# Patient Record
Sex: Female | Born: 1945 | Race: Black or African American | Hispanic: No | Smoking: Former smoker
Health system: Southern US, Community
[De-identification: ages and names within clinical notes are randomized; demographics above are authoritative.]

## PROBLEM LIST (undated history)

## (undated) DIAGNOSIS — G4730 Sleep apnea, unspecified: Secondary | ICD-10-CM

## (undated) DIAGNOSIS — Z8616 Personal history of COVID-19: Secondary | ICD-10-CM

## (undated) DIAGNOSIS — J9610 Chronic respiratory failure, unspecified whether with hypoxia or hypercapnia: Secondary | ICD-10-CM

## (undated) DIAGNOSIS — I330 Acute and subacute infective endocarditis: Secondary | ICD-10-CM

## (undated) DIAGNOSIS — I639 Cerebral infarction, unspecified: Secondary | ICD-10-CM

## (undated) DIAGNOSIS — I2510 Atherosclerotic heart disease of native coronary artery without angina pectoris: Secondary | ICD-10-CM

## (undated) DIAGNOSIS — I5042 Chronic combined systolic (congestive) and diastolic (congestive) heart failure: Secondary | ICD-10-CM

## (undated) DIAGNOSIS — E785 Hyperlipidemia, unspecified: Secondary | ICD-10-CM

## (undated) DIAGNOSIS — I1310 Hypertensive heart and chronic kidney disease without heart failure, with stage 1 through stage 4 chronic kidney disease, or unspecified chronic kidney disease: Secondary | ICD-10-CM

## (undated) DIAGNOSIS — Z8679 Personal history of other diseases of the circulatory system: Secondary | ICD-10-CM

## (undated) DIAGNOSIS — E119 Type 2 diabetes mellitus without complications: Secondary | ICD-10-CM

## (undated) DIAGNOSIS — A419 Sepsis, unspecified organism: Secondary | ICD-10-CM

## (undated) DIAGNOSIS — R32 Unspecified urinary incontinence: Secondary | ICD-10-CM

## (undated) DIAGNOSIS — G8929 Other chronic pain: Secondary | ICD-10-CM

## (undated) DIAGNOSIS — J849 Interstitial pulmonary disease, unspecified: Secondary | ICD-10-CM

## (undated) DIAGNOSIS — R079 Chest pain, unspecified: Secondary | ICD-10-CM

## (undated) DIAGNOSIS — I428 Other cardiomyopathies: Secondary | ICD-10-CM

## (undated) DIAGNOSIS — E669 Obesity, unspecified: Secondary | ICD-10-CM

## (undated) DIAGNOSIS — B955 Unspecified streptococcus as the cause of diseases classified elsewhere: Secondary | ICD-10-CM

## (undated) DIAGNOSIS — I890 Lymphedema, not elsewhere classified: Secondary | ICD-10-CM

## (undated) DIAGNOSIS — M1990 Unspecified osteoarthritis, unspecified site: Secondary | ICD-10-CM

## (undated) DIAGNOSIS — J449 Chronic obstructive pulmonary disease, unspecified: Secondary | ICD-10-CM

## (undated) DIAGNOSIS — I2721 Secondary pulmonary arterial hypertension: Secondary | ICD-10-CM

## (undated) DIAGNOSIS — R6520 Severe sepsis without septic shock: Secondary | ICD-10-CM

## (undated) DIAGNOSIS — I10 Essential (primary) hypertension: Secondary | ICD-10-CM

## (undated) DIAGNOSIS — K2970 Gastritis, unspecified, without bleeding: Secondary | ICD-10-CM

## (undated) DIAGNOSIS — I4819 Other persistent atrial fibrillation: Secondary | ICD-10-CM

## (undated) DIAGNOSIS — E041 Nontoxic single thyroid nodule: Secondary | ICD-10-CM

## (undated) DIAGNOSIS — R001 Bradycardia, unspecified: Secondary | ICD-10-CM

## (undated) DIAGNOSIS — G9341 Metabolic encephalopathy: Secondary | ICD-10-CM

## (undated) DIAGNOSIS — M549 Dorsalgia, unspecified: Secondary | ICD-10-CM

## (undated) DIAGNOSIS — R7881 Bacteremia: Secondary | ICD-10-CM

## (undated) DIAGNOSIS — D171 Benign lipomatous neoplasm of skin and subcutaneous tissue of trunk: Secondary | ICD-10-CM

## (undated) HISTORY — DX: Unspecified streptococcus as the cause of diseases classified elsewhere: B95.5

## (undated) HISTORY — DX: Other cardiomyopathies: I42.8

## (undated) HISTORY — DX: Chronic combined systolic (congestive) and diastolic (congestive) heart failure: I50.42

## (undated) HISTORY — DX: Nontoxic single thyroid nodule: E04.1

## (undated) HISTORY — PX: INSERT / REPLACE / REMOVE PACEMAKER: SUR710

## (undated) HISTORY — DX: Type 2 diabetes mellitus without complications: E11.9

## (undated) HISTORY — DX: Benign lipomatous neoplasm of skin and subcutaneous tissue of trunk: D17.1

## (undated) HISTORY — DX: Personal history of other diseases of the circulatory system: Z86.79

## (undated) HISTORY — DX: Gastritis, unspecified, without bleeding: K29.70

## (undated) HISTORY — DX: Other chronic pain: G89.29

## (undated) HISTORY — DX: Hypertensive heart and chronic kidney disease without heart failure, with stage 1 through stage 4 chronic kidney disease, or unspecified chronic kidney disease: I13.10

## (undated) HISTORY — DX: Interstitial pulmonary disease, unspecified: J84.9

## (undated) HISTORY — PX: TRIGGER FINGER RELEASE: SHX641

## (undated) HISTORY — DX: Chest pain, unspecified: R07.9

## (undated) HISTORY — DX: Dorsalgia, unspecified: M54.9

## (undated) HISTORY — DX: Hyperlipidemia, unspecified: E78.5

## (undated) HISTORY — DX: Personal history of COVID-19: Z86.16

## (undated) HISTORY — DX: Secondary pulmonary arterial hypertension: I27.21

## (undated) HISTORY — DX: Unspecified urinary incontinence: R32

## (undated) HISTORY — DX: Lymphedema, not elsewhere classified: I89.0

## (undated) HISTORY — DX: Chronic obstructive pulmonary disease, unspecified: J44.9

## (undated) HISTORY — PX: VAGINAL HYSTERECTOMY: SHX2639

## (undated) HISTORY — DX: Cerebral infarction, unspecified: I63.9

## (undated) HISTORY — DX: Obesity, unspecified: E66.9

## (undated) HISTORY — DX: Sleep apnea, unspecified: G47.30

## (undated) HISTORY — DX: Acute and subacute infective endocarditis: I33.0

## (undated) HISTORY — DX: Bacteremia: R78.81

## (undated) HISTORY — DX: Metabolic encephalopathy: R65.20

## (undated) HISTORY — DX: Unspecified osteoarthritis, unspecified site: M19.90

## (undated) HISTORY — DX: Atherosclerotic heart disease of native coronary artery without angina pectoris: I25.10

## (undated) HISTORY — PX: CHOLECYSTECTOMY: SHX55

## (undated) HISTORY — DX: Sepsis, unspecified organism: G93.41

## (undated) HISTORY — DX: Essential (primary) hypertension: I10

## (undated) HISTORY — DX: Sepsis, unspecified organism: A41.9

---

## 1967-06-24 HISTORY — PX: ABDOMINAL HYSTERECTOMY: SHX81

## 1998-12-29 LAB — HM COLONOSCOPY

## 2000-06-23 HISTORY — PX: CYST REMOVAL TRUNK: SHX6283

## 2002-06-23 LAB — HM MAMMOGRAPHY

## 2006-01-28 ENCOUNTER — Ambulatory Visit: Payer: Self-pay | Admitting: Family Medicine

## 2006-08-09 ENCOUNTER — Emergency Department: Payer: Self-pay | Admitting: Emergency Medicine

## 2006-08-09 ENCOUNTER — Other Ambulatory Visit: Payer: Self-pay

## 2008-07-21 ENCOUNTER — Ambulatory Visit: Payer: Self-pay | Admitting: Surgery

## 2011-06-24 HISTORY — PX: CARDIAC CATHETERIZATION: SHX172

## 2011-06-24 LAB — HM COLONOSCOPY

## 2012-01-15 ENCOUNTER — Encounter: Payer: Self-pay | Admitting: Cardiovascular Disease

## 2012-01-22 ENCOUNTER — Encounter: Payer: Self-pay | Admitting: Cardiovascular Disease

## 2012-01-22 ENCOUNTER — Ambulatory Visit (INDEPENDENT_AMBULATORY_CARE_PROVIDER_SITE_OTHER): Payer: Medicare Other | Admitting: Cardiovascular Disease

## 2012-01-22 VITALS — BP 152/78 | HR 68 | Ht 61.0 in | Wt 219.5 lb

## 2012-01-22 DIAGNOSIS — R609 Edema, unspecified: Secondary | ICD-10-CM | POA: Insufficient documentation

## 2012-01-22 DIAGNOSIS — I209 Angina pectoris, unspecified: Secondary | ICD-10-CM

## 2012-01-22 DIAGNOSIS — R079 Chest pain, unspecified: Secondary | ICD-10-CM

## 2012-01-22 DIAGNOSIS — I509 Heart failure, unspecified: Secondary | ICD-10-CM

## 2012-01-22 DIAGNOSIS — R0602 Shortness of breath: Secondary | ICD-10-CM

## 2012-01-22 DIAGNOSIS — E785 Hyperlipidemia, unspecified: Secondary | ICD-10-CM | POA: Insufficient documentation

## 2012-01-22 DIAGNOSIS — I10 Essential (primary) hypertension: Secondary | ICD-10-CM | POA: Insufficient documentation

## 2012-01-22 DIAGNOSIS — I5022 Chronic systolic (congestive) heart failure: Secondary | ICD-10-CM | POA: Insufficient documentation

## 2012-01-22 DIAGNOSIS — I208 Other forms of angina pectoris: Secondary | ICD-10-CM | POA: Insufficient documentation

## 2012-01-22 DIAGNOSIS — I2089 Other forms of angina pectoris: Secondary | ICD-10-CM | POA: Insufficient documentation

## 2012-01-22 MED ORDER — CARVEDILOL 6.25 MG PO TABS: 6.2500 mg | ORAL_TABLET | Freq: Two times a day (BID) | ORAL | Status: DC

## 2012-01-22 NOTE — Assessment & Plan Note (Signed)
The patient has unilateral nonpitting edema in the left leg with multiple negative ultrasound evaluation for DVT. I suspect that this is likely due to lymphedema.

## 2012-01-22 NOTE — Assessment & Plan Note (Addendum)
The patient reports previous history of congestive heart failure. She does have symptoms suggestive of fluid overload. I will obtain an echocardiogram before the cardiac catheterization to see if a right heart catheterization is needed at the same time. She appears to be fluid overloaded but will hold off on aggressive diuresis for now until after cardiac catheterization.

## 2012-01-22 NOTE — Assessment & Plan Note (Signed)
The patient reports progressive symptoms of chest tightness and dyspnea which is currently happening with minimal activities. This is suggestive of class III angina which has been progressive. She is not able to undergo stress testing Due to severe arthritis and limited mobility. She is not able to have a pharmacologic nuclear stress test which according to her almost "killed her" last time she had it.  I think the best option is proceeding with cardiac catheterization and possible coronary intervention. Risks, benefits and alternatives were discussed with the patient.

## 2012-01-22 NOTE — Progress Notes (Signed)
HPI  This is a 66 year old female who was referred by Dr. Lutricia Feil for preoperative cardiovascular evaluation for a routine screening colonoscopy. The patient reports previous history of congestive heart failure diagnosed in 2005. At that time she was evaluated and treated at [facility]. She did not continue her followup there due to loss of medical insurance. She was told at that time that her heart function was weak. She had cardiac catheterization few years before that and was told that there was no significant "blockages". She has multiple chronic medical conditions that include hypertension, type 2 diabetes, sleep apnea and obesity. She reports chronic left lower extremity swelling which has been evaluated in the past with venous duplex ultrasound on multiple occasions and was negative for DVT. She reports symptoms of severe dyspnea and chest tightness with minimal activities even walking 20 yards on flat level. She cannot go up 5 steps without having to stop and rest. She's been having these symptoms for the last 2 years but has definitely progressed recently. She also reports orthopnea with associated chest tightness when she is lying down. She's been having headache and dizziness and her blood pressure has been elevated.  Allergies  Allergen Reactions  . Penicillins      Current Outpatient Prescriptions on File Prior to Visit  Medication Sig Dispense Refill  . Aclidinium Bromide (TUDORZA PRESSAIR) 400 MCG/ACT AEPB Inhale into the lungs as needed.      [name] albuterol (PROVENTIL HFA;VENTOLIN HFA) 108 (90 BASE) MCG/ACT inhaler Inhale 2 puffs into the lungs as needed.      [name] aspirin 81 MG tablet Take 81 mg by mouth daily.      . enalapril (VASOTEC) 20 MG tablet Take 20 mg by mouth daily.      . meloxicam (MOBIC) 7.5 MG tablet Take 7.5 mg by mouth as needed.       . metFORMIN (GLUMETZA) 500 MG (MOD) 24 hr tablet Takes 2 tablets twice daily.      . roflumilast (DALIRESP) 500  MCG TABS tablet Take 500 mcg by mouth as needed.       . simvastatin (ZOCOR) 20 MG tablet Take 20 mg by mouth daily.      . carvedilol (COREG) 6.25 MG tablet Take 1 tablet (6.25 mg total) by mouth 2 (two) times daily.  60 tablet  6     Past Medical History  Diagnosis Date  . COPD (chronic obstructive pulmonary disease)   . Thyroid disorder   . Diabetes mellitus     type II  . Thyroid nodule   . CHF (congestive heart failure)   . Sleep apnea   . Obesity   . Hypertension   . Hyperlipidemia      Past Surgical History  Procedure Date  . Cholecystectomy   . Vaginal hysterectomy   . Cardiac catheterization 2002-3    @ [facility];[place]     History reviewed. No pertinent family history.   History   Social History  . Marital Status: Divorced    Spouse Name: N/A    Number of Children: N/A  . Years of Education: N/A   Occupational History  . Not on file.   Social History Main Topics  . Smoking status: Never Smoker   . Smokeless tobacco: Not on file  . Alcohol Use: No  . Drug Use: No  . Sexually Active:    Other Topics Concern  . Not on file   Social History Narrative  . No  narrative on file     ROS Constitutional: Negative for fever, chills, diaphoresis. HENT: Negative for hearing loss, nosebleeds, congestion, sore throat, facial swelling, drooling, trouble swallowing, neck pain, voice change, sinus pressure and tinnitus.  Eyes: Negative for photophobia, pain, discharge and visual disturbance.  Respiratory: Negative for apnea, cough, wheezing.  Cardiovascular: Negative for palpitations . Gastrointestinal: Negative for nausea, vomiting, abdominal pain, diarrhea, constipation, blood in stool and abdominal distention.  Genitourinary: Negative for dysuria, urgency, frequency, hematuria and decreased urine volume.  Skin: Negative for color change, pallor, rash and wound.  Neurological: Negative for dizziness, tremors, seizures, syncope, speech difficulty, weakness,  light-headedness, numbness and headaches.  Psychiatric/Behavioral: Negative for suicidal ideas, hallucinations, behavioral problems and agitation. The patient is not nervous/anxious.     PHYSICAL EXAM   BP 152/78  Pulse 68  Ht 5\' 1"  (1.549 m)  Wt 219 lb 8 oz (99.565 kg)  BMI 41.47 kg/m2 Constitutional: She is oriented to person, place, and time. She appears well-developed and well-nourished. No distress.  HENT: No nasal discharge.  Head: Normocephalic and atraumatic.  Eyes: Pupils are equal and round. Right eye exhibits no discharge. Left eye exhibits no discharge.  Neck: Normal range of motion. Neck supple. No JVD present. No thyromegaly present.  Cardiovascular: Normal rate, regular rhythm, normal heart sounds. Exam reveals no gallop and no friction rub. No murmur heard.  Pulmonary/Chest: Effort normal and breath sounds normal. No stridor. No respiratory distress. She has no wheezes. She has no rales. She exhibits no tenderness.  Abdominal: Soft. Bowel sounds are normal. She exhibits no distension. There is no tenderness. There is no rebound and no guarding.  Musculoskeletal: Normal range of motion. She exhibits significant edema in the left leg which is mostly nonpitting. No significant edema on the right side.  Neurological: She is alert and oriented to person, place, and time. Coordination normal.  Skin: Skin is warm and dry. No rash noted. She is not diaphoretic. No erythema. No pallor.  Psychiatric: She has a normal mood and affect. Her behavior is normal. Judgment and thought content normal.     EKG: Sinus  Rhythm  - occasional ectopic ventricular beat    -Nonspecific ST depression   +   Nonspecific T-abnormality  -Nondiagnostic.     ASSESSMENT AND PLAN

## 2012-01-22 NOTE — Patient Instructions (Addendum)
Start Coreg 6.25 mg twice daily for high blood pressure.   Your physician has requested that you have an echocardiogram. Echocardiography is a painless test that uses sound waves to create images of your heart. It provides your doctor with information about the size and shape of your heart and how well your heart's chambers and valves are working. This procedure takes approximately one hour. There are no restrictions for this procedure.  Your physician has requested that you have a cardiac catheterization. Cardiac catheterization is used to diagnose and/or treat various heart conditions. Doctors may recommend this procedure for a number of different reasons. The most common reason is to evaluate chest pain. Chest pain can be a symptom of coronary artery disease (CAD), and cardiac catheterization can show whether plaque is narrowing or blocking your heart's arteries. This procedure is also used to evaluate the valves, as well as measure the blood flow and oxygen levels in different parts of your heart. For further information please visit [email or web address]. Please follow instruction sheet, as given.  Schedule with Dr. Derrel Nip or Dr. Gilford Rile as new PCP.

## 2012-01-22 NOTE — Assessment & Plan Note (Addendum)
Her blood pressure is elevated. I will add carvedilol 6.25 mg twice daily. The patient requested referral to a new primary care physician and she was given information upon her request.

## 2012-01-23 ENCOUNTER — Other Ambulatory Visit: Payer: Self-pay

## 2012-01-23 ENCOUNTER — Other Ambulatory Visit: Payer: Self-pay | Admitting: Cardiovascular Disease

## 2012-01-23 DIAGNOSIS — R0602 Shortness of breath: Secondary | ICD-10-CM

## 2012-01-23 DIAGNOSIS — R079 Chest pain, unspecified: Secondary | ICD-10-CM

## 2012-01-23 LAB — BASIC METABOLIC PANEL
BUN/Creatinine Ratio: 19 (ref 11–26)
BUN: 11 mg/dL (ref 8–27)
CO2: 25 mmol/L (ref 19–28)
Calcium: 9 mg/dL (ref 8.6–10.2)
Chloride: 100 mmol/L (ref 97–108)
Creatinine, Ser: 0.59 mg/dL (ref 0.57–1.00)
GFR calc Af Amer: 109 mL/min/{1.73_m2} (ref >59)
GFR calc non Af Amer: 94 mL/min/{1.73_m2} (ref >59)
Glucose: 177 mg/dL — ABNORMAL HIGH (ref 65–99)
Potassium: 3 mmol/L — ABNORMAL LOW (ref 3.5–5.2)
Sodium: 139 mmol/L (ref 134–144)

## 2012-01-23 LAB — CBC WITH DIFFERENTIAL/PLATELET
Basophils Absolute: 0 10*3/uL (ref 0.0–0.2)
Basos: 1 % (ref 0–3)
Eos: 1 % (ref 0–7)
Eosinophils Absolute: 0.1 10*3/uL (ref 0.0–0.4)
HCT: 37.6 % (ref 34.0–46.6)
Hemoglobin: 12.1 g/dL (ref 11.1–15.9)
Immature Grans (Abs): 0 10*3/uL (ref 0.0–0.1)
Immature Granulocytes: 0 % (ref 0–2)
Lymphocytes Absolute: 1.7 10*3/uL (ref 0.7–4.5)
Lymphs: 27 % (ref 14–46)
MCH: 27.1 pg (ref 26.6–33.0)
MCHC: 32.2 g/dL (ref 31.5–35.7)
MCV: 84 fL (ref 79–97)
Monocytes Absolute: 0.3 10*3/uL (ref 0.1–1.0)
Monocytes: 5 % (ref 4–13)
Neutrophils Absolute: 4 10*3/uL (ref 1.8–7.8)
Neutrophils Relative %: 66 % (ref 40–74)
RBC: 4.47 x10E6/uL (ref 3.77–5.28)
RDW: 13.9 % (ref 12.3–15.4)
WBC: 6.1 10*3/uL (ref 4.0–10.5)

## 2012-01-23 LAB — PROTIME-INR
INR: 1 (ref 0.8–1.2)
Prothrombin Time: 10.4 s (ref 9.1–12.0)

## 2012-01-23 MED ORDER — POTASSIUM CHLORIDE CRYS ER 20 MEQ PO TBCR: 20.0000 meq | EXTENDED_RELEASE_TABLET | Freq: Every day | ORAL | Status: DC

## 2012-01-26 ENCOUNTER — Other Ambulatory Visit: Payer: Self-pay

## 2012-01-26 ENCOUNTER — Other Ambulatory Visit (INDEPENDENT_AMBULATORY_CARE_PROVIDER_SITE_OTHER): Payer: Medicare Other

## 2012-01-26 DIAGNOSIS — R072 Precordial pain: Secondary | ICD-10-CM

## 2012-01-26 DIAGNOSIS — R0989 Other specified symptoms and signs involving the circulatory and respiratory systems: Secondary | ICD-10-CM

## 2012-01-26 DIAGNOSIS — R0609 Other forms of dyspnea: Secondary | ICD-10-CM

## 2012-01-26 DIAGNOSIS — R0602 Shortness of breath: Secondary | ICD-10-CM

## 2012-01-27 ENCOUNTER — Ambulatory Visit: Payer: Self-pay | Admitting: Cardiovascular Disease

## 2012-01-27 DIAGNOSIS — R0602 Shortness of breath: Secondary | ICD-10-CM

## 2012-01-28 ENCOUNTER — Encounter: Payer: Self-pay | Admitting: Cardiovascular Disease

## 2012-02-05 ENCOUNTER — Ambulatory Visit (INDEPENDENT_AMBULATORY_CARE_PROVIDER_SITE_OTHER): Payer: Medicare Other | Admitting: Cardiovascular Disease

## 2012-02-05 ENCOUNTER — Encounter: Payer: Self-pay | Admitting: Cardiovascular Disease

## 2012-02-05 VITALS — BP 170/92 | HR 62 | Ht 61.0 in | Wt 221.5 lb

## 2012-02-05 DIAGNOSIS — I208 Other forms of angina pectoris: Secondary | ICD-10-CM

## 2012-02-05 DIAGNOSIS — I209 Angina pectoris, unspecified: Secondary | ICD-10-CM

## 2012-02-05 DIAGNOSIS — I10 Essential (primary) hypertension: Secondary | ICD-10-CM

## 2012-02-05 DIAGNOSIS — R609 Edema, unspecified: Secondary | ICD-10-CM

## 2012-02-05 DIAGNOSIS — I509 Heart failure, unspecified: Secondary | ICD-10-CM

## 2012-02-05 MED ORDER — ENALAPRIL MALEATE 20 MG PO TABS: 20.0000 mg | ORAL_TABLET | Freq: Two times a day (BID) | ORAL | Status: DC

## 2012-02-05 MED ORDER — SPIRONOLACTONE 25 MG PO TABS: 25.0000 mg | ORAL_TABLET | Freq: Every day | ORAL | Status: DC

## 2012-02-05 NOTE — Assessment & Plan Note (Signed)
Cardiac catheterization showed no significant obstructive coronary artery disease. I suspect that her symptoms are related to severe diastolic dysfunction with elevated filling pressures. Once her blood pressure is controlled, she can undergo colonoscopy at an overall low risk.

## 2012-02-05 NOTE — Assessment & Plan Note (Signed)
The patient has unilateral nonpitting edema in the left leg with multiple negative ultrasound evaluation for DVT. I suspect that this is likely due to lymphedema.

## 2012-02-05 NOTE — Assessment & Plan Note (Signed)
This is due to diastolic dysfunction with moderate to severe pulmonary hypertension likely related to prolonged history of systemic hypertension. I recommend adding spironolactone 25 mg once daily. If her blood pressure remains elevated, a thiazide diuretic can be considered.

## 2012-02-05 NOTE — Assessment & Plan Note (Addendum)
Her blood pressure is still very elevated. I recommend increasing enalapril to twice daily and adding spironolactone as outlined above. She will need an office visit in one week to recheck her blood pressure and will also need basic metabolic profile.

## 2012-02-05 NOTE — Progress Notes (Signed)
HPI  This is a 66 year old female who was here today for a followup visit. She was referred by Dr. Lutricia Feil for preoperative cardiovascular evaluation for a routine screening colonoscopy.  She has chronic left lower extremity swelling which has been evaluated in the past with venous duplex ultrasound on multiple occasions and was negative for DVT. She reported symptoms of severe dyspnea and chest tightness with minimal activities even walking 20 yards on flat level. She cannot go up 5 steps without having to stop and rest. She's been having these symptoms for the last 2 years but has definitely progressed recently. She also reports orthopnea with associated chest tightness when she is lying down. She had an echocardiogram done which showed normal LV systolic function with mild diastolic dysfunction and evidence of elevated filling pressures. There was also moderate to severe pulmonary hypertension with peak systolic pressure of 56 mm Hg. she underwent cardiac catheterization which showed no evidence of obstructive coronary artery disease. She was noted to be severely hypertensive with significant elevation in left ventricular end-diastolic pressure. She was noted to be hypokalemic with a potassium of 3 on her precath labs.  Allergies  Allergen Reactions  . Penicillins      Current Outpatient Prescriptions on File Prior to Visit  Medication Sig Dispense Refill  . Aclidinium Bromide (TUDORZA PRESSAIR) 400 MCG/ACT AEPB Inhale into the lungs as needed.      [name] albuterol (PROVENTIL HFA;VENTOLIN HFA) 108 (90 BASE) MCG/ACT inhaler Inhale 2 puffs into the lungs as needed.      [name] aspirin 81 MG tablet Take 81 mg by mouth daily.      . carvedilol (COREG) 6.25 MG tablet Take 1 tablet (6.25 mg total) by mouth 2 (two) times daily.  60 tablet  6  . meloxicam (MOBIC) 7.5 MG tablet Take 7.5 mg by mouth as needed.       . metFORMIN (GLUMETZA) 500 MG (MOD) 24 hr tablet Takes 2 tablets twice daily.      . NON  FORMULARY Oxygen 2 liters.      . NON FORMULARY CPAP      . roflumilast (DALIRESP) 500 MCG TABS tablet Take 500 mcg by mouth as needed.       . simvastatin (ZOCOR) 20 MG tablet Take 20 mg by mouth daily.      [name] DISCONTD: enalapril (VASOTEC) 20 MG tablet Take 20 mg by mouth daily.      [name] spironolactone (ALDACTONE) 25 MG tablet Take 1 tablet (25 mg total) by mouth daily.  30 tablet  6     Past Medical History  Diagnosis Date  . COPD (chronic obstructive pulmonary disease)   . Thyroid disorder   . Diabetes mellitus     type II  . Thyroid nodule   . CHF (congestive heart failure)   . Sleep apnea   . Obesity   . Hypertension   . Hyperlipidemia      Past Surgical History  Procedure Date  . Cholecystectomy   . Vaginal hysterectomy   . Cardiac catheterization 2013    @ [place]: No obstructive CAD: Only 20% ostial left circumflex.     History reviewed. No pertinent family history.   History   Social History  . Marital Status: Divorced    Spouse Name: N/A    Number of Children: N/A  . Years of Education: N/A   Occupational History  . Not on file.   Social History Main Topics  . Smoking status:  Never Smoker   . Smokeless tobacco: Not on file  . Alcohol Use: No  . Drug Use: No  . Sexually Active:    Other Topics Concern  . Not on file   Social History Narrative  . No narrative on file     PHYSICAL EXAM   BP 170/92  Pulse 62  Ht 5\' 1"  (1.549 m)  Wt 221 lb 8 oz (100.472 kg)  BMI 41.85 kg/m2  SpO2 98%  Constitutional: He is oriented to person, place, and time. He appears well-developed and well-nourished. No distress.  HENT: No nasal discharge.  Head: Normocephalic and atraumatic.  Eyes: Pupils are equal and round. Right eye exhibits no discharge. Left eye exhibits no discharge.  Neck: Normal range of motion. Neck supple. No JVD present. No thyromegaly present.  Cardiovascular: Normal rate, regular rhythm, normal heart sounds and. Exam reveals no gallop and  no friction rub. There is 2/6 systolic ejection murmur at the aortic area. Pulmonary/Chest: Effort normal and breath sounds normal. No stridor. No respiratory distress. He has no wheezes. He has no rales. He exhibits no tenderness.  Abdominal: Soft. Bowel sounds are normal. He exhibits no distension. There is no tenderness. There is no rebound and no guarding.  Musculoskeletal: Normal range of motion. He exhibits chronic +3 nonpitting edema on the left side.   Neurological: He is alert and oriented to person, place, and time. Coordination normal.  Skin: Skin is warm and dry. No rash noted. He is not diaphoretic. No erythema. No pallor.  Psychiatric: He has a normal mood and affect. His behavior is normal. Judgment and thought content normal.     EKG: Sinus  Rhythm  -  Nonspecific T-abnormality.    ASSESSMENT AND PLAN

## 2012-02-05 NOTE — Patient Instructions (Addendum)
Change Enalapril to 20 mg twice daily.  Start Spironolactone 25 mg once daily.  Follow up in 1 week.

## 2012-02-13 ENCOUNTER — Encounter: Payer: Self-pay | Admitting: Cardiovascular Disease

## 2012-02-13 ENCOUNTER — Ambulatory Visit (INDEPENDENT_AMBULATORY_CARE_PROVIDER_SITE_OTHER): Payer: Medicare Other | Admitting: Cardiovascular Disease

## 2012-02-13 VITALS — BP 160/90 | HR 76 | Ht 61.0 in | Wt 221.5 lb

## 2012-02-13 DIAGNOSIS — I10 Essential (primary) hypertension: Secondary | ICD-10-CM

## 2012-02-13 DIAGNOSIS — R0789 Other chest pain: Secondary | ICD-10-CM

## 2012-02-13 DIAGNOSIS — I509 Heart failure, unspecified: Secondary | ICD-10-CM

## 2012-02-13 MED ORDER — FUROSEMIDE 20 MG PO TABS: 20.0000 mg | ORAL_TABLET | Freq: Two times a day (BID) | ORAL | Status: DC

## 2012-02-13 NOTE — Patient Instructions (Addendum)
Start Furosemide (Lasix)  20 mg twice daily . This is a fluid medication. Take in early afternoon around 3 pm.  Continue other medications.  Labs to be done in 1 week.  Follow up in 1 month.

## 2012-02-13 NOTE — Progress Notes (Signed)
HPI  This is a 66 year old female who was here today for a followup visit. She was referred by Dr. Lutricia Feil for preoperative cardiovascular evaluation for a routine screening colonoscopy. She has chronic left lower extremity swelling which has been evaluated in the past with venous duplex ultrasound on multiple occasions and was negative for DVT. She reported symptoms of severe dyspnea and chest tightness with minimal activities even walking 20 yards on flat level. She cannot go up 5 steps without having to stop and rest. She's been having these symptoms for the last 2 years but has definitely progressed recently. She also reports orthopnea with associated chest tightness when she is lying down. She had an echocardiogram done which showed normal LV systolic function with mild diastolic dysfunction and evidence of elevated filling pressures. There was also moderate to severe pulmonary hypertension with peak systolic pressure of 56 mm Hg. she underwent cardiac catheterization which showed no evidence of obstructive coronary artery disease. She was noted to be severely hypertensive with significant elevation in left ventricular end-diastolic pressure. She was noted to be hypokalemic with a potassium of 3 on her precath labs. During her last visit, spironolactone 25 mg once daily was added. She still feels short of breath with dizziness. Also with substernal chest tightness with activities.   Allergies  Allergen Reactions  . Penicillins      Current Outpatient Prescriptions on File Prior to Visit  Medication Sig Dispense Refill  . Aclidinium Bromide (TUDORZA PRESSAIR) 400 MCG/ACT AEPB Inhale into the lungs as needed.      [name] albuterol (PROVENTIL HFA;VENTOLIN HFA) 108 (90 BASE) MCG/ACT inhaler Inhale 2 puffs into the lungs as needed.      [name] aspirin 81 MG tablet Take 81 mg by mouth daily.      . carvedilol (COREG) 6.25 MG tablet Take 1 tablet (6.25 mg total) by mouth 2 (two) times daily.  60 tablet  6    . enalapril (VASOTEC) 20 MG tablet Take 1 tablet (20 mg total) by mouth 2 (two) times daily.  60 tablet  6  . meloxicam (MOBIC) 7.5 MG tablet Take 7.5 mg by mouth as needed.       . metFORMIN (GLUMETZA) 500 MG (MOD) 24 hr tablet Takes 2 tablets twice daily.      . NON FORMULARY Oxygen 2 liters.      . NON FORMULARY CPAP      . roflumilast (DALIRESP) 500 MCG TABS tablet Take 500 mcg by mouth as needed.       . simvastatin (ZOCOR) 20 MG tablet Take 20 mg by mouth daily.      [name] spironolactone (ALDACTONE) 25 MG tablet Take 1 tablet (25 mg total) by mouth daily.  30 tablet  6     Past Medical History  Diagnosis Date  . COPD (chronic obstructive pulmonary disease)   . Thyroid disorder   . Diabetes mellitus     type II  . Thyroid nodule   . CHF (congestive heart failure)   . Sleep apnea   . Obesity   . Hypertension   . Hyperlipidemia      Past Surgical History  Procedure Date  . Cholecystectomy   . Vaginal hysterectomy   . Cardiac catheterization 2013    @ [place]: No obstructive CAD: Only 20% ostial left circumflex.     History reviewed. No pertinent family history.   History   Social History  . Marital Status: Divorced    Spouse Name:  N/A    Number of Children: N/A  . Years of Education: N/A   Occupational History  . Not on file.   Social History Main Topics  . Smoking status: Never Smoker   . Smokeless tobacco: Not on file  . Alcohol Use: No  . Drug Use: No  . Sexually Active:    Other Topics Concern  . Not on file   Social History Narrative  . No narrative on file     PHYSICAL EXAM   BP 160/90  Pulse 76  Ht 5\' 1"  (1.549 m)  Wt 221 lb 8 oz (100.472 kg)  BMI 41.85 kg/m2 Constitutional: She is oriented to person, place, and time. She appears well-developed and well-nourished. No distress.  HENT: No nasal discharge.  Head: Normocephalic and atraumatic.  Eyes: Pupils are equal and round. Right eye exhibits no discharge. Left eye exhibits no discharge.   Neck: Normal range of motion. Neck supple. No JVD present. No thyromegaly present.  Cardiovascular: Normal rate, regular rhythm, normal heart sounds. Exam reveals no gallop and no friction rub. No murmur heard.  Pulmonary/Chest: Effort normal and breath sounds normal. No stridor. No respiratory distress. She has no wheezes. She has no rales. She exhibits no tenderness.  Abdominal: Soft. Bowel sounds are normal. She exhibits no distension. There is no tenderness. There is no rebound and no guarding.  Musculoskeletal: Normal range of motion. She exhibits chronic left lower extremity edema and no tenderness.  Neurological: She is alert and oriented to person, place, and time. Coordination normal.  Skin: Skin is warm and dry. No rash noted. She is not diaphoretic. No erythema. No pallor.  Psychiatric: She has a normal mood and affect. Her behavior is normal. Judgment and thought content normal.     EKG: Normal sinus rhythm with nonspecific T wave changes. Heart rate is 64 beats per minute.   ASSESSMENT AND PLAN

## 2012-02-13 NOTE — Assessment & Plan Note (Signed)
This is due to diastolic dysfunction with moderate to severe pulmonary hypertension likely related to prolonged history of systemic hypertension. Continue spironolactone 25 mg once daily. I will add furosemide 20 mg twice daily. Check basic metabolic profile in one week.

## 2012-02-13 NOTE — Assessment & Plan Note (Signed)
Blood pressure is slightly better than last visit. Due to her complaints of dizziness, I will try to drink the blood pressure down very gradually. Hopefully, furosemide twice daily will help.

## 2012-02-19 ENCOUNTER — Other Ambulatory Visit (INDEPENDENT_AMBULATORY_CARE_PROVIDER_SITE_OTHER): Payer: Medicare Other

## 2012-02-19 DIAGNOSIS — I509 Heart failure, unspecified: Secondary | ICD-10-CM

## 2012-02-20 LAB — BASIC METABOLIC PANEL
BUN/Creatinine Ratio: 20 (ref 11–26)
BUN: 15 mg/dL (ref 8–27)
CO2: 24 mmol/L (ref 19–28)
Calcium: 8.9 mg/dL (ref 8.6–10.2)
Chloride: 100 mmol/L (ref 97–108)
Creatinine, Ser: 0.76 mg/dL (ref 0.57–1.00)
GFR calc Af Amer: 93 mL/min/{1.73_m2} (ref >59)
GFR calc non Af Amer: 81 mL/min/{1.73_m2} (ref >59)
Glucose: 116 mg/dL — ABNORMAL HIGH (ref 65–99)
Potassium: 3.8 mmol/L (ref 3.5–5.2)
Sodium: 140 mmol/L (ref 134–144)

## 2012-03-15 ENCOUNTER — Ambulatory Visit (INDEPENDENT_AMBULATORY_CARE_PROVIDER_SITE_OTHER): Payer: Medicare Other | Admitting: Cardiovascular Disease

## 2012-03-15 ENCOUNTER — Encounter: Payer: Self-pay | Admitting: Cardiovascular Disease

## 2012-03-15 VITALS — BP 170/90 | HR 64 | Ht 61.0 in | Wt 221.2 lb

## 2012-03-15 DIAGNOSIS — I209 Angina pectoris, unspecified: Secondary | ICD-10-CM

## 2012-03-15 DIAGNOSIS — I208 Other forms of angina pectoris: Secondary | ICD-10-CM

## 2012-03-15 DIAGNOSIS — I10 Essential (primary) hypertension: Secondary | ICD-10-CM

## 2012-03-15 DIAGNOSIS — I509 Heart failure, unspecified: Secondary | ICD-10-CM

## 2012-03-15 DIAGNOSIS — R0789 Other chest pain: Secondary | ICD-10-CM

## 2012-03-15 MED ORDER — ISOSORBIDE MONONITRATE ER 60 MG PO TB24: 60.0000 mg | ORAL_TABLET | Freq: Every day | ORAL | Status: DC

## 2012-03-15 MED ORDER — AMLODIPINE BESYLATE 5 MG PO TABS: 5.0000 mg | ORAL_TABLET | Freq: Every day | ORAL | Status: DC

## 2012-03-15 NOTE — Patient Instructions (Addendum)
Labs today.  Make sure you stick to low Sodium diet.   Start Amlodipine 5 mg once daily.  Start Imdur 60 mg once daily.  Follow up in 1 month.

## 2012-03-15 NOTE — Assessment & Plan Note (Signed)
Her blood pressure is still significantly high. I will add today Imdur 60 mg once daily and amlodipine 5 mg once daily. The patient might be a candidate for renal denervation in the future. I also discussed with her the importance of low sodium diet.

## 2012-03-15 NOTE — Progress Notes (Signed)
HPI  This is a 66 year old female who was here today for a followup visit regarding chronic diastolic heart failure with pulmonary hypertension. She has chronic left lower extremity swelling which has been evaluated in the past with venous duplex ultrasound on multiple occasions and was negative for DVT. She continues to have symptoms of severe dyspnea and chest tightness with minimal activities even walking 20 yards on flat level. She cannot go up 5 steps without having to stop and rest. She's been having these symptoms for the last 2 years but has definitely progressed recently. She also reports orthopnea with associated chest tightness when she is lying down. She had an echocardiogram done which showed normal LV systolic function with mild diastolic dysfunction and evidence of elevated filling pressures. There was also moderate to severe pulmonary hypertension with peak systolic pressure of 56 mm Hg. she underwent cardiac catheterization which showed no evidence of obstructive coronary artery disease. She was noted to be severely hypertensive with significant elevation in left ventricular end-diastolic pressure. She was noted to be hypokalemic with a potassium of 3 on her precath labs. Spironolactone 25 mg once daily was added. Lasix 20 mg twice daily was added during last visit. The patient reports frequent urination but there has been no significant change in her weight. Lower extremity edema though seems to be better. She has been having significant issues with exertional dyspnea, chest pain and fatigue. She feels that she is gasping for air. She is supposed to be on home oxygen but does not have a portable oxygen tank.     Allergies  Allergen Reactions  . Penicillins      Current Outpatient Prescriptions on File Prior to Visit  Medication Sig Dispense Refill  . Aclidinium Bromide (TUDORZA PRESSAIR) 400 MCG/ACT AEPB Inhale into the lungs as needed.      [name] albuterol (PROVENTIL HFA;VENTOLIN  HFA) 108 (90 BASE) MCG/ACT inhaler Inhale 2 puffs into the lungs as needed.      [name] aspirin 81 MG tablet Take 81 mg by mouth daily.      . carvedilol (COREG) 6.25 MG tablet Take 1 tablet (6.25 mg total) by mouth 2 (two) times daily.  60 tablet  6  . enalapril (VASOTEC) 20 MG tablet Take 1 tablet (20 mg total) by mouth 2 (two) times daily.  60 tablet  6  . furosemide (LASIX) 20 MG tablet Take 1 tablet (20 mg total) by mouth 2 (two) times daily.  60 tablet  6  . meloxicam (MOBIC) 7.5 MG tablet Take 7.5 mg by mouth as needed.       . metFORMIN (GLUMETZA) 500 MG (MOD) 24 hr tablet Takes 2 tablets twice daily.      . NON FORMULARY CPAP      . roflumilast (DALIRESP) 500 MCG TABS tablet Take 500 mcg by mouth as needed.       . simvastatin (ZOCOR) 20 MG tablet Take 20 mg by mouth daily.      [name] spironolactone (ALDACTONE) 25 MG tablet Take 1 tablet (25 mg total) by mouth daily.  30 tablet  6  . amLODipine (NORVASC) 5 MG tablet Take 1 tablet (5 mg total) by mouth daily.  30 tablet  6  . isosorbide mononitrate (IMDUR) 60 MG 24 hr tablet Take 1 tablet (60 mg total) by mouth daily.  30 tablet  6  . NON FORMULARY Oxygen 2 liters.         Past Medical History  Diagnosis Date  .  COPD (chronic obstructive pulmonary disease)   . Thyroid disorder   . Diabetes mellitus     type II  . Thyroid nodule   . CHF (congestive heart failure)   . Sleep apnea   . Obesity   . Hypertension   . Hyperlipidemia      Past Surgical History  Procedure Date  . Cholecystectomy   . Vaginal hysterectomy   . Cardiac catheterization 2013    @ [place]: No obstructive CAD: Only 20% ostial left circumflex.     History reviewed. No pertinent family history.   History   Social History  . Marital Status: Divorced    Spouse Name: N/A    Number of Children: N/A  . Years of Education: N/A   Occupational History  . Not on file.   Social History Main Topics  . Smoking status: Never Smoker   . Smokeless tobacco: Not on  file  . Alcohol Use: No  . Drug Use: No  . Sexually Active:    Other Topics Concern  . Not on file   Social History Narrative  . No narrative on file       PHYSICAL EXAM   BP 170/90  Pulse 64  Ht 5\' 1"  (1.549 m)  Wt 221 lb 4 oz (100.358 kg)  BMI 41.80 kg/m2 Constitutional: She is oriented to person, place, and time. She appears well-developed and well-nourished. No distress.  HENT: No nasal discharge.  Head: Normocephalic and atraumatic.  Eyes: Pupils are equal and round. Right eye exhibits no discharge. Left eye exhibits no discharge.  Neck: Normal range of motion. Neck supple. No JVD present. No thyromegaly present.  Cardiovascular: Normal rate, regular rhythm, normal heart sounds. Exam reveals no gallop and no friction rub. No murmur heard.  Pulmonary/Chest: Effort normal and breath sounds normal. No stridor. No respiratory distress. She has no wheezes. She has no rales. She exhibits no tenderness.  Abdominal: Soft. Bowel sounds are normal. She exhibits no distension. There is no tenderness. There is no rebound and no guarding.  Musculoskeletal: Normal range of motion. She exhibits chronic left lower extremity edema and no tenderness.  Neurological: She is alert and oriented to person, place, and time. Coordination normal.  Skin: Skin is warm and dry. No rash noted. She is not diaphoretic. No erythema. No pallor.  Psychiatric: She has a normal mood and affect. Her behavior is normal. Judgment and thought content normal.     EKG: Sinus  Rhythm  - frequent PAC s  # PACs = 2. Low voltage in precordial leads.   -  Nonspecific T-abnormality.   ABNORMAL    ASSESSMENT AND PLAN

## 2012-03-15 NOTE — Assessment & Plan Note (Signed)
Cardiac catheterization showed no significant coronary artery disease. Her symptoms seem to be due to chronic diastolic heart failure with pulmonary hypertension. I will add Imdur 60 mg once daily for symptomatic relief and blood pressure control.

## 2012-03-15 NOTE — Assessment & Plan Note (Signed)
The patient has severe diastolic heart failure with pulmonary hypertension and refractory fluid overload. She continues to be significantly symptomatic. Her oxygen saturation at rest was 97% which quickly dropped to 89% with walking less than 50 feet in our office. I believe Debra Saunders would benefit from continuous oxygen treatment. I will try to arrange for that. Continue current dose of Lasix and spironolactone. She is having some PACs and her EKG and thus I will check basic metabolic profile and magnesium level given that she is on 2 diuretics.

## 2012-03-16 ENCOUNTER — Telehealth: Payer: Self-pay

## 2012-03-16 LAB — BASIC METABOLIC PANEL
BUN/Creatinine Ratio: 28 — ABNORMAL HIGH (ref 11–26)
BUN: 18 mg/dL (ref 8–27)
CO2: 26 mmol/L (ref 19–28)
Calcium: 9.3 mg/dL (ref 8.6–10.2)
Chloride: 98 mmol/L (ref 97–108)
Creatinine, Ser: 0.65 mg/dL (ref 0.57–1.00)
GFR calc Af Amer: 105 mL/min/{1.73_m2} (ref >59)
GFR calc non Af Amer: 92 mL/min/{1.73_m2} (ref >59)
Glucose: 154 mg/dL — ABNORMAL HIGH (ref 65–99)
Potassium: 3.6 mmol/L (ref 3.5–5.2)
Sodium: 141 mmol/L (ref 134–144)

## 2012-03-16 LAB — MAGNESIUM: Magnesium: 1.4 mg/dL — ABNORMAL LOW (ref 1.6–2.6)

## 2012-03-16 NOTE — Telephone Encounter (Signed)
Faxed office note for documentation re: portable O2 to [facility] [phone]

## 2012-03-17 ENCOUNTER — Other Ambulatory Visit: Payer: Self-pay

## 2012-03-17 MED ORDER — MAGNESIUM OXIDE 400 MG PO TABS: 400.0000 mg | ORAL_TABLET | Freq: Two times a day (BID) | ORAL | Status: DC

## 2012-03-30 ENCOUNTER — Encounter: Payer: Self-pay | Admitting: Internal Medicine

## 2012-03-30 ENCOUNTER — Ambulatory Visit (INDEPENDENT_AMBULATORY_CARE_PROVIDER_SITE_OTHER): Payer: Medicare Other | Admitting: Internal Medicine

## 2012-03-30 VITALS — BP 122/80 | HR 56 | Temp 98.5°F | Ht 61.0 in | Wt 219.8 lb

## 2012-03-30 DIAGNOSIS — R600 Localized edema: Secondary | ICD-10-CM

## 2012-03-30 DIAGNOSIS — E039 Hypothyroidism, unspecified: Secondary | ICD-10-CM | POA: Insufficient documentation

## 2012-03-30 DIAGNOSIS — R82998 Other abnormal findings in urine: Secondary | ICD-10-CM

## 2012-03-30 DIAGNOSIS — I2789 Other specified pulmonary heart diseases: Secondary | ICD-10-CM

## 2012-03-30 DIAGNOSIS — Z1239 Encounter for other screening for malignant neoplasm of breast: Secondary | ICD-10-CM

## 2012-03-30 DIAGNOSIS — E785 Hyperlipidemia, unspecified: Secondary | ICD-10-CM

## 2012-03-30 DIAGNOSIS — I509 Heart failure, unspecified: Secondary | ICD-10-CM

## 2012-03-30 DIAGNOSIS — I10 Essential (primary) hypertension: Secondary | ICD-10-CM

## 2012-03-30 DIAGNOSIS — R61 Generalized hyperhidrosis: Secondary | ICD-10-CM

## 2012-03-30 DIAGNOSIS — R609 Edema, unspecified: Secondary | ICD-10-CM

## 2012-03-30 DIAGNOSIS — R820 Chyluria: Secondary | ICD-10-CM

## 2012-03-30 DIAGNOSIS — D649 Anemia, unspecified: Secondary | ICD-10-CM

## 2012-03-30 DIAGNOSIS — E119 Type 2 diabetes mellitus without complications: Secondary | ICD-10-CM | POA: Insufficient documentation

## 2012-03-30 DIAGNOSIS — I272 Pulmonary hypertension, unspecified: Secondary | ICD-10-CM | POA: Insufficient documentation

## 2012-03-30 LAB — POCT URINALYSIS DIPSTICK
Bilirubin, UA: NEGATIVE
Blood, UA: NEGATIVE
Glucose, UA: NEGATIVE
Leukocytes, UA: NEGATIVE
Nitrite, UA: NEGATIVE
Spec Grav, UA: 1.025
Urobilinogen, UA: 0.2
pH, UA: 5.5

## 2012-03-30 LAB — COMPREHENSIVE METABOLIC PANEL
ALT: 22 U/L (ref 0–35)
AST: 29 U/L (ref 0–37)
Albumin: 3.4 g/dL — ABNORMAL LOW (ref 3.5–5.2)
Alkaline Phosphatase: 78 U/L (ref 39–117)
BUN: 18 mg/dL (ref 6–23)
CO2: 26 mEq/L (ref 19–32)
Calcium: 8.7 mg/dL (ref 8.4–10.5)
Chloride: 101 mEq/L (ref 96–112)
Creatinine, Ser: 0.9 mg/dL (ref 0.4–1.2)
GFR: 83.09 mL/min (ref >60.00)
Glucose, Bld: 123 mg/dL — ABNORMAL HIGH (ref 70–99)
Potassium: 3.6 mEq/L (ref 3.5–5.1)
Sodium: 135 mEq/L (ref 135–145)
Total Bilirubin: 0.7 mg/dL (ref 0.3–1.2)
Total Protein: 7.7 g/dL (ref 6.0–8.3)

## 2012-03-30 LAB — CBC WITH DIFFERENTIAL/PLATELET
Basophils Absolute: 0 10*3/uL (ref 0.0–0.1)
Basophils Relative: 0.6 % (ref 0.0–3.0)
Eosinophils Absolute: 0.2 10*3/uL (ref 0.0–0.7)
Eosinophils Relative: 2.5 % (ref 0.0–5.0)
HCT: 33.6 % — ABNORMAL LOW (ref 36.0–46.0)
Hemoglobin: 10.5 g/dL — ABNORMAL LOW (ref 12.0–15.0)
Lymphocytes Relative: 26 % (ref 12.0–46.0)
Lymphs Abs: 1.8 10*3/uL (ref 0.7–4.0)
MCHC: 31.3 g/dL (ref 30.0–36.0)
MCV: 86.4 fl (ref 78.0–100.0)
Monocytes Absolute: 0.4 10*3/uL (ref 0.1–1.0)
Monocytes Relative: 5.7 % (ref 3.0–12.0)
Neutro Abs: 4.5 10*3/uL (ref 1.4–7.7)
Neutrophils Relative %: 65.2 % (ref 43.0–77.0)
Platelets: 245 10*3/uL (ref 150.0–400.0)
RBC: 3.88 Mil/uL (ref 3.87–5.11)
RDW: 14.9 % — ABNORMAL HIGH (ref 11.5–14.6)
WBC: 6.9 10*3/uL (ref 4.5–10.5)

## 2012-03-30 LAB — LIPID PANEL
Cholesterol: 150 mg/dL (ref 0–200)
HDL: 48.6 mg/dL (ref >39.00)
LDL Cholesterol: 80 mg/dL (ref 0–99)
Total CHOL/HDL Ratio: 3
Triglycerides: 108 mg/dL (ref 0.0–149.0)
VLDL: 21.6 mg/dL (ref 0.0–40.0)

## 2012-03-30 LAB — TSH: TSH: 0.83 u[IU]/mL (ref 0.35–5.50)

## 2012-03-30 LAB — MAGNESIUM: Magnesium: 1.7 mg/dL (ref 1.5–2.5)

## 2012-03-30 LAB — HEMOGLOBIN A1C: Hgb A1c MFr Bld: 5.7 % (ref 4.6–6.5)

## 2012-03-30 NOTE — Assessment & Plan Note (Signed)
Will check lipids and LFTs with labs today. Followup one month.

## 2012-03-30 NOTE — Progress Notes (Signed)
Subjective:    Patient ID: Debra Saunders, female    DOB: 1945-08-31, 66 y.o.   MRN: [id number]  HPI 66 year old female with history of pulmonary hypertension, diastolic heart failure, hypertension, hyperlipidemia, COPD presents to establish care. She has several concerns today. First, she reports long history of dyspnea both at rest and on exertion. She reports that her previous PCP was treating her for COPD with various inhalers including Advair and Spiriva. She reports no improvement with these interventions. She is a former smoker and was exposed to secondhand smoke. She does not have history of repeated episodes of bronchitis. She does note a history of sleep apnea for which she uses CPAP. She is also using supplemental oxygen by nasal cannula during the day.  She is also concerned today about persistent left lower leg swelling. This is been ongoing for years. She reports that in the past she was evaluated for DVT in her left leg and this was negative. The swelling is persistent however is much worse in the afternoon. It never completely goes down. She has noticed some thickening of the skin over her left anterior lower leg. She denies pain in her leg.   She is also concerned today about intermittent episodes of diaphoresis. These, without any obvious trigger. They are not associated with chest pain or dyspnea typically. They are associated with fatigue. She notes that she was followed for possible thyroid dysfunction and a thyroid nodule in the past but has not recently had lab work performed.  In regards to diabetes, she reports her blood sugars have been well controlled with use of metformin. She did not bring a record of her blood sugars today. She does not know her last A1c.  Outpatient Encounter Prescriptions as of 03/30/2012  Medication Sig Dispense Refill  . Aclidinium Bromide (TUDORZA PRESSAIR) 400 MCG/ACT AEPB Inhale into the lungs as needed.      [name] albuterol (PROVENTIL HFA;VENTOLIN HFA)  108 (90 BASE) MCG/ACT inhaler Inhale 2 puffs into the lungs as needed.      [name] amLODipine (NORVASC) 5 MG tablet Take 1 tablet (5 mg total) by mouth daily.  30 tablet  6  . aspirin 81 MG tablet Take 81 mg by mouth daily.      . carvedilol (COREG) 6.25 MG tablet Take 1 tablet (6.25 mg total) by mouth 2 (two) times daily.  60 tablet  6  . enalapril (VASOTEC) 20 MG tablet Take 1 tablet (20 mg total) by mouth 2 (two) times daily.  60 tablet  6  . furosemide (LASIX) 20 MG tablet Take 1 tablet (20 mg total) by mouth 2 (two) times daily.  60 tablet  6  . isosorbide mononitrate (IMDUR) 60 MG 24 hr tablet Take 1 tablet (60 mg total) by mouth daily.  30 tablet  6  . magnesium oxide (MAG-OX 400) 400 MG tablet Take 1 tablet (400 mg total) by mouth 2 (two) times daily.  60 tablet  3  . meloxicam (MOBIC) 7.5 MG tablet Take 7.5 mg by mouth as needed.       . metFORMIN (GLUMETZA) 500 MG (MOD) 24 hr tablet Takes 2 tablets twice daily.      . NON FORMULARY Oxygen 2 liters.      . NON FORMULARY CPAP      . simvastatin (ZOCOR) 20 MG tablet Take 20 mg by mouth daily.      [name] spironolactone (ALDACTONE) 25 MG tablet Take 1 tablet (25 mg total) by mouth daily.  30 tablet  6  . DISCONTD: roflumilast (DALIRESP) 500 MCG TABS tablet Take 500 mcg by mouth as needed.         Review of Systems  Constitutional: Negative for fever, chills, appetite change, fatigue and unexpected weight change.  HENT: Negative for ear pain, congestion, sore throat, trouble swallowing, neck pain, voice change and sinus pressure.   Eyes: Negative for visual disturbance.  Respiratory: Negative for cough, shortness of breath, wheezing and stridor.   Cardiovascular: Positive for leg swelling. Negative for chest pain and palpitations.  Gastrointestinal: Negative for nausea, vomiting, abdominal pain, diarrhea, constipation, blood in stool, abdominal distention and anal bleeding.  Genitourinary: Negative for dysuria and flank pain.  Musculoskeletal:  Negative for myalgias, arthralgias and gait problem.  Skin: Negative for color change and rash.  Neurological: Negative for dizziness and headaches.  Hematological: Negative for adenopathy. Does not bruise/bleed easily.  Psychiatric/Behavioral: Negative for suicidal ideas, disturbed wake/sleep cycle and dysphoric mood. The patient is not nervous/anxious.        Objective:   Physical Exam  Constitutional: She is oriented to person, place, and time. She appears well-developed and well-nourished. No distress.  HENT:  Head: Normocephalic and atraumatic.  Right Ear: External ear normal.  Left Ear: External ear normal.  Nose: Nose normal.  Mouth/Throat: Oropharynx is clear and moist. No oropharyngeal exudate.  Eyes: Conjunctivae normal are normal. Pupils are equal, round, and reactive to light. Right eye exhibits no discharge. Left eye exhibits no discharge. No scleral icterus.  Neck: Normal range of motion. Neck supple. No tracheal deviation present. No thyromegaly present.  Cardiovascular: Normal rate, regular rhythm, normal heart sounds and intact distal pulses.  Exam reveals no gallop and no friction rub.   No murmur heard. Pulmonary/Chest: Effort normal and breath sounds normal. No accessory muscle usage. Not tachypneic. No respiratory distress. She has no decreased breath sounds. She has no wheezes. She has no rhonchi. She has no rales. She exhibits no tenderness.  Musculoskeletal: Normal range of motion. She exhibits edema (marked non-pitting edema of left lower extremity to knee). She exhibits no tenderness.  Lymphadenopathy:    She has no cervical adenopathy.  Neurological: She is alert and oriented to person, place, and time. No cranial nerve deficit. She exhibits normal muscle tone. Coordination normal.  Skin: Skin is warm and dry. No rash noted. She is not diaphoretic. No erythema. No pallor.  Psychiatric: She has a normal mood and affect. Her behavior is normal. Judgment and thought  content normal.          Assessment & Plan:  Over 33min spent in face-to-face contact with pt and her daughter discussing medical history and plan for further management.

## 2012-03-30 NOTE — Assessment & Plan Note (Signed)
Pulmonary hypertension based on previous echo. Suspect that this is major contributor to her symptoms of dyspnea on exertion, more so than COPD. Question if she might benefit from medication such as sildenafil to help with symptoms. Will set up pulmonary evaluation.

## 2012-03-30 NOTE — Assessment & Plan Note (Signed)
Blood pressure well-controlled on current medications. Will check renal function with labs today. Followup one month.

## 2012-03-30 NOTE — Assessment & Plan Note (Signed)
Patient reports blood sugars are well controlled with metformin alone. Will check A1c with labs today. Followup one month.

## 2012-03-30 NOTE — Assessment & Plan Note (Signed)
Patient is over due for screening mammogram. Will set up today.

## 2012-03-30 NOTE — Assessment & Plan Note (Signed)
Patient with some intermittent episodes of diaphoresis. No chest pain or dyspnea during these episodes. Will check electrolytes, thyroid function, blood counts with labs. Will also get records on previous evaluation of thyroid nodule.

## 2012-03-30 NOTE — Addendum Note (Signed)
Addended by: Crissie Reese on: 03/30/2012 03:06 PM   Modules accepted: Orders

## 2012-03-30 NOTE — Assessment & Plan Note (Signed)
Patient has history of thyroid nodule which was "monitored." Will get records on this. Check TSH with labs today.

## 2012-03-30 NOTE — Assessment & Plan Note (Signed)
Edema of the left lower extremity which appears to be secondary to lymphedema. Will set up vascular evaluation with ultrasound to look for chronic venous insufficiency. Will review previous records on evaluation for DVT. Question if she might benefit from lymphedema compression therapy. Followup one month.

## 2012-03-30 NOTE — Assessment & Plan Note (Signed)
Secondary to diastolic heart failure with pulmonary hypertension. Appears euvolemic today. Will check electrolytes and renal function with labs today. Followup one month.

## 2012-04-01 LAB — URINE CULTURE: Colony Count: 2000

## 2012-04-19 ENCOUNTER — Encounter: Payer: Self-pay | Admitting: Cardiovascular Disease

## 2012-04-19 ENCOUNTER — Ambulatory Visit (INDEPENDENT_AMBULATORY_CARE_PROVIDER_SITE_OTHER): Payer: Medicare Other | Admitting: Cardiovascular Disease

## 2012-04-19 VITALS — BP 170/78 | HR 74 | Ht 61.0 in | Wt 225.2 lb

## 2012-04-19 DIAGNOSIS — I509 Heart failure, unspecified: Secondary | ICD-10-CM

## 2012-04-19 DIAGNOSIS — I209 Angina pectoris, unspecified: Secondary | ICD-10-CM

## 2012-04-19 DIAGNOSIS — I208 Other forms of angina pectoris: Secondary | ICD-10-CM

## 2012-04-19 DIAGNOSIS — I10 Essential (primary) hypertension: Secondary | ICD-10-CM

## 2012-04-19 DIAGNOSIS — R0602 Shortness of breath: Secondary | ICD-10-CM

## 2012-04-19 NOTE — Assessment & Plan Note (Signed)
Due to severe diastolic heart failure with pulmonary hypertension and refractory fluid overload.  Currently, she reports significant improvement in her symptoms. She appears to be euvolemic. Continue current dose of Lasix and spironolactone. Recent labs were unremarkable.

## 2012-04-19 NOTE — Assessment & Plan Note (Signed)
Her blood pressure is elevated today but she did not take her medications yet. I suspect that her blood pressure control is better than before with the addition of amlodipine and Imdur.

## 2012-04-19 NOTE — Patient Instructions (Addendum)
Continue same medications.  Follow up in 3 months.  

## 2012-04-19 NOTE — Assessment & Plan Note (Signed)
No evidence of obstructive coronary artery disease. I suspect that this was likely due to severely elevated filling pressures. No more chest pain at this time.

## 2012-04-19 NOTE — Progress Notes (Signed)
HPI  This is a 66 year old female who was here today for a followup visit regarding chronic diastolic heart failure with pulmonary hypertension. She has chronic left lower extremity swelling which has been evaluated in the past with venous duplex ultrasound on multiple occasions and was negative for DVT. Her echocardiogram  showed normal LV systolic function with mild diastolic dysfunction and evidence of elevated filling pressures. There was also moderate to severe pulmonary hypertension with peak systolic pressure of 56 mm Hg. she underwent cardiac catheterization which showed no evidence of obstructive coronary artery disease. She was noted to be severely hypertensive with significant elevation in left ventricular end-diastolic pressure. She was noted to be hypokalemic with a potassium of 3 on her precath labs. Spironolactone 25 mg once daily was added. Lasix 20 mg twice daily was also added.  During her visit last month, her blood pressure was significantly elevated. I added amlodipine 5 mg daily and Imdur 60 mg once daily. She was also noted to be hypoxic after walking less than 50 feet. Her O2 sat went down to 89-90%. Thus, I ordered home oxygen.  Overall, the patient reports significant improvement in her symptoms. She is able to do more activities with less dyspnea. She denies any chest pain at this time.     Allergies  Allergen Reactions  . Penicillins      Current Outpatient Prescriptions on File Prior to Visit  Medication Sig Dispense Refill  . Aclidinium Bromide (TUDORZA PRESSAIR) 400 MCG/ACT AEPB Inhale into the lungs as needed.      [name] albuterol (PROVENTIL HFA;VENTOLIN HFA) 108 (90 BASE) MCG/ACT inhaler Inhale 2 puffs into the lungs as needed.      [name] amLODipine (NORVASC) 5 MG tablet Take 1 tablet (5 mg total) by mouth daily.  30 tablet  6  . aspirin 81 MG tablet Take 81 mg by mouth daily.      . carvedilol (COREG) 6.25 MG tablet Take 1 tablet (6.25 mg total) by mouth 2 (two)  times daily.  60 tablet  6  . enalapril (VASOTEC) 20 MG tablet Take 1 tablet (20 mg total) by mouth 2 (two) times daily.  60 tablet  6  . furosemide (LASIX) 20 MG tablet Take 1 tablet (20 mg total) by mouth 2 (two) times daily.  60 tablet  6  . isosorbide mononitrate (IMDUR) 60 MG 24 hr tablet Take 1 tablet (60 mg total) by mouth daily.  30 tablet  6  . magnesium oxide (MAG-OX 400) 400 MG tablet Take 1 tablet (400 mg total) by mouth 2 (two) times daily.  60 tablet  3  . meloxicam (MOBIC) 7.5 MG tablet Take 7.5 mg by mouth as needed.       . metFORMIN (GLUMETZA) 500 MG (MOD) 24 hr tablet Takes 2 tablets twice daily.      . NON FORMULARY Oxygen 2 liters.      . NON FORMULARY CPAP      . simvastatin (ZOCOR) 20 MG tablet Take 20 mg by mouth daily.      [name] spironolactone (ALDACTONE) 25 MG tablet Take 1 tablet (25 mg total) by mouth daily.  30 tablet  6     Past Medical History  Diagnosis Date  . Thyroid disorder   . Diabetes mellitus     type II  . Thyroid nodule   . Sleep apnea   . Obesity   . Hypertension   . Hyperlipidemia   . Urinary incontinence   .  CHF (congestive heart failure) 2005    Dx at [facility]  . Chronic back pain   . COPD (chronic obstructive pulmonary disease)     2L [place]  . Sleep apnea      Past Surgical History  Procedure Date  . Cholecystectomy   . Vaginal hysterectomy   . Cardiac catheterization 2013    @ [place]: No obstructive CAD: Only 20% ostial left circumflex.  . Abdominal hysterectomy 1969     Family History  Problem Relation Age of Onset  . Heart disease Mother   . Hypertension Mother   . COPD Mother   . Thyroid disease Mother   . Hypertension Father   . Hypertension Sister   . Thyroid disease Sister   . Hypertension Sister   . Thyroid disease Sister      History   Social History  . Marital Status: Divorced    Spouse Name: N/A    Number of Children: N/A  . Years of Education: N/A   Occupational History  . Not on file.   Social History  Main Topics  . Smoking status: Former Research scientist (life sciences)  . Smokeless tobacco: Not on file  . Alcohol Use: No  . Drug Use: No  . Sexually Active:    Other Topics Concern  . Not on file   Social History Narrative   Lives in [place] with daughter. Has 4 children. No pets.Work - Restaurant manager, fast food, retiredDiet - regular       PHYSICAL EXAM   BP 170/78  Pulse 74  Ht 5\' 1"  (1.549 m)  Wt 225 lb 4 oz (102.173 kg)  BMI 42.56 kg/m2  SpO2 96% Constitutional: She is oriented to person, place, and time. She appears well-developed and well-nourished. No distress.  HENT: No nasal discharge.  Head: Normocephalic and atraumatic.  Eyes: Pupils are equal and round. Right eye exhibits no discharge. Left eye exhibits no discharge.  Neck: Normal range of motion. Neck supple. No JVD present. No thyromegaly present.  Cardiovascular: Normal rate, regular rhythm, normal heart sounds. Exam reveals no gallop and no friction rub. No murmur heard.  Pulmonary/Chest: Effort normal and breath sounds normal. No stridor. No respiratory distress. She has no wheezes. She has no rales. She exhibits no tenderness.  Abdominal: Soft. Bowel sounds are normal. She exhibits no distension. There is no tenderness. There is no rebound and no guarding.  Musculoskeletal: Normal range of motion. She exhibits chronic left lower extremity edema and no tenderness.  Neurological: She is alert and oriented to person, place, and time. Coordination normal.  Skin: Skin is warm and dry. No rash noted. She is not diaphoretic. No erythema. No pallor.  Psychiatric: She has a normal mood and affect. Her behavior is normal. Judgment and thought content normal.     EKG: Normal sinus rhythm with nonspecific T wave changes.  ASSESSMENT AND PLAN

## 2012-04-22 ENCOUNTER — Ambulatory Visit (INDEPENDENT_AMBULATORY_CARE_PROVIDER_SITE_OTHER): Payer: Medicare Other | Admitting: Pulmonary Disease

## 2012-04-22 ENCOUNTER — Encounter: Payer: Self-pay | Admitting: Pulmonary Disease

## 2012-04-22 VITALS — BP 154/94 | HR 60 | Temp 98.6°F | Ht 61.0 in | Wt 222.0 lb

## 2012-04-22 DIAGNOSIS — I509 Heart failure, unspecified: Secondary | ICD-10-CM

## 2012-04-22 DIAGNOSIS — R0989 Other specified symptoms and signs involving the circulatory and respiratory systems: Secondary | ICD-10-CM

## 2012-04-22 DIAGNOSIS — R0602 Shortness of breath: Secondary | ICD-10-CM

## 2012-04-22 DIAGNOSIS — I2789 Other specified pulmonary heart diseases: Secondary | ICD-10-CM

## 2012-04-22 DIAGNOSIS — G4733 Obstructive sleep apnea (adult) (pediatric): Secondary | ICD-10-CM | POA: Insufficient documentation

## 2012-04-22 DIAGNOSIS — J9610 Chronic respiratory failure, unspecified whether with hypoxia or hypercapnia: Secondary | ICD-10-CM

## 2012-04-22 DIAGNOSIS — I272 Pulmonary hypertension, unspecified: Secondary | ICD-10-CM

## 2012-04-22 DIAGNOSIS — R0609 Other forms of dyspnea: Secondary | ICD-10-CM

## 2012-04-22 DIAGNOSIS — R0600 Dyspnea, unspecified: Secondary | ICD-10-CM

## 2012-04-22 NOTE — Progress Notes (Signed)
Subjective:    Patient ID: Debra Saunders, female    DOB: [birth year], 66 y.o.   MRN: [id number]  HPI  Debra Saunders is a very pleasant 65 year old female who comes to our clinic today for evaluation of pulmonary hypertension. She states that she was diagnosed with this in 2005 when she experienced increasing shortness of breath. During that time she had left leg swelling greater than the right and a lower charity Doppler ultrasound by report showed evidence of a prior lower extremity DVT but no current DVT. She was referred to Dr. Thom Chimes at [facility] and he said she had pulmonary hypertension because of CHF and obstructive sleep apnea. During that time she was started on CPAP therapy for obstructive sleep apnea which she continues to use.  She has not seen Dr. Gaynell Face in a number of years because she lost her insurance. She has continued to use 2 L of oxygen continuously since 2005 and CPAP. In the last several years she has gained at least 50 pounds and her exercise tolerance and shortness of breath has worsened. She now gets short of breath with just taking "a few steps" and cannot carry in groceries in her house. She often gets lightheaded with exertion. She was given a presumptive diagnosis of COPD based on the fact that she was dyspneic and on oxygen. She was given inhalers for this but it did not help. Of note her smoking history is very minimal and she says she only smoked about 10 cigarettes during the course of 3 months when she was a young adult.  Earlier this year she saw Dr. Fletcher Anon and underwent a left heart catheterization which showed normal coronary arteries and very high filling pressures of the left ventricle. There is also high systemic pressures. Since that time her antihypertensive therapies have been adjusted.  She is coming to our clinic for further evaluation and management of pulmonary hypertension.   In terms of her obstructive sleep apnea symptoms she  says that she does not feel well rested in the morning and she often falls asleep easily in the afternoon.    Past Medical History  Diagnosis Date  . Thyroid disorder   . Diabetes mellitus     type II  . Thyroid nodule   . Sleep apnea   . Obesity   . Hypertension   . Hyperlipidemia   . Urinary incontinence   . CHF (congestive heart failure) 2005    Dx at [facility]  . Chronic back pain   . COPD (chronic obstructive pulmonary disease)     2L [place]  . Sleep apnea      Family History  Problem Relation Age of Onset  . Heart disease Mother   . Hypertension Mother   . COPD Mother     was a smoker  . Thyroid disease Mother   . Hypertension Father   . Hypertension Sister   . Thyroid disease Sister   . Hypertension Sister   . Thyroid disease Sister   . Breast cancer Maternal Aunt      History   Social History  . Marital Status: Divorced    Spouse Name: N/A    Number of Children: N/A  . Years of Education: N/A   Occupational History  . Retired- factory work     exposed to [employer]  . Smoking status: Former Smoker -- 0.3 packs/day for 2 years    Types: Cigarettes  Quit date: 06/23/1990  . Smokeless tobacco: Not on file  . Alcohol Use: No  . Drug Use: No  . Sexually Active: Not on file   Other Topics Concern  . Not on file   Social History Narrative   Lives in [place] with daughter. Has 4 children. No pets.Work - Restaurant manager, fast food, retiredDiet - regular     Allergies  Allergen Reactions  . Penicillins      Outpatient Prescriptions Prior to Visit  Medication Sig Dispense Refill  . Aclidinium Bromide (TUDORZA PRESSAIR) 400 MCG/ACT AEPB Inhale into the lungs as needed.      [name] albuterol (PROVENTIL HFA;VENTOLIN HFA) 108 (90 BASE) MCG/ACT inhaler Inhale 2 puffs into the lungs as needed.      [name] amLODipine (NORVASC) 5 MG tablet Take 1 tablet (5 mg total) by mouth daily.  30 tablet  6  . aspirin 81 MG tablet Take 81 mg by mouth daily.        . carvedilol (COREG) 6.25 MG tablet Take 1 tablet (6.25 mg total) by mouth 2 (two) times daily.  60 tablet  6  . enalapril (VASOTEC) 20 MG tablet Take 1 tablet (20 mg total) by mouth 2 (two) times daily.  60 tablet  6  . furosemide (LASIX) 20 MG tablet Take 1 tablet (20 mg total) by mouth 2 (two) times daily.  60 tablet  6  . isosorbide mononitrate (IMDUR) 60 MG 24 hr tablet Take 1 tablet (60 mg total) by mouth daily.  30 tablet  6  . magnesium oxide (MAG-OX 400) 400 MG tablet Take 1 tablet (400 mg total) by mouth 2 (two) times daily.  60 tablet  3  . meloxicam (MOBIC) 7.5 MG tablet Take 7.5 mg by mouth as needed.       . metFORMIN (GLUMETZA) 500 MG (MOD) 24 hr tablet Takes 2 tablets twice daily.      . NON FORMULARY Oxygen 2 liters.      . NON FORMULARY CPAP      . simvastatin (ZOCOR) 20 MG tablet Take 20 mg by mouth daily.      [name] spironolactone (ALDACTONE) 25 MG tablet Take 1 tablet (25 mg total) by mouth daily.  30 tablet  6      Review of Systems  Constitutional: Negative for fever, chills, diaphoresis, appetite change and fatigue.  HENT: Negative for hearing loss, nosebleeds, congestion, sore throat, rhinorrhea, trouble swallowing, neck stiffness, postnasal drip and sinus pressure.   Eyes: Negative for discharge, redness and visual disturbance.  Respiratory: Positive for chest tightness and shortness of breath. Negative for cough, choking and wheezing.   Cardiovascular: Positive for leg swelling. Negative for chest pain.  Gastrointestinal: Negative for nausea, abdominal pain, diarrhea, constipation and blood in stool.  Genitourinary: Negative for dysuria, frequency and hematuria.  Musculoskeletal: Negative for myalgias, joint swelling and arthralgias.  Skin: Negative for color change, pallor and rash.  Neurological: Negative for dizziness, seizures, facial asymmetry, speech difficulty, light-headedness, numbness and headaches.  Hematological: Negative for adenopathy. Does not  bruise/bleed easily.       Objective:   Physical Exam  Filed Vitals:   04/22/12 1510  BP: 154/94  Pulse: 60  Temp: 98.6 F (37 C)  TempSrc: Oral  Height: 5\' 1"  (1.549 m)  Weight: 222 lb (100.699 kg)  SpO2: 98%  2L [place]  Walked 950 feet during a 6MW and O2 saturation stayed > 92% on 2 L throughout  Gen: morbidly obese, no acute distress HEENT:  NCAT, PERRL, EOMi, OP clear, neck supple without masses PULM: Few insp crackles bases bilatearlly CV: RRR, distant heart sounds, no JVD AB: BS+, soft, trace tender, no hsm Ext: warm, no edema, no clubbing, no cyanosis Derm: no rash or skin breakdown Neuro: A&Ox4, CN II-XII intact, strength 5/5 in all 4 extremities      Assessment & Plan:   CHF (congestive heart failure) Debra Saunders's congestive heart failure (diastolic likely secondary to hypertension) is contributing to her pulmonary hypertension. At this point she appears euvolemic on physical exam but she still has elevated hypertension.  Plan: -Educated to focus on low salt diet and monitor fluid intake carefully -Continue afterload reduction and current medical therapy as written by Dr. Fletcher Anon -She does not have COPD so we can push the Coreg as high as necessary  Pulmonary hypertension Debra Saunders pulmonary hypertension is secondary to obstructive sleep apnea and CHF. Because it is secondary hypertension and not primary pulmonary arterial hypertension our care must focus on the comorbid illnesses that are contributing to her disease.  In that, she needs to have a new CPAP titration study as she has gained significant weight and is been a year since her last study. I'm not convinced that the pressure she is receiving now is adequate. She also needs to continue with the medical management of her CHF as directed by Dr. Fletcher Anon.  We will also refer her to pulmonary rehabilitation to help with deconditioning which is likely contributing to her shortness of breath.  OSA (obstructive  sleep apnea) It has been 8 years since she's had her last sleep study. Since that time she has gained 50 pounds and she is having classic symptoms of obstructive sleep apnea. It is time for her to have a repeat CPAP titration study so we will refer her for this.   Updated Medication List Outpatient Encounter Prescriptions as of 04/22/2012  Medication Sig Dispense Refill  . Aclidinium Bromide (TUDORZA PRESSAIR) 400 MCG/ACT AEPB Inhale into the lungs as needed.      [name] albuterol (PROVENTIL HFA;VENTOLIN HFA) 108 (90 BASE) MCG/ACT inhaler Inhale 2 puffs into the lungs as needed.      [name] amLODipine (NORVASC) 5 MG tablet Take 1 tablet (5 mg total) by mouth daily.  30 tablet  6  . aspirin 81 MG tablet Take 81 mg by mouth daily.      . carvedilol (COREG) 6.25 MG tablet Take 1 tablet (6.25 mg total) by mouth 2 (two) times daily.  60 tablet  6  . enalapril (VASOTEC) 20 MG tablet Take 1 tablet (20 mg total) by mouth 2 (two) times daily.  60 tablet  6  . furosemide (LASIX) 20 MG tablet Take 1 tablet (20 mg total) by mouth 2 (two) times daily.  60 tablet  6  . isosorbide mononitrate (IMDUR) 60 MG 24 hr tablet Take 1 tablet (60 mg total) by mouth daily.  30 tablet  6  . magnesium oxide (MAG-OX 400) 400 MG tablet Take 1 tablet (400 mg total) by mouth 2 (two) times daily.  60 tablet  3  . meloxicam (MOBIC) 7.5 MG tablet Take 7.5 mg by mouth as needed.       . metFORMIN (GLUMETZA) 500 MG (MOD) 24 hr tablet Takes 2 tablets twice daily.      . NON FORMULARY Oxygen 2 liters.      . NON FORMULARY CPAP      . simvastatin (ZOCOR) 20 MG tablet Take 20 mg by mouth  daily.      . spironolactone (ALDACTONE) 25 MG tablet Take 1 tablet (25 mg total) by mouth daily.  30 tablet  6

## 2012-04-22 NOTE — Assessment & Plan Note (Addendum)
Mrs. Debra Saunders pulmonary hypertension is secondary to obstructive sleep apnea and CHF. Because it is secondary hypertension and not primary pulmonary arterial hypertension our care must focus on the comorbid illnesses that are contributing to her disease.  In that, she needs to have a new CPAP titration study as she has gained significant weight and is been 8 years since her last study. I'm not convinced that the pressure she is receiving now is adequate. She also needs to continue with the medical management of her CHF as directed by Dr. Fletcher Anon.  We will also refer her to pulmonary rehabilitation to help with deconditioning which is likely contributing to her shortness of breath.

## 2012-04-22 NOTE — Assessment & Plan Note (Signed)
It has been 8 years since she's had her last sleep study. Since that time she has gained 50 pounds and she is having classic symptoms of obstructive sleep apnea. It is time for her to have a repeat CPAP titration study so we will refer her for this.

## 2012-04-22 NOTE — Assessment & Plan Note (Addendum)
Debra Saunders congestive heart failure (diastolic likely secondary to hypertension) is contributing to her pulmonary hypertension. At this point she appears euvolemic on physical exam but she still has elevated hypertension.  Plan: -Educated to focus on low salt diet and monitor fluid intake carefully -Continue afterload reduction and current medical therapy as written by Dr. Fletcher Anon -She does not have COPD so we can push the Coreg as high as necessary

## 2012-04-22 NOTE — Patient Instructions (Addendum)
We will obtain records from Dr. Karlene Einstein office at [facility] We will send you for a CPAP titration study in [facility] working out at [employer] at [employer] taking your medications as you are doing Watch your fluid and salt intake carefully.  Try to limit your sodium intake to no more than 2gm a day and your fluid intake to no more than 2 Liters daily. Continue using your medications as you are doing We will see you back in 2 months or sooner if needed

## 2012-04-26 ENCOUNTER — Other Ambulatory Visit: Payer: Medicare Other

## 2012-04-26 DIAGNOSIS — J9610 Chronic respiratory failure, unspecified whether with hypoxia or hypercapnia: Secondary | ICD-10-CM | POA: Insufficient documentation

## 2012-04-26 DIAGNOSIS — J9611 Chronic respiratory failure with hypoxia: Secondary | ICD-10-CM | POA: Insufficient documentation

## 2012-04-26 DIAGNOSIS — J9621 Acute and chronic respiratory failure with hypoxia: Secondary | ICD-10-CM | POA: Insufficient documentation

## 2012-04-26 LAB — FECAL OCCULT BLOOD, IMMUNOCHEMICAL: Fecal Occult Bld: NEGATIVE

## 2012-04-26 NOTE — Assessment & Plan Note (Signed)
She has a minimal smoking history, a history of normal spirometry, and has had no clinical improvement with various inhalers over the years.  Based on this I think there is little evidence of COPD and all of her dyspnea is due to obesity, CHF, pulm hypertension, and deconditioning.  Plan: -check spirometry at [facility] as part of enrollment into Pulm Rehab -as above

## 2012-04-28 ENCOUNTER — Ambulatory Visit (INDEPENDENT_AMBULATORY_CARE_PROVIDER_SITE_OTHER): Payer: Medicare Other | Admitting: Internal Medicine

## 2012-04-28 ENCOUNTER — Encounter: Payer: Self-pay | Admitting: Internal Medicine

## 2012-04-28 VITALS — BP 130/70 | HR 65 | Temp 98.3°F | Ht 61.0 in | Wt 224.0 lb

## 2012-04-28 DIAGNOSIS — Z23 Encounter for immunization: Secondary | ICD-10-CM

## 2012-04-28 DIAGNOSIS — E119 Type 2 diabetes mellitus without complications: Secondary | ICD-10-CM

## 2012-04-28 DIAGNOSIS — H6693 Otitis media, unspecified, bilateral: Secondary | ICD-10-CM

## 2012-04-28 DIAGNOSIS — H6690 Otitis media, unspecified, unspecified ear: Secondary | ICD-10-CM

## 2012-04-28 DIAGNOSIS — D649 Anemia, unspecified: Secondary | ICD-10-CM

## 2012-04-28 DIAGNOSIS — R600 Localized edema: Secondary | ICD-10-CM

## 2012-04-28 DIAGNOSIS — I10 Essential (primary) hypertension: Secondary | ICD-10-CM

## 2012-04-28 DIAGNOSIS — R609 Edema, unspecified: Secondary | ICD-10-CM

## 2012-04-28 MED ORDER — METFORMIN HCL ER (MOD) 500 MG PO TB24: 500.0000 mg | ORAL_TABLET | Freq: Two times a day (BID) | ORAL | Status: DC

## 2012-04-28 MED ORDER — AZITHROMYCIN 250 MG PO TABS: ORAL_TABLET | ORAL | Status: DC

## 2012-04-28 NOTE — Patient Instructions (Signed)
Debra Saunders - catalogue for compression stockings

## 2012-04-28 NOTE — Assessment & Plan Note (Signed)
Symptoms likely related to viral upper respiratory infection, however given patient's comorbidities and we are going into weekend, and physical exam findings of bilateral middle ear effusions with some erythema, will write prescription for azithromycin. If symptoms persist or worsen over the next 48 hours patient will start medication. She will use Tylenol as needed for pain or fever. She will call if symptoms are not improving.

## 2012-04-28 NOTE — Assessment & Plan Note (Signed)
Mild anemia noted on labs 03/2012.  Fecal occult blood negative. Colonoscopy pending once cardiology clearance complete. Will plan to repeat CBC with labs at next visit.

## 2012-04-28 NOTE — Progress Notes (Signed)
Subjective:    Patient ID: Debra Saunders, female    DOB: May 09, 1946, 66 y.o.   MRN: [id number]  HPI 66 year old female with history of pulmonary hypertension, diabetes, chronic left lower extremity edema presents for followup. In the interim since her last visit, she was seen by pulmonologist to recommended sleep study. This is scheduled for later this month. She continues to have dyspnea with minimal exertion. She also continues to have episodes of diaphoresis with minimal exertion. Cardiology evaluation is in process.  In regards to her diabetes, she reports that blood sugars have been running lower, typically near 100 or less fasting. She also notes some diarrhea with use of metformin. Recent A1c was 5.6%.  In regards to left lower extremity edema, she was seen by vascular surgeon and recommended that she start wearing compression hose. She has not yet been fitted for these.  She reports the last couple of days she has developed clear nasal congestion, occasional chills, and occasional ear pain. She denies change in shortness of breath, chest pain, fever. She has not taken any medication for this.  Outpatient Encounter Prescriptions as of 04/28/2012  Medication Sig Dispense Refill  . Aclidinium Bromide (TUDORZA PRESSAIR) 400 MCG/ACT AEPB Inhale into the lungs as needed.      [name] albuterol (PROVENTIL HFA;VENTOLIN HFA) 108 (90 BASE) MCG/ACT inhaler Inhale 2 puffs into the lungs as needed.      [name] amLODipine (NORVASC) 5 MG tablet Take 1 tablet (5 mg total) by mouth daily.  30 tablet  6  . aspirin 81 MG tablet Take 81 mg by mouth daily.      [name] azithromycin (ZITHROMAX Z-PAK) 250 MG tablet Take 2 pills day 1, then 1 pill daily days 2-5  6 each  0  . carvedilol (COREG) 6.25 MG tablet Take 1 tablet (6.25 mg total) by mouth 2 (two) times daily.  60 tablet  6  . enalapril (VASOTEC) 20 MG tablet Take 1 tablet (20 mg total) by mouth 2 (two) times daily.  60 tablet  6  . furosemide (LASIX) 20 MG tablet Take  1 tablet (20 mg total) by mouth 2 (two) times daily.  60 tablet  6  . isosorbide mononitrate (IMDUR) 60 MG 24 hr tablet Take 1 tablet (60 mg total) by mouth daily.  30 tablet  6  . magnesium oxide (MAG-OX 400) 400 MG tablet Take 1 tablet (400 mg total) by mouth 2 (two) times daily.  60 tablet  3  . meloxicam (MOBIC) 7.5 MG tablet Take 7.5 mg by mouth as needed.       . metFORMIN (GLUMETZA) 500 MG (MOD) 24 hr tablet Take 1 tablet (500 mg total) by mouth 2 (two) times daily with a meal.  60 tablet  3  . NON FORMULARY Oxygen 2 liters.      . NON FORMULARY CPAP      . simvastatin (ZOCOR) 20 MG tablet Take 20 mg by mouth daily.      [name] spironolactone (ALDACTONE) 25 MG tablet Take 1 tablet (25 mg total) by mouth daily.  30 tablet  6  . [DISCONTINUED] metFORMIN (GLUMETZA) 500 MG (MOD) 24 hr tablet Takes 2 tablets twice daily.      . [DISCONTINUED] metFORMIN (GLUMETZA) 500 MG (MOD) 24 hr tablet Take 1 tablet (500 mg total) by mouth 2 (two) times daily with a meal. Takes 2 tablets twice daily.  60 tablet  3    Review of Systems  Constitutional: Positive for chills, diaphoresis  and fatigue. Negative for fever, appetite change and unexpected weight change.  HENT: Positive for ear pain, congestion and rhinorrhea. Negative for sore throat, trouble swallowing, neck pain, voice change and sinus pressure.   Eyes: Negative for visual disturbance.  Respiratory: Positive for shortness of breath and wheezing. Negative for cough and stridor.   Cardiovascular: Positive for leg swelling. Negative for chest pain and palpitations.  Gastrointestinal: Negative for nausea, vomiting, abdominal pain, diarrhea, constipation, blood in stool, abdominal distention and anal bleeding.  Genitourinary: Negative for dysuria and flank pain.  Musculoskeletal: Negative for myalgias, arthralgias and gait problem.  Skin: Negative for color change and rash.  Neurological: Negative for dizziness and headaches.  Hematological: Negative for  adenopathy. Does not bruise/bleed easily.  Psychiatric/Behavioral: Negative for suicidal ideas, sleep disturbance and dysphoric mood. The patient is not nervous/anxious.        Objective:   Physical Exam  Constitutional: She is oriented to person, place, and time. She appears well-developed and well-nourished. No distress.  HENT:  Head: Normocephalic and atraumatic.  Right Ear: External ear normal. Tympanic membrane is bulging. A middle ear effusion is present.  Left Ear: External ear normal. Tympanic membrane is bulging. A middle ear effusion is present.  Nose: Nose normal.  Mouth/Throat: Oropharynx is clear and moist. No oropharyngeal exudate.  Eyes: Conjunctivae normal are normal. Pupils are equal, round, and reactive to light. Right eye exhibits no discharge. Left eye exhibits no discharge. No scleral icterus.  Neck: Normal range of motion. Neck supple. No tracheal deviation present. No thyromegaly present.  Cardiovascular: Normal rate, regular rhythm, normal heart sounds and intact distal pulses.  Exam reveals no gallop and no friction rub.   No murmur heard. Pulmonary/Chest: Effort normal and breath sounds normal. No accessory muscle usage. Not tachypneic. No respiratory distress. She has no decreased breath sounds. She has no wheezes. She has no rhonchi. She has no rales. She exhibits no tenderness.  Musculoskeletal: Normal range of motion. She exhibits edema (left lower extremity non-pitting). She exhibits no tenderness.  Lymphadenopathy:    She has no cervical adenopathy.  Neurological: She is alert and oriented to person, place, and time. No cranial nerve deficit. She exhibits normal muscle tone. Coordination normal.  Skin: Skin is warm and dry. No rash noted. She is not diaphoretic. No erythema. No pallor.  Psychiatric: She has a normal mood and affect. Her behavior is normal. Judgment and thought content normal.          Assessment & Plan:

## 2012-04-28 NOTE — Assessment & Plan Note (Signed)
Blood pressure well-controlled today. We'll continue current medications. Followup one month.

## 2012-04-28 NOTE — Assessment & Plan Note (Signed)
Blood sugars have been running low and patient reports diarrhea with metformin. Last A1c was 5.6%. Will taper her metformin to 500 mg twice daily. If symptoms are persisting, will plan to stop medication. Followup in one month.

## 2012-04-28 NOTE — Assessment & Plan Note (Signed)
Patient was evaluated by vascular surgery and it was recommended that she use compression stockings. She has not yet purchased these. We discussed several options for buying compression stockings and the importance of wearing these.

## 2012-04-29 ENCOUNTER — Encounter: Payer: Self-pay | Admitting: Internal Medicine

## 2012-05-12 ENCOUNTER — Ambulatory Visit (HOSPITAL_BASED_OUTPATIENT_CLINIC_OR_DEPARTMENT_OTHER): Payer: Medicare Other | Attending: Pulmonary Disease | Admitting: Radiology

## 2012-05-12 VITALS — Ht 61.0 in | Wt 222.0 lb

## 2012-05-12 DIAGNOSIS — G4733 Obstructive sleep apnea (adult) (pediatric): Secondary | ICD-10-CM

## 2012-05-12 DIAGNOSIS — R0600 Dyspnea, unspecified: Secondary | ICD-10-CM

## 2012-05-12 DIAGNOSIS — R0989 Other specified symptoms and signs involving the circulatory and respiratory systems: Secondary | ICD-10-CM | POA: Insufficient documentation

## 2012-05-12 DIAGNOSIS — R0609 Other forms of dyspnea: Secondary | ICD-10-CM | POA: Insufficient documentation

## 2012-05-18 ENCOUNTER — Encounter: Payer: Self-pay | Admitting: Pulmonary Disease

## 2012-05-18 DIAGNOSIS — G4730 Sleep apnea, unspecified: Secondary | ICD-10-CM

## 2012-05-18 DIAGNOSIS — G4710 Hypersomnia, unspecified: Secondary | ICD-10-CM

## 2012-05-18 LAB — PULMONARY FUNCTION TEST

## 2012-05-18 NOTE — Procedures (Cosign Needed)
Debra Saunders, Debra Saunders NO.:  [id number]  MEDICAL RECORD NO.:  [id number]          PATIENT TYPE:  OUT  LOCATION:  SLEEP CENTER                 FACILITY:  [facility]  PHYSICIAN:  Kathee Delton, MD,FCCPDATE OF BIRTH:  [birth year] 03, 1946  DATE OF STUDY:  05/12/2012                           NOCTURNAL POLYSOMNOGRAM  REFERRING PHYSICIAN:  DOUGLAS Roselie Awkward, MD  LOCATION:  Sleep Lab.  REFERRING PHYSICIAN:  Norlene Campbell, MD  INDICATION FOR STUDY:  Hypersomnia with sleep apnea.  EPWORTH SLEEPINESS SCORE:  4.  SLEEP ARCHITECTURE:  The patient had a total sleep time of 271 minutes with no slow-wave sleep and only 32 minutes of REM.  Sleep onset latency was mildly prolonged at 35 minutes and REM onset was prolonged at 207 minutes.  Sleep efficiency was poor at 57%.  RESPIRATORY DATA:  The patient underwent a CPAP titration study given her history of obstructive sleep apnea and need for pressure reoptimization.  She was fitted with a ResMed small Quattro FX full face mask and CPAP titration was initiated at 5 cm of water.  Her pressure was increased in order to treat both obstructive events and snoring, and she was found to have an optimal CPAP pressure of 13 cm of water. However, she had very little REM noted on this final pressure.  OXYGEN DATA:  O2 desaturation noted as low as 86% with the patient's obstructive events.  This resolved with optimal CPAP.  CARDIAC DATA:  The patient was noted to have sinus bradycardia at times as well as frequent PVCs.  She also had a few 3-5 beat runs of ventricular tachycardia.  These did not necessarily occur with her obstructive events.  MOVEMENT/PARASOMNIA:  The patient had no significant leg jerks or other abnormal behaviors noted.  IMPRESSION/RECOMMENDATION: 1. Good control of previously documented obstructive sleep apnea with     a CPAP pressure of 13 cm of water, delivered by a small ResMed     Quattro FX full-face  mask.  This appeared to be an appropriate     pressure for her, however, she did not have significant quantity of     REM on the final pressure.  She should also be encouraged to work     aggressively on weight loss. 2. Sinus bradycardia noted during the study. 3. Frequent premature ventricular contractions noted as well as a few     3-5 beat runs of ventricular tachycardia that were self-limiting.     These episodes were not correlated with apneic events.     Kathee Delton, MD,FCCP [place], [place] Board of Sleep Medicine    [facility]/MEDQ  D:  05/18/2012 08:24:30  T:  05/18/2012 10:04:25  Job:  [id number]

## 2012-05-23 ENCOUNTER — Encounter: Payer: Self-pay | Admitting: Pulmonary Disease

## 2012-05-24 ENCOUNTER — Telehealth: Payer: Self-pay | Admitting: *Deleted

## 2012-05-24 DIAGNOSIS — G4733 Obstructive sleep apnea (adult) (pediatric): Secondary | ICD-10-CM

## 2012-05-24 NOTE — Telephone Encounter (Signed)
Called and spoke with the pt's daughter. She states that she is pretty sure that the pt's CPAP is set at 10 I have sent order to [facility] to have [facility] change setting to 13

## 2012-05-24 NOTE — Telephone Encounter (Signed)
Message copied by Rosana Berger on Mon May 24, 2012 10:42 AM ------      Message from: Simonne Maffucci B      Created: Mon May 24, 2012  8:26 AM       L,            Can you make sure her prescription for CPAP at home is 13cm H20?            Thanks,      B

## 2012-05-28 ENCOUNTER — Ambulatory Visit: Payer: Medicare Other | Admitting: Internal Medicine

## 2012-06-01 ENCOUNTER — Encounter: Payer: Self-pay | Admitting: Pulmonary Disease

## 2012-06-08 ENCOUNTER — Encounter: Payer: Self-pay | Admitting: Internal Medicine

## 2012-06-08 ENCOUNTER — Ambulatory Visit (INDEPENDENT_AMBULATORY_CARE_PROVIDER_SITE_OTHER): Payer: Medicare Other | Admitting: Internal Medicine

## 2012-06-08 VITALS — BP 138/82 | HR 67 | Temp 98.2°F | Resp 16 | Wt 222.8 lb

## 2012-06-08 DIAGNOSIS — R609 Edema, unspecified: Secondary | ICD-10-CM

## 2012-06-08 DIAGNOSIS — I10 Essential (primary) hypertension: Secondary | ICD-10-CM

## 2012-06-08 DIAGNOSIS — R600 Localized edema: Secondary | ICD-10-CM

## 2012-06-08 DIAGNOSIS — M940 Chondrocostal junction syndrome [Tietze]: Secondary | ICD-10-CM

## 2012-06-08 DIAGNOSIS — D649 Anemia, unspecified: Secondary | ICD-10-CM

## 2012-06-08 DIAGNOSIS — E119 Type 2 diabetes mellitus without complications: Secondary | ICD-10-CM

## 2012-06-08 LAB — CBC WITH DIFFERENTIAL/PLATELET
Basophils Absolute: 0 10*3/uL (ref 0.0–0.1)
Basophils Relative: 0.3 % (ref 0.0–3.0)
Eosinophils Absolute: 0.2 10*3/uL (ref 0.0–0.7)
Eosinophils Relative: 3.4 % (ref 0.0–5.0)
HCT: 36.6 % (ref 36.0–46.0)
Hemoglobin: 11.6 g/dL — ABNORMAL LOW (ref 12.0–15.0)
Lymphocytes Relative: 29.8 % (ref 12.0–46.0)
Lymphs Abs: 1.6 10*3/uL (ref 0.7–4.0)
MCHC: 31.8 g/dL (ref 30.0–36.0)
MCV: 83.6 fl (ref 78.0–100.0)
Monocytes Absolute: 0.3 10*3/uL (ref 0.1–1.0)
Monocytes Relative: 6.3 % (ref 3.0–12.0)
Neutro Abs: 3.3 10*3/uL (ref 1.4–7.7)
Neutrophils Relative %: 60.2 % (ref 43.0–77.0)
Platelets: 236 10*3/uL (ref 150.0–400.0)
RBC: 4.38 Mil/uL (ref 3.87–5.11)
RDW: 14.2 % (ref 11.5–14.6)
WBC: 5.5 10*3/uL (ref 4.5–10.5)

## 2012-06-08 LAB — COMPREHENSIVE METABOLIC PANEL
ALT: 25 U/L (ref 0–35)
AST: 27 U/L (ref 0–37)
Albumin: 3.5 g/dL (ref 3.5–5.2)
Alkaline Phosphatase: 91 U/L (ref 39–117)
BUN: 9 mg/dL (ref 6–23)
CO2: 28 mEq/L (ref 19–32)
Calcium: 8.9 mg/dL (ref 8.4–10.5)
Chloride: 100 mEq/L (ref 96–112)
Creatinine, Ser: 0.6 mg/dL (ref 0.4–1.2)
GFR: 130 mL/min (ref >60.00)
Glucose, Bld: 144 mg/dL — ABNORMAL HIGH (ref 70–99)
Potassium: 3.2 mEq/L — ABNORMAL LOW (ref 3.5–5.1)
Sodium: 137 mEq/L (ref 135–145)
Total Bilirubin: 0.8 mg/dL (ref 0.3–1.2)
Total Protein: 7.7 g/dL (ref 6.0–8.3)

## 2012-06-08 LAB — FERRITIN: Ferritin: 120.1 ng/mL (ref 10.0–291.0)

## 2012-06-08 LAB — VITAMIN B12: Vitamin B-12: 166 pg/mL — ABNORMAL LOW (ref 211–911)

## 2012-06-08 NOTE — Assessment & Plan Note (Signed)
Blood sugars well-controlled even with lower dose of metformin. Less diarrhea with current dose of metformin. We'll plan to continue. Will plan to check A1c with labs in 1 month. Followup one month.

## 2012-06-08 NOTE — Patient Instructions (Signed)
Costochondritis Costochondritis (Tietze syndrome), or costochondral separation, is a swelling and irritation (inflammation) of the tissue (cartilage) that connects your ribs with your breastbone (sternum). It may occur on its own (spontaneously), through damage caused by an accident (trauma), or simply from coughing or minor exercise. It may take up to 6 weeks to get better and longer if you are unable to be conservative in your activities. HOME CARE INSTRUCTIONS   Avoid exhausting physical activity. Try not to strain your ribs during normal activity. This would include any activities using chest, belly (abdominal), and side muscles, especially if heavy weights are used.  Use ice for 15 to 20 minutes per hour while awake for the first 2 days. Place the ice in a plastic bag, and place a towel between the bag of ice and your skin.  Only take over-the-counter or prescription medicines for pain, discomfort, or fever as directed by your caregiver. SEEK IMMEDIATE MEDICAL CARE IF:   Your pain increases or you are very uncomfortable.  You have a fever.  You develop difficulty with your breathing.  You cough up blood.  You develop worse chest pains, shortness of breath, sweating, or vomiting.  You develop new, unexplained problems (symptoms). MAKE SURE YOU:   Understand these instructions.  Will watch your condition.  Will get help right away if you are not doing well or get worse. Document Released: 03/19/2005 Document Revised: 09/01/2011 Document Reviewed: 01/26/2008 [facility] Patient Information 2013 [place].

## 2012-06-08 NOTE — Assessment & Plan Note (Signed)
Right-sided chest wall tenderness most consistent with costochondritis. Likely related to increase physical activity with exercise through pulmonary rehabilitation. Encouraged patient to resume use meloxicam 7.5-15 mg daily over the next few days. If no improvement, she will call.

## 2012-06-08 NOTE — Progress Notes (Signed)
Subjective:    Patient ID: Debra Saunders, female    DOB: [birth year], 66 y.o.   MRN: [id number]  HPI 66 year old female with history of hypertension, congestive heart failure, pulmonary hypertension, diabetes, lymphedema of the left leg presents for followup. At her last visit, she was having frequent episodes of diarrhea associated with use of metformin. She was instructed to reduce her dose of metformin to once daily. She reports significant improvement in diarrhea with this change. However, she notes her sugars have been slightly higher, typically 120-140 fasting. She only rarely has a sugar of 200, typically after eating a high sugar meal. She denies polyuria or polyphasia. She reports she is generally feeling well. She is particularly getting in a pulmonary rehabilitation program performing regular exercise. Her daughter notes her energy level has been improved.  Her only concern today is several day history of tenderness to palpation over her right anterior chest wall. Notably, she has been using the machine and pulmonary rehabilitation in which she repeatedly extensor arms. She has not been taking her meloxicam. This pain is present and worsened with movement. It is not changed with exertion. There is no associated diaphoresis, nausea, or palpitations.  Outpatient Encounter Prescriptions as of 06/08/2012  Medication Sig Dispense Refill  . Aclidinium Bromide (TUDORZA PRESSAIR) 400 MCG/ACT AEPB Inhale into the lungs as needed.      [name] albuterol (PROVENTIL HFA;VENTOLIN HFA) 108 (90 BASE) MCG/ACT inhaler Inhale 2 puffs into the lungs as needed.      [name] amLODipine (NORVASC) 5 MG tablet Take 1 tablet (5 mg total) by mouth daily.  30 tablet  6  . aspirin 81 MG tablet Take 81 mg by mouth daily.      [name] azithromycin (ZITHROMAX Z-PAK) 250 MG tablet Take 2 pills day 1, then 1 pill daily days 2-5  6 each  0  . carvedilol (COREG) 6.25 MG tablet Take 1 tablet (6.25 mg total) by mouth 2 (two) times daily.  60  tablet  6  . enalapril (VASOTEC) 20 MG tablet Take 1 tablet (20 mg total) by mouth 2 (two) times daily.  60 tablet  6  . furosemide (LASIX) 20 MG tablet Take 1 tablet (20 mg total) by mouth 2 (two) times daily.  60 tablet  6  . isosorbide mononitrate (IMDUR) 60 MG 24 hr tablet Take 1 tablet (60 mg total) by mouth daily.  30 tablet  6  . magnesium oxide (MAG-OX 400) 400 MG tablet Take 1 tablet (400 mg total) by mouth 2 (two) times daily.  60 tablet  3  . meloxicam (MOBIC) 7.5 MG tablet Take 7.5 mg by mouth as needed.       . metFORMIN (GLUMETZA) 500 MG (MOD) 24 hr tablet Take 1 tablet (500 mg total) by mouth 2 (two) times daily with a meal.  60 tablet  3  . NON FORMULARY Oxygen 2 liters.      . NON FORMULARY CPAP      . simvastatin (ZOCOR) 20 MG tablet Take 20 mg by mouth daily.      [name] spironolactone (ALDACTONE) 25 MG tablet Take 1 tablet (25 mg total) by mouth daily.  30 tablet  6    Review of Systems  Constitutional: Negative for fever, chills, appetite change, fatigue and unexpected weight change.  HENT: Negative for ear pain, congestion, sore throat, trouble swallowing, neck pain, voice change and sinus pressure.   Eyes: Negative for visual disturbance.  Respiratory: Negative for cough, shortness of breath,  wheezing and stridor.   Cardiovascular: Positive for chest pain and leg swelling. Negative for palpitations.  Gastrointestinal: Negative for nausea, vomiting, abdominal pain, diarrhea, constipation, blood in stool, abdominal distention and anal bleeding.  Genitourinary: Negative for dysuria and flank pain.  Musculoskeletal: Negative for myalgias, arthralgias and gait problem.  Skin: Negative for color change and rash.  Neurological: Negative for dizziness and headaches.  Hematological: Negative for adenopathy. Does not bruise/bleed easily.  Psychiatric/Behavioral: Negative for suicidal ideas, sleep disturbance and dysphoric mood. The patient is not nervous/anxious.          Objective:   Physical Exam  Constitutional: She is oriented to person, place, and time. She appears well-developed and well-nourished. No distress.  HENT:  Head: Normocephalic and atraumatic.  Right Ear: External ear normal.  Left Ear: External ear normal.  Nose: Nose normal.  Mouth/Throat: Oropharynx is clear and moist. No oropharyngeal exudate.  Eyes: Conjunctivae normal are normal. Pupils are equal, round, and reactive to light. Right eye exhibits no discharge. Left eye exhibits no discharge. No scleral icterus.  Neck: Normal range of motion. Neck supple. No tracheal deviation present. No thyromegaly present.  Cardiovascular: Normal rate, regular rhythm, normal heart sounds and intact distal pulses.  Exam reveals no gallop and no friction rub.   No murmur heard. Pulmonary/Chest: Effort normal and breath sounds normal. No respiratory distress. She has no wheezes. She has no rales. She exhibits tenderness and bony tenderness.    Musculoskeletal: Normal range of motion. She exhibits edema (eft lower extremity to knee, much improved compared to previous). She exhibits no tenderness.  Lymphadenopathy:    She has no cervical adenopathy.  Neurological: She is alert and oriented to person, place, and time. No cranial nerve deficit. She exhibits normal muscle tone. Coordination normal.  Skin: Skin is warm and dry. No rash noted. She is not diaphoretic. No erythema. No pallor.  Psychiatric: She has a normal mood and affect. Her behavior is normal. Judgment and thought content normal.          Assessment & Plan:

## 2012-06-08 NOTE — Assessment & Plan Note (Signed)
Improvement in lymphedema with use of compression pump. Will continue. Followup one month.

## 2012-06-08 NOTE — Assessment & Plan Note (Signed)
Blood counts show improvement today. We'll continue to monitor. Plan to repeat CBC in one to 3 months.

## 2012-06-11 ENCOUNTER — Ambulatory Visit (INDEPENDENT_AMBULATORY_CARE_PROVIDER_SITE_OTHER): Payer: Medicare Other | Admitting: Internal Medicine

## 2012-06-11 DIAGNOSIS — D649 Anemia, unspecified: Secondary | ICD-10-CM

## 2012-06-11 MED ORDER — CYANOCOBALAMIN 1000 MCG/ML IJ SOLN
1000.0000 ug | Freq: Once | INTRAMUSCULAR | Status: AC
  Administered 2012-06-11: 1000 ug via INTRAMUSCULAR

## 2012-06-11 NOTE — Progress Notes (Signed)
  Subjective:    Patient ID: Debra Saunders, female    DOB: 1946-05-19, 66 y.o.   MRN: [id number]  HPI Nurse visit only    Review of Systems     Objective:   Physical Exam        Assessment & Plan:

## 2012-06-15 LAB — HM MAMMOGRAPHY: HM Mammogram: NORMAL

## 2012-06-17 ENCOUNTER — Ambulatory Visit (INDEPENDENT_AMBULATORY_CARE_PROVIDER_SITE_OTHER): Payer: Medicare Other | Admitting: Internal Medicine

## 2012-06-17 DIAGNOSIS — D649 Anemia, unspecified: Secondary | ICD-10-CM

## 2012-06-17 MED ORDER — CYANOCOBALAMIN 1000 MCG/ML IJ SOLN
1000.0000 ug | Freq: Once | INTRAMUSCULAR | Status: AC
  Administered 2012-06-17: 1000 ug via INTRAMUSCULAR

## 2012-06-17 NOTE — Progress Notes (Signed)
  Subjective:    Patient ID: Debra Saunders, female    DOB: January 26, 1946, 66 y.o.   MRN: [id number]  HPI Nurse only    Review of Systems     Objective:   Physical Exam        Assessment & Plan:

## 2012-06-23 ENCOUNTER — Encounter: Payer: Self-pay | Admitting: Pulmonary Disease

## 2012-06-23 DIAGNOSIS — I639 Cerebral infarction, unspecified: Secondary | ICD-10-CM

## 2012-06-23 HISTORY — DX: Cerebral infarction, unspecified: I63.9

## 2012-06-23 HISTORY — PX: OTHER SURGICAL HISTORY: SHX169

## 2012-06-24 ENCOUNTER — Ambulatory Visit (INDEPENDENT_AMBULATORY_CARE_PROVIDER_SITE_OTHER): Payer: Medicare Other | Admitting: Internal Medicine

## 2012-06-24 DIAGNOSIS — D649 Anemia, unspecified: Secondary | ICD-10-CM

## 2012-06-24 MED ORDER — CYANOCOBALAMIN 1000 MCG/ML IJ SOLN
1000.0000 ug | Freq: Once | INTRAMUSCULAR | Status: AC
  Administered 2012-06-24: 1000 ug via INTRAMUSCULAR

## 2012-07-08 ENCOUNTER — Other Ambulatory Visit (INDEPENDENT_AMBULATORY_CARE_PROVIDER_SITE_OTHER): Payer: Medicare Other

## 2012-07-08 ENCOUNTER — Telehealth: Payer: Self-pay | Admitting: *Deleted

## 2012-07-08 DIAGNOSIS — Z139 Encounter for screening, unspecified: Secondary | ICD-10-CM

## 2012-07-08 LAB — MICROALBUMIN / CREATININE URINE RATIO
Creatinine,U: 80.2 mg/dL
Microalb Creat Ratio: 12.7 mg/g (ref 0.0–30.0)
Microalb, Ur: 10.2 mg/dL — ABNORMAL HIGH (ref 0.0–1.9)

## 2012-07-08 NOTE — Telephone Encounter (Signed)
This pt is coming for labs today, is it just for  Urine micro. ?

## 2012-07-08 NOTE — Telephone Encounter (Signed)
Will need to talk with pt. If having UTI symptoms, needs to be seen.

## 2012-07-08 NOTE — Telephone Encounter (Signed)
Patient came in today for a urine microalbumin to mail to [place] [facility].

## 2012-07-09 ENCOUNTER — Ambulatory Visit (INDEPENDENT_AMBULATORY_CARE_PROVIDER_SITE_OTHER): Payer: Medicare Other | Admitting: Adult Health

## 2012-07-09 ENCOUNTER — Encounter: Payer: Self-pay | Admitting: Adult Health

## 2012-07-09 ENCOUNTER — Telehealth: Payer: Self-pay | Admitting: Internal Medicine

## 2012-07-09 VITALS — BP 170/90 | HR 58 | Temp 98.3°F | Resp 18 | Ht 64.0 in | Wt 221.0 lb

## 2012-07-09 DIAGNOSIS — M940 Chondrocostal junction syndrome [Tietze]: Secondary | ICD-10-CM

## 2012-07-09 DIAGNOSIS — I10 Essential (primary) hypertension: Secondary | ICD-10-CM

## 2012-07-09 NOTE — Progress Notes (Signed)
Subjective:    Patient ID: Debra Saunders, female    DOB: 1945-08-27, 67 y.o.   MRN: [id number]  HPI  Pt is a pleasant 67 y/o female with a hx of HTN, HLD, DM, pulmonary htn, chronic diastolic HF, hx of angina who presents to clinic with c/o elevated B/P in pulmonary rehab today. When she arrive to rehab her B/P was 196/110 on the left arm and 190/100 on the right arm. During exercise, her B/P was 152/70. Last night patient reports having B/P 219/92 and the night before last her B/P 238/110. Pt reports that she has not taken amlodipine or imdur today. She is only taking lasix once daily. It is ordered bid.  Pt also c/o midsternal chest pain that occurs with movement. She was recently seen in clinic and diagnosed with costochondritis. Patient has been attending pulmonary rehab and using the machines which incorporates movement of her arms. Her pain is not associated with exertion.There is no associated diaphoresis, nausea, or palpitations.  Patient is normally short of breath with exertion and has oxygen via nasal cannula.  Patient reports that she is s/p complete cardiac workup including catheterization without evidence of blockages.     Current Outpatient Prescriptions on File Prior to Visit  Medication Sig Dispense Refill  . amLODipine (NORVASC) 5 MG tablet Take 1 tablet (5 mg total) by mouth daily.  30 tablet  6  . aspirin 81 MG tablet Take 81 mg by mouth daily.      . carvedilol (COREG) 6.25 MG tablet Take 1 tablet (6.25 mg total) by mouth 2 (two) times daily.  60 tablet  6  . enalapril (VASOTEC) 20 MG tablet Take 1 tablet (20 mg total) by mouth 2 (two) times daily.  60 tablet  6  . furosemide (LASIX) 20 MG tablet Take 1 tablet (20 mg total) by mouth 2 (two) times daily.  60 tablet  6  . isosorbide mononitrate (IMDUR) 60 MG 24 hr tablet Take 1 tablet (60 mg total) by mouth daily.  30 tablet  6  . magnesium oxide (MAG-OX 400) 400 MG tablet Take 1 tablet (400 mg total) by mouth 2 (two) times  daily.  60 tablet  3  . meloxicam (MOBIC) 7.5 MG tablet Take 7.5 mg by mouth as needed.       . metFORMIN (GLUMETZA) 500 MG (MOD) 24 hr tablet Take 1 tablet (500 mg total) by mouth 2 (two) times daily with a meal.  60 tablet  3  . NON FORMULARY Oxygen 2 liters.      . NON FORMULARY CPAP      . simvastatin (ZOCOR) 20 MG tablet Take 20 mg by mouth daily.      [name] spironolactone (ALDACTONE) 25 MG tablet Take 1 tablet (25 mg total) by mouth daily.  30 tablet  6  . Aclidinium Bromide (TUDORZA PRESSAIR) 400 MCG/ACT AEPB Inhale into the lungs as needed.      [name] albuterol (PROVENTIL HFA;VENTOLIN HFA) 108 (90 BASE) MCG/ACT inhaler Inhale 2 puffs into the lungs as needed.      [name] azithromycin (ZITHROMAX Z-PAK) 250 MG tablet Take 2 pills day 1, then 1 pill daily days 2-5  6 each  0     Review of Systems  Constitutional: Positive for fatigue. Negative for fever, chills and appetite change.  HENT: Negative.   Eyes: Negative.   Respiratory: Positive for shortness of breath. Negative for chest tightness.   Cardiovascular: Positive for chest pain and leg swelling.  Gastrointestinal: Negative.   Genitourinary: Negative.   Musculoskeletal: Negative.   Skin: Negative.   Neurological: Negative.   Psychiatric/Behavioral: Negative.     BP 170/90  Pulse 58  Temp 98.3 F (36.8 C)  Resp 18  Ht 5\' 4"  (1.626 m)  Wt 221 lb (100.245 kg)  BMI 37.93 kg/m2  SpO2 96%     Objective:   Physical Exam  Constitutional: She is oriented to person, place, and time. She appears well-developed and well-nourished. No distress.  HENT:  Head: Normocephalic and atraumatic.  Cardiovascular: Normal rate, regular rhythm, normal heart sounds and intact distal pulses.  Exam reveals no gallop.   No murmur heard. Pulmonary/Chest: Effort normal and breath sounds normal. No respiratory distress. She has no wheezes. She has no rales.  Abdominal: Soft. Bowel sounds are normal.  Musculoskeletal: She exhibits edema.  Neurological:  She is alert and oriented to person, place, and time.  Skin: Skin is warm and dry.  Psychiatric: She has a normal mood and affect. Her behavior is normal. Judgment and thought content normal.       Assessment & Plan:

## 2012-07-09 NOTE — Patient Instructions (Addendum)
  Please check your B/P over the weekend and keep a record of it. Take your b/p 1 hour after you have taken your medication.  Do this in the morning, midday and in the evening. Please call us with the readings on Monday.  Take Lasix twice daily. Take the first one when you first wake up and then take the other one no later than 1pm.

## 2012-07-09 NOTE — Telephone Encounter (Signed)
Received note that pt BP was elevated in pulmonary rehab today (however improved after exercise). Can we ask her to set up appointment either today or early next week to recheck BP? If today, will need to be with [name].

## 2012-07-11 ENCOUNTER — Encounter: Payer: Self-pay | Admitting: Adult Health

## 2012-07-11 NOTE — Assessment & Plan Note (Addendum)
Rechecked b/p manually 170/90. She has not taken her amlodipine. She is only taking one lasix pill daily. Discussed keeping b/p log over the weekend as follows: she will take her morning B/P meds and check her B/P 1 hour later after sitting for 5 minutes. She will do the same after taking her midday blood pressure meds and repeat in the evening. Also, patient will begin to take the second lasix pill between 12 pm and 1 pm. Patient will call with readings on Monday. Note that greater than 30 minutes were spent in face to face communication with patient.

## 2012-07-11 NOTE — Assessment & Plan Note (Signed)
Apply ice/heat to the area for 20 minutes at a time, 3 - 4 times daily. Continue the meloxicam. Do not lift greater than 5 lbs until area heals.

## 2012-07-12 ENCOUNTER — Telehealth: Payer: Self-pay | Admitting: *Deleted

## 2012-07-12 ENCOUNTER — Encounter: Payer: Self-pay | Admitting: Adult Health

## 2012-07-12 NOTE — Telephone Encounter (Signed)
Left message on voice mail  to call back

## 2012-07-12 NOTE — Telephone Encounter (Signed)
Patient will be sending BP results per My Chart to [name]

## 2012-07-12 NOTE — Telephone Encounter (Signed)
Patient saw Debra Saunders on 07/09/12.

## 2012-07-13 ENCOUNTER — Encounter: Payer: Self-pay | Admitting: Pulmonary Disease

## 2012-07-13 ENCOUNTER — Ambulatory Visit (INDEPENDENT_AMBULATORY_CARE_PROVIDER_SITE_OTHER): Payer: Medicare Other | Admitting: Pulmonary Disease

## 2012-07-13 VITALS — BP 140/86 | HR 58 | Temp 98.2°F | Wt 217.0 lb

## 2012-07-13 DIAGNOSIS — R0602 Shortness of breath: Secondary | ICD-10-CM

## 2012-07-13 DIAGNOSIS — G4733 Obstructive sleep apnea (adult) (pediatric): Secondary | ICD-10-CM

## 2012-07-13 DIAGNOSIS — I2789 Other specified pulmonary heart diseases: Secondary | ICD-10-CM

## 2012-07-13 DIAGNOSIS — I509 Heart failure, unspecified: Secondary | ICD-10-CM

## 2012-07-13 DIAGNOSIS — I272 Pulmonary hypertension, unspecified: Secondary | ICD-10-CM

## 2012-07-13 NOTE — Patient Instructions (Signed)
Keep using the CPAP at 13cm H20 as you are doing Use the albuterol 2 puffs ten minutes before you exercise and as needed for shortness of breath If you find that you are using the albuterol more, start using the QVar one puff twice a day no matter how you feel Keep going to pulmonary rehab We will see you back in 4-6 months or sooner if needed

## 2012-07-13 NOTE — Assessment & Plan Note (Signed)
There was a descrepancy between her 6MW distance at pulm rehab and in our office, but subjectively she seems to be doing much better.  She is to have a repeat 6MW tomorrow so hopefully we'll see improvement in her distance walked. Otherwise there is no indication for change in therapy today.

## 2012-07-13 NOTE — Progress Notes (Signed)
Subjective:    Patient ID: Debra Saunders, female    DOB: 1946/06/07, 67 y.o.   MRN: [id number]  Synopsis: Debra Saunders is a very pleasant female with obstructive sleep apnea, obesity, diastolic dysfunction, and pulmonary hypertension who is referred to the [facility] pulmonary clinic in November 2013 for evaluation of pulmonary hypertension. In 2005 she established care with Dr. Thom Chimes at [facility] for evaluation of the same. Dr. Gaynell Face felt that her pulmonary hypertension was do to her congestive heart failure, obesity, and obstructive sleep apnea. Unfortunately she lost her insurance around 2008 and did not have regular pulmonary visits. During the interval from 2008 2013 her shortness of breath increased and she gained about 50 pounds. She had a left heart catheterization in 2013 which showed very high filling pressures of the left ventricle as well as high systemic pressures but normal coronary arteries. In November 2013 her simple spirometry was completely normal.  HPI  07/13/2012 ROV>> Debra Saunders says she thinks she is doing maybe a little better since her last visit. She has increased energy compared to before, particularly on the days when she has gone to pulmonary rehabilitation. She says that this is really making a big difference. She is using her CPAP machine on 13 cm of water after her CPAP titration study. She states that she still has intermittent shortness of breath when going out into cold weather. Also if she goes from the cold into the warm weather this will sometimes make it feel like she can't get enough air in. She also feels that when she is exposed to cigarette smoke it will make her more short of breath. In the past, she has used multiple inhalers and never received any benefit from them.  Past Medical History  Diagnosis Date  . Thyroid disorder   . Diabetes mellitus     type II  . Thyroid nodule   . Sleep apnea   . Obesity   .  Hypertension   . Hyperlipidemia   . Urinary incontinence   . CHF (congestive heart failure) 2005    Dx at [facility]  . Chronic back pain   . COPD (chronic obstructive pulmonary disease)     2L [place]  . Sleep apnea      Review of Systems  Constitutional: Negative for fever, chills, activity change and fatigue.  HENT: Negative for congestion, rhinorrhea and postnasal drip.   Respiratory: Positive for chest tightness and shortness of breath. Negative for cough and wheezing.   Cardiovascular: Positive for leg swelling. Negative for chest pain and palpitations.       Objective:   Physical Exam Filed Vitals:   07/13/12 1343  BP: 140/86  Pulse: 58  Temp: 98.2 F (36.8 C)  TempSrc: Oral  Weight: 217 lb (98.431 kg)  SpO2: 97%  2L [place]  Gen: chronically ill appearing but in no acute distress HEENT: NCAT, PERRL, EOMi,  PULM: CTA B CV: RRR, no mgr, no JVD AB: BS+, soft, nontender, no hsm Ext: warm, trace edema in legs, no clubbing, no cyanosis      Assessment & Plan:   CHF (congestive heart failure) There is no clear volume overload on exam today.  She appears to be doing well with her Na and fluid monitoring.  Plan: -continue strict fluid and Na restriction  -otherwise per Dr. Tyrell Antonio recommendations  OSA (obstructive sleep apnea) She is to continue using CPAP at 13cm of H20 at home as she is doing.  This  seems to be working well for her.  Pulmonary hypertension There was a descrepancy between her 6MW distance at pulm rehab and in our office, but subjectively she seems to be doing much better.  She is to have a repeat 6MW tomorrow so hopefully we'll see improvement in her distance walked. Otherwise there is no indication for change in therapy today.  Shortness of breath Due to her diastolic dysfunction, obesity, and deconditioning.  She does not have COPD.  However based on her intermittent shortness of breath I question asthma.  She has not had clear benefit from inhalers in the  past.  She does not want to undergo more testing right now as she has been through a lot and feels that she is doing better.    I asked her to use albuterol as needed for dyspnea and ten minutes before exercise. If she finds that she is having to use this more then I want her to start using QVar one puff twice a day as empiric treatment for asthma.   Updated Medication List Outpatient Encounter Prescriptions as of 07/13/2012  Medication Sig Dispense Refill  . Aclidinium Bromide (TUDORZA PRESSAIR) 400 MCG/ACT AEPB Inhale into the lungs as needed.      [name] albuterol (PROVENTIL HFA;VENTOLIN HFA) 108 (90 BASE) MCG/ACT inhaler Inhale 2 puffs into the lungs as needed.      [name] amLODipine (NORVASC) 5 MG tablet Take 1 tablet (5 mg total) by mouth daily.  30 tablet  6  . carvedilol (COREG) 6.25 MG tablet Take 1 tablet (6.25 mg total) by mouth 2 (two) times daily.  60 tablet  6  . enalapril (VASOTEC) 20 MG tablet Take 1 tablet (20 mg total) by mouth 2 (two) times daily.  60 tablet  6  . furosemide (LASIX) 20 MG tablet Take 1 tablet (20 mg total) by mouth 2 (two) times daily.  60 tablet  6  . isosorbide mononitrate (IMDUR) 60 MG 24 hr tablet Take 1 tablet (60 mg total) by mouth daily.  30 tablet  6  . magnesium oxide (MAG-OX 400) 400 MG tablet Take 1 tablet (400 mg total) by mouth 2 (two) times daily.  60 tablet  3  . meloxicam (MOBIC) 7.5 MG tablet Take 7.5 mg by mouth as needed.       . metFORMIN (GLUMETZA) 500 MG (MOD) 24 hr tablet Take 1 tablet (500 mg total) by mouth 2 (two) times daily with a meal.  60 tablet  3  . NON FORMULARY Oxygen 2 liters.      . NON FORMULARY CPAP      . simvastatin (ZOCOR) 20 MG tablet Take 20 mg by mouth daily.      [name] spironolactone (ALDACTONE) 25 MG tablet Take 1 tablet (25 mg total) by mouth daily.  30 tablet  6  . aspirin 81 MG tablet Take 81 mg by mouth daily.      . [DISCONTINUED] azithromycin (ZITHROMAX Z-PAK) 250 MG tablet Take 2 pills day 1, then 1 pill daily days 2-5   6 each  0

## 2012-07-13 NOTE — Telephone Encounter (Signed)
Called patient to clarify that BP's were sent to office via my chart. Patient stated yes they were sent and she had an appoint with Dr. Lake Bells  and she would see [place] at the office

## 2012-07-13 NOTE — Telephone Encounter (Signed)
She has not sent me any information. Please call and get the B/P readings she took over the weekend. Thanks, Toinette Lackie

## 2012-07-13 NOTE — Assessment & Plan Note (Signed)
Due to her diastolic dysfunction, obesity, and deconditioning.  She does not have COPD.  However based on her intermittent shortness of breath I question asthma.  She has not had clear benefit from inhalers in the past.  She does not want to undergo more testing right now as she has been through a lot and feels that she is doing better.    I asked her to use albuterol as needed for dyspnea and ten minutes before exercise. If she finds that she is having to use this more then I want her to start using QVar one puff twice a day as empiric treatment for asthma.

## 2012-07-13 NOTE — Assessment & Plan Note (Signed)
There is no clear volume overload on exam today.  She appears to be doing well with her Na and fluid monitoring.  Plan: -continue strict fluid and Na restriction  -otherwise per Dr. Tyrell Antonio recommendations

## 2012-07-13 NOTE — Progress Notes (Deleted)
Subjective:    Patient ID: Debra Saunders, female    DOB: [birth year], 67 y.o.   MRN: [id number]  HPI   Debra Saunders is a very pleasant 67 year old female who comes to our clinic today for evaluation of pulmonary hypertension. She states that she was diagnosed with this in 2005 when she experienced increasing shortness of breath. During that time she had left leg swelling greater than the right and a lower charity Doppler ultrasound by report showed evidence of a prior lower extremity DVT but no current DVT. She was referred to Dr. Thom Chimes at [facility] and he said she had pulmonary hypertension because of CHF and obstructive sleep apnea. During that time she was started on CPAP therapy for obstructive sleep apnea which she continues to use.  She has not seen Dr. Gaynell Face in a number of years because she lost her insurance. She has continued to use 2 L of oxygen continuously since 2005 and CPAP. In the last several years she has gained at least 50 pounds and her exercise tolerance and shortness of breath has worsened. She now gets short of breath with just taking "a few steps" and cannot carry in groceries in her house. She often gets lightheaded with exertion. She was given a presumptive diagnosis of COPD based on the fact that she was dyspneic and on oxygen. She was given inhalers for this but it did not help. Of note her smoking history is very minimal and she says she only smoked about 10 cigarettes during the course of 3 months when she was a young adult.  Earlier this year she saw Dr. Fletcher Anon and underwent a left heart catheterization which showed normal coronary arteries and very high filling pressures of the left ventricle. There is also high systemic pressures. Since that time her antihypertensive therapies have been adjusted.  She is coming to our clinic for further evaluation and management of pulmonary hypertension.   In terms of her obstructive sleep apnea symptoms  she says that she does not feel well rested in the morning and she often falls asleep easily in the afternoon.    Past Medical History  Diagnosis Date  . Thyroid disorder   . Diabetes mellitus     type II  . Thyroid nodule   . Sleep apnea   . Obesity   . Hypertension   . Hyperlipidemia   . Urinary incontinence   . CHF (congestive heart failure) 2005    Dx at [facility]  . Chronic back pain   . COPD (chronic obstructive pulmonary disease)     2L [place]  . Sleep apnea      Family History  Problem Relation Age of Onset  . Heart disease Mother   . Hypertension Mother   . COPD Mother     was a smoker  . Thyroid disease Mother   . Hypertension Father   . Hypertension Sister   . Thyroid disease Sister   . Hypertension Sister   . Thyroid disease Sister   . Breast cancer Maternal Aunt      History   Social History  . Marital Status: Divorced    Spouse Name: N/A    Number of Children: N/A  . Years of Education: N/A   Occupational History  . Retired- factory work     exposed to [place]  . Smoking status: Former Smoker -- 0.3 packs/day for 2 years    Types: Cigarettes  Quit date: 06/23/1990  . Smokeless tobacco: Not on file  . Alcohol Use: No  . Drug Use: No  . Sexually Active: Not on file   Other Topics Concern  . Not on file   Social History Narrative   Lives in [place] with daughter. Has 4 children. No pets.Work - Restaurant manager, fast food, retiredDiet - regular     Allergies  Allergen Reactions  . Penicillins      Outpatient Prescriptions Prior to Visit  Medication Sig Dispense Refill  . Aclidinium Bromide (TUDORZA PRESSAIR) 400 MCG/ACT AEPB Inhale into the lungs as needed.      [name] albuterol (PROVENTIL HFA;VENTOLIN HFA) 108 (90 BASE) MCG/ACT inhaler Inhale 2 puffs into the lungs as needed.      [name] amLODipine (NORVASC) 5 MG tablet Take 1 tablet (5 mg total) by mouth daily.  30 tablet  6  . carvedilol (COREG) 6.25 MG tablet Take 1 tablet  (6.25 mg total) by mouth 2 (two) times daily.  60 tablet  6  . enalapril (VASOTEC) 20 MG tablet Take 1 tablet (20 mg total) by mouth 2 (two) times daily.  60 tablet  6  . furosemide (LASIX) 20 MG tablet Take 1 tablet (20 mg total) by mouth 2 (two) times daily.  60 tablet  6  . isosorbide mononitrate (IMDUR) 60 MG 24 hr tablet Take 1 tablet (60 mg total) by mouth daily.  30 tablet  6  . magnesium oxide (MAG-OX 400) 400 MG tablet Take 1 tablet (400 mg total) by mouth 2 (two) times daily.  60 tablet  3  . meloxicam (MOBIC) 7.5 MG tablet Take 7.5 mg by mouth as needed.       . metFORMIN (GLUMETZA) 500 MG (MOD) 24 hr tablet Take 1 tablet (500 mg total) by mouth 2 (two) times daily with a meal.  60 tablet  3  . NON FORMULARY Oxygen 2 liters.      . NON FORMULARY CPAP      . simvastatin (ZOCOR) 20 MG tablet Take 20 mg by mouth daily.      [name] spironolactone (ALDACTONE) 25 MG tablet Take 1 tablet (25 mg total) by mouth daily.  30 tablet  6  . aspirin 81 MG tablet Take 81 mg by mouth daily.      . [DISCONTINUED] azithromycin (ZITHROMAX Z-PAK) 250 MG tablet Take 2 pills day 1, then 1 pill daily days 2-5  6 each  0  Last reviewed on 07/13/2012  1:42 PM by Rosana Berger, CMA    Review of Systems  Constitutional: Negative for fever, chills, diaphoresis, appetite change and fatigue.  HENT: Negative for hearing loss, nosebleeds, congestion, sore throat, rhinorrhea, trouble swallowing, neck stiffness, postnasal drip and sinus pressure.   Eyes: Negative for discharge, redness and visual disturbance.  Respiratory: Positive for chest tightness and shortness of breath. Negative for cough, choking and wheezing.   Cardiovascular: Positive for leg swelling. Negative for chest pain.  Gastrointestinal: Negative for nausea, abdominal pain, diarrhea, constipation and blood in stool.  Genitourinary: Negative for dysuria, frequency and hematuria.  Musculoskeletal: Negative for myalgias, joint swelling and arthralgias.    Skin: Negative for color change, pallor and rash.  Neurological: Negative for dizziness, seizures, facial asymmetry, speech difficulty, light-headedness, numbness and headaches.  Hematological: Negative for adenopathy. Does not bruise/bleed easily.       Objective:   Physical Exam   Filed Vitals:   07/13/12 1343  BP: 140/86  Pulse: 58  Temp: 98.2 F (  36.8 C)  TempSrc: Oral  Weight: 217 lb (98.431 kg)  SpO2: 97%  2L [place]  Walked 950 feet during a 6MW and O2 saturation stayed > 92% on 2 L throughout  Gen: morbidly obese, no acute distress HEENT: NCAT, PERRL, EOMi, OP clear, neck supple without masses PULM: Few insp crackles bases bilatearlly CV: RRR, distant heart sounds, no JVD AB: BS+, soft, trace tender, no hsm Ext: warm, no edema, no clubbing, no cyanosis Derm: no rash or skin breakdown Neuro: A&Ox4, CN II-XII intact, strength 5/5 in all 4 extremities      Assessment & Plan:   No problem-specific assessment & plan notes found for this encounter.   Updated Medication List Outpatient Encounter Prescriptions as of 07/13/2012  Medication Sig Dispense Refill  . Aclidinium Bromide (TUDORZA PRESSAIR) 400 MCG/ACT AEPB Inhale into the lungs as needed.      [name] albuterol (PROVENTIL HFA;VENTOLIN HFA) 108 (90 BASE) MCG/ACT inhaler Inhale 2 puffs into the lungs as needed.      [name] amLODipine (NORVASC) 5 MG tablet Take 1 tablet (5 mg total) by mouth daily.  30 tablet  6  . carvedilol (COREG) 6.25 MG tablet Take 1 tablet (6.25 mg total) by mouth 2 (two) times daily.  60 tablet  6  . enalapril (VASOTEC) 20 MG tablet Take 1 tablet (20 mg total) by mouth 2 (two) times daily.  60 tablet  6  . furosemide (LASIX) 20 MG tablet Take 1 tablet (20 mg total) by mouth 2 (two) times daily.  60 tablet  6  . isosorbide mononitrate (IMDUR) 60 MG 24 hr tablet Take 1 tablet (60 mg total) by mouth daily.  30 tablet  6  . magnesium oxide (MAG-OX 400) 400 MG tablet Take 1 tablet (400 mg total) by mouth 2  (two) times daily.  60 tablet  3  . meloxicam (MOBIC) 7.5 MG tablet Take 7.5 mg by mouth as needed.       . metFORMIN (GLUMETZA) 500 MG (MOD) 24 hr tablet Take 1 tablet (500 mg total) by mouth 2 (two) times daily with a meal.  60 tablet  3  . NON FORMULARY Oxygen 2 liters.      . NON FORMULARY CPAP      . simvastatin (ZOCOR) 20 MG tablet Take 20 mg by mouth daily.      [name] spironolactone (ALDACTONE) 25 MG tablet Take 1 tablet (25 mg total) by mouth daily.  30 tablet  6  . aspirin 81 MG tablet Take 81 mg by mouth daily.      . [DISCONTINUED] azithromycin (ZITHROMAX Z-PAK) 250 MG tablet Take 2 pills day 1, then 1 pill daily days 2-5  6 each  0

## 2012-07-13 NOTE — Assessment & Plan Note (Signed)
She is to continue using CPAP at 13cm of H20 at home as she is doing.  This seems to be working well for her.

## 2012-07-16 LAB — HM DIABETES EYE EXAM

## 2012-07-22 ENCOUNTER — Ambulatory Visit (INDEPENDENT_AMBULATORY_CARE_PROVIDER_SITE_OTHER): Payer: Medicare Other | Admitting: Cardiovascular Disease

## 2012-07-22 ENCOUNTER — Encounter: Payer: Self-pay | Admitting: Cardiovascular Disease

## 2012-07-22 VITALS — BP 170/90 | HR 54 | Ht 62.0 in | Wt 221.5 lb

## 2012-07-22 DIAGNOSIS — I10 Essential (primary) hypertension: Secondary | ICD-10-CM

## 2012-07-22 DIAGNOSIS — I509 Heart failure, unspecified: Secondary | ICD-10-CM

## 2012-07-22 MED ORDER — CHLORTHALIDONE 25 MG PO TABS: 25.0000 mg | ORAL_TABLET | Freq: Every day | ORAL | Status: DC

## 2012-07-22 NOTE — Patient Instructions (Addendum)
Stop Furosemide (Lasix) Stop Imdur .  Start Chlorthalidone 25 mg once daily.  Follow up in 2 weeks.

## 2012-07-22 NOTE — Progress Notes (Signed)
HPI  This is a 67 -year-old female who was here today for a followup visit regarding chronic diastolic heart failure with pulmonary hypertension. She has chronic left lower extremity swelling which has been evaluated in the past with venous duplex ultrasound on multiple occasions and was negative for DVT. Her echocardiogram  showed normal LV systolic function with mild diastolic dysfunction and evidence of elevated filling pressures. There was also moderate to severe pulmonary hypertension with peak systolic pressure of 56 mm Hg. she underwent cardiac catheterization in August of 2013 which showed no evidence of obstructive coronary artery disease. She was noted to be severely hypertensive with significant elevation in left ventricular end-diastolic pressure. She was noted to be hypokalemic with a potassium of 3 on her precath labs. Spironolactone 25 mg once daily was added in addition to Lasix 20 mg twice daily .  She has been on home oxygen due to documented hypoxia. She follows up with Dr. [place] Bells as well. She was noted recently to have significantly elevated blood pressure. She is not tolerating Imdur due to headache.  She was taking Lasix once daily but that was recently changed to twice daily. Initially blood pressure improved but now it is going up again. She did not feel well when her blood pressure is high.     Allergies  Allergen Reactions  . Penicillins      Current Outpatient Prescriptions on File Prior to Visit  Medication Sig Dispense Refill  . Aclidinium Bromide (TUDORZA PRESSAIR) 400 MCG/ACT AEPB Inhale into the lungs as needed.      [name] albuterol (PROVENTIL HFA;VENTOLIN HFA) 108 (90 BASE) MCG/ACT inhaler Inhale 2 puffs into the lungs as needed.      [name] amLODipine (NORVASC) 5 MG tablet Take 1 tablet (5 mg total) by mouth daily.  30 tablet  6  . aspirin 81 MG tablet Take 81 mg by mouth daily.      . beclomethasone (QVAR) 80 MCG/ACT inhaler Inhale 1 puff into the lungs as needed.       . carvedilol (COREG) 6.25 MG tablet Take 1 tablet (6.25 mg total) by mouth 2 (two) times daily.  60 tablet  6  . enalapril (VASOTEC) 20 MG tablet Take 1 tablet (20 mg total) by mouth 2 (two) times daily.  60 tablet  6  . magnesium oxide (MAG-OX 400) 400 MG tablet Take 1 tablet (400 mg total) by mouth 2 (two) times daily.  60 tablet  3  . meloxicam (MOBIC) 7.5 MG tablet Take 7.5 mg by mouth as needed.       . metFORMIN (GLUMETZA) 500 MG (MOD) 24 hr tablet Take 1 tablet (500 mg total) by mouth 2 (two) times daily with a meal.  60 tablet  3  . NON FORMULARY Oxygen 2 liters.      . NON FORMULARY CPAP      . simvastatin (ZOCOR) 20 MG tablet Take 20 mg by mouth daily.      [name] spironolactone (ALDACTONE) 25 MG tablet Take 1 tablet (25 mg total) by mouth daily.  30 tablet  6  . chlorthalidone (HYGROTON) 25 MG tablet Take 1 tablet (25 mg total) by mouth daily.  30 tablet  6     Past Medical History  Diagnosis Date  . Thyroid disorder   . Diabetes mellitus     type II  . Thyroid nodule   . Sleep apnea   . Obesity   . Hypertension   . Hyperlipidemia   .  Urinary incontinence   . CHF (congestive heart failure) 2005    Dx at [facility]  . Chronic back pain   . COPD (chronic obstructive pulmonary disease)     2L [place]  . Sleep apnea      Past Surgical History  Procedure Date  . Cholecystectomy   . Vaginal hysterectomy   . Cardiac catheterization 2013    @ [facility]: No obstructive CAD: Only 20% ostial left circumflex.  . Abdominal hysterectomy 1969     Family History  Problem Relation Age of Onset  . Heart disease Mother   . Hypertension Mother   . COPD Mother     was a smoker  . Thyroid disease Mother   . Hypertension Father   . Hypertension Sister   . Thyroid disease Sister   . Hypertension Sister   . Thyroid disease Sister   . Breast cancer Maternal Aunt      History   Social History  . Marital Status: Divorced    Spouse Name: N/A    Number of Children: N/A  . Years of  Education: N/A   Occupational History  . 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 Smoking status: Former Smoker -- 0.3 packs/day for 2 years    Types: Cigarettes    Quit date: 06/23/1990  . Smokeless tobacco: Not on file  . Alcohol Use: No  . Drug Use: No  . Sexually Active: Not on file   Other Topics Concern  . Not on file   Social History Narrative   Lives in [place] with daughter. Has 4 children. No pets.Work - Restaurant manager, fast food, retiredDiet - regular       PHYSICAL EXAM   BP 170/90  Pulse 54  Ht 5\' 2"  (1.575 m)  Wt 221 lb 8 oz (100.472 kg)  BMI 40.51 kg/m2 Constitutional: She is oriented to person, place, and time. She appears well-developed and well-nourished. No distress.  HENT: No nasal discharge.  Head: Normocephalic and atraumatic.  Eyes: Pupils are equal and round. Right eye exhibits no discharge. Left eye exhibits no discharge.  Neck: Normal range of motion. Neck supple. No JVD present. No thyromegaly present.  Cardiovascular: Normal rate, regular rhythm, normal heart sounds. Exam reveals no gallop and no friction rub. No murmur heard.  Pulmonary/Chest: Effort normal and breath sounds normal. No stridor. No respiratory distress. She has no wheezes. She has no rales. She exhibits no tenderness.  Abdominal: Soft. Bowel sounds are normal. She exhibits no distension. There is no tenderness. There is no rebound and no guarding.  Musculoskeletal: Normal range of motion. She exhibits chronic left lower extremity edema and no tenderness.  Neurological: She is alert and oriented to person, place, and time. Coordination normal.  Skin: Skin is warm and dry. No rash noted. She is not diaphoretic. No erythema. No pallor.  Psychiatric: She has a normal mood and affect. Her behavior is normal. Judgment and thought content normal.     EKG: Sinus  Bradycardia  -with Pause  Low voltage in precordial leads.   -  Nonspecific  T-abnormality.   ABNORMAL    ASSESSMENT AND PLAN

## 2012-07-22 NOTE — Assessment & Plan Note (Signed)
Since due to chronic diastolic heart failure. She appears to be euvolemic at this time but we will need to control her blood pressure.

## 2012-07-22 NOTE — Assessment & Plan Note (Addendum)
She has refractory hypertension. I am not able to increase the dose of carvedilol due to baseline bradycardia. I will try to avoid increasing amlodipine given her chronic lower extremity edema. I will switch her from for Lasix to a thiazide diuretic to see if we can control her blood pressure better. I started her on chlorthalidone 25 mg once daily. She might not tolerate being off loop diuretic but will reevaluate her in few weeks. If her blood pressure remains elevated in spite of this, I will likely add hydralazine. Imdur was stopped today given her significant headache every time she takes it.

## 2012-07-24 ENCOUNTER — Encounter: Payer: Self-pay | Admitting: Pulmonary Disease

## 2012-07-27 ENCOUNTER — Encounter: Payer: Self-pay | Admitting: Internal Medicine

## 2012-07-27 ENCOUNTER — Ambulatory Visit (INDEPENDENT_AMBULATORY_CARE_PROVIDER_SITE_OTHER): Payer: Medicare Other | Admitting: Internal Medicine

## 2012-07-27 VITALS — BP 130/78 | HR 52 | Temp 98.1°F | Ht 62.0 in | Wt 218.0 lb

## 2012-07-27 DIAGNOSIS — D649 Anemia, unspecified: Secondary | ICD-10-CM

## 2012-07-27 DIAGNOSIS — I10 Essential (primary) hypertension: Secondary | ICD-10-CM

## 2012-07-27 DIAGNOSIS — E119 Type 2 diabetes mellitus without complications: Secondary | ICD-10-CM

## 2012-07-27 DIAGNOSIS — E042 Nontoxic multinodular goiter: Secondary | ICD-10-CM

## 2012-07-27 DIAGNOSIS — Z23 Encounter for immunization: Secondary | ICD-10-CM

## 2012-07-27 LAB — COMPREHENSIVE METABOLIC PANEL WITH GFR
ALT: 30 U/L (ref 0–35)
AST: 30 U/L (ref 0–37)
Albumin: 3.6 g/dL (ref 3.5–5.2)
Alkaline Phosphatase: 93 U/L (ref 39–117)
BUN: 19 mg/dL (ref 6–23)
CO2: 28 meq/L (ref 19–32)
Calcium: 9.4 mg/dL (ref 8.4–10.5)
Chloride: 100 meq/L (ref 96–112)
Creatinine, Ser: 0.8 mg/dL (ref 0.4–1.2)
GFR: 94.16 mL/min
Glucose, Bld: 116 mg/dL — ABNORMAL HIGH (ref 70–99)
Potassium: 4.1 meq/L (ref 3.5–5.1)
Sodium: 136 meq/L (ref 135–145)
Total Bilirubin: 0.4 mg/dL (ref 0.3–1.2)
Total Protein: 8.1 g/dL (ref 6.0–8.3)

## 2012-07-27 LAB — HEMOGLOBIN A1C: Hgb A1c MFr Bld: 6.4 % (ref 4.6–6.5)

## 2012-07-27 MED ORDER — CYANOCOBALAMIN 1000 MCG/ML IJ SOLN
1000.0000 ug | Freq: Once | INTRAMUSCULAR | Status: AC
  Administered 2012-07-27: 1000 ug via INTRAMUSCULAR

## 2012-07-27 NOTE — Assessment & Plan Note (Signed)
BP Readings from Last 3 Encounters:  07/27/12 130/78  07/22/12 170/90  07/13/12 140/86   Blood pressure very well-controlled with change to chlorthalidone. Will continue. Followup 3 months.

## 2012-07-27 NOTE — Assessment & Plan Note (Signed)
Lab Results  Component Value Date   HGBA1C 6.4 07/27/2012   Blood sugars well-controlled on metformin alone. Will continue. Followup in 3 months.

## 2012-07-27 NOTE — Progress Notes (Signed)
Subjective:    Patient ID: Debra Saunders, female    DOB: Oct 10, 1945, 67 y.o.   MRN: [id number]  HPI 67 year old female with history of diabetes mellitus, pulmonary hypertension, congestive heart failure, hypertension, chronic venous insufficiency presents for followup. She reports she is generally feeling well. She notes that blood sugars have been fairly well-controlled with only a couple of readings above 200. She reports compliance with medications. She notes that her cardiologist recently changed blood pressure medicine to chlorthalidone. She reports no change in lower extremity edema with change of this medication. She denies any new concerns today.  Outpatient Prescriptions Prior to Visit  Medication Sig Dispense Refill  . Aclidinium Bromide (TUDORZA PRESSAIR) 400 MCG/ACT AEPB Inhale into the lungs as needed.      [name] albuterol (PROVENTIL HFA;VENTOLIN HFA) 108 (90 BASE) MCG/ACT inhaler Inhale 2 puffs into the lungs as needed.      [name] amLODipine (NORVASC) 5 MG tablet Take 1 tablet (5 mg total) by mouth daily.  30 tablet  6  . aspirin 81 MG tablet Take 81 mg by mouth daily.      . beclomethasone (QVAR) 80 MCG/ACT inhaler Inhale 1 puff into the lungs as needed.      . carvedilol (COREG) 6.25 MG tablet Take 1 tablet (6.25 mg total) by mouth 2 (two) times daily.  60 tablet  6  . chlorthalidone (HYGROTON) 25 MG tablet Take 1 tablet (25 mg total) by mouth daily.  30 tablet  6  . enalapril (VASOTEC) 20 MG tablet Take 1 tablet (20 mg total) by mouth 2 (two) times daily.  60 tablet  6  . magnesium oxide (MAG-OX 400) 400 MG tablet Take 1 tablet (400 mg total) by mouth 2 (two) times daily.  60 tablet  3  . meloxicam (MOBIC) 7.5 MG tablet Take 7.5 mg by mouth as needed.       . metFORMIN (GLUMETZA) 500 MG (MOD) 24 hr tablet Take 1 tablet (500 mg total) by mouth 2 (two) times daily with a meal.  60 tablet  3  . NON FORMULARY Oxygen 2 liters.      . NON FORMULARY CPAP      . simvastatin (ZOCOR) 20 MG  tablet Take 20 mg by mouth daily.      [name] spironolactone (ALDACTONE) 25 MG tablet Take 1 tablet (25 mg total) by mouth daily.  30 tablet  6   Last reviewed on 07/27/2012 10:58 AM by Olivia Mackie, RN  BP 130/78  Pulse 52  Temp 98.1 F (36.7 C) (Oral)  Ht 5\' 2"  (1.575 m)  Wt 218 lb (98.884 kg)  BMI 39.87 kg/m2  SpO2 97%  Review of Systems  Constitutional: Negative for fever, chills, appetite change, fatigue and unexpected weight change.  HENT: Negative for ear pain, congestion, sore throat, trouble swallowing, neck pain, voice change and sinus pressure.   Eyes: Negative for visual disturbance.  Respiratory: Negative for cough, shortness of breath, wheezing and stridor.   Cardiovascular: Negative for chest pain, palpitations and leg swelling.  Gastrointestinal: Negative for nausea, vomiting, abdominal pain, diarrhea, constipation, blood in stool, abdominal distention and anal bleeding.  Genitourinary: Negative for dysuria and flank pain.  Musculoskeletal: Negative for myalgias, arthralgias and gait problem.  Skin: Negative for color change and rash.  Neurological: Negative for dizziness and headaches.  Hematological: Negative for adenopathy. Does not bruise/bleed easily.  Psychiatric/Behavioral: Negative for suicidal ideas, sleep disturbance and dysphoric mood. The patient is not nervous/anxious.  Objective:   Physical Exam  Constitutional: She is oriented to person, place, and time. She appears well-developed and well-nourished. No distress.  HENT:  Head: Normocephalic and atraumatic.  Right Ear: External ear normal.  Left Ear: External ear normal.  Nose: Nose normal.  Mouth/Throat: Oropharynx is clear and moist. No oropharyngeal exudate.  Eyes: Conjunctivae normal are normal. Pupils are equal, round, and reactive to light. Right eye exhibits no discharge. Left eye exhibits no discharge. No scleral icterus.  Neck: Normal range of motion. Neck supple. No tracheal  deviation present. No thyromegaly present.  Cardiovascular: Normal rate, regular rhythm, normal heart sounds and intact distal pulses.  Exam reveals no gallop and no friction rub.   No murmur heard. Pulmonary/Chest: Effort normal. No accessory muscle usage. Not tachypneic. No respiratory distress. She has decreased breath sounds. She has no wheezes. She has no rhonchi. She has no rales. She exhibits no tenderness.  Musculoskeletal: Normal range of motion. She exhibits edema (non-pitting left LE to knee). She exhibits no tenderness.  Lymphadenopathy:    She has no cervical adenopathy.  Neurological: She is alert and oriented to person, place, and time. No cranial nerve deficit. She exhibits normal muscle tone. Coordination normal.  Skin: Skin is warm and dry. No rash noted. She is not diaphoretic. No erythema. No pallor.  Psychiatric: She has a normal mood and affect. Her behavior is normal. Judgment and thought content normal.          Assessment & Plan:

## 2012-07-27 NOTE — Assessment & Plan Note (Signed)
Pernicious anemia. B12 shot given today. We'll plan to repeat blood counts in 3 months.

## 2012-07-29 ENCOUNTER — Ambulatory Visit: Payer: Self-pay | Admitting: Internal Medicine

## 2012-07-29 ENCOUNTER — Telehealth: Payer: Self-pay | Admitting: Internal Medicine

## 2012-07-29 DIAGNOSIS — E042 Nontoxic multinodular goiter: Secondary | ICD-10-CM

## 2012-07-29 NOTE — Telephone Encounter (Signed)
Ultrasound of the neck showed multiple bilateral thyroid nodules. 2 of the nodules appeared to be solid, the largest measuring 2cm in diameter. I would recommend setting up ENT evaluation for possible biopsy.

## 2012-07-30 ENCOUNTER — Observation Stay: Payer: Self-pay | Admitting: Internal Medicine

## 2012-07-30 ENCOUNTER — Telehealth: Payer: Self-pay | Admitting: Internal Medicine

## 2012-07-30 DIAGNOSIS — I498 Other specified cardiac arrhythmias: Secondary | ICD-10-CM

## 2012-07-30 LAB — CBC WITH DIFFERENTIAL/PLATELET
Basophil #: 0.1 10*3/uL (ref 0.0–0.1)
Basophil %: 1.1 %
Eosinophil #: 0.1 10*3/uL (ref 0.0–0.7)
Eosinophil %: 2 %
HCT: 36.3 % (ref 35.0–47.0)
HGB: 11.7 g/dL — ABNORMAL LOW (ref 12.0–16.0)
Lymphocyte #: 2 10*3/uL (ref 1.0–3.6)
Lymphocyte %: 28.7 %
MCH: 27 pg (ref 26.0–34.0)
MCHC: 32.3 g/dL (ref 32.0–36.0)
MCV: 84 fL (ref 80–100)
Monocyte #: 0.4 x10 3/mm (ref 0.2–0.9)
Monocyte %: 6 %
Neutrophil #: 4.2 10*3/uL (ref 1.4–6.5)
Neutrophil %: 62.2 %
Platelet: 262 10*3/uL (ref 150–440)
RBC: 4.35 10*6/uL (ref 3.80–5.20)
RDW: 14.4 % (ref 11.5–14.5)
WBC: 6.8 10*3/uL (ref 3.6–11.0)

## 2012-07-30 LAB — URINALYSIS, COMPLETE
Bacteria: NONE SEEN
Bilirubin,UR: NEGATIVE
Blood: NEGATIVE
Glucose,UR: NEGATIVE mg/dL (ref 0–75)
Ketone: NEGATIVE
Leukocyte Esterase: NEGATIVE
Nitrite: NEGATIVE
Ph: 5 (ref 4.5–8.0)
Protein: NEGATIVE
RBC,UR: 1 /HPF (ref 0–5)
Specific Gravity: 1.017 (ref 1.003–1.030)
Squamous Epithelial: <1
WBC UR: <1 /HPF (ref 0–5)

## 2012-07-30 LAB — COMPREHENSIVE METABOLIC PANEL
Albumin: 3.4 g/dL (ref 3.4–5.0)
Alkaline Phosphatase: 119 U/L (ref 50–136)
Anion Gap: 8 (ref 7–16)
BUN: 22 mg/dL — ABNORMAL HIGH (ref 7–18)
Bilirubin,Total: 0.3 mg/dL (ref 0.2–1.0)
Calcium, Total: 8.9 mg/dL (ref 8.5–10.1)
Chloride: 100 mmol/L (ref 98–107)
Co2: 27 mmol/L (ref 21–32)
Creatinine: 0.78 mg/dL (ref 0.60–1.30)
EGFR (African American): >60
EGFR (Non-African Amer.): >60
Glucose: 138 mg/dL — ABNORMAL HIGH (ref 65–99)
Osmolality: 276 (ref 275–301)
Potassium: 3.9 mmol/L (ref 3.5–5.1)
SGOT(AST): 31 U/L (ref 15–37)
SGPT (ALT): 29 U/L (ref 12–78)
Sodium: 135 mmol/L — ABNORMAL LOW (ref 136–145)
Total Protein: 8.2 g/dL (ref 6.4–8.2)

## 2012-07-30 LAB — LIPID PANEL
Cholesterol: 152 mg/dL (ref 0–200)
HDL Cholesterol: 51 mg/dL (ref 40–60)
Ldl Cholesterol, Calc: 70 mg/dL (ref 0–100)
Triglycerides: 153 mg/dL (ref 0–200)
VLDL Cholesterol, Calc: 31 mg/dL (ref 5–40)

## 2012-07-30 LAB — CK TOTAL AND CKMB (NOT AT ARMC)
CK, Total: 99 U/L (ref 21–215)
CK-MB: 0.6 ng/mL (ref 0.5–3.6)

## 2012-07-30 LAB — TROPONIN I
Troponin-I: <0.02 ng/mL
Troponin-I: <0.02 ng/mL

## 2012-07-30 LAB — HEMOGLOBIN A1C: Hemoglobin A1C: 5.9 % (ref 4.2–6.3)

## 2012-07-30 NOTE — Telephone Encounter (Signed)
.  left message to have patient return my call.  

## 2012-07-30 NOTE — Telephone Encounter (Signed)
Received notice from Pulmonary Rehab that pt was feeling poorly with dizziness. Heart rate was 40s to 50s. EKG was performed showing sinus bradycardia.  Can you please call pt and make sure she was evaluated further in ED? No indication of this on notes we received.

## 2012-07-30 NOTE — Telephone Encounter (Signed)
LMTCB

## 2012-07-31 LAB — CBC WITH DIFFERENTIAL/PLATELET
Basophil #: 0.1 10*3/uL (ref 0.0–0.1)
Basophil %: 1.3 %
Eosinophil #: 0.2 10*3/uL (ref 0.0–0.7)
Eosinophil %: 2.6 %
HCT: 36.1 % (ref 35.0–47.0)
HGB: 11.4 g/dL — ABNORMAL LOW (ref 12.0–16.0)
Lymphocyte #: 2 10*3/uL (ref 1.0–3.6)
Lymphocyte %: 30.9 %
MCH: 26.5 pg (ref 26.0–34.0)
MCHC: 31.6 g/dL — ABNORMAL LOW (ref 32.0–36.0)
MCV: 84 fL (ref 80–100)
Monocyte #: 0.5 x10 3/mm (ref 0.2–0.9)
Monocyte %: 7.1 %
Neutrophil #: 3.8 10*3/uL (ref 1.4–6.5)
Neutrophil %: 58.1 %
Platelet: 261 10*3/uL (ref 150–440)
RBC: 4.31 10*6/uL (ref 3.80–5.20)
RDW: 14.2 % (ref 11.5–14.5)
WBC: 6.5 10*3/uL (ref 3.6–11.0)

## 2012-07-31 LAB — BASIC METABOLIC PANEL
Anion Gap: 7 (ref 7–16)
BUN: 20 mg/dL — ABNORMAL HIGH (ref 7–18)
Calcium, Total: 9 mg/dL (ref 8.5–10.1)
Chloride: 98 mmol/L (ref 98–107)
Co2: 30 mmol/L (ref 21–32)
Creatinine: 0.81 mg/dL (ref 0.60–1.30)
EGFR (African American): >60
EGFR (Non-African Amer.): >60
Glucose: 123 mg/dL — ABNORMAL HIGH (ref 65–99)
Osmolality: 274 (ref 275–301)
Potassium: 3.3 mmol/L — ABNORMAL LOW (ref 3.5–5.1)
Sodium: 135 mmol/L — ABNORMAL LOW (ref 136–145)

## 2012-07-31 LAB — TROPONIN I: Troponin-I: <0.02 ng/mL

## 2012-07-31 LAB — CK TOTAL AND CKMB (NOT AT ARMC)
CK, Total: 96 U/L (ref 21–215)
CK-MB: <0.5 ng/mL — ABNORMAL LOW (ref 0.5–3.6)

## 2012-08-02 NOTE — Telephone Encounter (Signed)
Patient informed of U/S results and agreed with seeing an ENT. How would she go about doing this, if it is a referral she stated just go ahead set up and call her with the appointment information.

## 2012-08-02 NOTE — Telephone Encounter (Signed)
Patient returned call, she has been in the hospital at [facility]. She stayed only 1 night and they said her HR did drop while she was sleeping but once she got up and began moving around, it did increase and they discharged her. They took her off the Carvedilol.

## 2012-08-06 ENCOUNTER — Ambulatory Visit: Payer: Medicare Other | Admitting: Cardiovascular Disease

## 2012-08-10 ENCOUNTER — Ambulatory Visit (INDEPENDENT_AMBULATORY_CARE_PROVIDER_SITE_OTHER): Payer: Medicare Other | Admitting: Cardiovascular Disease

## 2012-08-10 ENCOUNTER — Encounter: Payer: Self-pay | Admitting: Internal Medicine

## 2012-08-10 ENCOUNTER — Encounter: Payer: Self-pay | Admitting: Cardiovascular Disease

## 2012-08-10 VITALS — BP 150/60 | HR 72 | Ht 62.0 in | Wt 218.5 lb

## 2012-08-10 DIAGNOSIS — R0602 Shortness of breath: Secondary | ICD-10-CM

## 2012-08-10 DIAGNOSIS — R42 Dizziness and giddiness: Secondary | ICD-10-CM

## 2012-08-10 DIAGNOSIS — I498 Other specified cardiac arrhythmias: Secondary | ICD-10-CM

## 2012-08-10 DIAGNOSIS — I509 Heart failure, unspecified: Secondary | ICD-10-CM

## 2012-08-10 DIAGNOSIS — I10 Essential (primary) hypertension: Secondary | ICD-10-CM

## 2012-08-10 DIAGNOSIS — R001 Bradycardia, unspecified: Secondary | ICD-10-CM | POA: Insufficient documentation

## 2012-08-10 NOTE — Assessment & Plan Note (Signed)
Due to diastolic heart failure. She appears to be euvolemic and current regimen of spironolactone and chlorthalidone. We will avoid beta blockers or calcium channel blockers due to bradycardia.

## 2012-08-10 NOTE — Patient Instructions (Addendum)
Continue same medications.  Follow up in 6 months.  

## 2012-08-10 NOTE — Assessment & Plan Note (Signed)
This resolved after stopping carvedilol recently. Continue to monitor. No indication for a pacemaker at this time.

## 2012-08-10 NOTE — Progress Notes (Signed)
HPI  This is a 67 -year-old female who was here today for a followup visit regarding chronic diastolic heart failure with pulmonary hypertension and recent hospitalization for dizziness and bradycardia. She has chronic left lower extremity swelling which has been evaluated in the past with venous duplex ultrasound on multiple occasions and was negative for DVT. Her echocardiogram  showed normal LV systolic function with mild diastolic dysfunction and evidence of elevated filling pressures. There was also moderate to severe pulmonary hypertension with peak systolic pressure of 56 mm Hg. she underwent cardiac catheterization in August of 2013 which showed no evidence of obstructive coronary artery disease. She was noted to be severely hypertensive with significant elevation in left ventricular end-diastolic pressure.  She has been on home oxygen due to documented hypoxia. She follows up with Dr. Lake Bells as well.  She is attending pulmonary rehabilitation. I saw her recently for elevated blood pressure. I switched her from Lasix to chlorthalidone. She had significant improvement in blood pressure since then. About 10 days ago while at pulmonary rehabilitation she was feeling nauseous and sick in her stomach. She feels dizzy and lightheaded and was found to have a heart rate of 40 beats per minute. She was hospitalized at [facility] overnight. Carvedilol was stopped. The bradycardia resolved. She is feeling better. She still has occasional dizziness but not as bad as before.   Allergies  Allergen Reactions  . Penicillins      Current Outpatient Prescriptions on File Prior to Visit  Medication Sig Dispense Refill  . Aclidinium Bromide (TUDORZA PRESSAIR) 400 MCG/ACT AEPB Inhale into the lungs as needed.      [name] albuterol (PROVENTIL HFA;VENTOLIN HFA) 108 (90 BASE) MCG/ACT inhaler Inhale 2 puffs into the lungs as needed.      [name] amLODipine (NORVASC) 5 MG tablet Take 1 tablet (5 mg total) by mouth daily.  30  tablet  6  . aspirin 81 MG tablet Take 81 mg by mouth daily.      . beclomethasone (QVAR) 80 MCG/ACT inhaler Inhale 1 puff into the lungs as needed.      . chlorthalidone (HYGROTON) 25 MG tablet Take 1 tablet (25 mg total) by mouth daily.  30 tablet  6  . enalapril (VASOTEC) 20 MG tablet Take 1 tablet (20 mg total) by mouth 2 (two) times daily.  60 tablet  6  . magnesium oxide (MAG-OX 400) 400 MG tablet Take 1 tablet (400 mg total) by mouth 2 (two) times daily.  60 tablet  3  . meloxicam (MOBIC) 7.5 MG tablet Take 7.5 mg by mouth as needed.       . metFORMIN (GLUMETZA) 500 MG (MOD) 24 hr tablet Take 1 tablet (500 mg total) by mouth 2 (two) times daily with a meal.  60 tablet  3  . NON FORMULARY Oxygen 2 liters.      . NON FORMULARY CPAP      . simvastatin (ZOCOR) 20 MG tablet Take 20 mg by mouth daily.      [name] spironolactone (ALDACTONE) 25 MG tablet Take 1 tablet (25 mg total) by mouth daily.  30 tablet  6   No current facility-administered medications on file prior to visit.     Past Medical History  Diagnosis Date  . Thyroid disorder   . Diabetes mellitus     type II  . Thyroid nodule   . Sleep apnea   . Obesity   . Hypertension   . Hyperlipidemia   .  Urinary incontinence   . CHF (congestive heart failure) 2005    Dx at [facility]  . Chronic back pain   . COPD (chronic obstructive pulmonary disease)     2L [place]  . Sleep apnea      Past Surgical History  Procedure Laterality Date  . Cholecystectomy    . Vaginal hysterectomy    . Cardiac catheterization  2013    @ [place]: No obstructive CAD: Only 20% ostial left circumflex.  . Abdominal hysterectomy  1969     Family History  Problem Relation Age of Onset  . Heart disease Mother   . Hypertension Mother   . COPD Mother     was a smoker  . Thyroid disease Mother   . Hypertension Father   . Hypertension Sister   . Thyroid disease Sister   . Hypertension Sister   . Thyroid disease Sister   . Breast cancer Maternal Aunt       History   Social History  . Marital Status: Divorced    Spouse Name: N/A    Number of Children: N/A  . Years of Education: N/A   Occupational History  . Retired- factory work     exposed to [place]  . Smoking status: Former Smoker -- 0.30 packs/day for 2 years    Types: Cigarettes    Quit date: 06/23/1990  . Smokeless tobacco: Not on file  . Alcohol Use: No  . Drug Use: No  . Sexually Active: Not on file   Other Topics Concern  . Not on file   Social History Narrative   Lives in [place] with daughter. Has 4 children. No pets.      Work - Restaurant manager, fast food, retired      Diet - regular       PHYSICAL EXAM   BP 150/60  Pulse 72  Ht 5\' 2"  (1.575 m)  Wt 218 lb 8 oz (99.111 kg)  BMI 39.95 kg/m2 Constitutional: She is oriented to person, place, and time. She appears well-developed and well-nourished. No distress.  HENT: No nasal discharge.  Head: Normocephalic and atraumatic.  Eyes: Pupils are equal and round. Right eye exhibits no discharge. Left eye exhibits no discharge.  Neck: Normal range of motion. Neck supple. No JVD present. No thyromegaly present.  Cardiovascular: Normal rate, regular rhythm, normal heart sounds. Exam reveals no gallop and no friction rub. No murmur heard.  Pulmonary/Chest: Effort normal and breath sounds normal. No stridor. No respiratory distress. She has no wheezes. She has no rales. She exhibits no tenderness.  Abdominal: Soft. Bowel sounds are normal. She exhibits no distension. There is no tenderness. There is no rebound and no guarding.  Musculoskeletal: Normal range of motion. She exhibits chronic left lower extremity edema and no tenderness.  Neurological: She is alert and oriented to person, place, and time. Coordination normal.  Skin: Skin is warm and dry. No rash noted. She is not diaphoretic. No erythema. No pallor.  Psychiatric: She has a normal mood and affect. Her behavior is normal.  Judgment and thought content normal.     EKG: Sinus  Rhythm  -  Nonspecific T-abnormality.   ABNORMAL    ASSESSMENT AND PLAN

## 2012-08-10 NOTE — Assessment & Plan Note (Signed)
Blood pressure is now reasonably controlled on current medications.

## 2012-08-21 ENCOUNTER — Encounter: Payer: Self-pay | Admitting: Pulmonary Disease

## 2012-09-17 NOTE — Telephone Encounter (Signed)
Can you make sure this pt has follow up visit scheduled after recent hospitalization?

## 2012-09-20 NOTE — Telephone Encounter (Signed)
Called and spoke with patient, appointment has been scheduled.

## 2012-09-21 ENCOUNTER — Encounter: Payer: Self-pay | Admitting: Pulmonary Disease

## 2012-09-27 ENCOUNTER — Encounter: Payer: Self-pay | Admitting: Internal Medicine

## 2012-09-27 ENCOUNTER — Ambulatory Visit (INDEPENDENT_AMBULATORY_CARE_PROVIDER_SITE_OTHER): Payer: Medicare Other | Admitting: Internal Medicine

## 2012-09-27 VITALS — BP 158/98 | HR 68 | Temp 98.2°F | Wt 220.0 lb

## 2012-09-27 DIAGNOSIS — J0100 Acute maxillary sinusitis, unspecified: Secondary | ICD-10-CM

## 2012-09-27 DIAGNOSIS — E039 Hypothyroidism, unspecified: Secondary | ICD-10-CM

## 2012-09-27 DIAGNOSIS — R5381 Other malaise: Secondary | ICD-10-CM

## 2012-09-27 DIAGNOSIS — R5383 Other fatigue: Secondary | ICD-10-CM | POA: Insufficient documentation

## 2012-09-27 DIAGNOSIS — I10 Essential (primary) hypertension: Secondary | ICD-10-CM

## 2012-09-27 DIAGNOSIS — R229 Localized swelling, mass and lump, unspecified: Secondary | ICD-10-CM | POA: Insufficient documentation

## 2012-09-27 DIAGNOSIS — E119 Type 2 diabetes mellitus without complications: Secondary | ICD-10-CM

## 2012-09-27 LAB — POCT URINALYSIS DIPSTICK
Bilirubin, UA: NEGATIVE
Glucose, UA: NEGATIVE
Ketones, UA: NEGATIVE
Leukocytes, UA: NEGATIVE
Nitrite, UA: NEGATIVE
Protein, UA: NEGATIVE
Spec Grav, UA: 1.02
Urobilinogen, UA: 0.2
pH, UA: 7

## 2012-09-27 MED ORDER — MAGNESIUM OXIDE 400 MG PO TABS: 400.0000 mg | ORAL_TABLET | Freq: Two times a day (BID) | ORAL | Status: DC

## 2012-09-27 MED ORDER — AMLODIPINE BESYLATE 5 MG PO TABS: 5.0000 mg | ORAL_TABLET | Freq: Every day | ORAL | Status: DC

## 2012-09-27 MED ORDER — LEVOFLOXACIN 500 MG PO TABS: 500.0000 mg | ORAL_TABLET | Freq: Every day | ORAL | Status: DC

## 2012-09-27 NOTE — Assessment & Plan Note (Signed)
Symptoms and exam are consistent with acute maxillary sinusitis. Will treat with Levaquin. Patient will call if symptoms are not improving over the next 48-72 hours.

## 2012-09-27 NOTE — Assessment & Plan Note (Signed)
Patient with fluctuant area right mid upper back at site of previous surgical resection of sebaceous cyst. No induration or other evidence of infection at this time. However, it appears that fluid has reaccumulated. Will set up followup with her surgeon for drainage.

## 2012-09-27 NOTE — Assessment & Plan Note (Signed)
Fatigue noted. Will recheck TSH with labs.

## 2012-09-27 NOTE — Assessment & Plan Note (Signed)
BP Readings from Last 3 Encounters:  09/27/12 158/98  08/10/12 150/60  07/27/12 130/78   BP generally well controlled on current medications. HR improved off Carvedilol. Will continue current meds.

## 2012-09-27 NOTE — Assessment & Plan Note (Signed)
Lab Results  Component Value Date   HGBA1C 6.4 07/27/2012   BG generally well controlled. Will continue metformin. Repeat A1c in 10/2012.

## 2012-09-27 NOTE — Progress Notes (Signed)
Subjective:    Patient ID: Debra Saunders, female    DOB: [birth year], 67 y.o.   MRN: [id number]  HPI 67 year old female with history of pulmonary hypertension, congestive heart failure, diabetes, hypertension presents for followup. Over the last few weeks, she has noted increased sinus congestion, purulent nasal drainage, bilateral ear pain, maxillary sinus pain, fever and chills and fatigue. She denies any change in cough or shortness of breath. She continues on supplemental oxygen. She has not been taking any medication for this.  In regards to diabetes, she reports blood sugars are well controlled, typically near 100 fasting. She is compliant with metformin.   She is also concerned today about cyst on her right upper back. She reports that in the past, surgeon resected a cystic area at the same spot. Over the last few months, she has noted reaccumulation of fluid in this area. The area is not painful.  Outpatient Encounter Prescriptions as of 09/27/2012  Medication Sig Dispense Refill  . albuterol (PROVENTIL HFA;VENTOLIN HFA) 108 (90 BASE) MCG/ACT inhaler Inhale 2 puffs into the lungs as needed.      [name] amLODipine (NORVASC) 5 MG tablet Take 1 tablet (5 mg total) by mouth daily.  30 tablet  6  . aspirin 81 MG tablet Take 81 mg by mouth daily.      . chlorthalidone (HYGROTON) 25 MG tablet Take 1 tablet (25 mg total) by mouth daily.  30 tablet  6  . enalapril (VASOTEC) 20 MG tablet Take 1 tablet (20 mg total) by mouth 2 (two) times daily.  60 tablet  6  . magnesium oxide (MAG-OX 400) 400 MG tablet Take 1 tablet (400 mg total) by mouth 2 (two) times daily.  60 tablet  3  . meloxicam (MOBIC) 7.5 MG tablet Take 7.5 mg by mouth as needed.       . metFORMIN (GLUMETZA) 500 MG (MOD) 24 hr tablet Take 1 tablet (500 mg total) by mouth 2 (two) times daily with a meal.  60 tablet  3  . NON FORMULARY Oxygen 2 liters.      . NON FORMULARY CPAP      . spironolactone (ALDACTONE) 25 MG tablet Take 1 tablet (25 mg  total) by mouth daily.  30 tablet  6  . Aclidinium Bromide (TUDORZA PRESSAIR) 400 MCG/ACT AEPB Inhale into the lungs as needed.      . beclomethasone (QVAR) 80 MCG/ACT inhaler Inhale 1 puff into the lungs as needed.      [name] levofloxacin (LEVAQUIN) 500 MG tablet Take 1 tablet (500 mg total) by mouth daily.  7 tablet  0  . simvastatin (ZOCOR) 20 MG tablet Take 20 mg by mouth daily.       No facility-administered encounter medications on file as of 09/27/2012.   BP 158/98  Pulse 68  Temp(Src) 98.2 F (36.8 C) (Oral)  Wt 220 lb (99.791 kg)  BMI 40.23 kg/m2  SpO2 98%  Review of Systems  Constitutional: Positive for fever, chills and fatigue. Negative for appetite change and unexpected weight change.  HENT: Positive for ear pain, congestion, postnasal drip and sinus pressure. Negative for sore throat, trouble swallowing, neck pain and voice change.   Eyes: Negative for visual disturbance.  Respiratory: Positive for shortness of breath. Negative for cough, wheezing and stridor.   Cardiovascular: Negative for chest pain, palpitations and leg swelling.  Gastrointestinal: Negative for nausea, vomiting, abdominal pain, diarrhea, constipation, blood in stool, abdominal distention and anal bleeding.  Genitourinary: Negative for dysuria  and flank pain.  Musculoskeletal: Negative for myalgias, arthralgias and gait problem.  Skin: Negative for color change and rash.  Neurological: Negative for dizziness and headaches.  Hematological: Negative for adenopathy. Does not bruise/bleed easily.  Psychiatric/Behavioral: Negative for suicidal ideas, sleep disturbance and dysphoric mood. The patient is not nervous/anxious.        Objective:   Physical Exam  Constitutional: She is oriented to person, place, and time. She appears well-developed and well-nourished. No distress.  HENT:  Head: Normocephalic and atraumatic.  Right Ear: External ear normal. Tympanic membrane is bulging. A middle ear effusion is  present.  Left Ear: External ear normal. Tympanic membrane is bulging. A middle ear effusion is present.  Nose: Mucosal edema present. Right sinus exhibits maxillary sinus tenderness. Left sinus exhibits maxillary sinus tenderness.  Mouth/Throat: Oropharynx is clear and moist. No oropharyngeal exudate.  Eyes: Conjunctivae are normal. Pupils are equal, round, and reactive to light. Right eye exhibits no discharge. Left eye exhibits no discharge. No scleral icterus.  Neck: Normal range of motion. Neck supple. No tracheal deviation present. No thyromegaly present.  Cardiovascular: Normal rate, regular rhythm, normal heart sounds and intact distal pulses.  Exam reveals no gallop and no friction rub.   No murmur heard. Pulmonary/Chest: Effort normal and breath sounds normal. No accessory muscle usage. Not tachypneic. No respiratory distress. She has no decreased breath sounds. She has no wheezes. She has no rales. She exhibits no tenderness.  Musculoskeletal: Normal range of motion. She exhibits no edema and no tenderness.  Lymphadenopathy:    She has no cervical adenopathy.  Neurological: She is alert and oriented to person, place, and time. No cranial nerve deficit. She exhibits normal muscle tone. Coordination normal.  Skin: Skin is warm and dry. No rash noted. She is not diaphoretic. No erythema. No pallor.     Psychiatric: She has a normal mood and affect. Her behavior is normal. Judgment and thought content normal.          Assessment & Plan:

## 2012-09-27 NOTE — Assessment & Plan Note (Signed)
Symptoms of fatigue likely multifactorial with ongoing CHF, pulmonary hypertension, and current sinusitis. Will treat sinusitis as above. Will check CMP, CBC, TSH with labs. Followup in 3-4 weeks.

## 2012-09-28 LAB — CBC WITH DIFFERENTIAL/PLATELET
Basophils Absolute: 0 10*3/uL (ref 0.0–0.1)
Basophils Relative: 0.3 % (ref 0.0–3.0)
Eosinophils Absolute: 0.2 10*3/uL (ref 0.0–0.7)
Eosinophils Relative: 2.3 % (ref 0.0–5.0)
HCT: 37.2 % (ref 36.0–46.0)
Hemoglobin: 11.9 g/dL — ABNORMAL LOW (ref 12.0–15.0)
Lymphocytes Relative: 22.5 % (ref 12.0–46.0)
Lymphs Abs: 2.1 10*3/uL (ref 0.7–4.0)
MCHC: 32.1 g/dL (ref 30.0–36.0)
MCV: 82.9 fl (ref 78.0–100.0)
Monocytes Absolute: 0.3 10*3/uL (ref 0.1–1.0)
Monocytes Relative: 3.7 % (ref 3.0–12.0)
Neutro Abs: 6.7 10*3/uL (ref 1.4–7.7)
Neutrophils Relative %: 71.2 % (ref 43.0–77.0)
Platelets: 315 10*3/uL (ref 150.0–400.0)
RBC: 4.48 Mil/uL (ref 3.87–5.11)
RDW: 14.1 % (ref 11.5–14.6)
WBC: 9.4 10*3/uL (ref 4.5–10.5)

## 2012-09-28 LAB — COMPREHENSIVE METABOLIC PANEL
ALT: 38 U/L — ABNORMAL HIGH (ref 0–35)
AST: 41 U/L — ABNORMAL HIGH (ref 0–37)
Albumin: 3.6 g/dL (ref 3.5–5.2)
Alkaline Phosphatase: 104 U/L (ref 39–117)
BUN: 12 mg/dL (ref 6–23)
CO2: 29 mEq/L (ref 19–32)
Calcium: 9.1 mg/dL (ref 8.4–10.5)
Chloride: 95 mEq/L — ABNORMAL LOW (ref 96–112)
Creatinine, Ser: 0.6 mg/dL (ref 0.4–1.2)
GFR: 120.41 mL/min (ref >60.00)
Glucose, Bld: 109 mg/dL — ABNORMAL HIGH (ref 70–99)
Potassium: 3 mEq/L — ABNORMAL LOW (ref 3.5–5.1)
Sodium: 135 mEq/L (ref 135–145)
Total Bilirubin: 0.5 mg/dL (ref 0.3–1.2)
Total Protein: 8.2 g/dL (ref 6.0–8.3)

## 2012-09-28 LAB — TSH: TSH: 0.64 u[IU]/mL (ref 0.35–5.50)

## 2012-09-28 LAB — URINE CULTURE
Colony Count: NO GROWTH
Organism ID, Bacteria: NO GROWTH

## 2012-10-01 ENCOUNTER — Other Ambulatory Visit (INDEPENDENT_AMBULATORY_CARE_PROVIDER_SITE_OTHER): Payer: Medicare Other

## 2012-10-01 ENCOUNTER — Other Ambulatory Visit: Payer: Self-pay | Admitting: *Deleted

## 2012-10-01 DIAGNOSIS — N390 Urinary tract infection, site not specified: Secondary | ICD-10-CM

## 2012-10-01 LAB — POCT URINALYSIS DIPSTICK
Bilirubin, UA: NEGATIVE
Blood, UA: NEGATIVE
Glucose, UA: NEGATIVE
Ketones, UA: NEGATIVE
Leukocytes, UA: NEGATIVE
Nitrite, UA: NEGATIVE
Protein, UA: NEGATIVE
Spec Grav, UA: 1.02
Urobilinogen, UA: 0.2
pH, UA: 7.5

## 2012-10-04 ENCOUNTER — Emergency Department: Payer: Self-pay | Admitting: Emergency Medicine

## 2012-10-04 LAB — CBC
HCT: 35.9 % (ref 35.0–47.0)
HGB: 11.3 g/dL — ABNORMAL LOW (ref 12.0–16.0)
MCH: 26.5 pg (ref 26.0–34.0)
MCHC: 31.3 g/dL — ABNORMAL LOW (ref 32.0–36.0)
MCV: 85 fL (ref 80–100)
Platelet: 276 10*3/uL (ref 150–440)
RBC: 4.25 10*6/uL (ref 3.80–5.20)
RDW: 14.4 % (ref 11.5–14.5)
WBC: 8 10*3/uL (ref 3.6–11.0)

## 2012-10-04 LAB — COMPREHENSIVE METABOLIC PANEL
Albumin: 3.3 g/dL — ABNORMAL LOW (ref 3.4–5.0)
Alkaline Phosphatase: 118 U/L (ref 50–136)
Anion Gap: 5 — ABNORMAL LOW (ref 7–16)
BUN: 19 mg/dL — ABNORMAL HIGH (ref 7–18)
Bilirubin,Total: 0.3 mg/dL (ref 0.2–1.0)
Calcium, Total: 8.8 mg/dL (ref 8.5–10.1)
Chloride: 101 mmol/L (ref 98–107)
Co2: 31 mmol/L (ref 21–32)
Creatinine: 0.7 mg/dL (ref 0.60–1.30)
EGFR (African American): >60
EGFR (Non-African Amer.): >60
Glucose: 103 mg/dL — ABNORMAL HIGH (ref 65–99)
Osmolality: 276 (ref 275–301)
Potassium: 3.4 mmol/L — ABNORMAL LOW (ref 3.5–5.1)
SGOT(AST): 37 U/L (ref 15–37)
SGPT (ALT): 38 U/L (ref 12–78)
Sodium: 137 mmol/L (ref 136–145)
Total Protein: 8.1 g/dL (ref 6.4–8.2)

## 2012-10-06 ENCOUNTER — Encounter: Payer: Self-pay | Admitting: Adult Health

## 2012-10-06 ENCOUNTER — Ambulatory Visit (INDEPENDENT_AMBULATORY_CARE_PROVIDER_SITE_OTHER)
Admission: RE | Admit: 2012-10-06 | Discharge: 2012-10-06 | Disposition: A | Discharge status: Home / Self Care | Payer: Medicare Other | Source: Ambulatory Visit | Attending: Adult Health | Admitting: Adult Health

## 2012-10-06 ENCOUNTER — Ambulatory Visit (INDEPENDENT_AMBULATORY_CARE_PROVIDER_SITE_OTHER): Payer: Medicare Other | Admitting: Adult Health

## 2012-10-06 VITALS — BP 132/68 | HR 58 | Temp 98.1°F | Resp 14 | Wt 222.5 lb

## 2012-10-06 DIAGNOSIS — M25562 Pain in left knee: Secondary | ICD-10-CM | POA: Insufficient documentation

## 2012-10-06 DIAGNOSIS — M25569 Pain in unspecified knee: Secondary | ICD-10-CM

## 2012-10-06 DIAGNOSIS — R609 Edema, unspecified: Secondary | ICD-10-CM

## 2012-10-06 DIAGNOSIS — R600 Localized edema: Secondary | ICD-10-CM

## 2012-10-06 MED ORDER — HYDROCODONE-ACETAMINOPHEN 7.5-325 MG PO TABS
1.0000 | ORAL_TABLET | Freq: Four times a day (QID) | ORAL | Status: DC | PRN
Start: 2012-10-06 — End: 2012-10-26

## 2012-10-06 NOTE — Assessment & Plan Note (Signed)
Increased edema of the left lower extremity possibly secondary to not using her pumps for lymphedema. She is going to call the physician who manages her lymphedema to inquire whether she needs to increase the amount of time for pumping. She reports that currently it was just for one hour daily. Doppler negative for DVT done in the emergency room at [facility]

## 2012-10-06 NOTE — Addendum Note (Signed)
Addended by: Wynonia Lawman E on: 10/06/2012 10:47 AM   Modules accepted: Orders

## 2012-10-06 NOTE — Assessment & Plan Note (Signed)
Suspect this may be related to osteoarthritis. X-ray of the left knee ordered. Norco one tablet every 6 hours as needed for pain. Patient is currently on Mobic when necessary. She will take this for the next few days to see if that helps.

## 2012-10-06 NOTE — Patient Instructions (Addendum)
  Please go to the [facility] office for your xray of the left knee.  Once the results are available I will contact you.  Call your doctor that manages your lymphedema pump and ask if you need to increase the amount of time you use the pump.  I have prescribed Norco for pain. You can take 1 tablet as needed for pain.  Take your Mobic to see if this helps with your knee pain.  If your symptoms are improved, you may return to pulmonary rehab on Monday.  Elevate leg while sitting or lying. Try to have your leg elevated on pillow.  You may apply heat and/or ice (whichever makes it feel better) to your knee for 15 - 20 minutes at a time. You can do this 3-4 times a day as needed.

## 2012-10-06 NOTE — Progress Notes (Signed)
Subjective:    Patient ID: Debra Saunders, female    DOB: 11-25-1945, 67 y.o.   MRN: [id number]  HPI  Patient is a pleasant 67 year old female with a history of diabetes, hypertension, hyperlipidemia, hypothyroidism, pulmonary hypertension, lymphedema who presents to clinic with left lower extremity edema. Her symptoms began on Friday and she reports having left calf discomfort. Symptoms persisted throughout weekend. She went to pulmonary rehabilitation on Monday and reported the discomfort in her left calf. They sent her to the emergency room at [facility] where she was evaluated for a DVT. Patient was negative for a DVT. She reports her edema is worse. She normally uses condoms for her lymphedema, however, she has not been using this. She is having knee pain. She denies chest pain, shortness of breath or any other symptoms. She is anxious to return back to her pulmonary rehabilitation.  Current Outpatient Prescriptions on File Prior to Visit  Medication Sig Dispense Refill  . albuterol (PROVENTIL HFA;VENTOLIN HFA) 108 (90 BASE) MCG/ACT inhaler Inhale 2 puffs into the lungs as needed.      [name] amLODipine (NORVASC) 5 MG tablet Take 1 tablet (5 mg total) by mouth daily.  30 tablet  6  . aspirin 81 MG tablet Take 81 mg by mouth daily.      . beclomethasone (QVAR) 80 MCG/ACT inhaler Inhale 1 puff into the lungs as needed.      . chlorthalidone (HYGROTON) 25 MG tablet Take 1 tablet (25 mg total) by mouth daily.  30 tablet  6  . enalapril (VASOTEC) 20 MG tablet Take 1 tablet (20 mg total) by mouth 2 (two) times daily.  60 tablet  6  . magnesium oxide (MAG-OX 400) 400 MG tablet Take 1 tablet (400 mg total) by mouth 2 (two) times daily.  60 tablet  3  . meloxicam (MOBIC) 7.5 MG tablet Take 7.5 mg by mouth as needed.       . metFORMIN (GLUMETZA) 500 MG (MOD) 24 hr tablet Take 1 tablet (500 mg total) by mouth 2 (two) times daily with a meal.  60 tablet  3  . NON FORMULARY Oxygen 2 liters.      . NON FORMULARY CPAP       . simvastatin (ZOCOR) 20 MG tablet Take 20 mg by mouth daily.       No current facility-administered medications on file prior to visit.     Review of Systems  Respiratory: Negative.  Negative for chest tightness and shortness of breath.   Cardiovascular: Positive for leg swelling. Negative for chest pain.  Skin: Negative for pallor, rash and wound.  Neurological: Negative for numbness.     BP 132/68  Pulse 58  Temp(Src) 98.1 F (36.7 C) (Oral)  Resp 14  Wt 222 lb 8 oz (100.925 kg)  BMI 40.69 kg/m2  SpO2 97%     Objective:   Physical Exam  Constitutional: She is oriented to person, place, and time.  Overweight, well groomed female in no apparent distress  Cardiovascular: Normal rate, regular rhythm and intact distal pulses.   Pulmonary/Chest: Effort normal.  Musculoskeletal: She exhibits edema and tenderness.  Significant left leg edema as compared to the right. Edema is greater above-the-knee. She has knee tenderness   Neurological: She is alert and oriented to person, place, and time. Coordination normal.  Skin: Skin is warm and dry. No rash noted. No erythema. No pallor.  Psychiatric: She has a normal mood and affect. Her behavior is normal. Judgment and thought  content normal.          Assessment & Plan:

## 2012-10-11 ENCOUNTER — Other Ambulatory Visit: Payer: Medicare Other

## 2012-10-14 ENCOUNTER — Ambulatory Visit (INDEPENDENT_AMBULATORY_CARE_PROVIDER_SITE_OTHER): Payer: Medicare Other | Admitting: General Surgery

## 2012-10-14 ENCOUNTER — Encounter: Payer: Self-pay | Admitting: General Surgery

## 2012-10-14 VITALS — BP 200/90 | HR 82 | Resp 18 | Ht 62.0 in | Wt 223.0 lb

## 2012-10-14 DIAGNOSIS — D216 Benign neoplasm of connective and other soft tissue of trunk, unspecified: Secondary | ICD-10-CM

## 2012-10-14 DIAGNOSIS — D367 Benign neoplasm of other specified sites: Secondary | ICD-10-CM

## 2012-10-14 NOTE — Patient Instructions (Addendum)
Remove mass at hospital outpatient surgery, may have a drain after surgery. Discussed fully with pt. She is on Oxygen now for both COPD and CHF. Dr Fletcher Anon from cardiology to  to clear for surgery

## 2012-10-14 NOTE — Progress Notes (Signed)
Patient ID: Debra Saunders, female   DOB: [birth year], 67 y.o.   MRN: [id number]  Chief Complaint  Patient presents with  . Lipoma    on her back    HPI Debra Saunders is a 67 y.o. female here today for a lipoma on her back. States she had the area removed 2002 ? And it seems to have come back.  It has gotten larger over past 3 months.  HPI  Past Medical History  Diagnosis Date  . Thyroid disorder   . Diabetes mellitus     type II  . Thyroid nodule   . Sleep apnea   . Obesity   . Hypertension   . Hyperlipidemia   . Urinary incontinence   . CHF (congestive heart failure) 2005    Dx at [facility]  . Chronic back pain   . COPD (chronic obstructive pulmonary disease)     2L [place]  . Sleep apnea     Past Surgical History  Procedure Laterality Date  . Cholecystectomy    . Vaginal hysterectomy    . Cardiac catheterization  2013    @ [place]: No obstructive CAD: Only 20% ostial left circumflex.  . Abdominal hysterectomy  1969  . Cyst removal trunk  2002    BACK    Family History  Problem Relation Age of Onset  . Heart disease Mother   . Hypertension Mother   . COPD Mother     was a smoker  . Thyroid disease Mother   . Hypertension Father   . Hypertension Sister   . Thyroid disease Sister   . Hypertension Sister   . Thyroid disease Sister   . Breast cancer Maternal Aunt     Social History History  Substance Use Topics  . Smoking status: Former Smoker -- 0.30 packs/day for 2 years    Types: Cigarettes    Quit date: 06/23/1990  . Smokeless tobacco: Not on file  . Alcohol Use: No    Allergies  Allergen Reactions  . Penicillins     Current Outpatient Prescriptions  Medication Sig Dispense Refill  . albuterol (PROVENTIL HFA;VENTOLIN HFA) 108 (90 BASE) MCG/ACT inhaler Inhale 2 puffs into the lungs as needed.      [name] amLODipine (NORVASC) 5 MG tablet Take 1 tablet (5 mg total) by mouth daily.  30 tablet  6  . aspirin 81 MG tablet Take 81 mg by mouth daily.      .  chlorthalidone (HYGROTON) 25 MG tablet Take 1 tablet (25 mg total) by mouth daily.  30 tablet  6  . enalapril (VASOTEC) 20 MG tablet Take 1 tablet (20 mg total) by mouth 2 (two) times daily.  60 tablet  6  . HYDROcodone-acetaminophen (NORCO) 7.5-325 MG per tablet Take 1 tablet by mouth every 6 (six) hours as needed for pain.  30 tablet  0  . levofloxacin (LEVAQUIN) 500 MG tablet Take 500 mg by mouth daily.      . magnesium oxide (MAG-OX 400) 400 MG tablet Take 1 tablet (400 mg total) by mouth 2 (two) times daily.  60 tablet  3  . meloxicam (MOBIC) 7.5 MG tablet Take 7.5 mg by mouth as needed.       . metFORMIN (GLUMETZA) 500 MG (MOD) 24 hr tablet Take 1 tablet (500 mg total) by mouth 2 (two) times daily with a meal.  60 tablet  3  . NON FORMULARY Oxygen 2 liters.      . NON FORMULARY CPAP      .  simvastatin (ZOCOR) 20 MG tablet Take 20 mg by mouth daily.       No current facility-administered medications for this visit.    Review of Systems Review of Systems  Constitutional: Negative.   Respiratory: Positive for cough.   Cardiovascular: Negative.     Blood pressure 200/90, pulse 82, resp. rate 18, height 5\' 2"  (1.575 m), weight 223 lb (101.152 kg).  Physical Exam Physical Exam  Constitutional: She is oriented to person, place, and time. She appears well-developed and well-nourished.  Cardiovascular: Normal rate and regular rhythm.   Pulmonary/Chest: Effort normal and breath sounds normal.  Lymphadenopathy:    She has no cervical adenopathy.    She has no axillary adenopathy.  Neurological: She is alert and oriented to person, place, and time.  Skin: Skin is warm and dry.  Posterior right back inframedial from scapula  10 x 10 cm soft mass   At medial end of this mass there is an old vertical incision.  Data Reviewed nil  Assessment    Large soft tissue mass on the back, either a recurrent lipoma or a new mass.     Plan    Given large size and rapid increase in size  excision recommended.        SANKAR,SEEPLAPUTHUR G 10/15/2012, 6:25 AM

## 2012-10-15 ENCOUNTER — Encounter: Payer: Self-pay | Admitting: General Surgery

## 2012-10-15 ENCOUNTER — Other Ambulatory Visit: Payer: Self-pay | Admitting: *Deleted

## 2012-10-15 ENCOUNTER — Telehealth: Payer: Self-pay | Admitting: *Deleted

## 2012-10-15 DIAGNOSIS — D216 Benign neoplasm of connective and other soft tissue of trunk, unspecified: Secondary | ICD-10-CM

## 2012-10-15 DIAGNOSIS — D367 Benign neoplasm of other specified sites: Secondary | ICD-10-CM | POA: Insufficient documentation

## 2012-10-15 NOTE — Telephone Encounter (Signed)
Patient has been scheduled for an appointment with Dr. Fletcher Anon on 10-19-12 at 3:45 pm. She is aware of date, time, and instructions.

## 2012-10-15 NOTE — Progress Notes (Signed)
Patient to see Dr. Fletcher Anon for cardiac clearance. This patient needs to have excision of soft tissue mass on back done at [facility].

## 2012-10-19 ENCOUNTER — Ambulatory Visit (INDEPENDENT_AMBULATORY_CARE_PROVIDER_SITE_OTHER): Payer: Medicare Other | Admitting: Cardiovascular Disease

## 2012-10-19 ENCOUNTER — Encounter: Payer: Self-pay | Admitting: Pulmonary Disease

## 2012-10-19 ENCOUNTER — Encounter: Payer: Self-pay | Admitting: Cardiovascular Disease

## 2012-10-19 VITALS — BP 142/90 | HR 63 | Ht 62.0 in | Wt 224.2 lb

## 2012-10-19 DIAGNOSIS — I10 Essential (primary) hypertension: Secondary | ICD-10-CM

## 2012-10-19 DIAGNOSIS — R079 Chest pain, unspecified: Secondary | ICD-10-CM

## 2012-10-19 DIAGNOSIS — I509 Heart failure, unspecified: Secondary | ICD-10-CM

## 2012-10-19 DIAGNOSIS — Z01810 Encounter for preprocedural cardiovascular examination: Secondary | ICD-10-CM

## 2012-10-19 NOTE — Assessment & Plan Note (Signed)
The patient has known history of chronic diastolic heart failure with normal ejection fraction and no significant valvular abnormalities. Cardiac catheterization last year showed no evidence of obstructive coronary artery disease. She does have an overall low functional capacity related to her pulmonary hypertension and lung disease. She also had previous bradycardia while on carvedilol. Heart rate currently seems to be in the normal range. Due to all of that, she is considered at that overall low to moderate risk for cardiovascular complications. Her overall risk might be a bit higher due to obesity, sleep apnea and lung disease. No further cardiac workup is needed at this time.

## 2012-10-19 NOTE — Assessment & Plan Note (Signed)
Due to diastolic heart failure. She appears to be euvolemic.

## 2012-10-19 NOTE — Patient Instructions (Addendum)
Continue same medications.  Follow up in 6 months.  

## 2012-10-19 NOTE — Progress Notes (Signed)
HPI  This is a 67 -year-old female who is here today for preoperative cardiovascular evaluation. She was found recently to have a mass in her back which is likely a lipoma and needs surgical excision to be done by Dr. Jamal Collin. She has known history of chronic diastolic heart failure with pulmonary hypertension and bradycardia while on carvedilol. She has chronic left lower extremity swelling which has been evaluated in the past with venous duplex ultrasound on multiple occasions and was negative for DVT. Her echocardiogram in 2013 showed normal LV systolic function with mild diastolic dysfunction and evidence of elevated filling pressures. There was also moderate to severe pulmonary hypertension with peak systolic pressure of 56 mm Hg. she underwent cardiac catheterization in August of 2013 which showed no evidence of obstructive coronary artery disease. She was noted to be severely hypertensive with significant elevation in left ventricular end-diastolic pressure.  She has been on home oxygen due to documented hypoxia. She follows up with Dr. Lake Bells as well.  She finished pulmonary rehabilitation. She has been doing reasonably well with no chest pain or worsening dyspnea. She complains of occasional dizziness but there has been no syncope or presyncope.  Allergies  Allergen Reactions  . Penicillins      Current Outpatient Prescriptions on File Prior to Visit  Medication Sig Dispense Refill  . albuterol (PROVENTIL HFA;VENTOLIN HFA) 108 (90 BASE) MCG/ACT inhaler Inhale 2 puffs into the lungs as needed.      [name] amLODipine (NORVASC) 5 MG tablet Take 1 tablet (5 mg total) by mouth daily.  30 tablet  6  . aspirin 81 MG tablet Take 81 mg by mouth daily.      . chlorthalidone (HYGROTON) 25 MG tablet Take 1 tablet (25 mg total) by mouth daily.  30 tablet  6  . enalapril (VASOTEC) 20 MG tablet Take 1 tablet (20 mg total) by mouth 2 (two) times daily.  60 tablet  6  . HYDROcodone-acetaminophen (NORCO)  7.5-325 MG per tablet Take 1 tablet by mouth every 6 (six) hours as needed for pain.  30 tablet  0  . magnesium oxide (MAG-OX 400) 400 MG tablet Take 1 tablet (400 mg total) by mouth 2 (two) times daily.  60 tablet  3  . meloxicam (MOBIC) 7.5 MG tablet Take 7.5 mg by mouth as needed.       . metFORMIN (GLUMETZA) 500 MG (MOD) 24 hr tablet Take 1 tablet (500 mg total) by mouth 2 (two) times daily with a meal.  60 tablet  3  . NON FORMULARY Oxygen 2 liters.      . NON FORMULARY CPAP      . simvastatin (ZOCOR) 20 MG tablet Take 20 mg by mouth daily.       No current facility-administered medications on file prior to visit.     Past Medical History  Diagnosis Date  . Thyroid disorder   . Diabetes mellitus     type II  . Thyroid nodule   . Sleep apnea   . Obesity   . Hypertension   . Hyperlipidemia   . Urinary incontinence   . CHF (congestive heart failure) 2005    Dx at [facility]  . Chronic back pain   . COPD (chronic obstructive pulmonary disease)     2L [place]  . Sleep apnea   . Lipoma of back   . Arthritis      Past Surgical History  Procedure Laterality Date  . Cholecystectomy    .  Vaginal hysterectomy    . Cardiac catheterization  2013    @ [place]: No obstructive CAD: Only 20% ostial left circumflex.  . Abdominal hysterectomy  1969  . Cyst removal trunk  2002    BACK     Family History  Problem Relation Age of Onset  . Heart disease Mother   . Hypertension Mother   . COPD Mother     was a smoker  . Thyroid disease Mother   . Hypertension Father   . Hypertension Sister   . Thyroid disease Sister   . Hypertension Sister   . Thyroid disease Sister   . Breast cancer Maternal Aunt      History   Social History  . Marital Status: Single    Spouse Name: N/A    Number of Children: N/A  . Years of Education: N/A   Occupational History  . Retired- factory work     exposed to [place]  . Smoking status: Former Smoker -- 0.30  packs/day for 2 years    Types: Cigarettes    Quit date: 06/23/1990  . Smokeless tobacco: Not on file  . Alcohol Use: No  . Drug Use: No  . Sexually Active: Not on file   Other Topics Concern  . Not on file   Social History Narrative   Lives in [place] with daughter. Has 4 children. No pets.      Work - Restaurant manager, fast food, retired      Diet - regular       PHYSICAL EXAM   BP 142/90  Pulse 63  Ht 5\' 2"  (1.575 m)  Wt 224 lb 4 oz (101.719 kg)  BMI 41.01 kg/m2 Constitutional: She is oriented to person, place, and time. She appears well-developed and well-nourished. No distress.  HENT: No nasal discharge.  Head: Normocephalic and atraumatic.  Eyes: Pupils are equal and round. Right eye exhibits no discharge. Left eye exhibits no discharge.  Neck: Normal range of motion. Neck supple. No JVD present. No thyromegaly present.  Cardiovascular: Normal rate, regular rhythm, normal heart sounds. Exam reveals no gallop and no friction rub. No murmur heard.  Pulmonary/Chest: Effort normal and breath sounds normal. No stridor. No respiratory distress. She has no wheezes. She has no rales. She exhibits no tenderness.  Abdominal: Soft. Bowel sounds are normal. She exhibits no distension. There is no tenderness. There is no rebound and no guarding.  Musculoskeletal: Normal range of motion. She exhibits chronic left lower extremity edema and no tenderness.  Neurological: She is alert and oriented to person, place, and time. Coordination normal.  Skin: Skin is warm and dry. No rash noted. She is not diaphoretic. No erythema. No pallor.  Psychiatric: She has a normal mood and affect. Her behavior is normal. Judgment and thought content normal.     EKG: Normal sinus rhythm with sinus arrhythmia, left ventricular hypertrophy and nonspecific T wave changes.   ASSESSMENT AND PLAN

## 2012-10-19 NOTE — Assessment & Plan Note (Signed)
Blood pressure is reasonably controlled today. She is currently on chlorthalidone, amlodipine and enalapril. She used to be on spironolactone in the past. It's not clear why this medication was stopped or whether she just ran out of it. If her blood pressure starts going up again, I recommend resuming this medication.

## 2012-10-20 ENCOUNTER — Telehealth: Payer: Self-pay | Admitting: *Deleted

## 2012-10-20 NOTE — Telephone Encounter (Signed)
Patient called the office back to arrange for surgery. This has been scheduled at [facility] for 11-04-12.

## 2012-10-20 NOTE — Telephone Encounter (Signed)
Patient has been left a message to call the office. We need to get her surgery scheduled.

## 2012-10-22 ENCOUNTER — Other Ambulatory Visit: Payer: Self-pay | Admitting: *Deleted

## 2012-10-22 DIAGNOSIS — E119 Type 2 diabetes mellitus without complications: Secondary | ICD-10-CM

## 2012-10-22 MED ORDER — METFORMIN HCL ER (MOD) 500 MG PO TB24: 500.0000 mg | ORAL_TABLET | Freq: Two times a day (BID) | ORAL | Status: DC

## 2012-10-22 MED ORDER — CHLORTHALIDONE 25 MG PO TABS: 25.0000 mg | ORAL_TABLET | Freq: Every day | ORAL | Status: DC

## 2012-10-26 ENCOUNTER — Ambulatory Visit (INDEPENDENT_AMBULATORY_CARE_PROVIDER_SITE_OTHER): Payer: Medicare Other | Admitting: Internal Medicine

## 2012-10-26 ENCOUNTER — Encounter: Payer: Self-pay | Admitting: Internal Medicine

## 2012-10-26 VITALS — BP 160/100 | HR 53 | Temp 98.1°F | Wt 223.0 lb

## 2012-10-26 DIAGNOSIS — E119 Type 2 diabetes mellitus without complications: Secondary | ICD-10-CM

## 2012-10-26 DIAGNOSIS — M25569 Pain in unspecified knee: Secondary | ICD-10-CM

## 2012-10-26 DIAGNOSIS — M25562 Pain in left knee: Secondary | ICD-10-CM

## 2012-10-26 DIAGNOSIS — I10 Essential (primary) hypertension: Secondary | ICD-10-CM

## 2012-10-26 LAB — HEMOGLOBIN A1C: Hgb A1c MFr Bld: 6.8 % — ABNORMAL HIGH (ref 4.6–6.5)

## 2012-10-26 LAB — COMPREHENSIVE METABOLIC PANEL
ALT: 38 U/L — ABNORMAL HIGH (ref 0–35)
AST: 35 U/L (ref 0–37)
Albumin: 3.6 g/dL (ref 3.5–5.2)
Alkaline Phosphatase: 101 U/L (ref 39–117)
BUN: 10 mg/dL (ref 6–23)
CO2: 27 mEq/L (ref 19–32)
Calcium: 8.7 mg/dL (ref 8.4–10.5)
Chloride: 100 mEq/L (ref 96–112)
Creatinine, Ser: 0.6 mg/dL (ref 0.4–1.2)
GFR: 124.95 mL/min (ref >60.00)
Glucose, Bld: 147 mg/dL — ABNORMAL HIGH (ref 70–99)
Potassium: 3.3 mEq/L — ABNORMAL LOW (ref 3.5–5.1)
Sodium: 135 mEq/L (ref 135–145)
Total Bilirubin: 0.4 mg/dL (ref 0.3–1.2)
Total Protein: 7.7 g/dL (ref 6.0–8.3)

## 2012-10-26 NOTE — Assessment & Plan Note (Signed)
Per, pt some variability in blood sugars at home. Will check A1c with labs today. Continue current medications.

## 2012-10-26 NOTE — Progress Notes (Signed)
Subjective:    Patient ID: Debra Saunders, female    DOB: 1946/04/20, 67 y.o.   MRN: [id number]  HPI 67YO female with h/o hypertension, diabetes, osteoarthritis presents for follow up.  DM - BG variable, one as high as 300, but typically near 150 fasting. Compliant with medications.  HTN - BP typically 140/60s at home. Denies chest pain, palpitations, headache. Compliant with medications.  OA - severe left knee pain, aching, which limits ambulation. Recent xray showed OA. Pt was started on Meloxicam and Hydrocodone with no improvement. Unable to continue hydrocodone because of sedation. Takes tramadol with minimal improvement.  Outpatient Encounter Prescriptions as of 10/26/2012  Medication Sig Dispense Refill  . amLODipine (NORVASC) 5 MG tablet Take 1 tablet (5 mg total) by mouth daily.  30 tablet  6  . aspirin 81 MG tablet Take 81 mg by mouth daily.      . chlorthalidone (HYGROTON) 25 MG tablet Take 1 tablet (25 mg total) by mouth daily.  30 tablet  3  . enalapril (VASOTEC) 20 MG tablet Take 1 tablet (20 mg total) by mouth 2 (two) times daily.  60 tablet  6  . magnesium oxide (MAG-OX 400) 400 MG tablet Take 1 tablet (400 mg total) by mouth 2 (two) times daily.  60 tablet  3  . meloxicam (MOBIC) 7.5 MG tablet Take 7.5 mg by mouth as needed.       . metFORMIN (GLUMETZA) 500 MG (MOD) 24 hr tablet Take 1 tablet (500 mg total) by mouth 2 (two) times daily with a meal.  60 tablet  3  . NON FORMULARY Oxygen 2 liters.      . NON FORMULARY CPAP      . albuterol (PROVENTIL HFA;VENTOLIN HFA) 108 (90 BASE) MCG/ACT inhaler Inhale 2 puffs into the lungs as needed.       No facility-administered encounter medications on file as of 10/26/2012.   BP 160/100  Pulse 53  Temp(Src) 98.1 F (36.7 C) (Oral)  Wt 223 lb (101.152 kg)  BMI 40.78 kg/m2  SpO2 97%  Review of Systems  Constitutional: Negative for fever, chills, appetite change, fatigue and unexpected weight change.  HENT: Negative for ear pain,  congestion, sore throat, trouble swallowing, neck pain, voice change and sinus pressure.   Eyes: Negative for visual disturbance.  Respiratory: Positive for shortness of breath (chronic). Negative for cough, wheezing and stridor.   Cardiovascular: Negative for chest pain, palpitations and leg swelling.  Gastrointestinal: Negative for nausea, vomiting, abdominal pain, diarrhea, constipation, blood in stool, abdominal distention and anal bleeding.  Genitourinary: Negative for dysuria and flank pain.  Musculoskeletal: Positive for myalgias, joint swelling and arthralgias. Negative for gait problem.  Skin: Negative for color change and rash.  Neurological: Negative for dizziness and headaches.  Hematological: Negative for adenopathy. Does not bruise/bleed easily.  Psychiatric/Behavioral: Negative for suicidal ideas, sleep disturbance and dysphoric mood. The patient is not nervous/anxious.        Objective:   Physical Exam  Constitutional: She is oriented to person, place, and time. She appears well-developed and well-nourished. No distress.  HENT:  Head: Normocephalic and atraumatic.  Right Ear: External ear normal.  Left Ear: External ear normal.  Nose: Nose normal.  Mouth/Throat: Oropharynx is clear and moist. No oropharyngeal exudate.  Eyes: Conjunctivae are normal. Pupils are equal, round, and reactive to light. Right eye exhibits no discharge. Left eye exhibits no discharge. No scleral icterus.  Neck: Normal range of motion. Neck supple. No tracheal deviation  present. No thyromegaly present.  Cardiovascular: Normal rate, regular rhythm, normal heart sounds and intact distal pulses.  Exam reveals no gallop and no friction rub.   No murmur heard. Pulmonary/Chest: Effort normal and breath sounds normal. No accessory muscle usage. Not tachypneic. No respiratory distress. She has no decreased breath sounds. She has no wheezes. She has no rhonchi. She has no rales. She exhibits no tenderness.   Musculoskeletal: She exhibits no edema and no tenderness.       Left knee: She exhibits decreased range of motion and swelling. No tenderness found.  Lymphadenopathy:    She has no cervical adenopathy.  Neurological: She is alert and oriented to person, place, and time. No cranial nerve deficit. She exhibits normal muscle tone. Coordination normal.  Skin: Skin is warm and dry. No rash noted. She is not diaphoretic. No erythema. No pallor.  Psychiatric: She has a normal mood and affect. Her behavior is normal. Judgment and thought content normal.          Assessment & Plan:

## 2012-10-26 NOTE — Assessment & Plan Note (Signed)
Persistent left knee pain which limits ambulation. Xray consistent with osteoarthritis. Question if she might benefit from Synvisc. Will set up orthopedics evaluation.

## 2012-10-26 NOTE — Assessment & Plan Note (Addendum)
BP Readings from Last 3 Encounters:  10/26/12 160/100  10/19/12 142/90  10/14/12 200/90   BP elevated today, however better controlled at home per pt report. Suspect that elevated BP in part due to severe knee pain. Will continue current medications for now. Monitor BP at home and then RTC 1 month for recheck. She will call if BP consistently >150/90.

## 2012-10-27 ENCOUNTER — Other Ambulatory Visit: Payer: Self-pay | Admitting: General Surgery

## 2012-10-27 DIAGNOSIS — R222 Localized swelling, mass and lump, trunk: Secondary | ICD-10-CM

## 2012-10-28 ENCOUNTER — Ambulatory Visit: Payer: Self-pay | Admitting: General Surgery

## 2012-11-03 ENCOUNTER — Telehealth: Payer: Self-pay | Admitting: Internal Medicine

## 2012-11-03 MED ORDER — POTASSIUM CHLORIDE CRYS ER 10 MEQ PO TBCR: 10.0000 meq | EXTENDED_RELEASE_TABLET | Freq: Two times a day (BID) | ORAL | Status: DC

## 2012-11-03 NOTE — Telephone Encounter (Signed)
Faxed lab results from 5/6 yesterday and they are requesting Potassium for patient since it was low. She is scheduled for surgery 5/15

## 2012-11-03 NOTE — Telephone Encounter (Signed)
Sherry form [facility] called to report that the patient's potassium is low and she is needing to have potassium before her surgery on tomorrow it is a 3.3 the anesthesiologist wants it at a 3.4. He wants her started on potassium today the patient is aware.

## 2012-11-03 NOTE — Telephone Encounter (Signed)
We can call in KCl 19meq daily x 2 days, then repeat BMP here on Monday.

## 2012-11-03 NOTE — Telephone Encounter (Signed)
Patient informed and verbalized understanding. Rx sent to pharmacy and lab appointment scheduled.

## 2012-11-04 ENCOUNTER — Other Ambulatory Visit: Payer: Self-pay | Admitting: Internal Medicine

## 2012-11-04 ENCOUNTER — Ambulatory Visit: Payer: Self-pay | Admitting: General Surgery

## 2012-11-04 DIAGNOSIS — D216 Benign neoplasm of connective and other soft tissue of trunk, unspecified: Secondary | ICD-10-CM

## 2012-11-05 LAB — PATHOLOGY REPORT

## 2012-11-08 ENCOUNTER — Other Ambulatory Visit: Payer: Medicare Other

## 2012-11-08 ENCOUNTER — Telehealth: Payer: Self-pay | Admitting: *Deleted

## 2012-11-08 NOTE — Telephone Encounter (Signed)
Yes, BMP for hypokalemia

## 2012-11-08 NOTE — Telephone Encounter (Signed)
Pt is coming in for labs tomorrow 05.20.2014 for labs, is it just for a BMP? If so what dx code?  Thank you

## 2012-11-09 ENCOUNTER — Ambulatory Visit (INDEPENDENT_AMBULATORY_CARE_PROVIDER_SITE_OTHER): Payer: Medicare Other | Admitting: Pulmonary Disease

## 2012-11-09 ENCOUNTER — Other Ambulatory Visit (INDEPENDENT_AMBULATORY_CARE_PROVIDER_SITE_OTHER): Payer: Medicare Other

## 2012-11-09 ENCOUNTER — Encounter: Payer: Self-pay | Admitting: Pulmonary Disease

## 2012-11-09 VITALS — BP 140/80 | HR 60 | Temp 98.7°F | Ht 62.0 in | Wt 220.0 lb

## 2012-11-09 DIAGNOSIS — G4733 Obstructive sleep apnea (adult) (pediatric): Secondary | ICD-10-CM

## 2012-11-09 DIAGNOSIS — E876 Hypokalemia: Secondary | ICD-10-CM

## 2012-11-09 DIAGNOSIS — I272 Pulmonary hypertension, unspecified: Secondary | ICD-10-CM

## 2012-11-09 DIAGNOSIS — I509 Heart failure, unspecified: Secondary | ICD-10-CM

## 2012-11-09 DIAGNOSIS — I2789 Other specified pulmonary heart diseases: Secondary | ICD-10-CM

## 2012-11-09 NOTE — Assessment & Plan Note (Signed)
She is doing well in terms of managing her edema with her current regimen managed by Dr. Fletcher Anon.  She is to continue follow up with him and his medical regimen for her.

## 2012-11-09 NOTE — Patient Instructions (Signed)
You are doing great.  Keep exercising, using your CPAP, and keeping an eye on your fluid intake  We will see you back in 07/2013 with a 6 minute walk or sooner if needed

## 2012-11-09 NOTE — Progress Notes (Signed)
Subjective:    Patient ID: Debra Saunders, female    DOB: 05/29/1946, 67 y.o.   MRN: [id number]  Synopsis: Debra Saunders is a very pleasant female with obstructive sleep apnea, obesity, diastolic dysfunction, and pulmonary hypertension who is referred to the [facility] pulmonary clinic in November 2013 for evaluation of pulmonary hypertension. In 2005 she established care with Dr. Thom Chimes at [facility] for evaluation of the same. Dr. Gaynell Face felt that her pulmonary hypertension was do to her congestive heart failure, obesity, and obstructive sleep apnea. Unfortunately she lost her insurance around 2008 and did not have regular pulmonary visits. During the interval from 2008 2013 her shortness of breath increased and she gained about 50 pounds. She had a left heart catheterization in 2013 which showed very high filling pressures of the left ventricle as well as high systemic pressures but normal coronary arteries. In November 2013 her simple spirometry was completely normal.  HPI   07/13/2012 ROV>> [name] says she thinks she is doing maybe a little better since her last visit. She has increased energy compared to before, particularly on the days when she has gone to pulmonary rehabilitation. She says that this is really making a big difference. She is using her CPAP machine on 13 cm of water after her CPAP titration study. She states that she still has intermittent shortness of breath when going out into cold weather. Also if she goes from the cold into the warm weather this will sometimes make it feel like she can't get enough air in. She also feels that when she is exposed to cigarette smoke it will make her more short of breath. In the past, she has used multiple inhalers and never received any benefit from them.  11/09/2012 ROV >> Kenley has been doing very well since the last visit and states that her breathing is doing quite well. She is very pleased with herself  after completing pulmonary rehabilitation. She feels that her shortness of breath has improved and her activity level has improved as well. She states that she has good days and bad days but in general her shortness of breath is reasonable. She still has intermittent chest tightness and states that sometimes she'll get relief with the use of a short acting bronchodilator. She tried using the Qvar but stated that it didn't really help much. She has been following her sodium intake carefully and with the use of leg pumps at home she has seen quite a bit of difference in her leg swelling. She has been using her CPAP machine every night. She continues to use and benefit from oxygen.   Past Medical History  Diagnosis Date  . Thyroid disorder   . Diabetes mellitus     type II  . Thyroid nodule   . Sleep apnea   . Obesity   . Hypertension   . Hyperlipidemia   . Urinary incontinence   . CHF (congestive heart failure) 2005    Dx at [facility]  . Chronic back pain   . COPD (chronic obstructive pulmonary disease)     2L [place]  . Sleep apnea   . Lipoma of back   . Arthritis      Review of Systems  Constitutional: Negative for fever, chills, activity change and fatigue.  HENT: Negative for congestion, rhinorrhea and postnasal drip.   Respiratory: Negative for cough, chest tightness, shortness of breath and wheezing.   Cardiovascular: Positive for leg swelling. Negative for chest pain and  palpitations.       Objective:   Physical Exam  Filed Vitals:   11/09/12 1006  BP: 140/80  Pulse: 60  Temp: 98.7 F (37.1 C)  TempSrc: Oral  Height: 5\' 2"  (1.575 m)  Weight: 220 lb (99.791 kg)  SpO2: 96%  2L [place]  Gen: chronically ill appearing but in no acute distress HEENT: NCAT, PERRL, EOMi,  PULM: CTA B CV: RRR, no mgr, no JVD AB: BS+, soft, nontender, no hsm Ext: warm, trace edema in legs, no clubbing, no cyanosis  2004 [facility]: right heart catheterization from 10/25/02 revealed a PA pressure of 62/25  (mean 36) and a mean PA pressure decreased remarkably to 20 during this catheterization. A PCWP was normal at 6 and a cardiac index of 2.42 L/m/m2 with an initial increase in vasodilator therapy to 2.71 L/m/m2 2004 6MW 313 meters (1,027 feet) On oxygen in 2004 04/22/2012 6 minute walk>> total distance walked 950 feet, heart rate ranged between 56 and 88, O2 saturation ranged between 91 and 98% on 2 L nasal cannula 11-26 Spirometry >> no obstruction, FEV1 1.42 L (70%) 11-26 6MW >> 795 Ft, O2 saturation 90% lowest on 2L 07/2012 6MW >> 1115 feet, O2 saturation 97% 2LNC     Assessment & Plan:   CHF (congestive heart failure) She is doing well in terms of managing her edema with her current regimen managed by Dr. Fletcher Anon.  She is to continue follow up with him and his medical regimen for her.  Pulmonary hypertension I was able to review her [facility] records in their entirety today using Epic.  Amazingly, her 6 minute walk in February 2014 was BETTER than her 6 minute walk in 2004.  Considering the fact that her [place] is secondary to OSA and CHF and the fact that she has been this stable for TEN YEARS, there is no indication for a pulmonary vasodilator.    Plan: -continue exercise -continue CPAP -continue oxygen -continue to follow up with Dr. Fletcher Anon -f/u with me in Feb 2015 with visit and 6MW  OSA (obstructive sleep apnea) Continue CPAP at 13cm H20    Updated Medication List Outpatient Encounter Prescriptions as of 11/09/2012  Medication Sig Dispense Refill  . albuterol (PROVENTIL HFA;VENTOLIN HFA) 108 (90 BASE) MCG/ACT inhaler Inhale 2 puffs into the lungs as needed.      [name] amLODipine (NORVASC) 5 MG tablet Take 1 tablet (5 mg total) by mouth daily.  30 tablet  6  . aspirin 81 MG tablet Take 81 mg by mouth daily.      . chlorthalidone (HYGROTON) 25 MG tablet Take 1 tablet (25 mg total) by mouth daily.  30 tablet  3  . enalapril (VASOTEC) 20 MG tablet Take 1 tablet (20 mg total) by mouth 2 (two)  times daily.  60 tablet  6  . magnesium oxide (MAG-OX 400) 400 MG tablet Take 1 tablet (400 mg total) by mouth 2 (two) times daily.  60 tablet  3  . meloxicam (MOBIC) 7.5 MG tablet Take 7.5 mg by mouth as needed.       . metFORMIN (GLUMETZA) 500 MG (MOD) 24 hr tablet Take 1 tablet (500 mg total) by mouth 2 (two) times daily with a meal.  60 tablet  3  . NON FORMULARY Oxygen 2 liters.      . NON FORMULARY CPAP      . [DISCONTINUED] potassium chloride (K-DUR,KLOR-CON) 10 MEQ tablet Take 1 tablet (10 mEq total) by mouth 2 (two) times  daily.  5 tablet  0   No facility-administered encounter medications on file as of 11/09/2012.

## 2012-11-09 NOTE — Assessment & Plan Note (Signed)
I was able to review her [facility] records in their entirety today using Epic.  Amazingly, her 6 minute walk in February 2014 was BETTER than her 6 minute walk in 2004.  Considering the fact that her [place] is secondary to OSA and CHF and the fact that she has been this stable for TEN YEARS, there is no indication for a pulmonary vasodilator.    Plan: -continue exercise -continue CPAP -continue oxygen -continue to follow up with Dr. Fletcher Anon -f/u with me in Feb 2015 with visit and 6MW

## 2012-11-09 NOTE — Assessment & Plan Note (Signed)
Continue CPAP at 13cm H20

## 2012-11-10 ENCOUNTER — Encounter: Payer: Self-pay | Admitting: General Surgery

## 2012-11-10 LAB — BASIC METABOLIC PANEL
BUN: 19 mg/dL (ref 6–23)
CO2: 30 mEq/L (ref 19–32)
Calcium: 9.2 mg/dL (ref 8.4–10.5)
Chloride: 96 mEq/L (ref 96–112)
Creatinine, Ser: 0.8 mg/dL (ref 0.4–1.2)
GFR: 86.36 mL/min (ref >60.00)
Glucose, Bld: 123 mg/dL — ABNORMAL HIGH (ref 70–99)
Potassium: 3.4 mEq/L — ABNORMAL LOW (ref 3.5–5.1)
Sodium: 136 mEq/L (ref 135–145)

## 2012-11-14 ENCOUNTER — Other Ambulatory Visit: Payer: Self-pay | Admitting: Cardiovascular Disease

## 2012-11-16 ENCOUNTER — Other Ambulatory Visit: Payer: Medicare Other

## 2012-11-17 ENCOUNTER — Encounter: Payer: Self-pay | Admitting: General Surgery

## 2012-11-17 ENCOUNTER — Ambulatory Visit (INDEPENDENT_AMBULATORY_CARE_PROVIDER_SITE_OTHER): Payer: Medicare Other | Admitting: General Surgery

## 2012-11-17 VITALS — BP 180/110 | HR 86 | Resp 16 | Ht 62.0 in | Wt 221.0 lb

## 2012-11-17 DIAGNOSIS — R229 Localized swelling, mass and lump, unspecified: Secondary | ICD-10-CM

## 2012-11-17 DIAGNOSIS — R222 Localized swelling, mass and lump, trunk: Secondary | ICD-10-CM

## 2012-11-17 NOTE — Patient Instructions (Addendum)
Patient to return in two weeks  

## 2012-11-17 NOTE — Progress Notes (Signed)
Patient ID: Debra Saunders, female   DOB: February 12, 1946, 67 y.o.   MRN: [id number]  Chief Complaint  Patient presents with  . Other    lipoma removal     HPI Debra Saunders is a 67 y.o. female here today for her follow up from an removal lipoma in back done 10/14/12.  HPI  Past Medical History  Diagnosis Date  . Thyroid disorder   . Diabetes mellitus     type II  . Thyroid nodule   . Sleep apnea   . Obesity   . Hypertension   . Hyperlipidemia   . Urinary incontinence   . CHF (congestive heart failure) 2005    Dx at [facility]  . Chronic back pain   . COPD (chronic obstructive pulmonary disease)     2L [place]  . Sleep apnea   . Lipoma of back   . Arthritis     Past Surgical History  Procedure Laterality Date  . Cholecystectomy    . Vaginal hysterectomy    . Cardiac catheterization  2013    @ [place]: No obstructive CAD: Only 20% ostial left circumflex.  . Abdominal hysterectomy  1969  . Cyst removal trunk  2002    BACK  . Lipoma removal  2014    back    Family History  Problem Relation Age of Onset  . Heart disease Mother   . Hypertension Mother   . COPD Mother     was a smoker  . Thyroid disease Mother   . Hypertension Father   . Hypertension Sister   . Thyroid disease Sister   . Hypertension Sister   . Thyroid disease Sister   . Breast cancer Maternal Aunt     Social History History  Substance Use Topics  . Smoking status: Former Smoker -- 0.30 packs/day for 2 years    Types: Cigarettes    Quit date: 06/23/1990  . Smokeless tobacco: Not on file  . Alcohol Use: No    Allergies  Allergen Reactions  . Penicillins     Current Outpatient Prescriptions  Medication Sig Dispense Refill  . albuterol (PROVENTIL HFA;VENTOLIN HFA) 108 (90 BASE) MCG/ACT inhaler Inhale 2 puffs into the lungs as needed.      [name] amLODipine (NORVASC) 5 MG tablet Take 1 tablet (5 mg total) by mouth daily.  30 tablet  6  . aspirin 81 MG tablet Take 81 mg by mouth daily.      . chlorthalidone  (HYGROTON) 25 MG tablet Take 1 tablet (25 mg total) by mouth daily.  30 tablet  3  . magnesium oxide (MAG-OX 400) 400 MG tablet Take 1 tablet (400 mg total) by mouth 2 (two) times daily.  60 tablet  3  . meloxicam (MOBIC) 7.5 MG tablet Take 7.5 mg by mouth as needed.       . metFORMIN (GLUMETZA) 500 MG (MOD) 24 hr tablet Take 1 tablet (500 mg total) by mouth 2 (two) times daily with a meal.  60 tablet  3  . NON FORMULARY Oxygen 2 liters.      . NON FORMULARY CPAP      . enalapril (VASOTEC) 20 MG tablet TAKE ONE TABLET BY MOUTH TWICE DAILY  60 tablet  0   No current facility-administered medications for this visit.    Review of Systems Review of Systems  Constitutional: Negative.   Respiratory: Negative.   Cardiovascular: Negative.     Blood pressure 180/110, pulse 86, resp. rate 16, height 5'  2" (1.575 m), weight 221 lb (100.245 kg).  Physical Exam Physical Exam Back incision looks clean mild swelling noted likely a seroma Data Reviewed Path reviewed lipoma  Assessment   doing well    Plan  Return in Two weeks-recheck the seroma.        SANKAR,SEEPLAPUTHUR G 11/18/2012, 9:24 AM

## 2012-11-18 ENCOUNTER — Encounter: Payer: Self-pay | Admitting: General Surgery

## 2012-12-01 ENCOUNTER — Ambulatory Visit (INDEPENDENT_AMBULATORY_CARE_PROVIDER_SITE_OTHER): Payer: Medicare Other | Admitting: General Surgery

## 2012-12-01 ENCOUNTER — Encounter: Payer: Self-pay | Admitting: General Surgery

## 2012-12-01 VITALS — BP 136/68 | HR 72 | Temp 98.7°F | Resp 14 | Ht 62.0 in | Wt 223.0 lb

## 2012-12-01 DIAGNOSIS — R222 Localized swelling, mass and lump, trunk: Secondary | ICD-10-CM

## 2012-12-01 DIAGNOSIS — D367 Benign neoplasm of other specified sites: Secondary | ICD-10-CM

## 2012-12-01 DIAGNOSIS — R229 Localized swelling, mass and lump, unspecified: Secondary | ICD-10-CM

## 2012-12-01 DIAGNOSIS — D216 Benign neoplasm of connective and other soft tissue of trunk, unspecified: Secondary | ICD-10-CM

## 2012-12-01 NOTE — Progress Notes (Signed)
Patient ID: Debra Saunders, female   DOB: Jul 12, 1945, 67 y.o.   MRN: [id number]  Chief Complaint  Patient presents with  . Follow-up    2 week follow up lipoma removal    HPI Debra Saunders is a 67 y.o. female who presents for a 2 week follow up of lipoma on back. The patient complains of soreness, tenderness, and swelling. She states over the weekend she had a fever with chills.   HPI  Past Medical History  Diagnosis Date  . Thyroid disorder   . Diabetes mellitus     type II  . Thyroid nodule   . Sleep apnea   . Obesity   . Hypertension   . Hyperlipidemia   . Urinary incontinence   . CHF (congestive heart failure) 2005    Dx at [facility]  . Chronic back pain   . COPD (chronic obstructive pulmonary disease)     2L [place]  . Sleep apnea   . Lipoma of back   . Arthritis     Past Surgical History  Procedure Laterality Date  . Cholecystectomy    . Vaginal hysterectomy    . Cardiac catheterization  2013    @ [place]: No obstructive CAD: Only 20% ostial left circumflex.  . Abdominal hysterectomy  1969  . Cyst removal trunk  2002    BACK  . Lipoma removal  2014    back    Family History  Problem Relation Age of Onset  . Heart disease Mother   . Hypertension Mother   . COPD Mother     was a smoker  . Thyroid disease Mother   . Hypertension Father   . Hypertension Sister   . Thyroid disease Sister   . Hypertension Sister   . Thyroid disease Sister   . Breast cancer Maternal Aunt     Social History History  Substance Use Topics  . Smoking status: Former Smoker -- 0.30 packs/day for 2 years    Types: Cigarettes    Quit date: 06/23/1990  . Smokeless tobacco: Not on file  . Alcohol Use: No    Allergies  Allergen Reactions  . Penicillins Hives and Swelling    Current Outpatient Prescriptions  Medication Sig Dispense Refill  . albuterol (PROVENTIL HFA;VENTOLIN HFA) 108 (90 BASE) MCG/ACT inhaler Inhale 2 puffs into the lungs as needed.      [name] amLODipine (NORVASC) 5 MG  tablet Take 1 tablet (5 mg total) by mouth daily.  30 tablet  6  . aspirin 81 MG tablet Take 81 mg by mouth daily.      . chlorthalidone (HYGROTON) 25 MG tablet Take 1 tablet (25 mg total) by mouth daily.  30 tablet  3  . enalapril (VASOTEC) 20 MG tablet TAKE ONE TABLET BY MOUTH TWICE DAILY  60 tablet  0  . magnesium oxide (MAG-OX 400) 400 MG tablet Take 1 tablet (400 mg total) by mouth 2 (two) times daily.  60 tablet  3  . meloxicam (MOBIC) 7.5 MG tablet Take 7.5 mg by mouth as needed.       . metFORMIN (GLUMETZA) 500 MG (MOD) 24 hr tablet Take 1 tablet (500 mg total) by mouth 2 (two) times daily with a meal.  60 tablet  3  . NON FORMULARY Oxygen 2 liters.      . NON FORMULARY CPAP       No current facility-administered medications for this visit.    Review of Systems Review of Systems  Constitutional:  Positive for fever and chills.  Respiratory: Negative.   Cardiovascular: Negative.     Blood pressure 136/68, pulse 72, temperature 98.7 F (37.1 C), temperature source Oral, resp. rate 14, height 5\' 2"  (1.575 m), weight 223 lb (101.152 kg).  Physical Exam Physical Exam Moderate size seroma noted on her back at site oof Lipoma excision.. No sign of infection.   Data Reviewed  None  Assessment    Seroma drainage 60 ml old blood.   18 gauge needle used after instillation of 1 ml of 1 % xylocaine. Swelling subsided fully after aspiration  Plan    Follow up in 10 days.       Shritha Bresee G 12/01/2012, 6:22 PM

## 2012-12-01 NOTE — Patient Instructions (Addendum)
Patient to return in 10 days for follow up appointment.

## 2012-12-07 ENCOUNTER — Ambulatory Visit: Payer: Medicare Other | Admitting: Internal Medicine

## 2012-12-13 ENCOUNTER — Encounter: Payer: Self-pay | Admitting: General Surgery

## 2012-12-13 ENCOUNTER — Ambulatory Visit (INDEPENDENT_AMBULATORY_CARE_PROVIDER_SITE_OTHER): Payer: Medicare Other | Admitting: General Surgery

## 2012-12-13 VITALS — BP 140/82 | HR 80 | Temp 97.9°F | Resp 18 | Ht 62.0 in | Wt 223.0 lb

## 2012-12-13 DIAGNOSIS — D367 Benign neoplasm of other specified sites: Secondary | ICD-10-CM

## 2012-12-13 DIAGNOSIS — D216 Benign neoplasm of connective and other soft tissue of trunk, unspecified: Secondary | ICD-10-CM

## 2012-12-13 NOTE — Progress Notes (Signed)
Patient ID: Debra Saunders, female   DOB: 1945/11/15, 67 y.o.   MRN: [id number]  Chief Complaint  Patient presents with  . Follow-up    12 day follow up seroma    HPI Debra Saunders is a 67 y.o. female who presents for a 12 day follow up visit of a seroma on her back. Still draining and fever last week.  HPI  Past Medical History  Diagnosis Date  . Thyroid disorder   . Diabetes mellitus     type II  . Thyroid nodule   . Sleep apnea   . Obesity   . Hypertension   . Hyperlipidemia   . Urinary incontinence   . CHF (congestive heart failure) 2005    Dx at [facility]  . Chronic back pain   . COPD (chronic obstructive pulmonary disease)     2L [place]  . Sleep apnea   . Lipoma of back   . Arthritis     Past Surgical History  Procedure Laterality Date  . Cholecystectomy    . Vaginal hysterectomy    . Cardiac catheterization  2013    @ [place]: No obstructive CAD: Only 20% ostial left circumflex.  . Abdominal hysterectomy  1969  . Cyst removal trunk  2002    BACK  . Lipoma removal  2014    back    Family History  Problem Relation Age of Onset  . Heart disease Mother   . Hypertension Mother   . COPD Mother     was a smoker  . Thyroid disease Mother   . Hypertension Father   . Hypertension Sister   . Thyroid disease Sister   . Hypertension Sister   . Thyroid disease Sister   . Breast cancer Maternal Aunt     Social History History  Substance Use Topics  . Smoking status: Former Smoker -- 0.30 packs/day for 2 years    Types: Cigarettes    Quit date: 06/23/1990  . Smokeless tobacco: Not on file  . Alcohol Use: No    Allergies  Allergen Reactions  . Penicillins Hives and Swelling    Current Outpatient Prescriptions  Medication Sig Dispense Refill  . albuterol (PROVENTIL HFA;VENTOLIN HFA) 108 (90 BASE) MCG/ACT inhaler Inhale 2 puffs into the lungs as needed.      [name] amLODipine (NORVASC) 5 MG tablet Take 1 tablet (5 mg total) by mouth daily.  30 tablet  6  . aspirin 81  MG tablet Take 81 mg by mouth daily.      . chlorthalidone (HYGROTON) 25 MG tablet Take 1 tablet (25 mg total) by mouth daily.  30 tablet  3  . enalapril (VASOTEC) 20 MG tablet TAKE ONE TABLET BY MOUTH TWICE DAILY  60 tablet  0  . magnesium oxide (MAG-OX 400) 400 MG tablet Take 1 tablet (400 mg total) by mouth 2 (two) times daily.  60 tablet  3  . meloxicam (MOBIC) 7.5 MG tablet Take 7.5 mg by mouth as needed.       . metFORMIN (GLUMETZA) 500 MG (MOD) 24 hr tablet Take 1 tablet (500 mg total) by mouth 2 (two) times daily with a meal.  60 tablet  3  . NON FORMULARY Oxygen 2 liters.      . NON FORMULARY CPAP       No current facility-administered medications for this visit.    Review of Systems Review of Systems  Constitutional: Positive for fever.  Respiratory: Negative.   Cardiovascular: Negative.  Blood pressure 140/82, pulse 80, temperature 97.9 F (36.6 C), temperature source Oral, resp. rate 18, height 5\' 2"  (1.575 m), weight 223 lb (101.152 kg).  Physical Exam Physical Exam Previous swelling has gone down considerably. 1 cm open wound in middle of incision with some slightly murky serous fluid. C/S obtained.   Data Reviewed None  Assessment    Improved seroma in lipoma incision site.      Plan    1 month.        Amory Zbikowski G 12/13/2012, 1:11 PM

## 2012-12-13 NOTE — Patient Instructions (Signed)
If culture grows any organisms we will call in antibiotics. Patient to return in 1 month.

## 2012-12-18 LAB — ANAEROBIC AND AEROBIC CULTURE

## 2013-01-04 ENCOUNTER — Encounter: Payer: Self-pay | Admitting: Internal Medicine

## 2013-01-05 ENCOUNTER — Other Ambulatory Visit: Payer: Self-pay | Admitting: *Deleted

## 2013-01-05 MED ORDER — ENALAPRIL MALEATE 20 MG PO TABS: ORAL_TABLET | ORAL | Status: DC

## 2013-01-11 ENCOUNTER — Inpatient Hospital Stay: Payer: Self-pay | Admitting: Internal Medicine

## 2013-01-11 LAB — CBC
HCT: 36.2 % (ref 35.0–47.0)
HGB: 11.5 g/dL — ABNORMAL LOW (ref 12.0–16.0)
MCH: 26.1 pg (ref 26.0–34.0)
MCHC: 31.6 g/dL — ABNORMAL LOW (ref 32.0–36.0)
MCV: 83 fL (ref 80–100)
Platelet: 240 10*3/uL (ref 150–440)
RBC: 4.39 10*6/uL (ref 3.80–5.20)
RDW: 14.3 % (ref 11.5–14.5)
WBC: 7.9 10*3/uL (ref 3.6–11.0)

## 2013-01-11 LAB — COMPREHENSIVE METABOLIC PANEL
Albumin: 3.3 g/dL — ABNORMAL LOW (ref 3.4–5.0)
Alkaline Phosphatase: 126 U/L (ref 50–136)
Anion Gap: 5 — ABNORMAL LOW (ref 7–16)
BUN: 8 mg/dL (ref 7–18)
Bilirubin,Total: 0.3 mg/dL (ref 0.2–1.0)
Calcium, Total: 9 mg/dL (ref 8.5–10.1)
Chloride: 101 mmol/L (ref 98–107)
Co2: 30 mmol/L (ref 21–32)
Creatinine: 0.69 mg/dL (ref 0.60–1.30)
EGFR (African American): >60
EGFR (Non-African Amer.): >60
Glucose: 142 mg/dL — ABNORMAL HIGH (ref 65–99)
Osmolality: 273 (ref 275–301)
Potassium: 3.2 mmol/L — ABNORMAL LOW (ref 3.5–5.1)
SGOT(AST): 45 U/L — ABNORMAL HIGH (ref 15–37)
SGPT (ALT): 42 U/L (ref 12–78)
Sodium: 136 mmol/L (ref 136–145)
Total Protein: 8.3 g/dL — ABNORMAL HIGH (ref 6.4–8.2)

## 2013-01-11 LAB — CK TOTAL AND CKMB (NOT AT ARMC)
CK, Total: 99 U/L (ref 21–215)
CK-MB: 0.7 ng/mL (ref 0.5–3.6)

## 2013-01-11 LAB — TROPONIN I
Troponin-I: <0.02 ng/mL
Troponin-I: <0.02 ng/mL

## 2013-01-12 ENCOUNTER — Ambulatory Visit: Payer: Medicare Other | Admitting: General Surgery

## 2013-01-12 DIAGNOSIS — I369 Nonrheumatic tricuspid valve disorder, unspecified: Secondary | ICD-10-CM

## 2013-01-12 LAB — URINALYSIS, COMPLETE
Bacteria: NONE SEEN
Bilirubin,UR: NEGATIVE
Blood: NEGATIVE
Glucose,UR: 50 mg/dL (ref 0–75)
Leukocyte Esterase: NEGATIVE
Nitrite: NEGATIVE
Ph: 5 (ref 4.5–8.0)
Protein: NEGATIVE
RBC,UR: 1 /HPF (ref 0–5)
Specific Gravity: 1.047 (ref 1.003–1.030)
Squamous Epithelial: <1
WBC UR: 1 /HPF (ref 0–5)

## 2013-01-12 LAB — BASIC METABOLIC PANEL
Anion Gap: 6 — ABNORMAL LOW (ref 7–16)
BUN: 13 mg/dL (ref 7–18)
Calcium, Total: 8.4 mg/dL — ABNORMAL LOW (ref 8.5–10.1)
Chloride: 99 mmol/L (ref 98–107)
Co2: 29 mmol/L (ref 21–32)
Creatinine: 0.94 mg/dL (ref 0.60–1.30)
EGFR (African American): >60
EGFR (Non-African Amer.): >60
Glucose: 212 mg/dL — ABNORMAL HIGH (ref 65–99)
Osmolality: 275 (ref 275–301)
Potassium: 3.3 mmol/L — ABNORMAL LOW (ref 3.5–5.1)
Sodium: 134 mmol/L — ABNORMAL LOW (ref 136–145)

## 2013-01-12 LAB — HEMOGLOBIN A1C: Hemoglobin A1C: 7.2 % — ABNORMAL HIGH (ref 4.2–6.3)

## 2013-01-12 LAB — LIPID PANEL
Cholesterol: 140 mg/dL (ref 0–200)
HDL Cholesterol: 54 mg/dL (ref 40–60)
Ldl Cholesterol, Calc: 61 mg/dL (ref 0–100)
Triglycerides: 123 mg/dL (ref 0–200)
VLDL Cholesterol, Calc: 25 mg/dL (ref 5–40)

## 2013-01-12 LAB — TSH: Thyroid Stimulating Horm: 1.11 u[IU]/mL

## 2013-01-12 LAB — TROPONIN I: Troponin-I: <0.02 ng/mL

## 2013-01-13 DIAGNOSIS — R079 Chest pain, unspecified: Secondary | ICD-10-CM

## 2013-01-13 LAB — BASIC METABOLIC PANEL
Anion Gap: 3 — ABNORMAL LOW (ref 7–16)
BUN: 20 mg/dL — ABNORMAL HIGH (ref 7–18)
Calcium, Total: 9 mg/dL (ref 8.5–10.1)
Chloride: 94 mmol/L — ABNORMAL LOW (ref 98–107)
Co2: 31 mmol/L (ref 21–32)
Creatinine: 1.01 mg/dL (ref 0.60–1.30)
EGFR (African American): >60
EGFR (Non-African Amer.): 57 — ABNORMAL LOW
Glucose: 273 mg/dL — ABNORMAL HIGH (ref 65–99)
Osmolality: 269 (ref 275–301)
Potassium: 4.3 mmol/L (ref 3.5–5.1)
Sodium: 128 mmol/L — ABNORMAL LOW (ref 136–145)

## 2013-01-13 LAB — CK TOTAL AND CKMB (NOT AT ARMC)
CK, Total: 89 U/L (ref 21–215)
CK, Total: 87 U/L (ref 21–215)
CK-MB: <0.5 ng/mL — ABNORMAL LOW (ref 0.5–3.6)
CK-MB: <0.5 ng/mL — ABNORMAL LOW (ref 0.5–3.6)

## 2013-01-13 LAB — SEDIMENTATION RATE: Erythrocyte Sed Rate: 66 mm/hr — ABNORMAL HIGH (ref 0–30)

## 2013-01-14 LAB — BASIC METABOLIC PANEL
Anion Gap: 8 (ref 7–16)
BUN: 18 mg/dL (ref 7–18)
Calcium, Total: 9.1 mg/dL (ref 8.5–10.1)
Chloride: 95 mmol/L — ABNORMAL LOW (ref 98–107)
Co2: 30 mmol/L (ref 21–32)
Creatinine: 0.84 mg/dL (ref 0.60–1.30)
EGFR (African American): >60
EGFR (Non-African Amer.): >60
Glucose: 291 mg/dL — ABNORMAL HIGH (ref 65–99)
Osmolality: 279 (ref 275–301)
Potassium: 4.1 mmol/L (ref 3.5–5.1)
Sodium: 133 mmol/L — ABNORMAL LOW (ref 136–145)

## 2013-01-15 LAB — BASIC METABOLIC PANEL
Anion Gap: 5 — ABNORMAL LOW (ref 7–16)
BUN: 21 mg/dL — ABNORMAL HIGH (ref 7–18)
Calcium, Total: 9.2 mg/dL (ref 8.5–10.1)
Chloride: 91 mmol/L — ABNORMAL LOW (ref 98–107)
Co2: 33 mmol/L — ABNORMAL HIGH (ref 21–32)
Creatinine: 0.88 mg/dL (ref 0.60–1.30)
EGFR (African American): >60
EGFR (Non-African Amer.): >60
Glucose: 345 mg/dL — ABNORMAL HIGH (ref 65–99)
Osmolality: 276 (ref 275–301)
Potassium: 4 mmol/L (ref 3.5–5.1)
Sodium: 129 mmol/L — ABNORMAL LOW (ref 136–145)

## 2013-01-15 LAB — PLATELET COUNT: Platelet: 205 10*3/uL (ref 150–440)

## 2013-01-16 ENCOUNTER — Inpatient Hospital Stay (HOSPITAL_COMMUNITY)
Admission: AD | Admit: 2013-01-16 | Discharge: 2013-01-27 | DRG: 040 | Disposition: A | Discharge status: Home / Self Care | Payer: Medicare Other | Source: Other Acute Inpatient Hospital | Attending: Internal Medicine | Admitting: Internal Medicine

## 2013-01-16 ENCOUNTER — Inpatient Hospital Stay (HOSPITAL_COMMUNITY): Payer: Medicare Other

## 2013-01-16 DIAGNOSIS — Z7982 Long term (current) use of aspirin: Secondary | ICD-10-CM

## 2013-01-16 DIAGNOSIS — IMO0001 Reserved for inherently not codable concepts without codable children: Secondary | ICD-10-CM | POA: Diagnosis present

## 2013-01-16 DIAGNOSIS — R0789 Other chest pain: Secondary | ICD-10-CM

## 2013-01-16 DIAGNOSIS — Z9981 Dependence on supplemental oxygen: Secondary | ICD-10-CM

## 2013-01-16 DIAGNOSIS — R079 Chest pain, unspecified: Secondary | ICD-10-CM

## 2013-01-16 DIAGNOSIS — D367 Benign neoplasm of other specified sites: Secondary | ICD-10-CM

## 2013-01-16 DIAGNOSIS — R229 Localized swelling, mass and lump, unspecified: Secondary | ICD-10-CM

## 2013-01-16 DIAGNOSIS — I509 Heart failure, unspecified: Secondary | ICD-10-CM | POA: Diagnosis present

## 2013-01-16 DIAGNOSIS — E785 Hyperlipidemia, unspecified: Secondary | ICD-10-CM

## 2013-01-16 DIAGNOSIS — I5022 Chronic systolic (congestive) heart failure: Secondary | ICD-10-CM | POA: Diagnosis present

## 2013-01-16 DIAGNOSIS — I2089 Other forms of angina pectoris: Secondary | ICD-10-CM

## 2013-01-16 DIAGNOSIS — Z1239 Encounter for other screening for malignant neoplasm of breast: Secondary | ICD-10-CM

## 2013-01-16 DIAGNOSIS — R32 Unspecified urinary incontinence: Secondary | ICD-10-CM | POA: Diagnosis present

## 2013-01-16 DIAGNOSIS — E039 Hypothyroidism, unspecified: Secondary | ICD-10-CM

## 2013-01-16 DIAGNOSIS — I609 Nontraumatic subarachnoid hemorrhage, unspecified: Principal | ICD-10-CM

## 2013-01-16 DIAGNOSIS — J449 Chronic obstructive pulmonary disease, unspecified: Secondary | ICD-10-CM | POA: Diagnosis present

## 2013-01-16 DIAGNOSIS — I272 Pulmonary hypertension, unspecified: Secondary | ICD-10-CM

## 2013-01-16 DIAGNOSIS — M129 Arthropathy, unspecified: Secondary | ICD-10-CM | POA: Diagnosis present

## 2013-01-16 DIAGNOSIS — Z87891 Personal history of nicotine dependence: Secondary | ICD-10-CM

## 2013-01-16 DIAGNOSIS — G8929 Other chronic pain: Secondary | ICD-10-CM | POA: Diagnosis present

## 2013-01-16 DIAGNOSIS — Z79899 Other long term (current) drug therapy: Secondary | ICD-10-CM

## 2013-01-16 DIAGNOSIS — E041 Nontoxic single thyroid nodule: Secondary | ICD-10-CM | POA: Diagnosis present

## 2013-01-16 DIAGNOSIS — R222 Localized swelling, mass and lump, trunk: Secondary | ICD-10-CM

## 2013-01-16 DIAGNOSIS — I10 Essential (primary) hypertension: Secondary | ICD-10-CM

## 2013-01-16 DIAGNOSIS — R7881 Bacteremia: Secondary | ICD-10-CM | POA: Diagnosis present

## 2013-01-16 DIAGNOSIS — R5383 Other fatigue: Secondary | ICD-10-CM

## 2013-01-16 DIAGNOSIS — I469 Cardiac arrest, cause unspecified: Secondary | ICD-10-CM

## 2013-01-16 DIAGNOSIS — Z01810 Encounter for preprocedural cardiovascular examination: Secondary | ICD-10-CM

## 2013-01-16 DIAGNOSIS — D216 Benign neoplasm of connective and other soft tissue of trunk, unspecified: Secondary | ICD-10-CM

## 2013-01-16 DIAGNOSIS — E119 Type 2 diabetes mellitus without complications: Secondary | ICD-10-CM

## 2013-01-16 DIAGNOSIS — I208 Other forms of angina pectoris: Secondary | ICD-10-CM

## 2013-01-16 DIAGNOSIS — T45515A Adverse effect of anticoagulants, initial encounter: Secondary | ICD-10-CM | POA: Diagnosis present

## 2013-01-16 DIAGNOSIS — R600 Localized edema: Secondary | ICD-10-CM

## 2013-01-16 DIAGNOSIS — J9600 Acute respiratory failure, unspecified whether with hypoxia or hypercapnia: Secondary | ICD-10-CM

## 2013-01-16 DIAGNOSIS — M549 Dorsalgia, unspecified: Secondary | ICD-10-CM | POA: Diagnosis present

## 2013-01-16 DIAGNOSIS — M25562 Pain in left knee: Secondary | ICD-10-CM

## 2013-01-16 DIAGNOSIS — I2699 Other pulmonary embolism without acute cor pulmonale: Secondary | ICD-10-CM

## 2013-01-16 DIAGNOSIS — I5032 Chronic diastolic (congestive) heart failure: Secondary | ICD-10-CM

## 2013-01-16 DIAGNOSIS — R0602 Shortness of breath: Secondary | ICD-10-CM

## 2013-01-16 DIAGNOSIS — A491 Streptococcal infection, unspecified site: Secondary | ICD-10-CM | POA: Diagnosis present

## 2013-01-16 DIAGNOSIS — D649 Anemia, unspecified: Secondary | ICD-10-CM

## 2013-01-16 DIAGNOSIS — K209 Esophagitis, unspecified without bleeding: Secondary | ICD-10-CM | POA: Diagnosis present

## 2013-01-16 DIAGNOSIS — E669 Obesity, unspecified: Secondary | ICD-10-CM | POA: Diagnosis present

## 2013-01-16 DIAGNOSIS — J4489 Other specified chronic obstructive pulmonary disease: Secondary | ICD-10-CM | POA: Diagnosis present

## 2013-01-16 DIAGNOSIS — R569 Unspecified convulsions: Secondary | ICD-10-CM

## 2013-01-16 DIAGNOSIS — G4733 Obstructive sleep apnea (adult) (pediatric): Secondary | ICD-10-CM

## 2013-01-16 DIAGNOSIS — R001 Bradycardia, unspecified: Secondary | ICD-10-CM

## 2013-01-16 DIAGNOSIS — M940 Chondrocostal junction syndrome [Tietze]: Secondary | ICD-10-CM | POA: Diagnosis present

## 2013-01-16 LAB — MAGNESIUM: Magnesium: 2.1 mg/dL

## 2013-01-16 LAB — COMPREHENSIVE METABOLIC PANEL
Albumin: 2.6 g/dL — ABNORMAL LOW (ref 3.4–5.0)
Alkaline Phosphatase: 149 U/L — ABNORMAL HIGH (ref 50–136)
Anion Gap: 9 (ref 7–16)
BUN: 45 mg/dL — ABNORMAL HIGH (ref 7–18)
Bilirubin,Total: 0.7 mg/dL (ref 0.2–1.0)
Calcium, Total: 9 mg/dL (ref 8.5–10.1)
Chloride: 87 mmol/L — ABNORMAL LOW (ref 98–107)
Co2: 30 mmol/L (ref 21–32)
Creatinine: 1.54 mg/dL — ABNORMAL HIGH (ref 0.60–1.30)
EGFR (African American): 39 — ABNORMAL LOW
EGFR (Non-African Amer.): 34 — ABNORMAL LOW
Glucose: 300 mg/dL — ABNORMAL HIGH (ref 65–99)
Osmolality: 276 (ref 275–301)
Potassium: 3.7 mmol/L (ref 3.5–5.1)
SGOT(AST): 71 U/L — ABNORMAL HIGH (ref 15–37)
SGPT (ALT): 107 U/L — ABNORMAL HIGH (ref 12–78)
Sodium: 126 mmol/L — ABNORMAL LOW (ref 136–145)
Total Protein: 8 g/dL (ref 6.4–8.2)

## 2013-01-16 LAB — BASIC METABOLIC PANEL
Anion Gap: 6 — ABNORMAL LOW (ref 7–16)
BUN: 30 mg/dL — ABNORMAL HIGH (ref 7–18)
Calcium, Total: 9.1 mg/dL (ref 8.5–10.1)
Chloride: 88 mmol/L — ABNORMAL LOW (ref 98–107)
Co2: 34 mmol/L — ABNORMAL HIGH (ref 21–32)
Creatinine: 1.2 mg/dL (ref 0.60–1.30)
EGFR (African American): 53 — ABNORMAL LOW
EGFR (Non-African Amer.): 46 — ABNORMAL LOW
Glucose: 250 mg/dL — ABNORMAL HIGH (ref 65–99)
Osmolality: 272 (ref 275–301)
Potassium: 3.1 mmol/L — ABNORMAL LOW (ref 3.5–5.1)
Sodium: 128 mmol/L — ABNORMAL LOW (ref 136–145)

## 2013-01-16 LAB — CBC WITH DIFFERENTIAL/PLATELET
Basophil #: 0.1 10*3/uL (ref 0.0–0.1)
Basophil %: 0.3 %
Eosinophil #: 0 10*3/uL (ref 0.0–0.7)
Eosinophil %: 0.1 %
HCT: 35.7 % (ref 35.0–47.0)
HGB: 11.3 g/dL — ABNORMAL LOW (ref 12.0–16.0)
Lymphocyte #: 1.6 10*3/uL (ref 1.0–3.6)
Lymphocyte %: 7.5 %
MCH: 25.5 pg — ABNORMAL LOW (ref 26.0–34.0)
MCHC: 31.6 g/dL — ABNORMAL LOW (ref 32.0–36.0)
MCV: 81 fL (ref 80–100)
Monocyte #: 2 x10 3/mm — ABNORMAL HIGH (ref 0.2–0.9)
Monocyte %: 9 %
Neutrophil #: 18 10*3/uL — ABNORMAL HIGH (ref 1.4–6.5)
Neutrophil %: 83.1 %
Platelet: 212 10*3/uL (ref 150–440)
RBC: 4.43 10*6/uL (ref 3.80–5.20)
RDW: 14.5 % (ref 11.5–14.5)
WBC: 21.7 10*3/uL — ABNORMAL HIGH (ref 3.6–11.0)

## 2013-01-16 LAB — PROTIME-INR
INR: 2
Prothrombin Time: 22.1 secs — ABNORMAL HIGH (ref 11.5–14.7)

## 2013-01-16 LAB — TROPONIN I: Troponin-I: <0.02 ng/mL

## 2013-01-16 LAB — CK-MB: CK-MB: <0.5 ng/mL — ABNORMAL LOW (ref 0.5–3.6)

## 2013-01-16 LAB — LIPASE, BLOOD: Lipase: 262 U/L (ref 73–393)

## 2013-01-16 LAB — CK: CK, Total: 48 U/L (ref 21–215)

## 2013-01-16 LAB — APTT: Activated PTT: 30.1 secs (ref 23.6–35.9)

## 2013-01-16 MED ORDER — SODIUM CHLORIDE 0.9 % IV SOLN
250.0000 mL | INTRAVENOUS | Status: DC | PRN
  Administered 2013-01-17: 01:00:00 via INTRAVENOUS

## 2013-01-16 MED ORDER — FAMOTIDINE IN NACL 20-0.9 MG/50ML-% IV SOLN
20.0000 mg | Freq: Two times a day (BID) | INTRAVENOUS | Status: DC
  Administered 2013-01-17 – 2013-01-18 (×4): 20 mg via INTRAVENOUS
  Filled 2013-01-16 (×5): qty 50

## 2013-01-16 MED ORDER — SODIUM CHLORIDE 0.9 % IV SOLN
25.0000 ug/h | INTRAVENOUS | Status: DC
  Filled 2013-01-16: qty 50

## 2013-01-16 MED ORDER — SODIUM CHLORIDE 0.9 % IV SOLN
INTRAVENOUS | Status: DC
  Administered 2013-01-17: 0.9 [IU]/h via INTRAVENOUS
  Filled 2013-01-16: qty 1

## 2013-01-16 MED ORDER — FENTANYL BOLUS VIA INFUSION
25.0000 ug | Freq: Four times a day (QID) | INTRAVENOUS | Status: DC | PRN
  Filled 2013-01-16: qty 100

## 2013-01-16 MED ORDER — MIDAZOLAM BOLUS VIA INFUSION
1.0000 mg | INTRAVENOUS | Status: DC | PRN
  Filled 2013-01-16: qty 2

## 2013-01-16 MED ORDER — SODIUM CHLORIDE 0.9 % IV SOLN
1.0000 mg/h | INTRAVENOUS | Status: DC
  Filled 2013-01-16: qty 10

## 2013-01-16 NOTE — Progress Notes (Signed)
eLink Physician-Brief Progress Note Patient Name: Leonard President DOB: 1945/11/07 MRN: [id number]  Date of Service  01/16/2013   HPI/Events of Note   Pt admitted from [place], admitted there w CP + DVT, suspected PE. Intubated for acute resp failure. treated with xarelto. Transferred to [facility] with [place].   eICU Interventions  - initial admission orders placed  - Dr Elmon Kirschner to evaluate, review outside Head CT data to decide whether NSGY needs to be involved this pm, timing of repeat Head CT   Intervention Category Major Interventions: Respiratory failure - evaluation and management  Zoye Chandra S. 01/16/2013, 11:36 PM

## 2013-01-17 ENCOUNTER — Inpatient Hospital Stay (HOSPITAL_COMMUNITY): Payer: Medicare Other

## 2013-01-17 ENCOUNTER — Encounter (HOSPITAL_COMMUNITY): Payer: Self-pay | Admitting: Radiology

## 2013-01-17 DIAGNOSIS — M79609 Pain in unspecified limb: Secondary | ICD-10-CM

## 2013-01-17 DIAGNOSIS — G4733 Obstructive sleep apnea (adult) (pediatric): Secondary | ICD-10-CM

## 2013-01-17 DIAGNOSIS — E119 Type 2 diabetes mellitus without complications: Secondary | ICD-10-CM

## 2013-01-17 DIAGNOSIS — J9600 Acute respiratory failure, unspecified whether with hypoxia or hypercapnia: Secondary | ICD-10-CM

## 2013-01-17 DIAGNOSIS — R0602 Shortness of breath: Secondary | ICD-10-CM

## 2013-01-17 DIAGNOSIS — I2789 Other specified pulmonary heart diseases: Secondary | ICD-10-CM

## 2013-01-17 LAB — BLOOD GAS, ARTERIAL
Acid-Base Excess: 10 mmol/L — ABNORMAL HIGH (ref 0.0–2.0)
Acid-Base Excess: 7.9 mmol/L — ABNORMAL HIGH (ref 0.0–2.0)
Bicarbonate: 32.7 mEq/L — ABNORMAL HIGH (ref 20.0–24.0)
Bicarbonate: 34.4 mEq/L — ABNORMAL HIGH (ref 20.0–24.0)
Drawn by: 347641
FIO2: 0.4 %
FIO2: 0.4 %
MECHVT: 380 mL
MECHVT: 500 mL
O2 Saturation: 99 %
O2 Saturation: 99.1 %
PEEP: 5 cmH2O
PEEP: 5 cmH2O
Patient temperature: 98.6
Patient temperature: 98.6
RATE: 16 resp/min
RATE: 16 resp/min
TCO2: 34.3 mmol/L (ref 0–100)
TCO2: 35.9 mmol/L (ref 0–100)
pCO2 arterial: 49.4 mmHg — ABNORMAL HIGH (ref 35.0–45.0)
pCO2 arterial: 52.8 mmHg — ABNORMAL HIGH (ref 35.0–45.0)
pH, Arterial: 7.409 (ref 7.350–7.450)
pH, Arterial: 7.457 — ABNORMAL HIGH (ref 7.350–7.450)
pO2, Arterial: 126 mmHg — ABNORMAL HIGH (ref 80.0–100.0)
pO2, Arterial: 98.2 mmHg (ref 80.0–100.0)

## 2013-01-17 LAB — PROTIME-INR
INR: 1.34 (ref 0.00–1.49)
Prothrombin Time: 16.3 seconds — ABNORMAL HIGH (ref 11.6–15.2)

## 2013-01-17 LAB — APTT: aPTT: 28 seconds (ref 24–37)

## 2013-01-17 LAB — GLUCOSE, CAPILLARY
Glucose-Capillary: 136 mg/dL — ABNORMAL HIGH (ref 70–99)
Glucose-Capillary: 141 mg/dL — ABNORMAL HIGH (ref 70–99)
Glucose-Capillary: 150 mg/dL — ABNORMAL HIGH (ref 70–99)
Glucose-Capillary: 161 mg/dL — ABNORMAL HIGH (ref 70–99)
Glucose-Capillary: 172 mg/dL — ABNORMAL HIGH (ref 70–99)
Glucose-Capillary: 173 mg/dL — ABNORMAL HIGH (ref 70–99)
Glucose-Capillary: 176 mg/dL — ABNORMAL HIGH (ref 70–99)
Glucose-Capillary: 184 mg/dL — ABNORMAL HIGH (ref 70–99)
Glucose-Capillary: 184 mg/dL — ABNORMAL HIGH (ref 70–99)
Glucose-Capillary: 191 mg/dL — ABNORMAL HIGH (ref 70–99)
Glucose-Capillary: 204 mg/dL — ABNORMAL HIGH (ref 70–99)
Glucose-Capillary: 213 mg/dL — ABNORMAL HIGH (ref 70–99)

## 2013-01-17 LAB — COMPREHENSIVE METABOLIC PANEL
ALT: 74 U/L — ABNORMAL HIGH (ref 0–35)
AST: 49 U/L — ABNORMAL HIGH (ref 0–37)
Albumin: 2.7 g/dL — ABNORMAL LOW (ref 3.5–5.2)
Alkaline Phosphatase: 105 U/L (ref 39–117)
BUN: 35 mg/dL — ABNORMAL HIGH (ref 6–23)
CO2: 34 mEq/L — ABNORMAL HIGH (ref 19–32)
Calcium: 8.9 mg/dL (ref 8.4–10.5)
Chloride: 91 mEq/L — ABNORMAL LOW (ref 96–112)
Creatinine, Ser: 0.8 mg/dL (ref 0.50–1.10)
GFR calc Af Amer: 85 mL/min — ABNORMAL LOW (ref >90)
GFR calc non Af Amer: 74 mL/min — ABNORMAL LOW (ref >90)
Glucose, Bld: 141 mg/dL — ABNORMAL HIGH (ref 70–99)
Potassium: 3.7 mEq/L (ref 3.5–5.1)
Sodium: 131 mEq/L — ABNORMAL LOW (ref 135–145)
Total Bilirubin: 0.5 mg/dL (ref 0.3–1.2)
Total Protein: 7.1 g/dL (ref 6.0–8.3)

## 2013-01-17 LAB — CBC
HCT: 30 % — ABNORMAL LOW (ref 36.0–46.0)
Hemoglobin: 10.1 g/dL — ABNORMAL LOW (ref 12.0–15.0)
MCH: 26.7 pg (ref 26.0–34.0)
MCHC: 33.7 g/dL (ref 30.0–36.0)
MCV: 79.4 fL (ref 78.0–100.0)
Platelets: 184 10*3/uL (ref 150–400)
RBC: 3.78 MIL/uL — ABNORMAL LOW (ref 3.87–5.11)
RDW: 14.1 % (ref 11.5–15.5)
WBC: 17.5 10*3/uL — ABNORMAL HIGH (ref 4.0–10.5)

## 2013-01-17 LAB — TYPE AND SCREEN
ABO/RH(D): B POS
Antibody Screen: NEGATIVE

## 2013-01-17 LAB — ABO/RH: ABO/RH(D): B POS

## 2013-01-17 LAB — PHOSPHORUS: Phosphorus: 3.3 mg/dL (ref 2.3–4.6)

## 2013-01-17 LAB — MAGNESIUM: Magnesium: 2.6 mg/dL — ABNORMAL HIGH (ref 1.5–2.5)

## 2013-01-17 LAB — MRSA PCR SCREENING: MRSA by PCR: NEGATIVE

## 2013-01-17 MED ORDER — VANCOMYCIN HCL IN DEXTROSE 1-5 GM/200ML-% IV SOLN
1000.0000 mg | Freq: Two times a day (BID) | INTRAVENOUS | Status: DC
  Administered 2013-01-17 – 2013-01-19 (×4): 1000 mg via INTRAVENOUS
  Filled 2013-01-17 (×5): qty 200

## 2013-01-17 MED ORDER — MIDAZOLAM HCL 2 MG/2ML IJ SOLN
INTRAMUSCULAR | Status: AC
  Filled 2013-01-17: qty 6

## 2013-01-17 MED ORDER — CHLORHEXIDINE GLUCONATE 0.12 % MT SOLN
15.0000 mL | Freq: Two times a day (BID) | OROMUCOSAL | Status: DC
  Administered 2013-01-17 – 2013-01-27 (×19): 15 mL via OROMUCOSAL
  Filled 2013-01-17 (×26): qty 15

## 2013-01-17 MED ORDER — FENTANYL CITRATE 0.05 MG/ML IJ SOLN
INTRAMUSCULAR | Status: AC | PRN
  Administered 2013-01-17 (×2): 50 ug via INTRAVENOUS

## 2013-01-17 MED ORDER — FENTANYL CITRATE 0.05 MG/ML IJ SOLN
INTRAMUSCULAR | Status: AC
  Filled 2013-01-17: qty 4

## 2013-01-17 MED ORDER — POTASSIUM CHLORIDE CRYS ER 20 MEQ PO TBCR
40.0000 meq | EXTENDED_RELEASE_TABLET | Freq: Three times a day (TID) | ORAL | Status: AC
  Administered 2013-01-17: 40 meq via ORAL
  Filled 2013-01-17: qty 2

## 2013-01-17 MED ORDER — INSULIN ASPART 100 UNIT/ML ~~LOC~~ SOLN
2.0000 [IU] | SUBCUTANEOUS | Status: DC
  Administered 2013-01-17 (×2): 6 [IU] via SUBCUTANEOUS
  Administered 2013-01-17 – 2013-01-18 (×2): 4 [IU] via SUBCUTANEOUS
  Administered 2013-01-18: 2 [IU] via SUBCUTANEOUS
  Administered 2013-01-18: 4 [IU] via SUBCUTANEOUS
  Administered 2013-01-18: 2 [IU] via SUBCUTANEOUS

## 2013-01-17 MED ORDER — KETOROLAC TROMETHAMINE 30 MG/ML IJ SOLN
INTRAMUSCULAR | Status: AC
  Administered 2013-01-17: 15 mg
  Filled 2013-01-17: qty 1

## 2013-01-17 MED ORDER — MIDAZOLAM HCL 2 MG/2ML IJ SOLN
1.0000 mg | INTRAMUSCULAR | Status: DC | PRN
  Administered 2013-01-17: 1 mg via INTRAVENOUS
  Filled 2013-01-17: qty 2

## 2013-01-17 MED ORDER — IOHEXOL 300 MG/ML  SOLN
100.0000 mL | Freq: Once | INTRAMUSCULAR | Status: AC | PRN
  Administered 2013-01-17: 1 mL via INTRAVENOUS

## 2013-01-17 MED ORDER — DEXTROSE 10 % IV SOLN: INTRAVENOUS | Status: DC | PRN

## 2013-01-17 MED ORDER — FUROSEMIDE 10 MG/ML IJ SOLN: 20.0000 mg | Freq: Four times a day (QID) | INTRAMUSCULAR | Status: DC

## 2013-01-17 MED ORDER — ACETAMINOPHEN 325 MG PO TABS
650.0000 mg | ORAL_TABLET | ORAL | Status: DC | PRN
  Administered 2013-01-19 – 2013-01-23 (×5): 650 mg via ORAL
  Filled 2013-01-17 (×5): qty 2

## 2013-01-17 MED ORDER — FENTANYL CITRATE 0.05 MG/ML IJ SOLN
25.0000 ug | INTRAMUSCULAR | Status: DC | PRN
  Administered 2013-01-17 – 2013-01-19 (×9): 25 ug via INTRAVENOUS
  Filled 2013-01-17 (×10): qty 2

## 2013-01-17 MED ORDER — CARVEDILOL 3.125 MG PO TABS
3.1250 mg | ORAL_TABLET | Freq: Two times a day (BID) | ORAL | Status: DC
  Administered 2013-01-17 – 2013-01-27 (×19): 3.125 mg via ORAL
  Filled 2013-01-17 (×24): qty 1

## 2013-01-17 MED ORDER — INSULIN GLARGINE 100 UNIT/ML ~~LOC~~ SOLN
10.0000 [IU] | SUBCUTANEOUS | Status: DC
  Administered 2013-01-17: 10 [IU] via SUBCUTANEOUS
  Filled 2013-01-17: qty 0.1

## 2013-01-17 MED ORDER — KETOROLAC TROMETHAMINE 15 MG/ML IJ SOLN
15.0000 mg | Freq: Four times a day (QID) | INTRAMUSCULAR | Status: DC | PRN
  Administered 2013-01-17 – 2013-01-19 (×7): 15 mg via INTRAVENOUS
  Filled 2013-01-17 (×7): qty 1

## 2013-01-17 MED ORDER — MIDAZOLAM HCL 2 MG/2ML IJ SOLN
INTRAMUSCULAR | Status: AC | PRN
  Administered 2013-01-17: 2 mg via INTRAVENOUS
  Administered 2013-01-17: 1 mg via INTRAVENOUS

## 2013-01-17 MED ORDER — VANCOMYCIN HCL 10 G IV SOLR
1500.0000 mg | Freq: Once | INTRAVENOUS | Status: AC
  Administered 2013-01-17: 1500 mg via INTRAVENOUS
  Filled 2013-01-17: qty 1500

## 2013-01-17 MED ORDER — NAPROXEN 250 MG PO TABS
250.0000 mg | ORAL_TABLET | Freq: Two times a day (BID) | ORAL | Status: DC
  Administered 2013-01-17 – 2013-01-19 (×4): 250 mg via ORAL
  Filled 2013-01-17 (×8): qty 1

## 2013-01-17 MED ORDER — FENTANYL CITRATE 0.05 MG/ML IJ SOLN
25.0000 ug | INTRAMUSCULAR | Status: DC | PRN
  Administered 2013-01-17 (×2): 50 ug via INTRAVENOUS
  Filled 2013-01-17 (×2): qty 2

## 2013-01-17 MED ORDER — BIOTENE DRY MOUTH MT LIQD
15.0000 mL | Freq: Four times a day (QID) | OROMUCOSAL | Status: DC
  Administered 2013-01-17 – 2013-01-27 (×21): 15 mL via OROMUCOSAL

## 2013-01-17 NOTE — H&P (Signed)
PULMONARY  / CRITICAL CARE MEDICINE  Name: Debra Saunders MRN: [id number] DOB: 24-Dec-1945    ADMISSION DATE:  01/16/2013  PRIMARY SERVICE: PCCM  CHIEF COMPLAINT:  SAH. Transferred from [facility].  BRIEF PATIENT DESCRIPTION:  67 years old female with PMH relevant for DM, HTN, diastolic CHF, PAH, COPD, OSA, obesity. Transferred intubated from [place] with DVT, questionable PE and Small frontal SAH after starting Xarelto.   SIGNIFICANT EVENTS / STUDIES:  - Small right SAH on head CT from [place].  LINES / TUBES: - Peripheral IV's  CULTURES: No cultures.   ANTIBIOTICS: No antibiotics  HISTORY OF PRESENT ILLNESS:   67 years old female with PMH relevant for DM, HTN, diastolic CHF, PAH, COPD, OSA, obesity. Admitted to [facility] about 5 days ago with severe CP that was described more as musculoskeletal. LE dopplers were positive for DVT and a CTA showed a questionable small PE. She was started on Xarelto. Apparently today the patient decompensated acutely with altered mental status, bradycardia with questionable PEA arrest (there is no discharge summary from [facility] and most of the records provided are useless) The patient was intubated and a CT scan of the head showed a small frontal SAH. The patient was transferred to [facility] for neurosurgical consultation. At the time of my examination the patient is intubated, awake, alert, oriented and following commands. No obvious focal neurological signs. The patient continues to complain of CP and and has pain from the ET tube. The patient is hemodynamically stable.  PAST MEDICAL HISTORY :  Past Medical History  Diagnosis Date  . Thyroid disorder   . Diabetes mellitus     type II  . Thyroid nodule   . Sleep apnea   . Obesity   . Hypertension   . Hyperlipidemia   . Urinary incontinence   . CHF (congestive heart failure) 2005    Dx at [facility]  . Chronic back pain   . COPD (chronic obstructive pulmonary disease)     2L [place]  . Sleep apnea    . Lipoma of back   . Arthritis    Past Surgical History  Procedure Laterality Date  . Cholecystectomy    . Vaginal hysterectomy    . Cardiac catheterization  2013    @ [place]: No obstructive CAD: Only 20% ostial left circumflex.  . Abdominal hysterectomy  1969  . Cyst removal trunk  2002    BACK  . Lipoma removal  2014    back   Prior to Admission medications   Medication Sig Start Date End Date Taking? Authorizing Provider  albuterol (PROVENTIL HFA;VENTOLIN HFA) 108 (90 BASE) MCG/ACT inhaler Inhale 2 puffs into the lungs as needed.    Historical Provider, MD  amLODipine (NORVASC) 5 MG tablet Take 1 tablet (5 mg total) by mouth daily. 09/27/12   [name], MD  aspirin 81 MG tablet Take 81 mg by mouth daily.    Historical Provider, MD  chlorthalidone (HYGROTON) 25 MG tablet Take 1 tablet (25 mg total) by mouth daily. 10/22/12   [name], MD  enalapril (VASOTEC) 20 MG tablet TAKE ONE TABLET BY MOUTH TWICE DAILY 01/05/13   [name], MD  magnesium oxide (MAG-OX 400) 400 MG tablet Take 1 tablet (400 mg total) by mouth 2 (two) times daily. 09/27/12   [name], MD  meloxicam (MOBIC) 7.5 MG tablet Take 7.5 mg by mouth as needed.     Historical Provider, MD  metFORMIN (GLUMETZA) 500 MG (MOD) 24  hr tablet Take 1 tablet (500 mg total) by mouth 2 (two) times daily with a meal. 10/22/12   [name], MD  NON FORMULARY Oxygen 2 liters.    Historical Provider, MD  NON FORMULARY CPAP    Historical Provider, MD   Allergies  Allergen Reactions  . Penicillins Hives and Swelling    FAMILY HISTORY:  Family History  Problem Relation Age of Onset  . Heart disease Mother   . Hypertension Mother   . COPD Mother     was a smoker  . Thyroid disease Mother   . Hypertension Father   . Hypertension Sister   . Thyroid disease Sister   . Hypertension Sister   . Thyroid disease Sister   . Breast cancer Maternal Aunt    SOCIAL HISTORY:  reports  that she quit smoking about 22 years ago. Her smoking use included Cigarettes. She has a .6 pack-year smoking history. She does not have any smokeless tobacco history on file. She reports that she does not drink alcohol or use illicit drugs.  REVIEW OF SYSTEMS:  Unable to provide  SUBJECTIVE:   VITAL SIGNS: Temp:  [98.9 F (37.2 C)] 98.9 F (37.2 C) (07/27 2351) FiO2 (%):  [39.8 %-40 %] 39.8 % (07/28 0039) Weight:  [222 lb 14.2 oz (101.1 kg)] 222 lb 14.2 oz (101.1 kg) (07/27 2300) HEMODYNAMICS:   VENTILATOR SETTINGS: Vent Mode:  [-] PRVC FiO2 (%):  [39.8 %-40 %] 39.8 % Set Rate:  [14 bmp] 14 bmp Vt Set:  [380 mL-500 mL] 500 mL PEEP:  [5 cmH20-5.2 cmH20] 5.2 cmH20 Plateau Pressure:  [30 cmH20] 30 cmH20 INTAKE / OUTPUT: Intake/Output   None     PHYSICAL EXAMINATION: General: Intubated, awake, no apparent distress. Eyes: Anicteric sclerae. ENT: ETT in place. Trachea at midline.  Lymph: No cervical, supraclavicular, or axillary lymphadenopathy. Heart: Normal S1, S2. No murmurs, rubs, or gallops appreciated. No bruits, equal pulses. Lungs:  Good air movement bilaterally, without wheezes or crackles.  Abdomen: Abdomen soft, non-tender and not distended, normoactive bowel sounds. No hepatosplenomegaly or masses. Musculoskeletal: No clubbing or synovitis. LLE edema. Skin: No rashes or lesions Neuro: No focal neurologic deficits. Awake, oriented, following commands.  LABS:  Recent Labs Lab 01/17/13  PHART 7.409  PCO2ART 52.8*  PO2ART 126.0*    Recent Labs Lab 01/17/13 0038  GLUCAP 150*    CXR:  Will order.  ASSESSMENT / PLAN:  PULMONARY A: 1) Intubated, unclear indication (respiratory failure vs airway protection) 2) Questionable small PE 3) DVT 4) COPD 5) Moderate PAH per echo. P:   - Mechanical ventilation   - PRVC, Vt: 8cc/kg, PEEP: 5, RR: 16, FiO2: 100% and adjust to keep O2 sat > 94% - VAP order set - Scheduled and PRN albuterol - Daily awakening and  SBT - No anticoagulation given [facility] - May need IVC filter. - Will submit imaging studies for revision by radiology  CARDIOVASCULAR A:  1) DVT 2) Questionable PE 3) HTN P:  - No anticoagulation - Will hold oral antihypertensives for now.  RENAL A:   1) No history of CKD P:   - Will get CMP, Ca, phos and mg  GASTROINTESTINAL A:   1) No issues P:   - GI prophylaxis with Pepcid  HEMATOLOGIC A:   1) Unprovoked DVT and possibly PE P:  - Will get CBC - Anticoagulation contraindicated in the setting of SAH - Will get LE dopplers  INFECTIOUS A:   1) No evidence  of acute infection P:   - No antibiotics  ENDOCRINE A:   1) Uncontrolled DM P:   - Insulin drip  NEUROLOGIC A:   1) Small frontal SAH. PAtient awake, alert, oriented and non focal.  P:   - Will repeat CT of the head at 5:00 am or earlier if focal neurological signs - Intermittent sedation with versed and fentanyl. - Frequent neuro exams.   I have personally obtained a history, examined the patient, evaluated laboratory and imaging results, formulated the assessment and plan and placed orders. CRITICAL CARE:  Critical Care Time devoted to patient care services described in this note is 60 minutes.   Waynetta Pean, MD Pulmonary and [place] Pager: [phone]  01/17/2013, 1:01 AM

## 2013-01-17 NOTE — Procedures (Signed)
Extubation Procedure Note  Patient Details:   Name: Galina Orea DOB: Nov 13, 1945 MRN: [id number]   Pt self-extubated during change of shift.  Pt able to vocalize.  No SOB.  Pt placed on 4LNC and is tolerating well with Spo2 94%.  Will continue to monitor.      Evaluation  O2 sats: stable throughout Complications: No apparent complications Patient did tolerate procedure well. Bilateral Breath Sounds: Rhonchi Suctioning: Airway Yes  Maloni Musleh Apple 01/17/2013, 7:27 AM

## 2013-01-17 NOTE — Procedures (Signed)
Interventional Radiology Procedure Note  Procedure: Successful placement of potentially retrievable IVC filter in an infrarenal location.  Right IJ access.  Complications: None Recommendations: - Bedrest x 2 hrs with HOB elevated. - Bandage in place x 48 hrs, then remove.   Signed,  Criselda Peaches, MD Vascular & Interventional Radiologist [facility]iology

## 2013-01-17 NOTE — Progress Notes (Signed)
eLink Physician-Brief Progress Note Patient Name: Joslyn Colcord DOB: 1945/07/06 MRN: [id number]  Date of Service  01/17/2013   HPI/Events of Note   Notified by [name] RN that Doctor Phillips has called w cx data > anaerobic blood cx with [facility]  eICU Interventions  Will start vanco pending further data   Intervention Category Intermediate Interventions: Infection - evaluation and management  Teyanna Thielman S. 01/17/2013, 5:59 AM

## 2013-01-17 NOTE — Progress Notes (Signed)
ANTIBIOTIC CONSULT NOTE - INITIAL  Pharmacy Consult for Vancomycin Indication: bacteremia  Allergies  Allergen Reactions  . Penicillins Hives and Swelling    Patient Measurements: Height: 5\' 1"  (154.9 cm) Weight: 225 lb 8.5 oz (102.3 kg) IBW/kg (Calculated) : 47.8 Adjusted Body Weight: 70 kg  Vital Signs: Temp: 99.5 F (37.5 C) (07/28 0402) Temp src: Oral (07/28 0402) BP: 138/70 mmHg (07/28 0600) Pulse Rate: 70 (07/28 0600) Intake/Output from previous day: 07/27 0701 - 07/28 0700 In: 93.6 [I.V.:43.6; IV Piggyback:50] Out: 1000 [Urine:900; Emesis/NG output:100] Intake/Output from this shift: Total I/O In: 93.6 [I.V.:43.6; IV Piggyback:50] Out: 1000 [Urine:900; Emesis/NG output:100]  Labs:  Recent Labs  01/17/13 0130  WBC 17.5*  HGB 10.1*  PLT 184  CREATININE 0.80   Estimated Creatinine Clearance: 72.9 ml/min (by C-G formula based on Cr of 0.8). No results found for this basename: VANCOTROUGH, Corlis Leak, VANCORANDOM, Cylinder, GENTPEAK, GENTRANDOM, TOBRATROUGH, TOBRAPEAK, TOBRARND, AMIKACINPEAK, AMIKACINTROU, AMIKACIN,  in the last 72 hours   Microbiology: Recent Results (from the past 720 hour(s))  MRSA PCR SCREENING     Status: None   Collection Time    01/16/13 10:54 PM      Result Value Range Status   MRSA by PCR NEGATIVE  NEGATIVE Final   Comment:            [name] (FDA     approved for NASAL specimens     only), is one component of a     comprehensive MRSA colonization     surveillance program. It is not     intended to diagnose MRSA     infection nor to guide or     monitor treatment for     MRSA infections.    Medical History: Past Medical History  Diagnosis Date  . Thyroid disorder   . Diabetes mellitus     type II  . Thyroid nodule   . Sleep apnea   . Obesity   . Hypertension   . Hyperlipidemia   . Urinary incontinence   . CHF (congestive heart failure) 2005    Dx at [facility]  . Chronic back pain   . COPD (chronic  obstructive pulmonary disease)     2L [place]  . Sleep apnea   . Lipoma of back   . Arthritis     Medications:  Prescriptions prior to admission  Medication Sig Dispense Refill  . albuterol (PROVENTIL HFA;VENTOLIN HFA) 108 (90 BASE) MCG/ACT inhaler Inhale 2 puffs into the lungs as needed.      [name] amLODipine (NORVASC) 5 MG tablet Take 1 tablet (5 mg total) by mouth daily.  30 tablet  6  . aspirin 81 MG tablet Take 81 mg by mouth daily.      . chlorthalidone (HYGROTON) 25 MG tablet Take 1 tablet (25 mg total) by mouth daily.  30 tablet  3  . enalapril (VASOTEC) 20 MG tablet TAKE ONE TABLET BY MOUTH TWICE DAILY  60 tablet  0  . magnesium oxide (MAG-OX 400) 400 MG tablet Take 1 tablet (400 mg total) by mouth 2 (two) times daily.  60 tablet  3  . meloxicam (MOBIC) 7.5 MG tablet Take 7.5 mg by mouth as needed.       . metFORMIN (GLUMETZA) 500 MG (MOD) 24 hr tablet Take 1 tablet (500 mg total) by mouth 2 (two) times daily with a meal.  60 tablet  3  . NON FORMULARY Oxygen 2 liters.      [name]  NON FORMULARY CPAP       Assessment: 67 yo female with a blood culture from [facility] growing [facility] for empiric vancomycin  Goal of Therapy:  Vancomycin trough level 15-20 mcg/ml  Plan:  Vancomycin 1500 mg IV now, then 1 g IV q12h  [name] 01/17/2013,6:05 AM

## 2013-01-17 NOTE — Progress Notes (Signed)
eLink Physician-Brief Progress Note Patient Name: Debra Saunders DOB: 1945/10/11 MRN: [id number]  Date of Service  01/17/2013   HPI/Events of Note   Pain not relieved by toradol Costochondritis vs PE  eICU Interventions  Add tylenol Fentanyl for breakthrough   Intervention Category Minor Interventions: Routine modifications to care plan (e.g. PRN medications for pain, fever)  Antwian Santaana V. 01/17/2013, 3:55 PM

## 2013-01-17 NOTE — ED Notes (Signed)
Pt denies any new or increased pain.  Continues to complain of no cardiac related chest pain that she is unable to rate at this time

## 2013-01-17 NOTE — H&P (Addendum)
PULMONARY  / CRITICAL CARE MEDICINE  Name: Debra Saunders MRN: [id number] DOB: 06/26/1945    ADMISSION DATE:  01/16/2013  PRIMARY SERVICE: PCCM  CHIEF COMPLAINT:  SAH. Transferred from [facility].  BRIEF PATIENT DESCRIPTION:  67 years old female with PMH relevant for DM, HTN, diastolic CHF, PAH, COPD, OSA, obesity. Transferred intubated from [place] with DVT, questionable PE and Small frontal SAH after starting Xarelto.   SIGNIFICANT EVENTS / STUDIES:  - Small right SAH on head CT from [place].  LINES / TUBES: - Peripheral IV's  CULTURES: No cultures.   ANTIBIOTICS: No antibiotics  HISTORY OF PRESENT ILLNESS:   67 years old female with PMH relevant for DM, HTN, diastolic CHF, PAH, COPD, OSA, obesity. Admitted to [place] about 5 days ago with severe CP that was described more as musculoskeletal. LE dopplers were positive for DVT and a CTA showed a questionable small PE. She was started on Xarelto. Apparently today the patient decompensated acutely with altered mental status, bradycardia with questionable PEA arrest (there is no discharge summary from [place] and most of the records provided are useless) The patient was intubated and a CT scan of the head showed a small frontal SAH. The patient was transferred to [facility] for neurosurgical consultation. At the time of my examination the patient is intubated, awake, alert, oriented and following commands. No obvious focal neurological signs. The patient continues to complain of CP and and has pain from the ET tube. The patient is hemodynamically stable.  SUBJECTIVE: Intubated but arousable and follows command.  VITAL SIGNS: Temp:  [98.7 F (37.1 C)-99.8 F (37.7 C)] 99.8 F (37.7 C) (07/28 0734) Pulse Rate:  [62-90] 90 (07/28 0900) Resp:  [13-24] 24 (07/28 0900) BP: (131-174)/(68-90) 174/89 mmHg (07/28 0900) SpO2:  [94 %-99 %] 95 % (07/28 0900) FiO2 (%):  [39.8 %-40 %] 40 % (07/28 0700) Weight:  [101.1 kg (222 lb 14.2  oz)-102.3 kg (225 lb 8.5 oz)] 102.3 kg (225 lb 8.5 oz) (07/28 0402) HEMODYNAMICS:    VENTILATOR SETTINGS: Vent Mode:  [-] PRVC FiO2 (%):  [39.8 %-40 %] 40 % Set Rate:  [14 bmp] 14 bmp Vt Set:  [380 mL-500 mL] 500 mL PEEP:  [5 cmH20-5.2 cmH20] 5 cmH20 Plateau Pressure:  [22 cmH20-30 cmH20] 22 cmH20  INTAKE / OUTPUT: Intake/Output     07/27 0701 - 07/28 0700 07/28 0701 - 07/29 0700   I.V. (mL/kg) 66 (0.6) 22 (0.2)   IV Piggyback 550    Total Intake(mL/kg) 616 (6) 22 (0.2)   Urine (mL/kg/hr) 1150 1125 (3.5)   Emesis/NG output 100    Total Output 1250 1125   Net -634 -1103         PHYSICAL EXAMINATION: General: Extubated, awake, no apparent distress. Eyes: Anicteric sclerae. ENT: ETT in place. Trachea at midline.  Lymph: No cervical, supraclavicular, or axillary lymphadenopathy. Heart: Normal S1, S2. No murmurs, rubs, or gallops appreciated. No bruits, equal pulses.  Chest pain to palpation. Lungs:  Good air movement bilaterally, without wheezes or crackles.  Abdomen: Abdomen soft, non-tender and not distended, normoactive bowel sounds. No hepatosplenomegaly or masses. Musculoskeletal: No clubbing or synovitis. LLE edema. Skin: No rashes or lesions Neuro: No focal neurologic deficits. Awake, oriented, following commands.  LABS:  Recent Labs Lab 01/17/13 01/17/13 0130 01/17/13 0416  HGB  --  10.1*  --   WBC  --  17.5*  --   PLT  --  184  --   NA  --  131*  --  K  --  3.7  --   CL  --  91*  --   CO2  --  34*  --   GLUCOSE  --  141*  --   BUN  --  35*  --   CREATININE  --  0.80  --   CALCIUM  --  8.9  --   MG  --  2.6*  --   PHOS  --  3.3  --   AST  --  49*  --   ALT  --  74*  --   ALKPHOS  --  105  --   BILITOT  --  0.5  --   PROT  --  7.1  --   ALBUMIN  --  2.7*  --   APTT  --  28  --   INR  --  1.34  --   PHART 7.409  --  7.457*  PCO2ART 52.8*  --  49.4*  PO2ART 126.0*  --  98.2   Recent Labs Lab 01/17/13 0400 01/17/13 0506 01/17/13 0557  01/17/13 0659 01/17/13 0733  GLUCAP 136* 176* 184* 184* 172*   CXR:  Will order.  ASSESSMENT / PLAN:  PULMONARY A: VDRF post "code" with very shaky details, pulmonary edema, COPD and moderate PAH per echo. P:   - Extubated and doing well. - Titrate O2 for sat of 88-92%. - VAP order set, will add aztreonam until cultures result. - Scheduled and PRN albuterol. - Daily awakening and SBT. - No anticoagulation given SAH. - May need IVC filter if CT shows PE (DVT is negative preliminary read). - Will submit imaging studies for revision by radiology  CARDIOVASCULAR A:  1) DVT 2) Questionable PE 3) HTN P:  - No anticoagulation - Will begin coreg for BP and HR control. - Lasix.  RENAL A:   1) No history of CKD P:   - Will get CMP, Ca, phos and mg. - Lasix as ordered. - K replacement as ordered.  GASTROINTESTINAL A:   1) No issues P:   - GI prophylaxis with Pepcid. - Heart healthy diet.  HEMATOLOGIC A:   1) Unprovoked DVT and possibly PE P:  - Will get CBC. - Anticoagulation contraindicated in the setting of SAH. - Will get LE dopplers and radiology to review CTA, if all negative then no further interventions, if positive will need a filter.  INFECTIOUS A:   1) No evidence of acute infection but being treated for a VAP, not been intubated long enough to even consider VAP.  However, positive blood culture in [place] with GPC in anaerobic bottle. P:   - Will continue vanc. - F/U fever curve and WBC, if increases will consider adding aztreonam, continue current treatment for now. - Repeat BC.  ENDOCRINE A:   1) Uncontrolled DM P:   - ISS. - Will need diabetic educator prior to discharge.  NEUROLOGIC A:   1) Small frontal SAH. PAtient awake, alert, oriented and non focal.  P:   - Repeat CT in AM. - D/C all sedation. - Toradol and NSAID for BP control. - NS consult called. - Naproxen for costochondritis scheduled with protonix and after  meals.  Family updated in detail bedside.  I have personally obtained a history, examined the patient, evaluated laboratory and imaging results, formulated the assessment and plan and placed orders.  CRITICAL CARE: Critical Care Time devoted to patient care services described in this note is 35 minutes.   Archana Eckman G.  Nelda Marseille, M.D. [facility] Pulmonary/Critical Care Medicine. Pager: [phone]. After hours pager: [phone].

## 2013-01-17 NOTE — H&P (Signed)
Debra Saunders is an 67 y.o. female.   Chief Complaint: pt came to [facility] from [place] Intubated CHF exacerbation; COPD Found to have + DVT/PE and placed on Xeralto Developed SAH and transferred to [facility] Pt extubated self last night Off Xeralto now Scheduled for retrievable inferior vena cava filter placement HPI: DM; COPD; CHF; obese; sleep apnea; HTN; HLD BUN: 35 today  Past Medical History  Diagnosis Date  . Thyroid disorder   . Diabetes mellitus     type II  . Thyroid nodule   . Sleep apnea   . Obesity   . Hypertension   . Hyperlipidemia   . Urinary incontinence   . CHF (congestive heart failure) 2005    Dx at [facility]  . Chronic back pain   . COPD (chronic obstructive pulmonary disease)     2L [place]  . Sleep apnea   . Lipoma of back   . Arthritis     Past Surgical History  Procedure Laterality Date  . Cholecystectomy    . Vaginal hysterectomy    . Cardiac catheterization  2013    @ [place]: No obstructive CAD: Only 20% ostial left circumflex.  . Abdominal hysterectomy  1969  . Cyst removal trunk  2002    BACK  . Lipoma removal  2014    back    Family History  Problem Relation Age of Onset  . Heart disease Mother   . Hypertension Mother   . COPD Mother     was a smoker  . Thyroid disease Mother   . Hypertension Father   . Hypertension Sister   . Thyroid disease Sister   . Hypertension Sister   . Thyroid disease Sister   . Breast cancer Maternal Aunt    Social History:  reports that she quit smoking about 22 years ago. Her smoking use included Cigarettes. She has a .6 pack-year smoking history. She does not have any smokeless tobacco history on file. She reports that she does not drink alcohol or use illicit drugs.  Allergies:  Allergies  Allergen Reactions  . Penicillins Hives and Swelling    Medications Prior to Admission  Medication Sig Dispense Refill  . amLODipine (NORVASC) 5 MG tablet Take 1 tablet (5 mg total) by mouth daily.  30 tablet  6  .  aspirin 81 MG tablet Take 81 mg by mouth daily.      . chlorthalidone (HYGROTON) 25 MG tablet Take 1 tablet (25 mg total) by mouth daily.  30 tablet  3  . enalapril (VASOTEC) 20 MG tablet TAKE ONE TABLET BY MOUTH TWICE DAILY  60 tablet  0  . magnesium oxide (MAG-OX 400) 400 MG tablet Take 1 tablet (400 mg total) by mouth 2 (two) times daily.  60 tablet  3  . meloxicam (MOBIC) 7.5 MG tablet Take 7.5 mg by mouth as needed.       . metFORMIN (GLUMETZA) 500 MG (MOD) 24 hr tablet Take 1 tablet (500 mg total) by mouth 2 (two) times daily with a meal.  60 tablet  3  . NON FORMULARY Oxygen 2 liters.      . NON FORMULARY CPAP      . albuterol (PROVENTIL HFA;VENTOLIN HFA) 108 (90 BASE) MCG/ACT inhaler Inhale 2 puffs into the lungs as needed.        Results for orders placed during the hospital encounter of 01/16/13 (from the past 48 hour(s))  MRSA PCR SCREENING     Status: None   Collection  Time    01/16/13 10:54 PM      Result Value Range   MRSA by PCR NEGATIVE  NEGATIVE   Comment:            [name] (FDA     approved for NASAL specimens     only), is one component of a     comprehensive MRSA colonization     surveillance program. It is not     intended to diagnose MRSA     infection nor to guide or     monitor treatment for     MRSA infections.  GLUCOSE, CAPILLARY     Status: Abnormal   Collection Time    01/16/13 11:05 PM      Result Value Range   Glucose-Capillary 173 (*) 70 - 99 mg/dL   Comment 1 Notify RN     Comment 2 Documented in Chart    BLOOD GAS, ARTERIAL     Status: Abnormal   Collection Time    01/17/13 12:00 AM      Result Value Range   FIO2 0.40     Delivery systems VENTILATOR     Mode PRESSURE REGULATED VOLUME CONTROL     VT 380     Rate 16     Peep/cpap 5.0     pH, Arterial 7.409  7.350 - 7.450   pCO2 arterial 52.8 (*) 35.0 - 45.0 mmHg   pO2, Arterial 126.0 (*) 80.0 - 100.0 mmHg   Bicarbonate 32.7 (*) 20.0 - 24.0 mEq/L   TCO2 34.3  0 - 100  mmol/L   Acid-Base Excess 7.9 (*) 0.0 - 2.0 mmol/L   O2 Saturation 99.1     Patient temperature 98.6     Collection site LEFT RADIAL     Drawn by [id number]     Sample type ARTERIAL DRAW     Allens test (pass/fail) PASS  PASS  GLUCOSE, CAPILLARY     Status: Abnormal   Collection Time    01/17/13 12:38 AM      Result Value Range   Glucose-Capillary 150 (*) 70 - 99 mg/dL   Comment 1 Notify RN    COMPREHENSIVE METABOLIC PANEL     Status: Abnormal   Collection Time    01/17/13  1:30 AM      Result Value Range   Sodium 131 (*) 135 - 145 mEq/L   Potassium 3.7  3.5 - 5.1 mEq/L   Chloride 91 (*) 96 - 112 mEq/L   CO2 34 (*) 19 - 32 mEq/L   Glucose, Bld 141 (*) 70 - 99 mg/dL   BUN 35 (*) 6 - 23 mg/dL   Creatinine, Ser 0.80  0.50 - 1.10 mg/dL   Calcium 8.9  8.4 - 10.5 mg/dL   Total Protein 7.1  6.0 - 8.3 g/dL   Albumin 2.7 (*) 3.5 - 5.2 g/dL   AST 49 (*) 0 - 37 U/L   ALT 74 (*) 0 - 35 U/L   Alkaline Phosphatase 105  39 - 117 U/L   Total Bilirubin 0.5  0.3 - 1.2 mg/dL   GFR calc non Af Amer 74 (*) >90 mL/min   GFR calc Af Amer 85 (*) >90 mL/min   Comment:            The eGFR has been calculated     using the CKD EPI equation.     This calculation has not been     validated in all clinical  situations.     eGFR's persistently     <90 mL/min signify     possible Chronic Kidney Disease.  MAGNESIUM     Status: Abnormal   Collection Time    01/17/13  1:30 AM      Result Value Range   Magnesium 2.6 (*) 1.5 - 2.5 mg/dL  PHOSPHORUS     Status: None   Collection Time    01/17/13  1:30 AM      Result Value Range   Phosphorus 3.3  2.3 - 4.6 mg/dL  CBC     Status: Abnormal   Collection Time    01/17/13  1:30 AM      Result Value Range   WBC 17.5 (*) 4.0 - 10.5 K/uL   RBC 3.78 (*) 3.87 - 5.11 MIL/uL   Hemoglobin 10.1 (*) 12.0 - 15.0 g/dL   HCT 30.0 (*) 36.0 - 46.0 %   MCV 79.4  78.0 - 100.0 fL   MCH 26.7  26.0 - 34.0 pg   MCHC 33.7  30.0 - 36.0 g/dL   RDW 14.1  11.5 - 15.5 %    Platelets 184  150 - 400 K/uL  PROTIME-INR     Status: Abnormal   Collection Time    01/17/13  1:30 AM      Result Value Range   Prothrombin Time 16.3 (*) 11.6 - 15.2 seconds   INR 1.34  0.00 - 1.49  APTT     Status: None   Collection Time    01/17/13  1:30 AM      Result Value Range   aPTT 28  24 - 37 seconds  TYPE AND SCREEN     Status: None   Collection Time    01/17/13  1:30 AM      Result Value Range   ABO/RH(D) B POS     Antibody Screen NEG     Sample Expiration 01/20/2013    ABO/RH     Status: None   Collection Time    01/17/13  1:30 AM      Result Value Range   ABO/RH(D) B POS    GLUCOSE, CAPILLARY     Status: Abnormal   Collection Time    01/17/13  1:47 AM      Result Value Range   Glucose-Capillary 141 (*) 70 - 99 mg/dL  GLUCOSE, CAPILLARY     Status: Abnormal   Collection Time    01/17/13  2:55 AM      Result Value Range   Glucose-Capillary 161 (*) 70 - 99 mg/dL  GLUCOSE, CAPILLARY     Status: Abnormal   Collection Time    01/17/13  4:00 AM      Result Value Range   Glucose-Capillary 136 (*) 70 - 99 mg/dL  BLOOD GAS, ARTERIAL     Status: Abnormal   Collection Time    01/17/13  4:16 AM      Result Value Range   FIO2 0.40     Delivery systems VENTILATOR     Mode PRESSURE REGULATED VOLUME CONTROL     VT 500     Rate 16     Peep/cpap 5.0     pH, Arterial 7.457 (*) 7.350 - 7.450   pCO2 arterial 49.4 (*) 35.0 - 45.0 mmHg   pO2, Arterial 98.2  80.0 - 100.0 mmHg   Bicarbonate 34.4 (*) 20.0 - 24.0 mEq/L   TCO2 35.9  0 - 100 mmol/L   Acid-Base Excess 10.0 (*)  0.0 - 2.0 mmol/L   O2 Saturation 99.0     Patient temperature 98.6     Collection site LEFT RADIAL     Drawn by COLLECTED BY RT     Sample type ARTERIAL DRAW     Allens test (pass/fail) PASS  PASS  GLUCOSE, CAPILLARY     Status: Abnormal   Collection Time    01/17/13  5:06 AM      Result Value Range   Glucose-Capillary 176 (*) 70 - 99 mg/dL  GLUCOSE, CAPILLARY     Status: Abnormal   Collection  Time    01/17/13  5:57 AM      Result Value Range   Glucose-Capillary 184 (*) 70 - 99 mg/dL  GLUCOSE, CAPILLARY     Status: Abnormal   Collection Time    01/17/13  6:59 AM      Result Value Range   Glucose-Capillary 184 (*) 70 - 99 mg/dL  GLUCOSE, CAPILLARY     Status: Abnormal   Collection Time    01/17/13  7:33 AM      Result Value Range   Glucose-Capillary 172 (*) 70 - 99 mg/dL   Comment 1 Documented in Chart     Comment 2 Notify RN     Ct Head Wo Contrast  01/17/2013   *RADIOLOGY REPORT*  Clinical Data: Subarachnoid hemorrhage.  Follow-up head CT.  CT HEAD WITHOUT CONTRAST  Technique:  Contiguous axial images were obtained from the base of the skull through the vertex without contrast.  Comparison: 01/16/2013, 1651 hours, [facility]  Findings: Small area of subarachnoid hemorrhage is present in the right frontal lobe.  May be another tiny focus over the left frontal lobe.  There is no hydrocephalus.  There is no skull fracture.  Mastoid air cells and paranasal sinuses appear within normal limits.  No mass lesion, midline shift or infarct.  IMPRESSION: Small focus of right frontal subarachnoid hemorrhage with possible tiny focus of left frontal hemorrhage.  No hydrocephalus. Compared to 01/16/2013 at 1651 hours, the amount of subarachnoid hemorrhage is smaller.   Original Report Authenticated By: Dereck Ligas, M.D.   Dg Chest Port 1 View  01/17/2013   *RADIOLOGY REPORT*  Clinical Data: Endotracheal tube evaluation.  PORTABLE CHEST - 1 VIEW  Comparison: Chest x-ray from yesterday.  Findings: Endotracheal tube ends two centimeters above the carina. Gastric suction tube ends in the stomach.  Low volume lungs with persistent right lung perihilar opacities. No definite effusion or pneumothorax.  Unchanged appearance of the cardiomediastinal silhouette, distorted by low lung volumes and leftward rotation.  IMPRESSION:  1.  Improved positioning of endotracheal tube, now 2 cm above the  carina. 2.  Persistent right lung opacity, potentially pneumonia.   Original Report Authenticated By: Jorje Guild   Dg Chest Port 1 View  01/17/2013   *RADIOLOGY REPORT*  Clinical Data: Respiratory failure.  PORTABLE CHEST - 1 VIEW  Comparison: None  Findings: Endotracheal tube is present with the tip at the carina. A nasogastric tube extends into the stomach.  The lungs show low volumes with elevation of the right hemidiaphragm.  There is atelectasis versus infiltrate of the right lung. No pleural effusions are seen.  The heart size is within normal limits.  IMPRESSION: The endotracheal tube tip projects at the carina.  Lungs show low volumes with atelectasis versus infiltrate in the right lung.   Original Report Authenticated By: Aletta Edouard, M.D.    Review of Systems  Constitutional: Negative  for fever.  Respiratory: Positive for sputum production and shortness of breath.   Cardiovascular: Positive for chest pain.  Gastrointestinal: Negative for nausea and vomiting.  Neurological: Positive for weakness. Negative for headaches.    Blood pressure 162/75, pulse 76, temperature 99.8 F (37.7 C), temperature source Oral, resp. rate 21, height 5\' 1"  (1.549 m), weight 225 lb 8.5 oz (102.3 kg), SpO2 93.00%. Physical Exam  Cardiovascular: Normal rate and regular rhythm.   Murmur heard. Respiratory: Effort normal. She has wheezes.  GI: Soft. Bowel sounds are normal.  Musculoskeletal: Normal range of motion.  Moves all 4s  Neurological:  Confused; non verbal dtrs in room  Skin: Skin is warm.     Assessment/Plan COPD; CHF Extubated self last night Existing DVT/PE SAH while on Xeralto Now for IVC filter pts dtrs aware of procedure benefits and risks and agreeable to proceed Consent signed by dtr and in chart  Corran Lalone A 01/17/2013, 11:59 AM

## 2013-01-17 NOTE — Progress Notes (Signed)
Patient self extubated 0718 with safety mitts in place.  Patient 94% on 4L [place], no stridor noted, oriented, complaining of chest pain.  Dr. Joya Gaskins notified, no new orders received, will continue to monitor.

## 2013-01-17 NOTE — Progress Notes (Signed)
Bilateral lower extremity venous duplex:  No evidence of DVT, superficial thrombosis, or Baker's Cyst.   

## 2013-01-17 NOTE — Progress Notes (Signed)
I was notified by the [facility] at [facility]) that the pt's blood culture was positive for gram positive cocci in the anaerobic bottle.  MD (CCM) notified.  No new orders given at this time.  Will continue to monitor.

## 2013-01-17 NOTE — H&P (Signed)
Agree with PA note.  Recent increase in WBC discussed with Dr, Nelda Marseille.  May be stress demargination.  Ok to proceed with filter placement for PE prophylaxis.   Signed,  Criselda Peaches, MD [facility] Radiology

## 2013-01-17 NOTE — Progress Notes (Signed)
eLink Physician-Brief Progress Note Patient Name: Debra Saunders DOB: Dec 10, 1945 MRN: [id number]  Date of Service  01/17/2013   HPI/Events of Note   Has been on insulin gtt since transfer from [place], now meets criteria for transition off  eICU Interventions  Start ICU protocol phase 3   Intervention Category Intermediate Interventions: Hyperglycemia - evaluation and treatment  Orry Sigl S. 01/17/2013, 6:10 AM

## 2013-01-17 NOTE — Progress Notes (Signed)
Called by radiology, CTA unequivocally positive for PE, will ask for IVC filter placement.  Rush Farmer, M.D. [facility] Pulmonary/Critical Care Medicine. Pager: [phone]. After hours pager: [phone].

## 2013-01-17 NOTE — ED Notes (Signed)
Pt complains of non cardiac related chest pain ongoing.  She is unable to rate at this time.

## 2013-01-18 ENCOUNTER — Inpatient Hospital Stay (HOSPITAL_COMMUNITY): Payer: Medicare Other

## 2013-01-18 ENCOUNTER — Encounter (HOSPITAL_COMMUNITY): Payer: Self-pay | Admitting: Radiology

## 2013-01-18 DIAGNOSIS — D649 Anemia, unspecified: Secondary | ICD-10-CM

## 2013-01-18 DIAGNOSIS — R5381 Other malaise: Secondary | ICD-10-CM

## 2013-01-18 LAB — CBC
HCT: 32.9 % — ABNORMAL LOW (ref 36.0–46.0)
Hemoglobin: 10.8 g/dL — ABNORMAL LOW (ref 12.0–15.0)
MCH: 26.4 pg (ref 26.0–34.0)
MCHC: 32.8 g/dL (ref 30.0–36.0)
MCV: 80.4 fL (ref 78.0–100.0)
Platelets: 209 10*3/uL (ref 150–400)
RBC: 4.09 MIL/uL (ref 3.87–5.11)
RDW: 14.5 % (ref 11.5–15.5)
WBC: 16 10*3/uL — ABNORMAL HIGH (ref 4.0–10.5)

## 2013-01-18 LAB — GLUCOSE, CAPILLARY
Glucose-Capillary: 141 mg/dL — ABNORMAL HIGH (ref 70–99)
Glucose-Capillary: 143 mg/dL — ABNORMAL HIGH (ref 70–99)
Glucose-Capillary: 162 mg/dL — ABNORMAL HIGH (ref 70–99)
Glucose-Capillary: 199 mg/dL — ABNORMAL HIGH (ref 70–99)
Glucose-Capillary: 239 mg/dL — ABNORMAL HIGH (ref 70–99)
Glucose-Capillary: 251 mg/dL — ABNORMAL HIGH (ref 70–99)

## 2013-01-18 LAB — BASIC METABOLIC PANEL
BUN: 30 mg/dL — ABNORMAL HIGH (ref 6–23)
CO2: 33 mEq/L — ABNORMAL HIGH (ref 19–32)
Calcium: 9.2 mg/dL (ref 8.4–10.5)
Chloride: 92 mEq/L — ABNORMAL LOW (ref 96–112)
Creatinine, Ser: 0.84 mg/dL (ref 0.50–1.10)
GFR calc Af Amer: 80 mL/min — ABNORMAL LOW (ref >90)
GFR calc non Af Amer: 69 mL/min — ABNORMAL LOW (ref >90)
Glucose, Bld: 139 mg/dL — ABNORMAL HIGH (ref 70–99)
Potassium: 3.4 mEq/L — ABNORMAL LOW (ref 3.5–5.1)
Sodium: 134 mEq/L — ABNORMAL LOW (ref 135–145)

## 2013-01-18 LAB — MAGNESIUM: Magnesium: 2.4 mg/dL (ref 1.5–2.5)

## 2013-01-18 LAB — PHOSPHORUS: Phosphorus: 3.9 mg/dL (ref 2.3–4.6)

## 2013-01-18 MED ORDER — FUROSEMIDE 10 MG/ML IJ SOLN
20.0000 mg | Freq: Four times a day (QID) | INTRAMUSCULAR | Status: AC
  Administered 2013-01-18 (×2): 20 mg via INTRAVENOUS
  Filled 2013-01-18: qty 2

## 2013-01-18 MED ORDER — FAMOTIDINE 20 MG PO TABS
20.0000 mg | ORAL_TABLET | Freq: Two times a day (BID) | ORAL | Status: DC
  Administered 2013-01-18 – 2013-01-27 (×18): 20 mg via ORAL
  Filled 2013-01-18 (×24): qty 1

## 2013-01-18 MED ORDER — FUROSEMIDE 10 MG/ML IJ SOLN
INTRAMUSCULAR | Status: AC
  Administered 2013-01-18: 10 mg
  Filled 2013-01-18: qty 4

## 2013-01-18 MED ORDER — INSULIN ASPART 100 UNIT/ML ~~LOC~~ SOLN
0.0000 [IU] | SUBCUTANEOUS | Status: DC
  Administered 2013-01-18: 7 [IU] via SUBCUTANEOUS
  Administered 2013-01-18: 11 [IU] via SUBCUTANEOUS
  Administered 2013-01-19 (×2): 4 [IU] via SUBCUTANEOUS
  Administered 2013-01-19: 3 [IU] via SUBCUTANEOUS
  Administered 2013-01-19: 7 [IU] via SUBCUTANEOUS

## 2013-01-18 MED ORDER — FUROSEMIDE 10 MG/ML IJ SOLN
INTRAMUSCULAR | Status: AC
  Administered 2013-01-18: 20 mg via INTRAVENOUS
  Filled 2013-01-18: qty 4

## 2013-01-18 MED ORDER — FENTANYL CITRATE 0.05 MG/ML IJ SOLN
50.0000 ug | Freq: Once | INTRAMUSCULAR | Status: AC
  Administered 2013-01-18: 50 ug via INTRAVENOUS

## 2013-01-18 MED ORDER — POTASSIUM CHLORIDE CRYS ER 20 MEQ PO TBCR
40.0000 meq | EXTENDED_RELEASE_TABLET | Freq: Three times a day (TID) | ORAL | Status: AC
  Administered 2013-01-18 (×2): 40 meq via ORAL
  Filled 2013-01-18 (×2): qty 2

## 2013-01-18 NOTE — Progress Notes (Signed)
eLink Physician-Brief Progress Note Patient Name: Debra Saunders DOB: February 16, 1946 MRN: [id number]  Date of Service  01/18/2013   HPI/Events of Note   Severe pain below both breasts  While sitting in commode. Fentanyl 34mc g 30 min ago did not help Pain score is 12/10  eICU Interventions  Fentanyl 20mcg IV x 1 and reassess   Intervention Category Intermediate Interventions: Pain - evaluation and management  Nester Bachus 01/18/2013, 5:39 PM

## 2013-01-18 NOTE — Progress Notes (Signed)
PT working with pt.; pt c/o of severe sharp pain below both breast, pt just rec'd pain med 4min prior new order rec'd. Returned pt to bed. Pt is subsiding., will continue to monitor. Suezanne Cheshire

## 2013-01-18 NOTE — Evaluation (Signed)
Physical Therapy Evaluation Patient Details Name: Debra Saunders MRN: [id number] DOB: 1945-08-21 Today's Date: 01/18/2013 Time: 1720-1750 PT Time Calculation (min): 30 min  PT Assessment / Plan / Recommendation  History of Present Illness  67 years old female with PMH relevant for DM, HTN, diastolic CHF, PAH, COPD, OSA, obesity. Transferred intubated from [place] with DVT, questionable PE and Small frontal SAH after starting Xarelto.    Clinical Impression  Pt admitted with a small SAH, DVT, filter placed.  Pt has also had CP (sternal to bil costal at the breast line).   Pt currently with functional limitations due to the deficits listed below (see PT Problem List).  Pt will benefit from skilled PT to increase their independence and safety with mobility to allow discharge to the venue listed below.      PT Assessment  Patient needs continued PT services    Follow Up Recommendations  SNF;Other (comment) (unless she becomes Indep. or has 24 hour asssit)    Does the patient have the potential to tolerate intense rehabilitation      Barriers to Discharge Decreased caregiver support      Equipment Recommendations  None recommended by PT    Recommendations for Other Services     Frequency Min 3X/week    Precautions / Restrictions Precautions Precautions: Fall Restrictions Weight Bearing Restrictions: No   Pertinent Vitals/Pain ">10/10 chest pain on both sides at my ribs"      Mobility  Bed Mobility Bed Mobility: Supine to Sit;Sitting - Scoot to [employer] of Bed;Sit to Supine Supine to Sit: 4: Min assist Sitting - Scoot to Edge of Bed: 4: Min guard Sit to Supine: 4: Min assist Details for Bed Mobility Assistance: minor assist due to chest pain.  use of rail Transfers Transfers: Sit to Stand;Stand to [name] Transfers Sit to Stand: 4: Min assist;With upper extremity assist;From bed Stand to Sit: 4: Min assist;With upper extremity assist;To chair/3-in-1 Stand Pivot  Transfers: 4: Min assist Details for Transfer Assistance: heavy use of arms Ambulation/Gait Ambulation/Gait Assistance: Not tested (comment);Other (comment) (due to chest pain) Stairs: No Wheelchair Mobility Wheelchair Mobility: No    Exercises     PT Diagnosis: Acute pain;Generalized weakness  PT Problem List: Decreased strength;Decreased mobility;Decreased activity tolerance;Pain;Decreased knowledge of use of DME PT Treatment Interventions: DME instruction;Gait training;Functional mobility training;Stair training;Therapeutic activities;Patient/family education     PT Goals(Current goals can be found in the care plan section) Acute Rehab PT Goals Patient Stated Goal: Get rid of this pain. PT Goal Formulation: With patient Time For Goal Achievement: 02/01/13 Potential to Achieve Goals: Good  Visit Information  Last PT Received On: 01/18/13 Assistance Needed: +1 History of Present Illness: 67 years old female with PMH relevant for DM, HTN, diastolic CHF, PAH, COPD, OSA, obesity. Transferred intubated from [place] with DVT, questionable PE and Small frontal SAH after starting Xarelto.         Prior [place] expects to be discharged to:: Private residence Living Arrangements: Children Available Help at Discharge: Available PRN/intermittently Type of Home: House Home Access: Stairs to enter Technical brewer of Steps: 5 Entrance Stairs-Rails:  (broken rails) Home Layout: One level Home Equipment: Environmental consultant - 2 wheels Prior Function Level of Independence: Independent Communication Communication: No difficulties    Cognition  Cognition Arousal/Alertness: Awake/alert Behavior During Therapy: WFL for tasks assessed/performed Overall Cognitive Status: Within Functional Limits for tasks assessed    Extremity/Trunk Assessment Upper Extremity Assessment Upper Extremity Assessment: Overall WFL for tasks  assessed Lower Extremity  Assessment Lower Extremity Assessment: Overall WFL for tasks assessed;Generalized weakness Cervical / Trunk Assessment Cervical / Trunk Assessment: Normal   Balance Balance Balance Assessed: Yes Static Sitting Balance Static Sitting - Balance Support: Feet supported;No upper extremity supported Static Sitting - Level of Assistance: 7: Independent  End of Session PT - End of Session Activity Tolerance: Patient limited by pain Patient left: in bed;with call bell/phone within reach;with nursing/sitter in room Nurse Communication: Mobility status  GP     Stacyann Mcconaughy, Tessie Fass 01/18/2013, 6:11 PM 01/18/2013  Donnella Sham, [place] [phone]  (pager)

## 2013-01-18 NOTE — Progress Notes (Signed)
PULMONARY  / CRITICAL CARE MEDICINE  Name: Carlisle Timothy MRN: [id number] DOB: January 19, 1946    ADMISSION DATE:  01/16/2013  PRIMARY SERVICE: PCCM  CHIEF COMPLAINT:  SAH. Transferred from [facility].  BRIEF PATIENT DESCRIPTION:  67 years old female with PMH relevant for DM, HTN, diastolic CHF, PAH, COPD, OSA, obesity. Transferred intubated from [place] with DVT, questionable PE and Small frontal SAH after starting Xarelto.   SIGNIFICANT EVENTS / STUDIES:  - Small right SAH on head CT from [place].  LINES / TUBES: - Peripheral IV's  CULTURES: No cultures.   ANTIBIOTICS: No antibiotics  HISTORY OF PRESENT ILLNESS:   67 years old female with PMH relevant for DM, HTN, diastolic CHF, PAH, COPD, OSA, obesity. Admitted to [place] about 5 days ago with severe CP that was described more as musculoskeletal. LE dopplers were positive for DVT and a CTA showed a questionable small PE. She was started on Xarelto. Apparently today the patient decompensated acutely with altered mental status, bradycardia with questionable PEA arrest (there is no discharge summary from [place] and most of the records provided are useless) The patient was intubated and a CT scan of the head showed a small frontal SAH. The patient was transferred to [facility] for neurosurgical consultation. At the time of my examination the patient is intubated, awake, alert, oriented and following commands. No obvious focal neurological signs. The patient continues to complain of CP and and has pain from the ET tube. The patient is hemodynamically stable.  SUBJECTIVE: Extubated, NAD.  VITAL SIGNS: Temp:  [98 F (36.7 C)-100.6 F (38.1 C)] 98.3 F (36.8 C) (07/29 0804) Pulse Rate:  [50-102] 86 (07/29 1000) Resp:  [13-24] 19 (07/29 1000) BP: (96-176)/(32-100) 113/68 mmHg (07/29 1000) SpO2:  [94 %-99 %] 97 % (07/29 1000) Weight:  [97 kg (213 lb 13.5 oz)] 97 kg (213 lb 13.5 oz) (07/29 0400) HEMODYNAMICS:    VENTILATOR SETTINGS:    INTAKE / OUTPUT: Intake/Output     07/28 0701 - 07/29 0700 07/29 0701 - 07/30 0700   P.O.  120   I.V. (mL/kg) 22 (0.2)    IV Piggyback 500 50   Total Intake(mL/kg) 522 (5.4) 170 (1.8)   Urine (mL/kg/hr) 3220 (1.4) 135 (0.4)   Emesis/NG output     Total Output 3220 135   Net -2698 +35         PHYSICAL EXAMINATION: General: Extubated, awake, no apparent distress. Eyes: Anicteric sclerae. ENT: Trachea at midline.  Lymph: No cervical, supraclavicular, or axillary lymphadenopathy. Heart: Normal S1, S2. No murmurs, rubs, or gallops appreciated. No bruits, equal pulses.  Chest pain to palpation. Lungs:  Good air movement bilaterally, without wheezes or crackles.  Abdomen: Abdomen soft, non-tender and not distended, normoactive bowel sounds. No hepatosplenomegaly or masses. Musculoskeletal: No clubbing or synovitis. LLE edema. Skin: No rashes or lesions Neuro: No focal neurologic deficits. Awake, oriented, following commands.  LABS:  Recent Labs Lab 01/17/13 01/17/13 0130 01/17/13 0416 01/18/13 0345  HGB  --  10.1*  --  10.8*  WBC  --  17.5*  --  16.0*  PLT  --  184  --  209  NA  --  131*  --  134*  K  --  3.7  --  3.4*  CL  --  91*  --  92*  CO2  --  34*  --  33*  GLUCOSE  --  141*  --  139*  BUN  --  35*  --  30*  CREATININE  --  0.80  --  0.84  CALCIUM  --  8.9  --  9.2  MG  --  2.6*  --  2.4  PHOS  --  3.3  --  3.9  AST  --  49*  --   --   ALT  --  74*  --   --   ALKPHOS  --  105  --   --   BILITOT  --  0.5  --   --   PROT  --  7.1  --   --   ALBUMIN  --  2.7*  --   --   APTT  --  28  --   --   INR  --  1.34  --   --   PHART 7.409  --  7.457*  --   PCO2ART 52.8*  --  49.4*  --   PO2ART 126.0*  --  98.2  --    Recent Labs Lab 01/17/13 1542 01/17/13 2000 01/18/13 0009 01/18/13 0401 01/18/13 0803  GLUCAP 191* 213* 141* 143* 162*   CXR: noted  ASSESSMENT / PLAN:  PULMONARY A: VDRF post "code" with very shaky details, pulmonary edema, COPD and moderate  PAH per echo. P:   - Extubated and doing well, titrate O2. - Titrate O2 for sat of 88-92%. - Scheduled and PRN albuterol. - No anticoagulation given SAH. - IVC filter in place. - IS and flutter valve.  CARDIOVASCULAR A:  1) DVT 2) Questionable PE 3) HTN P:  - No anticoagulation - Will begin coreg for BP and HR control. - Lasix as ordered.  RENAL A:   1) No history of CKD P:   - AM labs ordered. - Lasix as ordered. - K replacement as ordered.  GASTROINTESTINAL A:   1) No issues P:   - GI prophylaxis with Pepcid. - Heart healthy diet.  HEMATOLOGIC A:   1) Unprovoked DVT and possibly PE P:  - Will get CBC. - Anticoagulation contraindicated in the setting of SAH. - Lower ext doppler negative for DVT, CT reviewed positive for PE, filter in place.  INFECTIOUS A:   1) No evidence of acute infection but being treated for a VAP, not been intubated long enough to even consider VAP.  However, positive blood culture in [place] with GPC in anaerobic bottle. P:   - Will continue vanc until cultures are negative from [facility], if negative would d/c. - If further signs of infection present will likely need aztreonam added. - Repeat BC.  ENDOCRINE A:   1) Uncontrolled DM P:   - ISS. - Will need diabetic educator prior to discharge.  NEUROLOGIC A:   1) Small frontal SAH. PAtient awake, alert, oriented and non focal.   Head CT from AM noted and improved. P:   - Repeat CT in AM. - D/C all sedation. - Toradol and NSAID for BP control. - NS consult called. - Naproxen for costochondritis scheduled with protonix and after meals. - Spoke with Dr. Trenton Gammon yesterday about patient, he will likely see today.  Family updated in detail bedside.  Patient ready for transfer to SDU, needs rehab and more time with naproxen for costochondritis, would not keep on indefinitely.  Will transfer to SDU and to [facility], PCCM will sign off, please call back if needed.  I have personally obtained a  history, examined the patient, evaluated laboratory and imaging results, formulated the assessment and plan and placed orders.  Rush Farmer,  M.D. [facility] Pulmonary/Critical Care Medicine. Pager: [phone]. After hours pager: [phone].

## 2013-01-18 NOTE — Progress Notes (Signed)
Utilization review completed.  

## 2013-01-18 NOTE — Progress Notes (Signed)
[facility] ADULT ICU REPLACEMENT PROTOCOL FOR AM LAB REPLACEMENT ONLY  The patient does not apply for the [facility] Adult ICU Electrolyte Replacment Protocol based on the criteria listed below:   1. Is urine output >/= 0.5 ml/kg/hr for the last 6 hours? no Patient's UOP is .26 ml/kg/hr 2. Abnormal electrolyte(s): K-3.4   George Ina 01/18/2013 5:17 AM

## 2013-01-19 ENCOUNTER — Inpatient Hospital Stay (HOSPITAL_COMMUNITY): Payer: Medicare Other

## 2013-01-19 DIAGNOSIS — I609 Nontraumatic subarachnoid hemorrhage, unspecified: Secondary | ICD-10-CM

## 2013-01-19 DIAGNOSIS — R569 Unspecified convulsions: Secondary | ICD-10-CM

## 2013-01-19 DIAGNOSIS — I10 Essential (primary) hypertension: Secondary | ICD-10-CM

## 2013-01-19 DIAGNOSIS — M940 Chondrocostal junction syndrome [Tietze]: Secondary | ICD-10-CM | POA: Diagnosis present

## 2013-01-19 DIAGNOSIS — R079 Chest pain, unspecified: Secondary | ICD-10-CM | POA: Diagnosis present

## 2013-01-19 DIAGNOSIS — I2699 Other pulmonary embolism without acute cor pulmonale: Secondary | ICD-10-CM | POA: Diagnosis present

## 2013-01-19 DIAGNOSIS — R0789 Other chest pain: Secondary | ICD-10-CM | POA: Diagnosis present

## 2013-01-19 HISTORY — DX: Nontraumatic subarachnoid hemorrhage, unspecified: I60.9

## 2013-01-19 LAB — GLUCOSE, CAPILLARY
Glucose-Capillary: 100 mg/dL — ABNORMAL HIGH (ref 70–99)
Glucose-Capillary: 92 mg/dL (ref 70–99)
Glucose-Capillary: 150 mg/dL — ABNORMAL HIGH (ref 70–99)
Glucose-Capillary: 163 mg/dL — ABNORMAL HIGH (ref 70–99)
Glucose-Capillary: 196 mg/dL — ABNORMAL HIGH (ref 70–99)
Glucose-Capillary: 249 mg/dL — ABNORMAL HIGH (ref 70–99)
Glucose-Capillary: 243 mg/dL — ABNORMAL HIGH (ref 70–99)
Glucose-Capillary: 226 mg/dL — ABNORMAL HIGH (ref 70–99)
Glucose-Capillary: 268 mg/dL — ABNORMAL HIGH (ref 70–99)
Glucose-Capillary: 219 mg/dL — ABNORMAL HIGH (ref 70–99)

## 2013-01-19 LAB — BASIC METABOLIC PANEL
BUN: 28 mg/dL — ABNORMAL HIGH (ref 6–23)
CO2: 31 mEq/L (ref 19–32)
Calcium: 9.1 mg/dL (ref 8.4–10.5)
Chloride: 92 mEq/L — ABNORMAL LOW (ref 96–112)
Creatinine, Ser: 0.77 mg/dL (ref 0.50–1.10)
GFR calc Af Amer: >90 mL/min (ref >90)
GFR calc non Af Amer: 84 mL/min — ABNORMAL LOW (ref >90)
Glucose, Bld: 155 mg/dL — ABNORMAL HIGH (ref 70–99)
Potassium: 3.9 mEq/L (ref 3.5–5.1)
Sodium: 134 mEq/L — ABNORMAL LOW (ref 135–145)

## 2013-01-19 LAB — CBC
HCT: 32.5 % — ABNORMAL LOW (ref 36.0–46.0)
Hemoglobin: 10.5 g/dL — ABNORMAL LOW (ref 12.0–15.0)
MCH: 26.3 pg (ref 26.0–34.0)
MCHC: 32.3 g/dL (ref 30.0–36.0)
MCV: 81.3 fL (ref 78.0–100.0)
Platelets: 234 10*3/uL (ref 150–400)
RBC: 4 MIL/uL (ref 3.87–5.11)
RDW: 14.6 % (ref 11.5–15.5)
WBC: 16.4 10*3/uL — ABNORMAL HIGH (ref 4.0–10.5)

## 2013-01-19 LAB — MAGNESIUM: Magnesium: 2 mg/dL (ref 1.5–2.5)

## 2013-01-19 LAB — PHOSPHORUS: Phosphorus: 4.4 mg/dL (ref 2.3–4.6)

## 2013-01-19 LAB — CULTURE, BLOOD (SINGLE)

## 2013-01-19 MED ORDER — INSULIN ASPART 100 UNIT/ML ~~LOC~~ SOLN
0.0000 [IU] | Freq: Three times a day (TID) | SUBCUTANEOUS | Status: DC
  Administered 2013-01-20: 11 [IU] via SUBCUTANEOUS
  Administered 2013-01-20 (×2): 4 [IU] via SUBCUTANEOUS
  Administered 2013-01-21: 3 [IU] via SUBCUTANEOUS
  Administered 2013-01-21: 11 [IU] via SUBCUTANEOUS
  Administered 2013-01-21: 4 [IU] via SUBCUTANEOUS
  Administered 2013-01-22 (×2): 7 [IU] via SUBCUTANEOUS
  Administered 2013-01-22 – 2013-01-23 (×2): 4 [IU] via SUBCUTANEOUS
  Administered 2013-01-23: 7 [IU] via SUBCUTANEOUS
  Administered 2013-01-23: 3 [IU] via SUBCUTANEOUS
  Administered 2013-01-24: 4 [IU] via SUBCUTANEOUS
  Administered 2013-01-24: 09:00:00 via SUBCUTANEOUS
  Administered 2013-01-25: 4 [IU] via SUBCUTANEOUS
  Administered 2013-01-25: 3 [IU] via SUBCUTANEOUS
  Administered 2013-01-25: 4 [IU] via SUBCUTANEOUS
  Administered 2013-01-26 (×2): 3 [IU] via SUBCUTANEOUS
  Administered 2013-01-27: 7 [IU] via SUBCUTANEOUS
  Administered 2013-01-27: 3 [IU] via SUBCUTANEOUS

## 2013-01-19 MED ORDER — TRAMADOL HCL 50 MG PO TABS
100.0000 mg | ORAL_TABLET | Freq: Four times a day (QID) | ORAL | Status: DC | PRN
  Administered 2013-01-19: 100 mg via ORAL
  Filled 2013-01-19: qty 2

## 2013-01-19 MED ORDER — GI COCKTAIL ~~LOC~~
30.0000 mL | Freq: Once | ORAL | Status: AC
  Administered 2013-01-19: 30 mL via ORAL
  Filled 2013-01-19: qty 30

## 2013-01-19 MED ORDER — TROLAMINE SALICYLATE 10 % EX CREA: TOPICAL_CREAM | Freq: Three times a day (TID) | CUTANEOUS | Status: DC

## 2013-01-19 MED ORDER — GI COCKTAIL ~~LOC~~
30.0000 mL | Freq: Three times a day (TID) | ORAL | Status: DC | PRN
  Administered 2013-01-19 – 2013-01-26 (×12): 30 mL via ORAL
  Filled 2013-01-19 (×14): qty 30

## 2013-01-19 MED ORDER — FAMOTIDINE 20 MG PO TABS: 20.0000 mg | ORAL_TABLET | Freq: Two times a day (BID) | ORAL | Status: DC

## 2013-01-19 MED ORDER — MUSCLE RUB 10-15 % EX CREA
TOPICAL_CREAM | Freq: Three times a day (TID) | CUTANEOUS | Status: DC
  Administered 2013-01-19: 1 via TOPICAL
  Administered 2013-01-19 – 2013-01-22 (×4): via TOPICAL
  Administered 2013-01-22: 1 via TOPICAL
  Administered 2013-01-22: 09:00:00 via TOPICAL
  Administered 2013-01-22: 1 via TOPICAL
  Administered 2013-01-23 – 2013-01-24 (×4): via TOPICAL
  Administered 2013-01-24 – 2013-01-25 (×2): 1 via TOPICAL
  Administered 2013-01-25: 17:00:00 via TOPICAL
  Administered 2013-01-26: 1 via TOPICAL
  Administered 2013-01-27: 16:00:00 via TOPICAL
  Filled 2013-01-19 (×3): qty 85

## 2013-01-19 MED ORDER — PANTOPRAZOLE SODIUM 40 MG PO TBEC
40.0000 mg | DELAYED_RELEASE_TABLET | Freq: Every day | ORAL | Status: DC
  Administered 2013-01-19 – 2013-01-27 (×9): 40 mg via ORAL
  Filled 2013-01-19 (×10): qty 1

## 2013-01-19 MED ORDER — ALBUTEROL SULFATE (5 MG/ML) 0.5% IN NEBU: 2.5000 mg | INHALATION_SOLUTION | Freq: Four times a day (QID) | RESPIRATORY_TRACT | Status: DC | PRN

## 2013-01-19 NOTE — Progress Notes (Signed)
Inpatient Diabetes Program Recommendations  AACE/ADA: New Consensus Statement on Inpatient Glycemic Control (2013)  Target Ranges:  Prepandial:   less than 140 mg/dL      Peak postprandial:   less than 180 mg/dL (1-2 hours)      Critically ill patients:  140 - 180 mg/dL     Results for CAYSIE, KOLIS (MRN [id number]) as of 01/19/2013 13:08  Ref. Range 01/18/2013 08:03 01/18/2013 12:40 01/18/2013 16:20 01/18/2013 19:28  Glucose-Capillary Latest Range: 70-99 mg/dL 162 (H) 199 (H) 251 (H) 239 (H)    Recommend the following insulin adjustments:  1. Decrease Novolog SSI to Moderate scale tid ac + HS 2. Add scheduled Novolog meal coverage- Novolog 4 units tid with meals   Will follow. Wyn Quaker RN, MSN, CDE Diabetes Coordinator Inpatient Diabetes Program [phone]

## 2013-01-19 NOTE — Consult Note (Signed)
NEURO HOSPITALIST CONSULT NOTE    Reason for Consult: seizure  HPI:                                                                                                                                          Debra Saunders is an 67 y.o. female who was brought to [facility] due to chest pain. Her chest pain at the time was thought to be secondary to costochondritis, However follow up CT chest was performed stated it could not  exclude PE and patient was placed on Xarelto.  On 7/27 patient suffered PEA arrest.  After 30 seconds of chest compressions she began to wake up but was noted to be confused with slurred speech and was not able to protect her airway and therefore was intubated. intitial CT head  On 7/28 showed small focus of right frontal subarachnoid hemorrhage with possible tiny focus of left frontal hemorrhage. Follow up CT head on 7/29 showed evolution of right frontal SAH.  Alen Blew was D/c'd and patient has had a IVC filter placed due to CT of chest (performed at [facility]) read as positive for PE. Today patient was heard to call out --when nurse entered room patient was noted to have bilateral jerking with eyes deviated upward and to the RIGHT. This lasted for only seconds with no post ictal confusion.  Patient states she was trying to move her LEFT arm but had no control over the arm.  Although she states she did not lose consciousness, she cannot give me details of events after yelling out.  Currently she is complaining of mild HA and some musculoskeletal chest discomfort with arm movements. Tramadol has been discontinued after only receiving one dose at 1425.   --WBC 16.4 with preliminary BC negative for growth.    Past Medical History  Diagnosis Date  . Thyroid disorder   . Diabetes mellitus     type II  . Thyroid nodule   . Sleep apnea   . Obesity   . Hypertension   . Hyperlipidemia   . Urinary incontinence   . CHF (congestive heart failure)  2005    Dx at [facility]  . Chronic back pain   . COPD (chronic obstructive pulmonary disease)     2L [place]  . Sleep apnea   . Lipoma of back   . Arthritis     Past Surgical History  Procedure Laterality Date  . Cholecystectomy    . Vaginal hysterectomy    . Cardiac catheterization  2013    @ [place]: No obstructive CAD: Only 20% ostial left circumflex.  . Abdominal hysterectomy  1969  . Cyst removal trunk  2002    BACK  . Lipoma removal  2014    back    Family  History  Problem Relation Age of Onset  . Heart disease Mother   . Hypertension Mother   . COPD Mother     was a smoker  . Thyroid disease Mother   . Hypertension Father   . Hypertension Sister   . Thyroid disease Sister   . Hypertension Sister   . Thyroid disease Sister   . Breast cancer Maternal Aunt      Social History:  reports that she quit smoking about 22 years ago. Her smoking use included Cigarettes. She has a .6 pack-year smoking history. She does not have any smokeless tobacco history on file. She reports that she does not drink alcohol or use illicit drugs.  Allergies  Allergen Reactions  . Penicillins Hives and Swelling    MEDICATIONS:                                                                                                                     Scheduled: . antiseptic oral rinse  15 mL Mouth Rinse QID  . carvedilol  3.125 mg Oral BID WC  . chlorhexidine  15 mL Mouth Rinse BID  . famotidine  20 mg Oral BID  . insulin aspart  0-20 Units Subcutaneous Q4H  . MUSCLE RUB   Topical TID  . pantoprazole  40 mg Oral Daily     ROS:                                                                                                                                       History obtained from the patient  General ROS: negative for - chills, fatigue, fever, night sweats, weight gain or weight loss Psychological ROS: negative for - behavioral disorder, hallucinations, memory difficulties, mood swings or suicidal  ideation Ophthalmic ROS: negative for - blurry vision, double vision, eye pain or loss of vision ENT ROS: negative for - epistaxis, nasal discharge, oral lesions, sore throat, tinnitus or vertigo Allergy and Immunology ROS: negative for - hives or itchy/watery eyes Hematological and Lymphatic ROS: negative for - bleeding problems, bruising or swollen lymph nodes Endocrine ROS: negative for - galactorrhea, hair pattern changes, polydipsia/polyuria or temperature intolerance Respiratory ROS: negative for - cough, hemoptysis, shortness of breath or wheezing Cardiovascular ROS: negative for - chest pain, dyspnea on exertion, edema or irregular heartbeat Gastrointestinal ROS: negative for - abdominal pain, diarrhea, hematemesis, nausea/vomiting or stool incontinence Genito-Urinary ROS: negative for -  dysuria, hematuria, incontinence or urinary frequency/urgency Musculoskeletal ROS: negative for - joint swelling or muscular weakness Neurological ROS: as noted in HPI Dermatological ROS: negative for rash and skin lesion changes   Blood pressure 144/70, pulse 71, temperature 99 F (37.2 C), temperature source Oral, resp. rate 13, height 5\' 1"  (1.549 m), weight 97.07 kg (214 lb), SpO2 98.00%.   Neurologic Examination:                                                                                                      Mental Status: Lethargic, oriented, thought content appropriate.  Speech fluent without evidence of aphasia.  Able to follow 3 step commands without difficulty. Cranial Nerves: II: Discs flat bilaterally; Visual fields grossly normal, pupils equal, round, reactive to light and accommodation III,IV, VI: ptosis not present, extra-ocular motions intact bilaterally V,VII: decrease in right NLF, facial light touch sensation normal bilaterally VIII: hearing normal bilaterally IX,X: gag reflex present XI: bilateral shoulder shrug XII: midline tongue extension Motor: Right : Upper  extremity   5/5    Left:     Upper extremity   5/5  Lower extremity   4/5     Lower extremity   5/5 --strength limited by pain in chest with movement Tone and bulk:normal tone throughout; no atrophy noted Sensory: Pinprick and light touch intact throughout, bilaterally Deep Tendon Reflexes:  Right: Upper Extremity   Left: Upper extremity   biceps (C-5 to C-6) 2/4   biceps (C-5 to C-6) 2/4 tricep (C7) 2/4    triceps (C7) 2/4 Brachioradialis (C6) 2/4  Brachioradialis (C6) 2/4  Lower Extremity Lower Extremity  quadriceps (L-2 to L-4) 2/4   quadriceps (L-2 to L-4) 2/4 Achilles (S1) 2/4   Achilles (S1) 2/4  Plantars: Right: downgoing   Left: downgoing Cerebellar: normal finger-to-nose,  normal heel-to-shin test CV: pulses palpable throughout    Lab Results  Component Value Date/Time   CHOL 150 03/30/2012 10:23 AM    Results for orders placed during the hospital encounter of 01/16/13 (from the past 48 hour(s))  GLUCOSE, CAPILLARY     Status: Abnormal   Collection Time    01/17/13  8:00 PM      Result Value Range   Glucose-Capillary 213 (*) 70 - 99 mg/dL   Comment 1 Notify RN     Comment 2 Documented in Chart    GLUCOSE, CAPILLARY     Status: Abnormal   Collection Time    01/18/13 12:09 AM      Result Value Range   Glucose-Capillary 141 (*) 70 - 99 mg/dL   Comment 1 Notify RN    CBC     Status: Abnormal   Collection Time    01/18/13  3:45 AM      Result Value Range   WBC 16.0 (*) 4.0 - 10.5 K/uL   RBC 4.09  3.87 - 5.11 MIL/uL   Hemoglobin 10.8 (*) 12.0 - 15.0 g/dL   HCT 32.9 (*) 36.0 - 46.0 %   MCV 80.4  78.0 - 100.0 fL   MCH 26.4  26.0 - 34.0 pg   MCHC 32.8  30.0 - 36.0 g/dL   RDW 14.5  11.5 - 15.5 %   Platelets 209  150 - 400 K/uL  BASIC METABOLIC PANEL     Status: Abnormal   Collection Time    01/18/13  3:45 AM      Result Value Range   Sodium 134 (*) 135 - 145 mEq/L   Potassium 3.4 (*) 3.5 - 5.1 mEq/L   Chloride 92 (*) 96 - 112 mEq/L   CO2 33 (*) 19 - 32  mEq/L   Glucose, Bld 139 (*) 70 - 99 mg/dL   BUN 30 (*) 6 - 23 mg/dL   Creatinine, Ser 0.84  0.50 - 1.10 mg/dL   Calcium 9.2  8.4 - 10.5 mg/dL   GFR calc non Af Amer 69 (*) >90 mL/min   GFR calc Af Amer 80 (*) >90 mL/min   Comment:            The eGFR has been calculated     using the CKD EPI equation.     This calculation has not been     validated in all clinical     situations.     eGFR's persistently     <90 mL/min signify     possible Chronic Kidney Disease.  MAGNESIUM     Status: None   Collection Time    01/18/13  3:45 AM      Result Value Range   Magnesium 2.4  1.5 - 2.5 mg/dL  PHOSPHORUS     Status: None   Collection Time    01/18/13  3:45 AM      Result Value Range   Phosphorus 3.9  2.3 - 4.6 mg/dL  GLUCOSE, CAPILLARY     Status: Abnormal   Collection Time    01/18/13  4:01 AM      Result Value Range   Glucose-Capillary 143 (*) 70 - 99 mg/dL  GLUCOSE, CAPILLARY     Status: Abnormal   Collection Time    01/18/13  8:03 AM      Result Value Range   Glucose-Capillary 162 (*) 70 - 99 mg/dL  GLUCOSE, CAPILLARY     Status: Abnormal   Collection Time    01/18/13 12:40 PM      Result Value Range   Glucose-Capillary 199 (*) 70 - 99 mg/dL   Comment 1 Documented in Chart     Comment 2 Notify RN    GLUCOSE, CAPILLARY     Status: Abnormal   Collection Time    01/18/13  4:20 PM      Result Value Range   Glucose-Capillary 251 (*) 70 - 99 mg/dL   Comment 1 Documented in Chart     Comment 2 Notify RN    GLUCOSE, CAPILLARY     Status: Abnormal   Collection Time    01/18/13  7:28 PM      Result Value Range   Glucose-Capillary 239 (*) 70 - 99 mg/dL   Comment 1 Notify RN     Comment 2 Documented in Chart    GLUCOSE, CAPILLARY     Status: Abnormal   Collection Time    01/18/13 10:31 PM      Result Value Range   Glucose-Capillary 100 (*) 70 - 99 mg/dL   Comment 1 Notify RN     Comment 2 Documented in Chart    GLUCOSE, CAPILLARY     Status: None   Collection Time  01/18/13 11:45 PM      Result Value Range   Glucose-Capillary 92  70 - 99 mg/dL   Comment 1 Notify RN     Comment 2 Documented in Chart    GLUCOSE, CAPILLARY     Status: Abnormal   Collection Time    01/19/13  3:45 AM      Result Value Range   Glucose-Capillary 150 (*) 70 - 99 mg/dL   Comment 1 Notify RN     Comment 2 Documented in Chart    CBC     Status: Abnormal   Collection Time    01/19/13  5:16 AM      Result Value Range   WBC 16.4 (*) 4.0 - 10.5 K/uL   RBC 4.00  3.87 - 5.11 MIL/uL   Hemoglobin 10.5 (*) 12.0 - 15.0 g/dL   HCT 32.5 (*) 36.0 - 46.0 %   MCV 81.3  78.0 - 100.0 fL   MCH 26.3  26.0 - 34.0 pg   MCHC 32.3  30.0 - 36.0 g/dL   RDW 14.6  11.5 - 15.5 %   Platelets 234  150 - 400 K/uL  BASIC METABOLIC PANEL     Status: Abnormal   Collection Time    01/19/13  5:16 AM      Result Value Range   Sodium 134 (*) 135 - 145 mEq/L   Potassium 3.9  3.5 - 5.1 mEq/L   Chloride 92 (*) 96 - 112 mEq/L   CO2 31  19 - 32 mEq/L   Glucose, Bld 155 (*) 70 - 99 mg/dL   BUN 28 (*) 6 - 23 mg/dL   Creatinine, Ser 0.77  0.50 - 1.10 mg/dL   Calcium 9.1  8.4 - 10.5 mg/dL   GFR calc non Af Amer 84 (*) >90 mL/min   GFR calc Af Amer >90  >90 mL/min   Comment:            The eGFR has been calculated     using the CKD EPI equation.     This calculation has not been     validated in all clinical     situations.     eGFR's persistently     <90 mL/min signify     possible Chronic Kidney Disease.  MAGNESIUM     Status: None   Collection Time    01/19/13  5:16 AM      Result Value Range   Magnesium 2.0  1.5 - 2.5 mg/dL  PHOSPHORUS     Status: None   Collection Time    01/19/13  5:16 AM      Result Value Range   Phosphorus 4.4  2.3 - 4.6 mg/dL  GLUCOSE, CAPILLARY     Status: Abnormal   Collection Time    01/19/13  7:57 AM      Result Value Range   Glucose-Capillary 163 (*) 70 - 99 mg/dL   Comment 1 Documented in Chart     Comment 2 Notify RN    GLUCOSE, CAPILLARY     Status:  Abnormal   Collection Time    01/19/13 11:24 AM      Result Value Range   Glucose-Capillary 249 (*) 70 - 99 mg/dL  GLUCOSE, CAPILLARY     Status: Abnormal   Collection Time    01/19/13  1:28 PM      Result Value Range   Glucose-Capillary 196 (*) 70 - 99 mg/dL    Ct Head Wo  Contrast  01/18/2013   *RADIOLOGY REPORT*  Clinical Data: Follow up subarachnoid hemorrhage.  Altered mental status.  CT HEAD WITHOUT CONTRAST  Technique:  Contiguous axial images were obtained from the base of the skull through the vertex without contrast.  Comparison: 01/17/2013.  Findings: The right frontal subarachnoid hemorrhage has become even less obvious than on yesterday's examination.  Small amount of subarachnoid hemorrhage has settled posteriorly over the posterior frontal lobe near the parietal lobe (image number 21 series 2. There is no hydrocephalus.  No mass effect.  No midline shift. Calvarium intact.  The tiny focus of high attenuation in the left frontal lobe is no longer seen, likely due to partial volume averaging.  No infarct.  Paranasal sinuses are within normal limits.  IMPRESSION: Expected evolution of right frontal subarachnoid hemorrhage, now scarcely visible.  No complicating features such as hydrocephalus.   Original Report Authenticated By: Dereck Ligas, M.D.   Dg Chest Port 1 View  01/18/2013   *RADIOLOGY REPORT*  Clinical Data: Respiratory failure.  PORTABLE CHEST - 1 VIEW  Comparison: 01/17/2013.  Findings: Interval tracheal and esophageal extubation.  Mild cardiomegaly.  Lobulated mediastinal contours, likely vascular when correlated with chest CT 01/16/2013.  There is atherosclerotic calcification and calcified mediastinal lymph nodes.  Low volume lungs with diffuse interstitial prominence.  There are still patchy opacities in the perihilar regions.  No significant effusion.  No pneumothorax.  IMPRESSION:  1. Pulmonary venous congestion  2. Persistent perihilar opacities which could represent  atelectasis or pneumonia.   Original Report Authenticated By: Francena Hanly PA-C [facility] [phone]  01/19/2013, 4:36 PM   Patient seen and examined.  Clinical course and management discussed.  Necessary edits performed.  I agree with the above.  Assessment and plan of care developed and discussed below.    Assessment/Plan: 67 year old female with a recent [place] felt secondary to anticoagulation who was noted today by nursing to have a seizure.  Patient had recently been given Tramadol as well.  Seizure short-lived.  Patient now back to baseline.  Although SAH can predispose to seizure, Tramadol may cause seizure as well.    Recommendations: 1.  EEG 2.  Repeat head CT.  If SAH worsening will start anticonvulsants, otherwise anticonvulsants not indicated at this time and may more likely be related to Tramadol use.   3.  Seizure precautions 4.  May use Ativan prn if necessary  Alexis Goodell, MD [facility] [phone]  01/19/2013  5:46 PM

## 2013-01-19 NOTE — Progress Notes (Addendum)
Minimal post traumatic SAH without parenchymal involvement or mass effect.  No evidence of stroke or other complicating features.followup head CT scan demonstrates no progression of his minimal parietal subarachnoid blood.  I have recommended  That anticoagulation be held at least 2 weeks.  At that point the patient should have a assessment with regarding her fall risk versus relative benefit of further  Anti-coagulation.

## 2013-01-19 NOTE — Progress Notes (Addendum)
[facility] Progress Note [place] TEAM 1 - Stepdown/ICU TEAM   Debra Saunders [id number] DOB: 1946-05-22 DOA: 01/16/2013 PCP: [name], MD  Brief narrative: This is a 67 year old female with a past medical history of diabetes mellitus, hypertension, chronic congestive heart failure, moderate to severe pulmonary hypertension, obstructive sleep apnea, COPD on 2 L of oxygen at home.  The patient was admitted to [facility] on 01/11/13 with a complaint of chest pain. It was thought to be secondary to chest wall pain/costochondritis. Cardiology consult was requested and they agreed that it was noncardiac in origin. She had a cath in August 2013 which was negative.    A CT of the chest was performedduring the workup for her chest pain and results are as follows "cannot exclude tiny pulmonary embolus in a small segmental branch right lower lobe" and "persistent mild interstitial changes suggestive of pneumonitis or CHF these have improved from prior study in 7/22." She was started on Xarelto for the PE. Duplex of the lower extremities performed here was negative for DVTs.   She had a PEA arrest on 7/27 at 4:30 PM after 30 seconds of chest compressions she began to wake up but was noted to be confused with slurred speech and was not able to protect her airway and therefore was intubated. Further workup revealed a small subarachnoid hemorrhage and she was therefore transferred to [facility] to be further evaluated by neurosurgery. She has not yet been evaluated by neurosurgery despite a consult request.  Patient was managed in the ICU initially and eventually was able to be extubated. Dr.Yacoub also spoke with radiology and the CT of the chestfrom [facility] was reviewed- it was read to be positive for PE - Her Xarelto has been discontinued due to subarachnoid hemorrhage- she had an IVC filter placed on 7/28- as mentioned above lower extremity  duplex was or DVT   Assessment/Plan: Principal Problem:   Subarachnoid hemorrhage -Have spoken with neurosurgery again today and they will come and consult -Continue to hold Xarelto-retrievable IVC filter is in place -patient's mental status is back to baseline  Active Problems:   Pulmonary embolus-subsegmental tiny PE -holding off on Xarelto -IVC filter in place-no DVT noted -doubt the PE has any contribution to her chest pain  Chest pain-Cardiac workup has been negative. Although PE was found on the CT it is too small to be causing her any pain.   (a) Musculoskeletal chest pain- was on Naprosyn and Toradol for this, however, began having pain with swallowing which is likely due to esophagitis therefore I have discontinued Naprosyn and Toradol.Will treat with Aspercreme, heating pad and Ultram.  (b) probable esophagitis-this pain resolved immediately after a GI cocktail with lidocaine was administered today. Therefore we'll treat this with Protonix daily, Pepcid twice a day and GI cocktail with lidocaine 3 times a day as needed. He should probably continue this treatment for at least a month.     Hypertension -continue current medications -on Coreg    Chronic diastolic heart failure/  Pulmonary hypertension -was given Lasix x 3 doses on 7-29 -echo performed recently at [facility]-need to followup  Severe COPD on 2 L of oxygen at home -Not having an exacerbation at this time -when necessary albuterol nebulizers    Diabetes mellitus type 2, controlled -Takes metformin at home-Currently on sliding scale insulin with meals    OSA (obstructive sleep apnea) -CPAP at bedtime  Code Status: full code Family Communication: discussed with daughter  Disposition Plan: transfer out of the step down unit  Consultants: none  DVT prophylaxis: SCDs  HPI/Subjective: Patient has been complaining of chest pain on and off. It initially improved after a GI cocktail with lidocaine  but then recurred after she started eating and with moving around.This is the same pain that she was admitted for at [facility] last week. She has no other complaints   Objective: Blood pressure 136/72, pulse 71, temperature 98.4 F (36.9 C), temperature source Oral, resp. rate 13, height 5\' 1"  (1.549 m), weight 97.07 kg (214 lb), SpO2 98.00%.  Intake/Output Summary (Last 24 hours) at 01/19/13 1354 Last data filed at 01/19/13 0752  Gross per 24 hour  Intake    320 ml  Output    750 ml  Net   -430 ml     Exam: General: No acute respiratory distress Lungs: Clear to auscultation bilaterally without wheezes or crackles Cardiovascular: Regular rate and rhythm without murmur gallop or rub normal S1 and S2 Abdomen: Nontender, nondistended, soft, bowel sounds positive, no rebound, no ascites, no appreciable mass Extremities: No significant cyanosis, clubbing, or edema bilateral lower extremities  Data Reviewed: Basic Metabolic Panel:  Recent Labs Lab 01/17/13 0130 01/18/13 0345 01/19/13 0516  NA 131* 134* 134*  K 3.7 3.4* 3.9  CL 91* 92* 92*  CO2 34* 33* 31  GLUCOSE 141* 139* 155*  BUN 35* 30* 28*  CREATININE 0.80 0.84 0.77  CALCIUM 8.9 9.2 9.1  MG 2.6* 2.4 2.0  PHOS 3.3 3.9 4.4   Liver Function Tests:  Recent Labs Lab 01/17/13 0130  AST 49*  ALT 74*  ALKPHOS 105  BILITOT 0.5  PROT 7.1  ALBUMIN 2.7*   No results found for this basename: LIPASE, AMYLASE,  in the last 168 hours No results found for this basename: AMMONIA,  in the last 168 hours CBC:  Recent Labs Lab 01/17/13 0130 01/18/13 0345 01/19/13 0516  WBC 17.5* 16.0* 16.4*  HGB 10.1* 10.8* 10.5*  HCT 30.0* 32.9* 32.5*  MCV 79.4 80.4 81.3  PLT 184 209 234   Cardiac Enzymes: No results found for this basename: CKTOTAL, CKMB, CKMBINDEX, TROPONINI,  in the last 168 hours BNP (last 3 results) No results found for this basename: PROBNP,  in the last 8760 hours CBG:  Recent Labs Lab  01/18/13 2345 01/19/13 0345 01/19/13 0757 01/19/13 1124 01/19/13 1328  GLUCAP 92 150* 163* 249* 196*    Recent Results (from the past 240 hour(s))  MRSA PCR SCREENING     Status: None   Collection Time    01/16/13 10:54 PM      Result Value Range Status   MRSA by PCR NEGATIVE  NEGATIVE Final   Comment:            [name] (FDA     approved for NASAL specimens     only), is one component of a     comprehensive MRSA colonization     surveillance program. It is not     intended to diagnose MRSA     infection nor to guide or     monitor treatment for     MRSA infections.  CULTURE, BLOOD (ROUTINE X 2)     Status: None   Collection Time    01/17/13 11:00 AM      Result Value Range Status   Specimen Description BLOOD RIGHT HAND   Final   Special Requests     Final   Value:  BOTTLES DRAWN AEROBIC AND ANAEROBIC AERO 10CC ANA 3CC   Culture  Setup Time 01/17/2013 17:38   Final   Culture     Final   Value:        BLOOD CULTURE RECEIVED NO GROWTH TO DATE CULTURE WILL BE HELD FOR 5 DAYS BEFORE ISSUING A FINAL NEGATIVE REPORT   Report Status PENDING   Incomplete  CULTURE, BLOOD (ROUTINE X 2)     Status: None   Collection Time    01/17/13 11:10 AM      Result Value Range Status   Specimen Description BLOOD RIGHT HAND   Final   Special Requests BOTTLES DRAWN AEROBIC ONLY 10CC   Final   Culture  Setup Time 01/17/2013 17:38   Final   Culture     Final   Value:        BLOOD CULTURE RECEIVED NO GROWTH TO DATE CULTURE WILL BE HELD FOR 5 DAYS BEFORE ISSUING A FINAL NEGATIVE REPORT   Report Status PENDING   Incomplete     Studies:  Recent x-ray studies have been reviewed in detail by the Attending Physician  Scheduled Meds:  Scheduled Meds: . antiseptic oral rinse  15 mL Mouth Rinse QID  . carvedilol  3.125 mg Oral BID WC  . chlorhexidine  15 mL Mouth Rinse BID  . famotidine  20 mg Oral BID  . insulin aspart  0-20 Units Subcutaneous Q4H  . MUSCLE RUB   Topical TID   . pantoprazole  40 mg Oral Daily  . vancomycin  1,000 mg Intravenous Q12H   Continuous Infusions:   Time spent on care of this patient:35 minutes   Debbe Odea, MD  [facility] Office  [phone] Pager - Text Page per Shea Evans as per below:  On-Call/Text Page:      [email or web address]      password [email or web address]  If 7PM-7AM, please contact night-coverage [email or web address] Password [email or web address] 01/19/2013, 1:54 PM   LOS: 3 days

## 2013-01-19 NOTE — Progress Notes (Signed)
Patient was heard calling out "help" the a yell. RN walked in the room to find patient unresponsive and tremor ing though out body. Patient was starring strait forward they looked up and to the right. This episode last less than a 60 seconds. Patient stop tremoring and was able to talk. Patient was AAOX4 but lethargic. Denies pain. Denies headache. Patient has remarked weakness in the RUL and R pupil is suglish to respond.   Rapid responds  is notified, Dr Wynelle Cleveland is notified. Patient has call bell in reach family at bed side.

## 2013-01-19 NOTE — Progress Notes (Signed)
PT Cancellation Note  Patient Details Name: Debra Saunders MRN: [id number] DOB: September 27, 1945   Cancelled Treatment:    Reason Eval/Treat Not Completed: Medical issues which prohibited therapy. Pt deferred PT at this time due to 10/10 chest pain. PT to return as able.   Kingsley Callander 01/19/2013, 2:15 PM  Kittie Plater, PT, DPT Pager #: [phone] Office #: [phone]

## 2013-01-19 NOTE — Progress Notes (Addendum)
Called by RN for seizure like activity about 1.5 hrs after being given Tramadol.  Pt examined- restless, states she feels hot but is otherwise oriented to time and place.  Neuro exam is non-focal.  D/c Tramadol which in the setting of a SAH is likely what caused the seizure.   Will order repeat head CT scan now. Dr Tyron Russell will come and consult.  cancel transfer to floor   Debbe Odea, MD [phone]

## 2013-01-19 NOTE — Progress Notes (Signed)
[facility] Progress Note [place] TEAM 1 - Stepdown/ICU TEAM   Debra Saunders [id number] DOB: 02/22/1946 DOA: 01/16/2013 PCP: [name], MD  Brief narrative: This is a 67 year old female with a past medical history of diabetes mellitus, hypertension, chronic congestive heart failure, moderate to severe pulmonary hypertension, obstructive sleep apnea, COPD on 2 L of oxygen at home.  The patient was admitted to [facility] on 01/11/13 with a complaint of chest pain. It was thought to be secondary to chest wall pain/costochondritis. Cardiology consult was requested and they agreed that it was noncardiac in origin. She had a cath in August 2013 which was negative.    A CT of the chest was performedduring the workup for her chest pain and results are as follows "cannot exclude tiny pulmonary embolus in a small segmental branch right lower lobe" and "persistent mild interstitial changes suggestive of pneumonitis or CHF these have improved from prior study in 7/22." She was started on Xarelto for the PE.    She had a PEA arrest on 7/27 at 4:30 PM after 30 seconds of chest compressions she began to wake up but was noted to be confused with slurred speech and was not able to protect her airway and therefore was intubated. Further workup revealed a small subarachnoid hemorrhage and she was therefore transferred to [facility] to be further evaluated by neurosurgery. She has not yet been evaluated by neurosurgery despite a consult request.  Patient was managed in the ICU initially and eventually was able to be extubated. Dr.Yacoub also spoke with radiology and the CT of the chestfrom [facility] was reviewed- it was read to be positive for PE - Her Xarelto has been discontinued due to subarachnoid hemorrhage- she had an IVC filter placed on 7/28- lower extremity duplex was negative for DVT   Assessment/Plan: Principal Problem:   Subarachnoid  hemorrhage -Have spoken with neurosurgery again today and they will come and consult -Continue to hold Xarelto-retrievable IVC filter is in place -patient's mental status is back to baseline  Active Problems:   Pulmonary embolus-subsegmental tiny PE -holding off on Xarelto -IVC filter in place-no DVT noted -doubt the PE has any contribution to her chest pain  Chest pain-Cardiac workup has been negative. Although PE was found on the CT it is too small to be causing her any pain.   (a) Musculoskeletal chest pain- was on Naprosyn and Toradol for this, however, began having pain with swallowing which is likely due to esophagitis therefore I have discontinued Naprosyn and Toradol.Will treat with Aspercreme, heating pad and Ultram.  (b) probable esophagitis-this pain resolved immediately after a GI cocktail with lidocaine was administered today. Therefore we'll treat this with Protonix daily, Pepcid twice a day and GI cocktail with lidocaine 3 times a day as needed. He should probably continue this treatment for at least a month.     Hypertension -continue current medications -on Coreg    Chronic diastolic heart failure/  Pulmonary hypertension -was given Lasix x 3 doses on 7-29 -echo performed recently at [facility]-need to followup  Severe COPD on 2 L of oxygen at home -Not having an exacerbation at this time -when necessary albuterol nebulizers    Diabetes mellitus type 2, controlled -Takes metformin at home-Currently on sliding scale insulin with meals    OSA (obstructive sleep apnea) -CPAP at bedtime  Code Status: full code Family Communication: discussed with daughter Disposition Plan: transfer out of the step down unit  Consultants: none  DVT prophylaxis: SCDs  HPI/Subjective: Patient has been complaining of chest pain on and off. It initially improved after a GI cocktail with lidocaine but then recurred after she started eating and with moving around.This is the  same pain that she was admitted for at [facility] last week. She has no other complaints   Objective: Blood pressure 136/72, pulse 71, temperature 98.4 F (36.9 C), temperature source Oral, resp. rate 13, height 5\' 1"  (1.549 m), weight 97.07 kg (214 lb), SpO2 98.00%.  Intake/Output Summary (Last 24 hours) at 01/19/13 1414 Last data filed at 01/19/13 0752  Gross per 24 hour  Intake    320 ml  Output    750 ml  Net   -430 ml     Exam: General: No acute respiratory distress Lungs: Clear to auscultation bilaterally without wheezes or crackles Cardiovascular: Regular rate and rhythm without murmur gallop or rub normal S1 and S2 Abdomen: Nontender, nondistended, soft, bowel sounds positive, no rebound, no ascites, no appreciable mass Extremities: No significant cyanosis, clubbing, or edema bilateral lower extremities  Data Reviewed: Basic Metabolic Panel:  Recent Labs Lab 01/17/13 0130 01/18/13 0345 01/19/13 0516  NA 131* 134* 134*  K 3.7 3.4* 3.9  CL 91* 92* 92*  CO2 34* 33* 31  GLUCOSE 141* 139* 155*  BUN 35* 30* 28*  CREATININE 0.80 0.84 0.77  CALCIUM 8.9 9.2 9.1  MG 2.6* 2.4 2.0  PHOS 3.3 3.9 4.4   Liver Function Tests:  Recent Labs Lab 01/17/13 0130  AST 49*  ALT 74*  ALKPHOS 105  BILITOT 0.5  PROT 7.1  ALBUMIN 2.7*   No results found for this basename: LIPASE, AMYLASE,  in the last 168 hours No results found for this basename: AMMONIA,  in the last 168 hours CBC:  Recent Labs Lab 01/17/13 0130 01/18/13 0345 01/19/13 0516  WBC 17.5* 16.0* 16.4*  HGB 10.1* 10.8* 10.5*  HCT 30.0* 32.9* 32.5*  MCV 79.4 80.4 81.3  PLT 184 209 234   Cardiac Enzymes: No results found for this basename: CKTOTAL, CKMB, CKMBINDEX, TROPONINI,  in the last 168 hours BNP (last 3 results) No results found for this basename: PROBNP,  in the last 8760 hours CBG:  Recent Labs Lab 01/18/13 2345 01/19/13 0345 01/19/13 0757 01/19/13 1124 01/19/13 1328  GLUCAP 92  150* 163* 249* 196*    Recent Results (from the past 240 hour(s))  MRSA PCR SCREENING     Status: None   Collection Time    01/16/13 10:54 PM      Result Value Range Status   MRSA by PCR NEGATIVE  NEGATIVE Final   Comment:            [name] (FDA     approved for NASAL specimens     only), is one component of a     comprehensive MRSA colonization     surveillance program. It is not     intended to diagnose MRSA     infection nor to guide or     monitor treatment for     MRSA infections.  CULTURE, BLOOD (ROUTINE X 2)     Status: None   Collection Time    01/17/13 11:00 AM      Result Value Range Status   Specimen Description BLOOD RIGHT HAND   Final   Special Requests     Final   Value: BOTTLES DRAWN AEROBIC AND ANAEROBIC AERO 10CC ANA 3CC   Culture  Setup Time 01/17/2013 17:38   Final   Culture     Final   Value:        BLOOD CULTURE RECEIVED NO GROWTH TO DATE CULTURE WILL BE HELD FOR 5 DAYS BEFORE ISSUING A FINAL NEGATIVE REPORT   Report Status PENDING   Incomplete  CULTURE, BLOOD (ROUTINE X 2)     Status: None   Collection Time    01/17/13 11:10 AM      Result Value Range Status   Specimen Description BLOOD RIGHT HAND   Final   Special Requests BOTTLES DRAWN AEROBIC ONLY 10CC   Final   Culture  Setup Time 01/17/2013 17:38   Final   Culture     Final   Value:        BLOOD CULTURE RECEIVED NO GROWTH TO DATE CULTURE WILL BE HELD FOR 5 DAYS BEFORE ISSUING A FINAL NEGATIVE REPORT   Report Status PENDING   Incomplete     Studies:  Recent x-ray studies have been reviewed in detail by the Attending Physician  Scheduled Meds:  Scheduled Meds: . antiseptic oral rinse  15 mL Mouth Rinse QID  . carvedilol  3.125 mg Oral BID WC  . chlorhexidine  15 mL Mouth Rinse BID  . famotidine  20 mg Oral BID  . insulin aspart  0-20 Units Subcutaneous Q4H  . MUSCLE RUB   Topical TID  . pantoprazole  40 mg Oral Daily   Continuous Infusions:   Time spent on care of  this patient:35 minutes   Debbe Odea, MD  [facility] Office  [phone] Pager - Text Page per Shea Evans as per below:  On-Call/Text Page:      [email or web address]      password [email or web address]  If 7PM-7AM, please contact night-coverage [email or web address] Password [email or web address] 01/19/2013, 2:14 PM   LOS: 3 days

## 2013-01-20 DIAGNOSIS — I5032 Chronic diastolic (congestive) heart failure: Secondary | ICD-10-CM

## 2013-01-20 LAB — GLUCOSE, CAPILLARY
Glucose-Capillary: 171 mg/dL — ABNORMAL HIGH (ref 70–99)
Glucose-Capillary: 293 mg/dL — ABNORMAL HIGH (ref 70–99)
Glucose-Capillary: 182 mg/dL — ABNORMAL HIGH (ref 70–99)
Glucose-Capillary: 242 mg/dL — ABNORMAL HIGH (ref 70–99)

## 2013-01-20 LAB — TROPONIN I
Troponin I: <0.3 ng/mL (ref <0.30)
Troponin I: <0.3 ng/mL (ref <0.30)

## 2013-01-20 MED ORDER — HYDROCODONE-ACETAMINOPHEN 5-325 MG PO TABS: 1.0000 | ORAL_TABLET | ORAL | Status: DC | PRN

## 2013-01-20 MED ORDER — ENSURE COMPLETE PO LIQD
237.0000 mL | Freq: Three times a day (TID) | ORAL | Status: DC
  Administered 2013-01-20 – 2013-01-27 (×17): 237 mL via ORAL

## 2013-01-20 MED ORDER — ENSURE PUDDING PO PUDG: 1.0000 | Freq: Three times a day (TID) | ORAL | Status: DC

## 2013-01-20 MED ORDER — HYDROCODONE-ACETAMINOPHEN 5-325 MG PO TABS
1.0000 | ORAL_TABLET | ORAL | Status: DC | PRN
  Administered 2013-01-20 (×2): 1 via ORAL
  Administered 2013-01-20 – 2013-01-27 (×24): 2 via ORAL
  Filled 2013-01-20 (×4): qty 2
  Filled 2013-01-20: qty 1
  Filled 2013-01-20 (×10): qty 2
  Filled 2013-01-20: qty 1
  Filled 2013-01-20 (×6): qty 2
  Filled 2013-01-20: qty 1
  Filled 2013-01-20 (×5): qty 2

## 2013-01-20 NOTE — Progress Notes (Signed)
Subjective: Patient awake and alert.  No further seizure activity noted.  Repeat head CT reviewed and shows improvement in [facility].  No further Ultram administered.    Objective: Current vital signs: BP 109/58  Pulse 59  Temp(Src) 98.6 F (37 C) (Oral)  Resp 20  Ht 5\' 1"  (1.549 m)  Wt 98.4 kg (216 lb 14.9 oz)  BMI 41.01 kg/m2  SpO2 98% Vital signs in last 24 hours: Temp:  [98.4 F (36.9 C)-99 F (37.2 C)] 98.6 F (37 C) (07/31 0751) Pulse Rate:  [59-95] 59 (07/31 0400) Resp:  [16-24] 20 (07/31 0400) BP: (109-144)/(56-77) 109/58 mmHg (07/31 0400) SpO2:  [95 %-99 %] 98 % (07/31 0400) Weight:  [98.4 kg (216 lb 14.9 oz)] 98.4 kg (216 lb 14.9 oz) (07/31 0500)  Intake/Output from previous day: 07/30 0701 - 07/31 0700 In: 350 [P.O.:350] Out: 925 [Urine:925] Intake/Output this shift:   Nutritional status: Carb Control  Neurologic Exam: Mental Status:  Awake, oriented, thought content appropriate. Speech fluent without evidence of aphasia. Able to follow 3 step commands without difficulty.  Cranial Nerves:  II: Discs flat bilaterally; Visual fields grossly normal, pupils equal, round, reactive to light and accommodation  III,IV, VI: ptosis not present, extra-ocular motions intact bilaterally  V,VII: smile symmetric, facial light touch sensation normal bilaterally  VIII: hearing normal bilaterally  IX,X: gag reflex present  XI: bilateral shoulder shrug  XII: midline tongue extension  Motor:  Lifts both upper extremities easily above her head  Sensory: Pinprick and light touch intact throughout, bilaterally  Deep Tendon Reflexes:  2+ throughout Plantars: Downgoing bilaterally   Lab Results: Basic Metabolic Panel:  Recent Labs Lab 01/17/13 0130 01/18/13 0345 01/19/13 0516  NA 131* 134* 134*  K 3.7 3.4* 3.9  CL 91* 92* 92*  CO2 34* 33* 31  GLUCOSE 141* 139* 155*  BUN 35* 30* 28*  CREATININE 0.80 0.84 0.77  CALCIUM 8.9 9.2 9.1  MG 2.6* 2.4 2.0  PHOS 3.3 3.9 4.4     Liver Function Tests:  Recent Labs Lab 01/17/13 0130  AST 49*  ALT 74*  ALKPHOS 105  BILITOT 0.5  PROT 7.1  ALBUMIN 2.7*   No results found for this basename: LIPASE, AMYLASE,  in the last 168 hours No results found for this basename: AMMONIA,  in the last 168 hours  CBC:  Recent Labs Lab 01/17/13 0130 01/18/13 0345 01/19/13 0516  WBC 17.5* 16.0* 16.4*  HGB 10.1* 10.8* 10.5*  HCT 30.0* 32.9* 32.5*  MCV 79.4 80.4 81.3  PLT 184 209 234    Cardiac Enzymes:  Recent Labs Lab 01/20/13 0308 01/20/13 0830  TROPONINI <0.30 <0.30    Lipid Panel: No results found for this basename: CHOL, TRIG, HDL, CHOLHDL, VLDL, LDLCALC,  in the last 168 hours  CBG:  Recent Labs Lab 01/19/13 1624 01/19/13 1718 01/19/13 1924 01/19/13 2132 01/20/13 0805  GLUCAP 243* 226* 268* 219* 171*    Microbiology: Results for orders placed during the hospital encounter of 01/16/13  MRSA PCR SCREENING     Status: None   Collection Time    01/16/13 10:54 PM      Result Value Range Status   MRSA by PCR NEGATIVE  NEGATIVE Final   Comment:            [name] (FDA     approved for NASAL specimens     only), is one component of a     comprehensive MRSA colonization  surveillance program. It is not     intended to diagnose MRSA     infection nor to guide or     monitor treatment for     MRSA infections.  CULTURE, BLOOD (ROUTINE X 2)     Status: None   Collection Time    01/17/13 11:00 AM      Result Value Range Status   Specimen Description BLOOD RIGHT HAND   Final   Special Requests     Final   Value: BOTTLES DRAWN AEROBIC AND ANAEROBIC AERO 10CC ANA 3CC   Culture  Setup Time 01/17/2013 17:38   Final   Culture     Final   Value:        BLOOD CULTURE RECEIVED NO GROWTH TO DATE CULTURE WILL BE HELD FOR 5 DAYS BEFORE ISSUING A FINAL NEGATIVE REPORT   Report Status PENDING   Incomplete  CULTURE, BLOOD (ROUTINE X 2)     Status: None   Collection Time     01/17/13 11:10 AM      Result Value Range Status   Specimen Description BLOOD RIGHT HAND   Final   Special Requests BOTTLES DRAWN AEROBIC ONLY 10CC   Final   Culture  Setup Time 01/17/2013 17:38   Final   Culture     Final   Value:        BLOOD CULTURE RECEIVED NO GROWTH TO DATE CULTURE WILL BE HELD FOR 5 DAYS BEFORE ISSUING A FINAL NEGATIVE REPORT   Report Status PENDING   Incomplete    Coagulation Studies: No results found for this basename: LABPROT, INR,  in the last 72 hours  Imaging: Ct Head Wo Contrast  01/19/2013   *RADIOLOGY REPORT*  Clinical Data: Subarachnoid hemorrhage and seizure.  CT HEAD WITHOUT CONTRAST  Technique:  Contiguous axial images were obtained from the base of the skull through the vertex without contrast.  Comparison: 01/18/2013  Findings: There is barely any residual blood visualized in the high right vertex.  There is no evidence of intraventricular blood, hydrocephalus, mass effect, abnormal edema, infarction or subdural blood.  The skull is unremarkable.  IMPRESSION: Minimal residual visualized subarachnoid blood at the high right posterior vertex.  No interval new findings.   Original Report Authenticated By: Aletta Edouard, M.D.    Medications:  I have reviewed the patient's current medications. Scheduled: . antiseptic oral rinse  15 mL Mouth Rinse QID  . carvedilol  3.125 mg Oral BID WC  . chlorhexidine  15 mL Mouth Rinse BID  . famotidine  20 mg Oral BID  . insulin aspart  0-20 Units Subcutaneous TID WC  . MUSCLE RUB   Topical TID  . pantoprazole  40 mg Oral Daily    Assessment/Plan: Patient with no further seizure-like events-questionably related to medications.  Head CT improved.  EEG pending.    Recommended: 1. Anticonvulsant therapy not indicated at this time. 2. Will follow up results of EEG.   LOS: 4 days   Alexis Goodell, MD [facility] [phone] 01/20/2013  9:38 AM

## 2013-01-20 NOTE — Progress Notes (Signed)
At 0130 pt complaining of 6/10 chest pressure and holding mid-chest. Pt given PO GI cocktail as ordered. Still complaining of "dull chest pressure" at this time and given po tylenol. NP Baltazar Najjar made aware and new orders received. EKG done and VVS. Will continue to monitor.

## 2013-01-20 NOTE — Progress Notes (Signed)
Physical Therapy Treatment Patient Details Name: Debra Saunders MRN: [id number] DOB: 06/30/1945 Today's Date: 01/20/2013 Time: [id number] PT Time Calculation (min): 26 min  PT Assessment / Plan / Recommendation  History of Present Illness 67 years old female with PMH relevant for DM, HTN, diastolic CHF, PAH, COPD, OSA, obesity. Transferred intubated from [place] with DVT, questionable PE and Small frontal SAH after starting Xarelto.     PT Comments   Pt admitted with SAH, DVT and PE. Had PEA arrest and VDRF. Pt currently with functional limitations due to continued pain secondary to CPR.   Making slow progress but did participate better today.  Pt will benefit from skilled PT to increase their independence and safety with mobility to allow discharge to the venue listed below.   Follow Up Recommendations  SNF                 Equipment Recommendations  None recommended by PT        Frequency Min 3X/week   Progress towards PT Goals Progress towards PT goals: Progressing toward goals  Plan Current plan remains appropriate    Precautions / Restrictions Precautions Precautions: Fall Restrictions Weight Bearing Restrictions: No   Pertinent Vitals/Pain VSS, Severe pain in sternum area.  Pt asked PT to rub muscle rub and PT did so.  Also notified nursing for pain med.    Mobility  Bed Mobility Bed Mobility: Supine to Sit;Sitting - Scoot to Edge of Bed;Sit to Supine Supine to Sit: 3: Mod assist Sitting - Scoot to Edge of Bed: 3: Mod assist Sit to Supine: Not Tested (comment) Details for Bed Mobility Assistance: incr  assist due to chest pain. Pt having a lot of difficulty due to pain from chest compressions.  Needed  use of railand PT assist. Transfers Transfers: Sit to Stand;Stand to Sit;Stand Pivot Transfers Sit to Stand: 4: Min assist;With upper extremity assist;From bed Stand to Sit: 4: Min assist;With upper extremity assist;To chair/3-in-1 Stand Pivot Transfers: 4: Min  assist;3: Mod assist Details for Transfer Assistance: heavy use of arms even though pt c/o pain in arms.  Pt leaning really far forward with flexed trunk almost to 90 degrees with transfer to 3N1 and recliner.  Pt unsafe and not following safety cues secondary to focuson pain.  Needed total assist to clean her bottom.   Ambulation/Gait Ambulation/Gait Assistance: Not tested (comment) Stairs: No Wheelchair Mobility Wheelchair Mobility: No    PT Goals (current goals can now be found in the care plan section) Acute Rehab PT Goals Patient Stated Goal: Get rid of this pain.  Visit Information  Last PT Received On: 01/20/13 Assistance Needed: +2 (for mobility with walker) History of Present Illness: 67 years old female with PMH relevant for DM, HTN, diastolic CHF, PAH, COPD, OSA, obesity. Transferred intubated from [place] with DVT, questionable PE and Small frontal SAH after starting Xarelto.      Subjective Data  Subjective: "I need to get to the potty." Patient Stated Goal: Get rid of this pain.   Cognition  Cognition Arousal/Alertness: Awake/alert Behavior During Therapy: WFL for tasks assessed/performed Overall Cognitive Status: Within Functional Limits for tasks assessed    Balance  Balance Balance Assessed: Yes Static Sitting Balance Static Sitting - Balance Support: Feet supported;No upper extremity supported Static Sitting - Level of Assistance: 7: Independent Static Sitting - Comment/# of Minutes: 2 Static Standing Balance Static Standing - Balance Support: Bilateral upper extremity supported;During functional activity Static Standing - Level of Assistance: 3: Mod assist;4:  Min assist Static Standing - Comment/# of Minutes: 2  End of Session PT - End of Session Equipment Utilized During Treatment: Gait belt;Oxygen Activity Tolerance: Patient limited by pain Patient left: in chair;with call bell/phone within reach Nurse Communication: Mobility status         INGOLD,Naylene Foell 01/20/2013, 1:22 PM [facility] Acute Rehabilitation [phone] (pager)

## 2013-01-20 NOTE — Progress Notes (Signed)
Pt found in chair calling out for help. Patient having full body tremors and R arm rigidly. Patient able to verbalize what was happening during episode and yelling out in pain. Patient aware that RN was in room. Episode lasted 90sec. Post episode patient right arm is weaker than left and R pupil has a sluggish responds.  Dr. Arvella Nigh made aware of the episode.

## 2013-01-20 NOTE — Progress Notes (Signed)
[facility] Progress Note [place] TEAM 1 - Stepdown/ICU TEAM   Anaylah Lupercio [id number] DOB: [birth year] DOA: 01/16/2013 PCP: [name], MD  Brief narrative: This is a 67 year old female with a past medical history of diabetes mellitus, hypertension, chronic congestive heart failure, moderate to severe pulmonary hypertension, obstructive sleep apnea, COPD on 2 L of oxygen at home.  The patient was admitted to [facility] on 01/11/13 with a complaint of chest pain. It was thought to be secondary to chest wall pain/costochondritis. Cardiology consult was requested and they agreed that it was noncardiac in origin. She had a cath in August 2013 which was negative.    A CT of the chest was performedduring the workup for her chest pain and results are as follows "cannot exclude tiny pulmonary embolus in a small segmental branch right lower lobe" and "persistent mild interstitial changes suggestive of pneumonitis or CHF these have improved from prior study in 7/22." She was started on Xarelto for the PE.    She had a PEA arrest on 7/27 at 4:30 PM after 30 seconds of chest compressions she began to wake up but was noted to be confused with slurred speech and was not able to protect her airway and therefore was intubated. Further workup revealed a small subarachnoid hemorrhage and she was therefore transferred to [facility] to be further evaluated by neurosurgery. She has not yet been evaluated by neurosurgery despite a consult request.  Patient was managed in the ICU initially and eventually was able to be extubated. Dr.Yacoub also spoke with radiology and the CT of the chestfrom [place] [facility] was reviewed- it was read to be positive for PE - Her Xarelto has been discontinued due to subarachnoid hemorrhage- she had an IVC filter placed on 7/28- lower extremity duplex was negative for DVT   Assessment/Plan: Principal Problem:   Subarachnoid  hemorrhage -Have spoken with neurosurgery- Dr Annette Stable recommends resuming anticoagulation in 2 wks.  -Continue to hold Xarelto-retrievable IVC filter is in place -patient's mental status is back to baseline  Active Problems:   Pulmonary embolus-subsegmental tiny PE -holding off on Xarelto -IVC filter in place-no DVT noted -doubt the PE has any contribution to her chest pain  Chest pain-Cardiac workup has been negative. Although PE was found on the CT it is too small to be causing her any pain.   (a) Musculoskeletal chest pain- was on Naprosyn and Toradol for this, however, began having pain with swallowing which is likely due to esophagitis therefore I have discontinued Naprosyn and Toradol.Will treat chest wall pain with Aspercreme, heating pad and Vicodin- tried ultram but pt had what looked to be a seizure- as Tramadol decreases seizure threshold, it has been discontinued. Pt should not resume it when discharged.   (b) probable esophagitis-this pain resolved immediately after a GI cocktail with lidocaine was administered . Therefore we'll treat this with Protonix daily, Pepcid twice a day and GI cocktail with lidocaine 3 times a day as needed. She should probably continue this treatment for at least a month.  Seizure - apparently had rolling back of eyes and shaking of body on 7/30- see neuro note - similar episode today but pt was alert and speaking through it and therefore, likely and pseudoseizure    Hypertension -continue current medications -on Coreg    Chronic diastolic heart failure/  Pulmonary hypertension -was given Lasix x 3 doses on 7-29 -echo performed recently at [place] [facility]-need to followup  Positive blood cultures at  [facility] [facility] -apparently pos for strep - Vanc was initially given but d/c'd since cultures obtained here have been negative   Severe COPD on 2 L of oxygen at home -Not having an exacerbation at this time -when necessary albuterol nebulizers     Diabetes mellitus type 2, controlled -Takes metformin at home-Currently on sliding scale insulin with meals    OSA (obstructive sleep apnea) -CPAP at bedtime  Code Status: full code Family Communication: none today-  Disposition Plan: transfer out of the step down unit  Consultants: none  DVT prophylaxis: SCDs  HPI/Subjective: Patient has been complaining of chest pain on and off. It initially improved after a GI cocktail with lidocaine but then recurred after she started eating and with moving around.This is the same pain that she was admitted for at [facility] last week. She has no other complaints   Objective: Blood pressure 102/66, pulse 78, temperature 99 F (37.2 C), temperature source Oral, resp. rate 16, height 5\' 1"  (1.549 m), weight 98.4 kg (216 lb 14.9 oz), SpO2 96.00%.  Intake/Output Summary (Last 24 hours) at 01/20/13 1707 Last data filed at 01/20/13 1130  Gross per 24 hour  Intake    237 ml  Output    925 ml  Net   -688 ml     Exam: General: No acute respiratory distress Lungs: Clear to auscultation bilaterally without wheezes or crackles Cardiovascular: Regular rate and rhythm without murmur gallop or rub normal S1 and S2 Abdomen: Nontender, nondistended, soft, bowel sounds positive, no rebound, no ascites, no appreciable mass Extremities: No significant cyanosis, clubbing, or edema bilateral lower extremities  Data Reviewed: Basic Metabolic Panel:  Recent Labs Lab 01/17/13 0130 01/18/13 0345 01/19/13 0516  NA 131* 134* 134*  K 3.7 3.4* 3.9  CL 91* 92* 92*  CO2 34* 33* 31  GLUCOSE 141* 139* 155*  BUN 35* 30* 28*  CREATININE 0.80 0.84 0.77  CALCIUM 8.9 9.2 9.1  MG 2.6* 2.4 2.0  PHOS 3.3 3.9 4.4   Liver Function Tests:  Recent Labs Lab 01/17/13 0130  AST 49*  ALT 74*  ALKPHOS 105  BILITOT 0.5  PROT 7.1  ALBUMIN 2.7*   No results found for this basename: LIPASE, AMYLASE,  in the last 168 hours No results found for this  basename: AMMONIA,  in the last 168 hours CBC:  Recent Labs Lab 01/17/13 0130 01/18/13 0345 01/19/13 0516  WBC 17.5* 16.0* 16.4*  HGB 10.1* 10.8* 10.5*  HCT 30.0* 32.9* 32.5*  MCV 79.4 80.4 81.3  PLT 184 209 234   Cardiac Enzymes:  Recent Labs Lab 01/20/13 0308 01/20/13 0830  TROPONINI <0.30 <0.30   BNP (last 3 results) No results found for this basename: PROBNP,  in the last 8760 hours CBG:  Recent Labs Lab 01/19/13 1924 01/19/13 2132 01/20/13 0805 01/20/13 1224 01/20/13 1603  GLUCAP 268* 219* 171* 293* 182*    Recent Results (from the past 240 hour(s))  MRSA PCR SCREENING     Status: None   Collection Time    01/16/13 10:54 PM      Result Value Range Status   MRSA by PCR NEGATIVE  NEGATIVE Final   Comment:            [name] (FDA     approved for NASAL specimens     only), is one component of a     comprehensive MRSA colonization     surveillance program. It is not  intended to diagnose MRSA     infection nor to guide or     monitor treatment for     MRSA infections.  CULTURE, BLOOD (ROUTINE X 2)     Status: None   Collection Time    01/17/13 11:00 AM      Result Value Range Status   Specimen Description BLOOD RIGHT HAND   Final   Special Requests     Final   Value: BOTTLES DRAWN AEROBIC AND ANAEROBIC AERO 10CC ANA 3CC   Culture  Setup Time 01/17/2013 17:38   Final   Culture     Final   Value:        BLOOD CULTURE RECEIVED NO GROWTH TO DATE CULTURE WILL BE HELD FOR 5 DAYS BEFORE ISSUING A FINAL NEGATIVE REPORT   Report Status PENDING   Incomplete  CULTURE, BLOOD (ROUTINE X 2)     Status: None   Collection Time    01/17/13 11:10 AM      Result Value Range Status   Specimen Description BLOOD RIGHT HAND   Final   Special Requests BOTTLES DRAWN AEROBIC ONLY 10CC   Final   Culture  Setup Time 01/17/2013 17:38   Final   Culture     Final   Value:        BLOOD CULTURE RECEIVED NO GROWTH TO DATE CULTURE WILL BE HELD FOR 5 DAYS  BEFORE ISSUING A FINAL NEGATIVE REPORT   Report Status PENDING   Incomplete     Studies:  Recent x-ray studies have been reviewed in detail by the Attending Physician  Scheduled Meds:  Scheduled Meds: . antiseptic oral rinse  15 mL Mouth Rinse QID  . carvedilol  3.125 mg Oral BID WC  . chlorhexidine  15 mL Mouth Rinse BID  . famotidine  20 mg Oral BID  . feeding supplement  237 mL Oral TID WC  . insulin aspart  0-20 Units Subcutaneous TID WC  . MUSCLE RUB   Topical TID  . pantoprazole  40 mg Oral Daily   Continuous Infusions:   Time spent on care of this patient:35 minutes   Debbe Odea, MD  [facility] Office  [phone] Pager - Text Page per Shea Evans as per below:  On-Call/Text Page:      [email or web address]      password [email or web address]  If 7PM-7AM, please contact night-coverage [email or web address] Password [email or web address] 01/20/2013, 5:07 PM   LOS: 4 days

## 2013-01-20 NOTE — Progress Notes (Signed)
Infection prevention contacted RN and gave critical Blood Culture results. Results were fax to unit and place in paper chart. Dr. Wynelle Cleveland made aware.

## 2013-01-21 ENCOUNTER — Ambulatory Visit: Payer: Self-pay | Admitting: Internal Medicine

## 2013-01-21 DIAGNOSIS — R079 Chest pain, unspecified: Secondary | ICD-10-CM

## 2013-01-21 DIAGNOSIS — M25569 Pain in unspecified knee: Secondary | ICD-10-CM

## 2013-01-21 LAB — CBC
HCT: 32 % — ABNORMAL LOW (ref 36.0–46.0)
Hemoglobin: 10.6 g/dL — ABNORMAL LOW (ref 12.0–15.0)
MCH: 26.8 pg (ref 26.0–34.0)
MCHC: 33.1 g/dL (ref 30.0–36.0)
MCV: 80.8 fL (ref 78.0–100.0)
Platelets: 309 10*3/uL (ref 150–400)
RBC: 3.96 MIL/uL (ref 3.87–5.11)
RDW: 14.6 % (ref 11.5–15.5)
WBC: 12.9 10*3/uL — ABNORMAL HIGH (ref 4.0–10.5)

## 2013-01-21 LAB — BASIC METABOLIC PANEL
BUN: 24 mg/dL — ABNORMAL HIGH (ref 6–23)
CO2: 30 mEq/L (ref 19–32)
Calcium: 9.4 mg/dL (ref 8.4–10.5)
Chloride: 93 mEq/L — ABNORMAL LOW (ref 96–112)
Creatinine, Ser: 0.73 mg/dL (ref 0.50–1.10)
GFR calc Af Amer: >90 mL/min (ref >90)
GFR calc non Af Amer: 85 mL/min — ABNORMAL LOW (ref >90)
Glucose, Bld: 246 mg/dL — ABNORMAL HIGH (ref 70–99)
Potassium: 4.4 mEq/L (ref 3.5–5.1)
Sodium: 133 mEq/L — ABNORMAL LOW (ref 135–145)

## 2013-01-21 LAB — GLUCOSE, CAPILLARY
Glucose-Capillary: 185 mg/dL — ABNORMAL HIGH (ref 70–99)
Glucose-Capillary: 266 mg/dL — ABNORMAL HIGH (ref 70–99)
Glucose-Capillary: 148 mg/dL — ABNORMAL HIGH (ref 70–99)
Glucose-Capillary: 157 mg/dL — ABNORMAL HIGH (ref 70–99)

## 2013-01-21 MED ORDER — GI COCKTAIL ~~LOC~~
30.0000 mL | Freq: Three times a day (TID) | ORAL | Status: DC | PRN
Start: 2013-01-21 — End: 2013-01-21

## 2013-01-21 MED ORDER — IBUPROFEN 600 MG PO TABS
600.0000 mg | ORAL_TABLET | Freq: Three times a day (TID) | ORAL | Status: AC
Start: 2013-01-21 — End: 2013-01-23
  Administered 2013-01-21 – 2013-01-23 (×6): 600 mg via ORAL
  Filled 2013-01-21 (×7): qty 1

## 2013-01-21 MED ORDER — SODIUM CHLORIDE 0.9 % IV SOLN
1000.0000 mg | INTRAVENOUS | Status: AC
  Administered 2013-01-21: 1000 mg via INTRAVENOUS
  Filled 2013-01-21: qty 20

## 2013-01-21 MED ORDER — PHENYTOIN SODIUM 50 MG/ML IJ SOLN
100.0000 mg | Freq: Three times a day (TID) | INTRAMUSCULAR | Status: DC
  Administered 2013-01-21 – 2013-01-22 (×3): 100 mg via INTRAVENOUS
  Filled 2013-01-21 (×7): qty 2

## 2013-01-21 NOTE — Progress Notes (Signed)
Patient just experienced acute episode of rhythmic jerking motions to right arm, patient intermittently groaning at time, Neurology in at bedside and observed episode.  Seizure activity lasted 2 min.  Patient able to converse after seizure activity.

## 2013-01-21 NOTE — Evaluation (Signed)
Occupational Therapy Evaluation Patient Details Name: Katisha Duddy MRN: [id number] DOB: 1946/04/15 Today's Date: 01/21/2013 Time: [id number] OT Time Calculation (min): 26 min  OT Assessment / Plan / Recommendation History of present illness 67 years old female with PMH relevant for DM, HTN, diastolic CHF, PAH, COPD, OSA, obesity. Transferred intubated from [place] with DVT, questionable PE and Small frontal SAH after starting Xarelto.     Clinical Impression   Pt with seizure like episode during session noted with LUE shaking uncontrollably for approximately 40 seconds.  Pt yelling out in pain during the episode originating from her chest.  Therapist attempted to stabilize the UE but was not successful and it continued with spasm.  Post event pt able to talk, answer questions appropriately but reports some fatigue.  Limited LUE AROM initially after episode as well but improved over a period of 1-2 mins.  Overall limited eval secondary to episode.  Pt sat EOB for 2-3 mins post event with close supervision but needed max assist to achieve sitting from supine position.  Feel pt will benefit from OT to help increased endurance and performance of basic selfcare tasks however pt will need SNF for follow-up therapy as pt will not have 24 hour supervision at discharge.    OT Assessment  Patient needs continued OT Services    Follow Up Recommendations  SNF    Barriers to Discharge Decreased caregiver support    Equipment Recommendations  3 in 1 bedside comode       Frequency  Min 2X/week    Precautions / Restrictions Precautions Precautions: Fall Precaution Comments: Pt with uncontrollable movements in the right arm at times.   Restrictions Weight Bearing Restrictions: No   Pertinent Vitals/Pain Chest pain 8/10 nursing made aware.     ADL  Eating/Feeding: Simulated;Set up Where Assessed - Eating/Feeding: Edge of bed Grooming: Simulated;Set up Where Assessed - Grooming: Unsupported  standing Upper Body Bathing: Simulated;Set up Where Assessed - Upper Body Bathing: Unsupported sitting Transfers/Ambulation Related to ADLs: Did not attempt transfer OOB this session. ADL Comments: Limited eval secondary to pt having a seizure like episode.  During AROM and strength testing of the RUE the UE began to shake violently and uncontrollably for 40 seconds whhile pt yelled out in pain.  Therapist unable to stabilize UE and get it to stop shaking.  Event lasted for 40 seconds and pts nurse cam in to witness part of the episode.  After this pt reporting fatigue and therapist limited session to pt sitting on EOB for 2-3 mins.       OT Diagnosis: Generalized weakness;Acute pain  OT Problem List: Decreased strength;Decreased activity tolerance;Impaired balance (sitting and/or standing);Decreased knowledge of use of DME or AE;Increased edema;Cardiopulmonary status limiting activity;Pain OT Treatment Interventions: Self-care/ADL training;Therapeutic exercise;Neuromuscular education;Energy conservation;DME and/or AE instruction;Therapeutic activities;Balance training   OT Goals(Current goals can be found in the care plan section) Acute Rehab OT Goals Patient Stated Goal: Get rid of this pain. OT Goal Formulation: With patient/family Time For Goal Achievement: 02/04/13 Potential to Achieve Goals: Good ADL Goals Pt Will Perform Grooming: with min assist;standing (3 tasks) Pt Will Perform Lower Body Bathing: with min assist;with adaptive equipment;sit to/from stand Pt Will Perform Lower Body Dressing: with min assist;with adaptive equipment;sit to/from stand Pt Will Transfer to Toilet: with min assist;bedside commode;ambulating Pt Will Perform Toileting - Clothing Manipulation and hygiene: with min assist;sit to/from stand Additional ADL Goal #1: Pt will transfer supine to sit EOB with supervision in preparation for selfcare tasks.  Visit Information  Last OT Received On:  01/21/13 Assistance Needed: +2 History of Present Illness: 67 years old female with PMH relevant for DM, HTN, diastolic CHF, PAH, COPD, OSA, obesity. Transferred intubated from [place] with DVT, questionable PE and Small frontal SAH after starting Xarelto.         Prior [place] expects to be discharged to:: Private residence Living Arrangements: Children Available Help at Discharge: Available PRN/intermittently Type of Home: House Home Access: Stairs to enter Technical brewer of Steps: 5 Entrance Stairs-Rails:  (broken rails) Home Layout: One level Home Equipment: Environmental consultant - 2 wheels Prior Function Level of Independence: Independent Communication Communication: No difficulties         Vision/Perception Vision - History Baseline Vision: No visual deficits Patient Visual Report: No change from baseline Vision - Assessment Eye Alignment: Within Functional Limits Vision Assessment: Vision not tested Perception Perception: Within Functional Limits Praxis Praxis: Intact   Cognition  Cognition Arousal/Alertness: Awake/alert Behavior During Therapy: WFL for tasks assessed/performed Overall Cognitive Status: Within Functional Limits for tasks assessed    Extremity/Trunk Assessment Upper Extremity Assessment Upper Extremity Assessment: RUE deficits/detail RUE Deficits / Details: Pt with general weakness in the RUE with strength 3+/5.  Noted increased edema in the left elbow secodnary to IV infiltration.  During ROM/ strength testing note pt's RUE shaking uncontrollably for 40 seconds and pt yelling out in pain.  Pt unable to perform AROM initially but improved within a minute or so.   Lower Extremity Assessment Lower Extremity Assessment: Defer to PT evaluation Cervical / Trunk Assessment Cervical / Trunk Assessment: Normal     Mobility Bed Mobility Bed Mobility: Supine to Sit;Sit to Supine Supine to Sit: 2: Max assist;HOB  elevated Sit to Supine: 2: Max assist Details for Bed Mobility Assistance: Pt unable to transition UB iinto sitting position from supine secondary to chest pain.        Balance Static Sitting Balance Static Sitting - Balance Support: Right upper extremity supported;Left upper extremity supported Static Sitting - Level of Assistance: 5: Stand by assistance   End of Session OT - End of Session Activity Tolerance: Patient limited by fatigue;Patient limited by pain Patient left: in bed;with call bell/phone within reach;with family/visitor present Nurse Communication: Other (comment) (questionable seizure activity)     Norwin Aleman OTR/L Pager number [phone] 01/21/2013, 3:14 PM

## 2013-01-21 NOTE — Progress Notes (Addendum)
NEURO HOSPITALIST PROGRESS NOTE   SUBJECTIVE:                                                                                                                        Per chart yesterday "Patient having full body tremors and R arm rigidly. Patient able to verbalize what was happening during episode and yelling out in pain. Patient aware that RN was in room. Episode lasted 90sec." Today patient is awake and oriented, main complaint is chest discomfort when she moves her arms.  She gives poor effort with exam and is focusing on her chest musculoskeletal pain.    OBJECTIVE:                                                                                                                           Vital signs in last 24 hours: Temp:  [98.8 F (37.1 C)-99 F (37.2 C)] 98.8 F (37.1 C) (07/31 2123) Pulse Rate:  [76-83] 78 (07/31 2123) Resp:  [16-24] 22 (07/31 2123) BP: (102-141)/(66-83) 141/83 mmHg (07/31 2123) SpO2:  [91 %-96 %] 91 % (07/31 2123)  Intake/Output from previous day: 07/31 0701 - 08/01 0700 In: 587 [P.O.:587] Out: 525 [Urine:525] Intake/Output this shift: Total I/O In: 50 [P.O.:50] Out: -  Nutritional status: Carb Control  Past Medical History  Diagnosis Date  . Thyroid disorder   . Diabetes mellitus     type II  . Thyroid nodule   . Sleep apnea   . Obesity   . Hypertension   . Hyperlipidemia   . Urinary incontinence   . CHF (congestive heart failure) 2005    Dx at [facility]  . Chronic back pain   . COPD (chronic obstructive pulmonary disease)     2L   . Sleep apnea   . Lipoma of back   . Arthritis     Neurologic ROS negative with exception of above. Musculoskeletal ROS thoracic discomfort with arm movements.   Neurologic Exam:  Mental Status: Alert, oriented, thought content appropriate.  Speech fluent without evidence of aphasia.  Able to follow 3 step commands without difficulty. Cranial Nerves: II: Discs flat  bilaterally; Visual fields grossly normal, pupils equal, round, reactive to light and accommodation III,IV, VI: ptosis not  present, extra-ocular motions intact bilaterally V,VII: smile symmetric, facial light touch sensation normal bilaterally VIII: hearing normal bilaterally IX,X: gag reflex present XI: bilateral shoulder shrug XII: midline tongue extension Motor: Right : Upper extremity   5/5    Left:     Upper extremity   5/5  Lower extremity   5/5     Lower extremity   5/5 --shows decreased effort when resistance testing done due to pain. Tone and bulk:normal tone throughout; no atrophy noted Sensory: Pinprick and light touch intact throughout, bilaterally Deep Tendon Reflexes:  Right: Upper Extremity   Left: Upper extremity   biceps (C-5 to C-6) 2/4   biceps (C-5 to C-6) 2/4 tricep (C7) 2/4    triceps (C7) 2/4 Brachioradialis (C6) 2/4  Brachioradialis (C6) 2/4  Lower Extremity Lower Extremity  quadriceps (L-2 to L-4) 2/4   quadriceps (L-2 to L-4) 2/4 Achilles (S1) 2/4   Achilles (S1) 2/4  Plantars: Right: downgoing   Left: downgoing    Lab Results: Lab Results  Component Value Date/Time   CHOL 150 03/30/2012 10:23 AM   Lipid Panel No results found for this basename: CHOL, TRIG, HDL, CHOLHDL, VLDL, LDLCALC,  in the last 72 hours  Studies/Results: Ct Head Wo Contrast  01/19/2013   *RADIOLOGY REPORT*  Clinical Data: Subarachnoid hemorrhage and seizure.  CT HEAD WITHOUT CONTRAST  Technique:  Contiguous axial images were obtained from the base of the skull through the vertex without contrast.  Comparison: 01/18/2013  Findings: There is barely any residual blood visualized in the high right vertex.  There is no evidence of intraventricular blood, hydrocephalus, mass effect, abnormal edema, infarction or subdural blood.  The skull is unremarkable.  IMPRESSION: Minimal residual visualized subarachnoid blood at the high right posterior vertex.  No interval new findings.   Original  Report Authenticated By: Aletta Edouard, M.D.    MEDICATIONS                                                                                                                        Scheduled: . antiseptic oral rinse  15 mL Mouth Rinse QID  . carvedilol  3.125 mg Oral BID WC  . chlorhexidine  15 mL Mouth Rinse BID  . famotidine  20 mg Oral BID  . feeding supplement  237 mL Oral TID WC  . insulin aspart  0-20 Units Subcutaneous TID WC  . MUSCLE RUB   Topical TID  . pantoprazole  40 mg Oral Daily    ASSESSMENT/PLAN:  Patient had episode last night of full body tremor with right arm rigidity.  This was accompanied by no loss of consciousness and ability to converse with nurse during event. With [facility] on the right and right arm rigidity corillation does not match clinically. EEG is pending at this time.    Recommend: 1) No AED at this time 2) Awaiting EEG for further recommendations.     Assessment and plan discussed with with attending physician and they are in agreement.    Etta Quill PA-C [facility] [phone]  01/21/2013, 8:45 AM   Addendum: At 10:29 patient yelled out help and was noted to have rhythmic flexion of her right arm, breathing heavily, eyes deviated to the left.  Patient would not look over at my voice but did answer one question. This lasted for about 30 sec. and then stopped on its own. patient did not have incontinence and was able to tell me she just hasd a seizure after episode. We will load with Dilantin 1 gram IV and start maintanance dose 100 mg IV TID.  We will follow dilantin level in AM.

## 2013-01-21 NOTE — Progress Notes (Signed)
Writer heard patient yelling and came to room. Patient having Occupational Therapy at the time. Therapist stated while he was lifting patient's rt arm, her arm started shaking which lasted 20 sec. Not witnessed by Probation officer. Patient is responsive at this time and continuing therapy. Will continue to monitor.

## 2013-01-21 NOTE — Clinical Social Work Psychosocial (Signed)
Clinical Social Work Department BRIEF PSYCHOSOCIAL ASSESSMENT 01/21/2013  Patient:  Debra Saunders, Debra Saunders     Account Number:  [id number]     Admit date:  01/16/2013  Clinical Social Worker:  Donna Christen  Date/Time:  01/21/2013 03:12 PM  Referred by:  Physician  Date Referred:  01/21/2013 Referred for  SNF Placement   Other Referral:   none.   Interview type:  Patient Other interview type:   none.    PSYCHOSOCIAL DATA Living Status:  WITH ADULT CHILDREN Admitted from facility:   Level of care:   Primary support name:   Primary support relationship to patient:  CHILD, ADULT Degree of support available:   Adequate degree of support from pt's sister and pt's daughter.    CURRENT CONCERNS Current Concerns  None Noted   Other Concerns:   none.    SOCIAL WORK ASSESSMENT / PLAN CSW met with pt and pt's family at bedside. Pt's daughter and pt's sister were present in the room. Pt requested that pt's family stay in the room for the discussion of discharge planning. Pt stated that they were unaware of SNF recommendation, but were understanding and agreeable to going to SNF upon discharge. Pt noted that prior to admission into [facility], she was living at home with her daughter. Pt stated that her daughter works 8-hour shifts everyday and would not be home to care for pt upon discharge. Pt stated that she would like to see what options she had with SNF placement. CSW faxed information for pre-authorization for pt's insurance to [facility]. CSW to continue to follow and assist with discharge needs.   Assessment/plan status:  Psychosocial Support/Ongoing Assessment of Needs Other assessment/ plan:   none.   Information/referral to community resources:   none.    PATIENTS/FAMILYS RESPONSE TO PLAN OF CARE: Pt and pt's family understanding and agreeable to SNF placement upon discharge.       Pati Gallo, MSW, [place] Social Work [phone]

## 2013-01-21 NOTE — Progress Notes (Signed)
[facility] Progress Note [place] TEAM 1 - Stepdown/ICU TEAM   Yaslin Getz [id number] DOB: [birth year] DOA: 01/16/2013 PCP: [name], MD  Brief narrative: This is a 67 year old female with a past medical history of diabetes mellitus, hypertension, chronic congestive heart failure, moderate to severe pulmonary hypertension, obstructive sleep apnea, COPD on 2 L of oxygen at home.  The patient was admitted to [facility] on 01/11/13 with a complaint of chest pain. It was thought to be secondary to chest wall pain/costochondritis. Cardiology consult was requested and they agreed that it was noncardiac in origin. She had a cath in August 2013 which was negative.    A CT of the chest was performedduring the workup for her chest pain and results are as follows "cannot exclude tiny pulmonary embolus in a small segmental branch right lower lobe" and "persistent mild interstitial changes suggestive of pneumonitis or CHF these have improved from prior study in 7/22." She was started on Xarelto for the PE.    She had a PEA arrest on 7/27 at 4:30 PM after 30 seconds of chest compressions she began to wake up but was noted to be confused with slurred speech and was not able to protect her airway and therefore was intubated. Further workup revealed a small subarachnoid hemorrhage and she was therefore transferred to [facility] to be further evaluated by neurosurgery. She has not yet been evaluated by neurosurgery despite a consult request.  Patient was managed in the ICU initially and eventually was able to be extubated. Dr.Yacoub also spoke with radiology and the CT of the chestfrom [place] [facility] was reviewed- it was read to be positive for PE - Her Xarelto has been discontinued due to subarachnoid hemorrhage- she had an IVC filter placed on 7/28- lower extremity duplex was negative for DVT   Assessment/Plan: Principal Problem:   Subarachnoid  hemorrhage -Dr Wynelle Cleveland spoke with neurosurgery- Dr Annette Stable recommends resuming anticoagulation in 2 wks.  -Continue to hold Xarelto-retrievable IVC filter is in place -patient's mental status is back to baseline  Active Problems:   Pulmonary embolus-subsegmental tiny PE -holding off on Xarelto -IVC filter in place-no DVT noted -doubt the PE has any contribution to her chest pain  Chest pain-Cardiac workup has been negative. Although PE was found on the CT it is too small to be causing her any pain.   (a) Musculoskeletal chest pain- was on Naprosyn and Toradol for this, however, began having pain with swallowing which is likely due to esophagitis therefore I have discontinued Naprosyn and Toradol.Will treat chest wall pain with Aspercreme, heating pad and Vicodin- tried ultram but pt had what looked to be a seizure- as Tramadol decreases seizure threshold, it has been discontinued. Pt should not resume it when discharged.   (b) probable esophagitis-this pain resolved immediately after a GI cocktail with lidocaine was administered . Therefore will treat this with Protonix daily, Pepcid twice a day and GI cocktail with lidocaine 3 times a day as needed. She should probably continue this treatment for at least a month.  Seizure - apparently had rolling back of eyes and shaking of body on 7/30 as well as today 01/21/2013.- see neuro note - similar episode today but pt was alert and speaking through it and therefore, likely and pseudoseizure. EEG pending. Neurology following.    Hypertension -continue current medications -on Coreg    Chronic diastolic heart failure/  Pulmonary hypertension -was given Lasix x 3 doses on 7-29 -echo performed recently  at [place] [facility]-need to followup  Positive blood cultures at [place] [facility] -apparently pos for strep - Vanc was initially given but d/c'd since cultures obtained here have been negative   Severe COPD on 2 L of oxygen at home -Not having an  exacerbation at this time -when necessary albuterol nebulizers    Diabetes mellitus type 2, controlled -Takes metformin at home-Currently on sliding scale insulin with meals    OSA (obstructive sleep apnea) -CPAP at bedtime  Code Status: full code Family Communication: none today-  Disposition Plan: SNF when medically stable.  Consultants: none  DVT prophylaxis: SCDs  HPI/Subjective: Patient has been complaining of chest pain on and off. It initially improved after a GI cocktail with lidocaine but then recurred after she started eating and with moving around.This is the same pain that she was admitted for at [facility] last week.  Patient also with seizure in the room per nursing however patient was alert during the episode of yelling out in pain.   Objective: Blood pressure 141/84, pulse 67, temperature 99 F (37.2 C), temperature source Oral, resp. rate 18, height 5\' 1"  (1.549 m), weight 98.4 kg (216 lb 14.9 oz), SpO2 96.00%.  Intake/Output Summary (Last 24 hours) at 01/21/13 1623 Last data filed at 01/21/13 1101  Gross per 24 hour  Intake    670 ml  Output      0 ml  Net    670 ml     Exam: General: No acute respiratory distress Lungs: Clear to auscultation bilaterally without wheezes or crackles Cardiovascular: Regular rate and rhythm without murmur gallop or rub normal S1 and S2 Abdomen: Nontender, nondistended, soft, bowel sounds positive, no rebound, no ascites, no appreciable mass Extremities: No significant cyanosis, clubbing, or edema bilateral lower extremities  Data Reviewed: Basic Metabolic Panel:  Recent Labs Lab 01/17/13 0130 01/18/13 0345 01/19/13 0516 01/21/13 0800  NA 131* 134* 134* 133*  K 3.7 3.4* 3.9 4.4  CL 91* 92* 92* 93*  CO2 34* 33* 31 30  GLUCOSE 141* 139* 155* 246*  BUN 35* 30* 28* 24*  CREATININE 0.80 0.84 0.77 0.73  CALCIUM 8.9 9.2 9.1 9.4  MG 2.6* 2.4 2.0  --   PHOS 3.3 3.9 4.4  --    Liver Function Tests:  Recent  Labs Lab 01/17/13 0130  AST 49*  ALT 74*  ALKPHOS 105  BILITOT 0.5  PROT 7.1  ALBUMIN 2.7*   No results found for this basename: LIPASE, AMYLASE,  in the last 168 hours No results found for this basename: AMMONIA,  in the last 168 hours CBC:  Recent Labs Lab 01/17/13 0130 01/18/13 0345 01/19/13 0516 01/21/13 0800  WBC 17.5* 16.0* 16.4* 12.9*  HGB 10.1* 10.8* 10.5* 10.6*  HCT 30.0* 32.9* 32.5* 32.0*  MCV 79.4 80.4 81.3 80.8  PLT 184 209 234 309   Cardiac Enzymes:  Recent Labs Lab 01/20/13 0308 01/20/13 0830  TROPONINI <0.30 <0.30   BNP (last 3 results) No results found for this basename: PROBNP,  in the last 8760 hours CBG:  Recent Labs Lab 01/20/13 1224 01/20/13 1603 01/20/13 2154 01/21/13 0638 01/21/13 1115  GLUCAP 293* 182* 242* 185* 266*    Recent Results (from the past 240 hour(s))  MRSA PCR SCREENING     Status: None   Collection Time    01/16/13 10:54 PM      Result Value Range Status   MRSA by PCR NEGATIVE  NEGATIVE Final   Comment:  [name] (FDA     approved for NASAL specimens     only), is one component of a     comprehensive MRSA colonization     surveillance program. It is not     intended to diagnose MRSA     infection nor to guide or     monitor treatment for     MRSA infections.  CULTURE, BLOOD (ROUTINE X 2)     Status: None   Collection Time    01/17/13 11:00 AM      Result Value Range Status   Specimen Description BLOOD RIGHT HAND   Final   Special Requests     Final   Value: BOTTLES DRAWN AEROBIC AND ANAEROBIC AERO 10CC ANA 3CC   Culture  Setup Time 01/17/2013 17:38   Final   Culture     Final   Value:        BLOOD CULTURE RECEIVED NO GROWTH TO DATE CULTURE WILL BE HELD FOR 5 DAYS BEFORE ISSUING A FINAL NEGATIVE REPORT   Report Status PENDING   Incomplete  CULTURE, BLOOD (ROUTINE X 2)     Status: None   Collection Time    01/17/13 11:10 AM      Result Value Range Status   Specimen Description  BLOOD RIGHT HAND   Final   Special Requests BOTTLES DRAWN AEROBIC ONLY 10CC   Final   Culture  Setup Time 01/17/2013 17:38   Final   Culture     Final   Value:        BLOOD CULTURE RECEIVED NO GROWTH TO DATE CULTURE WILL BE HELD FOR 5 DAYS BEFORE ISSUING A FINAL NEGATIVE REPORT   Report Status PENDING   Incomplete     Studies:  Recent x-ray studies have been reviewed in detail by the Attending Physician  Scheduled Meds:  Scheduled Meds: . antiseptic oral rinse  15 mL Mouth Rinse QID  . carvedilol  3.125 mg Oral BID WC  . chlorhexidine  15 mL Mouth Rinse BID  . famotidine  20 mg Oral BID  . feeding supplement  237 mL Oral TID WC  . insulin aspart  0-20 Units Subcutaneous TID WC  . MUSCLE RUB   Topical TID  . pantoprazole  40 mg Oral Daily  . phenytoin (DILANTIN) IV  100 mg Intravenous Q8H   Continuous Infusions:   Time spent on care of this patient:35 minutes   THOMPSON,DANIEL, MD [place]  [phone] Pager - Text Page per Shea Evans as per below:  On-Call/Text Page:      [email or web address]      password [email or web address]  If 7PM-7AM, please contact night-coverage [email or web address] Password [email or web address] 01/21/2013, 4:23 PM   LOS: 5 days

## 2013-01-21 NOTE — Progress Notes (Signed)
IV infiltrated, hot pack placed over PIV access.  Patient reports minimal pain at IV site.  IV checked prior to infusion, blood return noted and flushed without difficulity

## 2013-01-21 NOTE — Progress Notes (Signed)
Inpatient Diabetes Program Recommendations  AACE/ADA: New Consensus Statement on Inpatient Glycemic Control (2013)  Target Ranges:  Prepandial:   less than 140 mg/dL      Peak postprandial:   less than 180 mg/dL (1-2 hours)      Critically ill patients:  140 - 180 mg/dL   Reason for Visit: Hyperglycemia  Results for Debra Saunders, Debra Saunders (MRN [id number]) as of 01/21/2013 13:25  Ref. Range 01/20/2013 08:05 01/20/2013 12:24 01/20/2013 16:03 01/20/2013 21:54 01/21/2013 06:38 01/21/2013 11:15  Glucose-Capillary Latest Range: 70-99 mg/dL 171 (H) 293 (H) 182 (H) 242 (H) 185 (H) 266 (H)    Inpatient Diabetes Program Recommendations Insulin - Basal: Add Lantus 10 units QHS Correction (SSI): Add HS correction Insulin - Meal Coverage: Will need meal coverage insulin when po intake increases - Novolog 4 units tidwc  Note: Will follow.  Thank you. Lorenda Peck, RD, LDN, CDE Inpatient Diabetes Coordinator [phone]

## 2013-01-21 NOTE — Clinical Social Work Placement (Addendum)
Clinical Social Work Department CLINICAL SOCIAL WORK PLACEMENT NOTE 01/24/2013  Patient:  Debra Saunders, Debra Saunders  Account Number:  [id number] Admit date:  01/16/2013  Clinical Social Worker:  [name] Sarna [place], LCSWA  Date/time:  01/21/2013 03:59 PM  Clinical Social Work is seeking post-discharge placement for this patient at the following level of care:   SKILLED NURSING   (*CSW will update this form in Epic as items are completed)   01/21/2013  Patient/family provided with [facility] Department of Clinical Social Works list of facilities offering this level of care within the geographic area requested by the patient (or if unable, by the patients family).  01/21/2013  Patient/family informed of their freedom to choose among providers that offer the needed level of care, that participate in Medicare, Medicaid or managed care program needed by the patient, have an available bed and are willing to accept the patient.  01/21/2013  Patient/family informed of [facility] ownership interest in [facility], as well as of the fact that they are under no obligation to receive care at this facility.  PASARR submitted to EDS on 01/21/2013 PASARR number received from EDS on 01/21/2013  FL2 transmitted to all facilities in geographic area requested by pt/family on  01/21/2013 FL2 transmitted to all facilities within larger geographic area on   Patient informed that his/her managed care company has contracts with or will negotiate with  certain facilities, including the following:     Patient/family informed of bed offers received:  01/24/2013 Patient chooses bed at  Physician recommends and patient chooses bed at    Patient to be transferred to  on   Patient to be transferred to facility by   The following physician request were entered in Epic:   Additional Comments:    Pati Gallo, MSW, [place] Work [phone]

## 2013-01-22 ENCOUNTER — Inpatient Hospital Stay (HOSPITAL_COMMUNITY): Payer: Medicare Other

## 2013-01-22 LAB — BASIC METABOLIC PANEL
BUN: 17 mg/dL (ref 6–23)
CO2: 30 mEq/L (ref 19–32)
Calcium: 9.2 mg/dL (ref 8.4–10.5)
Chloride: 94 mEq/L — ABNORMAL LOW (ref 96–112)
Creatinine, Ser: 0.77 mg/dL (ref 0.50–1.10)
GFR calc Af Amer: >90 mL/min (ref >90)
GFR calc non Af Amer: 84 mL/min — ABNORMAL LOW (ref >90)
Glucose, Bld: 174 mg/dL — ABNORMAL HIGH (ref 70–99)
Potassium: 3.6 mEq/L (ref 3.5–5.1)
Sodium: 134 mEq/L — ABNORMAL LOW (ref 135–145)

## 2013-01-22 LAB — GLUCOSE, CAPILLARY
Glucose-Capillary: 166 mg/dL — ABNORMAL HIGH (ref 70–99)
Glucose-Capillary: 212 mg/dL — ABNORMAL HIGH (ref 70–99)
Glucose-Capillary: 248 mg/dL — ABNORMAL HIGH (ref 70–99)
Glucose-Capillary: 238 mg/dL — ABNORMAL HIGH (ref 70–99)

## 2013-01-22 LAB — ALBUMIN: Albumin: 2.3 g/dL — ABNORMAL LOW (ref 3.5–5.2)

## 2013-01-22 LAB — PHENYTOIN LEVEL, TOTAL: Phenytoin Lvl: 3.7 ug/mL — ABNORMAL LOW (ref 10.0–20.0)

## 2013-01-22 MED ORDER — AMLODIPINE BESYLATE 5 MG PO TABS
5.0000 mg | ORAL_TABLET | Freq: Every day | ORAL | Status: DC
  Administered 2013-01-22 – 2013-01-27 (×6): 5 mg via ORAL
  Filled 2013-01-22 (×7): qty 1

## 2013-01-22 MED ORDER — SODIUM CHLORIDE 0.9 % IV SOLN
500.0000 mg | Freq: Once | INTRAVENOUS | Status: AC
  Administered 2013-01-22: 500 mg via INTRAVENOUS
  Filled 2013-01-22: qty 10

## 2013-01-22 MED ORDER — SODIUM CHLORIDE 0.9 % IV SOLN
150.0000 mg | Freq: Three times a day (TID) | INTRAVENOUS | Status: DC
  Administered 2013-01-22 – 2013-01-23 (×2): 150 mg via INTRAVENOUS
  Filled 2013-01-22 (×6): qty 3

## 2013-01-22 MED ORDER — ENALAPRIL MALEATE 20 MG PO TABS
20.0000 mg | ORAL_TABLET | Freq: Two times a day (BID) | ORAL | Status: DC
  Administered 2013-01-22 – 2013-01-27 (×10): 20 mg via ORAL
  Filled 2013-01-22 (×11): qty 1

## 2013-01-22 NOTE — Progress Notes (Signed)
Placed patient on CPAP at 6cm as per home settings. Oxygen set at 2lpm with SP02=95%

## 2013-01-22 NOTE — Procedures (Signed)
ELECTROENCEPHALOGRAM REPORT   Patient: Debra Saunders       Room #: [id number]  Age: 67 y.o.        Sex: female Referring Physician: Grandville Silos Report Date:  01/22/2013        Interpreting Physician: Alexis Goodell D  History: [name] is an 67 y.o. female with episodes of RUE clonic activity and recent SAH  Medications:  Scheduled: . amLODipine  5 mg Oral Daily  . antiseptic oral rinse  15 mL Mouth Rinse QID  . carvedilol  3.125 mg Oral BID WC  . chlorhexidine  15 mL Mouth Rinse BID  . enalapril  20 mg Oral BID  . famotidine  20 mg Oral BID  . feeding supplement  237 mL Oral TID WC  . ibuprofen  600 mg Oral TID  . insulin aspart  0-20 Units Subcutaneous TID WC  . MUSCLE RUB   Topical TID  . pantoprazole  40 mg Oral Daily  . phenytoin (DILANTIN) IV  150 mg Intravenous Q8H    Conditions of Recording:  This is a 16 channel EEG carried out with the patient in the awake, drowsy and asleep states.  Description:  The waking background activity consists of a low voltage, symmetrical, fairly well organized, 9-10 Hz alpha activity, seen from the parieto-occipital and posterior temporal regions.  Low voltage fast activity, poorly organized, is seen anteriorly and is at times superimposed on more posterior regions.  A mixture of theta and alpha rhythms are seen from the central and temporal regions. During drowse there are periods of underlying moderate to high voltage polymorphic delta activity that slowed the background.  The patient goes in to a light sleep with symmetrical sleep spindles, vertex central sharp transients and irregular slow activity.   Hyperventilation and intermittent photic stimulation were not performed.  IMPRESSION: This is a normal EEG   Alexis Goodell, MD [facility] [phone] 01/22/2013, 6:54 PM

## 2013-01-22 NOTE — Progress Notes (Signed)
EEG Completed; Results Pending  

## 2013-01-22 NOTE — Progress Notes (Signed)
[facility] Progress Note [place] TEAM 1 - Stepdown/ICU TEAM   Debra Saunders [id number] DOB: Aug 30, 1945 DOA: 01/16/2013 PCP: Debra Mast, MD  Brief narrative: This is a 67 year old female with a past medical history of diabetes mellitus, hypertension, chronic congestive heart failure, moderate to severe pulmonary hypertension, obstructive sleep apnea, COPD on 2 L of oxygen at home.  The patient was admitted to [facility] on 01/11/13 with a complaint of chest pain. It was thought to be secondary to chest wall pain/costochondritis. Cardiology consult was requested and they agreed that it was noncardiac in origin. She had a cath in August 2013 which was negative.    A CT of the chest was performedduring the workup for her chest pain and results are as follows "cannot exclude tiny pulmonary embolus in a small segmental branch right lower lobe" and "persistent mild interstitial changes suggestive of pneumonitis or CHF these have improved from prior study in 7/22." She was started on Xarelto for the PE.    She had a PEA arrest on 7/27 at 4:30 PM after 30 seconds of chest compressions she began to wake up but was noted to be confused with slurred speech and was not able to protect her airway and therefore was intubated. Further workup revealed a small subarachnoid hemorrhage and she was therefore transferred to [facility] to be further evaluated by neurosurgery. She has not yet been evaluated by neurosurgery despite a consult request.  Patient was managed in the ICU initially and eventually was able to be extubated. Dr.Yacoub also spoke with radiology and the CT of the chestfrom [place] [facility] was reviewed- it was read to be positive for PE - Her Xarelto has been discontinued due to subarachnoid hemorrhage- she had an IVC filter placed on 7/28- lower extremity duplex was negative for DVT   Assessment/Plan: Principal Problem:   Subarachnoid  hemorrhage -Dr Debra Saunders spoke with neurosurgery- Dr Debra Saunders recommends resuming anticoagulation in 2 wks.  -Continue to hold Xarelto-retrievable IVC filter is in place -patient's mental status is back to baseline  Active Problems:   Pulmonary embolus-subsegmental tiny PE -holding off on Xarelto -IVC filter in place-no DVT noted -doubt the PE has any contribution to her chest pain  Chest pain-Cardiac workup has been negative. Although PE was found on the CT it is too small to be causing her any pain.   (a) Musculoskeletal chest pain- was on Naprosyn and Toradol for this, however, began having pain with swallowing which is likely due to esophagitis therefore I have discontinued Naprosyn and Toradol.Will treat chest wall pain with Aspercreme, heating pad and Vicodin- tried ultram but pt had what looked to be a seizure- as Tramadol decreases seizure threshold, it has been discontinued. Pt should not resume it when discharged. Continue scheduled ibuprofen.  (b) probable esophagitis-this pain resolved immediately after a GI cocktail with lidocaine was administered . Therefore will treat this with Protonix daily, Pepcid twice a day and GI cocktail with lidocaine 3 times a day as needed. She should probably continue this treatment for at least a month.  Seizure - apparently had rolling back of eyes and shaking of body on 7/30 as well as today 01/21/2013.- see neuro note - similar episode yesterday, but pt was alert and speaking through it. Patient noted per neurology to have a seizure. Patient started on dilantin per neuro.. EEG pending. Neurology following.    Hypertension -continue current medications -on Coreg. Will resume home dose norvasc and enalapril.  Chronic diastolic heart failure/  Pulmonary hypertension -was given Lasix x 3 doses on 7-29 -echo performed recently at [place] [facility]-need to followup  Positive blood cultures at [place] [facility] -apparently pos for strep - Vanc was  initially given but d/c'd since cultures obtained here have been negative   Severe COPD on 2 L of oxygen at home -Not having an exacerbation at this time -when necessary albuterol nebulizers    Diabetes mellitus type 2, controlled -Takes metformin at home-Currently on sliding scale insulin with meals    OSA (obstructive sleep apnea) -CPAP at bedtime  Code Status: full code Family Communication: none today-  Disposition Plan: SNF when medically Saunders.  Consultants: none  DVT prophylaxis: SCDs  HPI/Subjective: Patient states chest pain on and off and slowly improving. It initially improved after a GI cocktail with lidocaine but then recurred after she started eating and with moving around.This is the same pain that she was admitted for at [facility] last week.  Patient also with seizure in the room per nursing yesterday, however patient was alert during the episode of yelling out in pain. Patient with  No observed seizures today.   Objective: Blood pressure 165/72, pulse 68, temperature 98.9 F (37.2 C), temperature source Oral, resp. rate 20, height 5\' 1"  (1.549 m), weight 98.4 kg (216 lb 14.9 oz), SpO2 95.00%. No intake or output data in the 24 hours ending 01/22/13 1503   Exam: General: No acute respiratory distress Lungs: Clear to auscultation bilaterally without wheezes or crackles Cardiovascular: Regular rate and rhythm without murmur gallop or rub normal S1 and S2 Abdomen: Nontender, nondistended, soft, bowel sounds positive, no rebound, no ascites, no appreciable mass Extremities: No significant cyanosis, clubbing, or edema bilateral lower extremities  Data Reviewed: Basic Metabolic Panel:  Recent Labs Lab 01/17/13 0130 01/18/13 0345 01/19/13 0516 01/21/13 0800 01/22/13 0525  NA 131* 134* 134* 133* 134*  K 3.7 3.4* 3.9 4.4 3.6  CL 91* 92* 92* 93* 94*  CO2 34* 33* 31 30 30   GLUCOSE 141* 139* 155* 246* 174*  BUN 35* 30* 28* 24* 17  CREATININE 0.80  0.84 0.77 0.73 0.77  CALCIUM 8.9 9.2 9.1 9.4 9.2  MG 2.6* 2.4 2.0  --   --   PHOS 3.3 3.9 4.4  --   --    Liver Function Tests:  Recent Labs Lab 01/17/13 0130 01/22/13 0525  AST 49*  --   ALT 74*  --   ALKPHOS 105  --   BILITOT 0.5  --   PROT 7.1  --   ALBUMIN 2.7* 2.3*   No results found for this basename: LIPASE, AMYLASE,  in the last 168 hours No results found for this basename: AMMONIA,  in the last 168 hours CBC:  Recent Labs Lab 01/17/13 0130 01/18/13 0345 01/19/13 0516 01/21/13 0800  WBC 17.5* 16.0* 16.4* 12.9*  HGB 10.1* 10.8* 10.5* 10.6*  HCT 30.0* 32.9* 32.5* 32.0*  MCV 79.4 80.4 81.3 80.8  PLT 184 209 234 309   Cardiac Enzymes:  Recent Labs Lab 01/20/13 0308 01/20/13 0830  TROPONINI <0.30 <0.30   BNP (last 3 results) No results found for this basename: PROBNP,  in the last 8760 hours CBG:  Recent Labs Lab 01/21/13 1115 01/21/13 1647 01/21/13 2128 01/22/13 0647 01/22/13 1139  GLUCAP 266* 148* 157* 166* 212*    Recent Results (from the past 240 hour(s))  MRSA PCR SCREENING     Status: None   Collection Time  01/16/13 10:54 PM      Result Value Range Status   MRSA by PCR NEGATIVE  NEGATIVE Final   Comment:            [name] (FDA     approved for NASAL specimens     only), is one component of a     comprehensive MRSA colonization     surveillance program. It is not     intended to diagnose MRSA     infection nor to guide or     monitor treatment for     MRSA infections.  CULTURE, BLOOD (ROUTINE X 2)     Status: None   Collection Time    01/17/13 11:00 AM      Result Value Range Status   Specimen Description BLOOD RIGHT HAND   Final   Special Requests     Final   Value: BOTTLES DRAWN AEROBIC AND ANAEROBIC AERO 10CC ANA 3CC   Culture  Setup Time 01/17/2013 17:38   Final   Culture     Final   Value:        BLOOD CULTURE RECEIVED NO GROWTH TO DATE CULTURE WILL BE HELD FOR 5 DAYS BEFORE ISSUING A FINAL NEGATIVE  REPORT   Report Status PENDING   Incomplete  CULTURE, BLOOD (ROUTINE X 2)     Status: None   Collection Time    01/17/13 11:10 AM      Result Value Range Status   Specimen Description BLOOD RIGHT HAND   Final   Special Requests BOTTLES DRAWN AEROBIC ONLY 10CC   Final   Culture  Setup Time 01/17/2013 17:38   Final   Culture     Final   Value:        BLOOD CULTURE RECEIVED NO GROWTH TO DATE CULTURE WILL BE HELD FOR 5 DAYS BEFORE ISSUING A FINAL NEGATIVE REPORT   Report Status PENDING   Incomplete     Studies:  Recent x-ray studies have been reviewed in detail by the Attending Physician  Scheduled Meds:  Scheduled Meds: . amLODipine  5 mg Oral Daily  . antiseptic oral rinse  15 mL Mouth Rinse QID  . carvedilol  3.125 mg Oral BID WC  . chlorhexidine  15 mL Mouth Rinse BID  . enalapril  20 mg Oral BID  . famotidine  20 mg Oral BID  . feeding supplement  237 mL Oral TID WC  . ibuprofen  600 mg Oral TID  . insulin aspart  0-20 Units Subcutaneous TID WC  . MUSCLE RUB   Topical TID  . pantoprazole  40 mg Oral Daily  . phenytoin (DILANTIN) IV  150 mg Intravenous Q8H   Continuous Infusions:   Time spent on care of this patient: 55 minutes   Micro, MD [place]  [phone] Pager - Text Page per Shea Evans as per below:  On-Call/Text Page:      [email or web address]      password [email or web address]  If 7PM-7AM, please contact night-coverage [email or web address] Password [email or web address] 01/22/2013, 3:03 PM   LOS: 6 days

## 2013-01-22 NOTE — Progress Notes (Signed)
Subjective: Patient had another episode on yesterday.  Dilantin was loaded.  Later in the afternoon while working with therapy she had one more which was less severe.  Patient is tolerating the Dilantin well.  No side effects noted.  Level of 3.7.    Objective: Current vital signs: BP 141/83  Pulse 62  Temp(Src) 98.6 F (37 C) (Oral)  Resp 18  Ht 5\' 1"  (1.549 m)  Wt 98.4 kg (216 lb 14.9 oz)  BMI 41.01 kg/m2  SpO2 100% Vital signs in last 24 hours: Temp:  [98.5 F (36.9 C)-99.1 F (37.3 C)] 98.6 F (37 C) (08/02 0539) Pulse Rate:  [62-78] 62 (08/02 0539) Resp:  [18-20] 18 (08/02 0539) BP: (119-156)/(65-84) 141/83 mmHg (08/02 0539) SpO2:  [92 %-100 %] 100 % (08/02 0539)  Intake/Output from previous day: 08/01 0701 - 08/02 0700 In: 320 [P.O.:50; IV Piggyback:270] Out: -  Intake/Output this shift:   Nutritional status: Carb Control  Neurologic Exam: Mental Status:  Alert, oriented, thought content appropriate. Speech fluent without evidence of aphasia. Able to follow 3 step commands without difficulty.  Cranial Nerves:  II: Discs flat bilaterally; Visual fields grossly normal, pupils equal, round, reactive to light and accommodation  III,IV, VI: ptosis not present, extra-ocular motions intact bilaterally  V,VII: smile symmetric, facial light touch sensation normal bilaterally  VIII: hearing normal bilaterally  IX,X: gag reflex present  XI: bilateral shoulder shrug  XII: midline tongue extension  Motor:  Right : Upper extremity 5/5         Left: Upper extremity 5/5   Lower extremity 5/5       Lower extremity 5/5  Sensory: Pinprick and light touch intact throughout, bilaterally  Deep Tendon Reflexes:  2+ throughout Plantars:  Right: downgoing     Left: downgoing   Lab Results: Basic Metabolic Panel:  Recent Labs Lab 01/17/13 0130 01/18/13 0345 01/19/13 0516 01/21/13 0800 01/22/13 0525  NA 131* 134* 134* 133* 134*  K 3.7 3.4* 3.9 4.4 3.6  CL 91* 92* 92* 93*  94*  CO2 34* 33* 31 30 30   GLUCOSE 141* 139* 155* 246* 174*  BUN 35* 30* 28* 24* 17  CREATININE 0.80 0.84 0.77 0.73 0.77  CALCIUM 8.9 9.2 9.1 9.4 9.2  MG 2.6* 2.4 2.0  --   --   PHOS 3.3 3.9 4.4  --   --     Liver Function Tests:  Recent Labs Lab 01/17/13 0130 01/22/13 0525  AST 49*  --   ALT 74*  --   ALKPHOS 105  --   BILITOT 0.5  --   PROT 7.1  --   ALBUMIN 2.7* 2.3*   No results found for this basename: LIPASE, AMYLASE,  in the last 168 hours No results found for this basename: AMMONIA,  in the last 168 hours  CBC:  Recent Labs Lab 01/17/13 0130 01/18/13 0345 01/19/13 0516 01/21/13 0800  WBC 17.5* 16.0* 16.4* 12.9*  HGB 10.1* 10.8* 10.5* 10.6*  HCT 30.0* 32.9* 32.5* 32.0*  MCV 79.4 80.4 81.3 80.8  PLT 184 209 234 309    Cardiac Enzymes:  Recent Labs Lab 01/20/13 0308 01/20/13 0830  TROPONINI <0.30 <0.30    Lipid Panel: No results found for this basename: CHOL, TRIG, HDL, CHOLHDL, VLDL, LDLCALC,  in the last 168 hours  CBG:  Recent Labs Lab 01/21/13 1115 01/21/13 1647 01/21/13 2128 01/22/13 0647 01/22/13 1139  GLUCAP 266* 148* 157* 166* 212*    Microbiology: Results for orders placed  during the hospital encounter of 01/16/13  MRSA PCR SCREENING     Status: None   Collection Time    01/16/13 10:54 PM      Result Value Range Status   MRSA by PCR NEGATIVE  NEGATIVE Final   Comment:            [name] (FDA     approved for NASAL specimens     only), is one component of a     comprehensive MRSA colonization     surveillance program. It is not     intended to diagnose MRSA     infection nor to guide or     monitor treatment for     MRSA infections.  CULTURE, BLOOD (ROUTINE X 2)     Status: None   Collection Time    01/17/13 11:00 AM      Result Value Range Status   Specimen Description BLOOD RIGHT HAND   Final   Special Requests     Final   Value: BOTTLES DRAWN AEROBIC AND ANAEROBIC AERO 10CC ANA 3CC   Culture   Setup Time 01/17/2013 17:38   Final   Culture     Final   Value:        BLOOD CULTURE RECEIVED NO GROWTH TO DATE CULTURE WILL BE HELD FOR 5 DAYS BEFORE ISSUING A FINAL NEGATIVE REPORT   Report Status PENDING   Incomplete  CULTURE, BLOOD (ROUTINE X 2)     Status: None   Collection Time    01/17/13 11:10 AM      Result Value Range Status   Specimen Description BLOOD RIGHT HAND   Final   Special Requests BOTTLES DRAWN AEROBIC ONLY 10CC   Final   Culture  Setup Time 01/17/2013 17:38   Final   Culture     Final   Value:        BLOOD CULTURE RECEIVED NO GROWTH TO DATE CULTURE WILL BE HELD FOR 5 DAYS BEFORE ISSUING A FINAL NEGATIVE REPORT   Report Status PENDING   Incomplete    Coagulation Studies: No results found for this basename: LABPROT, INR,  in the last 72 hours  Imaging: No results found.  Medications:  I have reviewed the patient's current medications. Scheduled: . antiseptic oral rinse  15 mL Mouth Rinse QID  . carvedilol  3.125 mg Oral BID WC  . chlorhexidine  15 mL Mouth Rinse BID  . famotidine  20 mg Oral BID  . feeding supplement  237 mL Oral TID WC  . ibuprofen  600 mg Oral TID  . insulin aspart  0-20 Units Subcutaneous TID WC  . MUSCLE RUB   Topical TID  . pantoprazole  40 mg Oral Daily  . phenytoin (DILANTIN) IV  150 mg Intravenous Q8H  . phenytoin (DILANTIN) IV  500 mg Intravenous Once    Assessment/Plan: 67 year old female s/p SAH now with episodes felt to be seizure.  Loaded with Dilantin and on maintenance.  Level subtherapeutic.  Clinically patient improved.  Imaging shows improvement in hemorrhage.    Recommendations: 1.  Dilantin 500mg  IV now 2.  Increased maintenance Dilantin to 150mg  q8 hours 3.  Dilantin level in AM 4.  Continue seizure precautions   LOS: 6 days   Alexis Goodell, MD [facility] [phone] 01/22/2013  12:38 PM

## 2013-01-23 LAB — CULTURE, BLOOD (ROUTINE X 2)
Culture: NO GROWTH
Culture: NO GROWTH

## 2013-01-23 LAB — GLUCOSE, CAPILLARY
Glucose-Capillary: 177 mg/dL — ABNORMAL HIGH (ref 70–99)
Glucose-Capillary: 237 mg/dL — ABNORMAL HIGH (ref 70–99)
Glucose-Capillary: 142 mg/dL — ABNORMAL HIGH (ref 70–99)

## 2013-01-23 LAB — BASIC METABOLIC PANEL
BUN: 14 mg/dL (ref 6–23)
CO2: 28 mEq/L (ref 19–32)
Calcium: 9.3 mg/dL (ref 8.4–10.5)
Chloride: 95 mEq/L — ABNORMAL LOW (ref 96–112)
Creatinine, Ser: 0.74 mg/dL (ref 0.50–1.10)
GFR calc Af Amer: >90 mL/min (ref >90)
GFR calc non Af Amer: 85 mL/min — ABNORMAL LOW (ref >90)
Glucose, Bld: 192 mg/dL — ABNORMAL HIGH (ref 70–99)
Potassium: 3.4 mEq/L — ABNORMAL LOW (ref 3.5–5.1)
Sodium: 133 mEq/L — ABNORMAL LOW (ref 135–145)

## 2013-01-23 LAB — PHENYTOIN LEVEL, TOTAL: Phenytoin Lvl: 7.8 ug/mL — ABNORMAL LOW (ref 10.0–20.0)

## 2013-01-23 MED ORDER — PHENYTOIN SODIUM EXTENDED 100 MG PO CAPS
200.0000 mg | ORAL_CAPSULE | Freq: Two times a day (BID) | ORAL | Status: DC
  Administered 2013-01-23 – 2013-01-26 (×7): 200 mg via ORAL
  Filled 2013-01-23 (×10): qty 2

## 2013-01-23 MED ORDER — IBUPROFEN 600 MG PO TABS
600.0000 mg | ORAL_TABLET | Freq: Three times a day (TID) | ORAL | Status: DC
  Administered 2013-01-23 – 2013-01-24 (×3): 600 mg via ORAL
  Filled 2013-01-23 (×6): qty 1

## 2013-01-23 MED ORDER — IBUPROFEN 800 MG PO TABS: 800.0000 mg | ORAL_TABLET | Freq: Three times a day (TID) | ORAL | Status: DC

## 2013-01-23 MED ORDER — POTASSIUM CHLORIDE CRYS ER 20 MEQ PO TBCR
40.0000 meq | EXTENDED_RELEASE_TABLET | Freq: Once | ORAL | Status: AC
  Administered 2013-01-23: 40 meq via ORAL
  Filled 2013-01-23: qty 2

## 2013-01-23 NOTE — Progress Notes (Signed)
[facility] Progress Note [place] TEAM 1 - Stepdown/ICU TEAM   Debra Saunders [id number] DOB: 08/26/1945 DOA: 01/16/2013 PCP: [name], MD  Brief narrative: This is a 67 year old female with a past medical history of diabetes mellitus, hypertension, chronic congestive heart failure, moderate to severe pulmonary hypertension, obstructive sleep apnea, COPD on 2 L of oxygen at home.  The patient was admitted to [facility] on 01/11/13 with a complaint of chest pain. It was thought to be secondary to chest wall pain/costochondritis. Cardiology consult was requested and they agreed that it was noncardiac in origin. She had a cath in August 2013 which was negative.    A CT of the chest was performedduring the workup for her chest pain and results are as follows "cannot exclude tiny pulmonary embolus in a small segmental branch right lower lobe" and "persistent mild interstitial changes suggestive of pneumonitis or CHF these have improved from prior study in 7/22." She was started on Xarelto for the PE.    She had a PEA arrest on 7/27 at 4:30 PM after 30 seconds of chest compressions she began to wake up but was noted to be confused with slurred speech and was not able to protect her airway and therefore was intubated. Further workup revealed a small subarachnoid hemorrhage and she was therefore transferred to [facility] to be further evaluated by neurosurgery. She has not yet been evaluated by neurosurgery despite a consult request.  Patient was managed in the ICU initially and eventually was able to be extubated. Dr.Yacoub also spoke with radiology and the CT of the chestfrom [facility] was reviewed- it was read to be positive for PE - Her Xarelto has been discontinued due to subarachnoid hemorrhage- she had an IVC filter placed on 7/28- lower extremity duplex was negative for DVT   Assessment/Plan: Principal Problem:   Subarachnoid  hemorrhage -Dr Wynelle Cleveland spoke with neurosurgery- Dr Annette Stable recommends resuming anticoagulation in 2 wks.  -Continue to hold Xarelto-retrievable IVC filter is in place -patient's mental status is back to baseline  Active Problems:   Pulmonary embolus-subsegmental tiny PE -holding off on Xarelto -IVC filter in place-no DVT noted -doubt the PE has any contribution to her chest pain  Chest pain-Cardiac workup has been negative. Although PE was found on the CT it is too small to be causing her any pain.   (a) Musculoskeletal chest pain- was on Naprosyn and Toradol for this, however, began having pain with swallowing which is likely due to esophagitis therefore I have discontinued Naprosyn and Toradol.Will treat chest wall pain with Aspercreme, heating pad and Vicodin- tried ultram but pt had what looked to be a seizure- as Tramadol decreases seizure threshold, it has been discontinued. Pt should not resume it when discharged. Continue scheduled ibuprofen.  (b) probable esophagitis-this pain resolved immediately after a GI cocktail with lidocaine was administered . Therefore will treat this with Protonix daily, Pepcid twice a day and GI cocktail with lidocaine 3 times a day as needed. She should probably continue this treatment for at least a month.  Seizure - apparently had rolling back of eyes and shaking of body on 7/30 and 01/21/2013.- see neuro note Patient noted per neurology to have a seizure. Patient started on dilantin per neuro and dose adjusted today.. EEG negative. Neurology following.    Hypertension -continue current medications -on Coreg, norvasc and enalapril.    Chronic diastolic heart failure/  Pulmonary hypertension -was given Lasix x 3 doses on 7-29 -  echo performed recently at [facility]-need to followup  Positive blood cultures at [facility] -apparently pos for strep - Vanc was initially given but d/c'd since cultures obtained here have been negative   Severe  COPD on 2 L of oxygen at home -Not having an exacerbation at this time -when necessary albuterol nebulizers    Diabetes mellitus type 2, controlled -Takes metformin at home-Currently on sliding scale insulin with meals    OSA (obstructive sleep apnea) -CPAP at bedtime  Code Status: full code Family Communication: none today-  Disposition Plan: SNF when medically stable.  Consultants: none  DVT prophylaxis: SCDs  HPI/Subjective: Patient states chest pain on and off and slowly improving however now in the lower part of the chest. It initially improved after a GI cocktail with lidocaine but then recurred after she started eating and with moving around.This is the same pain that she was admitted for at [facility] last week.  Patient also with seizure in the room per nursing 2 days ago, however patient was alert during the episode of yelling out in pain. Patient with  No observed seizures today.   Objective: Blood pressure 128/73, pulse 63, temperature 98.3 F (36.8 C), temperature source Oral, resp. rate 20, height 5\' 1"  (1.549 m), weight 98.4 kg (216 lb 14.9 oz), SpO2 99.00%.  Intake/Output Summary (Last 24 hours) at 01/23/13 1425 Last data filed at 01/22/13 1815  Gross per 24 hour  Intake    120 ml  Output      0 ml  Net    120 ml     Exam: General: No acute respiratory distress Lungs: Clear to auscultation bilaterally without wheezes or crackles Cardiovascular: Regular rate and rhythm without murmur gallop or rub normal S1 and S2.chest wall tender to palpation. Abdomen: Nontender, nondistended, soft, bowel sounds positive, no rebound, no ascites, no appreciable mass Extremities: No significant cyanosis, clubbing, or edema bilateral lower extremities  Data Reviewed: Basic Metabolic Panel:  Recent Labs Lab 01/17/13 0130 01/18/13 0345 01/19/13 0516 01/21/13 0800 01/22/13 0525 01/23/13 0531  NA 131* 134* 134* 133* 134* 133*  K 3.7 3.4* 3.9 4.4 3.6 3.4*  CL  91* 92* 92* 93* 94* 95*  CO2 34* 33* 31 30 30 28   GLUCOSE 141* 139* 155* 246* 174* 192*  BUN 35* 30* 28* 24* 17 14  CREATININE 0.80 0.84 0.77 0.73 0.77 0.74  CALCIUM 8.9 9.2 9.1 9.4 9.2 9.3  MG 2.6* 2.4 2.0  --   --   --   PHOS 3.3 3.9 4.4  --   --   --    Liver Function Tests:  Recent Labs Lab 01/17/13 0130 01/22/13 0525  AST 49*  --   ALT 74*  --   ALKPHOS 105  --   BILITOT 0.5  --   PROT 7.1  --   ALBUMIN 2.7* 2.3*   No results found for this basename: LIPASE, AMYLASE,  in the last 168 hours No results found for this basename: AMMONIA,  in the last 168 hours CBC:  Recent Labs Lab 01/17/13 0130 01/18/13 0345 01/19/13 0516 01/21/13 0800  WBC 17.5* 16.0* 16.4* 12.9*  HGB 10.1* 10.8* 10.5* 10.6*  HCT 30.0* 32.9* 32.5* 32.0*  MCV 79.4 80.4 81.3 80.8  PLT 184 209 234 309   Cardiac Enzymes:  Recent Labs Lab 01/20/13 0308 01/20/13 0830  TROPONINI <0.30 <0.30   BNP (last 3 results) No results found for this basename: PROBNP,  in the last 8760 hours  CBG:  Recent Labs Lab 01/22/13 1139 01/22/13 1649 01/22/13 2142 01/23/13 0640 01/23/13 1134  GLUCAP 212* 248* 238* 177* 237*    Recent Results (from the past 240 hour(s))  MRSA PCR SCREENING     Status: None   Collection Time    01/16/13 10:54 PM      Result Value Range Status   MRSA by PCR NEGATIVE  NEGATIVE Final   Comment:            [name] (FDA     approved for NASAL specimens     only), is one component of a     comprehensive MRSA colonization     surveillance program. It is not     intended to diagnose MRSA     infection nor to guide or     monitor treatment for     MRSA infections.  CULTURE, BLOOD (ROUTINE X 2)     Status: None   Collection Time    01/17/13 11:00 AM      Result Value Range Status   Specimen Description BLOOD RIGHT HAND   Final   Special Requests     Final   Value: BOTTLES DRAWN AEROBIC AND ANAEROBIC AERO 10CC ANA 3CC   Culture  Setup Time 01/17/2013 17:38    Final   Culture NO GROWTH 5 DAYS   Final   Report Status 01/23/2013 FINAL   Final  CULTURE, BLOOD (ROUTINE X 2)     Status: None   Collection Time    01/17/13 11:10 AM      Result Value Range Status   Specimen Description BLOOD RIGHT HAND   Final   Special Requests BOTTLES DRAWN AEROBIC ONLY 10CC   Final   Culture  Setup Time 01/17/2013 17:38   Final   Culture NO GROWTH 5 DAYS   Final   Report Status 01/23/2013 FINAL   Final     Studies:  Recent x-ray studies have been reviewed in detail by the Attending Physician  Scheduled Meds:  Scheduled Meds: . amLODipine  5 mg Oral Daily  . antiseptic oral rinse  15 mL Mouth Rinse QID  . carvedilol  3.125 mg Oral BID WC  . chlorhexidine  15 mL Mouth Rinse BID  . enalapril  20 mg Oral BID  . famotidine  20 mg Oral BID  . feeding supplement  237 mL Oral TID WC  . ibuprofen  600 mg Oral TID  . insulin aspart  0-20 Units Subcutaneous TID WC  . MUSCLE RUB   Topical TID  . pantoprazole  40 mg Oral Daily  . phenytoin  200 mg Oral BID   Continuous Infusions:   Time spent on care of this patient: 53 minutes   Neah Bay, MD [place]  [phone] Pager - Text Page per Shea Evans as per below:  On-Call/Text Page:      [email or web address]      password [email or web address]  If 7PM-7AM, please contact night-coverage [email or web address] Password [email or web address] 01/23/2013, 2:25 PM   LOS: 7 days

## 2013-01-23 NOTE — Progress Notes (Signed)
Subjective: Patient awake and alert.  Reports that she has had no further seizures.  Tolerating Dilantin without noted side effects.  Reports that she continues to have chest pain.    Objective: Current vital signs: BP 152/78  Pulse 65  Temp(Src) 98.9 F (37.2 C) (Oral)  Resp 18  Ht 5\' 1"  (1.549 m)  Wt 98.4 kg (216 lb 14.9 oz)  BMI 41.01 kg/m2  SpO2 98% Vital signs in last 24 hours: Temp:  [97.7 F (36.5 C)-98.9 F (37.2 C)] 98.9 F (37.2 C) (08/03 0500) Pulse Rate:  [63-90] 65 (08/03 0500) Resp:  [18-20] 18 (08/03 0500) BP: (137-165)/(69-78) 152/78 mmHg (08/03 0500) SpO2:  [94 %-98 %] 98 % (08/03 0500)  Intake/Output from previous day: 08/02 0701 - 08/03 0700 In: 120 [P.O.:120] Out: -  Intake/Output this shift:   Nutritional status: Carb Control  Neurologic Exam: Mental Status:  Alert, oriented, thought content appropriate. Speech fluent without evidence of aphasia. Able to follow 3 step commands without difficulty.  Cranial Nerves:  II: Discs flat bilaterally; Visual fields grossly normal, pupils equal, round, reactive to light and accommodation  III,IV, VI: ptosis not present, extra-ocular motions intact bilaterally  V,VII: smile symmetric, facial light touch sensation normal bilaterally  VIII: hearing normal bilaterally  IX,X: gag reflex present  XI: bilateral shoulder shrug  XII: midline tongue extension  Motor:  Right : Upper extremity 5/5          Left: Upper extremity 5/5   Lower extremity 5/5       Lower extremity 5/5  Sensory: Pinprick and light touch intact throughout, bilaterally  Deep Tendon Reflexes:  2+ throughout  Plantars:  Right: downgoing    Left: downgoing    Lab Results: Basic Metabolic Panel:  Recent Labs Lab 01/17/13 0130 01/18/13 0345 01/19/13 0516 01/21/13 0800 01/22/13 0525 01/23/13 0531  NA 131* 134* 134* 133* 134* 133*  K 3.7 3.4* 3.9 4.4 3.6 3.4*  CL 91* 92* 92* 93* 94* 95*  CO2 34* 33* 31 30 30 28   GLUCOSE 141* 139*  155* 246* 174* 192*  BUN 35* 30* 28* 24* 17 14  CREATININE 0.80 0.84 0.77 0.73 0.77 0.74  CALCIUM 8.9 9.2 9.1 9.4 9.2 9.3  MG 2.6* 2.4 2.0  --   --   --   PHOS 3.3 3.9 4.4  --   --   --     Liver Function Tests:  Recent Labs Lab 01/17/13 0130 01/22/13 0525  AST 49*  --   ALT 74*  --   ALKPHOS 105  --   BILITOT 0.5  --   PROT 7.1  --   ALBUMIN 2.7* 2.3*   No results found for this basename: LIPASE, AMYLASE,  in the last 168 hours No results found for this basename: AMMONIA,  in the last 168 hours  CBC:  Recent Labs Lab 01/17/13 0130 01/18/13 0345 01/19/13 0516 01/21/13 0800  WBC 17.5* 16.0* 16.4* 12.9*  HGB 10.1* 10.8* 10.5* 10.6*  HCT 30.0* 32.9* 32.5* 32.0*  MCV 79.4 80.4 81.3 80.8  PLT 184 209 234 309    Cardiac Enzymes:  Recent Labs Lab 01/20/13 0308 01/20/13 0830  TROPONINI <0.30 <0.30    Lipid Panel: No results found for this basename: CHOL, TRIG, HDL, CHOLHDL, VLDL, LDLCALC,  in the last 168 hours  CBG:  Recent Labs Lab 01/22/13 0647 01/22/13 1139 01/22/13 1649 01/22/13 2142 01/23/13 0640  GLUCAP 166* 212* 248* 238* 177*    Microbiology: Results for  orders placed during the hospital encounter of 01/16/13  MRSA PCR SCREENING     Status: None   Collection Time    01/16/13 10:54 PM      Result Value Range Status   MRSA by PCR NEGATIVE  NEGATIVE Final   Comment:            [name] (FDA     approved for NASAL specimens     only), is one component of a     comprehensive MRSA colonization     surveillance program. It is not     intended to diagnose MRSA     infection nor to guide or     monitor treatment for     MRSA infections.  CULTURE, BLOOD (ROUTINE X 2)     Status: None   Collection Time    01/17/13 11:00 AM      Result Value Range Status   Specimen Description BLOOD RIGHT HAND   Final   Special Requests     Final   Value: BOTTLES DRAWN AEROBIC AND ANAEROBIC AERO 10CC ANA 3CC   Culture  Setup Time 01/17/2013  17:38   Final   Culture     Final   Value:        BLOOD CULTURE RECEIVED NO GROWTH TO DATE CULTURE WILL BE HELD FOR 5 DAYS BEFORE ISSUING A FINAL NEGATIVE REPORT   Report Status PENDING   Incomplete  CULTURE, BLOOD (ROUTINE X 2)     Status: None   Collection Time    01/17/13 11:10 AM      Result Value Range Status   Specimen Description BLOOD RIGHT HAND   Final   Special Requests BOTTLES DRAWN AEROBIC ONLY 10CC   Final   Culture  Setup Time 01/17/2013 17:38   Final   Culture     Final   Value:        BLOOD CULTURE RECEIVED NO GROWTH TO DATE CULTURE WILL BE HELD FOR 5 DAYS BEFORE ISSUING A FINAL NEGATIVE REPORT   Report Status PENDING   Incomplete    Coagulation Studies: No results found for this basename: LABPROT, INR,  in the last 72 hours  Imaging: No results found.  Medications:  I have reviewed the patient's current medications. Scheduled: . amLODipine  5 mg Oral Daily  . antiseptic oral rinse  15 mL Mouth Rinse QID  . carvedilol  3.125 mg Oral BID WC  . chlorhexidine  15 mL Mouth Rinse BID  . enalapril  20 mg Oral BID  . famotidine  20 mg Oral BID  . feeding supplement  237 mL Oral TID WC  . ibuprofen  600 mg Oral TID  . insulin aspart  0-20 Units Subcutaneous TID WC  . MUSCLE RUB   Topical TID  . pantoprazole  40 mg Oral Daily  . phenytoin (DILANTIN) IV  150 mg Intravenous Q8H  . potassium chloride  40 mEq Oral Once    Assessment/Plan: Patient without further seizures.  Tolerating Dilantin.  Level yesterday subtherapeutic.  Today's level is pending.    Recommendations: 1.  Continue Dilantin at current dose.  If level therapeutic would change Dilantin to po at 200mg  BID  2.  Continue seizure precautions   LOS: 7 days   Alexis Goodell, MD [facility] [phone] 01/23/2013  8:35 AM

## 2013-01-23 NOTE — Progress Notes (Signed)
Placed patient on CPAP for the night via auto-mode with minimum pressure set at 4cm and maximum pressure set at 20cm.Oxygen set at 2lpm with SP02=97%

## 2013-01-24 ENCOUNTER — Ambulatory Visit: Payer: Medicare Other | Admitting: Internal Medicine

## 2013-01-24 DIAGNOSIS — R0789 Other chest pain: Secondary | ICD-10-CM

## 2013-01-24 LAB — GLUCOSE, CAPILLARY
Glucose-Capillary: 188 mg/dL — ABNORMAL HIGH (ref 70–99)
Glucose-Capillary: 152 mg/dL — ABNORMAL HIGH (ref 70–99)
Glucose-Capillary: 172 mg/dL — ABNORMAL HIGH (ref 70–99)
Glucose-Capillary: 102 mg/dL — ABNORMAL HIGH (ref 70–99)
Glucose-Capillary: 163 mg/dL — ABNORMAL HIGH (ref 70–99)

## 2013-01-24 LAB — BASIC METABOLIC PANEL
BUN: 10 mg/dL (ref 6–23)
CO2: 27 mEq/L (ref 19–32)
Calcium: 9 mg/dL (ref 8.4–10.5)
Chloride: 97 mEq/L (ref 96–112)
Creatinine, Ser: 0.68 mg/dL (ref 0.50–1.10)
GFR calc Af Amer: >90 mL/min (ref >90)
GFR calc non Af Amer: 87 mL/min — ABNORMAL LOW (ref >90)
Glucose, Bld: 147 mg/dL — ABNORMAL HIGH (ref 70–99)
Potassium: 3.8 mEq/L (ref 3.5–5.1)
Sodium: 135 mEq/L (ref 135–145)

## 2013-01-24 MED ORDER — SUCRALFATE 1 GM/10ML PO SUSP
1.0000 g | Freq: Three times a day (TID) | ORAL | Status: DC
  Administered 2013-01-24 – 2013-01-27 (×12): 1 g via ORAL
  Filled 2013-01-24 (×15): qty 10

## 2013-01-24 MED ORDER — IBUPROFEN 800 MG PO TABS
800.0000 mg | ORAL_TABLET | Freq: Three times a day (TID) | ORAL | Status: AC
  Administered 2013-01-24 – 2013-01-26 (×8): 800 mg via ORAL
  Filled 2013-01-24 (×9): qty 1

## 2013-01-24 NOTE — Progress Notes (Signed)
[facility] Progress Note [place] TEAM 1 - Stepdown/ICU TEAM   Laquina Shortridge [id number] DOB: [birth year] DOA: 01/16/2013 PCP: [name], MD  Brief narrative: This is a 67 year old female with a past medical history of diabetes mellitus, hypertension, chronic congestive heart failure, moderate to severe pulmonary hypertension, obstructive sleep apnea, COPD on 2 L of oxygen at home.  The patient was admitted to [facility] on 01/11/13 with a complaint of chest pain. It was thought to be secondary to chest wall pain/costochondritis. Cardiology consult was requested and they agreed that it was noncardiac in origin. She had a cath in August 2013 which was negative.    A CT of the chest was performedduring the workup for her chest pain and results are as follows "cannot exclude tiny pulmonary embolus in a small segmental branch right lower lobe" and "persistent mild interstitial changes suggestive of pneumonitis or CHF these have improved from prior study in 7/22." She was started on Xarelto for the PE.    She had a PEA arrest on 7/27 at 4:30 PM after 30 seconds of chest compressions she began to wake up but was noted to be confused with slurred speech and was not able to protect her airway and therefore was intubated. Further workup revealed a small subarachnoid hemorrhage and she was therefore transferred to [facility] to be further evaluated by neurosurgery. She has not yet been evaluated by neurosurgery despite a consult request.  Patient was managed in the ICU initially and eventually was able to be extubated. Dr.Yacoub also spoke with radiology and the CT of the chestfrom [place] [facility] was reviewed- it was read to be positive for PE - Her Xarelto has been discontinued due to subarachnoid hemorrhage- she had an IVC filter placed on 7/28- lower extremity duplex was negative for DVT   Assessment/Plan: Principal Problem:   Subarachnoid  hemorrhage -Dr Wynelle Cleveland spoke with neurosurgery- Dr Annette Stable recommends resuming anticoagulation in 2 wks.  -Continue to hold Xarelto-retrievable IVC filter is in place -patient's mental status is back to baseline  Active Problems:   Pulmonary embolus-subsegmental tiny PE -holding off on Xarelto -IVC filter in place-no DVT noted -doubt the PE has any contribution to her chest pain  Chest pain-Cardiac workup has been negative. Although PE was found on the CT it is too small to be causing her any pain.   (a) Musculoskeletal chest pain- was on Naprosyn and Toradol for this, however, began having pain with swallowing which is likely due to esophagitis therefore I have discontinued Naprosyn and Toradol.Will treat chest wall pain with Aspercreme, heating pad and Vicodin- tried ultram but pt had what looked to be a seizure- as Tramadol decreases seizure threshold, it has been discontinued. Pt should not resume it when discharged. Continue scheduled ibuprofen.  (b) probable esophagitis-this pain resolved immediately after a GI cocktail with lidocaine was administered . Therefore will treat this with Protonix daily, Pepcid twice a day and GI cocktail with lidocaine 3 times a day as needed. She should probably continue this treatment for at least a month. Will add carafate to regimen.  Seizure - apparently had rolling back of eyes and shaking of body on 7/30 and 01/21/2013.- see neuro note Patient noted per neurology to have a seizure. Patient started on dilantin per neuro and dose adjusted. EEG negative. Neurology following.    Hypertension -continue current medications -on Coreg, norvasc and enalapril.    Chronic diastolic heart failure/  Pulmonary hypertension -was given Lasix x  3 doses on 7-29 -echo performed recently at [place] [facility]-need to followup  Positive blood cultures at [place] [facility] -apparently pos for strep - Vanc was initially given but d/c'd since cultures obtained here have  been negative   Severe COPD on 2 L of oxygen at home -Not having an exacerbation at this time -when necessary albuterol nebulizers    Diabetes mellitus type 2, controlled -Takes metformin at home-Currently on sliding scale insulin with meals    OSA (obstructive sleep apnea) -CPAP at bedtime  Code Status: full code Family Communication: none today-  Disposition Plan: SNF when medically stable.  Consultants: none  DVT prophylaxis: SCDs  HPI/Subjective: Patient states chest pain on and off and slowly improving however now in the lower part of the chest. It initially improved after a GI cocktail with lidocaine but then recurred after she started eating and with moving around.This is the same pain that she was admitted for at [facility] last week.  Patient also with seizure in the room per nursing 3 days ago, however patient was alert during the episode of yelling out in pain. Patient with  No observed seizures today. Patient still c/o CP.   Objective: Blood pressure 169/82, pulse 64, temperature 98.5 F (36.9 C), temperature source Oral, resp. rate 20, height 5\' 1"  (1.549 m), weight 98.4 kg (216 lb 14.9 oz), SpO2 99.00%.  Intake/Output Summary (Last 24 hours) at 01/24/13 1557 Last data filed at 01/24/13 1336  Gross per 24 hour  Intake    480 ml  Output      0 ml  Net    480 ml     Exam: General: No acute respiratory distress Lungs: Clear to auscultation bilaterally without wheezes or crackles Cardiovascular: Regular rate and rhythm without murmur gallop or rub normal S1 and S2.chest wall tender to palpation. Abdomen: Nontender, nondistended, soft, bowel sounds positive, no rebound, no ascites, no appreciable mass Extremities: No significant cyanosis, clubbing, or edema bilateral lower extremities  Data Reviewed: Basic Metabolic Panel:  Recent Labs Lab 01/18/13 0345 01/19/13 0516 01/21/13 0800 01/22/13 0525 01/23/13 0531 01/24/13 0545  NA 134* 134* 133*  134* 133* 135  K 3.4* 3.9 4.4 3.6 3.4* 3.8  CL 92* 92* 93* 94* 95* 97  CO2 33* 31 30 30 28 27   GLUCOSE 139* 155* 246* 174* 192* 147*  BUN 30* 28* 24* 17 14 10   CREATININE 0.84 0.77 0.73 0.77 0.74 0.68  CALCIUM 9.2 9.1 9.4 9.2 9.3 9.0  MG 2.4 2.0  --   --   --   --   PHOS 3.9 4.4  --   --   --   --    Liver Function Tests:  Recent Labs Lab 01/22/13 0525  ALBUMIN 2.3*   No results found for this basename: LIPASE, AMYLASE,  in the last 168 hours No results found for this basename: AMMONIA,  in the last 168 hours CBC:  Recent Labs Lab 01/18/13 0345 01/19/13 0516 01/21/13 0800  WBC 16.0* 16.4* 12.9*  HGB 10.8* 10.5* 10.6*  HCT 32.9* 32.5* 32.0*  MCV 80.4 81.3 80.8  PLT 209 234 309   Cardiac Enzymes:  Recent Labs Lab 01/20/13 0308 01/20/13 0830  TROPONINI <0.30 <0.30   BNP (last 3 results) No results found for this basename: PROBNP,  in the last 8760 hours CBG:  Recent Labs Lab 01/23/13 1134 01/23/13 1631 01/23/13 2139 01/24/13 0652 01/24/13 1102  GLUCAP 237* 142* 188* 152* 172*    Recent Results (from  the past 240 hour(s))  MRSA PCR SCREENING     Status: None   Collection Time    01/16/13 10:54 PM      Result Value Range Status   MRSA by PCR NEGATIVE  NEGATIVE Final   Comment:            [name] (FDA     approved for NASAL specimens     only), is one component of a     comprehensive MRSA colonization     surveillance program. It is not     intended to diagnose MRSA     infection nor to guide or     monitor treatment for     MRSA infections.  CULTURE, BLOOD (ROUTINE X 2)     Status: None   Collection Time    01/17/13 11:00 AM      Result Value Range Status   Specimen Description BLOOD RIGHT HAND   Final   Special Requests     Final   Value: BOTTLES DRAWN AEROBIC AND ANAEROBIC AERO 10CC ANA 3CC   Culture  Setup Time 01/17/2013 17:38   Final   Culture NO GROWTH 5 DAYS   Final   Report Status 01/23/2013 FINAL   Final  CULTURE,  BLOOD (ROUTINE X 2)     Status: None   Collection Time    01/17/13 11:10 AM      Result Value Range Status   Specimen Description BLOOD RIGHT HAND   Final   Special Requests BOTTLES DRAWN AEROBIC ONLY 10CC   Final   Culture  Setup Time 01/17/2013 17:38   Final   Culture NO GROWTH 5 DAYS   Final   Report Status 01/23/2013 FINAL   Final     Studies:  Recent x-ray studies have been reviewed in detail by the Attending Physician  Scheduled Meds:  Scheduled Meds: . amLODipine  5 mg Oral Daily  . antiseptic oral rinse  15 mL Mouth Rinse QID  . carvedilol  3.125 mg Oral BID WC  . chlorhexidine  15 mL Mouth Rinse BID  . enalapril  20 mg Oral BID  . famotidine  20 mg Oral BID  . feeding supplement  237 mL Oral TID WC  . ibuprofen  800 mg Oral TID  . insulin aspart  0-20 Units Subcutaneous TID WC  . MUSCLE RUB   Topical TID  . pantoprazole  40 mg Oral Daily  . phenytoin  200 mg Oral BID  . sucralfate  1 g Oral TID WC & HS   Continuous Infusions:   Time spent on care of this patient: 53 minutes   Ines Rebel, [place]  [phone] Pager - Text Page per Shea Evans as per below:  On-Call/Text Page:      [email or web address]      password [email or web address]  If 7PM-7AM, please contact night-coverage [email or web address] Password [email or web address] 01/24/2013, 3:57 PM   LOS: 8 days

## 2013-01-24 NOTE — Progress Notes (Signed)
Subjective: Patietn has had no further seizures.  Tolerating Dilantin well.  Level yesterday of 7.8, correcting to 14.       Objective: Current vital signs: BP 127/66  Pulse 58  Temp(Src) 98.4 F (36.9 C) (Oral)  Resp 20  Ht 5\' 1"  (1.549 m)  Wt 98.4 kg (216 lb 14.9 oz)  BMI 41.01 kg/m2  SpO2 96% Vital signs in last 24 hours: Temp:  [97.5 F (36.4 C)-98.7 F (37.1 C)] 98.4 F (36.9 C) (08/04 0600) Pulse Rate:  [58-78] 58 (08/04 0600) Resp:  [18-20] 20 (08/04 0600) BP: (127-168)/(54-72) 127/66 mmHg (08/04 0600) SpO2:  [96 %-99 %] 96 % (08/04 0600)  Intake/Output from previous day:   Intake/Output this shift:   Nutritional status: Carb Control  Neurologic Exam: Mental Status:  Alert, oriented, thought content appropriate. Speech fluent without evidence of aphasia. Able to follow 3 step commands without difficulty.  Cranial Nerves:  II: Discs flat bilaterally; Visual fields grossly normal, pupils equal, round, reactive to light and accommodation  III,IV, VI: ptosis not present, extra-ocular motions intact bilaterally  V,VII: smile symmetric, facial light touch sensation normal bilaterally  VIII: hearing normal bilaterally  IX,X: gag reflex present  XI: bilateral shoulder shrug  XII: midline tongue extension  Motor:  Right : Upper extremity 5/5         Left: Upper extremity 5/5   Lower extremity 5/5      Lower extremity 5/5  Sensory: Pinprick and light touch intact throughout, bilaterally  Deep Tendon Reflexes:  2+ throughout  Plantars:  Right: downgoing    Left: downgoing    Lab Results: Results for orders placed during the hospital encounter of 01/16/13 (from the past 48 hour(s))  GLUCOSE, CAPILLARY     Status: Abnormal   Collection Time    01/22/13 11:39 AM      Result Value Range   Glucose-Capillary 212 (*) 70 - 99 mg/dL   Comment 1 Documented in Chart     Comment 2 Notify RN    GLUCOSE, CAPILLARY     Status: Abnormal   Collection Time    01/22/13  4:49 PM       Result Value Range   Glucose-Capillary 248 (*) 70 - 99 mg/dL  GLUCOSE, CAPILLARY     Status: Abnormal   Collection Time    01/22/13  9:42 PM      Result Value Range   Glucose-Capillary 238 (*) 70 - 99 mg/dL   Comment 1 Notify RN    BASIC METABOLIC PANEL     Status: Abnormal   Collection Time    01/23/13  5:31 AM      Result Value Range   Sodium 133 (*) 135 - 145 mEq/L   Potassium 3.4 (*) 3.5 - 5.1 mEq/L   Chloride 95 (*) 96 - 112 mEq/L   CO2 28  19 - 32 mEq/L   Glucose, Bld 192 (*) 70 - 99 mg/dL   BUN 14  6 - 23 mg/dL   Creatinine, Ser 0.74  0.50 - 1.10 mg/dL   Calcium 9.3  8.4 - 10.5 mg/dL   GFR calc non Af Amer 85 (*) >90 mL/min   GFR calc Af Amer >90  >90 mL/min   Comment:            The eGFR has been calculated     using the CKD EPI equation.     This calculation has not been     validated in all clinical  situations.     eGFR's persistently     <90 mL/min signify     possible Chronic Kidney Disease.  GLUCOSE, CAPILLARY     Status: Abnormal   Collection Time    01/23/13  6:40 AM      Result Value Range   Glucose-Capillary 177 (*) 70 - 99 mg/dL   Comment 1 Notify RN    PHENYTOIN LEVEL, TOTAL     Status: Abnormal   Collection Time    01/23/13  7:30 AM      Result Value Range   Phenytoin Lvl 7.8 (*) 10.0 - 20.0 ug/mL  GLUCOSE, CAPILLARY     Status: Abnormal   Collection Time    01/23/13 11:34 AM      Result Value Range   Glucose-Capillary 237 (*) 70 - 99 mg/dL   Comment 1 Documented in Chart     Comment 2 Notify RN    GLUCOSE, CAPILLARY     Status: Abnormal   Collection Time    01/23/13  4:31 PM      Result Value Range   Glucose-Capillary 142 (*) 70 - 99 mg/dL   Comment 1 Notify RN     Comment 2 Documented in Chart    GLUCOSE, CAPILLARY     Status: Abnormal   Collection Time    01/23/13  9:39 PM      Result Value Range   Glucose-Capillary 188 (*) 70 - 99 mg/dL  BASIC METABOLIC PANEL     Status: Abnormal   Collection Time    01/24/13  5:45 AM       Result Value Range   Sodium 135  135 - 145 mEq/L   Potassium 3.8  3.5 - 5.1 mEq/L   Chloride 97  96 - 112 mEq/L   CO2 27  19 - 32 mEq/L   Glucose, Bld 147 (*) 70 - 99 mg/dL   BUN 10  6 - 23 mg/dL   Creatinine, Ser 0.68  0.50 - 1.10 mg/dL   Calcium 9.0  8.4 - 10.5 mg/dL   GFR calc non Af Amer 87 (*) >90 mL/min   GFR calc Af Amer >90  >90 mL/min   Comment:            The eGFR has been calculated     using the CKD EPI equation.     This calculation has not been     validated in all clinical     situations.     eGFR's persistently     <90 mL/min signify     possible Chronic Kidney Disease.  GLUCOSE, CAPILLARY     Status: Abnormal   Collection Time    01/24/13  6:52 AM      Result Value Range   Glucose-Capillary 152 (*) 70 - 99 mg/dL    Recent Results (from the past 240 hour(s))  MRSA PCR SCREENING     Status: None   Collection Time    01/16/13 10:54 PM      Result Value Range Status   MRSA by PCR NEGATIVE  NEGATIVE Final   Comment:            [name] (FDA     approved for NASAL specimens     only), is one component of a     comprehensive MRSA colonization     surveillance program. It is not     intended to diagnose MRSA     infection nor to guide  or     monitor treatment for     MRSA infections.  CULTURE, BLOOD (ROUTINE X 2)     Status: None   Collection Time    01/17/13 11:00 AM      Result Value Range Status   Specimen Description BLOOD RIGHT HAND   Final   Special Requests     Final   Value: BOTTLES DRAWN AEROBIC AND ANAEROBIC AERO 10CC ANA 3CC   Culture  Setup Time 01/17/2013 17:38   Final   Culture NO GROWTH 5 DAYS   Final   Report Status 01/23/2013 FINAL   Final  CULTURE, BLOOD (ROUTINE X 2)     Status: None   Collection Time    01/17/13 11:10 AM      Result Value Range Status   Specimen Description BLOOD RIGHT HAND   Final   Special Requests BOTTLES DRAWN AEROBIC ONLY 10CC   Final   Culture  Setup Time 01/17/2013 17:38   Final    Culture NO GROWTH 5 DAYS   Final   Report Status 01/23/2013 FINAL   Final    Lipid Panel No results found for this basename: CHOL, TRIG, HDL, CHOLHDL, VLDL, LDLCALC,  in the last 72 hours  Studies/Results: No results found.  Medications:  I have reviewed the patient's current medications. Scheduled: . amLODipine  5 mg Oral Daily  . antiseptic oral rinse  15 mL Mouth Rinse QID  . carvedilol  3.125 mg Oral BID WC  . chlorhexidine  15 mL Mouth Rinse BID  . enalapril  20 mg Oral BID  . famotidine  20 mg Oral BID  . feeding supplement  237 mL Oral TID WC  . ibuprofen  600 mg Oral TID  . insulin aspart  0-20 Units Subcutaneous TID WC  . MUSCLE RUB   Topical TID  . pantoprazole  40 mg Oral Daily  . phenytoin  200 mg Oral BID    Assessment/Plan: Patient without further events.  Dilantin level therapeutic.  Started on Dilantin at 200mg  BID  Recommendations: 1.  Continue Dilantin at current dose 2.  Continue seizure precautions 3.  Will continue to follow with you.    LOS: 8 days   Alexis Goodell, MD [facility] [phone] 01/24/2013  10:01 AM

## 2013-01-24 NOTE — Progress Notes (Signed)
PT Cancellation Note  Patient Details Name: Debra Saunders MRN: [id number] DOB: 03/13/1946   Cancelled Treatment:    Reason Eval/Treat Not Completed: Patient declined due to chest pain hurting too much to get back up. Will try back tomorrow. 01/24/2013  Donnella Sham, [place] [phone]  (pager)   [name], Tessie Fass 01/24/2013, 4:53 PM

## 2013-01-25 ENCOUNTER — Inpatient Hospital Stay (HOSPITAL_COMMUNITY): Payer: Medicare Other

## 2013-01-25 LAB — GLUCOSE, CAPILLARY
Glucose-Capillary: 125 mg/dL — ABNORMAL HIGH (ref 70–99)
Glucose-Capillary: 155 mg/dL — ABNORMAL HIGH (ref 70–99)
Glucose-Capillary: 171 mg/dL — ABNORMAL HIGH (ref 70–99)
Glucose-Capillary: 151 mg/dL — ABNORMAL HIGH (ref 70–99)

## 2013-01-25 LAB — BASIC METABOLIC PANEL
BUN: 12 mg/dL (ref 6–23)
CO2: 26 mEq/L (ref 19–32)
Calcium: 9 mg/dL (ref 8.4–10.5)
Chloride: 96 mEq/L (ref 96–112)
Creatinine, Ser: 0.65 mg/dL (ref 0.50–1.10)
GFR calc Af Amer: >90 mL/min (ref >90)
GFR calc non Af Amer: 89 mL/min — ABNORMAL LOW (ref >90)
Glucose, Bld: 144 mg/dL — ABNORMAL HIGH (ref 70–99)
Potassium: 3.6 mEq/L (ref 3.5–5.1)
Sodium: 133 mEq/L — ABNORMAL LOW (ref 135–145)

## 2013-01-25 LAB — PHENYTOIN LEVEL, TOTAL: Phenytoin Lvl: 5.1 ug/mL — ABNORMAL LOW (ref 10.0–20.0)

## 2013-01-25 NOTE — Progress Notes (Signed)
Subjective: Patient awake and alert.  Her biggest issue is that of chest pain.  Has had no further seizures.  Dilantin level 5.1, corrected to 9.1.    Objective: Current vital signs: BP 145/104  Pulse 63  Temp(Src) 98.9 F (37.2 C)  Resp 18  Ht 5\' 1"  (1.549 m)  Wt 98.4 kg (216 lb 14.9 oz)  BMI 41.01 kg/m2  SpO2 99% Vital signs in last 24 hours: Temp:  [98 F (36.7 C)-98.9 F (37.2 C)] 98.9 F (37.2 C) (08/05 1020) Pulse Rate:  [58-67] 63 (08/05 1020) Resp:  [18-20] 18 (08/05 1020) BP: (131-169)/(52-104) 145/104 mmHg (08/05 1113) SpO2:  [95 %-99 %] 99 % (08/05 1020)  Intake/Output from previous day: 08/04 0701 - 08/05 0700 In: 480 [P.O.:480] Out: -  Intake/Output this shift: Total I/O In: 60 [P.O.:60] Out: -  Nutritional status: Carb Control  Neurologic Exam: Mental Status:  Alert, oriented, thought content appropriate. Speech fluent without evidence of aphasia. Able to follow 3 step commands without difficulty.  Cranial Nerves:  II: Discs flat bilaterally; Visual fields grossly normal, pupils equal, round, reactive to light and accommodation  III,IV, VI: ptosis not present, extra-ocular motions intact bilaterally  V,VII: smile symmetric, facial light touch sensation normal bilaterally  VIII: hearing normal bilaterally  IX,X: gag reflex present  XI: bilateral shoulder shrug  XII: midline tongue extension  Motor:  Right : Upper extremity 5/5          Left: Upper extremity 5/5   Lower extremity 5/5       Lower extremity 5/5  Sensory: Pinprick and light touch intact throughout, bilaterally  Deep Tendon Reflexes:  2+ throughout  Plantars:  Right: downgoing      Left: downgoing    Lab Results: Basic Metabolic Panel:  Recent Labs Lab 01/19/13 0516 01/21/13 0800 01/22/13 0525 01/23/13 0531 01/24/13 0545 01/25/13 0500  NA 134* 133* 134* 133* 135 133*  K 3.9 4.4 3.6 3.4* 3.8 3.6  CL 92* 93* 94* 95* 97 96  CO2 31 30 30 28 27 26   GLUCOSE 155* 246* 174* 192*  147* 144*  BUN 28* 24* 17 14 10 12   CREATININE 0.77 0.73 0.77 0.74 0.68 0.65  CALCIUM 9.1 9.4 9.2 9.3 9.0 9.0  MG 2.0  --   --   --   --   --   PHOS 4.4  --   --   --   --   --     Liver Function Tests:  Recent Labs Lab 01/22/13 0525  ALBUMIN 2.3*   No results found for this basename: LIPASE, AMYLASE,  in the last 168 hours No results found for this basename: AMMONIA,  in the last 168 hours  CBC:  Recent Labs Lab 01/19/13 0516 01/21/13 0800  WBC 16.4* 12.9*  HGB 10.5* 10.6*  HCT 32.5* 32.0*  MCV 81.3 80.8  PLT 234 309    Cardiac Enzymes:  Recent Labs Lab 01/20/13 0308 01/20/13 0830  TROPONINI <0.30 <0.30    Lipid Panel: No results found for this basename: CHOL, TRIG, HDL, CHOLHDL, VLDL, LDLCALC,  in the last 168 hours  CBG:  Recent Labs Lab 01/24/13 1102 01/24/13 1651 01/24/13 2143 01/25/13 0647 01/25/13 1132  GLUCAP 172* 102* 163* 125* 155*    Microbiology: Results for orders placed during the hospital encounter of 01/16/13  MRSA PCR SCREENING     Status: None   Collection Time    01/16/13 10:54 PM      Result Value  Range Status   MRSA by PCR NEGATIVE  NEGATIVE Final   Comment:            [name] (FDA     approved for NASAL specimens     only), is one component of a     comprehensive MRSA colonization     surveillance program. It is not     intended to diagnose MRSA     infection nor to guide or     monitor treatment for     MRSA infections.  CULTURE, BLOOD (ROUTINE X 2)     Status: None   Collection Time    01/17/13 11:00 AM      Result Value Range Status   Specimen Description BLOOD RIGHT HAND   Final   Special Requests     Final   Value: BOTTLES DRAWN AEROBIC AND ANAEROBIC AERO 10CC ANA 3CC   Culture  Setup Time 01/17/2013 17:38   Final   Culture NO GROWTH 5 DAYS   Final   Report Status 01/23/2013 FINAL   Final  CULTURE, BLOOD (ROUTINE X 2)     Status: None   Collection Time    01/17/13 11:10 AM      Result  Value Range Status   Specimen Description BLOOD RIGHT HAND   Final   Special Requests BOTTLES DRAWN AEROBIC ONLY 10CC   Final   Culture  Setup Time 01/17/2013 17:38   Final   Culture NO GROWTH 5 DAYS   Final   Report Status 01/23/2013 FINAL   Final    Coagulation Studies: No results found for this basename: LABPROT, INR,  in the last 72 hours  Imaging: No results found.  Medications:  I have reviewed the patient's current medications. Scheduled: . amLODipine  5 mg Oral Daily  . antiseptic oral rinse  15 mL Mouth Rinse QID  . carvedilol  3.125 mg Oral BID WC  . chlorhexidine  15 mL Mouth Rinse BID  . enalapril  20 mg Oral BID  . famotidine  20 mg Oral BID  . feeding supplement  237 mL Oral TID WC  . ibuprofen  800 mg Oral TID  . insulin aspart  0-20 Units Subcutaneous TID WC  . MUSCLE RUB   Topical TID  . pantoprazole  40 mg Oral Daily  . phenytoin  200 mg Oral BID  . sucralfate  1 g Oral TID WC & HS    Assessment/Plan: Patient with no further seizures on Dilantin.    Recommendations:  1. Continue Dilantin at current dose  2. Continue seizure precautions  3. Will continue to follow with you.     LOS: 9 days   Alexis Goodell, MD [facility] [phone] 01/25/2013  12:42 PM

## 2013-01-25 NOTE — Progress Notes (Signed)
[facility] Progress Note [place] TEAM 4   Debra Saunders [id number] DOB: [birth year] DOA: 01/16/2013 PCP: [name], MD  Brief narrative: This is a 67 year old female with a past medical history of diabetes mellitus, hypertension, chronic congestive heart failure, moderate to severe pulmonary hypertension, obstructive sleep apnea, COPD on 2 L of oxygen at home.  The patient was admitted to [facility] on 01/11/13 with a complaint of chest pain. It was thought to be secondary to chest wall pain/costochondritis. Cardiology consult was requested and they agreed that it was noncardiac in origin. She had a cath in August 2013 which was negative.    A CT of the chest was performedduring the workup for her chest pain and results are as follows "cannot exclude tiny pulmonary embolus in a small segmental branch right lower lobe" and "persistent mild interstitial changes suggestive of pneumonitis or CHF these have improved from prior study in 7/22." She was started on Xarelto for the PE.    She had a PEA arrest on 7/27 at 4:30 PM after 30 seconds of chest compressions she began to wake up but was noted to be confused with slurred speech and was not able to protect her airway and therefore was intubated. Further workup revealed a small subarachnoid hemorrhage and she was therefore transferred to [facility] to be further evaluated by neurosurgery. She has not yet been evaluated by neurosurgery despite a consult request.  Patient was managed in the ICU initially and eventually was able to be extubated. Dr.Yacoub also spoke with radiology and the CT of the chestfrom [facility] was reviewed- it was read to be positive for PE - Her Xarelto has been discontinued due to subarachnoid hemorrhage- she had an IVC filter placed on 7/28- lower extremity duplex was negative for DVT   Assessment/Plan: Principal Problem:   Subarachnoid hemorrhage -Dr Wynelle Cleveland  spoke with neurosurgery- Dr Annette Stable recommends resuming anticoagulation in 2 wks.  -Continue to hold Xarelto-retrievable IVC filter is in place -patient's mental status is back to baseline  Active Problems:   Pulmonary embolus-subsegmental tiny PE -holding off on Xarelto -IVC filter in place-no DVT noted -doubt the PE has any contribution to her chest pain  Chest pain-Cardiac workup has been negative. Although PE was found on the CT it is too small to be causing her any pain.   (a) Musculoskeletal chest pain- was on Naprosyn and Toradol for this, however, began having pain with swallowing which is likely due to esophagitis therefore I have discontinued Naprosyn and Toradol.Will treat chest wall pain with Aspercreme, heating pad and Vicodin- tried ultram but pt had what looked to be a seizure- as Tramadol decreases seizure threshold, it has been discontinued. Pt should not resume it when discharged. Continue scheduled ibuprofen.  (b) probable esophagitis-this pain resolved immediately after a GI cocktail with lidocaine was administered . Therefore will treat this with Protonix daily, Pepcid twice a day and GI cocktail with lidocaine 3 times a day as needed. She should probably continue this treatment for at least a month. Will add carafate to regimen.  Patient still c/o lower chest pain. Will get rib films to r/o fracture. Incentive spirometry.   Seizure - apparently had rolling back of eyes and shaking of body on 7/30 and 01/21/2013.- see neuro note Patient noted per neurology to have a seizure. Patient started on dilantin per neuro and dose adjusted. EEG negative. Neurology following.    Hypertension -continue current medications -on Coreg, norvasc and enalapril.  Chronic diastolic heart failure/  Pulmonary hypertension -was given Lasix x 3 doses on 7-29 -echo performed recently at [facility]-need to followup  Positive blood cultures at [facility] -apparently pos for strep  - Vanc was initially given but d/c'd since cultures obtained here have been negative   Severe COPD on 2 L of oxygen at home -Not having an exacerbation at this time -when necessary albuterol nebulizers    Diabetes mellitus type 2, controlled -Takes metformin at home-Currently on sliding scale insulin with meals    OSA (obstructive sleep apnea) -CPAP at bedtime  Code Status: full code Family Communication: none today-  Disposition Plan: SNF when medically stable.  Consultants: none  DVT prophylaxis: SCDs  HPI/Subjective: Patient states chest pain on and off and slowly improving however now in the lower part of the chest. It initially improved after a GI cocktail with lidocaine but then recurred after she started eating and with moving around.This is the same pain that she was admitted for at [facility] last week.  Patient also with seizure in the room per nursing 3 days ago, however patient was alert during the episode of yelling out in pain. Patient with  No observed seizures today. Patient still c/o CP more in lower chest.   Objective: Blood pressure 125/66, pulse 65, temperature 98.5 F (36.9 C), temperature source Oral, resp. rate 20, height 5\' 1"  (1.549 m), weight 98.4 kg (216 lb 14.9 oz), SpO2 98.00%.  Intake/Output Summary (Last 24 hours) at 01/25/13 1508 Last data filed at 01/25/13 0830  Gross per 24 hour  Intake     60 ml  Output      0 ml  Net     60 ml     Exam: General: No acute respiratory distress Lungs: Clear to auscultation bilaterally without wheezes or crackles Cardiovascular: Regular rate and rhythm without murmur gallop or rub normal S1 and S2.chest wall tender to palpation. Abdomen: Nontender, nondistended, soft, bowel sounds positive, no rebound, no ascites, no appreciable mass Extremities: No significant cyanosis, clubbing, or edema bilateral lower extremities  Data Reviewed: Basic Metabolic Panel:  Recent Labs Lab 01/19/13 0516  01/21/13 0800 01/22/13 0525 01/23/13 0531 01/24/13 0545 01/25/13 0500  NA 134* 133* 134* 133* 135 133*  K 3.9 4.4 3.6 3.4* 3.8 3.6  CL 92* 93* 94* 95* 97 96  CO2 31 30 30 28 27 26   GLUCOSE 155* 246* 174* 192* 147* 144*  BUN 28* 24* 17 14 10 12   CREATININE 0.77 0.73 0.77 0.74 0.68 0.65  CALCIUM 9.1 9.4 9.2 9.3 9.0 9.0  MG 2.0  --   --   --   --   --   PHOS 4.4  --   --   --   --   --    Liver Function Tests:  Recent Labs Lab 01/22/13 0525  ALBUMIN 2.3*   No results found for this basename: LIPASE, AMYLASE,  in the last 168 hours No results found for this basename: AMMONIA,  in the last 168 hours CBC:  Recent Labs Lab 01/19/13 0516 01/21/13 0800  WBC 16.4* 12.9*  HGB 10.5* 10.6*  HCT 32.5* 32.0*  MCV 81.3 80.8  PLT 234 309   Cardiac Enzymes:  Recent Labs Lab 01/20/13 0308 01/20/13 0830  TROPONINI <0.30 <0.30   BNP (last 3 results) No results found for this basename: PROBNP,  in the last 8760 hours CBG:  Recent Labs Lab 01/24/13 1102 01/24/13 1651 01/24/13 2143 01/25/13 0647 01/25/13  [place]*    Recent Results (from the past 240 hour(s))  MRSA PCR SCREENING     Status: None   Collection Time    01/16/13 10:54 PM      Result Value Range Status   MRSA by PCR NEGATIVE  NEGATIVE Final   Comment:            [name] (FDA     approved for NASAL specimens     only), is one component of a     comprehensive MRSA colonization     surveillance program. It is not     intended to diagnose MRSA     infection nor to guide or     monitor treatment for     MRSA infections.  CULTURE, BLOOD (ROUTINE X 2)     Status: None   Collection Time    01/17/13 11:00 AM      Result Value Range Status   Specimen Description BLOOD RIGHT HAND   Final   Special Requests     Final   Value: BOTTLES DRAWN AEROBIC AND ANAEROBIC AERO 10CC ANA 3CC   Culture  Setup Time 01/17/2013 17:38   Final   Culture NO GROWTH 5 DAYS   Final    Report Status 01/23/2013 FINAL   Final  CULTURE, BLOOD (ROUTINE X 2)     Status: None   Collection Time    01/17/13 11:10 AM      Result Value Range Status   Specimen Description BLOOD RIGHT HAND   Final   Special Requests BOTTLES DRAWN AEROBIC ONLY 10CC   Final   Culture  Setup Time 01/17/2013 17:38   Final   Culture NO GROWTH 5 DAYS   Final   Report Status 01/23/2013 FINAL   Final     Studies:  Recent x-ray studies have been reviewed in detail by the Attending Physician  Scheduled Meds:  Scheduled Meds: . amLODipine  5 mg Oral Daily  . antiseptic oral rinse  15 mL Mouth Rinse QID  . carvedilol  3.125 mg Oral BID WC  . chlorhexidine  15 mL Mouth Rinse BID  . enalapril  20 mg Oral BID  . famotidine  20 mg Oral BID  . feeding supplement  237 mL Oral TID WC  . ibuprofen  800 mg Oral TID  . insulin aspart  0-20 Units Subcutaneous TID WC  . MUSCLE RUB   Topical TID  . pantoprazole  40 mg Oral Daily  . phenytoin  200 mg Oral BID  . sucralfate  1 g Oral TID WC & HS   Continuous Infusions:   Time spent on care of this patient: 60 minutes   Ciana Simmon, [place]  [phone] Pager - Text Page per Shea Evans as per below:  On-Call/Text Page:      [email or web address]      password [email or web address]  If 7PM-7AM, please contact night-coverage [email or web address] Password [email or web address] 01/25/2013, 3:08 PM   LOS: 9 days

## 2013-01-25 NOTE — Progress Notes (Signed)
Occupational Therapy Treatment Patient Details Name: Debra Saunders MRN: [id number] DOB: [birth year] Today's Date: 01/25/2013 Time: [id number] OT Time Calculation (min): 24 min  OT Assessment / Plan / Recommendation  History of present illness 67 years old female with PMH relevant for DM, HTN, diastolic CHF, PAH, COPD, OSA, obesity. Transferred intubated from [place] with DVT, questionable PE and Small frontal SAH after starting Xarelto.     OT comments  Pt limited to just sitting EOB due to 10/10 chest pain.  Pt instructed in relaxation techniques with no improvement.  RN notified.  Pt complaint of dizziness - BP 145/105.   Will need SNF level rehab  Follow Up Recommendations  SNF    Barriers to Discharge       Equipment Recommendations  3 in 1 bedside comode    Recommendations for Other Services    Frequency Min 2X/week   Progress towards OT Goals Progress towards OT goals: Not progressing toward goals - comment (due to pain )  Plan Discharge plan remains appropriate    Precautions / Restrictions Precautions Precautions: Fall Restrictions Weight Bearing Restrictions: No   Pertinent Vitals/Pain     ADL  Upper Body Bathing: Maximal assistance Where Assessed - Upper Body Bathing: Unsupported sitting Lower Body Bathing: +1 Total assistance Where Assessed - Lower Body Bathing: Supine, head of bed up Upper Body Dressing: Maximal assistance Where Assessed - Upper Body Dressing: Unsupported sitting Lower Body Dressing: +1 Total assistance Where Assessed - Lower Body Dressing: Supine, head of bed up;Rolling right and/or left;Lean right and/or left Transfers/Ambulation Related to ADLs: Pt unable to attempt transfer due to chest pain ADL Comments: Pt moved to EOB sitting.  Pt with complaint of chest pain 10/10. Unable to console.  Instructed pt on relaxation and deep breathing with no improvement.  pt unable to perform BADL activity due to pain     OT Diagnosis:    OT Problem List:    OT Treatment Interventions:     OT Goals(current goals can now be found in the care plan section) ADL Goals Pt Will Perform Grooming: with min assist;standing Pt Will Perform Lower Body Bathing: with min assist;with adaptive equipment;sit to/from stand Pt Will Perform Lower Body Dressing: with min assist;with adaptive equipment;sit to/from stand Pt Will Transfer to Toilet: with min assist;bedside commode;ambulating Pt Will Perform Toileting - Clothing Manipulation and hygiene: with min assist;sit to/from stand Additional ADL Goal #1: Pt will transfer supine to sit EOB with supervision in preparation for selfcare tasks.    Visit Information  Last OT Received On: 01/25/13 Assistance Needed: +2 PT/OT Co-Evaluation/Treatment: Yes History of Present Illness: 67 years old female with PMH relevant for DM, HTN, diastolic CHF, PAH, COPD, OSA, obesity. Transferred intubated from [place] with DVT, questionable PE and Small frontal SAH after starting Xarelto.      Subjective Data      Prior Functioning       Cognition  Cognition Arousal/Alertness: Awake/alert Behavior During Therapy: Anxious Overall Cognitive Status: Within Functional Limits for tasks assessed    Mobility  Bed Mobility Bed Mobility: Supine to Sit;Sitting - Scoot to Edge of Bed;Sit to Supine Supine to Sit: 3: Mod assist;With rails Sitting - Scoot to Edge of Bed: 4: Min assist Sit to Supine: 2: Max assist Details for Bed Mobility Assistance: More assist needed after pain set in at [facility] Transfers Transfers: Not assessed Details for Transfer Assistance: unable due to chest pain     Exercises      Balance Balance  Balance Assessed: Yes Static Sitting Balance Static Sitting - Balance Support: Feet supported;No upper extremity supported;Right upper extremity supported;Left upper extremity supported Static Sitting - Level of Assistance: 5: Stand by assistance Static Sitting - Comment/# of Minutes: Pt sat EOB 12 mins with  pain 10/10.  Unable to progress further due to pain    End of Session OT - End of Session Activity Tolerance: Patient limited by pain Patient left: in bed;with call bell/phone within reach;with family/visitor present;with bed alarm set Nurse Communication: Patient requests pain meds  [place], Ellard Artis M 01/25/2013, 12:31 PM

## 2013-01-25 NOTE — Progress Notes (Signed)
Patient refused CPAP tonight.  Stated that it makes her uncomfortable.  RT told her that if she changed her mind to call and let us know.  RT will continue to monitor.

## 2013-01-25 NOTE — Progress Notes (Signed)
Physical Therapy Treatment Patient Details Name: Debra Saunders MRN: [id number] DOB: 02/02/1946 Today's Date: 01/25/2013 Time: [id number] PT Time Calculation (min): 23 min  PT Assessment / Plan / Recommendation  History of Present Illness 67 years old female with PMH relevant for DM, HTN, diastolic CHF, PAH, COPD, OSA, obesity. Transferred intubated from [place] with DVT, questionable PE and Small frontal SAH after starting Xarelto.     PT Comments   Treatment limited by her chest and flank pain (costochondritis) which became progressively worse at EOB to the point pt could go no further.  BP at EOB with report of dizziness  145/104.  Pt writhing in pain while sitting at EOB.   Follow Up Recommendations  SNF     Does the patient have the potential to tolerate intense rehabilitation     Barriers to Discharge        Equipment Recommendations  None recommended by PT    Recommendations for Other Services    Frequency Min 3X/week   Progress towards PT Goals Progress towards PT goals: Not progressing toward goals - comment (due to pain)  Plan Current plan remains appropriate    Precautions / Restrictions Precautions Precautions: Fall Restrictions Weight Bearing Restrictions: No   Pertinent Vitals/Pain "way over 10/10 pain in chest"    Mobility  Bed Mobility Bed Mobility: Supine to Sit;Sitting - Scoot to Edge of Bed;Sit to Supine Supine to Sit: 3: Mod assist;With rails Sitting - Scoot to Edge of Bed: 4: Min assist Sit to Supine: 2: Max assist Details for Bed Mobility Assistance: More assist needed after pain set in at [facility] Transfers Transfers: Not assessed Details for Transfer Assistance: pt hurting too much to continue Ambulation/Gait Ambulation/Gait Assistance: Not tested (comment) Stairs: No    Exercises     PT Diagnosis:    PT Problem List:   PT Treatment Interventions:     PT Goals (current goals can now be found in the care plan section) Acute Rehab PT Goals PT  Goal Formulation: With patient Time For Goal Achievement: 02/01/13 Potential to Achieve Goals: Good  Visit Information  Last PT Received On: 01/25/13 Assistance Needed: +2 History of Present Illness: 67 years old female with PMH relevant for DM, HTN, diastolic CHF, PAH, COPD, OSA, obesity. Transferred intubated from [place] with DVT, questionable PE and Small frontal SAH after starting Xarelto.      Subjective Data  Subjective: I really could use the bathroom   Cognition  Cognition Arousal/Alertness: Awake/alert Behavior During Therapy: WFL for tasks assessed/performed Overall Cognitive Status: Within Functional Limits for tasks assessed    Balance  Static Sitting Balance Static Sitting - Balance Support: Feet supported;No upper extremity supported;Right upper extremity supported;Left upper extremity supported Static Sitting - Level of Assistance: 5: Stand by assistance Static Sitting - Comment/# of Minutes: sat EOB for 12+ min prepping to go to bathroom, taking BP and trying to wait out progressing chest pain..  End of Session PT - End of Session Activity Tolerance: Patient limited by pain Patient left: in chair;with call bell/phone within reach Nurse Communication: Mobility status   GP     Lovelle Lema, Tessie Fass 01/25/2013, 12:20 PM

## 2013-01-25 NOTE — Progress Notes (Signed)
Patient refused CPAP. RT made patient aware that if she changed her mind to let RT know and we would help her put CPAP on.

## 2013-01-26 LAB — CBC
HCT: 29.2 % — ABNORMAL LOW (ref 36.0–46.0)
Hemoglobin: 9.5 g/dL — ABNORMAL LOW (ref 12.0–15.0)
MCH: 26.8 pg (ref 26.0–34.0)
MCHC: 32.5 g/dL (ref 30.0–36.0)
MCV: 82.3 fL (ref 78.0–100.0)
Platelets: 370 10*3/uL (ref 150–400)
RBC: 3.55 MIL/uL — ABNORMAL LOW (ref 3.87–5.11)
RDW: 15.2 % (ref 11.5–15.5)
WBC: 11 10*3/uL — ABNORMAL HIGH (ref 4.0–10.5)

## 2013-01-26 LAB — BASIC METABOLIC PANEL
BUN: 14 mg/dL (ref 6–23)
CO2: 28 mEq/L (ref 19–32)
Calcium: 9.2 mg/dL (ref 8.4–10.5)
Chloride: 100 mEq/L (ref 96–112)
Creatinine, Ser: 0.78 mg/dL (ref 0.50–1.10)
GFR calc Af Amer: >90 mL/min (ref >90)
GFR calc non Af Amer: 83 mL/min — ABNORMAL LOW (ref >90)
Glucose, Bld: 143 mg/dL — ABNORMAL HIGH (ref 70–99)
Potassium: 3.8 mEq/L (ref 3.5–5.1)
Sodium: 136 mEq/L (ref 135–145)

## 2013-01-26 LAB — GLUCOSE, CAPILLARY
Glucose-Capillary: 134 mg/dL — ABNORMAL HIGH (ref 70–99)
Glucose-Capillary: 139 mg/dL — ABNORMAL HIGH (ref 70–99)
Glucose-Capillary: 113 mg/dL — ABNORMAL HIGH (ref 70–99)

## 2013-01-26 MED ORDER — POLYETHYLENE GLYCOL 3350 17 G PO PACK
17.0000 g | PACK | Freq: Every day | ORAL | Status: DC | PRN
  Filled 2013-01-26: qty 1

## 2013-01-26 MED ORDER — DOCUSATE SODIUM 100 MG PO CAPS
100.0000 mg | ORAL_CAPSULE | Freq: Two times a day (BID) | ORAL | Status: DC
  Administered 2013-01-26 – 2013-01-27 (×3): 100 mg via ORAL
  Filled 2013-01-26 (×3): qty 1

## 2013-01-26 NOTE — Progress Notes (Signed)
[facility] PROGRESS NOTE  Debra Saunders [id number] DOB: [birth year] DOA: 01/16/2013 PCP: [name], MD  Assessment/Plan: Subarachnoid hemorrhage  -Dr Wynelle Cleveland spoke with neurosurgery- Dr Annette Stable recommends resuming anticoagulation in 2 wks.  -Continue to hold Xarelto-retrievable IVC filter is in place  -patient's mental status is back to baseline   Pulmonary embolus-subsegmental tiny PE  -holding off on Xarelto--plan to restart 01/31/13 -IVC filter in place-no DVT noted  -doubt the PE has any contribution to her chest pain  Chest pain-Cardiac workup has been negative. Although PE was found on the CT it is too small to be causing her any pain.  (a) Musculoskeletal chest pain- was on Naprosyn and Toradol for this, however, began having pain with swallowing which is likely due to esophagitis therefore I have discontinued Naprosyn and Toradol.Will treat chest wall pain with Aspercreme, heating pad and Vicodin- tried ultram but pt had what looked to be a seizure- as Tramadol decreases seizure threshold, it has been discontinued. Pt should not resume it when discharged. Continue scheduled ibuprofen.  (b) probable esophagitis-this pain resolved immediately after a GI cocktail with lidocaine was administered . Therefore will treat this with Protonix daily, Pepcid twice a day and GI cocktail with lidocaine 3 times a day as needed. She should probably continue this treatment for at least a month. Will add carafate to regimen.  Patient still c/o lower chest pain. Incentive spirometry.  -Rib series negative for fracture Pulmonary infiltrates -Doubt pneumonia -Patient has no fever, no tachycardia, no shortness of breath -Infiltrates were present on the day of admission -Recheck CBC Seizure  - apparently had rolling back of eyes and shaking of body on 7/30 and 01/21/2013.- see neuro note  Patient noted per neurology to have a seizure. Patient started on dilantin per neuro and dose  adjusted.  -EEG negative. Neurology following.  Hypertension  -continue current medications -on Coreg, norvasc and enalapril.  Chronic diastolic heart failure/ Pulmonary hypertension  -was given Lasix x 3 doses on 7-29  -echo performed recently at [facility] Positive blood cultures at [facility]  -apparently pos for strep - Vanc was initially given but d/c'd since cultures obtained here have been negative  Severe COPD on 2 L of oxygen at home  -Not having an exacerbation at this time  -when necessary albuterol nebulizers  Diabetes mellitus type 2, controlled  -Takes metformin at home-Currently on sliding scale insulin with meals  OSA (obstructive sleep apnea)  -CPAP at bedtime  Code Status: full code  Family Communication: sister at bedside  Disposition Plan: SNF 01/27/13           Procedures/Studies: Dg Ribs Bilateral W/chest  01/25/2013   *RADIOLOGY REPORT*  Clinical Data: Bilateral anterior rib pain.  Recent cardiopulmonary resuscitation 1 week ago.  BILATERAL RIBS AND CHEST - 4+ VIEW  Comparison: Chest x-ray dated 01/18/2013  Findings: There is a faint infiltrate in the right upper lobe well as a smaller area of infiltrate in the left upper lobe.  Heart size is normal.  Main pulmonary arteries are prominent.  Chronic elevation of the right hemidiaphragm.  There are no visible rib fractures.  Degenerative changes present throughout the thoracic spine.  Numerous calcified granulomas in the mediastinum.  IMPRESSION:  1.  No acute rib fractures. 2.  Faint bilateral upper lobe pulmonary infiltrates. Possibility of aspiration pneumonitis should be considered. There were faint left upper lobe infiltrates on the prior chest CT dated 01/16/2013. 3.  Prominent main pulmonary arteries.   Original Report  Authenticated By: Lorriane Shire, M.D.   Ct Head Wo Contrast  01/19/2013   *RADIOLOGY REPORT*  Clinical Data: Subarachnoid hemorrhage and seizure.  CT HEAD WITHOUT CONTRAST   Technique:  Contiguous axial images were obtained from the base of the skull through the vertex without contrast.  Comparison: 01/18/2013  Findings: There is barely any residual blood visualized in the high right vertex.  There is no evidence of intraventricular blood, hydrocephalus, mass effect, abnormal edema, infarction or subdural blood.  The skull is unremarkable.  IMPRESSION: Minimal residual visualized subarachnoid blood at the high right posterior vertex.  No interval new findings.   Original Report Authenticated By: Aletta Edouard, M.D.   Ct Head Wo Contrast  01/18/2013   *RADIOLOGY REPORT*  Clinical Data: Follow up subarachnoid hemorrhage.  Altered mental status.  CT HEAD WITHOUT CONTRAST  Technique:  Contiguous axial images were obtained from the base of the skull through the vertex without contrast.  Comparison: 01/17/2013.  Findings: The right frontal subarachnoid hemorrhage has become even less obvious than on yesterday's examination.  Small amount of subarachnoid hemorrhage has settled posteriorly over the posterior frontal lobe near the parietal lobe (image number 21 series 2. There is no hydrocephalus.  No mass effect.  No midline shift. Calvarium intact.  The tiny focus of high attenuation in the left frontal lobe is no longer seen, likely due to partial volume averaging.  No infarct.  Paranasal sinuses are within normal limits.  IMPRESSION: Expected evolution of right frontal subarachnoid hemorrhage, now scarcely visible.  No complicating features such as hydrocephalus.   Original Report Authenticated By: Dereck Ligas, M.D.   Ct Head Wo Contrast  01/17/2013   *RADIOLOGY REPORT*  Clinical Data: Subarachnoid hemorrhage.  Follow-up head CT.  CT HEAD WITHOUT CONTRAST  Technique:  Contiguous axial images were obtained from the base of the skull through the vertex without contrast.  Comparison: 01/16/2013, 1651 hours, [facility]  Findings: Small area of subarachnoid hemorrhage is  present in the right frontal lobe.  May be another tiny focus over the left frontal lobe.  There is no hydrocephalus.  There is no skull fracture.  Mastoid air cells and paranasal sinuses appear within normal limits.  No mass lesion, midline shift or infarct.  IMPRESSION: Small focus of right frontal subarachnoid hemorrhage with possible tiny focus of left frontal hemorrhage.  No hydrocephalus. Compared to 01/16/2013 at 1651 hours, the amount of subarachnoid hemorrhage is smaller.   Original Report Authenticated By: Dereck Ligas, M.D.   Ir Ivc Filter Plmt / S&i /img Guid/mod Sed  01/17/2013   *RADIOLOGY REPORT*  IR IVC FILTER PLACEMENT/ S+I/ IMAGE GUIDE MODERATE SEDATION  Date: 02/17/2022 to  Clinical History: 67 year old female with a DVT and pulmonary embolus.  The patient developed a small acute frontal subarachnoid hemorrhage while anticoagulated on Xarelto.  The patient now requires caval interruption for PE prophylaxis given the contraindication to continued anticoagulation.  Procedures Performed: 1. Ultrasound-guided puncture the right internal jugular vein 2.  Inferior venacavagram 3.  Placement of a potentially retrievable IVC filter in an infrarenal location 4.  Follow-up cava gram  Interventional Radiologist:  Criselda Peaches, MD  Sedation: Moderate (conscious) sedation was used.  Two mg Versed, 100 mcg Fentanyl were administered intravenously.  The patient's vital signs were monitored continuously by radiology nursing throughout the procedure.  Sedation Time: 10 minutes  Fluoroscopy time: 54 seconds  Contrast volume: 55 ml Omnipaque-300  PROCEDURE/FINDINGS:   Informed consent was obtained from the patient  following explanation of the procedure, risks, benefits and alternatives. The patient understands, agrees and consents for the procedure. All questions were addressed. A time out was performed.  Maximal barrier sterile technique utilized including caps, mask, sterile gowns, sterile gloves,  large sterile drape, hand hygiene, and betadine skin prep.  The right neck was interrogated with ultrasound.  The right internal jugular vein was found be widely patent.  Local anesthesia was attained by infiltration with 1% lidocaine.  A small dermatotomy was made with a #11 blade.  Under direct fluoroscopic guidance, the vessel was punctured with a 21-gauge micropuncture needle.  Following transition to have 5-French vascular sheath, a 0.035 inches Britta Mccreedy guide wire was advanced into the right common iliac vein.  The tract from the dermatotomy into the internal jugular vein was dilated to 10-French.  The Bard injectable 10-French safe sheath was then advanced over the wire and positioned in the right common iliac vein.  An inferior vena cava gram was performed.  There is no evidence of caval thrombus.  The inflow of the right and left renal veins was successfully identified.  There is no evidence of duplicated inferior vena cava or other vascular abnormality of significance.  The Bard Oxford IVC filter was then positioned in an infrarenal location and deployed without complication.  A follow-up inferior venacavagram confirmed infrarenal location of the filter.  The sheath was removed and hemostasis attained by manual pressure. The patient tolerated the procedure well and was returned to her room in stable condition.  IMPRESSION:  Successful placement of an infrarenal potentially retrievable Bard Denali IVC filter.  Given the patient's age and contraindication to anticoagulation (intracranial hemorrhage), the filter will likely be considered a permanent implant.  Signed,  Criselda Peaches, MD Vascular & Interventional Radiologist [facility] Radiology   Original Report Authenticated By: Jacqulynn Cadet, M.D.   Dg Chest Port 1 View  01/18/2013   *RADIOLOGY REPORT*  Clinical Data: Respiratory failure.  PORTABLE CHEST - 1 VIEW  Comparison: 01/17/2013.  Findings: Interval tracheal and esophageal extubation.  Mild  cardiomegaly.  Lobulated mediastinal contours, likely vascular when correlated with chest CT 01/16/2013.  There is atherosclerotic calcification and calcified mediastinal lymph nodes.  Low volume lungs with diffuse interstitial prominence.  There are still patchy opacities in the perihilar regions.  No significant effusion.  No pneumothorax.  IMPRESSION:  1. Pulmonary venous congestion  2. Persistent perihilar opacities which could represent atelectasis or pneumonia.   Original Report Authenticated By: Jorje Guild   Dg Chest Port 1 View  01/17/2013   *RADIOLOGY REPORT*  Clinical Data: Endotracheal tube evaluation.  PORTABLE CHEST - 1 VIEW  Comparison: Chest x-ray from yesterday.  Findings: Endotracheal tube ends two centimeters above the carina. Gastric suction tube ends in the stomach.  Low volume lungs with persistent right lung perihilar opacities. No definite effusion or pneumothorax.  Unchanged appearance of the cardiomediastinal silhouette, distorted by low lung volumes and leftward rotation.  IMPRESSION:  1.  Improved positioning of endotracheal tube, now 2 cm above the carina. 2.  Persistent right lung opacity, potentially pneumonia.   Original Report Authenticated By: Jorje Guild   Dg Chest Port 1 View  01/17/2013   *RADIOLOGY REPORT*  Clinical Data: Respiratory failure.  PORTABLE CHEST - 1 VIEW  Comparison: None  Findings: Endotracheal tube is present with the tip at the carina. A nasogastric tube extends into the stomach.  The lungs show low volumes with elevation of the right hemidiaphragm.  There is atelectasis versus  infiltrate of the right lung. No pleural effusions are seen.  The heart size is within normal limits.  IMPRESSION: The endotracheal tube tip projects at the carina.  Lungs show low volumes with atelectasis versus infiltrate in the right lung.   Original Report Authenticated By: Aletta Edouard, M.D.         Subjective: Patient continues to complain of substernal chest  pain reproducible on palpation and movement. Denies any worsening shortness breath. States her breathing is as good as usual. Denies fevers, chills, nausea, vomiting, diarrhea, abdominal pain. No dysphagia or odynophagia.  Objective: Filed Vitals:   01/25/13 1837 01/25/13 2237 01/26/13 0544 01/26/13 1031  BP: 150/77 124/66 153/60 130/61  Pulse: 65 58 57 69  Temp: 99 F (37.2 C) 98.1 F (36.7 C) 98.1 F (36.7 C) 98.2 F (36.8 C)  TempSrc: Oral Oral Oral Oral  Resp: 18 18 19 18   Height:      Weight:      SpO2: 95% 98% 100% 98%   No intake or output data in the 24 hours ending 01/26/13 1200 Weight change:  Exam:   General:  Pt is alert, follows commands appropriately, not in acute distress  HEENT: No icterus, No thrush, No neck mass, [place]/AT  Cardiovascular: RRR, S1/S2, no rubs, no gallops  Respiratory: CTA bilaterally, no wheezing, no crackles, no rhonchi  Abdomen: Soft/+BS, non tender, non distended, no guarding  Extremities: No edema, No lymphangitis, No petechiae, No rashes, no synovitis  Data Reviewed: Basic Metabolic Panel:  Recent Labs Lab 01/21/13 0800 01/22/13 0525 01/23/13 0531 01/24/13 0545 01/25/13 0500  NA 133* 134* 133* 135 133*  K 4.4 3.6 3.4* 3.8 3.6  CL 93* 94* 95* 97 96  CO2 30 30 28 27 26   GLUCOSE 246* 174* 192* 147* 144*  BUN 24* 17 14 10 12   CREATININE 0.73 0.77 0.74 0.68 0.65  CALCIUM 9.4 9.2 9.3 9.0 9.0   Liver Function Tests:  Recent Labs Lab 01/22/13 0525  ALBUMIN 2.3*   No results found for this basename: LIPASE, AMYLASE,  in the last 168 hours No results found for this basename: AMMONIA,  in the last 168 hours CBC:  Recent Labs Lab 01/21/13 0800  WBC 12.9*  HGB 10.6*  HCT 32.0*  MCV 80.8  PLT 309   Cardiac Enzymes:  Recent Labs Lab 01/20/13 0308 01/20/13 0830  TROPONINI <0.30 <0.30   BNP: No components found with this basename: POCBNP,  CBG:  Recent Labs Lab 01/25/13 1132 01/25/13 1658 01/25/13 2248  01/26/13 0734 01/26/13 1139  GLUCAP 155* 171* 151* 134* 139*    Recent Results (from the past 240 hour(s))  MRSA PCR SCREENING     Status: None   Collection Time    01/16/13 10:54 PM      Result Value Range Status   MRSA by PCR NEGATIVE  NEGATIVE Final   Comment:            [name] (FDA     approved for NASAL specimens     only), is one component of a     comprehensive MRSA colonization     surveillance program. It is not     intended to diagnose MRSA     infection nor to guide or     monitor treatment for     MRSA infections.  CULTURE, BLOOD (ROUTINE X 2)     Status: None   Collection Time    01/17/13 11:00 AM  Result Value Range Status   Specimen Description BLOOD RIGHT HAND   Final   Special Requests     Final   Value: BOTTLES DRAWN AEROBIC AND ANAEROBIC AERO 10CC ANA 3CC   Culture  Setup Time 01/17/2013 17:38   Final   Culture NO GROWTH 5 DAYS   Final   Report Status 01/23/2013 FINAL   Final  CULTURE, BLOOD (ROUTINE X 2)     Status: None   Collection Time    01/17/13 11:10 AM      Result Value Range Status   Specimen Description BLOOD RIGHT HAND   Final   Special Requests BOTTLES DRAWN AEROBIC ONLY 10CC   Final   Culture  Setup Time 01/17/2013 17:38   Final   Culture NO GROWTH 5 DAYS   Final   Report Status 01/23/2013 FINAL   Final     Scheduled Meds: . amLODipine  5 mg Oral Daily  . antiseptic oral rinse  15 mL Mouth Rinse QID  . carvedilol  3.125 mg Oral BID WC  . chlorhexidine  15 mL Mouth Rinse BID  . docusate sodium  100 mg Oral BID  . enalapril  20 mg Oral BID  . famotidine  20 mg Oral BID  . feeding supplement  237 mL Oral TID WC  . ibuprofen  800 mg Oral TID  . insulin aspart  0-20 Units Subcutaneous TID WC  . MUSCLE RUB   Topical TID  . pantoprazole  40 mg Oral Daily  . phenytoin  200 mg Oral BID  . sucralfate  1 g Oral TID WC & HS   Continuous Infusions:    Naliah Eddington, DO  [facility] Pager [phone]  If  7PM-7AM, please contact night-coverage [email or web address] Password [email or web address] 01/26/2013, 12:00 PM   LOS: 10 days

## 2013-01-26 NOTE — Progress Notes (Signed)
Pt placed on Auto-CPAP with 2 LPM [place] in place.  Pt tolerating well at this time, RT to monitor and assess as needed.

## 2013-01-26 NOTE — Progress Notes (Signed)
Subjective: No further seizures  Exam: Filed Vitals:   01/26/13 1351  BP: 125/61  Pulse: 65  Temp: 98.6 F (37 C)  Resp: 16   Gen: In bed, NAD MS: Awake, alert, oriented and appropriate [id number], EOMI< VFF Motor: 5/5 throughout Sensory:intact to LT.   Impression: 67 yo F with multiple seizures in teh setting of recent SAH. Would continue dilantin at this time, though maybe could consider coming off of it in the future.   Recommendations: 1) Continue dilantin  2) Repeat level tomorrow 3) anticoagulation per neurosurgery, IM.   Roland Rack, MD [facility] [phone]  If 7pm- 7am, please page neurology on call at [phone].

## 2013-01-27 LAB — CBC
HCT: 28.9 % — ABNORMAL LOW (ref 36.0–46.0)
Hemoglobin: 9.1 g/dL — ABNORMAL LOW (ref 12.0–15.0)
MCH: 26.1 pg (ref 26.0–34.0)
MCHC: 31.5 g/dL (ref 30.0–36.0)
MCV: 82.8 fL (ref 78.0–100.0)
Platelets: 326 10*3/uL (ref 150–400)
RBC: 3.49 MIL/uL — ABNORMAL LOW (ref 3.87–5.11)
RDW: 15.4 % (ref 11.5–15.5)
WBC: 9.3 10*3/uL (ref 4.0–10.5)

## 2013-01-27 LAB — PHENYTOIN LEVEL, TOTAL: Phenytoin Lvl: 4.5 ug/mL — ABNORMAL LOW (ref 10.0–20.0)

## 2013-01-27 LAB — GLUCOSE, CAPILLARY
Glucose-Capillary: 137 mg/dL — ABNORMAL HIGH (ref 70–99)
Glucose-Capillary: 201 mg/dL — ABNORMAL HIGH (ref 70–99)

## 2013-01-27 MED ORDER — CARVEDILOL 3.125 MG PO TABS: 3.1250 mg | ORAL_TABLET | Freq: Two times a day (BID) | ORAL | Status: DC

## 2013-01-27 MED ORDER — POLYETHYLENE GLYCOL 3350 17 G PO PACK: 17.0000 g | PACK | Freq: Every day | ORAL | Status: DC | PRN

## 2013-01-27 MED ORDER — RIVAROXABAN 20 MG PO TABS: 20.0000 mg | ORAL_TABLET | Freq: Every day | ORAL | Status: DC

## 2013-01-27 MED ORDER — RIVAROXABAN 15 MG PO TABS: 15.0000 mg | ORAL_TABLET | Freq: Two times a day (BID) | ORAL | Status: DC

## 2013-01-27 MED ORDER — FAMOTIDINE 20 MG PO TABS: 20.0000 mg | ORAL_TABLET | Freq: Two times a day (BID) | ORAL | Status: DC

## 2013-01-27 MED ORDER — ENSURE COMPLETE PO LIQD: 237.0000 mL | Freq: Three times a day (TID) | ORAL | Status: DC

## 2013-01-27 MED ORDER — LEVETIRACETAM 500 MG PO TABS
500.0000 mg | ORAL_TABLET | Freq: Two times a day (BID) | ORAL | Status: DC
  Administered 2013-01-27: 500 mg via ORAL
  Filled 2013-01-27 (×2): qty 1

## 2013-01-27 MED ORDER — OXYCODONE-ACETAMINOPHEN 5-325 MG PO TABS: 1.0000 | ORAL_TABLET | ORAL | Status: DC | PRN

## 2013-01-27 MED ORDER — LEVETIRACETAM 500 MG PO TABS: 500.0000 mg | ORAL_TABLET | Freq: Two times a day (BID) | ORAL | Status: DC

## 2013-01-27 MED ORDER — MORPHINE SULFATE 2 MG/ML IJ SOLN
2.0000 mg | INTRAMUSCULAR | Status: DC | PRN
  Administered 2013-01-27: 2 mg via INTRAVENOUS
  Filled 2013-01-27: qty 1

## 2013-01-27 MED ORDER — PANTOPRAZOLE SODIUM 40 MG PO TBEC: 40.0000 mg | DELAYED_RELEASE_TABLET | Freq: Every day | ORAL | Status: DC

## 2013-01-27 MED ORDER — ACETAMINOPHEN 325 MG PO TABS: 650.0000 mg | ORAL_TABLET | ORAL | Status: DC | PRN

## 2013-01-27 MED ORDER — DSS 100 MG PO CAPS: 100.0000 mg | ORAL_CAPSULE | Freq: Two times a day (BID) | ORAL | Status: DC

## 2013-01-27 NOTE — Progress Notes (Signed)
Physical Therapy Treatment Patient Details Name: Debra Saunders MRN: [id number] DOB: [birth year] Today's Date: 01/27/2013 Time: [id number] PT Time Calculation (min): 23 min  PT Assessment / Plan / Recommendation  History of Present Illness 67 years old female with PMH relevant for DM, HTN, diastolic CHF, PAH, COPD, OSA, obesity. Transferred intubated from [place] with DVT, questionable PE and Small frontal SAH after starting Xarelto.     PT Comments   Unable to encourage pt to attempt standing or transfers OOB to chair etc.  Pain appears to be intense (per family, pt can not take pain), but it does not appear to be worse in any one position, but pt can not be encouraged to try more than sitting EOB.  Follow Up Recommendations  SNF     Does the patient have the potential to tolerate intense rehabilitation     Barriers to Discharge        Equipment Recommendations  None recommended by PT    Recommendations for Other Services    Frequency Min 3X/week   Progress towards PT Goals Progress towards PT goals: Not progressing toward goals - comment (Pt will not push herself to try.)  Plan Current plan remains appropriate    Precautions / Restrictions Precautions Precautions: Fall Restrictions Weight Bearing Restrictions: No   Pertinent Vitals/Pain Pain is high--pt crying out constantly even with morphine for breakthrough pain    Mobility  Bed Mobility Bed Mobility: Supine to Sit;Sitting - Scoot to Edge of Bed;Sit to Supine Supine to Sit: 3: Mod assist;With rails Sitting - Scoot to Edge of Bed: 2: Max assist Sit to Supine: 1: +2 Total assist Sit to Supine: Patient Percentage: 20% Details for Bed Mobility Assistance: More assist needed after pain set in at [facility] Transfers Transfers: Not assessed Details for Transfer Assistance: pt hurting too much to continue, could not convince her to try. Ambulation/Gait Ambulation/Gait Assistance: Not tested (comment) Stairs: No    Exercises      PT Diagnosis:    PT Problem List:   PT Treatment Interventions:     PT Goals (current goals can now be found in the care plan section) Acute Rehab PT Goals PT Goal Formulation: With patient Time For Goal Achievement: 02/01/13 Potential to Achieve Goals: Good  Visit Information  Last PT Received On: 01/27/13 Assistance Needed: +2 History of Present Illness: 67 years old female with PMH relevant for DM, HTN, diastolic CHF, PAH, COPD, OSA, obesity. Transferred intubated from [place] with DVT, questionable PE and Small frontal SAH after starting Xarelto.      Subjective Data  Subjective: I'm only trying b/c I'd like to go to the bathroom.Marland KitchenMarland KitchenI don't care about getting  better/walking, just put me away....   Cognition  Cognition Arousal/Alertness: Awake/alert Behavior During Therapy: WFL for tasks assessed/performed Overall Cognitive Status: Within Functional Limits for tasks assessed    Balance  Static Sitting Balance Static Sitting - Balance Support: Feet supported;Right upper extremity supported;Left upper extremity supported Static Sitting - Level of Assistance: 5: Stand by assistance;Other (comment) (in fact she won't let anyone touch her) Static Sitting - Comment/# of Minutes: 8 min  trying to encourage pt to attempt standing/oob  End of Session PT - End of Session Activity Tolerance: Patient limited by pain Patient left: with call bell/phone within reach;in bed Nurse Communication: Mobility status   GP     Lindey Renzulli, Tessie Fass 01/27/2013, 2:39 PM 01/27/2013  Donnella Sham, PT [phone]  (pager)

## 2013-01-27 NOTE — Discharge Summary (Signed)
Physician Discharge Summary  [facility] [id number] DOB: [birth year] DOA: 01/16/2013  PCP: Debra Mast, MD  Admit date: 01/16/2013 Discharge date: 01/27/2013  Recommendations for Outpatient Follow-up:  1. Pt will need to follow up with PCP in 2 weeks post discharge 2. Please obtain BMP to evaluate electrolytes and kidney function 3. Please also check CBC to evaluate Hg and Hct levels   Discharge Diagnoses:  Principal Problem:   Subarachnoid hemorrhage Active Problems:   Hypertension   Chronic diastolic heart failure   Diabetes mellitus type 2, controlled   Pulmonary hypertension   OSA (obstructive sleep apnea)   Acute respiratory failure   Pulmonary embolus   Musculoskeletal chest pain   Chest pain   Convulsions/seizures   Other convulsions Subarachnoid hemorrhage  -Dr Wynelle Cleveland spoke with neurosurgery- Dr Annette Stable recommends resuming anticoagulation in 2 wks after reassessment of the patient's fall risk -Continue to hold Xarelto-retrievable IVC filter is in place  -patient's mental status is back to baseline  Pulmonary embolus-subsegmental tiny PE  -holding off on Xarelto--plan to restart 01/31/13  -IVC filter in place-no DVT notedon lower extremity duplex July 28  -doubt the PE has any contribution to her chest pain  -01/14/2013 CT angiogram of the chest at [facility] "cannot rule out subsegmental emboli in RLL" -The risks, benefits, and alternatives of restarting reversible and were discussed with the patient and her daughter at the bedside. -I have told them the input from neurosurgery -The patient will be restarted on rivaroxaban 15 mg twice a day x21 days, then rivaroxaban 20 mg once daily starting on day #22 -If the patient is agreeable to take rivaroxaban, the patient's rivaroxaban should be restarted on 01/31/2013 Chest pain-Cardiac workup has been negative. Although PE was found on the CT it is too small to be causing her any pain.  (a) Musculoskeletal  chest pain- was on Naprosyn and Toradol for this, however, began having pain with swallowing which is likely due to esophagitis therefore I have discontinued Naprosyn and Toradol.Will treat chest wall pain with Aspercreme, heating pad and Vicodin- tried ultram but pt had what looked to be a seizure- as Tramadol decreases seizure threshold, it has been discontinued. Pt should not resume it when discharged. Continue scheduled ibuprofen.  (b) probable esophagitis-this pain resolved immediately after a GI cocktail with lidocaine was administered . Therefore will treat this with Protonix daily, Pepcid twice a day and GI cocktail with lidocaine 3 times a day as needed. She should probably continue this treatment for at least a month.   Patient still c/o lower chest pain. Incentive spirometry.  -Rib series negative for fracture  -patient denied any dysphagia or odynophagia Pulmonary infiltrates  -Doubt pneumonia--WBC have decreased without any antibiotics -Patient has no fever, no tachycardia, no shortness of breath  -Infiltrates were present on the day of admission   Seizure  - apparently had rolling back of eyes and shaking of body on 7/30 and 01/21/2013.- see neuro note  Patient noted per neurology to have a seizure. Patient started on dilantin per neuro and dose adjusted.  -EEG negative. Neurology following. -neurology recommended discontinuation of Dilantin and starting the patient on Keppra 500 mg twice a day  Hypertension  -continue current medications -on Coreg, norvasc and enalapril.  Chronic diastolic heart failure/ Pulmonary hypertension  -was given Lasix x 3 doses on 7-29  -echo performed recently at [facility]  Positive blood cultures at [facility]  -apparently pos for strep - Vanc was initially given but d/c'd since  cultures obtained here have been negative  Severe COPD on 2 L of oxygen at home  -Not having an exacerbation at this time  -when necessary albuterol  nebulizers  Diabetes mellitus type 2, controlled  -Takes metformin at home-Currently on sliding scale insulin with meals  -hemoglobin A1c 6.8 on 10/26/2012 -Patient will resume metformin as an outpatient OSA (obstructive sleep apnea)  -CPAP at bedtime  Code Status: full code  Family Communication: sister at bedside  Disposition Plan: SNF 01/27/13   Discharge Condition: stable  Disposition: home  Diet:heart healthy Wt Readings from Last 3 Encounters:  01/20/13 98.4 kg (216 lb 14.9 oz)  12/13/12 101.152 kg (223 lb)  12/01/12 101.152 kg (223 lb)   HPI: 67 years old female with PMH relevant for DM, HTN, diastolic CHF, PAH, COPD, OSA, obesity. Admitted to [facility] about 5 days ago with severe CP that was described more as musculoskeletal. LE dopplers were positive for DVT and a CTA showed a questionable small PE. She was started on Xarelto. Apparently on the day of admission, the patient decompensated acutely with altered mental status, bradycardia with questionable PEA arrest. ACLS was carried out and the patient received 30seconds of chest compressions, before she aroused. The patient was intubated and a CT scan of the head showed a small frontal SAH. The patient was transferred to [facility] for neurosurgical consultation. At the time of CCM examination the patient is intubated, awake, alert, oriented and following commands. No obvious focal neurological signs. The patient continues to complain of CP and and has pain from the ET tube. The patient is hemodynamically stable.the patient was managed in the ICU and subsequently extubated. She has remained off rivaroxaban for the entire [facility] stay.she was transferred to the step down unit on 01/19/2013.she was then transferred to the medical telemetry neurology floor on 01/21/2013.     Consultants: Critical care medicine Neurosurgery-Dr. Annette Stable IR Neurology Discharge Exam: Filed Vitals:   01/27/13 1029  BP: 127/63  Pulse: 64  Temp: 98.3 F (36.8  C)  Resp: 20   Filed Vitals:   01/26/13 1351 01/26/13 2146 01/27/13 0548 01/27/13 1029  BP: 125/61 153/77 150/57 127/63  Pulse: 65 67 55 64  Temp: 98.6 F (37 C) 98.5 F (36.9 C) 97.8 F (36.6 C) 98.3 F (36.8 C)  TempSrc: Oral Oral Oral Oral  Resp: 16 19 18 20   Height:      Weight:      SpO2: 99% 96% 99% 100%   General: A&O x 3, NAD, pleasant, cooperative Cardiovascular: RRR, no rub, no gallop, no S3 Respiratory: diminished breath sounds bilaterally but clear to auscultation. No wheezes. Abdomen:soft, nontender, nondistended, positive bowel sounds Extremities: No edema, No lymphangitis, no petechiae  Discharge Instructions      Discharge Orders   Future Orders Complete By Expires     Diet - low sodium heart healthy  As directed     Increase activity slowly  As directed         Medication List    STOP taking these medications       aspirin 81 MG tablet     chlorthalidone 25 MG tablet  Commonly known as:  HYGROTON     meloxicam 7.5 MG tablet  Commonly known as:  MOBIC      TAKE these medications       acetaminophen 325 MG tablet  Commonly known as:  TYLENOL  Take 2 tablets (650 mg total) by mouth every 4 (four) hours as needed.  albuterol 108 (90 BASE) MCG/ACT inhaler  Commonly known as:  PROVENTIL HFA;VENTOLIN HFA  Inhale 2 puffs into the lungs as needed.     amLODipine 5 MG tablet  Commonly known as:  NORVASC  Take 1 tablet (5 mg total) by mouth daily.     carvedilol 3.125 MG tablet  Commonly known as:  COREG  Take 1 tablet (3.125 mg total) by mouth 2 (two) times daily with a meal.     DSS 100 MG Caps  Take 100 mg by mouth 2 (two) times daily.     enalapril 20 MG tablet  Commonly known as:  VASOTEC  TAKE ONE TABLET BY MOUTH TWICE DAILY     famotidine 20 MG tablet  Commonly known as:  PEPCID  Take 1 tablet (20 mg total) by mouth 2 (two) times daily.     feeding supplement Liqd  Take 237 mLs by mouth 3 (three) times daily with meals.      levETIRAcetam 500 MG tablet  Commonly known as:  KEPPRA  Take 1 tablet (500 mg total) by mouth 2 (two) times daily.     magnesium oxide 400 MG tablet  Commonly known as:  MAG-OX 400  Take 1 tablet (400 mg total) by mouth 2 (two) times daily.     metFORMIN 500 MG (MOD) 24 hr tablet  Commonly known as:  GLUMETZA  Take 1 tablet (500 mg total) by mouth 2 (two) times daily with a meal.     NON FORMULARY  Oxygen 2 liters.     NON FORMULARY  CPAP     oxyCODONE-acetaminophen 5-325 MG per tablet  Commonly known as:  PERCOCET/ROXICET  Take 1-2 tablets by mouth every 4 (four) hours as needed.     pantoprazole 40 MG tablet  Commonly known as:  PROTONIX  Take 1 tablet (40 mg total) by mouth daily.     polyethylene glycol packet  Commonly known as:  MIRALAX / GLYCOLAX  Take 17 g by mouth daily as needed.     Rivaroxaban 15 MG Tabs tablet  Commonly known as:  XARELTO  Take 1 tablet (15 mg total) by mouth 2 (two) times daily with a meal. Start 01/31/13 if patient is agreeable     Rivaroxaban 20 MG Tabs tablet  Commonly known as:  XARELTO  Take 1 tablet (20 mg total) by mouth daily. Start 02/21/13 if patient is agreeable         The results of significant diagnostics from this hospitalization (including imaging, microbiology, ancillary and laboratory) are listed below for reference.    Significant Diagnostic Studies: Dg Ribs Bilateral W/chest  01/25/2013   *RADIOLOGY REPORT*  Clinical Data: Bilateral anterior rib pain.  Recent cardiopulmonary resuscitation 1 week ago.  BILATERAL RIBS AND CHEST - 4+ VIEW  Comparison: Chest x-ray dated 01/18/2013  Findings: There is a faint infiltrate in the right upper lobe well as a smaller area of infiltrate in the left upper lobe.  Heart size is normal.  Main pulmonary arteries are prominent.  Chronic elevation of the right hemidiaphragm.  There are no visible rib fractures.  Degenerative changes present throughout the thoracic spine.  Numerous  calcified granulomas in the mediastinum.  IMPRESSION:  1.  No acute rib fractures. 2.  Faint bilateral upper lobe pulmonary infiltrates. Possibility of aspiration pneumonitis should be considered. There were faint left upper lobe infiltrates on the prior chest CT dated 01/16/2013. 3.  Prominent main pulmonary arteries.   Original Report Authenticated By:  Lorriane Shire, M.D.   Ct Head Wo Contrast  01/19/2013   *RADIOLOGY REPORT*  Clinical Data: Subarachnoid hemorrhage and seizure.  CT HEAD WITHOUT CONTRAST  Technique:  Contiguous axial images were obtained from the base of the skull through the vertex without contrast.  Comparison: 01/18/2013  Findings: There is barely any residual blood visualized in the high right vertex.  There is no evidence of intraventricular blood, hydrocephalus, mass effect, abnormal edema, infarction or subdural blood.  The skull is unremarkable.  IMPRESSION: Minimal residual visualized subarachnoid blood at the high right posterior vertex.  No interval new findings.   Original Report Authenticated By: Aletta Edouard, M.D.   Ct Head Wo Contrast  01/18/2013   *RADIOLOGY REPORT*  Clinical Data: Follow up subarachnoid hemorrhage.  Altered mental status.  CT HEAD WITHOUT CONTRAST  Technique:  Contiguous axial images were obtained from the base of the skull through the vertex without contrast.  Comparison: 01/17/2013.  Findings: The right frontal subarachnoid hemorrhage has become even less obvious than on yesterday's examination.  Small amount of subarachnoid hemorrhage has settled posteriorly over the posterior frontal lobe near the parietal lobe (image number 21 series 2. There is no hydrocephalus.  No mass effect.  No midline shift. Calvarium intact.  The tiny focus of high attenuation in the left frontal lobe is no longer seen, likely due to partial volume averaging.  No infarct.  Paranasal sinuses are within normal limits.  IMPRESSION: Expected evolution of right frontal subarachnoid  hemorrhage, now scarcely visible.  No complicating features such as hydrocephalus.   Original Report Authenticated By: Dereck Ligas, M.D.   Ct Head Wo Contrast  01/17/2013   *RADIOLOGY REPORT*  Clinical Data: Subarachnoid hemorrhage.  Follow-up head CT.  CT HEAD WITHOUT CONTRAST  Technique:  Contiguous axial images were obtained from the base of the skull through the vertex without contrast.  Comparison: 01/16/2013, 1651 hours, [facility]  Findings: Small area of subarachnoid hemorrhage is present in the right frontal lobe.  May be another tiny focus over the left frontal lobe.  There is no hydrocephalus.  There is no skull fracture.  Mastoid air cells and paranasal sinuses appear within normal limits.  No mass lesion, midline shift or infarct.  IMPRESSION: Small focus of right frontal subarachnoid hemorrhage with possible tiny focus of left frontal hemorrhage.  No hydrocephalus. Compared to 01/16/2013 at 1651 hours, the amount of subarachnoid hemorrhage is smaller.   Original Report Authenticated By: Dereck Ligas, M.D.   Ir Ivc Filter Plmt / S&i /img Guid/mod Sed  01/17/2013   *RADIOLOGY REPORT*  IR IVC FILTER PLACEMENT/ S+I/ IMAGE GUIDE MODERATE SEDATION  Date: 02/17/2022 to  Clinical History: 67 year old female with a DVT and pulmonary embolus.  The patient developed a small acute frontal subarachnoid hemorrhage while anticoagulated on Xarelto.  The patient now requires caval interruption for PE prophylaxis given the contraindication to continued anticoagulation.  Procedures Performed: 1. Ultrasound-guided puncture the right internal jugular vein 2.  Inferior venacavagram 3.  Placement of a potentially retrievable IVC filter in an infrarenal location 4.  Follow-up cava gram  Interventional Radiologist:  Criselda Peaches, MD  Sedation: Moderate (conscious) sedation was used.  Two mg Versed, 100 mcg Fentanyl were administered intravenously.  The patient's vital signs were monitored  continuously by radiology nursing throughout the procedure.  Sedation Time: 10 minutes  Fluoroscopy time: 54 seconds  Contrast volume: 55 ml Omnipaque-300  PROCEDURE/FINDINGS:   Informed consent was obtained from the patient following explanation  of the procedure, risks, benefits and alternatives. The patient understands, agrees and consents for the procedure. All questions were addressed. A time out was performed.  Maximal barrier sterile technique utilized including caps, mask, sterile gowns, sterile gloves, large sterile drape, hand hygiene, and betadine skin prep.  The right neck was interrogated with ultrasound.  The right internal jugular vein was found be widely patent.  Local anesthesia was attained by infiltration with 1% lidocaine.  A small dermatotomy was made with a #11 blade.  Under direct fluoroscopic guidance, the vessel was punctured with a 21-gauge micropuncture needle.  Following transition to have 5-French vascular sheath, a 0.035 inches Britta Mccreedy guide wire was advanced into the right common iliac vein.  The tract from the dermatotomy into the internal jugular vein was dilated to 10-French.  The Bard injectable 10-French safe sheath was then advanced over the wire and positioned in the right common iliac vein.  An inferior vena cava gram was performed.  There is no evidence of caval thrombus.  The inflow of the right and left renal veins was successfully identified.  There is no evidence of duplicated inferior vena cava or other vascular abnormality of significance.  The Bard [place] IVC filter was then positioned in an infrarenal location and deployed without complication.  A follow-up inferior venacavagram confirmed infrarenal location of the filter.  The sheath was removed and hemostasis attained by manual pressure. The patient tolerated the procedure well and was returned to her room in stable condition.  IMPRESSION:  Successful placement of an infrarenal potentially retrievable Bard [place] IVC  filter.  Given the patient's age and contraindication to anticoagulation (intracranial hemorrhage), the filter will likely be considered a permanent implant.  Signed,  Criselda Peaches, MD Vascular & Interventional Radiologist [facility]   Original Report Authenticated By: Jacqulynn Cadet, M.D.   Dg Chest Port 1 View  01/18/2013   *RADIOLOGY REPORT*  Clinical Data: Respiratory failure.  PORTABLE CHEST - 1 VIEW  Comparison: 01/17/2013.  Findings: Interval tracheal and esophageal extubation.  Mild cardiomegaly.  Lobulated mediastinal contours, likely vascular when correlated with chest CT 01/16/2013.  There is atherosclerotic calcification and calcified mediastinal lymph nodes.  Low volume lungs with diffuse interstitial prominence.  There are still patchy opacities in the perihilar regions.  No significant effusion.  No pneumothorax.  IMPRESSION:  1. Pulmonary venous congestion  2. Persistent perihilar opacities which could represent atelectasis or pneumonia.   Original Report Authenticated By: Jorje Guild   Dg Chest Port 1 View  01/17/2013   *RADIOLOGY REPORT*  Clinical Data: Endotracheal tube evaluation.  PORTABLE CHEST - 1 VIEW  Comparison: Chest x-ray from yesterday.  Findings: Endotracheal tube ends two centimeters above the carina. Gastric suction tube ends in the stomach.  Low volume lungs with persistent right lung perihilar opacities. No definite effusion or pneumothorax.  Unchanged appearance of the cardiomediastinal silhouette, distorted by low lung volumes and leftward rotation.  IMPRESSION:  1.  Improved positioning of endotracheal tube, now 2 cm above the carina. 2.  Persistent right lung opacity, potentially pneumonia.   Original Report Authenticated By: Jorje Guild   Dg Chest Port 1 View  01/17/2013   *RADIOLOGY REPORT*  Clinical Data: Respiratory failure.  PORTABLE CHEST - 1 VIEW  Comparison: None  Findings: Endotracheal tube is present with the tip at the carina. A  nasogastric tube extends into the stomach.  The lungs show low volumes with elevation of the right hemidiaphragm.  There is atelectasis versus infiltrate of  the right lung. No pleural effusions are seen.  The heart size is within normal limits.  IMPRESSION: The endotracheal tube tip projects at the carina.  Lungs show low volumes with atelectasis versus infiltrate in the right lung.   Original Report Authenticated By: Aletta Edouard, M.D.     Microbiology: No results found for this or any previous visit (from the past 240 hour(s)).   Labs: Basic Metabolic Panel:  Recent Labs Lab 01/22/13 0525 01/23/13 0531 01/24/13 0545 01/25/13 0500 01/26/13 1215  NA 134* 133* 135 133* 136  K 3.6 3.4* 3.8 3.6 3.8  CL 94* 95* 97 96 100  CO2 30 28 27 26 28   GLUCOSE 174* 192* 147* 144* 143*  BUN 17 14 10 12 14   CREATININE 0.77 0.74 0.68 0.65 0.78  CALCIUM 9.2 9.3 9.0 9.0 9.2   Liver Function Tests:  Recent Labs Lab 01/22/13 0525  ALBUMIN 2.3*   No results found for this basename: LIPASE, AMYLASE,  in the last 168 hours No results found for this basename: AMMONIA,  in the last 168 hours CBC:  Recent Labs Lab 01/21/13 0800 01/26/13 1215 01/27/13 0500  WBC 12.9* 11.0* 9.3  HGB 10.6* 9.5* 9.1*  HCT 32.0* 29.2* 28.9*  MCV 80.8 82.3 82.8  PLT 309 370 326   Cardiac Enzymes: No results found for this basename: CKTOTAL, CKMB, CKMBINDEX, TROPONINI,  in the last 168 hours BNP: No components found with this basename: POCBNP,  CBG:  Recent Labs Lab 01/26/13 0734 01/26/13 1139 01/26/13 1610 01/27/13 0712 01/27/13 1225  GLUCAP 134* 139* 113* 137* 201*    Time coordinating discharge:  Greater than 30 minutes  Signed:  Sanaia Jasso, DO [facility] Pager: [phone] 01/27/2013, 3:10 PM

## 2013-01-27 NOTE — Progress Notes (Signed)
Subjective: No further seizures  Exam: Filed Vitals:   01/27/13 0548  BP: 150/57  Pulse: 55  Temp: 97.8 F (36.6 C)  Resp: 18   Gen: In bed, NAD MS: Awake, alert, oriented and appropriate PA:[id number], EOMI< VFF Motor: 5/5 throughout Sensory:intact to LT.   Impression: 67 yo F with multiple seizures in the setting of recent SAH. Would continue AED at this time. With persistently low dilantin levels despitie high doses, I would favor using an agent with more predictable pharmacokinetics.   Recommendations: 1) D/c dilantin 2) Start keppra 500mg  BID 3) anticoagulation per neurosurgery, IM. 4) No further recommendations at this time, she should arrange follow up with a neurologist following discharge and continue keppra until that time.  5) Patient is unable to drive, operate heavy machinery, perform activities at heights or participate in water activities until release by outpatient physician. This was discussed with the patient who expressed understanding.     Roland Rack, MD [facility] [phone]  If 7pm- 7am, please page neurology on call at [phone].

## 2013-01-28 ENCOUNTER — Telehealth: Payer: Self-pay | Admitting: *Deleted

## 2013-01-28 NOTE — Telephone Encounter (Signed)
Left message for patient to call office and schedule an appointment with Dr. Gilford Rile within the next 2 weeks. Need to be a 30 minute visit for a hospital follow up.

## 2013-01-31 NOTE — Telephone Encounter (Signed)
LMTCB

## 2013-02-01 NOTE — Telephone Encounter (Signed)
Spoke with patient daughter [name], she stated patient is currently in [place]. As soon as she comes home she will call to schedule an appointment just as she had planned to do.

## 2013-02-02 ENCOUNTER — Inpatient Hospital Stay: Payer: Self-pay | Admitting: Internal Medicine

## 2013-02-02 LAB — CBC
HCT: 25.6 % — ABNORMAL LOW (ref 35.0–47.0)
HGB: 8.3 g/dL — ABNORMAL LOW (ref 12.0–16.0)
MCH: 26 pg (ref 26.0–34.0)
MCHC: 32.4 g/dL (ref 32.0–36.0)
MCV: 80 fL (ref 80–100)
Platelet: 292 10*3/uL (ref 150–440)
RBC: 3.19 10*6/uL — ABNORMAL LOW (ref 3.80–5.20)
RDW: 15.6 % — ABNORMAL HIGH (ref 11.5–14.5)
WBC: 14.6 10*3/uL — ABNORMAL HIGH (ref 3.6–11.0)

## 2013-02-02 LAB — URINALYSIS, COMPLETE
Glucose,UR: NEGATIVE mg/dL (ref 0–75)
Ketone: NEGATIVE
Leukocyte Esterase: NEGATIVE
Nitrite: NEGATIVE
Ph: 6 (ref 4.5–8.0)
Protein: 25
RBC,UR: 704 /HPF (ref 0–5)
Specific Gravity: 1.03 (ref 1.003–1.030)
Squamous Epithelial: NONE SEEN
WBC UR: 16 /HPF (ref 0–5)

## 2013-02-02 LAB — COMPREHENSIVE METABOLIC PANEL
Albumin: 2.3 g/dL — ABNORMAL LOW (ref 3.4–5.0)
Alkaline Phosphatase: 299 U/L — ABNORMAL HIGH (ref 50–136)
Anion Gap: 13 (ref 7–16)
BUN: 91 mg/dL — ABNORMAL HIGH (ref 7–18)
Bilirubin,Total: 1.4 mg/dL — ABNORMAL HIGH (ref 0.2–1.0)
Calcium, Total: 9.2 mg/dL (ref 8.5–10.1)
Chloride: 89 mmol/L — ABNORMAL LOW (ref 98–107)
Co2: 23 mmol/L (ref 21–32)
Creatinine: 7.42 mg/dL — ABNORMAL HIGH (ref 0.60–1.30)
EGFR (African American): 6 — ABNORMAL LOW
EGFR (Non-African Amer.): 5 — ABNORMAL LOW
Glucose: 161 mg/dL — ABNORMAL HIGH (ref 65–99)
Osmolality: 283 (ref 275–301)
Potassium: 5.4 mmol/L — ABNORMAL HIGH (ref 3.5–5.1)
SGOT(AST): 41 U/L — ABNORMAL HIGH (ref 15–37)
SGPT (ALT): 41 U/L (ref 12–78)
Sodium: 125 mmol/L — ABNORMAL LOW (ref 136–145)
Total Protein: 9.3 g/dL — ABNORMAL HIGH (ref 6.4–8.2)

## 2013-02-02 LAB — TROPONIN I: Troponin-I: 0.34 ng/mL — ABNORMAL HIGH

## 2013-02-03 DIAGNOSIS — R609 Edema, unspecified: Secondary | ICD-10-CM

## 2013-02-03 DIAGNOSIS — R4182 Altered mental status, unspecified: Secondary | ICD-10-CM

## 2013-02-03 DIAGNOSIS — I5032 Chronic diastolic (congestive) heart failure: Secondary | ICD-10-CM

## 2013-02-03 DIAGNOSIS — R7989 Other specified abnormal findings of blood chemistry: Secondary | ICD-10-CM

## 2013-02-03 DIAGNOSIS — I369 Nonrheumatic tricuspid valve disorder, unspecified: Secondary | ICD-10-CM

## 2013-02-03 LAB — BASIC METABOLIC PANEL
Anion Gap: 12 (ref 7–16)
Anion Gap: 11 (ref 7–16)
BUN: 98 mg/dL — ABNORMAL HIGH (ref 7–18)
BUN: 101 mg/dL — ABNORMAL HIGH (ref 7–18)
Calcium, Total: 8.6 mg/dL (ref 8.5–10.1)
Calcium, Total: 8.9 mg/dL (ref 8.5–10.1)
Chloride: 91 mmol/L — ABNORMAL LOW (ref 98–107)
Chloride: 92 mmol/L — ABNORMAL LOW (ref 98–107)
Co2: 23 mmol/L (ref 21–32)
Co2: 22 mmol/L (ref 21–32)
Creatinine: 7.48 mg/dL — ABNORMAL HIGH (ref 0.60–1.30)
Creatinine: 7.45 mg/dL — ABNORMAL HIGH (ref 0.60–1.30)
EGFR (African American): 6 — ABNORMAL LOW
EGFR (African American): 6 — ABNORMAL LOW
EGFR (Non-African Amer.): 5 — ABNORMAL LOW
EGFR (Non-African Amer.): 5 — ABNORMAL LOW
Glucose: 207 mg/dL — ABNORMAL HIGH (ref 65–99)
Glucose: 152 mg/dL — ABNORMAL HIGH (ref 65–99)
Osmolality: 290 (ref 275–301)
Osmolality: 286 (ref 275–301)
Potassium: 5.2 mmol/L — ABNORMAL HIGH (ref 3.5–5.1)
Potassium: 5.5 mmol/L — ABNORMAL HIGH (ref 3.5–5.1)
Sodium: 126 mmol/L — ABNORMAL LOW (ref 136–145)
Sodium: 125 mmol/L — ABNORMAL LOW (ref 136–145)

## 2013-02-03 LAB — CBC WITH DIFFERENTIAL/PLATELET
Basophil #: 0.1 10*3/uL (ref 0.0–0.1)
Basophil #: 0.1 10*3/uL (ref 0.0–0.1)
Basophil %: 0.5 %
Basophil %: 0.4 %
Eosinophil #: 0.2 10*3/uL (ref 0.0–0.7)
Eosinophil #: 0.2 10*3/uL (ref 0.0–0.7)
Eosinophil %: 1.2 %
Eosinophil %: 1.2 %
HCT: 19.8 % — ABNORMAL LOW (ref 35.0–47.0)
HCT: 24.1 % — ABNORMAL LOW (ref 35.0–47.0)
HGB: 6.5 g/dL — ABNORMAL LOW (ref 12.0–16.0)
HGB: 8 g/dL — ABNORMAL LOW (ref 12.0–16.0)
Lymphocyte #: 1 10*3/uL (ref 1.0–3.6)
Lymphocyte #: 0.9 10*3/uL — ABNORMAL LOW (ref 1.0–3.6)
Lymphocyte %: 8.1 %
Lymphocyte %: 6.4 %
MCH: 26.3 pg (ref 26.0–34.0)
MCH: 26.7 pg (ref 26.0–34.0)
MCHC: 33 g/dL (ref 32.0–36.0)
MCHC: 33 g/dL (ref 32.0–36.0)
MCV: 80 fL (ref 80–100)
MCV: 81 fL (ref 80–100)
Monocyte #: 1 x10 3/mm — ABNORMAL HIGH (ref 0.2–0.9)
Monocyte #: 1.5 x10 3/mm — ABNORMAL HIGH (ref 0.2–0.9)
Monocyte %: 7.5 %
Monocyte %: 10.2 %
Neutrophil #: 10.6 10*3/uL — ABNORMAL HIGH (ref 1.4–6.5)
Neutrophil #: 11.8 10*3/uL — ABNORMAL HIGH (ref 1.4–6.5)
Neutrophil %: 82.7 %
Neutrophil %: 81.8 %
Platelet: 266 10*3/uL (ref 150–440)
Platelet: 265 10*3/uL (ref 150–440)
RBC: 2.49 10*6/uL — ABNORMAL LOW (ref 3.80–5.20)
RBC: 2.97 10*6/uL — ABNORMAL LOW (ref 3.80–5.20)
RDW: 15.6 % — ABNORMAL HIGH (ref 11.5–14.5)
RDW: 15.8 % — ABNORMAL HIGH (ref 11.5–14.5)
WBC: 12.8 10*3/uL — ABNORMAL HIGH (ref 3.6–11.0)
WBC: 14.4 10*3/uL — ABNORMAL HIGH (ref 3.6–11.0)

## 2013-02-03 LAB — VANCOMYCIN, RANDOM: Vancomycin, Random: 23 ug/mL

## 2013-02-03 LAB — LIPID PANEL
Cholesterol: 137 mg/dL (ref 0–200)
HDL Cholesterol: 25 mg/dL — ABNORMAL LOW (ref 40–60)
Ldl Cholesterol, Calc: 73 mg/dL (ref 0–100)
Triglycerides: 197 mg/dL (ref 0–200)
VLDL Cholesterol, Calc: 39 mg/dL (ref 5–40)

## 2013-02-03 LAB — CK TOTAL AND CKMB (NOT AT ARMC)
CK, Total: 34 U/L (ref 21–215)
CK, Total: 48 U/L (ref 21–215)
CK, Total: 56 U/L (ref 21–215)
CK-MB: <0.5 ng/mL — ABNORMAL LOW (ref 0.5–3.6)
CK-MB: <0.5 ng/mL — ABNORMAL LOW (ref 0.5–3.6)
CK-MB: <0.5 ng/mL — ABNORMAL LOW (ref 0.5–3.6)

## 2013-02-03 LAB — TROPONIN I
Troponin-I: 0.44 ng/mL — ABNORMAL HIGH
Troponin-I: 0.31 ng/mL — ABNORMAL HIGH
Troponin-I: 0.24 ng/mL — ABNORMAL HIGH

## 2013-02-03 LAB — HEMOGLOBIN A1C: Hemoglobin A1C: 6.2 % (ref 4.2–6.3)

## 2013-02-03 LAB — TSH: Thyroid Stimulating Horm: 0.447 u[IU]/mL — ABNORMAL LOW

## 2013-02-03 LAB — MAGNESIUM: Magnesium: 2.9 mg/dL — ABNORMAL HIGH

## 2013-02-04 LAB — IRON AND TIBC
Iron Bind.Cap.(Total): 240 ug/dL — ABNORMAL LOW (ref 250–450)
Iron Saturation: 10 %
Iron: 25 ug/dL — ABNORMAL LOW (ref 50–170)
Unbound Iron-Bind.Cap.: 215 ug/dL

## 2013-02-04 LAB — BASIC METABOLIC PANEL
Anion Gap: 12 (ref 7–16)
Anion Gap: 9 (ref 7–16)
Anion Gap: 12 (ref 7–16)
Anion Gap: 10 (ref 7–16)
Anion Gap: 9 (ref 7–16)
BUN: 102 mg/dL — ABNORMAL HIGH (ref 7–18)
BUN: 97 mg/dL — ABNORMAL HIGH (ref 7–18)
BUN: 96 mg/dL — ABNORMAL HIGH (ref 7–18)
BUN: 88 mg/dL — ABNORMAL HIGH (ref 7–18)
BUN: 86 mg/dL — ABNORMAL HIGH (ref 7–18)
Calcium, Total: 9 mg/dL (ref 8.5–10.1)
Calcium, Total: 9.1 mg/dL (ref 8.5–10.1)
Calcium, Total: 9.1 mg/dL (ref 8.5–10.1)
Calcium, Total: 8.9 mg/dL (ref 8.5–10.1)
Calcium, Total: 9 mg/dL (ref 8.5–10.1)
Chloride: 92 mmol/L — ABNORMAL LOW (ref 98–107)
Chloride: 93 mmol/L — ABNORMAL LOW (ref 98–107)
Chloride: 95 mmol/L — ABNORMAL LOW (ref 98–107)
Chloride: 95 mmol/L — ABNORMAL LOW (ref 98–107)
Chloride: 95 mmol/L — ABNORMAL LOW (ref 98–107)
Co2: 20 mmol/L — ABNORMAL LOW (ref 21–32)
Co2: 23 mmol/L (ref 21–32)
Co2: 20 mmol/L — ABNORMAL LOW (ref 21–32)
Co2: 22 mmol/L (ref 21–32)
Co2: 23 mmol/L (ref 21–32)
Creatinine: 7.82 mg/dL — ABNORMAL HIGH (ref 0.60–1.30)
Creatinine: 6.42 mg/dL — ABNORMAL HIGH (ref 0.60–1.30)
Creatinine: 6.12 mg/dL — ABNORMAL HIGH (ref 0.60–1.30)
Creatinine: 5.88 mg/dL — ABNORMAL HIGH (ref 0.60–1.30)
Creatinine: 5.65 mg/dL — ABNORMAL HIGH (ref 0.60–1.30)
EGFR (African American): 6 — ABNORMAL LOW
EGFR (African American): 7 — ABNORMAL LOW
EGFR (African American): 7 — ABNORMAL LOW
EGFR (African American): 8 — ABNORMAL LOW
EGFR (African American): 8 — ABNORMAL LOW
EGFR (Non-African Amer.): 5 — ABNORMAL LOW
EGFR (Non-African Amer.): 6 — ABNORMAL LOW
EGFR (Non-African Amer.): 6 — ABNORMAL LOW
EGFR (Non-African Amer.): 7 — ABNORMAL LOW
EGFR (Non-African Amer.): 7 — ABNORMAL LOW
Glucose: 144 mg/dL — ABNORMAL HIGH (ref 65–99)
Glucose: 149 mg/dL — ABNORMAL HIGH (ref 65–99)
Glucose: 144 mg/dL — ABNORMAL HIGH (ref 65–99)
Glucose: 172 mg/dL — ABNORMAL HIGH (ref 65–99)
Glucose: 202 mg/dL — ABNORMAL HIGH (ref 65–99)
Osmolality: 284 (ref 275–301)
Osmolality: 284 (ref 275–301)
Osmolality: 288 (ref 275–301)
Osmolality: 286 (ref 275–301)
Osmolality: 287 (ref 275–301)
Potassium: 5.9 mmol/L — ABNORMAL HIGH (ref 3.5–5.1)
Potassium: 5.5 mmol/L — ABNORMAL HIGH (ref 3.5–5.1)
Potassium: 6.1 mmol/L — ABNORMAL HIGH (ref 3.5–5.1)
Potassium: 5.5 mmol/L — ABNORMAL HIGH (ref 3.5–5.1)
Potassium: 5.6 mmol/L — ABNORMAL HIGH (ref 3.5–5.1)
Sodium: 124 mmol/L — ABNORMAL LOW (ref 136–145)
Sodium: 125 mmol/L — ABNORMAL LOW (ref 136–145)
Sodium: 127 mmol/L — ABNORMAL LOW (ref 136–145)
Sodium: 127 mmol/L — ABNORMAL LOW (ref 136–145)
Sodium: 127 mmol/L — ABNORMAL LOW (ref 136–145)

## 2013-02-04 LAB — CBC WITH DIFFERENTIAL/PLATELET
Basophil #: 0 10*3/uL (ref 0.0–0.1)
Basophil %: 0.3 %
Eosinophil #: 0.3 10*3/uL (ref 0.0–0.7)
Eosinophil %: 2.1 %
HCT: 24 % — ABNORMAL LOW (ref 35.0–47.0)
HGB: 7.9 g/dL — ABNORMAL LOW (ref 12.0–16.0)
Lymphocyte #: 0.9 10*3/uL — ABNORMAL LOW (ref 1.0–3.6)
Lymphocyte %: 7.6 %
MCH: 26.7 pg (ref 26.0–34.0)
MCHC: 33.2 g/dL (ref 32.0–36.0)
MCV: 80 fL (ref 80–100)
Monocyte #: 1 x10 3/mm — ABNORMAL HIGH (ref 0.2–0.9)
Monocyte %: 8.4 %
Neutrophil #: 10.1 10*3/uL — ABNORMAL HIGH (ref 1.4–6.5)
Neutrophil %: 81.6 %
Platelet: 256 10*3/uL (ref 150–440)
RBC: 2.98 10*6/uL — ABNORMAL LOW (ref 3.80–5.20)
RDW: 16.1 % — ABNORMAL HIGH (ref 11.5–14.5)
WBC: 12.3 10*3/uL — ABNORMAL HIGH (ref 3.6–11.0)

## 2013-02-04 LAB — PHOSPHORUS: Phosphorus: 9 mg/dL — ABNORMAL HIGH (ref 2.5–4.9)

## 2013-02-04 LAB — FERRITIN: Ferritin (ARMC): 1286 ng/mL — ABNORMAL HIGH (ref 8–388)

## 2013-02-04 LAB — TROPONIN I: Troponin-I: 0.15 ng/mL — ABNORMAL HIGH

## 2013-02-04 LAB — TSH: Thyroid Stimulating Horm: 0.127 u[IU]/mL — ABNORMAL LOW

## 2013-02-04 LAB — VANCOMYCIN, RANDOM: Vancomycin, Random: 16 ug/mL

## 2013-02-04 LAB — MAGNESIUM: Magnesium: 3.2 mg/dL — ABNORMAL HIGH

## 2013-02-05 LAB — BASIC METABOLIC PANEL
Anion Gap: 7 (ref 7–16)
Anion Gap: 7 (ref 7–16)
Anion Gap: 4 — ABNORMAL LOW (ref 7–16)
BUN: 65 mg/dL — ABNORMAL HIGH (ref 7–18)
BUN: 62 mg/dL — ABNORMAL HIGH (ref 7–18)
BUN: 44 mg/dL — ABNORMAL HIGH (ref 7–18)
Calcium, Total: 8.7 mg/dL (ref 8.5–10.1)
Calcium, Total: 8.8 mg/dL (ref 8.5–10.1)
Calcium, Total: 8.6 mg/dL (ref 8.5–10.1)
Chloride: 96 mmol/L — ABNORMAL LOW (ref 98–107)
Chloride: 97 mmol/L — ABNORMAL LOW (ref 98–107)
Chloride: 100 mmol/L (ref 98–107)
Co2: 26 mmol/L (ref 21–32)
Co2: 26 mmol/L (ref 21–32)
Co2: 29 mmol/L (ref 21–32)
Creatinine: 4.08 mg/dL — ABNORMAL HIGH (ref 0.60–1.30)
Creatinine: 3.56 mg/dL — ABNORMAL HIGH (ref 0.60–1.30)
Creatinine: 2.48 mg/dL — ABNORMAL HIGH (ref 0.60–1.30)
EGFR (African American): 12 — ABNORMAL LOW
EGFR (African American): 14 — ABNORMAL LOW
EGFR (African American): 22 — ABNORMAL LOW
EGFR (Non-African Amer.): 10 — ABNORMAL LOW
EGFR (Non-African Amer.): 12 — ABNORMAL LOW
EGFR (Non-African Amer.): 19 — ABNORMAL LOW
Glucose: 174 mg/dL — ABNORMAL HIGH (ref 65–99)
Glucose: 188 mg/dL — ABNORMAL HIGH (ref 65–99)
Glucose: 136 mg/dL — ABNORMAL HIGH (ref 65–99)
Osmolality: 282 (ref 275–301)
Osmolality: 283 (ref 275–301)
Osmolality: 280 (ref 275–301)
Potassium: 4.6 mmol/L (ref 3.5–5.1)
Potassium: 4.7 mmol/L (ref 3.5–5.1)
Potassium: 4 mmol/L (ref 3.5–5.1)
Sodium: 129 mmol/L — ABNORMAL LOW (ref 136–145)
Sodium: 130 mmol/L — ABNORMAL LOW (ref 136–145)
Sodium: 133 mmol/L — ABNORMAL LOW (ref 136–145)

## 2013-02-05 LAB — CBC WITH DIFFERENTIAL/PLATELET
Basophil #: 0 10*3/uL (ref 0.0–0.1)
Basophil %: 0.1 %
Eosinophil #: 0.3 10*3/uL (ref 0.0–0.7)
Eosinophil %: 2.5 %
HCT: 20.6 % — ABNORMAL LOW (ref 35.0–47.0)
HGB: 6.9 g/dL — ABNORMAL LOW (ref 12.0–16.0)
Lymphocyte #: 0.9 10*3/uL — ABNORMAL LOW (ref 1.0–3.6)
Lymphocyte %: 7 %
MCH: 26.6 pg (ref 26.0–34.0)
MCHC: 33.3 g/dL (ref 32.0–36.0)
MCV: 80 fL (ref 80–100)
Monocyte #: 1.3 x10 3/mm — ABNORMAL HIGH (ref 0.2–0.9)
Monocyte %: 10.5 %
Neutrophil #: 10 10*3/uL — ABNORMAL HIGH (ref 1.4–6.5)
Neutrophil %: 79.9 %
Platelet: 198 10*3/uL (ref 150–440)
RBC: 2.58 10*6/uL — ABNORMAL LOW (ref 3.80–5.20)
RDW: 15.9 % — ABNORMAL HIGH (ref 11.5–14.5)
WBC: 12.5 10*3/uL — ABNORMAL HIGH (ref 3.6–11.0)

## 2013-02-05 LAB — HEMOGLOBIN: HGB: 7.2 g/dL — ABNORMAL LOW (ref 12.0–16.0)

## 2013-02-05 LAB — MAGNESIUM: Magnesium: 2.3 mg/dL

## 2013-02-05 LAB — CULTURE, BLOOD (SINGLE)

## 2013-02-05 LAB — PHOSPHORUS: Phosphorus: 5.2 mg/dL — ABNORMAL HIGH (ref 2.5–4.9)

## 2013-02-06 LAB — BASIC METABOLIC PANEL
Anion Gap: 4 — ABNORMAL LOW (ref 7–16)
Anion Gap: 4 — ABNORMAL LOW (ref 7–16)
Anion Gap: 4 — ABNORMAL LOW (ref 7–16)
Anion Gap: 7 (ref 7–16)
BUN: 40 mg/dL — ABNORMAL HIGH (ref 7–18)
BUN: 45 mg/dL — ABNORMAL HIGH (ref 7–18)
BUN: 46 mg/dL — ABNORMAL HIGH (ref 7–18)
BUN: 53 mg/dL — ABNORMAL HIGH (ref 7–18)
Calcium, Total: 8.3 mg/dL — ABNORMAL LOW (ref 8.5–10.1)
Calcium, Total: 8.3 mg/dL — ABNORMAL LOW (ref 8.5–10.1)
Calcium, Total: 8.4 mg/dL — ABNORMAL LOW (ref 8.5–10.1)
Calcium, Total: 8.5 mg/dL (ref 8.5–10.1)
Chloride: 100 mmol/L (ref 98–107)
Chloride: 100 mmol/L (ref 98–107)
Chloride: 102 mmol/L (ref 98–107)
Chloride: 99 mmol/L (ref 98–107)
Co2: 26 mmol/L (ref 21–32)
Co2: 28 mmol/L (ref 21–32)
Co2: 28 mmol/L (ref 21–32)
Co2: 29 mmol/L (ref 21–32)
Creatinine: 2.03 mg/dL — ABNORMAL HIGH (ref 0.60–1.30)
Creatinine: 2.28 mg/dL — ABNORMAL HIGH (ref 0.60–1.30)
Creatinine: 2.61 mg/dL — ABNORMAL HIGH (ref 0.60–1.30)
Creatinine: 2.83 mg/dL — ABNORMAL HIGH (ref 0.60–1.30)
EGFR (African American): 19 — ABNORMAL LOW
EGFR (African American): 21 — ABNORMAL LOW
EGFR (African American): 25 — ABNORMAL LOW
EGFR (African American): 28 — ABNORMAL LOW
EGFR (Non-African Amer.): 16 — ABNORMAL LOW
EGFR (Non-African Amer.): 18 — ABNORMAL LOW
EGFR (Non-African Amer.): 21 — ABNORMAL LOW
EGFR (Non-African Amer.): 24 — ABNORMAL LOW
Glucose: 135 mg/dL — ABNORMAL HIGH (ref 65–99)
Glucose: 144 mg/dL — ABNORMAL HIGH (ref 65–99)
Glucose: 147 mg/dL — ABNORMAL HIGH (ref 65–99)
Glucose: 225 mg/dL — ABNORMAL HIGH (ref 65–99)
Osmolality: 280 (ref 275–301)
Osmolality: 281 (ref 275–301)
Osmolality: 281 (ref 275–301)
Osmolality: 283 (ref 275–301)
Potassium: 3.9 mmol/L (ref 3.5–5.1)
Potassium: 4 mmol/L (ref 3.5–5.1)
Potassium: 4 mmol/L (ref 3.5–5.1)
Potassium: 4.1 mmol/L (ref 3.5–5.1)
Sodium: 131 mmol/L — ABNORMAL LOW (ref 136–145)
Sodium: 133 mmol/L — ABNORMAL LOW (ref 136–145)
Sodium: 133 mmol/L — ABNORMAL LOW (ref 136–145)
Sodium: 134 mmol/L — ABNORMAL LOW (ref 136–145)

## 2013-02-06 LAB — CBC WITH DIFFERENTIAL/PLATELET
Basophil #: 0.1 10*3/uL (ref 0.0–0.1)
Basophil #: 0.1 10*3/uL (ref 0.0–0.1)
Basophil %: 0.5 %
Basophil %: 0.3 %
Eosinophil #: 0.5 10*3/uL (ref 0.0–0.7)
Eosinophil #: 0.1 10*3/uL (ref 0.0–0.7)
Eosinophil %: 2.9 %
Eosinophil %: 0.6 %
HCT: 18.3 % — ABNORMAL LOW (ref 35.0–47.0)
HCT: 21.3 % — ABNORMAL LOW (ref 35.0–47.0)
HGB: 6.3 g/dL — ABNORMAL LOW (ref 12.0–16.0)
HGB: 7.2 g/dL — ABNORMAL LOW (ref 12.0–16.0)
Lymphocyte #: 1.5 10*3/uL (ref 1.0–3.6)
Lymphocyte #: 0.9 10*3/uL — ABNORMAL LOW (ref 1.0–3.6)
Lymphocyte %: 9.2 %
Lymphocyte %: 4.5 %
MCH: 27.6 pg (ref 26.0–34.0)
MCH: 27.4 pg (ref 26.0–34.0)
MCHC: 34.2 g/dL (ref 32.0–36.0)
MCHC: 33.9 g/dL (ref 32.0–36.0)
MCV: 81 fL (ref 80–100)
MCV: 81 fL (ref 80–100)
Monocyte #: 1.7 x10 3/mm — ABNORMAL HIGH (ref 0.2–0.9)
Monocyte #: 0.9 x10 3/mm (ref 0.2–0.9)
Monocyte %: 10.3 %
Monocyte %: 4.9 %
Neutrophil #: 12.5 10*3/uL — ABNORMAL HIGH (ref 1.4–6.5)
Neutrophil #: 17.4 10*3/uL — ABNORMAL HIGH (ref 1.4–6.5)
Neutrophil %: 77.1 %
Neutrophil %: 89.7 %
Platelet: 187 10*3/uL (ref 150–440)
Platelet: 185 10*3/uL (ref 150–440)
RBC: 2.27 10*6/uL — ABNORMAL LOW (ref 3.80–5.20)
RBC: 2.63 10*6/uL — ABNORMAL LOW (ref 3.80–5.20)
RDW: 16.2 % — ABNORMAL HIGH (ref 11.5–14.5)
RDW: 16.2 % — ABNORMAL HIGH (ref 11.5–14.5)
WBC: 16.2 10*3/uL — ABNORMAL HIGH (ref 3.6–11.0)
WBC: 19.3 10*3/uL — ABNORMAL HIGH (ref 3.6–11.0)

## 2013-02-06 LAB — VANCOMYCIN, TROUGH: Vancomycin, Trough: 18 ug/mL (ref 10–20)

## 2013-02-07 LAB — BASIC METABOLIC PANEL
Anion Gap: 2 — ABNORMAL LOW (ref 7–16)
Anion Gap: 3 — ABNORMAL LOW (ref 7–16)
BUN: 34 mg/dL — ABNORMAL HIGH (ref 7–18)
BUN: 28 mg/dL — ABNORMAL HIGH (ref 7–18)
Calcium, Total: 8.6 mg/dL (ref 8.5–10.1)
Calcium, Total: 8.6 mg/dL (ref 8.5–10.1)
Chloride: 103 mmol/L (ref 98–107)
Chloride: 105 mmol/L (ref 98–107)
Co2: 31 mmol/L (ref 21–32)
Co2: 29 mmol/L (ref 21–32)
Creatinine: 1.74 mg/dL — ABNORMAL HIGH (ref 0.60–1.30)
Creatinine: 1.6 mg/dL — ABNORMAL HIGH (ref 0.60–1.30)
EGFR (African American): 34 — ABNORMAL LOW
EGFR (African American): 38 — ABNORMAL LOW
EGFR (Non-African Amer.): 29 — ABNORMAL LOW
EGFR (Non-African Amer.): 33 — ABNORMAL LOW
Glucose: 132 mg/dL — ABNORMAL HIGH (ref 65–99)
Glucose: 137 mg/dL — ABNORMAL HIGH (ref 65–99)
Osmolality: 281 (ref 275–301)
Osmolality: 281 (ref 275–301)
Potassium: 3.8 mmol/L (ref 3.5–5.1)
Potassium: 3.6 mmol/L (ref 3.5–5.1)
Sodium: 136 mmol/L (ref 136–145)
Sodium: 137 mmol/L (ref 136–145)

## 2013-02-07 LAB — CBC WITH DIFFERENTIAL/PLATELET
Basophil #: 0 10*3/uL (ref 0.0–0.1)
Basophil %: 0.2 %
Eosinophil #: 0 10*3/uL (ref 0.0–0.7)
Eosinophil %: 0.2 %
HCT: 20.7 % — ABNORMAL LOW (ref 35.0–47.0)
HGB: 7.1 g/dL — ABNORMAL LOW (ref 12.0–16.0)
Lymphocyte #: 1 10*3/uL (ref 1.0–3.6)
Lymphocyte %: 4.9 %
MCH: 27.7 pg (ref 26.0–34.0)
MCHC: 34.1 g/dL (ref 32.0–36.0)
MCV: 81 fL (ref 80–100)
Monocyte #: 1.4 x10 3/mm — ABNORMAL HIGH (ref 0.2–0.9)
Monocyte %: 6.6 %
Neutrophil #: 18.5 10*3/uL — ABNORMAL HIGH (ref 1.4–6.5)
Neutrophil %: 88.1 %
Platelet: 181 10*3/uL (ref 150–440)
RBC: 2.54 10*6/uL — ABNORMAL LOW (ref 3.80–5.20)
RDW: 16.3 % — ABNORMAL HIGH (ref 11.5–14.5)
WBC: 21 10*3/uL — ABNORMAL HIGH (ref 3.6–11.0)

## 2013-02-08 LAB — CULTURE, BLOOD (SINGLE)

## 2013-02-08 LAB — CBC WITH DIFFERENTIAL/PLATELET
Basophil #: 0 10*3/uL (ref 0.0–0.1)
Basophil %: 0.1 %
Eosinophil #: 0.1 10*3/uL (ref 0.0–0.7)
Eosinophil %: 0.3 %
HCT: 18.6 % — ABNORMAL LOW (ref 35.0–47.0)
HGB: 6.3 g/dL — ABNORMAL LOW (ref 12.0–16.0)
Lymphocyte #: 1.7 10*3/uL (ref 1.0–3.6)
Lymphocyte %: 8.2 %
MCH: 27.6 pg (ref 26.0–34.0)
MCHC: 33.7 g/dL (ref 32.0–36.0)
MCV: 82 fL (ref 80–100)
Monocyte #: 1.6 x10 3/mm — ABNORMAL HIGH (ref 0.2–0.9)
Monocyte %: 7.5 %
Neutrophil #: 17.6 10*3/uL — ABNORMAL HIGH (ref 1.4–6.5)
Neutrophil %: 83.9 %
Platelet: 157 10*3/uL (ref 150–440)
RBC: 2.27 10*6/uL — ABNORMAL LOW (ref 3.80–5.20)
RDW: 15.8 % — ABNORMAL HIGH (ref 11.5–14.5)
WBC: 21 10*3/uL — ABNORMAL HIGH (ref 3.6–11.0)

## 2013-02-08 LAB — BASIC METABOLIC PANEL
Anion Gap: 2 — ABNORMAL LOW (ref 7–16)
Anion Gap: 5 — ABNORMAL LOW (ref 7–16)
BUN: 24 mg/dL — ABNORMAL HIGH (ref 7–18)
BUN: 28 mg/dL — ABNORMAL HIGH (ref 7–18)
Calcium, Total: 8.5 mg/dL (ref 8.5–10.1)
Calcium, Total: 8.6 mg/dL (ref 8.5–10.1)
Chloride: 103 mmol/L (ref 98–107)
Chloride: 105 mmol/L (ref 98–107)
Co2: 29 mmol/L (ref 21–32)
Co2: 31 mmol/L (ref 21–32)
Creatinine: 1.18 mg/dL (ref 0.60–1.30)
Creatinine: 1.44 mg/dL — ABNORMAL HIGH (ref 0.60–1.30)
EGFR (African American): 43 — ABNORMAL LOW
EGFR (African American): 54 — ABNORMAL LOW
EGFR (Non-African Amer.): 37 — ABNORMAL LOW
EGFR (Non-African Amer.): 47 — ABNORMAL LOW
Glucose: 142 mg/dL — ABNORMAL HIGH (ref 65–99)
Glucose: 165 mg/dL — ABNORMAL HIGH (ref 65–99)
Osmolality: 282 (ref 275–301)
Osmolality: 283 (ref 275–301)
Potassium: 3.9 mmol/L (ref 3.5–5.1)
Potassium: 4 mmol/L (ref 3.5–5.1)
Sodium: 137 mmol/L (ref 136–145)
Sodium: 138 mmol/L (ref 136–145)

## 2013-02-08 LAB — PHOSPHORUS: Phosphorus: 2.1 mg/dL — ABNORMAL LOW (ref 2.5–4.9)

## 2013-02-08 LAB — CLOSTRIDIUM DIFFICILE BY PCR

## 2013-02-09 LAB — CBC WITH DIFFERENTIAL/PLATELET
Basophil #: 0.1 10*3/uL (ref 0.0–0.1)
Basophil %: 0.4 %
Eosinophil #: 0.3 10*3/uL (ref 0.0–0.7)
Eosinophil %: 1.2 %
HCT: 21.5 % — ABNORMAL LOW (ref 35.0–47.0)
HGB: 7.2 g/dL — ABNORMAL LOW (ref 12.0–16.0)
Lymphocyte #: 2.2 10*3/uL (ref 1.0–3.6)
Lymphocyte %: 10.3 %
MCH: 27.6 pg (ref 26.0–34.0)
MCHC: 33.2 g/dL (ref 32.0–36.0)
MCV: 83 fL (ref 80–100)
Monocyte #: 1.7 x10 3/mm — ABNORMAL HIGH (ref 0.2–0.9)
Monocyte %: 8.1 %
Neutrophil #: 17.1 10*3/uL — ABNORMAL HIGH (ref 1.4–6.5)
Neutrophil %: 80 %
Platelet: 153 10*3/uL (ref 150–440)
RBC: 2.59 10*6/uL — ABNORMAL LOW (ref 3.80–5.20)
RDW: 16.3 % — ABNORMAL HIGH (ref 11.5–14.5)
WBC: 21.3 10*3/uL — ABNORMAL HIGH (ref 3.6–11.0)

## 2013-02-09 LAB — BASIC METABOLIC PANEL
Anion Gap: 4 — ABNORMAL LOW (ref 7–16)
BUN: 28 mg/dL — ABNORMAL HIGH (ref 7–18)
Calcium, Total: 8.4 mg/dL — ABNORMAL LOW (ref 8.5–10.1)
Chloride: 102 mmol/L (ref 98–107)
Co2: 30 mmol/L (ref 21–32)
Creatinine: 1.28 mg/dL (ref 0.60–1.30)
EGFR (African American): 49 — ABNORMAL LOW
EGFR (Non-African Amer.): 43 — ABNORMAL LOW
Glucose: 137 mg/dL — ABNORMAL HIGH (ref 65–99)
Osmolality: 280 (ref 275–301)
Potassium: 3.9 mmol/L (ref 3.5–5.1)
Sodium: 136 mmol/L (ref 136–145)

## 2013-02-09 LAB — CK TOTAL AND CKMB (NOT AT ARMC)
CK, Total: 13 U/L — ABNORMAL LOW (ref 21–215)
CK-MB: <0.5 ng/mL — ABNORMAL LOW (ref 0.5–3.6)

## 2013-02-09 LAB — TROPONIN I: Troponin-I: <0.02 ng/mL

## 2013-02-09 LAB — WBCS, STOOL

## 2013-02-10 LAB — BASIC METABOLIC PANEL
Anion Gap: 7 (ref 7–16)
BUN: 38 mg/dL — ABNORMAL HIGH (ref 7–18)
Calcium, Total: 8.2 mg/dL — ABNORMAL LOW (ref 8.5–10.1)
Chloride: 101 mmol/L (ref 98–107)
Co2: 28 mmol/L (ref 21–32)
Creatinine: 1.22 mg/dL (ref 0.60–1.30)
EGFR (African American): 52 — ABNORMAL LOW
EGFR (Non-African Amer.): 45 — ABNORMAL LOW
Glucose: 141 mg/dL — ABNORMAL HIGH (ref 65–99)
Osmolality: 283 (ref 275–301)
Potassium: 3.8 mmol/L (ref 3.5–5.1)
Sodium: 136 mmol/L (ref 136–145)

## 2013-02-10 LAB — CBC WITH DIFFERENTIAL/PLATELET
Basophil #: 0.1 10*3/uL (ref 0.0–0.1)
Basophil %: 0.4 %
Eosinophil #: 0.3 10*3/uL (ref 0.0–0.7)
Eosinophil %: 1.5 %
HCT: 20.9 % — ABNORMAL LOW (ref 35.0–47.0)
HGB: 7 g/dL — ABNORMAL LOW (ref 12.0–16.0)
Lymphocyte #: 2.5 10*3/uL (ref 1.0–3.6)
Lymphocyte %: 11.3 %
MCH: 28 pg (ref 26.0–34.0)
MCHC: 33.8 g/dL (ref 32.0–36.0)
MCV: 83 fL (ref 80–100)
Monocyte #: 1.7 x10 3/mm — ABNORMAL HIGH (ref 0.2–0.9)
Monocyte %: 7.7 %
Neutrophil #: 17.7 10*3/uL — ABNORMAL HIGH (ref 1.4–6.5)
Neutrophil %: 79.1 %
Platelet: 160 10*3/uL (ref 150–440)
RBC: 2.52 10*6/uL — ABNORMAL LOW (ref 3.80–5.20)
RDW: 15.9 % — ABNORMAL HIGH (ref 11.5–14.5)
WBC: 22.4 10*3/uL — ABNORMAL HIGH (ref 3.6–11.0)

## 2013-02-10 LAB — OCCULT BLOOD X 1 CARD TO LAB, STOOL: Occult Blood, Feces: NEGATIVE

## 2013-02-11 LAB — CBC WITH DIFFERENTIAL/PLATELET
Basophil #: 0.1 10*3/uL (ref 0.0–0.1)
Basophil #: 0.1 10*3/uL (ref 0.0–0.1)
Basophil %: 0.4 %
Basophil %: 0.4 %
Eosinophil #: 0.2 10*3/uL (ref 0.0–0.7)
Eosinophil #: 0.2 10*3/uL (ref 0.0–0.7)
Eosinophil %: 1.3 %
Eosinophil %: 1.1 %
HCT: 19.5 % — ABNORMAL LOW (ref 35.0–47.0)
HCT: 23.3 % — ABNORMAL LOW (ref 35.0–47.0)
HGB: 6.6 g/dL — ABNORMAL LOW (ref 12.0–16.0)
HGB: 7.9 g/dL — ABNORMAL LOW (ref 12.0–16.0)
Lymphocyte #: 2.1 10*3/uL (ref 1.0–3.6)
Lymphocyte #: 1.9 10*3/uL (ref 1.0–3.6)
Lymphocyte %: 11.8 %
Lymphocyte %: 10.5 %
MCH: 28 pg (ref 26.0–34.0)
MCH: 28.2 pg (ref 26.0–34.0)
MCHC: 33.7 g/dL (ref 32.0–36.0)
MCHC: 33.8 g/dL (ref 32.0–36.0)
MCV: 83 fL (ref 80–100)
MCV: 84 fL (ref 80–100)
Monocyte #: 1.5 x10 3/mm — ABNORMAL HIGH (ref 0.2–0.9)
Monocyte #: 1.3 x10 3/mm — ABNORMAL HIGH (ref 0.2–0.9)
Monocyte %: 8.2 %
Monocyte %: 7.3 %
Neutrophil #: 14 10*3/uL — ABNORMAL HIGH (ref 1.4–6.5)
Neutrophil #: 15 10*3/uL — ABNORMAL HIGH (ref 1.4–6.5)
Neutrophil %: 78.3 %
Neutrophil %: 80.7 %
Platelet: 175 10*3/uL (ref 150–440)
Platelet: 191 10*3/uL (ref 150–440)
RBC: 2.34 10*6/uL — ABNORMAL LOW (ref 3.80–5.20)
RBC: 2.79 10*6/uL — ABNORMAL LOW (ref 3.80–5.20)
RDW: 16.2 % — ABNORMAL HIGH (ref 11.5–14.5)
RDW: 16 % — ABNORMAL HIGH (ref 11.5–14.5)
WBC: 17.9 10*3/uL — ABNORMAL HIGH (ref 3.6–11.0)
WBC: 18.6 10*3/uL — ABNORMAL HIGH (ref 3.6–11.0)

## 2013-02-11 LAB — BASIC METABOLIC PANEL
Anion Gap: 5 — ABNORMAL LOW (ref 7–16)
BUN: 36 mg/dL — ABNORMAL HIGH (ref 7–18)
Calcium, Total: 7.9 mg/dL — ABNORMAL LOW (ref 8.5–10.1)
Chloride: 102 mmol/L (ref 98–107)
Co2: 28 mmol/L (ref 21–32)
Creatinine: 0.9 mg/dL (ref 0.60–1.30)
EGFR (African American): >60
EGFR (Non-African Amer.): >60
Glucose: 145 mg/dL — ABNORMAL HIGH (ref 65–99)
Osmolality: 281 (ref 275–301)
Potassium: 3.7 mmol/L (ref 3.5–5.1)
Sodium: 135 mmol/L — ABNORMAL LOW (ref 136–145)

## 2013-02-11 LAB — FERRITIN: Ferritin (ARMC): 655 ng/mL — ABNORMAL HIGH (ref 8–388)

## 2013-02-11 LAB — BILIRUBIN, TOTAL: Bilirubin,Total: 0.5 mg/dL (ref 0.2–1.0)

## 2013-02-12 LAB — CBC WITH DIFFERENTIAL/PLATELET
Basophil #: 0.1 10*3/uL (ref 0.0–0.1)
Basophil %: 0.5 %
Eosinophil #: 0.3 10*3/uL (ref 0.0–0.7)
Eosinophil %: 1.7 %
HCT: 24.1 % — ABNORMAL LOW (ref 35.0–47.0)
HGB: 8 g/dL — ABNORMAL LOW (ref 12.0–16.0)
Lymphocyte #: 1.9 10*3/uL (ref 1.0–3.6)
Lymphocyte %: 10.5 %
MCH: 27.7 pg (ref 26.0–34.0)
MCHC: 33.1 g/dL (ref 32.0–36.0)
MCV: 84 fL (ref 80–100)
Monocyte #: 1.3 x10 3/mm — ABNORMAL HIGH (ref 0.2–0.9)
Monocyte %: 7 %
Neutrophil #: 14.3 10*3/uL — ABNORMAL HIGH (ref 1.4–6.5)
Neutrophil %: 80.3 %
Platelet: 219 10*3/uL (ref 150–440)
RBC: 2.89 10*6/uL — ABNORMAL LOW (ref 3.80–5.20)
RDW: 15.9 % — ABNORMAL HIGH (ref 11.5–14.5)
WBC: 17.9 10*3/uL — ABNORMAL HIGH (ref 3.6–11.0)

## 2013-02-12 LAB — CREATININE, SERUM
Creatinine: 0.9 mg/dL (ref 0.60–1.30)
EGFR (African American): >60
EGFR (Non-African Amer.): >60

## 2013-02-12 LAB — CULTURE, BLOOD (SINGLE)

## 2013-02-12 LAB — POTASSIUM: Potassium: 4.2 mmol/L (ref 3.5–5.1)

## 2013-02-14 DIAGNOSIS — R079 Chest pain, unspecified: Secondary | ICD-10-CM

## 2013-02-14 LAB — CBC WITH DIFFERENTIAL/PLATELET
Basophil #: 0.1 10*3/uL (ref 0.0–0.1)
Basophil %: 1 %
Eosinophil #: 0.2 10*3/uL (ref 0.0–0.7)
Eosinophil %: 1.4 %
HCT: 21.6 % — ABNORMAL LOW (ref 35.0–47.0)
HGB: 7.2 g/dL — ABNORMAL LOW (ref 12.0–16.0)
Lymphocyte #: 1.4 10*3/uL (ref 1.0–3.6)
Lymphocyte %: 9.7 %
MCH: 27.7 pg (ref 26.0–34.0)
MCHC: 33.2 g/dL (ref 32.0–36.0)
MCV: 84 fL (ref 80–100)
Monocyte #: 1.2 x10 3/mm — ABNORMAL HIGH (ref 0.2–0.9)
Monocyte %: 8.1 %
Neutrophil #: 11.7 10*3/uL — ABNORMAL HIGH (ref 1.4–6.5)
Neutrophil %: 79.8 %
Platelet: 240 10*3/uL (ref 150–440)
RBC: 2.58 10*6/uL — ABNORMAL LOW (ref 3.80–5.20)
RDW: 15.8 % — ABNORMAL HIGH (ref 11.5–14.5)
WBC: 14.7 10*3/uL — ABNORMAL HIGH (ref 3.6–11.0)

## 2013-02-14 LAB — CK TOTAL AND CKMB (NOT AT ARMC)
CK, Total: 16 U/L — ABNORMAL LOW (ref 21–215)
CK-MB: <0.5 ng/mL — ABNORMAL LOW (ref 0.5–3.6)

## 2013-02-14 LAB — TROPONIN I: Troponin-I: <0.02 ng/mL

## 2013-02-14 LAB — SEDIMENTATION RATE: Erythrocyte Sed Rate: >140 mm/hr — ABNORMAL HIGH (ref 0–30)

## 2013-02-16 ENCOUNTER — Encounter: Payer: Self-pay | Admitting: *Deleted

## 2013-02-16 LAB — HEMOGLOBIN: HGB: 7.1 g/dL — ABNORMAL LOW (ref 12.0–16.0)

## 2013-02-18 LAB — HEMOGLOBIN: HGB: 8 g/dL — ABNORMAL LOW (ref 12.0–16.0)

## 2013-02-21 ENCOUNTER — Ambulatory Visit: Payer: Self-pay | Admitting: Internal Medicine

## 2013-03-07 ENCOUNTER — Ambulatory Visit: Payer: Medicare Other | Admitting: Internal Medicine

## 2013-04-12 ENCOUNTER — Telehealth: Payer: Self-pay | Admitting: *Deleted

## 2013-04-12 ENCOUNTER — Ambulatory Visit (INDEPENDENT_AMBULATORY_CARE_PROVIDER_SITE_OTHER): Payer: Medicare Other | Admitting: Adult Health

## 2013-04-12 ENCOUNTER — Other Ambulatory Visit: Payer: Self-pay | Admitting: Adult Health

## 2013-04-12 ENCOUNTER — Encounter: Payer: Self-pay | Admitting: Adult Health

## 2013-04-12 VITALS — BP 136/80 | HR 74 | Temp 98.2°F | Wt 209.8 lb

## 2013-04-12 DIAGNOSIS — E119 Type 2 diabetes mellitus without complications: Secondary | ICD-10-CM

## 2013-04-12 DIAGNOSIS — R5383 Other fatigue: Secondary | ICD-10-CM

## 2013-04-12 DIAGNOSIS — I10 Essential (primary) hypertension: Secondary | ICD-10-CM

## 2013-04-12 DIAGNOSIS — K5900 Constipation, unspecified: Secondary | ICD-10-CM | POA: Insufficient documentation

## 2013-04-12 DIAGNOSIS — Z09 Encounter for follow-up examination after completed treatment for conditions other than malignant neoplasm: Secondary | ICD-10-CM

## 2013-04-12 DIAGNOSIS — G4733 Obstructive sleep apnea (adult) (pediatric): Secondary | ICD-10-CM

## 2013-04-12 DIAGNOSIS — Z598 Other problems related to housing and economic circumstances: Secondary | ICD-10-CM | POA: Insufficient documentation

## 2013-04-12 DIAGNOSIS — R5381 Other malaise: Secondary | ICD-10-CM

## 2013-04-12 DIAGNOSIS — Z5989 Other problems related to housing and economic circumstances: Secondary | ICD-10-CM

## 2013-04-12 DIAGNOSIS — Z599 Problem related to housing and economic circumstances, unspecified: Secondary | ICD-10-CM | POA: Insufficient documentation

## 2013-04-12 LAB — CBC WITH DIFFERENTIAL/PLATELET
Basophils Absolute: 0 10*3/uL (ref 0.0–0.1)
Basophils Relative: 0.3 % (ref 0.0–3.0)
Eosinophils Absolute: 0.3 10*3/uL (ref 0.0–0.7)
Eosinophils Relative: 3.8 % (ref 0.0–5.0)
HCT: 34.9 % — ABNORMAL LOW (ref 36.0–46.0)
Hemoglobin: 11.1 g/dL — ABNORMAL LOW (ref 12.0–15.0)
Lymphocytes Relative: 23.4 % (ref 12.0–46.0)
Lymphs Abs: 1.9 10*3/uL (ref 0.7–4.0)
MCHC: 31.9 g/dL (ref 30.0–36.0)
MCV: 82.7 fl (ref 78.0–100.0)
Monocytes Absolute: 0.5 10*3/uL (ref 0.1–1.0)
Monocytes Relative: 6.9 % (ref 3.0–12.0)
Neutro Abs: 5.2 10*3/uL (ref 1.4–7.7)
Neutrophils Relative %: 65.6 % (ref 43.0–77.0)
Platelets: 255 10*3/uL (ref 150.0–400.0)
RBC: 4.22 Mil/uL (ref 3.87–5.11)
RDW: 15.4 % — ABNORMAL HIGH (ref 11.5–14.6)
WBC: 7.9 10*3/uL (ref 4.5–10.5)

## 2013-04-12 LAB — BASIC METABOLIC PANEL
BUN: 16 mg/dL (ref 6–23)
CO2: 31 mEq/L (ref 19–32)
Calcium: 9 mg/dL (ref 8.4–10.5)
Chloride: 91 mEq/L — ABNORMAL LOW (ref 96–112)
Creatinine, Ser: 0.9 mg/dL (ref 0.4–1.2)
GFR: 79.66 mL/min (ref >60.00)
Glucose, Bld: 159 mg/dL — ABNORMAL HIGH (ref 70–99)
Potassium: 2.5 mEq/L — CL (ref 3.5–5.1)
Sodium: 136 mEq/L (ref 135–145)

## 2013-04-12 LAB — HEPATIC FUNCTION PANEL
ALT: 14 U/L (ref 0–35)
AST: 29 U/L (ref 0–37)
Albumin: 3.5 g/dL (ref 3.5–5.2)
Alkaline Phosphatase: 80 U/L (ref 39–117)
Bilirubin, Direct: 0.1 mg/dL (ref 0.0–0.3)
Total Bilirubin: 0.7 mg/dL (ref 0.3–1.2)
Total Protein: 7.8 g/dL (ref 6.0–8.3)

## 2013-04-12 MED ORDER — POLYETHYLENE GLYCOL 3350 17 GM/SCOOP PO POWD: ORAL | Status: DC

## 2013-04-12 NOTE — Assessment & Plan Note (Signed)
Consistently using CPAP at bedtime.

## 2013-04-12 NOTE — Assessment & Plan Note (Signed)
Secondary to narcotic use. Patient is currently not taking stool softener or laxative. Stressed importance of bowel regimen to prevent constipation from narcotic use. May use MiraLax daily and adjust dose based on stool consistency. MiraLax safe to take on a daily basis as this is not habit forming. Patient in agreement. Continue to follow.

## 2013-04-12 NOTE — Assessment & Plan Note (Addendum)
Patient has had a long and complicated hospitalization at both [facility] and [facility]. She was discharged from [facility] rehabilitation center on 03/25/2013. She is slowly recovering. She continues to receive home health nursing and physical therapy through [facility]. Patient needs financial assistance with medications. Will need to review cost of medication and potential alternatives to assure patient compliance. She has not been taking her potassium secondary to out-of-pocket cost being $100 for 30 capsules. Patient is on a fixed minimal income. She will call with remaining medications that she reports are outrageously expensive. Patient is currently on narcotics which are extremely constipating. She is currently not taking any stool softener or laxatives. Recommended taking MiraLax and adjusting dose based on her stool consistency. MiraLax safe to take on a daily basis. She may be able to adjust to take either half of the dose or take MiraLax every 2-3 days. Patient reports inability to get in the shower secondary to not being able to lift her left leg. We will contact [facility] to see if they are able to provide patient with a DME that will help the patient shower such as an over-the-tub shower chair. Patient is following a low sodium diabetic diet. She is not taking any of her diabetic medication secondary to recent renal failure. Her blood sugars are currently running below 150. She reports one episode where blood sugar was in the 160s. She continues to monitor these on a daily basis. Patient is using her CPAP at bedtime. Check CBC, basic metabolic panel and hepatic panel. Note, greater than 90 minutes were spent in face-to-face communication with patient in reviewing lengthy and complex hospitalization record, medication management, education, planning and implementation of care.

## 2013-04-12 NOTE — Assessment & Plan Note (Signed)
Patient is having difficulty purchasing expensive medications. She was discharged from the hospital on the potassium capsules that cost her $100 out of pocket. As a result, patient has not purchased the medication and has not taken this in over one week. I have called [facility] and spoke with the pharmacist to discuss alternative to her potassium. Switched potassium to tablets instead of capsules lowering the cost to $22 for 60 Tablets. Notified patient that prescription was changed and available for pickup at pharmacy. Patient is on a fixed minimal income which makes it very difficult for her to purchase expensive medications. Patient could benefit from services from Education officer, [employer]. Will contact [facility] who is providing home health services for her to see if they have social worker available.

## 2013-04-12 NOTE — Progress Notes (Signed)
Subjective:    Patient ID: Debra Saunders, female    DOB: April 30, 1946, 67 y.o.   MRN: [id number]  HPI  Patient is a very pleasant 67 year old female who presents to clinic status post hospitalization. Patient was initially hospitalized at [facility] on 01/11/2013 through 01/16/2013 for severe substernal chest pain and shortness of breath. CT revealed PE and patient was started on Xarelto. On 01/16/2013 patient was noted to have lethargy and slurred speech with subsequent diagnosis of subarachnoid hemorrhage. Patient was transferred to [facility] on 01/16/2013.  Patient also had bilateral DVTs of the lower extremities unable to be anticoagulated secondary to subarachnoid bleed. She is status post inferior vena cava filter. From [facility] patient was discharged to [facility] for rehabilitation on 01/27/2013. She was at this facility for 6 days when she was noted to have altered mental status. Her daughter was present and called 911 which transferred her back to [facility] on 02/02/2013. Patient was found to be in acute renal failure with creatinine of 7.42 secondary to ATN from NSAIDs, ACEI and sepsis 2/2 healthcare-acquired PNA.  She was also hyperkalemic. Patient was admitted to ICU. Septic shock requiring dopamine for ~ 4 days; patient was started on broad-spectrum antibiotics. In the ICU she received CRRT followed by hemodialysis with renal function returning to normal. Acute on chronic anemia status post 3 units of packed red blood cells. She had an ECHO (02/03/13) with mild left ventricular hypertrophy, EF 60-65%, normal right ventricular function, valvular heart disease (trace MVR, mild to moderate TR, mild aortic valve sclerosis without stenosis, mild pulmonic valve regurgitation) mild pulmonary HTN with RVSP 47.2 mgHg, patient was discharged to [facility] rehabilitation on 02/18/2013 and was discharged home on 03/25/2013. She is receiving  home health nursing and physical therapy through [facility].  Patient presents to clinic with one of her daughters. Patient reports that she is slowly recovering. Still having problems with her left lower extremity following her stroke. She is unable to raise her left lower extremity. Patient is able to perform her ADLs with mild limitations, for instance, patient is unable to get into the shower secondary to inability to lift her left leg. She is currently bathing at the sink. Her daughter has meals prepared for patient where she is able to easily warmed them in the microwave. Patient is able to get to the bathroom. She is ambulating with a walker for stability and security. She feels safe at home. She is on continuous oxygen at 2 L via nasal cannula.  Finances are becoming an issue with patient purchasing medication. She reports that the potassium she was discharged from the hospital on cost her $100 out of pocket for 30 capsules. She has been unable to purchase the potassium and reports she has not taken them in over 1 week. She also reports that there are other medications that are also expensive but she is not able to point out which ones they are at the time. She will call me when she gets home to let me know which ones they are to see if there is a possibility of alternatives.   Review of Systems  Constitutional: Positive for appetite change and fatigue. Negative for fever and chills.       Appetite slowly improving  HENT: Negative.   Eyes: Negative.   Respiratory: Positive for shortness of breath. Negative for chest tightness and wheezing.        Shortness of  breath with exertion. Chronic oxygen use.  Cardiovascular: Negative for chest pain, palpitations and leg swelling.  Gastrointestinal: Positive for constipation. Negative for nausea, vomiting, abdominal pain, diarrhea and blood in stool.       Currently not taking any stool softeners or laxatives. She is on  narcotics.   Endocrine: Negative.   Genitourinary: Negative.   Musculoskeletal:       Weakness mainly on the left side following stroke  Skin: Negative.   Neurological: Positive for weakness. Negative for tremors, seizures, syncope, light-headedness and headaches.       LLE weakness; unable to lift left leg.  Hematological: Negative.   Psychiatric/Behavioral: Negative for suicidal ideas, hallucinations, behavioral problems, confusion, sleep disturbance and agitation. The patient is not nervous/anxious.        Objective:   Physical Exam  Constitutional: She is oriented to person, place, and time.  Pleasant 67 year old female. Well dressed and groomed. Slightly weakened-appearing  HENT:  Head: Normocephalic and atraumatic.  Right Ear: External ear normal.  Left Ear: External ear normal.  Mouth/Throat: Oropharynx is clear and moist.  Eyes: Conjunctivae are normal. Pupils are equal, round, and reactive to light.  Cardiovascular: Normal rate, regular rhythm and normal heart sounds.  Exam reveals no gallop.   No murmur heard. Pulmonary/Chest: Effort normal and breath sounds normal. No respiratory distress. She has no wheezes. She has no rales.  Oxygen via nasal cannula at 2 L in use  Abdominal: Soft. Bowel sounds are normal.  Musculoskeletal: She exhibits no tenderness.  Strong grips bilateral UE. Right is slightly stronger than the left. Weakness of LLE 3/5  Lymphadenopathy:    She has no cervical adenopathy.  Neurological: She is alert and oriented to person, place, and time.  Using a walker to ambulate. Able to get up out of a chair to a standing position easily. Steady gait observed with and without walker.  Skin: Skin is warm and dry.  Psychiatric: She has a normal mood and affect. Her behavior is normal. Judgment and thought content normal.          Assessment & Plan:

## 2013-04-12 NOTE — Assessment & Plan Note (Signed)
Patient is well controlled on current medications.

## 2013-04-12 NOTE — Assessment & Plan Note (Signed)
Slowly Improving.  Patient is status post 3 units of packed red blood cells during hospitalization for acute on chronic anemia. She is receiving physical therapy for strength training. Continue to monitor. Check hemoglobin.

## 2013-04-12 NOTE — Telephone Encounter (Signed)
Spoke with pt and notified of potassium level. Advised that [name] will be sending a Rx to [facility]. She did not have a chance to look at her medications yet for those that are costly, will call me back once she is able to determine others that are too expensive

## 2013-04-12 NOTE — Telephone Encounter (Signed)
[facility] called with a critical potassium 2.5. [name] notified.

## 2013-04-12 NOTE — Assessment & Plan Note (Signed)
Currently not on metformin secondary to recent acute renal failure. Will hold off on restarting this. Patient will continue to monitor blood glucose every morning. Blood glucose levels have been less than 150. Continue to monitor

## 2013-04-12 NOTE — Telephone Encounter (Signed)
[place] regarding cost of potassium. Advised to order tablet with cost $22 for 60 tablets. Called Ms. Boyers and advised to take 1 tablet 20 meq twice a day and return on Friday for repeat potassium. Patient able to repeat my instructions. She will call Becky in the morning with other medications that are too expensive to see if alternatives available.

## 2013-04-13 NOTE — Telephone Encounter (Signed)
Correction: generic Keppra $75.15 for 60 tabs

## 2013-04-13 NOTE — Telephone Encounter (Signed)
Keppra brand $489.78 for 60 tabs, $5.15 for generic. Fentanyl patch $230.26 for 10 patches. Pantoprazole $67.95 for 30 pills. Metaxalone $355.86 for 90. Dilaudud $420.06. Promethazine $33.33 for 120.

## 2013-04-14 ENCOUNTER — Telehealth: Payer: Self-pay | Admitting: *Deleted

## 2013-04-14 NOTE — Telephone Encounter (Signed)
Jessica @ [facility] called, stating pt's insurance will not cover any bathroom products and pt would have to pay out of pocket for it. She did not know how much it would cost. I told her to hold off on ordering it due to pt's financial concerns already and that I would let [name] know.

## 2013-04-15 ENCOUNTER — Telehealth: Payer: Self-pay | Admitting: *Deleted

## 2013-04-15 ENCOUNTER — Other Ambulatory Visit (INDEPENDENT_AMBULATORY_CARE_PROVIDER_SITE_OTHER): Payer: Medicare Other

## 2013-04-15 DIAGNOSIS — E876 Hypokalemia: Secondary | ICD-10-CM

## 2013-04-15 LAB — BASIC METABOLIC PANEL
BUN: 14 mg/dL (ref 6–23)
CO2: 34 mEq/L — ABNORMAL HIGH (ref 19–32)
Calcium: 9.3 mg/dL (ref 8.4–10.5)
Chloride: 93 mEq/L — ABNORMAL LOW (ref 96–112)
Creatinine, Ser: 0.8 mg/dL (ref 0.4–1.2)
GFR: 98.31 mL/min (ref >60.00)
Glucose, Bld: 206 mg/dL — ABNORMAL HIGH (ref 70–99)
Potassium: 3.1 mEq/L — ABNORMAL LOW (ref 3.5–5.1)
Sodium: 137 mEq/L (ref 135–145)

## 2013-04-15 NOTE — Telephone Encounter (Signed)
What labs and dx?  

## 2013-04-15 NOTE — Telephone Encounter (Signed)
Please also check with [name], as she just saw her, but from what I can see, needs BMP to recheck potassium, Dx. Hypokalemia

## 2013-04-15 NOTE — Telephone Encounter (Signed)
Bmp hypokalemia 

## 2013-04-15 NOTE — Telephone Encounter (Signed)
Read below  Any additional labs?

## 2013-04-17 DIAGNOSIS — E119 Type 2 diabetes mellitus without complications: Secondary | ICD-10-CM

## 2013-04-17 DIAGNOSIS — I509 Heart failure, unspecified: Secondary | ICD-10-CM

## 2013-04-17 DIAGNOSIS — I609 Nontraumatic subarachnoid hemorrhage, unspecified: Secondary | ICD-10-CM

## 2013-04-17 DIAGNOSIS — G9341 Metabolic encephalopathy: Secondary | ICD-10-CM

## 2013-04-20 NOTE — Telephone Encounter (Signed)
The Keppra is for seizures and this was prescribed by her neurologist. I do not want to make changes to this medication. Please have her call the neurologist office to see if they can recommend alternative. The other medications are for pain. Did they give these to her at the hospital? I do not believe we prescribed those. They are very strong. I would not recommend that she stay on those long term. The metaxalone is a muscle relaxer and, also, she may not need this long term.

## 2013-04-20 NOTE — Telephone Encounter (Signed)
She says she does not have a neurologist, the hospital started that. She only see Dr. Gilford Rile and the pulmonologist. Also states she only has 2 Fentanyl patches remaining and does she need to continue. Has not been using the pain medications, uses only Tylenol because she has very little pain.

## 2013-04-21 ENCOUNTER — Emergency Department: Payer: Self-pay | Admitting: Emergency Medicine

## 2013-04-21 LAB — COMPREHENSIVE METABOLIC PANEL
Albumin: 3.3 g/dL — ABNORMAL LOW (ref 3.4–5.0)
Alkaline Phosphatase: 111 U/L (ref 50–136)
Anion Gap: 7 (ref 7–16)
BUN: 14 mg/dL (ref 7–18)
Bilirubin,Total: 0.4 mg/dL (ref 0.2–1.0)
Calcium, Total: 9.4 mg/dL (ref 8.5–10.1)
Chloride: 99 mmol/L (ref 98–107)
Co2: 30 mmol/L (ref 21–32)
Creatinine: 0.83 mg/dL (ref 0.60–1.30)
EGFR (African American): >60
EGFR (Non-African Amer.): >60
Glucose: 161 mg/dL — ABNORMAL HIGH (ref 65–99)
Osmolality: 276 (ref 275–301)
Potassium: 3.3 mmol/L — ABNORMAL LOW (ref 3.5–5.1)
SGOT(AST): 35 U/L (ref 15–37)
SGPT (ALT): 19 U/L (ref 12–78)
Sodium: 136 mmol/L (ref 136–145)
Total Protein: 8.2 g/dL (ref 6.4–8.2)

## 2013-04-21 LAB — URINALYSIS, COMPLETE
Bacteria: NONE SEEN
Bilirubin,UR: NEGATIVE
Blood: NEGATIVE
Glucose,UR: NEGATIVE mg/dL (ref 0–75)
Ketone: NEGATIVE
Leukocyte Esterase: NEGATIVE
Nitrite: NEGATIVE
Ph: 8 (ref 4.5–8.0)
Protein: NEGATIVE
RBC,UR: <1 /HPF (ref 0–5)
Specific Gravity: 1.005 (ref 1.003–1.030)
Squamous Epithelial: <1
WBC UR: 1 /HPF (ref 0–5)

## 2013-04-21 LAB — CBC
HCT: 36.8 % (ref 35.0–47.0)
HGB: 11.9 g/dL — ABNORMAL LOW (ref 12.0–16.0)
MCH: 26.3 pg (ref 26.0–34.0)
MCHC: 32.3 g/dL (ref 32.0–36.0)
MCV: 81 fL (ref 80–100)
Platelet: 237 10*3/uL (ref 150–440)
RBC: 4.53 10*6/uL (ref 3.80–5.20)
RDW: 15 % — ABNORMAL HIGH (ref 11.5–14.5)
WBC: 6.2 10*3/uL (ref 3.6–11.0)

## 2013-04-22 ENCOUNTER — Encounter: Payer: Self-pay | Admitting: Adult Health

## 2013-04-22 ENCOUNTER — Ambulatory Visit: Payer: Medicare Other | Admitting: Adult Health

## 2013-04-22 ENCOUNTER — Encounter (HOSPITAL_COMMUNITY): Payer: Self-pay | Admitting: Emergency Medicine

## 2013-04-22 ENCOUNTER — Emergency Department (HOSPITAL_COMMUNITY)
Admission: EM | Admit: 2013-04-22 | Discharge: 2013-04-22 | Disposition: A | Discharge status: Home / Self Care | Payer: Medicare Other | Attending: Emergency Medicine | Admitting: Emergency Medicine

## 2013-04-22 ENCOUNTER — Ambulatory Visit (INDEPENDENT_AMBULATORY_CARE_PROVIDER_SITE_OTHER): Payer: Medicare Other | Admitting: Adult Health

## 2013-04-22 ENCOUNTER — Emergency Department (HOSPITAL_COMMUNITY): Payer: Medicare Other

## 2013-04-22 VITALS — BP 108/68 | HR 48 | Temp 98.1°F | Resp 12 | Wt 209.5 lb

## 2013-04-22 DIAGNOSIS — Z8673 Personal history of transient ischemic attack (TIA), and cerebral infarction without residual deficits: Secondary | ICD-10-CM | POA: Insufficient documentation

## 2013-04-22 DIAGNOSIS — H538 Other visual disturbances: Secondary | ICD-10-CM | POA: Insufficient documentation

## 2013-04-22 DIAGNOSIS — J449 Chronic obstructive pulmonary disease, unspecified: Secondary | ICD-10-CM | POA: Insufficient documentation

## 2013-04-22 DIAGNOSIS — I10 Essential (primary) hypertension: Secondary | ICD-10-CM | POA: Insufficient documentation

## 2013-04-22 DIAGNOSIS — R51 Headache: Secondary | ICD-10-CM | POA: Insufficient documentation

## 2013-04-22 DIAGNOSIS — J4489 Other specified chronic obstructive pulmonary disease: Secondary | ICD-10-CM | POA: Insufficient documentation

## 2013-04-22 DIAGNOSIS — I509 Heart failure, unspecified: Secondary | ICD-10-CM | POA: Insufficient documentation

## 2013-04-22 DIAGNOSIS — E119 Type 2 diabetes mellitus without complications: Secondary | ICD-10-CM | POA: Insufficient documentation

## 2013-04-22 DIAGNOSIS — R200 Anesthesia of skin: Secondary | ICD-10-CM

## 2013-04-22 DIAGNOSIS — G8929 Other chronic pain: Secondary | ICD-10-CM | POA: Insufficient documentation

## 2013-04-22 DIAGNOSIS — Z87891 Personal history of nicotine dependence: Secondary | ICD-10-CM | POA: Insufficient documentation

## 2013-04-22 DIAGNOSIS — M129 Arthropathy, unspecified: Secondary | ICD-10-CM | POA: Insufficient documentation

## 2013-04-22 DIAGNOSIS — E669 Obesity, unspecified: Secondary | ICD-10-CM | POA: Insufficient documentation

## 2013-04-22 DIAGNOSIS — R42 Dizziness and giddiness: Secondary | ICD-10-CM

## 2013-04-22 DIAGNOSIS — Z880 Allergy status to penicillin: Secondary | ICD-10-CM | POA: Insufficient documentation

## 2013-04-22 DIAGNOSIS — Z79899 Other long term (current) drug therapy: Secondary | ICD-10-CM | POA: Insufficient documentation

## 2013-04-22 DIAGNOSIS — R209 Unspecified disturbances of skin sensation: Secondary | ICD-10-CM

## 2013-04-22 LAB — CBC
HCT: 36 % (ref 36.0–46.0)
Hemoglobin: 11.2 g/dL — ABNORMAL LOW (ref 12.0–15.0)
MCH: 26.6 pg (ref 26.0–34.0)
MCHC: 31.1 g/dL (ref 30.0–36.0)
MCV: 85.5 fL (ref 78.0–100.0)
Platelets: 221 10*3/uL (ref 150–400)
RBC: 4.21 MIL/uL (ref 3.87–5.11)
RDW: 14.3 % (ref 11.5–15.5)
WBC: 7.2 10*3/uL (ref 4.0–10.5)

## 2013-04-22 LAB — BASIC METABOLIC PANEL
BUN: 15 mg/dL (ref 6–23)
CO2: 27 mEq/L (ref 19–32)
Calcium: 9.1 mg/dL (ref 8.4–10.5)
Chloride: 96 mEq/L (ref 96–112)
Creatinine, Ser: 0.7 mg/dL (ref 0.50–1.10)
GFR calc Af Amer: >90 mL/min (ref >90)
GFR calc non Af Amer: 86 mL/min — ABNORMAL LOW (ref >90)
Glucose, Bld: 165 mg/dL — ABNORMAL HIGH (ref 70–99)
Potassium: 4.1 mEq/L (ref 3.5–5.1)
Sodium: 135 mEq/L (ref 135–145)

## 2013-04-22 LAB — POCT I-STAT TROPONIN I: Troponin i, poc: 0.02 ng/mL (ref 0.00–0.08)

## 2013-04-22 LAB — GLUCOSE, CAPILLARY
Glucose-Capillary: 102 mg/dL — ABNORMAL HIGH (ref 70–99)
Glucose-Capillary: 153 mg/dL — ABNORMAL HIGH (ref 70–99)

## 2013-04-22 MED ORDER — MECLIZINE HCL 50 MG PO TABS: 50.0000 mg | ORAL_TABLET | Freq: Two times a day (BID) | ORAL | Status: DC | PRN

## 2013-04-22 MED ORDER — MECLIZINE HCL 25 MG PO TABS
25.0000 mg | ORAL_TABLET | Freq: Once | ORAL | Status: AC
  Administered 2013-04-22: 25 mg via ORAL
  Filled 2013-04-22: qty 1

## 2013-04-22 NOTE — Progress Notes (Signed)
Subjective:    Patient ID: Debra Saunders, female    DOB: 10/05/1945, 67 y.o.   MRN: [id number]  HPI  Patient is a pleasant 67 y/o female who presents to clinic s/p ED visit yesterday for BLE weakness and similar symptoms of prior hemorrhage. Patient reports numbness and tingling of bilateral face. She also feels a pressure on the top of her head. Reports "feeling as if these areas are not part of my body". She had a CT which showed no acute abnormalities. Pt was sent home but was upset about that. She reports that she is just not feeling right. Her facial symptoms are transient but are occuring regularly that they are concerning her. She is also reporting blurring vision.     Past Medical History  Diagnosis Date  . Thyroid disorder   . Diabetes mellitus     type II  . Thyroid nodule   . Sleep apnea   . Obesity   . Hypertension   . Hyperlipidemia   . Urinary incontinence   . CHF (congestive heart failure) 2005    Dx at [facility]  . Chronic back pain   . COPD (chronic obstructive pulmonary disease)     2L [place]  . Sleep apnea   . Lipoma of back   . Arthritis      Past Surgical History  Procedure Laterality Date  . Cholecystectomy    . Vaginal hysterectomy    . Cardiac catheterization  2013    @ [place]: No obstructive CAD: Only 20% ostial left circumflex.  . Abdominal hysterectomy  1969  . Cyst removal trunk  2002    BACK  . Lipoma removal  2014    back     Family History  Problem Relation Age of Onset  . Heart disease Mother   . Hypertension Mother   . COPD Mother     was a smoker  . Thyroid disease Mother   . Hypertension Father   . Hypertension Sister   . Thyroid disease Sister   . Hypertension Sister   . Thyroid disease Sister   . Breast cancer Maternal Aunt      History   Social History  . Marital Status: Single    Spouse Name: N/A    Number of Children: N/A  . Years of Education: N/A   Occupational History  . Retired- factory work     exposed to [place]  . Smoking status: Former Smoker -- 0.30 packs/day for 2 years    Types: Cigarettes    Quit date: 06/23/1990  . Smokeless tobacco: Not on file  . Alcohol Use: No  . Drug Use: No  . Sexual Activity: Not on file   Other Topics Concern  . Not on file   Social History Narrative   Lives in [place] with daughter. Has 4 children. No pets.      Work - Restaurant manager, fast food, retired      Diet - regular       Review of Systems  Constitutional: Positive for diaphoresis. Negative for fever and chills.  HENT: Negative.   Eyes: Positive for visual disturbance.  Respiratory: Negative.   Cardiovascular: Positive for leg swelling.  Gastrointestinal: Positive for nausea.  Genitourinary: Negative.   Neurological: Positive for weakness, light-headedness and numbness. Negative for dizziness.  Psychiatric/Behavioral: Negative for behavioral problems, confusion and agitation. The patient is nervous/anxious.   All other systems reviewed and are negative.  Objective:   Physical Exam  Constitutional: She is oriented to person, place, and time.  Pleasant 67 y/o female who appears frail. She appears that she is not feeling well  Cardiovascular: Normal rate, regular rhythm and normal heart sounds.  Exam reveals no gallop.   No murmur heard. Pulmonary/Chest: Effort normal and breath sounds normal. No respiratory distress. She has no wheezes. She has no rales.  Abdominal: Soft. Bowel sounds are normal.  Musculoskeletal: She exhibits edema.  Edema 1+ left foot  Neurological: She is alert and oriented to person, place, and time.  Psychiatric: She has a normal mood and affect. Her behavior is normal. Judgment and thought content normal.    BP 108/68  Pulse 48  Temp(Src) 98.1 F (36.7 C) (Oral)  Resp 12  Wt 209 lb 8 oz (95.029 kg)  BMI 39.61 kg/m2  SpO2 97%       Assessment & Plan:

## 2013-04-22 NOTE — ED Provider Notes (Signed)
CSN: [id number]     Arrival date & time 04/22/13  1316 History   First MD Initiated Contact with Patient 04/22/13 1335     Chief Complaint  Patient presents with  . Dizziness   (Consider location/radiation/quality/duration/timing/severity/associated sxs/prior Treatment) HPI Comments: Patient presents with a chief complaint of dizziness and headache.  She report that her symptoms have been present intermittently over the past 2-3 days.  She also reports that today she has also had blurred vision and intermittent double vision.  She reports generalized weakness yesterday, but reports that her weakness has improved at this time.  She denies any focal weakness, numbness, tingling, nausea, vomiting, chest pain, or SOB.  She was seen at [facility] yesterday and had a CT head done at that time, which she reports was negative.  She was again seen by her PCP earlier today and was sent to the ED to obtain a MRI brain.   PMH significant for DM, HTN, Hyperlipidemia, and previous CVA.  The history is provided by the patient.    Past Medical History  Diagnosis Date  . Thyroid disorder   . Diabetes mellitus     type II  . Thyroid nodule   . Sleep apnea   . Obesity   . Hypertension   . Hyperlipidemia   . Urinary incontinence   . CHF (congestive heart failure) 2005    Dx at [facility]  . Chronic back pain   . COPD (chronic obstructive pulmonary disease)     2L [place]  . Sleep apnea   . Lipoma of back   . Arthritis    Past Surgical History  Procedure Laterality Date  . Cholecystectomy    . Vaginal hysterectomy    . Cardiac catheterization  2013    @ [place]: No obstructive CAD: Only 20% ostial left circumflex.  . Abdominal hysterectomy  1969  . Cyst removal trunk  2002    BACK  . Lipoma removal  2014    back   Family History  Problem Relation Age of Onset  . Heart disease Mother   . Hypertension Mother   . COPD Mother     was a smoker  . Thyroid disease Mother   . Hypertension Father    . Hypertension Sister   . Thyroid disease Sister   . Hypertension Sister   . Thyroid disease Sister   . Breast cancer Maternal Aunt    History  Substance Use Topics  . Smoking status: Former Smoker -- 0.30 packs/day for 2 years    Types: Cigarettes    Quit date: 06/23/1990  . Smokeless tobacco: Not on file  . Alcohol Use: No   OB History   Grav Para Term Preterm Abortions TAB SAB Ect Mult Living   4         4     Obstetric Comments   Age first pregnancy 55     Review of Systems  Neurological: Positive for dizziness and headaches. Negative for syncope, facial asymmetry, weakness and numbness.  All other systems reviewed and are negative.    Allergies  Other and Penicillins  Home Medications   Current Outpatient Rx  Name  Route  Sig  Dispense  Refill  . acetaminophen (TYLENOL) 325 MG tablet   Oral   Take 325 mg by mouth every 6 (six) hours as needed for pain.         [name] amLODipine (NORVASC) 5 MG tablet   Oral   Take 1 tablet (  5 mg total) by mouth daily.   30 tablet   6   . carvedilol (COREG) 3.125 MG tablet   Oral   Take 1 tablet (3.125 mg total) by mouth 2 (two) times daily with a meal.   60 tablet   0   . chlorthalidone (HYGROTON) 25 MG tablet   Oral   Take 12.5 mg by mouth daily.         . fentaNYL (DURAGESIC - DOSED MCG/HR) 50 MCG/HR   Transdermal   Place 1 patch onto the skin every 3 (three) days.         . furosemide (LASIX) 20 MG tablet   Oral   Take 20 mg by mouth daily.         [name] HYDROmorphone (DILAUDID) 2 MG tablet   Oral   Take 2 mg by mouth every 4 (four) hours as needed for pain.         [name] levETIRAcetam (KEPPRA) 500 MG tablet   Oral   Take 1 tablet (500 mg total) by mouth 2 (two) times daily.   60 tablet   0   . metaxalone (SKELAXIN) 800 MG tablet   Oral   Take 800 mg by mouth 3 (three) times daily.         . pantoprazole (PROTONIX) 40 MG tablet   Oral   Take 40 mg by mouth daily.         . polyethylene glycol  powder (GLYCOLAX/MIRALAX) powder      Take once daily as needed for constipation   3350 g   1   . potassium chloride SA (K-DUR,KLOR-CON) 20 MEQ tablet   Oral   Take 20 mEq by mouth 2 (two) times daily.          . promethazine (PHENERGAN) 25 MG tablet   Oral   Take 25 mg by mouth every 4 (four) hours as needed for nausea.         . NON FORMULARY      Oxygen 2 liters.         . NON FORMULARY      CPAP          BP 128/58  Pulse 50  Temp(Src) 98.6 F (37 C) (Oral)  Resp 17  SpO2 100% Physical Exam  Nursing note and vitals reviewed. Constitutional: She appears well-developed and well-nourished.  HENT:  Head: Normocephalic and atraumatic.  Mouth/Throat: Oropharynx is clear and moist.  Eyes: EOM are normal. Pupils are equal, round, and reactive to light.  Neck: Normal range of motion. Neck supple.  Cardiovascular: Normal rate, regular rhythm and normal heart sounds.   Pulmonary/Chest: Effort normal and breath sounds normal.  Musculoskeletal: Normal range of motion.  Neurological: She is alert. No cranial nerve deficit or sensory deficit.  Left lower extremity muscle strength 3/5, which patient reports is residual from previous stroke. Right lower extremity muscle strength 5/5 Upper extremity muscle strength 5/5 bilaterally  Skin: Skin is warm and dry.  Psychiatric: She has a normal mood and affect.    ED Course  Procedures (including critical care time) Labs Review Labs Reviewed  CBC - Abnormal; Notable for the following:    Hemoglobin 11.2 (*)    All other components within normal limits  BASIC METABOLIC PANEL  POCT I-STAT TROPONIN I   Imaging Review No results found.  EKG Interpretation   None      4:00 PM Patient signed out to [facility], PA-C at shift change.  MRI brain pending.  MDM  No diagnosis found. Patient with a prior history of CVA presents today with a chief complaint of dizziness and headache.  Symptoms have been occuring  intermittently over the past 2-3 days.  Patient sent to the ED by her PCP to obtain a MRI brain.  Patient with a normal neurological exam aside from LLE weakness, which she reports is baseline for her due to previous CVA.  Labs unremarkable.  Plan is for the patient to be discharged home if MRI is negative.    Hyman Bible, PA-C 04/24/13 [phone]

## 2013-04-22 NOTE — ED Notes (Signed)
Patient transported to MRI 

## 2013-04-22 NOTE — ED Notes (Signed)
Vital signs stable. 

## 2013-04-22 NOTE — ED Provider Notes (Signed)
Discussed case with Hyman Bible, PA-C. Transfer of care from [facility], PA-C at change in shift.   Debra Saunders is a 67 y/o F with PMHx of thyroid disease, DM, thyroid nodule, sleep apnea, HLD, urinary incontinence, CHF, chronic back pain, lipoma of back, arthritis, CVA in July 2014 presenting to the ED with dizziness that has been ongoing for many days. Patient was recommended to come to the ED to get her MRI performed. Patient reported that she has been experiencing dizziness for the past 3 days. Reported that the dizziness is worse with motion. Reported that she had mild blurry vision today, but has improved.   Results for orders placed during the hospital encounter of 04/22/13  CBC      Result Value Range   WBC 7.2  4.0 - 10.5 K/uL   RBC 4.21  3.87 - 5.11 MIL/uL   Hemoglobin 11.2 (*) 12.0 - 15.0 g/dL   HCT 36.0  36.0 - 46.0 %   MCV 85.5  78.0 - 100.0 fL   MCH 26.6  26.0 - 34.0 pg   MCHC 31.1  30.0 - 36.0 g/dL   RDW 14.3  11.5 - 15.5 %   Platelets 221  150 - 400 K/uL  BASIC METABOLIC PANEL      Result Value Range   Sodium 135  135 - 145 mEq/L   Potassium 4.1  3.5 - 5.1 mEq/L   Chloride 96  96 - 112 mEq/L   CO2 27  19 - 32 mEq/L   Glucose, Bld 165 (*) 70 - 99 mg/dL   BUN 15  6 - 23 mg/dL   Creatinine, Ser 0.70  0.50 - 1.10 mg/dL   Calcium 9.1  8.4 - 10.5 mg/dL   GFR calc non Af Amer 86 (*) >90 mL/min   GFR calc Af Amer >90  >90 mL/min  GLUCOSE, CAPILLARY      Result Value Range   Glucose-Capillary 102 (*) 70 - 99 mg/dL  POCT I-STAT TROPONIN I      Result Value Range   Troponin i, poc 0.02  0.00 - 0.08 ng/mL   Comment 3            Mr Brain Wo Contrast  04/22/2013   CLINICAL DATA:  Not feeling well. Bilateral lower extremity weakness. Numbness and tingling of bilateral face.  EXAM: MRI HEAD WITHOUT CONTRAST  TECHNIQUE: Multiplanar, multisequence MR imaging was performed. No intravenous contrast was administered.  COMPARISON:  Most recent CT head 01/19/2013.   FINDINGS: The patient was unable to remain motionless for the exam. Small or subtle lesions could be overlooked.  No acute stroke is evident. There is no definite acute hemorrhage. Slight susceptibility artifact on gradient sequence in the right frontal and right parietal regions correspond to the areas of hemorrhage noted on prior CT.  Mild atrophy. Mild chronic microvascular ischemic change affects the periventricular and subcortical white matter. Scattered areas of bilateral supratentorial chronic lacunar infarction are noted. No midline shift. Flow voids are maintained. No osseous findings. Pituitary and cerebellar tonsils as well as upper cervical region unremarkable. No sinus, orbital, or mastoid disease. Negative scalp and extracranial soft tissues.  IMPRESSION: No acute stroke is evident. Within limits of detection on MR, no acute intracranial hemorrhage. Slight susceptibility artifact over the right convexity likely sequelae of previous subarachnoid hemorrhage with no definite acute process.   Electronically Signed   By: Rolla Flatten M.D.   On: 04/22/2013 18:11   Patient was seen and  assessed by this provider - discussed with patient findings on the MRI that there was no acute stroke or intracranial abnormalities. Patient reported that she continues to feel nauseous. Reported that the dizziness has improved, but reported that there is still some sensation underlying. Discussed case with Dr. Gloris Manchester - recommended that patient be given meclizine and patient be walked around. CBG to be checked.   CBG 13  Alert and oriented. Negative nystagmus. PERRLA bilaterally. Cranial nerves grossly intact. Strength 5+/5+ to upper and lower extremities bilaterally with equal distribution. Negative neurological deficits noted.   Medications  meclizine (ANTIVERT) tablet 25 mg (25 mg Oral Given 04/22/13 2103)   Filed Vitals:   04/22/13 2100  BP: 128/54  Pulse: 67  Temp:   Resp: 14     9:56 PM Patient  responded well to the meclizine, reported that her dizziness has decreased. Patient reported that she is feeling better. Nurse reported that patient did well when moving from the bed to the comode without difficulty. Discussed case with Dr. Gloris Manchester - MRI reviewed by Dr. Gloris Manchester - cleared patient for discharge.  Negative sign of stroke. Suspicion possibly BPV, possible vertigo. Patient able to eat and tolerate food PO. Patient stable, afebrile. Discharged patient with meclizine. Referred patient to neurology and PCP. Discussed with patient to rest and stay hydrated. Discussed with patient to continue to monitor symptoms and if symptoms are to worsen or change to report back to the ED - strict return instructions given.  Patient agreed to plan of care, understood, all questions answered.     Jamse Mead, PA-C 04/23/13 2207

## 2013-04-22 NOTE — Assessment & Plan Note (Signed)
Patient is s/p ED visit yesterday for symptoms of weakness and numbness of face and top of head; blurred vision. CT showed no acute abnormality. MRI was recommended if symptoms persisted. Pt is still having symptoms. Not feeling well. Needs further evaluation. Sent patient to ED at [facility]. Spoke with [name], Triage RN, to advise that patient was on her way.

## 2013-04-22 NOTE — ED Notes (Signed)
Pt c/o HA with some dizziness x 3 days; pt sent here for eval; pt sts seen at [facility] yesterday for same and had CT scan

## 2013-04-22 NOTE — ED Notes (Addendum)
Pt NOT ambulated because pt unable to tolerate

## 2013-04-22 NOTE — ED Notes (Signed)
Pt taken back to MRI; MRI states they did not take the second set of scans when she was originally over there

## 2013-04-24 NOTE — ED Provider Notes (Signed)
Medical screening examination/treatment/procedure(s) were performed by non-physician practitioner and as supervising physician I was immediately available for consultation/collaboration.  EKG Interpretation     Ventricular Rate:  59 PR Interval:  154 QRS Duration: 80 QT Interval:  452 QTC Calculation: 447 R Axis:   5 Text Interpretation:  Sinus bradycardia Otherwise normal ECG ED PHYSICIAN INTERPRETATION AVAILABLE IN [place] W. Muriel Hannold, MD 04/24/13 1737

## 2013-04-25 NOTE — ED Provider Notes (Signed)
Medical screening examination/treatment/procedure(s) were performed by non-physician practitioner and as supervising physician I was immediately available for consultation/collaboration.   Leota Jacobsen, MD 04/25/13 [phone]

## 2013-04-27 ENCOUNTER — Ambulatory Visit (INDEPENDENT_AMBULATORY_CARE_PROVIDER_SITE_OTHER): Payer: Medicare Other | Admitting: Internal Medicine

## 2013-04-27 ENCOUNTER — Encounter: Payer: Self-pay | Admitting: Internal Medicine

## 2013-04-27 VITALS — BP 120/70 | HR 58 | Temp 98.4°F | Wt 208.0 lb

## 2013-04-27 DIAGNOSIS — R079 Chest pain, unspecified: Secondary | ICD-10-CM

## 2013-04-27 DIAGNOSIS — R569 Unspecified convulsions: Secondary | ICD-10-CM

## 2013-04-27 DIAGNOSIS — E119 Type 2 diabetes mellitus without complications: Secondary | ICD-10-CM

## 2013-04-27 DIAGNOSIS — G8929 Other chronic pain: Secondary | ICD-10-CM

## 2013-04-27 DIAGNOSIS — I609 Nontraumatic subarachnoid hemorrhage, unspecified: Secondary | ICD-10-CM

## 2013-04-27 LAB — COMPREHENSIVE METABOLIC PANEL
ALT: 13 U/L (ref 0–35)
AST: 18 U/L (ref 0–37)
Albumin: 3.3 g/dL — ABNORMAL LOW (ref 3.5–5.2)
Alkaline Phosphatase: 79 U/L (ref 39–117)
BUN: 17 mg/dL (ref 6–23)
CO2: 32 mEq/L (ref 19–32)
Calcium: 9.3 mg/dL (ref 8.4–10.5)
Chloride: 98 mEq/L (ref 96–112)
Creatinine, Ser: 0.7 mg/dL (ref 0.4–1.2)
GFR: 108.23 mL/min (ref >60.00)
Glucose, Bld: 127 mg/dL — ABNORMAL HIGH (ref 70–99)
Potassium: 3.5 mEq/L (ref 3.5–5.1)
Sodium: 136 mEq/L (ref 135–145)
Total Bilirubin: 0.2 mg/dL — ABNORMAL LOW (ref 0.3–1.2)
Total Protein: 7.6 g/dL (ref 6.0–8.3)

## 2013-04-27 LAB — HEMOGLOBIN A1C: Hgb A1c MFr Bld: 6.8 % — ABNORMAL HIGH (ref 4.6–6.5)

## 2013-04-27 MED ORDER — PANTOPRAZOLE SODIUM 40 MG PO TBEC: 40.0000 mg | DELAYED_RELEASE_TABLET | Freq: Every day | ORAL | Status: DC

## 2013-04-27 MED ORDER — FENTANYL 25 MCG/HR TD PT72: 25.0000 ug | MEDICATED_PATCH | TRANSDERMAL | Status: DC

## 2013-04-27 NOTE — Progress Notes (Signed)
Subjective:    Patient ID: Debra Saunders, female    DOB: 03/10/1946, 67 y.o.   MRN: [id number]  HPI 67YO female with history of CVA, subarachnoid hemorrhage, seizures, PE with cardiac arrest, DVT, presents for follow up after visit last week during which she was noted to have numbness over her scalp, lightheadedness and intermittent shaking of her right arm.  (Summarized from previous notes) Debra Saunders was initially hospitalized at [facility] on 01/11/2013 through 01/16/2013 for severe substernal chest pain and shortness of breath, with cardiac arrest requiring CPR. CT revealed PE and patient was started on Xarelto. On 01/16/2013 patient was noted to have lethargy and slurred speech with subsequent diagnosis of subarachnoid hemorrhage. She was transferred to [facility] on 01/16/2013. Patient also had bilateral DVTs of the lower extremities unable to be anticoagulated secondary to subarachnoid bleed. She is status post inferior vena cava filter. From Debra Saunders patient was discharged to [facility] for rehabilitation on 01/27/2013. She was at this facility for 6 days when she was noted to have altered mental status. Her daughter was present and called 911 which transferred her back to [facility] on 02/02/2013. Patient was found to be in acute renal failure with creatinine of 7.42 secondary to ATN from NSAIDs, ACEI and sepsis 2/2 healthcare-acquired PNA. She was also hyperkalemic. Patient was admitted to ICU. Septic shock requiring dopamine for ~ 4 days; patient was started on broad-spectrum antibiotics. In the ICU she received CRRT followed by hemodialysis with renal function returning to normal. Acute on chronic anemia status post 3 units of packed red blood cells. She had an ECHO (02/03/13) with mild left ventricular hypertrophy, EF 60-65%, normal right ventricular function, valvular heart disease (trace MVR, mild to moderate TR, mild aortic valve  sclerosis without stenosis, mild pulmonic valve regurgitation) mild pulmonary HTN with RVSP 47.2 mgHg, patient was discharged to [facility] rehabilitation on 02/18/2013 and was discharged home on 03/25/2013.  She returned to clinic last Friday is across the top of her scalp and intermittent jerking movements of her right arm. She was sent back to the [facility] emergency department and underwent MRI of the brain. MRI of the brain showed no acute findings. Labs were normal. The patient was discharged home and instructed to followup with neurology. She has no local neurologist. Over the last few days, she reports continued episodes of intermittent numbness or tingling across the top of her scalp and down her cheeks bilaterally. She denies any repeated jerking movements. She denies any syncope but does occasionally feel lightheaded.  She also notes that during her initial hospitalization she was started on a fentanyl patch for chest pain that occurred after undergoing CPR. She is currently on fentanyl patch 50 mcg per hour. She would like to get off this patch of possible. She denies any current chest pain. She denies shortness of breath.  Outpatient Encounter Prescriptions as of 04/27/2013  Medication Sig  . acetaminophen (TYLENOL) 325 MG tablet Take 325 mg by mouth every 6 (six) hours as needed for pain.  [name] amLODipine (NORVASC) 5 MG tablet Take 1 tablet (5 mg total) by mouth daily.  . carvedilol (COREG) 3.125 MG tablet Take 1 tablet (3.125 mg total) by mouth 2 (two) times daily with a meal.  . chlorthalidone (HYGROTON) 25 MG tablet Take 12.5 mg by mouth daily.  . furosemide (LASIX) 20 MG tablet Take 20 mg by mouth daily.  [name] HYDROmorphone (DILAUDID) 2 MG tablet Take 2 mg  by mouth every 4 (four) hours as needed for pain.  [name] levETIRAcetam (KEPPRA) 500 MG tablet Take 1 tablet (500 mg total) by mouth 2 (two) times daily.  . metaxalone (SKELAXIN) 800 MG tablet Take 800 mg by mouth 3 (three) times daily.  .  NON FORMULARY Oxygen 2 liters.  . NON FORMULARY CPAP  . pantoprazole (PROTONIX) 40 MG tablet Take 1 tablet (40 mg total) by mouth daily.  . polyethylene glycol powder (GLYCOLAX/MIRALAX) powder Take once daily as needed for constipation  . potassium chloride SA (K-DUR,KLOR-CON) 20 MEQ tablet Take 20 mEq by mouth 2 (two) times daily.   . promethazine (PHENERGAN) 25 MG tablet Take 25 mg by mouth every 4 (four) hours as needed for nausea.  . fentaNYL (DURAGESIC - DOSED MCG/HR) 25 MCG/HR patch Place 1 patch (25 mcg total) onto the skin every 3 (three) days.  . meclizine (ANTIVERT) 50 MG tablet Take 1 tablet (50 mg total) by mouth 2 (two) times daily as needed for dizziness.   BP 120/70  Pulse 58  Temp(Src) 98.4 F (36.9 C) (Oral)  Wt 208 lb (94.348 kg)  SpO2 97%   Review of Systems  Constitutional: Positive for fatigue. Negative for fever, chills, appetite change and unexpected weight change.  HENT: Negative for congestion, ear pain, sinus pressure, sore throat, trouble swallowing and voice change.   Eyes: Negative for visual disturbance.  Respiratory: Negative for cough, shortness of breath, wheezing and stridor.   Cardiovascular: Positive for leg swelling (left >right chronic). Negative for chest pain and palpitations.  Gastrointestinal: Negative for nausea, vomiting, abdominal pain, diarrhea, constipation, blood in stool, abdominal distention and anal bleeding.  Genitourinary: Negative for dysuria and flank pain.  Musculoskeletal: Negative for arthralgias, gait problem, myalgias and neck pain.  Skin: Negative for color change and rash.  Neurological: Positive for weakness, light-headedness and numbness. Negative for dizziness, tremors, seizures, syncope, speech difficulty and headaches.  Hematological: Negative for adenopathy. Does not bruise/bleed easily.  Psychiatric/Behavioral: Negative for suicidal ideas, sleep disturbance and dysphoric mood. The patient is not nervous/anxious.         Objective:   Physical Exam  Constitutional: She is oriented to person, place, and time. She appears well-developed and well-nourished. No distress.  HENT:  Head: Normocephalic and atraumatic.  Right Ear: External ear normal.  Left Ear: External ear normal.  Nose: Nose normal.  Mouth/Throat: Oropharynx is clear and moist. No oropharyngeal exudate.  Eyes: Conjunctivae are normal. Pupils are equal, round, and reactive to light. Right eye exhibits no discharge. Left eye exhibits no discharge. No scleral icterus.  Neck: Normal range of motion. Neck supple. No tracheal deviation present. No thyromegaly present.  Cardiovascular: Normal rate, regular rhythm, normal heart sounds and intact distal pulses.  Exam reveals no gallop and no friction rub.   No murmur heard. Pulmonary/Chest: Effort normal and breath sounds normal. No accessory muscle usage. Not tachypneic. No respiratory distress. She has no decreased breath sounds. She has no wheezes. She has no rhonchi. She has no rales. She exhibits no tenderness.  Musculoskeletal: Normal range of motion. She exhibits edema (left > right). She exhibits no tenderness.  Lymphadenopathy:    She has no cervical adenopathy.  Neurological: She is alert and oriented to person, place, and time. No cranial nerve deficit. She exhibits normal muscle tone. Coordination normal.  Skin: Skin is warm and dry. No rash noted. She is not diaphoretic. No erythema. No pallor.  Psychiatric: She has a normal mood and affect. Her  behavior is normal. Judgment and thought content normal.          Assessment & Plan:

## 2013-04-27 NOTE — Assessment & Plan Note (Signed)
Patient noted to have seizure activity with recent subarachnoid hemorrhage. She is currently on Keppra. She is having episodes of numbness across her scalp and had intermittent jerking movements of her right arm last week. Will set up neurology evaluation. Question if she may need additional antiepileptic medication.

## 2013-04-27 NOTE — Assessment & Plan Note (Signed)
Symptoms of chest wall pain after CPR have improved. Will try tapering fentanyl patch down from 50 mcg per hour to 25 mcg per hour. Patient will followup in 2 weeks. She will call if symptoms are worsening with declining dose of fentanyl.

## 2013-04-27 NOTE — Assessment & Plan Note (Signed)
Will check A1c with labs today. We'll continue off metformin because of recent episode of acute renal failure.

## 2013-04-27 NOTE — Assessment & Plan Note (Signed)
Recent episode of subarachnoid hemorrhage in the setting of using Xarelto for PE/DVT. Reviewed MRI brain from 04/22/2013 which showed no new acute changes. Will continue to avoid anticoagulants. IVC filter is in place.

## 2013-04-27 NOTE — Progress Notes (Signed)
Pre-visit discussion using our clinic review tool. No additional management support is needed unless otherwise documented below in the visit note.  

## 2013-04-28 ENCOUNTER — Other Ambulatory Visit: Payer: Self-pay

## 2013-05-09 ENCOUNTER — Other Ambulatory Visit: Payer: Self-pay | Admitting: Cardiovascular Disease

## 2013-05-09 ENCOUNTER — Other Ambulatory Visit: Payer: Self-pay | Admitting: *Deleted

## 2013-05-09 MED ORDER — FUROSEMIDE 20 MG PO TABS: 20.0000 mg | ORAL_TABLET | Freq: Every day | ORAL | Status: DC

## 2013-05-09 NOTE — Telephone Encounter (Signed)
Requested Prescriptions   Signed Prescriptions Disp Refills  . furosemide (LASIX) 20 MG tablet 30 tablet 3    Sig: Take 1 tablet (20 mg total) by mouth daily.    Authorizing Provider: [name] A    Ordering User: Britt Bottom

## 2013-05-12 ENCOUNTER — Encounter: Payer: Self-pay | Admitting: Adult Health

## 2013-05-12 ENCOUNTER — Ambulatory Visit: Payer: Medicare Other | Admitting: Internal Medicine

## 2013-05-12 ENCOUNTER — Ambulatory Visit (INDEPENDENT_AMBULATORY_CARE_PROVIDER_SITE_OTHER): Payer: Medicare Other | Admitting: Adult Health

## 2013-05-12 VITALS — BP 126/84 | HR 74 | Temp 98.4°F | Wt 211.0 lb

## 2013-05-12 DIAGNOSIS — R5383 Other fatigue: Secondary | ICD-10-CM

## 2013-05-12 DIAGNOSIS — Z7409 Other reduced mobility: Secondary | ICD-10-CM

## 2013-05-12 DIAGNOSIS — Z789 Other specified health status: Secondary | ICD-10-CM

## 2013-05-12 DIAGNOSIS — R079 Chest pain, unspecified: Secondary | ICD-10-CM

## 2013-05-12 DIAGNOSIS — Z79899 Other long term (current) drug therapy: Secondary | ICD-10-CM

## 2013-05-12 DIAGNOSIS — R69 Illness, unspecified: Secondary | ICD-10-CM

## 2013-05-12 DIAGNOSIS — R5381 Other malaise: Secondary | ICD-10-CM

## 2013-05-12 MED ORDER — CARVEDILOL 3.125 MG PO TABS: 3.1250 mg | ORAL_TABLET | Freq: Two times a day (BID) | ORAL | Status: DC

## 2013-05-12 MED ORDER — CHLORTHALIDONE 25 MG PO TABS: 12.5000 mg | ORAL_TABLET | Freq: Every day | ORAL | Status: DC

## 2013-05-12 MED ORDER — POTASSIUM CHLORIDE CRYS ER 20 MEQ PO TBCR: 20.0000 meq | EXTENDED_RELEASE_TABLET | Freq: Two times a day (BID) | ORAL | Status: DC

## 2013-05-12 NOTE — Progress Notes (Signed)
  Subjective:    Patient ID: Debra Saunders, female    DOB: 20-Aug-1945, 67 y.o.   MRN: [id number]  HPI  Patient is a pleasant 67 year old female who presents to clinic for 2 week followup visit for chest pain occurring after undergoing CPR during recent hospitalization. Patient had been on fentanyl patch 50 mcg every 72 hours. Patient had been titrated down to 25 mcg every 72 hours. She reports that chest pain is improving. It is not completely resolved; however, the current dose of the patch is alleviating her symptoms. Patient still reports ongoing fatigue. She had a lengthy, complicated hospitalization recently.  She also reports needing refill on several of her medications.   Review of Systems  Constitutional: Positive for fatigue. Negative for fever.  HENT: Negative.   Respiratory: Positive for shortness of breath.        Oxygen use  Cardiovascular: Positive for leg swelling. Negative for chest pain.  Gastrointestinal: Negative.   Neurological: Positive for weakness. Negative for dizziness, tremors, syncope and light-headedness.  Psychiatric/Behavioral: Negative.   All other systems reviewed and are negative.       Objective:   Physical Exam  Constitutional: She is oriented to person, place, and time. No distress.  Cardiovascular: Normal rate, regular rhythm and normal heart sounds.  Exam reveals no gallop.   No murmur heard. Pulmonary/Chest: Effort normal and breath sounds normal. No respiratory distress. She has no wheezes. She has no rales.  Abdominal: Soft.  Musculoskeletal: She exhibits edema.  2+ edema left lower extremity. Compression stockings in use  Neurological: She is alert and oriented to person, place, and time.  Psychiatric: She has a normal mood and affect. Her behavior is normal. Judgment and thought content normal.    BP 126/84  Pulse 74  Temp(Src) 98.4 F (36.9 C) (Oral)  Wt 211 lb (95.709 kg)  SpO2 93%       Assessment & Plan:

## 2013-05-12 NOTE — Assessment & Plan Note (Signed)
Patient is currently on potassium supplements twice a day. Check potassium level.

## 2013-05-12 NOTE — Assessment & Plan Note (Signed)
Ongoing fatigue. Suspect this is multifactorial given patient's recent complicated hospitalization for extended period of time. Last CBC showed improvement in hemoglobin. I will check her iron and ferritin today

## 2013-05-12 NOTE — Assessment & Plan Note (Signed)
Patient is doing well on fentanyl 25 mcg every 72 hours. Continue to follow.

## 2013-05-13 ENCOUNTER — Telehealth: Payer: Self-pay | Admitting: *Deleted

## 2013-05-13 ENCOUNTER — Encounter: Payer: Self-pay | Admitting: Adult Health

## 2013-05-13 LAB — POTASSIUM: Potassium: 4.6 mEq/L (ref 3.5–5.1)

## 2013-05-13 LAB — IRON: Iron: 69 ug/dL (ref 42–145)

## 2013-05-13 LAB — FERRITIN: Ferritin: 195.4 ng/mL (ref 10.0–291.0)

## 2013-05-13 NOTE — Telephone Encounter (Signed)
I would recommend that we just monitor for now. I reviewed Debra Saunders's note from yesterday and she was doing well at that point, no indication of volume overload. If any shortness of breath, chest pain, then needs to be seen. She should monitor weight daily and have follow up in 1-2 weeks.

## 2013-05-13 NOTE — Telephone Encounter (Signed)
Left message to call back  

## 2013-05-13 NOTE — Telephone Encounter (Signed)
Spoke with [name], informed her of Dr. Thomes Dinning instructions

## 2013-05-13 NOTE — Telephone Encounter (Signed)
Came in yesterday with a  4 pound weight gain and when she went out to see here this morning, there was another 2 lb weight gain. She is not short of breath and no cough but her abdomen does look swollen. She has not started back on her Carvedilol yet but will pick it up today.

## 2013-05-16 ENCOUNTER — Ambulatory Visit: Payer: Medicare Other | Admitting: Internal Medicine

## 2013-05-18 ENCOUNTER — Telehealth: Payer: Self-pay | Admitting: *Deleted

## 2013-05-18 NOTE — Telephone Encounter (Signed)
Just an FYI .[name]..Santiago Glad OT with [employer] left a voicemail stating there will be a delay in getting her evaluated for OT. They are still waiting on authorization from the insurance company. The patient is aware it will be some time next week before someone will get out there.

## 2013-05-20 ENCOUNTER — Telehealth: Payer: Self-pay

## 2013-05-20 NOTE — Telephone Encounter (Signed)
Zaelee advised, repeated message back and states an understanding

## 2013-05-20 NOTE — Telephone Encounter (Signed)
She has lasix 20 mg daily. May give extra dose as needed daily for weight gain > 5 lb in one week or 3 lb overnight. If continues, needs appt with Dr. Gilford Rile

## 2013-05-20 NOTE — Telephone Encounter (Signed)
Debra Saunders with [facility] care called stating pt has gained 7 lbs since Monday or Tuesday can patient have prn order to increase lasix or should patient be seen? [phone]

## 2013-05-27 ENCOUNTER — Telehealth: Payer: Self-pay | Admitting: *Deleted

## 2013-05-27 NOTE — Telephone Encounter (Signed)
Santiago Glad, the OT from [employer] left a message stating she saw patient today. Her plan of care is to see her 2 times a week for 3 weeks beginning next week to work on her endurance and strength. If no return call then she will assume that her POC is ok .

## 2013-06-10 ENCOUNTER — Encounter: Payer: Self-pay | Admitting: Emergency Medicine

## 2013-06-10 ENCOUNTER — Ambulatory Visit (INDEPENDENT_AMBULATORY_CARE_PROVIDER_SITE_OTHER): Payer: Medicare Other | Admitting: Internal Medicine

## 2013-06-10 VITALS — BP 130/90 | HR 55 | Temp 97.8°F | Wt 214.0 lb

## 2013-06-10 DIAGNOSIS — G8929 Other chronic pain: Secondary | ICD-10-CM

## 2013-06-10 DIAGNOSIS — L609 Nail disorder, unspecified: Secondary | ICD-10-CM

## 2013-06-10 DIAGNOSIS — I10 Essential (primary) hypertension: Secondary | ICD-10-CM

## 2013-06-10 DIAGNOSIS — J0110 Acute frontal sinusitis, unspecified: Secondary | ICD-10-CM

## 2013-06-10 DIAGNOSIS — E119 Type 2 diabetes mellitus without complications: Secondary | ICD-10-CM

## 2013-06-10 DIAGNOSIS — D649 Anemia, unspecified: Secondary | ICD-10-CM

## 2013-06-10 DIAGNOSIS — L608 Other nail disorders: Secondary | ICD-10-CM

## 2013-06-10 DIAGNOSIS — Z23 Encounter for immunization: Secondary | ICD-10-CM

## 2013-06-10 LAB — LIPID PANEL
Cholesterol: 174 mg/dL (ref 0–200)
HDL: 50.6 mg/dL (ref >39.00)
LDL Cholesterol: 90 mg/dL (ref 0–99)
Total CHOL/HDL Ratio: 3
Triglycerides: 166 mg/dL — ABNORMAL HIGH (ref 0.0–149.0)
VLDL: 33.2 mg/dL (ref 0.0–40.0)

## 2013-06-10 LAB — CBC WITH DIFFERENTIAL/PLATELET
Basophils Absolute: 0 10*3/uL (ref 0.0–0.1)
Basophils Relative: 0.5 % (ref 0.0–3.0)
Eosinophils Absolute: 0.2 10*3/uL (ref 0.0–0.7)
Eosinophils Relative: 3.5 % (ref 0.0–5.0)
HCT: 34.2 % — ABNORMAL LOW (ref 36.0–46.0)
Hemoglobin: 11 g/dL — ABNORMAL LOW (ref 12.0–15.0)
Lymphocytes Relative: 30.6 % (ref 12.0–46.0)
Lymphs Abs: 1.7 10*3/uL (ref 0.7–4.0)
MCHC: 32 g/dL (ref 30.0–36.0)
MCV: 81.2 fl (ref 78.0–100.0)
Monocytes Absolute: 0.5 10*3/uL (ref 0.1–1.0)
Monocytes Relative: 9.1 % (ref 3.0–12.0)
Neutro Abs: 3.1 10*3/uL (ref 1.4–7.7)
Neutrophils Relative %: 56.3 % (ref 43.0–77.0)
Platelets: 235 10*3/uL (ref 150.0–400.0)
RBC: 4.22 Mil/uL (ref 3.87–5.11)
RDW: 14.7 % — ABNORMAL HIGH (ref 11.5–14.6)
WBC: 5.5 10*3/uL (ref 4.5–10.5)

## 2013-06-10 LAB — HEMOGLOBIN A1C: Hgb A1c MFr Bld: 6.6 % — ABNORMAL HIGH (ref 4.6–6.5)

## 2013-06-10 LAB — COMPREHENSIVE METABOLIC PANEL
ALT: 20 U/L (ref 0–35)
AST: 21 U/L (ref 0–37)
Albumin: 3.8 g/dL (ref 3.5–5.2)
Alkaline Phosphatase: 102 U/L (ref 39–117)
BUN: 15 mg/dL (ref 6–23)
CO2: 32 mEq/L (ref 19–32)
Calcium: 9.5 mg/dL (ref 8.4–10.5)
Chloride: 100 mEq/L (ref 96–112)
Creatinine, Ser: 0.7 mg/dL (ref 0.4–1.2)
GFR: 110.03 mL/min (ref >60.00)
Glucose, Bld: 117 mg/dL — ABNORMAL HIGH (ref 70–99)
Potassium: 3.5 mEq/L (ref 3.5–5.1)
Sodium: 140 mEq/L (ref 135–145)
Total Bilirubin: 0.5 mg/dL (ref 0.3–1.2)
Total Protein: 7.9 g/dL (ref 6.0–8.3)

## 2013-06-10 LAB — FERRITIN: Ferritin: 188.2 ng/mL (ref 10.0–291.0)

## 2013-06-10 MED ORDER — POTASSIUM CHLORIDE CRYS ER 20 MEQ PO TBCR: 20.0000 meq | EXTENDED_RELEASE_TABLET | Freq: Two times a day (BID) | ORAL | Status: DC

## 2013-06-10 MED ORDER — FENTANYL 12 MCG/HR TD PT72: 12.0000 ug | MEDICATED_PATCH | TRANSDERMAL | Status: DC

## 2013-06-10 MED ORDER — LEVOFLOXACIN 750 MG PO TABS: 750.0000 mg | ORAL_TABLET | Freq: Every day | ORAL | Status: AC

## 2013-06-10 NOTE — Progress Notes (Signed)
Pre-visit discussion using our clinic review tool. No additional management support is needed unless otherwise documented below in the visit note.  

## 2013-06-11 DIAGNOSIS — G8929 Other chronic pain: Secondary | ICD-10-CM | POA: Insufficient documentation

## 2013-06-11 DIAGNOSIS — L608 Other nail disorders: Secondary | ICD-10-CM | POA: Insufficient documentation

## 2013-06-11 DIAGNOSIS — J0110 Acute frontal sinusitis, unspecified: Secondary | ICD-10-CM | POA: Insufficient documentation

## 2013-06-11 NOTE — Progress Notes (Signed)
Subjective:    Patient ID: Debra Saunders, female    DOB: [birth year], 67 y.o.   MRN: [id number]  HPI 67YO female with history of CVA, subarachnoid hemorrhage, seizures, PE with cardiac arrest, DVT, presents for follow up. She is concerned today about two-week history of increased nasal congestion and headache. Headache is described as aching on the top of her head. She was recently seen by neurosurgery and followup for her subarachnoid hemorrhage and reports that things were stable. She is scheduled for an EEG next week. She has been taking Tylenol for headache with no improvement.  She also continues to have aching in her anterior chest wall at the site of previous CPR. During her hospitalization, she had been placed on a fentanyl patch for this. We have taper down the dose over the last several months. She is currently using a 12 mcg per hour patch with control of her symptoms. She denies any change in chronic shortness of breath, or any palpitations.  In regards to her history of diabetes, she reports blood sugars have been well-controlled. She did not bring record of blood sugars with her today.  She is also concerned about both of her great toenails. She has some thickening in her toenails and occasionally this creates pain on the side of the nail. She denies any redness or drainage at the site. She does have chronic swelling in her feet and ankles. No recent fever or chills.   Outpatient Encounter Prescriptions as of 06/10/2013  Medication Sig  . acetaminophen (TYLENOL) 325 MG tablet Take 325 mg by mouth every 6 (six) hours as needed for pain.  [name] amLODipine (NORVASC) 5 MG tablet Take 1 tablet (5 mg total) by mouth daily.  . carvedilol (COREG) 3.125 MG tablet Take 1 tablet (3.125 mg total) by mouth 2 (two) times daily with a meal.  . chlorthalidone (HYGROTON) 25 MG tablet Take 0.5 tablets (12.5 mg total) by mouth daily.  . fentaNYL (DURAGESIC - DOSED MCG/HR) 12 MCG/HR Place 1 patch (12.5  mcg total) onto the skin every 3 (three) days.  . furosemide (LASIX) 20 MG tablet Take 1 tablet (20 mg total) by mouth daily.  [name] HYDROmorphone (DILAUDID) 2 MG tablet Take 2 mg by mouth every 4 (four) hours as needed for pain.  [name] levETIRAcetam (KEPPRA) 500 MG tablet Take 1 tablet (500 mg total) by mouth 2 (two) times daily.  . meclizine (ANTIVERT) 50 MG tablet Take 1 tablet (50 mg total) by mouth 2 (two) times daily as needed for dizziness.  . NON FORMULARY Oxygen 2 liters.  . NON FORMULARY CPAP  . polyethylene glycol powder (GLYCOLAX/MIRALAX) powder Take once daily as needed for constipation  . potassium chloride SA (K-DUR,KLOR-CON) 20 MEQ tablet Take 1 tablet (20 mEq total) by mouth 2 (two) times daily.  . promethazine (PHENERGAN) 25 MG tablet Take 25 mg by mouth every 4 (four) hours as needed for nausea.  . Simethicone (GAS-X PO) Take by mouth.  . metaxalone (SKELAXIN) 800 MG tablet Take 800 mg by mouth 3 (three) times daily.  . pantoprazole (PROTONIX) 40 MG tablet Take 1 tablet (40 mg total) by mouth daily.   BP 130/90  Pulse 55  Temp(Src) 97.8 F (36.6 C) (Oral)  Wt 214 lb (97.07 kg)  SpO2 97%  Review of Systems  Constitutional: Positive for fatigue. Negative for fever, chills, appetite change and unexpected weight change.  HENT: Positive for congestion and sinus pressure. Negative for ear pain, sore throat, trouble swallowing  and voice change.   Eyes: Negative for visual disturbance.  Respiratory: Negative for cough, shortness of breath, wheezing and stridor.   Cardiovascular: Negative for chest pain, palpitations and leg swelling.  Gastrointestinal: Negative for nausea, vomiting, abdominal pain, diarrhea, constipation, blood in stool, abdominal distention and anal bleeding.  Genitourinary: Negative for dysuria and flank pain.  Musculoskeletal: Negative for arthralgias, gait problem, myalgias and neck pain.  Skin: Negative for color change and rash.  Neurological: Positive for  headaches. Negative for dizziness.  Hematological: Negative for adenopathy. Does not bruise/bleed easily.  Psychiatric/Behavioral: Negative for suicidal ideas, sleep disturbance and dysphoric mood. The patient is not nervous/anxious.        Objective:   Physical Exam  Constitutional: She is oriented to person, place, and time. She appears well-developed and well-nourished. No distress.  HENT:  Head: Normocephalic and atraumatic.  Right Ear: External ear normal. A middle ear effusion is present.  Left Ear: External ear normal. A middle ear effusion is present.  Nose: Mucosal edema present.  Mouth/Throat: Oropharynx is clear and moist. No oropharyngeal exudate.  Eyes: Conjunctivae are normal. Pupils are equal, round, and reactive to light. Right eye exhibits no discharge. Left eye exhibits no discharge. No scleral icterus.  Neck: Normal range of motion. Neck supple. No tracheal deviation present. No thyromegaly present.  Cardiovascular: Normal rate, regular rhythm, normal heart sounds and intact distal pulses.  Exam reveals no gallop and no friction rub.   No murmur heard. Pulmonary/Chest: Effort normal and breath sounds normal. No accessory muscle usage. Not tachypneic. No respiratory distress. She has no decreased breath sounds. She has no wheezes. She has no rhonchi. She has no rales. She exhibits no tenderness.  Musculoskeletal: Normal range of motion. She exhibits edema (non-pitting bilateral LE). She exhibits no tenderness.  Lymphadenopathy:    She has no cervical adenopathy.  Neurological: She is alert and oriented to person, place, and time. No cranial nerve deficit. She exhibits normal muscle tone. Coordination normal.  Skin: Skin is warm and dry. No rash noted. She is not diaphoretic. No erythema. No pallor.  Psychiatric: She has a normal mood and affect. Her behavior is normal. Judgment and thought content normal.          Assessment & Plan:

## 2013-06-11 NOTE — Assessment & Plan Note (Signed)
BP Readings from Last 3 Encounters:  06/10/13 130/90  05/12/13 126/84  04/27/13 120/70   BP generally well controlled on current medications. Will continue.

## 2013-06-11 NOTE — Telephone Encounter (Signed)
Can you please call and check in with pt on Monday? I want to be sure her symptoms of headache are improving with treatment of her sinus infection. Thanks

## 2013-06-11 NOTE — Assessment & Plan Note (Signed)
Recheck CBC with labs today. 

## 2013-06-11 NOTE — Assessment & Plan Note (Signed)
Symptoms and exam consistent with sinusitis. Will start Levaquin. Pt will continue Tylenol as needed for pain. Pt will call if no improvement over the next 72hr.

## 2013-06-11 NOTE — Assessment & Plan Note (Signed)
Thickened bilateral great toenail, likely from fungal infection. Pt not a good candidate for oral anti-fungal agents. Will set up podiatry referral for evaluation.

## 2013-06-11 NOTE — Assessment & Plan Note (Signed)
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 Tolerated decrease in dose of fentanyl patch well. Will continue Fentanyl 73mcg/hr. Pt will call or return to clinic if any worsening symptoms.

## 2013-06-11 NOTE — Assessment & Plan Note (Signed)
Excellent control of BG with diet alone. Will continue to monitor.

## 2013-06-17 ENCOUNTER — Other Ambulatory Visit: Payer: Self-pay | Admitting: Internal Medicine

## 2013-06-17 MED ORDER — LEVETIRACETAM 500 MG PO TABS: 500.0000 mg | ORAL_TABLET | Freq: Two times a day (BID) | ORAL | Status: DC

## 2013-06-17 NOTE — Telephone Encounter (Signed)
Prescription sent to the pharmacy.

## 2013-06-22 ENCOUNTER — Other Ambulatory Visit: Payer: Self-pay

## 2013-06-23 HISTORY — PX: PACEMAKER INSERTION: SHX728

## 2013-06-30 ENCOUNTER — Other Ambulatory Visit: Payer: Self-pay | Admitting: *Deleted

## 2013-06-30 MED ORDER — FUROSEMIDE 20 MG PO TABS: 20.0000 mg | ORAL_TABLET | Freq: Two times a day (BID) | ORAL | Status: DC

## 2013-06-30 NOTE — Telephone Encounter (Signed)
Requested Prescriptions   Signed Prescriptions Disp Refills  . furosemide (LASIX) 20 MG tablet 60 tablet 0    Sig: Take 1 tablet (20 mg total) by mouth 2 (two) times daily.    Authorizing Provider: [name] A    Ordering User: Britt Bottom

## 2013-07-07 ENCOUNTER — Ambulatory Visit: Payer: Medicare Other | Admitting: Podiatrist

## 2013-07-19 ENCOUNTER — Encounter: Payer: Self-pay | Admitting: Cardiovascular Disease

## 2013-07-19 ENCOUNTER — Ambulatory Visit (INDEPENDENT_AMBULATORY_CARE_PROVIDER_SITE_OTHER): Payer: Medicare Other | Admitting: Cardiovascular Disease

## 2013-07-19 VITALS — BP 149/74 | HR 58 | Ht 61.0 in | Wt 215.2 lb

## 2013-07-19 DIAGNOSIS — I498 Other specified cardiac arrhythmias: Secondary | ICD-10-CM

## 2013-07-19 DIAGNOSIS — R079 Chest pain, unspecified: Secondary | ICD-10-CM

## 2013-07-19 DIAGNOSIS — I5032 Chronic diastolic (congestive) heart failure: Secondary | ICD-10-CM

## 2013-07-19 DIAGNOSIS — I10 Essential (primary) hypertension: Secondary | ICD-10-CM

## 2013-07-19 DIAGNOSIS — R001 Bradycardia, unspecified: Secondary | ICD-10-CM

## 2013-07-19 NOTE — Assessment & Plan Note (Signed)
Blood pressure is reasonably controlled. Carvedilol was discontinued due to bradycardia.

## 2013-07-19 NOTE — Assessment & Plan Note (Signed)
She appears to be euvolemic on current dose of Lasix. She is also on chlorthalidone for hypertension.

## 2013-07-19 NOTE — Progress Notes (Signed)
HPI  This is a 68 -year-old female who is here today for a followup visit.  She has known history of chronic diastolic heart failure with pulmonary hypertension and bradycardia while on carvedilol. She has chronic left lower extremity swelling which has been evaluated in the past with venous duplex ultrasound on multiple occasions and was negative for DVT. She likely had lymphedema on the left side. Her echocardiogram in 2013 showed normal LV systolic function with mild diastolic dysfunction and evidence of elevated filling pressures. There was also moderate pulmonary hypertension with peak systolic pressure of 56 mm Hg. she underwent cardiac catheterization in August of 2013 which showed no evidence of obstructive coronary artery disease. She was noted to be severely hypertensive with significant elevation in left ventricular end-diastolic pressure.  She was hospitalized in July at [facility] for severe chest pain. Cardiac workup was overall negative. CT of the chest showed possible peripheral pulmonary emboli which really did not explain her symptoms. She was anticoagulated which was complicated by subarachnoid hemorrhage and cardiopulmonary arrest. She was transferred to [facility] and was treated conservatively. Anticoagulation was discontinued. Chest pain was felt to be due to esophagitis. She was discharged home but readmitted in August at [facility] with septic shock and acute renal failure requiring brief dialysis. She ultimately improved. Renal function went back to normal. She has been improving slowly. She still feels weak and walks with a walker. Heart rate has been dropping lately with one reading of 43 beats per minute during the day. She feels slightly dizzy.  Allergies  Allergen Reactions  . Other     Blood Thinners-per doctor at [facility]  . Penicillins Hives and Swelling     Current Outpatient Prescriptions on File Prior to Visit  Medication Sig Dispense Refill  . acetaminophen (TYLENOL) 325 MG  tablet Take 325 mg by mouth every 6 (six) hours as needed for pain.      [name] amLODipine (NORVASC) 5 MG tablet Take 1 tablet (5 mg total) by mouth daily.  30 tablet  6  . carvedilol (COREG) 3.125 MG tablet Take 1 tablet (3.125 mg total) by mouth 2 (two) times daily with a meal.  60 tablet  5  . chlorthalidone (HYGROTON) 25 MG tablet Take 0.5 tablets (12.5 mg total) by mouth daily.  30 tablet  5  . furosemide (LASIX) 20 MG tablet Take 1 tablet (20 mg total) by mouth 2 (two) times daily.  60 tablet  0  . HYDROmorphone (DILAUDID) 2 MG tablet Take 2 mg by mouth every 4 (four) hours as needed for pain.      [name] levETIRAcetam (KEPPRA) 500 MG tablet Take 1 tablet (500 mg total) by mouth 2 (two) times daily.  60 tablet  0  . meclizine (ANTIVERT) 50 MG tablet Take 1 tablet (50 mg total) by mouth 2 (two) times daily as needed for dizziness.  30 tablet  0  . metaxalone (SKELAXIN) 800 MG tablet Take 800 mg by mouth 3 (three) times daily.      . NON FORMULARY Oxygen 2.5 liters.      . NON FORMULARY CPAP      . polyethylene glycol powder (GLYCOLAX/MIRALAX) powder Take once daily as needed for constipation  3350 g  1  . potassium chloride SA (K-DUR,KLOR-CON) 20 MEQ tablet Take 1 tablet (20 mEq total) by mouth 2 (two) times daily.  60 tablet  2  . promethazine (PHENERGAN) 25 MG tablet Take 25 mg by mouth every 4 (four)  hours as needed for nausea.      . Simethicone (GAS-X PO) Take by mouth as needed.        No current facility-administered medications on file prior to visit.     Past Medical History  Diagnosis Date  . Thyroid disorder   . Diabetes mellitus     type II  . Thyroid nodule   . Sleep apnea   . Obesity   . Hypertension   . Hyperlipidemia   . Urinary incontinence   . CHF (congestive heart failure) 2005    Dx at [facility]  . Chronic back pain   . COPD (chronic obstructive pulmonary disease)     2L [place]  . Sleep apnea   . Lipoma of back   . Arthritis   . Streptococcal bacteremia   . Acute renal  failure   . Stroke   . Sepsis with metabolic encephalopathy   . Gastritis      Past Surgical History  Procedure Laterality Date  . Cholecystectomy    . Vaginal hysterectomy    . Cardiac catheterization  2013    @ [place]: No obstructive CAD: Only 20% ostial left circumflex.  . Abdominal hysterectomy  1969  . Cyst removal trunk  2002    BACK  . Lipoma removal  2014    back     Family History  Problem Relation Age of Onset  . Heart disease Mother   . Hypertension Mother   . COPD Mother     was a smoker  . Thyroid disease Mother   . Hypertension Father   . Hypertension Sister   . Thyroid disease Sister   . Hypertension Sister   . Thyroid disease Sister   . Breast cancer Maternal Aunt      History   Social History  . Marital Status: Single    Spouse Name: N/A    Number of Children: N/A  . Years of Education: N/A   Occupational History  . Retired- factory work     exposed to [place]  . Smoking status: Former Smoker -- 0.30 packs/day for 2 years    Types: Cigarettes    Quit date: 06/23/1990  . Smokeless tobacco: Not on file  . Alcohol Use: No  . Drug Use: No  . Sexual Activity: Not on file   Other Topics Concern  . Not on file   Social History Narrative   Lives in [place] with daughter. Has 4 children. No pets.      Work - Restaurant manager, fast food, retired      Diet - regular       PHYSICAL EXAM   BP 149/74  Pulse 58  Ht 5\' 1"  (1.549 m)  Wt 215 lb 4 oz (97.637 kg)  BMI 40.69 kg/m2 Constitutional: She is oriented to person, place, and time. She appears well-developed and well-nourished. No distress.  HENT: No nasal discharge.  Head: Normocephalic and atraumatic.  Eyes: Pupils are equal and round. Right eye exhibits no discharge. Left eye exhibits no discharge.  Neck: Normal range of motion. Neck supple. No JVD present. No thyromegaly present.  Cardiovascular: Normal rate, regular rhythm, normal heart sounds. Exam  reveals no gallop and no friction rub. No murmur heard.  Pulmonary/Chest: Effort normal and breath sounds normal. No stridor. No respiratory distress. She has no wheezes. She has no rales. She exhibits no tenderness.  Abdominal: Soft. Bowel sounds are normal. She exhibits no distension. There is  no tenderness. There is no rebound and no guarding.  Musculoskeletal: Normal range of motion. She exhibits chronic left lower extremity edema and no tenderness.  Neurological: She is alert and oriented to person, place, and time. Coordination normal.  Skin: Skin is warm and dry. No rash noted. She is not diaphoretic. No erythema. No pallor.  Psychiatric: She has a normal mood and affect. Her behavior is normal. Judgment and thought content normal.     EKG: Sinus bradycardia with sinus arrhythmia, left ventricular hypertrophy with nonspecific ST changes.   ASSESSMENT AND PLAN

## 2013-07-19 NOTE — Patient Instructions (Signed)
Your physician has recommended you make the following change in your medication:  Coreg   Continue other meds   Your physician recommends that you schedule a follow-up appointment in:  3 months

## 2013-07-19 NOTE — Assessment & Plan Note (Signed)
I stop carvedilol today. Continue observation.

## 2013-07-28 ENCOUNTER — Encounter: Payer: Self-pay | Admitting: Pulmonary Disease

## 2013-07-28 ENCOUNTER — Ambulatory Visit (INDEPENDENT_AMBULATORY_CARE_PROVIDER_SITE_OTHER): Payer: Medicare Other | Admitting: Pulmonary Disease

## 2013-07-28 VITALS — BP 140/72 | HR 71 | Ht 62.0 in | Wt 216.0 lb

## 2013-07-28 DIAGNOSIS — R609 Edema, unspecified: Secondary | ICD-10-CM

## 2013-07-28 DIAGNOSIS — I272 Pulmonary hypertension, unspecified: Secondary | ICD-10-CM

## 2013-07-28 DIAGNOSIS — R5383 Other fatigue: Secondary | ICD-10-CM

## 2013-07-28 DIAGNOSIS — G4733 Obstructive sleep apnea (adult) (pediatric): Secondary | ICD-10-CM

## 2013-07-28 DIAGNOSIS — I2789 Other specified pulmonary heart diseases: Secondary | ICD-10-CM

## 2013-07-28 DIAGNOSIS — R5381 Other malaise: Secondary | ICD-10-CM

## 2013-07-28 DIAGNOSIS — R0602 Shortness of breath: Secondary | ICD-10-CM

## 2013-07-28 DIAGNOSIS — R600 Localized edema: Secondary | ICD-10-CM

## 2013-07-28 NOTE — Assessment & Plan Note (Signed)
Debra Saunders has had a remarkable year. Since I last saw her she had a cardiac arrest in the setting of a pulmonary embolism which led to anticoagulation which led to a subarachnoid hemorrhage. She then had septic shock with acute renal failure requiring hemodialysis from which she has not recovered.  At this point, I don't think that she has decompensated heart failure because her body weight is actually down compared to before. She complains of fatigue but I do not think this is do to pulmonary hypertension as one would expect more overall edema and hypoxemia.  Plan: -Continue CPAP  -continue oxygen -Obtain records of recent echocardiogram

## 2013-07-28 NOTE — Assessment & Plan Note (Signed)
This is due to her DVT. The IVC filter is likely complicating things. Unfortunately, she cannot be anticoagulated. Today though her leg was swollen she did not have phlegmasia cerulea dolens on exam. I explained to her that she may want to talk to her vascular surgeon about the possibility of a thrombectomy in 2015.

## 2013-07-28 NOTE — Assessment & Plan Note (Signed)
At this point her exam reveals no specific findings but clearly she has been through a lot in the last year or so some fatigue and mild dyspnea is expected. It has been a while since she's had labs checked an x-ray so we need to get those to make sure there is nothing else going on.  Plan: -Obtain CBC, cemented and chest x-ray -Gradually increase activity -May want to start pulmonary rehabilitation again in 2015

## 2013-07-28 NOTE — Assessment & Plan Note (Signed)
Continue CPAP Obtain overnight oximetry test.

## 2013-07-28 NOTE — Progress Notes (Signed)
Subjective:    Patient ID: Debra Saunders, female    DOB: 1945/08/24, 68 y.o.   MRN: [id number]  Synopsis: Debra Saunders is a very pleasant female with obstructive sleep apnea, obesity, diastolic dysfunction, and pulmonary hypertension who is referred to the [facility] pulmonary clinic in November 2013 for evaluation of pulmonary hypertension. In 2005 she established care with Dr. Thom Chimes at [facility] for evaluation of the same. Dr. Gaynell Face felt that her pulmonary hypertension was do to her congestive heart failure, obesity, and obstructive sleep apnea. Unfortunately she lost her insurance around 2008 and did not have regular pulmonary visits. During the interval from 2008 2013 her shortness of breath increased and she gained about 50 pounds. She had a left heart catheterization in 2013 which showed very high filling pressures of the left ventricle as well as high systemic pressures but normal coronary arteries. In November 2013 her simple spirometry was completely normal.  HPI   07/13/2012 ROV>> Debra Saunders says she thinks she is doing maybe a little better since her last visit. She has increased energy compared to before, particularly on the days when she has gone to pulmonary rehabilitation. She says that this is really making a big difference. She is using her CPAP machine on 13 cm of water after her CPAP titration study. She states that she still has intermittent shortness of breath when going out into cold weather. Also if she goes from the cold into the warm weather this will sometimes make it feel like she can't get enough air in. She also feels that when she is exposed to cigarette smoke it will make her more short of breath. In the past, she has used multiple inhalers and never received any benefit from them.  11/09/2012 ROV >> Debra Saunders has been doing very well since the last visit and states that her breathing is doing quite well. She is very pleased with herself  after completing pulmonary rehabilitation. She feels that her shortness of breath has improved and her activity level has improved as well. She states that she has good days and bad days but in general her shortness of breath is reasonable. She still has intermittent chest tightness and states that sometimes she'll get relief with the use of a short acting bronchodilator. She tried using the Qvar but stated that it didn't really help much. She has been following her sodium intake carefully and with the use of leg pumps at home she has seen quite a bit of difference in her leg swelling. She has been using her CPAP machine every night. She continues to use and benefit from oxygen.  07/28/2013 ROV > She has had a lengthy hospital sta with SAH, PE, AKI requiring HD, shock, VDRF; dvt > ivc filter.  She has been feeling more winded and weak for the last 2-3 weeks with a lot of fatigue. Just fixing a sadnwich makes her short of breath.  PT has ended.  She has been walking regularly but no leg movements.  She has noticed more swelling in her legs during this time.  Her weight wsa way down in the hospital, now up about 13 lbs from before.  She is under weight monitoring closely and she has had her lasix increase her lasix recently.  She has been on two a day for a few months.  She has been told to back off on her fluid intake.  She drinks over a gallon of water a day.  She is trying  to watch her sodium intake but not too strictly. She is not wheezing or coughing much.  She has more shortness of breath at night when lying down.  Past Medical History  Diagnosis Date  . Thyroid disorder   . Diabetes mellitus     type II  . Thyroid nodule   . Sleep apnea   . Obesity   . Hypertension   . Hyperlipidemia   . Urinary incontinence   . CHF (congestive heart failure) 2005    Dx at [facility]  . Chronic back pain   . COPD (chronic obstructive pulmonary disease)     2L [place]  . Sleep apnea   . Lipoma of back   . Arthritis    . Streptococcal bacteremia   . Acute renal failure   . Stroke   . Sepsis with metabolic encephalopathy   . Gastritis      Review of Systems  Constitutional: Negative for fever, chills, activity change and fatigue.  HENT: Negative for congestion, postnasal drip and rhinorrhea.   Respiratory: Positive for shortness of breath. Negative for cough, chest tightness and wheezing.   Cardiovascular: Positive for leg swelling. Negative for chest pain and palpitations.       Objective:   Physical Exam  Filed Vitals:   07/28/13 1538  BP: 140/72  Pulse: 71  Height: 5\' 2"  (1.575 m)  Weight: 216 lb (97.977 kg)  SpO2: 98%  2.5L [place]  Gen: chronically ill appearing but in no acute distress HEENT: NCAT, PERRL, EOMi,  PULM: CTA B CV: RRR, no mgr, no JVD AB: BS+, soft, nontender, no hsm Ext: warm, massive edema left leg, non-tender, no redness or warmth, no clubbing, no cyanosis  2004 [facility]: right heart catheterization from 10/25/02 revealed a PA pressure of 62/25 (mean 36) and a mean PA pressure decreased remarkably to 20 during this catheterization. A PCWP was normal at 6 and a cardiac index of 2.42 L/m/m2 with an initial increase in vasodilator therapy to 2.71 L/m/m2 2004 6MW 313 meters (1,027 feet) On oxygen in 2004 04/22/2012 6 minute walk>> total distance walked 950 feet, heart rate ranged between 56 and 88, O2 saturation ranged between 91 and 98% on 2 L nasal cannula 11-26 Spirometry >> no obstruction, FEV1 1.42 L (70%) 11-26 6MW >> 795 Ft, O2 saturation 90% lowest on 2L 07/2012 6MW >> 1115 feet, O2 saturation 97% 2LNC     Assessment & Plan:   Pulmonary hypertension Debra Saunders has had a remarkable year. Since I last saw her she had a cardiac arrest in the setting of a pulmonary embolism which led to anticoagulation which led to a subarachnoid hemorrhage. She then had septic shock with acute renal failure requiring hemodialysis from which she has not recovered.  At this point, I don't  think that she has decompensated heart failure because her body weight is actually down compared to before. She complains of fatigue but I do not think this is do to pulmonary hypertension as one would expect more overall edema and hypoxemia.  Plan: -Continue CPAP  -continue oxygen -Obtain records of recent echocardiogram  Leg edema, left This is due to her DVT. The IVC filter is likely complicating things. Unfortunately, she cannot be anticoagulated. Today though her leg was swollen she did not have phlegmasia cerulea dolens on exam. I explained to her that she may want to talk to her vascular surgeon about the possibility of a thrombectomy in 2015.  Fatigue At this point her exam reveals no specific  findings but clearly she has been through a lot in the last year or so some fatigue and mild dyspnea is expected. It has been a while since she's had labs checked an x-ray so we need to get those to make sure there is nothing else going on.  Plan: -Obtain CBC, cemented and chest x-ray -Gradually increase activity -May want to start pulmonary rehabilitation again in 2015  OSA (obstructive sleep apnea) Continue CPAP Obtain overnight oximetry test.    Updated Medication List Outpatient Encounter Prescriptions as of 07/28/2013  Medication Sig  . acetaminophen (TYLENOL) 325 MG tablet Take 325 mg by mouth every 6 (six) hours as needed for pain.  [name] amLODipine (NORVASC) 5 MG tablet Take 1 tablet (5 mg total) by mouth daily.  . chlorthalidone (HYGROTON) 25 MG tablet Take 0.5 tablets (12.5 mg total) by mouth daily.  . furosemide (LASIX) 20 MG tablet Take 1 tablet (20 mg total) by mouth 2 (two) times daily.  [name] HYDROmorphone (DILAUDID) 2 MG tablet Take 2 mg by mouth every 4 (four) hours as needed for pain.  [name] levETIRAcetam (KEPPRA) 500 MG tablet Take 1 tablet (500 mg total) by mouth 2 (two) times daily.  . meclizine (ANTIVERT) 50 MG tablet Take 1 tablet (50 mg total) by mouth 2 (two) times daily as  needed for dizziness.  . metaxalone (SKELAXIN) 800 MG tablet Take 800 mg by mouth 3 (three) times daily.  . NON FORMULARY Oxygen 2.5 liters.  . NON FORMULARY CPAP  . pantoprazole (PROTONIX) 40 MG tablet Take 40 mg by mouth as needed.  . polyethylene glycol powder (GLYCOLAX/MIRALAX) powder Take once daily as needed for constipation  . potassium chloride SA (K-DUR,KLOR-CON) 20 MEQ tablet Take 1 tablet (20 mEq total) by mouth 2 (two) times daily.  . promethazine (PHENERGAN) 25 MG tablet Take 25 mg by mouth every 4 (four) hours as needed for nausea.  . Simethicone (GAS-X PO) Take by mouth as needed.

## 2013-07-28 NOTE — Patient Instructions (Signed)
We will call you with the results of your blood work We will set up an oxygen test at night when you sleep Keep using your oxygen as you are doing Schedule an appointment with [facility] for your leg We will see you back in 3 months or sooner if needed

## 2013-07-29 LAB — COMPREHENSIVE METABOLIC PANEL
ALT: 23 U/L (ref 0–35)
AST: 26 U/L (ref 0–37)
Albumin: 3.8 g/dL (ref 3.5–5.2)
Alkaline Phosphatase: 114 U/L (ref 39–117)
BUN: 19 mg/dL (ref 6–23)
CO2: 32 mEq/L (ref 19–32)
Calcium: 9.6 mg/dL (ref 8.4–10.5)
Chloride: 99 mEq/L (ref 96–112)
Creatinine, Ser: 0.7 mg/dL (ref 0.4–1.2)
GFR: 113.84 mL/min (ref >60.00)
Glucose, Bld: 120 mg/dL — ABNORMAL HIGH (ref 70–99)
Potassium: 3.5 mEq/L (ref 3.5–5.1)
Sodium: 138 mEq/L (ref 135–145)
Total Bilirubin: 0.5 mg/dL (ref 0.3–1.2)
Total Protein: 7.8 g/dL (ref 6.0–8.3)

## 2013-07-29 LAB — CBC WITH DIFFERENTIAL/PLATELET
Basophils Absolute: 0 10*3/uL (ref 0.0–0.1)
Basophils Relative: 0.5 % (ref 0.0–3.0)
Eosinophils Absolute: 0.2 10*3/uL (ref 0.0–0.7)
Eosinophils Relative: 4.3 % (ref 0.0–5.0)
HCT: 37.2 % (ref 36.0–46.0)
Hemoglobin: 11.6 g/dL — ABNORMAL LOW (ref 12.0–15.0)
Lymphocytes Relative: 29.3 % (ref 12.0–46.0)
Lymphs Abs: 1.6 10*3/uL (ref 0.7–4.0)
MCHC: 31.3 g/dL (ref 30.0–36.0)
MCV: 85.4 fl (ref 78.0–100.0)
Monocytes Absolute: 0.4 10*3/uL (ref 0.1–1.0)
Monocytes Relative: 7.8 % (ref 3.0–12.0)
Neutro Abs: 3.2 10*3/uL (ref 1.4–7.7)
Neutrophils Relative %: 58.1 % (ref 43.0–77.0)
Platelets: 241 10*3/uL (ref 150.0–400.0)
RBC: 4.35 Mil/uL (ref 3.87–5.11)
RDW: 14 % (ref 11.5–14.6)
WBC: 5.4 10*3/uL (ref 4.5–10.5)

## 2013-07-29 LAB — BRAIN NATRIURETIC PEPTIDE: Pro B Natriuretic peptide (BNP): 28 pg/mL (ref 0.0–100.0)

## 2013-08-01 ENCOUNTER — Encounter: Payer: Self-pay | Admitting: Internal Medicine

## 2013-08-03 ENCOUNTER — Ambulatory Visit (INDEPENDENT_AMBULATORY_CARE_PROVIDER_SITE_OTHER): Payer: Medicare Other | Admitting: Internal Medicine

## 2013-08-03 ENCOUNTER — Encounter: Payer: Self-pay | Admitting: Internal Medicine

## 2013-08-03 ENCOUNTER — Other Ambulatory Visit: Payer: Self-pay | Admitting: *Deleted

## 2013-08-03 VITALS — BP 160/90 | HR 60 | Temp 98.1°F | Wt 214.0 lb

## 2013-08-03 DIAGNOSIS — I890 Lymphedema, not elsewhere classified: Secondary | ICD-10-CM | POA: Insufficient documentation

## 2013-08-03 DIAGNOSIS — Z23 Encounter for immunization: Secondary | ICD-10-CM

## 2013-08-03 DIAGNOSIS — I10 Essential (primary) hypertension: Secondary | ICD-10-CM

## 2013-08-03 DIAGNOSIS — S91309A Unspecified open wound, unspecified foot, initial encounter: Secondary | ICD-10-CM

## 2013-08-03 MED ORDER — POTASSIUM CHLORIDE CRYS ER 20 MEQ PO TBCR: 20.0000 meq | EXTENDED_RELEASE_TABLET | Freq: Two times a day (BID) | ORAL | Status: DC

## 2013-08-03 MED ORDER — FUROSEMIDE 20 MG PO TABS: 20.0000 mg | ORAL_TABLET | Freq: Two times a day (BID) | ORAL | Status: DC

## 2013-08-03 MED ORDER — AMLODIPINE BESYLATE 5 MG PO TABS: 5.0000 mg | ORAL_TABLET | Freq: Every day | ORAL | Status: DC

## 2013-08-03 NOTE — Addendum Note (Signed)
Addended by: Ronaldo Miyamoto on: 08/03/2013 04:54 PM   Modules accepted: Orders

## 2013-08-03 NOTE — Assessment & Plan Note (Signed)
Chronic lymphedema left LE with acute worsening, likely exacerbated by chronic venous insufficiency in setting of IVC filter. Will set up evaluation with wound center and PT for lymphedema.

## 2013-08-03 NOTE — Telephone Encounter (Signed)
Requested Prescriptions   Signed Prescriptions Disp Refills  . furosemide (LASIX) 20 MG tablet 60 tablet 3    Sig: Take 1 tablet (20 mg total) by mouth 2 (two) times daily.    Authorizing Provider: Minna Merritts    Ordering User: Britt Bottom

## 2013-08-03 NOTE — Progress Notes (Signed)
   Subjective:    Patient ID: Debra Saunders, female    DOB: [birth year], 68 y.o.   MRN: [id number]  HPI 68YO female presents for acute visit. Noticed open wound left leg last Thursday. Noticed drainage which has been clear. No pain. No trauma to foot. No fever, chills. Chronic left lower leg swelling unchanged. No erythema noted.  Review of Systems  Constitutional: Negative for fever, chills and fatigue.  Respiratory: Negative for shortness of breath.   Cardiovascular: Positive for leg swelling. Negative for chest pain.  Skin: Positive for color change and wound.       Objective:    BP 160/90  Pulse 60  Temp(Src) 98.1 F (36.7 C) (Oral)  Wt 214 lb (97.07 kg)  SpO2 97% Physical Exam  Constitutional: She is oriented to person, place, and time. She appears well-developed and well-nourished. No distress.  HENT:  Head: Normocephalic and atraumatic.  Right Ear: External ear normal.  Left Ear: External ear normal.  Nose: Nose normal.  Mouth/Throat: Oropharynx is clear and moist. No oropharyngeal exudate.  Eyes: Conjunctivae are normal. Pupils are equal, round, and reactive to light. Right eye exhibits no discharge. Left eye exhibits no discharge. No scleral icterus.  Neck: Normal range of motion. Neck supple. No tracheal deviation present. No thyromegaly present.  Cardiovascular: Normal rate, regular rhythm, normal heart sounds and intact distal pulses.  Exam reveals no gallop and no friction rub.   No murmur heard. Pulmonary/Chest: Effort normal and breath sounds normal. No accessory muscle usage. Not tachypneic. No respiratory distress. She has no decreased breath sounds. She has no wheezes. She has no rhonchi. She has no rales. She exhibits no tenderness.  Musculoskeletal: Normal range of motion. She exhibits edema (non pitting left lower extremity to knee). She exhibits no tenderness.  Lymphadenopathy:    She has no cervical adenopathy.  Neurological: She is alert and oriented to  person, place, and time. No cranial nerve deficit. She exhibits normal muscle tone. Coordination normal.  Skin: Skin is warm and dry. No rash noted. She is not diaphoretic. No erythema. No pallor.     Psychiatric: She has a normal mood and affect. Her behavior is normal. Judgment and thought content normal.          Assessment & Plan:   Problem List Items Addressed This Visit   Hypertension   Relevant Medications      amLODIpine (NORVASC) tablet   Lymphedema     Chronic lymphedema left LE with acute worsening, likely exacerbated by chronic venous insufficiency in setting of IVC filter. Will set up evaluation with wound center and PT for lymphedema.    Relevant Orders      Ambulatory referral to Physical Therapy   Open wound of foot - Primary     Open wound of the medial left ankle with clean edges. Exacerbated by chronic uncontrolled lymphedema. Will set up physical therapy for lymphedema management. Will set up evaluation the wound healing Center. Ankle was wrapped in gauze and Coban today. Encouraged patient to call if any worsening swelling, redness, fever, chills.        Return in about 2 weeks (around 08/17/2013) for Recheck Wound.

## 2013-08-03 NOTE — Progress Notes (Signed)
Pre-visit discussion using our clinic review tool. No additional management support is needed unless otherwise documented below in the visit note.  

## 2013-08-03 NOTE — Assessment & Plan Note (Signed)
Open wound of the medial left ankle with clean edges. Exacerbated by chronic uncontrolled lymphedema. Will set up physical therapy for lymphedema management. Will set up evaluation the wound healing Center. Ankle was wrapped in gauze and Coban today. Encouraged patient to call if any worsening swelling, redness, fever, chills.

## 2013-08-04 ENCOUNTER — Telehealth: Payer: Self-pay | Admitting: Internal Medicine

## 2013-08-04 NOTE — Telephone Encounter (Signed)
Relevant patient education assigned to patient using Emmi. ° °

## 2013-08-05 ENCOUNTER — Telehealth: Payer: Self-pay | Admitting: *Deleted

## 2013-08-05 ENCOUNTER — Other Ambulatory Visit: Payer: Self-pay | Admitting: *Deleted

## 2013-08-05 MED ORDER — LEVETIRACETAM 500 MG PO TABS: 500.0000 mg | ORAL_TABLET | Freq: Two times a day (BID) | ORAL | Status: DC

## 2013-08-05 NOTE — Telephone Encounter (Signed)
Patient left a message stating the open wound on her foot is better. Returned patient call she state after it was wrapped in office the other day it seem to have dried up, it looks better and there is nothing there. She slept with it unwrapped last night and when she woke up this morning it was still not oozing. The swelling has went down a little but it is still swollen. Also you can barely see the spot anymore, so would you like for her to still go to the wound clinic if though there is nothing there?

## 2013-08-05 NOTE — Telephone Encounter (Signed)
Ok refill? 

## 2013-08-05 NOTE — Telephone Encounter (Signed)
Patient has been informed to still go to wound clinic appointment

## 2013-08-05 NOTE — Telephone Encounter (Signed)
Yes, I would still like for her to go to the wound clinic.

## 2013-08-10 NOTE — Progress Notes (Signed)
atc X1, NA, WCB

## 2013-08-12 ENCOUNTER — Telehealth: Payer: Self-pay

## 2013-08-12 NOTE — Telephone Encounter (Signed)
Message copied by Len Blalock on Fri Aug 12, 2013  4:21 PM ------      Message from: Simonne Maffucci B      Created: Mon Aug 08, 2013  7:41 PM       A,            Please let her know this was normal            Thanks      B ------

## 2013-08-12 NOTE — Telephone Encounter (Signed)
Pt aware of lab results.  Nothing further needed. 

## 2013-08-16 ENCOUNTER — Observation Stay: Payer: Self-pay | Admitting: Internal Medicine

## 2013-08-16 LAB — COMPREHENSIVE METABOLIC PANEL
Albumin: 3.5 g/dL (ref 3.4–5.0)
Alkaline Phosphatase: 129 U/L — ABNORMAL HIGH
Anion Gap: 4 — ABNORMAL LOW (ref 7–16)
BUN: 21 mg/dL — ABNORMAL HIGH (ref 7–18)
Bilirubin,Total: 0.4 mg/dL (ref 0.2–1.0)
Calcium, Total: 9.1 mg/dL (ref 8.5–10.1)
Chloride: 101 mmol/L (ref 98–107)
Co2: 33 mmol/L — ABNORMAL HIGH (ref 21–32)
Creatinine: 0.74 mg/dL (ref 0.60–1.30)
EGFR (African American): >60
EGFR (Non-African Amer.): >60
Glucose: 119 mg/dL — ABNORMAL HIGH (ref 65–99)
Osmolality: 280 (ref 275–301)
Potassium: 3.8 mmol/L (ref 3.5–5.1)
SGOT(AST): 49 U/L — ABNORMAL HIGH (ref 15–37)
SGPT (ALT): 34 U/L (ref 12–78)
Sodium: 138 mmol/L (ref 136–145)
Total Protein: 8 g/dL (ref 6.4–8.2)

## 2013-08-16 LAB — CBC
HCT: 36.3 % (ref 35.0–47.0)
HGB: 11.3 g/dL — ABNORMAL LOW (ref 12.0–16.0)
MCH: 26.1 pg (ref 26.0–34.0)
MCHC: 31.2 g/dL — ABNORMAL LOW (ref 32.0–36.0)
MCV: 84 fL (ref 80–100)
Platelet: 219 10*3/uL (ref 150–440)
RBC: 4.34 10*6/uL (ref 3.80–5.20)
RDW: 13.3 % (ref 11.5–14.5)
WBC: 5.8 10*3/uL (ref 3.6–11.0)

## 2013-08-16 LAB — TROPONIN I
Troponin-I: <0.02 ng/mL
Troponin-I: <0.02 ng/mL

## 2013-08-16 LAB — PRO B NATRIURETIC PEPTIDE: B-Type Natriuretic Peptide: 59 pg/mL (ref 0–125)

## 2013-08-16 LAB — CK-MB: CK-MB: 0.5 ng/mL (ref 0.5–3.6)

## 2013-08-17 DIAGNOSIS — R079 Chest pain, unspecified: Secondary | ICD-10-CM

## 2013-08-17 DIAGNOSIS — I10 Essential (primary) hypertension: Secondary | ICD-10-CM

## 2013-08-17 DIAGNOSIS — I2789 Other specified pulmonary heart diseases: Secondary | ICD-10-CM

## 2013-08-17 LAB — BASIC METABOLIC PANEL
Anion Gap: 6 — ABNORMAL LOW (ref 7–16)
BUN: 16 mg/dL (ref 7–18)
Calcium, Total: 8.7 mg/dL (ref 8.5–10.1)
Chloride: 100 mmol/L (ref 98–107)
Co2: 32 mmol/L (ref 21–32)
Creatinine: 0.76 mg/dL (ref 0.60–1.30)
EGFR (African American): >60
EGFR (Non-African Amer.): >60
Glucose: 131 mg/dL — ABNORMAL HIGH (ref 65–99)
Osmolality: 279 (ref 275–301)
Potassium: 3.1 mmol/L — ABNORMAL LOW (ref 3.5–5.1)
Sodium: 138 mmol/L (ref 136–145)

## 2013-08-17 LAB — CBC WITH DIFFERENTIAL/PLATELET
Basophil #: 0.1 10*3/uL (ref 0.0–0.1)
Basophil %: 1.3 %
Eosinophil #: 0.2 10*3/uL (ref 0.0–0.7)
Eosinophil %: 4.3 %
HCT: 34.8 % — ABNORMAL LOW (ref 35.0–47.0)
HGB: 11.2 g/dL — ABNORMAL LOW (ref 12.0–16.0)
Lymphocyte #: 1.6 10*3/uL (ref 1.0–3.6)
Lymphocyte %: 31.7 %
MCH: 26.9 pg (ref 26.0–34.0)
MCHC: 32.1 g/dL (ref 32.0–36.0)
MCV: 84 fL (ref 80–100)
Monocyte #: 0.5 x10 3/mm (ref 0.2–0.9)
Monocyte %: 10.1 %
Neutrophil #: 2.7 10*3/uL (ref 1.4–6.5)
Neutrophil %: 52.6 %
Platelet: 198 10*3/uL (ref 150–440)
RBC: 4.14 10*6/uL (ref 3.80–5.20)
RDW: 13.5 % (ref 11.5–14.5)
WBC: 5.1 10*3/uL (ref 3.6–11.0)

## 2013-08-17 LAB — CK-MB
CK-MB: 0.7 ng/mL (ref 0.5–3.6)
CK-MB: 0.5 ng/mL (ref 0.5–3.6)

## 2013-08-17 LAB — TSH: Thyroid Stimulating Horm: 1.22 u[IU]/mL

## 2013-08-17 LAB — TROPONIN I: Troponin-I: <0.02 ng/mL

## 2013-08-22 ENCOUNTER — Telehealth: Payer: Self-pay | Admitting: Internal Medicine

## 2013-08-22 DIAGNOSIS — R001 Bradycardia, unspecified: Secondary | ICD-10-CM

## 2013-08-22 NOTE — Telephone Encounter (Signed)
In Dr. Thomes Dinning folder for completion.

## 2013-08-22 NOTE — Telephone Encounter (Signed)
Debra Saunders @ [facility] dropped off form to be filled out

## 2013-08-23 ENCOUNTER — Encounter: Payer: Self-pay | Admitting: Internal Medicine

## 2013-08-23 ENCOUNTER — Ambulatory Visit (INDEPENDENT_AMBULATORY_CARE_PROVIDER_SITE_OTHER): Payer: Medicare Other | Admitting: Internal Medicine

## 2013-08-23 VITALS — BP 122/80 | HR 56 | Temp 97.9°F | Wt 215.0 lb

## 2013-08-23 DIAGNOSIS — R079 Chest pain, unspecified: Secondary | ICD-10-CM

## 2013-08-23 DIAGNOSIS — I498 Other specified cardiac arrhythmias: Secondary | ICD-10-CM

## 2013-08-23 DIAGNOSIS — S91309A Unspecified open wound, unspecified foot, initial encounter: Secondary | ICD-10-CM

## 2013-08-23 DIAGNOSIS — R001 Bradycardia, unspecified: Secondary | ICD-10-CM

## 2013-08-23 DIAGNOSIS — I890 Lymphedema, not elsewhere classified: Secondary | ICD-10-CM

## 2013-08-23 MED ORDER — FUROSEMIDE 20 MG PO TABS: 20.0000 mg | ORAL_TABLET | Freq: Two times a day (BID) | ORAL | Status: DC

## 2013-08-23 MED ORDER — CYCLOBENZAPRINE HCL 10 MG PO TABS: 10.0000 mg | ORAL_TABLET | Freq: Three times a day (TID) | ORAL | Status: DC | PRN

## 2013-08-23 NOTE — Progress Notes (Signed)
Subjective:    Patient ID: Debra Saunders, female    DOB: 1946/03/25, 68 y.o.   MRN: [id number]  HPI 68YO female with CHF, chronic lymphedema presents to follow up foot wound.  Leg wound has resolved with wrapping her left leg and drainage of fluid from the area has stopped. She denies any new wounds.  She has not yet been evaluated at the wound healing center.  She was recently admitted to [facility] with chest pain. She notes frequent episodes of chest pain, shortness of breath, and palpitations at home. Periodically at home her heart rate was noted to be in the 30s. When she exerts herself, her heart rate is typically around 50. She was told that her chest pain was likely secondary to inflammation related to previous CPR. She was discharged from the hospital Flexeril. She notes some improvement on this medication.   Review of Systems  Constitutional: Positive for fatigue. Negative for fever, chills, appetite change and unexpected weight change.  HENT: Negative for congestion, ear pain, sinus pressure, sore throat, trouble swallowing and voice change.   Eyes: Negative for visual disturbance.  Respiratory: Positive for shortness of breath. Negative for cough, wheezing and stridor.   Cardiovascular: Positive for chest pain, palpitations and leg swelling.  Gastrointestinal: Negative for nausea, vomiting, abdominal pain, diarrhea, constipation, blood in stool, abdominal distention and anal bleeding.  Genitourinary: Negative for dysuria and flank pain.  Musculoskeletal: Negative for arthralgias, gait problem, myalgias and neck pain.  Skin: Negative for color change and rash.  Neurological: Negative for dizziness and headaches.  Hematological: Negative for adenopathy. Does not bruise/bleed easily.  Psychiatric/Behavioral: Negative for suicidal ideas, sleep disturbance and dysphoric mood. The patient is not nervous/anxious.        Objective:    BP 122/80  Pulse 56  Temp(Src) 97.9 F (36.6 C)  (Oral)  Wt 215 lb (97.523 kg)  SpO2 96% Physical Exam  Constitutional: She is oriented to person, place, and time. She appears well-developed and well-nourished. No distress.  HENT:  Head: Normocephalic and atraumatic.  Right Ear: External ear normal.  Left Ear: External ear normal.  Nose: Nose normal.  Mouth/Throat: Oropharynx is clear and moist. No oropharyngeal exudate.  Eyes: Conjunctivae are normal. Pupils are equal, round, and reactive to light. Right eye exhibits no discharge. Left eye exhibits no discharge. No scleral icterus.  Neck: Normal range of motion. Neck supple. No tracheal deviation present. No thyromegaly present.  Cardiovascular: Normal heart sounds and intact distal pulses.  An irregular rhythm present. Bradycardia present.  Exam reveals no gallop and no friction rub.   No murmur heard. Pulmonary/Chest: Effort normal and breath sounds normal. No accessory muscle usage. Not tachypneic. No respiratory distress. She has no decreased breath sounds. She has no wheezes. She has no rhonchi. She has no rales. She exhibits no tenderness.  Musculoskeletal: Normal range of motion. She exhibits edema (left lower leg with non-pitting edema, no wounds noted). She exhibits no tenderness.  Lymphadenopathy:    She has no cervical adenopathy.  Neurological: She is alert and oriented to person, place, and time. No cranial nerve deficit. She exhibits normal muscle tone. Coordination normal.  Skin: Skin is warm and dry. No rash noted. She is not diaphoretic. No erythema. No pallor.  Psychiatric: She has a normal mood and affect. Her behavior is normal. Judgment and thought content normal.          Assessment & Plan:   Problem List Items Addressed This Visit  Bradycardia     Bradycardia noted on exam today. Pt symptomatic with fatigue, dyspnea, and chest pain. Plan for Holter monitor and cardiology follow up with Dr. Fletcher Anon. Question if she may need pacemaker.    Relevant Orders       EKG 12-Lead (Completed)   Chest pain     Recent hospitalization for chest pain. Reviewed notes today. CT chest was negative for recurrent PE. Cardiac markers negative. Suspect chest pain in part secondary to costochondritis and inflammation after previous CPR. Symptoms are improved with Flexeril, and will continue this.     Lymphedema - Primary     Chronic lymphedema LLE, made worse by IVC filter placement and venous insufficiency. Symptoms improved with wrapping leg. Will set up PT for lymphedema therapy.    Relevant Orders      Ambulatory referral to Physical Therapy   Open wound of foot       Return in about 1 week (around 08/30/2013) for Recheck.

## 2013-08-23 NOTE — Progress Notes (Signed)
Pre visit review using our clinic review tool, if applicable. No additional management support is needed unless otherwise documented below in the visit note. 

## 2013-08-24 ENCOUNTER — Telehealth: Payer: Self-pay | Admitting: *Deleted

## 2013-08-24 NOTE — Assessment & Plan Note (Signed)
Bradycardia noted on exam today. Pt symptomatic with fatigue, dyspnea, and chest pain. Plan for Holter monitor and cardiology follow up with Dr. Fletcher Anon. Question if she may need pacemaker.

## 2013-08-24 NOTE — Assessment & Plan Note (Signed)
Chronic lymphedema LLE, made worse by IVC filter placement and venous insufficiency. Symptoms improved with wrapping leg. Will set up PT for lymphedema therapy.

## 2013-08-24 NOTE — Assessment & Plan Note (Signed)
Recent hospitalization for chest pain. Reviewed notes today. CT chest was negative for recurrent PE. Cardiac markers negative. Suspect chest pain in part secondary to costochondritis and inflammation after previous CPR. Symptoms are improved with Flexeril, and will continue this.

## 2013-08-24 NOTE — Assessment & Plan Note (Signed)
Wound healed. Will continue to monitor.

## 2013-08-24 NOTE — Telephone Encounter (Signed)
Message copied by Tracie Harrier on Wed Aug 24, 2013  9:06 AM ------      Message from: [name] A      Created: Tue Aug 23, 2013  5:16 PM       I stopped Coreg recently for that reason. I will order a 48 hour Holter monitor to be done before she comes to see me.       Elmyra Ricks, please arrange for a 48 hour holter monitor to be done this week (bradycardia).                         ----- Message -----         From: Jackolyn Confer, MD         Sent: 08/23/2013   4:14 PM           To: Wellington Hampshire, MD            Rogue Jury, I am trying to get Ms. Degrave in to see you this week. She is having HR in the 30s at home. See attached EKG. Question if she might need Holter.       ------

## 2013-08-24 NOTE — Telephone Encounter (Signed)
Patient left a message on voicemail stating the coban wrap on her leg from yesterday has gotten tighter. She has an indention in the front of her leg. Returned patient call and informed her to remove the coban off her leg and keep it elevated as much as possible since it was swollen. Patient verbalized understanding and agreed with plan.

## 2013-08-24 NOTE — Telephone Encounter (Signed)
Informed patient that [facility] will be contacting her to place a 48 hr holter monitor. Patient verbalized understanding. I asked patient to have it placed as soon as possible so we could get results before her next visit.

## 2013-09-01 ENCOUNTER — Ambulatory Visit: Payer: Medicare Other | Admitting: Internal Medicine

## 2013-09-02 DIAGNOSIS — E119 Type 2 diabetes mellitus without complications: Secondary | ICD-10-CM

## 2013-09-02 DIAGNOSIS — I509 Heart failure, unspecified: Secondary | ICD-10-CM

## 2013-09-02 DIAGNOSIS — R609 Edema, unspecified: Secondary | ICD-10-CM

## 2013-09-02 DIAGNOSIS — I10 Essential (primary) hypertension: Secondary | ICD-10-CM

## 2013-09-05 ENCOUNTER — Encounter: Payer: Self-pay | Admitting: Cardiovascular Disease

## 2013-09-05 ENCOUNTER — Ambulatory Visit (INDEPENDENT_AMBULATORY_CARE_PROVIDER_SITE_OTHER): Payer: Medicare Other | Admitting: Cardiovascular Disease

## 2013-09-05 ENCOUNTER — Encounter: Payer: Self-pay | Admitting: Adult Health

## 2013-09-05 ENCOUNTER — Ambulatory Visit (INDEPENDENT_AMBULATORY_CARE_PROVIDER_SITE_OTHER): Payer: Medicare Other | Admitting: Adult Health

## 2013-09-05 VITALS — BP 118/70 | HR 59 | Resp 16 | Wt 216.0 lb

## 2013-09-05 VITALS — BP 156/84 | HR 79 | Ht 61.0 in | Wt 217.0 lb

## 2013-09-05 DIAGNOSIS — I890 Lymphedema, not elsewhere classified: Secondary | ICD-10-CM

## 2013-09-05 DIAGNOSIS — R079 Chest pain, unspecified: Secondary | ICD-10-CM

## 2013-09-05 DIAGNOSIS — I498 Other specified cardiac arrhythmias: Secondary | ICD-10-CM

## 2013-09-05 DIAGNOSIS — I10 Essential (primary) hypertension: Secondary | ICD-10-CM

## 2013-09-05 DIAGNOSIS — I5032 Chronic diastolic (congestive) heart failure: Secondary | ICD-10-CM

## 2013-09-05 DIAGNOSIS — R001 Bradycardia, unspecified: Secondary | ICD-10-CM

## 2013-09-05 NOTE — Progress Notes (Addendum)
Primary care physician: Dr. Ronette Deter  HPI  This is a 68 -year-old female who is here today for a followup visit.  She has known history of chronic diastolic heart failure with pulmonary hypertension and bradycardia while on carvedilol. She has chronic left lower extremity swelling which has been evaluated in the past with venous duplex ultrasound on multiple occasions and was negative for DVT. She did have a nonocclusive DVT on ultrasound last year with subsequent IVC filter placement. She likely has lymphedema on the left side as well. Her echocardiogram in 2013 showed normal LV systolic function with mild diastolic dysfunction and evidence of elevated filling pressures. There was also moderate pulmonary hypertension with peak systolic pressure of 56 mm Hg. she underwent cardiac catheterization in August of 2013 which showed no evidence of obstructive coronary artery disease. She was noted to be severely hypertensive with significant elevation in left ventricular end-diastolic pressure.  She was hospitalized in July at [facility] for severe chest pain. Cardiac workup was overall negative. CT of the chest showed possible peripheral pulmonary emboli which really did not explain her symptoms. She was anticoagulated which was complicated by subarachnoid hemorrhage and cardiopulmonary arrest. She was transferred to [facility] and was treated conservatively. Anticoagulation was discontinued. Chest pain was felt to be due to esophagitis. She was discharged home but readmitted in August at [facility] with septic shock and acute renal failure requiring brief dialysis. She ultimately improved. Renal function went back to normal. During last visit, she was noted to be bradycardic. I discontinued the dose of carvedilol. She reports dizziness upon standing up no syncope. She has chronic dyspnea and chest pain. She feels that her heart rate has been dropping during the day but she does not record her pulse.  Allergies  Allergen  Reactions  . Other     Blood Thinners-per doctor at [facility]  . Penicillins Hives and Swelling     Current Outpatient Prescriptions on File Prior to Visit  Medication Sig Dispense Refill  . acetaminophen (TYLENOL) 325 MG tablet Take 325 mg by mouth every 6 (six) hours as needed for pain.      [name] amLODipine (NORVASC) 5 MG tablet Take 1 tablet (5 mg total) by mouth daily.  30 tablet  6  . chlorthalidone (HYGROTON) 25 MG tablet Take 0.5 tablets (12.5 mg total) by mouth daily.  30 tablet  5  . cyclobenzaprine (FLEXERIL) 10 MG tablet Take 1 tablet (10 mg total) by mouth 3 (three) times daily as needed for muscle spasms.  60 tablet  3  . furosemide (LASIX) 20 MG tablet Take 1 tablet (20 mg total) by mouth 2 (two) times daily.  60 tablet  6  . HYDROmorphone (DILAUDID) 2 MG tablet Take 2 mg by mouth every 4 (four) hours as needed for pain.      [name] levETIRAcetam (KEPPRA) 500 MG tablet Take 1 tablet (500 mg total) by mouth 2 (two) times daily.  60 tablet  0  . meclizine (ANTIVERT) 50 MG tablet Take 1 tablet (50 mg total) by mouth 2 (two) times daily as needed for dizziness.  30 tablet  0  . naproxen (EC NAPROSYN) 500 MG EC tablet Take 500 mg by mouth 2 (two) times daily with a meal.      . NON FORMULARY Oxygen 2.5 liters.      . NON FORMULARY CPAP      . polyethylene glycol powder (GLYCOLAX/MIRALAX) powder Take once daily as needed for constipation  3350  g  1  . potassium chloride SA (K-DUR,KLOR-CON) 20 MEQ tablet Take 1 tablet (20 mEq total) by mouth 2 (two) times daily.  60 tablet  2  . promethazine (PHENERGAN) 25 MG tablet Take 25 mg by mouth every 4 (four) hours as needed for nausea.      . Simethicone (GAS-X PO) Take by mouth as needed.        No current facility-administered medications on file prior to visit.     Past Medical History  Diagnosis Date  . Thyroid disorder   . Diabetes mellitus     type II  . Thyroid nodule   . Sleep apnea   . Obesity   . Hypertension   .  Hyperlipidemia   . Urinary incontinence   . CHF (congestive heart failure) 2005    Dx at [facility]  . Chronic back pain   . COPD (chronic obstructive pulmonary disease)     2L [place]  . Sleep apnea   . Lipoma of back   . Arthritis   . Streptococcal bacteremia   . Acute renal failure   . Stroke   . Sepsis with metabolic encephalopathy   . Gastritis      Past Surgical History  Procedure Laterality Date  . Cholecystectomy    . Vaginal hysterectomy    . Cardiac catheterization  2013    @ [place]: No obstructive CAD: Only 20% ostial left circumflex.  . Abdominal hysterectomy  1969  . Cyst removal trunk  2002    BACK  . Lipoma removal  2014    back     Family History  Problem Relation Age of Onset  . Heart disease Mother   . Hypertension Mother   . COPD Mother     was a smoker  . Thyroid disease Mother   . Hypertension Father   . Hypertension Sister   . Thyroid disease Sister   . Hypertension Sister   . Thyroid disease Sister   . Breast cancer Maternal Aunt      History   Social History  . Marital Status: Single    Spouse Name: N/A    Number of Children: N/A  . Years of Education: N/A   Occupational History  . Retired- factory work     exposed to [place]  . Smoking status: Former Smoker -- 0.30 packs/day for 2 years    Types: Cigarettes    Quit date: 06/23/1990  . Smokeless tobacco: Not on file  . Alcohol Use: No  . Drug Use: No  . Sexual Activity: Not on file   Other Topics Concern  . Not on file   Social History Narrative   Lives in [place] with daughter. Has 4 children. No pets.      Work - Restaurant manager, fast food, retired      Diet - regular       PHYSICAL EXAM   BP 156/84  Pulse 79  Ht 5\' 1"  (1.549 m)  Wt 217 lb (98.431 kg)  BMI 41.02 kg/m2  SpO2 98% Constitutional: She is oriented to person, place, and time. She appears well-developed and well-nourished. No distress.  HENT: No nasal discharge.  Head:  Normocephalic and atraumatic.  Eyes: Pupils are equal and round. Right eye exhibits no discharge. Left eye exhibits no discharge.  Neck: Normal range of motion. Neck supple. No JVD present. No thyromegaly present.  Cardiovascular: Normal rate, regular rhythm, normal heart sounds. Exam reveals no gallop  and no friction rub. No murmur heard.  Pulmonary/Chest: Effort normal and breath sounds normal. No stridor. No respiratory distress. She has no wheezes. She has no rales. She exhibits no tenderness.  Abdominal: Soft. Bowel sounds are normal. She exhibits no distension. There is no tenderness. There is no rebound and no guarding.  Musculoskeletal: Normal range of motion. She exhibits chronic left lower extremity edema and no tenderness.  Neurological: She is alert and oriented to person, place, and time. Coordination normal.  Skin: Skin is warm and dry. No rash noted. She is not diaphoretic. No erythema. No pallor.  Psychiatric: She has a normal mood and affect. Her behavior is normal. Judgment and thought content normal.     [id number]  Rhythm  -  Nonspecific T-abnormality.   Low voltage -possible pulmonary disease.   ABNORMAL     ASSESSMENT AND PLAN

## 2013-09-05 NOTE — Assessment & Plan Note (Signed)
She continues to have episodes of bradycardia even after stopping carvedilol. This was noted during her recent visit with Dr. Gilford Rile. However there is no documented heart rate of less than 40. Symptoms include dizziness with no syncope. She was supposed to get a Holter monitor done but that has not been done yet. She reports the symptoms of dizziness that we should be able to see some of these events on a Holter monitor.

## 2013-09-05 NOTE — Patient Instructions (Signed)
  [facility] will contact you with an appointment.  Please schedule a follow up visit with Dr. Gilford Rile in 4 weeks. Call 1 week prior to find out if she has the results of the holter monitor.  Please call with any questions or concerns.

## 2013-09-05 NOTE — Progress Notes (Signed)
Pre visit review using our clinic review tool, if applicable. No additional management support is needed unless otherwise documented below in the visit note. 

## 2013-09-05 NOTE — Assessment & Plan Note (Signed)
She appears to be euvolemic on current medications.

## 2013-09-05 NOTE — Assessment & Plan Note (Signed)
Blood pressure is reasonably controlled. 

## 2013-09-05 NOTE — Patient Instructions (Signed)
Your physician recommends that you continue on your current medications as directed. Please refer to the Current Medication list given to you today.  Your physician recommends that you schedule a follow-up appointment in:  3 months   If you do not receive info about your 48hr monitor by the end of this week please call us at [phone]

## 2013-09-05 NOTE — Progress Notes (Signed)
Patient ID: Debra Saunders, female   DOB: 1946-02-06, 68 y.o.   MRN: [id number]    Subjective:    Patient ID: Debra Saunders, female    DOB: [birth year], 68 y.o.   MRN: [id number]  HPI  Debra Saunders is a pleasant 68 y/o female who presents to clinic for f/u left foot wound. The area remains intact. There is no wound present. She has chronic lymphedema LLE, made worse by IVC filter placement and venous insufficiency. Symptoms have improved with wrapping leg. Dr. Gilford Rile referred pt to PT for lymphedema therapy at [facility]. Pt reports that [place] notified her that they do not do this therapy.    Past Medical History  Diagnosis Date  . Thyroid disorder   . Diabetes mellitus     type II  . Thyroid nodule   . Sleep apnea   . Obesity   . Hypertension   . Hyperlipidemia   . Urinary incontinence   . CHF (congestive heart failure) 2005    Dx at [facility]  . Chronic back pain   . COPD (chronic obstructive pulmonary disease)     2L [place]  . Sleep apnea   . Lipoma of back   . Arthritis   . Streptococcal bacteremia   . Acute renal failure   . Stroke   . Sepsis with metabolic encephalopathy   . Gastritis     Current Outpatient Prescriptions on File Prior to Visit  Medication Sig Dispense Refill  . acetaminophen (TYLENOL) 325 MG tablet Take 325 mg by mouth every 6 (six) hours as needed for pain.      [name] amLODipine (NORVASC) 5 MG tablet Take 1 tablet (5 mg total) by mouth daily.  30 tablet  6  . chlorthalidone (HYGROTON) 25 MG tablet Take 0.5 tablets (12.5 mg total) by mouth daily.  30 tablet  5  . cyclobenzaprine (FLEXERIL) 10 MG tablet Take 1 tablet (10 mg total) by mouth 3 (three) times daily as needed for muscle spasms.  60 tablet  3  . furosemide (LASIX) 20 MG tablet Take 1 tablet (20 mg total) by mouth 2 (two) times daily.  60 tablet  6  . HYDROmorphone (DILAUDID) 2 MG tablet Take 2 mg by mouth every 4 (four) hours as needed for pain.      [name] levETIRAcetam (KEPPRA) 500 MG tablet Take 1 tablet  (500 mg total) by mouth 2 (two) times daily.  60 tablet  0  . meclizine (ANTIVERT) 50 MG tablet Take 1 tablet (50 mg total) by mouth 2 (two) times daily as needed for dizziness.  30 tablet  0  . naproxen (EC NAPROSYN) 500 MG EC tablet Take 500 mg by mouth 2 (two) times daily with a meal.      . NON FORMULARY Oxygen 2.5 liters.      . NON FORMULARY CPAP      . polyethylene glycol powder (GLYCOLAX/MIRALAX) powder Take once daily as needed for constipation  3350 g  1  . potassium chloride SA (K-DUR,KLOR-CON) 20 MEQ tablet Take 1 tablet (20 mEq total) by mouth 2 (two) times daily.  60 tablet  2  . promethazine (PHENERGAN) 25 MG tablet Take 25 mg by mouth every 4 (four) hours as needed for nausea.      . Simethicone (GAS-X PO) Take by mouth as needed.        No current facility-administered medications on file prior to visit.    Review of Systems  Constitutional: Negative.   Respiratory:  Positive for shortness of breath (with exertion.). Negative for cough, chest tightness and wheezing.   Cardiovascular: Positive for leg swelling (LLE edema). Negative for chest pain.  Neurological: Negative.   All other systems reviewed and are negative.       Objective:  BP 118/70  Pulse 59  Resp 16  Wt 216 lb (97.977 kg)  SpO2 98%   Physical Exam  Constitutional: She is oriented to person, place, and time. No distress.  Cardiovascular: Regular rhythm.   Bradycardia (holter monitor ordered)  Pulmonary/Chest: Effort normal and breath sounds normal. No respiratory distress. She has no wheezes. She has no rales.  Musculoskeletal: She exhibits edema.  Non pitting edema LLE.  Neurological: She is alert and oriented to person, place, and time.  Skin: Skin is warm and dry.  Psychiatric: She has a normal mood and affect. Her behavior is normal. Judgment and thought content normal.        Assessment & Plan:   1. Lymphedema Referral to PT at [facility]. They will contact pt with appointment. She has an  appointment tomorrow with Dr. Lucky Cowboy to evaluate if there are options for her to improve edema. She will follow up with Dr. Gilford Rile in ~ 4 weeks for bradycardia.  - Ambulatory referral to Physical Therapy

## 2013-09-09 ENCOUNTER — Ambulatory Visit: Payer: Medicare Other | Admitting: Internal Medicine

## 2013-09-16 ENCOUNTER — Other Ambulatory Visit: Payer: Self-pay | Admitting: Internal Medicine

## 2013-09-16 NOTE — Telephone Encounter (Signed)
Are you refilling or should it be sent to her neurologist?

## 2013-09-16 NOTE — Telephone Encounter (Signed)
Should be refilled by neurologist, in case they are changing dose.

## 2013-09-19 ENCOUNTER — Telehealth: Payer: Self-pay | Admitting: Internal Medicine

## 2013-09-19 NOTE — Telephone Encounter (Signed)
Called and spoke with patient, informed her Dr. Gilford Rile refused refill on this medication because this needs to be refilled by her neurologist. Patient verbalized understanding and will give Dr. Lannie Fields office a call in reference to this.

## 2013-09-19 NOTE — Telephone Encounter (Signed)
Pt called checking on her refill of keppra She stated she call pharmacy Thursday and again today [facility] [address] Please advise pt Pt is complete out of meds

## 2013-09-20 ENCOUNTER — Telehealth: Payer: Self-pay | Admitting: *Deleted

## 2013-09-20 NOTE — Telephone Encounter (Signed)
Per Dr. Rockey Situ patient needs to see Dr. Caryl Comes on 09/22/13 Appt made  Patient informed  Patient verbalized understanding

## 2013-09-20 NOTE — Telephone Encounter (Signed)
Called to say they were faxing stat holter monitor results showing pauses   Fax given to Dr. Rockey Situ for review

## 2013-09-21 ENCOUNTER — Other Ambulatory Visit: Payer: Self-pay

## 2013-09-21 DIAGNOSIS — R001 Bradycardia, unspecified: Secondary | ICD-10-CM

## 2013-09-22 ENCOUNTER — Encounter: Payer: Self-pay | Admitting: Internal Medicine

## 2013-09-22 ENCOUNTER — Ambulatory Visit (INDEPENDENT_AMBULATORY_CARE_PROVIDER_SITE_OTHER): Payer: Medicare Other | Admitting: Internal Medicine

## 2013-09-22 VITALS — BP 148/90 | HR 63 | Ht 61.0 in | Wt 216.5 lb

## 2013-09-22 DIAGNOSIS — I272 Pulmonary hypertension, unspecified: Secondary | ICD-10-CM

## 2013-09-22 DIAGNOSIS — R079 Chest pain, unspecified: Secondary | ICD-10-CM

## 2013-09-22 DIAGNOSIS — I2789 Other specified pulmonary heart diseases: Secondary | ICD-10-CM

## 2013-09-22 NOTE — Progress Notes (Signed)
ELECTROPHYSIOLOGY CONSULT NOTE  Patient ID: Debra Saunders, MRN: [id number], DOB/AGE: Dec 13, 1945 68 y.o. Admit date: (Not on file) Date of Consult: 09/22/2013  Primary Physician: [name], MD Primary Cardiologist MA  Chief Complaint: bradycardia   HPI Debra Saunders is a 68 y.o. female  Referred because of an abnormal Holter monitor. This was undertaken because of documented bradycardia which has been present over some years and in the past has been associated with beta blockade.  She has a history of chronic lower extremity swelling and thought to have lymphedema particularly on her left side. Noted to have moderate pulmonary hypertension with catheterization 2013 demonstrating no disease. She did have significant diastolic dysfunction and elevated LVEDP.  She has episodes of recurrent weakness. These occur after moderate standing i.e. 3-5 minutes. They're relieved by rest. There are no associated palpitations. She denies shower or bath intolerance.  She also has spells of weakness where her exercise capacity is markedly diminished this and prompted the Holter monitor for the bradycardia was noted. There is unfortunately only one symptomatic events of recorded, and that was associated with junctional rhythm.  She's had problems with recurrent chest pain. In July 2014 she was hospitalized at [facility]. A CT scan showed possible pulmonary emboli. She underwent anticoagulation which is complicated by a subarachnoid hemorrhage and a cardiac arrest. [facility] not be confirmed. She developed transient renal failure requiring dialysis. Was ultimately felt that her chest pain was related to esophagitis.      Past Medical History  Diagnosis Date  . Thyroid disorder   . Diabetes mellitus     type II  . Thyroid nodule   . Sleep apnea   . Obesity   . Hypertension   . Hyperlipidemia   . Urinary incontinence   . CHF (congestive heart failure)  2005    Dx at [facility]  . Chronic back pain   . COPD (chronic obstructive pulmonary disease)     2L [place]  . Sleep apnea   . Lipoma of back   . Arthritis   . Streptococcal bacteremia   . Acute renal failure   . Stroke   . Sepsis with metabolic encephalopathy   . Gastritis       Surgical History:  Past Surgical History  Procedure Laterality Date  . Cholecystectomy    . Vaginal hysterectomy    . Cardiac catheterization  2013    @ [place]: No obstructive CAD: Only 20% ostial left circumflex.  . Abdominal hysterectomy  1969  . Cyst removal trunk  2002    BACK  . Lipoma removal  2014    back     Home Meds: Prior to Admission medications   Medication Sig Start Date End Date Taking? Authorizing Provider  acetaminophen (TYLENOL) 325 MG tablet Take 325 mg by mouth every 6 (six) hours as needed for pain.   Yes Historical Provider, MD  amLODipine (NORVASC) 5 MG tablet Take 1 tablet (5 mg total) by mouth daily. 08/03/13  Yes Jackolyn Confer, MD  chlorthalidone (HYGROTON) 25 MG tablet Take 0.5 tablets (12.5 mg total) by mouth daily. 05/12/13  Yes [name] Rey, NP  cyclobenzaprine (FLEXERIL) 10 MG tablet Take 1 tablet (10 mg total) by mouth 3 (three) times daily as needed for muscle spasms. 08/23/13  Yes Jackolyn Confer, MD  furosemide (LASIX) 20 MG tablet Take 1 tablet (20 mg total) by mouth 2 (two) times daily. 08/23/13  Yes Jackolyn Confer,  MD  HYDROmorphone (DILAUDID) 2 MG tablet Take 2 mg by mouth every 4 (four) hours as needed for pain.   Yes Historical Provider, MD  levETIRAcetam (KEPPRA) 500 MG tablet Take 1 tablet (500 mg total) by mouth 2 (two) times daily. 08/05/13  Yes Jackolyn Confer, MD  meclizine (ANTIVERT) 50 MG tablet Take 1 tablet (50 mg total) by mouth 2 (two) times daily as needed for dizziness. 04/22/13  Yes Marissa Sciacca, PA-C  naproxen (EC NAPROSYN) 500 MG EC tablet Take 500 mg by mouth 2 (two) times daily with a meal.   Yes Historical Provider, MD  NON FORMULARY Oxygen 2.5  liters.   Yes Historical Provider, MD  NON FORMULARY CPAP   Yes Historical Provider, MD  polyethylene glycol powder (GLYCOLAX/MIRALAX) powder Take once daily as needed for constipation 04/12/13  Yes [name] Rey, NP  potassium chloride SA (K-DUR,KLOR-CON) 20 MEQ tablet Take 1 tablet (20 mEq total) by mouth 2 (two) times daily. 08/03/13  Yes Jackolyn Confer, MD  promethazine (PHENERGAN) 25 MG tablet Take 25 mg by mouth every 4 (four) hours as needed for nausea.   Yes Historical Provider, MD  Simethicone (GAS-X PO) Take by mouth as needed.    Yes Historical Provider, MD   Allergies:  Allergies  Allergen Reactions  . Other     Blood Thinners-per doctor at [facility]  . Penicillins Hives and Swelling    History   Social History  . Marital Status: Single    Spouse Name: N/A    Number of Children: N/A  . Years of Education: N/A   Occupational History  . Retired- factory work     exposed to [place]  . Smoking status: Former Smoker -- 0.30 packs/day for 2 years    Types: Cigarettes    Quit date: 06/23/1990  . Smokeless tobacco: Not on file  . Alcohol Use: No  . Drug Use: No  . Sexual Activity: Not on file   Other Topics Concern  . Not on file   Social History Narrative   Lives in [place] with daughter. Has 4 children. No pets.      Work - Restaurant manager, fast food, retired      Diet - regular     Family History  Problem Relation Age of Onset  . Heart disease Mother   . Hypertension Mother   . COPD Mother     was a smoker  . Thyroid disease Mother   . Hypertension Father   . Hypertension Sister   . Thyroid disease Sister   . Hypertension Sister   . Thyroid disease Sister   . Breast cancer Maternal Aunt      ROS:  Please see the history of present illness.     All other systems reviewed and negative.    Physical Exam   Blood pressure 148/90, pulse 63, height 5\' 1"  (1.549 m), weight 216 lb 8 oz (98.204 kg). General: Well developed, obese  age appearing 17 [place]  female wearing oxygen Head: Normocephalic, atraumatic, sclera non-icteric, no xanthomas, nares are without discharge. EENT: normal Lymph Nodes:  none Back: without scoliosis/kyphosis, no CVA tendersness Neck: Negative for carotid bruits. JVD not elevated. Lungs: Clear bilaterally to auscultation without wheezes, rales, or rhonchi. Breathing is unlabored. Heart: RRR with S1 S2. No  murmur , rubs, or gallops appreciated. Abdomen: Soft, non-tender, non-distended with normoactive bowel sounds. No hepatomegaly. No rebound/guarding. No obvious abdominal masses. Msk:  Strength and  tone appear normal for age. Extremities: No clubbing or cyanosis. L>>R edema.  Distal pedal pulses are 2+ and equal bilaterally. Skin: Warm and Dry Neuro: Alert and oriented X 3. CN III-XII intact Grossly normal sensory and motor function . Psych:  Responds to questions appropriately with a normal affect.      Labs: Cardiac Enzymes No results found for this basename: CKTOTAL, CKMB, TROPONINI,  in the last 72 hours CBC Lab Results  Component Value Date   WBC 5.4 07/28/2013   HGB 11.6* 07/28/2013   HCT 37.2 07/28/2013   MCV 85.4 07/28/2013   PLT 241.0 07/28/2013   PROTIME: No results found for this basename: LABPROT, INR,  in the last 72 hours Chemistry No results found for this basename: NA, K, CL, CO2, BUN, CREATININE, CALCIUM, LABALBU, PROT, BILITOT, ALKPHOS, ALT, AST, GLUCOSE,  in the last 168 hours Lipids Lab Results  Component Value Date   CHOL 174 06/10/2013   HDL 50.60 06/10/2013   LDLCALC 90 06/10/2013   TRIG 166.0* 06/10/2013   BNP Pro B Natriuretic peptide (BNP)  Date/Time Value Ref Range Status  07/28/2013  4:33 PM 28.0  0.0 - 100.0 pg/mL Final   Miscellaneous No results found for this basename: DDIMER    Radiology/Studies:  No results found.  EKG: NSR  Holter  Difficult to interpret   Appears to have sinus node dysfunction with some interspersed atrial  tachycardia. She has daytime and nighttime rates in the 30s.she also has some heart rates appear to be regular I almost certainly sinus with rates in the 80s   Assessment and Plan:  Sinus node dysfunction  Episodic lightheadedness  Pulmonary hypertension  Systemic hypertension  On the patient meets indications for pacing based on heart rates less than 40 and potentially attributable symptoms, she is not inclined to pursue pacing unless we have a better idea and will improve her situation. I think this is reasonable.  We've also discussed potential use of a loop recorder to try to identify dissociation of symptoms with her heart rate. It is hard for me to think that she is chronotropic incompetent and she has numerous episodes on her pacemaker where her heart rates get in to 80s.  Orthostatic hypotension almost certainly explains his periods of weakness. We have discussed the role of ProAmatine. I will need to explore its utility or concerns in patients with pulmonary hypertension  Also if we were to use abdominal binder for orthostasis,  i would have to ask whether it would likely worsen her lymphedema   Debra Saunders

## 2013-09-22 NOTE — Patient Instructions (Signed)
We will call you with a follow up plan after Dr. Caryl Comes looks at your vitals

## 2013-09-28 ENCOUNTER — Encounter: Payer: Self-pay | Admitting: *Deleted

## 2013-09-28 ENCOUNTER — Ambulatory Visit (INDEPENDENT_AMBULATORY_CARE_PROVIDER_SITE_OTHER): Payer: Medicare Other

## 2013-09-28 DIAGNOSIS — I498 Other specified cardiac arrhythmias: Secondary | ICD-10-CM

## 2013-09-28 DIAGNOSIS — R001 Bradycardia, unspecified: Secondary | ICD-10-CM

## 2013-10-06 ENCOUNTER — Ambulatory Visit (INDEPENDENT_AMBULATORY_CARE_PROVIDER_SITE_OTHER): Payer: Medicare Other | Admitting: Internal Medicine

## 2013-10-06 ENCOUNTER — Encounter: Payer: Self-pay | Admitting: Internal Medicine

## 2013-10-06 VITALS — BP 128/62 | HR 62 | Resp 16 | Wt 220.2 lb

## 2013-10-06 DIAGNOSIS — E119 Type 2 diabetes mellitus without complications: Secondary | ICD-10-CM

## 2013-10-06 DIAGNOSIS — R609 Edema, unspecified: Secondary | ICD-10-CM

## 2013-10-06 DIAGNOSIS — I5032 Chronic diastolic (congestive) heart failure: Secondary | ICD-10-CM

## 2013-10-06 DIAGNOSIS — R001 Bradycardia, unspecified: Secondary | ICD-10-CM

## 2013-10-06 DIAGNOSIS — I498 Other specified cardiac arrhythmias: Secondary | ICD-10-CM

## 2013-10-06 DIAGNOSIS — I272 Pulmonary hypertension, unspecified: Secondary | ICD-10-CM

## 2013-10-06 DIAGNOSIS — I2789 Other specified pulmonary heart diseases: Secondary | ICD-10-CM

## 2013-10-06 DIAGNOSIS — Z23 Encounter for immunization: Secondary | ICD-10-CM

## 2013-10-06 DIAGNOSIS — R079 Chest pain, unspecified: Secondary | ICD-10-CM

## 2013-10-06 DIAGNOSIS — R600 Localized edema: Secondary | ICD-10-CM

## 2013-10-06 LAB — COMPREHENSIVE METABOLIC PANEL
ALT: 31 U/L (ref 0–35)
AST: 50 U/L — ABNORMAL HIGH (ref 0–37)
Albumin: 3.4 g/dL — ABNORMAL LOW (ref 3.5–5.2)
Alkaline Phosphatase: 101 U/L (ref 39–117)
BUN: 19 mg/dL (ref 6–23)
CO2: 32 mEq/L (ref 19–32)
Calcium: 9 mg/dL (ref 8.4–10.5)
Chloride: 97 mEq/L (ref 96–112)
Creatinine, Ser: 0.8 mg/dL (ref 0.4–1.2)
GFR: 91.13 mL/min (ref >60.00)
Glucose, Bld: 194 mg/dL — ABNORMAL HIGH (ref 70–99)
Potassium: 3.2 mEq/L — ABNORMAL LOW (ref 3.5–5.1)
Sodium: 137 mEq/L (ref 135–145)
Total Bilirubin: 0.5 mg/dL (ref 0.3–1.2)
Total Protein: 7.8 g/dL (ref 6.0–8.3)

## 2013-10-06 LAB — LIPID PANEL
Cholesterol: 187 mg/dL (ref 0–200)
HDL: 45.6 mg/dL (ref >39.00)
LDL Cholesterol: 87 mg/dL (ref 0–99)
Total CHOL/HDL Ratio: 4
Triglycerides: 271 mg/dL — ABNORMAL HIGH (ref 0.0–149.0)
VLDL: 54.2 mg/dL — ABNORMAL HIGH (ref 0.0–40.0)

## 2013-10-06 LAB — TSH: TSH: 0.71 u[IU]/mL (ref 0.35–5.50)

## 2013-10-06 LAB — HEMOGLOBIN A1C: Hgb A1c MFr Bld: 6.7 % — ABNORMAL HIGH (ref 4.6–6.5)

## 2013-10-06 MED ORDER — OXYCODONE-ACETAMINOPHEN 5-325 MG PO TABS: 1.0000 | ORAL_TABLET | Freq: Three times a day (TID) | ORAL | Status: DC | PRN

## 2013-10-06 MED ORDER — NAPROXEN 500 MG PO TBEC: 500.0000 mg | DELAYED_RELEASE_TABLET | Freq: Two times a day (BID) | ORAL | Status: DC | PRN

## 2013-10-06 NOTE — Assessment & Plan Note (Addendum)
Secondary to costochrondritis after previous CPR. Will use prn Naprosyn and Percocet for severe pain. She will call if symptoms are not improving.

## 2013-10-06 NOTE — Progress Notes (Signed)
Subjective:    Patient ID: Debra Saunders, female    DOB: July 24, 1945, 68 y.o.   MRN: [id number]  HPI 68YO female with DM, CHF, chronic lymphedema, h/o DVT presents for follow up.  DM - BG well controlled. Only >200 on 3 occasions.  Lymphedema - unchanged. Was told that [facility] no longer does PT for lymphedema.  Feeling short of breath lately, much worse with exertion. Chronic for her. Wearing oxygen 24/7, 2.5L/min by [place]. Having some chest pain anteriorly, described as soreness, especially when lying down at night. Was seen by Dr. Caryl Comes, who reportedly felt was c/w costochrondritis.  Review of Systems  Constitutional: Positive for fatigue. Negative for fever, chills, appetite change and unexpected weight change.  HENT: Negative for congestion, ear pain, sinus pressure, sore throat, trouble swallowing and voice change.   Eyes: Negative for visual disturbance.  Respiratory: Positive for shortness of breath. Negative for cough, wheezing and stridor.   Cardiovascular: Positive for chest pain and leg swelling. Negative for palpitations.  Gastrointestinal: Negative for nausea, vomiting, abdominal pain, diarrhea, constipation, blood in stool, abdominal distention and anal bleeding.  Genitourinary: Negative for dysuria and flank pain.  Musculoskeletal: Negative for arthralgias, gait problem, myalgias and neck pain.  Skin: Negative for color change and rash.  Neurological: Negative for dizziness and headaches.  Hematological: Negative for adenopathy. Does not bruise/bleed easily.  Psychiatric/Behavioral: Negative for suicidal ideas, sleep disturbance and dysphoric mood. The patient is not nervous/anxious.        Objective:    BP 128/62  Pulse 62  Resp 16  Wt 220 lb 4 oz (99.905 kg)  SpO2 97% Physical Exam  Constitutional: She is oriented to person, place, and time. She appears well-developed and well-nourished. No distress.  HENT:  Head: Normocephalic and atraumatic.  Right Ear:  External ear normal.  Left Ear: External ear normal.  Nose: Nose normal.  Mouth/Throat: Oropharynx is clear and moist. No oropharyngeal exudate.  Eyes: Conjunctivae are normal. Pupils are equal, round, and reactive to light. Right eye exhibits no discharge. Left eye exhibits no discharge. No scleral icterus.  Neck: Normal range of motion. Neck supple. No tracheal deviation present. No thyromegaly present.  Cardiovascular: Normal rate, regular rhythm, normal heart sounds and intact distal pulses.  Exam reveals no gallop and no friction rub.   No murmur heard. Pulmonary/Chest: Effort normal and breath sounds normal. No accessory muscle usage. Not tachypneic. No respiratory distress. She has no decreased breath sounds. She has no wheezes. She has no rhonchi. She has no rales. She exhibits no tenderness.  Musculoskeletal: Normal range of motion. She exhibits no edema and no tenderness.       Left ankle: She exhibits swelling (non-pitting edema left lower leg to upper shin).  Lymphadenopathy:    She has no cervical adenopathy.  Neurological: She is alert and oriented to person, place, and time. No cranial nerve deficit. She exhibits normal muscle tone. Coordination normal.  Skin: Skin is warm and dry. No rash noted. She is not diaphoretic. No erythema. No pallor.  Psychiatric: She has a normal mood and affect. Her behavior is normal. Judgment and thought content normal.          Assessment & Plan:   Problem List Items Addressed This Visit   Bradycardia     Reviewed recent notes from Dr. Caryl Comes. Plan for possible repeat Holter versus Loop monitor. Will follow.    Relevant Orders      TSH   Chest pain  Secondary to costochrondritis after previous CPR. Will use prn Naprosyn and Percocet for severe pain. She will call if symptoms are not improving.    Relevant Medications      OXYCODONE-acetaminophen  5-325 mg po tabs   Chronic diastolic heart failure     Appears euvolemic on exam today.  Will continue current medications.    Diabetes mellitus type 2, controlled - Primary     Reports BG well controlled. Will check A1c with labs today.    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 Will try to set up lymphedema therapy through home health.    Relevant Orders      Ambulatory referral to Home Health   Pulmonary hypertension     Symptomatically stable. Continue supplemental oxygen. Will request copy of recent cardiac ECHO.        Return in about 3 months (around 01/05/2014) for Recheck of Diabetes.

## 2013-10-06 NOTE — Assessment & Plan Note (Signed)
Reports BG well controlled. Will check A1c with labs today.

## 2013-10-06 NOTE — Assessment & Plan Note (Signed)
Appears euvolemic on exam today. Will continue current medications.

## 2013-10-06 NOTE — Addendum Note (Signed)
Addended by: Wynonia Lawman E on: 10/06/2013 04:15 PM   Modules accepted: Orders

## 2013-10-06 NOTE — Assessment & Plan Note (Signed)
Symptomatically stable. Continue supplemental oxygen. Will request copy of recent cardiac ECHO.

## 2013-10-06 NOTE — Assessment & Plan Note (Signed)
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 Will try to set up lymphedema therapy through home health.

## 2013-10-06 NOTE — Assessment & Plan Note (Signed)
Reviewed recent notes from Dr. Caryl Comes. Plan for possible repeat Holter versus Loop monitor. Will follow.

## 2013-10-06 NOTE — Progress Notes (Signed)
Pre visit review using our clinic review tool, if applicable. No additional management support is needed unless otherwise documented below in the visit note. 

## 2013-10-06 NOTE — Patient Instructions (Signed)
Use Naprosyn as needed for chest wall pain. If symptoms persist, then may take Percocet.  We will set up home health for lymphedema treatment.  Follow up 3 months and as needed.

## 2013-10-12 ENCOUNTER — Telehealth: Payer: Self-pay | Admitting: *Deleted

## 2013-10-12 NOTE — Telephone Encounter (Signed)
Anderson Malta, nurse with [facility] left a message on voicemail stating they received a request from our office to provide therapy lymphoedema to patient lower extremities. She wanted to inform you they do not provide therapy lymphoedema to lower extremities however they can do physical therapy. If this is another option then send a new referral request.

## 2013-10-12 NOTE — Telephone Encounter (Signed)
Amber - are there any home health companies that provide lymphedema therapy?

## 2013-10-14 NOTE — Telephone Encounter (Signed)
OK. We will just have to let her know.

## 2013-10-14 NOTE — Telephone Encounter (Signed)
Debra Saunders has said they could take care of this patient. I will get the infor to her when she comes by today.

## 2013-10-14 NOTE — Telephone Encounter (Signed)
Dr. Gilford Rile,  No one is contracted with [place] [employer]. There is not another option for the patient for lymphedema therapy.

## 2013-10-17 ENCOUNTER — Ambulatory Visit: Payer: Medicare Other | Admitting: Cardiovascular Disease

## 2013-10-19 ENCOUNTER — Telehealth: Payer: Self-pay

## 2013-10-19 NOTE — Telephone Encounter (Signed)
Relevant patient education assigned to patient using Emmi. ° °

## 2013-10-26 ENCOUNTER — Ambulatory Visit (INDEPENDENT_AMBULATORY_CARE_PROVIDER_SITE_OTHER): Payer: Medicare Other | Admitting: Pulmonary Disease

## 2013-10-26 ENCOUNTER — Encounter: Payer: Self-pay | Admitting: Pulmonary Disease

## 2013-10-26 VITALS — BP 146/74 | HR 53 | Ht 61.0 in | Wt 223.0 lb

## 2013-10-26 DIAGNOSIS — I5032 Chronic diastolic (congestive) heart failure: Secondary | ICD-10-CM

## 2013-10-26 DIAGNOSIS — G4733 Obstructive sleep apnea (adult) (pediatric): Secondary | ICD-10-CM

## 2013-10-26 DIAGNOSIS — R0602 Shortness of breath: Secondary | ICD-10-CM

## 2013-10-26 DIAGNOSIS — I2789 Other specified pulmonary heart diseases: Secondary | ICD-10-CM

## 2013-10-26 DIAGNOSIS — I272 Pulmonary hypertension, unspecified: Secondary | ICD-10-CM

## 2013-10-26 DIAGNOSIS — J309 Allergic rhinitis, unspecified: Secondary | ICD-10-CM

## 2013-10-26 NOTE — Patient Instructions (Signed)
Follow up with Dr. Caryl Comes regarding your heart rhythm problem Take the albuterol as needed for shortness of breath and let me know if it helps.  If it does I may give you an inhaler like symbicort to help Take generic zyrtec and nasacort for sinus congestion We will see you back in 2 months or sooner if needed

## 2013-10-26 NOTE — Assessment & Plan Note (Signed)
Add saline rinses, Zyrtec, Nasacort

## 2013-10-26 NOTE — Assessment & Plan Note (Addendum)
Appears euvolemic on exam today despite her weight being up.  Continue current therapy as outlined by Dr. Fletcher Anon

## 2013-10-26 NOTE — Assessment & Plan Note (Signed)
Continue CPAP.  

## 2013-10-26 NOTE — Assessment & Plan Note (Signed)
Her shortness of breath is certainly multifactorial but the increase in dyspnea lately concerns me. The differential diagnosis for this is broad and certainly deconditioning, and CHF her primary considerations. However, today she does not appear grossly volume overloaded. She has carried a diagnosis of COPD but we never said actually see any air flow obstruction on pulmonary function testing. It is possible that she may have a little bit of allergic induced chest congestion. So I have asked her to start using her albuterol inhaler again to see if that helps.  Her heart rhythm is abnormal today which she says has been evaluated recently. Apparently there is plans for an event monitor test. If there is no clear cardiac pathology that he would be reasonable to perform a right heart catheterization again to see if the pulmonary hypertension has progressed upper portion to her diastolic failure.

## 2013-10-26 NOTE — Progress Notes (Signed)
Subjective:    Patient ID: Debra Saunders, female    DOB: 1946-05-19, 68 y.o.   MRN: [id number]  Synopsis: Debra Saunders is a very pleasant female with obstructive sleep apnea, obesity, diastolic dysfunction, and pulmonary hypertension who is referred to the [facility] pulmonary clinic in November 2013 for evaluation of pulmonary hypertension. In 2005 she established care with Dr. Thom Chimes at [facility] for evaluation of the same. Dr. Gaynell Face felt that her pulmonary hypertension was do to her congestive heart failure, obesity, and obstructive sleep apnea. Unfortunately she lost her insurance around 2008 and did not have regular pulmonary visits. During the interval from 2008 2013 her shortness of breath increased and she gained about 50 pounds. She had a left heart catheterization in 2013 which showed very high filling pressures of the left ventricle as well as high systemic pressures but normal coronary arteries. In November 2013 her simple spirometry was completely normal. In 2014 she had a subarachnoid bleed and cardiac arrest and amazingly survived.  She had an IVC filter placed for PE.  HPI   10/26/2013 ROV > [name] has been feeling more short of breaht and has been cougihg a little since the last visit.  She feels like she has not been getting enough air in.  No wheezing. Her daughter feels like she has some sinus congestion but she she disagrees.  They have noticed her clearing her throat a lot.  She has been having shart pains when she takes a breath in.  Sh he pain in her right chest and breast. She continues to use her CPAP every night. She continues use her oxygen regularly. She has tried this active by walking around in her house but she is becoming more short of breath with this. She does not have a cough or wheeze. She continues to be compliant with her medications.  Past Medical History  Diagnosis Date  . Thyroid disorder   . Diabetes mellitus     type  II  . Thyroid nodule   . Sleep apnea   . Obesity   . Hypertension   . Hyperlipidemia   . Urinary incontinence   . CHF (congestive heart failure) 2005    Dx at [facility]  . Chronic back pain   . COPD (chronic obstructive pulmonary disease)     2L [place]  . Sleep apnea   . Lipoma of back   . Arthritis   . Streptococcal bacteremia   . Acute renal failure   . Stroke   . Sepsis with metabolic encephalopathy   . Gastritis      Review of Systems  Constitutional: Negative for fever, chills, activity change and fatigue.  HENT: Negative for congestion, postnasal drip and rhinorrhea.   Respiratory: Positive for shortness of breath. Negative for cough, chest tightness and wheezing.   Cardiovascular: Positive for leg swelling. Negative for chest pain and palpitations.       Objective:   Physical Exam  Filed Vitals:   10/26/13 1402  BP: 146/74  Pulse: 53  Height: 5\' 1"  (1.549 m)  Weight: 223 lb (101.152 kg)  SpO2: 99%  2.5L [place]  Gen: chronically ill appearing but in no acute distress HEENT: NCAT, PERRL, EOMi,  PULM: Crackles R base, otherwise clear CV: regular with PAC, no mgr, no JVD AB: BS+, soft, nontender, no hsm Ext: warm, improved but persistent edema left leg, non-tender, no redness or warmth, no clubbing, no cyanosis  2004 [facility]: right heart catheterization from 10/25/02  revealed a PA pressure of 62/25 (mean 36) and a mean PA pressure decreased remarkably to 20 during this catheterization. A PCWP was normal at 6 and a cardiac index of 2.42 L/m/m2 with an initial increase in vasodilator therapy to 2.71 L/m/m2 2004 6MW 313 meters (1,027 feet) On oxygen in 2004 04/22/2012 6 minute walk>> total distance walked 950 feet, heart rate ranged between 56 and 88, O2 saturation ranged between 91 and 98% on 2 L nasal cannula 11-26 Spirometry >> no obstruction, FEV1 1.42 L (70%) 11-26 6MW >> 795 Ft, O2 saturation 90% lowest on 2L 07/2012 6MW >> 1115 feet, O2 saturation 97% 2LNC      Assessment & Plan:   Shortness of breath Her shortness of breath is certainly multifactorial but the increase in dyspnea lately concerns me. The differential diagnosis for this is broad and certainly deconditioning, and CHF her primary considerations. However, today she does not appear grossly volume overloaded. She has carried a diagnosis of COPD but we never said actually see any air flow obstruction on pulmonary function testing. It is possible that she may have a little bit of allergic induced chest congestion. So I have asked her to start using her albuterol inhaler again to see if that helps.  Her heart rhythm is abnormal today which she says has been evaluated recently. Apparently there is plans for an event monitor test. If there is no clear cardiac pathology that he would be reasonable to perform a right heart catheterization again to see if the pulmonary hypertension has progressed upper portion to her diastolic failure.  OSA (obstructive sleep apnea) Continue CPAP  Pulmonary hypertension As above. If the remainder of the electrophysiology workup is normal then would proceed with right heart catheterization.  Chronic diastolic heart failure Appears euvolemic on exam today despite her weight being up.  Continue current therapy as outlined by Dr. Fletcher Anon  Allergic rhinitis Add saline rinses, Zyrtec, Nasacort    Updated Medication List Outpatient Encounter Prescriptions as of 10/26/2013  Medication Sig  . acetaminophen (TYLENOL) 325 MG tablet Take 325 mg by mouth every 6 (six) hours as needed for pain.  [name] amLODipine (NORVASC) 5 MG tablet Take 1 tablet (5 mg total) by mouth daily.  . chlorthalidone (HYGROTON) 25 MG tablet Take 0.5 tablets (12.5 mg total) by mouth daily.  . cyclobenzaprine (FLEXERIL) 10 MG tablet Take 1 tablet (10 mg total) by mouth 3 (three) times daily as needed for muscle spasms.  . furosemide (LASIX) 20 MG tablet Take 1 tablet (20 mg total) by mouth 2 (two) times  daily.  [name] levETIRAcetam (KEPPRA) 500 MG tablet Take 1 tablet (500 mg total) by mouth 2 (two) times daily.  . meclizine (ANTIVERT) 50 MG tablet Take 1 tablet (50 mg total) by mouth 2 (two) times daily as needed for dizziness.  . naproxen (EC NAPROSYN) 500 MG EC tablet Take 1 tablet (500 mg total) by mouth 2 (two) times daily as needed.  . NON FORMULARY Oxygen 2.5 liters.  . NON FORMULARY CPAP  . oxyCODONE-acetaminophen (ROXICET) 5-325 MG per tablet Take 1 tablet by mouth every 8 (eight) hours as needed for severe pain.  . polyethylene glycol powder (GLYCOLAX/MIRALAX) powder Take once daily as needed for constipation  . potassium chloride SA (K-DUR,KLOR-CON) 20 MEQ tablet Take 1 tablet (20 mEq total) by mouth 2 (two) times daily.  . promethazine (PHENERGAN) 25 MG tablet Take 25 mg by mouth every 4 (four) hours as needed for nausea.  . Simethicone (GAS-X  PO) Take by mouth as needed.

## 2013-10-26 NOTE — Assessment & Plan Note (Signed)
As above. If the remainder of the electrophysiology workup is normal then would proceed with right heart catheterization.

## 2013-11-17 LAB — CBC
HCT: 39.2 % (ref 35.0–47.0)
HGB: 12.3 g/dL (ref 12.0–16.0)
MCH: 26.5 pg (ref 26.0–34.0)
MCHC: 31.4 g/dL — ABNORMAL LOW (ref 32.0–36.0)
MCV: 84 fL (ref 80–100)
Platelet: 219 10*3/uL (ref 150–440)
RBC: 4.65 10*6/uL (ref 3.80–5.20)
RDW: 14 % (ref 11.5–14.5)
WBC: 6.2 10*3/uL (ref 3.6–11.0)

## 2013-11-17 LAB — TROPONIN I: Troponin-I: <0.02 ng/mL

## 2013-11-18 ENCOUNTER — Observation Stay: Payer: Self-pay | Admitting: Internal Medicine

## 2013-11-18 DIAGNOSIS — R079 Chest pain, unspecified: Secondary | ICD-10-CM

## 2013-11-18 DIAGNOSIS — I5032 Chronic diastolic (congestive) heart failure: Secondary | ICD-10-CM

## 2013-11-18 DIAGNOSIS — I279 Pulmonary heart disease, unspecified: Secondary | ICD-10-CM

## 2013-11-18 LAB — CBC WITH DIFFERENTIAL/PLATELET
Basophil #: 0.1 10*3/uL (ref 0.0–0.1)
Basophil %: 1.2 %
Eosinophil #: 0.2 10*3/uL (ref 0.0–0.7)
Eosinophil %: 2.6 %
HCT: 36.8 % (ref 35.0–47.0)
HGB: 11.5 g/dL — ABNORMAL LOW (ref 12.0–16.0)
Lymphocyte #: 1.5 10*3/uL (ref 1.0–3.6)
Lymphocyte %: 25 %
MCH: 26.7 pg (ref 26.0–34.0)
MCHC: 31.4 g/dL — ABNORMAL LOW (ref 32.0–36.0)
MCV: 85 fL (ref 80–100)
Monocyte #: 0.5 x10 3/mm (ref 0.2–0.9)
Monocyte %: 7.6 %
Neutrophil #: 3.8 10*3/uL (ref 1.4–6.5)
Neutrophil %: 63.6 %
Platelet: 196 10*3/uL (ref 150–440)
RBC: 4.32 10*6/uL (ref 3.80–5.20)
RDW: 14 % (ref 11.5–14.5)
WBC: 6 10*3/uL (ref 3.6–11.0)

## 2013-11-18 LAB — BASIC METABOLIC PANEL
Anion Gap: 6 — ABNORMAL LOW (ref 7–16)
Anion Gap: 9 (ref 7–16)
BUN: 15 mg/dL (ref 7–18)
BUN: 16 mg/dL (ref 7–18)
Calcium, Total: 9.1 mg/dL (ref 8.5–10.1)
Calcium, Total: 9 mg/dL (ref 8.5–10.1)
Chloride: 101 mmol/L (ref 98–107)
Chloride: 97 mmol/L — ABNORMAL LOW (ref 98–107)
Co2: 29 mmol/L (ref 21–32)
Co2: 30 mmol/L (ref 21–32)
Creatinine: 0.66 mg/dL (ref 0.60–1.30)
Creatinine: 0.73 mg/dL (ref 0.60–1.30)
EGFR (African American): >60
EGFR (African American): >60
EGFR (Non-African Amer.): >60
EGFR (Non-African Amer.): >60
Glucose: 159 mg/dL — ABNORMAL HIGH (ref 65–99)
Glucose: 248 mg/dL — ABNORMAL HIGH (ref 65–99)
Osmolality: 276 (ref 275–301)
Osmolality: 281 (ref 275–301)
Potassium: 3.3 mmol/L — ABNORMAL LOW (ref 3.5–5.1)
Potassium: 3.2 mmol/L — ABNORMAL LOW (ref 3.5–5.1)
Sodium: 136 mmol/L (ref 136–145)
Sodium: 136 mmol/L (ref 136–145)

## 2013-11-18 LAB — URINALYSIS, COMPLETE
Bacteria: NONE SEEN
Bilirubin,UR: NEGATIVE
Blood: NEGATIVE
Glucose,UR: NEGATIVE mg/dL (ref 0–75)
Ketone: NEGATIVE
Leukocyte Esterase: NEGATIVE
Nitrite: NEGATIVE
Ph: 6 (ref 4.5–8.0)
Protein: NEGATIVE
RBC,UR: 2 /HPF (ref 0–5)
Specific Gravity: 1.049 (ref 1.003–1.030)
Squamous Epithelial: 2
WBC UR: 1 /HPF (ref 0–5)

## 2013-11-18 LAB — CK-MB: CK-MB: 0.7 ng/mL (ref 0.5–3.6)

## 2013-11-18 LAB — TROPONIN I
Troponin-I: <0.02 ng/mL
Troponin-I: <0.02 ng/mL
Troponin-I: <0.02 ng/mL

## 2013-11-18 LAB — CK TOTAL AND CKMB (NOT AT ARMC)
CK, Total: 94 U/L
CK, Total: 79 U/L
CK-MB: 0.9 ng/mL (ref 0.5–3.6)
CK-MB: 0.6 ng/mL (ref 0.5–3.6)

## 2013-11-18 LAB — MAGNESIUM: Magnesium: 1.7 mg/dL — ABNORMAL LOW

## 2013-11-28 ENCOUNTER — Telehealth: Payer: Self-pay | Admitting: *Deleted

## 2013-11-28 NOTE — Telephone Encounter (Signed)
Notified Home care.

## 2013-11-28 NOTE — Telephone Encounter (Signed)
Fine to resume home care

## 2013-11-28 NOTE — Telephone Encounter (Signed)
Debra Saunders from [facility] called stating that pt was admitted into the hospital last week and needs new orders to resume care, requested hospital notes for your review.

## 2013-11-29 ENCOUNTER — Telehealth: Payer: Self-pay | Admitting: *Deleted

## 2013-11-29 DIAGNOSIS — R2681 Unsteadiness on feet: Secondary | ICD-10-CM

## 2013-11-29 NOTE — Telephone Encounter (Signed)
Olivia Mackie from [facility] called stating that pts certification for home health ran out while she was in the hospital last week and they are requesting last notes and written order for home health care to be faxed to [phone]

## 2013-11-29 NOTE — Telephone Encounter (Signed)
OK. I will place order in system and we can fax over.

## 2013-12-09 ENCOUNTER — Encounter: Payer: Self-pay | Admitting: Cardiovascular Disease

## 2013-12-09 ENCOUNTER — Ambulatory Visit (INDEPENDENT_AMBULATORY_CARE_PROVIDER_SITE_OTHER): Payer: Medicare Other | Admitting: Cardiovascular Disease

## 2013-12-09 VITALS — BP 139/86 | HR 53 | Ht 61.0 in | Wt 225.5 lb

## 2013-12-09 DIAGNOSIS — I498 Other specified cardiac arrhythmias: Secondary | ICD-10-CM

## 2013-12-09 DIAGNOSIS — R0789 Other chest pain: Secondary | ICD-10-CM

## 2013-12-09 DIAGNOSIS — I5032 Chronic diastolic (congestive) heart failure: Secondary | ICD-10-CM

## 2013-12-09 DIAGNOSIS — R001 Bradycardia, unspecified: Secondary | ICD-10-CM

## 2013-12-09 DIAGNOSIS — I10 Essential (primary) hypertension: Secondary | ICD-10-CM

## 2013-12-09 NOTE — Assessment & Plan Note (Signed)
She appears to be euvolemic on current medications.

## 2013-12-09 NOTE — Progress Notes (Signed)
Primary care physician: Dr. Ronette Deter  HPI  This is a 68 -year-old female who is here today for a followup visit.  She has known history of chronic diastolic heart failure with pulmonary hypertension, bradycardia and chronic atypical chest pain thought to be due to costochondritis. She has chronic left lower extremity swelling which has been evaluated in the past with venous duplex ultrasound on multiple occasions and was negative for DVT. She did have a nonocclusive DVT on ultrasound last year with subsequent IVC filter placement. She likely has lymphedema on the left side as well. Her echocardiogram in 2013 showed normal LV systolic function with mild diastolic dysfunction and evidence of elevated filling pressures. There was also moderate pulmonary hypertension with peak systolic pressure of 56 mm Hg. she underwent cardiac catheterization in August of 2013 which showed no evidence of obstructive coronary artery disease. She was noted to be severely hypertensive with significant elevation in left ventricular end-diastolic pressure.  She was hospitalized in July at [facility] for severe chest pain. Cardiac workup was overall negative. CT of the chest showed possible peripheral pulmonary emboli which really did not explain her symptoms. She was anticoagulated which was complicated by subarachnoid hemorrhage and cardiopulmonary arrest. She was transferred to [facility] and was treated conservatively. Anticoagulation was discontinued.[name] She was discharged home but readmitted in August at [facility] with septic shock and acute renal failure requiring brief dialysis. She ultimately improved. Renal function went back to normal. Carvedilol was discontinued due to bradycardia. She was briefly hospitalized at [facility] for atypical chest pain likely musculoskeletal. She continues to complain of worsening exertional dyspnea and fatigue. She continues to have bradycardia at home. After walking yesterday, she felt very tired. She  checked her heart rate and it was 43 beats per minute.   Allergies  Allergen Reactions  . Other     Blood Thinners-per doctor at [facility]  . Penicillins Hives and Swelling     Current Outpatient Prescriptions on File Prior to Visit  Medication Sig Dispense Refill  . acetaminophen (TYLENOL) 325 MG tablet Take 325 mg by mouth every 6 (six) hours as needed for pain.      [name] amLODipine (NORVASC) 5 MG tablet Take 1 tablet (5 mg total) by mouth daily.  30 tablet  6  . chlorthalidone (HYGROTON) 25 MG tablet Take 0.5 tablets (12.5 mg total) by mouth daily.  30 tablet  5  . cyclobenzaprine (FLEXERIL) 10 MG tablet Take 1 tablet (10 mg total) by mouth 3 (three) times daily as needed for muscle spasms.  60 tablet  3  . furosemide (LASIX) 20 MG tablet Take 1 tablet (20 mg total) by mouth 2 (two) times daily.  60 tablet  6  . levETIRAcetam (KEPPRA) 500 MG tablet Take 1 tablet (500 mg total) by mouth 2 (two) times daily.  60 tablet  0  . meclizine (ANTIVERT) 50 MG tablet Take 1 tablet (50 mg total) by mouth 2 (two) times daily as needed for dizziness.  30 tablet  0  . naproxen (EC NAPROSYN) 500 MG EC tablet Take 1 tablet (500 mg total) by mouth 2 (two) times daily as needed.  60 tablet  3  . NON FORMULARY Oxygen 3 liters daily.      . NON FORMULARY CPAP      . oxyCODONE-acetaminophen (ROXICET) 5-325 MG per tablet Take 1 tablet by mouth every 8 (eight) hours as needed for severe pain.  60 tablet  0  . polyethylene glycol powder (  GLYCOLAX/MIRALAX) powder Take once daily as needed for constipation  3350 g  1  . potassium chloride SA (K-DUR,KLOR-CON) 20 MEQ tablet Take 1 tablet (20 mEq total) by mouth 2 (two) times daily.  60 tablet  2  . promethazine (PHENERGAN) 25 MG tablet Take 25 mg by mouth every 4 (four) hours as needed for nausea.      . Simethicone (GAS-X PO) Take by mouth as needed.        No current facility-administered medications on file prior to visit.     Past Medical History    Diagnosis Date  . Thyroid disorder   . Diabetes mellitus     type II  . Thyroid nodule   . Sleep apnea   . Obesity   . Hypertension   . Hyperlipidemia   . Urinary incontinence   . CHF (congestive heart failure) 2005    Dx at [facility]  . Chronic back pain   . COPD (chronic obstructive pulmonary disease)     2L   . Sleep apnea   . Lipoma of back   . Arthritis   . Streptococcal bacteremia   . Acute renal failure   . Stroke   . Sepsis with metabolic encephalopathy   . Gastritis      Past Surgical History  Procedure Laterality Date  . Cholecystectomy    . Vaginal hysterectomy    . Cardiac catheterization  2013    @ [place]: No obstructive CAD: Only 20% ostial left circumflex.  . Abdominal hysterectomy  1969  . Cyst removal trunk  2002    BACK  . Lipoma removal  2014    back     Family History  Problem Relation Age of Onset  . Heart disease Mother   . Hypertension Mother   . COPD Mother     was a smoker  . Thyroid disease Mother   . Hypertension Father   . Hypertension Sister   . Thyroid disease Sister   . Hypertension Sister   . Thyroid disease Sister   . Breast cancer Maternal Aunt      History   Social History  . Marital Status: Single    Spouse Name: N/A    Number of Children: N/A  . Years of Education: N/A   Occupational History  . Retired- factory work     exposed to [place]  . Smoking status: Former Smoker -- 0.30 packs/day for 2 years    Types: Cigarettes    Quit date: 06/23/1990  . Smokeless tobacco: Not on file  . Alcohol Use: No  . Drug Use: No  . Sexual Activity: Not on file   Other Topics Concern  . Not on file   Social History Narrative   Lives in [place] with daughter. Has 4 children. No pets.      Work - Restaurant manager, fast food, retired      Diet - regular       PHYSICAL EXAM   BP 139/86  Pulse 53  Ht 5\' 1"  (1.549 m)  Wt 225 lb 8 oz (102.286 kg)  BMI 42.63 kg/m2 Constitutional: She is  oriented to person, place, and time. She appears well-developed and well-nourished. No distress.  HENT: No nasal discharge.  Head: Normocephalic and atraumatic.  Eyes: Pupils are equal and round. Right eye exhibits no discharge. Left eye exhibits no discharge.  Neck: Normal range of motion. Neck supple. No JVD present. No thyromegaly present.  Cardiovascular:  Normal rate, regular rhythm, normal heart sounds. Exam reveals no gallop and no friction rub. No murmur heard.  Pulmonary/Chest: Effort normal and breath sounds normal. No stridor. No respiratory distress. She has no wheezes. She has no rales. She exhibits no tenderness.  Abdominal: Soft. Bowel sounds are normal. She exhibits no distension. There is no tenderness. There is no rebound and no guarding.  Musculoskeletal: Normal range of motion. She exhibits chronic left lower extremity edema and no tenderness.  Neurological: She is alert and oriented to person, place, and time. Coordination normal.  Skin: Skin is warm and dry. No rash noted. She is not diaphoretic. No erythema. No pallor.  Psychiatric: She has a normal mood and affect. Her behavior is normal. Judgment and thought content normal.     [id number]  Bradycardia  -With rate variation  cv = 31. Sinus pause of 1.6 second Low voltage in precordial leads.   -Incomplete left bundle branch block.   -  Nonspecific T-abnormality.   ABNORMAL    ASSESSMENT AND PLAN

## 2013-12-09 NOTE — Assessment & Plan Note (Signed)
She continues to have issues with bradycardia and sinus pauses. She does have a 1.6 second pause on 12-lead EKG today. She also reports a heart rate of 43 beats per minute yesterday after walking. It is highly possible that she has chronotropic incompetence contributing to symptoms of exertional dyspnea and fatigue. I will discuss with Dr. Caryl Comes and recommend proceeding with a loop recorder. The patient and daughter prefer to go through this route other than direct pacemaker placement.

## 2013-12-09 NOTE — Assessment & Plan Note (Signed)
Blood pressure is well controlled on current medications. 

## 2013-12-09 NOTE — Patient Instructions (Signed)
I will discuss with Dr. Caryl Comes about a pacemaker or loop recorder.   Continue same medications.   Your physician wants you to follow-up in: 6 months.  You will receive a reminder letter in the mail two months in advance. If you don't receive a letter, please call our office to schedule the follow-up appointment.

## 2013-12-16 ENCOUNTER — Encounter: Payer: Self-pay | Admitting: Internal Medicine

## 2013-12-16 ENCOUNTER — Ambulatory Visit (INDEPENDENT_AMBULATORY_CARE_PROVIDER_SITE_OTHER): Payer: Medicare Other | Admitting: Internal Medicine

## 2013-12-16 VITALS — BP 124/70 | HR 59 | Temp 98.1°F | Ht 62.0 in | Wt 222.5 lb

## 2013-12-16 DIAGNOSIS — R0602 Shortness of breath: Secondary | ICD-10-CM

## 2013-12-16 DIAGNOSIS — E876 Hypokalemia: Secondary | ICD-10-CM

## 2013-12-16 DIAGNOSIS — D508 Other iron deficiency anemias: Secondary | ICD-10-CM

## 2013-12-16 DIAGNOSIS — I5032 Chronic diastolic (congestive) heart failure: Secondary | ICD-10-CM

## 2013-12-16 DIAGNOSIS — I890 Lymphedema, not elsewhere classified: Secondary | ICD-10-CM

## 2013-12-16 LAB — CBC WITH DIFFERENTIAL/PLATELET
Basophils Absolute: 0 10*3/uL (ref 0.0–0.1)
Basophils Relative: 0.7 % (ref 0.0–3.0)
Eosinophils Absolute: 0.2 10*3/uL (ref 0.0–0.7)
Eosinophils Relative: 3.6 % (ref 0.0–5.0)
HCT: 36.6 % (ref 36.0–46.0)
Hemoglobin: 11.8 g/dL — ABNORMAL LOW (ref 12.0–15.0)
Lymphocytes Relative: 27.2 % (ref 12.0–46.0)
Lymphs Abs: 1.7 10*3/uL (ref 0.7–4.0)
MCHC: 32.3 g/dL (ref 30.0–36.0)
MCV: 83.7 fl (ref 78.0–100.0)
Monocytes Absolute: 0.3 10*3/uL (ref 0.1–1.0)
Monocytes Relative: 5.2 % (ref 3.0–12.0)
Neutro Abs: 4 10*3/uL (ref 1.4–7.7)
Neutrophils Relative %: 63.3 % (ref 43.0–77.0)
Platelets: 242 10*3/uL (ref 150.0–400.0)
RBC: 4.37 Mil/uL (ref 3.87–5.11)
RDW: 13.9 % (ref 11.5–15.5)
WBC: 6.3 10*3/uL (ref 4.0–10.5)

## 2013-12-16 LAB — COMPREHENSIVE METABOLIC PANEL
ALT: 39 U/L — ABNORMAL HIGH (ref 0–35)
AST: 50 U/L — ABNORMAL HIGH (ref 0–37)
Albumin: 3.8 g/dL (ref 3.5–5.2)
Alkaline Phosphatase: 101 U/L (ref 39–117)
BUN: 18 mg/dL (ref 6–23)
CO2: 31 mEq/L (ref 19–32)
Calcium: 9 mg/dL (ref 8.4–10.5)
Chloride: 95 mEq/L — ABNORMAL LOW (ref 96–112)
Creatinine, Ser: 0.6 mg/dL (ref 0.4–1.2)
GFR: 124.54 mL/min (ref >60.00)
Glucose, Bld: 222 mg/dL — ABNORMAL HIGH (ref 70–99)
Potassium: 3.3 mEq/L — ABNORMAL LOW (ref 3.5–5.1)
Sodium: 136 mEq/L (ref 135–145)
Total Bilirubin: 0.6 mg/dL (ref 0.2–1.2)
Total Protein: 7.9 g/dL (ref 6.0–8.3)

## 2013-12-16 LAB — MAGNESIUM: Magnesium: 1.7 mg/dL (ref 1.5–2.5)

## 2013-12-16 NOTE — Assessment & Plan Note (Signed)
Mild anemia noted inpatient, with Hgb 11.5-12.Will recheck Hgb with labs today.

## 2013-12-16 NOTE — Assessment & Plan Note (Signed)
Chronic lymphedema LLE, made worse by IVC filter placement and venous insufficiency. Continue PT.

## 2013-12-16 NOTE — Progress Notes (Signed)
Subjective:    Patient ID: Debra Saunders, female    DOB: Sep 01, 1945, 68 y.o.   MRN: [id number]  HPI 68YO female presents for hospital follow up. Admitted [facility] 5/29 through 11/19/2013 for right sided chest and back pain. Had evaluation including CXR, CT chest which were stable and negative for recurrent PE. Cardiac eval with cardiac markers was normal. Noted to have mild anemia with Hgb 11.5 and low Mag inpatient, but has not had follow up for this.  Continues to feel fatigued and short of breath with minimal exertion such as walking a few steps. Seen by cardiology and plan is to set up loop monitor to see if bradycardia contributing. No recent chest pain. Continues to have left lower leg swelling. Worked with PT this morning and plans in place to start wrapping leg.  DM - blood sugars have been well controlled. Did not bring record today.  Review of Systems  Constitutional: Positive for fatigue. Negative for fever, chills, appetite change and unexpected weight change.  Eyes: Negative for visual disturbance.  Respiratory: Positive for shortness of breath.   Cardiovascular: Positive for leg swelling (left leg). Negative for chest pain.  Gastrointestinal: Negative for abdominal pain.  Skin: Negative for color change and rash.  Hematological: Negative for adenopathy. Does not bruise/bleed easily.  Psychiatric/Behavioral: Negative for dysphoric mood. The patient is not nervous/anxious.        Objective:    BP 124/70  Pulse 59  Temp(Src) 98.1 F (36.7 C) (Oral)  Ht 5\' 2"  (1.575 m)  Wt 222 lb 8 oz (100.925 kg)  BMI 40.69 kg/m2  SpO2 94% Physical Exam  Constitutional: She is oriented to person, place, and time. She appears well-developed and well-nourished. No distress.  HENT:  Head: Normocephalic and atraumatic.  Right Ear: External ear normal.  Left Ear: External ear normal.  Nose: Nose normal.  Mouth/Throat: Oropharynx is clear and moist. No oropharyngeal exudate.  Eyes:  Conjunctivae are normal. Pupils are equal, round, and reactive to light. Right eye exhibits no discharge. Left eye exhibits no discharge. No scleral icterus.  Neck: Normal range of motion. Neck supple. No tracheal deviation present. No thyromegaly present.  Cardiovascular: Normal rate, regular rhythm, normal heart sounds and intact distal pulses.  Exam reveals no gallop and no friction rub.   No murmur heard. Pulmonary/Chest: Effort normal and breath sounds normal. No accessory muscle usage. Not tachypneic. No respiratory distress. She has no decreased breath sounds. She has no wheezes. She has no rhonchi. She has no rales. She exhibits no tenderness.  Musculoskeletal: Normal range of motion. She exhibits edema (non-pitting edema left lower leg to knee). She exhibits no tenderness.  Lymphadenopathy:    She has no cervical adenopathy.  Neurological: She is alert and oriented to person, place, and time. No cranial nerve deficit. She exhibits normal muscle tone. Coordination normal.  Skin: Skin is warm and dry. No rash noted. She is not diaphoretic. No erythema. No pallor.  Psychiatric: She has a normal mood and affect. Her behavior is normal. Judgment and thought content normal.          Assessment & Plan:   Problem List Items Addressed This Visit     Unprioritized   Anemia - Primary     Mild anemia noted inpatient, with Hgb 11.5-12.Will recheck Hgb with labs today.    Relevant Orders      CBC with Differential   Chronic diastolic heart failure     Euvolemic on exam today.  Reviewed recent notes from hospitalization. Continue current medications.    Lymphedema     Chronic lymphedema LLE, made worse by IVC filter placement and venous insufficiency. Continue PT.     Shortness of breath     Persistent dyspnea. Pulmonary exam normal today. Suspect bradycardia playing a role in dyspnea and fatigue. Will follow up with cardiology re loop monitor.     Other Visit Diagnoses   Hypokalemia         Relevant Orders       Comprehensive metabolic panel    Hypomagnesemia        Relevant Orders       Magnesium        Return in about 4 weeks (around 01/13/2014).

## 2013-12-16 NOTE — Assessment & Plan Note (Signed)
Persistent dyspnea. Pulmonary exam normal today. Suspect bradycardia playing a role in dyspnea and fatigue. Will follow up with cardiology re loop monitor.

## 2013-12-16 NOTE — Patient Instructions (Signed)
Labs today.  We will touch base with Dr. Fletcher Anon about the loop monitor.  Follow up in 4 weeks or sooner as needed.

## 2013-12-16 NOTE — Progress Notes (Signed)
Pre visit review using our clinic review tool, if applicable. No additional management support is needed unless otherwise documented below in the visit note. 

## 2013-12-16 NOTE — Assessment & Plan Note (Signed)
Euvolemic on exam today. Reviewed recent notes from hospitalization. Continue current medications.

## 2013-12-21 ENCOUNTER — Encounter: Payer: Self-pay | Admitting: Internal Medicine

## 2013-12-21 ENCOUNTER — Telehealth: Payer: Self-pay | Admitting: *Deleted

## 2013-12-21 NOTE — Telephone Encounter (Signed)
Scheduled patient to see Dr. Caryl Comes on 7/23 to check heart rate with ambulation

## 2013-12-21 NOTE — Telephone Encounter (Signed)
Rescheduled patient to see Dr. Caryl Comes 12/27/13 at [place]  Patient verbalized understanding

## 2013-12-27 ENCOUNTER — Encounter: Payer: Self-pay | Admitting: *Deleted

## 2013-12-27 ENCOUNTER — Ambulatory Visit (INDEPENDENT_AMBULATORY_CARE_PROVIDER_SITE_OTHER): Payer: Medicare Other | Admitting: Internal Medicine

## 2013-12-27 ENCOUNTER — Encounter: Payer: Self-pay | Admitting: Internal Medicine

## 2013-12-27 VITALS — BP 120/80 | HR 54 | Ht 61.0 in | Wt 226.2 lb

## 2013-12-27 DIAGNOSIS — I495 Sick sinus syndrome: Secondary | ICD-10-CM

## 2013-12-27 DIAGNOSIS — R079 Chest pain, unspecified: Secondary | ICD-10-CM

## 2013-12-27 NOTE — Progress Notes (Signed)
Patient Care Team: Jackolyn Confer, MD as PCP - General (Internal Medicine) Thom Chimes, MD as Referring Physician (Allergy) Juanito Doom, MD as Referring Physician (Internal Medicine)   HPI  Debra Saunders is a 68 y.o. female Seen in followup for bradycardia which may or may not be symptomatic. There has been concern about chronotropic incompetence although Holter monitor 4/15 demonstrated a heart rate in the 60s and a peak heart rate of 107. She did have daytime heart rates however in the 30s.  She has significant limiting exercise intolerance. The question today is whether he would benefit from recorder. The underlying question has been whether she would benefit from pacing.   She has known left ventricular elevated diastolic pressures about 10 years ago. There has been interval 50 pound weight gain which amounts to about 20% of her body weight. She is now using oxygen all the time and is limited to about 100 -150 feet. She has some peripheral edema Past Medical History  Diagnosis Date  . Thyroid disorder   . Diabetes mellitus     type II  . Thyroid nodule   . Sleep apnea   . Obesity   . Hypertension   . Hyperlipidemia   . Urinary incontinence   . CHF (congestive heart failure) 2005    Dx at [facility]  . Chronic back pain   . COPD (chronic obstructive pulmonary disease)     2L [place]  . Sleep apnea   . Lipoma of back   . Arthritis   . Streptococcal bacteremia   . Acute renal failure   . Stroke   . Sepsis with metabolic encephalopathy   . Gastritis     Past Surgical History  Procedure Laterality Date  . Cholecystectomy    . Vaginal hysterectomy    . Cardiac catheterization  2013    @ [place]: No obstructive CAD: Only 20% ostial left circumflex.  . Abdominal hysterectomy  1969  . Cyst removal trunk  2002    BACK  . Lipoma removal  2014    back    Current Outpatient Prescriptions  Medication Sig Dispense Refill  . acetaminophen (TYLENOL) 325 MG tablet  Take 325 mg by mouth every 6 (six) hours as needed for pain.      [name] amLODipine (NORVASC) 5 MG tablet Take 1 tablet (5 mg total) by mouth daily.  30 tablet  6  . chlorthalidone (HYGROTON) 25 MG tablet Take 0.5 tablets (12.5 mg total) by mouth daily.  30 tablet  5  . cyclobenzaprine (FLEXERIL) 10 MG tablet Take 1 tablet (10 mg total) by mouth 3 (three) times daily as needed for muscle spasms.  60 tablet  3  . furosemide (LASIX) 20 MG tablet Take 1 tablet (20 mg total) by mouth 2 (two) times daily.  60 tablet  6  . levETIRAcetam (KEPPRA) 500 MG tablet Take 1 tablet (500 mg total) by mouth 2 (two) times daily.  60 tablet  0  . meclizine (ANTIVERT) 50 MG tablet Take 1 tablet (50 mg total) by mouth 2 (two) times daily as needed for dizziness.  30 tablet  0  . naproxen (EC NAPROSYN) 500 MG EC tablet Take 1 tablet (500 mg total) by mouth 2 (two) times daily as needed.  60 tablet  3  . NON FORMULARY Oxygen 3 liters daily.      . NON FORMULARY CPAP      . oxyCODONE-acetaminophen (ROXICET) 5-325 MG per tablet  Take 1 tablet by mouth every 8 (eight) hours as needed for severe pain.  60 tablet  0  . polyethylene glycol powder (GLYCOLAX/MIRALAX) powder Take once daily as needed for constipation  3350 g  1  . potassium chloride SA (K-DUR,KLOR-CON) 20 MEQ tablet Take 1 tablet (20 mEq total) by mouth 2 (two) times daily.  60 tablet  2  . promethazine (PHENERGAN) 25 MG tablet Take 25 mg by mouth every 4 (four) hours as needed for nausea.      . Simethicone (GAS-X PO) Take by mouth as needed.        No current facility-administered medications for this visit.    Allergies  Allergen Reactions  . Other     Blood Thinners-per doctor at [facility]  . Penicillins Hives and Swelling    Review of Systems negative except from HPI and PMH  Physical Exam BP 120/80  Pulse 54  Ht 5\' 1"  (1.549 m)  Wt 226 lb 4 oz (102.626 kg)  BMI 42.77 kg/m2 Well developed and well nourished in no acute distress HENT normal E  scleral and icterus clear Neck Supple JVP unable to discern carotids brisk and full Clear to ausculation Irregular  rate and rhythm, 2/6 systolic murmurSoft with active bowel sounds No clubbing cyanosis  Edema Alert and oriented, grossly normal motor and sensory function Skin Warm and Dry  ECG demonstrates sinus rhythm at 54 Interval 17/09/35 Nonspecific ST changes  Hall walk demonstrated maximum heart rate of 81  Assessment and  Plan  Chronotropic incompetence  Sinus bradycardia  Dyspnea on exertion  Secondary pulmonary hypertension  HFpEF  Hypertension  The patient has multifactorial shortness of breath. She does have however significant bradycardia at rest with daytime rates in the 30s and poor heart rate excursion with maximal heart rate with exercise about 80 beats per minute.  Is possible and likely that some of her symptoms can be ameliorated by pacing both to improve resting heart rate as well as heart rate excursion. We will use a Chemical engineer dual sensor device.  The benefits and risks were reviewed including but not limited to death,  perforation, infection, lead dislodgement and device malfunction.  The patient understands agrees and is willing to proceed.  There is some debate as to whether right heart catheterization is desired. For right now we will defer that. Potentially we can do that by way of her subclavian venous access

## 2013-12-27 NOTE — Patient Instructions (Signed)
Your physician has recommended that you have a pacemaker inserted. A pacemaker is a small device that is placed under the skin of your chest or abdomen to help control abnormal heart rhythms. This device uses electrical pulses to prompt the heart to beat at a normal rate. Pacemakers are used to treat heart rhythms that are too slow. Wire (leads) are attached to the pacemaker that goes into the chambers of you heart. This is done in the hospital and usually requires and overnight stay. Please see the instruction sheet given to you today for more information.  Your procedure with be at [facility] on 01/09/14 at 10 am. Please arrive at 8 am.  Your physician recommends that you have pre procedure labs today:  INR  BMP CBC

## 2013-12-28 ENCOUNTER — Telehealth: Payer: Self-pay | Admitting: *Deleted

## 2013-12-28 ENCOUNTER — Other Ambulatory Visit: Payer: Self-pay | Admitting: Internal Medicine

## 2013-12-28 LAB — CBC WITH DIFFERENTIAL/PLATELET
Basophil #: 0.1 10*3/uL (ref 0.0–0.1)
Basophil %: 1.3 %
Eosinophil #: 0.2 10*3/uL (ref 0.0–0.7)
Eosinophil %: 3.6 %
HCT: 36.1 % (ref 35.0–47.0)
HGB: 11.7 g/dL — ABNORMAL LOW (ref 12.0–16.0)
Lymphocyte #: 1.8 10*3/uL (ref 1.0–3.6)
Lymphocyte %: 29.1 %
MCH: 27.8 pg (ref 26.0–34.0)
MCHC: 32.3 g/dL (ref 32.0–36.0)
MCV: 86 fL (ref 80–100)
Monocyte #: 0.4 x10 3/mm (ref 0.2–0.9)
Monocyte %: 6.4 %
Neutrophil #: 3.7 10*3/uL (ref 1.4–6.5)
Neutrophil %: 59.6 %
Platelet: 206 10*3/uL (ref 150–440)
RBC: 4.2 10*6/uL (ref 3.80–5.20)
RDW: 13.6 % (ref 11.5–14.5)
WBC: 6.3 10*3/uL (ref 3.6–11.0)

## 2013-12-28 LAB — BASIC METABOLIC PANEL
Anion Gap: 6 — ABNORMAL LOW (ref 7–16)
BUN: 12 mg/dL (ref 7–18)
Calcium, Total: 9 mg/dL (ref 8.5–10.1)
Chloride: 96 mmol/L — ABNORMAL LOW (ref 98–107)
Co2: 33 mmol/L — ABNORMAL HIGH (ref 21–32)
Creatinine: 0.72 mg/dL (ref 0.60–1.30)
EGFR (African American): >60
EGFR (Non-African Amer.): >60
Glucose: 170 mg/dL — ABNORMAL HIGH (ref 65–99)
Osmolality: 274 (ref 275–301)
Potassium: 3.2 mmol/L — ABNORMAL LOW (ref 3.5–5.1)
Sodium: 135 mmol/L — ABNORMAL LOW (ref 136–145)

## 2013-12-28 LAB — PROTIME-INR
INR: 1
Prothrombin Time: 13.1 secs (ref 11.5–14.7)

## 2013-12-28 NOTE — Telephone Encounter (Signed)
Called patient to schedule wound check follow up post PPM. Scheduled for 7/30. Patient verbalized understanding and agreeable to plan.

## 2014-01-02 ENCOUNTER — Encounter (HOSPITAL_COMMUNITY): Payer: Self-pay | Admitting: Pharmacy Technician

## 2014-01-05 ENCOUNTER — Telehealth: Payer: Self-pay | Admitting: *Deleted

## 2014-01-05 NOTE — Telephone Encounter (Signed)
Received Home Health Certification and Plan of Treatment.  Form completed and mailed back to [facility].

## 2014-01-06 ENCOUNTER — Ambulatory Visit: Payer: Medicare Other | Admitting: Internal Medicine

## 2014-01-08 DIAGNOSIS — Z9981 Dependence on supplemental oxygen: Secondary | ICD-10-CM | POA: Diagnosis not present

## 2014-01-08 DIAGNOSIS — I495 Sick sinus syndrome: Secondary | ICD-10-CM | POA: Diagnosis not present

## 2014-01-08 DIAGNOSIS — G4730 Sleep apnea, unspecified: Secondary | ICD-10-CM | POA: Diagnosis not present

## 2014-01-08 DIAGNOSIS — Z791 Long term (current) use of non-steroidal anti-inflammatories (NSAID): Secondary | ICD-10-CM | POA: Diagnosis not present

## 2014-01-08 DIAGNOSIS — I2789 Other specified pulmonary heart diseases: Secondary | ICD-10-CM | POA: Diagnosis not present

## 2014-01-08 DIAGNOSIS — Z79899 Other long term (current) drug therapy: Secondary | ICD-10-CM | POA: Diagnosis not present

## 2014-01-08 DIAGNOSIS — I10 Essential (primary) hypertension: Secondary | ICD-10-CM | POA: Diagnosis not present

## 2014-01-08 DIAGNOSIS — I509 Heart failure, unspecified: Secondary | ICD-10-CM | POA: Diagnosis not present

## 2014-01-08 DIAGNOSIS — Z6841 Body Mass Index (BMI) 40.0 and over, adult: Secondary | ICD-10-CM | POA: Diagnosis not present

## 2014-01-08 DIAGNOSIS — E119 Type 2 diabetes mellitus without complications: Secondary | ICD-10-CM | POA: Diagnosis not present

## 2014-01-08 DIAGNOSIS — E785 Hyperlipidemia, unspecified: Secondary | ICD-10-CM | POA: Diagnosis not present

## 2014-01-08 DIAGNOSIS — E669 Obesity, unspecified: Secondary | ICD-10-CM | POA: Diagnosis not present

## 2014-01-08 DIAGNOSIS — J449 Chronic obstructive pulmonary disease, unspecified: Secondary | ICD-10-CM | POA: Diagnosis not present

## 2014-01-08 DIAGNOSIS — I4589 Other specified conduction disorders: Secondary | ICD-10-CM | POA: Diagnosis present

## 2014-01-08 MED ORDER — VANCOMYCIN HCL 10 G IV SOLR
1500.0000 mg | INTRAVENOUS | Status: DC
  Filled 2014-01-08: qty 1500

## 2014-01-08 MED ORDER — SODIUM CHLORIDE 0.9 % IR SOLN
80.0000 mg | Status: DC
  Filled 2014-01-08: qty 2

## 2014-01-08 MED ORDER — CHLORHEXIDINE GLUCONATE 4 % EX LIQD
60.0000 mL | Freq: Once | CUTANEOUS | Status: DC
  Filled 2014-01-08: qty 60

## 2014-01-08 MED ORDER — MUPIROCIN 2 % EX OINT
1.0000 "application " | TOPICAL_OINTMENT | Freq: Once | CUTANEOUS | Status: AC
  Administered 2014-01-09: 1 via TOPICAL
  Filled 2014-01-08: qty 22

## 2014-01-09 ENCOUNTER — Encounter (HOSPITAL_COMMUNITY): Payer: Self-pay | Admitting: *Deleted

## 2014-01-09 ENCOUNTER — Ambulatory Visit (HOSPITAL_COMMUNITY)
Admission: RE | Admit: 2014-01-09 | Discharge: 2014-01-10 | Disposition: A | Discharge status: Home / Self Care | Payer: Medicare Other | Source: Ambulatory Visit | Attending: Internal Medicine | Admitting: Internal Medicine

## 2014-01-09 ENCOUNTER — Encounter (HOSPITAL_COMMUNITY)
Admission: RE | Disposition: A | Discharge status: Home / Self Care | Payer: Self-pay | Source: Ambulatory Visit | Attending: Internal Medicine

## 2014-01-09 DIAGNOSIS — Z791 Long term (current) use of non-steroidal anti-inflammatories (NSAID): Secondary | ICD-10-CM | POA: Insufficient documentation

## 2014-01-09 DIAGNOSIS — J4489 Other specified chronic obstructive pulmonary disease: Secondary | ICD-10-CM | POA: Insufficient documentation

## 2014-01-09 DIAGNOSIS — Z79899 Other long term (current) drug therapy: Secondary | ICD-10-CM | POA: Insufficient documentation

## 2014-01-09 DIAGNOSIS — E669 Obesity, unspecified: Secondary | ICD-10-CM | POA: Insufficient documentation

## 2014-01-09 DIAGNOSIS — I10 Essential (primary) hypertension: Secondary | ICD-10-CM | POA: Insufficient documentation

## 2014-01-09 DIAGNOSIS — J449 Chronic obstructive pulmonary disease, unspecified: Secondary | ICD-10-CM | POA: Insufficient documentation

## 2014-01-09 DIAGNOSIS — I2789 Other specified pulmonary heart diseases: Secondary | ICD-10-CM | POA: Insufficient documentation

## 2014-01-09 DIAGNOSIS — Z950 Presence of cardiac pacemaker: Secondary | ICD-10-CM

## 2014-01-09 DIAGNOSIS — E119 Type 2 diabetes mellitus without complications: Secondary | ICD-10-CM | POA: Insufficient documentation

## 2014-01-09 DIAGNOSIS — I509 Heart failure, unspecified: Secondary | ICD-10-CM | POA: Insufficient documentation

## 2014-01-09 DIAGNOSIS — I495 Sick sinus syndrome: Secondary | ICD-10-CM | POA: Insufficient documentation

## 2014-01-09 DIAGNOSIS — G4730 Sleep apnea, unspecified: Secondary | ICD-10-CM | POA: Insufficient documentation

## 2014-01-09 DIAGNOSIS — E785 Hyperlipidemia, unspecified: Secondary | ICD-10-CM | POA: Insufficient documentation

## 2014-01-09 DIAGNOSIS — Z6841 Body Mass Index (BMI) 40.0 and over, adult: Secondary | ICD-10-CM | POA: Insufficient documentation

## 2014-01-09 DIAGNOSIS — Z9981 Dependence on supplemental oxygen: Secondary | ICD-10-CM | POA: Insufficient documentation

## 2014-01-09 HISTORY — PX: PERMANENT PACEMAKER INSERTION: SHX5480

## 2014-01-09 HISTORY — DX: Bradycardia, unspecified: R00.1

## 2014-01-09 HISTORY — DX: Presence of cardiac pacemaker: Z95.0

## 2014-01-09 LAB — GLUCOSE, CAPILLARY
Glucose-Capillary: 164 mg/dL — ABNORMAL HIGH (ref 70–99)
Glucose-Capillary: 215 mg/dL — ABNORMAL HIGH (ref 70–99)
Glucose-Capillary: 252 mg/dL — ABNORMAL HIGH (ref 70–99)

## 2014-01-09 LAB — SURGICAL PCR SCREEN
MRSA, PCR: NEGATIVE
Staphylococcus aureus: POSITIVE — AB

## 2014-01-09 LAB — BASIC METABOLIC PANEL
Anion gap: 12 (ref 5–15)
BUN: 19 mg/dL (ref 6–23)
CO2: 30 mEq/L (ref 19–32)
Calcium: 9.2 mg/dL (ref 8.4–10.5)
Chloride: 99 mEq/L (ref 96–112)
Creatinine, Ser: 0.64 mg/dL (ref 0.50–1.10)
GFR calc Af Amer: >90 mL/min (ref >90)
GFR calc non Af Amer: 88 mL/min — ABNORMAL LOW (ref >90)
Glucose, Bld: 202 mg/dL — ABNORMAL HIGH (ref 70–99)
Potassium: 4.3 mEq/L (ref 3.7–5.3)
Sodium: 141 mEq/L (ref 137–147)

## 2014-01-09 LAB — CBC
HCT: 37.2 % (ref 36.0–46.0)
Hemoglobin: 11.7 g/dL — ABNORMAL LOW (ref 12.0–15.0)
MCH: 27.1 pg (ref 26.0–34.0)
MCHC: 31.5 g/dL (ref 30.0–36.0)
MCV: 86.3 fL (ref 78.0–100.0)
Platelets: 223 10*3/uL (ref 150–400)
RBC: 4.31 MIL/uL (ref 3.87–5.11)
RDW: 13.2 % (ref 11.5–15.5)
WBC: 6.2 10*3/uL (ref 4.0–10.5)

## 2014-01-09 SURGERY — PERMANENT PACEMAKER INSERTION
Anesthesia: LOCAL

## 2014-01-09 MED ORDER — LIDOCAINE HCL (PF) 1 % IJ SOLN
INTRAMUSCULAR | Status: AC
Start: 2014-01-09 — End: 2014-01-09
  Filled 2014-01-09: qty 60

## 2014-01-09 MED ORDER — SODIUM CHLORIDE 0.9 % IV SOLN
INTRAVENOUS | Status: AC
  Administered 2014-01-09: 15:00:00 via INTRAVENOUS

## 2014-01-09 MED ORDER — AMLODIPINE BESYLATE 5 MG PO TABS
5.0000 mg | ORAL_TABLET | Freq: Every day | ORAL | Status: DC
  Administered 2014-01-10: 5 mg via ORAL
  Filled 2014-01-09: qty 1

## 2014-01-09 MED ORDER — FUROSEMIDE 20 MG PO TABS
20.0000 mg | ORAL_TABLET | Freq: Two times a day (BID) | ORAL | Status: DC
  Administered 2014-01-09 – 2014-01-10 (×2): 20 mg via ORAL
  Filled 2014-01-09 (×4): qty 1

## 2014-01-09 MED ORDER — LEVETIRACETAM 500 MG PO TABS
500.0000 mg | ORAL_TABLET | Freq: Two times a day (BID) | ORAL | Status: DC
  Administered 2014-01-09 – 2014-01-10 (×2): 500 mg via ORAL
  Filled 2014-01-09 (×4): qty 1

## 2014-01-09 MED ORDER — INSULIN ASPART 100 UNIT/ML ~~LOC~~ SOLN: 0.0000 [IU] | Freq: Every day | SUBCUTANEOUS | Status: DC

## 2014-01-09 MED ORDER — BIOTENE DRY MOUTH MT LIQD: 15.0000 mL | OROMUCOSAL | Status: DC | PRN

## 2014-01-09 MED ORDER — MIDAZOLAM HCL 5 MG/5ML IJ SOLN
INTRAMUSCULAR | Status: AC
  Filled 2014-01-09: qty 5

## 2014-01-09 MED ORDER — SODIUM CHLORIDE 0.9 % IV SOLN
INTRAVENOUS | Status: DC
  Administered 2014-01-09: 09:00:00 via INTRAVENOUS

## 2014-01-09 MED ORDER — VANCOMYCIN HCL IN DEXTROSE 1-5 GM/200ML-% IV SOLN
1000.0000 mg | Freq: Two times a day (BID) | INTRAVENOUS | Status: AC
  Administered 2014-01-09: 1000 mg via INTRAVENOUS
  Filled 2014-01-09: qty 200

## 2014-01-09 MED ORDER — MUPIROCIN 2 % EX OINT
TOPICAL_OINTMENT | CUTANEOUS | Status: AC
  Filled 2014-01-09: qty 22

## 2014-01-09 MED ORDER — FENTANYL CITRATE 0.05 MG/ML IJ SOLN
INTRAMUSCULAR | Status: AC
  Filled 2014-01-09: qty 2

## 2014-01-09 MED ORDER — SODIUM CHLORIDE 0.9 % IV SOLN: INTRAVENOUS | Status: DC

## 2014-01-09 MED ORDER — OXYCODONE-ACETAMINOPHEN 5-325 MG PO TABS
0.5000 | ORAL_TABLET | Freq: Three times a day (TID) | ORAL | Status: DC | PRN
  Administered 2014-01-09 – 2014-01-10 (×2): 0.5 via ORAL
  Filled 2014-01-09 (×2): qty 1

## 2014-01-09 MED ORDER — LIDOCAINE HCL (PF) 1 % IJ SOLN
INTRAMUSCULAR | Status: AC
  Filled 2014-01-09: qty 60

## 2014-01-09 MED ORDER — POTASSIUM CHLORIDE CRYS ER 20 MEQ PO TBCR
20.0000 meq | EXTENDED_RELEASE_TABLET | Freq: Two times a day (BID) | ORAL | Status: DC
  Administered 2014-01-09 – 2014-01-10 (×2): 20 meq via ORAL
  Filled 2014-01-09 (×3): qty 1

## 2014-01-09 MED ORDER — ONDANSETRON HCL 4 MG/2ML IJ SOLN
4.0000 mg | Freq: Four times a day (QID) | INTRAMUSCULAR | Status: DC | PRN
  Administered 2014-01-10: 4 mg via INTRAVENOUS
  Filled 2014-01-09: qty 2

## 2014-01-09 MED ORDER — HEPARIN (PORCINE) IN NACL 2-0.9 UNIT/ML-% IJ SOLN
INTRAMUSCULAR | Status: AC
  Filled 2014-01-09: qty 500

## 2014-01-09 MED ORDER — INSULIN ASPART 100 UNIT/ML ~~LOC~~ SOLN
0.0000 [IU] | Freq: Three times a day (TID) | SUBCUTANEOUS | Status: DC
  Administered 2014-01-10: 2 [IU] via SUBCUTANEOUS
  Administered 2014-01-10: 3 [IU] via SUBCUTANEOUS

## 2014-01-09 MED ORDER — CHLORTHALIDONE 25 MG PO TABS
12.5000 mg | ORAL_TABLET | Freq: Every day | ORAL | Status: DC
  Administered 2014-01-10: 12.5 mg via ORAL
  Filled 2014-01-09 (×2): qty 0.5

## 2014-01-09 MED ORDER — POLYETHYLENE GLYCOL 3350 17 G PO PACK
17.0000 g | PACK | Freq: Every day | ORAL | Status: DC | PRN
  Filled 2014-01-09: qty 1

## 2014-01-09 MED ORDER — CHLORHEXIDINE GLUCONATE 4 % EX LIQD
60.0000 mL | Freq: Once | CUTANEOUS | Status: DC
  Filled 2014-01-09: qty 60

## 2014-01-09 MED ORDER — VANCOMYCIN HCL 10 G IV SOLR: 1500.0000 mg | INTRAVENOUS | Status: DC

## 2014-01-09 MED ORDER — ACETAMINOPHEN 325 MG PO TABS
325.0000 mg | ORAL_TABLET | ORAL | Status: DC | PRN
  Administered 2014-01-09 – 2014-01-10 (×2): 650 mg via ORAL
  Filled 2014-01-09 (×2): qty 2

## 2014-01-09 MED ORDER — SODIUM CHLORIDE 0.9 % IR SOLN: 80.0000 mg | Status: DC

## 2014-01-09 MED ORDER — PROMETHAZINE HCL 25 MG PO TABS: 25.0000 mg | ORAL_TABLET | ORAL | Status: DC | PRN

## 2014-01-09 MED ORDER — CYCLOBENZAPRINE HCL 10 MG PO TABS
10.0000 mg | ORAL_TABLET | Freq: Three times a day (TID) | ORAL | Status: DC | PRN
  Filled 2014-01-09: qty 1

## 2014-01-09 NOTE — Interval H&P Note (Signed)
History and Physical Interval Note:  01/09/2014 11:15 AM  Debra Saunders  has presented today for surgery, with the diagnosis of snd  The various methods of treatment have been discussed with the patient and family. After consideration of risks, benefits and other options for treatment, the patient has consented to  Procedure(s): PERMANENT PACEMAKER INSERTION (N/A) as a surgical intervention .  The patient's history has been reviewed, patient examined, no change in status, stable for surgery.  I have reviewed the patient's chart and labs.  Questions were answered to the patient's satisfaction.     Virl Axe

## 2014-01-09 NOTE — CV Procedure (Signed)
Preop DX::chronotropic incompetence  Post op DX:: same  Procedure dual pacemaker implantation  After routine prep and drape, lidocaine was infiltrated in the prepectoral subclavicular region on the left side an incision was made and carried down to later the prepectoral fascia using electrocautery and sharp dissection a pocket was formed similarly. Hemostasis was obtained.  After this, we turned our attention to gaining accessm to the extrathoracic,left subclavian vein. This was accomplished without difficulty and without the aspiration of air or puncture of the artery. 2 separate venipunctures were accomplished; guidewires were placed and retained and sequentially 7 French sheath through which were  passed an Medtronic 5076 ventricular lead serial [id number] and an Medtronic 5076 atrial lead serial number [id number] .  The ventricular lead was manipulated to the right ventricular apex with a bipolar R wave was 18.1, the pacing impedance was 1021, the threshold was 0.5 @ 0.5 msec  Current at threshold was   0.5  Ma and the current of injury was  BRISK.  The right atrial lead was manipulated to the right atrial appendage with a bipolar P-wave  4 the pacing impedance was 675he threshold 1.4.40msec   Current at threshold was 2.5a and the current of injury was BRISK  The leads were affixed to the prepectoral fascia and attached to a  [employer]  pulse generator serial number [id number]  Hemostasis was obtained. The pocket was copiously irrigated with antibiotic containing saline solution. The leads and the pulse generator were placed in the pocket and affixed to the prepectoral fascia. The wound was then closed in 2 layers in the normal fashion.  The wound was washed dried . And a dermabond adhesive was applied   Needle  Count, sponge counts and instrument counts were correct at the end of the procedure .   The patient tolerated the procedure without apparent complication.  Maryagnes Amos.D.

## 2014-01-09 NOTE — Progress Notes (Signed)
UR Completed Masai Kidd Graves-Bigelow, RN,BSN [phone]  

## 2014-01-09 NOTE — H&P (View-Only) (Signed)
Patient Care Team: Jackolyn Confer, MD as PCP - General (Internal Medicine) Thom Chimes, MD as Referring Physician (Allergy) Juanito Doom, MD as Referring Physician (Internal Medicine)   HPI  Debra Saunders is a 68 y.o. female Seen in followup for bradycardia which may or may not be symptomatic. There has been concern about chronotropic incompetence although Holter monitor 4/15 demonstrated a heart rate in the 60s and a peak heart rate of 107. She did have daytime heart rates however in the 30s.  She has significant limiting exercise intolerance. The question today is whether he would benefit from recorder. The underlying question has been whether she would benefit from pacing.   She has known left ventricular elevated diastolic pressures about 10 years ago. There has been interval 50 pound weight gain which amounts to about 20% of her body weight. She is now using oxygen all the time and is limited to about 100 -150 feet. She has some peripheral edema Past Medical History  Diagnosis Date  . Thyroid disorder   . Diabetes mellitus     type II  . Thyroid nodule   . Sleep apnea   . Obesity   . Hypertension   . Hyperlipidemia   . Urinary incontinence   . CHF (congestive heart failure) 2005    Dx at [facility]  . Chronic back pain   . COPD (chronic obstructive pulmonary disease)     2L [place]  . Sleep apnea   . Lipoma of back   . Arthritis   . Streptococcal bacteremia   . Acute renal failure   . Stroke   . Sepsis with metabolic encephalopathy   . Gastritis     Past Surgical History  Procedure Laterality Date  . Cholecystectomy    . Vaginal hysterectomy    . Cardiac catheterization  2013    @ [place]: No obstructive CAD: Only 20% ostial left circumflex.  . Abdominal hysterectomy  1969  . Cyst removal trunk  2002    BACK  . Lipoma removal  2014    back    Current Outpatient Prescriptions  Medication Sig Dispense Refill  . acetaminophen (TYLENOL) 325 MG tablet  Take 325 mg by mouth every 6 (six) hours as needed for pain.      [name] amLODipine (NORVASC) 5 MG tablet Take 1 tablet (5 mg total) by mouth daily.  30 tablet  6  . chlorthalidone (HYGROTON) 25 MG tablet Take 0.5 tablets (12.5 mg total) by mouth daily.  30 tablet  5  . cyclobenzaprine (FLEXERIL) 10 MG tablet Take 1 tablet (10 mg total) by mouth 3 (three) times daily as needed for muscle spasms.  60 tablet  3  . furosemide (LASIX) 20 MG tablet Take 1 tablet (20 mg total) by mouth 2 (two) times daily.  60 tablet  6  . levETIRAcetam (KEPPRA) 500 MG tablet Take 1 tablet (500 mg total) by mouth 2 (two) times daily.  60 tablet  0  . meclizine (ANTIVERT) 50 MG tablet Take 1 tablet (50 mg total) by mouth 2 (two) times daily as needed for dizziness.  30 tablet  0  . naproxen (EC NAPROSYN) 500 MG EC tablet Take 1 tablet (500 mg total) by mouth 2 (two) times daily as needed.  60 tablet  3  . NON FORMULARY Oxygen 3 liters daily.      . NON FORMULARY CPAP      . oxyCODONE-acetaminophen (ROXICET) 5-325 MG per tablet  Take 1 tablet by mouth every 8 (eight) hours as needed for severe pain.  60 tablet  0  . polyethylene glycol powder (GLYCOLAX/MIRALAX) powder Take once daily as needed for constipation  3350 g  1  . potassium chloride SA (K-DUR,KLOR-CON) 20 MEQ tablet Take 1 tablet (20 mEq total) by mouth 2 (two) times daily.  60 tablet  2  . promethazine (PHENERGAN) 25 MG tablet Take 25 mg by mouth every 4 (four) hours as needed for nausea.      . Simethicone (GAS-X PO) Take by mouth as needed.        No current facility-administered medications for this visit.    Allergies  Allergen Reactions  . Other     Blood Thinners-per doctor at [facility]  . Penicillins Hives and Swelling    Review of Systems negative except from HPI and PMH  Physical Exam BP 120/80  Pulse 54  Ht 5\' 1"  (1.549 m)  Wt 226 lb 4 oz (102.626 kg)  BMI 42.77 kg/m2 Well developed and well nourished in no acute distress HENT normal E  scleral and icterus clear Neck Supple JVP unable to discern carotids brisk and full Clear to ausculation Irregular  rate and rhythm, 2/6 systolic murmurSoft with active bowel sounds No clubbing cyanosis  Edema Alert and oriented, grossly normal motor and sensory function Skin Warm and Dry  ECG demonstrates sinus rhythm at 54 Interval 17/09/35 Nonspecific ST changes  Hall walk demonstrated maximum heart rate of 81  Assessment and  Plan  Chronotropic incompetence  Sinus bradycardia  Dyspnea on exertion  Secondary pulmonary hypertension  HFpEF  Hypertension  The patient has multifactorial shortness of breath. She does have however significant bradycardia at rest with daytime rates in the 30s and poor heart rate excursion with maximal heart rate with exercise about 80 beats per minute.  Is possible and likely that some of her symptoms can be ameliorated by pacing both to improve resting heart rate as well as heart rate excursion. We will use a Chemical engineer dual sensor device.  The benefits and risks were reviewed including but not limited to death,  perforation, infection, lead dislodgement and device malfunction.  The patient understands agrees and is willing to proceed.  There is some debate as to whether right heart catheterization is desired. For right now we will defer that. Potentially we can do that by way of her subclavian venous access

## 2014-01-09 NOTE — Progress Notes (Signed)
Placed pt. On cpap. Pt. Is tolerating well at this time.  

## 2014-01-10 ENCOUNTER — Ambulatory Visit (HOSPITAL_COMMUNITY): Payer: Medicare Other

## 2014-01-10 DIAGNOSIS — I10 Essential (primary) hypertension: Secondary | ICD-10-CM | POA: Diagnosis not present

## 2014-01-10 DIAGNOSIS — I495 Sick sinus syndrome: Secondary | ICD-10-CM | POA: Diagnosis not present

## 2014-01-10 DIAGNOSIS — E119 Type 2 diabetes mellitus without complications: Secondary | ICD-10-CM | POA: Diagnosis not present

## 2014-01-10 DIAGNOSIS — G4730 Sleep apnea, unspecified: Secondary | ICD-10-CM | POA: Diagnosis not present

## 2014-01-10 LAB — GLUCOSE, CAPILLARY
Glucose-Capillary: 216 mg/dL — ABNORMAL HIGH (ref 70–99)
Glucose-Capillary: 192 mg/dL — ABNORMAL HIGH (ref 70–99)

## 2014-01-10 NOTE — Progress Notes (Signed)
Inpatient Diabetes Program Recommendations  AACE/ADA: New Consensus Statement on Inpatient Glycemic Control (2013)  Target Ranges:  Prepandial:   less than 140 mg/dL      Peak postprandial:   less than 180 mg/dL (1-2 hours)      Critically ill patients:  140 - 180 mg/dL   Reason for Assessment:  Results for MISSY, VANDERMOLEN (MRN [id number]) as of 01/10/2014 11:18  Ref. Range 01/09/2014 12:44 01/09/2014 17:07 01/09/2014 20:30 01/10/2014 07:38  Glucose-Capillary Latest Range: 70-99 mg/dL 164 (H) 215 (H) 252 (H) 216 (H)   Diabetes history: Type 2 diabetes Outpatient Diabetes medications: No home meds. listed. Current orders for Inpatient glycemic control:  Novolog sensitive tid with meals.  May consider increasing Novolog correction to moderate tid with meals.  Thanks, Adah Perl, RN, BC-ADM Inpatient Diabetes Coordinator Pager [phone]

## 2014-01-10 NOTE — Progress Notes (Signed)
Reviewed discharge instructions with patient and daughter, they stated their understanding.  Patient ambulated in hallway with walker independently to nurses desk.  Patient active at home with [facility] home care for home health nurse.  Hassan Rowan CM made aware.  Sanda Linger

## 2014-01-10 NOTE — Care Management Note (Signed)
    Page 1 of 1   01/10/2014     2:42:26 PM CARE MANAGEMENT NOTE 01/10/2014  Patient:  Debra Saunders, Debra Saunders   Account Number:  [id number]  Date Initiated:  01/10/2014  Documentation initiated by:  GRAVES-BIGELOW,Landra Howze  Subjective/Objective Assessment:   Pt admitted for Dual pacemaker implantation. Pt is active with [facility] for [facility] RN services.     Action/Plan:   CM did call [facility] and no additional orders needed for resumption.   Anticipated DC Date:  01/10/2014   Anticipated DC Plan:  [place]  CM consult      [facility] Choice  Resumption Of Svcs/PTA Provider   Choice offered to / List presented to:  C-1 Patient        [place] arranged  HH-1 RN  [place]   Status of service:  Completed, signed off Medicare Important Message given?  NO (If response is "NO", the following Medicare IM given date fields will be blank) Date Medicare IM given:   Medicare IM given by:   Date Additional Medicare IM given:   Additional Medicare IM given by:    Discharge Disposition:  [place]  Per UR Regulation:  Reviewed for med. necessity/level of care/duration of stay  If discussed at [place] of Stay Meetings, dates discussed:    Comments:  01-10-14 [address] [place], [place] [phone] CM did speak to liasion at [facility]. Pt will not need resumption orders. CM will fax d/c summary and face sheet to Home care office. No further needs from CM at this time.

## 2014-01-10 NOTE — Progress Notes (Signed)
Dual chamber pacemaker post op is normal. Atrial lead:  5.15mV, 475, 0.9V@0 .55ms Ventricular lead:  18.55mv, 793, 0.4V@0 .3ms  [employer] [phone]

## 2014-01-10 NOTE — Discharge Instructions (Signed)
Supplemental Discharge Instructions for  Pacemaker/Defibrillator Patients  Activity No heavy lifting or vigorous activity with your left/right arm for 6 to 8 weeks.  Do not raise your left/right arm above your head for one week.  Gradually raise your affected arm as drawn below.                       07/24                   07/25                          07/26                           07/27            NO DRIVING for  1 week    ; you may begin driving on     [id number]     . WOUND CARE   Keep the wound area clean and dry.  Do not get this area wet for one week. No showers for one week; you may shower on      07/28        .   The tape/steri-strips on your wound will fall off; do not pull them off.  No bandage is needed on the site.  DO  NOT apply any creams, oils, or ointments to the wound area.   If you notice any drainage or discharge from the wound, any swelling or bruising at the site, or you develop a fever > 101? F after you are discharged home, call the office at once.  Special Instructions   You are still able to use cellular telephones; use the ear opposite the side where you have your pacemaker/defibrillator.  Avoid carrying your cellular phone near your device.   When traveling through airports, show security personnel your identification card to avoid being screened in the metal detectors.  Ask the security personnel to use the hand wand.   Avoid arc welding equipment, MRI testing (magnetic resonance imaging), TENS units (transcutaneous nerve stimulators).  Call the office for questions about other devices.   Avoid electrical appliances that are in poor condition or are not properly grounded.   Microwave ovens are safe to be near or to operate.  Additional information for defibrillator patients should your device go off:   If your device goes off ONCE and you feel fine afterward, notify the device clinic nurses.   If your device goes off ONCE and you do not feel well afterward,  call 911.   If your device goes off TWICE, call 911.   If your device goes off THREE times in one day, call 911.  DO NOT DRIVE YOURSELF OR A FAMILY MEMBER WITH A DEFIBRILLATOR TO THE HOSPITAL--CALL 911.    Pacemaker Implantation The heart has its own electrical system, or natural pacemaker, to regulate the heart beat. Sometimes, the natural pacemaker system of the heart fails and causes the heart to beat too slowly. If this happens, a pacemaker can be surgically placed to help the heart beat at a normal or programmed rate. A pacemaker is a small, battery powered device that is placed under the skin. The pacemaker is programmed to sense and "fire" when the heart rate falls below a certain heart rate. When the pacemaker triggers a heart beat, it is called "capture." Parts of  a pacemaker include:  Wires or leads. The leads are placed in the heart and transmit electricity to the heart. The leads are connected to the pulse generator.  Pulse generator. The pulse generator contains a computer and a memory system. The pulse generator also produces the electrical signal that triggers the heart to beat. A PACEMAKER MAY BE PLACED IF:  You have a slow heart beat (bradycardia).  You have fainting (syncope).  Shortness of breath (dyspnea) due to heart problems. LET YOUR CAREGIVER KNOW ABOUT:  Allergies to all medicine, food, latex, tape, contrast dye or iodine.  All medicines taken including:  Prescription medicines.  Over-the-counter medicines.  Steroid medicine, including injections or creams applied to the skin.  Eyedrops.  Vitamins or herbs.  Health conditions such as heart, kidney, respiratory and diabetes problems.  History of bleeding problems or blood clots.  Problems with general anesthesia.  Previous surgery.  Possibility of being pregnant. RISKS AND COMPLICATIONS The risks, benefits and other treatment options will be discussed before the procedure. Complications are rare  but can include:  Bleeding.  Unable to place the pacemaker under local sedation.  Infection. BEFORE THE PROCEDURE  You will have blood work drawn before the procedure.  If you smoke, quit.  Do not eat or drink anything for 6 hours before the surgery or as told by your caregiver.  Ask if it is okay to take medicines with a sip of water in the morning of your procedure.  If you are diabetic, follow your caregiver's recommendation on taking insulin.  On average, it takes about an hour to place a pacemaker. PROCEDURE  The surgery to place a pacemaker is considered a minimally invasive surgical procedure. It is done under a local anesthetic, which is an injection at the incision site that makes the skin numb. You are also given sedation and pain medicine that makes you drowsy and forgetful during the procedure.   An intravenous line (IV) will be started in your hand or arm so sedation and pain medicine can be given during the pacemaker procedure.  A numbing medicine will be injected into the skin where the pacemaker will be placed. A small incision is then made into the skin. The pacemaker is usually placed under the skin near the collarbone.  After the incision is made, the leads are inserted into a large vein and are guided into the heart using X-ray.  Using the same incision to place the leads, a small pocket is created under the skin to hold the pulse generator. The leads are then connected to the pulse generator.  The incision site is then closed. A dressing is placed over the pacemaker site. The dressing is removed 24 to 48 hours afterwards. AFTER THE PROCEDURE  You will be taken to a recovery area after the pacemaker implant. Your vital signs such as blood pressure, heart rate, breathing and oxygen levels will be monitored.  A chest X-ray is done after the pacemaker is implanted. This is to make sure the pacemaker and leads are in the correct place.  Most but not all people  stay overnight in the hospital and go home the next day after a pacemaker implant. Document Released: 05/30/2002 Document Revised: 12/09/2011 Document Reviewed: 10/14/2011 [facility] Patient Information 2015 [place], [place]. This information is not intended to replace advice given to you by your health care provider. Make sure you discuss any questions you have with your health care provider.

## 2014-01-10 NOTE — Discharge Summary (Signed)
CARDIOLOGY DISCHARGE SUMMARY   Patient ID: Debra Saunders MRN: [id number] DOB/AGE: Jun 18, 1946 68 y.o.  Admit date: 01/09/2014 Discharge date: 01/10/2014  PCP: [name], MD Primary Cardiologist: Dr. Caryl Comes  Primary Discharge Diagnosis: Sinoatrial node dysfunction: [employer] pulse generator dual-lead pacemaker inserted  Secondary Discharge Diagnosis:  Diabetes Hypertension  Procedures: Dual pacemaker implantation, [employer] pulse generator serial number [id number]   Hospital Course: Debra Saunders is a 68 y.o. female with no history of CAD. She is on home oxygen and has known left ventricular EDP. The Holter monitor demonstrated daytime heart rates in the 30s. She was evaluated by Dr. Caryl Comes for shortness of breath, felt to be multifactorial. He felt that some of her symptoms could be ameliorated by dual-lead pacing. She was scheduled for this and came to the hospital for the procedure on 07/20.  Procedure results are below. She had a [employer] forces technical officer pacemaker inserted without complication. She was admitted overnight.  She was continued on her home medications plus sliding scale insulin. She did well overnight and her post procedure chest x-ray as well as her to device check were within normal limits.   On 07/21, she was seen by Dr. Caryl Comes and all data were reviewed. No further inpatient workup is indicated and she is considered stable for discharge, to follow up as an outpatient.  General: Well developed, well nourished, female in no acute distress Head: Eyes PERRLA, No xanthomas.   Normocephalic and atraumatic  Lungs: Clear bilaterally to auscultation. Heart: HRRR S1 S2, without MRG.  Pulses are 2+ & equal. No JVD.  wouund wtihout hematoma Abdomen: Bowel sounds are present, abdomen soft and non-tender without masses or  hernias noted. Msk: Normal strength and tone for age. Extremities: No clubbing, cyanosis or edema.    Skin:  No rashes  or lesions noted. Neuro: Alert and oriented X 3. Psych:  Good affect, responds appropriately   Labs:   Lab Results  Component Value Date   WBC 6.2 01/09/2014   HGB 11.7* 01/09/2014   HCT 37.2 01/09/2014   MCV 86.3 01/09/2014   PLT 223 01/09/2014     Recent Labs Lab 01/09/14 1000  NA 141  K 4.3  CL 99  CO2 30  BUN 19  CREATININE 0.64  CALCIUM 9.2  GLUCOSE 202*     Radiology: Dg Chest 2 View 01/10/2014   CLINICAL DATA:  Shortness of breath and weakness. Status post pacemaker placement.  EXAM: CHEST  2 VIEW  COMPARISON:  01/25/2013.  FINDINGS: The permanent left-sided pacemaker is in good position. The right atrial and ventricular wires are well position without complicating features. The cardiac silhouette, mediastinal and hilar contours are stable. There are chronic pulmonary scarring changes most notably in the right upper lobe. No pneumothorax, pleural effusion or acute infiltrate.  IMPRESSION: Pacer wires in good position without complicating features.  Chronic lung changes.   Electronically Signed   By: Kalman Jewels M.D.   On: 01/10/2014 08:15   Dual-lead pacemaker insertion: 01/09/2049 Procedure dual pacemaker implantation  After routine prep and drape, lidocaine was infiltrated in the prepectoral subclavicular region on the left side an incision was made and carried down to later the prepectoral fascia using electrocautery and sharp dissection a pocket was formed similarly. Hemostasis was obtained.  After this, we turned our attention to gaining accessm to the extrathoracic,left subclavian vein. This was accomplished without difficulty and without the aspiration of air or puncture of the artery. 2 separate  venipunctures were accomplished; guidewires were placed and retained and sequentially 7 French sheath through which were passed an Medtronic 5076 ventricular lead serial [id number] and an Medtronic 5076 atrial lead serial number [id number] .  The ventricular lead was  manipulated to the right ventricular apex with a bipolar R wave was 18.1, the pacing impedance was 1021, the threshold was 0.5 @ 0.5 msec Current at threshold was 0.5 Ma and the current of injury was BRISK.  The right atrial lead was manipulated to the right atrial appendage with a bipolar P-wave 4 the pacing impedance was 675he threshold 1.4.32msec Current at threshold was 2.5a and the current of injury was BRISK  The leads were affixed to the prepectoral fascia and attached to a [employer]  pulse generator serial number [id number]  Hemostasis was obtained. The pocket was copiously irrigated with antibiotic containing saline solution. The leads and the pulse generator were placed in the pocket and affixed to the prepectoral fascia. The wound was then closed in 2 layers in the normal fashion. The wound was washed dried . And a dermabond adhesive was applied  Needle Count, sponge counts and instrument counts were correct at the end of the procedure .  The patient tolerated the procedure without apparent complication.  EKG: 01/10/2014 Sinus rhythm, rate 64, nonspecific ST-T wave flattening  FOLLOW UP PLANS AND APPOINTMENTS Allergies  Allergen Reactions  . Other     Blood Thinners-per doctor at [facility]  . Penicillins Hives and Swelling     Medication List         acetaminophen 325 MG tablet  Commonly known as:  TYLENOL  Take 325 mg by mouth every 6 (six) hours as needed for pain.     amLODipine 5 MG tablet  Commonly known as:  NORVASC  Take 1 tablet (5 mg total) by mouth daily.     antiseptic oral rinse Liqd  15 mLs by Mouth Rinse route as needed for dry mouth.     BIOFREEZE EX  Apply 1 each topically as needed (for pain).     chlorthalidone 25 MG tablet  Commonly known as:  HYGROTON  Take 0.5 tablets (12.5 mg total) by mouth daily.     cyclobenzaprine 10 MG tablet  Commonly known as:  FLEXERIL  Take 1 tablet (10 mg total) by mouth 3 (three) times daily as needed for  muscle spasms.     furosemide 20 MG tablet  Commonly known as:  LASIX  Take 1 tablet (20 mg total) by mouth 2 (two) times daily.     GAS-X PO  Take 1 tablet by mouth as needed (for gas).     levETIRAcetam 500 MG tablet  Commonly known as:  KEPPRA  Take 1 tablet (500 mg total) by mouth 2 (two) times daily.     meclizine 50 MG tablet  Commonly known as:  ANTIVERT  Take 1 tablet (50 mg total) by mouth 2 (two) times daily as needed for dizziness.     naproxen 500 MG EC tablet  Commonly known as:  EC NAPROSYN  Take 1 tablet (500 mg total) by mouth 2 (two) times daily as needed.     NON FORMULARY  Oxygen 3 liters daily.     NON FORMULARY  CPAP     oxyCODONE-acetaminophen 5-325 MG per tablet  Commonly known as:  PERCOCET/ROXICET  Take 0.5 tablets by mouth every 8 (eight) hours as needed for severe pain.     polyethylene glycol packet  Commonly  known as:  MIRALAX / GLYCOLAX  Take 17 g by mouth daily as needed for moderate constipation.     potassium chloride SA 20 MEQ tablet  Commonly known as:  K-DUR,KLOR-CON  Take 1 tablet (20 mEq total) by mouth 2 (two) times daily.     promethazine 25 MG tablet  Commonly known as:  PHENERGAN  Take 25 mg by mouth every 4 (four) hours as needed for nausea.         Follow-up Information   Follow up with Martha On 01/19/2014.   Contact information:   [address] [place] [address]       Follow up with Virl Axe, MD On 04/13/2014. (at 10:30 am)    Specialty:  Cardiology   Contact information:   [id number] [place] [address] [phone]       BRING ALL MEDICATIONS WITH YOU TO FOLLOW UP APPOINTMENTS  Time spent with patient to include physician time: 38 min Signed: Rosaria Ferries, PA-C 01/10/2014, 10:38 AM Co-Sign MD

## 2014-01-12 ENCOUNTER — Ambulatory Visit: Payer: Medicare Other | Admitting: Internal Medicine

## 2014-01-13 ENCOUNTER — Ambulatory Visit (INDEPENDENT_AMBULATORY_CARE_PROVIDER_SITE_OTHER): Payer: Medicare Other | Admitting: Internal Medicine

## 2014-01-13 ENCOUNTER — Encounter: Payer: Self-pay | Admitting: Internal Medicine

## 2014-01-13 VITALS — BP 150/88 | HR 75 | Temp 98.3°F | Ht 62.0 in | Wt 227.0 lb

## 2014-01-13 DIAGNOSIS — E119 Type 2 diabetes mellitus without complications: Secondary | ICD-10-CM

## 2014-01-13 DIAGNOSIS — I495 Sick sinus syndrome: Secondary | ICD-10-CM

## 2014-01-13 DIAGNOSIS — R269 Unspecified abnormalities of gait and mobility: Secondary | ICD-10-CM | POA: Insufficient documentation

## 2014-01-13 LAB — HM DIABETES FOOT EXAM: HM Diabetic Foot Exam: NORMAL

## 2014-01-13 NOTE — Progress Notes (Signed)
Subjective:    Patient ID: Debra Saunders, female    DOB: Jan 01, 1946, 68 y.o.   MRN: [id number]  HPI 68YO female presents for follow up.  Pacemaker placed on Monday. Energy level has improved. Feeling less short of breath. Less sleepy during the day. Has been active, walking around home, which is huge improvement for her.  DM - Occasional BG near 200 after eating food such as cake. Otherwise, well controlled, less than 150 generally.  No concerns today.  Review of Systems  Constitutional: Negative for fever, chills, appetite change, fatigue and unexpected weight change.  Eyes: Negative for visual disturbance.  Respiratory: Positive for shortness of breath. Negative for cough.   Cardiovascular: Positive for chest pain (mild at site of pacemaker insertion) and leg swelling (chronic). Negative for palpitations.  Gastrointestinal: Negative for abdominal pain.  Skin: Negative for color change and rash.  Hematological: Negative for adenopathy. Does not bruise/bleed easily.  Psychiatric/Behavioral: Negative for sleep disturbance and dysphoric mood. The patient is not nervous/anxious.        Objective:    BP 150/88  Pulse 75  Temp(Src) 98.3 F (36.8 C) (Oral)  Ht 5\' 2"  (1.575 m)  Wt 227 lb (102.967 kg)  BMI 41.51 kg/m2  SpO2 96% Physical Exam  Constitutional: She is oriented to person, place, and time. She appears well-developed and well-nourished. No distress.  HENT:  Head: Normocephalic and atraumatic.  Right Ear: External ear normal.  Left Ear: External ear normal.  Nose: Nose normal.  Mouth/Throat: Oropharynx is clear and moist. No oropharyngeal exudate.  Eyes: Conjunctivae are normal. Pupils are equal, round, and reactive to light. Right eye exhibits no discharge. Left eye exhibits no discharge. No scleral icterus.  Neck: Normal range of motion. Neck supple. No tracheal deviation present. No thyromegaly present.  Cardiovascular: Normal rate, regular rhythm, normal heart  sounds and intact distal pulses.  Exam reveals no gallop and no friction rub.   No murmur heard. Pulmonary/Chest: Effort normal and breath sounds normal. No accessory muscle usage. Not tachypneic. No respiratory distress. She has no decreased breath sounds. She has no wheezes. She has no rhonchi. She has no rales. She exhibits no tenderness.    Musculoskeletal: Normal range of motion. She exhibits edema (left lower leg to knee, chronic). She exhibits no tenderness.  Lymphadenopathy:    She has no cervical adenopathy.  Neurological: She is alert and oriented to person, place, and time. No cranial nerve deficit. She exhibits normal muscle tone. Coordination normal.  Skin: Skin is warm and dry. No rash noted. She is not diaphoretic. No erythema. No pallor.  Psychiatric: She has a normal mood and affect. Her behavior is normal. Judgment and thought content normal.         Assessment & Plan:   Problem List Items Addressed This Visit     Unprioritized   Abnormality of gait     Some gait instability noted. Pt uses Isair Inabinet, but would benefit from rolling Saydi Kobel with a seat. Order placed.    Diabetes mellitus type 2, controlled      Lab Results  Component Value Date   HGBA1C 6.7* 10/06/2013   BG well controlled generally. Will plan to recheck A1c at follow up visit in 3 months. Foot exam normal today. Referral placed for eye exam.    Relevant Orders      Ambulatory referral to Ophthalmology   Sinoatrial node dysfunction - Primary     Significant improvement in fatigue after recent pacemaker  placement. Follow up with Dr. Caryl Comes as scheduled. 7/30.        Return in about 3 months (around 04/15/2014) for Recheck.

## 2014-01-13 NOTE — Assessment & Plan Note (Addendum)
Lab Results  Component Value Date   HGBA1C 6.7* 10/06/2013   BG well controlled generally. Will plan to recheck A1c at follow up visit in 3 months. Foot exam normal today. Referral placed for eye exam.

## 2014-01-13 NOTE — Patient Instructions (Signed)
Labs in 3 months.  We will set up a visit with your eye doctor and call you.

## 2014-01-13 NOTE — Assessment & Plan Note (Signed)
Significant improvement in fatigue after recent pacemaker placement. Follow up with Dr. Caryl Comes as scheduled. 7/30.

## 2014-01-13 NOTE — Progress Notes (Signed)
Pre visit review using our clinic review tool, if applicable. No additional management support is needed unless otherwise documented below in the visit note. 

## 2014-01-13 NOTE — Assessment & Plan Note (Signed)
Some gait instability noted. Pt uses Debra Saunders, but would benefit from rolling [name] with a seat. Order placed.

## 2014-01-14 ENCOUNTER — Encounter (HOSPITAL_COMMUNITY): Payer: Self-pay | Admitting: *Deleted

## 2014-01-19 ENCOUNTER — Ambulatory Visit (INDEPENDENT_AMBULATORY_CARE_PROVIDER_SITE_OTHER): Payer: Medicare Other | Admitting: *Deleted

## 2014-01-19 DIAGNOSIS — I498 Other specified cardiac arrhythmias: Secondary | ICD-10-CM

## 2014-01-19 DIAGNOSIS — I495 Sick sinus syndrome: Secondary | ICD-10-CM

## 2014-01-19 DIAGNOSIS — R001 Bradycardia, unspecified: Secondary | ICD-10-CM

## 2014-01-20 LAB — MDC_IDC_ENUM_SESS_TYPE_INCLINIC
Battery Remaining Longevity: 14
Brady Statistic RA Percent Paced: 44 %
Brady Statistic RV Percent Paced: 3 %
Implantable Pulse Generator Serial Number: 390330
Lead Channel Impedance Value: 471 Ohm
Lead Channel Impedance Value: 693 Ohm
Lead Channel Pacing Threshold Amplitude: 0.6 V
Lead Channel Pacing Threshold Amplitude: 0.7 V
Lead Channel Pacing Threshold Pulse Width: 0.4 ms
Lead Channel Pacing Threshold Pulse Width: 0.4 ms
Lead Channel Sensing Intrinsic Amplitude: 21.5 mV
Lead Channel Sensing Intrinsic Amplitude: 5.3 mV

## 2014-01-20 NOTE — Progress Notes (Signed)
Wound check appointment. Steri-strips removed. Wound without redness or edema. Incision edges approximated, wound well healed. Normal device function. Thresholds, sensing, and impedances consistent with implant measurements. Device programmed at 3.5V for extra safety margin until 3 month visit. Histogram distribution appropriate for patient and level of activity. 3 mode switches (<1%)---max dur. 1 min 30 sec---AF--unable to A/C---anemic. 9 high ventricular rates noted---max dur. 26 sec---AF/RVR. Patient educated about wound care, arm mobility, lifting restrictions. ROV in 3 months with SK.

## 2014-02-14 ENCOUNTER — Encounter: Payer: Self-pay | Admitting: Internal Medicine

## 2014-02-16 ENCOUNTER — Ambulatory Visit (INDEPENDENT_AMBULATORY_CARE_PROVIDER_SITE_OTHER): Payer: Medicare Other | Admitting: Cardiovascular Disease

## 2014-02-16 ENCOUNTER — Encounter: Payer: Self-pay | Admitting: Cardiovascular Disease

## 2014-02-16 VITALS — BP 125/80 | HR 69 | Ht 61.0 in | Wt 227.2 lb

## 2014-02-16 DIAGNOSIS — R001 Bradycardia, unspecified: Secondary | ICD-10-CM

## 2014-02-16 DIAGNOSIS — R0789 Other chest pain: Secondary | ICD-10-CM

## 2014-02-16 DIAGNOSIS — I498 Other specified cardiac arrhythmias: Secondary | ICD-10-CM

## 2014-02-16 DIAGNOSIS — I5032 Chronic diastolic (congestive) heart failure: Secondary | ICD-10-CM

## 2014-02-16 DIAGNOSIS — I495 Sick sinus syndrome: Secondary | ICD-10-CM

## 2014-02-16 DIAGNOSIS — R0602 Shortness of breath: Secondary | ICD-10-CM

## 2014-02-16 NOTE — Assessment & Plan Note (Signed)
He appears to be euvolemic. However, given worsening symptoms of dizziness and dyspnea, I requested an echocardiogram.

## 2014-02-16 NOTE — Patient Instructions (Addendum)
Your physician has requested that you have an echocardiogram. Echocardiography is a painless test that uses sound waves to create images of your heart. It provides your doctor with information about the size and shape of your heart and how well your heart's chambers and valves are working. This procedure takes approximately one hour. There are no restrictions for this procedure.  Your physician recommends that you schedule a follow-up appointment in:  3 months with Dr. Fletcher Anon    Your physician recommends that you schedule a follow-up appointment in:  With device clinic at 2pm tomorrow

## 2014-02-16 NOTE — Progress Notes (Signed)
Primary care physician: Dr. Ronette Deter  HPI  This is a 68 -year-old female who is here today for a followup visit.  She has known history of chronic diastolic heart failure with pulmonary hypertension, bradycardia and chronic atypical chest pain thought to be due to costochondritis. She has chronic left lower extremity swelling which has been evaluated in the past with venous duplex ultrasound on multiple occasions and was negative for DVT. She did have a nonocclusive DVT on ultrasound last year with subsequent IVC filter placement. She likely has lymphedema on the left side as well. Her echocardiogram in 2013 showed normal LV systolic function with mild diastolic dysfunction and evidence of elevated filling pressures. There was also moderate pulmonary hypertension with peak systolic pressure of 56 mm Hg. she underwent cardiac catheterization in August of 2013 which showed no evidence of obstructive coronary artery disease. She was noted to be severely hypertensive with significant elevation in left ventricular end-diastolic pressure.  She was hospitalized in July at [facility] for severe chest pain. Cardiac workup was overall negative. CT of the chest showed possible peripheral pulmonary emboli which really did not explain her symptoms. She was anticoagulated which was complicated by subarachnoid hemorrhage and cardiopulmonary arrest. She was transferred to [facility] and was treated conservatively. Anticoagulation was discontinued.[name] She was discharged home but readmitted in August at [facility] with septic shock and acute renal failure requiring brief dialysis. She ultimately improved. Renal function went back to normal.  She continued to have symptomatic bradycardia and underwent a dual-chamber pacemaker placement eye Dr. Caryl Comes in July. She had significant improvement in fatigue, dyspnea and dizziness after that. However, over the last week she started feeling worse again with similar symptoms to what she had before  pacemaker placement.  Allergies  Allergen Reactions  . Other     Blood Thinners-per doctor at [facility]  . Penicillins Hives and Swelling     Current Outpatient Prescriptions on File Prior to Visit  Medication Sig Dispense Refill  . acetaminophen (TYLENOL) 325 MG tablet Take 325 mg by mouth every 6 (six) hours as needed for pain.      [name] amLODipine (NORVASC) 5 MG tablet Take 1 tablet (5 mg total) by mouth daily.  30 tablet  6  . antiseptic oral rinse (BIOTENE) LIQD 15 mLs by Mouth Rinse route as needed for dry mouth.      . chlorthalidone (HYGROTON) 25 MG tablet Take 0.5 tablets (12.5 mg total) by mouth daily.  30 tablet  5  . cyclobenzaprine (FLEXERIL) 10 MG tablet Take 1 tablet (10 mg total) by mouth 3 (three) times daily as needed for muscle spasms.  60 tablet  3  . furosemide (LASIX) 20 MG tablet Take 1 tablet (20 mg total) by mouth 2 (two) times daily.  60 tablet  6  . levETIRAcetam (KEPPRA) 500 MG tablet Take 1 tablet (500 mg total) by mouth 2 (two) times daily.  60 tablet  0  . meclizine (ANTIVERT) 50 MG tablet Take 1 tablet (50 mg total) by mouth 2 (two) times daily as needed for dizziness.  30 tablet  0  . Menthol, Topical Analgesic, (BIOFREEZE EX) Apply 1 each topically as needed (for pain).      . naproxen (EC NAPROSYN) 500 MG EC tablet Take 1 tablet (500 mg total) by mouth 2 (two) times daily as needed.  60 tablet  3  . NON FORMULARY Oxygen 3 liters daily.      . NON FORMULARY CPAP      .  oxyCODONE-acetaminophen (PERCOCET/ROXICET) 5-325 MG per tablet Take 0.5 tablets by mouth every 8 (eight) hours as needed for severe pain.      . polyethylene glycol (MIRALAX / GLYCOLAX) packet Take 17 g by mouth daily as needed for moderate constipation.      . potassium chloride SA (K-DUR,KLOR-CON) 20 MEQ tablet Take 1 tablet (20 mEq total) by mouth 2 (two) times daily.  60 tablet  2  . promethazine (PHENERGAN) 25 MG tablet Take 25 mg by mouth every 4 (four) hours as needed for nausea.        . Simethicone (GAS-X PO) Take 1 tablet by mouth as needed (for gas).        No current facility-administered medications on file prior to visit.     Past Medical History  Diagnosis Date  . Thyroid disorder   . Diabetes mellitus     type II  . Thyroid nodule   . Sleep apnea   . Obesity   . Hypertension   . Hyperlipidemia   . Urinary incontinence   . CHF (congestive heart failure) 2005    Dx at [facility]  . Chronic back pain   . COPD (chronic obstructive pulmonary disease)     2L [place]  . Sleep apnea   . Lipoma of back   . Arthritis   . Streptococcal bacteremia   . Acute renal failure   . Stroke   . Sepsis with metabolic encephalopathy   . Gastritis   . Symptomatic bradycardia      Past Surgical History  Procedure Laterality Date  . Cholecystectomy    . Vaginal hysterectomy    . Cardiac catheterization  2013    @ [place]: No obstructive CAD: Only 20% ostial left circumflex.  . Abdominal hysterectomy  1969  . Cyst removal trunk  2002    BACK  . Lipoma removal  2014    back  . Pacemaker insertion  2015    [facility] dual chamber pacemaker implanted by Dr Caryl Comes for symptomatic bradycardia  . Insert / replace / remove pacemaker       Family History  Problem Relation Age of Onset  . Heart disease Mother   . Hypertension Mother   . COPD Mother     was a smoker  . Thyroid disease Mother   . Hypertension Father   . Hypertension Sister   . Thyroid disease Sister   . Hypertension Sister   . Thyroid disease Sister   . Breast cancer Maternal Aunt      History   Social History  . Marital Status: Divorced    Spouse Name: N/A    Number of Children: N/A  . Years of Education: N/A   Occupational History  . Retired- factory work     exposed to [place]  . Smoking status: Former Smoker -- 0.30 packs/day for 2 years    Types: Cigarettes    Quit date: 06/23/1990  . Smokeless tobacco: Not on file  . Alcohol Use: No  . Drug  Use: No  . Sexual Activity: Not on file   Other Topics Concern  . Not on file   Social History Narrative   Lives in [place] with daughter. Has 4 children. No pets.      Work - Restaurant manager, fast food, retired      Diet - regular       PHYSICAL EXAM   BP 125/80  Pulse 69  Ht 5\' 1"  (  1.549 m)  Wt 227 lb 4 oz (103.08 kg)  BMI 42.96 kg/m2  SpO2 96% Constitutional: She is oriented to person, place, and time. She appears well-developed and well-nourished. No distress.  HENT: No nasal discharge.  Head: Normocephalic and atraumatic.  Eyes: Pupils are equal and round. Right eye exhibits no discharge. Left eye exhibits no discharge.  Neck: Normal range of motion. Neck supple. No JVD present. No thyromegaly present.  Cardiovascular: Normal rate, regular rhythm, normal heart sounds. Exam reveals no gallop and no friction rub. No murmur heard.  Pulmonary/Chest: Effort normal and breath sounds normal. No stridor. No respiratory distress. She has no wheezes. She has no rales. She exhibits no tenderness.  Abdominal: Soft. Bowel sounds are normal. She exhibits no distension. There is no tenderness. There is no rebound and no guarding.  Musculoskeletal: Normal range of motion. She exhibits chronic left lower extremity edema and no tenderness.  Neurological: She is alert and oriented to person, place, and time. Coordination normal.  Skin: Skin is warm and dry. No rash noted. She is not diaphoretic. No erythema. No pallor.  Psychiatric: She has a normal mood and affect. Her behavior is normal. Judgment and thought content normal.   [id number] atrial pacemaker  -possibly demand type  Low voltage in precordial leads.   -  Nonspecific T-abnormality.   ABNORMAL    ASSESSMENT AND PLAN

## 2014-02-16 NOTE — Assessment & Plan Note (Signed)
She is status post permanent pacemaker placement initially with significant improvement in symptoms. However, she now reports worsening symptoms of dizziness, fatigue and shortness of breath. She reports having a heart rate of 52 beats per minute last week and she felt dizzy. From that current EKG, it appears that lower rate of pacing and 70 beats per minutes. He might be having premature beats. The patient will be getting device interrogation this week.

## 2014-02-17 ENCOUNTER — Encounter: Payer: Self-pay | Admitting: Internal Medicine

## 2014-02-17 ENCOUNTER — Ambulatory Visit (INDEPENDENT_AMBULATORY_CARE_PROVIDER_SITE_OTHER): Payer: Medicare Other | Admitting: *Deleted

## 2014-02-17 DIAGNOSIS — I498 Other specified cardiac arrhythmias: Secondary | ICD-10-CM

## 2014-02-17 DIAGNOSIS — R001 Bradycardia, unspecified: Secondary | ICD-10-CM

## 2014-02-17 LAB — MDC_IDC_ENUM_SESS_TYPE_INCLINIC
Battery Remaining Longevity: 127 mo
Brady Statistic RA Percent Paced: 35 %
Brady Statistic RV Percent Paced: 2 %
Date Time Interrogation Session: 20150828040000
Implantable Pulse Generator Serial Number: 390330
Lead Channel Impedance Value: 448 Ohm
Lead Channel Impedance Value: 829 Ohm
Lead Channel Pacing Threshold Amplitude: 0.4 V
Lead Channel Pacing Threshold Amplitude: 0.6 V
Lead Channel Pacing Threshold Pulse Width: 0.4 ms
Lead Channel Pacing Threshold Pulse Width: 0.4 ms
Lead Channel Sensing Intrinsic Amplitude: 25 mV
Lead Channel Sensing Intrinsic Amplitude: 5 mV
Lead Channel Setting Pacing Amplitude: 3.5 V
Lead Channel Setting Pacing Amplitude: 3.5 V
Lead Channel Setting Pacing Pulse Width: 0.4 ms
Lead Channel Setting Sensing Sensitivity: 2.5 mV
Zone Setting Detection Interval: 375 ms

## 2014-02-17 NOTE — Progress Notes (Signed)
PPM check in office. 

## 2014-02-20 ENCOUNTER — Telehealth: Payer: Self-pay | Admitting: *Deleted

## 2014-02-20 ENCOUNTER — Encounter: Payer: Self-pay | Admitting: Internal Medicine

## 2014-02-20 NOTE — Telephone Encounter (Signed)
Yes, please give order to continue treatment.

## 2014-02-20 NOTE — Telephone Encounter (Signed)
Olivia Mackie from [facility] called requesting orders to continue to see pt.  States she called several weeks ago requesting orders with no response (no notation of message).  she further states the pt has a cardiac monitor inher home that will be removed if orders to continue to treat is not received.  Please advise

## 2014-02-21 ENCOUNTER — Encounter: Payer: Self-pay | Admitting: *Deleted

## 2014-02-21 NOTE — Telephone Encounter (Signed)
Spoke with Debra Saunders, she states she needs a written order, [place] for cardiac management, medications, and safety in home.  She further states her certification is now up due to not having orders.  The fax number is [phone].  Letter has been drafted.

## 2014-02-23 ENCOUNTER — Telehealth: Payer: Self-pay | Admitting: *Deleted

## 2014-02-23 ENCOUNTER — Other Ambulatory Visit: Payer: Self-pay | Admitting: *Deleted

## 2014-02-23 DIAGNOSIS — I5032 Chronic diastolic (congestive) heart failure: Secondary | ICD-10-CM

## 2014-02-23 NOTE — Telephone Encounter (Signed)
Home health nurse called, pt needs a rollater walker, pt told the nurse that she requested Rx for this a while back but not sure what happened, she never received one.  Nurse asks for [place] to fax DME Rx to her at [phone] and she will have her supplier take care of this for the pt.

## 2014-02-23 NOTE — Telephone Encounter (Signed)
error 

## 2014-02-23 NOTE — Telephone Encounter (Signed)
Fine to send over order.

## 2014-02-24 ENCOUNTER — Telehealth: Payer: Self-pay | Admitting: *Deleted

## 2014-02-24 MED ORDER — MECLIZINE HCL 50 MG PO TABS: 50.0000 mg | ORAL_TABLET | Freq: Two times a day (BID) | ORAL | Status: DC | PRN

## 2014-02-24 NOTE — Telephone Encounter (Signed)
Pt requesting a refill of Meclizine

## 2014-02-24 NOTE — Telephone Encounter (Signed)
Fine to refill 

## 2014-03-02 ENCOUNTER — Other Ambulatory Visit: Payer: Self-pay

## 2014-03-02 ENCOUNTER — Encounter: Payer: Self-pay | Admitting: Internal Medicine

## 2014-03-02 ENCOUNTER — Other Ambulatory Visit (INDEPENDENT_AMBULATORY_CARE_PROVIDER_SITE_OTHER): Payer: Medicare Other

## 2014-03-02 DIAGNOSIS — I059 Rheumatic mitral valve disease, unspecified: Secondary | ICD-10-CM

## 2014-03-02 DIAGNOSIS — R0602 Shortness of breath: Secondary | ICD-10-CM

## 2014-03-03 ENCOUNTER — Encounter: Payer: Self-pay | Admitting: Internal Medicine

## 2014-03-14 ENCOUNTER — Other Ambulatory Visit: Payer: Self-pay | Admitting: Internal Medicine

## 2014-04-04 ENCOUNTER — Encounter: Payer: Self-pay | Admitting: Pulmonary Disease

## 2014-04-04 ENCOUNTER — Ambulatory Visit (INDEPENDENT_AMBULATORY_CARE_PROVIDER_SITE_OTHER): Payer: Medicare Other | Admitting: Pulmonary Disease

## 2014-04-04 VITALS — BP 126/66 | HR 82 | Ht 61.0 in | Wt 231.0 lb

## 2014-04-04 DIAGNOSIS — I270 Primary pulmonary hypertension: Secondary | ICD-10-CM

## 2014-04-04 DIAGNOSIS — M7989 Other specified soft tissue disorders: Secondary | ICD-10-CM

## 2014-04-04 DIAGNOSIS — R0602 Shortness of breath: Secondary | ICD-10-CM

## 2014-04-04 DIAGNOSIS — I272 Pulmonary hypertension, unspecified: Secondary | ICD-10-CM

## 2014-04-04 DIAGNOSIS — R600 Localized edema: Secondary | ICD-10-CM

## 2014-04-04 DIAGNOSIS — I5032 Chronic diastolic (congestive) heart failure: Secondary | ICD-10-CM

## 2014-04-04 DIAGNOSIS — G4733 Obstructive sleep apnea (adult) (pediatric): Secondary | ICD-10-CM

## 2014-04-04 NOTE — Progress Notes (Signed)
Subjective:    Patient ID: Debra Saunders, female    DOB: [birth year], 68 y.o.   MRN: [id number]  Synopsis: Debra Saunders is a very pleasant female with obstructive sleep apnea, obesity, diastolic dysfunction, and pulmonary hypertension who is referred to the [facility] pulmonary clinic in November 2013 for evaluation of pulmonary hypertension. In 2005 she established care with Dr. Thom Chimes at [facility] for evaluation of the same. Dr. Gaynell Face felt that her pulmonary hypertension was do to her congestive heart failure, obesity, and obstructive sleep apnea. Unfortunately she lost her insurance around 2008 and did not have regular pulmonary visits. During the interval from 2008 2013 her shortness of breath increased and she gained about 50 pounds. She had a left heart catheterization in 2013 which showed very high filling pressures of the left ventricle as well as high systemic pressures but normal coronary arteries. In November 2013 her simple spirometry was completely normal. In 2014 she had a subarachnoid bleed and cardiac arrest and amazingly survived.  She had an IVC filter placed for PE.  HPI  Chief Complaint  Patient presents with  . Follow-up    Pt c/o SOB and fatigue, L leg swelling.     04/04/2014 ROV > [place] had an EEG today because of having a lot of dizzy spells lately.  She states that the left leg has been pretty swollen lately, but she feels it has ben down more lately.  She has pain in the leg frequently and has badd throbbing pain in the left thigh which is worse at night.   Her breathing is unchanged, still some dyspna on exertion.  This initially improved immediately after the pacer was placed, but the dyspnea returned.  She says her tolerance is a little better.  She still has some mucus drainage in the mornings.   Past Medical History  Diagnosis Date  . Thyroid disorder   . Diabetes mellitus     type II  . Thyroid nodule   . Sleep apnea    . Obesity   . Hypertension   . Hyperlipidemia   . Urinary incontinence   . CHF (congestive heart failure) 2005    Dx at [facility]  . Chronic back pain   . COPD (chronic obstructive pulmonary disease)     2L [place]  . Sleep apnea   . Lipoma of back   . Arthritis   . Streptococcal bacteremia   . Acute renal failure   . Stroke   . Sepsis with metabolic encephalopathy   . Gastritis   . Symptomatic bradycardia      Review of Systems  Constitutional: Negative for fever, chills, activity change and fatigue.  HENT: Negative for congestion, postnasal drip and rhinorrhea.   Respiratory: Positive for shortness of breath. Negative for cough, chest tightness and wheezing.   Cardiovascular: Positive for leg swelling. Negative for chest pain and palpitations.       Objective:   Physical Exam  Filed Vitals:   04/04/14 1556  BP: 126/66  Pulse: 82  Height: 5\' 1"  (1.549 m)  Weight: 231 lb (104.781 kg)  SpO2: 100%  2.5L [place]  Gen: chronically ill appearing but in no acute distress HEENT: NCAT, PERRL, EOMi,  PULM: Crackles bases bilaterally CV: regular with PAC, no mgr, no JVD AB: BS+, soft, nontender, no hsm Ext: warm, persistent edema left leg, non-tender, no redness or warmth, no clubbing, no cyanosis  2004 [facility]: right heart catheterization from 10/25/02 revealed a PA  pressure of 62/25 (mean 36) and a mean PA pressure decreased remarkably to 20 during this catheterization. A PCWP was normal at 6 and a cardiac index of 2.42 L/m/m2 with an initial increase in vasodilator therapy to 2.71 L/m/m2 2004 6MW 313 meters (1,027 feet) On oxygen in 2004 04/22/2012 6 minute walk>> total distance walked 950 feet, heart rate ranged between 56 and 88, O2 saturation ranged between 91 and 98% on 2 L nasal cannula 11-26 Spirometry >> no obstruction, FEV1 1.42 L (70%) 11-26 6MW >> 795 Ft, O2 saturation 90% lowest on 2L 07/2012 6MW >> 1115 feet, O2 saturation 97% 2LNC     Assessment & Plan:   Chronic  diastolic heart failure Her most recent echocardiogram showed only diastolic dysfunction and the RVSP as to him and was lower from before, for what that's worth. At this point she appears euvolemic. However, I'm still puzzled as to why she has the chronic hypoxemic respiratory failure, see below.  Plan, -Continue treatment algorithm as outlined by Dr. Fletcher Anon  Pulmonary hypertension I think this is most likely WHO class II and 3 disease. No clear indication for a pulmonary vasodilator. Continue to maximize CHF and OSA treatment.  OSA (obstructive sleep apnea) Continue CPAP at 13 cm of water.  Chronic respiratory failure with hypoxia I still do not fully understand the etiology of her chronic hypoxemic respiratory failure. It may be some component of chronic heart failure with possibly underlying lung disease. Her spirometry testing in the past as never showed evidence of airflow obstruction or COPD.  A recent CT scan was worrisome for bronchiectasis and possibly interstitial lung disease.  Plan: -Obtain high-resolution CT chest -Continue oxygen 3 L continuously  Leg swelling I worry that she has chronic clot in the left leg. I'm going to get a ultrasound of her left leg to see if this is the case. Because she has some much pain in the left thigh it may be worthwhile to discuss surgical removal with vascular surgery or interventional radiology.    Updated Medication List Outpatient Encounter Prescriptions as of 04/04/2014  Medication Sig  . acetaminophen (TYLENOL) 325 MG tablet Take 325 mg by mouth every 6 (six) hours as needed for pain.  [name] amLODipine (NORVASC) 5 MG tablet Take 1 tablet (5 mg total) by mouth daily.  [name] antiseptic oral rinse (BIOTENE) LIQD 15 mLs by Mouth Rinse route as needed for dry mouth.  . chlorthalidone (HYGROTON) 25 MG tablet Take 0.5 tablets (12.5 mg total) by mouth daily.  . cyclobenzaprine (FLEXERIL) 10 MG tablet Take 1 tablet (10 mg total) by mouth 3 (three)  times daily as needed for muscle spasms.  . furosemide (LASIX) 20 MG tablet Take 1 tablet (20 mg total) by mouth 2 (two) times daily.  [name] levETIRAcetam (KEPPRA) 500 MG tablet Take 1 tablet (500 mg total) by mouth 2 (two) times daily.  . meclizine (ANTIVERT) 50 MG tablet Take 1 tablet (50 mg total) by mouth 2 (two) times daily as needed for dizziness.  . Menthol, Topical Analgesic, (BIOFREEZE EX) Apply 1 each topically as needed (for pain).  . naproxen (EC NAPROSYN) 500 MG EC tablet Take 1 tablet (500 mg total) by mouth 2 (two) times daily as needed.  . NON FORMULARY Oxygen 3 liters daily.  . NON FORMULARY CPAP  . oxyCODONE-acetaminophen (PERCOCET/ROXICET) 5-325 MG per tablet Take 0.5 tablets by mouth every 8 (eight) hours as needed for severe pain.  . polyethylene glycol (MIRALAX / GLYCOLAX) packet Take 17  g by mouth daily as needed for moderate constipation.  . potassium chloride SA (K-DUR,KLOR-CON) 20 MEQ tablet TAKE ONE TABLET BY MOUTH TWICE DAILY  . promethazine (PHENERGAN) 25 MG tablet Take 25 mg by mouth every 4 (four) hours as needed for nausea.  . Simethicone (GAS-X PO) Take 1 tablet by mouth as needed (for gas).

## 2014-04-04 NOTE — Assessment & Plan Note (Signed)
Continue CPAP at 13 cm of water.

## 2014-04-04 NOTE — Assessment & Plan Note (Signed)
I worry that she has chronic clot in the left leg. I'm going to get a ultrasound of her left leg to see if this is the case. Because she has some much pain in the left thigh it may be worthwhile to discuss surgical removal with vascular surgery or interventional radiology.

## 2014-04-04 NOTE — Patient Instructions (Signed)
We will order a CT scan of your chest and a lower extremity ultrasound We will see you back in 4 weeks or sooner if needed

## 2014-04-04 NOTE — Assessment & Plan Note (Signed)
I still do not fully understand the etiology of her chronic hypoxemic respiratory failure. It may be some component of chronic heart failure with possibly underlying lung disease. Her spirometry testing in the past as never showed evidence of airflow obstruction or COPD.  A recent CT scan was worrisome for bronchiectasis and possibly interstitial lung disease.  Plan: -Obtain high-resolution CT chest -Continue oxygen 3 L continuously

## 2014-04-04 NOTE — Assessment & Plan Note (Signed)
Her most recent echocardiogram showed only diastolic dysfunction and the RVSP as to him and was lower from before, for what that's worth. At this point she appears euvolemic. However, I'm still puzzled as to why she has the chronic hypoxemic respiratory failure, see below.  Plan, -Continue treatment algorithm as outlined by Dr. Fletcher Anon

## 2014-04-04 NOTE — Assessment & Plan Note (Signed)
I think this is most likely WHO class II and 3 disease. No clear indication for a pulmonary vasodilator. Continue to maximize CHF and OSA treatment.

## 2014-04-05 ENCOUNTER — Ambulatory Visit: Payer: Self-pay | Admitting: Pulmonary Disease

## 2014-04-06 ENCOUNTER — Encounter: Payer: Self-pay | Admitting: Pulmonary Disease

## 2014-04-06 DIAGNOSIS — J849 Interstitial pulmonary disease, unspecified: Secondary | ICD-10-CM | POA: Insufficient documentation

## 2014-04-07 ENCOUNTER — Telehealth: Payer: Self-pay

## 2014-04-07 NOTE — Telephone Encounter (Signed)
A,  Please let her know that her CT showed some scarring in her lungs and we will discuss more in clinic.  Thanks  B   A,  Please let her know that she has a clot in her vein, but it is not occluding blood flow so there she would not benefit from having it removed right now.  Thanks  B  ----------------------------------------------  ATC X2, line rang several times and then went dead, no option to leave vm.  WCB.

## 2014-04-08 ENCOUNTER — Other Ambulatory Visit: Payer: Self-pay | Admitting: Internal Medicine

## 2014-04-10 ENCOUNTER — Telehealth: Payer: Self-pay | Admitting: Internal Medicine

## 2014-04-10 NOTE — Telephone Encounter (Signed)
Debra Saunders called wanting to come in before her Nov appt. She's been having vaginal swelling, soreness, puss, bumps, and bleeding. She also said it's very painful to be submerged in water and to sit. Please call the patient.  Pt ph#  [phone] Thank you.

## 2014-04-10 NOTE — Telephone Encounter (Signed)
Sent to scheduling to be scheduled

## 2014-04-11 ENCOUNTER — Ambulatory Visit (INDEPENDENT_AMBULATORY_CARE_PROVIDER_SITE_OTHER): Payer: Medicare Other | Admitting: Internal Medicine

## 2014-04-11 ENCOUNTER — Encounter: Payer: Self-pay | Admitting: Internal Medicine

## 2014-04-11 VITALS — BP 142/78 | HR 64 | Temp 97.9°F | Ht 62.0 in | Wt 230.5 lb

## 2014-04-11 VITALS — BP 128/72 | HR 73 | Ht 65.0 in | Wt 230.5 lb

## 2014-04-11 DIAGNOSIS — I471 Supraventricular tachycardia: Secondary | ICD-10-CM

## 2014-04-11 DIAGNOSIS — Z45018 Encounter for adjustment and management of other part of cardiac pacemaker: Secondary | ICD-10-CM

## 2014-04-11 DIAGNOSIS — L03314 Cellulitis of groin: Secondary | ICD-10-CM

## 2014-04-11 DIAGNOSIS — I10 Essential (primary) hypertension: Secondary | ICD-10-CM

## 2014-04-11 DIAGNOSIS — R001 Bradycardia, unspecified: Secondary | ICD-10-CM

## 2014-04-11 DIAGNOSIS — R0789 Other chest pain: Secondary | ICD-10-CM

## 2014-04-11 LAB — CBC
HCT: 37.8 % (ref 36.0–46.0)
Hemoglobin: 11.7 g/dL — ABNORMAL LOW (ref 12.0–15.0)
MCHC: 31.1 g/dL (ref 30.0–36.0)
MCV: 84.9 fl (ref 78.0–100.0)
Platelets: 219 10*3/uL (ref 150.0–400.0)
RBC: 4.45 Mil/uL (ref 3.87–5.11)
RDW: 13.5 % (ref 11.5–15.5)
WBC: 6.9 10*3/uL (ref 4.0–10.5)

## 2014-04-11 LAB — MDC_IDC_ENUM_SESS_TYPE_INCLINIC
Battery Remaining Longevity: 162 mo
Brady Statistic RA Percent Paced: 38 %
Brady Statistic RV Percent Paced: 2 %
Date Time Interrogation Session: 20151020040000
Implantable Pulse Generator Serial Number: 390330
Lead Channel Impedance Value: 506 Ohm
Lead Channel Impedance Value: 811 Ohm
Lead Channel Pacing Threshold Amplitude: 0.2 V
Lead Channel Pacing Threshold Amplitude: 0.4 V
Lead Channel Pacing Threshold Pulse Width: 0.4 ms
Lead Channel Pacing Threshold Pulse Width: 0.4 ms
Lead Channel Sensing Intrinsic Amplitude: 20.5 mV
Lead Channel Sensing Intrinsic Amplitude: 4.7 mV
Lead Channel Setting Pacing Amplitude: 2 V
Lead Channel Setting Pacing Amplitude: 2.4 V
Lead Channel Setting Pacing Pulse Width: 0.4 ms
Lead Channel Setting Sensing Sensitivity: 2.5 mV
Zone Setting Detection Interval: 375 ms

## 2014-04-11 LAB — COMPREHENSIVE METABOLIC PANEL
ALT: 34 U/L (ref 0–35)
AST: 46 U/L — ABNORMAL HIGH (ref 0–37)
Albumin: 3.3 g/dL — ABNORMAL LOW (ref 3.5–5.2)
Alkaline Phosphatase: 115 U/L (ref 39–117)
BUN: 21 mg/dL (ref 6–23)
CO2: 31 mEq/L (ref 19–32)
Calcium: 9.5 mg/dL (ref 8.4–10.5)
Chloride: 96 mEq/L (ref 96–112)
Creatinine, Ser: 0.8 mg/dL (ref 0.4–1.2)
GFR: 90.99 mL/min (ref >60.00)
Glucose, Bld: 244 mg/dL — ABNORMAL HIGH (ref 70–99)
Potassium: 3.3 mEq/L — ABNORMAL LOW (ref 3.5–5.1)
Sodium: 135 mEq/L (ref 135–145)
Total Bilirubin: 0.6 mg/dL (ref 0.2–1.2)
Total Protein: 8.1 g/dL (ref 6.0–8.3)

## 2014-04-11 MED ORDER — CHLORTHALIDONE 25 MG PO TABS: 12.5000 mg | ORAL_TABLET | Freq: Every day | ORAL | Status: DC

## 2014-04-11 MED ORDER — LEVOFLOXACIN 500 MG PO TABS: 500.0000 mg | ORAL_TABLET | Freq: Every day | ORAL | Status: DC

## 2014-04-11 MED ORDER — VERAPAMIL HCL ER 120 MG PO TBCR: 120.0000 mg | EXTENDED_RELEASE_TABLET | Freq: Every day | ORAL | Status: DC

## 2014-04-11 MED ORDER — AMLODIPINE BESYLATE 5 MG PO TABS: 5.0000 mg | ORAL_TABLET | Freq: Every day | ORAL | Status: DC

## 2014-04-11 MED ORDER — POTASSIUM CHLORIDE CRYS ER 20 MEQ PO TBCR: 20.0000 meq | EXTENDED_RELEASE_TABLET | Freq: Two times a day (BID) | ORAL | Status: DC

## 2014-04-11 NOTE — Progress Notes (Signed)
Subjective:    Patient ID: Debra Saunders, female    DOB: 10/30/1945, 68 y.o.   MRN: [id number]  HPI 68YO female presents for an acute visit.  Vaginal swelling and bumps - Noticed first on Saturday. Bumps are itchy and painful. Hurts to urinate. Cleaning area with soap is painful. Tried using topical butt paste with no improvement. Fever Friday and Saturday near 101F.   Review of Systems  Constitutional: Positive for fever and fatigue. Negative for chills, appetite change and unexpected weight change.  Eyes: Negative for visual disturbance.  Respiratory: Negative for shortness of breath.   Cardiovascular: Negative for chest pain and leg swelling.  Gastrointestinal: Negative for abdominal pain.  Skin: Positive for color change, rash and wound.  Hematological: Negative for adenopathy. Does not bruise/bleed easily.  Psychiatric/Behavioral: Negative for dysphoric mood. The patient is not nervous/anxious.        Objective:    BP 142/78  Pulse 64  Temp(Src) 97.9 F (36.6 C) (Oral)  Ht 5' 2" (1.575 m)  Wt 230 lb 8 oz (104.554 kg)  BMI 42.15 kg/m2  SpO2 97% Physical Exam  Constitutional: She is oriented to person, place, and time. She appears well-developed and well-nourished. No distress.  HENT:  Head: Normocephalic and atraumatic.  Right Ear: External ear normal.  Left Ear: External ear normal.  Nose: Nose normal.  Mouth/Throat: Oropharynx is clear and moist.  Eyes: Conjunctivae are normal. Pupils are equal, round, and reactive to light. Right eye exhibits no discharge. Left eye exhibits no discharge. No scleral icterus.  Neck: Normal range of motion. Neck supple. No tracheal deviation present. No thyromegaly present.  Pulmonary/Chest: Effort normal. No accessory muscle usage. Not tachypneic. She has no decreased breath sounds. She has no rhonchi.  Genitourinary:     Musculoskeletal: Normal range of motion. She exhibits no edema and no tenderness.  Lymphadenopathy:   She has no cervical adenopathy.  Neurological: She is alert and oriented to person, place, and time. No cranial nerve deficit. She exhibits normal muscle tone. Coordination normal.  Skin: Skin is warm and dry. Rash (groin as described.) noted. Rash is pustular. She is not diaphoretic. There is erythema. No pallor.  Psychiatric: She has a normal mood and affect. Her behavior is normal. Judgment and thought content normal.          Assessment & Plan:   Problem List Items Addressed This Visit     Unprioritized   Cellulitis of groin - Primary     Induration over groin consistent with cellulitis. No obvious pustules or fluctuance. Will start empiric Levaquin. Will also set up GYN evaluation. Question if US of area might help identify any deeper fluid collections.    Relevant Medications      levofloxacin (LEVAQUIN) tablet   Other Relevant Orders      Ambulatory referral to Gynecology      Comp Met (CMET)      CBC       Return in about 2 weeks (around 04/25/2014).   

## 2014-04-11 NOTE — Patient Instructions (Addendum)
Start Levaquin daily to help treat infection in groin.  We will set up an evaluation with gynecology.  Please call our clinic immediately if any recurrent fever or worsening pain.  Follow up here in 2 weeks.

## 2014-04-11 NOTE — Assessment & Plan Note (Signed)
Induration over groin consistent with cellulitis. No obvious pustules or fluctuance. Will start empiric Levaquin. Will also set up GYN evaluation. Question if [place] of area might help identify any deeper fluid collections.

## 2014-04-11 NOTE — Progress Notes (Signed)
Patient Care Team: Jackolyn Confer, MD as PCP - General (Internal Medicine) Thom Chimes, MD as Referring Physician (Allergy) Juanito Doom, MD as Referring Physician (Internal Medicine)   HPI  Debra Saunders is a 68 y.o. 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   She says she is much better wth les dspnea      She has known left ventricular elevated diastolic pressures about 10 years ago. There has been interval 50 pound weight gain which amounts to about 20% of her body weight. She is now using oxygen all the time and is limited to about 100 -150 feet.   She has significant asymmetric edema. She has a history of blood clot in her left leg. She history of intracranial hemorrhage while on anticoagulation and is no longer considered a candidate for same  a filter was placed for her DVT   CT scan is reviewed from [facility] June 2015 demonstrated no pulmonary clots. Past Medical History  Diagnosis Date  . Thyroid disorder   . Diabetes mellitus     type II  . Thyroid nodule   . Sleep apnea   . Obesity   . Hypertension   . Hyperlipidemia   . Urinary incontinence   . CHF (congestive heart failure) 2005    Dx at [facility]  . Chronic back pain   . COPD (chronic obstructive pulmonary disease)     2L [place]  . Sleep apnea   . Lipoma of back   . Arthritis   . Streptococcal bacteremia   . Acute renal failure   . Stroke   . Sepsis with metabolic encephalopathy   . Gastritis   . Symptomatic bradycardia     Past Surgical History  Procedure Laterality Date  . Cholecystectomy    . Vaginal hysterectomy    . Cardiac catheterization  2013    @ [facility]: No obstructive CAD: Only 20% ostial left circumflex.  . Abdominal hysterectomy  1969  . Cyst removal trunk  2002    BACK  . Lipoma removal  2014    back  . Pacemaker insertion  2015    Snyder dual chamber pacemaker implanted by Dr Caryl Comes for symptomatic  bradycardia  . Insert / replace / remove pacemaker      Current Outpatient Prescriptions  Medication Sig Dispense Refill  . acetaminophen (TYLENOL) 325 MG tablet Take 325 mg by mouth every 6 (six) hours as needed for pain.      [name] amLODipine (NORVASC) 5 MG tablet Take 1 tablet (5 mg total) by mouth daily.  30 tablet  11  . antiseptic oral rinse (BIOTENE) LIQD 15 mLs by Mouth Rinse route as needed for dry mouth.      . chlorthalidone (HYGROTON) 25 MG tablet Take 0.5 tablets (12.5 mg total) by mouth daily.  30 tablet  5  . cyclobenzaprine (FLEXERIL) 10 MG tablet Take 1 tablet (10 mg total) by mouth 3 (three) times daily as needed for muscle spasms.  60 tablet  3  . furosemide (LASIX) 20 MG tablet Take 1 tablet (20 mg total) by mouth 2 (two) times daily.  60 tablet  6  . levETIRAcetam (KEPPRA) 500 MG tablet Take 1 tablet (500 mg total) by mouth 2 (two) times daily.  60 tablet  0  . levofloxacin (LEVAQUIN) 500 MG tablet Take 1 tablet (500 mg total) by mouth daily.  7 tablet  0  . meclizine (  ANTIVERT) 50 MG tablet Take 1 tablet (50 mg total) by mouth 2 (two) times daily as needed for dizziness.  30 tablet  0  . Menthol, Topical Analgesic, (BIOFREEZE EX) Apply 1 each topically as needed (for pain).      . naproxen (EC NAPROSYN) 500 MG EC tablet Take 1 tablet (500 mg total) by mouth 2 (two) times daily as needed.  60 tablet  3  . NON FORMULARY Oxygen 3 liters daily.      . NON FORMULARY CPAP      . oxyCODONE-acetaminophen (PERCOCET/ROXICET) 5-325 MG per tablet Take 0.5 tablets by mouth every 8 (eight) hours as needed for severe pain.      . polyethylene glycol (MIRALAX / GLYCOLAX) packet Take 17 g by mouth daily as needed for moderate constipation.      . potassium chloride SA (KLOR-CON M20) 20 MEQ tablet Take 1 tablet (20 mEq total) by mouth 2 (two) times daily.  60 tablet  6  . promethazine (PHENERGAN) 25 MG tablet Take 25 mg by mouth every 4 (four) hours as needed for nausea.      . Simethicone  (GAS-X PO) Take 1 tablet by mouth as needed (for gas).        No current facility-administered medications for this visit.    Allergies  Allergen Reactions  . Other     Blood Thinners-per doctor at [facility]  . Penicillins Hives and Swelling    Review of Systems negative except from HPI and PMH  Physical Exam BP 128/72  Pulse 73  Ht 5\' 5"  (1.651 m)  Wt 230 lb 8 oz (104.554 kg)  BMI 38.36 kg/m2 Well developed and well nourished in no acute distress wearing oxygen HENT normal E scleral and icterus clear Neck Supple Device pocket well healed; without hematoma or erythema.  There is no tethering Clear to ausculation Irregular  rate and rhythm, 2/6 systolic murmur Soft with active bowel sounds No clubbing cyanosis  Edema Alert and oriented, grossly normal motor and sensory function Skin Warm and Dry  ECG demonstrates sinus rhythm at 54 Interval 17/09/35 Nonspecific ST changes     Assessment and  Plan  Chronotropic incompetence  Sinus bradycardia  Atrial tachycardia  Dyspnea on exertion  Secondary pulmonary hypertension  HFpEF  Hypertension    She has significant functional improvement. This is a little surprising given that most of her pacing is that her lower rate limit. She also has about 15-20% of her beats faster than 100 suggesting atrial tachycardia. We'll discontinue her amlodipine and put her on verapamil. Her pulmonary hypertension is felt to be secondary to the we will get into trouble with this.  Patient was reprogrammed

## 2014-04-11 NOTE — Patient Instructions (Addendum)
Your physician has recommended you make the following change in your medication:  Stop Norvasc  Start Verapamil 120 mg once daily   Your physician recommends that you schedule a follow-up appointment in:  3 months with Dr. Caryl Comes   Your next appointment will be scheduled in our new office located at :  [facility]  [address] [place], [place] [address]   Remote monitoring is used to monitor your Pacemaker of ICD from home. This monitoring reduces the number of office visits required to check your device to one time per year. It allows [place] to keep an eye on the functioning of your device to ensure it is working properly. You are scheduled for a device check from home on 07/12/14. You may send your transmission at any time that day. If you have a wireless device, the transmission will be sent automatically. After your physician reviews your transmission, you will receive a postcard with your next transmission date.

## 2014-04-11 NOTE — Progress Notes (Signed)
Pre visit review using our clinic review tool, if applicable. No additional management support is needed unless otherwise documented below in the visit note. 

## 2014-04-12 NOTE — Telephone Encounter (Signed)
Pt aware of results and recs.  Nothing further needed at this time. 

## 2014-04-13 ENCOUNTER — Encounter: Payer: Medicare Other | Admitting: Internal Medicine

## 2014-04-13 ENCOUNTER — Encounter: Payer: Self-pay | Admitting: Pulmonary Disease

## 2014-04-19 ENCOUNTER — Encounter: Payer: Self-pay | Admitting: Internal Medicine

## 2014-04-19 ENCOUNTER — Ambulatory Visit: Payer: Medicare Other | Admitting: Internal Medicine

## 2014-04-24 ENCOUNTER — Encounter: Payer: Self-pay | Admitting: Internal Medicine

## 2014-05-01 ENCOUNTER — Encounter: Payer: Self-pay | Admitting: Internal Medicine

## 2014-05-01 ENCOUNTER — Ambulatory Visit (INDEPENDENT_AMBULATORY_CARE_PROVIDER_SITE_OTHER): Payer: Medicare Other | Admitting: Internal Medicine

## 2014-05-01 ENCOUNTER — Ambulatory Visit (INDEPENDENT_AMBULATORY_CARE_PROVIDER_SITE_OTHER): Payer: Medicare Other | Admitting: *Deleted

## 2014-05-01 VITALS — BP 150/88 | HR 62 | Temp 98.2°F | Ht 62.0 in | Wt 231.5 lb

## 2014-05-01 DIAGNOSIS — E119 Type 2 diabetes mellitus without complications: Secondary | ICD-10-CM

## 2014-05-01 DIAGNOSIS — I5032 Chronic diastolic (congestive) heart failure: Secondary | ICD-10-CM

## 2014-05-01 DIAGNOSIS — R0789 Other chest pain: Secondary | ICD-10-CM

## 2014-05-01 DIAGNOSIS — I10 Essential (primary) hypertension: Secondary | ICD-10-CM

## 2014-05-01 DIAGNOSIS — L03314 Cellulitis of groin: Secondary | ICD-10-CM

## 2014-05-01 DIAGNOSIS — Z23 Encounter for immunization: Secondary | ICD-10-CM

## 2014-05-01 LAB — COMPREHENSIVE METABOLIC PANEL
ALT: 41 U/L — ABNORMAL HIGH (ref 0–35)
AST: 46 U/L — ABNORMAL HIGH (ref 0–37)
Albumin: 3.2 g/dL — ABNORMAL LOW (ref 3.5–5.2)
Alkaline Phosphatase: 108 U/L (ref 39–117)
BUN: 15 mg/dL (ref 6–23)
CO2: 26 mEq/L (ref 19–32)
Calcium: 9.1 mg/dL (ref 8.4–10.5)
Chloride: 99 mEq/L (ref 96–112)
Creatinine, Ser: 0.8 mg/dL (ref 0.4–1.2)
GFR: 96.53 mL/min (ref >60.00)
Glucose, Bld: 217 mg/dL — ABNORMAL HIGH (ref 70–99)
Potassium: 3.6 mEq/L (ref 3.5–5.1)
Sodium: 137 mEq/L (ref 135–145)
Total Bilirubin: 0.5 mg/dL (ref 0.2–1.2)
Total Protein: 8 g/dL (ref 6.0–8.3)

## 2014-05-01 LAB — MICROALBUMIN / CREATININE URINE RATIO
Creatinine,U: 177.9 mg/dL
Microalb Creat Ratio: 1 mg/g (ref 0.0–30.0)
Microalb, Ur: 1.7 mg/dL (ref 0.0–1.9)

## 2014-05-01 LAB — HEMOGLOBIN A1C: Hgb A1c MFr Bld: 8.4 % — ABNORMAL HIGH (ref 4.6–6.5)

## 2014-05-01 LAB — LIPID PANEL
Cholesterol: 174 mg/dL (ref 0–200)
HDL: 50.6 mg/dL (ref >39.00)
LDL Cholesterol: 94 mg/dL (ref 0–99)
NonHDL: 123.4
Total CHOL/HDL Ratio: 3
Triglycerides: 149 mg/dL (ref 0.0–149.0)
VLDL: 29.8 mg/dL (ref 0.0–40.0)

## 2014-05-01 LAB — BRAIN NATRIURETIC PEPTIDE: Pro B Natriuretic peptide (BNP): 44 pg/mL (ref 0.0–100.0)

## 2014-05-01 LAB — TSH: TSH: 1.22 u[IU]/mL (ref 0.35–4.50)

## 2014-05-01 MED ORDER — NYSTATIN 100000 UNIT/GM EX POWD: CUTANEOUS | Status: DC

## 2014-05-01 NOTE — Assessment & Plan Note (Signed)
Will check A1c with labs today. If elevated, will consider stating Glimepiride.

## 2014-05-01 NOTE — Patient Instructions (Signed)
Labs today.  Flu shot today.

## 2014-05-01 NOTE — Progress Notes (Signed)
Pre visit review using our clinic review tool, if applicable. No additional management support is needed unless otherwise documented below in the visit note. 

## 2014-05-01 NOTE — Assessment & Plan Note (Signed)
Appears euvolemic on exam today. Will check BNP with labs given she is more dyspneic.

## 2014-05-01 NOTE — Assessment & Plan Note (Signed)
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 Encouraged her to use prn Percocet for severe pain. Reviewed recent CT scan with her and her daughter.

## 2014-05-01 NOTE — Progress Notes (Signed)
Subjective:    Patient ID: Debra Saunders, female    DOB: 27-May-1946, 68 y.o.   MRN: [id number]  HPI 68YO female presents for follow up.  Last visit in 03/2014 was seen for cellulitis of the groin. Started on Levaquin and evaluation set up with GYN. Completed antibiotics. Was seen by Dr. Marcelline Mates and started on topical hydrocortisone and nystatin powder.  DM - BG have been elevated near 200. Has not been taking Metformin.  Has chronically felt some shortness of breath both at rest and on exertion. Seems worse over last few weeks. Daughter feels she is puffy. Weight has been stable. No pitting edema in arms or legs. Compliant with medications including furosemide. Recent CT chest 03/2014, reviewed today showed no major changes. Also has some chest pain at site of previous chest compressions and chronic costchondritis.   Review of Systems  Constitutional: Negative for fever, chills, appetite change, fatigue and unexpected weight change.  Eyes: Negative for visual disturbance.  Respiratory: Positive for shortness of breath.   Cardiovascular: Positive for chest pain. Negative for leg swelling.  Gastrointestinal: Negative for nausea, vomiting, abdominal pain, diarrhea and constipation.  Musculoskeletal: Negative for myalgias and arthralgias.  Skin: Negative for color change and rash.  Hematological: Negative for adenopathy. Does not bruise/bleed easily.  Psychiatric/Behavioral: Negative for dysphoric mood. The patient is not nervous/anxious.        Objective:    BP 150/88 mmHg  Pulse 62  Temp(Src) 98.2 F (36.8 C) (Oral)  Ht 5\' 2"  (1.575 m)  Wt 231 lb 8 oz (105.008 kg)  BMI 42.33 kg/m2  SpO2 96% Physical Exam  Constitutional: She is oriented to person, place, and time. She appears well-developed and well-nourished. No distress.  HENT:  Head: Normocephalic and atraumatic.  Right Ear: External ear normal.  Left Ear: External ear normal.  Nose: Nose normal.  Mouth/Throat:  Oropharynx is clear and moist. No oropharyngeal exudate.  Eyes: Conjunctivae are normal. Pupils are equal, round, and reactive to light. Right eye exhibits no discharge. Left eye exhibits no discharge. No scleral icterus.  Neck: Normal range of motion. Neck supple. No tracheal deviation present. No thyromegaly present.  Cardiovascular: Normal rate, regular rhythm, normal heart sounds and intact distal pulses.  Exam reveals no gallop and no friction rub.   No murmur heard. Pulmonary/Chest: Effort normal and breath sounds normal. No accessory muscle usage. No tachypnea. No respiratory distress. She has no decreased breath sounds. She has no wheezes. She has no rhonchi. She has no rales. She exhibits tenderness.    Musculoskeletal: Normal range of motion. She exhibits no edema or tenderness.  Lymphadenopathy:    She has no cervical adenopathy.  Neurological: She is alert and oriented to person, place, and time. No cranial nerve deficit. She exhibits normal muscle tone. Coordination normal.  Skin: Skin is warm and dry. No rash noted. She is not diaphoretic. No erythema. No pallor.  Psychiatric: She has a normal mood and affect. Her behavior is normal. Judgment and thought content normal.          Assessment & Plan:   Problem List Items Addressed This Visit      Unprioritized   Cellulitis of groin   Chronic diastolic heart failure    Appears euvolemic on exam today. Will check BNP with labs given she is more dyspneic.     Relevant Orders      B Nat Peptide      TSH   Diabetes mellitus type 2,  controlled - Primary    Will check A1c with labs today. If elevated, will consider stating Glimepiride.    Relevant Orders      Comprehensive metabolic panel      Hemoglobin A1c      Lipid panel      Microalbumin / creatinine urine ratio   Hypertension    BP Readings from Last 3 Encounters:  05/01/14 150/88  04/11/14 128/72  04/11/14 142/78   BP slightly elevated today, but generally has  been well controlled. Amlodipine changed to Verapamil.    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 Encouraged her to use prn Percocet for severe pain. Reviewed recent CT scan with her and her daughter.        Return in about 4 weeks (around 05/29/2014) for Recheck of Diabetes.

## 2014-05-01 NOTE — Assessment & Plan Note (Signed)
BP Readings from Last 3 Encounters:  05/01/14 150/88  04/11/14 128/72  04/11/14 142/78   BP slightly elevated today, but generally has been well controlled. Amlodipine changed to Verapamil.

## 2014-05-02 ENCOUNTER — Other Ambulatory Visit: Payer: Self-pay | Admitting: *Deleted

## 2014-05-02 MED ORDER — GLIPIZIDE 2.5 MG HALF TABLET: ORAL_TABLET | ORAL | Status: DC

## 2014-05-02 NOTE — Progress Notes (Signed)
Faxed to pharmacy

## 2014-05-08 ENCOUNTER — Other Ambulatory Visit: Payer: Self-pay | Admitting: Internal Medicine

## 2014-05-10 ENCOUNTER — Telehealth: Payer: Self-pay

## 2014-05-10 ENCOUNTER — Encounter: Payer: Self-pay | Admitting: Pulmonary Disease

## 2014-05-10 ENCOUNTER — Ambulatory Visit (INDEPENDENT_AMBULATORY_CARE_PROVIDER_SITE_OTHER): Payer: Medicare Other | Admitting: Pulmonary Disease

## 2014-05-10 VITALS — BP 128/78 | HR 71 | Ht 61.0 in | Wt 233.0 lb

## 2014-05-10 DIAGNOSIS — J849 Interstitial pulmonary disease, unspecified: Secondary | ICD-10-CM

## 2014-05-10 DIAGNOSIS — J8410 Pulmonary fibrosis, unspecified: Secondary | ICD-10-CM

## 2014-05-10 DIAGNOSIS — M7989 Other specified soft tissue disorders: Secondary | ICD-10-CM

## 2014-05-10 DIAGNOSIS — G4733 Obstructive sleep apnea (adult) (pediatric): Secondary | ICD-10-CM

## 2014-05-10 DIAGNOSIS — J9611 Chronic respiratory failure with hypoxia: Secondary | ICD-10-CM

## 2014-05-10 DIAGNOSIS — I270 Primary pulmonary hypertension: Secondary | ICD-10-CM

## 2014-05-10 DIAGNOSIS — I272 Pulmonary hypertension, unspecified: Secondary | ICD-10-CM

## 2014-05-10 NOTE — Assessment & Plan Note (Signed)
Continue oxygen as written, 3 L continuously

## 2014-05-10 NOTE — Assessment & Plan Note (Signed)
I have reviewed the images from the CT chest. 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 However the differential diagnosis of her diffuse pulmonary parenchymal lung disease is broad and includes chronic hypersensitivity pneumonitis, old infection, or an old inhalational injury. At this point I do not see a need for a biopsy to try to get to the bottom of this as #1 the disease does not appear to have progressed over the last several years and #2 she would not be a very good candidate for prednisone.  Plan: -follow with annual or q6 month PFT -serial 6 MW

## 2014-05-10 NOTE — Patient Instructions (Signed)
I will call your vascular surgeon about the leg pain and swelling I will call Dr. Caryl Comes about the verapamil We will see you back in 3 months or sooner if needed

## 2014-05-10 NOTE — Progress Notes (Signed)
Subjective:    Patient ID: Debra Saunders, female    DOB: May 07, 1946, 68 y.o.   MRN: [id number]  Synopsis: Gaylia Bringman is a very pleasant female with obstructive sleep apnea, obesity, diastolic dysfunction, and pulmonary hypertension who is referred to the [name] pulmonary clinic in November 2013 for evaluation of pulmonary hypertension. In 2005 she established care with Dr. Thom Chimes at [facility] for evaluation of the same. Dr. Gaynell Face felt that her pulmonary hypertension was do to her congestive heart failure, obesity, and obstructive sleep apnea. Unfortunately she lost her insurance around 2008 and did not have regular pulmonary visits. During the interval from 2008 2013 her shortness of breath increased and she gained about 50 pounds. She had a left heart catheterization in 2013 which showed very high filling pressures of the left ventricle as well as high systemic pressures but normal coronary arteries. In November 2013 her simple spirometry was completely normal. In 2014 she had a subarachnoid bleed and cardiac arrest and amazingly survived.  She had an IVC filter placed for PE.  HPI  Chief Complaint  Patient presents with  . Follow-up    Pt c/o worsening sob with exertion, prod cough worse in mornings with clear mucus. L leg swelling. Pt wears cpap 9 hours nightly.  Needs new mask.    05/10/2014 ROV > [place] tells me that her breathing has been about the same since the last visit. In general she is more short of breath with activity compared to he or ago. She states that she "gives out" easier now than she did before. She has not had to increase her oxygen level. No chest pain or cough. She is quite bothered by significant leg pain and swelling in the left leg. She continues to use her CPAP every night. Recently her cardiologist changed her medications to include Verapamil. She thinks that this may have caused her to be a little bit more sleepy.  Past  Medical History  Diagnosis Date  . Thyroid disorder   . Diabetes mellitus     type II  . Thyroid nodule   . Sleep apnea   . Obesity   . Hypertension   . Hyperlipidemia   . Urinary incontinence   . CHF (congestive heart failure) 2005    Dx at [facility]  . Chronic back pain   . COPD (chronic obstructive pulmonary disease)     2L [place]  . Sleep apnea   . Lipoma of back   . Arthritis   . Streptococcal bacteremia   . Acute renal failure   . Stroke   . Sepsis with metabolic encephalopathy   . Gastritis   . Symptomatic bradycardia      Review of Systems  Constitutional: Negative for fever, chills, activity change and fatigue.  HENT: Negative for congestion, postnasal drip and rhinorrhea.   Respiratory: Positive for shortness of breath. Negative for cough, chest tightness and wheezing.   Cardiovascular: Positive for leg swelling. Negative for chest pain and palpitations.       Objective:   Physical Exam  Filed Vitals:   05/10/14 1150  BP: 128/78  Pulse: 71  Height: 5\' 1"  (1.549 m)  Weight: 233 lb (105.688 kg)  SpO2: 98%  2.5L [place]  Gen: chronically ill appearing but in no acute distress HEENT: NCAT, , EOMi,  PULM: Crackles right base more than left CV: regular with PAC, no mgr, no JVD AB: BS+, soft, nontender,  Ext: warm, leg edema left leg,  pitting, no redness or swelling  2004 [facility]: right heart catheterization from 10/25/02 revealed a PA pressure of 62/25 (mean 36) and a mean PA pressure decreased remarkably to 20 during this catheterization. A PCWP was normal at 6 and a cardiac index of 2.42 L/m/m2 with an initial increase in vasodilator therapy to 2.71 L/m/m2 2004 6MW 313 meters (1,027 feet) On oxygen in 2004 04/22/2012 6 minute walk>> total distance walked 950 feet, heart rate ranged between 56 and 88, O2 saturation ranged between 91 and 98% on 2 L nasal cannula 11-26 Spirometry >> no obstruction, FEV1 1.42 L (70%) 11-26 6MW >> 795 Ft, O2 saturation 90% lowest on  2L 07/2012 6MW >> 1115 feet, O2 saturation 97% 2LNC     Assessment & Plan:   Diffuse Parenchymal Lung Disease I have reviewed the images from the CT chest. 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 However the differential diagnosis of her diffuse pulmonary parenchymal lung disease is broad and includes chronic hypersensitivity pneumonitis, old infection, or an old inhalational injury. At this point I do not see a need for a biopsy to try to get to the bottom of this as #1 the disease does not appear to have progressed over the last several years and #2 she would not be a very good candidate for prednisone.  Plan: -follow with annual or q6 month PFT -serial 6 MW  Leg swelling This is related to her DVT. I will refer her back to her vascular surgeon for evaluation for treatment of the ongoing pain. I have recommended warm classes. I wonder if she would be a thrombectomy candidate.  Chronic respiratory failure with hypoxia Continue oxygen as written, 3 L continuously  OSA (obstructive sleep apnea) Continue CPAP at 13 cm of water  Pulmonary hypertension This is secondary pulmonary hypertension. 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 Interestingly she did have a vasodilator response back in 2004. So I wonder if she may not have some benefit from slowly titrating up fever wrap ML. I'll contact her cardiologist to see how he feels about this.    Updated Medication List Outpatient Encounter Prescriptions as of 05/10/2014  Medication Sig  . acetaminophen (TYLENOL) 325 MG tablet Take 325 mg by mouth every 6 (six) hours as needed for pain.  [name] antiseptic oral rinse (BIOTENE) LIQD 15 mLs by Mouth Rinse route as needed for dry mouth.  . chlorthalidone (HYGROTON) 25 MG tablet Take 0.5 tablets (12.5 mg total) by mouth daily.  . cyclobenzaprine (FLEXERIL) 10 MG tablet Take 1  tablet (10 mg total) by mouth 3 (three) times daily as needed for muscle spasms.  . furosemide (LASIX) 20 MG tablet TAKE ONE TABLET BY MOUTH TWICE DAILY  . glipiZIDE (GLUCOTROL) 2.5 mg TABS tablet 2.5 mg once daily with breakfast  . meclizine (ANTIVERT) 50 MG tablet Take 1 tablet (50 mg total) by mouth 2 (two) times daily as needed for dizziness.  . Menthol, Topical Analgesic, (BIOFREEZE EX) Apply 1 each topically as needed (for pain).  . naproxen (EC NAPROSYN) 500 MG EC tablet Take 1 tablet (500 mg total) by mouth 2 (two) times daily as needed.  . NON FORMULARY Oxygen 3 liters daily.  . NON FORMULARY CPAP  . nystatin (MYCOSTATIN/NYSTOP) 100000 UNIT/GM POWD Apply to affected area as directed  . oxyCODONE-acetaminophen (PERCOCET/ROXICET) 5-325 MG per tablet Take 0.5 tablets by mouth every 8 (  eight) hours as needed for severe pain.  . polyethylene glycol (MIRALAX / GLYCOLAX) packet Take 17 g by mouth daily as needed for moderate constipation.  . potassium chloride SA (KLOR-CON M20) 20 MEQ tablet Take 1 tablet (20 mEq total) by mouth 2 (two) times daily.  . promethazine (PHENERGAN) 25 MG tablet Take 25 mg by mouth every 4 (four) hours as needed for nausea.  . Simethicone (GAS-X PO) Take 1 tablet by mouth as needed (for gas).   . verapamil (CALAN-SR) 120 MG CR tablet Take 1 tablet (120 mg total) by mouth daily.

## 2014-05-10 NOTE — Assessment & Plan Note (Signed)
This is related to her DVT. I will refer her back to her vascular surgeon for evaluation for treatment of the ongoing pain. I have recommended warm classes. I wonder if she would be a thrombectomy candidate.

## 2014-05-10 NOTE — Assessment & Plan Note (Signed)
This is secondary pulmonary hypertension. 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 Interestingly she did have a vasodilator response back in 2004. So I wonder if she may not have some benefit from slowly titrating up fever wrap ML. I'll contact her cardiologist to see how he feels about this.

## 2014-05-10 NOTE — Assessment & Plan Note (Signed)
Continue CPAP at 13 cm of water

## 2014-05-10 NOTE — Telephone Encounter (Signed)
Called [facility] to try and get this patient in to be seen.  Was told that there were no openings and that she would not be able to be seen until Jan. 2016 with any of the providers in the practice.  Is there another practice you'd like me to contact?

## 2014-05-10 NOTE — Telephone Encounter (Signed)
Per BQ, pt can either call  [facility] herself to see if she can get in sooner or we can refer her to [facility].  Pt states she does not have a way to get to [facility], will try calling herself.  I advised her to keep [place] updated and let us know if she cannot get in to be seen sooner.  Nothing further needed at this time.

## 2014-05-19 ENCOUNTER — Telehealth: Payer: Self-pay

## 2014-05-19 NOTE — Telephone Encounter (Signed)
-----   Message from Juanito Doom, MD sent at 05/18/2014  4:39 AM EST ----- A,  Please let her know that I have discussed her situation with Dr. Caryl Comes.  I would like her to increase the Verapamil to 240mg  daily to treat her pulmonary hypertension better.  Thanks B ----- Message -----    From: Deboraha Sprang, MD    Sent: 05/17/2014   5:01 PM      To: Juanito Doom, MD  Should bre fine ----- Message -----    From: Juanito Doom, MD    Sent: 05/10/2014   1:00 PM      To: Deboraha Sprang, MD  [place]aze Justin is a very pleasant patient of mine who has pulmonary hypertension, WHO class II and WHO class III. Interestingly she had vasodilator response when she had a right heart catheterization by Rich Reining in 2004. I see that she recently changed her Verapamil SR. If it's okay by you at like to continue to titrate this up to see if she gets any response. I explained to her that her dyspnea is very complicated and I do not expect a dramatic response at all.  Thanks, Ruby Cola

## 2014-05-19 NOTE — Telephone Encounter (Signed)
lmtcb X1 for pt  

## 2014-05-22 ENCOUNTER — Ambulatory Visit (INDEPENDENT_AMBULATORY_CARE_PROVIDER_SITE_OTHER): Payer: Medicare Other | Admitting: Cardiovascular Disease

## 2014-05-22 ENCOUNTER — Encounter: Payer: Self-pay | Admitting: Cardiovascular Disease

## 2014-05-22 VITALS — BP 158/98 | HR 65 | Ht 61.0 in | Wt 234.8 lb

## 2014-05-22 DIAGNOSIS — R0789 Other chest pain: Secondary | ICD-10-CM

## 2014-05-22 DIAGNOSIS — I5032 Chronic diastolic (congestive) heart failure: Secondary | ICD-10-CM

## 2014-05-22 DIAGNOSIS — R001 Bradycardia, unspecified: Secondary | ICD-10-CM

## 2014-05-22 DIAGNOSIS — I10 Essential (primary) hypertension: Secondary | ICD-10-CM

## 2014-05-22 NOTE — Assessment & Plan Note (Signed)
Status post dual-chamber pacemaker placement. She has been doing well. Sinus tachycardia improved after switching amlodipine to verapamil.

## 2014-05-22 NOTE — Progress Notes (Signed)
Primary care physician: Dr. Ronette Deter  HPI  This is a 68 -year-old female who is here today for a followup visit.  She has known history of chronic diastolic heart failure with pulmonary hypertension, bradycardia and chronic atypical chest pain thought to be due to costochondritis. She has chronic left lower extremity swelling which has been evaluated in the past with venous duplex ultrasound on multiple occasions and was negative for DVT. She did have a nonocclusive DVT on ultrasound last year with subsequent IVC filter placement. She likely has lymphedema on the left side as well. Her echocardiogram in 2013 showed normal LV systolic function with mild diastolic dysfunction and evidence of elevated filling pressures. There was also moderate pulmonary hypertension with peak systolic pressure of 56 mm Hg. she underwent cardiac catheterization in August of 2013 which showed no evidence of obstructive coronary artery disease. She was noted to be severely hypertensive with significant elevation in left ventricular end-diastolic pressure.  She was hospitalized in July, 2014 at [facility] for severe chest pain. Cardiac workup was overall negative. CT of the chest showed possible peripheral pulmonary emboli which really did not explain her symptoms. She was anticoagulated which was complicated by subarachnoid hemorrhage and cardiopulmonary arrest. She was transferred to [facility] and was treated conservatively. Anticoagulation was discontinued.[name] She was discharged home but readmitted in August at [facility] with septic shock and acute renal failure requiring brief dialysis. She ultimately improved. Renal function went back to normal.  She continued to have symptomatic bradycardia and underwent a dual-chamber pacemaker placement eye Dr. Caryl Comes in July. She had significant improvement in fatigue, dyspnea and dizziness after that. She had sinus tachycardia and was switched from amlodipine to verapamil. She has been doing  reasonably well. Blood pressure is elevated today because she was rushing to clinic. Recent blood pressure has actually been well-controlled.   Allergies  Allergen Reactions  . Other     Blood Thinners-per doctor at [facility]  . Penicillins Hives and Swelling     Current Outpatient Prescriptions on File Prior to Visit  Medication Sig Dispense Refill  . acetaminophen (TYLENOL) 325 MG tablet Take 325 mg by mouth every 6 (six) hours as needed for pain.    [name] antiseptic oral rinse (BIOTENE) LIQD 15 mLs by Mouth Rinse route as needed for dry mouth.    . chlorthalidone (HYGROTON) 25 MG tablet Take 0.5 tablets (12.5 mg total) by mouth daily. 30 tablet 5  . cyclobenzaprine (FLEXERIL) 10 MG tablet Take 1 tablet (10 mg total) by mouth 3 (three) times daily as needed for muscle spasms. 60 tablet 3  . furosemide (LASIX) 20 MG tablet TAKE ONE TABLET BY MOUTH TWICE DAILY 60 tablet 0  . glipiZIDE (GLUCOTROL) 2.5 mg TABS tablet 2.5 mg once daily with breakfast 30 tablet 3  . meclizine (ANTIVERT) 50 MG tablet Take 1 tablet (50 mg total) by mouth 2 (two) times daily as needed for dizziness. 30 tablet 0  . Menthol, Topical Analgesic, (BIOFREEZE EX) Apply 1 each topically as needed (for pain).    . naproxen (EC NAPROSYN) 500 MG EC tablet Take 1 tablet (500 mg total) by mouth 2 (two) times daily as needed. 60 tablet 3  . NON FORMULARY Oxygen 3 liters daily.    . NON FORMULARY CPAP    . oxyCODONE-acetaminophen (PERCOCET/ROXICET) 5-325 MG per tablet Take 0.5 tablets by mouth every 8 (eight) hours as needed for severe pain.    . polyethylene glycol (MIRALAX / GLYCOLAX) packet Take  17 g by mouth daily as needed for moderate constipation.    . potassium chloride SA (KLOR-CON M20) 20 MEQ tablet Take 1 tablet (20 mEq total) by mouth 2 (two) times daily. 60 tablet 6  . promethazine (PHENERGAN) 25 MG tablet Take 25 mg by mouth every 4 (four) hours as needed for nausea.    . Simethicone (GAS-X PO) Take 1 tablet by  mouth as needed (for gas).     . verapamil (CALAN-SR) 120 MG CR tablet Take 1 tablet (120 mg total) by mouth daily. 30 tablet 6   No current facility-administered medications on file prior to visit.     Past Medical History  Diagnosis Date  . Thyroid disorder   . Diabetes mellitus     type II  . Thyroid nodule   . Sleep apnea   . Obesity   . Hypertension   . Hyperlipidemia   . Urinary incontinence   . CHF (congestive heart failure) 2005    Dx at [facility]  . Chronic back pain   . COPD (chronic obstructive pulmonary disease)     2L [place]  . Sleep apnea   . Lipoma of back   . Arthritis   . Streptococcal bacteremia   . Acute renal failure   . Stroke   . Sepsis with metabolic encephalopathy   . Gastritis   . Symptomatic bradycardia      Past Surgical History  Procedure Laterality Date  . Cholecystectomy    . Vaginal hysterectomy    . Cardiac catheterization  2013    @ [place]: No obstructive CAD: Only 20% ostial left circumflex.  . Abdominal hysterectomy  1969  . Cyst removal trunk  2002    BACK  . Lipoma removal  2014    back  . Pacemaker insertion  2015    [place] dual chamber pacemaker implanted by Dr Caryl Comes for symptomatic bradycardia  . Insert / replace / remove pacemaker       Family History  Problem Relation Age of Onset  . Heart disease Mother   . Hypertension Mother   . COPD Mother     was a smoker  . Thyroid disease Mother   . Hypertension Father   . Hypertension Sister   . Thyroid disease Sister   . Hypertension Sister   . Thyroid disease Sister   . Breast cancer Maternal Aunt      History   Social History  . Marital Status: Divorced    Spouse Name: N/A    Number of Children: N/A  . Years of Education: N/A   Occupational History  . Retired- factory work     exposed to [place]  . Smoking status: Former Smoker -- 0.30 packs/day for 2 years    Types: Cigarettes    Quit date: 06/23/1990  . Smokeless  tobacco: Never Used  . Alcohol Use: No  . Drug Use: No  . Sexual Activity: Not on file   Other Topics Concern  . Not on file   Social History Narrative   Lives in [place] with daughter. Has 4 children. No pets.      Work - Restaurant manager, fast food, retired      Diet - regular       PHYSICAL EXAM   BP 158/98 mmHg  Pulse 65  Ht 5\' 1"  (1.549 m)  Wt 234 lb 12 oz (106.482 kg)  BMI 44.38 kg/m2 Constitutional: She is oriented to person, place,  and time. She appears well-developed and well-nourished. No distress.  HENT: No nasal discharge.  Head: Normocephalic and atraumatic.  Eyes: Pupils are equal and round. Right eye exhibits no discharge. Left eye exhibits no discharge.  Neck: Normal range of motion. Neck supple. No JVD present. No thyromegaly present.  Cardiovascular: Normal rate, regular rhythm, normal heart sounds. Exam reveals no gallop and no friction rub. No murmur heard.  Pulmonary/Chest: Effort normal and breath sounds normal. No stridor. No respiratory distress. She has no wheezes. She has no rales. She exhibits no tenderness.  Abdominal: Soft. Bowel sounds are normal. She exhibits no distension. There is no tenderness. There is no rebound and no guarding.  Musculoskeletal: Normal range of motion. She exhibits chronic left lower extremity edema and no tenderness.  Neurological: She is alert and oriented to person, place, and time. Coordination normal.  Skin: Skin is warm and dry. No rash noted. She is not diaphoretic. No erythema. No pallor.  Psychiatric: She has a normal mood and affect. Her behavior is normal. Judgment and thought content normal.   EKG: Sinus  Rhythm  - frequent PAC s  # PACs = 2. -  Nonspecific T-abnormality.   ABNORMAL     ASSESSMENT AND PLAN

## 2014-05-22 NOTE — Patient Instructions (Signed)
Continue same medications.   Your physician wants you to follow-up in: 6 months.  You will receive a reminder letter in the mail two months in advance. If you don't receive a letter, please call our office to schedule the follow-up appointment.  

## 2014-05-22 NOTE — Assessment & Plan Note (Signed)
Blood pressure is elevated today. However, recent blood pressure has been controlled.

## 2014-05-22 NOTE — Assessment & Plan Note (Signed)
She appears to be euvolemic. She is on 2 diuretics including chlorthalidone and furosemide. Recent basic metabolic profile showed normal renal function and electrolytes.

## 2014-05-30 NOTE — Telephone Encounter (Signed)
atc pt X3, fast busy signal

## 2014-06-01 ENCOUNTER — Encounter (HOSPITAL_COMMUNITY): Payer: Self-pay | Admitting: Internal Medicine

## 2014-06-01 NOTE — Telephone Encounter (Signed)
lmtcb for pt.  

## 2014-06-07 NOTE — Telephone Encounter (Signed)
lmtcb for pt.  Will send letter to patient and sign off on message per triage protocol.

## 2014-06-09 MED ORDER — VERAPAMIL HCL ER 240 MG PO TBCR: 240.0000 mg | EXTENDED_RELEASE_TABLET | Freq: Every day | ORAL | Status: DC

## 2014-06-09 NOTE — Addendum Note (Signed)
Addended by: Rosana Berger on: 06/09/2014 02:30 PM   Modules accepted: Orders

## 2014-06-09 NOTE — Telephone Encounter (Signed)
Pt called back  I informed her of the below recs per BQ  She verbalized understanding  I have sent verapamil 240 to her pharm

## 2014-06-20 ENCOUNTER — Ambulatory Visit (INDEPENDENT_AMBULATORY_CARE_PROVIDER_SITE_OTHER): Payer: Medicare Other | Admitting: Internal Medicine

## 2014-06-22 ENCOUNTER — Ambulatory Visit (INDEPENDENT_AMBULATORY_CARE_PROVIDER_SITE_OTHER): Payer: Medicare Other | Admitting: Internal Medicine

## 2014-06-22 ENCOUNTER — Encounter: Payer: Self-pay | Admitting: Internal Medicine

## 2014-06-22 ENCOUNTER — Ambulatory Visit (INDEPENDENT_AMBULATORY_CARE_PROVIDER_SITE_OTHER)
Admission: RE | Admit: 2014-06-22 | Discharge: 2014-06-22 | Disposition: A | Discharge status: Home / Self Care | Payer: Medicare Other | Source: Ambulatory Visit | Attending: Internal Medicine | Admitting: Internal Medicine

## 2014-06-22 VITALS — BP 130/82 | HR 60 | Temp 98.1°F | Ht 62.0 in | Wt 233.0 lb

## 2014-06-22 DIAGNOSIS — E119 Type 2 diabetes mellitus without complications: Secondary | ICD-10-CM

## 2014-06-22 DIAGNOSIS — J9620 Acute and chronic respiratory failure, unspecified whether with hypoxia or hypercapnia: Secondary | ICD-10-CM | POA: Insufficient documentation

## 2014-06-22 DIAGNOSIS — J9611 Chronic respiratory failure with hypoxia: Secondary | ICD-10-CM

## 2014-06-22 DIAGNOSIS — R0600 Dyspnea, unspecified: Secondary | ICD-10-CM

## 2014-06-22 LAB — COMPREHENSIVE METABOLIC PANEL
ALT: 50 U/L — ABNORMAL HIGH (ref 0–35)
AST: 58 U/L — ABNORMAL HIGH (ref 0–37)
Albumin: 3.6 g/dL (ref 3.5–5.2)
Alkaline Phosphatase: 104 U/L (ref 39–117)
BUN: 19 mg/dL (ref 6–23)
CO2: 30 mEq/L (ref 19–32)
Calcium: 9 mg/dL (ref 8.4–10.5)
Chloride: 98 mEq/L (ref 96–112)
Creatinine, Ser: 0.8 mg/dL (ref 0.4–1.2)
GFR: 90.94 mL/min (ref >60.00)
Glucose, Bld: 187 mg/dL — ABNORMAL HIGH (ref 70–99)
Potassium: 3.7 mEq/L (ref 3.5–5.1)
Sodium: 137 mEq/L (ref 135–145)
Total Bilirubin: 0.3 mg/dL (ref 0.2–1.2)
Total Protein: 7.7 g/dL (ref 6.0–8.3)

## 2014-06-22 LAB — CBC WITH DIFFERENTIAL/PLATELET
Basophils Absolute: 0 10*3/uL (ref 0.0–0.1)
Basophils Relative: 0.6 % (ref 0.0–3.0)
Eosinophils Absolute: 0.3 10*3/uL (ref 0.0–0.7)
Eosinophils Relative: 4.4 % (ref 0.0–5.0)
HCT: 37 % (ref 36.0–46.0)
Hemoglobin: 11.7 g/dL — ABNORMAL LOW (ref 12.0–15.0)
Lymphocytes Relative: 27.6 % (ref 12.0–46.0)
Lymphs Abs: 1.8 10*3/uL (ref 0.7–4.0)
MCHC: 31.5 g/dL (ref 30.0–36.0)
MCV: 85.2 fl (ref 78.0–100.0)
Monocytes Absolute: 0.5 10*3/uL (ref 0.1–1.0)
Monocytes Relative: 7.3 % (ref 3.0–12.0)
Neutro Abs: 3.9 10*3/uL (ref 1.4–7.7)
Neutrophils Relative %: 60.1 % (ref 43.0–77.0)
Platelets: 245 10*3/uL (ref 150.0–400.0)
RBC: 4.35 Mil/uL (ref 3.87–5.11)
RDW: 14.2 % (ref 11.5–15.5)
WBC: 6.5 10*3/uL (ref 4.0–10.5)

## 2014-06-22 LAB — BRAIN NATRIURETIC PEPTIDE: Pro B Natriuretic peptide (BNP): 45 pg/mL (ref 0.0–100.0)

## 2014-06-22 NOTE — Assessment & Plan Note (Signed)
Recent worsening of chronic dyspnea. Suspect viral upper respiratory infection played a role. Will get CXR today. Check BNP. Follow up here next week and prn.

## 2014-06-22 NOTE — Assessment & Plan Note (Signed)
Pt reports good control of BG on Glipizide. Will plan to check A1c in 07/2014.

## 2014-06-22 NOTE — Progress Notes (Signed)
Subjective:    Patient ID: Debra Saunders, female    DOB: Aug 13, 1945, 68 y.o.   MRN: [id number]  HPI 68YO female presents for follow up.  DM -  Last A1c in 04/2014 was elevated at 8.4%. BG running 160-190s. No low BG less than 70.  Chronic hypoxia - Feeling more short of breath over last few weeks. Occasional cough at night. No fever. Some anterior chest tightness. Had some cold symptoms for a couple of days, now resolved. Continues on 3L [place] at home.   Past medical, surgical, family and social history per today's encounter.  Review of Systems  Constitutional: Positive for fatigue. Negative for fever, chills, appetite change and unexpected weight change.  Eyes: Negative for visual disturbance.  Respiratory: Positive for cough, chest tightness and shortness of breath. Negative for wheezing.   Cardiovascular: Negative for chest pain and leg swelling.  Gastrointestinal: Negative for nausea, vomiting, abdominal pain, diarrhea and constipation.  Skin: Negative for color change and rash.  Hematological: Negative for adenopathy. Does not bruise/bleed easily.  Psychiatric/Behavioral: Negative for sleep disturbance and dysphoric mood. The patient is not nervous/anxious.        Objective:    BP 130/82 mmHg  Pulse 60  Temp(Src) 98.1 F (36.7 C) (Oral)  Ht 5\' 2"  (1.575 m)  Wt 233 lb (105.688 kg)  BMI 42.61 kg/m2  SpO2 99% Physical Exam  Constitutional: She is oriented to person, place, and time. She appears well-developed and well-nourished. No distress.  HENT:  Head: Normocephalic and atraumatic.  Right Ear: External ear normal.  Left Ear: External ear normal.  Nose: Nose normal.  Mouth/Throat: Oropharynx is clear and moist. No oropharyngeal exudate.  Eyes: Conjunctivae are normal. Pupils are equal, round, and reactive to light. Right eye exhibits no discharge. Left eye exhibits no discharge. No scleral icterus.  Neck: Normal range of motion. Neck supple. No tracheal deviation  present. No thyromegaly present.  Cardiovascular: Normal rate, regular rhythm, normal heart sounds and intact distal pulses.  Exam reveals no gallop and no friction rub.   No murmur heard. Pulmonary/Chest: Effort normal. No accessory muscle usage. No tachypnea. No respiratory distress. She has no decreased breath sounds. She has wheezes (initial left apical wheeze, cleared with respiration). She has no rhonchi. She has no rales. She exhibits no tenderness.  Musculoskeletal: Normal range of motion. She exhibits no edema or tenderness.  Lymphadenopathy:    She has no cervical adenopathy.  Neurological: She is alert and oriented to person, place, and time. No cranial nerve deficit. She exhibits normal muscle tone. Coordination normal.  Skin: Skin is warm and dry. No rash noted. She is not diaphoretic. No erythema. No pallor.  Psychiatric: She has a normal mood and affect. Her behavior is normal. Judgment and thought content normal.          Assessment & Plan:   Problem List Items Addressed This Visit      Unprioritized   Chronic respiratory failure with hypoxia    Recent worsening symptoms of dyspnea. She appears euvolemic. Exam is normal except for rare wheeze which cleared on exam. Suspect recent viral illness contributed to symptoms. Will get chest xray. Will check BNP with labs.    Relevant Orders      CBC w/Diff   Diabetes mellitus type 2, controlled - Primary    Pt reports good control of BG on Glipizide. Will plan to check A1c in 07/2014.    Relevant Orders      Comprehensive  metabolic panel   Dyspnea    Recent worsening of chronic dyspnea. Suspect viral upper respiratory infection played a role. Will get CXR today. Check BNP. Follow up here next week and prn.     Relevant Orders      DG Chest 2 View      B Nat Peptide       Return in about 1 week (around 06/29/2014).

## 2014-06-22 NOTE — Progress Notes (Signed)
Pre visit review using our clinic review tool, if applicable. No additional management support is needed unless otherwise documented below in the visit note. 

## 2014-06-22 NOTE — Patient Instructions (Signed)
We will check labs and get chest xray today.  Follow up next week

## 2014-06-22 NOTE — Assessment & Plan Note (Signed)
Recent worsening symptoms of dyspnea. She appears euvolemic. Exam is normal except for rare wheeze which cleared on exam. Suspect recent viral illness contributed to symptoms. Will get chest xray. Will check BNP with labs.

## 2014-06-25 ENCOUNTER — Other Ambulatory Visit: Payer: Self-pay | Admitting: Internal Medicine

## 2014-06-29 ENCOUNTER — Ambulatory Visit (INDEPENDENT_AMBULATORY_CARE_PROVIDER_SITE_OTHER): Payer: Commercial Managed Care - HMO | Admitting: Internal Medicine

## 2014-06-29 ENCOUNTER — Encounter: Payer: Self-pay | Admitting: Internal Medicine

## 2014-06-29 VITALS — BP 151/84 | HR 64 | Temp 98.0°F | Ht 62.0 in | Wt 234.2 lb

## 2014-06-29 DIAGNOSIS — R945 Abnormal results of liver function studies: Secondary | ICD-10-CM

## 2014-06-29 DIAGNOSIS — R7989 Other specified abnormal findings of blood chemistry: Secondary | ICD-10-CM | POA: Insufficient documentation

## 2014-06-29 DIAGNOSIS — R0600 Dyspnea, unspecified: Secondary | ICD-10-CM

## 2014-06-29 DIAGNOSIS — I5032 Chronic diastolic (congestive) heart failure: Secondary | ICD-10-CM

## 2014-06-29 DIAGNOSIS — I10 Essential (primary) hypertension: Secondary | ICD-10-CM | POA: Diagnosis not present

## 2014-06-29 LAB — COMPREHENSIVE METABOLIC PANEL
ALT: 49 U/L — ABNORMAL HIGH (ref 0–35)
AST: 60 U/L — ABNORMAL HIGH (ref 0–37)
Albumin: 3.5 g/dL (ref 3.5–5.2)
Alkaline Phosphatase: 101 U/L (ref 39–117)
BUN: 19 mg/dL (ref 6–23)
CO2: 29 mEq/L (ref 19–32)
Calcium: 8.6 mg/dL (ref 8.4–10.5)
Chloride: 94 mEq/L — ABNORMAL LOW (ref 96–112)
Creatinine, Ser: 0.7 mg/dL (ref 0.4–1.2)
GFR: 109.7 mL/min (ref >60.00)
Glucose, Bld: 250 mg/dL — ABNORMAL HIGH (ref 70–99)
Potassium: 3.2 mEq/L — ABNORMAL LOW (ref 3.5–5.1)
Sodium: 132 mEq/L — ABNORMAL LOW (ref 135–145)
Total Bilirubin: 0.5 mg/dL (ref 0.2–1.2)
Total Protein: 7.7 g/dL (ref 6.0–8.3)

## 2014-06-29 NOTE — Assessment & Plan Note (Signed)
BP Readings from Last 3 Encounters:  06/29/14 151/84  06/22/14 130/82  05/22/14 158/98   BP slightly elevated today, but generally has been well controlled. Will continue current medications and monitor for now.

## 2014-06-29 NOTE — Progress Notes (Signed)
Subjective:    Patient ID: Debra Saunders, female    DOB: 02-09-1946, 69 y.o.   MRN: [id number]  HPI 69YO female presents for follow up.  Last seen 12/31 - noted to have worsening fatigue, dyspnea. Labs were remarkable for elevated LFTs. Continues to feel short of breath. No chest pain, fever. Occasional cough at night. Notes that CPAP mask does not fit and it often slips off at night. Has follow up with Dr. Stevenson Clinch on Tuesday.    Wt Readings from Last 3 Encounters:  06/29/14 234 lb 4 oz (106.255 kg)  06/22/14 233 lb (105.688 kg)  05/22/14 234 lb 12 oz (106.482 kg)     Past medical, surgical, family and social history per today's encounter.  Review of Systems  Constitutional: Positive for fatigue. Negative for fever, chills, appetite change and unexpected weight change.  Eyes: Negative for visual disturbance.  Respiratory: Positive for cough and shortness of breath.   Cardiovascular: Negative for chest pain and leg swelling.  Gastrointestinal: Negative for abdominal pain, diarrhea and constipation.  Musculoskeletal: Negative for myalgias and arthralgias.  Skin: Negative for color change and rash.  Neurological: Positive for weakness.  Hematological: Negative for adenopathy. Does not bruise/bleed easily.  Psychiatric/Behavioral: Negative for sleep disturbance and dysphoric mood. The patient is not nervous/anxious.        Objective:    BP 151/84 mmHg  Pulse 64  Temp(Src) 98 F (36.7 C) (Oral)  Ht 5\' 2"  (1.575 m)  Wt 234 lb 4 oz (106.255 kg)  BMI 42.83 kg/m2  SpO2 98% Physical Exam  Constitutional: She is oriented to person, place, and time. She appears well-developed and well-nourished. No distress.  HENT:  Head: Normocephalic and atraumatic.  Right Ear: External ear normal.  Left Ear: External ear normal.  Nose: Nose normal.  Mouth/Throat: Oropharynx is clear and moist. No oropharyngeal exudate.  Eyes: Conjunctivae are normal. Pupils are equal, round, and  reactive to light. Right eye exhibits no discharge. Left eye exhibits no discharge. No scleral icterus.  Neck: Normal range of motion. Neck supple. No tracheal deviation present. No thyromegaly present.  Cardiovascular: Normal rate, regular rhythm, normal heart sounds and intact distal pulses.  Exam reveals no gallop and no friction rub.   No murmur heard. Pulmonary/Chest: Effort normal and breath sounds normal. No accessory muscle usage. No tachypnea. No respiratory distress. She has no decreased breath sounds. She has no wheezes. She has no rhonchi. She has no rales. She exhibits no tenderness.  Musculoskeletal: Normal range of motion. She exhibits no edema or tenderness.  Lymphadenopathy:    She has no cervical adenopathy.  Neurological: She is alert and oriented to person, place, and time. No cranial nerve deficit. She exhibits normal muscle tone. Coordination normal.  Skin: Skin is warm and dry. No rash noted. She is not diaphoretic. No erythema. No pallor.  Psychiatric: She has a normal mood and affect. Her behavior is normal. Judgment and thought content normal.          Assessment & Plan:   Problem List Items Addressed This Visit      Unprioritized   Chronic diastolic heart failure - Primary    Appears euvolemic. Recent BNP normal. Will continue current medications. If dyspnea persists, we discussed repeating ECHO.    Dyspnea    Persistent dyspnea, more than baseline. Mostly occuring at night. She notes her facemask is slipping off at night and I question if this may be contributing to cough and dyspnea. Question  if she might benefit from larger facemask for CPAP. Encouraged her to discuss this with Dr. Stevenson Clinch at her visit. Follow up in 4 weeks.    Elevated LFTs    Will check LFTs with labs today.    Relevant Orders      Comprehensive metabolic panel   Hypertension    BP Readings from Last 3 Encounters:  06/29/14 151/84  06/22/14 130/82  05/22/14 158/98   BP slightly  elevated today, but generally has been well controlled. Will continue current medications and monitor for now.        Return in about 4 weeks (around 07/27/2014) for Recheck.

## 2014-06-29 NOTE — Patient Instructions (Signed)
Please keep scheduled visit with Dr. Stevenson Clinch and Dr. Fletcher Anon.  Repeat labs today.  Follow up in 4 weeks.

## 2014-06-29 NOTE — Assessment & Plan Note (Signed)
Persistent dyspnea, more than baseline. Mostly occuring at night. She notes her facemask is slipping off at night and I question if this may be contributing to cough and dyspnea. Question if she might benefit from larger facemask for CPAP. Encouraged her to discuss this with Dr. Stevenson Clinch at her visit. Follow up in 4 weeks.

## 2014-06-29 NOTE — Progress Notes (Signed)
Pre visit review using our clinic review tool, if applicable. No additional management support is needed unless otherwise documented below in the visit note. 

## 2014-06-29 NOTE — Assessment & Plan Note (Signed)
Appears euvolemic. Recent BNP normal. Will continue current medications. If dyspnea persists, we discussed repeating ECHO.

## 2014-06-29 NOTE — Assessment & Plan Note (Signed)
Will check LFTs with labs today. 

## 2014-06-30 ENCOUNTER — Telehealth: Payer: Self-pay

## 2014-06-30 NOTE — Telephone Encounter (Signed)
Home health nurse called, would like orders to assist pt with CHF, states pt has SOB with minimal exertion. Please call and advise

## 2014-06-30 NOTE — Telephone Encounter (Signed)
Verbal order given to Levada Dy to continue home health to manage CHF from Dr. Sylvie Farrier conformed order

## 2014-07-04 ENCOUNTER — Telehealth: Payer: Self-pay

## 2014-07-04 ENCOUNTER — Ambulatory Visit (INDEPENDENT_AMBULATORY_CARE_PROVIDER_SITE_OTHER): Payer: Commercial Managed Care - HMO | Admitting: Internal Medicine

## 2014-07-04 VITALS — BP 138/80 | HR 70 | Temp 98.4°F | Ht 62.0 in | Wt 236.0 lb

## 2014-07-04 DIAGNOSIS — J8410 Pulmonary fibrosis, unspecified: Secondary | ICD-10-CM

## 2014-07-04 DIAGNOSIS — I270 Primary pulmonary hypertension: Secondary | ICD-10-CM

## 2014-07-04 DIAGNOSIS — M940 Chondrocostal junction syndrome [Tietze]: Secondary | ICD-10-CM | POA: Diagnosis not present

## 2014-07-04 DIAGNOSIS — R0602 Shortness of breath: Secondary | ICD-10-CM

## 2014-07-04 DIAGNOSIS — I272 Pulmonary hypertension, unspecified: Secondary | ICD-10-CM

## 2014-07-04 MED ORDER — IBUPROFEN 600 MG PO TABS: 600.0000 mg | ORAL_TABLET | Freq: Three times a day (TID) | ORAL | Status: DC | PRN

## 2014-07-04 NOTE — Patient Instructions (Signed)
Please follow up in 1 month with Dr. Lake Bells. We have sent Ibuprofen to your pharmacy.

## 2014-07-04 NOTE — Telephone Encounter (Signed)
Debra Saunders asks that we call and let her know when referral is made.

## 2014-07-04 NOTE — Telephone Encounter (Signed)
Calling states that due to pt insurance [facility]), pt will need another home health care referral. [facility] is not in network with pt new insurance. Debra Saunders states that she thinks [facility] is in network with pt Insurance.

## 2014-07-04 NOTE — Assessment & Plan Note (Signed)
I think this is most likely WHO class II and 3 disease. No clear indication for a pulmonary vasodilator. Continue to maximize CHF and OSA treatment.

## 2014-07-04 NOTE — Assessment & Plan Note (Signed)
I agree with Dr. Lake Bells her differential diagnosis based on previous clinical symptoms and imaging give a differential of pulmonary sarcoidosis, however the differential diagnosis of her diffuse pulmonary parenchymal lung disease is broad and includes chronic hypersensitivity pneumonitis, old infection, or an old inhalational injury. At this point no need for a biopsy to try to get to the bottom of this as #1 the disease does not appear to have progressed over the last several years and #2 she would not be a very good candidate for prednisone.  Plan: -follow with annual or q6 month PFT -serial 6 MWT

## 2014-07-04 NOTE — Assessment & Plan Note (Signed)
Symptoms consistent with costochondritis  Plan - Ibuprofen 600mg  TID x 3 days - follow up with Cardiology - I have spoken with Dr. Fletcher Anon who knows patient well and follows her closely, he stated to increase her lasix to 2 pills in the AM and 2 pills in the PM, then follow up with in within a week, also make a journal of daily weights.

## 2014-07-04 NOTE — Progress Notes (Signed)
MRN# [id number] Debra Saunders 06/25/1945   CC: Chief Complaint  Patient presents with  . Acute Visit    Pt has has increased sob. She has chest tightness and having trouble sleeping. Patient denies cough/wheezing. Pt had abnormal chest xray 2 weeks ago and sent her by Dr. Gilford Rile.      Brief History: Synopsis: Debra Saunders is a very pleasant female with obstructive sleep apnea, obesity, diastolic dysfunction, and pulmonary hypertension who is referred to the [facility] pulmonary clinic in November 2013 for evaluation of pulmonary hypertension. In 2005 she established care with Dr. Thom Chimes at [facility] for evaluation of the same. Dr. Gaynell Face felt that her pulmonary hypertension was do to her congestive heart failure, obesity, and obstructive sleep apnea. Unfortunately she lost her insurance around 2008 and did not have regular pulmonary visits. During the interval from 2008 2013 her shortness of breath increased and she gained about 50 pounds. She had a left heart catheterization in 2013 which showed very high filling pressures of the left ventricle as well as high systemic pressures but normal coronary arteries. In November 2013 her simple spirometry was completely normal. In 2014 she had a subarachnoid bleed and cardiac arrest and amazingly survived. She had an IVC filter placed for PE.  05/10/2014 ROV > Debra Saunders tells me that her breathing has been about the same since the last visit. In general she is more short of breath with activity compared to he or ago. She states that she "gives out" easier now than she did before. She has not had to increase her oxygen level. No chest pain or cough. She is quite bothered by significant leg pain and swelling in the left leg. She continues to use her CPAP every night. Recently her cardiologist changed her medications to include Verapamil. She thinks that this may have caused her to be a little bit more sleepy Plan - spoke  with Cardiology, increased Verapamil to 240mg , no need for VATS or prednisone yet as imaging and symptoms have been fairly stable.   Events since last clinic visit: Patient presents today for an acute care visit of increase sob, chest tightness and tenderness.  She has been having these symptoms for the past 3 wks, saw Dr. Gilford Rile, who ordered a CXR on 06/22/14 (see results below), and increased her lasix dose for two days, which did not provide much relief.  She was then referred to pulmonary for further evaluation.  Chest, sternum, is tender to touch, and currently on 3L O2, patient also stated that she gained about 3 lbs in the last 3 wks, with inc leg swelling, she has also had some mild night chills.  Sick contacts include grand kids with various URI over the holiday season. Denies cough, sputum production or fever.     PMHX:   Past Medical History  Diagnosis Date  . Thyroid disorder   . Diabetes mellitus     type II  . Thyroid nodule   . Sleep apnea   . Obesity   . Hypertension   . Hyperlipidemia   . Urinary incontinence   . CHF (congestive heart failure) 2005    Dx at [facility]  . Chronic back pain   . COPD (chronic obstructive pulmonary disease)     2L [place]  . Sleep apnea   . Lipoma of back   . Arthritis   . Streptococcal bacteremia   . Acute renal failure   . Stroke   . Sepsis with  metabolic encephalopathy   . Gastritis   . Symptomatic bradycardia    Surgical Hx:  Past Surgical History  Procedure Laterality Date  . Cholecystectomy    . Vaginal hysterectomy    . Cardiac catheterization  2013    @ [place]: No obstructive CAD: Only 20% ostial left circumflex.  . Abdominal hysterectomy  1969  . Cyst removal trunk  2002    BACK  . Lipoma removal  2014    back  . Pacemaker insertion  2015    [place] dual chamber pacemaker implanted by Dr Caryl Comes for symptomatic bradycardia  . Insert / replace / remove pacemaker    . Permanent pacemaker insertion N/A 01/09/2014     Procedure: PERMANENT PACEMAKER INSERTION;  Surgeon: Deboraha Sprang, MD;  Location: [facility] CATH LAB;  Service: Cardiovascular;  Laterality: N/A;   Family Hx:  Family History  Problem Relation Age of Onset  . Heart disease Mother   . Hypertension Mother   . COPD Mother     was a smoker  . Thyroid disease Mother   . Hypertension Father   . Hypertension Sister   . Thyroid disease Sister   . Hypertension Sister   . Thyroid disease Sister   . Breast cancer Maternal Aunt    Social Hx:   History  Substance Use Topics  . Smoking status: Former Smoker -- 0.30 packs/day for 2 years    Types: Cigarettes    Quit date: 06/23/1990  . Smokeless tobacco: Never Used  . Alcohol Use: No   Medication:   Current Outpatient Rx  Name  Route  Sig  Dispense  Refill  . acetaminophen (TYLENOL) 325 MG tablet   Oral   Take 325 mg by mouth every 6 (six) hours as needed for pain.         [name] antiseptic oral rinse (BIOTENE) LIQD   Mouth Rinse   15 mLs by Mouth Rinse route as needed for dry mouth.         . chlorthalidone (HYGROTON) 25 MG tablet   Oral   Take 0.5 tablets (12.5 mg total) by mouth daily.   30 tablet   5   . cyclobenzaprine (FLEXERIL) 10 MG tablet   Oral   Take 1 tablet (10 mg total) by mouth 3 (three) times daily as needed for muscle spasms.   60 tablet   3   . furosemide (LASIX) 20 MG tablet      TAKE ONE TABLET BY MOUTH TWICE DAILY   60 tablet   0   . glipiZIDE (GLUCOTROL) 2.5 mg TABS tablet      2.5 mg once daily with breakfast   30 tablet   3   . meclizine (ANTIVERT) 50 MG tablet   Oral   Take 1 tablet (50 mg total) by mouth 2 (two) times daily as needed for dizziness.   30 tablet   0   . Menthol, Topical Analgesic, (BIOFREEZE EX)   Apply externally   Apply 1 each topically as needed (for pain).         . naproxen (EC NAPROSYN) 500 MG EC tablet   Oral   Take 1 tablet (500 mg total) by mouth 2 (two) times daily as needed.   60 tablet   3   . NON  FORMULARY      Oxygen 3 liters daily.         . NON FORMULARY      CPAP         .  oxyCODONE-acetaminophen (PERCOCET/ROXICET) 5-325 MG per tablet   Oral   Take 0.5 tablets by mouth every 8 (eight) hours as needed for severe pain.         . polyethylene glycol (MIRALAX / GLYCOLAX) packet   Oral   Take 17 g by mouth daily as needed for moderate constipation.         . potassium chloride SA (KLOR-CON M20) 20 MEQ tablet   Oral   Take 1 tablet (20 mEq total) by mouth 2 (two) times daily.   60 tablet   6   . promethazine (PHENERGAN) 25 MG tablet   Oral   Take 25 mg by mouth every 4 (four) hours as needed for nausea.         . Simethicone (GAS-X PO)   Oral   Take 1 tablet by mouth as needed (for gas).          . verapamil (CALAN-SR) 240 MG CR tablet   Oral   Take 1 tablet (240 mg total) by mouth daily.   30 tablet   2      Review of Systems: Gen:  Denies  fever, sweats, chills HEENT: Denies blurred vision, double vision, ear pain, eye pain, hearing loss, nose bleeds, sore throat Cvc:  No dizziness, chest pain or heaviness Resp:   Denies cough or sputum porduction, Admits to sob Gi: Denies swallowing difficulty, stomach pain, nausea or vomiting, diarrhea, constipation, bowel incontinence Gu:  Denies bladder incontinence, burning urine Ext:   No Joint pain. Admits to mild B\L LE swelling  Skin: No skin rash, easy bruising or bleeding or hives Endoc:  No polyuria, polydipsia , polyphagia or weight change Psych: No depression, insomnia or hallucinations  Other:  All other systems negative  Allergies:  Other and Penicillins  Physical Examination:  VS: BP 138/80 mmHg  Pulse 70  Temp(Src) 98.4 F (36.9 C) (Oral)  Ht 5\' 2"  (1.575 m)  Wt 236 lb (107.049 kg)  BMI 43.15 kg/m2  SpO2 97%  General Appearance: No distress  HEENT: PERRLA, EOM intact, no ptosis, no other lesions noticed Pulmonary:Exam: good upper airway sounds, mild dec bs at the bases with fine  crackles.  Cardiovascular:@ Exam:  Normal S1,S2.  No m/r/g.     Abdomen:Exam: Benign, Soft, non-tender, No masses  Skin:   warm, no rashes, no ecchymosis  Extremities: normal, no cyanosis, clubbing, warm with normal capillary refill.  Mild non-pitting edema of b\l LE Labs results:  BMP Lab Results  Component Value Date   NA 132* 06/29/2014   K 3.2* 06/29/2014   CL 94* 06/29/2014   CO2 29 06/29/2014   GLUCOSE 250* 06/29/2014   BUN 19 06/29/2014   CREATININE 0.7 06/29/2014     CBC CBC Latest Ref Rng 06/22/2014 04/11/2014 01/09/2014  WBC 4.0 - 10.5 K/uL 6.5 6.9 6.2  Hemoglobin 12.0 - 15.0 g/dL 11.7(L) 11.7(L) 11.7(L)  Hematocrit 36.0 - 46.0 % 37.0 37.8 37.2  Platelets 150.0 - 400.0 K/uL 245.0 219.0 223     Rad results:  (The following images and results were reviewed by Dr. Stevenson Clinch). CXR 06/22/14 FINDINGS: There is chronic elevation of the right hemidiaphragm. The lungs demonstrate mildly increased interstitial markings. There is no pleural effusion. The cardiac silhouette is top-normal in size. The central pulmonary vascularity is engorged. The permanent pacemaker is in appropriate position radiographically. There is degenerative disc change at multiple thoracic levels.  IMPRESSION: There is low-grade CHF superimposed upon chronic bronchitic changes. Overall the pulmonary interstitial  edema is not as great as that seen on the previous study.    Assessment and Plan: Pulmonary hypertension I think this is most likely WHO class II and 3 disease. No clear indication for a pulmonary vasodilator. Continue to maximize CHF and OSA treatment.    Diffuse Parenchymal Lung Disease I agree with Dr. Lake Bells her differential diagnosis based on previous clinical symptoms and imaging give a differential of pulmonary sarcoidosis, however the differential diagnosis of her diffuse pulmonary parenchymal lung disease is broad and includes chronic hypersensitivity pneumonitis, old infection,  or an old inhalational injury. At this point no need for a biopsy to try to get to the bottom of this as #1 the disease does not appear to have progressed over the last several years and #2 she would not be a very good candidate for prednisone.  Plan: -follow with annual or q6 month PFT -serial 6 MWT    Costochondritis Symptoms consistent with costochondritis  Plan - Ibuprofen 600mg  TID x 3 days - follow up with Cardiology - I have spoken with Dr. Fletcher Anon who knows patient well and follows her closely, he stated to increase her lasix to 2 pills in the AM and 2 pills in the PM, then follow up with in within a week, also make a journal of daily weights.     Updated Medication List Outpatient Encounter Prescriptions as of 07/04/2014  Medication Sig  . acetaminophen (TYLENOL) 325 MG tablet Take 325 mg by mouth every 6 (six) hours as needed for pain.  [name] antiseptic oral rinse (BIOTENE) LIQD 15 mLs by Mouth Rinse route as needed for dry mouth.  . chlorthalidone (HYGROTON) 25 MG tablet Take 0.5 tablets (12.5 mg total) by mouth daily.  . cyclobenzaprine (FLEXERIL) 10 MG tablet Take 1 tablet (10 mg total) by mouth 3 (three) times daily as needed for muscle spasms.  . furosemide (LASIX) 20 MG tablet TAKE ONE TABLET BY MOUTH TWICE DAILY  . glipiZIDE (GLUCOTROL) 2.5 mg TABS tablet 2.5 mg once daily with breakfast  . meclizine (ANTIVERT) 50 MG tablet Take 1 tablet (50 mg total) by mouth 2 (two) times daily as needed for dizziness.  . Menthol, Topical Analgesic, (BIOFREEZE EX) Apply 1 each topically as needed (for pain).  . naproxen (EC NAPROSYN) 500 MG EC tablet Take 1 tablet (500 mg total) by mouth 2 (two) times daily as needed.  . NON FORMULARY Oxygen 3 liters daily.  . NON FORMULARY CPAP  . oxyCODONE-acetaminophen (PERCOCET/ROXICET) 5-325 MG per tablet Take 0.5 tablets by mouth every 8 (eight) hours as needed for severe pain.  . polyethylene glycol (MIRALAX / GLYCOLAX) packet Take 17 g by mouth  daily as needed for moderate constipation.  . potassium chloride SA (KLOR-CON M20) 20 MEQ tablet Take 1 tablet (20 mEq total) by mouth 2 (two) times daily.  . promethazine (PHENERGAN) 25 MG tablet Take 25 mg by mouth every 4 (four) hours as needed for nausea.  . Simethicone (GAS-X PO) Take 1 tablet by mouth as needed (for gas).   . verapamil (CALAN-SR) 240 MG CR tablet Take 1 tablet (240 mg total) by mouth daily.  [name] ibuprofen (ADVIL,MOTRIN) 600 MG tablet Take 1 tablet (600 mg total) by mouth every 8 (eight) hours as needed.    Orders for this visit: No orders of the defined types were placed in this encounter.    Thank  you for the visitation and for allowing  [facility] to assist in the care of  your patient. Our recommendations are noted above.  Please contact us if we can be of further service.  Vilinda Boehringer, MD [facility] Office Number: [phone]

## 2014-07-07 ENCOUNTER — Ambulatory Visit (INDEPENDENT_AMBULATORY_CARE_PROVIDER_SITE_OTHER): Payer: Commercial Managed Care - HMO | Admitting: Cardiovascular Disease

## 2014-07-07 ENCOUNTER — Encounter: Payer: Self-pay | Admitting: Cardiovascular Disease

## 2014-07-07 VITALS — BP 157/84 | HR 75 | Ht 61.0 in | Wt 234.8 lb

## 2014-07-07 DIAGNOSIS — R079 Chest pain, unspecified: Secondary | ICD-10-CM

## 2014-07-07 DIAGNOSIS — I5032 Chronic diastolic (congestive) heart failure: Secondary | ICD-10-CM

## 2014-07-07 DIAGNOSIS — M940 Chondrocostal junction syndrome [Tietze]: Secondary | ICD-10-CM | POA: Diagnosis not present

## 2014-07-07 DIAGNOSIS — I10 Essential (primary) hypertension: Secondary | ICD-10-CM | POA: Diagnosis not present

## 2014-07-07 DIAGNOSIS — R001 Bradycardia, unspecified: Secondary | ICD-10-CM | POA: Diagnosis not present

## 2014-07-07 MED ORDER — FUROSEMIDE 20 MG PO TABS
20.0000 mg | ORAL_TABLET | Freq: Two times a day (BID) | ORAL | Status: DC
Start: 2014-07-07 — End: 2014-08-01

## 2014-07-07 NOTE — Patient Instructions (Signed)
Your physician has recommended you make the following change in your medication:  Decrease Lasix to 20 mg twice daily   Your physician recommends that you schedule a follow-up appointment in:  3 months

## 2014-07-07 NOTE — Progress Notes (Signed)
Primary care physician: Dr. Ronette Deter  HPI  This is a 69 -year-old female who is here today for a followup visit.  She has known history of chronic diastolic heart failure with pulmonary hypertension, bradycardia and chronic atypical chest pain thought to be due to costochondritis. She has chronic left lower extremity swelling which has been evaluated in the past with venous duplex ultrasound on multiple occasions and was negative for DVT. She did have a nonocclusive DVT on ultrasound last year with subsequent IVC filter placement. She likely has lymphedema on the left side as well. Her echocardiogram in 2013 showed normal LV systolic function with mild diastolic dysfunction and evidence of elevated filling pressures. There was also moderate pulmonary hypertension with peak systolic pressure of 56 mm Hg. she underwent cardiac catheterization in August of 2013 which showed no evidence of obstructive coronary artery disease. She was noted to be severely hypertensive with significant elevation in left ventricular end-diastolic pressure.  She was hospitalized in July, 2014 at [facility] for severe chest pain. Cardiac workup was overall negative. CT of the chest showed possible peripheral pulmonary emboli which really did not explain her symptoms. She was anticoagulated which was complicated by subarachnoid hemorrhage and cardiopulmonary arrest. She was transferred to [facility] and was treated conservatively. Anticoagulation was discontinued.[name] She was discharged home but readmitted in August at [facility] with septic shock and acute renal failure requiring brief dialysis. She ultimately improved. Renal function went back to normal.  She continued to have symptomatic bradycardia and underwent a dual-chamber pacemaker placement by Dr. Caryl Comes in [phone]. She had significant improvement in fatigue, dyspnea and dizziness after that. She was seen recently by Dr. Leonidas Romberg and reported worsening chest pain, shortness of breath  and leg edema. She was given ibuprofen and the dose of Lasix was increased. She feels better today.   Allergies  Allergen Reactions  . Other     Blood Thinners-per doctor at [facility]  . Penicillins Hives and Swelling     Current Outpatient Prescriptions on File Prior to Visit  Medication Sig Dispense Refill  . acetaminophen (TYLENOL) 325 MG tablet Take 325 mg by mouth every 6 (six) hours as needed for pain.    [name] antiseptic oral rinse (BIOTENE) LIQD 15 mLs by Mouth Rinse route as needed for dry mouth.    . chlorthalidone (HYGROTON) 25 MG tablet Take 0.5 tablets (12.5 mg total) by mouth daily. 30 tablet 5  . cyclobenzaprine (FLEXERIL) 10 MG tablet Take 1 tablet (10 mg total) by mouth 3 (three) times daily as needed for muscle spasms. 60 tablet 3  . glipiZIDE (GLUCOTROL) 2.5 mg TABS tablet 2.5 mg once daily with breakfast 30 tablet 3  . ibuprofen (ADVIL,MOTRIN) 600 MG tablet Take 1 tablet (600 mg total) by mouth every 8 (eight) hours as needed. 9 tablet 0  . meclizine (ANTIVERT) 50 MG tablet Take 1 tablet (50 mg total) by mouth 2 (two) times daily as needed for dizziness. 30 tablet 0  . Menthol, Topical Analgesic, (BIOFREEZE EX) Apply 1 each topically as needed (for pain).    . naproxen (EC NAPROSYN) 500 MG EC tablet Take 1 tablet (500 mg total) by mouth 2 (two) times daily as needed. 60 tablet 3  . NON FORMULARY Oxygen 3 liters daily.    . NON FORMULARY CPAP    . oxyCODONE-acetaminophen (PERCOCET/ROXICET) 5-325 MG per tablet Take 0.5 tablets by mouth every 8 (eight) hours as needed for severe pain.    . polyethylene glycol (  MIRALAX / GLYCOLAX) packet Take 17 g by mouth daily as needed for moderate constipation.    . potassium chloride SA (KLOR-CON M20) 20 MEQ tablet Take 1 tablet (20 mEq total) by mouth 2 (two) times daily. 60 tablet 6  . promethazine (PHENERGAN) 25 MG tablet Take 25 mg by mouth every 4 (four) hours as needed for nausea.    . Simethicone (GAS-X PO) Take 1 tablet by mouth  as needed (for gas).     . verapamil (CALAN-SR) 240 MG CR tablet Take 1 tablet (240 mg total) by mouth daily. 30 tablet 2   No current facility-administered medications on file prior to visit.     Past Medical History  Diagnosis Date  . Thyroid disorder   . Diabetes mellitus     type II  . Thyroid nodule   . Sleep apnea   . Obesity   . Hypertension   . Hyperlipidemia   . Urinary incontinence   . CHF (congestive heart failure) 2005    Dx at [facility]  . Chronic back pain   . COPD (chronic obstructive pulmonary disease)     2L [place]  . Sleep apnea   . Lipoma of back   . Arthritis   . Streptococcal bacteremia   . Acute renal failure   . Stroke   . Sepsis with metabolic encephalopathy   . Gastritis   . Symptomatic bradycardia      Past Surgical History  Procedure Laterality Date  . Cholecystectomy    . Vaginal hysterectomy    . Cardiac catheterization  2013    @ [place]: No obstructive CAD: Only 20% ostial left circumflex.  . Abdominal hysterectomy  1969  . Cyst removal trunk  2002    BACK  . Lipoma removal  2014    back  . Pacemaker insertion  2015    [place] dual chamber pacemaker implanted by Dr Caryl Comes for symptomatic bradycardia  . Insert / replace / remove pacemaker    . Permanent pacemaker insertion N/A 01/09/2014    Procedure: PERMANENT PACEMAKER INSERTION;  Surgeon: Deboraha Sprang, MD;  Location: [facility] CATH LAB;  Service: Cardiovascular;  Laterality: N/A;     Family History  Problem Relation Age of Onset  . Heart disease Mother   . Hypertension Mother   . COPD Mother     was a smoker  . Thyroid disease Mother   . Hypertension Father   . Hypertension Sister   . Thyroid disease Sister   . Hypertension Sister   . Thyroid disease Sister   . Breast cancer Maternal Aunt      History   Social History  . Marital Status: Divorced    Spouse Name: N/A    Number of Children: N/A  . Years of Education: N/A   Occupational History  . Retired- factory work      exposed to [place]  . Smoking status: Former Smoker -- 0.30 packs/day for 2 years    Types: Cigarettes    Quit date: 06/23/1990  . Smokeless tobacco: Never Used  . Alcohol Use: No  . Drug Use: No  . Sexual Activity: Not on file   Other Topics Concern  . Not on file   Social History Narrative   Lives in [place] with daughter. Has 4 children. No pets.      Work - Restaurant manager, fast food, retired      Diet - regular  PHYSICAL EXAM   BP 157/84 mmHg  Pulse 75  Ht 5\' 1"  (1.549 m)  Wt 234 lb 12.8 oz (106.505 kg)  BMI 44.39 kg/m2 Constitutional: She is oriented to person, place, and time. She appears well-developed and well-nourished. No distress.  HENT: No nasal discharge.  Head: Normocephalic and atraumatic.  Eyes: Pupils are equal and round. Right eye exhibits no discharge. Left eye exhibits no discharge.  Neck: Normal range of motion. Neck supple. No JVD present. No thyromegaly present.  Cardiovascular: Normal rate, regular rhythm, normal heart sounds. Exam reveals no gallop and no friction rub. No murmur heard.  Pulmonary/Chest: Effort normal and breath sounds normal. No stridor. No respiratory distress. She has no wheezes. She has no rales. She exhibits no tenderness.  Abdominal: Soft. Bowel sounds are normal. She exhibits no distension. There is no tenderness. There is no rebound and no guarding.  Musculoskeletal: Normal range of motion. She exhibits chronic left lower extremity edema and no tenderness.  Neurological: She is alert and oriented to person, place, and time. Coordination normal.  Skin: Skin is warm and dry. No rash noted. She is not diaphoretic. No erythema. No pallor.  Psychiatric: She has a normal mood and affect. Her behavior is normal. Judgment and thought content normal.   EKG: Sinus  Rhythm  - occasional ectopic ventricular beat    Low voltage in precordial leads.   -Incomplete left bundle branch block.   -   Nonspecific T-abnormality.   ABNORMAL    ASSESSMENT AND PLAN

## 2014-07-10 NOTE — Assessment & Plan Note (Signed)
This has been a chronic issue for the patient seems to have improved on short course of ibuprofen.

## 2014-07-10 NOTE — Assessment & Plan Note (Signed)
Blood pressure is mildly elevated today. This might be  exacerbated by the recent use of ibuprofen which is only short time.

## 2014-07-10 NOTE — Assessment & Plan Note (Signed)
She had recent fluid overload which improved with doubling the dose of furosemide. She is close to her baseline and I decreased the dose of furosemide to the original dose.

## 2014-07-10 NOTE — Assessment & Plan Note (Signed)
Status post pacemaker placement. She follows at the device clinic.

## 2014-07-14 ENCOUNTER — Ambulatory Visit: Payer: Commercial Managed Care - HMO | Admitting: Cardiovascular Disease

## 2014-07-18 ENCOUNTER — Ambulatory Visit (INDEPENDENT_AMBULATORY_CARE_PROVIDER_SITE_OTHER): Payer: Commercial Managed Care - HMO | Admitting: Internal Medicine

## 2014-07-18 ENCOUNTER — Encounter: Payer: Self-pay | Admitting: Internal Medicine

## 2014-07-18 VITALS — BP 128/82 | HR 62 | Ht 61.0 in | Wt 234.0 lb

## 2014-07-18 DIAGNOSIS — R001 Bradycardia, unspecified: Secondary | ICD-10-CM

## 2014-07-18 LAB — MDC_IDC_ENUM_SESS_TYPE_INCLINIC
Battery Remaining Longevity: 150 mo
Brady Statistic RA Percent Paced: 43 %
Brady Statistic RV Percent Paced: 1 %
Date Time Interrogation Session: 20160126050000
Implantable Pulse Generator Serial Number: 390330
Lead Channel Impedance Value: 440 Ohm
Lead Channel Impedance Value: 772 Ohm
Lead Channel Pacing Threshold Amplitude: 0.4 V
Lead Channel Pacing Threshold Amplitude: 0.6 V
Lead Channel Pacing Threshold Pulse Width: 0.4 ms
Lead Channel Pacing Threshold Pulse Width: 0.4 ms
Lead Channel Sensing Intrinsic Amplitude: 21.2 mV
Lead Channel Sensing Intrinsic Amplitude: 4.4 mV
Lead Channel Setting Pacing Amplitude: 2 V
Lead Channel Setting Pacing Amplitude: 2.4 V
Lead Channel Setting Pacing Pulse Width: 0.4 ms
Lead Channel Setting Sensing Sensitivity: 2.5 mV
Miscellaneous Comment: 8
Zone Setting Detection Interval: 375 ms

## 2014-07-18 MED ORDER — TORSEMIDE 20 MG PO TABS
20.0000 mg | ORAL_TABLET | Freq: Two times a day (BID) | ORAL | Status: DC
Start: 2014-07-18 — End: 2015-10-19

## 2014-07-18 NOTE — Progress Notes (Signed)
Patient Care Team: Jackolyn Confer, MD as PCP - General (Internal Medicine) Thom Chimes, MD as Referring Physician (Allergy) Juanito Doom, MD as Referring Physician (Internal Medicine)   HPI  Debra Saunders is a 69 y.o. 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   She says she is much better with less dyspnea.  She is tolerating the verapamil apart from constipation.   She has known left ventricular elevated diastolic pressures about 10 years ago. There has been interval 50 pound weight gain which amounts to about 20% of her body weight. She is now using oxygen all the time and is limited to about 100 -150 feet.   She has significant asymmetric edema. She has a history of blood clot in her left leg. She history of intracranial hemorrhage while on anticoagulation and is no longer considered a candidate for same  a filter was placed for her DVT   CT scan is reviewed from [facility] June 2015 demonstrated no pulmonary clots. Past Medical History  Diagnosis Date  . Thyroid disorder   . Diabetes mellitus     type II  . Thyroid nodule   . Sleep apnea   . Obesity   . Hypertension   . Hyperlipidemia   . Urinary incontinence   . CHF (congestive heart failure) 2005    Dx at [facility]  . Chronic back pain   . COPD (chronic obstructive pulmonary disease)     2L [place]  . Sleep apnea   . Lipoma of back   . Arthritis   . Streptococcal bacteremia   . Acute renal failure   . Stroke   . Sepsis with metabolic encephalopathy   . Gastritis   . Symptomatic bradycardia     Past Surgical History  Procedure Laterality Date  . Cholecystectomy    . Vaginal hysterectomy    . Cardiac catheterization  2013    @ [place]: No obstructive CAD: Only 20% ostial left circumflex.  . Abdominal hysterectomy  1969  . Cyst removal trunk  2002    BACK  . Lipoma removal  2014    back  . Pacemaker insertion  2015    [place] dual  chamber pacemaker implanted by Dr Caryl Comes for symptomatic bradycardia  . Insert / replace / remove pacemaker    . Permanent pacemaker insertion N/A 01/09/2014    Procedure: PERMANENT PACEMAKER INSERTION;  Surgeon: Deboraha Sprang, MD;  Location: [facility] CATH LAB;  Service: Cardiovascular;  Laterality: N/A;    Current Outpatient Prescriptions  Medication Sig Dispense Refill  . acetaminophen (TYLENOL) 325 MG tablet Take 325 mg by mouth every 6 (six) hours as needed for pain.    [name] antiseptic oral rinse (BIOTENE) LIQD 15 mLs by Mouth Rinse route as needed for dry mouth.    . chlorthalidone (HYGROTON) 25 MG tablet Take 0.5 tablets (12.5 mg total) by mouth daily. 30 tablet 5  . cyclobenzaprine (FLEXERIL) 10 MG tablet Take 1 tablet (10 mg total) by mouth 3 (three) times daily as needed for muscle spasms. 60 tablet 3  . furosemide (LASIX) 20 MG tablet Take 1 tablet (20 mg total) by mouth 2 (two) times daily. 60 tablet 3  . glipiZIDE (GLUCOTROL) 2.5 mg TABS tablet 2.5 mg once daily with breakfast 30 tablet 3  . ibuprofen (ADVIL,MOTRIN) 600 MG tablet Take 1 tablet (600 mg total) by mouth every 8 (eight) hours as needed. 9 tablet 0  .  meclizine (ANTIVERT) 50 MG tablet Take 1 tablet (50 mg total) by mouth 2 (two) times daily as needed for dizziness. 30 tablet 0  . Menthol, Topical Analgesic, (BIOFREEZE EX) Apply 1 each topically as needed (for pain).    . naproxen (EC NAPROSYN) 500 MG EC tablet Take 1 tablet (500 mg total) by mouth 2 (two) times daily as needed. 60 tablet 3  . NON FORMULARY Oxygen 3 liters daily.    . NON FORMULARY CPAP    . oxyCODONE-acetaminophen (PERCOCET/ROXICET) 5-325 MG per tablet Take 0.5 tablets by mouth every 8 (eight) hours as needed for severe pain.    . polyethylene glycol (MIRALAX / GLYCOLAX) packet Take 17 g by mouth daily as needed for moderate constipation.    . potassium chloride SA (KLOR-CON M20) 20 MEQ tablet Take 1 tablet (20 mEq total) by mouth 2 (two) times daily. 60 tablet 6    . promethazine (PHENERGAN) 25 MG tablet Take 25 mg by mouth every 4 (four) hours as needed for nausea.    . Simethicone (GAS-X PO) Take 1 tablet by mouth as needed (for gas).     . verapamil (CALAN-SR) 240 MG CR tablet Take 1 tablet (240 mg total) by mouth daily. 30 tablet 2   No current facility-administered medications for this visit.    Allergies  Allergen Reactions  . Other     Blood Thinners-per doctor at [facility]  . Penicillins Hives and Swelling    Review of Systems negative except from HPI and PMH  Physical Exam BP 128/82 mmHg  Pulse 62  Ht 5\' 1"  (1.549 m)  Wt 234 lb (106.142 kg)  BMI 44.24 kg/m2 Well developed and well nourished in no acute distress wearing oxygen HENT normal E scleral and icterus clear Neck Supple Device pocket well healed; without hematoma or erythema.  There is no tethering Clear to ausculation Irregular  rate and rhythm, 2/6 systolic murmur Soft with active bowel sounds No clubbing cyanosis tr  Edema Alert and oriented, grossly normal motor and sensory function Skin Warm and Dry  ECG demonstrates sinus rhythm at 54 Interval 17/09/35 Nonspecific ST changes     Assessment and  Plan  Chronotropic incompetence  Sinus bradycardia  Atrial tachycardia  Dyspnea on exertion  Secondary pulmonary hypertension  HFpEF  Hypertension  Hx of intracranial hemorrhage  Functional status is stable. There is less rapid ventricular response with her atrial tachycardia; the verapamil has been effective in this regard. She also developed constipation as a consequence but his address this with prunes.  With her volume overload (albeit mild) with heart failure in the context of pulmonary hypertension, torsemide might be more effective diuretic and a combinations of chlorthalidone and furosemide that she is currently taking. We will make the switch. She is to see Dr. Gilford Rile in one week's time and I will defer to him long-term management of her  diuretic  Heart rate excursion is adequate.

## 2014-07-18 NOTE — Patient Instructions (Signed)
Your physician has recommended you make the following change in your medication: Stop Chlorthalidone  Start Torsemide 20 mg twice daily   Your physician wants you to follow-up in: 6 months wit Dr. Caryl Comes. You will receive a reminder letter in the mail two months in advance. If you don't receive a letter, please call our office to schedule the follow-up appointment.  Remote monitoring is used to monitor your Pacemaker of ICD from home. This monitoring reduces the number of office visits required to check your device to one time per year. It allows [place] to keep an eye on the functioning of your device to ensure it is working properly. You are scheduled for a device check from home on 10/17/14. You may send your transmission at any time that day. If you have a wireless device, the transmission will be sent automatically. After your physician reviews your transmission, you will receive a postcard with your next transmission date.

## 2014-07-21 NOTE — Telephone Encounter (Signed)
Referral sent to [facility] for evaluation w/in 2-3 days.  Pt will be contacted by their facility.

## 2014-07-21 NOTE — Telephone Encounter (Signed)
Yes, that's fine 

## 2014-07-27 ENCOUNTER — Ambulatory Visit (INDEPENDENT_AMBULATORY_CARE_PROVIDER_SITE_OTHER): Payer: Commercial Managed Care - HMO | Admitting: Pulmonary Disease

## 2014-07-27 ENCOUNTER — Encounter: Payer: Self-pay | Admitting: Pulmonary Disease

## 2014-07-27 ENCOUNTER — Encounter: Payer: Self-pay | Admitting: Internal Medicine

## 2014-07-27 VITALS — BP 128/66 | HR 80 | Ht 62.0 in | Wt 234.0 lb

## 2014-07-27 DIAGNOSIS — I270 Primary pulmonary hypertension: Secondary | ICD-10-CM | POA: Diagnosis not present

## 2014-07-27 DIAGNOSIS — Z5181 Encounter for therapeutic drug level monitoring: Secondary | ICD-10-CM | POA: Diagnosis not present

## 2014-07-27 DIAGNOSIS — I272 Pulmonary hypertension, unspecified: Secondary | ICD-10-CM

## 2014-07-27 DIAGNOSIS — J9611 Chronic respiratory failure with hypoxia: Secondary | ICD-10-CM

## 2014-07-27 DIAGNOSIS — J8410 Pulmonary fibrosis, unspecified: Secondary | ICD-10-CM

## 2014-07-27 DIAGNOSIS — G4733 Obstructive sleep apnea (adult) (pediatric): Secondary | ICD-10-CM | POA: Diagnosis not present

## 2014-07-27 MED ORDER — AMBRISENTAN 5 MG PO TABS: 5.0000 mg | ORAL_TABLET | Freq: Every day | ORAL | Status: DC

## 2014-07-27 NOTE — Assessment & Plan Note (Signed)
Continue using CPAP with oxygen at night

## 2014-07-27 NOTE — Assessment & Plan Note (Addendum)
Since her last visit Debra Saunders has experienced continued shortness of breath.   She has many reasons to be dyspneic including her diastolic heart failure, obesity, and deconditioning. She does appear euvolemic on exam today though it is a difficult exam with her obesity. She has been diuresed aggressively recently.  Though she has many reasons to have pulmonary hypertension and it's unlikely she has pure WHO class I disease I feel compelled to treat because of progressive dyspnea.  Plan:  -After lengthy discussion we have decided to start [place].  Among patients with underlying sarcoid (again, I believe this is likely with her) the endothelin receptor antagonists have the best evidence of efficacy -Educated at length on the side effect profile of the medication, started 5 mg daily -Continue diuretics as written with chlorthalidone and Lasix, she prefers to use these as opposed to the torsemide -Follow-up 6-8 weeks

## 2014-07-27 NOTE — Progress Notes (Signed)
Subjective:    Patient ID: Debra Saunders, female    DOB: 1945/12/29, 69 y.o.   MRN: [id number]  Synopsis: Debra Saunders is a very pleasant female with obstructive sleep apnea, obesity, diastolic dysfunction, and pulmonary hypertension who is referred to the [facility] pulmonary clinic in November 2013 for evaluation of pulmonary hypertension. In 2005 she established care with Dr. Thom Chimes at [facility] for evaluation of the same. Dr. Gaynell Face felt that her pulmonary hypertension was do to her congestive heart failure, obesity, and obstructive sleep apnea. Unfortunately she lost her insurance around 2008 and did not have regular pulmonary visits. During the interval from 2008 2013 her shortness of breath increased and she gained about 50 pounds. She had a left heart catheterization in 2013 which showed very high filling pressures of the left ventricle as well as high systemic pressures but normal coronary arteries. In November 2013 her simple spirometry was completely normal. In 2014 she had a subarachnoid bleed and cardiac arrest and amazingly survived.  She had an IVC filter placed for PE.  HPI Chief Complaint  Patient presents with  . Follow-up    Pt c/o sob no better since seeing VM last month.  Some chest congestion qhs.    Since the last visit [name] has seen several folks.  She has been treated with increased dose of diuretic including chlorthalidone and lasix three times a day.  However even though she had improved edema she has not had improved breathing.  She feels the dyspnea has worsened .  she is concerned about the side effect profile of the torsemide. She says that she feels dyspnea with minimal activity around the house. She has been diuresing well and her leg swelling has improved significantly. She does not have cough, mucus production or chest pain. She denies fevers or chills. Since diuresing more aggressively earlier in the month her weight has been  roughly stable in the last 2-3 weeks. She continues to use and benefit from her oxygen.  Past Medical History  Diagnosis Date  . Thyroid disorder   . Diabetes mellitus     type II  . Thyroid nodule   . Sleep apnea   . Obesity   . Hypertension   . Hyperlipidemia   . Urinary incontinence   . CHF (congestive heart failure) 2005    Dx at [facility]  . Chronic back pain   . COPD (chronic obstructive pulmonary disease)     2L [place]  . Sleep apnea   . Lipoma of back   . Arthritis   . Streptococcal bacteremia   . Acute renal failure   . Stroke   . Sepsis with metabolic encephalopathy   . Gastritis   . Symptomatic bradycardia      Review of Systems  Constitutional: Negative for fever, chills, activity change and fatigue.  HENT: Negative for congestion, postnasal drip and rhinorrhea.   Respiratory: Positive for shortness of breath. Negative for cough, chest tightness and wheezing.   Cardiovascular: Positive for leg swelling. Negative for chest pain and palpitations.       Objective:   Physical Exam Filed Vitals:   07/27/14 1435  BP: 128/66  Pulse: 80  Height: 5\' 2"  (1.575 m)  Weight: 234 lb (106.142 kg)  SpO2: 100%  3L [place]  Gen: chronically ill appearing but in no acute distress HEENT: NCAT, , EOMi,  PULM: Crackles right base more than left CV: regular with PAC, no mgr, no JVD AB: BS+,  soft, nontender,  Ext: warm, leg edema left leg, pitting, no redness or swelling  2004 [facility]: right heart catheterization from 10/25/02 revealed a PA pressure of 62/25 (mean 36) and a mean PA pressure decreased remarkably to 20 during this catheterization. A PCWP was normal at 6 and a cardiac index of 2.42 L/m/m2 with an initial increase in vasodilator therapy to 2.71 L/m/m2 2004 6MW 313 meters (1,027 feet) On oxygen in 2004 04/22/2012 6 minute walk>> total distance walked 950 feet, heart rate ranged between 56 and 88, O2 saturation ranged between 91 and 98% on 2 L nasal cannula 11-26 Spirometry >>  no obstruction, FEV1 1.42 L (70%) 11-26 6MW >> 795 Ft, O2 saturation 90% lowest on 2L 07/2012 6MW >> 1115 feet, O2 saturation 97% 2LNC 03/2014 CT chest > Upper lobe and perihilar predominant ground glass and reticular coarsening and traction bronchiectasis and architectural distortion suggesting postinflammatory fibrosis.  Sarcoid considered considering hilar lymph nodes, enlarged pulmonary arteries indicative of pulmonary arterial hypertension  The most recent cardiology clinic notes as well as pulmonary notes were reviewed today in clinic. Also took time to re-review the notes from [name] in clinic today.    Assessment & Plan:   Pulmonary hypertension Since her last visit [place] has experienced continued shortness of breath.   She has many reasons to be dyspneic including her diastolic heart failure, obesity, and deconditioning. She does appear euvolemic on exam today though it is a difficult exam with her obesity. She has been diuresed aggressively recently.  Though she has many reasons to have pulmonary hypertension and it's unlikely she has pure WHO class I disease I feel compelled to treat because of progressive dyspnea.  Plan:  -After lengthy discussion we have decided to start [place].  Among patients with underlying sarcoid (again, I believe this is likely with her) the endothelin receptor antagonists have the best evidence of efficacy -Educated at length on the side effect profile of the medication, started 5 mg daily -Continue diuretics as written with chlorthalidone and Lasix, she prefers to use these as opposed to the torsemide -Follow-up 6-8 weeks    OSA (obstructive sleep apnea) Continue using CPAP with oxygen at night   Chronic respiratory failure with hypoxia Continue 3 L of oxygen continuously   Diffuse Parenchymal Lung Disease As noted previously, she has evidence of a diffuse parenchymal lung disease. This is been documented since early the 2000s when she was  first evaluated at [facility]. Considering her demographic in the calcified mediastinal lymph nodes are think this is likely represent sarcoid. We discussed treating with prednisone today but she prefers to avoid this because she has used it in the past and it is cause severe side effects. Further, there has not been significant progression in the pulmonary parenchymal abnormalities over the years and so the likelihood of a steroid benefit would be quite low. Also, and the case of sarcoid associated pulmonary hypertension there is no clear evidence that immunosuppression effectively improves pulmonary vascular resistance.     Updated Medication List Outpatient Encounter Prescriptions as of 07/27/2014  Medication Sig  . acetaminophen (TYLENOL) 325 MG tablet Take 325 mg by mouth every 6 (six) hours as needed for pain.  [name] antiseptic oral rinse (BIOTENE) LIQD 15 mLs by Mouth Rinse route as needed for dry mouth.  . cyclobenzaprine (FLEXERIL) 10 MG tablet Take 1 tablet (10 mg total) by mouth 3 (three) times daily as needed for muscle spasms.  . furosemide (LASIX) 20 MG tablet Take 1  tablet (20 mg total) by mouth 2 (two) times daily.  [name] glipiZIDE (GLUCOTROL) 2.5 mg TABS tablet 2.5 mg once daily with breakfast  . ibuprofen (ADVIL,MOTRIN) 600 MG tablet Take 1 tablet (600 mg total) by mouth every 8 (eight) hours as needed.  . meclizine (ANTIVERT) 50 MG tablet Take 1 tablet (50 mg total) by mouth 2 (two) times daily as needed for dizziness.  . Menthol, Topical Analgesic, (BIOFREEZE EX) Apply 1 each topically as needed (for pain).  . naproxen (EC NAPROSYN) 500 MG EC tablet Take 1 tablet (500 mg total) by mouth 2 (two) times daily as needed.  . NON FORMULARY Oxygen 3 liters daily.  . NON FORMULARY CPAP  . oxyCODONE-acetaminophen (PERCOCET/ROXICET) 5-325 MG per tablet Take 0.5 tablets by mouth every 8 (eight) hours as needed for severe pain.  . polyethylene glycol (MIRALAX / GLYCOLAX) packet Take 17 g by mouth daily  as needed for moderate constipation.  . potassium chloride SA (KLOR-CON M20) 20 MEQ tablet Take 1 tablet (20 mEq total) by mouth 2 (two) times daily.  . promethazine (PHENERGAN) 25 MG tablet Take 25 mg by mouth every 4 (four) hours as needed for nausea.  . Simethicone (GAS-X PO) Take 1 tablet by mouth as needed (for gas).   . torsemide (DEMADEX) 20 MG tablet Take 1 tablet (20 mg total) by mouth 2 (two) times daily.  . verapamil (CALAN-SR) 240 MG CR tablet Take 1 tablet (240 mg total) by mouth daily.  [name] ambrisentan (LETAIRIS) 5 MG tablet Take 1 tablet (5 mg total) by mouth daily.

## 2014-07-27 NOTE — Assessment & Plan Note (Signed)
As noted previously, she has evidence of a diffuse parenchymal lung disease. This is been documented since early the 2000s when she was first evaluated at [facility]. Considering her demographic in the calcified mediastinal lymph nodes are think this is likely represent sarcoid. We discussed treating with prednisone today but she prefers to avoid this because she has used it in the past and it is cause severe side effects. Further, there has not been significant progression in the pulmonary parenchymal abnormalities over the years and so the likelihood of a steroid benefit would be quite low. Also, and the case of sarcoid associated pulmonary hypertension there is no clear evidence that immunosuppression effectively improves pulmonary vascular resistance.

## 2014-07-27 NOTE — Assessment & Plan Note (Signed)
Continue 3 L of oxygen continuously 

## 2014-07-27 NOTE — Patient Instructions (Addendum)
We will start the approval process for Letairis, when it arrives, take 5mg  daily You will need to have your liver function monitored when taking this medication Keep taking your medications as you are doing We will see you back in 8 weeks or sooner if needed

## 2014-07-28 ENCOUNTER — Encounter: Payer: Self-pay | Admitting: Internal Medicine

## 2014-07-28 ENCOUNTER — Ambulatory Visit (INDEPENDENT_AMBULATORY_CARE_PROVIDER_SITE_OTHER): Payer: Commercial Managed Care - HMO | Admitting: Internal Medicine

## 2014-07-28 VITALS — BP 118/74 | HR 66 | Temp 98.4°F | Ht 61.0 in | Wt 234.0 lb

## 2014-07-28 DIAGNOSIS — I270 Primary pulmonary hypertension: Secondary | ICD-10-CM | POA: Diagnosis not present

## 2014-07-28 DIAGNOSIS — I10 Essential (primary) hypertension: Secondary | ICD-10-CM

## 2014-07-28 DIAGNOSIS — E119 Type 2 diabetes mellitus without complications: Secondary | ICD-10-CM

## 2014-07-28 DIAGNOSIS — R109 Unspecified abdominal pain: Secondary | ICD-10-CM

## 2014-07-28 DIAGNOSIS — I272 Pulmonary hypertension, unspecified: Secondary | ICD-10-CM

## 2014-07-28 LAB — POCT URINALYSIS DIPSTICK
Bilirubin, UA: NEGATIVE
Blood, UA: NEGATIVE
Glucose, UA: NEGATIVE
Leukocytes, UA: NEGATIVE
Nitrite, UA: NEGATIVE
Protein, UA: NEGATIVE
Spec Grav, UA: 1.015
Urobilinogen, UA: 0.2
pH, UA: 5

## 2014-07-28 NOTE — Progress Notes (Signed)
Subjective:    Patient ID: Debra Saunders, female    DOB: 13-Oct-1945, 69 y.o.   MRN: [id number]  HPI 69YO female presents for follow up.  Last seen 1/7 with progressive dyspnea, fatigue.  Followed up with Dr. Lake Bells 2/04 and started on Ambristentan to treat pulmonary hypertension.  Continues to have shortness of breath and feels tired.  Having some pain in right flank over last 2 weeks.Pain described as sharp pain that comes and goes. Pain improved some, but continues to have dull pain. No urinary symptoms.  Had subjective fever and diarrhea last week. No blood in stool. This has resolved. No known sick contacts.   DM - BG have been less than 200 at home.  Wt Readings from Last 3 Encounters:  07/28/14 234 lb (106.142 kg)  07/27/14 234 lb (106.142 kg)  07/18/14 234 lb (106.142 kg)    Past medical, surgical, family and social history per today's encounter.  Review of Systems  Constitutional: Negative for fever, chills, appetite change, fatigue and unexpected weight change.  Eyes: Negative for visual disturbance.  Respiratory: Positive for shortness of breath.   Cardiovascular: Positive for chest pain (anterior chronic chest pain). Negative for leg swelling.  Gastrointestinal: Positive for diarrhea. Negative for nausea, vomiting, abdominal pain and constipation.  Genitourinary: Positive for flank pain. Negative for dysuria, urgency, frequency and hematuria.  Musculoskeletal: Positive for myalgias.  Skin: Negative for color change and rash.  Hematological: Negative for adenopathy. Does not bruise/bleed easily.  Psychiatric/Behavioral: Negative for dysphoric mood. The patient is not nervous/anxious.        Objective:    BP 118/74 mmHg  Pulse 66  Temp(Src) 98.4 F (36.9 C) (Oral)  Ht 5\' 1"  (1.549 m)  Wt 234 lb (106.142 kg)  BMI 44.24 kg/m2  SpO2 99% Physical Exam  Constitutional: She is oriented to person, place, and time. She appears well-developed and well-nourished.  No distress.  HENT:  Head: Normocephalic and atraumatic.  Right Ear: External ear normal.  Left Ear: External ear normal.  Nose: Nose normal.  Mouth/Throat: Oropharynx is clear and moist. No oropharyngeal exudate.  Eyes: Conjunctivae are normal. Pupils are equal, round, and reactive to light. Right eye exhibits no discharge. Left eye exhibits no discharge. No scleral icterus.  Neck: Normal range of motion. Neck supple. No tracheal deviation present. No thyromegaly present.  Cardiovascular: Normal rate, regular rhythm, normal heart sounds and intact distal pulses.  Exam reveals no gallop and no friction rub.   No murmur heard. Pulmonary/Chest: Effort normal and breath sounds normal. No respiratory distress. She has no wheezes. She has no rales. She exhibits no tenderness.  Abdominal: There is tenderness.    Musculoskeletal: Normal range of motion. She exhibits no edema or tenderness.  Lymphadenopathy:    She has no cervical adenopathy.  Neurological: She is alert and oriented to person, place, and time. No cranial nerve deficit. She exhibits normal muscle tone. Coordination normal.  Skin: Skin is warm and dry. No rash noted. She is not diaphoretic. No erythema. No pallor.  Psychiatric: She has a normal mood and affect. Her behavior is normal. Judgment and thought content normal.          Assessment & Plan:   Problem List Items Addressed This Visit      Unprioritized   Diabetes mellitus type 2, controlled    BG have been well controlled by report. Will continue current medications. Recheck A1c next visit.      Hypertension - Primary  BP Readings from Last 3 Encounters:  07/28/14 118/74  07/27/14 128/66  07/18/14 128/82   BP well controlled. Continue current medications      Pulmonary hypertension    Reviewed notes from Dr. [place] Bells. Starting on Ambrisentan once approved. Will follow.      Right flank pain    Superficial right flank pain. UA normal. Tenderness on exam  to very light palpation. No visible or palpable abnormalities. Question if this may be early shingles. Pt daughter will monitor carefully for skin changes. If vesicles appear, we discussed adding Valtrex. Follow up 4 weeks and prn.      Relevant Orders   POCT Urinalysis Dipstick (Completed)       Return in about 4 weeks (around 08/25/2014) for Recheck of Diabetes.

## 2014-07-28 NOTE — Assessment & Plan Note (Signed)
Superficial right flank pain. UA normal. Tenderness on exam to very light palpation. No visible or palpable abnormalities. Question if this may be early shingles. Pt daughter will monitor carefully for skin changes. If vesicles appear, we discussed adding Valtrex. Follow up 4 weeks and prn.

## 2014-07-28 NOTE — Assessment & Plan Note (Signed)
BG have been well controlled by report. Will continue current medications. Recheck A1c next visit.

## 2014-07-28 NOTE — Assessment & Plan Note (Signed)
Reviewed notes from Dr. Lake Bells. Starting on Ambrisentan once approved. Will follow.

## 2014-07-28 NOTE — Progress Notes (Signed)
Pre visit review using our clinic review tool, if applicable. No additional management support is needed unless otherwise documented below in the visit note. 

## 2014-07-28 NOTE — Patient Instructions (Signed)
Follow-up in 4 weeks

## 2014-07-28 NOTE — Assessment & Plan Note (Signed)
BP Readings from Last 3 Encounters:  07/28/14 118/74  07/27/14 128/66  07/18/14 128/82   BP well controlled. Continue current medications

## 2014-07-31 ENCOUNTER — Telehealth: Payer: Self-pay

## 2014-07-31 NOTE — Telephone Encounter (Signed)
Pt has some questions regarding her medications. Which ones to stop, states Dr. Caryl Comes told her last week to stop some, and she is confused about which ones to stop. Please call and advise.

## 2014-07-31 NOTE — Telephone Encounter (Signed)
Patient stated at her last visit she thought Dr. Caryl Comes her he was going to stop her Lasix and start Torsemide When she got her papers it said stop chlorthalidone..... Not lasix  She wants to clarify before she changes her medications

## 2014-08-01 ENCOUNTER — Encounter: Payer: Self-pay | Admitting: Internal Medicine

## 2014-08-01 NOTE — Telephone Encounter (Signed)
Informed patient of Dr. Aquilla Hacker response  Patient verbalized understanding  She will stop Chlorthalidone and Lasix and start Torsemide 20 mg once daily

## 2014-08-01 NOTE — Telephone Encounter (Signed)
She was right  She was to stop chlorthalidone and lasix and start torsemide 20 mg daily

## 2014-08-03 ENCOUNTER — Telehealth: Payer: Self-pay | Admitting: Pulmonary Disease

## 2014-08-03 NOTE — Telephone Encounter (Signed)
lmomtcb x1 

## 2014-08-04 NOTE — Telephone Encounter (Signed)
Left message for [name] to call back x2

## 2014-08-07 DIAGNOSIS — R569 Unspecified convulsions: Secondary | ICD-10-CM | POA: Diagnosis not present

## 2014-08-07 DIAGNOSIS — G4489 Other headache syndrome: Secondary | ICD-10-CM | POA: Diagnosis not present

## 2014-08-07 DIAGNOSIS — R42 Dizziness and giddiness: Secondary | ICD-10-CM | POA: Diagnosis not present

## 2014-08-07 DIAGNOSIS — I69998 Other sequelae following unspecified cerebrovascular disease: Secondary | ICD-10-CM | POA: Diagnosis not present

## 2014-08-07 NOTE — Telephone Encounter (Signed)
LMTCB for Ashley x 3.

## 2014-08-08 NOTE — Telephone Encounter (Signed)
Spoke with [name] with [place] that she received forms for [employer] and they were unable to use these forms. New forms have been faxed and [name] states that she was advised by someone in our office 08/04/14 that these were received and placed in Dr [place] Bells look at to be signed.   Will send to [facility] to check status of forms.

## 2014-08-10 NOTE — Telephone Encounter (Signed)
These have been signed and faxed back

## 2014-08-10 NOTE — Telephone Encounter (Signed)
What's the status of this? Thanks! 

## 2014-08-15 NOTE — Telephone Encounter (Signed)
Pt has been approved for [employer].  Nothing further needed at this time.

## 2014-09-01 ENCOUNTER — Encounter: Payer: Self-pay | Admitting: Internal Medicine

## 2014-09-01 ENCOUNTER — Other Ambulatory Visit: Payer: Self-pay | Admitting: *Deleted

## 2014-09-01 ENCOUNTER — Telehealth: Payer: Self-pay | Admitting: *Deleted

## 2014-09-01 ENCOUNTER — Ambulatory Visit (INDEPENDENT_AMBULATORY_CARE_PROVIDER_SITE_OTHER): Payer: Commercial Managed Care - HMO | Admitting: Internal Medicine

## 2014-09-01 VITALS — BP 120/62 | HR 61 | Resp 14 | Ht 61.0 in | Wt 236.0 lb

## 2014-09-01 DIAGNOSIS — M940 Chondrocostal junction syndrome [Tietze]: Secondary | ICD-10-CM | POA: Diagnosis not present

## 2014-09-01 DIAGNOSIS — Z1239 Encounter for other screening for malignant neoplasm of breast: Secondary | ICD-10-CM

## 2014-09-01 DIAGNOSIS — E119 Type 2 diabetes mellitus without complications: Secondary | ICD-10-CM

## 2014-09-01 DIAGNOSIS — R42 Dizziness and giddiness: Secondary | ICD-10-CM | POA: Diagnosis not present

## 2014-09-01 LAB — CBC WITH DIFFERENTIAL/PLATELET
Basophils Absolute: 0 10*3/uL (ref 0.0–0.1)
Basophils Relative: 0.4 % (ref 0.0–3.0)
Eosinophils Absolute: 0.2 10*3/uL (ref 0.0–0.7)
Eosinophils Relative: 3.1 % (ref 0.0–5.0)
HCT: 36.2 % (ref 36.0–46.0)
Hemoglobin: 11.6 g/dL — ABNORMAL LOW (ref 12.0–15.0)
Lymphocytes Relative: 24.7 % (ref 12.0–46.0)
Lymphs Abs: 1.8 10*3/uL (ref 0.7–4.0)
MCHC: 32.2 g/dL (ref 30.0–36.0)
MCV: 83.7 fl (ref 78.0–100.0)
Monocytes Absolute: 0.4 10*3/uL (ref 0.1–1.0)
Monocytes Relative: 6 % (ref 3.0–12.0)
Neutro Abs: 4.7 10*3/uL (ref 1.4–7.7)
Neutrophils Relative %: 65.8 % (ref 43.0–77.0)
Platelets: 244 10*3/uL (ref 150.0–400.0)
RBC: 4.32 Mil/uL (ref 3.87–5.11)
RDW: 13.9 % (ref 11.5–15.5)
WBC: 7.2 10*3/uL (ref 4.0–10.5)

## 2014-09-01 LAB — VITAMIN B12: Vitamin B-12: 266 pg/mL (ref 211–911)

## 2014-09-01 LAB — LIPID PANEL
Cholesterol: 166 mg/dL (ref 0–200)
HDL: 56.8 mg/dL (ref >39.00)
LDL Cholesterol: 83 mg/dL (ref 0–99)
NonHDL: 109.2
Total CHOL/HDL Ratio: 3
Triglycerides: 132 mg/dL (ref 0.0–149.0)
VLDL: 26.4 mg/dL (ref 0.0–40.0)

## 2014-09-01 LAB — COMPREHENSIVE METABOLIC PANEL
ALT: 48 U/L — ABNORMAL HIGH (ref 0–35)
AST: 69 U/L — ABNORMAL HIGH (ref 0–37)
Albumin: 3.9 g/dL (ref 3.5–5.2)
Alkaline Phosphatase: 125 U/L — ABNORMAL HIGH (ref 39–117)
BUN: 16 mg/dL (ref 6–23)
CO2: 34 mEq/L — ABNORMAL HIGH (ref 19–32)
Calcium: 9.8 mg/dL (ref 8.4–10.5)
Chloride: 97 mEq/L (ref 96–112)
Creatinine, Ser: 0.72 mg/dL (ref 0.40–1.20)
GFR: 102.64 mL/min (ref >60.00)
Glucose, Bld: 246 mg/dL — ABNORMAL HIGH (ref 70–99)
Potassium: 3.2 mEq/L — ABNORMAL LOW (ref 3.5–5.1)
Sodium: 139 mEq/L (ref 135–145)
Total Bilirubin: 0.5 mg/dL (ref 0.2–1.2)
Total Protein: 7.6 g/dL (ref 6.0–8.3)

## 2014-09-01 LAB — HEMOGLOBIN A1C: Hgb A1c MFr Bld: 8.9 % — ABNORMAL HIGH (ref 4.6–6.5)

## 2014-09-01 LAB — TSH: TSH: 1.54 u[IU]/mL (ref 0.35–4.50)

## 2014-09-01 MED ORDER — VERAPAMIL HCL ER 240 MG PO TBCR: 240.0000 mg | EXTENDED_RELEASE_TABLET | Freq: Every day | ORAL | Status: DC

## 2014-09-01 MED ORDER — GLIPIZIDE 5 MG PO TABS: 2.5000 mg | ORAL_TABLET | Freq: Every day | ORAL | Status: DC

## 2014-09-01 NOTE — Telephone Encounter (Signed)
Refill request  glipiZIDE (GLUCOTROL) 2.5 mg TABS tablet and verapamil (CALAN-SR) 240 MG CR tablet

## 2014-09-01 NOTE — Progress Notes (Signed)
Pre visit review using our clinic review tool, if applicable. No additional management support is needed unless otherwise documented below in the visit note. 

## 2014-09-01 NOTE — Assessment & Plan Note (Signed)
Recent episodes of lightheadedness. Likely multifactorial with recent elevated BG, potential episodic hypotension on medication, use of sedating medication. Will set up carotid dopplers. Will check CBC, CMP, TSH, B12 with labs.

## 2014-09-01 NOTE — Assessment & Plan Note (Signed)
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 Will continue prn  Oxycodone for severe pain.

## 2014-09-01 NOTE — Progress Notes (Signed)
Subjective:    Patient ID: Debra Saunders, female    DOB: 02/25/1946, 69 y.o.   MRN: [id number]  HPI  69YO female presents for follow up.  Having some episodes of dizziness in the mornings. BP and BG have been normal during those times. Episodes come and go without intervention. Occurs when seated or standing. No other focal symptoms during these episodes. No syncope.   DM - BG have been running closer to 200 and up. Compliant with medications. No low BG<70.  Continues to have some intermittent chest wall pain. Taking Naprosyn or Tylenol with improvement. Has not recently taken Oxycodone.   Past medical, surgical, family and social history per today's encounter.  Review of Systems  Constitutional: Positive for fatigue. Negative for fever, chills, appetite change and unexpected weight change.  Eyes: Negative for visual disturbance.  Respiratory: Positive for shortness of breath (chronic). Negative for wheezing.   Cardiovascular: Positive for chest pain (anterior chest wall pain). Negative for palpitations and leg swelling.  Gastrointestinal: Negative for nausea, vomiting, abdominal pain, diarrhea and constipation.  Musculoskeletal: Positive for myalgias and arthralgias.  Skin: Negative for color change and rash.  Neurological: Positive for dizziness and light-headedness. Negative for tremors, seizures, syncope, facial asymmetry, speech difficulty, weakness, numbness and headaches.  Hematological: Negative for adenopathy. Does not bruise/bleed easily.  Psychiatric/Behavioral: Negative for sleep disturbance and dysphoric mood. The patient is not nervous/anxious.        Objective:    BP 120/62 mmHg  Pulse 61  Resp 14  Ht 5\' 1"  (1.549 m)  Wt 236 lb (107.049 kg)  BMI 44.61 kg/m2  SpO2 96% Physical Exam  Constitutional: She is oriented to person, place, and time. She appears well-developed and well-nourished. No distress.  HENT:  Head: Normocephalic and atraumatic.  Right Ear:  External ear normal.  Left Ear: External ear normal.  Nose: Nose normal.  Mouth/Throat: Oropharynx is clear and moist. No oropharyngeal exudate.  Eyes: Conjunctivae are normal. Pupils are equal, round, and reactive to light. Right eye exhibits no discharge. Left eye exhibits no discharge. No scleral icterus.  Neck: Normal range of motion. Neck supple. No tracheal deviation present. No thyromegaly present.  Cardiovascular: Normal rate, regular rhythm, normal heart sounds and intact distal pulses.  Exam reveals no gallop and no friction rub.   No murmur heard. Pulmonary/Chest: Effort normal and breath sounds normal. No respiratory distress. She has no wheezes. She has no rales. She exhibits no tenderness.    Musculoskeletal: Normal range of motion. She exhibits no edema or tenderness.  Lymphadenopathy:    She has no cervical adenopathy.  Neurological: She is alert and oriented to person, place, and time. No cranial nerve deficit. She exhibits normal muscle tone. Coordination normal.  Skin: Skin is warm and dry. No rash noted. She is not diaphoretic. No erythema. No pallor.  Psychiatric: She has a normal mood and affect. Her behavior is normal. Judgment and thought content normal.          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 Will continue prn  Oxycodone for severe pain.      Diabetes mellitus type 2, controlled - Primary    Will check A1c with labs today. Continue Glipizide.       Relevant Orders   Comprehensive metabolic panel   Hemoglobin A1c   Lipid panel  Dizziness and giddiness    Recent episodes of lightheadedness. Likely multifactorial with recent elevated BG, potential episodic hypotension on medication, use of sedating medication. Will set up carotid dopplers. Will check CBC, CMP, TSH, B12 with labs.      Relevant Orders   US Carotid Duplex  Bilateral   CBC w/Diff   B12   TSH   Screening for breast cancer   Relevant Orders   MM Digital Screening       Return in about 2 weeks (around 09/15/2014) for Recheck.

## 2014-09-01 NOTE — Assessment & Plan Note (Signed)
Will check A1c with labs today. Continue Glipizide. 

## 2014-09-01 NOTE — Patient Instructions (Signed)
Labs today.  We will set up carotid dopplers.  Follow up in 2 weeks.

## 2014-09-04 ENCOUNTER — Other Ambulatory Visit: Payer: Self-pay

## 2014-09-04 MED ORDER — "INSULIN SYRINGE 28G X 1/2"" 1 ML MISC": Status: DC

## 2014-09-04 MED ORDER — INSULIN DETEMIR 100 UNIT/ML ~~LOC~~ SOLN: 10.0000 [IU] | Freq: Every day | SUBCUTANEOUS | Status: DC

## 2014-09-06 ENCOUNTER — Ambulatory Visit (INDEPENDENT_AMBULATORY_CARE_PROVIDER_SITE_OTHER): Payer: Commercial Managed Care - HMO | Admitting: *Deleted

## 2014-09-06 DIAGNOSIS — E119 Type 2 diabetes mellitus without complications: Secondary | ICD-10-CM

## 2014-09-06 NOTE — Progress Notes (Signed)
Pt presents with daughter for insulin administration teaching. Daughter will be doing injections. Pt had not checked sugar yet today, CBG 385 in office  Discussed with her administration sites and to rotate daily. Discussed proper administration of [place] injection. Discussed dosing of Levemir. Explained to daughter how to draw up insulin, she demonstrated without difficulty. She cleaned administration site, and demonstrated [place] injection without difficulty. Pt tolerated. Advised pt to check sugar 2-3 times daily and to notify us of any lows, discussed symptoms. Asked to record sugars and bring with her to her next appointment. Questions answered,  verbalized understanding.

## 2014-09-12 ENCOUNTER — Telehealth: Payer: Self-pay | Admitting: Internal Medicine

## 2014-09-12 ENCOUNTER — Emergency Department: Payer: Self-pay | Admitting: Student

## 2014-09-12 DIAGNOSIS — R918 Other nonspecific abnormal finding of lung field: Secondary | ICD-10-CM | POA: Diagnosis not present

## 2014-09-12 DIAGNOSIS — R51 Headache: Secondary | ICD-10-CM | POA: Diagnosis not present

## 2014-09-12 DIAGNOSIS — Z950 Presence of cardiac pacemaker: Secondary | ICD-10-CM | POA: Diagnosis not present

## 2014-09-12 DIAGNOSIS — R0781 Pleurodynia: Secondary | ICD-10-CM | POA: Diagnosis not present

## 2014-09-12 DIAGNOSIS — Z87891 Personal history of nicotine dependence: Secondary | ICD-10-CM | POA: Diagnosis not present

## 2014-09-12 DIAGNOSIS — R0789 Other chest pain: Secondary | ICD-10-CM | POA: Diagnosis not present

## 2014-09-12 DIAGNOSIS — J984 Other disorders of lung: Secondary | ICD-10-CM | POA: Diagnosis not present

## 2014-09-12 DIAGNOSIS — J479 Bronchiectasis, uncomplicated: Secondary | ICD-10-CM | POA: Diagnosis not present

## 2014-09-12 DIAGNOSIS — Z95828 Presence of other vascular implants and grafts: Secondary | ICD-10-CM | POA: Diagnosis not present

## 2014-09-12 DIAGNOSIS — I10 Essential (primary) hypertension: Secondary | ICD-10-CM | POA: Diagnosis not present

## 2014-09-12 DIAGNOSIS — R0602 Shortness of breath: Secondary | ICD-10-CM | POA: Diagnosis not present

## 2014-09-12 DIAGNOSIS — R079 Chest pain, unspecified: Secondary | ICD-10-CM | POA: Diagnosis not present

## 2014-09-12 DIAGNOSIS — Z880 Allergy status to penicillin: Secondary | ICD-10-CM | POA: Diagnosis not present

## 2014-09-12 NOTE — Telephone Encounter (Signed)
Spoke with pts daughter, she states she is getting pt dressed to go to the ER now.

## 2014-09-12 NOTE — Telephone Encounter (Signed)
[facility] Patient Name: Debra Saunders DOB: 1946/05/21 Initial Comment Caller States she is having pain in her upper right side under her breast. its a sharp pain. whatever position she is laying in it moves with her. Nurse Assessment Nurse: Mechele Dawley, RN, Amy Date/Time Eilene Ghazi Time): 09/12/2014 2:30:53 PM Confirm and document reason for call. If symptomatic, describe symptoms. ---CALLER STATES THAT SHE HAS BEEN HAVING A SHARP PAIN UNDER HER RIGHT BREAST. SHE SAW DR. Gilford Rile AND TOLD HER THAT IT MIGHT BE GAS. SHE NO LONGER THINKS ITS GAS. SHE HAS BEEN TAKING SOME GAS EX FOR 3 DAYS IN A ROW. THERE IS NO IMPROVEMENT. SHE HAS TAKEN TYLENOL FOR THE PAIN NOTHING IS HELPING AT ALL. PAIN IS THERE ALL THE TIME, BUT MOVEMENT MAKES THE PAIN WORSE. WHEN SHE TRIES TO STAND OR SIT IT IS REALLY BAD PAIN. SHARP PAIN. SHE HAS NEVER HAD A PAIN LIKE THIS BEFORE. Has the patient traveled out of the country within the last 30 days? ---Not Applicable Does the patient require triage? ---Yes Related visit to physician within the last 2 weeks? ---Yes Does the PT have any chronic conditions? (i.e. diabetes, asthma, etc.) ---Yes List chronic conditions. ---[facility] HTN, DIABETES, STROKE 2014, Guidelines Guideline Title Affirmed Question Affirmed Notes Abdominal Pain - Upper Patient sounds very sick or weak to the triager Final Disposition User Go to ED Now (or PCP triage) Mechele Dawley, [place], Amy

## 2014-09-13 ENCOUNTER — Encounter: Payer: Self-pay | Admitting: Internal Medicine

## 2014-09-13 ENCOUNTER — Telehealth: Payer: Self-pay | Admitting: Pulmonary Disease

## 2014-09-14 NOTE — Telephone Encounter (Signed)
Called and spoke to Topaz at [place] to initiate PA for [employer]. PA went for review and will take up to 72h for a decision. Reference #: [id number].  Will forward to [place] to follow up on.

## 2014-09-19 NOTE — Telephone Encounter (Signed)
Debra Saunders has been approved through 09/13/2016.  Nothing further needed.

## 2014-09-25 ENCOUNTER — Encounter: Payer: Self-pay | Admitting: Internal Medicine

## 2014-09-25 ENCOUNTER — Ambulatory Visit (INDEPENDENT_AMBULATORY_CARE_PROVIDER_SITE_OTHER): Payer: Commercial Managed Care - HMO | Admitting: Internal Medicine

## 2014-09-25 VITALS — BP 144/77 | HR 61 | Temp 98.3°F | Ht 61.0 in | Wt 237.2 lb

## 2014-09-25 DIAGNOSIS — E119 Type 2 diabetes mellitus without complications: Secondary | ICD-10-CM | POA: Diagnosis not present

## 2014-09-25 DIAGNOSIS — R1011 Right upper quadrant pain: Secondary | ICD-10-CM | POA: Insufficient documentation

## 2014-09-25 LAB — COMPREHENSIVE METABOLIC PANEL
ALT: 49 U/L — ABNORMAL HIGH (ref 0–35)
AST: 63 U/L — ABNORMAL HIGH (ref 0–37)
Albumin: 3.7 g/dL (ref 3.5–5.2)
Alkaline Phosphatase: 118 U/L — ABNORMAL HIGH (ref 39–117)
BUN: 15 mg/dL (ref 6–23)
CO2: 31 mEq/L (ref 19–32)
Calcium: 9.4 mg/dL (ref 8.4–10.5)
Chloride: 95 mEq/L — ABNORMAL LOW (ref 96–112)
Creatinine, Ser: 0.74 mg/dL (ref 0.40–1.20)
GFR: 99.43 mL/min (ref >60.00)
Glucose, Bld: 228 mg/dL — ABNORMAL HIGH (ref 70–99)
Potassium: 3.3 mEq/L — ABNORMAL LOW (ref 3.5–5.1)
Sodium: 134 mEq/L — ABNORMAL LOW (ref 135–145)
Total Bilirubin: 0.4 mg/dL (ref 0.2–1.2)
Total Protein: 7.6 g/dL (ref 6.0–8.3)

## 2014-09-25 LAB — LIPASE: Lipase: 54 U/L (ref 11.0–59.0)

## 2014-09-25 MED ORDER — ONDANSETRON HCL 8 MG PO TABS
8.0000 mg | ORAL_TABLET | Freq: Three times a day (TID) | ORAL | Status: DC | PRN
Start: 2014-09-25 — End: 2021-06-29

## 2014-09-25 MED ORDER — LIDOCAINE 5 % EX PTCH: 1.0000 | MEDICATED_PATCH | CUTANEOUS | Status: DC

## 2014-09-25 MED ORDER — INSULIN DETEMIR 100 UNIT/ML ~~LOC~~ SOLN
15.0000 [IU] | Freq: Every day | SUBCUTANEOUS | Status: DC
Start: 2014-09-25 — End: 2015-11-11

## 2014-09-25 NOTE — Assessment & Plan Note (Signed)
BG continue to be elevated despite starting Levemir. Will increase Levemir to 15units daily. Follow up in 1-2 weeks.

## 2014-09-25 NOTE — Progress Notes (Signed)
Pre visit review using our clinic review tool, if applicable. No additional management support is needed unless otherwise documented below in the visit note. 

## 2014-09-25 NOTE — Progress Notes (Signed)
Subjective:    Patient ID: Debra Saunders, female    DOB: 10-04-1945, 69 y.o.   MRN: [id number]  HPI  69YO female presents for follow up.  Right chest wall pain - Went to ED. Had CXR and CT chest which were reportedly normal. Given IV morphine for pain. Pain then recurred. Now severe with movement. Described as tearing pain. Feels nauseous and had some vomiting yesterday. Symptoms feel similar to when she had cholecystitis, however she has had a cholecystectomy.  DM - Started on Levemir, A1c 8.9%. BG continue to be elevated 200-300. No low blood sugars. Lowest 154.  Past medical, surgical, family and social history per today's encounter.  Review of Systems  Constitutional: Negative for fever, chills, appetite change, fatigue and unexpected weight change.  Eyes: Negative for visual disturbance.  Respiratory: Positive for shortness of breath (chronic). Negative for cough and chest tightness.   Cardiovascular: Positive for chest pain. Negative for leg swelling.  Gastrointestinal: Positive for nausea, vomiting and abdominal pain (right upper abdominal).  Musculoskeletal: Negative for myalgias and arthralgias.  Skin: Negative for color change and rash.  Hematological: Negative for adenopathy. Does not bruise/bleed easily.  Psychiatric/Behavioral: Negative for sleep disturbance and dysphoric mood. The patient is not nervous/anxious.        Objective:    BP 144/77 mmHg  Pulse 61  Temp(Src) 98.3 F (36.8 C) (Oral)  Ht 5\' 1"  (1.549 m)  Wt 237 lb 4 oz (107.616 kg)  BMI 44.85 kg/m2  SpO2 99% Physical Exam  Constitutional: She is oriented to person, place, and time. She appears well-developed and well-nourished. No distress.  HENT:  Head: Normocephalic and atraumatic.  Right Ear: External ear normal.  Left Ear: External ear normal.  Nose: Nose normal.  Mouth/Throat: Oropharynx is clear and moist. No oropharyngeal exudate.  Eyes: Conjunctivae are normal. Pupils are equal, round,  and reactive to light. Right eye exhibits no discharge. Left eye exhibits no discharge. No scleral icterus.  Neck: Normal range of motion. Neck supple. No tracheal deviation present. No thyromegaly present.  Cardiovascular: Normal rate, regular rhythm, normal heart sounds and intact distal pulses.  Exam reveals no gallop and no friction rub.   No murmur heard. Pulmonary/Chest: Effort normal and breath sounds normal. No respiratory distress. She has no wheezes. She has no rales. She exhibits no tenderness.  Abdominal: Soft. Bowel sounds are normal. She exhibits no distension. There is tenderness (to light palpation of skin RUQ).  Musculoskeletal: Normal range of motion. She exhibits no edema or tenderness.  Lymphadenopathy:    She has no cervical adenopathy.  Neurological: She is alert and oriented to person, place, and time. No cranial nerve deficit. She exhibits normal muscle tone. Coordination normal.  Skin: Skin is warm and dry. No rash noted. She is not diaphoretic. No erythema. No pallor.  Psychiatric: She has a normal mood and affect. Her behavior is normal. Judgment and thought content normal.          Assessment & Plan:   Problem List Items Addressed This Visit      Unprioritized   Abdominal pain, right upper quadrant - Primary    Right upper abdominal/lower chest wall pain. Pain is severe, worsened with movement. Associated with nausea. Will recheck LFTs and check lipase today. Start Ondansetron for nausea. Start Lidoderm patch to help with pain as some symptoms very superficial. Will get [place] RUQ today. If unremarkable consider abdominal CT. Will request report on recent Chest CT. Follow up in  1 week.      Relevant Medications   LIDODERM 5 % EX PTCH   Other Relevant Orders   US Abdomen Limited RUQ   Lipase   Comprehensive metabolic panel   Diabetes mellitus type 2, controlled    BG continue to be elevated despite starting Levemir. Will increase Levemir to 15units daily.  Follow up in 1-2 weeks.      Relevant Medications   insulin detemir (LEVEMIR) 100 UNIT/ML injection       Return in about 1 week (around 10/02/2014) for Recheck.

## 2014-09-25 NOTE — Assessment & Plan Note (Signed)
Right upper abdominal/lower chest wall pain. Pain is severe, worsened with movement. Associated with nausea. Will recheck LFTs and check lipase today. Start Ondansetron for nausea. Start Lidoderm patch to help with pain as some symptoms very superficial. Will get [place] RUQ today. If unremarkable consider abdominal CT. Will request report on recent Chest CT. Follow up in 1 week.

## 2014-09-25 NOTE — Patient Instructions (Addendum)
Increase Levemir to 15units daily.  We will set up an ultrasound of your liver.  Start Lidoderm patch, applied for 12 hours daily to help with pain.

## 2014-09-26 ENCOUNTER — Telehealth: Payer: Self-pay | Admitting: Internal Medicine

## 2014-09-26 ENCOUNTER — Ambulatory Visit
Admit: 2014-09-26 | Disposition: A | Discharge status: Home / Self Care | Payer: Self-pay | Attending: Internal Medicine | Admitting: Internal Medicine

## 2014-09-26 ENCOUNTER — Telehealth: Payer: Self-pay | Admitting: *Deleted

## 2014-09-26 DIAGNOSIS — R1011 Right upper quadrant pain: Secondary | ICD-10-CM | POA: Diagnosis not present

## 2014-09-26 DIAGNOSIS — R932 Abnormal findings on diagnostic imaging of liver and biliary tract: Secondary | ICD-10-CM | POA: Diagnosis not present

## 2014-09-26 DIAGNOSIS — Z9049 Acquired absence of other specified parts of digestive tract: Secondary | ICD-10-CM | POA: Diagnosis not present

## 2014-09-26 NOTE — Telephone Encounter (Signed)
Liver ultrasound was normal except for fatty infiltration. No findings to explain severe pain.

## 2014-09-26 NOTE — Telephone Encounter (Signed)
Fax from [employer], needing PA for Lidoderm patch. Started online, pending response.

## 2014-09-28 NOTE — Telephone Encounter (Signed)
Notified pt. 

## 2014-10-03 ENCOUNTER — Encounter: Payer: Self-pay | Admitting: Internal Medicine

## 2014-10-03 ENCOUNTER — Ambulatory Visit (INDEPENDENT_AMBULATORY_CARE_PROVIDER_SITE_OTHER): Payer: Commercial Managed Care - HMO | Admitting: Internal Medicine

## 2014-10-03 VITALS — BP 144/83 | HR 65 | Temp 98.0°F | Ht 61.0 in | Wt 238.2 lb

## 2014-10-03 DIAGNOSIS — R1314 Dysphagia, pharyngoesophageal phase: Secondary | ICD-10-CM | POA: Diagnosis not present

## 2014-10-03 DIAGNOSIS — M79644 Pain in right finger(s): Secondary | ICD-10-CM

## 2014-10-03 DIAGNOSIS — R1011 Right upper quadrant pain: Secondary | ICD-10-CM | POA: Diagnosis not present

## 2014-10-03 DIAGNOSIS — E119 Type 2 diabetes mellitus without complications: Secondary | ICD-10-CM | POA: Diagnosis not present

## 2014-10-03 DIAGNOSIS — M79646 Pain in unspecified finger(s): Secondary | ICD-10-CM | POA: Insufficient documentation

## 2014-10-03 MED ORDER — PANTOPRAZOLE SODIUM 40 MG PO TBEC: 40.0000 mg | DELAYED_RELEASE_TABLET | Freq: Every day | ORAL | Status: DC

## 2014-10-03 NOTE — Patient Instructions (Signed)
Try using Miralax 17gm daily to help with constipation.  Continue Oxycodone as needed for severe pain. Make sure you limit your Tylenol to 3gm per day.  We will set up GI evaluation for upper endoscopy.

## 2014-10-03 NOTE — Assessment & Plan Note (Signed)
Persistent RUQ pain. Suspect nerve entrapment. Recent RUQ [place] was normal except for fatty liver. Discussed referral to pain management for steroid injection, but will hold off for now at her request. She may also have referred pain from GI issues, as pain sometimes worsened after eating and associated with dysphagia. Will add Pantoprazole. Placed referral for GI evaluation for upper endoscopy. Continue Tylenol and prn Oxycodone for pain.

## 2014-10-03 NOTE — Progress Notes (Signed)
Pre visit review using our clinic review tool, if applicable. No additional management support is needed unless otherwise documented below in the visit note. 

## 2014-10-03 NOTE — Progress Notes (Signed)
Subjective:    Patient ID: Debra Saunders, female    DOB: March 15, 1946, 69 y.o.   MRN: [id number]  HPI  69YO female presents for follow up.  Last seen 4/4 for RUQ pain. [place] RUQ was normal except for fatty liver. Continues to have RUQ pain, unchanged. Insurance would not approve Lidoderm. Pain seems to be worse with eating.   Sometimes, feels like food is getting caught in lower esophagus. Taking occasional Oxycodone for severe right upper abdominal pain with some improvement.  DM - BG have been much lower. Has not recently used Levemir, and tapered to 10units previously. BG near 150-160. Feels symptomatic when BG less than 150. Feels shaky and nauseous during these times.     Past medical, surgical, family and social history per today's encounter.  Review of Systems  Constitutional: Negative for fever, chills, appetite change, fatigue and unexpected weight change.  Eyes: Negative for visual disturbance.  Respiratory: Positive for shortness of breath (chronic).   Cardiovascular: Negative for chest pain, palpitations and leg swelling.  Gastrointestinal: Positive for nausea and abdominal pain. Negative for diarrhea and constipation.  Skin: Negative for color change and rash.  Hematological: Negative for adenopathy. Does not bruise/bleed easily.  Psychiatric/Behavioral: Negative for dysphoric mood. The patient is not nervous/anxious.        Objective:    BP 144/83 mmHg  Pulse 65  Temp(Src) 98 F (36.7 C) (Oral)  Ht 5\' 1"  (1.549 m)  Wt 238 lb 4 oz (108.069 kg)  BMI 45.04 kg/m2  SpO2 97% Physical Exam  Constitutional: She is oriented to person, place, and time. She appears well-developed and well-nourished. No distress.  HENT:  Head: Normocephalic and atraumatic.  Right Ear: External ear normal.  Left Ear: External ear normal.  Nose: Nose normal.  Mouth/Throat: Oropharynx is clear and moist. No oropharyngeal exudate.  Eyes: Conjunctivae are normal. Pupils are equal,  round, and reactive to light. Right eye exhibits no discharge. Left eye exhibits no discharge. No scleral icterus.  Neck: Normal range of motion. Neck supple. No tracheal deviation present. No thyromegaly present.  Cardiovascular: Normal rate, regular rhythm, normal heart sounds and intact distal pulses.  Exam reveals no gallop and no friction rub.   No murmur heard. Pulmonary/Chest: Effort normal and breath sounds normal. No accessory muscle usage. No tachypnea. No respiratory distress. She has no decreased breath sounds. She has no wheezes. She has no rhonchi. She has no rales. She exhibits no tenderness.  O2 by  2.5L  Abdominal: Soft. Bowel sounds are normal. She exhibits no distension and no mass. There is tenderness (mild TTP RUQ and lower right chest wall). There is no guarding.  Musculoskeletal: Normal range of motion. She exhibits no edema or tenderness.  Lymphadenopathy:    She has no cervical adenopathy.  Neurological: She is alert and oriented to person, place, and time. No cranial nerve deficit. She exhibits normal muscle tone. Coordination normal.  Skin: Skin is warm and dry. No rash noted. She is not diaphoretic. No erythema. No pallor.  Psychiatric: She has a normal mood and affect. Her behavior is normal. Judgment and thought content normal.          Assessment & Plan:   Problem List Items Addressed This Visit      Unprioritized   Abdominal pain, right upper quadrant    Persistent RUQ pain. Suspect nerve entrapment. Recent RUQ [place] was normal except for fatty liver. Discussed referral to pain management for steroid injection, but will  hold off for now at her request. She may also have referred pain from GI issues, as pain sometimes worsened after eating and associated with dysphagia. Will add Pantoprazole. Placed referral for GI evaluation for upper endoscopy. Continue Tylenol and prn Oxycodone for pain.      Diabetes mellitus type 2, controlled - Primary    BG have been  improved, however feeling poorly with BG near 150. Encouraged her to limit snacks high in sugar, try to eat snacks with some protein, such as peanut butter or cheese. Follow up in 4 weeks. Encouraged her to bring record of blood sugars to this visit. Continue Levemir 15units daily.      Dysphagia, pharyngoesophageal phase    Will set up GI evaluation for possible upper endoscopy given intermittent dysphagia. Suspect esophageal narrowing.      Relevant Orders   Ambulatory referral to Gastroenterology   Thumb pain    Right thumb pain with flexion. Will set up evaluation with hand surgeon.      Relevant Orders   Ambulatory referral to Hand Surgery       Return in about 4 weeks (around 10/31/2014) for Recheck.

## 2014-10-03 NOTE — Assessment & Plan Note (Signed)
BG have been improved, however feeling poorly with BG near 150. Encouraged her to limit snacks high in sugar, try to eat snacks with some protein, such as peanut butter or cheese. Follow up in 4 weeks. Encouraged her to bring record of blood sugars to this visit. Continue Levemir 15units daily.

## 2014-10-03 NOTE — Assessment & Plan Note (Signed)
Will set up GI evaluation for possible upper endoscopy given intermittent dysphagia. Suspect esophageal narrowing.

## 2014-10-03 NOTE — Assessment & Plan Note (Signed)
Right thumb pain with flexion. Will set up evaluation with hand surgeon.

## 2014-10-09 ENCOUNTER — Encounter: Payer: Self-pay | Admitting: Cardiovascular Disease

## 2014-10-09 ENCOUNTER — Ambulatory Visit (INDEPENDENT_AMBULATORY_CARE_PROVIDER_SITE_OTHER): Payer: Commercial Managed Care - HMO | Admitting: Cardiovascular Disease

## 2014-10-09 VITALS — BP 135/69 | HR 71 | Ht 61.0 in | Wt 237.2 lb

## 2014-10-09 DIAGNOSIS — I10 Essential (primary) hypertension: Secondary | ICD-10-CM

## 2014-10-09 DIAGNOSIS — I5032 Chronic diastolic (congestive) heart failure: Secondary | ICD-10-CM

## 2014-10-09 NOTE — Patient Instructions (Signed)
Continue same medications.   Your physician wants you to follow-up in: 6 months.  You will receive a reminder letter in the mail two months in advance. If you don't receive a letter, please call our office to schedule the follow-up appointment.  

## 2014-10-09 NOTE — Assessment & Plan Note (Signed)
Fluid overload improved after switching to torsemide 20 mg twice daily. Recent labs showed normal kidney function. Continue current treatment.

## 2014-10-09 NOTE — Progress Notes (Signed)
Primary care physician: Dr. Ronette Deter  HPI  This is a 69 -year-old female who is here today for a followup visit.  She has known history of chronic diastolic heart failure with pulmonary hypertension, bradycardia and chronic atypical chest pain thought to be due to costochondritis. She has chronic left lower extremity swelling which has been evaluated in the past with venous duplex ultrasound on multiple occasions and was negative for DVT. She did have a nonocclusive DVT on ultrasound last year with subsequent IVC filter placement. She likely has lymphedema on the left side as well. Her echocardiogram in 2013 showed normal LV systolic function with mild diastolic dysfunction and evidence of elevated filling pressures. There was also moderate pulmonary hypertension with peak systolic pressure of 56 mm Hg. she underwent cardiac catheterization in August of 2013 which showed no evidence of obstructive coronary artery disease. She was noted to be severely hypertensive with significant elevation in left ventricular end-diastolic pressure.  She was hospitalized in July, 2014 at [facility] for severe chest pain. Cardiac workup was overall negative. CT of the chest showed possible peripheral pulmonary emboli which really did not explain her symptoms. She was anticoagulated which was complicated by subarachnoid hemorrhage and cardiopulmonary arrest. She was transferred to [facility] and was treated conservatively. Anticoagulation was discontinued.[name] She was discharged home but readmitted in August at [facility] with septic shock and acute renal failure requiring brief dialysis. She ultimately improved. Renal function went back to normal.  She continued to have symptomatic bradycardia and underwent a dual-chamber pacemaker placement by Dr. Caryl Comes in [phone]. She had significant improvement in fatigue, dyspnea and dizziness after that. Her diuretics were switched to torsemide for recent fluid overload. She reports improvement  in leg edema and dyspnea.   Allergies  Allergen Reactions  . Other     Blood Thinners-per doctor at [facility]  . Penicillins Hives and Swelling     Current Outpatient Prescriptions on File Prior to Visit  Medication Sig Dispense Refill  . acetaminophen (TYLENOL) 325 MG tablet Take 325 mg by mouth every 6 (six) hours as needed for pain.    [name] ambrisentan (LETAIRIS) 5 MG tablet Take 1 tablet (5 mg total) by mouth daily. 30 tablet 5  . antiseptic oral rinse (BIOTENE) LIQD 15 mLs by Mouth Rinse route as needed for dry mouth.    [name] glipiZIDE (GLUCOTROL) 5 MG tablet Take 0.5 tablets (2.5 mg total) by mouth daily before breakfast. 15 tablet 5  . insulin detemir (LEVEMIR) 100 UNIT/ML injection Inject 0.15 mLs (15 Units total) into the skin at bedtime. 10 mL 5  . Insulin Syringe-Needle U-100 (INSULIN SYRINGE 1CC/28G) 28G X 1/2" 1 ML MISC Use as directed with levemir insulin. Dx: E11.9 100 each 5  . meclizine (ANTIVERT) 50 MG tablet Take 1 tablet (50 mg total) by mouth 2 (two) times daily as needed for dizziness. 30 tablet 0  . Menthol, Topical Analgesic, (BIOFREEZE EX) Apply 1 each topically as needed (for pain).    . NON FORMULARY Oxygen 3 liters daily.    . NON FORMULARY CPAP    . ondansetron (ZOFRAN) 8 MG tablet Take 1 tablet (8 mg total) by mouth every 8 (eight) hours as needed for nausea or vomiting. 30 tablet 2  . oxyCODONE-acetaminophen (PERCOCET/ROXICET) 5-325 MG per tablet Take 0.5 tablets by mouth every 8 (eight) hours as needed for severe pain.    . pantoprazole (PROTONIX) 40 MG tablet Take 1 tablet (40 mg total) by mouth daily. [place]  tablet 3  . polyethylene glycol (MIRALAX / GLYCOLAX) packet Take 17 g by mouth daily as needed for moderate constipation.    . potassium chloride SA (KLOR-CON M20) 20 MEQ tablet Take 1 tablet (20 mEq total) by mouth 2 (two) times daily. 60 tablet 6  . promethazine (PHENERGAN) 25 MG tablet Take 25 mg by mouth every 4 (four) hours as needed for nausea.    .  Simethicone (GAS-X PO) Take 1 tablet by mouth as needed (for gas).     . torsemide (DEMADEX) 20 MG tablet Take 1 tablet (20 mg total) by mouth 2 (two) times daily. 60 tablet 6  . verapamil (CALAN-SR) 240 MG CR tablet Take 1 tablet (240 mg total) by mouth daily. 30 tablet 5   No current facility-administered medications on file prior to visit.     Past Medical History  Diagnosis Date  . Thyroid disorder   . Diabetes mellitus     type II  . Thyroid nodule   . Sleep apnea   . Obesity   . Hypertension   . Hyperlipidemia   . Urinary incontinence   . CHF (congestive heart failure) 2005    Dx at [facility]  . Chronic back pain   . COPD (chronic obstructive pulmonary disease)     2L [place]  . Sleep apnea   . Lipoma of back   . Arthritis   . Streptococcal bacteremia   . Acute renal failure   . Stroke   . Sepsis with metabolic encephalopathy   . Gastritis   . Symptomatic bradycardia      Past Surgical History  Procedure Laterality Date  . Cholecystectomy    . Vaginal hysterectomy    . Cardiac catheterization  2013    @ [place]: No obstructive CAD: Only 20% ostial left circumflex.  . Abdominal hysterectomy  1969  . Cyst removal trunk  2002    BACK  . Lipoma removal  2014    back  . Pacemaker insertion  2015    [place] dual chamber pacemaker implanted by Dr Caryl Comes for symptomatic bradycardia  . Insert / replace / remove pacemaker    . Permanent pacemaker insertion N/A 01/09/2014    Procedure: PERMANENT PACEMAKER INSERTION;  Surgeon: Deboraha Sprang, MD;  Location: [facility] CATH LAB;  Service: Cardiovascular;  Laterality: N/A;     Family History  Problem Relation Age of Onset  . Heart disease Mother   . Hypertension Mother   . COPD Mother     was a smoker  . Thyroid disease Mother   . Hypertension Father   . Hypertension Sister   . Thyroid disease Sister   . Hypertension Sister   . Thyroid disease Sister   . Breast cancer Maternal Aunt      History   Social History  .  Marital Status: Divorced    Spouse Name: N/A  . Number of Children: N/A  . Years of Education: N/A   Occupational History  . Retired- factory work     exposed to [place]  . Smoking status: Former Smoker -- 0.30 packs/day for 2 years    Types: Cigarettes    Quit date: 06/23/1990  . Smokeless tobacco: Never Used  . Alcohol Use: No  . Drug Use: No  . Sexual Activity: Not on file   Other Topics Concern  . Not on file   Social History Narrative   Lives in [place] with daughter. Has  4 children. No pets.      Work - Restaurant manager, fast food, retired      Diet - regular       PHYSICAL EXAM   BP 135/69 mmHg  Pulse 71  Ht 5\' 1"  (1.549 m)  Wt 237 lb 4 oz (107.616 kg)  BMI 44.85 kg/m2 Constitutional: She is oriented to person, place, and time. She appears well-developed and well-nourished. No distress.  HENT: No nasal discharge.  Head: Normocephalic and atraumatic.  Eyes: Pupils are equal and round. Right eye exhibits no discharge. Left eye exhibits no discharge.  Neck: Normal range of motion. Neck supple. No JVD present. No thyromegaly present.  Cardiovascular: Normal rate, regular rhythm, normal heart sounds. Exam reveals no gallop and no friction rub. No murmur heard.  Pulmonary/Chest: Effort normal and breath sounds normal. No stridor. No respiratory distress. She has no wheezes. She has no rales. She exhibits no tenderness.  Abdominal: Soft. Bowel sounds are normal. She exhibits no distension. There is no tenderness. There is no rebound and no guarding.  Musculoskeletal: Normal range of motion. She exhibits chronic left lower extremity edema and no tenderness.  Neurological: She is alert and oriented to person, place, and time. Coordination normal.  Skin: Skin is warm and dry. No rash noted. She is not diaphoretic. No erythema. No pallor.  Psychiatric: She has a normal mood and affect. Her behavior is normal. Judgment and thought content normal.       ASSESSMENT AND PLAN

## 2014-10-09 NOTE — Assessment & Plan Note (Signed)
Blood pressure is reasonably controlled on current medications. 

## 2014-10-11 ENCOUNTER — Encounter: Payer: Self-pay | Admitting: Pulmonary Disease

## 2014-10-11 ENCOUNTER — Ambulatory Visit (INDEPENDENT_AMBULATORY_CARE_PROVIDER_SITE_OTHER): Payer: Commercial Managed Care - HMO | Admitting: Pulmonary Disease

## 2014-10-11 VITALS — BP 144/58 | HR 89 | Wt 236.0 lb

## 2014-10-11 DIAGNOSIS — J9611 Chronic respiratory failure with hypoxia: Secondary | ICD-10-CM

## 2014-10-11 DIAGNOSIS — Z79899 Other long term (current) drug therapy: Secondary | ICD-10-CM | POA: Diagnosis not present

## 2014-10-11 DIAGNOSIS — G4733 Obstructive sleep apnea (adult) (pediatric): Secondary | ICD-10-CM

## 2014-10-11 DIAGNOSIS — J8410 Pulmonary fibrosis, unspecified: Secondary | ICD-10-CM | POA: Diagnosis not present

## 2014-10-11 DIAGNOSIS — I270 Primary pulmonary hypertension: Secondary | ICD-10-CM

## 2014-10-11 DIAGNOSIS — I272 Pulmonary hypertension, unspecified: Secondary | ICD-10-CM

## 2014-10-11 NOTE — Patient Instructions (Signed)
Continue taking the Letairis as written Continue using oxygen as you're doing We will continue liver function tests on a monthly basis We are going to order a nuclear scan to ensure that the blood clot has completely left her lungs. We will see you back in 3 months, before then we would like for you to have a walking test to check her oxygen level

## 2014-10-11 NOTE — Assessment & Plan Note (Signed)
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 This has been a stable interval.  If she has worsening dyspnea then we will order full pulmonary function testing to evaluate

## 2014-10-11 NOTE — Progress Notes (Signed)
Subjective:    Patient ID: Debra Saunders, female    DOB: Mar 11, 1946, 69 y.o.   MRN: [id number]  Synopsis: Debra Saunders is a very pleasant female with obstructive sleep apnea, obesity, diastolic dysfunction, and pulmonary hypertension who is referred to the [facility] pulmonary clinic in November 2013 for evaluation of pulmonary hypertension. In 2005 she established care with Dr. Thom Chimes at [facility] for evaluation of the same. Dr. Gaynell Face felt that her pulmonary hypertension was do to her congestive heart failure, obesity, and obstructive sleep apnea. Unfortunately she lost her insurance around 2008 and did not have regular pulmonary visits. During the interval from 2008 2013 her shortness of breath increased and she gained about 50 pounds. She had a left heart catheterization in 2013 which showed very high filling pressures of the left ventricle as well as high systemic pressures but normal coronary arteries. In November 2013 her simple spirometry was completely normal. In 2014 she had a subarachnoid bleed and cardiac arrest and amazingly survived.  She had an IVC filter placed for PE.  HPI Chief Complaint  Patient presents with  . Follow-up    SOB w/activity; cough; chest pain w/cough; sleeps 3-4 hrs nightly but CPAP cuts off and on   Debra Saunders says she feels like she is breathing better since the last visit.  She wonders of this is related to the [place].  She said that she felt better after about a week after starting the medication. She had an elevated liver functino test that was unchanged on repeat tests this month  She feels that she is much more functional in that she is able to take a shower now without difficulty and she feels she can take a deep breath. She has not noticed any side effects from the medication. Next line she continues to use her CPAP machine every night but she says that it is been cutting off lately next line She continues to use and  benefit from her oxygen at 3 L continuously.  Past Medical History  Diagnosis Date  . Thyroid disorder   . Diabetes mellitus     type II  . Thyroid nodule   . Sleep apnea   . Obesity   . Hypertension   . Hyperlipidemia   . Urinary incontinence   . CHF (congestive heart failure) 2005    Dx at [facility]  . Chronic back pain   . COPD (chronic obstructive pulmonary disease)     2L [place]  . Sleep apnea   . Lipoma of back   . Arthritis   . Streptococcal bacteremia   . Acute renal failure   . Stroke   . Sepsis with metabolic encephalopathy   . Gastritis   . Symptomatic bradycardia      Review of Systems  Constitutional: Negative for fever, chills, activity change and fatigue.  HENT: Negative for congestion, postnasal drip and rhinorrhea.   Respiratory: Positive for shortness of breath. Negative for cough, chest tightness and wheezing.   Cardiovascular: Positive for leg swelling. Negative for chest pain and palpitations.       Objective:   Physical Exam Filed Vitals:   10/11/14 1541  BP: 144/58  Pulse: 89  Weight: 236 lb (107.049 kg)  SpO2: 96%  3L [place]  Gen: Obese, chronically ill-appearing l appearing HENT: OP clear, TM's clear, neck supple PULM: Crackles in bases bilaterally, normal percussion CV: RRR, no mgr, left leg edema worse than right edema GI: BS+, soft, nontender Derm:  no cyanosis or rash Psyche: normal mood and affect   2004 [facility]: right heart catheterization from 10/25/02 revealed a PA pressure of 62/25 (mean 36) and a mean PA pressure decreased remarkably to 20 during this catheterization. A PCWP was normal at 6 and a cardiac index of 2.42 L/m/m2 with an initial increase in vasodilator therapy to 2.71 L/m/m2 2004 6MW 313 meters (1,027 feet) On oxygen in 2004 04/22/2012 6 minute walk>> total distance walked 950 feet, heart rate ranged between 56 and 88, O2 saturation ranged between 91 and 98% on 2 L nasal cannula 11-26 Spirometry >> no obstruction, FEV1 1.42 L  (70%) 11-26 6MW >> 795 Ft, O2 saturation 90% lowest on 2L 07/2012 6MW >> 1115 feet, O2 saturation 97% 2LNC 03/2014 CT chest > Upper lobe and perihilar predominant ground glass and reticular coarsening and traction bronchiectasis and architectural distortion suggesting postinflammatory fibrosis.  Sarcoid considered considering hilar lymph nodes, enlarged pulmonary arteries indicative of pulmonary arterial hypertension  [facility] labs and PCP office visits reviewed today, elevated LFT noted     Assessment & Plan:   Pulmonary hypertension I believe that Debra Saunders has pulmonary hypertension related to sarcoidosis. See multiple previous notes referring to this. She has had clinical benefit after starting with [facility] and that she is breathing much better. We need to have some objective evidence to follow so I'm going to schedule 6 minute walk testing. The Debra Saunders has been associated with a slight increase in her liver function tests but this did not change over one month's time. Further, the elevation is quite mild. Without symptoms Letairis does not need to be stopped unless the liver function tests (AST and ALT) are greater than 5 the upper limit of normal.  Plan: -Consider VQ scan this year, however she is not a candidate for thrombectomy -Continue monthly liver function tests to monitor for liver injury -Patient was advised at length to stop the medication should she develop bloating, jaundice, unexplained nausea -Continue Ambrisentan at current dose -6 min walk now and in 3 months -Follow-up 3 months   OSA (obstructive sleep apnea) She has been compliant with her CPAP therapy but the machine has not been working effectively recently.  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 This has been a stable  interval.  If she has worsening dyspnea then we will order full pulmonary function testing to evaluate   Chronic respiratory failure with hypoxia Stable interval continue 3 L of oxygen continuously.     Updated Medication List Outpatient Encounter Prescriptions as of 10/11/2014  Medication Sig  . acetaminophen (TYLENOL) 325 MG tablet Take 325 mg by mouth every 6 (six) hours as needed for pain.  [name] ambrisentan (LETAIRIS) 5 MG tablet Take 1 tablet (5 mg total) by mouth daily.  [name] antiseptic oral rinse (BIOTENE) LIQD 15 mLs by Mouth Rinse route as needed for dry mouth.  [name] glipiZIDE (GLUCOTROL) 5 MG tablet Take 0.5 tablets (2.5 mg total) by mouth daily before breakfast.  . insulin detemir (LEVEMIR) 100 UNIT/ML injection Inject 0.15 mLs (15 Units total) into the skin at bedtime.  . Insulin Syringe-Needle U-100 (INSULIN SYRINGE 1CC/28G) 28G X 1/2" 1 ML MISC Use as directed with levemir insulin. Dx: E11.9  . meclizine (ANTIVERT) 50 MG tablet Take 1 tablet (50 mg total) by mouth 2 (two) times  daily as needed for dizziness.  . Menthol, Topical Analgesic, (BIOFREEZE EX) Apply 1 each topically as needed (for pain).  . NON FORMULARY Oxygen 3 liters daily.  . NON FORMULARY CPAP  . ondansetron (ZOFRAN) 8 MG tablet Take 1 tablet (8 mg total) by mouth every 8 (eight) hours as needed for nausea or vomiting.  [name] oxyCODONE-acetaminophen (PERCOCET/ROXICET) 5-325 MG per tablet Take 0.5 tablets by mouth every 8 (eight) hours as needed for severe pain.  . pantoprazole (PROTONIX) 40 MG tablet Take 1 tablet (40 mg total) by mouth daily.  . polyethylene glycol (MIRALAX / GLYCOLAX) packet Take 17 g by mouth daily as needed for moderate constipation.  . potassium chloride SA (KLOR-CON M20) 20 MEQ tablet Take 1 tablet (20 mEq total) by mouth 2 (two) times daily.  . promethazine (PHENERGAN) 25 MG tablet Take 25 mg by mouth every 4 (four) hours as needed for nausea.  . Simethicone (GAS-X PO) Take 1 tablet by mouth as  needed (for gas).   . torsemide (DEMADEX) 20 MG tablet Take 1 tablet (20 mg total) by mouth 2 (two) times daily.  . verapamil (CALAN-SR) 240 MG CR tablet Take 1 tablet (240 mg total) by mouth daily.

## 2014-10-11 NOTE — Assessment & Plan Note (Signed)
Stable interval continue 3 L of oxygen continuously.

## 2014-10-11 NOTE — Assessment & Plan Note (Addendum)
I believe that Frica has pulmonary hypertension related to sarcoidosis. See multiple previous notes referring to this. She has had clinical benefit after starting with [facility] and that she is breathing much better. We need to have some objective evidence to follow so I'm going to schedule 6 minute walk testing. The Milda Smart has been associated with a slight increase in her liver function tests but this did not change over one month's time. Further, the elevation is quite mild. Without symptoms Letairis does not need to be stopped unless the liver function tests (AST and ALT) are greater than 5 the upper limit of normal.  Plan: -Consider VQ scan this year, however she is not a candidate for thrombectomy -Continue monthly liver function tests to monitor for liver injury -Patient was advised at length to stop the medication should she develop bloating, jaundice, unexplained nausea -Continue Ambrisentan at current dose -6 min walk now and in 3 months -Follow-up 3 months

## 2014-10-11 NOTE — Assessment & Plan Note (Signed)
She has been compliant with her CPAP therapy but the machine has not been working effectively recently.  Plan: -Continue CPAP  -we will send out a representative from her company to evaluate the machine

## 2014-10-12 NOTE — Addendum Note (Signed)
Addended by: Bailey Faiella L on: 10/12/2014 05:19 PM   Modules accepted: Orders  

## 2014-10-13 ENCOUNTER — Ambulatory Visit
Admit: 2014-10-13 | Disposition: A | Discharge status: Home / Self Care | Payer: Self-pay | Attending: Pulmonary Disease | Admitting: Pulmonary Disease

## 2014-10-13 DIAGNOSIS — R079 Chest pain, unspecified: Secondary | ICD-10-CM | POA: Diagnosis not present

## 2014-10-13 DIAGNOSIS — J8410 Pulmonary fibrosis, unspecified: Secondary | ICD-10-CM | POA: Diagnosis not present

## 2014-10-13 DIAGNOSIS — R0602 Shortness of breath: Secondary | ICD-10-CM | POA: Diagnosis not present

## 2014-10-13 DIAGNOSIS — J984 Other disorders of lung: Secondary | ICD-10-CM | POA: Diagnosis not present

## 2014-10-13 DIAGNOSIS — I272 Other secondary pulmonary hypertension: Secondary | ICD-10-CM | POA: Diagnosis not present

## 2014-10-13 DIAGNOSIS — Z950 Presence of cardiac pacemaker: Secondary | ICD-10-CM | POA: Diagnosis not present

## 2014-10-13 DIAGNOSIS — M47894 Other spondylosis, thoracic region: Secondary | ICD-10-CM | POA: Diagnosis not present

## 2014-10-13 NOTE — Op Note (Signed)
PATIENT NAME:  Debra Saunders, Debra Saunders MR#:  [id number] DATE OF BIRTH:  1946/05/20  DATE OF PROCEDURE:  02/03/2013  PREOPERATIVE DIAGNOSES:  1. Acute renal failure.  2. Volume overload.  3. History of lower extremity deep venous thromboses.  4. Obesity.  5. Hypertension.   POSTOPERATIVE DIAGNOSES:  1. Acute renal failure.  2. Volume overload.  3. History of lower extremity deep venous thromboses.  4. Obesity.  5. Hypertension.   PROCEDURES:  1. Ultrasound guidance for vascular access, right jugular vein.  2. Placement of right jugular DuoGlide dialysis catheter.   SURGEON: Algernon Huxley, M.D.   ANESTHESIA: Local.   ESTIMATED BLOOD LOSS: 25 mL.   INDICATION FOR PROCEDURE: A 69 year old [place] American female with acute renal failure. We were asked to place a dialysis catheter.   DESCRIPTION OF PROCEDURE: Initially, the patient's groins were sterilely prepped and draped, but both femoral veins were visualized and found to be full of thrombus and these were not usable for dialysis catheter placement. I then abandoned this approach and turned to the jugulars. The right jugular was widely patent but small. It was accessed under ultrasound guidance with a mild amount of difficulty with a Seldinger needle, and a wire was then placed. After skin nick and dilatation, the 20 cm long DuoGlide dialysis catheter was placed over the wire, and the wire was removed. Both lumens withdrew dark red nonpulsatile blood and flushed easily with sterile saline. It was secured with 2 nylon sutures at 20 cm.   ____________________________ Algernon Huxley, MD jsd:gb D: 02/03/2013 17:44:05 ET T: 02/03/2013 23:53:52 ET JOB#: [id number]  cc: Algernon Huxley, MD, <Dictator> Algernon Huxley MD ELECTRONICALLY SIGNED 02/07/2013 16:09

## 2014-10-13 NOTE — Consult Note (Signed)
Pt with chest wall pain, no pain or difficulty swallowing food or drink.  I will sign off.  Electronic Signatures: Manya Silvas (MD)  (Signed on 21-Aug-14 18:11)  Authored  Last Updated: 21-Aug-14 18:11 by Manya Silvas (MD)

## 2014-10-13 NOTE — Discharge Summary (Signed)
PATIENT NAME:  Debra Saunders, Debra Saunders MR#:  [id number] DATE OF BIRTH:  May 01, 1946  DATE OF ADMISSION:  07/30/2012 DATE OF DISCHARGE:  07/31/2012  DISCHARGE DIAGNOSES:   1.  Symptomatic sinus bradycardia/sinus bradyarrhythmia likely vasovagal in nature due to nausea. Coreg has been stopped and her heart rate has been stable, although still fluctuating between 45 to 60, but she is asymptomatic. She had a negative cardiac catheterization in August 2013.  2.  Chronic diastolic heart failure well compensated at this time.   SECONDARY DIAGNOSES:   1.  Diabetes mellitus.  2.  Hypertension.  3.  Diastolic heart failure.  4.  Obstructive sleep apnea.  5.  Chronic obstructive pulmonary disease on 2 liters of oxygen.   CONSULTATION:  Cardiology, Dr. Fletcher Anon.   PROCEDURES/RADIOLOGY:  Chest x-ray on February 7th showed low-grade CHF with mild interstitial edema.   Thyroid ultrasound on February 6th showed multiple bilateral thyroid nodules. Two nodules on the left are solid with the largest node measuring 2 cm in greatest dimension.   MAJOR LABORATORY PANEL:  Urinalysis on admission was negative.   HISTORY AND SHORT HOSPITAL COURSE:  The patient is a 69 year old female with the above-mentioned medical problems who was admitted for symptomatic sinus bradycardia. Her Coreg was stopped. Cardiology consultation was obtained with Dr. Fletcher Anon who recommended holding Coreg and ambulate her for any symptoms. She did not get any symptoms with ambulation and was discharged home in stable condition. On the date of discharge, her vital signs are as follows: Temperature 98.7, heart rate 66 per minute, respirations 18 per minute, blood pressure 120/77 mmHg and she was saturating 96% on room air.   PERTINENT PHYSICAL EXAMINATION ON THE DATE OF DISCHARGE:  CARDIOVASCULAR: S1, S2 normal. No murmurs, rubs or gallop.  LUNGS: Clear to auscultation bilaterally. No wheezing, rales, rhonchi or crepitation.  ABDOMEN: Soft, benign.   NEUROLOGIC: Nonfocal examination.   All other physical examination remained at baseline.   DISCHARGE MEDICATIONS:   1.  Spironolactone 25 mg p.o. daily.  2.  Amlodipine 5 mg p.o. daily.  3.  Aspirin 81 mg p.o. daily.  4.  Vasotec 20 mg p.o. b.i.d.  5.  Metformin 500 mg p.o. b.i.d.  6.  Chlorthalidone 25 mg p.o. daily.  7.  Qvar 1 puff inhaled twice a day.  8.  Ventolin 2 puffs inhaled 4 times a day as needed.  9.  Magnesium oxide 400 mg p.o. b.i.d.  10.  Oxygen 2 liters by nasal cannula continuous.   DISCHARGE DIET:  Low salt, low fat, low cholesterol diet.   DISCHARGE ACTIVITY:  As tolerated.   DISCHARGE INSTRUCTIONS AND FOLLOWUP:  The patient was instructed to follow up with her primary care physician, Dr. Ronette Deter, in 1 to 2 weeks. She will need followup with Dr. Fletcher Anon this coming Friday on February 14th as scheduled. She was instructed to stop her Coreg/carvedilol. She was also instructed to follow up with her primary care physician regarding her thyroid nodules.   TOTAL TIME DISCHARGING THIS PATIENT:  45 minutes.    ____________________________ Lucina Mellow. Manuella Ghazi, MD vss:si D: 08/03/2012 17:03:11 ET T: 08/03/2012 23:32:40 ET JOB#: [id number]  cc: Emaline Karnes S. Manuella Ghazi, MD, <Dictator> Eduard Clos. Gilford Rile, MD Mertie Clause. Fletcher Anon, [place] MD ELECTRONICALLY SIGNED 08/04/2012 16:42

## 2014-10-13 NOTE — Consult Note (Signed)
General Aspect Ms. Doster is a 69yo African American female w/ PMHx s/f chronic diastolic CHF, HTN, OSA (on CPAP), PAH (felt secondary to OSA, obesity, CHF), COPD, chronic respiratory failure (on home O2), sinus bradycardia and DM2 who was hospitalized at [facility] for mental status changes, renal failure, new anemia.Cardiology was consulted for elevated cardiac enz.  Echo 12/2012- normal LV systolic function, mild diastolic dysfunction, elevated filling pressures and moderate-PAH   Cardiac catheterization 01/2012- no evidence of obstructive coronary artery disease. She was noted to be severely hypertensive with significant elevation in left ventricular end-diastolic pressure.    Recent admission for chest pain, started on lovenox/xarelto for small PE, with subarachnoid bleed, sent [facility], [facility] after IVC filter placed.  Poor po intake over the past 1 to 2 weeks, now with mental status changes.  Admitted, received IVF 2 liters, HCT 20, creatinine 7 (new)  She has been on home oxygen due to documented hypoxia. She follows up with Dr. Lake Bells as well, most recently 10/2012. She finished pulmonary rehabilitation with improvement in functional status. CPAP re-calibrated and home O2 continued. No indication for pulmonary vasodilator.   In teyh past, she has had multiple negative work-ups for DVT given underlying chronic LE edema. Unresponsive to diuretics. She wears a ompression boot at home with improvement.   [place] admission, she was experiencing constant sharp centralized chest pain worse on inspiration and to the touch since 7/21.  No exertion component, however the patient is quite sedentary at baseline.   She continues to use chronic O2. Denies associated shortness of breath, nausea, diaphoresis, worsening DOE, PND, orthopnea, palpitations or syncope. She has chronic LLE edema for which she uses an inflatable boot. No leg pain. No recent illnesses or cardiac  procedures.   Present Illness . PAST MEDICAL HISTORY:  1.  Diabetes.  2.  Hypertension.  3.  History of CHF, felt to be likely due to diastolic CHF. 4.  Obstructive sleep apnea, on CPAP. 5.  COPD, on 2 liters of home O2.   PAST SURGICAL HISTORY:  1.  Status post hysterectomy.  2.  Status post tumor removal from her back. 3.  Status post cholecystectomy.   ALLERGIES:  PENICILLIN.  CURRENT MEDICATIONS:  She is on amlodipine 5 daily, aspirin 81, 1 tab p.o. daily, chlorthalidone 25 p.o. daily, enalapril 20, 1 tab p.o. b.i.d., magnesium oxide 400, 1 tab p.o. b.i.d., metformin 500, 1 tab p.o. b.i.d.   SOCIAL HISTORY: Lives at home with one of her daughters. Denies any smoking, has not smoked in more than 23 years. No alcohol use.   FAMILY HISTORY:  Significant for mother with CHF.   Physical Exam:  GEN well developed, well nourished, no acute distress, obese   HEENT hearing intact to voice, moist oral mucosa   NECK supple  No masses   RESP normal resp effort  clear BS  no use of accessory muscles  on nasal canula oxygen   CARD Regular rate and rhythm  Normal, S1, S2  No murmur  acute point tenderness on palpation to sternum   ABD denies tenderness  positive liver/spleen enlargement  soft  normal BS   EXTR negative cyanosis/clubbing, LLE nonpitting edema, calor, nontender   SKIN normal to palpation   NEURO follows commands, motor/sensory function intact   PSYCH alert   Review of Systems:  Subjective/Chief Complaint SOB, mild chest pain, better with morphine   General: Weakness   Skin: No Complaints  ENT: No Complaints   Eyes: No Complaints   Neck: No Complaints   Respiratory: Short of breath   Cardiovascular: Chest pain or discomfort   Gastrointestinal: No Complaints   Genitourinary: No Complaints   Vascular: No Complaints  edema   Musculoskeletal: No Complaints   Neurologic: No Complaints   Hematologic: No Complaints   Endocrine: No Complaints    Psychiatric: No Complaints   Review of Systems: All other systems were reviewed and found to be negative   Medications/Allergies Reviewed Medications/Allergies reviewed   Home Medications: Medication Instructions Status  amlodipine 5 mg oral tablet 1 tab(s) orally once a day for hypertension (9 am) Active  carvedilol 3.125 mg oral tablet 1 tab(s) orally 2 times a day for hypertension (9 am, 5 pm) Active  docusate sodium 100 mg oral capsule 1 cap(s) orally 2 times a day for constipation (9 am, 5 pm) Active  famotidine 20 mg oral tablet 1 tab(s) orally 2 times a day for GERD (9 am, 5 pm) Active  levETIRAcetam 500 mg oral tablet 1 tab(s) orally 2 times a day for seizures (9 am, 5 pm) Active  pantoprazole 40 mg oral delayed release tablet 1 tab(s) orally once a day for GERD (9 am) Active  Xarelto 15 mg oral tablet 1 tab(s) orally 2 times a day for anticoagulation (7 am, 5 pm) Active  acetaminophen 325 mg oral tablet 2 tab(s) (650 mg) orally every 6 hours, As Needed - for Fever Active  albuterol CFC free 90 mcg/inh inhalation aerosol 2 puff(s) inhaled 4 times a day, As Needed - for Shortness of Breath Active  ibuprofen 200 mg oral tablet 2 tab(s) (400 mg) orally every 6 hours, As Needed - for Pain Active  nitroglycerin 0.4 mg sublingual tablet 1 tab(s) sublingual every 5 minutes, As Needed - for Chest Pain Active  Percocet 10/325 325 mg-10 mg oral tablet 1 tab(s) orally every 6 hours, As Needed - for Pain Active  polyethylene glycol 3350 - oral powder for reconstitution 17 gram(s) (1 scoop) orally once a day, As Needed - for Constipation Active  metformin 500 mg oral tablet 1 tab(s) orally 2 times a day for diabetes mellitus (9 am, 5 pm) Active  magnesium oxide 400 mg oral tablet 1 tab(s) orally 2 times a day for supplement (9 am, 5 pm) Active  enalapril 20 mg oral tablet 1 tab(s) orally 2 times a day for hypertension (9 am, 5 pm) Active   Lab Results: Thyroid:  14-Aug-14 04:41   Thyroid  Stimulating Hormone  0.447 (0.45-4.50 (International Unit)  ----------------------- Pregnant patients have  different reference  ranges for TSH:  - - - - - - - - - -  Pregnant, first trimetser:  0.36 - 2.50 uIU/mL)  TDMs:  14-Aug-14 04:41   Vancomycin, Random 23 (Result(s) reported on 03 Feb 2013 at [facility].)  Routine Chem:  14-Aug-14 04:41   Result Comment [place] - RESULTS VERIFIED BY REPEAT TESTING.  - PREVIOUS CALL:02/02/13 AT 2012..TPL  Result(s) reported on 03 Feb 2013 at 05:34AM.  Glucose, Serum  207  BUN  98  Creatinine (comp)  7.48  Sodium, Serum  126  Potassium, Serum  5.2  Chloride, Serum  91  CO2, Serum 23  Calcium (Total), Serum 8.6  Anion Gap 12  Osmolality (calc) 290  eGFR (African American)  6  eGFR (Non-African American)  5 (eGFR values <9m/min/1.73 m2 may be an indication of chronic kidney disease (CKD). Calculated eGFR is useful in patients with  stable renal function. The eGFR calculation will not be reliable in acutely ill patients when serum creatinine is changing rapidly. It is not useful in  patients on dialysis. The eGFR calculation may not be applicable to patients at the low and high extremes of body sizes, pregnant women, and vegetarians.)  Magnesium, Serum  2.9 (1.8-2.4 THERAPEUTIC RANGE: 4-7 mg/dL TOXIC: > 10 mg/dL  -----------------------)  Hemoglobin A1c ([facility]) 6.2 (The American Diabetes Association recommends that a primary goal of therapy should be <7% and that physicians should reevaluate the treatment regimen in patients with HbA1c values consistently >8%.)  Cholesterol, Serum 137  Triglycerides, Serum 197  HDL (INHOUSE)  25  VLDL Cholesterol Calculated 39  LDL Cholesterol Calculated 73 (Result(s) reported on 03 Feb 2013 at [facility].)  Cardiac:  14-Aug-14 04:41   CK, Total 48  CPK-MB, Serum  < 0.5 (Result(s) reported on 03 Feb 2013 at 05:21AM.)  Troponin I  0.44 (0.00-0.05 0.05 ng/mL or less: NEGATIVE  Repeat testing in 3-6  hrs  if clinically indicated. >0.05 ng/mL: POTENTIAL  MYOCARDIAL INJURY. Repeat  testing in 3-6 hrs if  clinically indicated. NOTE: An increase or decrease  of 30% or more on serial  testing suggests a  clinically important change)  Routine Hem:  14-Aug-14 04:41   WBC (CBC)  12.8  RBC (CBC)  2.49  Hemoglobin (CBC)  6.5  Hematocrit (CBC)  19.8  Platelet Count (CBC) 266  MCV 80  MCH 26.3  MCHC 33.0  RDW  15.6  Neutrophil % 82.7  Lymphocyte % 8.1  Monocyte % 7.5  Eosinophil % 1.2  Basophil % 0.5  Neutrophil #  10.6  Lymphocyte # 1.0  Monocyte #  1.0  Eosinophil # 0.2  Basophil # 0.1 (Result(s) reported on 03 Feb 2013 at 04:59AM.)   EKG:  Interpretation EKG shows NSR with rate 65 bpm, LAD, no ST/T changes   Rate 82   Radiology Results: XRay:    13-Aug-14 19:44, Chest Portable Single View  Chest Portable Single View   REASON FOR EXAM:    Chest Pain  COMMENTS:       PROCEDURE: DXR - DXR PORTABLE CHEST SINGLE VIEW  - Feb 02 2013  7:44PM     RESULT: Comparison made to prior study of 01/16/2013. Endotracheal and NG   tubes been removed. Mild atelectasis versus infiltrates lung bases and   right upper lobe. Heart size normal. Pulmonary vascularity normal.   Mediastinum unremarkable. Degenerative changes thoracic spine.    IMPRESSION:  Mild atelectasis and/or infiltrates in the lower lobes and   right upper lobe. Heart size normal. Pulmonary vascularity normal.        Verified By: Osa Craver, M.D., MD  [place]:    14-Aug-14 01:10, US Kidney Bilateral  US Kidney Bilateral   PRELIMINARY REPORT    The following is a PRELIMINARY Radiology report.  A final report will follow pending radiologist verification.      REASON FOR EXAM:    aki  COMMENTS:       PROCEDURE: [place]  - US KIDNEY  - Feb 03 2013  1:10AM     RESULT:     Technique: Grayscale, and color flow Doppler was performed of the right   and left kidneys.    The right kidney measures 11.05 x 4.34 x  5.89 cm and the left 10.51 x   4.89 x 5.50 cm.    There is no evidence of hydronephrosis, solid or cystic masses, nor  calculi. The urinary bladder is decompressed.  IMPRESSION:      1. Unremarkable bilateral renal ultrasound.     2. A preliminary faxed report was relayed to the patient's floor.      Thank you for the opportunity to contribute to the care of your patient.         Dictated By: Mikki Santee, M.D., MD    Penicillin: Hives, Swelling  Vital Signs/Nurse's Notes: **Vital Signs.:   14-Aug-14 08:00  Vital Signs Type Routine  Temperature Temperature (F) 97.8  Celsius 36.5  Temperature Source oral  Pulse Pulse 62  Respirations Respirations 17  Systolic BP Systolic BP 107  Diastolic BP (mmHg) Diastolic BP (mmHg) 45  Mean BP 65  Pulse Ox % Pulse Ox % 96  Pulse Ox Activity Level  At rest  Oxygen Delivery 3L; Nasal Cannula  Pulse Ox Heart Rate 70    Impression 69yo African American female w/ PMHx s/f chronic diastolic CHF, HTN, OSA (on CPAP), PAH (felt secondary to OSA, obesity, CHF), chronic respiratory failure (on home O2), sinus bradycardia who was hospitalized at [facility] last night for chest pain.  1. mental status changes Multifactorial: dehydration/ATN, anemia, possible infection Improving with medical management    2. Elevated cardiac enz: demand ischemi in setting of hypotrension on arrival, anemia, dehydration/ATN No intervention needed previous, recent cardiac cath no significant CAD Avoid blood thinners given recent subarachoid bleed  3. LLE edema Chronic, LLE>RLE. She has had mutliple LE dopplers in the past which were negative for DVT. She wears inflatable boots with improvement as an outpatient. Not improved by diuretics. Suspect lymphedema.  -- Continue compression boot/scds  4. Chronic diastolic CHF If she devlopes increasing SOB, could start lasix gently Avoid for now given renal failure  5. HTN Low on arrival, on dopamine   Plan 6.  PAH Stable. Mod-severe by 01/2012 cath. Secondary to OSA, COPD, obesity and diastolic dysfunction. No indication for pulmonary vasodilators at this time.   Electronic Signatures: Ida Rogue (MD)  (Signed 14-Aug-14 09:47)  Authored: General Aspect/Present Illness, History and Physical Exam, Review of System, Home Medications, Labs, EKG , Radiology, Allergies, Vital Signs/Nurse's Notes, Impression/Plan   Last Updated: 14-Aug-14 09:47 by Ida Rogue (MD)

## 2014-10-13 NOTE — Consult Note (Signed)
Pt CC chest pain.  She has chest pain in sternal area but not when she swallows most of the time.  Due to her multiple complications I will recommend empiric treatment and hold on any further invasive studies at this time.  Electronic Signatures: Manya Silvas (MD)  (Signed on 20-Aug-14 18:29)  Authored  Last Updated: 20-Aug-14 18:29 by Manya Silvas (MD)

## 2014-10-13 NOTE — H&P (Signed)
PATIENT NAME:  Debra Saunders, Debra Saunders MR#:  [id number] DATE OF BIRTH:  1945-11-01  DATE OF ADMISSION:  07/30/2012  ADMITTING PHYSICIAN: Gladstone Lighter, M.D.   PRIMARY CARE PHYSICIAN: Dr. Ronette Deter.   PRIMARY CARDIOLOGIST: Dr. Fletcher Anon.   CHIEF COMPLAINT: Dizziness.   HISTORY OF PRESENT ILLNESS: The patient is a 69 year old pleasant African American female with past medical history significant for hypertension, nonischemic cardiomyopathy and congestive heart failure, obstructive sleep apnea on CPAP, COPD on 2 liters oxygen, and diabetes mellitus, was sent in from Cardiop[facility] Rehab today secondary to brady, symptomatic bradycardia. The patient states when she woke up this morning she felt extremely fatigued and also dizzy, not quite herself. She wanted to call and take the day off but she pushed herself to go to the rehab today. During exercise this morning, when they monitoring her, her heart rate dropped down to as low as 30s with sinus brady and sinus brady arrhythmia. No heart block noted on this strip was sent over here. She continued to feel dizzy so she is sent over to the ER. In the ER, her heart rate is now fluctuating anywhere between 40 to 60 range. She is slightly better, but not completely normal. So she is being admitted under observation for her symptomatic bradycardia.   PAST MEDICAL HISTORY:  1. Diabetes mellitus.  2. Hypertension.  3. Congestive heart failure, likely diastolic dysfunction because her last cardiac catheterization in 01/2012 showing EF of 55% and no obstructive coronary artery disease noted.  4. Obstructive sleep apnea on CPAP.  5. COPD on 2 liters home oxygen.  PAST SURGERIES: 1. Hysterectomy.  2. Tumor removed from her back.   3. Cholecystectomy.  4. Cardiac catheterization.   ALLERGIES TO MEDICATIONS: PENICILLIN.   HOME MEDICATIONS:  1. Amlodipine 5 mg p.o. daily.  2. Aspirin 81 mg p.o. daily.  3. Coreg 6.25 mg p.o. b.i.d.  4. Chlorthalidone 25  mg p.o. daily.  5. Magnesium oxide 400 mg p.o. b.i.d.  6. Metformin 500 mg p.o. b.i.d.  7. Qvar 80 mcg inhalation 1 puff b.i.d.  8. Spironolactone 25 mg p.o. daily.  9. Vasotec 20 mg p.o. b.i.d.  10. Ventolin inhaler 2 puffs 4 times a day as needed for shortness of breath.    SOCIAL HISTORY: Lives at home with one of her daughters. She denies any smoking currently, use to smoke for more than 22 years ago. No alcohol use.   FAMILY HISTORY: Significant for her mother with congestive heart failure.   REVIEW OF SYSTEMS: CONSTITUTIONAL: No fever. Positive for fatigue and weakness.  EYES: No blurred vision, double vision, inflammation, or glaucoma.  ENT: No tinnitus, ear pain, hearing loss, epistaxis or discharge.  RESPIRATORY: No cough, wheeze, hemoptysis, or chronic obstructive pulmonary disease.  CARDIOVASCULAR: No chest pain stopping edema. Positive for arrhythmia. No palpitations or syncope.  GASTROINTESTINAL: No nausea, vomiting, diarrhea, abdominal pain, hematemesis, or melena.  GENITOURINARY: No dysuria, hematuria, renal calculus, frequency, or incontinence.  ENDOCRINE: No polyuria, nocturia, thyroid problems, heat or cold intolerance.  HEMATOLOGY: No anemia, easy bruising or bleeding.  SKIN: No acne, rash, or lesions.  MUSCULOSKELETAL: No neck, back, shoulder pain, arthritis, or gout.  NEUROLOGIC: No numbness, weakness, positive for dizziness. No CVA, TIA or seizure. PSYCHOLOGICAL: No anxiety, insomnia, or depression.    PHYSICAL EXAMINATION:  VITAL SIGNS: Temperature 98.1 degrees Fahrenheit, pulse 61 and respirations 18, blood pressure 187/79, pulse ox 97% on room air.  GENERAL: A well built, well nourished female lying in bed, not  in any acute distress.  HEENT: Normocephalic, atraumatic. Pupils equal, round, reacting to light. Anicteric sclerae. Extraocular movements intact. Oropharynx clear without erythema, mass or exudates.  NECK: Supple. No lymphadenopathy, JVD, or carotid  bruits, multinodular goiter is present.  LUNGS: Moving air bilaterally and posterior bibasilar crackles noted and no wheeze or rhonchi, no use of accessory muscles for breathing.  CARDIOVASCULAR: S1, S2, regular rate and rhythm. No murmurs, rubs, or gallops.  ABDOMEN: Soft, nontender, nondistended. No hepatosplenomegaly. Normal bowel sounds.  EXTREMITIES: Her left lower extremity is enlarged than the right one, which according to the patient is chronic, and Doppler lower extremities done as an outpatient were negative for any DVT. She does have feeble dorsalis pedis pulses palpable in both extremities. No clubbing or cyanosis.  SKIN: No acne, rash, or lesions.  LYMPHATICS: No cervical lymphadenopathy.  NEUROLOGIC: Cranial nerves are intact. No focal motor or sensory deficits.  PSYCHOLOGICAL: The patient is awake, alert, oriented x 3.   LABORATORY, DIAGNOSTIC AND RADIOLOGICAL DATA: Urinalysis negative for any infection. Third set of troponin is negative. CBC is pending as the blood hemolyzed. BMP shows sodium 135, potassium 3.9, chloride 100, bicarb 27, BUN 22, creatinine 0.78 glucose 138, and calcium 8.9.  ALT 29, AST 31, alk phos is 119, total bili is 0.3, albumin of 3.4. Chest x-ray showing mild CHF, mild interstitial edema especially on the right side, pneumonitis or CHF. EKG showing sinus bradycardia, sinus arrhythmia, a heart rate of 58.   ASSESSMENT AND PLAN: This is a 69 year old pleasant female with a history of diabetes, hypertension, congestive heart failure, brought in for symptomatic bradycardia and also noted to be in mild congestive heart failure exacerbation.  1. Symptomatic sinus brady/brady arrhythmia: Possibly benign. Not sure if congestive heart failure exacerbation has anything to do with this. We will get an echo and Cardiology consult by Dr. Fletcher Anon. Suspend her Coreg at this time and admit to telemetry under observation. Heart rate is still fluctuating between 45 to 60, but  symptoms are improving at this time.  2. Congestive heart failure, likely acute on chronic diastolic exacerbation. Based on the catheter in 01/2012, it seems like she does have nonischemic cardiomyopathy. We will start low dose Lasix at 20 mg p.o. daily and also advised the patient to be compliant with low sodium diet. Continue other medications except Coreg due to bradycardia at this time. 3. Diabetes mellitus: Followup HB A1c. She is on sliding scale insulin and metformin while in the hospital.  4. Hypertension. Continue home medication.  5. Obstructive sleep apnea. Continue CPAP and also 2 liters home oxygen and inhalers for her chronic obstructive pulmonary disease. 6. GI and deep vein thrombosis prophylaxis with Prilosec and Lovenox.   CODE STATUS: FULL CODE.   TIME SPENT ON ADMISSION: 50 minutes.     ____________________________ Gladstone Lighter, MD [id number] D: 07/30/2012 14:55:49 ET T: 07/30/2012 15:29:40 ET JOB#: [id number]  cc: Gladstone Lighter, MD, <Dictator> Muhammad A. Fletcher Anon, MD Eduard Clos Gilford Rile, MD Gladstone Lighter MD ELECTRONICALLY SIGNED 08/10/2012 22:22

## 2014-10-13 NOTE — Consult Note (Signed)
Brief Consult Note: Consult note dictated.   Discussed with Attending MD.   Comments: 1. sudden onset of sternal , anterior chest wall pain, no response to steriods, analgesics, nsaiads,  r/o sternal/rib fracture, chondral inflammation  2. chronic pulmonary hypertension, interstitial lung disease, bullous emphysema followed by Dr Curt Jews 3. L leg chronic venous stasis 4. elevated esr,crp, may be multifactorial    Rec CT sternum vs bone scan,   consider V/Q vs chest CT/PE protocal.  Electronic Signatures: Meda Klinefelter (MD)  (Signed 25-Jul-14 13:54)  Authored: Brief Consult Note   Last Updated: 25-Jul-14 13:54 by Leeanne Mannan., Eugene Gavia (MD)

## 2014-10-13 NOTE — Consult Note (Signed)
PATIENT NAME:  Debra Saunders, Debra Saunders MR#:  [id number] DATE OF BIRTH:  Jul 16, 1945  DATE OF CONSULTATION:  02/09/2013  REFERRING PHYSICIAN:  Dr. Darvin Neighbours.  CONSULTING PROVIDERS:  Chesney Suares A. Jerelene Redden, ANP (Adult Nurse Practitioner) and Gaylyn Cheers, MD.   REASON FOR CONSULTATION: Noncardiac chest pain, which responds to GI cocktail.   HISTORY OF PRESENT ILLNESS: This patient was initially hospitalized last month with severe substernal  chest pain and shortness of breath. A chest CT revealed pulmonary embolus and she was placed on Xarelto. She developed unconsciousness and received chest compressions. She was found to have lethargy with slurred speech and was diagnosed with subarachnoid hemorrhage. The patient was transferred to [facility]. She reports she stayed at [facility] for about a week and they told her she had had mini strokes. She did recover and was transferred to assisted living.   While at assisted living, she tried to regain her strength and was eating and drinking less than normal because she reports she was not able to get up to the bathroom. The patient was found to have decreased mental status and was brought to the ER . Labs showed acute renal failure with BUN 91, creatinine 7.42, hypotension with hemoglobin 6.5. She was subsequently readmitted to the hospital last week.   She also had problems with painful swallowing. She says whenever she would eat or drink anything for the last two weeks, she has had pain with swallowing. Food is passing without dysphagia. There is just discomfort on and off. She says this discomfort is more of a burning sensation and the doctors tell her that it is not her heart. The patient has responded favorably to a GI cocktail and we are consulted for further recommendation.   The patient denies history of heartburn or indigestion at baseline. She denies history of ulcers. She has had antibiotic exposure.   The patient is tolerating a diabetic diet with solid foods  as well as liquids. It is just painful to swallow. She denies any specific abdominal pain, nausea or vomiting. She is unaware of blood or melena in the stool.  She denies prior history of EGD.   PAST MEDICAL HISTORY:  1.  Diabetes mellitus.  2.  Hypertension.  3.  Diastolic congestive heart failure.  4.  COPD, on chronic 2 liters nasal cannula.   5.  OSA.  6.  Obesity.  7.  History of DVT.  8.  History of pulmonary embolism with subarachnoid hemorrhage, admission July 2014.   PAST SURGICAL HISTORY:  1.  Hysterectomy.  2.  Cholecystectomy.  3.  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  4.  Cardiac catheterization, August 2013, negative for obstruction.   MEDICATIONS: See admission record as the patient does not know what she is taking.   ALLERGIES: PENICILLIN YIELDS HIVES AND SWELLING.   HABITS: Negative tobacco or alcohol. Remote history of tobacco use.   FAMILY HISTORY: Positive for heart disease in the family.   REVIEW OF SYSTEMS: The patient says she is feeling pretty well now except for the burning with swallowing. She reports no chest pain or shortness of breath at rest. Chronic significant left leg swelling and she uses a compression device at night. She says this has been present and stable for many years. She currently denies any chest pain except when she swallows. She has burning down the esophagus. See history of present illness. She denies diarrhea.    She has had exposure to steroids with leukocytosis. She denies fevers or chills.  She is on antibiotic therapy with negative cultures, negative C. difficile, and negative WBCs in the stool. The remaining 10 systems otherwise negative.   PHYSICAL EXAMINATION:  VITAL SIGNS: 98.9, 104, 29, 107/51.  GENERAL: Obese African American female sitting in the ICU chronically ill in appearance.   HEENT: Head is normocephalic. Conjunctivae pink. Sclerae anicteric.  Oral mucosa with tongue with white coating consistent with oral thrush.  NECK:  Obese. Central line in place, right neck.  HEART: Heart tones S1 and S2. Tachycardia noted.  LUNGS: CTA. Respirations are nonlabored.  ABDOMEN: Soft, nontender. Bowel sounds present.  RECTAL: Deferred.  EXTREMITIES: Lower extremities with gross edema in the entire left leg to the foot and greater than 2 times in size of the right leg. This is baseline.  SKIN: Without rash, and posterior skin not evaluated.  MUSCULOSKELETAL: No tenderness to palpation in the midsternal region. Gait not evaluated. The patient is not out of bed.  NEUROLOGIC: The patient is a reasonable historian. Speech is clear. Maintains good eye contact. Follows conversation easily and is oriented.  PSYCHIATRIC: Affect and mood very pleasant, cooperative.   LABORATORY DATA: Today, 02/09/2013, with glucose 137, BUN 28, creatinine 1.28. Electrolytes unremarkable. CPK and troponin negative. WBC 21.3, hemoglobin 7.2, hematocrit 21.5, platelet count 153, and comparison with admission hemoglobin 8.3, and she decreased to 6.5, and with blood transfusion, hemoglobin increased to 8. Iron saturation was 10%,. TIBC 215, ferritin 1286.   RADIOLOGY: Ultrasound of the kidneys was unremarkable.   CHEST X-RAY: Most recent 02/04/2013 showed no placement of left jugular central venous catheter with tip in the right atrium. Shallow inspiration with prominent lung markings.   IMPRESSION: The patient is with esophageal burning when she eats or drinks with likely diagnosis of oral and esophageal candidiasis. The patient has been critically ill and has received antibiotic therapy. Possibility of significant candidiasis in the esophagus is high and likely the explanation for her discomfort. She also has risk for esophagitis, gastritis, or stress ulcer.   PLAN: Maximize proton pump inhibitor therapy. Treat her candidiasis with nystatin 5 mL every six hours. Will avoid Diflucan due to her tenuous renal status at this time. Dr. Vira Agar would be in favor of  doing upper endoscopy as an outpatient, possibly eight weeks after discharge. Because of her recent subarachnoid hemorrhage, arrest, chest compressions, and renal status, he would   like to avoid IV sedation and luminal investigation at this time. Transfuse for hemoglobin as needed. Stool cards to document GI blood loss.   These services provided by Denice Paradise, ANP, under collaborative agreement with Dr. Gaylyn Cheers.   Thank you for the consultation.    ____________________________ Janalyn Harder Jerelene Redden, ANP (Adult Nurse Practitioner) kam:np D: 02/09/2013 16:59:23 ET T: 02/09/2013 19:16:19 ET JOB#: [id number]  cc: Joelene Millin A. Jerelene Redden, ANP (Adult Nurse Practitioner), <Dictator> Janalyn Harder Sherlyn Hay, MSN, ANP-BC Adult Nurse Practitioner ELECTRONICALLY SIGNED 02/10/2013 11:29

## 2014-10-13 NOTE — Op Note (Signed)
PATIENT NAME:  Debra Saunders, Debra Saunders MR#:  [id number] DATE OF BIRTH:  03/10/1946  DATE OF PROCEDURE:  11/04/2012  PREOPERATIVE DIAGNOSIS: Soft tissue mass on the back.   POSTOPERATIVE DIAGNOSIS:  Soft tissue mass on the back.  OPERATION: Excision large soft tissue mass from the back.   SURGEON:  Mckinley Jewel, MD  ANESTHESIA: General.   COMPLICATIONS: None.   ESTIMATED BLOOD LOSS:  Less than 20 mL.   DRAINS:  None.   DESCRIPTION OF PROCEDURE: The patient was put to sleep in the supine position on the operating table, and after placed in the left lateral position, held in place with a bean bag. Patient had a large, approximately 10 cm soft mass located in the lower posterior chest to the right of the midline, and had a previous excision of this performed years ago. This mass was located more or less lateral to the previous incision. This area was prepped and draped out as sterile field. A vertical incision overlying the center of this mass was made and carefully deepened through the subcutaneous tissue and in the deep subcutaneous plane, a somewhat lobulated lipomatous mass was encountered. This was then carefully freed using blunt dissection primarily and cautery for control of bleeding. It was placed directly on the surface of the deep fascia overlying the muscle, and was dissected off from this and removed. It measured approximately 11 cm in maximal size. There did not appear to be any extension of this through the fascia. After ensuring hemostasis, the area was cleansed and then closed. The deeper tissues were closed with 2-0 Vicryl interrupted stitches, and the skin closed with subcuticular 4-0 Monocryl reinforced with Steri-Strips, and dry sterile dressing with Telfa and Tegaderm was placed. The patient tolerated the procedure well. There were no immediate problems encountered. She was subsequently extubated and returned to the recovery room in stable condition.     ____________________________ S.Robinette Haines, MD sgs:mr D: 11/04/2012 17:36:33 ET T: 11/04/2012 20:44:42 ET JOB#: [id number]  cc: Synthia Innocent. Jamal Collin, MD, <Dictator> [facility] Robinette Haines MD ELECTRONICALLY SIGNED 11/06/2012 8:07

## 2014-10-13 NOTE — Discharge Summary (Signed)
PATIENT NAME:  Debra, Saunders MR#:  [id number] DATE OF BIRTH:  Jul 19, 1945  DATE OF ADMISSION:  01/11/2013 DATE OF DISCHARGE:  01/16/2013  DISPOSITION:  Transfer to [facility].  REASON FOR TRANSFER:  Subarachnoid hemorrhage with neurosurgical and neurology consultation.    HISTORY OF PRESENT ILLNESS: This is a 69 year old African American female who was admitted on 07/22 with complaints of severe chest pain, substernal in nature. She has a past medical history of diabetes, hypertension, CHF, OSA, COPD on 2 liters chronically. She had a cardiac cath done a year ago, in August 2013, which was negative for any obstruction. She presented with severe substernal chest pain going down both sides of her breasts. She was seen by cardiology, Dr. Rockey Situ, here on the 23rd who noted she had a normal echo in 2013. She was following with Dr. Fletcher Anon in April 2014 for preop eval, undergoing seroma surgery on her back. She was found to be relatively stable with low preop risk of any cardiac dysfunction. She has had multiple negative workups for DVT given underlying chronic lower extremity edema. She was unresponsive to diuretics. She was in a compression boot at home with improvement. She began experiencing constant sharp, centralized chest pain, worse with inspiration and to touch since 07/21. There are changes with position. No exertional component, however, the patient is quite sedentary at baseline. She continues to use chronic O2.  She denies any associated shortness of breath, nausea, diaphoresis, worsening DOE, PND, orthopnea, palpitations, syncope. She has chronic left lower extremity edema for which she uses an inflatable boot. No leg pain. No recent illness or cardiac procedures. On July 22nd in the ED, her EKG revealed normal sinus rhythm without any ischemic changes. Cardiac markers were normal. A chest CT revealed no evidence of aortic dissection. There was thyroid calcifications, interstitial lung disease versus  pneumonitis, chronic changes consistent with COPD. She was admitted by the medicine service on July 22nd. Throughout the course of her hospitalization, she was continuing to be further worked up by cardiology. She had echo done, the official read is currently pending. She had a repeat CAT scan done on July 25th for continued chest pain and dyspnea. At that time, the impression was subtle pulmonary embolus in the subsegmental branches in the right lower lobe cannot be excluded. The heart size is normal, large areas of patent bilateral interstitial changes are again noted. These have improved slightly from prior examination. She was placed on to Xarelto on the 25th in addition to her prophylactic DVT heparin dose.   Today, at roughly 4:30 p.m., a code was called.  The patient was found to be unconscious on the side of her bed. Care partner entered the room after the daughter called the care partner saying that her mom just all of a sudden became unconscious. The care partner entered the room, saw the patient was unconscious, called a CODE BLUE.  ACLS  protocol was started.  The  patient had no pulse upon arrival per nursing report. She had 30 seconds of compressions, at which point the monitor was hooked up and she had return of spontaneous circulation after 30 seconds. On the monitor, rhythm strip showed that she had PVCs, PACs and sinus tachycardia. No defibrillation, no antiarrhythmics were given, no epinephrine or atropine had to be given at that time either. She started to wake up but was very lethargic and slurring of speech. She was not protecting her airway and was intubated at that time. She was taken  to the Intensive Care Unit at [facility] where she was stabilized, placed on mechanical ventilation and placed on propofol, fentanyl drip.   Further workup with a head CT  showed findings concerning for subtle subarachnoid hemorrhage projecting anteriorly within the right and left frontal lobe regions. This  appears to be coursed along with gyri. These findings are concerning again for a subarachnoid hemorrhage. The finding is also concerning for an aneurysm rupture. Neurosurgical consultation and a neurovascular interventional consultation are recommended. The patient was brought back from CT to the Intensive Care Unit room. Currently, her PT is 21.0, her INR is 2.0 and her PTT is 30. Given the findings of the new result, she will be given 2 units of FFP prior to transfer. I have called and spoken with Dr. Rosine Abe over at [facility] in [place]. He has graciously accepted the patient to the Intensive Care Unit for closer neurologic monitoring and possible neurosurgical consultation.   At the time of this transfer summary, her current labs were as follows: White cell count 21.7, hemoglobin 11.3, hematocrit 35.7, platelet count is 212. INR 2.0, PT 22.1, PTT 30.1. Troponin less than 0.02, CK-MB less than 0.05, CK is at 50. She did have a CMP that showed the following: Sodium 126, potassium 3.7, chloride 87, bicarb 30, BUN 45, creatinine 1.54, glucose is at 300. Her AST is 71, ALT 107, alk phos at 149, serum albumin is at 2.6.   Radiologic studies are as follows: CT head shows findings concerning for subtle subarachnoid hemorrhage projecting anteriorly within the right and left frontal lobe regions.  These findings are best appreciated on images 14 through 19. She has a chest x-ray that is currently pending. The patient is currently intubated and sedated on propofol and fentanyl.   During the code, her systolic blood pressure was noted to be in the 200s. Her vitals at the time of discharge are as follows: Blood pressure is 163/105, temperature is 98, pulse 110, pulse ox of 94% on 35% oxygen. Vent settings are as follows: She is on assist control, tidal volumes of 500, PEEP at 5, rate at 15.  ____________________________ Vilinda Boehringer, MD vm:cs D: 01/16/2013 18:05:58 ET T: 01/16/2013 18:34:09  ET JOB#: [id number]  cc: Vilinda Boehringer, MD, <Dictator> Vilinda Boehringer MD ELECTRONICALLY SIGNED 01/16/2013 19:08

## 2014-10-13 NOTE — Consult Note (Signed)
PATIENT NAME:  Debra Saunders, Debra Saunders MR#:  [id number] DATE OF BIRTH:  09/23/1945  DATE OF CONSULTATION:  01/14/2013  REFERRING PHYSICIAN:   CONSULTING PHYSICIAN:  Lucilla Lame, MD  REASON FOR CONSULTATION: Atypical chest pain.  HISTORY OF PRESENT ILLNESS: This patient is a 69 year old woman who comes in with what appeared to be chest pain and shortness of breath. The patient reports that the pain is worse with deep breaths. She has had a history of cardiac work-up with what was reported to be a negative cath. She has a history of COPD.  The patient reports that her pain is worse when she moves and when she takes a deep breath. There is more pain when she breathes in through her nose than when she breathes in through her mouth. She denies any nausea vomiting, diarrhea or constipation. The patient was found to have an elevated ESR and an elevated CRP.   PAST MEDICAL HISTORY: Diabetes, hypertension, CHF, COPD, hysterectomy, cholecystectomy.   ALLERGIES: PENICILLIN.  MEDICATIONS:   1.  Amlodipine. 2.  Aspirin. 3.  Chlorthalidone. 4.  Enalapril. 5.  Magnesium. 6.  Metformin.   SOCIAL HISTORY: The patient denies tobacco, alcohol or drug abuse. She used to smoke over 20 years ago.   FAMILY HISTORY: Noncontributory.  PHYSICAL EXAMINATION:  GENERAL: The patient is lying in bed in some distress with breathing deeply. VITAL SIGNS: Temperature 98.4, respirations 20, pulse 77, blood pressure 167/100.  HEENT: Normocephalic, atraumatic. Extraocular motor intact. Pupils equally round and reactive to light and accommodation without JVD and without lymphadenopathy.  LUNGS: Clear to auscultation bilaterally.  HEART: Regular rate and rhythm without murmurs, rubs or gallops.  ABDOMEN: Soft, nontender and nondistended without hepatosplenomegaly. Chest wall with pain in the costochondral region, on the left side, to 1 finger palpation.  EXTREMITIES: Without cyanosis, clubbing or edema. MUSCULOSKELETAL:  Pain  reproducible on light palpation of the chest wall.  NEUROLOGICAL: Grossly intact.  SKIN: Without any rashes or lesions.   ANCILLARY SERVICES: CT scan without any findings, except for possible interstitial pneumonitis and interstitial fibrosis with COPD.  ESR 66.   ASSESSMENT AND PLAN: This patient is a 69 year old woman who has what appears to be costochondritis with musculoskeletal pain of the chest wall. The patient is denying any dysphagia, nausea, vomiting, diarrhea or constipation. The elevated CRP and ESR is unlikely inflammatory bowel disease with no symptoms such as diarrhea or bloody bowel movements. There is also no need for the patient to undergo an EGD since she denies any dysphagia and esophageal spasms would not be tender to the touch as it is in this patient. The patient does need an outpatient colonoscopy because she is due for a colonoscopy. Otherwise, I would treat the patient with NSAIDs versus a tapering dose of steroids.   Thank you very much for involving me in the care of this patient. If you have any questions, please do not hesitate to call.  ____________________________ Lucilla Lame, MD dw:sb D: 01/14/2013 11:40:19 ET T: 01/14/2013 11:54:33 ET JOB#: [id number]  cc: Lucilla Lame, MD, <Dictator> Lucilla Lame MD ELECTRONICALLY SIGNED 01/14/2013 12:11

## 2014-10-13 NOTE — Consult Note (Signed)
General Aspect This is a 69 -year-old female with multiple chronic medical conditions including chronic diastolic heart failure with pulmonary hypertension. She has chronic left lower extremity swelling which has been evaluated in the past with venous duplex ultrasound on multiple occasions and was negative for DVT. Her echocardiogram  showed normal LV systolic function with mild diastolic dysfunction and evidence of elevated filling pressures. There was also moderate to severe pulmonary hypertension with peak systolic pressure of 56 mm Hg. she underwent cardiac catheterization in August of 2013 which showed no evidence of obstructive coronary artery disease. She was noted to be severely hypertensive with significant elevation in left ventricular end-diastolic pressure. she has known history of refractory hypertension. She was seen recently by me for this. I switched her from furosemide to chlorthalidone 25 mg once daily. She subsequently saw her primary care physician Dr. Gilford Rile  with improvement in blood pressure. She presented today with dizziness and fatigue during pulmonary rehabilitation and was noted to be bradycardic with heart rate in the 40s. She was feeling naseous the whole day.  No chest pain or dyspnea.   Physical Exam:   GEN no acute distress    HEENT red conjunctivae    NECK supple     RESP normal resp effort  clear BS     CARD Regular rate and rhythm  Murmur     Murmur Systolic     Systolic Murmur Out flow     ABD denies tenderness     LYMPH negative neck    EXTR negative cyanosis/clubbing, negative edema    PSYCH alert, A+O to time, place, person   Review of Systems:   Subjective/Chief Complaint dizziness and nausea    General: Fatigue     Skin: No Complaints     ENT: No Complaints     Eyes: No Complaints     Neck: No Complaints     Respiratory: No Complaints     Cardiovascular: No Complaints     Gastrointestinal: No Complaints     Genitourinary:  No Complaints     Vascular: No Complaints     Musculoskeletal: No Complaints     Neurologic: Dizzness     Hematologic: No Complaints     Endocrine: No Complaints     Psychiatric: No Complaints    Home Medications:  Medication Instructions Status  spironolactone 25 mg oral tablet 1 tab(s) orally once a day Active  amlodipine 5 mg oral tablet 1 tab(s) orally once a day Active  carvedilol 6.25 mg oral tablet 1 tab(s) orally 2 times a day Active  aspirin 81 mg oral tablet 1 tab(s) orally once a day Active  Vasotec 20 mg oral tablet 1 tab(s) orally 2 times a day Active  metformin 500 mg oral tablet 1 tab(s) orally 2 times a day Active  chlorthalidone 25 mg oral tablet 1 tab(s) orally once a day Active  Qvar 80 mcg/inh inhalation aerosol 1 puff(s) inhaled 2 times a day Active  Ventolin HFA 90 mcg/inh inhalation aerosol 2 puff(s) inhaled 4 times a day, As Needed- for Shortness of Breath  Active  Mag-Ox 400 1 tab(s) orally 2 times a day Active  oxygen 2 l/m oxygen @ 2 liter(s) via n/c 24 hours a day. Active   Lab Results:  Hepatic:  07-Feb-14 12:14    Bilirubin, Total 0.3   Alkaline Phosphatase 119   SGPT (ALT) 29   SGOT (AST) 31   Total Protein, Serum 8.2   Albumin, Serum  3.4  Routine Chem:  07-Feb-14 12:14    Hemoglobin A1c [facility]) - (The American Diabetes Association recommends that a primary goal of therapy should be <7% and that physicians should reevaluate the treatment regimen in patients with HbA1c values consistently >8%.)   Result Comment a1c - cbc reordered  Result(s) reported on 30 Jul 2012 at 03:25PM.   Result Comment cbc - qns to validate  Result(s) reported on 30 Jul 2012 at 01:14PM.   Result Comment POTASSIUM/AST - Slight hemolysis, interpret results with  - caution.  Result(s) reported on 30 Jul 2012 at 01:15PM.   Cholesterol, Serum 152   Triglycerides, Serum 153   HDL (INHOUSE) 51   VLDL Cholesterol Calculated 31   LDL Cholesterol Calculated 70 (Result(s)  reported on 30 Jul 2012 at 03:55PM.)   Glucose, Serum  138   BUN  22   Creatinine (comp) 0.78   Sodium, Serum  135   Potassium, Serum 3.9   Chloride, Serum 100   CO2, Serum 27   Calcium (Total), Serum 8.9   Osmolality (calc) 276   eGFR (African American) >60   eGFR (Non-African American) >60 (eGFR values <54m/min/1.73 m2 may be an indication of chronic kidney disease (CKD). Calculated eGFR is useful in patients with stable renal function. The eGFR calculation will not be reliable in acutely ill patients when serum creatinine is changing rapidly. It is not useful in  patients on dialysis. The eGFR calculation may not be applicable to patients at the low and high extremes of body sizes, pregnant women, and vegetarians.)   Anion Gap 8    15:29    Hemoglobin A1c ([facility]) 5.9 (The American Diabetes Association recommends that a primary goal of therapy should be <7% and that physicians should reevaluate the treatment regimen in patients with HbA1c values consistently >8%.)  Cardiac:  07-Feb-14 12:14    Troponin I < 0.02 (0.00-0.05 0.05 ng/mL or less: NEGATIVE  Repeat testing in 3-6 hrs  if clinically indicated. >0.05 ng/mL: POTENTIAL  MYOCARDIAL INJURY. Repeat  testing in 3-6 hrs if  clinically indicated. NOTE: An increase or decrease  of 30% or more on serial  testing suggests a  clinically important change)  Routine UA:  07-Feb-14 12:14    Color (UA) Yellow   Clarity (UA) Clear   Glucose (UA) Negative   Bilirubin (UA) Negative   Ketones (UA) Negative   Specific Gravity (UA) 1.017   Blood (UA) Negative   pH (UA) 5.0   Protein (UA) Negative   Nitrite (UA) Negative   Leukocyte Esterase (UA) Negative (Result(s) reported on 30 Jul 2012 at 01:49PM.)   RBC (UA) 1 /HPF   WBC (UA) <1 /HPF   Bacteria (UA) NONE SEEN   Epithelial Cells (UA) <1 /HPF   Mucous (UA) PRESENT (Result(s) reported on 30 Jul 2012 at 01:49PM.)  Routine Hem:  07-Feb-14 12:14    WBC (CBC) -   RBC (CBC)  -   Hemoglobin (CBC) -   Hematocrit (CBC) -   Platelet Count (CBC) -   MCV -   MCH -   MCHC -   RDW -   Neutrophil % -   Lymphocyte % -   Monocyte % -   Eosinophil % -   Basophil % -   Neutrophil # -   Lymphocyte # -   Monocyte # -   Eosinophil # -   Basophil # -   Bands -   Segmented Neutrophils -   Lymphocytes -  Variant Lymphocytes -   Monocytes -   Eosinophil -   Basophil -   Metamyelocyte -   Myelocyte -   Promyelocyte -   Blast-Like -   Other Cells -   NRBC -   Diff Comment 1 -   Diff Comment 2 -   Diff Comment 3 -   Diff Comment 4 -   Diff Comment 5 -   Diff Comment 6 -   Diff Comment 7 -   Diff Comment 8 -   Diff Comment 9 -   Diff Comment 10 - (Result(s) reported on 30 Jul 2012 at 01:14PM.)    15:29    WBC (CBC) 6.8   RBC (CBC) 4.35   Hemoglobin (CBC)  11.7   Hematocrit (CBC) 36.3   Platelet Count (CBC) 262   MCV 84   MCH 27.0   MCHC 32.3   RDW 14.4   Neutrophil % 62.2   Lymphocyte % 28.7   Monocyte % 6.0   Eosinophil % 2.0   Basophil % 1.1   Neutrophil # 4.2   Lymphocyte # 2.0   Monocyte # 0.4   Eosinophil # 0.1   Basophil # 0.1 (Result(s) reported on 30 Jul 2012 at 03:38PM.)   EKG:   Interpretation sinus bradycardia with sinus arrhythmia   Radiology Results:  XRay:    07-Feb-14 12:22, Chest Portable Single View   Chest Portable Single View    REASON FOR EXAM:    chest discomfort  COMMENTS:       PROCEDURE: DXR - DXR PORTABLE CHEST SINGLE VIEW  - Jul 30 2012 12:22PM     RESULT: The right lung is hypoinflated. The interstitial markings are   increased predominantly on the right but to a lesser extent on the left.   The cardiac silhouette is enlarged. The central pulmonary vascularity is   prominent. No pleural effusion is demonstrated.    IMPRESSION:  The findings suggest low-grade CHF with mild interstitial   edema especially on the right. Certainly interstitial pneumonia cannot be   absolutely excluded. A followup PA and  lateral chest x-ray with deep   inspiration would be useful.   Dictation Site: 2        Verified By: DAVID A. Martinique, M.D., MD    Penicillin: Hives, Swelling  Vital Signs/Nurse's Notes:  **Vital Signs.:   07-Feb-14 16:03   Vital Signs Type Admission   Temperature Temperature (F) 98.1   Celsius 36.7   Temperature Source oral   Pulse Pulse 62   Respirations Respirations 18   Systolic BP Systolic BP 712   Diastolic BP (mmHg) Diastolic BP (mmHg) 85   Mean BP 114   Pulse Ox % Pulse Ox % 92   Pulse Ox Activity Level  At rest   Oxygen Delivery Room Air/ 21 %  *Intake and Output.:   07-Feb-14 16:06   Weight Type admission   Weight Method Bed   Current Weight (lbs) (lbs) 217   Current Weight (kg) (kg) 98.4   Height Type stated   Height (ft) (feet) 5   Height (in) (in) 2   Height (cm) centimeters 157.4   BSA (m2) 1.9   BMI (kg/m2) 39.7     Impression 1. Sinus bradycardia: could be vasovagal related to nausea. She has been mildly bradycardiac on current dose of Carvedilol.  Keep off Carvedilol.  2. Chronic diastolic heart failure: she seems to be euvolemic. I don't recommend Lasix especially that she is  on Aldactone and Chlorothalidone.  3. Refractory hypertension: Continue home medications except Coreg.  If she remains stable, can discharge home tomorrow.   Electronic Signatures: [name] (MD)  (Signed 07-Feb-14 17:07)  Authored: General Aspect/Present Illness, History and Physical Exam, Review of System, Home Medications, Labs, EKG , Radiology, Allergies, Vital Signs/Nurse's Notes, Impression/Plan   Last Updated: 07-Feb-14 17:07 by [name] (MD)

## 2014-10-13 NOTE — Consult Note (Signed)
Brief Consult Note: Diagnosis: Chest pain with increased CRP and ESR. Worse with deep breath and reproducable with one finger palpation of the left chest wall.   Patient was seen by consultant.   Comments: The patient appears to have Costochondritis. She denies any GI symptoms at this time and says antiinflammatories have helped her pain. I would recommend NSAID's vs. steroid tapper. Unlikely IBD with no GI symptoms. Although increased pain with breathing, Pleurisy, less likely with pain to palpation. No need for EGD at this time. Would recommend out patient follow up for colonoscopy because patient is due for it. Thnak you for the consult. Please call with any questions.  Electronic Signatures: Lucilla Lame (MD)  (Signed 25-Jul-14 10:59)  Authored: Brief Consult Note   Last Updated: 25-Jul-14 10:59 by Lucilla Lame (MD)

## 2014-10-13 NOTE — Discharge Summary (Signed)
PATIENT NAME:  Debra Saunders, Debra Saunders MR#:  [id number] DATE OF BIRTH:  1946/04/16  DATE OF ADMISSION:  02/02/2013 DATE OF DISCHARGE:  02/18/2013  Original interim discharge summary done by Dr. Darvin Neighbours.  DISPOSITION: To [facility].   CONSULTATIONS: Nephrology consult with Dr. Murlean Iba, cardiology consult with Dr. Esmond Plants, GI consult with Dr. Vira Agar, palliative care consult, Dr. Ermalinda Memos.     DISCHARGE DIAGNOSES: 1.  Septic shock due to Streptococcus agalactiae bacteremia, resolved.    2.  Acute renal failure with hyperkalemia secondary to (rhabdo> and also sepsis. The patient's renal failure has resolved. She was on continuous renal replacement therapy.  Now that the dialysis catheter is removed, she does have been good urine output.  3.  Metabolic encephalopathy secondary to sepsis and renal failure, resolved.  4.  Musculoskeletal chest pain.  Still clinically improving with steroids and fentanyl patch.  5.  History of pulmonary embolus with subarachnoid hemorrhage. The patient is not on anticoagulation because of this.  6.  Mildly elevated troponins secondary to demand ischemia.  7.  Gastritis.  8.  Anemia of chronic disease.  9.  Subarachnoid hemorrhage.  10.  History of sleep apnea.  11.  Diabetes mellitus type 2.  12. History of deep vein thrombosis and pulmonary embolism.  Off Xarelto secondary to subarachnoid hemorrhage.   DISCHARGE MEDICATIONS:   1.  Pantoprazole 40 mg p.o. b.i.d. 2.  Norvasc 5 mg p.o. daily. 3.  Coreg 3.125 mg p.o. b.i.d. 4.  Fentanyl patch 50 mcg every 3 days. That can be weaned off slowly once her chest pain resolves. 5.  Prednisone 60 mg p.o. daily. Continue for about 3 days and then go down to 50 mg daily for 2 days, then 40 mg for 2 days, 30 mg for 2 days, 20 mg for 2 days, then 10 mg for 2 days and then stop.  6.  She is on GI cocktail 40 mL every 6 hours.  7.  She is on Dilaudid 2 mg can be given every 4 hours as needed for pain in her chest.   8.   She is on Skelaxin 800 mg p.o. t.i.d. as needed for muscle pain.  9.  She is also on insulin sliding scale with coverage.    10.  Use MiraLAX as needed for constipation.  12.  She is on Glucerna 1 can p.o. b.i.d.   HOSPITAL COURSE:  Please refer to interim discharge summary done by Dr. Darvin Neighbours on 08/20. The patient is a 69 year old African American female with history of subarachnoid hemorrhage, secondary to being on Xarelto for PE and DVT. She came to hospital from nursing home because of altered mental status. The patient admitted to hospitalist service on 08/14. The patient was found to have creatinine of 7.4, admitted to ICU.  She also had septic shock due to pneumonia.   1.  Septic shock. The patient was on vancomycin, meropenem and Levaquin. Blood cultures showed Streptococcus agalactiae and she also needed dopamine for about 4 days. She finished the antibiotics after 14 days and then they were stopped. Repeat blood culture did not show any growth.  2.  Acute renal failure secondary to ATN from NSAIDS, sepsis and ACE inhibitors induced.  The ACE inhibitors were stopped on admission and she got a temporary dialysis catheter by [facility] and she received some fluids initially and then received dialysis. Now she has been off dialysis. Her creatinine is normal. The patient's urine output is good. We discontinued the Foley  yesterday. The patient is able to make urine output and Nephrology signed off on the case.   3.   Encephalopathy secondary to sepsis. Uremia resolved.   4.  Pleuritic chest pain and musculoskeletal chest pain. The patient is having severe reproducible chest pain. She received CPR on 08/[facility] at [facility].  The patient was seen by Cardiology and also GI. The patient was started on GI cocktail. Her chest pain improved with GI cocktail, but not completely gone, so palliative care has seen the patient and started her on fentanyl patch. An  ESR was also obtained. The patient's ESR is elevated up to  140, so she was started on prednisone 60 mg daily and that is actually helping with the pain.  Her pain is much better with 60 mg prednisone and fentanyl. She is able to participate in physical therapy and able to use the commode.  The patient will be on 60 mg prednisone for another 3 days and then taper it off daily and also can continue Dilaudid and also Percocet as needed and wean off those medications once the pain improves.  4.  Subarachnoid hemorrhage. Not on any anticoagulation.  5.  Acute on chronic anemia. Received blood transfusion. Hemoglobin is stable.  6.  History of sleep apnea. Continue CPAP.   7.  The patient is going to go to rehab at [facility].  8. Diabetes mellitus type 2. Continue sliding scale with coverage at this time. Metformin is on hold because she is just recovering from renal failure. Last kidney function: BUN is 36, creatinine 0.9 on 08/22. Hemoglobin on 08/27 7.1.   The patient is stable for discharge.   Time spent on discharge preparation and reviewing all interim discharge summary took more than 30 minutes.   ____________________________ Epifanio Lesches, MD sk:dp D: 02/18/2013 11:55:00 ET T: 02/18/2013 13:33:00 ET JOB#: [id number]  cc: Epifanio Lesches, MD, <Dictator> Epifanio Lesches MD ELECTRONICALLY SIGNED 03/04/2013 22:21

## 2014-10-13 NOTE — H&P (Signed)
PATIENT NAME:  Debra Saunders, Debra Saunders MR#:  [id number] DATE OF BIRTH:  09-08-1945  DATE OF ADMISSION:  01/11/2013  PRIMARY CARE PROVIDER: Dr. Ronette Deter   ED REFERRING PHYSICIAN:  Dr. Jimmye Norman  CHIEF COMPLAINT:  Severe chest pain, substernal in nature.   HISTORY OF PRESENT ILLNESS: The patient is a 69 year old, African-American female with a history of diabetes, hypertension, CHF, obstructive sleep apnea, COPD on 2 liters, who had a cardiac cath done last year in August 2013, which was negative for any obstruction. She presents with complaint of severe substernal chest pain going down both of her breasts. The patient was evaluated in the ED, and her EKG was nonrevealing. Cardiac enzymes were negative. She underwent a CT of the chest, per PE protocol, which failed to show any PE, any dissection. The patient is continuing to be very symptomatic, and were asked to admit the patient. She has received morphine, and it helped her for 10 minutes, but then her pain returned. She otherwise denies any nausea, vomiting or diarrhea. No abdominal pain. No lower extremity swelling.  PAST MEDICAL HISTORY:  1.  Diabetes.  2.  Hypertension.  3.  History of CHF, felt to be likely due to diastolic CHF. 4.  Obstructive sleep apnea, on CPAP. 5.  COPD, on 2 liters of home O2.   PAST SURGICAL HISTORY:  1.  Status post hysterectomy.  2.  Status post tumor removal from her back. 3.  Status post cholecystectomy.   ALLERGIES:  PENICILLIN.  CURRENT MEDICATIONS:  She is on amlodipine 5 daily, aspirin 81, 1 tab p.o. daily, chlorthalidone 25 p.o. daily, enalapril 20, 1 tab p.o. b.i.d., magnesium oxide 400, 1 tab p.o. b.i.d., metformin 500, 1 tab p.o. b.i.d.   SOCIAL HISTORY: Lives at home with one of her daughters. Denies any smoking, has not smoked in more than 23 years. No alcohol use.   FAMILY HISTORY:  Significant for mother with CHF.   REVIEW OF SYSTEMS:  CONSTITUTIONAL:  Denies any fevers. No fatigue. No  weakness. Complains of sharp substernal chest pain. No weight loss. No weight gain.  EYES: No blurred or double vision. No pain. No redness or inflammation. No glaucoma or cataracts.  EARS, NOSE, THROAT:  No tinnitus. No ear pain. No hearing loss. No seasonal or year-round allergies. No difficulty swallowing.  RESPIRATORY: Denies any cough, wheezing. No hemoptysis. Has COPD.   CARDIOVASCULAR: Complains of substernal chest pain. Denies any orthopnea, edema or arrhythmia. No palpitations.  GASTROINTESTINAL: No nausea, vomiting, diarrhea. No abdominal pain. No hematemesis. No melena. No ulcer. No GERD. No IBS. No jaundice.  GENITOURINARY:  Denies any dysuria, hematuria, renal calculus, frequency.  ENDOCRINE: Denies any polyuria, nocturia or thyroid problems.  HEMATOLOGIC/LYMPHATIC: Denies any major bruisability or bleeding.  SKIN:  No acne. No rash. No changes in mole, hair or skin.  MUSCULOSKELETAL:  Denies any pain in  neck, back or shoulder.  NEUROLOGIC:  No numbness. No CVA. No TIA. No seizures.  PSYCHIATRIC:  No anxiety. No insomnia. No ADD.   PHYSICAL EXAMINATION: VITAL SIGNS:  Temperature 99.3, pulse 74, respirations 24, blood pressure 153/76, O2 is 94%.  GENERAL:  The patient is an obese, African-American female in distress secondary to pain.  Her pain worsens with any movement.  HEENT: Head atraumatic, normocephalic. Pupils equally round, react to light and accommodation. There is no conjunctival pallor. No scleral icterus. Nasal exam shows no drainage. No ulceration. There are no nasal lesions. Oropharynx is clear, without any exudate.  External  ear exam shows no drainage or ulceration.  NECK:   No thyromegaly. No carotid bruits.  CARDIOVASCULAR:  Regular rate and rhythm. No murmurs, rubs, clicks or gallops. PMI is not displaced.  LUNGS:  Clear to auscultation bilaterally without any rales, rhonchi, wheezing.  ABDOMEN: Soft, nontender, nondistended. Positive bowel sounds x 4. There is no  hepatosplenomegaly.  SKIN:  No rash.  LYMPHATICS:  No lymph nodes palpable.  MUSCULOSKELETAL:  She has significant reproducible chest pain with palpation in her sternal region.  LYMPHATICS:  No lymph nodes palpable.  VASCULAR:  Good DP, PT pulses.  PSYCHIATRIC:  Not anxious or depressed.  NEUROLOGIC:  Awake, alert, oriented x 3. No focal deficits.   EVALUATIONS IN THE ED:  Glucose 142, BUN 8, creatinine 0.69, sodium 136, potassium 3.2, chloride 101, CO2 is 30, calcium 9.0. LFTs:  Total protein 8.3, albumin 3.3, bili total 0.3, alk phos 126, AST 45, ALT 42. CPK 99, CK-MB 0.7. Troponin less than 0.02. WBC 7.9, hemoglobin 11.5, platelet count was 240. EKG showed normal sinus rhythm without any  specific ST-T wave changes.   ASSESSMENT AND PLAN: The patient is a 69 year old who presents with severe substernal chest pain, reproducible in nature. Negative CT scan of the chest.   1.  Severe chest pain. It is reproducible, likely musculoskeletal in nature. She had a cath in August 2013, which was negative. At this time, I will place her on IV schedule of Toradol, p.r.n. Dilaudid for pain, serial cardiac enzymes. I will get an echocardiogram to make sure she does not have a pericarditis or any other type of inflammatory process going on. I will ask her cardiologist to evaluate.  2.  Diabetes. Will place her on sliding scale. Hold metformin due to IV contrast. 3.  Hypokalemia. Will replace her potassium.  4.  Obstructive, sleep apnea. Will use CPAP at bedtime.  5.  Chronic obstructive pulmonary disease. Continue oxygen at 2 liters as taking at home.   Note:  45 minutes spent on H and P.     ____________________________ Lafonda Mosses. Posey Pronto, MD shp:mr D: 01/11/2013 19:43:58 ET T: 01/11/2013 20:45:26 ET JOB#: [id number]  cc: Deondrick Searls H. Posey Pronto, MD, <Dictator> Alric Seton MD ELECTRONICALLY SIGNED 01/24/2013 9:00

## 2014-10-13 NOTE — Consult Note (Signed)
General Aspect Debra Saunders is a 69yo African American female w/ PMHx s/f chronic diastolic CHF, HTN, OSA (on CPAP), PAH (felt secondary to OSA, obesity, CHF), COPD, chronic respiratory failure (on home O2), sinus bradycardia and DM2 who was hospitalized at [facility] last night for chest pain.   She last followed up with Dr. Fletcher Anon in 09/2012 for pre-op eval prior to undergoing seroma surgery on her back. She was found to be relatively stable with pre-op low-moderate risk mostly attributed to poor pulmonary function.   Echo 2013- normal LV systolic function, mild diastolic dysfunction, elevated filling pressures and moderate-severe PAH (peak systolic pressure 56 mmHg).   Cardiac catheterization 01/2012- no evidence of obstructive coronary artery disease. She was noted to be severely hypertensive with significant elevation in left ventricular end-diastolic pressure.    She has been on home oxygen due to documented hypoxia. She follows up with Dr. Lake Bells as well, most recently 10/2012. She finished pulmonary rehabilitation with improvement in functional status. CPAP re-calibrated and home O2 continued. No indication for pulmonary vasodilator.   She has had multiple negative work-ups for DVT given underlying chronic LE edema. Unresponsive to diuretics. She wears a ompression boot at home with improvement.   She began experiencing constant sharp centralized chest pain worse on inspiration and to the touch since 7/21. There are changes with position. No exertion component, however the patient is quite sedentary at baseline. She continues to use chronic O2. Denies associated shortness of breath, nausea, diaphoresis, worsening DOE, PND, orthopnea, palpitations or syncope. She has chronic LLE edema for which she uses an inflatable boot. No leg pain. No recent illnesses or cardiac procedures.   Present Illness In the ED, EKG revealed NSR w/o ischemic changes. Cardiac markers WNL. CMP- K 3.2, albumin 3.3 otherwise  unremarkable. CBC unremarkable. U/a unremarkable. Chest CT revealed no evidence of aortic dissection or PE. There were thyroid calcifications, interstitial lung dz vs pneumonitis, changes c/w COPD. She was admitted by the medicine service. Two subsequent cardiac biomarkers WNL. K 3.3 this AM. TSH WNL. Echo pending.  PAST MEDICAL HISTORY:  1.  Diabetes.  2.  Hypertension.  3.  History of CHF, felt to be likely due to diastolic CHF. 4.  Obstructive sleep apnea, on CPAP. 5.  COPD, on 2 liters of home O2.   PAST SURGICAL HISTORY:  1.  Status post hysterectomy.  2.  Status post tumor removal from her back. 3.  Status post cholecystectomy.   ALLERGIES:  PENICILLIN.  CURRENT MEDICATIONS:  She is on amlodipine 5 daily, aspirin 81, 1 tab p.o. daily, chlorthalidone 25 p.o. daily, enalapril 20, 1 tab p.o. b.i.d., magnesium oxide 400, 1 tab p.o. b.i.d., metformin 500, 1 tab p.o. b.i.d.   SOCIAL HISTORY: Lives at home with one of her daughters. Denies any smoking, has not smoked in more than 23 years. No alcohol use.   FAMILY HISTORY:  Significant for mother with CHF.   Physical Exam:  GEN well developed, obese, in apparent pain   HEENT hearing intact to voice, moist oral mucosa, Oropharynx clear   NECK supple  No masses  trachea midline   RESP normal resp effort  clear BS  no use of accessory muscles   CARD Regular rate and rhythm  Normal, S1, S2  No murmur  acute point tenderness on palpation to sternum   ABD denies tenderness  positive liver/spleen enlargement  soft  normal BS   EXTR negative cyanosis/clubbing, LLE nonpitting edema, calor, nontender   SKIN  normal to palpation   NEURO follows commands, motor/sensory function intact   PSYCH alert, A+O to time, place, person   Review of Systems:  Subjective/Chief Complaint chest pain on palpation   General: Fatigue   Skin: No Complaints    ENT: No Complaints    Eyes: No Complaints    Neck: No Complaints    Respiratory:  Short of breath   Cardiovascular: Chest pain or discomfort  chest pain on palpiation and inspiration   Gastrointestinal: No Complaints    Genitourinary: No Complaints    Vascular: No Complaints  edema   Musculoskeletal: No Complaints    Neurologic: No Complaints    Hematologic: No Complaints    Endocrine: No Complaints    Psychiatric: No Complaints    Review of Systems: All other systems were reviewed and found to be negative   Medications/Allergies Reviewed Medications/Allergies reviewed           Admit Diagnosis:   CHEST PAIN: Onset Date: 12-Jan-2013, Status: Active, Description: CHEST PAIN  Lab Results:  Thyroid:  23-Jul-14 04:35   Thyroid Stimulating Hormone 1.11 (0.45-4.50 (International Unit)  ----------------------- Pregnant patients have  different reference  ranges for TSH:  - - - - - - - - - -  Pregnant, first trimetser:  0.36 - 2.50 uIU/mL)  Routine Chem:  23-Jul-14 04:35   Glucose, Serum  212  BUN 13  Creatinine (comp) 0.94  Sodium, Serum  134  Potassium, Serum  3.3  Chloride, Serum 99  CO2, Serum 29  Calcium (Total), Serum  8.4  Anion Gap  6  Osmolality (calc) 275  eGFR (African American) >60  eGFR (Non-African American) >60 (eGFR values <56m/min/1.73 m2 may be an indication of chronic kidney disease (CKD). Calculated eGFR is useful in patients with stable renal function. The eGFR calculation will not be reliable in acutely ill patients when serum creatinine is changing rapidly. It is not useful in  patients on dialysis. The eGFR calculation may not be applicable to patients at the low and high extremes of body sizes, pregnant women, and vegetarians.)  Cholesterol, Serum 140  Triglycerides, Serum 123  HDL (INHOUSE) 54  VLDL Cholesterol Calculated 25  LDL Cholesterol Calculated 61 (Result(s) reported on 12 Jan 2013 at 07:17AM.)  Cardiac:  23-Jul-14 04:35   Troponin I < 0.02 (0.00-0.05 0.05 ng/mL or less: NEGATIVE  Repeat testing in  3-6 hrs  if clinically indicated. >0.05 ng/mL: POTENTIAL  MYOCARDIAL INJURY. Repeat  testing in 3-6 hrs if  clinically indicated. NOTE: An increase or decrease  of 30% or more on serial  testing suggests a  clinically important change)  Routine UA:  23-Jul-14 00:10   Color (UA) Yellow  Clarity (UA) Hazy  Glucose (UA) 50 mg/dL  Bilirubin (UA) Negative  Ketones (UA) Trace  Specific Gravity (UA) 1.047  Blood (UA) Negative  pH (UA) 5.0  Protein (UA) Negative  Nitrite (UA) Negative  Leukocyte Esterase (UA) Negative (Result(s) reported on 12 Jan 2013 at 12:38AM.)  RBC (UA) 1 /HPF  WBC (UA) 1 /HPF  Bacteria (UA) NONE SEEN  Epithelial Cells (UA) <1 /HPF  Mucous (UA) PRESENT (Result(s) reported on 12 Jan 2013 at 12:38AM.)   EKG:  Interpretation EKG shows NSR, LAD, LVH, no ST/T changes, moderate artifact   Rate 82    Penicillin: Hives, Swelling  Vital Signs/Nurse's Notes: **Vital Signs.:   23-Jul-14 07:52  Vital Signs Type Routine  Temperature Temperature (F) 99  Celsius 37.2  Temperature Source oral  Pulse  Pulse 82  Respirations Respirations 22  Systolic BP Systolic BP 709  Diastolic BP (mmHg) Diastolic BP (mmHg) 82  Mean BP 103  Pulse Ox % Pulse Ox % 94  Pulse Ox Activity Level  At rest  Oxygen Delivery 2L    Impression 69yo African American female w/ PMHx s/f chronic diastolic CHF, HTN, OSA (on CPAP), PAH (felt secondary to OSA, obesity, CHF), chronic respiratory failure (on home O2), sinus bradycardia who was hospitalized at [facility] last night for chest pain.  1. Atypical chest pain constant sharp central chest pain worse on inspiration, palpation and position changes for 2 to 3 days. EKG reveals no evidence of ischemia or pericarditis-type changes.  Chest CT w/o evidence of PE or dissection.  There is underlying interstitial lung dz vs pneumonitis and mild CHF.   She has effectively ruled out overnight. Chest pain is reproducible and point tender on palpation.  Suspect a musculoskeleta/neurologic inflammatory etiology. She is hypokalemic which may induce muscle spasms. Otherwise costochondritis or anterior chest pain syndrome would also support her presentation.  -- Recommend supportive treatment -- Replete K -- Review 2D echo No stress testing at this time.  2. Hypokalemia Potassium 3.2 yesterday, 3.3 today. -- Replete K  3. LLE edema Chronic, LLE>RLE. She has had mutliple LE dopplers in the past which were negative for DVT. She wears inflatable boots with improvement as an outpatient. Not improved by diuretics. Suspect lymphedema.  -- Continue compression boot  4. Chronic diastolic CHF There were changes on chest CT which indicated mild CHF. Euvolemic on exam. With hypokalemia, would be hesitant to increase diuretic regimen. She will likely need potassium supplementation as she takes chlorthalidone for HTN.  -- Review 2D echo  5. HTN Fairly well-controlled considering her underlying pain. Continue CCB, diuretic and ACEi in this diabetic, African-American female.  -- As above, potassium supplementation recommended given hypokalemia and thiazide diuretic therapy.   Plan 6. PAH Stable. Mod-severe by 01/2012 cath. Secondary to OSA, COPD, obesity and diastolic dysfunction. No indication for pulmonary vasodilators at this time.  -- Continue current management   Electronic Signatures: Debra Saunders (PA-C)  (Signed 23-Jul-14 09:45)  Authored: General Aspect/Present Illness, History and Physical Exam, Review of System, Home Medications, Labs, EKG , Allergies, Vital Signs/Nurse's Notes, Impression/Plan Debra Saunders (MD)  (Signed 23-Jul-14 18:08)  Authored: History and Physical Exam, Review of System, Health Issues, EKG , Impression/Plan  Co-Signer: General Aspect/Present Illness, Home Medications, Allergies   Last Updated: 23-Jul-14 18:08 by Debra Saunders (MD)

## 2014-10-13 NOTE — Consult Note (Signed)
PATIENT NAME:  Debra Saunders, Debra Saunders MR#:  [id number] DATE OF BIRTH:  1945/07/11  DATE OF CONSULTATION:  01/14/2013  REFERRING PHYSICIAN:  Gladstone Lighter, MD.  CONSULTING PHYSICIAN:  Meda Klinefelter., MD.  REASON FOR CONSULTATION: Chest pain and elevated sedimentation rate.   HISTORY OF PRESENT ILLNESS: A 69 year old African American female. She has history of lung disease followed by Dr. Lake Bells. She has had sleep apnea, bullous emphysema, interstitial lung disease. She has been on chronic oxygen, also by her report, she has had congestive heart failure. She had a colonoscopy last year and a catheterization preop which, by her report, was normal for coronary disease.   She has not been on medicines for pulmonary hypertension. She has had chronic left leg swelling and has had to use pumps in the past. She is diabetic. She had sudden onset 5 days ago of  anterior chest pain, felt like she had the flu with fever but never had a rash. It hurt her to move. She pointed to the sternum in the left upper chest. It hurts with rotation, coughing, moving around in the bed. She remembers no injury. She walks with a walker but had not been heavily walking or had chest pain when she was walking.   She has normal white count, no temperature. Sed rate and CRP were elevated. Chest CT showed bullous emphysema and interstitial lung disease. Creatinine has been reasonable. There has been no rash. She has had no blisters or itching.   There has been no Raynaud's. She has chronic leg swelling. Has not had any significant knee pain, hand pain. No triggering. CPK is normal.   PAST MEDICAL HISTORY: Pulmonary hypertension, interstitial - bullous lung disease, sleep apnea, morbid obesity, diabetes.   PAST SURGICAL HISTORY: Hysterectomy, back surgery, status post cholecystectomy.   SOCIAL HISTORY: Used to work at a [employer]. Prior cigarettes.   FAMILY HISTORY: Negative for connective tissue  diseases.  REVIEW OF SYSTEMS: Otherwise as above.   PHYSICAL EXAMINATION:  VITAL SIGNS: Afebrile. Blood pressure 167/100, pulse 77, oxygen saturation 90.  HEENT: Sclerae clear. Clear oropharynx. Good carotid upstroke. No thyromegaly.  CHEST: Clear.  She is tender anterior chest wall along the left ribs as well as the manubrium  sternum and the sternum. Right chest is not particularly tender. Squeezing the ribs causes some pain. There is no definite rub or murmur.  ABDOMEN: No visceromegaly. Nontender abdomen. EXTREMITIES: Chronic left leg edema which is almost twice the size of the right leg with brawny changes. Hands with no synovitis. There is no sclerodactyly. No telangiectasias.  MUSCULOSKELETAL: Moves shoulder and neck well. She moves her hips reasonably. Knees without definite effusions.  NEUROLOGIC: Oriented.   IMPRESSION:  1.  Chest wall pain out of proportion to costochondritis. Rule out chondral inflammation or sternal fracture. No response to steroids.  2.  Chronic lung disease with bullous changes but also interstitial changes with sleep apnea and pulmonary hypertension on chronic oxygen.  3.  Diabetes.  4.  Cannot completely rule out pulmonary embolism with her left leg swelling and venostasis.  5.  Elevated sedimentation rate and CRP may be multifactorial.   RECOMMENDATIONS:  1.  Consider other imaging of her chest wall such as a sternal CT or a bone scan.  2.  Consider V/Q scan versus chest CT with PE protocol to rule out pulmonary embolism.   ____________________________ Meda Klinefelter., MD gwk:np D: 01/14/2013 14:00:00 ET T: 01/14/2013 15:43:57 ET JOB#: [id number]  cc:  Jennifer A. Gilford Rile, MD Meda Klinefelter., MD, <Dictator>    Ovidio Hanger MD ELECTRONICALLY SIGNED 01/17/2013 13:05

## 2014-10-13 NOTE — Consult Note (Signed)
CHIEF COMPLAINT and HISTORY:  Subjective/Chief Complaint ARF   History of Present Illness Patient critically ill, in ARF with high K and volume overload.  Needs urgent CRRT.  Can not provide much history   PAST MEDICAL/SURGICAL HISTORY:  Past Medical History:   DVT:    sleep apena:    chronic back pain:    chf:    htn:    tumor removed frome back:    gall bladder removed:    hysterectomy:   ALLERGIES:  Allergies:  Penicillin: Hives, Swelling  HOME MEDICATIONS:  Home Medications: Medication Instructions Status  enalapril 20 mg oral tablet 1 tab(s) orally 2 times a day for hypertension (9 am, 5 pm) Active  amlodipine 5 mg oral tablet 1 tab(s) orally once a day for hypertension (9 am) Active  carvedilol 3.125 mg oral tablet 1 tab(s) orally 2 times a day for hypertension (9 am, 5 pm) Active  docusate sodium 100 mg oral capsule 1 cap(s) orally 2 times a day for constipation (9 am, 5 pm) Active  famotidine 20 mg oral tablet 1 tab(s) orally 2 times a day for GERD (9 am, 5 pm) Active  levETIRAcetam 500 mg oral tablet 1 tab(s) orally 2 times a day for seizures (9 am, 5 pm) Active  pantoprazole 40 mg oral delayed release tablet 1 tab(s) orally once a day for GERD (9 am) Active  Xarelto 15 mg oral tablet 1 tab(s) orally 2 times a day for anticoagulation (7 am, 5 pm) Active  acetaminophen 325 mg oral tablet 2 tab(s) (650 mg) orally every 6 hours, As Needed - for Fever Active  albuterol CFC free 90 mcg/inh inhalation aerosol 2 puff(s) inhaled 4 times a day, As Needed - for Shortness of Breath Active  ibuprofen 200 mg oral tablet 2 tab(s) (400 mg) orally every 6 hours, As Needed - for Pain Active  nitroglycerin 0.4 mg sublingual tablet 1 tab(s) sublingual every 5 minutes, As Needed - for Chest Pain Active  Percocet 10/325 325 mg-10 mg oral tablet 1 tab(s) orally every 6 hours, As Needed - for Pain Active  polyethylene glycol 3350 - oral powder for reconstitution 17 gram(s) (1 scoop)  orally once a day, As Needed - for Constipation Active  metformin 500 mg oral tablet 1 tab(s) orally 2 times a day for diabetes mellitus (9 am, 5 pm) Active  magnesium oxide 400 mg oral tablet 1 tab(s) orally 2 times a day for supplement (9 am, 5 pm) Active   Family and Social History:  Family History Non-Contributory   Social History negative Illicit drugs   Place of Living Home   Review of Systems:  ROS Pt not able to provide ROS   Physical Exam:  GEN well developed, well nourished, critically ill appearing   HEENT pink conjunctivae, moist oral mucosa   NECK No masses  trachea midline   RESP postive use of accessory muscles  crackles   CARD regular rate  no JVD   VASCULAR ACCESS none   ABD soft  normal BS   LYMPH negative neck, negative axillae   EXTR positive edema   SKIN skin turgor good   NEURO cranial nerves intact, motor/sensory function intact   PSYCH agitated   LABS:  Laboratory Results: Thyroid:    14-Aug-14 04:41, Thyroid Stimulating Hormone  Thyroid Stimulating Hormone 0.447  0.45-4.50  (International Unit)   -----------------------  Pregnant patients have   different reference   ranges for TSH:   - - - - - - - - - -  Pregnant, first trimetser:   0.36 - 2.50 uIU/mL  LabObservation:    14-Aug-14 14:15, Echo Doppler  OBSERVATION   Reason for Test  Hepatic:    13-Aug-14 19:30, Comprehensive Metabolic Panel  Bilirubin, Total 1.4  Alkaline Phosphatase 299  SGPT (ALT) 41  SGOT (AST) 41  Total Protein, Serum 9.3  Albumin, Serum 2.3  Routine BB:    13-Aug-14 22:01, Crossmatch 1 Unit  Crossmatch Unit 1 Issued  Result(s) reported on 03 Feb 2013 at 11:17AM.    13-Aug-14 22:01, Type and Antibody Screen  ABO Group + Rh Type   B Positive  Antibody Screen NEGATIVE  Result(s) reported on 02 Feb 2013 at 11:29PM.  Routine Micro:    13-Aug-14 22:01, Blood Culture  Micro Text Report   BLOOD CULTURE    COMMENT                   IN ANAEROBE  BOTTLE ONLY    COMMENT                   ID TO FOLLOW    GRAM STAIN                GRAM POSITIVE COCCI IN CHAINS     ANTIBIOTIC  Culture Comment   IN ANAEROBE BOTTLE ONLY  Culture Comment .   ID TO FOLLOW  Gram Stain 1   GRAM POSITIVE COCCI IN CHAINS    13-Aug-14 22:31, Blood Culture  Micro Text Report   BLOOD CULTURE    COMMENT                   NO GROWTH IN 8-12 HOURS     ANTIBIOTIC  Culture Comment   NO GROWTH IN 8-12 HOURS   Result(s) reported on 03 Feb 2013 at 06:00AM.  Lab:    14-Aug-14 00:02, ABG  pH (ABG) 7.32  PCO2 41  PO2 73  FiO2 28  Base Excess -4.8  HCO3 21.1  O2 Saturation 96.1  O2 Device CANNULA  Specimen Site (ABG)   RT RADIAL  Specimen Type (ABG) ARTERIAL  Patient Temp (ABG) 37.0  Result(s) reported on 03 Feb 2013 at 12:13AM.  Cardiology:    13-Aug-14 18:55, ED ECG  Ventricular Rate 59  Atrial Rate 59  P-R Interval 128  QRS Duration 84  QT 416  QTc 411  R Axis -3  T Axis 14  ECG interpretation   Sinus bradycardia  Minimal voltage criteria for LVH, may be normal variant  Borderline ECG  When compared with ECG of 16-Jan-2013 15:48,  Vent. rate has decreased BY  30 BPM  Non-specific change in ST segment in Anterior leads  Nonspecific T wave abnormality now evident in Anterior leads  Nonspecific T wave abnormality no longer evident in Lateral leads  QT has shortened  ----------unconfirmed----------  Confirmed by OVERREAD, NOT ([id number]), editor PEARSON, BARBARA ([id number]) on 02/03/2013 8:48:36 AM  ED ECG     14-Aug-14 08:20, ECG  Ventricular Rate 65  Atrial Rate 65  P-R Interval 162  QRS Duration 92  QT 386  QTc 401  P Axis 50  T Axis 33  ECG interpretation   Normal sinus rhythm with sinus arrhythmia  Nonspecific T wave abnormality  Abnormal ECG  When compared with ECG of 02-Feb-2013 18:55,  Nonspecific T wave abnormality now evident in Lateral leads  Confirmed by [facility], SHAUKAT ([id number]) on 02/03/2013 8:34:11 AM    Overreader: Neoma Laming  ECG  Routine Chem:    13-Aug-14 19:30, Comprehensive Metabolic Panel  Glucose, Serum 161  BUN 91  Creatinine (comp) 7.42  Sodium, Serum 125  Potassium, Serum 5.4  Chloride, Serum 89  CO2, Serum 23  Calcium (Total), Serum 9.2  Osmolality (calc) 283  eGFR (African American) 6  eGFR (Non-African American) 5  eGFR values <45m/min/1.73 m2 may be an indication of chronic  kidney disease (CKD).  Calculated eGFR is useful in patients with stable renal function.  The eGFR calculation will not be reliable in acutely ill patients  when serum creatinine is changing rapidly. It is not useful in   patients on dialysis. The eGFR calculation may not be applicable  to patients at the low and high extremes of body sizes, pregnant  women, and vegetarians.  Anion Gap 13    13-Aug-14 19:30, Troponin I  Result Comment   TROPONIN - RESULTS VERIFIED BY REPEAT TESTING.   - NOTIFIED OF CRITICAL VALUE   - C/KATE ANDROCONIS AT 2012 BY CJF 8/13     - READ-BACK PROCESS PERFORMED.   Result(s) reported on 02 Feb 2013 at 08:14PM.    13-Aug-14 22:01, Blood Culture  Result Comment   ANAEROBIC BOTTLE - NOTIFIED OF CRITICAL VALUE   - DV TO LOLITA OWENS _0  02/03/13   - READ-BACK PROCESS PERFORMED.   Result(s) reported on 03 Feb 2013 at 12:42PM.    14-Aug-14 031:59 Basic Metabolic Panel (w/Total Calcium)  Glucose, Serum 207  BUN 98  Creatinine (comp) 7.48  Sodium, Serum 126  Potassium, Serum 5.2  Chloride, Serum 91  CO2, Serum 23  Calcium (Total), Serum 8.6  Anion Gap 12  Osmolality (calc) 290  eGFR (African American) 6  eGFR (Non-African American) 5  eGFR values <625mmin/1.73 m2 may be an indication of chronic  kidney disease (CKD).  Calculated eGFR is useful in patients with stable renal function.  The eGFR calculation will not be reliable in acutely ill patients  when serum creatinine is changing rapidly. It is not useful in   patients on dialysis. The eGFR calculation may not be applicable  to  patients at the low and high extremes of body sizes, pregnant  women, and vegetarians.    14-Aug-14 04:41, Hemoglobin A1c ([facility])  Hemoglobin A1c ([facility]) 6.2  The American Diabetes Association recommends that a primary goal of  therapy should be <7% and that physicians should reevaluate the  treatment regimen in patients with HbA1c values consistently >8%.    14-Aug-14 04:41, Lipid Profile ([facility])  Cholesterol, Serum 137  Triglycerides, Serum 197  HDL (INHOUSE) 25  VLDL Cholesterol Calculated 39  LDL Cholesterol Calculated 73  Result(s) reported on 03 Feb 2013 at 05[facility]   14-Aug-14 04:41, Magnesium, Serum  Magnesium, Serum 2.9  1.8-2.4  THERAPEUTIC RANGE: 4-7 mg/dL  TOXIC: > 10 mg/dL   -----------------------    14-Aug-14 04:41, Troponin I  Result Comment   TROPONON - RESULTS VERIFIED BY REPEAT TESTING.   - PREVIOUS CALL:02/02/13 AT 2012..TPL   Result(s) reported on 03 Feb 2013 at 05:34AM.    14-Aug-14 11:54, Troponin I  Result Comment   TROPONIN - RESULTS VERIFIED BY REPEAT TESTING.   - PREVIOUSLY CALLED 02-02-13.2012.CJF   Result(s) reported on 03 Feb 2013 at 01:07PM.    14-Aug-14 1445:85Basic Metabolic Panel (w/Total Calcium)  Glucose, Serum 152  BUN 101  Creatinine (comp) 7.45  Sodium, Serum 125  Potassium, Serum 5.5  Chloride, Serum 92  CO2, Serum 22  Calcium (Total), Serum 8.9  Anion Gap 11  Osmolality (calc) 286  eGFR (African American) 6  eGFR (Non-African American) 5  eGFR values <40m/min/1.73 m2 may be an indication of chronic  kidney disease (CKD).  Calculated eGFR is useful in patients with stable renal function.  The eGFR calculation will not be reliable in acutely ill patients  when serum creatinine is changing rapidly. It is not useful in   patients on dialysis. The eGFR calculation may not be applicable  to patients at the low and high extremes of body sizes, pregnant  women, and vegetarians.  Cardiac:    13-Aug-14 19:30, Cardiac Panel  CK, Total 34   CPK-MB, Serum < 0.5  Result(s) reported on 03 Feb 2013 at 02:28AM.    13-Aug-14 19:30, Troponin I  Troponin I 0.34  0.00-0.05  0.05 ng/mL or less: NEGATIVE   Repeat testing in 3-6 hrs   if clinically indicated.  >0.05 ng/mL: POTENTIAL   MYOCARDIAL INJURY. Repeat   testing in 3-6 hrs if   clinically indicated.  NOTE: An increase or decrease   of 30% or more on serial   testing suggests a   clinically important change    14-Aug-14 04:41, Cardiac Panel  CK, Total 48  CPK-MB, Serum < 0.5  Result(s) reported on 03 Feb 2013 at 0[facility]    14-Aug-14 04:41, Troponin I  Troponin I 0.44  0.00-0.05  0.05 ng/mL or less: NEGATIVE   Repeat testing in 3-6 hrs   if clinically indicated.  >0.05 ng/mL: POTENTIAL   MYOCARDIAL INJURY. Repeat   testing in 3-6 hrs if   clinically indicated.  NOTE: An increase or decrease   of 30% or more on serial   testing suggests a   clinically important change    14-Aug-14 11:54, Cardiac Panel  CK, Total 56  CPK-MB, Serum < 0.5  Result(s) reported on 03 Feb 2013 at 12:44PM.    14-Aug-14 11:54, Troponin I  Troponin I 0.31  0.00-0.05  0.05 ng/mL or less: NEGATIVE   Repeat testing in 3-6 hrs   if clinically indicated.  >0.05 ng/mL: POTENTIAL   MYOCARDIAL INJURY. Repeat   testing in 3-6 hrs if   clinically indicated.  NOTE: An increase or decrease   of 30% or more on serial   testing suggests a   clinically important change  Routine UA:    13-Aug-14 19:30, Urinalysis  Color (UA) Brown  Clarity (UA) Cloudy  Glucose (UA) Negative  Bilirubin (UA) 2+  Ketones (UA) Negative  Specific Gravity (UA) 1.030  Blood (UA) 3+  pH (UA) 6.0  Protein (UA) 25 mg/dL  Nitrite (UA) Negative  Leukocyte Esterase (UA) Negative  Result(s) reported on 02 Feb 2013 at 08:18PM.  RBC (UA) 704 /HPF  WBC (UA) 16 /HPF  Bacteria (UA) 2+  Epithelial Cells (UA)   NONE SEEN  Amorphous Crystal (UA) PRESENT  Result(s) reported on 02 Feb 2013 at 08:18PM.  Routine Hem:     13-Aug-14 19:30, Hemogram, Platelet Count  WBC (CBC) 14.6  RBC (CBC) 3.19  Hemoglobin (CBC) 8.3  Hematocrit (CBC) 25.6  Platelet Count (CBC) 292  Result(s) reported on 02 Feb 2013 at 07:45PM.  MCV 80  MCH 26.0  MCHC 32.4  RDW 15.6    14-Aug-14 04:41, CBC Profile  WBC (CBC) 12.8  RBC (CBC) 2.49  Hemoglobin (CBC) 6.5  Hematocrit (CBC) 19.8  Platelet Count (CBC) 266  MCV 80  MCH 26.3  MCHC 33.0  RDW 15.6  Neutrophil % 82.7  Lymphocyte %  8.1  Monocyte % 7.5  Eosinophil % 1.2  Basophil % 0.5  Neutrophil # 10.6  Lymphocyte # 1.0  Monocyte # 1.0  Eosinophil # 0.2  Basophil # 0.1  Result(s) reported on 03 Feb 2013 at 04:59AM.    14-Aug-14 14:50, CBC Profile  WBC (CBC) 14.4  RBC (CBC) 2.97  Hemoglobin (CBC) 8.0  Hematocrit (CBC) 24.1  Platelet Count (CBC) 265  MCV 81  MCH 26.7  MCHC 33.0  RDW 15.8  Neutrophil % 81.8  Lymphocyte % 6.4  Monocyte % 10.2  Eosinophil % 1.2  Basophil % 0.4  Neutrophil # 11.8  Lymphocyte # 0.9  Monocyte # 1.5  Eosinophil # 0.2  Basophil # 0.1  Result(s) reported on 03 Feb 2013 at 03:35PM.   RADIOLOGY:  Radiology Results: XRay:    07-Feb-14 12:22, Chest Portable Single View  Chest Portable Single View  REASON FOR EXAM:    chest discomfort  COMMENTS:       PROCEDURE: DXR - DXR PORTABLE CHEST SINGLE VIEW  - Jul 30 2012 12:22PM     RESULT: The right lung is hypoinflated. The interstitial markings are   increased predominantly on the right but to a lesser extent on the left.   The cardiac silhouette is enlarged. The central pulmonary vascularity is   prominent. No pleural effusion is demonstrated.    IMPRESSION:  The findings suggest low-grade CHF with mild interstitial   edema especially on the right. Certainly interstitial pneumonia cannot be   absolutely excluded. A followup PA and lateral chest x-ray with deep   inspiration would be useful.   Dictation Site: 2        Verified By: DAVID A. Martinique, M.D., MD    23-Jul-14  13:20, Chest PA and Lateral  Chest PA and Lateral  REASON FOR EXAM:    chest pain  COMMENTS:       PROCEDURE: DXR - DXR CHEST PA (OR AP) AND LATERAL  - Jan 12 2013  1:20PM     RESULT: Comparison: 07/30/2012    Findings:     PA and lateral chest radiographs are provided. There is bilateral diffuse   interstitial thickening likely representing interstitial edema versus   interstitial pneumonitis secondary to an infectious or inflammatory   etiology. There is no focal parenchymal opacity, pleural effusion, or   pneumothorax. The heart and mediastinum are unremarkable.  The osseous   structures are unremarkable.  IMPRESSION:     There is bilateral diffuse interstitial thickening likely representing   interstitial edema versus interstitial pneumonitis secondary to an   infectious or inflammatory etiology.    Dictation Site: 1        Verified By: Jennette Banker, M.D., MD    27-Jul-14 16:53, Chest Portable Single View  Chest Portable Single View  REASON FOR EXAM:    respiratroy failure, intubated  COMMENTS:       PROCEDURE: DXR - DXR PORTABLE CHEST SINGLE VIEW  - Jan 16 2013  4:53PM     RESULT: Frontal view the chest dated 01/16/2013    Findings: Patient's taken a shallow inspiration. An endotracheal tube is   appreciated with tip directed along the right mainstem bronchus   retraction of approximately 1.5 to 2 cm is recommended. An NG tube is   seen with tip not in view the study. Mild prominence of interstitial   markings. No focal recent consolidation identified. The cardiac   silhouette is prominent and the visualized bony skeleton  is unremarkable.    IMPRESSION:   1. Retraction of the patient's endotracheal tube is recommended as   described above.  2. Shallow inspiration with possibly a component of interstitial   infiltrate edematous versus inflammatory infectious. These findings are   mild considering technique.        Verified By: Mikki Santee, M.D., MD     13-Aug-14 19:44, Chest Portable Single View  Chest Portable Single View  REASON FOR EXAM:    Chest Pain  COMMENTS:       PROCEDURE: DXR - DXR PORTABLE CHEST SINGLE VIEW  - Feb 02 2013  7:44PM     RESULT: Comparison made to prior study of 01/16/2013. Endotracheal and NG   tubes been removed. Mild atelectasis versus infiltrates lung bases and   right upper lobe. Heart size normal. Pulmonary vascularity normal.   Mediastinum unremarkable. Degenerative changes thoracic spine.    IMPRESSION:  Mild atelectasis and/or infiltrates in the lower lobes and   right upper lobe. Heart size normal. Pulmonary vascularity normal.        Verified By: Osa Craver, M.D., MD  [place]:    06-Feb-14 14:17, US Soft Tissue Head/Neck/Thyroid  US Soft Tissue Head/Neck/Thyroid  REASON FOR EXAM:    thyroid nodules  COMMENTS:       PROCEDURE: KUS - KUS SOFT TISSUE HEAD/NECK/THYROI  - Jul 29 2012  2:17PM     RESULT: Comparison: None    Technique: Multiple gray-scale and color-flow Doppler images of the   thyroid gland were obtained.    Findings:  The right lobe of the thyroid measures 4.1 x 1.6 x 2.0 cm. The left lobe   of the thyroid measures 3.7 x 2.4 x 1.7 cm. The thyroid gland is somewhat   heterogeneous in echotexture, which is nonspecific. There are 3 cystic   nodules in the right lobe of the thyroid. The largest measures 1.3 x 1.0     x 0.9 cm. The others are subcentimeter in size. There is a small   hyperechoic focus along the deep margin of two of these nodules, which is   nonspecific.    There are 2 solid nodules in the left lobe of the thyroid. The largest is   located in the lower pole, measuring 2.0 x 1.5 x 1.5 cm. It is relatively   isoechoic to the background thyroid tissue. The other solid nodule is   located in the superior pole, measuring 1.2 x 1.1 x 0.9 cm.    IMPRESSION:   Multiple bilateral thyroid nodules. The 2 nodules in the left lobe of the   thyroid are solid, with the  largest nodule measuring 2.0 cm in greatest   dimension.    Please note, there are no sensitive or specific ultrasoundcriteria for     differentiating benign from malignant thyroid lesions.    Dictation site: 2        Verified By: Gregor Hams, M.D., MD    14-Apr-14 13:29, [place] Color Flow Doppler Low Extrem Left (Leg)  [place] Color Flow Doppler Low Extrem Left (Leg)  REASON FOR EXAM:    swelling  COMMENTS:       PROCEDURE: [place]  - US DOPPLER LOW EXTR LEFT  - Oct 04 2012  1:29PM     RESULT: A Doppler interrogation of the deep venous system of the left leg   is performed from the common femoral vein through the popliteal vein. The   study shows complete compressibility  of the deep venous systems. The   color Doppler and spectral Doppler appearance is normal.    IMPRESSION:   1. No evidence of left lower extremity deep vein thrombosis.  2. No evidence of a popliteal fossa cyst.    Dictation Site: 2    Verified By: Sundra Aland, M.D., MD    14-Aug-14 01:10, US Kidney Bilateral  US Kidney Bilateral  REASON FOR EXAM:    aki  COMMENTS:       PROCEDURE: [place]  - US KIDNEY  - Feb 03 2013  1:10AM     RESULT:     Technique: Grayscale, and color flow Doppler was performed of the right   and left kidneys.    The right kidney measures 11.05 x 4.34 x 5.89 cmand the left 10.51 x   4.89 x 5.50 cm.    There is no evidence of hydronephrosis, solid or cystic masses, nor   calculi. The urinary bladder is decompressed.  IMPRESSION:      1. Unremarkable bilateral renal ultrasound.     2. A preliminary faxed report was relayed to the patient's floor.      Thank you for the opportunity to contribute to the care of your patient.         Verified By: Mikki Santee, M.D., MD  LabUnknown:    06-Feb-14 14:17, US Soft Tissue Head/Neck/Thyroid  PACS Image    07-Feb-14 12:22, Chest Portable Single View  PACS Image    14-Apr-14 13:29, [place] Color Flow Doppler Low Extrem Left (Leg)  PACS  Image    22-Jul-14 17:56, CT Chest With Contrast  PACS Image    23-Jul-14 13:20, Chest PA and Lateral  PACS Image    25-Jul-14 17:19, CT Chest for Pulm Embolism With Contrast  PACS Image    27-Jul-14 16:47, CT Head Without Contrast  PACS Image    27-Jul-14 16:53, Chest Portable Single View  PACS Image    27-Jul-14 17:17, CT Chest Abdomen and Pelvis WO  PACS Image    13-Aug-14 19:32, CT Head Without Contrast  PACS Image    13-Aug-14 19:44, Chest Portable Single View  PACS Image    14-Aug-14 01:10, US Kidney Bilateral  PACS Image  CT:    22-Jul-14 17:56, CT Chest With Contrast  CT Chest With Contrast  REASON FOR EXAM:    R/O Disection  COMMENTS:       PROCEDURE: CT  - CT CHEST WITH CONTRAST  - Jan 11 2013  5:56PM     RESULT: History: Chest pain.    Comparison Study: Chest x-ray of 07/30/2012.    Findings: CT obtained 100 cc of Isovue-370. Evaluationin 3 dimensions on   separate workstation performed. Nodular densities are noted thyroid.   These are too small to characterize. Large airways are patent. The   mediastinum and hilar structures are normal. Thoracic aorta unremarkable.   No dissectionor aneurysm. Pulmonary arteries are normal. Cardiomegaly.   Diffuse pulmonary interstitial prominence noted. Mild CHF cannot be     excluded. Chronic interstitial lung disease or pneumonitis could also be   present. No pleural effusion. Bullous COPD.Fatty infiltration of the   liver. Cholecystectomy. Adrenals normal. No acute osseous abnormality.    IMPRESSION:  Cannot exclude mild CHF. Interstitial pneumonitis   interstitial fibrosis may also be present. COPD.    Thoracic aorta unremarkable.        Verified By: Osa Craver, M.D., MD    25-Jul-14 17:19, CT Chest for  Pulm Embolism With Contrast  CT Chest for Pulm Embolism With Contrast  REASON FOR EXAM:    dyspnea, chest pain  COMMENTS:       PROCEDURE: CT  - CT CHEST (FOR PE) W  - Jan 14 2013  5:19PM     RESULT:  History: Shortness of breath.    Comparison Study: Prior CT of 01/11/2013.     Findings colon: Standard CT obtained with 100 cc of Isovue-370. Thoracic   aorta normal. Subtle pulmonary embolus in a  subsegmental branches in the   right lower lobe cannot be excluded. Heart size is normal. Large airways   are patent. Bilateral interstitial changes are again noted. These have   improved slightly from prior exam. There is cardiomegaly. Component of   congestive failure may be present. A component of pneumonitis cannot be     excluded. Report phoned to patient's physician at time of study.    IMPRESSION:    1. Cannot exclude tiny pulmonary embolus in a small segmental  branch   right lower lobe.  2. Persistent mild interstitial changes suggesting pneumonitis or CHF.   These changes have improved slightly from prior study 01/11/2013.        Verified By: Osa Craver, M.D., MD    27-Jul-14 16:47, CT Head Without Contrast  CT Head Without Contrast  REASON FOR EXAM:    PEA Arrest, Respiratory failure, obtunded  COMMENTS:       PROCEDURE: CT  - CT HEAD WITHOUT CONTRAST  - Jan 16 2013  4:47PM     RESULT: Head CT dated 01/16/2013    Technique: Helical noncontrasted 5 mm sections were obtained from the   skull base to the vertex.    Findings: Subtle area of high attenuation projects within the right   frontal lobe and to a lesser extent the left frontal lobe. This appears   to course along the gyri. These findings are concerning for a subtle   subarachnoid hemorrhage. This finding is concern of an aneurysm rupture.   Neurosurgical consultation and/or neurovascular interventional     consultation recommended.    There is no further evidence of intra-axial nor extra-axial fluid   collections. There is no evidence of subfalcine or tonsillar herniation.   The osseous structures demonstrate no evidence of fracture or   dislocation. The visualized paranasal sinuses and mastoid air cells  are   patent.    IMPRESSION:  Findings concerning for subtle subarachnoid hemorrhage   project anteriorly within the right left frontal lobe regions. These   findings best appreciated on images 14 through 39.  2. Dr. Stevenson Clinch of the hospitalist service was informed of these findings   the via a call report.      Verified By: Mikki Santee, M.D., MD    27-Jul-14 17:17, CT Chest Abdomen and Pelvis WO  CT Chest Abdomen and Pelvis WO  REASON FOR EXAM:    PEA Arrest  COMMENTS:       PROCEDURE: CT  - CT CHEST ABDOMEN AND PELVIS WO  - Jan 16 2013  5:17PM     RESULT: CT of abdomen pelvis is performed without contrast and compared   to previous to CTs of the chest with contrast dated 7/22 and 01/14/2013.   Images are reconstructed at 3.0 mm slice thickness in the axial plane.   Lung window images show respiratory motion artifact. There is hazy   increased density in both lower lobes and portions of the  left upper lobe   the most prominent area hazy interstitial increased density in crowding   the lung markings minimal right upper lobe. There is an endotracheal tube   present with the tip in the right mainstem bronchus. This should be   withdrawn at least 2 to 3 cm nasogastric tube is present in the esophagus   extending into the stomach. There is no significant pleural fluid     collection. The aorta and heart are difficult to evaluate without benefit   of contrast and respiratory motion artifact. There is no pneumothorax.    There is evidence of fatty infiltration of the liver. Spleen is   unremarkable. The pancreas is within normal limits. The adrenal glands   are unremarkable. The kidney showed no hydronephrosis or discrete   exophytic mass or definite cyst. There is no evidence of abnormal bowel   distention. Foley catheters present in the nondistended urinary bladder.   There is no ascites or abnormal fluid collection. Scattered colonic   diverticulosis is present distally without  evidence of acute   diverticulitis. Cholecystectomy clips are present. The abdominal aorta   appears to be normal in caliber without aneurysm. There is scattered   atherosclerotic calcification. The bony structures of the spine appear   unremarkable. There is some vacuum disc phenomenon at the L4-L5 and L3-L4   levels. There is degenerative disc narrowing within the mid thoracic     region. The ribs appear to be intact. The pelvis appears unremarkable.   The main pulmonary artery and main left and main right pulmonary artery   appear to be prominent. Correlate for pulmonary hypertension.    IMPRESSION:   1. Worsening interstitial lung disease especially in the right upper lobe   with atelectasis versus developing pneumonia in the left lower lobe. Some   hazy ground glass attenuation is seen in the posterior left upper lobe   which could be atelectasis. Similar densities seen in the right lower   lobe.   2. The endotracheal tube tip is in the right mainstem bronchus. This   should be withdrawn at least 3 cm. Minimal density in the proximal left   mainstem bronchus may represent mucous. Nasogastric tube appears in good   position.  3. No acute bony abnormality.   Dictation Site: 1        Verified By: Sundra Aland, M.D., MD    13-Aug-14 19:32, CT Head Without Contrast  CT Head Without Contrast  REASON FOR EXAM:    altered mental status  COMMENTS:       PROCEDURE: CT  - CT HEAD WITHOUT CONTRAST  - Feb 02 2013  7:32PM     RESULT: History: Altered mental status.    Comparison Study: CT 01/16/2013.    Findings: No mass. No hydrocephalus. No hemorrhage. No acute bony   abnormality identified.    IMPRESSION:  No acute abnormality.      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

## 2014-10-13 NOTE — H&P (Signed)
PATIENT NAME:  Debra Saunders, Debra Saunders MR#:  [id number] DATE OF BIRTH:  1945-09-14  DATE OF ADMISSION:  02/02/2013  PRIMARY CARE PHYSICIAN: Dr. Ronette Deter.   REFERRING PHYSICIAN: Dr. Corky Downs.   CHIEF COMPLAINT: Altered mental status.   HISTORY OF PRESENT ILLNESS: The patient is a 69 year old African American female who was just admitted to hospital on July 22nd with complaint of severe chest pain which was substernal in nature. Was transferred to [facility] on July 27th for subarachnoid hemorrhage after starting the patient on Xarelto in addition to the DVT prophylaxis with Lovenox subcutaneous. The patient was evaluated at [facility] and subsequently, she was transferred to rehab center approximately 1 week ago. According to the patient's 2 daughters, the patient has been not eating enough and drinking very, very little for the past 5 days. From the time she was admitted to rehab center, she has been not doing good and for the past 2 days she is confused. The patient is sent over to the ER for altered mental status. CAT scan of the head is negative, but the patient is lethargic, arousable and answers 1 or 2 questions. The patient's creatinine went up to 7.42 from 1.54 during July. Troponin is also elevated at 0.34. White count is elevated at 14.6, and hemoglobin is 8.3, hematocrit 27.6. The patient's 12-lead EKG has revealed bradycardia but no acute T wave changes. She was given 1 liter fluid bolus, and she is receiving another liter of fluid with no urine output at all. Two daughters are at bedside. The patient has received IV vancomycin for possible aspiration pneumonia in the ER. The ER physician has talked to on call nephrologist, Dr. Candiss Norse, regarding acute kidney failure and the need for dialysis. Dr. Candiss Norse has recommended aggressive hydration with IV fluids tonight.   PAST MEDICAL HISTORY: Congestive heart failure, obstructive sleep apnea on CPAP at bedtime, COPD lives on 2 liters of oxygen, diabetes  mellitus, hypertension, recent history of subarachnoid hemorrhage in July 2014.   PAST SURGICAL HISTORY: Status post hysterectomy, status post tumor removal from her back, status post cholecystectomy.   ALLERGIES: PENICILLIN.   PSYCHOSOCIAL HISTORY: Living in rehab center for the past 5 days after discharge from [facility]. No history of smoking. No alcohol usage.   FAMILY HISTORY: Mother with congestive heart failure.   REVIEW OF SYSTEMS: Unobtainable because of the patient's altered mentation.   HOME MEDICATIONS: List from rehab: [place] 10/325 one tablet p.o. q.6 hours, pantoprazole 40 mg once daily, nitroglycerin 0.4 mg sublingually as needed for chest pain every 5 minutes x3, metformin 500 mg 2 times a day, magnesium oxide 400 mg 2 times a day, Keppra 500 mg 1 tablet p.o. b.i.d., ibuprofen 200 mg 2 tablets p.o. q.6 hours, famotidine 20 mg 2 times a day, enalapril 20 mg 2 times a day, Colace 100 mg 1 capsule 2 times a day, Coreg 3.125 mg 2 times a day, amlodipine 5 mg once daily, Tylenol 325 mg 2 tablets p.o. q.6 hours as needed.    PHYSICAL EXAMINATION:  VITAL SIGNS: Pulse is fluctuating between 50 to 52, respirations 20 to 26, blood pressure is 98/51, pulse ox 100% on 3 liters of oxygen.  GENERAL APPEARANCE: Not in any acute distress. The patient is lethargic. Answered a few questions.  HEENT: Normocephalic, atraumatic. Pupils are sluggishly reacting to light and accommodation. No scleral icterus. No sinus tenderness.  NECK: Supple. No JVD. No thyromegaly.  LUNGS: Bronchial breath sounds. Positive crackles and rales.  CARDIAC: S1  and S2 normal. Regular rate and rhythm. Positive murmur.  GASTROINTESTINAL: Soft, obese. Bowel sounds are positive in all 4 quadrants. Nontender, nondistended. No hepatosplenomegaly.  NEUROLOGIC: Very lethargic but arousable. Answered a few questions, oriented x1. Could recognize her daughter as reported by the patient's daughter at bedside. Disoriented to  time and place. Because of the patient's lethargy, I could not elicit motor function. Sensory: The patient is responding to painful stimuli and to verbal commands. Reflexes are 2+.  EXTREMITIES: Trace edema is present. No cyanosis. No clubbing.  SKIN: Warm to touch. Decreased turgor. No rashes. No lesions.  MUSCULOSKELETAL: No joint effusion, tenderness, erythema.  PSYCHIATRIC: Could not be examined because of the patient's altered mentation.   LABS AND IMAGING STUDIES: Her pH is 7.32 on FiO2 of 28%, pCO2 is 41, pO2 is 73, base excess is -4.8, bicarb is 21.1. Urinalysis 2+ bilirubin, ketones are negative, nitrite and leuk esterase are negative. She is B positive and antibody negative. WBC 14.6, hemoglobin is 8.3, hematocrit 27.6, platelets 292. LFTs: Total protein is elevated. Albumin serum 2.3. Bili total is 1.4, alkaline phosphatase 229, AST 41, ALT 41. Glucose 161, BUN 91, creatinine is 7.4, potassium 5.4, chloride 89, CO2 23. GFR is 6. Anion gap 13. Calcium 9.2. CT head: No acute findings. A 12-lead EKG: Sinus bradycardia at 59 beats per minute. No hyperacute T waves, meets criteria for LVH.    ASSESSMENT AND PLAN: A 69 year old African American female recently admitted to the hospital for chest pain and got transferred to [facility] for subarachnoid hemorrhage. Discharged from [facility] and sent over to rehab center in the past 1 week and is sent over to the Emergency Room for altered mental status.  1. Altered mental status: Probably from acute renal insufficiency and from sepsis. Acute kidney injury can be from dehydration and acute tubular necrosis. Her creatinine is 7.4 which was close to normal in July at 1.5. Will keep her in the Critical Care Unit. Will provide her aggressive hydration with intravenous fluids as recommended by nephrology. Renal ultrasound will be obtained. Nephrology consult is placed to Dr. Candiss Norse who has recommended to hydrate the patient. Avoid nephrotoxins.  2. Sepsis:  Probably from aspiration pneumonia. Pancultures were ordered, and the patient is getting broad-spectrum antibiotics with Primaxin, Levaquin and vancomycin.  3. Hyperkalemia: One dose of Kayexalate was given in the Emergency Room.  4. Recent history of subarachnoid hemorrhage during July. Will avoid all kinds of blood thinners.  5. Obstructive sleep apnea: Will use CPAP at bedtime.  6. Diabetes mellitus: The patient will be on sliding scale.  7. Chronic obstructive pulmonary disease: At this time, the patient is not in acute exacerbation.  8. Hypertension: Blood pressure is okay but on the low normal side.   She is FULL CODE. Two daughters are power of attorney.   TOTAL CRITICAL CARE TIME SPENT: 60 minutes.   The diagnosis and plan of care was discussed in detail with the patient's two daughters at bedside. They verbalized understanding of the plan.  ____________________________ Nicholes Mango, MD ag:gb D: 02/03/2013 00:41:00 ET T: 02/03/2013 01:58:45 ET JOB#: [id number]  cc: Anderson Malta A. Gilford Rile, MD Nicholes Mango MD ELECTRONICALLY SIGNED 02/25/2013 2:16

## 2014-10-13 NOTE — Op Note (Signed)
PATIENT NAME:  Debra Saunders, Debra Saunders MR#:  [id number] DATE OF BIRTH:  May 28, 1946  DATE OF PROCEDURE:  02/04/2013  PREOPERATIVE DIAGNOSES: 1.  Lack of appropriate intravenous access.  2.  Cardiac arrest.  3.  Hypotension.  4.  Acute renal insufficiency.   POSTOPERATIVE DIAGNOSIS: 1.  Lack of appropriate intravenous access. 2.  Cardiac arrest. 3.  Hypotension. 4.  Acute renal insufficiency.   PROCEDURE PERFORMED: Insertion of left IJ triple lumen catheter with ultrasound guidance.   PROCEDURE PERFORMED BY:  Katha Cabal, M.D.    DESCRIPTION OF PROCEDURE: The patient is in the Intensive Care Unit. She is critically ill. She is positioned supine. The left neck is prepped and draped in a sterile fashion. Ultrasound is placed in a sterile sleeve. Jugular vein is identified. It is echolucent and compressible, indicating patency. Image is recorded for the permanent record. Under direct ultrasound visualization, the jugular vein is accessed with the microneedle, microwire followed by micro-sheath, J-wire followed by the dilator and then the triple-lumen catheter is advanced over the wire. All three lumens aspirate and flush easily. The catheter is secured to the skin of the neck with the 2-0 silk suture and a sterile dressing is applied. The patient tolerated the procedure well and there were no immediate complications.    ____________________________ Katha Cabal, MD ggs:nts D: 02/04/2013 19:01:32 ET T: 02/05/2013 02:54:30 ET JOB#: [id number]  cc: Katha Cabal, MD, <Dictator> Katha Cabal MD ELECTRONICALLY SIGNED 03/04/2013 9:11

## 2014-10-14 NOTE — Discharge Summary (Signed)
Dates of Admission and Diagnosis:  Date of Admission 18-Nov-2013   Date of Discharge 19-Nov-2013   Admitting Diagnosis chest pain.   Final Diagnosis Chest pain- likely muscular CHF- no acute worsening Htn Hypokalemia    Chief Complaint/History of Present Illness a 69 year old female with known history of diabetes, hypertension, congestive heart failure, recent subarachnoid hemorrhage in July 2014, who presents to ED today with complaints of chest pain, shortness of breath. Reports chest pain, has been going on for the last week, reports mainly as midsternal, nonradiating, sharp quality, worsened by deep inspiration and ambulation. 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 The patient had CT chest angio which was negative for PE. The patient had negative cardiac enzymes, no EKG changes. The patient was recently admitted in February of this year with similar complaints, with negative cardiac enzymes. The patient reports she recently saw Dr. Fletcher Anon at his office, but by then, she did not have any complaints of chest pain. The patient has significant tenderness to palpation.   Allergies:  Penicillin: Hives, Swelling  Heparin: Other  Routine Chem:  28-May-15 23:01   Glucose, Serum  159  BUN 15  Creatinine (comp) 0.66  Sodium, Serum 136  Potassium, Serum  3.3  Chloride, Serum 101  CO2, Serum 29  Calcium (Total), Serum 9.1  Anion Gap  6  Osmolality (calc) 276  eGFR (African American) >60  eGFR (Non-African American) >60 (eGFR values <61m/min/1.73 m2 may be an indication of chronic kidney disease (CKD). Calculated eGFR is useful in patients with stable renal function. The eGFR calculation will not be reliable in acutely ill patients when serum creatinine is changing rapidly. It is not useful in  patients on dialysis. The eGFR calculation may not be applicable to patients at the  low and high extremes of body sizes, pregnant women, and vegetarians.)  Cardiac:  28-May-15 19:26   Troponin I - (0.00-0.05 0.05 ng/mL or less: NEGATIVE  Repeat testing in 3-6 hrs  if clinically indicated. >0.05 ng/mL: POTENTIAL  MYOCARDIAL INJURY. Repeat  testing in 3-6 hrs if  clinically indicated. NOTE: An increase or decrease  of 30% or more on serial  testing suggests a  clinically important change)    23:01   Troponin I < 0.02 (0.00-0.05 0.05 ng/mL or less: NEGATIVE  Repeat testing in 3-6 hrs  if clinically indicated. >0.05 ng/mL: POTENTIAL  MYOCARDIAL INJURY. Repeat  testing in 3-6 hrs if  clinically indicated. NOTE: An increase or decrease  of 30% or more on serial  testing suggests a  clinically important change)  29-May-15 01:15   Troponin I < 0.02 (0.00-0.05 0.05 ng/mL or less: NEGATIVE  Repeat testing in 3-6 hrs  if clinically indicated. >0.05 ng/mL: POTENTIAL  MYOCARDIAL INJURY. Repeat  testing in 3-6 hrs if  clinically indicated. NOTE: An increase or decrease  of 30% or more on serial  testing suggests a  clinically important change)    17:38   Troponin I < 0.02 (0.00-0.05 0.05 ng/mL or less: NEGATIVE  Repeat testing in 3-6 hrs  if clinically indicated. >0.05 ng/mL: POTENTIAL  MYOCARDIAL INJURY. Repeat  testing in 3-6 hrs if  clinically indicated. NOTE: An increase or decrease  of 30% or more on serial  testing suggests a  clinically important change)    20:47   Troponin I < 0.02 (0.00-0.05 0.05 ng/mL or less: NEGATIVE  Repeat testing in  3-6 hrs  if clinically indicated. >0.05 ng/mL: POTENTIAL  MYOCARDIAL INJURY. Repeat  testing in 3-6 hrs if  clinically indicated. NOTE: An increase or decrease  of 30% or more on serial  testing suggests a  clinically important change)   PERTINENT RADIOLOGY STUDIES: LabUnknown:    29-May-15 03:06, CT ANGIOGRAHPY Chest with for PE  PACS Image    Pertinent Past History:  Pertinent Past History 1.  Congestive heart failure.  2. Obstructive sleep apnea, on CPAP.  3. COPD, on 2 liters oxygen.  4. Diabetes mellitus. 5. Hypertension.  6. History of subarachnoid hemorrhage in July 2014.   Hospital Course:  Hospital Course * Chest pain- atypical- likley muscular.   Seen by cardiologist- troponin negative.    no cardiac work ups suggested.  * Congestive heart failure.no signs of volume overload.  Given IV contrast- so was on Gentle IV fluids. *. History of intracerebral bleed.  continue her with Keppra for seizure prophylaxis.  * Hypertension. Blood pressure was mildly elevated. This is most likely due to her uncontrolled musculoskeletal pain.  on amlodipine,  remained stable. * hypokalemia- replaced.   Condition on Discharge Stable   Code Status:  Code Status Full Code   DISCHARGE INSTRUCTIONS HOME MEDS:  Medication Reconciliation: Patient's Home Medications at Discharge:     Medication Instructions  polyethylene glycol 3350 oral powder for reconstitution  17 gram(s) orally once a day, As needed, constipation   amlodipine 5 mg oral tablet  1 tab(s) orally once a day   potassium chloride 20 meq oral tablet, extended release  1 tab(s) orally once a day   promethazine 25 mg oral tablet  1 tab(s) orally every 4 hours, As Needed   keppra 500 mg oral tablet  1 tab(s) orally 2 times a day   aluminum hydroxide/magnesium hydroxide/simethicone 400 mg-400 mg-40 mg/5 ml oral suspension  30 milliliter(s) orally every 6 hours, As Needed, dyspepsia , As needed, dyspepsia   cyclobenzaprine 10 mg oral tablet  1 tab(s) orally 3 times a day, As Needed muscle pain/spasm   tylenol 325 mg oral tablet  1 tab(s) orally every 6 hours   hygroton  12.5 milligram(s) orally once a day   furosemide 20 mg oral tablet  1 tab(s) orally 2 times a day   meclizine  50 milligram(s) orally 2 times a day   acetaminophen-oxycodone 325 mg-5 mg oral tablet  1 tab(s) orally every 6 hours     Physician's  Instructions:  Home Oxygen? Yes   Oxygen delivery at home: 2L  Nasal Cannula   Diet Low Sodium  Low Fat, Low Cholesterol   Activity Limitations As tolerated   Return to Work Not Applicable   Time frame for Follow Up Appointment 1-2 weeks   Other Comments folow with PMD in 2 weeks.     Gilford Rile, Jennifer(Family Physician): [employer], [address], [place], [place] [address], [place] [phone]  Electronic Signatures: Vaughan Basta (MD)  (Signed 02-Jun-15 07:45)  Authored: ADMISSION DATE AND DIAGNOSIS, CHIEF COMPLAINT/HPI, Allergies, PERTINENT LABS, PERTINENT RADIOLOGY STUDIES, PERTINENT PAST HISTORY, HOSPITAL COURSE, [place], PATIENT INSTRUCTIONS, Follow Up Physician   Last Updated: 02-Jun-15 07:45 by Vaughan Basta (MD)

## 2014-10-14 NOTE — Consult Note (Signed)
General Aspect 69 -year-old female with history of chronic diastolic heart failure with pulmonary hypertension and bradycardia while on carvedilol, chronic left lower extremity swelling secondary to lymphedema, moderate pulmonary hypertension with peak systolic pressure of 56 mm Hg in 2013 by echo, cardiac catheterization in August of 2013 which showed no evidence of obstructive coronary artery disease, HTN, chronic SOB, subarachnoid hemorrhage in July 2014, presenting with chest pain adn shortness of breath..   shortness of breath has been gradually getting worse for the last few days.  The patient states she has had chest pain on the left side and center of the chest with the shortness of breath.     Work-up in the Emergency Department, EKG, cardiac enzymes were unremarkable.  BNP is 57.  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  The patient is status post IVC filter.   Present Illness She was hospitalized in July 2014 at [facility] for severe chest pain. Cardiac workup was overall negative. CT of the chest showed possible peripheral pulmonary emboli which really did not explain her symptoms. She was anticoagulated which was complicated by subarachnoid hemorrhage and cardiopulmonary arrest. She was transferred to [facility] and was treated conservatively. Anticoagulation was discontinued. Chest pain was felt to be due to esophagitis.    readmitted in August at [facility] with septic shock and acute renal failure requiring brief dialysis. She ultimately improved. Renal function went back to normal.  SOCIAL HISTORY:  No history of smoking, drinking alcohol or using illicit drugs, lives by herself.   FAMILY HISTORY:  Mother with congestive heart failure.   Physical Exam:  GEN well developed, well nourished, obese   HEENT hearing intact to voice, moist oral  mucosa   NECK supple   RESP normal resp effort  clear BS   CARD Regular rate and rhythm  No murmur   ABD denies tenderness  soft   EXTR positive edema, of the left LE, no edema of the right   SKIN normal to palpation   NEURO motor/sensory function intact   PSYCH alert, A+O to time, place, person, good insight   Review of Systems:  Subjective/Chief Complaint SOB, chest pain on palpation   General: Weakness   Skin: No Complaints   ENT: No Complaints   Eyes: No Complaints   Neck: No Complaints   Respiratory: Short of breath   Cardiovascular: Chest pain or discomfort  tender to palpation   Gastrointestinal: No Complaints   Genitourinary: No Complaints   Vascular: No Complaints   Musculoskeletal: No Complaints   Neurologic: No Complaints   Hematologic: No Complaints   Endocrine: No Complaints   Psychiatric: No Complaints   Review of Systems: All other systems were reviewed and found to be negative   Medications/Allergies Reviewed Medications/Allergies reviewed     Subarachnoid Hemorrhage:    Diabetes:    DVT:    sleep apena:    chronic back pain:    chf:    htn:    Filter:    tumor removed frome back:    gall bladder removed:    hysterectomy:        Admit Diagnosis:   CHEST PAIN SOB: Onset Date: 17-Aug-2013, Status: Active, Description: CHEST PAIN SOB  Home Medications: Medication Instructions Status  Keppra 500 mg oral tablet 1 tab(s) orally 2 times a day Active  Dilaudid 2 mg  oral tablet 1 tab(s) orally every 4 to 6 hours, As Needed Active  polyethylene glycol 3350 oral powder for reconstitution 17 gram(s) orally once a day, As needed, constipation Active  amLODIPine 5 mg oral tablet 1 tab(s) orally once a day Active  Lasix 20 mg oral tablet 1 tab(s) orally once a day Active  potassium chloride 20 mEq oral tablet, extended release 1 tab(s) orally once a day Active  promethazine 25 mg oral tablet 1 tab(s) orally every 4 hours, As  Needed Active   Lab Results:  Thyroid:  25-Feb-15 01:52   Thyroid Stimulating Hormone 1.22 (0.45-4.50 (International Unit)  ----------------------- Pregnant patients have  different reference  ranges for TSH:  - - - - - - - - - -  Pregnant, first trimetser:  0.36 - 2.50 uIU/mL)  Routine Chem:  25-Feb-15 01:52   Glucose, Serum  131  BUN 16  Creatinine (comp) 0.76  Sodium, Serum 138  Potassium, Serum  3.1  Chloride, Serum 100  CO2, Serum 32  Calcium (Total), Serum 8.7  Anion Gap  6  Osmolality (calc) 279  eGFR (African American) >60  eGFR (Non-African American) >60 (eGFR values <22m/min/1.73 m2 may be an indication of chronic kidney disease (CKD). Calculated eGFR is useful in patients with stable renal function. The eGFR calculation will not be reliable in acutely ill patients when serum creatinine is changing rapidly. It is not useful in  patients on dialysis. The eGFR calculation may not be applicable to patients at the low and high extremes of body sizes, pregnant women, and vegetarians.)  Cardiac:  24-Feb-15 14:24   Troponin I < 0.02 (0.00-0.05 0.05 ng/mL or less: NEGATIVE  Repeat testing in 3-6 hrs  if clinically indicated. >0.05 ng/mL: POTENTIAL  MYOCARDIAL INJURY. Repeat  testing in 3-6 hrs if  clinically indicated. NOTE: An increase or decrease  of 30% or more on serial  testing suggests a  clinically important change)    21:14   Troponin I < 0.02 (0.00-0.05 0.05 ng/mL or less: NEGATIVE  Repeat testing in 3-6 hrs  if clinically indicated. >0.05 ng/mL: POTENTIAL  MYOCARDIAL INJURY. Repeat  testing in 3-6 hrs if  clinically indicated. NOTE: An increase or decrease  of 30% or more on serial  testing suggests a  clinically important change)  25-Feb-15 01:52   Troponin I < 0.02 (0.00-0.05 0.05 ng/mL or less: NEGATIVE  Repeat testing in 3-6 hrs  if clinically indicated. >0.05 ng/mL: POTENTIAL  MYOCARDIAL INJURY. Repeat  testing in 3-6 hrs if   clinically indicated. NOTE: An increase or decrease  of 30% or more on serial  testing suggests a  clinically important change)  CPK-MB, Serum 0.5 (Result(s) reported on 17 Aug 2013 at 02:50AM.)  Routine Hem:  25-Feb-15 01:52   WBC (CBC) 5.1  RBC (CBC) 4.14  Hemoglobin (CBC)  11.2  Hematocrit (CBC)  34.8  Platelet Count (CBC) 198  MCV 84  MCH 26.9  MCHC 32.1  RDW 13.5  Neutrophil % 52.6  Lymphocyte % 31.7  Monocyte % 10.1  Eosinophil % 4.3  Basophil % 1.3  Neutrophil # 2.7  Lymphocyte # 1.6  Monocyte # 0.5  Eosinophil # 0.2  Basophil # 0.1 (Result(s) reported on 17 Aug 2013 at 02:19AM.)   EKG:  Interpretation EKG shows NSR with  left ventricular hypertrophy with nonspecific ST changes.   Radiology Results: XRay:    24-Feb-15 14:15, Chest PA and Lateral  Chest PA and Lateral   REASON FOR EXAM:  Shortness of Breath  COMMENTS:   May transport without cardiac monitor    PROCEDURE: DXR - DXR CHEST PA (OR AP) AND LATERAL  - Aug 16 2013  2:15PM     CLINICAL DATA:  Dyspnea and CHF symptoms with chest pain and  bradycardia    EXAM:  CHEST  2 VIEW    COMPARISON:  DG CHEST 1V PORT dated 02/14/2013    FINDINGS:  The right hemidiaphragm remains elevated. The pulmonary interstitial  markings are increased bilaterally and this is not new. The  cardiopericardial silhouette is enlarged. The pulmonary vascularity  is engorged and there is cephalization of the vascular pattern.  There is mild tortuosity of the descending thoracic aorta. No  pleural effusion is demonstrated. There is degenerative disc change  at multiple mid and lower thoracic levels.     IMPRESSION:  The findings are consistent with congestive heart failure with  pulmonary interstitial edema. There is no significant pleural  effusion and there is no evidence of alveolar pneumonia.      Electronically Signed    By: David  Martinique    On: 08/16/2013 14:22         Verified By: DAVID A. Martinique, M.D., MD   [place]:    24-Feb-15 17:25, [place] Color Flow Doppler Low Extrem Left (Leg)  [place] Color Flow Doppler Low Extrem Left (Leg)   REASON FOR EXAM:    Swelling  COMMENTS:       PROCEDURE: [place]  - US DOPPLER LOW EXTR LEFT  - Aug 16 2013  5:25PM     CLINICAL DATA:  Left lower extremity swelling.    EXAM:  Left LOWER EXTREMITY VENOUS DOPPLER ULTRASOUND    TECHNIQUE:  Gray-scale sonography with graded compression, as well as color  Doppler and duplex ultrasound, were performed to evaluate the deep  venous system from the level of the common femoral vein through the  popliteal and proximal calf veins. Spectral Doppler was utilized to  evaluate flow at rest and with distal augmentation maneuvers.    COMPARISON:  10/04/2012    FINDINGS:  Thrombus within deep veins: The patient has some linear bands of  increased echogenicity within the lumen of the common femoral vein  and proximal superficial femoral vein. Similar linear intraluminal  material is seen in the upper aspect of the left profunda femoral  vein as well.    Compressibility of deep veins: The common femoral vein, proximal  superficial femoral vein, and the upper aspect of the profunda  femoral vein all show at least partial compressibility.  Duplex waveform respiratory phasicity: Color flow signal is  identified in the deep venous anatomy and phasicity.    Duplex waveform response to augmentation:  Not assessed.    Venous reflux:  Not applicable    Other findings: There is edema identified within the superficial  soft tissues of the leg.     IMPRESSION:  Imaging features compatible with nonocclusive thrombus within the  deep venous system of the left thigh. Given the linear, "stringy"  appearance of the thrombus, this is likely chronic in etiology. This  would fit with the patient's reported previous clinical history to  the sonographer of DVT and IVC filter placement last year.    I discussed these findings with Dr. Harlow Asa  in the emergency  room at 1818 hr on 08/16/2012.      Electronically Signed    By: Misty Stanley M.D.    On: 08/16/2013 18:21  Verified By: ERIC A. MANSELL, M.D.,    Penicillin: Hives, Swelling  Vital Signs/Nurse's Notes: **Vital Signs.:   25-Feb-15 12:00  Vital Signs Type Routine  Temperature Temperature (F) 97.4  Celsius 36.3  Temperature Source oral  Pulse Pulse 52  Respirations Respirations 18  Systolic BP Systolic BP 103  Diastolic BP (mmHg) Diastolic BP (mmHg) 60  Mean BP 80  Pulse Ox % Pulse Ox % 97  Pulse Ox Activity Level  At rest  Oxygen Delivery 3L    Impression 56 -year-old female with history of chronic diastolic heart failure with pulmonary hypertension and bradycardia while on carvedilol, chronic left lower extremity swelling secondary to lymphedema, moderate pulmonary hypertension with peak systolic pressure of 56 mm Hg in 2013 by echo, cardiac catheterization in August of 2013 which showed no evidence of obstructive coronary artery disease, HTN, chronic SOB, subarachnoid hemorrhage in July 2014, presenting with chest pain adn shortness of breath..   Well known to [facility]  1)atypical chest pain cardiac enz negative, prior cath with no significant CAD tender to palpation --treat with pain meds (vicodin), low dose NSAIDS, flexeril --Heat packs  2) Pulomonary HTN: moderate  SOB in the past few days, suggests worsening diastolic CHF Lasix yesterday with low potassium today --WOuld give additional lasix 40 IV this AM with potassium 30 meq x 2 over 4 hours -Consider D/C later today Follow up in clinic  3) HTN: continue outpt meds  4) DVT, LE on u/s nonocclusive IVC filter in place hx of bleed, would not start anticoagulation --Recommend TEDS   Electronic Signatures: Ida Rogue (MD)  (Signed 25-Feb-15 13:16)  Authored: General Aspect/Present Illness, History and Physical Exam, Review of System, Past Medical History, Health  Issues, Home Medications, Labs, EKG , Radiology, Allergies, Vital Signs/Nurse's Notes, Impression/Plan   Last Updated: 25-Feb-15 13:16 by Ida Rogue (MD)

## 2014-10-14 NOTE — Discharge Summary (Signed)
PATIENT NAME:  Debra Saunders, Debra Saunders MR#:  [id number] DATE OF BIRTH:  Jan 11, 1946  DATE OF ADMISSION:  08/16/2013 DATE OF DISCHARGE:  08/17/2013  DISCHARGE DIAGNOSES:  1. Atypical chest pain with negative cardiac enzymes, negative prior catheterization. This was likely muscular in nature with tenderness to palpation.  2. Moderate pulmonary hypertension, requiring outpatient followup with cardiology.  3. Deep venous thrombosis, not a candidate for anticoagulation due to history of bleed, has an inferior vena cava filter in place.  4. Mild acute on chronic diastolic heart failure, well compensated now.  5. Hypokalemia, repleted and resolved.   SECONDARY DIAGNOSES:  1. Congestive heart failure.  2. Obstructive sleep apnea.  3. Chronic obstructive pulmonary disease, on 2 liters oxygen.  4. Diabetes mellitus.  5. Hypertension. 6. History of subarachnoid hemorrhage.   CONSULTATION: Cardiology, Dr. Ida Rogue.   PROCEDURES AND RADIOLOGY:  1. Chest x-ray on the 24th of February showed findings consistent with CHF with pulmonary interstitial edema.  2. CT scan of the chest on the 24th of February showed no evidence of PE. Possible underlying chronic atypical infection or interstitial lung disease.  3. Left lower extremity Doppler on the 24th of February showed nonocclusive thrombus within the deep venous system of the left thigh. This is likely chronic.   HISTORY AND SHORT HOSPITAL COURSE: The patient is a 69 year old female with the above-mentioned medical problems, who was admitted for chest pain and shortness of breath. Please see Dr. Ward Givens dictated history and physical for further details. Cardiology consultation was obtained with Dr. Rockey Situ, who recommended 1 dose of IV Lasix and potassium replacement as she was showing mild shortness of breath. She was feeling much better. Her chest pain was ruled out with negative serial troponins and was thought to be muscular in nature as it was tender  on palpation. She was feeling somewhat better later on the 25th of February with help of nonsteroidal anti-inflammatories, and after discussion with cardiology, the patient was discharged home in stable condition.   PERTINENT PHYSICAL EXAMINATION ON THE DATE OF DISCHARGE:  VITAL SIGNS: As follows: Temperature 97.4, heart rate 52 per minute, respirations 18 per minute, blood pressure 120/60 mmHg. She was saturating 95% on 2 liters oxygen via nasal cannula.  CARDIOVASCULAR: S1, S2 normal. No murmurs, rubs or gallop.  LUNGS: Clear to auscultation bilaterally. No wheezing, rales, rhonchi or crepitation. Chest wall was tender to palpation.  ABDOMEN: Soft, benign.  NEUROLOGIC: Nonfocal examination.  All other physical examination remained at baseline.   DISCHARGE MEDICATIONS:  1. MiraLax once daily.  2. Amlodipine 5 mg p.o. daily.  3. Dilaudid 2 mg p.o. every 4 to 6 hours as needed.  4. Lasix 20 mg p.o. daily.  5. Potassium chloride 20 mEq p.o. daily.  6. Promethazine 25 mg p.o. every 4 hours as needed.  7. Keppra 500 mg p.o. b.i.d.  8. Mylanta 30 mL p.o. every 6 hours as needed.  9. Enteric-coated Naprosyn 500 mg p.o. b.i.d. as needed.  10. Cyclobenzaprine 10 mg p.o. 3 times a day as needed.   DISCHARGE DIET: Low sodium, low fat, low cholesterol.   DISCHARGE ACTIVITY: As tolerated.   DISCHARGE INSTRUCTIONS AND FOLLOWUP:  The patient was instructed to follow up with her primary care physician, Dr. Ronette Deter, in 2 to 4 weeks. She will need followup with cardiology, Dr. Rockey Situ, in 1 to 2 weeks.   TOTAL TIME DISCHARGING THIS PATIENT: 45 minutes.     ____________________________ Lucina Mellow. Manuella Ghazi, MD vss:lb D: 08/22/2013  11:23:30 ET T: 08/23/2013 05:48:32 ET JOB#: [id number]  cc: Nereida Schepp S. Manuella Ghazi, MD, <Dictator> Eduard Clos. Gilford Rile, MD Minna Merritts, MD Lucina Mellow [facility] MD ELECTRONICALLY SIGNED 08/24/2013 21:24

## 2014-10-14 NOTE — Consult Note (Signed)
General Aspect This is a 69 -year-old female who is well known to me. She presented with chest pain.  She has known history of chronic diastolic heart failure with pulmonary hypertension and bradycardia . She has chronic left lower extremity swelling with a nonocclusive DVT on ultrasound last year with subsequent IVC filter placement. She likely has lymphedema on the left side as well. Her echocardiogram in 2013 showed normal LV systolic function with mild diastolic dysfunction and evidence of elevated filling pressures. There was also moderate pulmonary hypertension with peak systolic pressure of 56 mm Hg.  Cardiac catheterization in August of 2013 which  no evidence of obstructive coronary artery disease.   She was hospitalized in July, 2014 at [facility] for severe chest pain. Cardiac workup was overall negative. CT of the chest showed possible peripheral pulmonary emboli which really did not explain her symptoms. She was anticoagulated which was complicated by subarachnoid hemorrhage and cardiopulmonary arrest. She was transferred to [facility] and was treated conservatively. Anticoagulation was discontinued. Chest pain was felt to be due to esophagitis. She was discharged home but readmitted in August at [facility] with septic shock and acute renal failure requiring brief dialysis. She ultimately improved. Renal function went back to normal. she presented with right sided chest pain worse with movement and deep breath. no dyspnea.   Physical Exam:  GEN well developed, well nourished, obese   HEENT hearing intact to voice, moist oral mucosa   NECK supple   RESP normal resp effort  clear BS   CARD Regular rate and rhythm  No murmur   ABD denies tenderness  soft   EXTR positive edema, of the left LE, no edema of the right   SKIN normal to palpation   NEURO motor/sensory function intact   PSYCH alert, A+O to time, place, person, good insight   Review of Systems:  Subjective/Chief Complaint chest pain  on palpation   General: Weakness   Skin: No Complaints   ENT: No Complaints   Eyes: No Complaints   Neck: No Complaints   Respiratory: Short of breath   Cardiovascular: Chest pain or discomfort  tender to palpation   Gastrointestinal: No Complaints   Genitourinary: No Complaints   Vascular: No Complaints   Musculoskeletal: No Complaints   Neurologic: No Complaints   Hematologic: No Complaints   Endocrine: No Complaints   Psychiatric: No Complaints   Review of Systems: All other systems were reviewed and found to be negative   Medications/Allergies Reviewed Medications/Allergies reviewed   Home Medications: Medication Instructions Status  cyclobenzaprine 10 mg oral tablet 1 tab(s) orally 3 times a day, As Needed muscle pain/spasm Active  aluminum hydroxide/magnesium hydroxide/simethicone 400 mg-400 mg-40 mg/5 mL oral suspension 30 milliliter(s) orally every 6 hours, As Needed, dyspepsia , As needed, dyspepsia Active  Keppra 500 mg oral tablet 1 tab(s) orally 2 times a day Active  polyethylene glycol 3350 oral powder for reconstitution 17 gram(s) orally once a day, As needed, constipation Active  amLODIPine 5 mg oral tablet 1 tab(s) orally once a day Active  potassium chloride 20 mEq oral tablet, extended release 1 tab(s) orally once a day Active  promethazine 25 mg oral tablet 1 tab(s) orally every 4 hours, As Needed Active  Tylenol 325 mg oral tablet 1 tab(s) orally every 6 hours Active  Hygroton 12.5 milligram(s) orally once a day Active  furosemide 20 mg oral tablet 1 tab(s) orally 2 times a day Active  meclizine 50 milligram(s) orally 2 times a  day Active  acetaminophen-oxyCODONE 325 mg-5 mg oral tablet 1 tab(s) orally every 6 hours Active   Lab Results: Routine Chem:  29-May-15 07:24   Glucose, Serum  248  BUN 16  Creatinine (comp) 0.73  Sodium, Serum 136  Potassium, Serum  3.2  Chloride, Serum  97  CO2, Serum 30  Calcium (Total), Serum 9.0  Anion Gap  9  Osmolality (calc) 281  eGFR (African American) >60  eGFR (Non-African American) >60 (eGFR values <12m/min/1.73 m2 may be an indication of chronic kidney disease (CKD). Calculated eGFR is useful in patients with stable renal function. The eGFR calculation will not be reliable in acutely ill patients when serum creatinine is changing rapidly. It is not useful in  patients on dialysis. The eGFR calculation may not be applicable to patients at the low and high extremes of body sizes, pregnant women, and vegetarians.)  Magnesium, Serum  1.7 (1.8-2.4 THERAPEUTIC RANGE: 4-7 mg/dL TOXIC: > 10 mg/dL  -----------------------)  Cardiac:  29-May-15 07:24   CPK-MB, Serum 0.7 (Result(s) reported on 18 Nov 2013 at 08:47AM.)  Routine Hem:  29-May-15 07:24   WBC (CBC) 6.0  RBC (CBC) 4.32  Hemoglobin (CBC)  11.5  Hematocrit (CBC) 36.8  Platelet Count (CBC) 196  MCV 85  MCH 26.7  MCHC  31.4  RDW 14.0  Neutrophil % 63.6  Lymphocyte % 25.0  Monocyte % 7.6  Eosinophil % 2.6  Basophil % 1.2  Neutrophil # 3.8  Lymphocyte # 1.5  Monocyte # 0.5  Eosinophil # 0.2  Basophil # 0.1 (Result(s) reported on 18 Nov 2013 at 08:10AM.)   EKG:  Interpretation EKG shows sinus bradycardia with  left ventricular hypertrophy with nonspecific ST changes.    Penicillin: Hives, Swelling  Vital Signs/Nurse's Notes: **Vital Signs.:   29-May-15 11:02  Vital Signs Type Routine  Temperature Temperature (F) 97.7  Celsius 36.5  Temperature Source oral  Pulse Pulse 60  Respirations Respirations 20  Systolic BP Systolic BP 1324 Diastolic BP (mmHg) Diastolic BP (mmHg) 77  Mean BP 95  Pulse Ox % Pulse Ox % 94  Pulse Ox Activity Level  At rest  Oxygen Delivery 3L    Impression 89-year-old female with history of chronic diastolic heart failure with pulmonary hypertension and bradycardia while on carvedilol, chronic left lower extremity swelling secondary to lymphedema, moderate pulmonary hypertension with  peak systolic pressure of 56 mm Hg in 2013 by echo, cardiac catheterization in August of 2013 which showed no evidence of obstructive coronary artery disease, HTN, chronic SOB, subarachnoid hemorrhage in July 2014, presenting with atypical chest pain .    1)atypical chest pain cardiac enz negative, prior cath with no significant CAD tender to palpation. Likely recurrent costochondritis.  --treat with pain meds (vicodin), low dose NSAIDS, flexeril --Heat packs.  No further work recommended. OK to discharge home tomorrow from a cardiac standpoint.   2) chronic diastolic heart failure with moderate pulomonary HTN: stable.  continue medical therapy.   3) HTN: continue outpt meds  4) Bradycardia: she is getting work up for pacemaker by Dr. KCaryl Comes   Electronic Signatures: AKathlyn Sacramento(MD)  (Signed 29-May-15 18:42)  Authored: General Aspect/Present Illness, History and Physical Exam, Review of System, Home Medications, Labs, EKG , Allergies, Vital Signs/Nurse's Notes, Impression/Plan   Last Updated: 29-May-15 18:42 by AKathlyn Sacramento(MD)

## 2014-10-14 NOTE — H&P (Signed)
PATIENT NAME:  Debra Saunders, Debra Saunders MR#:  [id number] DATE OF BIRTH:  [birth year]  DATE OF ADMISSION:  08/16/2013  PRIMARY CARE PHYSICIAN:  Dr. Ronette Saunders.   REFERRING PHYSICIAN:  Dr. Joni Saunders.   CHIEF COMPLAINT:  Chest pain, shortness of breath.   HISTORY OF PRESENT ILLNESS:  [name] is a 69 year old African American female with a past medical history of diabetes mellitus, hypertension, congestive heart failure, recent subarachnoid hemorrhage in July 2014, presented to the Emergency Department with shortness of breath, which has been gradually getting worse for the last few days.  The patient states has chest pain on the left side of the chest.  No radiation, sharp in nature.  This is associated with shortness of breath.  Also noticed to have increased pedal edema.  Work-up in the Emergency Department, EKG, cardiac enzymes were unremarkable.  BNP is 59.  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  The patient is status post IVC filter.   PAST MEDICAL HISTORY: 1.  Congestive heart failure.  2.  Obstructive sleep apnea on CPAP.  3.  COPD on 2 liters of oxygen.  4.  Diabetes mellitus.  5.  Hypertension.  6.  History of subarachnoid hemorrhage in July 2014.   PAST SURGICAL HISTORY:  1.  Hysterectomy.  2.  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  3.  Cholecystectomy.   ALLERGIES:  PENICILLIN.   HOME MEDICATIONS:   1.  Phenergan 25 mg every four hours as needed.  2.  Potassium 20 mEq once a day.  3.  MiraLAX 17 grams once a day.  4.  Lasix 20 mg once a day.  5.  Keppra 500 mg 2 times a day.  6.  Dilaudid 2 mg every 4 to 6 hours as needed.  7.  Amlodipine 5 mg once a day.   SOCIAL HISTORY:  No history of smoking, drinking alcohol or using illicit drugs, lives by herself.   FAMILY HISTORY:  Mother with congestive heart failure.   REVIEW OF  SYSTEMS:  CONSTITUTIONAL:  Denies any generalized weakness.  EYES:  No change in vision.  EARS, NOSE, THROAT:  No change in hearing.  RESPIRATORY:  Has shortness of breath.  CARDIOVASCULAR:  Has chest pain.  GASTROINTESTINAL:  No nausea, vomiting, abdominal pain.  GENITOURINARY:  No dysuria or hematuria.  ENDOCRINE:  No polyuria or polydipsia.  HEMATOLOGIC:  No easy bruising or bleeding.  SKIN:  No rash or lesions.  MUSCULOSKELETAL:  Has chronic pain.  NEUROLOGIC:  No weakness or numbness in any part of the body.   PHYSICAL EXAMINATION: GENERAL:  This is a well-built, well-nourished, age-appropriate female lying down in the bed, not in distress.  VITAL SIGNS:  Temperature 98, pulse 67, blood pressure 144/84, respiratory rate of 20, oxygen saturation 97% on room air.  HEENT:  Head normocephalic, atraumatic.  Eyes, no scleral icterus.  Conjunctivae normal.  Pupils equal and react to light.  Extraocular movements are intact.  Mucous membranes moist.  No pharyngeal erythema.  NECK:  Supple.  No lymphadenopathy.  No JVD.  No carotid bruit.  No thyromegaly.  CHEST:  Has focal tenderness on the left and the right parasternal area, tenderness.  LUNGS:  Bilaterally clear to auscultation.  HEART:  S1, S2, regular.  No murmurs are heard.  ABDOMEN:  Bowel sounds plus, soft, nontender, nondistended.  EXTREMITIES:  No pedal edema.  Pulses 2+. NEUROLOGIC:  The patient is alert, oriented to place, person and time.  Cranial nerves II through XII intact.  Motor 5 by 5 in upper and lower extremities.   LABORATORY DATA:  CT of the chest, negative for PE, central air space disease associated with bronchiectasis.   Ultrasound of the lower extremity, left, nonocclusive thrombus.  Cardiac enzymes are well within normal limits.  CBC is completely within normal limits.  BMP is completely within normal limits.   Chest x-ray, PA and lateral, consistent with congestive heart failure with pulmonary interstitial  edema.   ASSESSMENT AND PLAN:  [name] is a 69 year old female who comes to the Emergency Department with chest pain and shortness of breath.   1.  Chest pain.  This is a musculoskeletal pain, however considering the patient's risk factors, we will cycle cardiac enzymes x 3.  If negative, further work-up as needed.  2.  Congestive heart failure.  The patient has bibasilar crackles, however the BNP is 59.  We will continue with one dose of Lasix and follow up with the patient's symptoms.  Also, debility might be further causing her to have shortness of breath.  3.  Diabetes.  4.  History of intracerebral bleed.  Continue with Keppra.  5.  Hypertension, currently well-controlled.  Continue with Coreg and amlodipine.  6.  Keep the patient on deep vein thrombosis prophylaxis with Lovenox.   TIME SPENT:  45 minutes.     ____________________________ Debra Becton, MD pv:ea D: 08/17/2013 01:46:00 ET T: 08/17/2013 02:27:33 ET JOB#: [id number]  cc: Debra Becton, MD, <Dictator> Debra Clos. Gilford Rile, MD Debra Mitts Daelyn Pettaway MD ELECTRONICALLY SIGNED 08/28/2013 22:35

## 2014-10-14 NOTE — H&P (Signed)
PATIENT NAME:  Debra Saunders, Debra Saunders MR#:  [id number] DATE OF BIRTH:  Jun 26, 1945  DATE OF ADMISSION:  11/17/2013  REFERRING PHYSICIAN: Conni Slipper, MD   PRIMARY CARE PHYSICIAN: Eduard Clos. Gilford Rile, MD  PRIMARY CARDIOLOGIST: Mertie Clause. Fletcher Anon, MD   CHIEF COMPLAINT: Chest pain.   HISTORY OF PRESENT ILLNESS: This is a 69 year old female with known history of diabetes, hypertension, congestive heart failure, recent subarachnoid hemorrhage in July 2014, who presents to ED today with complaints of chest pain, shortness of breath. Reports chest pain, has been going on for the last week, reports mainly as midsternal, nonradiating, sharp quality, worsened by deep inspiration and ambulation. 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 The patient had CT chest angio which was negative for PE. The patient had negative cardiac enzymes, no EKG changes. The patient was recently admitted in February of this year with similar complaints, with negative cardiac enzymes. The patient reports she recently saw Dr. Fletcher Anon at his office, but by then, she did not have any complaints of chest pain. The patient has significant tenderness to palpation.   PAST MEDICAL HISTORY:  1. Congestive heart failure.  2. Obstructive sleep apnea, on CPAP.  3. COPD, on 2 liters oxygen.  4. Diabetes mellitus. 5. Hypertension.  6. History of subarachnoid hemorrhage in July 2014.   PAST SURGICAL HISTORY:  1. Hysterectomy.  2. 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  3. Cholecystectomy.   ALLERGIES: PENICILLIN.   HOME MEDICATIONS:  1. Phenergan as needed.  2. Potassium 20 mEq oral daily.  3. MiraLax as needed. 4. Lasix 20 mg daily.  5. Keppra 500 mg oral 2 times a day.  6. Dilaudid as needed.  7. Amlodipine 5 mg oral daily.   SOCIAL HISTORY: No history of smoking, alcohol or illicit drug use. Lives at home.   FAMILY HISTORY:  Significant for congestive heart failure in the mother.   REVIEW OF SYSTEMS:  CONSTITUTIONAL: Denies fever, chills, fatigue, weakness.  EYES: Denies blurry vision, double vision, inflammation.  ENT: Denies tinnitus, ear pain, hearing loss.  RESPIRATORY: Denies any cough, wheezing, hemoptysis. Reports shortness of breath and COPD.  CARDIOVASCULAR: Reports chest pain. Denies edema, arrhythmia, palpitations, syncope.  GASTROINTESTINAL: Denies nausea, vomiting, diarrhea, abdominal pain.  GENITOURINARY: Denies dysuria, hematuria, renal colic. ENDOCRINE: Denies polyuria, polydipsia, heat or cold intolerance.  HEMATOLOGY: Denies anemia, easy bruising, bleeding diathesis.  INTEGUMENT: Denies acne, rash or skin lesion.  MUSCULOSKELETAL: Reports history of arthritis, chronic lower back pain. Denies any gout.  NEUROLOGIC: Denies any history of CVA, seizures, memory loss, any focal deficits.   PHYSICAL EXAMINATION:  VITAL SIGNS: Temperature 98.1, pulse 55, respiratory rate 17, blood pressure 171/71, saturating 97% on oxygen.  GENERAL: Well-nourished female, looks comfortable, in no apparent distress.  HEENT: Head atraumatic, normocephalic. Pupils equally reactive to light. Pink conjunctivae. Anicteric sclerae. Moist oral mucosa.  NECK: Supple. No thyromegaly. No JVD.  CHEST: Good air entry bilaterally. No wheezing, rales, rhonchi.  CARDIOVASCULAR: S1, S2 heard. No rubs, murmur or gallops.  ABDOMEN: Soft, nontender, nondistended. Bowel sounds present.  EXTREMITIES: Left lower extremity has +2 edema, which is chronic secondary to her DVT. Right lower extremity: No edema. Pulses +2 bilaterally. No clubbing. No cyanosis. Pedal pulses felt bilaterally.  PSYCHIATRIC: Appropriate affect. Awake, alert x3. Intact judgment and insight.  NEUROLOGIC: Cranial nerves grossly intact. Motor strength 5 out of 5. No focal deficits.  MUSCULOSKELETAL: Has  tenderness to palpation of the back, around the neck area and in  the chest area as well.   PERTINENT LABORATORY DATA: Glucose 159, BUN 15, creatinine 0.66, sodium 136, potassium 3.3, chloride 101, CO2 29. Troponin less than 0.02 x2. White blood cells 6.2, hemoglobin 12.3, hematocrit 39.2, platelets 219. Urinalysis negative for leukocyte esterase and nitrite.   IMAGING: CT chest angiography, PE protocol, showing no evidence of PE, bilateral bronchovascular airspace opacity, mildly worsened from previous study, possibility of infection, with scattered blebs at the lung bases.   ASSESSMENT AND PLAN:  1. Chest pain. It appears to be musculoskeletal chest pain. So far, her cardiac enzymes are negative x2, has no significant EKG changes, but given her significant history, she will be admitted for further evaluation as this is the second time presenting with chest pain in 3 months. Will be admitted. Cycle a total of 3 sets of cardiac enzymes. Keep on p.r.n. pain medicine. Will consult cardiology service to see if there is any further workup indicated at this point. 2. Congestive heart failure. The patient does not appear to be in any volume overload. I will hold her Lasix. Given the fact she was given IV contrast, will have her on gentle hydration. Will monitor closely for any signs of volume overload.  3. History of intracerebral bleed. Will continue her with Keppra for seizure prophylaxis.  4. Hypertension. Blood pressure is mildly elevated. This is most likely due to her uncontrolled musculoskeletal pain. Would continue her on amlodipine, and if her blood pressure is uncontrolled after pain control, will start her on p.r.n. hydralazine.  5. Hypokalemia. Will replace.  6. Deep vein thrombosis prophylaxis. SCDs.   TOTAL TIME SPENT ON ADMISSION AND PATIENT CARE: 50 minutes.   CODE STATUS: Full code.     ____________________________ Albertine Patricia, MD dse:lb D: 11/18/2013 05:32:48 ET T: 11/18/2013 05:54:28 ET JOB#: [id number]  cc: Albertine Patricia, MD,  <Dictator> Jameel Quant Graciela Husbands MD ELECTRONICALLY SIGNED 11/18/2013 23:41

## 2014-10-16 ENCOUNTER — Telehealth: Payer: Self-pay

## 2014-10-16 NOTE — Telephone Encounter (Signed)
-----   Message from Juanito Doom, MD sent at 10/16/2014  1:40 PM EDT ----- A, Please let her know that her VQ scan was normal Thanks B

## 2014-10-16 NOTE — Telephone Encounter (Signed)
Pt aware of results.  Nothing further needed.  

## 2014-10-17 ENCOUNTER — Ambulatory Visit (INDEPENDENT_AMBULATORY_CARE_PROVIDER_SITE_OTHER): Payer: Commercial Managed Care - HMO | Admitting: *Deleted

## 2014-10-17 ENCOUNTER — Encounter: Payer: Self-pay | Admitting: Internal Medicine

## 2014-10-17 DIAGNOSIS — I495 Sick sinus syndrome: Secondary | ICD-10-CM

## 2014-10-17 NOTE — Progress Notes (Signed)
Remote pacemaker transmission.   

## 2014-10-18 LAB — MDC_IDC_ENUM_SESS_TYPE_REMOTE
Battery Remaining Longevity: 156 mo
Battery Remaining Percentage: 100 %
Brady Statistic RA Percent Paced: 41 %
Brady Statistic RV Percent Paced: 1 %
Date Time Interrogation Session: 20160426072200
Implantable Pulse Generator Serial Number: 390330
Lead Channel Impedance Value: 398 Ohm
Lead Channel Impedance Value: 662 Ohm
Lead Channel Pacing Threshold Amplitude: 0.6 V
Lead Channel Pacing Threshold Pulse Width: 0.4 ms
Lead Channel Setting Pacing Amplitude: 2 V
Lead Channel Setting Pacing Amplitude: 2.4 V
Lead Channel Setting Pacing Pulse Width: 0.4 ms
Lead Channel Setting Sensing Sensitivity: 2.5 mV
Zone Setting Detection Interval: 375 ms

## 2014-10-19 ENCOUNTER — Telehealth: Payer: Self-pay

## 2014-10-19 NOTE — Telephone Encounter (Signed)
The patient has an apt for a diabetic eye exam at [facility] on May 18th (Dr.Syndor) (yearly exam)   Pt callback - [phone]

## 2014-10-24 ENCOUNTER — Encounter: Payer: Self-pay | Admitting: Internal Medicine

## 2014-10-26 DIAGNOSIS — M189 Osteoarthritis of first carpometacarpal joint, unspecified: Secondary | ICD-10-CM | POA: Diagnosis not present

## 2014-11-02 ENCOUNTER — Other Ambulatory Visit: Payer: Self-pay | Admitting: Internal Medicine

## 2014-11-02 ENCOUNTER — Encounter: Payer: Self-pay | Admitting: Internal Medicine

## 2014-11-02 ENCOUNTER — Telehealth: Payer: Self-pay | Admitting: Pulmonary Disease

## 2014-11-02 NOTE — Telephone Encounter (Signed)
Per 10/11/14 referral note: General 10/11/2014 4:35 PM COBB, RHONDA J        Note   Spoke with Rodena Piety at [facility] and she states that pt is due for another cpap, however, pt has [employer] and will need to have new referral sent to [place]. However, the first step will need to have [facility] RT check machine to see if this is repairable. Pt states she only has had the one machine in 2005. [facility] to contact pt to arrange RT to check machine. Pt and daughter aware. Rhonda J Cobb    ---  ATC PT fast busy signal [facility]

## 2014-11-03 NOTE — Telephone Encounter (Signed)
Spoke with Leafy Ro at [facility]. She is aware that the pt has not been contacted. Will have RT contact her today about getting the machine checked.  Pt is aware that she should hear from [facility] some time soon. She agreed and verbalized understanding.

## 2014-11-03 NOTE — Telephone Encounter (Signed)
Mandy with [facility] at [phone]. cb

## 2014-11-03 NOTE — Telephone Encounter (Signed)
Spoke with pt.  States [facility] RT has not yet been out to check on cpap.  Pt reports cpap is not working at all now.  Advised pt would call [facility] to let them know her cpap is not working at all now and check the status of the RT checking on machine.  Lmomtcb for [place] with [facility] at [phone].

## 2014-11-03 NOTE — Telephone Encounter (Signed)
Okay to refill? Medication is not currently on her list.

## 2014-11-08 DIAGNOSIS — E119 Type 2 diabetes mellitus without complications: Secondary | ICD-10-CM | POA: Diagnosis not present

## 2014-11-08 LAB — HM DIABETES EYE EXAM

## 2014-11-09 ENCOUNTER — Encounter: Payer: Self-pay | Admitting: Internal Medicine

## 2014-11-09 ENCOUNTER — Ambulatory Visit (INDEPENDENT_AMBULATORY_CARE_PROVIDER_SITE_OTHER): Payer: Commercial Managed Care - HMO | Admitting: Internal Medicine

## 2014-11-09 VITALS — BP 144/77 | HR 60 | Temp 98.2°F | Ht 61.0 in | Wt 238.0 lb

## 2014-11-09 DIAGNOSIS — E119 Type 2 diabetes mellitus without complications: Secondary | ICD-10-CM | POA: Diagnosis not present

## 2014-11-09 DIAGNOSIS — I272 Pulmonary hypertension, unspecified: Secondary | ICD-10-CM

## 2014-11-09 DIAGNOSIS — I10 Essential (primary) hypertension: Secondary | ICD-10-CM | POA: Diagnosis not present

## 2014-11-09 DIAGNOSIS — I270 Primary pulmonary hypertension: Secondary | ICD-10-CM | POA: Diagnosis not present

## 2014-11-09 LAB — COMPREHENSIVE METABOLIC PANEL
ALT: 52 U/L — ABNORMAL HIGH (ref 0–35)
AST: 65 U/L — ABNORMAL HIGH (ref 0–37)
Albumin: 3.7 g/dL (ref 3.5–5.2)
Alkaline Phosphatase: 122 U/L — ABNORMAL HIGH (ref 39–117)
BUN: 12 mg/dL (ref 6–23)
CO2: 34 mEq/L — ABNORMAL HIGH (ref 19–32)
Calcium: 9.6 mg/dL (ref 8.4–10.5)
Chloride: 97 mEq/L (ref 96–112)
Creatinine, Ser: 0.68 mg/dL (ref 0.40–1.20)
GFR: 109.58 mL/min (ref >60.00)
Glucose, Bld: 178 mg/dL — ABNORMAL HIGH (ref 70–99)
Potassium: 3.5 mEq/L (ref 3.5–5.1)
Sodium: 138 mEq/L (ref 135–145)
Total Bilirubin: 0.5 mg/dL (ref 0.2–1.2)
Total Protein: 7.7 g/dL (ref 6.0–8.3)

## 2014-11-09 MED ORDER — INSULIN DETEMIR 100 UNIT/ML ~~LOC~~ SOLN: 20.0000 [IU] | Freq: Every day | SUBCUTANEOUS | Status: DC

## 2014-11-09 MED ORDER — POTASSIUM CHLORIDE CRYS ER 20 MEQ PO TBCR: 20.0000 meq | EXTENDED_RELEASE_TABLET | Freq: Two times a day (BID) | ORAL | Status: DC

## 2014-11-09 NOTE — Assessment & Plan Note (Signed)
Symptomatically doing well, tolerating Letaris. Will check hepatic function today.

## 2014-11-09 NOTE — Progress Notes (Signed)
Pre visit review using our clinic review tool, if applicable. No additional management support is needed unless otherwise documented below in the visit note. 

## 2014-11-09 NOTE — Assessment & Plan Note (Signed)
BP Readings from Last 3 Encounters:  11/09/14 144/77  10/11/14 144/58  10/09/14 135/69   BP well controlled. Continue current medications.

## 2014-11-09 NOTE — Progress Notes (Signed)
Subjective:    Patient ID: Debra Saunders, female    DOB: 1945-10-17, 69 y.o.   MRN: [id number]  HPI  69YO female presents for follow up.  Last seen 09/2014 for DM. A1c was elevated at 8.9%.  DM - Most BG near 200 or over. Compliant with medication.  Continues to have some right sided rib pain. Taking Tylenol and Naprosyn with some improvement. Occasionally uses Oxycodone for severe pain, but not on a regular basis.  Symptoms of dyspnea have been relatively stable. Worsened with exertion. Improved compared to last visit somewhat, esp at rest.  Past medical, surgical, family and social history per today's encounter.  Review of Systems  Constitutional: Negative for fever, chills, appetite change, fatigue and unexpected weight change.  Eyes: Negative for visual disturbance.  Respiratory: Positive for shortness of breath.   Cardiovascular: Negative for chest pain and leg swelling.  Gastrointestinal: Negative for abdominal pain, diarrhea and constipation.  Musculoskeletal: Negative for myalgias and arthralgias.  Skin: Negative for color change and rash.  Hematological: Negative for adenopathy. Does not bruise/bleed easily.  Psychiatric/Behavioral: Negative for sleep disturbance and dysphoric mood. The patient is not nervous/anxious.        Objective:    BP 144/77 mmHg  Pulse 60  Temp(Src) 98.2 F (36.8 C) (Oral)  Ht 5\' 1"  (1.549 m)  Wt 238 lb (107.956 kg)  BMI 44.99 kg/m2  SpO2 98% Physical Exam  Constitutional: She is oriented to person, place, and time. She appears well-developed and well-nourished. No distress.  HENT:  Head: Normocephalic and atraumatic.  Right Ear: External ear normal.  Left Ear: External ear normal.  Nose: Nose normal.  Mouth/Throat: Oropharynx is clear and moist. No oropharyngeal exudate.  Eyes: Conjunctivae are normal. Pupils are equal, round, and reactive to light. Right eye exhibits no discharge. Left eye exhibits no discharge. No scleral  icterus.  Neck: Normal range of motion. Neck supple. No tracheal deviation present. No thyromegaly present.  Cardiovascular: Normal rate, regular rhythm, normal heart sounds and intact distal pulses.  Exam reveals no gallop and no friction rub.   No murmur heard. Pulmonary/Chest: Effort normal and breath sounds normal. No accessory muscle usage. No respiratory distress. She has no decreased breath sounds. She has no wheezes. She has no rhonchi. She has no rales. She exhibits no tenderness.  Air movement improved compared to previous exam  Musculoskeletal: Normal range of motion. She exhibits edema (left >right LE). She exhibits no tenderness.  Lymphadenopathy:    She has no cervical adenopathy.  Neurological: She is alert and oriented to person, place, and time. No cranial nerve deficit. She exhibits normal muscle tone. Coordination normal.  Skin: Skin is warm and dry. No rash noted. She is not diaphoretic. No erythema. No pallor.  Psychiatric: She has a normal mood and affect. Her behavior is normal. Judgment and thought content normal.          Assessment & Plan:   Problem List Items Addressed This Visit      Unprioritized   Diabetes mellitus type 2, controlled - Primary    BG poorly controlled. Will increase Levemir to 20 units daily. Stop Glipizide. Follow up in 4 weeks.      Relevant Medications   insulin detemir (LEVEMIR) 100 UNIT/ML injection   Other Relevant Orders   Comprehensive metabolic panel   Hypertension    BP Readings from Last 3 Encounters:  11/09/14 144/77  10/11/14 144/58  10/09/14 135/69   BP well controlled. Continue  current medications.      Pulmonary hypertension    Symptomatically doing well, tolerating Letaris. Will check hepatic function today.          Return in about 4 weeks (around 12/07/2014) for Recheck.

## 2014-11-09 NOTE — Assessment & Plan Note (Signed)
BG poorly controlled. Will increase Levemir to 20 units daily. Stop Glipizide. Follow up in 4 weeks.

## 2014-11-09 NOTE — Patient Instructions (Signed)
Increase Levemir to 20units daily.  Check blood sugars twice daily.  Email with blood sugar readings next week.  Goal fasting blood sugar 100-150.

## 2014-11-10 ENCOUNTER — Encounter: Payer: Self-pay | Admitting: Cardiology

## 2014-11-15 ENCOUNTER — Encounter: Payer: Self-pay | Admitting: *Deleted

## 2014-11-17 ENCOUNTER — Encounter: Payer: Self-pay | Admitting: Internal Medicine

## 2014-11-21 ENCOUNTER — Telehealth: Payer: Self-pay | Admitting: Pulmonary Disease

## 2014-11-21 NOTE — Telephone Encounter (Signed)
Mel Almond returned call  [phone]

## 2014-11-21 NOTE — Telephone Encounter (Signed)
lmtcb

## 2014-11-21 NOTE — Telephone Encounter (Signed)
Called and spoke to [facility]. Debra Saunders is requesting to O2 sats faxed to [phone]. Last OV note faxed to requested number--looks as though the pt has been on O2 since 2004 and no current RA sats are in chart. Debra Saunders also requested to have O2 and CPAP rx signed and faxed back. Debra Saunders is re-faxing the scripts to triage.   Will forward to [place] to look out for.

## 2014-11-21 NOTE — Telephone Encounter (Signed)
Mel Almond returned call  [phone]

## 2014-11-21 NOTE — Telephone Encounter (Signed)
Spoke with Mel Almond at  [facility], states that pt needs new cpap machine with pressure settings.  Also needs 02 rx and clinical testing showing pt dropping below 88% on room air.  I advised Mel Almond that BQ is back in office tomorrow and I will have these forms signed and faxed then.  Will hold in my box as a reminder.

## 2014-11-22 NOTE — Telephone Encounter (Signed)
I still have not received the below rx's for cpap and 02.  Triage can you see if this has been sent to triage fax, or if I need to re-request it?

## 2014-11-22 NOTE — Telephone Encounter (Signed)
Faxed received and given to [facility].

## 2014-11-23 ENCOUNTER — Encounter: Payer: Self-pay | Admitting: Internal Medicine

## 2014-11-23 ENCOUNTER — Telehealth: Payer: Self-pay | Admitting: Pulmonary Disease

## 2014-11-23 NOTE — Telephone Encounter (Signed)
lmtcb X1 for Mel Almond with [facility].

## 2014-11-23 NOTE — Telephone Encounter (Signed)
Spoke with Debra Saunders at [facility], states that if there is not a qualifying sat on file then pt will need to be scheduled for a qualifying walk.   I've looked through chart and it looks like all walks done in our office have been on room air.    Called and spoke with pt to schedule a time for her to come in for a qualifying walk test only in [place].  Pt states her daughter is her transportation and will have to check with her daughter's work schedule before scheduling a time to come in to [place] office for this qualifying walk.    Will await call.  Pt only needs to be put on [place] PFT schedule for qualifying walk test.

## 2014-11-24 NOTE — Telephone Encounter (Signed)
Called patient and scheduled qualifying walk test on PFT schedule per Ashley's request below.  Nothing further needed.

## 2014-11-28 ENCOUNTER — Ambulatory Visit (INDEPENDENT_AMBULATORY_CARE_PROVIDER_SITE_OTHER): Payer: Commercial Managed Care - HMO | Admitting: Pulmonary Disease

## 2014-11-28 DIAGNOSIS — J9611 Chronic respiratory failure with hypoxia: Secondary | ICD-10-CM

## 2014-11-28 DIAGNOSIS — R0600 Dyspnea, unspecified: Secondary | ICD-10-CM | POA: Diagnosis not present

## 2014-11-28 NOTE — Progress Notes (Signed)
Patient here for oxygen qualification beginning 02 96 % P 99. Oxygen was removed for 5 minutes recheck oxygen 94% P 107.  We began the walk and patient finally desaturated after 30 mins. 87% Put her on 1 liter she continued to drop 78%. Put her on 2 L  She walk another 5 mins and remained 98% P 66.

## 2014-11-29 ENCOUNTER — Encounter: Payer: Self-pay | Admitting: Internal Medicine

## 2014-12-01 DIAGNOSIS — I10 Essential (primary) hypertension: Secondary | ICD-10-CM | POA: Diagnosis not present

## 2014-12-01 DIAGNOSIS — E119 Type 2 diabetes mellitus without complications: Secondary | ICD-10-CM | POA: Diagnosis not present

## 2014-12-01 DIAGNOSIS — Z950 Presence of cardiac pacemaker: Secondary | ICD-10-CM | POA: Diagnosis not present

## 2014-12-01 DIAGNOSIS — Z8673 Personal history of transient ischemic attack (TIA), and cerebral infarction without residual deficits: Secondary | ICD-10-CM | POA: Diagnosis not present

## 2014-12-01 DIAGNOSIS — M65311 Trigger thumb, right thumb: Secondary | ICD-10-CM | POA: Diagnosis not present

## 2014-12-01 DIAGNOSIS — I509 Heart failure, unspecified: Secondary | ICD-10-CM | POA: Diagnosis not present

## 2014-12-06 DIAGNOSIS — R42 Dizziness and giddiness: Secondary | ICD-10-CM | POA: Diagnosis not present

## 2014-12-06 DIAGNOSIS — R569 Unspecified convulsions: Secondary | ICD-10-CM | POA: Diagnosis not present

## 2014-12-06 DIAGNOSIS — R251 Tremor, unspecified: Secondary | ICD-10-CM | POA: Diagnosis not present

## 2014-12-06 DIAGNOSIS — R51 Headache: Secondary | ICD-10-CM | POA: Diagnosis not present

## 2014-12-14 ENCOUNTER — Encounter: Payer: Self-pay | Admitting: Internal Medicine

## 2014-12-14 ENCOUNTER — Ambulatory Visit (INDEPENDENT_AMBULATORY_CARE_PROVIDER_SITE_OTHER): Payer: Commercial Managed Care - HMO | Admitting: Internal Medicine

## 2014-12-14 VITALS — BP 142/77 | HR 66 | Temp 98.6°F | Resp 16 | Ht 61.0 in | Wt 240.9 lb

## 2014-12-14 DIAGNOSIS — J9611 Chronic respiratory failure with hypoxia: Secondary | ICD-10-CM | POA: Diagnosis not present

## 2014-12-14 DIAGNOSIS — I10 Essential (primary) hypertension: Secondary | ICD-10-CM

## 2014-12-14 DIAGNOSIS — E119 Type 2 diabetes mellitus without complications: Secondary | ICD-10-CM

## 2014-12-14 MED ORDER — "INSULIN SYRINGE 28G X 1/2"" 1 ML MISC": Status: DC

## 2014-12-14 MED ORDER — INSULIN DETEMIR 100 UNIT/ML ~~LOC~~ SOLN: 40.0000 [IU] | Freq: Every day | SUBCUTANEOUS | Status: DC

## 2014-12-14 NOTE — Assessment & Plan Note (Signed)
BP Readings from Last 3 Encounters:  12/14/14 142/77  11/09/14 144/77  10/11/14 144/58   BP generally well controlled. Renal function with labs.

## 2014-12-14 NOTE — Assessment & Plan Note (Signed)
Chronic respiratory failure. Gave her contact numbers for [employer] to ensure she has oxygen delivered to her home. Followup prn.

## 2014-12-14 NOTE — Progress Notes (Signed)
Subjective:    Patient ID: Debra Saunders, female    DOB: Jun 20, 1946, 69 y.o.   MRN: [id number]  HPI  69YO female presents for follow up.  DM - Last visit, dose of Levemir increased to 30units daily. Fasting BG near 160. Post prandial near 300.  Notes that her insurance has changed and she may be without oxygen. Her provider has been changed from [place] to [place]. She is awaiting approval from [place] for new oxygen.  She also notes she was seen by neurology and has carotid dopplers scheduled for tomorrow.    Past medical, surgical, family and social history per today's encounter.  Review of Systems  Constitutional: Negative for fever, chills, appetite change, fatigue and unexpected weight change.  Eyes: Negative for visual disturbance.  Respiratory: Negative for shortness of breath.   Cardiovascular: Positive for leg swelling. Negative for chest pain.  Gastrointestinal: Negative for nausea, vomiting, abdominal pain, diarrhea and constipation.  Musculoskeletal: Positive for gait problem. Negative for neck pain.  Skin: Negative for color change and rash.  Neurological: Positive for dizziness (with turning head to right) and weakness. Negative for light-headedness and headaches.  Hematological: Negative for adenopathy. Does not bruise/bleed easily.  Psychiatric/Behavioral: Negative for sleep disturbance and dysphoric mood. The patient is not nervous/anxious.        Objective:    BP 142/77 mmHg  Pulse 66  Temp(Src) 98.6 F (37 C)  Resp 16  Ht 5\' 1"  (1.549 m)  Wt 240 lb 14.4 oz (109.272 kg)  BMI 45.54 kg/m2  SpO2 96% Physical Exam  Constitutional: She is oriented to person, place, and time. She appears well-developed and well-nourished. No distress.  HENT:  Head: Normocephalic and atraumatic.  Right Ear: External ear normal.  Left Ear: External ear normal.  Nose: Nose normal.  Mouth/Throat: Oropharynx is clear and moist. No oropharyngeal exudate.  Eyes: Conjunctivae  are normal. Pupils are equal, round, and reactive to light. Right eye exhibits no discharge. Left eye exhibits no discharge. No scleral icterus.  Neck: Normal range of motion. Neck supple. No tracheal deviation present. No thyromegaly present.  Cardiovascular: Normal rate, regular rhythm, normal heart sounds and intact distal pulses.  Exam reveals no gallop and no friction rub.   No murmur heard. Pulmonary/Chest: Effort normal and breath sounds normal. No respiratory distress. She has no wheezes. She has no rales. She exhibits no tenderness.  Musculoskeletal: Normal range of motion. She exhibits edema (bilateral LE). She exhibits no tenderness.  Lymphadenopathy:    She has no cervical adenopathy.  Neurological: She is alert and oriented to person, place, and time. No cranial nerve deficit. She exhibits normal muscle tone. Coordination normal.  Skin: Skin is warm and dry. No rash noted. She is not diaphoretic. No erythema. No pallor.  Psychiatric: She has a normal mood and affect. Her behavior is normal. Judgment and thought content normal.          Assessment & Plan:   Problem List Items Addressed This Visit      Unprioritized   Chronic respiratory failure with hypoxia    Chronic respiratory failure. Gave her contact numbers for [employer] to ensure she has oxygen delivered to her home. Followup prn.      Diabetes mellitus type 2, controlled - Primary    BG continue to be elevated. Will increase Levemir to 40 units daily. Discussed potentially adding back Metformin and/or other medications, however she would like to hold off for now. Follow up by email every  1-2 weeks and then in clinic in 3 months.      Relevant Medications   insulin detemir (LEVEMIR) 100 UNIT/ML injection   Other Relevant Orders   Comprehensive metabolic panel   Hemoglobin A1c   Hypertension    BP Readings from Last 3 Encounters:  12/14/14 142/77  11/09/14 144/77  10/11/14 144/58   BP generally well controlled.  Renal function with labs.          Return in about 3 months (around 03/16/2015) for Recheck of Diabetes.

## 2014-12-14 NOTE — Progress Notes (Signed)
Pre visit review using our clinic review tool, if applicable. No additional management support is needed unless otherwise documented below in the visit note. 

## 2014-12-14 NOTE — Patient Instructions (Addendum)
Labs today.  Increase Levemir to 40 units daily.  Continue to email with blood sugar readings.

## 2014-12-14 NOTE — Assessment & Plan Note (Signed)
BG continue to be elevated. Will increase Levemir to 40 units daily. Discussed potentially adding back Metformin and/or other medications, however she would like to hold off for now. Follow up by email every 1-2 weeks and then in clinic in 3 months.

## 2014-12-15 ENCOUNTER — Telehealth: Payer: Self-pay | Admitting: Pulmonary Disease

## 2014-12-15 DIAGNOSIS — R42 Dizziness and giddiness: Secondary | ICD-10-CM | POA: Diagnosis not present

## 2014-12-15 DIAGNOSIS — I6522 Occlusion and stenosis of left carotid artery: Secondary | ICD-10-CM | POA: Diagnosis not present

## 2014-12-15 DIAGNOSIS — I509 Heart failure, unspecified: Secondary | ICD-10-CM | POA: Diagnosis not present

## 2014-12-15 LAB — COMPREHENSIVE METABOLIC PANEL
ALT: 60 U/L — ABNORMAL HIGH (ref 0–35)
AST: 101 U/L — ABNORMAL HIGH (ref 0–37)
Albumin: 3.6 g/dL (ref 3.5–5.2)
Alkaline Phosphatase: 124 U/L — ABNORMAL HIGH (ref 39–117)
BUN: 11 mg/dL (ref 6–23)
CO2: 29 mEq/L (ref 19–32)
Calcium: 9.7 mg/dL (ref 8.4–10.5)
Chloride: 99 mEq/L (ref 96–112)
Creatinine, Ser: 0.72 mg/dL (ref 0.40–1.20)
GFR: 102.56 mL/min (ref >60.00)
Glucose, Bld: 149 mg/dL — ABNORMAL HIGH (ref 70–99)
Potassium: 3.7 mEq/L (ref 3.5–5.1)
Sodium: 137 mEq/L (ref 135–145)
Total Bilirubin: 0.5 mg/dL (ref 0.2–1.2)
Total Protein: 8.1 g/dL (ref 6.0–8.3)

## 2014-12-15 LAB — HEMOGLOBIN A1C: Hgb A1c MFr Bld: 8 % — ABNORMAL HIGH (ref 4.6–6.5)

## 2014-12-15 NOTE — Telephone Encounter (Signed)
Spoke with rep at [employer], states that pt's current 02 qualifying sats need to be faxed to below fax #. Attempted to fax to this #, is was a phone line that was not active.    Called [employer] back, was given an alternative fax #.  This has been successfully sent.  Nothing further needed.

## 2014-12-15 NOTE — Telephone Encounter (Signed)
Message not needed, pt will call [employer]/spm

## 2014-12-20 DIAGNOSIS — G4733 Obstructive sleep apnea (adult) (pediatric): Secondary | ICD-10-CM | POA: Diagnosis not present

## 2014-12-21 ENCOUNTER — Other Ambulatory Visit: Payer: Self-pay | Admitting: Internal Medicine

## 2014-12-21 NOTE — Progress Notes (Signed)
Quick Note:  Called and spoke to pt. Appt made with BQ for 8.2.16. Pt verbalized understanding and denied any further questions or concerns at this time.   ______

## 2014-12-28 ENCOUNTER — Encounter: Payer: Self-pay | Admitting: Internal Medicine

## 2014-12-29 ENCOUNTER — Other Ambulatory Visit (INDEPENDENT_AMBULATORY_CARE_PROVIDER_SITE_OTHER): Payer: Commercial Managed Care - HMO

## 2014-12-29 DIAGNOSIS — Z79899 Other long term (current) drug therapy: Secondary | ICD-10-CM | POA: Diagnosis not present

## 2014-12-29 LAB — HEPATIC FUNCTION PANEL
ALT: 42 U/L — ABNORMAL HIGH (ref 0–35)
AST: 48 U/L — ABNORMAL HIGH (ref 0–37)
Albumin: 3.6 g/dL (ref 3.5–5.2)
Alkaline Phosphatase: 118 U/L — ABNORMAL HIGH (ref 39–117)
Bilirubin, Direct: 0.2 mg/dL (ref 0.0–0.3)
Total Bilirubin: 0.5 mg/dL (ref 0.2–1.2)
Total Protein: 7.9 g/dL (ref 6.0–8.3)

## 2015-01-03 ENCOUNTER — Encounter: Payer: Self-pay | Admitting: Internal Medicine

## 2015-01-09 ENCOUNTER — Encounter: Payer: Self-pay | Admitting: Internal Medicine

## 2015-01-14 DIAGNOSIS — I509 Heart failure, unspecified: Secondary | ICD-10-CM | POA: Diagnosis not present

## 2015-01-15 LAB — CBC
HCT: 36.8 % (ref 35.0–47.0)
HGB: 11.3 g/dL — ABNORMAL LOW (ref 12.0–16.0)
MCH: 26.1 pg (ref 26.0–34.0)
MCHC: 30.7 g/dL — ABNORMAL LOW (ref 32.0–36.0)
MCV: 85 fL (ref 80–100)
Platelet: 233 10*3/uL (ref 150–440)
RBC: 4.32 10*6/uL (ref 3.80–5.20)
RDW: 14 % (ref 11.5–14.5)
WBC: 7.4 10*3/uL (ref 3.6–11.0)

## 2015-01-16 ENCOUNTER — Encounter: Payer: Self-pay | Admitting: Internal Medicine

## 2015-01-16 ENCOUNTER — Ambulatory Visit (INDEPENDENT_AMBULATORY_CARE_PROVIDER_SITE_OTHER): Payer: Commercial Managed Care - HMO | Admitting: *Deleted

## 2015-01-16 DIAGNOSIS — Z950 Presence of cardiac pacemaker: Secondary | ICD-10-CM

## 2015-01-16 DIAGNOSIS — I495 Sick sinus syndrome: Secondary | ICD-10-CM

## 2015-01-19 DIAGNOSIS — G4733 Obstructive sleep apnea (adult) (pediatric): Secondary | ICD-10-CM | POA: Diagnosis not present

## 2015-01-23 ENCOUNTER — Ambulatory Visit (INDEPENDENT_AMBULATORY_CARE_PROVIDER_SITE_OTHER): Payer: Commercial Managed Care - HMO | Admitting: Internal Medicine

## 2015-01-23 ENCOUNTER — Ambulatory Visit: Payer: Commercial Managed Care - HMO | Admitting: Pulmonary Disease

## 2015-01-23 ENCOUNTER — Encounter: Payer: Self-pay | Admitting: Internal Medicine

## 2015-01-23 VITALS — BP 140/90 | HR 83 | Ht 61.0 in | Wt 238.0 lb

## 2015-01-23 DIAGNOSIS — I5032 Chronic diastolic (congestive) heart failure: Secondary | ICD-10-CM | POA: Diagnosis not present

## 2015-01-23 DIAGNOSIS — I609 Nontraumatic subarachnoid hemorrhage, unspecified: Secondary | ICD-10-CM

## 2015-01-23 DIAGNOSIS — R1011 Right upper quadrant pain: Secondary | ICD-10-CM

## 2015-01-23 DIAGNOSIS — J8410 Pulmonary fibrosis, unspecified: Secondary | ICD-10-CM

## 2015-01-23 DIAGNOSIS — I272 Pulmonary hypertension, unspecified: Secondary | ICD-10-CM

## 2015-01-23 DIAGNOSIS — R5381 Other malaise: Secondary | ICD-10-CM | POA: Diagnosis not present

## 2015-01-23 DIAGNOSIS — R7989 Other specified abnormal findings of blood chemistry: Secondary | ICD-10-CM

## 2015-01-23 DIAGNOSIS — I2699 Other pulmonary embolism without acute cor pulmonale: Secondary | ICD-10-CM

## 2015-01-23 DIAGNOSIS — R0600 Dyspnea, unspecified: Secondary | ICD-10-CM | POA: Diagnosis not present

## 2015-01-23 DIAGNOSIS — R945 Abnormal results of liver function studies: Secondary | ICD-10-CM

## 2015-01-23 DIAGNOSIS — G4733 Obstructive sleep apnea (adult) (pediatric): Secondary | ICD-10-CM

## 2015-01-23 DIAGNOSIS — J9611 Chronic respiratory failure with hypoxia: Secondary | ICD-10-CM | POA: Diagnosis not present

## 2015-01-23 DIAGNOSIS — I270 Primary pulmonary hypertension: Secondary | ICD-10-CM

## 2015-01-23 NOTE — Assessment & Plan Note (Signed)
--  04/2012 CPAP titration study 13cm of H20 ideal

## 2015-01-23 NOTE — Patient Instructions (Addendum)
--  CPAP titration overnight with an auto-cpap machine.  --Follow up with [place] in 3 months.

## 2015-01-23 NOTE — Assessment & Plan Note (Signed)
--  Likely multifactorial due to multiple noted other conditions.

## 2015-01-23 NOTE — Assessment & Plan Note (Signed)
-  Likely contributing to dyspnea. -The patient has completed pulmonary rehabilitation in the past with good benefit.

## 2015-01-23 NOTE — Assessment & Plan Note (Signed)
-  Status post IVC filter. 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

## 2015-01-23 NOTE — Assessment & Plan Note (Signed)
--  Currently on letairis.  --2004 [facility]: right heart catheterization from 10/25/02 revealed a PA pressure of 62/25 (mean 36) and a mean PA pressure decreased remarkably to 20 during this catheterization. A PCWP was normal at 6 and a cardiac index of 2.42 L/m/m2 with an initial increase in vasodilator therapy to 2.71 L/m/m2

## 2015-01-23 NOTE — Assessment & Plan Note (Signed)
--  2013 simple spirometry normal Oxygen 3 L continuously

## 2015-01-23 NOTE — Addendum Note (Signed)
Addended by: Oscar La R on: 01/23/2015 12:37 PM   Modules accepted: Orders

## 2015-01-23 NOTE — Assessment & Plan Note (Addendum)
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  -Apparently her pulmonary status has been stable without worsening in the underlying pulmonary disease.

## 2015-01-23 NOTE — Progress Notes (Signed)
[facility]    Assessment and Plan: Chronic diastolic heart failure -Follows with Dr. Fletcher Anon.   Pulmonary hypertension --Currently on letairis, continue hepatic tests every 3 months.  --2004 [facility]: right heart catheterization from 10/25/02 revealed a PA pressure of 62/25 (mean 36) and a mean PA pressure decreased remarkably to 20 during this catheterization. A PCWP was normal at 6 and a cardiac index of 2.42 L/m/m2 with an initial increase in vasodilator therapy to 2.71 L/m/m2 -The cause of this is multifactorial due to CHF, thromboembolic disease, primary pulmonary disease.    Pulmonary embolus -Status post IVC filter. 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  OSA (obstructive sleep apnea) --04/2012 CPAP titration study 13cm of H20 ideal --Has not had the ordered re-titration study done yet. Will set her up for an auto-titration study to see if this is appropriate, if not possible, will continue current settings.   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  -Apparently her pulmonary status has been stable without worsening in the underlying pulmonary disease.      Date: 01/23/2015  MRN# [id number] Debra Saunders Jul 26, 1945   CC:  Chief Complaint  Patient presents with  . Follow-up    SOB extreme w/activity; cough; no chest tightness/pain    HPI:    Debra Saunders is a very pleasant female with obstructive sleep apnea, obesity, diastolic dysfunction, and pulmonary hypertension who was referred to the [facility] pulmonary clinic in November 2013 for evaluation of pulmonary hypertension. In 2005 she established care with Dr. Thom Chimes at [facility] for evaluation of the same. Dr. Gaynell Face felt that her pulmonary hypertension was do to her congestive heart failure, obesity, and  obstructive sleep apnea. Unfortunately she lost her insurance around 2008 and did not have regular pulmonary visits. During the interval from 2008 2013 her shortness of breath increased and she gained about 50 pounds. She had a left heart catheterization in 2013 which showed very high filling pressures of the left ventricle as well as high systemic pressures but normal coronary arteries. In November 2013 her simple spirometry was completely normal. She also sees Dr. Fletcher Anon as her cardiologist for CHF. She was noted to have chronic interstitial lung disease on a CT scan, this was felt to be non-progressive, therefore she did not undergo biopsy.   At her last visit, she was referred for a repeat titration study, cardiac evaluation for diastolic heart failure and also referred to pulmonary rehabilitation.  Since her last visit here about 3 months ago she feels that breathing is stable, and is about the same. She gets winded with routine activity, but she is able to do them as long as she paces herself. She wears oxygen at 3L at all times.  She is not using any inhalers currently, she has tried them in the past but did not help.  She has continued on letairis, she is getting blood work done every 3 months and these have been stable.   Review of old testing:  2004 [facility]: right heart catheterization from 10/25/02 revealed a PA pressure of 62/25 (mean 36) and a mean PA pressure decreased remarkably to 20 during this catheterization. A PCWP was normal at 6 and a cardiac index of 2.42 L/m/m2 with an initial increase in vasodilator therapy to 2.71 L/m/m2 2004 6MW 313 meters (1,027 feet) On oxygen in 2004 04/22/2012 6  minute walk>> total distance walked 950 feet, heart rate ranged between 56 and 88, O2 saturation ranged between 91 and 98% on 2 L nasal cannula 11-26 Spirometry >> no obstruction, FEV1 1.42 L (70%) 11-26 6MW >> 795 Ft, O2 saturation 90% lowest on 2L 07/2012 6MW >> 1115 feet, O2 saturation 97%  2LNC 03/2014 CT chest > Upper lobe and perihilar predominant ground glass and reticular coarsening and traction bronchiectasis and architectural distortion suggesting postinflammatory fibrosis. Sarcoid considered considering hilar lymph nodes, enlarged pulmonary arteries indicative of pulmonary arterial hypertension  PMHX:   Past Medical History  Diagnosis Date  . Thyroid disorder   . Diabetes mellitus     type II  . Thyroid nodule   . Sleep apnea   . Obesity   . Hypertension   . Hyperlipidemia   . Urinary incontinence   . CHF (congestive heart failure) 2005    Dx at [facility]  . Chronic back pain   . COPD (chronic obstructive pulmonary disease)     2L [place]  . Sleep apnea   . Lipoma of back   . Arthritis   . Streptococcal bacteremia   . Acute renal failure   . Stroke   . Sepsis with metabolic encephalopathy   . Gastritis   . Symptomatic bradycardia     Medication:   Current Outpatient Rx  Name  Route  Sig  Dispense  Refill  . acetaminophen (TYLENOL) 325 MG tablet   Oral   Take 325 mg by mouth every 6 (six) hours as needed for pain.         [name] ambrisentan (LETAIRIS) 5 MG tablet   Oral   Take 1 tablet (5 mg total) by mouth daily.   30 tablet   5   . antiseptic oral rinse (BIOTENE) LIQD   Mouth Rinse   15 mLs by Mouth Rinse route as needed for dry mouth.         . insulin detemir (LEVEMIR) 100 UNIT/ML injection   Subcutaneous   Inject 0.4 mLs (40 Units total) into the skin at bedtime.   10 mL   5   . Insulin Syringe-Needle U-100 (INSULIN SYRINGE 1CC/28G) 28G X 1/2" 1 ML MISC      Use as directed with levemir insulin. Dx: E11.9   100 each   5   . meclizine (ANTIVERT) 25 MG tablet      TAKE TWO TABLETS (50 MG TOTAL) BY MOUTH TWICE DAILY AS NEEDED FOR DIZZINESS   60 tablet   0   . Menthol, Topical Analgesic, (BIOFREEZE EX)   Apply externally   Apply 1 each topically as needed (for pain).         [name] NAPROXEN DR 500 MG EC tablet      TAKE ONE TABLET BY  MOUTH TWICE DAILY AS NEEDED   60 tablet   0   . NON FORMULARY      Oxygen 3 liters daily.         . NON FORMULARY      CPAP         . ondansetron (ZOFRAN) 8 MG tablet   Oral   Take 1 tablet (8 mg total) by mouth every 8 (eight) hours as needed for nausea or vomiting.   30 tablet   2   . oxyCODONE-acetaminophen (PERCOCET/ROXICET) 5-325 MG per tablet   Oral   Take 0.5 tablets by mouth every 8 (eight) hours as needed for severe pain.         [name]  pantoprazole (PROTONIX) 40 MG tablet   Oral   Take 1 tablet (40 mg total) by mouth daily.   30 tablet   3   . polyethylene glycol (MIRALAX / GLYCOLAX) packet   Oral   Take 17 g by mouth daily as needed for moderate constipation.         . potassium chloride SA (KLOR-CON M20) 20 MEQ tablet   Oral   Take 1 tablet (20 mEq total) by mouth 2 (two) times daily.   60 tablet   6   . promethazine (PHENERGAN) 25 MG tablet   Oral   Take 25 mg by mouth every 4 (four) hours as needed for nausea.         . Simethicone (GAS-X PO)   Oral   Take 1 tablet by mouth as needed (for gas).          . torsemide (DEMADEX) 20 MG tablet   Oral   Take 1 tablet (20 mg total) by mouth 2 (two) times daily.   60 tablet   6   . verapamil (CALAN-SR) 240 MG CR tablet   Oral   Take 1 tablet (240 mg total) by mouth daily.   30 tablet   5       Allergies:  Other and Penicillins  Review of Systems: Gen:  Denies  fever, sweats, chills HEENT: Denies blurred vision, double vision,. Cvc:  No dizziness, chest pain or heaviness Resp:   Denies cough or sputum porduction. Gi: Denies swallowing difficulty. Gu:  Denies bladder incontinence, burning urine Ext:   No Joint pain, stiffness or swelling Skin: No skin rash, easy bruising or bleeding or hives Endoc:  No polyuria, polydipsia , polyphagia or weight change Psych: No depression, insomnia or hallucinations  Other:  All other systems negative  Physical Examination:   VS: BP 140/90 mmHg   Pulse 83  Ht 5\' 1"  (1.549 m)  Wt 238 lb (107.956 kg)  BMI 44.99 kg/m2  SpO2 92%  General Appearance: No distress  Neuro:without focal findings,  speech normal,  HEENT: PERRLA, EOM intact. Pulmonary: normal breath sounds, No wheezing, No rales;    CardiovascularNormal S1,S2.  No m/r/g.   Abdomen: Benign, Soft, non-tender. Renal:  No costovertebral tenderness  GU:  No performed at this time. Endoc: No evident thyromegaly, no signs of acromegaly or Cushing features Skin:   warm, no rashes, no ecchymosis  Extremities: normal, no cyanosis, clubbing, no edema, warm with normal capillary refill. Other findings:   Labs results:   Rad results:     Other:      Orders for this visit: No orders of the defined types were placed in this encounter.     Thank  you for allowing [facility] to assist in the care of your patient. Our recommendations are noted above.  Please contact us if we can be of further service.   Marda Stalker, MD.  [facility] Office Number: [phone]  Patricia Pesa, M.D.  Vilinda Boehringer, M.D.  Cheral Marker, M.D

## 2015-01-29 LAB — CUP PACEART REMOTE DEVICE CHECK
Battery Remaining Longevity: 156 mo
Battery Remaining Percentage: 100 %
Brady Statistic RA Percent Paced: 40 %
Brady Statistic RV Percent Paced: 0 %
Date Time Interrogation Session: 20160726072100
Lead Channel Impedance Value: 386 Ohm
Lead Channel Impedance Value: 636 Ohm
Lead Channel Sensing Intrinsic Amplitude: 15.8 mV
Lead Channel Sensing Intrinsic Amplitude: 2.7 mV
Lead Channel Setting Pacing Amplitude: 2 V
Lead Channel Setting Pacing Amplitude: 2.4 V
Lead Channel Setting Pacing Pulse Width: 0.4 ms
Lead Channel Setting Sensing Sensitivity: 2.5 mV
Pulse Gen Serial Number: 390330
Zone Setting Detection Interval: 375 ms

## 2015-02-03 IMAGING — CT THORAX^[ID] (ADULT)
2 of 3 series · 15 of 36 positions shown, 18 images · IV contrast (APPLIED)
Comparison: none

REASON FOR EXAM: R/O Disection
COMMENTS:

PROCEDURE:     CT  - CT CHEST WITH CONTRAST  - January 11, 2013  [DATE]
RESULT:     History: Chest pain.
Comparison Study: Chest x-ray of 07/30/2012.

[Series 4: soft tissue · axial · 0.60mm/px · z∈[-497,-233]mm · 12 of 104 slices shown, 15 images]
[im 8/104  mediastinal]
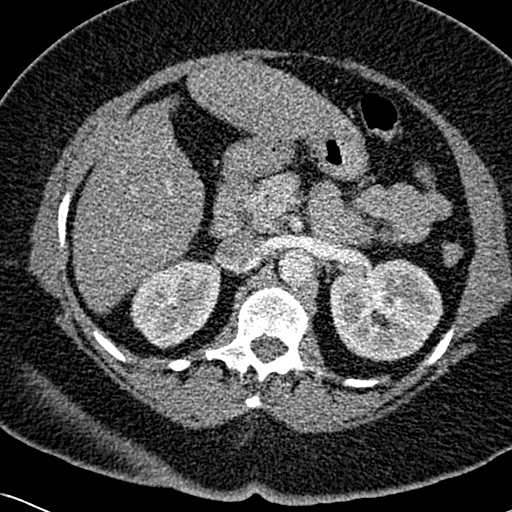
[im 8/104  lung]
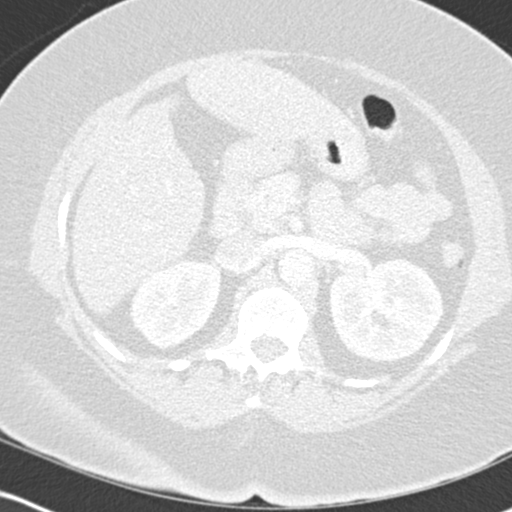
[im 16/104  lung]
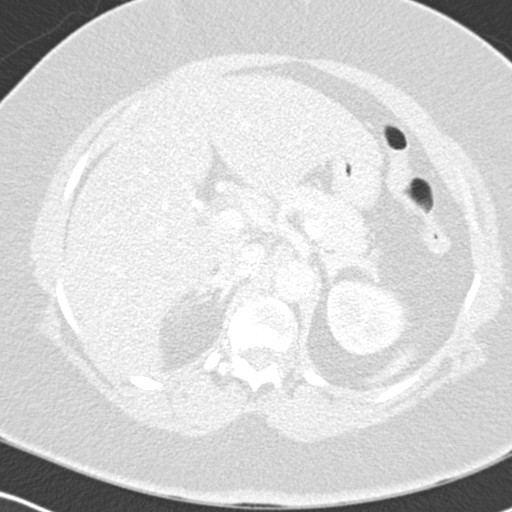
[im 23/104  lung]
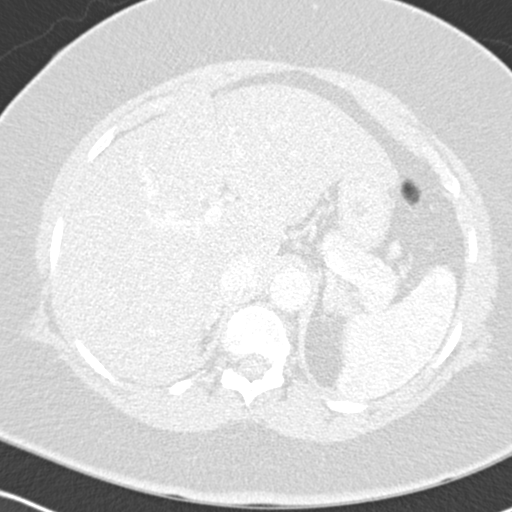
[im 31/104  lung]
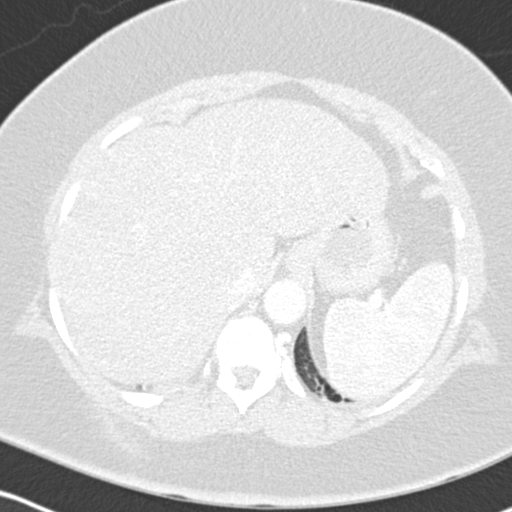
[im 39/104  mediastinal]
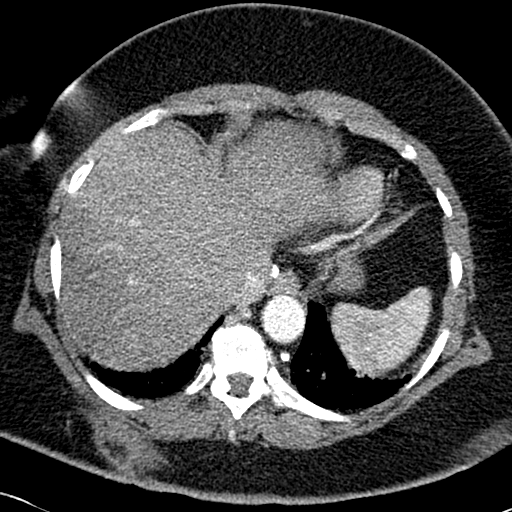
[im 39/104  lung]
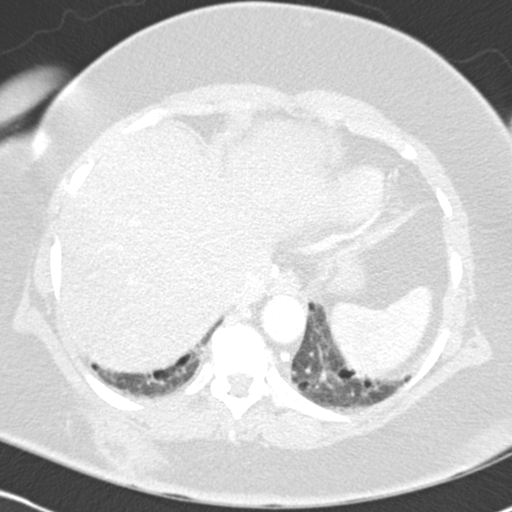
[im 46/104  lung]
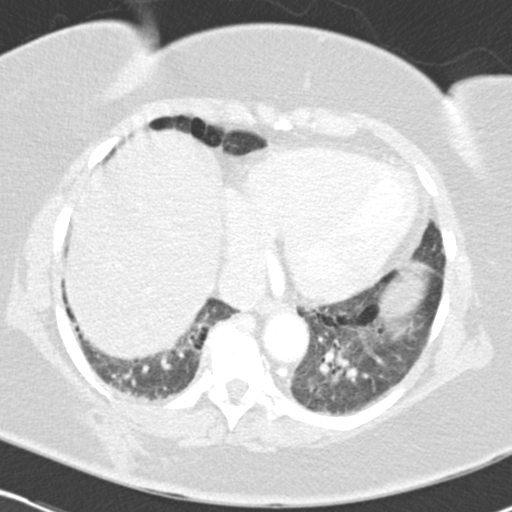
[im 58/104  lung]
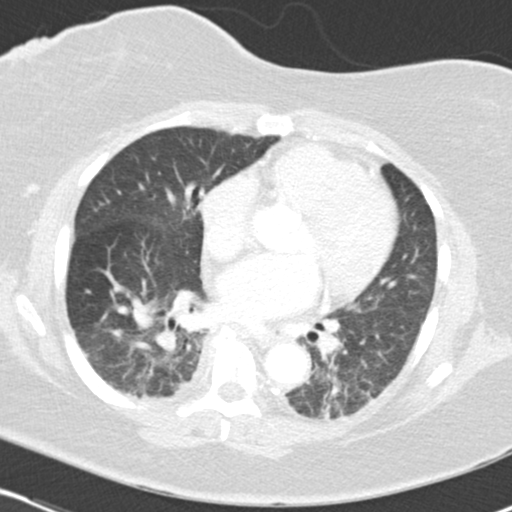
[im 65/104  lung]
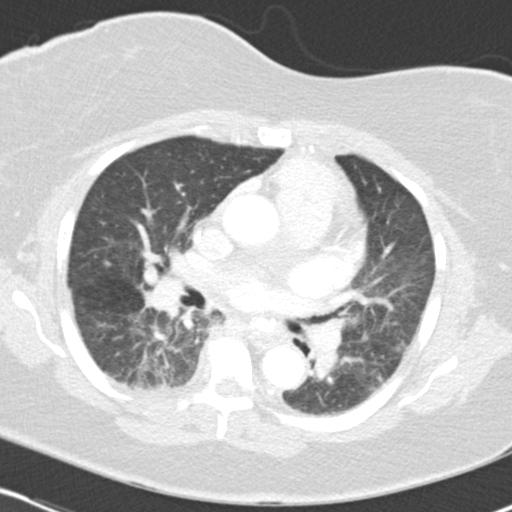
[im 73/104  mediastinal]
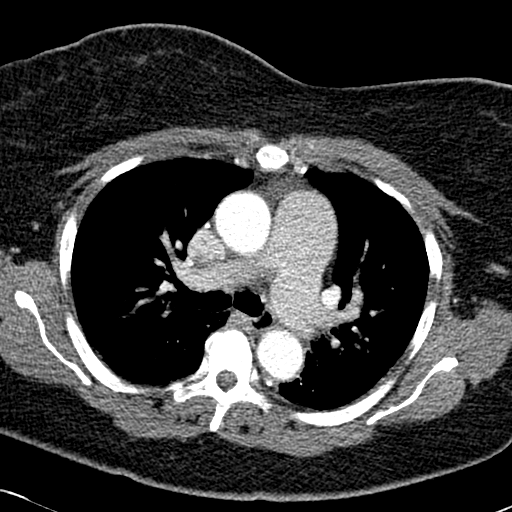
[im 73/104  lung]
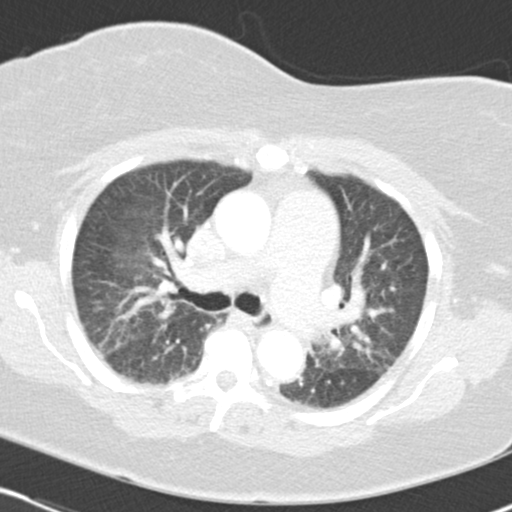
[im 81/104  lung]
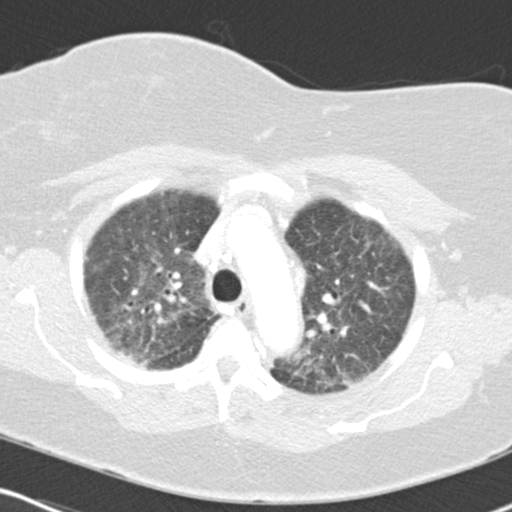
[im 88/104  lung]
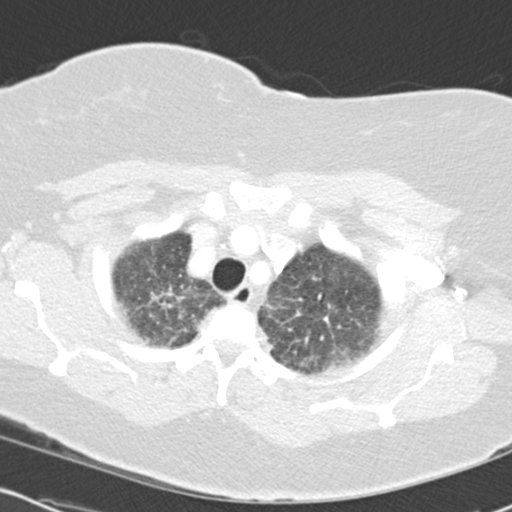
[im 96/104  lung]
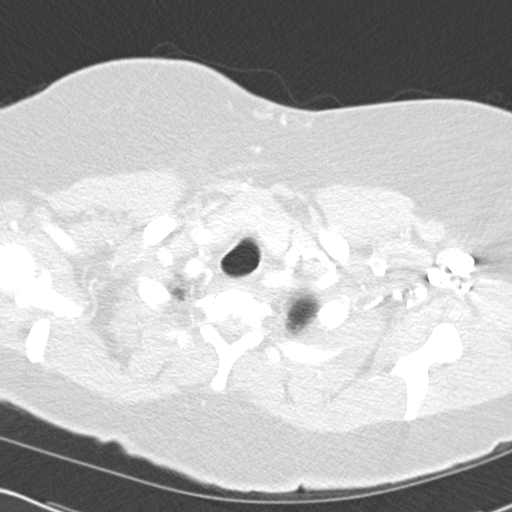

[Series 602: coronal · coronal · 0.61mm/px · 3 of 236 slices shown]
[im 48/236  lung]
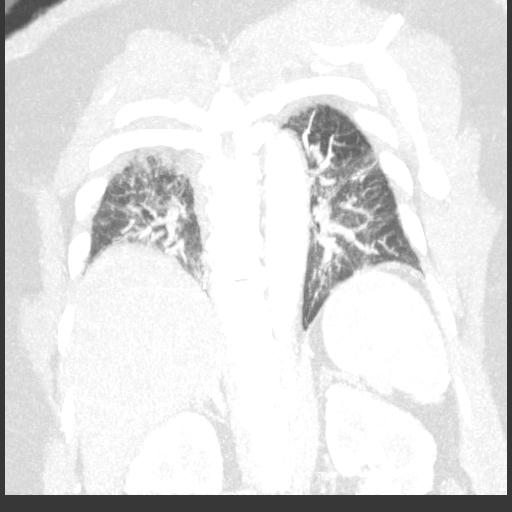
[im 95/236  lung]
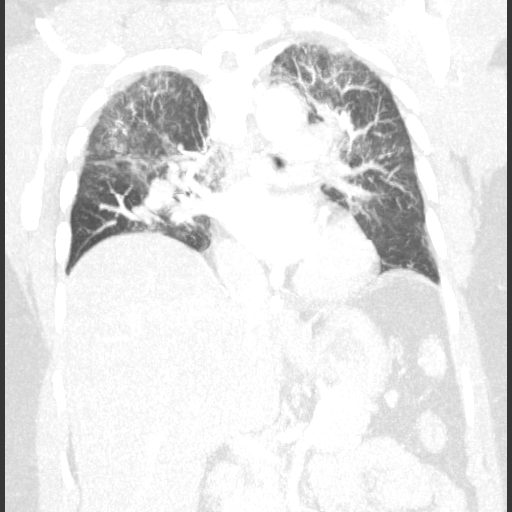
[im 142/236  lung]
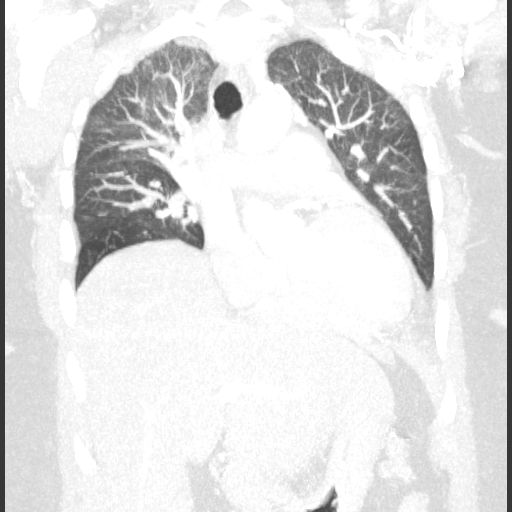

[15 of 36 positions shown; findings below may reference images not displayed]

FINDINGS: CT obtained 100 cc of [id number]. Evaluation in 3 dimensions on
separate workstation performed. Nodular densities are noted thyroid. These
are too small to characterize. Large airways are patent. The mediastinum and
hilar structures are normal. Thoracic aorta unremarkable. No dissection or
aneurysm. Pulmonary arteries are normal. Cardiomegaly. Diffuse pulmonary
interstitial prominence noted. Mild CHF cannot be excluded. Chronic
interstitial lung disease or pneumonitis could also be present. No pleural
effusion. Bullous COPD. Fatty infiltration of the liver. Cholecystectomy.
Adrenals normal. No acute osseous abnormality.
IMPRESSION: Cannot exclude mild CHF. Interstitial pneumonitis
interstitial fibrosis may also be present. COPD.

Thoracic aorta unremarkable.

## 2015-02-04 IMAGING — CR DXR CHEST PA (OR AP) AND LATERAL
1 series · 3 of 3 positions shown · non-contrast
Comparison: none

REASON FOR EXAM: chest pain
COMMENTS:

PROCEDURE:     DXR - DXR CHEST PA (OR AP) AND LATERAL  - January 12, 2013  [DATE]
RESULT:     Comparison: 07/30/2012

[Series 1: x chest ap · 0.14mm/px · 3 of 3 slices shown]
[im 1/3  full-range]
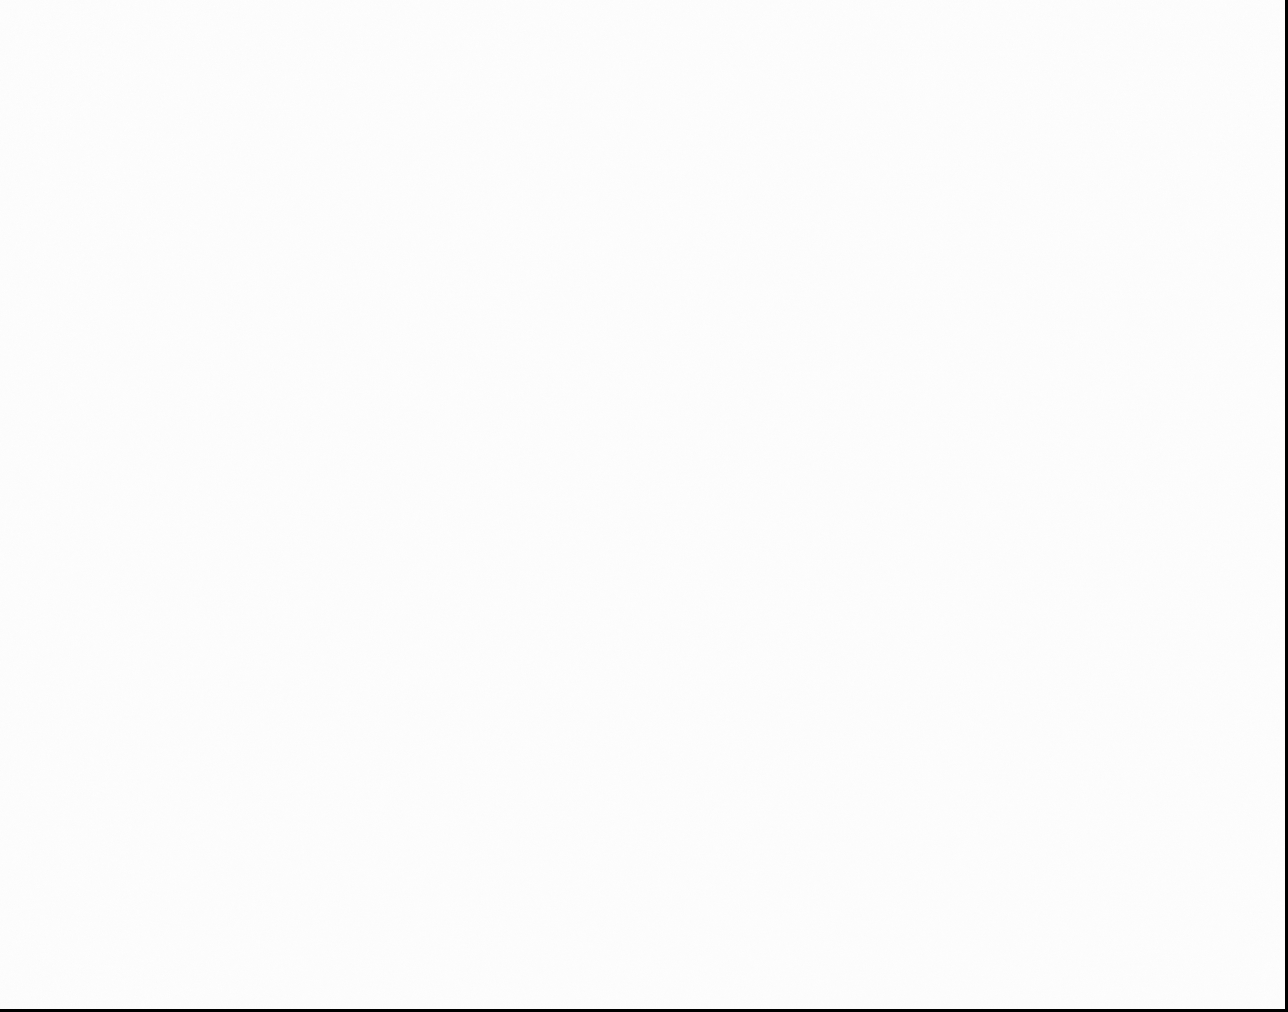
[im 2/3]
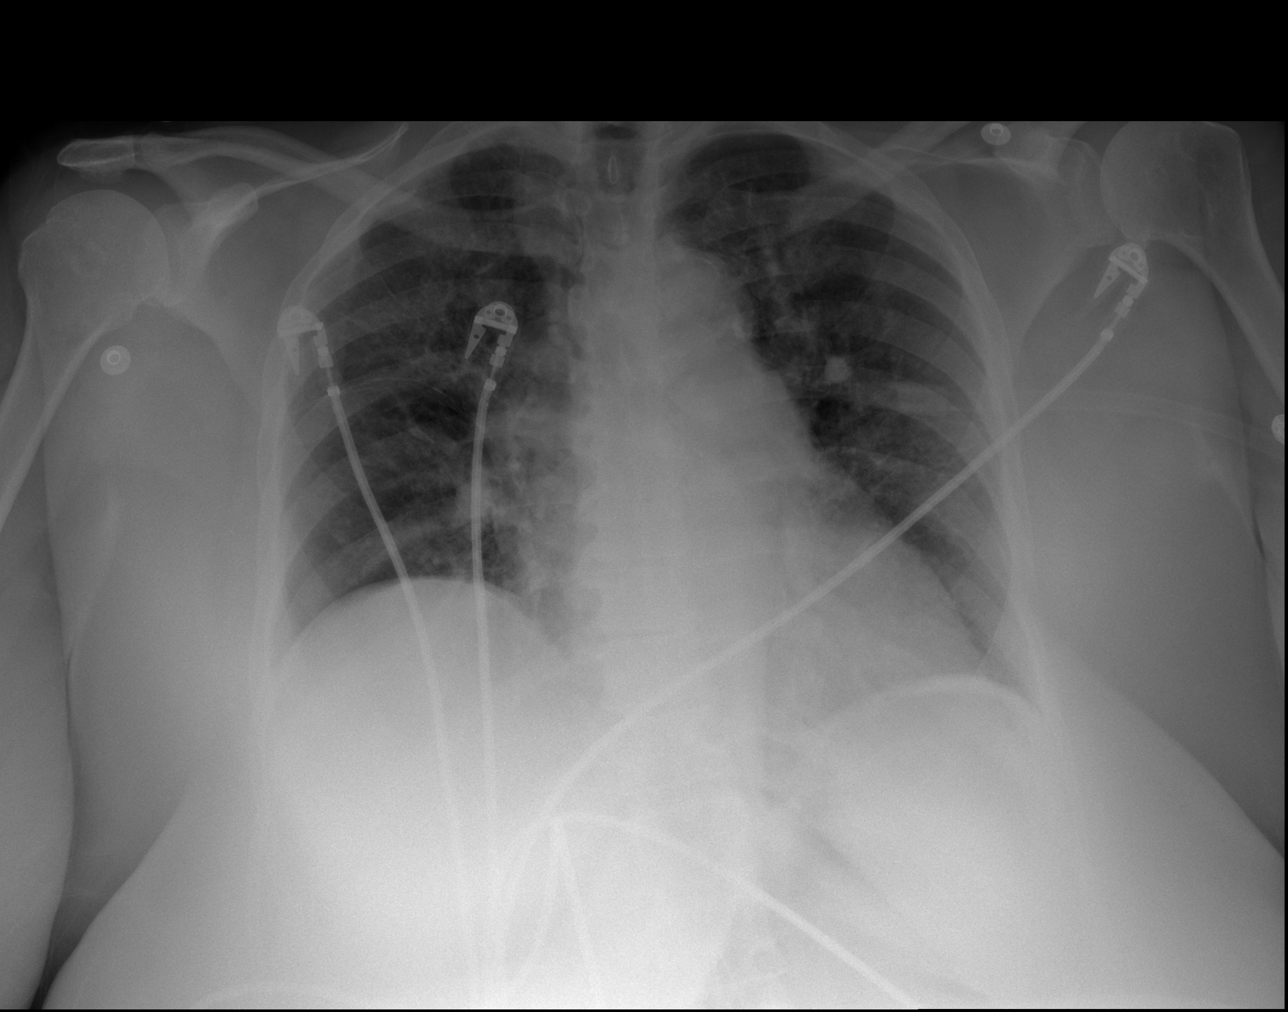
[im 3/3]
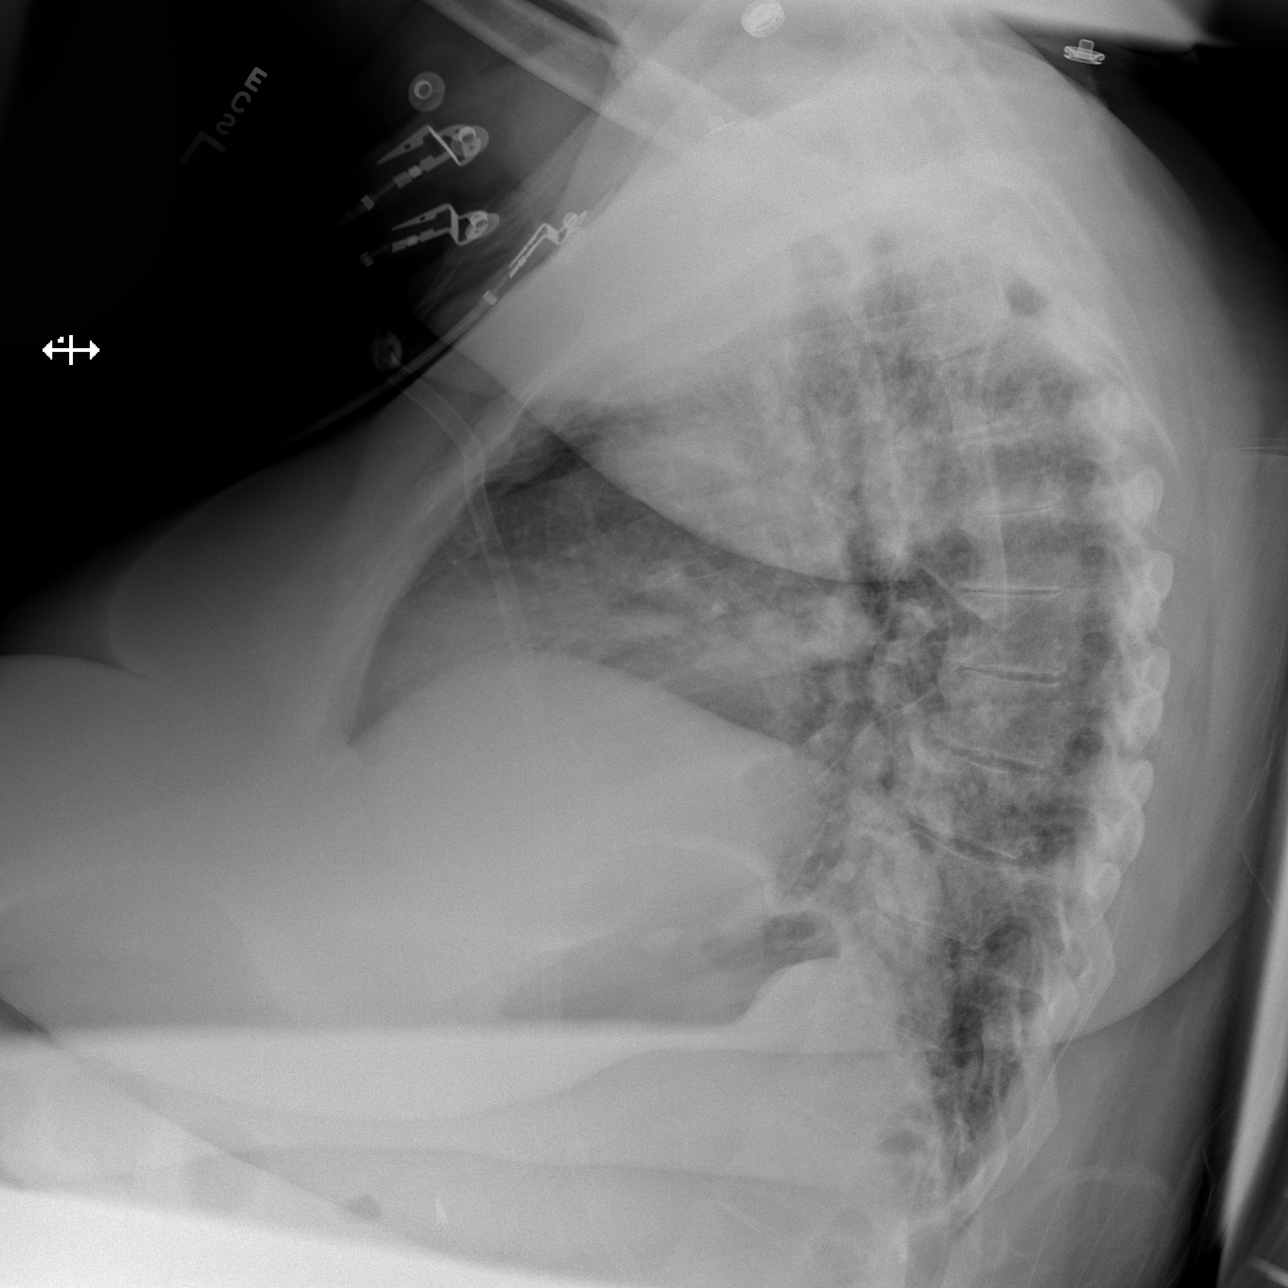

[3 of 3 positions shown; findings below may reference images not displayed]

FINDINGS: PA and lateral chest radiographs are provided. There is bilateral diffuse
interstitial thickening likely representing interstitial edema versus
interstitial pneumonitis secondary to an infectious or inflammatory
etiology. There is no focal parenchymal opacity, pleural effusion, or
pneumothorax. The heart and mediastinum are unremarkable.  The osseous
structures are unremarkable.
IMPRESSION: There is bilateral diffuse interstitial thickening likely representing
interstitial edema versus interstitial pneumonitis secondary to an
infectious or inflammatory etiology.

[REDACTED]

## 2015-02-07 ENCOUNTER — Encounter: Payer: Self-pay | Admitting: Cardiology

## 2015-02-13 ENCOUNTER — Encounter: Payer: Self-pay | Admitting: Internal Medicine

## 2015-02-14 DIAGNOSIS — I509 Heart failure, unspecified: Secondary | ICD-10-CM | POA: Diagnosis not present

## 2015-02-19 DIAGNOSIS — G4733 Obstructive sleep apnea (adult) (pediatric): Secondary | ICD-10-CM | POA: Diagnosis not present

## 2015-02-24 ENCOUNTER — Encounter: Payer: Self-pay | Admitting: Internal Medicine

## 2015-03-04 ENCOUNTER — Other Ambulatory Visit: Payer: Self-pay | Admitting: Internal Medicine

## 2015-03-05 ENCOUNTER — Other Ambulatory Visit: Payer: Self-pay

## 2015-03-17 DIAGNOSIS — I509 Heart failure, unspecified: Secondary | ICD-10-CM | POA: Diagnosis not present

## 2015-03-19 ENCOUNTER — Encounter: Payer: Self-pay | Admitting: Internal Medicine

## 2015-03-19 ENCOUNTER — Ambulatory Visit (INDEPENDENT_AMBULATORY_CARE_PROVIDER_SITE_OTHER): Payer: Commercial Managed Care - HMO | Admitting: Internal Medicine

## 2015-03-19 VITALS — BP 155/71 | HR 51 | Temp 98.5°F | Ht 61.0 in | Wt 240.0 lb

## 2015-03-19 DIAGNOSIS — I890 Lymphedema, not elsewhere classified: Secondary | ICD-10-CM | POA: Diagnosis not present

## 2015-03-19 DIAGNOSIS — E119 Type 2 diabetes mellitus without complications: Secondary | ICD-10-CM | POA: Diagnosis not present

## 2015-03-19 DIAGNOSIS — I10 Essential (primary) hypertension: Secondary | ICD-10-CM | POA: Diagnosis not present

## 2015-03-19 DIAGNOSIS — K5900 Constipation, unspecified: Secondary | ICD-10-CM

## 2015-03-19 DIAGNOSIS — Z23 Encounter for immunization: Secondary | ICD-10-CM | POA: Diagnosis not present

## 2015-03-19 DIAGNOSIS — I495 Sick sinus syndrome: Secondary | ICD-10-CM | POA: Diagnosis not present

## 2015-03-19 MED ORDER — GLIPIZIDE 5 MG PO TABS: 2.5000 mg | ORAL_TABLET | Freq: Every day | ORAL | Status: DC

## 2015-03-19 NOTE — Assessment & Plan Note (Signed)
BP Readings from Last 3 Encounters:  03/19/15 155/71  01/23/15 140/90  12/14/14 142/77   BP well controlled generally. Will continue current medications.

## 2015-03-19 NOTE — Assessment & Plan Note (Signed)
She is interested in stopping or changing Verapamil because of constipation on this medication. Encouraged her to follow up with her cardiologist to discuss options.

## 2015-03-19 NOTE — Assessment & Plan Note (Signed)
Chronic constipation. Discussed using Miralax 17gm bid. Follow up in 4 weeks and prn.

## 2015-03-19 NOTE — Progress Notes (Signed)
Pre visit review using our clinic review tool, if applicable. No additional management support is needed unless otherwise documented below in the visit note. 

## 2015-03-19 NOTE — Assessment & Plan Note (Signed)
Chronic left LE lymphedema. Discussed follow up with vascular surgery and use of compression pump. She will look into this.

## 2015-03-19 NOTE — Progress Notes (Signed)
Subjective:    Patient ID: Debra Saunders, female    DOB: July 08, 1945, 69 y.o.   MRN: [id number]  HPI  69YO female presents for follow up.  DM - BG fasting near 135 mostly. After meals, near 240s-270s. Would like to get off insulin. Frustrated by taking insulin injection. Daughter has been giving it to her in her arm. She refuses to have shot in abdomen.  She continues to feel fatigued. Frustrated by chronic swelling in left lower leg. Has not recently been using compression pump. Also frustrated by constipation. Taking Miralax 17gm daily with her coffee, however continues to have hard intermittent BMs. Would like to stop Verapamil as she thinks this medication is causing symptoms.  Wt Readings from Last 3 Encounters:  03/19/15 240 lb (108.863 kg)  01/23/15 238 lb (107.956 kg)  12/14/14 240 lb 14.4 oz (109.272 kg)   BP Readings from Last 3 Encounters:  03/19/15 155/71  01/23/15 140/90  12/14/14 142/77    Past Medical History  Diagnosis Date  . Thyroid disorder   . Diabetes mellitus     type II  . Thyroid nodule   . Sleep apnea   . Obesity   . Hypertension   . Hyperlipidemia   . Urinary incontinence   . CHF (congestive heart failure) 2005    Dx at [facility]  . Chronic back pain   . COPD (chronic obstructive pulmonary disease)     2L [place]  . Sleep apnea   . Lipoma of back   . Arthritis   . Streptococcal bacteremia   . Acute renal failure   . Stroke   . Sepsis with metabolic encephalopathy   . Gastritis   . Symptomatic bradycardia    Family History  Problem Relation Age of [place]  . Heart disease Mother   . Hypertension Mother   . COPD Mother     was a smoker  . Thyroid disease Mother   . Hypertension Father   . Hypertension Sister   . Thyroid disease Sister   . Hypertension Sister   . Thyroid disease Sister   . Breast cancer Maternal Aunt    Past Surgical History  Procedure Laterality Date  . Cholecystectomy    . Vaginal hysterectomy    . Cardiac  catheterization  2013    @ [place]: No obstructive CAD: Only 20% ostial left circumflex.  . Abdominal hysterectomy  1969  . Cyst removal trunk  2002    BACK  . Lipoma removal  2014    back  . Pacemaker insertion  2015    [place] dual chamber pacemaker implanted by Dr Caryl Comes for symptomatic bradycardia  . Insert / replace / remove pacemaker    . Permanent pacemaker insertion N/A 01/09/2014    Procedure: PERMANENT PACEMAKER INSERTION;  Surgeon: Deboraha Sprang, MD;  Location: [facility] CATH LAB;  Service: Cardiovascular;  Laterality: N/A;   Social History   Social History  . Marital Status: Divorced    Spouse Name: N/A  . Number of Children: N/A  . Years of Education: N/A   Occupational History  . Retired- factory work     exposed to [employer]  . Smoking status: Former Smoker -- 0.30 packs/day for 2 years    Types: Cigarettes    Quit date: 06/23/1990  . Smokeless tobacco: Never Used  . Alcohol Use: No  . Drug Use: No  . Sexual Activity: Not Asked   Other Topics  Concern  . None   Social History Narrative   Lives in [place] with daughter. Has 4 children. No pets.      Work - Restaurant manager, fast food, retired      Diet - regular    Review of Systems  Constitutional: Positive for fatigue. Negative for fever, chills, appetite change and unexpected weight change.  Eyes: Negative for visual disturbance.  Respiratory: Positive for shortness of breath. Negative for cough.   Cardiovascular: Positive for leg swelling. Negative for chest pain and palpitations.  Gastrointestinal: Negative for nausea, vomiting, abdominal pain, diarrhea and constipation.  Musculoskeletal: Positive for myalgias and arthralgias.  Skin: Negative for color change and rash.  Hematological: Negative for adenopathy. Does not bruise/bleed easily.  Psychiatric/Behavioral: Negative for dysphoric mood. The patient is not nervous/anxious.        Objective:    BP 155/71 mmHg  Pulse  51  Temp(Src) 98.5 F (36.9 C) (Oral)  Ht 5\' 1"  (1.549 m)  Wt 240 lb (108.863 kg)  BMI 45.37 kg/m2  SpO2 98% Physical Exam  Constitutional: She is oriented to person, place, and time. She appears well-developed and well-nourished. No distress.  HENT:  Head: Normocephalic and atraumatic.  Right Ear: External ear normal.  Left Ear: External ear normal.  Nose: Nose normal.  Mouth/Throat: Oropharynx is clear and moist. No oropharyngeal exudate.  Eyes: Conjunctivae are normal. Pupils are equal, round, and reactive to light. Right eye exhibits no discharge. Left eye exhibits no discharge. No scleral icterus.  Neck: Normal range of motion. Neck supple. No tracheal deviation present. No thyromegaly present.  Cardiovascular: Normal rate, regular rhythm, normal heart sounds and intact distal pulses.  Exam reveals no gallop and no friction rub.   No murmur heard. Pulmonary/Chest: Effort normal and breath sounds normal. No respiratory distress. She has no wheezes. She has no rales. She exhibits no tenderness.  Musculoskeletal: Normal range of motion. She exhibits edema (non-pitting edema LLE). She exhibits no tenderness.  Lymphadenopathy:    She has no cervical adenopathy.  Neurological: She is alert and oriented to person, place, and time. No cranial nerve deficit. She exhibits normal muscle tone. Coordination normal.  Skin: Skin is warm and dry. No rash noted. She is not diaphoretic. No erythema. No pallor.  Psychiatric: She has a normal mood and affect. Her behavior is normal. Judgment and thought content normal.          Assessment & Plan:   Problem List Items Addressed This Visit      Unprioritized   Constipation    Chronic constipation. Discussed using Miralax 17gm bid. Follow up in 4 weeks and prn.      Diabetes mellitus type 2, controlled - Primary    BG continue to be elevated. Recommended adding low dose of Novolog with meals, but she declines another injection. Will continue  Levemir and add low dose of Glipizide. Follow up in 4 weeks. Check A1c with labs today.      Relevant Medications   glipiZIDE (GLUCOTROL) 5 MG tablet   Other Relevant Orders   Comprehensive metabolic panel   Hemoglobin A1c   Lipid panel   Microalbumin / creatinine urine ratio   Hypertension    BP Readings from Last 3 Encounters:  03/19/15 155/71  01/23/15 140/90  12/14/14 142/77   BP well controlled generally. Will continue current medications.      Lymphedema    Chronic left LE lymphedema. Discussed follow up with vascular surgery and use of compression pump. She  will look into this.      Sinoatrial node dysfunction    She is interested in stopping or changing Verapamil because of constipation on this medication. Encouraged her to follow up with her cardiologist to discuss options.          Return in about 3 months (around 06/18/2015) for Recheck of Diabetes.

## 2015-03-19 NOTE — Assessment & Plan Note (Signed)
BG continue to be elevated. Recommended adding low dose of Novolog with meals, but she declines another injection. Will continue Levemir and add low dose of Glipizide. Follow up in 4 weeks. Check A1c with labs today.

## 2015-03-19 NOTE — Patient Instructions (Addendum)
Labs today.  Start Glipizide 2.5mg  daily with breakfast.  Follow up in 3 months or sooner as needed.

## 2015-03-20 LAB — COMPREHENSIVE METABOLIC PANEL
ALT: 48 U/L — ABNORMAL HIGH (ref 0–35)
AST: 58 U/L — ABNORMAL HIGH (ref 0–37)
Albumin: 3.7 g/dL (ref 3.5–5.2)
Alkaline Phosphatase: 116 U/L (ref 39–117)
BUN: 20 mg/dL (ref 6–23)
CO2: 28 mEq/L (ref 19–32)
Calcium: 9.7 mg/dL (ref 8.4–10.5)
Chloride: 101 mEq/L (ref 96–112)
Creatinine, Ser: 0.83 mg/dL (ref 0.40–1.20)
GFR: 86.97 mL/min (ref >60.00)
Glucose, Bld: 130 mg/dL — ABNORMAL HIGH (ref 70–99)
Potassium: 3.7 mEq/L (ref 3.5–5.1)
Sodium: 139 mEq/L (ref 135–145)
Total Bilirubin: 0.3 mg/dL (ref 0.2–1.2)
Total Protein: 7.9 g/dL (ref 6.0–8.3)

## 2015-03-20 LAB — LIPID PANEL
Cholesterol: 155 mg/dL (ref 0–200)
HDL: 51 mg/dL (ref >39.00)
LDL Cholesterol: 75 mg/dL (ref 0–99)
NonHDL: 104.18
Total CHOL/HDL Ratio: 3
Triglycerides: 146 mg/dL (ref 0.0–149.0)
VLDL: 29.2 mg/dL (ref 0.0–40.0)

## 2015-03-20 LAB — HEMOGLOBIN A1C: Hgb A1c MFr Bld: 6.7 % — ABNORMAL HIGH (ref 4.6–6.5)

## 2015-03-20 LAB — MICROALBUMIN / CREATININE URINE RATIO
Creatinine,U: 104.4 mg/dL
Microalb Creat Ratio: 0.7 mg/g (ref 0.0–30.0)
Microalb, Ur: <0.7 mg/dL (ref 0.0–1.9)

## 2015-03-22 DIAGNOSIS — G4733 Obstructive sleep apnea (adult) (pediatric): Secondary | ICD-10-CM | POA: Diagnosis not present

## 2015-03-24 DIAGNOSIS — M542 Cervicalgia: Secondary | ICD-10-CM | POA: Diagnosis not present

## 2015-03-25 ENCOUNTER — Encounter: Payer: Self-pay | Admitting: Emergency Medicine

## 2015-03-25 ENCOUNTER — Emergency Department
Admission: EM | Admit: 2015-03-25 | Discharge: 2015-03-25 | Disposition: A | Discharge status: Home / Self Care | Payer: Commercial Managed Care - HMO | Attending: Emergency Medicine | Admitting: Emergency Medicine

## 2015-03-25 ENCOUNTER — Emergency Department: Payer: Commercial Managed Care - HMO

## 2015-03-25 DIAGNOSIS — I10 Essential (primary) hypertension: Secondary | ICD-10-CM | POA: Diagnosis not present

## 2015-03-25 DIAGNOSIS — G8929 Other chronic pain: Secondary | ICD-10-CM | POA: Diagnosis not present

## 2015-03-25 DIAGNOSIS — M542 Cervicalgia: Secondary | ICD-10-CM | POA: Diagnosis not present

## 2015-03-25 DIAGNOSIS — Z794 Long term (current) use of insulin: Secondary | ICD-10-CM | POA: Insufficient documentation

## 2015-03-25 DIAGNOSIS — Z87891 Personal history of nicotine dependence: Secondary | ICD-10-CM | POA: Diagnosis not present

## 2015-03-25 DIAGNOSIS — Z79899 Other long term (current) drug therapy: Secondary | ICD-10-CM | POA: Diagnosis not present

## 2015-03-25 DIAGNOSIS — M5412 Radiculopathy, cervical region: Secondary | ICD-10-CM | POA: Diagnosis not present

## 2015-03-25 DIAGNOSIS — Z880 Allergy status to penicillin: Secondary | ICD-10-CM | POA: Insufficient documentation

## 2015-03-25 DIAGNOSIS — E119 Type 2 diabetes mellitus without complications: Secondary | ICD-10-CM | POA: Diagnosis not present

## 2015-03-25 MED ORDER — DIAZEPAM 5 MG PO TABS: ORAL_TABLET | ORAL | Status: DC

## 2015-03-25 MED ORDER — OXYCODONE-ACETAMINOPHEN 5-325 MG PO TABS
2.0000 | ORAL_TABLET | Freq: Once | ORAL | Status: AC
  Administered 2015-03-25: 2 via ORAL
  Filled 2015-03-25: qty 2

## 2015-03-25 MED ORDER — PROMETHAZINE HCL 25 MG PO TABS
25.0000 mg | ORAL_TABLET | Freq: Once | ORAL | Status: AC
  Administered 2015-03-25: 25 mg via ORAL
  Filled 2015-03-25: qty 1

## 2015-03-25 MED ORDER — DIAZEPAM 5 MG PO TABS
5.0000 mg | ORAL_TABLET | Freq: Once | ORAL | Status: AC
  Administered 2015-03-25: 5 mg via ORAL
  Filled 2015-03-25: qty 1

## 2015-03-25 NOTE — ED Notes (Signed)
MD at bedside for eval.

## 2015-03-25 NOTE — ED Provider Notes (Signed)
[facility] Emergency Department Provider Note  ____________________________________________  Time seen: Approximately 3:41 AM  I have reviewed the triage vital signs and the nursing notes.   HISTORY  Chief Complaint Neck Pain    HPI Debra Saunders is a 69 y.o. female with an extensive history that includeschronic back pain, diabetes, obesity, hypertension, CHF, COPD, and a history of CVA who presents with approximately 24 hours of gradually worsening neck pain that radiates down her right arm.  She reports that when she awoke yesterday morning she felt the pain and that he got worse over the course of the day.  She describes it as sharp and it worsens with certain movements of her head or positions of her neck.  The radiation down her right arm is intermittent and it is not accompanied with any weakness.  She states that when her daughter rubs her neck in certain ways it makes it feel better but otherwise it makes it feel worse.  She has not had any traumatic injury of which she is aware.  She feels like it is radiating sometimes to her chest and back as well, but she does have chronic chest wall and back pain after an episode several years ago where she coded and required CPR.  She denies nausea/vomiting, shortness of breath, abdominal pain, and dysuria.   Past Medical History  Diagnosis Date  . Thyroid disorder   . Diabetes mellitus ([place])     type II  . Thyroid nodule   . Sleep apnea   . Obesity   . Hypertension   . Hyperlipidemia   . Urinary incontinence   . CHF (congestive heart failure) ([place]) 2005    Dx at [facility]  . Chronic back pain   . COPD (chronic obstructive pulmonary disease) ([place])     2L [place]  . Sleep apnea   . Lipoma of back   . Arthritis   . Streptococcal bacteremia   . Acute renal failure ([place])   . Stroke ([place])   . Sepsis with metabolic encephalopathy ([place])   . Gastritis   . Symptomatic bradycardia     Patient Active Problem List    Diagnosis Date Noted  . Dysphagia, pharyngoesophageal phase 10/03/2014  . Dizziness and giddiness 09/01/2014  . Costochondritis 07/04/2014  . Elevated LFTs 06/29/2014  . Dyspnea 06/22/2014  . 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 Abnormality of gait 01/13/2014  . Sinoatrial node dysfunction ([place]) 01/09/2014  . Allergic rhinitis 10/26/2013  . Lymphedema 08/03/2013  . Constipation 04/12/2013  . Pulmonary embolus ([place]) 01/19/2013  . Subarachnoid hemorrhage ([place]) 01/19/2013  . Musculoskeletal chest pain 01/19/2013  . Convulsions/seizures ([place]) 01/19/2013  . Other convulsions 01/19/2013  . Preop cardiovascular exam 10/19/2012  . Left knee pain 10/06/2012  . Fatigue 09/27/2012  . Sinus bradycardia 08/10/2012  . Anemia 04/28/2012  . Chronic respiratory failure with hypoxia ([place]) 04/26/2012  . Diabetes mellitus type 2, controlled ([place]) 03/30/2012  . Pulmonary hypertension ([place]) 03/30/2012  . Hyperlipidemia 03/30/2012  . Screening for breast cancer 03/30/2012  . Hypothyroidism 03/30/2012  . Chronic diastolic heart failure ([place]) 01/22/2012  . Hypertension     Past Surgical History  Procedure Laterality Date  . Cholecystectomy    . Vaginal hysterectomy    . Cardiac catheterization  2013    @ [place]: No obstructive CAD: Only 20% ostial left circumflex.  . Abdominal hysterectomy  1969  . Cyst removal trunk  2002    BACK  . Lipoma removal  2014    back  . Pacemaker insertion  2015    [place] dual chamber pacemaker implanted by Dr Caryl Comes for symptomatic bradycardia  . Insert / replace / remove pacemaker    . Permanent pacemaker insertion N/A 01/09/2014    Procedure: PERMANENT PACEMAKER INSERTION;  Surgeon: Deboraha Sprang, MD;  Location: [facility] CATH LAB;  Service: Cardiovascular;  Laterality: N/A;    Current Outpatient Rx  Name  Route  Sig  Dispense  Refill  . acetaminophen (TYLENOL) 325 MG tablet   Oral   Take 325 mg by mouth every 6 (six) hours as needed for  pain.         [name] ambrisentan (LETAIRIS) 5 MG tablet   Oral   Take 1 tablet (5 mg total) by mouth daily.   30 tablet   5   . antiseptic oral rinse (BIOTENE) LIQD   Mouth Rinse   15 mLs by Mouth Rinse route as needed for dry mouth.         [name] glipiZIDE (GLUCOTROL) 5 MG tablet   Oral   Take 0.5 tablets (2.5 mg total) by mouth daily before breakfast.   60 tablet   3   . insulin detemir (LEVEMIR) 100 UNIT/ML injection   Subcutaneous   Inject 0.4 mLs (40 Units total) into the skin at bedtime.   10 mL   5   . Insulin Syringe-Needle U-100 (INSULIN SYRINGE 1CC/28G) 28G X 1/2" 1 ML MISC      Use as directed with levemir insulin. Dx: E11.9   100 each   5   . meclizine (ANTIVERT) 25 MG tablet      TAKE TWO TABLETS (50 MG TOTAL) BY MOUTH TWICE DAILY AS NEEDED FOR DIZZINESS   60 tablet   0   . Menthol, Topical Analgesic, (BIOFREEZE EX)   Apply externally   Apply 1 each topically as needed (for pain).         [name] NAPROXEN DR 500 MG EC tablet      TAKE ONE TABLET BY MOUTH TWICE DAILY AS NEEDED   60 tablet   0   . NON FORMULARY      Oxygen 3 liters daily.         . NON FORMULARY      CPAP         . ondansetron (ZOFRAN) 8 MG tablet   Oral   Take 1 tablet (8 mg total) by mouth every 8 (eight) hours as needed for nausea or vomiting.   30 tablet   2   . oxyCODONE-acetaminophen (PERCOCET/ROXICET) 5-325 MG per tablet   Oral   Take 0.5 tablets by mouth every 8 (eight) hours as needed for severe pain.         . pantoprazole (PROTONIX) 40 MG tablet   Oral   Take 1 tablet (40 mg total) by mouth daily.   30 tablet   3   . polyethylene glycol (MIRALAX / GLYCOLAX) packet   Oral   Take 17 g by mouth daily as needed for moderate constipation.         . potassium chloride SA (KLOR-CON M20) 20 MEQ tablet   Oral   Take 1 tablet (20 mEq total) by mouth 2 (two) times daily.   60 tablet   6   . promethazine (PHENERGAN) 25 MG tablet   Oral   Take 25 mg by mouth  every 4 (four) hours as needed for nausea.         [name]  Simethicone (GAS-X PO)   Oral   Take 1 tablet by mouth as needed (for gas).          . torsemide (DEMADEX) 20 MG tablet   Oral   Take 1 tablet (20 mg total) by mouth 2 (two) times daily.   60 tablet   6   . verapamil (CALAN-SR) 240 MG CR tablet      TAKE ONE TABLET BY MOUTH ONCE DAILY   30 tablet   0   . diazepam (VALIUM) 5 MG tablet      Take 1 tablet PO every 8 hours as needed for neck pain/muscle spasms   10 tablet   0     Allergies Aspirin; Other; and Penicillins  Family History  Problem Relation Age of Onset  . Heart disease Mother   . Hypertension Mother   . COPD Mother     was a smoker  . Thyroid disease Mother   . Hypertension Father   . Hypertension Sister   . Thyroid disease Sister   . Hypertension Sister   . Thyroid disease Sister   . Breast cancer Maternal Aunt     Social History Social History  Substance Use Topics  . Smoking status: Former Smoker -- 0.30 packs/day for 2 years    Types: Cigarettes    Quit date: 06/23/1990  . Smokeless tobacco: Never Used  . Alcohol Use: No    Review of Systems Constitutional: No fever/chills Eyes: No visual changes. ENT: No sore throat.  Severe pain in the back of her neck radiating down the right arm Cardiovascular: Denies chest pain. Respiratory: Denies shortness of breath. Gastrointestinal: No abdominal pain.  No nausea, no vomiting.  No diarrhea.  No constipation. Genitourinary: Negative for dysuria. Musculoskeletal: Negative for back pain. Skin: Negative for rash. Neurological: Negative for headaches, focal weakness or numbness.  10-point ROS otherwise negative.  ____________________________________________   PHYSICAL EXAM:  VITAL SIGNS: ED Triage Vitals  Enc Vitals Group     BP 03/25/15 0110 149/84 mmHg     Pulse Rate 03/25/15 0110 87     Resp 03/25/15 0110 20     Temp 03/25/15 0110 98.1 F (36.7 C)     Temp Source 03/25/15 0110  Oral     SpO2 03/25/15 0110 97 %     Weight 03/25/15 0110 240 lb (108.863 kg)     Height 03/25/15 0110 5\' 1"  (1.549 m)     Head Cir --      Peak Flow --      Pain Score 03/25/15 0112 10     Pain Loc --      Pain Edu? --      Excl. in [place]? --     Constitutional: Alert and oriented.  Moaning from time to time and writhing around in bed as she cannot comfortable. Eyes: Conjunctivae are normal. PERRL. EOMI. Head: Atraumatic. Nose: No congestion/rhinnorhea. Mouth/Throat: Mucous membranes are moist.  Oropharynx non-erythematous. Neck: No stridor.  No carotid bruits or pulsatile masses.  The slightest touch to the patient's soft tissue and skin elicits a dramatic response of pain and discomfort, but with some distraction it seems that she has mild to moderate tenderness to palpation of the soft tissue as well as of the cervical spine, more noticeable on the right side of her neck. Cardiovascular: Normal rate, regular rhythm. Grossly normal heart sounds.  Good peripheral circulation. Respiratory: Normal respiratory effort.  No retractions. Lungs CTAB. Gastrointestinal: Soft and nontender. No distention.  No abdominal bruits. No CVA tenderness. Musculoskeletal: No lower extremity tenderness nor edema.  No joint effusions. Neurologic:  Normal speech and language. No gross focal neurologic deficits are appreciated; the patient has normal grip strength and bilateral arm strength in all muscle groups.  Light touch sensation is intact bilaterally Skin:  Skin is warm, dry and intact. No rash noted.   ____________________________________________   LABS (all labs ordered are listed, but only abnormal results are displayed)  Labs Reviewed - No data to display ____________________________________________  EKG  Not indicated ____________________________________________  RADIOLOGY   Ct Cervical Spine Wo Contrast  03/25/2015   CLINICAL DATA:  Severe neck pain after waking up.  EXAM: CT CERVICAL  SPINE WITHOUT CONTRAST  TECHNIQUE: Multidetector CT imaging of the cervical spine was performed without intravenous contrast. Multiplanar CT image reconstructions were also generated.  COMPARISON:  None.  FINDINGS: No acute fracture, endplate erosion, or focal bone lesion. No prevertebral edema.  Mild C6-7 anterolisthesis is associated with advanced facet arthropathy, the best explanation.  No indication of acute herniation and no notable canal stenosis. Degenerative disc disease is diffuse with endplate and uncovertebral ridging from C3-4 to C5-6. Ridging causes foraminal narrowing that is in general mild, except on the right at C3-4 where it is moderate to advanced.  Chronic interstitial opacity in the apical lungs, likely accentuated by expiratory phase. Tracheal flattening suggesting tracheomalacia.  Goiter.  IMPRESSION: 1. No acute finding. 2. Disc and facet degeneration as described above.   Electronically Signed   By: Monte Fantasia M.D.   On: 03/25/2015 05:18    ____________________________________________   PROCEDURES  Procedure(s) performed: None  Critical Care performed: No ____________________________________________   INITIAL IMPRESSION / ASSESSMENT AND PLAN / ED COURSE  Pertinent labs & imaging results that were available during my care of the patient were reviewed by me and considered in my medical decision making (see chart for details).  The patient's history, signs/symptoms, and physical exam all strongly suggest cervical radiculopathy.  She has no signs of infection and she does not have any meningismus.  I considered carotid dissection in the differential but her presentation is much more consistent with a soft tissue/radicular pain.  I will evaluate with a CT scan of her neck given that she does have some tenderness to palpation of her cervical spine, but I believe that the potential complications of obtaining a CTA of her neck outweighed the benefits.  I discussed all of  this with the patient and her family and they understand and agree.  I will give her a Valium 5 mg by mouth in addition to the Percocet that she received previously given that she is not nave to narcotics and that the Valium may help with muscle spasms.  I considered giving her prednisone for the cervical radiculopathy but I am concerned that this will make it very difficult for her to control her blood sugars.  I will discuss this with the patient and their family when I returned to the room with the CT scan results.  ----------------------------------------- 6:02 AM on 03/25/2015 -----------------------------------------  No acute findings on CT scan.  Patient was sleeping comfortably when I entered the room.  After awakening she states that she is still having some symptoms but she feels better than before.  She continues to not have any weakness in her extremities.  I discussed with the patient and her family the probability of cervical radiculopathy.  We agreed together that we would not pursue prednisone given  the difficulty that would cause her and managing her blood sugar.  She will follow up with primary care doctor at the next available opportunity.  ____________________________________________  FINAL CLINICAL IMPRESSION(S) / ED DIAGNOSES  Final diagnoses:  Cervical radiculopathy      NEW MEDICATIONS STARTED DURING THIS VISIT:  New Prescriptions   DIAZEPAM (VALIUM) 5 MG TABLET    Take 1 tablet PO every 8 hours as needed for neck pain/muscle spasms     Hinda Kehr, MD 03/25/15 [phone]

## 2015-03-25 NOTE — ED Notes (Signed)
Pt incontinent of urine. Pt changed and clean, dry incontinence pad applied. Warm blanket provided to patient. Family remains at bedside. Will continue to monitor.

## 2015-03-25 NOTE — ED Notes (Signed)
Pt arrives via EMS from home. Pt c/o neck pain when she woke up this morning. Pt states, "I was sleeping and woke up with my neck hurting and now it hurts to the middle of my back." Denies injury. Pt AAOX4. NAD noted. RR even and nonlabored. Pt moaning in pain at this time. Pt wears O2 at 3L/min at home. Pt placed on [place] at 3L/min. Pt's O2 sats now 97%.

## 2015-03-25 NOTE — ED Notes (Signed)
MD at bedside for reeval

## 2015-03-25 NOTE — Discharge Instructions (Signed)
As we discussed, we believe that the pain you are experiencing as a result of what is called cervical radiculopathy.  Please read through the information below.  We discussed starting prednisone, but given the concern that it will make her blood sugar very difficult to manage, we decided to hold off at this time.  Please follow-up with your regular doctor at the next available opportunity to discuss additional workup and evaluation.  Return to the emergency department with new or worsening symptoms that concern you.   Cervical Radiculopathy Cervical radiculopathy happens when a nerve in the neck is pinched or bruised by a slipped (herniated) disk or by arthritic changes in the bones of the cervical spine. This can occur due to an injury or as part of the normal aging process. Pressure on the cervical nerves can cause pain or numbness that runs from your neck all the way down into your arm and fingers. CAUSES  There are many possible causes, including:  Injury.  Muscle tightness in the neck from overuse.  Swollen, painful joints (arthritis).  Breakdown or degeneration in the bones and joints of the spine (spondylosis) due to aging.  Bone spurs that may develop near the cervical nerves. SYMPTOMS  Symptoms include pain, weakness, or numbness in the affected arm and hand. Pain can be severe or irritating. Symptoms may be worse when extending or turning the neck. DIAGNOSIS  Your caregiver will ask about your symptoms and do a physical exam. He or she may test your strength and reflexes. X-rays, CT scans, and MRI scans may be needed in cases of injury or if the symptoms do not go away after a period of time. Electromyography (EMG) or nerve conduction testing may be done to study how your nerves and muscles are working. TREATMENT  Your caregiver may recommend certain exercises to help relieve your symptoms. Cervical radiculopathy can, and often does, get better with time and treatment. If your  problems continue, treatment options may include:  Wearing a soft collar for short periods of time.  Physical therapy to strengthen the neck muscles.  Medicines, such as nonsteroidal anti-inflammatory drugs (NSAIDs), oral corticosteroids, or spinal injections.  Surgery. Different types of surgery may be done depending on the cause of your problems. HOME CARE INSTRUCTIONS   Put ice on the affected area.  Put ice in a plastic bag.  Place a towel between your skin and the bag.  Leave the ice on for 15-20 minutes, 03-04 times a day or as directed by your caregiver.  If ice does not help, you can try using heat. Take a warm shower or bath, or use a hot water bottle as directed by your caregiver.  You may try a gentle neck and shoulder massage.  Use a flat pillow when you sleep.  Only take over-the-counter or prescription medicines for pain, discomfort, or fever as directed by your caregiver.  If physical therapy was prescribed, follow your caregiver's directions.  If a soft collar was prescribed, use it as directed. SEEK IMMEDIATE MEDICAL CARE IF:   Your pain gets much worse and cannot be controlled with medicines.  You have weakness or numbness in your hand, arm, face, or leg.  You have a high fever or a stiff, rigid neck.  You lose bowel or bladder control (incontinence).  You have trouble with walking, balance, or speaking. MAKE SURE YOU:   Understand these instructions.  Will watch your condition.  Will get help right away if you are not doing well  or get worse. Document Released: 03/04/2001 Document Revised: 09/01/2011 Document Reviewed: 01/21/2011 [facility] Patient Information 2015 [place], [place]. This information is not intended to replace advice given to you by your health care provider. Make sure you discuss any questions you have with your health care provider.

## 2015-03-25 NOTE — ED Notes (Signed)
Patient transported to CT via stretcher.

## 2015-03-27 ENCOUNTER — Emergency Department: Payer: Commercial Managed Care - HMO

## 2015-03-27 ENCOUNTER — Observation Stay
Admission: EM | Admit: 2015-03-27 | Discharge: 2015-03-28 | Disposition: A | Discharge status: Home / Self Care | Payer: Commercial Managed Care - HMO | Attending: Internal Medicine | Admitting: Internal Medicine

## 2015-03-27 ENCOUNTER — Encounter: Payer: Self-pay | Admitting: Intensive Care

## 2015-03-27 DIAGNOSIS — I5032 Chronic diastolic (congestive) heart failure: Secondary | ICD-10-CM | POA: Diagnosis not present

## 2015-03-27 DIAGNOSIS — R000 Tachycardia, unspecified: Secondary | ICD-10-CM | POA: Insufficient documentation

## 2015-03-27 DIAGNOSIS — M1990 Unspecified osteoarthritis, unspecified site: Secondary | ICD-10-CM | POA: Insufficient documentation

## 2015-03-27 DIAGNOSIS — R7989 Other specified abnormal findings of blood chemistry: Secondary | ICD-10-CM | POA: Diagnosis not present

## 2015-03-27 DIAGNOSIS — Z794 Long term (current) use of insulin: Secondary | ICD-10-CM | POA: Insufficient documentation

## 2015-03-27 DIAGNOSIS — R079 Chest pain, unspecified: Principal | ICD-10-CM

## 2015-03-27 DIAGNOSIS — M940 Chondrocostal junction syndrome [Tietze]: Secondary | ICD-10-CM | POA: Diagnosis not present

## 2015-03-27 DIAGNOSIS — I110 Hypertensive heart disease with heart failure: Secondary | ICD-10-CM | POA: Diagnosis not present

## 2015-03-27 DIAGNOSIS — Z79899 Other long term (current) drug therapy: Secondary | ICD-10-CM | POA: Diagnosis not present

## 2015-03-27 DIAGNOSIS — Z87891 Personal history of nicotine dependence: Secondary | ICD-10-CM | POA: Insufficient documentation

## 2015-03-27 DIAGNOSIS — G8929 Other chronic pain: Secondary | ICD-10-CM | POA: Insufficient documentation

## 2015-03-27 DIAGNOSIS — J449 Chronic obstructive pulmonary disease, unspecified: Secondary | ICD-10-CM | POA: Insufficient documentation

## 2015-03-27 DIAGNOSIS — I493 Ventricular premature depolarization: Secondary | ICD-10-CM | POA: Diagnosis not present

## 2015-03-27 DIAGNOSIS — M549 Dorsalgia, unspecified: Secondary | ICD-10-CM | POA: Diagnosis not present

## 2015-03-27 DIAGNOSIS — R52 Pain, unspecified: Secondary | ICD-10-CM | POA: Diagnosis present

## 2015-03-27 DIAGNOSIS — I272 Other secondary pulmonary hypertension: Secondary | ICD-10-CM | POA: Diagnosis not present

## 2015-03-27 DIAGNOSIS — I708 Atherosclerosis of other arteries: Secondary | ICD-10-CM | POA: Diagnosis not present

## 2015-03-27 DIAGNOSIS — Z886 Allergy status to analgesic agent status: Secondary | ICD-10-CM | POA: Insufficient documentation

## 2015-03-27 DIAGNOSIS — M7989 Other specified soft tissue disorders: Secondary | ICD-10-CM | POA: Insufficient documentation

## 2015-03-27 DIAGNOSIS — Z880 Allergy status to penicillin: Secondary | ICD-10-CM | POA: Diagnosis not present

## 2015-03-27 DIAGNOSIS — Z888 Allergy status to other drugs, medicaments and biological substances status: Secondary | ICD-10-CM | POA: Diagnosis not present

## 2015-03-27 DIAGNOSIS — Z9049 Acquired absence of other specified parts of digestive tract: Secondary | ICD-10-CM | POA: Insufficient documentation

## 2015-03-27 DIAGNOSIS — M542 Cervicalgia: Secondary | ICD-10-CM | POA: Diagnosis not present

## 2015-03-27 DIAGNOSIS — R42 Dizziness and giddiness: Secondary | ICD-10-CM | POA: Diagnosis not present

## 2015-03-27 DIAGNOSIS — G4730 Sleep apnea, unspecified: Secondary | ICD-10-CM | POA: Diagnosis not present

## 2015-03-27 DIAGNOSIS — Z79891 Long term (current) use of opiate analgesic: Secondary | ICD-10-CM | POA: Diagnosis not present

## 2015-03-27 DIAGNOSIS — Z9981 Dependence on supplemental oxygen: Secondary | ICD-10-CM | POA: Insufficient documentation

## 2015-03-27 DIAGNOSIS — R0602 Shortness of breath: Secondary | ICD-10-CM | POA: Diagnosis not present

## 2015-03-27 DIAGNOSIS — E119 Type 2 diabetes mellitus without complications: Secondary | ICD-10-CM | POA: Insufficient documentation

## 2015-03-27 DIAGNOSIS — E079 Disorder of thyroid, unspecified: Secondary | ICD-10-CM | POA: Diagnosis not present

## 2015-03-27 DIAGNOSIS — D71 Functional disorders of polymorphonuclear neutrophils: Secondary | ICD-10-CM | POA: Insufficient documentation

## 2015-03-27 DIAGNOSIS — Z791 Long term (current) use of non-steroidal anti-inflammatories (NSAID): Secondary | ICD-10-CM | POA: Insufficient documentation

## 2015-03-27 DIAGNOSIS — Z90710 Acquired absence of both cervix and uterus: Secondary | ICD-10-CM | POA: Diagnosis not present

## 2015-03-27 DIAGNOSIS — R918 Other nonspecific abnormal finding of lung field: Secondary | ICD-10-CM | POA: Insufficient documentation

## 2015-03-27 DIAGNOSIS — Z86711 Personal history of pulmonary embolism: Secondary | ICD-10-CM | POA: Insufficient documentation

## 2015-03-27 DIAGNOSIS — Z950 Presence of cardiac pacemaker: Secondary | ICD-10-CM | POA: Diagnosis not present

## 2015-03-27 DIAGNOSIS — I6522 Occlusion and stenosis of left carotid artery: Secondary | ICD-10-CM | POA: Diagnosis not present

## 2015-03-27 DIAGNOSIS — R748 Abnormal levels of other serum enzymes: Secondary | ICD-10-CM | POA: Insufficient documentation

## 2015-03-27 DIAGNOSIS — J9611 Chronic respiratory failure with hypoxia: Secondary | ICD-10-CM | POA: Diagnosis not present

## 2015-03-27 DIAGNOSIS — E049 Nontoxic goiter, unspecified: Secondary | ICD-10-CM | POA: Insufficient documentation

## 2015-03-27 DIAGNOSIS — E669 Obesity, unspecified: Secondary | ICD-10-CM | POA: Insufficient documentation

## 2015-03-27 DIAGNOSIS — E785 Hyperlipidemia, unspecified: Secondary | ICD-10-CM | POA: Insufficient documentation

## 2015-03-27 DIAGNOSIS — Z8673 Personal history of transient ischemic attack (TIA), and cerebral infarction without residual deficits: Secondary | ICD-10-CM | POA: Diagnosis not present

## 2015-03-27 DIAGNOSIS — Z6841 Body Mass Index (BMI) 40.0 and over, adult: Secondary | ICD-10-CM | POA: Diagnosis not present

## 2015-03-27 DIAGNOSIS — I214 Non-ST elevation (NSTEMI) myocardial infarction: Secondary | ICD-10-CM

## 2015-03-27 HISTORY — DX: Chronic respiratory failure, unspecified whether with hypoxia or hypercapnia: J96.10

## 2015-03-27 LAB — CBC WITH DIFFERENTIAL/PLATELET
Basophils Absolute: 0.1 10*3/uL (ref 0–0.1)
Basophils Relative: 1 %
Eosinophils Absolute: 0 10*3/uL (ref 0–0.7)
Eosinophils Relative: 0 %
HCT: 37.4 % (ref 35.0–47.0)
Hemoglobin: 11.7 g/dL — ABNORMAL LOW (ref 12.0–16.0)
Lymphocytes Relative: 5 %
Lymphs Abs: 0.8 10*3/uL — ABNORMAL LOW (ref 1.0–3.6)
MCH: 26.1 pg (ref 26.0–34.0)
MCHC: 31.3 g/dL — ABNORMAL LOW (ref 32.0–36.0)
MCV: 83.3 fL (ref 80.0–100.0)
Monocytes Absolute: 0.8 10*3/uL (ref 0.2–0.9)
Monocytes Relative: 5 %
Neutro Abs: 14 10*3/uL — ABNORMAL HIGH (ref 1.4–6.5)
Neutrophils Relative %: 89 %
Platelets: 245 10*3/uL (ref 150–440)
RBC: 4.49 MIL/uL (ref 3.80–5.20)
RDW: 13.8 % (ref 11.5–14.5)
WBC: 15.7 10*3/uL — ABNORMAL HIGH (ref 3.6–11.0)

## 2015-03-27 LAB — BASIC METABOLIC PANEL
Anion gap: 11 (ref 5–15)
BUN: 9 mg/dL (ref 6–20)
CO2: 26 mmol/L (ref 22–32)
Calcium: 9.1 mg/dL (ref 8.9–10.3)
Chloride: 94 mmol/L — ABNORMAL LOW (ref 101–111)
Creatinine, Ser: 0.68 mg/dL (ref 0.44–1.00)
GFR calc Af Amer: >60 mL/min (ref >60)
GFR calc non Af Amer: >60 mL/min (ref >60)
Glucose, Bld: 186 mg/dL — ABNORMAL HIGH (ref 65–99)
Potassium: 3.7 mmol/L (ref 3.5–5.1)
Sodium: 131 mmol/L — ABNORMAL LOW (ref 135–145)

## 2015-03-27 LAB — GLUCOSE, CAPILLARY: Glucose-Capillary: 169 mg/dL — ABNORMAL HIGH (ref 65–99)

## 2015-03-27 LAB — TROPONIN I
Troponin I: 0.09 ng/mL — ABNORMAL HIGH (ref <0.031)
Troponin I: 0.06 ng/mL — ABNORMAL HIGH (ref <0.031)

## 2015-03-27 MED ORDER — METOPROLOL TARTRATE 25 MG PO TABS
12.5000 mg | ORAL_TABLET | Freq: Two times a day (BID) | ORAL | Status: DC
  Administered 2015-03-27 – 2015-03-28 (×2): 12.5 mg via ORAL
  Filled 2015-03-27 (×3): qty 1

## 2015-03-27 MED ORDER — ONDANSETRON HCL 4 MG/2ML IJ SOLN: 4.0000 mg | Freq: Four times a day (QID) | INTRAMUSCULAR | Status: DC | PRN

## 2015-03-27 MED ORDER — ASPIRIN 81 MG PO CHEW
324.0000 mg | CHEWABLE_TABLET | Freq: Once | ORAL | Status: AC
  Administered 2015-03-27: 324 mg via ORAL
  Filled 2015-03-27: qty 4

## 2015-03-27 MED ORDER — GLIPIZIDE 2.5 MG HALF TABLET
2.5000 mg | ORAL_TABLET | Freq: Every day | ORAL | Status: DC
  Filled 2015-03-27 (×2): qty 1

## 2015-03-27 MED ORDER — SODIUM CHLORIDE 0.9 % IJ SOLN: 3.0000 mL | Freq: Two times a day (BID) | INTRAMUSCULAR | Status: DC

## 2015-03-27 MED ORDER — CYCLOBENZAPRINE HCL 10 MG PO TABS
5.0000 mg | ORAL_TABLET | Freq: Three times a day (TID) | ORAL | Status: DC
  Administered 2015-03-27 – 2015-03-28 (×2): 5 mg via ORAL
  Filled 2015-03-27 (×2): qty 1

## 2015-03-27 MED ORDER — TORSEMIDE 20 MG PO TABS
20.0000 mg | ORAL_TABLET | Freq: Two times a day (BID) | ORAL | Status: DC
  Administered 2015-03-28: 20 mg via ORAL
  Filled 2015-03-27: qty 1

## 2015-03-27 MED ORDER — MENTHOL (TOPICAL ANALGESIC) 4 % EX GEL: CUTANEOUS | Status: DC | PRN

## 2015-03-27 MED ORDER — NAPROXEN 500 MG PO TABS
500.0000 mg | ORAL_TABLET | Freq: Two times a day (BID) | ORAL | Status: DC | PRN
  Administered 2015-03-28: 500 mg via ORAL
  Filled 2015-03-27: qty 1

## 2015-03-27 MED ORDER — HYDROMORPHONE HCL 1 MG/ML IJ SOLN
1.0000 mg | Freq: Once | INTRAMUSCULAR | Status: AC
  Administered 2015-03-27: 1 mg via INTRAVENOUS
  Filled 2015-03-27: qty 1

## 2015-03-27 MED ORDER — DIAZEPAM 5 MG PO TABS
5.0000 mg | ORAL_TABLET | Freq: Three times a day (TID) | ORAL | Status: DC | PRN
  Administered 2015-03-28: 5 mg via ORAL
  Filled 2015-03-27: qty 1

## 2015-03-27 MED ORDER — ALUM & MAG HYDROXIDE-SIMETH 200-200-20 MG/5ML PO SUSP: 30.0000 mL | Freq: Four times a day (QID) | ORAL | Status: DC | PRN

## 2015-03-27 MED ORDER — POLYETHYLENE GLYCOL 3350 17 G PO PACK: 17.0000 g | PACK | Freq: Every day | ORAL | Status: DC | PRN

## 2015-03-27 MED ORDER — SIMVASTATIN 10 MG PO TABS: 10.0000 mg | ORAL_TABLET | Freq: Every day | ORAL | Status: DC

## 2015-03-27 MED ORDER — SODIUM CHLORIDE 0.9 % IJ SOLN: 3.0000 mL | INTRAMUSCULAR | Status: DC | PRN

## 2015-03-27 MED ORDER — PROMETHAZINE HCL 25 MG PO TABS: 25.0000 mg | ORAL_TABLET | ORAL | Status: DC | PRN

## 2015-03-27 MED ORDER — ENOXAPARIN SODIUM 40 MG/0.4ML ~~LOC~~ SOLN
40.0000 mg | Freq: Two times a day (BID) | SUBCUTANEOUS | Status: DC
  Administered 2015-03-27: 40 mg via SUBCUTANEOUS
  Filled 2015-03-27: qty 0.4

## 2015-03-27 MED ORDER — FAMOTIDINE 20 MG PO TABS
20.0000 mg | ORAL_TABLET | Freq: Two times a day (BID) | ORAL | Status: DC
  Administered 2015-03-27 – 2015-03-28 (×2): 20 mg via ORAL
  Filled 2015-03-27 (×2): qty 1

## 2015-03-27 MED ORDER — INSULIN DETEMIR 100 UNIT/ML ~~LOC~~ SOLN
10.0000 [IU] | Freq: Every day | SUBCUTANEOUS | Status: DC
  Filled 2015-03-27 (×2): qty 0.1

## 2015-03-27 MED ORDER — ACETAMINOPHEN 325 MG PO TABS: 650.0000 mg | ORAL_TABLET | Freq: Four times a day (QID) | ORAL | Status: DC | PRN

## 2015-03-27 MED ORDER — POTASSIUM CHLORIDE CRYS ER 20 MEQ PO TBCR
20.0000 meq | EXTENDED_RELEASE_TABLET | Freq: Two times a day (BID) | ORAL | Status: DC
  Administered 2015-03-27 – 2015-03-28 (×2): 20 meq via ORAL
  Filled 2015-03-27 (×2): qty 1

## 2015-03-27 MED ORDER — ACETAMINOPHEN 325 MG PO TABS: 325.0000 mg | ORAL_TABLET | Freq: Four times a day (QID) | ORAL | Status: DC | PRN

## 2015-03-27 MED ORDER — IOHEXOL 350 MG/ML SOLN
125.0000 mL | Freq: Once | INTRAVENOUS | Status: AC | PRN
  Administered 2015-03-27: 125 mL via INTRAVENOUS

## 2015-03-27 MED ORDER — ONDANSETRON HCL 4 MG PO TABS: 8.0000 mg | ORAL_TABLET | Freq: Three times a day (TID) | ORAL | Status: DC | PRN

## 2015-03-27 MED ORDER — SODIUM CHLORIDE 0.9 % IV BOLUS (SEPSIS)
500.0000 mL | Freq: Once | INTRAVENOUS | Status: AC
  Administered 2015-03-27: 500 mL via INTRAVENOUS

## 2015-03-27 MED ORDER — INSULIN ASPART 100 UNIT/ML ~~LOC~~ SOLN: 0.0000 [IU] | Freq: Three times a day (TID) | SUBCUTANEOUS | Status: DC

## 2015-03-27 MED ORDER — AMBRISENTAN 5 MG PO TABS
5.0000 mg | ORAL_TABLET | Freq: Every day | ORAL | Status: DC
  Filled 2015-03-27: qty 1

## 2015-03-27 MED ORDER — MORPHINE SULFATE (PF) 4 MG/ML IV SOLN
4.0000 mg | Freq: Once | INTRAVENOUS | Status: AC
  Administered 2015-03-27: 4 mg via INTRAVENOUS
  Filled 2015-03-27: qty 1

## 2015-03-27 MED ORDER — ONDANSETRON HCL 4 MG/2ML IJ SOLN
4.0000 mg | Freq: Once | INTRAMUSCULAR | Status: AC
  Administered 2015-03-27: 4 mg via INTRAVENOUS
  Filled 2015-03-27: qty 2

## 2015-03-27 MED ORDER — ACETAMINOPHEN 650 MG RE SUPP: 650.0000 mg | Freq: Four times a day (QID) | RECTAL | Status: DC | PRN

## 2015-03-27 MED ORDER — INSULIN ASPART 100 UNIT/ML ~~LOC~~ SOLN: 0.0000 [IU] | Freq: Every day | SUBCUTANEOUS | Status: DC

## 2015-03-27 MED ORDER — MORPHINE SULFATE (PF) 2 MG/ML IV SOLN
2.0000 mg | INTRAVENOUS | Status: DC | PRN
  Administered 2015-03-27 – 2015-03-28 (×2): 2 mg via INTRAVENOUS
  Filled 2015-03-27 (×2): qty 1

## 2015-03-27 MED ORDER — VERAPAMIL HCL ER 240 MG PO TBCR
240.0000 mg | EXTENDED_RELEASE_TABLET | Freq: Every day | ORAL | Status: DC
  Administered 2015-03-28: 240 mg via ORAL
  Filled 2015-03-27: qty 1

## 2015-03-27 MED ORDER — ONDANSETRON HCL 4 MG PO TABS: 4.0000 mg | ORAL_TABLET | Freq: Four times a day (QID) | ORAL | Status: DC | PRN

## 2015-03-27 MED ORDER — SODIUM CHLORIDE 0.9 % IJ SOLN
3.0000 mL | Freq: Two times a day (BID) | INTRAMUSCULAR | Status: DC
  Administered 2015-03-27: 3 mL via INTRAVENOUS

## 2015-03-27 MED ORDER — MECLIZINE HCL 25 MG PO TABS: 25.0000 mg | ORAL_TABLET | Freq: Every day | ORAL | Status: DC | PRN

## 2015-03-27 MED ORDER — OXYCODONE-ACETAMINOPHEN 5-325 MG PO TABS
1.0000 | ORAL_TABLET | Freq: Three times a day (TID) | ORAL | Status: DC | PRN
  Administered 2015-03-27 – 2015-03-28 (×2): 1 via ORAL
  Filled 2015-03-27 (×2): qty 1

## 2015-03-27 MED ORDER — SODIUM CHLORIDE 0.9 % IV SOLN: 250.0000 mL | INTRAVENOUS | Status: DC | PRN

## 2015-03-27 MED ORDER — DOCUSATE SODIUM 100 MG PO CAPS
100.0000 mg | ORAL_CAPSULE | Freq: Two times a day (BID) | ORAL | Status: DC
  Administered 2015-03-27: 100 mg via ORAL
  Filled 2015-03-27 (×2): qty 1

## 2015-03-27 NOTE — ED Provider Notes (Addendum)
[facility] Emergency Department Provider Note  ____________________________________________  Time seen: Approximately 5:21 PM  I have reviewed the triage vital signs and the nursing notes.   HISTORY  Chief Complaint Neck Pain    HPI Debra Saunders is a 69 y.o. 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 Patient reports that she has had central squeezing chest pain intermittently since yesterday, gradual onset, currently severe. It is associated with some mild shortness of breath, radiates into both shoulders and down her right and left arm. She denies having this previously. She is also complaining of severe neck pain without trauma. She was seen here for the neck pain and 03/25/2015 with a contrasted CT neck which is reassuring. She was diagnosed with cervical radiculopathy without weakness however has been taking naproxen, oxycodone, diazepam without relief. Pain is worse with movement. She denies any recent infectious complaints. No vomiting, diarrhea, fevers or chills.   Past Medical History  Diagnosis Date  . Thyroid disorder   . Diabetes mellitus ([place])     type II  . Thyroid nodule   . Sleep apnea   . Obesity   . Hypertension   . Hyperlipidemia   . Urinary incontinence   . CHF (congestive heart failure) ([place]) 2005    Dx at [facility]  . Chronic back pain   . COPD (chronic obstructive pulmonary disease) ([place])     2L [place]  . Sleep apnea   . Lipoma of back   . Arthritis   . Streptococcal bacteremia   . Acute renal failure ([place])   . Stroke ([place])   . Sepsis with metabolic encephalopathy ([place])   . Gastritis   . Symptomatic bradycardia     Patient Active Problem List   Diagnosis Date Noted  . Dysphagia, pharyngoesophageal phase 10/03/2014  . Dizziness and giddiness  09/01/2014  . Costochondritis 07/04/2014  . Elevated LFTs 06/29/2014  . Dyspnea 06/22/2014  . 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 Abnormality of gait 01/13/2014  . Sinoatrial node dysfunction ([place]) 01/09/2014  . Allergic rhinitis 10/26/2013  . Lymphedema 08/03/2013  . Constipation 04/12/2013  . Pulmonary embolus ([place]) 01/19/2013  . Subarachnoid hemorrhage ([place]) 01/19/2013  . Musculoskeletal chest pain 01/19/2013  . Convulsions/seizures ([place]) 01/19/2013  . Other convulsions 01/19/2013  . Preop cardiovascular exam 10/19/2012  . Left knee pain 10/06/2012  . Fatigue 09/27/2012  . Sinus bradycardia 08/10/2012  . Anemia 04/28/2012  . Chronic respiratory failure with hypoxia ([place]) 04/26/2012  . Diabetes mellitus type 2, controlled ([place]) 03/30/2012  . Pulmonary hypertension ([place]) 03/30/2012  . Hyperlipidemia 03/30/2012  . Screening for breast cancer 03/30/2012  . Hypothyroidism 03/30/2012  . Chronic diastolic heart failure ([place]) 01/22/2012  . Hypertension     Past Surgical History  Procedure Laterality Date  . Cholecystectomy    . Vaginal hysterectomy    . Cardiac catheterization  2013    @ [facility]: No obstructive CAD: Only 20% ostial left circumflex.  . Abdominal hysterectomy  1969  . Cyst removal trunk  2002    BACK  . Lipoma removal  2014    back  . Pacemaker insertion  2015    [place] dual chamber pacemaker implanted by Dr Caryl Comes for symptomatic bradycardia  . Insert / replace / remove pacemaker    . Permanent pacemaker insertion N/A 01/09/2014  Procedure: PERMANENT PACEMAKER INSERTION;  Surgeon: Deboraha Sprang, MD;  Location: [facility] CATH LAB;  Service: Cardiovascular;  Laterality: N/A;    Current Outpatient Rx  Name  Route  Sig  Dispense  Refill  . acetaminophen (TYLENOL) 325 MG tablet   Oral   Take 325 mg by mouth every 6 (six) hours as needed for pain.         [name] ambrisentan (LETAIRIS) 5 MG tablet   Oral   Take 1 tablet (5 mg total) by mouth  daily.   30 tablet   5   . antiseptic oral rinse (BIOTENE) LIQD   Mouth Rinse   15 mLs by Mouth Rinse route as needed for dry mouth.         . diazepam (VALIUM) 5 MG tablet      Take 1 tablet PO every 8 hours as needed for neck pain/muscle spasms   10 tablet   0   . glipiZIDE (GLUCOTROL) 5 MG tablet   Oral   Take 0.5 tablets (2.5 mg total) by mouth daily before breakfast.   60 tablet   3   . insulin detemir (LEVEMIR) 100 UNIT/ML injection   Subcutaneous   Inject 0.4 mLs (40 Units total) into the skin at bedtime.   10 mL   5   . Insulin Syringe-Needle U-100 (INSULIN SYRINGE 1CC/28G) 28G X 1/2" 1 ML MISC      Use as directed with levemir insulin. Dx: E11.9   100 each   5   . meclizine (ANTIVERT) 25 MG tablet      TAKE TWO TABLETS (50 MG TOTAL) BY MOUTH TWICE DAILY AS NEEDED FOR DIZZINESS   60 tablet   0   . Menthol, Topical Analgesic, (BIOFREEZE EX)   Apply externally   Apply 1 each topically as needed (for pain).         [name] NAPROXEN DR 500 MG EC tablet      TAKE ONE TABLET BY MOUTH TWICE DAILY AS NEEDED   60 tablet   0   . NON FORMULARY      Oxygen 3 liters daily.         . NON FORMULARY      CPAP         . ondansetron (ZOFRAN) 8 MG tablet   Oral   Take 1 tablet (8 mg total) by mouth every 8 (eight) hours as needed for nausea or vomiting.   30 tablet   2   . oxyCODONE-acetaminophen (PERCOCET/ROXICET) 5-325 MG per tablet   Oral   Take 0.5 tablets by mouth every 8 (eight) hours as needed for severe pain.         . pantoprazole (PROTONIX) 40 MG tablet   Oral   Take 1 tablet (40 mg total) by mouth daily.   30 tablet   3   . polyethylene glycol (MIRALAX / GLYCOLAX) packet   Oral   Take 17 g by mouth daily as needed for moderate constipation.         . potassium chloride SA (KLOR-CON M20) 20 MEQ tablet   Oral   Take 1 tablet (20 mEq total) by mouth 2 (two) times daily.   60 tablet   6   . promethazine (PHENERGAN) 25 MG tablet    Oral   Take 25 mg by mouth every 4 (four) hours as needed for nausea.         . Simethicone (GAS-X PO)   Oral  Take 1 tablet by mouth as needed (for gas).          . torsemide (DEMADEX) 20 MG tablet   Oral   Take 1 tablet (20 mg total) by mouth 2 (two) times daily.   60 tablet   6   . verapamil (CALAN-SR) 240 MG CR tablet      TAKE ONE TABLET BY MOUTH ONCE DAILY   30 tablet   0     Allergies Aspirin; Other; and Penicillins  Family History  Problem Relation Age of Onset  . Heart disease Mother   . Hypertension Mother   . COPD Mother     was a smoker  . Thyroid disease Mother   . Hypertension Father   . Hypertension Sister   . Thyroid disease Sister   . Hypertension Sister   . Thyroid disease Sister   . Breast cancer Maternal Aunt     Social History Social History  Substance Use Topics  . Smoking status: Former Smoker -- 0.30 packs/day for 2 years    Types: Cigarettes    Quit date: 06/23/1990  . Smokeless tobacco: Never Used  . Alcohol Use: No    Review of Systems Constitutional: No fever/chills Eyes: No visual changes. ENT: No sore throat. Cardiovascular: + chest pain. Respiratory: +shortness of breath. Gastrointestinal: No abdominal pain.  No nausea, no vomiting.  No diarrhea.  No constipation. Genitourinary: Negative for dysuria. Musculoskeletal: Negative for back pain. Skin: Negative for rash. Neurological: Negative for headaches, focal weakness or numbness.  10-point ROS otherwise negative.  ____________________________________________   PHYSICAL EXAM:  VITAL SIGNS: ED Triage Vitals  Enc Vitals Group     BP 03/27/15 1543 172/86 mmHg     Pulse Rate 03/27/15 1543 104     Resp 03/27/15 1543 26     Temp 03/27/15 1543 99.5 F (37.5 C)     Temp Source 03/27/15 1543 Oral     SpO2 03/27/15 1543 94 %     Weight 03/27/15 1543 240 lb (108.863 kg)     Height 03/27/15 1543 5\' 1"  (1.549 m)     Head Cir --      Peak Flow --      Pain Score  03/27/15 1545 10     Pain Loc --      Pain Edu? --      Excl. in [place]? --     Constitutional: Alert and oriented. in severe distress secondary to pain, writhing on the bed, moaning.  Eyes: Conjunctivae are normal. PERRL. EOMI. Head: Atraumatic. Nose: No congestion/rhinnorhea. Mouth/Throat: Mucous membranes are moist.  Oropharynx non-erythematous. Neck: No stridor.  Cardiovascular: mildly tachycardic rate, regular rhythm. Grossly normal heart sounds.  Good peripheral circulation. Respiratory: Normal respiratory effort.  No retractions. Lungs CTAB. Gastrointestinal: Soft and nontender. No distention. No abdominal bruits. No CVA tenderness. Genitourinary: deferred Musculoskeletal: No lower extremity tenderness nor edema. chronic lymphedema of the left lower extremity. No pitting edema. Tenderness to palpation throughout the anterior chest wall as well as in the trapezius muscles bilaterally. Neurologic:  Normal speech and language. No gross focal neurologic deficits are appreciated. 5 out of 5 strength in bilateral upper and lower extremities, sensation intact to light touch throughout.  Skin:  Skin is warm, dry and intact. No rash noted. Psychiatric: Mood and affect are normal. Speech and behavior are normal.  ____________________________________________   LABS (all labs ordered are listed, but only abnormal results are displayed)  Labs Reviewed  CBC WITH DIFFERENTIAL/PLATELET -  Abnormal; Notable for the following:    WBC 15.7 (*)    Hemoglobin 11.7 (*)    MCHC 31.3 (*)    Neutro Abs 14.0 (*)    Lymphs Abs 0.8 (*)    All other components within normal limits  BASIC METABOLIC PANEL - Abnormal; Notable for the following:    Sodium 131 (*)    Chloride 94 (*)    Glucose, Bld 186 (*)    All other components within normal limits  TROPONIN I - Abnormal; Notable for the following:    Troponin I 0.09 (*)    All other components within normal limits    ____________________________________________  EKG  ED ECG REPORT I, Joanne Gavel, the attending physician, personally viewed and interpreted this ECG.   Date: 03/27/2015  EKG Time: 16:56  Rate: 108  Rhythm: sinus tachycardia with frequent PVCs  Axis: normal  Intervals:none  ST&T Change: No acute ST elevation   ____________________________________________  RADIOLOGY  CXR IMPRESSION: Mild pulmonary edema.  CT angio chest IMPRESSION: No evidence of aortic dissection or intramural hematoma.  Chronic pulmonary parenchymal disease is again noted. Interstitial lung disease or old granulomatous disease are considerations.  Multiple bilateral thyroid lesions are noted. Correlation with thyroid ultrasound is recommended.   CT angio neck IMPRESSION: 1. Mild for age carotid and vertebral artery atherosclerosis with no hemodynamically significant stenosis identified. Tortuous carotid arteries, both of which have a retropharyngeal course. 2. Large body habitus and chronic cervical spine degeneration. 3. Chest CTA from today reported separately. ____________________________________________   PROCEDURES  Procedure(s) performed: None  Critical Care performed: Yes, see critical care note(s). Total critical care time spent 30 minutes.  ____________________________________________   INITIAL IMPRESSION / ASSESSMENT AND PLAN / ED COURSE  Pertinent labs & imaging results that were available during my care of the patient were reviewed by me and considered in my medical decision making (see chart for details).  Debra Saunders is a 69 y.o. 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 On exam, she is in severe distress secondary to pain. She is [place] tachycardic, tachypnea. She has tenderness to  palpation throat the chest wall anteriorly as well as the trapezius muscles. She has intact neurological examination. Plan for screening cardiac labs, EKG, chest x-ray. Will also pursue CT angios chest to rule out dissection given the severity of her pain with radiation into the neck, we'll obtain CT angiogram neck as well to r/o carotid artery dissection this was offered previously but not pursued.   ----------------------------------------- 7:01 PM on 03/27/2015 -----------------------------------------  Labs reviewed and are notable for leukocytosis with hemoglobin 15.7. Hemoglobin 11.7, mild anemia. Mild hyponatremia with sodium 131. Troponin is elevated at 0.09, suspect her chest pain may be secondary to ACS. CT angio chest and neck negative for any acute dissection. I discussed the case with Dr. Caryl Comes, on call for Dr. Fletcher Anon, who recommends rechecking troponin in several hours prior to initiation of heparin given that her troponin is only mildly elevated now and given her history of intracranial hemorrhage. Case discussed with Dr. Margaretmary Eddy, hospitalist for admission at this time. Aspirin ordered. Patient reports the pain in her chest has improved significantly however now she is complaining of severe neck pain. We'll continue to attempt to control her pain. ____________________________________________   FINAL CLINICAL IMPRESSION(S) / ED DIAGNOSES  Final diagnoses:  Pain  Chest pain, unspecified chest pain type  Neck pain  NSTEMI (non-ST elevated myocardial infarction) ([place])      Joanne Gavel, MD 03/27/15 1903  Joanne Gavel, MD 03/27/15 1904

## 2015-03-27 NOTE — Progress Notes (Signed)
ANTICOAGULATION CONSULT NOTE - Initial Consult  Pharmacy Consult for Lovenox Indication: VTE prophylaxis  Allergies  Allergen Reactions  . Penicillins Hives, Shortness Of Breath, Swelling and Other (See Comments)    Has patient had a PCN reaction causing immediate rash, facial/tongue/throat swelling, SOB or lightheadedness with hypotension: Yes Has patient had a PCN reaction causing severe rash involving mucus membranes or skin necrosis: No Has patient had a PCN reaction that required hospitalization No Has patient had a PCN reaction occurring within the last 10 years: No If all of the above answers are "NO", then may proceed with Cephalosporin use.  . Aspirin Hives and Other (See Comments)    Pt states that she is unable to take because she had bleeding in her brain.    . Other Other (See Comments)    Pt states that she is unable to take blood thinners because she had bleeding in her brain.     Patient Measurements: Height: 5\' 1"  (154.9 cm) Weight: 240 lb (108.863 kg) IBW/kg (Calculated) : 47.8 Heparin Dosing Weight:   Vital Signs: Temp: 99.1 F (37.3 C) (10/04 2210) Temp Source: Oral (10/04 1543) BP: 142/80 mmHg (10/04 2210) Pulse Rate: 100 (10/04 2210)  Labs:  Recent Labs  03/27/15 1615  HGB 11.7*  HCT 37.4  PLT 245  CREATININE 0.68  TROPONINI 0.09*    Estimated Creatinine Clearance: 73.5 mL/min (by C-G formula based on Cr of 0.68).   Medical History: Past Medical History  Diagnosis Date  . Thyroid disorder   . Diabetes mellitus ([place])     type II  . Thyroid nodule   . Sleep apnea   . Obesity   . Hypertension   . Hyperlipidemia   . Urinary incontinence   . CHF (congestive heart failure) ([place]) 2005    Dx at [facility]  . Chronic back pain   . COPD (chronic obstructive pulmonary disease) ([place])     2L [place]  . Sleep apnea   . Lipoma of back   . Arthritis   . Streptococcal bacteremia   . Acute renal failure ([place])   . Stroke ([place])   . Sepsis with metabolic  encephalopathy ([place])   . Gastritis   . Symptomatic bradycardia     Medications:  Prescriptions prior to admission  Medication Sig Dispense Refill Last Dose  . acetaminophen (TYLENOL) 325 MG tablet Take 325-650 mg by mouth every 6 (six) hours as needed for mild pain.    03/26/2015 at 1330  . ambrisentan (LETAIRIS) 5 MG tablet Take 1 tablet (5 mg total) by mouth daily. 30 tablet 5 03/26/2015 at Unknown time  . diazepam (VALIUM) 5 MG tablet Take 5 mg by mouth every 8 (eight) hours as needed for muscle spasms (and/or neck pain).   03/27/2015 at Unknown time  . glipiZIDE (GLUCOTROL) 5 MG tablet Take 0.5 tablets (2.5 mg total) by mouth daily before breakfast. 60 tablet 3 03/26/2015 at Unknown time  . insulin detemir (LEVEMIR) 100 UNIT/ML injection Inject 0.4 mLs (40 Units total) into the skin at bedtime. 10 mL 5 Past Week at Unknown time  . meclizine (ANTIVERT) 25 MG tablet Take 25 mg by mouth daily as needed for dizziness.   Past Week at Unknown time  . Menthol, Topical Analgesic, (BIOFREEZE EX) Apply 1 application topically as needed (for pain).    03/27/2015 at Unknown time  . naproxen (EC NAPROSYN) 500 MG EC tablet Take 500 mg by mouth 2 (two) times daily as needed (for pain).  Past Week at Unknown time  . ondansetron (ZOFRAN) 8 MG tablet Take 1 tablet (8 mg total) by mouth every 8 (eight) hours as needed for nausea or vomiting. 30 tablet 2 PRN at PRN  . oxyCODONE-acetaminophen (PERCOCET/ROXICET) 5-325 MG per tablet Take 1 tablet by mouth every 8 (eight) hours as needed for severe pain.    03/27/2015 at 1200  . polyethylene glycol (MIRALAX / GLYCOLAX) packet Take 17 g by mouth daily as needed for moderate constipation.   Past Week at Unknown time  . potassium chloride SA (KLOR-CON M20) 20 MEQ tablet Take 1 tablet (20 mEq total) by mouth 2 (two) times daily. 60 tablet 6 03/26/2015 at Unknown time  . promethazine (PHENERGAN) 25 MG tablet Take 25 mg by mouth every 4 (four) hours as needed for nausea or  vomiting.    PRN at PRN  . torsemide (DEMADEX) 20 MG tablet Take 1 tablet (20 mg total) by mouth 2 (two) times daily. (Patient taking differently: Take 20 mg by mouth daily. ) 60 tablet 6 Past Week at Unknown time  . verapamil (CALAN-SR) 240 MG CR tablet Take 240 mg by mouth daily.   03/26/2015 at Unknown time    Assessment: CrCl = 73.5 ml/min BMI = 45.4  Goal of Therapy:  DVT prophylaxis   Plan:  Lovenox 30 mg SQ Q24H originally ordered.  Will adjust dose to Lovenox 40 mg SQ Q12H based on BMI > 40.   Nester Bachus D 03/27/2015,10:19 PM

## 2015-03-27 NOTE — ED Notes (Signed)
Pt's daughter came out and said that pt's chest pain more intense. Dilaudid and fluids administered per order. Pt moaning less.

## 2015-03-27 NOTE — ED Notes (Signed)
Patient arrived by ems from home. Patient c/o neck pain and was just seen here X2 days ago. Patient was prescribed diazepam, naproxen, and oxycodone with no relief at home.

## 2015-03-27 NOTE — H&P (Signed)
[place] [facility] [place] NAME: Debra Saunders    MR#:  [id number]  DATE OF BIRTH:  [birth year]  DATE OF ADMISSION:  03/27/2015  PRIMARY CARE PHYSICIAN: [name], MD   REQUESTING/REFERRING PHYSICIAN: Edd Fabian  CHIEF COMPLAINT:   Chest pain HISTORY OF PRESENT ILLNESS:  Debra Saunders  is a 69 y.o. female with a known history of chronic diastolic congestive heart failure, insulin-dependent mellitus, history of pulmonary embolism status post IVC filter, COPD with chronic hypoxic respiratory failure lives on 2-3 L of oxygen via nasal cannula is presenting to the ED with a chief complaint of chest pain radiating to the neck and to the back. It is associated with some mild shortness of breath, radiates into both shoulders and down her right and left arm. She denies having this previously. She is also complaining of severe neck pain without trauma. She was seen here for the neck pain and 03/25/2015 with a contrasted CT neck which is reassuring. Patient's pain is across the anterior chest wall and gets worse with any kind of movements. Denies any fever or chills. Initial troponin is at 0.09.  PAST MEDICAL HISTORY:   Past Medical History  Diagnosis Date  . Thyroid disorder   . Diabetes mellitus ([place])     type II  . Thyroid nodule   . Sleep apnea   . Obesity   . Hypertension   . Hyperlipidemia   . Urinary incontinence   . CHF (congestive heart failure) ([place]) 2005    Dx at [facility]  . Chronic back pain   . COPD (chronic obstructive pulmonary disease) ([place])     2L [place]  . Sleep apnea   . Lipoma of back   . Arthritis   . Streptococcal bacteremia   . Acute renal failure ([place])   . Stroke ([place])   . Sepsis with metabolic encephalopathy ([place])   . Gastritis   . Symptomatic bradycardia     PAST SURGICAL HISTOIRY:   Past Surgical History  Procedure Laterality Date  . Cholecystectomy    . Vaginal hysterectomy    . Cardiac catheterization   2013    @ [place]: No obstructive CAD: Only 20% ostial left circumflex.  . Abdominal hysterectomy  1969  . Cyst removal trunk  2002    BACK  . Lipoma removal  2014    back  . Pacemaker insertion  2015    [place] dual chamber pacemaker implanted by Dr Caryl Comes for symptomatic bradycardia  . Insert / replace / remove pacemaker    . Permanent pacemaker insertion N/A 01/09/2014    Procedure: PERMANENT PACEMAKER INSERTION;  Surgeon: Deboraha Sprang, MD;  Location: [facility] CATH LAB;  Service: Cardiovascular;  Laterality: N/A;    SOCIAL HISTORY:   Social History  Substance Use Topics  . Smoking status: Former Smoker -- 0.30 packs/day for 2 years    Types: Cigarettes    Quit date: 06/23/1990  . Smokeless tobacco: Never Used  . Alcohol Use: No    FAMILY HISTORY:   Family History  Problem Relation Age of Onset  . Heart disease Mother   . Hypertension Mother   . COPD Mother     was a smoker  . Thyroid disease Mother   . Hypertension Father   . Hypertension Sister   . Thyroid disease Sister   . Hypertension Sister   . Thyroid disease Sister   . Breast cancer Maternal Aunt     DRUG  ALLERGIES:   Allergies  Allergen Reactions  . Penicillins Hives, Shortness Of Breath, Swelling and Other (See Comments)    Has patient had a PCN reaction causing immediate rash, facial/tongue/throat swelling, SOB or lightheadedness with hypotension: Yes Has patient had a PCN reaction causing severe rash involving mucus membranes or skin necrosis: No Has patient had a PCN reaction that required hospitalization No Has patient had a PCN reaction occurring within the last 10 years: No If all of the above answers are "NO", then may proceed with Cephalosporin use.  . Aspirin Hives and Other (See Comments)    Pt states that she is unable to take because she had bleeding in her brain.    . Other Other (See Comments)    Pt states that she is unable to take blood thinners because she had bleeding in her brain.      REVIEW OF SYSTEMS:  CONSTITUTIONAL: No fever, fatigue or weakness.  EYES: No blurred or double vision.  EARS, NOSE, AND THROAT: No tinnitus or ear pain.  RESPIRATORY: No cough, shortness of breath, wheezing or hemoptysis.  CARDIOVASCULAR: Anterior chest pain, denies orthopnea, edema.  GASTROINTESTINAL: No nausea, vomiting, diarrhea or abdominal pain.  GENITOURINARY: No dysuria, hematuria.  ENDOCRINE: No polyuria, nocturia,  HEMATOLOGY: No anemia, easy bruising or bleeding SKIN: No rash or lesion. MUSCULOSKELETAL: No joint pain or arthritis.   NEUROLOGIC: No tingling, numbness, weakness.  PSYCHIATRY: No anxiety or depression.   MEDICATIONS AT HOME:   Prior to Admission medications   Medication Sig Start Date End Date Taking? Authorizing Provider  acetaminophen (TYLENOL) 325 MG tablet Take 325-650 mg by mouth every 6 (six) hours as needed for mild pain.    Yes Historical Provider, MD  ambrisentan (LETAIRIS) 5 MG tablet Take 1 tablet (5 mg total) by mouth daily. 07/27/14  Yes Juanito Doom, MD  diazepam (VALIUM) 5 MG tablet Take 5 mg by mouth every 8 (eight) hours as needed for muscle spasms (and/or neck pain).   Yes Historical Provider, MD  glipiZIDE (GLUCOTROL) 5 MG tablet Take 0.5 tablets (2.5 mg total) by mouth daily before breakfast. 03/19/15  Yes Jackolyn Confer, MD  insulin detemir (LEVEMIR) 100 UNIT/ML injection Inject 0.4 mLs (40 Units total) into the skin at bedtime. 12/14/14  Yes Jackolyn Confer, MD  meclizine (ANTIVERT) 25 MG tablet Take 25 mg by mouth daily as needed for dizziness.   Yes Historical Provider, MD  Menthol, Topical Analgesic, (BIOFREEZE EX) Apply 1 application topically as needed (for pain).    Yes Historical Provider, MD  naproxen (EC NAPROSYN) 500 MG EC tablet Take 500 mg by mouth 2 (two) times daily as needed (for pain).   Yes Historical Provider, MD  ondansetron (ZOFRAN) 8 MG tablet Take 1 tablet (8 mg total) by mouth every 8 (eight) hours as needed  for nausea or vomiting. 09/25/14  Yes Jackolyn Confer, MD  oxyCODONE-acetaminophen (PERCOCET/ROXICET) 5-325 MG per tablet Take 1 tablet by mouth every 8 (eight) hours as needed for severe pain.    Yes Historical Provider, MD  polyethylene glycol (MIRALAX / GLYCOLAX) packet Take 17 g by mouth daily as needed for moderate constipation.   Yes Historical Provider, MD  potassium chloride SA (KLOR-CON M20) 20 MEQ tablet Take 1 tablet (20 mEq total) by mouth 2 (two) times daily. 11/09/14  Yes Jackolyn Confer, MD  promethazine (PHENERGAN) 25 MG tablet Take 25 mg by mouth every 4 (four) hours as needed for nausea or vomiting.  Yes Historical Provider, MD  torsemide (DEMADEX) 20 MG tablet Take 1 tablet (20 mg total) by mouth 2 (two) times daily. Patient taking differently: Take 20 mg by mouth daily.  07/18/14  Yes Deboraha Sprang, MD  verapamil (CALAN-SR) 240 MG CR tablet Take 240 mg by mouth daily.   Yes Historical Provider, MD      VITAL SIGNS:  Blood pressure 154/86, pulse 106, temperature 99.5 F (37.5 C), temperature source Oral, resp. rate 24, height 5\' 1"  (1.549 m), weight 108.863 kg (240 lb), SpO2 99 %.  PHYSICAL EXAMINATION:  GENERAL:  69 y.o.-year-old patient lying in the bed with no acute distress.  EYES: Pupils equal, round, reactive to light and accommodation. No scleral icterus. Extraocular muscles intact.  HEENT: Head atraumatic, normocephalic. Oropharynx and nasopharynx clear.  NECK:  Supple, no jugular venous distention. No thyroid enlargement, no tenderness.  LUNGS: Normal breath sounds bilaterally, no wheezing, rales,rhonchi or crepitation. No use of accessory muscles of respiration.  CARDIOVASCULAR: Reproducible anterior chest wall tenderness, S1, S2 normal. No murmurs, rubs, or gallops.  ABDOMEN: Soft, nontender, nondistended. Bowel sounds present. No organomegaly or mass.  EXTREMITIES: No pedal edema, cyanosis, or clubbing.  NEUROLOGIC: Cranial nerves II through XII are intact.  Muscle strength 5/5 in all extremities. Sensation intact. Gait not checked.  PSYCHIATRIC: The patient is alert and oriented x 3.  SKIN: No obvious rash, lesion, or ulcer.   LABORATORY PANEL:   CBC  Recent Labs Lab 03/27/15 1615  WBC 15.7*  HGB 11.7*  HCT 37.4  PLT 245   ------------------------------------------------------------------------------------------------------------------  Chemistries   Recent Labs Lab 03/27/15 1615  NA 131*  K 3.7  CL 94*  CO2 26  GLUCOSE 186*  BUN 9  CREATININE 0.68  CALCIUM 9.1   ------------------------------------------------------------------------------------------------------------------  Cardiac Enzymes  Recent Labs Lab 03/27/15 1615  TROPONINI 0.09*   ------------------------------------------------------------------------------------------------------------------  RADIOLOGY:  Ct Angio Neck W/cm &/or Wo/cm  03/27/2015   CLINICAL DATA:  69 year old female with neck pain radiating to both shoulders and down the left arm. Symptoms for 4 days. Subsequent encounter.  EXAM: CT ANGIOGRAPHY NECK  TECHNIQUE: Multidetector CT imaging of the neck was performed using the standard protocol during bolus administration of intravenous contrast. Multiplanar CT image reconstructions and MIPs were obtained to evaluate the vascular anatomy. Carotid stenosis measurements (when applicable) are obtained utilizing NASCET criteria, using the distal internal carotid diameter as the denominator.  CONTRAST:  163mL OMNIPAQUE IOHEXOL 350 MG/ML SOLN in conjunction with chest CTA reported separately.  COMPARISON:  Cervical spine CT 03/25/2015. Carotid Doppler ultrasound 12/15/2014. Brain MRI 04/22/2013.  FINDINGS: Aortic arch: Bovine arch configuration. Intermittent calcified plaque at the great vessel origins with no hemodynamically significant great vessel origin stenosis.  Right carotid system: Tortuous proximal right CCA. Retropharyngeal course of the right CCA.  Retropharyngeal right carotid bifurcation. Mild calcified plaque in the medial right ICA bulb. No associated stenosis. Negative visible right ICA siphon.  Left carotid system: Tortuous left CCA. Retropharyngeal course of the distal left CCA and left carotid bifurcation. Mostly soft plaque in the left ICA origin resulting in less than 50 % stenosis with respect to the distal vessel. Moderately tortuous cervical left ICA. Negative visible left ICA siphon.  Vertebral arteries:Tortuous proximal right subclavian artery with a kinked appearance, otherwise no proximal subclavian stenosis. Streak and quantum mottle artifact at the right vertebral artery origin which appears grossly normal. No right vertebral artery stenosis to the skullbase. No intracranial vertebral artery stenosis. Patent vertebrobasilar junction with  dominant appearing right AICA. Visible basilar artery is negative.  Mildly tortuous proximal left subclavian artery. Streak in quantum mottle artifact at the left vertebral artery origin which appears grossly normal. No vertebral artery stenosis in the neck. Mild calcified plaque in the left V4 segment with no associated stenosis. This is just proximal to the left PICA origin which remains patent. Normal left vertebrobasilar junction.  Skeleton: Large body habitus. Multilevel degenerative changes in the cervical spine. Trace anterolisthesis at C6-C7 with associated facet degeneration greater on the left. No acute osseous abnormality identified. Visualized paranasal sinuses and mastoids are clear.  Other neck: Chest findings today reported separately. Partially visible left chest cardiac pacemaker type device.  Thyromegaly, no discrete thyroid nodule identified. Larynx, pharynx, parapharyngeal spaces, retropharyngeal space (retropharyngeal course of both carotid), sublingual space, submandibular glands, and parotid glands are within normal limits. Negative visualized brain parenchyma. No cervical  lymphadenopathy.  IMPRESSION: 1. Mild for age carotid and vertebral artery atherosclerosis with no hemodynamically significant stenosis identified. Tortuous carotid arteries, both of which have a retropharyngeal course. 2. Large body habitus and chronic cervical spine degeneration. 3. Chest CTA from today reported separately.   Electronically Signed   By: Genevie Ann M.D.   On: 03/27/2015 18:05   Dg Chest Portable 1 View  03/27/2015   CLINICAL DATA:  Patient has central chest pain that began on Monday after getting a flu shot patient states. Patient also states that the pain began in her posterior skull and began to radiate forward into her chest and has worsened today.  EXAM: PORTABLE CHEST 1 VIEW  COMPARISON:  10/13/2014  FINDINGS: Left-sided transvenous pacemaker leads to the right atrium and right ventricle. Heart is mildly enlarged. Mild pulmonary edema is present. There are no focal consolidations. No definite effusions. Study quality is compromised by portable technique and patient body habitus.  IMPRESSION: Mild pulmonary edema.   Electronically Signed   By: Nolon Nations M.D.   On: 03/27/2015 17:31   Ct Angio Chest Aorta W/cm &/or Wo/cm  03/27/2015   CLINICAL DATA:  Neck pain radiating to shoulders and left arm.  EXAM: CT ANGIOGRAPHY CHEST WITH CONTRAST  TECHNIQUE: Multidetector CT imaging of the chest was performed using the standard protocol during bolus administration of intravenous contrast. Multiplanar CT image reconstructions and MIPs were obtained to evaluate the vascular anatomy.  CONTRAST:  194mL OMNIPAQUE IOHEXOL 350 MG/ML SOLN  COMPARISON:  09/12/2014  FINDINGS: There is no evidence of aortic dissection, aneurysm, or intramural hematoma. The great vessels are grossly patent allowing for artifact at the thoracic inlet.  No obvious filling defect is present in the pulmonary arterial tree to suggest acute pulmonary thromboembolism. There are prominent central pulmonary arteries suggesting  elevated pulmonary artery pressure  Calcified mediastinal nodes.  AICD device is in place.  No pneumothorax.  No pleural effusion.  Abnormal linear and ground-glass opacities throughout the lungs are not significantly changed. It is most prominent in the upper lung zones. This may be simply related to chronic or old granulomatous disease.  Innumerable thyroid lesions are seen bilaterally.  No acute bony deformity.  Postcholecystectomy.  Review of the MIP images confirms the above findings.  IMPRESSION: No evidence of aortic dissection or intramural hematoma.  Chronic pulmonary parenchymal disease is again noted. Interstitial lung disease or old granulomatous disease are considerations.  Multiple bilateral thyroid lesions are noted. Correlation with thyroid ultrasound is recommended.   Electronically Signed   By: Marybelle Killings M.D.   On: 03/27/2015  18:08    EKG:   Orders placed or performed during the hospital encounter of 03/27/15  . ED EKG  . ED EKG  . EKG 12-Lead  . EKG 12-Lead    IMPRESSION AND PLAN:   Debra Saunders  is a 69 y.o. female with a known history of chronic diastolic congestive heart failure, insulin-dependent mellitus, history of pulmonary embolism status post IVC filter, COPD with chronic hypoxic respiratory failure lives on 2-3 L of oxygen via nasal cannula is presenting to the ED with a chief complaint of chest pain radiating to the neck and to the back. It is associated with some mild shortness of breath, radiates into both shoulders and down her right and left arm. She denies having this previously   1.Atypical chest pain-probably musculoskeletal, will rule out acute MI in view of elevated troponin Admit to telemetry Cycle cardiac biomarkers Nothing by mouth after midnight Cardiac consult is placed to Dr. Fletcher Anon Patient had past medical history of intracranial bleed approximately 2 years ago so not anticoagulating the patient with therapeutic dose of heparin or Lovenox at  this time, ER physician has discussed with cardiology, they have recommended to monitor the troponin trended this time Check fasting lipid panel in a.m. Continue statin and we'll start the patient on small dose beta blocker  patient is allergic to aspirin gives her hives  2. Chronic history of diastolic congestive heart failure Currently patient is not fluid overloaded Continue torsemide and small dose beta blockers Will provide statin Monitor intake and output Monitor daily weights  3. Insulin requiring diabetes mellitus Patient takes Levemir 45 units subcutaneous daily at bedtime Currently patient is nothing by mouth we will give 10 units for basal coverage Will provide sliding scale insulin  4. Thyroid abnormality outpatient workup by primary care physician is recommended  5 chronic history of hypertension Continue her home medication verapamil Continue Demadex Patient is started on small dose beta blocker    GI prophylaxis with Pepcid DVT prophylaxis with Lovenox 30 mg subcutaneous    All the records are reviewed and case discussed with ED provider. Management plans discussed with the patient, family and they are in agreement.  Greater than 50% time was spent on face-to-face counseling and coordination of care  CODE STATUS: Full code, daughter is the healthcare power of attorney  TOTAL TIME TAKING CARE OF THIS PATIENT: 45 minutes.    Nicholes Mango M.D on 03/27/2015 at 8:24 PM  Between 7am to 6pm - Pager - [phone]  After 6pm go to [email or web address] - password [email or web address] [facility]  Office  [phone]  CC: Primary care physician; [name], MD

## 2015-03-27 NOTE — ED Notes (Signed)
Pt presents from home c/o pain to her neck that radiates to both shoulders and down left arm. She reports that she was seen here on Saturday night/Sunday morning and given diazepam, naproxen, and oxycodone/acetoaminophen with no relief. Her last dose was at noon today of the oxycodone and diazepam (half dose because it makes her sleepy). Daughter reports that she has not had an appetite since the pain began. Pt denies injury or trauma and states she has never had anything like this before. Pt is moaning during assessment.

## 2015-03-28 ENCOUNTER — Encounter: Payer: Self-pay | Admitting: Physician Assistant

## 2015-03-28 DIAGNOSIS — E785 Hyperlipidemia, unspecified: Secondary | ICD-10-CM | POA: Diagnosis not present

## 2015-03-28 DIAGNOSIS — R748 Abnormal levels of other serum enzymes: Secondary | ICD-10-CM | POA: Diagnosis not present

## 2015-03-28 DIAGNOSIS — I5032 Chronic diastolic (congestive) heart failure: Secondary | ICD-10-CM | POA: Diagnosis not present

## 2015-03-28 DIAGNOSIS — E079 Disorder of thyroid, unspecified: Secondary | ICD-10-CM | POA: Diagnosis not present

## 2015-03-28 DIAGNOSIS — I10 Essential (primary) hypertension: Secondary | ICD-10-CM | POA: Diagnosis not present

## 2015-03-28 DIAGNOSIS — R7989 Other specified abnormal findings of blood chemistry: Secondary | ICD-10-CM | POA: Diagnosis not present

## 2015-03-28 DIAGNOSIS — R0789 Other chest pain: Secondary | ICD-10-CM

## 2015-03-28 DIAGNOSIS — M542 Cervicalgia: Secondary | ICD-10-CM | POA: Diagnosis not present

## 2015-03-28 DIAGNOSIS — R079 Chest pain, unspecified: Secondary | ICD-10-CM | POA: Diagnosis not present

## 2015-03-28 DIAGNOSIS — I110 Hypertensive heart disease with heart failure: Secondary | ICD-10-CM | POA: Diagnosis not present

## 2015-03-28 DIAGNOSIS — E119 Type 2 diabetes mellitus without complications: Secondary | ICD-10-CM | POA: Diagnosis not present

## 2015-03-28 DIAGNOSIS — I248 Other forms of acute ischemic heart disease: Secondary | ICD-10-CM

## 2015-03-28 DIAGNOSIS — Z794 Long term (current) use of insulin: Secondary | ICD-10-CM | POA: Diagnosis not present

## 2015-03-28 LAB — CBC
HCT: 35.3 % (ref 35.0–47.0)
Hemoglobin: 11.1 g/dL — ABNORMAL LOW (ref 12.0–16.0)
MCH: 26.2 pg (ref 26.0–34.0)
MCHC: 31.3 g/dL — ABNORMAL LOW (ref 32.0–36.0)
MCV: 83.6 fL (ref 80.0–100.0)
Platelets: 253 10*3/uL (ref 150–440)
RBC: 4.22 MIL/uL (ref 3.80–5.20)
RDW: 13.6 % (ref 11.5–14.5)
WBC: 15.7 10*3/uL — ABNORMAL HIGH (ref 3.6–11.0)

## 2015-03-28 LAB — BASIC METABOLIC PANEL
Anion gap: 8 (ref 5–15)
BUN: 20 mg/dL (ref 6–20)
CO2: 28 mmol/L (ref 22–32)
Calcium: 9 mg/dL (ref 8.9–10.3)
Chloride: 97 mmol/L — ABNORMAL LOW (ref 101–111)
Creatinine, Ser: 0.65 mg/dL (ref 0.44–1.00)
GFR calc Af Amer: >60 mL/min (ref >60)
GFR calc non Af Amer: >60 mL/min (ref >60)
Glucose, Bld: 151 mg/dL — ABNORMAL HIGH (ref 65–99)
Potassium: 4 mmol/L (ref 3.5–5.1)
Sodium: 133 mmol/L — ABNORMAL LOW (ref 135–145)

## 2015-03-28 LAB — LIPID PANEL
Cholesterol: 160 mg/dL (ref 0–200)
HDL: 48 mg/dL (ref >40)
LDL Cholesterol: 91 mg/dL (ref 0–99)
Total CHOL/HDL Ratio: 3.3 RATIO
Triglycerides: 107 mg/dL (ref <150)
VLDL: 21 mg/dL (ref 0–40)

## 2015-03-28 LAB — TROPONIN I
Troponin I: 0.09 ng/mL — ABNORMAL HIGH (ref <0.031)
Troponin I: 0.14 ng/mL — ABNORMAL HIGH (ref <0.031)

## 2015-03-28 LAB — GLUCOSE, CAPILLARY
Glucose-Capillary: 152 mg/dL — ABNORMAL HIGH (ref 65–99)
Glucose-Capillary: 189 mg/dL — ABNORMAL HIGH (ref 65–99)

## 2015-03-28 LAB — HEMOGLOBIN A1C: Hgb A1c MFr Bld: 6.5 % — ABNORMAL HIGH (ref 4.0–6.0)

## 2015-03-28 MED ORDER — HYDROMORPHONE HCL 1 MG/ML IJ SOLN
0.5000 mg | Freq: Once | INTRAMUSCULAR | Status: AC
  Administered 2015-03-28: 0.5 mg via INTRAVENOUS
  Filled 2015-03-28: qty 1

## 2015-03-28 MED ORDER — CYCLOBENZAPRINE HCL 5 MG PO TABS: 7.5000 mg | ORAL_TABLET | Freq: Three times a day (TID) | ORAL | Status: DC

## 2015-03-28 MED ORDER — OXYCODONE-ACETAMINOPHEN 5-325 MG PO TABS: 1.0000 | ORAL_TABLET | Freq: Three times a day (TID) | ORAL | Status: DC | PRN

## 2015-03-28 MED ORDER — CYCLOBENZAPRINE HCL 5 MG PO TABS: 5.0000 mg | ORAL_TABLET | Freq: Three times a day (TID) | ORAL | Status: DC | PRN

## 2015-03-28 NOTE — Progress Notes (Signed)
Inpatient Diabetes Program Recommendations  AACE/ADA: New Consensus Statement on Inpatient Glycemic Control (2015)  Target Ranges:  Prepandial:   less than 140 mg/dL      Peak postprandial:   less than 180 mg/dL (1-2 hours)      Critically ill patients:  140 - 180 mg/dL   Review of Glycemic Control:  Results for Debra Saunders, Debra Saunders (MRN [id number]) as of 03/28/2015 10:38  Ref. Range 03/27/2015 21:14 03/28/2015 07:33  Glucose-Capillary Latest Ref Range: 65-99 mg/dL 169 (H) 152 (H)   Diabetes history: Type 2 diabetes Outpatient Diabetes medications: Levemir 40 units q HS, Glucotrol 2.5 mg daily Current orders for Inpatient glycemic control:  Novolog sensitive tid with meals and HS, Glucotrol 2.5 mg daily, Levemir 10 units daily  A1C pending.  Blood sugars are within hospital goals at this time.   Thanks, Adah Perl, RN, BC-ADM Inpatient Diabetes Coordinator Pager [phone] (8a-5p)

## 2015-03-28 NOTE — Discharge Summary (Signed)
[place] [facility] [place] NAME: Debra Saunders    MR#:  [id number]  DATE OF BIRTH:  1946-01-10  DATE OF ADMISSION:  03/27/2015 ADMITTING PHYSICIAN: Debra Mango, MD  DATE OF DISCHARGE: 03/28/2015 PRIMARY CARE PHYSICIAN: [name], MD    ADMISSION DIAGNOSIS:  Neck pain [M54.2] Pain [R52] NSTEMI (non-ST elevated myocardial infarction) ([place]) [I21.4] Chest pain, unspecified chest pain type [R07.9]  DISCHARGE DIAGNOSIS:  Active Problems:   Chest pain   SECONDARY DIAGNOSIS:   Past Medical History  Diagnosis Date  . Thyroid disorder   . Diabetes mellitus ([place])     type II  . Thyroid nodule   . Sleep apnea   . Obesity   . Hypertension   . Hyperlipidemia   . Urinary incontinence   . CHF (congestive heart failure) ([place]) 2005    Dx at [facility]  . Chronic back pain   . COPD (chronic obstructive pulmonary disease) ([place])     2L [place]  . Sleep apnea   . Lipoma of back   . Arthritis   . Streptococcal bacteremia   . Acute renal failure ([place])   . Stroke ([place])   . Sepsis with metabolic encephalopathy ([place])   . Gastritis   . Symptomatic bradycardia     HOSPITAL COURSE:  69 y.o. female with a known history of chronic diastolic congestive heart failure, insulin-dependent mellitus, history of pulmonary embolism status post IVC filter, COPD with chronic hypoxic respiratory failure lives on 2-3 L of oxygen via nasal cannula is presenting to the ED with a chief complaint of chest pain radiating to the neck and to the back. For further details refer to H and P.  1. Neck pain: Patient's pain is purely musculoskeletall in nature and not cardiac in origin. She has pain with palpation and also has muscle spasms upper shoulder area. She underwent CT AORTA on admission which was negative for dissection. She was evaluated by cardiology as well.  Her chest pain was associated with musculoskeletal pain.  2. Chronic history of diastolic congestive  heart failure Currently patient is not fluid overloaded Continue torsemide and small dose beta blockers  3. Insulin requiring diabetes mellitus Patient takes Levemir 45 units subcutaneous daily at bedtime   4. Thyroid abnormality on CT scan: outpatient workup by primary care physician is recommended  5 accelerated HTN on essential  Hypertension: improved BP Continue her home medication verapamil Continue Demadex  6. Elevated troponin: This is from accelerated HTN.  DISCHARGE CONDITIONS AND DIET:  Stable condition heart healthy diet  CONSULTS OBTAINED:  Treatment Team:  Debra Mango, MD Leonie Man, MD  DRUG ALLERGIES:   Allergies  Allergen Reactions  . Penicillins Hives, Shortness Of Breath, Swelling and Other (See Comments)    Has patient had a PCN reaction causing immediate rash, facial/tongue/throat swelling, SOB or lightheadedness with hypotension: Yes Has patient had a PCN reaction causing severe rash involving mucus membranes or skin necrosis: No Has patient had a PCN reaction that required hospitalization No Has patient had a PCN reaction occurring within the last 10 years: No If all of the above answers are "NO", then may proceed with Cephalosporin use.  . Aspirin Hives and Other (See Comments)    Pt states that she is unable to take because she had bleeding in her brain.    . Other Other (See Comments)    Pt states that she is unable to take blood thinners because she had bleeding  in her brain.     DISCHARGE MEDICATIONS:   Current Discharge Medication List    START taking these medications   Details  cyclobenzaprine (FLEXERIL) 5 MG tablet Take 1 tablet (5 mg total) by mouth 3 (three) times daily as needed for muscle spasms. Qty: 30 tablet, Refills: 0      CONTINUE these medications which have NOT CHANGED   Details  acetaminophen (TYLENOL) 325 MG tablet Take 325-650 mg by mouth every 6 (six) hours as needed for mild pain.     ambrisentan (LETAIRIS) 5  MG tablet Take 1 tablet (5 mg total) by mouth daily. Qty: 30 tablet, Refills: 5    diazepam (VALIUM) 5 MG tablet Take 5 mg by mouth every 8 (eight) hours as needed for muscle spasms (and/or neck pain).    glipiZIDE (GLUCOTROL) 5 MG tablet Take 0.5 tablets (2.5 mg total) by mouth daily before breakfast. Qty: 60 tablet, Refills: 3    insulin detemir (LEVEMIR) 100 UNIT/ML injection Inject 0.4 mLs (40 Units total) into the skin at bedtime. Qty: 10 mL, Refills: 5    meclizine (ANTIVERT) 25 MG tablet Take 25 mg by mouth daily as needed for dizziness.    Menthol, Topical Analgesic, (BIOFREEZE EX) Apply 1 application topically as needed (for pain).     naproxen (EC NAPROSYN) 500 MG EC tablet Take 500 mg by mouth 2 (two) times daily as needed (for pain).    ondansetron (ZOFRAN) 8 MG tablet Take 1 tablet (8 mg total) by mouth every 8 (eight) hours as needed for nausea or vomiting. Qty: 30 tablet, Refills: 2    oxyCODONE-acetaminophen (PERCOCET/ROXICET) 5-325 MG per tablet Take 1 tablet by mouth every 8 (eight) hours as needed for severe pain.     polyethylene glycol (MIRALAX / GLYCOLAX) packet Take 17 g by mouth daily as needed for moderate constipation.    potassium chloride SA (KLOR-CON M20) 20 MEQ tablet Take 1 tablet (20 mEq total) by mouth 2 (two) times daily. Qty: 60 tablet, Refills: 6    promethazine (PHENERGAN) 25 MG tablet Take 25 mg by mouth every 4 (four) hours as needed for nausea or vomiting.     torsemide (DEMADEX) 20 MG tablet Take 1 tablet (20 mg total) by mouth 2 (two) times daily. Qty: 60 tablet, Refills: 6    verapamil (CALAN-SR) 240 MG CR tablet Take 240 mg by mouth daily.              Today   CHIEF COMPLAINT:  Neck pain significant no chest pain or SOB wears O2 at home   VITAL SIGNS:  Blood pressure 105/49, pulse 89, temperature 98.6 F (37 C), temperature source Oral, resp. rate 18, height 5\' 1"  (1.549 m), weight 100.472 kg (221 lb 8 oz), SpO2 93  %.   REVIEW OF SYSTEMS:  Review of Systems  Constitutional: Negative for fever, chills and malaise/fatigue.  HENT: Negative for sore throat.   Eyes: Negative for blurred vision.  Respiratory: Negative for cough, hemoptysis, shortness of breath and wheezing.   Cardiovascular: Negative for chest pain, palpitations and leg swelling.  Gastrointestinal: Negative for nausea, vomiting, abdominal pain, diarrhea and blood in stool.  Genitourinary: Negative for dysuria.  Musculoskeletal: Positive for neck pain. Negative for back pain.  Neurological: Negative for dizziness, tremors and headaches.  Endo/Heme/Allergies: Does not bruise/bleed easily.     PHYSICAL EXAMINATION:  GENERAL:  69 y.o.-year-old patient lying in the bed with no acute distress.  NECK:  Supple, no jugular venous  distention. No thyroid enlargement, no tenderness.  LUNGS: Normal breath sounds bilaterally, no wheezing, rales,rhonchi  No use of accessory muscles of respiration.  CARDIOVASCULAR: S1, S2 normal. No murmurs, rubs, or gallops.  ABDOMEN: Soft, non-tender, non-distended. Bowel sounds present. No organomegaly or mass.  EXTREMITIES: No pedal edema, cyanosis, or clubbing.  PSYCHIATRIC: The patient is alert and oriented x 3.  SKIN: No obvious rash, lesion, or ulcer.  Patient with significant pain on palpation neck and chest wall also with muscle spasm upper neck area  DATA REVIEW:   CBC  Recent Labs Lab 03/28/15 0415  WBC 15.7*  HGB 11.1*  HCT 35.3  PLT 253    Chemistries   Recent Labs Lab 03/28/15 0415  NA 133*  K 4.0  CL 97*  CO2 28  GLUCOSE 151*  BUN 20  CREATININE 0.65  CALCIUM 9.0    Cardiac Enzymes  Recent Labs Lab 03/27/15 2315 03/28/15 0415 03/28/15 1014  TROPONINI 0.06* 0.09* 0.14*    Microbiology Results  @[email or web address] @  RADIOLOGY:  Ct Angio Neck W/cm &/or Wo/cm  03/27/2015   CLINICAL DATA:  69 year old female with neck pain radiating to both shoulders and down the left arm.  Symptoms for 4 days. Subsequent encounter.  EXAM: CT ANGIOGRAPHY NECK  TECHNIQUE: Multidetector CT imaging of the neck was performed using the standard protocol during bolus administration of intravenous contrast. Multiplanar CT image reconstructions and MIPs were obtained to evaluate the vascular anatomy. Carotid stenosis measurements (when applicable) are obtained utilizing NASCET criteria, using the distal internal carotid diameter as the denominator.  CONTRAST:  163mL OMNIPAQUE IOHEXOL 350 MG/ML SOLN in conjunction with chest CTA reported separately.  COMPARISON:  Cervical spine CT 03/25/2015. Carotid Doppler ultrasound 12/15/2014. Brain MRI 04/22/2013.  FINDINGS: Aortic arch: Bovine arch configuration. Intermittent calcified plaque at the great vessel origins with no hemodynamically significant great vessel origin stenosis.  Right carotid system: Tortuous proximal right CCA. Retropharyngeal course of the right CCA. Retropharyngeal right carotid bifurcation. Mild calcified plaque in the medial right ICA bulb. No associated stenosis. Negative visible right ICA siphon.  Left carotid system: Tortuous left CCA. Retropharyngeal course of the distal left CCA and left carotid bifurcation. Mostly soft plaque in the left ICA origin resulting in less than 50 % stenosis with respect to the distal vessel. Moderately tortuous cervical left ICA. Negative visible left ICA siphon.  Vertebral arteries:Tortuous proximal right subclavian artery with a kinked appearance, otherwise no proximal subclavian stenosis. Streak and quantum mottle artifact at the right vertebral artery origin which appears grossly normal. No right vertebral artery stenosis to the skullbase. No intracranial vertebral artery stenosis. Patent vertebrobasilar junction with dominant appearing right AICA. Visible basilar artery is negative.  Mildly tortuous proximal left subclavian artery. Streak in quantum mottle artifact at the left vertebral artery origin  which appears grossly normal. No vertebral artery stenosis in the neck. Mild calcified plaque in the left V4 segment with no associated stenosis. This is just proximal to the left PICA origin which remains patent. Normal left vertebrobasilar junction.  Skeleton: Large body habitus. Multilevel degenerative changes in the cervical spine. Trace anterolisthesis at C6-C7 with associated facet degeneration greater on the left. No acute osseous abnormality identified. Visualized paranasal sinuses and mastoids are clear.  Other neck: Chest findings today reported separately. Partially visible left chest cardiac pacemaker type device.  Thyromegaly, no discrete thyroid nodule identified. Larynx, pharynx, parapharyngeal spaces, retropharyngeal space (retropharyngeal course of both carotid), sublingual space, submandibular glands, and parotid glands are within  normal limits. Negative visualized brain parenchyma. No cervical lymphadenopathy.  IMPRESSION: 1. Mild for age carotid and vertebral artery atherosclerosis with no hemodynamically significant stenosis identified. Tortuous carotid arteries, both of which have a retropharyngeal course. 2. Large body habitus and chronic cervical spine degeneration. 3. Chest CTA from today reported separately.   Electronically Signed   By: Genevie Ann M.D.   On: 03/27/2015 18:05   Dg Chest Portable 1 View  03/27/2015   CLINICAL DATA:  Patient has central chest pain that began on Monday after getting a flu shot patient states. Patient also states that the pain began in her posterior skull and began to radiate forward into her chest and has worsened today.  EXAM: PORTABLE CHEST 1 VIEW  COMPARISON:  10/13/2014  FINDINGS: Left-sided transvenous pacemaker leads to the right atrium and right ventricle. Heart is mildly enlarged. Mild pulmonary edema is present. There are no focal consolidations. No definite effusions. Study quality is compromised by portable technique and patient body habitus.   IMPRESSION: Mild pulmonary edema.   Electronically Signed   By: Nolon Nations M.D.   On: 03/27/2015 17:31   Ct Angio Chest Aorta W/cm &/or Wo/cm  03/27/2015   CLINICAL DATA:  Neck pain radiating to shoulders and left arm.  EXAM: CT ANGIOGRAPHY CHEST WITH CONTRAST  TECHNIQUE: Multidetector CT imaging of the chest was performed using the standard protocol during bolus administration of intravenous contrast. Multiplanar CT image reconstructions and MIPs were obtained to evaluate the vascular anatomy.  CONTRAST:  137mL OMNIPAQUE IOHEXOL 350 MG/ML SOLN  COMPARISON:  09/12/2014  FINDINGS: There is no evidence of aortic dissection, aneurysm, or intramural hematoma. The great vessels are grossly patent allowing for artifact at the thoracic inlet.  No obvious filling defect is present in the pulmonary arterial tree to suggest acute pulmonary thromboembolism. There are prominent central pulmonary arteries suggesting elevated pulmonary artery pressure  Calcified mediastinal nodes.  AICD device is in place.  No pneumothorax.  No pleural effusion.  Abnormal linear and ground-glass opacities throughout the lungs are not significantly changed. It is most prominent in the upper lung zones. This may be simply related to chronic or old granulomatous disease.  Innumerable thyroid lesions are seen bilaterally.  No acute bony deformity.  Postcholecystectomy.  Review of the MIP images confirms the above findings.  IMPRESSION: No evidence of aortic dissection or intramural hematoma.  Chronic pulmonary parenchymal disease is again noted. Interstitial lung disease or old granulomatous disease are considerations.  Multiple bilateral thyroid lesions are noted. Correlation with thyroid ultrasound is recommended.   Electronically Signed   By: Marybelle Killings M.D.   On: 03/27/2015 18:08      Management plans discussed with the patient and she is in agreement. Stable for discharge home  Patient should follow up with PCP in1  week  CODE STATUS:     Code Status Orders        Start     Ordered   03/27/15 2209  Full code   Continuous     03/27/15 2208      TOTAL TIME TAKING CARE OF THIS PATIENT: 35 minutes.    Weslyn Holsonback M.D on 03/28/2015 at 1:03 PM  Between 7am to 6pm - Pager - [phone] After 6pm go to [email or web address] - password [email or web address] [facility]  Office  [phone]  CC: Primary care physician; [name], MD

## 2015-03-28 NOTE — Progress Notes (Addendum)
Debra Saunders 69y female was admitted from the ER d/t neck pain that was radiating to her bilateral shoulder. ON admission she has her daughter at the bedside and was c/o 9/10 neck pain. PRN pain med was able to bring the pain to 6/10. Patient was kept on 3L of supplemental oxygen and later transferred to cpap.  Skin was checked with Kristine Garbe RN Patient called because her pain was not getting better, PRN pain med was administered w/o improvement in pain.One time dose of Dilaudid was administered per order. Patient was able to rest afterwards. Will continue to monitor.

## 2015-03-28 NOTE — Progress Notes (Signed)
Patient alert and oriented.  Vss.   On [facility]  Patient complaining of pain 7/10.  Cardiac reasons r/o and believed to be muscular.  RX for percocet and flexeril.  Patient agrees with discharge and will f/u with PCP and cardiologist.  No questions at this time.  Patient to be escorted out of hospital via wheelchair and nursing staff.

## 2015-03-28 NOTE — Consult Note (Signed)
Cardiology Consultation Note  Patient ID: Debra Saunders, MRN: [id number], DOB/AGE: 25-Sep-1945 69 y.o. Admit date: 03/27/2015   Date of Consult: 03/28/2015 Primary Physician: [name], MD Primary Cardiologist: Dr. Fletcher Anon, MD  Chief Complaint: Chest pain Reason for Consult: Chest pain  HPI: 69 year old female with no evidence of obstructive CAD by cardiac cath in [id number], chronic diastolic heart failure with pulmonary hypertension, chronic respiratory failure on home oxygen 2/2 COPD, symptomatic bradycardia s/p dual-chamber PPM in 12/2013, and chronic atypical chest pain thought to be due to costochondritis who presented to [facility] on 10/4 with her typical chronic chest pain.   She has chronic left lower extremity swelling which has been evaluated in the past with venous duplex ultrasound on multiple occasions and was negative for DVT. She did have a nonocclusive DVT on ultrasound last year with subsequent IVC filter placement. She likely has lymphedema on the left side as well. Her echocardiogram in 2013 showed normal LV systolic function with mild diastolic dysfunction and evidence of elevated filling pressures. There was also moderate pulmonary hypertension with peak systolic pressure of 56 mm Hg. she underwent cardiac catheterization in August of 2013 which showed no evidence of obstructive coronary artery disease. She was noted to be severely hypertensive with significant elevation in left ventricular end-diastolic pressure.   She was hospitalized in July, 2014 at [facility] for severe chest pain. Cardiac workup was overall negative. CT of the chest showed possible peripheral pulmonary emboli which really did not explain her symptoms. She was anticoagulated which was complicated by subarachnoid hemorrhage and cardiopulmonary arrest. She was transferred to [facility] and was treated conservatively. Anticoagulation was discontinued. She was discharged home but readmitted in August at [facility] with septic  shock and acute renal failure requiring brief dialysis. She ultimately improved. Renal function went back to normal.  She continued to have symptomatic bradycardia and underwent a dual-chamber pacemaker placement by Dr. Caryl Comes in July, 2015. She had significant improvement in fatigue, dyspnea and dizziness after that. Her diuretics were switched to torsemide for recent fluid overload. She reports improvement in leg edema and dyspnea.   She has had significant congestion with the weather changing. She has not had any exertional chest pain or increased SOB. No increased LEE, orthopnea, or PND. Upon her arrival to [facility] she was found to have troponin of 0.09-->0.06-->0.14, ECG sinus tachycardia, 108 bpm, consecutive PVCs, SCr 0.65, WBC 15.7, CTA chest aorta  Without evidence of aortic dissection or intramural hematoma. She was noted to have chronic parenchymal diease. CT angio neck showed mild carotid and vertebral artery atherosclerosis with no hemodynamically significant stenosis. Her BP was found to be elevated in the 170's upon arrival and has since improved to the low [id number] systolic. She continues to have reproducible chest pain to palpation.        Past Medical History  Diagnosis Date  . Thyroid disorder   . Diabetes mellitus ([place])     type II  . Thyroid nodule   . Sleep apnea   . Obesity   . Hypertension   . Hyperlipidemia   . Urinary incontinence   . CHF (congestive heart failure) ([place]) 2005    Dx at [facility]  . Chronic back pain   . COPD (chronic obstructive pulmonary disease) ([place])     2L [place]  . Sleep apnea   . Lipoma of back   . Arthritis   . Streptococcal bacteremia   . Acute renal failure ([place])   . Stroke ([place])   .  Sepsis with metabolic encephalopathy ([place])   . Gastritis   . Symptomatic bradycardia       Most Recent Cardiac Studies: As above   Surgical History:  Past Surgical History  Procedure Laterality Date  . Cholecystectomy    . Vaginal hysterectomy    . Cardiac  catheterization  2013    @ [place]: No obstructive CAD: Only 20% ostial left circumflex.  . Abdominal hysterectomy  1969  . Cyst removal trunk  2002    BACK  . Lipoma removal  2014    back  . Pacemaker insertion  2015    [place] dual chamber pacemaker implanted by Dr Caryl Comes for symptomatic bradycardia  . Insert / replace / remove pacemaker    . Permanent pacemaker insertion N/A 01/09/2014    Procedure: PERMANENT PACEMAKER INSERTION;  Surgeon: Deboraha Sprang, MD;  Location: [facility] CATH LAB;  Service: Cardiovascular;  Laterality: N/A;     Home Meds: Prior to Admission medications   Medication Sig Start Date End Date Taking? Authorizing Provider  acetaminophen (TYLENOL) 325 MG tablet Take 325-650 mg by mouth every 6 (six) hours as needed for mild pain.    Yes Historical Provider, MD  ambrisentan (LETAIRIS) 5 MG tablet Take 1 tablet (5 mg total) by mouth daily. 07/27/14  Yes Juanito Doom, MD  diazepam (VALIUM) 5 MG tablet Take 5 mg by mouth every 8 (eight) hours as needed for muscle spasms (and/or neck pain).   Yes Historical Provider, MD  glipiZIDE (GLUCOTROL) 5 MG tablet Take 0.5 tablets (2.5 mg total) by mouth daily before breakfast. 03/19/15  Yes Jackolyn Confer, MD  insulin detemir (LEVEMIR) 100 UNIT/ML injection Inject 0.4 mLs (40 Units total) into the skin at bedtime. 12/14/14  Yes Jackolyn Confer, MD  meclizine (ANTIVERT) 25 MG tablet Take 25 mg by mouth daily as needed for dizziness.   Yes Historical Provider, MD  Menthol, Topical Analgesic, (BIOFREEZE EX) Apply 1 application topically as needed (for pain).    Yes Historical Provider, MD  naproxen (EC NAPROSYN) 500 MG EC tablet Take 500 mg by mouth 2 (two) times daily as needed (for pain).   Yes Historical Provider, MD  ondansetron (ZOFRAN) 8 MG tablet Take 1 tablet (8 mg total) by mouth every 8 (eight) hours as needed for nausea or vomiting. 09/25/14  Yes Jackolyn Confer, MD  oxyCODONE-acetaminophen (PERCOCET/ROXICET) 5-325 MG  per tablet Take 1 tablet by mouth every 8 (eight) hours as needed for severe pain.    Yes Historical Provider, MD  polyethylene glycol (MIRALAX / GLYCOLAX) packet Take 17 g by mouth daily as needed for moderate constipation.   Yes Historical Provider, MD  potassium chloride SA (KLOR-CON M20) 20 MEQ tablet Take 1 tablet (20 mEq total) by mouth 2 (two) times daily. 11/09/14  Yes Jackolyn Confer, MD  promethazine (PHENERGAN) 25 MG tablet Take 25 mg by mouth every 4 (four) hours as needed for nausea or vomiting.    Yes Historical Provider, MD  torsemide (DEMADEX) 20 MG tablet Take 1 tablet (20 mg total) by mouth 2 (two) times daily. Patient taking differently: Take 20 mg by mouth daily.  07/18/14  Yes Deboraha Sprang, MD  verapamil (CALAN-SR) 240 MG CR tablet Take 240 mg by mouth daily.   Yes Historical Provider, MD  cyclobenzaprine (FLEXERIL) 5 MG tablet Take 1 tablet (5 mg total) by mouth 3 (three) times daily as needed for muscle spasms. 03/28/15   Bettey Costa, MD  Inpatient Medications:  . ambrisentan  5 mg Oral Daily  . cyclobenzaprine  5 mg Oral TID  . docusate sodium  100 mg Oral BID  . enoxaparin (LOVENOX) injection  40 mg Subcutaneous Q12H  . famotidine  20 mg Oral BID  . glipiZIDE  2.5 mg Oral QAC breakfast  . insulin aspart  0-5 Units Subcutaneous QHS  . insulin aspart  0-9 Units Subcutaneous TID WC  . insulin detemir  10 Units Subcutaneous QHS  . metoprolol tartrate  12.5 mg Oral BID  . potassium chloride SA  20 mEq Oral BID  . simvastatin  10 mg Oral q1800  . sodium chloride  3 mL Intravenous Q12H  . sodium chloride  3 mL Intravenous Q12H  . torsemide  20 mg Oral BID  . verapamil  240 mg Oral Daily      Allergies:  Allergies  Allergen Reactions  . Penicillins Hives, Shortness Of Breath, Swelling and Other (See Comments)    Has patient had a PCN reaction causing immediate rash, facial/tongue/throat swelling, SOB or lightheadedness with hypotension: Yes Has patient had a PCN  reaction causing severe rash involving mucus membranes or skin necrosis: No Has patient had a PCN reaction that required hospitalization No Has patient had a PCN reaction occurring within the last 10 years: No If all of the above answers are "NO", then may proceed with Cephalosporin use.  . Aspirin Hives and Other (See Comments)    Pt states that she is unable to take because she had bleeding in her brain.    . Other Other (See Comments)    Pt states that she is unable to take blood thinners because she had bleeding in her brain.     Social History   Social History  . Marital Status: Divorced    Spouse Name: N/A  . Number of Children: N/A  . Years of Education: N/A   Occupational History  . Retired- factory work     exposed to [place]  . Smoking status: Former Smoker -- 0.30 packs/day for 2 years    Types: Cigarettes    Quit date: 06/23/1990  . Smokeless tobacco: Never Used  . Alcohol Use: No  . Drug Use: No  . Sexual Activity: Not on file   Other Topics Concern  . Not on file   Social History Narrative   Lives in [place] with daughter. Has 4 children. No pets.      Work - Restaurant manager, fast food, retired      Diet - regular     Family History  Problem Relation Age of Onset  . Heart disease Mother   . Hypertension Mother   . COPD Mother     was a smoker  . Thyroid disease Mother   . Hypertension Father   . Hypertension Sister   . Thyroid disease Sister   . Hypertension Sister   . Thyroid disease Sister   . Breast cancer Maternal Aunt      Review of Systems: Review of Systems  Constitutional: Positive for malaise/fatigue. Negative for fever, chills, weight loss and diaphoresis.  HENT: Positive for congestion. Negative for sore throat.   Eyes: Negative for discharge and redness.  Respiratory: Positive for shortness of breath. Negative for cough, hemoptysis, sputum production and wheezing.   Cardiovascular: Positive for chest pain  and leg swelling. Negative for palpitations, orthopnea, claudication and PND.       CP reproducible to palpation  Gastrointestinal: Negative for nausea and vomiting.  Musculoskeletal: Negative for falls.  Skin: Negative for rash.  Neurological: Positive for weakness. Negative for sensory change, speech change, focal weakness and headaches.  Endo/Heme/Allergies: Does not bruise/bleed easily.  Psychiatric/Behavioral: The patient is nervous/anxious.   All other systems reviewed and are negative.   Labs:  Recent Labs  03/27/15 1615 03/27/15 2315 03/28/15 0415 03/28/15 1014  TROPONINI 0.09* 0.06* 0.09* 0.14*   Lab Results  Component Value Date   WBC 15.7* 03/28/2015   HGB 11.1* 03/28/2015   HCT 35.3 03/28/2015   MCV 83.6 03/28/2015   PLT 253 03/28/2015    Recent Labs Lab 03/28/15 0415  NA 133*  K 4.0  CL 97*  CO2 28  BUN 20  CREATININE 0.65  CALCIUM 9.0  GLUCOSE 151*   Lab Results  Component Value Date   CHOL 160 03/28/2015   HDL 48 03/28/2015   LDLCALC 91 03/28/2015   TRIG 107 03/28/2015   No results found for: DDIMER  Radiology/Studies:  Ct Angio Neck W/cm &/or Wo/cm  03/27/2015   CLINICAL DATA:  69 year old female with neck pain radiating to both shoulders and down the left arm. Symptoms for 4 days. Subsequent encounter.  EXAM: CT ANGIOGRAPHY NECK  TECHNIQUE: Multidetector CT imaging of the neck was performed using the standard protocol during bolus administration of intravenous contrast. Multiplanar CT image reconstructions and MIPs were obtained to evaluate the vascular anatomy. Carotid stenosis measurements (when applicable) are obtained utilizing NASCET criteria, using the distal internal carotid diameter as the denominator.  CONTRAST:  155mL OMNIPAQUE IOHEXOL 350 MG/ML SOLN in conjunction with chest CTA reported separately.  COMPARISON:  Cervical spine CT 03/25/2015. Carotid Doppler ultrasound 12/15/2014. Brain MRI 04/22/2013.  FINDINGS: Aortic arch: Bovine  arch configuration. Intermittent calcified plaque at the great vessel origins with no hemodynamically significant great vessel origin stenosis.  Right carotid system: Tortuous proximal right CCA. Retropharyngeal course of the right CCA. Retropharyngeal right carotid bifurcation. Mild calcified plaque in the medial right ICA bulb. No associated stenosis. Negative visible right ICA siphon.  Left carotid system: Tortuous left CCA. Retropharyngeal course of the distal left CCA and left carotid bifurcation. Mostly soft plaque in the left ICA origin resulting in less than 50 % stenosis with respect to the distal vessel. Moderately tortuous cervical left ICA. Negative visible left ICA siphon.  Vertebral arteries:Tortuous proximal right subclavian artery with a kinked appearance, otherwise no proximal subclavian stenosis. Streak and quantum mottle artifact at the right vertebral artery origin which appears grossly normal. No right vertebral artery stenosis to the skullbase. No intracranial vertebral artery stenosis. Patent vertebrobasilar junction with dominant appearing right AICA. Visible basilar artery is negative.  Mildly tortuous proximal left subclavian artery. Streak in quantum mottle artifact at the left vertebral artery origin which appears grossly normal. No vertebral artery stenosis in the neck. Mild calcified plaque in the left V4 segment with no associated stenosis. This is just proximal to the left PICA origin which remains patent. Normal left vertebrobasilar junction.  Skeleton: Large body habitus. Multilevel degenerative changes in the cervical spine. Trace anterolisthesis at C6-C7 with associated facet degeneration greater on the left. No acute osseous abnormality identified. Visualized paranasal sinuses and mastoids are clear.  Other neck: Chest findings today reported separately. Partially visible left chest cardiac pacemaker type device.  Thyromegaly, no discrete thyroid nodule identified. Larynx,  pharynx, parapharyngeal spaces, retropharyngeal space (retropharyngeal course of both carotid), sublingual space, submandibular glands, and parotid glands are within normal limits.  Negative visualized brain parenchyma. No cervical lymphadenopathy.  IMPRESSION: 1. Mild for age carotid and vertebral artery atherosclerosis with no hemodynamically significant stenosis identified. Tortuous carotid arteries, both of which have a retropharyngeal course. 2. Large body habitus and chronic cervical spine degeneration. 3. Chest CTA from today reported separately.   Electronically Signed   By: Genevie Ann M.D.   On: 03/27/2015 18:05   Ct Cervical Spine Wo Contrast  03/25/2015   CLINICAL DATA:  Severe neck pain after waking up.  EXAM: CT CERVICAL SPINE WITHOUT CONTRAST  TECHNIQUE: Multidetector CT imaging of the cervical spine was performed without intravenous contrast. Multiplanar CT image reconstructions were also generated.  COMPARISON:  None.  FINDINGS: No acute fracture, endplate erosion, or focal bone lesion. No prevertebral edema.  Mild C6-7 anterolisthesis is associated with advanced facet arthropathy, the best explanation.  No indication of acute herniation and no notable canal stenosis. Degenerative disc disease is diffuse with endplate and uncovertebral ridging from C3-4 to C5-6. Ridging causes foraminal narrowing that is in general mild, except on the right at C3-4 where it is moderate to advanced.  Chronic interstitial opacity in the apical lungs, likely accentuated by expiratory phase. Tracheal flattening suggesting tracheomalacia.  Goiter.  IMPRESSION: 1. No acute finding. 2. Disc and facet degeneration as described above.   Electronically Signed   By: Monte Fantasia M.D.   On: 03/25/2015 05:18   Dg Chest Portable 1 View  03/27/2015   CLINICAL DATA:  Patient has central chest pain that began on Monday after getting a flu shot patient states. Patient also states that the pain began in her posterior skull and  began to radiate forward into her chest and has worsened today.  EXAM: PORTABLE CHEST 1 VIEW  COMPARISON:  10/13/2014  FINDINGS: Left-sided transvenous pacemaker leads to the right atrium and right ventricle. Heart is mildly enlarged. Mild pulmonary edema is present. There are no focal consolidations. No definite effusions. Study quality is compromised by portable technique and patient body habitus.  IMPRESSION: Mild pulmonary edema.   Electronically Signed   By: Nolon Nations M.D.   On: 03/27/2015 17:31   Ct Angio Chest Aorta W/cm &/or Wo/cm  03/27/2015   CLINICAL DATA:  Neck pain radiating to shoulders and left arm.  EXAM: CT ANGIOGRAPHY CHEST WITH CONTRAST  TECHNIQUE: Multidetector CT imaging of the chest was performed using the standard protocol during bolus administration of intravenous contrast. Multiplanar CT image reconstructions and MIPs were obtained to evaluate the vascular anatomy.  CONTRAST:  149mL OMNIPAQUE IOHEXOL 350 MG/ML SOLN  COMPARISON:  09/12/2014  FINDINGS: There is no evidence of aortic dissection, aneurysm, or intramural hematoma. The great vessels are grossly patent allowing for artifact at the thoracic inlet.  No obvious filling defect is present in the pulmonary arterial tree to suggest acute pulmonary thromboembolism. There are prominent central pulmonary arteries suggesting elevated pulmonary artery pressure  Calcified mediastinal nodes.  AICD device is in place.  No pneumothorax.  No pleural effusion.  Abnormal linear and ground-glass opacities throughout the lungs are not significantly changed. It is most prominent in the upper lung zones. This may be simply related to chronic or old granulomatous disease.  Innumerable thyroid lesions are seen bilaterally.  No acute bony deformity.  Postcholecystectomy.  Review of the MIP images confirms the above findings.  IMPRESSION: No evidence of aortic dissection or intramural hematoma.  Chronic pulmonary parenchymal disease is again noted.  Interstitial lung disease or old granulomatous disease are considerations.  Multiple bilateral thyroid lesions are noted. Correlation with thyroid ultrasound is recommended.   Electronically Signed   By: Marybelle Killings M.D.   On: 03/27/2015 18:08    EKG: sinus tachycardia, 108 bpm, consecutive PVCs  Weights: Filed Weights   03/27/15 1543 03/27/15 2210  Weight: 240 lb (108.863 kg) 221 lb 8 oz (100.472 kg)     Physical Exam: Blood pressure 105/49, pulse 89, temperature 98.6 F (37 C), temperature source Oral, resp. rate 18, height 5\' 1"  (1.549 m), weight 221 lb 8 oz (100.472 kg), SpO2 93 %. Body mass index is 41.87 kg/(m^2). General: Well developed, well nourished, in no acute distress. Head: Normocephalic, atraumatic, sclera non-icteric, no xanthomas, nares are without discharge.  Neck: Negative for carotid bruits. JVD not elevated. Lungs: Clear bilaterally to auscultation without wheezes, rales, or rhonchi. Breathing is unlabored. Heart: RRR with S1 S2. II/VI systolic murmur in aortic region. No rubs, or gallops appreciated. Chest pain is easily reproducible to palpation and worse with arm movement.  Abdomen: Obese, soft, non-tender, non-distended with normoactive bowel sounds. No hepatomegaly. No rebound/guarding. No obvious abdominal masses. Msk:  Strength and tone appear normal for age. Extremities: No clubbing or cyanosis. Chronic left lower extremity edema without TTP.  Distal pedal pulses are 2+ and equal bilaterally. Neuro: Alert and oriented X 3. No facial asymmetry. No focal deficit. Moves all extremities spontaneously. Psych:  Responds to questions appropriately with a normal affect.    Assessment and Plan:  69 year old female with no evidence of obstructive CAD by cardiac cath in [id number], chronic diastolic heart failure with pulmonary hypertension, chronic respiratory failure on home oxygen 2/2 COPD, symptomatic bradycardia s/p dual-chamber PPM in 12/2013, and chronic atypical  chest pain thought to be due to costochondritis who presented to [facility] on 10/4 with her typical chronic chest pain.  1. Atypical chest pain: -Pain is very reproducible to palpation, pain even with placing the stethoscope on the patient  -No exertional symptoms -Recent ischemic evaluation in 04/2014, showing no ischemia -Pain likely secondary to her chronic costochondritis  -Recent congestion with the weather change -Would manage medically -No plans for ischemic work up at this time  2. Elevated troponin: -No exertional symptoms -Likely in the setting of her accelerated HTN and chronic diastolic CHF -Mildly elevated and flat trending -No ischemic evaluations as above  3. Chronic diastolic CHF: -Not volume overloaded at this time -Continue home medications  4. Accelerated HTN: -Improved -Continue home verapamil    Melvern Banker, PA-C Pager: [phone] 03/28/2015, 12:44 PM  I have seen, examined and evaluated the patient this PM along with Mr. Purcell Mouton.  After reviewing all the available data and chart,  I agree with his findings, examination as well as impression recommendations.  The patient was admitted with what is clearly musculoskeletal type chest pain starting on the back of her neck and around the front of her chest. Unfortunately she does have a cardiac history and troponins were checked which were mildly elevated. She was relatively hypertensive upon initial evaluation. My suspicion is that the positive troponin levels are simply demand ischemia from hypertension related to her pain. I do not think that her pain is at all anginal in nature. It has been going on for over 10 hours now. Her pain is reproducible exam and with certain movements.  No sign of volume overload from chronic diastolic heart failure. Blood pressure is now improved on her home medications. At this time I would not recommend  any further cardiac evaluation. I think she is probably stable for  discharge from a cardiac standpoint, but would require some additional pain management prior to discharge.    Leonie Man, M.D., M.S. Interventional Cardiologist   Pager # [phone]

## 2015-03-29 ENCOUNTER — Telehealth: Payer: Self-pay

## 2015-03-29 NOTE — Telephone Encounter (Signed)
Transition Care Management Follow-up Telephone Call   Date discharged? 03/27/15   How have you been since you were released from the hospital? Pain doesn't seem to be getting better, but she is currently resting.  Hard to move and sit up, refuses to eat at times, neck pain continued.    Do you understand why you were in the hospital? Yes, neck and chest pain   Do you understand the discharge instructions? Yes   Where were you discharged to? Home  Items Reviewed:  Medications reviewed: Yes  Allergies reviewed: Yes  Dietary changes reviewed: Yes  Referrals reviewed: Yes   Functional Questionnaire:   Activities of Daily Living (ADLs):   She states they are independent in the following: self feeds at times. States they require assistance with the following: bathing, dressing, toileting, meal preparation. Daughter [name]) is there to help.   Any transportation issues/concerns?: None, daughter to drive or will call [phone].   Any patient concerns? Medication doesn't relieve pain, dehydrated (she doesn't want to eat or drink because it is painful to move her body to go to the restroom).  Daughter [name]) is diapering and changing her.   Confirmed importance and date/time of follow-up visits scheduled Yes appointment made 04/04/15.  Provider Appointment booked with Dr. Gilford Rile (PCP).  Confirmed with patient if condition begins to worsen call PCP or go to the ER.  Patient was given the office number and encouraged to call back with question or concerns.  : Yes, daughter [name]) verbalized understanding.

## 2015-03-30 ENCOUNTER — Emergency Department: Payer: Commercial Managed Care - HMO

## 2015-03-30 ENCOUNTER — Inpatient Hospital Stay (HOSPITAL_COMMUNITY)
Admission: AD | Admit: 2015-03-30 | Discharge: 2015-04-03 | DRG: 556 | Disposition: A | Discharge status: Skilled Nursing Facility | Payer: Commercial Managed Care - HMO | Source: Other Acute Inpatient Hospital | Attending: Internal Medicine | Admitting: Internal Medicine

## 2015-03-30 ENCOUNTER — Emergency Department
Admission: EM | Admit: 2015-03-30 | Discharge: 2015-03-30 | Disposition: A | Discharge status: Other Acute Inpatient Hospital | Payer: Commercial Managed Care - HMO | Attending: Emergency Medicine | Admitting: Emergency Medicine

## 2015-03-30 DIAGNOSIS — M79622 Pain in left upper arm: Principal | ICD-10-CM | POA: Diagnosis present

## 2015-03-30 DIAGNOSIS — E119 Type 2 diabetes mellitus without complications: Secondary | ICD-10-CM

## 2015-03-30 DIAGNOSIS — Z950 Presence of cardiac pacemaker: Secondary | ICD-10-CM

## 2015-03-30 DIAGNOSIS — J029 Acute pharyngitis, unspecified: Secondary | ICD-10-CM | POA: Diagnosis not present

## 2015-03-30 DIAGNOSIS — R7989 Other specified abnormal findings of blood chemistry: Secondary | ICD-10-CM | POA: Diagnosis present

## 2015-03-30 DIAGNOSIS — I10 Essential (primary) hypertension: Secondary | ICD-10-CM | POA: Insufficient documentation

## 2015-03-30 DIAGNOSIS — M4802 Spinal stenosis, cervical region: Secondary | ICD-10-CM | POA: Insufficient documentation

## 2015-03-30 DIAGNOSIS — M25511 Pain in right shoulder: Secondary | ICD-10-CM

## 2015-03-30 DIAGNOSIS — I5030 Unspecified diastolic (congestive) heart failure: Secondary | ICD-10-CM | POA: Diagnosis not present

## 2015-03-30 DIAGNOSIS — J9611 Chronic respiratory failure with hypoxia: Secondary | ICD-10-CM | POA: Diagnosis not present

## 2015-03-30 DIAGNOSIS — Z79899 Other long term (current) drug therapy: Secondary | ICD-10-CM | POA: Insufficient documentation

## 2015-03-30 DIAGNOSIS — E1169 Type 2 diabetes mellitus with other specified complication: Secondary | ICD-10-CM | POA: Diagnosis not present

## 2015-03-30 DIAGNOSIS — R000 Tachycardia, unspecified: Secondary | ICD-10-CM | POA: Diagnosis not present

## 2015-03-30 DIAGNOSIS — J9621 Acute and chronic respiratory failure with hypoxia: Secondary | ICD-10-CM | POA: Diagnosis present

## 2015-03-30 DIAGNOSIS — M5010 Cervical disc disorder with radiculopathy, unspecified cervical region: Secondary | ICD-10-CM | POA: Diagnosis not present

## 2015-03-30 DIAGNOSIS — M6281 Muscle weakness (generalized): Secondary | ICD-10-CM | POA: Diagnosis not present

## 2015-03-30 DIAGNOSIS — M79621 Pain in right upper arm: Secondary | ICD-10-CM | POA: Diagnosis not present

## 2015-03-30 DIAGNOSIS — Z8673 Personal history of transient ischemic attack (TIA), and cerebral infarction without residual deficits: Secondary | ICD-10-CM | POA: Diagnosis not present

## 2015-03-30 DIAGNOSIS — Z880 Allergy status to penicillin: Secondary | ICD-10-CM | POA: Insufficient documentation

## 2015-03-30 DIAGNOSIS — M25512 Pain in left shoulder: Secondary | ICD-10-CM

## 2015-03-30 DIAGNOSIS — E876 Hypokalemia: Secondary | ICD-10-CM | POA: Diagnosis not present

## 2015-03-30 DIAGNOSIS — I509 Heart failure, unspecified: Secondary | ICD-10-CM | POA: Diagnosis not present

## 2015-03-30 DIAGNOSIS — J441 Chronic obstructive pulmonary disease with (acute) exacerbation: Secondary | ICD-10-CM | POA: Diagnosis not present

## 2015-03-30 DIAGNOSIS — G4730 Sleep apnea, unspecified: Secondary | ICD-10-CM | POA: Diagnosis present

## 2015-03-30 DIAGNOSIS — Z794 Long term (current) use of insulin: Secondary | ICD-10-CM

## 2015-03-30 DIAGNOSIS — J449 Chronic obstructive pulmonary disease, unspecified: Secondary | ICD-10-CM | POA: Diagnosis present

## 2015-03-30 DIAGNOSIS — R29898 Other symptoms and signs involving the musculoskeletal system: Secondary | ICD-10-CM

## 2015-03-30 DIAGNOSIS — R5381 Other malaise: Secondary | ICD-10-CM | POA: Diagnosis not present

## 2015-03-30 DIAGNOSIS — R531 Weakness: Secondary | ICD-10-CM | POA: Diagnosis not present

## 2015-03-30 DIAGNOSIS — M79603 Pain in arm, unspecified: Secondary | ICD-10-CM | POA: Diagnosis present

## 2015-03-30 DIAGNOSIS — R748 Abnormal levels of other serum enzymes: Secondary | ICD-10-CM | POA: Diagnosis present

## 2015-03-30 DIAGNOSIS — Z5189 Encounter for other specified aftercare: Secondary | ICD-10-CM | POA: Diagnosis not present

## 2015-03-30 DIAGNOSIS — Z86718 Personal history of other venous thrombosis and embolism: Secondary | ICD-10-CM | POA: Diagnosis not present

## 2015-03-30 DIAGNOSIS — I5032 Chronic diastolic (congestive) heart failure: Secondary | ICD-10-CM

## 2015-03-30 DIAGNOSIS — J9610 Chronic respiratory failure, unspecified whether with hypoxia or hypercapnia: Secondary | ICD-10-CM | POA: Diagnosis present

## 2015-03-30 DIAGNOSIS — Z87891 Personal history of nicotine dependence: Secondary | ICD-10-CM | POA: Insufficient documentation

## 2015-03-30 DIAGNOSIS — M5013 Cervical disc disorder with radiculopathy, cervicothoracic region: Secondary | ICD-10-CM | POA: Diagnosis not present

## 2015-03-30 DIAGNOSIS — R778 Other specified abnormalities of plasma proteins: Secondary | ICD-10-CM | POA: Diagnosis present

## 2015-03-30 DIAGNOSIS — M79602 Pain in left arm: Secondary | ICD-10-CM | POA: Diagnosis not present

## 2015-03-30 DIAGNOSIS — R52 Pain, unspecified: Secondary | ICD-10-CM | POA: Diagnosis not present

## 2015-03-30 LAB — BASIC METABOLIC PANEL
Anion gap: 13 (ref 5–15)
Anion gap: 13 (ref 5–15)
BUN: 10 mg/dL (ref 6–20)
BUN: 12 mg/dL (ref 6–20)
CO2: 28 mmol/L (ref 22–32)
CO2: 28 mmol/L (ref 22–32)
Calcium: 9.6 mg/dL (ref 8.9–10.3)
Calcium: 9.3 mg/dL (ref 8.9–10.3)
Chloride: 89 mmol/L — ABNORMAL LOW (ref 101–111)
Chloride: 90 mmol/L — ABNORMAL LOW (ref 101–111)
Creatinine, Ser: 0.6 mg/dL (ref 0.44–1.00)
Creatinine, Ser: 0.75 mg/dL (ref 0.44–1.00)
GFR calc Af Amer: >60 mL/min (ref >60)
GFR calc Af Amer: >60 mL/min (ref >60)
GFR calc non Af Amer: >60 mL/min (ref >60)
GFR calc non Af Amer: >60 mL/min (ref >60)
Glucose, Bld: 160 mg/dL — ABNORMAL HIGH (ref 65–99)
Glucose, Bld: 197 mg/dL — ABNORMAL HIGH (ref 65–99)
Potassium: 3.8 mmol/L (ref 3.5–5.1)
Potassium: 4.1 mmol/L (ref 3.5–5.1)
Sodium: 130 mmol/L — ABNORMAL LOW (ref 135–145)
Sodium: 131 mmol/L — ABNORMAL LOW (ref 135–145)

## 2015-03-30 LAB — CBC
HCT: 39.9 % (ref 36.0–46.0)
Hemoglobin: 12.9 g/dL (ref 12.0–15.0)
MCH: 27 pg (ref 26.0–34.0)
MCHC: 32.3 g/dL (ref 30.0–36.0)
MCV: 83.5 fL (ref 78.0–100.0)
Platelets: 353 10*3/uL (ref 150–400)
RBC: 4.78 MIL/uL (ref 3.87–5.11)
RDW: 13.5 % (ref 11.5–15.5)
WBC: 15.8 10*3/uL — ABNORMAL HIGH (ref 4.0–10.5)

## 2015-03-30 LAB — CBC WITH DIFFERENTIAL/PLATELET
Basophils Absolute: 0.1 10*3/uL (ref 0–0.1)
Basophils Relative: 1 %
Eosinophils Absolute: 0.1 10*3/uL (ref 0–0.7)
Eosinophils Relative: 0 %
HCT: 39 % (ref 35.0–47.0)
Hemoglobin: 12.4 g/dL (ref 12.0–16.0)
Lymphocytes Relative: 9 %
Lymphs Abs: 1.3 10*3/uL (ref 1.0–3.6)
MCH: 26.3 pg (ref 26.0–34.0)
MCHC: 31.7 g/dL — ABNORMAL LOW (ref 32.0–36.0)
MCV: 82.9 fL (ref 80.0–100.0)
Monocytes Absolute: 1.1 10*3/uL — ABNORMAL HIGH (ref 0.2–0.9)
Monocytes Relative: 8 %
Neutro Abs: 11.9 10*3/uL — ABNORMAL HIGH (ref 1.4–6.5)
Neutrophils Relative %: 82 %
Platelets: 351 10*3/uL (ref 150–440)
RBC: 4.7 MIL/uL (ref 3.80–5.20)
RDW: 13.9 % (ref 11.5–14.5)
WBC: 14.4 10*3/uL — ABNORMAL HIGH (ref 3.6–11.0)

## 2015-03-30 LAB — TROPONIN I
Troponin I: 0.13 ng/mL — ABNORMAL HIGH (ref <0.031)
Troponin I: 0.11 ng/mL — ABNORMAL HIGH (ref <0.031)

## 2015-03-30 LAB — GLUCOSE, CAPILLARY: Glucose-Capillary: 180 mg/dL — ABNORMAL HIGH (ref 65–99)

## 2015-03-30 LAB — BRAIN NATRIURETIC PEPTIDE: B Natriuretic Peptide: 101 pg/mL — ABNORMAL HIGH (ref 0.0–100.0)

## 2015-03-30 MED ORDER — DEXAMETHASONE SODIUM PHOSPHATE 4 MG/ML IJ SOLN
4.0000 mg | Freq: Four times a day (QID) | INTRAMUSCULAR | Status: DC
  Administered 2015-03-30 – 2015-04-01 (×6): 4 mg via INTRAVENOUS
  Filled 2015-03-30 (×7): qty 1

## 2015-03-30 MED ORDER — HYDROMORPHONE HCL 1 MG/ML IJ SOLN
0.5000 mg | INTRAMUSCULAR | Status: DC | PRN
  Administered 2015-03-30 – 2015-04-02 (×8): 1 mg via INTRAVENOUS
  Filled 2015-03-30 (×8): qty 1

## 2015-03-30 MED ORDER — INSULIN DETEMIR 100 UNIT/ML ~~LOC~~ SOLN
30.0000 [IU] | Freq: Every day | SUBCUTANEOUS | Status: DC
  Administered 2015-03-30 – 2015-04-02 (×4): 30 [IU] via SUBCUTANEOUS
  Filled 2015-03-30 (×5): qty 0.3

## 2015-03-30 MED ORDER — IPRATROPIUM-ALBUTEROL 0.5-2.5 (3) MG/3ML IN SOLN: 3.0000 mL | Freq: Once | RESPIRATORY_TRACT | Status: DC

## 2015-03-30 MED ORDER — PROMETHAZINE HCL 25 MG PO TABS: 25.0000 mg | ORAL_TABLET | ORAL | Status: DC | PRN

## 2015-03-30 MED ORDER — VERAPAMIL HCL ER 240 MG PO TBCR: 240.0000 mg | EXTENDED_RELEASE_TABLET | Freq: Once | ORAL | Status: DC

## 2015-03-30 MED ORDER — FENTANYL CITRATE (PF) 100 MCG/2ML IJ SOLN
50.0000 ug | Freq: Once | INTRAMUSCULAR | Status: AC
  Administered 2015-03-30: 50 ug via INTRAVENOUS
  Filled 2015-03-30: qty 2

## 2015-03-30 MED ORDER — CYCLOBENZAPRINE HCL 10 MG PO TABS
5.0000 mg | ORAL_TABLET | Freq: Three times a day (TID) | ORAL | Status: DC | PRN
  Administered 2015-03-31 – 2015-04-03 (×5): 5 mg via ORAL
  Filled 2015-03-30 (×6): qty 1

## 2015-03-30 MED ORDER — POTASSIUM CHLORIDE CRYS ER 20 MEQ PO TBCR
20.0000 meq | EXTENDED_RELEASE_TABLET | Freq: Two times a day (BID) | ORAL | Status: DC
  Administered 2015-03-30 – 2015-04-03 (×8): 20 meq via ORAL
  Filled 2015-03-30 (×8): qty 1

## 2015-03-30 MED ORDER — TORSEMIDE 20 MG PO TABS
20.0000 mg | ORAL_TABLET | Freq: Every day | ORAL | Status: DC
  Administered 2015-03-30 – 2015-04-03 (×5): 20 mg via ORAL
  Filled 2015-03-30 (×5): qty 1

## 2015-03-30 MED ORDER — MECLIZINE HCL 25 MG PO TABS: 25.0000 mg | ORAL_TABLET | Freq: Every day | ORAL | Status: DC | PRN

## 2015-03-30 MED ORDER — OXYCODONE-ACETAMINOPHEN 5-325 MG PO TABS
1.0000 | ORAL_TABLET | Freq: Three times a day (TID) | ORAL | Status: DC | PRN
  Filled 2015-03-30: qty 1

## 2015-03-30 MED ORDER — LORAZEPAM 2 MG/ML IJ SOLN
1.0000 mg | Freq: Once | INTRAMUSCULAR | Status: AC
  Administered 2015-03-30: 1 mg via INTRAVENOUS
  Filled 2015-03-30: qty 1

## 2015-03-30 MED ORDER — DEXAMETHASONE SODIUM PHOSPHATE 10 MG/ML IJ SOLN
10.0000 mg | Freq: Once | INTRAMUSCULAR | Status: AC
  Administered 2015-03-30: 10 mg via INTRAVENOUS
  Filled 2015-03-30: qty 1

## 2015-03-30 MED ORDER — ACETAMINOPHEN 325 MG PO TABS
325.0000 mg | ORAL_TABLET | Freq: Four times a day (QID) | ORAL | Status: DC | PRN
  Administered 2015-03-31 – 2015-04-03 (×4): 650 mg via ORAL
  Filled 2015-03-30 (×4): qty 2

## 2015-03-30 MED ORDER — AMBRISENTAN 5 MG PO TABS
5.0000 mg | ORAL_TABLET | Freq: Every day | ORAL | Status: DC
  Administered 2015-04-01 – 2015-04-03 (×3): 5 mg via ORAL
  Filled 2015-03-30 (×2): qty 1

## 2015-03-30 MED ORDER — HEPARIN SODIUM (PORCINE) 5000 UNIT/ML IJ SOLN
5000.0000 [IU] | Freq: Three times a day (TID) | INTRAMUSCULAR | Status: DC
  Administered 2015-03-30 – 2015-04-01 (×4): 5000 [IU] via SUBCUTANEOUS
  Filled 2015-03-30 (×5): qty 1

## 2015-03-30 MED ORDER — INSULIN ASPART 100 UNIT/ML ~~LOC~~ SOLN
0.0000 [IU] | Freq: Three times a day (TID) | SUBCUTANEOUS | Status: DC
  Administered 2015-03-31: 8 [IU] via SUBCUTANEOUS
  Administered 2015-03-31 – 2015-04-01 (×4): 5 [IU] via SUBCUTANEOUS
  Administered 2015-04-01 – 2015-04-02 (×2): 3 [IU] via SUBCUTANEOUS
  Administered 2015-04-02: 2 [IU] via SUBCUTANEOUS
  Administered 2015-04-02: 3 [IU] via SUBCUTANEOUS
  Administered 2015-04-03 (×2): 5 [IU] via SUBCUTANEOUS
  Administered 2015-04-03: 2 [IU] via SUBCUTANEOUS

## 2015-03-30 MED ORDER — VERAPAMIL HCL ER 240 MG PO TBCR
240.0000 mg | EXTENDED_RELEASE_TABLET | Freq: Every day | ORAL | Status: DC
  Administered 2015-03-30 – 2015-04-03 (×5): 240 mg via ORAL
  Filled 2015-03-30 (×5): qty 1

## 2015-03-30 MED ORDER — ONDANSETRON HCL 4 MG PO TABS: 8.0000 mg | ORAL_TABLET | Freq: Three times a day (TID) | ORAL | Status: DC | PRN

## 2015-03-30 MED ORDER — DIAZEPAM 5 MG PO TABS: 5.0000 mg | ORAL_TABLET | Freq: Three times a day (TID) | ORAL | Status: DC | PRN

## 2015-03-30 MED ORDER — POLYETHYLENE GLYCOL 3350 17 G PO PACK: 17.0000 g | PACK | Freq: Every day | ORAL | Status: DC | PRN

## 2015-03-30 MED ORDER — NAPROXEN 375 MG PO TABS
375.0000 mg | ORAL_TABLET | Freq: Two times a day (BID) | ORAL | Status: DC | PRN
  Administered 2015-04-01: 375 mg via ORAL
  Filled 2015-03-30 (×2): qty 1

## 2015-03-30 NOTE — ED Notes (Signed)
Patient resting quietly. Still having pain to back of head and neck with movement. Family at bedside. Call bell within reach.

## 2015-03-30 NOTE — ED Notes (Signed)
Patient was seen here on 10/2 and then again on 10/4. Was admitted on 10/4 and stayed overnight. Was discharged 10/5 but states medication is too strong. States she still hurts and called MD's office and was told to come here.

## 2015-03-30 NOTE — ED Notes (Signed)
[facility] here to transport patient. Verapamil not received from pharmacy. [facility] declined to wait for glucose. Patient tolerated move to stretcher well. Family will accompany patient to [facility] via private vehicle.

## 2015-03-30 NOTE — ED Provider Notes (Addendum)
[facility] Emergency Department Provider Note  ____________________________________________   I have reviewed the triage vital signs and the nursing notes.   HISTORY  Chief Complaint Arm Pain    HPI Debra Saunders is a 69 y.o. 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 Patient has had extensive workup for what appears to be a radiculopathy involving the left upper arm. She has had pain shooting from her neck and down that arm for the last week. She had a CT CTA as well as CT of the head and CT of the chest for this. She cannot have an MRI secondary to pacemaker placement. The patient's CT scan was interpreted by radiology and I have called in to talk to him further about it, there is mild to moderate canal stenosis as well as moderate to severe foraminal stenosis in the C5-C7 areas on the right and left. Patient states that yesterday when she started having weakness in her left upper extremity which was new. Now she needs help lifting off the bed. She did not have any weakness anywhere also that she feels a little bit unsteady using her right hand for fine motor. She has no loss of sensation. She has taken a home pain medications and they make her "loopy" but they do not seem to relieve the pain.   Past Medical History  Diagnosis Date  . Thyroid disorder   . Diabetes mellitus ([place])     type II  . Thyroid nodule   . Sleep apnea   . Obesity   . Hypertension   . Hyperlipidemia   . Urinary incontinence   . CHF (congestive heart failure) ([place]) 2005    Dx at [facility]  . Chronic back pain   . COPD (chronic obstructive pulmonary disease) ([place])     2L [place]  . Sleep apnea   . Lipoma of back   . Arthritis   . Streptococcal bacteremia   . Acute renal failure ([place])   .  Stroke ([place])   . Sepsis with metabolic encephalopathy ([place])   . Gastritis   . Symptomatic bradycardia   . Chronic respiratory failure (HCC)     a. on home O2    Patient Active Problem List   Diagnosis Date Noted  . Chest pain 03/27/2015  . Dysphagia, pharyngoesophageal phase 10/03/2014  . Dizziness and giddiness 09/01/2014  . Costochondritis 07/04/2014  . Elevated LFTs 06/29/2014  . Dyspnea 06/22/2014  . 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 Abnormality of gait 01/13/2014  . Sinoatrial node dysfunction ([place]) 01/09/2014  . Allergic rhinitis 10/26/2013  . Lymphedema 08/03/2013  . Constipation 04/12/2013  . Pulmonary embolus ([place]) 01/19/2013  . Subarachnoid hemorrhage ([place]) 01/19/2013  . Musculoskeletal chest pain 01/19/2013  . Convulsions/seizures ([place]) 01/19/2013  . Other convulsions 01/19/2013  . Preop cardiovascular exam 10/19/2012  . Left knee pain 10/06/2012  . Fatigue 09/27/2012  . Sinus bradycardia 08/10/2012  . Anemia 04/28/2012  . Chronic respiratory failure with hypoxia ([place]) 04/26/2012  . Diabetes mellitus type 2, controlled ([place]) 03/30/2012  . Pulmonary hypertension ([place]) 03/30/2012  . Hyperlipidemia 03/30/2012  . Screening for breast cancer 03/30/2012  . Hypothyroidism 03/30/2012  . Chronic diastolic heart failure ([place]) 01/22/2012  . Hypertension     Past Surgical History  Procedure Laterality Date  . Cholecystectomy    .  Vaginal hysterectomy    . Cardiac catheterization  2013    @ [place]: No obstructive CAD: Only 20% ostial left circumflex.  . Abdominal hysterectomy  1969  . Cyst removal trunk  2002    BACK  . Lipoma removal  2014    back  . Pacemaker insertion  2015    Cordova dual chamber pacemaker implanted by Dr Caryl Comes for symptomatic bradycardia  . Insert / replace / remove pacemaker    . Permanent pacemaker insertion N/A 01/09/2014    Procedure: PERMANENT PACEMAKER INSERTION;  Surgeon: Deboraha Sprang, MD;  Location[facility] CATH  LAB;  Service: Cardiovascular;  Laterality: N/A;    Current Outpatient Rx  Name  Route  Sig  Dispense  Refill  . acetaminophen (TYLENOL) 325 MG tablet   Oral   Take 325-650 mg by mouth every 6 (six) hours as needed for mild pain.          [name] ambrisentan (LETAIRIS) 5 MG tablet   Oral   Take 1 tablet (5 mg total) by mouth daily.   30 tablet   5   . cyclobenzaprine (FLEXERIL) 5 MG tablet   Oral   Take 1 tablet (5 mg total) by mouth 3 (three) times daily as needed for muscle spasms.   30 tablet   0   . diazepam (VALIUM) 5 MG tablet   Oral   Take 5 mg by mouth every 8 (eight) hours as needed for muscle spasms (and/or neck pain).         [name] glipiZIDE (GLUCOTROL) 5 MG tablet   Oral   Take 0.5 tablets (2.5 mg total) by mouth daily before breakfast.   60 tablet   3   . insulin detemir (LEVEMIR) 100 UNIT/ML injection   Subcutaneous   Inject 0.4 mLs (40 Units total) into the skin at bedtime.   10 mL   5   . meclizine (ANTIVERT) 25 MG tablet   Oral   Take 25 mg by mouth daily as needed for dizziness.         . Menthol, Topical Analgesic, (BIOFREEZE EX)   Topical   Apply 1 application topically as needed (for pain).          . naproxen (EC NAPROSYN) 500 MG EC tablet   Oral   Take 500 mg by mouth 2 (two) times daily as needed (for pain).         . ondansetron (ZOFRAN) 8 MG tablet   Oral   Take 1 tablet (8 mg total) by mouth every 8 (eight) hours as needed for nausea or vomiting.   30 tablet   2   . oxyCODONE-acetaminophen (PERCOCET/ROXICET) 5-325 MG tablet   Oral   Take 1 tablet by mouth every 8 (eight) hours as needed for severe pain.   30 tablet   0   . polyethylene glycol (MIRALAX / GLYCOLAX) packet   Oral   Take 17 g by mouth daily as needed for moderate constipation.         . potassium chloride SA (KLOR-CON M20) 20 MEQ tablet   Oral   Take 1 tablet (20 mEq total) by mouth 2 (two) times daily.   60 tablet   6   . promethazine (PHENERGAN) 25 MG  tablet   Oral   Take 25 mg by mouth every 4 (four) hours as needed for nausea or vomiting.          . torsemide (DEMADEX) 20 MG tablet  Oral   Take 1 tablet (20 mg total) by mouth 2 (two) times daily. Patient taking differently: Take 20 mg by mouth daily.    60 tablet   6   . verapamil (CALAN-SR) 240 MG CR tablet   Oral   Take 240 mg by mouth daily.           Allergies Penicillins; Aspirin; and Other  Family History  Problem Relation Age of Onset  . Heart disease Mother   . Hypertension Mother   . COPD Mother     was a smoker  . Thyroid disease Mother   . Hypertension Father   . Hypertension Sister   . Thyroid disease Sister   . Hypertension Sister   . Thyroid disease Sister   . Breast cancer Maternal Aunt     Social History Social History  Substance Use Topics  . Smoking status: Former Smoker -- 0.30 packs/day for 2 years    Types: Cigarettes    Quit date: 06/23/1990  . Smokeless tobacco: Never Used  . Alcohol Use: No    Review of Systems Constitutional: No fever/chills Eyes: No visual changes. ENT: No sore throat. No stiff neck no neck pain Cardiovascular: Denies chest pain. Respiratory: Denies shortness of breath. Gastrointestinal:   no vomiting.  No diarrhea.  No constipation. Genitourinary: Negative for dysuria. Musculoskeletal: Negative lower extremity swelling Skin: Negative for rash. Neurological: Negative for headaches, see history of present illness 10-point ROS otherwise negative.  ____________________________________________   PHYSICAL EXAM:  VITAL SIGNS: ED Triage Vitals  Enc Vitals Group     BP 03/30/15 1409 165/109 mmHg     Pulse Rate 03/30/15 1409 80     Resp 03/30/15 1409 18     Temp 03/30/15 1409 99.2 F (37.3 C)     Temp Source 03/30/15 1409 Oral     SpO2 03/30/15 1409 94 %     Weight 03/30/15 1409 240 lb (108.863 kg)     Height 03/30/15 1409 5\' 1"  (1.549 m)     Head Cir --      Peak Flow --      Pain Score 03/30/15  1411 10     Pain Loc --      Pain Edu? --      Excl. in [place]? --     Constitutional: Alert and oriented. Well appearing anxious and upset Eyes: Conjunctivae are normal. PERRL. EOMI. Head: Atraumatic. Nose: No congestion/rhinnorhea. Mouth/Throat: Mucous membranes are moist.  Oropharynx non-erythematous. Neck: No stridor.   Nontender with no meningismus Cardiovascular: Tachycardia noted, regular rhythm. Grossly normal heart sounds.  Good peripheral circulation. Respiratory: Normal respiratory effort.  No retractions. Lungs CTAB. Gastrointestinal: Soft and nontender. No distention. No guarding no rebound Back:  There is no focal tenderness or step off there is no midline tenderness there are no lesions noted. there is no CVA tenderness Musculoskeletal: No lower extremity tenderness. No joint effusions, no DVT signs strong distal pulses no edema Neurologic:  Normal speech and language. The pressure he has a proximal weakness, she cannot lift it off the bed her grip strength is less as well, however radial medial and ulnar nerve distribution strength is weakened but present, she has intact sensation in her bilateral upper and lower extremities, her lower extremity on the right and left both appear to be equally strong although she had baseline is quite weak and the family states that she normally does not walk much. The patient has cranial nerves II through XII grossly  intact and speech is normal..  Skin:  Skin is warm, dry and intact. No rash noted. Psychiatric: Mood and affect are normal. Speech and behavior are normal.  ____________________________________________   LABS (all labs ordered are listed, but only abnormal results are displayed)  Labs Reviewed  CBC WITH DIFFERENTIAL/PLATELET - Abnormal; Notable for the following:    WBC 14.4 (*)    MCHC 31.7 (*)    Neutro Abs 11.9 (*)    Monocytes Absolute 1.1 (*)    All other components within normal limits  BRAIN NATRIURETIC PEPTIDE -  Abnormal; Notable for the following:    B Natriuretic Peptide 101.0 (*)    All other components within normal limits  TROPONIN I - Abnormal; Notable for the following:    Troponin I 0.13 (*)    All other components within normal limits  BASIC METABOLIC PANEL - Abnormal; Notable for the following:    Sodium 130 (*)    Chloride 89 (*)    Glucose, Bld 160 (*)    All other components within normal limits   ____________________________________________  EKG   ____________________________________________  RADIOLOGY  I have personally reviewed these radiology films ____________________________________________   PROCEDURES  Procedure(s) performed: None  Critical Care performed: None  ____________________________________________   INITIAL IMPRESSION / ASSESSMENT AND PLAN / ED COURSE  Pertinent labs & imaging results that were available during my care of the patient were reviewed by me and considered in my medical decision making (see chart for details).  Patient with now weakness in the left upper extremity after a week of radicular symptoms. This is likely a progressive stenosis. I have discussed with neurosurgery, Dr. Synetta Fail who agrees with management and further evaluation the patient on an inpatient setting at [facility] however given her multiple medical problems he does feel that of lower appropriate place for her to be admitted but be the hospitalist service and I'm calling them. I did do a CT scan of her head to make sure that this wasn't a concomitant CVA although had low suspicion and that was negative. There is no ongoing change in her neurologic symptoms and she is not a candidate for TPA as I do not believe this is a CVA. The patient is anxious and upset her heart rate is mildly elevated in the low 100s, however with pain medication and comes down. After the CT scan her pain came back and I'm giving her a repeat dose of pain medications. Patient has failed outpatient  pain medications, has a progressive now weakness in her upper extremity and I do believe needs to be admitted in a facility at that is capable of taking care of this. She likely also need a myelogram. Patient and family are very much in agreement with this plan. ____________________________________________    ----------------------------------------- 5:56 PM on 03/30/2015 -----------------------------------------  Troponin noted, no evidence of ongoing change in that is been roughly the same for the last week. Did discuss with Dr. [place] Pink at [facility] who agrees with this plan and also uses steroids to help relieve symptoms as suggested by neurosurgery. We will give her Decadron, as we will have her as an inpatient facility where they can monitor her glucoses. The patient and the family are relieved by this disposition, we will continue to monitor in the emergency room. The patient will be allowed to have by mouth as there is no likely surgical intervention tonight. The patient is admitted to the hospitalist service at [place]  IMPRESSION(S) / ED DIAGNOSES  Final diagnoses:  None     Schuyler Amor, MD 03/30/15 1741  Schuyler Amor, MD 03/30/15 1757

## 2015-03-30 NOTE — H&P (Signed)
[facility] History and Physical  Debra Saunders [id number] DOB: 1946/06/22 DOA: 03/30/2015  Referring physician: EDP PCP: [name], MD   Chief Complaint: Arm pain / weakness   HPI: Debra Saunders is a 69 y.o. female with h/o multiple medical problems.  Patient presents to the ED at [facility] for LUE and RUE pain as well as now development of LUE weakness.  Pain is described as a shooting pain in both arms L > R.  Weakness is in the R arm especially in the shoulder.  She has difficulty lifting the arm.  Pain has been ongoing for the last week.  She was admitted for a cardiac rule out last week due to this pain and slightly elevated troponins.  Work up however revealed that pain was more likely due to radiculopathy in the C5-C7 area on CT scan (cannot MRI patient due to presence of pacemaker).  Weakness in LUE onset yesterday.  She has no symptoms in either lower extremity.  Patient was given steroids in ED and [facility] and possible myelogram.  Review of Systems: Systems reviewed.  As above, otherwise negative  Past Medical History  Diagnosis Date  . Thyroid disorder   . Diabetes mellitus ([place])     type II  . Thyroid nodule   . Sleep apnea   . Obesity   . Hypertension   . Hyperlipidemia   . Urinary incontinence   . CHF (congestive heart failure) ([place]) 2005    Dx at [facility]  . Chronic back pain   . COPD (chronic obstructive pulmonary disease) ([place])     2L [place]  . Sleep apnea   . Lipoma of back   . Arthritis   . Streptococcal bacteremia   . Acute renal failure ([place])   . Stroke ([place])   . Sepsis with metabolic encephalopathy ([place])   . Gastritis   . Symptomatic bradycardia   . Chronic respiratory failure (HCC)     a. on home O2   Past Surgical History  Procedure Laterality Date  . Cholecystectomy    . Vaginal hysterectomy    . Cardiac catheterization  2013    @ [place]: No obstructive CAD: Only 20% ostial left circumflex.  .  Abdominal hysterectomy  1969  . Cyst removal trunk  2002    BACK  . Lipoma removal  2014    back  . Pacemaker insertion  2015    [place] dual chamber pacemaker implanted by Dr Caryl Comes for symptomatic bradycardia  . Insert / replace / remove pacemaker    . Permanent pacemaker insertion N/A 01/09/2014    Procedure: PERMANENT PACEMAKER INSERTION;  Surgeon: Deboraha Sprang, MD;  Location: [facility] CATH LAB;  Service: Cardiovascular;  Laterality: N/A;   Social History:  reports that she quit smoking about 24 years ago. Her smoking use included Cigarettes. She has a .6 pack-year smoking history. She has never used smokeless tobacco. She reports that she does not drink alcohol or use illicit drugs.  Allergies  Allergen Reactions  . Penicillins Hives, Shortness Of Breath, Swelling and Other (See Comments)    Has patient had a PCN reaction causing immediate rash, facial/tongue/throat swelling, SOB or lightheadedness with hypotension: Yes Has patient had a PCN reaction causing severe rash involving mucus membranes or skin necrosis: No Has patient had a PCN reaction that required hospitalization No Has patient had a PCN reaction occurring within the last 10 years: No If all of the above answers  are "NO", then may proceed with Cephalosporin use.  . Aspirin Hives and Other (See Comments)    Pt states that she is unable to take because she had bleeding in her brain.    . Other Other (See Comments)    Pt states that she is unable to take blood thinners because she had bleeding in her brain.     Family History  Problem Relation Age of Onset  . Heart disease Mother   . Hypertension Mother   . COPD Mother     was a smoker  . Thyroid disease Mother   . Hypertension Father   . Hypertension Sister   . Thyroid disease Sister   . Hypertension Sister   . Thyroid disease Sister   . Breast cancer Maternal Aunt      Prior to Admission medications   Medication Sig Start Date End Date Taking?  Authorizing Provider  acetaminophen (TYLENOL) 325 MG tablet Take 325-650 mg by mouth every 6 (six) hours as needed for mild pain.     Historical Provider, MD  ambrisentan (LETAIRIS) 5 MG tablet Take 1 tablet (5 mg total) by mouth daily. 07/27/14   Juanito Doom, MD  cyclobenzaprine (FLEXERIL) 5 MG tablet Take 1 tablet (5 mg total) by mouth 3 (three) times daily as needed for muscle spasms. 03/28/15   Bettey Costa, MD  diazepam (VALIUM) 5 MG tablet Take 5 mg by mouth every 8 (eight) hours as needed for muscle spasms (and/or neck pain).    Historical Provider, MD  glipiZIDE (GLUCOTROL) 5 MG tablet Take 0.5 tablets (2.5 mg total) by mouth daily before breakfast. 03/19/15   Jackolyn Confer, MD  insulin detemir (LEVEMIR) 100 UNIT/ML injection Inject 0.4 mLs (40 Units total) into the skin at bedtime. 12/14/14   Jackolyn Confer, MD  meclizine (ANTIVERT) 25 MG tablet Take 25 mg by mouth daily as needed for dizziness.    Historical Provider, MD  Menthol, Topical Analgesic, (BIOFREEZE EX) Apply 1 application topically as needed (for pain).     Historical Provider, MD  naproxen (EC NAPROSYN) 500 MG EC tablet Take 500 mg by mouth 2 (two) times daily as needed (for pain).    Historical Provider, MD  ondansetron (ZOFRAN) 8 MG tablet Take 1 tablet (8 mg total) by mouth every 8 (eight) hours as needed for nausea or vomiting. 09/25/14   Jackolyn Confer, MD  oxyCODONE-acetaminophen (PERCOCET/ROXICET) 5-325 MG tablet Take 1 tablet by mouth every 8 (eight) hours as needed for severe pain. 03/28/15   Bettey Costa, MD  polyethylene glycol (MIRALAX / GLYCOLAX) packet Take 17 g by mouth daily as needed for moderate constipation.    Historical Provider, MD  potassium chloride SA (KLOR-CON M20) 20 MEQ tablet Take 1 tablet (20 mEq total) by mouth 2 (two) times daily. 11/09/14   Jackolyn Confer, MD  promethazine (PHENERGAN) 25 MG tablet Take 25 mg by mouth every 4 (four) hours as needed for nausea or vomiting.     Historical  Provider, MD  torsemide (DEMADEX) 20 MG tablet Take 1 tablet (20 mg total) by mouth 2 (two) times daily. Patient taking differently: Take 20 mg by mouth daily.  07/18/14   Deboraha Sprang, MD  verapamil (CALAN-SR) 240 MG CR tablet Take 240 mg by mouth daily.    Historical Provider, MD   Physical Exam: There were no vitals filed for this visit.  There were no vitals taken for this visit.  General Appearance:  Alert, oriented, mild distress, appears stated age  Head:    Normocephalic, atraumatic  Eyes:    PERRL, EOMI, sclera non-icteric        Nose:   Nares without drainage or epistaxis. Mucosa, turbinates normal  Throat:   Moist mucous membranes. Oropharynx without erythema or exudate.  Neck:   Supple. No carotid bruits.  No thyromegaly.  No lymphadenopathy.   Back:     No CVA tenderness, no spinal tenderness  Lungs:     Clear to auscultation bilaterally, without wheezes, rhonchi or rales  Chest wall:    No tenderness to palpitation  Heart:    Regular rate and rhythm without murmurs, gallops, rubs  Abdomen:     Soft, non-tender, nondistended, normal bowel sounds, no organomegaly  Genitalia:    deferred  Rectal:    deferred  Extremities:   No clubbing, cyanosis or edema.  Pulses:   2+ and symmetric all extremities  Skin:   Skin color, texture, turgor normal, no rashes or lesions  Lymph nodes:   Cervical, supraclavicular, and axillary nodes normal  Neurologic:   LUE has weakness in proximal more so than distal muscles.  Patient cannot lift L arm off of bed but does have some grip strength although slightly weak in the L hand.  Pain in both upper extremities.  BLE are at baseline strength patient says.    Labs on Admission:  Basic Metabolic Panel:  Recent Labs Lab 03/27/15 1615 03/28/15 0415 03/30/15 1442  NA 131* 133* 130*  K 3.7 4.0 3.8  CL 94* 97* 89*  CO2 26 28 28   GLUCOSE 186* 151* 160*  BUN 9 20 10   CREATININE 0.68 0.65 0.60  CALCIUM 9.1 9.0 9.6   Liver Function  Tests: No results for input(s): AST, ALT, ALKPHOS, BILITOT, PROT, ALBUMIN in the last 168 hours. No results for input(s): LIPASE, AMYLASE in the last 168 hours. No results for input(s): AMMONIA in the last 168 hours. CBC:  Recent Labs Lab 03/27/15 1615 03/28/15 0415 03/30/15 1442  WBC 15.7* 15.7* 14.4*  NEUTROABS 14.0*  --  11.9*  HGB 11.7* 11.1* 12.4  HCT 37.4 35.3 39.0  MCV 83.3 83.6 82.9  PLT 245 253 351   Cardiac Enzymes:  Recent Labs Lab 03/27/15 1615 03/27/15 2315 03/28/15 0415 03/28/15 1014 03/30/15 1442  TROPONINI 0.09* 0.06* 0.09* 0.14* 0.13*    BNP (last 3 results)  Recent Labs  05/01/14 1036 06/22/14 0952  PROBNP 44.0 45.0   CBG:  Recent Labs Lab 03/27/15 2114 03/28/15 0733 03/28/15 1137  GLUCAP 169* 152* 189*    Radiological Exams on Admission: Dg Chest 2 View  03/30/2015   CLINICAL DATA:  Congestive heart failure. Shortness of breath. Dizziness. Chest pain extending to the left arm.  EXAM: CHEST  2 VIEW  COMPARISON:  03/27/2015 and multiple previous  FINDINGS: Dual lead pacemaker appears unchanged. Heart size is normal. The aorta is unfolded. Chronic interstitial pulmonary markings are again demonstrated consistent with chronic fibrosis. No sign of new focal infiltrate, collapse or effusion.  IMPRESSION: No change.  Dual lead pacemaker.  Pulmonary fibrosis pattern.   Electronically Signed   By: [name] M.D.   On: 03/30/2015 16:18   Ct Head Wo Contrast  03/30/2015   CLINICAL DATA:  Initial encounter for left upper extremity weakness.  EXAM: CT HEAD WITHOUT CONTRAST  TECHNIQUE: Contiguous axial images were obtained from the base of the skull through the vertex without intravenous contrast.  COMPARISON:  09/12/2014.  FINDINGS: There is no evidence for acute hemorrhage, hydrocephalus, mass lesion, or abnormal extra-axial fluid collection. No definite CT evidence for acute infarction. Patchy low attenuation in the deep hemispheric and periventricular  white matter is nonspecific, but likely reflects chronic microvascular ischemic demyelination. Small hypodensities in the periventricular white matter are compatible with lacunar infarcts bilaterally and are unchanged. The visualized paranasal sinuses and mastoid air cells are clear.  IMPRESSION: No acute intracranial abnormality.  Chronic small vessel white matter ischemic disease.   Electronically Signed   By: Misty Stanley M.D.   On: 03/30/2015 16:30    EKG: Independently reviewed.  Assessment/Plan Principal Problem:   Cervical disc disorder with radiculopathy of cervical region Active Problems:   Hypertension   Diabetes mellitus type 2, controlled (HCC)   Chronic respiratory failure with hypoxia (HCC)   Elevated troponin   1. Radiculopathy - causing weakness of L arm as well as BUE pain 1. Consult NS as below 2. Pain control 3. Decadron 4mg  q6h IV 2. HTN - continue home meds 3. DM2 - 1. Reduce home lantus form 40 units to 30 units QHS 2. Add mod dose SSI AC/HS 3. May need to elevate further while on steroids 4. Chronic respiratory failure with hypoxia - continue O2 during day and CPAP at night 5. Elevated troponin - slightly elevated troponin at 0.13, will continue to trend but this was also elevated last week during CP r/o admit  Spoke with Dr. Kathyrn Sheriff who will see patient in consultation, for now he wants me to order decadron as noted above.  Code Status: Full  Family Communication: No family in room Disposition Plan: Admit to inpatient   Time spent: 10 min  GARDNER, JARED M. [facility] Pager [phone]  If 7AM-7PM, please contact the day team taking care of the patient [email or web address] Password [email or web address] 03/30/2015, 9:53 PM

## 2015-03-31 ENCOUNTER — Inpatient Hospital Stay (HOSPITAL_COMMUNITY): Payer: Commercial Managed Care - HMO

## 2015-03-31 DIAGNOSIS — R531 Weakness: Secondary | ICD-10-CM

## 2015-03-31 LAB — GLUCOSE, CAPILLARY
Glucose-Capillary: 206 mg/dL — ABNORMAL HIGH (ref 65–99)
Glucose-Capillary: 245 mg/dL — ABNORMAL HIGH (ref 65–99)
Glucose-Capillary: 258 mg/dL — ABNORMAL HIGH (ref 65–99)
Glucose-Capillary: 241 mg/dL — ABNORMAL HIGH (ref 65–99)

## 2015-03-31 LAB — TROPONIN I: Troponin I: 0.27 ng/mL — ABNORMAL HIGH (ref <0.031)

## 2015-03-31 MED ORDER — PHENOL 1.4 % MT LIQD
1.0000 | OROMUCOSAL | Status: DC | PRN
  Administered 2015-03-31: 1 via OROMUCOSAL
  Filled 2015-03-31: qty 177

## 2015-03-31 NOTE — Consult Note (Signed)
CC:  Bilateral arm pain/weakness  HPI: Debra Saunders is a 69 y.o. female admitted after multiple previous visits to the ED over the last week for bilateral arm pain, L>R. She says the pain started a little more than a week ago. She noticed pain primarily in the shoulders on both sides, especially the lateral and anterior portions of her shoulders. Pain was worse with movement of her arms. She did not initially have any N/T or weakness. She has not had any lower extremity symptoms, nor new imbalance. Over the last few days in addition to the pain she has had weakness of the arms, L>R. She has difficulty moving her shoulders on both sides, and her elbow on the left. She has been on some pain medication, muscle relaxers, and steroids since her admission last night which have helped a little with the pain.  PMH: Past Medical History  Diagnosis Date  . Thyroid disorder   . Diabetes mellitus ([place])     type II  . Thyroid nodule   . Sleep apnea   . Obesity   . Hypertension   . Hyperlipidemia   . Urinary incontinence   . CHF (congestive heart failure) ([place]) 2005    Dx at [facility]  . Chronic back pain   . COPD (chronic obstructive pulmonary disease) ([place])     2L [place]  . Sleep apnea   . Lipoma of back   . Arthritis   . Streptococcal bacteremia   . Acute renal failure ([place])   . Stroke ([place])   . Sepsis with metabolic encephalopathy ([place])   . Gastritis   . Symptomatic bradycardia   . Chronic respiratory failure (HCC)     a. on home O2    PSH: Past Surgical History  Procedure Laterality Date  . Cholecystectomy    . Vaginal hysterectomy    . Cardiac catheterization  2013    @ [place]: No obstructive CAD: Only 20% ostial left circumflex.  . Abdominal hysterectomy  1969  . Cyst removal trunk  2002    BACK  . Lipoma removal  2014    back  . Pacemaker insertion  2015    [place] dual chamber pacemaker implanted by Dr Caryl Comes for symptomatic bradycardia  . Insert / replace / remove  pacemaker    . Permanent pacemaker insertion N/A 01/09/2014    Procedure: PERMANENT PACEMAKER INSERTION;  Surgeon: Deboraha Sprang, MD;  Location: [facility] CATH LAB;  Service: Cardiovascular;  Laterality: N/A;    SH: Social History  Substance Use Topics  . Smoking status: Former Smoker -- 0.30 packs/day for 2 years    Types: Cigarettes    Quit date: 06/23/1990  . Smokeless tobacco: Never Used  . Alcohol Use: No    MEDS: Prior to Admission medications   Medication Sig Start Date End Date Taking? Authorizing Provider  acetaminophen (TYLENOL) 325 MG tablet Take 325-650 mg by mouth every 6 (six) hours as needed for mild pain.     Historical Provider, MD  ambrisentan (LETAIRIS) 5 MG tablet Take 1 tablet (5 mg total) by mouth daily. 07/27/14   Juanito Doom, MD  cyclobenzaprine (FLEXERIL) 5 MG tablet Take 1 tablet (5 mg total) by mouth 3 (three) times daily as needed for muscle spasms. 03/28/15   Bettey Costa, MD  diazepam (VALIUM) 5 MG tablet Take 5 mg by mouth every 8 (eight) hours as needed for muscle spasms (and/or neck pain).    Historical Provider, MD  glipiZIDE (GLUCOTROL)  5 MG tablet Take 0.5 tablets (2.5 mg total) by mouth daily before breakfast. 03/19/15   Jackolyn Confer, MD  insulin detemir (LEVEMIR) 100 UNIT/ML injection Inject 0.4 mLs (40 Units total) into the skin at bedtime. 12/14/14   Jackolyn Confer, MD  meclizine (ANTIVERT) 25 MG tablet Take 25 mg by mouth daily as needed for dizziness.    Historical Provider, MD  Menthol, Topical Analgesic, (BIOFREEZE EX) Apply 1 application topically as needed (for pain).     Historical Provider, MD  naproxen (EC NAPROSYN) 500 MG EC tablet Take 500 mg by mouth 2 (two) times daily as needed (for pain).    Historical Provider, MD  ondansetron (ZOFRAN) 8 MG tablet Take 1 tablet (8 mg total) by mouth every 8 (eight) hours as needed for nausea or vomiting. 09/25/14   Jackolyn Confer, MD  oxyCODONE-acetaminophen (PERCOCET/ROXICET) 5-325 MG tablet Take 1  tablet by mouth every 8 (eight) hours as needed for severe pain. 03/28/15   Bettey Costa, MD  polyethylene glycol (MIRALAX / GLYCOLAX) packet Take 17 g by mouth daily as needed for moderate constipation.    Historical Provider, MD  potassium chloride SA (KLOR-CON M20) 20 MEQ tablet Take 1 tablet (20 mEq total) by mouth 2 (two) times daily. 11/09/14   Jackolyn Confer, MD  promethazine (PHENERGAN) 25 MG tablet Take 25 mg by mouth every 4 (four) hours as needed for nausea or vomiting.     Historical Provider, MD  torsemide (DEMADEX) 20 MG tablet Take 1 tablet (20 mg total) by mouth 2 (two) times daily. Patient taking differently: Take 20 mg by mouth daily.  07/18/14   Deboraha Sprang, MD  verapamil (CALAN-SR) 240 MG CR tablet Take 240 mg by mouth daily.    Historical Provider, MD    ALLERGY: Allergies  Allergen Reactions  . Penicillins Hives, Shortness Of Breath, Swelling and Other (See Comments)    Has patient had a PCN reaction causing immediate rash, facial/tongue/throat swelling, SOB or lightheadedness with hypotension: Yes Has patient had a PCN reaction causing severe rash involving mucus membranes or skin necrosis: No Has patient had a PCN reaction that required hospitalization No Has patient had a PCN reaction occurring within the last 10 years: No If all of the above answers are "NO", then may proceed with Cephalosporin use.  . Aspirin Hives and Other (See Comments)    Pt states that she is unable to take because she had bleeding in her brain.    . Other Other (See Comments)    Pt states that she is unable to take blood thinners because she had bleeding in her brain.     ROS: Review of Systems  Constitutional: Negative for fever and chills.  HENT: Positive for congestion.   Eyes: Negative for blurred vision.  Respiratory: Positive for shortness of breath. Negative for cough and hemoptysis.   Cardiovascular: Positive for chest pain and orthopnea.  Gastrointestinal: Negative for  heartburn, nausea and vomiting.  Genitourinary: Negative for dysuria.  Musculoskeletal: Positive for myalgias, joint pain and neck pain.  Skin: Negative for rash.  Neurological: Positive for focal weakness. Negative for dizziness, tingling and headaches.  Endo/Heme/Allergies: Does not bruise/bleed easily.    NEUROLOGIC EXAM: Awake, alert, oriented Memory and concentration grossly intact Speech fluent, appropriate CN grossly intact Motor exam: Upper Extremities Deltoid Bicep Tricep Grip  Right 2/5 4/5 5/5 4/5  Left 0/5 1/5 3/5 3/5   Lower Extremity IP Quad PF DF EHL  Right  5/5 5/5 5/5 5/5 5/5  Left 5/5 5/5 5/5 5/5 5/5   Sensation grossly intact to LT (+) Hoffman's on left Toes withdraw bilaterally Pain with passive ROM on bilateral shoulder abduction TTP of bilateral AC joints  IMGAING: CT Cspine was reviewed which does demonstrate multilevel spondylosis, although no high-grade central stenosis is seen. She does have bilateral foraminal stenosis which is relatively mild appearing at C3-4, C4-5, and C5-6.   IMPRESSION: - 69 y.o. female with fairly rapid onset of pain and weakness of bilateral shoulders. Exam does demonstrate shoulder weakness, left bicep weakness, as well as some element of left tricep and bilateral grip weakness as well. She also has pain on ROM of both shoulders with TTP which would be unusual for cervical radiculopathy. In addition, the presence of weakness in multiple myotomes and (+) Hoffman's suggests a myelopathy rather than radiculopathy however recent CT Cspine did not demonstrate high-grade spinal stenosis.  Overall, the etiology of her pain and weakness is not at all clear. While cervical radiculopathy/myelopathy are possible, I certainly think investigation of possible musculoskeletal shoulder pathology would be reasonable.  Unfortunately, the patient cannot get MRI due to the pacemaker.  PLAN: - Will order bilateral shoulder XR to start. - May  consider orthopaedic consult for shoulder pain to possibly r/o shoulder pathology as the cause prior to proceeding with CT myelogram of the cervical spine.

## 2015-03-31 NOTE — Progress Notes (Signed)
   03/31/15 0042  BiPAP/CPAP/SIPAP  BiPAP/CPAP/SIPAP Pt Type Adult  Mask Type Nasal mask  Mask Size Medium  Set Rate 0 breaths/min  Respiratory Rate 16 breaths/min  IPAP 10 cmH20  EPAP 10 cmH2O  Oxygen Percent 28 %  Flow Rate 3 lpm  BiPAP/CPAP/SIPAP CPAP  Patient Home Equipment No  Auto Titrate No  BiPAP/CPAP /SiPAP Vitals  Pulse Rate 100

## 2015-03-31 NOTE — Progress Notes (Signed)
PROGRESS NOTE  MAVEN VENCILL [id number] DOB: Feb 16, 1946 DOA: 03/30/2015 PCP: [name], MD   HPI: 69 y.o. female with h/o multiple medical problems. Patient presents to the ED at [facility] for LUE and RUE pain as well as now development of LUE weakness. Pain is described as a shooting pain in both arms L > R. Weakness is in the L arm especially in the shoulder. She was transferred from [facility] here on 10/7 for neurosurgical consult.   Subjective / 24 H Interval events - complains of neck pain - denies dyspnea/chest pressure, abdominal pain, nausea  Assessment/Plan: Principal Problem:   Cervical disc disorder with radiculopathy of cervical region Active Problems:   Hypertension   Diabetes mellitus type 2, controlled (HCC)   Chronic respiratory failure with hypoxia (HCC)   Elevated troponin   Weakness    Radiculopathy - causing weakness of L arm as well as BUE pain - Consulted NS, appreciate input - Pain control - Decadron 4mg  q6h IV  HTN  - continue home meds  DM2  - continue Lantus / SSI - May need to elevate further while on steroids  Chronic respiratory failure with hypoxia  - continue O2 during day and CPAP at night  Elevated troponin  - minimally elevated, flat, also elevated last week during CP r/o admit at [facility], cardiology evaluated patient 3 days ago, did not recommend further ischemic workup  LLE DVT - chronic, patient was on Xarelto however developed small acute frontal subarachnoid hemorrhage in 2014 and is now s/p IVC filter and on no anticoagulation   Diet: Diet Carb Modified Fluid consistency:: Thin; Room service appropriate?: Yes Fluids: none  DVT Prophylaxis: heparin  Code Status: Full Code Family Communication: no family bedside  Disposition Plan: pending neurosurgery evaluation  Consultants:  Neurosurgery   Procedures:  None    Antibiotics  Anti-infectives    None       Studies  Dg Chest 2 View  03/30/2015    CLINICAL DATA:  Congestive heart failure. Shortness of breath. Dizziness. Chest pain extending to the left arm.  EXAM: CHEST  2 VIEW  COMPARISON:  03/27/2015 and multiple previous  FINDINGS: Dual lead pacemaker appears unchanged. Heart size is normal. The aorta is unfolded. Chronic interstitial pulmonary markings are again demonstrated consistent with chronic fibrosis. No sign of new focal infiltrate, collapse or effusion.  IMPRESSION: No change.  Dual lead pacemaker.  Pulmonary fibrosis pattern.   Electronically Signed   By: [name] M.D.   On: 03/30/2015 16:18   Ct Head Wo Contrast  03/30/2015   CLINICAL DATA:  Initial encounter for left upper extremity weakness.  EXAM: CT HEAD WITHOUT CONTRAST  TECHNIQUE: Contiguous axial images were obtained from the base of the skull through the vertex without intravenous contrast.  COMPARISON:  09/12/2014.  FINDINGS: There is no evidence for acute hemorrhage, hydrocephalus, mass lesion, or abnormal extra-axial fluid collection. No definite CT evidence for acute infarction. Patchy low attenuation in the deep hemispheric and periventricular white matter is nonspecific, but likely reflects chronic microvascular ischemic demyelination. Small hypodensities in the periventricular white matter are compatible with lacunar infarcts bilaterally and are unchanged. The visualized paranasal sinuses and mastoid air cells are clear.  IMPRESSION: No acute intracranial abnormality.  Chronic small vessel white matter ischemic disease.   Electronically Signed   By: Misty Stanley M.D.   On: 03/30/2015 16:30    Objective  Filed Vitals:   03/30/15 2315 03/30/15 2340 03/31/15 [id number] 03/31/15 [id number]  BP: 159/96 167/94  152/87  Pulse:   100   Temp:  99 F (37.2 C)  98.6 F (37 C)  TempSrc:    Oral  Resp:    19  SpO2:        Intake/Output Summary (Last 24 hours) at 03/31/15 1131 Last data filed at 03/31/15 0547  Gross per 24 hour  Intake    240 ml  Output    400 ml  Net    -160 ml   There were no vitals filed for this visit.  Exam:  GENERAL: NAD  HEENT: head NCAT, no scleral icterus. Pupils round and reactive. Mucous membranes are moist. Posterior pharynx clear of any exudate or lesions.  NECK: Supple. No LAD  LUNGS: Clear to auscultation. No wheezing or crackles  HEART: Regular rate and rhythm without murmur. 2+ pulses, no JVD, peripheral edema on left leg  ABDOMEN: Soft, non-distended, non-tender. Positive bowel sounds.  EXTREMITIES: Without any cyanosis or clubbing. Good muscle tone  NEUROLOGIC: Alert and oriented x3. Cranial nerves II through XII are grossly intact. Strength 3/5 on left, 5/5 on right.    Data Reviewed: Basic Metabolic Panel:  Recent Labs Lab 03/27/15 1615 03/28/15 0415 03/30/15 1442 03/30/15 2211  NA 131* 133* 130* 131*  K 3.7 4.0 3.8 4.1  CL 94* 97* 89* 90*  CO2 26 28 28 28   GLUCOSE 186* 151* 160* 197*  BUN 9 20 10 12   CREATININE 0.68 0.65 0.60 0.75  CALCIUM 9.1 9.0 9.6 9.3   CBC:  Recent Labs Lab 03/27/15 1615 03/28/15 0415 03/30/15 1442 03/30/15 2211  WBC 15.7* 15.7* 14.4* 15.8*  NEUTROABS 14.0*  --  11.9*  --   HGB 11.7* 11.1* 12.4 12.9  HCT 37.4 35.3 39.0 39.9  MCV 83.3 83.6 82.9 83.5  PLT 245 253 351 353   Cardiac Enzymes:  Recent Labs Lab 03/28/15 0415 03/28/15 1014 03/30/15 1442 03/30/15 2211 03/31/15 0356  TROPONINI 0.09* 0.14* 0.13* 0.11* 0.27*   BNP (last 3 results)  Recent Labs  03/30/15 1442  BNP 101.0*   ProBNP (last 3 results)  Recent Labs  05/01/14 1036 06/22/14 0952  PROBNP 44.0 45.0   CBG:  Recent Labs Lab 03/27/15 2114 03/28/15 0733 03/28/15 1137 03/30/15 2147 03/31/15 0746  GLUCAP 169* 152* 189* 180* 206*   Scheduled Meds: . ambrisentan  5 mg Oral Daily  . dexamethasone  4 mg Intravenous 4 times per day  . heparin  5,000 Units Subcutaneous 3 times per day  . insulin aspart  0-15 Units Subcutaneous TID WC  . insulin detemir  30 Units Subcutaneous  QHS  . potassium chloride SA  20 mEq Oral BID  . torsemide  20 mg Oral Daily  . verapamil  240 mg Oral Daily   Continuous Infusions:   Marzetta Board, MD [facility] Pager [phone]. If 7 PM - 7 AM, please contact night-coverage at [email or web address], password [facility] 03/31/2015, 11:31 AM  LOS: 1 day

## 2015-04-01 DIAGNOSIS — M5010 Cervical disc disorder with radiculopathy, unspecified cervical region: Secondary | ICD-10-CM

## 2015-04-01 DIAGNOSIS — J029 Acute pharyngitis, unspecified: Secondary | ICD-10-CM

## 2015-04-01 DIAGNOSIS — R29898 Other symptoms and signs involving the musculoskeletal system: Secondary | ICD-10-CM

## 2015-04-01 LAB — MAGNESIUM: Magnesium: 2.3 mg/dL (ref 1.7–2.4)

## 2015-04-01 LAB — INFLUENZA PANEL BY PCR (TYPE A & B)
H1N1 flu by pcr: NOT DETECTED
Influenza A By PCR: NEGATIVE
Influenza B By PCR: NEGATIVE

## 2015-04-01 LAB — C-REACTIVE PROTEIN: CRP: 5.5 mg/dL — ABNORMAL HIGH (ref <1.0)

## 2015-04-01 LAB — GLUCOSE, CAPILLARY
Glucose-Capillary: 203 mg/dL — ABNORMAL HIGH (ref 65–99)
Glucose-Capillary: 246 mg/dL — ABNORMAL HIGH (ref 65–99)
Glucose-Capillary: 184 mg/dL — ABNORMAL HIGH (ref 65–99)
Glucose-Capillary: 185 mg/dL — ABNORMAL HIGH (ref 65–99)

## 2015-04-01 LAB — CK: Total CK: 42 U/L (ref 38–234)

## 2015-04-01 LAB — RAPID STREP SCREEN (MED CTR MEBANE ONLY): Streptococcus, Group A Screen (Direct): NEGATIVE

## 2015-04-01 LAB — SEDIMENTATION RATE: Sed Rate: 102 mm/hr — ABNORMAL HIGH (ref 0–22)

## 2015-04-01 MED ORDER — DEXAMETHASONE SODIUM PHOSPHATE 4 MG/ML IJ SOLN
4.0000 mg | Freq: Two times a day (BID) | INTRAMUSCULAR | Status: DC
  Administered 2015-04-01 – 2015-04-03 (×4): 4 mg via INTRAVENOUS
  Filled 2015-04-01 (×4): qty 1

## 2015-04-01 NOTE — Progress Notes (Signed)
PROGRESS NOTE  Debra Saunders [id number] DOB: 04-12-1946 DOA: 03/30/2015 PCP: [name], MD   HPI: 69 y.o. female with h/o multiple medical problems. Patient presents to the ED at [facility] for LUE and RUE pain as well as now development of LUE weakness. Pain is described as a shooting pain in both arms L > R. Weakness is in the L arm especially in the shoulder. She was transferred from [facility] here on 10/7 for neurosurgical consult.   Subjective / 24 H Interval events - worsening of her sore throat - feels like her left arm and left leg are even weaker today  Assessment/Plan: Principal Problem:   Cervical disc disorder with radiculopathy of cervical region Active Problems:   Hypertension   Diabetes mellitus type 2, controlled (HCC)   Chronic respiratory failure with hypoxia (HCC)   Elevated troponin   Weakness  Radiculopathy / left sided weakness - neurosurgery evaluated patient, I discussed with Dr. Kathyrn Sheriff this morning, he doesn't feel strongly as this is caused by radiculopathy, I agree. She has C spine pathology as per CT scan on 10/2 however difficult to explain her symptoms. She is quite weak in her left leg as well - I have discussed with Neurology who will see pt in consultation, appreciate input.  - continue Decadron, will taper as this does not seem to be helping  Sore throat  - with some generalized symptoms, will r/o flu and strep throat  HTN  - continue home meds, BP normal this morning  DM2  - continue Lantus / SSI - CBGs fair a bit above goal, steroids will be decreased today, continue current regimen   Chronic respiratory failure with hypoxia  - continue O2 during day and CPAP at night  Elevated troponin  - minimally elevated, flat, also elevated last week during CP r/o admit at [facility], cardiology evaluated patient 3 days ago, did not recommend further ischemic workup  LLE DVT - chronic, patient was on Xarelto however developed small  acute frontal subarachnoid hemorrhage in 2014 and is now s/p IVC filter and on no anticoagulation   Diet: Diet Carb Modified Fluid consistency:: Thin; Room service appropriate?: Yes Fluids: none  DVT Prophylaxis: heparin  Code Status: Full Code Family Communication: no family bedside  Disposition Plan: home when ready. PT consult.  Consultants:  Neurosurgery   Procedures:  None    Antibiotics  Anti-infectives    None       Studies  Dg Chest 2 View  03/30/2015   CLINICAL DATA:  Congestive heart failure. Shortness of breath. Dizziness. Chest pain extending to the left arm.  EXAM: CHEST  2 VIEW  COMPARISON:  03/27/2015 and multiple previous  FINDINGS: Dual lead pacemaker appears unchanged. Heart size is normal. The aorta is unfolded. Chronic interstitial pulmonary markings are again demonstrated consistent with chronic fibrosis. No sign of new focal infiltrate, collapse or effusion.  IMPRESSION: No change.  Dual lead pacemaker.  Pulmonary fibrosis pattern.   Electronically Signed   By: [name] M.D.   On: 03/30/2015 16:18   Dg Shoulder Right  03/31/2015   CLINICAL DATA:  Acute right shoulder pain.  EXAM: RIGHT SHOULDER - 2+ VIEW  COMPARISON:  None.  FINDINGS: No fracture, Hill-Sachs deformity, dislocation or suspicious focal osseous lesion in the right shoulder. 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 Right glenohumeral joint appears grossly normal on these limited views. No pathologic soft tissue calcifications.  IMPRESSION: 1. No fracture or dislocation in the right shoulder.  2. Mild right AC joint osteoarthritis.   Electronically Signed   By: [name] M.D.   On: 03/31/2015 15:00   Ct Head Wo Contrast  03/30/2015   CLINICAL DATA:  Initial encounter for left upper extremity weakness.  EXAM: CT HEAD WITHOUT CONTRAST  TECHNIQUE: Contiguous axial images were obtained from the base of the skull through the vertex without intravenous contrast.  COMPARISON:   09/12/2014.  FINDINGS: There is no evidence for acute hemorrhage, hydrocephalus, mass lesion, or abnormal extra-axial fluid collection. No definite CT evidence for acute infarction. Patchy low attenuation in the deep hemispheric and periventricular white matter is nonspecific, but likely reflects chronic microvascular ischemic demyelination. Small hypodensities in the periventricular white matter are compatible with lacunar infarcts bilaterally and are unchanged. The visualized paranasal sinuses and mastoid air cells are clear.  IMPRESSION: No acute intracranial abnormality.  Chronic small vessel white matter ischemic disease.   Electronically Signed   By: Misty Stanley M.D.   On: 03/30/2015 16:30   Dg Shoulder Left  03/31/2015   CLINICAL DATA:  Acute shoulder pain 9 days ago without trauma.  EXAM: LEFT SHOULDER - 2+ VIEW  COMPARISON:  None.  FINDINGS: Glenohumeral joint is normal. No degeneration or subluxation. Normal humeral acromial distance. AC joint appears normal.  IMPRESSION: Negative.   Electronically Signed   By: [name] M.D.   On: 03/31/2015 15:32    Objective  Filed Vitals:   03/31/15 1523 03/31/15 2040 04/01/15 0010 04/01/15 0450  BP: 162/79 143/79  130/86  Pulse: 84 81 84 80  Temp: 98.5 F (36.9 C) 98.3 F (36.8 C)  97.4 F (36.3 C)  TempSrc: Oral Oral  Oral  Resp: 24     Weight:    103.511 kg (228 lb 3.2 oz)  SpO2: 89% 93% 92% 98%    Intake/Output Summary (Last 24 hours) at 04/01/15 1025 Last data filed at 04/01/15 0754  Gross per 24 hour  Intake    120 ml  Output   1050 ml  Net   -930 ml   Filed Weights   04/01/15 0450  Weight: 103.511 kg (228 lb 3.2 oz)    Exam:  GENERAL: NAD  HEENT: head NCAT, no scleral icterus. Pupils round and reactive. Mucous membranes are moist. Posterior pharynx with erythema  NECK: Supple. No LAD  LUNGS: Clear to auscultation. No wheezing or crackles  HEART: Regular rate and rhythm without murmur. 2+ pulses, no JVD, peripheral  edema on left leg  ABDOMEN: Soft, non-distended, non-tender. Positive bowel sounds.  EXTREMITIES: Without any cyanosis or clubbing. Good muscle tone  NEUROLOGIC: Alert and oriented x3. Cranial nerves II through XII are grossly intact. Strength 2-3/5 on left upper and lower, 5/5 on right.    Data Reviewed: Basic Metabolic Panel:  Recent Labs Lab 03/27/15 1615 03/28/15 0415 03/30/15 1442 03/30/15 2211  NA 131* 133* 130* 131*  K 3.7 4.0 3.8 4.1  CL 94* 97* 89* 90*  CO2 26 28 28 28   GLUCOSE 186* 151* 160* 197*  BUN 9 20 10 12   CREATININE 0.68 0.65 0.60 0.75  CALCIUM 9.1 9.0 9.6 9.3   CBC:  Recent Labs Lab 03/27/15 1615 03/28/15 0415 03/30/15 1442 03/30/15 2211  WBC 15.7* 15.7* 14.4* 15.8*  NEUTROABS 14.0*  --  11.9*  --   HGB 11.7* 11.1* 12.4 12.9  HCT 37.4 35.3 39.0 39.9  MCV 83.3 83.6 82.9 83.5  PLT 245 253 351 353   Cardiac Enzymes:  Recent Labs  Lab 03/28/15 0415 03/28/15 1014 03/30/15 1442 03/30/15 2211 03/31/15 0356  TROPONINI 0.09* 0.14* 0.13* 0.11* 0.27*   BNP (last 3 results)  Recent Labs  03/30/15 1442  BNP 101.0*   ProBNP (last 3 results)  Recent Labs  05/01/14 1036 06/22/14 0952  PROBNP 44.0 45.0   CBG:  Recent Labs Lab 03/31/15 0746 03/31/15 1129 03/31/15 1644 03/31/15 2042 04/01/15 0736  GLUCAP 206* 245* 258* 241* 203*   Scheduled Meds: . ambrisentan  5 mg Oral Daily  . dexamethasone  4 mg Intravenous 4 times per day  . heparin  5,000 Units Subcutaneous 3 times per day  . insulin aspart  0-15 Units Subcutaneous TID WC  . insulin detemir  30 Units Subcutaneous QHS  . potassium chloride SA  20 mEq Oral BID  . torsemide  20 mg Oral Daily  . verapamil  240 mg Oral Daily   Continuous Infusions:   Marzetta Board, MD [facility] Pager [phone]. If 7 PM - 7 AM, please contact night-coverage at [email or web address], password [facility] 04/01/2015, 10:25 AM  LOS: 2 days

## 2015-04-01 NOTE — Progress Notes (Signed)
PT Cancellation Note  Patient Details Name: Debra Saunders MRN: [id number] DOB: April 06, 1946   Cancelled Treatment:    Reason Eval/Treat Not Completed: Other (comment) (patient wants to wait for neuro consult and OT)   Shanna Cisco 04/01/2015, 1:43 PM

## 2015-04-01 NOTE — Consult Note (Addendum)
NEURO HOSPITALIST CONSULT NOTE   Referring physician: Dr Shellia Cleverly Reason for Consult: new onset bilateral shoulder-upper ext pain-weakness and left LE weakness  HPI:                                                                                                                                          Debra Saunders is an 69 y.o. female with a past medical history significant for HTN, hyperlipidemia, DM, chronic congestive heart failure, stroke without residual deficits, OSA, COPD, chronic back pain, admitted to [facility] for evaluation of the above stated symptoms. Patient initially presented to the ED at [facility] for LUE and RUE pain as well as now development of LUE weakness and was as transferred from [facility] here on 10/7 for neurosurgical consult. [name] indicated that this past Saturday she developed rather abrupt onset of neck stiffness that rapidly progressed to a severe sharp pain in the base of the neck and subsequently started " shooting to the shoulders", especially the lateral and anterior portions of her shoulders. She stated that the pain has been constant ever since, worsens with movements or her arms or neck, and yesterday the left arm.right arm became weak to the point that she can not lift the left arm off the bed. In addition, she is noticing that her leg leg is also weak. Complains of HA but denies vertigo, double vision, or visual impairment. She is receiving pain medications, muscle relaxers, and steroids since her admission which have helped a little to control the pain. CT head was personally reviewed and showed no acute abnormality. CT spine does demonstrate multilevel spondylosis, mild bilateral foraminal stenosis at C3-4, C4-5, and C5-6, no cord involvement.  Total CK 42 X ray shoulders unremarkable.  Past Medical History  Diagnosis Date  . Thyroid disorder   . Diabetes mellitus ([place])     type II  . Thyroid nodule   . Sleep apnea   . Obesity   .  Hypertension   . Hyperlipidemia   . Urinary incontinence   . CHF (congestive heart failure) ([place]) 2005    Dx at [facility]  . Chronic back pain   . COPD (chronic obstructive pulmonary disease) ([place])     2L [place]  . Sleep apnea   . Lipoma of back   . Arthritis   . Streptococcal bacteremia   . Acute renal failure ([place])   . Stroke ([place])   . Sepsis with metabolic encephalopathy ([place])   . Gastritis   . Symptomatic bradycardia   . Chronic respiratory failure (HCC)     a. on home O2    Past Surgical History  Procedure Laterality Date  . Cholecystectomy    . Vaginal hysterectomy    . Cardiac catheterization  2013    @  [facility]: No obstructive CAD: Only 20% ostial left circumflex.  . Abdominal hysterectomy  1969  . Cyst removal trunk  2002    BACK  . Lipoma removal  2014    back  . Pacemaker insertion  2015    [place] dual chamber pacemaker implanted by Dr Caryl Comes for symptomatic bradycardia  . Insert / replace / remove pacemaker    . Permanent pacemaker insertion N/A 01/09/2014    Procedure: PERMANENT PACEMAKER INSERTION;  Surgeon: Deboraha Sprang, MD;  Location: [facility] CATH LAB;  Service: Cardiovascular;  Laterality: N/A;    Family History  Problem Relation Age of Onset  . Heart disease Mother   . Hypertension Mother   . COPD Mother     was a smoker  . Thyroid disease Mother   . Hypertension Father   . Hypertension Sister   . Thyroid disease Sister   . Hypertension Sister   . Thyroid disease Sister   . Breast cancer Maternal Aunt     Family History: no MS, brain tumor, or epilepsy   Social History:  reports that she quit smoking about 24 years ago. Her smoking use included Cigarettes. She has a .6 pack-year smoking history. She has never used smokeless tobacco. She reports that she does not drink alcohol or use illicit drugs.  Allergies  Allergen Reactions  . Penicillins Hives, Shortness Of Breath, Swelling and Other (See Comments)    Has patient had a PCN reaction causing  immediate rash, facial/tongue/throat swelling, SOB or lightheadedness with hypotension: Yes Has patient had a PCN reaction causing severe rash involving mucus membranes or skin necrosis: No Has patient had a PCN reaction that required hospitalization No Has patient had a PCN reaction occurring within the last 10 years: No If all of the above answers are "NO", then may proceed with Cephalosporin use.  . Aspirin Hives and Other (See Comments)    Pt states that she is unable to take because she had bleeding in her brain.    . Other Other (See Comments)    Pt states that she is unable to take blood thinners because she had bleeding in her brain.     MEDICATIONS:                                                                                                                     Scheduled: . ambrisentan  5 mg Oral Daily  . dexamethasone  4 mg Intravenous Q12H  . insulin aspart  0-15 Units Subcutaneous TID WC  . insulin detemir  30 Units Subcutaneous QHS  . potassium chloride SA  20 mEq Oral BID  . torsemide  20 mg Oral Daily  . verapamil  240 mg Oral Daily     ROS:  History obtained from chart review and the patient  General ROS: negative for - chills, fatigue, fever, night sweats, or weight loss Psychological ROS: negative for - behavioral disorder, hallucinations, memory difficulties, mood swings or suicidal ideation Ophthalmic ROS: negative for - blurry vision, double vision, eye pain or loss of vision ENT ROS: negative for - epistaxis, nasal discharge, oral lesions, sore throat, tinnitus or vertigo Allergy and Immunology ROS: negative for - hives or itchy/watery eyes Hematological and Lymphatic ROS: negative for - bleeding problems, bruising or swollen lymph nodes Endocrine ROS: negative for - galactorrhea, hair pattern changes, polydipsia/polyuria or  temperature intolerance Respiratory ROS: negative for - cough, hemoptysis, shortness of breath or wheezing Cardiovascular ROS: negative for - chest pain, dyspnea on exertion, edema or irregular heartbeat Gastrointestinal ROS: negative for - abdominal pain, diarrhea, hematemesis, nausea/vomiting or stool incontinence Genito-Urinary ROS: negative for - dysuria, hematuria, incontinence or urinary frequency/urgency Musculoskeletal ROS: negative for - joint swelling.  Neurological ROS: as noted in HPI Dermatological ROS: negative for rash and skin lesion changes   Physical exam:  Constitutional: well developed, pleasant female in no apparent distress. Blood pressure 130/86, pulse 80, temperature 97.4 F (36.3 C), temperature source Oral, resp. rate 24, weight 103.511 kg (228 lb 3.2 oz), SpO2 98 %. Eyes: no jaundice or exophthalmos.  Head: normocephalic. Neck: marked tenderness on palpation of the posterior and left lateral neck, range of motion limited by pain, no bruits, no JVD. Cardiac: no murmurs. Lungs: clear. Abdomen: soft, no tender, no mass. Extremities: no edema, clubbing, or cyanosis but palpation of the back of the left leg and thigh provokes excruciating pain.  Skin: no rash  Neurologic Examination:                                                                                                      General: NAD Mental Status: Alert, oriented, thought content appropriate.  Speech fluent without evidence of aphasia.  Able to follow 3 step commands without difficulty. Cranial Nerves: II: Discs flat bilaterally; Visual fields grossly normal, pupils equal, round, reactive to light and accommodation III,IV, VI: ptosis not present, extra-ocular motions intact bilaterally V,VII: smile symmetric, facial light touch sensation normal bilaterally VIII: hearing normal bilaterally IX,X: uvula rises symmetrically XI: bilateral shoulder shrug XII: midline tongue extension without atrophy or  fasciculations Motor: There is significant pain involved which precludes a reliable muscle testing, and with that caveat right deltoid 3/5, right biceps and triceps 4 minus/5. Left biceps/triceps 1/5, deltoid 0/5, grip 3/5. Right hip flexors and knee extensors 5/5. Left hip flexors 4/5, left knee extensors 3/5. Dorsiflexors 4/5 bilaterally. Tone and bulk:normal tone throughout; no atrophy noted Sensory: Pinprick and light touch intact throughout, bilaterally Deep Tendon Reflexes:  1+ at the biceps, triceps, and patella bilaterally. (+) Hoffman's on the left. Plantars: Right: downgoing   Left: downgoing Cerebellar: Severe pain precludes finger to nose and HKS Gait:  No tested due to multiple leads, severe movement-induced pain.    Lab Results  Component Value Date/Time   CHOL 160 03/28/2015 04:15 AM   CHOL 137  02/03/2013 04:41 AM    Results for orders placed or performed during the hospital encounter of 03/30/15 (from the past 48 hour(s))  Glucose, capillary     Status: Abnormal   Collection Time: 03/30/15  9:47 PM  Result Value Ref Range   Glucose-Capillary 180 (H) 65 - 99 mg/dL  Troponin I     Status: Abnormal   Collection Time: 03/30/15 10:11 PM  Result Value Ref Range   Troponin I 0.11 (H) <0.031 ng/mL    Comment:        PERSISTENTLY INCREASED TROPONIN VALUES IN THE RANGE OF 0.04-0.49 ng/mL CAN BE SEEN IN:       -UNSTABLE ANGINA       -CONGESTIVE HEART FAILURE       -MYOCARDITIS       -CHEST TRAUMA       -ARRYHTHMIAS       -LATE PRESENTING MYOCARDIAL INFARCTION       -COPD   CLINICAL FOLLOW-UP RECOMMENDED.   CBC     Status: Abnormal   Collection Time: 03/30/15 10:11 PM  Result Value Ref Range   WBC 15.8 (H) 4.0 - 10.5 K/uL   RBC 4.78 3.87 - 5.11 MIL/uL   Hemoglobin 12.9 12.0 - 15.0 g/dL   HCT 39.9 36.0 - 46.0 %   MCV 83.5 78.0 - 100.0 fL   MCH 27.0 26.0 - 34.0 pg   MCHC 32.3 30.0 - 36.0 g/dL   RDW 13.5 11.5 - 15.5 %   Platelets 353 150 - 400 K/uL  Basic  metabolic panel     Status: Abnormal   Collection Time: 03/30/15 10:11 PM  Result Value Ref Range   Sodium 131 (L) 135 - 145 mmol/L   Potassium 4.1 3.5 - 5.1 mmol/L   Chloride 90 (L) 101 - 111 mmol/L   CO2 28 22 - 32 mmol/L   Glucose, Bld 197 (H) 65 - 99 mg/dL   BUN 12 6 - 20 mg/dL   Creatinine, Ser 0.75 0.44 - 1.00 mg/dL   Calcium 9.3 8.9 - 10.3 mg/dL   GFR calc non Af Amer >60 >60 mL/min   GFR calc Af Amer >60 >60 mL/min    Comment: (NOTE) The eGFR has been calculated using the CKD EPI equation. This calculation has not been validated in all clinical situations. eGFR's persistently <60 mL/min signify possible Chronic Kidney Disease.    Anion gap 13 5 - 15  Troponin I     Status: Abnormal   Collection Time: 03/31/15  3:56 AM  Result Value Ref Range   Troponin I 0.27 (H) <0.031 ng/mL    Comment:        PERSISTENTLY INCREASED TROPONIN VALUES IN THE RANGE OF 0.04-0.49 ng/mL CAN BE SEEN IN:       -UNSTABLE ANGINA       -CONGESTIVE HEART FAILURE       -MYOCARDITIS       -CHEST TRAUMA       -ARRYHTHMIAS       -LATE PRESENTING MYOCARDIAL INFARCTION       -COPD   CLINICAL FOLLOW-UP RECOMMENDED.   Glucose, capillary     Status: Abnormal   Collection Time: 03/31/15  7:46 AM  Result Value Ref Range   Glucose-Capillary 206 (H) 65 - 99 mg/dL  Glucose, capillary     Status: Abnormal   Collection Time: 03/31/15 11:29 AM  Result Value Ref Range   Glucose-Capillary 245 (H) 65 - 99 mg/dL  Glucose, capillary  Status: Abnormal   Collection Time: 03/31/15  4:44 PM  Result Value Ref Range   Glucose-Capillary 258 (H) 65 - 99 mg/dL  Glucose, capillary     Status: Abnormal   Collection Time: 03/31/15  8:42 PM  Result Value Ref Range   Glucose-Capillary 241 (H) 65 - 99 mg/dL  Glucose, capillary     Status: Abnormal   Collection Time: 04/01/15  7:36 AM  Result Value Ref Range   Glucose-Capillary 203 (H) 65 - 99 mg/dL  Rapid strep screen (not at [facility])     Status: None   Collection  Time: 04/01/15  8:41 AM  Result Value Ref Range   Streptococcus, Group A Screen (Direct) NEGATIVE NEGATIVE    Comment: (NOTE) A Rapid Antigen test may result negative if the antigen level in the sample is below the detection level of this test. The FDA has not cleared this test as a stand-alone test therefore the rapid antigen negative result has reflexed to a Group A Strep culture.   Influenza panel by PCR (type A & B, H1N1) (Not at [facility])     Status: None   Collection Time: 04/01/15  9:22 AM  Result Value Ref Range   Influenza A By PCR NEGATIVE NEGATIVE   Influenza B By PCR NEGATIVE NEGATIVE   H1N1 flu by pcr NOT DETECTED NOT DETECTED    Comment:        The Xpert Flu assay (FDA approved for nasal aspirates or washes and nasopharyngeal swab specimens), is intended as an aid in the diagnosis of influenza and should not be used as a sole basis for treatment.   Magnesium     Status: None   Collection Time: 04/01/15 10:00 AM  Result Value Ref Range   Magnesium 2.3 1.7 - 2.4 mg/dL  CK     Status: None   Collection Time: 04/01/15 10:00 AM  Result Value Ref Range   Total CK 42 38 - 234 U/L  Glucose, capillary     Status: Abnormal   Collection Time: 04/01/15 11:21 AM  Result Value Ref Range   Glucose-Capillary 246 (H) 65 - 99 mg/dL    Dg Chest 2 View  03/30/2015   CLINICAL DATA:  Congestive heart failure. Shortness of breath. Dizziness. Chest pain extending to the left arm.  EXAM: CHEST  2 VIEW  COMPARISON:  03/27/2015 and multiple previous  FINDINGS: Dual lead pacemaker appears unchanged. Heart size is normal. The aorta is unfolded. Chronic interstitial pulmonary markings are again demonstrated consistent with chronic fibrosis. No sign of new focal infiltrate, collapse or effusion.  IMPRESSION: No change.  Dual lead pacemaker.  Pulmonary fibrosis pattern.   Electronically Signed   By: [name] M.D.   On: 03/30/2015 16:18   Dg Shoulder Right  03/31/2015   CLINICAL DATA:  Acute  right shoulder pain.  EXAM: RIGHT SHOULDER - 2+ VIEW  COMPARISON:  None.  FINDINGS: No fracture, Hill-Sachs deformity, dislocation or suspicious focal osseous lesion in the right shoulder. 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 Right glenohumeral joint appears grossly normal on these limited views. No pathologic soft tissue calcifications.  IMPRESSION: 1. No fracture or dislocation in the right shoulder. 2. Mild right AC joint osteoarthritis.   Electronically Signed   By: [name] M.D.   On: 03/31/2015 15:00   Ct Head Wo Contrast  03/30/2015   CLINICAL DATA:  Initial encounter for left upper extremity weakness.  EXAM: CT HEAD WITHOUT CONTRAST  TECHNIQUE: Contiguous  axial images were obtained from the base of the skull through the vertex without intravenous contrast.  COMPARISON:  09/12/2014.  FINDINGS: There is no evidence for acute hemorrhage, hydrocephalus, mass lesion, or abnormal extra-axial fluid collection. No definite CT evidence for acute infarction. Patchy low attenuation in the deep hemispheric and periventricular white matter is nonspecific, but likely reflects chronic microvascular ischemic demyelination. Small hypodensities in the periventricular white matter are compatible with lacunar infarcts bilaterally and are unchanged. The visualized paranasal sinuses and mastoid air cells are clear.  IMPRESSION: No acute intracranial abnormality.  Chronic small vessel white matter ischemic disease.   Electronically Signed   By: Misty Stanley M.D.   On: 03/30/2015 16:30   Dg Shoulder Left  03/31/2015   CLINICAL DATA:  Acute shoulder pain 9 days ago without trauma.  EXAM: LEFT SHOULDER - 2+ VIEW  COMPARISON:  None.  FINDINGS: Glenohumeral joint is normal. No degeneration or subluxation. Normal humeral acromial distance. AC joint appears normal.  IMPRESSION: Negative.   Electronically Signed   By: [name] M.D.   On: 03/31/2015 15:32   Assessment/Plan: 69 y/o with new onset acute  severe pain bilateral shoulders-neck-proximal upper extremities rapidly follow by weakness left > right UE as well as pain- proximal weakness left LE. The neuro-exam is limited by excruciating pain worsened by even slight arms/legs movement or touch. No frank myelopathic signs on exam except for a (+) Hoffman's on left  . CT cervical spine is not very impressive. X ray shoulders unremarkable. Patient has a prominent component of pain which is a significant obstacle to reliable muscle strength testing at this moment. She does exhibit a (+) Hoffman's on left but no other myelopathic signs. I agree that the temporal evolution of her symptoms and her exam are not consistent with a cervical radiculopathy, and the presence of a (+) Hoffman's on left in conjunction with the rapid development of weakness with the pattern describe above could be indicative of a cord process, but once again, superimposed pain is a major limitation on her exam. In any case, she can not have an MRI and although CT myelogram seems a logical next step, I also favor considering the possibility of a primary musculoskeletal/rheumatological disorder (unsure if the weakness is secondary to severe pain). Recommend checking sed rate, c-reactive protein. Will follow up.  Dorian Pod, MD 04/01/2015, 4:13 PM  [facility].

## 2015-04-01 NOTE — Progress Notes (Signed)
Pt had 8 bts NSVT on telemetry, asymptomatic.  Last Trop 0.27, K 4.1.  Kathline Magic, NP notified via text page.  Will cont to monitor.

## 2015-04-02 DIAGNOSIS — R531 Weakness: Secondary | ICD-10-CM

## 2015-04-02 LAB — GLUCOSE, CAPILLARY
Glucose-Capillary: 149 mg/dL — ABNORMAL HIGH (ref 65–99)
Glucose-Capillary: 181 mg/dL — ABNORMAL HIGH (ref 65–99)
Glucose-Capillary: 186 mg/dL — ABNORMAL HIGH (ref 65–99)
Glucose-Capillary: 217 mg/dL — ABNORMAL HIGH (ref 65–99)

## 2015-04-02 MED ORDER — GABAPENTIN 100 MG PO CAPS
100.0000 mg | ORAL_CAPSULE | Freq: Three times a day (TID) | ORAL | Status: DC
  Administered 2015-04-02 – 2015-04-03 (×5): 100 mg via ORAL
  Filled 2015-04-02 (×5): qty 1

## 2015-04-02 NOTE — Evaluation (Signed)
Physical Therapy Evaluation Patient Details Name: HALY BUNDA MRN: [id number] DOB: Nov 19, 1945 Today's Date: 04/02/2015   History of Present Illness  pt is a 69 y/o female with PMHX of DM, CHF, COPD, arthritis and Stroke with some residual weakness L, transfered from [facility] with LUE and RUE pain and new development of L UE then later L LE weakness.  Cardiac etiology ruled out.  CT shows possible radiculopathy in the C%-C7 area, but not significant. Decadron started without significant response.  Work up continues.  Clinical Impression  Pt admitted with/for B UE pain and weakness of unknown origin.  Pt currently limited functionally due to the problems listed. ( See problems list.)   Pt will benefit from PT to maximize function and safety in order to get ready for next venue listed below due to no consistent help at home.     Follow Up Recommendations SNF;Supervision/Assistance - 24 hour    Equipment Recommendations  Other (comment) (TBA after next venue)    Recommendations for Other Services       Precautions / Restrictions Precautions Precautions: Fall      Mobility  Bed Mobility Overal bed mobility: Needs Assistance Bed Mobility: Supine to Sit;Sit to Supine     Supine to sit: Mod assist Sit to supine: Max assist   General bed mobility comments: mod to EOB and to reposition.  Little use of the L UE  Transfers Overall transfer level: Needs assistance Equipment used: Rolling walker (2 wheeled) Transfers: Sit to/from [facility] Sit to Stand: Mod assist Stand pivot transfers: Mod assist       General transfer comment: Needed lift assist and steady assist with RW to [facility] and back to the bed.  Ambulation/Gait             General Gait Details: not tested  Stairs            Wheelchair Mobility    Modified Rankin (Stroke Patients Only)       Balance Overall balance assessment: Needs assistance Sitting-balance support: Feet supported;No  upper extremity supported Sitting balance-Leahy Scale: Fair     Standing balance support: During functional activity Standing balance-Leahy Scale: Poor Standing balance comment: reliant on assist                             Pertinent Vitals/Pain Pain Assessment: Faces Faces Pain Scale: Hurts whole lot Pain Location: L arm and L calf  (muscle) Pain Descriptors / Indicators: Aching;Sore Pain Intervention(s): Monitored during session;Limited activity within patient's tolerance    Home Living Family/patient expects to be discharged to:: Private residence Living Arrangements: Children Available Help at Discharge: Available PRN/intermittently (daughter works and pt is alone long periods.) Type of Home: House Home Access: Stairs to enter Entrance Stairs-Rails: Psychiatric nurse of Steps: 4 Home Layout: One level Home Equipment: Nurse, children's - 4 wheels;Bedside commode      Prior Function Level of Independence: Needs assistance   Gait / Transfers Assistance Needed: at most, pt could walk to her master bathroom toilet, now she has trouble with transfer to [facility] without assist.           Hand Dominance        Extremity/Trunk Assessment   Upper Extremity Assessment:  (gernally weak R UE, grossly 2+ to 3- L UE and painful)           Lower Extremity Assessment: RLE deficits/detail;LLE deficits/detail RLE Deficits /  Details: WFL at grossly >4/5 LLE Deficits / Details: generally weak at 3+/5 AND painful in the calf     Communication   Communication: No difficulties  Cognition Arousal/Alertness: Awake/alert;Lethargic Behavior During Therapy: WFL for tasks assessed/performed Overall Cognitive Status: Within Functional Limits for tasks assessed                      General Comments      Exercises        Assessment/Plan    PT Assessment Patient needs continued PT services  PT Diagnosis Difficulty walking;Generalized  weakness;Acute pain   PT Problem List Decreased strength;Decreased activity tolerance;Decreased balance;Decreased mobility;Decreased coordination;Decreased knowledge of use of DME;Decreased safety awareness;Cardiopulmonary status limiting activity;Pain  PT Treatment Interventions DME instruction;Gait training;Functional mobility training;Therapeutic activities;Balance training;Patient/family education;Neuromuscular re-education;Therapeutic exercise   PT Goals (Current goals can be found in the Care Plan section) Acute Rehab PT Goals PT Goal Formulation: With patient Time For Goal Achievement: 04/16/15 Potential to Achieve Goals: Fair    Frequency Min 3X/week   Barriers to discharge Decreased caregiver support      Co-evaluation               End of Session Equipment Utilized During Treatment: Oxygen Activity Tolerance: Patient limited by fatigue;Patient tolerated treatment well Patient left: in bed;with call bell/phone within reach Nurse Communication: Mobility status         Time: [id number] PT Time Calculation (min) (ACUTE ONLY): 41 min   Charges:   PT Evaluation $Initial PT Evaluation Tier I: 1 Procedure PT Treatments $Therapeutic Activity: 23-37 mins   PT G Codes:        Suyash Amory, Tessie Fass 04/02/2015, 4:30 PM 04/02/2015  Donnella Sham, [place] [phone]  (pager)

## 2015-04-02 NOTE — Care Management Note (Signed)
Case Management Note  Patient Details  Name: Debra Saunders MRN: [id number] Date of Birth: 1946/02/06  Subjective/Objective:    weakness                Action/Plan:  SNF recommended. Pt requesting [facility]. NCM notified CSW with referral.   Expected Discharge Date:  04/04/2015               Expected Discharge Plan:  [place]  In-House Referral:  Clinical Social Work  Discharge planning Services  CM Consult   Status of Service:  Completed, signed off  Medicare Important Message Given:  Yes-second notification given Date Medicare IM Given:    Medicare IM give by:    Date Additional Medicare IM Given:    Additional Medicare Important Message give by:     If discussed at [place] of Stay Meetings, dates discussed:    Additional Comments:  Erenest Rasher, RN 04/02/2015, 4:24 PM

## 2015-04-02 NOTE — Care Management Important Message (Signed)
Important Message  Patient Details  Name: Debra Saunders MRN: [id number] Date of Birth: [birth year]   Medicare Important Message Given:  Yes-second notification given    Nathen May 04/02/2015, 11:40 AM

## 2015-04-02 NOTE — Progress Notes (Signed)
PROGRESS NOTE  Debra Saunders [id number] DOB: 1945/10/13 DOA: 03/30/2015 PCP: [name], MD   HPI: 69 y.o. female with h/o multiple medical problems. Patient presents to the ED at [facility] for LUE and RUE pain as well as now development of LUE weakness. Pain is described as a shooting pain in both arms L > R. Weakness is in the L arm especially in the shoulder. She was transferred from [facility] here on 10/7 for neurosurgical consult.   Subjective / 24 H Interval events - doesn't feel good, cannot elaborate other than she hasn't woken up yet  Assessment/Plan: Principal Problem:   Cervical disc disorder with radiculopathy of cervical region Active Problems:   Hypertension   Diabetes mellitus type 2, controlled (HCC)   Chronic respiratory failure with hypoxia (HCC)   Elevated troponin   Weakness   Radiculopathy / left sided weakness - neurosurgery evaluated patient, I discussed with Dr. Kathyrn Sheriff 10/9, he doesn't feel strongly as this is caused by radiculopathy, I agree. She has C spine pathology as per CT scan on 10/2 however not severe enough as to explain her symptoms. She is quite weak in her left leg as well - Neurology following, ?if this is a primary MSK/rheum disorder. Inflammatory markers quite high, however did not respond to steroids. - continue Decadron, taper as this does not seem to be helping - will discuss with Dr. Janann Colonel today   Sore throat  - with some generalized symptoms, negative strep throat, negative influenza, likely viral non specific. 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 PT consult.  Consultants:  Neurosurgery   Neurology   Procedures:  None    Antibiotics  Anti-infectives    None      Studies  Dg Shoulder Right  03/31/2015   CLINICAL DATA:  Acute right shoulder pain.  EXAM: RIGHT SHOULDER - 2+ VIEW  COMPARISON:  None.  FINDINGS: No fracture, Hill-Sachs deformity, dislocation or suspicious focal osseous lesion in the right shoulder. 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 Right glenohumeral joint appears grossly normal on these limited views. No pathologic soft tissue calcifications.  IMPRESSION: 1. No fracture or dislocation in the right shoulder. 2. Mild right AC joint osteoarthritis.   Electronically Signed   By: [name] M.D.   On: 03/31/2015 15:00   Dg Shoulder Left  03/31/2015   CLINICAL DATA:  Acute shoulder pain 9 days ago without trauma.  EXAM: LEFT SHOULDER - 2+ VIEW  COMPARISON:  None.  FINDINGS: Glenohumeral joint is normal. No degeneration or subluxation. Normal humeral acromial distance. AC joint appears normal.  IMPRESSION: Negative.   Electronically Signed   By: [name] M.D.   On: 03/31/2015 15:32   Objective  Filed Vitals:   04/01/15 0450 04/01/15 1700 04/01/15  2026 04/02/15 0500  BP: 130/86 141/78 150/72   Pulse: 80 76 82 80  Temp: 97.4 F (36.3 C) 98.3 F (36.8 C) 98.6 F (37 C)   TempSrc: Oral Oral Oral   Resp:  22    Weight: 103.511 kg (228 lb 3.2 oz)     SpO2: 98% 93% 94% 65%    Intake/Output Summary (Last 24 hours) at 04/02/15 0841 Last data filed at 04/01/15 1132  Gross per 24 hour  Intake      0 ml  Output    100 ml  Net   -100  ml   Filed Weights   04/01/15 0450  Weight: 103.511 kg (228 lb 3.2 oz)    Exam:  GENERAL: NAD  HEENT: head NCAT, no scleral icterus. Pupils round and reactive. Mucous membranes are moist. Posterior pharynx with erythema  NECK: Supple. No LAD  LUNGS: Clear to auscultation. No wheezing or crackles  HEART: Regular rate and rhythm without murmur. 2+ pulses, no JVD, peripheral edema on left leg  ABDOMEN: Soft, non-distended, non-tender. Positive bowel sounds.  EXTREMITIES: Without any cyanosis or clubbing. Good muscle tone  NEUROLOGIC: Alert and oriented x3. Cranial nerves II through XII are grossly intact. Strength 2-3/5 on left upper and lower, 5/5 on right.    Data Reviewed: Basic Metabolic Panel:  Recent Labs Lab 03/27/15 1615 03/28/15 0415 03/30/15 1442 03/30/15 2211 04/01/15 1000  NA 131* 133* 130* 131*  --   K 3.7 4.0 3.8 4.1  --   CL 94* 97* 89* 90*  --   CO2 26 28 28 28   --   GLUCOSE 186* 151* 160* 197*  --   BUN 9 20 10 12   --   CREATININE 0.68 0.65 0.60 0.75  --   CALCIUM 9.1 9.0 9.6 9.3  --   MG  --   --   --   --  2.3   CBC:  Recent Labs Lab 03/27/15 1615 03/28/15 0415 03/30/15 1442 03/30/15 2211  WBC 15.7* 15.7* 14.4* 15.8*  NEUTROABS 14.0*  --  11.9*  --   HGB 11.7* 11.1* 12.4 12.9  HCT 37.4 35.3 39.0 39.9  MCV 83.3 83.6 82.9 83.5  PLT 245 253 351 353   Cardiac Enzymes:  Recent Labs Lab 03/28/15 0415 03/28/15 1014 03/30/15 1442 03/30/15 2211 03/31/15 0356 04/01/15 1000  CKTOTAL  --   --   --   --   --  42  TROPONINI 0.09* 0.14* 0.13* 0.11* 0.27*  --    BNP (last 3 results)  Recent Labs  03/30/15 1442  BNP 101.0*   ProBNP (last 3 results)  Recent Labs  05/01/14 1036 06/22/14 0952  PROBNP 44.0 45.0   CBG:  Recent Labs Lab 04/01/15 0736 04/01/15 1121 04/01/15 1709 04/01/15 2126 04/02/15 0729  GLUCAP 203* 246* 184* 185* 149*   Scheduled Meds: . ambrisentan  5 mg Oral Daily  . dexamethasone  4 mg Intravenous  Q12H  . insulin aspart  0-15 Units Subcutaneous TID WC  . insulin detemir  30 Units Subcutaneous QHS  . potassium chloride SA  20 mEq Oral BID  . torsemide  20 mg Oral Daily  . verapamil  240 mg Oral Daily   Continuous Infusions:   Marzetta Board, MD [facility] Pager [phone]. If 7 PM - 7 AM, please contact night-coverage at [email or web address], password [facility] 04/02/2015, 8:41 AM  LOS: 3 days

## 2015-04-02 NOTE — Progress Notes (Signed)
Subjective: Patient states no improvement.  Continues to have bilateral UE weakness and pain and LLE pain and weakness.   Objective: Current vital signs: BP 140/77 mmHg  Pulse 60  Temp(Src) 98.3 F (36.8 C) (Oral)  Resp 20  Wt 103.511 kg (228 lb 3.2 oz)  SpO2 93% Vital signs in last 24 hours: Temp:  [98.3 F (36.8 C)-98.6 F (37 C)] 98.3 F (36.8 C) (10/10 0854) Pulse Rate:  [60-82] 60 (10/10 0854) Resp:  [20-22] 20 (10/10 0854) BP: (140-150)/(72-78) 140/77 mmHg (10/10 0854) SpO2:  [65 %-94 %] 93 % (10/10 0935)  Intake/Output from previous day: 10/09 0701 - 10/10 0700 In: -  Out: 300 [Urine:300] Intake/Output this shift: Total I/O In: 120 [P.O.:120] Out: 300 [Urine:300] Nutritional status: Diet Carb Modified Fluid consistency:: Thin; Room service appropriate?: Yes  Neurologic Exam: General: NAD Mental Status: Alert, oriented, thought content appropriate.  Speech fluent without evidence of aphasia.  Able to follow 3 step commands without difficulty. Cranial Nerves: II: Discs flat bilaterally; Visual fields grossly normal, pupils equal, round, reactive to light and accommodation III,IV, VI: ptosis not present, extra-ocular motions intact bilaterally V,VII: smile symmetric, facial light touch sensation normal bilaterally VIII: hearing normal bilaterally IX,X: uvula rises symmetrically XI: bilateral shoulder shrug XII: midline tongue extension without atrophy or fasciculations  Motor: Patient complains of pain in all extremities and this hinders exam LUE-- grip shows give way strength at 5/5, tricep 4/5 with give way strength, bicep o/5 with no effort, deltoid 2/5 with pain.   RUE--4/5 throughout with give way strength RLE 4/5 throughout LLE--3/5 hip flexor, KF 3/5, KE 3/5 DF/PF 4/5 with limited effort due to pain.     Sensory: Pinprick and light touch intact throughout, bilaterally Deep Tendon Reflexes:  1+ though out UE and 2+ in bilateral KJ no  AJ  Plantars: Right: downgoing   Left: downgoing    Lab Results: Basic Metabolic Panel:  Recent Labs Lab 03/27/15 1615 03/28/15 0415 03/30/15 1442 03/30/15 2211 04/01/15 1000  NA 131* 133* 130* 131*  --   K 3.7 4.0 3.8 4.1  --   CL 94* 97* 89* 90*  --   CO2 _0 --   GLUCOSE 186* 151* 160* 197*  --   BUN _1 --   CREATININE 0.68 0.65 0.60 0.75  --   CALCIUM 9.1 9.0 9.6 9.3  --   MG  --   --   --   --  2.3    Liver Function Tests: No results for input(s): AST, ALT, ALKPHOS, BILITOT, PROT, ALBUMIN in the last 168 hours. No results for input(s): LIPASE, AMYLASE in the last 168 hours. No results for input(s): AMMONIA in the last 168 hours.  CBC:  Recent Labs Lab 03/27/15 1615 03/28/15 0415 03/30/15 1442 03/30/15 2211  WBC 15.7* 15.7* 14.4* 15.8*  NEUTROABS 14.0*  --  11.9*  --   HGB 11.7* 11.1* 12.4 12.9  HCT 37.4 35.3 39.0 39.9  MCV 83.3 83.6 82.9 83.5  PLT 245 253 351 353    Cardiac Enzymes:  Recent Labs Lab 03/28/15 0415 03/28/15 1014 03/30/15 1442 03/30/15 2211 03/31/15 0356 04/01/15 1000  CKTOTAL  --   --   --   --   --  42  TROPONINI 0.09* 0.14* 0.13* 0.11* 0.27*  --     Lipid Panel:  Recent Labs Lab 03/28/15 0415  CHOL 160  TRIG 107  HDL 48  CHOLHDL 3.3  VLDL 21  LDLCALC 91    CBG:  Recent Labs Lab 04/01/15 0736 04/01/15 1121 04/01/15 1709 04/01/15 2126 04/02/15 0729  GLUCAP 203* 246* 184* 185* 149*    Microbiology: Results for orders placed or performed during the hospital encounter of 03/30/15  Rapid strep screen (not at [facility])     Status: None   Collection Time: 04/01/15  8:41 AM  Result Value Ref Range Status   Streptococcus, Group A Screen (Direct) NEGATIVE NEGATIVE Final    Comment: (NOTE) A Rapid Antigen test may result negative if the antigen level in the sample is below the detection level of this test. The FDA has not cleared this test as a stand-alone test therefore the rapid  antigen negative result has reflexed to a Group A Strep culture.     Coagulation Studies: No results for input(s): LABPROT, INR in the last 72 hours.  Imaging: Dg Shoulder Right  03/31/2015   CLINICAL DATA:  Acute right shoulder pain.  EXAM: RIGHT SHOULDER - 2+ VIEW  COMPARISON:  None.  FINDINGS: No fracture, Hill-Sachs deformity, dislocation or suspicious focal osseous lesion in the right shoulder. 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 Right glenohumeral joint appears grossly normal on these limited views. No pathologic soft tissue calcifications.  IMPRESSION: 1. No fracture or dislocation in the right shoulder. 2. Mild right AC joint osteoarthritis.   Electronically Signed   By: [name] M.D.   On: 03/31/2015 15:00   Dg Shoulder Left  03/31/2015   CLINICAL DATA:  Acute shoulder pain 9 days ago without trauma.  EXAM: LEFT SHOULDER - 2+ VIEW  COMPARISON:  None.  FINDINGS: Glenohumeral joint is normal. No degeneration or subluxation. Normal humeral acromial distance. AC joint appears normal.  IMPRESSION: Negative.   Electronically Signed   By: [name] M.D.   On: 03/31/2015 15:32    Medications:  Scheduled: . ambrisentan  5 mg Oral Daily  . dexamethasone  4 mg Intravenous Q12H  . insulin aspart  0-15 Units Subcutaneous TID WC  . insulin detemir  30 Units Subcutaneous QHS  . potassium chloride SA  20 mEq Oral BID  . torsemide  20 mg Oral Daily  . verapamil  240 mg Oral Daily    Assessment/Plan: 69 YO female with diffuse pain throughout, greater in the left arm. Exam is limited by pain. CT brain and cervical spine show no etiology.  MRI is not able to be obtained due to pacer. CRP >5.5 and Sed Rate 102. However the patient has shown no response to Decadron.   At this time likely rheumatological but unclear exact etiology.  She would benefit from a out patient rheumatological eval and out patient EMG/NCV.   Etta Quill PA-C [facility] [phone]  04/02/2015, 9:52 AM    Patient seen and evaluated. Agree with above assessment and plan. Due to markedly elevated CPR and ESR she would benefit from further rheumatological workup and EMG/NCV.   Jim Like, DO [facility] [phone]  If 7pm- 7am, please page neurology on call as listed in [place].

## 2015-04-02 NOTE — Progress Notes (Signed)
Utilization review completed. Quana Chamberlain, RN, BSN. 

## 2015-04-03 ENCOUNTER — Telehealth: Payer: Self-pay

## 2015-04-03 DIAGNOSIS — E876 Hypokalemia: Secondary | ICD-10-CM | POA: Diagnosis not present

## 2015-04-03 DIAGNOSIS — R7989 Other specified abnormal findings of blood chemistry: Secondary | ICD-10-CM | POA: Diagnosis not present

## 2015-04-03 DIAGNOSIS — E784 Other hyperlipidemia: Secondary | ICD-10-CM | POA: Diagnosis not present

## 2015-04-03 DIAGNOSIS — M25511 Pain in right shoulder: Secondary | ICD-10-CM | POA: Diagnosis not present

## 2015-04-03 DIAGNOSIS — G4733 Obstructive sleep apnea (adult) (pediatric): Secondary | ICD-10-CM | POA: Diagnosis not present

## 2015-04-03 DIAGNOSIS — J9600 Acute respiratory failure, unspecified whether with hypoxia or hypercapnia: Secondary | ICD-10-CM | POA: Diagnosis not present

## 2015-04-03 DIAGNOSIS — J441 Chronic obstructive pulmonary disease with (acute) exacerbation: Secondary | ICD-10-CM | POA: Diagnosis not present

## 2015-04-03 DIAGNOSIS — I5030 Unspecified diastolic (congestive) heart failure: Secondary | ICD-10-CM | POA: Diagnosis not present

## 2015-04-03 DIAGNOSIS — R531 Weakness: Secondary | ICD-10-CM | POA: Diagnosis not present

## 2015-04-03 DIAGNOSIS — E119 Type 2 diabetes mellitus without complications: Secondary | ICD-10-CM | POA: Diagnosis not present

## 2015-04-03 DIAGNOSIS — I10 Essential (primary) hypertension: Secondary | ICD-10-CM | POA: Diagnosis not present

## 2015-04-03 DIAGNOSIS — E1169 Type 2 diabetes mellitus with other specified complication: Secondary | ICD-10-CM | POA: Diagnosis not present

## 2015-04-03 DIAGNOSIS — D649 Anemia, unspecified: Secondary | ICD-10-CM | POA: Diagnosis not present

## 2015-04-03 DIAGNOSIS — I495 Sick sinus syndrome: Secondary | ICD-10-CM | POA: Diagnosis not present

## 2015-04-03 DIAGNOSIS — J9611 Chronic respiratory failure with hypoxia: Secondary | ICD-10-CM | POA: Diagnosis not present

## 2015-04-03 DIAGNOSIS — I509 Heart failure, unspecified: Secondary | ICD-10-CM | POA: Diagnosis not present

## 2015-04-03 DIAGNOSIS — Z5189 Encounter for other specified aftercare: Secondary | ICD-10-CM | POA: Diagnosis not present

## 2015-04-03 DIAGNOSIS — M5010 Cervical disc disorder with radiculopathy, unspecified cervical region: Secondary | ICD-10-CM | POA: Diagnosis not present

## 2015-04-03 DIAGNOSIS — M25519 Pain in unspecified shoulder: Secondary | ICD-10-CM | POA: Diagnosis not present

## 2015-04-03 DIAGNOSIS — M6281 Muscle weakness (generalized): Secondary | ICD-10-CM | POA: Diagnosis not present

## 2015-04-03 DIAGNOSIS — E039 Hypothyroidism, unspecified: Secondary | ICD-10-CM | POA: Diagnosis not present

## 2015-04-03 DIAGNOSIS — M25512 Pain in left shoulder: Secondary | ICD-10-CM | POA: Diagnosis not present

## 2015-04-03 LAB — GLUCOSE, CAPILLARY
Glucose-Capillary: 138 mg/dL — ABNORMAL HIGH (ref 65–99)
Glucose-Capillary: 206 mg/dL — ABNORMAL HIGH (ref 65–99)
Glucose-Capillary: 240 mg/dL — ABNORMAL HIGH (ref 65–99)

## 2015-04-03 LAB — CBC
HCT: 36.1 % (ref 36.0–46.0)
Hemoglobin: 11.3 g/dL — ABNORMAL LOW (ref 12.0–15.0)
MCH: 26.3 pg (ref 26.0–34.0)
MCHC: 31.3 g/dL (ref 30.0–36.0)
MCV: 84 fL (ref 78.0–100.0)
Platelets: 407 10*3/uL — ABNORMAL HIGH (ref 150–400)
RBC: 4.3 MIL/uL (ref 3.87–5.11)
RDW: 13.8 % (ref 11.5–15.5)
WBC: 11.2 10*3/uL — ABNORMAL HIGH (ref 4.0–10.5)

## 2015-04-03 LAB — BASIC METABOLIC PANEL
Anion gap: 9 (ref 5–15)
BUN: 44 mg/dL — ABNORMAL HIGH (ref 6–20)
CO2: 31 mmol/L (ref 22–32)
Calcium: 9.2 mg/dL (ref 8.9–10.3)
Chloride: 94 mmol/L — ABNORMAL LOW (ref 101–111)
Creatinine, Ser: 0.79 mg/dL (ref 0.44–1.00)
GFR calc Af Amer: >60 mL/min (ref >60)
GFR calc non Af Amer: >60 mL/min (ref >60)
Glucose, Bld: 185 mg/dL — ABNORMAL HIGH (ref 65–99)
Potassium: 4.5 mmol/L (ref 3.5–5.1)
Sodium: 134 mmol/L — ABNORMAL LOW (ref 135–145)

## 2015-04-03 MED ORDER — OXYCODONE-ACETAMINOPHEN 5-325 MG PO TABS: 1.0000 | ORAL_TABLET | Freq: Three times a day (TID) | ORAL | Status: DC | PRN

## 2015-04-03 MED ORDER — PREDNISONE 20 MG PO TABS: 40.0000 mg | ORAL_TABLET | Freq: Every day | ORAL | Status: DC

## 2015-04-03 MED ORDER — DIAZEPAM 5 MG PO TABS: 5.0000 mg | ORAL_TABLET | Freq: Three times a day (TID) | ORAL | Status: DC | PRN

## 2015-04-03 NOTE — Telephone Encounter (Signed)
Unable to reach patient.  TCM attempt x1.  Will continue to follow up.

## 2015-04-03 NOTE — Discharge Summary (Signed)
Physician Discharge Summary  Debra Saunders KZL:[id number] DOB: July 27, 1945 DOA: 03/30/2015  PCP: [name], MD  Admit date: 03/30/2015 Discharge date: 04/03/2015  Time spent: > 30 minutes  Recommendations for Outpatient Follow-up:  1. Follow up with Dr. Gilford Rile in 1 week 2. Outpatient referral to rheumatology, will kindly ask Dr. Gilford Rile for someone in [place] area. 3. Outpatient follow up with Neurology for EMG/NCV. Ambulatory referral made. 4. Prednisone taper as below  Discharge Diagnoses:  Principal Problem:   Cervical disc disorder with radiculopathy of cervical region Active Problems:   Hypertension   Diabetes mellitus type 2, controlled ([facility])   Chronic respiratory failure with hypoxia (HCC)   Elevated troponin   Weakness  Discharge Condition: stable  Diet recommendation: heart healthy  Filed Weights   04/01/15 0450 04/03/15 0509  Weight: 103.511 kg (228 lb 3.2 oz) 102.7 kg (226 lb 6.6 oz)   History of present illness:  Debra Saunders is a 69 y.o. female with h/o multiple medical problems. Patient presents to the ED at [facility] for LUE and RUE pain as well as now development of LUE weakness. Pain is described as a shooting pain in both arms L > R. Weakness is in the R arm especially in the shoulder. She has difficulty lifting the arm. Pain has been ongoing for the last week. She was admitted for a cardiac rule out last week due to this pain and slightly elevated troponins. Work up however revealed that pain was more likely due to radiculopathy in the C5-C7 area on CT scan (cannot MRI patient due to presence of pacemaker). Weakness in LUE onset yesterday. She has no symptoms in either lower extremity. Patient was given steroids in ED and [facility] and possible myelogram.  Hospital Course:  Radiculopathy / left sided weakness - neurosurgery evaluated patient, I discussed with Dr. Kathyrn Sheriff, he doesn't feel strongly as this  is caused by radiculopathy, I agree. She has C spine pathology as per CT scan on 10/2 however she doesn't have severe disease to explain her symptoms. She is quite weak in her left leg as well, and neurology was consulted and have followed patient as well. Based on Dr. Hazle Quant impression, she is likely to have an underlying rheumatological condition and would benefit from rheumatology evaluation as well as EMG/NCV. She was started initially on decadron prior to transfer per neurosurgery, with some improvement in her symptoms. She will be discharged on a prednisone taper. Her CRP and ESR were found to be elevated as well, and CK was normal.  Sore throat - with some generalized symptoms, negative strep and negative flu, conservative management, improving.  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 and on no anticoagulation  Procedures:  None    Consultations:  Neurology  Neurosurgery  Discharge Exam: Filed Vitals:   04/02/15 1300 04/02/15 2024 04/02/15 2310 04/03/15 0509  BP: 134/68 141/80  122/72  Pulse: 71 67  58  Temp: 98.2 F (36.8 C) 98.6 F (37 C)  97.7 F (36.5 C)  TempSrc: Oral Oral  Oral  Resp:  _0 Weight:    102.7 kg (226 lb 6.6 oz)  SpO2: 97% 96%  100%    General: NAD Cardiovascular: RRR Respiratory: CTA biL  Discharge Instructions     Medication List    TAKE these medications        acetaminophen 325 MG tablet  Commonly known as:  TYLENOL  Take 325-650 mg by mouth every 6 (six) hours as needed for mild pain.     ambrisentan 5 MG tablet    Commonly known as:  LETAIRIS  Take 1 tablet (5 mg total) by mouth daily.     BIOFREEZE EX  Apply 1 application topically as needed (for pain).     cyclobenzaprine 5 MG tablet  Commonly known as:  FLEXERIL  Take 1 tablet (5 mg total) by mouth 3 (three) times daily as needed for muscle spasms.     diazepam 5 MG tablet  Commonly known as:  VALIUM  Take 1 tablet (5 mg total) by mouth every 8 (eight) hours as needed for muscle spasms (and/or neck pain).     glipiZIDE 5 MG tablet  Commonly known as:  GLUCOTROL  Take 0.5 tablets (2.5 mg total) by mouth daily before breakfast.     insulin detemir 100 UNIT/ML injection  Commonly known as:  LEVEMIR  Inject 0.4 mLs (40 Units total) into the skin at bedtime.     meclizine 25 MG tablet  Commonly known as:  ANTIVERT  Take 25 mg by mouth daily as needed for dizziness.     naproxen 500 MG EC tablet  Commonly known as:  EC NAPROSYN  Take 500 mg by mouth 2 (two) times daily as needed (for pain).     ondansetron 8 MG tablet  Commonly known as:  ZOFRAN  Take 1 tablet (8 mg total) by mouth every 8 (eight) hours as needed for nausea or vomiting.     oxyCODONE-acetaminophen 5-325 MG tablet  Commonly known as:  PERCOCET/ROXICET  Take 1 tablet by mouth every 8 (eight) hours as needed for severe pain.     polyethylene glycol packet  Commonly known as:  MIRALAX / GLYCOLAX  Take 17 g by mouth daily as needed for moderate constipation.     potassium chloride SA 20 MEQ tablet  Commonly known as:  KLOR-CON M20  Take 1 tablet (20 mEq total) by mouth 2 (two) times daily.     predniSONE 20 MG tablet  Commonly known as:  DELTASONE  Take 2 tablets (40 mg total) by mouth daily with breakfast. 40 mg daily for 3 days then 20 mg daily for 3 days then 10 mg daily for 4 days     promethazine 25 MG tablet  Commonly known as:  PHENERGAN  Take 25 mg by mouth every 4 (four) hours as needed for nausea or vomiting.     torsemide 20 MG tablet  Commonly known  as:  DEMADEX  Take 1 tablet (20 mg total) by mouth 2 (two) times daily.     verapamil 240 MG CR tablet  Commonly known as:  CALAN-SR  Take 240 mg by mouth daily.           Follow-up Information    Follow up with [name], MD. Schedule an appointment as soon as possible for a visit in 1 week.   Specialty:  Internal Medicine   Contact information:   [address] [place] [address] [phone]       The  results of significant diagnostics from this hospitalization (including imaging, microbiology, ancillary and laboratory) are listed below for reference.    Significant Diagnostic Studies: Dg Chest 2 View  03/30/2015   CLINICAL DATA:  Congestive heart failure. Shortness of breath. Dizziness. Chest pain extending to the left arm.  EXAM: CHEST  2 VIEW  COMPARISON:  03/27/2015 and multiple previous  FINDINGS: Dual lead pacemaker appears unchanged. Heart size is normal. The aorta is unfolded. Chronic interstitial pulmonary markings are again demonstrated consistent with chronic fibrosis. No sign of new focal infiltrate, collapse or effusion.  IMPRESSION: No change.  Dual lead pacemaker.  Pulmonary fibrosis pattern.   Electronically Signed   By: [name] M.D.   On: 03/30/2015 16:18   Dg Shoulder Right  03/31/2015   CLINICAL DATA:  Acute right shoulder pain.  EXAM: RIGHT SHOULDER - 2+ VIEW  COMPARISON:  None.  FINDINGS: No fracture, Hill-Sachs deformity, dislocation or suspicious focal osseous lesion in the right shoulder. 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 Right glenohumeral joint appears grossly normal on these limited views. No pathologic soft tissue calcifications.  IMPRESSION: 1. No fracture or dislocation in the right shoulder. 2. Mild right AC joint osteoarthritis.   Electronically Signed   By: [name] M.D.   On: 03/31/2015 15:00   Ct Head Wo Contrast  03/30/2015   CLINICAL DATA:  Initial encounter for left upper  extremity weakness.  EXAM: CT HEAD WITHOUT CONTRAST  TECHNIQUE: Contiguous axial images were obtained from the base of the skull through the vertex without intravenous contrast.  COMPARISON:  09/12/2014.  FINDINGS: There is no evidence for acute hemorrhage, hydrocephalus, mass lesion, or abnormal extra-axial fluid collection. No definite CT evidence for acute infarction. Patchy low attenuation in the deep hemispheric and periventricular white matter is nonspecific, but likely reflects chronic microvascular ischemic demyelination. Small hypodensities in the periventricular white matter are compatible with lacunar infarcts bilaterally and are unchanged. The visualized paranasal sinuses and mastoid air cells are clear.  IMPRESSION: No acute intracranial abnormality.  Chronic small vessel white matter ischemic disease.   Electronically Signed   By: Misty Stanley M.D.   On: 03/30/2015 16:30   Ct Angio Neck W/cm &/or Wo/cm  03/27/2015   CLINICAL DATA:  69 year old female with neck pain radiating to both shoulders and down the left arm. Symptoms for 4 days. Subsequent encounter.  EXAM: CT ANGIOGRAPHY NECK  TECHNIQUE: Multidetector CT imaging of the neck was performed using the standard protocol during bolus administration of intravenous contrast. Multiplanar CT image reconstructions and MIPs were obtained to evaluate the vascular anatomy. Carotid stenosis measurements (when applicable) are obtained utilizing NASCET criteria, using the distal internal carotid diameter as the denominator.  CONTRAST:  111m OMNIPAQUE IOHEXOL 350 MG/ML SOLN in conjunction with chest CTA reported separately.  COMPARISON:  Cervical spine CT 03/25/2015. Carotid Doppler ultrasound 12/15/2014. Brain MRI 04/22/2013.  FINDINGS: Aortic arch: Bovine arch configuration. Intermittent calcified plaque at the great vessel origins with no hemodynamically significant great vessel origin stenosis.  Right carotid system: Tortuous proximal right CCA.  Retropharyngeal course of the right CCA. Retropharyngeal right carotid bifurcation. Mild calcified plaque in the medial right ICA bulb. No associated stenosis. Negative visible right ICA siphon.  Left carotid system: Tortuous left CCA. Retropharyngeal course of the distal left CCA and left carotid bifurcation. Mostly soft plaque in the left ICA origin resulting in less than 50 % stenosis with respect to the distal vessel. Moderately tortuous cervical left ICA. Negative visible  left ICA siphon.  Vertebral arteries:Tortuous proximal right subclavian artery with a kinked appearance, otherwise no proximal subclavian stenosis. Streak and quantum mottle artifact at the right vertebral artery origin which appears grossly normal. No right vertebral artery stenosis to the skullbase. No intracranial vertebral artery stenosis. Patent vertebrobasilar junction with dominant appearing right AICA. Visible basilar artery is negative.  Mildly tortuous proximal left subclavian artery. Streak in quantum mottle artifact at the left vertebral artery origin which appears grossly normal. No vertebral artery stenosis in the neck. Mild calcified plaque in the left V4 segment with no associated stenosis. This is just proximal to the left PICA origin which remains patent. Normal left vertebrobasilar junction.  Skeleton: Large body habitus. Multilevel degenerative changes in the cervical spine. Trace anterolisthesis at C6-C7 with associated facet degeneration greater on the left. No acute osseous abnormality identified. Visualized paranasal sinuses and mastoids are clear.  Other neck: Chest findings today reported separately. Partially visible left chest cardiac pacemaker type device.  Thyromegaly, no discrete thyroid nodule identified. Larynx, pharynx, parapharyngeal spaces, retropharyngeal space (retropharyngeal course of both carotid), sublingual space, submandibular glands, and parotid glands are within normal limits. Negative visualized  brain parenchyma. No cervical lymphadenopathy.  IMPRESSION: 1. Mild for age carotid and vertebral artery atherosclerosis with no hemodynamically significant stenosis identified. Tortuous carotid arteries, both of which have a retropharyngeal course. 2. Large body habitus and chronic cervical spine degeneration. 3. Chest CTA from today reported separately.   Electronically Signed   By: Genevie Ann M.D.   On: 03/27/2015 18:05   Ct Cervical Spine Wo Contrast  03/25/2015   CLINICAL DATA:  Severe neck pain after waking up.  EXAM: CT CERVICAL SPINE WITHOUT CONTRAST  TECHNIQUE: Multidetector CT imaging of the cervical spine was performed without intravenous contrast. Multiplanar CT image reconstructions were also generated.  COMPARISON:  None.  FINDINGS: No acute fracture, endplate erosion, or focal bone lesion. No prevertebral edema.  Mild C6-7 anterolisthesis is associated with advanced facet arthropathy, the best explanation.  No indication of acute herniation and no notable canal stenosis. Degenerative disc disease is diffuse with endplate and uncovertebral ridging from C3-4 to C5-6. Ridging causes foraminal narrowing that is in general mild, except on the right at C3-4 where it is moderate to advanced.  Chronic interstitial opacity in the apical lungs, likely accentuated by expiratory phase. Tracheal flattening suggesting tracheomalacia.  Goiter.  IMPRESSION: 1. No acute finding. 2. Disc and facet degeneration as described above.   Electronically Signed   By: Monte Fantasia M.D.   On: 03/25/2015 05:18   Dg Chest Portable 1 View  03/27/2015   CLINICAL DATA:  Patient has central chest pain that began on Monday after getting a flu shot patient states. Patient also states that the pain began in her posterior skull and began to radiate forward into her chest and has worsened today.  EXAM: PORTABLE CHEST 1 VIEW  COMPARISON:  10/13/2014  FINDINGS: Left-sided transvenous pacemaker leads to the right atrium and right  ventricle. Heart is mildly enlarged. Mild pulmonary edema is present. There are no focal consolidations. No definite effusions. Study quality is compromised by portable technique and patient body habitus.  IMPRESSION: Mild pulmonary edema.   Electronically Signed   By: Nolon Nations M.D.   On: 03/27/2015 17:31   Dg Shoulder Left  03/31/2015   CLINICAL DATA:  Acute shoulder pain 9 days ago without trauma.  EXAM: LEFT SHOULDER - 2+ VIEW  COMPARISON:  None.  FINDINGS: Glenohumeral joint  is normal. No degeneration or subluxation. Normal humeral acromial distance. AC joint appears normal.  IMPRESSION: Negative.   Electronically Signed   By: [name] M.D.   On: 03/31/2015 15:32   Ct Angio Chest Aorta W/cm &/or Wo/cm  03/27/2015   CLINICAL DATA:  Neck pain radiating to shoulders and left arm.  EXAM: CT ANGIOGRAPHY CHEST WITH CONTRAST  TECHNIQUE: Multidetector CT imaging of the chest was performed using the standard protocol during bolus administration of intravenous contrast. Multiplanar CT image reconstructions and MIPs were obtained to evaluate the vascular anatomy.  CONTRAST:  135m OMNIPAQUE IOHEXOL 350 MG/ML SOLN  COMPARISON:  09/12/2014  FINDINGS: There is no evidence of aortic dissection, aneurysm, or intramural hematoma. The great vessels are grossly patent allowing for artifact at the thoracic inlet.  No obvious filling defect is present in the pulmonary arterial tree to suggest acute pulmonary thromboembolism. There are prominent central pulmonary arteries suggesting elevated pulmonary artery pressure  Calcified mediastinal nodes.  AICD device is in place.  No pneumothorax.  No pleural effusion.  Abnormal linear and ground-glass opacities throughout the lungs are not significantly changed. It is most prominent in the upper lung zones. This may be simply related to chronic or old granulomatous disease.  Innumerable thyroid lesions are seen bilaterally.  No acute bony deformity.  Postcholecystectomy.   Review of the MIP images confirms the above findings.  IMPRESSION: No evidence of aortic dissection or intramural hematoma.  Chronic pulmonary parenchymal disease is again noted. Interstitial lung disease or old granulomatous disease are considerations.  Multiple bilateral thyroid lesions are noted. Correlation with thyroid ultrasound is recommended.   Electronically Signed   By: AMarybelle KillingsM.D.   On: 03/27/2015 18:08    Microbiology: Recent Results (from the past 240 hour(s))  [facility]     Status: None   Collection Time: 04/01/15  8:41 AM  Result Value Ref Range Status   Streptococcus, Group A Screen (Direct) NEGATIVE NEGATIVE Final    Comment: (NOTE) A Rapid Antigen test may result negative if the antigen level in the sample is below the detection level of this test. The FDA has not cleared this test as a stand-alone test therefore the rapid antigen negative result has reflexed to a Group A Strep culture.      Labs: Basic Metabolic Panel:  Recent Labs Lab 03/27/15 1615 03/28/15 0415 03/30/15 1442 03/30/15 2211 04/01/15 1000 04/03/15 0408  NA 131* 133* 130* 131*  --  134*  K 3.7 4.0 3.8 4.1  --  4.5  CL 94* 97* 89* 90*  --  94*  CO2 _0 --  31  GLUCOSE 186* 151* 160* 197*  --  185*  BUN _1 --  44*  CREATININE 0.68 0.65 0.60 0.75  --  0.79  CALCIUM 9.1 9.0 9.6 9.3  --  9.2  MG  --   --   --   --  2.3  --    CBC:  Recent Labs Lab 03/27/15 1615 03/28/15 0415 03/30/15 1442 03/30/15 2211 04/03/15 0408  WBC 15.7* 15.7* 14.4* 15.8* 11.2*  NEUTROABS 14.0*  --  11.9*  --   --   HGB 11.7* 11.1* 12.4 12.9 11.3*  HCT 37.4 35.3 39.0 39.9 36.1  MCV 83.3 83.6 82.9 83.5 84.0  PLT 245 253 351 353 407*   Cardiac Enzymes:  Recent Labs Lab 03/28/15 0415 03/28/15 1014 03/30/15 1442 03/30/15 2211 03/31/15  2831 04/01/15 1000  CKTOTAL  --   --   --   --   --  42  TROPONINI 0.09* 0.14* 0.13* 0.11* 0.27*  --    BNP: BNP (last 3  results)  Recent Labs  03/30/15 1442  BNP 101.0*    ProBNP (last 3 results)  Recent Labs  05/01/14 1036 06/22/14 0952  PROBNP 44.0 45.0    CBG:  Recent Labs Lab 04/02/15 0729 04/02/15 1143 04/02/15 1614 04/02/15 2055 04/03/15 0723  GLUCAP 149* 181* 186* 217* 138*       Signed:  GHERGHE, COSTIN  [facility] 04/03/2015, 9:56 AM

## 2015-04-03 NOTE — Discharge Instructions (Signed)
Follow with [name], MD in 5-7 days  Please get a complete blood count and chemistry panel checked by your Primary MD at your next visit, and again as instructed by your Primary MD. Please get your medications reviewed and adjusted by your Primary MD.  Please request your Primary MD to go over all Hospital Tests and Procedure/Radiological results at the follow up, please get all Hospital records sent to your Prim MD by signing hospital release before you go home.  If you had Pneumonia of Lung problems at the Hospital: Please get a 2 view Chest X ray done in 6-8 weeks after hospital discharge or sooner if instructed by your Primary MD.  If you have Congestive Heart Failure: Please call your Cardiologist or Primary MD anytime you have any of the following symptoms:  1) 3 pound weight gain in 24 hours or 5 pounds in 1 week  2) shortness of breath, with or without a dry hacking cough  3) swelling in the hands, feet or stomach  4) if you have to sleep on extra pillows at night in order to breathe  Follow cardiac low salt diet and 1.5 lit/day fluid restriction.  If you have diabetes Accuchecks 4 times/day, Once in AM empty stomach and then before each meal. Log in all results and show them to your primary doctor at your next visit. If any glucose reading is under 80 or above 300 call your primary MD immediately.  If you have Seizure/Convulsions/Epilepsy: Please do not drive, operate heavy machinery, participate in activities at heights or participate in high speed sports until you have seen by Primary MD or a Neurologist and advised to do so again.  If you had Gastrointestinal Bleeding: Please ask your Primary MD to check a complete blood count within one week of discharge or at your next visit. Your endoscopic/colonoscopic biopsies that are pending at the time of discharge, will also need to followed by your Primary MD.  Get Medicines reviewed and adjusted. Please take all  your medications with you for your next visit with your Primary MD  Please request your Primary MD to go over all hospital tests and procedure/radiological results at the follow up, please ask your Primary MD to get all Hospital records sent to his/her office.  If you experience worsening of your admission symptoms, develop shortness of breath, life threatening emergency, suicidal or homicidal thoughts you must seek medical attention immediately by calling 911 or calling your MD immediately  if symptoms less severe.  You must read complete instructions/literature along with all the possible adverse reactions/side effects for all the Medicines you take and that have been prescribed to you. Take any new Medicines after you have completely understood and accpet all the possible adverse reactions/side effects.   Do not drive or operate heavy machinery when taking Pain medications.   Do not take more than prescribed Pain, Sleep and Anxiety Medications  Special Instructions: If you have smoked or chewed Tobacco  in the last 2 yrs please stop smoking, stop any regular Alcohol  and or any Recreational drug use.  Wear Seat belts while driving.  Please note You were cared for by a hospitalist during your hospital stay. If you have any questions about your discharge medications or the care you received while you were in the hospital after you are discharged, you can call the unit and asked to speak with the hospitalist on call if the hospitalist that took care of you is not available. Once  you are discharged, your primary care physician will handle any further medical issues. Please note that NO REFILLS for any discharge medications will be authorized once you are discharged, as it is imperative that you return to your primary care physician (or establish a relationship with a primary care physician if you do not have one) for your aftercare needs so that they can reassess your need for medications and monitor  your lab values.  You can reach the hospitalist office at phone [phone] or fax [phone]   If you do not have a primary care physician, you can call [phone] for a physician referral.  Activity: As tolerated with Full fall precautions use walker/cane & assistance as needed  Diet: heart healthy  Disposition Home

## 2015-04-03 NOTE — Progress Notes (Signed)
Inpatient Diabetes Program Recommendations  AACE/ADA: New Consensus Statement on Inpatient Glycemic Control (2015)  Target Ranges:  Prepandial:   less than 140 mg/dL      Peak postprandial:   less than 180 mg/dL (1-2 hours)      Critically ill patients:  140 - 180 mg/dL  Results for PEARLIE, DAS (MRN [id number]) as of 04/03/2015 08:20  Ref. Range 04/02/2015 07:29 04/02/2015 11:43 04/02/2015 16:14 04/02/2015 20:55 04/03/2015 07:23  Glucose-Capillary Latest Ref Range: 65-99 mg/dL 149 (H) 181 (H) 186 (H) 217 (H) 138 (H)   Review of Glycemic Control  Diabetes history: DM2 Outpatient Diabetes medications: Glipizide 2.5 mg QAM, Levemir 40 units QHS Current orders for Inpatient glycemic control: Levemir 30 units QHS, Novolog 0-15 units TID with meals  Inpatient Diabetes Program Recommendations: Correction (SSI): Please consider ordering Novolog bedtime correction scale.  Thanks, Barnie Alderman, RN, MSN, CCRN, CDE Diabetes Coordinator Inpatient Diabetes Program [phone] (Team Pager from [place] to [place]) [phone] ([facility] office) [phone] [facility] office) [phone] [facility] office)

## 2015-04-03 NOTE — Clinical Social Work Note (Signed)
Clinical Social Work Assessment  Patient Details  Name: Debra Saunders MRN: [id number] Date of Birth: 08/30/1945  Date of referral:  04/03/15               Reason for consult:  Facility Placement                Permission sought to share information with:  Chartered certified accountant granted to share information::  Yes, Advertising account executive::  SNF   Housing/Transportation Living arrangements for the past 2 months:  Single Family Home Source of Information:  Other (Comment Required) (Medical Record) Patient Interpreter Needed:  None Criminal Activity/Legal Involvement Pertinent to Current Situation/Hospitalization:  No - Comment as needed Significant Relationships:  Adult Children, Friend, Neighbor Lives with:  Adult Children Do you feel safe going back to the place where you live?  Yes Need for family participation in patient care:  Yes (Comment)  Care giving concerns:  Patient came from [facility] in [place]. Pt recommends that patient go to SNF for short-term rehab and patient is willing to go back to a SNF in [place].    Social Worker assessment / plan:  BSW intern met with patient at bedside to complete assessment. BSW intern explained the role of a Education officer, museum in patient's care. BSW intern explained that PT had recommended SNF for short-term rehab. BSW inter asked patient for permission to fax out patient information to SNFs in [facility] and patient agreed. Patient stated that she did not want BSW intern to send information to SNF facilities in [facility] because that was too far from her home and family. Patient was received from [facility] and [place] intern explained that the social worker would try and place her back there if a bed is available. Patient also stated that she did not wish to go to [facility] because she did not have a pleasant experience in the past. BSW intern to follow up with patient on bed  offers and facilitate discharge needs.    Employment status:  Retired Forensic scientist:  Medicare PT Recommendations:  [place] / Referral to community resources:  [employer]  Patient/Family's Response to care:  Patient was agreeable to SNF placement in [place] because she would be close to her family.  Patient/Family's Understanding of and Emotional Response to Diagnosis, Current Treatment, and Prognosis:  Patient understands that she has been recommended to SNF for short-term rehab needs. Patient appeared to understand her diagnosis and care post DC.  Emotional Assessment Appearance:  Appears stated age Attitude/Demeanor/Rapport:  Lethargic Affect (typically observed):  Flat, Quiet, Calm Orientation:  Oriented to Self, Oriented to Place, Oriented to  Time, Oriented to Situation Alcohol / Substance use:  Not Applicable Psych involvement (Current and /or in the community):  No (Comment)  Discharge Needs  Concerns to be addressed:  Discharge Planning Concerns Readmission within the last 30 days:  Yes Current discharge risk:  None Barriers to Discharge:  No Barriers Identified   [place] Intern, [phone]

## 2015-04-03 NOTE — Progress Notes (Signed)
Pts chart and consults reviewed. Pt appears to have musculoskeletal/joint pain from a likely rheumatologic process. Seems unlikely to have a radiculopathy. Agree with outpatient rheum f/u. Can f/u with me on a PRN basis.

## 2015-04-03 NOTE — Clinical Social Work Placement (Cosign Needed)
   CLINICAL SOCIAL WORK PLACEMENT  NOTE  Date:  04/03/2015  Patient Details  Name: Debra Saunders MRN: [id number] Date of Birth: [birth year]  Clinical Social Work is seeking post-discharge placement for this patient at the [place] level of care (*CSW will initial, date and re-position this form in  chart as items are completed):  Yes   Patient/family provided with [place] Work Department's list of facilities offering this level of care within the geographic area requested by the patient (or if unable, by the patient's family).  Yes   Patient/family informed of their freedom to choose among providers that offer the needed level of care, that participate in Medicare, Medicaid or managed care program needed by the patient, have an available bed and are willing to accept the patient.  Yes   Patient/family informed of [place]'s ownership interest in [facility] and [facility], as well as of the fact that they are under no obligation to receive care at these facilities.  PASRR submitted to EDS on       PASRR number received on       Existing PASRR number confirmed on 04/03/15     FL2 transmitted to all facilities in geographic area requested by pt/family on 04/03/15     FL2 transmitted to all facilities within larger geographic area on       Patient informed that his/her managed care company has contracts with or will negotiate with certain facilities, including the following:        Yes   Patient/family informed of bed offers received.  Patient chooses bed at [facility]     Physician recommends and patient chooses bed at      Patient to be transferred to [facility] on 04/03/15.  Patient to be transferred to facility by Ambulance     Patient family notified on 04/03/15 of transfer.  Name of family member notified:  [name]     PHYSICIAN Please sign FL2, Please prepare prescriptions     Additional  Comment:  Per MD, patient ready to discharge to [facility]. RN, patient/family, and facility notified of patient's discharge. RN given number for report. DC packet on chart. Ambulance transport requested. Family has completed necessary paperwork. BSW intern signing off.    _______________________________________________ Raynelle Highland BSW Intern, [id number]

## 2015-04-03 NOTE — Clinical Social Work Placement (Signed)
   CLINICAL SOCIAL WORK PLACEMENT  NOTE  Date:  04/03/2015  Patient Details  Name: AMABEL MCCAFFERY MRN: [id number] Date of Birth: 02-19-1946  Clinical Social Work is seeking post-discharge placement for this patient at the [place] level of care (*CSW will initial, date and re-position this form in  chart as items are completed):  Yes   Patient/family provided with [place] Work Department's list of facilities offering this level of care within the geographic area requested by the patient (or if unable, by the patient's family).  Yes   Patient/family informed of their freedom to choose among providers that offer the needed level of care, that participate in Medicare, Medicaid or managed care program needed by the patient, have an available bed and are willing to accept the patient.      Patient/family informed of [place]'s ownership interest in [facility] and [facility], as well as of the fact that they are under no obligation to receive care at these facilities.  PASRR submitted to EDS on       PASRR number received on       Existing PASRR number confirmed on       FL2 transmitted to all facilities in geographic area requested by pt/family on 04/03/15     FL2 transmitted to all facilities within larger geographic area on       Patient informed that his/her managed care company has contracts with or will negotiate with certain facilities, including the following:            Patient/family informed of bed offers received.  Patient chooses bed at       Physician recommends and patient chooses bed at      Patient to be transferred to   on  .  Patient to be transferred to facility by       Patient family notified on   of transfer.  Name of family member notified:        PHYSICIAN Please sign FL2, Please prepare prescriptions     Additional Comment:    _______________________________________________ Tedd Sias Intern,  [id number]

## 2015-04-03 NOTE — Clinical Social Work Placement (Signed)
   CLINICAL SOCIAL WORK PLACEMENT  NOTE  Date:  04/03/2015  Patient Details  Name: Debra Saunders MRN: [id number] Date of Birth: [birth year]  Clinical Social Work is seeking post-discharge placement for this patient at the [place] level of care (*CSW will initial, date and re-position this form in  chart as items are completed):  Yes   Patient/family provided with [place] Work Department's list of facilities offering this level of care within the geographic area requested by the patient (or if unable, by the patient's family).  Yes   Patient/family informed of their freedom to choose among providers that offer the needed level of care, that participate in Medicare, Medicaid or managed care program needed by the patient, have an available bed and are willing to accept the patient.  Yes   Patient/family informed of 's ownership interest in [facility] and [facility], as well as of the fact that they are under no obligation to receive care at these facilities.  PASRR submitted to EDS on       PASRR number received on 04/03/15     Existing PASRR number confirmed on       FL2 transmitted to all facilities in geographic area requested by pt/family on 04/03/15     FL2 transmitted to all facilities within larger geographic area on       Patient informed that his/her managed care company has contracts with or will negotiate with certain facilities, including the following:            Patient/family informed of bed offers received.  Patient chooses bed at       Physician recommends and patient chooses bed at      Patient to be transferred to   on  .  Patient to be transferred to facility by       Patient family notified on   of transfer.  Name of family member notified:        PHYSICIAN Please sign FL2, Please prepare prescriptions     Additional Comment:    _______________________________________________ Tedd Sias Intern, [id number]

## 2015-04-03 NOTE — Clinical Social Work Note (Signed)
DC Summary hard faxed to [facility]. Now waiting for authorization from [place] for patient to go to SNF.  Liz Beach MSW, [place], [place], [id number]

## 2015-04-04 ENCOUNTER — Ambulatory Visit: Payer: Commercial Managed Care - HMO | Admitting: Internal Medicine

## 2015-04-04 ENCOUNTER — Telehealth: Payer: Self-pay

## 2015-04-04 LAB — CULTURE, GROUP A STREP: Strep A Culture: NEGATIVE

## 2015-04-04 NOTE — Patient Outreach (Signed)
[facility]) Care Management  04/04/2015  Debra Saunders 1946-04-16 [id number]   Referral from [name], RN to assign SW and Charity fundraiser, assigned [employer], LCSW and Yarnell Minor, Therapist, sports.  Thanks, Ronnell Freshwater. Nettie, [place] Assistant Phone: [phone] Fax: [phone]

## 2015-04-04 NOTE — Telephone Encounter (Signed)
TCM call x2 made.  Spoke with daughter [name]) and was told patient is at [facility] in [place].  Expectations for discharge is 2 weeks.  Appointment made for 04/18/15 at 1130.  Family to call and reschedule if released sooner or later than expected.  New TCM call to be made when discharged home.

## 2015-04-04 NOTE — Consult Note (Signed)
   [facility] CM Inpatient Consult   04/04/2015  Debra Saunders 10/29/1945 [id number] Late entry for 04/03/15: Met with the patient at bedside for [facility] Care Management services. Patient evaluated for community based chronic disease management services with [facility] Care Management Program as a benefit of patient's Medicare Insurance. Spoke with patient at bedside to explain [facility] Care Management services.  Patient will receive post discharge follow up at the facility and  will be evaluated for monthly home visits for assessments and disease process education.  Left contact information and [facility] literature at bedside. Consent form signed.   Patient endorses that she lives alone but has a daughter that works in the area and checks on her and her oldest daughter lives in [place].  Patient is to discharge to a skilled nursing facility for short term rehab and to return home.  Made Inpatient Case Manager aware that [facility] Care Management following. Of note, [facility] Care Management services does not replace or interfere with any services that are arranged by inpatient case management or social work.  For additional questions or referrals please contact:   Victoria Brewer, RN BSN CCM [facility] Liaison  [phone] business mobile phone   

## 2015-04-07 DIAGNOSIS — E119 Type 2 diabetes mellitus without complications: Secondary | ICD-10-CM | POA: Diagnosis not present

## 2015-04-07 DIAGNOSIS — I10 Essential (primary) hypertension: Secondary | ICD-10-CM | POA: Diagnosis not present

## 2015-04-07 DIAGNOSIS — M25519 Pain in unspecified shoulder: Secondary | ICD-10-CM | POA: Diagnosis not present

## 2015-04-07 DIAGNOSIS — I509 Heart failure, unspecified: Secondary | ICD-10-CM | POA: Diagnosis not present

## 2015-04-07 DIAGNOSIS — E784 Other hyperlipidemia: Secondary | ICD-10-CM | POA: Diagnosis not present

## 2015-04-07 DIAGNOSIS — E039 Hypothyroidism, unspecified: Secondary | ICD-10-CM | POA: Diagnosis not present

## 2015-04-07 DIAGNOSIS — J9600 Acute respiratory failure, unspecified whether with hypoxia or hypercapnia: Secondary | ICD-10-CM | POA: Diagnosis not present

## 2015-04-12 ENCOUNTER — Ambulatory Visit: Payer: Commercial Managed Care - HMO | Admitting: Cardiovascular Disease

## 2015-04-12 ENCOUNTER — Other Ambulatory Visit: Payer: Self-pay | Admitting: *Deleted

## 2015-04-13 NOTE — Patient Outreach (Signed)
[name] [facility]) Care Management  [facility] Social Work  04/13/2015  Debra Saunders Nov 12, 1945 [id number]  Subjective:  "I want to do well here so I can go home"  Objective:   Current Medications:  Current Outpatient Prescriptions  Medication Sig Dispense Refill  . acetaminophen (TYLENOL) 325 MG tablet Take 325-650 mg by mouth every 6 (six) hours as needed for mild pain.     [name] ambrisentan (LETAIRIS) 5 MG tablet Take 1 tablet (5 mg total) by mouth daily. 30 tablet 5  . cyclobenzaprine (FLEXERIL) 5 MG tablet Take 1 tablet (5 mg total) by mouth 3 (three) times daily as needed for muscle spasms. 30 tablet 0  . diazepam (VALIUM) 5 MG tablet Take 1 tablet (5 mg total) by mouth every 8 (eight) hours as needed for muscle spasms (and/or neck pain). 30 tablet 0  . glipiZIDE (GLUCOTROL) 5 MG tablet Take 0.5 tablets (2.5 mg total) by mouth daily before breakfast. 60 tablet 3  . insulin detemir (LEVEMIR) 100 UNIT/ML injection Inject 0.4 mLs (40 Units total) into the skin at bedtime. 10 mL 5  . meclizine (ANTIVERT) 25 MG tablet Take 25 mg by mouth daily as needed for dizziness.    . Menthol, Topical Analgesic, (BIOFREEZE EX) Apply 1 application topically as needed (for pain).     . naproxen (EC NAPROSYN) 500 MG EC tablet Take 500 mg by mouth 2 (two) times daily as needed (for pain).    . ondansetron (ZOFRAN) 8 MG tablet Take 1 tablet (8 mg total) by mouth every 8 (eight) hours as needed for nausea or vomiting. 30 tablet 2  . oxyCODONE-acetaminophen (PERCOCET/ROXICET) 5-325 MG tablet Take 1 tablet by mouth every 8 (eight) hours as needed for severe pain. 30 tablet 0  . polyethylene glycol (MIRALAX / GLYCOLAX) packet Take 17 g by mouth daily as needed for moderate constipation.    . potassium chloride SA (KLOR-CON M20) 20 MEQ tablet Take 1 tablet (20 mEq total) by mouth 2 (two) times daily. 60 tablet 6  . predniSONE (DELTASONE) 20 MG tablet Take 2 tablets (40 mg total) by mouth daily with  breakfast. 40 mg daily for 3 days then 20 mg daily for 3 days then 10 mg daily for 4 days 11 tablet   . promethazine (PHENERGAN) 25 MG tablet Take 25 mg by mouth every 4 (four) hours as needed for nausea or vomiting.     . torsemide (DEMADEX) 20 MG tablet Take 1 tablet (20 mg total) by mouth 2 (two) times daily. (Patient taking differently: Take 20 mg by mouth daily. ) 60 tablet 6  . verapamil (CALAN-SR) 240 MG CR tablet Take 240 mg by mouth daily.     No current facility-administered medications for this visit.    Functional Status:  In your present state of health, do you have any difficulty performing the following activities: 04/12/2015 03/27/2015  Hearing? N N  Vision? Y N  Difficulty concentrating or making decisions? N N  Walking or climbing stairs? Y Y  Dressing or bathing? Y N  Doing errands, shopping? Debra Saunders  Preparing Food and eating ? N -  Using the Toilet? N -  In the past six months, have you accidently leaked urine? Y -  Do you have problems with loss of bowel control? N -  Managing your Medications? Y -  Managing your Finances? Y -  Housekeeping or managing your Housekeeping? Y -    Fall/Depression Screening:  PHQ 2/9 Scores 04/12/2015  06/29/2014 10/06/2013 07/09/2012 04/28/2012  PHQ - 2 Score 0 0 0 0 0    Assessment:  This Education officer, museum met with the discharge planner Debra Saunders at [employer].  Per Janett Billow, patient doing well in treatment.  Patient lives alone, however  has a supportive family that visits regularly.  No discharge date set at this time.  Met with patient in her room after her physical therapy.  Daughter Debra Saunders in the room as well and will be residing with patient post discharge.   Patient very motivated,states plan to participate fully in physical and occupational therapy in order to return home as soon as possible.  No anticipated barriers at this time.  Patient's daughter to reside with patient post discharge from the facility and will assist with  transportation to medical appointments.   Plan:  This Education officer, museum will follow up with patient and her progress in treatment in one week.     [facility] CM Care Plan Problem One        Most Recent Value   Care Plan Problem One  patient currently in skilled nursing facility   Role Documenting the Problem One  [place] for Problem One  Active   [facility] CM Short Term Goal #1 (0-30 days)  patient and social worker to work closely with discharge planner to ensure safe discharge within 30 days   [facility] CM Short Term Goal #1 Start Date  04/12/15   Interventions for Short Term Goal #1  this Education officer, museum met with discharge planner regarding patient's progress in treatment   [facility] CM Short Term Goal #2 (0-30 days)  patient will actively participate in physical and occupational therapy for the next 30 days   [facility] CM Short Term Goal #2 Start Date  04/12/15   Interventions for Short Term Goal #2  social worker encouraged continued active participation while in treatment     [place], [place] Management [phone]

## 2015-04-15 DIAGNOSIS — M25519 Pain in unspecified shoulder: Secondary | ICD-10-CM | POA: Diagnosis not present

## 2015-04-15 DIAGNOSIS — I10 Essential (primary) hypertension: Secondary | ICD-10-CM | POA: Diagnosis not present

## 2015-04-15 DIAGNOSIS — I509 Heart failure, unspecified: Secondary | ICD-10-CM | POA: Diagnosis not present

## 2015-04-15 DIAGNOSIS — E039 Hypothyroidism, unspecified: Secondary | ICD-10-CM | POA: Diagnosis not present

## 2015-04-15 DIAGNOSIS — J9600 Acute respiratory failure, unspecified whether with hypoxia or hypercapnia: Secondary | ICD-10-CM | POA: Diagnosis not present

## 2015-04-15 DIAGNOSIS — E119 Type 2 diabetes mellitus without complications: Secondary | ICD-10-CM | POA: Diagnosis not present

## 2015-04-15 DIAGNOSIS — E784 Other hyperlipidemia: Secondary | ICD-10-CM | POA: Diagnosis not present

## 2015-04-18 ENCOUNTER — Ambulatory Visit: Payer: Commercial Managed Care - HMO | Admitting: Internal Medicine

## 2015-04-23 ENCOUNTER — Ambulatory Visit (INDEPENDENT_AMBULATORY_CARE_PROVIDER_SITE_OTHER): Payer: Commercial Managed Care - HMO | Admitting: *Deleted

## 2015-04-23 ENCOUNTER — Telehealth: Payer: Self-pay | Admitting: Internal Medicine

## 2015-04-23 ENCOUNTER — Ambulatory Visit: Payer: Commercial Managed Care - HMO | Admitting: *Deleted

## 2015-04-23 ENCOUNTER — Telehealth: Payer: Self-pay

## 2015-04-23 DIAGNOSIS — I10 Essential (primary) hypertension: Secondary | ICD-10-CM | POA: Diagnosis not present

## 2015-04-23 DIAGNOSIS — D649 Anemia, unspecified: Secondary | ICD-10-CM | POA: Diagnosis not present

## 2015-04-23 DIAGNOSIS — E119 Type 2 diabetes mellitus without complications: Secondary | ICD-10-CM | POA: Diagnosis not present

## 2015-04-23 DIAGNOSIS — I495 Sick sinus syndrome: Secondary | ICD-10-CM | POA: Diagnosis not present

## 2015-04-23 DIAGNOSIS — I509 Heart failure, unspecified: Secondary | ICD-10-CM | POA: Diagnosis not present

## 2015-04-23 NOTE — Telephone Encounter (Signed)
Transition Care Management Follow-up Telephone Call   Date discharged? 04/23/15   How have you been since you were released from the hospital?  Ambulating with walker. Resting well. Blood sugar was running at 78, but a little higher now. C/O some L shoulder pain.     Do you understand why you were in the hospital? Yes and rehab.   Do you understand the discharge instructions? Yes and what exercises and goals that need to be met.   Where were you discharged to? Home   Items Reviewed:  Medications reviewed: Yes  Allergies reviewed: Yes  Dietary changes reviewed: Yes  Referrals reviewed: Yes   Functional Questionnaire:   Activities of Daily Living (ADLs):   She states they are independent in the following: Self feeds, grooming, toileting. States they require assistance with the following: Ambulating (with walker), dressing, bathing.  Home health nurse and daughters to assist.   Any transportation issues/concerns?: No, daughter Nicole Kindred") to bring.   Any patient concerns? Some anxious, nervousness.   Confirmed importance and date/time of follow-up visits scheduled Yes, appointment scheduled 04/25/15 at 4:30.  Daughter verbalized understanding of importance.  Provider Appointment booked with Dr. Gilford Rile (PCP).  Confirmed with patient if condition begins to worsen call PCP or go to the ER.  Patient was given the office number and encouraged to call back with question or concerns.  : Yes, daughter Nicole Kindred") verbalized understanding.

## 2015-04-23 NOTE — Telephone Encounter (Signed)
Thank you. I will follow up with the patient

## 2015-04-23 NOTE — Telephone Encounter (Signed)
FYI, pt is being discharged today at about 1pm. Diagnosis is UTI/COPD. No appt avail to sch. Please let me know where to sch. Thank You!

## 2015-04-24 ENCOUNTER — Other Ambulatory Visit: Payer: Self-pay | Admitting: *Deleted

## 2015-04-24 NOTE — Patient Outreach (Signed)
[name]) Care Management  04/24/2015  Debra Saunders 10/31/1945 [id number]   Patient referred to [facility]. Post discharge nutrition program through [facility]  04/24/15 at 5:00pm.    Patient to receive 10 days of frozen meals.  Meals to be delivered on Monday, April 30, 2015.  Order# [id number]   This social worker to continue to assess for social work needs.    [name] [facility] Care Management [phone]

## 2015-04-24 NOTE — Patient Outreach (Addendum)
[facility]) Care Management  04/24/2015  Debra Saunders 08-02-1945 [id number]   Phone call to Donnalee Curry, discharge planner at [facility] on 04/23/15 at 11:00am.  Per Janett Billow, patient will discharge home today 04/23/15 with home health through [facility] home health .  Per Janett Billow, patient's insurance would no longer cover patient's stay a [facility].  Patient's family felt that the discharge was pre-mature and filed an appeal, however the discharge date what upheld.  Plan:  This social worker will inform RNCM of patient's discharge.            This Education officer, museum will call patient at home.    [name] [facility] Care Management [phone]

## 2015-04-25 ENCOUNTER — Ambulatory Visit (INDEPENDENT_AMBULATORY_CARE_PROVIDER_SITE_OTHER): Payer: Commercial Managed Care - HMO | Admitting: Internal Medicine

## 2015-04-25 ENCOUNTER — Encounter: Payer: Self-pay | Admitting: Internal Medicine

## 2015-04-25 ENCOUNTER — Other Ambulatory Visit: Payer: Self-pay | Admitting: *Deleted

## 2015-04-25 VITALS — BP 116/64 | HR 66 | Temp 98.1°F | Ht 61.0 in | Wt 230.4 lb

## 2015-04-25 DIAGNOSIS — E118 Type 2 diabetes mellitus with unspecified complications: Secondary | ICD-10-CM

## 2015-04-25 DIAGNOSIS — E041 Nontoxic single thyroid nodule: Secondary | ICD-10-CM

## 2015-04-25 DIAGNOSIS — M5010 Cervical disc disorder with radiculopathy, unspecified cervical region: Secondary | ICD-10-CM

## 2015-04-25 DIAGNOSIS — E876 Hypokalemia: Secondary | ICD-10-CM | POA: Diagnosis not present

## 2015-04-25 DIAGNOSIS — E049 Nontoxic goiter, unspecified: Secondary | ICD-10-CM | POA: Insufficient documentation

## 2015-04-25 DIAGNOSIS — Z794 Long term (current) use of insulin: Secondary | ICD-10-CM

## 2015-04-25 MED ORDER — GABAPENTIN 100 MG PO CAPS: 100.0000 mg | ORAL_CAPSULE | Freq: Three times a day (TID) | ORAL | Status: DC

## 2015-04-25 NOTE — Addendum Note (Signed)
Addended by: Ronette Deter A on: 04/25/2015 04:10 PM   Modules accepted: Level of Service

## 2015-04-25 NOTE — Progress Notes (Signed)
Remote pacemaker transmission.   

## 2015-04-25 NOTE — Patient Outreach (Signed)
RNCM made first transition of care call since pt d/c from [facility] on 10/31. Transition of care flowsheet completed. Pt stating she thought she was supposed to have PT/OT from [facility] agency but had not heard from the company. Pt stated she had all medications and transportation to her appts. Pt stating her daughter manages her medications. Pt has already seen pcp for post SNF d/c follow up. Pt stated she was continuing to have difficulty with numbness to her left side and neck pain. RNCM asked pt if she would be open to a home visit from [facility] and she was agreeable as long as it was a time her daughter was there. Appointment set up for next week. RNCM made pt aware she would call [facility] and inquire about [facility] services. RNCM confirmed pt had her number in case she needed to contact her.  RNCM called [facility] and spoke with Tammy about pt's [place] orders. Tammy stated pt was to have [place] provided by [facility]. Tammy stated she would contact the facility SW and find out when the pt was supposed to be contacted. Given the late hour SW had left for the day but Tammy planned to take care of this first thing in the am.   Plan: RNCM will contact [facility] tomorrow to f/u if no call back received.  RNCM will see pt in her home next week.   Rutherford Limerick RN, BSN  [facility] Care Management [phone])

## 2015-04-25 NOTE — Assessment & Plan Note (Signed)
Symptoms are most consistent with cervical radiculopathy. Reviewed imaging from recent hospitalization. Question if CT myelogram would be helpful. Pt unable to have MRI because of cardiac pacer. Question if EMG would be helpful. Will set up neurology evaluation. Start Gabapentin 100mg  po tid. Discussed potential risks of this medication. Continue Flexeril and/or Oxycodone for severe pain. Follow up 4 weeks and prn.

## 2015-04-25 NOTE — Assessment & Plan Note (Signed)
Nodules noted on CT neck. Will plan to follow up with thyroid US at later date, when pain symptoms better controlled.

## 2015-04-25 NOTE — Progress Notes (Addendum)
Subjective:    Patient ID: Debra Saunders, female    DOB: [birth year], 69 y.o.   MRN: [id number]  HPI  69YO female presents for hospital follow up.  ADMITTED: 03/30/2015 DISCHARGED: 04/03/2015  DISCHARGED FROM REHAB: 04/23/2015  DIAGNOSIS: Cervical radiculopathy  Presented to [facility] ED with LUE pain and weakness. Question of cervical radiculopathy. Evaluated by neurosurgery and given steroids. Recommended referral to rheumatology. XRAY of shoulders normal.  Continues to have severe left shoulder pain. This radiates up into neck. Had weakness in left arm, but this has improved. Fingertips have decreased sensation bilaterally. Has weaned off Prednisone.  Took Flexeril today with slight improvement.  Patient and daughter report BG have been well controlled, in low 100s. No recent use of Novolog sliding scale. Compliant with Levemir.   Wt Readings from Last 3 Encounters:  04/25/15 230 lb 6 oz (104.497 kg)  04/03/15 226 lb 6.6 oz (102.7 kg)  03/30/15 240 lb (108.863 kg)   BP Readings from Last 3 Encounters:  04/25/15 116/64  04/03/15 112/59  03/30/15 155/106    Past Medical History  Diagnosis Date  . Thyroid disorder   . Diabetes mellitus ([place])     type II  . Thyroid nodule   . Sleep apnea   . Obesity   . Hypertension   . Hyperlipidemia   . Urinary incontinence   . CHF (congestive heart failure) ([place]) 2005    Dx at [facility]  . Chronic back pain   . COPD (chronic obstructive pulmonary disease) ([place])     2L [place]  . Sleep apnea   . Lipoma of back   . Arthritis   . Streptococcal bacteremia   . Acute renal failure ([place])   . Stroke ([place])   . Sepsis with metabolic encephalopathy ([place])   . Gastritis   . Symptomatic bradycardia   . Chronic respiratory failure (HCC)     a. on home O2   Family History  Problem Relation Age of Onset  . Heart disease Mother   . Hypertension Mother   . COPD Mother     was a smoker  . Thyroid disease Mother   . Hypertension Father   .  Hypertension Sister   . Thyroid disease Sister   . Hypertension Sister   . Thyroid disease Sister   . Breast cancer Maternal Aunt    Past Surgical History  Procedure Laterality Date  . Cholecystectomy    . Vaginal hysterectomy    . Cardiac catheterization  2013    @ [place]: No obstructive CAD: Only 20% ostial left circumflex.  . Abdominal hysterectomy  1969  . Cyst removal trunk  2002    BACK  . Lipoma removal  2014    back  . Pacemaker insertion  2015    [place] dual chamber pacemaker implanted by Dr Caryl Comes for symptomatic bradycardia  . Insert / replace / remove pacemaker    . Permanent pacemaker insertion N/A 01/09/2014    Procedure: PERMANENT PACEMAKER INSERTION;  Surgeon: Deboraha Sprang, MD;  Location: [facility] CATH LAB;  Service: Cardiovascular;  Laterality: N/A;   Social History   Social History  . Marital Status: Divorced    Spouse Name: N/A  . Number of Children: N/A  . Years of Education: N/A   Occupational History  . Retired- factory work     exposed to [place]  . Smoking status: Former Smoker -- 0.30 packs/day for 2 years  Types: Cigarettes    Quit date: 06/23/1990  . Smokeless tobacco: Never Used  . Alcohol Use: No  . Drug Use: No  . Sexual Activity: Not Asked   Other Topics Concern  . None   Social History Narrative   Lives in [place] with daughter. Has 4 children. No pets.      Work - Restaurant manager, fast food, retired      Diet - regular    Review of Systems  Constitutional: Negative for fever, chills, appetite change, fatigue and unexpected weight change.  Eyes: Negative for visual disturbance.  Respiratory: Negative for shortness of breath.   Cardiovascular: Negative for chest pain and leg swelling.  Gastrointestinal: Negative for abdominal pain.  Musculoskeletal: Positive for myalgias, back pain, arthralgias and neck pain.  Skin: Negative for color change and rash.  Neurological: Positive for weakness and  numbness. Negative for dizziness, syncope, facial asymmetry and headaches.  Hematological: Negative for adenopathy. Does not bruise/bleed easily.  Psychiatric/Behavioral: Negative for dysphoric mood. The patient is not nervous/anxious.        Objective:    BP 116/64 mmHg  Pulse 66  Temp(Src) 98.1 F (36.7 C) (Oral)  Ht 5\' 1"  (1.549 m)  Wt 230 lb 6 oz (104.497 kg)  BMI 43.55 kg/m2  SpO2 95% Physical Exam  Constitutional: She is oriented to person, place, and time. She appears well-developed and well-nourished. No distress.  HENT:  Head: Normocephalic and atraumatic.  Right Ear: External ear normal.  Left Ear: External ear normal.  Nose: Nose normal.  Mouth/Throat: Oropharynx is clear and moist. No oropharyngeal exudate.  Eyes: Conjunctivae are normal. Pupils are equal, round, and reactive to light. Right eye exhibits no discharge. Left eye exhibits no discharge. No scleral icterus.  Neck: Normal range of motion. Neck supple. No tracheal deviation present. No thyromegaly present.  Cardiovascular: Normal rate, regular rhythm, normal heart sounds and intact distal pulses.  Exam reveals no gallop and no friction rub.   No murmur heard. Pulmonary/Chest: Effort normal and breath sounds normal. No respiratory distress. She has no wheezes. She has no rales. She exhibits no tenderness.  Musculoskeletal: Normal range of motion. She exhibits no edema.       Right elbow: She exhibits normal range of motion and no swelling.       Left elbow: She exhibits normal range of motion and no swelling. Tenderness (diffuse mild tenderness) found.       Arms: Left biceps, triceps 4/5. Right biceps, triceps 5/5. Grip strength 5/5 bilaterally. Sensation intact to light touch bilaterally.  Lymphadenopathy:    She has no cervical adenopathy.  Neurological: She is alert and oriented to person, place, and time. No cranial nerve deficit. She exhibits normal muscle tone. Coordination normal.  Skin: Skin is warm  and dry. No rash noted. She is not diaphoretic. No erythema. No pallor.  Psychiatric: She has a normal mood and affect. Her behavior is normal. Judgment and thought content normal.          Assessment & Plan:   Problem List Items Addressed This Visit      Unprioritized   Cervical disc disorder with radiculopathy of cervical region - Primary    Symptoms are most consistent with cervical radiculopathy. Reviewed imaging from recent hospitalization. Question if CT myelogram would be helpful. Pt unable to have MRI because of cardiac pacer. Question if EMG would be helpful. Will set up neurology evaluation. Start Gabapentin 100mg  po tid. Discussed potential risks of this medication. Continue Flexeril  and/or Oxycodone for severe pain. Follow up 4 weeks and prn.      Relevant Medications   gabapentin (NEURONTIN) 100 MG capsule   Other Relevant Orders   Ambulatory referral to Neurology   Diabetes mellitus type 2, controlled ([place]) (Chronic)    BG well controlled per pt report. Will continue Levemir and prn Novolog based on sliding scale.      Thyroid nodule    Nodules noted on CT neck. Will plan to follow up with thyroid US at later date, when pain symptoms better controlled.       Other Visit Diagnoses    Hypokalemia        Relevant Orders    Basic Metabolic Panel (BMET)        Return in about 4 weeks (around 05/23/2015).

## 2015-04-25 NOTE — Assessment & Plan Note (Signed)
BG well controlled per pt report. Will continue Levemir and prn Novolog based on sliding scale.

## 2015-04-25 NOTE — Patient Instructions (Addendum)
Start Gabapentin 100mg  three times daily.  Continue Flexeril or Percocet as needed for severe pain only.  We will set up evaluation with Dr. Melrose Nakayama.  Labs today.

## 2015-04-25 NOTE — Progress Notes (Signed)
Pre visit review using our clinic review tool, if applicable. No additional management support is needed unless otherwise documented below in the visit note. 

## 2015-04-26 ENCOUNTER — Other Ambulatory Visit: Payer: Self-pay | Admitting: *Deleted

## 2015-04-26 DIAGNOSIS — G4733 Obstructive sleep apnea (adult) (pediatric): Secondary | ICD-10-CM | POA: Diagnosis not present

## 2015-04-26 DIAGNOSIS — M5010 Cervical disc disorder with radiculopathy, unspecified cervical region: Secondary | ICD-10-CM | POA: Diagnosis not present

## 2015-04-26 DIAGNOSIS — N186 End stage renal disease: Secondary | ICD-10-CM | POA: Diagnosis not present

## 2015-04-26 DIAGNOSIS — E1122 Type 2 diabetes mellitus with diabetic chronic kidney disease: Secondary | ICD-10-CM | POA: Diagnosis not present

## 2015-04-26 DIAGNOSIS — I132 Hypertensive heart and chronic kidney disease with heart failure and with stage 5 chronic kidney disease, or end stage renal disease: Secondary | ICD-10-CM | POA: Diagnosis not present

## 2015-04-26 DIAGNOSIS — J449 Chronic obstructive pulmonary disease, unspecified: Secondary | ICD-10-CM | POA: Diagnosis not present

## 2015-04-26 DIAGNOSIS — J9611 Chronic respiratory failure with hypoxia: Secondary | ICD-10-CM | POA: Diagnosis not present

## 2015-04-26 DIAGNOSIS — I5030 Unspecified diastolic (congestive) heart failure: Secondary | ICD-10-CM | POA: Diagnosis not present

## 2015-04-26 DIAGNOSIS — M1990 Unspecified osteoarthritis, unspecified site: Secondary | ICD-10-CM | POA: Diagnosis not present

## 2015-04-26 LAB — BASIC METABOLIC PANEL WITH GFR
BUN: 9 mg/dL (ref 6–23)
CO2: 30 meq/L (ref 19–32)
Calcium: 8.9 mg/dL (ref 8.4–10.5)
Chloride: 100 meq/L (ref 96–112)
Creatinine, Ser: 0.81 mg/dL (ref 0.40–1.20)
GFR: 89.43 mL/min
Glucose, Bld: 119 mg/dL — ABNORMAL HIGH (ref 70–99)
Potassium: 3.9 meq/L (ref 3.5–5.1)
Sodium: 138 meq/L (ref 135–145)

## 2015-04-26 NOTE — Patient Outreach (Signed)
RNCM received a call from Debra Saunders at [facility] stating she had contacted the company [employer] who would be providing the pt Debra Saunders services and they were going to call the pt today.   RNCM called pt to make her aware someone should be contacting her today and she stated someone had already called. Pt stated the Debra Saunders nurse was scheduled to come out today between 12 and 1pm.   Pt stated she was still hurting but was trying the new medications from Dr. Gilford Rile and had hopes that it was going to help.   RNCM asked pt to please let her know if [facility] nurse does not come, pt verbalized understanding.  Plan: RNCM will see pt next week as part of the [facility] program.   Veer Elamin RN, BSN  [facility] Care Management [phone])

## 2015-04-27 DIAGNOSIS — M5010 Cervical disc disorder with radiculopathy, unspecified cervical region: Secondary | ICD-10-CM | POA: Diagnosis not present

## 2015-04-27 DIAGNOSIS — I132 Hypertensive heart and chronic kidney disease with heart failure and with stage 5 chronic kidney disease, or end stage renal disease: Secondary | ICD-10-CM | POA: Diagnosis not present

## 2015-04-27 DIAGNOSIS — J9611 Chronic respiratory failure with hypoxia: Secondary | ICD-10-CM | POA: Diagnosis not present

## 2015-04-27 DIAGNOSIS — M1990 Unspecified osteoarthritis, unspecified site: Secondary | ICD-10-CM | POA: Diagnosis not present

## 2015-04-27 DIAGNOSIS — J449 Chronic obstructive pulmonary disease, unspecified: Secondary | ICD-10-CM | POA: Diagnosis not present

## 2015-04-27 DIAGNOSIS — E1122 Type 2 diabetes mellitus with diabetic chronic kidney disease: Secondary | ICD-10-CM | POA: Diagnosis not present

## 2015-04-27 DIAGNOSIS — N186 End stage renal disease: Secondary | ICD-10-CM | POA: Diagnosis not present

## 2015-04-27 DIAGNOSIS — G4733 Obstructive sleep apnea (adult) (pediatric): Secondary | ICD-10-CM | POA: Diagnosis not present

## 2015-04-27 DIAGNOSIS — I5030 Unspecified diastolic (congestive) heart failure: Secondary | ICD-10-CM | POA: Diagnosis not present

## 2015-04-27 LAB — CUP PACEART REMOTE DEVICE CHECK
Battery Remaining Longevity: 156 mo
Battery Remaining Percentage: 100 %
Brady Statistic RA Percent Paced: 36 %
Brady Statistic RV Percent Paced: 0 %
Date Time Interrogation Session: 20161031174000
Implantable Lead Implant Date: 20150720
Implantable Lead Implant Date: 20150720
Implantable Lead Location: 753859
Implantable Lead Location: 753860
Implantable Lead Model: 5076
Implantable Lead Model: 5076
Lead Channel Impedance Value: 367 Ohm
Lead Channel Impedance Value: 561 Ohm
Lead Channel Sensing Intrinsic Amplitude: 14.1 mV
Lead Channel Sensing Intrinsic Amplitude: 2.8 mV
Lead Channel Setting Pacing Amplitude: 2 V
Lead Channel Setting Pacing Amplitude: 2.4 V
Lead Channel Setting Pacing Pulse Width: 0.4 ms
Lead Channel Setting Sensing Sensitivity: 2.5 mV
Pulse Gen Serial Number: 390330

## 2015-04-30 ENCOUNTER — Encounter: Payer: Self-pay | Admitting: Cardiology

## 2015-05-01 ENCOUNTER — Telehealth: Payer: Self-pay | Admitting: *Deleted

## 2015-05-01 ENCOUNTER — Other Ambulatory Visit: Payer: Self-pay | Admitting: *Deleted

## 2015-05-01 ENCOUNTER — Encounter: Payer: Self-pay | Admitting: *Deleted

## 2015-05-01 VITALS — BP 138/78 | HR 88 | Resp 18 | Ht 61.0 in | Wt 230.0 lb

## 2015-05-01 DIAGNOSIS — Z741 Need for assistance with personal care: Secondary | ICD-10-CM

## 2015-05-01 DIAGNOSIS — M1990 Unspecified osteoarthritis, unspecified site: Secondary | ICD-10-CM | POA: Diagnosis not present

## 2015-05-01 DIAGNOSIS — M5010 Cervical disc disorder with radiculopathy, unspecified cervical region: Secondary | ICD-10-CM | POA: Diagnosis not present

## 2015-05-01 DIAGNOSIS — I5030 Unspecified diastolic (congestive) heart failure: Secondary | ICD-10-CM | POA: Diagnosis not present

## 2015-05-01 DIAGNOSIS — E1122 Type 2 diabetes mellitus with diabetic chronic kidney disease: Secondary | ICD-10-CM | POA: Diagnosis not present

## 2015-05-01 DIAGNOSIS — N186 End stage renal disease: Secondary | ICD-10-CM | POA: Diagnosis not present

## 2015-05-01 DIAGNOSIS — J449 Chronic obstructive pulmonary disease, unspecified: Secondary | ICD-10-CM | POA: Diagnosis not present

## 2015-05-01 DIAGNOSIS — G4733 Obstructive sleep apnea (adult) (pediatric): Secondary | ICD-10-CM | POA: Diagnosis not present

## 2015-05-01 DIAGNOSIS — I132 Hypertensive heart and chronic kidney disease with heart failure and with stage 5 chronic kidney disease, or end stage renal disease: Secondary | ICD-10-CM | POA: Diagnosis not present

## 2015-05-01 DIAGNOSIS — J9611 Chronic respiratory failure with hypoxia: Secondary | ICD-10-CM | POA: Diagnosis not present

## 2015-05-01 NOTE — Patient Outreach (Signed)
[facility]) Care Management   05/01/2015  Debra Saunders 13-Sep-1945 [id number]  Debra Saunders is an 69 y.o. female  Subjective: " I have been in so much pain I can't do any of my normal activities. I am supposed to see Dr. Melrose Nakayama but I have not heard from his office yet." " I know I have heart failure but I feel like I need to know more about how to look for signs and symptoms of it getting worse."   Objective: Blood pressure 138/78, pulse 88, resp. rate 18, height 1.549 m (5\' 1" ), weight 230 lb (104.327 kg), SpO2 94 %. (on 3lpm of 02) Review of Systems  Cardiovascular: Positive for leg swelling.  Musculoskeletal: Positive for joint pain and neck pain. Negative for falls.    Physical Exam  Constitutional: She is oriented to person, place, and time. She appears well-developed and well-nourished.  Neck: Muscular tenderness present. Decreased range of motion present.  Cardiovascular: Normal rate, regular rhythm and normal heart sounds.   Pulses:      Dorsalis pedis pulses are 2+ on the right side, and 2+ on the left side.       Posterior tibial pulses are 2+ on the right side, and 2+ on the left side.  Respiratory: Effort normal and breath sounds normal.  With 3 liters of 02 via n/c  GI: Soft. Bowel sounds are normal.  Musculoskeletal: She exhibits edema and tenderness.       Left ankle: She exhibits swelling.       Cervical back: She exhibits pain.       Right upper arm: She exhibits tenderness.       Left upper arm: She exhibits tenderness.       Left forearm: She exhibits tenderness.       Right lower leg: She exhibits edema.  Pt c/o pain, tenderness, numbness and decreased movement across neck extending to elbow of right arm and all the way to fingers of left arm  Neurological: She is alert and oriented to person, place, and time.  Skin: Skin is warm, dry and intact.    Current Medications:   Current Outpatient Prescriptions  Medication Sig Dispense  Refill  . acetaminophen (TYLENOL) 325 MG tablet Take 325-650 mg by mouth every 6 (six) hours as needed for mild pain.     [name] ambrisentan (LETAIRIS) 5 MG tablet Take 1 tablet (5 mg total) by mouth daily. 30 tablet 5  . cyclobenzaprine (FLEXERIL) 5 MG tablet Take 5 mg by mouth 3 (three) times daily as needed for muscle spasms.    . diazepam (VALIUM) 5 MG tablet Take 1 tablet (5 mg total) by mouth every 8 (eight) hours as needed for muscle spasms (and/or neck pain). 30 tablet 0  . gabapentin (NEURONTIN) 100 MG capsule Take 1 capsule (100 mg total) by mouth 3 (three) times daily. 90 capsule 3  . glipiZIDE (GLUCOTROL) 5 MG tablet Take 0.5 tablets (2.5 mg total) by mouth daily before breakfast. 60 tablet 3  . insulin detemir (LEVEMIR) 100 UNIT/ML injection Inject 0.4 mLs (40 Units total) into the skin at bedtime. 10 mL 5  . meclizine (ANTIVERT) 25 MG tablet Take 25 mg by mouth daily as needed for dizziness.    . Menthol, Topical Analgesic, (BIOFREEZE EX) Apply 1 application topically as needed (for pain).     . naproxen (EC NAPROSYN) 500 MG EC tablet Take 500 mg by mouth 2 (two) times daily as needed (for pain).    [name]  ondansetron (ZOFRAN) 8 MG tablet Take 1 tablet (8 mg total) by mouth every 8 (eight) hours as needed for nausea or vomiting. 30 tablet 2  . oxyCODONE-acetaminophen (PERCOCET/ROXICET) 5-325 MG tablet Take 1 tablet by mouth every 8 (eight) hours as needed for severe pain. 30 tablet 0  . polyethylene glycol (MIRALAX / GLYCOLAX) packet Take 17 g by mouth daily as needed for moderate constipation.    . potassium chloride SA (KLOR-CON M20) 20 MEQ tablet Take 1 tablet (20 mEq total) by mouth 2 (two) times daily. 60 tablet 6  . promethazine (PHENERGAN) 25 MG tablet Take 25 mg by mouth every 4 (four) hours as needed for nausea or vomiting.     . torsemide (DEMADEX) 20 MG tablet Take 1 tablet (20 mg total) by mouth 2 (two) times daily. (Patient taking differently: Take 20 mg by mouth daily. ) 60 tablet 6   . verapamil (CALAN-SR) 240 MG CR tablet Take 240 mg by mouth daily.     No current facility-administered medications for this visit.    Functional Status:   In your present state of health, do you have any difficulty performing the following activities: 05/01/2015 04/12/2015  Hearing? - N  Vision? - Y  Difficulty concentrating or making decisions? - N  Walking or climbing stairs? - Y  Dressing or bathing? - Y  Doing errands, shopping? - Y  Preparing Food and eating ? N N  Using the Toilet? Y N  In the past six months, have you accidently leaked urine? Y Y  Do you have problems with loss of bowel control? N N  Managing your Medications? Y Y  Managing your Finances? [name]  Housekeeping or managing your Housekeeping? [name]    Fall/Depression Screening:    PHQ 2/9 Scores 05/01/2015 04/12/2015 06/29/2014 10/06/2013 07/09/2012 04/28/2012  PHQ - 2 Score 0 0 0 0 0 0   Fall Risk  05/01/2015 04/12/2015 06/29/2014 07/09/2012  Falls in the past year? No No No No  Risk for fall due to : Impaired mobility;Impaired balance/gait;Medication side effect;Impaired vision Impaired mobility;Impaired balance/gait - -    Assessment:  See physical assessment as above.   Alert oriented pleasant elderly lady in obvious pain. Grimace and guarding to left arm and neck. Pt stating this pain and numbness was her biggest problem causing her to have severe limitations with ADLs. Pt requesting someone to assist her with ADLs to take the burden off her daughters who are currently working and taking turns caring for pt. Daughter [name] managing pt medications. Pill organizer given to daughter to aide in medication management.   RNCM called Md office to inquire about pt's referral to neurology related to pt's pain. Referral done but appt was not going to be until December. RNCM explained to referral coordinator pt in severe pain and requested a sooner appt. Referral coordinator requested a cancellation appt and was able to get pt in  05/02/15.   RNCM taught pt about HF zones and educated pt to weigh daily. Pt wanting to know more about HF but related to pain was unable to focus on teaching. Pt checking sugars and was open to another RNCM visit to engage in more teaching.  Pt wanting educational information on advanced directives, would like assistance in filling out paper work when is pain is more manageable.     Plan: Plan RNCM will send communication order to CSW related to pt requesting PCS. RNCM will call pt related to transition of care  program next week.      Magdiel Bartles RN, BSN  [facility] Care Management [phone])   THN CM Care Plan Problem One        Most Recent Value   Care Plan Problem One  Pt recently d/c'd from hospital and SNF facility with multiple comorbidiities pt states she has a knowlege deficit of HF   Role Documenting the Problem One  Care Management Coordinator   Care Plan for Problem One  Active   THN Long Term Goal (31-90 days)  Pt will not be readmitted to acute setting in the next 90 days   THN Long Term Goal Start Date  04/25/15   Interventions for Problem One Long Term Goal  Transition of care calls started. RNCM called Nursoing home related to [facility] had not been in touch w pt post d/c.    THN CM Short Term Goal #1 (0-30 days)  Pt will see neurology as referred for pain in the next 30 days.   THN CM Short Term Goal #1 Start Date  05/01/15   Interventions for Short Term Goal #1  RNCM called MD office related to pt had not heard from neurology office to assist in scheduling appt.   THN CM Short Term Goal #2 (0-30 days)  Pt will be able to name s/s of worsening HF according to the HF zones in the next 30 days.   THN CM Short Term Goal #2 Start Date  05/01/15   Interventions for Short Term Goal #2  RNCM reviewed s/s of HF with [facility] calendar teaching on zones. Encouraged pt to weigh daily and record.

## 2015-05-01 NOTE — Telephone Encounter (Signed)
[facility] has requested orders for physical therapy , 3 times a week for 2 week and twice a week for three weeks.

## 2015-05-02 DIAGNOSIS — E1122 Type 2 diabetes mellitus with diabetic chronic kidney disease: Secondary | ICD-10-CM | POA: Diagnosis not present

## 2015-05-02 DIAGNOSIS — I132 Hypertensive heart and chronic kidney disease with heart failure and with stage 5 chronic kidney disease, or end stage renal disease: Secondary | ICD-10-CM | POA: Diagnosis not present

## 2015-05-02 DIAGNOSIS — N186 End stage renal disease: Secondary | ICD-10-CM | POA: Diagnosis not present

## 2015-05-02 DIAGNOSIS — R569 Unspecified convulsions: Secondary | ICD-10-CM | POA: Diagnosis not present

## 2015-05-02 DIAGNOSIS — M5010 Cervical disc disorder with radiculopathy, unspecified cervical region: Secondary | ICD-10-CM | POA: Diagnosis not present

## 2015-05-02 DIAGNOSIS — I69998 Other sequelae following unspecified cerebrovascular disease: Secondary | ICD-10-CM | POA: Diagnosis not present

## 2015-05-02 DIAGNOSIS — M79602 Pain in left arm: Secondary | ICD-10-CM | POA: Diagnosis not present

## 2015-05-02 DIAGNOSIS — J449 Chronic obstructive pulmonary disease, unspecified: Secondary | ICD-10-CM | POA: Diagnosis not present

## 2015-05-02 DIAGNOSIS — R42 Dizziness and giddiness: Secondary | ICD-10-CM | POA: Diagnosis not present

## 2015-05-02 DIAGNOSIS — I5030 Unspecified diastolic (congestive) heart failure: Secondary | ICD-10-CM | POA: Diagnosis not present

## 2015-05-02 DIAGNOSIS — G4733 Obstructive sleep apnea (adult) (pediatric): Secondary | ICD-10-CM | POA: Diagnosis not present

## 2015-05-02 DIAGNOSIS — G4489 Other headache syndrome: Secondary | ICD-10-CM | POA: Diagnosis not present

## 2015-05-02 DIAGNOSIS — M1990 Unspecified osteoarthritis, unspecified site: Secondary | ICD-10-CM | POA: Diagnosis not present

## 2015-05-02 DIAGNOSIS — J9611 Chronic respiratory failure with hypoxia: Secondary | ICD-10-CM | POA: Diagnosis not present

## 2015-05-02 NOTE — Telephone Encounter (Signed)
Spoke with Aldrin at [facility] and gave verbal orders for Physical therapy

## 2015-05-02 NOTE — Telephone Encounter (Signed)
Please advise 

## 2015-05-02 NOTE — Telephone Encounter (Signed)
Fine to give verbal order for this.

## 2015-05-03 ENCOUNTER — Ambulatory Visit: Payer: Self-pay | Admitting: *Deleted

## 2015-05-03 DIAGNOSIS — M5010 Cervical disc disorder with radiculopathy, unspecified cervical region: Secondary | ICD-10-CM | POA: Diagnosis not present

## 2015-05-03 DIAGNOSIS — J9611 Chronic respiratory failure with hypoxia: Secondary | ICD-10-CM | POA: Diagnosis not present

## 2015-05-03 DIAGNOSIS — N186 End stage renal disease: Secondary | ICD-10-CM | POA: Diagnosis not present

## 2015-05-03 DIAGNOSIS — I132 Hypertensive heart and chronic kidney disease with heart failure and with stage 5 chronic kidney disease, or end stage renal disease: Secondary | ICD-10-CM | POA: Diagnosis not present

## 2015-05-03 DIAGNOSIS — I5030 Unspecified diastolic (congestive) heart failure: Secondary | ICD-10-CM | POA: Diagnosis not present

## 2015-05-03 DIAGNOSIS — E1122 Type 2 diabetes mellitus with diabetic chronic kidney disease: Secondary | ICD-10-CM | POA: Diagnosis not present

## 2015-05-03 DIAGNOSIS — J449 Chronic obstructive pulmonary disease, unspecified: Secondary | ICD-10-CM | POA: Diagnosis not present

## 2015-05-03 DIAGNOSIS — G4733 Obstructive sleep apnea (adult) (pediatric): Secondary | ICD-10-CM | POA: Diagnosis not present

## 2015-05-03 DIAGNOSIS — M1990 Unspecified osteoarthritis, unspecified site: Secondary | ICD-10-CM | POA: Diagnosis not present

## 2015-05-03 NOTE — Progress Notes (Signed)
This encounter was created in error - please disregard.

## 2015-05-03 NOTE — Patient Outreach (Signed)
[facility]) Care Management  05/03/2015  Debra Saunders 05/06/1946 [id number]   Request from [place], RN to assign SW, assigned [employer], [place].  Thanks, Ronnell Freshwater. [place], [place] Assistant Phone: [phone] Fax: [phone]

## 2015-05-03 NOTE — Patient Outreach (Signed)
[facility]) Care Management  05/03/2015  SONOMA THISSEN [birth year] [id number]   Erroneous encounter. Opened in error  Aarionna Germer RN, BSN  [facility] Care Management [phone])

## 2015-05-07 ENCOUNTER — Other Ambulatory Visit: Payer: Self-pay | Admitting: *Deleted

## 2015-05-07 DIAGNOSIS — M5010 Cervical disc disorder with radiculopathy, unspecified cervical region: Secondary | ICD-10-CM | POA: Diagnosis not present

## 2015-05-07 DIAGNOSIS — E1122 Type 2 diabetes mellitus with diabetic chronic kidney disease: Secondary | ICD-10-CM | POA: Diagnosis not present

## 2015-05-07 DIAGNOSIS — I132 Hypertensive heart and chronic kidney disease with heart failure and with stage 5 chronic kidney disease, or end stage renal disease: Secondary | ICD-10-CM | POA: Diagnosis not present

## 2015-05-07 DIAGNOSIS — I5030 Unspecified diastolic (congestive) heart failure: Secondary | ICD-10-CM | POA: Diagnosis not present

## 2015-05-07 DIAGNOSIS — J449 Chronic obstructive pulmonary disease, unspecified: Secondary | ICD-10-CM | POA: Diagnosis not present

## 2015-05-07 DIAGNOSIS — M1990 Unspecified osteoarthritis, unspecified site: Secondary | ICD-10-CM | POA: Diagnosis not present

## 2015-05-07 DIAGNOSIS — N186 End stage renal disease: Secondary | ICD-10-CM | POA: Diagnosis not present

## 2015-05-07 DIAGNOSIS — J9611 Chronic respiratory failure with hypoxia: Secondary | ICD-10-CM | POA: Diagnosis not present

## 2015-05-07 DIAGNOSIS — G4733 Obstructive sleep apnea (adult) (pediatric): Secondary | ICD-10-CM | POA: Diagnosis not present

## 2015-05-07 NOTE — Patient Outreach (Signed)
[place] [facility]) Care Management  05/07/2015  WHITLEE LEADINGHAM 01/26/1946 [id number]  Phone call to patient to schedule home visit to assist with completing her Advanced Directive.  Home visit scheduled for 05/14/15 at [place], [facility] [phone]

## 2015-05-08 ENCOUNTER — Other Ambulatory Visit: Payer: Self-pay | Admitting: *Deleted

## 2015-05-08 DIAGNOSIS — G4733 Obstructive sleep apnea (adult) (pediatric): Secondary | ICD-10-CM | POA: Diagnosis not present

## 2015-05-08 DIAGNOSIS — M5010 Cervical disc disorder with radiculopathy, unspecified cervical region: Secondary | ICD-10-CM | POA: Diagnosis not present

## 2015-05-08 DIAGNOSIS — I132 Hypertensive heart and chronic kidney disease with heart failure and with stage 5 chronic kidney disease, or end stage renal disease: Secondary | ICD-10-CM | POA: Diagnosis not present

## 2015-05-08 DIAGNOSIS — M1990 Unspecified osteoarthritis, unspecified site: Secondary | ICD-10-CM | POA: Diagnosis not present

## 2015-05-08 DIAGNOSIS — N186 End stage renal disease: Secondary | ICD-10-CM | POA: Diagnosis not present

## 2015-05-08 DIAGNOSIS — I5030 Unspecified diastolic (congestive) heart failure: Secondary | ICD-10-CM | POA: Diagnosis not present

## 2015-05-08 DIAGNOSIS — J449 Chronic obstructive pulmonary disease, unspecified: Secondary | ICD-10-CM | POA: Diagnosis not present

## 2015-05-08 DIAGNOSIS — J9611 Chronic respiratory failure with hypoxia: Secondary | ICD-10-CM | POA: Diagnosis not present

## 2015-05-08 DIAGNOSIS — E1122 Type 2 diabetes mellitus with diabetic chronic kidney disease: Secondary | ICD-10-CM | POA: Diagnosis not present

## 2015-05-08 NOTE — Patient Outreach (Signed)
RNCM made outreach call to pt as part of the transition of care program. This is outreach #3. RNCM asked pt about recent neurology visit. Pt stating she felt very discouraged because at the neurology visit the MD only increased her medication but did not tell her the root of her pain. Pt stated the neurologist told her if she started having seizures to come back and see him. Pt stated the neurologist mentioned sending her to a pain specialist or an orthopedic specialist but she has not heard from these referrals. RNCM talked with pt about her progress with PT. Pt stated she can tell she has had some slight improvement related to not having to take the flexeril with the oxycodone this week so far. Pt stated she continues to have numbness to left fingers, palm of hand and thumb. Pt stated her pain is worse when she first wakes and as she moves it lessens slightly. Pt stating she is down to 2 oxycodone per day with the increased gabapentin. Pt stated her blood sugars have been mostly wnl, ranging from 130 -160. Pt stated she has started weighing daily as discussed at previous RNCM visit and has not seen any weight gain. Pt denies increased SOB or increased swelling to legs, feet, hands or abdomen. Pt sates left foot a little less swollen than last week. Pt stated she asked her PT about acupuncture related to this was helpful in relieving her pain in the past. Pt is unsure if her [employer] benefits cover this service. RNCM discussed with pt a plan to call pt next week and visit pt in 2 weeks.   Plan: RNCM will call pt next week as part of transition of care.  RNCM will send a message to pcp related to referrals made by neurologist.  [facility] will contact [employer] about coverage for alternative therapy such as accupuncture.  Rutherford Limerick RN, BSN  [facility] Care Management [phone])

## 2015-05-09 DIAGNOSIS — M1990 Unspecified osteoarthritis, unspecified site: Secondary | ICD-10-CM | POA: Diagnosis not present

## 2015-05-09 DIAGNOSIS — G4733 Obstructive sleep apnea (adult) (pediatric): Secondary | ICD-10-CM | POA: Diagnosis not present

## 2015-05-09 DIAGNOSIS — I132 Hypertensive heart and chronic kidney disease with heart failure and with stage 5 chronic kidney disease, or end stage renal disease: Secondary | ICD-10-CM | POA: Diagnosis not present

## 2015-05-09 DIAGNOSIS — N186 End stage renal disease: Secondary | ICD-10-CM | POA: Diagnosis not present

## 2015-05-09 DIAGNOSIS — M5010 Cervical disc disorder with radiculopathy, unspecified cervical region: Secondary | ICD-10-CM | POA: Diagnosis not present

## 2015-05-09 DIAGNOSIS — J9611 Chronic respiratory failure with hypoxia: Secondary | ICD-10-CM | POA: Diagnosis not present

## 2015-05-09 DIAGNOSIS — J449 Chronic obstructive pulmonary disease, unspecified: Secondary | ICD-10-CM | POA: Diagnosis not present

## 2015-05-09 DIAGNOSIS — I5030 Unspecified diastolic (congestive) heart failure: Secondary | ICD-10-CM | POA: Diagnosis not present

## 2015-05-09 DIAGNOSIS — E1122 Type 2 diabetes mellitus with diabetic chronic kidney disease: Secondary | ICD-10-CM | POA: Diagnosis not present

## 2015-05-10 ENCOUNTER — Telehealth: Payer: Self-pay | Admitting: Internal Medicine

## 2015-05-10 NOTE — Telephone Encounter (Signed)
Per Margarita Grizzle needs OT for twice a week for 8 weeks.

## 2015-05-10 NOTE — Telephone Encounter (Signed)
Debra Saunders [id number] called from [facility] regarding needing a verbal order for OT . Thank You!

## 2015-05-10 NOTE — Telephone Encounter (Signed)
Fine to give a verbal order for this.

## 2015-05-10 NOTE — Telephone Encounter (Signed)
Pt is requiring more help with her ADLs and granddaughter need a little help. They are requesting 2 weeks.

## 2015-05-11 DIAGNOSIS — G4733 Obstructive sleep apnea (adult) (pediatric): Secondary | ICD-10-CM | POA: Diagnosis not present

## 2015-05-11 DIAGNOSIS — N186 End stage renal disease: Secondary | ICD-10-CM | POA: Diagnosis not present

## 2015-05-11 DIAGNOSIS — E1122 Type 2 diabetes mellitus with diabetic chronic kidney disease: Secondary | ICD-10-CM | POA: Diagnosis not present

## 2015-05-11 DIAGNOSIS — J449 Chronic obstructive pulmonary disease, unspecified: Secondary | ICD-10-CM | POA: Diagnosis not present

## 2015-05-11 DIAGNOSIS — M5010 Cervical disc disorder with radiculopathy, unspecified cervical region: Secondary | ICD-10-CM | POA: Diagnosis not present

## 2015-05-11 DIAGNOSIS — I132 Hypertensive heart and chronic kidney disease with heart failure and with stage 5 chronic kidney disease, or end stage renal disease: Secondary | ICD-10-CM | POA: Diagnosis not present

## 2015-05-11 DIAGNOSIS — J9611 Chronic respiratory failure with hypoxia: Secondary | ICD-10-CM | POA: Diagnosis not present

## 2015-05-11 DIAGNOSIS — I5030 Unspecified diastolic (congestive) heart failure: Secondary | ICD-10-CM | POA: Diagnosis not present

## 2015-05-11 DIAGNOSIS — M1990 Unspecified osteoarthritis, unspecified site: Secondary | ICD-10-CM | POA: Diagnosis not present

## 2015-05-14 ENCOUNTER — Other Ambulatory Visit: Payer: Self-pay | Admitting: *Deleted

## 2015-05-14 DIAGNOSIS — G4733 Obstructive sleep apnea (adult) (pediatric): Secondary | ICD-10-CM | POA: Diagnosis not present

## 2015-05-14 DIAGNOSIS — N186 End stage renal disease: Secondary | ICD-10-CM | POA: Diagnosis not present

## 2015-05-14 DIAGNOSIS — M5010 Cervical disc disorder with radiculopathy, unspecified cervical region: Secondary | ICD-10-CM | POA: Diagnosis not present

## 2015-05-14 DIAGNOSIS — E1122 Type 2 diabetes mellitus with diabetic chronic kidney disease: Secondary | ICD-10-CM | POA: Diagnosis not present

## 2015-05-14 DIAGNOSIS — I132 Hypertensive heart and chronic kidney disease with heart failure and with stage 5 chronic kidney disease, or end stage renal disease: Secondary | ICD-10-CM | POA: Diagnosis not present

## 2015-05-14 DIAGNOSIS — M1990 Unspecified osteoarthritis, unspecified site: Secondary | ICD-10-CM | POA: Diagnosis not present

## 2015-05-14 DIAGNOSIS — J449 Chronic obstructive pulmonary disease, unspecified: Secondary | ICD-10-CM | POA: Diagnosis not present

## 2015-05-14 DIAGNOSIS — J9611 Chronic respiratory failure with hypoxia: Secondary | ICD-10-CM | POA: Diagnosis not present

## 2015-05-14 DIAGNOSIS — I5030 Unspecified diastolic (congestive) heart failure: Secondary | ICD-10-CM | POA: Diagnosis not present

## 2015-05-14 NOTE — Patient Outreach (Signed)
[facility]) Care Management  [facility] Social Work  05/14/2015  LADYE MACNAUGHTON [birth year] [id number]  Subjective:    Objective:   Current Medications:  Current Outpatient Prescriptions  Medication Sig Dispense Refill  . acetaminophen (TYLENOL) 325 MG tablet Take 325-650 mg by mouth every 6 (six) hours as needed for mild pain.     [name] ambrisentan (LETAIRIS) 5 MG tablet Take 1 tablet (5 mg total) by mouth daily. 30 tablet 5  . cyclobenzaprine (FLEXERIL) 5 MG tablet Take 5 mg by mouth 3 (three) times daily as needed for muscle spasms.    . diazepam (VALIUM) 5 MG tablet Take 1 tablet (5 mg total) by mouth every 8 (eight) hours as needed for muscle spasms (and/or neck pain). 30 tablet 0  . gabapentin (NEURONTIN) 100 MG capsule Take 1 capsule (100 mg total) by mouth 3 (three) times daily. (Patient taking differently: Take 200 mg by mouth 3 (three) times daily. ) 90 capsule 3  . glipiZIDE (GLUCOTROL) 5 MG tablet Take 0.5 tablets (2.5 mg total) by mouth daily before breakfast. 60 tablet 3  . insulin detemir (LEVEMIR) 100 UNIT/ML injection Inject 0.4 mLs (40 Units total) into the skin at bedtime. 10 mL 5  . meclizine (ANTIVERT) 25 MG tablet Take 25 mg by mouth daily as needed for dizziness.    . Menthol, Topical Analgesic, (BIOFREEZE EX) Apply 1 application topically as needed (for pain).     . naproxen (EC NAPROSYN) 500 MG EC tablet Take 500 mg by mouth 2 (two) times daily as needed (for pain).    . ondansetron (ZOFRAN) 8 MG tablet Take 1 tablet (8 mg total) by mouth every 8 (eight) hours as needed for nausea or vomiting. 30 tablet 2  . oxyCODONE-acetaminophen (PERCOCET/ROXICET) 5-325 MG tablet Take 1 tablet by mouth every 8 (eight) hours as needed for severe pain. 30 tablet 0  . polyethylene glycol (MIRALAX / GLYCOLAX) packet Take 17 g by mouth daily as needed for moderate constipation.    . potassium chloride SA (KLOR-CON M20) 20 MEQ tablet Take 1 tablet (20 mEq total) by mouth 2  (two) times daily. 60 tablet 6  . promethazine (PHENERGAN) 25 MG tablet Take 25 mg by mouth every 4 (four) hours as needed for nausea or vomiting.     . torsemide (DEMADEX) 20 MG tablet Take 1 tablet (20 mg total) by mouth 2 (two) times daily. (Patient taking differently: Take 20 mg by mouth daily. ) 60 tablet 6  . verapamil (CALAN-SR) 240 MG CR tablet Take 240 mg by mouth daily.     No current facility-administered medications for this visit.    Functional Status:  In your present state of health, do you have any difficulty performing the following activities: 05/01/2015 04/12/2015  Hearing? - N  Vision? - Y  Difficulty concentrating or making decisions? - N  Walking or climbing stairs? - Y  Dressing or bathing? - Y  Doing errands, shopping? - Y  Preparing Food and eating ? N N  Using the Toilet? Y N  In the past six months, have you accidently leaked urine? Y Y  Do you have problems with loss of bowel control? N N  Managing your Medications? Y Y  Managing your Finances? [name]  Housekeeping or managing your Housekeeping? [name]    Fall/Depression Screening:  PHQ 2/9 Scores 05/01/2015 04/12/2015 06/29/2014 10/06/2013 07/09/2012 04/28/2012  PHQ - 2 Score 0 0 0 0 0 0    Assessment:  Home visit to patient's home to assist patient with her Advanced Directive and Living Will. Advanced Directive and Living Will completed with patient and daughter.  Patient agrees to get form notarized.   Referral to the [facility] also made to assist patient with getting a wheelchair ramp built as well as grab bars installed in her bathroom to avoid falls.  Patient will received referral paperwork in the mail to complete and return .  Patient currently receiving home health Physical Therapy  through [facility], however is not receiving Occupational Therapy as recommended.  Patient also requesting a bath aid.  Phone call to Mickel Baas case manager with [facility] [phone] who states that she will follow up with  Occupational Therapist regarding recommendation.  She will also request the order for a bath aid, however due to staffing may not be able to provide this service immediately.  Plan:  This Education officer, museum will follow up with patient in 2 weeks in regards to referral to [place]             Referrals to be provided to patient for private duty bath aids             Patient's Advanced Directive to be scanned in to patient's chart after documents notarized    [place] Problem One        Most Recent Value   Care Plan Problem One  patient needs wheelchair ramp and  grab bars   Role Documenting the Problem One  Clinical Social Worker   Care Plan for Problem One  Active   [facility] Long Term Goal (31-90 days)  patient will jave wheelchair ramp and grab bars installed within the next 90 days   [place] Term Goal Start Date  05/14/15   Interventions for Problem One Long Term Goal  referral made to the [place] for assistance with the installation of wheelchair ramp and grab bars   [facility] CM Short Term Goal #1 (0-30 days)  patient will verbalize completion of  referral paperwork from the [place] for wheelchair ramp and grab bars   [facility] CM Short Term Goal #1 Start Date  05/14/15   [facility] CM Short Term Goal #1 Met Date  04/23/15   Interventions for Short Term Goal #1  encouraged completiion of paperwork  as soon as possible once received   [facility] CM Short Term Goal #2 (0-30 days)  patient will actively participate in physical and occupational therapy for the next 30 days   [facility] CM Short Term Goal #2 Start Date  04/12/15   [facility] CM Short Term Goal #2 Met Date  04/23/15   Interventions for Short Term Goal #2  social worker encouraged continued active participation while in treatment     [place], [place] Management [phone]

## 2015-05-15 ENCOUNTER — Other Ambulatory Visit: Payer: Self-pay | Admitting: Internal Medicine

## 2015-05-15 ENCOUNTER — Other Ambulatory Visit: Payer: Self-pay | Admitting: *Deleted

## 2015-05-15 DIAGNOSIS — I5030 Unspecified diastolic (congestive) heart failure: Secondary | ICD-10-CM | POA: Diagnosis not present

## 2015-05-15 DIAGNOSIS — G4733 Obstructive sleep apnea (adult) (pediatric): Secondary | ICD-10-CM | POA: Diagnosis not present

## 2015-05-15 DIAGNOSIS — I132 Hypertensive heart and chronic kidney disease with heart failure and with stage 5 chronic kidney disease, or end stage renal disease: Secondary | ICD-10-CM | POA: Diagnosis not present

## 2015-05-15 DIAGNOSIS — N186 End stage renal disease: Secondary | ICD-10-CM | POA: Diagnosis not present

## 2015-05-15 DIAGNOSIS — M5010 Cervical disc disorder with radiculopathy, unspecified cervical region: Secondary | ICD-10-CM | POA: Diagnosis not present

## 2015-05-15 DIAGNOSIS — M1990 Unspecified osteoarthritis, unspecified site: Secondary | ICD-10-CM | POA: Diagnosis not present

## 2015-05-15 DIAGNOSIS — J9611 Chronic respiratory failure with hypoxia: Secondary | ICD-10-CM | POA: Diagnosis not present

## 2015-05-15 DIAGNOSIS — J449 Chronic obstructive pulmonary disease, unspecified: Secondary | ICD-10-CM | POA: Diagnosis not present

## 2015-05-15 DIAGNOSIS — E1122 Type 2 diabetes mellitus with diabetic chronic kidney disease: Secondary | ICD-10-CM | POA: Diagnosis not present

## 2015-05-15 NOTE — Patient Outreach (Addendum)
RNCM called pt as part of the transition of care program. Made pt aware of upcoming appt with orthopedic provider Hinda Lenis at [facility] 12/7 @ 8:15am. Pt continues to complain of neck pain and decreased range of motion in left arm. Pt did report there have been some improvements in the pain since she started the extra Neurontin ordered by the neurologist. Pt stated she is having to take less pain medication but because of the extra drowsiness of the Neurontin she still has decreased quality of life and would like to know the cause of her pain and decreased ability to use her left arm. Pt stated she had started weighing daily and has not had a significant weight gain. She denies increased SOB, coughing, poor appetite or inability to lie flat to rest. RNCM encouraged pt to go to orthopedic consult to see if she could figure out the cause of her symptoms. She stated she has transportation. Pt reports having all medications and taking them as ordered. RNCM made pt aware she would be seeing her next week on Tuesday. As previously scheduled.    Plan: RNCM will continue to f/u with pt as part of TOC on Tuesday Nov 29.  Rutherford Limerick RN, BSN  [facility] Care Management [phone])

## 2015-05-16 DIAGNOSIS — N186 End stage renal disease: Secondary | ICD-10-CM | POA: Diagnosis not present

## 2015-05-16 DIAGNOSIS — J9611 Chronic respiratory failure with hypoxia: Secondary | ICD-10-CM | POA: Diagnosis not present

## 2015-05-16 DIAGNOSIS — M5010 Cervical disc disorder with radiculopathy, unspecified cervical region: Secondary | ICD-10-CM | POA: Diagnosis not present

## 2015-05-16 DIAGNOSIS — J449 Chronic obstructive pulmonary disease, unspecified: Secondary | ICD-10-CM | POA: Diagnosis not present

## 2015-05-16 DIAGNOSIS — M1990 Unspecified osteoarthritis, unspecified site: Secondary | ICD-10-CM | POA: Diagnosis not present

## 2015-05-16 DIAGNOSIS — G4733 Obstructive sleep apnea (adult) (pediatric): Secondary | ICD-10-CM | POA: Diagnosis not present

## 2015-05-16 DIAGNOSIS — I5030 Unspecified diastolic (congestive) heart failure: Secondary | ICD-10-CM | POA: Diagnosis not present

## 2015-05-16 DIAGNOSIS — E1122 Type 2 diabetes mellitus with diabetic chronic kidney disease: Secondary | ICD-10-CM | POA: Diagnosis not present

## 2015-05-16 DIAGNOSIS — I132 Hypertensive heart and chronic kidney disease with heart failure and with stage 5 chronic kidney disease, or end stage renal disease: Secondary | ICD-10-CM | POA: Diagnosis not present

## 2015-05-17 DIAGNOSIS — I509 Heart failure, unspecified: Secondary | ICD-10-CM | POA: Diagnosis not present

## 2015-05-18 DIAGNOSIS — G4733 Obstructive sleep apnea (adult) (pediatric): Secondary | ICD-10-CM | POA: Diagnosis not present

## 2015-05-18 DIAGNOSIS — M5010 Cervical disc disorder with radiculopathy, unspecified cervical region: Secondary | ICD-10-CM | POA: Diagnosis not present

## 2015-05-18 DIAGNOSIS — M1990 Unspecified osteoarthritis, unspecified site: Secondary | ICD-10-CM | POA: Diagnosis not present

## 2015-05-18 DIAGNOSIS — J449 Chronic obstructive pulmonary disease, unspecified: Secondary | ICD-10-CM | POA: Diagnosis not present

## 2015-05-18 DIAGNOSIS — J9611 Chronic respiratory failure with hypoxia: Secondary | ICD-10-CM | POA: Diagnosis not present

## 2015-05-18 DIAGNOSIS — E1122 Type 2 diabetes mellitus with diabetic chronic kidney disease: Secondary | ICD-10-CM | POA: Diagnosis not present

## 2015-05-18 DIAGNOSIS — I5030 Unspecified diastolic (congestive) heart failure: Secondary | ICD-10-CM | POA: Diagnosis not present

## 2015-05-18 DIAGNOSIS — N186 End stage renal disease: Secondary | ICD-10-CM | POA: Diagnosis not present

## 2015-05-18 DIAGNOSIS — I132 Hypertensive heart and chronic kidney disease with heart failure and with stage 5 chronic kidney disease, or end stage renal disease: Secondary | ICD-10-CM | POA: Diagnosis not present

## 2015-05-19 DIAGNOSIS — J449 Chronic obstructive pulmonary disease, unspecified: Secondary | ICD-10-CM | POA: Diagnosis not present

## 2015-05-19 DIAGNOSIS — J9611 Chronic respiratory failure with hypoxia: Secondary | ICD-10-CM | POA: Diagnosis not present

## 2015-05-19 DIAGNOSIS — I132 Hypertensive heart and chronic kidney disease with heart failure and with stage 5 chronic kidney disease, or end stage renal disease: Secondary | ICD-10-CM | POA: Diagnosis not present

## 2015-05-19 DIAGNOSIS — N186 End stage renal disease: Secondary | ICD-10-CM | POA: Diagnosis not present

## 2015-05-19 DIAGNOSIS — G4733 Obstructive sleep apnea (adult) (pediatric): Secondary | ICD-10-CM | POA: Diagnosis not present

## 2015-05-19 DIAGNOSIS — M5010 Cervical disc disorder with radiculopathy, unspecified cervical region: Secondary | ICD-10-CM | POA: Diagnosis not present

## 2015-05-19 DIAGNOSIS — I5030 Unspecified diastolic (congestive) heart failure: Secondary | ICD-10-CM | POA: Diagnosis not present

## 2015-05-19 DIAGNOSIS — E1122 Type 2 diabetes mellitus with diabetic chronic kidney disease: Secondary | ICD-10-CM | POA: Diagnosis not present

## 2015-05-19 DIAGNOSIS — M1990 Unspecified osteoarthritis, unspecified site: Secondary | ICD-10-CM | POA: Diagnosis not present

## 2015-05-22 ENCOUNTER — Ambulatory Visit: Payer: Commercial Managed Care - HMO | Admitting: *Deleted

## 2015-05-22 ENCOUNTER — Telehealth: Payer: Self-pay | Admitting: Internal Medicine

## 2015-05-22 ENCOUNTER — Other Ambulatory Visit: Payer: Self-pay | Admitting: *Deleted

## 2015-05-22 DIAGNOSIS — J9611 Chronic respiratory failure with hypoxia: Secondary | ICD-10-CM | POA: Diagnosis not present

## 2015-05-22 DIAGNOSIS — E1122 Type 2 diabetes mellitus with diabetic chronic kidney disease: Secondary | ICD-10-CM | POA: Diagnosis not present

## 2015-05-22 DIAGNOSIS — M5010 Cervical disc disorder with radiculopathy, unspecified cervical region: Secondary | ICD-10-CM | POA: Diagnosis not present

## 2015-05-22 DIAGNOSIS — G4733 Obstructive sleep apnea (adult) (pediatric): Secondary | ICD-10-CM | POA: Diagnosis not present

## 2015-05-22 DIAGNOSIS — I132 Hypertensive heart and chronic kidney disease with heart failure and with stage 5 chronic kidney disease, or end stage renal disease: Secondary | ICD-10-CM | POA: Diagnosis not present

## 2015-05-22 DIAGNOSIS — M1990 Unspecified osteoarthritis, unspecified site: Secondary | ICD-10-CM | POA: Diagnosis not present

## 2015-05-22 DIAGNOSIS — J449 Chronic obstructive pulmonary disease, unspecified: Secondary | ICD-10-CM | POA: Diagnosis not present

## 2015-05-22 DIAGNOSIS — I5030 Unspecified diastolic (congestive) heart failure: Secondary | ICD-10-CM | POA: Diagnosis not present

## 2015-05-22 DIAGNOSIS — N186 End stage renal disease: Secondary | ICD-10-CM | POA: Diagnosis not present

## 2015-05-22 NOTE — Telephone Encounter (Signed)
Fine to check urinalysis and culture. By SCD, do you mean compression pumps for her legs? Fine for verbal for home health aid.

## 2015-05-22 NOTE — Patient Outreach (Signed)
RNCM contacted pt as part of the transition of care program and also to reschedule [facility] appointment. Pt stated she was slowly doing better and was getting some movement back in her Left arm. Pt stated her sugars were wnl for the holidays. Pt stated her weight had remained steady and was continuing to watch her salt intake. Pt denied increased SOB, decreased appetite, inability to rest, or increased edema. Pt stated OT had assisted her in getting in the bathroom for a shower last week and she was feeling more comfortable in doing self care. Pt stated PT was on their way this morning. Pt appointment scheduled for 12/8.   Plan: RNCM will see pt next week as continued part of transition of care program.   Merlene Morse Maxden Naji RN, BSN  [facility] Care Management [phone])

## 2015-05-22 NOTE — Telephone Encounter (Signed)
Debra Saunders [id number] called from [facility] homehealth regarding needing a verbal order for a Urinalysis and Urine Culture. Pt wants to know if she should start using her SCD? Pt wants to know if she can get a home health aid twice a week verbal order if possible? Thank You!

## 2015-05-23 ENCOUNTER — Other Ambulatory Visit
Admission: RE | Admit: 2015-05-23 | Discharge: 2015-05-23 | Disposition: A | Discharge status: Home / Self Care | Payer: Commercial Managed Care - HMO | Source: Ambulatory Visit | Attending: Internal Medicine | Admitting: Internal Medicine

## 2015-05-23 DIAGNOSIS — I5030 Unspecified diastolic (congestive) heart failure: Secondary | ICD-10-CM | POA: Diagnosis not present

## 2015-05-23 DIAGNOSIS — M1990 Unspecified osteoarthritis, unspecified site: Secondary | ICD-10-CM | POA: Diagnosis not present

## 2015-05-23 DIAGNOSIS — G4733 Obstructive sleep apnea (adult) (pediatric): Secondary | ICD-10-CM | POA: Diagnosis not present

## 2015-05-23 DIAGNOSIS — N186 End stage renal disease: Secondary | ICD-10-CM | POA: Diagnosis not present

## 2015-05-23 DIAGNOSIS — J449 Chronic obstructive pulmonary disease, unspecified: Secondary | ICD-10-CM | POA: Diagnosis not present

## 2015-05-23 DIAGNOSIS — J9611 Chronic respiratory failure with hypoxia: Secondary | ICD-10-CM | POA: Diagnosis not present

## 2015-05-23 DIAGNOSIS — R300 Dysuria: Secondary | ICD-10-CM | POA: Insufficient documentation

## 2015-05-23 DIAGNOSIS — I132 Hypertensive heart and chronic kidney disease with heart failure and with stage 5 chronic kidney disease, or end stage renal disease: Secondary | ICD-10-CM | POA: Diagnosis not present

## 2015-05-23 DIAGNOSIS — E1122 Type 2 diabetes mellitus with diabetic chronic kidney disease: Secondary | ICD-10-CM | POA: Diagnosis not present

## 2015-05-23 DIAGNOSIS — M5010 Cervical disc disorder with radiculopathy, unspecified cervical region: Secondary | ICD-10-CM | POA: Diagnosis not present

## 2015-05-23 LAB — URINALYSIS COMPLETE WITH MICROSCOPIC (ARMC ONLY)
Bacteria, UA: NONE SEEN
Bilirubin Urine: NEGATIVE
Glucose, UA: 150 mg/dL — AB
Hgb urine dipstick: NEGATIVE
Ketones, ur: NEGATIVE mg/dL
Leukocytes, UA: NEGATIVE
Nitrite: NEGATIVE
Protein, ur: NEGATIVE mg/dL
RBC / HPF: NONE SEEN RBC/hpf (ref 0–5)
Specific Gravity, Urine: 1.024 (ref 1.005–1.030)
Squamous Epithelial / LPF: NONE SEEN
pH: 5 (ref 5.0–8.0)

## 2015-05-23 NOTE — Telephone Encounter (Signed)
Yes, SCD meaning compression holes. I have given her the approval

## 2015-05-24 DIAGNOSIS — I132 Hypertensive heart and chronic kidney disease with heart failure and with stage 5 chronic kidney disease, or end stage renal disease: Secondary | ICD-10-CM | POA: Diagnosis not present

## 2015-05-24 DIAGNOSIS — M5010 Cervical disc disorder with radiculopathy, unspecified cervical region: Secondary | ICD-10-CM | POA: Diagnosis not present

## 2015-05-24 DIAGNOSIS — J9611 Chronic respiratory failure with hypoxia: Secondary | ICD-10-CM | POA: Diagnosis not present

## 2015-05-24 DIAGNOSIS — E1122 Type 2 diabetes mellitus with diabetic chronic kidney disease: Secondary | ICD-10-CM | POA: Diagnosis not present

## 2015-05-24 DIAGNOSIS — I5030 Unspecified diastolic (congestive) heart failure: Secondary | ICD-10-CM | POA: Diagnosis not present

## 2015-05-24 DIAGNOSIS — M1990 Unspecified osteoarthritis, unspecified site: Secondary | ICD-10-CM | POA: Diagnosis not present

## 2015-05-24 DIAGNOSIS — J449 Chronic obstructive pulmonary disease, unspecified: Secondary | ICD-10-CM | POA: Diagnosis not present

## 2015-05-24 DIAGNOSIS — N186 End stage renal disease: Secondary | ICD-10-CM | POA: Diagnosis not present

## 2015-05-24 DIAGNOSIS — G4733 Obstructive sleep apnea (adult) (pediatric): Secondary | ICD-10-CM | POA: Diagnosis not present

## 2015-05-25 DIAGNOSIS — I5030 Unspecified diastolic (congestive) heart failure: Secondary | ICD-10-CM | POA: Diagnosis not present

## 2015-05-25 DIAGNOSIS — I132 Hypertensive heart and chronic kidney disease with heart failure and with stage 5 chronic kidney disease, or end stage renal disease: Secondary | ICD-10-CM | POA: Diagnosis not present

## 2015-05-25 DIAGNOSIS — E1122 Type 2 diabetes mellitus with diabetic chronic kidney disease: Secondary | ICD-10-CM | POA: Diagnosis not present

## 2015-05-25 DIAGNOSIS — M1990 Unspecified osteoarthritis, unspecified site: Secondary | ICD-10-CM | POA: Diagnosis not present

## 2015-05-25 DIAGNOSIS — M5010 Cervical disc disorder with radiculopathy, unspecified cervical region: Secondary | ICD-10-CM | POA: Diagnosis not present

## 2015-05-25 DIAGNOSIS — J449 Chronic obstructive pulmonary disease, unspecified: Secondary | ICD-10-CM | POA: Diagnosis not present

## 2015-05-25 DIAGNOSIS — J9611 Chronic respiratory failure with hypoxia: Secondary | ICD-10-CM | POA: Diagnosis not present

## 2015-05-25 DIAGNOSIS — N186 End stage renal disease: Secondary | ICD-10-CM | POA: Diagnosis not present

## 2015-05-25 DIAGNOSIS — G4733 Obstructive sleep apnea (adult) (pediatric): Secondary | ICD-10-CM | POA: Diagnosis not present

## 2015-05-25 LAB — URINE CULTURE

## 2015-05-28 ENCOUNTER — Ambulatory Visit (INDEPENDENT_AMBULATORY_CARE_PROVIDER_SITE_OTHER): Payer: Commercial Managed Care - HMO | Admitting: Cardiovascular Disease

## 2015-05-28 ENCOUNTER — Encounter: Payer: Self-pay | Admitting: Cardiovascular Disease

## 2015-05-28 VITALS — BP 153/99 | HR 81 | Ht 61.0 in | Wt 230.8 lb

## 2015-05-28 DIAGNOSIS — R0602 Shortness of breath: Secondary | ICD-10-CM

## 2015-05-28 DIAGNOSIS — I5032 Chronic diastolic (congestive) heart failure: Secondary | ICD-10-CM

## 2015-05-28 DIAGNOSIS — I10 Essential (primary) hypertension: Secondary | ICD-10-CM | POA: Diagnosis not present

## 2015-05-28 DIAGNOSIS — R001 Bradycardia, unspecified: Secondary | ICD-10-CM

## 2015-05-28 DIAGNOSIS — I495 Sick sinus syndrome: Secondary | ICD-10-CM

## 2015-05-28 NOTE — Patient Instructions (Signed)
Medication Instructions: Continue same medications.   Labwork: None.   Procedures/Testing: None.   Follow-Up: 6 months with Dr. Lynzee Lindquist.   Any Additional Special Instructions Will Be Listed Below (If Applicable).   

## 2015-05-28 NOTE — Progress Notes (Signed)
Primary care physician: Dr. Ronette Deter  HPI  This is a 69 -year-old female who is here today for a followup visit.  She has known history of chronic diastolic heart failure with pulmonary hypertension, bradycardia s/p permanent pacemker and chronic atypical chest pain thought to be due to costochondritis. She has chronic left lower extremity swelling  likely due to lymphedema .  Cardiac catheterization in August of 2013 showed no evidence of obstructive coronary artery disease. She was noted to be severely hypertensive with significant elevation in left ventricular end-diastolic pressure.  She was hospitalized in July, 2014 at [facility] for severe chest pain. Cardiac workup was overall negative. CT of the chest showed possible peripheral pulmonary emboli which really did not explain her symptoms. She was anticoagulated which was complicated by subarachnoid hemorrhage and cardiopulmonary arrest. She was transferred to [facility] and was treated conservatively. Anticoagulation was discontinued.[name] She was discharged home but readmitted in August at [facility] with septic shock and acute renal failure requiring brief dialysis. She ultimately improved. Renal function went back to normal.  She underwent a dual-chamber pacemaker placement by Dr. Caryl Comes in [phone] for symptomatic bradycardia.  She has been relatively stable from a cardiac standpoint. She was hospitalized at [facility] in October for left sided pain of unclear etiology. She was discharged to rehab.   Allergies  Allergen Reactions  . Penicillins Hives, Shortness Of Breath, Swelling and Other (See Comments)    Has patient had a PCN reaction causing immediate rash, facial/tongue/throat swelling, SOB or lightheadedness with hypotension: Yes Has patient had a PCN reaction causing severe rash involving mucus membranes or skin necrosis: No Has patient had a PCN reaction that required hospitalization No Has patient had a PCN reaction occurring within the last 10  years: No If all of the above answers are "NO", then may proceed with Cephalosporin use.  . Aspirin Hives and Other (See Comments)    Pt states that she is unable to take because she had bleeding in her brain.    . Other Other (See Comments)    Pt states that she is unable to take blood thinners because she had bleeding in her brain.      Current Outpatient Prescriptions on File Prior to Visit  Medication Sig Dispense Refill  . acetaminophen (TYLENOL) 325 MG tablet Take 325-650 mg by mouth every 6 (six) hours as needed for mild pain.     [name] ambrisentan (LETAIRIS) 5 MG tablet Take 1 tablet (5 mg total) by mouth daily. 30 tablet 5  . cyclobenzaprine (FLEXERIL) 5 MG tablet Take 5 mg by mouth 3 (three) times daily as needed for muscle spasms.    . diazepam (VALIUM) 5 MG tablet Take 1 tablet (5 mg total) by mouth every 8 (eight) hours as needed for muscle spasms (and/or neck pain). 30 tablet 0  . glipiZIDE (GLUCOTROL) 5 MG tablet Take 0.5 tablets (2.5 mg total) by mouth daily before breakfast. 60 tablet 3  . insulin detemir (LEVEMIR) 100 UNIT/ML injection Inject 0.4 mLs (40 Units total) into the skin at bedtime. 10 mL 5  . meclizine (ANTIVERT) 25 MG tablet Take 25 mg by mouth daily as needed for dizziness.    . Menthol, Topical Analgesic, (BIOFREEZE EX) Apply 1 application topically as needed (for pain).     . naproxen (EC NAPROSYN) 500 MG EC tablet Take 500 mg by mouth 2 (two) times daily as needed (for pain).    . ondansetron (ZOFRAN) 8 MG tablet Take 1 tablet (  8 mg total) by mouth every 8 (eight) hours as needed for nausea or vomiting. 30 tablet 2  . oxyCODONE-acetaminophen (PERCOCET/ROXICET) 5-325 MG tablet Take 1 tablet by mouth every 8 (eight) hours as needed for severe pain. 30 tablet 0  . polyethylene glycol (MIRALAX / GLYCOLAX) packet Take 17 g by mouth daily as needed for moderate constipation.    . potassium chloride SA (KLOR-CON M20) 20 MEQ tablet Take 1 tablet (20 mEq total) by mouth 2  (two) times daily. 60 tablet 6  . promethazine (PHENERGAN) 25 MG tablet Take 25 mg by mouth every 4 (four) hours as needed for nausea or vomiting.     . torsemide (DEMADEX) 20 MG tablet Take 1 tablet (20 mg total) by mouth 2 (two) times daily. 60 tablet 6  . verapamil (CALAN-SR) 240 MG CR tablet Take 240 mg by mouth daily.     No current facility-administered medications on file prior to visit.     Past Medical History  Diagnosis Date  . Thyroid disorder   . Diabetes mellitus ([place])     type II  . Thyroid nodule   . Sleep apnea   . Obesity   . Hypertension   . Hyperlipidemia   . Urinary incontinence   . CHF (congestive heart failure) ([place]) 2005    Dx at [facility]  . Chronic back pain   . COPD (chronic obstructive pulmonary disease) ([place])     2L [place]  . Sleep apnea   . Lipoma of back   . Arthritis   . Streptococcal bacteremia   . Acute renal failure ([place])   . Stroke ([place])   . Sepsis with metabolic encephalopathy ([place])   . Gastritis   . Symptomatic bradycardia   . Chronic respiratory failure ([facility])     a. on home O2     Past Surgical History  Procedure Laterality Date  . Cholecystectomy    . Vaginal hysterectomy    . Cardiac catheterization  2013    @ [place]: No obstructive CAD: Only 20% ostial left circumflex.  . Abdominal hysterectomy  1969  . Cyst removal trunk  2002    BACK  . Lipoma removal  2014    back  . Pacemaker insertion  2015    [place] dual chamber pacemaker implanted by Dr Caryl Comes for symptomatic bradycardia  . Insert / replace / remove pacemaker    . Permanent pacemaker insertion N/A 01/09/2014    Procedure: PERMANENT PACEMAKER INSERTION;  Surgeon: Deboraha Sprang, MD;  Location: [facility] CATH LAB;  Service: Cardiovascular;  Laterality: N/A;  . Trigger finger release       Family History  Problem Relation Age of Onset  . Heart disease Mother   . Hypertension Mother   . COPD Mother     was a smoker  . Thyroid disease Mother   . Hypertension Father     . Hypertension Sister   . Thyroid disease Sister   . Hypertension Sister   . Thyroid disease Sister   . Breast cancer Maternal Aunt      Social History   Social History  . Marital Status: Divorced    Spouse Name: N/A  . Number of Children: N/A  . Years of Education: N/A   Occupational History  . Retired- factory work     exposed to [place]  . Smoking status: Former Smoker -- 0.30 packs/day for 2 years    Types: Cigarettes  Quit date: 06/23/1990  . Smokeless tobacco: Never Used  . Alcohol Use: No  . Drug Use: No  . Sexual Activity: Not on file   Other Topics Concern  . Not on file   Social History Narrative   Lives in [place] with daughter. Has 4 children. No pets.      Work - Restaurant manager, fast food, retired      Diet - regular       PHYSICAL EXAM   BP 153/99 mmHg  Pulse 81  Ht 5\' 1"  (1.549 m)  Wt 230 lb 12 oz (104.668 kg)  BMI 43.62 kg/m2 Constitutional: She is oriented to person, place, and time. She appears well-developed and well-nourished. No distress.  HENT: No nasal discharge.  Head: Normocephalic and atraumatic.  Eyes: Pupils are equal and round. Right eye exhibits no discharge. Left eye exhibits no discharge.  Neck: Normal range of motion. Neck supple. No JVD present. No thyromegaly present.  Cardiovascular: Normal rate, regular rhythm, normal heart sounds. Exam reveals no gallop and no friction rub. No murmur heard.  Pulmonary/Chest: Effort normal and breath sounds normal. No stridor. No respiratory distress. She has no wheezes. She has no rales. She exhibits no tenderness.  Abdominal: Soft. Bowel sounds are normal. She exhibits no distension. There is no tenderness. There is no rebound and no guarding.  Musculoskeletal: Normal range of motion. She exhibits chronic left lower extremity edema and no tenderness.  Neurological: She is alert and oriented to person, place, and time. Coordination normal.  Skin: Skin is warm  and dry. No rash noted. She is not diaphoretic. No erythema. No pallor.  Psychiatric: She has a normal mood and affect. Her behavior is normal. Judgment and thought content normal.   EKG: Sinus rhythm with sinus arrhythmia, LVH and nonspecific T wave changes.   ASSESSMENT AND PLAN

## 2015-05-29 ENCOUNTER — Telehealth: Payer: Self-pay | Admitting: *Deleted

## 2015-05-29 DIAGNOSIS — I132 Hypertensive heart and chronic kidney disease with heart failure and with stage 5 chronic kidney disease, or end stage renal disease: Secondary | ICD-10-CM | POA: Diagnosis not present

## 2015-05-29 DIAGNOSIS — J449 Chronic obstructive pulmonary disease, unspecified: Secondary | ICD-10-CM | POA: Diagnosis not present

## 2015-05-29 DIAGNOSIS — I5030 Unspecified diastolic (congestive) heart failure: Secondary | ICD-10-CM | POA: Diagnosis not present

## 2015-05-29 DIAGNOSIS — E1122 Type 2 diabetes mellitus with diabetic chronic kidney disease: Secondary | ICD-10-CM | POA: Diagnosis not present

## 2015-05-29 DIAGNOSIS — M5010 Cervical disc disorder with radiculopathy, unspecified cervical region: Secondary | ICD-10-CM | POA: Diagnosis not present

## 2015-05-29 DIAGNOSIS — M1990 Unspecified osteoarthritis, unspecified site: Secondary | ICD-10-CM | POA: Diagnosis not present

## 2015-05-29 DIAGNOSIS — N186 End stage renal disease: Secondary | ICD-10-CM | POA: Diagnosis not present

## 2015-05-29 DIAGNOSIS — G4733 Obstructive sleep apnea (adult) (pediatric): Secondary | ICD-10-CM | POA: Diagnosis not present

## 2015-05-29 DIAGNOSIS — J9611 Chronic respiratory failure with hypoxia: Secondary | ICD-10-CM | POA: Diagnosis not present

## 2015-05-29 NOTE — Telephone Encounter (Signed)
Santiago Glad from [facility] has requested a order for a shower chair with back. This order can be faxed to [phone] [place]. Please include height and weight.

## 2015-05-29 NOTE — Telephone Encounter (Signed)
Fine to send order. 

## 2015-05-29 NOTE — Assessment & Plan Note (Signed)
S/p PPM . Followed by our device clinic.

## 2015-05-29 NOTE — Assessment & Plan Note (Signed)
BP is elevated today but recent readings have been reasonable. Continue Verapamil for now. We can consider adding an ACE I/ARB in the future if needed.

## 2015-05-29 NOTE — Assessment & Plan Note (Signed)
She is stable from a cardiac standpoint with no evidence of volume overload on current dose of Torsemide 20 mg bid. I advised her to continue with low sodium diet and daily weights.

## 2015-05-30 ENCOUNTER — Encounter: Payer: Self-pay | Admitting: Internal Medicine

## 2015-05-30 ENCOUNTER — Ambulatory Visit (INDEPENDENT_AMBULATORY_CARE_PROVIDER_SITE_OTHER): Payer: Commercial Managed Care - HMO | Admitting: Internal Medicine

## 2015-05-30 ENCOUNTER — Other Ambulatory Visit: Payer: Self-pay | Admitting: *Deleted

## 2015-05-30 VITALS — BP 148/85 | HR 91 | Temp 98.6°F | Ht 61.0 in | Wt 232.0 lb

## 2015-05-30 DIAGNOSIS — J9611 Chronic respiratory failure with hypoxia: Secondary | ICD-10-CM

## 2015-05-30 DIAGNOSIS — I132 Hypertensive heart and chronic kidney disease with heart failure and with stage 5 chronic kidney disease, or end stage renal disease: Secondary | ICD-10-CM | POA: Diagnosis not present

## 2015-05-30 DIAGNOSIS — H532 Diplopia: Secondary | ICD-10-CM

## 2015-05-30 DIAGNOSIS — M25512 Pain in left shoulder: Secondary | ICD-10-CM

## 2015-05-30 DIAGNOSIS — I10 Essential (primary) hypertension: Secondary | ICD-10-CM | POA: Diagnosis not present

## 2015-05-30 DIAGNOSIS — J449 Chronic obstructive pulmonary disease, unspecified: Secondary | ICD-10-CM | POA: Diagnosis not present

## 2015-05-30 DIAGNOSIS — M7582 Other shoulder lesions, left shoulder: Secondary | ICD-10-CM | POA: Diagnosis not present

## 2015-05-30 DIAGNOSIS — M5010 Cervical disc disorder with radiculopathy, unspecified cervical region: Secondary | ICD-10-CM | POA: Diagnosis not present

## 2015-05-30 DIAGNOSIS — M5412 Radiculopathy, cervical region: Secondary | ICD-10-CM | POA: Diagnosis not present

## 2015-05-30 DIAGNOSIS — G4733 Obstructive sleep apnea (adult) (pediatric): Secondary | ICD-10-CM | POA: Diagnosis not present

## 2015-05-30 DIAGNOSIS — I5030 Unspecified diastolic (congestive) heart failure: Secondary | ICD-10-CM | POA: Diagnosis not present

## 2015-05-30 DIAGNOSIS — E1122 Type 2 diabetes mellitus with diabetic chronic kidney disease: Secondary | ICD-10-CM | POA: Diagnosis not present

## 2015-05-30 DIAGNOSIS — E118 Type 2 diabetes mellitus with unspecified complications: Secondary | ICD-10-CM | POA: Diagnosis not present

## 2015-05-30 DIAGNOSIS — N186 End stage renal disease: Secondary | ICD-10-CM | POA: Diagnosis not present

## 2015-05-30 DIAGNOSIS — Z794 Long term (current) use of insulin: Secondary | ICD-10-CM

## 2015-05-30 DIAGNOSIS — M1990 Unspecified osteoarthritis, unspecified site: Secondary | ICD-10-CM | POA: Diagnosis not present

## 2015-05-30 DIAGNOSIS — J9612 Chronic respiratory failure with hypercapnia: Secondary | ICD-10-CM

## 2015-05-30 MED ORDER — INSULIN DETEMIR 100 UNIT/ML ~~LOC~~ SOLN: 20.0000 [IU] | Freq: Every day | SUBCUTANEOUS | Status: DC

## 2015-05-30 MED ORDER — CYCLOBENZAPRINE HCL 5 MG PO TABS: 5.0000 mg | ORAL_TABLET | Freq: Three times a day (TID) | ORAL | Status: DC | PRN

## 2015-05-30 NOTE — Assessment & Plan Note (Signed)
BP Readings from Last 3 Encounters:  05/30/15 148/85  05/28/15 153/99  05/01/15 138/78   BP improved and well controlled at home. Continue current medicatoins.

## 2015-05-30 NOTE — Progress Notes (Signed)
Pre visit review using our clinic review tool, if applicable. No additional management support is needed unless otherwise documented below in the visit note. 

## 2015-05-30 NOTE — Assessment & Plan Note (Signed)
Longstanding diplopia, over 2 years. EOM normal. Will set up dilated eye exam at [facility]. Encouraged better control of blood sugars.

## 2015-05-30 NOTE — Patient Instructions (Signed)
Start back on Levemir 20units daily.  Monitor blood sugars. Call if blood sugars consistently over 250.  We will set up an eye exam.  Follow up in 1 month.

## 2015-05-30 NOTE — Assessment & Plan Note (Signed)
BG elevated and had steroid injection left shoulder today. Will start back Levemir at 20units daily. Email or call if BG<70 or >250. Follow up recheck A1c in 1 month.

## 2015-05-30 NOTE — Progress Notes (Addendum)
Subjective:    Patient ID: Debra Saunders, female    DOB: 10-09-1945, 69 y.o.   MRN: [id number]  HPI  69YO female presents for follow up.  Recently seen by Dr. Fletcher Anon.  No changes made to medication, however BP noted to be elevated at that visit, and considered adding ACEi/ARB.  HTN - BP when checked at home typically 120/60-70s.  Left neck/shoulder pain - Seen by Neurology. Dose of Gabapentin increased. Also left subacromial steroid injection with improvement in pain. Plans to continue PT at home. Considering evaluation of ESI.  Diplopia - Having some double vision when looking laterally. Ongoing for months, prior to stroke 2 years ago. Feels like this is getting worse. Seen by [facility] in past, but not this year.  DM - Has been off Levemir since beginning of November. BG are elevated, mostly over 200.   Wt Readings from Last 3 Encounters:  05/30/15 232 lb (105.235 kg)  05/28/15 230 lb 12 oz (104.668 kg)  05/01/15 230 lb (104.327 kg)   BP Readings from Last 3 Encounters:  05/30/15 148/85  05/28/15 153/99  05/01/15 138/78    Past Medical History  Diagnosis Date  . Thyroid disorder   . Diabetes mellitus ([place])     type II  . Thyroid nodule   . Sleep apnea   . Obesity   . Hypertension   . Hyperlipidemia   . Urinary incontinence   . CHF (congestive heart failure) ([place]) 2005    Dx at [facility]  . Chronic back pain   . COPD (chronic obstructive pulmonary disease) ([place])     2L [place]  . Sleep apnea   . Lipoma of back   . Arthritis   . Streptococcal bacteremia   . Acute renal failure ([place])   . Stroke ([place])   . Sepsis with metabolic encephalopathy ([place])   . Gastritis   . Symptomatic bradycardia   . Chronic respiratory failure (HCC)     a. on home O2   Family History  Problem Relation Age of Onset  . Heart disease Mother   . Hypertension Mother   . COPD Mother     was a smoker  . Thyroid disease Mother   . Hypertension Father   . Hypertension Sister   .  Thyroid disease Sister   . Hypertension Sister   . Thyroid disease Sister   . Breast cancer Maternal Aunt    Past Surgical History  Procedure Laterality Date  . Cholecystectomy    . Vaginal hysterectomy    . Cardiac catheterization  2013    @ [facility]: No obstructive CAD: Only 20% ostial left circumflex.  . Abdominal hysterectomy  1969  . Cyst removal trunk  2002    BACK  . Lipoma removal  2014    back  . Pacemaker insertion  2015    [place] dual chamber pacemaker implanted by Dr Caryl Comes for symptomatic bradycardia  . Insert / replace / remove pacemaker    . Permanent pacemaker insertion N/A 01/09/2014    Procedure: PERMANENT PACEMAKER INSERTION;  Surgeon: Deboraha Sprang, MD;  Location: [facility] CATH LAB;  Service: Cardiovascular;  Laterality: N/A;  . Trigger finger release     Social History   Social History  . Marital Status: Divorced    Spouse Name: N/A  . Number of Children: N/A  . Years of Education: N/A   Occupational History  . Retired- factory work     exposed to [employer]  Social History Main Topics  . Smoking status: Former Smoker -- 0.30 packs/day for 2 years    Types: Cigarettes    Quit date: 06/23/1990  . Smokeless tobacco: Never Used  . Alcohol Use: No  . Drug Use: No  . Sexual Activity: Not Asked   Other Topics Concern  . None   Social History Narrative   Lives in [place] with daughter. Has 4 children. No pets.      Work - Restaurant manager, fast food, retired      Diet - regular    Review of Systems  Constitutional: Negative for fever, chills, appetite change, fatigue and unexpected weight change.  Eyes: Negative for visual disturbance.  Respiratory: Negative for cough and shortness of breath.   Cardiovascular: Negative for chest pain, palpitations and leg swelling.  Gastrointestinal: Negative for nausea, abdominal pain, diarrhea and constipation.  Musculoskeletal: Positive for myalgias and arthralgias. Negative for gait problem.  Skin: Negative for  color change and rash.  Neurological: Negative for dizziness, weakness and light-headedness.  Hematological: Negative for adenopathy. Does not bruise/bleed easily.  Psychiatric/Behavioral: Negative for sleep disturbance and dysphoric mood. The patient is not nervous/anxious.        Objective:    BP 148/85 mmHg  Pulse 91  Temp(Src) 98.6 F (37 C) (Oral)  Ht 5\' 1"  (1.549 m)  Wt 232 lb (105.235 kg)  BMI 43.86 kg/m2  SpO2 91% Physical Exam  Constitutional: She is oriented to person, place, and time. She appears well-developed and well-nourished. No distress.  HENT:  Head: Normocephalic and atraumatic.  Right Ear: External ear normal.  Left Ear: External ear normal.  Nose: Nose normal.  Mouth/Throat: Oropharynx is clear and moist.  Eyes: Conjunctivae are normal. Pupils are equal, round, and reactive to light. Right eye exhibits no discharge. Left eye exhibits no discharge. No scleral icterus.  Neck: Normal range of motion. Neck supple. No tracheal deviation present. No thyromegaly present.  Cardiovascular: Normal rate, regular rhythm, normal heart sounds and intact distal pulses.  Exam reveals no gallop and no friction rub.   No murmur heard. Pulmonary/Chest: Effort normal and breath sounds normal. No respiratory distress. She has no wheezes. She has no rales. She exhibits no tenderness.  Musculoskeletal: Normal range of motion. She exhibits no edema or tenderness.  Lymphadenopathy:    She has no cervical adenopathy.  Neurological: She is alert and oriented to person, place, and time. No cranial nerve deficit. She exhibits normal muscle tone. Coordination normal.  Skin: Skin is warm and dry. No rash noted. She is not diaphoretic. No erythema. No pallor.  Psychiatric: She has a normal mood and affect. Her behavior is normal. Judgment and thought content normal.          Assessment & Plan:   Problem List Items Addressed This Visit      Unprioritized   Chronic respiratory  failure with hypoxia (HCC) (Chronic)    Symptomatically stable. Continue supplemental oxygen. Follow up 1 month and prn.      Diabetes mellitus type 2, controlled ([place]) - Primary (Chronic)    BG elevated and had steroid injection left shoulder today. Will start back Levemir at 20units daily. Email or call if BG<70 or >250. Follow up recheck A1c in 1 month.      Relevant Medications   insulin detemir (LEVEMIR) 100 UNIT/ML injection   Diplopia    Longstanding diplopia, over 2 years. EOM normal. Will set up dilated eye exam at [facility]. Encouraged better control of  blood sugars.      Relevant Orders   Ambulatory referral to Ophthalmology   Hypertension (Chronic)    BP Readings from Last 3 Encounters:  05/30/15 148/85  05/28/15 153/99  05/01/15 138/78   BP improved and well controlled at home. Continue current medicatoins.      Left shoulder pain    Symptoms improved with steroid injection. Follow up with ortho as scheduled.          Return in about 5 weeks (around 07/04/2015) for Recheck of Diabetes.

## 2015-05-30 NOTE — Assessment & Plan Note (Signed)
Symptomatically stable. Continue supplemental oxygen. Follow up 1 month and prn.

## 2015-05-30 NOTE — Assessment & Plan Note (Signed)
Symptoms improved with steroid injection. Follow up with ortho as scheduled.

## 2015-05-31 ENCOUNTER — Other Ambulatory Visit: Payer: Self-pay | Admitting: *Deleted

## 2015-05-31 DIAGNOSIS — J9611 Chronic respiratory failure with hypoxia: Secondary | ICD-10-CM | POA: Diagnosis not present

## 2015-05-31 DIAGNOSIS — M1990 Unspecified osteoarthritis, unspecified site: Secondary | ICD-10-CM | POA: Diagnosis not present

## 2015-05-31 DIAGNOSIS — I132 Hypertensive heart and chronic kidney disease with heart failure and with stage 5 chronic kidney disease, or end stage renal disease: Secondary | ICD-10-CM | POA: Diagnosis not present

## 2015-05-31 DIAGNOSIS — N186 End stage renal disease: Secondary | ICD-10-CM | POA: Diagnosis not present

## 2015-05-31 DIAGNOSIS — I5030 Unspecified diastolic (congestive) heart failure: Secondary | ICD-10-CM | POA: Diagnosis not present

## 2015-05-31 DIAGNOSIS — J449 Chronic obstructive pulmonary disease, unspecified: Secondary | ICD-10-CM | POA: Diagnosis not present

## 2015-05-31 DIAGNOSIS — E1122 Type 2 diabetes mellitus with diabetic chronic kidney disease: Secondary | ICD-10-CM | POA: Diagnosis not present

## 2015-05-31 DIAGNOSIS — M5010 Cervical disc disorder with radiculopathy, unspecified cervical region: Secondary | ICD-10-CM | POA: Diagnosis not present

## 2015-05-31 DIAGNOSIS — G4733 Obstructive sleep apnea (adult) (pediatric): Secondary | ICD-10-CM | POA: Diagnosis not present

## 2015-05-31 NOTE — Patient Outreach (Addendum)
Debra Saunders) Care Management   05/31/2015  Debra Saunders [birth year] [id number]  Debra Saunders is an 69 y.o. female  Subjective: "I haven't changed my tubing for about a year on my 02 and they did not give me a new mask with my new cpap machine." "I ran out of 02 when I was at the doctor the other day and I had to use theirs, the portable tanks are so heavy and with my neck being sore I can't manage it." "I have a b/p cuff but I haven't been recording it." "Since the shot in my neck my left arm has felt better and I am getting some of my movement back."  Objective:  Blood pressure 138/72, pulse 66, resp. rate 18, weight 230 lb (104.327 kg), SpO2 98 % on 3lpm 02. Review of Systems  Cardiovascular: Positive for leg swelling.  Gastrointestinal: Positive for nausea.  Neurological: Positive for weakness.    Physical Exam  Constitutional: She is oriented to person, place, and time. She appears well-developed and well-nourished.  Neck: Muscular tenderness present. Decreased range of motion present.  Cardiovascular: Normal rate, regular rhythm and normal heart sounds.   Pulses:      Radial pulses are 2+ on the right side, and 2+ on the left side.       Posterior tibial pulses are 1+ on the right side, and 1+ on the left side.  Respiratory: Effort normal and breath sounds normal.  On 3lpm of 02  GI: Soft. Bowel sounds are normal.  Musculoskeletal: She exhibits edema.       Left ankle: She exhibits swelling.       Left lower leg: She exhibits swelling.  Neurological: She is alert and oriented to person, place, and time.  Skin: Skin is warm and dry.  Psychiatric: She has a normal mood and affect. Her speech is normal and behavior is normal. Judgment and thought content normal. Cognition and memory are normal.    Current Medications:   Current Outpatient Prescriptions  Medication Sig Dispense Refill  . acetaminophen (TYLENOL) 325 MG tablet Take 325-650 mg by mouth every  6 (six) hours as needed for mild pain.     [name] ambrisentan (LETAIRIS) 5 MG tablet Take 1 tablet (5 mg total) by mouth daily. 30 tablet 5  . cyclobenzaprine (FLEXERIL) 5 MG tablet Take 1 tablet (5 mg total) by mouth 3 (three) times daily as needed for muscle spasms. 90 tablet 2  . diazepam (VALIUM) 5 MG tablet Take 1 tablet (5 mg total) by mouth every 8 (eight) hours as needed for muscle spasms (and/or neck pain). 30 tablet 0  . gabapentin (NEURONTIN) 100 MG capsule Take 200 mg by mouth 3 (three) times daily.    [name] glipiZIDE (GLUCOTROL) 5 MG tablet Take 0.5 tablets (2.5 mg total) by mouth daily before breakfast. 60 tablet 3  . insulin detemir (LEVEMIR) 100 UNIT/ML injection Inject 0.2 mLs (20 Units total) into the skin at bedtime. 10 mL 5  . meclizine (ANTIVERT) 25 MG tablet Take 25 mg by mouth daily as needed for dizziness.    . Menthol, Topical Analgesic, (BIOFREEZE EX) Apply 1 application topically as needed (for pain).     . naproxen (EC NAPROSYN) 500 MG EC tablet Take 500 mg by mouth 2 (two) times daily as needed (for pain).    . ondansetron (ZOFRAN) 8 MG tablet Take 1 tablet (8 mg total) by mouth every 8 (eight) hours as needed for nausea or  vomiting. 30 tablet 2  . oxyCODONE-acetaminophen (PERCOCET/ROXICET) 5-325 MG tablet Take 1 tablet by mouth every 8 (eight) hours as needed for severe pain. 30 tablet 0  . polyethylene glycol (MIRALAX / GLYCOLAX) packet Take 17 g by mouth daily as needed for moderate constipation.    . potassium chloride SA (KLOR-CON M20) 20 MEQ tablet Take 1 tablet (20 mEq total) by mouth 2 (two) times daily. 60 tablet 6  . promethazine (PHENERGAN) 25 MG tablet Take 25 mg by mouth every 4 (four) hours as needed for nausea or vomiting.     . torsemide (DEMADEX) 20 MG tablet Take 1 tablet (20 mg total) by mouth 2 (two) times daily. 60 tablet 6  . verapamil (CALAN-SR) 240 MG CR tablet Take 240 mg by mouth daily.     No current facility-administered medications for this visit.     Functional Status:   In your present state of health, do you have any difficulty performing the following activities: 05/15/2015 05/01/2015  Hearing? N -  Vision? Y -  Difficulty concentrating or making decisions? N -  Walking or climbing stairs? Y -  Dressing or bathing? Y -  Doing errands, shopping? Y -  Conservation officer, nature and eating ? - N  Using the Toilet? - Y  In the past six months, have you accidently leaked urine? - Y  Do you have problems with loss of bowel control? - N  Managing your Medications? - Y  Managing your Finances? - Y  Housekeeping or managing your Housekeeping? - Y    Fall/Depression Screening:    PHQ 2/9 Scores 05/01/2015 04/12/2015 06/29/2014 10/06/2013 07/09/2012 04/28/2012  PHQ - 2 Score 0 0 0 0 0 0   Fall Risk  05/31/2015 05/01/2015 04/12/2015 06/29/2014 07/09/2012  Falls in the past year? No No No No No  Risk for fall due to : - Impaired mobility;Impaired balance/gait;Medication side effect;Impaired vision Impaired mobility;Impaired balance/gait - -    Assessment: Pt noted to be moving better and able to raise her left arm with out severe pain. Pt reporting ortho doctor gave her an injection that has really improved her pain. Pt continues to need assistance with adls but reports a marked improvement from 1 week ago.   HF: Lungs clear. Pt weighing and recording to monitor fluid. Pt with swelling noted to left lower leg which is chronic for her. Right leg without edema. Pt able to verbalize s/s of HF and locate information to refer to for HF zones. Pt c/o nausea which she states is a chronic problem for her. Denies increase SOB or loss of appetite. Pt continues on 02 at 3 lpm. Encouraged to use incentive spirometer related to pt stating she was weak and tired from recent hospitalizations and resting in bed more than she used to prior to acute care admit.   Diabetes: Sugars controlled at this time. Pt using glucometer and recording sugars diligently. Bilat feet with skin  intact. Dry skin noted. Pt with +1 pedal pulses bilat.   Medications:  Pt has all medications present in her home and reports taking them appropriately.  Needs recognized: New 02 tubing, New Cpap mask, portable 02 concentrator, pcs company lists,   Plan: RNCM will see pt in one month.   RNCM will make care coordination call to pt's primary care to request pt receive a smaller portable 02 system.  RNCM will make a care coordination call to [place] and ask her to send pt information on pcs companies.  Raychelle Hudman RN, BSN  [facility] Care Management [phone])  THN CM Care Plan Problem One        Most Recent Value   Care Plan Problem One  Pt recently d/c'd from hospital and SNF facility with multiple comorbidiities pt states she has a knowlege deficit of HF   Role Documenting the Problem One  Care Management Coordinator   Care Plan for Problem One  Active   THN Long Term Goal (31-90 days)  Pt will not be readmitted to acute setting in the next 90 days   THN Long Term Goal Start Date  04/25/15   Interventions for Problem One Long Term Goal  Transition of care calls started. RNCM called Nursoing home related to [facility] Saunders had not been in touch w pt post d/c.    THN CM Short Term Goal #1 (0-30 days)  Pt will see neurology as referred for pain in the next 30 days.   THN CM Short Term Goal #1 Start Date  05/01/15   [facility] CM Short Term Goal #1 Met Date  05/31/15   Interventions for Short Term Goal #1  RNCM called MD office related to pt had not heard from neurology office to assist in scheduling appt.   THN CM Short Term Goal #2 (0-30 days)  Pt will be able to name s/s of worsening HF according to the HF zones in the next 30 days.   THN CM Short Term Goal #2 Start Date  05/01/15   [facility] CM Short Term Goal #2 Met Date  05/31/15   Interventions for Short Term Goal #2  RNCM reviewed s/s of HF with [facility] calendar teaching on zones. Encouraged pt to weigh daily and record.    THN CM Short Term Goal #3 (0-30 days)  Pt  will use her incentive spirometer 2 - 3 times per day for the next 30 days.    THN CM Short Term Goal #3 Start Date  05/31/15   Interventions for Short Tern Goal #3  RNCM educated pt on the benefits of exercising her lungs to decrease risk of pneumonia and assist in improving lung function.    THN CM Short Term Goal #4 (0-30 days)  Pt will obtain new 02 tubing, new cpap mask and lighter portable concentrator in the next 30 days.    THN CM Short Term Goal #4 Start Date  05/31/15   Interventions for Short Term Goal #4  RNCM discussed with pt the benefits of changing tubing regularly for infection control prevention.

## 2015-06-01 DIAGNOSIS — E1122 Type 2 diabetes mellitus with diabetic chronic kidney disease: Secondary | ICD-10-CM | POA: Diagnosis not present

## 2015-06-01 DIAGNOSIS — J449 Chronic obstructive pulmonary disease, unspecified: Secondary | ICD-10-CM | POA: Diagnosis not present

## 2015-06-01 DIAGNOSIS — J9611 Chronic respiratory failure with hypoxia: Secondary | ICD-10-CM | POA: Diagnosis not present

## 2015-06-01 DIAGNOSIS — I5030 Unspecified diastolic (congestive) heart failure: Secondary | ICD-10-CM | POA: Diagnosis not present

## 2015-06-01 DIAGNOSIS — I132 Hypertensive heart and chronic kidney disease with heart failure and with stage 5 chronic kidney disease, or end stage renal disease: Secondary | ICD-10-CM | POA: Diagnosis not present

## 2015-06-01 DIAGNOSIS — M1990 Unspecified osteoarthritis, unspecified site: Secondary | ICD-10-CM | POA: Diagnosis not present

## 2015-06-01 DIAGNOSIS — G4733 Obstructive sleep apnea (adult) (pediatric): Secondary | ICD-10-CM | POA: Diagnosis not present

## 2015-06-01 DIAGNOSIS — M5010 Cervical disc disorder with radiculopathy, unspecified cervical region: Secondary | ICD-10-CM | POA: Diagnosis not present

## 2015-06-01 DIAGNOSIS — N186 End stage renal disease: Secondary | ICD-10-CM | POA: Diagnosis not present

## 2015-06-04 ENCOUNTER — Other Ambulatory Visit: Payer: Self-pay | Admitting: *Deleted

## 2015-06-04 DIAGNOSIS — J449 Chronic obstructive pulmonary disease, unspecified: Secondary | ICD-10-CM | POA: Diagnosis not present

## 2015-06-04 DIAGNOSIS — J9611 Chronic respiratory failure with hypoxia: Secondary | ICD-10-CM | POA: Diagnosis not present

## 2015-06-04 DIAGNOSIS — N186 End stage renal disease: Secondary | ICD-10-CM | POA: Diagnosis not present

## 2015-06-04 DIAGNOSIS — E1122 Type 2 diabetes mellitus with diabetic chronic kidney disease: Secondary | ICD-10-CM | POA: Diagnosis not present

## 2015-06-04 DIAGNOSIS — G4733 Obstructive sleep apnea (adult) (pediatric): Secondary | ICD-10-CM | POA: Diagnosis not present

## 2015-06-04 DIAGNOSIS — M5010 Cervical disc disorder with radiculopathy, unspecified cervical region: Secondary | ICD-10-CM | POA: Diagnosis not present

## 2015-06-04 DIAGNOSIS — I132 Hypertensive heart and chronic kidney disease with heart failure and with stage 5 chronic kidney disease, or end stage renal disease: Secondary | ICD-10-CM | POA: Diagnosis not present

## 2015-06-04 DIAGNOSIS — M1990 Unspecified osteoarthritis, unspecified site: Secondary | ICD-10-CM | POA: Diagnosis not present

## 2015-06-04 DIAGNOSIS — I5030 Unspecified diastolic (congestive) heart failure: Secondary | ICD-10-CM | POA: Diagnosis not present

## 2015-06-04 NOTE — Patient Outreach (Signed)
[facility]) Care Management  06/04/2015  Debra Saunders 1945/08/22 [id number]   Phone call to patient to follow up on referral to the [facility].  Per patient, she did not  receive the paperwork from them regarding assistance with the wheelchair ramp.  She did contact this agency and they will be sending her out another set of paperwork to complete and return.   Plan:  This Education officer, museum will follow up with patient in 2 weeks regarding paperwork.    [name] [facility] Care Management [phone]

## 2015-06-06 ENCOUNTER — Other Ambulatory Visit: Payer: Self-pay | Admitting: *Deleted

## 2015-06-06 ENCOUNTER — Encounter: Payer: Self-pay | Admitting: Internal Medicine

## 2015-06-06 DIAGNOSIS — E1122 Type 2 diabetes mellitus with diabetic chronic kidney disease: Secondary | ICD-10-CM | POA: Diagnosis not present

## 2015-06-06 DIAGNOSIS — N186 End stage renal disease: Secondary | ICD-10-CM | POA: Diagnosis not present

## 2015-06-06 DIAGNOSIS — I5030 Unspecified diastolic (congestive) heart failure: Secondary | ICD-10-CM | POA: Diagnosis not present

## 2015-06-06 DIAGNOSIS — J449 Chronic obstructive pulmonary disease, unspecified: Secondary | ICD-10-CM | POA: Diagnosis not present

## 2015-06-06 DIAGNOSIS — G4733 Obstructive sleep apnea (adult) (pediatric): Secondary | ICD-10-CM | POA: Diagnosis not present

## 2015-06-06 DIAGNOSIS — M5010 Cervical disc disorder with radiculopathy, unspecified cervical region: Secondary | ICD-10-CM | POA: Diagnosis not present

## 2015-06-06 DIAGNOSIS — J9611 Chronic respiratory failure with hypoxia: Secondary | ICD-10-CM | POA: Diagnosis not present

## 2015-06-06 DIAGNOSIS — I132 Hypertensive heart and chronic kidney disease with heart failure and with stage 5 chronic kidney disease, or end stage renal disease: Secondary | ICD-10-CM | POA: Diagnosis not present

## 2015-06-06 DIAGNOSIS — M1990 Unspecified osteoarthritis, unspecified site: Secondary | ICD-10-CM | POA: Diagnosis not present

## 2015-06-06 NOTE — Patient Outreach (Signed)
RNCM called pt to make her aware that [facility] had sent a message to the MD about portable 02 concentrator. Pt had not heard anything about the concentrator yet. RNCM let pt know the message was only placed recently. The pt also stated when she spoke with the [facility] they stated she would need an order to obtain a new cpap mask. Pt stated the one she had been wearing was approx 71 years old. RNCM advised pt to contact MD trough her MyChart account and request this order related to it not being an emergency. Pt also discussed with RNCM that her evening sugars have been high and wondered if she should take her Levimir in the am to cover her intake instead of at night. RNCM made pt aware she would consult [facility] Pharmacist about the timing of her insulin. RNCM discussed with pt the importance of watching carb intake, pt stated she was referring to education sheet.   Plan: RNCM will speak with [facility] Pharmacist about insulin timing. RNCM will contact pt next week to f/u about pt's 02 supplies and insulin.  Rutherford Limerick RN, BSN  [facility] Care Management [phone])

## 2015-06-07 DIAGNOSIS — E1122 Type 2 diabetes mellitus with diabetic chronic kidney disease: Secondary | ICD-10-CM | POA: Diagnosis not present

## 2015-06-07 DIAGNOSIS — M5010 Cervical disc disorder with radiculopathy, unspecified cervical region: Secondary | ICD-10-CM | POA: Diagnosis not present

## 2015-06-07 DIAGNOSIS — I5030 Unspecified diastolic (congestive) heart failure: Secondary | ICD-10-CM | POA: Diagnosis not present

## 2015-06-07 DIAGNOSIS — M1990 Unspecified osteoarthritis, unspecified site: Secondary | ICD-10-CM | POA: Diagnosis not present

## 2015-06-07 DIAGNOSIS — J9611 Chronic respiratory failure with hypoxia: Secondary | ICD-10-CM | POA: Diagnosis not present

## 2015-06-07 DIAGNOSIS — J449 Chronic obstructive pulmonary disease, unspecified: Secondary | ICD-10-CM | POA: Diagnosis not present

## 2015-06-07 DIAGNOSIS — N186 End stage renal disease: Secondary | ICD-10-CM | POA: Diagnosis not present

## 2015-06-07 DIAGNOSIS — I132 Hypertensive heart and chronic kidney disease with heart failure and with stage 5 chronic kidney disease, or end stage renal disease: Secondary | ICD-10-CM | POA: Diagnosis not present

## 2015-06-07 DIAGNOSIS — G4733 Obstructive sleep apnea (adult) (pediatric): Secondary | ICD-10-CM | POA: Diagnosis not present

## 2015-06-08 DIAGNOSIS — E1122 Type 2 diabetes mellitus with diabetic chronic kidney disease: Secondary | ICD-10-CM | POA: Diagnosis not present

## 2015-06-08 DIAGNOSIS — M1990 Unspecified osteoarthritis, unspecified site: Secondary | ICD-10-CM | POA: Diagnosis not present

## 2015-06-08 DIAGNOSIS — I5030 Unspecified diastolic (congestive) heart failure: Secondary | ICD-10-CM | POA: Diagnosis not present

## 2015-06-08 DIAGNOSIS — I132 Hypertensive heart and chronic kidney disease with heart failure and with stage 5 chronic kidney disease, or end stage renal disease: Secondary | ICD-10-CM | POA: Diagnosis not present

## 2015-06-08 DIAGNOSIS — N186 End stage renal disease: Secondary | ICD-10-CM | POA: Diagnosis not present

## 2015-06-08 DIAGNOSIS — G4733 Obstructive sleep apnea (adult) (pediatric): Secondary | ICD-10-CM | POA: Diagnosis not present

## 2015-06-08 DIAGNOSIS — J449 Chronic obstructive pulmonary disease, unspecified: Secondary | ICD-10-CM | POA: Diagnosis not present

## 2015-06-08 DIAGNOSIS — J9611 Chronic respiratory failure with hypoxia: Secondary | ICD-10-CM | POA: Diagnosis not present

## 2015-06-08 DIAGNOSIS — M5010 Cervical disc disorder with radiculopathy, unspecified cervical region: Secondary | ICD-10-CM | POA: Diagnosis not present

## 2015-06-12 DIAGNOSIS — I5030 Unspecified diastolic (congestive) heart failure: Secondary | ICD-10-CM | POA: Diagnosis not present

## 2015-06-12 DIAGNOSIS — M1990 Unspecified osteoarthritis, unspecified site: Secondary | ICD-10-CM | POA: Diagnosis not present

## 2015-06-12 DIAGNOSIS — M5010 Cervical disc disorder with radiculopathy, unspecified cervical region: Secondary | ICD-10-CM | POA: Diagnosis not present

## 2015-06-12 DIAGNOSIS — E1122 Type 2 diabetes mellitus with diabetic chronic kidney disease: Secondary | ICD-10-CM | POA: Diagnosis not present

## 2015-06-12 DIAGNOSIS — G4733 Obstructive sleep apnea (adult) (pediatric): Secondary | ICD-10-CM | POA: Diagnosis not present

## 2015-06-12 DIAGNOSIS — J449 Chronic obstructive pulmonary disease, unspecified: Secondary | ICD-10-CM | POA: Diagnosis not present

## 2015-06-12 DIAGNOSIS — I132 Hypertensive heart and chronic kidney disease with heart failure and with stage 5 chronic kidney disease, or end stage renal disease: Secondary | ICD-10-CM | POA: Diagnosis not present

## 2015-06-12 DIAGNOSIS — J9611 Chronic respiratory failure with hypoxia: Secondary | ICD-10-CM | POA: Diagnosis not present

## 2015-06-12 DIAGNOSIS — N186 End stage renal disease: Secondary | ICD-10-CM | POA: Diagnosis not present

## 2015-06-13 DIAGNOSIS — J449 Chronic obstructive pulmonary disease, unspecified: Secondary | ICD-10-CM | POA: Diagnosis not present

## 2015-06-13 DIAGNOSIS — J9611 Chronic respiratory failure with hypoxia: Secondary | ICD-10-CM | POA: Diagnosis not present

## 2015-06-13 DIAGNOSIS — N186 End stage renal disease: Secondary | ICD-10-CM | POA: Diagnosis not present

## 2015-06-13 DIAGNOSIS — I5030 Unspecified diastolic (congestive) heart failure: Secondary | ICD-10-CM | POA: Diagnosis not present

## 2015-06-13 DIAGNOSIS — G4733 Obstructive sleep apnea (adult) (pediatric): Secondary | ICD-10-CM | POA: Diagnosis not present

## 2015-06-13 DIAGNOSIS — M5010 Cervical disc disorder with radiculopathy, unspecified cervical region: Secondary | ICD-10-CM | POA: Diagnosis not present

## 2015-06-13 DIAGNOSIS — I132 Hypertensive heart and chronic kidney disease with heart failure and with stage 5 chronic kidney disease, or end stage renal disease: Secondary | ICD-10-CM | POA: Diagnosis not present

## 2015-06-13 DIAGNOSIS — E1122 Type 2 diabetes mellitus with diabetic chronic kidney disease: Secondary | ICD-10-CM | POA: Diagnosis not present

## 2015-06-13 DIAGNOSIS — M1990 Unspecified osteoarthritis, unspecified site: Secondary | ICD-10-CM | POA: Diagnosis not present

## 2015-06-14 ENCOUNTER — Telehealth: Payer: Self-pay | Admitting: *Deleted

## 2015-06-14 DIAGNOSIS — E1122 Type 2 diabetes mellitus with diabetic chronic kidney disease: Secondary | ICD-10-CM | POA: Diagnosis not present

## 2015-06-14 DIAGNOSIS — N186 End stage renal disease: Secondary | ICD-10-CM | POA: Diagnosis not present

## 2015-06-14 DIAGNOSIS — J449 Chronic obstructive pulmonary disease, unspecified: Secondary | ICD-10-CM | POA: Diagnosis not present

## 2015-06-14 DIAGNOSIS — H5021 Vertical strabismus, right eye: Secondary | ICD-10-CM | POA: Diagnosis not present

## 2015-06-14 DIAGNOSIS — I5030 Unspecified diastolic (congestive) heart failure: Secondary | ICD-10-CM | POA: Diagnosis not present

## 2015-06-14 DIAGNOSIS — J9611 Chronic respiratory failure with hypoxia: Secondary | ICD-10-CM | POA: Diagnosis not present

## 2015-06-14 DIAGNOSIS — G4733 Obstructive sleep apnea (adult) (pediatric): Secondary | ICD-10-CM | POA: Diagnosis not present

## 2015-06-14 DIAGNOSIS — M5010 Cervical disc disorder with radiculopathy, unspecified cervical region: Secondary | ICD-10-CM | POA: Diagnosis not present

## 2015-06-14 DIAGNOSIS — M1990 Unspecified osteoarthritis, unspecified site: Secondary | ICD-10-CM | POA: Diagnosis not present

## 2015-06-14 DIAGNOSIS — I132 Hypertensive heart and chronic kidney disease with heart failure and with stage 5 chronic kidney disease, or end stage renal disease: Secondary | ICD-10-CM | POA: Diagnosis not present

## 2015-06-14 LAB — HM DIABETES EYE EXAM

## 2015-06-14 NOTE — Telephone Encounter (Signed)
Santiago Glad from [facility] did not receive the order for the shower chair with the back. Please refax order [place], along with the physician order.

## 2015-06-14 NOTE — Telephone Encounter (Signed)
Re-faxed.

## 2015-06-15 DIAGNOSIS — I132 Hypertensive heart and chronic kidney disease with heart failure and with stage 5 chronic kidney disease, or end stage renal disease: Secondary | ICD-10-CM | POA: Diagnosis not present

## 2015-06-15 DIAGNOSIS — M1990 Unspecified osteoarthritis, unspecified site: Secondary | ICD-10-CM | POA: Diagnosis not present

## 2015-06-15 DIAGNOSIS — N186 End stage renal disease: Secondary | ICD-10-CM | POA: Diagnosis not present

## 2015-06-15 DIAGNOSIS — J9611 Chronic respiratory failure with hypoxia: Secondary | ICD-10-CM | POA: Diagnosis not present

## 2015-06-15 DIAGNOSIS — E1122 Type 2 diabetes mellitus with diabetic chronic kidney disease: Secondary | ICD-10-CM | POA: Diagnosis not present

## 2015-06-15 DIAGNOSIS — G4733 Obstructive sleep apnea (adult) (pediatric): Secondary | ICD-10-CM | POA: Diagnosis not present

## 2015-06-15 DIAGNOSIS — J449 Chronic obstructive pulmonary disease, unspecified: Secondary | ICD-10-CM | POA: Diagnosis not present

## 2015-06-15 DIAGNOSIS — M5010 Cervical disc disorder with radiculopathy, unspecified cervical region: Secondary | ICD-10-CM | POA: Diagnosis not present

## 2015-06-15 DIAGNOSIS — I5030 Unspecified diastolic (congestive) heart failure: Secondary | ICD-10-CM | POA: Diagnosis not present

## 2015-06-16 DIAGNOSIS — I509 Heart failure, unspecified: Secondary | ICD-10-CM | POA: Diagnosis not present

## 2015-06-19 ENCOUNTER — Ambulatory Visit: Payer: Commercial Managed Care - HMO | Admitting: Internal Medicine

## 2015-06-19 DIAGNOSIS — I132 Hypertensive heart and chronic kidney disease with heart failure and with stage 5 chronic kidney disease, or end stage renal disease: Secondary | ICD-10-CM | POA: Diagnosis not present

## 2015-06-19 DIAGNOSIS — G4733 Obstructive sleep apnea (adult) (pediatric): Secondary | ICD-10-CM | POA: Diagnosis not present

## 2015-06-19 DIAGNOSIS — I5030 Unspecified diastolic (congestive) heart failure: Secondary | ICD-10-CM | POA: Diagnosis not present

## 2015-06-19 DIAGNOSIS — J9611 Chronic respiratory failure with hypoxia: Secondary | ICD-10-CM | POA: Diagnosis not present

## 2015-06-19 DIAGNOSIS — E1122 Type 2 diabetes mellitus with diabetic chronic kidney disease: Secondary | ICD-10-CM | POA: Diagnosis not present

## 2015-06-19 DIAGNOSIS — N186 End stage renal disease: Secondary | ICD-10-CM | POA: Diagnosis not present

## 2015-06-19 DIAGNOSIS — J449 Chronic obstructive pulmonary disease, unspecified: Secondary | ICD-10-CM | POA: Diagnosis not present

## 2015-06-19 DIAGNOSIS — M1990 Unspecified osteoarthritis, unspecified site: Secondary | ICD-10-CM | POA: Diagnosis not present

## 2015-06-19 DIAGNOSIS — M5010 Cervical disc disorder with radiculopathy, unspecified cervical region: Secondary | ICD-10-CM | POA: Diagnosis not present

## 2015-06-21 DIAGNOSIS — E1122 Type 2 diabetes mellitus with diabetic chronic kidney disease: Secondary | ICD-10-CM | POA: Diagnosis not present

## 2015-06-21 DIAGNOSIS — G4733 Obstructive sleep apnea (adult) (pediatric): Secondary | ICD-10-CM | POA: Diagnosis not present

## 2015-06-21 DIAGNOSIS — I5030 Unspecified diastolic (congestive) heart failure: Secondary | ICD-10-CM | POA: Diagnosis not present

## 2015-06-21 DIAGNOSIS — J449 Chronic obstructive pulmonary disease, unspecified: Secondary | ICD-10-CM | POA: Diagnosis not present

## 2015-06-21 DIAGNOSIS — J9611 Chronic respiratory failure with hypoxia: Secondary | ICD-10-CM | POA: Diagnosis not present

## 2015-06-21 DIAGNOSIS — M5010 Cervical disc disorder with radiculopathy, unspecified cervical region: Secondary | ICD-10-CM | POA: Diagnosis not present

## 2015-06-21 DIAGNOSIS — M1990 Unspecified osteoarthritis, unspecified site: Secondary | ICD-10-CM | POA: Diagnosis not present

## 2015-06-21 DIAGNOSIS — N186 End stage renal disease: Secondary | ICD-10-CM | POA: Diagnosis not present

## 2015-06-21 DIAGNOSIS — I132 Hypertensive heart and chronic kidney disease with heart failure and with stage 5 chronic kidney disease, or end stage renal disease: Secondary | ICD-10-CM | POA: Diagnosis not present

## 2015-06-22 ENCOUNTER — Ambulatory Visit: Payer: Commercial Managed Care - HMO | Admitting: Internal Medicine

## 2015-06-25 DIAGNOSIS — I5030 Unspecified diastolic (congestive) heart failure: Secondary | ICD-10-CM | POA: Diagnosis not present

## 2015-06-25 DIAGNOSIS — J9611 Chronic respiratory failure with hypoxia: Secondary | ICD-10-CM | POA: Diagnosis not present

## 2015-06-25 DIAGNOSIS — M5010 Cervical disc disorder with radiculopathy, unspecified cervical region: Secondary | ICD-10-CM | POA: Diagnosis not present

## 2015-06-25 DIAGNOSIS — N186 End stage renal disease: Secondary | ICD-10-CM | POA: Diagnosis not present

## 2015-06-25 DIAGNOSIS — I132 Hypertensive heart and chronic kidney disease with heart failure and with stage 5 chronic kidney disease, or end stage renal disease: Secondary | ICD-10-CM | POA: Diagnosis not present

## 2015-06-25 DIAGNOSIS — J449 Chronic obstructive pulmonary disease, unspecified: Secondary | ICD-10-CM | POA: Diagnosis not present

## 2015-06-25 DIAGNOSIS — G4733 Obstructive sleep apnea (adult) (pediatric): Secondary | ICD-10-CM | POA: Diagnosis not present

## 2015-06-25 DIAGNOSIS — M1990 Unspecified osteoarthritis, unspecified site: Secondary | ICD-10-CM | POA: Diagnosis not present

## 2015-06-25 DIAGNOSIS — E1122 Type 2 diabetes mellitus with diabetic chronic kidney disease: Secondary | ICD-10-CM | POA: Diagnosis not present

## 2015-06-28 ENCOUNTER — Telehealth: Payer: Self-pay | Admitting: *Deleted

## 2015-06-28 ENCOUNTER — Other Ambulatory Visit: Payer: Self-pay | Admitting: *Deleted

## 2015-06-28 VITALS — BP 128/62 | HR 70 | Resp 20 | Wt 223.0 lb

## 2015-06-28 DIAGNOSIS — J811 Chronic pulmonary edema: Secondary | ICD-10-CM

## 2015-06-28 DIAGNOSIS — I272 Pulmonary hypertension, unspecified: Secondary | ICD-10-CM

## 2015-06-28 NOTE — Patient Outreach (Signed)
[facility]) Care Management   06/28/2015  CASSANDA DAVOLI 1946/02/07 [id number]  Debra Saunders is an 70 y.o. female  Subjective: "I have had a cold since Christmas and even though it's getting better I am still having a cough with some mucus" "I have not heard back from [place] or [employer] about my lighter portable 02 tank." "My daughter has had surgery and I do not have transportation to my doctor's appointments." I am on a medication named Milda Smart that a foundation was paying for but they are not going to pay for it anymore and it's 2,000$ per month."  Objective: Blood pressure 128/62, pulse 70, resp. rate 20, weight 223 lb (101.152 kg), SpO2 97 %on 3lpm of 02 via n/c.  Review of Systems  Constitutional: Negative for weight loss.  HENT: Positive for congestion.   Eyes: Positive for double vision.       Ongoing issue she has seen the eye doctor for.  Respiratory: Positive for cough, sputum production and shortness of breath.        SOB with exertion pt reports only slightly worse than her baseline. Reports thick green sputum with cough.   Cardiovascular: Positive for leg swelling.       Chronic left leg swelling.   Musculoskeletal: Negative for falls.  All other systems reviewed and are negative.   Physical Exam  Constitutional: She is oriented to person, place, and time. She appears well-developed and well-nourished.  Cardiovascular: Normal rate and regular rhythm.   Pulses:      Radial pulses are 2+ on the right side, and 2+ on the left side.       Dorsalis pedis pulses are 2+ on the right side, and 1+ on the left side.  Respiratory: Effort normal. She has wheezes in the right upper field and the right middle field.  SOB with exertion.   GI: Soft. Bowel sounds are normal.  Musculoskeletal: Normal range of motion. She exhibits edema.       Left lower leg: She exhibits edema.       Legs: Neurological: She is alert and oriented to person, place, and time.    Skin: Skin is warm and dry.  Psychiatric: She has a normal mood and affect. Her behavior is normal. Judgment and thought content normal.    Current Medications:   Current Outpatient Prescriptions  Medication Sig Dispense Refill  . acetaminophen (TYLENOL) 325 MG tablet Take 325-650 mg by mouth every 6 (six) hours as needed for mild pain.     [name] ambrisentan (LETAIRIS) 5 MG tablet Take 1 tablet (5 mg total) by mouth daily. 30 tablet 5  . cyclobenzaprine (FLEXERIL) 5 MG tablet Take 1 tablet (5 mg total) by mouth 3 (three) times daily as needed for muscle spasms. 90 tablet 2  . diazepam (VALIUM) 5 MG tablet Take 1 tablet (5 mg total) by mouth every 8 (eight) hours as needed for muscle spasms (and/or neck pain). (Patient not taking: Reported on 05/31/2015) 30 tablet 0  . gabapentin (NEURONTIN) 100 MG capsule Take 200 mg by mouth 3 (three) times daily.    [name] glipiZIDE (GLUCOTROL) 5 MG tablet Take 0.5 tablets (2.5 mg total) by mouth daily before breakfast. 60 tablet 3  . insulin detemir (LEVEMIR) 100 UNIT/ML injection Inject 0.2 mLs (20 Units total) into the skin at bedtime. 10 mL 5  . meclizine (ANTIVERT) 25 MG tablet Take 25 mg by mouth daily as needed for dizziness.    . Menthol,  Topical Analgesic, (BIOFREEZE EX) Apply 1 application topically as needed (for pain).     . naproxen (EC NAPROSYN) 500 MG EC tablet Take 500 mg by mouth 2 (two) times daily as needed (for pain).    . ondansetron (ZOFRAN) 8 MG tablet Take 1 tablet (8 mg total) by mouth every 8 (eight) hours as needed for nausea or vomiting. 30 tablet 2  . oxyCODONE-acetaminophen (PERCOCET/ROXICET) 5-325 MG tablet Take 1 tablet by mouth every 8 (eight) hours as needed for severe pain. 30 tablet 0  . polyethylene glycol (MIRALAX / GLYCOLAX) packet Take 17 g by mouth daily as needed for moderate constipation.    . potassium chloride SA (KLOR-CON M20) 20 MEQ tablet Take 1 tablet (20 mEq total) by mouth 2 (two) times daily. 60 tablet 6  .  promethazine (PHENERGAN) 25 MG tablet Take 25 mg by mouth every 4 (four) hours as needed for nausea or vomiting.     . torsemide (DEMADEX) 20 MG tablet Take 1 tablet (20 mg total) by mouth 2 (two) times daily. 60 tablet 6  . verapamil (CALAN-SR) 240 MG CR tablet Take 240 mg by mouth daily.     No current facility-administered medications for this visit.    Functional Status:   In your present state of health, do you have any difficulty performing the following activities: 05/31/2015 05/15/2015  Hearing? - N  Vision? - Y  Difficulty concentrating or making decisions? - N  Walking or climbing stairs? - Y  Dressing or bathing? - Y  Doing errands, shopping? - Y  Preparing Food and eating ? Y -  Using the Toilet? Y -  In the past six months, have you accidently leaked urine? Y -  Do you have problems with loss of bowel control? N -  Managing your Medications? Y -  Managing your Finances? Y -  Housekeeping or managing your Housekeeping? Y -    Fall/Depression Screening:    PHQ 2/9 Scores 05/31/2015 05/01/2015 04/12/2015 06/29/2014 10/06/2013 07/09/2012 04/28/2012  PHQ - 2 Score 0 0 0 0 0 0 0    Assessment:  Pt's pain and ROM remarkably better since orthopedic appointment. Pt concerned that her lung medication will be too expensive and she will be unable to afford it. Attempted to assist pt with calls to charity groups that assisted her last year but RNCM unable to complete the online app for [employer] charitable organization, pt given the phone number to attempt to complete the app. Also called the organization that assisted her last year [employer] but representative stated they were out of funds. RNCM sent order to pharmacy for further assistance and encouraged pt to call her pulmonologist.   Pt stating she had not received the small 02 concentrator, nor the shower chair she needed through home health. RNCM attempted to call [employer] to see where the claim was in the process but was transferred  to several different depts and the last dept the representative stated he did not know who I could speak with about this information and he did not know who to transfer me to. RNCM had already spent a long time on the phone during the visit so she let pt know she would call the MD office to make them aware [employer] had not received the information.   Pt also stating that transportation had become an issue since her daughter's surgery. RNCM made [facility] SW aware of pt's new transportation issue. RNCM also called [facility] to  assist pt with getting assistance with ADLS. Pt took the number so that she could schedule up a follow up appt with them.   Pt's lungs with wheezes noted to right upper lobes. VS wnl. Pt with swelling to left leg per her normal. No edema noted to right leg. Pt noted to have SOB on exertion but recorded weights were steady. Pt stated she was recovering from a cold and attributed SOB to this. Pt did report coughing up discolored mucus. Continues on 3lpm of 02 at 3lpm.   Pt's sugars within normal limits, pt continues on normal medications. Pt continues to me given insulin by her daughter. Pt bilateral feet with skin intact.  RNCM called MD office and made them aware of pt's lingering cold, the need for the smaller concentrator and the bath chair.   Plan: RNCM will see pt in one month.  Pharmacy order placed. [facility] SW made aware of transportation needs   Alysha Doolan RN, BSN  [facility] Care Management [phone])

## 2015-06-28 NOTE — Telephone Encounter (Signed)
Patient has productive cough with yellow mucus since Christmas day, Nurse from [facility] reported hearing wheezing and crackles, patient reported afebrile. Patient has no transportation her only means is a daughter whom has had surgery and cannot drive . Patient has been using robitussin DM which has helped, patient stated she feels she is getting better but just taking a long time. Patient stated that [facility] nor patient has received an order for a portable 02 concentrator and a shower chair. Patient has 02 in the home prescribed at 3L per min refillable tank and hard for patient to move around. Advised patient if she feels SOB to call 911 since she has no transportation.

## 2015-06-28 NOTE — Telephone Encounter (Signed)
She has chronic lung disease. If she is feeling short of breath with cough productive of mucous, she needs to be evaluated with chest xray asap. I would recommend ER evaluation today given potential for rapidly worsening symptoms with her chronic health conditions.

## 2015-06-28 NOTE — Telephone Encounter (Signed)
Janci Minor from [facility] called to follow up with small portable concentrator tank, and the order for the shower chair. She has nor received either. Merlene Morse wanted to report that patient has had a cold since 06/17/15. Sh cas a cough and has been coughing up green mucus, with slight shortness of breath. Merlene Morse stated that patients daughter recently had a hysterectomy and could not drive at this time, if an appt was need to treat symptoms. Her daughter is her only means of transportation.

## 2015-06-28 NOTE — Patient Outreach (Signed)
[facility]) Care Management  06/28/2015  Debra Saunders 02/07/1946 [id number]   Phone call to patient to follow up on referral to the [facility] for assistance with getting a wheelchair ramp built. Per patient, she received the paperwork and has completed and sent it back.  Patient has also had her Advanced Directive notarized and will take it to her doctor's office on her next visit to be uploaded to the chart.  Patient also given the phone number to [facility] transportation [phone] as a back up plan to get to her medical appointments if her family members are unable to take her.   Plan:  This Education officer, museum will follow up with patient in approximately 1 month to follow up on referral to the [place].   [name] [facility] Care Management [phone]

## 2015-06-28 NOTE — Telephone Encounter (Signed)
Patient stated she will follow directions of MD.

## 2015-07-02 ENCOUNTER — Telehealth: Payer: Self-pay | Admitting: Internal Medicine

## 2015-07-02 ENCOUNTER — Other Ambulatory Visit: Payer: Self-pay | Admitting: Pharmacist

## 2015-07-02 DIAGNOSIS — J449 Chronic obstructive pulmonary disease, unspecified: Secondary | ICD-10-CM | POA: Diagnosis not present

## 2015-07-02 DIAGNOSIS — G4733 Obstructive sleep apnea (adult) (pediatric): Secondary | ICD-10-CM | POA: Diagnosis not present

## 2015-07-02 DIAGNOSIS — I132 Hypertensive heart and chronic kidney disease with heart failure and with stage 5 chronic kidney disease, or end stage renal disease: Secondary | ICD-10-CM | POA: Diagnosis not present

## 2015-07-02 DIAGNOSIS — E1122 Type 2 diabetes mellitus with diabetic chronic kidney disease: Secondary | ICD-10-CM | POA: Diagnosis not present

## 2015-07-02 DIAGNOSIS — I5030 Unspecified diastolic (congestive) heart failure: Secondary | ICD-10-CM | POA: Diagnosis not present

## 2015-07-02 DIAGNOSIS — M1990 Unspecified osteoarthritis, unspecified site: Secondary | ICD-10-CM | POA: Diagnosis not present

## 2015-07-02 DIAGNOSIS — M5010 Cervical disc disorder with radiculopathy, unspecified cervical region: Secondary | ICD-10-CM | POA: Diagnosis not present

## 2015-07-02 DIAGNOSIS — N186 End stage renal disease: Secondary | ICD-10-CM | POA: Diagnosis not present

## 2015-07-02 DIAGNOSIS — J9611 Chronic respiratory failure with hypoxia: Secondary | ICD-10-CM | POA: Diagnosis not present

## 2015-07-02 DIAGNOSIS — I272 Pulmonary hypertension, unspecified: Secondary | ICD-10-CM

## 2015-07-02 NOTE — Patient Outreach (Signed)
Debra Saunders is a 70 y.o. female referred to pharmacy for medication assistance related to her Milda Smart for pulmonary hypertension. Patient reports that she has been receiving this medication through funding from a foundation called [employer]. However, reports that she has been notified that they are now out of funds and patient is unable to afford the approximately $2000/month for this medication. Reports that she has been getting it from [facility]. Patient states that she has about 2 weeks of the medication remaining. Reports that she has been calling the foundation every week to see if they have funding again.  Reports that she has not notified her Pulmonologist's office as she has not seen them since August. Counsel patient to call her Pulmonologist's office to let them know.  PLAN:  1) I will also reach out to the [place] office now to discuss options.  2) Will reach out to Care Management Assistant Damita Rhodie to ask her to help patient to evaluate the manufacturer patient assistance option for this medication.  Harlow Asa, PharmD Clinical Pharmacist [place] Management [phone]

## 2015-07-02 NOTE — Telephone Encounter (Signed)
Spoke with Benjamine Mola, pharmacist through [facility] that pt is unable to afford Lateris.  Pt was getting a grant through a third party foundation [employer]), but the grant has no money left to give.  Medication costs pt $2000/month.  Pt has about 2 weeks left of medication at this time.  Benjamine Mola states she's looked at alternative meds that her insurance would cover, but there is nothing noticeably cheaper for pt.  Benjamine Mola wants [place] to explore other grant options or alternative medications that might have more grant options.  Elizabeth requests that we update both the patient and herself on how we plan to proceed.   PM please advise on how you'd like to proceed with this.  Thanks!

## 2015-07-02 NOTE — Patient Outreach (Signed)
Called to reach out to the patient's Pulmonologist office to let them know that the patient no longer is able to afford her Milda Smart that she was receiving through [employer]. Left a message with Vida Roller in the office asking for a call back to discuss options for this patient including patient assistance. If have not heard back from the office by 05/03/16, will call again at that time.  Harlow Asa, PharmD Clinical Pharmacist [place] Management [phone]

## 2015-07-04 ENCOUNTER — Other Ambulatory Visit: Payer: Self-pay | Admitting: Pharmacist

## 2015-07-04 NOTE — Patient Outreach (Signed)
Called to follow up with Debra Saunders to determine if she had heard back from Dr. Mathis Fare office regarding her no longer being able to afford her Milda Smart that she was receiving through [employer]. Ms. Mikkelsen reports that she also called the pulmonologist's office on Monday, but that she has not yet received a call back.  Patient reports that she did receive a call from a representative from [facility] who helped her to complete an application for assistance through another foundation, [employer], online.   Let Ms. Gehrig know that I will reach out to her pulmonologist's office again today.  Harlow Asa, PharmD Clinical Pharmacist [place] Management [phone]

## 2015-07-04 NOTE — Telephone Encounter (Signed)
Dr. Ashby Dawes please advise. Thanks.

## 2015-07-04 NOTE — Patient Outreach (Signed)
Called to follow up with the patient's Pulmonologist office [phone]) regarding the message that I left on 07/02/15 about the patient no longer is able to afford her Milda Smart that she was receiving through [employer]. Left a message with Hiraa in the office asking for a call back to discuss options for this patient including patient assistance. If have not heard back from the office by 05/05/16, will call again at that time.  Harlow Asa, PharmD Clinical Pharmacist [place] Management [phone]

## 2015-07-04 NOTE — Telephone Encounter (Signed)
Pharmacist calling asking about below. Just doing a follow up for she has not heard anything yet. Please reach back with patient if we attempt to call tomorrow. If not then please call her back. Number is given.

## 2015-07-05 DIAGNOSIS — I132 Hypertensive heart and chronic kidney disease with heart failure and with stage 5 chronic kidney disease, or end stage renal disease: Secondary | ICD-10-CM | POA: Diagnosis not present

## 2015-07-05 DIAGNOSIS — J9611 Chronic respiratory failure with hypoxia: Secondary | ICD-10-CM | POA: Diagnosis not present

## 2015-07-05 DIAGNOSIS — E1122 Type 2 diabetes mellitus with diabetic chronic kidney disease: Secondary | ICD-10-CM | POA: Diagnosis not present

## 2015-07-05 DIAGNOSIS — J449 Chronic obstructive pulmonary disease, unspecified: Secondary | ICD-10-CM | POA: Diagnosis not present

## 2015-07-05 DIAGNOSIS — N186 End stage renal disease: Secondary | ICD-10-CM | POA: Diagnosis not present

## 2015-07-05 DIAGNOSIS — M5010 Cervical disc disorder with radiculopathy, unspecified cervical region: Secondary | ICD-10-CM | POA: Diagnosis not present

## 2015-07-05 DIAGNOSIS — G4733 Obstructive sleep apnea (adult) (pediatric): Secondary | ICD-10-CM | POA: Diagnosis not present

## 2015-07-05 DIAGNOSIS — M1990 Unspecified osteoarthritis, unspecified site: Secondary | ICD-10-CM | POA: Diagnosis not present

## 2015-07-05 DIAGNOSIS — I5030 Unspecified diastolic (congestive) heart failure: Secondary | ICD-10-CM | POA: Diagnosis not present

## 2015-07-05 NOTE — Telephone Encounter (Signed)
lmtcb for Debra Saunders at [facility] to make aware that we are waiting on provider's recommendations for this pt and how he would like to proceed.

## 2015-07-06 ENCOUNTER — Other Ambulatory Visit: Payer: Self-pay | Admitting: Pharmacist

## 2015-07-06 NOTE — Telephone Encounter (Signed)
Will send back to [facility] to follow up on

## 2015-07-06 NOTE — Patient Outreach (Signed)
Receive a call back from [facility] in Dr. Mathis Fare office. Per Misty, Dr. Ashby Dawes is going to switch the patient from Letairis to Opsumit another endothelin receptor antagonist for the treatment of her pulmonary arterial hypertension. Per [place], their office has worked with the patient assistance program for this medication in the past and will start this patient assistance process for this patient for the medication now. Reports that she will call Ms. Gladen now to discuss this change, the patient assistance and also to schedule a follow up appointment for the patient with Dr. Ashby Dawes to get up to date lab work and pulmonary function tests.  Will call to follow up with Ms. Mikrut prior to closing this pharmacy episode.   Harlow Asa, PharmD Clinical Pharmacist [place] Management [phone]

## 2015-07-06 NOTE — Patient Outreach (Signed)
Called to follow up with the patient's Pulmonologist office [phone]) regarding the message that I left on 07/02/15 and 07/04/15 about the patient no longer is able to afford her Milda Smart that she was receiving through [employer]. Again left a message with Hiraa in the office asking for a call back to discuss options for this patient including patient assistance.   Harlow Asa, PharmD Clinical Pharmacist [place] Management [phone]

## 2015-07-06 NOTE — Telephone Encounter (Signed)
lmtcb x2 for [place].

## 2015-07-06 NOTE — Telephone Encounter (Signed)
Spoke with Debra Saunders and informed Debra Saunders of DR response. Will call pt and speak with Debra Saunders to let Debra Saunders know and make sure she is ok with this.   Spoke with pt ands she agrees to try the medication. She agrees to the appts but ask that Debra Saunders daughter call back to schedule the appts since she brings Debra Saunders. Will start the paperwork for the Opsumit per DR. Will await for the daughter's call to schedule f/u.

## 2015-07-06 NOTE — Telephone Encounter (Signed)
Spoke with [facility] Saunders [facility]) - needing to know what to do with this. Pt is going to run out of medication very soon (about 1-2 weeks) and they need to know next steps since Letaris is no longer covered. Please advise ASAP. Thanks.

## 2015-07-06 NOTE — Telephone Encounter (Signed)
Will start Opsumit, will need a 6MWT, 2 view CXR and complete PFT prior to initiation, and follow up with me ASAP (after testing).

## 2015-07-09 ENCOUNTER — Other Ambulatory Visit: Payer: Self-pay | Admitting: Pharmacist

## 2015-07-09 ENCOUNTER — Encounter: Payer: Self-pay | Admitting: Internal Medicine

## 2015-07-09 ENCOUNTER — Ambulatory Visit (INDEPENDENT_AMBULATORY_CARE_PROVIDER_SITE_OTHER): Payer: Commercial Managed Care - HMO | Admitting: Internal Medicine

## 2015-07-09 ENCOUNTER — Emergency Department: Payer: Commercial Managed Care - HMO

## 2015-07-09 ENCOUNTER — Emergency Department
Admission: EM | Admit: 2015-07-09 | Discharge: 2015-07-09 | Disposition: A | Discharge status: Home / Self Care | Payer: Commercial Managed Care - HMO | Attending: Emergency Medicine | Admitting: Emergency Medicine

## 2015-07-09 ENCOUNTER — Encounter: Payer: Self-pay | Admitting: *Deleted

## 2015-07-09 VITALS — BP 161/78 | HR 68 | Temp 98.5°F | Wt 237.1 lb

## 2015-07-09 DIAGNOSIS — Z79899 Other long term (current) drug therapy: Secondary | ICD-10-CM | POA: Insufficient documentation

## 2015-07-09 DIAGNOSIS — R0602 Shortness of breath: Secondary | ICD-10-CM | POA: Diagnosis not present

## 2015-07-09 DIAGNOSIS — Z9981 Dependence on supplemental oxygen: Secondary | ICD-10-CM | POA: Insufficient documentation

## 2015-07-09 DIAGNOSIS — E119 Type 2 diabetes mellitus without complications: Secondary | ICD-10-CM | POA: Insufficient documentation

## 2015-07-09 DIAGNOSIS — J9621 Acute and chronic respiratory failure with hypoxia: Secondary | ICD-10-CM

## 2015-07-09 DIAGNOSIS — Z87891 Personal history of nicotine dependence: Secondary | ICD-10-CM | POA: Diagnosis not present

## 2015-07-09 DIAGNOSIS — I272 Other secondary pulmonary hypertension: Secondary | ICD-10-CM

## 2015-07-09 DIAGNOSIS — J441 Chronic obstructive pulmonary disease with (acute) exacerbation: Secondary | ICD-10-CM | POA: Insufficient documentation

## 2015-07-09 DIAGNOSIS — J40 Bronchitis, not specified as acute or chronic: Secondary | ICD-10-CM | POA: Diagnosis not present

## 2015-07-09 DIAGNOSIS — Z794 Long term (current) use of insulin: Secondary | ICD-10-CM | POA: Insufficient documentation

## 2015-07-09 DIAGNOSIS — R05 Cough: Secondary | ICD-10-CM | POA: Diagnosis not present

## 2015-07-09 DIAGNOSIS — Z880 Allergy status to penicillin: Secondary | ICD-10-CM | POA: Diagnosis not present

## 2015-07-09 DIAGNOSIS — I10 Essential (primary) hypertension: Secondary | ICD-10-CM | POA: Diagnosis not present

## 2015-07-09 LAB — BASIC METABOLIC PANEL
Anion gap: 6 (ref 5–15)
BUN: 11 mg/dL (ref 6–20)
CO2: 29 mmol/L (ref 22–32)
Calcium: 8.9 mg/dL (ref 8.9–10.3)
Chloride: 103 mmol/L (ref 101–111)
Creatinine, Ser: 0.64 mg/dL (ref 0.44–1.00)
GFR calc Af Amer: >60 mL/min (ref >60)
GFR calc non Af Amer: >60 mL/min (ref >60)
Glucose, Bld: 102 mg/dL — ABNORMAL HIGH (ref 65–99)
Potassium: 3.7 mmol/L (ref 3.5–5.1)
Sodium: 138 mmol/L (ref 135–145)

## 2015-07-09 LAB — CBC
HCT: 36.8 % (ref 35.0–47.0)
Hemoglobin: 11.6 g/dL — ABNORMAL LOW (ref 12.0–16.0)
MCH: 27.1 pg (ref 26.0–34.0)
MCHC: 31.6 g/dL — ABNORMAL LOW (ref 32.0–36.0)
MCV: 86 fL (ref 80.0–100.0)
Platelets: 227 10*3/uL (ref 150–440)
RBC: 4.28 MIL/uL (ref 3.80–5.20)
RDW: 13.8 % (ref 11.5–14.5)
WBC: 8 10*3/uL (ref 3.6–11.0)

## 2015-07-09 LAB — TROPONIN I: Troponin I: <0.03 ng/mL (ref <0.031)

## 2015-07-09 MED ORDER — AZITHROMYCIN 250 MG PO TABS
500.0000 mg | ORAL_TABLET | Freq: Once | ORAL | Status: AC
  Administered 2015-07-09: 500 mg via ORAL
  Filled 2015-07-09: qty 2

## 2015-07-09 MED ORDER — AZITHROMYCIN 250 MG PO TABS: ORAL_TABLET | ORAL | Status: AC

## 2015-07-09 MED ORDER — HYDROCOD POLST-CPM POLST ER 10-8 MG/5ML PO SUER: 5.0000 mL | Freq: Two times a day (BID) | ORAL | Status: DC | PRN

## 2015-07-09 MED ORDER — HYDROCOD POLST-CPM POLST ER 10-8 MG/5ML PO SUER
5.0000 mL | Freq: Once | ORAL | Status: AC
  Administered 2015-07-09: 5 mL via ORAL
  Filled 2015-07-09: qty 5

## 2015-07-09 NOTE — Patient Outreach (Addendum)
Called to follow up with [name]. [name] reports that she spoke with Debra Saunders in Debra Saunders office about being switched from Debra Saunders to Debra Saunders and that Debra Saunders has started the Debra Saunders for her for this new medication. Reports that she has not yet called to schedule the follow up appointment with Dr. Ashby Saunders. Counseled Debra to do this today. Debra reports that she will call when we get off of the phone.  Debra reports that she has no further questions for me, but has my phone number. Will close pharmacy episode for now.   Debra Saunders, PharmD Clinical Pharmacist [place] Management [phone]

## 2015-07-09 NOTE — Patient Instructions (Signed)
To ER

## 2015-07-09 NOTE — Assessment & Plan Note (Signed)
Acute on chronic respiratory failure likely triggered 2 weeks ago with viral URI. No suspect right pneumonia. She is tachypneic and using accessory muscles of respiration. Recommended urgent evaluation with CXR, labs. She will go to ER for evaluation. Called ER with information. She declined EMS.

## 2015-07-09 NOTE — Progress Notes (Signed)
Pre visit review using our clinic review tool, if applicable. No additional management support is needed unless otherwise documented below in the visit note. 

## 2015-07-09 NOTE — Discharge Instructions (Signed)
Upper Respiratory Infection, Adult Most upper respiratory infections (URIs) are caused by a virus. A URI affects the nose, throat, and upper air passages. The most common type of URI is often called "the common cold." HOME CARE   Take medicines only as told by your doctor.  Gargle warm saltwater or take cough drops to comfort your throat as told by your doctor.  Use a warm mist humidifier or inhale steam from a shower to increase air moisture. This may make it easier to breathe.  Drink enough fluid to keep your pee (urine) clear or pale yellow.  Eat soups and other clear broths.  Have a healthy diet.  Rest as needed.  Go back to work when your fever is gone or your doctor says it is okay.  You may need to stay home longer to avoid giving your URI to others.  You can also wear a face mask and wash your hands often to prevent spread of the virus.  Use your inhaler more if you have asthma.  Do not use any tobacco products, including cigarettes, chewing tobacco, or electronic cigarettes. If you need help quitting, ask your doctor. GET HELP IF:  You are getting worse, not better.  Your symptoms are not helped by medicine.  You have chills.  You are getting more short of breath.  You have brown or red mucus.  You have yellow or brown discharge from your nose.  You have pain in your face, especially when you bend forward.  You have a fever.  You have puffy (swollen) neck glands.  You have pain while swallowing.  You have white areas in the back of your throat. GET HELP RIGHT AWAY IF:   You have very bad or constant:  Headache.  Ear pain.  Pain in your forehead, behind your eyes, and over your cheekbones (sinus pain).  Chest pain.  You have long-lasting (chronic) lung disease and any of the following:  Wheezing.  Long-lasting cough.  Coughing up blood.  A change in your usual mucus.  You have a stiff neck.  You have changes in  your:  Vision.  Hearing.  Thinking.  Mood. MAKE SURE YOU:   Understand these instructions.  Will watch your condition.  Will get help right away if you are not doing well or get worse.   This information is not intended to replace advice given to you by your health care provider. Make sure you discuss any questions you have with your health care provider.   Document Released: 11/26/2007 Document Revised: 10/24/2014 Document Reviewed: 09/14/2013 Elsevier Interactive Patient Education 2016 [employer].  

## 2015-07-09 NOTE — ED Provider Notes (Signed)
[facility] Emergency Department Provider Note  Time seen: 7:47 PM  I have reviewed the triage vital signs and the nursing notes.   HISTORY  Chief Complaint Shortness of Breath    HPI Debra Saunders is a 70 y.o. female with a past medical history of diabetes, hypertension, hyperlipidemia, CHF, COPD, CVA, who presents the emergency department with shortness of breath cough and congestion. According to the patient for the past 3 weeks she has had a cough for the past 4 days she has felt short of breath with increased cough with sputum production. States occasional central chest pain but mostly with cough. Denies any currently. Denies any nausea or diaphoresis.     Past Medical History  Diagnosis Date  . Thyroid disorder   . Diabetes mellitus ([place])     type II  . Thyroid nodule   . Sleep apnea   . Obesity   . Hypertension   . Hyperlipidemia   . Urinary incontinence   . CHF (congestive heart failure) ([place]) 2005    Dx at [facility]  . Chronic back pain   . COPD (chronic obstructive pulmonary disease) ([place])     2L [place]  . Sleep apnea   . Lipoma of back   . Arthritis   . Streptococcal bacteremia   . Acute renal failure ([place])   . Stroke ([place])   . Sepsis with metabolic encephalopathy ([place])   . Gastritis   . Symptomatic bradycardia   . Chronic respiratory failure (HCC)     a. on home O2    Patient Active Problem List   Diagnosis Date Noted  . Diplopia 05/30/2015  . Left shoulder pain 05/30/2015  . Thyroid nodule 04/25/2015  . Weakness 03/31/2015  . Cervical disc disorder with radiculopathy of cervical region 03/30/2015  . Elevated troponin 03/30/2015  . Chest pain 03/27/2015  . Costochondritis 07/04/2014  . Elevated LFTs 06/29/2014  . Acute on chronic respiratory failure ([place]) 06/22/2014  . 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 Abnormality of gait 01/13/2014  . Sinoatrial node dysfunction ([place]) 01/09/2014  . Allergic rhinitis 10/26/2013   . Lymphedema 08/03/2013  . Constipation 04/12/2013  . Pulmonary embolus ([place]) 01/19/2013  . Subarachnoid hemorrhage ([place]) 01/19/2013  . Musculoskeletal chest pain 01/19/2013  . Convulsions/seizures ([place]) 01/19/2013  . Other convulsions 01/19/2013  . Preop cardiovascular exam 10/19/2012  . Anemia 04/28/2012  . Chronic respiratory failure with hypoxia ([name]) 04/26/2012  . Diabetes mellitus type 2, controlled ([place]) 03/30/2012  . Pulmonary hypertension ([place]) 03/30/2012  . Hyperlipidemia 03/30/2012  . Screening for breast cancer 03/30/2012  . Hypothyroidism 03/30/2012  . Chronic diastolic heart failure ([place]) 01/22/2012  . Hypertension     Past Surgical History  Procedure Laterality Date  . Cholecystectomy    . Vaginal hysterectomy    . Cardiac catheterization  2013    @ [place]: No obstructive CAD: Only 20% ostial left circumflex.  . Abdominal hysterectomy  1969  . Cyst removal trunk  2002    BACK  . Lipoma removal  2014    back  . Pacemaker insertion  2015    [place] dual chamber pacemaker implanted by Dr Caryl Comes for symptomatic bradycardia  . Insert / replace / remove pacemaker    . Permanent pacemaker insertion N/A 01/09/2014    Procedure: PERMANENT PACEMAKER INSERTION;  Surgeon: Deboraha Sprang, MD;  Location: [facility] CATH LAB;  Service: Cardiovascular;  Laterality: N/A;  . Trigger finger release  Current Outpatient Rx  Name  Route  Sig  Dispense  Refill  . acetaminophen (TYLENOL) 325 MG tablet   Oral   Take 325-650 mg by mouth every 6 (six) hours as needed for mild pain.          [name] ambrisentan (LETAIRIS) 5 MG tablet   Oral   Take 1 tablet (5 mg total) by mouth daily.   30 tablet   5   . cyclobenzaprine (FLEXERIL) 5 MG tablet   Oral   Take 1 tablet (5 mg total) by mouth 3 (three) times daily as needed for muscle spasms.   90 tablet   2   . diazepam (VALIUM) 5 MG tablet   Oral   Take 1 tablet (5 mg total) by mouth every 8 (eight) hours as needed for muscle  spasms (and/or neck pain).   30 tablet   0   . gabapentin (NEURONTIN) 100 MG capsule   Oral   Take 200 mg by mouth 3 (three) times daily.         [name] glipiZIDE (GLUCOTROL) 5 MG tablet   Oral   Take 0.5 tablets (2.5 mg total) by mouth daily before breakfast.   60 tablet   3   . insulin detemir (LEVEMIR) 100 UNIT/ML injection   Subcutaneous   Inject 0.2 mLs (20 Units total) into the skin at bedtime.   10 mL   5   . meclizine (ANTIVERT) 25 MG tablet   Oral   Take 25 mg by mouth daily as needed for dizziness.         . Menthol, Topical Analgesic, (BIOFREEZE EX)   Topical   Apply 1 application topically as needed (for pain).          . naproxen (EC NAPROSYN) 500 MG EC tablet   Oral   Take 500 mg by mouth 2 (two) times daily as needed (for pain).         . ondansetron (ZOFRAN) 8 MG tablet   Oral   Take 1 tablet (8 mg total) by mouth every 8 (eight) hours as needed for nausea or vomiting.   30 tablet   2   . oxyCODONE-acetaminophen (PERCOCET/ROXICET) 5-325 MG tablet   Oral   Take 1 tablet by mouth every 8 (eight) hours as needed for severe pain.   30 tablet   0   . polyethylene glycol (MIRALAX / GLYCOLAX) packet   Oral   Take 17 g by mouth daily as needed for moderate constipation.         . potassium chloride SA (KLOR-CON M20) 20 MEQ tablet   Oral   Take 1 tablet (20 mEq total) by mouth 2 (two) times daily.   60 tablet   6   . promethazine (PHENERGAN) 25 MG tablet   Oral   Take 25 mg by mouth every 4 (four) hours as needed for nausea or vomiting.          . torsemide (DEMADEX) 20 MG tablet   Oral   Take 1 tablet (20 mg total) by mouth 2 (two) times daily.   60 tablet   6   . verapamil (CALAN-SR) 240 MG CR tablet   Oral   Take 240 mg by mouth daily.           Allergies Penicillins; Aspirin; and Other  Family History  Problem Relation Age of Onset  . Heart disease Mother   . Hypertension Mother   . COPD Mother  was a smoker  .  Thyroid disease Mother   . Hypertension Father   . Hypertension Sister   . Thyroid disease Sister   . Hypertension Sister   . Thyroid disease Sister   . Breast cancer Maternal Aunt     Social History Social History  Substance Use Topics  . Smoking status: Former Smoker -- 0.30 packs/day for 2 years    Types: Cigarettes    Quit date: 06/23/1990  . Smokeless tobacco: Never Used  . Alcohol Use: No    Review of Systems Constitutional: Negative for fever. Cardiovascular: Intermittent chest pain with cough. Respiratory: Positive for shortness of breath, with cough and sputum production for the past 4 days. Gastrointestinal: Negative for abdominal pain Neurological: Negative for headache 10-point ROS otherwise negative.  ____________________________________________   PHYSICAL EXAM:  VITAL SIGNS: ED Triage Vitals  Enc Vitals Group     BP 07/09/15 1649 172/88 mmHg     Pulse Rate 07/09/15 1649 60     Resp 07/09/15 1649 20     Temp 07/09/15 1649 98.4 F (36.9 C)     Temp Source 07/09/15 1649 Oral     SpO2 07/09/15 1649 98 %     Weight 07/09/15 1649 237 lb (107.502 kg)     Height 07/09/15 1649 5' (1.524 m)     Head Cir --      Peak Flow --      Pain Score 07/09/15 1650 0     Pain Loc --      Pain Edu? --      Excl. in [place]? --     Constitutional: Alert and oriented. Well appearing and in no distress. Eyes: Normal exam ENT   Head: Normocephalic and atraumatic.   Mouth/Throat: Mucous membranes are moist. Cardiovascular: Normal rate, regular rhythm. No murmur Respiratory: Normal respiratory effort without tachypnea nor retractions. Breath sounds are clear and equal bilaterally.  Gastrointestinal: Soft and nontender. No distention.  Musculoskeletal: Nontender with normal range of motion in all extremities. Neurologic:  Normal speech and language. No gross focal neurologic deficits  Skin:  Skin is warm, dry and intact.  Psychiatric: Mood and affect are normal. Speech  and behavior are normal.   ____________________________________________    EKG  EKG reviewed and interpreted by myself shows normal sinus rhythm at 72 bpm, narrow QRS, normal axis, normal intervals, nonspecific ST changes. No ST elevations.  ____________________________________________    RADIOLOGY  Chest x-ray shows chronic changes. No acute process.   INITIAL IMPRESSION / ASSESSMENT AND PLAN / ED COURSE  Pertinent labs & imaging results that were available during my care of the patient were reviewed by me and considered in my medical decision making (see chart for details).  Patient presents the emergency department with cough for the past 3 weeks, worse now shortness of breath and mucus production over the past 4 days. Overall the patient appears very well. Normal oxygen saturation on her normal 3 L by nasal cannula. Denies any chest pain currently. Labs are within normal limits including negative troponin. Chest x-ray shows no acute abnormality. Given the patient's increased cough with mucus production and increased shortness of breath we will cover for acute bronchitis and prescribed Zithromax as well as a medication to help with her cough at night. Patient is agreeable to this plan and will follow up with her primary care physician this week.  ____________________________________________   FINAL CLINICAL IMPRESSION(S) / ED DIAGNOSES  Bronchitis   Harvest Dark, MD 07/09/15 1958

## 2015-07-09 NOTE — Progress Notes (Signed)
Subjective:    Patient ID: Debra Saunders, female    DOB: [birth year], 70 y.o.   MRN: [id number]  HPI  70YO female with chronic respiratory failure, pulmonary hypertension, CAD presents for follow up.  About 2 weeks ago, had fever, cough. Was seen by home health nurse. Treated as viral infection and symptoms improved for about 1 week. Then,last week had increased cough and dyspnea. Had severe trouble breathing on Friday. Describes having to brace herself to breathe. Short of breath even speaking short sentences. She did not seek care for this. Took cough drops at home. Yesterday felt slightly better. However symptoms of dyspnea so bad last night, had to use CPAP to breathe. Feels short of breath today. Oxygen set at 3L. Short of breath speaking short sentences. Has not used Albuterol inhaler. Coughing up thick yellow brown mucous. No fever.   Wt Readings from Last 3 Encounters:  07/09/15 237 lb 2 oz (107.559 kg)  06/28/15 223 lb (101.152 kg)  05/31/15 230 lb (104.327 kg)   BP Readings from Last 3 Encounters:  07/09/15 161/78  06/28/15 128/62  05/31/15 138/72    Past Medical History  Diagnosis Date  . Thyroid disorder   . Diabetes mellitus ([place])     type II  . Thyroid nodule   . Sleep apnea   . Obesity   . Hypertension   . Hyperlipidemia   . Urinary incontinence   . CHF (congestive heart failure) ([place]) 2005    Dx at [facility]  . Chronic back pain   . COPD (chronic obstructive pulmonary disease) ([place])     2L [place]  . Sleep apnea   . Lipoma of back   . Arthritis   . Streptococcal bacteremia   . Acute renal failure ([place])   . Stroke ([place])   . Sepsis with metabolic encephalopathy ([place])   . Gastritis   . Symptomatic bradycardia   . Chronic respiratory failure (HCC)     a. on home O2   Family History  Problem Relation Age of Onset  . Heart disease Mother   . Hypertension Mother   . COPD Mother     was a smoker  . Thyroid disease Mother   . Hypertension Father   .  Hypertension Sister   . Thyroid disease Sister   . Hypertension Sister   . Thyroid disease Sister   . Breast cancer Maternal Aunt    Past Surgical History  Procedure Laterality Date  . Cholecystectomy    . Vaginal hysterectomy    . Cardiac catheterization  2013    @ [place]: No obstructive CAD: Only 20% ostial left circumflex.  . Abdominal hysterectomy  1969  . Cyst removal trunk  2002    BACK  . Lipoma removal  2014    back  . Pacemaker insertion  2015    [place] dual chamber pacemaker implanted by Dr Caryl Comes for symptomatic bradycardia  . Insert / replace / remove pacemaker    . Permanent pacemaker insertion N/A 01/09/2014    Procedure: PERMANENT PACEMAKER INSERTION;  Surgeon: Deboraha Sprang, MD;  Location: [facility] CATH LAB;  Service: Cardiovascular;  Laterality: N/A;  . Trigger finger release     Social History   Social History  . Marital Status: Divorced    Spouse Name: N/A  . Number of Children: N/A  . Years of Education: N/A   Occupational History  . Retired- factory work     exposed to [employer]  Social History Main Topics  . Smoking status: Former Smoker -- 0.30 packs/day for 2 years    Types: Cigarettes    Quit date: 06/23/1990  . Smokeless tobacco: Never Used  . Alcohol Use: No  . Drug Use: No  . Sexual Activity: Not Asked   Other Topics Concern  . None   Social History Narrative   Lives in [place] with daughter. Has 4 children. No pets.      Work - Restaurant manager, fast food, retired      Diet - regular    Review of Systems  Constitutional: Positive for activity change and fatigue. Negative for fever, chills and unexpected weight change.  HENT: Negative for congestion, ear discharge, ear pain, facial swelling, hearing loss, mouth sores, nosebleeds, postnasal drip, rhinorrhea, sinus pressure, sneezing, sore throat, tinnitus, trouble swallowing and voice change.   Eyes: Negative for pain, discharge, redness and visual disturbance.  Respiratory: Positive for  cough, chest tightness, shortness of breath and wheezing. Negative for stridor.   Cardiovascular: Negative for chest pain, palpitations and leg swelling.  Gastrointestinal: Negative for nausea, vomiting, diarrhea and constipation.  Musculoskeletal: Negative for myalgias, arthralgias, neck pain and neck stiffness.  Skin: Negative for color change and rash.  Neurological: Positive for weakness. Negative for dizziness, light-headedness and headaches.  Hematological: Negative for adenopathy.  Psychiatric/Behavioral: Positive for sleep disturbance. Negative for dysphoric mood. The patient is nervous/anxious.        Objective:    BP 161/78 mmHg  Pulse 68  Temp(Src) 98.5 F (36.9 C) (Oral)  Wt 237 lb 2 oz (107.559 kg)  SpO2 97% RR 30 Physical Exam  Constitutional: She is oriented to person, place, and time. She appears well-developed and well-nourished. She appears ill. No distress.  HENT:  Head: Normocephalic and atraumatic.  Right Ear: External ear normal.  Left Ear: External ear normal.  Nose: Nose normal.  Mouth/Throat: Oropharynx is clear and moist. No oropharyngeal exudate.  Eyes: Conjunctivae are normal. Pupils are equal, round, and reactive to light. Right eye exhibits no discharge. Left eye exhibits no discharge. No scleral icterus.  Neck: Normal range of motion. Neck supple. No tracheal deviation present. No thyromegaly present.  Cardiovascular: Normal rate, regular rhythm, normal heart sounds and intact distal pulses.  Exam reveals no gallop and no friction rub.   No murmur heard. Pulmonary/Chest: Accessory muscle usage present. Tachypnea noted. She is in respiratory distress. She has no decreased breath sounds. She has no wheezes. She has rhonchi (scattered right lung). She has no rales. She exhibits no tenderness.  Musculoskeletal: Normal range of motion. She exhibits no edema or tenderness.  Lymphadenopathy:    She has no cervical adenopathy.  Neurological: She is alert and  oriented to person, place, and time. No cranial nerve deficit. She exhibits normal muscle tone. Coordination normal.  Skin: Skin is warm and dry. No rash noted. She is not diaphoretic. No erythema. No pallor.  Psychiatric: She has a normal mood and affect. Her behavior is normal. Judgment and thought content normal.          Assessment & Plan:  Over 52min spent with patient and her daughter discussing plan of care and further evaluation.  Problem List Items Addressed This Visit      Unprioritized   Acute on chronic respiratory failure ([place]) - Primary    Acute on chronic respiratory failure likely triggered 2 weeks ago with viral URI. No suspect right pneumonia. She is tachypneic and using accessory muscles of respiration. Recommended urgent  evaluation with CXR, labs. She will go to ER for evaluation. Called ER with information. She declined EMS.      Pulmonary hypertension ([place])       Return if symptoms worsen or fail to improve.

## 2015-07-09 NOTE — ED Notes (Signed)
States SOB since Saturday, sent from her PCP, pt on home 02 3L, pt speaking in full clear sentances, states yellow productive cough

## 2015-07-09 NOTE — Telephone Encounter (Signed)
ATC pt's daughter on her cell number to arrange appointments. No answer, LMOAM for daughter to contact me at [phone] to arrange appointments ASAP. Rhonda J Cobb

## 2015-07-09 NOTE — Patient Outreach (Signed)
Call to follow up with Debra Saunders to make sure that she did not have any further questions.Left a HIPAA compliant message on the patient's voicemail. If have not heard from patient by next week, will give her another call at that time.    Harlow Asa, PharmD Clinical Pharmacist [facility] [phone]

## 2015-07-10 DIAGNOSIS — G4733 Obstructive sleep apnea (adult) (pediatric): Secondary | ICD-10-CM | POA: Diagnosis not present

## 2015-07-10 DIAGNOSIS — M1990 Unspecified osteoarthritis, unspecified site: Secondary | ICD-10-CM | POA: Diagnosis not present

## 2015-07-10 DIAGNOSIS — I132 Hypertensive heart and chronic kidney disease with heart failure and with stage 5 chronic kidney disease, or end stage renal disease: Secondary | ICD-10-CM | POA: Diagnosis not present

## 2015-07-10 DIAGNOSIS — E1122 Type 2 diabetes mellitus with diabetic chronic kidney disease: Secondary | ICD-10-CM | POA: Diagnosis not present

## 2015-07-10 DIAGNOSIS — J9611 Chronic respiratory failure with hypoxia: Secondary | ICD-10-CM | POA: Diagnosis not present

## 2015-07-10 DIAGNOSIS — I5030 Unspecified diastolic (congestive) heart failure: Secondary | ICD-10-CM | POA: Diagnosis not present

## 2015-07-10 DIAGNOSIS — N186 End stage renal disease: Secondary | ICD-10-CM | POA: Diagnosis not present

## 2015-07-10 DIAGNOSIS — J449 Chronic obstructive pulmonary disease, unspecified: Secondary | ICD-10-CM | POA: Diagnosis not present

## 2015-07-10 DIAGNOSIS — M5010 Cervical disc disorder with radiculopathy, unspecified cervical region: Secondary | ICD-10-CM | POA: Diagnosis not present

## 2015-07-10 NOTE — Telephone Encounter (Signed)
Called and spoke with pt's daughter this am.  Daughter stated that pt was at hospital all day yesterday. Asked if I could call back around 10:00 am.  Advised daughter that I would call her back as we need to get this appointment and tests scheduled ASAP. Daughter voiced understanding. Rhonda J Cobb

## 2015-07-10 NOTE — Telephone Encounter (Signed)
Contacted pt's daughter and SMW, PFT, CXR to be done on 07/13/15 at 3:00 pm to arrive at 2:45 at [facility]. ROV with Dr. Ashby Dawes scheduled for 07/16/15 at 3:00. Daughter is aware and SMW and PFT instructions mailed to pt's daughter along with appointment card. Daughter voiced understanding and nothing else needed at this time. Rhonda J Cobb

## 2015-07-10 NOTE — Addendum Note (Signed)
Addended by: Oscar La R on: 07/10/2015 01:01 PM   Modules accepted: Orders

## 2015-07-12 DIAGNOSIS — I132 Hypertensive heart and chronic kidney disease with heart failure and with stage 5 chronic kidney disease, or end stage renal disease: Secondary | ICD-10-CM | POA: Diagnosis not present

## 2015-07-12 DIAGNOSIS — I5030 Unspecified diastolic (congestive) heart failure: Secondary | ICD-10-CM | POA: Diagnosis not present

## 2015-07-12 DIAGNOSIS — G4733 Obstructive sleep apnea (adult) (pediatric): Secondary | ICD-10-CM | POA: Diagnosis not present

## 2015-07-12 DIAGNOSIS — J9611 Chronic respiratory failure with hypoxia: Secondary | ICD-10-CM | POA: Diagnosis not present

## 2015-07-12 DIAGNOSIS — J449 Chronic obstructive pulmonary disease, unspecified: Secondary | ICD-10-CM | POA: Diagnosis not present

## 2015-07-12 DIAGNOSIS — M1990 Unspecified osteoarthritis, unspecified site: Secondary | ICD-10-CM | POA: Diagnosis not present

## 2015-07-12 DIAGNOSIS — N186 End stage renal disease: Secondary | ICD-10-CM | POA: Diagnosis not present

## 2015-07-12 DIAGNOSIS — M5010 Cervical disc disorder with radiculopathy, unspecified cervical region: Secondary | ICD-10-CM | POA: Diagnosis not present

## 2015-07-12 DIAGNOSIS — E1122 Type 2 diabetes mellitus with diabetic chronic kidney disease: Secondary | ICD-10-CM | POA: Diagnosis not present

## 2015-07-13 ENCOUNTER — Ambulatory Visit (INDEPENDENT_AMBULATORY_CARE_PROVIDER_SITE_OTHER): Payer: Commercial Managed Care - HMO | Admitting: *Deleted

## 2015-07-13 ENCOUNTER — Ambulatory Visit (INDEPENDENT_AMBULATORY_CARE_PROVIDER_SITE_OTHER): Payer: Commercial Managed Care - HMO | Admitting: Internal Medicine

## 2015-07-13 DIAGNOSIS — I272 Other secondary pulmonary hypertension: Secondary | ICD-10-CM

## 2015-07-13 LAB — PULMONARY FUNCTION TEST
DL/VA % pred: 79 %
DL/VA: 3.48 ml/min/mmHg/L
DLCO unc % pred: 39 %
DLCO unc: 8.07 ml/min/mmHg
FEF 25-75 Post: 1.77 L/sec
FEF 25-75 Pre: 1.14 L/sec
FEF2575-%Change-Post: 55 %
FEF2575-%Pred-Post: 125 %
FEF2575-%Pred-Pre: 80 %
FEV1-%Change-Post: 13 %
FEV1-%Pred-Post: 73 %
FEV1-%Pred-Pre: 64 %
FEV1-Post: 1.12 L
FEV1-Pre: 0.98 L
FEV1FVC-%Change-Post: 7 %
FEV1FVC-%Pred-Pre: 106 %
FEV6-%Change-Post: 6 %
FEV6-%Pred-Post: 67 %
FEV6-%Pred-Pre: 63 %
FEV6-Post: 1.27 L
FEV6-Pre: 1.2 L
FEV6FVC-%Pred-Post: 105 %
FEV6FVC-%Pred-Pre: 105 %
FVC-%Change-Post: 6 %
FVC-%Pred-Post: 64 %
FVC-%Pred-Pre: 60 %
FVC-Post: 1.27 L
FVC-Pre: 1.2 L
Post FEV1/FVC ratio: 88 %
Post FEV6/FVC ratio: 100 %
Pre FEV1/FVC ratio: 82 %
Pre FEV6/FVC Ratio: 100 %

## 2015-07-13 NOTE — Progress Notes (Signed)
SMW performed today. 

## 2015-07-13 NOTE — Progress Notes (Signed)
PFT performed today with nitrogen washout. 

## 2015-07-16 ENCOUNTER — Encounter: Payer: Self-pay | Admitting: Internal Medicine

## 2015-07-16 ENCOUNTER — Ambulatory Visit (INDEPENDENT_AMBULATORY_CARE_PROVIDER_SITE_OTHER): Payer: Commercial Managed Care - HMO | Admitting: Internal Medicine

## 2015-07-16 VITALS — BP 140/74 | HR 71 | Ht 61.0 in | Wt 236.6 lb

## 2015-07-16 DIAGNOSIS — J9611 Chronic respiratory failure with hypoxia: Secondary | ICD-10-CM

## 2015-07-16 MED ORDER — ALBUTEROL SULFATE (2.5 MG/3ML) 0.083% IN NEBU: 2.5000 mg | INHALATION_SOLUTION | Freq: Four times a day (QID) | RESPIRATORY_TRACT | Status: DC | PRN

## 2015-07-16 NOTE — Progress Notes (Signed)
* [facility]    Assessment and Plan:  Pulmonary hypertension --Currently on letairis,  Her insurance is no longer covering this, and we are in the process of transitioning her to [facility].  --2004 [facility]: right heart catheterization from 10/25/02 revealed a PA pressure of 62/25 (mean 36) and a mean PA pressure decreased remarkably to 20 during this catheterization. A PCWP was normal at 6 and a cardiac index of 2.42 L/m/m2 with an initial increase in vasodilator therapy to 2.71 L/m/m2 -The cause of this is multifactorial due to CHF, thromboembolic disease, primary pulmonary disease.    Pulmonary embolus -Status post IVC filter. 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 Will not start anticoagulation at this time , she has a history of bleeding with  aspirin.  OSA (obstructive sleep apnea) --04/2012 CPAP titration study 13cm of H20 ideal -- continue current CPAP settings.  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 --Oxygen 3 L continuously  Restrictive Lung disease.  --Suspected due to sarcoidosis , as well as obesity..    Chronic diastolic heart failure -Follows with Dr. Fletcher Anon.  This is likely contributing to her chronic dyspnea.   social.  - discussed end-of-life issues, including CODE STATUS today In the presence of her daughter.She maintains that she would like to be kept alive as long as possible , though she did not think that she will want to be maintained on machines for prolonged periods of time. I did discuss with her today that should she end up on the ventilator , it may be difficult to wean her off as she has very advanced cardiopulmonary disease. Her functional status has declined significantly over the last 3 years , and I informed her that I expect her status to continue to decline a similar way over the next few months and years. I  explained that her conditions are irreversible and  her medical therapies are aimed at trying to slow the progress of her disease.  Date: 07/16/2015  MRN# [id number] Debra Saunders [birth year]   CC:  Chief Complaint  Patient presents with  . Follow-up    PFT & SMW results. pt. states breathing has wrosen. increased SOB. wheezing. chest tightness.     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 She was admitted to [facility] in September after she developed left arm weakness after taking a baby aspirin.  She was noted to have a brain bleed with aspirin in the past, she can no longer be on blood thinners.    since her last visit, it was noted that her [employer] was no longer covered, therefore she was being transitioned to Opsumit.  However she was informed over the weekend that [facility] will now cover Letairis. She has been tried on inhalers in the past which have not really help. She was on xopenex nebuluizers in the past which helped a bit. She felt it was too strong, then tried albuterol and seemed to work better.   Her breathing has been ok, she feels it has progressed since her last visit, she is just getting over bronchitis. Has been using cpap nightly.   Pt was ambulated 300 feet in clinic; initial sat on 3L was 96%, HR was 75, after walking at a slow pace, sat was 94% with HR 95.  Review of old testing:   PFT 07/13/2015 : FVC was 1.2 L 60% of predicted, FEV1 was 0.98 L, 64% of predicted, FEV to FVC was 82%. There was no significant improvement with bronchodilator. Residual volume was 44% of predicted, TLC was 49% of predicted. Diffusion capacity was 39% of predicted. Overall this test is consistent with restrictive lung disease. 2004 [facility]: right heart catheterization from 10/25/02 revealed a PA pressure of 62/25 (mean 36) and a mean PA pressure decreased remarkably  to 20 during this catheterization. A PCWP was normal at 6 and a cardiac index of 2.42 L/m/m2 with an initial increase in vasodilator therapy to 2.71 L/m/m2 2004 6MW 313 meters (1,027 feet) On oxygen in 2004 04/22/2012 6 minute walk>> total distance walked 950 feet, heart rate ranged between 56 and 88, O2 saturation ranged between 91 and 98% on 2 L nasal cannula 11-26 Spirometry >> no obstruction, FEV1 1.42 L (70%) 11-26 6MW >> 795 Ft, O2 saturation 90% lowest on 2L 07/2012 6MW >> 1115 feet, O2 saturation 97% 2LNC 03/2014 CT chest > Upper lobe and perihilar predominant ground glass and reticular coarsening and traction bronchiectasis and architectural distortion suggesting postinflammatory fibrosis. Sarcoid considered considering hilar lymph nodes, enlarged pulmonary arteries indicative of pulmonary arterial hypertension  Medication:   Current Outpatient Rx  Name  Route  Sig  Dispense  Refill  . acetaminophen (TYLENOL) 325 MG tablet   Oral   Take 325-650 mg by mouth every 6 (six) hours as needed for mild pain.          [name] ambrisentan (LETAIRIS) 5 MG tablet   Oral   Take 1 tablet (5 mg total) by mouth daily.   30 tablet   5   . chlorpheniramine-HYDROcodone (TUSSIONEX PENNKINETIC ER) 10-8 MG/5ML SUER   Oral   Take 5 mLs by mouth every 12 (twelve) hours as needed for cough.   115 mL   0   . cyclobenzaprine (FLEXERIL) 5 MG tablet   Oral   Take 1 tablet (5 mg total) by mouth 3 (three) times daily as needed for muscle spasms.   90 tablet   2   . diazepam (VALIUM) 5 MG tablet   Oral   Take 1 tablet (5 mg total) by mouth every 8 (eight) hours as needed for muscle spasms (and/or neck pain).   30 tablet   0   . gabapentin (NEURONTIN) 100 MG capsule   Oral   Take 200 mg by mouth 3 (three) times daily.         [name] glipiZIDE (GLUCOTROL) 5 MG tablet   Oral   Take 0.5 tablets (2.5 mg total) by mouth daily before breakfast.   60 tablet   3   . insulin detemir (LEVEMIR) 100 UNIT/ML  injection   Subcutaneous   Inject 0.2 mLs (20 Units total) into the skin at bedtime.   10 mL   5   . meclizine (ANTIVERT) 25 MG tablet   Oral   Take 25 mg by mouth daily as needed for dizziness.         . Menthol, Topical Analgesic, (BIOFREEZE EX)   Topical   Apply 1 application topically as needed (for pain).          . naproxen (EC NAPROSYN) 500 MG EC tablet   Oral   Take 500 mg by mouth 2 (two) times daily as needed (for pain).         . ondansetron (ZOFRAN) 8 MG tablet  Oral   Take 1 tablet (8 mg total) by mouth every 8 (eight) hours as needed for nausea or vomiting.   30 tablet   2   . oxyCODONE-acetaminophen (PERCOCET/ROXICET) 5-325 MG tablet   Oral   Take 1 tablet by mouth every 8 (eight) hours as needed for severe pain.   30 tablet   0   . polyethylene glycol (MIRALAX / GLYCOLAX) packet   Oral   Take 17 g by mouth daily as needed for moderate constipation.         . potassium chloride SA (KLOR-CON M20) 20 MEQ tablet   Oral   Take 1 tablet (20 mEq total) by mouth 2 (two) times daily.   60 tablet   6   . promethazine (PHENERGAN) 25 MG tablet   Oral   Take 25 mg by mouth every 4 (four) hours as needed for nausea or vomiting.          . torsemide (DEMADEX) 20 MG tablet   Oral   Take 1 tablet (20 mg total) by mouth 2 (two) times daily.   60 tablet   6   . verapamil (CALAN-SR) 240 MG CR tablet   Oral   Take 240 mg by mouth daily.             Allergies:  Penicillins; Aspirin; and Other  Review of Systems: Gen:  Denies  fever, sweats, chills HEENT: Denies blurred vision, double vision,. Cvc:  No dizziness, chest pain or heaviness Resp:   Denies cough or sputum porduction. Gi: Denies swallowing difficulty. Gu:  Denies bladder incontinence, burning urine Ext:   No Joint pain, stiffness or swelling Skin: No skin rash, easy bruising or bleeding or hives Endoc:  No polyuria, polydipsia , polyphagia or weight change Psych: No depression,  insomnia or hallucinations  Other:  All other systems negative  Physical Examination:   VS: BP 140/74 mmHg  Pulse 71  Ht 5\' 1"  (1.549 m)  Wt 236 lb 9.6 oz (107.321 kg)  BMI 44.73 kg/m2  SpO2 95%  General Appearance: No distress  Neuro:without focal findings,  speech normal,  HEENT: PERRLA, EOM intact. Pulmonary: normal breath sounds, No wheezing, No rales;    CardiovascularNormal S1,S2.  No m/r/g.   Abdomen: Benign, Soft, non-tender. Renal:  No costovertebral tenderness  GU:  No performed at this time. Endoc: No evident thyromegaly, no signs of acromegaly or Cushing features Skin:   warm, no rashes, no ecchymosis  Extremities: normal, no cyanosis, clubbing, no edema, warm with normal capillary refill. Other findings:     Thank  you for allowing [facility] to assist in the care of your patient. Our recommendations are noted above.  Please contact us if we can be of further service.   Marda Stalker, MD.  [facility]   Patricia Pesa, M.D.  Vilinda Boehringer, M.D.  Cheral Marker, M.D

## 2015-07-16 NOTE — Patient Instructions (Signed)
Will need a nebulizer through her home care company.  --Start nebulized albuterol twice daily.

## 2015-07-17 ENCOUNTER — Telehealth: Payer: Self-pay | Admitting: *Deleted

## 2015-07-17 DIAGNOSIS — R062 Wheezing: Secondary | ICD-10-CM | POA: Diagnosis not present

## 2015-07-17 DIAGNOSIS — N186 End stage renal disease: Secondary | ICD-10-CM | POA: Diagnosis not present

## 2015-07-17 DIAGNOSIS — M1990 Unspecified osteoarthritis, unspecified site: Secondary | ICD-10-CM | POA: Diagnosis not present

## 2015-07-17 DIAGNOSIS — M5010 Cervical disc disorder with radiculopathy, unspecified cervical region: Secondary | ICD-10-CM | POA: Diagnosis not present

## 2015-07-17 DIAGNOSIS — J449 Chronic obstructive pulmonary disease, unspecified: Secondary | ICD-10-CM | POA: Diagnosis not present

## 2015-07-17 DIAGNOSIS — I132 Hypertensive heart and chronic kidney disease with heart failure and with stage 5 chronic kidney disease, or end stage renal disease: Secondary | ICD-10-CM | POA: Diagnosis not present

## 2015-07-17 DIAGNOSIS — G4733 Obstructive sleep apnea (adult) (pediatric): Secondary | ICD-10-CM | POA: Diagnosis not present

## 2015-07-17 DIAGNOSIS — E1122 Type 2 diabetes mellitus with diabetic chronic kidney disease: Secondary | ICD-10-CM | POA: Diagnosis not present

## 2015-07-17 DIAGNOSIS — J9611 Chronic respiratory failure with hypoxia: Secondary | ICD-10-CM | POA: Diagnosis not present

## 2015-07-17 DIAGNOSIS — I509 Heart failure, unspecified: Secondary | ICD-10-CM | POA: Diagnosis not present

## 2015-07-17 DIAGNOSIS — I5030 Unspecified diastolic (congestive) heart failure: Secondary | ICD-10-CM | POA: Diagnosis not present

## 2015-07-17 NOTE — Telephone Encounter (Signed)
Doris from [facility] calling asking if patient is still on medication listed below Letairis Please call

## 2015-07-17 NOTE — Telephone Encounter (Signed)
Pharmacy calling needing to verify a medication we sent in It is Albuterol for nebulizer The doctor wrote 75 mg That's only 25 vile and insurance requires 120 vile.  We can call back and give it verbally Please call.

## 2015-07-17 NOTE — Telephone Encounter (Signed)
Spoke with Debra Saunders with and gave order to give enough mls for a months which is 185mls. Nothing further needed.

## 2015-07-17 NOTE — Telephone Encounter (Signed)
Please review and contact Doris with [facility]. Thanks!

## 2015-07-18 NOTE — Telephone Encounter (Signed)
Spoke with pt and she states she will no longer need the assistance for the Opsumit. She states she received phone call from [facility] stating she will get copay assistance with the [place].  Called Doris with [facility] and she states she will contact the pt. Nothing further needed from [place] at this time.

## 2015-07-20 DIAGNOSIS — E1122 Type 2 diabetes mellitus with diabetic chronic kidney disease: Secondary | ICD-10-CM | POA: Diagnosis not present

## 2015-07-20 DIAGNOSIS — I5030 Unspecified diastolic (congestive) heart failure: Secondary | ICD-10-CM | POA: Diagnosis not present

## 2015-07-20 DIAGNOSIS — M5010 Cervical disc disorder with radiculopathy, unspecified cervical region: Secondary | ICD-10-CM | POA: Diagnosis not present

## 2015-07-20 DIAGNOSIS — J9611 Chronic respiratory failure with hypoxia: Secondary | ICD-10-CM | POA: Diagnosis not present

## 2015-07-20 DIAGNOSIS — I132 Hypertensive heart and chronic kidney disease with heart failure and with stage 5 chronic kidney disease, or end stage renal disease: Secondary | ICD-10-CM | POA: Diagnosis not present

## 2015-07-20 DIAGNOSIS — N186 End stage renal disease: Secondary | ICD-10-CM | POA: Diagnosis not present

## 2015-07-20 DIAGNOSIS — J449 Chronic obstructive pulmonary disease, unspecified: Secondary | ICD-10-CM | POA: Diagnosis not present

## 2015-07-20 DIAGNOSIS — G4733 Obstructive sleep apnea (adult) (pediatric): Secondary | ICD-10-CM | POA: Diagnosis not present

## 2015-07-20 DIAGNOSIS — M1990 Unspecified osteoarthritis, unspecified site: Secondary | ICD-10-CM | POA: Diagnosis not present

## 2015-07-22 DIAGNOSIS — G4733 Obstructive sleep apnea (adult) (pediatric): Secondary | ICD-10-CM | POA: Diagnosis not present

## 2015-07-25 DIAGNOSIS — I132 Hypertensive heart and chronic kidney disease with heart failure and with stage 5 chronic kidney disease, or end stage renal disease: Secondary | ICD-10-CM | POA: Diagnosis not present

## 2015-07-25 DIAGNOSIS — N186 End stage renal disease: Secondary | ICD-10-CM | POA: Diagnosis not present

## 2015-07-25 DIAGNOSIS — E1122 Type 2 diabetes mellitus with diabetic chronic kidney disease: Secondary | ICD-10-CM | POA: Diagnosis not present

## 2015-07-25 DIAGNOSIS — J449 Chronic obstructive pulmonary disease, unspecified: Secondary | ICD-10-CM | POA: Diagnosis not present

## 2015-07-25 DIAGNOSIS — M5010 Cervical disc disorder with radiculopathy, unspecified cervical region: Secondary | ICD-10-CM | POA: Diagnosis not present

## 2015-07-25 DIAGNOSIS — I5030 Unspecified diastolic (congestive) heart failure: Secondary | ICD-10-CM | POA: Diagnosis not present

## 2015-07-25 DIAGNOSIS — M1990 Unspecified osteoarthritis, unspecified site: Secondary | ICD-10-CM | POA: Diagnosis not present

## 2015-07-25 DIAGNOSIS — G4733 Obstructive sleep apnea (adult) (pediatric): Secondary | ICD-10-CM | POA: Diagnosis not present

## 2015-07-25 DIAGNOSIS — J9611 Chronic respiratory failure with hypoxia: Secondary | ICD-10-CM | POA: Diagnosis not present

## 2015-07-27 ENCOUNTER — Encounter: Payer: Self-pay | Admitting: Internal Medicine

## 2015-07-27 DIAGNOSIS — M5010 Cervical disc disorder with radiculopathy, unspecified cervical region: Secondary | ICD-10-CM | POA: Diagnosis not present

## 2015-07-27 DIAGNOSIS — I132 Hypertensive heart and chronic kidney disease with heart failure and with stage 5 chronic kidney disease, or end stage renal disease: Secondary | ICD-10-CM | POA: Diagnosis not present

## 2015-07-27 DIAGNOSIS — J9611 Chronic respiratory failure with hypoxia: Secondary | ICD-10-CM | POA: Diagnosis not present

## 2015-07-27 DIAGNOSIS — I5030 Unspecified diastolic (congestive) heart failure: Secondary | ICD-10-CM | POA: Diagnosis not present

## 2015-07-27 DIAGNOSIS — N186 End stage renal disease: Secondary | ICD-10-CM | POA: Diagnosis not present

## 2015-07-27 DIAGNOSIS — G4733 Obstructive sleep apnea (adult) (pediatric): Secondary | ICD-10-CM | POA: Diagnosis not present

## 2015-07-27 DIAGNOSIS — E1122 Type 2 diabetes mellitus with diabetic chronic kidney disease: Secondary | ICD-10-CM | POA: Diagnosis not present

## 2015-07-27 DIAGNOSIS — J449 Chronic obstructive pulmonary disease, unspecified: Secondary | ICD-10-CM | POA: Diagnosis not present

## 2015-07-27 DIAGNOSIS — M1990 Unspecified osteoarthritis, unspecified site: Secondary | ICD-10-CM | POA: Diagnosis not present

## 2015-07-30 ENCOUNTER — Ambulatory Visit: Payer: Self-pay | Admitting: *Deleted

## 2015-07-31 ENCOUNTER — Encounter: Payer: Self-pay | Admitting: *Deleted

## 2015-07-31 ENCOUNTER — Other Ambulatory Visit: Payer: Self-pay | Admitting: *Deleted

## 2015-07-31 ENCOUNTER — Encounter: Payer: Self-pay | Admitting: Internal Medicine

## 2015-07-31 ENCOUNTER — Telehealth: Payer: Self-pay

## 2015-07-31 ENCOUNTER — Ambulatory Visit (INDEPENDENT_AMBULATORY_CARE_PROVIDER_SITE_OTHER): Payer: Commercial Managed Care - HMO | Admitting: Internal Medicine

## 2015-07-31 VITALS — BP 163/81 | HR 67 | Ht 61.0 in | Wt 233.8 lb

## 2015-07-31 VITALS — BP 142/78 | HR 84 | Resp 18 | Ht 61.0 in | Wt 233.0 lb

## 2015-07-31 DIAGNOSIS — I132 Hypertensive heart and chronic kidney disease with heart failure and with stage 5 chronic kidney disease, or end stage renal disease: Secondary | ICD-10-CM | POA: Diagnosis not present

## 2015-07-31 DIAGNOSIS — G4733 Obstructive sleep apnea (adult) (pediatric): Secondary | ICD-10-CM | POA: Diagnosis not present

## 2015-07-31 DIAGNOSIS — R001 Bradycardia, unspecified: Secondary | ICD-10-CM | POA: Diagnosis not present

## 2015-07-31 DIAGNOSIS — I495 Sick sinus syndrome: Secondary | ICD-10-CM | POA: Diagnosis not present

## 2015-07-31 DIAGNOSIS — I509 Heart failure, unspecified: Secondary | ICD-10-CM

## 2015-07-31 DIAGNOSIS — E1122 Type 2 diabetes mellitus with diabetic chronic kidney disease: Secondary | ICD-10-CM | POA: Diagnosis not present

## 2015-07-31 DIAGNOSIS — N186 End stage renal disease: Secondary | ICD-10-CM | POA: Diagnosis not present

## 2015-07-31 DIAGNOSIS — I5030 Unspecified diastolic (congestive) heart failure: Secondary | ICD-10-CM | POA: Diagnosis not present

## 2015-07-31 DIAGNOSIS — J449 Chronic obstructive pulmonary disease, unspecified: Secondary | ICD-10-CM | POA: Diagnosis not present

## 2015-07-31 DIAGNOSIS — Z950 Presence of cardiac pacemaker: Secondary | ICD-10-CM | POA: Diagnosis not present

## 2015-07-31 DIAGNOSIS — J9611 Chronic respiratory failure with hypoxia: Secondary | ICD-10-CM | POA: Diagnosis not present

## 2015-07-31 DIAGNOSIS — M1990 Unspecified osteoarthritis, unspecified site: Secondary | ICD-10-CM | POA: Diagnosis not present

## 2015-07-31 DIAGNOSIS — M5010 Cervical disc disorder with radiculopathy, unspecified cervical region: Secondary | ICD-10-CM | POA: Diagnosis not present

## 2015-07-31 LAB — CUP PACEART INCLINIC DEVICE CHECK
Brady Statistic RA Percent Paced: 34 %
Brady Statistic RV Percent Paced: 1 %
Date Time Interrogation Session: 20170207050000
Implantable Lead Implant Date: 20150720
Implantable Lead Implant Date: 20150720
Implantable Lead Location: 753859
Implantable Lead Location: 753860
Implantable Lead Model: 5076
Implantable Lead Model: 5076
Lead Channel Impedance Value: 441 Ohm
Lead Channel Impedance Value: 663 Ohm
Lead Channel Pacing Threshold Amplitude: 0.4 V
Lead Channel Pacing Threshold Amplitude: 0.7 V
Lead Channel Pacing Threshold Pulse Width: 0.4 ms
Lead Channel Pacing Threshold Pulse Width: 0.4 ms
Lead Channel Sensing Intrinsic Amplitude: 18.7 mV
Lead Channel Sensing Intrinsic Amplitude: 4.3 mV
Lead Channel Setting Pacing Amplitude: 2 V
Lead Channel Setting Pacing Amplitude: 2.4 V
Lead Channel Setting Pacing Pulse Width: 0.4 ms
Lead Channel Setting Sensing Sensitivity: 2.5 mV
Pulse Gen Serial Number: 390330

## 2015-07-31 MED ORDER — LOSARTAN POTASSIUM 50 MG PO TABS: 50.0000 mg | ORAL_TABLET | Freq: Every day | ORAL | Status: DC

## 2015-07-31 NOTE — Patient Outreach (Signed)
[facility]) Care Management   07/31/2015  Debra Saunders 08-Jun-1946 [id number]  Debra Saunders is an 70 y.o. female  Subjective: " I haven't gotten my lighter 02 tank yet and my back is really hurting." "I am doing better than I was with my cough and congestion."   Objective:  Blood pressure 142/78, pulse 84, resp. rate 18, height 1.549 m (5\' 1" ), weight 233 lb (105.688 kg), SpO2 95 %. Review of Systems  Eyes: Positive for double vision.  Musculoskeletal: Positive for back pain. Negative for falls.  Psychiatric/Behavioral: Negative for depression and memory loss.  All other systems reviewed and are negative.   Physical Exam  Constitutional: She is oriented to person, place, and time. She appears well-developed and well-nourished.  Cardiovascular: Normal rate and regular rhythm.   Respiratory: Effort normal and breath sounds normal.  On 02   GI: Soft. Bowel sounds are normal.  Musculoskeletal: She exhibits edema.       Left lower leg: She exhibits edema.  Neurological: She is alert and oriented to person, place, and time.  Skin: Skin is warm and dry.  Psychiatric: She has a normal mood and affect. Her behavior is normal. Judgment and thought content normal.    Current Medications:   Current Outpatient Prescriptions  Medication Sig Dispense Refill  . acetaminophen (TYLENOL) 325 MG tablet Take 325-650 mg by mouth every 6 (six) hours as needed for mild pain.     [name] albuterol (PROVENTIL) (2.5 MG/3ML) 0.083% nebulizer solution Take 3 mLs (2.5 mg total) by nebulization every 6 (six) hours as needed for wheezing or shortness of breath. 75 mL 12  . ambrisentan (LETAIRIS) 5 MG tablet Take 1 tablet (5 mg total) by mouth daily. 30 tablet 5  . cyclobenzaprine (FLEXERIL) 5 MG tablet Take 1 tablet (5 mg total) by mouth 3 (three) times daily as needed for muscle spasms. 90 tablet 2  . diazepam (VALIUM) 5 MG tablet Take 1 tablet (5 mg total) by mouth every 8 (eight) hours as  needed for muscle spasms (and/or neck pain). 30 tablet 0  . gabapentin (NEURONTIN) 100 MG capsule Take 200 mg by mouth 3 (three) times daily.    [name] glipiZIDE (GLUCOTROL) 5 MG tablet Take 0.5 tablets (2.5 mg total) by mouth daily before breakfast. 60 tablet 3  . insulin detemir (LEVEMIR) 100 UNIT/ML injection Inject 0.2 mLs (20 Units total) into the skin at bedtime. 10 mL 5  . losartan (COZAAR) 50 MG tablet Take 1 tablet (50 mg total) by mouth daily. 30 tablet 11  . meclizine (ANTIVERT) 25 MG tablet Take 25 mg by mouth daily as needed for dizziness.    . Menthol, Topical Analgesic, (BIOFREEZE EX) Apply 1 application topically as needed (for pain).     . naproxen (EC NAPROSYN) 500 MG EC tablet Take 500 mg by mouth 2 (two) times daily as needed (for pain).    . ondansetron (ZOFRAN) 8 MG tablet Take 1 tablet (8 mg total) by mouth every 8 (eight) hours as needed for nausea or vomiting. 30 tablet 2  . oxyCODONE-acetaminophen (PERCOCET/ROXICET) 5-325 MG tablet Take 1 tablet by mouth every 8 (eight) hours as needed for severe pain. 30 tablet 0  . polyethylene glycol (MIRALAX / GLYCOLAX) packet Take 17 g by mouth daily as needed for moderate constipation.    . potassium chloride SA (KLOR-CON M20) 20 MEQ tablet Take 1 tablet (20 mEq total) by mouth 2 (two) times daily. 60 tablet 6  .  promethazine (PHENERGAN) 25 MG tablet Take 25 mg by mouth every 4 (four) hours as needed for nausea or vomiting.     . torsemide (DEMADEX) 20 MG tablet Take 1 tablet (20 mg total) by mouth 2 (two) times daily. 60 tablet 6  . verapamil (CALAN-SR) 240 MG CR tablet Take 240 mg by mouth daily.     No current facility-administered medications for this visit.    Functional Status:   In your present state of health, do you have any difficulty performing the following activities: 05/31/2015 05/15/2015  Hearing? - N  Vision? - Y  Difficulty concentrating or making decisions? - N  Walking or climbing stairs? - Y  Dressing or bathing?  - Y  Doing errands, shopping? - Y  Preparing Food and eating ? Y -  Using the Toilet? Y -  In the past six months, have you accidently leaked urine? Y -  Do you have problems with loss of bowel control? N -  Managing your Medications? Y -  Managing your Finances? Y -  Housekeeping or managing your Housekeeping? Y -    Fall/Depression Screening:    PHQ 2/9 Scores 05/31/2015 05/01/2015 04/12/2015 06/29/2014 10/06/2013 07/09/2012 04/28/2012  PHQ - 2 Score 0 0 0 0 0 0 0   Fall Risk  05/31/2015 05/01/2015 04/12/2015 06/29/2014 07/09/2012  Falls in the past year? No No No No No  Risk for fall due to : - Impaired mobility;Impaired balance/gait;Medication side effect;Impaired vision Impaired mobility;Impaired balance/gait - -    Assessment: Called [employer] about pt's portable 02 concentrator, Assisted by a pt representative who was going to fax a prescription to Dr Derry Skill office to fill out and fax back. RNCM called and spoke with [place] Miners, who confirmed she would pass the information to Dr. Gilford Rile.  [facility] Cpap supply area called by RNCM to assist pt in ordering a new cpap mask, RT form [facility] to call pt for a fitting in the next 48 hours.  Pt confirmed she would be able to go to MD appt on 08/06/15 for med review related to cardiology appointment.  HF: Pt with SOB but at baseline, no wheezes noted at this time. Denies weight gain or poor appetite. Pt admitted she had been purposefully not taking diuretic related to having to go to the bathroom all the time. RNCM discussed the importance of continuing her diuretic related to her b/p and h/f. Pt verbalized understanding. RNCM encouraged pt to speak to MD about this issue.   Diabetes; Pt sugars wnl with some concern about "lows" pt considered low under 120, RNCM discussed with pt that 120 was normal, but under 70-80 could be concerning for her age. Encouraged pt to discuss this with her MD. Pt skin to bilat feet intact. Left lower leg noted to have 2+ edema which is  pt's baseline.   COPD: Assisted pt with obtaining cpap supplies. Pt continues on 02 and using nebulizer. Pt denies sputum production at this time, and SOB at baseline. Pt expressed she was able to obtain needed medication through [facility].   RNCM discussed with pt transferring her to a health coach and pt verbalized understanding. Pt understands also that [facility] is continuing to follow her case.   Plan: RNCM will transfer pt to [facility] coach for further heart failure and DM teaching.   Rutherford Limerick RN, BSN  [facility] [phone])

## 2015-07-31 NOTE — Telephone Encounter (Signed)
Form received, ready for Dr Thomes Dinning signature

## 2015-07-31 NOTE — Progress Notes (Signed)
Patient Care Team: Jackolyn Confer, MD as PCP - General (Internal Medicine) Thom Chimes, MD as Referring Physician (Allergy) Juanito Doom, MD as Referring Physician (Internal Medicine) Vern Claude, LCSW as Social Worker Gurney Maxin, RN as [facility]   HPI  Debra Saunders is a 70 y.o. 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   She says she is much better with less dyspnea.  She is tolerating the verapamil apart from constipation. .  Intercurrent records from the hospital reviewed as well as office visits.  Blood pressure recommendations were noted   Past Medical History  Diagnosis Date  . Thyroid disorder   . Diabetes mellitus ([place])     type II  . Thyroid nodule   . Sleep apnea   . Obesity   . Hypertension   . Hyperlipidemia   . Urinary incontinence   . CHF (congestive heart failure) ([place]) 2005    Dx at [facility]  . Chronic back pain   . COPD (chronic obstructive pulmonary disease) ([place])     2L [place]  . Sleep apnea   . Lipoma of back   . Arthritis   . Streptococcal bacteremia   . Acute renal failure ([place])   . Stroke ([place])   . Sepsis with metabolic encephalopathy ([place])   . Gastritis   . Symptomatic bradycardia   . Chronic respiratory failure ([facility])     a. on home O2    Past Surgical History  Procedure Laterality Date  . Cholecystectomy    . Vaginal hysterectomy    . Cardiac catheterization  2013    @ [facility]: No obstructive CAD: Only 20% ostial left circumflex.  . Abdominal hysterectomy  1969  . Cyst removal trunk  2002    BACK  . Lipoma removal  2014    back  . Pacemaker insertion  2015    [place]point dual chamber pacemaker implanted by Dr Caryl Comes for symptomatic bradycardia  . Insert / replace / remove pacemaker    . Permanent pacemaker insertion N/A 01/09/2014    Procedure: PERMANENT PACEMAKER INSERTION;  Surgeon: Deboraha Sprang, MD;  Location: [facility]  CATH LAB;  Service: Cardiovascular;  Laterality: N/A;  . Trigger finger release      Current Outpatient Prescriptions  Medication Sig Dispense Refill  . acetaminophen (TYLENOL) 325 MG tablet Take 325-650 mg by mouth every 6 (six) hours as needed for mild pain.     [name] albuterol (PROVENTIL) (2.5 MG/3ML) 0.083% nebulizer solution Take 3 mLs (2.5 mg total) by nebulization every 6 (six) hours as needed for wheezing or shortness of breath. 75 mL 12  . ambrisentan (LETAIRIS) 5 MG tablet Take 1 tablet (5 mg total) by mouth daily. 30 tablet 5  . cyclobenzaprine (FLEXERIL) 5 MG tablet Take 1 tablet (5 mg total) by mouth 3 (three) times daily as needed for muscle spasms. 90 tablet 2  . diazepam (VALIUM) 5 MG tablet Take 1 tablet (5 mg total) by mouth every 8 (eight) hours as needed for muscle spasms (and/or neck pain). 30 tablet 0  . gabapentin (NEURONTIN) 100 MG capsule Take 200 mg by mouth 3 (three) times daily.    [name] glipiZIDE (GLUCOTROL) 5 MG tablet Take 0.5 tablets (2.5 mg total) by mouth daily before breakfast. 60 tablet 3  . insulin detemir (LEVEMIR) 100 UNIT/ML injection Inject 0.2 mLs (20 Units total) into the skin at bedtime. 10  mL 5  . meclizine (ANTIVERT) 25 MG tablet Take 25 mg by mouth daily as needed for dizziness.    . Menthol, Topical Analgesic, (BIOFREEZE EX) Apply 1 application topically as needed (for pain).     . naproxen (EC NAPROSYN) 500 MG EC tablet Take 500 mg by mouth 2 (two) times daily as needed (for pain).    . ondansetron (ZOFRAN) 8 MG tablet Take 1 tablet (8 mg total) by mouth every 8 (eight) hours as needed for nausea or vomiting. 30 tablet 2  . oxyCODONE-acetaminophen (PERCOCET/ROXICET) 5-325 MG tablet Take 1 tablet by mouth every 8 (eight) hours as needed for severe pain. 30 tablet 0  . polyethylene glycol (MIRALAX / GLYCOLAX) packet Take 17 g by mouth daily as needed for moderate constipation.    . potassium chloride SA (KLOR-CON M20) 20 MEQ tablet Take 1 tablet (20 mEq  total) by mouth 2 (two) times daily. 60 tablet 6  . promethazine (PHENERGAN) 25 MG tablet Take 25 mg by mouth every 4 (four) hours as needed for nausea or vomiting.     . torsemide (DEMADEX) 20 MG tablet Take 1 tablet (20 mg total) by mouth 2 (two) times daily. 60 tablet 6  . verapamil (CALAN-SR) 240 MG CR tablet Take 240 mg by mouth daily.     No current facility-administered medications for this visit.    Allergies  Allergen Reactions  . Penicillins Hives, Shortness Of Breath, Swelling and Other (See Comments)    Has patient had a PCN reaction causing immediate rash, facial/tongue/throat swelling, SOB or lightheadedness with hypotension: Yes Has patient had a PCN reaction causing severe rash involving mucus membranes or skin necrosis: No Has patient had a PCN reaction that required hospitalization No Has patient had a PCN reaction occurring within the last 10 years: No If all of the above answers are "NO", then may proceed with Cephalosporin use.  . Aspirin Hives and Other (See Comments)    Pt states that she is unable to take because she had bleeding in her brain.    . Other Other (See Comments)    Pt states that she is unable to take blood thinners because she had bleeding in her brain.     Review of Systems negative except from HPI and PMH  Physical Exam BP 163/81 mmHg  Pulse 67  Ht 5\' 1"  (1.549 m)  Wt 233 lb 12 oz (106.028 kg)  BMI 44.19 kg/m2  SpO2 98% Well developed and well nourished in no acute distress wearing oxygen HENT normal E scleral and icterus clear Neck Supple Device pocket well healed; without hematoma or erythema.  There is no tethering Clear to ausculation Regular  rate and rhythm, 2/6 systolic murmur Soft with active bowel sounds No clubbing cyanosis tr  Edema Alert and oriented, grossly normal motor and sensory function Skin Warm and Dry  ECG demonstrated sinus rhythm without ventricular pacing   Assessment and  Plan  Chronotropic  incompetence  Sinus bradycardia  Atrial tachycardia non sustained with rates 200 msec (ie nonsustained AFib)  Dyspnea on exertion  Secondary pulmonary hypertension  HFpEF  Hypertension  Hx of intracranial hemorrhage   Blood pressure is poorly controlled. As per Dr. Lynann Beaver note from 12/16 we will start losartan 50 mg. She apparently did not tolerate lisinopril so well in the past.  Rhythms are relatively stable.  There was some PMT which was reprogrammed by change in the device from DDDR--DDIR  She has nonsustained atrial fibrillation. A recent  paper demonstrated the need for a CHADS-VASc score of greater than or equal to 6 to justify the initiation of warfarin in patients who have prior intracranial hemorrhage. Certainly that number will be difficult with NOACs but right now her atrial fibrillation episodes are very brief and this is a nonissue

## 2015-07-31 NOTE — Patient Instructions (Signed)
Medication Instructions: 1) Start Losartan 50 mg one tablet by mouth once daily  Labwork: - none  Procedures/Testing: - none  Follow-Up: - Remote monitoring is used to monitor your Pacemaker of ICD from home. This monitoring reduces the number of office visits required to check your device to one time per year. It allows [place] to keep an eye on the functioning of your device to ensure it is working properly. You are scheduled for a device check from home on 10/30/15. You may send your transmission at any time that day. If you have a wireless device, the transmission will be sent automatically. After your physician reviews your transmission, you will receive a postcard with your next transmission date.  - Your physician wants you to follow-up in: 9 months with Dr. Caryl Comes. You will receive a reminder letter in the mail two months in advance. If you don't receive a letter, please call our office to schedule the follow-up appointment.  - Your physician recommends that you schedule a follow-up appointment in: 2 weeks with Dr. Gilford Rile.  Any Additional Special Instructions Will Be Listed Below (If Applicable).     If you need a refill on your cardiac medications before your next appointment, please call your pharmacy.

## 2015-07-31 NOTE — Patient Outreach (Signed)
[facility]) Care Management  07/31/2015  ERNIE TUCCIO September 22, 1945 [id number]   Phone call to patient to follow up on referral to the [facility] [phone] regarding the wheelchair ramp.  This social worker made a conference call to this agency who confirmed receipt of patient's paperwork and they are now making efforts to identify volunteers to buildt her wheelchair ramp. No timeframe given ath this time.   Plan:  This Education officer, museum will follow up with patient in approximately 1 month regarding wheelchair ramp.   [name] [facility] Care Management [phone]

## 2015-07-31 NOTE — Telephone Encounter (Signed)
Spoke to [name] about Debra Saunders getting a smaller oxygen tank to carry around, pt is having a hard time carrying it. Debra Saunders stated that Debra Saunders would be sending  Debra Saunders over paperwork for a new order for a smaller tank. I also stated that Debra Saunders tried to email pt about an appt and Debra Saunders stated that she would have her call the office to hold the appt offered in the email.

## 2015-07-31 NOTE — Telephone Encounter (Signed)
Form completed and faxed back to [place]

## 2015-08-02 DIAGNOSIS — E1122 Type 2 diabetes mellitus with diabetic chronic kidney disease: Secondary | ICD-10-CM | POA: Diagnosis not present

## 2015-08-02 DIAGNOSIS — I132 Hypertensive heart and chronic kidney disease with heart failure and with stage 5 chronic kidney disease, or end stage renal disease: Secondary | ICD-10-CM | POA: Diagnosis not present

## 2015-08-02 DIAGNOSIS — G4733 Obstructive sleep apnea (adult) (pediatric): Secondary | ICD-10-CM | POA: Diagnosis not present

## 2015-08-02 DIAGNOSIS — M5010 Cervical disc disorder with radiculopathy, unspecified cervical region: Secondary | ICD-10-CM | POA: Diagnosis not present

## 2015-08-02 DIAGNOSIS — J449 Chronic obstructive pulmonary disease, unspecified: Secondary | ICD-10-CM | POA: Diagnosis not present

## 2015-08-02 DIAGNOSIS — M1990 Unspecified osteoarthritis, unspecified site: Secondary | ICD-10-CM | POA: Diagnosis not present

## 2015-08-02 DIAGNOSIS — J9611 Chronic respiratory failure with hypoxia: Secondary | ICD-10-CM | POA: Diagnosis not present

## 2015-08-02 DIAGNOSIS — N186 End stage renal disease: Secondary | ICD-10-CM | POA: Diagnosis not present

## 2015-08-02 DIAGNOSIS — I5030 Unspecified diastolic (congestive) heart failure: Secondary | ICD-10-CM | POA: Diagnosis not present

## 2015-08-03 DIAGNOSIS — G4733 Obstructive sleep apnea (adult) (pediatric): Secondary | ICD-10-CM | POA: Diagnosis not present

## 2015-08-06 ENCOUNTER — Ambulatory Visit (INDEPENDENT_AMBULATORY_CARE_PROVIDER_SITE_OTHER): Payer: Commercial Managed Care - HMO | Admitting: Internal Medicine

## 2015-08-06 ENCOUNTER — Encounter: Payer: Self-pay | Admitting: Internal Medicine

## 2015-08-06 VITALS — BP 153/80 | HR 74 | Temp 98.7°F | Ht 61.0 in | Wt 236.0 lb

## 2015-08-06 DIAGNOSIS — E118 Type 2 diabetes mellitus with unspecified complications: Secondary | ICD-10-CM | POA: Diagnosis not present

## 2015-08-06 DIAGNOSIS — Z794 Long term (current) use of insulin: Secondary | ICD-10-CM | POA: Diagnosis not present

## 2015-08-06 DIAGNOSIS — M545 Low back pain, unspecified: Secondary | ICD-10-CM | POA: Insufficient documentation

## 2015-08-06 DIAGNOSIS — J9611 Chronic respiratory failure with hypoxia: Secondary | ICD-10-CM

## 2015-08-06 LAB — COMPREHENSIVE METABOLIC PANEL
ALT: 29 U/L (ref 0–35)
AST: 36 U/L (ref 0–37)
Albumin: 3.7 g/dL (ref 3.5–5.2)
Alkaline Phosphatase: 126 U/L — ABNORMAL HIGH (ref 39–117)
BUN: 12 mg/dL (ref 6–23)
CO2: 32 mEq/L (ref 19–32)
Calcium: 9.3 mg/dL (ref 8.4–10.5)
Chloride: 102 mEq/L (ref 96–112)
Creatinine, Ser: 0.73 mg/dL (ref 0.40–1.20)
GFR: 100.76 mL/min (ref >60.00)
Glucose, Bld: 142 mg/dL — ABNORMAL HIGH (ref 70–99)
Potassium: 3.8 mEq/L (ref 3.5–5.1)
Sodium: 140 mEq/L (ref 135–145)
Total Bilirubin: 0.4 mg/dL (ref 0.2–1.2)
Total Protein: 7.5 g/dL (ref 6.0–8.3)

## 2015-08-06 LAB — HEMOGLOBIN A1C: Hgb A1c MFr Bld: 6.5 % (ref 4.6–6.5)

## 2015-08-06 MED ORDER — INSULIN DETEMIR 100 UNIT/ML ~~LOC~~ SOLN: 18.0000 [IU] | Freq: Every day | SUBCUTANEOUS | Status: DC

## 2015-08-06 NOTE — Assessment & Plan Note (Signed)
Having frequent low BG. Will decrease Levemir to 18units daily. She will call if any more low BG less 70. Follow up in 4 weeks and prn.

## 2015-08-06 NOTE — Assessment & Plan Note (Signed)
Symptoms relatively stable. Will continue current medications. Reviewed notes from pulmonary.

## 2015-08-06 NOTE — Progress Notes (Signed)
Pre visit review using our clinic review tool, if applicable. No additional management support is needed unless otherwise documented below in the visit note. 

## 2015-08-06 NOTE — Assessment & Plan Note (Signed)
Recent aching low back pain with lifting her oxygen tank. Muscular strain. Will increase Flexeril to 10mg  up to three times daily. Discussed risk of sedation with this. Follow up prn.

## 2015-08-06 NOTE — Patient Instructions (Addendum)
Try increasing Flexeril to 10mg  up to every three hours as needed for back pain.  Try decreasing Levemir to 18units daily.  Call if any more blood sugars below 70.   Labs today.  Email with blood sugars.

## 2015-08-06 NOTE — Progress Notes (Signed)
Subjective:    Patient ID: Debra Saunders, female    DOB: 1946/05/13, 70 y.o.   MRN: [id number]  HPI  70YO female presents for follow up.  Lower back pain - Pain started about 2 weeks ago after lifting oxygen tank. Aching pain. Using Flexeril with some improvement.  Recently started on Losartan. She has limited Torsemide to once daily because of urinary frequency.  DM - BG up and down. Lows in 70s almost daily. Highes in 230s. Compliant with insulin. Daughter gives her insulin at night.  Not sleeping well. Goes to bed at [place] fall asleep until 2am. Sedentary during the daytime. Unable to be physically active.  Wt Readings from Last 3 Encounters:  08/06/15 236 lb (107.049 kg)  07/31/15 233 lb (105.688 kg)  07/31/15 233 lb 12 oz (106.028 kg)   BP Readings from Last 3 Encounters:  08/06/15 153/80  07/31/15 142/78  07/31/15 163/81    Past Medical History  Diagnosis Date  . Thyroid disorder   . Diabetes mellitus ([place])     type II  . Thyroid nodule   . Sleep apnea   . Obesity   . Hypertension   . Hyperlipidemia   . Urinary incontinence   . CHF (congestive heart failure) ([place]) 2005    Dx at [facility]  . Chronic back pain   . COPD (chronic obstructive pulmonary disease) ([place])     2L [place]  . Sleep apnea   . Lipoma of back   . Arthritis   . Streptococcal bacteremia   . Acute renal failure ([place])   . Stroke ([place])   . Sepsis with metabolic encephalopathy ([place])   . Gastritis   . Symptomatic bradycardia   . Chronic respiratory failure (HCC)     a. on home O2   Family History  Problem Relation Age of Onset  . Heart disease Mother   . Hypertension Mother   . COPD Mother     was a smoker  . Thyroid disease Mother   . Hypertension Father   . Hypertension Sister   . Thyroid disease Sister   . Hypertension Sister   . Thyroid disease Sister   . Breast cancer Maternal Aunt    Past Surgical History  Procedure Laterality Date  . Cholecystectomy    . Vaginal  hysterectomy    . Cardiac catheterization  2013    @ [facility]: No obstructive CAD: Only 20% ostial left circumflex.  . Abdominal hysterectomy  1969  . Cyst removal trunk  2002    BACK  . Lipoma removal  2014    back  . Pacemaker insertion  2015    [place] dual chamber pacemaker implanted by Dr Caryl Comes for symptomatic bradycardia  . Insert / replace / remove pacemaker    . Permanent pacemaker insertion N/A 01/09/2014    Procedure: PERMANENT PACEMAKER INSERTION;  Surgeon: Deboraha Sprang, MD;  Location: [facility] CATH LAB;  Service: Cardiovascular;  Laterality: N/A;  . Trigger finger release     Social History   Social History  . Marital Status: Divorced    Spouse Name: N/A  . Number of Children: N/A  . Years of Education: N/A   Occupational History  . Retired- factory work     exposed to [place]  . Smoking status: Former Smoker -- 0.30 packs/day for 2 years    Types: Cigarettes    Quit date: 06/23/1990  . Smokeless tobacco: Never Used  .  Alcohol Use: No  . Drug Use: No  . Sexual Activity: Not Asked   Other Topics Concern  . None   Social History Narrative   Lives in [place] with daughter. Has 4 children. No pets.      Work - Restaurant manager, fast food, retired      Diet - regular    Review of Systems  Constitutional: Positive for fatigue. Negative for fever, chills, appetite change and unexpected weight change.  Eyes: Negative for visual disturbance.  Respiratory: Positive for shortness of breath. Negative for cough, chest tightness and wheezing.   Cardiovascular: Negative for chest pain, palpitations and leg swelling.  Gastrointestinal: Negative for nausea, vomiting, abdominal pain, diarrhea and constipation.  Musculoskeletal: Positive for myalgias, back pain and arthralgias.  Skin: Negative for color change and rash.  Hematological: Negative for adenopathy. Does not bruise/bleed easily.  Psychiatric/Behavioral: Positive for sleep disturbance.  Negative for dysphoric mood. The patient is not nervous/anxious.        Objective:    BP 153/80 mmHg  Pulse 74  Temp(Src) 98.7 F (37.1 C) (Oral)  Ht 5\' 1"  (1.549 m)  Wt 236 lb (107.049 kg)  BMI 44.61 kg/m2  SpO2 97% Physical Exam  Constitutional: She is oriented to person, place, and time. She appears well-developed and well-nourished. No distress.  HENT:  Head: Normocephalic and atraumatic.  Right Ear: External ear normal.  Left Ear: External ear normal.  Nose: Nose normal.  Mouth/Throat: Oropharynx is clear and moist. No oropharyngeal exudate.  Eyes: Conjunctivae are normal. Pupils are equal, round, and reactive to light. Right eye exhibits no discharge. Left eye exhibits no discharge. No scleral icterus.  Neck: Normal range of motion. Neck supple. No tracheal deviation present. No thyromegaly present.  Cardiovascular: Normal rate, regular rhythm, normal heart sounds and intact distal pulses.  Exam reveals no gallop and no friction rub.   No murmur heard. Pulmonary/Chest: Effort normal and breath sounds normal. No respiratory distress. She has no wheezes. She has no rales. She exhibits no tenderness.  Musculoskeletal: Normal range of motion. She exhibits no edema or tenderness.  Lymphadenopathy:    She has no cervical adenopathy.  Neurological: She is alert and oriented to person, place, and time. No cranial nerve deficit. She exhibits normal muscle tone. Coordination normal.  Skin: Skin is warm and dry. No rash noted. She is not diaphoretic. No erythema. No pallor.  Psychiatric: She has a normal mood and affect. Her behavior is normal. Judgment and thought content normal.          Assessment & Plan:   Problem List Items Addressed This Visit      Unprioritized   Chronic respiratory failure with hypoxia (HCC) (Chronic)    Symptoms relatively stable. Will continue current medications. Reviewed notes from pulmonary.      Diabetes mellitus type 2, controlled ([place])  (Chronic)    Having frequent low BG. Will decrease Levemir to 18units daily. She will call if any more low BG less 70. Follow up in 4 weeks and prn.      Relevant Medications   insulin detemir (LEVEMIR) 100 UNIT/ML injection   Other Relevant Orders   Comprehensive metabolic panel   Hemoglobin A1c   Low back pain - Primary    Recent aching low back pain with lifting her oxygen tank. Muscular strain. Will increase Flexeril to 10mg  up to three times daily. Discussed risk of sedation with this. Follow up prn.          Return  in about 4 weeks (around 09/03/2015) for Recheck.

## 2015-08-07 DIAGNOSIS — N186 End stage renal disease: Secondary | ICD-10-CM | POA: Diagnosis not present

## 2015-08-07 DIAGNOSIS — E1122 Type 2 diabetes mellitus with diabetic chronic kidney disease: Secondary | ICD-10-CM | POA: Diagnosis not present

## 2015-08-07 DIAGNOSIS — M5010 Cervical disc disorder with radiculopathy, unspecified cervical region: Secondary | ICD-10-CM | POA: Diagnosis not present

## 2015-08-07 DIAGNOSIS — J9611 Chronic respiratory failure with hypoxia: Secondary | ICD-10-CM | POA: Diagnosis not present

## 2015-08-07 DIAGNOSIS — G4733 Obstructive sleep apnea (adult) (pediatric): Secondary | ICD-10-CM | POA: Diagnosis not present

## 2015-08-07 DIAGNOSIS — I5030 Unspecified diastolic (congestive) heart failure: Secondary | ICD-10-CM | POA: Diagnosis not present

## 2015-08-07 DIAGNOSIS — I132 Hypertensive heart and chronic kidney disease with heart failure and with stage 5 chronic kidney disease, or end stage renal disease: Secondary | ICD-10-CM | POA: Diagnosis not present

## 2015-08-07 DIAGNOSIS — J449 Chronic obstructive pulmonary disease, unspecified: Secondary | ICD-10-CM | POA: Diagnosis not present

## 2015-08-07 DIAGNOSIS — M1990 Unspecified osteoarthritis, unspecified site: Secondary | ICD-10-CM | POA: Diagnosis not present

## 2015-08-09 ENCOUNTER — Encounter: Payer: Self-pay | Admitting: Internal Medicine

## 2015-08-09 ENCOUNTER — Other Ambulatory Visit: Payer: Self-pay | Admitting: *Deleted

## 2015-08-09 NOTE — Patient Outreach (Signed)
[facility]) Care Management  08/09/2015  Debra Saunders 1945-07-09 [id number]  RN Health Coach telephone call to patient.  Hipaa compliance verified. RN Health Coach  introduced self to patient. Patient was very agreeable to follow up out reach calls. Patient was referred by   Plan: [place] will do follow up call within a month for assessment. Patient agreed to follow up call Debra Saunders.  Debra Saunders, BSN, RN [facility] Care Management RN Health Coach Phone: [place] complies with applicable Federal civil rights laws and does not discriminate on the basis of race, color, national origin, age, disability, or sex. Espaol (Spanish)  [place] cumple con las leyes federales de derechos civiles aplicables y no discrimina por motivos de raza, color, nacionalidad, edad, discapacidad o sexo.    Ti?[place] Vi?t ([place])  [place] tun th? lu?t dn quy?n hi?n hnh c?a Lin bang v khng phn bi?t ?i x? d?a trn ch?ng t?c, mu da, ngu?n g?c qu?c gia, ? tu?i, khuy?t t?t, ho?c gi?i tnh.    (Arabic)    [place]                      .

## 2015-08-10 DIAGNOSIS — J9611 Chronic respiratory failure with hypoxia: Secondary | ICD-10-CM | POA: Diagnosis not present

## 2015-08-10 DIAGNOSIS — I5030 Unspecified diastolic (congestive) heart failure: Secondary | ICD-10-CM | POA: Diagnosis not present

## 2015-08-10 DIAGNOSIS — M1990 Unspecified osteoarthritis, unspecified site: Secondary | ICD-10-CM | POA: Diagnosis not present

## 2015-08-10 DIAGNOSIS — I132 Hypertensive heart and chronic kidney disease with heart failure and with stage 5 chronic kidney disease, or end stage renal disease: Secondary | ICD-10-CM | POA: Diagnosis not present

## 2015-08-10 DIAGNOSIS — M5010 Cervical disc disorder with radiculopathy, unspecified cervical region: Secondary | ICD-10-CM | POA: Diagnosis not present

## 2015-08-10 DIAGNOSIS — N186 End stage renal disease: Secondary | ICD-10-CM | POA: Diagnosis not present

## 2015-08-10 DIAGNOSIS — G4733 Obstructive sleep apnea (adult) (pediatric): Secondary | ICD-10-CM | POA: Diagnosis not present

## 2015-08-10 DIAGNOSIS — E1122 Type 2 diabetes mellitus with diabetic chronic kidney disease: Secondary | ICD-10-CM | POA: Diagnosis not present

## 2015-08-10 DIAGNOSIS — J449 Chronic obstructive pulmonary disease, unspecified: Secondary | ICD-10-CM | POA: Diagnosis not present

## 2015-08-13 DIAGNOSIS — J9611 Chronic respiratory failure with hypoxia: Secondary | ICD-10-CM | POA: Diagnosis not present

## 2015-08-13 DIAGNOSIS — M5010 Cervical disc disorder with radiculopathy, unspecified cervical region: Secondary | ICD-10-CM | POA: Diagnosis not present

## 2015-08-13 DIAGNOSIS — N186 End stage renal disease: Secondary | ICD-10-CM | POA: Diagnosis not present

## 2015-08-13 DIAGNOSIS — E1122 Type 2 diabetes mellitus with diabetic chronic kidney disease: Secondary | ICD-10-CM | POA: Diagnosis not present

## 2015-08-13 DIAGNOSIS — G4733 Obstructive sleep apnea (adult) (pediatric): Secondary | ICD-10-CM | POA: Diagnosis not present

## 2015-08-13 DIAGNOSIS — I5030 Unspecified diastolic (congestive) heart failure: Secondary | ICD-10-CM | POA: Diagnosis not present

## 2015-08-13 DIAGNOSIS — J449 Chronic obstructive pulmonary disease, unspecified: Secondary | ICD-10-CM | POA: Diagnosis not present

## 2015-08-13 DIAGNOSIS — M1990 Unspecified osteoarthritis, unspecified site: Secondary | ICD-10-CM | POA: Diagnosis not present

## 2015-08-13 DIAGNOSIS — I132 Hypertensive heart and chronic kidney disease with heart failure and with stage 5 chronic kidney disease, or end stage renal disease: Secondary | ICD-10-CM | POA: Diagnosis not present

## 2015-08-14 ENCOUNTER — Other Ambulatory Visit: Payer: Self-pay | Admitting: Internal Medicine

## 2015-08-14 ENCOUNTER — Other Ambulatory Visit: Payer: Self-pay | Admitting: *Deleted

## 2015-08-14 MED ORDER — AMBRISENTAN 5 MG PO TABS: 5.0000 mg | ORAL_TABLET | Freq: Every day | ORAL | Status: DC

## 2015-08-15 ENCOUNTER — Encounter: Payer: Self-pay | Admitting: Internal Medicine

## 2015-08-17 DIAGNOSIS — I509 Heart failure, unspecified: Secondary | ICD-10-CM | POA: Diagnosis not present

## 2015-08-21 ENCOUNTER — Other Ambulatory Visit: Payer: Self-pay | Admitting: *Deleted

## 2015-08-21 DIAGNOSIS — G4733 Obstructive sleep apnea (adult) (pediatric): Secondary | ICD-10-CM | POA: Diagnosis not present

## 2015-08-21 NOTE — Patient Outreach (Signed)
[facility]) Care Management  08/21/2015  Debra Saunders 11/11/1945 [id number]  Phone call to patient to follow up on the referral to the [place].  Per patient, they have not contacted her yet.  Phone call made to follow up with this agency.  Voice mail message left.  This Education officer, museum also received a Data processing manager from [employer]  That agency contacted as well regarding getting a wheelchair ramp.[facility] [phone]).   [name] [facility] Care Management [phone]

## 2015-08-22 ENCOUNTER — Other Ambulatory Visit: Payer: Self-pay | Admitting: *Deleted

## 2015-08-22 NOTE — Patient Outreach (Signed)
[facility]) Care Management  08/22/2015  Debra Saunders [birth year] [id number]   Phone call from the [place] stating that all of patient's paperwork has been received, however they have not identified a group to install the wheelchair ramp.  They are still actively searching for a church group who would be able to complete the work.   [name] Care Management [phone]

## 2015-08-31 ENCOUNTER — Encounter: Payer: Self-pay | Admitting: *Deleted

## 2015-08-31 ENCOUNTER — Ambulatory Visit: Payer: Self-pay | Admitting: *Deleted

## 2015-08-31 ENCOUNTER — Other Ambulatory Visit: Payer: Self-pay | Admitting: *Deleted

## 2015-08-31 NOTE — Patient Outreach (Signed)
[facility]) Care Management  [place]  08/31/2015   Debra Saunders [birth year] [id number]  Subjective RN Health Coach telephone call to patient.  Hipaa compliance verified. Per patient she is on O2 3 L continuously. Per patient her weight is 235 pounds 5 ft 1 inches . Patient does not exercise. She is sedentary mostly. Per patient she is not taking her fluid medication twice a day as per ordered. Per patient sometimes only once because she is awake a lot at night and she can't leave her bedroom because she needs to be near the bathroom. Per patient she goes out most time only just to go to the Dr. Patient does have some problems with constipation and uses prunes to help.  Per patient her children takes turns staying with her at night. Per patient she has swelling in her lower extremities and use to wear ted hose . Patient says she weighs most of the time. RN discussed the importance of daily weights and how it shows if she is retaining fluid. Explained to patient that this is very important and is important to show if the CHF is worse. Per patient she  Hasn't had any falls but she is unstable on her feet. Patient does wear a med alert. Patient has agreed to follow up outreach calls:   Objective:   Current Medications:  Current Outpatient Prescriptions  Medication Sig Dispense Refill  . acetaminophen (TYLENOL) 325 MG tablet Take 325-650 mg by mouth every 6 (six) hours as needed for mild pain.     [name] albuterol (PROVENTIL) (2.5 MG/3ML) 0.083% nebulizer solution Take 3 mLs (2.5 mg total) by nebulization every 6 (six) hours as needed for wheezing or shortness of breath. 75 mL 12  . ambrisentan (LETAIRIS) 5 MG tablet Take 1 tablet (5 mg total) by mouth daily. 30 tablet 5  . cyclobenzaprine (FLEXERIL) 5 MG tablet Take 1 tablet (5 mg total) by mouth 3 (three) times daily as needed for muscle spasms. 90 tablet 2  . gabapentin (NEURONTIN) 100 MG capsule Take 200 mg by mouth 3  (three) times daily.    [name] glipiZIDE (GLUCOTROL) 5 MG tablet Take 0.5 tablets (2.5 mg total) by mouth daily before breakfast. 60 tablet 3  . insulin detemir (LEVEMIR) 100 UNIT/ML injection Inject 0.18 mLs (18 Units total) into the skin at bedtime. 10 mL 5  . losartan (COZAAR) 50 MG tablet Take 1 tablet (50 mg total) by mouth daily. 30 tablet 11  . meclizine (ANTIVERT) 25 MG tablet Take 25 mg by mouth daily as needed for dizziness.    . Menthol, Topical Analgesic, (BIOFREEZE EX) Apply 1 application topically as needed (for pain).     . naproxen (EC NAPROSYN) 500 MG EC tablet Take 500 mg by mouth 2 (two) times daily as needed (for pain).    . ondansetron (ZOFRAN) 8 MG tablet Take 1 tablet (8 mg total) by mouth every 8 (eight) hours as needed for nausea or vomiting. 30 tablet 2  . oxyCODONE-acetaminophen (PERCOCET/ROXICET) 5-325 MG tablet Take 1 tablet by mouth every 8 (eight) hours as needed for severe pain. 30 tablet 0  . polyethylene glycol (MIRALAX / GLYCOLAX) packet Take 17 g by mouth daily as needed for moderate constipation.    . potassium chloride SA (KLOR-CON M20) 20 MEQ tablet Take 1 tablet (20 mEq total) by mouth 2 (two) times daily. 60 tablet 6  . promethazine (PHENERGAN) 25 MG tablet Take 25 mg by mouth every 4 (  four) hours as needed for nausea or vomiting.     . torsemide (DEMADEX) 20 MG tablet Take 1 tablet (20 mg total) by mouth 2 (two) times daily. 60 tablet 6  . verapamil (CALAN-SR) 240 MG CR tablet TAKE ONE TABLET BY MOUTH ONCE DAILY 30 tablet 2   No current facility-administered medications for this visit.    Functional Status:  In your present state of health, do you have any difficulty performing the following activities: 08/31/2015 05/31/2015  Hearing? N -  Vision? N -  Difficulty concentrating or making decisions? N -  Walking or climbing stairs? Y -  Dressing or bathing? Y -  Doing errands, shopping? Y -  Conservation officer, nature and eating ? Y Y  Using the Toilet? N Y  In the  past six months, have you accidently leaked urine? Y Y  Do you have problems with loss of bowel control? N N  Managing your Medications? N Y  Managing your Finances? N Y  Housekeeping or managing your Housekeeping? [name]   . Fall/Depression Screening: PHQ 2/9 Scores 08/31/2015 08/31/2015 05/31/2015 05/01/2015 04/12/2015 06/29/2014 10/06/2013  PHQ - 2 Score 0 0 0 0 0 0 0      THN CM Care Plan Problem One        Most Recent Value   Care Plan Problem One  Knowledge deficit in self management of congestive heart failure   Role Documenting the Problem One  [place] for Problem One  Active   THN Long Term Goal (31-90 days)  Patient will not have any admissions for CHF within the next 90 days   THN Long Term Goal Start Date  08/31/15   Interventions for Problem One [place] reminded patient to keep appointments with primary care physician. RN Health Coach will monitor patient monthly telephonic. RN reminded patient the importance of taking medications as per physician ordered   [facility] CM Short Term Goal #1 (0-30 days)  Patient will weigh daily and record in log within the next 30 days   THN CM Short Term Goal #1 Start Date  08/31/15   Interventions for Short Term Goal #1  RN Health Coach discussed the importance of weight gain of 3 pounds or more pounds in a day or 5 pounds in one week. Patient will notify doctor. RN Health Coach discussed the importance of weighing the same time each day.   THN CM Short Term Goal #2 (0-30 days)  Patient will report medication adherence within the next 30 days   THN CM Short Term Goal #2 Start Date  08/31/15   Interventions for Short Term Goal #2  Rn discussed with patient the importance of taking medication as per Dr ordered. RN will follow up with patient of adherence of medication   THN CM Short Term Goal #3 (0-30 days)  Patient will verbalize eating heart health food and snacks within 30 days   THN CM Short Term Goal #3 Start Date   08/31/15   Interventions for Short Tern Goal #3  RN sent patient a list of healthy snacks.  RN with send patient eduational material on a heart healthy diet      Assessment: Patient will benefit from [place] telephonic outreach for support and education for disease management of CHF.   Plan:  RN will send educational  information on Heart Healthy diet RN will send educational  information on eating healthy snacks RN  will send patient hypo and hyperglycemic symptoms and actions RN will send patient EMMI information on Home safety RN will send patient EMMI information on  Falls prevention RN will follow up outreach within a month   Johny Shock, BSN, [place] Coach Phone: [place] complies with applicable Federal civil rights laws and does not discriminate on the basis of race, color, national origin, age, disability, or sex. Espaol (Spanish)  [place] cumple con las leyes federales de derechos civiles aplicables y no discrimina por motivos de raza, color, nacionalidad, edad, discapacidad o sexo.    Ti[place] Vi[place]t ([place])  [employer] tun th? lu?t dn quy?n hi?n hnh c?a Lin bang v khng phn bi?t ?i x? d?a trn ch?ng t?c, mu da, ngu?n g?c qu?c gia, ? tu?i, khuy?t t?t, ho?c gi?i tnh.    (Arabic)    [place]                      .

## 2015-09-05 ENCOUNTER — Ambulatory Visit (INDEPENDENT_AMBULATORY_CARE_PROVIDER_SITE_OTHER): Payer: Commercial Managed Care - HMO | Admitting: Internal Medicine

## 2015-09-05 ENCOUNTER — Encounter: Payer: Self-pay | Admitting: Internal Medicine

## 2015-09-05 VITALS — BP 143/70 | HR 87 | Temp 98.5°F | Ht 61.0 in | Wt 238.5 lb

## 2015-09-05 DIAGNOSIS — J9611 Chronic respiratory failure with hypoxia: Secondary | ICD-10-CM | POA: Diagnosis not present

## 2015-09-05 DIAGNOSIS — I10 Essential (primary) hypertension: Secondary | ICD-10-CM

## 2015-09-05 DIAGNOSIS — Z794 Long term (current) use of insulin: Secondary | ICD-10-CM

## 2015-09-05 DIAGNOSIS — E118 Type 2 diabetes mellitus with unspecified complications: Secondary | ICD-10-CM

## 2015-09-05 NOTE — Patient Instructions (Addendum)
Email with blood sugar readings next week.  Follow up 4 weeks.

## 2015-09-05 NOTE — Assessment & Plan Note (Signed)
BG well controlled by report. Encouraged her to eat meals with protein and snacks with protein to help with stability of blood sugars.

## 2015-09-05 NOTE — Progress Notes (Signed)
Pre visit review using our clinic review tool, if applicable. No additional management support is needed unless otherwise documented below in the visit note. 

## 2015-09-05 NOTE — Assessment & Plan Note (Signed)
Symptomatically stable. Continue supplemental oxygen 3LNC

## 2015-09-05 NOTE — Progress Notes (Signed)
Subjective:    Patient ID: Debra Saunders, female    DOB: 1945/11/02, 70 y.o.   MRN: [id number]  HPI  70YO female presents for follow up.  Seen in 07/2015 for low BG and back pain.  Recently having some knee pain. Taking Flexeril with some improvement.  DM - Not eating well. Typically twice per day, small amounts. Daughter has been holding Levemir at times. BG this morning was 140. Last night 200. Family is worried that pt is not eating enough. Pt is not concerned about this. She is trying to lose weight.  Chronic hypoxia - Symptomatically stable. No worsening dyspnea, chest pain. Continues on 3L [place].  Wt Readings from Last 3 Encounters:  09/05/15 238 lb 8 oz (108.183 kg)  08/31/15 235 lb (106.595 kg)  08/06/15 236 lb (107.049 kg)   BP Readings from Last 3 Encounters:  09/05/15 143/70  08/06/15 153/80  07/31/15 142/78    Past Medical History  Diagnosis Date  . Thyroid disorder   . Diabetes mellitus ([place])     type II  . Thyroid nodule   . Sleep apnea   . Obesity   . Hypertension   . Hyperlipidemia   . Urinary incontinence   . CHF (congestive heart failure) ([place]) 2005    Dx at [facility]  . Chronic back pain   . COPD (chronic obstructive pulmonary disease) ([place])     2L [place]  . Sleep apnea   . Lipoma of back   . Arthritis   . Streptococcal bacteremia   . Acute renal failure ([place])   . Stroke ([place])   . Sepsis with metabolic encephalopathy ([place])   . Gastritis   . Symptomatic bradycardia   . Chronic respiratory failure (HCC)     a. on home O2   Family History  Problem Relation Age of Onset  . Heart disease Mother   . Hypertension Mother   . COPD Mother     was a smoker  . Thyroid disease Mother   . Hypertension Father   . Hypertension Sister   . Thyroid disease Sister   . Hypertension Sister   . Thyroid disease Sister   . Breast cancer Maternal Aunt    Past Surgical History  Procedure Laterality Date  . Cholecystectomy    . Vaginal hysterectomy    . Cardiac  catheterization  2013    @ [place]: No obstructive CAD: Only 20% ostial left circumflex.  . Abdominal hysterectomy  1969  . Cyst removal trunk  2002    BACK  . Lipoma removal  2014    back  . Pacemaker insertion  2015    [place] dual chamber pacemaker implanted by Dr Caryl Comes for symptomatic bradycardia  . Insert / replace / remove pacemaker    . Permanent pacemaker insertion N/A 01/09/2014    Procedure: PERMANENT PACEMAKER INSERTION;  Surgeon: Deboraha Sprang, MD;  Location: [facility] CATH LAB;  Service: Cardiovascular;  Laterality: N/A;  . Trigger finger release     Social History   Social History  . Marital Status: Divorced    Spouse Name: N/A  . Number of Children: N/A  . Years of Education: N/A   Occupational History  . Retired- factory work     exposed to [place]  . Smoking status: Former Smoker -- 0.30 packs/day for 2 years    Types: Cigarettes    Quit date: 06/23/1990  . Smokeless tobacco: Never Used  .  Alcohol Use: No  . Drug Use: No  . Sexual Activity: Not Asked   Other Topics Concern  . None   Social History Narrative   Lives in [place] with daughter. Has 4 children. No pets.      Work - Restaurant manager, fast food, retired      Diet - regular    Review of Systems  Constitutional: Negative for fever, chills, appetite change, fatigue and unexpected weight change.  Eyes: Negative for visual disturbance.  Respiratory: Negative for shortness of breath.   Cardiovascular: Negative for chest pain and leg swelling.  Gastrointestinal: Negative for nausea, vomiting, abdominal pain, diarrhea and constipation.  Musculoskeletal: Positive for myalgias, back pain and arthralgias.  Skin: Negative for color change and rash.  Hematological: Negative for adenopathy. Does not bruise/bleed easily.  Psychiatric/Behavioral: Negative for sleep disturbance and dysphoric mood. The patient is not nervous/anxious.        Objective:    BP 143/70 mmHg  Pulse  87  Temp(Src) 98.5 F (36.9 C) (Oral)  Ht 5\' 1"  (1.549 m)  Wt 238 lb 8 oz (108.183 kg)  BMI 45.09 kg/m2  SpO2 90% Physical Exam  Constitutional: She is oriented to person, place, and time. She appears well-developed and well-nourished. No distress.  HENT:  Head: Normocephalic and atraumatic.  Right Ear: External ear normal.  Left Ear: External ear normal.  Nose: Nose normal.  Mouth/Throat: Oropharynx is clear and moist. No oropharyngeal exudate.  Eyes: Conjunctivae are normal. Pupils are equal, round, and reactive to light. Right eye exhibits no discharge. Left eye exhibits no discharge. No scleral icterus.  Neck: Normal range of motion. Neck supple. No tracheal deviation present. No thyromegaly present.  Cardiovascular: Normal rate, regular rhythm, normal heart sounds and intact distal pulses.  Exam reveals no gallop and no friction rub.   No murmur heard. Pulmonary/Chest: Effort normal and breath sounds normal. No respiratory distress. She has no wheezes. She has no rales. She exhibits no tenderness.  Musculoskeletal: Normal range of motion. She exhibits edema (chronic non-pitting BLE). She exhibits no tenderness.  Lymphadenopathy:    She has no cervical adenopathy.  Neurological: She is alert and oriented to person, place, and time. No cranial nerve deficit. She exhibits normal muscle tone. Coordination normal.  Skin: Skin is warm and dry. No rash noted. She is not diaphoretic. No erythema. No pallor.  Psychiatric: She has a normal mood and affect. Her behavior is normal. Judgment and thought content normal.          Assessment & Plan:   Problem List Items Addressed This Visit      Unprioritized   Chronic respiratory failure with hypoxia (HCC) (Chronic)    Symptomatically stable. Continue supplemental oxygen 3L[place]      Diabetes mellitus type 2, controlled ([place]) - Primary (Chronic)    BG well controlled by report. Encouraged her to eat meals with protein and snacks with  protein to help with stability of blood sugars.      Hypertension (Chronic)    BP Readings from Last 3 Encounters:  09/05/15 143/70  08/06/15 153/80  07/31/15 142/78   BP well controlled. Continue current medication.          Return in about 4 weeks (around 10/03/2015) for Recheck.  Ronette Deter, MD Internal Medicine [facility]

## 2015-09-05 NOTE — Assessment & Plan Note (Signed)
BP Readings from Last 3 Encounters:  09/05/15 143/70  08/06/15 153/80  07/31/15 142/78   BP well controlled. Continue current medication.

## 2015-09-13 ENCOUNTER — Encounter: Payer: Self-pay | Admitting: Internal Medicine

## 2015-09-14 DIAGNOSIS — I509 Heart failure, unspecified: Secondary | ICD-10-CM | POA: Diagnosis not present

## 2015-09-19 ENCOUNTER — Other Ambulatory Visit: Payer: Self-pay | Admitting: *Deleted

## 2015-09-19 DIAGNOSIS — G4733 Obstructive sleep apnea (adult) (pediatric): Secondary | ICD-10-CM | POA: Diagnosis not present

## 2015-09-19 NOTE — Patient Outreach (Addendum)
[facility]) Care Management  09/19/2015  Debra Saunders 1946/06/12 [id number]  Phone call to patient to follow up on referral to the [facility] regarding her wheelchair ramp.  Per patient, she has not heard anything yet.  No calls or letters in the mail.  Per patient she has contacted her church family, however they do not have the funds available.   Plan:  This Education officer, museum will work on [employer] operator churches and community groups to assist with the wheelchair ramp being built.    Debra Saunders [facility] Care Management [phone]

## 2015-09-25 ENCOUNTER — Encounter: Payer: Self-pay | Admitting: Internal Medicine

## 2015-09-26 DIAGNOSIS — J9611 Chronic respiratory failure with hypoxia: Secondary | ICD-10-CM | POA: Diagnosis not present

## 2015-09-28 ENCOUNTER — Other Ambulatory Visit: Payer: Self-pay | Admitting: *Deleted

## 2015-09-28 ENCOUNTER — Ambulatory Visit: Payer: Self-pay | Admitting: *Deleted

## 2015-09-28 DIAGNOSIS — I5030 Unspecified diastolic (congestive) heart failure: Secondary | ICD-10-CM

## 2015-10-01 ENCOUNTER — Other Ambulatory Visit: Payer: Self-pay | Admitting: *Deleted

## 2015-10-01 ENCOUNTER — Encounter: Payer: Self-pay | Admitting: *Deleted

## 2015-10-01 NOTE — Patient Outreach (Signed)
[place] [facility]) Care Management  10/01/2015  Debra Saunders 05-30-1946 [id number]   Phone call to [place] [employer] [place] [phone] to discuss the possibility of a wheelchair ramp being built for patient.  Voicemail message left requesting a return call.    [name] [facility] Care Management [phone]

## 2015-10-01 NOTE — Patient Outreach (Signed)
[facility]) Care Management  Debra Saunders  10/01/2015   Debra Saunders 12/01/1945 [id number]  Subjective: RN Health Coach telephone call to patient.  [place] compliance verified. Per patient she is taking Demadex twice a day. She stated that she has not been taking the medication but only once a day and sometimes not at all. Per patient stated ," the water pills make her thirsty, and she is going to the bathroom all night long."  She is using a whole pack of adult briefs of 18 in one day. Per patient she can't afford this. Patient stated she was on lasix but does have some kidney issues. Per patient her weight went from 234- to 236 to 239. RN Health Coach explained to patient that she is getting into some issues with the CHF. Patient had reported she had swelling in her extremities. RN explained about medication adherence with CHF. Patient is on O2 and has recently obtained a small O2 tank to carry around. She is reporting less back pain with transporting tank.  Objective:   Encounter Medications:  Outpatient Encounter Prescriptions as of 09/28/2015  Medication Sig Note  . acetaminophen (TYLENOL) 325 MG tablet Take 325-650 mg by mouth every 6 (six) hours as needed for mild pain.    [name] albuterol (PROVENTIL) (2.5 MG/3ML) 0.083% nebulizer solution Take 3 mLs (2.5 mg total) by nebulization every 6 (six) hours as needed for wheezing or shortness of breath.   [name] ambrisentan (LETAIRIS) 5 MG tablet Take 1 tablet (5 mg total) by mouth daily.   . cyclobenzaprine (FLEXERIL) 5 MG tablet Take 1 tablet (5 mg total) by mouth 3 (three) times daily as needed for muscle spasms.   [name] gabapentin (NEURONTIN) 100 MG capsule Take 200 mg by mouth 3 (three) times daily. 07/31/2015: Wants to ask MD if she needs to stay on this   . glipiZIDE (GLUCOTROL) 5 MG tablet Take 0.5 tablets (2.5 mg total) by mouth daily before breakfast.   . insulin detemir (LEVEMIR) 100 UNIT/ML injection Inject 0.18 mLs (18 Units  total) into the skin at bedtime.   [name] losartan (COZAAR) 50 MG tablet Take 1 tablet (50 mg total) by mouth daily. 07/31/2015: New medication started by Dr. Caryl Comes, 07/31/15  . meclizine (ANTIVERT) 25 MG tablet Take 25 mg by mouth daily as needed for dizziness.   . Menthol, Topical Analgesic, (BIOFREEZE EX) Apply 1 application topically as needed (for pain).    . naproxen (EC NAPROSYN) 500 MG EC tablet Take 500 mg by mouth 2 (two) times daily as needed (for pain).   . ondansetron (ZOFRAN) 8 MG tablet Take 1 tablet (8 mg total) by mouth every 8 (eight) hours as needed for nausea or vomiting. 07/31/2015: Only takes as needed  . oxyCODONE-acetaminophen (PERCOCET/ROXICET) 5-325 MG tablet Take 1 tablet by mouth every 8 (eight) hours as needed for severe pain. 07/31/2015: Has been taking qd since hurt back a few days ago   . polyethylene glycol (MIRALAX / GLYCOLAX) packet Take 17 g by mouth daily as needed for moderate constipation. 05/31/2015: Needing it about every day  . potassium chloride SA (KLOR-CON M20) 20 MEQ tablet Take 1 tablet (20 mEq total) by mouth 2 (two) times daily.   . promethazine (PHENERGAN) 25 MG tablet Take 25 mg by mouth every 4 (four) hours as needed for nausea or vomiting.    . torsemide (DEMADEX) 20 MG tablet Take 1 tablet (20 mg total) by mouth 2 (two) times daily. 07/31/2015:  Has not taken in 2 weeks related to being sick and unable to go to the bathroom  . verapamil (CALAN-SR) 240 MG CR tablet TAKE ONE TABLET BY MOUTH ONCE DAILY    No facility-administered encounter medications on file as of 09/28/2015.    Functional Status:  In your present state of health, do you have any difficulty performing the following activities: 09/28/2015 08/31/2015  Hearing? N N  Vision? N N  Difficulty concentrating or making decisions? N N  Walking or climbing stairs? Y Y  Dressing or bathing? Y Y  Doing errands, shopping? [name]  Preparing Food and eating ? - Y  Using the Toilet? - N  In the past six months,  have you accidently leaked urine? - Y  Do you have problems with loss of bowel control? - N  Managing your Medications? - N  Managing your Finances? - N  Housekeeping or managing your Housekeeping? - Y    Fall/Depression Screening: PHQ 2/9 Scores 09/28/2015 08/31/2015 08/31/2015 05/31/2015 05/01/2015 04/12/2015 06/29/2014  PHQ - 2 Score 0 0 0 0 0 0 0    Assessment:  Patient has not been taking her diuretic medication as prescribed This patient will continue to benefit for Health Coach outreach for education and support for congestive heart failure. [facility] CM Care Plan Problem One        Most Recent Value   Care Plan Problem One  Knowledge deficit in self management of congestive heart failure   Role Documenting the Problem One  [place] for Problem One  Active   [facility]N Long Term Goal (31-90 days)  Patient will continue to not have any admissions for CHF within the next 90 days   [facility] Long Term Goal Start Date  09/28/15   Interventions for Problem One [place] reminded patient to keep appointments with primary care physician. RN Health Coach will monitor patient monthly telephonic. RN reminded patient the importance of taking medications as per physician ordered   [facility] CM Short Term Goal #1 (0-30 days)  Patient will weigh daily and record in log within the next 30 days   [facility] CM Short Term Goal #1 Start Date  09/28/15 [continued]   Interventions for Short Term Goal #1  [place] discussed the importance of weight gain of 3 pounds or more pounds in a day or 5 pounds in one week. Patient will notify doctor. RN Health Coach discussed the importance of weighing the same time each day.   [facility] CM Short Term Goal #2 (0-30 days)  Patient will report medication adherence within the next 30 days   [facility] CM Short Term Goal #2 Start Date  09/28/15 [not met/ patient has not been taking her fluid pill as ordered]   Interventions for Short Term Goal #2  Rn discussed with patient  the importance of taking medication as per Dr ordered. RN will follow up with patient of adherence of medication   THN CM Short Term Goal #3 (0-30 days)  Patient will verbalize eating heart health food and snacks within 30 days   [facility]N CM Short Term Goal #3 Start Date  09/28/15 [continue. Patient is trying to eat better]   Interventions for Short Tern Goal #3  RN sent patient a list of healthy snacks.  RN sent patient eduational material on a heart healthy diet. RN will continue to follow up with patient        Plan:  RN health coach referred patient to pharmacy RN sent patient EMMI information on  Working with your doctor RN sent patient EMMI information on Understanding water pills and thirst RN sent patient EMMI information on When to call your Doctor or 911 RN sent patient EMMI information on Keeping track of your weight RN sent education information on Congestive Heart Failure RN sent  educational information on dash diet plan RN sent  educational information on low sodium diet plan  [place] Management [phone]

## 2015-10-09 ENCOUNTER — Other Ambulatory Visit: Payer: Self-pay | Admitting: *Deleted

## 2015-10-09 NOTE — Patient Outreach (Signed)
[place] [facility]) Care Management  10/09/2015  Debra Saunders 1945/08/30 [id number]   Phone call from [place] of [employer].  She coordinates at wheelchair ramp ministry through this church and has agreed to contact patient to discuss installing a wheelchair ramp.  Per [name], she will contact patient in approximately 2 weeks to schedule time to complete measurements.  She would expect to install the wheelchair ramp in approximately 2 months due to jobs schedule previously with other clients.  Patient informed of expectation to receive a call from [employer] within 2 weeks.   [name] [facility] Care Management [phone]

## 2015-10-10 ENCOUNTER — Other Ambulatory Visit: Payer: Self-pay | Admitting: Pharmacist

## 2015-10-10 NOTE — Patient Outreach (Signed)
[place] [facility]) Care Management  [place]   10/10/2015  DEBRINA FERGURSON 08/08/1945 [id number]  Subjective:  Ms Camilleri is a 70 year old female who was referred to [place] by Joaquim Lai [place] regarding torsemide and patient report of urinary frequency.   Pharmacist contacted patient via phone and patient verified name and date of birth, pharmacist explained purpose of call.  Reviewed medication list in patient's chart with her.    Patient reports she gets medications from [facility] in [place].  She reports she is presently taking torsemide 20 mg one tablet daily and not twice daily as directed due to frequent urination, she reports she has been taking torsemide once daily for the past 3-4 months.  Patient reports that she used to take first torsemide dose around 0800-0900 in the morning and the second dose around 1800-1900 in the evening.      Patient reports she weighs herself and records weight daily and reports on 10/10/15 she weighed 237 pounds.    Objective:   Current Medications: Current Outpatient Prescriptions  Medication Sig Dispense Refill  . albuterol (PROVENTIL) (2.5 MG/3ML) 0.083% nebulizer solution Take 3 mLs (2.5 mg total) by nebulization every 6 (six) hours as needed for wheezing or shortness of breath. 75 mL 12  . ambrisentan (LETAIRIS) 5 MG tablet Take 1 tablet (5 mg total) by mouth daily. 30 tablet 5  . cyclobenzaprine (FLEXERIL) 5 MG tablet Take 1 tablet (5 mg total) by mouth 3 (three) times daily as needed for muscle spasms. 90 tablet 2  . gabapentin (NEURONTIN) 100 MG capsule Take 200 mg by mouth 3 (three) times daily.    [name] glipiZIDE (GLUCOTROL) 5 MG tablet Take 0.5 tablets (2.5 mg total) by mouth daily before breakfast. 60 tablet 3  . insulin detemir (LEVEMIR) 100 UNIT/ML injection Inject 0.18 mLs (18 Units total) into the skin at bedtime. 10 mL 5  . losartan (COZAAR) 50 MG tablet Take 1 tablet (50 mg total) by mouth daily. 30  tablet 11  . ondansetron (ZOFRAN) 8 MG tablet Take 1 tablet (8 mg total) by mouth every 8 (eight) hours as needed for nausea or vomiting. 30 tablet 2  . oxyCODONE-acetaminophen (PERCOCET/ROXICET) 5-325 MG tablet Take 1 tablet by mouth every 8 (eight) hours as needed for severe pain. 30 tablet 0  . polyethylene glycol (MIRALAX / GLYCOLAX) packet Take 17 g by mouth daily as needed for moderate constipation.    . potassium chloride SA (KLOR-CON M20) 20 MEQ tablet Take 1 tablet (20 mEq total) by mouth 2 (two) times daily. 60 tablet 6  . promethazine (PHENERGAN) 25 MG tablet Take 25 mg by mouth every 4 (four) hours as needed for nausea or vomiting. Reported on 10/10/2015    . torsemide (DEMADEX) 20 MG tablet Take 1 tablet (20 mg total) by mouth 2 (two) times daily. 60 tablet 6  . verapamil (CALAN-SR) 240 MG CR tablet TAKE ONE TABLET BY MOUTH ONCE DAILY 30 tablet 2  . acetaminophen (TYLENOL) 325 MG tablet Take 325-650 mg by mouth every 6 (six) hours as needed for mild pain. Reported on 10/10/2015    . meclizine (ANTIVERT) 25 MG tablet Take 25 mg by mouth daily as needed for dizziness. Reported on 10/10/2015    . Menthol, Topical Analgesic, (BIOFREEZE EX) Apply 1 application topically as needed (for pain).     . naproxen (EC NAPROSYN) 500 MG EC tablet Take 500 mg by mouth 2 (two) times daily as  needed (for pain). Reported on 10/10/2015     No current facility-administered medications for this visit.    Functional Status: In your present state of health, do you have any difficulty performing the following activities: 09/28/2015 08/31/2015  Hearing? N N  Vision? N N  Difficulty concentrating or making decisions? N N  Walking or climbing stairs? Y Y  Dressing or bathing? Y Y  Doing errands, shopping? [name]  Preparing Food and eating ? - Y  Using the Toilet? - N  In the past six months, have you accidently leaked urine? - Y  Do you have problems with loss of bowel control? - N  Managing your Medications? - N   Managing your Finances? - N  Housekeeping or managing your Housekeeping? - Y    Fall/Depression Screening: PHQ 2/9 Scores 09/28/2015 08/31/2015 08/31/2015 05/31/2015 05/01/2015 04/12/2015 06/29/2014  PHQ - 2 Score 0 0 0 0 0 0 0    Assessment:  Counseled patient on importance of taking her torsemide as directed as it helps to reduce excess fluid build up.  Counseled patient that taking the second dose closer to 1600 or 1700 may be better than taking close to bedtime.  Also counseled patient that she could try regular bathroom breaks such as going to the bathroom every few hours to try to reduce urgent bathroom trips leading to bladder accidents.    Counseled patient that hyperglycemia can cause frequent urination----she reports that Dr Gilford Rile is managing her insulin adjustments.    With patient permission called [facility] and was told that torsemide was last filled 06/12/15 for #60 and the prescription [facility] file at [facility] is presently expired given it is over 64 year old.    Plan:  1) Patient has an appointment with Dr Gilford Rile, PCP scheduled on 10/19/15, will send note and initial assessment to PCP to make her aware.    2) Will follow-up with patient after her appointment with her PCP.    Karrie Meres, PharmD, [place] [phone]

## 2015-10-11 ENCOUNTER — Encounter: Payer: Self-pay | Admitting: Pharmacist

## 2015-10-15 DIAGNOSIS — I509 Heart failure, unspecified: Secondary | ICD-10-CM | POA: Diagnosis not present

## 2015-10-19 ENCOUNTER — Ambulatory Visit (INDEPENDENT_AMBULATORY_CARE_PROVIDER_SITE_OTHER): Payer: Commercial Managed Care - HMO | Admitting: Internal Medicine

## 2015-10-19 ENCOUNTER — Encounter: Payer: Self-pay | Admitting: Internal Medicine

## 2015-10-19 VITALS — BP 134/68 | HR 72 | Temp 98.5°F | Ht 61.0 in | Wt 239.0 lb

## 2015-10-19 DIAGNOSIS — J9611 Chronic respiratory failure with hypoxia: Secondary | ICD-10-CM | POA: Diagnosis not present

## 2015-10-19 DIAGNOSIS — M549 Dorsalgia, unspecified: Secondary | ICD-10-CM | POA: Diagnosis not present

## 2015-10-19 DIAGNOSIS — E118 Type 2 diabetes mellitus with unspecified complications: Secondary | ICD-10-CM

## 2015-10-19 DIAGNOSIS — Z794 Long term (current) use of insulin: Secondary | ICD-10-CM

## 2015-10-19 DIAGNOSIS — G8929 Other chronic pain: Secondary | ICD-10-CM | POA: Insufficient documentation

## 2015-10-19 DIAGNOSIS — I10 Essential (primary) hypertension: Secondary | ICD-10-CM | POA: Diagnosis not present

## 2015-10-19 MED ORDER — LANCETS MISC: Status: DC

## 2015-10-19 MED ORDER — TORSEMIDE 20 MG PO TABS: 20.0000 mg | ORAL_TABLET | Freq: Two times a day (BID) | ORAL | Status: DC

## 2015-10-19 NOTE — Progress Notes (Signed)
Subjective:    Patient ID: Debra Saunders, female    DOB: 1946/02/24, 70 y.o.   MRN: [id number]  HPI  70YO female presents for follow up.  DM - BG elevated in 200-300, but unsure if test strips working cause out of date. Compliant with medication.  Lab Results  Component Value Date   HGBA1C 6.5 08/06/2015   Back pain - Having pain in lower back with minimal movement. Aching pain. Does not radiate. Most pain is on right side but occasionally both sides. No known injury.Taking Flexeril with some improvement.  Wt Readings from Last 3 Encounters:  10/19/15 239 lb (108.41 kg)  09/05/15 238 lb 8 oz (108.183 kg)  08/31/15 235 lb (106.595 kg)   BP Readings from Last 3 Encounters:  10/19/15 134/68  09/05/15 143/70  08/06/15 153/80    Past Medical History  Diagnosis Date  . Thyroid disorder   . Diabetes mellitus ([place])     type II  . Thyroid nodule   . Sleep apnea   . Obesity   . Hypertension   . Hyperlipidemia   . Urinary incontinence   . CHF (congestive heart failure) ([place]) 2005    Dx at [facility]  . Chronic back pain   . COPD (chronic obstructive pulmonary disease) ([place])     2L [place]  . Sleep apnea   . Lipoma of back   . Arthritis   . Streptococcal bacteremia   . Acute renal failure ([place])   . Stroke ([place])   . Sepsis with metabolic encephalopathy ([place])   . Gastritis   . Symptomatic bradycardia   . Chronic respiratory failure (HCC)     a. on home O2   Family History  Problem Relation Age of Onset  . Heart disease Mother   . Hypertension Mother   . COPD Mother     was a smoker  . Thyroid disease Mother   . Hypertension Father   . Hypertension Sister   . Thyroid disease Sister   . Hypertension Sister   . Thyroid disease Sister   . Breast cancer Maternal Aunt    Past Surgical History  Procedure Laterality Date  . Cholecystectomy    . Vaginal hysterectomy    . Cardiac catheterization  2013    @ [place]: No obstructive CAD: Only 20% ostial left circumflex.  .  Abdominal hysterectomy  1969  . Cyst removal trunk  2002    BACK  . Lipoma removal  2014    back  . Pacemaker insertion  2015    [place] dual chamber pacemaker implanted by Dr Caryl Comes for symptomatic bradycardia  . Insert / replace / remove pacemaker    . Permanent pacemaker insertion N/A 01/09/2014    Procedure: PERMANENT PACEMAKER INSERTION;  Surgeon: Deboraha Sprang, MD;  Location: [facility] CATH LAB;  Service: Cardiovascular;  Laterality: N/A;  . Trigger finger release     Social History   Social History  . Marital Status: Divorced    Spouse Name: N/A  . Number of Children: N/A  . Years of Education: N/A   Occupational History  . Retired- factory work     exposed to [place]  . Smoking status: Former Smoker -- 0.30 packs/day for 2 years    Types: Cigarettes    Quit date: 06/23/1990  . Smokeless tobacco: Never Used  . Alcohol Use: No  . Drug Use: No  . Sexual Activity: Not Asked  Other Topics Concern  . None   Social History Narrative   Lives in [place] with daughter. Has 4 children. No pets.      Work - Restaurant manager, fast food, retired      Diet - regular    Review of Systems  Constitutional: Positive for fatigue. Negative for fever, chills, appetite change and unexpected weight change.  Eyes: Negative for visual disturbance.  Respiratory: Positive for shortness of breath. Negative for chest tightness and wheezing.   Cardiovascular: Negative for chest pain and leg swelling.  Gastrointestinal: Negative for nausea, vomiting, abdominal pain, diarrhea and constipation.  Musculoskeletal: Positive for myalgias, back pain and arthralgias.  Skin: Negative for color change and rash.  Hematological: Negative for adenopathy. Does not bruise/bleed easily.  Psychiatric/Behavioral: Negative for suicidal ideas, sleep disturbance and dysphoric mood. The patient is not nervous/anxious.        Objective:    BP 134/68 mmHg  Pulse 72  Temp(Src)  98.5 F (36.9 C) (Oral)  Ht 5\' 1"  (1.549 m)  Wt 239 lb (108.41 kg)  BMI 45.18 kg/m2  SpO2 90% Physical Exam  Constitutional: She is oriented to person, place, and time. She appears well-developed and well-nourished. No distress.  HENT:  Head: Normocephalic and atraumatic.  Right Ear: External ear normal.  Left Ear: External ear normal.  Nose: Nose normal.  Mouth/Throat: Oropharynx is clear and moist. No oropharyngeal exudate.  Eyes: Conjunctivae are normal. Pupils are equal, round, and reactive to light. Right eye exhibits no discharge. Left eye exhibits no discharge. No scleral icterus.  Neck: Normal range of motion. Neck supple. No tracheal deviation present. No thyromegaly present.  Cardiovascular: Normal rate, regular rhythm, normal heart sounds and intact distal pulses.  Exam reveals no gallop and no friction rub.   No murmur heard. Pulmonary/Chest: Effort normal and breath sounds normal. No accessory muscle usage. No tachypnea. No respiratory distress. She has no decreased breath sounds. She has no wheezes. She has no rhonchi. She has no rales. She exhibits no tenderness.  Musculoskeletal: Normal range of motion. She exhibits no edema or tenderness.  Lymphadenopathy:    She has no cervical adenopathy.  Neurological: She is alert and oriented to person, place, and time. No cranial nerve deficit. She exhibits normal muscle tone. Coordination normal.  Skin: Skin is warm and dry. No rash noted. She is not diaphoretic. No erythema. No pallor.  Psychiatric: She has a normal mood and affect. Her behavior is normal. Judgment and thought content normal.          Assessment & Plan:   Problem List Items Addressed This Visit      Unprioritized   Chronic back pain    Chronic low back pain.  Likely DJD and muscular spasm. Minimal improvement with muscle relaxers. Will set up evaluation with Dr. Sharlet [name] at [place]. Question if she might be candidate for ESI. She is a poor candidate for  surgical intervention. She prefers to hold off on imaging until appointment with Dr. Sharlet [name].      Relevant Orders   Ambulatory referral to Orthopedic Surgery   Chronic respiratory failure with hypoxia (HCC) (Chronic)    Symptoms relatively stable. Will continue current care.      Diabetes mellitus type 2, controlled ([place]) - Primary (Chronic)    BG elevated at home however test strips outdated. Will send Rx for new test strips. Continue Levemir 20units daily and Glipizide. Follow up with A1c in 4 weeks.      Relevant Orders  Comprehensive metabolic panel   Hemoglobin A1c   Hypertension (Chronic)    BP Readings from Last 3 Encounters:  10/19/15 134/68  09/05/15 143/70  08/06/15 153/80   BP well controlled. Continue current medication.      Relevant Medications   torsemide (DEMADEX) 20 MG tablet       Return in about 4 weeks (around 11/16/2015) for Recheck.  Ronette Deter, MD Internal Medicine [facility]

## 2015-10-19 NOTE — Patient Instructions (Signed)
Labs in 4 weeks.  We will set up evaluation with Dr. Sharlet [name] for evaluation of back pain.  Follow up here in 4 weeks.

## 2015-10-19 NOTE — Assessment & Plan Note (Signed)
Symptoms relatively stable. Will continue current care.

## 2015-10-19 NOTE — Assessment & Plan Note (Signed)
BG elevated at home however test strips outdated. Will send Rx for new test strips. Continue Levemir 20units daily and Glipizide. Follow up with A1c in 4 weeks.

## 2015-10-19 NOTE — Progress Notes (Signed)
Pre visit review using our clinic review tool, if applicable. No additional management support is needed unless otherwise documented below in the visit note. 

## 2015-10-19 NOTE — Assessment & Plan Note (Signed)
BP Readings from Last 3 Encounters:  10/19/15 134/68  09/05/15 143/70  08/06/15 153/80   BP well controlled. Continue current medication.

## 2015-10-19 NOTE — Assessment & Plan Note (Signed)
Chronic low back pain.  Likely DJD and muscular spasm. Minimal improvement with muscle relaxers. Will set up evaluation with Dr. Sharlet [name] at [place]. Question if she might be candidate for ESI. She is a poor candidate for surgical intervention. She prefers to hold off on imaging until appointment with Dr. Sharlet [name].

## 2015-10-20 DIAGNOSIS — G4733 Obstructive sleep apnea (adult) (pediatric): Secondary | ICD-10-CM | POA: Diagnosis not present

## 2015-10-25 ENCOUNTER — Other Ambulatory Visit: Payer: Self-pay | Admitting: *Deleted

## 2015-10-25 ENCOUNTER — Ambulatory Visit: Payer: Self-pay | Admitting: *Deleted

## 2015-10-25 NOTE — Patient Outreach (Signed)
[facility]) Care Management  10/25/2015  Debra Saunders 1946-03-28 [id number]   RN Health Coach attempted #1 Follow up outreach call to patient.  Patient was unavailable. HIPPA compliance voicemail message was left with return callback number.    [place] Care Management [phone]

## 2015-10-26 DIAGNOSIS — J9611 Chronic respiratory failure with hypoxia: Secondary | ICD-10-CM | POA: Diagnosis not present

## 2015-10-29 ENCOUNTER — Other Ambulatory Visit: Payer: Self-pay | Admitting: *Deleted

## 2015-10-29 ENCOUNTER — Ambulatory Visit: Payer: Self-pay | Admitting: *Deleted

## 2015-10-29 ENCOUNTER — Encounter: Payer: Self-pay | Admitting: *Deleted

## 2015-10-30 ENCOUNTER — Ambulatory Visit (INDEPENDENT_AMBULATORY_CARE_PROVIDER_SITE_OTHER): Payer: Commercial Managed Care - HMO | Admitting: *Deleted

## 2015-10-30 DIAGNOSIS — I495 Sick sinus syndrome: Secondary | ICD-10-CM

## 2015-10-30 NOTE — Patient Outreach (Signed)
[place] [facility]) Care Management  Debra Saunders  10/30/2015   Debra Saunders Dec 22, 1945 [id number]  Subjective: Subjective: RN Health Coach telephone call to patient.  [place]ip[place] compliance verified. Per patient she is doing fair. Patient is having a hard time with urinary incontinence. Per patient she is going all night long and if she tries to sleep through the night she has soaked her bed. Patient stated that she goes to the bathroom and by the time she gets ready to leave the bathroom, she is unable to go far because she has to go again and usually soaks her briefs before she can get back to the bathroom. Patient has become unable to go out of her house because she is incontinent before she gets to her destination. Patient is not taking her second fluid pill. Per patient the incontinent episodes are about the same if she takes one or two. Patient has discussed this with her Dr.  Shelly Bombard Health Coach discussed with patient about the fluid pill and the actions. RN also discussed stress incontinence.   RN Health Coach suggested patient call her Dr and ask for a referral to a urologist. Patient is having some swelling in extremities but is afraid to take the additional fluid pill as her Dr prescribed. Patient is aware of wearing elastic support stocking but has not went to get them due to her incontinence. RN discussed with patient about getting measurements for support hose. Patient stated she is eating but is decreasing her fluid intake due to the excessive urination. Per patient she is now afraid she is getting dehydrated because of going to the bathroom a lot and decreasing her intake of fluids.    Objective:   Encounter Medications:  Outpatient Encounter Prescriptions as of 10/29/2015  Medication Sig Note  . acetaminophen (TYLENOL) 325 MG tablet Take 325-650 mg by mouth every 6 (six) hours as needed for mild pain. Reported on 10/10/2015   . albuterol (PROVENTIL) (2.5 MG/3ML) 0.083%  nebulizer solution Take 3 mLs (2.5 mg total) by nebulization every 6 (six) hours as needed for wheezing or shortness of breath.   [name] ambrisentan (LETAIRIS) 5 MG tablet Take 1 tablet (5 mg total) by mouth daily.   . cyclobenzaprine (FLEXERIL) 5 MG tablet Take 1 tablet (5 mg total) by mouth 3 (three) times daily as needed for muscle spasms. 10/10/2015: Patients reports usually takes twice daily.    [name] gabapentin (NEURONTIN) 100 MG capsule Take 200 mg by mouth 3 (three) times daily. 07/31/2015: Wants to ask MD if she needs to stay on this   . glipiZIDE (GLUCOTROL) 5 MG tablet Take 0.5 tablets (2.5 mg total) by mouth daily before breakfast.   . insulin detemir (LEVEMIR) 100 UNIT/ML injection Inject 0.18 mLs (18 Units total) into the skin at bedtime. (Patient taking differently: Inject 20 Units into the skin at bedtime. ) 10/10/2015: Patient reports using 20 units daily now  . Lancets MISC Use as directed to check blood sugar three times daily   . losartan (COZAAR) 50 MG tablet Take 1 tablet (50 mg total) by mouth daily. 07/31/2015: New medication started by Dr. Caryl Comes, 07/31/15  . meclizine (ANTIVERT) 25 MG tablet Take 25 mg by mouth daily as needed for dizziness. Reported on 10/10/2015   . Menthol, Topical Analgesic, (BIOFREEZE EX) Apply 1 application topically as needed (for pain).    . naproxen (EC NAPROSYN) 500 MG EC tablet Take 500 mg by mouth 2 (two) times daily as needed (for  pain). Reported on 10/10/2015 10/10/2015: Patient has not used this month  . ondansetron (ZOFRAN) 8 MG tablet Take 1 tablet (8 mg total) by mouth every 8 (eight) hours as needed for nausea or vomiting. 07/31/2015: Only takes as needed  . oxyCODONE-acetaminophen (PERCOCET/ROXICET) 5-325 MG tablet Take 1 tablet by mouth every 8 (eight) hours as needed for severe pain. 07/31/2015: Has been taking qd since hurt back a few days ago   . polyethylene glycol (MIRALAX / GLYCOLAX) packet Take 17 g by mouth daily as needed for moderate constipation.  05/31/2015: Needing it about every day  . potassium chloride SA (KLOR-CON M20) 20 MEQ tablet Take 1 tablet (20 mEq total) by mouth 2 (two) times daily.   . promethazine (PHENERGAN) 25 MG tablet Take 25 mg by mouth every 4 (four) hours as needed for nausea or vomiting. Reported on 10/10/2015   . torsemide (DEMADEX) 20 MG tablet Take 1 tablet (20 mg total) by mouth 2 (two) times daily. 10/30/2015: Patient reported taking one tablet and sometimes even skips a day  . verapamil (CALAN-SR) 240 MG CR tablet TAKE ONE TABLET BY MOUTH ONCE DAILY    No facility-administered encounter medications on file as of 10/29/2015.    Functional Status:  In your present state of health, do you have any difficulty performing the following activities: 10/29/2015 09/28/2015  Hearing? N N  Vision? N N  Difficulty concentrating or making decisions? N N  Walking or climbing stairs? Y Y  Dressing or bathing? Y Y  Doing errands, shopping? [name]  Preparing Food and eating ? Y -  Using the Toilet? Y -  In the past six months, have you accidently leaked urine? Y -  Do you have problems with loss of bowel control? N -  Managing your Medications? N -  Managing your Finances? N -  Housekeeping or managing your Housekeeping? Y -    Fall/Depression Screening: PHQ 2/9 Scores 10/29/2015 09/28/2015 08/31/2015 08/31/2015 05/31/2015 05/01/2015 04/12/2015  PHQ - 2 Score 0 0 0 0 0 0 0    THN CM Care Plan Problem One        Most Recent Value   Care Plan Problem One  Knowledge deficit in self management of congestive heart failure   Role Documenting the Problem One  [place] for Problem One  Active   THN Long Term Goal (31-90 days)  Patient will continue to not have any admissions for CHF within the next 90 days   THN Long Term Goal Start Date  09/28/15   Interventions for Problem One [place] reminded patient to keep appointments with primary care physician. RN Health Coach will monitor patient monthly  telephonic. RN reminded patient the importance of taking medications as per physician ordered   [facility] CM Short Term Goal #1 (0-30 days)  Patient will weigh daily and record in log within the next 30 days   THN CM Short Term Goal #1 Start Date  10/29/15 [continued]   Interventions for Short Term Goal #1  [place] discussed the importance of weight gain of 3 pounds or more pounds in a day or 5 pounds in one week. Patient will notify doctor. RN Health Coach discussed the importance of weighing the same time each day.   THN CM Short Term Goal #2 (0-30 days)  Patient will  report medication adherence within the next 30 days   THN CM Short Term Goal #2  Start Date  10/29/15 [not met/ patient is not taking her fluid pill as ordered. per patient she is going to the bathroom all night. Patient is unable to leave house due to incontinence]   Interventions for Short Term Goal #2  Rn discussed with patient the importance of taking medication as per Dr ordered. RN will follow up with patient of adherence of medication   THN CM Short Term Goal #3 (0-30 days)  Patient will verbalize eating heart health food and snacks within 30 days   THN CM Short Term Goal #3 Start Date  10/29/15 [continue process. Patient is doing better]   Interventions for Short Tern Goal #3  RN sent patient a list of healthy snacks.  RN sent patient eduational material on a heart healthy diet. RN will continue to follow up with patient        Assessment: Patient is becoming more home bound due to incontinence Patient is getting up later in the day due to lack of sleep Patient is not adhering to taking her medications as prescribed    Plan:  RN sent patient educational material on Peripheral edema RN sent patient educational material on Urinary Tract Infection RN sent patient educational material on Urine culture and sensitivity RN sent patient educational material on Urinary Incontinence  RN sent patient educational material on  Adult overactive bladder RN recommended patient talk with her PCP regarding a referral to a urologist  [facility] [facility] [phone]

## 2015-10-31 ENCOUNTER — Other Ambulatory Visit: Payer: Self-pay

## 2015-10-31 MED ORDER — LANCETS MISC: Status: DC

## 2015-10-31 MED ORDER — GLUCOSE BLOOD VI STRP: ORAL_STRIP | Status: DC

## 2015-10-31 NOTE — Progress Notes (Signed)
Remote pacemaker transmission.   

## 2015-11-09 ENCOUNTER — Other Ambulatory Visit: Payer: Self-pay | Admitting: *Deleted

## 2015-11-09 NOTE — Patient Outreach (Signed)
[place] [facility]) Care Management  11/09/2015  SEANNA DAISEY 1945/11/25 [id number]   Phone call to patient to follow up on wheelchair ramp being built.  Phone rang, however no message able to be left.  Plan:  This social worker will follow up with patient on Monday.   [name] [facility] Care Management [phone]

## 2015-11-11 ENCOUNTER — Other Ambulatory Visit: Payer: Self-pay | Admitting: Internal Medicine

## 2015-11-12 ENCOUNTER — Other Ambulatory Visit: Payer: Self-pay | Admitting: *Deleted

## 2015-11-12 NOTE — Patient Outreach (Signed)
[place] [facility]) Care Management  11/12/2015  Debra Saunders 03-04-1946 [id number]   Phone call to patient to follow up on the status of the wheelchair ramp.  Per patient,  [employer] came to complete the measurements for the ramp and told her that the labor was free, however the materials would be $1000.00.  Per patient, she cannot afford this.  Cindy from [employer] will call patient back to confirm cost of the materials for the ramp.   Debra Saunders [facility] Care Management [phone]

## 2015-11-14 ENCOUNTER — Other Ambulatory Visit: Payer: Self-pay | Admitting: *Deleted

## 2015-11-14 DIAGNOSIS — I509 Heart failure, unspecified: Secondary | ICD-10-CM | POA: Diagnosis not present

## 2015-11-14 NOTE — Patient Outreach (Signed)
[place] [facility]) Care Management  11/14/2015  Debra Saunders [birth year] [id number]   Phone call to patient to follow up on wheelchair ramp.  Per patient [employer] [facility] has not contacted patient yet regarding the cost for the materials.  Plan:  This Education officer, museum will follow up with patient next week.   [name] [facility] Care Management [phone]

## 2015-11-15 ENCOUNTER — Other Ambulatory Visit: Payer: Self-pay | Admitting: Pharmacist

## 2015-11-15 NOTE — Patient Outreach (Signed)
[place] [facility]) Care Management  11/15/2015  Debra Saunders 12/30/1945 [id number]  Attempted to reach patient to follow up with her.  No answer, left a HIPAA compliant voicemail.    Plan:  Will await return call from patient or attempt to reach patient with in the next week.    Karrie Meres, PharmD, [place] [phone]

## 2015-11-19 DIAGNOSIS — G4733 Obstructive sleep apnea (adult) (pediatric): Secondary | ICD-10-CM | POA: Diagnosis not present

## 2015-11-20 ENCOUNTER — Encounter: Payer: Self-pay | Admitting: *Deleted

## 2015-11-20 ENCOUNTER — Other Ambulatory Visit: Payer: Self-pay | Admitting: *Deleted

## 2015-11-20 NOTE — Patient Outreach (Signed)
[place] [facility]) Care Management  11/20/2015  Debra Saunders March 04, 1946 [id number]   Phone call received from patient stating that she received a call from [facility] and they have agreed to install her wheelchair ramp on Saturday, 11/24/15.  Patient verbalized having no further social work needs.  Case to be closed to social work at this time.   [name] [facility] Care Management [phone]

## 2015-11-21 ENCOUNTER — Ambulatory Visit (INDEPENDENT_AMBULATORY_CARE_PROVIDER_SITE_OTHER): Payer: Commercial Managed Care - HMO | Admitting: Internal Medicine

## 2015-11-21 ENCOUNTER — Encounter: Payer: Self-pay | Admitting: Internal Medicine

## 2015-11-21 VITALS — BP 124/78 | HR 81 | Ht 61.0 in | Wt 237.4 lb

## 2015-11-21 DIAGNOSIS — R0789 Other chest pain: Secondary | ICD-10-CM | POA: Diagnosis not present

## 2015-11-21 DIAGNOSIS — M549 Dorsalgia, unspecified: Secondary | ICD-10-CM | POA: Diagnosis not present

## 2015-11-21 DIAGNOSIS — E118 Type 2 diabetes mellitus with unspecified complications: Secondary | ICD-10-CM | POA: Diagnosis not present

## 2015-11-21 DIAGNOSIS — I10 Essential (primary) hypertension: Secondary | ICD-10-CM | POA: Diagnosis not present

## 2015-11-21 DIAGNOSIS — Z794 Long term (current) use of insulin: Secondary | ICD-10-CM | POA: Diagnosis not present

## 2015-11-21 DIAGNOSIS — M25562 Pain in left knee: Secondary | ICD-10-CM | POA: Diagnosis not present

## 2015-11-21 DIAGNOSIS — J9611 Chronic respiratory failure with hypoxia: Secondary | ICD-10-CM

## 2015-11-21 DIAGNOSIS — N644 Mastodynia: Secondary | ICD-10-CM | POA: Insufficient documentation

## 2015-11-21 DIAGNOSIS — G8929 Other chronic pain: Secondary | ICD-10-CM

## 2015-11-21 NOTE — Assessment & Plan Note (Addendum)
Right chest wall pain. Severe last night. Likely related to chronic chest wall pain which has been present since previous CPR several years ago. Exam today normal. EKG showed no acute changes. Will monitor for now. Continue prn Oxycodone for severe pain.

## 2015-11-21 NOTE — Assessment & Plan Note (Signed)
BP Readings from Last 3 Encounters:  11/21/15 124/78  10/19/15 134/68  09/05/15 143/70   BP well controlled. Renal function with labs today. Continue current medication.

## 2015-11-21 NOTE — Assessment & Plan Note (Signed)
Symptomatically relatively stable. Continue supplemental oxygen.

## 2015-11-21 NOTE — Patient Instructions (Addendum)
Labs today.  We will set up an evaluation with orthopedics to help address left knee pain.  We will also schedule a mammogram to help identify the cause of your breast pain.  Please seek care immediately if you have recurrent severe chest pain.

## 2015-11-21 NOTE — Assessment & Plan Note (Signed)
Left knee pain secondary to OA. Will set up ortho evaluation. Question if she might benefit from Synvisc, given DM and potential risks of repeat steroid use.

## 2015-11-21 NOTE — Assessment & Plan Note (Signed)
BG generally well controlled. A1c with labs. Continue current medication.

## 2015-11-21 NOTE — Assessment & Plan Note (Signed)
Right breast pain. No palpable abnormalities on exam. Will set up mammogram for further evaluation.

## 2015-11-21 NOTE — Assessment & Plan Note (Signed)
Chronic back pain secondary to DJD. Encouraged follow up with Dr. Sharlet [name].

## 2015-11-21 NOTE — Progress Notes (Signed)
Subjective:    Patient ID: Debra Saunders, female    DOB: 11/30/1945, 70 y.o.   MRN: [id number]  HPI  70YO female presents for follow up.  Recently seen for chronic back pain and diabetes. Has not yet scheduled evaluation with Dr. Sharlet [name]. Back pain has been slightly improved.  Left knee pain has been worse than previous. No recent injuries. Feels that knee may give out on her. Pain comes and goes. Worse with prolonged standing.   Blood sugars have been up and down. Mornings near 250. Taking Levemir at night 20 units. In afternoon BG near 160s-190s.  Chronic hypoxia - Breathing has been "good." No issues.  Last night, had severe chest pain. Reports some dull pain all week, then much worse with doing laundry last night. Located right arm and shoulder. Radiates up neck. Felt short of breath at times. Described as sore and stabbing pain. Continues to have some soreness in the area. Not taking anything for this.  Lab Results  Component Value Date   HGBA1C 6.5 08/06/2015     Wt Readings from Last 3 Encounters:  11/21/15 237 lb 6.4 oz (107.684 kg)  10/19/15 239 lb (108.41 kg)  09/05/15 238 lb 8 oz (108.183 kg)   BP Readings from Last 3 Encounters:  11/21/15 124/78  10/19/15 134/68  09/05/15 143/70    Past Medical History  Diagnosis Date  . Thyroid disorder   . Diabetes mellitus ([place])     type II  . Thyroid nodule   . Sleep apnea   . Obesity   . Hypertension   . Hyperlipidemia   . Urinary incontinence   . CHF (congestive heart failure) ([place]) 2005    Dx at [facility]  . Chronic back pain   . COPD (chronic obstructive pulmonary disease) ([place])     2L [place]  . Sleep apnea   . Lipoma of back   . Arthritis   . Streptococcal bacteremia   . Acute renal failure ([place])   . Stroke ([place])   . Sepsis with metabolic encephalopathy ([place])   . Gastritis   . Symptomatic bradycardia   . Chronic respiratory failure (HCC)     a. on home O2   Family History  Problem Relation Age of Onset   . Heart disease Mother   . Hypertension Mother   . COPD Mother     was a smoker  . Thyroid disease Mother   . Hypertension Father   . Hypertension Sister   . Thyroid disease Sister   . Hypertension Sister   . Thyroid disease Sister   . Breast cancer Maternal Aunt    Past Surgical History  Procedure Laterality Date  . Cholecystectomy    . Vaginal hysterectomy    . Cardiac catheterization  2013    @ [place]: No obstructive CAD: Only 20% ostial left circumflex.  . Abdominal hysterectomy  1969  . Cyst removal trunk  2002    BACK  . Lipoma removal  2014    back  . Pacemaker insertion  2015    [place] dual chamber pacemaker implanted by Dr Caryl Comes for symptomatic bradycardia  . Insert / replace / remove pacemaker    . Permanent pacemaker insertion N/A 01/09/2014    Procedure: PERMANENT PACEMAKER INSERTION;  Surgeon: Deboraha Sprang, MD;  Location: [facility] CATH LAB;  Service: Cardiovascular;  Laterality: N/A;  . Trigger finger release     Social History   Social History  . Marital Status: Divorced  Spouse Name: N/A  . Number of Children: N/A  . Years of Education: N/A   Occupational History  . Retired- factory work     exposed to [place]  . Smoking status: Former Smoker -- 0.30 packs/day for 2 years    Types: Cigarettes    Quit date: 06/23/1990  . Smokeless tobacco: Never Used  . Alcohol Use: No  . Drug Use: No  . Sexual Activity: Not Asked   Other Topics Concern  . None   Social History Narrative   Lives in [place] with daughter. Has 4 children. No pets.      Work - Restaurant manager, fast food, retired      Diet - regular    Review of Systems  Constitutional: Positive for fatigue. Negative for fever, chills, appetite change and unexpected weight change.  Eyes: Negative for visual disturbance.  Respiratory: Positive for shortness of breath (chronic). Negative for cough, chest tightness and wheezing.   Cardiovascular: Negative for chest  pain and leg swelling.  Gastrointestinal: Negative for nausea, vomiting, abdominal pain, diarrhea and constipation.  Musculoskeletal: Positive for myalgias, back pain, joint swelling and arthralgias.  Skin: Negative for color change and rash.  Neurological: Negative for weakness.  Hematological: Negative for adenopathy. Does not bruise/bleed easily.  Psychiatric/Behavioral: Positive for sleep disturbance. Negative for suicidal ideas and dysphoric mood. The patient is nervous/anxious.        Objective:    BP 124/78 mmHg  Pulse 81  Ht 5\' 1"  (1.549 m)  Wt 237 lb 6.4 oz (107.684 kg)  BMI 44.88 kg/m2  SpO2 94% Physical Exam  Constitutional: She is oriented to person, place, and time. She appears well-developed and well-nourished. No distress.  HENT:  Head: Normocephalic and atraumatic.  Right Ear: External ear normal.  Left Ear: External ear normal.  Nose: Nose normal.  Mouth/Throat: Oropharynx is clear and moist. No oropharyngeal exudate.  Eyes: Conjunctivae are normal. Pupils are equal, round, and reactive to light. Right eye exhibits no discharge. Left eye exhibits no discharge. No scleral icterus.  Neck: Normal range of motion. Neck supple. No tracheal deviation present. No thyromegaly present.  Cardiovascular: Normal rate, regular rhythm, normal heart sounds and intact distal pulses.  Exam reveals no gallop and no friction rub.   No murmur heard. Pulmonary/Chest: Effort normal and breath sounds normal. No accessory muscle usage. No respiratory distress. She has no decreased breath sounds. She has no wheezes. She has no rhonchi. She has no rales. She exhibits no tenderness. Right breast exhibits tenderness. Right breast exhibits no inverted nipple, no mass, no nipple discharge and no skin change. Left breast exhibits no inverted nipple, no mass, no nipple discharge and no skin change. Breasts are symmetrical.    Musculoskeletal: Normal range of motion. She exhibits no edema or  tenderness.  Lymphadenopathy:    She has no cervical adenopathy.  Neurological: She is alert and oriented to person, place, and time. No cranial nerve deficit. She exhibits normal muscle tone. Coordination normal.  Skin: Skin is warm and dry. No rash noted. She is not diaphoretic. No erythema. No pallor.  Psychiatric: She has a normal mood and affect. Her behavior is normal. Judgment and thought content normal.          Assessment & Plan:  Over 51min of which >50% spent in face-to-face contact with patient discussing plan of care  Problem List Items Addressed This Visit      Unprioritized   Breast pain  Right breast pain. No palpable abnormalities on exam. Will set up mammogram for further evaluation.      Relevant Orders   MM Digital Diagnostic Bilat   Chest wall pain    Right chest wall pain. Severe last night. Likely related to chronic chest wall pain which has been present since previous CPR several years ago. Exam today normal. EKG showed no acute changes. Will monitor for now. Continue prn Oxycodone for severe pain.      Relevant Orders   EKG 12-Lead (Completed)   Chronic back pain (Chronic)    Chronic back pain secondary to DJD. Encouraged follow up with Dr. Sharlet [name].      Chronic respiratory failure with hypoxia (HCC) (Chronic)    Symptomatically relatively stable. Continue supplemental oxygen.       Diabetes mellitus type 2, controlled ([place]) - Primary (Chronic)    BG generally well controlled. A1c with labs. Continue current medication.      Relevant Orders   Comprehensive metabolic panel   Hemoglobin A1c   Hypertension (Chronic)    BP Readings from Last 3 Encounters:  11/21/15 124/78  10/19/15 134/68  09/05/15 143/70   BP well controlled. Renal function with labs today. Continue current medication.      Left knee pain    Left knee pain secondary to OA. Will set up ortho evaluation. Question if she might benefit from Synvisc, given DM and potential  risks of repeat steroid use.      Relevant Orders   Ambulatory referral to Orthopedic Surgery       Return in about 4 weeks (around 12/19/2015) for Recheck.  Ronette Deter, MD Internal Medicine [facility]

## 2015-11-22 ENCOUNTER — Other Ambulatory Visit: Payer: Self-pay | Admitting: Pharmacist

## 2015-11-22 LAB — COMPREHENSIVE METABOLIC PANEL
ALT: 40 U/L — ABNORMAL HIGH (ref 0–35)
AST: 48 U/L — ABNORMAL HIGH (ref 0–37)
Albumin: 3.8 g/dL (ref 3.5–5.2)
Alkaline Phosphatase: 129 U/L — ABNORMAL HIGH (ref 39–117)
BUN: 12 mg/dL (ref 6–23)
CO2: 30 mEq/L (ref 19–32)
Calcium: 9.5 mg/dL (ref 8.4–10.5)
Chloride: 101 mEq/L (ref 96–112)
Creatinine, Ser: 0.73 mg/dL (ref 0.40–1.20)
GFR: 100.67 mL/min (ref >60.00)
Glucose, Bld: 155 mg/dL — ABNORMAL HIGH (ref 70–99)
Potassium: 3.8 mEq/L (ref 3.5–5.1)
Sodium: 138 mEq/L (ref 135–145)
Total Bilirubin: 0.4 mg/dL (ref 0.2–1.2)
Total Protein: 7.6 g/dL (ref 6.0–8.3)

## 2015-11-22 LAB — HEMOGLOBIN A1C: Hgb A1c MFr Bld: 7.2 % — ABNORMAL HIGH (ref 4.6–6.5)

## 2015-11-22 NOTE — Patient Outreach (Signed)
[facility]) Care Management  11/22/2015  Debra Saunders January 27, 1946 [id number]  Placed follow-up call to patient to see if she had any further medication questions.  Referral was initially regarding torsemide and patient report of going to the bathroom frequently per referral from [place], [place].    Had sent note to patient's PCP and patient reports her PCP and patient discussed her diuretics.    Discussed with patient again about taking diuretics in the morning and trying to not take the evening dose late in the afternoon, perhaps by 1700.  Patient is aware of importance of diuretics in helping to keep patient from becoming fluid overloaded.    Plan:  Will send message to [place] of pharmacy case closure.  Patient does not want a pharmacist home visit to review her medications at this time and denies other pharmacy needs.    Would be happy to assist patient if needed in the future with pharmacy needs, she has pharmacist phone number.   Karrie Meres, PharmD, [place] [phone]

## 2015-11-26 DIAGNOSIS — J9611 Chronic respiratory failure with hypoxia: Secondary | ICD-10-CM | POA: Diagnosis not present

## 2015-11-27 ENCOUNTER — Ambulatory Visit: Payer: Commercial Managed Care - HMO | Admitting: Cardiovascular Disease

## 2015-11-27 ENCOUNTER — Other Ambulatory Visit: Payer: Self-pay | Admitting: *Deleted

## 2015-11-27 NOTE — Patient Outreach (Signed)
[facility]) Care Management  11/27/2015  TODD GAMBEL 08/02/1945 [id number]  RN Health Coach  attempted  Follow up outreach call #1 to patient.  Patient was unavailable.Phone line was busy. No voicemail pick up  Plan: RN will follow up again within 14 days  [place] Management [phone]

## 2015-11-28 ENCOUNTER — Ambulatory Visit: Payer: Commercial Managed Care - HMO | Admitting: *Deleted

## 2015-11-29 ENCOUNTER — Other Ambulatory Visit: Payer: Self-pay | Admitting: Internal Medicine

## 2015-12-05 ENCOUNTER — Encounter: Payer: Self-pay | Admitting: Cardiology

## 2015-12-05 ENCOUNTER — Other Ambulatory Visit: Payer: Self-pay | Admitting: *Deleted

## 2015-12-05 NOTE — Patient Outreach (Signed)
[name]) Care Management  [place]  12/06/2015   Debra Saunders 22-Mar-1946 [id number]  [place] telephone call to patient.  [place] compliance verified. Per patient she still is having some swelling in her lower extremities but she has lost 2 pounds. Patient is not having any shortness of breath.  Per patient she is not been eating regular meals. Patient does not drink any supplemental nutrition.  Patient blood sugar was up to 168  this am fasting. Patient A1C on 11/21/2015 is 7.2. On 08/06/2015 her A1C was 6.5. Per patient she likes to eat watermelon for a meal. Patient eats breakfast but not much more the rest of the day. RN explained to patient that when she is not eating regular meals her metabolism slow down and stores the food she takes in because its not sure when she will eat again.  Patient understands that she needs to eat but stated she does not have much appetite.  Patient still not getting out much due to the incontinence. . Explained to patient that the watermelon is water and also increases her urine output .Per patient her knee is hurting, she has been having chest discomfort, and the urologist and all that has a co pay so she is going for the mammogram 1st then the urologist next month.Patient has agreed to next follow outreach     Objective:   Encounter Medications:  Outpatient Encounter Prescriptions as of 12/05/2015  Medication Sig Note  . acetaminophen (TYLENOL) 325 MG tablet Take 325-650 mg by mouth every 6 (six) hours as needed for mild pain. Reported on 10/10/2015   . albuterol (PROVENTIL) (2.5 MG/3ML) 0.083% nebulizer solution Take 3 mLs (2.5 mg total) by nebulization every 6 (six) hours as needed for wheezing or shortness of breath.   [name] ambrisentan (LETAIRIS) 5 MG tablet Take 1 tablet (5 mg total) by mouth daily.   . cyclobenzaprine (FLEXERIL) 5 MG tablet Take 1 tablet (5 mg total) by mouth 3 (three) times daily as needed for  muscle spasms. 10/10/2015: Patients reports usually takes twice daily.    [name] gabapentin (NEURONTIN) 100 MG capsule Take 200 mg by mouth 3 (three) times daily. 07/31/2015: Wants to ask MD if she needs to stay on this   . glipiZIDE (GLUCOTROL) 5 MG tablet Take 0.5 tablets (2.5 mg total) by mouth daily before breakfast.   . glucose blood test strip Use as instructed to check blood glucose three times a day. e11.8   . insulin detemir (LEVEMIR) 100 UNIT/ML injection Inject 0.18 mLs (18 Units total) into the skin at bedtime. (Patient taking differently: Inject 20 Units into the skin at bedtime. ) 10/10/2015: Patient reports using 20 units daily now  . KLOR-CON M20 20 MEQ tablet TAKE ONE TABLET BY MOUTH TWICE DAILY   . Lancets MISC Use as directed to check blood sugar three times daily e11.8   . losartan (COZAAR) 50 MG tablet Take 1 tablet (50 mg total) by mouth daily. 07/31/2015: New medication started by Dr. Caryl Comes, 07/31/15  . meclizine (ANTIVERT) 25 MG tablet Take 25 mg by mouth daily as needed for dizziness. Reported on 10/10/2015   . Menthol, Topical Analgesic, (BIOFREEZE EX) Apply 1 application topically as needed (for pain).    . naproxen (EC NAPROSYN) 500 MG EC tablet Take 500 mg by mouth 2 (two) times daily as needed (for pain). Reported on 10/10/2015 10/10/2015: Patient has not used this month  . ondansetron (ZOFRAN) 8 MG tablet Take  1 tablet (8 mg total) by mouth every 8 (eight) hours as needed for nausea or vomiting. 07/31/2015: Only takes as needed  . oxyCODONE-acetaminophen (PERCOCET/ROXICET) 5-325 MG tablet Take 1 tablet by mouth every 8 (eight) hours as needed for severe pain. 07/31/2015: Has been taking qd since hurt back a few days ago   . polyethylene glycol (MIRALAX / GLYCOLAX) packet Take 17 g by mouth daily as needed for moderate constipation. 05/31/2015: Needing it about every day  . promethazine (PHENERGAN) 25 MG tablet Take 25 mg by mouth every 4 (four) hours as needed for nausea or vomiting. Reported  on 10/10/2015   . torsemide (DEMADEX) 20 MG tablet Take 1 tablet (20 mg total) by mouth 2 (two) times daily. 10/30/2015: Patient reported taking one tablet and sometimes even skips a day  . verapamil (VERELAN PM) 240 MG 24 hr capsule TAKE ONE TABLET BY MOUTH ONCE DAILY    No facility-administered encounter medications on file as of 12/05/2015.    Functional Status:  In your present state of health, do you have any difficulty performing the following activities: 12/05/2015 10/29/2015  Hearing? N N  Vision? N N  Difficulty concentrating or making decisions? N N  Walking or climbing stairs? Y Y  Dressing or bathing? Y Y  Doing errands, shopping? [name]  Preparing Food and eating ? Y Y  Using the Toilet? Y Y  In the past six months, have you accidently leaked urine? Y Y  Do you have problems with loss of bowel control? N N  Managing your Medications? N N  Managing your Finances? N N  Housekeeping or managing your Housekeeping? [name]    Fall/Depression Screening: PHQ 2/9 Scores 12/05/2015 11/21/2015 10/29/2015 09/28/2015 08/31/2015 08/31/2015 05/31/2015  PHQ - 2 Score 0 0 0 0 0 0 0   THN CM Care Plan Problem One        Most Recent Value   Care Plan Problem One  Knowledge deficit in self management of congestive heart failure   Role Documenting the Problem One  [place] for Problem One  Active   THN Long Term Goal (31-90 days)  Patient will continue to not have any admissions for CHF within the next 90 days   THN Long Term Goal Start Date  09/28/15   Interventions for Problem One [place] reminded patient to keep appointments with primary care physician. RN Health Coach will monitor patient monthly telephonic. RN reminded patient the importance of taking medications as per physician ordered   [facility] CM Short Term Goal #1 (0-30 days)  Patient will weigh daily and record in log within the next 30 days   THN CM Short Term Goal #1 Start Date  10/29/15 [continued]    Interventions for Short Term Goal #1  [place] discussed the importance of weight gain of 3 pounds or more pounds in a day or 5 pounds in one week. Patient will notify doctor. RN Health Coach discussed the importance of weighing the same time each day.   THN CM Short Term Goal #2 (0-30 days)  Patient will  report medication adherence within the next 30 days   THN CM Short Term Goal #2 Start Date  10/29/15 [not met/ patient is not taking her fluid pill as ordered. per patient she is going to the bathroom all night. Patient is unable to leave house due to incontinence]   Interventions for Short Term Goal #  2  Rn discussed with patient the importance of taking medication as per Dr ordered. RN will follow up with patient of adherence of medication   THN CM Short Term Goal #3 (0-30 days)  Patient will verbalize eating heart health food and snacks within 30 days   THN CM Short Term Goal #3 Start Date  10/29/15 [continue process. Patient is doing better]   Interventions for Short Tern Goal #3  RN sent patient a list of healthy snacks.  RN sent patient eduational material on a heart healthy diet. RN will continue to follow up with patient       Assessment:  Appetite poor Patient has not seen urologist Patient is not eating additional healthy snacks between meals and at [place]:  RN sent additional information on Healthy Heart Diet RN sent educational information on Dash Diet with samples RN discussed with patient a healthy diet with small snacks help to increase her metabolism RN reiterated the zones of congestive Heart Failure RN sent educational material on HGB A1C and what it means and how it affects your blood sugar.  [place] Care Management [phone]

## 2015-12-06 ENCOUNTER — Encounter: Payer: Self-pay | Admitting: *Deleted

## 2015-12-06 LAB — CUP PACEART REMOTE DEVICE CHECK
Battery Remaining Longevity: 150 mo
Battery Remaining Percentage: 100 %
Brady Statistic RA Percent Paced: 27 %
Brady Statistic RV Percent Paced: 0 %
Date Time Interrogation Session: 20170509072100
Implantable Lead Implant Date: 20150720
Implantable Lead Implant Date: 20150720
Implantable Lead Location: 753859
Implantable Lead Location: 753860
Implantable Lead Model: 5076
Implantable Lead Model: 5076
Lead Channel Impedance Value: 500 Ohm
Lead Channel Impedance Value: 613 Ohm
Lead Channel Pacing Threshold Amplitude: 0.7 V
Lead Channel Pacing Threshold Pulse Width: 0.4 ms
Lead Channel Setting Pacing Amplitude: 2 V
Lead Channel Setting Pacing Amplitude: 2.4 V
Lead Channel Setting Pacing Pulse Width: 0.4 ms
Lead Channel Setting Sensing Sensitivity: 2.5 mV
Pulse Gen Serial Number: 390330

## 2015-12-15 DIAGNOSIS — I509 Heart failure, unspecified: Secondary | ICD-10-CM | POA: Diagnosis not present

## 2015-12-18 DIAGNOSIS — R922 Inconclusive mammogram: Secondary | ICD-10-CM | POA: Diagnosis not present

## 2015-12-18 DIAGNOSIS — Z950 Presence of cardiac pacemaker: Secondary | ICD-10-CM | POA: Diagnosis not present

## 2015-12-18 DIAGNOSIS — N644 Mastodynia: Secondary | ICD-10-CM | POA: Diagnosis not present

## 2015-12-19 ENCOUNTER — Other Ambulatory Visit (INDEPENDENT_AMBULATORY_CARE_PROVIDER_SITE_OTHER): Payer: Commercial Managed Care - HMO

## 2015-12-19 DIAGNOSIS — E118 Type 2 diabetes mellitus with unspecified complications: Secondary | ICD-10-CM | POA: Diagnosis not present

## 2015-12-19 DIAGNOSIS — Z794 Long term (current) use of insulin: Secondary | ICD-10-CM | POA: Diagnosis not present

## 2015-12-20 DIAGNOSIS — G4733 Obstructive sleep apnea (adult) (pediatric): Secondary | ICD-10-CM | POA: Diagnosis not present

## 2015-12-20 LAB — COMPREHENSIVE METABOLIC PANEL
ALT: 31 U/L (ref 0–35)
AST: 42 U/L — ABNORMAL HIGH (ref 0–37)
Albumin: 3.7 g/dL (ref 3.5–5.2)
Alkaline Phosphatase: 119 U/L — ABNORMAL HIGH (ref 39–117)
BUN: 14 mg/dL (ref 6–23)
CO2: 30 mEq/L (ref 19–32)
Calcium: 9.4 mg/dL (ref 8.4–10.5)
Chloride: 103 mEq/L (ref 96–112)
Creatinine, Ser: 0.83 mg/dL (ref 0.40–1.20)
GFR: 86.79 mL/min (ref >60.00)
Glucose, Bld: 163 mg/dL — ABNORMAL HIGH (ref 70–99)
Potassium: 3.9 mEq/L (ref 3.5–5.1)
Sodium: 139 mEq/L (ref 135–145)
Total Bilirubin: 0.4 mg/dL (ref 0.2–1.2)
Total Protein: 7.9 g/dL (ref 6.0–8.3)

## 2015-12-23 ENCOUNTER — Other Ambulatory Visit: Payer: Self-pay | Admitting: Internal Medicine

## 2015-12-26 DIAGNOSIS — J9611 Chronic respiratory failure with hypoxia: Secondary | ICD-10-CM | POA: Diagnosis not present

## 2015-12-28 ENCOUNTER — Ambulatory Visit (INDEPENDENT_AMBULATORY_CARE_PROVIDER_SITE_OTHER): Payer: Commercial Managed Care - HMO | Admitting: Cardiovascular Disease

## 2015-12-28 ENCOUNTER — Encounter: Payer: Self-pay | Admitting: Cardiovascular Disease

## 2015-12-28 VITALS — BP 118/70 | HR 83 | Ht 61.0 in | Wt 236.8 lb

## 2015-12-28 DIAGNOSIS — I10 Essential (primary) hypertension: Secondary | ICD-10-CM | POA: Diagnosis not present

## 2015-12-28 DIAGNOSIS — I495 Sick sinus syndrome: Secondary | ICD-10-CM | POA: Diagnosis not present

## 2015-12-28 DIAGNOSIS — I5032 Chronic diastolic (congestive) heart failure: Secondary | ICD-10-CM

## 2015-12-28 NOTE — Progress Notes (Signed)
Cardiology Office Note   Date:  12/28/2015   ID:  Debra Saunders, DOB Dec 11, 1945, MRN [id number]  PCP:  [name], MD  Cardiologist:   [name], MD   Chief Complaint  Patient presents with  . Follow-up  . Shortness of Breath  . Edema      History of Present Illness: Debra Saunders is a 70 y.o. female who presents for A follow-up visit regarding chronic diastolic heart failure with pulmonary hypertension, chronic atypical musculoskeletal chest pain and previous bradycardia status post dual-chamber pacemaker placement. She has chronic medical conditions that include diabetes mellitus, hypertension and obesity. She also has chronic left leg lymphedema. Previous cardiac catheterization in August of 2013 showed no evidence of obstructive coronary artery disease. She was noted to be severely hypertensive with significant elevation in left ventricular end-diastolic pressure.  She had prior subarachnoid hemorrhage with cardiac pulmonary arrest in July 2014 in the setting of anticoagulation for suspected pulmonary embolism.  She underwent a dual-chamber pacemaker placement by Dr. Caryl Comes in [phone] for symptomatic bradycardia.  He has been doing reasonably well and denies any chest pain at the present time. Exertional dyspnea is stable. She did have what sounds to be musculoskeletal chest pain about one month ago but that has resolved. She has been taking her medications regularly. Losartan was added by Dr. Caryl Comes with subsequent improvement in blood pressure. She was also noted to have brief episodes of atrial fibrillation on device interrogation that she is not felt to be a good anticoagulation candidate given prior history of intracranial hemorrhage with anticoagulation.    Past Medical History  Diagnosis Date  . Thyroid disorder   . Diabetes mellitus ([place])     type II  . Thyroid nodule   . Sleep apnea   . Obesity   . Hypertension   . Hyperlipidemia   . Urinary  incontinence   . CHF (congestive heart failure) ([place]) 2005    Dx at [facility]  . Chronic back pain   . COPD (chronic obstructive pulmonary disease) ([place])     2L [place]  . Sleep apnea   . Lipoma of back   . Arthritis   . Streptococcal bacteremia   . Acute renal failure ([place])   . Stroke ([place])   . Sepsis with metabolic encephalopathy ([place])   . Gastritis   . Symptomatic bradycardia   . Chronic respiratory failure (HCC)     a. on home O2    Past Surgical History  Procedure Laterality Date  . Cholecystectomy    . Vaginal hysterectomy    . Cardiac catheterization  2013    @ [place]: No obstructive CAD: Only 20% ostial left circumflex.  . Abdominal hysterectomy  1969  . Cyst removal trunk  2002    BACK  . Lipoma removal  2014    back  . Pacemaker insertion  2015    [place] dual chamber pacemaker implanted by Dr Caryl Comes for symptomatic bradycardia  . Insert / replace / remove pacemaker    . Permanent pacemaker insertion N/A 01/09/2014    Procedure: PERMANENT PACEMAKER INSERTION;  Surgeon: Deboraha Sprang, MD;  Location: [facility] CATH LAB;  Service: Cardiovascular;  Laterality: N/A;  . Trigger finger release       Current Outpatient Prescriptions  Medication Sig Dispense Refill  . acetaminophen (TYLENOL) 325 MG tablet Take 325-650 mg by mouth every 6 (six) hours as needed for mild pain. Reported on 10/10/2015    . albuterol (  PROVENTIL) (2.5 MG/3ML) 0.083% nebulizer solution Take 3 mLs (2.5 mg total) by nebulization every 6 (six) hours as needed for wheezing or shortness of breath. 75 mL 12  . ambrisentan (LETAIRIS) 5 MG tablet Take 1 tablet (5 mg total) by mouth daily. 30 tablet 5  . cyclobenzaprine (FLEXERIL) 5 MG tablet Take 1 tablet (5 mg total) by mouth 3 (three) times daily as needed for muscle spasms. 90 tablet 2  . EASY TOUCH INSULIN SYRINGE 29G X 1/2" 1 ML MISC USE AS DIRECTED WITH  LEVEMIR  INSULIN 100 each 0  . gabapentin (NEURONTIN) 100 MG capsule Take 200 mg by mouth 3 (three)  times daily.    [name] glipiZIDE (GLUCOTROL) 5 MG tablet Take 0.5 tablets (2.5 mg total) by mouth daily before breakfast. 60 tablet 3  . glucose blood test strip Use as instructed to check blood glucose three times a day. e11.8 100 each 12  . insulin detemir (LEVEMIR) 100 UNIT/ML injection Inject 0.18 mLs (18 Units total) into the skin at bedtime. (Patient taking differently: Inject 20 Units into the skin at bedtime. ) 10 mL 5  . KLOR-CON M20 20 MEQ tablet TAKE ONE TABLET BY MOUTH TWICE DAILY 60 tablet 0  . Lancets MISC Use as directed to check blood sugar three times daily e11.8 100 each 11  . losartan (COZAAR) 50 MG tablet Take 1 tablet (50 mg total) by mouth daily. 30 tablet 11  . meclizine (ANTIVERT) 25 MG tablet Take 25 mg by mouth daily as needed for dizziness. Reported on 10/10/2015    . Menthol, Topical Analgesic, (BIOFREEZE EX) Apply 1 application topically as needed (for pain).     . naproxen (EC NAPROSYN) 500 MG EC tablet Take 500 mg by mouth 2 (two) times daily as needed (for pain). Reported on 10/10/2015    . ondansetron (ZOFRAN) 8 MG tablet Take 1 tablet (8 mg total) by mouth every 8 (eight) hours as needed for nausea or vomiting. 30 tablet 2  . oxyCODONE-acetaminophen (PERCOCET/ROXICET) 5-325 MG tablet Take 1 tablet by mouth every 8 (eight) hours as needed for severe pain. 30 tablet 0  . polyethylene glycol (MIRALAX / GLYCOLAX) packet Take 17 g by mouth daily as needed for moderate constipation.    . promethazine (PHENERGAN) 25 MG tablet Take 25 mg by mouth every 4 (four) hours as needed for nausea or vomiting. Reported on 10/10/2015    . torsemide (DEMADEX) 20 MG tablet Take 1 tablet (20 mg total) by mouth 2 (two) times daily. 60 tablet 6  . verapamil (VERELAN PM) 240 MG 24 hr capsule TAKE ONE TABLET BY MOUTH ONCE DAILY 90 capsule 0   No current facility-administered medications for this visit.    Allergies:   Penicillins; Aspirin; and Other    Social History:  The patient  reports  that she quit smoking about 25 years ago. Her smoking use included Cigarettes. She has a .6 pack-year smoking history. She has never used smokeless tobacco. She reports that she does not drink alcohol or use illicit drugs.   Family History:  The patient's family history includes Breast cancer in her maternal aunt; COPD in her mother; Heart disease in her mother; Hypertension in her father, mother, sister, and sister; Thyroid disease in her mother, sister, and sister.    ROS:  Please see the history of present illness.   Otherwise, review of systems are positive for none.   All other systems are reviewed and negative.    PHYSICAL  EXAM: VS:  BP 118/70 mmHg  Pulse 83  Ht 5\' 1"  (1.549 m)  Wt 236 lb 12.8 oz (107.412 kg)  BMI 44.77 kg/m2  SpO2 97% , BMI Body mass index is 44.77 kg/(m^2). GEN: Well nourished, well developed, in no acute distress HEENT: normal Neck: no JVD, carotid bruits, or masses Cardiac: RRR; no  rubs, or gallops, one out of 6 systolic flow murmur in the aortic area. Chronic left leg edema. Respiratory:  clear to auscultation bilaterally, normal work of breathing GI: soft, nontender, nondistended, + BS MS: no deformity or atrophy Skin: warm and dry, no rash Neuro:  Strength and sensation are intact Psych: euthymic mood, full affect   EKG:  EKG is ordered today. The ekg ordered today demonstrates sinus rhythm with sinus arrhythmia, demand atrial pacemaker, PVC and nonspecific T wave changes.   Recent Labs: 03/30/2015: B Natriuretic Peptide 101.0* 04/01/2015: Magnesium 2.3 07/09/2015: Hemoglobin 11.6*; Platelets 227 12/19/2015: ALT 31; BUN 14; Creatinine, Ser 0.83; Potassium 3.9; Sodium 139    Lipid Panel    Component Value Date/Time   CHOL 160 03/28/2015 0415   CHOL 137 02/03/2013 0441   TRIG 107 03/28/2015 0415   TRIG 197 02/03/2013 0441   HDL 48 03/28/2015 0415   HDL 25* 02/03/2013 0441   CHOLHDL 3.3 03/28/2015 0415   VLDL 21 03/28/2015 0415   VLDL 39  02/03/2013 0441   LDLCALC 91 03/28/2015 0415   LDLCALC 73 02/03/2013 0441      Wt Readings from Last 3 Encounters:  12/28/15 236 lb 12.8 oz (107.412 kg)  11/21/15 237 lb 6.4 oz (107.684 kg)  10/19/15 239 lb (108.41 kg)        ASSESSMENT AND PLAN:  1.  Chronic diastolic heart failure with pulmonary hypertension: She is doing well overall from a cardiac standpoint and appears to be euvolemic on current dose of torsemide. Recent labs were reviewed and showed normal renal function.  2. Essential hypertension: Blood pressure is now under excellent control after the addition of losartan. She is also on verapamil.  3. Status post dual-chamber pacemaker placement: Followed by Dr. Caryl Comes.  4. Chronic musculoskeletal chest pain: She had one episode about one month ago but no recent symptoms. Continue conservative treatment.  Disposition:   FU with me in 9 months  Signed,  [name], MD  12/28/2015 3:53 PM    [facility]

## 2015-12-28 NOTE — Patient Instructions (Signed)
Medication Instructions: Continue same medications.   Labwork: None.   Procedures/Testing: None.   Follow-Up: 9 months with Dr. Fletcher Anon.   Any Additional Special Instructions Will Be Listed Below (If Applicable).     If you need a refill on your cardiac medications before your next appointment, please call your pharmacy.

## 2015-12-31 ENCOUNTER — Other Ambulatory Visit: Payer: Self-pay | Admitting: Internal Medicine

## 2016-01-02 ENCOUNTER — Other Ambulatory Visit: Payer: Self-pay | Admitting: Internal Medicine

## 2016-01-03 ENCOUNTER — Other Ambulatory Visit: Payer: Self-pay | Admitting: *Deleted

## 2016-01-03 NOTE — Patient Outreach (Signed)
[facility]) Care Management  01/03/2016  Debra Saunders [birth year] [id number]  RN Health Coach  attempted #1 Follow up outreach call to patient.  Patient was unavailable. HIPPA compliance voicemail message was left with return callback number.   Plan: RN will call patient again within 14 days.    [place] Care Management [phone]

## 2016-01-04 ENCOUNTER — Encounter: Payer: Self-pay | Admitting: *Deleted

## 2016-01-04 ENCOUNTER — Other Ambulatory Visit: Payer: Self-pay | Admitting: *Deleted

## 2016-01-04 NOTE — Patient Outreach (Signed)
[facility]) Care Management  Elida  01/04/2016   IMAGINE NEST 04/22/1946 [id number]  Subjective: RN Health Coach received return telephone call from  patient.  Hipaa compliance verified. Per patient her weight today is  237 pound. Patient has very little swelling. She is in the green zone. Her fasting blood sugar is 130. Per patient her highest was 170.  Patient stated she went and had her mammogram done. Per patient she talked with her physician about the urinary incontinence and the Dr told her some things she is trying first.  Patient stated she will go to the urologist. Per patient she is not having much  pain in her knee. Only when she stays on it and it starts swelling then she has a little pain. Per patient she missed her appointment with the ortho Dr. She is going to reschedule but she has to wait and see when  her daughter can take her. Patient stated she still has been eating a little cake and her family will not bring her any more watermelon because  of the urinary incontinence. Patient stated that she is eating other fruit. Patient has agreed to follow up outreach call.   Objective:   Encounter Medications:  Outpatient Encounter Prescriptions as of 01/04/2016  Medication Sig Note  . acetaminophen (TYLENOL) 325 MG tablet Take 325-650 mg by mouth every 6 (six) hours as needed for mild pain. Reported on 10/10/2015   . albuterol (PROVENTIL) (2.5 MG/3ML) 0.083% nebulizer solution Take 3 mLs (2.5 mg total) by nebulization every 6 (six) hours as needed for wheezing or shortness of breath.   [name] ambrisentan (LETAIRIS) 5 MG tablet Take 1 tablet (5 mg total) by mouth daily.   . Blood Glucose Monitoring Suppl (TRUE METRIX AIR GLUCOSE METER) w/Device KIT USE AS DIRECTED   . cyclobenzaprine (FLEXERIL) 5 MG tablet Take 1 tablet (5 mg total) by mouth 3 (three) times daily as needed for muscle spasms. 10/10/2015: Patients reports usually takes twice daily.    [name] EASY  TOUCH INSULIN SYRINGE 29G X 1/2" 1 ML MISC USE AS DIRECTED WITH  LEVEMIR  INSULIN   . gabapentin (NEURONTIN) 100 MG capsule Take 200 mg by mouth 3 (three) times daily. 07/31/2015: Wants to ask MD if she needs to stay on this   . glipiZIDE (GLUCOTROL) 5 MG tablet Take 0.5 tablets (2.5 mg total) by mouth daily before breakfast.   . glucose blood test strip Use as instructed to check blood glucose three times a day. e11.8   . insulin detemir (LEVEMIR) 100 UNIT/ML injection Inject 0.18 mLs (18 Units total) into the skin at bedtime. (Patient taking differently: Inject 20 Units into the skin at bedtime. ) 10/10/2015: Patient reports using 20 units daily now  . KLOR-CON M20 20 MEQ tablet TAKE ONE TABLET BY MOUTH TWICE DAILY   . Lancets MISC Use as directed to check blood sugar three times daily e11.8   . losartan (COZAAR) 50 MG tablet Take 1 tablet (50 mg total) by mouth daily. 07/31/2015: New medication started by Dr. Caryl Comes, 07/31/15  . meclizine (ANTIVERT) 25 MG tablet Take 25 mg by mouth daily as needed for dizziness. Reported on 10/10/2015   . Menthol, Topical Analgesic, (BIOFREEZE EX) Apply 1 application topically as needed (for pain).    . ondansetron (ZOFRAN) 8 MG tablet Take 1 tablet (8 mg total) by mouth every 8 (eight) hours as needed for nausea or vomiting. 07/31/2015: Only takes as needed  .  oxyCODONE-acetaminophen (PERCOCET/ROXICET) 5-325 MG tablet Take 1 tablet by mouth every 8 (eight) hours as needed for severe pain. 07/31/2015: Has been taking qd since hurt back a few days ago   . polyethylene glycol (MIRALAX / GLYCOLAX) packet Take 17 g by mouth daily as needed for moderate constipation. 05/31/2015: Needing it about every day  . promethazine (PHENERGAN) 25 MG tablet Take 25 mg by mouth every 4 (four) hours as needed for nausea or vomiting. Reported on 10/10/2015   . torsemide (DEMADEX) 20 MG tablet Take 1 tablet (20 mg total) by mouth 2 (two) times daily. 10/30/2015: Patient reported taking one tablet and  sometimes even skips a day  . verapamil (VERELAN PM) 240 MG 24 hr capsule TAKE ONE CAPSULE BY MOUTH ONCE DAILY   . naproxen (EC NAPROSYN) 500 MG EC tablet Take 500 mg by mouth 2 (two) times daily as needed (for pain). Reported on 01/04/2016 10/10/2015: Patient has not used this month   No facility-administered encounter medications on file as of 01/04/2016.    Functional Status:  In your present state of health, do you have any difficulty performing the following activities: 01/04/2016 12/05/2015  Hearing? - N  Vision? - N  Difficulty concentrating or making decisions? - N  Walking or climbing stairs? - Y  Dressing or bathing? - Y  Doing errands, shopping? - Y  Preparing Food and eating ? Y Y  Using the Toilet? Y Y  In the past six months, have you accidently leaked urine? Y Y  Do you have problems with loss of bowel control? N N  Managing your Medications? N N  Managing your Finances? N N  Housekeeping or managing your Housekeeping? [name]    Fall/Depression Screening: PHQ 2/9 Scores 01/04/2016 12/05/2015 11/21/2015 10/29/2015 09/28/2015 08/31/2015 08/31/2015  PHQ - 2 Score 0 0 0 0 0 0 0   THN CM Care Plan Problem One        Most Recent Value   Care Plan Problem One  Knowledge deficit in self management of congestive heart failure   Role Documenting the Problem One  [place] for Problem One  Active   THN Long Term Goal (31-90 days)  Patient will continue to not have any admissions for CHF within the next 90 days   THN Long Term Goal Start Date  01/04/16   Interventions for Problem One [place] reminded patient to keep appointments with primary care physician. RN Health Coach will monitor patient monthly telephonic. RN reminded patient the importance of taking medications as per physician ordered   [facility] CM Short Term Goal #1 (0-30 days)  Patient will weigh daily and record in log within the next 30 days   THN CM Short Term Goal #1 Start Date  01/04/16   [facility]  CM Short Term Goal #1 Met Date  01/04/16   Interventions for Short Term Goal #1  RN Health Coach discussed the importance of weight gain of 3 pounds or more pounds in a day or 5 pounds in one week. Patient will notify doctor. RN Health Coach discussed the importance of weighing the same time each day.   THN CM Short Term Goal #2 (0-30 days)  Patient will  report medication adherence within the next 30 days   THN CM Short Term Goal #2 Start Date  01/04/16   Interventions for Short Term Goal #2  Rn discussed with patient the importance of taking medication  as per Dr ordered. RN will follow up with patient of adherence of medication   THN CM Short Term Goal #3 (0-30 days)  Patient will verbalize eating heart health food and snacks within 30 days   THN CM Short Term Goal #3 Start Date  10/29/15   Interventions for Short Tern Goal #3  RN sent patient a list of healthy snacks.  RN sent patient eduational material on a heart healthy diet. RN will continue to follow up with patient       Assessment: Patient has not been sticking with her diet Patient will continue to  benefit from [place] telephonic outreach for education and support for diabetes self management.  Plan: RN discussed healthy food choices RN  Sent patient  Living better with heart failure booklet to encourage patient to take medications and eat low salt foods and get moving. RN will follow up with outreach call in August to discussion and teach back   [place] Management [phone]

## 2016-01-07 ENCOUNTER — Ambulatory Visit (INDEPENDENT_AMBULATORY_CARE_PROVIDER_SITE_OTHER): Payer: Commercial Managed Care - HMO | Admitting: Internal Medicine

## 2016-01-07 ENCOUNTER — Encounter: Payer: Self-pay | Admitting: Internal Medicine

## 2016-01-07 VITALS — BP 142/78 | HR 79 | Ht 61.0 in | Wt 239.2 lb

## 2016-01-07 DIAGNOSIS — E118 Type 2 diabetes mellitus with unspecified complications: Secondary | ICD-10-CM

## 2016-01-07 DIAGNOSIS — Z794 Long term (current) use of insulin: Secondary | ICD-10-CM

## 2016-01-07 DIAGNOSIS — I10 Essential (primary) hypertension: Secondary | ICD-10-CM

## 2016-01-07 DIAGNOSIS — J9611 Chronic respiratory failure with hypoxia: Secondary | ICD-10-CM

## 2016-01-07 MED ORDER — OXYCODONE-ACETAMINOPHEN 5-325 MG PO TABS: 1.0000 | ORAL_TABLET | Freq: Three times a day (TID) | ORAL | Status: DC | PRN

## 2016-01-07 NOTE — Assessment & Plan Note (Signed)
Symptoms relatively stable. Continue supplemental oxygen. Follow up with Pulmonary as scheduled.

## 2016-01-07 NOTE — Assessment & Plan Note (Signed)
BP Readings from Last 3 Encounters:  01/07/16 142/78  12/28/15 118/70  11/21/15 124/78   BP well controlled. Recent renal function stable. Continue current medications.

## 2016-01-07 NOTE — Progress Notes (Signed)
Subjective:    Patient ID: Debra Saunders, female    DOB: [birth year], 70 y.o.   MRN: [id number]  HPI  70YO female presents for follow up.  Feeling well generally. Chest pain has been well controlled. Has not recently used pain medication for chronic chest wall pain.  Frustrated by weight. Only eating about 2 times daily. BG dropped to low of 86 one time. Activity is limited because of chronic dyspnea.  DM - BG generally well controlled. Compliant with medication.  Lab Results  Component Value Date   HGBA1C 7.2* 11/21/2015    Wt Readings from Last 3 Encounters:  01/07/16 239 lb 3.2 oz (108.5 kg)  12/28/15 236 lb 12.8 oz (107.412 kg)  11/21/15 237 lb 6.4 oz (107.684 kg)   BP Readings from Last 3 Encounters:  01/07/16 142/78  12/28/15 118/70  11/21/15 124/78    Past Medical History  Diagnosis Date  . Thyroid disorder   . Diabetes mellitus ([place])     type II  . Thyroid nodule   . Sleep apnea   . Obesity   . Hypertension   . Hyperlipidemia   . Urinary incontinence   . CHF (congestive heart failure) ([name]) 2005    Dx at [facility]  . Chronic back pain   . COPD (chronic obstructive pulmonary disease) ([place])     2L [place]  . Sleep apnea   . Lipoma of back   . Arthritis   . Streptococcal bacteremia   . Acute renal failure ([place])   . Stroke ([place])   . Sepsis with metabolic encephalopathy ([place])   . Gastritis   . Symptomatic bradycardia   . Chronic respiratory failure (HCC)     a. on home O2   Family History  Problem Relation Age of Onset  . Heart disease Mother   . Hypertension Mother   . COPD Mother     was a smoker  . Thyroid disease Mother   . Hypertension Father   . Hypertension Sister   . Thyroid disease Sister   . Hypertension Sister   . Thyroid disease Sister   . Breast cancer Maternal Aunt    Past Surgical History  Procedure Laterality Date  . Cholecystectomy    . Vaginal hysterectomy    . Cardiac catheterization  2013    @ [place]: No obstructive CAD: Only  20% ostial left circumflex.  . Abdominal hysterectomy  1969  . Cyst removal trunk  2002    BACK  . Lipoma removal  2014    back  . Pacemaker insertion  2015    [place] dual chamber pacemaker implanted by Dr Caryl Comes for symptomatic bradycardia  . Insert / replace / remove pacemaker    . Permanent pacemaker insertion N/A 01/09/2014    Procedure: PERMANENT PACEMAKER INSERTION;  Surgeon: Deboraha Sprang, MD;  Location: [facility] CATH LAB;  Service: Cardiovascular;  Laterality: N/A;  . Trigger finger release     Social History   Social History  . Marital Status: Divorced    Spouse Name: N/A  . Number of Children: N/A  . Years of Education: N/A   Occupational History  . Retired- factory work     exposed to [place]  . Smoking status: Former Smoker -- 0.30 packs/day for 2 years    Types: Cigarettes    Quit date: 06/23/1990  . Smokeless tobacco: Never Used  . Alcohol Use: No  . Drug Use: No  .  Sexual Activity: Not Asked   Other Topics Concern  . None   Social History Narrative   Lives in [place] with daughter. Has 4 children. No pets.      Work - Restaurant manager, fast food, retired      Diet - regular    Review of Systems  Constitutional: Positive for fatigue. Negative for fever, chills, appetite change and unexpected weight change.  Eyes: Negative for visual disturbance.  Respiratory: Positive for shortness of breath (chronic, stable). Negative for cough, chest tightness and wheezing.   Cardiovascular: Positive for leg swelling (left leg lymphedema). Negative for chest pain and palpitations.  Gastrointestinal: Negative for abdominal pain, diarrhea and constipation.  Musculoskeletal: Negative for myalgias and arthralgias.  Skin: Negative for color change and rash.  Hematological: Negative for adenopathy. Does not bruise/bleed easily.  Psychiatric/Behavioral: Positive for sleep disturbance (some disrupted sleep pattern at times, has trouble falling  asleep and wakes late in day). Negative for dysphoric mood. The patient is not nervous/anxious.        Objective:    BP 142/78 mmHg  Pulse 79  Ht 5\' 1"  (1.549 m)  Wt 239 lb 3.2 oz (108.5 kg)  BMI 45.22 kg/m2  SpO2 94% 3L [name] Physical Exam  Constitutional: She is oriented to person, place, and time. She appears well-developed and well-nourished. No distress.  HENT:  Head: Normocephalic and atraumatic.  Right Ear: External ear normal.  Left Ear: External ear normal.  Nose: Nose normal.  Mouth/Throat: Oropharynx is clear and moist. No oropharyngeal exudate.  Eyes: Conjunctivae are normal. Pupils are equal, round, and reactive to light. Right eye exhibits no discharge. Left eye exhibits no discharge. No scleral icterus.  Neck: Normal range of motion. Neck supple. No tracheal deviation present. No thyromegaly present.  Cardiovascular: Normal rate, regular rhythm and intact distal pulses.  Exam reveals no gallop and no friction rub.   Murmur heard.  Systolic murmur is present  Pulmonary/Chest: Effort normal and breath sounds normal. No accessory muscle usage. No tachypnea. No respiratory distress. She has no decreased breath sounds. She has no wheezes. She has no rhonchi. She has no rales. She exhibits no tenderness.  Musculoskeletal: Normal range of motion. She exhibits no edema or tenderness.  Lymphadenopathy:    She has no cervical adenopathy.  Neurological: She is alert and oriented to person, place, and time. No cranial nerve deficit. She exhibits normal muscle tone. Coordination normal.  Skin: Skin is warm and dry. No rash noted. She is not diaphoretic. No erythema. No pallor.  Psychiatric: She has a normal mood and affect. Her behavior is normal. Judgment and thought content normal.          Assessment & Plan:   Problem List Items Addressed This Visit      High   Chronic respiratory failure with hypoxia (HCC) - Primary (Chronic)    Symptoms relatively stable. Continue  supplemental oxygen. Follow up with Pulmonary as scheduled.      Diabetes mellitus type 2, controlled ([place]) (Chronic)    BG well controlled by report. Plan to repeat A1c in early September. Continue current medication including Levemir and Glipizide.      Hypertension (Chronic)    BP Readings from Last 3 Encounters:  01/07/16 142/78  12/28/15 118/70  11/21/15 124/78   BP well controlled. Recent renal function stable. Continue current medications.          Return in about 2 months (around 03/09/2016) for New Patient.  Ronette Deter, MD Internal Medicine  [facility]

## 2016-01-07 NOTE — Assessment & Plan Note (Signed)
BG well controlled by report. Plan to repeat A1c in early September. Continue current medication including Levemir and Glipizide.

## 2016-01-07 NOTE — Progress Notes (Signed)
Pre visit review using our clinic review tool, if applicable. No additional management support is needed unless otherwise documented below in the visit note. 

## 2016-01-07 NOTE — Patient Instructions (Addendum)
Labs in early September.  Follow up in September with Dr. [name].

## 2016-01-14 ENCOUNTER — Ambulatory Visit: Payer: Self-pay | Admitting: *Deleted

## 2016-01-14 DIAGNOSIS — I509 Heart failure, unspecified: Secondary | ICD-10-CM | POA: Diagnosis not present

## 2016-01-22 NOTE — Progress Notes (Signed)
* [facility]    Assessment and Plan:  Pulmonary hypertension --Currently on letairis. --2004 [facility]: right heart catheterization from 10/25/02 revealed a PA pressure of 62/25 (mean 36) and a mean PA pressure decreased remarkably to 20 during this catheterization. A PCWP was normal at 6 and a cardiac index of 2.42 L/m/m2 with an initial increase in vasodilator therapy to 2.71 L/m/m2 -The cause of this is multifactorial due to CHF, thromboembolic disease, primary pulmonary disease.    Pulmonary embolus -Status post IVC filter. 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 Will not start anticoagulation at this time , she has a history of bleeding with  aspirin.  OSA (obstructive sleep apnea) --04/2012 CPAP titration study 13cm of H20 ideal -- continue current CPAP settings.  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 --Oxygen 3 L continuously  Restrictive Lung disease.  --Suspected due to sarcoidosis , as well as obesity..    Chronic diastolic heart failure -Follows with Dr. Fletcher Anon.  This is likely contributing to her chronic dyspnea.   social.  - discussed end-of-life issues, including CODE STATUS today In the presence of her daughter.She maintains that she would like to be kept alive as long as possible , though she did not think that she will want to be maintained on machines for prolonged periods of time. I did discuss with her today that should she end up on the ventilator , it may be difficult to wean her off as she has very advanced cardiopulmonary disease. Her functional status has declined significantly over the last 3 years , and I informed her that I expect her status to continue to decline a similar way over the next few months and years. I explained that her conditions are irreversible and  her medical therapies are aimed at trying to slow  the progress of her disease. 30 min spent in advance care planning discussions.    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  She was noted to have a brain bleed with aspirin in the past, she can no longer be on blood thinners.   since her last visit, it was noted that her Milda Smart was no longer covered, therefore she was being transitioned to O[employer]it.  However she was informed  that [facility] Rossetti will now cover Letairis. She has been tried on inhalers in the past which have not really helped. She was on xopenex nebuluizers in the past which helped a bit. She felt it was too strong, then tried albuterol and seemed to work better she uses this a few times per week and this seems to help.   Her breathing has been ok, she feels it it stable since her last visit. Has been using cpap nightly. She uses 3L continuous at home, and portable 3L pulse  Review of download data today shows 100% compliance, the patient is currently on AutoSet, 5-20. Residual AHI is 1.8, 95th percentile pressure is 11.6. Maximum pressure was 13.5.   Review of old testing:   PFT 07/13/2015 : FVC was 1.2 L 60% of predicted, FEV1 was 0.98 L, 64% of predicted, FEV to FVC was 82%. There was no significant improvement with bronchodilator. Residual volume was 44% of predicted, TLC was 49% of  predicted. Diffusion capacity was 39% of predicted. Overall this test is consistent with restrictive lung disease. 2004 [facility]: right heart catheterization from 10/25/02 revealed a PA pressure of 62/25 (mean 36) and a mean PA pressure decreased remarkably to 20 during this catheterization. A PCWP was normal at 6 and a cardiac index of 2.42 L/m/m2 with an initial increase in vasodilator therapy to 2.71 L/m/m2 2004 6MW 313 meters (1,027 feet) On oxygen in  2004 04/22/2012 6 minute walk>> total distance walked 950 feet, heart rate ranged between 56 and 88, O2 saturation ranged between 91 and 98% on 2 L nasal cannula 11-26 Spirometry >> no obstruction, FEV1 1.42 L (70%) 11-26 6MW >> 795 Ft, O2 saturation 90% lowest on 2L 07/2012 6MW >> 1115 feet, O2 saturation 97% 2LNC 03/2014 CT chest > Upper lobe and perihilar predominant ground glass and reticular coarsening and traction bronchiectasis and architectural distortion suggesting postinflammatory fibrosis. Sarcoid considered considering hilar lymph nodes, enlarged pulmonary arteries indicative of pulmonary arterial hypertension  Medication:   Reviewed.     Allergies:  Penicillins; Aspirin; and Other  Review of Systems: Gen:  Denies  fever, sweats, chills HEENT: Denies blurred vision, double vision,. Cvc:  No dizziness, chest pain or heaviness Resp:   Denies cough or sputum porduction. Gi: Denies swallowing difficulty. Gu:  Denies bladder incontinence, burning urine Ext:   No Joint pain, stiffness or swelling Skin: No skin rash, easy bruising or bleeding or hives Endoc:  No polyuria, polydipsia ,  Psych: No depression, insomnia or hallucinations  Other:  All other systems negative  Physical Examination:   VS: BP (!) 152/90 (BP Location: Left Arm, Cuff Size: Normal)   Pulse 72   Ht 5' (1.524 m)   Wt 238 lb (108 kg)   SpO2 94%   BMI 46.48 kg/m   General Appearance: No distress  Neuro:without focal findings,  speech normal,  HEENT: PERRLA, EOM intact. Pulmonary: normal breath sounds, No wheezing, CardiovascularNormal S1,S2.  No m/r/g.   Abdomen: Benign, Soft, non-tender. Renal:  No costovertebral tenderness  GU:  No performed at this time. Endoc: No evident thyromegaly, no signs of acromegaly or Cushing features Skin:   warm, no rashes, no ecchymosis  Extremities: normal, no cyanosis, clubbing, Chronically swollen LLE extrem with 3+ edema.  capillary refill. Other findings:      Thank  you for allowing [facility] to assist in the care of your patient. Our recommendations are noted above.  Please contact us if we can be of further service.   Marda Stalker, MD.  [facility]   Patricia Pesa, M.D.  Vilinda Boehringer, M.D.  Cheral Marker, M.D

## 2016-01-23 ENCOUNTER — Encounter: Payer: Self-pay | Admitting: Internal Medicine

## 2016-01-24 ENCOUNTER — Ambulatory Visit (INDEPENDENT_AMBULATORY_CARE_PROVIDER_SITE_OTHER): Payer: Commercial Managed Care - HMO | Admitting: Internal Medicine

## 2016-01-24 ENCOUNTER — Encounter: Payer: Self-pay | Admitting: Internal Medicine

## 2016-01-24 VITALS — BP 152/90 | HR 72 | Ht 60.0 in | Wt 238.0 lb

## 2016-01-24 DIAGNOSIS — J9611 Chronic respiratory failure with hypoxia: Secondary | ICD-10-CM

## 2016-01-24 NOTE — Patient Instructions (Signed)
Continue oxygen, nebulizer treatment.

## 2016-01-26 DIAGNOSIS — J9611 Chronic respiratory failure with hypoxia: Secondary | ICD-10-CM | POA: Diagnosis not present

## 2016-01-28 ENCOUNTER — Other Ambulatory Visit: Payer: Self-pay | Admitting: *Deleted

## 2016-01-28 MED ORDER — AMBRISENTAN 5 MG PO TABS: 5.0000 mg | ORAL_TABLET | Freq: Every day | ORAL | 5 refills | Status: DC

## 2016-01-29 ENCOUNTER — Ambulatory Visit (INDEPENDENT_AMBULATORY_CARE_PROVIDER_SITE_OTHER): Payer: Commercial Managed Care - HMO | Admitting: *Deleted

## 2016-01-29 DIAGNOSIS — I495 Sick sinus syndrome: Secondary | ICD-10-CM | POA: Diagnosis not present

## 2016-01-29 NOTE — Progress Notes (Signed)
Remote pacemaker transmission.   

## 2016-01-30 ENCOUNTER — Encounter: Payer: Self-pay | Admitting: Cardiology

## 2016-01-31 ENCOUNTER — Encounter: Payer: Self-pay | Admitting: *Deleted

## 2016-01-31 ENCOUNTER — Other Ambulatory Visit: Payer: Self-pay | Admitting: *Deleted

## 2016-01-31 NOTE — Patient Outreach (Signed)
[facility]) Care Management  01/31/2016   Debra Saunders 06-Oct-1945 [id number]  Subjective: RN Health Coach telephone call to patient.  Hipaa compliance verified. Per patient she is taking her diuretic as per ordered. Patient weight today is 237.4 pounds. Patient fasting blood sugar is 125. Per patient she is using her CPAP as ordered. Patient stated that she has a timer that goes off every 30 minutes to remind her to go to the bathroom. Patient states she wakes up in the morning soaking wet from urinary incontinence . Patient stated that she uses 18 incontinent briefs a day. RN discussed with patient about following through on seeing a urologist. Patient states she has not been able to exercise. RN discussed chair exercises for patient to try. Per patient she is having a hard time eating additional snacks. Patient family has prepared mini snacks to help encourage patient to eat. Patient has agreed to follow up outreach calls.   Objective:   Current Medications:  Current Outpatient Prescriptions  Medication Sig Dispense Refill  . acetaminophen (TYLENOL) 325 MG tablet Take 325-650 mg by mouth every 6 (six) hours as needed for mild pain. Reported on 10/10/2015    . albuterol (PROVENTIL) (2.5 MG/3ML) 0.083% nebulizer solution Take 3 mLs (2.5 mg total) by nebulization every 6 (six) hours as needed for wheezing or shortness of breath. 75 mL 12  . ambrisentan (LETAIRIS) 5 MG tablet Take 1 tablet (5 mg total) by mouth daily. 30 tablet 5  . Blood Glucose Monitoring Suppl (TRUE METRIX AIR GLUCOSE METER) w/Device KIT USE AS DIRECTED 1 kit 0  . cyclobenzaprine (FLEXERIL) 5 MG tablet Take 1 tablet (5 mg total) by mouth 3 (three) times daily as needed for muscle spasms. 90 tablet 2  . EASY TOUCH INSULIN SYRINGE 29G X 1/2" 1 ML MISC USE AS DIRECTED WITH  LEVEMIR  INSULIN 100 each 0  . gabapentin (NEURONTIN) 100 MG capsule Take 200 mg by mouth 3 (three) times daily.    [name] glipiZIDE  (GLUCOTROL) 5 MG tablet Take 0.5 tablets (2.5 mg total) by mouth daily before breakfast. 60 tablet 3  . glucose blood test strip Use as instructed to check blood glucose three times a day. e11.8 100 each 12  . insulin detemir (LEVEMIR) 100 UNIT/ML injection Inject 0.18 mLs (18 Units total) into the skin at bedtime. (Patient taking differently: Inject 20 Units into the skin at bedtime. ) 10 mL 5  . KLOR-CON M20 20 MEQ tablet TAKE ONE TABLET BY MOUTH TWICE DAILY 60 tablet 0  . Lancets MISC Use as directed to check blood sugar three times daily e11.8 100 each 11  . losartan (COZAAR) 50 MG tablet Take 1 tablet (50 mg total) by mouth daily. 30 tablet 11  . meclizine (ANTIVERT) 25 MG tablet Take 25 mg by mouth daily as needed for dizziness. Reported on 10/10/2015    . Menthol, Topical Analgesic, (BIOFREEZE EX) Apply 1 application topically as needed (for pain).     . ondansetron (ZOFRAN) 8 MG tablet Take 1 tablet (8 mg total) by mouth every 8 (eight) hours as needed for nausea or vomiting. 30 tablet 2  . oxyCODONE-acetaminophen (PERCOCET/ROXICET) 5-325 MG tablet Take 1 tablet by mouth every 8 (eight) hours as needed for severe pain. 60 tablet 0  . polyethylene glycol (MIRALAX / GLYCOLAX) packet Take 17 g by mouth daily as needed for moderate constipation.    . promethazine (PHENERGAN) 25 MG tablet Take 25 mg by mouth every  4 (four) hours as needed for nausea or vomiting. Reported on 10/10/2015    . torsemide (DEMADEX) 20 MG tablet Take 1 tablet (20 mg total) by mouth 2 (two) times daily. 60 tablet 6  . verapamil (VERELAN PM) 240 MG 24 hr capsule TAKE ONE CAPSULE BY MOUTH ONCE DAILY 90 capsule 0  . naproxen (EC NAPROSYN) 500 MG EC tablet Take 500 mg by mouth 2 (two) times daily as needed (for pain). Reported on 01/04/2016     No current facility-administered medications for this visit.     Functional Status:  In your present state of health, do you have any difficulty performing the following activities:  01/31/2016 01/04/2016  Hearing? N -  Vision? N -  Difficulty concentrating or making decisions? N -  Walking or climbing stairs? Y -  Dressing or bathing? Y -  Doing errands, shopping? Y -  Conservation officer, nature and eating ? Y Y  Using the Toilet? Y Y  In the past six months, have you accidently leaked urine? Y Y  Do you have problems with loss of bowel control? N N  Managing your Medications? N N  Managing your Finances? N N  Housekeeping or managing your Housekeeping? [name]  Some recent data might be hidden    Fall/Depression Screening: PHQ 2/9 Scores 01/31/2016 01/07/2016 01/04/2016 12/05/2015 11/21/2015 10/29/2015 09/28/2015  PHQ - 2 Score 0 0 0 0 0 0 0   THN CM Care Plan Problem One   Flowsheet Row Most Recent Value  Care Plan Problem One  Knowledge deficit in self management of congestive heart failure  Role Documenting the Problem One  [place] for Problem One  Active  THN Long Term Goal (31-90 days)  Patient will continue to not have any admissions for CHF within the next 90 days  THN Long Term Goal Start Date  01/31/16  Interventions for Problem One [place] reminded patient to keep appointments with primary care physician. RN Health Coach will monitor patient monthly telephonic. RN reminded patient the importance of taking medications as per physician ordered  [facility] CM Short Term Goal #2 (0-30 days)  Patient will  report medication adherence within the next 30 days  THN CM Short Term Goal #2 Start Date  01/31/16  [facility] CM Short Term Goal #2 Met Date  01/31/16  Interventions for Short Term Goal #2  Rn discussed with patient the importance of taking medication as per Dr ordered. RN will follow up with patient of adherence of medication  THN CM Short Term Goal #3 (0-30 days)  Patient will verbalize eating heart health food and snacks within 30 days  THN CM Short Term Goal #3 Start Date  01/31/16  Interventions for Short Tern Goal #3  RN sent patient a list of  healthy snacks.  RN sent patient eduational material on a heart healthy diet. RN will continue to follow up with patient   THN CM Short Term Goal #4 (0-30 days)  Patient will report trying to get into a routine exercise within the next 30 days  THN CM Short Term Goal #4 Start Date  01/31/16  Interventions for Short Term Goal #4  RN dfiscussed with patient the importance of exercising since she is wanting to loose weight.RN sent patient a list of chair exercises. RN will follow up with patient  THN CM Short Term Goal #5 (0-30 days)  Patient will verbalize making an appointment with a urologist  within the next 30 days  THN CM Short Term Goal #5 Start Date  01/31/16  Interventions for Short Term Goal #5  RN discussed with patient the potential of skin breakdown since patient is a diabetic and wakes up wet each night. and using 18 incontinent briefs a day.. RN will follow up with patient      Assessment:  Patient has urinary incontinence Patient does not have a routine exercise program Patient will continue to  benefit from [place] telephonic outreach for education and support for Congestive Heart Failure self management.   Plan:  RN send patient educational material on chair exercises Patient will contact PCP and get referral for a urologist RN will send patient information on incontinence care RN will follow up with the month of September  Johny Shock BSN RN [facility] [phone]

## 2016-02-04 ENCOUNTER — Other Ambulatory Visit: Payer: Self-pay

## 2016-02-04 MED ORDER — POTASSIUM CHLORIDE CRYS ER 20 MEQ PO TBCR: 20.0000 meq | EXTENDED_RELEASE_TABLET | Freq: Two times a day (BID) | ORAL | 0 refills | Status: DC

## 2016-02-06 LAB — CUP PACEART REMOTE DEVICE CHECK
Battery Remaining Longevity: 150 mo
Battery Remaining Percentage: 100 %
Brady Statistic RA Percent Paced: 27 %
Brady Statistic RV Percent Paced: 0 %
Date Time Interrogation Session: 20170808074500
Implantable Lead Implant Date: 20150720
Implantable Lead Implant Date: 20150720
Implantable Lead Location: 753859
Implantable Lead Location: 753860
Implantable Lead Model: 5076
Implantable Lead Model: 5076
Lead Channel Impedance Value: 522 Ohm
Lead Channel Impedance Value: 604 Ohm
Lead Channel Setting Pacing Amplitude: 2 V
Lead Channel Setting Pacing Amplitude: 2.4 V
Lead Channel Setting Pacing Pulse Width: 0.4 ms
Lead Channel Setting Sensing Sensitivity: 2.5 mV
Pulse Gen Serial Number: 390330

## 2016-02-14 DIAGNOSIS — I509 Heart failure, unspecified: Secondary | ICD-10-CM | POA: Diagnosis not present

## 2016-02-26 ENCOUNTER — Encounter: Payer: Self-pay | Admitting: Internal Medicine

## 2016-02-26 DIAGNOSIS — J9611 Chronic respiratory failure with hypoxia: Secondary | ICD-10-CM | POA: Diagnosis not present

## 2016-03-03 ENCOUNTER — Telehealth: Payer: Self-pay | Admitting: Internal Medicine

## 2016-03-03 ENCOUNTER — Encounter: Payer: Self-pay | Admitting: Internal Medicine

## 2016-03-03 ENCOUNTER — Telehealth: Payer: Self-pay | Admitting: *Deleted

## 2016-03-03 ENCOUNTER — Ambulatory Visit (INDEPENDENT_AMBULATORY_CARE_PROVIDER_SITE_OTHER): Payer: Commercial Managed Care - HMO | Admitting: Internal Medicine

## 2016-03-03 DIAGNOSIS — N390 Urinary tract infection, site not specified: Secondary | ICD-10-CM | POA: Insufficient documentation

## 2016-03-03 DIAGNOSIS — I82502 Chronic embolism and thrombosis of unspecified deep veins of left lower extremity: Secondary | ICD-10-CM

## 2016-03-03 DIAGNOSIS — I82509 Chronic embolism and thrombosis of unspecified deep veins of unspecified lower extremity: Secondary | ICD-10-CM | POA: Insufficient documentation

## 2016-03-03 LAB — POC URINALSYSI DIPSTICK (AUTOMATED)
Blood, UA: NEGATIVE
Ketones, UA: NEGATIVE
Nitrite, UA: NEGATIVE
Spec Grav, UA: 1.025
Urobilinogen, UA: 1
pH, UA: 6

## 2016-03-03 MED ORDER — LEVOFLOXACIN 250 MG PO TABS: 250.0000 mg | ORAL_TABLET | Freq: Every day | ORAL | 0 refills | Status: DC

## 2016-03-03 NOTE — Telephone Encounter (Signed)
Spoke with daughter and she states pt is not feeling well and that they will call back to schedule SMW and f/u appt with DR.

## 2016-03-03 NOTE — Progress Notes (Signed)
Subjective:    Patient ID: Debra Saunders, female    DOB: [birth year], 70 y.o.   MRN: [id number]  HPI Here with daughter due to leg swelling and pain Chronic swelling but noted redness, warmth and worsened swelling Has had "flu like" symptoms  Chronic DVT and swelling goes back about 12 years Was given blood thinners---didn't dissolve the clot but then had hemorrhagic stroke in 2014 Now has IVC filter  Teeth chattering shakes last night Chilled but felt hot  Gave 2 tylenol last night Tried cold compresses also  Current Outpatient Prescriptions on File Prior to Visit  Medication Sig Dispense Refill  . acetaminophen (TYLENOL) 325 MG tablet Take 325-650 mg by mouth every 6 (six) hours as needed for mild pain. Reported on 10/10/2015    . albuterol (PROVENTIL) (2.5 MG/3ML) 0.083% nebulizer solution Take 3 mLs (2.5 mg total) by nebulization every 6 (six) hours as needed for wheezing or shortness of breath. 75 mL 12  . Blood Glucose Monitoring Suppl (TRUE METRIX AIR GLUCOSE METER) w/Device KIT USE AS DIRECTED 1 kit 0  . cyclobenzaprine (FLEXERIL) 5 MG tablet Take 1 tablet (5 mg total) by mouth 3 (three) times daily as needed for muscle spasms. 90 tablet 2  . EASY TOUCH INSULIN SYRINGE 29G X 1/2" 1 ML MISC USE AS DIRECTED WITH  LEVEMIR  INSULIN 100 each 0  . gabapentin (NEURONTIN) 100 MG capsule Take 200 mg by mouth 3 (three) times daily.    [name] glipiZIDE (GLUCOTROL) 5 MG tablet Take 0.5 tablets (2.5 mg total) by mouth daily before breakfast. 60 tablet 3  . glucose blood test strip Use as instructed to check blood glucose three times a day. e11.8 100 each 12  . insulin detemir (LEVEMIR) 100 UNIT/ML injection Inject 0.18 mLs (18 Units total) into the skin at bedtime. (Patient taking differently: Inject 20 Units into the skin at bedtime. ) 10 mL 5  . Lancets MISC Use as directed to check blood sugar three times daily e11.8 100 each 11  . losartan (COZAAR) 50 MG tablet Take 1 tablet (50 mg total)  by mouth daily. 30 tablet 11  . meclizine (ANTIVERT) 25 MG tablet Take 25 mg by mouth daily as needed for dizziness. Reported on 10/10/2015    . Menthol, Topical Analgesic, (BIOFREEZE EX) Apply 1 application topically as needed (for pain).     . naproxen (EC NAPROSYN) 500 MG EC tablet Take 500 mg by mouth 2 (two) times daily as needed (for pain). Reported on 01/04/2016    . ondansetron (ZOFRAN) 8 MG tablet Take 1 tablet (8 mg total) by mouth every 8 (eight) hours as needed for nausea or vomiting. 30 tablet 2  . oxyCODONE-acetaminophen (PERCOCET/ROXICET) 5-325 MG tablet Take 1 tablet by mouth every 8 (eight) hours as needed for severe pain. 60 tablet 0  . polyethylene glycol (MIRALAX / GLYCOLAX) packet Take 17 g by mouth daily as needed for moderate constipation.    . potassium chloride SA (KLOR-CON M20) 20 MEQ tablet Take 1 tablet (20 mEq total) by mouth 2 (two) times daily. 60 tablet 0  . promethazine (PHENERGAN) 25 MG tablet Take 25 mg by mouth every 4 (four) hours as needed for nausea or vomiting. Reported on 10/10/2015    . torsemide (DEMADEX) 20 MG tablet Take 1 tablet (20 mg total) by mouth 2 (two) times daily. 60 tablet 6  . verapamil (VERELAN PM) 240 MG 24 hr capsule TAKE ONE CAPSULE BY MOUTH ONCE DAILY 90  capsule 0   No current facility-administered medications on file prior to visit.     Allergies  Allergen Reactions  . Penicillins Hives, Shortness Of Breath, Swelling and Other (See Comments)    Has patient had a PCN reaction causing immediate rash, facial/tongue/throat swelling, SOB or lightheadedness with hypotension: Yes Has patient had a PCN reaction causing severe rash involving mucus membranes or skin necrosis: No Has patient had a PCN reaction that required hospitalization No Has patient had a PCN reaction occurring within the last 10 years: No If all of the above answers are "NO", then may proceed with Cephalosporin use.  . Aspirin Hives and Other (See Comments)    Pt states  that she is unable to take because she had bleeding in her brain.    . Other Other (See Comments)    Pt states that she is unable to take blood thinners because she had bleeding in her brain.     Past Medical History:  Diagnosis Date  . Acute renal failure ([place])   . Arthritis   . CHF (congestive heart failure) ([place]) 2005   Dx at [facility]  . Chronic back pain   . Chronic respiratory failure (HCC)    a. on home O2  . COPD (chronic obstructive pulmonary disease) ([place])    2L [place]  . Diabetes mellitus ([place])    type II  . Gastritis   . Hyperlipidemia   . Hypertension   . Lipoma of back   . Obesity   . Sepsis with metabolic encephalopathy ([place])   . Sleep apnea   . Sleep apnea   . Streptococcal bacteremia   . Stroke ([place])   . Symptomatic bradycardia   . Thyroid disorder   . Thyroid nodule   . Urinary incontinence     Past Surgical History:  Procedure Laterality Date  . ABDOMINAL HYSTERECTOMY  1969  . CARDIAC CATHETERIZATION  2013   @ [facility]: No obstructive CAD: Only 20% ostial left circumflex.  . CHOLECYSTECTOMY    . CYST REMOVAL TRUNK  2002   BACK  . INSERT / REPLACE / REMOVE PACEMAKER    . lipoma removal  2014   back  . PACEMAKER INSERTION  [address] Scientific dual chamber pacemaker implanted by Dr Caryl Comes for symptomatic bradycardia  . PERMANENT PACEMAKER INSERTION N/A 01/09/2014   Procedure: PERMANENT PACEMAKER INSERTION;  Surgeon: Deboraha Sprang, MD;  Location: [facility] CATH LAB;  Service: Cardiovascular;  Laterality: N/A;  . TRIGGER FINGER RELEASE    . VAGINAL HYSTERECTOMY      Family History  Problem Relation Age of Onset  . Heart disease Mother   . Hypertension Mother   . COPD Mother     was a smoker  . Thyroid disease Mother   . Hypertension Father   . Hypertension Sister   . Thyroid disease Sister   . Hypertension Sister   . Thyroid disease Sister   . Breast cancer Maternal Aunt     Social History   Social History  . Marital status: Divorced    Spouse  name: N/A  . Number of children: N/A  . Years of education: N/A   Occupational History  . Retired- factory work     exposed to [place]  . Smoking status: Former Smoker    Packs/day: 0.30    Years: 2.00    Types: Cigarettes    Quit date: 06/23/1990  . Smokeless tobacco: Never Used  .  Alcohol use No  . Drug use: No  . Sexual activity: Not on file   Other Topics Concern  . Not on file   Social History Narrative   Lives in [place] with daughter. Has 4 children. No pets.      Work - Restaurant manager, fast food, retired      Diet - regular   Review of Systems No chest pain No change in breathing-- no change in chronic DOE No cold symptoms Wondered about a UTI---has frequency, some dysuria for 3 days No other skin problems Some nausea with start of the urinary symptoms Appetite is off    Objective:   Physical Exam  Constitutional: She appears well-developed. No distress.  Neck: Normal range of motion. Neck supple. No thyromegaly present.  Cardiovascular: Normal rate, regular rhythm and normal heart sounds.  Exam reveals no gallop.   No murmur heard. Pulmonary/Chest: Effort normal and breath sounds normal. No respiratory distress. She has no wheezes. She has no rales.  Abdominal: Soft. There is no tenderness.  Musculoskeletal:  2-3+ tense edema in left calf to knee and just beyond Slight redness just above knee No clear tenderness in calf  Lymphadenopathy:    She has no cervical adenopathy.  Skin:  No other skin lesions          Assessment & Plan:

## 2016-03-03 NOTE — Assessment & Plan Note (Signed)
Has leg findings but first started with dysuria 3 days ago Shaking chills last night consistent with systemic disease Concern for skin also Will treat with levaquin due to covering skin and UTI

## 2016-03-03 NOTE — Telephone Encounter (Signed)
She should keep her follow-up with me. If she does not improve with the treatment from Dr Silvio Pate she should be reevaluated sooner.

## 2016-03-03 NOTE — Telephone Encounter (Signed)
PLEASE NOTE: All timestamps contained within this report are represented as [place] Standard Time. CONFIDENTIALTY NOTICE: This fax transmission is intended only for the addressee. It contains information that is legally privileged, confidential or otherwise protected from use or disclosure. If you are not the intended recipient, you are strictly prohibited from reviewing, disclosing, copying using or disseminating any of this information or taking any action in reliance on or regarding this information. If you have received this fax in error, please notify us immediately by telephone so that we can arrange for its return to [place]. Phone: [phone], Toll-Free: [phone], Fax: [phone] Page: 1 of 1 Call Id: [id number] [place] Patient Name: Debra Saunders DOB: 1945/12/19 Initial Comment one of mothers leg is inflamed and red, looks like there is a rash on it. on/off chills, weak and body aches Nurse Assessment Nurse: Dimas Chyle, RN, Dellis Filbert Date/Time Eilene Ghazi Time): 03/03/2016 12:12:26 PM Confirm and document reason for call. If symptomatic, describe symptoms. You must click the next button to save text entered. ---One of her mother's leg is inflamed and red, looks like there is a rash on it. on/off chills, weak and body aches. Was running a fever yesterday but not now. Has the patient traveled out of the country within the last 30 days? ---No Does the patient have any new or worsening symptoms? ---Yes Will a triage be completed? ---Yes Related visit to physician within the last 2 weeks? ---No Does the PT have any chronic conditions? (i.e. diabetes, asthma, etc.) ---Yes List chronic conditions. ---HTN, diabetes type 2, COPD, sleep apnea Is this a behavioral health or substance abuse call? ---No Guidelines Guideline Title Affirmed Question Affirmed Notes Leg Pain History of prior "blood  clot" in leg or lungs (i.e., deep vein thrombosis, pulmonary embolism) Final Disposition User See Physician within 4 Hours (or PCP triage) Dimas Chyle, RN, Dellis Filbert Referrals REFERRED TO PCP OFFICE Disagree/Comply: Leta Baptist

## 2016-03-03 NOTE — Telephone Encounter (Signed)
Pt seen today by Dr. Silvio Pate. She establishes with Dr. [name] on 03/10/16.

## 2016-03-03 NOTE — Telephone Encounter (Signed)
-----   Message from Laverle Hobby, MD sent at 02/29/2016  6:19 PM EDT ----- Regarding: medication replacement.  I am not sure if we need to add a new medication, given her complex history and would like to see if she is noticing much of a difference off the medication.  If she can walk, I would like to do a 49m walk, if not can forego this.   Either way, needs follow up with me at next available and discuss if new medications need to be added.

## 2016-03-03 NOTE — Telephone Encounter (Signed)
Please let me know how you would like to handle this one.

## 2016-03-03 NOTE — Assessment & Plan Note (Signed)
Clear worsening of edema suggests the thrombus is more occlusive No Rx for this anyway other than heat and elevation--discussed this In case there could be element of cellulitis (though not clear cut), using levaquin for the urine

## 2016-03-03 NOTE — Progress Notes (Signed)
Pre visit review using our clinic review tool, if applicable. No additional management support is needed unless otherwise documented below in the visit note. 

## 2016-03-03 NOTE — Addendum Note (Signed)
Addended by: Pilar Grammes on: 03/03/2016 05:03 PM   Modules accepted: Orders

## 2016-03-05 ENCOUNTER — Ambulatory Visit: Payer: Self-pay | Admitting: *Deleted

## 2016-03-05 LAB — URINE CULTURE

## 2016-03-05 NOTE — Telephone Encounter (Signed)
Her pulmonary hypertension on her test was mild, and may not be the major cause of her pulmonary hypertension. Therefore I would like to see if she notices a difference off of the letairis. I would like to do a 81mw if she is capable of walking, otherwise we will see if her symptoms are any different off the letairis. I would like to see her 1-2 weeks after stopping letairis.

## 2016-03-09 ENCOUNTER — Other Ambulatory Visit: Payer: Self-pay | Admitting: Family Medicine

## 2016-03-10 ENCOUNTER — Ambulatory Visit (INDEPENDENT_AMBULATORY_CARE_PROVIDER_SITE_OTHER): Payer: Commercial Managed Care - HMO | Admitting: Family Medicine

## 2016-03-10 ENCOUNTER — Encounter: Payer: Self-pay | Admitting: Family Medicine

## 2016-03-10 ENCOUNTER — Ambulatory Visit: Payer: Commercial Managed Care - HMO | Admitting: *Deleted

## 2016-03-10 VITALS — BP 130/62 | HR 69 | Temp 98.3°F | Wt 233.0 lb

## 2016-03-10 DIAGNOSIS — R079 Chest pain, unspecified: Secondary | ICD-10-CM | POA: Insufficient documentation

## 2016-03-10 DIAGNOSIS — N3941 Urge incontinence: Secondary | ICD-10-CM | POA: Diagnosis not present

## 2016-03-10 DIAGNOSIS — R8290 Unspecified abnormal findings in urine: Secondary | ICD-10-CM

## 2016-03-10 DIAGNOSIS — R3915 Urgency of urination: Secondary | ICD-10-CM | POA: Diagnosis not present

## 2016-03-10 DIAGNOSIS — N390 Urinary tract infection, site not specified: Secondary | ICD-10-CM

## 2016-03-10 DIAGNOSIS — I82502 Chronic embolism and thrombosis of unspecified deep veins of left lower extremity: Secondary | ICD-10-CM

## 2016-03-10 DIAGNOSIS — R6883 Chills (without fever): Secondary | ICD-10-CM | POA: Diagnosis not present

## 2016-03-10 LAB — COMPREHENSIVE METABOLIC PANEL
ALT: 38 U/L — ABNORMAL HIGH (ref 0–35)
AST: 45 U/L — ABNORMAL HIGH (ref 0–37)
Albumin: 3.6 g/dL (ref 3.5–5.2)
Alkaline Phosphatase: 120 U/L — ABNORMAL HIGH (ref 39–117)
BUN: 16 mg/dL (ref 6–23)
CO2: 30 mEq/L (ref 19–32)
Calcium: 9.1 mg/dL (ref 8.4–10.5)
Chloride: 98 mEq/L (ref 96–112)
Creatinine, Ser: 0.91 mg/dL (ref 0.40–1.20)
GFR: 78 mL/min (ref >60.00)
Glucose, Bld: 249 mg/dL — ABNORMAL HIGH (ref 70–99)
Potassium: 3.7 mEq/L (ref 3.5–5.1)
Sodium: 136 mEq/L (ref 135–145)
Total Bilirubin: 0.5 mg/dL (ref 0.2–1.2)
Total Protein: 8 g/dL (ref 6.0–8.3)

## 2016-03-10 LAB — POCT URINALYSIS DIPSTICK
Blood, UA: NEGATIVE
Glucose, UA: 100
Ketones, UA: 15
Leukocytes, UA: NEGATIVE
Nitrite, UA: NEGATIVE
Protein, UA: 100
Spec Grav, UA: >=1.03
Urobilinogen, UA: 1
pH, UA: 5.5

## 2016-03-10 LAB — CBC
HCT: 38.1 % (ref 36.0–46.0)
Hemoglobin: 12.2 g/dL (ref 12.0–15.0)
MCHC: 32 g/dL (ref 30.0–36.0)
MCV: 82.8 fl (ref 78.0–100.0)
Platelets: 285 10*3/uL (ref 150.0–400.0)
RBC: 4.6 Mil/uL (ref 3.87–5.11)
RDW: 13.9 % (ref 11.5–15.5)
WBC: 6.9 10*3/uL (ref 4.0–10.5)

## 2016-03-10 LAB — HEMOGLOBIN A1C: Hgb A1c MFr Bld: 7.4 % — ABNORMAL HIGH (ref 4.6–6.5)

## 2016-03-10 LAB — TSH: TSH: 1.4 u[IU]/mL (ref 0.35–4.50)

## 2016-03-10 NOTE — Progress Notes (Addendum)
[name], MD Phone: [phone]  Debra Saunders is a 70 y.o. female who presents today for follow-up.  Patient seen one week ago for UTI symptoms with burning and frequency. Found to have possible UTI and treated with Levaquin. Also thought to have possible cellulitis in left lower extremity as she had swelling and erythema. She does have a chronic DVT in her left lower extremity where the swelling was worse and they reported erythema and possible blistering over the anterior portion of her left lower leg. She notes the swelling is improved back to baseline. Redness is gone. Blistering is gone. She notes no dysuria or frequency though does have chronic urgency and urge incontinence chronically. Has had some sweats and felt feverish with chills and shakes. No abdominal pain.  Did have an episode of chest discomfort on Saturday that started with right arm pain and moved into her chest. Felt as though it was a squeezing discomfort in her arm. She was sweaty with this. No difference in her breathing. Had to lay down on the sofa. Has not recurred since then. Has had a history of atypical musculoskeletal chest pain. Notes she had soreness in her chest after this.  PMH: Former smoker   ROS see history of present illness  Objective  Physical Exam Vitals:   03/10/16 1340  BP: 130/62  Pulse: 69  Temp: 98.3 F (36.8 C)    BP Readings from Last 3 Encounters:  03/10/16 130/62  03/03/16 124/74  01/24/16 (!) 152/90   Wt Readings from Last 3 Encounters:  03/10/16 233 lb (105.7 kg)  03/03/16 236 lb (107 kg)  01/31/16 237 lb 6.4 oz (107.7 kg)    Physical Exam  Constitutional: No distress.  HENT:  Head: Normocephalic and atraumatic.  Cardiovascular: Normal rate, regular rhythm and normal heart sounds.   Pulmonary/Chest: Effort normal and breath sounds normal.  Abdominal: Soft. Bowel sounds are normal. She exhibits no distension. There is no tenderness. There is no rebound and no  guarding.  Musculoskeletal:  Left leg swelling noted with no erythema or tenderness, right leg with no swelling  Neurological: She is alert. Gait normal.  Skin: She is not diaphoretic.   Addendum: patient was noted to have right chest muscular tenderness on exam  EKG: Normal sinus rhythm, rate 71, no ST or T-wave changes compared to previously  Assessment/Plan: Please see individual problem list.  Pain in the chest Patient's chest pain could potentially be related to her chronic atypical musculoskeletal chest pain. Does have some typical features that could be consistent with cardiac cause. EKG performed today overall reassuring and unchanged from previously. Asymptomatic over the last 2 days. Benign lung exam. Vital signs are stable. Unlikely VTE given stable vital signs and IVC filter. We'll forward EKG to patient's cardiologist to review. She'll continue to monitor. She's given return precautions.  UTI (urinary tract infection) Overall improved. Still some vague urinary complaints. We'll recheck a UA today and send for culture to confirm clearing of infection. She'll monitor for recurrence. We'll refer to urology at her request for chronic urinary urge incontinence. Given return precautions.  Chronic deep vein thrombosis (DVT) (HCC) Suspect patient's swelling and erythema was likely related to cellulitis given the rapid improvement with Levaquin. Suspect less likely related to thrombosis given rapid improvement with antibiotics. She will continue to monitor for recurrence.  We'll also obtain some lab work as outlined below given her report of intermittent feeling cold and having shakes.  Orders Placed This Encounter  Procedures  . Urine Culture  . Comp Met (CMET)  . CBC  . HgB A1c  . TSH  . Ambulatory referral to Urology    Referral Priority:   Routine    Referral Type:   Consultation    Referral Reason:   Specialty Services Required    Requested Specialty:   Urology    Number  of Visits Requested:   1  . POCT Urinalysis Dipstick  . EKG 12-Lead    [name], MD [place]

## 2016-03-10 NOTE — Assessment & Plan Note (Signed)
Suspect patient's swelling and erythema was likely related to cellulitis given the rapid improvement with Levaquin. Suspect less likely related to thrombosis given rapid improvement with antibiotics. She will continue to monitor for recurrence.

## 2016-03-10 NOTE — Assessment & Plan Note (Signed)
Patient's chest pain could potentially be related to her chronic atypical musculoskeletal chest pain. Does have some typical features that could be consistent with cardiac cause. EKG performed today overall reassuring and unchanged from previously. Asymptomatic over the last 2 days. Benign lung exam. Vital signs are stable. Unlikely VTE given stable vital signs and IVC filter. We'll forward EKG to patient's cardiologist to review. She'll continue to monitor. She's given return precautions.

## 2016-03-10 NOTE — Patient Instructions (Addendum)
Nice to see you. We are going to recheck your urine today. Please monitor your leg and if there is worsening swelling or redness please let us know. We will check some lab work as well today and call you with the results. If you develop chest pain, worsening breathing issues, cough productive of blood, fevers, abdominal pain, or any new or changing symptoms please seek medical attention.

## 2016-03-10 NOTE — Assessment & Plan Note (Signed)
Overall improved. Still some vague urinary complaints. We'll recheck a UA today and send for culture to confirm clearing of infection. She'll monitor for recurrence. We'll refer to urology at her request for chronic urinary urge incontinence. Given return precautions.

## 2016-03-13 LAB — URINE CULTURE

## 2016-03-14 ENCOUNTER — Telehealth: Payer: Self-pay | Admitting: Internal Medicine

## 2016-03-14 NOTE — Telephone Encounter (Signed)
Informed them pt is not taking the Letaris at this time. Nothing further needed.

## 2016-03-14 NOTE — Telephone Encounter (Signed)
Leap coordinator from [facility] calling just needing to verify if patient is still taking  Letiras Please call back.

## 2016-03-16 DIAGNOSIS — I509 Heart failure, unspecified: Secondary | ICD-10-CM | POA: Diagnosis not present

## 2016-03-18 ENCOUNTER — Other Ambulatory Visit: Payer: Self-pay | Admitting: Family Medicine

## 2016-03-18 DIAGNOSIS — R945 Abnormal results of liver function studies: Principal | ICD-10-CM

## 2016-03-18 DIAGNOSIS — R7989 Other specified abnormal findings of blood chemistry: Secondary | ICD-10-CM

## 2016-03-26 ENCOUNTER — Ambulatory Visit: Payer: Commercial Managed Care - HMO | Admitting: Urology

## 2016-03-26 ENCOUNTER — Encounter: Payer: Self-pay | Admitting: Urology

## 2016-03-26 ENCOUNTER — Telehealth: Payer: Self-pay | Admitting: Urology

## 2016-03-26 VITALS — BP 139/83 | HR 67 | Ht 60.0 in | Wt 233.1 lb

## 2016-03-26 DIAGNOSIS — N952 Postmenopausal atrophic vaginitis: Secondary | ICD-10-CM

## 2016-03-26 DIAGNOSIS — N764 Abscess of vulva: Secondary | ICD-10-CM

## 2016-03-26 DIAGNOSIS — N3946 Mixed incontinence: Secondary | ICD-10-CM | POA: Diagnosis not present

## 2016-03-26 DIAGNOSIS — R350 Frequency of micturition: Secondary | ICD-10-CM | POA: Diagnosis not present

## 2016-03-26 LAB — BLADDER SCAN AMB NON-IMAGING: Scan Result: 14

## 2016-03-26 MED ORDER — ESTROGENS, CONJUGATED 0.625 MG/GM VA CREA: 1.0000 | TOPICAL_CREAM | Freq: Every day | VAGINAL | 12 refills | Status: DC

## 2016-03-26 MED ORDER — PRASTERONE 6.5 MG VA INST: 1.0000 | VAGINAL_INSERT | Freq: Every day | VAGINAL | 12 refills | Status: DC

## 2016-03-26 MED ORDER — ESTRADIOL 0.1 MG/GM VA CREA: TOPICAL_CREAM | VAGINAL | 12 refills | Status: DC

## 2016-03-26 NOTE — Progress Notes (Signed)
03/26/2016 11:55 AM   Debra Saunders Debra Saunders 06/04/1946 [id number]  Referring provider: [name], MD [address] [place], [place] [address]  Chief Complaint  Patient presents with  . Urinary Incontinence    new patient referred by Dr. [name]    HPI: Patient is a 70 -year-old [place] American female who presents with her daughter, [name], as a referral to [place] by, Dr. [name], for urinary incontinence.  Patient states that she has had urinary incontinence for 6 months.   She started fluid pills two years ago.  She has recently reduced her dose in an effort to decrease her incontinence.    Patient has incontinence with laughter, cough, etc.  She is also experiencing urge incontinence.  She is experiencing 8 incontinent episodes during the day. She is experiencing 2 incontinent episodes during the night.  She does have sleep apnea and is sleeping with a CPAP machine.    Her incontinence volume is large, soaking through clothes.   She is wearing 15-18 pads/depends daily.    She is having associated urinary frequency, urgency, nocturia x 2 and weak urinary stream.   She is also waking up at night soaked.    She is not having associated dysuria, intermittency, hesitancy or straining to urinate.    She does/does not have a history of recurrent urinary tract infections, STI's or injury to the bladder.  She did have an E.Coli UTI on 03/03/2016.  She was having dysuria at that time.    She endorses suprapubic pain, back pain, abdominal pain or flank pain, but she denies any current dysuria, gross hematuria, back pain, abdominal pain or flank pain.   She has not had any recent fevers, chills, nausea or vomiting.   She does not have a history of nephrolithiasis, GU surgery or GU trauma.   She is not sexually active.  She is post menopausal.   She admits to constipation.    She has not had any recent imaging studies.    She is drinking less than 12 oz of water daily.    She is drinking one cup caffeinated beverages daily.  She is not drinking alcoholic beverages daily.    Her risk factors for incontinence are obesity, age, caffeine, diabetes (recent Hgb A1c 7.4%) stroke, vaginal atrophy and pelvic surgery.    She is taking opioids, ACE inhibitors and diuretics.    Her PVR is 14 mL on today's exam.     PMH: Past Medical History:  Diagnosis Date  . Acute renal failure ([place])   . Arthritis   . CHF (congestive heart failure) ([place]) 2005   Dx at [facility]  . Chronic back pain   . Chronic respiratory failure (HCC)    a. on home O2  . COPD (chronic obstructive pulmonary disease) ([place])    2L [place]  . Diabetes mellitus ([place])    type II  . Gastritis   . Hyperlipidemia   . Hypertension   . Lipoma of back   . Obesity   . Sepsis with metabolic encephalopathy ([place])   . Sleep apnea   . Sleep apnea   . Streptococcal bacteremia   . Stroke ([place])   . Symptomatic bradycardia   . Thyroid disorder   . Thyroid nodule   . Urinary incontinence     Surgical History: Past Surgical History:  Procedure Laterality Date  . ABDOMINAL HYSTERECTOMY  1969  . CARDIAC CATHETERIZATION  2013   @ [place]: No obstructive CAD: Only  20% ostial left circumflex.  . CHOLECYSTECTOMY    . CYST REMOVAL TRUNK  2002   BACK  . INSERT / REPLACE / REMOVE PACEMAKER    . lipoma removal  2014   back  . PACEMAKER INSERTION  [address] Scientific dual chamber pacemaker implanted by Dr Caryl Comes for symptomatic bradycardia  . PERMANENT PACEMAKER INSERTION N/A 01/09/2014   Procedure: PERMANENT PACEMAKER INSERTION;  Surgeon: Deboraha Sprang, MD;  Location: [facility] CATH LAB;  Service: Cardiovascular;  Laterality: N/A;  . TRIGGER FINGER RELEASE    . VAGINAL HYSTERECTOMY      Home Medications:    Medication List       Accurate as of 03/26/16 11:55 AM. Always use your most recent med list.          acetaminophen 325 MG tablet Commonly known as:  TYLENOL Take 325-650 mg by mouth every 6 (six)  hours as needed for mild pain. Reported on 10/10/2015   albuterol (2.5 MG/3ML) 0.083% nebulizer solution Commonly known as:  PROVENTIL Take 3 mLs (2.5 mg total) by nebulization every 6 (six) hours as needed for wheezing or shortness of breath.   BIOFREEZE EX Apply 1 application topically as needed (for pain).   conjugated estrogens vaginal cream Commonly known as:  PREMARIN Place 1 Applicatorful vaginally daily. Apply 0.19m (pea-sized amount)  just inside the vaginal introitus with a finger-tip every night for two weeks and then Monday, Wednesday and Friday nights.   cyclobenzaprine 5 MG tablet Commonly known as:  FLEXERIL Take 1 tablet (5 mg total) by mouth 3 (three) times daily as needed for muscle spasms.   EASY TOUCH INSULIN SYRINGE 29G X 1/2" 1 ML Misc Generic drug:  INSULIN SYRINGE 1CC/29G USE AS DIRECTED WITH  LEVEMIR  INSULIN   estradiol 0.1 MG/GM vaginal cream Commonly known as:  ESTRACE VAGINAL Apply 0.568m(pea-sized amount)  just inside the vaginal introitus with a finger-tip every night for two weeks and then Monday, Wednesday and Friday nights.   gabapentin 100 MG capsule Commonly known as:  NEURONTIN Take 200 mg by mouth 3 (three) times daily.   glipiZIDE 5 MG tablet Commonly known as:  GLUCOTROL Take 0.5 tablets (2.5 mg total) by mouth daily before breakfast.   glucose blood test strip Use as instructed to check blood glucose three times a day. e11.8   insulin detemir 100 UNIT/ML injection Commonly known as:  LEVEMIR Inject 0.18 mLs (18 Units total) into the skin at bedtime.   KLOR-CON M20 20 MEQ tablet Generic drug:  potassium chloride SA TAKE ONE TABLET BY MOUTH TWICE DAILY   Lancets Misc Use as directed to check blood sugar three times daily e11.8   losartan 50 MG tablet Commonly known as:  COZAAR Take 1 tablet (50 mg total) by mouth daily.   meclizine 25 MG tablet Commonly known as:  ANTIVERT Take 25 mg by mouth daily as needed for dizziness.  Reported on 10/10/2015   naproxen 500 MG EC tablet Commonly known as:  EC NAPROSYN Take 500 mg by mouth 2 (two) times daily as needed (for pain). Reported on 01/04/2016   ondansetron 8 MG tablet Commonly known as:  ZOFRAN Take 1 tablet (8 mg total) by mouth every 8 (eight) hours as needed for nausea or vomiting.   oxyCODONE-acetaminophen 5-325 MG tablet Commonly known as:  PERCOCET/ROXICET Take 1 tablet by mouth every 8 (eight) hours as needed for severe pain.   OXYGEN Inhale into the lungs. 3 liters  polyethylene glycol packet Commonly known as:  MIRALAX / GLYCOLAX Take 17 g by mouth daily as needed for moderate constipation.   promethazine 25 MG tablet Commonly known as:  PHENERGAN Take 25 mg by mouth every 4 (four) hours as needed for nausea or vomiting. Reported on 10/10/2015   torsemide 20 MG tablet Commonly known as:  DEMADEX Take 1 tablet (20 mg total) by mouth 2 (two) times daily.   TRUE METRIX AIR GLUCOSE METER w/Device Kit USE AS DIRECTED   verapamil 240 MG 24 hr capsule Commonly known as:  VERELAN PM TAKE ONE CAPSULE BY MOUTH ONCE DAILY       Allergies:  Allergies  Allergen Reactions  . Penicillins Hives, Shortness Of Breath, Swelling and Other (See Comments)    Has patient had a PCN reaction causing immediate rash, facial/tongue/throat swelling, SOB or lightheadedness with hypotension: Yes Has patient had a PCN reaction causing severe rash involving mucus membranes or skin necrosis: No Has patient had a PCN reaction that required hospitalization No Has patient had a PCN reaction occurring within the last 10 years: No If all of the above answers are "NO", then may proceed with Cephalosporin use.  . Aspirin Hives and Other (See Comments)    Pt states that she is unable to take because she had bleeding in her brain.    . Other Other (See Comments)    Pt states that she is unable to take blood thinners because she had bleeding in her brain.     Family  History: Family History  Problem Relation Age of Onset  . Heart disease Mother   . Hypertension Mother   . COPD Mother     was a smoker  . Thyroid disease Mother   . Hypertension Father   . Hypertension Sister   . Thyroid disease Sister   . Hypertension Sister   . Thyroid disease Sister   . Breast cancer Maternal Aunt   . Kidney disease Neg Hx   . Bladder Cancer Neg Hx     Social History:  reports that she quit smoking about 25 years ago. Her smoking use included Cigarettes. She has a 0.60 pack-year smoking history. She has never used smokeless tobacco. She reports that she does not drink alcohol or use drugs.  ROS: UROLOGY Frequent Urination?: Yes Hard to postpone urination?: Yes Burning/pain with urination?: No Get up at night to urinate?: Yes Leakage of urine?: Yes Urine stream starts and stops?: No Trouble starting stream?: No Do you have to strain to urinate?: No Blood in urine?: No Urinary tract infection?: Yes Sexually transmitted disease?: No Injury to kidneys or bladder?: No Painful intercourse?: No Weak stream?: Yes Currently pregnant?: No Vaginal bleeding?: No Last menstrual period?: n  Gastrointestinal Nausea?: Yes Vomiting?: No Indigestion/heartburn?: Yes Diarrhea?: No Constipation?: No  Constitutional Fever: No Night sweats?: Yes Weight loss?: No Fatigue?: Yes  Skin Skin rash/lesions?: No Itching?: No  Eyes Blurred vision?: Yes Double vision?: Yes  Ears/Nose/Throat Sore throat?: Yes Sinus problems?: No  Hematologic/Lymphatic Swollen glands?: No Easy bruising?: No  Cardiovascular Leg swelling?: Yes Chest pain?: No  Respiratory Cough?: Yes Shortness of breath?: Yes  Endocrine Excessive thirst?: No  Musculoskeletal Back pain?: Yes Joint pain?: No  Neurological Headaches?: No Dizziness?: Yes  Psychologic Depression?: No Anxiety?: No  Physical Exam: BP 139/83   Pulse 67   Ht 5' (1.524 m)   Wt 233 lb 1.6 oz (105.7  kg)   BMI 45.52 kg/m   Constitutional: Well  nourished. Alert and oriented, No acute distress. HEENT: [place] AT, moist mucus membranes. Trachea midline, no masses. Cardiovascular: No clubbing, cyanosis, or edema. Respiratory: Normal respiratory effort, no increased work of breathing. GI: Abdomen is soft, non tender, non distended, no abdominal masses. Liver and spleen not palpable.  No hernias appreciated.  Stool sample for occult testing is not indicated.   GU: No CVA tenderness.  No bladder fullness or masses.  Atrophic external genitalia, normal pubic hair distribution, no lesions.  Normal urethral meatus, no lesions, no prolapse, no discharge.   No urethral masses, tenderness and/or tenderness. No bladder fullness, tenderness or masses. Pale vagina mucosa, poor estrogen effect, no discharge, no lesions, good pelvic support, no cystocele or rectocele noted.  Right labia with an sebaceous abscess.  Stated she get boils all the time down there.  No erythema, not fluctuant and no crepitus.  7 cm x 5 cm with a central area of remote drainage.  Patient states it drained over night.  Cervix and uterus are surgically absent.   Anus and perineum are without rashes or lesions.    Skin: No rashes, bruises or suspicious lesions. Lymph: No cervical or inguinal adenopathy. Neurologic: Grossly intact, no focal deficits, moving all 4 extremities. Psychiatric: Normal mood and affect.  Laboratory Data: Lab Results  Component Value Date   WBC 6.9 03/10/2016   HGB 12.2 03/10/2016   HCT 38.1 03/10/2016   MCV 82.8 03/10/2016   PLT 285.0 03/10/2016    Lab Results  Component Value Date   CREATININE 0.91 03/10/2016    Lab Results  Component Value Date   HGBA1C 7.4 (H) 03/10/2016    Lab Results  Component Value Date   TSH 1.40 03/10/2016       Component Value Date/Time   CHOL 160 03/28/2015 0415   CHOL 137 02/03/2013 0441   HDL 48 03/28/2015 0415   HDL 25 (L) 02/03/2013 0441   CHOLHDL 3.3  03/28/2015 0415   VLDL 21 03/28/2015 0415   VLDL 39 02/03/2013 0441   LDLCALC 91 03/28/2015 0415   LDLCALC 73 02/03/2013 0441    Lab Results  Component Value Date   AST 45 (H) 03/10/2016   Lab Results  Component Value Date   ALT 38 (H) 03/10/2016    Pertinent Imaging: Results for SINIYAH, EVANGELIST (MRN [id number]) as of 03/26/2016 11:36  Ref. Range 03/26/2016 11:14  Scan Result Unknown 14    Assessment & Plan:    1. Mixed urinary incontinence  - discussed behavioral therapies  - discussed bladder training  - discussed bladder control strategies   - discussed pelvic floor muscle training - will consider in the future if Myrbetriq is ineffective  - fluid management - patient has been restricting her fluid due to her incontinence- no relief in incontinence  - offered medical therapy with beta-3 adrenergic receptor agonist and the potential side effects- samples of Myrbetriq 25 mg daily given  - would like to try the beta-3 adrenergic receptor agonist (Myrbetriq).  Given Myrbetriq 25 mg samples, #28.  I have reviewed with the patient of the side effects of Myrbetriq, such as: elevation in BP, urinary retention and/or HA.  She will return in 3 weeks for PVR and symptom recheck.    - RTC in 3 weeks for PVR and symptom recheck   - BLADDER SCAN AMB NON-IMAGING  2. Vaginal atrophy  - I explained to the patient that when women go through menopause and her estrogen levels are severely diminished,  the normal vaginal flora will change.  This is due to an increase of the vaginal canal's pH. Because of this, the vaginal canal may be colonized by bacteria from the rectum instead of the protective lactobacillus.  This accompanied by the loss of the mucus barrier with vaginal atrophy is a cause of urinary tract infections.  It can also contribute to incontinence issues with the weakening of the vaginal tissue.    - A new medication, prasterone ([place]) is a vaginal suppository containing DHEA also  changes the vaginal pH.  This could be an alternative for women who are hesitant in using estrogen containing products or it is contraindicated.     - I have given her a prescription for prasterone [facility]).  If she finds the medication too expensive, please call the office at [phone] for an alternative.    3. Labial abscess  - refer to gynecology  - Advised to contact our office or seek treatment in the ED if becomes febrile, experiences chills, the abscess increases in size or pain/vomiting are difficult control in order to arrange for emergent/urgent intervention  Return in about 3 weeks (around 04/16/2016) for PVR and symptom recheck.  These notes generated with voice recognition software. I apologize for typographical errors.  Zara Council, [facility] [address] [place], [place] [address] [phone]

## 2016-03-26 NOTE — Telephone Encounter (Signed)
prasterone [facility] [facility]).

## 2016-03-26 NOTE — Telephone Encounter (Signed)
Okay.  What she can purchase is DHEA 50 mg tablets.  I use the Natrol brand and she can place one tablet in her vagina nightly.  I know that these tablets will dissolve.

## 2016-03-26 NOTE — Telephone Encounter (Signed)
Pt went to get rx filled and it was $188.  She was told to call office if this was too expensive.  [phone]

## 2016-03-26 NOTE — Patient Instructions (Addendum)
Please call if the [place] Reek is too expensive.  [phone]   Mirabegron extended-release tablets What is this medicine? MIRABEGRON (MIR a BEG ron) is used to treat overactive bladder. This medicine reduces the amount of bathroom visits. It may also help to control wetting accidents. This medicine may be used for other purposes; ask your health care provider or pharmacist if you have questions. What should I tell my health care provider before I take this medicine? They need to know if you have any of these conditions: -difficulty passing urine -high blood pressure -kidney disease -liver disease -an unusual or allergic reaction to mirabegron, other medicines, foods, dyes, or preservatives -pregnant or trying to get pregnant -breast-feeding How should I use this medicine? Take this medicine by mouth with a glass of water. Follow the directions on the prescription label. Do not cut, crush or chew this medicine. You can take it with or without food. If it upsets your stomach, take it with food. Take your medicine at regular intervals. Do not take it more often than directed. Do not stop taking except on your doctor's advice. Talk to your pediatrician regarding the use of this medicine in children. Special care may be needed. Overdosage: If you think you have taken too much of this medicine contact a poison control center or emergency room at once. NOTE: This medicine is only for you. Do not share this medicine with others. What if I miss a dose? If you miss a dose, take it as soon as you can. If it is almost time for your next dose, take only that dose. Do not take double or extra doses. What may interact with this medicine? -certain medicines for bladder problems like fesoterodine, oxybutynin, solifenacin, tolterodine -desipramine -digoxin -flecainide -ketoconazole -MAOIs like Carbex, Eldepryl, Marplan, Nardil, and Parnate -metoprolol -propafenone -thioridazine -warfarin This list  may not describe all possible interactions. Give your health care provider a list of all the medicines, herbs, non-prescription drugs, or dietary supplements you use. Also tell them if you smoke, drink alcohol, or use illegal drugs. Some items may interact with your medicine. What should I watch for while using this medicine? It may take 8 weeks to notice the full benefit from this medicine. You may need to limit your intake tea, coffee, caffeinated sodas, and alcohol. These drinks may make your symptoms worse. Visit your doctor or health care professional for regular checks on your progress. Check your blood pressure as directed. Ask your doctor or health care professional what your blood pressure should be and when you should contact him or her. What side effects may I notice from receiving this medicine? Side effects that you should report to your doctor or health care professional as soon as possible: -allergic reactions like skin rash, itching or hives, swelling of the face, lips, or tongue -chest pain or palpitations -severe or sudden headache -high blood pressure -fast, irregular heartbeat -redness, blistering, peeling or loosening of the skin, including inside the mouth -signs of infection like fever or chills; cough; sore throat; pain or difficulty passing urine -trouble passing urine or change in the amount of urine Side effects that usually do not require medical attention (Report these to your doctor or health care professional if they continue or are bothersome.): -constipation -diarrhea -dizziness -dry eyes -joint pain -mild headache -nausea -runny nose This list may not describe all possible side effects. Call your doctor for medical advice about side effects. You may report side effects to FDA at 1-800-FDA-1088. Where should  I keep my medicine? Keep out of the reach of children. Store at room temperature between 15 and 30 degrees C (59 and 86 degrees F). Throw away any  unused medicine after the expiration date. NOTE: This sheet is a summary. It may not cover all possible information. If you have questions about this medicine, talk to your doctor, pharmacist, or health care provider.    2016, Elsevier/Gold Standard. (2015-02-08 10:22:20)

## 2016-03-27 DIAGNOSIS — J9611 Chronic respiratory failure with hypoxia: Secondary | ICD-10-CM | POA: Diagnosis not present

## 2016-04-01 NOTE — Telephone Encounter (Signed)
Spoke with pt in reference to [facility]. Made pt aware to get Natrol brand 50mg  and insert in vagina to dissolve. Pt voiced understanding.

## 2016-04-05 ENCOUNTER — Other Ambulatory Visit: Payer: Self-pay | Admitting: Family Medicine

## 2016-04-07 ENCOUNTER — Encounter: Payer: Self-pay | Admitting: Urology

## 2016-04-07 NOTE — Telephone Encounter (Signed)
Refill sent to pharmacy.   

## 2016-04-07 NOTE — Telephone Encounter (Signed)
Can we refill this? 

## 2016-04-15 DIAGNOSIS — I509 Heart failure, unspecified: Secondary | ICD-10-CM | POA: Diagnosis not present

## 2016-04-16 ENCOUNTER — Ambulatory Visit: Payer: Commercial Managed Care - HMO | Admitting: Urology

## 2016-04-16 ENCOUNTER — Encounter: Payer: Self-pay | Admitting: Urology

## 2016-04-16 VITALS — BP 174/81 | HR 71 | Ht 60.0 in | Wt 232.0 lb

## 2016-04-16 DIAGNOSIS — N3946 Mixed incontinence: Secondary | ICD-10-CM

## 2016-04-16 DIAGNOSIS — N764 Abscess of vulva: Secondary | ICD-10-CM | POA: Diagnosis not present

## 2016-04-16 DIAGNOSIS — N952 Postmenopausal atrophic vaginitis: Secondary | ICD-10-CM

## 2016-04-16 LAB — BLADDER SCAN AMB NON-IMAGING: Scan Result: 0

## 2016-04-16 NOTE — Progress Notes (Signed)
04/16/2016 11:30 PM   Debra Saunders March 30, 1946 [id number]  Referring provider: [name], MD [address] [place], [place] [address]  Chief Complaint  Patient presents with  . Follow-up    3 week for mixed incontinence and labial abscess    HPI: Patient is a 70 -year-old [place] American female who presents today with her daughter, [name], for a three week follow up after a trial of Myrbetriq 25 mg daily.    Background Patient was referral to [place] by, Dr. [name], for urinary incontinence.  Patient states that she has had urinary incontinence for 6 months.   She started fluid pills two years ago.  She has recently reduced her dose in an effort to decrease her incontinence.  Patient has incontinence with laughter, cough, etc.  She is also experiencing urge incontinence.  She is experiencing 8 incontinent episodes during the day. She is experiencing 2 incontinent episodes during the night.  She does have sleep apnea and is sleeping with a CPAP machine.   Her incontinence volume is large, soaking through clothes.   She is wearing 15-18 pads/depends daily.   She is having associated urinary frequency, urgency, nocturia x 2 and weak urinary stream.   She is also waking up at night soaked.   She is not having associated dysuria, intermittency, hesitancy or straining to urinate.  She does/does not have a history of recurrent urinary tract infections, STI's or injury to the bladder.  She did have an E.Coli UTI on 03/03/2016.  She was having dysuria at that time.  She endorses suprapubic pain, back pain, abdominal pain or flank pain, but she denies any current dysuria, gross hematuria, back pain, abdominal pain or flank pain.  She has not had any recent fevers, chills, nausea or vomiting.  She does not have a history of nephrolithiasis, GU surgery or GU trauma.  She is not sexually active.  She is post menopausal.  She admits to constipation.  She has not had any recent imaging  studies.   She is drinking less than 12 oz of water daily.   She is drinking one cup caffeinated beverages daily.  She is not drinking alcoholic beverages daily.   Her risk factors for incontinence are obesity, age, caffeine, diabetes (recent Hgb A1c 7.4%) stroke, vaginal atrophy and pelvic surgery.  She is taking opioids, ACE inhibitors and diuretics.  Her PVR is 14 mL on today's exam.    Today, she states she noted no difference with the Myrbetriq.  Her BP today was 174/81.  She is still experiencing frequency, urgency and incontinence.  She has not had gross hematuria, dysuria or suprapubic pain.  She has not had fevers, chills, nausea or vomiting.    She is bothered a great deal with urgency, incontinence and nocturia.    PMH: Past Medical History:  Diagnosis Date  . Acute renal failure ([facility])   . Arthritis   . CHF (congestive heart failure) ([facility]) 2005   Dx at [facility]  . Chronic back pain   . Chronic respiratory failure ([facility])    a. on home O2  . COPD (chronic obstructive pulmonary disease) ([facility])    2L [place]  . Diabetes mellitus ([facility])    type II  . Gastritis   . Hyperlipidemia   . Hypertension   . Lipoma of back   . Obesity   . Sepsis with metabolic encephalopathy ([facility])   . Sleep apnea   . Sleep apnea   .  Streptococcal bacteremia   . Stroke ([facility])   . Symptomatic bradycardia   . Thyroid disorder   . Thyroid nodule   . Urinary incontinence     Surgical History: Past Surgical History:  Procedure Laterality Date  . ABDOMINAL HYSTERECTOMY  1969  . CARDIAC CATHETERIZATION  2013   @ [facility]: No obstructive CAD: Only 20% ostial left circumflex.  . CHOLECYSTECTOMY    . CYST REMOVAL TRUNK  2002   BACK  . INSERT / REPLACE / REMOVE PACEMAKER    . lipoma removal  2014   back  . PACEMAKER INSERTION  [address] [employer] dual chamber pacemaker implanted by Dr Caryl Comes for symptomatic bradycardia  . PERMANENT PACEMAKER INSERTION N/A 01/09/2014   Procedure: PERMANENT PACEMAKER INSERTION;   Surgeon: Deboraha Sprang, MD;  Location[facility] C[facility] LAB;  Service: Cardiovascular;  Laterality: N/A;  . TRIGGER FINGER RELEASE    . VAGINAL HYSTERECTOMY      Home Medications:    Medication List       Accurate as of 04/16/16 11:59 PM. Always use your most recent med list.          acetaminophen 325 MG tablet Commonly known as:  TYLENOL Take 325-650 mg by mouth every 6 (six) hours as needed for mild pain. Reported on 10/10/2015   albuterol (2.5 MG/3ML) 0.083% nebulizer solution Commonly known as:  PROVENTIL Take 3 mLs (2.5 mg total) by nebulization every 6 (six) hours as needed for wheezing or shortness of breath.   BIOFREEZE EX Apply 1 application topically as needed (for pain).   cyclobenzaprine 5 MG tablet Commonly known as:  FLEXERIL Take 1 tablet (5 mg total) by mouth 3 (three) times daily as needed for muscle spasms.   EASY TOUCH INSULIN SYRINGE 29G X 1/2" 1 ML Misc Generic drug:  INSULIN SYRINGE 1CC/29G USE AS DIRECTED WITH  LEVEMIR  INSULIN   gabapentin 100 MG capsule Commonly known as:  NEURONTIN Take 200 mg by mouth 3 (three) times daily.   glipiZIDE 5 MG tablet Commonly known as:  GLUCOTROL Take 0.5 tablets (2.5 mg total) by mouth daily before breakfast.   glucose blood test strip Use as instructed to check blood glucose three times a day. e11.8   insulin detemir 100 UNIT/ML injection Commonly known as:  LEVEMIR Inject 0.18 mLs (18 Units total) into the skin at bedtime.   KLOR-CON M20 20 MEQ tablet Generic drug:  potassium chloride SA TAKE ONE TABLET BY MOUTH TWICE DAILY   Lancets Misc Use as directed to check blood sugar three times daily e11.8   losartan 50 MG tablet Commonly known as:  COZAAR Take 1 tablet (50 mg total) by mouth daily.   meclizine 25 MG tablet Commonly known as:  ANTIVERT Take 25 mg by mouth daily as needed for dizziness. Reported on 10/10/2015   naproxen 500 MG EC tablet Commonly known as:  EC NAPROSYN Take 500 mg by mouth 2  (two) times daily as needed (for pain). Reported on 01/04/2016   ondansetron 8 MG tablet Commonly known as:  ZOFRAN Take 1 tablet (8 mg total) by mouth every 8 (eight) hours as needed for nausea or vomiting.   oxyCODONE-acetaminophen 5-325 MG tablet Commonly known as:  PERCOCET/ROXICET Take 1 tablet by mouth every 8 (eight) hours as needed for severe pain.   OXYGEN Inhale into the lungs. 3 liters   polyethylene glycol packet Commonly known as:  MIRALAX / GLYCOLAX Take 17 g by mouth daily as needed  for moderate constipation.   Prasterone 6.5 MG Inst Commonly known as:  INTRAROSA Place 1 suppository vaginally daily.   promethazine 25 MG tablet Commonly known as:  PHENERGAN Take 25 mg by mouth every 4 (four) hours as needed for nausea or vomiting. Reported on 10/10/2015   torsemide 20 MG tablet Commonly known as:  DEMADEX Take 1 tablet (20 mg total) by mouth 2 (two) times daily.   TRUE METRIX AIR GLUCOSE METER w/Device Kit USE AS DIRECTED   verapamil 240 MG 24 hr capsule Commonly known as:  VERELAN PM TAKE ONE CAPSULE BY MOUTH ONCE DAILY       Allergies:  Allergies  Allergen Reactions  . Penicillins Hives, Shortness Of Breath, Swelling and Other (See Comments)    Has patient had a PCN reaction causing immediate rash, facial/tongue/throat swelling, SOB or lightheadedness with hypotension: Yes Has patient had a PCN reaction causing severe rash involving mucus membranes or skin necrosis: No Has patient had a PCN reaction that required hospitalization No Has patient had a PCN reaction occurring within the last 10 years: No If all of the above answers are "NO", then may proceed with Cephalosporin use.  . Aspirin Hives and Other (See Comments)    Pt states that she is unable to take because she had bleeding in her brain.    . Other Other (See Comments)    Pt states that she is unable to take blood thinners because she had bleeding in her brain.     Family History: Family  History  Problem Relation Age of Onset  . Heart disease Mother   . Hypertension Mother   . COPD Mother     was a smoker  . Thyroid disease Mother   . Hypertension Father   . Hypertension Sister   . Thyroid disease Sister   . Hypertension Sister   . Thyroid disease Sister   . Breast cancer Maternal Aunt   . Kidney disease Neg Hx   . Bladder Cancer Neg Hx     Social History:  reports that she quit smoking about 25 years ago. Her smoking use included Cigarettes. She has a 0.60 pack-year smoking history. She has never used smokeless tobacco. She reports that she does not drink alcohol or use drugs.  ROS: UROLOGY Frequent Urination?: Yes Hard to postpone urination?: Yes Burning/pain with urination?: No Get up at night to urinate?: No Leakage of urine?: Yes Urine stream starts and stops?: No Trouble starting stream?: No Do you have to strain to urinate?: No Blood in urine?: No Urinary tract infection?: No Sexually transmitted disease?: No Injury to kidneys or bladder?: No Painful intercourse?: No Weak stream?: No Currently pregnant?: No Vaginal bleeding?: No Last menstrual period?: n  Gastrointestinal Nausea?: No Vomiting?: No Indigestion/heartburn?: No Diarrhea?: No Constipation?: No  Constitutional Fever: No Night sweats?: No Weight loss?: No Fatigue?: No  Skin Skin rash/lesions?: No Itching?: No  Eyes Blurred vision?: No Double vision?: No  Ears/Nose/Throat Sore throat?: No Sinus problems?: No  Hematologic/Lymphatic Swollen glands?: No Easy bruising?: No  Cardiovascular Leg swelling?: Yes Chest pain?: No  Respiratory Cough?: No Shortness of breath?: Yes  Endocrine Excessive thirst?: No  Musculoskeletal Back pain?: No Joint pain?: No  Neurological Headaches?: No Dizziness?: No  Psychologic Depression?: No Anxiety?: No  Physical Exam: BP (!) 174/81   Pulse 71   Ht 5' (1.524 m)   Wt 232 lb (105.2 kg)   BMI 45.31 kg/m     Constitutional: Well nourished. Alert  and oriented, No acute distress. HEENT: Ball AT, moist mucus membranes. Trachea midline, no masses. Cardiovascular: No clubbing, cyanosis, or edema. Respiratory: Normal respiratory effort, no increased work of breathing. GI: Abdomen is soft, non tender, non distended, no abdominal masses. Liver and spleen not palpable.  No hernias appreciated.  Stool sample for occult testing is not indicated.   GU: No CVA tenderness.  No bladder fullness or masses.  Atrophic external genitalia, normal pubic hair distribution, no lesions.  Normal urethral meatus, no lesions, no prolapse, no discharge.   No urethral masses, tenderness and/or tenderness. No bladder fullness, tenderness or masses. Pale vagina mucosa, poor estrogen effect, no discharge, no lesions, good pelvic support, no cystocele or rectocele noted.  Right labial abscess is resolving.  No fluctuance and no crepitus.  Cervix and uterus are surgically absent.   Anus and perineum are without rashes or lesions.    Skin: No rashes, bruises or suspicious lesions. Lymph: No cervical or inguinal adenopathy. Neurologic: Grossly intact, no focal deficits, moving all 4 extremities. Psychiatric: Normal mood and affect.  Laboratory Data: Lab Results  Component Value Date   WBC 6.9 03/10/2016   HGB 12.2 03/10/2016   HCT 38.1 03/10/2016   MCV 82.8 03/10/2016   PLT 285.0 03/10/2016    Lab Results  Component Value Date   CREATININE 0.91 03/10/2016    Lab Results  Component Value Date   HGBA1C 7.4 (H) 03/10/2016    Lab Results  Component Value Date   TSH 1.40 03/10/2016       Component Value Date/Time   CHOL 160 03/28/2015 0415   CHOL 137 02/03/2013 0441   HDL 48 03/28/2015 0415   HDL 25 (L) 02/03/2013 0441   CHOLHDL 3.3 03/28/2015 0415   VLDL 21 03/28/2015 0415   VLDL 39 02/03/2013 0441   LDLCALC 91 03/28/2015 0415   LDLCALC 73 02/03/2013 0441    Lab Results  Component Value Date   AST 45 (H)  03/10/2016   Lab Results  Component Value Date   ALT 38 (H) 03/10/2016    Pertinent Imaging: Results for DENINA, RIEGER (MRN [id number]) as of 04/16/2016 11:23  Ref. Range 04/16/2016 11:16  Scan Result Unknown 0     Assessment & Plan:    1. Mixed urinary incontinence   - discussed behavioral therapies, discussed bladder training, discussed bladder control strategies, discussed pelvic floor muscle training - patient deferred  - fluid management - patient has been restricting her fluid due to her incontinence- no relief in incontinence   - offered medical therapy with beta-3 adrenergic receptor agonist and the potential side effects- samples of Myrbetriq 25 mg daily given- was not effective  - not a candidate for anticholinergic therapy due to her age  - BLADDER SCAN AMB NON-IMAGING  - discussed PTNS, PNE and/or Botox therapies- she is interested in starting PTNS therapy  2. Vaginal atrophy  - F[place] Reek was found to be cost prohibitive  - inserting DHEA 50 mg tablet into vagina nightly  3. Labial abscess  - resolved  Return for PTNS.  These notes generated with voice recognition software. I apologize for typographical errors.  Zara Council, [facility] [address] [place], [place] [address] [phone]

## 2016-04-27 DIAGNOSIS — J9611 Chronic respiratory failure with hypoxia: Secondary | ICD-10-CM | POA: Diagnosis not present

## 2016-04-28 ENCOUNTER — Other Ambulatory Visit: Payer: Self-pay | Admitting: *Deleted

## 2016-04-28 NOTE — Patient Outreach (Signed)
[facility]) Care Management  04/28/2016  Debra Saunders 1945/06/27 [id number]   RN Health Coach attempted #1  Follow up outreach call to patient.  Patient was unavailable. HIPPA compliance voicemail message was left with return callback number.   Plan: RN will call patient again within 14 days.    [facility] [phone]

## 2016-05-01 ENCOUNTER — Encounter: Payer: Commercial Managed Care - HMO | Admitting: Internal Medicine

## 2016-05-06 ENCOUNTER — Other Ambulatory Visit: Payer: Self-pay | Admitting: *Deleted

## 2016-05-07 ENCOUNTER — Encounter: Payer: Self-pay | Admitting: *Deleted

## 2016-05-07 NOTE — Patient Outreach (Signed)
[facility]) Care Management 05/06/2016  Debra Saunders [birth year] [id number]  Subjective: RN Health Coach telephone call to patient.  Hipaa compliance verified. Per patient she stil;l has some swelling in her lower extremities but no more than usual. Patient stated her weight is 233 pounds today. Per patient she has lost some weight and gained it back. Patient is not taking her diuretics as per ordered by physician. Per patient she stopped taking it because she was going to the bathroom so much. Per patient she has not been sticking to her diet. She has been eating some pie. Her fasting blood sugar was 233. Patient states her appetite is very good. Patient stated that she has been trying to do some portion control.   Objective:   Current Medications:  Current Outpatient Prescriptions  Medication Sig Dispense Refill  . acetaminophen (TYLENOL) 325 MG tablet Take 325-650 mg by mouth every 6 (six) hours as needed for mild pain. Reported on 10/10/2015    . albuterol (PROVENTIL) (2.5 MG/3ML) 0.083% nebulizer solution Take 3 mLs (2.5 mg total) by nebulization every 6 (six) hours as needed for wheezing or shortness of breath. 75 mL 12  . Blood Glucose Monitoring Suppl (TRUE METRIX AIR GLUCOSE METER) w/Device KIT USE AS DIRECTED 1 kit 0  . cyclobenzaprine (FLEXERIL) 5 MG tablet Take 1 tablet (5 mg total) by mouth 3 (three) times daily as needed for muscle spasms. 90 tablet 2  . EASY TOUCH INSULIN SYRINGE 29G X 1/2" 1 ML MISC USE AS DIRECTED WITH  LEVEMIR  INSULIN 100 each 0  . gabapentin (NEURONTIN) 100 MG capsule Take 200 mg by mouth 3 (three) times daily.    [name] glipiZIDE (GLUCOTROL) 5 MG tablet Take 0.5 tablets (2.5 mg total) by mouth daily before breakfast. 60 tablet 3  . glucose blood test strip Use as instructed to check blood glucose three times a day. e11.8 100 each 12  . insulin detemir (LEVEMIR) 100 UNIT/ML injection Inject 0.18 mLs (18 Units total) into the skin at bedtime.  (Patient taking differently: Inject 22 Units into the skin at bedtime. ) 10 mL 5  . KLOR-CON M20 20 MEQ tablet TAKE ONE TABLET BY MOUTH TWICE DAILY 60 tablet 2  . Lancets MISC Use as directed to check blood sugar three times daily e11.8 100 each 11  . losartan (COZAAR) 50 MG tablet Take 1 tablet (50 mg total) by mouth daily. 30 tablet 11  . meclizine (ANTIVERT) 25 MG tablet Take 25 mg by mouth daily as needed for dizziness. Reported on 10/10/2015    . Menthol, Topical Analgesic, (BIOFREEZE EX) Apply 1 application topically as needed (for pain).     . naproxen (EC NAPROSYN) 500 MG EC tablet Take 500 mg by mouth 2 (two) times daily as needed (for pain). Reported on 01/04/2016    . ondansetron (ZOFRAN) 8 MG tablet Take 1 tablet (8 mg total) by mouth every 8 (eight) hours as needed for nausea or vomiting. 30 tablet 2  . oxyCODONE-acetaminophen (PERCOCET/ROXICET) 5-325 MG tablet Take 1 tablet by mouth every 8 (eight) hours as needed for severe pain. 60 tablet 0  . OXYGEN Inhale into the lungs. 3 liters    . polyethylene glycol (MIRALAX / GLYCOLAX) packet Take 17 g by mouth daily as needed for moderate constipation.    . Prasterone (INTRAROSA) 6.5 MG INST Place 1 suppository vaginally daily. 30 each 12  . promethazine (PHENERGAN) 25 MG tablet Take 25 mg by mouth every 4 (  four) hours as needed for nausea or vomiting. Reported on 10/10/2015    . torsemide (DEMADEX) 20 MG tablet Take 1 tablet (20 mg total) by mouth 2 (two) times daily. 60 tablet 6  . verapamil (VERELAN PM) 240 MG 24 hr capsule TAKE ONE CAPSULE BY MOUTH ONCE DAILY 90 capsule 0   No current facility-administered medications for this visit.     Functional Status:  In your present state of health, do you have any difficulty performing the following activities: 05/06/2016 01/31/2016  Hearing? N N  Vision? N N  Difficulty concentrating or making decisions? N N  Walking or climbing stairs? Y Y  Dressing or bathing? Y Y  Doing errands,  shopping? [name]  Preparing Food and eating ? Y Y  Using the Toilet? - Y  In the past six months, have you accidently leaked urine? Y Y  Do you have problems with loss of bowel control? N N  Managing your Medications? N N  Managing your Finances? N N  Housekeeping or managing your Housekeeping? [name]  Some recent data might be hidden    Fall/Depression Screening: PHQ 2/9 Scores 05/06/2016 01/31/2016 01/07/2016 01/04/2016 12/05/2015 11/21/2015 10/29/2015  PHQ - 2 Score 0 0 0 0 0 0 0   [facility] CM Care Plan Problem One   Flowsheet Row Most Recent Value  Care Plan Problem One  Knowledge deficit in self management of congestive heart failure  Role Documenting the Problem One  [place] for Problem One  Active  [facility] Long Term Goal (31-90 days)  Patient will continue to not have any admissions for CHF within the next 90 days  [facility] Long Term Goal Met Date  05/07/16  Interventions for Problem One [place] reminded patient to keep appointments with primary care physician. RN Health Coach will monitor patient monthly telephonic. RN reminded patient the importance of taking medications as per physician ordered  [facility] CM Short Term Goal #2 (0-30 days)  Patient will  report medication adherence within the next 30 days  [facility] CM Short Term Goal #2 Start Date  05/06/16  Interventions for Short Term Goal #2  Rn discussed with patient the importance of taking medication as per Dr ordered. RN will follow up with patient of adherence of medication  [facility] CM Short Term Goal #3 (0-30 days)  Patient will verbalize eating heart health food and snacks within 30 days  [facility] CM Short Term Goal #4 (0-30 days)  Patient will report trying to get into a routine exercise within the next 30 days  [facility] CM Short Term Goal #4 Start Date  05/06/16  Interventions for Short Term Goal #4  RN discussed with patient the importance of exercising since she is wanting to loose weight.RN sent patient a list of chair  exercises. RN will follow up with patient      Assessment:  Patient has not started an exercise routine Patient is not taking her diuretics as prescribed Patient is not adhering to her diet Patient is not able to progress through the care plan   Plan:  RN will send patient educational information on portion control and plate servings RN will close case due to the consumer is not able to progress through the care plan.   [place] Management [phone]

## 2016-05-08 ENCOUNTER — Other Ambulatory Visit: Payer: Self-pay | Admitting: Student

## 2016-05-08 DIAGNOSIS — R748 Abnormal levels of other serum enzymes: Secondary | ICD-10-CM

## 2016-05-08 DIAGNOSIS — R1011 Right upper quadrant pain: Secondary | ICD-10-CM | POA: Diagnosis not present

## 2016-05-08 DIAGNOSIS — R1310 Dysphagia, unspecified: Secondary | ICD-10-CM

## 2016-05-08 DIAGNOSIS — K760 Fatty (change of) liver, not elsewhere classified: Secondary | ICD-10-CM

## 2016-05-08 DIAGNOSIS — K921 Melena: Secondary | ICD-10-CM | POA: Diagnosis not present

## 2016-05-14 ENCOUNTER — Telehealth: Payer: Self-pay | Admitting: *Deleted

## 2016-05-14 NOTE — Telephone Encounter (Signed)
LMOM for patient to call back, have authorization for her PTNS need to set up the appointments. Would like for patient to come in on Wednesdays at 1:30.

## 2016-05-16 DIAGNOSIS — I509 Heart failure, unspecified: Secondary | ICD-10-CM | POA: Diagnosis not present

## 2016-05-19 ENCOUNTER — Other Ambulatory Visit: Payer: Commercial Managed Care - HMO

## 2016-05-19 NOTE — Telephone Encounter (Signed)
Spoke with patient and she states she will not be getting the procedure. Even though it is a covered procedure patient states she can not afford to pay a 50.00 copay each week and because she has travel issues. I let patient know that if she changes her mind please give the office a call back.

## 2016-05-22 ENCOUNTER — Other Ambulatory Visit: Payer: Self-pay | Admitting: Surgical

## 2016-05-22 ENCOUNTER — Encounter: Payer: Self-pay | Admitting: Family Medicine

## 2016-05-22 MED ORDER — GLIPIZIDE 5 MG PO TABS: 2.5000 mg | ORAL_TABLET | Freq: Every day | ORAL | 3 refills | Status: DC

## 2016-05-22 MED ORDER — VERAPAMIL HCL ER 240 MG PO CP24: 240.0000 mg | ORAL_CAPSULE | Freq: Every day | ORAL | 0 refills | Status: DC

## 2016-05-23 ENCOUNTER — Ambulatory Visit
Admission: RE | Admit: 2016-05-23 | Discharge: 2016-05-23 | Disposition: A | Discharge status: Home / Self Care | Payer: Commercial Managed Care - HMO | Source: Ambulatory Visit | Attending: Student | Admitting: Student

## 2016-05-23 DIAGNOSIS — K228 Other specified diseases of esophagus: Secondary | ICD-10-CM | POA: Diagnosis not present

## 2016-05-23 DIAGNOSIS — R1310 Dysphagia, unspecified: Secondary | ICD-10-CM

## 2016-05-23 DIAGNOSIS — R748 Abnormal levels of other serum enzymes: Secondary | ICD-10-CM

## 2016-05-23 DIAGNOSIS — K760 Fatty (change of) liver, not elsewhere classified: Secondary | ICD-10-CM

## 2016-05-23 DIAGNOSIS — K449 Diaphragmatic hernia without obstruction or gangrene: Secondary | ICD-10-CM | POA: Diagnosis not present

## 2016-05-23 DIAGNOSIS — R1011 Right upper quadrant pain: Secondary | ICD-10-CM

## 2016-05-23 DIAGNOSIS — Z9049 Acquired absence of other specified parts of digestive tract: Secondary | ICD-10-CM | POA: Diagnosis not present

## 2016-05-23 DIAGNOSIS — R945 Abnormal results of liver function studies: Secondary | ICD-10-CM | POA: Diagnosis not present

## 2016-05-27 ENCOUNTER — Ambulatory Visit (INDEPENDENT_AMBULATORY_CARE_PROVIDER_SITE_OTHER): Payer: Commercial Managed Care - HMO | Admitting: Internal Medicine

## 2016-05-27 ENCOUNTER — Encounter: Payer: Self-pay | Admitting: Internal Medicine

## 2016-05-27 VITALS — BP 164/89 | HR 62 | Ht 60.0 in | Wt 233.8 lb

## 2016-05-27 DIAGNOSIS — I10 Essential (primary) hypertension: Secondary | ICD-10-CM | POA: Diagnosis not present

## 2016-05-27 DIAGNOSIS — Z950 Presence of cardiac pacemaker: Secondary | ICD-10-CM

## 2016-05-27 DIAGNOSIS — I495 Sick sinus syndrome: Secondary | ICD-10-CM | POA: Diagnosis not present

## 2016-05-27 DIAGNOSIS — K5903 Drug induced constipation: Secondary | ICD-10-CM

## 2016-05-27 DIAGNOSIS — J9611 Chronic respiratory failure with hypoxia: Secondary | ICD-10-CM | POA: Diagnosis not present

## 2016-05-27 MED ORDER — DILTIAZEM HCL ER COATED BEADS 240 MG PO CP24: 240.0000 mg | ORAL_CAPSULE | Freq: Every day | ORAL | 11 refills | Status: DC

## 2016-05-27 NOTE — Progress Notes (Signed)
Patient Care Team: [name], MD as PCP - General (Family Medicine) Thom Chimes, MD as Referring Physician (Allergy) Juanito Doom, MD as Referring Physician (Internal Medicine)   HPI  Debra Saunders is a 70 y.o. 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   She says she is much better with less dyspnea.  She is tolerating the verapamil apart from constipation.  She does not recall having been on diltiazem in the past. .  Intercurrent records from the  office visits.      Past Medical History:  Diagnosis Date  . Acute renal failure ([place])   . Arthritis   . CHF (congestive heart failure) ([place]) 2005   Dx at [facility]  . Chronic back pain   . Chronic respiratory failure (HCC)    a. on home O2  . COPD (chronic obstructive pulmonary disease) ([place])    2L [place]  . Diabetes mellitus ([place])    type II  . Gastritis   . Hyperlipidemia   . Hypertension   . Lipoma of back   . Obesity   . Sepsis with metabolic encephalopathy ([place])   . Sleep apnea   . Sleep apnea   . Streptococcal bacteremia   . Stroke ([place])   . Symptomatic bradycardia   . Thyroid disorder   . Thyroid nodule   . Urinary incontinence     Past Surgical History:  Procedure Laterality Date  . ABDOMINAL HYSTERECTOMY  1969  . CARDIAC CATHETERIZATION  2013   @ [facility]: No obstructive CAD: Only 20% ostial left circumflex.  . CHOLECYSTECTOMY    . CYST REMOVAL TRUNK  2002   BACK  . INSERT / REPLACE / REMOVE PACEMAKER    . lipoma removal  2014   back  . PACEMAKER INSERTION  [address] Scientific dual chamber pacemaker implanted by Dr Caryl Comes for symptomatic bradycardia  . PERMANENT PACEMAKER INSERTION N/A 01/09/2014   Procedure: PERMANENT PACEMAKER INSERTION;  Surgeon: Deboraha Sprang, MD;  Location: [facility] CATH LAB;  Service: Cardiovascular;  Laterality: N/A;  . TRIGGER FINGER RELEASE    . VAGINAL HYSTERECTOMY      Current  Outpatient Prescriptions  Medication Sig Dispense Refill  . acetaminophen (TYLENOL) 325 MG tablet Take 325-650 mg by mouth every 6 (six) hours as needed for mild pain. Reported on 10/10/2015    . albuterol (PROVENTIL) (2.5 MG/3ML) 0.083% nebulizer solution Take 3 mLs (2.5 mg total) by nebulization every 6 (six) hours as needed for wheezing or shortness of breath. 75 mL 12  . Blood Glucose Monitoring Suppl (TRUE METRIX AIR GLUCOSE METER) w/Device KIT USE AS DIRECTED 1 kit 0  . cyclobenzaprine (FLEXERIL) 5 MG tablet Take 1 tablet (5 mg total) by mouth 3 (three) times daily as needed for muscle spasms. 90 tablet 2  . EASY TOUCH INSULIN SYRINGE 29G X 1/2" 1 ML MISC USE AS DIRECTED WITH  LEVEMIR  INSULIN 100 each 0  . gabapentin (NEURONTIN) 100 MG capsule Take 200 mg by mouth 3 (three) times daily.    [name] glipiZIDE (GLUCOTROL) 5 MG tablet Take 0.5 tablets (2.5 mg total) by mouth daily before breakfast. 60 tablet 3  . glucose blood test strip Use as instructed to check blood glucose three times a day. e11.8 100 each 12  . insulin detemir (LEVEMIR) 100 UNIT/ML injection Inject 0.18 mLs (18 Units total) into the skin at bedtime. (Patient taking differently:  Inject 22 Units into the skin at bedtime. ) 10 mL 5  . KLOR-CON M20 20 MEQ tablet TAKE ONE TABLET BY MOUTH TWICE DAILY 60 tablet 2  . Lancets MISC Use as directed to check blood sugar three times daily e11.8 100 each 11  . losartan (COZAAR) 50 MG tablet Take 1 tablet (50 mg total) by mouth daily. 30 tablet 11  . meclizine (ANTIVERT) 25 MG tablet Take 25 mg by mouth daily as needed for dizziness. Reported on 10/10/2015    . Menthol, Topical Analgesic, (BIOFREEZE EX) Apply 1 application topically as needed (for pain).     . naproxen (EC NAPROSYN) 500 MG EC tablet Take 500 mg by mouth 2 (two) times daily as needed (for pain). Reported on 01/04/2016    . ondansetron (ZOFRAN) 8 MG tablet Take 1 tablet (8 mg total) by mouth every 8 (eight) hours as needed for  nausea or vomiting. 30 tablet 2  . oxyCODONE-acetaminophen (PERCOCET/ROXICET) 5-325 MG tablet Take 1 tablet by mouth every 8 (eight) hours as needed for severe pain. 60 tablet 0  . OXYGEN Inhale into the lungs. 3 liters    . polyethylene glycol (MIRALAX / GLYCOLAX) packet Take 17 g by mouth daily as needed for moderate constipation.    . Prasterone (INTRAROSA) 6.5 MG INST Place 1 suppository vaginally daily. 30 each 12  . promethazine (PHENERGAN) 25 MG tablet Take 25 mg by mouth every 4 (four) hours as needed for nausea or vomiting. Reported on 10/10/2015    . torsemide (DEMADEX) 20 MG tablet Take 1 tablet (20 mg total) by mouth 2 (two) times daily. 60 tablet 6  . verapamil (VERELAN PM) 240 MG 24 hr capsule Take 1 capsule (240 mg total) by mouth daily. 90 capsule 0   No current facility-administered medications for this visit.     Allergies  Allergen Reactions  . Penicillins Hives, Shortness Of Breath, Swelling and Other (See Comments)    Has patient had a PCN reaction causing immediate rash, facial/tongue/throat swelling, SOB or lightheadedness with hypotension: Yes Has patient had a PCN reaction causing severe rash involving mucus membranes or skin necrosis: No Has patient had a PCN reaction that required hospitalization No Has patient had a PCN reaction occurring within the last 10 years: No If all of the above answers are "NO", then may proceed with Cephalosporin use.  . Aspirin Hives and Other (See Comments)    Pt states that she is unable to take because she had bleeding in her brain.    . Other Other (See Comments)    Pt states that she is unable to take blood thinners because she had bleeding in her brain.     Review of Systems negative except from HPI and PMH  Physical Exam BP (!) 164/89 (BP Location: Left Arm, Patient Position: Sitting, Cuff Size: Normal)   Pulse 62   Ht 5' (1.524 m)   Wt 233 lb 12 oz (106 kg)   BMI 45.65 kg/m  Well developed and well nourished in no acute  distress wearing oxygen HENT normal E scleral and icterus clear Neck Supple Device pocket well healed; without hematoma or erythema.  There is no tethering Clear to ausculation Regular  rate and rhythm, 2/6 systolic murmur Soft with active bowel sounds No clubbing cyanosis tr  Edema Alert and oriented, grossly normal motor and sensory function Skin Warm and Dry  ECG demonstrated sinus rhythm without ventricular pacing   Assessment and  Plan  Chronotropic incompetence  Sinus bradycardia  Atrial tachycardia non sustained with rates 200 msec (ie nonsustained AFib)  Dyspnea on exertion  Secondary pulmonary hypertension  Pacemaker [employer]  The patient's device was interrogated.  The information was reviewed. No changes were made in the programming.     HFpEF  Hypertension  Hx of intracranial hemorrhage  Not on anticoagulation   Her blood pressure remains elevated. We will switch her from verapamil--diltiazem with the hopes of improving her constipation and  then uptitrate her diltiazem as tolerated.  Some intercurrent atrial arrhythmia with  degree of rapid ventricular conduction his the above.

## 2016-05-27 NOTE — Patient Instructions (Addendum)
Medication Instructions: - Your physician has recommended you make the following change in your medication:  1) Stop verapamil 2) Start diltiazem 240 mg one capsule by mouth once daily  Labwork: - none ordered  Procedures/Testing: - none ordered  Follow-Up: - Remote monitoring is used to monitor your Pacemaker of ICD from home. This monitoring reduces the number of office visits required to check your device to one time per year. It allows [place] to keep an eye on the functioning of your device to ensure it is working properly. You are scheduled for a device check from home on 08/26/16. You may send your transmission at any time that day. If you have a wireless device, the transmission will be sent automatically. After your physician reviews your transmission, you will receive a postcard with your next transmission date.  - Your physician wants you to follow-up in: 6 months with Dr. Caryl Comes. You will receive a reminder letter in the mail two months in advance. If you don't receive a letter, please call our office to schedule the follow-up appointment.   Any Additional Special Instructions Will Be Listed Below (If Applicable).     If you need a refill on your cardiac medications before your next appointment, please call your pharmacy.

## 2016-06-09 ENCOUNTER — Ambulatory Visit (INDEPENDENT_AMBULATORY_CARE_PROVIDER_SITE_OTHER): Payer: Commercial Managed Care - HMO | Admitting: Family Medicine

## 2016-06-09 ENCOUNTER — Encounter: Payer: Self-pay | Admitting: Family Medicine

## 2016-06-09 VITALS — BP 130/70 | HR 64 | Temp 98.8°F | Wt 234.0 lb

## 2016-06-09 DIAGNOSIS — R0789 Other chest pain: Secondary | ICD-10-CM | POA: Diagnosis not present

## 2016-06-09 DIAGNOSIS — E118 Type 2 diabetes mellitus with unspecified complications: Secondary | ICD-10-CM | POA: Diagnosis not present

## 2016-06-09 DIAGNOSIS — Z794 Long term (current) use of insulin: Secondary | ICD-10-CM | POA: Diagnosis not present

## 2016-06-09 DIAGNOSIS — J9611 Chronic respiratory failure with hypoxia: Secondary | ICD-10-CM

## 2016-06-09 DIAGNOSIS — I82502 Chronic embolism and thrombosis of unspecified deep veins of left lower extremity: Secondary | ICD-10-CM

## 2016-06-09 DIAGNOSIS — R0989 Other specified symptoms and signs involving the circulatory and respiratory systems: Secondary | ICD-10-CM

## 2016-06-09 DIAGNOSIS — I10 Essential (primary) hypertension: Secondary | ICD-10-CM

## 2016-06-09 DIAGNOSIS — R079 Chest pain, unspecified: Secondary | ICD-10-CM

## 2016-06-09 DIAGNOSIS — G629 Polyneuropathy, unspecified: Secondary | ICD-10-CM | POA: Insufficient documentation

## 2016-06-09 LAB — COMPREHENSIVE METABOLIC PANEL
ALT: 43 U/L — ABNORMAL HIGH (ref 0–35)
AST: 71 U/L — ABNORMAL HIGH (ref 0–37)
Albumin: 3.7 g/dL (ref 3.5–5.2)
Alkaline Phosphatase: 131 U/L — ABNORMAL HIGH (ref 39–117)
BUN: 15 mg/dL (ref 6–23)
CO2: 28 mEq/L (ref 19–32)
Calcium: 9.2 mg/dL (ref 8.4–10.5)
Chloride: 95 mEq/L — ABNORMAL LOW (ref 96–112)
Creatinine, Ser: 0.75 mg/dL (ref 0.40–1.20)
GFR: 97.43 mL/min (ref >60.00)
Glucose, Bld: 367 mg/dL — ABNORMAL HIGH (ref 70–99)
Potassium: 4.3 mEq/L (ref 3.5–5.1)
Sodium: 133 mEq/L — ABNORMAL LOW (ref 135–145)
Total Bilirubin: 0.5 mg/dL (ref 0.2–1.2)
Total Protein: 7.7 g/dL (ref 6.0–8.3)

## 2016-06-09 LAB — HEMOGLOBIN A1C: Hgb A1c MFr Bld: 8.8 % — ABNORMAL HIGH (ref 4.6–6.5)

## 2016-06-09 MED ORDER — GLUCOSE BLOOD VI STRP: ORAL_STRIP | 12 refills | Status: DC

## 2016-06-09 NOTE — Assessment & Plan Note (Signed)
Blood pressure improved with recent changes through cardiology. She'll continue current medications. CMP today.

## 2016-06-09 NOTE — Assessment & Plan Note (Signed)
Swelling appears to be stable left lower extremity. She will continue to monitor this. If worsens or has any changes should be evaluated.

## 2016-06-09 NOTE — Progress Notes (Signed)
[name], MD Phone: [phone]  Debra Saunders is a 70 y.o. female who presents today for follow-up.  DIABETES Disease Monitoring: Blood Sugar ranges-260's Polyuria/phagia/dipsia- polyuria and polydipsia      ophthalmology- due later this month. Medications: Compliance- taking Levemir 22 units, glipizide as well Hypoglycemic symptoms- no Patient does note neuropathy in her hands and feet with pins and needles. No numbness. Taking gabapentin which is of good benefit.  HYPERTENSION  Disease Monitoring  Home BP Monitoring not checking  Chest pain- yes, see below    Dyspnea- chronic stable chronic respiratory failure Medications  Compliance-  taking diltiazem and losartan  Edema- chronic stable left lower extremity swelling related to chronic DVT  Patient has a history of a left lower extremity chronic DVT. She has chronic stable swelling. Has an IVC filter in place. Cannot be on anticoagulation given history of subarachnoid hemorrhage when she was on anticoagulation. No changes to her left lower extremity since her last visit.  Chronic respiratory failure: Patient is on 3 L of oxygen. Notes her breathing is stable and unchanged. She was on lateris though has been off of this as she could not afford it. Breathing has been stable since coming off of this. Has follow-up with pulmonology later this month.  Patient noted chest pain centrally for about a week resolving 2 days ago. Would happen anytime she would move her arms or torso. She notes she was diaphoretic with it at times. No increased shortness of breath. No radiation. She has a history of chronic musculoskeletal chest pain. Has not had to increase her oxygen. No increased swelling in her leg. IVC filter in place.   PMH: Former smoker   ROS see history of present illness  Objective  Physical Exam Vitals:   06/09/16 1358  BP: 130/70  Pulse: 64  Temp: 98.8 F (37.1 C)    BP Readings from Last 3 Encounters:    06/09/16 130/70  05/27/16 (!) 164/89  04/16/16 (!) 174/81   Wt Readings from Last 3 Encounters:  06/09/16 234 lb (106.1 kg)  05/27/16 233 lb 12 oz (106 kg)  04/16/16 232 lb (105.2 kg)    Physical Exam  Constitutional: No distress.  HENT:  Head: Normocephalic and atraumatic.  Cardiovascular: Normal rate and regular rhythm.   Pulmonary/Chest: Effort normal and breath sounds normal.  Musculoskeletal:  Left leg swelling noted with no tense edema or redness or tenderness, difficult to palpate DP pulses and PT pulses on the left given swelling  Neurological: She is alert.  Skin: She is not diaphoretic.   Diabetic Foot Exam - Simple   Simple Foot Form Diabetic Foot exam was performed with the following findings:  Yes 06/09/2016  2:39 PM  Visual Inspection See comments:  Yes Sensation Testing Intact to touch and monofilament testing bilaterally:  Yes Pulse Check See comments:  Yes Comments Swelling in the left foot, no other deformities, no ulcerations, and no other skin breakdown bilaterally, difficult to palpate PT and DP pulses on the left given swelling, intact DP and PT pulses on the right, bilateral feet are warm     EKG: Normal sinus rhythm, rate 70, occasional PAC, no ST or T-wave changes, similar to previous EKG  Assessment/Plan: Please see individual problem list.  Chronic respiratory failure with hypoxia (HCC) Symptoms stable. Continue oxygen. Follow-up with pulmonology later this month.  Hypertension Blood pressure improved with recent changes through cardiology. She'll continue current medications. CMP today.  Chronic deep vein thrombosis (DVT) (HCC)  Swelling appears to be stable left lower extremity. She will continue to monitor this. If worsens or has any changes should be evaluated.  Diabetes mellitus type 2, controlled ([place]) Check A1c today. Continue current medications. Foot exam completed today. Swelling in her left foot makes it difficult to palpate PT  and DP pulses. Referral to vascular surgery will be placed for evaluation of this.  Pain in the chest Patient's most recent chest pain likely represents her chronic atypical musculoskeletal chest pain. EKG unchanged and reassuring. Benign lung exam. Vital signs are stable. Doubt VTE given stable vital signs and IVC filter. She'll continue to monitor. Discussed that if this recurs or she develops any new symptoms or changes in symptoms she needs to be evaluated immediately. She is given return precautions.  Neuropathy ([place]) Likely related to diabetes. Intact monofilament and light touch sensation. She'll continue gabapentin. Continue to monitor.   Orders Placed This Encounter  Procedures  . HgB A1c  . Comp Met (CMET)  . Ambulatory referral to Vascular Surgery    Referral Priority:   Routine    Referral Type:   Surgical    Referral Reason:   Specialty Services Required    Requested Specialty:   Vascular Surgery    Number of Visits Requested:   1  . EKG 12-Lead    Meds ordered this encounter  Medications  . glucose blood test strip    Sig: Use as instructed to check blood glucose three times a day. e11.8    Dispense:  100 each    Refill:  [place], MD [place]

## 2016-06-09 NOTE — Assessment & Plan Note (Signed)
Likely related to diabetes. Intact monofilament and light touch sensation. She'll continue gabapentin. Continue to monitor.

## 2016-06-09 NOTE — Progress Notes (Signed)
Pre visit review using our clinic review tool, if applicable. No additional management support is needed unless otherwise documented below in the visit note. 

## 2016-06-09 NOTE — Assessment & Plan Note (Addendum)
Check A1c today. Continue current medications. Foot exam completed today. Swelling in her left foot makes it difficult to palpate PT and DP pulses. Referral to vascular surgery will be placed for evaluation of this.

## 2016-06-09 NOTE — Patient Instructions (Signed)
Nice to see you. We'll check lab work today and call you with the results. Please continue on your oxygen and follow-up with pulmonology. Please continue your blood pressure medications. If you develop chest pain, shortness of breath, worsening swelling in her legs, or any new or change in symptoms please seek medical attention medially.

## 2016-06-09 NOTE — Assessment & Plan Note (Signed)
Symptoms stable. Continue oxygen. Follow-up with pulmonology later this month.

## 2016-06-09 NOTE — Assessment & Plan Note (Addendum)
Patient's most recent chest pain likely represents her chronic atypical musculoskeletal chest pain. EKG unchanged and reassuring. Benign lung exam. Vital signs are stable. Doubt VTE given stable vital signs and IVC filter. She'll continue to monitor. Discussed that if this recurs or she develops any new symptoms or changes in symptoms she needs to be evaluated immediately. She is given return precautions.

## 2016-06-15 DIAGNOSIS — I509 Heart failure, unspecified: Secondary | ICD-10-CM | POA: Diagnosis not present

## 2016-06-19 ENCOUNTER — Encounter: Payer: Self-pay | Admitting: Family Medicine

## 2016-06-20 ENCOUNTER — Other Ambulatory Visit: Payer: Self-pay | Admitting: Family Medicine

## 2016-06-24 NOTE — Telephone Encounter (Signed)
Per request I am sending this back to you

## 2016-06-25 ENCOUNTER — Other Ambulatory Visit: Payer: Self-pay | Admitting: Internal Medicine

## 2016-06-27 ENCOUNTER — Other Ambulatory Visit: Payer: Self-pay | Admitting: Family Medicine

## 2016-06-27 DIAGNOSIS — J9611 Chronic respiratory failure with hypoxia: Secondary | ICD-10-CM | POA: Diagnosis not present

## 2016-06-27 MED ORDER — METFORMIN HCL 500 MG PO TABS: 500.0000 mg | ORAL_TABLET | Freq: Every day | ORAL | 3 refills | Status: DC

## 2016-07-07 ENCOUNTER — Other Ambulatory Visit: Payer: Self-pay | Admitting: Family Medicine

## 2016-07-07 NOTE — Telephone Encounter (Signed)
Last filled 04/07/16 60 2rfs patient would like 90 days is this ok? OV scheduled 07/23/16

## 2016-07-07 NOTE — Telephone Encounter (Signed)
Please confirm that patient takes this with her torsemide. Thanks.

## 2016-07-08 NOTE — Telephone Encounter (Signed)
Patient states she is taking this with the torsemide

## 2016-07-09 NOTE — Telephone Encounter (Signed)
Sent to pharmacy 

## 2016-07-16 ENCOUNTER — Encounter: Payer: Self-pay | Admitting: Family Medicine

## 2016-07-16 DIAGNOSIS — I509 Heart failure, unspecified: Secondary | ICD-10-CM | POA: Diagnosis not present

## 2016-07-18 ENCOUNTER — Other Ambulatory Visit: Payer: Self-pay | Admitting: Family Medicine

## 2016-07-18 DIAGNOSIS — K921 Melena: Secondary | ICD-10-CM | POA: Diagnosis not present

## 2016-07-18 MED ORDER — BASAGLAR KWIKPEN 100 UNIT/ML ~~LOC~~ SOPN: 22.0000 [IU] | PEN_INJECTOR | Freq: Every day | SUBCUTANEOUS | 1 refills | Status: DC

## 2016-07-22 ENCOUNTER — Ambulatory Visit (INDEPENDENT_AMBULATORY_CARE_PROVIDER_SITE_OTHER): Payer: Medicare HMO | Admitting: Vascular Surgery

## 2016-07-22 ENCOUNTER — Encounter (INDEPENDENT_AMBULATORY_CARE_PROVIDER_SITE_OTHER): Payer: Self-pay | Admitting: Vascular Surgery

## 2016-07-22 VITALS — BP 176/88 | HR 74 | Resp 16 | Ht 61.0 in | Wt 233.0 lb

## 2016-07-22 DIAGNOSIS — E118 Type 2 diabetes mellitus with unspecified complications: Secondary | ICD-10-CM

## 2016-07-22 DIAGNOSIS — Z794 Long term (current) use of insulin: Secondary | ICD-10-CM | POA: Diagnosis not present

## 2016-07-22 DIAGNOSIS — E785 Hyperlipidemia, unspecified: Secondary | ICD-10-CM | POA: Diagnosis not present

## 2016-07-22 DIAGNOSIS — I82502 Chronic embolism and thrombosis of unspecified deep veins of left lower extremity: Secondary | ICD-10-CM

## 2016-07-22 DIAGNOSIS — I890 Lymphedema, not elsewhere classified: Secondary | ICD-10-CM

## 2016-07-22 DIAGNOSIS — M79609 Pain in unspecified limb: Secondary | ICD-10-CM | POA: Insufficient documentation

## 2016-07-22 DIAGNOSIS — M79605 Pain in left leg: Secondary | ICD-10-CM

## 2016-07-22 DIAGNOSIS — I10 Essential (primary) hypertension: Secondary | ICD-10-CM | POA: Diagnosis not present

## 2016-07-22 NOTE — Patient Instructions (Signed)
Peripheral Vascular Disease Peripheral vascular disease (PVD) is a disease of the blood vessels that are not part of your heart and brain. A simple term for PVD is poor circulation. In most cases, PVD narrows the blood vessels that carry blood from your heart to the rest of your body. This can result in a decreased supply of blood to your arms, legs, and internal organs, like your stomach or kidneys. However, it most often affects a person's lower legs and feet. There are two types of PVD.  Organic PVD. This is the more common type. It is caused by damage to the structure of blood vessels.  Functional PVD. This is caused by conditions that make blood vessels contract and tighten (spasm).  Without treatment, PVD tends to get worse over time. PVD can also lead to acute ischemic limb. This is when an arm or limb suddenly has trouble getting enough blood. This is a medical emergency. What are the causes? Each type of PVD has many different causes. The most common cause of PVD is buildup of a fatty material (plaque) inside of your arteries (atherosclerosis). Small amounts of plaque can break off from the walls of the blood vessels and become lodged in a smaller artery. This blocks blood flow and can cause acute ischemic limb. Other common causes of PVD include:  Blood clots that form inside of blood vessels.  Injuries to blood vessels.  Diseases that cause inflammation of blood vessels or cause blood vessel spasms.  Health behaviors and health history that increase your risk of developing PVD.  What increases the risk? You may have a greater risk of PVD if you:  Have a family history of PVD.  Have certain medical conditions, including: ? High cholesterol. ? Diabetes. ? High blood pressure (hypertension). ? Coronary heart disease. ? Past problems with blood clots. ? Past injury, such as burns or a broken bone. These may have damaged blood vessels in your limbs. ? Buerger disease. This is  caused by inflamed blood vessels in your hands and feet. ? Some forms of arthritis. ? Rare birth defects that affect the arteries in your legs.  Use tobacco.  Do not get enough exercise.  Are obese.  Are age 50 or older.  What are the signs or symptoms? PVD may cause many different symptoms. Your symptoms depend on what part of your body is not getting enough blood. Some common signs and symptoms include:  Cramps in your lower legs. This may be a symptom of poor leg circulation (claudication).  Pain and weakness in your legs while you are physically active that goes away when you rest (intermittent claudication).  Leg pain when at rest.  Leg numbness, tingling, or weakness.  Coldness in a leg or foot, especially when compared with the other leg.  Skin or hair changes. These can include: ? Hair loss. ? Shiny skin. ? Pale or bluish skin. ? Thick toenails.  Inability to get or maintain an erection (erectile dysfunction).  People with PVD are more prone to developing ulcers and sores on their toes, feet, or legs. These may take longer than normal to heal. How is this diagnosed? Your health care provider may diagnose PVD from your signs and symptoms. The health care provider will also do a physical exam. You may have tests to find out what is causing your PVD and determine its severity. Tests may include:  Blood pressure recordings from your arms and legs and measurements of the strength of your pulses (  pulse volume recordings).  Imaging studies using sound waves to take pictures of the blood flow through your blood vessels (Doppler ultrasound).  Injecting a dye into your blood vessels before having imaging studies using: ? X-rays (angiogram or arteriogram). ? Computer-generated X-rays (CT angiogram). ? A powerful electromagnetic field and a computer (magnetic resonance angiogram or MRA).  How is this treated? Treatment for PVD depends on the cause of your condition and the  severity of your symptoms. It also depends on your age. Underlying causes need to be treated and controlled. These include long-lasting (chronic) conditions, such as diabetes, high cholesterol, and high blood pressure. You may need to first try making lifestyle changes and taking medicines. Surgery may be needed if these do not work. Lifestyle changes may include:  Quitting smoking.  Exercising regularly.  Following a low-fat, low-cholesterol diet.  Medicines may include:  Blood thinners to prevent blood clots.  Medicines to improve blood flow.  Medicines to improve your blood cholesterol levels.  Surgical procedures may include:  A procedure that uses an inflated balloon to open a blocked artery and improve blood flow (angioplasty).  A procedure to put in a tube (stent) to keep a blocked artery open (stent implant).  Surgery to reroute blood flow around a blocked artery (peripheral bypass surgery).  Surgery to remove dead tissue from an infected wound on the affected limb.  Amputation. This is surgical removal of the affected limb. This may be necessary in cases of acute ischemic limb that are not improved through medical or surgical treatments.  Follow these instructions at home:  Take medicines only as directed by your health care provider.  Do not use any tobacco products, including cigarettes, chewing tobacco, or electronic cigarettes. If you need help quitting, ask your health care provider.  Lose weight if you are overweight, and maintain a healthy weight as directed by your health care provider.  Eat a diet that is low in fat and cholesterol. If you need help, ask your health care provider.  Exercise regularly. Ask your health care provider to suggest some good activities for you.  Use compression stockings or other mechanical devices as directed by your health care provider.  Take good care of your feet. ? Wear comfortable shoes that fit well. ? Check your feet  often for any cuts or sores. Contact a health care provider if:  You have cramps in your legs while walking.  You have leg pain when you are at rest.  You have coldness in a leg or foot.  Your skin changes.  You have erectile dysfunction.  You have cuts or sores on your feet that are not healing. Get help right away if:  Your arm or leg turns cold and blue.  Your arms or legs become red, warm, swollen, painful, or numb.  You have chest pain or trouble breathing.  You suddenly have weakness in your face, arm, or leg.  You become very confused or lose the ability to speak.  You suddenly have a very bad headache or lose your vision. This information is not intended to replace advice given to you by your health care provider. Make sure you discuss any questions you have with your health care provider. Document Released: 07/17/2004 Document Revised: 11/15/2015 Document Reviewed: 11/17/2013 Elsevier Interactive Patient Education  2017 [employer] Inc.  

## 2016-07-22 NOTE — Assessment & Plan Note (Signed)
blood pressure control important in reducing the progression of atherosclerotic disease. On appropriate oral medications.  

## 2016-07-22 NOTE — Assessment & Plan Note (Signed)
Her leg swelling is problematic and refractory. I have recommended that she get back to using her lymphedema pump regularly. I have recommended she also get back to wearing compression stockings and elevating her legs. We will see her back following noninvasive studies.

## 2016-07-22 NOTE — Assessment & Plan Note (Signed)
With resultant postphlebitic symptoms of the left leg. Compression, elevation, and increased activity. She is not able to be on anticoagulation and at this point that would not be of any benefit anyway. She has an IVC filter in place. I am okay if she uses her lymphedema pump.

## 2016-07-22 NOTE — Assessment & Plan Note (Signed)
Although she has reason to have lower extremity pain with neuropathy and postphlebitic symptoms, with poorly palpable pedal pulses I think it is important to ensure that arterial insufficiency is not present as well. I have discussed the pathophysiology and natural history of arterial insufficiency. I discussed why this could be a limb threatening situation. We will obtain ABIs in the near future at her convenience and see her back to discuss the results.

## 2016-07-22 NOTE — Assessment & Plan Note (Signed)
lipid control important in reducing the progression of atherosclerotic disease. Continue statin therapy  

## 2016-07-22 NOTE — Progress Notes (Signed)
Patient ID: Debra Saunders, female   DOB: 16-Jul-1945, 71 y.o.   MRN: [id number]  Chief Complaint  Patient presents with  . Follow-up    HPI Debra Saunders is a 71 y.o. female.  I am asked to see the patient by Dr. [name] for evaluation of vascular disease of the lower extremities.  The patient reports Worsening swelling of her left leg. She has significant postphlebitic symptoms and swelling has been prominent for years now. She had a lymphedema pump but stopped using it some time back and has not resumed. She can no longer get her compression stockings on. Her primary care physician was concerned because her pedal pulses were not palpable on the left foot. Also, her neuropathy symptoms seem to be continuing to worsen. These are worse on the left leg and the right leg. She does not have ulceration and infection. She does have dry scaling skin. The legs are very heavy and ache much of the time. Her right leg has some neuropathy symptoms but they are not that bad. She does not have any right leg swelling. She still has an IVC filter in place, but is unable to take anticoagulation.   Past Medical History:  Diagnosis Date  . Acute renal failure ([place])   . Arthritis   . CHF (congestive heart failure) ([place]) 2005   Dx at [facility]  . Chronic back pain   . Chronic respiratory failure (HCC)    a. on home O2  . COPD (chronic obstructive pulmonary disease) ([place])    2L [place]  . Diabetes mellitus ([place])    type II  . Gastritis   . Hyperlipidemia   . Hypertension   . Lipoma of back   . Obesity   . Sepsis with metabolic encephalopathy ([place])   . Sleep apnea   . Sleep apnea   . Streptococcal bacteremia   . Stroke ([place])   . Symptomatic bradycardia   . Thyroid disorder   . Thyroid nodule   . Urinary incontinence     Past Surgical History:  Procedure Laterality Date  . ABDOMINAL HYSTERECTOMY  1969  . CARDIAC CATHETERIZATION  2013   @ [facility]: No obstructive CAD: Only 20% ostial left circumflex.   . CHOLECYSTECTOMY    . CYST REMOVAL TRUNK  2002   BACK  . INSERT / REPLACE / REMOVE PACEMAKER    . lipoma removal  2014   back  . PACEMAKER INSERTION  [address] [employer] dual chamber pacemaker implanted by Dr Caryl Comes for symptomatic bradycardia  . PERMANENT PACEMAKER INSERTION N/A 01/09/2014   Procedure: PERMANENT PACEMAKER INSERTION;  Surgeon: Deboraha Sprang, MD;  Location: [facility] CATH LAB;  Service: Cardiovascular;  Laterality: N/A;  . TRIGGER FINGER RELEASE    . VAGINAL HYSTERECTOMY      Family History  Problem Relation Age of Onset  . Heart disease Mother   . Hypertension Mother   . COPD Mother     was a smoker  . Thyroid disease Mother   . Hypertension Father   . Hypertension Sister   . Thyroid disease Sister   . Hypertension Sister   . Thyroid disease Sister   . Breast cancer Maternal Aunt   . Kidney disease Neg Hx   . Bladder Cancer Neg Hx     Social History Social History  Substance Use Topics  . Smoking status: Former Smoker    Packs/day: 0.30    Years: 2.00    Types: Cigarettes  Quit date: 06/23/1990  . Smokeless tobacco: Never Used  . Alcohol use No  No IV drug use  Allergies  Allergen Reactions  . Penicillins Hives, Shortness Of Breath, Swelling and Other (See Comments)    Has patient had a PCN reaction causing immediate rash, facial/tongue/throat swelling, SOB or lightheadedness with hypotension: Yes Has patient had a PCN reaction causing severe rash involving mucus membranes or skin necrosis: No Has patient had a PCN reaction that required hospitalization No Has patient had a PCN reaction occurring within the last 10 years: No If all of the above answers are "NO", then may proceed with Cephalosporin use.  . Aspirin Hives and Other (See Comments)    Pt states that she is unable to take because she had bleeding in her brain.    . Other Other (See Comments)    Pt states that she is unable to take blood thinners because she had bleeding in her brain.       Current Outpatient Prescriptions  Medication Sig Dispense Refill  . acetaminophen (TYLENOL) 325 MG tablet Take 325-650 mg by mouth every 6 (six) hours as needed for mild pain. Reported on 10/10/2015    . Blood Glucose Monitoring Suppl (TRUE METRIX AIR GLUCOSE METER) w/Device KIT USE AS DIRECTED 1 kit 0  . cyclobenzaprine (FLEXERIL) 5 MG tablet Take 1 tablet (5 mg total) by mouth 3 (three) times daily as needed for muscle spasms. 90 tablet 2  . diltiazem (CARDIZEM CD) 240 MG 24 hr capsule Take 1 capsule (240 mg total) by mouth daily. 30 capsule 11  . EASY TOUCH INSULIN SYRINGE 29G X 1/2" 1 ML MISC USE AS DIRECTED WITH  LEVEMIR  INSULIN 100 each 0  . gabapentin (NEURONTIN) 100 MG capsule Take 200 mg by mouth 3 (three) times daily.    [name] glipiZIDE (GLUCOTROL) 5 MG tablet Take 0.5 tablets (2.5 mg total) by mouth daily before breakfast. 60 tablet 3  . glucose blood test strip Use as instructed to check blood glucose three times a day. e11.8 100 each 12  . Insulin Glargine (BASAGLAR KWIKPEN) 100 UNIT/ML SOPN Inject 0.22 mLs (22 Units total) into the skin at bedtime. 15 mL 1  . KLOR-CON M20 20 MEQ tablet TAKE ONE TABLET BY MOUTH TWICE DAILY 60 tablet 2  . Lancets MISC Use as directed to check blood sugar three times daily e11.8 100 each 11  . losartan (COZAAR) 50 MG tablet Take 1 tablet (50 mg total) by mouth daily. 30 tablet 11  . meclizine (ANTIVERT) 25 MG tablet Take 25 mg by mouth daily as needed for dizziness. Reported on 10/10/2015    . Menthol, Topical Analgesic, (BIOFREEZE EX) Apply 1 application topically as needed (for pain).     . metFORMIN (GLUCOPHAGE) 500 MG tablet Take 1 tablet (500 mg total) by mouth daily with breakfast. 90 tablet 3  . naproxen (EC NAPROSYN) 500 MG EC tablet Take 500 mg by mouth 2 (two) times daily as needed (for pain). Reported on 01/04/2016    . ondansetron (ZOFRAN) 8 MG tablet Take 1 tablet (8 mg total) by mouth every 8 (eight) hours as needed for nausea or vomiting.  30 tablet 2  . oxyCODONE-acetaminophen (PERCOCET/ROXICET) 5-325 MG tablet Take 1 tablet by mouth every 8 (eight) hours as needed for severe pain. 60 tablet 0  . OXYGEN Inhale into the lungs. 3 liters    . polyethylene glycol (MIRALAX / GLYCOLAX) packet Take 17 g by mouth daily as needed for  moderate constipation.    . Prasterone (INTRAROSA) 6.5 MG INST Place 1 suppository vaginally daily. 30 each 12  . promethazine (PHENERGAN) 25 MG tablet Take 25 mg by mouth every 4 (four) hours as needed for nausea or vomiting. Reported on 10/10/2015    . torsemide (DEMADEX) 20 MG tablet Take 1 tablet (20 mg total) by mouth 2 (two) times daily. 60 tablet 6  . albuterol (PROVENTIL) (2.5 MG/3ML) 0.083% nebulizer solution Take 3 mLs (2.5 mg total) by nebulization every 6 (six) hours as needed for wheezing or shortness of breath. (Patient not taking: Reported on 07/22/2016) 75 mL 12   No current facility-administered medications for this visit.       REVIEW OF SYSTEMS (Negative unless checked)  Constitutional: '[]' Weight loss  '[]' Fever  '[]' Chills Cardiac: '[]' Chest pain   '[]' Chest pressure   '[]' Palpitations   '[]' Shortness of breath when laying flat   '[]' Shortness of breath at rest   '[]' Shortness of breath with exertion. Vascular:  '[]' Pain in legs with walking   '[x]' Pain in legs at rest   '[]' Pain in legs when laying flat   '[]' Claudication   '[]' Pain in feet when walking  '[x]' Pain in feet at rest  '[]' Pain in feet when laying flat   '[x]' History of DVT   '[]' Phlebitis   '[x]' Swelling in legs   '[]' Varicose veins   '[]' Non-healing ulcers Pulmonary:   '[]' Uses home oxygen   '[]' Productive cough   '[]' Hemoptysis   '[]' Wheeze  '[x]' COPD   '[]' Asthma Neurologic:  '[]' Dizziness  '[]' Blackouts   '[]' Seizures   '[x]' History of stroke   '[]' History of TIA  '[]' Aphasia   '[]' Temporary blindness   '[]' Dysphagia   '[]' Weakness or numbness in arms   '[]' Weakness or numbness in legs Musculoskeletal:  '[x]' Arthritis   '[]' Joint swelling   '[]' Joint pain   '[]' Low back pain Hematologic:  '[]' Easy  bruising  '[]' Easy bleeding   '[]' Hypercoagulable state   '[]' Anemic  '[]' Hepatitis Gastrointestinal:  '[]' Blood in stool   '[]' Vomiting blood  '[]' Gastroesophageal reflux/heartburn   '[]' Abdominal pain Genitourinary:  '[x]' Chronic kidney disease   '[]' Difficult urination  '[]' Frequent urination  '[]' Burning with urination   '[]' Hematuria Skin:  '[]' Rashes   '[]' Ulcers   '[]' Wounds Psychological:  '[]' History of anxiety   '[]'  History of major depression.    Physical Exam BP (!) 176/88   Pulse 74   Resp 16   Ht '5\' 1"'  (1.549 m)   Wt 233 lb (105.7 kg)   BMI 44.02 kg/m  Gen:  WD/WN, NAD. Obese AAF in no apparent distress Head: [place]/AT, No temporalis wasting. Prominent temp pulse not noted. Ear/Nose/Throat: Hearing grossly intact, nares w/o erythema or drainage, oropharynx w/o Erythema/Exudate Eyes: Conjunctiva clear, sclera non-icteric  Neck: trachea midline.  No JVD.  Pulmonary:  Good air movement, respirations not labored on supplemental oxygen.  Cardiac: RRR, normal S1, S2. Vascular:  Vessel Right Left  Radial Palpable Palpable  Ulnar Palpable Palpable  Brachial Palpable Palpable  Carotid Palpable, without bruit Palpable, without bruit  Aorta Not palpable N/A  Femoral Palpable Palpable  Popliteal Palpable Not Palpable  PT 1+ Palpable Not Palpable  DP 2+ Palpable Trace Palpable   Gastrointestinal: soft, non-tender/non-distended. No guarding/reflex. No masses, surgical incisions, or scars. Musculoskeletal: M/S 5/5 throughout. Walks with a walker. No deformity or atrophy. No RLE edema, 3+ LLE edema. Neurologic: Sensation grossly intact in extremities.  Symmetrical.  Speech is fluent. Motor exam as listed above. Psychiatric: Judgment intact, Mood & affect appropriate for pt's clinical situation. Dermatologic: No rashes or ulcers noted.  No  cellulitis or open wounds. Lymph : No Cervical, Axillary, or Inguinal lymphadenopathy.   Radiology No results found.  Labs Recent Results (from the past 2160 hour(s))  HgB  A1c     Status: Abnormal   Collection Time: 06/09/16  2:40 PM  Result Value Ref Range   Hgb A1c MFr Bld 8.8 (H) 4.6 - 6.5 %    Comment: Glycemic Control Guidelines for People with Diabetes:Non Diabetic:  <6%Goal of Therapy: <7%Additional Action Suggested:  >8%   Comp Met (CMET)     Status: Abnormal   Collection Time: 06/09/16  2:40 PM  Result Value Ref Range   Sodium 133 (L) 135 - 145 mEq/L   Potassium 4.3 3.5 - 5.1 mEq/L   Chloride 95 (L) 96 - 112 mEq/L   CO2 28 19 - 32 mEq/L   Glucose, Bld 367 (H) 70 - 99 mg/dL   BUN 15 6 - 23 mg/dL   Creatinine, Ser 0.75 0.40 - 1.20 mg/dL   Total Bilirubin 0.5 0.2 - 1.2 mg/dL   Alkaline Phosphatase 131 (H) 39 - 117 U/L   AST 71 (H) 0 - 37 U/L   ALT 43 (H) 0 - 35 U/L   Total Protein 7.7 6.0 - 8.3 g/dL   Albumin 3.7 3.5 - 5.2 g/dL   Calcium 9.2 8.4 - 10.5 mg/dL   GFR 97.43 >60.00 mL/min    Assessment/Plan:  Hyperlipidemia lipid control important in reducing the progression of atherosclerotic disease. Continue statin therapy   Lymphedema Her leg swelling is problematic and refractory. I have recommended that she get back to using her lymphedema pump regularly. I have recommended she also get back to wearing compression stockings and elevating her legs. We will see her back following noninvasive studies.  Diabetes mellitus type 2, controlled ([place]) blood pressure control important in reducing the progression of atherosclerotic disease. On appropriate oral medications.   Chronic deep vein thrombosis (DVT) (HCC) With resultant postphlebitic symptoms of the left leg. Compression, elevation, and increased activity. She is not able to be on anticoagulation and at this point that would not be of any benefit anyway. She has an IVC filter in place. I am okay if she uses her lymphedema pump.  Hypertension blood pressure control important in reducing the progression of atherosclerotic disease. On appropriate oral medications.   Pain in limb Although  she has reason to have lower extremity pain with neuropathy and postphlebitic symptoms, with poorly palpable pedal pulses I think it is important to ensure that arterial insufficiency is not present as well. I have discussed the pathophysiology and natural history of arterial insufficiency. I discussed why this could be a limb threatening situation. We will obtain ABIs in the near future at her convenience and see her back to discuss the results.      Leotis Pain 07/22/2016, 3:58 PM   This note was created with Dragon medical transcription system.  Any errors from dictation are unintentional.

## 2016-07-23 ENCOUNTER — Encounter: Payer: Self-pay | Admitting: Family Medicine

## 2016-07-23 ENCOUNTER — Ambulatory Visit (INDEPENDENT_AMBULATORY_CARE_PROVIDER_SITE_OTHER): Payer: Commercial Managed Care - HMO | Admitting: Family Medicine

## 2016-07-23 VITALS — BP 164/96 | HR 102 | Temp 98.2°F | Resp 16 | Ht 61.0 in | Wt 233.0 lb

## 2016-07-23 DIAGNOSIS — I10 Essential (primary) hypertension: Secondary | ICD-10-CM

## 2016-07-23 DIAGNOSIS — I499 Cardiac arrhythmia, unspecified: Secondary | ICD-10-CM | POA: Diagnosis not present

## 2016-07-23 DIAGNOSIS — R0989 Other specified symptoms and signs involving the circulatory and respiratory systems: Secondary | ICD-10-CM

## 2016-07-23 DIAGNOSIS — E119 Type 2 diabetes mellitus without complications: Secondary | ICD-10-CM

## 2016-07-23 DIAGNOSIS — E118 Type 2 diabetes mellitus with unspecified complications: Secondary | ICD-10-CM

## 2016-07-23 DIAGNOSIS — Z23 Encounter for immunization: Secondary | ICD-10-CM | POA: Diagnosis not present

## 2016-07-23 DIAGNOSIS — Z794 Long term (current) use of insulin: Secondary | ICD-10-CM

## 2016-07-23 LAB — POCT GLYCOSYLATED HEMOGLOBIN (HGB A1C): Hemoglobin A1C: 8.5

## 2016-07-23 MED ORDER — GABAPENTIN 100 MG PO CAPS: 200.0000 mg | ORAL_CAPSULE | Freq: Three times a day (TID) | ORAL | 3 refills | Status: DC

## 2016-07-23 MED ORDER — CYCLOBENZAPRINE HCL 5 MG PO TABS: 5.0000 mg | ORAL_TABLET | Freq: Three times a day (TID) | ORAL | 2 refills | Status: DC | PRN

## 2016-07-23 MED ORDER — LOSARTAN POTASSIUM 100 MG PO TABS: 100.0000 mg | ORAL_TABLET | Freq: Every day | ORAL | 3 refills | Status: DC

## 2016-07-23 MED ORDER — GLIPIZIDE 5 MG PO TABS: 5.0000 mg | ORAL_TABLET | Freq: Every day | ORAL | 3 refills | Status: DC

## 2016-07-23 MED ORDER — EMPAGLIFLOZIN 10 MG PO TABS: 10.0000 mg | ORAL_TABLET | Freq: Every day | ORAL | 3 refills | Status: DC

## 2016-07-23 NOTE — Assessment & Plan Note (Signed)
Noted on exam. PACs and PVCs on EKG. We'll forward to cardiology for review. She is asymptomatic.

## 2016-07-23 NOTE — Assessment & Plan Note (Signed)
Patient reports minimal chest congestion. She has a benign exam today. She did recently discontinue her fluid pill and thus we will check a BNP to ensure that she is not volume overloaded leading to this sensation. Discussed using her albuterol nebulizer to see if this would be beneficial given the wheezing. Discussed that if she develops any new or worsening symptoms she should be evaluated.

## 2016-07-23 NOTE — Patient Instructions (Addendum)
Nice to see you. We are going to increase your losartan dose. You should start checking your blood pressure consistently. We'll increase her dose of glipizide to 5 mg daily. You need to eat with this. When you're able to afford insulin please let us know. Please monitor your breathing and if it worsens or you develop any new symptoms please seek medical attention immediately. If you develop chest pain, shortness of breath, cooperative blood, or any new or changing symptoms please seek medical attention immediately.

## 2016-07-23 NOTE — Assessment & Plan Note (Signed)
Not a goal. We will increase losartan today. She'll continue her other blood pressure medications. We'll check a BMP.

## 2016-07-23 NOTE — Assessment & Plan Note (Addendum)
Uncontrolled. She's unable to afford insulin at this time. We will go up on her glipizide. She'll continue her metformin. Initially we did discuss starting her on Jardiance though decided to go up on her glipizide. I called her pharmacy and canceled the Jardiance prescription. When she is able to afford insulin she will contact us.

## 2016-07-23 NOTE — Progress Notes (Signed)
  [name], MD Phone: [phone]  Debra Saunders is a 71 y.o. female who presents today for follow-up.  DIABETES Disease Monitoring: Blood Sugar ranges-280's-390's Polyuria/phagia/dipsia- polyuria and polydipsia      Medications: Compliance- taking metformin, glipizide, glargine 22 u though this is too expensive and is unsure if she can afford insulin at this time Hypoglycemic symptoms- no  HYPERTENSION  Disease Monitoring  Home BP Monitoring not checking Chest pain- no    Dyspnea- states progressively a little worse than previously Medications  Compliance-  Taking losartan and diltiazem.  Edema- no  Patient now she feels a little congested in her chest. Feels as though she she was sick previously interesting quite get rid of everything. Occasionally feels as though there is something popping and she breathes. A little wheezing. No chest pain. She has not had increase her oxygen. No cough. She's not been using her neb treatments.  Heart rate noted to be a little irregular on exam. She has no palpitations.  PMH: former smoker   ROS see history of present illness  Objective  Physical Exam Vitals:   07/23/16 1551 07/23/16 1626  BP: (!) 160/84 (!) 164/96  Pulse: (!) 102   Resp: 16   Temp: 98.2 F (36.8 C)     BP Readings from Last 3 Encounters:  07/23/16 (!) 164/96  07/22/16 (!) 176/88  06/09/16 130/70   Wt Readings from Last 3 Encounters:  07/23/16 233 lb (105.7 kg)  07/22/16 233 lb (105.7 kg)  06/09/16 234 lb (106.1 kg)    Physical Exam  Constitutional: No distress.  HENT:  Head: Normocephalic and atraumatic.  Mouth/Throat: Oropharynx is clear and moist. No oropharyngeal exudate.  Eyes: Conjunctivae are normal. Pupils are equal, round, and reactive to light.  Cardiovascular: Normal rate and normal heart sounds.  A regularly irregular rhythm present.  Pulmonary/Chest: Effort normal and breath sounds normal.  Musculoskeletal: She exhibits no edema.    Neurological: She is alert.  Skin: Skin is warm and dry. She is not diaphoretic.   EKG: Sinus tachycardia, rate 102, multiple PVCs and PACs, no ischemic changes  Assessment/Plan: Please see individual problem list.  Diabetes ([place]) Uncontrolled. She's unable to afford insulin at this time. We will go up on her glipizide. She'll continue her metformin. Initially we did discuss starting her on Jardiance though decided to go up on her glipizide. I called her pharmacy and canceled the Jardiance prescription. When she is able to afford insulin she will contact us.  Hypertension Not a goal. We will increase losartan today. She'll continue her other blood pressure medications. We'll check a BMP.  Irregular heart beat Noted on exam. PACs and PVCs on EKG. We'll forward to cardiology for review. She is asymptomatic.  Chest congestion Patient reports minimal chest congestion. She has a benign exam today. She did recently discontinue her fluid pill and thus we will check a BNP to ensure that she is not volume overloaded leading to this sensation. Discussed using her albuterol nebulizer to see if this would be beneficial given the wheezing. Discussed that if she develops any new or worsening symptoms she should be evaluated.   Orders Placed This Encounter  Procedures  . Flu vaccine HIGH DOSE PF  . Comp Met (CMET)  . B Nat Peptide  . POCT glycosylated hemoglobin (Hb A1C)  . EKG 12-Lead    [name], MD [place]

## 2016-07-23 NOTE — Progress Notes (Signed)
Pre-visit discussion using our clinic review tool. No additional management support is needed unless otherwise documented below in the visit note.  

## 2016-07-24 LAB — COMPREHENSIVE METABOLIC PANEL
ALT: 61 U/L — ABNORMAL HIGH (ref 0–35)
AST: 58 U/L — ABNORMAL HIGH (ref 0–37)
Albumin: 3.9 g/dL (ref 3.5–5.2)
Alkaline Phosphatase: 124 U/L — ABNORMAL HIGH (ref 39–117)
BUN: 18 mg/dL (ref 6–23)
CO2: 32 mEq/L (ref 19–32)
Calcium: 9.8 mg/dL (ref 8.4–10.5)
Chloride: 99 mEq/L (ref 96–112)
Creatinine, Ser: 0.75 mg/dL (ref 0.40–1.20)
GFR: 97.4 mL/min (ref >60.00)
Glucose, Bld: 218 mg/dL — ABNORMAL HIGH (ref 70–99)
Potassium: 4 mEq/L (ref 3.5–5.1)
Sodium: 135 mEq/L (ref 135–145)
Total Bilirubin: 0.3 mg/dL (ref 0.2–1.2)
Total Protein: 8.1 g/dL (ref 6.0–8.3)

## 2016-07-24 LAB — BRAIN NATRIURETIC PEPTIDE: Brain Natriuretic Peptide: 38 pg/mL (ref <100)

## 2016-07-28 DIAGNOSIS — J9611 Chronic respiratory failure with hypoxia: Secondary | ICD-10-CM | POA: Diagnosis not present

## 2016-07-31 ENCOUNTER — Telehealth: Payer: Self-pay | Admitting: Family Medicine

## 2016-07-31 NOTE — Telephone Encounter (Signed)
I called pt and left a vm to call the office to sch a AWV. Thank you!

## 2016-08-06 LAB — CUP PACEART INCLINIC DEVICE CHECK
Brady Statistic RA Percent Paced: 30 %
Brady Statistic RV Percent Paced: 1 % — CL
Date Time Interrogation Session: 20180214115036
Implantable Lead Implant Date: 20150720
Implantable Lead Implant Date: 20150720
Implantable Lead Location: 753859
Implantable Lead Location: 753860
Implantable Lead Model: 5076
Implantable Lead Model: 5076
Implantable Pulse Generator Implant Date: 20150720
Lead Channel Pacing Threshold Amplitude: 0.5 V
Lead Channel Pacing Threshold Amplitude: 0.9 V
Lead Channel Pacing Threshold Pulse Width: 0.4 ms
Lead Channel Pacing Threshold Pulse Width: 0.4 ms
Lead Channel Sensing Intrinsic Amplitude: 17.1 mV
Lead Channel Sensing Intrinsic Amplitude: 3.6 mV
Lead Channel Setting Pacing Amplitude: 2 V
Lead Channel Setting Pacing Amplitude: 2.4 V
Lead Channel Setting Pacing Pulse Width: 0.4 ms
Lead Channel Setting Sensing Sensitivity: 2.5 mV
Pulse Gen Serial Number: 390330

## 2016-08-13 ENCOUNTER — Ambulatory Visit (INDEPENDENT_AMBULATORY_CARE_PROVIDER_SITE_OTHER): Payer: Medicare HMO | Admitting: Vascular Surgery

## 2016-08-13 ENCOUNTER — Ambulatory Visit (INDEPENDENT_AMBULATORY_CARE_PROVIDER_SITE_OTHER): Payer: Medicare HMO

## 2016-08-13 ENCOUNTER — Telehealth: Payer: Self-pay | Admitting: Family Medicine

## 2016-08-13 DIAGNOSIS — M79605 Pain in left leg: Secondary | ICD-10-CM

## 2016-08-13 NOTE — Telephone Encounter (Signed)
I called pt and left a vm to call the office to sch AWV. Thank You!

## 2016-08-16 DIAGNOSIS — I509 Heart failure, unspecified: Secondary | ICD-10-CM | POA: Diagnosis not present

## 2016-08-18 ENCOUNTER — Encounter (INDEPENDENT_AMBULATORY_CARE_PROVIDER_SITE_OTHER): Payer: Self-pay | Admitting: Vascular Surgery

## 2016-08-19 DIAGNOSIS — K228 Other specified diseases of esophagus: Secondary | ICD-10-CM | POA: Diagnosis not present

## 2016-08-19 DIAGNOSIS — R195 Other fecal abnormalities: Secondary | ICD-10-CM | POA: Diagnosis not present

## 2016-08-19 DIAGNOSIS — K760 Fatty (change of) liver, not elsewhere classified: Secondary | ICD-10-CM | POA: Diagnosis not present

## 2016-08-19 DIAGNOSIS — R1011 Right upper quadrant pain: Secondary | ICD-10-CM | POA: Diagnosis not present

## 2016-08-25 DIAGNOSIS — J9611 Chronic respiratory failure with hypoxia: Secondary | ICD-10-CM | POA: Diagnosis not present

## 2016-08-26 ENCOUNTER — Ambulatory Visit (INDEPENDENT_AMBULATORY_CARE_PROVIDER_SITE_OTHER): Payer: Medicare HMO | Admitting: *Deleted

## 2016-08-26 DIAGNOSIS — I495 Sick sinus syndrome: Secondary | ICD-10-CM

## 2016-08-26 NOTE — Progress Notes (Signed)
Remote pacemaker transmission.   

## 2016-08-27 ENCOUNTER — Encounter: Payer: Self-pay | Admitting: Cardiology

## 2016-08-27 LAB — CUP PACEART REMOTE DEVICE CHECK
Battery Remaining Longevity: 132 mo
Battery Remaining Percentage: 100 %
Brady Statistic RA Percent Paced: 39 %
Brady Statistic RV Percent Paced: 0 %
Date Time Interrogation Session: 20180306085900
Implantable Lead Implant Date: 20150720
Implantable Lead Implant Date: 20150720
Implantable Lead Location: 753859
Implantable Lead Location: 753860
Implantable Lead Model: 5076
Implantable Lead Model: 5076
Implantable Pulse Generator Implant Date: 20150720
Lead Channel Impedance Value: 442 Ohm
Lead Channel Impedance Value: 611 Ohm
Lead Channel Pacing Threshold Amplitude: 0.9 V
Lead Channel Pacing Threshold Pulse Width: 0.4 ms
Lead Channel Setting Pacing Amplitude: 2 V
Lead Channel Setting Pacing Amplitude: 2.4 V
Lead Channel Setting Pacing Pulse Width: 0.4 ms
Lead Channel Setting Sensing Sensitivity: 2.5 mV
Pulse Gen Serial Number: 390330

## 2016-09-08 ENCOUNTER — Ambulatory Visit (INDEPENDENT_AMBULATORY_CARE_PROVIDER_SITE_OTHER): Payer: Medicare HMO | Admitting: Family Medicine

## 2016-09-08 ENCOUNTER — Encounter: Payer: Self-pay | Admitting: Family Medicine

## 2016-09-08 VITALS — BP 150/82 | HR 67 | Temp 98.6°F | Wt 235.2 lb

## 2016-09-08 DIAGNOSIS — I82502 Chronic embolism and thrombosis of unspecified deep veins of left lower extremity: Secondary | ICD-10-CM | POA: Diagnosis not present

## 2016-09-08 DIAGNOSIS — I10 Essential (primary) hypertension: Secondary | ICD-10-CM | POA: Diagnosis not present

## 2016-09-08 DIAGNOSIS — Z794 Long term (current) use of insulin: Secondary | ICD-10-CM | POA: Diagnosis not present

## 2016-09-08 DIAGNOSIS — E118 Type 2 diabetes mellitus with unspecified complications: Secondary | ICD-10-CM

## 2016-09-08 DIAGNOSIS — I5032 Chronic diastolic (congestive) heart failure: Secondary | ICD-10-CM | POA: Diagnosis not present

## 2016-09-08 MED ORDER — BASAGLAR KWIKPEN 100 UNIT/ML ~~LOC~~ SOPN: 10.0000 [IU] | PEN_INJECTOR | Freq: Every day | SUBCUTANEOUS | 1 refills | Status: DC

## 2016-09-08 NOTE — Assessment & Plan Note (Signed)
Remains uncontrolled. Patient thinks she may be able to afford her insulin at this time. Given her uncontrolled sugars I believe she would benefit from this. We will start her back on insulin glargine 10 units daily. She'll continue her metformin and her glipizide. She'll continue to monitor her sugars. I discussed hypoglycemic protocol. We'll see her back in 6 weeks for an A1c.

## 2016-09-08 NOTE — Patient Instructions (Addendum)
Nice to see you. Please start checking your blood pressure on a daily basis. Please contact us in 1-2 weeks to let us know what it has been running at home. Please continue to check your blood sugars. We will add back your insulin. You will be on 10 units daily of this. Please monitor your sugars. I'll see you back in 6 weeks. You need to use your lymphedema pumps and contact Dr. Bunnie Domino office for the compression stockings. You will also need to keep your legs elevated as much as possible and take your torsemide once daily.

## 2016-09-08 NOTE — Assessment & Plan Note (Signed)
Slightly elevated. Improved on recheck. She's not checking at all at home. We will have her check at home and then contact us in the next 1-2 weeks to let us know what it has been running. We'll follow-up in 6 weeks as well.

## 2016-09-08 NOTE — Progress Notes (Signed)
Pre visit review using our clinic review tool, if applicable. No additional management support is needed unless otherwise documented below in the visit note. 

## 2016-09-08 NOTE — Progress Notes (Signed)
Debra Rumps, MD Phone: [phone]  Debra Saunders is a 71 y.o. female who presents today for follow-up.  Diabetes: Notes her sugars are typically 230-260. She's been on metformin once daily and glipizide once daily. Does note some polydipsia. No hypoglycemia or polyuria. She does note some mild loose stools with the metformin. She was previously on insulin glargine 22 units nightly though could not afford this. She thinks she will be through with her deductible and may be able to afford this now.  Hypertension: Not checking at home. Is taking losartan. Does not take her torsemide. No chest pain. Dyspnea has been relatively stable. Did have to increase her oxygen on one single occasion to 4 L. Has been stable on 3 L of oxygen since then. No cough or congestion. No orthopnea or PND.  Patient has a chronic left lower extremity DVT. Notes the swelling has been somewhat worsened over the last week or so. She has not been using her lymphedema pumps as she is supposed to. She is also not propping her legs up or using compression stockings. Notes it does hurt if she lets her legs hanging down for too long. No erythema.  PMH: Former smoker   ROS see history of present illness  Objective  Physical Exam Vitals:   09/08/16 1400 09/08/16 1419  BP: (!) 166/90 (!) 150/82  Pulse: 67   Temp: 98.6 F (37 C)     BP Readings from Last 3 Encounters:  09/08/16 (!) 150/82  07/23/16 (!) 164/96  07/22/16 (!) 176/88   Wt Readings from Last 3 Encounters:  09/08/16 235 lb 3.2 oz (106.7 kg)  07/23/16 233 lb (105.7 kg)  07/22/16 233 lb (105.7 kg)    Physical Exam  Constitutional: No distress.  Cardiovascular: Normal rate, regular rhythm and normal heart sounds.   Pulmonary/Chest: Effort normal and breath sounds normal. No respiratory distress. She has no wheezes. She has no rales.  Musculoskeletal:  Left lower extremity swollen slightly more than previously, minimal tightness and tenderness to  the left lower extremity, feet are warm with good capillary refill, no cords palpated, patient propped her legs up halfway through the visit and within 15-20 minutes the swelling had reduced significantly in the left lower extremity, right lower extremity nonswollen, no ulcerations or ischemia noted in left lower extremity  Neurological: She is alert.  Skin: Skin is warm and dry. She is not diaphoretic.     Assessment/Plan: Please see individual problem list.  Hypertension Slightly elevated. Improved on recheck. She's not checking at all at home. We will have her check at home and then contact us in the next 1-2 weeks to let us know what it has been running. We'll follow-up in 6 weeks as well.  Chronic deep vein thrombosis (DVT) (HCC) Patient with slight increased swelling over the last week. She's not been following directions for compression, elevation, and use of her lymphedema pump. She had quite a bit improvement in her swelling with just propping her legs up in the office. 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 She does have an IVC filter in place. Discussed contacting the vascular surgeon's office to get compression stockings. Advised on elevation and using her lymphedema pump after review of vascular surgeon's note. They will continue to monitor.  Chronic diastolic heart failure (HCC) Weight has been relatively stable. No significant breathing issues or orthopnea or PND. Encouraged her to use her torsemide at least once a day if not twice a  day. She'll continue to monitor.  Diabetes ([place]) Remains uncontrolled. Patient thinks she may be able to afford her insulin at this time. Given her uncontrolled sugars I believe she would benefit from this. We will start her back on insulin glargine 10 units daily. She'll continue her metformin and her glipizide. She'll continue to monitor her sugars. I discussed hypoglycemic protocol. We'll see her back in 6  weeks for an A1c.   No orders of the defined types were placed in this encounter.   Meds ordered this encounter  Medications  . Insulin Glargine (BASAGLAR KWIKPEN) 100 UNIT/ML SOPN    Sig: Inject 0.1 mLs (10 Units total) into the skin at bedtime.    Dispense:  15 mL    Refill:  [place], MD [place]

## 2016-09-08 NOTE — Assessment & Plan Note (Signed)
Patient with slight increased swelling over the last week. She's not been following directions for compression, elevation, and use of her lymphedema pump. She had quite a bit improvement in her swelling with just propping her legs up in the office. 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 She does have an IVC filter in place. Discussed contacting the vascular surgeon's office to get compression stockings. Advised on elevation and using her lymphedema pump after review of vascular surgeon's note. They will continue to monitor.

## 2016-09-08 NOTE — Assessment & Plan Note (Signed)
Weight has been relatively stable. No significant breathing issues or orthopnea or PND. Encouraged her to use her torsemide at least once a day if not twice a day. She'll continue to monitor.

## 2016-09-09 ENCOUNTER — Telehealth: Payer: Self-pay

## 2016-09-09 NOTE — Telephone Encounter (Signed)
PA for [employer] pen completed on cover my meds

## 2016-09-11 NOTE — Telephone Encounter (Signed)
FYI insurance called to verify why Basaglar woulld be more appropriate for patient than Levemir or lantus, advised insurance that patient had tried levemir and continued to have out of control CBG's and that in testing Basaglar has been proven to be more effective in controlling out of range CBG'S with patient with Co morbidities .

## 2016-09-12 MED ORDER — INSULIN GLARGINE 100 UNIT/ML SOLOSTAR PEN: 10.0000 [IU] | PEN_INJECTOR | Freq: Every day | SUBCUTANEOUS | 3 refills | Status: DC

## 2016-09-12 NOTE — Telephone Encounter (Signed)
Lantus sent to pharmacy.

## 2016-09-12 NOTE — Addendum Note (Signed)
Addended by: [name] ERIC G on: 09/12/2016 12:55 PM   Modules accepted: Orders

## 2016-09-12 NOTE — Telephone Encounter (Signed)
PA denied, needs to have tried the preferred drugs lantus solostar, Futures trader and tresiba flextouch first. Please advise, thanks

## 2016-09-13 DIAGNOSIS — I509 Heart failure, unspecified: Secondary | ICD-10-CM | POA: Diagnosis not present

## 2016-09-17 ENCOUNTER — Encounter: Payer: Self-pay | Admitting: Family Medicine

## 2016-09-25 DIAGNOSIS — J9611 Chronic respiratory failure with hypoxia: Secondary | ICD-10-CM | POA: Diagnosis not present

## 2016-09-29 ENCOUNTER — Ambulatory Visit: Payer: Medicare HMO | Admitting: Cardiovascular Disease

## 2016-10-03 ENCOUNTER — Other Ambulatory Visit: Payer: Self-pay | Admitting: Student

## 2016-10-03 DIAGNOSIS — R195 Other fecal abnormalities: Secondary | ICD-10-CM

## 2016-10-06 ENCOUNTER — Telehealth: Payer: Self-pay | Admitting: Cardiovascular Disease

## 2016-10-06 NOTE — Telephone Encounter (Signed)
Pt needs sooner appt w/Dr. Fletcher Anon for clearance for 5/16 colonoscopy with [facility]. Left message on pt's home VM regarding 4/26 appt. Awaiting call back to confirm.

## 2016-10-06 NOTE — Telephone Encounter (Signed)
Pt states she can not make 4/26 appt as her ride service needs 2 weeks notice. Transferred to scheduling

## 2016-10-14 DIAGNOSIS — I509 Heart failure, unspecified: Secondary | ICD-10-CM | POA: Diagnosis not present

## 2016-10-16 ENCOUNTER — Ambulatory Visit: Payer: Medicare HMO | Admitting: Cardiovascular Disease

## 2016-10-21 ENCOUNTER — Encounter: Payer: Self-pay | Admitting: Family Medicine

## 2016-10-21 ENCOUNTER — Other Ambulatory Visit: Payer: Self-pay | Admitting: Family Medicine

## 2016-10-21 ENCOUNTER — Ambulatory Visit (INDEPENDENT_AMBULATORY_CARE_PROVIDER_SITE_OTHER): Payer: Medicare HMO | Admitting: Family Medicine

## 2016-10-21 VITALS — BP 166/84 | HR 76 | Temp 98.5°F | Wt 231.4 lb

## 2016-10-21 DIAGNOSIS — R079 Chest pain, unspecified: Secondary | ICD-10-CM

## 2016-10-21 DIAGNOSIS — G8929 Other chronic pain: Secondary | ICD-10-CM | POA: Diagnosis not present

## 2016-10-21 DIAGNOSIS — J9611 Chronic respiratory failure with hypoxia: Secondary | ICD-10-CM | POA: Diagnosis not present

## 2016-10-21 DIAGNOSIS — I499 Cardiac arrhythmia, unspecified: Secondary | ICD-10-CM

## 2016-10-21 DIAGNOSIS — E118 Type 2 diabetes mellitus with unspecified complications: Secondary | ICD-10-CM

## 2016-10-21 DIAGNOSIS — Z794 Long term (current) use of insulin: Secondary | ICD-10-CM

## 2016-10-21 DIAGNOSIS — M545 Low back pain, unspecified: Secondary | ICD-10-CM

## 2016-10-21 DIAGNOSIS — I10 Essential (primary) hypertension: Secondary | ICD-10-CM | POA: Diagnosis not present

## 2016-10-21 LAB — POCT GLYCOSYLATED HEMOGLOBIN (HGB A1C): Hemoglobin A1C: 8.1

## 2016-10-21 MED ORDER — HYDROCHLOROTHIAZIDE 25 MG PO TABS: 12.5000 mg | ORAL_TABLET | Freq: Every day | ORAL | 1 refills | Status: DC

## 2016-10-21 MED ORDER — GLIPIZIDE 5 MG PO TABS: 5.0000 mg | ORAL_TABLET | Freq: Two times a day (BID) | ORAL | 3 refills | Status: DC

## 2016-10-21 NOTE — Patient Instructions (Addendum)
Nice to see you. We will add chlorothiazide your blood pressure medication regimen. We will recheck lab work in about 1-2 weeks. Please see cardiology as planned. We'll get she set up with pulmonology. We're going to have you take glipizide twice daily. If your blood sugar starts to run low with this please let us know.

## 2016-10-21 NOTE — Progress Notes (Signed)
[name], MD Phone: [phone]  Debra Saunders is a 71 y.o. female who presents today for follow-up.  Hypertension: Not checking at home. Taking diltiazem, losartan, torsemide. She notes chronic right-sided central chest discomfort that lasts for seconds and goes away on its own. Is not exertional. It is not associated shortness of breath. Does not radiate. She does not get diaphoretic with it. She's been evaluated on several occasions for this and it has been felt to be atypical and not related to a cardiac cause. She notes no orthopnea or PND.  She does have chronic hypoxic respiratory failure and is on home oxygen. She has not had to increase the oxygen level. She notes chronic shortness of breath with just walking. Notes her shortness of breath isn't any worse than previously though does occur slightly more frequently. She has not followed up with pulmonology. She has follow-up with cardiology next week.  Diabetes: Typically in the 200s in the morning though drops into the mid 100s during the days. Taking metformin 3-4 tablets a day. Taking glipizide once daily. No hypoglycemia. Some polydipsia. She's due for ophthalmology.  Chronic low back pain: Occasionally radiates to her left leg. Some numbness in her bilateral posterior thighs at times. No weakness. No loss of bowel or bladder function or saddle anesthesia. Flexeril does help with this. Rarely takes Percocet.  PMH: Former smoker   ROS see history of present illness  Objective  Physical Exam Vitals:   10/21/16 1406 10/21/16 1451  BP: (!) 158/78 (!) 166/84  Pulse: 76   Temp: 98.5 F (36.9 C)     BP Readings from Last 3 Encounters:  10/21/16 (!) 166/84  09/08/16 (!) 150/82  07/23/16 (!) 164/96   Wt Readings from Last 3 Encounters:  10/21/16 231 lb 6.4 oz (105 kg)  09/08/16 235 lb 3.2 oz (106.7 kg)  07/23/16 233 lb (105.7 kg)    Physical Exam  Constitutional: No distress.  Cardiovascular: Normal rate.  A  regularly irregular rhythm present.  Pulmonary/Chest: Effort normal and breath sounds normal.  Musculoskeletal:  Left leg with mild chronic edema that appears stable from last exam, right leg with no edema, no midline spine tenderness, no midline spine step-off, mild paraspinous thoracic muscular back tenderness  Neurological: She is alert.  5/5 strength in bilateral biceps, triceps, grip, quads, hamstrings, plantar and dorsiflexion, sensation to light touch intact in bilateral UE and LE  Skin: Skin is warm and dry. She is not diaphoretic.     Assessment/Plan: Please see individual problem list.  Hypertension Not at goal. Discussed this with the patient. She has been on chlorthalidone previously and she is unsure why they took her off of this. On review of the chart there is not an obvious reason. We'll start her on HCTZ. We'll have her return in 1-2 weeks for repeat lab work. She'll continue her other current medication.  Chronic respiratory failure with hypoxia (HCC) Oxygen levels have been stable. Has not had to increase her liters. Does note possible slight increase in frequency of shortness of breath though no worsening of the shortness of breath when it occurs. Needs follow-up with pulmonology. We will get this arranged.  Diabetes ([place]) Not at goal. A1c slightly improved from previously. She will continue her metformin. We will have her take her glipizide twice daily. She'll monitor her sugars. She'll follow-up in about a month with our pharmacist. She'll see me in 3 months.  Chronic back pain Chronic issue. Unchanged. Continue as needed Flexeril.  Irregular heart beat Similar to her January exam. No palpitations. Asymptomatic. PACs and PVCs on EKG at that time. Suspect related to similar. She'll see cardiology next week. Given return precautions.  Pain in the chest Patient with chronic atypical musculoskeletal chest pain. Unchanged from prior. She'll monitor and if changes or is  persistent she will be evaluated.   Orders Placed This Encounter  Procedures  . Basic Metabolic Panel (BMET)    Standing Status:   Future    Standing Expiration Date:   10/21/2017  . POCT HgB A1C    Meds ordered this encounter  Medications  . hydrochlorothiazide (HYDRODIURIL) 25 MG tablet    Sig: Take 0.5 tablets (12.5 mg total) by mouth daily.    Dispense:  45 tablet    Refill:  1  . glipiZIDE (GLUCOTROL) 5 MG tablet    Sig: Take 1 tablet (5 mg total) by mouth 2 (two) times daily before a meal.    Dispense:  180 tablet    Refill:  [place], MD [place]

## 2016-10-21 NOTE — Assessment & Plan Note (Signed)
Similar to her January exam. No palpitations. Asymptomatic. PACs and PVCs on EKG at that time. Suspect related to similar. She'll see cardiology next week. Given return precautions.

## 2016-10-21 NOTE — Assessment & Plan Note (Signed)
Not at goal. Discussed this with the patient. She has been on chlorthalidone previously and she is unsure why they took her off of this. On review of the chart there is not an obvious reason. We'll start her on HCTZ. We'll have her return in 1-2 weeks for repeat lab work. She'll continue her other current medication.

## 2016-10-21 NOTE — Assessment & Plan Note (Signed)
Oxygen levels have been stable. Has not had to increase her liters. Does note possible slight increase in frequency of shortness of breath though no worsening of the shortness of breath when it occurs. Needs follow-up with pulmonology. We will get this arranged.

## 2016-10-21 NOTE — Assessment & Plan Note (Signed)
Not at goal. A1c slightly improved from previously. She will continue her metformin. We will have her take her glipizide twice daily. She'll monitor her sugars. She'll follow-up in about a month with our pharmacist. She'll see me in 3 months.

## 2016-10-21 NOTE — Assessment & Plan Note (Signed)
Patient with chronic atypical musculoskeletal chest pain. Unchanged from prior. She'll monitor and if changes or is persistent she will be evaluated.

## 2016-10-21 NOTE — Assessment & Plan Note (Signed)
Chronic issue. Unchanged. Continue as needed Flexeril.

## 2016-10-21 NOTE — Progress Notes (Signed)
Pre visit review using our clinic review tool, if applicable. No additional management support is needed unless otherwise documented below in the visit note. 

## 2016-10-25 DIAGNOSIS — J9611 Chronic respiratory failure with hypoxia: Secondary | ICD-10-CM | POA: Diagnosis not present

## 2016-10-28 ENCOUNTER — Telehealth: Payer: Self-pay | Admitting: Cardiovascular Disease

## 2016-10-28 NOTE — Telephone Encounter (Signed)
Pt cancelled/rescheduled two appointments with Dr. Fletcher Anon that were needed for cardiac clearance before 5/16 colonoscopy.  Left message at [facility] for Tammi Klippel, PA that pt has rescheduled appt for May 29.

## 2016-10-30 ENCOUNTER — Other Ambulatory Visit: Payer: Medicare HMO

## 2016-10-30 ENCOUNTER — Ambulatory Visit: Payer: Medicare HMO | Admitting: Cardiovascular Disease

## 2016-11-05 ENCOUNTER — Ambulatory Visit: Admit: 2016-11-05 | Payer: Medicare HMO | Admitting: Unknown Physician Specialty

## 2016-11-05 SURGERY — ESOPHAGOGASTRODUODENOSCOPY (EGD) WITH PROPOFOL
Anesthesia: General

## 2016-11-12 ENCOUNTER — Other Ambulatory Visit (INDEPENDENT_AMBULATORY_CARE_PROVIDER_SITE_OTHER): Payer: Medicare HMO

## 2016-11-12 DIAGNOSIS — I10 Essential (primary) hypertension: Secondary | ICD-10-CM

## 2016-11-12 LAB — BASIC METABOLIC PANEL
BUN: 18 mg/dL (ref 6–23)
CO2: 26 mEq/L (ref 19–32)
Calcium: 9.5 mg/dL (ref 8.4–10.5)
Chloride: 98 mEq/L (ref 96–112)
Creatinine, Ser: 0.85 mg/dL (ref 0.40–1.20)
GFR: 84.23 mL/min (ref >60.00)
Glucose, Bld: 211 mg/dL — ABNORMAL HIGH (ref 70–99)
Potassium: 3.9 mEq/L (ref 3.5–5.1)
Sodium: 135 mEq/L (ref 135–145)

## 2016-11-13 DIAGNOSIS — I509 Heart failure, unspecified: Secondary | ICD-10-CM | POA: Diagnosis not present

## 2016-11-14 ENCOUNTER — Telehealth: Payer: Self-pay

## 2016-11-14 NOTE — Telephone Encounter (Signed)
-----   Message from [name], MD sent at 11/14/2016  8:36 AM EDT ----- Please let the patient know her glucose was elevated on lab work though otherwise was acceptable. Thanks.

## 2016-11-14 NOTE — Telephone Encounter (Signed)
Left message to return call 

## 2016-11-18 ENCOUNTER — Encounter: Payer: Self-pay | Admitting: Nurse Practitioner

## 2016-11-18 ENCOUNTER — Ambulatory Visit (INDEPENDENT_AMBULATORY_CARE_PROVIDER_SITE_OTHER): Payer: Medicare HMO | Admitting: Nurse Practitioner

## 2016-11-18 VITALS — BP 148/70 | HR 80 | Ht 60.0 in | Wt 229.0 lb

## 2016-11-18 DIAGNOSIS — I951 Orthostatic hypotension: Secondary | ICD-10-CM

## 2016-11-18 DIAGNOSIS — R002 Palpitations: Secondary | ICD-10-CM

## 2016-11-18 DIAGNOSIS — I10 Essential (primary) hypertension: Secondary | ICD-10-CM | POA: Diagnosis not present

## 2016-11-18 DIAGNOSIS — I5032 Chronic diastolic (congestive) heart failure: Secondary | ICD-10-CM

## 2016-11-18 DIAGNOSIS — I493 Ventricular premature depolarization: Secondary | ICD-10-CM

## 2016-11-18 MED ORDER — LOSARTAN POTASSIUM 50 MG PO TABS: 50.0000 mg | ORAL_TABLET | Freq: Every day | ORAL | 3 refills | Status: DC

## 2016-11-18 MED ORDER — METOPROLOL TARTRATE 25 MG PO TABS: 25.0000 mg | ORAL_TABLET | Freq: Two times a day (BID) | ORAL | 3 refills | Status: DC

## 2016-11-18 NOTE — Patient Instructions (Signed)
Medication Instructions:  Your physician has recommended you make the following change in your medication:  1- DECREASE Losartan to 50 mg by mouth once a day. 2- START taking Metoprolol tartrate 25 mg (1 tablet) by mouth two times a day.   Labwork: none  Testing/Procedures: none  Follow-Up: Your physician recommends that you schedule a follow-up appointment in: [place], NP.  If you need a refill on your cardiac medications before your next appointment, please call your pharmacy.

## 2016-11-18 NOTE — Progress Notes (Signed)
Office Visit    Patient Name: Debra Saunders Date of Encounter: 11/18/2016  Primary Care Provider:  [name], MD Primary Cardiologist:  Jerilynn Mages. Fletcher Anon, MD / S. Caryl Comes, MD   Chief Complaint    71 y/o ? with a h/o right sided msk c/p, diast chf, HTN, HL, symptomatic bradycardia s/p PPM, DMII, obesity, COPD, and resp failure on home O2, who presents for f/u related to increasing palpitations.  Past Medical History    Past Medical History:  Diagnosis Date  . Acute renal failure ([place])   . Arthritis   . Chest pain    a. 01/2012 Cath: nonobs dzs;  c. 04/2014 MV: No ischemia.  . Chronic back pain   . Chronic diastolic CHF (congestive heart failure) ([place])    a. Dx 2005 @ [facility];  b. 02/2014 Echo: Ef 55-60%, no rwma, mild MR, PASP 45mHg.  .Marland KitchenChronic respiratory failure (HCC)    a. on home O2  . COPD (chronic obstructive pulmonary disease) ([place]    a. Home O2 - 3lpm  . Gastritis   . History of intracranial hemorrhage    a. 11/2012 subarachnoid hemorrhage in the setting of [place] PE.  .Marland KitchenHyperlipidemia   . Hypertension   . Lipoma of back   . Lymphedema    a. Chronic LLE edema.  . Obesity   . Sepsis with metabolic encephalopathy ([place]   . Sleep apnea   . Streptococcal bacteremia   . Stroke ([place]   . Symptomatic bradycardia    a. s/p BSX PPM.  . Thyroid disorder   . Thyroid nodule   . Type II diabetes mellitus ([place]   . Urinary incontinence    Past Surgical History:  Procedure Laterality Date  . ABDOMINAL HYSTERECTOMY  1969  . CARDIAC CATHETERIZATION  [place]   [place] No obstructive CAD: Only 20% ostial left circumflex.  . CHOLECYSTECTOMY    . CYST REMOVAL TRUNK  2002   BACK  . INSERT / REPLACE / REMOVE PACEMAKER    . lipoma removal  2014   back  . PACEMAKER INSERTION  [address] pacemaker implanted by Dr KCaryl Comesfor symptomatic bradycardia  . PERMANENT PACEMAKER INSERTION N/A 01/09/2014   Procedure: PERMANENT PACEMAKER INSERTION;  Surgeon:  SDeboraha Sprang MD;  [facility];  Service: Cardiovascular;  Laterality: N/A;  . TRIGGER FINGER RELEASE    . VAGINAL HYSTERECTOMY      Allergies  Allergies  Allergen Reactions  . Penicillins Hives, Shortness Of Breath, Swelling and Other (See Comments)    Has patient had a PCN reaction causing immediate rash, facial/tongue/throat swelling, SOB or lightheadedness with hypotension: Yes Has patient had a PCN reaction causing severe rash involving mucus membranes or skin necrosis: No Has patient had a PCN reaction that required hospitalization No Has patient had a PCN reaction occurring within the last 10 years: No If all of the above answers are "NO", then may proceed with Cephalosporin use.  . Aspirin Hives and Other (See Comments)    Pt states that she is unable to take because she had bleeding in her brain.    . Other Other (See Comments)    Pt states that she is unable to take blood thinners because she had bleeding in her brain.     History of Present Illness    71y/o ? with the above complex PMH including chronic MSK right sided chest pain s/p prior cath  revealing no obs CAD, and nl stress test in 2015.  Other h/o includes diast chf, HTN, HL, DM II, and symptomatic bradycardia s/p BSX PPM.  She was last seen her in 05/2016, @ which point she was doing well.  She says that over the past 4-5 months, she has noted increasing freq of palpitations, often occurring multiple x/day, associated with a "rumbling" in her left chest, lasting a few seconds, and resolving spontaneously.  She has not had any prolonged tachycardias of which she is aware.  She has also been having increasing dizzy and lightheaded spells.  Six months or more ago, these were likely to occur with standing, but now she mostly notices lightheadedness while lying in bed and watching TV.  She doesn't think that [place] is linked to palpitations.  Spells can last a few mins and may be exacerbated by moving her head from side  to side.  Sometimes standing makes it better.    Her left leg edema has been stable.  She actually stopped taking torsemide b/c she felt like between torsemide and HCTZ, she was always running to the bathroom.  Despite stopping torsemide, her wt is actually down.  Her LLE edema has been largely dependent on how much sitting or standing she does during the day.  She denies chest pain, pnd, orthopnea, n, v, syncope, weight gain, or early satiety.    Home Medications    Prior to Admission medications   Medication Sig Start Date End Date Taking? Authorizing Provider  acetaminophen (TYLENOL) 325 MG tablet Take 325-650 mg by mouth every 6 (six) hours as needed for mild pain. Reported on 10/10/2015   Yes [provider]  albuterol (PROVENTIL) (2.5 MG/3ML) 0.083% nebulizer solution Take 3 mLs (2.5 mg total) by nebulization every 6 (six) hours as needed for wheezing or shortness of breath. 07/16/15  Yes Laverle Hobby, MD  Blood Glucose Monitoring Suppl (TRUE METRIX AIR GLUCOSE METER) w/Device KIT USE AS DIRECTED 01/02/16  Yes Jackolyn Confer, MD  cyclobenzaprine (FLEXERIL) 5 MG tablet Take 1 tablet (5 mg total) by mouth 3 (three) times daily as needed for muscle spasms. 07/23/16  Yes [name], MD  diltiazem (CARDIZEM CD) 240 MG 24 hr capsule Take 1 capsule (240 mg total) by mouth daily. 05/27/16 11/18/16 Yes Deboraha Sprang, MD  EASY TOUCH INSULIN SYRINGE 29G X 1/2" 1 ML MISC USE AS DIRECTED WITH  LEVEMIR  INSULIN 12/24/15  Yes Jackolyn Confer, MD  gabapentin (NEURONTIN) 100 MG capsule Take 2 capsules (200 mg total) by mouth 3 (three) times daily. 07/23/16  Yes [name], MD  glipiZIDE (GLUCOTROL) 5 MG tablet Take 1 tablet (5 mg total) by mouth 2 (two) times daily before a meal. 10/21/16  Yes [name], MD  glucose blood test strip Use as instructed to check blood glucose three times a day. e11.8 06/09/16  Yes [name], MD  hydrochlorothiazide (HYDRODIURIL)  25 MG tablet Take 0.5 tablets (12.5 mg total) by mouth daily. 10/21/16  Yes [name], MD  Insulin Glargine (LANTUS SOLOSTAR) 100 UNIT/ML Solostar Pen Inject 10 Units into the skin daily at 10 pm. 09/12/16  Yes [name], MD  KLOR-CON M20 20 MEQ tablet TAKE ONE TABLET BY MOUTH TWICE DAILY 10/22/16  Yes [name], MD  Lancets MISC Use as directed to check blood sugar three times daily e11.8 10/31/15  Yes Jackolyn Confer, MD  meclizine (ANTIVERT) 25 MG tablet Take 25  mg by mouth daily as needed for dizziness. Reported on 10/10/2015   Yes [provider]  Menthol, Topical Analgesic, (BIOFREEZE EX) Apply 1 application topically as needed (for pain).    Yes [provider]  metFORMIN (GLUCOPHAGE) 500 MG tablet Take 1 tablet (500 mg total) by mouth daily with breakfast. Patient taking differently: Take 2,000 mg by mouth daily with breakfast.  06/27/16  Yes [name], MD  naproxen (EC NAPROSYN) 500 MG EC tablet Take 500 mg by mouth 2 (two) times daily as needed (for pain). Reported on 01/04/2016   Yes [provider]  ondansetron (ZOFRAN) 8 MG tablet Take 1 tablet (8 mg total) by mouth every 8 (eight) hours as needed for nausea or vomiting. 09/25/14  Yes Jackolyn Confer, MD  oxyCODONE-acetaminophen (PERCOCET/ROXICET) 5-325 MG tablet Take 1 tablet by mouth every 8 (eight) hours as needed for severe pain. 01/07/16  Yes Jackolyn Confer, MD  OXYGEN Inhale into the lungs. 3 liters   Yes [provider]  polyethylene glycol (MIRALAX / GLYCOLAX) packet Take 17 g by mouth daily as needed for moderate constipation.   Yes [provider]  promethazine (PHENERGAN) 25 MG tablet Take 25 mg by mouth every 4 (four) hours as needed for nausea or vomiting. Reported on 10/10/2015   Yes [provider]  torsemide (DEMADEX) 20 MG tablet Take 1 tablet (20 mg total) by mouth 2 (two) times daily. 10/19/15  Yes Jackolyn Confer, MD  losartan  (COZAAR) 50 MG tablet Take 1 tablet (50 mg total) by mouth daily. 11/18/16 02/16/17  Rogelia Mire, NP  metoprolol tartrate (LOPRESSOR) 25 MG tablet Take 1 tablet (25 mg total) by mouth 2 (two) times daily. 11/18/16 02/16/17  Rogelia Mire, NP    Review of Systems    Palpitations, dizziness/LH, lower ext swelling as outlined above.  She also has chronic, stable DOE and intermittent right sided chest soreness.  All other systems reviewed and are otherwise negative except as noted above.  Physical Exam    VS:  BP (!) 148/70 (BP Location: Left Arm, Patient Position: Sitting, Cuff Size: Large)   Pulse 80   Ht 5' (1.524 m)   Wt 229 lb (103.9 kg)   BMI 44.72 kg/m  , BMI Body mass index is 44.72 kg/m.  Orthostatic VS for the past 24 hrs:  BP- Lying Pulse- Lying BP- Sitting Pulse- Sitting BP- Standing at 0 minutes Pulse- Standing at 0 minutes  11/18/16 1424 136/84 71 138/82 71 128/74 87    GEN: Well nourished, well developed, in no acute distress.  HEENT: normal.  Neck: Supple, obese, difficult to gauge jvp.  No carotid bruits, or masses. Cardiac: RRR, 2/6 syst murmur @ RUSB, no rubs, or gallops. No clubbing, cyanosis, 1+ Left lower ext edema to knee.  Radials/DP/PT 2+ and equal bilaterally.  Respiratory:  Respirations regular and unlabored, clear to auscultation bilaterally. GI: Soft, nontender, nondistended, BS + x 4. MS: no deformity or atrophy. Skin: warm and dry, no rash. Neuro:  Strength and sensation are intact. Psych: Normal affect.  Accessory Clinical Findings    ECG - RSR, 80, left axis, LVH, freq pvc's/bigeminy.  Lab Results  Component Value Date   CREATININE 0.85 11/12/2016   BUN 18 11/12/2016   NA 135 11/12/2016   K 3.9 11/12/2016   CL 98 11/12/2016   CO2 26 11/12/2016      Assessment & Plan    1.  Palpitations:  Pt  has noted more frequent palpitations over the past few months.  These are brief and described as "rumbling" in her chest.  No associated  Ss.  She had labs on 5/23, which showed nl K and stable renal fxn.  ECG today shows freq PVC's/bigeminy.  I will add lopressor 25 mg bid.  She has also had increasing dizziness and LH w/o syncope, though she says that those Ss occur in the absence of palpitations.  I have asked her to submit a remote pacer transmission when she next has dizziness/LH @ home.  2.  Lightheadedness/mild orthostatic hypotension:  She reports a h/o LH with standing but over the past several months, this has been occurring @ rest as well.  Remote transmission requested during Ss as outlined above.  As she is mildly orthostatic, since I am adding low dose  blocker for PVC's, I will reduce losartan to 50 mg daily.  3.  Chronic Diastolic CHF:  Volume stable and wt down.  BP stable  mildly orthostatic.  Med changes as outlined above.  Of note, she is currently only taking HCTZ 12.5 mg daily.  She came off of torsemide.  Wt is down and volume appears to be stable today.  We discussed the importance of daily weights, sodium restriction, medication compliance, and symptom reporting and she verbalizes understanding.   4.  PAT:  Previously noted on pacer interrogation.  Though she notes palpitations, these seem to be more isolated and more consistent with PVC's (she had symptoms during 12 lead today).  Cont dilt.  5.  HTN:  Mildly orthostatic today.  Reducing losartan to make room for low dose  blocker in the setting of above.  6.  DMII: no longer on insulin due to cost.  Metformin per PCP.  7.  Dispo:  F/u in 1 month or sooner if necessary.  Murray Hodgkins, NP 11/18/2016, 3:26 PM

## 2016-11-18 NOTE — Telephone Encounter (Signed)
Left message to return call 

## 2016-11-19 NOTE — Telephone Encounter (Signed)
Patient was informed of Dr. Ellen Henri statement , if there's any information to add to his information please call pt at [phone].

## 2016-11-22 ENCOUNTER — Other Ambulatory Visit: Payer: Self-pay | Admitting: Family Medicine

## 2016-11-25 DIAGNOSIS — J9611 Chronic respiratory failure with hypoxia: Secondary | ICD-10-CM | POA: Diagnosis not present

## 2016-11-26 ENCOUNTER — Ambulatory Visit
Admission: RE | Admit: 2016-11-26 | Discharge: 2016-11-26 | Disposition: A | Discharge status: Home / Self Care | Payer: Medicare HMO | Source: Ambulatory Visit | Attending: Student | Admitting: Student

## 2016-11-26 DIAGNOSIS — R195 Other fecal abnormalities: Secondary | ICD-10-CM

## 2016-11-26 DIAGNOSIS — K5730 Diverticulosis of large intestine without perforation or abscess without bleeding: Secondary | ICD-10-CM | POA: Diagnosis not present

## 2016-11-26 DIAGNOSIS — K6389 Other specified diseases of intestine: Secondary | ICD-10-CM | POA: Diagnosis not present

## 2016-11-28 ENCOUNTER — Other Ambulatory Visit: Payer: Self-pay | Admitting: Family Medicine

## 2016-11-28 ENCOUNTER — Encounter: Payer: Self-pay | Admitting: Family Medicine

## 2016-11-28 MED ORDER — METFORMIN HCL 500 MG PO TABS: 1000.0000 mg | ORAL_TABLET | Freq: Two times a day (BID) | ORAL | 1 refills | Status: DC

## 2016-12-01 NOTE — Telephone Encounter (Signed)
Left message to rturn call to schedule appointment with pharmacist

## 2016-12-04 ENCOUNTER — Ambulatory Visit: Payer: Medicare HMO | Admitting: Cardiovascular Disease

## 2016-12-08 ENCOUNTER — Ambulatory Visit (INDEPENDENT_AMBULATORY_CARE_PROVIDER_SITE_OTHER): Payer: Medicare HMO | Admitting: Pharmacist

## 2016-12-08 ENCOUNTER — Encounter: Payer: Self-pay | Admitting: Pharmacist

## 2016-12-08 DIAGNOSIS — E118 Type 2 diabetes mellitus with unspecified complications: Secondary | ICD-10-CM | POA: Diagnosis not present

## 2016-12-08 DIAGNOSIS — I10 Essential (primary) hypertension: Secondary | ICD-10-CM

## 2016-12-08 DIAGNOSIS — E785 Hyperlipidemia, unspecified: Secondary | ICD-10-CM

## 2016-12-08 DIAGNOSIS — Z794 Long term (current) use of insulin: Secondary | ICD-10-CM

## 2016-12-08 MED ORDER — DULAGLUTIDE 0.75 MG/0.5ML ~~LOC~~ SOAJ: 0.7500 mg | SUBCUTANEOUS | 0 refills | Status: DC

## 2016-12-08 NOTE — Patient Instructions (Addendum)
Thank you for coming in today.  Please start [name] 4.58 mg weekly (samples provided) Stop Glipizide  Please call clinic if you have any signs or symptoms of hypoglycemia or any side effects with the medication  Followup with Bennye Alm, PharmD in 3-4 weeks.

## 2016-12-08 NOTE — Progress Notes (Addendum)
S:    Chief Complaint  Patient presents with  . Medication Management    Diabetes   Patient arrives in good spirits ambulating with a walker and accompanied by her daughter.  Presents for diabetes evaluation, education, and management at the request of Dr [name]. Patient was referred on 11/28/16 (patient email note).  Patient was last seen by Primary Care Provider on 10/21/16.   Patient reports Diabetes was diagnosed in ~2005.   Family/Social History: Mother (Heart Failure/MI)  Patient reports adherence with medications.  Current diabetes medications include: metformin 1000 mg BID, glipizide 5 mg BID Current hypertension medications include: diltiazem, HCTZ,  Losartan, metoprolol, torsemide  Patient reports hypoglycemic events when she doesn't eat all day twice per week (does not have an appetite)  Patient reported dietary habits: Eats 1-2 meals/day (mostly 1 and snack) Breakfast:scrambled eggs, ham or sausage, toast, grits Dinner:banana sandwich, peanut butter and jelly sandwich Drinks:water, coffee with sweet and low  Patient reported exercise habits: On home O2 and uses walker.     Patient denies nocturia.  Patient denies neuropathy in feet/ankles but does report it in her fingers.   Patient reports visual changes when she is dizzy.   Patient denies self foot exams.    Has been having dizziness almost every day.  Denies falls.  Reports dizziness laying, sitting, not moving. Does report nausea with the dizziness.    Does report some diarrhea with metformin.    Fasting today 188.  Reports most fastings >200 mg/dL Reports post prandials in 200-300s.   Reports QHS CBGs in 200-300s as well.    O:  Physical Exam  Musculoskeletal: She exhibits edema.   Review of Systems  Constitutional: Negative.    Lab Results  Component Value Date   HGBA1C 8.1 10/21/2016   Lipid Panel     Component Value Date/Time   CHOL 160 03/28/2015 0415   CHOL 137 02/03/2013 0441   TRIG  107 03/28/2015 0415   TRIG 197 02/03/2013 0441   HDL 48 03/28/2015 0415   HDL 25 (L) 02/03/2013 0441   CHOLHDL 3.3 03/28/2015 0415   VLDL 21 03/28/2015 0415   VLDL 39 02/03/2013 0441   LDLCALC 91 03/28/2015 0415   LDLCALC 73 02/03/2013 0441    Vitals:   12/08/16 1022  BP: (!) 155/65  Pulse: 64    A/P: Diabetes longstanding diagnosed currently above goal. Patient reports hypoglycemic events several times per week when she doesn't eat and is able to verbalize appropriate hypoglycemia management plan. Patient reports adherence with medication. Control is suboptimal due to diet and insulin resistance. Following discussion and approval by Dr [name], the following medication changes were made:  -Discussed insulin vs SGLT2 inhibitors vs GLP1 agonists with patient and cost of all of these medications.  Patient believes she can afford the ~$45 copay for one of these medications and is open to trial of non insulin.  -Started [name] (dulaglutide) 1.5 mg weekly (Samples Provided). Discussed efficacy, side effects, and proper administration  -Stopped Glipizide due to hypoglycemia -continue Metformin Next A1C anticipated August 2018.    ASCVD risk greater than 7.5%. Patient had bleeding with aspirin.  No statin on med list when reconciled with patient.  Consider statin at next visit.   Hypertension longstanding diagnosed currently above goal.  Patient reports adherence with medication. Reassess Blood Pressure at next visit.  Written patient instructions provided.  Total time in face to face counseling 50 minutes.   Follow up in [facility]  Visit in 3-4 weeks.    Patient was seen with Dr [name] today in clinic and medication changes were discussed and approved prior to initiation

## 2016-12-09 ENCOUNTER — Encounter: Payer: Self-pay | Admitting: Pharmacist

## 2016-12-09 NOTE — Assessment & Plan Note (Signed)
Hypertension longstanding diagnosed currently above goal.  Patient reports adherence with medication. Reassess Blood Pressure at next visit.

## 2016-12-09 NOTE — Assessment & Plan Note (Signed)
Diabetes longstanding diagnosed currently above goal. Patient reports hypoglycemic events several times per week when she doesn't eat and is able to verbalize appropriate hypoglycemia management plan. Patient reports adherence with medication. Control is suboptimal due to diet and insulin resistance. Following discussion and approval by Dr [name], the following medication changes were made:  -Discussed insulin vs SGLT2 inhibitors vs GLP1 agonists with patient and cost of all of these medications.  Patient believes she can afford the ~$45 copay for one of these medications and is open to trial of non insulin.  -Started [name] (dulaglutide) 1.5 mg weekly (Samples Provided). Discussed efficacy, side effects, and proper administration  -Stopped Glipizide due to hypoglycemia -continue Metformin Next A1C anticipated August 2018.

## 2016-12-09 NOTE — Assessment & Plan Note (Signed)
ASCVD risk greater than 7.5%. Patient had bleeding with aspirin.  No statin on med list when reconciled with patient.  Consider statin at next visit.

## 2016-12-10 DIAGNOSIS — R195 Other fecal abnormalities: Secondary | ICD-10-CM | POA: Diagnosis not present

## 2016-12-10 DIAGNOSIS — K5909 Other constipation: Secondary | ICD-10-CM | POA: Diagnosis not present

## 2016-12-10 DIAGNOSIS — K760 Fatty (change of) liver, not elsewhere classified: Secondary | ICD-10-CM | POA: Diagnosis not present

## 2016-12-10 DIAGNOSIS — K228 Other specified diseases of esophagus: Secondary | ICD-10-CM | POA: Diagnosis not present

## 2016-12-10 DIAGNOSIS — R933 Abnormal findings on diagnostic imaging of other parts of digestive tract: Secondary | ICD-10-CM | POA: Diagnosis not present

## 2016-12-10 DIAGNOSIS — R1011 Right upper quadrant pain: Secondary | ICD-10-CM | POA: Diagnosis not present

## 2016-12-10 NOTE — Progress Notes (Addendum)
I have reviewed the above note and agree. I saw the patient with the pharmacist. [facility] plan on following up on her dizziness at her next visit. It appears she has discussed this with cardiology at her most recent visit.  [name], M.D.

## 2016-12-14 DIAGNOSIS — I509 Heart failure, unspecified: Secondary | ICD-10-CM | POA: Diagnosis not present

## 2016-12-15 ENCOUNTER — Ambulatory Visit (INDEPENDENT_AMBULATORY_CARE_PROVIDER_SITE_OTHER): Payer: Medicare HMO | Admitting: Nurse Practitioner

## 2016-12-15 ENCOUNTER — Encounter: Payer: Self-pay | Admitting: Nurse Practitioner

## 2016-12-15 VITALS — BP 156/80 | HR 92 | Ht 61.0 in | Wt 230.5 lb

## 2016-12-15 DIAGNOSIS — R011 Cardiac murmur, unspecified: Secondary | ICD-10-CM

## 2016-12-15 DIAGNOSIS — R002 Palpitations: Secondary | ICD-10-CM | POA: Diagnosis not present

## 2016-12-15 DIAGNOSIS — I10 Essential (primary) hypertension: Secondary | ICD-10-CM

## 2016-12-15 DIAGNOSIS — I5032 Chronic diastolic (congestive) heart failure: Secondary | ICD-10-CM | POA: Diagnosis not present

## 2016-12-15 DIAGNOSIS — I471 Supraventricular tachycardia: Secondary | ICD-10-CM | POA: Diagnosis not present

## 2016-12-15 DIAGNOSIS — R42 Dizziness and giddiness: Secondary | ICD-10-CM

## 2016-12-15 MED ORDER — METOPROLOL TARTRATE 50 MG PO TABS: 50.0000 mg | ORAL_TABLET | Freq: Two times a day (BID) | ORAL | 3 refills | Status: DC

## 2016-12-15 NOTE — Progress Notes (Signed)
Office Visit    Patient Name: Debra Saunders Date of Encounter: 12/15/2016  Primary Care Provider:  [name], MD Primary Cardiologist:  Jerilynn Mages. Fletcher Anon, MD / S. Caryl Comes, MD   Chief Complaint    71 y/o ? with a h/o right sided msk c/p, diast chf, HTN, HL, symptomatic bradycardia s/p PPM, DMII, obesity, COPD, and resp failure on home O2, who presents for f/u related to palps and dizziness.  Past Medical History    Past Medical History:  Diagnosis Date  . Acute renal failure ([place])   . Arthritis   . Chest pain    a. 01/2012 Cath: nonobs dzs;  c. 04/2014 MV: No ischemia.  . Chronic back pain   . Chronic diastolic CHF (congestive heart failure) ([place])    a. Dx 2005 @ [facility];  b. 02/2014 Echo: Ef 55-60%, no rwma, mild MR, PASP 63mHg.  .Marland KitchenChronic respiratory failure (HCC)    a. on home O2  . COPD ([place] obstructive pulmonary disease) ([place]    a. Home O2 - 3lpm  . Gastritis   . History of intracranial hemorrhage    a. 11/2012 subarachnoid hemorrhage in the [place] of [place].  .Marland KitchenHyperlipidemia   . Hypertension   . Lipoma of back   . Lymphedema    a. Chronic LLE edema.  . Obesity   . Sepsis with metabolic encephalopathy ([place]   . Sleep apnea   . Streptococcal bacteremia   . Stroke ([place]   . Symptomatic bradycardia    a. s/p BSX PPM.  . Thyroid disorder   . Thyroid nodule   . Type II diabetes mellitus ([place]   . Urinary incontinence    Past Surgical History:  Procedure Laterality Date  . ABDOMINAL HYSTERECTOMY  1969  . CARDIAC CATHETERIZATION  [place]   [place] No obstructive CAD: Only 20% ostial left circumflex.  . CHOLECYSTECTOMY    . CYST REMOVAL TRUNK  2002   BACK  . INSERT / REPLACE / REMOVE PACEMAKER    . lipoma removal  2014   back  . PACEMAKER INSERTION  [id number] dual [address] pacemaker implanted by Dr KCaryl Comesfor symptomatic bradycardia  . PERMANENT PACEMAKER INSERTION N/A 01/09/2014   Procedure: PERMANENT PACEMAKER INSERTION;  Surgeon: SDeboraha Sprang MD;  [facility];  Service: Cardiovascular;  Laterality: N/A;  . TRIGGER FINGER RELEASE    . VAGINAL HYSTERECTOMY      Allergies  Allergies  Allergen Reactions  . Aspirin Hives and Other (See Comments)    Pt states that she is unable to take because she had bleeding in her brain.    . Other Other (See Comments)    Pt states that she is unable to take blood thinners because she had bleeding in her brain.   .Marland KitchenPenicillins Hives, Shortness Of Breath, Swelling and Other (See Comments)    Has patient had a PCN reaction causing immediate rash, facial/tongue/throat swelling, SOB or lightheadedness with hypotension: Yes Has patient had a PCN reaction causing severe rash involving mucus membranes or skin necrosis: No Has patient had a PCN reaction that required hospitalization No Has patient had a PCN reaction occurring within the last 10 years: No If all of the above answers are "NO", then may proceed with Cephalosporin use.    History of Present Illness    71y/o ? with the above complex PMH including chronic MSK right sided chest pain s/p prior  cath revealing no obs CAD, and nl stress test in 2015.  Other h/o includes diast chf, HTN, HL, DM II, and symptomatic bradycardia s/p BSX PPM.  She has a 4-5 month h/o of intermittent palpitations and "rumbling" in her left chest.  She also has chronic dizziness.  This is worse with movement of her head. She has used meclizine for this in the past with some but limited effect.  She doesn't think she's ever seen PT for this. Given persistent nature of dizziness, she does not think that it's related to her palpitations.  I saw her in clinic on 5/29, @ which point she was having freq PVC's.  I added lopressor 25 bid.  Palpitations have been somewhat less frequent since then.  She has not submitted a remote transmission b/c palpitations have been so brief (she didn't realize that it would potentially provide data over a long period of time).   She does not check her bp @ home.  She has chronic, stable L LE edema.  She denies chest pain, pnd, orthopnea, n, v, syncope, weight gain, or early satiety.   Home Medications    Prior to Admission medications   Medication Sig Start Date End Date Taking? Authorizing Provider  acetaminophen (TYLENOL) 325 MG tablet Take 325-650 mg by mouth every 6 (six) hours as needed for mild pain. Reported on 10/10/2015   Yes [provider]  albuterol (PROVENTIL) (2.5 MG/3ML) 0.083% nebulizer solution Take 3 mLs (2.5 mg total) by nebulization every 6 (six) hours as needed for wheezing or shortness of breath. 07/16/15  Yes Laverle Hobby, MD  Blood Glucose Monitoring Suppl (TRUE METRIX AIR GLUCOSE METER) w/Device KIT USE AS DIRECTED 01/02/16  Yes Jackolyn Confer, MD  cyclobenzaprine (FLEXERIL) 5 MG tablet Take 1 tablet (5 mg total) by mouth 3 (three) times daily as needed for muscle spasms. 07/23/16  Yes [name], MD  diltiazem (CARDIZEM CD) 240 MG 24 hr capsule Take 1 capsule (240 mg total) by mouth daily. 05/27/16 12/08/17 Yes Deboraha Sprang, MD  Dulaglutide ([name]) 1.02 HE/5.2DP SOPN Inject 0.75 mg into the skin once a week. 12/08/16  Yes [name], MD  EASY TOUCH INSULIN SYRINGE 29G X 1/2" 1 ML MISC USE AS DIRECTED WITH  LEVEMIR  INSULIN 12/24/15  Yes Jackolyn Confer, MD  gabapentin (NEURONTIN) 100 MG capsule TAKE TWO CAPSULES BY MOUTH THREE TIMES DAILY 11/24/16  Yes [name], MD  glucose blood test strip Use as instructed to check blood glucose three times a day. e11.8 06/09/16  Yes [name], MD  hydrochlorothiazide (HYDRODIURIL) 25 MG tablet Take 0.5 tablets (12.5 mg total) by mouth daily. 10/21/16  Yes [name], MD  KLOR-CON M20 20 MEQ tablet TAKE ONE TABLET BY MOUTH TWICE DAILY 10/22/16  Yes [name], MD  Lancets MISC Use as directed to check blood sugar three times daily e11.8 10/31/15  Yes Jackolyn Confer, MD  losartan (COZAAR) 50  MG tablet Take 1 tablet (50 mg total) by mouth daily. 11/18/16 02/16/17 Yes Rogelia Mire, NP  meclizine (ANTIVERT) 25 MG tablet Take 25 mg by mouth daily as needed for dizziness. Reported on 10/10/2015   Yes [provider]  Menthol, Topical Analgesic, (BIOFREEZE EX) Apply 1 application topically as needed (for pain).    Yes [provider]  metFORMIN (GLUCOPHAGE) 500 MG tablet Take 2 tablets (1,000 mg total) by mouth 2 (two) times daily with a meal. 11/28/16  Yes [name], MD  naproxen (EC NAPROSYN) 500 MG EC tablet Take 500 mg by mouth 2 (two) times daily as needed (for pain). Reported on 01/04/2016   Yes [provider]  ondansetron (ZOFRAN) 8 MG tablet Take 1 tablet (8 mg total) by mouth every 8 (eight) hours as needed for nausea or vomiting. 09/25/14  Yes Jackolyn Confer, MD  oxyCODONE-acetaminophen (PERCOCET/ROXICET) 5-325 MG tablet Take 1 tablet by mouth every 8 (eight) hours as needed for severe pain. 01/07/16  Yes Jackolyn Confer, MD  OXYGEN Inhale into the lungs. 3 liters   Yes [provider]  polyethylene glycol (MIRALAX / GLYCOLAX) packet Take 17 g by mouth daily as needed for moderate constipation.   Yes [provider]  promethazine (PHENERGAN) 25 MG tablet Take 25 mg by mouth every 4 (four) hours as needed for nausea or vomiting. Reported on 10/10/2015   Yes [provider]  torsemide (DEMADEX) 20 MG tablet Take 1 tablet (20 mg total) by mouth 2 (two) times daily. Patient taking differently: Take 20 mg by mouth 2 (two) times daily as needed.  10/19/15  Yes Jackolyn Confer, MD  metoprolol tartrate (LOPRESSOR) 50 MG tablet Take 1 tablet (50 mg total) by mouth 2 (two) times daily. 12/15/16 03/15/17  Rogelia Mire, NP    Review of Systems    Dizziness, palps as outlined above.  She also has chronic DOE - uses home O2.  Chronic LLE edema.   All other systems reviewed and are otherwise negative except as noted  above.  Physical Exam    VS:  BP (!) 156/80 (BP Location: Left Arm, Patient Position: Sitting, Cuff Size: Normal)   Pulse 92   Ht _0  (1.549 m)   Wt 230 lb 8 oz (104.6 kg)   BMI 43.55 kg/m  , BMI Body mass index is 43.55 kg/m. GEN: obese, in no acute distress.  HEENT: normal.  Neck: Supple,obese, difficult to gauge jvp. No carotid bruits, or masses. Cardiac: RRR, 2/6 syst murmur @ RUSB, no rubs or gallops. No clubbing, cyanosis, 1+ LLE edema.  Radials/DP/PT 2+ and equal bilaterally.  Respiratory:  Respirations regular and unlabored, clear to auscultation bilaterally. GI: Soft, nontender, nondistended, BS + x 4. MS: no deformity or atrophy. Skin: warm and dry, no rash. Neuro:  Strength and sensation are intact. Psych: Normal affect.  Accessory Clinical Findings    ECG - A paced, PVC's, LAD.  Assessment & Plan    1.  Palpitations:  Somewhat improved on  blocker. Less frequent PVCs on ecg today.  We again discussed that if she sends a remote transmission, we will be able to evaluate her rhythm and assess for arrhythmias over time (not just at the time of an event).  She will send in a transmission.  BP elevated today.  I am increasing  blocker.  2. PVC's:  ? If this is what she feels as palps.  Titrating  blocker as above.  3.  HFpEF:  Volume appears stable.  Wt up slightly on our scale.  Titrating  blocker as above as bp elevated.  Cont HCTZ.  If wt rises @ home, she may need higher dose or resumption of torsemide (she took herself off due to frequent urination).  We discussed the importance of daily weights, sodium restriction, medication compliance, and symptom reporting and she verbalizes understanding.   4.  PAT:  Previously noted on pacer.  Awaiting remote transmission as above.  Cont  dilt. titrating  blocker.  5.  HTN:  BP elevated today.  We reduced losartan previously to make room for  blocker in the setting of above.  titrating  blocker today.  6.   Dizziness/Vertigo:  This is chronic.  I will refer to PT for exercises.  I suspect this may help her greatly.  7.  DM :  Metformin per PCP.  No longer on insulin 2/2 cost.  8.  Systolic murmur: f/u echo.  9.  Dispo:  F/u Dr. Fletcher Anon in 2-3 mos.   Murray Hodgkins, NP 12/15/2016, 4:30 PM

## 2016-12-15 NOTE — Patient Instructions (Signed)
Medication Instructions:  Your physician has recommended you make the following change in your medication:  1- INCREASE Lopressor to 50 mg by mouth twice a day.   Labwork: none  Testing/Procedures: Your physician has requested that you have an echocardiogram. Echocardiography is a painless test that uses sound waves to create images of your heart. It provides your doctor with information about the size and shape of your heart and how well your heart's chambers and valves are working. This procedure takes approximately one hour. There are no restrictions for this procedure.    Follow-Up: Your physician recommends that you schedule a follow-up appointment in: 2-3 [place].   If you need a refill on your cardiac medications before your next appointment, please call your pharmacy.   Echocardiogram An echocardiogram, or echocardiography, uses sound waves (ultrasound) to produce an image of your heart. The echocardiogram is simple, painless, obtained within a short period of time, and offers valuable information to your health care provider. The images from an echocardiogram can provide information such as:  Evidence of coronary artery disease (CAD).  Heart size.  Heart muscle function.  Heart valve function.  Aneurysm detection.  Evidence of a past heart attack.  Fluid buildup around the heart.  Heart muscle thickening.  Assess heart valve function.  Tell a health care provider about:  Any allergies you have.  All medicines you are taking, including vitamins, herbs, eye drops, creams, and over-the-counter medicines.  Any problems you or family members have had with anesthetic medicines.  Any blood disorders you have.  Any surgeries you have had.  Any medical conditions you have.  Whether you are pregnant or may be pregnant. What happens before the procedure? No special preparation is needed. Eat and drink normally. What happens during the  procedure?  In order to produce an image of your heart, gel will be applied to your chest and a wand-like tool (transducer) will be moved over your chest. The gel will help transmit the sound waves from the transducer. The sound waves will harmlessly bounce off your heart to allow the heart images to be captured in real-time motion. These images will then be recorded.  You may need an IV to receive a medicine that improves the quality of the pictures. What happens after the procedure? You may return to your normal schedule including diet, activities, and medicines, unless your health care provider tells you otherwise. This information is not intended to replace advice given to you by your health care provider. Make sure you discuss any questions you have with your health care provider. Document Released: 06/06/2000 Document Revised: 01/26/2016 Document Reviewed: 02/14/2013 Elsevier Interactive Patient Education  2017 [employer].

## 2016-12-25 DIAGNOSIS — J9611 Chronic respiratory failure with hypoxia: Secondary | ICD-10-CM | POA: Diagnosis not present

## 2016-12-29 ENCOUNTER — Encounter: Payer: Self-pay | Admitting: Pharmacist

## 2016-12-29 ENCOUNTER — Ambulatory Visit (INDEPENDENT_AMBULATORY_CARE_PROVIDER_SITE_OTHER): Payer: Medicare HMO | Admitting: Pharmacist

## 2016-12-29 DIAGNOSIS — Z794 Long term (current) use of insulin: Secondary | ICD-10-CM | POA: Diagnosis not present

## 2016-12-29 DIAGNOSIS — I10 Essential (primary) hypertension: Secondary | ICD-10-CM

## 2016-12-29 DIAGNOSIS — E118 Type 2 diabetes mellitus with unspecified complications: Secondary | ICD-10-CM | POA: Diagnosis not present

## 2016-12-29 DIAGNOSIS — E785 Hyperlipidemia, unspecified: Secondary | ICD-10-CM

## 2016-12-29 MED ORDER — DULAGLUTIDE 0.75 MG/0.5ML ~~LOC~~ SOAJ: 0.7500 mg | SUBCUTANEOUS | 0 refills | Status: DC

## 2016-12-29 MED ORDER — DULAGLUTIDE 0.75 MG/0.5ML ~~LOC~~ SOAJ: 0.7500 mg | SUBCUTANEOUS | 2 refills | Status: DC

## 2016-12-29 MED ORDER — ROSUVASTATIN CALCIUM 10 MG PO TABS: 10.0000 mg | ORAL_TABLET | Freq: Every day | ORAL | 3 refills | Status: DC

## 2016-12-29 NOTE — Progress Notes (Signed)
I have reviewed the above note and agree.  Tanika Bracco, M.D.  

## 2016-12-29 NOTE — Assessment & Plan Note (Signed)
ASCVD risk greater than 7.5%. Patient has hx of bleeding with aspirin.   -Discussed efficacy vs side effects of statin therapy -Start rosuvastatin 10 mg daily

## 2016-12-29 NOTE — Patient Instructions (Addendum)
Thank you for coming in today  Continue [name] 5.00 mg weekly  Start Rosuvastatin (Crestor) 10 mg daily   Followup with Dr [name] in August

## 2016-12-29 NOTE — Progress Notes (Signed)
S:     Chief Complaint  Patient presents with  . Medication Management    Diabetes    Patient arrives in good spirits ambulating with assistance of a walker and accompanied by her daughter.  Presents for diabetes evaluation, education, and management at the request of Dr [name]. Patient was referred on 11/28/16.  Patient was last seen by Primary Care Provider on 10/21/16.   Today patient reports improved CBGs and states she can afford the [name] copay + deductible.   Patient reports adherence with medications. She reports she did mess up her first dose of [name] and lost some of the dose.  She had some nausea and didn't like the injections at first but she reports this is improving.  Current diabetes medications include: [name] 0.26 mg weekly, metformin 1000 mg BID Current hypertension medications include: HCTZ 12.5 mg daily, losartan 50 mg daily, metoprolol 50 mg BID, torsemide 20 mg daily PRN, diltiazem 240 mg daily   Patient denies hypoglycemic events.  Patient reported dietary habits: Patient still has decreased appetite.  States she does eat breakfast and crackers throughout the day then she has also been eating dinner since last visit.  Does report eating something sweet once daily.    Patient reported exercise habits: limited mobility uses walker.    Patient denies nocturia. Denies pain/burning upon urination.  Patient denies neuropathy. Patient denies visual changes. Patient needs to schedule yearly eye exam. Patient reports self foot exams.  Denies changes.   Reports she is still dizzy all the time.  She denies dizziness getting worse since her cardiologist increased her metoprolol.    Patient reports she was previously on a statin several years ago but does not remember why it was stopped.  O:  Physical Exam  Musculoskeletal: She exhibits edema.  Lower extremity edema in left lower leg  Vitals reviewed.  Review of Systems  Constitutional: Negative.     Lab Results  Component Value Date   HGBA1C 8.1 10/21/2016   Lipid Panel     Component Value Date/Time   CHOL 160 03/28/2015 0415   CHOL 137 02/03/2013 0441   TRIG 107 03/28/2015 0415   TRIG 197 02/03/2013 0441   HDL 48 03/28/2015 0415   HDL 25 (L) 02/03/2013 0441   CHOLHDL 3.3 03/28/2015 0415   VLDL 21 03/28/2015 0415   VLDL 39 02/03/2013 0441   LDLCALC 91 03/28/2015 0415   LDLCALC 73 02/03/2013 0441    Vitals:   12/29/16 1055  BP: (!) 144/84  Pulse: 60    Home fasting CBG: 127, 143, 162, 130, 119, 150, 171, 158, 134 mg/dL Before dinner CBG: 136, 123, 144, 130, 151, 106, 162 mg/dL QHS CBG: 187, 126, 154 mg/dL   A/P: Diabetes longstanding/newly diagnosed currently above goal but with improved CBGs since starting [name]. Patient denies hypoglycemic events and is able to verbalize appropriate hypoglycemia management plan. Patient reports adherence with medication.  Following discussion and approval by Dr [name], the following medication changes were made:  -Continued [name] (dulaglutide) 0.75 mg weekly (sample provided). Consider increasing dose at next visit pending patient home CBGs. Patient states she can afford the copay + deductible for [name] -Continue metformin 1000 mg BID -Discussed signs/symptoms/treatment of hypoglycemia and low carbohydrate diet.   ASCVD risk greater than 7.5%. Patient has hx of bleeding with aspirin.   -Discussed efficacy vs side effects of statin therapy -Start rosuvastatin 10 mg daily   Hypertension longstanding diagnosed currently slightly above goal.  Higher  goal may be considered given patient age, comorbidity and reported dizziness.  Patient reports dizziness has not worsened since metoprolol was increased by cardiology. Patient reports adherence with medication. Continue current medications.  Written patient instructions provided.  Total time in face to face counseling 40 minutes.   Follow up with Dr [name] in August.      Patient was seen with Dr [name] today in clinic and medication changes were discussed and approved prior to initiation

## 2016-12-29 NOTE — Assessment & Plan Note (Signed)
Diabetes longstanding/newly diagnosed currently above goal but with improved CBGs since starting [name]. Patient denies hypoglycemic events and is able to verbalize appropriate hypoglycemia management plan. Patient reports adherence with medication.  Following discussion and approval by Dr [name], the following medication changes were made:  -Continued [name] (dulaglutide) 0.75 mg weekly (sample provided). Consider increasing dose at next visit pending patient home CBGs. Patient states she can afford the copay + deductible for [name] -Continue metformin 1000 mg BID -Discussed signs/symptoms/treatment of hypoglycemia and low carbohydrate diet.

## 2016-12-29 NOTE — Assessment & Plan Note (Signed)
Hypertension longstanding diagnosed currently slightly above goal.  Higher goal may be considered given patient age, comorbidity and reported dizziness.  Patient reports dizziness has not worsened since metoprolol was increased by cardiology. Patient reports adherence with medication. Continue current medications.

## 2016-12-31 ENCOUNTER — Other Ambulatory Visit: Payer: Medicare HMO

## 2017-01-05 ENCOUNTER — Ambulatory Visit: Payer: Medicare HMO | Admitting: Pharmacist

## 2017-01-13 DIAGNOSIS — I509 Heart failure, unspecified: Secondary | ICD-10-CM | POA: Diagnosis not present

## 2017-01-22 ENCOUNTER — Other Ambulatory Visit: Payer: Medicare HMO

## 2017-01-25 DIAGNOSIS — J9611 Chronic respiratory failure with hypoxia: Secondary | ICD-10-CM | POA: Diagnosis not present

## 2017-01-28 ENCOUNTER — Ambulatory Visit (INDEPENDENT_AMBULATORY_CARE_PROVIDER_SITE_OTHER): Payer: Medicare HMO

## 2017-01-28 ENCOUNTER — Telehealth: Payer: Self-pay | Admitting: Family Medicine

## 2017-01-28 ENCOUNTER — Other Ambulatory Visit: Payer: Self-pay | Admitting: Family Medicine

## 2017-01-28 ENCOUNTER — Encounter: Payer: Self-pay | Admitting: Family Medicine

## 2017-01-28 ENCOUNTER — Ambulatory Visit (INDEPENDENT_AMBULATORY_CARE_PROVIDER_SITE_OTHER): Payer: Medicare HMO | Admitting: Family Medicine

## 2017-01-28 VITALS — BP 122/78 | HR 62 | Temp 98.4°F | Resp 20 | Ht 61.0 in | Wt 222.4 lb

## 2017-01-28 DIAGNOSIS — M545 Low back pain, unspecified: Secondary | ICD-10-CM | POA: Insufficient documentation

## 2017-01-28 DIAGNOSIS — Z794 Long term (current) use of insulin: Secondary | ICD-10-CM | POA: Diagnosis not present

## 2017-01-28 DIAGNOSIS — E118 Type 2 diabetes mellitus with unspecified complications: Secondary | ICD-10-CM

## 2017-01-28 DIAGNOSIS — J9611 Chronic respiratory failure with hypoxia: Secondary | ICD-10-CM | POA: Diagnosis not present

## 2017-01-28 DIAGNOSIS — M47816 Spondylosis without myelopathy or radiculopathy, lumbar region: Secondary | ICD-10-CM | POA: Diagnosis not present

## 2017-01-28 LAB — COMPREHENSIVE METABOLIC PANEL
ALT: 43 U/L — ABNORMAL HIGH (ref 0–35)
AST: 40 U/L — ABNORMAL HIGH (ref 0–37)
Albumin: 3.7 g/dL (ref 3.5–5.2)
Alkaline Phosphatase: 117 U/L (ref 39–117)
BUN: 16 mg/dL (ref 6–23)
CO2: 32 mEq/L (ref 19–32)
Calcium: 9.3 mg/dL (ref 8.4–10.5)
Chloride: 97 mEq/L (ref 96–112)
Creatinine, Ser: 0.79 mg/dL (ref 0.40–1.20)
GFR: 91.6 mL/min (ref >60.00)
Glucose, Bld: 214 mg/dL — ABNORMAL HIGH (ref 70–99)
Potassium: 3.7 mEq/L (ref 3.5–5.1)
Sodium: 135 mEq/L (ref 135–145)
Total Bilirubin: 0.3 mg/dL (ref 0.2–1.2)
Total Protein: 7.9 g/dL (ref 6.0–8.3)

## 2017-01-28 LAB — HEMOGLOBIN A1C: Hgb A1c MFr Bld: 7 % — ABNORMAL HIGH (ref 4.6–6.5)

## 2017-01-28 MED ORDER — PREDNISONE 10 MG PO TABS: ORAL_TABLET | ORAL | 0 refills | Status: DC

## 2017-01-28 NOTE — Assessment & Plan Note (Signed)
Patient with likely acute low back muscular strain. Given slight numbness in right lateral thigh will obtain an x-ray of her low back. She has tried oxycodone and Flexeril with little benefit. We'll try a prednisone taper given that she has responded to steroids in the past. Discussed risk of hyperglycemia and fluid retention. She'll monitor for these things and if they occur she'll let us know. Given return precautions.

## 2017-01-28 NOTE — Assessment & Plan Note (Signed)
Stable. Refer to a new pulmonologist at her request.

## 2017-01-28 NOTE — Telephone Encounter (Signed)
Reschedule appointment with Denisa during follow up with sonnenberg. 3 months.

## 2017-01-28 NOTE — Progress Notes (Signed)
  [name], MD Phone: [phone]  Debra Saunders is a 71 y.o. female who presents today for follow-up.  Low back pain: Patient notes about 3 weeks ago she was folding sheets and reached up and suddenly felt a discomfort in her low back. Occasionally radiates into right lateral thigh. Occasionally has numbness down her right lateral thigh. Does not extend to her foot or ankle. No saddle anesthesia. No numbness elsewhere. No change in bowel or bladder function. No weakness. She has tried Flexeril and oxycodone with little benefit. In the past she has gotten steroids for this with good benefit.  Diabetes: Typically running in the 150-200 range. Has been taking [name]. Has not been on the metformin since her back has been bothering her. No nausea. Stable urination and thirst. No hyperglycemia.  Chronic respiratory failure: Followed by pulmonology though has not followed with him in some time. Notes her dyspnea stable. Has not had to use her albuterol. Has not had increase her oxygen.  PMH: Former smoker   ROS see history of present illness  Objective  Physical Exam Vitals:   01/28/17 1050  BP: 122/78  Pulse: 62  Resp: 20  Temp: 98.4 F (36.9 C)    BP Readings from Last 3 Encounters:  01/28/17 122/78  12/29/16 (!) 144/84  12/15/16 (!) 156/80   Wt Readings from Last 3 Encounters:  01/28/17 222 lb 6 oz (100.9 kg)  12/29/16 229 lb 9.6 oz (104.1 kg)  12/15/16 230 lb 8 oz (104.6 kg)    Physical Exam  Constitutional: No distress.  Cardiovascular: Normal rate, regular rhythm and normal heart sounds.   Pulmonary/Chest: Effort normal and breath sounds normal.  Musculoskeletal:  No midline spine tenderness, no midline spine step-off, there is bilateral SI joint tenderness, no overlying skin changes  Neurological: She is alert.  5/5 strength bilateral quads, hamstrings, plantar flexion, and dorsiflexion, sensation light touch intact bilaterally extremities  Skin: Skin is  warm and dry. She is not diaphoretic.     Assessment/Plan: Please see individual problem list.  Chronic respiratory failure with hypoxia (HCC) Stable. Refer to a new pulmonologist at her request.  Diabetes [facility]) Check lab work today. Continue [name]. Encouraged metformin.  Acute bilateral low back pain without sciatica Patient with likely acute low back muscular strain. Given slight numbness in right lateral thigh will obtain an x-ray of her low back. She has tried oxycodone and Flexeril with little benefit. We'll try a prednisone taper given that she has responded to steroids in the past. Discussed risk of hyperglycemia and fluid retention. She'll monitor for these things and if they occur she'll let us know. Given return precautions.   Orders Placed This Encounter  Procedures  . Comp Met (CMET)  . HgB A1c  . Ambulatory referral to Pulmonology    Referral Priority:   Routine    Referral Type:   Consultation    Referral Reason:   Specialty Services Required    Requested Specialty:   Pulmonary Disease    Number of Visits Requested:   1    Meds ordered this encounter  Medications  . predniSONE (DELTASONE) 10 MG tablet    Sig: Take 40 mg (4 tablets) by mouth on days 1 and 2, then decrease by 1 tablet daily until gone    Dispense:  14 tablet    Refill:  0   [name], MD [place]

## 2017-01-28 NOTE — Patient Instructions (Signed)
Nice to see you. We will treat your low back pain with a short course of prednisone to be tapered. This may cause your blood sugar to go up and if it goes up excessively in 2 the upper 200s or 300s please let us know. This could also cause fluid accumulation. Please monitor your fluid in your legs and your weight. If this goes up or increases or you develop trouble breathing while on the prednisone please let us know. We'll refer you to a new pulmonologist.

## 2017-01-28 NOTE — Assessment & Plan Note (Signed)
Check lab work today. Continue [name]. Encouraged metformin.

## 2017-01-30 ENCOUNTER — Telehealth: Payer: Self-pay | Admitting: Family Medicine

## 2017-01-30 NOTE — Telephone Encounter (Signed)
Per Denisa, pt will call back to schedule AWV  NOTE* Never had AWV before

## 2017-02-05 ENCOUNTER — Other Ambulatory Visit: Payer: Self-pay | Admitting: Family Medicine

## 2017-02-10 ENCOUNTER — Other Ambulatory Visit: Payer: Self-pay | Admitting: Nurse Practitioner

## 2017-02-10 DIAGNOSIS — R011 Cardiac murmur, unspecified: Secondary | ICD-10-CM

## 2017-02-10 DIAGNOSIS — I5032 Chronic diastolic (congestive) heart failure: Secondary | ICD-10-CM

## 2017-02-11 ENCOUNTER — Ambulatory Visit (INDEPENDENT_AMBULATORY_CARE_PROVIDER_SITE_OTHER): Payer: Medicare HMO

## 2017-02-11 ENCOUNTER — Other Ambulatory Visit: Payer: Self-pay

## 2017-02-11 DIAGNOSIS — R011 Cardiac murmur, unspecified: Secondary | ICD-10-CM | POA: Diagnosis not present

## 2017-02-11 DIAGNOSIS — I5032 Chronic diastolic (congestive) heart failure: Secondary | ICD-10-CM | POA: Diagnosis not present

## 2017-02-13 DIAGNOSIS — I509 Heart failure, unspecified: Secondary | ICD-10-CM | POA: Diagnosis not present

## 2017-02-16 ENCOUNTER — Ambulatory Visit: Payer: Medicare HMO | Admitting: Cardiovascular Disease

## 2017-02-25 DIAGNOSIS — J9611 Chronic respiratory failure with hypoxia: Secondary | ICD-10-CM | POA: Diagnosis not present

## 2017-03-02 DIAGNOSIS — J449 Chronic obstructive pulmonary disease, unspecified: Secondary | ICD-10-CM | POA: Diagnosis not present

## 2017-03-02 DIAGNOSIS — G4733 Obstructive sleep apnea (adult) (pediatric): Secondary | ICD-10-CM | POA: Diagnosis not present

## 2017-03-02 DIAGNOSIS — R0602 Shortness of breath: Secondary | ICD-10-CM | POA: Diagnosis not present

## 2017-03-02 DIAGNOSIS — I272 Pulmonary hypertension, unspecified: Secondary | ICD-10-CM | POA: Diagnosis not present

## 2017-03-02 DIAGNOSIS — Z6841 Body Mass Index (BMI) 40.0 and over, adult: Secondary | ICD-10-CM | POA: Diagnosis not present

## 2017-03-04 ENCOUNTER — Telehealth: Payer: Self-pay

## 2017-03-04 NOTE — Telephone Encounter (Signed)
Thank you :)

## 2017-03-04 NOTE — Telephone Encounter (Signed)
Pt is scheduled for follow up and AWV appt.

## 2017-03-04 NOTE — Telephone Encounter (Signed)
Please schedule patient for follow up with Sonnenberg and AWV around 04/30/2017. Thank you!

## 2017-03-04 NOTE — Telephone Encounter (Signed)
Your welcome.

## 2017-03-16 DIAGNOSIS — I509 Heart failure, unspecified: Secondary | ICD-10-CM | POA: Diagnosis not present

## 2017-03-23 DIAGNOSIS — I272 Pulmonary hypertension, unspecified: Secondary | ICD-10-CM | POA: Diagnosis not present

## 2017-03-23 DIAGNOSIS — R0602 Shortness of breath: Secondary | ICD-10-CM | POA: Diagnosis not present

## 2017-03-23 DIAGNOSIS — J810 Acute pulmonary edema: Secondary | ICD-10-CM | POA: Diagnosis not present

## 2017-03-27 DIAGNOSIS — J9611 Chronic respiratory failure with hypoxia: Secondary | ICD-10-CM | POA: Diagnosis not present

## 2017-04-04 ENCOUNTER — Other Ambulatory Visit: Payer: Self-pay | Admitting: Family Medicine

## 2017-04-11 ENCOUNTER — Other Ambulatory Visit: Payer: Self-pay | Admitting: Family Medicine

## 2017-04-11 DIAGNOSIS — E118 Type 2 diabetes mellitus with unspecified complications: Secondary | ICD-10-CM

## 2017-04-11 DIAGNOSIS — Z794 Long term (current) use of insulin: Secondary | ICD-10-CM

## 2017-04-15 DIAGNOSIS — I509 Heart failure, unspecified: Secondary | ICD-10-CM | POA: Diagnosis not present

## 2017-04-25 ENCOUNTER — Other Ambulatory Visit: Payer: Self-pay | Admitting: Family Medicine

## 2017-04-27 DIAGNOSIS — J9611 Chronic respiratory failure with hypoxia: Secondary | ICD-10-CM | POA: Diagnosis not present

## 2017-05-08 ENCOUNTER — Encounter: Payer: Self-pay | Admitting: Family Medicine

## 2017-05-11 ENCOUNTER — Telehealth: Payer: Self-pay | Admitting: Pharmacist

## 2017-05-11 NOTE — Telephone Encounter (Signed)
Called patient in response to inquiry about alt options for injectable therapy as cost has significantly increased. Patient reports that [name] is the expensive medication and she confirms that she is in the donut hole. Asked patient to come into the clinic next Monday with financial documentation so we can fill out a patient assistance application.   Patient agreeable and thankful.   Carlean Jews, Pharm.D. PGY2 Ambulatory Care Pharmacy Resident Phone: [phone]

## 2017-05-16 DIAGNOSIS — I509 Heart failure, unspecified: Secondary | ICD-10-CM | POA: Diagnosis not present

## 2017-05-20 DIAGNOSIS — R0602 Shortness of breath: Secondary | ICD-10-CM | POA: Diagnosis not present

## 2017-05-20 DIAGNOSIS — Z6841 Body Mass Index (BMI) 40.0 and over, adult: Secondary | ICD-10-CM | POA: Diagnosis not present

## 2017-05-20 DIAGNOSIS — R0902 Hypoxemia: Secondary | ICD-10-CM | POA: Diagnosis not present

## 2017-05-20 DIAGNOSIS — I272 Pulmonary hypertension, unspecified: Secondary | ICD-10-CM | POA: Diagnosis not present

## 2017-05-27 DIAGNOSIS — J9611 Chronic respiratory failure with hypoxia: Secondary | ICD-10-CM | POA: Diagnosis not present

## 2017-06-05 ENCOUNTER — Other Ambulatory Visit: Payer: Self-pay | Admitting: Internal Medicine

## 2017-06-09 ENCOUNTER — Other Ambulatory Visit: Payer: Self-pay

## 2017-06-09 MED ORDER — GLUCOSE BLOOD VI STRP: ORAL_STRIP | 12 refills | Status: DC

## 2017-06-15 ENCOUNTER — Encounter: Payer: Self-pay | Admitting: Family Medicine

## 2017-06-15 ENCOUNTER — Ambulatory Visit: Payer: Medicare HMO

## 2017-06-15 ENCOUNTER — Other Ambulatory Visit: Payer: Self-pay

## 2017-06-15 ENCOUNTER — Ambulatory Visit: Payer: Medicare HMO | Admitting: Family Medicine

## 2017-06-15 VITALS — BP 128/60 | HR 60 | Temp 98.1°F | Wt 218.4 lb

## 2017-06-15 DIAGNOSIS — Z1231 Encounter for screening mammogram for malignant neoplasm of breast: Secondary | ICD-10-CM

## 2017-06-15 DIAGNOSIS — I272 Pulmonary hypertension, unspecified: Secondary | ICD-10-CM

## 2017-06-15 DIAGNOSIS — E049 Nontoxic goiter, unspecified: Secondary | ICD-10-CM | POA: Diagnosis not present

## 2017-06-15 DIAGNOSIS — Z794 Long term (current) use of insulin: Secondary | ICD-10-CM

## 2017-06-15 DIAGNOSIS — Z23 Encounter for immunization: Secondary | ICD-10-CM | POA: Diagnosis not present

## 2017-06-15 DIAGNOSIS — I509 Heart failure, unspecified: Secondary | ICD-10-CM | POA: Diagnosis not present

## 2017-06-15 DIAGNOSIS — Z1239 Encounter for other screening for malignant neoplasm of breast: Secondary | ICD-10-CM

## 2017-06-15 DIAGNOSIS — E118 Type 2 diabetes mellitus with unspecified complications: Secondary | ICD-10-CM

## 2017-06-15 NOTE — Assessment & Plan Note (Signed)
Seems to be relatively well controlled at this time.  We will check an A1c today.  Consider we will contact our clinical pharmacist to see about assistance for [name].  Discussed with the patient that she should be out of the donut hole starting in January.

## 2017-06-15 NOTE — Assessment & Plan Note (Signed)
Noted on CT scans more recently.  Will check a TSH.  Consider thyroid ultrasound after TSH returns.

## 2017-06-15 NOTE — Progress Notes (Signed)
[name], MD Phone: [phone]  PALLIE SWIGERT is a 71 y.o. female who presents today for follow-up.  Diabetes: Patient noted sugars improved significantly with the [name].  Down to less than 120.  She has not had the [name] since she has been in the donut hole earlier this month.  Taking metformin and her sugars typically run 145. No polyuria or polydipsia.  Only gets hypoglycemic if she forgets to eat. Typically eats 2 meals a day with breakfast being around 10:30 and dinner around 6:30. She is due to see ophthalmology and she will make the appointment.  Patient previously noted to have a possible thyroid nodule on CT imaging.  Ultrasound from several years ago with multiple nodules.  Unclear if she had further workup.  She has had multiple CT scans since then that have not revealed a nodule though have revealed a goiter.  She does note some dry skin and her hair has been shedding.  Patient has followed with pulmonology.  They advised increasing oxygen on ambulation.  They also increased her Revatio.  She notes that has been quite beneficial.  Breathing is better with this change.  She has been trying to lose weight by decreasing her portion sizes as well.  Social History   Tobacco Use  Smoking Status Former Smoker  . Packs/day: 0.30  . Years: 2.00  . Pack years: 0.60  . Types: Cigarettes  . Last attempt to quit: 06/23/1990  . Years since quitting: 26.9  Smokeless Tobacco Never Used     ROS see history of present illness  Objective  Physical Exam Vitals:   06/15/17 1130  BP: 128/60  Pulse: 60  Temp: 98.1 F (36.7 C)  SpO2: 94%    BP Readings from Last 3 Encounters:  06/15/17 128/60  01/28/17 122/78  12/29/16 (!) 144/84   Wt Readings from Last 3 Encounters:  06/15/17 218 lb 6.4 oz (99.1 kg)  01/28/17 222 lb 6 oz (100.9 kg)  12/29/16 229 lb 9.6 oz (104.1 kg)    Physical Exam  Constitutional: No distress.  Neck: Thyromegaly (Slight enlargement  with no nodules palpated) present.  Cardiovascular: Normal rate, regular rhythm and normal heart sounds.  Pulmonary/Chest: Effort normal and breath sounds normal.  Musculoskeletal: She exhibits no edema.  Neurological: She is alert. Gait normal.  Skin: Skin is warm and dry. She is not diaphoretic.     Assessment/Plan: Please see individual problem list.  Goiter Noted on CT scans more recently.  Will check a TSH.  Consider thyroid ultrasound after TSH returns.  Diabetes ([name]) Seems to be relatively well controlled at this time.  We will check an A1c today.  Consider we will contact our clinical pharmacist to see about assistance for [name].  Discussed with the patient that she should be out of the donut hole starting in January.  Pulmonary hypertension Patient with chronic respiratory failure.  She is following with pulmonology.  Recently started on sildenafil.  She notes improvement with this.  She will continue to follow with them and continue her current regimen.   Kelsee was seen today for follow-up.  Diagnoses and all orders for this visit:  Goiter -     Cancel: TSH -     TSH  Type 2 diabetes mellitus with complication, with long-term current use of insulin (HCC) -     Cancel: Comp Met (CMET) -     Cancel: HgB A1c -     HgB A1c -  Comp Met (CMET)  Breast cancer screening -     MM SCREENING BREAST TOMO BILATERAL; Future  Encounter for immunization -     Flu vaccine HIGH DOSE PF  Pulmonary hypertension ([place])    Orders Placed This Encounter  Procedures  . MM SCREENING BREAST TOMO BILATERAL    Previously had done at [facility] in [place]    Standing Status:   Future    Standing Expiration Date:   08/16/2018    Order Specific Question:   Reason for Exam (SYMPTOM  OR DIAGNOSIS REQUIRED)    Answer:   breast cancer screening    Order Specific Question:   Preferred imaging location?    Answer:   [facility]  . Flu vaccine HIGH DOSE PF  . TSH    . HgB A1c  . Comp Met (CMET)    No orders of the defined types were placed in this encounter.    [name], MD [place]

## 2017-06-15 NOTE — Patient Instructions (Signed)
Nice to see you. We will contact you with your lab results. We will contact Chrys Racer to check on [name] assistance.

## 2017-06-15 NOTE — Assessment & Plan Note (Signed)
Patient with chronic respiratory failure.  She is following with pulmonology.  Recently started on sildenafil.  She notes improvement with this.  She will continue to follow with them and continue her current regimen.

## 2017-06-16 LAB — COMPREHENSIVE METABOLIC PANEL
AG Ratio: 1.1 (calc) (ref 1.0–2.5)
ALT: 19 U/L (ref 6–29)
AST: 31 U/L (ref 10–35)
Albumin: 3.9 g/dL (ref 3.6–5.1)
Alkaline phosphatase (APISO): 85 U/L (ref 33–130)
BUN: 18 mg/dL (ref 7–25)
CO2: 26 mmol/L (ref 20–32)
Calcium: 9 mg/dL (ref 8.6–10.4)
Chloride: 97 mmol/L — ABNORMAL LOW (ref 98–110)
Creat: 0.86 mg/dL (ref 0.60–0.93)
Globulin: 3.5 g/dL (calc) (ref 1.9–3.7)
Glucose, Bld: 202 mg/dL — ABNORMAL HIGH (ref 65–99)
Potassium: 3.5 mmol/L (ref 3.5–5.3)
Sodium: 138 mmol/L (ref 135–146)
Total Bilirubin: 0.5 mg/dL (ref 0.2–1.2)
Total Protein: 7.4 g/dL (ref 6.1–8.1)

## 2017-06-16 LAB — HEMOGLOBIN A1C
Hgb A1c MFr Bld: 5.9 % of total Hgb — ABNORMAL HIGH (ref <5.7)
Mean Plasma Glucose: 123 (calc)
eAG (mmol/L): 6.8 (calc)

## 2017-06-16 LAB — TSH: TSH: 0.51 mIU/L (ref 0.40–4.50)

## 2017-06-17 ENCOUNTER — Telehealth: Payer: Self-pay | Admitting: Pharmacist

## 2017-06-17 NOTE — Telephone Encounter (Signed)
Called patient at request of Dr. [name] to address medication assistance options for [name] as patient is in the donut hole. Unfortunately, patient's application will likely NOT be approved before the end of the year and the donut hole should reset in January. Patient reports cost of [name] is affordable when she is NOT in the donut hole. Educated her that once she meets her deductible, the cost of the medication should return to what it was before.   Patient believes she qualifies for Low Income Subsidy/ PARTIAL Extra help based on income. With patient's permission and assistance over the phone, filled out online application for LIS. Patient should receive letter in the mail with decision on LIS approval. Patient is to call me when she receives that letter. Provided her with my phone number.  If approved for partial extra help, deductible, copays, and insurance premiums should be even more affordable.   Carlean Jews, Pharm.D., BCPS PGY2 Ambulatory Care Pharmacy Resident Phone: [phone]

## 2017-06-18 ENCOUNTER — Other Ambulatory Visit: Payer: Self-pay | Admitting: Family Medicine

## 2017-06-22 ENCOUNTER — Ambulatory Visit: Payer: Medicare HMO | Admitting: Family Medicine

## 2017-06-27 DIAGNOSIS — J9611 Chronic respiratory failure with hypoxia: Secondary | ICD-10-CM | POA: Diagnosis not present

## 2017-07-01 ENCOUNTER — Ambulatory Visit (INDEPENDENT_AMBULATORY_CARE_PROVIDER_SITE_OTHER): Payer: Medicare HMO

## 2017-07-01 VITALS — BP 120/78 | HR 60 | Temp 98.8°F | Resp 16 | Ht 61.0 in | Wt 219.8 lb

## 2017-07-01 DIAGNOSIS — E049 Nontoxic goiter, unspecified: Secondary | ICD-10-CM | POA: Diagnosis not present

## 2017-07-01 DIAGNOSIS — Z1159 Encounter for screening for other viral diseases: Secondary | ICD-10-CM | POA: Diagnosis not present

## 2017-07-01 DIAGNOSIS — Z0000 Encounter for general adult medical examination without abnormal findings: Secondary | ICD-10-CM | POA: Diagnosis not present

## 2017-07-01 DIAGNOSIS — Z1331 Encounter for screening for depression: Secondary | ICD-10-CM

## 2017-07-01 DIAGNOSIS — Z1239 Encounter for other screening for malignant neoplasm of breast: Secondary | ICD-10-CM

## 2017-07-01 NOTE — Patient Instructions (Addendum)
Debra Saunders , Thank you for taking time to come for your Medicare Wellness Visit. I appreciate your ongoing commitment to your health goals. Please review the following plan we discussed and let me know if I can assist you in the future.   Follow up with Dr. [name] as needed.    Bring a copy of your [place] and/or Living Will to be scanned into chart.  Mammogram ordered, follow as directed.  Have a great day!  These are the goals we discussed: Goals    . DIET - INCREASE WATER INTAKE    . Low Carb Foods     Dietary supplement drink instead of skipping meals       This is a list of the screening recommended for you and due dates:  Health Maintenance  Topic Date Due  .  Hepatitis C: One time screening is recommended by Center for Disease Control  (CDC) for  adults born from [birth year] through 1967.   [birth year]  . Mammogram  06/15/2014  . Urine Protein Check  03/18/2016  . Eye exam for diabetics  06/13/2016  . Complete foot exam   06/09/2017  . DEXA scan (bone density measurement)  11/20/2017*  . Hemoglobin A1C  12/14/2017  . Tetanus Vaccine  08/04/2023  . Colon Cancer Screening  11/27/2026  . Flu Shot  Completed  *Topic was postponed. The date shown is not the original due date.    Mammogram A mammogram is an X-ray of the breasts that is done to check for abnormal changes. This procedure can screen for and detect any changes that may suggest breast cancer. A mammogram can also identify other changes and variations in the breast, such as:  Inflammation of the breast tissue (mastitis).  An infected area that contains a collection of pus (abscess).  A fluid-filled sac (cyst).  Fibrocystic changes. This is when breast tissue becomes denser, which can make the tissue feel rope-like or uneven under the skin.  Tumors that are not cancerous (benign).  Tell a health care provider about:  Any allergies you have.  If you have breast implants.  If you have  had previous breast disease, biopsy, or surgery.  If you are breastfeeding.  Any possibility that you could be pregnant, if this applies.  If you are younger than age 18.  If you have a family history of breast cancer. What are the risks? Generally, this is a safe procedure. However, problems may occur, including:  Exposure to radiation. Radiation levels are very low with this test.  The results being misinterpreted.  The need for further tests.  The inability of the mammogram to detect certain cancers.  What happens before the procedure?  Schedule your test about 1-2 weeks after your menstrual period. This is usually when your breasts are the least tender.  If you have had a mammogram done at a different facility in the past, get the mammogram X-rays or have them sent to your current exam facility in order to compare them.  Wash your breasts and under your arms the day of the test.  Do not wear deodorants, perfumes, lotions, or powders anywhere on your body on the day of the test.  Remove any jewelry from your neck.  Wear clothes that you can change into and out of easily. What happens during the procedure?  You will undress from the waist up and put on a gown.  You will stand in front of the X-ray machine.  Each breast will be placed between two plastic or glass plates. The plates will compress your breast for a few seconds. Try to stay as relaxed as possible during the procedure. This does not cause any harm to your breasts and any discomfort you feel will be very brief.  X-rays will be taken from different angles of each breast. The procedure may vary among health care providers and hospitals. What happens after the procedure?  The mammogram will be examined by a specialist (radiologist).  You may need to repeat certain parts of the test, depending on the quality of the images. This is commonly done if the radiologist needs a better view of the breast tissue.  Ask  when your test results will be ready. Make sure you get your test results.  You may resume your normal activities. This information is not intended to replace advice given to you by your health care provider. Make sure you discuss any questions you have with your health care provider. Document Released: 06/06/2000 Document Revised: 11/12/2015 Document Reviewed: 08/18/2014 Elsevier Interactive Patient Education  [employer].

## 2017-07-01 NOTE — Progress Notes (Signed)
Subjective:   Debra Saunders is a 72 y.o. female who presents for Medicare Annual (Subsequent) preventive examination.  Review of Systems:  No ROS.  Medicare Wellness Visit. Additional risk factors are reflected in the social history.  Cardiac Risk Factors include: advanced age (>25mn, >>18women);diabetes mellitus;hypertension;obesity (BMI >30kg/m2)     Objective:     Vitals: BP 120/78 (BP Location: Left Arm, Patient Position: Sitting, Cuff Size: Normal)   Pulse 60   Temp 98.8 F (37.1 C)   Resp 16   Ht '5\' 1"'  (1.549 m)   Wt 219 lb 12.8 oz (99.7 kg)   SpO2 96%   BMI 41.53 kg/m   Body mass index is 41.53 kg/m.  Advanced Directives 07/01/2017 07/22/2016 07/09/2015 06/28/2015 06/28/2015 05/31/2015 05/01/2015  Does Patient Have a Medical Advance Directive? Yes Yes Yes Yes Yes Yes No  Type of AParamedicof APiersonLiving will Healthcare Power of AHomerLiving will - HSpencerLiving will - -  Does patient want to make changes to medical advance directive? No - Patient declined - - - No - Patient declined - -  Copy of HCheyennein Chart? No - copy requested - - - No - copy requested No - copy requested -  Would patient like information on creating a medical advance directive? - - - - - - Yes - Educational materials given  Pre-existing out of facility DNR order (yellow form or pink MOST form) - - - - - - -    Tobacco Social History   Tobacco Use  Smoking Status Former Smoker  . Packs/day: 0.30  . Years: 2.00  . Pack years: 0.60  . Types: Cigarettes  . Last attempt to quit: 06/23/1990  . Years since quitting: 27.0  Smokeless Tobacco Never Used     Counseling given: Not Answered   Clinical Intake:  Pre-visit preparation completed: Yes  Pain : No/denies pain     Nutritional Status: BMI > 30  Obese Diabetes: Yes  How often do you need to have someone help you when you read  instructions, pamphlets, or other written materials from your doctor or pharmacy?: 1 - Never  Interpreter Needed?: No     Past Medical History:  Diagnosis Date  . Acute renal failure ([place]   . Arthritis   . Chest pain    a. 01/2012 Cath: nonobs dzs;  c. 04/2014 MV: No ischemia.  . Chronic back pain   . Chronic diastolic CHF (congestive heart failure) ([place]    a. Dx 2005 @ [facility];  b. 02/2014 Echo: Ef 55-60%, no rwma, mild MR, PASP 34mg.  . Marland Kitchenhronic respiratory failure (HCC)    a. on home O2  . COPD (chronic obstructive pulmonary disease) ([facility]   a. Home O2 - 3lpm  . Gastritis   . History of intracranial hemorrhage    a. 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  . Marland Kitchenyperlipidemia   . Hypertension   . Lipoma of back   . Lymphedema    a. Chronic LLE edema.  . Obesity   . Sepsis with metabolic encephalopathy ([place]  . Sleep apnea   . Streptococcal bacteremia   . Stroke ([facility]  . Symptomatic bradycardia    a. s/p BSX PPM.  . Thyroid disorder   . Thyroid nodule   . Type II diabetes mellitus ([place]  . Urinary incontinence    Past Surgical  History:  Procedure Laterality Date  . ABDOMINAL HYSTERECTOMY  1969  . CARDIAC CATHETERIZATION  2013   @ [facility]: No obstructive CAD: Only 20% ostial left circumflex.  . CHOLECYSTECTOMY    . CYST REMOVAL TRUNK  2002   BACK  . INSERT / REPLACE / REMOVE PACEMAKER    . lipoma removal  2014   back  . PACEMAKER INSERTION  [address] [employer] dual chamber pacemaker implanted by Dr Caryl Comes for symptomatic bradycardia  . PERMANENT PACEMAKER INSERTION N/A 01/09/2014   Procedure: PERMANENT PACEMAKER INSERTION;  Surgeon: Deboraha Sprang, MD;  Location: [facility] CATH LAB;  Service: Cardiovascular;  Laterality: N/A;  . TRIGGER FINGER RELEASE    . VAGINAL HYSTERECTOMY     Family History  Problem Relation Age of Onset  . Heart disease Mother   . Hypertension Mother   . COPD Mother        was a smoker  . Thyroid disease Mother   .  Hypertension Father   . Hypertension Sister   . Thyroid disease Sister   . Hypertension Sister   . Thyroid disease Sister   . Breast cancer Maternal Aunt   . Kidney disease Neg Hx   . Bladder Cancer Neg Hx    Social History   Socioeconomic History  . Marital status: Divorced    Spouse name: None  . Number of children: None  . Years of education: None  . Highest education level: None  Social Needs  . Financial resource strain: None  . Food insecurity - worry: None  . Food insecurity - inability: None  . Transportation needs - medical: None  . Transportation needs - non-medical: None  Occupational History  . Occupation: Retired- factory work    Comment: exposed to Pharmacist, community  Tobacco Use  . Smoking status: Former Smoker    Packs/day: 0.30    Years: 2.00    Pack years: 0.60    Types: Cigarettes    Last attempt to quit: 06/23/1990    Years since quitting: 27.0  . Smokeless tobacco: Never Used  Substance and Sexual Activity  . Alcohol use: No  . Drug use: No  . Sexual activity: None  Other Topics Concern  . None  Social History Narrative   Lives in [place] with daughter. Has 4 children. No pets.      Work - Restaurant manager, fast food, retired      Diet - regular    Outpatient Encounter Medications as of 07/01/2017  Medication Sig  . acetaminophen (TYLENOL) 325 MG tablet Take 325-650 mg by mouth every 6 (six) hours as needed for mild pain. Reported on 10/10/2015  . albuterol (PROVENTIL) (2.5 MG/3ML) 0.083% nebulizer solution Take 3 mLs (2.5 mg total) by nebulization every 6 (six) hours as needed for wheezing or shortness of breath.  . Blood Glucose Monitoring Suppl (TRUE METRIX AIR GLUCOSE METER) w/Device KIT USE AS DIRECTED  . CARTIA XT 240 MG 24 hr capsule TAKE ONE CAPSULE BY MOUTH ONCE DAILY  . cyclobenzaprine (FLEXERIL) 5 MG tablet Take 1 tablet (5 mg total) by mouth 3 (three) times daily as needed for muscle spasms.  . Dulaglutide ([name]) 5.99 JT/7.0VX SOPN Inject 0.75 mg  into the skin once a week.  [name] EASY TOUCH INSULIN SYRINGE 29G X 1/2" 1 ML MISC USE AS DIRECTED WITH  LEVEMIR  INSULIN  . gabapentin (NEURONTIN) 100 MG capsule TAKE 2 CAPSULES BY MOUTH THREE TIMES DAILY  . glucose blood test strip Use as  instructed to check blood glucose three times a day. e11.8  . hydrochlorothiazide (HYDRODIURIL) 25 MG tablet TAKE 1/2 (ONE-HALF) TABLET BY MOUTH ONCE DAILY  . KLOR-CON M20 20 MEQ tablet TAKE 1 TABLET BY MOUTH TWICE DAILY  . Lancets MISC Use as directed to check blood sugar three times daily e11.8  . meclizine (ANTIVERT) 25 MG tablet Take 25 mg by mouth daily as needed for dizziness. Reported on 10/10/2015  . Menthol, Topical Analgesic, (BIOFREEZE EX) Apply 1 application topically as needed (for pain).   . metFORMIN (GLUCOPHAGE) 500 MG tablet TAKE 2 TABLETS BY MOUTH TWICE DAILY WITH A MEAL  . ondansetron (ZOFRAN) 8 MG tablet Take 1 tablet (8 mg total) by mouth every 8 (eight) hours as needed for nausea or vomiting.  [name] oxyCODONE-acetaminophen (PERCOCET/ROXICET) 5-325 MG tablet Take 1 tablet by mouth every 8 (eight) hours as needed for severe pain.  . OXYGEN Inhale into the lungs. 3 liters  . polyethylene glycol (MIRALAX / GLYCOLAX) packet Take 17 g by mouth daily as needed for moderate constipation.  . promethazine (PHENERGAN) 25 MG tablet Take 25 mg by mouth every 4 (four) hours as needed for nausea or vomiting. Reported on 10/10/2015  . rosuvastatin (CRESTOR) 10 MG tablet Take 1 tablet (10 mg total) by mouth daily.  . sildenafil (REVATIO) 20 MG tablet Take 20 mg by mouth 3 (three) times daily.  [name] torsemide (DEMADEX) 20 MG tablet Take 1 tablet (20 mg total) by mouth 2 (two) times daily. (Patient taking differently: Take 20 mg by mouth daily as needed. )  . losartan (COZAAR) 50 MG tablet Take 1 tablet (50 mg total) by mouth daily.  . metoprolol tartrate (LOPRESSOR) 50 MG tablet Take 1 tablet (50 mg total) by mouth 2 (two) times daily.   No facility-administered  encounter medications on file as of 07/01/2017.     Activities of Daily Living In your present state of health, do you have any difficulty performing the following activities: 07/01/2017  Hearing? N  Vision? N  Difficulty concentrating or making decisions? N  Walking or climbing stairs? Y  Dressing or bathing? Y  Doing errands, shopping? Y  Preparing Food and eating ? N  Using the Toilet? N  In the past six months, have you accidently leaked urine? N  Do you have problems with loss of bowel control? N  Managing your Medications? N  Managing your Finances? N  Housekeeping or managing your Housekeeping? N  Some recent data might be hidden    Patient Care Team: [name], MD as PCP - General (Family Medicine) Thom Chimes, MD as Referring Physician (Allergy) Juanito Doom, MD as Referring Physician (Internal Medicine)    Assessment:   This is a routine wellness examination for Ameila. The goal of the wellness visit is to assist the patient how to close the gaps in care and create a preventative care plan for the patient.   The roster of all physicians providing medical care to patient is listed in the Snapshot section of the chart.  Taking calcium VIT D as appropriate/Osteoporosis risk reviewed.    Safety issues reviewed; Smoke and carbon monoxide detectors in the home. No firearms in the home.  Wears seatbelts when riding with others. Patient does wear sunscreen or protective clothing when in direct sunlight. No violence in the home.  Depression- PHQ 2 &9 complete.  No signs/symptoms or verbal communication regarding little pleasure in doing things, feeling down, depressed or hopeless. No changes in  sleeping, energy, eating, concentrating.  No thoughts of self harm or harm towards others.  Time spent on this topic is 8 minutes.   Patient is alert, normal appearance, oriented to person/place/and time.  Correctly identified the president of the [place], recall of 2/3  words, and performing simple calculations. Displays appropriate judgement and can read correct time from watch face.   No new identified risk were noted.  No failures at ADL's or IADL's.  Ambulates with roll aid walker.  BMI- discussed the importance of a healthy diet, water intake and the benefits of aerobic exercise. Educational material provided. She plans to increase her water intake.   24 hour diet recall: Low carb   Daily fluid intake: 1 cups of caffeine, 1 cups of water  Eye- Visual acuity not assessed per patient preference since they have regular follow up with the ophthalmologist.  Wears corrective lenses.  Sleep patterns- Sleeps 8-10 hours at night. Wakes feeling rested. CPAP with oxygen set at 4L when sleeping.     Oxygen in use during the day at 3L, nasal cannula.   Hepatitis C Screening completed.  Mammogram ordered; follow as directed.  Educational material provided.  Patient Concerns:  Discussed lab results.  She verbalizes understanding and requests proceeding ultrasound of her thyroid to evaluate for any nodules.  Exercise Activities and Dietary recommendations Current Exercise Habits: Home exercise routine, Type of exercise: walking, Time (Minutes): 15, Frequency (Times/Week): 2, Weekly Exercise (Minutes/Week): 30, Intensity: Mild  Goals    . DIET - INCREASE WATER INTAKE    . Low Carb Foods     Dietary supplement drink instead of skipping meals       Fall Risk Fall Risk  07/01/2017 10/21/2016 06/09/2016 05/06/2016 01/31/2016  Falls in the past year? No No No No No  Risk for fall due to : - - - Impaired balance/gait;Impaired mobility Impaired balance/gait;Impaired mobility   Depression Screen PHQ 2/9 Scores 07/01/2017 10/21/2016 10/21/2016 05/06/2016  PHQ - 2 Score 0 0 0 0  PHQ- 9 Score 0 5 - -     Cognitive Function     6CIT Screen 07/01/2017  What Year? 0 points  What month? 0 points  What time? 0 points  Count back from 20 0 points  Months in reverse 0  points  Repeat phrase 0 points  Total Score 0    Immunization History  Administered Date(s) Administered  . Influenza, High Dose Seasonal PF 07/23/2016, 06/15/2017  . Influenza, Seasonal, Injecte, Preservative Fre 07/27/2012  . Influenza,inj,Quad PF,6+ Mos 06/10/2013, 05/01/2014, 03/19/2015  . Pneumococcal Conjugate-13 10/06/2013  . Pneumococcal Polysaccharide-23 04/28/2012  . Td 08/03/2013    Screening Tests Health Maintenance  Topic Date Due  . Hepatitis C Screening  [birth year]  . MAMMOGRAM  06/15/2014  . URINE MICROALBUMIN  03/18/2016  . OPHTHALMOLOGY EXAM  06/13/2016  . FOOT EXAM  06/09/2017  . DEXA SCAN  11/20/2017 (Originally 07/01/2008)  . HEMOGLOBIN A1C  12/14/2017  . TETANUS/TDAP  08/04/2023  . COLONOSCOPY  11/27/2026  . INFLUENZA VACCINE  Completed        Plan:  End of life planning; Advance aging; Advanced directives discussed. Copy of current HCPOA/Living Will requested.    I have personally reviewed and noted the following in the patient's chart:   . Medical and social history . Use of alcohol, tobacco or illicit drugs  . Current medications and supplements . Functional ability and status . Nutritional status . Physical activity . Advanced directives . List of  other physicians . Hospitalizations, surgeries, and ER visits in previous 12 months . Vitals . Screenings to include cognitive, depression, and falls . Referrals and appointments  In addition, I have reviewed and discussed with patient certain preventive protocols, quality metrics, and best practice recommendations. A written personalized care plan for preventive services as well as general preventive health recommendations were provided to patient.     Varney Biles, LPN  01/21/1885

## 2017-07-02 LAB — HEPATITIS C ANTIBODY
Hepatitis C Ab: NONREACTIVE
SIGNAL TO CUT-OFF: 0.03 (ref <1.00)

## 2017-07-04 NOTE — Addendum Note (Signed)
Addended by: [name] Shawnn Bouillon G on: 07/04/2017 11:56 AM   Modules accepted: Orders

## 2017-07-12 ENCOUNTER — Other Ambulatory Visit: Payer: Self-pay | Admitting: Internal Medicine

## 2017-07-13 ENCOUNTER — Telehealth: Payer: Self-pay | Admitting: Pharmacist

## 2017-07-13 NOTE — Telephone Encounter (Signed)
Patient received notification of LIS application decision. Unfortunately, patient was denied. Patient was instructed to keep that letter in her files for the future in case we need to apply for patient assistance programs again later in the year. Patient reports [name] was affordable and that she picked it up yesterday. Has not had any hypoglycemic episodes.   Patient's A1C greatly improved to 5.9. Continued glycemic control. Patient OK to follow up with Dr. [name]. Will happily see this patient again if glycemic control or affordability becomes an issue in the future.   Carlean Jews, Pharm.D., BCPS PGY2 Ambulatory Care Pharmacy Resident Phone: [phone]

## 2017-07-13 NOTE — Telephone Encounter (Signed)
Ok to refill # 30 w/ no refills.

## 2017-07-13 NOTE — Telephone Encounter (Signed)
Please review for refill. Thanks!  

## 2017-07-16 ENCOUNTER — Ambulatory Visit: Payer: Medicare HMO

## 2017-07-16 DIAGNOSIS — I509 Heart failure, unspecified: Secondary | ICD-10-CM | POA: Diagnosis not present

## 2017-07-28 DIAGNOSIS — J9611 Chronic respiratory failure with hypoxia: Secondary | ICD-10-CM | POA: Diagnosis not present

## 2017-08-04 ENCOUNTER — Ambulatory Visit
Admission: RE | Admit: 2017-08-04 | Discharge: 2017-08-04 | Disposition: A | Discharge status: Home / Self Care | Payer: Medicare HMO | Source: Ambulatory Visit | Attending: Family Medicine | Admitting: Family Medicine

## 2017-08-04 ENCOUNTER — Other Ambulatory Visit
Admission: RE | Admit: 2017-08-04 | Discharge: 2017-08-04 | Disposition: A | Discharge status: Home / Self Care | Payer: Medicare HMO | Source: Ambulatory Visit | Attending: Specialist | Admitting: Specialist

## 2017-08-04 ENCOUNTER — Other Ambulatory Visit: Payer: Self-pay | Admitting: Family Medicine

## 2017-08-04 DIAGNOSIS — E042 Nontoxic multinodular goiter: Secondary | ICD-10-CM | POA: Insufficient documentation

## 2017-08-04 DIAGNOSIS — Z6841 Body Mass Index (BMI) 40.0 and over, adult: Secondary | ICD-10-CM | POA: Diagnosis not present

## 2017-08-04 DIAGNOSIS — I272 Pulmonary hypertension, unspecified: Secondary | ICD-10-CM | POA: Insufficient documentation

## 2017-08-04 DIAGNOSIS — R0609 Other forms of dyspnea: Secondary | ICD-10-CM | POA: Diagnosis not present

## 2017-08-04 DIAGNOSIS — E049 Nontoxic goiter, unspecified: Secondary | ICD-10-CM

## 2017-08-04 LAB — BRAIN NATRIURETIC PEPTIDE: B Natriuretic Peptide: 175 pg/mL — ABNORMAL HIGH (ref 0.0–100.0)

## 2017-08-10 ENCOUNTER — Other Ambulatory Visit: Payer: Self-pay | Admitting: Family Medicine

## 2017-08-10 ENCOUNTER — Ambulatory Visit: Payer: Medicare HMO

## 2017-08-16 ENCOUNTER — Encounter: Payer: Self-pay | Admitting: Emergency Medicine

## 2017-08-16 ENCOUNTER — Emergency Department: Payer: Medicare HMO

## 2017-08-16 ENCOUNTER — Inpatient Hospital Stay
Admission: EM | Admit: 2017-08-16 | Discharge: 2017-08-28 | DRG: 308 | Disposition: A | Discharge status: Skilled Nursing Facility | Payer: Medicare HMO | Attending: Internal Medicine | Admitting: Internal Medicine

## 2017-08-16 ENCOUNTER — Other Ambulatory Visit: Payer: Self-pay

## 2017-08-16 DIAGNOSIS — J9600 Acute respiratory failure, unspecified whether with hypoxia or hypercapnia: Secondary | ICD-10-CM

## 2017-08-16 DIAGNOSIS — J9622 Acute and chronic respiratory failure with hypercapnia: Secondary | ICD-10-CM | POA: Diagnosis present

## 2017-08-16 DIAGNOSIS — Z886 Allergy status to analgesic agent status: Secondary | ICD-10-CM

## 2017-08-16 DIAGNOSIS — I2510 Atherosclerotic heart disease of native coronary artery without angina pectoris: Secondary | ICD-10-CM | POA: Diagnosis present

## 2017-08-16 DIAGNOSIS — I509 Heart failure, unspecified: Secondary | ICD-10-CM

## 2017-08-16 DIAGNOSIS — J441 Chronic obstructive pulmonary disease with (acute) exacerbation: Secondary | ICD-10-CM | POA: Diagnosis not present

## 2017-08-16 DIAGNOSIS — I10 Essential (primary) hypertension: Secondary | ICD-10-CM | POA: Diagnosis not present

## 2017-08-16 DIAGNOSIS — E785 Hyperlipidemia, unspecified: Secondary | ICD-10-CM | POA: Diagnosis not present

## 2017-08-16 DIAGNOSIS — E875 Hyperkalemia: Secondary | ICD-10-CM | POA: Diagnosis not present

## 2017-08-16 DIAGNOSIS — R079 Chest pain, unspecified: Secondary | ICD-10-CM | POA: Diagnosis not present

## 2017-08-16 DIAGNOSIS — G4733 Obstructive sleep apnea (adult) (pediatric): Secondary | ICD-10-CM | POA: Diagnosis present

## 2017-08-16 DIAGNOSIS — I5021 Acute systolic (congestive) heart failure: Secondary | ICD-10-CM | POA: Diagnosis not present

## 2017-08-16 DIAGNOSIS — K5900 Constipation, unspecified: Secondary | ICD-10-CM | POA: Diagnosis present

## 2017-08-16 DIAGNOSIS — I4891 Unspecified atrial fibrillation: Secondary | ICD-10-CM | POA: Diagnosis not present

## 2017-08-16 DIAGNOSIS — N289 Disorder of kidney and ureter, unspecified: Secondary | ICD-10-CM | POA: Diagnosis present

## 2017-08-16 DIAGNOSIS — I5033 Acute on chronic diastolic (congestive) heart failure: Secondary | ICD-10-CM | POA: Diagnosis not present

## 2017-08-16 DIAGNOSIS — I481 Persistent atrial fibrillation: Secondary | ICD-10-CM | POA: Diagnosis not present

## 2017-08-16 DIAGNOSIS — Z7984 Long term (current) use of oral hypoglycemic drugs: Secondary | ICD-10-CM | POA: Diagnosis not present

## 2017-08-16 DIAGNOSIS — R05 Cough: Secondary | ICD-10-CM | POA: Diagnosis not present

## 2017-08-16 DIAGNOSIS — M5010 Cervical disc disorder with radiculopathy, unspecified cervical region: Secondary | ICD-10-CM | POA: Diagnosis not present

## 2017-08-16 DIAGNOSIS — I420 Dilated cardiomyopathy: Secondary | ICD-10-CM | POA: Diagnosis not present

## 2017-08-16 DIAGNOSIS — I5043 Acute on chronic combined systolic (congestive) and diastolic (congestive) heart failure: Secondary | ICD-10-CM | POA: Diagnosis present

## 2017-08-16 DIAGNOSIS — J449 Chronic obstructive pulmonary disease, unspecified: Secondary | ICD-10-CM | POA: Diagnosis not present

## 2017-08-16 DIAGNOSIS — Z6841 Body Mass Index (BMI) 40.0 and over, adult: Secondary | ICD-10-CM | POA: Diagnosis not present

## 2017-08-16 DIAGNOSIS — E119 Type 2 diabetes mellitus without complications: Secondary | ICD-10-CM | POA: Diagnosis not present

## 2017-08-16 DIAGNOSIS — Z880 Allergy status to penicillin: Secondary | ICD-10-CM

## 2017-08-16 DIAGNOSIS — I5030 Unspecified diastolic (congestive) heart failure: Secondary | ICD-10-CM | POA: Diagnosis not present

## 2017-08-16 DIAGNOSIS — I110 Hypertensive heart disease with heart failure: Secondary | ICD-10-CM | POA: Diagnosis not present

## 2017-08-16 DIAGNOSIS — R0602 Shortness of breath: Secondary | ICD-10-CM | POA: Diagnosis not present

## 2017-08-16 DIAGNOSIS — G40909 Epilepsy, unspecified, not intractable, without status epilepticus: Secondary | ICD-10-CM | POA: Diagnosis present

## 2017-08-16 DIAGNOSIS — N390 Urinary tract infection, site not specified: Secondary | ICD-10-CM | POA: Diagnosis not present

## 2017-08-16 DIAGNOSIS — R059 Cough, unspecified: Secondary | ICD-10-CM

## 2017-08-16 DIAGNOSIS — E1169 Type 2 diabetes mellitus with other specified complication: Secondary | ICD-10-CM | POA: Diagnosis not present

## 2017-08-16 DIAGNOSIS — Z950 Presence of cardiac pacemaker: Secondary | ICD-10-CM | POA: Diagnosis not present

## 2017-08-16 DIAGNOSIS — J9601 Acute respiratory failure with hypoxia: Secondary | ICD-10-CM | POA: Diagnosis present

## 2017-08-16 DIAGNOSIS — E039 Hypothyroidism, unspecified: Secondary | ICD-10-CM | POA: Diagnosis not present

## 2017-08-16 DIAGNOSIS — Z8249 Family history of ischemic heart disease and other diseases of the circulatory system: Secondary | ICD-10-CM

## 2017-08-16 DIAGNOSIS — J9602 Acute respiratory failure with hypercapnia: Secondary | ICD-10-CM

## 2017-08-16 DIAGNOSIS — J9611 Chronic respiratory failure with hypoxia: Secondary | ICD-10-CM | POA: Diagnosis not present

## 2017-08-16 DIAGNOSIS — I429 Cardiomyopathy, unspecified: Secondary | ICD-10-CM | POA: Diagnosis present

## 2017-08-16 DIAGNOSIS — I272 Pulmonary hypertension, unspecified: Secondary | ICD-10-CM | POA: Diagnosis not present

## 2017-08-16 DIAGNOSIS — Z5189 Encounter for other specified aftercare: Secondary | ICD-10-CM | POA: Diagnosis not present

## 2017-08-16 DIAGNOSIS — Z7401 Bed confinement status: Secondary | ICD-10-CM | POA: Diagnosis not present

## 2017-08-16 DIAGNOSIS — Z87891 Personal history of nicotine dependence: Secondary | ICD-10-CM

## 2017-08-16 DIAGNOSIS — I200 Unstable angina: Secondary | ICD-10-CM | POA: Diagnosis not present

## 2017-08-16 DIAGNOSIS — Z825 Family history of asthma and other chronic lower respiratory diseases: Secondary | ICD-10-CM

## 2017-08-16 DIAGNOSIS — Z86718 Personal history of other venous thrombosis and embolism: Secondary | ICD-10-CM

## 2017-08-16 DIAGNOSIS — I480 Paroxysmal atrial fibrillation: Secondary | ICD-10-CM | POA: Diagnosis not present

## 2017-08-16 DIAGNOSIS — Z9981 Dependence on supplemental oxygen: Secondary | ICD-10-CM

## 2017-08-16 DIAGNOSIS — J9621 Acute and chronic respiratory failure with hypoxia: Secondary | ICD-10-CM | POA: Diagnosis not present

## 2017-08-16 DIAGNOSIS — Z8349 Family history of other endocrine, nutritional and metabolic diseases: Secondary | ICD-10-CM

## 2017-08-16 DIAGNOSIS — M6281 Muscle weakness (generalized): Secondary | ICD-10-CM | POA: Diagnosis not present

## 2017-08-16 DIAGNOSIS — R531 Weakness: Secondary | ICD-10-CM | POA: Diagnosis not present

## 2017-08-16 DIAGNOSIS — E876 Hypokalemia: Secondary | ICD-10-CM | POA: Diagnosis not present

## 2017-08-16 HISTORY — DX: Other persistent atrial fibrillation: I48.19

## 2017-08-16 LAB — BASIC METABOLIC PANEL
Anion gap: 13 (ref 5–15)
BUN: 45 mg/dL — ABNORMAL HIGH (ref 6–20)
CO2: 23 mmol/L (ref 22–32)
Calcium: 9.3 mg/dL (ref 8.9–10.3)
Chloride: 103 mmol/L (ref 101–111)
Creatinine, Ser: 1.36 mg/dL — ABNORMAL HIGH (ref 0.44–1.00)
GFR calc Af Amer: 43 mL/min — ABNORMAL LOW (ref >60)
GFR calc non Af Amer: 37 mL/min — ABNORMAL LOW (ref >60)
Glucose, Bld: 136 mg/dL — ABNORMAL HIGH (ref 65–99)
Potassium: 3.7 mmol/L (ref 3.5–5.1)
Sodium: 139 mmol/L (ref 135–145)

## 2017-08-16 LAB — GLUCOSE, CAPILLARY
Glucose-Capillary: 118 mg/dL — ABNORMAL HIGH (ref 65–99)
Glucose-Capillary: 154 mg/dL — ABNORMAL HIGH (ref 65–99)
Glucose-Capillary: 215 mg/dL — ABNORMAL HIGH (ref 65–99)

## 2017-08-16 LAB — HEPATIC FUNCTION PANEL
ALT: 21 U/L (ref 14–54)
AST: 25 U/L (ref 15–41)
Albumin: 3.8 g/dL (ref 3.5–5.0)
Alkaline Phosphatase: 80 U/L (ref 38–126)
Bilirubin, Direct: 0.1 mg/dL (ref 0.1–0.5)
Indirect Bilirubin: 0.6 mg/dL (ref 0.3–0.9)
Total Bilirubin: 0.7 mg/dL (ref 0.3–1.2)
Total Protein: 8 g/dL (ref 6.5–8.1)

## 2017-08-16 LAB — BRAIN NATRIURETIC PEPTIDE: B Natriuretic Peptide: 655 pg/mL — ABNORMAL HIGH (ref 0.0–100.0)

## 2017-08-16 LAB — CBC
HCT: 37.3 % (ref 35.0–47.0)
Hemoglobin: 11.8 g/dL — ABNORMAL LOW (ref 12.0–16.0)
MCH: 26.2 pg (ref 26.0–34.0)
MCHC: 31.6 g/dL — ABNORMAL LOW (ref 32.0–36.0)
MCV: 82.9 fL (ref 80.0–100.0)
Platelets: 230 10*3/uL (ref 150–440)
RBC: 4.5 MIL/uL (ref 3.80–5.20)
RDW: 14.5 % (ref 11.5–14.5)
WBC: 8.6 10*3/uL (ref 3.6–11.0)

## 2017-08-16 LAB — TROPONIN I
Troponin I: <0.03 ng/mL (ref <0.03)
Troponin I: <0.03 ng/mL (ref <0.03)
Troponin I: <0.03 ng/mL (ref <0.03)

## 2017-08-16 LAB — MRSA PCR SCREENING: MRSA by PCR: POSITIVE — AB

## 2017-08-16 LAB — TSH: TSH: 1.17 u[IU]/mL (ref 0.350–4.500)

## 2017-08-16 LAB — LIPASE, BLOOD
Lipase: 28 U/L (ref 11–51)
Lipase: 27 U/L (ref 11–51)

## 2017-08-16 MED ORDER — MORPHINE SULFATE (PF) 2 MG/ML IV SOLN
2.0000 mg | INTRAVENOUS | Status: DC | PRN
  Administered 2017-08-16: 2 mg via INTRAVENOUS
  Filled 2017-08-16: qty 1

## 2017-08-16 MED ORDER — GABAPENTIN 100 MG PO CAPS
200.0000 mg | ORAL_CAPSULE | Freq: Three times a day (TID) | ORAL | Status: DC
  Administered 2017-08-16 – 2017-08-28 (×37): 200 mg via ORAL
  Filled 2017-08-16 (×37): qty 2

## 2017-08-16 MED ORDER — MUPIROCIN 2 % EX OINT
1.0000 "application " | TOPICAL_OINTMENT | Freq: Two times a day (BID) | CUTANEOUS | Status: AC
  Administered 2017-08-16 – 2017-08-21 (×9): 1 via NASAL
  Filled 2017-08-16 (×2): qty 22

## 2017-08-16 MED ORDER — AMIODARONE HCL IN DEXTROSE 360-4.14 MG/200ML-% IV SOLN
30.0000 mg/h | INTRAVENOUS | Status: DC
  Administered 2017-08-16 – 2017-08-17 (×2): 30 mg/h via INTRAVENOUS
  Filled 2017-08-16: qty 200

## 2017-08-16 MED ORDER — SILDENAFIL CITRATE 20 MG PO TABS
20.0000 mg | ORAL_TABLET | Freq: Three times a day (TID) | ORAL | Status: DC
  Administered 2017-08-16 – 2017-08-28 (×36): 20 mg via ORAL
  Filled 2017-08-16 (×41): qty 1

## 2017-08-16 MED ORDER — INSULIN ASPART 100 UNIT/ML ~~LOC~~ SOLN
0.0000 [IU] | Freq: Three times a day (TID) | SUBCUTANEOUS | Status: DC
  Administered 2017-08-16: 4 [IU] via SUBCUTANEOUS
  Administered 2017-08-17 (×2): 3 [IU] via SUBCUTANEOUS
  Administered 2017-08-17: 4 [IU] via SUBCUTANEOUS
  Administered 2017-08-18 – 2017-08-22 (×8): 3 [IU] via SUBCUTANEOUS
  Administered 2017-08-23 – 2017-08-24 (×2): 4 [IU] via SUBCUTANEOUS
  Administered 2017-08-25: 3 [IU] via SUBCUTANEOUS
  Administered 2017-08-25: 2 [IU] via SUBCUTANEOUS
  Administered 2017-08-26: 7 [IU] via SUBCUTANEOUS
  Administered 2017-08-26 (×2): 3 [IU] via SUBCUTANEOUS
  Administered 2017-08-27 (×2): 7 [IU] via SUBCUTANEOUS
  Administered 2017-08-27: 4 [IU] via SUBCUTANEOUS
  Administered 2017-08-28 (×2): 7 [IU] via SUBCUTANEOUS
  Administered 2017-08-28: 11 [IU] via SUBCUTANEOUS
  Filled 2017-08-16 (×24): qty 1

## 2017-08-16 MED ORDER — DILTIAZEM LOAD VIA INFUSION
20.0000 mg | Freq: Once | INTRAVENOUS | Status: AC
  Administered 2017-08-16: 20 mg via INTRAVENOUS
  Filled 2017-08-16: qty 20

## 2017-08-16 MED ORDER — TORSEMIDE 20 MG PO TABS: 20.0000 mg | ORAL_TABLET | Freq: Every day | ORAL | Status: DC | PRN

## 2017-08-16 MED ORDER — METHYLPREDNISOLONE SODIUM SUCC 125 MG IJ SOLR
125.0000 mg | Freq: Once | INTRAMUSCULAR | Status: AC
  Administered 2017-08-16: 125 mg via INTRAVENOUS
  Filled 2017-08-16: qty 2

## 2017-08-16 MED ORDER — CHLORHEXIDINE GLUCONATE CLOTH 2 % EX PADS
6.0000 | MEDICATED_PAD | Freq: Every day | CUTANEOUS | Status: AC
  Administered 2017-08-17 – 2017-08-20 (×4): 6 via TOPICAL

## 2017-08-16 MED ORDER — NITROGLYCERIN 0.4 MG SL SUBL: 0.4000 mg | SUBLINGUAL_TABLET | SUBLINGUAL | Status: DC | PRN

## 2017-08-16 MED ORDER — DILTIAZEM HCL 25 MG/5ML IV SOLN
INTRAVENOUS | Status: AC
  Filled 2017-08-16: qty 5

## 2017-08-16 MED ORDER — ROSUVASTATIN CALCIUM 10 MG PO TABS
10.0000 mg | ORAL_TABLET | Freq: Every day | ORAL | Status: DC
  Administered 2017-08-16 – 2017-08-28 (×13): 10 mg via ORAL
  Filled 2017-08-16 (×14): qty 1

## 2017-08-16 MED ORDER — ONDANSETRON HCL 4 MG/2ML IJ SOLN
4.0000 mg | Freq: Four times a day (QID) | INTRAMUSCULAR | Status: DC | PRN
  Administered 2017-08-19 – 2017-08-20 (×2): 4 mg via INTRAVENOUS
  Filled 2017-08-16 (×2): qty 2

## 2017-08-16 MED ORDER — ASPIRIN 300 MG RE SUPP: 300.0000 mg | RECTAL | Status: AC

## 2017-08-16 MED ORDER — AMIODARONE HCL IN DEXTROSE 360-4.14 MG/200ML-% IV SOLN
60.0000 mg/h | INTRAVENOUS | Status: DC
  Administered 2017-08-16 (×2): 60 mg/h via INTRAVENOUS
  Filled 2017-08-16 (×2): qty 200

## 2017-08-16 MED ORDER — DILTIAZEM HCL 100 MG IV SOLR: 5.0000 mg/h | INTRAVENOUS | Status: DC

## 2017-08-16 MED ORDER — ALBUTEROL SULFATE (2.5 MG/3ML) 0.083% IN NEBU
2.5000 mg | INHALATION_SOLUTION | Freq: Four times a day (QID) | RESPIRATORY_TRACT | Status: DC | PRN
Start: 2017-08-16 — End: 2017-08-25

## 2017-08-16 MED ORDER — OXYCODONE-ACETAMINOPHEN 5-325 MG PO TABS
1.0000 | ORAL_TABLET | Freq: Three times a day (TID) | ORAL | Status: DC | PRN
  Administered 2017-08-16 – 2017-08-25 (×10): 1 via ORAL
  Filled 2017-08-16 (×10): qty 1

## 2017-08-16 MED ORDER — ASPIRIN 81 MG PO CHEW
324.0000 mg | CHEWABLE_TABLET | ORAL | Status: AC
  Filled 2017-08-16: qty 4

## 2017-08-16 MED ORDER — PROMETHAZINE HCL 25 MG PO TABS
25.0000 mg | ORAL_TABLET | ORAL | Status: DC | PRN
  Administered 2017-08-20: 25 mg via ORAL
  Filled 2017-08-16: qty 1

## 2017-08-16 MED ORDER — IPRATROPIUM-ALBUTEROL 0.5-2.5 (3) MG/3ML IN SOLN: 3.0000 mL | RESPIRATORY_TRACT | Status: DC | PRN

## 2017-08-16 MED ORDER — POTASSIUM CHLORIDE CRYS ER 20 MEQ PO TBCR
20.0000 meq | EXTENDED_RELEASE_TABLET | Freq: Two times a day (BID) | ORAL | Status: DC
  Administered 2017-08-16 – 2017-08-21 (×11): 20 meq via ORAL
  Filled 2017-08-16 (×12): qty 1

## 2017-08-16 MED ORDER — MORPHINE SULFATE (PF) 2 MG/ML IV SOLN: 2.0000 mg | INTRAVENOUS | Status: DC | PRN

## 2017-08-16 MED ORDER — SODIUM CHLORIDE 0.9 % IV SOLN
250.0000 mL | INTRAVENOUS | Status: DC | PRN
Start: 2017-08-16 — End: 2017-08-28
  Administered 2017-08-20: 10:00:00 via INTRAVENOUS

## 2017-08-16 MED ORDER — AMIODARONE LOAD VIA INFUSION
150.0000 mg | Freq: Once | INTRAVENOUS | Status: AC
  Administered 2017-08-16: 150 mg via INTRAVENOUS
  Filled 2017-08-16: qty 83.34

## 2017-08-16 MED ORDER — DILTIAZEM HCL 100 MG IV SOLR
5.0000 mg/h | INTRAVENOUS | Status: DC
  Administered 2017-08-16: 5 mg/h via INTRAVENOUS

## 2017-08-16 MED ORDER — ASPIRIN 325 MG PO TABS
325.0000 mg | ORAL_TABLET | Freq: Every day | ORAL | Status: DC
  Filled 2017-08-16: qty 1

## 2017-08-16 MED ORDER — PANTOPRAZOLE SODIUM 40 MG IV SOLR
40.0000 mg | Freq: Every day | INTRAVENOUS | Status: DC
  Administered 2017-08-16: 40 mg via INTRAVENOUS
  Filled 2017-08-16: qty 40

## 2017-08-16 MED ORDER — HYDROCHLOROTHIAZIDE 25 MG PO TABS
25.0000 mg | ORAL_TABLET | Freq: Every day | ORAL | Status: DC
  Administered 2017-08-16: 25 mg via ORAL
  Filled 2017-08-16: qty 1

## 2017-08-16 MED ORDER — LOSARTAN POTASSIUM 50 MG PO TABS
50.0000 mg | ORAL_TABLET | Freq: Every day | ORAL | Status: DC
  Administered 2017-08-16: 50 mg via ORAL
  Filled 2017-08-16: qty 1

## 2017-08-16 MED ORDER — IOPAMIDOL (ISOVUE-370) INJECTION 76%
75.0000 mL | Freq: Once | INTRAVENOUS | Status: AC | PRN
  Administered 2017-08-16: 75 mL via INTRAVENOUS

## 2017-08-16 MED ORDER — DILTIAZEM HCL 100 MG IV SOLR
INTRAVENOUS | Status: AC
  Filled 2017-08-16: qty 100

## 2017-08-16 MED ORDER — HEPARIN SODIUM (PORCINE) 5000 UNIT/ML IJ SOLN
5000.0000 [IU] | Freq: Three times a day (TID) | INTRAMUSCULAR | Status: DC
  Administered 2017-08-16 – 2017-08-18 (×5): 5000 [IU] via SUBCUTANEOUS
  Filled 2017-08-16 (×6): qty 1

## 2017-08-16 MED ORDER — DILTIAZEM HCL ER COATED BEADS 300 MG PO CP24
300.0000 mg | ORAL_CAPSULE | Freq: Every day | ORAL | Status: DC
  Filled 2017-08-16: qty 1

## 2017-08-16 MED ORDER — INSULIN ASPART 100 UNIT/ML ~~LOC~~ SOLN
0.0000 [IU] | Freq: Every day | SUBCUTANEOUS | Status: DC
  Administered 2017-08-16 – 2017-08-26 (×2): 2 [IU] via SUBCUTANEOUS
  Administered 2017-08-27: 3 [IU] via SUBCUTANEOUS
  Filled 2017-08-16 (×3): qty 1

## 2017-08-16 MED ORDER — POLYETHYLENE GLYCOL 3350 17 G PO PACK
17.0000 g | PACK | Freq: Every day | ORAL | Status: DC | PRN
  Administered 2017-08-17 – 2017-08-18 (×2): 17 g via ORAL
  Filled 2017-08-16 (×3): qty 1

## 2017-08-16 MED ORDER — DIGOXIN 0.25 MG/ML IJ SOLN
0.5000 mg | Freq: Once | INTRAMUSCULAR | Status: AC
  Administered 2017-08-16: 0.5 mg via INTRAVENOUS
  Filled 2017-08-16: qty 2

## 2017-08-16 MED ORDER — METOPROLOL TARTRATE 50 MG PO TABS
50.0000 mg | ORAL_TABLET | Freq: Two times a day (BID) | ORAL | Status: DC
  Administered 2017-08-16: 50 mg via ORAL
  Filled 2017-08-16: qty 1

## 2017-08-16 MED ORDER — ACETAMINOPHEN 325 MG PO TABS
650.0000 mg | ORAL_TABLET | ORAL | Status: DC | PRN
  Administered 2017-08-17 (×2): 650 mg via ORAL
  Administered 2017-08-19: 325 mg via ORAL
  Administered 2017-08-19 – 2017-08-22 (×3): 650 mg via ORAL
  Filled 2017-08-16 (×6): qty 2

## 2017-08-16 NOTE — Consult Note (Signed)
[facility] Consultation   ASSESSMENT/PLAN   Respiratory failure.  Patient with congestive heart failure requiring noninvasive ventilation.  She does have a history of COPD but is not actively wheezing at this time.  Agree with diuresis as tolerated  Rapid atrial fibrillation.  Has not responded to Cardizem infusion will bolus and drip amiodarone and DC Cardizem.  Will use Lopressor as needed.  Obtain 12-lead EKG, serial cardiac enzymes and echocardiography.  Renal insufficiency.  Elevated BUN/creatinine ratio may reflect decreased effective renal plasma flow.     Name: Debra Saunders MRN: [id number] DOB: [birth year]    ADMISSION DATE:  08/16/2017 CONSULTATION DATE: 08/16/2017  REFERRING MD :  Dr. Syble Creek  CHIEF COMPLAINT: Shortness of breath   HISTORY OF PRESENT ILLNESS: Debra Saunders is a 72 year old female with a past medical history remarkable for hypertension, hyperlipidemia, obesity/obstructive sleep apnea, deep venous thrombosis, was not a anticoagulation candidate, status post filter placement, COPD, chronic respiratory failure, diastolic heart dysfunction, renal failure, type 2 diabetes, thyroid nodule, presented with 1 week of increasing shortness of breath.  Her dyspnea was worse with exertion, chest discomfort was also noted in her mid chest area approximately 5-day duration, in the emergency department patient was noted to be in atrial fibrillation with rapid ventricular response in the 160s.  Chest x-ray was remarkable for pulmonary edema pattern and patient was in respiratory distress.  She was placed on a Cardizem infusion and also started on BiPAP.  Pertinent labs reveal a BNP at 655, BUN was 45 with creatinine 1.36, she arrives in the intensive care unit still complaining of some shortness of breath.  Chest CT scan shows pulmonary vascular enlargement consistent with pulmonary hypertension, bilateral pleural effusions right greater than left with  interstitial congestion consistent with pulmonary edema.  Calcifications noted in the mediastinum which represent old granulomatous disease.  PAST MEDICAL HISTORY :  Past Medical History:  Diagnosis Date  . Acute renal failure ([place])   . Arthritis   . Chest pain    a. 01/2012 Cath: nonobs dzs;  c. 04/2014 MV: No ischemia.  . Chronic back pain   . Chronic diastolic CHF (congestive heart failure) ([place])    a. Dx 2005 @ [facility];  b. 02/2014 Echo: Ef 55-60%, no rwma, mild MR, PASP 78mHg.  .Marland KitchenChronic respiratory failure (HCC)    a. on home O2  . COPD (chronic obstructive pulmonary disease) ([facility]    a. Home O2 - 3lpm  . Gastritis   . History of intracranial hemorrhage    a. 11/2012 subarachnoid hemorrhage in the [place].  .Marland KitchenHyperlipidemia   . Hypertension   . Lipoma of back   . Lymphedema    a. Chronic LLE edema.  . Obesity   . Sepsis with metabolic encephalopathy ([facility]   . Sleep apnea   . Streptococcal bacteremia   . Stroke ([facility]   . Symptomatic bradycardia    a. s/p BSX PPM.  . Thyroid disorder   . Thyroid nodule   . Type II diabetes mellitus ([place]   . Urinary incontinence    Past Surgical History:  Procedure Laterality Date  . ABDOMINAL HYSTERECTOMY  1969  . CARDIAC CATHETERIZATION  2013   [place] [facility] No obstructive CAD: Only 20% ostial left circumflex.  . CHOLECYSTECTOMY    . CYST REMOVAL TRUNK  2002   BACK  . INSERT / REPLACE / REMOVE PACEMAKER    . lipoma removal  2014   back  .  PACEMAKER INSERTION  [address] Scientific dual chamber pacemaker implanted by Dr Caryl Comes for symptomatic bradycardia  . PERMANENT PACEMAKER INSERTION N/A 01/09/2014   Procedure: PERMANENT PACEMAKER INSERTION;  Surgeon: Deboraha Sprang, MD;  Location: [facility] CATH LAB;  Service: Cardiovascular;  Laterality: N/A;  . TRIGGER FINGER RELEASE    . VAGINAL HYSTERECTOMY     Prior to Admission medications   Medication Sig Start Date End Date Taking? Authorizing Provider  acetaminophen (TYLENOL) 325  MG tablet Take 325-650 mg by mouth every 6 (six) hours as needed for mild pain. Reported on 10/10/2015   Yes [provider]  albuterol (PROVENTIL) (2.5 MG/3ML) 0.083% nebulizer solution Take 3 mLs (2.5 mg total) by nebulization every 6 (six) hours as needed for wheezing or shortness of breath. 07/16/15  Yes Laverle Hobby, MD  CARTIA XT 240 MG 24 hr capsule TAKE 1 CAPSULE BY MOUTH ONCE DAILY. PATIENT NEEDS TO SCHEDULE AN APPOINTMENT WITH DR. Caryl Comes FOR FURTHER REFILLS. [phone] 07/13/17  Yes Deboraha Sprang, MD  gabapentin (NEURONTIN) 100 MG capsule TAKE 2 CAPSULES BY MOUTH THREE TIMES DAILY 08/12/17  Yes [name], MD  hydrochlorothiazide (HYDRODIURIL) 25 MG tablet TAKE 1/2 (ONE-HALF) TABLET BY MOUTH ONCE DAILY 04/27/17  Yes [name], MD  KLOR-CON M20 20 MEQ tablet TAKE 1 TABLET BY MOUTH TWICE DAILY 02/06/17  Yes [name], MD  losartan (COZAAR) 50 MG tablet Take 1 tablet (50 mg total) by mouth daily. 11/18/16 08/16/17 Yes Theora Gianotti, NP  metFORMIN (GLUCOPHAGE) 500 MG tablet TAKE 2 TABLETS BY MOUTH TWICE DAILY WITH A MEAL 06/19/17  Yes [name], MD  metoprolol tartrate (LOPRESSOR) 50 MG tablet Take 1 tablet (50 mg total) by mouth 2 (two) times daily. 12/15/16 08/16/17 Yes Theora Gianotti, NP  OXYGEN Inhale into the lungs. 3 liters   Yes [provider]  rosuvastatin (CRESTOR) 10 MG tablet Take 1 tablet (10 mg total) by mouth daily. 12/29/16  Yes [name], MD  sildenafil (REVATIO) 20 MG tablet Take 20 mg by mouth 3 (three) times daily. 05/20/17  Yes [provider]  torsemide (DEMADEX) 20 MG tablet Take 1 tablet (20 mg total) by mouth 2 (two) times daily. Patient taking differently: Take 20 mg by mouth daily as needed.  10/19/15  Yes Jackolyn Confer, MD  Blood Glucose Monitoring Suppl (TRUE METRIX AIR GLUCOSE METER) w/Device KIT USE AS DIRECTED 01/02/16   Jackolyn Confer, MD  Dulaglutide ([name])  2.44 WN/0.2VO SOPN Inject 0.75 mg into the skin once a week. 12/29/16   [name], MD  EASY TOUCH INSULIN SYRINGE 29G X 1/2" 1 ML MISC USE AS DIRECTED WITH  LEVEMIR  INSULIN 12/24/15   Jackolyn Confer, MD  glucose blood test strip Use as instructed to check blood glucose three times a day. e11.8 06/09/17   [name], MD  Lancets MISC Use as directed to check blood sugar three times daily e11.8 10/31/15   Jackolyn Confer, MD  meclizine (ANTIVERT) 25 MG tablet Take 25 mg by mouth daily as needed for dizziness. Reported on 10/10/2015    [provider]  Menthol, Topical Analgesic, (BIOFREEZE EX) Apply 1 application topically as needed (for pain).     [provider]  ondansetron (ZOFRAN) 8 MG tablet Take 1 tablet (8 mg total) by mouth every 8 (eight) hours as needed for nausea or vomiting. 09/25/14   Jackolyn Confer, MD  oxyCODONE-acetaminophen (PERCOCET/ROXICET)  5-325 MG tablet Take 1 tablet by mouth every 8 (eight) hours as needed for severe pain. 01/07/16   Jackolyn Confer, MD  polyethylene glycol (MIRALAX / GLYCOLAX) packet Take 17 g by mouth daily as needed for moderate constipation.    [provider]  promethazine (PHENERGAN) 25 MG tablet Take 25 mg by mouth every 4 (four) hours as needed for nausea or vomiting. Reported on 10/10/2015    [provider]   Allergies  Allergen Reactions  . Aspirin Hives and Other (See Comments)    Pt states that she is unable to take because she had bleeding in her brain.    . Other Other (See Comments)    Pt states that she is unable to take blood thinners because she had bleeding in her brain.   [name] Penicillins Hives, Shortness Of Breath, Swelling and Other (See Comments)    Has patient had a PCN reaction causing immediate rash, facial/tongue/throat swelling, SOB or lightheadedness with hypotension: Yes Has patient had a PCN reaction causing severe rash involving mucus membranes or skin necrosis: No Has  patient had a PCN reaction that required hospitalization No Has patient had a PCN reaction occurring within the last 10 years: No If all of the above answers are "NO", then may proceed with Cephalosporin use.    FAMILY HISTORY:  Family History  Problem Relation Age of Onset  . Heart disease Mother   . Hypertension Mother   . COPD Mother        was a smoker  . Thyroid disease Mother   . Hypertension Father   . Hypertension Sister   . Thyroid disease Sister   . Hypertension Sister   . Thyroid disease Sister   . Breast cancer Maternal Aunt   . Kidney disease Neg Hx   . Bladder Cancer Neg Hx    SOCIAL HISTORY:  reports that she quit smoking about 27 years ago. Her smoking use included cigarettes. She has a 0.60 pack-year smoking history. she has never used smokeless tobacco. She reports that she does not drink alcohol or use drugs.  REVIEW OF SYSTEMS:   .   The remainder of systems were reviewed and were found to be negative other than what is documented in the HPI.    VITAL SIGNS: Temp:  [98.7 F (37.1 C)] 98.7 F (37.1 C) (02/24 0930) Pulse Rate:  [56-160] 56 (02/24 1200) Resp:  [24-45] 37 (02/24 1200) BP: (107-116)/(62-85) 108/78 (02/24 1200) SpO2:  [98 %-100 %] 100 % (02/24 1200) Weight:  [99.8 kg (220 lb)] 99.8 kg (220 lb) (02/24 0923) HEMODYNAMICS:   INTAKE / OUTPUT: No intake or output data in the 24 hours ending 08/16/17 1332  Physical Examination:   VS: BP 108/78   Pulse (!) 56   Temp 98.7 F (37.1 C) (Oral)   Resp (!) 37   Ht '5\' 1"'  (1.549 m)   Wt 99.8 kg (220 lb)   SpO2 100%   BMI 41.57 kg/m   General Appearance: Patient with moderate respiratory distress, presently on noninvasive ventilation, is in rapid atrial fibrillation with ventricular response of 130 Neuro:without focal findings, mental status, speech normal,. HEENT: Limited oral exam secondary to BiPAP, trachea is midline, no thyromegaly appreciated, accessory muscle utilization  noted Pulmonary:  diffuse crackles appreciated bilateral and posterior Cardiovascular irregularly irregular rhythm with rapid ventricular response Abdomen: Abdominal discomfort noted, bowel sounds appreciated, supra pubic pelvic tenderness.  Patient states she has not urinated in 24 hours Renal:  No costovertebral tenderness  Skin:   warm, no rashes, no ecchymosis  Extremities: normal, no cyanosis, clubbing, no edema, warm with normal capillary refill.    LABS: Reviewed   LABORATORY PANEL:   CBC Recent Labs  Lab 08/16/17 0925  WBC 8.6  HGB 11.8*  HCT 37.3  PLT 230    Chemistries  Recent Labs  Lab 08/16/17 0925 08/16/17 1200  NA 139  --   K 3.7  --   CL 103  --   CO2 23  --   GLUCOSE 136*  --   BUN 45*  --   CREATININE 1.36*  --   CALCIUM 9.3  --   AST  --  25  ALT  --  21  ALKPHOS  --  80  BILITOT  --  0.7    No results for input(s): GLUCAP in the last 168 hours. No results for input(s): PHART, PCO2ART, PO2ART in the last 168 hours. Recent Labs  Lab 08/16/17 1200  AST 25  ALT 21  ALKPHOS 80  BILITOT 0.7  ALBUMIN 3.8    Cardiac Enzymes Recent Labs  Lab 08/16/17 0925  TROPONINI <0.03    RADIOLOGY:  Ct Angio Chest Pe W And/or Wo Contrast  Result Date: 08/16/2017 CLINICAL DATA:  Patient states increased heart rate with SOB and chest pain today. Pt placed on BiPAP in ER. Hx DM, stroke, HTN, COPD, CHF, pacemaker insertion. Former smoker. EXAM: CT ANGIOGRAPHY CHEST WITH CONTRAST TECHNIQUE: Multidetector CT imaging of the chest was performed using the standard protocol during bolus administration of intravenous contrast. Multiplanar CT image reconstructions and MIPs were obtained to evaluate the vascular anatomy. CONTRAST:  8m ISOVUE-370 IOPAMIDOL (ISOVUE-370) INJECTION 76% COMPARISON:  Current chest radiograph.  Chest CT, 03/27/2015 FINDINGS: Cardiovascular: There is satisfactory opacification of the pulmonary arteries to the segmental level. There is no  evidence of a pulmonary embolism. Heart is mildly enlarged. No pericardial effusion. No significant coronary artery calcifications. Main pulmonary artery is dilated to 4.1 cm. Aorta is normal in caliber. Atherosclerotic calcifications are noted along the thoracic aorta and origin of the left subclavian artery. Mediastinum/Nodes: No neck base or axillary masses or enlarged lymph nodes. There are calcified mediastinal nodes consistent with healed granulomatous disease. No mediastinal or hilar masses. No pathologically enlarged lymph nodes. Trachea is patent. Esophagus is unremarkable. Lungs/Pleura: Minimal pleural effusions. Lungs show chronic interstitial thickening with mild associated bronchiectasis, with an upper lobe predominance, similar to the prior CT. Emphysema, mostly paraseptal, is noted bilaterally most prominently at the lung bases. No convincing pneumonia or pulmonary edema. No lung mass or suspicious nodule. No pneumothorax. Upper Abdomen: No acute abnormality. Musculoskeletal: No fracture or acute finding. No osteoblastic or osteolytic lesions. Review of the MIP images confirms the above findings. IMPRESSION: 1. No evidence of a pulmonary embolism. 2. Mild cardiomegaly. 3. Enlargement of the main pulmonary artery raising the possibility of pulmonary hypertension. 4. Trace pleural effusions but no convincing pulmonary edema. 5. Lungs show chronic changes of interstitial fibrosis with emphysema and mild bronchiectasis. These findings are stable from the prior chest CT. 6. Aortic atherosclerosis. Aortic Atherosclerosis (ICD10-I70.0) and Emphysema (ICD10-J43.9). Electronically Signed   By: DLajean ManesM.D.   On: 08/16/2017 12:08   Dg Chest Portable 1 View  Result Date: 08/16/2017 CLINICAL DATA:  Dyspnea EXAM: PORTABLE CHEST 1 VIEW COMPARISON:  07/09/2015 chest radiograph. FINDINGS: Stable configuration of 2 lead left subclavian pacemaker. Stable cardiomediastinal silhouette with mild cardiomegaly. No  pneumothorax. No pleural effusion.  Moderate pulmonary edema. IMPRESSION: Moderate congestive heart failure. Electronically Signed   By: [name] M.D.   On: 08/16/2017 09:51     Charman Blasco, DO  08/16/2017, 1:32 PM

## 2017-08-16 NOTE — Progress Notes (Signed)
Consult noted "[place] may adjust antibiotics for renal function" - no antibiotics noted.   Lanie Schelling A. Carpio, [place].D., BCPS Clinical Pharmacist 08/16/2017 12:26

## 2017-08-16 NOTE — ED Provider Notes (Signed)
[facility] Emergency Department Provider Note  ___________________________________________   First MD Initiated Contact with Patient 08/16/17 [phone]     (approximate)  I have reviewed the triage vital signs and the nursing notes.   HISTORY  Chief Complaint Shortness of Breath   HPI Debra Saunders is a 72 y.o. female with a history of arrhythmia not on anticoagulation because of intracranial hemorrhage who is presenting to the emergency department today with 1 week of worsening shortness of breath as well as right lower chest and upper abdominal pain.  She is denying any chest pain to the center of her chest or any radiation to her bilateral arms or shoulders or neck.  Says the pain is an 8 out of 10 and constant to the right lower chest and right upper quadrant of the abdomen.  She is not reporting any vomiting or nausea.  Says that she has had worsening shortness of breath and continues to be on her baseline 4 L of nasal cannula oxygen because of her CHF.  Patient with baseline left lower extremity swelling that is unchanged.  Past Medical History:  Diagnosis Date  . Acute renal failure ([place])   . Arthritis   . Chest pain    a. 01/2012 Cath: nonobs dzs;  c. 04/2014 MV: No ischemia.  . Chronic back pain   . Chronic diastolic CHF (congestive heart failure) ([place])    a. Dx 2005 @ [facility];  b. 02/2014 Echo: Ef 55-60%, no rwma, mild MR, PASP 30mHg.  .Marland KitchenChronic respiratory failure (HCC)    a. on home O2  . COPD (chronic obstructive pulmonary disease) ([place]    a. Home O2 - 3lpm  . Gastritis   . History of intracranial hemorrhage    a. 11/2012 subarachnoid hemorrhage in the [place] of [place].  .Marland KitchenHyperlipidemia   . Hypertension   . Lipoma of back   . Lymphedema    a. Chronic LLE edema.  . Obesity   . Sepsis with metabolic encephalopathy ([place]   . Sleep apnea   . Streptococcal bacteremia   . Stroke ([place]   . Symptomatic bradycardia    a. s/p BSX PPM.  .  Thyroid disorder   . Thyroid nodule   . Type II diabetes mellitus ([place]   . Urinary incontinence     Patient Active Problem List   Diagnosis Date Noted  . Acute bilateral low back pain without sciatica 01/28/2017  . Irregular heart beat 07/23/2016  . Pain in limb 07/22/2016  . Neuropathy 06/09/2016  . Pain in the chest 03/10/2016  . UTI (urinary tract infection) 03/03/2016  . Chronic deep vein thrombosis (DVT) (HCC) 03/03/2016  . Chronic back pain 10/19/2015  . Goiter 04/25/2015  . Cervical disc disorder with radiculopathy of cervical region 03/30/2015  . Elevated LFTs 06/29/2014  . 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 Abnormality of gait 01/13/2014  . Sinoatrial node dysfunction ([place] 01/09/2014  . Allergic rhinitis 10/26/2013  . Lymphedema 08/03/2013  . Constipation 04/12/2013  . Pulmonary embolus ([place] 01/19/2013  . Subarachnoid hemorrhage ([place] 01/19/2013  . Musculoskeletal chest pain 01/19/2013  . Preop cardiovascular exam 10/19/2012  . Anemia 04/28/2012  . Chronic respiratory failure with hypoxia (HB[place] 04/26/2012  . Diabetes (HE[place] 03/30/2012  . Pulmonary hypertension ([place] 03/30/2012  . Hyperlipidemia 03/30/2012  . Screening for breast cancer 03/30/2012  . Hypothyroidism 03/30/2012  . Chronic diastolic heart failure ([place] 01/22/2012  . Hypertension     Past  Surgical History:  Procedure Laterality Date  . ABDOMINAL HYSTERECTOMY  1969  . CARDIAC CATHETERIZATION  2013   @ [place]: No obstructive CAD: Only 20% ostial left circumflex.  . CHOLECYSTECTOMY    . CYST REMOVAL TRUNK  2002   BACK  . INSERT / REPLACE / REMOVE PACEMAKER    . lipoma removal  2014   back  . PACEMAKER INSERTION  1 [address] dual chamber pacemaker implanted by Dr Caryl Comes for symptomatic bradycardia  . PERMANENT PACEMAKER INSERTION N/A 01/09/2014   Procedure: PERMANENT PACEMAKER INSERTION;  Surgeon: Deboraha Sprang, MD;  Location: [facility] CATH LAB;  Service: Cardiovascular;   Laterality: N/A;  . TRIGGER FINGER RELEASE    . VAGINAL HYSTERECTOMY      Prior to Admission medications   Medication Sig Start Date End Date Taking? Authorizing Provider  acetaminophen (TYLENOL) 325 MG tablet Take 325-650 mg by mouth every 6 (six) hours as needed for mild pain. Reported on 10/10/2015   Yes [provider]  albuterol (PROVENTIL) (2.5 MG/3ML) 0.083% nebulizer solution Take 3 mLs (2.5 mg total) by nebulization every 6 (six) hours as needed for wheezing or shortness of breath. 07/16/15  Yes Laverle Hobby, MD  CARTIA XT 240 MG 24 hr capsule TAKE 1 CAPSULE BY MOUTH ONCE DAILY. PATIENT NEEDS TO SCHEDULE AN APPOINTMENT WITH DR. Caryl Comes FOR FURTHER REFILLS. [phone] 07/13/17  Yes Deboraha Sprang, MD  gabapentin (NEURONTIN) 100 MG capsule TAKE 2 CAPSULES BY MOUTH THREE TIMES DAILY 08/12/17  Yes [name], MD  hydrochlorothiazide (HYDRODIURIL) 25 MG tablet TAKE 1/2 (ONE-HALF) TABLET BY MOUTH ONCE DAILY 04/27/17  Yes [name], MD  KLOR-CON M20 20 MEQ tablet TAKE 1 TABLET BY MOUTH TWICE DAILY 02/06/17  Yes [name], MD  losartan (COZAAR) 50 MG tablet Take 1 tablet (50 mg total) by mouth daily. 11/18/16 08/16/17 Yes Theora Gianotti, NP  metFORMIN (GLUCOPHAGE) 500 MG tablet TAKE 2 TABLETS BY MOUTH TWICE DAILY WITH A MEAL 06/19/17  Yes [name], MD  metoprolol tartrate (LOPRESSOR) 50 MG tablet Take 1 tablet (50 mg total) by mouth 2 (two) times daily. 12/15/16 08/16/17 Yes Theora Gianotti, NP  OXYGEN Inhale into the lungs. 3 liters   Yes [provider]  rosuvastatin (CRESTOR) 10 MG tablet Take 1 tablet (10 mg total) by mouth daily. 12/29/16  Yes [name], MD  sildenafil (REVATIO) 20 MG tablet Take 20 mg by mouth 3 (three) times daily. 05/20/17  Yes [provider]  torsemide (DEMADEX) 20 MG tablet Take 1 tablet (20 mg total) by mouth 2 (two) times daily. Patient taking differently: Take 20 mg by mouth  daily as needed.  10/19/15  Yes Jackolyn Confer, MD  Blood Glucose Monitoring Suppl (TRUE METRIX AIR GLUCOSE METER) w/Device KIT USE AS DIRECTED 01/02/16   Jackolyn Confer, MD  cyclobenzaprine (FLEXERIL) 5 MG tablet Take 1 tablet (5 mg total) by mouth 3 (three) times daily as needed for muscle spasms. Patient not taking: Reported on 08/16/2017 07/23/16   [name], MD  Dulaglutide ([name]) 7.61 PJ/0.9TO SOPN Inject 0.75 mg into the skin once a week. 12/29/16   [name], MD  EASY TOUCH INSULIN SYRINGE 29G X 1/2" 1 ML MISC USE AS DIRECTED WITH  LEVEMIR  INSULIN 12/24/15   Jackolyn Confer, MD  glucose blood test strip Use as instructed to check blood glucose three times a day. e11.8 06/09/17   [name]  G, MD  Lancets MISC Use as directed to check blood sugar three times daily e11.8 10/31/15   Jackolyn Confer, MD  meclizine (ANTIVERT) 25 MG tablet Take 25 mg by mouth daily as needed for dizziness. Reported on 10/10/2015    [provider]  Menthol, Topical Analgesic, (BIOFREEZE EX) Apply 1 application topically as needed (for pain).     [provider]  ondansetron (ZOFRAN) 8 MG tablet Take 1 tablet (8 mg total) by mouth every 8 (eight) hours as needed for nausea or vomiting. 09/25/14   Jackolyn Confer, MD  oxyCODONE-acetaminophen (PERCOCET/ROXICET) 5-325 MG tablet Take 1 tablet by mouth every 8 (eight) hours as needed for severe pain. 01/07/16   Jackolyn Confer, MD  polyethylene glycol (MIRALAX / GLYCOLAX) packet Take 17 g by mouth daily as needed for moderate constipation.    [provider]  promethazine (PHENERGAN) 25 MG tablet Take 25 mg by mouth every 4 (four) hours as needed for nausea or vomiting. Reported on 10/10/2015    [provider]    Allergies Aspirin; Other; and Penicillins  Family History  Problem Relation Age of Onset  . Heart disease Mother   . Hypertension Mother   . COPD Mother        was a smoker  .  Thyroid disease Mother   . Hypertension Father   . Hypertension Sister   . Thyroid disease Sister   . Hypertension Sister   . Thyroid disease Sister   . Breast cancer Maternal Aunt   . Kidney disease Neg Hx   . Bladder Cancer Neg Hx     Social History Social History   Tobacco Use  . Smoking status: Former Smoker    Packs/day: 0.30    Years: 2.00    Pack years: 0.60    Types: Cigarettes    Last attempt to quit: 06/23/1990    Years since quitting: 27.1  . Smokeless tobacco: Never Used  Substance Use Topics  . Alcohol use: No  . Drug use: No    Review of Systems  Constitutional: No fever/chills Eyes: No visual changes. ENT: No sore throat. Cardiovascular: Denies chest pain. Respiratory: Denies shortness of breath. Gastrointestinal: No nausea, no vomiting.  No diarrhea.  No constipation. Genitourinary: Negative for dysuria. Musculoskeletal: Negative for back pain. Skin: Negative for rash. Neurological: Negative for headaches, focal weakness or numbness.   ____________________________________________   PHYSICAL EXAM:  VITAL SIGNS: ED Triage Vitals [08/16/17 0923]  Enc Vitals Group     BP      Pulse      Resp      Temp      Temp src      SpO2      Weight 220 lb (99.8 kg)     Height '5\' 1"'  (1.549 m)     Head Circumference      Peak Flow      Pain Score      Pain Loc      Pain Edu?      Excl. in [place]?     Constitutional: Alert and oriented. Well appearing and in no acute distress. Eyes: Conjunctivae are normal.  Head: Atraumatic. Nose: No congestion/rhinnorhea. Mouth/Throat: Mucous membranes are moist.  Neck: No stridor.   Cardiovascular: Normal rate, regular rhythm. Grossly normal heart sounds.  Good peripheral circulation. Respiratory: Tachypnea with labored respirations and rales to the bilateral bases. Gastrointestinal: Soft and nontender. No distention.  Musculoskeletal: No lower extremity tenderness nor edema.  No joint effusions. Neurologic:   Normal speech and language. No gross focal neurologic deficits are appreciated. Skin:  Skin is warm, dry and intact. No rash noted. Psychiatric: Mood and affect are normal. Speech and behavior are normal.  ____________________________________________   LABS (all labs ordered are listed, but only abnormal results are displayed)  Labs Reviewed  BASIC METABOLIC PANEL - Abnormal; Notable for the following components:      Result Value   Glucose, Bld 136 (*)    BUN 45 (*)    Creatinine, Ser 1.36 (*)    GFR calc non Af Amer 37 (*)    GFR calc Af Amer 43 (*)    All other components within normal limits  CBC - Abnormal; Notable for the following components:   Hemoglobin 11.8 (*)    MCHC 31.6 (*)    All other components within normal limits  BRAIN NATRIURETIC PEPTIDE - Abnormal; Notable for the following components:   B Natriuretic Peptide 655.0 (*)    All other components within normal limits  TROPONIN I   ____________________________________________  EKG  ED ECG REPORT I, Doran Stabler, the attending physician, personally viewed and interpreted this ECG.   Date: 08/16/2017  EKG Time: 0932  Rate: 155  Rhythm: atrial fibrillation, rate 155  Axis: Normal  Intervals:none  ST&T Change: No ST segment elevation or depression.  No abnormal T wave inversion.  ____________________________________________  RADIOLOGY  Moderate CHF ____________________________________________   PROCEDURES  Procedure(s) performed:   .Critical Care Performed by: Orbie Pyo, MD Authorized by: Orbie Pyo, MD   Critical care provider statement:    Critical care time (minutes):  35   Critical care was necessary to treat or prevent imminent or life-threatening deterioration of the following conditions:  Cardiac failure and respiratory failure   Critical care was time spent personally by me on the following activities:  Ordering and performing treatments and  interventions, ordering and review of laboratory studies, ordering and review of radiographic studies, pulse oximetry, re-evaluation of patient's condition and examination of patient    Critical Care performed:   ____________________________________________   INITIAL IMPRESSION / [place] / ED COURSE  Pertinent labs & imaging results that were available during my care of the patient were reviewed by me and considered in my medical decision making (see chart for details).  Differential diagnosis includes, but is not limited to, ACS, aortic dissection, pulmonary embolism, cardiac tamponade, pneumothorax, pneumonia, pericarditis, myocarditis, GI-related causes including esophagitis/gastritis, and musculoskeletal chest wall pain.   Differential diagnosis includes, but is not limited to, biliary disease (biliary colic, acute cholecystitis, cholangitis, choledocholithiasis, etc), intrathoracic causes for epigastric abdominal pain including ACS, gastritis, duodenitis, pancreatitis, small bowel or large bowel obstruction, abdominal aortic aneurysm, hernia, and gastritis. As part of my medical decision making, I reviewed the following data within the electronic MEDICAL RECORD NUMBER Notes from prior ED visits  ----------------------------------------- 10:23 AM on 08/16/2017 -----------------------------------------  Patient with very labored respirations.  Placed on BiPAP for support.    ----------------------------------------- 11:46 AM on 08/16/2017 -----------------------------------------  Patient still with rapid heart rate on Cardizem.  We will likely need to titrate the drip up.  Patient tolerating BiPAP but says it is not helping.  We will also order a CTA because of the patient's ongoing right upper quadrant and right chest pain.  Patient will be admitted to the hospital.  Signed out to Dr. Jerelyn Charles.  ____________________________________________   FINAL CLINICAL IMPRESSION(S) /  ED DIAGNOSES  Chest pain.  Atrial fibrillation with RVR.  CHF.    NEW MEDICATIONS STARTED DURING THIS VISIT:  New Prescriptions   No medications on file     Note:  This document was prepared using Dragon voice recognition software and may include unintentional dictation errors.     Orbie Pyo, MD 08/16/17 [phone]

## 2017-08-16 NOTE — ED Notes (Signed)
RT at bedside to place pt on bipap

## 2017-08-16 NOTE — H&P (Signed)
[facility] [place] NAME: Debra Saunders    MR#:  [id number]  DATE OF BIRTH:  March 17, 1946  DATE OF ADMISSION:  08/16/2017  PRIMARY CARE PHYSICIAN: [name], MD   REQUESTING/REFERRING PHYSICIAN:   CHIEF COMPLAINT:   Chief Complaint  Patient presents with  . Shortness of Breath    HISTORY OF PRESENT ILLNESS: Debra Saunders  is a 72 y.o. female with a known history per below presenting with one-week history of shortness of breath, dyspnea worse on exertion, chest discomfort located in her mid chest tube underneath bilateral breasts for 5 days, in the emergency room patient was found to have A. fib with RVR-heart rate 165, tachypnea respiratory rate of 40, chest x-ray noted for edema, BNP 655, CT chest largely unimpressive/negative for PE, patient placed on Cardizem drip without much improvement-heart rate still in the 140s, patient evaluated in emergency room, daughter at the bedside, patient is now been admitted for acute hypoxic hypercarbic on chronic respiratory failure, A. fib with RVR, chest pain, with pacemaker-questionable malfunction.  PAST MEDICAL HISTORY:   Past Medical History:  Diagnosis Date  . Acute renal failure ([place])   . Arthritis   . Chest pain    a. 01/2012 Cath: nonobs dzs;  c. 04/2014 MV: No ischemia.  . Chronic back pain   . Chronic diastolic CHF (congestive heart failure) ([place])    a. Dx 2005 @ [facility];  b. 02/2014 Echo: Ef 55-60%, no rwma, mild MR, PASP 55mHg.  .Marland KitchenChronic respiratory failure (HCC)    a. on home O2  . COPD (chronic obstructive pulmonary disease) ([place]    a. Home O2 - 3lpm  . Gastritis   . History of intracranial hemorrhage    a. 11/2012 subarachnoid hemorrhage in the setting of OSandersfor PE.  .Marland KitchenHyperlipidemia   . Hypertension   . Lipoma of back   . Lymphedema    a. Chronic LLE edema.  . Obesity   . Sepsis with metabolic encephalopathy ([place]   . Sleep apnea   . Streptococcal bacteremia   . Stroke  ([place]   . Symptomatic bradycardia    a. s/p BSX PPM.  . Thyroid disorder   . Thyroid nodule   . Type II diabetes mellitus ([place]   . Urinary incontinence     PAST SURGICAL HISTORY:  Past Surgical History:  Procedure Laterality Date  . ABDOMINAL HYSTERECTOMY  1969  . CARDIAC CATHETERIZATION  2013   [place] obstructive CAD: Only 20% ostial left circumflex.  . CHOLECYSTECTOMY    . CYST REMOVAL TRUNK  2002   BACK  . INSERT / REPLACE / REMOVE PACEMAKER    . lipoma removal  2014   back  . PACEMAKER INSERTION  [address]Scientific dual chamber pacemaker implanted by Dr KCaryl Comesfor symptomatic bradycardia  . PERMANENT PACEMAKER INSERTION N/A 01/09/2014   Procedure: PERMANENT PACEMAKER INSERTION;  Surgeon: SDeboraha Sprang MD;  [facility];  Service: Cardiovascular;  Laterality: N/A;  . TRIGGER FINGER RELEASE    . VAGINAL HYSTERECTOMY      SOCIAL HISTORY:  Social History   Tobacco Use  . Smoking status: Former Smoker    Packs/day: 0.30    Years: 2.00    Pack years: 0.60    Types: Cigarettes    Last attempt to quit: 06/23/1990    Years since quitting: 27.1  . Smokeless tobacco: Never Used  Substance Use Topics  .  Alcohol use: No    FAMILY HISTORY:  Family History  Problem Relation Age of Onset  . Heart disease Mother   . Hypertension Mother   . COPD Mother        was a smoker  . Thyroid disease Mother   . Hypertension Father   . Hypertension Sister   . Thyroid disease Sister   . Hypertension Sister   . Thyroid disease Sister   . Breast cancer Maternal Aunt   . Kidney disease Neg Hx   . Bladder Cancer Neg Hx     DRUG ALLERGIES:  Allergies  Allergen Reactions  . Aspirin Hives and Other (See Comments)    Pt states that she is unable to take because she had bleeding in her brain.    . Other Other (See Comments)    Pt states that she is unable to take blood thinners because she had bleeding in her brain.   [name] Penicillins Hives, Shortness Of Breath,  Swelling and Other (See Comments)    Has patient had a PCN reaction causing immediate rash, facial/tongue/throat swelling, SOB or lightheadedness with hypotension: Yes Has patient had a PCN reaction causing severe rash involving mucus membranes or skin necrosis: No Has patient had a PCN reaction that required hospitalization No Has patient had a PCN reaction occurring within the last 10 years: No If all of the above answers are "NO", then may proceed with Cephalosporin use.    REVIEW OF SYSTEMS:   CONSTITUTIONAL: No fever, +fatigue/weakness.  EYES: No blurred or double vision.  EARS, NOSE, AND THROAT: No tinnitus or ear pain.  RESPIRATORY: No cough, +shortness of breath,no wheezing or hemoptysis.  CARDIOVASCULAR: + chest pain, no orthopnea, chronic left lower extremity edema.  GASTROINTESTINAL: No nausea, vomiting, diarrhea or abdominal pain.  GENITOURINARY: No dysuria, hematuria.  ENDOCRINE: No polyuria, nocturia,  HEMATOLOGY: No anemia, easy bruising or bleeding SKIN: No rash or lesion. MUSCULOSKELETAL: No joint pain or arthritis.   NEUROLOGIC: No tingling, numbness, weakness.  PSYCHIATRY: No anxiety or depression.   MEDICATIONS AT HOME:  Prior to Admission medications   Medication Sig Start Date End Date Taking? Authorizing Provider  acetaminophen (TYLENOL) 325 MG tablet Take 325-650 mg by mouth every 6 (six) hours as needed for mild pain. Reported on 10/10/2015   Yes [provider]  albuterol (PROVENTIL) (2.5 MG/3ML) 0.083% nebulizer solution Take 3 mLs (2.5 mg total) by nebulization every 6 (six) hours as needed for wheezing or shortness of breath. 07/16/15  Yes Laverle Hobby, MD  CARTIA XT 240 MG 24 hr capsule TAKE 1 CAPSULE BY MOUTH ONCE DAILY. PATIENT NEEDS TO SCHEDULE AN APPOINTMENT WITH DR. Caryl Comes FOR FURTHER REFILLS. [phone] 07/13/17  Yes Deboraha Sprang, MD  gabapentin (NEURONTIN) 100 MG capsule TAKE 2 CAPSULES BY MOUTH THREE TIMES DAILY 08/12/17  Yes  [name], MD  hydrochlorothiazide (HYDRODIURIL) 25 MG tablet TAKE 1/2 (ONE-HALF) TABLET BY MOUTH ONCE DAILY 04/27/17  Yes [name], MD  KLOR-CON M20 20 MEQ tablet TAKE 1 TABLET BY MOUTH TWICE DAILY 02/06/17  Yes [name], MD  losartan (COZAAR) 50 MG tablet Take 1 tablet (50 mg total) by mouth daily. 11/18/16 08/16/17 Yes Theora Gianotti, NP  metFORMIN (GLUCOPHAGE) 500 MG tablet TAKE 2 TABLETS BY MOUTH TWICE DAILY WITH A MEAL 06/19/17  Yes [name], MD  metoprolol tartrate (LOPRESSOR) 50 MG tablet Take 1 tablet (50 mg total) by mouth 2 (two) times daily. 12/15/16 08/16/17 Yes Murray Hodgkins  Jori Moll, NP  OXYGEN Inhale into the lungs. 3 liters   Yes [provider]  rosuvastatin (CRESTOR) 10 MG tablet Take 1 tablet (10 mg total) by mouth daily. 12/29/16  Yes [name], MD  sildenafil (REVATIO) 20 MG tablet Take 20 mg by mouth 3 (three) times daily. 05/20/17  Yes [provider]  torsemide (DEMADEX) 20 MG tablet Take 1 tablet (20 mg total) by mouth 2 (two) times daily. Patient taking differently: Take 20 mg by mouth daily as needed.  10/19/15  Yes Jackolyn Confer, MD  Blood Glucose Monitoring Suppl (TRUE METRIX AIR GLUCOSE METER) w/Device KIT USE AS DIRECTED 01/02/16   Jackolyn Confer, MD  Dulaglutide ([name]) 1.01 BP/1.0CH SOPN Inject 0.75 mg into the skin once a week. 12/29/16   [name], MD  EASY TOUCH INSULIN SYRINGE 29G X 1/2" 1 ML MISC USE AS DIRECTED WITH  LEVEMIR  INSULIN 12/24/15   Jackolyn Confer, MD  glucose blood test strip Use as instructed to check blood glucose three times a day. e11.8 06/09/17   [name], MD  Lancets MISC Use as directed to check blood sugar three times daily e11.8 10/31/15   Jackolyn Confer, MD  meclizine (ANTIVERT) 25 MG tablet Take 25 mg by mouth daily as needed for dizziness. Reported on 10/10/2015    [provider]  Menthol, Topical Analgesic, (BIOFREEZE EX)  Apply 1 application topically as needed (for pain).     [provider]  ondansetron (ZOFRAN) 8 MG tablet Take 1 tablet (8 mg total) by mouth every 8 (eight) hours as needed for nausea or vomiting. 09/25/14   Jackolyn Confer, MD  oxyCODONE-acetaminophen (PERCOCET/ROXICET) 5-325 MG tablet Take 1 tablet by mouth every 8 (eight) hours as needed for severe pain. 01/07/16   Jackolyn Confer, MD  polyethylene glycol (MIRALAX / GLYCOLAX) packet Take 17 g by mouth daily as needed for moderate constipation.    [provider]  promethazine (PHENERGAN) 25 MG tablet Take 25 mg by mouth every 4 (four) hours as needed for nausea or vomiting. Reported on 10/10/2015    [provider]      PHYSICAL EXAMINATION:   VITAL SIGNS: Blood pressure 108/78, pulse (!) 56, temperature 98.7 F (37.1 C), temperature source Oral, resp. rate (!) 37, height '5\' 1"'$  (1.549 m), weight 99.8 kg (220 lb), SpO2 100 %.  GENERAL:  72 y.o.-year-old patient lying in the bed with no acute distress.  Frail-appearing, extreme morbid obesity EYES: Pupils equal, round, reactive to light and accommodation. No scleral icterus. Extraocular muscles intact.  HEENT: Head atraumatic, normocephalic. Oropharynx and nasopharynx clear.  NECK:  Supple, no jugular venous distention. No thyroid enlargement, no tenderness.  LUNGS: Normal breath sounds bilaterally, no wheezing, rales,rhonchi or crepitation. No use of accessory muscles of respiration.  CARDIOVASCULAR: Irregular rate and rhythm,  No murmurs, rubs, or gallops.  ABDOMEN: Soft, nontender, nondistended. Bowel sounds present. No organomegaly or mass.  EXTREMITIES: No pedal edema, cyanosis, or clubbing.  NEUROLOGIC: Cranial nerves II through XII are intact. Muscle strength 5/5 in all extremities. Sensation intact. Gait not checked.  PSYCHIATRIC: The patient is alert and oriented x 3.  SKIN: No obvious rash, lesion, or ulcer.   LABORATORY PANEL:   CBC Recent Labs   Lab 08/16/17 0925  WBC 8.6  HGB 11.8*  HCT 37.3  PLT 230  MCV 82.9  MCH 26.2  MCHC 31.6*  RDW 14.5   ------------------------------------------------------------------------------------------------------------------  Chemistries  Recent Labs  Lab 08/16/17 0925  NA 139  K 3.7  CL 103  CO2 23  GLUCOSE 136*  BUN 45*  CREATININE 1.36*  CALCIUM 9.3   ------------------------------------------------------------------------------------------------------------------ estimated creatinine clearance is 39.3 mL/min (A) (by C-G formula based on SCr of 1.36 mg/dL (H)). ------------------------------------------------------------------------------------------------------------------ No results for input(s): TSH, T4TOTAL, T3FREE, THYROIDAB in the last 72 hours.  Invalid input(s): FREET3   Coagulation profile No results for input(s): INR, PROTIME in the last 168 hours. ------------------------------------------------------------------------------------------------------------------- No results for input(s): DDIMER in the last 72 hours. -------------------------------------------------------------------------------------------------------------------  Cardiac Enzymes Recent Labs  Lab 08/16/17 0925  TROPONINI <0.03   ------------------------------------------------------------------------------------------------------------------ Invalid input(s): POCBNP  ---------------------------------------------------------------------------------------------------------------  Urinalysis    Component Value Date/Time   COLORURINE YELLOW (A) 05/23/2015 1200   APPEARANCEUR TURBID (A) 05/23/2015 1200   APPEARANCEUR Clear 11/18/2013 0443   LABSPEC 1.024 05/23/2015 1200   LABSPEC 1.049 11/18/2013 0443   PHURINE 5.0 05/23/2015 1200   GLUCOSEU 150 (A) 05/23/2015 1200   GLUCOSEU Negative 11/18/2013 0443   HGBUR NEGATIVE 05/23/2015 1200   BILIRUBINUR small 03/10/2016 1431   BILIRUBINUR  Negative 11/18/2013 0443   KETONESUR NEGATIVE 05/23/2015 1200   PROTEINUR 100 03/10/2016 1431   PROTEINUR NEGATIVE 05/23/2015 1200   UROBILINOGEN 1.0 03/10/2016 1431   NITRITE negative 03/10/2016 1431   NITRITE NEGATIVE 05/23/2015 1200   LEUKOCYTESUR Negative 03/10/2016 1431   LEUKOCYTESUR Negative 11/18/2013 0443     RADIOLOGY: Ct Angio Chest Pe W And/or Wo Contrast  Result Date: 08/16/2017 CLINICAL DATA:  Patient states increased heart rate with SOB and chest pain today. Pt placed on BiPAP in ER. Hx DM, stroke, HTN, COPD, CHF, pacemaker insertion. Former smoker. EXAM: CT ANGIOGRAPHY CHEST WITH CONTRAST TECHNIQUE: Multidetector CT imaging of the chest was performed using the standard protocol during bolus administration of intravenous contrast. Multiplanar CT image reconstructions and MIPs were obtained to evaluate the vascular anatomy. CONTRAST:  70m ISOVUE-370 IOPAMIDOL (ISOVUE-370) INJECTION 76% COMPARISON:  Current chest radiograph.  Chest CT, 03/27/2015 FINDINGS: Cardiovascular: There is satisfactory opacification of the pulmonary arteries to the segmental level. There is no evidence of a pulmonary embolism. Heart is mildly enlarged. No pericardial effusion. No significant coronary artery calcifications. Main pulmonary artery is dilated to 4.1 cm. Aorta is normal in caliber. Atherosclerotic calcifications are noted along the thoracic aorta and origin of the left subclavian artery. Mediastinum/Nodes: No neck base or axillary masses or enlarged lymph nodes. There are calcified mediastinal nodes consistent with healed granulomatous disease. No mediastinal or hilar masses. No pathologically enlarged lymph nodes. Trachea is patent. Esophagus is unremarkable. Lungs/Pleura: Minimal pleural effusions. Lungs show chronic interstitial thickening with mild associated bronchiectasis, with an upper lobe predominance, similar to the prior CT. Emphysema, mostly paraseptal, is noted bilaterally most  prominently at the lung bases. No convincing pneumonia or pulmonary edema. No lung mass or suspicious nodule. No pneumothorax. Upper Abdomen: No acute abnormality. Musculoskeletal: No fracture or acute finding. No osteoblastic or osteolytic lesions. Review of the MIP images confirms the above findings. IMPRESSION: 1. No evidence of a pulmonary embolism. 2. Mild cardiomegaly. 3. Enlargement of the main pulmonary artery raising the possibility of pulmonary hypertension. 4. Trace pleural effusions but no convincing pulmonary edema. 5. Lungs show chronic changes of interstitial fibrosis with emphysema and mild bronchiectasis. These findings are stable from the prior chest CT. 6. Aortic atherosclerosis. Aortic Atherosclerosis (ICD10-I70.0) and Emphysema (ICD10-J43.9). Electronically Signed   By: DLajean ManesM.D.   On: 08/16/2017 12:08   Dg Chest Portable  1 View  Result Date: 08/16/2017 CLINICAL DATA:  Dyspnea EXAM: PORTABLE CHEST 1 VIEW COMPARISON:  07/09/2015 chest radiograph. FINDINGS: Stable configuration of 2 lead left subclavian pacemaker. Stable cardiomediastinal silhouette with mild cardiomegaly. No pneumothorax. No pleural effusion. Moderate pulmonary edema. IMPRESSION: Moderate congestive heart failure. Electronically Signed   By: [name] M.D.   On: 08/16/2017 09:51    EKG: Orders placed or performed during the hospital encounter of 08/16/17  . ED EKG within 10 minutes  . ED EKG within 10 minutes    IMPRESSION AND PLAN: 1 acute on chronic hypoxic hypercapnic respiratory failure Most likely secondary to acute A. fib with RVR, probable pacemaker malfunction Case discussed with the ED attending, intensivist, admit to stepdown unit, continue BiPAP with weaning as tolerated, will interrogate pacer, cardiology for expert opinion, rule out acute coronary syndrome with cardiac enzymes x3 sets, noted CT chest negative for PE, clinical exam inconsistent with heart failure-suspect elevated BNP  related to A. fib with RVR/chest x-ray is in overcall due to obesity, supplemental oxygen as needed with weaning as tolerated-on 3-4 L continuous at home, and continue close medical monitoring  2 acute on chronic A. fib with RVR Status post pacer None known anticoagulation given history of intracranial hemorrhage Pacer interrogation as stated above, continue Cardizem drip, IV digoxin x1 now, continue Cardizem CD at increased dose of 300 mg daily, beta-blocker therapy, echocardiogram if not done within the last year, check TSH, and continue close medical monitoring  3 acute atypical chest pain Ongoing for approximately 5 days Suspected secondary to A. fib with RVR Continue losartan, Lopressor, statin therapy-check lipids in the morning, aspirin, nitrates as needed, IV morphine as needed breakthrough pain, cycle cardiac enzymes, cardiology to see, echocardiogram if not done within the last year  4 history of left lower extremity DVT With associated chronic left lower extremity edema Status post IVC filter placement Not on anticoagulation given history of intracranial hemorrhage  5 chronic morbid obesity Most likely secondary to excess calories Lifestyle modification recommended  6 chronic diabetes mellitus type 2 Sliding scale insulin with Accu-Cheks per routine, hold metformin  7 chronic diastolic congestive heart failure without exacerbation Stable Continue current regiment  8 COPD without exacerbation Stable Breathing treatments as needed  9 history of thyroid disease Check TSH level  All the records are reviewed and case discussed with ED provider. Management plans discussed with the patient, family and they are in agreement.  CODE STATUS:full Code Status History    Date Active Date Inactive Code Status Order ID Comments User Context   03/30/2015 21:52 04/03/2015 21:22 Full Code [id number]  Etta Quill, DO Inpatient   03/27/2015 22:08 03/28/2015 17:37 Full Code [id number]   Nicholes Mango, MD ED   01/09/2014 14:18 01/10/2014 18:03 Full Code [id number]  Deboraha Sprang, MD Inpatient   01/16/2013 23:35 01/27/2013 20:11 Full Code [id number]  Collene Gobble, MD Inpatient       TOTAL TIME TAKING CARE OF THIS PATIENT: 45 minutes of critical care time was used to review chart, discuss plan of care with subspecialty staff which includes ED attending, nursing staff, intensivist, the patient, the patient's family with all questions answered.    Avel Peace Salary M.D on 08/16/2017   Between 7am to 6pm - Pager - [phone]  After 6pm go to [email or web address] - password [email or web address] [facility]  Office  [phone]  CC: Primary care physician; [name], MD   Note: This dictation was prepared  with Dragon dictation along with smaller phrase technology. Any transcriptional errors that result from this process are unintentional.

## 2017-08-17 ENCOUNTER — Inpatient Hospital Stay
Admit: 2017-08-17 | Discharge: 2017-08-17 | Disposition: A | Discharge status: Home / Self Care | Payer: Medicare HMO | Attending: Family Medicine | Admitting: Family Medicine

## 2017-08-17 ENCOUNTER — Other Ambulatory Visit: Payer: Self-pay

## 2017-08-17 DIAGNOSIS — J9602 Acute respiratory failure with hypercapnia: Secondary | ICD-10-CM

## 2017-08-17 DIAGNOSIS — J9601 Acute respiratory failure with hypoxia: Secondary | ICD-10-CM

## 2017-08-17 LAB — LIPID PANEL
Cholesterol: 102 mg/dL (ref 0–200)
HDL: 52 mg/dL (ref >40)
LDL Cholesterol: 37 mg/dL (ref 0–99)
Total CHOL/HDL Ratio: 2 RATIO
Triglycerides: 67 mg/dL (ref <150)
VLDL: 13 mg/dL (ref 0–40)

## 2017-08-17 LAB — BASIC METABOLIC PANEL
Anion gap: 9 (ref 5–15)
BUN: 42 mg/dL — ABNORMAL HIGH (ref 6–20)
CO2: 24 mmol/L (ref 22–32)
Calcium: 8.4 mg/dL — ABNORMAL LOW (ref 8.9–10.3)
Chloride: 100 mmol/L — ABNORMAL LOW (ref 101–111)
Creatinine, Ser: 1.25 mg/dL — ABNORMAL HIGH (ref 0.44–1.00)
GFR calc Af Amer: 48 mL/min — ABNORMAL LOW (ref >60)
GFR calc non Af Amer: 41 mL/min — ABNORMAL LOW (ref >60)
Glucose, Bld: 195 mg/dL — ABNORMAL HIGH (ref 65–99)
Potassium: 4 mmol/L (ref 3.5–5.1)
Sodium: 133 mmol/L — ABNORMAL LOW (ref 135–145)

## 2017-08-17 LAB — MAGNESIUM: Magnesium: 1.6 mg/dL — ABNORMAL LOW (ref 1.7–2.4)

## 2017-08-17 LAB — GLUCOSE, CAPILLARY
Glucose-Capillary: 198 mg/dL — ABNORMAL HIGH (ref 65–99)
Glucose-Capillary: 148 mg/dL — ABNORMAL HIGH (ref 65–99)
Glucose-Capillary: 146 mg/dL — ABNORMAL HIGH (ref 65–99)
Glucose-Capillary: 142 mg/dL — ABNORMAL HIGH (ref 65–99)

## 2017-08-17 LAB — CBC
HCT: 33.3 % — ABNORMAL LOW (ref 35.0–47.0)
Hemoglobin: 10.6 g/dL — ABNORMAL LOW (ref 12.0–16.0)
MCH: 26.4 pg (ref 26.0–34.0)
MCHC: 31.9 g/dL — ABNORMAL LOW (ref 32.0–36.0)
MCV: 82.6 fL (ref 80.0–100.0)
Platelets: 219 10*3/uL (ref 150–440)
RBC: 4.04 MIL/uL (ref 3.80–5.20)
RDW: 14.8 % — ABNORMAL HIGH (ref 11.5–14.5)
WBC: 5.4 10*3/uL (ref 3.6–11.0)

## 2017-08-17 LAB — TROPONIN I: Troponin I: <0.03 ng/mL (ref <0.03)

## 2017-08-17 MED ORDER — MAGNESIUM SULFATE 4 GM/100ML IV SOLN
4.0000 g | Freq: Once | INTRAVENOUS | Status: AC
  Administered 2017-08-17: 4 g via INTRAVENOUS
  Filled 2017-08-17: qty 100

## 2017-08-17 MED ORDER — METOPROLOL TARTRATE 50 MG PO TABS
50.0000 mg | ORAL_TABLET | Freq: Four times a day (QID) | ORAL | Status: DC
  Administered 2017-08-17 – 2017-08-21 (×12): 50 mg via ORAL
  Filled 2017-08-17 (×14): qty 1

## 2017-08-17 MED ORDER — FUROSEMIDE 10 MG/ML IJ SOLN
20.0000 mg | Freq: Two times a day (BID) | INTRAMUSCULAR | Status: DC
  Administered 2017-08-17 – 2017-08-23 (×12): 20 mg via INTRAVENOUS
  Filled 2017-08-17 (×14): qty 2

## 2017-08-17 MED ORDER — PANTOPRAZOLE SODIUM 40 MG PO TBEC
40.0000 mg | DELAYED_RELEASE_TABLET | Freq: Every day | ORAL | Status: DC
  Administered 2017-08-17 – 2017-08-20 (×4): 40 mg via ORAL
  Filled 2017-08-17 (×4): qty 1

## 2017-08-17 NOTE — Progress Notes (Signed)
PHARMACIST - PHYSICIAN COMMUNICATION  CONCERNING: IV to Oral Route Change Policy  RECOMMENDATION: This patient is receiving pantoprazole by the intravenous route.  Based on criteria approved by the Pharmacy and Therapeutics Committee, the intravenous medication(s) is/are being converted to the equivalent oral dose form(s).   DESCRIPTION: These criteria include:  The patient is eating (either orally or via tube) and/or has been taking other orally administered medications for a least 24 hours  The patient has no evidence of active gastrointestinal bleeding or impaired GI absorption (gastrectomy, short bowel, patient on TNA or NPO).  If you have questions about this conversion, please contact the Pharmacy Department  []   [phone] )  [facility] [x]   [phone] )  [facility] []   [phone] )  [facility] []   [phone] )  [facility] []   [phone] )  [place], [facility] 08/17/2017 9:00 AM

## 2017-08-17 NOTE — Progress Notes (Signed)
   08/17/17 1135  Clinical Encounter Type  Visited With Patient  Visit Type Initial  Spiritual Encounters  Spiritual Needs Emotional;Prayer   Chaplain and patient engaged in conversation around faith and its role in patient's life.  Patient shared thoughts and feelings regarding current health condition and decisions that need to be made.  Chaplain utilized active listening.  Chaplain and patient prayed together.  Patient wanted church to be made aware of her location.  Chaplain agreed to attempt to contact church.  Chaplain attempted phone call, no one answered and there was no way to leave a message.  Chaplain to continue to follow patient and attempt to alert church.

## 2017-08-17 NOTE — Consult Note (Signed)
Cardiology Consultation:   Patient ID: Debra Saunders; [id number]; [birth year]   Admit date: 08/16/2017 Date of Consult: 08/17/2017  Primary Care Provider: [name], MD Primary Cardiologist: Fletcher Anon Primary Electrophysiologist:  Caryl Comes   Patient Profile:   Debra Saunders is a 72 y.o. female with a hx of nonobstructive CAD by prior LHC, chronic diastolic CHF, symptomatic bradycardia s/p PPM, frequent PVCs, chronic respiratory failure with hypoxia on home oxygen secondary to COPD, HTN, HLD, DM2, prior SAH in the setting of [place] for prior DVT now s/p filter placement, chronic right-sided atypical chest pain, lymphedema, stroke, OSA, and obesity who is being seen today for the evaluation of new onset Afib with RVR at the request of Dr. Jerelyn Charles.  History of Present Illness:   Debra Saunders has previously been seen in the office for several month history of palpitations and a "rumbling" in her chest. She was noted to have frequent PVCs in 10/2016 and was started on Lopressor 25 mg bid with improvement in PVC burden. She has chronic dizziness that is worse with movement of her head that has been managed with meclizine. Last PPM interrogation from 08/2016 showed atrial tach. Most recent echo from 01/2017 showed an EF of 60-65%, Gr1DD, aortic sclerosis without stenosis, mild mitral regurgitation, mildly dilated left atrium, normal RVSF, PASP 50 mmHg. Normal stress test in 2015.   Over the past 5 days leading up to her admission she had noted increased SOB with exertion along with right-sided chest discomfort that was unchanged from her prior atypical chest pain. This was preceded with the acute onset of watery diarrhea. Upon her arrival to [facility] she was noted to be in new onset Afib with RVR with heart rates into the 150s bpm. She was initially placed on IV Cardizem without much improvement in her heart rate. She has subsequently been placed on IV amiodarone. She has not been started on  anticoagulation. CXR showed moderate CHF. CTA chest was negative for PE with mild cardiomegaly, possible pulmonary HTN, trace pleural effusions without pulmonary edema, interstitial fibrosis. Labs showed troponin negative x 3, BNP 655, K+ 3.7-->4.0, SCr 1.36-->1.25, Na 139-->133, WBC 8.6, HGB 11.8, PLT 230, TSH normal, blood culture no growth to date x 2, A1c pending, LDL 37. BP stable. Weight 220 pounds. Oxygen saturation 97% on BiPAP. She remains in Afib with RVR with heart rates in the low 100s to 120s bpm on amiodarone infusion.    Past Medical History:  Diagnosis Date  . Acute renal failure ([place])   . Arthritis   . Chest pain    a. 01/2012 Cath: nonobs dzs;  c. 04/2014 MV: No ischemia.  . Chronic back pain   . Chronic diastolic CHF (congestive heart failure) ([place])    a. Dx 2005 @ [facility];  b. 02/2014 Echo: Ef 55-60%, no rwma, mild MR, PASP 40mHg.  .Marland KitchenChronic respiratory failure (HCC)    a. on home O2  . COPD (chronic obstructive pulmonary disease) ([place]    a. Home O2 - 3lpm  . Gastritis   . History of intracranial hemorrhage    a. 11/2012 [place]  .Marland KitchenHyperlipidemia   . Hypertension   . Lipoma of back   . Lymphedema    a. Chronic LLE edema.  . Obesity   . Sepsis with metabolic encephalopathy ([place]   . Sleep apnea   . Streptococcal bacteremia   . Stroke ([place]   . Symptomatic bradycardia  a. s/p BSX PPM.  . Thyroid disorder   . Thyroid nodule   . Type II diabetes mellitus ([place])   . Urinary incontinence     Past Surgical History:  Procedure Laterality Date  . ABDOMINAL HYSTERECTOMY  1969  . CARDIAC CATHETERIZATION  2013   @ [place]: No obstructive CAD: Only 20% ostial left circumflex.  . CHOLECYSTECTOMY    . CYST REMOVAL TRUNK  2002   BACK  . INSERT / REPLACE / REMOVE PACEMAKER    . lipoma removal  2014   back  . PACEMAKER INSERTION  [address] Scientific dual chamber pacemaker implanted by Dr Caryl Comes for symptomatic bradycardia    . PERMANENT PACEMAKER INSERTION N/A 01/09/2014   Procedure: PERMANENT PACEMAKER INSERTION;  Surgeon: Deboraha Sprang, MD;  Location[facility] C[facility] LAB;  Service: Cardiovascular;  Laterality: N/A;  . TRIGGER FINGER RELEASE    . VAGINAL HYSTERECTOMY       Home Meds: Prior to Admission medications   Medication Sig Start Date End Date Taking? Authorizing Provider  acetaminophen (TYLENOL) 325 MG tablet Take 325-650 mg by mouth every 6 (six) hours as needed for mild pain. Reported on 10/10/2015   Yes [provider]  albuterol (PROVENTIL) (2.5 MG/3ML) 0.083% nebulizer solution Take 3 mLs (2.5 mg total) by nebulization every 6 (six) hours as needed for wheezing or shortness of breath. 07/16/15  Yes Laverle Hobby, MD  CARTIA XT 240 MG 24 hr capsule TAKE 1 CAPSULE BY MOUTH ONCE DAILY. PATIENT NEEDS TO SCHEDULE AN APPOINTMENT WITH DR. Caryl Comes FOR FURTHER REFILLS. [phone] 07/13/17  Yes Deboraha Sprang, MD  gabapentin (NEURONTIN) 100 MG capsule TAKE 2 CAPSULES BY MOUTH THREE TIMES DAILY 08/12/17  Yes [name], MD  hydrochlorothiazide (HYDRODIURIL) 25 MG tablet TAKE 1/2 (ONE-HALF) TABLET BY MOUTH ONCE DAILY 04/27/17  Yes [name], MD  KLOR-CON M20 20 MEQ tablet TAKE 1 TABLET BY MOUTH TWICE DAILY 02/06/17  Yes [name], MD  losartan (COZAAR) 50 MG tablet Take 1 tablet (50 mg total) by mouth daily. 11/18/16 08/16/17 Yes Theora Gianotti, NP  metFORMIN (GLUCOPHAGE) 500 MG tablet TAKE 2 TABLETS BY MOUTH TWICE DAILY WITH A MEAL 06/19/17  Yes [name], MD  metoprolol tartrate (LOPRESSOR) 50 MG tablet Take 1 tablet (50 mg total) by mouth 2 (two) times daily. 12/15/16 08/16/17 Yes Theora Gianotti, NP  OXYGEN Inhale into the lungs. 3 liters   Yes [provider]  rosuvastatin (CRESTOR) 10 MG tablet Take 1 tablet (10 mg total) by mouth daily. 12/29/16  Yes [name], MD  sildenafil (REVATIO) 20 MG tablet Take 20 mg by mouth 3 (three) times  daily. 05/20/17  Yes [provider]  torsemide (DEMADEX) 20 MG tablet Take 1 tablet (20 mg total) by mouth 2 (two) times daily. Patient taking differently: Take 20 mg by mouth daily as needed.  10/19/15  Yes Jackolyn Confer, MD  Blood Glucose Monitoring Suppl (TRUE METRIX AIR GLUCOSE METER) w/Device KIT USE AS DIRECTED 01/02/16   Jackolyn Confer, MD  Dulaglutide ([name]) 3.53 GD/9.2EQ SOPN Inject 0.75 mg into the skin once a week. 12/29/16   [name], MD  EASY TOUCH INSULIN SYRINGE 29G X 1/2" 1 ML MISC USE AS DIRECTED WITH  LEVEMIR  INSULIN 12/24/15   Jackolyn Confer, MD  glucose blood test strip Use as instructed to check blood glucose three times a day. e11.8 06/09/17   [name],  MD  Lancets MISC Use as directed to check blood sugar three times daily e11.8 10/31/15   Jackolyn Confer, MD  meclizine (ANTIVERT) 25 MG tablet Take 25 mg by mouth daily as needed for dizziness. Reported on 10/10/2015    [provider]  Menthol, Topical Analgesic, (BIOFREEZE EX) Apply 1 application topically as needed (for pain).     [provider]  ondansetron (ZOFRAN) 8 MG tablet Take 1 tablet (8 mg total) by mouth every 8 (eight) hours as needed for nausea or vomiting. 09/25/14   Jackolyn Confer, MD  oxyCODONE-acetaminophen (PERCOCET/ROXICET) 5-325 MG tablet Take 1 tablet by mouth every 8 (eight) hours as needed for severe pain. 01/07/16   Jackolyn Confer, MD  polyethylene glycol (MIRALAX / GLYCOLAX) packet Take 17 g by mouth daily as needed for moderate constipation.    [provider]  promethazine (PHENERGAN) 25 MG tablet Take 25 mg by mouth every 4 (four) hours as needed for nausea or vomiting. Reported on 10/10/2015    [provider]    Inpatient Medications: Scheduled Meds: . aspirin  324 mg Oral NOW   Or  . aspirin  300 mg Rectal NOW  . Chlorhexidine Gluconate Cloth  6 each Topical Q0600  . gabapentin  200 mg Oral TID  . heparin   5,000 Units Subcutaneous Q8H  . hydrochlorothiazide  25 mg Oral Daily  . insulin aspart  0-20 Units Subcutaneous TID WC  . insulin aspart  0-5 Units Subcutaneous QHS  . losartan  50 mg Oral Daily  . metoprolol tartrate  50 mg Oral BID  . mupirocin ointment  1 application Nasal BID  . pantoprazole (PROTONIX) IV  40 mg Intravenous QHS  . potassium chloride SA  20 mEq Oral BID  . rosuvastatin  10 mg Oral Daily  . sildenafil  20 mg Oral TID   Continuous Infusions: . sodium chloride    . amiodarone 30 mg/hr (08/17/17 0345)  . diltiazem (CARDIZEM) infusion Stopped (08/16/17 1424)   PRN Meds: sodium chloride, acetaminophen, albuterol, ipratropium-albuterol, morphine injection, nitroGLYCERIN, ondansetron (ZOFRAN) IV, oxyCODONE-acetaminophen, polyethylene glycol, promethazine, torsemide  Allergies:   Allergies  Allergen Reactions  . Aspirin Hives and Other (See Comments)    Pt states that she is unable to take because she had bleeding in her brain.    . Other Other (See Comments)    Pt states that she is unable to take blood thinners because she had bleeding in her brain.   [name] Penicillins Hives, Shortness Of Breath, Swelling and Other (See Comments)    Has patient had a PCN reaction causing immediate rash, facial/tongue/throat swelling, SOB or lightheadedness with hypotension: Yes Has patient had a PCN reaction causing severe rash involving mucus membranes or skin necrosis: No Has patient had a PCN reaction that required hospitalization No Has patient had a PCN reaction occurring within the last 10 years: No If all of the above answers are "NO", then may proceed with Cephalosporin use.    Social History:   Social History   Socioeconomic History  . Marital status: Divorced    Spouse name: Not on file  . Number of children: Not on file  . Years of education: Not on file  . Highest education level: Not on file  Social Needs  . Financial resource strain: Not on file  . Food insecurity  - worry: Not on file  . Food insecurity - inability: Not on file  . Transportation needs - medical: Not  on file  . Transportation needs - non-medical: Not on file  Occupational History  . Occupation: Retired- factory work    Comment: exposed to Pharmacist, community  Tobacco Use  . Smoking status: Former Smoker    Packs/day: 0.30    Years: 2.00    Pack years: 0.60    Types: Cigarettes    Last attempt to quit: 06/23/1990    Years since quitting: 27.1  . Smokeless tobacco: Never Used  Substance and Sexual Activity  . Alcohol use: No  . Drug use: No  . Sexual activity: Not on file  Other Topics Concern  . Not on file  Social History Narrative   Lives in [place] with daughter. Has 4 children. No pets.      Work - Restaurant manager, fast food, retired      Diet - regular     Family History:  Family History  Problem Relation Age of Onset  . Heart disease Mother   . Hypertension Mother   . COPD Mother        was a smoker  . Thyroid disease Mother   . Hypertension Father   . Hypertension Sister   . Thyroid disease Sister   . Hypertension Sister   . Thyroid disease Sister   . Breast cancer Maternal Aunt   . Kidney disease Neg Hx   . Bladder Cancer Neg Hx     ROS:  Review of Systems  Constitutional: Positive for malaise/fatigue. Negative for chills, diaphoresis, fever and weight loss.  HENT: Negative for congestion.   Eyes: Negative for discharge and redness.  Respiratory: Positive for shortness of breath. Negative for cough, hemoptysis, sputum production and wheezing.   Cardiovascular: Positive for chest pain. Negative for palpitations, orthopnea, claudication, leg swelling and PND.  Gastrointestinal: Positive for diarrhea. Negative for abdominal pain, blood in stool, constipation, heartburn, melena, nausea and vomiting.  Genitourinary: Negative for hematuria.  Musculoskeletal: Negative for falls and myalgias.  Skin: Negative for rash.  Neurological: Positive for weakness. Negative for  dizziness, tingling, tremors, sensory change, speech change, focal weakness and loss of consciousness.  Endo/Heme/Allergies: Does not bruise/bleed easily.  Psychiatric/Behavioral: Negative for substance abuse. The patient is not nervous/anxious.   All other systems reviewed and are negative.     Physical Exam/Data:   Vitals:   08/17/17 0300 08/17/17 0400 08/17/17 0500 08/17/17 0600  BP: (!) 149/108 118/82 (!) 135/101 (!) 128/96  Pulse: 94 81 91 85  Resp: 15 (!) '31 19 20  ' Temp:      TempSrc:      SpO2: 98% 100% 98% 100%  Weight:    214 lb 8.1 oz (97.3 kg)  Height:        Intake/Output Summary (Last 24 hours) at 08/17/2017 0721 Last data filed at 08/17/2017 4103 Gross per 24 hour  Intake 633.77 ml  Output 500 ml  Net 133.77 ml   Filed Weights   08/16/17 0923 08/16/17 1337 08/17/17 0600  Weight: 220 lb (99.8 kg) 217 lb 9.5 oz (98.7 kg) 214 lb 8.1 oz (97.3 kg)   Body mass index is 40.53 kg/m.   Physical Exam: General: Well developed, well nourished, in no acute distress. Head: Normocephalic, atraumatic, sclera non-icteric, no xanthomas, nares without discharge. Neck: Negative for carotid bruits. JVD difficult to assess 2/2 BiPAP. Lungs: Diminished breath sounds bilaterally with. Breathing is unlabored. BiPAP in place.  Heart: Tachycardic, irregularly irregualr with S1 S2. I/VI systolic murmur at the apex, no rubs, or gallops appreciated. Abdomen: Soft, non-tender,  non-distended with normoactive bowel sounds. No hepatomegaly. No rebound/guarding. No obvious abdominal masses. Msk:  Strength and tone appear normal for age. Extremities: No clubbing or cyanosis. 1+ bilateral LE edema. Distal pedal pulses are 2+ and equal bilaterally. Neuro: Alert and oriented X 3. No facial asymmetry. No focal deficit. Moves all extremities spontaneously. Psych:  Responds to questions appropriately with a normal affect.   EKG:  The EKG was personally reviewed and demonstrates: Afib with RVR, 155  bpm, rare PVCs, baseline wandering Telemetry:  Telemetry was personally reviewed and demonstrates: Afib with RVR, heart rates from the 150s to 90s bpm, occasional PVCs  Weights: Filed Weights   08/16/17 0923 08/16/17 1337 08/17/17 0600  Weight: 220 lb (99.8 kg) 217 lb 9.5 oz (98.7 kg) 214 lb 8.1 oz (97.3 kg)    Relevant CV Studies: TTE 02/11/2017: Study Conclusions  - Left ventricle: The cavity size was normal. Wall thickness was   increased in a pattern of mild LVH. Systolic function was normal.   The estimated ejection fraction was in the range of 60% to 65%.   Doppler parameters are consistent with abnormal left ventricular   relaxation (grade 1 diastolic dysfunction). Doppler parameters   are consistent with high ventricular filling pressure. - Aortic valve: Probably trileaflet; moderately thickened, mildly   calcified leaflets. Sclerosis without stenosis. - Mitral valve: Mildly thickened leaflets . There was mild   regurgitation. - Left atrium: The atrium was mildly dilated. - Right ventricle: The cavity size was at the upper limits of   normal. Systolic function was normal. - Pulmonary arteries: Systolic pressure was moderately increased,   estimated to be 50 mm Hg.  Laboratory Data:  Chemistry Recent Labs  Lab 08/16/17 0925 08/17/17 0435  NA 139 133*  K 3.7 4.0  CL 103 100*  CO2 23 24  GLUCOSE 136* 195*  BUN 45* 42*  CREATININE 1.36* 1.25*  CALCIUM 9.3 8.4*  GFRNONAA 37* 41*  GFRAA 43* 48*  ANIONGAP 13 9    Recent Labs  Lab 08/16/17 1200  PROT 8.0  ALBUMIN 3.8  AST 25  ALT 21  ALKPHOS 80  BILITOT 0.7   Hematology Recent Labs  Lab 08/16/17 0925 08/17/17 0435  WBC 8.6 5.4  RBC 4.50 4.04  HGB 11.8* 10.6*  HCT 37.3 33.3*  MCV 82.9 82.6  MCH 26.2 26.4  MCHC 31.6* 31.9*  RDW 14.5 14.8*  PLT 230 219   Cardiac Enzymes Recent Labs  Lab 08/16/17 0925 08/16/17 1649 08/16/17 2147 08/17/17 0435  TROPONINI <0.03 <0.03 <0.03 <0.03   No results  for input(s): TROPIPOC in the last 168 hours.  BNP Recent Labs  Lab 08/16/17 0925  BNP 655.0*    DDimer No results for input(s): DDIMER in the last 168 hours.  Radiology/Studies:  Ct Angio Chest Pe W And/or Wo Contrast  Result Date: 08/16/2017 IMPRESSION: 1. No evidence of a pulmonary embolism. 2. Mild cardiomegaly. 3. Enlargement of the main pulmonary artery raising the possibility of pulmonary hypertension. 4. Trace pleural effusions but no convincing pulmonary edema. 5. Lungs show chronic changes of interstitial fibrosis with emphysema and mild bronchiectasis. These findings are stable from the prior chest CT. 6. Aortic atherosclerosis. Aortic Atherosclerosis (ICD10-I70.0) and Emphysema (ICD10-J43.9). Electronically Signed   By: Lajean Manes M.D.   On: 08/16/2017 12:08   Dg Chest Portable 1 View  Result Date: 08/16/2017 IMPRESSION: Moderate congestive heart failure. Electronically Signed   By: [name] M.D.   On: 08/16/2017 09:51  Assessment and Plan:   1. New onset Afib with RVR: -Remains in Afib with RVR with improving heart rates -Would discontinue IV amiodarone given she is not anticoagulated and we are uncertain how long she has been in this rhythm -Pursue rate control with CCB/BB for now -Continue diltiazem gtt -Increase Lopressor to 50 mg q 6 hours -CHADS2VASc at least 8 (CHF, HTN, age x 1, DM, stroke x 2, vascular disease, female) -Has not been started on full-dose anticoagulation given history of SAH on prior PAC for DVT -Will need to discuss potential re-trial of [place] given CHADS2VASc -Check echo -TSH normal -Potassium at goal -Check magnesium -Possibly in the setting of diarrhea and underlying pulmonary disease   2. Atypical chest pain: -Currently chest pain free -Ruled out -Echo pending  3. Acute on chronic respiratory failure with hypoxia: -Likely multifactorial including new onset Afib with RVR, COPD, interstitial lung disease, diastolic CHF, pulmonary  HTN -Oxygen per IM/PCCM  4. CKD stage II: -Monitor with gentle diuresis  5. AECOPD: -Per IM -Consider changing albuterol to Xopenex   6. Prior DVT: -Status post IVC filter  7. SAH: -In the setting of [place] for DVT -Uncertain if she would be a candidate for re-trial of [place]   8. HTN: -Stop HCTZ and losartan for added BP room for rate control  9. Acute on chronic diastolic CHF/pulmonary HTN: -IV Lasix -Revatio  -Echo   For questions or updates, please contact [place] Please consult [email or web address] for contact info under Cardiology/STEMI.   Signed, Christell Faith, PA-C [facility] Pager: [phone] 08/17/2017, 7:21 AM

## 2017-08-17 NOTE — Progress Notes (Signed)
Pharmacy Electrolyte Monitoring Consult:  Pharmacy consulted to assist in monitoring and replacing electrolytes in this 72 y.o. female admitted on 08/16/2017 with Shortness of Breath  Patient currently ordered furosemide 20mg  IV Q12hr and potassium 29mEq PO BID.   Labs:  Sodium (mmol/L)  Date Value  08/17/2017 133 (L)  12/28/2013 135 (L)   Potassium (mmol/L)  Date Value  08/17/2017 4.0  12/28/2013 3.2 (L)   Magnesium (mg/dL)  Date Value  08/17/2017 1.6 (L)  11/18/2013 1.7 (L)   Phosphorus (mg/dL)  Date Value  02/08/2013 2.1 (L)   Calcium (mg/dL)  Date Value  08/17/2017 8.4 (L)   Calcium, Total (mg/dL)  Date Value  12/28/2013 9.0   Albumin (g/dL)  Date Value  08/16/2017 3.8  08/16/2013 3.5    Plan: Will replace magnesium 4g IV 1. Will continue potassium 89mEq PO BID. Will recheck electrolytes with am labs.   Pharmacy will continue to monitor and adjust per consult.   Simpson,Michael L 08/17/2017 8:58 AM

## 2017-08-17 NOTE — Progress Notes (Signed)
*  PRELIMINARY RESULTS* Echocardiogram 2D Echocardiogram has been performed.  Sherrie Sport 08/17/2017, 3:09 PM

## 2017-08-17 NOTE — Consult Note (Signed)
  Debra Saunders   Name: Debra Saunders MRN: [id number] DOB: 12-05-1945    ADMISSION DATE:  08/16/2017 Saunders DATE: 08/16/2017  REFERRING MD :  Dr. Syble Creek  CHIEF COMPLAINT: Shortness of breath   HISTORY OF PRESENT ILLNESS: Debra Saunders is a 72 year old female with a past medical history remarkable for hypertension, hyperlipidemia, obesity/obstructive sleep apnea, deep venous thrombosis, was not a anticoagulation candidate, status post filter placement, COPD, chronic respiratory failure, diastolic heart dysfunction, renal failure, type 2 diabetes, thyroid nodule, presented with 1 week of increasing shortness of breath.  Her dyspnea was worse with exertion, chest discomfort was also noted in her mid chest area approximately 5-day duration, in the emergency department patient was noted to be in atrial fibrillation with rapid ventricular response in the 160s.  Chest x-ray was remarkable for pulmonary edema pattern and patient was in respiratory distress.  She was placed on a Cardizem infusion and also started on BiPAP.  Pertinent labs reveal a BNP at 655, BUN was 45 with creatinine 1.36, she arrives in the intensive care unit still complaining of some shortness of breath.  Chest CT scan shows pulmonary vascular enlargement consistent with pulmonary hypertension, bilateral pleural effusions right greater than left with interstitial congestion consistent with pulmonary edema.  Calcifications noted in the mediastinum which represent old granulomatous disease.  SUBJECTIVE Patient resp status slowly improving Weaned off biPAP      VITAL SIGNS: Temp:  [97.5 F (36.4 C)-98.6 F (37 C)] 97.5 F (36.4 C) (02/25 0800) Pulse Rate:  [56-134] 102 (02/25 1000) Resp:  [14-37] 22 (02/25 1000) BP: (108-157)/(78-119) 124/94 (02/25 1000) SpO2:  [88 %-100 %] 98 % (02/25 1000) Weight:  [214 lb 8.1 oz (97.3 kg)-217 lb 9.5 oz (98.7 kg)] 214 lb 8.1 oz (97.3 kg) (02/25  0600)   ASSESSMENT/PLAN   Respiratory failure.  Patient with congestive heart failure requiring noninvasive ventilation.  She does have a history of COPD but is not actively wheezing at this time.  Agree with diuresis as tolerated  Rapid atrial fibrillation.  Has not responded to Cardizem infusion will bolus and drip amiodarone and DC Cardizem.  Will use Lopressor as needed.  Obtain 12-lead EKG, serial cardiac enzymes and echocardiography.  Renal insufficiency.  Elevated BUN/creatinine ratio may reflect decreased effective renal plasma flow.  Ok to transfer to gen med floor    Debra Saunders, M.D.  [facility]  Medical Director [place] Director [name] Cardio-Pulmonary Department

## 2017-08-17 NOTE — Progress Notes (Signed)
[facility] NAME: Antonae Zbikowski    MR#:  [id number]  DATE OF BIRTH:  1946-05-09  SUBJECTIVE:  CHIEF COMPLAINT: Patient shortness of breath improved and chest pain resolved.  Feeling better  REVIEW OF SYSTEMS:  CONSTITUTIONAL: No fever, fatigue or weakness.  EYES: No blurred or double vision.  EARS, NOSE, AND THROAT: No tinnitus or ear pain.  RESPIRATORY: No cough, shortness of breath, wheezing or hemoptysis.  CARDIOVASCULAR: No chest pain, orthopnea, edema.  GASTROINTESTINAL: No nausea, vomiting, diarrhea or abdominal pain.  GENITOURINARY: No dysuria, hematuria.  ENDOCRINE: No polyuria, nocturia,  HEMATOLOGY: No anemia, easy bruising or bleeding SKIN: No rash or lesion. MUSCULOSKELETAL: No joint pain or arthritis.   NEUROLOGIC: No tingling, numbness, weakness.  PSYCHIATRY: No anxiety or depression.   DRUG ALLERGIES:   Allergies  Allergen Reactions  . Aspirin Hives and Other (See Comments)    Pt states that she is unable to take because she had bleeding in her brain.    . Other Other (See Comments)    Pt states that she is unable to take blood thinners because she had bleeding in her brain.   [name] Penicillins Hives, Shortness Of Breath, Swelling and Other (See Comments)    Has patient had a PCN reaction causing immediate rash, facial/tongue/throat swelling, SOB or lightheadedness with hypotension: Yes Has patient had a PCN reaction causing severe rash involving mucus membranes or skin necrosis: No Has patient had a PCN reaction that required hospitalization No Has patient had a PCN reaction occurring within the last 10 years: No If all of the above answers are "NO", then may proceed with Cephalosporin use.    VITALS:  Blood pressure (!) 132/93, pulse (!) 114, temperature (!) 97.5 F (36.4 C), temperature source Oral, resp. rate (!) 22, height 5\' 1"  (1.549 m), weight 97.3 kg (214 lb 8.1 oz), SpO2 98 %.  PHYSICAL  EXAMINATION:  GENERAL:  72 y.o.-year-old patient lying in the bed with no acute distress.  EYES: Pupils equal, round, reactive to light and accommodation. No scleral icterus. Extraocular muscles intact.  HEENT: Head atraumatic, normocephalic. Oropharynx and nasopharynx clear.  NECK:  Supple, no jugular venous distention. No thyroid enlargement, no tenderness.  LUNGS: Normal breath sounds bilaterally, no wheezing, rales,rhonchi or crepitation. No use of accessory muscles of respiration.  CARDIOVASCULAR: Irregularly irregular l. No murmurs, rubs, or gallops.  ABDOMEN: Soft, nontender, nondistended. Bowel sounds present. No organomegaly or mass.  EXTREMITIES: No pedal edema, cyanosis, or clubbing.  NEUROLOGIC: Cranial nerves II through XII are intact. Muscle strength 5/5 in all extremities. Sensation intact. Gait not checked.  PSYCHIATRIC: The patient is alert and oriented x 3.  SKIN: No obvious rash, lesion, or ulcer.    LABORATORY PANEL:   CBC Recent Labs  Lab 08/17/17 0435  WBC 5.4  HGB 10.6*  HCT 33.3*  PLT 219   ------------------------------------------------------------------------------------------------------------------  Chemistries  Recent Labs  Lab 08/16/17 1200 08/17/17 0435  NA  --  133*  K  --  4.0  CL  --  100*  CO2  --  24  GLUCOSE  --  195*  BUN  --  42*  CREATININE  --  1.25*  CALCIUM  --  8.4*  MG  --  1.6*  AST 25  --   ALT 21  --   ALKPHOS 80  --   BILITOT 0.7  --    ------------------------------------------------------------------------------------------------------------------  Cardiac Enzymes Recent Labs  Lab  08/17/17 0435  TROPONINI <0.03   ------------------------------------------------------------------------------------------------------------------  RADIOLOGY:  Ct Angio Chest Pe W And/or Wo Contrast  Result Date: 08/16/2017 CLINICAL DATA:  Patient states increased heart rate with SOB and chest pain today. Pt placed on BiPAP in  ER. Hx DM, stroke, HTN, COPD, CHF, pacemaker insertion. Former smoker. EXAM: CT ANGIOGRAPHY CHEST WITH CONTRAST TECHNIQUE: Multidetector CT imaging of the chest was performed using the standard protocol during bolus administration of intravenous contrast. Multiplanar CT image reconstructions and MIPs were obtained to evaluate the vascular anatomy. CONTRAST:  110mL ISOVUE-370 IOPAMIDOL (ISOVUE-370) INJECTION 76% COMPARISON:  Current chest radiograph.  Chest CT, 03/27/2015 FINDINGS: Cardiovascular: There is satisfactory opacification of the pulmonary arteries to the segmental level. There is no evidence of a pulmonary embolism. Heart is mildly enlarged. No pericardial effusion. No significant coronary artery calcifications. Main pulmonary artery is dilated to 4.1 cm. Aorta is normal in caliber. Atherosclerotic calcifications are noted along the thoracic aorta and origin of the left subclavian artery. Mediastinum/Nodes: No neck base or axillary masses or enlarged lymph nodes. There are calcified mediastinal nodes consistent with healed granulomatous disease. No mediastinal or hilar masses. No pathologically enlarged lymph nodes. Trachea is patent. Esophagus is unremarkable. Lungs/Pleura: Minimal pleural effusions. Lungs show chronic interstitial thickening with mild associated bronchiectasis, with an upper lobe predominance, similar to the prior CT. Emphysema, mostly paraseptal, is noted bilaterally most prominently at the lung bases. No convincing pneumonia or pulmonary edema. No lung mass or suspicious nodule. No pneumothorax. Upper Abdomen: No acute abnormality. Musculoskeletal: No fracture or acute finding. No osteoblastic or osteolytic lesions. Review of the MIP images confirms the above findings. IMPRESSION: 1. No evidence of a pulmonary embolism. 2. Mild cardiomegaly. 3. Enlargement of the main pulmonary artery raising the possibility of pulmonary hypertension. 4. Trace pleural effusions but no convincing  pulmonary edema. 5. Lungs show chronic changes of interstitial fibrosis with emphysema and mild bronchiectasis. These findings are stable from the prior chest CT. 6. Aortic atherosclerosis. Aortic Atherosclerosis (ICD10-I70.0) and Emphysema (ICD10-J43.9). Electronically Signed   By: Lajean Manes M.D.   On: 08/16/2017 12:08   Dg Chest Portable 1 View  Result Date: 08/16/2017 CLINICAL DATA:  Dyspnea EXAM: PORTABLE CHEST 1 VIEW COMPARISON:  07/09/2015 chest radiograph. FINDINGS: Stable configuration of 2 lead left subclavian pacemaker. Stable cardiomediastinal silhouette with mild cardiomegaly. No pneumothorax. No pleural effusion. Moderate pulmonary edema. IMPRESSION: Moderate congestive heart failure. Electronically Signed   By: [name] M.D.   On: 08/16/2017 09:51    EKG:   Orders placed or performed during the hospital encounter of 08/16/17  . ED EKG within 10 minutes  . ED EKG within 10 minutes    ASSESSMENT AND PLAN:    1 acute on chronic hypoxic hypercapnic respiratory failure Most likely secondary to acute A. fib with RVR, probable pacemaker malfunction Off BiPAP Amiodarone drip  weaned off  CT chest negative for PE Negative troponins and acute MI ruled out clinical exam inconsistent with heart failure-suspect elevated BNP related to A. fib with RVR/ supplemental oxygen as needed with weaning as tolerated-on 3-4 L continuous at home Pulmonology is following  2 acute on chronic A. fib with RVR Status post pacer None known anticoagulation given history of intracranial hemorrhage Pacer interrogation as stated above Patient did not respond well to Cardizem infusion which was discontinued and eventually patient responded well to amiodarone GTT which is slowly weaned off. Metoprolol  will be continued Echocardiogram check TSH, and continue close medical monitoring  3 acute atypical chest pain Ongoing for approximately 5 days Suspected secondary to A. 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 Status post IVC filter placement Not on anticoagulation given history of intracranial hemorrhage  5 chronic morbid obesity Most likely secondary to excess calories Lifestyle modification recommended  6 chronic diabetes mellitus type 2 Sliding scale insulin with Accu-Cheks per routine, hold metformin  7 chronic diastolic congestive heart failure without exacerbation Stable Continue current regiment  8 COPD without exacerbation Stable Breathing treatments as needed  9 history of thyroid disease Check TSH level      All the records are reviewed and case discussed with Care Management/Social Workerr. Management plans discussed with the patient, family and they are in agreement.  CODE STATUS: fc   TOTAL TIME TAKING CARE OF THIS PATIENT:36   minutes.   POSSIBLE D/C IN 1-2  DAYS, DEPENDING ON CLINICAL CONDITION.  Note: This dictation was prepared with Dragon dictation along with smaller phrase technology. Any transcriptional errors that result from this process are unintentional.   Nicholes Mango M.D on 08/17/2017 at 3:15 PM  Between 7am to 6pm - Pager - [phone] After 6pm go to [email or web address] - password [email or web address] [facility]  [facility]  Office  [phone]  CC: Primary care physician; [name], MD

## 2017-08-17 NOTE — Progress Notes (Signed)
A&O,  Placed on Bipap for night.  Voided 350 ml during shift.  In no acute distress.  Will continue to monitor.

## 2017-08-18 ENCOUNTER — Encounter: Payer: Self-pay | Admitting: Internal Medicine

## 2017-08-18 DIAGNOSIS — J9601 Acute respiratory failure with hypoxia: Secondary | ICD-10-CM

## 2017-08-18 DIAGNOSIS — J9602 Acute respiratory failure with hypercapnia: Secondary | ICD-10-CM

## 2017-08-18 LAB — HEMOGLOBIN A1C
Hgb A1c MFr Bld: 5.6 % (ref 4.8–5.6)
Mean Plasma Glucose: 114 mg/dL

## 2017-08-18 LAB — GLUCOSE, CAPILLARY
Glucose-Capillary: 138 mg/dL — ABNORMAL HIGH (ref 65–99)
Glucose-Capillary: 128 mg/dL — ABNORMAL HIGH (ref 65–99)
Glucose-Capillary: 136 mg/dL — ABNORMAL HIGH (ref 65–99)
Glucose-Capillary: 164 mg/dL — ABNORMAL HIGH (ref 65–99)

## 2017-08-18 LAB — BASIC METABOLIC PANEL
Anion gap: 9 (ref 5–15)
Anion gap: 10 (ref 5–15)
BUN: 51 mg/dL — ABNORMAL HIGH (ref 6–20)
BUN: 52 mg/dL — ABNORMAL HIGH (ref 6–20)
CO2: 26 mmol/L (ref 22–32)
CO2: 24 mmol/L (ref 22–32)
Calcium: 8.4 mg/dL — ABNORMAL LOW (ref 8.9–10.3)
Calcium: 8.7 mg/dL — ABNORMAL LOW (ref 8.9–10.3)
Chloride: 100 mmol/L — ABNORMAL LOW (ref 101–111)
Chloride: 98 mmol/L — ABNORMAL LOW (ref 101–111)
Creatinine, Ser: 1.15 mg/dL — ABNORMAL HIGH (ref 0.44–1.00)
Creatinine, Ser: 1.06 mg/dL — ABNORMAL HIGH (ref 0.44–1.00)
GFR calc Af Amer: 53 mL/min — ABNORMAL LOW (ref >60)
GFR calc Af Amer: 58 mL/min — ABNORMAL LOW (ref >60)
GFR calc non Af Amer: 46 mL/min — ABNORMAL LOW (ref >60)
GFR calc non Af Amer: 50 mL/min — ABNORMAL LOW (ref >60)
Glucose, Bld: 173 mg/dL — ABNORMAL HIGH (ref 65–99)
Glucose, Bld: 133 mg/dL — ABNORMAL HIGH (ref 65–99)
Potassium: 4 mmol/L (ref 3.5–5.1)
Potassium: 4.1 mmol/L (ref 3.5–5.1)
Sodium: 135 mmol/L (ref 135–145)
Sodium: 132 mmol/L — ABNORMAL LOW (ref 135–145)

## 2017-08-18 LAB — ECHOCARDIOGRAM COMPLETE
Height: 61 in
Weight: 3432.12 oz

## 2017-08-18 LAB — MAGNESIUM: Magnesium: 2.7 mg/dL — ABNORMAL HIGH (ref 1.7–2.4)

## 2017-08-18 MED ORDER — APIXABAN 5 MG PO TABS
5.0000 mg | ORAL_TABLET | Freq: Two times a day (BID) | ORAL | Status: DC
  Administered 2017-08-18 – 2017-08-28 (×21): 5 mg via ORAL
  Filled 2017-08-18 (×21): qty 1

## 2017-08-18 MED ORDER — CONIVAPTAN 20 MG/DEXTROSE 5 % 100ML IV SOLN
0.8330 mg/h | Freq: Once | INTRAVENOUS | Status: AC
  Administered 2017-08-18: 0.833 mg/h via INTRAVENOUS
  Filled 2017-08-18: qty 100

## 2017-08-18 NOTE — Discharge Instructions (Signed)
Atrial Fibrillation °Atrial fibrillation is a type of heartbeat that is irregular or fast (rapid). If you have this condition, your heart keeps quivering in a weird (chaotic) way. This condition can make it so your heart cannot pump blood normally. Having this condition gives a person more risk for stroke, heart failure, and other heart problems. There are different types of atrial fibrillation. Talk with your doctor to learn about the type that you have. °Follow these instructions at home: °· Take over-the-counter and prescription medicines only as told by your doctor. °· If your doctor prescribed a blood-thinning medicine, take it exactly as told. Taking too much of it can cause bleeding. If you do not take enough of it, you will not have the protection that you need against stroke and other problems. °· Do not use any tobacco products. These include cigarettes, chewing tobacco, and e-cigarettes. If you need help quitting, ask your doctor. °· If you have apnea (obstructive sleep apnea), manage it as told by your doctor. °· Do not drink alcohol. °· Do not drink beverages that have caffeine. These include coffee, soda, and tea. °· Maintain a healthy weight. Do not use diet pills unless your doctor says they are safe for you. Diet pills may make heart problems worse. °· Follow diet instructions as told by your doctor. °· Exercise regularly as told by your doctor. °· Keep all follow-up visits as told by your doctor. This is important. °Contact a doctor if: °· You notice a change in the speed, rhythm, or strength of your heartbeat. °· You are taking a blood-thinning medicine and you notice more bruising. °· You get tired more easily when you move or exercise. °Get help right away if: °· You have pain in your chest or your belly (abdomen). °· You have sweating or weakness. °· You feel sick to your stomach (nauseous). °· You notice blood in your throw up (vomit), poop (stool), or pee (urine). °· You are short of  breath. °· You suddenly have swollen feet and ankles. °· You feel dizzy. °· Your suddenly get weak or numb in your face, arms, or legs, especially if it happens on one side of your body. °· You have trouble talking, trouble understanding, or both. °· Your face or your eyelid droops on one side. °These symptoms may be an emergency. Do not wait to see if the symptoms will go away. Get medical help right away. Call your local emergency services (911 in the [place] Do not drive yourself to the hospital. °This information is not intended to replace advice given to you by your health care provider. Make sure you discuss any questions you have with your health care provider. °Document Released: 03/18/2008 Document Revised: 11/15/2015 Document Reviewed: 10/04/2014 °Elsevier Interactive Patient Education © 2018 [employer] ° °

## 2017-08-18 NOTE — Evaluation (Deleted)
Physical Therapy Evaluation Patient Details Name: Debra Saunders MRN: [id number] DOB: July 12, 1945 Today's Date: 08/18/2017   History of Present Illness  72 y/o female with ~week of shortness of breath and chest pain.  She was very tachcardic, 100-120bpm t/o most of PT exam. Pt uses 4 liters O2 at home 24/7.    Clinical Impression  Pt showed great effort with PT exam but was very quick to fatigue and clearly exhausted with 40 ft of ambulation on 2 liters O2.  Her HR and O2 stayed stable (100-120 bpm most of the time and O2 appeared to stay ~90 +/- a few points) but she is not at all at her baseline and would not be safe to return home alone at this time and will benefit from short term rehab.     Follow Up Recommendations SNF    Equipment Recommendations  None recommended by PT    Recommendations for Other Services       Precautions / Restrictions Precautions Precautions: Fall Restrictions Weight Bearing Restrictions: No      Mobility  Bed Mobility Overal bed mobility: Modified Independent             General bed mobility comments: reliant on the rail to get to sitting, but able to do so w/o assist  Transfers Overall transfer level: Modified independent Equipment used: Rolling walker (2 wheeled)             General transfer comment: Pt able to rise and get back to sitting w/o assist.  Reports she has a step at home to get in/out of bed, encouraged trying to avoid the need for step (lowering bed, etc)  Ambulation/Gait Ambulation/Gait assistance: Min assist Ambulation Distance (Feet): 40 Feet Assistive device: Rolling walker (2 wheeled)       General Gait Details: Pt was able to do 2 small loops in the room with heavy reliance on the walker and 2 liters O2.  She did become very tired and short of breath with the effort but was safe.  Her HR did not increase signficantly, had some issues reading O2 sats but she seemed to stay 88-92% most of the time.  Stairs             Wheelchair Mobility    Modified Rankin (Stroke Patients Only)       Balance Overall balance assessment: Modified Independent                                           Pertinent Vitals/Pain Pain Assessment: (mild chest pain, "better than it has been")    Home Living Family/patient expects to be discharged to:: Skilled nursing facility Living Arrangements: Alone                    Prior Function Level of Independence: Independent with assistive device(s)         Comments: Pt only out of the house for MD appointments states family checks in occasionally.  Has groceries delivered.     Hand Dominance        Extremity/Trunk Assessment   Upper Extremity Assessment Upper Extremity Assessment: Generalized weakness    Lower Extremity Assessment Lower Extremity Assessment: Generalized weakness(L LE swollen)       Communication   Communication: No difficulties  Cognition Arousal/Alertness: Awake/alert Behavior During Therapy: WFL for tasks assessed/performed Overall Cognitive Status: Within Functional  Limits for tasks assessed                                        General Comments      Exercises     Assessment/Plan    PT Assessment Patient needs continued PT services  PT Problem List Decreased strength;Decreased range of motion;Decreased activity tolerance;Decreased balance;Decreased mobility;Decreased coordination;Decreased knowledge of use of DME;Decreased safety awareness;Cardiopulmonary status limiting activity       PT Treatment Interventions DME instruction;Gait training;Stair training;Functional mobility training;Therapeutic activities;Therapeutic exercise;Balance training;Cognitive remediation;Patient/family education    PT Goals (Current goals can be found in the Care Plan section)  Acute Rehab PT Goals Patient Stated Goal: Get stronger so she can go home PT Goal Formulation: With patient Time  For Goal Achievement: 09/01/17 Potential to Achieve Goals: Good    Frequency Min 2X/week   Barriers to discharge        Co-evaluation               AM-PAC PT "6 Clicks" Daily Activity  Outcome Measure Difficulty turning over in bed (including adjusting bedclothes, sheets and blankets)?: A Little Difficulty moving from lying on back to sitting on the side of the bed? : A Little Difficulty sitting down on and standing up from a chair with arms (e.g., wheelchair, bedside commode, etc,.)?: A Little Help needed moving to and from a bed to chair (including a wheelchair)?: A Little Help needed walking in hospital room?: A Lot Help needed climbing 3-5 steps with a railing? : A Lot 6 Click Score: 16    End of Session Equipment Utilized During Treatment: Gait belt;Oxygen(2 liters, she reports using 4 liters at home 24/7) Activity Tolerance: Patient limited by fatigue Patient left: with bed alarm set;with call bell/phone within reach Nurse Communication: Mobility status PT Visit Diagnosis: Muscle weakness (generalized) (M62.81);Difficulty in walking, not elsewhere classified (R26.2)    Time: 4481-8563 PT Time Calculation (min) (ACUTE ONLY): 25 min   Charges:   PT Evaluation $PT Eval Low Complexity: 1 Low     PT G CodesKreg Shropshire, DPT 08/18/2017, 5:00 PM

## 2017-08-18 NOTE — Progress Notes (Signed)
Spoke to Dr. Caryl Comes- stated that I held metoprolol due to patient's systolic blood pressure in the 90's.  Though heartrate is still as high as 120's -130's Afib.  He stated to start eliquis and also that the plan for patient is cardioversion on Thursday this week.  He stated that patient will continue to have high heartrate of that level until cardioversion and he is ok with that.

## 2017-08-18 NOTE — Consult Note (Signed)
  [facility] Consultation   Name: Debra Saunders MRN: [id number] DOB: [birth year]    ADMISSION DATE:  08/16/2017 CONSULTATION DATE: 08/16/2017  REFERRING MD :  Dr. Syble Creek  CHIEF COMPLAINT: Shortness of breath   HISTORY OF PRESENT ILLNESS: Debra Saunders is a 72 year old female with a past medical history remarkable for hypertension, hyperlipidemia, obesity/obstructive sleep apnea, deep venous thrombosis, was not a anticoagulation candidate, status post filter placement, COPD, chronic respiratory failure, diastolic heart dysfunction, renal failure, type 2 diabetes, thyroid nodule, presented with 1 week of increasing shortness of breath.  Her dyspnea was worse with exertion, chest discomfort was also noted in her mid chest area approximately 5-day duration, in the emergency department patient was noted to be in atrial fibrillation with rapid ventricular response in the 160s.  Chest x-ray was remarkable for pulmonary edema pattern and patient was in respiratory distress.  She was placed on a Cardizem infusion and also started on BiPAP.  Pertinent labs reveal a BNP at 655, BUN was 45 with creatinine 1.36, she arrives in the intensive care unit still complaining of some shortness of breath.  Chest CT scan shows pulmonary vascular enlargement consistent with pulmonary hypertension, bilateral pleural effusions right greater than left with interstitial congestion consistent with pulmonary edema.  Calcifications noted in the mediastinum which represent old granulomatous disease.  SUBJECTIVE Patient resp status slowly improving Wore biPAP last night Plan to use biPAP at night       VITAL SIGNS: Temp:  [97.5 F (36.4 C)-98.3 F (36.8 C)] 98.3 F (36.8 C) (02/26 0100) Pulse Rate:  [77-115] 94 (02/26 0600) Resp:  [14-36] 14 (02/26 0600) BP: (92-132)/(66-102) 100/76 (02/26 0600) SpO2:  [94 %-100 %] 99 % (02/26 0600) Weight:  [216 lb 4.3 oz (98.1 kg)] 216 lb 4.3 oz (98.1 kg)  (02/26 0458)   ASSESSMENT/PLAN   Respiratory failure.  Patient with congestive heart failure requiring noninvasive ventilation.  She does have a history of COPD but is not actively wheezing at this time.  Agree with diuresis as tolerated  Rapid atrial fibrillation.  Has not responded to Cardizem infusion will bolus and drip amiodarone and DC Cardizem.  Will use Lopressor as needed.  Obtain 12-lead EKG, serial cardiac enzymes and echocardiography.  Renal insufficiency.  Elevated BUN/creatinine ratio may reflect decreased effective renal plasma flow.  Ok to transfer to gen med floor    Debra Saunders Debra Saunders, M.D.  [facility]  Medical Director [place] Director [facility] Cardio-Pulmonary Department

## 2017-08-18 NOTE — Evaluation (Signed)
Physical Therapy Evaluation Patient Details Name: Debra Saunders MRN: [id number] DOB: 1945-07-09 Today's Date: 08/18/2017   History of Present Illness  72 y/o female with ~week of shortness of breath and chest pain.  She was very tachcardic, 100-120bpm t/o most of PT exam. Pt uses 4 liters O2 at home 24/7.    Clinical Impression  Pt showed great effort with PT exam but was very quick to fatigue and clearly exhausted with 40 ft of ambulation on 2 liters O2.  Her HR and O2 stayed relatively stable (100-120 bpm most of the time and O2 appeared to stay ~90 +/- a few points) but she is not at all at her baseline and would not be safe to return home alone at this time and will benefit from short term rehab.     Follow Up Recommendations SNF    Equipment Recommendations  None recommended by PT    Recommendations for Other Services       Precautions / Restrictions Precautions Precautions: Fall Restrictions Weight Bearing Restrictions: No      Mobility  Bed Mobility Overal bed mobility: Needs Assistance Bed Mobility: Supine to Sit;Sit to Supine     Supine to sit: Min guard Sit to supine: Min assist   General bed mobility comments: reliant on the rail to get to sitting, but able to do so w/o assist.  She did need assist with getting LEs back into the bed.  Transfers Overall transfer level: Modified independent Equipment used: Rolling walker (2 wheeled) Transfers: Sit to/from Stand Sit to Stand: Min guard         General transfer comment: Pt able to rise and get back to sitting w/o assist.  Reports she has a step at home to get in/out of bed, encouraged trying to avoid the need for step (lowering bed, etc)  Ambulation/Gait Ambulation/Gait assistance: Min assist Ambulation Distance (Feet): 40 Feet Assistive device: Rolling walker (2 wheeled)       General Gait Details: Pt was able to do 2 small loops in the room with heavy reliance on the walker and 2 liters O2.  She did  become very tired and short of breath with the effort but was safe.  Her HR did not increase signficantly, had some issues reading O2 sats but she seemed to stay 88-92% most of the time.  Stairs            Wheelchair Mobility    Modified Rankin (Stroke Patients Only)       Balance Overall balance assessment: Modified Independent                                           Pertinent Vitals/Pain Pain Assessment: (mild chest pain, "better than it has been")    Home Living Family/patient expects to be discharged to:: Skilled nursing facility Living Arrangements: Alone                    Prior Function Level of Independence: Independent with assistive device(s)         Comments: Pt only out of the house for MD appointments states family checks in occasionally.  Has groceries delivered.     Hand Dominance        Extremity/Trunk Assessment   Upper Extremity Assessment Upper Extremity Assessment: Generalized weakness    Lower Extremity Assessment Lower Extremity Assessment: Generalized weakness(L LE  swollen)       Communication   Communication: No difficulties  Cognition Arousal/Alertness: Awake/alert Behavior During Therapy: WFL for tasks assessed/performed Overall Cognitive Status: Within Functional Limits for tasks assessed                                        General Comments      Exercises     Assessment/Plan    PT Assessment Patient needs continued PT services  PT Problem List Decreased strength;Decreased range of motion;Decreased activity tolerance;Decreased balance;Decreased mobility;Decreased coordination;Decreased knowledge of use of DME;Decreased safety awareness;Cardiopulmonary status limiting activity       PT Treatment Interventions DME instruction;Gait training;Stair training;Functional mobility training;Therapeutic activities;Therapeutic exercise;Balance training;Cognitive  remediation;Patient/family education    PT Goals (Current goals can be found in the Care Plan section)  Acute Rehab PT Goals Patient Stated Goal: Get stronger so she can go home PT Goal Formulation: With patient Time For Goal Achievement: 09/01/17 Potential to Achieve Goals: Good    Frequency Min 2X/week   Barriers to discharge        Co-evaluation               AM-PAC PT "6 Clicks" Daily Activity  Outcome Measure Difficulty turning over in bed (including adjusting bedclothes, sheets and blankets)?: A Little Difficulty moving from lying on back to sitting on the side of the bed? : A Little Difficulty sitting down on and standing up from a chair with arms (e.g., wheelchair, bedside commode, etc,.)?: A Little Help needed moving to and from a bed to chair (including a wheelchair)?: A Little Help needed walking in hospital room?: A Lot Help needed climbing 3-5 steps with a railing? : A Lot 6 Click Score: 16    End of Session Equipment Utilized During Treatment: Gait belt;Oxygen(2 liters, she reports using 4 liters at home 24/7) Activity Tolerance: Patient limited by fatigue Patient left: with bed alarm set;with call bell/phone within reach Nurse Communication: Mobility status PT Visit Diagnosis: Muscle weakness (generalized) (M62.81);Difficulty in walking, not elsewhere classified (R26.2)    Time: 7342-8768 PT Time Calculation (min) (ACUTE ONLY): 25 min   Charges:   PT Evaluation $PT Eval Low Complexity: 1 Low     PT G CodesKreg Shropshire, DPT 08/18/2017, 5:28 PM

## 2017-08-18 NOTE — Progress Notes (Signed)
Pharmacy Electrolyte Monitoring Consult:  Pharmacy consulted to assist in monitoring and replacing electrolytes in this 72 y.o. female admitted on 08/16/2017 with Shortness of Breath  Patient currently ordered furosemide 20mg  IV Q12hr and potassium 58mEq PO BID.   Labs:  Sodium (mmol/L)  Date Value  08/18/2017 132 (L)  12/28/2013 135 (L)   Potassium (mmol/L)  Date Value  08/18/2017 4.1  12/28/2013 3.2 (L)   Magnesium (mg/dL)  Date Value  08/18/2017 2.7 (H)  11/18/2013 1.7 (L)   Phosphorus (mg/dL)  Date Value  02/08/2013 2.1 (L)   Calcium (mg/dL)  Date Value  08/18/2017 8.7 (L)   Calcium, Total (mg/dL)  Date Value  12/28/2013 9.0   Albumin (g/dL)  Date Value  08/16/2017 3.8  08/16/2013 3.5    Plan: Will continue potassium 69mEq PO BID. Will recheck electrolytes with am labs.   Pharmacy will continue to monitor and adjust per consult.   Simpson,Michael L 08/18/2017 10:44 PM

## 2017-08-18 NOTE — Progress Notes (Signed)
[facility] [place] NAME: Debra Saunders    MR#:  [id number]  DATE OF BIRTH:  1945-09-08  SUBJECTIVE:  CHIEF COMPLAINT: Patient shortness of breath improved and chest pain resolved.  In atrial fibrillation with RVR heart rate fluctuating between 110-130.  Sister at bedside  REVIEW OF SYSTEMS:  CONSTITUTIONAL: No fever, fatigue or weakness.  EYES: No blurred or double vision.  EARS, NOSE, AND THROAT: No tinnitus or ear pain.  RESPIRATORY: No cough, shortness of breath, wheezing or hemoptysis.  CARDIOVASCULAR: No chest pain, orthopnea, edema.  GASTROINTESTINAL: No nausea, vomiting, diarrhea or abdominal pain.  GENITOURINARY: No dysuria, hematuria.  ENDOCRINE: No polyuria, nocturia,  HEMATOLOGY: No anemia, easy bruising or bleeding SKIN: No rash or lesion. MUSCULOSKELETAL: No joint pain or arthritis.   NEUROLOGIC: No tingling, numbness, weakness.  PSYCHIATRY: No anxiety or depression.   DRUG ALLERGIES:   Allergies  Allergen Reactions  . Aspirin Hives and Other (See Comments)    Pt states that she is unable to take because she had bleeding in her brain.    . Other Other (See Comments)    Pt states that she is unable to take blood thinners because she had bleeding in her brain.   [name] Penicillins Hives, Shortness Of Breath, Swelling and Other (See Comments)    Has patient had a PCN reaction causing immediate rash, facial/tongue/throat swelling, SOB or lightheadedness with hypotension: Yes Has patient had a PCN reaction causing severe rash involving mucus membranes or skin necrosis: No Has patient had a PCN reaction that required hospitalization No Has patient had a PCN reaction occurring within the last 10 years: No If all of the above answers are "NO", then may proceed with Cephalosporin use.    VITALS:  Blood pressure 99/69, pulse 88, temperature (!) 97.4 F (36.3 C), temperature source Oral, resp. rate (!) 22, height 5\' 1"  (1.549  m), weight 98.1 kg (216 lb 4.3 oz), SpO2 97 %.  PHYSICAL EXAMINATION:  GENERAL:  72 y.o.-year-old patient lying in the bed with no acute distress.  EYES: Pupils equal, round, reactive to light and accommodation. No scleral icterus. Extraocular muscles intact.  HEENT: Head atraumatic, normocephalic. Oropharynx and nasopharynx clear.  NECK:  Supple, no jugular venous distention. No thyroid enlargement, no tenderness.  LUNGS: Normal breath sounds bilaterally, no wheezing, rales,rhonchi or crepitation. No use of accessory muscles of respiration.  CARDIOVASCULAR: Irregularly irregular l. No murmurs, rubs, or gallops.  ABDOMEN: Soft, nontender, nondistended. Bowel sounds present. EXTREMITIES: No pedal edema, cyanosis, or clubbing.  NEUROLOGIC: Cranial nerves II through XII are intact.  Sensation intact. Gait not checked.  PSYCHIATRIC: The patient is alert and oriented x 3.  SKIN: No obvious rash, lesion, or ulcer.    LABORATORY PANEL:   CBC Recent Labs  Lab 08/17/17 0435  WBC 5.4  HGB 10.6*  HCT 33.3*  PLT 219   ------------------------------------------------------------------------------------------------------------------  Chemistries  Recent Labs  Lab 08/16/17 1200  08/18/17 0418  NA  --    < > 135  K  --    < > 4.0  CL  --    < > 100*  CO2  --    < > 26  GLUCOSE  --    < > 173*  BUN  --    < > 51*  CREATININE  --    < > 1.15*  CALCIUM  --    < > 8.4*  MG  --    < >  2.7*  AST 25  --   --   ALT 21  --   --   ALKPHOS 80  --   --   BILITOT 0.7  --   --    < > = values in this interval not displayed.   ------------------------------------------------------------------------------------------------------------------  Cardiac Enzymes Recent Labs  Lab 08/17/17 0435  TROPONINI <0.03   ------------------------------------------------------------------------------------------------------------------  RADIOLOGY:  No results found.  EKG:   Orders placed or performed  during the hospital encounter of 08/16/17  . ED EKG within 10 minutes  . ED EKG within 10 minutes    ASSESSMENT AND PLAN:    1 acute on chronic hypoxic hypercapnic respiratory failure Most likely secondary to acute A. fib with RVR, probable pacemaker malfunction Off BiPAP Amiodarone drip  weaned off Cardiology is planning to do cardioversion on Thursday for A. fib A. fib with RVR We might consider digoxin as needed basis for rapid ventricular rate  CT chest negative for PE Negative troponins and acute MI ruled out clinical exam inconsistent with heart failure-suspect elevated BNP related to A. fib with RVR/ supplemental oxygen as needed with weaning as tolerated-on 3-4 L continuous at home Pulmonology is following  2 acute on chronic A. fib with RVR Status post pacer None known anticoagulation given history of intracranial hemorrhage Pacer interrogation as stated above Patient did not respond well to Cardizem infusion which was discontinued and eventually patient responded well to amiodarone GTT which is slowly weaned off. Metoprolol  will be continued Echocardiogram Normal TSH, and continue close medical monitoring  3 acute atypical chest pain-pain acute MI ruled out with negative cardiac enzymes Ongoing for approximately 5 days Suspected secondary to A. 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 post IVC filter placement Not on anticoagulation given history of intracranial hemorrhage  5 chronic morbid obesity Most likely secondary to excess calories Lifestyle modification recommended  6 chronic diabetes mellitus type 2 Hemoglobin A1c 5.6 Sliding scale insulin with Accu-Cheks per routine, hold metformin  7  chronic diastolic congestive heart failure without exacerbation Stable Continue current regiment  8 COPD without exacerbation Stable Breathing treatments as needed  9 history of thyroid disease Normal TSH level      All the records are reviewed and case discussed with Care Management/Social Workerr. Management plans discussed with the patient, family and they are in agreement.  CODE STATUS: fc   TOTAL TIME TAKING CARE OF THIS PATIENT:36   minutes.   POSSIBLE D/C IN 1-2  DAYS, DEPENDING ON CLINICAL CONDITION.  Note: This dictation was prepared with Dragon dictation along with smaller phrase technology. Any transcriptional errors that result from this process are unintentional.   Nicholes Mango M.D on 08/18/2017 at 12:37 PM  Between 7am to 6pm - Pager - [phone] After 6pm go to [email or web address] - password [email or web address] [facility]  [facility]  Office  [phone]  CC: Primary care physician; [name], MD

## 2017-08-18 NOTE — Progress Notes (Signed)
   08/18/17 1320  Clinical Encounter Type  Visited With Patient and family together  Visit Type Follow-up  [facility] followed up with patient, let her know that chaplain had been unable to reach pastor.  Patient reported that her sister had contacted him and church family was now aware.  Conversation around decisions she and children have made as well as how her health is progressing and what she is hoping for.

## 2017-08-18 NOTE — Consult Note (Signed)
ELECTROPHYSIOLOGY CONSULT NOTE  Patient ID: Debra Saunders, MRN: [id number], DOB/AGE: November 23, 1945 72 y.o. Admit date: 08/16/2017 Date of Consult: 08/18/2017  Primary Physician: [name], MD Primary Cardiologist: MA/SK Patient Profile:   Debra Saunders is a 72 y.o. female with a hx of nonobstructive CAD by prior LHC, chronic diastolic CHF, symptomatic bradycardia s/p PPM, frequent PVCs, chronic respiratory failure with hypoxia on home oxygen secondary to COPD, HTN, HLD, DM2, prior SAH in the setting of [place] for prior DVT now s/p filter placement, chronic right-sided atypical chest pain, lymphedema, stroke, OSA, and obesity who is being seen today for the evaluation of new onset Afib with RVR at the request of Dr. Saunders Revel   She is seen today at the request of Dr End regarding anticoagulation    HPI Debra Saunders is a 72 y.o. female  Admitted with congestive heart failure and rapid atrial fibrillation.  Rates initially in the 150s and decreased to the 100s with rate control.  She has a history of chronotropic incompetence with previously implanted Chiropractor.  She has a history of symptomatic PVCs.  Echocardiogram 8/18 showed normal left ventricular function and mildly dilated left atrium.  Repeat echocardiogram is pending.  2013 nonobstructive coronary disease; Myoview 2015 was negative.  History of watery diarrhea on presentation.  She was hypomagnesemic   In 2014 she presented with chest pain.  Dopplers demonstrated a DVT and a CTA showed "bilateral small PE ".  She was started on Xarelto.  A few days later she was readmitted to hospital with "questionable PEA arrest..  A CT scan of the head showed a small frontal SAH.  An IVC filter was placed.  Transfer to [facility] at that time she then had recurrent episodes of unresponsiveness were felt to have seizures.  Neurosurgical note dated 01/19/13 reads "minimal post traumatic SAH without parenchymal  involvement or mass-effect. Anticoagulation be held at least 2 weeks. assessment regarding fall risk versus relative benefit of further anticoagulation.     Over the past 5 days leading up to her admission she had noted increased SOB with exertion along with right-sided chest discomfort that was unchanged from her prior atypical chest pain. This was preceded with the acute onset of watery diarrhea. Upon her arrival to [facility] she was noted to be in new onset Afib with RVR with heart rates into the 150s bpm. She was initially placed on IV Cardizem without much improvement in her heart rate. She has subsequently been placed on IV amiodarone. She has not been started on anticoagulation. CXR showed moderate CHF. CTA chest was negative for PE with mild cardiomegaly, possible pulmonary HTN, trace pleural effusions without pulmonary edema, interstitial fibrosis. Labs showed troponin negative x 3, BNP 655, K+ 3.7-->4.0, SCr 1.36-->1.25, Na 139-->133, WBC 8.6, HGB 11.8, PLT 230, TSH normal, blood culture no growth to date x 2, A1c pending, LDL 37. BP stable. Weight 220 pounds. Oxygen saturation 97% on BiPAP. She remains in Afib with RVR with heart rates in the low 100s to 120s bpm on amiodarone infusion.   Thromboembolic risk factors ( age -46 , HTN-1,  *, DM-1, Gender-1) for a CHADSVASc Score of 4   Past Medical History:  Diagnosis Date  . Acute renal failure ([place])   . Arthritis   . Chest pain    a. 01/2012 Cath: nonobs dzs;  c. 04/2014 MV: No ischemia.  . Chronic back pain   . Chronic diastolic CHF (congestive heart failure) (  [place]    a. Dx 2005 @ [facility];  b. 02/2014 Echo: Ef 55-60%, no rwma, mild MR, PASP 77mHg.  .Marland KitchenChronic respiratory failure (HCC)    a. on home O2  . COPD (chronic obstructive pulmonary disease) ([place]    a. Home O2 - 3lpm  . Gastritis   . History of intracranial hemorrhage    a. 11[place]  .Marland KitchenHyperlipidemia   . Hypertension   . Lipoma  of back   . Lymphedema    a. Chronic LLE edema.  . Obesity   . Sepsis with metabolic encephalopathy ([place]   . Sleep apnea   . Streptococcal bacteremia   . Stroke ([place]   . Symptomatic bradycardia    a. s/p BSX PPM.  . Thyroid disorder   . Thyroid nodule   . Type II diabetes mellitus ([place]   . Urinary incontinence       Surgical History:  Past Surgical History:  Procedure Laterality Date  . ABDOMINAL HYSTERECTOMY  1969  . CARDIAC CATHETERIZATION  2013   [place] No obstructive CAD: Only 20% ostial left circumflex.  . CHOLECYSTECTOMY    . CYST REMOVAL TRUNK  2002   BACK  . INSERT / REPLACE / REMOVE PACEMAKER    . lipoma removal  2014   back  . PACEMAKER INSERTION  [address]Scientific dual chamber pacemaker implanted by Dr KCaryl Comesfor symptomatic bradycardia  . PERMANENT PACEMAKER INSERTION N/A 01/09/2014   Procedure: PERMANENT PACEMAKER INSERTION;  Surgeon: SDeboraha Sprang MD;  [facility];  Service: Cardiovascular;  Laterality: N/A;  . TRIGGER FINGER RELEASE    . VAGINAL HYSTERECTOMY       Home Meds: Prior to Admission medications   Medication Sig Start Date End Date Taking? Authorizing Provider  acetaminophen (TYLENOL) 325 MG tablet Take 325-650 mg by mouth every 6 (six) hours as needed for mild pain. Reported on 10/10/2015   Yes [provider]  albuterol (PROVENTIL) (2.5 MG/3ML) 0.083% nebulizer solution Take 3 mLs (2.5 mg total) by nebulization every 6 (six) hours as needed for wheezing or shortness of breath. 07/16/15  Yes RLaverle Hobby MD  CARTIA XT 240 MG 24 hr capsule TAKE 1 CAPSULE BY MOUTH ONCE DAILY. PATIENT NEEDS TO SCHEDULE AN APPOINTMENT WITH DR. KCaryl ComesFOR FURTHER REFILLS. [phone]/21/19  Yes KDeboraha Sprang MD  gabapentin (NEURONTIN) 100 MG capsule TAKE 2 CAPSULES BY MOUTH THREE TIMES DAILY 08/12/17  Yes SLeone Haven MD  hydrochlorothiazide (HYDRODIURIL) 25 MG tablet TAKE 1/2 (ONE-HALF) TABLET BY MOUTH ONCE DAILY  04/27/17  Yes SLeone Haven MD  KLOR-CON M20 20 MEQ tablet TAKE 1 TABLET BY MOUTH TWICE DAILY 02/06/17  Yes SLeone Haven MD  losartan (COZAAR) 50 MG tablet Take 1 tablet (50 mg total) by mouth daily. 11/18/16 08/16/17 Yes BTheora Gianotti NP  metFORMIN (GLUCOPHAGE) 500 MG tablet TAKE 2 TABLETS BY MOUTH TWICE DAILY WITH A MEAL 06/19/17  Yes SLeone Haven MD  metoprolol tartrate (LOPRESSOR) 50 MG tablet Take 1 tablet (50 mg total) by mouth 2 (two) times daily. 12/15/16 08/16/17 Yes BTheora Gianotti NP  OXYGEN Inhale into the lungs. 3 liters   Yes [provider]  rosuvastatin (CRESTOR) 10 MG tablet Take 1 tablet (10 mg total) by mouth daily. 12/29/16  Yes SLeone Haven MD  sildenafil (REVATIO) 20 MG tablet Take 20 mg by mouth 3 (three) times daily.  05/20/17  Yes [provider]  torsemide (DEMADEX) 20 MG tablet Take 1 tablet (20 mg total) by mouth 2 (two) times daily. Patient taking differently: Take 20 mg by mouth daily as needed.  10/19/15  Yes Jackolyn Confer, MD  Blood Glucose Monitoring Suppl (TRUE METRIX AIR GLUCOSE METER) w/Device KIT USE AS DIRECTED 01/02/16   Jackolyn Confer, MD  Dulaglutide ([name]) 3.14 HF/0.2OV SOPN Inject 0.75 mg into the skin once a week. 12/29/16   [name], MD  EASY TOUCH INSULIN SYRINGE 29G X 1/2" 1 ML MISC USE AS DIRECTED WITH  LEVEMIR  INSULIN 12/24/15   Jackolyn Confer, MD  glucose blood test strip Use as instructed to check blood glucose three times a day. e11.8 06/09/17   [name], MD  Lancets MISC Use as directed to check blood sugar three times daily e11.8 10/31/15   Jackolyn Confer, MD  meclizine (ANTIVERT) 25 MG tablet Take 25 mg by mouth daily as needed for dizziness. Reported on 10/10/2015    [provider]  Menthol, Topical Analgesic, (BIOFREEZE EX) Apply 1 application topically as needed (for pain).     [provider]  ondansetron (ZOFRAN) 8 MG tablet  Take 1 tablet (8 mg total) by mouth every 8 (eight) hours as needed for nausea or vomiting. 09/25/14   Jackolyn Confer, MD  oxyCODONE-acetaminophen (PERCOCET/ROXICET) 5-325 MG tablet Take 1 tablet by mouth every 8 (eight) hours as needed for severe pain. 01/07/16   Jackolyn Confer, MD  polyethylene glycol (MIRALAX / GLYCOLAX) packet Take 17 g by mouth daily as needed for moderate constipation.    [provider]  promethazine (PHENERGAN) 25 MG tablet Take 25 mg by mouth every 4 (four) hours as needed for nausea or vomiting. Reported on 10/10/2015    [provider]    Inpatient Medications:  . Chlorhexidine Gluconate Cloth  6 each Topical Q0600  . furosemide  20 mg Intravenous BID  . gabapentin  200 mg Oral TID  . heparin  5,000 Units Subcutaneous Q8H  . insulin aspart  0-20 Units Subcutaneous TID WC  . insulin aspart  0-5 Units Subcutaneous QHS  . metoprolol tartrate  50 mg Oral Q6H  . mupirocin ointment  1 application Nasal BID  . pantoprazole  40 mg Oral Daily  . potassium chloride SA  20 mEq Oral BID  . rosuvastatin  10 mg Oral Daily  . sildenafil  20 mg Oral TID     Allergies:  Allergies  Allergen Reactions  . Aspirin Hives and Other (See Comments)    Pt states that she is unable to take because she had bleeding in her brain.    . Other Other (See Comments)    Pt states that she is unable to take blood thinners because she had bleeding in her brain.   [name] Penicillins Hives, Shortness Of Breath, Swelling and Other (See Comments)    Has patient had a PCN reaction causing immediate rash, facial/tongue/throat swelling, SOB or lightheadedness with hypotension: Yes Has patient had a PCN reaction causing severe rash involving mucus membranes or skin necrosis: No Has patient had a PCN reaction that required hospitalization No Has patient had a PCN reaction occurring within the last 10 years: No If all of the above answers are "NO", then may proceed with Cephalosporin  use.    Social History   Socioeconomic History  . Marital status: Divorced    Spouse name: Not on file  .  Number of children: Not on file  . Years of education: Not on file  . Highest education level: Not on file  Social Needs  . Financial resource strain: Not on file  . Food insecurity - worry: Not on file  . Food insecurity - inability: Not on file  . Transportation needs - medical: Not on file  . Transportation needs - non-medical: Not on file  Occupational History  . Occupation: Retired- factory work    Comment: exposed to Pharmacist, community  Tobacco Use  . Smoking status: Former Smoker    Packs/day: 0.30    Years: 2.00    Pack years: 0.60    Types: Cigarettes    Last attempt to quit: 06/23/1990    Years since quitting: 27.1  . Smokeless tobacco: Never Used  Substance and Sexual Activity  . Alcohol use: No  . Drug use: No  . Sexual activity: Not on file  Other Topics Concern  . Not on file  Social History Narrative   Lives in [place] with daughter. Has 4 children. No pets.      Work - Restaurant manager, fast food, retired      Diet - regular     Family History  Problem Relation Age of Onset  . Heart disease Mother   . Hypertension Mother   . COPD Mother        was a smoker  . Thyroid disease Mother   . Hypertension Father   . Hypertension Sister   . Thyroid disease Sister   . Hypertension Sister   . Thyroid disease Sister   . Breast cancer Maternal Aunt   . Kidney disease Neg Hx   . Bladder Cancer Neg Hx      ROS:  Please see the history of present illness.     All other systems reviewed and negative.    Physical Exam:  Blood pressure 97/82, pulse 78, temperature (!) 97.4 F (36.3 C), temperature source Oral, resp. rate (!) 23, height _0  (1.549 m), weight 216 lb 4.3 oz (98.1 kg), SpO2 99 %. General: Well developed, well nourished female in no acute distress wearing O2. Head: Normocephalic, atraumatic, sclera non-icteric, no xanthomas, nares are without  discharge. EENT: normal Lymph Nodes:  none Back: without scoliosis/kyphosis *, no CVA tendersness Neck: Negative for carotid bruits. JVD8-10 Lungs: Clear bilaterally to auscultation without wheezes, rales, or rhonchi. Breathing is unlabored. Heart: IRRR with 2*/6 systolic  murmur , rubs, or gallops appreciated. Abdomen: Soft, non-tender, non-distended with normoactive bowel sounds. No hepatomegaly. No rebound/guarding. No obvious abdominal masses. Msk:  Strength and tone appear normal for age. Extremities: No clubbing or cyanosis.  1+  edema.  Distal pedal pulses are 2+ and equal bilaterally. Skin: Warm and Dry Neuro: Alert and oriented X 3. CN III-XII intact Grossly normal sensory and motor function . Psych:  Responds to questions appropriately with a normal affect.      Labs: Cardiac Enzymes Recent Labs    08/16/17 0925 08/16/17 1649 08/16/17 2147 08/17/17 0435  TROPONINI <0.03 <0.03 <0.03 <0.03   CBC Lab Results  Component Value Date   WBC 5.4 08/17/2017   HGB 10.6 (L) 08/17/2017   HCT 33.3 (L) 08/17/2017   MCV 82.6 08/17/2017   PLT 219 08/17/2017   PROTIME: No results for input(s): LABPROT, INR in the last 72 hours. Chemistry  Recent Labs  Lab 08/16/17 1200  08/18/17 0418  NA  --    < > 135  K  --    < >  4.0  CL  --    < > 100*  CO2  --    < > 26  BUN  --    < > 51*  CREATININE  --    < > 1.15*  CALCIUM  --    < > 8.4*  PROT 8.0  --   --   BILITOT 0.7  --   --   ALKPHOS 80  --   --   ALT 21  --   --   AST 25  --   --   GLUCOSE  --    < > 173*   < > = values in this interval not displayed.   Lipids Lab Results  Component Value Date   CHOL 102 08/17/2017   HDL 52 08/17/2017   LDLCALC 37 08/17/2017   TRIG 67 08/17/2017   BNP Pro B Natriuretic peptide (BNP)  Date/Time Value Ref Range Status  06/22/2014 09:52 AM 45.0 0.0 - 100.0 pg/mL Final  05/01/2014 10:36 AM 44.0 0.0 - 100.0 pg/mL Final  07/28/2013 04:33 PM 28.0 0.0 - 100.0 pg/mL Final    Thyroid Function Tests: Recent Labs    08/16/17 1441  TSH 1.170      Miscellaneous No results found for: DDIMER  Radiology/Studies:  Ct Angio Chest Pe W And/or Wo Contrast  Result Date: 08/16/2017 CLINICAL DATA:  Patient states increased heart rate with SOB and chest pain today. Pt placed on BiPAP in ER. Hx DM, stroke, HTN, COPD, CHF, pacemaker insertion. Former smoker. EXAM: CT ANGIOGRAPHY CHEST WITH CONTRAST TECHNIQUE: Multidetector CT imaging of the chest was performed using the standard protocol during bolus administration of intravenous contrast. Multiplanar CT image reconstructions and MIPs were obtained to evaluate the vascular anatomy. CONTRAST:  26m ISOVUE-370 IOPAMIDOL (ISOVUE-370) INJECTION 76% COMPARISON:  Current chest radiograph.  Chest CT, 03/27/2015 FINDINGS: Cardiovascular: There is satisfactory opacification of the pulmonary arteries to the segmental level. There is no evidence of a pulmonary embolism. Heart is mildly enlarged. No pericardial effusion. No significant coronary artery calcifications. Main pulmonary artery is dilated to 4.1 cm. Aorta is normal in caliber. Atherosclerotic calcifications are noted along the thoracic aorta and origin of the left subclavian artery. Mediastinum/Nodes: No neck base or axillary masses or enlarged lymph nodes. There are calcified mediastinal nodes consistent with healed granulomatous disease. No mediastinal or hilar masses. No pathologically enlarged lymph nodes. Trachea is patent. Esophagus is unremarkable. Lungs/Pleura: Minimal pleural effusions. Lungs show chronic interstitial thickening with mild associated bronchiectasis, with an upper lobe predominance, similar to the prior CT. Emphysema, mostly paraseptal, is noted bilaterally most prominently at the lung bases. No convincing pneumonia or pulmonary edema. No lung mass or suspicious nodule. No pneumothorax. Upper Abdomen: No acute abnormality. Musculoskeletal: No fracture or acute  finding. No osteoblastic or osteolytic lesions. Review of the MIP images confirms the above findings. IMPRESSION: 1. No evidence of a pulmonary embolism. 2. Mild cardiomegaly. 3. Enlargement of the main pulmonary artery raising the possibility of pulmonary hypertension. 4. Trace pleural effusions but no convincing pulmonary edema. 5. Lungs show chronic changes of interstitial fibrosis with emphysema and mild bronchiectasis. These findings are stable from the prior chest CT. 6. Aortic atherosclerosis. Aortic Atherosclerosis (ICD10-I70.0) and Emphysema (ICD10-J43.9). Electronically Signed   By: DLajean ManesM.D.   On: 08/16/2017 12:08   Dg Chest Portable 1 View  Result Date: 08/16/2017 CLINICAL DATA:  Dyspnea EXAM: PORTABLE CHEST 1 VIEW COMPARISON:  07/09/2015 chest radiograph. FINDINGS: Stable configuration of 2 lead  left subclavian pacemaker. Stable cardiomediastinal silhouette with mild cardiomegaly. No pneumothorax. No pleural effusion. Moderate pulmonary edema. IMPRESSION: Moderate congestive heart failure. Electronically Signed   By: [name] M.D.   On: 08/16/2017 09:51   US Thyroid  Result Date: 08/04/2017 CLINICAL DATA:  72 year old female with a history of thyroid goiter EXAM: THYROID ULTRASOUND TECHNIQUE: Ultrasound examination of the thyroid gland and adjacent soft tissues was performed. COMPARISON:  No prior comparison FINDINGS: Parenchymal Echotexture: Moderately heterogenous Isthmus: 0.7 cm Right lobe: 3.6 cm x 2.0 cm x 1.7 cm Left lobe: 4.1 cm x 2.6 cm x 2.3 cm _________________________________________________________ Estimated total number of nodules >/= 1 cm: 3 Number of spongiform nodules >/=  2 cm not described below (TR1): 0 Number of mixed cystic and solid nodules >/= 1.5 cm not described below (TR2): 0 _________________________________________________________ Nodule # 1: Location: Right; Superior Maximum size: 0.6 cm; Other 2 dimensions: 0.5 cm x 0.4 cm Composition: cannot determine  (2) Echogenicity: hypoechoic (2) Shape: not taller-than-wide (0) Margins: smooth (0) Echogenic foci: none (0) ACR TI-RADS total points: 4. ACR TI-RADS risk category: TR4 (4-6 points). ACR TI-RADS recommendations: Nodule does not meet criteria for surveillance or biopsy _________________________________________________________ Nodule # 2: Location: Right; Inferior Maximum size: 0.8 cm; Other 2 dimensions: 0.6 cm x 0.5 cm Composition: cannot determine (2) Echogenicity: hypoechoic (2) Shape: not taller-than-wide (0) Margins: ill-defined (0) Echogenic foci: none (0) ACR TI-RADS total points: 4. ACR TI-RADS risk category: TR4 (4-6 points). ACR TI-RADS recommendations: Nodule does not meet criteria for surveillance or biopsy _________________________________________________________ Nodule # 3: Location: Left; Superior Maximum size: 1.5 cm; Other 2 dimensions: 1.5 cm x 1.2 cm Composition: mixed cystic and solid (1) Echogenicity: isoechoic (1) Shape: not taller-than-wide (0) Margins: smooth (0) Echogenic foci: none (0) ACR TI-RADS total points: 2. ACR TI-RADS risk category: TR2 (2 points). ACR TI-RADS recommendations: Nodule does not meet criteria for surveillance or biopsy _________________________________________________________ Nodule # 4: Location: Left; Mid Maximum size: 2.1 cm; Other 2 dimensions: 2.0 cm x 1.7 cm Composition: solid/almost completely solid (2) Echogenicity: isoechoic (1) Shape: not taller-than-wide (0) Margins: ill-defined (0) Echogenic foci: none (0) ACR TI-RADS total points: 3. ACR TI-RADS risk category: TR3 (3 points). ACR TI-RADS recommendations: Nodule meets criteria for surveillance _________________________________________________________ Nodule # 5: Location: Left; Inferior Maximum size: 1.2 cm; Other 2 dimensions: 1.2 cm x 1.1 cm Composition: mixed cystic and solid (1) Echogenicity: isoechoic (1) Shape: taller-than-wide (3) Margins: ill-defined (0) Echogenic foci: none (0) ACR TI-RADS total  points: 5. ACR TI-RADS risk category: TR4 (4-6 points). ACR TI-RADS recommendations: Nodule meets criteria for surveillance _________________________________________________________ No adenopathy IMPRESSION: Left mid and left inferior thyroid nodules (labeled 4 and 5) meets criteria for surveillance, as designated by the newly established ACR TI-RADS criteria. Surveillance ultrasound study recommended to be performed annually up to 5 years. None of the other nodules meet criteria for surveillance or biopsy. Recommendations follow those established by the new ACR TI-RADS criteria (J Am Coll Radiol 0981;19:147-829). Electronically Signed   By: Corrie Mckusick D.O.   On: 08/04/2017 14:46    EKG: Atrial fib at 95 Telemetry Personally reviewed  Atrial fib with HR 110-100   Assessment and Plan:  Atrial fib with CVR  COPD O2 dependent  DVT-IVC filter in place  [facility] 2014 described by neurosurgery as small and posttraumatic  Seizure disorder--quiescient  PVC    Review of the old chart was helpful in clarifying the details of the events and interpretations from 2014  They are  As above but to summarize, Neurosurgery felt no long term ( >2 week post event) absolute or even strong contraindication to anticoagulation, their main concern being falling.  Hence, would concur with Dr MA that anticoagulation is appropriate  With her low BP, rate control will be difficult and would favor preceding with anticoagulation and TEE  If Afib were to recur, would use consider an antiarrhythmic drug, depending on the intervals  Have reviewed this with the pt, who as been strongly inclined towards NO bloodthinners of any type.  I read for her Dr Trenton Gammon note from 2014.   Thanks for consult   Virl Axe

## 2017-08-19 DIAGNOSIS — I4891 Unspecified atrial fibrillation: Secondary | ICD-10-CM

## 2017-08-19 DIAGNOSIS — I420 Dilated cardiomyopathy: Secondary | ICD-10-CM

## 2017-08-19 DIAGNOSIS — I481 Persistent atrial fibrillation: Principal | ICD-10-CM

## 2017-08-19 LAB — GLUCOSE, CAPILLARY
Glucose-Capillary: 120 mg/dL — ABNORMAL HIGH (ref 65–99)
Glucose-Capillary: 139 mg/dL — ABNORMAL HIGH (ref 65–99)
Glucose-Capillary: 99 mg/dL (ref 65–99)
Glucose-Capillary: 140 mg/dL — ABNORMAL HIGH (ref 65–99)

## 2017-08-19 LAB — BASIC METABOLIC PANEL
Anion gap: 8 (ref 5–15)
BUN: 45 mg/dL — ABNORMAL HIGH (ref 6–20)
CO2: 27 mmol/L (ref 22–32)
Calcium: 8.4 mg/dL — ABNORMAL LOW (ref 8.9–10.3)
Chloride: 100 mmol/L — ABNORMAL LOW (ref 101–111)
Creatinine, Ser: 0.91 mg/dL (ref 0.44–1.00)
GFR calc Af Amer: >60 mL/min (ref >60)
GFR calc non Af Amer: >60 mL/min (ref >60)
Glucose, Bld: 118 mg/dL — ABNORMAL HIGH (ref 65–99)
Potassium: 4.2 mmol/L (ref 3.5–5.1)
Sodium: 135 mmol/L (ref 135–145)

## 2017-08-19 MED ORDER — SENNOSIDES-DOCUSATE SODIUM 8.6-50 MG PO TABS
1.0000 | ORAL_TABLET | Freq: Two times a day (BID) | ORAL | Status: DC
  Administered 2017-08-20 – 2017-08-28 (×14): 1 via ORAL
  Filled 2017-08-19 (×16): qty 1

## 2017-08-19 NOTE — Progress Notes (Addendum)
Progress Note  Patient Name: Debra Saunders Date of Encounter: 08/19/2017  Primary Cardiologist: [name], MD  Subjective   Breathing overall improving. Not yet at baseline.  No c/p or palpitations.  Inpatient Medications    Scheduled Meds: . apixaban  5 mg Oral BID  . Chlorhexidine Gluconate Cloth  6 each Topical Q0600  . furosemide  20 mg Intravenous BID  . gabapentin  200 mg Oral TID  . insulin aspart  0-20 Units Subcutaneous TID WC  . insulin aspart  0-5 Units Subcutaneous QHS  . metoprolol tartrate  50 mg Oral Q6H  . mupirocin ointment  1 application Nasal BID  . pantoprazole  40 mg Oral Daily  . potassium chloride SA  20 mEq Oral BID  . rosuvastatin  10 mg Oral Daily  . senna-docusate  1 tablet Oral BID  . sildenafil  20 mg Oral TID   Continuous Infusions: . sodium chloride     PRN Meds: sodium chloride, acetaminophen, albuterol, ipratropium-albuterol, morphine injection, nitroGLYCERIN, ondansetron (ZOFRAN) IV, oxyCODONE-acetaminophen, polyethylene glycol, promethazine   Vital Signs    Vitals:   08/19/17 0900 08/19/17 1000 08/19/17 1100 08/19/17 1200  BP: 108/71 99/71 103/74   Pulse: 97 (!) 102 88 96  Resp: 17 (!) 25 19 18   Temp:      TempSrc:      SpO2: 96% 98% 99% 97%  Weight:      Height:        Intake/Output Summary (Last 24 hours) at 08/19/2017 1422 Last data filed at 08/19/2017 0900 Gross per 24 hour  Intake 170 ml  Output 1550 ml  Net -1380 ml   Filed Weights   08/17/17 0600 08/18/17 0458 08/19/17 0500  Weight: 214 lb 8.1 oz (97.3 kg) 216 lb 4.3 oz (98.1 kg) 222 lb 0.1 oz (100.7 kg)    Physical Exam   GEN: obese, in no acute distress.  HEENT: Grossly normal.  Neck: Supple, no JVD, carotid bruits, or masses. Cardiac: IR, IR, no murmurs, rubs, or gallops. No clubbing, cyanosis.  Trace nonpitting edema of left lower leg.  Radials/DP/PT 2+ and equal bilaterally.  Respiratory:  Respirations regular and unlabored, bibasilar  crackles. GI: Soft, nontender, nondistended, BS + x 4. MS: no deformity or atrophy. Skin: warm and dry, no rash. Neuro:  Strength and sensation are intact. Psych: AAOx3.  Normal affect.  Labs    Chemistry Recent Labs  Lab 08/16/17 1200  08/18/17 0418 08/18/17 1703 08/19/17 7793  NA  --    < > 135 132* 135  K  --    < > 4.0 4.1 4.2  CL  --    < > 100* 98* 100*  CO2  --    < > 26 24 27   GLUCOSE  --    < > 173* 133* 118*  BUN  --    < > 51* 52* 45*  CREATININE  --    < > 1.15* 1.06* 0.91  CALCIUM  --    < > 8.4* 8.7* 8.4*  PROT 8.0  --   --   --   --   ALBUMIN 3.8  --   --   --   --   AST 25  --   --   --   --   ALT 21  --   --   --   --   ALKPHOS 80  --   --   --   --   BILITOT  0.7  --   --   --   --   GFRNONAA  --    < > 46* 50* >60  GFRAA  --    < > 53* 58* >60  ANIONGAP  --    < > 9 10 8    < > = values in this interval not displayed.     Hematology Recent Labs  Lab 08/16/17 0925 08/17/17 0435  WBC 8.6 5.4  RBC 4.50 4.04  HGB 11.8* 10.6*  HCT 37.3 33.3*  MCV 82.9 82.6  MCH 26.2 26.4  MCHC 31.6* 31.9*  RDW 14.5 14.8*  PLT 230 219    Cardiac Enzymes Recent Labs  Lab 08/16/17 0925 08/16/17 1649 08/16/17 2147 08/17/17 0435  TROPONINI <0.03 <0.03 <0.03 <0.03      BNP Recent Labs  Lab 08/16/17 0925  BNP 655.0*      Radiology    No results found.  Telemetry    Afib, mostly 80's - Personally Reviewed  Cardiac Studies   2D Echocardiogram 2.25.2019  Study Conclusions  - Left ventricle: The cavity size was mildly dilated. Systolic   function was moderately to severely reduced. The estimated   ejection fraction was in the range of 30% to 35%. Diffuse   hypokinesis. - Aortic valve: There was trivial regurgitation. Valve area (VTI):   2.25 cm^2. Valve area (Vmax): 1.67 cm^2. Valve area (Vmean): 1.92   cm^2. - Mitral valve: There was severe regurgitation. - Left atrium: The atrium was moderately dilated. - Right atrium: The atrium was  mildly dilated. - Tricuspid valve: There was moderate regurgitation.  Patient Profile     72 y.o. female with a hx of nonobstructive CAD by prior LHC, chronic diastolic CHF, symptomatic bradycardia s/p PPM, frequent PVCs, chronic respiratory failure with hypoxia on home oxygen secondary to COPD, HTN, HLD, DM2, prior SAH in the setting of [place] for prior DVT now s/p filter placement, chronic right-sided atypical chest pain, lymphedema, stroke, OSA, and obesity, who was admitted 2/24 with dyspnea and AFib RVR.  EF now 30-35%.  Assessment & Plan    1.  Persistent Afib:  New dx.  Rate controlled but EF down on echo with volume overload on admission.  Volume improving with diuresis.  Seen by EP yesterday with recommendation to initiate eliquis after review of [facility] notes re: SAH a few yrs ago.  Plan for TEE and DCCV in the AM.  Cont  blocker.  2.  COPD/ILD: No active wheezing.  Per CCM.  3.  DVT s/p IVC Filter: chronic, mild left lower leg swelling.    4.  HTN:  Stable/soft.  5.  Acute systolic CHF:  EF 24-40% by echo 2/25.  Was 60-65% in 01/2017.  New LV depression, may be 2/2 Afib/loss of atrial kick.  Minus 410 ml overnight and 1.5L for admission.  Wt has actually been trending up. Renal fxn stable.  Exam challenging 2/2 body habitus.  Cont IV lasix for now.  Perhaps we can transition tomorrow once she is in sinus rhythm.  Signed, Murray Hodgkins, NP  08/19/2017, 2:22 PM    For questions or updates, please contact   Please consult [email or web address] for contact info under Cardiology/STEMI.   Attending Note Patient seen and examined, agree with detailed note above,  Patient presentation and plan discussed on rounds.   Bowel movement this afternoon, very short of breath getting back into bed.  Not at her baseline Ongoing symptoms for the past month  Telemetry reviewed showing  atrial fibrillation rate 90-100 bpm Echocardiogram reviewed ejection fraction 30-35%, diffuse hypokinesis  On  physical exam she is comfortable, unable to estimate JVP, lungs clear Rales at the bases, heart sounds irregularly irregular no murmurs appreciated, abdomen obese soft nontender no significant lower extremity edema  A/P: 1) persistent atrial fibrillation Contributing to CHF symptoms Continued shortness of breath with minimal exertion Recommendation by EP for TEE and cardioversion tomorrow morning.  Risk and benefit discussed with her in detail We will continue beta-blocker, n.p.o. in the morning  2) acute systolic CHF Ejection fraction 30-35%, likely secondary to arrhythmia, atrial fibrillation Would continue IV Lasix   Greater than 50% was spent in counseling and coordination of care with patient Total encounter time 35 minutes or more   Signed: Esmond Plants  M.D., Ph.D. [facility]

## 2017-08-19 NOTE — Consult Note (Signed)
  [facility] Consultation   Name: NICKOL COLLISTER MRN: [id number] DOB: 1945-08-27    ADMISSION DATE:  08/16/2017 CONSULTATION DATE: 08/16/2017  REFERRING MD :  Dr. Syble Creek  CHIEF COMPLAINT: Shortness of breath  SUBJECTIVE Patient resp status slowly improving Plan for cardioversion tomorrow by cardiology   VITAL SIGNS: Temp:  [98.4 F (36.9 C)-98.6 F (37 C)] 98.5 F (36.9 C) (02/26 1930) Pulse Rate:  [83-126] 100 (02/27 0600) Resp:  [15-29] 15 (02/27 0600) BP: (84-126)/(56-108) 126/108 (02/27 0600) SpO2:  [92 %-98 %] 96 % (02/27 0600) Weight:  [222 lb 0.1 oz (100.7 kg)] 222 lb 0.1 oz (100.7 kg) (02/27 0500)    Review of Systems: Gen:  Denies  fever, sweats, chills weigh loss  Other:  All other systems negative   PHYSICAL EXAMINATION:  GENERAL:NAD HEAD: Normocephalic, atraumatic.  EYES: Pupils equal, round, reactive to light.  No scleral icterus.  MOUTH: Moist mucosal membrane. NECK: Supple. No thyromegaly. No nodules. No JVD.  PULMONARY: CAT b/l CARDIOVASCULAR: S1 and S2. Regular rate and rhythm. No murmurs, rubs, or gallops.  GASTROINTESTINAL: Soft, nontender, -distended. No masses. Positive bowel sounds. No hepatosplenomegaly.  MUSCULOSKELETAL: No swelling, clubbing, or edema.  NEUROLOGIC: alert and awake, no focal weakness SKIN:intact,warm,dry   ASSESSMENT/PLAN   Respiratory failure-resolved Oxygen as needed   Rapid atrial fibrillation-plan for cardioversion tomorrow  Renal insufficiency.  Elevated BUN/creatinine ratio may reflect decreased effective renal plasma flow.  Remains step down status   Corrin Parker, M.D.  [facility]  Medical Director [place] Director [facility] Cardio-Pulmonary Department

## 2017-08-19 NOTE — Progress Notes (Signed)
Pharmacy Electrolyte Monitoring Consult:  Pharmacy consulted to assist in monitoring and replacing electrolytes in this 72 y.o. female admitted on 08/16/2017 with Shortness of Breath  Patient currently ordered furosemide 20mg  IV Q12hr and potassium 81mEq PO BID.   Labs:  Sodium (mmol/L)  Date Value  08/19/2017 135  12/28/2013 135 (L)   Potassium (mmol/L)  Date Value  08/19/2017 4.2  12/28/2013 3.2 (L)   Magnesium (mg/dL)  Date Value  08/18/2017 2.7 (H)  11/18/2013 1.7 (L)   Phosphorus (mg/dL)  Date Value  02/08/2013 2.1 (L)   Calcium (mg/dL)  Date Value  08/19/2017 8.4 (L)   Calcium, Total (mg/dL)  Date Value  12/28/2013 9.0   Albumin (g/dL)  Date Value  08/16/2017 3.8  08/16/2013 3.5    Plan: Will continue potassium 29mEq PO BID. Will recheck electrolytes with am labs on 3/1.   Pharmacy will continue to monitor and adjust per consult.   Adryen Cookson L 08/19/2017 9:19 AM

## 2017-08-19 NOTE — Progress Notes (Signed)
[facility] NAME: Debra Saunders    MR#:  [id number]  DATE OF BIRTH:  01-07-1946  SUBJECTIVE:  CHIEF COMPLAINT: Patient shortness of breath improved and chest pain resolved.  [name] Heart rate and blood pressure better  REVIEW OF SYSTEMS:  CONSTITUTIONAL: No fever, fatigue or weakness.  EYES: No blurred or double vision.  EARS, NOSE, AND THROAT: No tinnitus or ear pain.  RESPIRATORY: No cough, shortness of breath, wheezing or hemoptysis.  CARDIOVASCULAR: No chest pain, orthopnea, edema.  GASTROINTESTINAL: No nausea, vomiting, diarrhea or abdominal pain.  GENITOURINARY: No dysuria, hematuria.  ENDOCRINE: No polyuria, nocturia,  HEMATOLOGY: No anemia, easy bruising or bleeding SKIN: No rash or lesion. MUSCULOSKELETAL: No joint pain or arthritis.   NEUROLOGIC: No tingling, numbness, weakness.  PSYCHIATRY: No anxiety or depression.   DRUG ALLERGIES:   Allergies  Allergen Reactions  . Aspirin Hives and Other (See Comments)    Pt states that she is unable to take because she had bleeding in her brain.    . Other Other (See Comments)    Pt states that she is unable to take blood thinners because she had bleeding in her brain.   [name] Penicillins Hives, Shortness Of Breath, Swelling and Other (See Comments)    Has patient had a PCN reaction causing immediate rash, facial/tongue/throat swelling, SOB or lightheadedness with hypotension: Yes Has patient had a PCN reaction causing severe rash involving mucus membranes or skin necrosis: No Has patient had a PCN reaction that required hospitalization No Has patient had a PCN reaction occurring within the last 10 years: No If all of the above answers are "NO", then may proceed with Cephalosporin use.    VITALS:  Blood pressure 121/87, pulse 88, temperature 98 F (36.7 C), temperature source Oral, resp. rate (!) 27, height 5\' 1"  (1.549 m), weight 100.7 kg (222 lb 0.1 oz), SpO2 97  %.  PHYSICAL EXAMINATION:  GENERAL:  72 y.o.-year-old patient lying in the bed with no acute distress.  EYES: Pupils equal, round, reactive to light and accommodation. No scleral icterus. Extraocular muscles intact.  HEENT: Head atraumatic, normocephalic. Oropharynx and nasopharynx clear.  NECK:  Supple, no jugular venous distention. No thyroid enlargement, no tenderness.  LUNGS: Normal breath sounds bilaterally, no wheezing, rales,rhonchi or crepitation. No use of accessory muscles of respiration.  CARDIOVASCULAR: Irregularly irregular l. No murmurs, rubs, or gallops.  ABDOMEN: Soft, nontender, nondistended. Bowel sounds present. EXTREMITIES: No pedal edema, cyanosis, or clubbing.  NEUROLOGIC: Cranial nerves II through XII are intact.  Sensation intact. Gait not checked.  PSYCHIATRIC: The patient is alert and oriented x 3.  SKIN: No obvious rash, lesion, or ulcer.    LABORATORY PANEL:   CBC Recent Labs  Lab 08/17/17 0435  WBC 5.4  HGB 10.6*  HCT 33.3*  PLT 219   ------------------------------------------------------------------------------------------------------------------  Chemistries  Recent Labs  Lab 08/16/17 1200  08/18/17 0418  08/19/17 0632  NA  --    < > 135   < > 135  K  --    < > 4.0   < > 4.2  CL  --    < > 100*   < > 100*  CO2  --    < > 26   < > 27  GLUCOSE  --    < > 173*   < > 118*  BUN  --    < > 51*   < > 45*  CREATININE  --    < >  1.15*   < > 0.91  CALCIUM  --    < > 8.4*   < > 8.4*  MG  --    < > 2.7*  --   --   AST 25  --   --   --   --   ALT 21  --   --   --   --   ALKPHOS 80  --   --   --   --   BILITOT 0.7  --   --   --   --    < > = values in this interval not displayed.   ------------------------------------------------------------------------------------------------------------------  Cardiac Enzymes Recent Labs  Lab 08/17/17 0435  TROPONINI <0.03    ------------------------------------------------------------------------------------------------------------------  RADIOLOGY:  No results found.  EKG:   Orders placed or performed during the hospital encounter of 08/16/17  . ED EKG within 10 minutes  . ED EKG within 10 minutes    ASSESSMENT AND PLAN:    1 acute on chronic hypoxic hypercapnic respiratory failure Most likely secondary to acute A. fib with RVR, probable pacemaker malfunction Off BiPAP Amiodarone drip  weaned off Cardiology EP is planning to do TEE, cardioversion tomorrow for A. fib A. fib with RVR We might consider digoxin as needed basis for rapid ventricular rate  CT chest negative for PE Negative troponins and acute MI ruled out clinical exam inconsistent with heart failure-suspect elevated BNP related to A. fib with RVR/ supplemental oxygen as needed with weaning as tolerated-on 3-4 L continuous at home Pulmonology is following  2 acute on chronic A. fib with RVR Status post pacer None known anticoagulation given history of intracranial hemorrhage Pacer interrogation as stated above Patient did not respond well to Cardizem infusion which was discontinued and eventually patient responded well to amiodarone GTT which is slowly weaned off. Metoprolol  will be continued Echocardiogram 30-35% ejection fraction likely secondary to atrial fibrillation Normal TSH, and continue close medical monitoring  3 acute atypical chest pain-pain acute MI ruled out with negative cardiac enzymes Ongoing for approximately 5 days Suspected secondary to A. 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 post IVC filter placement Not on anticoagulation given history of  intracranial hemorrhage  5 chronic morbid obesity Most likely secondary to excess calories Lifestyle modification recommended  6 chronic diabetes mellitus type 2 Hemoglobin A1c 5.6 Sliding scale insulin with Accu-Cheks per routine, hold metformin  7 chronic diastolic congestive heart failure without exacerbation Stable Continue current regiment  8 COPD without exacerbation Stable Breathing treatments as needed  9 history of thyroid disease Normal TSH level      All the records are reviewed and case discussed with Care Management/Social Workerr. Management plans discussed with the patient, family and they are in agreement.  CODE STATUS: fc   TOTAL TIME TAKING CARE OF THIS PATIENT:36   minutes.   POSSIBLE D/C IN 1-2  DAYS, DEPENDING ON CLINICAL CONDITION.  Note: This dictation was prepared with Dragon dictation along with smaller phrase technology. Any transcriptional errors that result from this process are unintentional.   Nicholes Mango M.D on 08/19/2017 at 6:06 PM  Between 7am to 6pm - Pager - [phone] After 6pm go to [email or web address] - [facility]  Office  [phone]  CC: Primary care physician; [name], MD

## 2017-08-20 ENCOUNTER — Encounter: Payer: Self-pay | Admitting: Anesthesiology

## 2017-08-20 ENCOUNTER — Encounter
Admission: EM | Disposition: A | Discharge status: Skilled Nursing Facility | Payer: Self-pay | Source: Home / Self Care | Attending: Internal Medicine

## 2017-08-20 ENCOUNTER — Inpatient Hospital Stay
Admit: 2017-08-20 | Discharge: 2017-08-20 | Disposition: A | Discharge status: Home / Self Care | Payer: Medicare HMO | Attending: Nurse Practitioner | Admitting: Nurse Practitioner

## 2017-08-20 ENCOUNTER — Inpatient Hospital Stay: Payer: Medicare HMO | Admitting: Anesthesiology

## 2017-08-20 DIAGNOSIS — I4891 Unspecified atrial fibrillation: Secondary | ICD-10-CM

## 2017-08-20 DIAGNOSIS — I509 Heart failure, unspecified: Secondary | ICD-10-CM

## 2017-08-20 HISTORY — PX: TEE WITHOUT CARDIOVERSION: SHX5443

## 2017-08-20 LAB — GLUCOSE, CAPILLARY
Glucose-Capillary: 127 mg/dL — ABNORMAL HIGH (ref 65–99)
Glucose-Capillary: 132 mg/dL — ABNORMAL HIGH (ref 65–99)
Glucose-Capillary: 155 mg/dL — ABNORMAL HIGH (ref 65–99)

## 2017-08-20 SURGERY — ECHOCARDIOGRAM, TRANSESOPHAGEAL
Anesthesia: General

## 2017-08-20 MED ORDER — BUTAMBEN-TETRACAINE-BENZOCAINE 2-2-14 % EX AERO
INHALATION_SPRAY | CUTANEOUS | Status: AC
  Filled 2017-08-20: qty 5

## 2017-08-20 MED ORDER — VECURONIUM BROMIDE 10 MG IV SOLR: 10.0000 mg | Freq: Once | INTRAVENOUS | Status: DC

## 2017-08-20 MED ORDER — PROPOFOL 10 MG/ML IV BOLUS
INTRAVENOUS | Status: AC
Start: 2017-08-20 — End: 2017-08-20
  Filled 2017-08-20: qty 20

## 2017-08-20 MED ORDER — NALOXONE HCL 0.4 MG/ML IJ SOLN
INTRAMUSCULAR | Status: DC | PRN
  Administered 2017-08-20 (×2): 0.1 mg via INTRAVENOUS

## 2017-08-20 MED ORDER — FENTANYL CITRATE (PF) 100 MCG/2ML IJ SOLN
INTRAMUSCULAR | Status: DC | PRN
  Administered 2017-08-20: 50 ug via INTRAVENOUS

## 2017-08-20 MED ORDER — FLUMAZENIL 0.5 MG/5ML IV SOLN
INTRAVENOUS | Status: AC
  Filled 2017-08-20: qty 5

## 2017-08-20 MED ORDER — LIDOCAINE HCL (PF) 2 % IJ SOLN
INTRAMUSCULAR | Status: AC
  Filled 2017-08-20: qty 10

## 2017-08-20 MED ORDER — FENTANYL CITRATE (PF) 100 MCG/2ML IJ SOLN: 200.0000 ug | Freq: Once | INTRAMUSCULAR | Status: DC

## 2017-08-20 MED ORDER — MIDAZOLAM HCL 2 MG/2ML IJ SOLN
INTRAMUSCULAR | Status: AC
  Filled 2017-08-20: qty 2

## 2017-08-20 MED ORDER — SODIUM CHLORIDE FLUSH 0.9 % IV SOLN
INTRAVENOUS | Status: AC
  Filled 2017-08-20: qty 10

## 2017-08-20 MED ORDER — PHENYLEPHRINE HCL 10 MG/ML IJ SOLN
INTRAMUSCULAR | Status: AC
  Filled 2017-08-20: qty 1

## 2017-08-20 MED ORDER — LIDOCAINE VISCOUS 2 % MT SOLN
OROMUCOSAL | Status: AC
  Filled 2017-08-20: qty 15

## 2017-08-20 MED ORDER — LIDOCAINE HCL (CARDIAC) 20 MG/ML IV SOLN
INTRAVENOUS | Status: DC | PRN
  Administered 2017-08-20: 100 mg via INTRAVENOUS

## 2017-08-20 MED ORDER — PROPOFOL 10 MG/ML IV BOLUS
INTRAVENOUS | Status: DC | PRN
  Administered 2017-08-20 (×3): 10 mg via INTRAVENOUS

## 2017-08-20 MED ORDER — FENTANYL CITRATE (PF) 100 MCG/2ML IJ SOLN
INTRAMUSCULAR | Status: AC
  Filled 2017-08-20: qty 2

## 2017-08-20 MED ORDER — NALOXONE HCL 2 MG/2ML IJ SOSY
PREFILLED_SYRINGE | INTRAMUSCULAR | Status: AC
  Filled 2017-08-20: qty 2

## 2017-08-20 MED ORDER — MIDAZOLAM HCL 2 MG/2ML IJ SOLN: 4.0000 mg | Freq: Once | INTRAMUSCULAR | Status: DC

## 2017-08-20 MED ORDER — FLUMAZENIL 0.5 MG/5ML IV SOLN
INTRAVENOUS | Status: DC | PRN
  Administered 2017-08-20: 0.2 mg via INTRAVENOUS

## 2017-08-20 MED ORDER — MIDAZOLAM HCL 2 MG/2ML IJ SOLN
INTRAMUSCULAR | Status: DC | PRN
  Administered 2017-08-20: 1 mg via INTRAVENOUS

## 2017-08-20 MED ORDER — SODIUM CHLORIDE 0.9 % IV SOLN: INTRAVENOUS | Status: DC

## 2017-08-20 NOTE — Plan of Care (Signed)
  Progressing Education: Knowledge of General Education information will improve 08/20/2017 0546 - Progressing by Morene Crocker, RN Health Behavior/Discharge Planning: Ability to manage health-related needs will improve 08/20/2017 0546 - Progressing by Morene Crocker, RN Clinical Measurements: Ability to maintain clinical measurements within normal limits will improve 08/20/2017 0546 - Progressing by Morene Crocker, RN Will remain free from infection 08/20/2017 0546 - Progressing by Morene Crocker, RN Diagnostic test results will improve 08/20/2017 0546 - Progressing by Morene Crocker, RN Respiratory complications will improve 08/20/2017 0546 - Progressing by Morene Crocker, RN Cardiovascular complication will be avoided 08/20/2017 0546 - Progressing by Morene Crocker, RN Activity: Risk for activity intolerance will decrease 08/20/2017 0546 - Progressing by Morene Crocker, RN Nutrition: Adequate nutrition will be maintained 08/20/2017 0546 - Progressing by Morene Crocker, RN Coping: Level of anxiety will decrease 08/20/2017 0546 - Progressing by Morene Crocker, RN Elimination: Will not experience complications related to bowel motility 08/20/2017 0546 - Progressing by Morene Crocker, RN Will not experience complications related to urinary retention 08/20/2017 0546 - Progressing by Morene Crocker, RN Pain Managment: General experience of comfort will improve 08/20/2017 0546 - Progressing by Morene Crocker, RN Safety: Ability to remain free from injury will improve 08/20/2017 0546 - Progressing by Morene Crocker, RN Skin Integrity: Risk for impaired skin integrity will decrease 08/20/2017 0546 - Progressing by Morene Crocker, RN Education: Knowledge of disease or condition will improve 08/20/2017 0546 - Progressing by Morene Crocker, RN Understanding of medication regimen will improve 08/20/2017 0546 -  Progressing by Morene Crocker, RN Activity: Ability to tolerate increased activity will improve 08/20/2017 0546 - Progressing by Morene Crocker, [place] Behavior/Discharge Planning: Ability to safely manage health-related needs after discharge will improve 08/20/2017 0546 - Progressing by Morene Crocker, RN

## 2017-08-20 NOTE — Transfer of Care (Signed)
Immediate Anesthesia Transfer of Care Note  Patient: Debra Saunders  Procedure(s) Performed: TRANSESOPHAGEAL ECHOCARDIOGRAM (TEE) & Direct current cardioversion (N/A )  Patient Location: PACU  Anesthesia Type:MAC  Level of Consciousness: awake and responds to stimulation  Airway & Oxygen Therapy: Patient Spontanous Breathing and placed on BiPaP  Post-op Assessment: Report given to RN and Post -op Vital signs reviewed and stable  Post vital signs: Reviewed and stable  Last Vitals:  Vitals:   08/20/17 1034 08/20/17 1037  BP:  110/78  Pulse: 73 60  Resp: 11 19  Temp:    SpO2: 99% 98%    Last Pain:  Vitals:   08/20/17 0800  TempSrc: Oral  PainSc:       Patients Stated Pain Goal: 6 (26/94/85 4627)  Complications: No apparent anesthesia complications

## 2017-08-20 NOTE — Progress Notes (Signed)
Report given to Amy, RN to transfer pt to room 249 with personal belonging carried over with son and daughter in law.

## 2017-08-20 NOTE — Anesthesia Preprocedure Evaluation (Addendum)
Anesthesia Evaluation  Patient identified by MRN, date of birth, ID band Patient awake    Reviewed: Allergy & Precautions, NPO status , Patient's Chart, lab work & pertinent test results  Airway Mallampati: III  TM Distance: <3 FB     Dental   Pulmonary sleep apnea , COPD,  COPD inhaler and oxygen dependent, former smoker,    + rhonchi  + decreased breath sounds      Cardiovascular hypertension, +CHF  Normal cardiovascular exam+ dysrhythmias Atrial Fibrillation      Neuro/Psych CVA negative psych ROS   GI/Hepatic Neg liver ROS,   Endo/Other  diabetes, Well Controlled, Type 2, Oral Hypoglycemic AgentsHypothyroidism   Renal/GU  Bladder dysfunction      Musculoskeletal  (+) Arthritis , Osteoarthritis,    Abdominal (+) + obese,   Peds negative pediatric ROS (+)  Hematology  (+) anemia ,   Anesthesia Other Findings Past Medical History: No date: Arthritis No date: Atrial fibrillation, persistent (HCC) No date: Chest pain     Comment:  a. 01/2012 Cath: nonobs dzs;  c. 04/2014 MV: No ischemia. No date: Chronic back pain No date: Chronic diastolic CHF (congestive heart failure) ([place])     Comment:  a. Dx 2005 @ [facility];  b. 02/2014 Echo: Ef 55-60%, no rwma,               mild MR, PASP 68mmHg. No date: Chronic respiratory failure ([place])     Comment:  a. on home O2 No date: COPD (chronic obstructive pulmonary disease) ([place])     Comment:  a. Home O2 - 3lpm No date: Gastritis No date: History of intracranial hemorrhage     Comment:  6/14 described by neurosurgery as "small frontal               posttraumatic SAH " No date: Hyperlipidemia No date: Hypertension No date: Lipoma of back No date: Lymphedema     Comment:  a. Chronic LLE edema. No date: Obesity No date: Sepsis with metabolic encephalopathy (HCC) No date: Sleep apnea No date: Streptococcal bacteremia No date: Stroke [facility]) No date: Symptomatic bradycardia  Comment:  a. s/p BSX PPM. No date: Thyroid disorder No date: Thyroid nodule No date: Type II diabetes mellitus (HCC) No date: Urinary incontinence  Reproductive/Obstetrics                            Anesthesia Physical Anesthesia Plan  ASA: IV  Anesthesia Plan: General   Post-op Pain Management:    Induction: Intravenous  PONV Risk Score and Plan:   Airway Management Planned: Simple Face Mask and Nasal Cannula  Additional Equipment:   Intra-op Plan:   Post-operative Plan:   Informed Consent: I have reviewed the patients History and Physical, chart, labs and discussed the procedure including the risks, benefits and alternatives for the proposed anesthesia with the patient or authorized representative who has indicated his/her understanding and acceptance.   Dental advisory given  Plan Discussed with: CRNA and Surgeon  Anesthesia Plan Comments:         Anesthesia Quick Evaluation

## 2017-08-20 NOTE — Progress Notes (Signed)
   08/20/17 1310  Clinical Encounter Type  Visited With Patient and family together  Visit Type Follow-up  Spiritual Encounters  Spiritual Needs Emotional;Prayer   Chaplain met with patient and family.  Patient spoke of recent procedure and current treatment plan.  Patient looking forward to when she is able to go home.  Chaplain had prayer with patient and family.

## 2017-08-20 NOTE — Progress Notes (Signed)
Patient post procedures ( TEE/CV). Vitals stable, patient required reversals during procedure, afterwhich stablized. Placed on BIPAP after procedure done per Dr Rockey Situ. Cardioverted to Sinus rhythm with intermittent paced rhythm. Report called to ICU care nurse with plan reviewed.

## 2017-08-20 NOTE — Anesthesia Postprocedure Evaluation (Signed)
Anesthesia Post Note  Patient: Debra Saunders  Procedure(s) Performed: TRANSESOPHAGEAL ECHOCARDIOGRAM (TEE) & Direct current cardioversion (N/A )  Patient location during evaluation: PACU Anesthesia Type: General Level of consciousness: oriented and awake Pain management: pain level controlled Vital Signs Assessment: post-procedure vital signs reviewed and stable Respiratory status: patient connected to nasal cannula oxygen Cardiovascular status: blood pressure returned to baseline Comments: Patient required some reversal after respirations decreased, and also placed back on Bipap until she woke up fully.  Patient had previously been on intermittent Bipap.     Last Vitals:  Vitals:   08/20/17 1455 08/20/17 1500  BP: 115/60 (!) 97/58  Pulse: 60 60  Resp:  19  Temp:    SpO2:  98%    Last Pain:  Vitals:   08/20/17 1230  TempSrc: Axillary  PainSc:                  Takyra Cantrall

## 2017-08-20 NOTE — CV Procedure (Signed)
Cardioversion procedure note For atrial fibrillation, persistent  Procedure Details:  Consent: Risks of procedure as well as the alternatives and risks of each were explained to the (patient/caregiver). Consent for procedure obtained.  Time Out: Verified patient identification, verified procedure, site/side was marked, verified correct patient position, special equipment/implants available, medications/allergies/relevent history reviewed, required imaging and test results available. Performed  Patient placed on cardiac monitor, pulse oximetry, supplemental oxygen as necessary.  Sedation given: propofol IV, Dr. Kayleen Memos Pacer pads placed anterior and posterior chest.   Cardioverted 1 time(s).  Cardioverted at  150 J. Synchronized biphasic Converted to NSR   Evaluation: Findings: Post procedure EKG shows: NSR Complications: None Patient did tolerate procedure well. She will require Bipap in recovery  Time Spent Directly with the Patient:  45 minutes   Esmond Plants, M.D., Ph.D.

## 2017-08-20 NOTE — Procedures (Signed)
Transesophageal Echocardiogram :  Indication: atrial fib, cardioversion, rule out left atrial appendage thrombus Requesting/ordering  physician: Esmond Plants  Procedure: Benzocaine spray x2 and 2 mls x 2 of viscous lidocaine were given orally to provide local anesthesia to the oropharynx. The patient was positioned supine on the left side, bite block provided. The patient was moderately sedated with the doses of versed and fentanyl as detailed below.  Using digital technique an omniplane probe was advanced into the distal esophagus without incident.   Moderate sedation: Per anesthesia Profound response to minimal sedation, small amounts of versed and fentanyl Please refer to anesthesia note  See report in EPIC  for complete details: In brief, transgastric imaging revealed normal LV function with no RWMAs and no mural apical thrombus.  .no left atrial appendage thrombus.   Estimated ejection fraction was 35%.  Right sided cardiac chambers were normal with no evidence of pulmonary hypertension.  Imaging of the septum showed no ASD or VSD Bubble study was negative for shunt 2D and color flow confirmed no PFO  The LA was well visualized in orthogonal views.  There was no spontaneous contrast and no thrombus in the LA and LA appendage   The descending thoracic aorta and arch had moderate atherosclerosis with no evidence of aneurysmal dilation or disection   Ida Rogue 08/20/2017 10:22 AM

## 2017-08-20 NOTE — Progress Notes (Signed)
Pharmacy Electrolyte Monitoring Consult:  Pharmacy consulted to assist in monitoring and replacing electrolytes in this 72 y.o. female admitted on 08/16/2017 with Shortness of Breath  Patient currently ordered furosemide 20mg  IV Q12hr and potassium 46mEq PO BID.   Labs:  Sodium (mmol/Saunders)  Date Value  08/19/2017 135  12/28/2013 135 (Saunders)   Potassium (mmol/Saunders)  Date Value  08/19/2017 4.2  12/28/2013 3.2 (Saunders)   Magnesium (mg/dL)  Date Value  08/18/2017 2.7 (H)  11/18/2013 1.7 (Saunders)   Phosphorus (mg/dL)  Date Value  02/08/2013 2.1 (Saunders)   Calcium (mg/dL)  Date Value  08/19/2017 8.4 (Saunders)   Calcium, Total (mg/dL)  Date Value  12/28/2013 9.0   Albumin (g/dL)  Date Value  08/16/2017 3.8  08/16/2013 3.5    Plan: Will continue potassium 15mEq PO BID. Will recheck electrolytes with am labs on 3/1.   Pharmacy will continue to monitor and adjust per consult.   Debra Saunders 08/20/2017 2:59 PM

## 2017-08-20 NOTE — Progress Notes (Addendum)
Progress Note  Patient Name: Debra Saunders Date of Encounter: 08/20/2017  Primary Cardiologist: [name], MD  Subjective   TEE and cardioversion this morning Significant sedation response to low-dose Versed fentanyl, Anesthesia at the bedside for procedure having to support respiratory status Narcan and flumazenil given After procedure started on BiPAP for suspected hypercapnia, respiratory depression.  Communicative after the procedure Normal sinus rhythm obtained, paced  Inpatient Medications    Scheduled Meds: . apixaban  5 mg Oral BID  . butamben-tetracaine-benzocaine      . Chlorhexidine Gluconate Cloth  6 each Topical Q0600  . flumazenil      . furosemide  20 mg Intravenous BID  . gabapentin  200 mg Oral TID  . insulin aspart  0-20 Units Subcutaneous TID WC  . insulin aspart  0-5 Units Subcutaneous QHS  . lidocaine      . metoprolol tartrate  50 mg Oral Q6H  . mupirocin ointment  1 application Nasal BID  . naloxone      . pantoprazole  40 mg Oral Daily  . potassium chloride SA  20 mEq Oral BID  . rosuvastatin  10 mg Oral Daily  . senna-docusate  1 tablet Oral BID  . sildenafil  20 mg Oral TID  . sodium chloride flush       Continuous Infusions: . sodium chloride    . sodium chloride     PRN Meds: sodium chloride, acetaminophen, albuterol, ipratropium-albuterol, morphine injection, nitroGLYCERIN, ondansetron (ZOFRAN) IV, oxyCODONE-acetaminophen, polyethylene glycol, promethazine   Vital Signs    Vitals:   08/20/17 0700 08/20/17 0750 08/20/17 0800 08/20/17 0900  BP: 109/88 109/88 100/80 117/86  Pulse: 73 75 72 74  Resp: (!) 21  15 13   Temp:   97.7 F (36.5 C)   TempSrc:   Oral   SpO2: 96%  99% 99%  Weight:      Height:        Intake/Output Summary (Last 24 hours) at 08/20/2017 1034 Last data filed at 08/20/2017 0547 Gross per 24 hour  Intake 480 ml  Output 925 ml  Net -445 ml   Filed Weights   08/18/17 0458 08/19/17 0500 08/20/17 0200    Weight: 216 lb 4.3 oz (98.1 kg) 222 lb 0.1 oz (100.7 kg) 243 lb 6.2 oz (110.4 kg)    Physical Exam   GEN: obese, in no acute distress.  HEENT: Grossly normal.  Neck: Supple, no JVD, carotid bruits, or masses. Cardiac: RRR, no murmurs, rubs, or gallops. No clubbing, cyanosis.  Trace nonpitting edema of left lower leg.  Radials/DP/PT 2+ and equal bilaterally.  Respiratory:  Respirations regular and unlabored, bibasilar crackles. GI: Soft, nontender, nondistended, BS + x 4. MS: no deformity or atrophy. Skin: warm and dry, no rash. Neuro: Complete neuro exam not performed but appears to be intact Psych: [place]  Lab 08/16/17 1200  08/18/17 0418 08/18/17 1703 08/19/17 8144  NA  --    < > 135 132* 135  K  --    < > 4.0 4.1 4.2  CL  --    < > 100* 98* 100*  CO2  --    < > 26 24 27   GLUCOSE  --    < > 173* 133* 118*  BUN  --    < > 51* 52* 45*  CREATININE  --    < > 1.15* 1.06* 0.91  CALCIUM  --    < >  8.4* 8.7* 8.4*  PROT 8.0  --   --   --   --   ALBUMIN 3.8  --   --   --   --   AST 25  --   --   --   --   ALT 21  --   --   --   --   ALKPHOS 80  --   --   --   --   BILITOT 0.7  --   --   --   --   GFRNONAA  --    < > 46* 50* >60  GFRAA  --    < > 53* 58* >60  ANIONGAP  --    < > 9 10 8    < > = values in this interval not displayed.     Hematology Recent Labs  Lab 08/16/17 0925 08/17/17 0435  WBC 8.6 5.4  RBC 4.50 4.04  HGB 11.8* 10.6*  HCT 37.3 33.3*  MCV 82.9 82.6  MCH 26.2 26.4  MCHC 31.6* 31.9*  RDW 14.5 14.8*  PLT 230 219    Cardiac Enzymes Recent Labs  Lab 08/16/17 0925 08/16/17 1649 08/16/17 2147 08/17/17 0435  TROPONINI <0.03 <0.03 <0.03 <0.03      BNP Recent Labs  Lab 08/16/17 0925  BNP 655.0*      Radiology    No results found.  Telemetry    A. fib this morning, now normal sinus rhythm after procedure- Personally Reviewed  Cardiac Studies   2D Echocardiogram 2.25.2019  Study  Conclusions  - Left ventricle: The cavity size was mildly dilated. Systolic   function was moderately to severely reduced. The estimated   ejection fraction was in the range of 30% to 35%. Diffuse   hypokinesis. - Aortic valve: There was trivial regurgitation. Valve area (VTI):   2.25 cm^2. Valve area (Vmax): 1.67 cm^2. Valve area (Vmean): 1.92   cm^2. - Mitral valve: There was severe regurgitation. - Left atrium: The atrium was moderately dilated. - Right atrium: The atrium was mildly dilated. - Tricuspid valve: There was moderate regurgitation.  Patient Profile     72 y.o. 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  EF now 30-35%.  Assessment & Plan    1.  Persistent Afib:   Successful TEE and cardioversion this morning Would continue Eliquis, Lasix, metoprolol for rate control  2.  COPD/ILD:  No active wheezing.   Suspected obesity hypoventilation, sleep apnea Will need BiPAP when she is napping and sleeping, profound response during TEE and cardioversion requiring support  3.  DVT s/p IVC Filter:  chronic, mild left lower leg swelling.    4.  HTN:   Stable/soft.  5.  Acute systolic CHF:  EF 40-76% by echo 2/25.   Ejection fraction will likely improve after cardioversion restoring normal sinus rhythm Repeat echocardiogram at a later date, limited study   Total encounter time more than 25 minutes  Greater than 50% was spent in counseling and coordination of care with the patient   Signed, Ida Rogue, MD  08/20/2017, 10:34 AM    For questions or updates, please contact

## 2017-08-20 NOTE — Anesthesia Post-op Follow-up Note (Signed)
Anesthesia QCDR form completed.        

## 2017-08-20 NOTE — Progress Notes (Signed)
Called [facility] to clarify NPO status and pt can be given meds with sips of water.

## 2017-08-20 NOTE — Progress Notes (Signed)
Patient ID: Debra Saunders, female   DOB: 13-Dec-1945, 72 y.o.   MRN: [id number]  [place] PROGRESS NOTE  MYLINH CRAGG IRC:[id number] DOB: 1945/12/30 DOA: 08/16/2017 PCP: [name], MD  HPI/Subjective: Patient states that she has a sore throat after her cardioversion TEE.  Otherwise doing a lot better than when she came in.  Breathing better.  Objective: Vitals:   08/20/17 1455 08/20/17 1500  BP: 115/60 (!) 97/58  Pulse: 60 60  Resp:  19  Temp:    SpO2:  98%    Filed Weights   08/18/17 0458 08/19/17 0500 08/20/17 0200  Weight: 98.1 kg (216 lb 4.3 oz) 100.7 kg (222 lb 0.1 oz) 110.4 kg (243 lb 6.2 oz)    ROS: Review of Systems  Constitutional: Negative for chills and fever.  HENT: Positive for sore throat.   Eyes: Negative for blurred vision.  Respiratory: Negative for cough and shortness of breath.   Cardiovascular: Negative for chest pain.  Gastrointestinal: Negative for abdominal pain, constipation, diarrhea, nausea and vomiting.  Genitourinary: Negative for dysuria.  Musculoskeletal: Negative for joint pain.  Neurological: Negative for dizziness and headaches.   Exam: Physical Exam  Constitutional: She is oriented to person, place, and time.  HENT:  Nose: No mucosal edema.  Mouth/Throat: No oropharyngeal exudate or posterior oropharyngeal edema.  Eyes: Conjunctivae, EOM and lids are normal. Pupils are equal, round, and reactive to light.  Neck: No JVD present. Carotid bruit is not present. No edema present. No thyroid mass and no thyromegaly present.  Cardiovascular: Regular rhythm, S1 normal and S2 normal. Exam reveals no gallop.  No murmur heard. Pulses:      Dorsalis pedis pulses are 2+ on the right side, and 2+ on the left side.  Respiratory: No respiratory distress. She has decreased breath sounds in the right lower field and the left lower field. She has no wheezes. She has no rhonchi. She has no rales.  GI: Soft. Bowel sounds are normal.  There is no tenderness.  Musculoskeletal:       Right ankle: She exhibits swelling.       Left ankle: She exhibits swelling.  Lymphadenopathy:    She has no cervical adenopathy.  Neurological: She is alert and oriented to person, place, and time. No cranial nerve deficit.  Skin: Skin is warm. No rash noted. Nails show no clubbing.  Psychiatric: She has a normal mood and affect.      Data Reviewed: Basic Metabolic Panel: Recent Labs  Lab 08/16/17 0925 08/17/17 0435 08/18/17 0418 08/18/17 1703 08/19/17 0632  NA 139 133* 135 132* 135  K 3.7 4.0 4.0 4.1 4.2  CL 103 100* 100* 98* 100*  CO2 23 24 26 24 27   GLUCOSE 136* 195* 173* 133* 118*  BUN 45* 42* 51* 52* 45*  CREATININE 1.36* 1.25* 1.15* 1.06* 0.91  CALCIUM 9.3 8.4* 8.4* 8.7* 8.4*  MG  --  1.6* 2.7*  --   --    Liver Function Tests: Recent Labs  Lab 08/16/17 1200  AST 25  ALT 21  ALKPHOS 80  BILITOT 0.7  PROT 8.0  ALBUMIN 3.8   Recent Labs  Lab 08/16/17 1200 08/16/17 1441  LIPASE 28 27   No results for input(s): AMMONIA in the last 168 hours. CBC: Recent Labs  Lab 08/16/17 0925 08/17/17 0435  WBC 8.6 5.4  HGB 11.8* 10.6*  HCT 37.3 33.3*  MCV 82.9 82.6  PLT 230 219   Cardiac Enzymes: Recent  Labs  Lab 08/16/17 0925 08/16/17 1649 08/16/17 2147 08/17/17 0435  TROPONINI <0.03 <0.03 <0.03 <0.03   BNP (last 3 results) Recent Labs    08/04/17 1552 08/16/17 0925  BNP 175.0* 655.0*    ProBNP (last 3 results) No results for input(s): PROBNP in the last 8760 hours.  CBG: Recent Labs  Lab 08/19/17 1204 08/19/17 1606 08/19/17 2118 08/20/17 0741 08/20/17 1246  GLUCAP 139* 99 140* 127* 132*    Recent Results (from the past 240 hour(s))  MRSA PCR Screening     Status: Abnormal   Collection Time: 08/16/17  1:39 PM  Result Value Ref Range Status   MRSA by PCR POSITIVE (A) NEGATIVE Final    Comment:        [name] (FDA approved for NASAL specimens only), is one component  of a comprehensive MRSA colonization surveillance program. It is not intended to diagnose MRSA infection nor to guide or monitor treatment for MRSA infections. RESULT CALLED TO, READ BACK BY AND VERIFIED WITH: BRITTNEY RUDD @ [id number] ON 08/16/2017 BY CAF Performed at [facility], [address] [place], [place] [address]   Culture, blood (routine x 2)     Status: None (Preliminary result)   Collection Time: 08/16/17  4:49 PM  Result Value Ref Range Status   Specimen Description BLOOD LEFT ANTECUBITAL  Final   Special Requests   Final    BOTTLES DRAWN AEROBIC AND ANAEROBIC Blood Culture results may not be optimal due to an inadequate volume of blood received in culture bottles   Culture   Final    NO GROWTH 4 DAYS Performed at [facility], [address] [name], [place] [address]    Report Status PENDING  Incomplete  Culture, blood (routine x 2)     Status: None (Preliminary result)   Collection Time: 08/16/17  4:49 PM  Result Value Ref Range Status   Specimen Description BLOOD LEFT ANTECUBITAL  Final   Special Requests   Final    BOTTLES DRAWN AEROBIC AND ANAEROBIC Blood Culture results may not be optimal due to an inadequate volume of blood received in culture bottles   Culture   Final    NO GROWTH 4 DAYS Performed at [facility], [address] [place], [place] [address]    Report Status PENDING  Incomplete      Scheduled Meds: . apixaban  5 mg Oral BID  . butamben-tetracaine-benzocaine      . Chlorhexidine Gluconate Cloth  6 each Topical Q0600  . flumazenil      . furosemide  20 mg Intravenous BID  . gabapentin  200 mg Oral TID  . insulin aspart  0-20 Units Subcutaneous TID WC  . insulin aspart  0-5 Units Subcutaneous QHS  . lidocaine      . metoprolol tartrate  50 mg Oral Q6H  . mupirocin ointment  1 application Nasal BID  . naloxone      . potassium chloride SA  20 mEq Oral BID  . rosuvastatin  10 mg Oral Daily  . senna-docusate   1 tablet Oral BID  . sildenafil  20 mg Oral TID  . sodium chloride flush       Continuous Infusions: . sodium chloride      Assessment/Plan:  1. Acute on chronic hypoxic hypercapnic respiratory failure.  Patient required BiPAP on presentation and now back to usual 4 L nasal cannula 2. Atrial fibrillation with rapid ventricular response.  Patient is  status post cardioversion and now in normal sinus rhythm.  Patient on Eliquis for anticoagulation and metoprolol for rate control. 3. Chest pain likely secondary to fast rate. 4. Hyperlipidemia unspecified on Crestor 5. Chronic diastolic congestive heart failure.  No signs currently. 6. History of type 2 diabetes mellitus.  Likely can be off medications at this point since hemoglobin A1c is actually low. 7. Morbid obesity weight loss needed 8. Physical therapy evaluation  Code Status:     Code Status Orders  (From admission, onward)        Start     Ordered   08/16/17 1614  Full code  Continuous     08/16/17 1613    Code Status History    Date Active Date Inactive Code Status Order ID Comments User Context   03/30/2015 21:52 04/03/2015 21:22 Full Code [id number]  Etta Quill, DO Inpatient   03/27/2015 22:08 03/28/2015 17:37 Full Code [id number]  Nicholes Mango, MD ED   01/09/2014 14:18 01/10/2014 18:03 Full Code [id number]  Deboraha Sprang, MD Inpatient   01/16/2013 23:35 01/27/2013 20:11 Full Code [id number]  Collene Gobble, MD Inpatient     Family Communication: Family at bedside Disposition Plan: To be determined  Consultants:  Critical care specialist  Cardiology  Time spent: 28 minutes  [place]

## 2017-08-21 DIAGNOSIS — I5021 Acute systolic (congestive) heart failure: Secondary | ICD-10-CM

## 2017-08-21 LAB — CULTURE, BLOOD (ROUTINE X 2)
Culture: NO GROWTH
Culture: NO GROWTH

## 2017-08-21 LAB — GLUCOSE, CAPILLARY
Glucose-Capillary: 106 mg/dL — ABNORMAL HIGH (ref 65–99)
Glucose-Capillary: 146 mg/dL — ABNORMAL HIGH (ref 65–99)
Glucose-Capillary: 115 mg/dL — ABNORMAL HIGH (ref 65–99)
Glucose-Capillary: 175 mg/dL — ABNORMAL HIGH (ref 65–99)

## 2017-08-21 MED ORDER — METOPROLOL TARTRATE 50 MG PO TABS
100.0000 mg | ORAL_TABLET | Freq: Two times a day (BID) | ORAL | Status: DC
  Administered 2017-08-21 – 2017-08-22 (×2): 100 mg via ORAL
  Filled 2017-08-21 (×3): qty 2

## 2017-08-21 NOTE — Plan of Care (Signed)
  Education: Knowledge of General Education information will improve 08/21/2017 1340 - Progressing by Darrelyn Hillock, RN   Education: Knowledge of General Education information will improve 08/21/2017 1340 - Progressing by Darrelyn Hillock, RN   Education: Knowledge of General Education information will improve 08/21/2017 1340 - Progressing by Darrelyn Hillock, RN   Education: Knowledge of General Education information will improve 08/21/2017 1340 - Progressing by Darrelyn Hillock, RN   Education: Knowledge of General Education information will improve 08/21/2017 1340 - Progressing by Darrelyn Hillock, RN   Education: Knowledge of General Education information will improve 08/21/2017 1340 - Progressing by Darrelyn Hillock, RN   Education: Knowledge of General Education information will improve 08/21/2017 1340 - Progressing by Darrelyn Hillock, RN   Education: Knowledge of General Education information will improve 08/21/2017 1340 - Progressing by Darrelyn Hillock, RN

## 2017-08-21 NOTE — Progress Notes (Signed)
Patient ID: Debra Saunders, female   DOB: Nov 09, 1945, 72 y.o.   MRN: [id number]  [place] PROGRESS NOTE  Debra Saunders QJF:[id number] DOB: March 16, 1946 DOA: 08/16/2017 PCP: [name], MD  HPI/Subjective:  She  is off the BiPAP, seen on telemetry.  No chest pain.  But feels awfully weak. PObjective: Vitals:   08/21/17 0345 08/21/17 0937  BP: 122/71 114/67  Pulse: 60 60  Resp: 18 14  Temp: 98 F (36.7 C) 98.1 F (36.7 C)  SpO2: 100% 100%    Filed Weights   08/19/17 0500 08/20/17 0200 08/21/17 0345  Weight: 100.7 kg (222 lb 0.1 oz) 110.4 kg (243 lb 6.2 oz) 111 kg (244 lb 11.2 oz)    ROS: Review of Systems  Constitutional: Negative for chills and fever.  HENT: Negative for sore throat.   Eyes: Negative for blurred vision.  Respiratory: Negative for cough and shortness of breath.   Cardiovascular: Negative for chest pain.  Gastrointestinal: Negative for abdominal pain, constipation, diarrhea, nausea and vomiting.  Genitourinary: Negative for dysuria.  Musculoskeletal: Negative for joint pain.  Neurological: Negative for dizziness and headaches.   Exam: Physical Exam  Constitutional: She is oriented to person, place, and time.  HENT:  Nose: No mucosal edema.  Mouth/Throat: No oropharyngeal exudate or posterior oropharyngeal edema.  Eyes: Conjunctivae, EOM and lids are normal. Pupils are equal, round, and reactive to light.  Neck: No JVD present. Carotid bruit is not present. No edema present. No thyroid mass and no thyromegaly present.  Cardiovascular: Regular rhythm, S1 normal and S2 normal. Exam reveals no gallop.  No murmur heard. Pulses:      Dorsalis pedis pulses are 2+ on the right side, and 2+ on the left side.  Respiratory: No respiratory distress. She has no decreased breath sounds. She has no wheezes. She has no rhonchi. She has no rales.  GI: Soft. Bowel sounds are normal. There is no tenderness.  Musculoskeletal:       Right ankle: She exhibits  swelling.       Left ankle: She exhibits swelling.  Lymphadenopathy:    She has no cervical adenopathy.  Neurological: She is alert and oriented to person, place, and time. No cranial nerve deficit.  Skin: Skin is warm. No rash noted. Nails show no clubbing.  Psychiatric: She has a normal mood and affect.      Data Reviewed: Basic Metabolic Panel: Recent Labs  Lab 08/16/17 0925 08/17/17 0435 08/18/17 0418 08/18/17 1703 08/19/17 0632  NA 139 133* 135 132* 135  K 3.7 4.0 4.0 4.1 4.2  CL 103 100* 100* 98* 100*  CO2 23 24 26 24 27   GLUCOSE 136* 195* 173* 133* 118*  BUN 45* 42* 51* 52* 45*  CREATININE 1.36* 1.25* 1.15* 1.06* 0.91  CALCIUM 9.3 8.4* 8.4* 8.7* 8.4*  MG  --  1.6* 2.7*  --   --    Liver Function Tests: Recent Labs  Lab 08/16/17 1200  AST 25  ALT 21  ALKPHOS 80  BILITOT 0.7  PROT 8.0  ALBUMIN 3.8   Recent Labs  Lab 08/16/17 1200 08/16/17 1441  LIPASE 28 27   No results for input(s): AMMONIA in the last 168 hours. CBC: Recent Labs  Lab 08/16/17 0925 08/17/17 0435  WBC 8.6 5.4  HGB 11.8* 10.6*  HCT 37.3 33.3*  MCV 82.9 82.6  PLT 230 219   Cardiac Enzymes: Recent Labs  Lab 08/16/17 0925 08/16/17 1649 08/16/17 2147 08/17/17 0435  TROPONINI <0.03 <0.03 <0.03 <0.03   BNP (last 3 results) Recent Labs    08/04/17 1552 08/16/17 0925  BNP 175.0* 655.0*    ProBNP (last 3 results) No results for input(s): PROBNP in the last 8760 hours.  CBG: Recent Labs  Lab 08/20/17 0741 08/20/17 1246 08/20/17 2122 08/21/17 0800 08/21/17 1142  GLUCAP 127* 132* 155* 106* 146*    Recent Results (from the past 240 hour(s))  MRSA PCR Screening     Status: Abnormal   Collection Time: 08/16/17  1:39 PM  Result Value Ref Range Status   MRSA by PCR POSITIVE (A) NEGATIVE Final    Comment:        [name] (FDA approved for NASAL specimens only), is one component of a comprehensive MRSA colonization surveillance program. It is  not intended to diagnose MRSA infection nor to guide or monitor treatment for MRSA infections. RESULT CALLED TO, READ BACK BY AND VERIFIED WITH: BRITTNEY RUDD @ 9449 ON 08/16/2017 BY CAF Performed at [facility], [address] [place], [place] [address]   Culture, blood (routine x 2)     Status: None   Collection Time: 08/16/17  4:49 PM  Result Value Ref Range Status   Specimen Description BLOOD LEFT ANTECUBITAL  Final   Special Requests   Final    BOTTLES DRAWN AEROBIC AND ANAEROBIC Blood Culture results may not be optimal due to an inadequate volume of blood received in culture bottles   Culture   Final    NO GROWTH 5 DAYS Performed at [facility], [address] [place], [place] [address]    Report Status 08/21/2017 FINAL  Final  Culture, blood (routine x 2)     Status: None   Collection Time: 08/16/17  4:49 PM  Result Value Ref Range Status   Specimen Description BLOOD LEFT ANTECUBITAL  Final   Special Requests   Final    BOTTLES DRAWN AEROBIC AND ANAEROBIC Blood Culture results may not be optimal due to an inadequate volume of blood received in culture bottles   Culture   Final    NO GROWTH 5 DAYS Performed at [facility], [address] [place], [place] [address]    Report Status 08/21/2017 FINAL  Final      Scheduled Meds: . apixaban  5 mg Oral BID  . Chlorhexidine Gluconate Cloth  6 each Topical Q0600  . furosemide  20 mg Intravenous BID  . gabapentin  200 mg Oral TID  . insulin aspart  0-20 Units Subcutaneous TID WC  . insulin aspart  0-5 Units Subcutaneous QHS  . metoprolol tartrate  100 mg Oral BID  . mupirocin ointment  1 application Nasal BID  . potassium chloride SA  20 mEq Oral BID  . rosuvastatin  10 mg Oral Daily  . senna-docusate  1 tablet Oral BID  . sildenafil  20 mg Oral TID   Continuous Infusions: . sodium chloride      Assessment/Plan:  1. Acute on chronic hypoxic hypercapnic respiratory failure.  Patient  required BiPAP on presentation and now back to usual 4 L nasal cannula, patient has CPAP at night, chronic oxygen at home. 2. Atrial fibrillation with rapid ventricular response.  Patient is status post cardioversion and now in normal sinus rhythm.  Patient on Eliquis for anticoagulation and metoprolol for rate control.   3. Chest pain l due to atrial fibrillation.  Seen by cardiology.  Hyperlipidemia unspecified on Crestor 4. Chronic diastolic  congestive heart failure.  On IV Lasix for another 24 hours as per cardiology recommendation. 5. History of type 2 diabetes mellitus.  Likely can be off medications at this point since hemoglobin A1c is actually low. 6. Morbid obesity weight loss needed 7. Generalized weakness: Physical therapy consult requested.  Patient lives alone and she is very weak.  Further disposition based on physical therapy consult recommendation.  Code Status:     Code Status Orders  (From admission, onward)        Start     Ordered   08/16/17 1614  Full code  Continuous     08/16/17 1613    Code Status History    Date Active Date Inactive Code Status Order ID Comments User Context   03/30/2015 21:52 04/03/2015 21:22 Full Code [id number]  Etta Quill, DO Inpatient   03/27/2015 22:08 03/28/2015 17:37 Full Code [id number]  Nicholes Mango, MD ED   01/09/2014 14:18 01/10/2014 18:03 Full Code [id number]  Deboraha Sprang, MD Inpatient   01/16/2013 23:35 01/27/2013 20:11 Full Code [id number]  Collene Gobble, MD Inpatient     Family Communication: Family at bedside Disposition Plan: To be determined  Consultants:  Critical care specialist  Cardiology  Time spent: 28 minutes  [place]

## 2017-08-21 NOTE — Plan of Care (Signed)
  Progressing Clinical Measurements: Respiratory complications will improve 08/21/2017 2259 - Progressing by Liliane Channel, RN Cardiovascular complication will be avoided 08/21/2017 2259 - Progressing by Liliane Channel, RN Pain Managment: General experience of comfort will improve 08/21/2017 2259 - Progressing by Liliane Channel, RN Safety: Ability to remain free from injury will improve 08/21/2017 2259 - Progressing by Liliane Channel, RN

## 2017-08-21 NOTE — Care Management (Signed)
Transferred out of icu within last 24 hours. Had TEE cardioversion 2.28.  PT consult is pending. New Eliquis

## 2017-08-21 NOTE — Progress Notes (Signed)
Progress Note  Patient Name: Debra Saunders Date of Encounter: 08/21/2017  Primary Cardiologist: [name], MD  Subjective  She underwent successful TEE guided cardioversion yesterday.  It was complicated by oversedation.  She required BiPAP.  She continues to be in sinus rhythm this morning she is alert.  On nasal cannula.  She has not ambulated.  Inpatient Medications    Scheduled Meds: . apixaban  5 mg Oral BID  . Chlorhexidine Gluconate Cloth  6 each Topical Q0600  . furosemide  20 mg Intravenous BID  . gabapentin  200 mg Oral TID  . insulin aspart  0-20 Units Subcutaneous TID WC  . insulin aspart  0-5 Units Subcutaneous QHS  . metoprolol tartrate  50 mg Oral Q6H  . mupirocin ointment  1 application Nasal BID  . potassium chloride SA  20 mEq Oral BID  . rosuvastatin  10 mg Oral Daily  . senna-docusate  1 tablet Oral BID  . sildenafil  20 mg Oral TID   Continuous Infusions: . sodium chloride     PRN Meds: sodium chloride, acetaminophen, albuterol, nitroGLYCERIN, ondansetron (ZOFRAN) IV, oxyCODONE-acetaminophen, polyethylene glycol, promethazine   Vital Signs    Vitals:   08/20/17 1700 08/20/17 1756 08/20/17 1947 08/21/17 0345  BP: 97/87 (!) 118/98 107/65 122/71  Pulse: 60 60 60 60  Resp: 20 20 19 18   Temp:  98.5 F (36.9 C) 98.2 F (36.8 C) 98 F (36.7 C)  TempSrc:  Oral Oral   SpO2: 100% 99% 100% 100%  Weight:    244 lb 11.2 oz (111 kg)  Height:        Intake/Output Summary (Last 24 hours) at 08/21/2017 0857 Last data filed at 08/21/2017 0000 Gross per 24 hour  Intake 880 ml  Output 400 ml  Net 480 ml   Filed Weights   08/19/17 0500 08/20/17 0200 08/21/17 0345  Weight: 222 lb 0.1 oz (100.7 kg) 243 lb 6.2 oz (110.4 kg) 244 lb 11.2 oz (111 kg)    Physical Exam   GEN: obese, in no acute distress.  HEENT: Grossly normal.  Neck: Supple, no JVD, carotid bruits, or masses. Cardiac: RRR, no murmurs, rubs, or gallops. No clubbing, cyanosis.  Trace  nonpitting edema of left lower leg.  Radials/DP/PT 2+ and equal bilaterally.  Respiratory:  Respirations regular and unlabored, bibasilar crackles. GI: Soft, nontender, nondistended, BS + x 4. MS: no deformity or atrophy. Skin: warm and dry, no rash. Neuro: Complete neuro exam not performed but appears to be intact Psych: Alert and oriented x3.  Labs    Chemistry Recent Labs  Lab 08/16/17 1200  08/18/17 0418 08/18/17 1703 08/19/17 2841  NA  --    < > 135 132* 135  K  --    < > 4.0 4.1 4.2  CL  --    < > 100* 98* 100*  CO2  --    < > 26 24 27   GLUCOSE  --    < > 173* 133* 118*  BUN  --    < > 51* 52* 45*  CREATININE  --    < > 1.15* 1.06* 0.91  CALCIUM  --    < > 8.4* 8.7* 8.4*  PROT 8.0  --   --   --   --   ALBUMIN 3.8  --   --   --   --   AST 25  --   --   --   --  ALT 21  --   --   --   --   ALKPHOS 80  --   --   --   --   BILITOT 0.7  --   --   --   --   GFRNONAA  --    < > 46* 50* >60  GFRAA  --    < > 53* 58* >60  ANIONGAP  --    < > 9 10 8    < > = values in this interval not displayed.     Hematology Recent Labs  Lab 08/16/17 0925 08/17/17 0435  WBC 8.6 5.4  RBC 4.50 4.04  HGB 11.8* 10.6*  HCT 37.3 33.3*  MCV 82.9 82.6  MCH 26.2 26.4  MCHC 31.6* 31.9*  RDW 14.5 14.8*  PLT 230 219    Cardiac Enzymes Recent Labs  Lab 08/16/17 0925 08/16/17 1649 08/16/17 2147 08/17/17 0435  TROPONINI <0.03 <0.03 <0.03 <0.03      BNP Recent Labs  Lab 08/16/17 0925  BNP 655.0*      Radiology    No results found.  Telemetry    Sinus rhythm in atrial paced rhythm.- Personally Reviewed  Cardiac Studies   2D Echocardiogram 2.25.2019  Study Conclusions  - Left ventricle: The cavity size was mildly dilated. Systolic   function was moderately to severely reduced. The estimated   ejection fraction was in the range of 30% to 35%. Diffuse   hypokinesis. - Aortic valve: There was trivial regurgitation. Valve area (VTI):   2.25 cm^2. Valve area (Vmax):  1.67 cm^2. Valve area (Vmean): 1.92   cm^2. - Mitral valve: There was severe regurgitation. - Left atrium: The atrium was moderately dilated. - Right atrium: The atrium was mildly dilated. - Tricuspid valve: There was moderate regurgitation.  Patient Profile     72 y.o. 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  EF now 30-35%.  Assessment & Plan    1.  Persistent Afib:   She is maintaining in sinus and paced rhythm after TEE guided cardioversion yesterday.  He has I am going to change metoprolol to 100 mg twice daily.  Continue Eliquis 5 mg twice daily.   2.  COPD/ILD:  No active wheezing.   Suspected obesity hypoventilation, sleep apnea  3.  DVT s/p IVC Filter:  chronic, mild left lower leg swelling.    4.  HTN:   Stable/soft.  5.  Acute systolic CHF:  EF 37-04% by echo 2/25.   Ejection fraction will likely improve after cardioversion restoring normal sinus rhythm The patient continues to be mildly volume overloaded.  Continue IV furosemide likely for another day and switch to oral tomorrow.  The patient appears to be significantly deconditioned.  Recommend physical therapy consult.     Signed, [name], MD  08/21/2017, 8:57 AM    For questions or updates, please contact

## 2017-08-22 LAB — CBC
HCT: 33.1 % — ABNORMAL LOW (ref 35.0–47.0)
Hemoglobin: 10.4 g/dL — ABNORMAL LOW (ref 12.0–16.0)
MCH: 26.2 pg (ref 26.0–34.0)
MCHC: 31.3 g/dL — ABNORMAL LOW (ref 32.0–36.0)
MCV: 83.7 fL (ref 80.0–100.0)
Platelets: 187 10*3/uL (ref 150–440)
RBC: 3.95 MIL/uL (ref 3.80–5.20)
RDW: 14.4 % (ref 11.5–14.5)
WBC: 5.3 10*3/uL (ref 3.6–11.0)

## 2017-08-22 LAB — GLUCOSE, CAPILLARY
Glucose-Capillary: 112 mg/dL — ABNORMAL HIGH (ref 65–99)
Glucose-Capillary: 141 mg/dL — ABNORMAL HIGH (ref 65–99)
Glucose-Capillary: 108 mg/dL — ABNORMAL HIGH (ref 65–99)
Glucose-Capillary: 132 mg/dL — ABNORMAL HIGH (ref 65–99)

## 2017-08-22 LAB — BASIC METABOLIC PANEL
Anion gap: 7 (ref 5–15)
BUN: 27 mg/dL — ABNORMAL HIGH (ref 6–20)
CO2: 27 mmol/L (ref 22–32)
Calcium: 8.8 mg/dL — ABNORMAL LOW (ref 8.9–10.3)
Chloride: 104 mmol/L (ref 101–111)
Creatinine, Ser: 0.79 mg/dL (ref 0.44–1.00)
GFR calc Af Amer: >60 mL/min (ref >60)
GFR calc non Af Amer: >60 mL/min (ref >60)
Glucose, Bld: 126 mg/dL — ABNORMAL HIGH (ref 65–99)
Potassium: 5 mmol/L (ref 3.5–5.1)
Sodium: 138 mmol/L (ref 135–145)

## 2017-08-22 MED ORDER — SODIUM CHLORIDE 0.9% FLUSH
3.0000 mL | Freq: Two times a day (BID) | INTRAVENOUS | Status: DC
  Administered 2017-08-22 – 2017-08-28 (×12): 3 mL via INTRAVENOUS

## 2017-08-22 MED ORDER — LOSARTAN POTASSIUM 25 MG PO TABS
25.0000 mg | ORAL_TABLET | Freq: Every day | ORAL | Status: DC
  Administered 2017-08-22 – 2017-08-28 (×7): 25 mg via ORAL
  Filled 2017-08-22 (×7): qty 1

## 2017-08-22 MED ORDER — PANTOPRAZOLE SODIUM 40 MG PO TBEC
40.0000 mg | DELAYED_RELEASE_TABLET | Freq: Every day | ORAL | Status: DC
  Administered 2017-08-22 – 2017-08-28 (×7): 40 mg via ORAL
  Filled 2017-08-22 (×7): qty 1

## 2017-08-22 MED ORDER — CALCIUM CARBONATE ANTACID 500 MG PO CHEW
1.0000 | CHEWABLE_TABLET | Freq: Two times a day (BID) | ORAL | Status: DC
  Administered 2017-08-22 – 2017-08-26 (×8): 200 mg via ORAL
  Filled 2017-08-22 (×12): qty 1

## 2017-08-22 NOTE — Plan of Care (Signed)
  Progressing Education: Knowledge of General Education information will improve 08/22/2017 1603 - Progressing by Feliberto Gottron, RN Health Behavior/Discharge Planning: Ability to manage health-related needs will improve 08/22/2017 1603 - Progressing by Chriss Czar, Illene Bolus, RN Clinical Measurements: Ability to maintain clinical measurements within normal limits will improve 08/22/2017 1603 - Progressing by Feliberto Gottron, RN Respiratory complications will improve 08/22/2017 1603 - Progressing by Chriss Czar, Illene Bolus, RN Activity: Risk for activity intolerance will decrease 08/22/2017 1603 - Progressing by Feliberto Gottron, RN Note Patient sat up in the chair. Ambulated from bed to chair.   Nutrition: Adequate nutrition will be maintained 08/22/2017 1603 - Progressing by Chriss Czar, Illene Bolus, RN Safety: Ability to remain free from injury will improve 08/22/2017 1603 - Progressing by Chriss Czar, Illene Bolus, RN Skin Integrity: Risk for impaired skin integrity will decrease 08/22/2017 1603 - Progressing by Chriss Czar, Illene Bolus, RN Activity: Ability to tolerate increased activity will improve 08/22/2017 1603 - Progressing by Feliberto Gottron, RN

## 2017-08-22 NOTE — Progress Notes (Signed)
Physical Therapy Treatment Patient Details Name: Debra Saunders MRN: [id number] DOB: 03-04-1946 Today's Date: 08/22/2017    History of Present Illness 71 y/o female with ~week of shortness of breath and chest pain.  She was very tachcardic, 100-120bpm t/o most of PT exam. Pt uses 4 liters O2 at home 24/7.      PT Comments    Pt was sitting in chair upon PT arrival.  Was able to stand with multiple attempts but without physical assist from PT.  PT provided VC's for proper body mechanics when standing with RW.  Pt attempted to ambulate 2 laps in room but became too fatigued to continue following the first lap. O2 sats: 94%  In seated position.  Pt was able to manage RW during ambulation but presented with gait deviations and TUG  Time indicative of fall risk.  PT re-assessed ankle PF and great toe extension, providing resistance manually.  Pt presented with weakness of great toe flexors indicative of fall risk as well.  She also reported pain in her L LL which has increased in the past few days since pt has not been as mobile as she is used to being.  Pt is very pleasant and states that she wants to do whatever it takes to regain her strength.  Pt will continue to benefit from skilled PT with focus on strength, tolerance to activity, functional mobility, balance and pain management.     Follow Up Recommendations  SNF     Equipment Recommendations       Recommendations for Other Services       Precautions / Restrictions Precautions Precautions: Fall Restrictions Weight Bearing Restrictions: No    Mobility  Bed Mobility Overal bed mobility: (Pt in chair upon PT arrival.  Did not perform bed mobility.)                Transfers Overall transfer level: Needs assistance Equipment used: Rolling walker (2 wheeled) Transfers: Sit to/from Stand Sit to Stand: Min guard         General transfer comment: Performed STS requiring multiple attempts with PT VC for placement for optimal  body mechanics.  Pt did not require physical assist for STS.  Ambulation/Gait Ambulation/Gait assistance: Min guard Ambulation Distance (Feet): 30 Feet Assistive device: Rolling walker (2 wheeled)     Gait velocity interpretation: Below normal speed for age/gender General Gait Details: Pt ambulated with RW and PT CGA. Pt appeared unsteady and required lateral wt displacement to initate swing phase; became fatigued following 15 ft of ambulation and requested to sit down.   Stairs            Wheelchair Mobility    Modified Rankin (Stroke Patients Only)       Balance Overall balance assessment: Needs assistance Sitting-balance support: Bilateral upper extremity supported       Standing balance support: Bilateral upper extremity supported;During functional activity   Standing balance comment: Uses RW for balance and requires close CGA                 Standardized Balance Assessment Standardized Balance Assessment : TUG: Timed Up and Go Test     Timed Up and Go Test TUG: Normal TUG(with RW) Normal TUG (seconds): 17    Cognition Arousal/Alertness: Awake/alert Behavior During Therapy: WFL for tasks assessed/performed Overall Cognitive Status: Within Functional Limits for tasks assessed  Exercises Other Exercises Other Exercises: STS x4 with use of bilateral UE and VC's for proper hand placement on RW. Other Exercises: manual resisted ankle PF and 1st digit toe extension x 5seconds    General Comments        Pertinent Vitals/Pain Pain Assessment: Faces Faces Pain Scale: Hurts a little bit Pain Location: Upper abdominal area    Home Living                      Prior Function            PT Goals (current goals can now be found in the care plan section) Acute Rehab PT Goals Patient Stated Goal: To regain strength to go home and be able to enter home and step up onto her bed. PT Goal  Formulation: With patient Time For Goal Achievement: 09/05/17 Potential to Achieve Goals: Good Progress towards PT goals: Not progressing toward goals - comment(Pt is unable to ambulate distance achieved during evaluation 4 days ago.)    Frequency    Min 2X/week      PT Plan Current plan remains appropriate    Co-evaluation              AM-PAC PT "6 Clicks" Daily Activity  Outcome Measure  Difficulty turning over in bed (including adjusting bedclothes, sheets and blankets)?: A Little Difficulty moving from lying on back to sitting on the side of the bed? : A Little Difficulty sitting down on and standing up from a chair with arms (e.g., wheelchair, bedside commode, etc,.)?: A Little Help needed moving to and from a bed to chair (including a wheelchair)?: A Little Help needed walking in hospital room?: A Little Help needed climbing 3-5 steps with a railing? : A Little 6 Click Score: 18    End of Session Equipment Utilized During Treatment: Gait belt;Oxygen Activity Tolerance: Patient limited by fatigue Patient left: in chair;with chair alarm set;with family/visitor present;with call bell/phone within reach;with nursing/sitter in room Nurse Communication: Mobility status PT Visit Diagnosis: Unsteadiness on feet (R26.81);Muscle weakness (generalized) (M62.81);Pain Pain - Right/Left: Left Pain - part of body: Leg     Time: 6294-7654 PT Time Calculation (min) (ACUTE ONLY): 23 min  Charges:  $Therapeutic Exercise: 8-22 mins $Therapeutic Activity: 8-22 mins                    G Codes:  Functional Assessment Tool Used: AM-PAC 6 Clicks Basic Mobility    Roxanne Gates, PT, DPT    Roxanne Gates 08/22/2017, 5:37 PM

## 2017-08-22 NOTE — Progress Notes (Signed)
Progress Note  Patient Name: Debra Saunders Date of Encounter: 08/22/2017  Primary Cardiologist: [name], MD   Subjective   Dyspnea improving; mild lower right chest/upper abdominal pain  Inpatient Medications    Scheduled Meds: . apixaban  5 mg Oral BID  . furosemide  20 mg Intravenous BID  . gabapentin  200 mg Oral TID  . insulin aspart  0-20 Units Subcutaneous TID WC  . insulin aspart  0-5 Units Subcutaneous QHS  . metoprolol tartrate  100 mg Oral BID  . potassium chloride SA  20 mEq Oral BID  . rosuvastatin  10 mg Oral Daily  . senna-docusate  1 tablet Oral BID  . sildenafil  20 mg Oral TID   Continuous Infusions: . sodium chloride     PRN Meds: sodium chloride, acetaminophen, albuterol, nitroGLYCERIN, ondansetron (ZOFRAN) IV, oxyCODONE-acetaminophen, polyethylene glycol, promethazine   Vital Signs    Vitals:   08/21/17 2046 08/21/17 2143 08/22/17 0327 08/22/17 0800  BP: (!) 112/54 111/60 (!) 147/71 136/78  Pulse: 61 65 63 (!) 56  Resp: (!) 22  18   Temp: 98.4 F (36.9 C)  98.3 F (36.8 C) 97.6 F (36.4 C)  TempSrc: Oral  Oral Oral  SpO2: 100%  100% 100%  Weight:   227 lb 6.4 oz (103.1 kg)   Height:        Intake/Output Summary (Last 24 hours) at 08/22/2017 0928 Last data filed at 08/22/2017 5277 Gross per 24 hour  Intake 120 ml  Output 2400 ml  Net -2280 ml   Filed Weights   08/20/17 0200 08/21/17 0345 08/22/17 0327  Weight: 243 lb 6.2 oz (110.4 kg) 244 lb 11.2 oz (111 kg) 227 lb 6.4 oz (103.1 kg)    Telemetry    Atrial paced rhythm- Personally Reviewed   Physical Exam   GEN: No acute distress.   Neck: No JVD Cardiac: RRR, no murmurs, rubs, or gallops.  Respiratory: Clear to auscultation bilaterally. GI: Soft, nontender, non-distended  MS: Trace edema Neuro:  Nonfocal  Psych: Normal affect   Labs    Chemistry Recent Labs  Lab 08/16/17 1200  08/18/17 1703 08/19/17 0632 08/22/17 0420  NA  --    < > 132* 135 138  K  --     < > 4.1 4.2 5.0  CL  --    < > 98* 100* 104  CO2  --    < > 24 27 27   GLUCOSE  --    < > 133* 118* 126*  BUN  --    < > 52* 45* 27*  CREATININE  --    < > 1.06* 0.91 0.79  CALCIUM  --    < > 8.7* 8.4* 8.8*  PROT 8.0  --   --   --   --   ALBUMIN 3.8  --   --   --   --   AST 25  --   --   --   --   ALT 21  --   --   --   --   ALKPHOS 80  --   --   --   --   BILITOT 0.7  --   --   --   --   GFRNONAA  --    < > 50* >60 >60  GFRAA  --    < > 58* >60 >60  ANIONGAP  --    < > 10 8 7    < > =  values in this interval not displayed.     Hematology Recent Labs  Lab 08/16/17 0925 08/17/17 0435 08/22/17 0420  WBC 8.6 5.4 5.3  RBC 4.50 4.04 3.95  HGB 11.8* 10.6* 10.4*  HCT 37.3 33.3* 33.1*  MCV 82.9 82.6 83.7  MCH 26.2 26.4 26.2  MCHC 31.6* 31.9* 31.3*  RDW 14.5 14.8* 14.4  PLT 230 219 187    Cardiac Enzymes Recent Labs  Lab 08/16/17 0925 08/16/17 1649 08/16/17 2147 08/17/17 0435  TROPONINI <0.03 <0.03 <0.03 <0.03    BNP Recent Labs  Lab 08/16/17 0925  BNP 655.0*     Patient Profile     72 y.o.femalewith a hx of nonobstructive CAD by prior LHC, chronic diastolic CHF, symptomatic bradycardia s/p PPM, frequent PVCs, chronic respiratory failure with hypoxia on home oxygen secondary to COPD, HTN, HLD, DM2, prior SAH in the setting of [place] for prior DVT now s/p filter placement, chronic right-sided atypical chest pain, lymphedema, stroke, OSA, and obesity, who was admitted 2/24 with dyspnea and AFib RVR.  EF now 30-35%.    Assessment & Plan    1 persistent atrial fibrillation-patient remains in sinus rhythm status post TEE guided cardioversion.  Continue apixaban 5 mg twice daily.  Continue metoprolol 100 mg twice daily for rate control if atrial fibrillation recurs.  If she has more frequent episodes in the future she would require an antiarrhythmic.  2 cardiomyopathy-etiology unclear.  Possibly secondary to atrial fibrillation with tachycardia.  We will continue  beta-blocker.  If ejection fraction remains reduced would change to Toprol in the future.  I will add Cozaar 25 mg daily.  She will need follow-up echocardiogram in 3 months at to see if LV function has improved.  If not would consider ischemia evaluation.  3 acute systolic congestive heart failure-likely contribution from atrial fibrillation.  She continues to improve now that she is in sinus rhythm.  We will continue Lasix at present dose.  Follow renal function.  I will discontinue potassium at this point and follow as it is 5.1 today.  4 hypertension-blood pressure is controlled.  Continue present medications.  5 COPD/interstitial lung disease-continue home oxygen.  Management per primary care.  6 prior pacemaker  For questions or updates, please contact Roy Please consult [email or web address] for contact info under Cardiology/STEMI.      Signed, Kirk Ruths, MD  08/22/2017, 9:28 AM

## 2017-08-22 NOTE — Progress Notes (Signed)
Patient ID: Debra Saunders, female   DOB: 1945/10/09, 72 y.o.   MRN: [id number]  [facility] PROGRESS NOTE  Debra Saunders DXA:[id number] DOB: [birth year] DOA: 08/16/2017 PCP: [name], MD  HPI/Subjective: The patient has no shortness of breath.  Complains of mild abdominal pain today. PObjective: Vitals:   08/22/17 0800 08/22/17 0942  BP: 136/78   Pulse: (!) 56 69  Resp:    Temp: 97.6 F (36.4 C)   SpO2: 100%     Filed Weights   08/20/17 0200 08/21/17 0345 08/22/17 0327  Weight: 110.4 kg (243 lb 6.2 oz) 111 kg (244 lb 11.2 oz) 103.1 kg (227 lb 6.4 oz)    ROS: Review of Systems  Constitutional: Negative for chills and fever.  HENT: Negative for sore throat.   Eyes: Negative for blurred vision.  Respiratory: Negative for cough and shortness of breath.   Cardiovascular: Negative for chest pain.  Gastrointestinal: Negative for abdominal pain, constipation, diarrhea, nausea and vomiting.  Genitourinary: Negative for dysuria.  Musculoskeletal: Negative for joint pain.  Neurological: Negative for dizziness and headaches.   Exam: Physical Exam  Constitutional: She is oriented to person, place, and time.  HENT:  Nose: No mucosal edema.  Mouth/Throat: No oropharyngeal exudate or posterior oropharyngeal edema.  Eyes: Conjunctivae, EOM and lids are normal. Pupils are equal, round, and reactive to light.  Neck: No JVD present. Carotid bruit is not present. No edema present. No thyroid mass and no thyromegaly present.  Cardiovascular: Regular rhythm, S1 normal and S2 normal. Exam reveals no gallop.  No murmur heard. Pulses:      Dorsalis pedis pulses are 2+ on the right side, and 2+ on the left side.  Respiratory: No respiratory distress. She has no decreased breath sounds. She has no wheezes. She has no rhonchi. She has no rales.  GI: Soft. Bowel sounds are normal. There is no tenderness.  Musculoskeletal:       Right ankle: She exhibits swelling.       Left ankle:  She exhibits swelling.  Lymphadenopathy:    She has no cervical adenopathy.  Neurological: She is alert and oriented to person, place, and time. No cranial nerve deficit.  Skin: Skin is warm. No rash noted. Nails show no clubbing.  Psychiatric: She has a normal mood and affect.      Data Reviewed: Basic Metabolic Panel: Recent Labs  Lab 08/17/17 0435 08/18/17 0418 08/18/17 1703 08/19/17 0632 08/22/17 0420  NA 133* 135 132* 135 138  K 4.0 4.0 4.1 4.2 5.0  CL 100* 100* 98* 100* 104  CO2 24 26 24 27 27   GLUCOSE 195* 173* 133* 118* 126*  BUN 42* 51* 52* 45* 27*  CREATININE 1.25* 1.15* 1.06* 0.91 0.79  CALCIUM 8.4* 8.4* 8.7* 8.4* 8.8*  MG 1.6* 2.7*  --   --   --    Liver Function Tests: Recent Labs  Lab 08/16/17 1200  AST 25  ALT 21  ALKPHOS 80  BILITOT 0.7  PROT 8.0  ALBUMIN 3.8   Recent Labs  Lab 08/16/17 1200 08/16/17 1441  LIPASE 28 27   No results for input(s): AMMONIA in the last 168 hours. CBC: Recent Labs  Lab 08/16/17 0925 08/17/17 0435 08/22/17 0420  WBC 8.6 5.4 5.3  HGB 11.8* 10.6* 10.4*  HCT 37.3 33.3* 33.1*  MCV 82.9 82.6 83.7  PLT 230 219 187   Cardiac Enzymes: Recent Labs  Lab 08/16/17 0925 08/16/17 1649 08/16/17 2147 08/17/17 0435  TROPONINI <0.03 <0.03 <0.03 <0.03   BNP (last 3 results) Recent Labs    08/04/17 1552 08/16/17 0925  BNP 175.0* 655.0*    ProBNP (last 3 results) No results for input(s): PROBNP in the last 8760 hours.  CBG: Recent Labs  Lab 08/21/17 1142 08/21/17 1644 08/21/17 2046 08/22/17 0758 08/22/17 1157  GLUCAP 146* 115* 175* 112* 141*    Recent Results (from the past 240 hour(s))  MRSA PCR Screening     Status: Abnormal   Collection Time: 08/16/17  1:39 PM  Result Value Ref Range Status   MRSA by PCR POSITIVE (A) NEGATIVE Final    Comment:        [name] (FDA approved for NASAL specimens only), is one component of a comprehensive MRSA colonization surveillance program. It  is not intended to diagnose MRSA infection nor to guide or monitor treatment for MRSA infections. RESULT CALLED TO, READ BACK BY AND VERIFIED WITH: BRITTNEY RUDD @ 7858 ON 08/16/2017 BY CAF Performed at [facility], [address] [place], [place] [address]   Culture, blood (routine x 2)     Status: None   Collection Time: 08/16/17  4:49 PM  Result Value Ref Range Status   Specimen Description BLOOD LEFT ANTECUBITAL  Final   Special Requests   Final    BOTTLES DRAWN AEROBIC AND ANAEROBIC Blood Culture results may not be optimal due to an inadequate volume of blood received in culture bottles   Culture   Final    NO GROWTH 5 DAYS Performed at [facility], [address] [place], [place] [address]    Report Status 08/21/2017 FINAL  Final  Culture, blood (routine x 2)     Status: None   Collection Time: 08/16/17  4:49 PM  Result Value Ref Range Status   Specimen Description BLOOD LEFT ANTECUBITAL  Final   Special Requests   Final    BOTTLES DRAWN AEROBIC AND ANAEROBIC Blood Culture results may not be optimal due to an inadequate volume of blood received in culture bottles   Culture   Final    NO GROWTH 5 DAYS Performed at [facility], [address], [place], [place] [address]    Report Status 08/21/2017 FINAL  Final      Scheduled Meds: . apixaban  5 mg Oral BID  . furosemide  20 mg Intravenous BID  . gabapentin  200 mg Oral TID  . insulin aspart  0-20 Units Subcutaneous TID WC  . insulin aspart  0-5 Units Subcutaneous QHS  . losartan  25 mg Oral Daily  . metoprolol tartrate  100 mg Oral BID  . rosuvastatin  10 mg Oral Daily  . senna-docusate  1 tablet Oral BID  . sildenafil  20 mg Oral TID   Continuous Infusions: . sodium chloride      Assessment/Plan:  1. Acute on chronic hypoxic hypercapnic respiratory failure.  Patient required BiPAP on presentation and now back to usual 4 L nasal cannula, patient has CPAP at night, chronic  oxygen at home 2. Atrial fibrillation with rapid ventricular response.  Patient is status post cardioversion and now in normal sinus rhythm.  Patient on Eliquis for anticoagulation and metoprolol for rate control. Appreciate cardiology following.  Regarding her cardiomyopathy patient is to be on beta-blockers, Cozaar, patient needs echo in 3 months for LV function evaluation and follow-up.  3. Chest pain ldue to atrial fibrillation.  Seen by cardiology.  Hyperlipidemia unspecified on Crestor  4. Chronic diastolic congestive heart failure.  Continue IV Lasix at present dose as per cardiology recommendation.  Mild hyperkalemia so we discontinued potassium supplements, monitor kidney function closely.   5. History of type 2 diabetes mellitus.   at this point since hemoglobin A1c is actually low. 6. Morbid obesity weight loss needed 7. Generalized weakness: Physical therapy consult requested.  Patient lives alone and she is very weak.  Further disposition based on physical therapy consult recommendation. #8 mild abdominal pain likely gastritis: Patient can take Tums.,  Add PPI.  Continue MiraLAX for constipation. Code Status:     Code Status Orders  (From admission, onward)        Start     Ordered   08/16/17 1614  Full code  Continuous     08/16/17 1613    Code Status History    Date Active Date Inactive Code Status Order ID Comments User Context   03/30/2015 21:52 04/03/2015 21:22 Full Code [id number]  Etta Quill, DO Inpatient   03/27/2015 22:08 03/28/2015 17:37 Full Code [id number]  Nicholes Mango, MD ED   01/09/2014 14:18 01/10/2014 18:03 Full Code [id number]  Deboraha Sprang, MD Inpatient   01/16/2013 23:35 01/27/2013 20:11 Full Code [id number]  Collene Gobble, MD Inpatient     Family Communication: Family at bedside Disposition Plan: To be determined  Consultants:  Critical care specialist  Cardiology  Time spent: 28 minutes  [place]

## 2017-08-23 DIAGNOSIS — I480 Paroxysmal atrial fibrillation: Secondary | ICD-10-CM

## 2017-08-23 LAB — BASIC METABOLIC PANEL
Anion gap: 10 (ref 5–15)
BUN: 20 mg/dL (ref 6–20)
CO2: 28 mmol/L (ref 22–32)
Calcium: 8.9 mg/dL (ref 8.9–10.3)
Chloride: 101 mmol/L (ref 101–111)
Creatinine, Ser: 0.65 mg/dL (ref 0.44–1.00)
GFR calc Af Amer: >60 mL/min (ref >60)
GFR calc non Af Amer: >60 mL/min (ref >60)
Glucose, Bld: 128 mg/dL — ABNORMAL HIGH (ref 65–99)
Potassium: 4 mmol/L (ref 3.5–5.1)
Sodium: 139 mmol/L (ref 135–145)

## 2017-08-23 LAB — GLUCOSE, CAPILLARY
Glucose-Capillary: 113 mg/dL — ABNORMAL HIGH (ref 65–99)
Glucose-Capillary: 178 mg/dL — ABNORMAL HIGH (ref 65–99)
Glucose-Capillary: 115 mg/dL — ABNORMAL HIGH (ref 65–99)
Glucose-Capillary: 149 mg/dL — ABNORMAL HIGH (ref 65–99)

## 2017-08-23 LAB — MAGNESIUM: Magnesium: 1.6 mg/dL — ABNORMAL LOW (ref 1.7–2.4)

## 2017-08-23 MED ORDER — POTASSIUM CHLORIDE CRYS ER 20 MEQ PO TBCR
20.0000 meq | EXTENDED_RELEASE_TABLET | Freq: Every day | ORAL | Status: DC
  Administered 2017-08-23 – 2017-08-28 (×6): 20 meq via ORAL
  Filled 2017-08-23 (×6): qty 1

## 2017-08-23 MED ORDER — DIGOXIN 125 MCG PO TABS
0.1250 mg | ORAL_TABLET | Freq: Every day | ORAL | Status: DC
  Administered 2017-08-23 – 2017-08-28 (×6): 0.125 mg via ORAL
  Filled 2017-08-23 (×6): qty 1

## 2017-08-23 MED ORDER — SPIRONOLACTONE 25 MG PO TABS
12.5000 mg | ORAL_TABLET | Freq: Every day | ORAL | Status: DC
  Administered 2017-08-23 – 2017-08-28 (×6): 12.5 mg via ORAL
  Filled 2017-08-23: qty 0.5
  Filled 2017-08-23 (×2): qty 1
  Filled 2017-08-23 (×3): qty 0.5
  Filled 2017-08-23: qty 1
  Filled 2017-08-23: qty 0.5
  Filled 2017-08-23 (×2): qty 1
  Filled 2017-08-23: qty 0.5

## 2017-08-23 MED ORDER — METOPROLOL SUCCINATE ER 100 MG PO TB24
200.0000 mg | ORAL_TABLET | Freq: Every day | ORAL | Status: DC
  Administered 2017-08-23 – 2017-08-28 (×6): 200 mg via ORAL
  Filled 2017-08-23 (×7): qty 2

## 2017-08-23 NOTE — Progress Notes (Signed)
Patient ID: Debra Saunders, female   DOB: May 21, 1946, 72 y.o.   MRN: [id number]  [facility] PROGRESS NOTE  Debra Saunders:[id number] DOB: [birth year] DOA: 08/16/2017 PCP: [name], MD  HPI/Subjective: No complaints.. PObjective: Vitals:   08/23/17 0427 08/23/17 0815  BP: 127/66 122/61  Pulse: (!) 42 64  Resp: 18   Temp: 97.9 F (36.6 C) 98 F (36.7 C)  SpO2: 92% 93%    Filed Weights   08/21/17 0345 08/22/17 0327 08/23/17 0426  Weight: 111 kg (244 lb 11.2 oz) 103.1 kg (227 lb 6.4 oz) 101.2 kg (223 lb 1.6 oz)    ROS: Review of Systems  Constitutional: Negative for chills and fever.  HENT: Negative for sore throat.   Eyes: Negative for blurred vision.  Respiratory: Negative for cough and shortness of breath.   Cardiovascular: Negative for chest pain.  Gastrointestinal: Negative for abdominal pain, constipation, diarrhea, nausea and vomiting.  Genitourinary: Negative for dysuria.  Musculoskeletal: Negative for joint pain.  Neurological: Negative for dizziness and headaches.   Exam: Physical Exam  Constitutional: She is oriented to person, place, and time.  HENT:  Nose: No mucosal edema.  Mouth/Throat: No oropharyngeal exudate or posterior oropharyngeal edema.  Eyes: Conjunctivae, EOM and lids are normal. Pupils are equal, round, and reactive to light.  Neck: No JVD present. Carotid bruit is not present. No edema present. No thyroid mass and no thyromegaly present.  Cardiovascular: Regular rhythm, S1 normal and S2 normal. Exam reveals no gallop.  No murmur heard. Pulses:      Dorsalis pedis pulses are 2+ on the right side, and 2+ on the left side.  Respiratory: No respiratory distress. She has no decreased breath sounds. She has no wheezes. She has no rhonchi. She has no rales.  GI: Soft. Bowel sounds are normal. There is no tenderness.  Musculoskeletal:       Right ankle: She exhibits swelling.       Left ankle: She exhibits swelling.   Lymphadenopathy:    She has no cervical adenopathy.  Neurological: She is alert and oriented to person, place, and time. No cranial nerve deficit.  Skin: Skin is warm. No rash noted. Nails show no clubbing.  Psychiatric: She has a normal mood and affect.      Data Reviewed: Basic Metabolic Panel: Recent Labs  Lab 08/17/17 0435 08/18/17 0418 08/18/17 1703 08/19/17 0632 08/22/17 0420 08/23/17 0530  NA 133* 135 132* 135 138 139  K 4.0 4.0 4.1 4.2 5.0 4.0  CL 100* 100* 98* 100* 104 101  CO2 24 26 24 27 27 28   GLUCOSE 195* 173* 133* 118* 126* 128*  BUN 42* 51* 52* 45* 27* 20  CREATININE 1.25* 1.15* 1.06* 0.91 0.79 0.65  CALCIUM 8.4* 8.4* 8.7* 8.4* 8.8* 8.9  MG 1.6* 2.7*  --   --   --  1.6*   Liver Function Tests: Recent Labs  Lab 08/16/17 1200  AST 25  ALT 21  ALKPHOS 80  BILITOT 0.7  PROT 8.0  ALBUMIN 3.8   Recent Labs  Lab 08/16/17 1200 08/16/17 1441  LIPASE 28 27   No results for input(s): AMMONIA in the last 168 hours. CBC: Recent Labs  Lab 08/17/17 0435 08/22/17 0420  WBC 5.4 5.3  HGB 10.6* 10.4*  HCT 33.3* 33.1*  MCV 82.6 83.7  PLT 219 187   Cardiac Enzymes: Recent Labs  Lab 08/16/17 1649 08/16/17 2147 08/17/17 0435  TROPONINI <0.03 <0.03 <0.03   BNP (  last 3 results) Recent Labs    08/04/17 1552 08/16/17 0925  BNP 175.0* 655.0*    ProBNP (last 3 results) No results for input(s): PROBNP in the last 8760 hours.  CBG: Recent Labs  Lab 08/22/17 0758 08/22/17 1157 08/22/17 1726 08/22/17 2019 08/23/17 0759  GLUCAP 112* 141* 108* 132* 113*    Recent Results (from the past 240 hour(s))  MRSA PCR Screening     Status: Abnormal   Collection Time: 08/16/17  1:39 PM  Result Value Ref Range Status   MRSA by PCR POSITIVE (A) NEGATIVE Final    Comment:        [name] (FDA approved for NASAL specimens only), is one component of a comprehensive MRSA colonization surveillance program. It is not intended to diagnose  MRSA infection nor to guide or monitor treatment for MRSA infections. RESULT CALLED TO, READ BACK BY AND VERIFIED WITH: BRITTNEY RUDD @ 7564 ON 08/16/2017 BY CAF Performed at [facility], [address] [place], [place] [address]   Culture, blood (routine x 2)     Status: None   Collection Time: 08/16/17  4:49 PM  Result Value Ref Range Status   Specimen Description BLOOD LEFT ANTECUBITAL  Final   Special Requests   Final    BOTTLES DRAWN AEROBIC AND ANAEROBIC Blood Culture results may not be optimal due to an inadequate volume of blood received in culture bottles   Culture   Final    NO GROWTH 5 DAYS Performed at [facility], [address] [place], [place] [address]    Report Status 08/21/2017 FINAL  Final  Culture, blood (routine x 2)     Status: None   Collection Time: 08/16/17  4:49 PM  Result Value Ref Range Status   Specimen Description BLOOD LEFT ANTECUBITAL  Final   Special Requests   Final    BOTTLES DRAWN AEROBIC AND ANAEROBIC Blood Culture results may not be optimal due to an inadequate volume of blood received in culture bottles   Culture   Final    NO GROWTH 5 DAYS Performed at [facility], [address] [place], [place] [address]    Report Status 08/21/2017 FINAL  Final      Scheduled Meds: . apixaban  5 mg Oral BID  . calcium carbonate  1 tablet Oral BID  . digoxin  0.125 mg Oral Daily  . furosemide  20 mg Intravenous BID  . gabapentin  200 mg Oral TID  . insulin aspart  0-20 Units Subcutaneous TID WC  . insulin aspart  0-5 Units Subcutaneous QHS  . losartan  25 mg Oral Daily  . metoprolol succinate  200 mg Oral Daily  . pantoprazole  40 mg Oral Daily  . potassium chloride  20 mEq Oral Daily  . rosuvastatin  10 mg Oral Daily  . senna-docusate  1 tablet Oral BID  . sildenafil  20 mg Oral TID  . sodium chloride flush  3 mL Intravenous Q12H  . spironolactone  12.5 mg Oral Daily   Continuous Infusions: . sodium  chloride      Assessment/Plan:  1. Acute on chronic hypoxic hypercapnic respiratory failure.  Patient required BiPAP on presentation and now back to usual 4 L nasal cannula, patient has CPAP at night, chronic oxygen at home 2. Atrial fibrillation with rapid ventricular response.  Patient is status post cardioversion and now in normal sinus rhythm.  Patient on Eliquis for anticoagulation and metoprolol..  Patient  is started on Aldactone today. Appreciate cardiology following.  Regarding her cardiomyopathy patient is to be on beta-blockers, Cozaar, patient needs echo in 3 months for LV function evaluation and follow-up.  3. Chest pain ldue to atrial fibrillation.  Seen by cardiology.  Hyperlipidemia unspecified on Crestor 4. Chronic diastolic congestive heart failure.  Continue IV Lasix at present dose as per cardiology recommendation.  Mild hyperkalemia so we discontinued potassium supplements, monitor kidney function closely.  Change to p.o. Lasix tomorrow. 5. History of type 2 diabetes mellitus.   at this point since hemoglobin A1c is actually low. 6. Morbid obesity weight loss needed 7. Generalized weakness: Physical therapy,recommended  skilled nursing..  Patient lives alone and she is very weak.  Further disposition based on physical therapy consult recommendation. #8 mild abdominal pain likely gastritis: Patient can take Tums.,  Add PPI.  Continue MiraLAX for constipation. COPD, chronic interstitial lung disease: On chronic oxygen at home.  No wheezing. Code Status:     Code Status Orders  (From admission, onward)        Start     Ordered   08/16/17 1614  Full code  Continuous     08/16/17 1613    Code Status History    Date Active Date Inactive Code Status Order ID Comments User Context   03/30/2015 21:52 04/03/2015 21:22 Full Code [id number]  Etta Quill, DO Inpatient   03/27/2015 22:08 03/28/2015 17:37 Full Code [id number]  Nicholes Mango, MD ED   01/09/2014 14:18 01/10/2014 18:03  Full Code [id number]  Deboraha Sprang, MD Inpatient   01/16/2013 23:35 01/27/2013 20:11 Full Code [id number]  Collene Gobble, MD Inpatient     Family Communication: Family at bedside Disposition Plan: To be determined  Consultants:  Critical care specialist  Cardiology  Time spent: 28 minutes  [place]

## 2017-08-23 NOTE — Plan of Care (Signed)
  Not Progressing Activity: Risk for activity intolerance will decrease 08/23/2017 2236 - Not Progressing by Marylouise Stacks, RN Pain Managment: General experience of comfort will improve 08/23/2017 2236 - Not Progressing by Marylouise Stacks, RN   Patient still too weak to complete ADL's without help.  Still complaining of pain. Receiving percocet PRN.

## 2017-08-23 NOTE — Progress Notes (Signed)
Notified Dr. Estanislado Pandy of 8 second run of SVT. Patient asymptomatic, sleeping quietly in bed. Ordered to check mg and potassium with morning labs.

## 2017-08-23 NOTE — Progress Notes (Signed)
Progress Note  Patient Name: Debra Saunders Date of Encounter: 08/23/2017  Primary Cardiologist: [name], MD   Subjective   Mild dyspnea unchanged since yesterday; no chest pain  Inpatient Medications    Scheduled Meds: . apixaban  5 mg Oral BID  . calcium carbonate  1 tablet Oral BID  . furosemide  20 mg Intravenous BID  . gabapentin  200 mg Oral TID  . insulin aspart  0-20 Units Subcutaneous TID WC  . insulin aspart  0-5 Units Subcutaneous QHS  . losartan  25 mg Oral Daily  . metoprolol tartrate  100 mg Oral BID  . pantoprazole  40 mg Oral Daily  . rosuvastatin  10 mg Oral Daily  . senna-docusate  1 tablet Oral BID  . sildenafil  20 mg Oral TID  . sodium chloride flush  3 mL Intravenous Q12H   Continuous Infusions: . sodium chloride     PRN Meds: sodium chloride, acetaminophen, albuterol, nitroGLYCERIN, ondansetron (ZOFRAN) IV, oxyCODONE-acetaminophen, polyethylene glycol, promethazine   Vital Signs    Vitals:   08/22/17 1729 08/22/17 1921 08/23/17 0426 08/23/17 0427  BP: 120/88 131/64  127/66  Pulse: (!) 59 (!) 59  (!) 42  Resp:  18  18  Temp: 98.5 F (36.9 C) 98.3 F (36.8 C)  97.9 F (36.6 C)  TempSrc: Oral Oral  Oral  SpO2: 100% 100%  92%  Weight:   223 lb 1.6 oz (101.2 kg)   Height:        Intake/Output Summary (Last 24 hours) at 08/23/2017 0901 Last data filed at 08/23/2017 0426 Gross per 24 hour  Intake -  Output 3200 ml  Net -3200 ml   Filed Weights   08/21/17 0345 08/22/17 0327 08/23/17 0426  Weight: 244 lb 11.2 oz (111 kg) 227 lb 6.4 oz (103.1 kg) 223 lb 1.6 oz (101.2 kg)    Telemetry    Recurrent atrial fibrillation- Personally Reviewed   Physical Exam   GEN: WD, obese No acute distress.   Neck: supple Cardiac: irregular Respiratory: Mild basilar crackles GI: Soft, nontender, non-distended  MS: Trace edema (L>R) Neuro:  Grossly intact   Labs    Chemistry Recent Labs  Lab 08/16/17 1200  08/19/17 0632  08/22/17 0420 08/23/17 0530  NA  --    < > 135 138 139  K  --    < > 4.2 5.0 4.0  CL  --    < > 100* 104 101  CO2  --    < > 27 27 28   GLUCOSE  --    < > 118* 126* 128*  BUN  --    < > 45* 27* 20  CREATININE  --    < > 0.91 0.79 0.65  CALCIUM  --    < > 8.4* 8.8* 8.9  PROT 8.0  --   --   --   --   ALBUMIN 3.8  --   --   --   --   AST 25  --   --   --   --   ALT 21  --   --   --   --   ALKPHOS 80  --   --   --   --   BILITOT 0.7  --   --   --   --   GFRNONAA  --    < > >60 >60 >60  GFRAA  --    < > >60 >60 >60  ANIONGAP  --    < > 8 7 10    < > = values in this interval not displayed.     Hematology Recent Labs  Lab 08/16/17 0925 08/17/17 0435 08/22/17 0420  WBC 8.6 5.4 5.3  RBC 4.50 4.04 3.95  HGB 11.8* 10.6* 10.4*  HCT 37.3 33.3* 33.1*  MCV 82.9 82.6 83.7  MCH 26.2 26.4 26.2  MCHC 31.6* 31.9* 31.3*  RDW 14.5 14.8* 14.4  PLT 230 219 187    Cardiac Enzymes Recent Labs  Lab 08/16/17 0925 08/16/17 1649 08/16/17 2147 08/17/17 0435  TROPONINI <0.03 <0.03 <0.03 <0.03    BNP Recent Labs  Lab 08/16/17 0925  BNP 655.0*     Patient Profile     72 y.o.femalewith a hx of nonobstructive CAD by prior LHC, chronic diastolic CHF, symptomatic bradycardia s/p PPM, frequent PVCs, chronic respiratory failure with hypoxia on home oxygen secondary to COPD, HTN, HLD, DM2, prior SAH in the setting of [place] for prior DVT now s/p filter placement, chronic right-sided atypical chest pain, lymphedema, stroke, OSA, and obesity, who was admitted 2/24 with dyspnea and AFib RVR.  EF now 30-35%.    Assessment & Plan    1 persistent atrial fibrillation-Pt had TEE DCCV to sinus rhythm previously but has developed recurrent atrial fibrillation this AM. Options are rate control and anticoagulation vs rhythm control. Not a candidate for flecanide given structural heart disease; would avoid amiodarone due to baseline lung disease; options then would be tikosyn or sotalol. Will supplement Mg  and recheck in AM. Check ECG for baseline QT interval. Add digoxin for CHF and rate control. Follow clinically. If stable, would plan rate control and anticoagulation. If again worsening symptoms with atrial fibrillation, would add tikosyn and repeat DCCV. Continue apixaban 5 mg twice daily.  Change metoprolol to toprol.   2 cardiomyopathy-etiology unclear.  Possibly secondary to atrial fibrillation with tachycardia.  We will continue beta-blocker (change lopressor to toprol). Continue Cozaar 25 mg daily.  She will need follow-up echocardiogram in 3 months at to see if LV function has improved.  If not would consider ischemia evaluation. Add spironolactone 12.5 mg daily.  3 acute systolic congestive heart failure-volume status improving; continue Lasix at present dose.  Follow renal function. Resume Kdur 20 meq daily.  4 hypertension-blood pressure is controlled.  Continue present medications.  5 COPD/interstitial lung disease-continue home oxygen.  Management per primary care.  6 prior pacemaker  7 Hypomagnesemia-supplement.  For questions or updates, please contact [place] Please consult [email or web address] for contact info under Cardiology/STEMI.      Signed, Kirk Ruths, MD  08/23/2017, 9:01 AM

## 2017-08-23 NOTE — Clinical Social Work Note (Signed)
Clinical Social Work Assessment  Patient Details  Name: Debra Saunders MRN: [id number] Date of Birth: 08/25/1945  Date of referral:  08/23/17               Reason for consult:  Facility Placement                Permission sought to share information with:  Chartered certified accountant granted to share information::  Yes, Verbal Permission Granted  Name::        Agency::  [facility] except for [facility]  Relationship::     Contact Information:  HUB  Housing/Transportation Living arrangements for the past 2 months:  [place] of Information:  Patient, Medical Team Patient Interpreter Needed:  None Criminal Activity/Legal Involvement Pertinent to Current Situation/Hospitalization:  No - Comment as needed Significant Relationships:  Adult Children, [place], [employer] Lives with:  Self Do you feel safe going back to the place where you live?  Yes Need for family participation in patient care:  No (Coment)  Care giving concerns:  PT recommendation for STR   Social Worker assessment / plan:  The CSW met with the patient at bedside to discuss discharge planning. The CSW assured the patient that no decisions had to be made today regarding placement as her daughters had advised the patient to speak as a family prior to making a specific decisions regarding which SNF. The patient stated that [facility] is the preference of herself and her daughters. The CSW explained the referral process, and the patient gave verbal permission to send the referral to all of the facilities [place].  At baseline, the patient lives alone and has chronic oxygen at 4L with CPAP at night. The CSW advised the patient that she would need to bring her CPAP to the SNF with her. The patient verbalized understanding. The plan is for discharge to SNF when stable. The CSW will provide the patient and her family bed offers  when they are available and follow to facilitate discharge.  Employment status:  Retired Nurse, adult PT Recommendations:  [place] / Referral to community resources:  [place]  Patient/Family's Response to care:  The patient was pleasant and thanked the CSW.  Patient/Family's Understanding of and Emotional Response to Diagnosis, Current Treatment, and Prognosis:  The patient is in agreement with SNF for discharge. Bed offers are pending.  Emotional Assessment Appearance:  Appears stated age Attitude/Demeanor/Rapport:  Engaged, Charismatic, Gracious Affect (typically observed):  Appropriate, Accepting, Pleasant Orientation:  Oriented to Self, Oriented to Place, Oriented to  Time, Oriented to Situation Alcohol / Substance use:  Never Used Psych involvement (Current and /or in the community):  No (Comment)  Discharge Needs  Concerns to be addressed:  Care Coordination, Discharge Planning Concerns Readmission within the last 30 days:  No Current discharge risk:  Lives alone, Chronically ill Barriers to Discharge:  Continued Medical Work up   [employer], LCSW 08/23/2017, 1:57 PM

## 2017-08-23 NOTE — NC FL2 (Signed)
[place] LEVEL OF CARE SCREENING TOOL     IDENTIFICATION  Patient Name: Debra Saunders Birthdate: 1946/01/24 Sex: female Admission Date (Current Location): 08/16/2017  [place] and [place] Number:  Engineering geologist and Address:  [facility], [address], [place], [place] [address]      Provider Number: [id number]  Attending Physician Name and Address:  Epifanio Lesches, MD  Relative Name and Phone Number:  Rockne Menghini (Daughter) [phone] and Heloise Purpura (Daughter) [phone]    Current Level of Care: Hospital Recommended Level of Care: [place] Prior Approval Number:    Date Approved/Denied:   PASRR Number: [id number]  Discharge Plan: SNF    Current Diagnoses: Patient Active Problem List   Diagnosis Date Noted  . Acute respiratory failure with hypoxia and hypercapnia ([place]) 08/16/2017  . Acute bilateral low back pain without sciatica 01/28/2017  . Irregular heart beat 07/23/2016  . Pain in limb 07/22/2016  . Neuropathy 06/09/2016  . Pain in the chest 03/10/2016  . UTI (urinary tract infection) 03/03/2016  . Chronic deep vein thrombosis (DVT) (HCC) 03/03/2016  . Chronic back pain 10/19/2015  . Goiter 04/25/2015  . Cervical disc disorder with radiculopathy of cervical region 03/30/2015  . Elevated LFTs 06/29/2014  . 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 Abnormality of gait 01/13/2014  . Sinoatrial node dysfunction ([place]) 01/09/2014  . Allergic rhinitis 10/26/2013  . Lymphedema 08/03/2013  . Constipation 04/12/2013  . Pulmonary embolus ([place]) 01/19/2013  . Subarachnoid hemorrhage ([place]) 01/19/2013  . Musculoskeletal chest pain 01/19/2013  . Preop cardiovascular exam 10/19/2012  . Anemia 04/28/2012  . Chronic respiratory failure with hypoxia ([place]) 04/26/2012  . Diabetes ([place]) 03/30/2012  . Pulmonary hypertension ([place]) 03/30/2012  . Hyperlipidemia 03/30/2012  . Screening for breast  cancer 03/30/2012  . Hypothyroidism 03/30/2012  . Chronic diastolic heart failure ([place]) 01/22/2012  . Hypertension     Orientation RESPIRATION BLADDER Height & Weight     Self, Time, Situation, Place  O2(Chronic o2 4L ) Continent Weight: 223 lb 1.6 oz (101.2 kg) Height:  5\' 1"  (154.9 cm)  BEHAVIORAL SYMPTOMS/MOOD NEUROLOGICAL BOWEL NUTRITION STATUS      Continent Diet(Carb modified)  AMBULATORY STATUS COMMUNICATION OF NEEDS Skin   Extensive Assist   Normal                       Personal Care Assistance Level of Assistance  Bathing, Feeding, Dressing Bathing Assistance: Limited assistance Feeding assistance: Independent Dressing Assistance: Limited assistance     Functional Limitations Info             SPECIAL CARE FACTORS FREQUENCY  PT (By licensed PT)     PT Frequency: Up to 5X per week              Contractures Contractures Info: Not present    Additional Factors Info  Code Status, Allergies Code Status Info: Full Allergies Info: Aspirin, Other, Penicillins           Current Medications (08/23/2017):  This is the current hospital active medication list Current Facility-Administered Medications  Medication Dose Route Frequency Provider Last Rate Last Dose  . 0.9 %  sodium chloride infusion  250 mL Intravenous PRN Salary, Montell D, MD      . acetaminophen (TYLENOL) tablet 650 mg  650 mg Oral Q4H PRN Loney Hering D, MD   650 mg at 08/22/17 0336  . albuterol (PROVENTIL) (2.5 MG/3ML) 0.083% nebulizer  solution 2.5 mg  2.5 mg Nebulization Q6H PRN Salary, Montell D, MD      . apixaban (ELIQUIS) tablet 5 mg  5 mg Oral BID Gouru, Aruna, MD   5 mg at 08/23/17 1011  . calcium carbonate (TUMS - dosed in mg elemental calcium) chewable tablet 200 mg of elemental calcium  1 tablet Oral BID Epifanio Lesches, MD   200 mg of elemental calcium at 08/23/17 1011  . digoxin (LANOXIN) tablet 0.125 mg  0.125 mg Oral Daily Lelon Perla, MD   0.125 mg at 08/23/17  1011  . furosemide (LASIX) injection 20 mg  20 mg Intravenous BID Christell Faith M, PA-C   20 mg at 08/23/17 2952  . gabapentin (NEURONTIN) capsule 200 mg  200 mg Oral TID Loney Hering D, MD   200 mg at 08/23/17 1011  . insulin aspart (novoLOG) injection 0-20 Units  0-20 Units Subcutaneous TID WC Salary, [place] Bodily D, MD   4 Units at 08/23/17 1223  . insulin aspart (novoLOG) injection 0-5 Units  0-5 Units Subcutaneous QHS Loney Hering D, MD   2 Units at 08/16/17 2148  . losartan (COZAAR) tablet 25 mg  25 mg Oral Daily Lelon Perla, MD   25 mg at 08/23/17 1011  . metoprolol succinate (TOPROL-XL) 24 hr tablet 200 mg  200 mg Oral Daily Lelon Perla, MD   200 mg at 08/23/17 1010  . nitroGLYCERIN (NITROSTAT) SL tablet 0.4 mg  0.4 mg Sublingual Q5 min PRN Salary, Montell D, MD      . ondansetron (ZOFRAN) injection 4 mg  4 mg Intravenous Q6H PRN Salary, Montell D, MD   4 mg at 08/20/17 0601  . oxyCODONE-acetaminophen (PERCOCET/ROXICET) 5-325 MG per tablet 1 tablet  1 tablet Oral Q8H PRN Salary, Avel Peace, MD   1 tablet at 08/23/17 0257  . pantoprazole (PROTONIX) EC tablet 40 mg  40 mg Oral Daily Epifanio Lesches, MD   40 mg at 08/23/17 1011  . polyethylene glycol (MIRALAX / GLYCOLAX) packet 17 g  17 g Oral Daily PRN Loney Hering D, MD   17 g at 08/18/17 0811  . potassium chloride SA (K-DUR,KLOR-CON) CR tablet 20 mEq  20 mEq Oral Daily Lelon Perla, MD   20 mEq at 08/23/17 1011  . promethazine (PHENERGAN) tablet 25 mg  25 mg Oral Q4H PRN Salary, [place] Bodily D, MD   25 mg at 08/20/17 0751  . rosuvastatin (CRESTOR) tablet 10 mg  10 mg Oral Daily Salary, Montell D, MD   10 mg at 08/23/17 1011  . senna-docusate (Senokot-S) tablet 1 tablet  1 tablet Oral BID Flora Lipps, MD   1 tablet at 08/23/17 1010  . sildenafil (REVATIO) tablet 20 mg  20 mg Oral TID Loney Hering D, MD   20 mg at 08/23/17 1017  . sodium chloride flush (NS) 0.9 % injection 3 mL  3 mL Intravenous Q12H Epifanio Lesches,  MD   3 mL at 08/23/17 1018  . spironolactone (ALDACTONE) tablet 12.5 mg  12.5 mg Oral Daily Lelon Perla, MD   12.5 mg at 08/23/17 1011     Discharge Medications: Please see discharge summary for a list of discharge medications.  Relevant Imaging Results:  Relevant Lab Results:   Additional Information SS# [id number]  Zettie Pho, LCSW

## 2017-08-23 NOTE — Progress Notes (Signed)
Notified Dr. Estanislado Pandy via text page of Mg of 1.6. Will continue to [employer]

## 2017-08-23 NOTE — Plan of Care (Signed)
  Clinical Measurements: Ability to maintain clinical measurements within normal limits will improve 08/23/2017 0001 - Progressing by Marylouise Stacks, RN

## 2017-08-24 LAB — URINALYSIS, COMPLETE (UACMP) WITH MICROSCOPIC
Bilirubin Urine: NEGATIVE
Glucose, UA: NEGATIVE mg/dL
Ketones, ur: NEGATIVE mg/dL
Nitrite: POSITIVE — AB
Protein, ur: NEGATIVE mg/dL
RBC / HPF: NONE SEEN RBC/hpf (ref 0–5)
Specific Gravity, Urine: 1.021 (ref 1.005–1.030)
Squamous Epithelial / LPF: NONE SEEN
pH: 5 (ref 5.0–8.0)

## 2017-08-24 LAB — BASIC METABOLIC PANEL
Anion gap: 10 (ref 5–15)
BUN: 17 mg/dL (ref 6–20)
CO2: 30 mmol/L (ref 22–32)
Calcium: 8.8 mg/dL — ABNORMAL LOW (ref 8.9–10.3)
Chloride: 101 mmol/L (ref 101–111)
Creatinine, Ser: 0.74 mg/dL (ref 0.44–1.00)
GFR calc Af Amer: >60 mL/min (ref >60)
GFR calc non Af Amer: >60 mL/min (ref >60)
Glucose, Bld: 127 mg/dL — ABNORMAL HIGH (ref 65–99)
Potassium: 3.9 mmol/L (ref 3.5–5.1)
Sodium: 141 mmol/L (ref 135–145)

## 2017-08-24 LAB — GLUCOSE, CAPILLARY
Glucose-Capillary: 108 mg/dL — ABNORMAL HIGH (ref 65–99)
Glucose-Capillary: 194 mg/dL — ABNORMAL HIGH (ref 65–99)
Glucose-Capillary: 107 mg/dL — ABNORMAL HIGH (ref 65–99)
Glucose-Capillary: 158 mg/dL — ABNORMAL HIGH (ref 65–99)

## 2017-08-24 LAB — MAGNESIUM: Magnesium: 1.5 mg/dL — ABNORMAL LOW (ref 1.7–2.4)

## 2017-08-24 MED ORDER — LEVOFLOXACIN IN D5W 250 MG/50ML IV SOLN
250.0000 mg | INTRAVENOUS | Status: DC
  Administered 2017-08-24: 250 mg via INTRAVENOUS
  Filled 2017-08-24 (×2): qty 50

## 2017-08-24 MED ORDER — SODIUM CHLORIDE 0.9 % IV SOLN: 1.0000 g | INTRAVENOUS | Status: DC

## 2017-08-24 MED ORDER — ACETAMINOPHEN 325 MG PO TABS
650.0000 mg | ORAL_TABLET | ORAL | Status: DC | PRN
  Administered 2017-08-24 – 2017-08-27 (×5): 650 mg via ORAL
  Filled 2017-08-24 (×5): qty 2

## 2017-08-24 MED ORDER — PHENOL 1.4 % MT LIQD
1.0000 | OROMUCOSAL | Status: DC | PRN
  Administered 2017-08-24: 1 via OROMUCOSAL
  Filled 2017-08-24: qty 177

## 2017-08-24 MED ORDER — LEVOFLOXACIN IN D5W 250 MG/50ML IV SOLN
250.0000 mg | INTRAVENOUS | Status: DC
  Filled 2017-08-24: qty 50

## 2017-08-24 MED ORDER — MENTHOL 3 MG MT LOZG
1.0000 | LOZENGE | OROMUCOSAL | Status: DC | PRN
  Administered 2017-08-24: 3 mg via ORAL
  Filled 2017-08-24: qty 9

## 2017-08-24 MED ORDER — HYDROCOD POLST-CPM POLST ER 10-8 MG/5ML PO SUER
5.0000 mL | Freq: Two times a day (BID) | ORAL | Status: DC | PRN
  Administered 2017-08-24 – 2017-08-25 (×2): 5 mL via ORAL
  Filled 2017-08-24 (×2): qty 5

## 2017-08-24 MED ORDER — MAGNESIUM SULFATE 2 GM/50ML IV SOLN
2.0000 g | Freq: Once | INTRAVENOUS | Status: AC
  Administered 2017-08-24: 2 g via INTRAVENOUS
  Filled 2017-08-24: qty 50

## 2017-08-24 MED ORDER — TORSEMIDE 20 MG PO TABS
20.0000 mg | ORAL_TABLET | Freq: Every day | ORAL | Status: DC
  Administered 2017-08-24 – 2017-08-26 (×3): 20 mg via ORAL
  Filled 2017-08-24 (×3): qty 1

## 2017-08-24 NOTE — Progress Notes (Signed)
Vianne Bulls gave verbal order for rocephin IV.  Patient allergic to pencillins.  Called pharmacy to get a new order.  Corene Cornea suggested 250mg  of levaquin IV to replace the rocephin order.

## 2017-08-24 NOTE — Clinical Social Work Note (Signed)
CSW spoke with patient's daughter [name], [phone] and presented bed offers, she chose [facility] [place], [facility] spoke with [facility] and [facility] can [facility] patient once insurance has been approved, and patient is medically ready for disharge.  CSW to continue to coordinate discharge planning between SNF and hospital.  Jones Broom. [place], MSW, [place]  08/24/2017 5:40 PM

## 2017-08-24 NOTE — Progress Notes (Signed)
Notified Dr. Marcille Blanco via text page of mg level of 1.5.

## 2017-08-24 NOTE — Progress Notes (Signed)
Patient has low grade fever, also ua was positive for uti.  Paged Vianne Bulls since patient is not on any antibiotics at this time.

## 2017-08-24 NOTE — Progress Notes (Addendum)
Patient ID: Debra Saunders, female   DOB: 02-Feb-1946, 72 y.o.   MRN: [id number]  [place] PROGRESS NOTE  TYNLEIGH BIRT XBD:[id number] DOB: [birth year] DOA: 08/16/2017 PCP: [name], MD  HPI/Subjective:has cough and dark urine.was in afib last night now converted to sinus rhythm. PObjective: Vitals:   08/24/17 0427 08/24/17 0441  BP: 123/71   Pulse: (!) 152 87  Resp: 16   Temp: 98 F (36.7 C)   SpO2: 97%     Filed Weights   08/22/17 0327 08/23/17 0426 08/24/17 0427  Weight: 103.1 kg (227 lb 6.4 oz) 101.2 kg (223 lb 1.6 oz) 100.4 kg (221 lb 4.8 oz)    ROS: Review of Systems  Constitutional: Negative for chills.  HENT: Negative for sore throat.   Eyes: Negative for blurred vision.  Respiratory: Negative for cough and shortness of breath.   Cardiovascular: Negative for chest pain.  Gastrointestinal: Negative for abdominal pain, constipation, diarrhea, nausea and vomiting.  Genitourinary: Negative for dysuria.  Musculoskeletal: Negative for joint pain.  Neurological: Negative for dizziness and headaches.   Exam: Physical Exam  Constitutional: She is oriented to person, place, and time.  HENT:  Nose: No mucosal edema.  Mouth/Throat: No oropharyngeal exudate or posterior oropharyngeal edema.  Eyes: Conjunctivae, EOM and lids are normal. Pupils are equal, round, and reactive to light.  Neck: No JVD present. Carotid bruit is not present. No edema present. No thyroid mass and no thyromegaly present.  Cardiovascular: Regular rhythm, S1 normal and S2 normal. Exam reveals no gallop.  No murmur heard. Pulses:      Dorsalis pedis pulses are 2+ on the right side, and 2+ on the left side.  Respiratory: No respiratory distress. She has no decreased breath sounds. She has no wheezes. She has no rhonchi. She has no rales.  GI: Soft. Bowel sounds are normal. There is no tenderness.  Musculoskeletal:       Right ankle: She exhibits swelling.       Left ankle: She  exhibits swelling.  Lymphadenopathy:    She has no cervical adenopathy.  Neurological: She is alert and oriented to person, place, and time. No cranial nerve deficit.  Skin: Skin is warm. No rash noted. Nails show no clubbing.  Psychiatric: She has a normal mood and affect.      Data Reviewed: Basic Metabolic Panel: Recent Labs  Lab 08/18/17 0418 08/18/17 1703 08/19/17 8341 08/22/17 0420 08/23/17 0530 08/24/17 0450  NA 135 132* 135 138 139 141  K 4.0 4.1 4.2 5.0 4.0 3.9  CL 100* 98* 100* 104 101 101  CO2 26 24 27 27 28 30   GLUCOSE 173* 133* 118* 126* 128* 127*  BUN 51* 52* 45* 27* 20 17  CREATININE 1.15* 1.06* 0.91 0.79 0.65 0.74  CALCIUM 8.4* 8.7* 8.4* 8.8* 8.9 8.8*  MG 2.7*  --   --   --  1.6* 1.5*   Liver Function Tests: No results for input(s): AST, ALT, ALKPHOS, BILITOT, PROT, ALBUMIN in the last 168 hours. No results for input(s): LIPASE, AMYLASE in the last 168 hours. No results for input(s): AMMONIA in the last 168 hours. CBC: Recent Labs  Lab 08/22/17 0420  WBC 5.3  HGB 10.4*  HCT 33.1*  MCV 83.7  PLT 187   Cardiac Enzymes: No results for input(s): CKTOTAL, CKMB, CKMBINDEX, TROPONINI in the last 168 hours. BNP (last 3 results) Recent Labs    08/04/17 1552 08/16/17 0925  BNP 175.0* 655.0*  ProBNP (last 3 results) No results for input(s): PROBNP in the last 8760 hours.  CBG: Recent Labs  Lab 08/23/17 1203 08/23/17 1625 08/23/17 2023 08/24/17 0741 08/24/17 1117  GLUCAP 178* 115* 149* 108* 194*    Recent Results (from the past 240 hour(s))  MRSA PCR Screening     Status: Abnormal   Collection Time: 08/16/17  1:39 PM  Result Value Ref Range Status   MRSA by PCR POSITIVE (A) NEGATIVE Final    Comment:        [name] (FDA approved for NASAL specimens only), is one component of a comprehensive MRSA colonization surveillance program. It is not intended to diagnose MRSA infection nor to guide or monitor treatment  for MRSA infections. RESULT CALLED TO, READ BACK BY AND VERIFIED WITH: BRITTNEY RUDD @ 7124 ON 08/16/2017 BY CAF Performed at [facility], [address] [place], [place] [address]   Culture, blood (routine x 2)     Status: None   Collection Time: 08/16/17  4:49 PM  Result Value Ref Range Status   Specimen Description BLOOD LEFT ANTECUBITAL  Final   Special Requests   Final    BOTTLES DRAWN AEROBIC AND ANAEROBIC Blood Culture results may not be optimal due to an inadequate volume of blood received in culture bottles   Culture   Final    NO GROWTH 5 DAYS Performed at [facility], [address] [place], [place] [address]    Report Status 08/21/2017 FINAL  Final  Culture, blood (routine x 2)     Status: None   Collection Time: 08/16/17  4:49 PM  Result Value Ref Range Status   Specimen Description BLOOD LEFT ANTECUBITAL  Final   Special Requests   Final    BOTTLES DRAWN AEROBIC AND ANAEROBIC Blood Culture results may not be optimal due to an inadequate volume of blood received in culture bottles   Culture   Final    NO GROWTH 5 DAYS Performed at [facility], [address] [place], [place] [address]    Report Status 08/21/2017 FINAL  Final      Scheduled Meds: . apixaban  5 mg Oral BID  . calcium carbonate  1 tablet Oral BID  . digoxin  0.125 mg Oral Daily  . gabapentin  200 mg Oral TID  . insulin aspart  0-20 Units Subcutaneous TID WC  . insulin aspart  0-5 Units Subcutaneous QHS  . losartan  25 mg Oral Daily  . metoprolol succinate  200 mg Oral Daily  . pantoprazole  40 mg Oral Daily  . potassium chloride  20 mEq Oral Daily  . rosuvastatin  10 mg Oral Daily  . senna-docusate  1 tablet Oral BID  . sildenafil  20 mg Oral TID  . sodium chloride flush  3 mL Intravenous Q12H  . spironolactone  12.5 mg Oral Daily  . torsemide  20 mg Oral Daily   Continuous Infusions: . sodium chloride      Assessment/Plan:  1. Acute on chronic  hypoxic hypercapnic respiratory failure.  Patient required BiPAP on presentation and now back to usual 4 L nasal cannula, patient has CPAP at night, chronic oxygen at home Atrial fibrillation with rapid ventricular response.  Patient is/pcardioversion and now in normal sinus rhythm.  In and out of atrial fibrillation, cardiology is watching closely to see if she needs to be on Tikosyn.  Patient on Eliquis for anticoagulation Appreciate cardiology following.  Regarding her cardiomyopathy  patient is to be on beta-blockers, Cozaar, patient needs echo in 3 months for LV function evaluation and follow-up.  2. Chest pain ldue to atrial fibrillation.  Seen by cardiology.  Hyperlipidemia unspecified on Crestor 3. Chronic diastolic congestive heart failure.  Started on torsemide.  She takes torsemide at home..  Mild hyperkalemia so we discontinued potassium supplements, monitor kidney function closely.  4. History of type 2 diabetes mellitus.   at this point since hemoglobin A1c is actually low. 5. Morbid obesity weight loss needed 6. Generalized weakness: Physical therapy,recommended  skilled nursing.. Patient [facility] to go to [facility]. #8 mild abdominal pain likely gastritis: Patient can take Tums.,  AddedPPI.  Continue MiraLAX for constipation. COPD, chronic interstitial lung disease: On chronic oxygen at home.  No wheezing. #10 UTI: Added Levaquin.  Follow urine cultures. Code Status:     Code Status Orders  (From admission, onward)        Start     Ordered   08/16/17 1614  Full code  Continuous     08/16/17 1613    Code Status History    Date Active Date Inactive Code Status Order ID Comments User Context   03/30/2015 21:52 04/03/2015 21:22 Full Code [id number]  Etta Quill, DO Inpatient   03/27/2015 22:08 03/28/2015 17:37 Full Code [id number]  Nicholes Mango, MD ED   01/09/2014 14:18 01/10/2014 18:03 Full Code [id number]  Deboraha Sprang, MD Inpatient   01/16/2013 23:35 01/27/2013 20:11 Full  Code [id number]  Collene Gobble, MD Inpatient     Family Communication: Family at bedside Disposition Plan: To be determined  Consultants:  Critical care specialist  Cardiology  Time spent: 28 minutes  [place]

## 2017-08-24 NOTE — Progress Notes (Addendum)
Progress Note  Patient Name: Debra Saunders Date of Encounter: 08/24/2017  Primary Cardiologist: Fletcher Anon  Subjective   SOB improved. At baseline 4 L via nasal cannula. No chest pain. Minus 9.6 L for the admission and 2.8 L for the past 24 hours. Weight 223-->221 pounds. Renal function normal.   Inpatient Medications    Scheduled Meds: . apixaban  5 mg Oral BID  . calcium carbonate  1 tablet Oral BID  . digoxin  0.125 mg Oral Daily  . furosemide  20 mg Intravenous BID  . gabapentin  200 mg Oral TID  . insulin aspart  0-20 Units Subcutaneous TID WC  . insulin aspart  0-5 Units Subcutaneous QHS  . losartan  25 mg Oral Daily  . metoprolol succinate  200 mg Oral Daily  . pantoprazole  40 mg Oral Daily  . potassium chloride  20 mEq Oral Daily  . rosuvastatin  10 mg Oral Daily  . senna-docusate  1 tablet Oral BID  . sildenafil  20 mg Oral TID  . sodium chloride flush  3 mL Intravenous Q12H  . spironolactone  12.5 mg Oral Daily   Continuous Infusions: . sodium chloride    . magnesium sulfate 1 - 4 g bolus IVPB     PRN Meds: sodium chloride, acetaminophen, albuterol, nitroGLYCERIN, ondansetron (ZOFRAN) IV, oxyCODONE-acetaminophen, polyethylene glycol, promethazine   Vital Signs    Vitals:   08/23/17 1542 08/23/17 1936 08/24/17 0427 08/24/17 0441  BP: 120/69 140/78 123/71   Pulse: 70 73 (!) 152 87  Resp: 18 18 16    Temp: 97.7 F (36.5 C) 98 F (36.7 C) 98 F (36.7 C)   TempSrc: Oral Oral Oral   SpO2: 100% 96% 97%   Weight:   221 lb 4.8 oz (100.4 kg)   Height:        Intake/Output Summary (Last 24 hours) at 08/24/2017 0937 Last data filed at 08/24/2017 0429 Gross per 24 hour  Intake -  Output 2500 ml  Net -2500 ml   Filed Weights   08/22/17 0327 08/23/17 0426 08/24/17 0427  Weight: 227 lb 6.4 oz (103.1 kg) 223 lb 1.6 oz (101.2 kg) 221 lb 4.8 oz (100.4 kg)    Telemetry    Afib/flutter, converted to NSR at 8:08 AM 3/4 with paced rhythm - Personally  Reviewed  ECG    n/a - Personally Reviewed  Physical Exam   GEN: No acute distress.   Neck: No JVD. Cardiac: RRR, no murmurs, rubs, or gallops.  Respiratory: Mildly diminished breath sounds bilaterally.  GI: Soft, nontender, non-distended.   MS: No edema; No deformity. Neuro:  Alert and oriented x 3; Nonfocal.  Psych: Normal affect.  Labs    Chemistry Recent Labs  Lab 08/22/17 0420 08/23/17 0530 08/24/17 0450  NA 138 139 141  K 5.0 4.0 3.9  CL 104 101 101  CO2 27 28 30   GLUCOSE 126* 128* 127*  BUN 27* 20 17  CREATININE 0.79 0.65 0.74  CALCIUM 8.8* 8.9 8.8*  GFRNONAA >60 >60 >60  GFRAA >60 >60 >60  ANIONGAP 7 10 10      Hematology Recent Labs  Lab 08/22/17 0420  WBC 5.3  RBC 3.95  HGB 10.4*  HCT 33.1*  MCV 83.7  MCH 26.2  MCHC 31.3*  RDW 14.4  PLT 187    Cardiac EnzymesNo results for input(s): TROPONINI in the last 168 hours. No results for input(s): TROPIPOC in the last 168 hours.   BNPNo results  for input(s): BNP, PROBNP in the last 168 hours.   DDimer No results for input(s): DDIMER in the last 168 hours.   Radiology    No results found.  Cardiac Studies   TTE 08/17/2017: Study Conclusions  - Left ventricle: The cavity size was mildly dilated. Systolic   function was moderately to severely reduced. The estimated   ejection fraction was in the range of 30% to 35%. Diffuse   hypokinesis. - Aortic valve: There was trivial regurgitation. Valve area (VTI):   2.25 cm^2. Valve area (Vmax): 1.67 cm^2. Valve area (Vmean): 1.92   cm^2. - Mitral valve: There was severe regurgitation. - Left atrium: The atrium was moderately dilated. - Right atrium: The atrium was mildly dilated. - Tricuspid valve: There was moderate regurgitation.  TEE 08/20/2017: See report in EPIC  for complete details: In brief, transgastric imaging revealed normal LV function with no RWMAs and no mural apical thrombus. No left atrial appendage thrombus.   Estimated ejection  fraction was 35%.  Right sided cardiac chambers were normal with no evidence of pulmonary hypertension.  Imaging of the septum showed no ASD or VSD Bubble study was negative for shunt 2D and color flow confirmed no PFO  The LA was well visualized in orthogonal views.  There was no spontaneous contrast and no thrombus in the LA and LA appendage   The descending thoracic aorta and arch had moderate atherosclerosis with no evidence of aneurysmal dilation or disection  Patient Profile     72 y.o. female with history of nonobstructive CAD by prior LHC, chronic diastolic CHF, symptomatic bradycardia s/p PPM, frequent PVCs, chronic respiratory failure with hypoxia on home oxygen secondary to COPD, HTN, HLD, DM2, prior SAH in the setting of [place] for prior DVT now s/p filter placement, chronic right-sided atypical chest pain, lymphedema, stroke, OSA, and obesity, who was admitted 2/24 with dyspnea and AFib RVR. EF now 30-35%.  Assessment & Plan    1. Persistent Afib with RVR: -Status post TEE/DCCV 8/93 complicated by respiratory distress requiring Narcan, flumazenil, and BiPAP -DCCV was briefly successful, though she has redeveloped Afib as of 3/3 -Spontaneously converted back to sinus rhythm at 8:08 AM on 3/4 -Rate control with Toprol XL 200 mg daily -Eliquis 5 mg bid -CHADS2VASc at least 8 (CHF, HTN, age x 1, DM, stroke x 2, vascular disease, female) -If she redevelops Afib, consider AAT such as Tikosyn, if EKG and lytes are at goal for possible initiation   2. Acute systolic CHF/pulmonary HTN/acute on chronic diastolic CHF: -Possibly in the setting of her Afib with RVR -Continue digoxin  -Check digoxin level 3/5 -Transition from IV Lasix to PO torsemide 20 mg daily -Toprol XL, losartan, spironolactone  -She will need follow up echo in ~ 3 months to evaluate for improvement in EF, if EF remains reduced she will need ischemic evaluation -CHF education -Daily weights, strict Is and  Os  3. HTN: -Well controlled -Continue current medications as detailed above  4. COPD/ILD: -Continue home oxygen -Per IM  5. Hypomagnesemia: -Replete to goal > 2.0  6. Sinus node dysfunction: -Status post prior PPM    For questions or updates, please contact [place] Please consult [email or web address] for contact info under Cardiology/STEMI.    Signed, Christell Faith, PA-C [place] Pager: [phone] 08/24/2017, 9:37 AM

## 2017-08-25 ENCOUNTER — Encounter: Payer: Medicare HMO | Admitting: Internal Medicine

## 2017-08-25 ENCOUNTER — Inpatient Hospital Stay: Payer: Medicare HMO

## 2017-08-25 LAB — GLUCOSE, CAPILLARY
Glucose-Capillary: 135 mg/dL — ABNORMAL HIGH (ref 65–99)
Glucose-Capillary: 180 mg/dL — ABNORMAL HIGH (ref 65–99)
Glucose-Capillary: 144 mg/dL — ABNORMAL HIGH (ref 65–99)
Glucose-Capillary: 186 mg/dL — ABNORMAL HIGH (ref 65–99)

## 2017-08-25 LAB — MAGNESIUM: Magnesium: 1.7 mg/dL (ref 1.7–2.4)

## 2017-08-25 LAB — CBC
HCT: 33.5 % — ABNORMAL LOW (ref 35.0–47.0)
Hemoglobin: 10.5 g/dL — ABNORMAL LOW (ref 12.0–16.0)
MCH: 26.4 pg (ref 26.0–34.0)
MCHC: 31.4 g/dL — ABNORMAL LOW (ref 32.0–36.0)
MCV: 84.2 fL (ref 80.0–100.0)
Platelets: 205 10*3/uL (ref 150–440)
RBC: 3.97 MIL/uL (ref 3.80–5.20)
RDW: 15.1 % — ABNORMAL HIGH (ref 11.5–14.5)
WBC: 10.3 10*3/uL (ref 3.6–11.0)

## 2017-08-25 MED ORDER — ALBUTEROL SULFATE (2.5 MG/3ML) 0.083% IN NEBU
2.5000 mg | INHALATION_SOLUTION | Freq: Four times a day (QID) | RESPIRATORY_TRACT | Status: DC
  Administered 2017-08-25 – 2017-08-28 (×10): 2.5 mg via RESPIRATORY_TRACT
  Filled 2017-08-25 (×11): qty 3

## 2017-08-25 MED ORDER — PREMIER PROTEIN SHAKE
11.0000 [oz_av] | Freq: Two times a day (BID) | ORAL | Status: DC
  Administered 2017-08-26 – 2017-08-28 (×2): 11 [oz_av] via ORAL

## 2017-08-25 MED ORDER — METHYLPREDNISOLONE SODIUM SUCC 125 MG IJ SOLR
60.0000 mg | INTRAMUSCULAR | Status: DC
  Administered 2017-08-25: 60 mg via INTRAVENOUS
  Filled 2017-08-25: qty 2

## 2017-08-25 MED ORDER — LEVOFLOXACIN 500 MG PO TABS
250.0000 mg | ORAL_TABLET | Freq: Every day | ORAL | Status: DC
  Administered 2017-08-25 – 2017-08-26 (×2): 250 mg via ORAL
  Filled 2017-08-25 (×2): qty 1

## 2017-08-25 NOTE — Care Management (Signed)
Patient to discharge to [facility].

## 2017-08-25 NOTE — Progress Notes (Signed)
Pt BP was 107/32 MAP of 50. Docotr was notified and ordered to not give scheduled Sildenafil 20 mg tablet. Will continue to monitor.

## 2017-08-25 NOTE — Progress Notes (Addendum)
Physical Therapy Treatment Patient Details Name: Debra Saunders MRN: [id number] DOB: August 05, 1945 Today's Date: 08/25/2017    History of Present Illness 72 y/o female with ~week of shortness of breath and chest pain.  She was very tachcardic, 100-120bpm t/o most of PT exam. Pt uses 4 liters O2 at home 24/7.      PT Comments    Pt ready for session.  Stated she needs to be changed.  Rolling left and right with ease and use of rail.  Pt does have a stage 2 ulcer on her backside.  Cream applied.  Pt stated she has had it for some time prior to admission.  Stated she has not been calling for assistance for self care because she does not want to bother her family or staff here.  Education given on importance of self care for wound healing and prevention.  She voiced understanding.  Discussed with primary nurse tech to make her aware that she is not voicing her needs because of her perception of her bothering staff.  Barrier cream applied.  She was able to stand with min guard/assist as she needed light contact cues to stand.  She was able to walk slowly to recliner at bedside but gait was limited today due to fatigue.  She did however complete 2 more standing trials at chair with marching in place with very low step height for up to 1 minute 40 seconds.  Pt reported feeling increased weakness today.  Reviewed importance again regarding calling for staff assistance and asking to get out of bed daily. Sats remained mid 90's during session with O2 support.  SNF remains appropriate discharge plan to increase functional independence and mobility.   Follow Up Recommendations  SNF     Equipment Recommendations  None recommended by PT    Recommendations for Other Services       Precautions / Restrictions Precautions Precautions: Fall Restrictions Weight Bearing Restrictions: No    Mobility  Bed Mobility Overal bed mobility: Needs Assistance Bed Mobility: Supine to Sit;Rolling Rolling: Modified  independent (Device/Increase time)   Supine to sit: Min guard;Min assist     General bed mobility comments: some assist to get hips to edge of bed.  Transfers Overall transfer level: Needs assistance Equipment used: Rolling walker (2 wheeled) Transfers: Sit to/from Stand Sit to Stand: Min guard            Ambulation/Gait Ambulation/Gait assistance: Min guard Ambulation Distance (Feet): 5 Feet Assistive device: Rolling walker (2 wheeled) Gait Pattern/deviations: Step-through pattern;Decreased step length - right;Decreased step length - left         Stairs            Wheelchair Mobility    Modified Rankin (Stroke Patients Only)       Balance Overall balance assessment: Needs assistance Sitting-balance support: Bilateral upper extremity supported Sitting balance-Leahy Scale: Good     Standing balance support: Bilateral upper extremity supported Standing balance-Leahy Scale: Poor Standing balance comment: Uses RW for balance and requires close CGA                            Cognition Arousal/Alertness: Awake/alert Behavior During Therapy: WFL for tasks assessed/performed Overall Cognitive Status: Within Functional Limits for tasks assessed  Exercises Other Exercises Other Exercises: stood x 3 with walker up to 1 minute 40 seconds.  marching in place with very low step height and faituge.    General Comments        Pertinent Vitals/Pain Pain Assessment: No/denies pain    Home Living                      Prior Function            PT Goals (current goals can now be found in the care plan section) Progress towards PT goals: Progressing toward goals    Frequency    Min 2X/week      PT Plan Current plan remains appropriate    Co-evaluation              AM-PAC PT "6 Clicks" Daily Activity  Outcome Measure  Difficulty turning over in bed (including  adjusting bedclothes, sheets and blankets)?: A Little Difficulty moving from lying on back to sitting on the side of the bed? : Unable Difficulty sitting down on and standing up from a chair with arms (e.g., wheelchair, bedside commode, etc,.)?: Unable Help needed moving to and from a bed to chair (including a wheelchair)?: A Little Help needed walking in hospital room?: A Little Help needed climbing 3-5 steps with a railing? : A Little 6 Click Score: 2    End of Session Equipment Utilized During Treatment: Gait belt;Oxygen Activity Tolerance: Patient limited by fatigue Patient left: in chair;with chair alarm set;with call bell/phone within reach   Pain - Right/Left: Left Pain - part of body: Leg     Time: 0928-0959 PT Time Calculation (min) (ACUTE ONLY): 31 min  Charges:  $Gait Training: 8-22 mins $Therapeutic Exercise: 8-22 mins                    G Codes:       Chesley Noon, PTA 08/25/17, 10:48 AM

## 2017-08-25 NOTE — Progress Notes (Signed)
Patient ID: Debra Saunders, female   DOB: Jun 07, 1946, 72 y.o.   MRN: [id number]  [place] PROGRESS NOTE  Debra Saunders YWV:[id number] DOB: Sep 30, 1945 DOA: 08/16/2017 PCP: [name], MD  HPI/Subjective having lots of cough, shortness of breath and wheezing.  Did not sleep last night due to cough. PObjective: Vitals:   08/25/17 0346 08/25/17 0910  BP: 134/62 (!) 144/70  Pulse: 68 74  Resp: 18   Temp: 98 F (36.7 C)   SpO2: 92% 99%    Filed Weights   08/23/17 0426 08/24/17 0427 08/25/17 0346  Weight: 101.2 kg (223 lb 1.6 oz) 100.4 kg (221 lb 4.8 oz) 100.6 kg (221 lb 11.2 oz)    ROS: Review of Systems  Constitutional: Positive for fever. Negative for chills.  HENT: Negative for hearing loss and sore throat.   Eyes: Negative for blurred vision, double vision and photophobia.  Respiratory: Positive for cough, shortness of breath and wheezing. Negative for hemoptysis.   Cardiovascular: Negative for chest pain, palpitations, orthopnea and leg swelling.  Gastrointestinal: Negative for abdominal pain, constipation, diarrhea, nausea and vomiting.  Genitourinary: Negative for dysuria and urgency.  Musculoskeletal: Negative for joint pain, myalgias and neck pain.  Skin: Negative for rash.  Neurological: Negative for dizziness, focal weakness, seizures, weakness and headaches.  Psychiatric/Behavioral: Negative for memory loss. The patient does not have insomnia.    Exam: Physical Exam  Constitutional: She is oriented to person, place, and time.  HENT:  Nose: No mucosal edema.  Mouth/Throat: No oropharyngeal exudate or posterior oropharyngeal edema.  Eyes: Conjunctivae, EOM and lids are normal. Pupils are equal, round, and reactive to light.  Neck: No JVD present. Carotid bruit is not present. No edema present. No thyroid mass and no thyromegaly present.  Cardiovascular: Regular rhythm, S1 normal and S2 normal. Exam reveals no gallop.  No murmur heard. Pulses:       Dorsalis pedis pulses are 2+ on the right side, and 2+ on the left side.  Respiratory: No respiratory distress. She has no decreased breath sounds. She has no wheezes. She has no rhonchi. She has no rales.  GI: Soft. Bowel sounds are normal. There is no tenderness.  Musculoskeletal:       Right ankle: She exhibits swelling.       Left ankle: She exhibits swelling.  Lymphadenopathy:    She has no cervical adenopathy.  Neurological: She is alert and oriented to person, place, and time. No cranial nerve deficit.  Skin: Skin is warm. No rash noted. Nails show no clubbing.  Psychiatric: She has a normal mood and affect.      Data Reviewed: Basic Metabolic Panel: Recent Labs  Lab 08/18/17 1703 08/19/17 8546 08/22/17 0420 08/23/17 0530 08/24/17 0450  NA 132* 135 138 139 141  K 4.1 4.2 5.0 4.0 3.9  CL 98* 100* 104 101 101  CO2 24 27 27 28 30   GLUCOSE 133* 118* 126* 128* 127*  BUN 52* 45* 27* 20 17  CREATININE 1.06* 0.91 0.79 0.65 0.74  CALCIUM 8.7* 8.4* 8.8* 8.9 8.8*  MG  --   --   --  1.6* 1.5*   Liver Function Tests: No results for input(s): AST, ALT, ALKPHOS, BILITOT, PROT, ALBUMIN in the last 168 hours. No results for input(s): LIPASE, AMYLASE in the last 168 hours. No results for input(s): AMMONIA in the last 168 hours. CBC: Recent Labs  Lab 08/22/17 0420 08/25/17 0456  WBC 5.3 10.3  HGB 10.4* 10.5*  HCT 33.1* 33.5*  MCV 83.7 84.2  PLT 187 205   Cardiac Enzymes: No results for input(s): CKTOTAL, CKMB, CKMBINDEX, TROPONINI in the last 168 hours. BNP (last 3 results) Recent Labs    08/04/17 1552 08/16/17 0925  BNP 175.0* 655.0*    ProBNP (last 3 results) No results for input(s): PROBNP in the last 8760 hours.  CBG: Recent Labs  Lab 08/24/17 1117 08/24/17 1632 08/24/17 2142 08/25/17 0801 08/25/17 1138  GLUCAP 194* 107* 158* 135* 180*    Recent Results (from the past 240 hour(s))  MRSA PCR Screening     Status: Abnormal   Collection Time: 08/16/17   1:39 PM  Result Value Ref Range Status   MRSA by PCR POSITIVE (A) NEGATIVE Final    Comment:        [name] (FDA approved for NASAL specimens only), is one component of a comprehensive MRSA colonization surveillance program. It is not intended to diagnose MRSA infection nor to guide or monitor treatment for MRSA infections. RESULT CALLED TO, READ BACK BY AND VERIFIED WITH: BRITTNEY RUDD @ 3428 ON 08/16/2017 BY CAF Performed at [facility], [address] [place], [place] [address]   Culture, blood (routine x 2)     Status: None   Collection Time: 08/16/17  4:49 PM  Result Value Ref Range Status   Specimen Description BLOOD LEFT ANTECUBITAL  Final   Special Requests   Final    BOTTLES DRAWN AEROBIC AND ANAEROBIC Blood Culture results may not be optimal due to an inadequate volume of blood received in culture bottles   Culture   Final    NO GROWTH 5 DAYS Performed at [facility], [address] [place], [place] [address]    Report Status 08/21/2017 FINAL  Final  Culture, blood (routine x 2)     Status: None   Collection Time: 08/16/17  4:49 PM  Result Value Ref Range Status   Specimen Description BLOOD LEFT ANTECUBITAL  Final   Special Requests   Final    BOTTLES DRAWN AEROBIC AND ANAEROBIC Blood Culture results may not be optimal due to an inadequate volume of blood received in culture bottles   Culture   Final    NO GROWTH 5 DAYS Performed at [facility], [address] [place], [place] [address]    Report Status 08/21/2017 FINAL  Final      Scheduled Meds: . albuterol  2.5 mg Nebulization Q6H  . apixaban  5 mg Oral BID  . calcium carbonate  1 tablet Oral BID  . digoxin  0.125 mg Oral Daily  . gabapentin  200 mg Oral TID  . insulin aspart  0-20 Units Subcutaneous TID WC  . insulin aspart  0-5 Units Subcutaneous QHS  . levofloxacin  250 mg Oral Daily  . losartan  25 mg Oral Daily  . methylPREDNISolone (SOLU-MEDROL)  injection  60 mg Intravenous Q24H  . metoprolol succinate  200 mg Oral Daily  . pantoprazole  40 mg Oral Daily  . potassium chloride  20 mEq Oral Daily  . protein supplement shake  11 oz Oral BID BM  . rosuvastatin  10 mg Oral Daily  . senna-docusate  1 tablet Oral BID  . sildenafil  20 mg Oral TID  . sodium chloride flush  3 mL Intravenous Q12H  . spironolactone  12.5 mg Oral Daily  . torsemide  20 mg Oral Daily   Continuous Infusions: . sodium chloride  Assessment/Plan:  1. Acute on chronic hypoxic hypercapnic respiratory failure.  Patient required BiPAP on presentation and now back to usual 4 L nasal cannula, patient has CPAP at night, chronic oxygen at home Atrial fibrillation with rapid ventricular response.  Patient is/pcardioversion and now in normal sinus rhythm.  In and out of atrial fibrillation, cardiology is watching closely to see if she needs to be on Tikosyn.  Patient on Eliquis for anticoagulation Appreciate cardiology following.  Regarding her cardiomyopathy patient is to be on beta-blockers, Cozaar, started on Aldactone.  Patient needs echo in 3 months for LV function evaluation and follow-up.  2. Chest pain ldue to atrial fibrillation.  Seen by cardiology.  Hyperlipidemia unspecified on Crestor, 3. Chronic diastolic congestive heart failure.  Started on torsemide.  She takes torsemide at home..  Mild hyperkalemia so we discontinued potassium supplements, monitor kidney function closely.  4. History of type 2 diabetes mellitus.   at this point since hemoglobin A1c is actually low. 5. Morbid obesity weight loss needed 6. Generalized weakness: Physical therapy,recommended  skilled nursing.. Patient wants to go to [facility]. #8 mild abdominal pain likely gastritis: Patient can take Tums.,  AddedPPI.  Continue MiraLAX for constipation. COPD, chronic interstitial lung disease: On chronic oxygen at home.  No wheezing. #10 UTI: Added Levaquin.  Follow urine  cultures. 11.  No acute temperature, cough, wheezing.  Chest x-ray ordered, added small dose IV steroid, continue bronchodilators, we will add Pulmicort nebulizer. 12.  History of pulmonary hypertension: Patient is on Revatio. Code Status:     Code Status Orders  (From admission, onward)        Start     Ordered   08/16/17 1614  Full code  Continuous     08/16/17 1613    Code Status History    Date Active Date Inactive Code Status Order ID Comments User Context   03/30/2015 21:52 04/03/2015 21:22 Full Code [id number]  Etta Quill, DO Inpatient   03/27/2015 22:08 03/28/2015 17:37 Full Code [id number]  Nicholes Mango, MD ED   01/09/2014 14:18 01/10/2014 18:03 Full Code [id number]  Deboraha Sprang, MD Inpatient   01/16/2013 23:35 01/27/2013 20:11 Full Code [id number]  Collene Gobble, MD Inpatient     Family Communication: Discussed with patient Disposition to skilled nursing facility and patient is more stable.  Consultants:  Critical care specialist  Cardiology  Time spent: 28 minutes  [place]

## 2017-08-25 NOTE — Plan of Care (Signed)
  Not Progressing Activity: Ability to tolerate increased activity will improve 08/25/2017 0255 - Not Progressing by Jeri Cos, RN

## 2017-08-25 NOTE — Progress Notes (Signed)
Patient has low grade fever.  Requesting tylenol.  Continue to monitor.

## 2017-08-26 LAB — GLUCOSE, CAPILLARY
Glucose-Capillary: 136 mg/dL — ABNORMAL HIGH (ref 65–99)
Glucose-Capillary: 150 mg/dL — ABNORMAL HIGH (ref 65–99)
Glucose-Capillary: 209 mg/dL — ABNORMAL HIGH (ref 65–99)
Glucose-Capillary: 234 mg/dL — ABNORMAL HIGH (ref 65–99)

## 2017-08-26 LAB — URINE CULTURE

## 2017-08-26 MED ORDER — METHYLPREDNISOLONE SODIUM SUCC 125 MG IJ SOLR
60.0000 mg | Freq: Two times a day (BID) | INTRAMUSCULAR | Status: DC
  Administered 2017-08-26 – 2017-08-27 (×4): 60 mg via INTRAVENOUS
  Filled 2017-08-26 (×4): qty 2

## 2017-08-26 MED ORDER — LEVOFLOXACIN 500 MG PO TABS
250.0000 mg | ORAL_TABLET | Freq: Once | ORAL | Status: AC
  Administered 2017-08-26: 250 mg via ORAL
  Filled 2017-08-26: qty 1

## 2017-08-26 MED ORDER — LEVOFLOXACIN 500 MG PO TABS
500.0000 mg | ORAL_TABLET | Freq: Every day | ORAL | Status: DC
  Administered 2017-08-27 – 2017-08-28 (×2): 500 mg via ORAL
  Filled 2017-08-26 (×2): qty 1

## 2017-08-26 MED ORDER — FUROSEMIDE 10 MG/ML IJ SOLN
40.0000 mg | Freq: Two times a day (BID) | INTRAMUSCULAR | Status: DC
  Administered 2017-08-26 – 2017-08-27 (×3): 40 mg via INTRAVENOUS
  Filled 2017-08-26 (×3): qty 4

## 2017-08-26 MED ORDER — ADULT MULTIVITAMIN LIQUID CH
15.0000 mL | Freq: Every day | ORAL | Status: DC
  Administered 2017-08-26 – 2017-08-28 (×3): 15 mL via ORAL
  Filled 2017-08-26 (×3): qty 15

## 2017-08-26 NOTE — Progress Notes (Deleted)
Pt requested a diaper/depend for to wear tonight. Diaper/Depend was given. Will continue to monitor

## 2017-08-26 NOTE — Progress Notes (Signed)
Initial Nutrition Assessment  DOCUMENTATION CODES:   Obesity unspecified  INTERVENTION:   Premier Protein BID, each supplement provides 160 kcal and 30 grams of protein.   MVI liquid daily  Liberalize diet  NUTRITION DIAGNOSIS:   Inadequate oral intake related to acute illness as evidenced by meal completion < 50%.  GOAL:   Patient will meet greater than or equal to 90% of their needs  MONITOR:   PO intake, Supplement acceptance, Weight trends, Labs, I & O's  REASON FOR ASSESSMENT:   LOS    ASSESSMENT:   72 year old female with a past medical history remarkable for hypertension, hyperlipidemia, obesity/obstructive sleep apnea, deep venous thrombosis, was not a anticoagulation candidate, status post filter placement, COPD, chronic respiratory failure, diastolic heart dysfunction, renal failure, type 2 diabetes, thyroid nodule, presented with 1 week of increasing shortness of breath.   Met with pt in room today. Pt reports poor appetite and oral intake for 2 months pta. Pt reports that her appetite continues to be poor today. Pt is eating about 50% of her meals in hospital. Pt did drink a Premier Protein today and is willing to drink supplements. Pt can have Ensure or Glucerna if she prefers in order to change up flavors. Pt requesting liquid MVI as she is tired of swallowing pills. RD will liberalize to encourage increased intake. Per chart, pt is weight stable with some minor weight fluctuations r/t fluid changes.   Medications reviewed and include: tums, lasix, insulin, solu-medrol, protonix, KCl, senokot, aldactone  Labs reviewed:   Nutrition-Focused physical exam completed. Findings are no fat depletion, no muscle depletion, and mild edema.   Diet Order:  Diet Carb Modified Fluid consistency: Thin; Room service appropriate? Yes  EDUCATION NEEDS:   Education needs have been addressed  Skin: Reviewed RN Assessment  Last BM:  3/6- type 6  Height:   Ht Readings  from Last 1 Encounters:  08/16/17 '5\' 1"'  (1.549 m)    Weight:   Wt Readings from Last 1 Encounters:  08/26/17 220 lb 3.2 oz (99.9 kg)    Ideal Body Weight:  47.7 kg  BMI:  Body mass index is 41.61 kg/m.  Estimated Nutritional Needs:   Kcal:  1600-1900kcal/day   Protein:  100-110g/day   Fluid:  per MD  Koleen Distance MS, RD, LDN Pager #[phone] After Hours Pager: [phone]

## 2017-08-26 NOTE — Plan of Care (Signed)
  Progressing Clinical Measurements: Will remain free from infection 08/26/2017 0322 - Progressing by Liliane Channel, RN Respiratory complications will improve 08/26/2017 0322 - Progressing by Liliane Channel, RN Pain Managment: General experience of comfort will improve 08/26/2017 0322 - Progressing by Liliane Channel, RN Safety: Ability to remain free from injury will improve 08/26/2017 0322 - Progressing by Liliane Channel, RN

## 2017-08-26 NOTE — Progress Notes (Signed)
Patient ID: MATISSE ROSKELLEY, female   DOB: [birth year], 72 y.o.   MRN: [id number]  [place] PROGRESS NOTE  JAZMENE RACZ GNO:[id number] DOB: [birth year] DOA: 08/16/2017 PCP: [name], MD  HPI/Subjective: Patient feels okay.  Having a lot of cough and wheezing.  Still feels very weak.  Objective: Vitals:   08/26/17 0717 08/26/17 1011  BP:  136/63  Pulse:  75  Resp:    Temp:    SpO2: 93% 97%    Filed Weights   08/24/17 0427 08/25/17 0346 08/26/17 0414  Weight: 100.4 kg (221 lb 4.8 oz) 100.6 kg (221 lb 11.2 oz) 99.9 kg (220 lb 3.2 oz)    ROS: Review of Systems  Constitutional: Negative for chills and fever.  Eyes: Negative for blurred vision.  Respiratory: Positive for cough, shortness of breath and wheezing.   Cardiovascular: Negative for chest pain.  Gastrointestinal: Negative for abdominal pain, constipation, diarrhea, nausea and vomiting.  Genitourinary: Negative for dysuria.  Musculoskeletal: Negative for joint pain.  Neurological: Negative for dizziness and headaches.   Exam: Physical Exam  Constitutional: She is oriented to person, place, and time.  HENT:  Nose: No mucosal edema.  Mouth/Throat: No oropharyngeal exudate or posterior oropharyngeal edema.  Eyes: Conjunctivae, EOM and lids are normal. Pupils are equal, round, and reactive to light.  Neck: No JVD present. Carotid bruit is not present. No edema present. No thyroid mass and no thyromegaly present.  Cardiovascular: S1 normal and S2 normal. Exam reveals no gallop.  No murmur heard. Pulses:      Dorsalis pedis pulses are 2+ on the right side, and 2+ on the left side.  Respiratory: No respiratory distress. She has decreased breath sounds in the right middle field, the right lower field, the left middle field and the left lower field. She has no wheezes. She has no rhonchi. She has rales in the right middle field, the right lower field, the left middle field and the left lower field.  GI: Soft.  Bowel sounds are normal. There is no tenderness.  Musculoskeletal:       Right ankle: She exhibits swelling.       Left ankle: She exhibits swelling.  Lymphadenopathy:    She has no cervical adenopathy.  Neurological: She is alert and oriented to person, place, and time. No cranial nerve deficit.  Skin: Skin is warm. No rash noted. Nails show no clubbing.  Psychiatric: She has a normal mood and affect.      Data Reviewed: Basic Metabolic Panel: Recent Labs  Lab 08/22/17 0420 08/23/17 0530 08/24/17 0450 08/25/17 1549  NA 138 139 141  --   K 5.0 4.0 3.9  --   CL 104 101 101  --   CO2 27 28 30   --   GLUCOSE 126* 128* 127*  --   BUN 27* 20 17  --   CREATININE 0.79 0.65 0.74  --   CALCIUM 8.8* 8.9 8.8*  --   MG  --  1.6* 1.5* 1.7   CBC: Recent Labs  Lab 08/22/17 0420 08/25/17 0456  WBC 5.3 10.3  HGB 10.4* 10.5*  HCT 33.1* 33.5*  MCV 83.7 84.2  PLT 187 205   BNP (last 3 results) Recent Labs    08/04/17 1552 08/16/17 0925  BNP 175.0* 655.0*     CBG: Recent Labs  Lab 08/25/17 1138 08/25/17 1719 08/25/17 2113 08/26/17 0841 08/26/17 1232  GLUCAP 180* 144* 186* 136* 150*    Recent Results (from  the past 240 hour(s))  Culture, blood (routine x 2)     Status: None   Collection Time: 08/16/17  4:49 PM  Result Value Ref Range Status   Specimen Description BLOOD LEFT ANTECUBITAL  Final   Special Requests   Final    BOTTLES DRAWN AEROBIC AND ANAEROBIC Blood Culture results may not be optimal due to an inadequate volume of blood received in culture bottles   Culture   Final    NO GROWTH 5 DAYS Performed at [facility], [address] [place], [place] [address]    Report Status 08/21/2017 FINAL  Final  Culture, blood (routine x 2)     Status: None   Collection Time: 08/16/17  4:49 PM  Result Value Ref Range Status   Specimen Description BLOOD LEFT ANTECUBITAL  Final   Special Requests   Final    BOTTLES DRAWN AEROBIC AND ANAEROBIC Blood Culture  results may not be optimal due to an inadequate volume of blood received in culture bottles   Culture   Final    NO GROWTH 5 DAYS Performed at [facility], [address] [place], [place] [address]    Report Status 08/21/2017 FINAL  Final  Urine Culture     Status: Abnormal   Collection Time: 08/24/17  4:34 PM  Result Value Ref Range Status   Specimen Description   Final    URINE, RANDOM Performed at [facility], [address], [place], [place] [address]    Special Requests   Final    NONE Performed at [facility], [address] [place], [place] [address]    Culture MULTIPLE SPECIES PRESENT, SUGGEST RECOLLECTION (A)  Final   Report Status 08/26/2017 FINAL  Final     Studies: Dg Chest 1 View  Result Date: 08/25/2017 CLINICAL DATA:  Cough and dyspnea with wheeze. EXAM: CHEST 1 VIEW COMPARISON:  08/16/2017 FINDINGS: Stable cardiomegaly with moderate aortic atherosclerosis along the transverse portion. Left-sided pacemaker apparatus with leads in the right atrium and right ventricle are unchanged. Redemonstration of moderate pulmonary edema without significant change. No alveolar consolidation, effusion or pneumothoraces. No acute osseous abnormality. Osteoarthritis of the glenohumeral joints bilaterally with spurring off the inferomedial aspect of the humeral heads. IMPRESSION: 1. Stable cardiomegaly with aortic atherosclerosis. 2. Pulmonary edema without significant change. Electronically Signed   By: Ashley Royalty M.D.   On: 08/25/2017 17:02    Scheduled Meds: . albuterol  2.5 mg Nebulization Q6H  . apixaban  5 mg Oral BID  . calcium carbonate  1 tablet Oral BID  . digoxin  0.125 mg Oral Daily  . furosemide  40 mg Intravenous BID  . gabapentin  200 mg Oral TID  . insulin aspart  0-20 Units Subcutaneous TID WC  . insulin aspart  0-5 Units Subcutaneous QHS  . levofloxacin  250 mg Oral Once  . [START ON 08/27/2017] levofloxacin  500 mg Oral Daily  .  losartan  25 mg Oral Daily  . methylPREDNISolone (SOLU-MEDROL) injection  60 mg Intravenous Q12H  . metoprolol succinate  200 mg Oral Daily  . multivitamin  15 mL Oral Daily  . pantoprazole  40 mg Oral Daily  . potassium chloride  20 mEq Oral Daily  . protein supplement shake  11 oz Oral BID BM  . rosuvastatin  10 mg Oral Daily  . senna-docusate  1 tablet Oral BID  . sildenafil  20 mg Oral TID  . sodium chloride flush  3  mL Intravenous Q12H  . spironolactone  12.5 mg Oral Daily   Continuous Infusions: . sodium chloride      Assessment/Plan:  1. Acute on chronic  hypoxic hypercapnic respiratory failure.  Patient on 4 L of oxygen 2. Acute on chronic diastolic congestive heart failure.  Switch back to IV Lasix.   3. Atrial fibrillation with rapid ventricular response now in normal sinus rhythm after cardioversion and medications.  Patient on Eliquis.  Patient on metoprolol and digoxin 4.  generalized weakness physical therapy recommends rehab  5. hyperlipidemia unspecified on Crestor 6. Current on Protonix 7. Essential hypertension on metoprolol 8. COPD exacerbation on Solu-Medrol antibiotic and nebulizer treatments 9. Pulmonary hypertension on Revatio 10. Morbid obesity weight loss needed    Code Status:     Code Status Orders  (From admission, onward)        Start     Ordered   08/16/17 1614  Full code  Continuous     08/16/17 1613    Code Status History    Date Active Date Inactive Code Status Order ID Comments User Context   03/30/2015 21:52 04/03/2015 21:22 Full Code [id number]  Etta Quill, DO Inpatient   03/27/2015 22:08 03/28/2015 17:37 Full Code [id number]  Nicholes Mango, MD ED   01/09/2014 14:18 01/10/2014 18:03 Full Code [id number]  Deboraha Sprang, MD Inpatient   01/16/2013 23:35 01/27/2013 20:11 Full Code [id number]  Collene Gobble, MD Inpatient     Disposition Plan: evaluate lungs daily to see when she is stable to go to  rehab  Consultants:  Cardiology  Antibiotics:  Levaquin  Time spent: 28 minutes  [place]

## 2017-08-27 LAB — GLUCOSE, CAPILLARY
Glucose-Capillary: 199 mg/dL — ABNORMAL HIGH (ref 65–99)
Glucose-Capillary: 237 mg/dL — ABNORMAL HIGH (ref 65–99)
Glucose-Capillary: 234 mg/dL — ABNORMAL HIGH (ref 65–99)
Glucose-Capillary: 265 mg/dL — ABNORMAL HIGH (ref 65–99)

## 2017-08-27 MED ORDER — BUDESONIDE 0.5 MG/2ML IN SUSP
0.5000 mg | Freq: Two times a day (BID) | RESPIRATORY_TRACT | Status: DC
  Administered 2017-08-27 – 2017-08-28 (×3): 0.5 mg via RESPIRATORY_TRACT
  Filled 2017-08-27 (×3): qty 2

## 2017-08-27 NOTE — Care Management Important Message (Signed)
Important Message  Patient Details  Name: Debra Saunders MRN: [id number] Date of Birth: 03-30-1946   Medicare Important Message Given:  Yes Signed IM notice given    Katrina Stack, RN 08/27/2017, 4:32 PM

## 2017-08-27 NOTE — Progress Notes (Signed)
Inpatient Diabetes Program Recommendations  AACE/ADA: New Consensus Statement on Inpatient Glycemic Control (2019)  Target Ranges:  Prepandial:   less than 140 mg/dL      [facility] postprandial:   less than 180 mg/dL (1-2 hours)      Critically ill patients:  140 - 180 mg/dL   Results for Debra Saunders, Debra Saunders (MRN [id number]) as of 08/27/2017 11:15  Ref. Range 08/26/2017 08:41 08/26/2017 12:32 08/26/2017 17:39 08/26/2017 20:20 08/27/2017 08:34  Glucose-Capillary Latest Ref Range: 65 - 99 mg/dL 136 (H) 150 (H) 209 (H) 234 (H) 199 (H)   Review of Glycemic Control  Diabetes history: DM2 Outpatient Diabetes medications: Metformin 6144 mg BID, [name] 3.15 mg Q week Current orders for Inpatient glycemic control: Novolog 0-20 units TID with meals, Novolog 0-5 units QHS; Solumedrol 60 mg Q12H  Inpatient Diabetes Program Recommendations: Insulin - Meal Coverage: If steroids are continued, please consider ordering Novolog 3 units TID with meals for meal coverage if patient eats at least 50% of meals. Diet: Please consider changing diet from Regular to Carb Modified.  Thanks, Barnie Alderman, RN, MSN, CDE Diabetes Coordinator Inpatient Diabetes Program [phone] (Team Pager from 8am to 5pm)

## 2017-08-27 NOTE — Progress Notes (Signed)
Physical Therapy Treatment Patient Details Name: Debra Saunders MRN: [id number] DOB: Jul 08, 1945 Today's Date: 08/27/2017    History of Present Illness Pt is a 72 y.o. F who presented to hospital with SOB, chest pain, and tachycardia.  Pt admitted 08/18/17, diagnosis of chest pain, SOB, and tachycardia. PMHx includes: CHF, chronic respiratory failure, COPD, intracranial hemmorage, Hyperlipidemia, obesity, DM, urinary incontinence, and chest pain.     PT Comments    Pt ambulated 40 ft w/ RW and CGA on 4L of continuous oxygen.  Oxygen sats dropped post ambulation from 94% to 88%, but she recovered within a few minutes of sitting rest break to 94%.  Pt demonstrated overall good balance with bed mobility, transfers, and ambulation.  However, Pt demonstrates SOB and onset of fatigue with minimal activity while on 4L of oxygen. Pt will benefit from continued PT in order to progress strength and activity tolerance.   Follow Up Recommendations  SNF     Equipment Recommendations  Rolling walker with 5" wheels    Recommendations for Other Services       Precautions / Restrictions Precautions Precautions: Fall Restrictions Weight Bearing Restrictions: Yes    Mobility  Bed Mobility Overal bed mobility: Modified Independent Bed Mobility: Rolling;Supine to Sit Rolling: Modified independent (Device/Increase time)   Supine to sit: Modified independent (Device/Increase time)     General bed mobility comments: Pt demonstrated overall good balance and good technique with rolling and supine to sit.  However, Pt was on 4L of oxygen and reported some SOB with activity.  Transfers Overall transfer level: Needs assistance Equipment used: Rolling walker (2 wheeled) Transfers: Sit to/from Stand Sit to Stand: Mod assist         General transfer comment: PT required mod assist with sit to stand from EOB onto standing weight scale platform; BUE were supported by grab bars on scale and vc's were  required to scoot to edge of bed and lean forward before pushing through BLE.  Ambulation/Gait Ambulation/Gait assistance: Min guard Ambulation Distance (Feet): 40 Feet Assistive device: Rolling walker (2 wheeled) Gait Pattern/deviations: Step-through pattern;Decreased stride length Gait velocity: decreased   General Gait Details: Pt demonstrated overall good balance and safe use of RW during ambulation in room.  However, Pt was on 4L of oxygen and reported SOB and fatigue.  Oxygen sats dropped post ambulation from 94% to 88%, but returned to 94% after a few minute rest break sitting in chair and performing diaphramatic breathing.   Stairs            Wheelchair Mobility    Modified Rankin (Stroke Patients Only)       Balance Overall balance assessment: Needs assistance Sitting-balance support: Bilateral upper extremity supported Sitting balance-Leahy Scale: Good Sitting balance - Comments: Pt demonstrated good sitting balance while managing clothing sitting EOB.   Standing balance support: Bilateral upper extremity supported Standing balance-Leahy Scale: Poor Standing balance comment: Pt requires BUE support w/ RW and CGA for standing prior to ambulation.                            Cognition Arousal/Alertness: Awake/alert Behavior During Therapy: WFL for tasks assessed/performed Overall Cognitive Status: Within Functional Limits for tasks assessed                                 General Comments: Pt A&O x 4  Exercises      General Comments   Pt agreeable to session.     Pertinent Vitals/Pain Pain Assessment: No/denies pain Pain Score: 0-No pain Pain Intervention(s): Limited activity within patient's tolerance;Monitored during session    Home Living                      Prior Function            PT Goals (current goals can now be found in the care plan section) Acute Rehab PT Goals Patient Stated Goal: To regain  strength to go home and be able to enter home and step up onto her bed. PT Goal Formulation: With patient Time For Goal Achievement: 09/05/17 Potential to Achieve Goals: Good Progress towards PT goals: Progressing toward goals    Frequency    Min 2X/week      PT Plan Current plan remains appropriate    Co-evaluation              AM-PAC PT "6 Clicks" Daily Activity  Outcome Measure  Difficulty turning over in bed (including adjusting bedclothes, sheets and blankets)?: A Little Difficulty moving from lying on back to sitting on the side of the bed? : A Little Difficulty sitting down on and standing up from a chair with arms (e.g., wheelchair, bedside commode, etc,.)?: Unable Help needed moving to and from a bed to chair (including a wheelchair)?: A Little Help needed walking in hospital room?: A Little Help needed climbing 3-5 steps with a railing? : A Little 6 Click Score: 16    End of Session Equipment Utilized During Treatment: Gait belt;Oxygen Activity Tolerance: Patient limited by fatigue Patient left: in chair;with call bell/phone within reach;with chair alarm set Nurse Communication: Mobility status;Other (comment)(RN notified of oxygen sats dropping from 94% to 88% post ambulation which recovered to 94% after few min of sitting rest break.) PT Visit Diagnosis: Unsteadiness on feet (R26.81);Muscle weakness (generalized) (M62.81);Pain     Time: 1205-1233 PT Time Calculation (min) (ACUTE ONLY): 28 min  Charges:                       G Codes:        Imagene Boss Mondrian-Pardue, SPT 08/27/2017, 1:41 PM

## 2017-08-27 NOTE — Clinical Social Work Note (Signed)
CSW spoke to [employer] [facility] [place] and they have received insurance authorization for patient to go to SNF.  CSW to continue to follow patient's progress throughout.  Jones Broom. [place], MSW, [place]  08/27/2017 11:47 AM

## 2017-08-27 NOTE — Progress Notes (Signed)
Patient ID: Debra Saunders, female   DOB: [birth year], 72 y.o.   MRN: [id number]  [facility] PROGRESS NOTE  Debra Saunders ATF:[id number] DOB: Mar 25, 1946 DOA: 08/16/2017 PCP: [name], MD  HPI/Subjective: Patient thinks her breathing is a little bit better today than yesterday.  Still with wheezing cough and shortness of breath.  Objective: Vitals:   08/27/17 1230 08/27/17 1355  BP:    Pulse: 61   Resp:    Temp:    SpO2: 94% 94%    Filed Weights   08/25/17 0346 08/26/17 0414 08/27/17 0834  Weight: 100.6 kg (221 lb 11.2 oz) 99.9 kg (220 lb 3.2 oz) 98.9 kg (218 lb)    ROS: Review of Systems  Constitutional: Negative for chills and fever.  Eyes: Negative for blurred vision.  Respiratory: Positive for cough, shortness of breath and wheezing.   Cardiovascular: Negative for chest pain.  Gastrointestinal: Negative for abdominal pain, constipation, diarrhea, nausea and vomiting.  Genitourinary: Negative for dysuria.  Musculoskeletal: Negative for joint pain.  Neurological: Negative for dizziness and headaches.   Exam: Physical Exam  Constitutional: She is oriented to person, place, and time.  HENT:  Nose: No mucosal edema.  Mouth/Throat: No oropharyngeal exudate or posterior oropharyngeal edema.  Eyes: Conjunctivae, EOM and lids are normal. Pupils are equal, round, and reactive to light.  Neck: No JVD present. Carotid bruit is not present. No edema present. No thyroid mass and no thyromegaly present.  Cardiovascular: S1 normal and S2 normal. Exam reveals no gallop.  No murmur heard. Pulses:      Dorsalis pedis pulses are 2+ on the right side, and 2+ on the left side.  Respiratory: No respiratory distress. She has decreased breath sounds in the right middle field, the right lower field, the left middle field and the left lower field. She has wheezes in the right middle field and the left middle field. She has no rhonchi. She has rales in the right lower field and  the left lower field.  GI: Soft. Bowel sounds are normal. There is no tenderness.  Musculoskeletal:       Right ankle: She exhibits swelling.       Left ankle: She exhibits swelling.  Lymphadenopathy:    She has no cervical adenopathy.  Neurological: She is alert and oriented to person, place, and time. No cranial nerve deficit.  Skin: Skin is warm. No rash noted. Nails show no clubbing.  Psychiatric: She has a normal mood and affect.      Data Reviewed: Basic Metabolic Panel: Recent Labs  Lab 08/22/17 0420 08/23/17 0530 08/24/17 0450 08/25/17 1549  NA 138 139 141  --   K 5.0 4.0 3.9  --   CL 104 101 101  --   CO2 27 28 30   --   GLUCOSE 126* 128* 127*  --   BUN 27* 20 17  --   CREATININE 0.79 0.65 0.74  --   CALCIUM 8.8* 8.9 8.8*  --   MG  --  1.6* 1.5* 1.7   CBC: Recent Labs  Lab 08/22/17 0420 08/25/17 0456  WBC 5.3 10.3  HGB 10.4* 10.5*  HCT 33.1* 33.5*  MCV 83.7 84.2  PLT 187 205   BNP (last 3 results) Recent Labs    08/04/17 1552 08/16/17 0925  BNP 175.0* 655.0*     CBG: Recent Labs  Lab 08/26/17 1232 08/26/17 1739 08/26/17 2020 08/27/17 0834 08/27/17 1207  GLUCAP 150* 209* 234* 199* 237*  Recent Results (from the past 240 hour(s))  Urine Culture     Status: Abnormal   Collection Time: 08/24/17  4:34 PM  Result Value Ref Range Status   Specimen Description   Final    URINE, RANDOM Performed at [facility], [address], [place], [place] [address]    Special Requests   Final    NONE Performed at [facility], [address] [place], [place] [address]    Culture MULTIPLE SPECIES PRESENT, SUGGEST RECOLLECTION (A)  Final   Report Status 08/26/2017 FINAL  Final     Studies: Dg Chest 1 View  Result Date: 08/25/2017 CLINICAL DATA:  Cough and dyspnea with wheeze. EXAM: CHEST 1 VIEW COMPARISON:  08/16/2017 FINDINGS: Stable cardiomegaly with moderate aortic atherosclerosis along the transverse portion. Left-sided  pacemaker apparatus with leads in the right atrium and right ventricle are unchanged. Redemonstration of moderate pulmonary edema without significant change. No alveolar consolidation, effusion or pneumothoraces. No acute osseous abnormality. Osteoarthritis of the glenohumeral joints bilaterally with spurring off the inferomedial aspect of the humeral heads. IMPRESSION: 1. Stable cardiomegaly with aortic atherosclerosis. 2. Pulmonary edema without significant change. Electronically Signed   By: Ashley Royalty M.D.   On: 08/25/2017 17:02    Scheduled Meds: . albuterol  2.5 mg Nebulization Q6H  . apixaban  5 mg Oral BID  . budesonide (PULMICORT) nebulizer solution  0.5 mg Nebulization BID  . calcium carbonate  1 tablet Oral BID  . digoxin  0.125 mg Oral Daily  . furosemide  40 mg Intravenous BID  . gabapentin  200 mg Oral TID  . insulin aspart  0-20 Units Subcutaneous TID WC  . insulin aspart  0-5 Units Subcutaneous QHS  . levofloxacin  500 mg Oral Daily  . losartan  25 mg Oral Daily  . methylPREDNISolone (SOLU-MEDROL) injection  60 mg Intravenous Q12H  . metoprolol succinate  200 mg Oral Daily  . multivitamin  15 mL Oral Daily  . pantoprazole  40 mg Oral Daily  . potassium chloride  20 mEq Oral Daily  . protein supplement shake  11 oz Oral BID BM  . rosuvastatin  10 mg Oral Daily  . senna-docusate  1 tablet Oral BID  . sildenafil  20 mg Oral TID  . sodium chloride flush  3 mL Intravenous Q12H  . spironolactone  12.5 mg Oral Daily   Continuous Infusions: . sodium chloride      Assessment/Plan:  1. Acute on chronic  hypoxic hypercapnic respiratory failure.  Patient on 4 L of oxygen.  Back to baseline oxygen. 2. Acute on chronic diastolic congestive heart failure.  Switched back to IV Lasix.  Patient moving a little bit better air now. 3. COPD exacerbation on Solu-Medrol and nebulizer treatments 4. Atrial fibrillation with rapid ventricular response now in normal sinus rhythm after  cardioversion and medications.  Patient on Eliquis.  Patient on metoprolol and digoxin 5. generalized weakness physical therapy recommends rehab  6. hyperlipidemia unspecified on Crestor 7. Current on Protonix 8. Essential hypertension on metoprolol 9. Pulmonary hypertension on Revatio 10. Morbid obesity weight loss needed    Code Status:     Code Status Orders  (From admission, onward)        Start     Ordered   08/16/17 1614  Full code  Continuous     08/16/17 1613    Code Status History    Date Active Date Inactive Code Status Order ID Comments User Context  03/30/2015 21:52 04/03/2015 21:22 Full Code [id number]  Etta Quill, DO Inpatient   03/27/2015 22:08 03/28/2015 17:37 Full Code [id number]  Nicholes Mango, MD ED   01/09/2014 14:18 01/10/2014 18:03 Full Code [id number]  Deboraha Sprang, MD Inpatient   01/16/2013 23:35 01/27/2013 20:11 Full Code [id number]  Collene Gobble, MD Inpatient     Disposition Plan: evaluate lungs daily to see when she is stable to go to rehab  Consultants:  Cardiology  Antibiotics:  Levaquin  Time spent: 26 minutes  [place]

## 2017-08-28 ENCOUNTER — Telehealth: Payer: Self-pay | Admitting: Cardiovascular Disease

## 2017-08-28 DIAGNOSIS — Z7401 Bed confinement status: Secondary | ICD-10-CM | POA: Diagnosis not present

## 2017-08-28 DIAGNOSIS — Z79899 Other long term (current) drug therapy: Secondary | ICD-10-CM | POA: Diagnosis not present

## 2017-08-28 DIAGNOSIS — I10 Essential (primary) hypertension: Secondary | ICD-10-CM | POA: Diagnosis not present

## 2017-08-28 DIAGNOSIS — Z5189 Encounter for other specified aftercare: Secondary | ICD-10-CM | POA: Diagnosis not present

## 2017-08-28 DIAGNOSIS — Z9981 Dependence on supplemental oxygen: Secondary | ICD-10-CM | POA: Diagnosis not present

## 2017-08-28 DIAGNOSIS — M6281 Muscle weakness (generalized): Secondary | ICD-10-CM | POA: Diagnosis not present

## 2017-08-28 DIAGNOSIS — J9601 Acute respiratory failure with hypoxia: Secondary | ICD-10-CM | POA: Diagnosis not present

## 2017-08-28 DIAGNOSIS — I509 Heart failure, unspecified: Secondary | ICD-10-CM | POA: Diagnosis not present

## 2017-08-28 DIAGNOSIS — J441 Chronic obstructive pulmonary disease with (acute) exacerbation: Secondary | ICD-10-CM | POA: Diagnosis not present

## 2017-08-28 DIAGNOSIS — R531 Weakness: Secondary | ICD-10-CM | POA: Diagnosis not present

## 2017-08-28 DIAGNOSIS — I890 Lymphedema, not elsewhere classified: Secondary | ICD-10-CM | POA: Diagnosis not present

## 2017-08-28 DIAGNOSIS — R600 Localized edema: Secondary | ICD-10-CM | POA: Diagnosis not present

## 2017-08-28 DIAGNOSIS — J449 Chronic obstructive pulmonary disease, unspecified: Secondary | ICD-10-CM | POA: Diagnosis not present

## 2017-08-28 DIAGNOSIS — I4891 Unspecified atrial fibrillation: Secondary | ICD-10-CM | POA: Diagnosis not present

## 2017-08-28 DIAGNOSIS — E569 Vitamin deficiency, unspecified: Secondary | ICD-10-CM | POA: Diagnosis not present

## 2017-08-28 DIAGNOSIS — I5033 Acute on chronic diastolic (congestive) heart failure: Secondary | ICD-10-CM | POA: Diagnosis not present

## 2017-08-28 DIAGNOSIS — E119 Type 2 diabetes mellitus without complications: Secondary | ICD-10-CM | POA: Diagnosis not present

## 2017-08-28 DIAGNOSIS — J9611 Chronic respiratory failure with hypoxia: Secondary | ICD-10-CM | POA: Diagnosis not present

## 2017-08-28 DIAGNOSIS — M5010 Cervical disc disorder with radiculopathy, unspecified cervical region: Secondary | ICD-10-CM | POA: Diagnosis not present

## 2017-08-28 DIAGNOSIS — E876 Hypokalemia: Secondary | ICD-10-CM | POA: Diagnosis not present

## 2017-08-28 DIAGNOSIS — E1169 Type 2 diabetes mellitus with other specified complication: Secondary | ICD-10-CM | POA: Diagnosis not present

## 2017-08-28 DIAGNOSIS — I5030 Unspecified diastolic (congestive) heart failure: Secondary | ICD-10-CM | POA: Diagnosis not present

## 2017-08-28 LAB — BASIC METABOLIC PANEL
Anion gap: 13 (ref 5–15)
BUN: 25 mg/dL — ABNORMAL HIGH (ref 6–20)
CO2: 30 mmol/L (ref 22–32)
Calcium: 9.1 mg/dL (ref 8.9–10.3)
Chloride: 93 mmol/L — ABNORMAL LOW (ref 101–111)
Creatinine, Ser: 0.83 mg/dL (ref 0.44–1.00)
GFR calc Af Amer: >60 mL/min (ref >60)
GFR calc non Af Amer: >60 mL/min (ref >60)
Glucose, Bld: 275 mg/dL — ABNORMAL HIGH (ref 65–99)
Potassium: 3.9 mmol/L (ref 3.5–5.1)
Sodium: 136 mmol/L (ref 135–145)

## 2017-08-28 LAB — GLUCOSE, CAPILLARY
Glucose-Capillary: 239 mg/dL — ABNORMAL HIGH (ref 65–99)
Glucose-Capillary: 257 mg/dL — ABNORMAL HIGH (ref 65–99)
Glucose-Capillary: 211 mg/dL — ABNORMAL HIGH (ref 65–99)

## 2017-08-28 MED ORDER — PREMIER PROTEIN SHAKE: 11.0000 [oz_av] | Freq: Two times a day (BID) | ORAL | 0 refills | Status: DC

## 2017-08-28 MED ORDER — APIXABAN 5 MG PO TABS: 5.0000 mg | ORAL_TABLET | Freq: Two times a day (BID) | ORAL | 0 refills | Status: DC

## 2017-08-28 MED ORDER — PREDNISONE 20 MG PO TABS
30.0000 mg | ORAL_TABLET | Freq: Every day | ORAL | Status: DC
  Administered 2017-08-28: 30 mg via ORAL
  Filled 2017-08-28: qty 1

## 2017-08-28 MED ORDER — FUROSEMIDE 40 MG PO TABS: 40.0000 mg | ORAL_TABLET | Freq: Two times a day (BID) | ORAL | 0 refills | Status: DC

## 2017-08-28 MED ORDER — LEVOFLOXACIN 500 MG PO TABS: 500.0000 mg | ORAL_TABLET | Freq: Every day | ORAL | 0 refills | Status: DC

## 2017-08-28 MED ORDER — ALBUTEROL SULFATE (2.5 MG/3ML) 0.083% IN NEBU: 2.5000 mg | INHALATION_SOLUTION | Freq: Four times a day (QID) | RESPIRATORY_TRACT | 12 refills | Status: DC

## 2017-08-28 MED ORDER — POTASSIUM CHLORIDE CRYS ER 20 MEQ PO TBCR: 20.0000 meq | EXTENDED_RELEASE_TABLET | Freq: Every day | ORAL | 0 refills | Status: DC

## 2017-08-28 MED ORDER — OXYCODONE-ACETAMINOPHEN 5-325 MG PO TABS: 1.0000 | ORAL_TABLET | Freq: Three times a day (TID) | ORAL | 0 refills | Status: DC | PRN

## 2017-08-28 MED ORDER — FLUTICASONE-SALMETEROL 115-21 MCG/ACT IN AERO: 2.0000 | INHALATION_SPRAY | Freq: Two times a day (BID) | RESPIRATORY_TRACT | 0 refills | Status: DC

## 2017-08-28 MED ORDER — DIGOXIN 125 MCG PO TABS: 0.1250 mg | ORAL_TABLET | Freq: Every day | ORAL | 0 refills | Status: DC

## 2017-08-28 MED ORDER — METOPROLOL SUCCINATE ER 200 MG PO TB24: 200.0000 mg | ORAL_TABLET | Freq: Every day | ORAL | 0 refills | Status: DC

## 2017-08-28 MED ORDER — FUROSEMIDE 40 MG PO TABS
40.0000 mg | ORAL_TABLET | Freq: Two times a day (BID) | ORAL | Status: DC
  Administered 2017-08-28 (×2): 40 mg via ORAL
  Filled 2017-08-28 (×2): qty 1

## 2017-08-28 MED ORDER — LOSARTAN POTASSIUM 25 MG PO TABS: 25.0000 mg | ORAL_TABLET | Freq: Every day | ORAL | 0 refills | Status: DC

## 2017-08-28 MED ORDER — SPIRONOLACTONE 25 MG PO TABS: 12.5000 mg | ORAL_TABLET | Freq: Every day | ORAL | 0 refills | Status: DC

## 2017-08-28 MED ORDER — PREDNISONE 10 MG PO TABS: ORAL_TABLET | ORAL | 0 refills | Status: DC

## 2017-08-28 NOTE — Progress Notes (Signed)
Patient is medically stable for D/C to [facility] today. Per [name] authorization has been received and patient can come today to room 503. RN will call report to 500 hall nurse at [phone] and arrange EMS for transport. Clinical Education officer, museum (CSW) sent D/C orders to [facility]. Patient is aware of above. CSW contacted patient's daughter Debra Saunders and made her aware of above. CSW attempted to contact patient's daughter [name] however she did not answer and a voicemail was left. Please reconsult if future social work needs arise. CSW signing off.   [employer], LCSW [phone]

## 2017-08-28 NOTE — Plan of Care (Signed)
  Clinical Measurements: Diagnostic test results will improve 08/28/2017 0131 - Progressing by Marylouise Stacks, RN   Clinical Measurements: Respiratory complications will improve 08/28/2017 0131 - Progressing by Marylouise Stacks, RN   Clinical Measurements: Cardiovascular complication will be avoided 08/28/2017 0131 - Progressing by Marylouise Stacks, RN   Pain Managment: General experience of comfort will improve 08/28/2017 0131 - Progressing by Marylouise Stacks, RN

## 2017-08-28 NOTE — Telephone Encounter (Signed)
Patient being discharged today, Friday 08/28/17. Will need a TCM call on Monday 08/28/17.

## 2017-08-28 NOTE — Telephone Encounter (Signed)
[facility] for a/c chf afib rvr needs 2 wks  Scheduled next available 4-11 with Sharolyn Douglas added to waitlist

## 2017-08-28 NOTE — Progress Notes (Signed)
Report called to [facility] at [employer]. [facility] called for transportation.

## 2017-08-28 NOTE — Progress Notes (Signed)
[facility] called to verified pt is in the list for transport, Per EMS she is the fourth pt in line.

## 2017-08-28 NOTE — Progress Notes (Signed)
Debra Saunders to be D/C'd Skilled nursing facility([facility]) per MD order. Report given to [facility].  Discussed prescriptions and follow up appointments with the patient.  Pt verbalized understanding.  Allergies as of 08/28/2017      Reactions   Aspirin Hives, Other (See Comments)   Pt states that she is unable to take because she had bleeding in her brain.     Other Other (See Comments)   Pt states that she is unable to take blood thinners because she had bleeding in her brain.    Penicillins Hives, Shortness Of Breath, Swelling, Other (See Comments)   Has patient had a PCN reaction causing immediate rash, facial/tongue/throat swelling, SOB or lightheadedness with hypotension: Yes Has patient had a PCN reaction causing severe rash involving mucus membranes or skin necrosis: No Has patient had a PCN reaction that required hospitalization No Has patient had a PCN reaction occurring within the last 10 years: No If all of the above answers are "NO", then may proceed with Cephalosporin use.      Medication List    STOP taking these medications   CARTIA XT 240 MG 24 hr capsule Generic drug:  diltiazem   EASY TOUCH INSULIN SYRINGE 29G X 1/2" 1 ML Misc Generic drug:  INSULIN SYRINGE 1CC/29G   glucose blood test strip   hydrochlorothiazide 25 MG tablet Commonly known as:  HYDRODIURIL   Lancets Misc   meclizine 25 MG tablet Commonly known as:  ANTIVERT   metFORMIN 500 MG tablet Commonly known as:  GLUCOPHAGE   metoprolol tartrate 50 MG tablet Commonly known as:  LOPRESSOR   torsemide 20 MG tablet Commonly known as:  DEMADEX   TRUE METRIX AIR GLUCOSE METER w/Device Kit     TAKE these medications   acetaminophen 325 MG tablet Commonly known as:  TYLENOL Take 325-650 mg by mouth every 6 (six) hours as needed for mild pain. Reported on 10/10/2015   albuterol (2.5 MG/3ML) 0.083% nebulizer solution Commonly known as:  PROVENTIL Take 3 mLs (2.5 mg total) by  nebulization every 6 (six) hours. What changed:    when to take this  reasons to take this   apixaban 5 MG Tabs tablet Commonly known as:  ELIQUIS Take 1 tablet (5 mg total) by mouth 2 (two) times daily.   BIOFREEZE EX Apply 1 application topically as needed (for pain).   digoxin 0.125 MG tablet Commonly known as:  LANOXIN Take 1 tablet (0.125 mg total) by mouth daily. Start taking on:  08/29/2017   Dulaglutide 0.75 MG/0.5ML Sopn Commonly known as:  [name] Inject 4.48 mg into the skin once a week.   fluticasone-salmeterol 115-21 MCG/ACT inhaler Commonly known as:  ADVAIR HFA Inhale 2 puffs into the lungs 2 (two) times daily.   furosemide 40 MG tablet Commonly known as:  LASIX Take 1 tablet (40 mg total) by mouth 2 (two) times daily.   gabapentin 100 MG capsule Commonly known as:  NEURONTIN TAKE 2 CAPSULES BY MOUTH THREE TIMES DAILY   levofloxacin 500 MG tablet Commonly known as:  LEVAQUIN Take 1 tablet (500 mg total) by mouth daily. Notes to patient:  > Do NOT take within 2 hours of multivitamin or calcium tablets or Dairy >   losartan 25 MG tablet Commonly known as:  COZAAR Take 1 tablet (25 mg total) by mouth daily. Start taking on:  08/29/2017 What changed:    medication strength  how much to take   metoprolol 200 MG  24 hr tablet Commonly known as:  TOPROL-XL Take 1 tablet (200 mg total) by mouth daily. Take with or immediately following a meal.   ondansetron 8 MG tablet Commonly known as:  ZOFRAN Take 1 tablet (8 mg total) by mouth every 8 (eight) hours as needed for nausea or vomiting.   oxyCODONE-acetaminophen 5-325 MG tablet Commonly known as:  PERCOCET/ROXICET Take 1 tablet by mouth every 8 (eight) hours as needed for severe pain.   OXYGEN Inhale into the lungs. 3 liters   polyethylene glycol packet Commonly known as:  MIRALAX / GLYCOLAX Take 17 g by mouth daily as needed for moderate constipation.   potassium chloride SA 20 MEQ  tablet Commonly known as:  K-DUR,KLOR-CON Take 1 tablet (20 mEq total) by mouth daily. What changed:    how much to take  when to take this   predniSONE 10 MG tablet Commonly known as:  DELTASONE 2 tabs po day1; 1 tab po day2,3 then stop Start taking on:  08/29/2017   promethazine 25 MG tablet Commonly known as:  PHENERGAN Take 25 mg by mouth every 4 (four) hours as needed for nausea or vomiting. Reported on 10/10/2015   protein supplement shake Liqd Commonly known as:  PREMIER PROTEIN Take 325 mLs (11 oz total) by mouth 2 (two) times daily between meals.   rosuvastatin 10 MG tablet Commonly known as:  CRESTOR Take 1 tablet (10 mg total) by mouth daily.   sildenafil 20 MG tablet Commonly known as:  REVATIO Take 20 mg by mouth 3 (three) times daily.   spironolactone 25 MG tablet Commonly known as:  ALDACTONE Take 0.5 tablets (12.5 mg total) by mouth daily.       Vitals:   08/28/17 1412 08/28/17 1556  BP:  (!) 156/69  Pulse:  60  Resp:  18  Temp:  (!) 97.5 F (36.4 C)  SpO2: 98% 98%    Tele box removed and returned.Skin clean, dry and intact without evidence of skin break down, no evidence of skin tears noted. IV catheter discontinued intact. Site without signs and symptoms of complications. Dressing and pressure applied. Pt denies pain at this time. No complaints noted.  An After Visit Summary was printed and given to the patient. Patient escorted via stretcher, and D/C home via EMS  Rolley Sims

## 2017-08-28 NOTE — Discharge Summary (Signed)
[place] [facility] [place] NAME: Debra Saunders    MR#:  [id number]  DATE OF BIRTH:  08-Feb-1946  DATE OF ADMISSION:  08/16/2017 ADMITTING PHYSICIAN: Gorden Harms, MD  DATE OF DISCHARGE: 08/28/2017  PRIMARY CARE PHYSICIAN: [name], MD    ADMISSION DIAGNOSIS:  Atrial fibrillation with RVR ([place]) [I48.91] Chest pain, unspecified type [R07.9] Acute on chronic congestive heart failure, unspecified heart failure type ([place]) [I50.9]  DISCHARGE DIAGNOSIS:  Active Problems:   Acute respiratory failure with hypoxia and hypercapnia ([place])   SECONDARY DIAGNOSIS:   Past Medical History:  Diagnosis Date  . Arthritis   . Atrial fibrillation, persistent ([place])   . Chest pain    a. 01/2012 Cath: nonobs dzs;  c. 04/2014 MV: No ischemia.  . Chronic back pain   . Chronic diastolic CHF (congestive heart failure) ([place])    a. Dx 2005 @ [facility];  b. 02/2014 Echo: Ef 55-60%, no rwma, mild MR, PASP 75mHg.  .Marland KitchenChronic respiratory failure (HCC)    a. on home O2  . COPD (chronic obstructive pulmonary disease) ([place]    a. Home O2 - 3lpm  . Gastritis   . History of intracranial hemorrhage    6/14 described by neurosurgery as "small frontal posttraumatic SAH "  . Hyperlipidemia   . Hypertension   . Lipoma of back   . Lymphedema    a. Chronic LLE edema.  . Obesity   . Sepsis with metabolic encephalopathy ([facility]   . Sleep apnea   . Streptococcal bacteremia   . Stroke ([place]   . Symptomatic bradycardia    a. s/p BSX PPM.  . Thyroid disorder   . Thyroid nodule   . Type II diabetes mellitus ([facility]   . Urinary incontinence     HOSPITAL COURSE:   1.  Acute on chronic  hypoxic hypercapnic respiratory failure.  Patient initially required BiPAP.  Now back on 4 L chronic oxygen. 2.  Acute on chronic  systolic congestive heart failure.  The patient was diuresed initially and then her water pill was held.  Then placed on p.o. torsemide.  I had to place the patient  back on IV Lasix for further diuresis.  Now switched over to p.o. Lasix 40 mg p.o. twice daily.  Hopefully can taper down to 40 mg daily as outpatient.  Recommend checking a BMP weekly.  Patient on Toprol, Cozaar, Spironolactone and Lasix.  Follow-up at CHF clinic.  Daily weights. 3.  COPD exacerbation.  Patient was on Solu-Medrol and Levaquin.  Switch over to prednisone quick taper.  Continue nebulizers.  Add Advair. 4.  Atrial fibrillation with rapid ventricular response.   the patient had a cardioversion.  The patient is on metoprolol digoxin and Eliquis.  The patient will need to be on Eliquis for at least a month after cardioversion.  Follow-up with cardiology as outpatient.  The patient is currently in normal sinus rhythm. 5.  Generalized weakness.  Physical therapy recommends rehab. 6.  Hyperlipidemia unspecified on Crestor  7.  Essential hypertension on metoprolol 8.  Pulmonary hypertension on Revatio 9.  Morbid obesity.  Weight loss needed.  Recommend outpatient sleep study 10.  Type 2 diabetes.  On [name] once a week.  Check fingersticks q. before meals.  Hopefully once off steroids sugars will be better.    DISCHARGE CONDITIONS:   Satisfactory  CONSULTS OBTAINED:  Cardiology  DRUG ALLERGIES:   Allergies  Allergen Reactions  . Aspirin Hives  and Other (See Comments)    Pt states that she is unable to take because she had bleeding in her brain.    . Other Other (See Comments)    Pt states that she is unable to take blood thinners because she had bleeding in her brain.   [name] Penicillins Hives, Shortness Of Breath, Swelling and Other (See Comments)    Has patient had a PCN reaction causing immediate rash, facial/tongue/throat swelling, SOB or lightheadedness with hypotension: Yes Has patient had a PCN reaction causing severe rash involving mucus membranes or skin necrosis: No Has patient had a PCN reaction that required hospitalization No Has patient had a PCN reaction occurring  within the last 10 years: No If all of the above answers are "NO", then may proceed with Cephalosporin use.    DISCHARGE MEDICATIONS:   Allergies as of 08/28/2017      Reactions   Aspirin Hives, Other (See Comments)   Pt states that she is unable to take because she had bleeding in her brain.     Other Other (See Comments)   Pt states that she is unable to take blood thinners because she had bleeding in her brain.    Penicillins Hives, Shortness Of Breath, Swelling, Other (See Comments)   Has patient had a PCN reaction causing immediate rash, facial/tongue/throat swelling, SOB or lightheadedness with hypotension: Yes Has patient had a PCN reaction causing severe rash involving mucus membranes or skin necrosis: No Has patient had a PCN reaction that required hospitalization No Has patient had a PCN reaction occurring within the last 10 years: No If all of the above answers are "NO", then may proceed with Cephalosporin use.      Medication List    STOP taking these medications   CARTIA XT 240 MG 24 hr capsule Generic drug:  diltiazem   EASY TOUCH INSULIN SYRINGE 29G X 1/2" 1 ML Misc Generic drug:  INSULIN SYRINGE 1CC/29G   glucose blood test strip   hydrochlorothiazide 25 MG tablet Commonly known as:  HYDRODIURIL   Lancets Misc   meclizine 25 MG tablet Commonly known as:  ANTIVERT   metFORMIN 500 MG tablet Commonly known as:  GLUCOPHAGE   metoprolol tartrate 50 MG tablet Commonly known as:  LOPRESSOR   torsemide 20 MG tablet Commonly known as:  DEMADEX   TRUE METRIX AIR GLUCOSE METER w/Device Kit     TAKE these medications   acetaminophen 325 MG tablet Commonly known as:  TYLENOL Take 325-650 mg by mouth every 6 (six) hours as needed for mild pain. Reported on 10/10/2015   albuterol (2.5 MG/3ML) 0.083% nebulizer solution Commonly known as:  PROVENTIL Take 3 mLs (2.5 mg total) by nebulization every 6 (six) hours. What changed:    when to take this  reasons to  take this   apixaban 5 MG Tabs tablet Commonly known as:  ELIQUIS Take 1 tablet (5 mg total) by mouth 2 (two) times daily.   BIOFREEZE EX Apply 1 application topically as needed (for pain).   digoxin 0.125 MG tablet Commonly known as:  LANOXIN Take 1 tablet (0.125 mg total) by mouth daily. Start taking on:  08/29/2017   Dulaglutide 0.75 MG/0.5ML Sopn Commonly known as:  [name] Inject 0.93 mg into the skin once a week.   fluticasone-salmeterol 115-21 MCG/ACT inhaler Commonly known as:  ADVAIR HFA Inhale 2 puffs into the lungs 2 (two) times daily.   furosemide 40 MG tablet Commonly known as:  LASIX Take 1  tablet (40 mg total) by mouth 2 (two) times daily.   gabapentin 100 MG capsule Commonly known as:  NEURONTIN TAKE 2 CAPSULES BY MOUTH THREE TIMES DAILY   levofloxacin 500 MG tablet Commonly known as:  LEVAQUIN Take 1 tablet (500 mg total) by mouth daily.   losartan 25 MG tablet Commonly known as:  COZAAR Take 1 tablet (25 mg total) by mouth daily. Start taking on:  08/29/2017 What changed:    medication strength  how much to take   metoprolol 200 MG 24 hr tablet Commonly known as:  TOPROL-XL Take 1 tablet (200 mg total) by mouth daily. Take with or immediately following a meal.   ondansetron 8 MG tablet Commonly known as:  ZOFRAN Take 1 tablet (8 mg total) by mouth every 8 (eight) hours as needed for nausea or vomiting.   oxyCODONE-acetaminophen 5-325 MG tablet Commonly known as:  PERCOCET/ROXICET Take 1 tablet by mouth every 8 (eight) hours as needed for severe pain.   OXYGEN Inhale into the lungs. 3 liters   polyethylene glycol packet Commonly known as:  MIRALAX / GLYCOLAX Take 17 g by mouth daily as needed for moderate constipation.   potassium chloride SA 20 MEQ tablet Commonly known as:  K-DUR,KLOR-CON Take 1 tablet (20 mEq total) by mouth daily. What changed:    how much to take  when to take this   predniSONE 10 MG tablet Commonly known  as:  DELTASONE 2 tabs po day1; 1 tab po day2,3 then stop Start taking on:  08/29/2017   promethazine 25 MG tablet Commonly known as:  PHENERGAN Take 25 mg by mouth every 4 (four) hours as needed for nausea or vomiting. Reported on 10/10/2015   protein supplement shake Liqd Commonly known as:  PREMIER PROTEIN Take 325 mLs (11 oz total) by mouth 2 (two) times daily between meals.   rosuvastatin 10 MG tablet Commonly known as:  CRESTOR Take 1 tablet (10 mg total) by mouth daily.   sildenafil 20 MG tablet Commonly known as:  REVATIO Take 20 mg by mouth 3 (three) times daily.   spironolactone 25 MG tablet Commonly known as:  ALDACTONE Take 0.5 tablets (12.5 mg total) by mouth daily.        DISCHARGE INSTRUCTIONS:   Follow-up with team at rehab Follow-up with cardiology 2 weeks presented with shortness of breath  If you experience worsening of your admission symptoms, develop shortness of breath, life threatening emergency, suicidal or homicidal thoughts you must seek medical attention immediately by calling 911 or calling your MD immediately  if symptoms less severe.  You Must read complete instructions/literature along with all the possible adverse reactions/side effects for all the Medicines you take and that have been prescribed to you. Take any new Medicines after you have completely understood and accept all the possible adverse reactions/side effects.   Please note  You were cared for by a hospitalist during your hospital stay. If you have any questions about your discharge medications or the care you received while you were in the hospital after you are discharged, you can call the unit and asked to speak with the hospitalist on call if the hospitalist that took care of you is not available. Once you are discharged, your primary care physician will handle any further medical issues. Please note that NO REFILLS for any discharge medications will be authorized once you are  discharged, as it is imperative that you return to your primary care physician (or establish a relationship  with a primary care physician if you do not have one) for your aftercare needs so that they can reassess your need for medications and monitor your lab values.    Today   CHIEF COMPLAINT:   Chief Complaint  Patient presents with  . Shortness of Breath    HISTORY OF PRESENT ILLNESS:  Jakhiya Brower  is a 72 y.o. female presents with shortness of breath   VITAL SIGNS:  Blood pressure (!) 159/64, pulse 76, temperature 98.5 F (36.9 C), temperature source Oral, resp. rate 18, height '5\' 1"'  (1.549 m), weight 99.5 kg (219 lb 4.8 oz), SpO2 96 %.    PHYSICAL EXAMINATION:  GENERAL:  72 y.o.-year-old patient lying in the bed with no acute distress.  EYES: Pupils equal, round, reactive to light and accommodation. No scleral icterus. Extraocular muscles intact.  HEENT: Head atraumatic, normocephalic. Oropharynx and nasopharynx clear.  NECK:  Supple, no jugular venous distention. No thyroid enlargement, no tenderness.  LUNGS: Decreased breath sounds bilaterally, no wheezing, rales,rhonchi or crepitation. No use of accessory muscles of respiration.  CARDIOVASCULAR: S1, S2 normal. No murmurs, rubs, or gallops.  ABDOMEN: Soft, non-tender, non-distended. Bowel sounds present. No organomegaly or mass.  EXTREMITIES: Left lower extremity 2+ edema which is chronic, right lower externally 1+ edema. no cyanosis, or clubbing.  NEUROLOGIC: Cranial nerves II through XII are intact. Muscle strength 5/5 in all extremities. Sensation intact. Gait not checked.  PSYCHIATRIC: The patient is alert and oriented x 3.  SKIN: No obvious rash, lesion, or ulcer.   DATA REVIEW:   CBC Recent Labs  Lab 08/25/17 0456  WBC 10.3  HGB 10.5*  HCT 33.5*  PLT 205    Chemistries  Recent Labs  Lab 08/25/17 1549 08/28/17 0432  NA  --  136  K  --  3.9  CL  --  93*  CO2  --  30  GLUCOSE  --  275*  BUN  --   25*  CREATININE  --  0.83  CALCIUM  --  9.1  MG 1.7  --      Microbiology Results  Results for orders placed or performed during the hospital encounter of 08/16/17  MRSA PCR Screening     Status: Abnormal   Collection Time: 08/16/17  1:39 PM  Result Value Ref Range Status   MRSA by PCR POSITIVE (A) NEGATIVE Final    Comment:        [name] (FDA approved for NASAL specimens only), is one component of a comprehensive MRSA colonization surveillance program. It is not intended to diagnose MRSA infection nor to guide or monitor treatment for MRSA infections. RESULT CALLED TO, READ BACK BY AND VERIFIED WITH: BRITTNEY RUDD @ 7654 ON 08/16/2017 BY CAF Performed at [facility], [address] [place], [place] [address]   Culture, blood (routine x 2)     Status: None   Collection Time: 08/16/17  4:49 PM  Result Value Ref Range Status   Specimen Description BLOOD LEFT ANTECUBITAL  Final   Special Requests   Final    BOTTLES DRAWN AEROBIC AND ANAEROBIC Blood Culture results may not be optimal due to an inadequate volume of blood received in culture bottles   Culture   Final    NO GROWTH 5 DAYS Performed at [facility], [address], [place], [place] [address]    Report Status 08/21/2017 FINAL  Final  Culture, blood (routine x 2)     Status: None  Collection Time: 08/16/17  4:49 PM  Result Value Ref Range Status   Specimen Description BLOOD LEFT ANTECUBITAL  Final   Special Requests   Final    BOTTLES DRAWN AEROBIC AND ANAEROBIC Blood Culture results may not be optimal due to an inadequate volume of blood received in culture bottles   Culture   Final    NO GROWTH 5 DAYS Performed at [facility], [address] [place], [place] [address]    Report Status 08/21/2017 FINAL  Final  Urine Culture     Status: Abnormal   Collection Time: 08/24/17  4:34 PM  Result Value Ref Range Status   Specimen Description   Final    URINE,  RANDOM Performed at [facility], [address], [place], [place] [address]    Special Requests   Final    NONE Performed at [facility], [address] [place], [place] [address]    Culture MULTIPLE SPECIES PRESENT, SUGGEST RECOLLECTION (A)  Final   Report Status 08/26/2017 FINAL  Final     Management plans discussed with the patient, family and they are in agreement.  CODE STATUS:     Code Status Orders  (From admission, onward)        Start     Ordered   08/16/17 1614  Full code  Continuous     08/16/17 1613    Code Status History    Date Active Date Inactive Code Status Order ID Comments User Context   03/30/2015 21:52 04/03/2015 21:22 Full Code [id number]  Etta Quill, DO Inpatient   03/27/2015 22:08 03/28/2015 17:37 Full Code [id number]  Nicholes Mango, MD ED   01/09/2014 14:18 01/10/2014 18:03 Full Code [id number]  Deboraha Sprang, MD Inpatient   01/16/2013 23:35 01/27/2013 20:11 Full Code [id number]  Collene Gobble, MD Inpatient      TOTAL TIME TAKING CARE OF THIS PATIENT: 38 minutes.    Loletha Grayer M.D on 08/28/2017 at 9:14 AM  Between 7am to 6pm - Pager - [phone]  After 6pm go to [email or web address] - password [email or web address]  [facility] [employer] Office  [phone]  CC: Primary care physician; [name], MD

## 2017-08-28 NOTE — Clinical Social Work Placement (Signed)
   CLINICAL SOCIAL WORK PLACEMENT  NOTE  Date:  08/28/2017  Patient Details  Name: Debra Saunders MRN: [id number] Date of Birth: [birth year]  Clinical Social Work is seeking post-discharge placement for this patient at the [place] level of care (*CSW will initial, date and re-position this form in  chart as items are completed):  Yes   Patient/family provided with [facility] Work Department's list of facilities offering this level of care within the geographic area requested by the patient (or if unable, by the patient's family).  Yes   Patient/family informed of their freedom to choose among providers that offer the needed level of care, that participate in Medicare, Medicaid or managed care program needed by the patient, have an available bed and are willing to accept the patient.  Yes   Patient/family informed of 's ownership interest in [facility] and [facility], as well as of the fact that they are under no obligation to receive care at these facilities.  PASRR submitted to EDS on       PASRR number received on       Existing PASRR number confirmed on 08/23/17     FL2 transmitted to all facilities in geographic area requested by pt/family on 08/23/17     FL2 transmitted to all facilities within larger geographic area on       Patient informed that his/her managed care company has contracts with or will negotiate with certain facilities, including the following:        Yes   Patient/family informed of bed offers received.  [facility] bed at ([facility]     Physician recommends and patient chooses bed at      Patient to be transferred to ([facility] ) on 08/28/17.  Patient to be transferred to facility by [facility] )     Patient family notified on 08/28/17 of transfer.  Name of family member notified:  (Patient's daughter [name] is aware of D/C today. )     PHYSICIAN       Additional Comment:     _______________________________________________ Andrius Andrepont, Veronia Beets, LCSW 08/28/2017, 9:42 AM

## 2017-08-31 NOTE — Telephone Encounter (Signed)
No answer. Mailbox full. 

## 2017-09-01 DIAGNOSIS — J449 Chronic obstructive pulmonary disease, unspecified: Secondary | ICD-10-CM | POA: Diagnosis not present

## 2017-09-01 DIAGNOSIS — I4891 Unspecified atrial fibrillation: Secondary | ICD-10-CM | POA: Diagnosis not present

## 2017-09-01 DIAGNOSIS — I10 Essential (primary) hypertension: Secondary | ICD-10-CM | POA: Diagnosis not present

## 2017-09-01 DIAGNOSIS — I509 Heart failure, unspecified: Secondary | ICD-10-CM | POA: Diagnosis not present

## 2017-09-01 DIAGNOSIS — J9601 Acute respiratory failure with hypoxia: Secondary | ICD-10-CM | POA: Diagnosis not present

## 2017-09-01 DIAGNOSIS — E119 Type 2 diabetes mellitus without complications: Secondary | ICD-10-CM | POA: Diagnosis not present

## 2017-09-02 NOTE — Telephone Encounter (Signed)
No answer. Mailbox memory is full.

## 2017-09-03 NOTE — Telephone Encounter (Signed)
Patient's daughter, Carolin Coy ([place]), contacted regarding discharge from [facility] on 08/28/17.   Patient's daughter understands to follow up with provider ? On 10/01/17 at 0800AM at [facility].  Patient's daughter understands discharge instructions? Yes  Patient's daughter understands medications and regiment? Yes Patient's daughter understands to bring all medications to this visit? Yes Patient currently in a rehab facility.

## 2017-09-06 DIAGNOSIS — I509 Heart failure, unspecified: Secondary | ICD-10-CM | POA: Diagnosis not present

## 2017-09-06 DIAGNOSIS — I4891 Unspecified atrial fibrillation: Secondary | ICD-10-CM | POA: Diagnosis not present

## 2017-09-06 DIAGNOSIS — J441 Chronic obstructive pulmonary disease with (acute) exacerbation: Secondary | ICD-10-CM | POA: Diagnosis not present

## 2017-09-06 DIAGNOSIS — I890 Lymphedema, not elsewhere classified: Secondary | ICD-10-CM | POA: Diagnosis not present

## 2017-09-06 DIAGNOSIS — Z9981 Dependence on supplemental oxygen: Secondary | ICD-10-CM | POA: Diagnosis not present

## 2017-09-06 DIAGNOSIS — R600 Localized edema: Secondary | ICD-10-CM | POA: Diagnosis not present

## 2017-09-08 ENCOUNTER — Ambulatory Visit: Payer: Medicare HMO | Admitting: Family

## 2017-09-08 ENCOUNTER — Telehealth: Payer: Self-pay | Admitting: Family Medicine

## 2017-09-08 DIAGNOSIS — J9622 Acute and chronic respiratory failure with hypercapnia: Secondary | ICD-10-CM | POA: Diagnosis not present

## 2017-09-08 DIAGNOSIS — I481 Persistent atrial fibrillation: Secondary | ICD-10-CM | POA: Diagnosis not present

## 2017-09-08 DIAGNOSIS — I5023 Acute on chronic systolic (congestive) heart failure: Secondary | ICD-10-CM | POA: Diagnosis not present

## 2017-09-08 DIAGNOSIS — I5032 Chronic diastolic (congestive) heart failure: Secondary | ICD-10-CM | POA: Diagnosis not present

## 2017-09-08 DIAGNOSIS — I272 Pulmonary hypertension, unspecified: Secondary | ICD-10-CM | POA: Diagnosis not present

## 2017-09-08 DIAGNOSIS — E119 Type 2 diabetes mellitus without complications: Secondary | ICD-10-CM | POA: Diagnosis not present

## 2017-09-08 DIAGNOSIS — J441 Chronic obstructive pulmonary disease with (acute) exacerbation: Secondary | ICD-10-CM | POA: Diagnosis not present

## 2017-09-08 DIAGNOSIS — J9621 Acute and chronic respiratory failure with hypoxia: Secondary | ICD-10-CM | POA: Diagnosis not present

## 2017-09-08 DIAGNOSIS — I110 Hypertensive heart disease with heart failure: Secondary | ICD-10-CM | POA: Diagnosis not present

## 2017-09-08 NOTE — Telephone Encounter (Signed)
Please advise 

## 2017-09-08 NOTE — Telephone Encounter (Signed)
Copied from [place]. Topic: General - Other >> Sep 08, 2017  3:11 PM Cecelia Byars, NT wrote: Reason for CRM: [facility] admitted [facility] patient into their care today the plan is to see her 2 x times a week for 2 week 1 x a weeks for 3 weeks , 1 time every  other week for 3 weeks , this is for COPD, and CHF , she was started on eliquis in the last  month and within the last week she has black tarry stools with blood ,she is also dizzy upon standing please call [phone]

## 2017-09-08 NOTE — Telephone Encounter (Signed)
Yes it is okay to proceed with that plan of care as outlined in the message previously.  You can give verbal orders.  Please follow-up tomorrow to make sure the patient was evaluated.  Thanks.

## 2017-09-08 NOTE — Telephone Encounter (Signed)
Noted and agree. Please follow-up with patient tomorrow to ensure she got looked at. Thanks.

## 2017-09-08 NOTE — Telephone Encounter (Signed)
Debra Saunders with [facility] needs to know if it was ok to go with their plan of care. CB#: [phone]

## 2017-09-08 NOTE — Telephone Encounter (Signed)
FYI

## 2017-09-08 NOTE — Telephone Encounter (Signed)
Per Amy [facility], the patient was at [facility] from 08/[facility]/17 until today and the patient had bloody stools and black tarry stools several days at [facility], she did inform nurses about this but they did not seem concerned. Patient hemoglobin was 10.5 on 08/25/17 and 10.8 on 09/02/17 and her hematocrit was 35 on 09/02/17 and 33.5 on 08/25/17. Patient has had dizziness when she stands up. Amy did orthostatics and she was negative for orthostatics. Patient was on blood thinners on 2014 on Xarelto and was stopped due to a brain bleed. Last bowel movement was 2 nights ago and was black and tarry.Informed her that the patient will need to be evaluated today. Amy states she will call the patient and inform her to be evaluated today at urgent care or ER.

## 2017-09-09 DIAGNOSIS — J9621 Acute and chronic respiratory failure with hypoxia: Secondary | ICD-10-CM | POA: Diagnosis not present

## 2017-09-09 DIAGNOSIS — I5032 Chronic diastolic (congestive) heart failure: Secondary | ICD-10-CM | POA: Diagnosis not present

## 2017-09-09 DIAGNOSIS — J441 Chronic obstructive pulmonary disease with (acute) exacerbation: Secondary | ICD-10-CM | POA: Diagnosis not present

## 2017-09-09 DIAGNOSIS — I5023 Acute on chronic systolic (congestive) heart failure: Secondary | ICD-10-CM | POA: Diagnosis not present

## 2017-09-09 DIAGNOSIS — I272 Pulmonary hypertension, unspecified: Secondary | ICD-10-CM | POA: Diagnosis not present

## 2017-09-09 DIAGNOSIS — I110 Hypertensive heart disease with heart failure: Secondary | ICD-10-CM | POA: Diagnosis not present

## 2017-09-09 DIAGNOSIS — I481 Persistent atrial fibrillation: Secondary | ICD-10-CM | POA: Diagnosis not present

## 2017-09-09 DIAGNOSIS — E119 Type 2 diabetes mellitus without complications: Secondary | ICD-10-CM | POA: Diagnosis not present

## 2017-09-09 DIAGNOSIS — J9622 Acute and chronic respiratory failure with hypercapnia: Secondary | ICD-10-CM | POA: Diagnosis not present

## 2017-09-09 NOTE — Telephone Encounter (Signed)
Verbal orders given. FYI

## 2017-09-09 NOTE — Telephone Encounter (Signed)
Did she get evaluated? With her prior symptoms I would recommend her being evaluated sooner than we can get her in to the office. Thanks.

## 2017-09-09 NOTE — Telephone Encounter (Signed)
Please advise 

## 2017-09-09 NOTE — Telephone Encounter (Signed)
She did not get evaluated, informed amy that even though she is not having symptoms today she will still need to be evaluated. She states she will let her know

## 2017-09-09 NOTE — Telephone Encounter (Signed)
Debra Saunders calling back, patient had normal bowel movement this morning, patient will call to schedule appt. Still needs verbal orders from first call, request listed below. Call back [phone]

## 2017-09-10 ENCOUNTER — Other Ambulatory Visit: Payer: Self-pay

## 2017-09-10 ENCOUNTER — Telehealth: Payer: Self-pay | Admitting: Family Medicine

## 2017-09-10 DIAGNOSIS — J9622 Acute and chronic respiratory failure with hypercapnia: Secondary | ICD-10-CM | POA: Diagnosis not present

## 2017-09-10 DIAGNOSIS — I272 Pulmonary hypertension, unspecified: Secondary | ICD-10-CM | POA: Diagnosis not present

## 2017-09-10 DIAGNOSIS — I5032 Chronic diastolic (congestive) heart failure: Secondary | ICD-10-CM | POA: Diagnosis not present

## 2017-09-10 DIAGNOSIS — J441 Chronic obstructive pulmonary disease with (acute) exacerbation: Secondary | ICD-10-CM | POA: Diagnosis not present

## 2017-09-10 DIAGNOSIS — J9621 Acute and chronic respiratory failure with hypoxia: Secondary | ICD-10-CM | POA: Diagnosis not present

## 2017-09-10 DIAGNOSIS — I5023 Acute on chronic systolic (congestive) heart failure: Secondary | ICD-10-CM | POA: Diagnosis not present

## 2017-09-10 DIAGNOSIS — E119 Type 2 diabetes mellitus without complications: Secondary | ICD-10-CM | POA: Diagnosis not present

## 2017-09-10 DIAGNOSIS — I481 Persistent atrial fibrillation: Secondary | ICD-10-CM | POA: Diagnosis not present

## 2017-09-10 DIAGNOSIS — I110 Hypertensive heart disease with heart failure: Secondary | ICD-10-CM | POA: Diagnosis not present

## 2017-09-10 NOTE — Patient Outreach (Signed)
[facility]) Care Management  09/10/2017  Debra Saunders 12/19/1945 [id number]     Transition of Care Referral  Referral Date: 09/10/17 Referral Source: [facility] Discharge Report Date of Admission: unknown Diagnosis: unknown Date of Discharge: 09/07/17 Facility: [facility] Insurance: [employer]    Outreach attempt # 1 to patient. Spoke with patient who voices she is doing fairly well since return home. She reports that [facility] RN just left the home from seeing her. She denies any new and/or worsening issues. She stets that her dtr is the one that makes her appts. Noted in chart PCP appt for 10/07/17. RN CM encouraged patient to get into office to be seen sooner as it is advised to follow up within two weeks of discharge. She voices that she has to make her appts around her dtr's schedule due to transportation. Offered transportation assistance to patient but she politely declined and said "they are already working on it." She reports that she has all her meds. She denies any issues or concerns regarding them and states that [facility] is helping hr with her meds. Patient voices no further RN CM needs or concerns at this time. She was appreciative of call but does not feel like she needs any services at this time.     Plan: RN CM will close case.    Enzo [place], RN,BSN,CCM [facility] Telephonic Care Management Coordinator Direct Phone: [phone] Toll Free: [phone] Fax: [phone]

## 2017-09-10 NOTE — Telephone Encounter (Unsigned)
Copied from [place] [id number]. Topic: Quick Communication - See Telephone Encounter >> Sep 10, 2017  3:24 PM Neva Seat wrote: Sherlynn Stalls- OT [phone]  OT - evaluation done on 09-09-17.  Sherlynn Stalls, OT - spoke w/ pt yesterday. Pt didn't take her morning medications. She stated she was taking the day off.  Needing Verbal Order: 1 week 1 2 week 2

## 2017-09-10 NOTE — Telephone Encounter (Signed)
Please advise 

## 2017-09-11 DIAGNOSIS — E119 Type 2 diabetes mellitus without complications: Secondary | ICD-10-CM | POA: Diagnosis not present

## 2017-09-11 DIAGNOSIS — J9621 Acute and chronic respiratory failure with hypoxia: Secondary | ICD-10-CM | POA: Diagnosis not present

## 2017-09-11 DIAGNOSIS — I272 Pulmonary hypertension, unspecified: Secondary | ICD-10-CM | POA: Diagnosis not present

## 2017-09-11 DIAGNOSIS — J9622 Acute and chronic respiratory failure with hypercapnia: Secondary | ICD-10-CM | POA: Diagnosis not present

## 2017-09-11 DIAGNOSIS — I5023 Acute on chronic systolic (congestive) heart failure: Secondary | ICD-10-CM | POA: Diagnosis not present

## 2017-09-11 DIAGNOSIS — I110 Hypertensive heart disease with heart failure: Secondary | ICD-10-CM | POA: Diagnosis not present

## 2017-09-11 DIAGNOSIS — J441 Chronic obstructive pulmonary disease with (acute) exacerbation: Secondary | ICD-10-CM | POA: Diagnosis not present

## 2017-09-11 DIAGNOSIS — I5032 Chronic diastolic (congestive) heart failure: Secondary | ICD-10-CM | POA: Diagnosis not present

## 2017-09-11 DIAGNOSIS — I481 Persistent atrial fibrillation: Secondary | ICD-10-CM | POA: Diagnosis not present

## 2017-09-11 NOTE — Telephone Encounter (Signed)
Left voicemail with verbal orders  

## 2017-09-11 NOTE — Telephone Encounter (Signed)
Please advise 

## 2017-09-11 NOTE — Telephone Encounter (Signed)
Eather -OT with [facility] is calling to check the status of the verbal orders that she has requested. Stated that she has an appointment with the patient today. If someone could give her a call back at [phone]

## 2017-09-11 NOTE — Telephone Encounter (Signed)
Verbal orders can be given. 

## 2017-09-12 NOTE — Progress Notes (Addendum)
Cardiology Office Note    Date:  09/14/2017   ID:  Debra Saunders, DOB [birth year], MRN [id number]  PCP:  [name], MD  Cardiologist: [name], MD   EP: Dr. Caryl Comes  Chief Complaint  Patient presents with  . other    [place] follow up; CHF & A-Fib. Meds reviewed by the pt. verbally. Pt. c/o feels bloated, shortness of breath and LE edema.       History of Present Illness:    Debra Saunders is a 72 y.o. 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  She was recently admitted to [facility] and 07/2017 for worsening dyspnea on exertion and right-sided chest pain.  She was found to be in new-onset atrial fibrillation with RVR with heart rate in the 150's. Cardiology was consulted and she was started on IV Amiodarone. She was also started on Eliquis for anticoagulation and underwent a TEE guided DCCV on 08/20/2017 with successful return to normal sinus rhythm. She did develop recurrent atrial fibrillation on 3/3 but spontaneously converted back to normal sinus rhythm. Her EF was found to be reduced to 30-35% which was thought to be possibly secondary to her tachycardia. She was started on Toprol-XL, Losartan, and Spironolactone with plans for a repeat echo in 3 months to reassess her EF.  In talking with the patient today, she reports feeling "terrible" over the past week. She had initially been discharged to SNF but has since returned home. Since then, she reports worsening shortness of breath and weakness over the past week. She has been using 4 L nasal cannula at baseline. Has noted orthopnea and worsening lower extremity edema as well. She was initially discharged from the hospital  on Torsemide 20 mg daily but says she switched back to Lasix 40 mg twice daily due to to frequent urination with Torsemide.  She denies any recent chest pain. Does have occasional palpitations and feels like her heart "skips beats". Unsure of tachy-palpitations. She reports compliance with Eliquis and denies any evidence of active bleeding with this.   Past Medical History:  Diagnosis Date  . Arthritis   . Atrial fibrillation, persistent ([place])    a. s/p TEE-guided DCCV on 08/20/2017  . Chest pain    a. 01/2012 Cath: nonobs dzs;  c. 04/2014 MV: No ischemia.  . Chronic back pain   . Chronic combined systolic (congestive) and diastolic (congestive) heart failure ([place])    a. Dx 2005 @ [facility];  b. 02/2014 Echo: Ef 55-60%, no rwma, mild MR, PASP 70mmHg. c. 07/2017: EF 30-35%, diffuse HK, trivial AI, severe MR, moderate TR  . Chronic respiratory failure (HCC)    a. on home O2  . COPD (chronic obstructive pulmonary disease) ([place])    a. Home O2 - 3lpm  . Gastritis   . History of intracranial hemorrhage    6/14 described by neurosurgery as "small frontal posttraumatic SAH "  . Hyperlipidemia   . Hypertension   . Lipoma of back   . Lymphedema    a. Chronic LLE edema.  . Obesity   . Sepsis with metabolic encephalopathy ([place])   . Sleep apnea   . Streptococcal bacteremia   . Stroke ([place])   . Symptomatic bradycardia  a. s/p BSX PPM.  . Thyroid disorder   . Thyroid nodule   . Type II diabetes mellitus ([place])   . Urinary incontinence     Past Surgical History:  Procedure Laterality Date  . ABDOMINAL HYSTERECTOMY  1969  . CARDIAC CATHETERIZATION  2013   @ [place]: No obstructive CAD: Only 20% ostial left circumflex.  . CHOLECYSTECTOMY    . CYST REMOVAL TRUNK  2002   BACK  . INSERT / REPLACE / REMOVE PACEMAKER    . lipoma removal  2014   back  . PACEMAKER INSERTION  [address] [employer] dual chamber pacemaker implanted by Dr Caryl Comes for symptomatic bradycardia  . PERMANENT PACEMAKER  INSERTION N/A 01/09/2014   Procedure: PERMANENT PACEMAKER INSERTION;  Surgeon: Deboraha Sprang, MD;  Location: [facility] CATH LAB;  Service: Cardiovascular;  Laterality: N/A;  . TEE WITHOUT CARDIOVERSION N/A 08/20/2017   Procedure: TRANSESOPHAGEAL ECHOCARDIOGRAM (TEE) & Direct current cardioversion;  Surgeon: Minna Merritts, MD;  Location: [facility] ORS;  Service: Cardiovascular;  Laterality: N/A;  . TRIGGER FINGER RELEASE    . VAGINAL HYSTERECTOMY      Current Medications: Outpatient Medications Prior to Visit  Medication Sig Dispense Refill  . acetaminophen (TYLENOL) 325 MG tablet Take 325-650 mg by mouth every 6 (six) hours as needed for mild pain. Reported on 10/10/2015    . albuterol (PROVENTIL) (2.5 MG/3ML) 0.083% nebulizer solution Take 3 mLs (2.5 mg total) by nebulization every 6 (six) hours. 75 mL 12  . apixaban (ELIQUIS) 5 MG TABS tablet Take 1 tablet (5 mg total) by mouth 2 (two) times daily. 60 tablet 0  . digoxin (LANOXIN) 0.125 MG tablet Take 1 tablet (0.125 mg total) by mouth daily. 30 tablet 0  . Dulaglutide ([name]) 3.23 FT/7.3UK SOPN Inject 0.75 mg into the skin once a week. 2 pen 0  . gabapentin (NEURONTIN) 100 MG capsule TAKE 2 CAPSULES BY MOUTH THREE TIMES DAILY 180 capsule 3  . losartan (COZAAR) 25 MG tablet Take 1 tablet (25 mg total) by mouth daily. 30 tablet 0  . Menthol, Topical Analgesic, (BIOFREEZE EX) Apply 1 application topically as needed (for pain).     . metoprolol succinate (TOPROL-XL) 200 MG 24 hr tablet Take 1 tablet (200 mg total) by mouth daily. Take with or immediately following a meal. 30 tablet 0  . ondansetron (ZOFRAN) 8 MG tablet Take 1 tablet (8 mg total) by mouth every 8 (eight) hours as needed for nausea or vomiting. 30 tablet 2  . oxyCODONE-acetaminophen (PERCOCET/ROXICET) 5-325 MG tablet Take 1 tablet by mouth every 8 (eight) hours as needed for severe pain. 12 tablet 0  . OXYGEN Inhale 4 L into the lungs daily.     . potassium chloride SA (K-DUR,KLOR-CON)  20 MEQ tablet Take 1 tablet (20 mEq total) by mouth daily. 30 tablet 0  . promethazine (PHENERGAN) 25 MG tablet Take 25 mg by mouth every 4 (four) hours as needed for nausea or vomiting. Reported on 10/10/2015    . protein supplement shake (PREMIER PROTEIN) LIQD Take 325 mLs (11 oz total) by mouth 2 (two) times daily between meals. 60 Can 0  . rosuvastatin (CRESTOR) 10 MG tablet Take 1 tablet (10 mg total) by mouth daily. 30 tablet 3  . sildenafil (REVATIO) 20 MG tablet Take 20 mg by mouth 3 (three) times daily.    [name] spironolactone (ALDACTONE) 25 MG tablet Take 0.5 tablets (12.5 mg total) by mouth daily. 15 tablet 0  . furosemide (  LASIX) 40 MG tablet Take 1 tablet (40 mg total) by mouth 2 (two) times daily. 60 tablet 0  . fluticasone-salmeterol (ADVAIR HFA) 115-21 MCG/ACT inhaler Inhale 2 puffs into the lungs 2 (two) times daily. (Patient not taking: Reported on 09/14/2017) 1 Inhaler 0  . levofloxacin (LEVAQUIN) 500 MG tablet Take 1 tablet (500 mg total) by mouth daily. (Patient not taking: Reported on 09/14/2017) 3 tablet 0  . polyethylene glycol (MIRALAX / GLYCOLAX) packet Take 17 g by mouth daily as needed for moderate constipation.    . predniSONE (DELTASONE) 10 MG tablet 2 tabs po day1; 1 tab po day2,3 then stop (Patient not taking: Reported on 09/14/2017) 4 tablet 0   No facility-administered medications prior to visit.      Allergies:   Aspirin; Other; and Penicillins   Social History   Socioeconomic History  . Marital status: Divorced    Spouse name: Not on file  . Number of children: Not on file  . Years of education: Not on file  . Highest education level: Not on file  Occupational History  . Occupation: Retired- factory work    Comment: exposed to [place]  . Financial resource strain: Not on file  . Food insecurity:    Worry: Not on file    Inability: Not on file  . Transportation needs:    Medical: Not on file    Non-medical: Not on file  Tobacco Use  .  Smoking status: Former Smoker    Packs/day: 0.30    Years: 2.00    Pack years: 0.60    Types: Cigarettes    Last attempt to quit: 06/23/1990    Years since quitting: 27.2  . Smokeless tobacco: Never Used  Substance and Sexual Activity  . Alcohol use: No  . Drug use: No  . Sexual activity: Not on file  Lifestyle  . Physical activity:    Days per week: Not on file    Minutes per session: Not on file  . Stress: Not on file  Relationships  . Social connections:    Talks on phone: Not on file    Gets together: Not on file    Attends religious service: Not on file    Active member of club or organization: Not on file    Attends meetings of clubs or organizations: Not on file    Relationship status: Not on file  Other Topics Concern  . Not on file  Social History Narrative   Lives in [place] with daughter. Has 4 children. No pets.      Work - Restaurant manager, fast food, retired      Diet - regular     Family History:  The patient's family history includes Breast cancer in her maternal aunt; COPD in her mother; Heart disease in her mother; Hypertension in her father, mother, sister, and sister; Thyroid disease in her mother, sister, and sister.   Review of Systems:   Please see the history of present illness.     General:  No chills, fever, night sweats or weight changes.  Cardiovascular:  No chest pain, paroxysmal nocturnal dyspnea. Positive for palpitations, dyspnea on exertion, edema, and orthopnea.  Dermatological: No rash, lesions/masses Respiratory: No cough, dyspnea Urologic: No hematuria, dysuria Abdominal:   No nausea, vomiting, diarrhea, bright red blood per rectum, melena, or hematemesis Neurologic:  No visual changes, wkns, changes in mental status. All other systems reviewed and are otherwise negative except as noted above.   Physical  Exam:    VS:  BP 112/82 (BP Location: Left Arm, Patient Position: Sitting, Cuff Size: Normal)   Pulse 93   Ht 5\' 1"  (1.549 m)   Wt 231 lb  5 oz (104.9 kg) Comment: weight at home today. Pt. unable to stand for weight today.  BMI 43.71 kg/m    General: Well developed, elderly African American female appearing in no acute distress. Head: Normocephalic, atraumatic, sclera non-icteric, no xanthomas, nares are without discharge.  Neck: No carotid bruits. JVD not elevated.  Lungs: Respirations regular and unlabored, without wheezes or rales.  Heart: Irregularly irregular. No S3 or S4.  No rubs or gallops appreciated. 2/6 holosystolic murmur along Apex.  Abdomen: Soft, non-tender, non-distended with normoactive bowel sounds. No hepatomegaly. No rebound/guarding. No obvious abdominal masses. Msk:  Strength and tone appear normal for age. No joint deformities or effusions. Extremities: No clubbing or cyanosis. 1+ pitting edema bilaterally. Distal pedal pulses are 2+ bilaterally. Neuro: Alert and oriented X 3. Moves all extremities spontaneously. No focal deficits noted. Psych:  Responds to questions appropriately with a normal affect. Skin: No rashes or lesions noted  Wt Readings from Last 3 Encounters:  09/14/17 231 lb 5 oz (104.9 kg)  08/28/17 219 lb 4.8 oz (99.5 kg)  07/01/17 219 lb 12.8 oz (99.7 kg)      Studies/Labs Reviewed:   EKG:  EKG is ordered today.  The ekg ordered today demonstrates atrial fibrillation, HR 93, with frequent PVC's. Nonspecific ST abnormalities along lateral leads (similar to prior tracings).   Recent Labs: 08/16/2017: ALT 21; B Natriuretic Peptide 655.0; TSH 1.170 08/25/2017: Hemoglobin 10.5; Magnesium 1.7; Platelets 205 08/28/2017: BUN 25; Creatinine, Ser 0.83; Potassium 3.9; Sodium 136   Lipid Panel    Component Value Date/Time   CHOL 102 08/17/2017 0435   CHOL 137 02/03/2013 0441   TRIG 67 08/17/2017 0435   TRIG 197 02/03/2013 0441   HDL 52 08/17/2017 0435   HDL 25 (L) 02/03/2013 0441   CHOLHDL 2.0 08/17/2017 0435   VLDL 13 08/17/2017 0435   VLDL 39 02/03/2013 0441   LDLCALC 37 08/17/2017  0435   LDLCALC 73 02/03/2013 0441    Additional studies/ records that were reviewed today include:   Echocardiogram: 07/2017 Study Conclusions  - Left ventricle: The cavity size was mildly dilated. Systolic   function was moderately to severely reduced. The estimated   ejection fraction was in the range of 30% to 35%. Diffuse   hypokinesis. - Aortic valve: There was trivial regurgitation. Valve area (VTI):   2.25 cm^2. Valve area (Vmax): 1.67 cm^2. Valve area (Vmean): 1.92   cm^2. - Mitral valve: There was severe regurgitation. - Left atrium: The atrium was moderately dilated. - Right atrium: The atrium was mildly dilated. - Tricuspid valve: There was moderate regurgitation.   Assessment:    1. Acute on chronic combined systolic (congestive) and diastolic (congestive) heart failure (HCC)   2. Paroxysmal atrial fibrillation ([place])   3. Sinoatrial node dysfunction (HCC)   4. Essential hypertension   5. Chronic respiratory failure with hypoxia (HCC)      Plan:   In order of problems listed above:  1. Acute on Chronic Combined Systolic and Diastolic CHF - recently admitted for an acute CHF exacerbation in the setting of new-onset atrial fibrillation with RVR.  EF found to be reduced to 30-35% at that time which was thought to be tachycardia-mediated. The plan is for a repeat echo in 3 months following medical therapy to  reassess her EF.  - Over the past week, she has developed worsening dyspnea on exertion, orthopnea, and lower extremity edema with an associated weight gain of 10 lbs on her home scales (221 lbs --> 231 lbs). She was switched from Torsemide to Lasix by her PCP due to frequent urination prior to this weight gain but has experienced minimal urination with Lasix per her report.  - I reviewed with the patient and her daughter in great detail today that Torsemide has increased bioavailability and that she was switched to the medication for a reason. In the setting of  her weight gain and associated symptoms, will switch back to Torsemide 20mg  daily. Will obtain a repeat BMET today. They are to call with her weight this coming Friday. Will arrange for close follow-up within the next 7-10 days as she may require further adjustments in her diuretic dosing. - continue Toprol-XL, Losartan, and Spironolactone at current dosing. Repeat BMET pending to assess kidney function and K+ levels.   2. Paroxysmal Atrial Fibrillation - s/p TEE-guided DCCV on 08/20/2017 with successful return to normal sinus rhythm. She did develop recurrent atrial fibrillation on 3/3 but spontaneously converted back to normal sinus rhythm. She has noted intermittent palpitations since and is in atrial fibrillation during today's visit with HR in the 80's to 90's. I have asked her to record her HR readings before, during, and after working with PT so we can assess her HR with activity. She is already on Toprol-XL 200mg  daily and Digoxin 0.125mg  daily. Will recheck Dig Level today. Not on Cardizem due to reduced EF and would avoid Amiodarone due to her chronic respiratory issues.  - she denies any evidence of active bleeding. Continue Eliquis for anticoagulation.   3. SA Node Dysfunction - s/p [employer] PPM placement in 2015.  - followed by Dr. Caryl Comes  4. HTN - BP is well controlled at 112/83 during today's visit. - continue Losartan 25mg  daily, Toprol-XL 200mg  daily, and Spironolactone 12.5mg  daily.  5. Chronic Respiratory Failure - remains on 4L [place] at baseline. Has been working with [facility]OT since discharge from SNF who are following her vitals closely.   6. Mitral Regurgitation - noted to have severe regurgitation by most recent echocardiogram. Continue to follow. If CHF exacerbations continue despite medication compliance and adjustments as outlined above, would need to further investigate how this is contributing.  - possibly a MitraClip candidate as conventional MVR would  not ideal in the setting of her multiple co-morbidities.     Medication Adjustments/Labs and Tests Ordered: Current medicines are reviewed at length with the patient today.  Concerns regarding medicines are outlined above.  Medication changes, Labs and Tests ordered today are listed in the Patient Instructions below. Patient Instructions  Medication Instructions: - Your physician has recommended you make the following change in your medication:  1) STOP lasix (furosemide) 2) Re-Start demadex (torsemide) 20 mg- take 1 tablet by mouth once daily  Labwork: - Your physician recommends that you have lab work today: BMP/ Digoxin  Procedures/Testing: - none ordered  Follow-Up: - Your physician recommends that you schedule a follow-up appointment in: 2 weeks with [name], NP (Wednesday 09/30/17 at 2:00 pm)  Any Additional Special Instructions Will Be Listed Below (If Applicable). - Please call the office on Friday 09/18/17 with your weights for this week [phone]  If you need a refill on your cardiac medications before your next appointment, please call your pharmacy.     Signed, [place]  Wynelle Fanny, PA-C  09/14/2017 4:53 PM    [facility] [address] [place], [place] [address] Phone: [phone]

## 2017-09-13 DIAGNOSIS — I509 Heart failure, unspecified: Secondary | ICD-10-CM | POA: Diagnosis not present

## 2017-09-14 ENCOUNTER — Telehealth: Payer: Self-pay | Admitting: Family Medicine

## 2017-09-14 ENCOUNTER — Ambulatory Visit: Payer: Medicare HMO | Admitting: Student

## 2017-09-14 ENCOUNTER — Encounter: Payer: Self-pay | Admitting: Student

## 2017-09-14 VITALS — BP 112/82 | HR 93 | Ht 61.0 in | Wt 231.3 lb

## 2017-09-14 DIAGNOSIS — J9611 Chronic respiratory failure with hypoxia: Secondary | ICD-10-CM

## 2017-09-14 DIAGNOSIS — I10 Essential (primary) hypertension: Secondary | ICD-10-CM

## 2017-09-14 DIAGNOSIS — J9622 Acute and chronic respiratory failure with hypercapnia: Secondary | ICD-10-CM | POA: Diagnosis not present

## 2017-09-14 DIAGNOSIS — I5032 Chronic diastolic (congestive) heart failure: Secondary | ICD-10-CM | POA: Diagnosis not present

## 2017-09-14 DIAGNOSIS — I481 Persistent atrial fibrillation: Secondary | ICD-10-CM | POA: Diagnosis not present

## 2017-09-14 DIAGNOSIS — E119 Type 2 diabetes mellitus without complications: Secondary | ICD-10-CM | POA: Diagnosis not present

## 2017-09-14 DIAGNOSIS — J9621 Acute and chronic respiratory failure with hypoxia: Secondary | ICD-10-CM | POA: Diagnosis not present

## 2017-09-14 DIAGNOSIS — I5023 Acute on chronic systolic (congestive) heart failure: Secondary | ICD-10-CM | POA: Diagnosis not present

## 2017-09-14 DIAGNOSIS — I480 Paroxysmal atrial fibrillation: Secondary | ICD-10-CM | POA: Diagnosis not present

## 2017-09-14 DIAGNOSIS — I5043 Acute on chronic combined systolic (congestive) and diastolic (congestive) heart failure: Secondary | ICD-10-CM | POA: Diagnosis not present

## 2017-09-14 DIAGNOSIS — I495 Sick sinus syndrome: Secondary | ICD-10-CM

## 2017-09-14 DIAGNOSIS — I110 Hypertensive heart disease with heart failure: Secondary | ICD-10-CM | POA: Diagnosis not present

## 2017-09-14 DIAGNOSIS — I272 Pulmonary hypertension, unspecified: Secondary | ICD-10-CM | POA: Diagnosis not present

## 2017-09-14 DIAGNOSIS — J441 Chronic obstructive pulmonary disease with (acute) exacerbation: Secondary | ICD-10-CM | POA: Diagnosis not present

## 2017-09-14 MED ORDER — TORSEMIDE 20 MG PO TABS: 20.0000 mg | ORAL_TABLET | Freq: Every day | ORAL | Status: DC

## 2017-09-14 NOTE — Patient Instructions (Signed)
Medication Instructions: - Your physician has recommended you make the following change in your medication:  1) STOP lasix (furosemide) 2) Re-Start demadex (torsemide) 20 mg- take 1 tablet by mouth once daily  Labwork: - Your physician recommends that you have lab work today: BMP/ Digoxin  Procedures/Testing: - none ordered  Follow-Up: - Your physician recommends that you schedule a follow-up appointment in: 2 weeks with [name], NP (Wednesday 09/30/17 at 2:00 pm)   Any Additional Special Instructions Will Be Listed Below (If Applicable). - Please call the office on Friday 09/18/17 with your weights for this week [phone]    If you need a refill on your cardiac medications before your next appointment, please call your pharmacy.

## 2017-09-14 NOTE — Telephone Encounter (Signed)
Copied from [place] [id number]. Topic: Quick Communication - See Telephone Encounter >> Sep 14, 2017  2:49 PM Synthia Innocent wrote: CRM for notification. See Telephone encounter for: 09/14/17. Requesting verbal PT orders for 1x a week for 1 week , 2x a week for 3 weeks and 1x a week for 1 week.

## 2017-09-14 NOTE — Telephone Encounter (Signed)
It is okay to give verbal orders.

## 2017-09-14 NOTE — Telephone Encounter (Signed)
Please advise 

## 2017-09-15 ENCOUNTER — Telehealth: Payer: Self-pay | Admitting: Family Medicine

## 2017-09-15 DIAGNOSIS — J9621 Acute and chronic respiratory failure with hypoxia: Secondary | ICD-10-CM | POA: Diagnosis not present

## 2017-09-15 DIAGNOSIS — I110 Hypertensive heart disease with heart failure: Secondary | ICD-10-CM | POA: Diagnosis not present

## 2017-09-15 DIAGNOSIS — I5023 Acute on chronic systolic (congestive) heart failure: Secondary | ICD-10-CM | POA: Diagnosis not present

## 2017-09-15 DIAGNOSIS — I272 Pulmonary hypertension, unspecified: Secondary | ICD-10-CM | POA: Diagnosis not present

## 2017-09-15 DIAGNOSIS — I481 Persistent atrial fibrillation: Secondary | ICD-10-CM | POA: Diagnosis not present

## 2017-09-15 DIAGNOSIS — J9622 Acute and chronic respiratory failure with hypercapnia: Secondary | ICD-10-CM | POA: Diagnosis not present

## 2017-09-15 DIAGNOSIS — J441 Chronic obstructive pulmonary disease with (acute) exacerbation: Secondary | ICD-10-CM | POA: Diagnosis not present

## 2017-09-15 DIAGNOSIS — E119 Type 2 diabetes mellitus without complications: Secondary | ICD-10-CM | POA: Diagnosis not present

## 2017-09-15 DIAGNOSIS — I5032 Chronic diastolic (congestive) heart failure: Secondary | ICD-10-CM | POA: Diagnosis not present

## 2017-09-15 LAB — BASIC METABOLIC PANEL
BUN/Creatinine Ratio: 25 (ref 12–28)
BUN: 26 mg/dL (ref 8–27)
CO2: 23 mmol/L (ref 20–29)
Calcium: 9 mg/dL (ref 8.7–10.3)
Chloride: 108 mmol/L — ABNORMAL HIGH (ref 96–106)
Creatinine, Ser: 1.04 mg/dL — ABNORMAL HIGH (ref 0.57–1.00)
GFR calc Af Amer: 61 mL/min/{1.73_m2} (ref >59)
GFR calc non Af Amer: 53 mL/min/{1.73_m2} — ABNORMAL LOW (ref >59)
Glucose: 110 mg/dL — ABNORMAL HIGH (ref 65–99)
Potassium: 4.5 mmol/L (ref 3.5–5.2)
Sodium: 143 mmol/L (ref 134–144)

## 2017-09-15 LAB — DIGOXIN LEVEL: Digoxin, Serum: 1.6 ng/mL — ABNORMAL HIGH (ref 0.5–0.9)

## 2017-09-15 NOTE — Telephone Encounter (Signed)
Spoke with [name] from [employer] calls stating patients  blood pressure was 164/102 however patient didn't take blood pressure medication this am.  She had patient take her blood pressure medication.  Patient has history of non-complaince with medications , Patient pulled out bags of medications and she helped  Her separate medications into a pill box.  [name] ([employer])  will contact cardiology and advise them of blood pressure readings since they just saw her yesterday.

## 2017-09-15 NOTE — Telephone Encounter (Signed)
Her blood pressure was well controlled yesterday when she is seen by cardiology.  They should plan on checking her blood pressure once she has taken her medication and let us know what it is running at that time.  Obviously if she has had any chest pain or trouble breathing she should be evaluated.

## 2017-09-15 NOTE — Telephone Encounter (Signed)
Verbal orders given  

## 2017-09-15 NOTE — Telephone Encounter (Signed)
Left message to return call, ok for pec to speak to [facility] and inform of message

## 2017-09-15 NOTE — Telephone Encounter (Signed)
Copied from [place] [id number]. Topic: Quick Communication - See Telephone Encounter >> Sep 15, 2017  1:57 PM Lolita Rieger, RMA wrote: CRM for notification. See Telephone encounter for: 09/15/17.[name] from [facility] called to inform pt B/P 164/102 call back # for [name] [phone]

## 2017-09-15 NOTE — Telephone Encounter (Signed)
fyi

## 2017-09-16 DIAGNOSIS — I5032 Chronic diastolic (congestive) heart failure: Secondary | ICD-10-CM | POA: Diagnosis not present

## 2017-09-16 DIAGNOSIS — J9621 Acute and chronic respiratory failure with hypoxia: Secondary | ICD-10-CM | POA: Diagnosis not present

## 2017-09-16 DIAGNOSIS — I481 Persistent atrial fibrillation: Secondary | ICD-10-CM | POA: Diagnosis not present

## 2017-09-16 DIAGNOSIS — I5023 Acute on chronic systolic (congestive) heart failure: Secondary | ICD-10-CM | POA: Diagnosis not present

## 2017-09-16 DIAGNOSIS — J9622 Acute and chronic respiratory failure with hypercapnia: Secondary | ICD-10-CM | POA: Diagnosis not present

## 2017-09-16 DIAGNOSIS — E119 Type 2 diabetes mellitus without complications: Secondary | ICD-10-CM | POA: Diagnosis not present

## 2017-09-16 DIAGNOSIS — I272 Pulmonary hypertension, unspecified: Secondary | ICD-10-CM | POA: Diagnosis not present

## 2017-09-16 DIAGNOSIS — J441 Chronic obstructive pulmonary disease with (acute) exacerbation: Secondary | ICD-10-CM | POA: Diagnosis not present

## 2017-09-16 DIAGNOSIS — I110 Hypertensive heart disease with heart failure: Secondary | ICD-10-CM | POA: Diagnosis not present

## 2017-09-16 NOTE — Telephone Encounter (Signed)
Informed ashley of message from [place] and she states she will go and check her blood pressure today.

## 2017-09-17 ENCOUNTER — Telehealth: Payer: Self-pay | Admitting: Family Medicine

## 2017-09-17 DIAGNOSIS — J441 Chronic obstructive pulmonary disease with (acute) exacerbation: Secondary | ICD-10-CM | POA: Diagnosis not present

## 2017-09-17 DIAGNOSIS — I5023 Acute on chronic systolic (congestive) heart failure: Secondary | ICD-10-CM | POA: Diagnosis not present

## 2017-09-17 DIAGNOSIS — I110 Hypertensive heart disease with heart failure: Secondary | ICD-10-CM | POA: Diagnosis not present

## 2017-09-17 DIAGNOSIS — I481 Persistent atrial fibrillation: Secondary | ICD-10-CM | POA: Diagnosis not present

## 2017-09-17 DIAGNOSIS — I5032 Chronic diastolic (congestive) heart failure: Secondary | ICD-10-CM | POA: Diagnosis not present

## 2017-09-17 DIAGNOSIS — J9621 Acute and chronic respiratory failure with hypoxia: Secondary | ICD-10-CM | POA: Diagnosis not present

## 2017-09-17 DIAGNOSIS — E119 Type 2 diabetes mellitus without complications: Secondary | ICD-10-CM | POA: Diagnosis not present

## 2017-09-17 DIAGNOSIS — I272 Pulmonary hypertension, unspecified: Secondary | ICD-10-CM | POA: Diagnosis not present

## 2017-09-17 DIAGNOSIS — J9622 Acute and chronic respiratory failure with hypercapnia: Secondary | ICD-10-CM | POA: Diagnosis not present

## 2017-09-17 MED ORDER — GLUCOSE BLOOD VI STRP: ORAL_STRIP | 12 refills | Status: DC

## 2017-09-17 NOTE — Telephone Encounter (Signed)
fyi Patient is scheduled with cardiology Monday 09/21/17

## 2017-09-17 NOTE — Telephone Encounter (Signed)
Patient notified and will keep appointment.  

## 2017-09-17 NOTE — Telephone Encounter (Signed)
Patient states she needs new rx for test strips, she states someone cleaned her house and must have thrown them away, sent in new strips for true metric strips

## 2017-09-17 NOTE — Telephone Encounter (Signed)
They should continue to monitor her blood pressure at home and keep track of her readings and plan on seeing cardiology on Monday.  She will likely need additional blood pressure medication if it remains elevated.

## 2017-09-17 NOTE — Telephone Encounter (Signed)
Copied from [place]. Topic: Inquiry >> Sep 17, 2017 12:57 PM Scherrie Gerlach wrote: Reason for CRM: Derwood Kaplan, PT with [facility] states when she saw the pt today her bp was 140/100. Jana Half was there at 12 noon, and pt reports taking her bp med around [place] states she did not do any PT with the pt. Left her sitting as she found her.  Jana Half took the bp in both arms, and both arms elevated. Jana Half has already left the pt, so if any instructions for the pt, please call the pt.

## 2017-09-18 ENCOUNTER — Telehealth: Payer: Self-pay | Admitting: Student

## 2017-09-18 DIAGNOSIS — I5023 Acute on chronic systolic (congestive) heart failure: Secondary | ICD-10-CM | POA: Diagnosis not present

## 2017-09-18 DIAGNOSIS — J9622 Acute and chronic respiratory failure with hypercapnia: Secondary | ICD-10-CM | POA: Diagnosis not present

## 2017-09-18 DIAGNOSIS — I5032 Chronic diastolic (congestive) heart failure: Secondary | ICD-10-CM | POA: Diagnosis not present

## 2017-09-18 DIAGNOSIS — E119 Type 2 diabetes mellitus without complications: Secondary | ICD-10-CM | POA: Diagnosis not present

## 2017-09-18 DIAGNOSIS — J9621 Acute and chronic respiratory failure with hypoxia: Secondary | ICD-10-CM | POA: Diagnosis not present

## 2017-09-18 DIAGNOSIS — I272 Pulmonary hypertension, unspecified: Secondary | ICD-10-CM | POA: Diagnosis not present

## 2017-09-18 DIAGNOSIS — I481 Persistent atrial fibrillation: Secondary | ICD-10-CM | POA: Diagnosis not present

## 2017-09-18 DIAGNOSIS — J441 Chronic obstructive pulmonary disease with (acute) exacerbation: Secondary | ICD-10-CM | POA: Diagnosis not present

## 2017-09-18 DIAGNOSIS — I110 Hypertensive heart disease with heart failure: Secondary | ICD-10-CM | POA: Diagnosis not present

## 2017-09-18 NOTE — Telephone Encounter (Signed)
I called and spoke with the patient to follow up on her weight's since her office visit on 09/14/17 with Bernerd Pho, [place].  Per the patient, her home weights have been: Monday 3/25- 233 lbs Tuesday 3/26- today -231.6 lbs  She has instructed to stop furosemide on 3/25 and resume torsemide 20 mg once daily.  She states she has been urinating more on the torsemide and breathing is stable.   I advised her I will forward to [place], [place] to review. The patient has follow up scheduled with [name], NP in the [facility] on 4/1 and 4/10 with [name], NP.

## 2017-09-18 NOTE — Telephone Encounter (Signed)
Noted  

## 2017-09-18 NOTE — Telephone Encounter (Signed)
BMP noted on appt notes for [name], NP.

## 2017-09-18 NOTE — Telephone Encounter (Signed)
Thank you for the update. I am glad her weight is starting to decline and she is feeling better with her current dosing. Will need a repeat BMET at the time that she sees [name] on 09/30/2017.   Signed, Erma Heritage, PA-C 09/18/2017, 4:48 PM Pager: [phone]

## 2017-09-21 ENCOUNTER — Ambulatory Visit: Payer: Medicare HMO | Attending: Family | Admitting: Family

## 2017-09-21 ENCOUNTER — Encounter: Payer: Self-pay | Admitting: Family

## 2017-09-21 ENCOUNTER — Ambulatory Visit: Payer: Medicare HMO | Admitting: Family

## 2017-09-21 VITALS — BP 105/68 | HR 92 | Resp 18 | Ht 60.0 in | Wt 232.2 lb

## 2017-09-21 DIAGNOSIS — Z794 Long term (current) use of insulin: Secondary | ICD-10-CM

## 2017-09-21 DIAGNOSIS — G8929 Other chronic pain: Secondary | ICD-10-CM | POA: Insufficient documentation

## 2017-09-21 DIAGNOSIS — Z803 Family history of malignant neoplasm of breast: Secondary | ICD-10-CM | POA: Insufficient documentation

## 2017-09-21 DIAGNOSIS — E079 Disorder of thyroid, unspecified: Secondary | ICD-10-CM | POA: Insufficient documentation

## 2017-09-21 DIAGNOSIS — I5042 Chronic combined systolic (congestive) and diastolic (congestive) heart failure: Secondary | ICD-10-CM | POA: Diagnosis not present

## 2017-09-21 DIAGNOSIS — Z79891 Long term (current) use of opiate analgesic: Secondary | ICD-10-CM | POA: Insufficient documentation

## 2017-09-21 DIAGNOSIS — I110 Hypertensive heart disease with heart failure: Secondary | ICD-10-CM | POA: Diagnosis not present

## 2017-09-21 DIAGNOSIS — Z950 Presence of cardiac pacemaker: Secondary | ICD-10-CM | POA: Diagnosis not present

## 2017-09-21 DIAGNOSIS — Z7901 Long term (current) use of anticoagulants: Secondary | ICD-10-CM | POA: Insufficient documentation

## 2017-09-21 DIAGNOSIS — Z9889 Other specified postprocedural states: Secondary | ICD-10-CM | POA: Insufficient documentation

## 2017-09-21 DIAGNOSIS — I890 Lymphedema, not elsewhere classified: Secondary | ICD-10-CM | POA: Diagnosis not present

## 2017-09-21 DIAGNOSIS — Z825 Family history of asthma and other chronic lower respiratory diseases: Secondary | ICD-10-CM | POA: Insufficient documentation

## 2017-09-21 DIAGNOSIS — J441 Chronic obstructive pulmonary disease with (acute) exacerbation: Secondary | ICD-10-CM | POA: Diagnosis not present

## 2017-09-21 DIAGNOSIS — R002 Palpitations: Secondary | ICD-10-CM | POA: Insufficient documentation

## 2017-09-21 DIAGNOSIS — Z90710 Acquired absence of both cervix and uterus: Secondary | ICD-10-CM | POA: Insufficient documentation

## 2017-09-21 DIAGNOSIS — I4819 Other persistent atrial fibrillation: Secondary | ICD-10-CM

## 2017-09-21 DIAGNOSIS — I272 Pulmonary hypertension, unspecified: Secondary | ICD-10-CM

## 2017-09-21 DIAGNOSIS — Z79899 Other long term (current) drug therapy: Secondary | ICD-10-CM | POA: Insufficient documentation

## 2017-09-21 DIAGNOSIS — Z886 Allergy status to analgesic agent status: Secondary | ICD-10-CM | POA: Insufficient documentation

## 2017-09-21 DIAGNOSIS — G4733 Obstructive sleep apnea (adult) (pediatric): Secondary | ICD-10-CM | POA: Insufficient documentation

## 2017-09-21 DIAGNOSIS — I5022 Chronic systolic (congestive) heart failure: Secondary | ICD-10-CM

## 2017-09-21 DIAGNOSIS — E119 Type 2 diabetes mellitus without complications: Secondary | ICD-10-CM | POA: Insufficient documentation

## 2017-09-21 DIAGNOSIS — I481 Persistent atrial fibrillation: Secondary | ICD-10-CM | POA: Diagnosis not present

## 2017-09-21 DIAGNOSIS — E785 Hyperlipidemia, unspecified: Secondary | ICD-10-CM | POA: Diagnosis not present

## 2017-09-21 DIAGNOSIS — Z87891 Personal history of nicotine dependence: Secondary | ICD-10-CM | POA: Insufficient documentation

## 2017-09-21 DIAGNOSIS — I10 Essential (primary) hypertension: Secondary | ICD-10-CM

## 2017-09-21 DIAGNOSIS — Z8349 Family history of other endocrine, nutritional and metabolic diseases: Secondary | ICD-10-CM | POA: Insufficient documentation

## 2017-09-21 DIAGNOSIS — E669 Obesity, unspecified: Secondary | ICD-10-CM | POA: Diagnosis not present

## 2017-09-21 DIAGNOSIS — Z8249 Family history of ischemic heart disease and other diseases of the circulatory system: Secondary | ICD-10-CM | POA: Diagnosis not present

## 2017-09-21 DIAGNOSIS — R42 Dizziness and giddiness: Secondary | ICD-10-CM | POA: Insufficient documentation

## 2017-09-21 DIAGNOSIS — Z9049 Acquired absence of other specified parts of digestive tract: Secondary | ICD-10-CM | POA: Insufficient documentation

## 2017-09-21 DIAGNOSIS — Z8673 Personal history of transient ischemic attack (TIA), and cerebral infarction without residual deficits: Secondary | ICD-10-CM | POA: Insufficient documentation

## 2017-09-21 DIAGNOSIS — R220 Localized swelling, mass and lump, head: Secondary | ICD-10-CM | POA: Diagnosis not present

## 2017-09-21 DIAGNOSIS — Z880 Allergy status to penicillin: Secondary | ICD-10-CM | POA: Insufficient documentation

## 2017-09-21 DIAGNOSIS — Z888 Allergy status to other drugs, medicaments and biological substances status: Secondary | ICD-10-CM | POA: Insufficient documentation

## 2017-09-21 NOTE — Progress Notes (Signed)
Patient ID: Debra Saunders, female    DOB: 05-11-1946, 72 y.o.   MRN: [id number]  HPI  Debra Saunders is a 72 y/o female with a history of DM, hyperlipidemia, HTN, stroke, thyroid disease, COPD, obstructive sleep apnea, lymphedema, atrial fibrillation, pulmonary HTN, former tobacco use and chronic heart failure.   TEE report from 08/20/17 reviewed and showed an EF of 35%  Admitted 08/16/17 due to acute HF exacerbation. Initially needed bipap and then transitioned to 4L of oxygen on nasal cannula. Initially needed IV lasix and then transitioned to oral diuretics. Given prednisone taper for COPD exacerbation. Consulted pulmonology, EP and cardiology. Patient was cardioverted due to atrial fibrillation. Discharged after 12 days.   Debra Saunders presents today for Debra Saunders initial visit with a chief complaint of moderate fatigue upon minimal exertion. Debra Saunders describes this as chronic in nature having been present for several years. Debra Saunders does feel like Debra Saunders energy level has been worsening recently. Debra Saunders has associated cough, shortness of breath, decreased appetite, facial swelling, dizziness, pedal edema, palpitations, abdominal distention, difficulty sleeping and weight gain. Debra Saunders denies any chest pain.   Past Medical History:  Diagnosis Date  . Arthritis   . Atrial fibrillation, persistent ([place])    a. s/p TEE-guided DCCV on 08/20/2017  . Chest pain    a. 01/2012 Cath: nonobs dzs;  c. 04/2014 MV: No ischemia.  . Chronic back pain   . Chronic combined systolic (congestive) and diastolic (congestive) heart failure ([place])    a. Dx 2005 @ [facility];  b. 02/2014 Echo: Ef 55-60%, no rwma, mild MR, PASP 69mmHg. c. 07/2017: EF 30-35%, diffuse HK, trivial AI, severe MR, moderate TR  . Chronic respiratory failure ([facility])    a. on home O2  . COPD (chronic obstructive pulmonary disease) ([place])    a. Home O2 - 3lpm  . Gastritis   . History of intracranial hemorrhage    6/14 described by neurosurgery as "small frontal posttraumatic SAH "  .  Hyperlipidemia   . Hypertension   . Lipoma of back   . Lymphedema    a. Chronic LLE edema.  . Obesity   . Sepsis with metabolic encephalopathy ([place])   . Sleep apnea   . Streptococcal bacteremia   . Stroke ([place])   . Symptomatic bradycardia    a. s/p BSX PPM.  . Thyroid disorder   . Thyroid nodule   . Type II diabetes mellitus ([place])   . Urinary incontinence    Past Surgical History:  Procedure Laterality Date  . ABDOMINAL HYSTERECTOMY  1969  . CARDIAC CATHETERIZATION  2013   @ [place]: No obstructive CAD: Only 20% ostial left circumflex.  . CHOLECYSTECTOMY    . CYST REMOVAL TRUNK  2002   BACK  . INSERT / REPLACE / REMOVE PACEMAKER    . lipoma removal  2014   back  . PACEMAKER INSERTION  [address] Scientific dual chamber pacemaker implanted by Dr Caryl Comes for symptomatic bradycardia  . PERMANENT PACEMAKER INSERTION N/A 01/09/2014   Procedure: PERMANENT PACEMAKER INSERTION;  Surgeon: Deboraha Sprang, MD;  Location: [facility] CATH LAB;  Service: Cardiovascular;  Laterality: N/A;  . TEE WITHOUT CARDIOVERSION N/A 08/20/2017   Procedure: TRANSESOPHAGEAL ECHOCARDIOGRAM (TEE) & Direct current cardioversion;  Surgeon: Minna Merritts, MD;  Location: [facility] ORS;  Service: Cardiovascular;  Laterality: N/A;  . TRIGGER FINGER RELEASE    . VAGINAL HYSTERECTOMY     Family History  Problem Relation Age of Onset  . Heart disease  Mother   . Hypertension Mother   . COPD Mother        was a smoker  . Thyroid disease Mother   . Hypertension Father   . Hypertension Sister   . Thyroid disease Sister   . Hypertension Sister   . Thyroid disease Sister   . Breast cancer Maternal Aunt   . Kidney disease Neg Hx   . Bladder Cancer Neg Hx    Social History   Tobacco Use  . Smoking status: Former Smoker    Packs/day: 0.30    Years: 2.00    Pack years: 0.60    Types: Cigarettes    Last attempt to quit: 06/23/1990    Years since quitting: 27.2  . Smokeless tobacco: Never Used  Substance Use Topics  .  Alcohol use: No   Allergies  Allergen Reactions  . Aspirin Hives and Other (See Comments)    Pt states that Debra Saunders is unable to take because Debra Saunders had bleeding in Debra Saunders brain.    . Other Other (See Comments)    Pt states that Debra Saunders is unable to take blood thinners because Debra Saunders had bleeding in Debra Saunders brain.   [name] Penicillins Hives, Shortness Of Breath, Swelling and Other (See Comments)    Has patient had a PCN reaction causing immediate rash, facial/tongue/throat swelling, SOB or lightheadedness with hypotension: Yes Has patient had a PCN reaction causing severe rash involving mucus membranes or skin necrosis: No Has patient had a PCN reaction that required hospitalization No Has patient had a PCN reaction occurring within the last 10 years: No If all of the above answers are "NO", then may proceed with Cephalosporin use.   Prior to Admission medications   Medication Sig Start Date End Date Taking? Authorizing Provider  acetaminophen (TYLENOL) 325 MG tablet Take 325-650 mg by mouth every 6 (six) hours as needed for mild pain. Reported on 10/10/2015   Yes [provider]  albuterol (PROVENTIL) (2.5 MG/3ML) 0.083% nebulizer solution Take 3 mLs (2.5 mg total) by nebulization every 6 (six) hours. 08/28/17  Yes Wieting, Richard, MD  apixaban (ELIQUIS) 5 MG TABS tablet Take 1 tablet (5 mg total) by mouth 2 (two) times daily. 08/28/17  Yes Wieting, Richard, MD  digoxin (LANOXIN) 0.125 MG tablet Take 1 tablet (0.125 mg total) by mouth daily. 08/29/17  Yes Wieting, Richard, MD  Dulaglutide ([name]) 1.61 WR/6.0AV SOPN Inject 0.75 mg into the skin once a week. 12/29/16  Yes [name], MD  gabapentin (NEURONTIN) 100 MG capsule TAKE 2 CAPSULES BY MOUTH THREE TIMES DAILY 08/12/17  Yes [name], MD  glucose blood (TRUE METRIX BLOOD GLUCOSE TEST) test strip Check blood glucose twice daily Dx:E11.8, Z79.4 09/17/17  Yes [name], MD  losartan (COZAAR) 25 MG tablet Take 1 tablet (25 mg total) by  mouth daily. 08/29/17  Yes Wieting, Richard, MD  Menthol, Topical Analgesic, (BIOFREEZE EX) Apply 1 application topically as needed (for pain).    Yes [provider]  metoprolol succinate (TOPROL-XL) 200 MG 24 hr tablet Take 1 tablet (200 mg total) by mouth daily. Take with or immediately following a meal. 08/28/17  Yes Wieting, Richard, MD  ondansetron (ZOFRAN) 8 MG tablet Take 1 tablet (8 mg total) by mouth every 8 (eight) hours as needed for nausea or vomiting. 09/25/14  Yes Jackolyn Confer, MD  oxyCODONE-acetaminophen (PERCOCET/ROXICET) 5-325 MG tablet Take 1 tablet by mouth every 8 (eight) hours as needed for severe pain. 08/28/17  Yes  Wieting, Richard, MD  OXYGEN Inhale 4 L into the lungs daily.    Yes [provider]  potassium chloride SA (K-DUR,KLOR-CON) 20 MEQ tablet Take 1 tablet (20 mEq total) by mouth daily. 08/28/17  Yes Wieting, Richard, MD  promethazine (PHENERGAN) 25 MG tablet Take 25 mg by mouth every 4 (four) hours as needed for nausea or vomiting. Reported on 10/10/2015   Yes [provider]  protein supplement shake (PREMIER PROTEIN) LIQD Take 325 mLs (11 oz total) by mouth 2 (two) times daily between meals. 08/28/17  Yes Wieting, Richard, MD  rosuvastatin (CRESTOR) 10 MG tablet Take 1 tablet (10 mg total) by mouth daily. 12/29/16  Yes [name], MD  sildenafil (REVATIO) 20 MG tablet Take 20 mg by mouth 3 (three) times daily. 05/20/17  Yes [provider]  spironolactone (ALDACTONE) 25 MG tablet Take 0.5 tablets (12.5 mg total) by mouth daily. 08/28/17  Yes Wieting, Richard, MD  torsemide (DEMADEX) 20 MG tablet Take 1 tablet (20 mg total) by mouth daily. 09/14/17 12/13/17 Yes Strader, Fransisco Hertz, PA-C    Review of Systems  Constitutional: Positive for appetite change (feel full easy) and fatigue.  HENT: Positive for facial swelling (around the eyes, worse in the mornings). Negative for congestion and sore throat.   Eyes: Negative.   Respiratory:  Positive for cough (mostly at night) and shortness of breath. Negative for chest tightness.   Cardiovascular: Positive for palpitations and leg swelling. Negative for chest pain.  Gastrointestinal: Positive for abdominal distention. Negative for abdominal pain.  Endocrine: Negative.   Genitourinary: Negative.   Skin: Negative.   Allergic/Immunologic: Negative.   Neurological: Positive for dizziness and light-headedness.  Hematological: Negative for adenopathy. Does not bruise/bleed easily.  Psychiatric/Behavioral: Positive for sleep disturbance (wakes up often; wearing oxygen at 3L around the clock). Negative for dysphoric mood. The patient is not nervous/anxious.    Vitals:   09/21/17 1212  BP: 105/68  Pulse: 92  Resp: 18  SpO2: 98%  Weight: 232 lb 4 oz (105.3 kg)  Height: 5' (1.524 m)   Wt Readings from Last 3 Encounters:  09/21/17 232 lb 4 oz (105.3 kg)  09/14/17 231 lb 5 oz (104.9 kg)  08/28/17 219 lb 4.8 oz (99.5 kg)   Lab Results  Component Value Date   CREATININE 1.04 (H) 09/14/2017   CREATININE 0.83 08/28/2017   CREATININE 0.74 08/24/2017   Physical Exam  Constitutional: Debra Saunders is oriented to person, place, and time. Debra Saunders appears well-developed and well-nourished.  HENT:  Head: Normocephalic and atraumatic.  Neck: Normal range of motion. Neck supple. No JVD present.  Cardiovascular: Normal rate. An irregular rhythm present.  Pulmonary/Chest: Effort normal. Debra Saunders has no wheezes. Debra Saunders has no rales.  Abdominal: Soft. Debra Saunders exhibits no distension. There is no tenderness.  Musculoskeletal: Debra Saunders exhibits edema (3+ pitting edema in left lower leg; 1+ pitting edema in right lower leg). Debra Saunders exhibits no tenderness.  Neurological: Debra Saunders is alert and oriented to person, place, and time.  Skin: Skin is warm and dry.  Psychiatric: Debra Saunders has a normal mood and affect. Debra Saunders behavior is normal. Thought content normal.  Nursing note and vitals reviewed.  Assessment & Plan:  1: Chronic heart  failure with reduced ejection fraction- - NYHA class III - fluid overloaded today with lower extremity edema - weighing daily and Debra Saunders says that Debra Saunders weight has slowly risen. Instructed Debra Saunders to call for an overnight weight gain of >2 pounds or a weekly weight gain of >  5 pounds - weight up >13 lbs since 07/01/17 - not adding salt and the importance of closely following a 2000mg  sodium diet was discussed. Written dietary information was given to Debra Saunders about this - drinking 1 cup of coffee and enough water to swallow Debra Saunders pills daily. Debra Saunders says that Debra Saunders felt like Debra Saunders needed to barely drink anything due to worsening edema. Explained that Debra Saunders needed enough fluid intake for Debra Saunders diuretic to work well. Encouraged Debra Saunders to drink closer to 40 ounces of fluid daily.  - will also increase Debra Saunders torsemide to 40mg  QOD/ 20mg  QOD - elevate legs when sitting for long periods of time and begin using Debra Saunders compression boots - saw cardiology Ahmed Prima) 09/14/17 & returns Sharolyn Douglas) 09/30/17 - BNP on 08/16/17 was 655.0 - doubt Debra Saunders BP could tolerate changing Debra Saunders losartan to entresto  2: HTN- - BP looks good today although on the low side - saw PCP [name]) 06/15/17 & returns 10/07/17 - BMP on 09/14/17 reviewed and showed sodium 143, potassium 4.5 and GFR 61  3: Diabetes- - fasting glucose at home was 114 - A1c on 08/16/17 was 5.6%  4: Pulmonary HTN- - wearing oxygen at 3L around the clock - saw pulmonologist Raul Del) 08/04/17  - discuss pulmonary rehab with patient at Debra Saunders next appointment  5: Atrial fibrillation- - cardioverted during February hospitalization but has since gone back into AF - currently taking apixaban, digoxin & metoprolol  Patient did not bring Debra Saunders medications nor a list. Each medication was verbally reviewed with the patient and Debra Saunders was encouraged to bring the bottles to every visit to confirm accuracy of list.  Return in 6 weeks or sooner for any questions/problems before then.

## 2017-09-21 NOTE — Patient Instructions (Addendum)
Continue weighing daily and call for an overnight weight gain of > 2 pounds or a weekly weight gain of >5 pounds.  Increase fluid intake to 40 ounces daily and no more than 60 ounces daily.  Increase torsemide to 2 tablets every other day and then 1 tablet the other days  Begin wearing compression boots twice daily

## 2017-09-22 DIAGNOSIS — I5023 Acute on chronic systolic (congestive) heart failure: Secondary | ICD-10-CM | POA: Diagnosis not present

## 2017-09-22 DIAGNOSIS — J9622 Acute and chronic respiratory failure with hypercapnia: Secondary | ICD-10-CM | POA: Diagnosis not present

## 2017-09-22 DIAGNOSIS — J441 Chronic obstructive pulmonary disease with (acute) exacerbation: Secondary | ICD-10-CM | POA: Diagnosis not present

## 2017-09-22 DIAGNOSIS — I481 Persistent atrial fibrillation: Secondary | ICD-10-CM | POA: Diagnosis not present

## 2017-09-22 DIAGNOSIS — I5032 Chronic diastolic (congestive) heart failure: Secondary | ICD-10-CM | POA: Diagnosis not present

## 2017-09-22 DIAGNOSIS — I4891 Unspecified atrial fibrillation: Secondary | ICD-10-CM | POA: Insufficient documentation

## 2017-09-22 DIAGNOSIS — I110 Hypertensive heart disease with heart failure: Secondary | ICD-10-CM | POA: Diagnosis not present

## 2017-09-22 DIAGNOSIS — J9621 Acute and chronic respiratory failure with hypoxia: Secondary | ICD-10-CM | POA: Diagnosis not present

## 2017-09-22 DIAGNOSIS — E119 Type 2 diabetes mellitus without complications: Secondary | ICD-10-CM | POA: Diagnosis not present

## 2017-09-22 DIAGNOSIS — I272 Pulmonary hypertension, unspecified: Secondary | ICD-10-CM | POA: Diagnosis not present

## 2017-09-23 ENCOUNTER — Telehealth: Payer: Self-pay | Admitting: Family Medicine

## 2017-09-23 DIAGNOSIS — J441 Chronic obstructive pulmonary disease with (acute) exacerbation: Secondary | ICD-10-CM | POA: Diagnosis not present

## 2017-09-23 DIAGNOSIS — I5032 Chronic diastolic (congestive) heart failure: Secondary | ICD-10-CM | POA: Diagnosis not present

## 2017-09-23 DIAGNOSIS — I481 Persistent atrial fibrillation: Secondary | ICD-10-CM | POA: Diagnosis not present

## 2017-09-23 DIAGNOSIS — E119 Type 2 diabetes mellitus without complications: Secondary | ICD-10-CM | POA: Diagnosis not present

## 2017-09-23 DIAGNOSIS — I110 Hypertensive heart disease with heart failure: Secondary | ICD-10-CM | POA: Diagnosis not present

## 2017-09-23 DIAGNOSIS — J9621 Acute and chronic respiratory failure with hypoxia: Secondary | ICD-10-CM | POA: Diagnosis not present

## 2017-09-23 DIAGNOSIS — J9622 Acute and chronic respiratory failure with hypercapnia: Secondary | ICD-10-CM | POA: Diagnosis not present

## 2017-09-23 DIAGNOSIS — I272 Pulmonary hypertension, unspecified: Secondary | ICD-10-CM | POA: Diagnosis not present

## 2017-09-23 DIAGNOSIS — I5023 Acute on chronic systolic (congestive) heart failure: Secondary | ICD-10-CM | POA: Diagnosis not present

## 2017-09-23 MED ORDER — METOPROLOL SUCCINATE ER 200 MG PO TB24: 200.0000 mg | ORAL_TABLET | Freq: Every day | ORAL | 1 refills | Status: DC

## 2017-09-23 MED ORDER — APIXABAN 5 MG PO TABS: 5.0000 mg | ORAL_TABLET | Freq: Two times a day (BID) | ORAL | 1 refills | Status: DC

## 2017-09-23 NOTE — Telephone Encounter (Signed)
Notified [employer]

## 2017-09-23 NOTE — Telephone Encounter (Signed)
Based on review of the chart it appears she is to continue these medications.  I sent refills to her pharmacy.  Thanks.

## 2017-09-23 NOTE — Telephone Encounter (Signed)
Please advise 

## 2017-09-23 NOTE — Telephone Encounter (Signed)
Copied from [place] [id number]. Topic: Quick Communication - See Telephone Encounter >> Sep 23, 2017 11:49 AM Ether Griffins B wrote: CRM for notification. See Telephone encounter for: 09/23/17. Ashleigh with [facility] calling to verify some medications on the pt. On her current med list it doesn't show eliquis or metformin. Also they are seeing a change in the metoprolol script. If the pt is to continue the medications please a refill the eliquis and metoprolol. Send to [facility] [place] ([address]), [place]  Ashleigh's call back number [phone].

## 2017-09-24 DIAGNOSIS — J9621 Acute and chronic respiratory failure with hypoxia: Secondary | ICD-10-CM | POA: Diagnosis not present

## 2017-09-24 DIAGNOSIS — I110 Hypertensive heart disease with heart failure: Secondary | ICD-10-CM | POA: Diagnosis not present

## 2017-09-24 DIAGNOSIS — I5023 Acute on chronic systolic (congestive) heart failure: Secondary | ICD-10-CM | POA: Diagnosis not present

## 2017-09-24 DIAGNOSIS — E119 Type 2 diabetes mellitus without complications: Secondary | ICD-10-CM | POA: Diagnosis not present

## 2017-09-24 DIAGNOSIS — J9622 Acute and chronic respiratory failure with hypercapnia: Secondary | ICD-10-CM | POA: Diagnosis not present

## 2017-09-24 DIAGNOSIS — I481 Persistent atrial fibrillation: Secondary | ICD-10-CM | POA: Diagnosis not present

## 2017-09-24 DIAGNOSIS — I5032 Chronic diastolic (congestive) heart failure: Secondary | ICD-10-CM | POA: Diagnosis not present

## 2017-09-24 DIAGNOSIS — I272 Pulmonary hypertension, unspecified: Secondary | ICD-10-CM | POA: Diagnosis not present

## 2017-09-24 DIAGNOSIS — J441 Chronic obstructive pulmonary disease with (acute) exacerbation: Secondary | ICD-10-CM | POA: Diagnosis not present

## 2017-09-25 ENCOUNTER — Telehealth: Payer: Self-pay

## 2017-09-25 ENCOUNTER — Ambulatory Visit: Payer: Self-pay | Admitting: *Deleted

## 2017-09-25 DIAGNOSIS — I272 Pulmonary hypertension, unspecified: Secondary | ICD-10-CM | POA: Diagnosis not present

## 2017-09-25 DIAGNOSIS — J441 Chronic obstructive pulmonary disease with (acute) exacerbation: Secondary | ICD-10-CM | POA: Diagnosis not present

## 2017-09-25 DIAGNOSIS — I110 Hypertensive heart disease with heart failure: Secondary | ICD-10-CM | POA: Diagnosis not present

## 2017-09-25 DIAGNOSIS — I5023 Acute on chronic systolic (congestive) heart failure: Secondary | ICD-10-CM | POA: Diagnosis not present

## 2017-09-25 DIAGNOSIS — I5032 Chronic diastolic (congestive) heart failure: Secondary | ICD-10-CM | POA: Diagnosis not present

## 2017-09-25 DIAGNOSIS — J9611 Chronic respiratory failure with hypoxia: Secondary | ICD-10-CM | POA: Diagnosis not present

## 2017-09-25 DIAGNOSIS — J9621 Acute and chronic respiratory failure with hypoxia: Secondary | ICD-10-CM | POA: Diagnosis not present

## 2017-09-25 DIAGNOSIS — J9622 Acute and chronic respiratory failure with hypercapnia: Secondary | ICD-10-CM | POA: Diagnosis not present

## 2017-09-25 DIAGNOSIS — I481 Persistent atrial fibrillation: Secondary | ICD-10-CM | POA: Diagnosis not present

## 2017-09-25 DIAGNOSIS — E119 Type 2 diabetes mellitus without complications: Secondary | ICD-10-CM | POA: Diagnosis not present

## 2017-09-25 NOTE — Telephone Encounter (Signed)
Called patient to see what symptoms she was having. Patient states that she is feeling right today. She is feeling flushed, has a headache, with tarry stool ( with some stools that have been bright red for 2 weeks). She also states that there is blood attached to her stool but not in her stool. She does have history of polyps per [facility] year but is not sure it that's the issue and states she does not have external hemorrhoids. Waiting for [name] from [facility] to return my call Debra Saunders states that [name] would like to get some labs for her. Please Advise

## 2017-09-25 NOTE — Telephone Encounter (Signed)
[name] from [facility] called for several symptoms with this patient.  The patient is c/o headache, dizziness (which states is the worst symptom), fatigue, tight belly, nausea and foul,cloudy urine. Did take phenergan with good results. She also stated that she had a bm and saw a couple drops of red blood. She denies having a hemorrhoids.  B/p 118/78 and irregular HB (normal for her). Oxygen at 4L and O2 sat is 98%. CBG 112-125. She does have some shortness of breath with exertion. Her weight is up from 232 to 235. Pt does not wish to come in to the office, [employer] Will send to office and wait for response from provider. Call [name] at [facility] at [phone].  ,Reason for Disposition . [1] Fatigue (i.e., tires easily, decreased energy) AND [2] persists > 1 week  Answer Assessment - Initial Assessment Questions 1. DESCRIPTION: "Describe how you are feeling."     fatigue 2. SEVERITY: "How bad is it?"  "Can you stand and walk?"   - MILD - Feels weak or tired, but does not interfere with work, school or normal activities   - [place] to stand and walk; weakness interferes with work, school, or normal activities   - SEVERE - Unable to stand or walk     moderate 3. ONSET:  "When did the weakness begin?"     Last night 4. CAUSE: "What do you think is causing the weakness?"     Could be change in medication or uti 5. MEDICINES: "Have you recently started a new medicine or had a change in the amount of a medicine?"     Change from lasix to torsemide  6. OTHER SYMPTOMS: "Do you have any other symptoms?" (e.g., chest pain, fever, cough, SOB, vomiting, diarrhea, bleeding)     Nausea  7. PREGNANCY: "Is there any chance you are pregnant?" "When was your last menstrual period?"     no  Protocols used: WEAKNESS (GENERALIZED) AND FATIGUE-A-AH

## 2017-09-25 NOTE — Telephone Encounter (Signed)
Called patient and notified her that Dr. [name] would like for her to go to an Urgent care or ED for her issue listed in previous phone encounter.Patient verbalized understanding. I left a message for [name] at [place] her of Dr. [name] recommendation.

## 2017-09-25 NOTE — Telephone Encounter (Signed)
Patient notified

## 2017-09-25 NOTE — Telephone Encounter (Signed)
Per Dr.Sonnenberg please advise patient she needs to be evaluated at urgent care or ER

## 2017-09-28 DIAGNOSIS — J441 Chronic obstructive pulmonary disease with (acute) exacerbation: Secondary | ICD-10-CM | POA: Diagnosis not present

## 2017-09-28 DIAGNOSIS — I5032 Chronic diastolic (congestive) heart failure: Secondary | ICD-10-CM | POA: Diagnosis not present

## 2017-09-28 DIAGNOSIS — I272 Pulmonary hypertension, unspecified: Secondary | ICD-10-CM | POA: Diagnosis not present

## 2017-09-28 DIAGNOSIS — I481 Persistent atrial fibrillation: Secondary | ICD-10-CM | POA: Diagnosis not present

## 2017-09-28 DIAGNOSIS — E119 Type 2 diabetes mellitus without complications: Secondary | ICD-10-CM | POA: Diagnosis not present

## 2017-09-28 DIAGNOSIS — I5023 Acute on chronic systolic (congestive) heart failure: Secondary | ICD-10-CM | POA: Diagnosis not present

## 2017-09-28 DIAGNOSIS — J9621 Acute and chronic respiratory failure with hypoxia: Secondary | ICD-10-CM | POA: Diagnosis not present

## 2017-09-28 DIAGNOSIS — J9622 Acute and chronic respiratory failure with hypercapnia: Secondary | ICD-10-CM | POA: Diagnosis not present

## 2017-09-28 DIAGNOSIS — I110 Hypertensive heart disease with heart failure: Secondary | ICD-10-CM | POA: Diagnosis not present

## 2017-09-29 DIAGNOSIS — I481 Persistent atrial fibrillation: Secondary | ICD-10-CM | POA: Diagnosis not present

## 2017-09-29 DIAGNOSIS — I272 Pulmonary hypertension, unspecified: Secondary | ICD-10-CM | POA: Diagnosis not present

## 2017-09-29 DIAGNOSIS — J9622 Acute and chronic respiratory failure with hypercapnia: Secondary | ICD-10-CM | POA: Diagnosis not present

## 2017-09-29 DIAGNOSIS — I5023 Acute on chronic systolic (congestive) heart failure: Secondary | ICD-10-CM | POA: Diagnosis not present

## 2017-09-29 DIAGNOSIS — I5032 Chronic diastolic (congestive) heart failure: Secondary | ICD-10-CM | POA: Diagnosis not present

## 2017-09-29 DIAGNOSIS — J9621 Acute and chronic respiratory failure with hypoxia: Secondary | ICD-10-CM | POA: Diagnosis not present

## 2017-09-29 DIAGNOSIS — I110 Hypertensive heart disease with heart failure: Secondary | ICD-10-CM | POA: Diagnosis not present

## 2017-09-29 DIAGNOSIS — J441 Chronic obstructive pulmonary disease with (acute) exacerbation: Secondary | ICD-10-CM | POA: Diagnosis not present

## 2017-09-29 DIAGNOSIS — E119 Type 2 diabetes mellitus without complications: Secondary | ICD-10-CM | POA: Diagnosis not present

## 2017-09-30 ENCOUNTER — Other Ambulatory Visit: Payer: Self-pay

## 2017-09-30 ENCOUNTER — Encounter: Payer: Self-pay | Admitting: Nurse Practitioner

## 2017-09-30 ENCOUNTER — Emergency Department: Payer: Medicare HMO

## 2017-09-30 ENCOUNTER — Inpatient Hospital Stay
Admission: EM | Admit: 2017-09-30 | Discharge: 2017-10-22 | DRG: 286 | Disposition: A | Discharge status: Skilled Nursing Facility | Payer: Medicare HMO | Attending: Internal Medicine | Admitting: Internal Medicine

## 2017-09-30 ENCOUNTER — Ambulatory Visit: Payer: Medicare HMO | Admitting: Nurse Practitioner

## 2017-09-30 ENCOUNTER — Encounter: Payer: Self-pay | Admitting: Emergency Medicine

## 2017-09-30 VITALS — BP 132/74 | HR 93 | Ht 60.0 in | Wt 235.5 lb

## 2017-09-30 DIAGNOSIS — I429 Cardiomyopathy, unspecified: Secondary | ICD-10-CM | POA: Diagnosis present

## 2017-09-30 DIAGNOSIS — I509 Heart failure, unspecified: Secondary | ICD-10-CM | POA: Diagnosis not present

## 2017-09-30 DIAGNOSIS — I5043 Acute on chronic combined systolic (congestive) and diastolic (congestive) heart failure: Secondary | ICD-10-CM | POA: Diagnosis not present

## 2017-09-30 DIAGNOSIS — Z7401 Bed confinement status: Secondary | ICD-10-CM | POA: Diagnosis not present

## 2017-09-30 DIAGNOSIS — Z86718 Personal history of other venous thrombosis and embolism: Secondary | ICD-10-CM

## 2017-09-30 DIAGNOSIS — N170 Acute kidney failure with tubular necrosis: Secondary | ICD-10-CM | POA: Diagnosis not present

## 2017-09-30 DIAGNOSIS — I5082 Biventricular heart failure: Secondary | ICD-10-CM | POA: Diagnosis present

## 2017-09-30 DIAGNOSIS — I481 Persistent atrial fibrillation: Secondary | ICD-10-CM | POA: Diagnosis not present

## 2017-09-30 DIAGNOSIS — I5022 Chronic systolic (congestive) heart failure: Secondary | ICD-10-CM

## 2017-09-30 DIAGNOSIS — J069 Acute upper respiratory infection, unspecified: Secondary | ICD-10-CM | POA: Diagnosis not present

## 2017-09-30 DIAGNOSIS — J9622 Acute and chronic respiratory failure with hypercapnia: Secondary | ICD-10-CM | POA: Diagnosis not present

## 2017-09-30 DIAGNOSIS — R040 Epistaxis: Secondary | ICD-10-CM | POA: Diagnosis present

## 2017-09-30 DIAGNOSIS — Z9981 Dependence on supplemental oxygen: Secondary | ICD-10-CM

## 2017-09-30 DIAGNOSIS — N179 Acute kidney failure, unspecified: Secondary | ICD-10-CM | POA: Diagnosis not present

## 2017-09-30 DIAGNOSIS — Z7189 Other specified counseling: Secondary | ICD-10-CM | POA: Diagnosis not present

## 2017-09-30 DIAGNOSIS — M1990 Unspecified osteoarthritis, unspecified site: Secondary | ICD-10-CM | POA: Diagnosis present

## 2017-09-30 DIAGNOSIS — N189 Chronic kidney disease, unspecified: Secondary | ICD-10-CM | POA: Diagnosis not present

## 2017-09-30 DIAGNOSIS — G4733 Obstructive sleep apnea (adult) (pediatric): Secondary | ICD-10-CM | POA: Diagnosis present

## 2017-09-30 DIAGNOSIS — I482 Chronic atrial fibrillation: Secondary | ICD-10-CM | POA: Diagnosis present

## 2017-09-30 DIAGNOSIS — I5031 Acute diastolic (congestive) heart failure: Secondary | ICD-10-CM | POA: Diagnosis not present

## 2017-09-30 DIAGNOSIS — J9601 Acute respiratory failure with hypoxia: Secondary | ICD-10-CM | POA: Diagnosis not present

## 2017-09-30 DIAGNOSIS — Z7984 Long term (current) use of oral hypoglycemic drugs: Secondary | ICD-10-CM

## 2017-09-30 DIAGNOSIS — E785 Hyperlipidemia, unspecified: Secondary | ICD-10-CM | POA: Diagnosis present

## 2017-09-30 DIAGNOSIS — I4891 Unspecified atrial fibrillation: Secondary | ICD-10-CM | POA: Diagnosis present

## 2017-09-30 DIAGNOSIS — R748 Abnormal levels of other serum enzymes: Secondary | ICD-10-CM | POA: Diagnosis not present

## 2017-09-30 DIAGNOSIS — I4819 Other persistent atrial fibrillation: Secondary | ICD-10-CM

## 2017-09-30 DIAGNOSIS — I255 Ischemic cardiomyopathy: Secondary | ICD-10-CM | POA: Diagnosis not present

## 2017-09-30 DIAGNOSIS — R34 Anuria and oliguria: Secondary | ICD-10-CM | POA: Diagnosis not present

## 2017-09-30 DIAGNOSIS — J9611 Chronic respiratory failure with hypoxia: Secondary | ICD-10-CM | POA: Diagnosis not present

## 2017-09-30 DIAGNOSIS — Z888 Allergy status to other drugs, medicaments and biological substances status: Secondary | ICD-10-CM

## 2017-09-30 DIAGNOSIS — T502X5A Adverse effect of carbonic-anhydrase inhibitors, benzothiadiazides and other diuretics, initial encounter: Secondary | ICD-10-CM | POA: Diagnosis not present

## 2017-09-30 DIAGNOSIS — I2510 Atherosclerotic heart disease of native coronary artery without angina pectoris: Secondary | ICD-10-CM | POA: Diagnosis not present

## 2017-09-30 DIAGNOSIS — J449 Chronic obstructive pulmonary disease, unspecified: Secondary | ICD-10-CM

## 2017-09-30 DIAGNOSIS — Z79899 Other long term (current) drug therapy: Secondary | ICD-10-CM

## 2017-09-30 DIAGNOSIS — Z7901 Long term (current) use of anticoagulants: Secondary | ICD-10-CM

## 2017-09-30 DIAGNOSIS — J811 Chronic pulmonary edema: Secondary | ICD-10-CM

## 2017-09-30 DIAGNOSIS — Z880 Allergy status to penicillin: Secondary | ICD-10-CM

## 2017-09-30 DIAGNOSIS — I270 Primary pulmonary hypertension: Secondary | ICD-10-CM | POA: Diagnosis not present

## 2017-09-30 DIAGNOSIS — I2721 Secondary pulmonary arterial hypertension: Secondary | ICD-10-CM | POA: Diagnosis present

## 2017-09-30 DIAGNOSIS — E1122 Type 2 diabetes mellitus with diabetic chronic kidney disease: Secondary | ICD-10-CM | POA: Diagnosis not present

## 2017-09-30 DIAGNOSIS — Z95828 Presence of other vascular implants and grafts: Secondary | ICD-10-CM

## 2017-09-30 DIAGNOSIS — Z90710 Acquired absence of both cervix and uterus: Secondary | ICD-10-CM

## 2017-09-30 DIAGNOSIS — R609 Edema, unspecified: Secondary | ICD-10-CM

## 2017-09-30 DIAGNOSIS — G894 Chronic pain syndrome: Secondary | ICD-10-CM | POA: Diagnosis present

## 2017-09-30 DIAGNOSIS — I132 Hypertensive heart and chronic kidney disease with heart failure and with stage 5 chronic kidney disease, or end stage renal disease: Principal | ICD-10-CM | POA: Diagnosis present

## 2017-09-30 DIAGNOSIS — I480 Paroxysmal atrial fibrillation: Secondary | ICD-10-CM | POA: Diagnosis present

## 2017-09-30 DIAGNOSIS — E039 Hypothyroidism, unspecified: Secondary | ICD-10-CM | POA: Diagnosis not present

## 2017-09-30 DIAGNOSIS — I10 Essential (primary) hypertension: Secondary | ICD-10-CM

## 2017-09-30 DIAGNOSIS — D631 Anemia in chronic kidney disease: Secondary | ICD-10-CM | POA: Diagnosis present

## 2017-09-30 DIAGNOSIS — M6281 Muscle weakness (generalized): Secondary | ICD-10-CM | POA: Diagnosis not present

## 2017-09-30 DIAGNOSIS — N186 End stage renal disease: Secondary | ICD-10-CM | POA: Diagnosis not present

## 2017-09-30 DIAGNOSIS — J9621 Acute and chronic respiratory failure with hypoxia: Secondary | ICD-10-CM

## 2017-09-30 DIAGNOSIS — I5042 Chronic combined systolic (congestive) and diastolic (congestive) heart failure: Secondary | ICD-10-CM | POA: Diagnosis not present

## 2017-09-30 DIAGNOSIS — J432 Centrilobular emphysema: Secondary | ICD-10-CM | POA: Diagnosis not present

## 2017-09-30 DIAGNOSIS — I5023 Acute on chronic systolic (congestive) heart failure: Secondary | ICD-10-CM | POA: Diagnosis not present

## 2017-09-30 DIAGNOSIS — I493 Ventricular premature depolarization: Secondary | ICD-10-CM | POA: Diagnosis present

## 2017-09-30 DIAGNOSIS — I959 Hypotension, unspecified: Secondary | ICD-10-CM | POA: Diagnosis not present

## 2017-09-30 DIAGNOSIS — Z8249 Family history of ischemic heart disease and other diseases of the circulatory system: Secondary | ICD-10-CM

## 2017-09-30 DIAGNOSIS — I890 Lymphedema, not elsewhere classified: Secondary | ICD-10-CM | POA: Diagnosis not present

## 2017-09-30 DIAGNOSIS — Z87891 Personal history of nicotine dependence: Secondary | ICD-10-CM

## 2017-09-30 DIAGNOSIS — Z950 Presence of cardiac pacemaker: Secondary | ICD-10-CM

## 2017-09-30 DIAGNOSIS — E875 Hyperkalemia: Secondary | ICD-10-CM | POA: Diagnosis not present

## 2017-09-30 DIAGNOSIS — J9610 Chronic respiratory failure, unspecified whether with hypoxia or hypercapnia: Secondary | ICD-10-CM | POA: Diagnosis not present

## 2017-09-30 DIAGNOSIS — R079 Chest pain, unspecified: Secondary | ICD-10-CM | POA: Diagnosis not present

## 2017-09-30 DIAGNOSIS — J441 Chronic obstructive pulmonary disease with (acute) exacerbation: Secondary | ICD-10-CM | POA: Diagnosis not present

## 2017-09-30 DIAGNOSIS — N183 Chronic kidney disease, stage 3 (moderate): Secondary | ICD-10-CM | POA: Diagnosis not present

## 2017-09-30 DIAGNOSIS — Z515 Encounter for palliative care: Secondary | ICD-10-CM | POA: Diagnosis not present

## 2017-09-30 DIAGNOSIS — Z86711 Personal history of pulmonary embolism: Secondary | ICD-10-CM

## 2017-09-30 DIAGNOSIS — Z992 Dependence on renal dialysis: Secondary | ICD-10-CM | POA: Diagnosis not present

## 2017-09-30 DIAGNOSIS — I110 Hypertensive heart disease with heart failure: Secondary | ICD-10-CM | POA: Diagnosis not present

## 2017-09-30 DIAGNOSIS — Z886 Allergy status to analgesic agent status: Secondary | ICD-10-CM

## 2017-09-30 DIAGNOSIS — I214 Non-ST elevation (NSTEMI) myocardial infarction: Secondary | ICD-10-CM | POA: Diagnosis not present

## 2017-09-30 DIAGNOSIS — I272 Pulmonary hypertension, unspecified: Secondary | ICD-10-CM | POA: Diagnosis not present

## 2017-09-30 DIAGNOSIS — I361 Nonrheumatic tricuspid (valve) insufficiency: Secondary | ICD-10-CM | POA: Diagnosis not present

## 2017-09-30 DIAGNOSIS — R0602 Shortness of breath: Secondary | ICD-10-CM | POA: Diagnosis not present

## 2017-09-30 DIAGNOSIS — I5041 Acute combined systolic (congestive) and diastolic (congestive) heart failure: Secondary | ICD-10-CM | POA: Diagnosis not present

## 2017-09-30 DIAGNOSIS — N182 Chronic kidney disease, stage 2 (mild): Secondary | ICD-10-CM | POA: Diagnosis not present

## 2017-09-30 DIAGNOSIS — E871 Hypo-osmolality and hyponatremia: Secondary | ICD-10-CM | POA: Diagnosis not present

## 2017-09-30 DIAGNOSIS — I5033 Acute on chronic diastolic (congestive) heart failure: Secondary | ICD-10-CM | POA: Diagnosis not present

## 2017-09-30 DIAGNOSIS — I340 Nonrheumatic mitral (valve) insufficiency: Secondary | ICD-10-CM | POA: Diagnosis present

## 2017-09-30 DIAGNOSIS — E876 Hypokalemia: Secondary | ICD-10-CM | POA: Diagnosis present

## 2017-09-30 DIAGNOSIS — I5021 Acute systolic (congestive) heart failure: Secondary | ICD-10-CM | POA: Diagnosis not present

## 2017-09-30 DIAGNOSIS — R600 Localized edema: Secondary | ICD-10-CM | POA: Diagnosis not present

## 2017-09-30 DIAGNOSIS — Z6841 Body Mass Index (BMI) 40.0 and over, adult: Secondary | ICD-10-CM | POA: Diagnosis not present

## 2017-09-30 DIAGNOSIS — Z5189 Encounter for other specified aftercare: Secondary | ICD-10-CM | POA: Diagnosis not present

## 2017-09-30 DIAGNOSIS — J810 Acute pulmonary edema: Secondary | ICD-10-CM | POA: Diagnosis not present

## 2017-09-30 DIAGNOSIS — E782 Mixed hyperlipidemia: Secondary | ICD-10-CM

## 2017-09-30 DIAGNOSIS — Z825 Family history of asthma and other chronic lower respiratory diseases: Secondary | ICD-10-CM

## 2017-09-30 DIAGNOSIS — Z8673 Personal history of transient ischemic attack (TIA), and cerebral infarction without residual deficits: Secondary | ICD-10-CM

## 2017-09-30 DIAGNOSIS — E119 Type 2 diabetes mellitus without complications: Secondary | ICD-10-CM | POA: Diagnosis not present

## 2017-09-30 LAB — CBC
HCT: 32.7 % — ABNORMAL LOW (ref 35.0–47.0)
Hemoglobin: 10.3 g/dL — ABNORMAL LOW (ref 12.0–16.0)
MCH: 26.9 pg (ref 26.0–34.0)
MCHC: 31.6 g/dL — ABNORMAL LOW (ref 32.0–36.0)
MCV: 85.1 fL (ref 80.0–100.0)
Platelets: 175 10*3/uL (ref 150–440)
RBC: 3.84 MIL/uL (ref 3.80–5.20)
RDW: 17.1 % — ABNORMAL HIGH (ref 11.5–14.5)
WBC: 7.8 10*3/uL (ref 3.6–11.0)

## 2017-09-30 LAB — TROPONIN I: Troponin I: 0.04 ng/mL (ref <0.03)

## 2017-09-30 LAB — BRAIN NATRIURETIC PEPTIDE: B Natriuretic Peptide: 1151 pg/mL — ABNORMAL HIGH (ref 0.0–100.0)

## 2017-09-30 LAB — BASIC METABOLIC PANEL
Anion gap: 8 (ref 5–15)
BUN: 28 mg/dL — ABNORMAL HIGH (ref 6–20)
CO2: 28 mmol/L (ref 22–32)
Calcium: 9.2 mg/dL (ref 8.9–10.3)
Chloride: 104 mmol/L (ref 101–111)
Creatinine, Ser: 1.29 mg/dL — ABNORMAL HIGH (ref 0.44–1.00)
GFR calc Af Amer: 46 mL/min — ABNORMAL LOW (ref >60)
GFR calc non Af Amer: 40 mL/min — ABNORMAL LOW (ref >60)
Glucose, Bld: 146 mg/dL — ABNORMAL HIGH (ref 65–99)
Potassium: 3.3 mmol/L — ABNORMAL LOW (ref 3.5–5.1)
Sodium: 140 mmol/L (ref 135–145)

## 2017-09-30 LAB — GLUCOSE, CAPILLARY: Glucose-Capillary: 134 mg/dL — ABNORMAL HIGH (ref 65–99)

## 2017-09-30 LAB — HEMOGLOBIN A1C
Hgb A1c MFr Bld: 5.8 % — ABNORMAL HIGH (ref 4.8–5.6)
Mean Plasma Glucose: 119.76 mg/dL

## 2017-09-30 LAB — MAGNESIUM: Magnesium: 1.3 mg/dL — ABNORMAL LOW (ref 1.7–2.4)

## 2017-09-30 MED ORDER — INSULIN ASPART 100 UNIT/ML ~~LOC~~ SOLN
0.0000 [IU] | Freq: Three times a day (TID) | SUBCUTANEOUS | Status: DC
  Administered 2017-10-01 (×2): 4 [IU] via SUBCUTANEOUS
  Administered 2017-10-02: 7 [IU] via SUBCUTANEOUS
  Administered 2017-10-02: 4 [IU] via SUBCUTANEOUS
  Administered 2017-10-02: 3 [IU] via SUBCUTANEOUS
  Administered 2017-10-03: 4 [IU] via SUBCUTANEOUS
  Administered 2017-10-03: 3 [IU] via SUBCUTANEOUS
  Administered 2017-10-03 – 2017-10-04 (×2): 7 [IU] via SUBCUTANEOUS
  Administered 2017-10-04 (×2): 11 [IU] via SUBCUTANEOUS
  Administered 2017-10-05: 7 [IU] via SUBCUTANEOUS
  Administered 2017-10-05: 15 [IU] via SUBCUTANEOUS
  Administered 2017-10-05: 7 [IU] via SUBCUTANEOUS
  Administered 2017-10-06: 11 [IU] via SUBCUTANEOUS
  Administered 2017-10-06: 7 [IU] via SUBCUTANEOUS
  Administered 2017-10-06: 11 [IU] via SUBCUTANEOUS
  Filled 2017-09-30 (×17): qty 1

## 2017-09-30 MED ORDER — ACETAMINOPHEN 325 MG PO TABS
650.0000 mg | ORAL_TABLET | ORAL | Status: DC | PRN
  Administered 2017-09-30 – 2017-10-19 (×5): 650 mg via ORAL
  Filled 2017-09-30 (×4): qty 2

## 2017-09-30 MED ORDER — ONDANSETRON HCL 4 MG PO TABS: 8.0000 mg | ORAL_TABLET | Freq: Three times a day (TID) | ORAL | Status: DC | PRN

## 2017-09-30 MED ORDER — ALPRAZOLAM 0.25 MG PO TABS
0.2500 mg | ORAL_TABLET | Freq: Two times a day (BID) | ORAL | Status: DC | PRN
  Administered 2017-10-08 – 2017-10-16 (×3): 0.25 mg via ORAL
  Filled 2017-09-30 (×3): qty 1

## 2017-09-30 MED ORDER — POTASSIUM CHLORIDE CRYS ER 20 MEQ PO TBCR
40.0000 meq | EXTENDED_RELEASE_TABLET | Freq: Every day | ORAL | Status: DC
  Administered 2017-10-01 – 2017-10-02 (×2): 40 meq via ORAL
  Filled 2017-09-30 (×2): qty 2

## 2017-09-30 MED ORDER — APIXABAN 5 MG PO TABS
5.0000 mg | ORAL_TABLET | Freq: Two times a day (BID) | ORAL | Status: DC
  Administered 2017-09-30 – 2017-10-03 (×7): 5 mg via ORAL
  Filled 2017-09-30 (×7): qty 1

## 2017-09-30 MED ORDER — ALBUTEROL SULFATE (2.5 MG/3ML) 0.083% IN NEBU
2.5000 mg | INHALATION_SOLUTION | Freq: Four times a day (QID) | RESPIRATORY_TRACT | Status: DC
  Administered 2017-09-30 – 2017-10-07 (×27): 2.5 mg via RESPIRATORY_TRACT
  Filled 2017-09-30 (×28): qty 3

## 2017-09-30 MED ORDER — FUROSEMIDE 10 MG/ML IJ SOLN
40.0000 mg | Freq: Three times a day (TID) | INTRAMUSCULAR | Status: DC
  Administered 2017-10-01 – 2017-10-03 (×7): 40 mg via INTRAVENOUS
  Filled 2017-09-30 (×8): qty 4

## 2017-09-30 MED ORDER — ALBUTEROL SULFATE (2.5 MG/3ML) 0.083% IN NEBU: 2.5000 mg | INHALATION_SOLUTION | Freq: Four times a day (QID) | RESPIRATORY_TRACT | Status: DC

## 2017-09-30 MED ORDER — PROMETHAZINE HCL 25 MG PO TABS: 25.0000 mg | ORAL_TABLET | ORAL | Status: DC | PRN

## 2017-09-30 MED ORDER — SPIRONOLACTONE 25 MG PO TABS
25.0000 mg | ORAL_TABLET | Freq: Every day | ORAL | Status: DC
  Administered 2017-10-01 – 2017-10-03 (×3): 25 mg via ORAL
  Filled 2017-09-30 (×3): qty 1

## 2017-09-30 MED ORDER — SODIUM CHLORIDE 0.9% FLUSH
3.0000 mL | Freq: Two times a day (BID) | INTRAVENOUS | Status: DC
  Administered 2017-09-30 – 2017-10-20 (×22): 3 mL via INTRAVENOUS

## 2017-09-30 MED ORDER — ONDANSETRON HCL 4 MG/2ML IJ SOLN
4.0000 mg | Freq: Four times a day (QID) | INTRAMUSCULAR | Status: DC | PRN
Start: 2017-09-30 — End: 2017-10-22
  Administered 2017-10-03: 4 mg via INTRAVENOUS
  Filled 2017-09-30: qty 2

## 2017-09-30 MED ORDER — ASPIRIN EC 81 MG PO TBEC
81.0000 mg | DELAYED_RELEASE_TABLET | Freq: Every day | ORAL | Status: DC
  Administered 2017-10-07: 81 mg via ORAL
  Filled 2017-09-30 (×6): qty 1

## 2017-09-30 MED ORDER — LOSARTAN POTASSIUM 25 MG PO TABS: 25.0000 mg | ORAL_TABLET | Freq: Every day | ORAL | Status: DC

## 2017-09-30 MED ORDER — METOPROLOL SUCCINATE ER 100 MG PO TB24
200.0000 mg | ORAL_TABLET | Freq: Every day | ORAL | Status: DC
  Administered 2017-10-01 – 2017-10-03 (×3): 200 mg via ORAL
  Filled 2017-09-30 (×3): qty 2

## 2017-09-30 MED ORDER — SODIUM CHLORIDE 0.9% FLUSH: 3.0000 mL | INTRAVENOUS | Status: DC | PRN

## 2017-09-30 MED ORDER — ROSUVASTATIN CALCIUM 10 MG PO TABS
10.0000 mg | ORAL_TABLET | Freq: Every day | ORAL | Status: DC
  Administered 2017-10-01 – 2017-10-22 (×22): 10 mg via ORAL
  Filled 2017-09-30 (×22): qty 1

## 2017-09-30 MED ORDER — METOLAZONE 5 MG PO TABS
5.0000 mg | ORAL_TABLET | Freq: Every day | ORAL | Status: AC
  Administered 2017-09-30 – 2017-10-01 (×2): 5 mg via ORAL
  Filled 2017-09-30 (×2): qty 1

## 2017-09-30 MED ORDER — POTASSIUM CHLORIDE 20 MEQ PO PACK
40.0000 meq | PACK | Freq: Once | ORAL | Status: AC
  Administered 2017-09-30: 40 meq via ORAL
  Filled 2017-09-30: qty 2

## 2017-09-30 MED ORDER — SILDENAFIL CITRATE 20 MG PO TABS
20.0000 mg | ORAL_TABLET | Freq: Three times a day (TID) | ORAL | Status: DC
  Administered 2017-09-30 – 2017-10-19 (×56): 20 mg via ORAL
  Filled 2017-09-30 (×63): qty 1

## 2017-09-30 MED ORDER — PREMIER PROTEIN SHAKE
11.0000 [oz_av] | Freq: Two times a day (BID) | ORAL | Status: DC
Start: 2017-10-01 — End: 2017-10-08
  Administered 2017-10-01 – 2017-10-07 (×6): 11 [oz_av] via ORAL

## 2017-09-30 MED ORDER — FUROSEMIDE 10 MG/ML IJ SOLN
80.0000 mg | Freq: Once | INTRAMUSCULAR | Status: AC
  Administered 2017-09-30: 80 mg via INTRAVENOUS
  Filled 2017-09-30: qty 8

## 2017-09-30 MED ORDER — MAGNESIUM SULFATE 2 GM/50ML IV SOLN
2.0000 g | Freq: Once | INTRAVENOUS | Status: AC
  Administered 2017-09-30: 2 g via INTRAVENOUS
  Filled 2017-09-30: qty 50

## 2017-09-30 MED ORDER — LOSARTAN POTASSIUM 25 MG PO TABS
25.0000 mg | ORAL_TABLET | Freq: Every day | ORAL | Status: DC
  Administered 2017-10-01 – 2017-10-03 (×3): 25 mg via ORAL
  Filled 2017-09-30 (×3): qty 1

## 2017-09-30 MED ORDER — INSULIN ASPART 100 UNIT/ML ~~LOC~~ SOLN
0.0000 [IU] | Freq: Every day | SUBCUTANEOUS | Status: DC
  Administered 2017-10-03 – 2017-10-05 (×3): 4 [IU] via SUBCUTANEOUS
  Administered 2017-10-06: 3 [IU] via SUBCUTANEOUS
  Filled 2017-09-30 (×4): qty 1

## 2017-09-30 MED ORDER — OXYCODONE-ACETAMINOPHEN 5-325 MG PO TABS
1.0000 | ORAL_TABLET | Freq: Three times a day (TID) | ORAL | Status: DC | PRN
Start: 2017-09-30 — End: 2017-10-08
  Administered 2017-10-07: 1 via ORAL
  Filled 2017-09-30: qty 1

## 2017-09-30 MED ORDER — SODIUM CHLORIDE 0.9 % IV SOLN
250.0000 mL | INTRAVENOUS | Status: DC | PRN
  Administered 2017-10-12: 08:00:00 via INTRAVENOUS

## 2017-09-30 MED ORDER — GABAPENTIN 100 MG PO CAPS
200.0000 mg | ORAL_CAPSULE | Freq: Three times a day (TID) | ORAL | Status: DC
  Administered 2017-09-30 – 2017-10-07 (×20): 200 mg via ORAL
  Filled 2017-09-30 (×20): qty 2

## 2017-09-30 NOTE — ED Provider Notes (Signed)
[facility] Emergency Department Provider Note    First MD Initiated Contact with Patient 09/30/17 1618     (approximate)  I have reviewed the triage vital signs and the nursing notes.   HISTORY  Chief Complaint Shortness of Breath    HPI Debra Saunders is a 72 y.o. 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  Has been taking diuretics as prescribed.  Denies any chest pain at this time.  No fevers.  No recent changes to any medications.  Does feel like her abdomen is more distended and worsening lower extremity swelling.  Past Medical History:  Diagnosis Date  . Arthritis   . Atrial fibrillation, persistent ([place])    a. s/p TEE-guided DCCV on 08/20/2017  . Chest pain    a. 01/2012 Cath: nonobs dzs;  c. 04/2014 MV: No ischemia.  . Chronic back pain   . Chronic combined systolic (congestive) and diastolic (congestive) heart failure ([place])    a. Dx 2005 @ [facility];  b. 02/2014 Echo: Ef 55-60%, no rwma, mild MR, PASP 62mmHg. c. 07/2017: EF 30-35%, diffuse HK, trivial AI, severe MR, moderate TR  . Chronic respiratory failure (HCC)    a. on home O2  . COPD (chronic obstructive pulmonary disease) ([place])    a. Home O2 - 3lpm  . Gastritis   . History of intracranial hemorrhage    a. 6/14 described by neurosurgery as "small frontal posttraumatic SAH " - pt was on xarelto @ the time.  . Hyperlipidemia   . Hypertension   . Lipoma of back   . Lymphedema    a. Chronic LLE edema.  . Obesity   . Sepsis with metabolic encephalopathy ([place])   . Sleep apnea   . Streptococcal bacteremia   . Stroke ([place])   . Symptomatic bradycardia    a. s/p BSX PPM.  . Thyroid nodule   . Type II diabetes mellitus ([place])   . Urinary incontinence    Family  History  Problem Relation Age of Onset  . Heart disease Mother   . Hypertension Mother   . COPD Mother        was a smoker  . Thyroid disease Mother   . Hypertension Father   . Hypertension Sister   . Thyroid disease Sister   . Hypertension Sister   . Thyroid disease Sister   . Breast cancer Maternal Aunt   . Kidney disease Neg Hx   . Bladder Cancer Neg Hx    Past Surgical History:  Procedure Laterality Date  . ABDOMINAL HYSTERECTOMY  1969  . CARDIAC CATHETERIZATION  2013   @ [place]: No obstructive CAD: Only 20% ostial left circumflex.  . CHOLECYSTECTOMY    . CYST REMOVAL TRUNK  2002   BACK  . INSERT / REPLACE / REMOVE PACEMAKER    . lipoma removal  2014   back  . PACEMAKER INSERTION  [address] Scientific dual chamber pacemaker implanted by Dr Caryl Comes for symptomatic bradycardia  . PERMANENT PACEMAKER INSERTION N/A 01/09/2014   Procedure: PERMANENT PACEMAKER INSERTION;  Surgeon: Deboraha Sprang, MD;  Location: [facility] CATH LAB;  Service: Cardiovascular;  Laterality: N/A;  . TEE WITHOUT CARDIOVERSION N/A 08/20/2017   Procedure: TRANSESOPHAGEAL ECHOCARDIOGRAM (TEE) & Direct current cardioversion;  Surgeon: Ida Rogue  J, MD;  Location: [facility] ORS;  Service: Cardiovascular;  Laterality: N/A;  . TRIGGER FINGER RELEASE    . VAGINAL HYSTERECTOMY     Patient Active Problem List   Diagnosis Date Noted  . Atrial fibrillation ([place]) 09/22/2017  . Acute bilateral low back pain without sciatica 01/28/2017  . Irregular heart beat 07/23/2016  . Pain in limb 07/22/2016  . Neuropathy 06/09/2016  . Pain in the chest 03/10/2016  . UTI (urinary tract infection) 03/03/2016  . Chronic deep vein thrombosis (DVT) (HCC) 03/03/2016  . Chronic back pain 10/19/2015  . Goiter 04/25/2015  . Cervical disc disorder with radiculopathy of cervical region 03/30/2015  . Elevated LFTs 06/29/2014  . 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 Abnormality of gait 01/13/2014  . Sinoatrial node dysfunction  ([place]) 01/09/2014  . Allergic rhinitis 10/26/2013  . Lymphedema 08/03/2013  . Constipation 04/12/2013  . Pulmonary embolus ([place]) 01/19/2013  . Subarachnoid hemorrhage ([place]) 01/19/2013  . Musculoskeletal chest pain 01/19/2013  . Preop cardiovascular exam 10/19/2012  . Anemia 04/28/2012  . Chronic respiratory failure with hypoxia ([place]) 04/26/2012  . Diabetes ([place]) 03/30/2012  . Pulmonary hypertension ([place]) 03/30/2012  . Hyperlipidemia 03/30/2012  . Screening for breast cancer 03/30/2012  . Hypothyroidism 03/30/2012  . Chronic systolic heart failure ([place]) 01/22/2012  . Hypertension       Prior to Admission medications   Medication Sig Start Date End Date Taking? Authorizing Provider  acetaminophen (TYLENOL) 325 MG tablet Take 325-650 mg by mouth every 6 (six) hours as needed for mild pain. Reported on 10/10/2015    [provider]  albuterol (PROVENTIL) (2.5 MG/3ML) 0.083% nebulizer solution Take 3 mLs (2.5 mg total) by nebulization every 6 (six) hours. 08/28/17   Loletha Grayer, MD  apixaban (ELIQUIS) 5 MG TABS tablet Take 1 tablet (5 mg total) by mouth 2 (two) times daily. 09/23/17   [name], MD  Dulaglutide ([name]) 2.26 JF/3.5KT SOPN Inject 0.75 mg into the skin once a week. 12/29/16   [name], MD  gabapentin (NEURONTIN) 100 MG capsule TAKE 2 CAPSULES BY MOUTH THREE TIMES DAILY 08/12/17   [name], MD  glucose blood (TRUE METRIX BLOOD GLUCOSE TEST) test strip Check blood glucose twice daily Dx:E11.8, Z79.4 09/17/17   [name], MD  losartan (COZAAR) 25 MG tablet Take 1 tablet (25 mg total) by mouth daily. 08/29/17   Loletha Grayer, MD  Menthol, Topical Analgesic, (BIOFREEZE EX) Apply 1 application topically as needed (for pain).     [provider]  metoprolol (TOPROL-XL) 200 MG 24 hr tablet Take 1 tablet (200 mg total) by mouth daily. Take with or immediately following a meal. 09/23/17   [name], MD  ondansetron  (ZOFRAN) 8 MG tablet Take 1 tablet (8 mg total) by mouth every 8 (eight) hours as needed for nausea or vomiting. 09/25/14   Jackolyn Confer, MD  oxyCODONE-acetaminophen (PERCOCET/ROXICET) 5-325 MG tablet Take 1 tablet by mouth every 8 (eight) hours as needed for severe pain. 08/28/17   Loletha Grayer, MD  OXYGEN Inhale 4 L into the lungs daily.     [provider]  potassium chloride SA (K-DUR,KLOR-CON) 20 MEQ tablet Take 1 tablet (20 mEq total) by mouth daily. 08/28/17   Loletha Grayer, MD  promethazine (PHENERGAN) 25 MG tablet Take 25 mg by mouth every 4 (four) hours as needed for nausea or vomiting. Reported on 10/10/2015    [provider]  protein supplement shake (PREMIER PROTEIN)  LIQD Take 325 mLs (11 oz total) by mouth 2 (two) times daily between meals. 08/28/17   Loletha Grayer, MD  rosuvastatin (CRESTOR) 10 MG tablet Take 1 tablet (10 mg total) by mouth daily. 12/29/16   [name], MD  sildenafil (REVATIO) 20 MG tablet Take 20 mg by mouth 3 (three) times daily. 05/20/17   [provider]  spironolactone (ALDACTONE) 25 MG tablet Take 0.5 tablets (12.5 mg total) by mouth daily. 08/28/17   Loletha Grayer, MD  torsemide (DEMADEX) 20 MG tablet Take 1 tablet (20 mg total) by mouth daily. 09/14/17 12/13/17  Erma Heritage, PA-C    Allergies Aspirin; Other; and Penicillins    Social History Social History   Tobacco Use  . Smoking status: Former Smoker    Packs/day: 0.30    Years: 2.00    Pack years: 0.60    Types: Cigarettes    Last attempt to quit: 06/23/1990    Years since quitting: 27.2  . Smokeless tobacco: Never Used  Substance Use Topics  . Alcohol use: No  . Drug use: No    Review of Systems Patient denies headaches, rhinorrhea, blurry vision, numbness, shortness of breath, chest pain, edema, cough, abdominal pain, nausea, vomiting, diarrhea, dysuria, fevers, rashes or hallucinations unless otherwise stated above in  HPI. ____________________________________________   PHYSICAL EXAM:  VITAL SIGNS: Vitals:   09/30/17 1532  BP: (!) 140/105  Pulse: 93  Resp: 20  Temp: 98.8 F (37.1 C)  SpO2: 95%    Constitutional: Alert and oriented. Chronically ill appearing and in no acute distress. Eyes: Conjunctivae are normal.  Head: Atraumatic. Nose: No congestion/rhinnorhea. Mouth/Throat: Mucous membranes are moist.   Neck: No stridor. Painless ROM.  Cardiovascular: Normal rate, irregular rhythm. Grossly normal heart sounds.  Good peripheral circulation. Respiratory: Normal respiratory effort.  No retractions. Prolonged expiratory phase with occasional wheeze anteriorly, diminished bibasilar breathsounds Gastrointestinal: Soft and nontender. No distention. No abdominal bruits. No CVA tenderness. Genitourinary:  Musculoskeletal: No lower extremity tenderness nor edema.  No joint effusions. Neurologic:  Normal speech and language. No gross focal neurologic deficits are appreciated. No facial droop Skin:  Skin is warm, dry and intact. No rash noted. Psychiatric: Mood and affect are normal. Speech and behavior are normal.  ____________________________________________   LABS (all labs ordered are listed, but only abnormal results are displayed)  Results for orders placed or performed during the hospital encounter of 09/30/17 (from the past 24 hour(s))  Basic metabolic panel     Status: Abnormal   Collection Time: 09/30/17  3:45 PM  Result Value Ref Range   Sodium 140 135 - 145 mmol/L   Potassium 3.3 (L) 3.5 - 5.1 mmol/L   Chloride 104 101 - 111 mmol/L   CO2 28 22 - 32 mmol/L   Glucose, Bld 146 (H) 65 - 99 mg/dL   BUN 28 (H) 6 - 20 mg/dL   Creatinine, Ser 1.29 (H) 0.44 - 1.00 mg/dL   Calcium 9.2 8.9 - 10.3 mg/dL   GFR calc non Af Amer 40 (L) >60 mL/min   GFR calc Af Amer 46 (L) >60 mL/min   Anion gap 8 5 - 15  CBC     Status: Abnormal   Collection Time: 09/30/17  3:45 PM  Result Value Ref Range    WBC 7.8 3.6 - 11.0 K/uL   RBC 3.84 3.80 - 5.20 MIL/uL   Hemoglobin 10.3 (L) 12.0 - 16.0 g/dL   HCT 32.7 (L) 35.0 - 47.0 %  MCV 85.1 80.0 - 100.0 fL   MCH 26.9 26.0 - 34.0 pg   MCHC 31.6 (L) 32.0 - 36.0 g/dL   RDW 17.1 (H) 11.5 - 14.5 %   Platelets 175 150 - 440 K/uL  Troponin I     Status: Abnormal   Collection Time: 09/30/17  3:45 PM  Result Value Ref Range   Troponin I 0.04 (HH) <0.03 ng/mL   ____________________________________________  EKG My review and personal interpretation at Time: 15:39   Indication: sob  Rate: 95  Rhythm: afib Axis: normal Other: wandering baseline limits interpretation, no stemi ____________________________________________  RADIOLOGY  I personally reviewed all radiographic images ordered to evaluate for the above acute complaints and reviewed radiology reports and findings.  These findings were personally discussed with the patient.  Please see medical record for radiology report.  ____________________________________________   PROCEDURES  Procedure(s) performed:  Procedures    Critical Care performed: no ____________________________________________   INITIAL IMPRESSION / ASSESSMENT AND PLAN / ED COURSE  Pertinent labs & imaging results that were available during my care of the patient were reviewed by me and considered in my medical decision making (see chart for details).  DDX: chf, anasarca, ckd, pna, copd  Debra Saunders is a 72 y.o. who presents to the ED with evidence of CHF exacerbation syndrome cardiology clinic.  EKG shows no evidence of acute ischemia.  Mildly elevated troponin likely secondary to underlying CHF.  Given her significant weight gain as well as AK I failing outpatient management will start the patient on IV Lasix for diuresis and discussed case with hospitalist for admission.     As part of my medical decision making, I reviewed the following data within the [place] notes reviewed  and incorporated, Labs reviewed, notes from prior ED visits and [place] Controlled Substance Database   ____________________________________________   FINAL CLINICAL IMPRESSION(S) / ED DIAGNOSES  Final diagnoses:  Acute on chronic combined systolic and diastolic congestive heart failure (HCC)  Acute on chronic respiratory failure with hypoxia ([place])      NEW MEDICATIONS STARTED DURING THIS VISIT:  New Prescriptions   No medications on file     Note:  This document was prepared using Dragon voice recognition software and may include unintentional dictation errors.    Merlyn Lot, MD 09/30/17 1640

## 2017-09-30 NOTE — Progress Notes (Signed)
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  Past Medical History    Past Medical History:  Diagnosis Date  . Arthritis   . Atrial fibrillation, persistent ([place])    a. s/p TEE-guided DCCV on 08/20/2017  . Chest pain    a. 01/2012 Cath: nonobs dzs;  c. 04/2014 MV: No ischemia.  . Chronic back pain   . Chronic combined systolic (congestive) and diastolic (congestive) heart failure ([place])    a. Dx 2005 @ [facility];  b. 02/2014 Echo: Ef 55-60%, no rwma, mild MR, PASP 45mmHg. c. 07/2017: EF 30-35%, diffuse HK, trivial AI, severe MR, moderate TR  . Chronic respiratory failure ([facility])    a. on home O2  . COPD (chronic obstructive pulmonary disease) ([place])    a. Home O2 - 3lpm  . Gastritis   . History of intracranial hemorrhage    a. 6/14 described by neurosurgery as "small frontal posttraumatic SAH " - pt was on xarelto @ the time.  . Hyperlipidemia   . Hypertension   . Lipoma of back   . Lymphedema    a. Chronic LLE edema.  . Obesity   . Sepsis with metabolic encephalopathy ([place])   . Sleep apnea   . Streptococcal bacteremia   . Stroke ([place])   . Symptomatic bradycardia    a. s/p BSX PPM.  . Thyroid nodule   .  Type II diabetes mellitus ([place])   . Urinary incontinence    Past Surgical History:  Procedure Laterality Date  . ABDOMINAL HYSTERECTOMY  1969  . CARDIAC CATHETERIZATION  2013   @ [place]: No obstructive CAD: Only 20% ostial left circumflex.  . CHOLECYSTECTOMY    . CYST REMOVAL TRUNK  2002   BACK  . INSERT / REPLACE / REMOVE PACEMAKER    . lipoma removal  2014   back  . PACEMAKER INSERTION  [address] Scientific dual chamber pacemaker implanted by Dr Caryl Comes for symptomatic bradycardia  . PERMANENT PACEMAKER INSERTION N/A 01/09/2014   Procedure: PERMANENT PACEMAKER INSERTION;  Surgeon: Deboraha Sprang, MD;  Location: [facility] CATH LAB;  Service: Cardiovascular;  Laterality: N/A;  . TEE WITHOUT CARDIOVERSION N/A 08/20/2017   Procedure: TRANSESOPHAGEAL ECHOCARDIOGRAM (TEE) & Direct current cardioversion;  Surgeon: Minna Merritts, MD;  Location: [facility] ORS;  Service: Cardiovascular;  Laterality: N/A;  . TRIGGER FINGER RELEASE    . VAGINAL HYSTERECTOMY      Allergies  Allergies  Allergen Reactions  . Aspirin Hives and Other (See Comments)    Pt states that she is unable to take because she had bleeding in her brain.    . Other Other (See Comments)    Pt states that she is unable to take blood thinners because she had bleeding in her brain.  Blood Thinners-per doctor at [facility]  . Penicillins Hives,  Shortness Of Breath, Swelling and Other (See Comments)    Has patient had a PCN reaction causing immediate rash, facial/tongue/throat swelling, SOB or lightheadedness with hypotension: Yes Has patient had a PCN reaction causing severe rash involving mucus membranes or skin necrosis: No Has patient had a PCN reaction that required hospitalization No Has patient had a PCN reaction occurring within the last 10 years: No If all of the above answers are "NO", then may proceed with Cephalosporin use.    History of Present Illness    72 year old female with the above complex past medical history  including nonobstructive CAD, persistent atrial fibrillation, chronic combined heart failure, symptomatic bradycardia status post pacemaker, frequent PVCs, chronic respiratory failure and hypoxia in the setting of COPD and requiring home O2, hypertension, hyperlipidemia, diabetes, small traumatic subarachnoid hemorrhage while on Xarelto, DVT status post IVC filter, chronic left lower extremity lymphedema, atypical chest pain, stroke, sleep apnea, and obesity.  She was last hospitalized in February 2019 with heart failure and A. fib with RVR.  During that admission, she was diuresed and after review of prior neurosurgical records, she was placed on Eliquis.  She later underwent TEE and cardioversion.  She did have some paroxysmal atrial fibrillation post cardioversion but was discharged home in sinus rhythm.  There was discussion about potential need for antiarrhythmic if she had recurrent A. fib.  [facility] and said she started feeling poorly again while she was there.  She was discharged home from there in early March and followed up here of March 25.  At that point, her weight was up and she was having dyspnea.  She was also back in A. fib, which she said was intermittent.  Her diuretics were adjusted and she was switched from Lasix to torsemide.  She noted reasonable output with torsemide but when she followed up in heart failure clinic on April 1, her weight was up another pound.  Torsemide was adjusted to 40 mg alternating with 20 mg every other day.  Unfortunately, she has continued to have progressive increase in volume and with this, she has been experiencing increasing bilateral lower extremity swelling, left greater than right, increasing abdominal girth, and 3+ pillow orthopnea.  She says last night she was so uncomfortable she had to sit up with her CPAP in place.  She has chronic mild chest tightness which has not really changed.  She remains in atrial fibrillation today.   She thinks that she goes in and out but is only out for brief periods of time.  Home Medications    Prior to Admission medications   Medication Sig Start Date End Date Taking? Authorizing Provider  acetaminophen (TYLENOL) 325 MG tablet Take 325-650 mg by mouth every 6 (six) hours as needed for mild pain. Reported on 10/10/2015   Yes [provider]  albuterol (PROVENTIL) (2.5 MG/3ML) 0.083% nebulizer solution Take 3 mLs (2.5 mg total) by nebulization every 6 (six) hours. 08/28/17  Yes Wieting, Richard, MD  apixaban (ELIQUIS) 5 MG TABS tablet Take 1 tablet (5 mg total) by mouth 2 (two) times daily. 09/23/17  Yes [name], MD  Dulaglutide ([name]) 8.56 DJ/4.9FW SOPN Inject 0.75 mg into the skin once a week. 12/29/16  Yes [name], MD  gabapentin (NEURONTIN) 100 MG capsule TAKE 2 CAPSULES BY MOUTH THREE TIMES DAILY 08/12/17  Yes [name], MD  glucose blood (TRUE METRIX BLOOD GLUCOSE TEST) test strip Check blood glucose twice daily Dx:E11.8, Z79.4  09/17/17  Yes [name], MD  losartan (COZAAR) 25 MG tablet Take 1 tablet (25 mg total) by mouth daily. 08/29/17  Yes Wieting, Richard, MD  Menthol, Topical Analgesic, (BIOFREEZE EX) Apply 1 application topically as needed (for pain).    Yes [provider]  metoprolol (TOPROL-XL) 200 MG 24 hr tablet Take 1 tablet (200 mg total) by mouth daily. Take with or immediately following a meal. 09/23/17  Yes [name], MD  ondansetron (ZOFRAN) 8 MG tablet Take 1 tablet (8 mg total) by mouth every 8 (eight) hours as needed for nausea or vomiting. 09/25/14  Yes Jackolyn Confer, MD  oxyCODONE-acetaminophen (PERCOCET/ROXICET) 5-325 MG tablet Take 1 tablet by mouth every 8 (eight) hours as needed for severe pain. 08/28/17  Yes Wieting, Richard, MD  OXYGEN Inhale 4 L into the lungs daily.    Yes [provider]  potassium chloride SA (K-DUR,KLOR-CON) 20 MEQ tablet Take 1 tablet (20 mEq total) by mouth daily.  08/28/17  Yes Wieting, Richard, MD  promethazine (PHENERGAN) 25 MG tablet Take 25 mg by mouth every 4 (four) hours as needed for nausea or vomiting. Reported on 10/10/2015   Yes [provider]  protein supplement shake (PREMIER PROTEIN) LIQD Take 325 mLs (11 oz total) by mouth 2 (two) times daily between meals. 08/28/17  Yes Wieting, Richard, MD  rosuvastatin (CRESTOR) 10 MG tablet Take 1 tablet (10 mg total) by mouth daily. 12/29/16  Yes [name], MD  sildenafil (REVATIO) 20 MG tablet Take 20 mg by mouth 3 (three) times daily. 05/20/17  Yes [provider]  spironolactone (ALDACTONE) 25 MG tablet Take 0.5 tablets (12.5 mg total) by mouth daily. 08/28/17  Yes Wieting, Richard, MD  torsemide (DEMADEX) 20 MG tablet Take 1 tablet (20 mg total) by mouth daily. 09/14/17 12/13/17 Yes Strader, Fransisco Hertz, PA-C    Family History    Family History  Problem Relation Age of Onset  . Heart disease Mother   . Hypertension Mother   . COPD Mother        was a smoker  . Thyroid disease Mother   . Hypertension Father   . Hypertension Sister   . Thyroid disease Sister   . Hypertension Sister   . Thyroid disease Sister   . Breast cancer Maternal Aunt   . Kidney disease Neg Hx   . Bladder Cancer Neg Hx    indicated that her mother is deceased. She indicated that her father is deceased. She indicated that both of her sisters are alive. She indicated that the status of her maternal aunt is unknown. She indicated that the status of her neg hx is unknown.  Social History    Social History   Socioeconomic History  . Marital status: Divorced    Spouse name: Not on file  . Number of children: Not on file  . Years of education: Not on file  . Highest education level: Not on file  Occupational History  . Occupation: Retired- factory work    Comment: exposed to [place]  . Financial resource strain: Not on file  . Food insecurity:    Worry: Not on file    Inability:  Not on file  . Transportation needs:    Medical: Not on file    Non-medical: Not on file  Tobacco Use  . Smoking status: Former Smoker    Packs/day: 0.30    Years: 2.00    Pack years: 0.60  Types: Cigarettes    Last attempt to quit: 06/23/1990    Years since quitting: 27.2  . Smokeless tobacco: Never Used  Substance and Sexual Activity  . Alcohol use: No  . Drug use: No  . Sexual activity: Not on file  Lifestyle  . Physical activity:    Days per week: Not on file    Minutes per session: Not on file  . Stress: Not on file  Relationships  . Social connections:    Talks on phone: Not on file    Gets together: Not on file    Attends religious service: Not on file    Active member of club or organization: Not on file    Attends meetings of clubs or organizations: Not on file    Relationship status: Not on file  . Intimate partner violence:    Fear of current or ex partner: Not on file    Emotionally abused: Not on file    Physically abused: Not on file    Forced sexual activity: Not on file  Other Topics Concern  . Not on file  Social History Narrative   Lives in [place] with daughter. Has 4 children. No pets.      Work - Restaurant manager, fast food, retired      Diet - regular     Review of Systems    General:  No chills, fever, night sweats or weight changes.  Cardiovascular:  +++  Chronic  mild chest pain,+++  dyspnea on exertion, +++ bilat L>Redema, +++ 2-3+ pillow orthopnea, +++ palpitations, +++ paroxysmal nocturnal dyspnea, +++ increase in abdominal girth.   Dermatological: No rash, lesions/masses Respiratory: No cough, +++ dyspnea with minimal activity or just talking. Urologic: No hematuria, dysuria Abdominal: Increased abdominal girth with diffuse tenderness. +++  No nausea, vomiting, diarrhea, bright red blood per rectum, melena, or hematemesis Neurologic:  No visual changes, wkns, changes in mental status. All other systems reviewed and are otherwise negative except  as noted above.  Physical Exam    VS:  BP 132/74 (BP Location: Right Arm, Patient Position: Sitting, Cuff Size: Large)   Pulse 93   Ht 5' (1.524 m)   Wt 235 lb 8 oz (106.8 kg)   SpO2 96%   BMI 45.99 kg/m  , BMI Body mass index is 45.99 kg/m. GEN: Obese, in no acute distress.  HEENT: normal.  Neck: Supple, obese, moderately elevated JVP, no carotid bruits, or masses. Cardiac: Irregularly irregular, distant heart sounds.  In that setting, I do not appreciate a murmur.  No rubs, or gallops. No clubbing, cyanosis.  Trace right lower extremity edema with 3+ left lower extremity edema extending up into the thigh.   Radials/DP/PT 2+ and equal bilaterally.  Respiratory:  Respirations regular and unlabored, clear to auscultation bilaterally. GI: Obese, soft, diffusely tender, nondistended, bowel sounds present x4.   MS: no deformity or atrophy. Skin: warm and dry, no rash. Neuro:  Strength and sensation are intact. Psych: Normal affect.  Accessory Clinical Findings    ECG -atrial fibrillation, 93, leftward axis, nonspecific T changes.  Assessment & Plan   1.  Acute on chronic combined systolic and diastolic congestive heart failure: In the setting of what I presume is more persistent atrial fibrillation over the past few weeks.  She has been noticing worsening dyspnea, orthopnea, increasing abdominal girth, and lower extremity edema ever since leaving the hospital in early March.  She is now 17 pounds above her discharge weight  despite outpatient attempts to  alter her diuretics.  In this setting, I am sending her to the emergency room for evaluation and admission.  She will need to be diuresed aggressively-recommend 80 mg of Lasix IV twice daily.  Once we have her volume status more stable, we will need to address her heart rhythm and plan to initiate antiarrhythmic therapy, as I do not think that she tolerates A. fib very well.  Continue beta-blocker, ARB, and Spironolactone.  2.  Persistent  atrial fibrillation: I suspect this is driving her heart failure to some extent despite reasonable rate control.  Initiation of antiarrhythmic therapy was previously discussed as potentially being necessary if she were to have recurrent A. fib.  It was previously felt that she may not be a good candidate for amiodarone in the setting of chronic lung disease.  The only other real option would be dofetilide.  We will discuss further with cardiology attendings.  Continue Eliquis.  She has not missed any doses.  Continue beta-blocker.  3.  Essential hypertension: Stable.  4.  Hyperlipidemia: Continue statin therapy.  5.  COPD: No active wheezing.  She is on chronic home O2.  Continue inhaler therapy as needed.  6.  Prior history of traumatic subarachnoid hemorrhage: Notes previously reviewed.  This occurred in 2014 while on Xarelto.  She has been tolerating Eliquis, which was initiated in February.  7.  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  ? fxnl in setting of afib and chf.  Poor surgical candidate.  ? Potential role of mitraclip in future.  8.  Disposition: Patient being sent to the emergency department now for admission.  I have spoken to the admitting physician Tressia Miners) and we will follow during inpatient stay.  Murray Hodgkins, NP 09/30/2017, 2:54 PM

## 2017-09-30 NOTE — ED Triage Notes (Addendum)
Pt to ED from [facility] with c/o increased SOB and weight gain of 17lbs over few weeks. Per pt pcp wants pt to be admitted for CHF exacerbation. VSS, pt on 4L  chronically. Edema to abdomen and lower extremities noted . PT in NAD at this time

## 2017-09-30 NOTE — Patient Instructions (Signed)
Medication Instructions: - Your physician recommends that you continue on your current medications as directed. Please refer to the Current Medication list given to you today.  Labwork: - none ordered  Procedures/Testing: - none ordered  Follow-Up: - to the ER for further evaluation today  Any Additional Special Instructions Will Be Listed Below (If Applicable).     If you need a refill on your cardiac medications before your next appointment, please call your pharmacy.

## 2017-09-30 NOTE — Progress Notes (Signed)
Family Meeting Note  Advance Directive:yes  Today a meeting took place with the Patient.  Patient is able to participate  The following clinical team members were present during this meeting:MD  The following were discussed:Patient's diagnosis: Chronic A. fib, chronic pain syndrome, congestive heart failure, chronic respiratory failure, intracranial hemorrhage, hypertension, lymphedema, obesity, obstructive sleep apnea, stroke, bradycardia status post pacer, diabetes, DVT, Patient's progosis: Unable to determine and Goals for treatment: Full Code  Additional follow-up to be provided: prn  Time spent during discussion:20 minutes  Gorden Harms, MD

## 2017-09-30 NOTE — H&P (Signed)
[facility] NAME: Debra Saunders    MR#:  [id number]  DATE OF BIRTH:  1945-10-06  DATE OF ADMISSION:  09/30/2017  PRIMARY CARE PHYSICIAN: [name], MD   REQUESTING/REFERRING PHYSICIAN:   CHIEF COMPLAINT:   Chief Complaint  Patient presents with  . Shortness of Breath    HISTORY OF PRESENT ILLNESS: Debra Saunders  is a 72 y.o. female with a known history per below presenting from her cardiologist's office/Dr. Velva Harman for acute on chronic systolic congestive heart failure exacerbation, weight gain of over 17 pounds, dyspnea on exertion, patient went to the emergency room for workup initially, in the emergency room patient was found to have congestive heart failure on x-ray, potassium 3.3, creatinine 1.3 with baseline normal, patient in no apparent distress, resting comfortably in bed, on chronic 4 L via nasal cannula, patient now being admitted for acute on chronic combined systolic diastolic congestive heart failure exacerbation.  PAST MEDICAL HISTORY:   Past Medical History:  Diagnosis Date  . Arthritis   . Atrial fibrillation, persistent ([place])    a. s/p TEE-guided DCCV on 08/20/2017  . Chest pain    a. 01/2012 Cath: nonobs dzs;  c. 04/2014 MV: No ischemia.  . Chronic back pain   . Chronic combined systolic (congestive) and diastolic (congestive) heart failure ([place])    a. Dx 2005 @ [facility];  b. 02/2014 Echo: Ef 55-60%, no rwma, mild MR, PASP 40mmHg. c. 07/2017: EF 30-35%, diffuse HK, trivial AI, severe MR, moderate TR  . Chronic respiratory failure (HCC)    a. on home O2  . COPD (chronic obstructive pulmonary disease) ([place])    a. Home O2 - 3lpm  . Gastritis   . History of intracranial hemorrhage    a. 6/14 described by neurosurgery as "small frontal posttraumatic SAH " - pt was on xarelto @ the time.  . Hyperlipidemia   . Hypertension   . Lipoma of back   . Lymphedema    a. Chronic LLE edema.  . Obesity   . Sepsis with  metabolic encephalopathy ([place])   . Sleep apnea   . Streptococcal bacteremia   . Stroke ([place])   . Symptomatic bradycardia    a. s/p BSX PPM.  . Thyroid nodule   . Type II diabetes mellitus ([place])   . Urinary incontinence     PAST SURGICAL HISTORY:  Past Surgical History:  Procedure Laterality Date  . ABDOMINAL HYSTERECTOMY  1969  . CARDIAC CATHETERIZATION  2013   @ [facility]: No obstructive CAD: Only 20% ostial left circumflex.  . CHOLECYSTECTOMY    . CYST REMOVAL TRUNK  2002   BACK  . INSERT / REPLACE / REMOVE PACEMAKER    . lipoma removal  2014   back  . PACEMAKER INSERTION  [address] Scientific dual chamber pacemaker implanted by Dr Caryl Comes for symptomatic bradycardia  . PERMANENT PACEMAKER INSERTION N/A 01/09/2014   Procedure: PERMANENT PACEMAKER INSERTION;  Surgeon: Deboraha Sprang, MD;  Location: [facility] CATH LAB;  Service: Cardiovascular;  Laterality: N/A;  . TEE WITHOUT CARDIOVERSION N/A 08/20/2017   Procedure: TRANSESOPHAGEAL ECHOCARDIOGRAM (TEE) & Direct current cardioversion;  Surgeon: Minna Merritts, MD;  Location: [facility] ORS;  Service: Cardiovascular;  Laterality: N/A;  . TRIGGER FINGER RELEASE    . VAGINAL HYSTERECTOMY      SOCIAL HISTORY:  Social History   Tobacco Use  . Smoking status: Former Smoker    Packs/day: 0.30  Years: 2.00    Pack years: 0.60    Types: Cigarettes    Last attempt to quit: 06/23/1990    Years since quitting: 27.2  . Smokeless tobacco: Never Used  Substance Use Topics  . Alcohol use: No    FAMILY HISTORY:  Family History  Problem Relation Age of Onset  . Heart disease Mother   . Hypertension Mother   . COPD Mother        was a smoker  . Thyroid disease Mother   . Hypertension Father   . Hypertension Sister   . Thyroid disease Sister   . Hypertension Sister   . Thyroid disease Sister   . Breast cancer Maternal Aunt   . Kidney disease Neg Hx   . Bladder Cancer Neg Hx     DRUG ALLERGIES:  Allergies  Allergen Reactions  .  Aspirin Hives and Other (See Comments)    Pt states that she is unable to take because she had bleeding in her brain.    . Other Other (See Comments)    Pt states that she is unable to take blood thinners because she had bleeding in her brain.  Blood Thinners-per doctor at [facility]  . Penicillins Hives, Shortness Of Breath, Swelling and Other (See Comments)    Has patient had a PCN reaction causing immediate rash, facial/tongue/throat swelling, SOB or lightheadedness with hypotension: Yes Has patient had a PCN reaction causing severe rash involving mucus membranes or skin necrosis: No Has patient had a PCN reaction that required hospitalization No Has patient had a PCN reaction occurring within the last 10 years: No If all of the above answers are "NO", then may proceed with Cephalosporin use.    REVIEW OF SYSTEMS:   CONSTITUTIONAL: No fever,+ fatigue, weakness.  EYES: No blurred or double vision.  EARS, NOSE, AND THROAT: No tinnitus or ear pain.  RESPIRATORY: + cough, shortness of breath, DOE  CARDIOVASCULAR: No chest pain, + orthopnea, edema.  GASTROINTESTINAL: No nausea, vomiting, diarrhea or abdominal pain.  GENITOURINARY: No dysuria, hematuria.  ENDOCRINE: No polyuria, nocturia,  HEMATOLOGY: No anemia, easy bruising or bleeding SKIN: No rash or lesion. MUSCULOSKELETAL: No joint pain or arthritis.   NEUROLOGIC: No tingling, numbness, weakness.  PSYCHIATRY: No anxiety or depression.   MEDICATIONS AT HOME:  Prior to Admission medications   Medication Sig Start Date End Date Taking? Authorizing Provider  acetaminophen (TYLENOL) 325 MG tablet Take 325-650 mg by mouth every 6 (six) hours as needed for mild pain. Reported on 10/10/2015    [provider]  albuterol (PROVENTIL) (2.5 MG/3ML) 0.083% nebulizer solution Take 3 mLs (2.5 mg total) by nebulization every 6 (six) hours. 08/28/17   Loletha Grayer, MD  apixaban (ELIQUIS) 5 MG TABS tablet Take 1 tablet (5 mg total) by  mouth 2 (two) times daily. 09/23/17   [name], MD  Dulaglutide ([name]) 7.51 WC/5.8NI SOPN Inject 0.75 mg into the skin once a week. 12/29/16   [name], MD  gabapentin (NEURONTIN) 100 MG capsule TAKE 2 CAPSULES BY MOUTH THREE TIMES DAILY 08/12/17   [name], MD  glucose blood (TRUE METRIX BLOOD GLUCOSE TEST) test strip Check blood glucose twice daily Dx:E11.8, Z79.4 09/17/17   [name], MD  losartan (COZAAR) 25 MG tablet Take 1 tablet (25 mg total) by mouth daily. 08/29/17   Loletha Grayer, MD  Menthol, Topical Analgesic, (BIOFREEZE EX) Apply 1 application topically as needed (for pain).     [provider]  metoprolol (TOPROL-XL) 200 MG 24 hr tablet Take 1 tablet (200 mg total) by mouth daily. Take with or immediately following a meal. 09/23/17   [name], MD  ondansetron (ZOFRAN) 8 MG tablet Take 1 tablet (8 mg total) by mouth every 8 (eight) hours as needed for nausea or vomiting. 09/25/14   Jackolyn Confer, MD  oxyCODONE-acetaminophen (PERCOCET/ROXICET) 5-325 MG tablet Take 1 tablet by mouth every 8 (eight) hours as needed for severe pain. 08/28/17   Loletha Grayer, MD  OXYGEN Inhale 4 L into the lungs daily.     [provider]  potassium chloride SA (K-DUR,KLOR-CON) 20 MEQ tablet Take 1 tablet (20 mEq total) by mouth daily. 08/28/17   Loletha Grayer, MD  promethazine (PHENERGAN) 25 MG tablet Take 25 mg by mouth every 4 (four) hours as needed for nausea or vomiting. Reported on 10/10/2015    [provider]  protein supplement shake (PREMIER PROTEIN) LIQD Take 325 mLs (11 oz total) by mouth 2 (two) times daily between meals. 08/28/17   Loletha Grayer, MD  rosuvastatin (CRESTOR) 10 MG tablet Take 1 tablet (10 mg total) by mouth daily. 12/29/16   [name], MD  sildenafil (REVATIO) 20 MG tablet Take 20 mg by mouth 3 (three) times daily. 05/20/17   [provider]  spironolactone (ALDACTONE) 25 MG tablet Take  0.5 tablets (12.5 mg total) by mouth daily. 08/28/17   Loletha Grayer, MD  torsemide (DEMADEX) 20 MG tablet Take 1 tablet (20 mg total) by mouth daily. 09/14/17 12/13/17  Strader, Fransisco Hertz, PA-C      PHYSICAL EXAMINATION:   VITAL SIGNS: Blood pressure (!) 140/105, pulse 93, temperature 98.8 F (37.1 C), temperature source Oral, resp. rate 20, height 5\' 1"  (1.549 m), weight 106.6 kg (235 lb), SpO2 95 %.  GENERAL:  72 y.o.-year-old patient lying in the bed with no acute distress.  Obese, nontoxic-appearing EYES: Pupils equal, round, reactive to light and accommodation. No scleral icterus. Extraocular muscles intact.  HEENT: Head atraumatic, normocephalic. Oropharynx and nasopharynx clear.  NECK:  Supple, no jugular venous distention. No thyroid enlargement, no tenderness.  LUNGS: Diminished breath sounds throughout. No use of accessory muscles of respiration.  CARDIOVASCULAR: S1, S2 normal. No murmurs, rubs, or gallops.  ABDOMEN: Soft, nontender, nondistended. Bowel sounds present. No organomegaly or mass.  EXTREMITIES: Bilateral lower extremity edema, no cyanosis, or clubbing.  NEUROLOGIC: Cranial nerves II through XII are intact. MAES. Gait not checked.  PSYCHIATRIC: The patient is alert and oriented x 3.  SKIN: No obvious rash, lesion, or ulcer.   LABORATORY PANEL:   CBC Recent Labs  Lab 09/30/17 1545  WBC 7.8  HGB 10.3*  HCT 32.7*  PLT 175  MCV 85.1  MCH 26.9  MCHC 31.6*  RDW 17.1*   ------------------------------------------------------------------------------------------------------------------  Chemistries  Recent Labs  Lab 09/30/17 1545  NA 140  K 3.3*  CL 104  CO2 28  GLUCOSE 146*  BUN 28*  CREATININE 1.29*  CALCIUM 9.2   ------------------------------------------------------------------------------------------------------------------ estimated creatinine clearance is 43.1 mL/min (A) (by C-G formula based on SCr of 1.29 mg/dL  (H)). ------------------------------------------------------------------------------------------------------------------ No results for input(s): TSH, T4TOTAL, T3FREE, THYROIDAB in the last 72 hours.  Invalid input(s): FREET3   Coagulation profile No results for input(s): INR, PROTIME in the last 168 hours. ------------------------------------------------------------------------------------------------------------------- No results for input(s): DDIMER in the last 72 hours. -------------------------------------------------------------------------------------------------------------------  Cardiac Enzymes Recent Labs  Lab 09/30/17 1545  TROPONINI 0.04*   ------------------------------------------------------------------------------------------------------------------ Invalid input(s):  POCBNP  ---------------------------------------------------------------------------------------------------------------  Urinalysis    Component Value Date/Time   COLORURINE AMBER (A) 08/24/2017 1634   APPEARANCEUR CLOUDY (A) 08/24/2017 1634   APPEARANCEUR Clear 11/18/2013 0443   LABSPEC 1.021 08/24/2017 1634   LABSPEC 1.049 11/18/2013 0443   PHURINE 5.0 08/24/2017 1634   GLUCOSEU NEGATIVE 08/24/2017 1634   GLUCOSEU Negative 11/18/2013 0443   HGBUR SMALL (A) 08/24/2017 1634   BILIRUBINUR NEGATIVE 08/24/2017 1634   BILIRUBINUR small 03/10/2016 1431   BILIRUBINUR Negative 11/18/2013 0443   KETONESUR NEGATIVE 08/24/2017 1634   PROTEINUR NEGATIVE 08/24/2017 1634   UROBILINOGEN 1.0 03/10/2016 1431   NITRITE POSITIVE (A) 08/24/2017 1634   LEUKOCYTESUR TRACE (A) 08/24/2017 1634   LEUKOCYTESUR Negative 11/18/2013 0443     RADIOLOGY: Dg Chest 2 View  Result Date: 09/30/2017 CLINICAL DATA:  Shortness of breath and weight gain of 17 pounds over the past several weeks. Suspect CHF exacerbation. History of atrial fibrillation, CHF, COPD, former smoker. EXAM: CHEST - 2 VIEW COMPARISON:  Portable  chest x-ray of August 25, 2017 FINDINGS: The right hemidiaphragm remains higher than the left. The right lung is hypoinflated. The left lung is better inflated. The interstitial markings are increased bilaterally with patchy areas of airspace opacity. The cardiac silhouette is enlarged. The pulmonary vascularity is engorged and indistinct. There are calcifications in the wall of the aortic arch. The ICD is in stable position. There is no significant pleural effusion. IMPRESSION: Increased interstitial markings bilaterally consistent with interstitial and early alveolar edema. Certainly pneumonia is not entirely excluded. No significant pleural effusion. Thoracic aortic atherosclerosis. Electronically Signed   By: David  Martinique M.D.   On: 09/30/2017 16:14    EKG: Orders placed or performed during the hospital encounter of 09/30/17  . ED EKG within 10 minutes  . ED EKG within 10 minutes    IMPRESSION AND PLAN: 1 acute on chronic systolic diastolic congestive heart failure exacerbation Most recent ejection fraction 30-35% Admit to regular nursing for bed on our congestive heart failure protocol, on Eliquis for A. fib, continue losartan, Toprol-XL, supplemental oxygen with weaning as tolerated-on 4 L chronically, continue Crestor, spironolactone, hold torsemide, IV Lasix 40 mg 3 times daily, Zaroxolyn 5 mg daily for 2 days, strict I&O monitoring, daily weights, and continue close medical monitoring  2 chronic A. Fib Stable Continue Eliquis, Toprol-XL  3 chronic hypoxic hypercarbic respiratory failure Stable Continue O2 via nasal cannula-on 4 L  which is her baseline  4 chronic obstructive sleep apnea Stable CPAP at bedtime/as needed  5 chronic diabetes mellitus type 2 Sliding scale insulin with Accu-Cheks per routine, low-sodium/cardiac/carbohydrate consistent diet with fluid restriction, check hemoglobin A1c to determine level control  6 chronic obesity Stable Most likely secondary to  excess calories Modification recommended  7 chronic pain syndrome Stable Continue home regiment     All the records are reviewed and case discussed with ED provider. Management plans discussed with the patient, family and they are in agreement.  CODE STATUS:full Code Status History    Date Active Date Inactive Code Status Order ID Comments User Context   08/16/2017 8182 08/28/2017 2239 Full Code [id number]  Gorden Harms, MD Inpatient   03/30/2015 2152 04/03/2015 2122 Full Code [id number]  Etta Quill, DO Inpatient   03/27/2015 2208 03/28/2015 1737 Full Code [id number]  Nicholes Mango, MD ED   01/09/2014 1418 01/10/2014 1803 Full Code [id number]  Deboraha Sprang, MD Inpatient   01/16/2013 2335 01/27/2013 2011 Full Code [id number]  Baltazar Apo  S, MD Inpatient       TOTAL TIME TAKING CARE OF THIS PATIENT: 45 minutes.    Avel Peace Salary M.D on 09/30/2017   Between 7am to 6pm - Pager - [phone]  After 6pm go to [email or web address] - password [email or web address] [facility]  Office  [phone]  CC: Primary care physician; [name], MD   Note: This dictation was prepared with Dragon dictation along with smaller phrase technology. Any transcriptional errors that result from this process are unintentional.

## 2017-10-01 ENCOUNTER — Ambulatory Visit: Payer: Medicare HMO | Admitting: Nurse Practitioner

## 2017-10-01 ENCOUNTER — Other Ambulatory Visit: Payer: Self-pay

## 2017-10-01 ENCOUNTER — Inpatient Hospital Stay (HOSPITAL_COMMUNITY)
Admit: 2017-10-01 | Discharge: 2017-10-01 | Disposition: A | Discharge status: Home / Self Care | Payer: Medicare HMO | Attending: Family Medicine | Admitting: Family Medicine

## 2017-10-01 DIAGNOSIS — I361 Nonrheumatic tricuspid (valve) insufficiency: Secondary | ICD-10-CM

## 2017-10-01 DIAGNOSIS — I340 Nonrheumatic mitral (valve) insufficiency: Secondary | ICD-10-CM

## 2017-10-01 LAB — BASIC METABOLIC PANEL
Anion gap: 6 (ref 5–15)
BUN: 26 mg/dL — ABNORMAL HIGH (ref 6–20)
CO2: 32 mmol/L (ref 22–32)
Calcium: 9.1 mg/dL (ref 8.9–10.3)
Chloride: 103 mmol/L (ref 101–111)
Creatinine, Ser: 1.26 mg/dL — ABNORMAL HIGH (ref 0.44–1.00)
GFR calc Af Amer: 47 mL/min — ABNORMAL LOW (ref >60)
GFR calc non Af Amer: 41 mL/min — ABNORMAL LOW (ref >60)
Glucose, Bld: 116 mg/dL — ABNORMAL HIGH (ref 65–99)
Potassium: 2.9 mmol/L — ABNORMAL LOW (ref 3.5–5.1)
Sodium: 141 mmol/L (ref 135–145)

## 2017-10-01 LAB — MRSA PCR SCREENING: MRSA by PCR: POSITIVE — AB

## 2017-10-01 LAB — GLUCOSE, CAPILLARY
Glucose-Capillary: 119 mg/dL — ABNORMAL HIGH (ref 65–99)
Glucose-Capillary: 161 mg/dL — ABNORMAL HIGH (ref 65–99)
Glucose-Capillary: 169 mg/dL — ABNORMAL HIGH (ref 65–99)
Glucose-Capillary: 150 mg/dL — ABNORMAL HIGH (ref 65–99)

## 2017-10-01 LAB — MAGNESIUM: Magnesium: 1.6 mg/dL — ABNORMAL LOW (ref 1.7–2.4)

## 2017-10-01 MED ORDER — MUPIROCIN 2 % EX OINT
TOPICAL_OINTMENT | Freq: Two times a day (BID) | CUTANEOUS | Status: DC
  Administered 2017-10-01 – 2017-10-04 (×8): via NASAL
  Administered 2017-10-05: 1 via NASAL
  Administered 2017-10-05 – 2017-10-06 (×2): via NASAL
  Administered 2017-10-06: 1 via NASAL
  Administered 2017-10-07 – 2017-10-10 (×7): via NASAL
  Administered 2017-10-10: 1 via NASAL
  Administered 2017-10-11 – 2017-10-22 (×19): via NASAL
  Filled 2017-10-01 (×2): qty 22

## 2017-10-01 NOTE — Progress Notes (Signed)
[place] at [facility]                                                                                                                                                                                  Patient Demographics   Debra Saunders, is a 72 y.o. female, DOB - [birth year], LKG:[id number]  Admit date - 09/30/2017   Admitting Physician Gorden Harms, MD  Outpatient Primary MD for the patient is [name], MD   LOS - 1  Subjective: Patient continues to be short of breath.  No chest pain or palpitations    Review of Systems:   CONSTITUTIONAL: No documented fever. No fatigue, weakness. No weight gain, no weight loss.  EYES: No blurry or double vision.  ENT: No tinnitus. No postnasal drip. No redness of the oropharynx.  RESPIRATORY: No cough, no wheeze, no hemoptysis.  Positive dyspnea.  CARDIOVASCULAR: No chest pain. No orthopnea. No palpitations. No syncope.  GASTROINTESTINAL: No nausea, no vomiting or diarrhea. No abdominal pain. No melena or hematochezia.  GENITOURINARY: No dysuria or hematuria.  ENDOCRINE: No polyuria or nocturia. No heat or cold intolerance.  HEMATOLOGY: No anemia. No bruising. No bleeding.  INTEGUMENTARY: No rashes. No lesions.  MUSCULOSKELETAL: No arthritis. No swelling. No gout.  NEUROLOGIC: No numbness, tingling, or ataxia. No seizure-type activity.  PSYCHIATRIC: No anxiety. No insomnia. No ADD.    Vitals:   Vitals:   10/01/17 0750 10/01/17 0810 10/01/17 1335 10/01/17 1556  BP:  (!) 145/85  (!) 141/92  Pulse:  84  91  Resp:  14  16  Temp:  98.5 F (36.9 C)  98.1 F (36.7 C)  TempSrc:  Oral  Oral  SpO2: 100% 100% 94% 100%  Weight:      Height:        Wt Readings from Last 3 Encounters:  10/01/17 103.7 kg (228 lb 9.6 oz)  09/30/17 106.8 kg (235 lb 8 oz)  09/21/17 105.3 kg (232 lb 4 oz)     Intake/Output Summary (Last 24 hours) at 10/01/2017 1719 Last data filed at 10/01/2017 1105 Gross per 24 hour   Intake 240 ml  Output 900 ml  Net -660 ml    Physical Exam:   GENERAL: Pleasant-appearing in no apparent distress.  HEAD, EYES, EARS, NOSE AND THROAT: Atraumatic, normocephalic. Extraocular muscles are intact. Pupils equal and reactive to light. Sclerae anicteric. No conjunctival injection. No oro-pharyngeal erythema.  NECK: Supple. There is no jugular venous distention. No bruits, no lymphadenopathy, no thyromegaly.  HEART: Regular rate and rhythm,. No murmurs, no rubs, no clicks.  LUNGS: Crackles at the bases. No rales or rhonchi. No wheezes.  ABDOMEN: Soft, flat, nontender, nondistended. Has good bowel sounds. No hepatosplenomegaly appreciated.  EXTREMITIES: No evidence of any cyanosis, clubbing, or peripheral edema.  +2 pedal and radial pulses bilaterally.  NEUROLOGIC: The patient is alert, awake, and oriented x3 with no focal motor or sensory deficits appreciated bilaterally.  SKIN: Moist and warm with no rashes appreciated.  Psych: Not anxious, depressed LN: No inguinal LN enlargement    Antibiotics   Anti-infectives (From admission, onward)   None      Medications   Scheduled Meds: . albuterol  2.5 mg Nebulization Q6H  . apixaban  5 mg Oral BID  . aspirin EC  81 mg Oral Daily  . furosemide  40 mg Intravenous TID  . gabapentin  200 mg Oral TID  . insulin aspart  0-20 Units Subcutaneous TID WC  . insulin aspart  0-5 Units Subcutaneous QHS  . losartan  25 mg Oral Daily  . metoprolol  200 mg Oral Daily  . mupirocin ointment   Nasal BID  . potassium chloride SA  40 mEq Oral Daily  . protein supplement shake  11 oz Oral BID BM  . rosuvastatin  10 mg Oral Daily  . sildenafil  20 mg Oral TID  . sodium chloride flush  3 mL Intravenous Q12H  . spironolactone  25 mg Oral Daily   Continuous Infusions: . sodium chloride     PRN Meds:.sodium chloride, acetaminophen, ALPRAZolam, ondansetron (ZOFRAN) IV, ondansetron, oxyCODONE-acetaminophen, promethazine, sodium chloride  flush   Data Review:   Micro Results Recent Results (from the past 240 hour(s))  MRSA PCR Screening     Status: Abnormal   Collection Time: 10/01/17  9:37 AM  Result Value Ref Range Status   MRSA by PCR POSITIVE (A) NEGATIVE Final    Comment:        [name] (FDA approved for NASAL specimens only), is one component of a comprehensive MRSA colonization surveillance program. It is not intended to diagnose MRSA infection nor to guide or monitor treatment for MRSA infections. RESULT CALLED TO, READ BACK BY AND VERIFIED WITH:  Gildardo Pounds AT 1207 10/01/17 SDR Performed at [facility], [address], [place], [name] [address]     Radiology Reports Dg Chest 2 View  Result Date: 09/30/2017 CLINICAL DATA:  Shortness of breath and weight gain of 17 pounds over the past several weeks. Suspect CHF exacerbation. History of atrial fibrillation, CHF, COPD, former smoker. EXAM: CHEST - 2 VIEW COMPARISON:  Portable chest x-ray of August 25, 2017 FINDINGS: The right hemidiaphragm remains higher than the left. The right lung is hypoinflated. The left lung is better inflated. The interstitial markings are increased bilaterally with patchy areas of airspace opacity. The cardiac silhouette is enlarged. The pulmonary vascularity is engorged and indistinct. There are calcifications in the wall of the aortic arch. The ICD is in stable position. There is no significant pleural effusion. IMPRESSION: Increased interstitial markings bilaterally consistent with interstitial and early alveolar edema. Certainly pneumonia is not entirely excluded. No significant pleural effusion. Thoracic aortic atherosclerosis. Electronically Signed   By: David  Martinique M.D.   On: 09/30/2017 16:14     CBC Recent Labs  Lab 09/30/17 1545  WBC 7.8  HGB 10.3*  HCT 32.7*  PLT 175  MCV 85.1  MCH 26.9  MCHC 31.6*  RDW 17.1*    Chemistries  Recent Labs  Lab 09/30/17 1545 09/30/17 1924  10/01/17 0448  NA 140  --  141  K  3.3*  --  2.9*  CL 104  --  103  CO2 28  --  32  GLUCOSE 146*  --  116*  BUN 28*  --  26*  CREATININE 1.29*  --  1.26*  CALCIUM 9.2  --  9.1  MG  --  1.3* 1.6*   ------------------------------------------------------------------------------------------------------------------ estimated creatinine clearance is 43.4 mL/min (A) (by C-G formula based on SCr of 1.26 mg/dL (H)). ------------------------------------------------------------------------------------------------------------------ Recent Labs    09/30/17 1924  HGBA1C 5.8*   ------------------------------------------------------------------------------------------------------------------ No results for input(s): CHOL, HDL, LDLCALC, TRIG, CHOLHDL, LDLDIRECT in the last 72 hours. ------------------------------------------------------------------------------------------------------------------ No results for input(s): TSH, T4TOTAL, T3FREE, THYROIDAB in the last 72 hours.  Invalid input(s): FREET3 ------------------------------------------------------------------------------------------------------------------ No results for input(s): VITAMINB12, FOLATE, FERRITIN, TIBC, IRON, RETICCTPCT in the last 72 hours.  Coagulation profile No results for input(s): INR, PROTIME in the last 168 hours.  No results for input(s): DDIMER in the last 72 hours.  Cardiac Enzymes Recent Labs  Lab 09/30/17 1545  TROPONINI 0.04*   ------------------------------------------------------------------------------------------------------------------ Invalid input(s): POCBNP    Assessment & Plan  Patient is a 72 year old with CHF COPD presenting with shortness of breath and weight gain  1 acute on chronic systolic diastolic congestive heart failure exacerbation Most recent ejection fraction 30-35% Continue IV Lasix and Zaroxolyn and metoprolol appreciate cardiology input  2 chronic A. Fib Stable Continue  Eliquis, Toprol-XL  3 chronic hypoxic hypercarbic respiratory failure Stable Continue O2 via nasal cannula-on 4 L  which is her baseline  4 chronic obstructive sleep apnea: No evidence of exasperation Stable CPAP at bedtime/as needed  5 chronic diabetes mellitus type 2 Sliding scale insulin with Accu-Cheks per routine, low-sodium/cardiac/carbohydrate consistent diet with fluid restriction, hemoglobin A1c is 5.8   6 chronic obesity Stable Weight loss recommended  7 chronic pain syndrome Stable Continue home regiment          Code Status Orders  (From admission, onward)        Start     Ordered   09/30/17 1901  Full code  Continuous     09/30/17 1901    Code Status History    Date Active Date Inactive Code Status Order ID Comments User Context   08/16/2017 1614 08/28/2017 2239 Full Code [id number]  Gorden Harms, MD Inpatient   03/30/2015 2152 04/03/2015 2122 Full Code [id number]  Etta Quill, DO Inpatient   03/27/2015 2208 03/28/2015 1737 Full Code [id number]  Nicholes Mango, MD ED   01/09/2014 1418 01/10/2014 1803 Full Code [id number]  Deboraha Sprang, MD Inpatient   01/16/2013 2335 01/27/2013 2011 Full Code [id number]  Collene Gobble, MD Inpatient           Consults cardiology   DVT Prophylaxis Eliquis  Lab Results  Component Value Date   PLT 175 09/30/2017     Time Spent in minutes 35 minutes greater than 50% of time spent in care coordination and counseling patient regarding the condition and plan of care.   Dustin Flock M.D on 10/01/2017 at 5:19 PM  Between 7am to 6pm - Pager - [phone]  After 6pm go to [email or web address] - Proofreader  [employer]   Office  [phone]

## 2017-10-01 NOTE — Progress Notes (Signed)
*  PRELIMINARY RESULTS* Echocardiogram 2D Echocardiogram has been performed.  Debra Saunders 10/01/2017, 3:49 PM

## 2017-10-01 NOTE — Plan of Care (Signed)
  Problem: Clinical Measurements: Goal: Ability to maintain clinical measurements within normal limits will improve Outcome: Not Progressing Note:  Last magnesium level remains low at only 1.6. Will continue to monitor lab values. Wenda Low [facility]

## 2017-10-02 DIAGNOSIS — I5033 Acute on chronic diastolic (congestive) heart failure: Secondary | ICD-10-CM

## 2017-10-02 DIAGNOSIS — I890 Lymphedema, not elsewhere classified: Secondary | ICD-10-CM

## 2017-10-02 DIAGNOSIS — J432 Centrilobular emphysema: Secondary | ICD-10-CM

## 2017-10-02 DIAGNOSIS — I340 Nonrheumatic mitral (valve) insufficiency: Secondary | ICD-10-CM

## 2017-10-02 LAB — BASIC METABOLIC PANEL
Anion gap: 8 (ref 5–15)
BUN: 31 mg/dL — ABNORMAL HIGH (ref 6–20)
CO2: 37 mmol/L — ABNORMAL HIGH (ref 22–32)
Calcium: 9.1 mg/dL (ref 8.9–10.3)
Chloride: 95 mmol/L — ABNORMAL LOW (ref 101–111)
Creatinine, Ser: 1.19 mg/dL — ABNORMAL HIGH (ref 0.44–1.00)
GFR calc Af Amer: 51 mL/min — ABNORMAL LOW (ref >60)
GFR calc non Af Amer: 44 mL/min — ABNORMAL LOW (ref >60)
Glucose, Bld: 135 mg/dL — ABNORMAL HIGH (ref 65–99)
Potassium: 2.8 mmol/L — ABNORMAL LOW (ref 3.5–5.1)
Sodium: 140 mmol/L (ref 135–145)

## 2017-10-02 LAB — GLUCOSE, CAPILLARY
Glucose-Capillary: 137 mg/dL — ABNORMAL HIGH (ref 65–99)
Glucose-Capillary: 170 mg/dL — ABNORMAL HIGH (ref 65–99)
Glucose-Capillary: 209 mg/dL — ABNORMAL HIGH (ref 65–99)
Glucose-Capillary: 144 mg/dL — ABNORMAL HIGH (ref 65–99)

## 2017-10-02 LAB — POTASSIUM: Potassium: 3.6 mmol/L (ref 3.5–5.1)

## 2017-10-02 MED ORDER — POTASSIUM CHLORIDE CRYS ER 20 MEQ PO TBCR
20.0000 meq | EXTENDED_RELEASE_TABLET | Freq: Once | ORAL | Status: AC
  Administered 2017-10-02: 20 meq via ORAL
  Filled 2017-10-02: qty 1

## 2017-10-02 MED ORDER — MAGNESIUM SULFATE 4 GM/100ML IV SOLN
4.0000 g | Freq: Once | INTRAVENOUS | Status: AC
  Administered 2017-10-02: 4 g via INTRAVENOUS
  Filled 2017-10-02: qty 100

## 2017-10-02 MED ORDER — AMIODARONE HCL 200 MG PO TABS
400.0000 mg | ORAL_TABLET | Freq: Two times a day (BID) | ORAL | Status: DC
Start: 2017-10-02 — End: 2017-10-09
  Administered 2017-10-02 – 2017-10-08 (×11): 400 mg via ORAL
  Filled 2017-10-02 (×13): qty 2

## 2017-10-02 MED ORDER — POTASSIUM CHLORIDE 10 MEQ/100ML IV SOLN
10.0000 meq | INTRAVENOUS | Status: AC
  Administered 2017-10-02 (×5): 10 meq via INTRAVENOUS
  Filled 2017-10-02 (×5): qty 100

## 2017-10-02 NOTE — Consult Note (Signed)
Cardiology Consult    Patient ID: TIAHNA CURE MRN: [id number], DOB/AGE: 74-Nov-1947   Admit date: 09/30/2017 Date of Consult: 10/02/2017  Primary Physician: [name], MD Primary Cardiologist: [name], MD Requesting Provider: Chauncey Cruel. Posey Pronto, MD  Patient Profile    Debra Saunders is a 72 y.o. female with a history of nonobstructive CAD, chronic combined systolic and diastolic congestive heart failure, persistent atrial for ablation, symptomatic bradycardia status post permanent pacemaker, frequent PVCs, chronic respiratory failure with hypoxia on home O2, COPD, hypertension, hyperlipidemia, obesity, type 2 diabetes mellitus, traumatic subarachnoid hemorrhage in the setting of Xarelto, left lower extremity DVT status post IVC filter, chronic right sided atypical chest pain, chronic left lower extremity lymphedema, prior stroke, sleep apnea, and obesity, who is being seen today for the evaluation of acute on chronic combined heart failure in the setting of persistent atrial fibrillation at the request of Dr. Posey Pronto.  Past Medical History   Past Medical History:  Diagnosis Date  . Arthritis   . Atrial fibrillation, persistent ([place])    a. s/p TEE-guided DCCV on 08/20/2017  . Chest pain    a. 01/2012 Cath: nonobs dzs;  c. 04/2014 MV: No ischemia.  . Chronic back pain   . Chronic combined systolic (congestive) and diastolic (congestive) heart failure ([place])    a. Dx 2005 @ [facility];  b. 02/2014 Echo: Ef 55-60%, no rwma, mild MR, PASP 40mmHg. c. 07/2017: EF 30-35%, diffuse HK, trivial AI, severe MR, moderate TR  . Chronic respiratory failure (HCC)    a. on home O2  . COPD (chronic obstructive pulmonary disease) ([place])    a. Home O2 - 3lpm  . Gastritis   . History of intracranial hemorrhage    a. 6/14 described by neurosurgery as "small frontal posttraumatic SAH " - pt was on xarelto @ the time.  . Hyperlipidemia   . Hypertension   . Lipoma of back   . Lymphedema    a. Chronic LLE  edema.  . Obesity   . Sepsis with metabolic encephalopathy ([place])   . Sleep apnea   . Streptococcal bacteremia   . Stroke ([place])   . Symptomatic bradycardia    a. s/p BSX PPM.  . Thyroid nodule   . Type II diabetes mellitus ([place])   . Urinary incontinence     Past Surgical History:  Procedure Laterality Date  . ABDOMINAL HYSTERECTOMY  1969  . CARDIAC CATHETERIZATION  2013   @ [place]: No obstructive CAD: Only 20% ostial left circumflex.  . CHOLECYSTECTOMY    . CYST REMOVAL TRUNK  2002   BACK  . INSERT / REPLACE / REMOVE PACEMAKER    . lipoma removal  2014   back  . PACEMAKER INSERTION  [address] Scientific dual chamber pacemaker implanted by Dr Caryl Comes for symptomatic bradycardia  . PERMANENT PACEMAKER INSERTION N/A 01/09/2014   Procedure: PERMANENT PACEMAKER INSERTION;  Surgeon: Deboraha Sprang, MD;  Location: [facility] CATH LAB;  Service: Cardiovascular;  Laterality: N/A;  . TEE WITHOUT CARDIOVERSION N/A 08/20/2017   Procedure: TRANSESOPHAGEAL ECHOCARDIOGRAM (TEE) & Direct current cardioversion;  Surgeon: Minna Merritts, MD;  Location: [facility] ORS;  Service: Cardiovascular;  Laterality: N/A;  . TRIGGER FINGER RELEASE    . VAGINAL HYSTERECTOMY       Allergies  Allergies  Allergen Reactions  . Aspirin Hives and Other (See Comments)    Pt states that she is unable to take because she had bleeding in her brain.    [name]  Other Other (See Comments)    Pt states that she is unable to take blood thinners because she had bleeding in her brain.  Blood Thinners-per doctor at [facility]  . Penicillins Hives, Shortness Of Breath, Swelling and Other (See Comments)    Has patient had a PCN reaction causing immediate rash, facial/tongue/throat swelling, SOB or lightheadedness with hypotension: Yes Has patient had a PCN reaction causing severe rash involving mucus membranes or skin necrosis: No Has patient had a PCN reaction that required hospitalization No Has patient had a PCN reaction occurring  within the last 10 years: No If all of the above answers are "NO", then may proceed with Cephalosporin use.    History of Present Illness    72 year old female with the above complex past medical history including nonobstructive CAD, persistent atrial fibrillation, chronic combined heart failure, symptomatic bradycardia status post permanent pacemaker, frequent PVCs, chronic respiratory failure and hypoxia in the setting of COPD, requiring home O2, hypertension, hyperlipidemia, diabetes, small traumatic subarachnoid hemorrhage while on Xarelto, DVT status post IVC filter, chronic left lower extremity lymphedema, atypical chest pain, stroke, sleep apnea, and obesity.  She was hospitalized in February 2019 with heart failure and A. fib with RVR.  During that admission she was diuresed and after review of prior neurosurgical records, she was placed on Eliquis.  She later underwent TEE and cardioversion and was noted to have intermittent paroxysmal atrial fibrillation post cardioversion but was subsequently discharged to skilled nursing in sinus rhythm.  She says that following hospital discharge, she began to feel poorly with progressive dyspnea and volume excess.  She was seen in our office on March 25 at which time, she was volume overloaded and her Lasix was switched to torsemide.  She was also noted to be back in atrial fibrillation at that time, which she said had been intermittent since hospital discharge.  She was seen at heart failure clinic April 1 and weight was up another pound.  Torsemide was increased.  I saw her in clinic on April 10, and unfortunately, she reported progressive increase in volume with bilateral lower extremity swelling, increasing abdominal girth, and 3 pillow orthopnea.  She was dyspneic just talking.  Weight was up 17 pounds since her hospital discharge in early March.  As result, she was sent to the emergency department for admission and diuresis.  She has responded well to Lasix  and is -3-1/2 L for admission.  Her weight is down 9 pounds, but she is still 8 pounds above her dry weight.  She still reports some dyspnea.  She remains in atrial fibrillation.  Inpatient Medications    . albuterol  2.5 mg Nebulization Q6H  . apixaban  5 mg Oral BID  . aspirin EC  81 mg Oral Daily  . furosemide  40 mg Intravenous TID  . gabapentin  200 mg Oral TID  . insulin aspart  0-20 Units Subcutaneous TID WC  . insulin aspart  0-5 Units Subcutaneous QHS  . losartan  25 mg Oral Daily  . metoprolol  200 mg Oral Daily  . mupirocin ointment   Nasal BID  . potassium chloride SA  40 mEq Oral Daily  . protein supplement shake  11 oz Oral BID BM  . rosuvastatin  10 mg Oral Daily  . sildenafil  20 mg Oral TID  . sodium chloride flush  3 mL Intravenous Q12H  . spironolactone  25 mg Oral Daily    Family History    Family  History  Problem Relation Age of Onset  . Heart disease Mother   . Hypertension Mother   . COPD Mother        was a smoker  . Thyroid disease Mother   . Hypertension Father   . Hypertension Sister   . Thyroid disease Sister   . Hypertension Sister   . Thyroid disease Sister   . Breast cancer Maternal Aunt   . Kidney disease Neg Hx   . Bladder Cancer Neg Hx    indicated that her mother is deceased. She indicated that her father is deceased. She indicated that both of her sisters are alive. She indicated that the status of her maternal aunt is unknown. She indicated that the status of her neg hx is unknown.   Social History    Social History   Socioeconomic History  . Marital status: Divorced    Spouse name: Not on file  . Number of children: Not on file  . Years of education: Not on file  . Highest education level: Not on file  Occupational History  . Occupation: Retired- factory work    Comment: exposed to [place]  . Financial resource strain: Not on file  . Food insecurity:    Worry: Not on file    Inability: Not on file  .  Transportation needs:    Medical: Not on file    Non-medical: Not on file  Tobacco Use  . Smoking status: Former Smoker    Packs/day: 0.30    Years: 2.00    Pack years: 0.60    Types: Cigarettes    Last attempt to quit: 06/23/1990    Years since quitting: 27.2  . Smokeless tobacco: Never Used  Substance and Sexual Activity  . Alcohol use: No  . Drug use: No  . Sexual activity: Not on file  Lifestyle  . Physical activity:    Days per week: Not on file    Minutes per session: Not on file  . Stress: Not on file  Relationships  . Social connections:    Talks on phone: Not on file    Gets together: Not on file    Attends religious service: Not on file    Active member of club or organization: Not on file    Attends meetings of clubs or organizations: Not on file    Relationship status: Not on file  . Intimate partner violence:    Fear of current or ex partner: Not on file    Emotionally abused: Not on file    Physically abused: Not on file    Forced sexual activity: Not on file  Other Topics Concern  . Not on file  Social History Narrative   Lives in [place] with daughter. Has 4 children. No pets.      Work - Restaurant manager, fast food, retired      Diet - regular     Review of Systems    General:  No chills, fever, night sweats or weight changes.  Cardiovascular:  +++  Chronic L>R lower extremity edema +++ dyspnea on exertion, +++ orthopnea, +++ palpitations, +++ paroxysmal nocturnal dyspnea, +++ inc abd girth. Dermatological: No rash, lesions/masses Respiratory: No cough, +++ dyspnea Urologic: No hematuria, dysuria Abdominal:   +++ inc abd girth.  No nausea, vomiting, diarrhea, bright red blood per rectum, melena, or hematemesis Neurologic:  No visual changes, wkns, changes in mental status. All other systems reviewed and are otherwise negative except as noted above.  Physical Exam    Blood pressure (!) 123/94, pulse 81, temperature (!) 97.5 F (36.4 C), temperature source  Oral, resp. rate 17, height 5\' 1"  (1.549 m), weight 226 lb 8 oz (102.7 kg), SpO2 94 %.  General: Pleasant, NAD Psych: Normal affect. Neuro: Alert and oriented X 3. Moves all extremities spontaneously. HEENT: Normal  Neck: Supple without bruits. JVP not as pronounced as when seen in clinc on 4/10. Difficult to gauge in setting of obesity. Lungs:  Resp regular and unlabored, CTA. Heart: IR, IR, distant, no s3, s4, or murmurs. Abdomen: obese, soft, diffusely tender, non-distended, BS + x 4.  Extremities: No clubbing, cyanosis.  LE edema much improved - trace to 1+ on R, 2+ on left. DP/PT/Radials 1+ and equal bilaterally.  Labs    Recent Labs    09/30/17 1545  TROPONINI 0.04*   Lab Results  Component Value Date   WBC 7.8 09/30/2017   HGB 10.3 (L) 09/30/2017   HCT 32.7 (L) 09/30/2017   MCV 85.1 09/30/2017   PLT 175 09/30/2017    Recent Labs  Lab 10/02/17 0429  NA 140  K 2.8*  CL 95*  CO2 37*  BUN 31*  CREATININE 1.19*  CALCIUM 9.1  GLUCOSE 135*   Lab Results  Component Value Date   CHOL 102 08/17/2017   HDL 52 08/17/2017   LDLCALC 37 08/17/2017   TRIG 67 08/17/2017    Radiology Studies    Dg Chest 2 View  Result Date: 09/30/2017 CLINICAL DATA:  Shortness of breath and weight gain of 17 pounds over the past several weeks. Suspect CHF exacerbation. History of atrial fibrillation, CHF, COPD, former smoker. EXAM: CHEST - 2 VIEW COMPARISON:  Portable chest x-ray of August 25, 2017 FINDINGS: The right hemidiaphragm remains higher than the left. The right lung is hypoinflated. The left lung is better inflated. The interstitial markings are increased bilaterally with patchy areas of airspace opacity. The cardiac silhouette is enlarged. The pulmonary vascularity is engorged and indistinct. There are calcifications in the wall of the aortic arch. The ICD is in stable position. There is no significant pleural effusion. IMPRESSION: Increased interstitial markings bilaterally consistent  with interstitial and early alveolar edema. Certainly pneumonia is not entirely excluded. No significant pleural effusion. Thoracic aortic atherosclerosis. Electronically Signed   By: David  Martinique M.D.   On: 09/30/2017 16:14    ECG & Cardiac Imaging    From April 10: A. fib, 93, leftward axis, nonspecific T changes.  She has remained in atrial fibrillation on telemetry with rates in the low 80s to low 100s.  Assessment & Plan    1.  Acute on chronic combined systolic and diastolic congestive heart failure: In the setting of persistent atrial fibrillation.  Last echo, performed in the setting of A. fib showed an EF of 30-35% in February 2019.  She was cardioverted in February 2019 without office follow-up in March, she was back in A. fib and was already volume overloaded.  She failed outpatient adjustment of diuretics and was sent over from clinic on April 10 for admission diuresis.  She has diuresed well and is already down 9 pounds but still 8 pounds above her dry weight.  Recommend continuation of aggressive IV diuretics.  Renal function stable.  Correct potassium and magnesium.  Continue beta-blocker, ARB, and Spironolactone.  Will attempt to restore sinus rhythm and plan eventual follow-up echo.  2.  Persistent atrial fibrillation: I suspect this is driving her heart failure to some  extent despite reasonable rate control.  As previously discussed, we will initiate antiarrhythmic therapy with amiodarone.  With ongoing hypokalemia and hypomagnesemia, she is not a suitable dofetilide candidate.  Continue Eliquis.  If she does not convert with amiodarone loading, would plan on repeat cardioversion.  3.  Essential hypertension: Stable.  4.  Hyperlipidemia: Continue statin therapy.  5.  COPD: No active wheezing.  6.  Prior history of traumatic subarachnoid hemorrhage:   Notes previous reviewed.  This occurred in 2014 while on Xarelto.  She has been tolerating Eliquis, which was initiated in  February.  7.  Severe MR: Likely playing a role in #1.  Question functional in the setting of A. fib and CHF.  Poor surgical candidate.  Question potential role for mitral clip in the future.  8.  Hypokalemia and hypomagnesemia: Aggressive supplementation per internal medicine.   Signed, Murray Hodgkins, NP 10/02/2017, 3:01 PM  For questions or updates, please contact   Please consult [email or web address] for contact info under Cardiology/STEMI.

## 2017-10-02 NOTE — Consult Note (Signed)
MEDICATION RELATED CONSULT NOTE - INITIAL   Pharmacy Consult for electrolytes Indication: hypokalemia, hypomag  Allergies  Allergen Reactions  . Aspirin Hives and Other (See Comments)    Pt states that she is unable to take because she had bleeding in her brain.    . Other Other (See Comments)    Pt states that she is unable to take blood thinners because she had bleeding in her brain.  Blood Thinners-per doctor at [facility]  . Penicillins Hives, Shortness Of Breath, Swelling and Other (See Comments)    Has patient had a PCN reaction causing immediate rash, facial/tongue/throat swelling, SOB or lightheadedness with hypotension: Yes Has patient had a PCN reaction causing severe rash involving mucus membranes or skin necrosis: No Has patient had a PCN reaction that required hospitalization No Has patient had a PCN reaction occurring within the last 10 years: No If all of the above answers are "NO", then may proceed with Cephalosporin use.    Patient Measurements: Height: 5\' 1"  (154.9 cm) Weight: 226 lb 8 oz (102.7 kg) IBW/kg (Calculated) : 47.8 Adjusted Body Weight:   Vital Signs: Temp: 99.1 F (37.3 C) (04/12 1608) Temp Source: Oral (04/12 1608) BP: 102/71 (04/12 1608) Pulse Rate: 74 (04/12 1608) Intake/Output from previous day: 04/11 0701 - 04/12 0700 In: 240 [P.O.:240] Out: 3700 [Urine:3700] Intake/Output from this shift: Total I/O In: -  Out: 400 [Urine:400]  Labs: Recent Labs    09/30/17 1545 09/30/17 1924 10/01/17 0448 10/02/17 0429  WBC 7.8  --   --   --   HGB 10.3*  --   --   --   HCT 32.7*  --   --   --   PLT 175  --   --   --   CREATININE 1.29*  --  1.26* 1.19*  MG  --  1.3* 1.6*  --    Estimated Creatinine Clearance: 45.7 mL/min (A) (by C-G formula based on SCr of 1.19 mg/dL (H)).   Microbiology: Recent Results (from the past 720 hour(s))  MRSA PCR Screening     Status: Abnormal   Collection Time: 10/01/17  9:37 AM  Result Value Ref Range Status    MRSA by PCR POSITIVE (A) NEGATIVE Final    Comment:        [name] (FDA approved for NASAL specimens only), is one component of a comprehensive MRSA colonization surveillance program. It is not intended to diagnose MRSA infection nor to guide or monitor treatment for MRSA infections. RESULT CALLED TO, READ BACK BY AND VERIFIED WITH:  Gildardo Pounds AT 1207 10/01/17 SDR Performed at [facility], [address], [place], [place] [address]     Medical History: Past Medical History:  Diagnosis Date  . Arthritis   . Atrial fibrillation, persistent ([place])    a. s/p TEE-guided DCCV on 08/20/2017  . Chest pain    a. 01/2012 Cath: nonobs dzs;  c. 04/2014 MV: No ischemia.  . Chronic back pain   . Chronic combined systolic (congestive) and diastolic (congestive) heart failure ([place])    a. Dx 2005 @ [facility];  b. 02/2014 Echo: Ef 55-60%, no rwma, mild MR, PASP 44mmHg. c. 07/2017: EF 30-35%, diffuse HK, trivial AI, severe MR, moderate TR  . Chronic respiratory failure (HCC)    a. on home O2  . COPD (chronic obstructive pulmonary disease) ([place])    a. Home O2 - 3lpm  . Gastritis   . History of intracranial hemorrhage  a. 6/14 described by neurosurgery as "small frontal posttraumatic SAH " - pt was on xarelto @ the time.  . Hyperlipidemia   . Hypertension   . Lipoma of back   . Lymphedema    a. Chronic LLE edema.  . Obesity   . Sepsis with metabolic encephalopathy ([place])   . Sleep apnea   . Streptococcal bacteremia   . Stroke ([place])   . Symptomatic bradycardia    a. s/p BSX PPM.  . Thyroid nodule   . Type II diabetes mellitus ([place])   . Urinary incontinence     Medications:  Scheduled:  . albuterol  2.5 mg Nebulization Q6H  . amiodarone  400 mg Oral BID  . apixaban  5 mg Oral BID  . aspirin EC  81 mg Oral Daily  . furosemide  40 mg Intravenous TID  . gabapentin  200 mg Oral TID  . insulin aspart  0-20 Units Subcutaneous TID WC  . insulin aspart  0-5  Units Subcutaneous QHS  . losartan  25 mg Oral Daily  . metoprolol  200 mg Oral Daily  . mupirocin ointment   Nasal BID  . potassium chloride SA  40 mEq Oral Daily  . protein supplement shake  11 oz Oral BID BM  . rosuvastatin  10 mg Oral Daily  . sildenafil  20 mg Oral TID  . sodium chloride flush  3 mL Intravenous Q12H  . spironolactone  25 mg Oral Daily    Assessment: Pt is a 72 year old female admitted with CHF exacerbation. Pt is being aggressively diuresised. K this AM=2.8, Mg yesterday was 1.6. Pt is also in Afib  Goal of Therapy:  K=4-5 Mg 2-2.4  Plan:  K=3.6; KCL po 20 MEQ once @ 2000  Patient is on scheulded KCl po 40MEq once daily, but will give additional dose of 2mEq. Recheck K and Mg in the AM  Candelaria Stagers, PharmD Pharmacy Resident  10/02/2017,5:38 PM

## 2017-10-02 NOTE — Progress Notes (Addendum)
[place] at [facility]                                                                                                                                                                                  Patient Demographics   Debra Saunders, is a 72 y.o. female, DOB - December 27, 1945, PRF:[id number]  Admit date - 09/30/2017   Admitting Physician Gorden Harms, MD  Outpatient Primary MD for the patient is [name], MD   LOS - 2  Subjective: She is feeling better seen by cardiology they are recommending continuing diuresis   Review of Systems:   CONSTITUTIONAL: No documented fever. No fatigue, weakness. No weight gain, no weight loss.  EYES: No blurry or double vision.  ENT: No tinnitus. No postnasal drip. No redness of the oropharynx.  RESPIRATORY: No cough, no wheeze, no hemoptysis.  Positive dyspnea.  CARDIOVASCULAR: No chest pain. No orthopnea. No palpitations. No syncope.  GASTROINTESTINAL: No nausea, no vomiting or diarrhea. No abdominal pain. No melena or hematochezia.  GENITOURINARY: No dysuria or hematuria.  ENDOCRINE: No polyuria or nocturia. No heat or cold intolerance.  HEMATOLOGY: No anemia. No bruising. No bleeding.  INTEGUMENTARY: No rashes. No lesions.  MUSCULOSKELETAL: No arthritis. No swelling. No gout.  NEUROLOGIC: No numbness, tingling, or ataxia. No seizure-type activity.  PSYCHIATRIC: No anxiety. No insomnia. No ADD.    Vitals:   Vitals:   10/02/17 0202 10/02/17 0410 10/02/17 0741 10/02/17 1408  BP:  125/81 (!) 123/94   Pulse:  60 81   Resp:  17    Temp:  98.6 F (37 C) (!) 97.5 F (36.4 C)   TempSrc:   Oral   SpO2: 98% 95% 96% 94%  Weight:  102.7 kg (226 lb 8 oz)    Height:        Wt Readings from Last 3 Encounters:  10/02/17 102.7 kg (226 lb 8 oz)  09/30/17 106.8 kg (235 lb 8 oz)  09/21/17 105.3 kg (232 lb 4 oz)     Intake/Output Summary (Last 24 hours) at 10/02/2017 1515 Last data filed at 10/02/2017  0442 Gross per 24 hour  Intake -  Output 2800 ml  Net -2800 ml    Physical Exam:   GENERAL: Pleasant-appearing in no apparent distress.  HEAD, EYES, EARS, NOSE AND THROAT: Atraumatic, normocephalic. Extraocular muscles are intact. Pupils equal and reactive to light. Sclerae anicteric. No conjunctival injection. No oro-pharyngeal erythema.  NECK: Supple. There is no jugular venous distention. No bruits, no lymphadenopathy, no thyromegaly.  HEART: Regular rate and rhythm,. No murmurs, no rubs, no clicks.  LUNGS: Crackles at the bases. No rales or rhonchi. No  wheezes.  ABDOMEN: Soft, flat, nontender, nondistended. Has good bowel sounds. No hepatosplenomegaly appreciated.  EXTREMITIES: No evidence of any cyanosis, clubbing, or peripheral edema.  +2 pedal and radial pulses bilaterally.  NEUROLOGIC: The patient is alert, awake, and oriented x3 with no focal motor or sensory deficits appreciated bilaterally.  SKIN: Moist and warm with no rashes appreciated.  Psych: Not anxious, depressed LN: No inguinal LN enlargement    Antibiotics   Anti-infectives (From admission, onward)   None      Medications   Scheduled Meds: . albuterol  2.5 mg Nebulization Q6H  . apixaban  5 mg Oral BID  . aspirin EC  81 mg Oral Daily  . furosemide  40 mg Intravenous TID  . gabapentin  200 mg Oral TID  . insulin aspart  0-20 Units Subcutaneous TID WC  . insulin aspart  0-5 Units Subcutaneous QHS  . losartan  25 mg Oral Daily  . metoprolol  200 mg Oral Daily  . mupirocin ointment   Nasal BID  . potassium chloride SA  40 mEq Oral Daily  . protein supplement shake  11 oz Oral BID BM  . rosuvastatin  10 mg Oral Daily  . sildenafil  20 mg Oral TID  . sodium chloride flush  3 mL Intravenous Q12H  . spironolactone  25 mg Oral Daily   Continuous Infusions: . sodium chloride     PRN Meds:.sodium chloride, acetaminophen, ALPRAZolam, ondansetron (ZOFRAN) IV, ondansetron, oxyCODONE-acetaminophen,  promethazine, sodium chloride flush   Data Review:   Micro Results Recent Results (from the past 240 hour(s))  MRSA PCR Screening     Status: Abnormal   Collection Time: 10/01/17  9:37 AM  Result Value Ref Range Status   MRSA by PCR POSITIVE (A) NEGATIVE Final    Comment:        [name] (FDA approved for NASAL specimens only), is one component of a comprehensive MRSA colonization surveillance program. It is not intended to diagnose MRSA infection nor to guide or monitor treatment for MRSA infections. RESULT CALLED TO, READ BACK BY AND VERIFIED WITH:  Gildardo Pounds AT 1207 10/01/17 SDR Performed at [facility], [address] [place], [place] [address]     Radiology Reports Dg Chest 2 View  Result Date: 09/30/2017 CLINICAL DATA:  Shortness of breath and weight gain of 17 pounds over the past several weeks. Suspect CHF exacerbation. History of atrial fibrillation, CHF, COPD, former smoker. EXAM: CHEST - 2 VIEW COMPARISON:  Portable chest x-ray of August 25, 2017 FINDINGS: The right hemidiaphragm remains higher than the left. The right lung is hypoinflated. The left lung is better inflated. The interstitial markings are increased bilaterally with patchy areas of airspace opacity. The cardiac silhouette is enlarged. The pulmonary vascularity is engorged and indistinct. There are calcifications in the wall of the aortic arch. The ICD is in stable position. There is no significant pleural effusion. IMPRESSION: Increased interstitial markings bilaterally consistent with interstitial and early alveolar edema. Certainly pneumonia is not entirely excluded. No significant pleural effusion. Thoracic aortic atherosclerosis. Electronically Signed   By: David  Martinique M.D.   On: 09/30/2017 16:14     CBC Recent Labs  Lab 09/30/17 1545  WBC 7.8  HGB 10.3*  HCT 32.7*  PLT 175  MCV 85.1  MCH 26.9  MCHC 31.6*  RDW 17.1*    Chemistries  Recent Labs  Lab  09/30/17 1545 09/30/17 1924 10/01/17 0448 10/02/17 0429  NA 140  --  141 140  K 3.3*  --  2.9* 2.8*  CL 104  --  103 95*  CO2 28  --  32 37*  GLUCOSE 146*  --  116* 135*  BUN 28*  --  26* 31*  CREATININE 1.29*  --  1.26* 1.19*  CALCIUM 9.2  --  9.1 9.1  MG  --  1.3* 1.6*  --    ------------------------------------------------------------------------------------------------------------------ estimated creatinine clearance is 45.7 mL/min (A) (by C-G formula based on SCr of 1.19 mg/dL (H)). ------------------------------------------------------------------------------------------------------------------ Recent Labs    09/30/17 1924  HGBA1C 5.8*   ------------------------------------------------------------------------------------------------------------------ No results for input(s): CHOL, HDL, LDLCALC, TRIG, CHOLHDL, LDLDIRECT in the last 72 hours. ------------------------------------------------------------------------------------------------------------------ No results for input(s): TSH, T4TOTAL, T3FREE, THYROIDAB in the last 72 hours.  Invalid input(s): FREET3 ------------------------------------------------------------------------------------------------------------------ No results for input(s): VITAMINB12, FOLATE, FERRITIN, TIBC, IRON, RETICCTPCT in the last 72 hours.  Coagulation profile No results for input(s): INR, PROTIME in the last 168 hours.  No results for input(s): DDIMER in the last 72 hours.  Cardiac Enzymes Recent Labs  Lab 09/30/17 1545  TROPONINI 0.04*   ------------------------------------------------------------------------------------------------------------------ Invalid input(s): POCBNP    Assessment & Plan  Patient is a 72 year old with CHF COPD presenting with shortness of breath and weight gain  1 acute on chronic  diastolic congestive heart failure exacerbation Current echo shows normal EF Continue IV Lasix and spironolactone and  metoprolol appreciate cardiology input  2 chronic A. Fib Stable Continue Eliquis, Toprol-XL  3 chronic hypoxic hypercarbic respiratory failure Stable Continue O2 via nasal cannula-on 4 L  which is her baseline  4  hypokalemia replace potassium pharmacy has been consulted Replace magnesium as well   5 chronic diabetes mellitus type 2 Sliding scale insulin with Accu-Cheks per routine, low-sodium/cardiac/carbohydrate consistent diet with fluid restriction, hemoglobin A1c is 5.8   6  acute renal failure due to fluid overload renal function improved with diuresis continue to monitor  7 chronic pain syndrome Stable Continue home regiment  8. chronic obstructive sleep apnea: No evidence of exasperation Stable CPAP at bedtime/as needed         Code Status Orders  (From admission, onward)        Start     Ordered   09/30/17 1901  Full code  Continuous     09/30/17 1901    Code Status History    Date Active Date Inactive Code Status Order ID Comments User Context   08/16/2017 1614 08/28/2017 2239 Full Code [id number]  Gorden Harms, MD Inpatient   03/30/2015 2152 04/03/2015 2122 Full Code [id number]  Etta Quill, DO Inpatient   03/27/2015 2208 03/28/2015 1737 Full Code [id number]  Nicholes Mango, MD ED   01/09/2014 1418 01/10/2014 1803 Full Code [id number]  Deboraha Sprang, MD Inpatient   01/16/2013 2335 01/27/2013 2011 Full Code [id number]  Collene Gobble, MD Inpatient           Consults cardiology   DVT Prophylaxis Eliquis  Lab Results  Component Value Date   PLT 175 09/30/2017     Time Spent in minutes 35 minutes greater than 50% of time spent in care coordination and counseling patient regarding the condition and plan of care.   Dustin Flock M.D on 10/02/2017 at 3:15 PM  Between 7am to 6pm - Pager - [phone]  After 6pm go to [email or web address] - Proofreader  [employer]   Office  [phone]

## 2017-10-02 NOTE — Care Management (Signed)
Patient admitted from home with CHF.  Patient recently discharged from [facility].  Patient open with [facility] for RN, PT, OT.  Patient has chronic O2 through [employer], 4 liters.  PCP [place].  Patient is followed at the heart failure clinic.  PT consult requested. RNCM following

## 2017-10-02 NOTE — Consult Note (Signed)
MEDICATION RELATED CONSULT NOTE - INITIAL   Pharmacy Consult for electrolytes Indication: hypokalemia, hypomag  Allergies  Allergen Reactions  . Aspirin Hives and Other (See Comments)    Pt states that she is unable to take because she had bleeding in her brain.    . Other Other (See Comments)    Pt states that she is unable to take blood thinners because she had bleeding in her brain.  Blood Thinners-per doctor at [facility]  . Penicillins Hives, Shortness Of Breath, Swelling and Other (See Comments)    Has patient had a PCN reaction causing immediate rash, facial/tongue/throat swelling, SOB or lightheadedness with hypotension: Yes Has patient had a PCN reaction causing severe rash involving mucus membranes or skin necrosis: No Has patient had a PCN reaction that required hospitalization No Has patient had a PCN reaction occurring within the last 10 years: No If all of the above answers are "NO", then may proceed with Cephalosporin use.    Patient Measurements: Height: 5\' 1"  (154.9 cm) Weight: 226 lb 8 oz (102.7 kg) IBW/kg (Calculated) : 47.8 Adjusted Body Weight:   Vital Signs: Temp: 97.5 F (36.4 C) (04/12 0741) Temp Source: Oral (04/12 0741) BP: 123/94 (04/12 0741) Pulse Rate: 81 (04/12 0741) Intake/Output from previous day: 04/11 0701 - 04/12 0700 In: 240 [P.O.:240] Out: 3700 [Urine:3700] Intake/Output from this shift: No intake/output data recorded.  Labs: Recent Labs    09/30/17 1545 09/30/17 1924 10/01/17 0448 10/02/17 0429  WBC 7.8  --   --   --   HGB 10.3*  --   --   --   HCT 32.7*  --   --   --   PLT 175  --   --   --   CREATININE 1.29*  --  1.26* 1.19*  MG  --  1.3* 1.6*  --    Estimated Creatinine Clearance: 45.7 mL/min (A) (by C-G formula based on SCr of 1.19 mg/dL (H)).   Microbiology: Recent Results (from the past 720 hour(s))  MRSA PCR Screening     Status: Abnormal   Collection Time: 10/01/17  9:37 AM  Result Value Ref Range Status   MRSA  by PCR POSITIVE (A) NEGATIVE Final    Comment:        [name] (FDA approved for NASAL specimens only), is one component of a comprehensive MRSA colonization surveillance program. It is not intended to diagnose MRSA infection nor to guide or monitor treatment for MRSA infections. RESULT CALLED TO, READ BACK BY AND VERIFIED WITH:  Gildardo Pounds AT 1207 10/01/17 SDR Performed at [facility], [address] [place], [place] [address]     Medical History: Past Medical History:  Diagnosis Date  . Arthritis   . Atrial fibrillation, persistent ([place])    a. s/p TEE-guided DCCV on 08/20/2017  . Chest pain    a. 01/2012 Cath: nonobs dzs;  c. 04/2014 MV: No ischemia.  . Chronic back pain   . Chronic combined systolic (congestive) and diastolic (congestive) heart failure ([place])    a. Dx 2005 @ [facility];  b. 02/2014 Echo: Ef 55-60%, no rwma, mild MR, PASP 34mmHg. c. 07/2017: EF 30-35%, diffuse HK, trivial AI, severe MR, moderate TR  . Chronic respiratory failure (H[facility])    a. on home O2  . COPD (chronic obstructive pulmonary disease) ([place])    a. Home O2 - 3lpm  . Gastritis   . History of intracranial hemorrhage    a. 6/14  described by neurosurgery as "small frontal posttraumatic SAH " - pt was on xarelto @ the time.  . Hyperlipidemia   . Hypertension   . Lipoma of back   . Lymphedema    a. Chronic LLE edema.  . Obesity   . Sepsis with metabolic encephalopathy ([place])   . Sleep apnea   . Streptococcal bacteremia   . Stroke ([place])   . Symptomatic bradycardia    a. s/p BSX PPM.  . Thyroid nodule   . Type II diabetes mellitus ([place])   . Urinary incontinence     Medications:  Scheduled:  . albuterol  2.5 mg Nebulization Q6H  . apixaban  5 mg Oral BID  . aspirin EC  81 mg Oral Daily  . furosemide  40 mg Intravenous TID  . gabapentin  200 mg Oral TID  . insulin aspart  0-20 Units Subcutaneous TID WC  . insulin aspart  0-5 Units Subcutaneous QHS  . losartan  25  mg Oral Daily  . metoprolol  200 mg Oral Daily  . mupirocin ointment   Nasal BID  . potassium chloride SA  40 mEq Oral Daily  . protein supplement shake  11 oz Oral BID BM  . rosuvastatin  10 mg Oral Daily  . sildenafil  20 mg Oral TID  . sodium chloride flush  3 mL Intravenous Q12H  . spironolactone  25 mg Oral Daily    Assessment: Pt is a 72 year old female admitted with CHF exacerbation. Pt is being aggressively diuresised. K this AM=2.8, Mg yesterday was 1.6. Pt is also in Afib  Goal of Therapy:  K=4-5 Mg 2-2.4  Plan:  KCL IV 10 MEQ x 5, KCL po 40 MEQ daily Mg IV 4g once Recheck K @ 1600, Mg in the AM  Debra Saunders 10/02/2017,8:12 AM

## 2017-10-03 LAB — GLUCOSE, CAPILLARY
Glucose-Capillary: 141 mg/dL — ABNORMAL HIGH (ref 65–99)
Glucose-Capillary: 166 mg/dL — ABNORMAL HIGH (ref 65–99)
Glucose-Capillary: 201 mg/dL — ABNORMAL HIGH (ref 65–99)
Glucose-Capillary: 326 mg/dL — ABNORMAL HIGH (ref 65–99)

## 2017-10-03 LAB — BASIC METABOLIC PANEL
Anion gap: 10 (ref 5–15)
BUN: 34 mg/dL — ABNORMAL HIGH (ref 6–20)
CO2: 36 mmol/L — ABNORMAL HIGH (ref 22–32)
Calcium: 8.7 mg/dL — ABNORMAL LOW (ref 8.9–10.3)
Chloride: 92 mmol/L — ABNORMAL LOW (ref 101–111)
Creatinine, Ser: 1.44 mg/dL — ABNORMAL HIGH (ref 0.44–1.00)
GFR calc Af Amer: 40 mL/min — ABNORMAL LOW (ref >60)
GFR calc non Af Amer: 35 mL/min — ABNORMAL LOW (ref >60)
Glucose, Bld: 139 mg/dL — ABNORMAL HIGH (ref 65–99)
Potassium: 2.9 mmol/L — ABNORMAL LOW (ref 3.5–5.1)
Sodium: 138 mmol/L (ref 135–145)

## 2017-10-03 LAB — CBC
HCT: 30.5 % — ABNORMAL LOW (ref 35.0–47.0)
Hemoglobin: 9.8 g/dL — ABNORMAL LOW (ref 12.0–16.0)
MCH: 27.5 pg (ref 26.0–34.0)
MCHC: 32.3 g/dL (ref 32.0–36.0)
MCV: 85.1 fL (ref 80.0–100.0)
Platelets: 174 10*3/uL (ref 150–440)
RBC: 3.58 MIL/uL — ABNORMAL LOW (ref 3.80–5.20)
RDW: 16.5 % — ABNORMAL HIGH (ref 11.5–14.5)
WBC: 6.5 10*3/uL (ref 3.6–11.0)

## 2017-10-03 LAB — MAGNESIUM: Magnesium: 2.2 mg/dL (ref 1.7–2.4)

## 2017-10-03 MED ORDER — MAGNESIUM SULFATE 2 GM/50ML IV SOLN
2.0000 g | Freq: Once | INTRAVENOUS | Status: AC
  Administered 2017-10-03: 2 g via INTRAVENOUS
  Filled 2017-10-03: qty 50

## 2017-10-03 MED ORDER — BUDESONIDE 0.5 MG/2ML IN SUSP
0.5000 mg | Freq: Two times a day (BID) | RESPIRATORY_TRACT | Status: DC
  Administered 2017-10-03 – 2017-10-22 (×34): 0.5 mg via RESPIRATORY_TRACT
  Filled 2017-10-03 (×38): qty 2

## 2017-10-03 MED ORDER — SODIUM CHLORIDE 0.9 % IV BOLUS
250.0000 mL | Freq: Once | INTRAVENOUS | Status: AC
  Administered 2017-10-03: 250 mL via INTRAVENOUS

## 2017-10-03 MED ORDER — POTASSIUM CHLORIDE CRYS ER 20 MEQ PO TBCR
40.0000 meq | EXTENDED_RELEASE_TABLET | Freq: Three times a day (TID) | ORAL | Status: DC
  Administered 2017-10-03 (×3): 40 meq via ORAL
  Filled 2017-10-03 (×3): qty 2

## 2017-10-03 MED ORDER — PREDNISONE 20 MG PO TABS
40.0000 mg | ORAL_TABLET | Freq: Every day | ORAL | Status: DC
  Administered 2017-10-03 – 2017-10-05 (×3): 40 mg via ORAL
  Filled 2017-10-03 (×3): qty 2

## 2017-10-03 MED ORDER — FUROSEMIDE 10 MG/ML IJ SOLN: 40.0000 mg | Freq: Two times a day (BID) | INTRAMUSCULAR | Status: DC

## 2017-10-03 NOTE — Plan of Care (Signed)
  Problem: Clinical Measurements: Goal: Respiratory complications will improve Outcome: Progressing   Problem: Activity: Goal: Capacity to carry out activities will improve Outcome: Progressing   Problem: Cardiac: Goal: Ability to achieve and maintain adequate cardiopulmonary perfusion will improve Outcome: Progressing

## 2017-10-03 NOTE — Progress Notes (Signed)
Patient ID: Debra Saunders, female   DOB: Oct 02, 1945, 72 y.o.   MRN: [id number]  [facility] PROGRESS NOTE  Debra Saunders KDX:[id number] DOB: 09/10/1945 DOA: 09/30/2017 PCP: [name], MD  HPI/Subjective: Patient still feels very short of breath with minimal activity.  Still with some cough.  Objective: Vitals:   10/03/17 0855 10/03/17 1148  BP:  (!) 78/56  Pulse:  60  Resp:  14  Temp:  98.5 F (36.9 C)  SpO2: 97% 97%    Filed Weights   10/01/17 0300 10/02/17 0410 10/03/17 0433  Weight: 103.7 kg (228 lb 9.6 oz) 102.7 kg (226 lb 8 oz) 103.3 kg (227 lb 12.8 oz)    ROS: Review of Systems  Constitutional: Negative for chills and fever.  Eyes: Negative for blurred vision.  Respiratory: Positive for cough, shortness of breath and wheezing.   Cardiovascular: Negative for chest pain.  Gastrointestinal: Negative for abdominal pain, constipation, diarrhea, nausea and vomiting.  Genitourinary: Negative for dysuria.  Musculoskeletal: Negative for joint pain.  Neurological: Negative for dizziness and headaches.   Exam: Physical Exam  Constitutional: She is oriented to person, place, and time.  HENT:  Nose: No mucosal edema.  Mouth/Throat: No oropharyngeal exudate or posterior oropharyngeal edema.  Eyes: Pupils are equal, round, and reactive to light. Conjunctivae, EOM and lids are normal.  Neck: No JVD present. Carotid bruit is not present. No edema present. No thyroid mass and no thyromegaly present.  Cardiovascular: S1 normal and S2 normal. An irregularly irregular rhythm present. Exam reveals no gallop.  No murmur heard. Pulses:      Dorsalis pedis pulses are 2+ on the right side, and 2+ on the left side.  Respiratory: No respiratory distress. She has decreased breath sounds in the right middle field, the right lower field, the left middle field and the left lower field. She has no wheezes. She has rhonchi in the right lower field and the left lower field. She has  no rales.  GI: Soft. Bowel sounds are normal. There is no tenderness.  Musculoskeletal:       Right ankle: She exhibits swelling.       Left ankle: She exhibits swelling.  Lymphadenopathy:    She has no cervical adenopathy.  Neurological: She is alert and oriented to person, place, and time. No cranial nerve deficit.  Skin: Skin is warm. No rash noted. Nails show no clubbing.  Psychiatric: She has a normal mood and affect.      Data Reviewed: Basic Metabolic Panel: Recent Labs  Lab 09/30/17 1545 09/30/17 1924 10/01/17 0448 10/02/17 0429 10/02/17 1557 10/03/17 0449  NA 140  --  141 140  --  138  K 3.3*  --  2.9* 2.8* 3.6 2.9*  CL 104  --  103 95*  --  92*  CO2 28  --  32 37*  --  36*  GLUCOSE 146*  --  116* 135*  --  139*  BUN 28*  --  26* 31*  --  34*  CREATININE 1.29*  --  1.26* 1.19*  --  1.44*  CALCIUM 9.2  --  9.1 9.1  --  8.7*  MG  --  1.3* 1.6*  --   --  2.2   Liver Function Tests: No results for input(s): AST, ALT, ALKPHOS, BILITOT, PROT, ALBUMIN in the last 168 hours. No results for input(s): LIPASE, AMYLASE in the last 168 hours. No results for input(s): AMMONIA in the last 168 hours. CBC:  Recent Labs  Lab 09/30/17 1545 10/03/17 0449  WBC 7.8 6.5  HGB 10.3* 9.8*  HCT 32.7* 30.5*  MCV 85.1 85.1  PLT 175 174   Cardiac Enzymes: Recent Labs  Lab 09/30/17 1545  TROPONINI 0.04*   BNP (last 3 results) Recent Labs    08/04/17 1552 08/16/17 0925 09/30/17 1545  BNP 175.0* 655.0* 1,151.0*     CBG: Recent Labs  Lab 10/02/17 1148 10/02/17 1657 10/02/17 2041 10/03/17 0740 10/03/17 1147  GLUCAP 170* 209* 144* 141* 166*    Recent Results (from the past 240 hour(s))  MRSA PCR Screening     Status: Abnormal   Collection Time: 10/01/17  9:37 AM  Result Value Ref Range Status   MRSA by PCR POSITIVE (A) NEGATIVE Final    Comment:        [name] (FDA approved for NASAL specimens only), is one component of a comprehensive MRSA  colonization surveillance program. It is not intended to diagnose MRSA infection nor to guide or monitor treatment for MRSA infections. RESULT CALLED TO, READ BACK BY AND VERIFIED WITH:  Gildardo Pounds AT 1207 10/01/17 SDR Performed at [facility], [address] [place], [place] [address]       Scheduled Meds: . albuterol  2.5 mg Nebulization Q6H  . amiodarone  400 mg Oral BID  . apixaban  5 mg Oral BID  . aspirin EC  81 mg Oral Daily  . budesonide (PULMICORT) nebulizer solution  0.5 mg Nebulization BID  . furosemide  40 mg Intravenous TID  . gabapentin  200 mg Oral TID  . insulin aspart  0-20 Units Subcutaneous TID WC  . insulin aspart  0-5 Units Subcutaneous QHS  . losartan  25 mg Oral Daily  . metoprolol  200 mg Oral Daily  . mupirocin ointment   Nasal BID  . potassium chloride SA  40 mEq Oral TID  . predniSONE  40 mg Oral Q breakfast  . protein supplement shake  11 oz Oral BID BM  . rosuvastatin  10 mg Oral Daily  . sildenafil  20 mg Oral TID  . sodium chloride flush  3 mL Intravenous Q12H  . spironolactone  25 mg Oral Daily   Continuous Infusions: . sodium chloride    . magnesium sulfate 1 - 4 g bolus IVPB 2 g (10/03/17 1214)    Assessment/Plan:  1. Acute on chronic diastolic congestive heart failure.  Patient has moderate to severe mitral valve regurgitation.  Patient is days on high-dose Lasix IV, Spironolactone, losartan and metoprolol 2. Chronic atrial fibrillation on Eliquis and  Metoprolol and amiodarone. 3. Chronic hypoxic hypercarbic respiratory failure.  Chronically on 4 L of oxygen. 4. Relative hypotension.  Likely will have to cut back on medication.  Check another blood pressure and hold this afternoon his Lasix dose.  Hopefully can give Lasix this evening. 5. Morbid obesity.  Weight loss needed 6. COPD exacerbation, continue nebulizer treatments.  Start prednisone. 7. Hyperlipidemia unspecified on Crestor 8. Type 2 diabetes mellitus on  sliding scale insulin 9. Chronic kidney disease stage III watch closely with diuresis.  Code Status:     Code Status Orders  (From admission, onward)        Start     Ordered   09/30/17 1901  Full code  Continuous     09/30/17 1901    Code Status History    Date Active Date Inactive Code Status Order ID Comments User Context  08/16/2017 1614 08/28/2017 2239 Full Code [id number]  Gorden Harms, MD Inpatient   03/30/2015 2152 04/03/2015 2122 Full Code [id number]  Etta Quill, DO Inpatient   03/27/2015 2208 03/28/2015 1737 Full Code [id number]  Nicholes Mango, MD ED   01/09/2014 1418 01/10/2014 1803 Full Code [id number]  Deboraha Sprang, MD Inpatient   01/16/2013 2335 01/27/2013 2011 Full Code [id number]  Collene Gobble, MD Inpatient     Disposition Plan: Patient will need to breathe better prior to disposition  Consultants:  Cardiology  Time spent: 28 minutes  [place]

## 2017-10-03 NOTE — Progress Notes (Signed)
MD [name] made aware of BP of 88/58. Per MD, hold bedtime amiodarone. Will continue to monitor.   Debra Saunders

## 2017-10-03 NOTE — Progress Notes (Signed)
MD paged to update patients blood pressure of 84/60. Per verbal orders, hold this evening dose of lasix, ok to give sildenafil, give 250cc sodium chlorde 0.9% bolus over 2 hours, and change daily weight to standing scale only. Verbal read back. Patient sitting in the chair. Will continue to monitor patient.

## 2017-10-03 NOTE — Progress Notes (Signed)
Progress Note  Patient Name: Debra Saunders Date of Encounter: 10/03/2017  Primary Cardiologist:   [name], MD   Subjective   No chest pain.  Breathing is better but not at baseline.   Inpatient Medications    Scheduled Meds: . albuterol  2.5 mg Nebulization Q6H  . amiodarone  400 mg Oral BID  . apixaban  5 mg Oral BID  . aspirin EC  81 mg Oral Daily  . budesonide (PULMICORT) nebulizer solution  0.5 mg Nebulization BID  . furosemide  40 mg Intravenous TID  . gabapentin  200 mg Oral TID  . insulin aspart  0-20 Units Subcutaneous TID WC  . insulin aspart  0-5 Units Subcutaneous QHS  . losartan  25 mg Oral Daily  . metoprolol  200 mg Oral Daily  . mupirocin ointment   Nasal BID  . potassium chloride SA  40 mEq Oral TID  . predniSONE  40 mg Oral Q breakfast  . protein supplement shake  11 oz Oral BID BM  . rosuvastatin  10 mg Oral Daily  . sildenafil  20 mg Oral TID  . sodium chloride flush  3 mL Intravenous Q12H  . spironolactone  25 mg Oral Daily   Continuous Infusions: . sodium chloride    . magnesium sulfate 1 - 4 g bolus IVPB     PRN Meds: sodium chloride, acetaminophen, ALPRAZolam, ondansetron (ZOFRAN) IV, ondansetron, oxyCODONE-acetaminophen, promethazine, sodium chloride flush   Vital Signs    Vitals:   10/03/17 0433 10/03/17 0739 10/03/17 0855 10/03/17 1148  BP: 106/67 117/63  (!) 78/56  Pulse: 83 71  60  Resp: 18 18  14   Temp: 97.6 F (36.4 C) 97.8 F (36.6 C)  98.5 F (36.9 C)  TempSrc: Oral Oral  Oral  SpO2: 94% 98% 97% 97%  Weight: 227 lb 12.8 oz (103.3 kg)     Height:        Intake/Output Summary (Last 24 hours) at 10/03/2017 1156 Last data filed at 10/03/2017 0851 Gross per 24 hour  Intake 3 ml  Output 1700 ml  Net -1697 ml   Filed Weights   10/01/17 0300 10/02/17 0410 10/03/17 0433  Weight: 228 lb 9.6 oz (103.7 kg) 226 lb 8 oz (102.7 kg) 227 lb 12.8 oz (103.3 kg)    Telemetry    Atrial fib with ventricular pacing -  Personally Reviewed  ECG     - Personally Reviewed  Physical Exam   GEN: No acute distress.   Neck: No  JVD Cardiac: Irregular RR, no murmurs, rubs, or gallops.  Respiratory: Clear  to auscultation bilaterally. GI: Soft, nontender, non-distended  MS: No  edema; No deformity. Neuro:  Nonfocal  Psych: Normal affect   Labs    Chemistry Recent Labs  Lab 10/01/17 0448 10/02/17 0429 10/02/17 1557 10/03/17 0449  NA 141 140  --  138  K 2.9* 2.8* 3.6 2.9*  CL 103 95*  --  92*  CO2 32 37*  --  36*  GLUCOSE 116* 135*  --  139*  BUN 26* 31*  --  34*  CREATININE 1.26* 1.19*  --  1.44*  CALCIUM 9.1 9.1  --  8.7*  GFRNONAA 41* 44*  --  35*  GFRAA 47* 51*  --  40*  ANIONGAP 6 8  --  10     Hematology Recent Labs  Lab 09/30/17 1545 10/03/17 0449  WBC 7.8 6.5  RBC 3.84 3.58*  HGB 10.3* 9.8*  HCT 32.7* 30.5*  MCV 85.1 85.1  MCH 26.9 27.5  MCHC 31.6* 32.3  RDW 17.1* 16.5*  PLT 175 174    Cardiac Enzymes Recent Labs  Lab 09/30/17 1545  TROPONINI 0.04*   No results for input(s): TROPIPOC in the last 168 hours.   BNP Recent Labs  Lab 09/30/17 1545  BNP 1,151.0*     DDimer No results for input(s): DDIMER in the last 168 hours.   Radiology    No results found.  Cardiac Studies   ECHO:  10/01/17  Study Conclusions  - Left ventricle: The cavity size was normal. There was mild   concentric hypertrophy. Systolic function was normal. The   estimated ejection fraction was in the range of 50% to 55%. Wall   motion was normal; there were no regional wall motion   abnormalities. The study is not technically sufficient to allow   evaluation of LV diastolic function. - Mitral valve: There was moderate to severe regurgitation. - Left atrium: The atrium was moderately dilated. - Right ventricle: The cavity size was mildly dilated. Wall   thickness was normal. Systolic function was mildly reduced. - Right atrium: The atrium was moderately dilated. - Tricuspid  valve: There was moderate regurgitation. - Pulmonary arteries: Systolic pressure was severely elevated PA   peak pressure: 69 mm Hg (S).  Patient Profile     72 y.o. female with a history of nonobstructive CAD, chronic combined systolic and diastolic congestive heart failure, persistent atrial fibrilation, symptomatic bradycardia status post permanent pacemaker, frequent PVCs, chronic respiratory failure with hypoxia on home O2, COPD, hypertension, hyperlipidemia, obesity, type 2 diabetes mellitus, traumatic subarachnoid hemorrhage in the setting of Xarelto, left lower extremity DVT status post IVC filter, chronic right sided atypical chest pain, chronic left lower extremity lymphedema, prior stroke, sleep apnea, and obesity, who is being seen for the evaluation of acute on chronic combined heart failure in the setting of persistent atrial fibrillation at the request of Dr. Posey Pronto.   Assessment & Plan    ACUTE ON CHRONIC DIASTOLICY HF:    Down 5.1 liters.  Weight down about 8 lbs.  She is breathing better but not at baseline.  I am going to continue the current diuretic.  However, we will need to watch the creat closely.  I have asked her to keep her feet elevated.     PERSISTENT ATRIAL FIB:  Continue amiodarone.  Continue Eliquis.    AKI:  Creat is up slightly.   Follow again tomorrow.     SEVERE MR:  Follow up echo after we have medically optimized her volume and hopefully convert to and maintained atrial fib.     For questions or updates, please contact [place] Please consult [email or web address] for contact info under Cardiology/STEMI.   Signed, Minus Breeding, MD  10/03/2017, 11:56 AM

## 2017-10-03 NOTE — Evaluation (Signed)
Physical Therapy Evaluation Patient Details Name: Debra Saunders MRN: [id number] DOB: 02-12-1946 Today's Date: 10/03/2017 72 yo female was admitted for SOB and CHF exacerbation with 4LO2 used for supplement.  Mild increase in troponin and passing blood with abd pain complaints, and PMHx:  pacemaker, stroke, CKD 3, DM, a-fib, COPD, prev smoker, lymphedema, severe mitral regurg    History of Present Illness  72 yo female was admitted for SOB and CHF exacerbation with 4LO2 used for supplement.  Mild increase in troponin and passing blood with abd pain complaints, and PMHx:  pacemaker, stroke, CKD 3, DM, a-fib, COPD, prev smoker, lymphedema, severe mitral regurg  Clinical Impression  Pt is demonstrating GI bleed duringtherapy stay and is progressively more weak, SOB and struggling.  Will progress pt as she is able , and focus on balance and strength in her therapy routine. Follow acutely for these goals to assist progression faster in SNF and to increase her awareness of her symptoms.     Follow Up Recommendations Home health PT;Supervision for mobility/OOB    Equipment Recommendations  Rolling walker with 5" wheels;Wheelchair (measurements PT)    Recommendations for Other Services       Precautions / Restrictions Precautions Precautions: Fall(telemetry) Restrictions Weight Bearing Restrictions: No      Mobility  Bed Mobility Overal bed mobility: Needs Assistance Bed Mobility: Supine to Sit;Sit to Supine     Supine to sit: Min assist Sit to supine: Min assist      Transfers Overall transfer level: Needs assistance Equipment used: Rolling walker (2 wheeled) Transfers: Sit to/from [facility] Sit to Stand: Min assist Stand pivot transfers: Min assist          Ambulation/Gait Ambulation/Gait assistance: Min assist Ambulation Distance (Feet): 5 Feet Assistive device: Rolling walker (2 wheeled);1 person hand held assist Gait Pattern/deviations:  Step-through pattern;Shuffle;Decreased stride length Gait velocity: reduced Gait velocity interpretation: 1.31 - 2.62 ft/sec, indicative of limited community ambulator General Gait Details: pt was seen to do bed to chair  Stairs            Wheelchair Mobility    Modified Rankin (Stroke Patients Only)       Balance Overall balance assessment: Needs assistance;History of Falls Sitting-balance support: Feet supported Sitting balance-Leahy Scale: Fair     Standing balance support: Bilateral upper extremity supported;During functional activity Standing balance-Leahy Scale: Fair                               Pertinent Vitals/Pain Pain Assessment: 0-10 Pain Score: 7  Pain Location: abdomen Pain Descriptors / Indicators: Cramping Pain Intervention(s): Limited activity within patient's tolerance;Monitored during session;Premedicated before session;Repositioned    Home Living Family/patient expects to be discharged to:: Private residence Living Arrangements: Alone Available Help at Discharge: Family;Friend(s);Available PRN/intermittently Type of Home: House Home Access: Ramped entrance     Home Layout: One level Home Equipment: Walker - 2 wheels;Walker - 4 wheels      Prior Function Level of Independence: Independent with assistive device(s)         Comments: Pt only out of the house for MD appointments states family checks in occasionally.  Has groceries delivered.     Hand Dominance        Extremity/Trunk Assessment   Upper Extremity Assessment Upper Extremity Assessment: Overall WFL for tasks assessed    Lower Extremity Assessment Lower Extremity Assessment: Overall WFL for tasks assessed(strength [facility] but unsteady and  dizzy to transfer)    Cervical / Trunk Assessment Cervical / Trunk Assessment: Normal  Communication   Communication: No difficulties  Cognition Arousal/Alertness: Awake/alert Behavior During Therapy: WFL for tasks  assessed/performed Overall Cognitive Status: Within Functional Limits for tasks assessed                                        General Comments      Exercises     Assessment/Plan    PT Assessment Patient needs continued PT services  PT Problem List Decreased strength;Decreased activity tolerance;Decreased range of motion;Decreased balance;Decreased mobility;Decreased coordination;Decreased knowledge of use of DME;Decreased safety awareness;Cardiopulmonary status limiting activity;Decreased skin integrity       PT Treatment Interventions DME instruction;Gait training;Stair training;Functional mobility training;Therapeutic activities;Therapeutic exercise;Balance training;Neuromuscular re-education;Cognitive remediation    PT Goals (Current goals can be found in the Care Plan section)  Acute Rehab PT Goals Patient Stated Goal: To recover strength and go home PT Goal Formulation: With patient Time For Goal Achievement: 10/17/17 Potential to Achieve Goals: Good    Frequency Min 2X/week   Barriers to discharge Decreased caregiver support home alone    Co-evaluation               AM-PAC PT "6 Clicks" Daily Activity  Outcome Measure Difficulty turning over in bed (including adjusting bedclothes, sheets and blankets)?: Unable Difficulty moving from lying on back to sitting on the side of the bed? : Unable Difficulty sitting down on and standing up from a chair with arms (e.g., wheelchair, bedside commode, etc,.)?: Unable Help needed moving to and from a bed to chair (including a wheelchair)?: Total Help needed walking in hospital room?: A Little Help needed climbing 3-5 steps with a railing? : Total 6 Click Score: 8    End of Session Equipment Utilized During Treatment: Gait belt;Other (comment)(BSC) Activity Tolerance: Patient tolerated treatment well;No increased pain;Patient limited by fatigue Patient left: in chair;with call bell/phone within  reach;with chair alarm set Nurse Communication: Mobility status PT Visit Diagnosis: Unsteadiness on feet (R26.81);Other abnormalities of gait and mobility (R26.89);Muscle weakness (generalized) (M62.81);History of falling (Z91.81);Adult, failure to thrive (R62.7);Dizziness and giddiness (R42)    Time: 6045-4098 PT Time Calculation (min) (ACUTE ONLY): 38 min   Charges:   PT Evaluation $PT Eval Moderate Complexity: 1 Mod     PT G Codes:   PT G-Codes **NOT FOR INPATIENT CLASS** Functional Assessment Tool Used: AM-PAC 6 Clicks Basic Mobility    Ramond Dial 10/03/2017, 11:27 PM   Mee Hives, PT MS Acute Rehab Dept. Number: [place] and [place]

## 2017-10-03 NOTE — Progress Notes (Signed)
Patient complaining of dizziness, MD paged and notified. No new orders, will continue to monitor patient. Patient's blood pressure 78/56 earlier, now 126/94. MD notified of these numbers. Will continue to monitor patient.

## 2017-10-04 ENCOUNTER — Inpatient Hospital Stay: Payer: Medicare HMO

## 2017-10-04 DIAGNOSIS — I5031 Acute diastolic (congestive) heart failure: Secondary | ICD-10-CM

## 2017-10-04 LAB — BASIC METABOLIC PANEL
Anion gap: 8 (ref 5–15)
BUN: 52 mg/dL — ABNORMAL HIGH (ref 6–20)
CO2: 32 mmol/L (ref 22–32)
Calcium: 8.3 mg/dL — ABNORMAL LOW (ref 8.9–10.3)
Chloride: 92 mmol/L — ABNORMAL LOW (ref 101–111)
Creatinine, Ser: 3.1 mg/dL — ABNORMAL HIGH (ref 0.44–1.00)
GFR calc Af Amer: 16 mL/min — ABNORMAL LOW (ref >60)
GFR calc non Af Amer: 14 mL/min — ABNORMAL LOW (ref >60)
Glucose, Bld: 315 mg/dL — ABNORMAL HIGH (ref 65–99)
Potassium: 5.2 mmol/L — ABNORMAL HIGH (ref 3.5–5.1)
Sodium: 132 mmol/L — ABNORMAL LOW (ref 135–145)

## 2017-10-04 LAB — GLUCOSE, CAPILLARY
Glucose-Capillary: 252 mg/dL — ABNORMAL HIGH (ref 65–99)
Glucose-Capillary: 240 mg/dL — ABNORMAL HIGH (ref 65–99)
Glucose-Capillary: 293 mg/dL — ABNORMAL HIGH (ref 65–99)
Glucose-Capillary: 302 mg/dL — ABNORMAL HIGH (ref 65–99)

## 2017-10-04 MED ORDER — METOPROLOL SUCCINATE ER 50 MG PO TB24
50.0000 mg | ORAL_TABLET | Freq: Every day | ORAL | Status: DC
  Filled 2017-10-04: qty 1

## 2017-10-04 MED ORDER — SODIUM CHLORIDE 0.9 % IV SOLN
INTRAVENOUS | Status: DC
  Administered 2017-10-04 – 2017-10-06 (×4): via INTRAVENOUS

## 2017-10-04 MED ORDER — APIXABAN 2.5 MG PO TABS
2.5000 mg | ORAL_TABLET | Freq: Two times a day (BID) | ORAL | Status: DC
  Administered 2017-10-04 – 2017-10-05 (×3): 2.5 mg via ORAL
  Filled 2017-10-04 (×3): qty 1

## 2017-10-04 NOTE — Consult Note (Signed)
[facility] CONSULT NOTE    Date: 10/04/2017                  Patient Name:  Debra Saunders  MRN: [id number]  DOB: 08-Apr-1946  Age / Sex: 72 y.o., female         PCP: [name], MD                 Service Requesting Consult: Hospitalist                 Reason for Consult: Acute renal failure/CKD stage III            History of Present Illness: Patient is a 72 y.o. female with a PMHx of chronic diastolic heart failure, atrial fibrillation, chronic hypercarbic respiratory failure, COPD, morbid obesity, hyperlipidemia, diabetes mellitus type 2, chronic kidney disease stage II, history of intracranial hemorrhage who was admitted to [facility] on 09/30/2017 for evaluation of shortness of breath.  She was admitted for COPD exacerbation as well as acute on chronic diastolic heart failure exacerbation.  She was started on IV diuretics and was also on spironolactone as well as losartan.  These have been discontinued.  She was also relatively hypotensive yesterday.  She has been started on IV fluid hydration with 0.9 normal saline at 50 cc/h.  Overall her shortness of breath has improved as compared to admission.  She currently denies nausea, vomiting, or dyscrasia.   Medications: Outpatient medications: Medications Prior to Admission  Medication Sig Dispense Refill Last Dose  . acetaminophen (TYLENOL) 325 MG tablet Take 325-650 mg by mouth every 6 (six) hours as needed for mild pain. Reported on 10/10/2015   PRN at PRN  . albuterol (PROVENTIL) (2.5 MG/3ML) 0.083% nebulizer solution Take 3 mLs (2.5 mg total) by nebulization every 6 (six) hours. 75 mL 12 PRN at PRN  . apixaban (ELIQUIS) 5 MG TABS tablet Take 1 tablet (5 mg total) by mouth 2 (two) times daily. 180 tablet 1 09/30/2017 at 0800  . Dulaglutide ([name]) 7.25 DG/6.4QI SOPN Inject 0.75 mg into the skin once a week. 2 pen 0 09/27/2017 at Unkwn  . gabapentin (NEURONTIN) 100 MG capsule TAKE 2 CAPSULES BY MOUTH THREE  TIMES DAILY 180 capsule 3 09/30/2017 at 0800  . glucose blood (TRUE METRIX BLOOD GLUCOSE TEST) test strip Check blood glucose twice daily Dx:E11.8, Z79.4 100 each 12 As directed at As directed  . losartan (COZAAR) 25 MG tablet Take 1 tablet (25 mg total) by mouth daily. 30 tablet 0 09/30/2017 at 0800  . Menthol, Topical Analgesic, (BIOFREEZE EX) Apply 1 application topically as needed (for pain).    PRN at PRN  . metFORMIN (GLUCOPHAGE) 500 MG tablet Take 1,000 mg by mouth 2 (two) times daily with a meal.   09/30/2017 at 0800  . metoprolol (TOPROL-XL) 200 MG 24 hr tablet Take 1 tablet (200 mg total) by mouth daily. Take with or immediately following a meal. 90 tablet 1 09/30/2017 at 0800  . ondansetron (ZOFRAN) 8 MG tablet Take 1 tablet (8 mg total) by mouth every 8 (eight) hours as needed for nausea or vomiting. 30 tablet 2 PRN at PRN  . oxyCODONE-acetaminophen (PERCOCET/ROXICET) 5-325 MG tablet Take 1 tablet by mouth every 8 (eight) hours as needed for severe pain. 12 tablet 0 PRN at PRN  . potassium chloride SA (K-DUR,KLOR-CON) 20 MEQ tablet Take 1 tablet (20 mEq total) by mouth daily. 30 tablet 0 09/30/2017 at 0800  .  promethazine (PHENERGAN) 25 MG tablet Take 25 mg by mouth every 4 (four) hours as needed for nausea or vomiting. Reported on 10/10/2015   PRN at PRN  . protein supplement shake (PREMIER PROTEIN) LIQD Take 325 mLs (11 oz total) by mouth 2 (two) times daily between meals. 60 Can 0 As directed at As directed  . rosuvastatin (CRESTOR) 10 MG tablet Take 1 tablet (10 mg total) by mouth daily. 30 tablet 3 09/30/2017 at 0800  . sildenafil (REVATIO) 20 MG tablet Take 20 mg by mouth 3 (three) times daily.   09/30/2017 at 0800  . spironolactone (ALDACTONE) 25 MG tablet Take 0.5 tablets (12.5 mg total) by mouth daily. 15 tablet 0 09/30/2017 at 0800  . torsemide (DEMADEX) 20 MG tablet Take 1 tablet (20 mg total) by mouth daily. (Patient taking differently: Take 20MG  by mouth and alternate with 40MG  every  other day)   09/30/2017 at 0800    Current medications: Current Facility-Administered Medications  Medication Dose Route Frequency Provider Last Rate Last Dose  . 0.9 %  sodium chloride infusion  250 mL Intravenous PRN Salary, Montell D, MD      . 0.9 %  sodium chloride infusion   Intravenous Continuous Loletha Grayer, MD 50 mL/hr at 10/04/17 0813    . acetaminophen (TYLENOL) tablet 650 mg  650 mg Oral Q4H PRN Salary, Holly Bodily D, MD   650 mg at 10/01/17 1758  . albuterol (PROVENTIL) (2.5 MG/3ML) 0.083% nebulizer solution 2.5 mg  2.5 mg Nebulization Q6H Dustin Flock, MD   2.5 mg at 10/04/17 0711  . ALPRAZolam (XANAX) tablet 0.25 mg  0.25 mg Oral BID PRN Salary, Montell D, MD      . amiodarone (PACERONE) tablet 400 mg  400 mg Oral BID Theora Gianotti, NP   400 mg at 10/04/17 0817  . apixaban (ELIQUIS) tablet 2.5 mg  2.5 mg Oral BID Loletha Grayer, MD   2.5 mg at 10/04/17 0817  . aspirin EC tablet 81 mg  81 mg Oral Daily Salary, Montell D, MD      . budesonide (PULMICORT) nebulizer solution 0.5 mg  0.5 mg Nebulization BID Loletha Grayer, MD   0.5 mg at 10/04/17 0841  . gabapentin (NEURONTIN) capsule 200 mg  200 mg Oral TID Loney Hering D, MD   200 mg at 10/04/17 0816  . insulin aspart (novoLOG) injection 0-20 Units  0-20 Units Subcutaneous TID WC Loney Hering D, MD   7 Units at 10/04/17 1202  . insulin aspart (novoLOG) injection 0-5 Units  0-5 Units Subcutaneous QHS Loney Hering D, MD   4 Units at 10/03/17 2152  . mupirocin ointment (BACTROBAN) 2 %   Nasal BID Dustin Flock, MD      . ondansetron [facility]) injection 4 mg  4 mg Intravenous Q6H PRN Salary, Montell D, MD   4 mg at 10/03/17 1213  . ondansetron (ZOFRAN) tablet 8 mg  8 mg Oral Q8H PRN Salary, Montell D, MD      . oxyCODONE-acetaminophen (PERCOCET/ROXICET) 5-325 MG per tablet 1 tablet  1 tablet Oral Q8H PRN Salary, Montell D, MD      . predniSONE (DELTASONE) tablet 40 mg  40 mg Oral Q breakfast Loletha Grayer, MD   40 mg at 10/04/17 0816  . promethazine (PHENERGAN) tablet 25 mg  25 mg Oral Q4H PRN Salary, Montell D, MD      . protein supplement (PREMIER PROTEIN) liquid  11 oz Oral BID BM Salary, Avel Peace, MD  11 oz at 10/03/17 1213  . rosuvastatin (CRESTOR) tablet 10 mg  10 mg Oral Daily Salary, Montell D, MD   10 mg at 10/04/17 0816  . sildenafil (REVATIO) tablet 20 mg  20 mg Oral TID Loney Hering D, MD   20 mg at 10/04/17 0816  . sodium chloride flush (NS) 0.9 % injection 3 mL  3 mL Intravenous Q12H Salary, Montell D, MD   3 mL at 10/04/17 0817  . sodium chloride flush (NS) 0.9 % injection 3 mL  3 mL Intravenous PRN Salary, Avel Peace, MD          Allergies: Allergies  Allergen Reactions  . Aspirin Hives and Other (See Comments)    Pt states that she is unable to take because she had bleeding in her brain.    . Other Other (See Comments)    Pt states that she is unable to take blood thinners because she had bleeding in her brain.  Blood Thinners-per doctor at [facility]  . Penicillins Hives, Shortness Of Breath, Swelling and Other (See Comments)    Has patient had a PCN reaction causing immediate rash, facial/tongue/throat swelling, SOB or lightheadedness with hypotension: Yes Has patient had a PCN reaction causing severe rash involving mucus membranes or skin necrosis: No Has patient had a PCN reaction that required hospitalization No Has patient had a PCN reaction occurring within the last 10 years: No If all of the above answers are "NO", then may proceed with Cephalosporin use.      Past Medical History: Past Medical History:  Diagnosis Date  . Arthritis   . Atrial fibrillation, persistent ([place])    a. s/p TEE-guided DCCV on 08/20/2017  . Chest pain    a. 01/2012 Cath: nonobs dzs;  c. 04/2014 MV: No ischemia.  . Chronic back pain   . Chronic combined systolic (congestive) and diastolic (congestive) heart failure ([place])    a. Dx 2005 @ [facility];  b. 02/2014 Echo: Ef 55-60%, no  rwma, mild MR, PASP 89mmHg. c. 07/2017: EF 30-35%, diffuse HK, trivial AI, severe MR, moderate TR  . Chronic respiratory failure (H[facility])    a. on home O2  . COPD (chronic obstructive pulmonary disease) ([place])    a. Home O2 - 3lpm  . Gastritis   . History of intracranial hemorrhage    a. 6/14 described by neurosurgery as "small frontal posttraumatic SAH " - pt was on xarelto @ the time.  . Hyperlipidemia   . Hypertension   . Lipoma of back   . Lymphedema    a. Chronic LLE edema.  . Obesity   . Sepsis with metabolic encephalopathy ([place])   . Sleep apnea   . Streptococcal bacteremia   . Stroke ([place])   . Symptomatic bradycardia    a. s/p BSX PPM.  . Thyroid nodule   . Type II diabetes mellitus ([place])   . Urinary incontinence      Past Surgical History: Past Surgical History:  Procedure Laterality Date  . ABDOMINAL HYSTERECTOMY  1969  . CARDIAC CATHETERIZATION  2013   @ [place]: No obstructive CAD: Only 20% ostial left circumflex.  . CHOLECYSTECTOMY    . CYST REMOVAL TRUNK  2002   BACK  . INSERT / REPLACE / REMOVE PACEMAKER    . lipoma removal  2014   back  . PACEMAKER INSERTION  [address] Scientific dual chamber pacemaker implanted by Dr Caryl Comes for symptomatic bradycardia  . PERMANENT PACEMAKER INSERTION N/A 01/09/2014  Procedure: PERMANENT PACEMAKER INSERTION;  Surgeon: Deboraha Sprang, MD;  Location: [facility] CATH LAB;  Service: Cardiovascular;  Laterality: N/A;  . TEE WITHOUT CARDIOVERSION N/A 08/20/2017   Procedure: TRANSESOPHAGEAL ECHOCARDIOGRAM (TEE) & Direct current cardioversion;  Surgeon: Minna Merritts, MD;  Location: [facility] ORS;  Service: Cardiovascular;  Laterality: N/A;  . TRIGGER FINGER RELEASE    . VAGINAL HYSTERECTOMY       Family History: Family History  Problem Relation Age of Onset  . Heart disease Mother   . Hypertension Mother   . COPD Mother        was a smoker  . Thyroid disease Mother   . Hypertension Father   . Hypertension Sister   . Thyroid  disease Sister   . Hypertension Sister   . Thyroid disease Sister   . Breast cancer Maternal Aunt   . Kidney disease Neg Hx   . Bladder Cancer Neg Hx      Social History: Social History   Socioeconomic History  . Marital status: Divorced    Spouse name: Not on file  . Number of children: Not on file  . Years of education: Not on file  . Highest education level: Not on file  Occupational History  . Occupation: Retired- factory work    Comment: exposed to [place]  . Financial resource strain: Not on file  . Food insecurity:    Worry: Not on file    Inability: Not on file  . Transportation needs:    Medical: Not on file    Non-medical: Not on file  Tobacco Use  . Smoking status: Former Smoker    Packs/day: 0.30    Years: 2.00    Pack years: 0.60    Types: Cigarettes    Last attempt to quit: 06/23/1990    Years since quitting: 27.3  . Smokeless tobacco: Never Used  Substance and Sexual Activity  . Alcohol use: No  . Drug use: No  . Sexual activity: Not on file  Lifestyle  . Physical activity:    Days per week: Not on file    Minutes per session: Not on file  . Stress: Not on file  Relationships  . Social connections:    Talks on phone: Not on file    Gets together: Not on file    Attends religious service: Not on file    Active member of club or organization: Not on file    Attends meetings of clubs or organizations: Not on file    Relationship status: Not on file  . Intimate partner violence:    Fear of current or ex partner: Not on file    Emotionally abused: Not on file    Physically abused: Not on file    Forced sexual activity: Not on file  Other Topics Concern  . Not on file  Social History Narrative   Lives in [place] with daughter. Has 4 children. No pets.      Work - Restaurant manager, fast food, retired      Diet - regular     Review of Systems: Review of Systems  Constitutional: Positive for malaise/fatigue. Negative for chills and  fever.  HENT: Negative for congestion, hearing loss and nosebleeds.   Eyes: Negative for blurred vision and double vision.  Respiratory: Positive for shortness of breath. Negative for cough and hemoptysis.   Cardiovascular: Positive for orthopnea, leg swelling and PND. Negative for chest pain and palpitations.  Gastrointestinal: Negative for abdominal pain,  heartburn, nausea and vomiting.  Genitourinary: Negative for dysuria and urgency.  Musculoskeletal: Negative for joint pain and myalgias.  Skin: Negative for itching and rash.  Neurological: Negative for dizziness and focal weakness.  Endo/Heme/Allergies: Negative for polydipsia. Does not bruise/bleed easily.  Psychiatric/Behavioral: Negative for depression. The patient is not nervous/anxious.      Vital Signs: Blood pressure 95/65, pulse 61, temperature 97.7 F (36.5 C), resp. rate 18, height 5\' 1"  (1.549 m), weight 104.2 kg (229 lb 11.2 oz), SpO2 100 %.  Weight trends: Filed Weights   10/02/17 0410 10/03/17 0433 10/04/17 0433  Weight: 102.7 kg (226 lb 8 oz) 103.3 kg (227 lb 12.8 oz) 104.2 kg (229 lb 11.2 oz)    Physical Exam: General: NAD, sitting up in bed  Head: Normocephalic, atraumatic.  Eyes: Anicteric, EOMI  Nose: Mucous membranes moist, not inflammed, nonerythematous.  Throat: Oropharynx nonerythematous, no exudate appreciated.   Neck: Supple, trachea midline.  Lungs:  Normal respiratory effort.  Minimal basilar rales  Heart: Irregular no rubs  Abdomen:  BS normoactive. Soft, Nondistended, non-tender.  No masses or organomegaly.  Extremities: 1+ pretibial edema.  Neurologic: A&O X3, Motor strength is 5/5 in the all 4 extremities  Skin: No visible rashes, scars.    Lab results: Basic Metabolic Panel: Recent Labs  Lab 09/30/17 1924 10/01/17 0448 10/02/17 0429 10/02/17 1557 10/03/17 0449 10/04/17 0552  NA  --  141 140  --  138 132*  K  --  2.9* 2.8* 3.6 2.9* 5.2*  CL  --  103 95*  --  92* 92*  CO2  --  32  37*  --  36* 32  GLUCOSE  --  116* 135*  --  139* 315*  BUN  --  26* 31*  --  34* 52*  CREATININE  --  1.26* 1.19*  --  1.44* 3.10*  CALCIUM  --  9.1 9.1  --  8.7* 8.3*  MG 1.3* 1.6*  --   --  2.2  --     Liver Function Tests: No results for input(s): AST, ALT, ALKPHOS, BILITOT, PROT, ALBUMIN in the last 168 hours. No results for input(s): LIPASE, AMYLASE in the last 168 hours. No results for input(s): AMMONIA in the last 168 hours.  CBC: Recent Labs  Lab 09/30/17 1545 10/03/17 0449  WBC 7.8 6.5  HGB 10.3* 9.8*  HCT 32.7* 30.5*  MCV 85.1 85.1  PLT 175 174    Cardiac Enzymes: Recent Labs  Lab 09/30/17 1545  TROPONINI 0.04*    BNP: Invalid input(s): POCBNP  CBG: Recent Labs  Lab 10/03/17 1147 10/03/17 1616 10/03/17 2053 10/04/17 0748 10/04/17 1135  GLUCAP 166* 201* 326* 252* 240*    Microbiology: Results for orders placed or performed during the hospital encounter of 09/30/17  MRSA PCR Screening     Status: Abnormal   Collection Time: 10/01/17  9:37 AM  Result Value Ref Range Status   MRSA by PCR POSITIVE (A) NEGATIVE Final    Comment:        [name] (FDA approved for NASAL specimens only), is one component of a comprehensive MRSA colonization surveillance program. It is not intended to diagnose MRSA infection nor to guide or monitor treatment for MRSA infections. RESULT CALLED TO, READ BACK BY AND VERIFIED WITH:  Gildardo Pounds AT 1207 10/01/17 SDR Performed at [facility], [address] [place], [place] [address]     Coagulation Studies: No results for input(s): LABPROT, INR in  the last 72 hours.  Urinalysis: No results for input(s): COLORURINE, LABSPEC, PHURINE, GLUCOSEU, HGBUR, BILIRUBINUR, KETONESUR, PROTEINUR, UROBILINOGEN, NITRITE, LEUKOCYTESUR in the last 72 hours.  Invalid input(s): APPERANCEUR    Imaging:  No results found.   Assessment & Plan: Pt is a 72 y.o. female with a PMHx of chronic  diastolic heart failure, atrial fibrillation, chronic hypercarbic respiratory failure, COPD, morbid obesity, hyperlipidemia, diabetes mellitus type 2, chronic kidney disease stage II, history of intracranial hemorrhage who was admitted to [facility] on 09/30/2017 for evaluation of shortness of breath.   1.  Acute renal failure/chronic kidney disease stage II.  Acute renal failure appears to be multifactorial.  Contributions from over diuresis as well as relative hypotension.  Blood pressure was as low as 78/56 yesterday.  Agree with discontinuation of losartan, Spironolactone, and IV diuretics.  Check renal ultrasound to make sure there is no underlying obstruction.  We will also check SPEP and UPEP.  Continue IV fluid hydration as prescribed.  No acute indication for dialysis at the moment.  2.  Anemia of chronic kidney disease.  Hemoglobin currently 9.8.  No indication for Procrit at the moment.  3.  Hyperkalemia.  Serum potassium currently 5.2.  If potassium rises we may need to consider use of Veltassa.  4.  Thanks for consultation.

## 2017-10-04 NOTE — Progress Notes (Signed)
Patient ID: Debra Saunders, female   DOB: 1945-11-06, 72 y.o.   MRN: [id number]  [facility] PROGRESS NOTE  ITALIA WOLFERT DQQ:[id number] DOB: 13-Nov-1945 DOA: 09/30/2017 PCP: [name], MD  HPI/Subjective: Patient was lightheaded yesterday afternoon.  Currently not lightheaded.  Feeling better with regards to her breathing.  She has not made much urine overnight.  Yesterday was hypotensive.  Objective: Vitals:   10/04/17 0433 10/04/17 0752  BP: 103/74 95/65  Pulse: 60 61  Resp: 18 18  Temp: 98 F (36.7 C) 97.7 F (36.5 C)  SpO2: 94% 100%    Filed Weights   10/02/17 0410 10/03/17 0433 10/04/17 0433  Weight: 102.7 kg (226 lb 8 oz) 103.3 kg (227 lb 12.8 oz) 104.2 kg (229 lb 11.2 oz)    ROS: Review of Systems  Constitutional: Negative for chills and fever.  Eyes: Negative for blurred vision.  Respiratory: Positive for shortness of breath. Negative for cough and wheezing.   Cardiovascular: Negative for chest pain.  Gastrointestinal: Negative for abdominal pain, constipation, diarrhea, nausea and vomiting.  Genitourinary: Negative for dysuria.  Musculoskeletal: Negative for joint pain.  Neurological: Negative for dizziness and headaches.   Exam: Physical Exam  Constitutional: She is oriented to person, place, and time.  HENT:  Nose: No mucosal edema.  Mouth/Throat: No oropharyngeal exudate or posterior oropharyngeal edema.  Eyes: Pupils are equal, round, and reactive to light. Conjunctivae, EOM and lids are normal.  Neck: No JVD present. Carotid bruit is not present. No edema present. No thyroid mass and no thyromegaly present.  Cardiovascular: S1 normal and S2 normal. An irregularly irregular rhythm present. Exam reveals no gallop.  No murmur heard. Pulses:      Dorsalis pedis pulses are 2+ on the right side, and 2+ on the left side.  Respiratory: No respiratory distress. She has decreased breath sounds in the right middle field, the right lower field, the  left middle field and the left lower field. She has no wheezes. She has rhonchi in the right lower field and the left lower field. She has no rales.  GI: Soft. Bowel sounds are normal. There is no tenderness.  Musculoskeletal:       Right ankle: She exhibits swelling.       Left ankle: She exhibits swelling.  Lymphadenopathy:    She has no cervical adenopathy.  Neurological: She is alert and oriented to person, place, and time. No cranial nerve deficit.  Skin: Skin is warm. No rash noted. Nails show no clubbing.  Psychiatric: She has a normal mood and affect.      Data Reviewed: Basic Metabolic Panel: Recent Labs  Lab 09/30/17 1545 09/30/17 1924 10/01/17 0448 10/02/17 0429 10/02/17 1557 10/03/17 0449 10/04/17 0552  NA 140  --  141 140  --  138 132*  K 3.3*  --  2.9* 2.8* 3.6 2.9* 5.2*  CL 104  --  103 95*  --  92* 92*  CO2 28  --  32 37*  --  36* 32  GLUCOSE 146*  --  116* 135*  --  139* 315*  BUN 28*  --  26* 31*  --  34* 52*  CREATININE 1.29*  --  1.26* 1.19*  --  1.44* 3.10*  CALCIUM 9.2  --  9.1 9.1  --  8.7* 8.3*  MG  --  1.3* 1.6*  --   --  2.2  --    Acute kidney injury CBC: Recent Labs  Lab 09/30/17  1545 10/03/17 0449  WBC 7.8 6.5  HGB 10.3* 9.8*  HCT 32.7* 30.5*  MCV 85.1 85.1  PLT 175 174   Cardiac Enzymes: Recent Labs  Lab 09/30/17 1545  TROPONINI 0.04*   BNP (last 3 results) Recent Labs    08/04/17 1552 08/16/17 0925 09/30/17 1545  BNP 175.0* 655.0* 1,151.0*     CBG: Recent Labs  Lab 10/03/17 0740 10/03/17 1147 10/03/17 1616 10/03/17 2053 10/04/17 0748  GLUCAP 141* 166* 201* 326* 252*    Recent Results (from the past 240 hour(s))  MRSA PCR Screening     Status: Abnormal   Collection Time: 10/01/17  9:37 AM  Result Value Ref Range Status   MRSA by PCR POSITIVE (A) NEGATIVE Final    Comment:        [name] (FDA approved for NASAL specimens only), is one component of a comprehensive MRSA  colonization surveillance program. It is not intended to diagnose MRSA infection nor to guide or monitor treatment for MRSA infections. RESULT CALLED TO, READ BACK BY AND VERIFIED WITH:  Gildardo Pounds AT 1207 10/01/17 SDR Performed at [facility], [address] [place],  [address]       Scheduled Meds: . albuterol  2.5 mg Nebulization Q6H  . amiodarone  400 mg Oral BID  . apixaban  2.5 mg Oral BID  . aspirin EC  81 mg Oral Daily  . budesonide (PULMICORT) nebulizer solution  0.5 mg Nebulization BID  . gabapentin  200 mg Oral TID  . insulin aspart  0-20 Units Subcutaneous TID WC  . insulin aspart  0-5 Units Subcutaneous QHS  . metoprolol  50 mg Oral Daily  . mupirocin ointment   Nasal BID  . predniSONE  40 mg Oral Q breakfast  . protein supplement shake  11 oz Oral BID BM  . rosuvastatin  10 mg Oral Daily  . sildenafil  20 mg Oral TID  . sodium chloride flush  3 mL Intravenous Q12H   Continuous Infusions: . sodium chloride    . sodium chloride 50 mL/hr at 10/04/17 0813    Assessment/Plan:  1. Acute kidney injury on CKD stage 3.  Likely over diuresis.  Patient was also hypotensive yesterday.  I will hold Lasix, Spironolactone and losartan.  Give gentle IV fluids.  Case discussed with nephrology to see the patient in consultation.   2. Relative hypotension.  Hold all medications that can cause hypotension. 3. Acute on chronic diastolic congestive heart failure.  Patient has moderate to severe mitral valve regurgitation.   Holding all medications secondary to hypotension and acute kidney injury.  Watch closely with gentle IV fluids. 4. Chronic atrial fibrillation on Eliquis and amiodarone.  Holding Toprol. 5. Chronic hypoxic hypercarbic respiratory failure.  Chronically on 4 L of oxygen. 6. Morbid obesity.  Weight loss needed 7. COPD exacerbation, continue nebulizer treatments.   continue prednisone.  Since lungs sound better this could be more COPD rather  than heart failure.  8. Hyperlipidemia unspecified on Crestor 9. Type 2 diabetes mellitus on sliding scale insulin  Code Status:     Code Status Orders  (From admission, onward)        Start     Ordered   09/30/17 1901  Full code  Continuous     09/30/17 1901    Code Status History    Date Active Date Inactive Code Status Order ID Comments User Context   08/16/2017 1614 08/28/2017 2239 Full Code [id number]  Gorden Harms, MD Inpatient   03/30/2015 2152 04/03/2015 2122 Full Code [id number]  Etta Quill, DO Inpatient   03/27/2015 2208 03/28/2015 1737 Full Code [id number]  Nicholes Mango, MD ED   01/09/2014 1418 01/10/2014 1803 Full Code [id number]  Deboraha Sprang, MD Inpatient   01/16/2013 2335 01/27/2013 2011 Full Code [id number]  Collene Gobble, MD Inpatient     Disposition Plan:  creatinine will need to improve prior to disposition.  Consultants:  Cardiology  Time spent: 28 minutes  [place]

## 2017-10-04 NOTE — Progress Notes (Signed)
Progress Note  Patient Name: Debra Saunders Date of Encounter: 10/04/2017  Primary Cardiologist:   [name], MD   Subjective   Hypotensive yesterday.  Creat has jumped.  She thinks that her breathing might be back to baseline.  No pain.   Inpatient Medications    Scheduled Meds: . albuterol  2.5 mg Nebulization Q6H  . amiodarone  400 mg Oral BID  . apixaban  2.5 mg Oral BID  . aspirin EC  81 mg Oral Daily  . budesonide (PULMICORT) nebulizer solution  0.5 mg Nebulization BID  . gabapentin  200 mg Oral TID  . insulin aspart  0-20 Units Subcutaneous TID WC  . insulin aspart  0-5 Units Subcutaneous QHS  . mupirocin ointment   Nasal BID  . predniSONE  40 mg Oral Q breakfast  . protein supplement shake  11 oz Oral BID BM  . rosuvastatin  10 mg Oral Daily  . sildenafil  20 mg Oral TID  . sodium chloride flush  3 mL Intravenous Q12H   Continuous Infusions: . sodium chloride    . sodium chloride 50 mL/hr at 10/04/17 0813   PRN Meds: sodium chloride, acetaminophen, ALPRAZolam, ondansetron (ZOFRAN) IV, ondansetron, oxyCODONE-acetaminophen, promethazine, sodium chloride flush   Vital Signs    Vitals:   10/03/17 1640 10/03/17 2015 10/04/17 0433 10/04/17 0752  BP: (!) 84/60 (!) 88/58 103/74 95/65  Pulse: (!) 59 (!) 59 60 61  Resp:  18 18 18   Temp:  98.6 F (37 C) 98 F (36.7 C) 97.7 F (36.5 C)  TempSrc:  Oral Oral   SpO2:  100% 94% 100%  Weight:   229 lb 11.2 oz (104.2 kg)   Height:        Intake/Output Summary (Last 24 hours) at 10/04/2017 1007 Last data filed at 10/04/2017 0300 Gross per 24 hour  Intake -  Output 950 ml  Net -950 ml   Filed Weights   10/02/17 0410 10/03/17 0433 10/04/17 0433  Weight: 226 lb 8 oz (102.7 kg) 227 lb 12.8 oz (103.3 kg) 229 lb 11.2 oz (104.2 kg)    Telemetry    Atrial fib with ventricular pacing- Personally Reviewed  ECG    NA - Personally Reviewed  Physical Exam   GEN: No  acute distress.   Neck: No   JVD Cardiac: Irregular RR, no murmurs, rubs, or gallops.  Respiratory: Clear   to auscultation bilaterally. GI: Soft, nontender, non-distended, normal bowel sounds  MS:  Left greater than right leg edema; No deformity. Neuro:   Nonfocal  Psych: Oriented and appropriate    Labs    Chemistry Recent Labs  Lab 10/02/17 0429 10/02/17 1557 10/03/17 0449 10/04/17 0552  NA 140  --  138 132*  K 2.8* 3.6 2.9* 5.2*  CL 95*  --  92* 92*  CO2 37*  --  36* 32  GLUCOSE 135*  --  139* 315*  BUN 31*  --  34* 52*  CREATININE 1.19*  --  1.44* 3.10*  CALCIUM 9.1  --  8.7* 8.3*  GFRNONAA 44*  --  35* 14*  GFRAA 51*  --  40* 16*  ANIONGAP 8  --  10 8     Hematology Recent Labs  Lab 09/30/17 1545 10/03/17 0449  WBC 7.8 6.5  RBC 3.84 3.58*  HGB 10.3* 9.8*  HCT 32.7* 30.5*  MCV 85.1 85.1  MCH 26.9 27.5  MCHC 31.6* 32.3  RDW 17.1* 16.5*  PLT 175 174  Cardiac Enzymes Recent Labs  Lab 09/30/17 1545  TROPONINI 0.04*   No results for input(s): TROPIPOC in the last 168 hours.   BNP Recent Labs  Lab 09/30/17 1545  BNP 1,151.0*     DDimer No results for input(s): DDIMER in the last 168 hours.   Radiology    No results found.  Cardiac Studies   ECHO:  10/01/17  Study Conclusions  - Left ventricle: The cavity size was normal. There was mild   concentric hypertrophy. Systolic function was normal. The   estimated ejection fraction was in the range of 50% to 55%. Wall   motion was normal; there were no regional wall motion   abnormalities. The study is not technically sufficient to allow   evaluation of LV diastolic function. - Mitral valve: There was moderate to severe regurgitation. - Left atrium: The atrium was moderately dilated. - Right ventricle: The cavity size was mildly dilated. Wall   thickness was normal. Systolic function was mildly reduced. - Right atrium: The atrium was moderately dilated. - Tricuspid valve: There was moderate regurgitation. - Pulmonary  arteries: Systolic pressure was severely elevated PA   peak pressure: 69 mm Hg (S).  Patient Profile     72 y.o. female with a history of nonobstructive CAD, chronic combined systolic and diastolic congestive heart failure, persistent atrial fibrilation, symptomatic bradycardia status post permanent pacemaker, frequent PVCs, chronic respiratory failure with hypoxia on home O2, COPD, hypertension, hyperlipidemia, obesity, type 2 diabetes mellitus, traumatic subarachnoid hemorrhage in the setting of Xarelto, left lower extremity DVT status post IVC filter, chronic right sided atypical chest pain, chronic left lower extremity lymphedema, prior stroke, sleep apnea, and obesity, who is being seen for the evaluation of acute on chronic combined heart failure in the setting of persistent atrial fibrillation at the request of Dr. Posey Pronto.   Assessment & Plan    ACUTE ON CHRONIC DIASTOLICY HF:    Down 6.1 liters.  Weight was up.  Holding diuretics and she is getting volume now with her AKI.  Holding spironolactone, Lasix and Losartan.    PERSISTENT ATRIAL FIB:  Continue amiodarone.  Continue Eliquis although we will need to discuss holding or switching to warfarin if her renal function stays severely reduced.   AKI:  Creat is up markedly.  Meds held as above.  Renal consulted.    SEVERE MR:   The plan is to follow up echo after we have medically optimized her volume and hopefully convert to and maintained atrial fib.     For questions or updates, please contact [place] Please consult [email or web address] for contact info under Cardiology/STEMI.   Signed, Minus Breeding, MD  10/04/2017, 10:07 AM

## 2017-10-05 DIAGNOSIS — I5043 Acute on chronic combined systolic (congestive) and diastolic (congestive) heart failure: Secondary | ICD-10-CM

## 2017-10-05 LAB — BASIC METABOLIC PANEL
Anion gap: 12 (ref 5–15)
BUN: 71 mg/dL — ABNORMAL HIGH (ref 6–20)
CO2: 28 mmol/L (ref 22–32)
Calcium: 7.9 mg/dL — ABNORMAL LOW (ref 8.9–10.3)
Chloride: 91 mmol/L — ABNORMAL LOW (ref 101–111)
Creatinine, Ser: 4.13 mg/dL — ABNORMAL HIGH (ref 0.44–1.00)
GFR calc Af Amer: 11 mL/min — ABNORMAL LOW (ref >60)
GFR calc non Af Amer: 10 mL/min — ABNORMAL LOW (ref >60)
Glucose, Bld: 236 mg/dL — ABNORMAL HIGH (ref 65–99)
Potassium: 4.7 mmol/L (ref 3.5–5.1)
Sodium: 131 mmol/L — ABNORMAL LOW (ref 135–145)

## 2017-10-05 LAB — PROTEIN / CREATININE RATIO, URINE
Creatinine, Urine: 187 mg/dL
Protein Creatinine Ratio: 1.48 mg/mg{creat} — ABNORMAL HIGH (ref 0.00–0.15)
Total Protein, Urine: 276 mg/dL

## 2017-10-05 LAB — PROTIME-INR
INR: 1.46
Prothrombin Time: 17.6 seconds — ABNORMAL HIGH (ref 11.4–15.2)

## 2017-10-05 LAB — C4 COMPLEMENT: Complement C4, Body Fluid: 28 mg/dL (ref 14–44)

## 2017-10-05 LAB — GLUCOSE, CAPILLARY
Glucose-Capillary: 204 mg/dL — ABNORMAL HIGH (ref 65–99)
Glucose-Capillary: 235 mg/dL — ABNORMAL HIGH (ref 65–99)
Glucose-Capillary: 322 mg/dL — ABNORMAL HIGH (ref 65–99)
Glucose-Capillary: 322 mg/dL — ABNORMAL HIGH (ref 65–99)

## 2017-10-05 LAB — HEPARIN LEVEL (UNFRACTIONATED): Heparin Unfractionated: >3.6 [IU]/mL — ABNORMAL HIGH (ref 0.30–0.70)

## 2017-10-05 LAB — C3 COMPLEMENT: C3 Complement: 183 mg/dL — ABNORMAL HIGH (ref 82–167)

## 2017-10-05 LAB — APTT: aPTT: 32 seconds (ref 24–36)

## 2017-10-05 MED ORDER — PREDNISONE 20 MG PO TABS
30.0000 mg | ORAL_TABLET | Freq: Every day | ORAL | Status: DC
  Administered 2017-10-06: 30 mg via ORAL
  Filled 2017-10-05: qty 1

## 2017-10-05 MED ORDER — HEPARIN (PORCINE) IN NACL 100-0.45 UNIT/ML-% IJ SOLN
700.0000 [IU]/h | INTRAMUSCULAR | Status: DC
  Administered 2017-10-05 – 2017-10-06 (×2): 1100 [IU]/h via INTRAVENOUS
  Administered 2017-10-08 – 2017-10-13 (×5): 950 [IU]/h via INTRAVENOUS
  Administered 2017-10-14 – 2017-10-16 (×3): 850 [IU]/h via INTRAVENOUS
  Filled 2017-10-05 (×12): qty 250

## 2017-10-05 MED ORDER — DOXYCYCLINE HYCLATE 100 MG PO TABS
100.0000 mg | ORAL_TABLET | Freq: Two times a day (BID) | ORAL | Status: DC
  Administered 2017-10-05 – 2017-10-14 (×19): 100 mg via ORAL
  Filled 2017-10-05 (×19): qty 1

## 2017-10-05 NOTE — Progress Notes (Signed)
Inpatient Diabetes Program Recommendations  AACE/ADA: New Consensus Statement on Inpatient Glycemic Control (2015)  Target Ranges:  Prepandial:   less than 140 mg/dL      Peak postprandial:   less than 180 mg/dL (1-2 hours)      Critically ill patients:  140 - 180 mg/dL   Lab Results  Component Value Date   GLUCAP 204 (H) 10/05/2017   HGBA1C 5.8 (H) 09/30/2017    Review of Glycemic ControlResults for ROSENA, BARTLE (MRN [id number]) as of 10/05/2017 11:30  Ref. Range 10/04/2017 07:48 10/04/2017 11:35 10/04/2017 16:47 10/04/2017 21:22 10/05/2017 07:55  Glucose-Capillary Latest Ref Range: 65 - 99 mg/dL 252 (H) 240 (H) 293 (H) 302 (H) 204 (H)    Diabetes history: Type 2 DM Outpatient Diabetes medications: Metformin 7989 mg bid, [name] 2.11 mg weekly Current orders for Inpatient glycemic control:  Novolog resistant tid with meals and HS, Prednisone 40 mg with breakfast  Inpatient Diabetes Program Recommendations:   Please consider reducing Novolog correction to moderate tid with meals and HS due to decreased renal function. While in the hospital and on steroids, consider adding Levemir 12 units q HS and Novolog meal coverage 3 units tid with meals (hold if pt. eats less than 50%).  Thanks,  Adah Perl, RN, BC-ADM Inpatient Diabetes Coordinator Pager [phone] (8a-5p)

## 2017-10-05 NOTE — Progress Notes (Signed)
[facility]  ROUNDING NOTE   Subjective:  Patient appears worse. Very little urine output yesterday. Creatinine up to 4.13 with a BUN of 71. No hydronephrosis on renal ultrasound.  Objective:  Vital signs in last 24 hours:  Temp:  [97.6 F (36.4 C)-98.4 F (36.9 C)] 97.6 F (36.4 C) (04/15 0759) Pulse Rate:  [60-61] 60 (04/15 0759) Resp:  [18-20] 18 (04/15 0759) BP: (85-115)/(56-70) 92/61 (04/15 0759) SpO2:  [99 %-100 %] 99 % (04/15 0759) Weight:  [107.3 kg (236 lb 8 oz)] 107.3 kg (236 lb 8 oz) (04/15 0529)  Weight change: 3.084 kg (6 lb 12.8 oz) Filed Weights   10/03/17 0433 10/04/17 0433 10/05/17 0529  Weight: 103.3 kg (227 lb 12.8 oz) 104.2 kg (229 lb 11.2 oz) 107.3 kg (236 lb 8 oz)    Intake/Output: I/O last 3 completed shifts: In: 745 [I.V.:745] Out: 120 [Urine:120]   Intake/Output this shift:  No intake/output data recorded.  Physical Exam: General: No acute distress  Head: Normocephalic, atraumatic. Moist oral mucosal membranes  Eyes: Anicteric  Neck: Supple, trachea midline  Lungs:  Clear to auscultation, normal effort  Heart: S1S2 no rubs  Abdomen:  Soft, nontender, bowel sounds present  Extremities: Trace peripheral edema.  Neurologic: Awake, alert, following commands  Skin: No lesions       Basic Metabolic Panel: Recent Labs  Lab 09/30/17 1924 10/01/17 0448 10/02/17 0429 10/02/17 1557 10/03/17 0449 10/04/17 0552 10/05/17 0748  NA  --  141 140  --  138 132* 131*  K  --  2.9* 2.8* 3.6 2.9* 5.2* 4.7  CL  --  103 95*  --  92* 92* 91*  CO2  --  32 37*  --  36* 32 28  GLUCOSE  --  116* 135*  --  139* 315* 236*  BUN  --  26* 31*  --  34* 52* 71*  CREATININE  --  1.26* 1.19*  --  1.44* 3.10* 4.13*  CALCIUM  --  9.1 9.1  --  8.7* 8.3* 7.9*  MG 1.3* 1.6*  --   --  2.2  --   --     Liver Function Tests: No results for input(s): AST, ALT, ALKPHOS, BILITOT, PROT, ALBUMIN in the last 168 hours. No results for input(s): LIPASE, AMYLASE  in the last 168 hours. No results for input(s): AMMONIA in the last 168 hours.  CBC: Recent Labs  Lab 09/30/17 1545 10/03/17 0449  WBC 7.8 6.5  HGB 10.3* 9.8*  HCT 32.7* 30.5*  MCV 85.1 85.1  PLT 175 174    Cardiac Enzymes: Recent Labs  Lab 09/30/17 1545  TROPONINI 0.04*    BNP: Invalid input(s): POCBNP  CBG: Recent Labs  Lab 10/04/17 0748 10/04/17 1135 10/04/17 1647 10/04/17 2122 10/05/17 0755  GLUCAP 252* 240* 293* 302* 204*    Microbiology: Results for orders placed or performed during the hospital encounter of 09/30/17  MRSA PCR Screening     Status: Abnormal   Collection Time: 10/01/17  9:37 AM  Result Value Ref Range Status   MRSA by PCR POSITIVE (A) NEGATIVE Final    Comment:        [name] (FDA approved for NASAL specimens only), is one component of a comprehensive MRSA colonization surveillance program. It is not intended to diagnose MRSA infection nor to guide or monitor treatment for MRSA infections. RESULT CALLED TO, READ BACK BY AND VERIFIED WITH:  Gildardo Pounds AT 1207 10/01/17 SDR Performed  at [facility], [address] [place], [place] [address]     Coagulation Studies: No results for input(s): LABPROT, INR in the last 72 hours.  Urinalysis: No results for input(s): COLORURINE, LABSPEC, PHURINE, GLUCOSEU, HGBUR, BILIRUBINUR, KETONESUR, PROTEINUR, UROBILINOGEN, NITRITE, LEUKOCYTESUR in the last 72 hours.  Invalid input(s): APPERANCEUR    Imaging: US Renal  Result Date: 10/04/2017 CLINICAL DATA:  72 year old female with acute renal failure. EXAM: RENAL / URINARY TRACT ULTRASOUND COMPLETE COMPARISON:  CT Abdomen and Pelvis 11/26/2016. FINDINGS: Right Kidney: Length: 10.9 centimeters. Right renal cortical echogenicity appears increased relative to the liver (image 3). No right hydronephrosis or renal mass. Left Kidney: Length: 10.9 centimeters. Left renal echogenicity similar to the right kidney. No left  hydronephrosis or renal mass. Bladder: Largely decompressed, appears unremarkable. IMPRESSION: 1. No acute renal findings.  Largely decompressed urinary bladder. 2. Possible chronic medical renal disease. Electronically Signed   By: Genevie Ann M.D.   On: 10/04/2017 15:29     Medications:   . sodium chloride    . sodium chloride 50 mL/hr at 10/05/17 0525   . albuterol  2.5 mg Nebulization Q6H  . amiodarone  400 mg Oral BID  . aspirin EC  81 mg Oral Daily  . budesonide (PULMICORT) nebulizer solution  0.5 mg Nebulization BID  . gabapentin  200 mg Oral TID  . insulin aspart  0-20 Units Subcutaneous TID WC  . insulin aspart  0-5 Units Subcutaneous QHS  . mupirocin ointment   Nasal BID  . predniSONE  40 mg Oral Q breakfast  . protein supplement shake  11 oz Oral BID BM  . rosuvastatin  10 mg Oral Daily  . sildenafil  20 mg Oral TID  . sodium chloride flush  3 mL Intravenous Q12H   sodium chloride, acetaminophen, ALPRAZolam, ondansetron (ZOFRAN) IV, ondansetron, oxyCODONE-acetaminophen, promethazine, sodium chloride flush  Assessment/ Plan:  72 y.o. female with a PMHx of chronic diastolic heart failure, atrial fibrillation, chronic hypercarbic respiratory failure, COPD, morbid obesity, hyperlipidemia, diabetes mellitus type 2, chronic kidney disease stage II, history of intracranial hemorrhage who was admitted to [facility] on 09/30/2017 for evaluation of shortness of breath.   1.  Acute renal failure/chronic kidney disease stage II.  Acute renal failure appears to be multifactorial.  Contributions from over diuresis as well as relative hypotension.   -Very little urine output over the preceding 24 hours.  For now continue IV fluid hydration as being performed.  We did advise the patient that if she remains oliguric we will need to consider renal replacement therapy is likely tomorrow.  She verbalized understanding of this.  Bladder scan was performed this a.m. and only showed 20 cc of urine in the  bladder.  Renal ultrasound was negative for hydronephrosis.  2.  Anemia of chronic kidney disease.    Continue to periodically monitor CBC.  3.  Hyperkalemia.  Serum potassium down to 4.7.  Continue to monitor potassium closely as she has worsening acute renal failure.     LOS: 5 Debra Saunders 4/15/20199:34 AM

## 2017-10-05 NOTE — Progress Notes (Signed)
Patient ID: Debra Saunders, female   DOB: [birth year], 72 y.o.   MRN: [id number]  [place] PROGRESS NOTE  Debra Saunders BTD:[id number] DOB: Dec 07, 1945 DOA: 09/30/2017 PCP: [name], MD  HPI/Subjective: Patient feeling a little lightheaded and dizzy.  Complains of some sinus congestion and sore throat.  Objective: Vitals:   10/05/17 0531 10/05/17 0759  BP: 100/70 92/61  Pulse: 60 60  Resp:  18  Temp:  97.6 F (36.4 C)  SpO2:  99%    Filed Weights   10/03/17 0433 10/04/17 0433 10/05/17 0529  Weight: 103.3 kg (227 lb 12.8 oz) 104.2 kg (229 lb 11.2 oz) 107.3 kg (236 lb 8 oz)    ROS: Review of Systems  Constitutional: Negative for chills and fever.  Eyes: Negative for blurred vision.  Respiratory: Positive for shortness of breath. Negative for cough and wheezing.   Cardiovascular: Negative for chest pain.  Gastrointestinal: Negative for abdominal pain, constipation, diarrhea, nausea and vomiting.  Genitourinary: Negative for dysuria.  Musculoskeletal: Negative for joint pain.  Neurological: Positive for dizziness. Negative for headaches.   Exam: Physical Exam  Constitutional: She is oriented to person, place, and time.  HENT:  Nose: No mucosal edema.  Mouth/Throat: Posterior oropharyngeal edema present. No oropharyngeal exudate.  Eyes: Pupils are equal, round, and reactive to light. Conjunctivae, EOM and lids are normal.  Neck: No JVD present. Carotid bruit is not present. No edema present. No thyroid mass and no thyromegaly present.  Cardiovascular: S1 normal and S2 normal. An irregularly irregular rhythm present. Exam reveals no gallop.  No murmur heard. Pulses:      Dorsalis pedis pulses are 2+ on the right side, and 2+ on the left side.  Respiratory: No respiratory distress. She has decreased breath sounds in the right middle field, the right lower field, the left middle field and the left lower field. She has no wheezes. She has rhonchi in the right  lower field and the left lower field. She has no rales.  GI: Soft. Bowel sounds are normal. There is no tenderness.  Musculoskeletal:       Right ankle: She exhibits swelling.       Left ankle: She exhibits swelling.  Lymphadenopathy:    She has no cervical adenopathy.  Neurological: She is alert and oriented to person, place, and time. No cranial nerve deficit.  Skin: Skin is warm. No rash noted. Nails show no clubbing.  Psychiatric: She has a normal mood and affect.      Data Reviewed: Basic Metabolic Panel: Recent Labs  Lab 09/30/17 1924 10/01/17 0448 10/02/17 0429 10/02/17 1557 10/03/17 0449 10/04/17 0552 10/05/17 0748  NA  --  141 140  --  138 132* 131*  K  --  2.9* 2.8* 3.6 2.9* 5.2* 4.7  CL  --  103 95*  --  92* 92* 91*  CO2  --  32 37*  --  36* 32 28  GLUCOSE  --  116* 135*  --  139* 315* 236*  BUN  --  26* 31*  --  34* 52* 71*  CREATININE  --  1.26* 1.19*  --  1.44* 3.10* 4.13*  CALCIUM  --  9.1 9.1  --  8.7* 8.3* 7.9*  MG 1.3* 1.6*  --   --  2.2  --   --    Acute kidney injury CBC: Recent Labs  Lab 09/30/17 1545 10/03/17 0449  WBC 7.8 6.5  HGB 10.3* 9.8*  HCT 32.7* 30.5*  MCV 85.1 85.1  PLT 175 174   Cardiac Enzymes: Recent Labs  Lab 09/30/17 1545  TROPONINI 0.04*   BNP (last 3 results) Recent Labs    08/04/17 1552 08/16/17 0925 09/30/17 1545  BNP 175.0* 655.0* 1,151.0*     CBG: Recent Labs  Lab 10/04/17 1135 10/04/17 1647 10/04/17 2122 10/05/17 0755 10/05/17 1144  GLUCAP 240* 293* 302* 204* 235*    Recent Results (from the past 240 hour(s))  MRSA PCR Screening     Status: Abnormal   Collection Time: 10/01/17  9:37 AM  Result Value Ref Range Status   MRSA by PCR POSITIVE (A) NEGATIVE Final    Comment:        [name] (FDA approved for NASAL specimens only), is one component of a comprehensive MRSA colonization surveillance program. It is not intended to diagnose MRSA infection nor to guide or monitor  treatment for MRSA infections. RESULT CALLED TO, READ BACK BY AND VERIFIED WITH:  Gildardo Pounds AT 1207 10/01/17 SDR Performed at [facility], [address] [place], [place] [address]       Scheduled Meds: . albuterol  2.5 mg Nebulization Q6H  . amiodarone  400 mg Oral BID  . aspirin EC  81 mg Oral Daily  . budesonide (PULMICORT) nebulizer solution  0.5 mg Nebulization BID  . doxycycline  100 mg Oral Q12H  . gabapentin  200 mg Oral TID  . insulin aspart  0-20 Units Subcutaneous TID WC  . insulin aspart  0-5 Units Subcutaneous QHS  . mupirocin ointment   Nasal BID  . [START ON 10/06/2017] predniSONE  30 mg Oral Q breakfast  . protein supplement shake  11 oz Oral BID BM  . rosuvastatin  10 mg Oral Daily  . sildenafil  20 mg Oral TID  . sodium chloride flush  3 mL Intravenous Q12H   Continuous Infusions: . sodium chloride    . sodium chloride 50 mL/hr at 10/05/17 0525  . heparin      Assessment/Plan:  1. Acute kidney injury on CKD stage 3.  Likely over diuresis and hypotension .  I will hold Lasix, Spironolactone and losartan.  Give gentle IV fluids.  Appreciate nephrology consultation.  If kidney function does not improve and urine output does not improve then may end up needing dialysis temporarily. 2. Relative hypotension.  Hold all medications that can cause hypotension. 3. Acute on chronic diastolic congestive heart failure.  Patient has moderate to severe mitral valve regurgitation.   Holding all medications secondary to hypotension and acute kidney injury.  Watch closely with gentle IV fluids. 4. Chronic atrial fibrillation on Eliquis and amiodarone.  Holding Toprol. 5. Chronic hypoxic hypercarbic respiratory failure.  Chronically on 4 L of oxygen. 6. Morbid obesity.  Weight loss needed 7. COPD exacerbation, continue nebulizer treatments.   taper prednisone.  Since lungs sound better this could be more COPD rather than heart failure.  8. Hyperlipidemia  unspecified on Crestor 9. Type 2 diabetes mellitus on sliding scale insulin 10. Upper respiratory tract infection start oral doxycycline 11. Hyperlipidemia unspecified on Crestor  Code Status:     Code Status Orders  (From admission, onward)        Start     Ordered   09/30/17 1901  Full code  Continuous     09/30/17 1901    Code Status History    Date Active Date Inactive Code Status Order ID Comments User Context   08/16/2017 3662  08/28/2017 2239 Full Code [id number]  Gorden Harms, MD Inpatient   03/30/2015 2152 04/03/2015 2122 Full Code [id number]  Etta Quill, DO Inpatient   03/27/2015 2208 03/28/2015 1737 Full Code [id number]  Nicholes Mango, MD ED   01/09/2014 1418 01/10/2014 1803 Full Code [id number]  Deboraha Sprang, MD Inpatient   01/16/2013 2335 01/27/2013 2011 Full Code [id number]  Collene Gobble, MD Inpatient     Disposition Plan:  creatinine will need to improve prior to disposition.  Consultants:  Cardiology  Time spent: 27 minutes  [facility]       Patient ID: Debra Saunders, female   DOB: 1945/08/10, 72 y.o.   MRN: [id number]

## 2017-10-05 NOTE — Progress Notes (Signed)
Patient has had no U/O. Bladder scan completed, highest volume noted was 51ml. Dr. Holley Raring and Dr. Leslye Peer aware. Verbal order given for foley catheter. NS infusion currently running at 43ml/hr. Will continue to assess and monitor.

## 2017-10-05 NOTE — Progress Notes (Signed)
Progress Note  Patient Name: Debra Saunders Date of Encounter: 10/05/2017  Primary Cardiologist:   [name], MD   Subjective    Renal function continues to worsen.  She has been anuric for 48 hours now.  She was hypotensive throughout the weekend.  She reports that shortness of breath has improved.  Inpatient Medications    Scheduled Meds: . albuterol  2.5 mg Nebulization Q6H  . amiodarone  400 mg Oral BID  . aspirin EC  81 mg Oral Daily  . budesonide (PULMICORT) nebulizer solution  0.5 mg Nebulization BID  . gabapentin  200 mg Oral TID  . insulin aspart  0-20 Units Subcutaneous TID WC  . insulin aspart  0-5 Units Subcutaneous QHS  . mupirocin ointment   Nasal BID  . predniSONE  40 mg Oral Q breakfast  . protein supplement shake  11 oz Oral BID BM  . rosuvastatin  10 mg Oral Daily  . sildenafil  20 mg Oral TID  . sodium chloride flush  3 mL Intravenous Q12H   Continuous Infusions: . sodium chloride    . sodium chloride 50 mL/hr at 10/05/17 0525   PRN Meds: sodium chloride, acetaminophen, ALPRAZolam, ondansetron (ZOFRAN) IV, ondansetron, oxyCODONE-acetaminophen, promethazine, sodium chloride flush   Vital Signs    Vitals:   10/04/17 2011 10/05/17 0529 10/05/17 0531 10/05/17 0759  BP: 115/67 (!) 85/70 100/70 92/61  Pulse: 60 61 60 60  Resp: 18 20  18   Temp: 98.2 F (36.8 C) 98 F (36.7 C)  97.6 F (36.4 C)  TempSrc: Oral Oral  Oral  SpO2: 99% 100%  99%  Weight:  236 lb 8 oz (107.3 kg)    Height:        Intake/Output Summary (Last 24 hours) at 10/05/2017 0909 Last data filed at 10/05/2017 0529 Gross per 24 hour  Intake 745 ml  Output 20 ml  Net 725 ml   Filed Weights   10/03/17 0433 10/04/17 0433 10/05/17 0529  Weight: 227 lb 12.8 oz (103.3 kg) 229 lb 11.2 oz (104.2 kg) 236 lb 8 oz (107.3 kg)    Telemetry    Atrial fib with ventricular pacing- Personally Reviewed  ECG    NA - Personally Reviewed  Physical Exam   GEN: No  acute  distress.   Neck: No  JVD Cardiac: Regular rate and rhythm, no murmurs, rubs, or gallops.  Respiratory: Clear   to auscultation bilaterally. GI: Soft, nontender, non-distended, normal bowel sounds  MS:  Left greater than right leg edema; No deformity. Neuro:   Nonfocal  Psych: Oriented and appropriate    Labs    Chemistry Recent Labs  Lab 10/03/17 0449 10/04/17 0552 10/05/17 0748  NA 138 132* 131*  K 2.9* 5.2* 4.7  CL 92* 92* 91*  CO2 36* 32 28  GLUCOSE 139* 315* 236*  BUN 34* 52* 71*  CREATININE 1.44* 3.10* 4.13*  CALCIUM 8.7* 8.3* 7.9*  GFRNONAA 35* 14* 10*  GFRAA 40* 16* 11*  ANIONGAP 10 8 12      Hematology Recent Labs  Lab 09/30/17 1545 10/03/17 0449  WBC 7.8 6.5  RBC 3.84 3.58*  HGB 10.3* 9.8*  HCT 32.7* 30.5*  MCV 85.1 85.1  MCH 26.9 27.5  MCHC 31.6* 32.3  RDW 17.1* 16.5*  PLT 175 174    Cardiac Enzymes Recent Labs  Lab 09/30/17 1545  TROPONINI 0.04*   No results for input(s): TROPIPOC in the last 168 hours.   BNP Recent Labs  Lab 09/30/17 1545  BNP 1,151.0*     DDimer No results for input(s): DDIMER in the last 168 hours.   Radiology    US Renal  Result Date: 10/04/2017 CLINICAL DATA:  72 year old female with acute renal failure. EXAM: RENAL / URINARY TRACT ULTRASOUND COMPLETE COMPARISON:  CT Abdomen and Pelvis 11/26/2016. FINDINGS: Right Kidney: Length: 10.9 centimeters. Right renal cortical echogenicity appears increased relative to the liver (image 3). No right hydronephrosis or renal mass. Left Kidney: Length: 10.9 centimeters. Left renal echogenicity similar to the right kidney. No left hydronephrosis or renal mass. Bladder: Largely decompressed, appears unremarkable. IMPRESSION: 1. No acute renal findings.  Largely decompressed urinary bladder. 2. Possible chronic medical renal disease. Electronically Signed   By: Genevie Ann M.D.   On: 10/04/2017 15:29    Cardiac Studies   ECHO:  10/01/17  Study Conclusions  - Left ventricle: The  cavity size was normal. There was mild   concentric hypertrophy. Systolic function was normal. The   estimated ejection fraction was in the range of 50% to 55%. Wall   motion was normal; there were no regional wall motion   abnormalities. The study is not technically sufficient to allow   evaluation of LV diastolic function. - Mitral valve: There was moderate to severe regurgitation. - Left atrium: The atrium was moderately dilated. - Right ventricle: The cavity size was mildly dilated. Wall   thickness was normal. Systolic function was mildly reduced. - Right atrium: The atrium was moderately dilated. - Tricuspid valve: There was moderate regurgitation. - Pulmonary arteries: Systolic pressure was severely elevated PA   peak pressure: 69 mm Hg (S).  Patient Profile     72 y.o. female with a history of nonobstructive CAD, chronic combined systolic and diastolic congestive heart failure, persistent atrial fibrilation, symptomatic bradycardia status post permanent pacemaker, frequent PVCs, chronic respiratory failure with hypoxia on home O2, COPD, hypertension, hyperlipidemia, obesity, type 2 diabetes mellitus, traumatic subarachnoid hemorrhage in the setting of Xarelto, left lower extremity DVT status post IVC filter, chronic right sided atypical chest pain, chronic left lower extremity lymphedema, prior stroke, sleep apnea, and obesity, who is being seen for the evaluation of acute on chronic combined heart failure in the setting of persistent atrial fibrillation at the request of Dr. Posey Pronto.   Assessment & Plan    1. Acute on chronic combined systolic and diastolic heart failure: Repeat echocardiogram during this admission showed improvement in EF to 50-55% although there was still significant pulmonary hypertension.  Unfortunately, all heart failure medications had to be stopped due to hypotension and acute renal failure which seems to be due to ATN.  Will resume once blood pressure  improves. I suspect that the patient has very narrow euvolemic window given underlying severe pulmonary hypertension and right ventricular volume dependency.  2.  Persistent atrial fibrillation: Ventricular rate has improved and currently she is mostly in ventricular paced rhythm at 60 bpm.  However, I do not see any underlying atrial activities and likely she is still in atrial fibrillation. Given acute renal failure and the fact that she is anuric, I elected to discontinue Eliquis and switch her to heparin drip until we figure out where her new baseline renal function is going to be. Currently she is on amiodarone loading. She benefits from being in sinus rhythm and I suspect that we will have to consider cardioversion at some point in the near future.  3.  Acute on chronic renal failure: Likely due to ATN.  She is currently anuric.  I discussed the case with Dr. Holley Raring.  If she does not make any urine by tomorrow, she will likely require dialysis.  All nephrotoxic medications are currently on hold including furosemide, losartan and spironolactone.  4.  Moderate to severe mitral regurgitation: This improved slightly from the time when her EF was low at continues to be significant, probably best to reevaluate this once she is in sinus rhythm.  For questions or updates, please contact [place] Please consult [email or web address] for contact info under Cardiology/STEMI.   Signed, [name], MD  10/05/2017, 9:09 AM

## 2017-10-05 NOTE — Progress Notes (Signed)
[place] for heparin Indication: atrial fibrillation  Allergies  Allergen Reactions  . Aspirin Hives and Other (See Comments)    Pt states that she is unable to take because she had bleeding in her brain.    . Other Other (See Comments)    Pt states that she is unable to take blood thinners because she had bleeding in her brain.  Blood Thinners-per doctor at [facility]  . Penicillins Hives, Shortness Of Breath, Swelling and Other (See Comments)    Has patient had a PCN reaction causing immediate rash, facial/tongue/throat swelling, SOB or lightheadedness with hypotension: Yes Has patient had a PCN reaction causing severe rash involving mucus membranes or skin necrosis: No Has patient had a PCN reaction that required hospitalization No Has patient had a PCN reaction occurring within the last 10 years: No If all of the above answers are "NO", then may proceed with Cephalosporin use.    Patient Measurements: Height: 5\' 1"  (154.9 cm) Weight: 236 lb 8 oz (107.3 kg) IBW/kg (Calculated) : 47.8 Heparin Dosing Weight: 74 kg  Vital Signs: Temp: 97.6 F (36.4 C) (04/15 0759) Temp Source: Oral (04/15 0759) BP: 92/61 (04/15 0759) Pulse Rate: 60 (04/15 0759)  Labs: Recent Labs    10/03/17 0449 10/04/17 0552 10/05/17 0748  HGB 9.8*  --   --   HCT 30.5*  --   --   PLT 174  --   --   CREATININE 1.44* 3.10* 4.13*    Estimated Creatinine Clearance: 13.5 mL/min (A) (by C-G formula based on SCr of 4.13 mg/dL (H)).   Medical History: Past Medical History:  Diagnosis Date  . Arthritis   . Atrial fibrillation, persistent ([place])    a. s/p TEE-guided DCCV on 08/20/2017  . Chest pain    a. 01/2012 Cath: nonobs dzs;  c. 04/2014 MV: No ischemia.  . Chronic back pain   . Chronic combined systolic (congestive) and diastolic (congestive) heart failure ([place])    a. Dx 2005 @ [facility];  b. 02/2014 Echo: Ef 55-60%, no rwma, mild MR, PASP 62mmHg. c. 07/2017: EF  30-35%, diffuse HK, trivial AI, severe MR, moderate TR  . Chronic respiratory failure ([facility])    a. on home O2  . COPD (chronic obstructive pulmonary disease) ([place])    a. Home O2 - 3lpm  . Gastritis   . History of intracranial hemorrhage    a. 6/14 described by neurosurgery as "small frontal posttraumatic SAH " - pt was on xarelto @ the time.  . Hyperlipidemia   . Hypertension   . Lipoma of back   . Lymphedema    a. Chronic LLE edema.  . Obesity   . Sepsis with metabolic encephalopathy ([place])   . Sleep apnea   . Streptococcal bacteremia   . Stroke ([place])   . Symptomatic bradycardia    a. s/p BSX PPM.  . Thyroid nodule   . Type II diabetes mellitus ([place])   . Urinary incontinence     Medications:  Patient takes apixaban 5mg  BID at home for afib. She was initially started on apixaban 5mg  BID when she was admitted, but her dose was changed to apixaban 2.5mg  BID on 4/14 due to her increase in Scr (1.4 --> 3.1). Cardiology stated that decrease in apixaban dose was for instability of patient's renal function.   Assessment: Patient is a 72 yo female admitted with exacerbation of CHF in the setting of persistent afib.  As above, patient  was on apixaban 2.5mg  BID due to unstable renal function. Per cardiology, as renal function continues to decline (Scr 4.13 on 4/15) wish to transition patient from apixaban to heparin. Pharmacy has been consulted to dose and manage heparin. Per cardiology, will start heparin 8 hours after last dose of apixaban.   Goal of Therapy:  Heparin level 0.3-0.7 units/ml  APTT 66-102s Monitor platelets by anticoagulation protocol: Yes   Plan:  Baseline labs collected at 0921 and last apixaban dose at 0800. HL > 3.6, APTT 32. Will continue to monitor until HL and APTT's correlate.  Start heparin infusion at 1100 units/hr  Will start heparin 8 hours after last dose of apixaban (0800). Will not give bolus. Will obtain follow up heparin level in 8 hours. Pharmacy will  continue to monitor and adjust.   Lendon Ka, PharmD Pharmacy Resident 10/05/2017,9:35 AM

## 2017-10-06 ENCOUNTER — Telehealth: Payer: Self-pay | Admitting: Family Medicine

## 2017-10-06 ENCOUNTER — Encounter
Admission: EM | Disposition: A | Discharge status: Skilled Nursing Facility | Payer: Self-pay | Source: Home / Self Care | Attending: Internal Medicine

## 2017-10-06 DIAGNOSIS — N189 Chronic kidney disease, unspecified: Secondary | ICD-10-CM

## 2017-10-06 DIAGNOSIS — I481 Persistent atrial fibrillation: Secondary | ICD-10-CM

## 2017-10-06 DIAGNOSIS — I2510 Atherosclerotic heart disease of native coronary artery without angina pectoris: Secondary | ICD-10-CM

## 2017-10-06 DIAGNOSIS — N183 Chronic kidney disease, stage 3 (moderate): Secondary | ICD-10-CM

## 2017-10-06 DIAGNOSIS — N179 Acute kidney failure, unspecified: Secondary | ICD-10-CM

## 2017-10-06 DIAGNOSIS — R0602 Shortness of breath: Secondary | ICD-10-CM

## 2017-10-06 DIAGNOSIS — I4891 Unspecified atrial fibrillation: Secondary | ICD-10-CM

## 2017-10-06 HISTORY — PX: DIALYSIS/PERMA CATHETER INSERTION: CATH118288

## 2017-10-06 LAB — BASIC METABOLIC PANEL
Anion gap: 11 (ref 5–15)
BUN: 83 mg/dL — ABNORMAL HIGH (ref 6–20)
CO2: 26 mmol/L (ref 22–32)
Calcium: 7.8 mg/dL — ABNORMAL LOW (ref 8.9–10.3)
Chloride: 88 mmol/L — ABNORMAL LOW (ref 101–111)
Creatinine, Ser: 4.65 mg/dL — ABNORMAL HIGH (ref 0.44–1.00)
GFR calc Af Amer: 10 mL/min — ABNORMAL LOW (ref >60)
GFR calc non Af Amer: 8 mL/min — ABNORMAL LOW (ref >60)
Glucose, Bld: 381 mg/dL — ABNORMAL HIGH (ref 65–99)
Potassium: 5.6 mmol/L — ABNORMAL HIGH (ref 3.5–5.1)
Sodium: 125 mmol/L — ABNORMAL LOW (ref 135–145)

## 2017-10-06 LAB — PROTEIN ELECTROPHORESIS, SERUM
A/G Ratio: 0.9 (ref 0.7–1.7)
Albumin ELP: 3.2 g/dL (ref 2.9–4.4)
Alpha-1-Globulin: 0.3 g/dL (ref 0.0–0.4)
Alpha-2-Globulin: 0.9 g/dL (ref 0.4–1.0)
Beta Globulin: 1.1 g/dL (ref 0.7–1.3)
Gamma Globulin: 1.3 g/dL (ref 0.4–1.8)
Globulin, Total: 3.6 g/dL (ref 2.2–3.9)
Total Protein ELP: 6.8 g/dL (ref 6.0–8.5)

## 2017-10-06 LAB — HEPARIN LEVEL (UNFRACTIONATED): Heparin Unfractionated: 2.54 IU/mL — ABNORMAL HIGH (ref 0.30–0.70)

## 2017-10-06 LAB — GLUCOSE, CAPILLARY
Glucose-Capillary: 289 mg/dL — ABNORMAL HIGH (ref 65–99)
Glucose-Capillary: 225 mg/dL — ABNORMAL HIGH (ref 65–99)
Glucose-Capillary: 278 mg/dL — ABNORMAL HIGH (ref 65–99)
Glucose-Capillary: 264 mg/dL — ABNORMAL HIGH (ref 65–99)

## 2017-10-06 LAB — PROTEIN ELECTRO, RANDOM URINE
Albumin ELP, Urine: 57.1 %
Alpha-1-Globulin, U: 3.9 %
Alpha-2-Globulin, U: 8.6 %
Beta Globulin, U: 9.8 %
Gamma Globulin, U: 20.6 %
Total Protein, Urine: 291.3 mg/dL

## 2017-10-06 LAB — PHOSPHORUS: Phosphorus: 5.5 mg/dL — ABNORMAL HIGH (ref 2.5–4.6)

## 2017-10-06 LAB — HEMOGLOBIN: Hemoglobin: 9.7 g/dL — ABNORMAL LOW (ref 12.0–16.0)

## 2017-10-06 LAB — APTT: aPTT: 68 seconds — ABNORMAL HIGH (ref 24–36)

## 2017-10-06 SURGERY — DIALYSIS/PERMA CATHETER INSERTION
Anesthesia: LOCAL

## 2017-10-06 MED ORDER — PREDNISONE 20 MG PO TABS: 20.0000 mg | ORAL_TABLET | Freq: Every day | ORAL | Status: DC

## 2017-10-06 MED ORDER — PREDNISONE 10 MG PO TABS
10.0000 mg | ORAL_TABLET | Freq: Every day | ORAL | Status: DC
  Administered 2017-10-07 – 2017-10-08 (×2): 10 mg via ORAL
  Filled 2017-10-06: qty 1

## 2017-10-06 MED ORDER — MENTHOL 3 MG MT LOZG
1.0000 | LOZENGE | OROMUCOSAL | Status: DC | PRN
  Administered 2017-10-06 – 2017-10-12 (×2): 3 mg via ORAL
  Filled 2017-10-06: qty 9

## 2017-10-06 MED ORDER — LIDOCAINE HCL (PF) 1 % IJ SOLN
INTRAMUSCULAR | Status: DC | PRN
  Administered 2017-10-06: 15 mL via INTRADERMAL

## 2017-10-06 SURGICAL SUPPLY — 3 items
CANNULA 5F STIFF (CANNULA) ×2 IMPLANT
GUIDEWIRE AMPLATZ SHORT (WIRE) ×2 IMPLANT
KIT DIALYSIS CATH TRI 30X13 (CATHETERS) ×2 IMPLANT

## 2017-10-06 NOTE — Progress Notes (Signed)
HD initiated via new R Fem cath without issue. Patient currently without complaints. Mildly short of breath with exertion. Sats currently 98% on 3L [place]. No complaints of pain. Hemodialysis procedure discussed with patient and all questions answered. Continue to monitor.

## 2017-10-06 NOTE — Op Note (Signed)
  OPERATIVE NOTE   PROCEDURE: 1. Insertion of temporary dialysis catheter catheter right femoral  approach.  PRE-OPERATIVE DIAGNOSIS: Acute on chronic renal failure  POST-OPERATIVE DIAGNOSIS: same  SURGEON: Katha Cabal M.D.  ANESTHESIA: 1% lidocaine local infiltration  ESTIMATED BLOOD LOSS: Minimal cc  INDICATIONS:   Debra Saunders is a 72 y.o. female who presents with acute on chronic renal faiure.  DESCRIPTION: After obtaining full informed written consent, the patient was positioned supine. The right groin was prepped and draped in a sterile fashion. Ultrasound was placed in a sterile sleeve. Ultrasound was utilized to identify the right common femoral vein which is noted to be echolucent and compressible indicating patency. Images recorded for the permanent record. Under real-time visualization a Seldinger needle is inserted into the vein and the guidewires advanced without difficulty. Small counterincision was made at the wire insertion site. Dilator is passed over the wire and the temporary dialysis catheter catheter is fed over the wire without difficulty.  All lumens aspirate and flush easily and are packed with heparin saline. Catheter secured to the skin of the right thigh with 2-0 silk. A sterile dressing is applied with Biopatch.  COMPLICATIONS: None  CONDITION: Unchanged  [name] Office:  [phone] 10/06/2017, 9:56 PM

## 2017-10-06 NOTE — Consult Note (Signed)
[facility] Vascular Consult Note  MRN : [id number]  Debra Saunders is a 72 y.o. (06/22/1946) female who presents with chief complaint of  Chief Complaint  Patient presents with  . Shortness of Breath  .  History of Present Illness:  I am asked to see the patient by Dr Holley Raring for worsening renal function.   The patient is a 72 y.o. female with a PMHx of chronic diastolic heart failure, atrial fibrillation, chronic hypercarbic respiratory failure, COPD, morbid obesity, hyperlipidemia, diabetes mellitus type 2, chronic kidney disease stage II.  She was admitted to [facility] on 09/30/2017 for evaluation of shortness of breath and chronic diastolic heart failure exacerbation.  She was started on IV diuretics and was also on spironolactone in addition to losartan.   She has been started on IV fluid hydration with 0.9 normal saline at 50 cc/h.  Overall her shortness of breath has improved as compared to admission. However, she has now progressed with her renal failur.  She currently denies nausea, vomiting, or dyscrasia.  She does note that she had an episode of renal failure in the past but recovered and never needed dialysis    Current Facility-Administered Medications  Medication Dose Route Frequency Provider Last Rate Last Dose  . 0.9 %  sodium chloride infusion  250 mL Intravenous PRN Salary, Montell D, MD      . 0.9 %  sodium chloride infusion   Intravenous Continuous Loletha Grayer, MD 50 mL/hr at 10/06/17 7322    . acetaminophen (TYLENOL) tablet 650 mg  650 mg Oral Q4H PRN Salary, Holly Bodily D, MD   650 mg at 10/01/17 1758  . albuterol (PROVENTIL) (2.5 MG/3ML) 0.083% nebulizer solution 2.5 mg  2.5 mg Nebulization Q6H Dustin Flock, MD   2.5 mg at 10/06/17 2009  . ALPRAZolam (XANAX) tablet 0.25 mg  0.25 mg Oral BID PRN Salary, Montell D, MD      . amiodarone (PACERONE) tablet 400 mg  400 mg Oral BID Theora Gianotti, NP   400 mg at 10/06/17 2116  . aspirin  EC tablet 81 mg  81 mg Oral Daily Salary, Montell D, MD      . budesonide (PULMICORT) nebulizer solution 0.5 mg  0.5 mg Nebulization BID Loletha Grayer, MD   0.5 mg at 10/06/17 2009  . doxycycline (VIBRA-TABS) tablet 100 mg  100 mg Oral Q12H Loletha Grayer, MD   100 mg at 10/06/17 2116  . gabapentin (NEURONTIN) capsule 200 mg  200 mg Oral TID Salary, Montell D, MD   200 mg at 10/06/17 2117  . heparin ADULT infusion 100 units/mL (25000 units/239mL sodium chloride 0.45%)  1,100 Units/hr Intravenous Continuous Lifsey, Betti Cruz, RPH 11 mL/hr at 10/06/17 1216 1,100 Units/hr at 10/06/17 1216  . insulin aspart (novoLOG) injection 0-20 Units  0-20 Units Subcutaneous TID WC Gorden Harms, MD   11 Units at 10/06/17 1744  . insulin aspart (novoLOG) injection 0-5 Units  0-5 Units Subcutaneous QHS Loney Hering D, MD   3 Units at 10/06/17 2117  . menthol-cetylpyridinium (CEPACOL) lozenge 3 mg  1 lozenge Oral PRN Loletha Grayer, MD   3 mg at 10/06/17 1745  . mupirocin ointment (BACTROBAN) 2 %   Nasal BID Dustin Flock, MD   1 application at 02/54/27 [phone]  . ondansetron (ZOFRAN) injection 4 mg  4 mg Intravenous Q6H PRN Salary, Montell D, MD   4 mg at 10/03/17 1213  . ondansetron (ZOFRAN) tablet 8 mg  8 mg  Oral Q8H PRN Salary, Montell D, MD      . oxyCODONE-acetaminophen (PERCOCET/ROXICET) 5-325 MG per tablet 1 tablet  1 tablet Oral Q8H PRN Salary, Montell D, MD      . Derrill Memo ON 10/07/2017] predniSONE (DELTASONE) tablet 10 mg  10 mg Oral Q breakfast Wieting, Richard, MD      . promethazine (PHENERGAN) tablet 25 mg  25 mg Oral Q4H PRN Salary, Montell D, MD      . protein supplement (PREMIER PROTEIN) liquid  11 oz Oral BID BM Salary, Montell D, MD   11 oz at 10/06/17 1331  . rosuvastatin (CRESTOR) tablet 10 mg  10 mg Oral Daily Salary, Montell D, MD   10 mg at 10/06/17 4818  . sildenafil (REVATIO) tablet 20 mg  20 mg Oral TID Loney Hering D, MD   20 mg at 10/06/17 2117  . sodium chloride flush (NS)  0.9 % injection 3 mL  3 mL Intravenous Q12H Salary, Montell D, MD   3 mL at 10/06/17 0835  . sodium chloride flush (NS) 0.9 % injection 3 mL  3 mL Intravenous PRN Salary, Avel Peace, MD        Past Medical History:  Diagnosis Date  . Arthritis   . Atrial fibrillation, persistent ([place])    a. s/p TEE-guided DCCV on 08/20/2017  . Chest pain    a. 01/2012 Cath: nonobs dzs;  c. 04/2014 MV: No ischemia.  . Chronic back pain   . Chronic combined systolic (congestive) and diastolic (congestive) heart failure ([place])    a. Dx 2005 @ [facility];  b. 02/2014 Echo: Ef 55-60%, no rwma, mild MR, PASP 37mmHg. c. 07/2017: EF 30-35%, diffuse HK, trivial AI, severe MR, moderate TR  . Chronic respiratory failure (H[facility])    a. on home O2  . COPD (chronic obstructive pulmonary disease) ([place])    a. Home O2 - 3lpm  . Gastritis   . History of intracranial hemorrhage    a. 6/14 described by neurosurgery as "small frontal posttraumatic SAH " - pt was on xarelto @ the time.  . Hyperlipidemia   . Hypertension   . Lipoma of back   . Lymphedema    a. Chronic LLE edema.  . Obesity   . Sepsis with metabolic encephalopathy ([place])   . Sleep apnea   . Streptococcal bacteremia   . Stroke ([place])   . Symptomatic bradycardia    a. s/p BSX PPM.  . Thyroid nodule   . Type II diabetes mellitus ([place])   . Urinary incontinence     Past Surgical History:  Procedure Laterality Date  . ABDOMINAL HYSTERECTOMY  1969  . CARDIAC CATHETERIZATION  2013   @ [place]: No obstructive CAD: Only 20% ostial left circumflex.  . CHOLECYSTECTOMY    . CYST REMOVAL TRUNK  2002   BACK  . INSERT / REPLACE / REMOVE PACEMAKER    . lipoma removal  2014   back  . PACEMAKER INSERTION  [address] Scientific dual chamber pacemaker implanted by Dr Caryl Comes for symptomatic bradycardia  . PERMANENT PACEMAKER INSERTION N/A 01/09/2014   Procedure: PERMANENT PACEMAKER INSERTION;  Surgeon: Deboraha Sprang, MD;  Location: [facility] CATH LAB;  Service: Cardiovascular;   Laterality: N/A;  . TEE WITHOUT CARDIOVERSION N/A 08/20/2017   Procedure: TRANSESOPHAGEAL ECHOCARDIOGRAM (TEE) & Direct current cardioversion;  Surgeon: Minna Merritts, MD;  Location: [facility] ORS;  Service: Cardiovascular;  Laterality: N/A;  . TRIGGER FINGER RELEASE    . VAGINAL  HYSTERECTOMY      Social History Social History   Tobacco Use  . Smoking status: Former Smoker    Packs/day: 0.30    Years: 2.00    Pack years: 0.60    Types: Cigarettes    Last attempt to quit: 06/23/1990    Years since quitting: 27.3  . Smokeless tobacco: Never Used  Substance Use Topics  . Alcohol use: No  . Drug use: No    Family History Family History  Problem Relation Age of Onset  . Heart disease Mother   . Hypertension Mother   . COPD Mother        was a smoker  . Thyroid disease Mother   . Hypertension Father   . Hypertension Sister   . Thyroid disease Sister   . Hypertension Sister   . Thyroid disease Sister   . Breast cancer Maternal Aunt   . Kidney disease Neg Hx   . Bladder Cancer Neg Hx   No family history of bleeding/clotting disorders, porphyria or autoimmune disease   Allergies  Allergen Reactions  . Aspirin Hives and Other (See Comments)    Pt states that she is unable to take because she had bleeding in her brain.    . Other Other (See Comments)    Pt states that she is unable to take blood thinners because she had bleeding in her brain.  Blood Thinners-per doctor at [facility]  . Penicillins Hives, Shortness Of Breath, Swelling and Other (See Comments)    Has patient had a PCN reaction causing immediate rash, facial/tongue/throat swelling, SOB or lightheadedness with hypotension: Yes Has patient had a PCN reaction causing severe rash involving mucus membranes or skin necrosis: No Has patient had a PCN reaction that required hospitalization No Has patient had a PCN reaction occurring within the last 10 years: No If all of the above answers are "NO", then may proceed with  Cephalosporin use.     REVIEW OF SYSTEMS (Negative unless checked)  Constitutional: [] Weight loss  [] Fever  [] Chills Cardiac: [] Chest pain   [] Chest pressure   [] Palpitations   [] Shortness of breath when laying flat   [x] Shortness of breath at rest   [x] Shortness of breath with exertion. Vascular:  [] Pain in legs with walking   [] Pain in legs at rest   [] Pain in legs when laying flat   [] Claudication   [] Pain in feet when walking  [] Pain in feet at rest  [] Pain in feet when laying flat   [] History of DVT   [] Phlebitis   [x] Swelling in legs   [] Varicose veins   [] Non-healing ulcers Pulmonary:   [] Uses home oxygen   [] Productive cough   [] Hemoptysis   [] Wheeze  [] COPD   [] Asthma Neurologic:  [] Dizziness  [] Blackouts   [] Seizures   [] History of stroke   [] History of TIA  [] Aphasia   [] Temporary blindness   [] Dysphagia   [] Weakness or numbness in arms   [] Weakness or numbness in legs Musculoskeletal:  [] Arthritis   [] Joint swelling   [] Joint pain   [] Low back pain Hematologic:  [] Easy bruising  [] Easy bleeding   [] Hypercoagulable state   [] Anemic  [] Hepatitis Gastrointestinal:  [] Blood in stool   [] Vomiting blood  [] Gastroesophageal reflux/heartburn   [] Difficulty swallowing. Genitourinary:  [x] Chronic kidney disease   [] Difficult urination  [] Frequent urination  [] Burning with urination   [] Blood in urine Skin:  [] Rashes   [] Ulcers   [] Wounds Psychological:  [] History of anxiety   []  History of major depression.  Physical Examination  Vitals:   10/06/17 1717 10/06/17 1737 10/06/17 2011 10/06/17 2015  BP: 114/66 (!) 100/54  133/73  Pulse: 60 (!) 59  60  Resp: 18 18  17   Temp:  98.6 F (37 C)  98.1 F (36.7 C)  TempSrc:  Oral  Oral  SpO2:  100% 99% 100%  Weight:      Height:       Body mass index is 45.82 kg/m. Gen:  WD/WN, NAD Head: [place]/AT, No temporalis wasting. Prominent temp pulse not noted. Ear/Nose/Throat: Hearing grossly intact, nares w/o erythema or drainage, oropharynx w/o  Erythema/Exudate Eyes: Sclera non-icteric, conjunctiva clear Neck: Trachea midline.  No JVD.  Pulmonary:  Good air movement, respirations not labored, equal bilaterally.  Cardiac: RRR, normal S1, S2. Vascular:  Vessel Right Left  Femoral Palpable Palpable  Gastrointestinal: soft, non-tender/non-distended. No guarding/reflex.  Musculoskeletal: M/S 5/5 throughout.  Extremities without ischemic changes.  No deformity or atrophy. No edema. Neurologic: Sensation grossly intact in extremities.  Symmetrical.  Speech is fluent. Motor exam as listed above. Psychiatric: Judgment intact, Mood & affect appropriate for pt's clinical situation. Dermatologic: No rashes or ulcers noted.  No cellulitis or open wounds. Lymph : No Cervical, Axillary, or Inguinal lymphadenopathy.      CBC Lab Results  Component Value Date   WBC 6.5 10/03/2017   HGB 9.7 (L) 10/06/2017   HCT 30.5 (L) 10/03/2017   MCV 85.1 10/03/2017   PLT 174 10/03/2017    BMET    Component Value Date/Time   NA 125 (L) 10/06/2017 0020   NA 143 09/14/2017 1427   NA 135 (L) 12/28/2013 1300   K 5.6 (H) 10/06/2017 0020   K 3.2 (L) 12/28/2013 1300   CL 88 (L) 10/06/2017 0020   CL 96 (L) 12/28/2013 1300   CO2 26 10/06/2017 0020   CO2 33 (H) 12/28/2013 1300   GLUCOSE 381 (H) 10/06/2017 0020   GLUCOSE 170 (H) 12/28/2013 1300   BUN 83 (H) 10/06/2017 0020   BUN 26 09/14/2017 1427   BUN 12 12/28/2013 1300   CREATININE 4.65 (H) 10/06/2017 0020   CREATININE 0.86 06/15/2017 1205   CALCIUM 7.8 (L) 10/06/2017 0020   CALCIUM 9.0 12/28/2013 1300   GFRNONAA 8 (L) 10/06/2017 0020   GFRNONAA >60 12/28/2013 1300   GFRAA 10 (L) 10/06/2017 0020   GFRAA >60 12/28/2013 1300   Estimated Creatinine Clearance: 12.2 mL/min (A) (by C-G formula based on SCr of 4.65 mg/dL (H)).  COAG Lab Results  Component Value Date   INR 1.46 10/05/2017   INR 1.0 12/28/2013   INR 1.34 01/17/2013    Radiology Dg Chest 2 View  Result Date:  09/30/2017 CLINICAL DATA:  Shortness of breath and weight gain of 17 pounds over the past several weeks. Suspect CHF exacerbation. History of atrial fibrillation, CHF, COPD, former smoker. EXAM: CHEST - 2 VIEW COMPARISON:  Portable chest x-ray of August 25, 2017 FINDINGS: The right hemidiaphragm remains higher than the left. The right lung is hypoinflated. The left lung is better inflated. The interstitial markings are increased bilaterally with patchy areas of airspace opacity. The cardiac silhouette is enlarged. The pulmonary vascularity is engorged and indistinct. There are calcifications in the wall of the aortic arch. The ICD is in stable position. There is no significant pleural effusion. IMPRESSION: Increased interstitial markings bilaterally consistent with interstitial and early alveolar edema. Certainly pneumonia is not entirely excluded. No significant pleural effusion. Thoracic aortic atherosclerosis. Electronically Signed   By:  David  Martinique M.D.   On: 09/30/2017 16:14   US Renal  Result Date: 10/04/2017 CLINICAL DATA:  72 year old female with acute renal failure. EXAM: RENAL / URINARY TRACT ULTRASOUND COMPLETE COMPARISON:  CT Abdomen and Pelvis 11/26/2016. FINDINGS: Right Kidney: Length: 10.9 centimeters. Right renal cortical echogenicity appears increased relative to the liver (image 3). No right hydronephrosis or renal mass. Left Kidney: Length: 10.9 centimeters. Left renal echogenicity similar to the right kidney. No left hydronephrosis or renal mass. Bladder: Largely decompressed, appears unremarkable. IMPRESSION: 1. No acute renal findings.  Largely decompressed urinary bladder. 2. Possible chronic medical renal disease. Electronically Signed   By: Genevie Ann M.D.   On: 10/04/2017 15:29      Assessment/Plan 1 Acute on chronic renal faiure She now requires dialysis urgently and will need a temproray cath placed.  Subsequently dialysis will be initiated.   2 chronic A. Fib Stable Continue  Eliquis, Toprol-XL  3 chronic hypoxic hypercarbic respiratory failure Stable Continue O2 via nasal cannula-on 4 L  which is her baseline  4 chronic obstructive sleep apnea Stable CPAP at bedtime/as needed  5 chronic diabetes mellitus type 2 Sliding scale insulin with Accu-Cheks per routine, low-sodium/cardiac/carbohydrate consistent diet with fluid restriction, check hemoglobin A1c to determine level control    [name], MD  10/06/2017 9:38 PM    This note was created with Dragon medical transcription system.  Any error is purely unintentional

## 2017-10-06 NOTE — Telephone Encounter (Signed)
FYI

## 2017-10-06 NOTE — Progress Notes (Signed)
MD Earleen Newport was notified of blood in stool.No further orders at this time.

## 2017-10-06 NOTE — Progress Notes (Signed)
Post hd assessment unchanged  

## 2017-10-06 NOTE — Progress Notes (Signed)
Pre hd 

## 2017-10-06 NOTE — Progress Notes (Signed)
Patient ID: Debra Saunders, female   DOB: 15-Jul-1945, 72 y.o.   MRN: [id number]  [facility] PROGRESS NOTE  Debra Saunders [id number]:[id number] DOB: 01/19/1946 DOA: 09/30/2017 PCP: Debra Haven, MD  HPI/Subjective: Patient had a little jelly type blood on hard bowel movement this morning.  She states that she had a virtual colonoscopy as outpatient that showed something but they wanted to follow it.  Unable to obtain those results in the computer.  Will watch for any further bleeding.  Cardiology would like to keep on the heparin at this point.  Some shortness of breath  Objective: Vitals:   10/06/17 0758 10/06/17 1130  BP: 99/62   Pulse: (!) 59 68  Resp: 18   Temp: 97.6 F (36.4 C)   SpO2: 100% 96%    Filed Weights   10/04/17 0433 10/05/17 0529 10/06/17 0419  Weight: 104.2 kg (229 lb 11.2 oz) 107.3 kg (236 lb 8 oz) 110 kg (242 lb 8.1 oz)    ROS: Review of Systems  Constitutional: Negative for chills and fever.  Eyes: Negative for blurred vision.  Respiratory: Positive for shortness of breath. Negative for cough and wheezing.   Cardiovascular: Negative for chest pain.  Gastrointestinal: Negative for abdominal pain, constipation, diarrhea, nausea and vomiting.  Genitourinary: Negative for dysuria.  Musculoskeletal: Negative for joint pain.  Neurological: Negative for dizziness and headaches.   Exam: Physical Exam  Constitutional: She is oriented to person, place, and time.  HENT:  Nose: No mucosal edema.  Mouth/Throat: Posterior oropharyngeal edema present. No oropharyngeal exudate.  Eyes: Pupils are equal, round, and reactive to light. Conjunctivae, EOM and lids are normal.  Neck: No JVD present. Carotid bruit is not present. No edema present. No thyroid mass and no thyromegaly present.  Cardiovascular: S1 normal and S2 normal. An irregularly irregular rhythm present. Exam reveals no gallop.  No murmur heard. Pulses:      Dorsalis pedis pulses are 2+ on the right  side, and 2+ on the left side.  Respiratory: No respiratory distress. She has decreased breath sounds in the right lower field and the left lower field. She has no wheezes. She has no rhonchi. She has no rales.  GI: Soft. Bowel sounds are normal. There is no tenderness.  Musculoskeletal:       Right ankle: She exhibits swelling.       Left ankle: She exhibits swelling.  Lymphadenopathy:    She has no cervical adenopathy.  Neurological: She is alert and oriented to person, place, and time. No cranial nerve deficit.  Skin: Skin is warm. No rash noted. Nails show no clubbing.  Psychiatric: She has a normal mood and affect.      Data Reviewed: Basic Metabolic Panel: Recent Labs  Lab 09/30/17 1924 10/01/17 0448 10/02/17 0429 10/02/17 1557 10/03/17 0449 10/04/17 0552 10/05/17 0748 10/06/17 0020 10/06/17 1012  NA  --  141 140  --  138 132* 131* 125*  --   K  --  2.9* 2.8* 3.6 2.9* 5.2* 4.7 5.6*  --   CL  --  103 95*  --  92* 92* 91* 88*  --   CO2  --  32 37*  --  36* 32 28 26  --   GLUCOSE  --  116* 135*  --  139* 315* 236* 381*  --   BUN  --  26* 31*  --  34* 52* 71* 83*  --   CREATININE  --  1.26* 1.19*  --  1.44* 3.10* 4.13* 4.65*  --   CALCIUM  --  9.1 9.1  --  8.7* 8.3* 7.9* 7.8*  --   MG 1.3* 1.6*  --   --  2.2  --   --   --   --   PHOS  --   --   --   --   --   --   --   --  5.5*   Acute kidney injury CBC: Recent Labs  Lab 09/30/17 1545 10/03/17 0449 10/06/17 1012  WBC 7.8 6.5  --   HGB 10.3* 9.8* 9.7*  HCT 32.7* 30.5*  --   MCV 85.1 85.1  --   PLT 175 174  --    Cardiac Enzymes: Recent Labs  Lab 09/30/17 1545  TROPONINI 0.04*   BNP (last 3 results) Recent Labs    08/04/17 1552 08/16/17 0925 09/30/17 1545  BNP 175.0* 655.0* 1,151.0*     CBG: Recent Labs  Lab 10/05/17 1144 10/05/17 1711 10/05/17 2125 10/06/17 0753 10/06/17 1125  GLUCAP 235* 322* 322* 289* 225*    Recent Results (from the past 240 hour(s))  MRSA PCR Screening     Status:  Abnormal   Collection Time: 10/01/17  9:37 AM  Result Value Ref Range Status   MRSA by PCR POSITIVE (A) NEGATIVE Final    Comment:        [name] (FDA approved for NASAL specimens only), is one component of a comprehensive MRSA colonization surveillance program. It is not intended to diagnose MRSA infection nor to guide or monitor treatment for MRSA infections. RESULT CALLED TO, READ BACK BY AND VERIFIED WITH:  Gildardo Pounds AT 1207 10/01/17 SDR Performed at [facility], [address] [place], [place] [address]       Scheduled Meds: . albuterol  2.5 mg Nebulization Q6H  . amiodarone  400 mg Oral BID  . aspirin EC  81 mg Oral Daily  . budesonide (PULMICORT) nebulizer solution  0.5 mg Nebulization BID  . doxycycline  100 mg Oral Q12H  . gabapentin  200 mg Oral TID  . insulin aspart  0-20 Units Subcutaneous TID WC  . insulin aspart  0-5 Units Subcutaneous QHS  . mupirocin ointment   Nasal BID  . [START ON 10/07/2017] predniSONE  10 mg Oral Q breakfast  . protein supplement shake  11 oz Oral BID BM  . rosuvastatin  10 mg Oral Daily  . sildenafil  20 mg Oral TID  . sodium chloride flush  3 mL Intravenous Q12H   Continuous Infusions: . sodium chloride    . sodium chloride 50 mL/hr at 10/06/17 0213  . heparin 1,100 Units/hr (10/06/17 1216)    Assessment/Plan:  1. Acute kidney injury on CKD stage 3.  Likely over diuresis and hypotension .  I will hold Lasix, Spironolactone and losartan.  Nephrology  to start temporary dialysis. 2. Relative hypotension.  Hold all medications that can cause hypotension. 3. Acute on chronic diastolic congestive heart failure.  Patient has moderate to severe mitral valve regurgitation.   Holding all medications secondary to hypotension and acute kidney injury.  Watch closely with gentle IV fluids. 4. Chronic atrial fibrillation on heparin drip and amiodarone.  Holding Toprol. 5. Chronic hypoxic hypercarbic  respiratory failure.  Chronically on 4 L of oxygen. 6. Morbid obesity.  Weight loss needed 7. COPD exacerbation, continue nebulizer treatments.   taper prednisone.  Since lungs sound better this could be more COPD rather  than heart failure.  8. Hyperlipidemia unspecified on Crestor 9. Type 2 diabetes mellitus on sliding scale insulin 10. Upper respiratory tract infection start oral doxycycline 11. Hyperlipidemia unspecified on Crestor 12. Small amount of blood with bowel movement this morning.  Continue to watch closely while on anticoagulation.  Code Status:     Code Status Orders  (From admission, onward)        Start     Ordered   09/30/17 1901  Full code  Continuous     09/30/17 1901    Code Status History    Date Active Date Inactive Code Status Order ID Comments User Context   08/16/2017 [id number] 08/28/2017 2239 Full Code [id number]  Gorden Harms, MD Inpatient   03/30/2015 2152 04/03/2015 2122 Full Code [id number]  Etta Quill, DO Inpatient   03/27/2015 2208 03/28/2015 1737 Full Code [id number]  Nicholes Mango, MD ED   01/09/2014 1418 01/10/2014 1803 Full Code [id number]  Deboraha Sprang, MD Inpatient   01/16/2013 2335 01/27/2013 2011 Full Code [id number]  Collene Gobble, MD Inpatient     Disposition Plan:  creatinine will need to improve prior to disposition.  Consultants:  Cardiology  Time spent: 26 minutes  [place]

## 2017-10-06 NOTE — Progress Notes (Signed)
Physical Therapy Treatment Patient Details Name: Debra Saunders MRN: [id number] DOB: 11/24/1945 Today's Date: 10/06/2017    History of Present Illness 72 yo female was admitted for SOB and CHF exacerbation with 4LO2 used for supplement.  Mild increase in troponin and passing blood with abd pain complaints, and PMHx:  pacemaker, stroke, CKD 3, DM, a-fib, COPD, prev smoker, lymphedema, severe mitral regurg    PT Comments    Pt received in bed, friend in room. Pt agreeable to participate, notes HD session will start soon. Patient progressing well overall, now supervision for bed mobility and transfers x5, AMB to 41ft without LOB and with RW. Pt most limited by dizziness with transition to EOB, and worsening DOE with AMB, VSS on 3L/min. Pt progressing well, will continue to follow.    Follow Up Recommendations  Home health PT;Supervision for mobility/OOB     Equipment Recommendations  Rolling walker with 5" wheels;Wheelchair (measurements PT)    Recommendations for Other Services       Precautions / Restrictions Precautions Precautions: Fall Restrictions Weight Bearing Restrictions: No    Mobility  Bed Mobility Overal bed mobility: Needs Assistance Bed Mobility: Supine to Sit;Sit to Supine     Supine to sit: Supervision Sit to supine: Supervision   General bed mobility comments: min additional effort, use of bed rails, but moves generally well; inititally dizzy, but no worsening, remains stable   Transfers Overall transfer level: Needs assistance Equipment used: Rolling walker (2 wheeled) Transfers: Sit to/from Stand Sit to Stand: Supervision;From elevated surface         General transfer comment: 5x from EOB to full upright for funcitonal strengthening. rests 20-45sec between reps d/t fatigue. mod effort required for performance of 1x  Ambulation/Gait Ambulation/Gait assistance: Supervision;Min guard Ambulation Distance (Feet): 32 Feet Assistive device: Rolling  walker (2 wheeled)     Gait velocity interpretation: <1.8 ft/sec, indicate of risk for recurrent falls General Gait Details: PT assists with lines, alternates AMB/retroAMB, no LOB noted, safe use of RW; increased DOE   Chief Strategy Officer    Modified Rankin (Stroke Patients Only)       Balance Overall balance assessment: Modified Independent;Mild deficits observed, not formally tested;History of Falls                                          Cognition Arousal/Alertness: Awake/alert Behavior During Therapy: WFL for tasks assessed/performed Overall Cognitive Status: Within Functional Limits for tasks assessed                                        Exercises      General Comments        Pertinent Vitals/Pain Pain Assessment: No/denies pain    Home Living                      Prior Function            PT Goals (current goals can now be found in the care plan section) Acute Rehab PT Goals Patient Stated Goal: To recover strength and go home PT Goal Formulation: With patient Time For Goal Achievement: 10/17/17 Potential to Achieve Goals: Good Progress towards PT goals: Progressing toward goals  Frequency    Min 2X/week      PT Plan Current plan remains appropriate    Co-evaluation              AM-PAC PT "6 Clicks" Daily Activity  Outcome Measure  Difficulty turning over in bed (including adjusting bedclothes, sheets and blankets)?: A Little Difficulty moving from lying on back to sitting on the side of the bed? : A Little Difficulty sitting down on and standing up from a chair with arms (e.g., wheelchair, bedside commode, etc,.)?: A Lot Help needed moving to and from a bed to chair (including a wheelchair)?: A Little Help needed walking in hospital room?: A Lot Help needed climbing 3-5 steps with a railing? : Total 6 Click Score: 14    End of Session Equipment Utilized  During Treatment: Gait belt;Oxygen Activity Tolerance: Patient tolerated treatment well;No increased pain;Patient limited by fatigue Patient left: with call bell/phone within reach;in bed;with bed alarm set;with family/visitor present Nurse Communication: Mobility status PT Visit Diagnosis: Unsteadiness on feet (R26.81);Other abnormalities of gait and mobility (R26.89);Muscle weakness (generalized) (M62.81);History of falling (Z91.81);Adult, failure to thrive (R62.7);Dizziness and giddiness (R42)     Time: 1848-5927 PT Time Calculation (min) (ACUTE ONLY): 22 min  Charges:  $Therapeutic Activity: 8-22 mins                    G Codes:      11:41 AM, 11-03-2017 Etta Grandchild, PT, DPT Physical Therapist - [facility]  [phone] ([place])     Sacha Topor C Nov 03, 2017, 11:39 AM

## 2017-10-06 NOTE — Progress Notes (Signed)
Pre HD  

## 2017-10-06 NOTE — Progress Notes (Signed)
Progress Note  Patient Name: Debra Saunders Date of Encounter: 10/06/2017  Primary Cardiologist: Debra Sacramento, MD   Subjective   No chest pain or shortness of breath.  Mild leg swelling is unchanged since admission.  Minimal urine output yesterday.  Nurse reported possible small amount of blood in bowel movement this morning.  Inpatient Medications    Scheduled Meds: . albuterol  2.5 mg Nebulization Q6H  . amiodarone  400 mg Oral BID  . aspirin EC  81 mg Oral Daily  . budesonide (PULMICORT) nebulizer solution  0.5 mg Nebulization BID  . doxycycline  100 mg Oral Q12H  . gabapentin  200 mg Oral TID  . insulin aspart  0-20 Units Subcutaneous TID WC  . insulin aspart  0-5 Units Subcutaneous QHS  . mupirocin ointment   Nasal BID  . [START ON 10/07/2017] predniSONE  20 mg Oral Q breakfast  . protein supplement shake  11 oz Oral BID BM  . rosuvastatin  10 mg Oral Daily  . sildenafil  20 mg Oral TID  . sodium chloride flush  3 mL Intravenous Q12H   Continuous Infusions: . sodium chloride    . sodium chloride 50 mL/hr at 10/06/17 0233  . heparin 1,100 Units/hr (10/05/17 1720)   PRN Meds: sodium chloride, acetaminophen, ALPRAZolam, ondansetron (ZOFRAN) IV, ondansetron, oxyCODONE-acetaminophen, promethazine, sodium chloride flush   Vital Signs    Vitals:   10/06/17 0203 10/06/17 0419 10/06/17 0758 10/06/17 1130  BP:  117/67 99/62   Pulse:  (!) 58 (!) 59 68  Resp:  20 18   Temp:  (!) 97.5 F (36.4 C) 97.6 F (36.4 C)   TempSrc:  Oral Oral   SpO2: 96% 96% 100% 96%  Weight:  242 lb 8.1 oz (110 kg)    Height:        Intake/Output Summary (Last 24 hours) at 10/06/2017 1155 Last data filed at 10/06/2017 0324 Gross per 24 hour  Intake 1524.9 ml  Output 117 ml  Net 1407.9 ml   Filed Weights   10/04/17 0433 10/05/17 0529 10/06/17 0419  Weight: 229 lb 11.2 oz (104.2 kg) 236 lb 8 oz (107.3 kg) 242 lb 8.1 oz (110 kg)    Telemetry    Atrial fibrillation with ventricular  pacing - Personally Reviewed  ECG    No new tracing.  Physical Exam   GEN: No acute distress.   Neck:  No gross JVD, though body habitus limits evaluation. Cardiac: RRR, no murmurs, rubs, or gallops.  Respiratory: Clear to auscultation bilaterally. GI: Obese but soft and nontender.  Unable to assess HSM due to body habitus. MS:  Trace to 1+ pretibial edema, left greater than right; No deformity. Neuro:  Nonfocal  Psych: Normal affect   Labs    Chemistry Recent Labs  Lab 10/04/17 0552 10/05/17 0748 10/06/17 0020  NA 132* 131* 125*  K 5.2* 4.7 5.6*  CL 92* 91* 88*  CO2 32 28 26  GLUCOSE 315* 236* 381*  BUN 52* 71* 83*  CREATININE 3.10* 4.13* 4.65*  CALCIUM 8.3* 7.9* 7.8*  GFRNONAA 14* 10* 8*  GFRAA 16* 11* 10*  ANIONGAP 8 12 11      Hematology Recent Labs  Lab 09/30/17 1545 10/03/17 0449 10/06/17 1012  WBC 7.8 6.5  --   RBC 3.84 3.58*  --   HGB 10.3* 9.8* 9.7*  HCT 32.7* 30.5*  --   MCV 85.1 85.1  --   MCH 26.9 27.5  --  MCHC 31.6* 32.3  --   RDW 17.1* 16.5*  --   PLT 175 174  --     Cardiac Enzymes Recent Labs  Lab 09/30/17 1545  TROPONINI 0.04*   No results for input(s): TROPIPOC in the last 168 hours.   BNP Recent Labs  Lab 09/30/17 1545  BNP 1,151.0*     DDimer No results for input(s): DDIMER in the last 168 hours.   Radiology    US Renal  Result Date: 10/04/2017 CLINICAL DATA:  72 year old female with acute renal failure. EXAM: RENAL / URINARY TRACT ULTRASOUND COMPLETE COMPARISON:  CT Abdomen and Pelvis 11/26/2016. FINDINGS: Right Kidney: Length: 10.9 centimeters. Right renal cortical echogenicity appears increased relative to the liver (image 3). No right hydronephrosis or renal mass. Left Kidney: Length: 10.9 centimeters. Left renal echogenicity similar to the right kidney. No left hydronephrosis or renal mass. Bladder: Largely decompressed, appears unremarkable. IMPRESSION: 1. No acute renal findings.  Largely decompressed urinary  bladder. 2. Possible chronic medical renal disease. Electronically Signed   By: Genevie Ann M.D.   On: 10/04/2017 15:29    Cardiac Studies   ECHO:  10/01/17 - Left ventricle: The cavity size was normal. There was mild concentric hypertrophy. Systolic function was normal. The estimated ejection fraction was in the range of 50% to 55%. Wall motion was normal; there were no regional wall motion abnormalities. The study is not technically sufficient to allow evaluation of LV diastolic function. - Mitral valve: There was moderate to severe regurgitation. - Left atrium: The atrium was moderately dilated. - Right ventricle: The cavity size was mildly dilated. Wall thickness was normal. Systolic function was mildly reduced. - Right atrium: The atrium was moderately dilated. - Tricuspid valve: There was moderate regurgitation. - Pulmonary arteries: Systolic pressure was severely elevated PA peak pressure: 69 mm Hg (S).    Patient Profile     72 y.o. female with a history of nonobstructive CAD, chronic combined systolic and diastolic congestive heart failure, persistent atrial fibrilation, symptomatic bradycardia status post permanent pacemaker, frequent PVCs, chronic respiratory failure with hypoxia on home O2, COPD, hypertension, hyperlipidemia, obesity, type 2 diabetes mellitus, traumatic subarachnoid hemorrhage in the setting of Xarelto, left lower extremity DVT status post IVC filter, chronic right sided atypical chest pain, chronic left lower extremity lymphedema, prior stroke, sleep apnea, and obesity, who is being seen for the evaluation ofacute on chronic combined heart failure in the setting of persistent atrial fibrillationat the request of Dr. Posey Saunders.  Assessment & Plan    Acute on chronic systolic and diastolic heart failure and pulmonary hypertension Volume status is difficult to assess, though overall, [name] has only mild edema and does not appear greatly volume  overloaded.  Additionally, she became quite hypotensive with resultant ATN with diuresis earlier in this admission.  She likely is volume dependent due to right heart failure and pulmonary hypertension.  Maintain net even fluid balance.  Continue sildenafil, though this may need to be held if hypotension becomes problematic with hemodialysis.  Nonobstructive coronary artery disease No symptoms.  Continue aspirin 81 mg daily and rosuvastatin 10 mg daily.  Moderate to severe mitral regurgitation Likely contributing to some of her volume issues, though appears to be better than in the past when her LVEF was also significantly impaired.  Continue blood pressure and volume management.  Consider repeat echo when sinus rhythm has been restored.  Persistent atrial fibrillation Ventricular rate is adequately controlled and is predominantly paced at 60 bpm.  Apixaban switched to heparin infusion yesterday due to oliguric renal failure.  If no evidence of significant bleeding, continue heparin infusion.  Continue amiodarone loading; may need DCCV once adequately loaded if she remains in atrial fibrillation.  Acute on chronic renal failure Thought to be due to acute tubular necrosis in the setting of hypotension.  Ms. Larocca remains very oliguric with further rise in gotten from 4.1->4.7.  Potassium also rising, up to 5.6 today.  Plan for dialysis catheter placement and hemodialysis today.  Per nephrology.  Avoid nephrotoxic drugs.  For questions or updates, please contact [place] Please consult [email or web address] for contact info under [facility] Cardiology.     Signed, Nelva Bush, MD  10/06/2017, 11:55 AM

## 2017-10-06 NOTE — Telephone Encounter (Signed)
Copied from [place] [id number]. Topic: General - Other >> Oct 06, 2017  2:37 PM Synthia Innocent wrote: Reason for CRM: Wanted to make Dr [name] aware that she is in hospital with kidney failure

## 2017-10-06 NOTE — Telephone Encounter (Signed)
Noted  

## 2017-10-06 NOTE — Progress Notes (Signed)
[facility]  ROUNDING NOTE   Subjective:  Creatinine up to 4.65 with a BUN of 83 today. Urine output was only 117 cc. Patient is agreeable to initiation of temporary dialysis.  Objective:  Vital signs in last 24 hours:  Temp:  [97.5 F (36.4 C)-98.3 F (36.8 C)] 97.6 F (36.4 C) (04/16 0758) Pulse Rate:  [58-62] 59 (04/16 0758) Resp:  [18-22] 18 (04/16 0758) BP: (95-121)/(42-80) 99/62 (04/16 0758) SpO2:  [95 %-100 %] 100 % (04/16 0758) Weight:  [110 kg (242 lb 8.1 oz)] 110 kg (242 lb 8.1 oz) (04/16 0419)  Weight change: 2.724 kg (6 lb 0.1 oz) Filed Weights   10/04/17 0433 10/05/17 0529 10/06/17 0419  Weight: 104.2 kg (229 lb 11.2 oz) 107.3 kg (236 lb 8 oz) 110 kg (242 lb 8.1 oz)    Intake/Output: I/O last 3 completed shifts: In: 2269.9 [I.V.:2269.9] Out: 656 [CLEXN:170]   Intake/Output this shift:  No intake/output data recorded.  Physical Exam: General: No acute distress  Head: Normocephalic, atraumatic. Moist oral mucosal membranes  Eyes: Anicteric  Neck: Supple, trachea midline  Lungs:  Clear to auscultation, normal effort  Heart: S1S2 no rubs  Abdomen:  Soft, nontender, bowel sounds present  Extremities: 1+ peripheral edema.  Neurologic: Awake, alert, following commands  Skin: No lesions       Basic Metabolic Panel: Recent Labs  Lab 09/30/17 1924 10/01/17 0448 10/02/17 0429 10/02/17 1557 10/03/17 0449 10/04/17 0552 10/05/17 0748 10/06/17 0020 10/06/17 1012  NA  --  141 140  --  138 132* 131* 125*  --   K  --  2.9* 2.8* 3.6 2.9* 5.2* 4.7 5.6*  --   CL  --  103 95*  --  92* 92* 91* 88*  --   CO2  --  32 37*  --  36* 32 28 26  --   GLUCOSE  --  116* 135*  --  139* 315* 236* 381*  --   BUN  --  26* 31*  --  34* 52* 71* 83*  --   CREATININE  --  1.26* 1.19*  --  1.44* 3.10* 4.13* 4.65*  --   CALCIUM  --  9.1 9.1  --  8.7* 8.3* 7.9* 7.8*  --   MG 1.3* 1.6*  --   --  2.2  --   --   --   --   PHOS  --   --   --   --   --   --   --   --   5.5*    Liver Function Tests: No results for input(s): AST, ALT, ALKPHOS, BILITOT, PROT, ALBUMIN in the last 168 hours. No results for input(s): LIPASE, AMYLASE in the last 168 hours. No results for input(s): AMMONIA in the last 168 hours.  CBC: Recent Labs  Lab 09/30/17 1545 10/03/17 0449 10/06/17 1012  WBC 7.8 6.5  --   HGB 10.3* 9.8* 9.7*  HCT 32.7* 30.5*  --   MCV 85.1 85.1  --   PLT 175 174  --     Cardiac Enzymes: Recent Labs  Lab 09/30/17 1545  TROPONINI 0.04*    BNP: Invalid input(s): POCBNP  CBG: Recent Labs  Lab 10/05/17 1144 10/05/17 1711 10/05/17 2125 10/06/17 0753 10/06/17 1125  GLUCAP 235* 322* 322* 289* 225*    Microbiology: Results for orders placed or performed during the hospital encounter of 09/30/17  MRSA PCR Screening     Status: Abnormal  Collection Time: 10/01/17  9:37 AM  Result Value Ref Range Status   MRSA by PCR POSITIVE (A) NEGATIVE Final    Comment:        [name] (FDA approved for NASAL specimens only), is one component of a comprehensive MRSA colonization surveillance program. It is not intended to diagnose MRSA infection nor to guide or monitor treatment for MRSA infections. RESULT CALLED TO, READ BACK BY AND VERIFIED WITH:  Gildardo Pounds AT 1207 10/01/17 SDR Performed at [facility], [address] [place], [place] [address]     Coagulation Studies: Recent Labs    10/05/17 0925  LABPROT 17.6*  INR 1.46    Urinalysis: No results for input(s): COLORURINE, LABSPEC, PHURINE, GLUCOSEU, HGBUR, BILIRUBINUR, KETONESUR, PROTEINUR, UROBILINOGEN, NITRITE, LEUKOCYTESUR in the last 72 hours.  Invalid input(s): APPERANCEUR    Imaging: US Renal  Result Date: 10/04/2017 CLINICAL DATA:  72 year old female with acute renal failure. EXAM: RENAL / URINARY TRACT ULTRASOUND COMPLETE COMPARISON:  CT Abdomen and Pelvis 11/26/2016. FINDINGS: Right Kidney: Length: 10.9 centimeters. Right renal  cortical echogenicity appears increased relative to the liver (image 3). No right hydronephrosis or renal mass. Left Kidney: Length: 10.9 centimeters. Left renal echogenicity similar to the right kidney. No left hydronephrosis or renal mass. Bladder: Largely decompressed, appears unremarkable. IMPRESSION: 1. No acute renal findings.  Largely decompressed urinary bladder. 2. Possible chronic medical renal disease. Electronically Signed   By: Genevie Ann M.D.   On: 10/04/2017 15:29     Medications:   . sodium chloride    . sodium chloride 50 mL/hr at 10/06/17 0213  . heparin 1,100 Units/hr (10/05/17 1720)   . albuterol  2.5 mg Nebulization Q6H  . amiodarone  400 mg Oral BID  . aspirin EC  81 mg Oral Daily  . budesonide (PULMICORT) nebulizer solution  0.5 mg Nebulization BID  . doxycycline  100 mg Oral Q12H  . gabapentin  200 mg Oral TID  . insulin aspart  0-20 Units Subcutaneous TID WC  . insulin aspart  0-5 Units Subcutaneous QHS  . mupirocin ointment   Nasal BID  . [START ON 10/07/2017] predniSONE  20 mg Oral Q breakfast  . protein supplement shake  11 oz Oral BID BM  . rosuvastatin  10 mg Oral Daily  . sildenafil  20 mg Oral TID  . sodium chloride flush  3 mL Intravenous Q12H   sodium chloride, acetaminophen, ALPRAZolam, ondansetron (ZOFRAN) IV, ondansetron, oxyCODONE-acetaminophen, promethazine, sodium chloride flush  Assessment/ Plan:  72 y.o. female with a PMHx of chronic diastolic heart failure, atrial fibrillation, chronic hypercarbic respiratory failure, COPD, morbid obesity, hyperlipidemia, diabetes mellitus type 2, chronic kidney disease stage II, history of intracranial hemorrhage who was admitted to [facility] on 09/30/2017 for evaluation of shortness of breath.   1.  Acute renal failure/chronic kidney disease stage II.  Acute renal failure appears to be multifactorial.  Contributions from over diuresis as well as relative hypotension.   -Urine output was only 117 cc over the preceding  24 hours.  In addition BUN and creatinine are both up from yesterday.  Patient is agreeable to initiation of temporary renal replacement therapy.  We will consult with vascular surgery for temporary dialysis catheter placement.  2.  Anemia of chronic kidney disease.    Hemoglobin currently 9.7.  Hold off on Epogen for now.  3.  Hyperkalemia.  Potassium elevated again today at 5.6.  This will be lowered with dialysis.  4.  Hyponatremia.  Serum sodium currently 125.  This should improve with dialysis as well.     LOS: 6 Corran Lalone 4/16/201911:27 AM

## 2017-10-06 NOTE — Care Management (Signed)
Debra Saunders HD liaison notified of initiation of temporary dialysis.

## 2017-10-06 NOTE — Progress Notes (Signed)
[place] for heparin Indication: atrial fibrillation  Allergies  Allergen Reactions  . Aspirin Hives and Other (See Comments)    Pt states that she is unable to take because she had bleeding in her brain.    . Other Other (See Comments)    Pt states that she is unable to take blood thinners because she had bleeding in her brain.  Blood Thinners-per doctor at [facility]  . Penicillins Hives, Shortness Of Breath, Swelling and Other (See Comments)    Has patient had a PCN reaction causing immediate rash, facial/tongue/throat swelling, SOB or lightheadedness with hypotension: Yes Has patient had a PCN reaction causing severe rash involving mucus membranes or skin necrosis: No Has patient had a PCN reaction that required hospitalization No Has patient had a PCN reaction occurring within the last 10 years: No If all of the above answers are "NO", then may proceed with Cephalosporin use.    Patient Measurements: Height: 5\' 1"  (154.9 cm) Weight: 236 lb 8 oz (107.3 kg) IBW/kg (Calculated) : 47.8 Heparin Dosing Weight: 74 kg  Vital Signs: Temp: 97.6 F (36.4 C) (04/15 1933) Temp Source: Oral (04/15 1933) BP: 95/42 (04/15 1933) Pulse Rate: 62 (04/15 2310)  Labs: Recent Labs    10/03/17 0449 10/04/17 0552 10/05/17 0748 10/05/17 0925 10/06/17 0020  HGB 9.8*  --   --   --   --   HCT 30.5*  --   --   --   --   PLT 174  --   --   --   --   APTT  --   --   --  32 68*  LABPROT  --   --   --  17.6*  --   INR  --   --   --  1.46  --   HEPARINUNFRC  --   --   --  >3.60* 2.54*  CREATININE 1.44* 3.10* 4.13*  --   --     Estimated Creatinine Clearance: 13.5 mL/min (A) (by C-G formula based on SCr of 4.13 mg/dL (H)).   Medical History: Past Medical History:  Diagnosis Date  . Arthritis   . Atrial fibrillation, persistent ([place])    a. s/p TEE-guided DCCV on 08/20/2017  . Chest pain    a. 01/2012 Cath: nonobs dzs;  c. 04/2014 MV: No ischemia.  .  Chronic back pain   . Chronic combined systolic (congestive) and diastolic (congestive) heart failure ([place])    a. Dx 2005 @ [facility];  b. 02/2014 Echo: Ef 55-60%, no rwma, mild MR, PASP 75mmHg. c. 07/2017: EF 30-35%, diffuse HK, trivial AI, severe MR, moderate TR  . Chronic respiratory failure (H[facility])    a. on home O2  . COPD (chronic obstructive pulmonary disease) ([place])    a. Home O2 - 3lpm  . Gastritis   . History of intracranial hemorrhage    a. 6/14 described by neurosurgery as "small frontal posttraumatic SAH " - pt was on xarelto @ the time.  . Hyperlipidemia   . Hypertension   . Lipoma of back   . Lymphedema    a. Chronic LLE edema.  . Obesity   . Sepsis with metabolic encephalopathy ([place])   . Sleep apnea   . Streptococcal bacteremia   . Stroke ([place])   . Symptomatic bradycardia    a. s/p BSX PPM.  . Thyroid nodule   . Type II diabetes mellitus ([place])   . Urinary incontinence  Medications:  Patient takes apixaban 5mg  BID at home for afib. She was initially started on apixaban 5mg  BID when she was admitted, but her dose was changed to apixaban 2.5mg  BID on 4/14 due to her increase in Scr (1.4 --> 3.1). Cardiology stated that decrease in apixaban dose was for instability of patient's renal function.   Assessment: Patient is a 72 yo female admitted with exacerbation of CHF in the setting of persistent afib.  As above, patient was on apixaban 2.5mg  BID due to unstable renal function. Per cardiology, as renal function continues to decline (Scr 4.13 on 4/15) wish to transition patient from apixaban to heparin. Pharmacy has been consulted to dose and manage heparin. Per cardiology, will start heparin 8 hours after last dose of apixaban.   Goal of Therapy:  Heparin level 0.3-0.7 units/ml  APTT 66-102s Monitor platelets by anticoagulation protocol: Yes   Plan:  Baseline labs collected at 0921 and last apixaban dose at 0800. HL > 3.6, APTT 32. Will continue to monitor until HL and  APTT's correlate.  Start heparin infusion at 1100 units/hr  Will start heparin 8 hours after last dose of apixaban (0800). Will not give bolus. Will obtain follow up heparin level in 8 hours. Pharmacy will continue to monitor and adjust.   4/16 0030 aPTT 68, heparin level 2.54. Continue current regimen. Recheck heparin level, APTT, and CBC with tomorrow AM labs.  Sim Boast, PharmD, BCPS  10/06/17 3:21 AM

## 2017-10-06 NOTE — Progress Notes (Signed)
Hemodialysis completed without issue. Total UF 0 as ordered. Patient tolerated well. Report called to primary RN.

## 2017-10-06 NOTE — Progress Notes (Signed)
fs [phone]

## 2017-10-07 ENCOUNTER — Ambulatory Visit: Payer: Medicare HMO | Admitting: Family Medicine

## 2017-10-07 ENCOUNTER — Encounter: Payer: Self-pay | Admitting: Vascular Surgery

## 2017-10-07 LAB — BASIC METABOLIC PANEL
Anion gap: 9 (ref 5–15)
BUN: 76 mg/dL — ABNORMAL HIGH (ref 6–20)
CO2: 28 mmol/L (ref 22–32)
Calcium: 8.3 mg/dL — ABNORMAL LOW (ref 8.9–10.3)
Chloride: 98 mmol/L — ABNORMAL LOW (ref 101–111)
Creatinine, Ser: 3.76 mg/dL — ABNORMAL HIGH (ref 0.44–1.00)
GFR calc Af Amer: 13 mL/min — ABNORMAL LOW (ref >60)
GFR calc non Af Amer: 11 mL/min — ABNORMAL LOW (ref >60)
Glucose, Bld: 128 mg/dL — ABNORMAL HIGH (ref 65–99)
Potassium: 4.5 mmol/L (ref 3.5–5.1)
Sodium: 135 mmol/L (ref 135–145)

## 2017-10-07 LAB — GLUCOSE, CAPILLARY
Glucose-Capillary: 121 mg/dL — ABNORMAL HIGH (ref 65–99)
Glucose-Capillary: 134 mg/dL — ABNORMAL HIGH (ref 65–99)
Glucose-Capillary: 232 mg/dL — ABNORMAL HIGH (ref 65–99)
Glucose-Capillary: 185 mg/dL — ABNORMAL HIGH (ref 65–99)

## 2017-10-07 LAB — CBC
HCT: 30.2 % — ABNORMAL LOW (ref 35.0–47.0)
Hemoglobin: 9.7 g/dL — ABNORMAL LOW (ref 12.0–16.0)
MCH: 27.1 pg (ref 26.0–34.0)
MCHC: 32.1 g/dL (ref 32.0–36.0)
MCV: 84.7 fL (ref 80.0–100.0)
Platelets: 192 10*3/uL (ref 150–440)
RBC: 3.57 MIL/uL — ABNORMAL LOW (ref 3.80–5.20)
RDW: 16.6 % — ABNORMAL HIGH (ref 11.5–14.5)
WBC: 7.9 10*3/uL (ref 3.6–11.0)

## 2017-10-07 LAB — APTT
aPTT: 122 seconds — ABNORMAL HIGH (ref 24–36)
aPTT: 97 seconds — ABNORMAL HIGH (ref 24–36)
aPTT: 77 seconds — ABNORMAL HIGH (ref 24–36)

## 2017-10-07 LAB — HEPARIN LEVEL (UNFRACTIONATED): Heparin Unfractionated: 1.53 IU/mL — ABNORMAL HIGH (ref 0.30–0.70)

## 2017-10-07 LAB — HEPATITIS B SURFACE ANTIBODY, QUANTITATIVE: Hepatitis B-Post: <3.1 m[IU]/mL — ABNORMAL LOW (ref >9.9)

## 2017-10-07 LAB — PHOSPHORUS: Phosphorus: 5.3 mg/dL — ABNORMAL HIGH (ref 2.5–4.6)

## 2017-10-07 LAB — HEPATITIS B CORE ANTIBODY, TOTAL: Hep B Core Total Ab: NEGATIVE

## 2017-10-07 LAB — HEPATITIS B SURFACE ANTIGEN: Hepatitis B Surface Ag: NEGATIVE

## 2017-10-07 MED ORDER — INSULIN ASPART 100 UNIT/ML ~~LOC~~ SOLN: 0.0000 [IU] | Freq: Every day | SUBCUTANEOUS | Status: DC

## 2017-10-07 MED ORDER — INSULIN ASPART 100 UNIT/ML ~~LOC~~ SOLN
0.0000 [IU] | Freq: Three times a day (TID) | SUBCUTANEOUS | Status: DC
  Administered 2017-10-07: 3 [IU] via SUBCUTANEOUS
  Administered 2017-10-08: 2 [IU] via SUBCUTANEOUS
  Administered 2017-10-08: 1 [IU] via SUBCUTANEOUS
  Administered 2017-10-09: 2 [IU] via SUBCUTANEOUS
  Administered 2017-10-09: 3 [IU] via SUBCUTANEOUS
  Filled 2017-10-07 (×5): qty 1

## 2017-10-07 MED ORDER — GABAPENTIN 300 MG PO CAPS
300.0000 mg | ORAL_CAPSULE | Freq: Every day | ORAL | Status: DC
  Administered 2017-10-08 – 2017-10-21 (×14): 300 mg via ORAL
  Filled 2017-10-07 (×14): qty 1

## 2017-10-07 MED ORDER — OXYMETAZOLINE HCL 0.05 % NA SOLN
4.0000 | Freq: Two times a day (BID) | NASAL | Status: AC
  Administered 2017-10-07 (×2): 4 via NASAL
  Filled 2017-10-07: qty 15

## 2017-10-07 MED ORDER — IPRATROPIUM-ALBUTEROL 0.5-2.5 (3) MG/3ML IN SOLN
3.0000 mL | RESPIRATORY_TRACT | Status: DC | PRN
  Administered 2017-10-08: 3 mL via RESPIRATORY_TRACT
  Filled 2017-10-07: qty 3

## 2017-10-07 MED ORDER — IPRATROPIUM-ALBUTEROL 0.5-2.5 (3) MG/3ML IN SOLN
3.0000 mL | Freq: Three times a day (TID) | RESPIRATORY_TRACT | Status: DC
  Administered 2017-10-08 – 2017-10-22 (×40): 3 mL via RESPIRATORY_TRACT
  Filled 2017-10-07 (×43): qty 3

## 2017-10-07 NOTE — Progress Notes (Signed)
[place] for heparin Indication: atrial fibrillation  Allergies  Allergen Reactions  . Aspirin Hives and Other (See Comments)    Pt states that she is unable to take because she had bleeding in her brain.    . Other Other (See Comments)    Pt states that she is unable to take blood thinners because she had bleeding in her brain.  Blood Thinners-per doctor at [facility]  . Penicillins Hives, Shortness Of Breath, Swelling and Other (See Comments)    Has patient had a PCN reaction causing immediate rash, facial/tongue/throat swelling, SOB or lightheadedness with hypotension: Yes Has patient had a PCN reaction causing severe rash involving mucus membranes or skin necrosis: No Has patient had a PCN reaction that required hospitalization No Has patient had a PCN reaction occurring within the last 10 years: No If all of the above answers are "NO", then may proceed with Cephalosporin use.    Patient Measurements: Height: 5\' 1"  (154.9 cm) Weight: 245 lb 4.8 oz (111.3 kg) IBW/kg (Calculated) : 47.8 Heparin Dosing Weight: 74 kg  Vital Signs: Temp: 97.7 F (36.5 C) (04/17 0443) Temp Source: Oral (04/17 0443) BP: 115/67 (04/17 0443) Pulse Rate: 59 (04/17 0443)  Labs: Recent Labs    10/05/17 0748 10/05/17 0925 10/06/17 0020 10/06/17 1012 10/07/17 0608  HGB  --   --   --  9.7* 9.7*  HCT  --   --   --   --  30.2*  PLT  --   --   --   --  192  APTT  --  32 68*  --  122*  LABPROT  --  17.6*  --   --   --   INR  --  1.46  --   --   --   HEPARINUNFRC  --  >3.60* 2.54*  --  1.53*  CREATININE 4.13*  --  4.65*  --  3.76*    Estimated Creatinine Clearance: 15.2 mL/min (A) (by C-G formula based on SCr of 3.76 mg/dL (H)).   Medical History: Past Medical History:  Diagnosis Date  . Arthritis   . Atrial fibrillation, persistent ([place])    a. s/p TEE-guided DCCV on 08/20/2017  . Chest pain    a. 01/2012 Cath: nonobs dzs;  c. 04/2014 MV: No ischemia.   . Chronic back pain   . Chronic combined systolic (congestive) and diastolic (congestive) heart failure ([place])    a. Dx 2005 @ [facility];  b. 02/2014 Echo: Ef 55-60%, no rwma, mild MR, PASP 64mmHg. c. 07/2017: EF 30-35%, diffuse HK, trivial AI, severe MR, moderate TR  . Chronic respiratory failure (HCC)    a. on home O2  . COPD (chronic obstructive pulmonary disease) ([place])    a. Home O2 - 3lpm  . Gastritis   . History of intracranial hemorrhage    a. 6/14 described by neurosurgery as "small frontal posttraumatic SAH " - pt was on xarelto @ the time.  . Hyperlipidemia   . Hypertension   . Lipoma of back   . Lymphedema    a. Chronic LLE edema.  . Obesity   . Sepsis with metabolic encephalopathy ([place])   . Sleep apnea   . Streptococcal bacteremia   . Stroke ([place])   . Symptomatic bradycardia    a. s/p BSX PPM.  . Thyroid nodule   . Type II diabetes mellitus ([place])   . Urinary incontinence     Medications:  Patient  takes apixaban 5mg  BID at home for afib. She was initially started on apixaban 5mg  BID when she was admitted, but her dose was changed to apixaban 2.5mg  BID on 4/14 due to her increase in Scr (1.4 --> 3.1). Cardiology stated that decrease in apixaban dose was for instability of patient's renal function.   Assessment: Patient is a 72 yo female admitted with exacerbation of CHF in the setting of persistent afib.  As above, patient was on apixaban 2.5mg  BID due to unstable renal function. Per cardiology, as renal function continues to decline (Scr 4.13 on 4/15) wish to transition patient from apixaban to heparin. Pharmacy has been consulted to dose and manage heparin. Per cardiology, will start heparin 8 hours after last dose of apixaban.   Goal of Therapy:  Heparin level 0.3-0.7 units/ml  APTT 66-102s Monitor platelets by anticoagulation protocol: Yes   Plan:  4/17 APTT elevated at 122. Will reduced heparin drip rate from 1100 units/hr to 950 units/hr. RN made aware. No reports  of any further blood in stool Hgb is stable. Recheck APTT in 8 hours. APPT and HL not coralating yet.  Ramond Dial, Pharm.D, BCPS Clinical Pharmacist  10/07/17 7:50 AM

## 2017-10-07 NOTE — Progress Notes (Signed)
Pre HD assessment   10/07/17 0945  Vital Signs  Temp 98.3 F (36.8 C)  Temp Source Oral  Pulse Rate 61  Pulse Rate Source Monitor  Resp 19  BP (!) 100/53  BP Location Right Arm  BP Method Automatic  Patient Position (if appropriate) Lying  Oxygen Therapy  SpO2 99 %  O2 Device Nasal Cannula  O2 Flow Rate (L/min) 3 L/min  Pain Assessment  Pain Scale 0-10  Pain Score 0  Dialysis Weight  Weight 112.8 kg (248 lb 10.9 oz)  Type of Weight Pre-Dialysis  Time-Out for Hemodialysis  What Procedure? HD  Pt Identifiers(min of two) First/Last Name;MRN/Account#  Correct Site? Yes  Correct Side? Yes  Correct Procedure? Yes  Consents Verified? Yes  Rad Studies Available? N/A  Safety Precautions Reviewed? Yes  Engineer, civil (consulting) Number  ([id number])  Station Number 4  UF/Alarm Test Passed  Conductivity: Meter 13.8  Conductivity: Machine  14.2  pH 7.4  Reverse Osmosis main  Normal Saline Lot Number [id number]  Dialyzer Lot Number [id number]  Disposable Set Lot Number [id number]  Machine Temperature 98.6 F (37 C)  Musician and Audible Yes  Blood Lines Intact and Secured Yes  Pre Treatment Patient Checks  Vascular access used during treatment Catheter  Hepatitis B Surface Antigen Results Negative  Date Hepatitis B Surface Antigen Drawn 10/06/17  Isolation Initiated Yes (contact, MRSA)  Hepatitis B Surface Antibody  (unk, )  Date Hepatitis B Surface Antibody Drawn 10/05/17  Hemodialysis Consent Verified Yes  Hemodialysis Standing Orders Initiated Yes  ECG (Telemetry) Monitor On Yes  Prime Ordered Normal Saline  Length of  DialysisTreatment -hour(s) 2 Hour(s)  Dialyzer Elisio 17H NR  Dialysate 3K, 2.5 Ca  Dialysis Anticoagulant None  Dialysate Flow Ordered 500  Blood Flow Rate Ordered 250 mL/min  Ultrafiltration Goal 0 Liters  Pre Treatment Labs Phosphorus  Dialysis Blood Pressure Support Ordered Normal Saline  Education / Care Plan  Dialysis Education Provided Yes   Documented Education in Care Plan Yes  Hemodialysis Catheter Right Femoral vein Double-lumen  Placement Date/Time: 10/06/17 1749   Placed prior to admission: No  Local Anesthetic: None  Orientation: Right  Access Location: Femoral vein  Hemodialysis Catheter Type: Double-lumen  Site Condition No complications  Blue Lumen Status Heparin locked  Red Lumen Status Heparin locked  Dressing Type Biopatch  Dressing Status Clean;Dry;Intact  Drainage Description None

## 2017-10-07 NOTE — Progress Notes (Signed)
HD tx start   10/07/17 0951  Vital Signs  Pulse Rate 62  Pulse Rate Source Monitor  Resp (!) 21  BP (!) 101/57  BP Location Right Arm  BP Method Automatic  Patient Position (if appropriate) Lying  Oxygen Therapy  SpO2 99 %  O2 Device Nasal Cannula  O2 Flow Rate (L/min) 3 L/min  During Hemodialysis Assessment  Blood Flow Rate (mL/min) 250 mL/min  Arterial Pressure (mmHg) -90 mmHg  Venous Pressure (mmHg) 100 mmHg  Transmembrane Pressure (mmHg) 60 mmHg  Ultrafiltration Rate (mL/min) 250 mL/min  Dialysate Flow Rate (mL/min) 500 ml/min  Conductivity: Machine  14.2  HD Safety Checks Performed Yes  Dialysis Fluid Bolus Normal Saline  Bolus Amount (mL) 250 mL  Intra-Hemodialysis Comments Tx initiated  Hemodialysis Catheter Right Femoral vein Double-lumen  Placement Date/Time: 10/06/17 1749   Placed prior to admission: No  Local Anesthetic: None  Orientation: Right  Access Location: Femoral vein  Hemodialysis Catheter Type: Double-lumen  Blue Lumen Status Infusing  Red Lumen Status Infusing

## 2017-10-07 NOTE — Progress Notes (Signed)
Patient ID: Debra Saunders, female   DOB: 1945-08-24, 72 y.o.   MRN: [id number]  [place] PROGRESS NOTE  LEILENE DIPRIMA LEX:[id number] DOB: 03/04/1946 DOA: 09/30/2017 PCP: [name], MD  HPI/Subjective: Patient feels wiped out after dialysis.  Starting to make urine.  Some shortness of breath today.  No more blood in the bowel movements.  Did have a bloody nose.  Objective: Vitals:   10/07/17 1237 10/07/17 1240  BP:  122/64  Pulse: (!) 58 (!) 59  Resp: 18 18  Temp: 98.5 F (36.9 C)   SpO2: 100% 100%    Filed Weights   10/07/17 0443 10/07/17 0945 10/07/17 1205  Weight: 111.3 kg (245 lb 4.8 oz) 112.8 kg (248 lb 10.9 oz) 113 kg (249 lb 1.9 oz)    ROS: Review of Systems  Constitutional: Negative for chills and fever.  Eyes: Negative for blurred vision.  Respiratory: Positive for shortness of breath. Negative for cough and wheezing.   Cardiovascular: Negative for chest pain.  Gastrointestinal: Negative for abdominal pain, constipation, diarrhea, nausea and vomiting.  Genitourinary: Negative for dysuria.  Musculoskeletal: Negative for joint pain.  Neurological: Negative for dizziness and headaches.   Exam: Physical Exam  Constitutional: She is oriented to person, place, and time.  HENT:  Nose: No mucosal edema.  Mouth/Throat: No oropharyngeal exudate.  Bleeding from right nostril.  Eyes: Pupils are equal, round, and reactive to light. Conjunctivae, EOM and lids are normal.  Neck: No JVD present. Carotid bruit is not present. No edema present. No thyroid mass and no thyromegaly present.  Cardiovascular: S1 normal and S2 normal. An irregularly irregular rhythm present. Exam reveals no gallop.  No murmur heard. Pulses:      Dorsalis pedis pulses are 2+ on the right side, and 2+ on the left side.  Respiratory: No respiratory distress. She has decreased breath sounds in the right lower field and the left lower field. She has no wheezes. She has no rhonchi. She  has no rales.  GI: Soft. Bowel sounds are normal. There is no tenderness.  Musculoskeletal:       Right ankle: She exhibits swelling.       Left ankle: She exhibits swelling.  Lymphadenopathy:    She has no cervical adenopathy.  Neurological: She is alert and oriented to person, place, and time. No cranial nerve deficit.  Skin: Skin is warm. No rash noted. Nails show no clubbing.  Psychiatric: She has a normal mood and affect.      Data Reviewed: Basic Metabolic Panel: Recent Labs  Lab 09/30/17 1924 10/01/17 0448  10/03/17 0449 10/04/17 4496 10/05/17 0748 10/06/17 0020 10/06/17 1012 10/07/17 0608  NA  --  141   < > 138 132* 131* 125*  --  135  K  --  2.9*   < > 2.9* 5.2* 4.7 5.6*  --  4.5  CL  --  103   < > 92* 92* 91* 88*  --  98*  CO2  --  32   < > 36* 32 28 26  --  28  GLUCOSE  --  116*   < > 139* 315* 236* 381*  --  128*  BUN  --  26*   < > 34* 52* 71* 83*  --  76*  CREATININE  --  1.26*   < > 1.44* 3.10* 4.13* 4.65*  --  3.76*  CALCIUM  --  9.1   < > 8.7* 8.3* 7.9* 7.8*  --  8.3*  MG 1.3* 1.6*  --  2.2  --   --   --   --   --   PHOS  --   --   --   --   --   --   --  5.5* 5.3*   < > = values in this interval not displayed.   Acute kidney injury CBC: Recent Labs  Lab 09/30/17 1545 10/03/17 0449 10/06/17 1012 10/07/17 0608  WBC 7.8 6.5  --  7.9  HGB 10.3* 9.8* 9.7* 9.7*  HCT 32.7* 30.5*  --  30.2*  MCV 85.1 85.1  --  84.7  PLT 175 174  --  192   Cardiac Enzymes: Recent Labs  Lab 09/30/17 1545  TROPONINI 0.04*   BNP (last 3 results) Recent Labs    08/04/17 1552 08/16/17 0925 09/30/17 1545  BNP 175.0* 655.0* 1,151.0*     CBG: Recent Labs  Lab 10/06/17 1125 10/06/17 1735 10/06/17 2101 10/07/17 0744 10/07/17 1234  GLUCAP 225* 278* 264* 121* 134*    Recent Results (from the past 240 hour(s))  MRSA PCR Screening     Status: Abnormal   Collection Time: 10/01/17  9:37 AM  Result Value Ref Range Status   MRSA by PCR POSITIVE (A) NEGATIVE  Final    Comment:        [name] (FDA approved for NASAL specimens only), is one component of a comprehensive MRSA colonization surveillance program. It is not intended to diagnose MRSA infection nor to guide or monitor treatment for MRSA infections. RESULT CALLED TO, READ BACK BY AND VERIFIED WITH:  Gildardo Pounds AT 1207 10/01/17 SDR Performed at [facility], [address] [place], [place] [address]       Scheduled Meds: . albuterol  2.5 mg Nebulization Q6H  . amiodarone  400 mg Oral BID  . budesonide (PULMICORT) nebulizer solution  0.5 mg Nebulization BID  . doxycycline  100 mg Oral Q12H  . [START ON 10/08/2017] gabapentin  300 mg Oral QHS  . insulin aspart  0-5 Units Subcutaneous QHS  . insulin aspart  0-9 Units Subcutaneous TID WC  . mupirocin ointment   Nasal BID  . oxymetazoline  4 spray Each Nare BID  . predniSONE  10 mg Oral Q breakfast  . protein supplement shake  11 oz Oral BID BM  . rosuvastatin  10 mg Oral Daily  . sildenafil  20 mg Oral TID  . sodium chloride flush  3 mL Intravenous Q12H   Continuous Infusions: . sodium chloride    . heparin 950 Units/hr (10/07/17 0906)    Assessment/Plan:  1. Acute kidney injury on CKD stage 3.  Likely over diuresis and hypotension .  I will hold Lasix, Spironolactone and losartan.  Nephrology did second dialysis session today.  The patient is starting to make urine so that is a good sign.  Discontinue IV fluids. 2. Relative hypotension.  Hold all medications that can cause hypotension. 3. Acute on chronic diastolic congestive heart failure.  Patient has moderate to severe mitral valve regurgitation.   Holding all medications secondary to hypotension and acute kidney injury.  Watch closely with gentle IV fluids. 4. Chronic atrial fibrillation on heparin drip and amiodarone.  Holding Toprol. 5. Chronic hypoxic hypercarbic respiratory failure.  Chronically on 4 L of oxygen. 6. Morbid obesity.   Weight loss needed 7. COPD exacerbation, continue nebulizer treatments.   taper prednisone to off.  Since lungs sound better this could  be more COPD rather than heart failure.  8. Hyperlipidemia unspecified on Crestor 9. Type 2 diabetes mellitus on sliding scale insulin 10. Upper respiratory tract infection start oral doxycycline 11. Hyperlipidemia unspecified on Crestor 12. Small amount of blood with bowel movement this morning.  Continue to watch closely while on anticoagulation. 13. With epistaxis need to keep a close eye on hemoglobin and if she really starts bleeding may have to stop heparin drip.  Code Status:     Code Status Orders  (From admission, onward)        Start     Ordered   09/30/17 1901  Full code  Continuous     09/30/17 1901    Code Status History    Date Active Date Inactive Code Status Order ID Comments User Context   08/16/2017 9021 08/28/2017 2239 Full Code [id number]  Gorden Harms, MD Inpatient   03/30/2015 2152 04/03/2015 2122 Full Code [id number]  Etta Quill, DO Inpatient   03/27/2015 2208 03/28/2015 1737 Full Code [id number]  Nicholes Mango, MD ED   01/09/2014 1418 01/10/2014 1803 Full Code [id number]  Deboraha Sprang, MD Inpatient   01/16/2013 2335 01/27/2013 2011 Full Code [id number]  Collene Gobble, MD Inpatient     Disposition Plan:  creatinine will need to improve prior to disposition.  Consultants:  Cardiology  Time spent: 26 minutes  [place]

## 2017-10-07 NOTE — Progress Notes (Signed)
Post HD assessment    10/07/17 1210  Neurological  Level of Consciousness Alert  Orientation Level Oriented X4  Respiratory  Respiratory Pattern Regular;Dyspnea with exertion;Dyspnea at rest  Chest Assessment Chest expansion symmetrical  Cough Non-productive  Cardiac  Pulse Irregular  ECG Monitor Yes  Cardiac Rhythm NSR  Antiarrhythmic device Yes  Antiarrhythmic device  Antiarrhythmic device Permanent Pacemaker  Vascular  R Radial Pulse +2  L Radial Pulse +2  Integumentary  Integumentary (WDL) X  Skin Color Appropriate for ethnicity  Musculoskeletal  Musculoskeletal (WDL) X  Generalized Weakness Yes  Assistive Device None  GU Assessment  Genitourinary (WDL) X  Genitourinary Symptoms  (HD)  Psychosocial  Psychosocial (WDL) WDL

## 2017-10-07 NOTE — Progress Notes (Signed)
Post HD assessment. Pt tolerated tx well without c/o or complications. Pt had a change in rhythm to AFIB during tx, MD aware. Pt currently NSR. Net UF -485, goal not met.    10/07/17 1205  Vital Signs  Temp 98.5 F (36.9 C)  Temp Source Oral  Pulse Rate 60  Pulse Rate Source Monitor  Resp (!) 21  BP (!) 100/58  BP Location Right Arm  BP Method Automatic  Patient Position (if appropriate) Lying  Oxygen Therapy  SpO2 100 %  O2 Device Nasal Cannula  O2 Flow Rate (L/min) 3 L/min  Dialysis Weight  Weight 113 kg (249 lb 1.9 oz)  Type of Weight Post-Dialysis  Post-Hemodialysis Assessment  Rinseback Volume (mL) 250 mL  KECN 29.7 V  Dialyzer Clearance Lightly streaked  Duration of HD Treatment -hour(s) 2 hour(s)  Hemodialysis Intake (mL) 500 mL  UF Total -Machine (mL) 15 mL  Net UF (mL) -485 mL  Tolerated HD Treatment Yes  Education / Care Plan  Dialysis Education Provided Yes  Documented Education in Care Plan Yes  Hemodialysis Catheter Right Femoral vein Double-lumen  Placement Date/Time: 10/06/17 1749   Placed prior to admission: No  Local Anesthetic: None  Orientation: Right  Access Location: Femoral vein  Hemodialysis Catheter Type: Double-lumen  Site Condition No complications  Blue Lumen Status Heparin locked  Red Lumen Status Heparin locked  Catheter fill solution Heparin 1000 units/ml  Catheter fill volume (Arterial) 1.8 cc  Catheter fill volume (Venous) 1.8  Dressing Type Biopatch  Dressing Status Clean;Dry;Intact  Drainage Description None  Post treatment catheter status Capped and Clamped

## 2017-10-07 NOTE — Progress Notes (Signed)
[facility]  ROUNDING NOTE   Subjective:  Patient underwent first hemodialysis treatment yesterday which she tolerated well. Urine output increased over the past 24 hours to 920 cc. She is seen and evaluated during her second dialysis treatment.  Objective:  Vital signs in last 24 hours:  Temp:  [97.2 F (36.2 C)-98.6 F (37 C)] 98.5 F (36.9 C) (04/17 1205) Pulse Rate:  [56-64] 60 (04/17 1205) Resp:  [14-23] 21 (04/17 1205) BP: (90-133)/(43-78) 100/58 (04/17 1205) SpO2:  [94 %-100 %] 100 % (04/17 1205) Weight:  [110 kg (242 lb 8.1 oz)-113 kg (249 lb 1.9 oz)] 113 kg (249 lb 1.9 oz) (04/17 1205)  Weight change: 0 kg (0 lb) Filed Weights   10/07/17 0443 10/07/17 0945 10/07/17 1205  Weight: 111.3 kg (245 lb 4.8 oz) 112.8 kg (248 lb 10.9 oz) 113 kg (249 lb 1.9 oz)    Intake/Output: I/O last 3 completed shifts: In: 580.7 [I.V.:580.7] Out: 1020 [Urine:1020]   Intake/Output this shift:  Total I/O In: -  Out: -485   Physical Exam: General: No acute distress  Head: Normocephalic, atraumatic. Moist oral mucosal membranes  Eyes: Anicteric  Neck: Supple, trachea midline  Lungs:  Clear to auscultation, normal effort  Heart: S1S2 no rubs  Abdomen:  Soft, nontender, bowel sounds present  Extremities: 1+ peripheral edema.  Neurologic: Awake, alert, following commands  Skin: No lesions       Basic Metabolic Panel: Recent Labs  Lab 09/30/17 1924 10/01/17 0448  10/03/17 0449 10/04/17 0552 10/05/17 0748 10/06/17 0020 10/06/17 1012 10/07/17 0608  NA  --  141   < > 138 132* 131* 125*  --  135  K  --  2.9*   < > 2.9* 5.2* 4.7 5.6*  --  4.5  CL  --  103   < > 92* 92* 91* 88*  --  98*  CO2  --  32   < > 36* 32 28 26  --  28  GLUCOSE  --  116*   < > 139* 315* 236* 381*  --  128*  BUN  --  26*   < > 34* 52* 71* 83*  --  76*  CREATININE  --  1.26*   < > 1.44* 3.10* 4.13* 4.65*  --  3.76*  CALCIUM  --  9.1   < > 8.7* 8.3* 7.9* 7.8*  --  8.3*  MG 1.3* 1.6*  --   2.2  --   --   --   --   --   PHOS  --   --   --   --   --   --   --  5.5* 5.3*   < > = values in this interval not displayed.    Liver Function Tests: No results for input(s): AST, ALT, ALKPHOS, BILITOT, PROT, ALBUMIN in the last 168 hours. No results for input(s): LIPASE, AMYLASE in the last 168 hours. No results for input(s): AMMONIA in the last 168 hours.  CBC: Recent Labs  Lab 09/30/17 1545 10/03/17 0449 10/06/17 1012 10/07/17 0608  WBC 7.8 6.5  --  7.9  HGB 10.3* 9.8* 9.7* 9.7*  HCT 32.7* 30.5*  --  30.2*  MCV 85.1 85.1  --  84.7  PLT 175 174  --  192    Cardiac Enzymes: Recent Labs  Lab 09/30/17 1545  TROPONINI 0.04*    BNP: Invalid input(s): POCBNP  CBG: Recent Labs  Lab 10/06/17 0753 10/06/17 1125 10/06/17 1735  10/06/17 2101 10/07/17 0744  GLUCAP 289* 225* 278* 264* 121*    Microbiology: Results for orders placed or performed during the hospital encounter of 09/30/17  MRSA PCR Screening     Status: Abnormal   Collection Time: 10/01/17  9:37 AM  Result Value Ref Range Status   MRSA by PCR POSITIVE (A) NEGATIVE Final    Comment:        [name] (FDA approved for NASAL specimens only), is one component of a comprehensive MRSA colonization surveillance program. It is not intended to diagnose MRSA infection nor to guide or monitor treatment for MRSA infections. RESULT CALLED TO, READ BACK BY AND VERIFIED WITH:  Gildardo Pounds AT 1207 10/01/17 SDR Performed at [facility], [address] [place], [place] [address]     Coagulation Studies: Recent Labs    10/05/17 0925  LABPROT 17.6*  INR 1.46    Urinalysis: No results for input(s): COLORURINE, LABSPEC, PHURINE, GLUCOSEU, HGBUR, BILIRUBINUR, KETONESUR, PROTEINUR, UROBILINOGEN, NITRITE, LEUKOCYTESUR in the last 72 hours.  Invalid input(s): APPERANCEUR    Imaging: No results found.   Medications:   . sodium chloride    . sodium chloride 50 mL/hr at  10/06/17 2350  . heparin 950 Units/hr (10/07/17 0906)   . albuterol  2.5 mg Nebulization Q6H  . amiodarone  400 mg Oral BID  . aspirin EC  81 mg Oral Daily  . budesonide (PULMICORT) nebulizer solution  0.5 mg Nebulization BID  . doxycycline  100 mg Oral Q12H  . gabapentin  200 mg Oral TID  . insulin aspart  0-20 Units Subcutaneous TID WC  . insulin aspart  0-5 Units Subcutaneous QHS  . mupirocin ointment   Nasal BID  . predniSONE  10 mg Oral Q breakfast  . protein supplement shake  11 oz Oral BID BM  . rosuvastatin  10 mg Oral Daily  . sildenafil  20 mg Oral TID  . sodium chloride flush  3 mL Intravenous Q12H   sodium chloride, acetaminophen, ALPRAZolam, menthol-cetylpyridinium, ondansetron (ZOFRAN) IV, ondansetron, oxyCODONE-acetaminophen, promethazine, sodium chloride flush  Assessment/ Plan:  72 y.o. female with a PMHx of chronic diastolic heart failure, atrial fibrillation, chronic hypercarbic respiratory failure, COPD, morbid obesity, hyperlipidemia, diabetes mellitus type 2, chronic kidney disease stage II, history of intracranial hemorrhage who was admitted to [facility] on 09/30/2017 for evaluation of shortness of breath.   1.  Acute renal failure/chronic kidney disease stage II.  Acute renal failure appears to be multifactorial.  Contributions from over diuresis as well as relative hypotension.   -Urine output has increased to 920 cc over the preceding 24 hours.  BUN currently 76 with a creatinine of 3.7.  She is seen and evaluated during second dialysis treatment today.  We plan to complete the treatment today and hold off on any further dialysis.  2.  Anemia of chronic kidney disease.    Hemoglobin stable at 9.7.  Continue to monitor.  3.  Hyperkalemia.  Potassium down to 4.5 today with dialysis.  Continue to periodically monitor.  4.  Hyponatremia.  Serum sodium up to 135.  Continue to monitor serum sodium over the course of the hospitalization.     LOS: 7 [place] Kirlin,  Azlynn Mitnick 4/17/201912:23 PM

## 2017-10-07 NOTE — Progress Notes (Signed)
Pre HD assessment    10/07/17 0945  Neurological  Level of Consciousness Alert  Orientation Level Oriented X4  Respiratory  Respiratory Pattern Regular;Dyspnea with exertion;Dyspnea at rest  Chest Assessment Chest expansion symmetrical  Cough Non-productive  Cardiac  Pulse Irregular  ECG Monitor Yes  Antiarrhythmic device Yes  Antiarrhythmic device  Antiarrhythmic device Permanent Pacemaker  Vascular  R Radial Pulse +2  L Radial Pulse +2  Integumentary  Integumentary (WDL) X  Skin Color Appropriate for ethnicity  Musculoskeletal  Musculoskeletal (WDL) X  Generalized Weakness Yes  Assistive Device None  GU Assessment  Genitourinary (WDL) X  Genitourinary Symptoms  (HD)  Psychosocial  Psychosocial (WDL) WDL

## 2017-10-07 NOTE — Progress Notes (Addendum)
[place] for heparin Indication: atrial fibrillation  Allergies  Allergen Reactions  . Aspirin Hives and Other (See Comments)    Pt states that she is unable to take because she had bleeding in her brain.    . Other Other (See Comments)    Pt states that she is unable to take blood thinners because she had bleeding in her brain.  Blood Thinners-per doctor at [facility]  . Penicillins Hives, Shortness Of Breath, Swelling and Other (See Comments)    Has patient had a PCN reaction causing immediate rash, facial/tongue/throat swelling, SOB or lightheadedness with hypotension: Yes Has patient had a PCN reaction causing severe rash involving mucus membranes or skin necrosis: No Has patient had a PCN reaction that required hospitalization No Has patient had a PCN reaction occurring within the last 10 years: No If all of the above answers are "NO", then may proceed with Cephalosporin use.    Patient Measurements: Height: 5\' 1"  (154.9 cm) Weight: 249 lb 1.9 oz (113 kg) IBW/kg (Calculated) : 47.8 Heparin Dosing Weight: 74 kg  Vital Signs: Temp: 98.5 F (36.9 C) (04/17 1626) Temp Source: Oral (04/17 1626) BP: 105/59 (04/17 1626) Pulse Rate: 61 (04/17 1626)  Labs: Recent Labs    10/05/17 0748  10/05/17 0925 10/06/17 0020 10/06/17 1012 10/07/17 0608 10/07/17 1611  HGB  --   --   --   --  9.7* 9.7*  --   HCT  --   --   --   --   --  30.2*  --   PLT  --   --   --   --   --  192  --   APTT  --    < > 32 68*  --  122* 97*  LABPROT  --   --  17.6*  --   --   --   --   INR  --   --  1.46  --   --   --   --   HEPARINUNFRC  --   --  >3.60* 2.54*  --  1.53*  --   CREATININE 4.13*  --   --  4.65*  --  3.76*  --    < > = values in this interval not displayed.    Estimated Creatinine Clearance: 15.3 mL/min (A) (by C-G formula based on SCr of 3.76 mg/dL (H)).   Medical History: Past Medical History:  Diagnosis Date  . Arthritis   . Atrial  fibrillation, persistent ([place])    a. s/p TEE-guided DCCV on 08/20/2017  . Chest pain    a. 01/2012 Cath: nonobs dzs;  c. 04/2014 MV: No ischemia.  . Chronic back pain   . Chronic combined systolic (congestive) and diastolic (congestive) heart failure ([place])    a. Dx 2005 @ [facility];  b. 02/2014 Echo: Ef 55-60%, no rwma, mild MR, PASP 62mmHg. c. 07/2017: EF 30-35%, diffuse HK, trivial AI, severe MR, moderate TR  . Chronic respiratory failure (HCC)    a. on home O2  . COPD (chronic obstructive pulmonary disease) ([place])    a. Home O2 - 3lpm  . Gastritis   . History of intracranial hemorrhage    a. 6/14 described by neurosurgery as "small frontal posttraumatic SAH " - pt was on xarelto @ the time.  . Hyperlipidemia   . Hypertension   . Lipoma of back   . Lymphedema    a. Chronic LLE edema.  [name]  Obesity   . Sepsis with metabolic encephalopathy ([place])   . Sleep apnea   . Streptococcal bacteremia   . Stroke ([place])   . Symptomatic bradycardia    a. s/p BSX PPM.  . Thyroid nodule   . Type II diabetes mellitus ([place])   . Urinary incontinence     Medications:  Patient takes apixaban 5mg  BID at home for afib. She was initially started on apixaban 5mg  BID when she was admitted, but her dose was changed to apixaban 2.5mg  BID on 4/14 due to her increase in Scr (1.4 --> 3.1). Cardiology stated that decrease in apixaban dose was for instability of patient's renal function.   Assessment: Patient is a 72 yo female admitted with exacerbation of CHF in the setting of persistent afib.  As above, patient was on apixaban 2.5mg  BID due to unstable renal function. Per cardiology, as renal function continues to decline (Scr 4.13 on 4/15) wish to transition patient from apixaban to heparin. Pharmacy has been consulted to dose and manage heparin. Per cardiology, will start heparin 8 hours after last dose of apixaban.   Goal of Therapy:  Heparin level 0.3-0.7 units/ml  APTT 66-102s Monitor platelets by anticoagulation  protocol: Yes   Plan:  4/17 APTT elevated at 122. Will reduced heparin drip rate from 1100 units/hr to 950 units/hr. RN made aware. No reports of any further blood in stool Hgb is stable. Recheck APTT in 8 hours. APPT and HL not coralating yet.  4/17 16:11 aPTT therapeutic x 1. Continue current rate. Will recheck aPTT in 6 hours. Continue HL daily until levels correlate with aPTT. CBC daily per protocol.  Nathan A. [place], [place].D, BCPS Clinical Pharmacist 10/07/17 4:36 PM   4/17 PM aPTT 77. Continue current regimen. Recheck aPTT, heparin level, and CBC with AM labs.  Sim Boast, PharmD, BCPS  10/07/17 10:59 PM

## 2017-10-07 NOTE — Progress Notes (Signed)
HD tx end   10/07/17 1157  Vital Signs  Pulse Rate 60  Pulse Rate Source Monitor  Resp (!) 23  BP 90/60  BP Location Right Arm  BP Method Automatic  Patient Position (if appropriate) Lying  Oxygen Therapy  SpO2 98 %  O2 Device Nasal Cannula  O2 Flow Rate (L/min) 3 L/min  During Hemodialysis Assessment  Dialysis Fluid Bolus Normal Saline  Bolus Amount (mL) 250 mL  Intra-Hemodialysis Comments Tx completed

## 2017-10-08 ENCOUNTER — Inpatient Hospital Stay: Payer: Medicare HMO

## 2017-10-08 DIAGNOSIS — N179 Acute kidney failure, unspecified: Secondary | ICD-10-CM

## 2017-10-08 DIAGNOSIS — R079 Chest pain, unspecified: Secondary | ICD-10-CM

## 2017-10-08 DIAGNOSIS — R0602 Shortness of breath: Secondary | ICD-10-CM

## 2017-10-08 DIAGNOSIS — I5021 Acute systolic (congestive) heart failure: Secondary | ICD-10-CM

## 2017-10-08 DIAGNOSIS — N186 End stage renal disease: Secondary | ICD-10-CM

## 2017-10-08 DIAGNOSIS — Z992 Dependence on renal dialysis: Secondary | ICD-10-CM

## 2017-10-08 LAB — HEPARIN LEVEL (UNFRACTIONATED): Heparin Unfractionated: 0.77 IU/mL — ABNORMAL HIGH (ref 0.30–0.70)

## 2017-10-08 LAB — GLUCOSE, CAPILLARY
Glucose-Capillary: 148 mg/dL — ABNORMAL HIGH (ref 65–99)
Glucose-Capillary: 183 mg/dL — ABNORMAL HIGH (ref 65–99)
Glucose-Capillary: 181 mg/dL — ABNORMAL HIGH (ref 65–99)
Glucose-Capillary: 192 mg/dL — ABNORMAL HIGH (ref 65–99)
Glucose-Capillary: 181 mg/dL — ABNORMAL HIGH (ref 65–99)

## 2017-10-08 LAB — CBC
HCT: 28.9 % — ABNORMAL LOW (ref 35.0–47.0)
Hemoglobin: 9.2 g/dL — ABNORMAL LOW (ref 12.0–16.0)
MCH: 27.4 pg (ref 26.0–34.0)
MCHC: 32 g/dL (ref 32.0–36.0)
MCV: 85.5 fL (ref 80.0–100.0)
Platelets: 199 10*3/uL (ref 150–440)
RBC: 3.38 MIL/uL — ABNORMAL LOW (ref 3.80–5.20)
RDW: 16.8 % — ABNORMAL HIGH (ref 11.5–14.5)
WBC: 7.6 10*3/uL (ref 3.6–11.0)

## 2017-10-08 LAB — APTT: aPTT: 84 seconds — ABNORMAL HIGH (ref 24–36)

## 2017-10-08 MED ORDER — FUROSEMIDE 10 MG/ML IJ SOLN
INTRAMUSCULAR | Status: AC
  Filled 2017-10-08: qty 8

## 2017-10-08 MED ORDER — ADULT MULTIVITAMIN W/MINERALS CH
1.0000 | ORAL_TABLET | Freq: Every day | ORAL | Status: DC
  Administered 2017-10-08 – 2017-10-22 (×14): 1 via ORAL
  Filled 2017-10-08 (×16): qty 1

## 2017-10-08 MED ORDER — NITROGLYCERIN 0.4 MG SL SUBL
SUBLINGUAL_TABLET | SUBLINGUAL | Status: AC
Start: 2017-10-08 — End: 2017-10-08
  Filled 2017-10-08: qty 1

## 2017-10-08 MED ORDER — CHLORHEXIDINE GLUCONATE 0.12 % MT SOLN
15.0000 mL | Freq: Two times a day (BID) | OROMUCOSAL | Status: DC
  Administered 2017-10-08 – 2017-10-22 (×25): 15 mL via OROMUCOSAL
  Filled 2017-10-08 (×25): qty 15

## 2017-10-08 MED ORDER — FUROSEMIDE 10 MG/ML IJ SOLN
80.0000 mg | Freq: Once | INTRAMUSCULAR | Status: AC
  Administered 2017-10-08: 80 mg via INTRAVENOUS

## 2017-10-08 MED ORDER — METHYLPREDNISOLONE SODIUM SUCC 40 MG IJ SOLR
40.0000 mg | Freq: Two times a day (BID) | INTRAMUSCULAR | Status: DC
  Administered 2017-10-08 – 2017-10-15 (×14): 40 mg via INTRAVENOUS
  Filled 2017-10-08 (×14): qty 1

## 2017-10-08 MED ORDER — BOOST / RESOURCE BREEZE PO LIQD CUSTOM
1.0000 | Freq: Two times a day (BID) | ORAL | Status: DC
  Administered 2017-10-11 (×2): 1 via ORAL

## 2017-10-08 MED ORDER — NITROGLYCERIN 0.4 MG SL SUBL
0.4000 mg | SUBLINGUAL_TABLET | SUBLINGUAL | Status: DC | PRN
  Administered 2017-10-08: 0.4 mg via SUBLINGUAL

## 2017-10-08 MED ORDER — ORAL CARE MOUTH RINSE
15.0000 mL | Freq: Two times a day (BID) | OROMUCOSAL | Status: DC
  Administered 2017-10-10 – 2017-10-22 (×9): 15 mL via OROMUCOSAL

## 2017-10-08 NOTE — Progress Notes (Signed)
[place] for heparin Indication: atrial fibrillation  Allergies  Allergen Reactions  . Aspirin Hives and Other (See Comments)    Pt states that she is unable to take because she had bleeding in her brain.    . Other Other (See Comments)    Pt states that she is unable to take blood thinners because she had bleeding in her brain.  Blood Thinners-per doctor at [facility]  . Penicillins Hives, Shortness Of Breath, Swelling and Other (See Comments)    Has patient had a PCN reaction causing immediate rash, facial/tongue/throat swelling, SOB or lightheadedness with hypotension: Yes Has patient had a PCN reaction causing severe rash involving mucus membranes or skin necrosis: No Has patient had a PCN reaction that required hospitalization No Has patient had a PCN reaction occurring within the last 10 years: No If all of the above answers are "NO", then may proceed with Cephalosporin use.    Patient Measurements: Height: 5\' 1"  (154.9 cm) Weight: 242 lb 4.8 oz (109.9 kg) IBW/kg (Calculated) : 47.8 Heparin Dosing Weight: 74 kg  Vital Signs: Temp: 97.9 F (36.6 C) (04/18 0335) Temp Source: Oral (04/18 0335) BP: 131/73 (04/18 0335) Pulse Rate: 60 (04/18 0335)  Labs: Recent Labs    10/05/17 0748  10/05/17 0925 10/06/17 0020  10/07/17 2703 10/07/17 1611 10/07/17 2235 10/08/17 0355  HGB  --   --   --   --    < > 9.7*  --   --  9.2*  HCT  --   --   --   --   --  30.2*  --   --  28.9*  PLT  --   --   --   --   --  192  --   --  199  APTT  --    < > 32 68*  --  122* 97* 77* 84*  LABPROT  --   --  17.6*  --   --   --   --   --   --   INR  --   --  1.46  --   --   --   --   --   --   HEPARINUNFRC  --    < > >3.60* 2.54*  --  1.53*  --   --  0.77*  CREATININE 4.13*  --   --  4.65*  --  3.76*  --   --   --    < > = values in this interval not displayed.    Estimated Creatinine Clearance: 15 mL/min (A) (by C-G formula based on SCr of 3.76 mg/dL  (H)).   Medical History: Past Medical History:  Diagnosis Date  . Arthritis   . Atrial fibrillation, persistent ([place])    a. s/p TEE-guided DCCV on 08/20/2017  . Chest pain    a. 01/2012 Cath: nonobs dzs;  c. 04/2014 MV: No ischemia.  . Chronic back pain   . Chronic combined systolic (congestive) and diastolic (congestive) heart failure ([place])    a. Dx 2005 @ [facility];  b. 02/2014 Echo: Ef 55-60%, no rwma, mild MR, PASP 76mmHg. c. 07/2017: EF 30-35%, diffuse HK, trivial AI, severe MR, moderate TR  . Chronic respiratory failure (HCC)    a. on home O2  . COPD (chronic obstructive pulmonary disease) ([place])    a. Home O2 - 3lpm  . Gastritis   . History of intracranial hemorrhage    a.  6/14 described by neurosurgery as "small frontal posttraumatic SAH " - pt was on xarelto @ the time.  . Hyperlipidemia   . Hypertension   . Lipoma of back   . Lymphedema    a. Chronic LLE edema.  . Obesity   . Sepsis with metabolic encephalopathy ([place])   . Sleep apnea   . Streptococcal bacteremia   . Stroke ([place])   . Symptomatic bradycardia    a. s/p BSX PPM.  . Thyroid nodule   . Type II diabetes mellitus ([place])   . Urinary incontinence     Medications:  Patient takes apixaban 5mg  BID at home for afib. She was initially started on apixaban 5mg  BID when she was admitted, but her dose was changed to apixaban 2.5mg  BID on 4/14 due to her increase in Scr (1.4 --> 3.1). Cardiology stated that decrease in apixaban dose was for instability of patient's renal function.   Assessment: Patient is a 72 yo female admitted with exacerbation of CHF in the setting of persistent afib.  As above, patient was on apixaban 2.5mg  BID due to unstable renal function. Per cardiology, as renal function continues to decline (Scr 4.13 on 4/15) wish to transition patient from apixaban to heparin. Pharmacy has been consulted to dose and manage heparin. Per cardiology, will start heparin 8 hours after last dose of apixaban.   Goal of  Therapy:  Heparin level 0.3-0.7 units/ml  APTT 66-102s Monitor platelets by anticoagulation protocol: Yes   Plan:  4/17 APTT elevated at 122. Will reduced heparin drip rate from 1100 units/hr to 950 units/hr. RN made aware. No reports of any further blood in stool Hgb is stable. Recheck APTT in 8 hours. APPT and HL not coralating yet.  4/17 16:11 aPTT therapeutic x 1. Continue current rate. Will recheck aPTT in 6 hours. Continue HL daily until levels correlate with aPTT. CBC daily per protocol.  Nathan A. [place], [place].D, BCPS Clinical Pharmacist 10/08/17 5:08 AM   4/17 PM aPTT 77. Continue current regimen. Recheck aPTT, heparin level, and CBC with AM labs.  4/18 AM aPTT 84, heparin level 0.77. Continue current regimen. Recheck aPTT, heparin level, and CBC with tomorrow AM labs.  Sim Boast, PharmD, BCPS  10/08/17 5:08 AM

## 2017-10-08 NOTE — Progress Notes (Signed)
   CHIEF COMPLAINT:   Chief Complaint  Patient presents with  . Shortness of Breath    Subjective    72 yo obese female with advanced cardio-renal syndrome  Acute on chronic combined systolic and diastolic heart failure: Unfortunately, diuresis was complicated by hypotension with subsequent ATN and acute renal failure that required dialysis.  Renal function is now improving with good urine output.  Was admitted and brief bout of HD now transferred to ICU for progressive resp failure    Review of Systems: ,+edema Resp: +SOB   PATIENT IS UNABLE TO PROVIDE COMPLETE REVIEW OF SYSTEM S DUE TO SEVERE CRITICAL ILLNESS       Objective   Examination:  General exam: +resp distress +biPAP Respiratory system: +rhonchi HEENT: [place]/AT, PERRLA, no thrush, no stridor. Cardiovascular system: S1 & S2 heard, RRR. No JVD, murmurs, rubs, gallops or clicks. No pedal edema. Gastrointestinal system: Abdomen is nondistended, soft and nontender. No organomegaly or masses felt. Normal bowel sounds heard. Central nervous system:lethargic. Extremities: +edema Skin: No rashes, lesions or ulcers   VITALS:  height is 5\' 1"  (1.549 m) and weight is 242 lb 4.8 oz (109.9 kg). Her oral temperature is 98.1 F (36.7 C). Her blood pressure is 157/95 (abnormal) and her pulse is 84. Her respiration is 16 and oxygen saturation is 97%.   I personally reviewed Labs under Results section.  Radiology Reports CXR pending     Assessment/Plan:  72 yo obese female with acute and progressive cardio-renal syndrome with severe resp distress on biPAP High risk for intubation  1.continue biPAP 2.oxygen as needed 3.nehrology consult-?need for HD 4.follow up cardiology recs   Critical Care Time devoted to patient care services described in this note is 32 minutes.   Overall, patient is critically ill, prognosis is guarded.  Patient with Multiorgan failure and at high risk for cardiac arrest and death.     Corrin Parker, M.D.  [facility]  Medical Director [place] Director [facility] Cardio-Pulmonary Department

## 2017-10-08 NOTE — Progress Notes (Signed)
Patient ID: Debra Saunders, female   DOB: October 14, 1945, 72 y.o.   MRN: [id number]  [facility] PROGRESS NOTE  AVEA MCGOWEN CHE:[id number] DOB: April 14, 1946 DOA: 09/30/2017 PCP: [name], MD  HPI/Subjective: Patient had a rough night with headache.  She was given a pain medication last night but was too strong for her.  Feeling a little bit better now.  Objective: Vitals:   10/08/17 0335 10/08/17 0746  BP: 131/73 133/74  Pulse: 60 (!) 59  Resp: 17 16  Temp: 97.9 F (36.6 C) 98.1 F (36.7 C)  SpO2: 99% 100%    Filed Weights   10/07/17 0945 10/07/17 1205 10/08/17 0335  Weight: 112.8 kg (248 lb 10.9 oz) 113 kg (249 lb 1.9 oz) 109.9 kg (242 lb 4.8 oz)    ROS: Review of Systems  Constitutional: Negative for chills and fever.  Eyes: Negative for blurred vision.  Respiratory: Positive for shortness of breath. Negative for cough and wheezing.   Cardiovascular: Negative for chest pain.  Gastrointestinal: Negative for abdominal pain, constipation, diarrhea, nausea and vomiting.  Genitourinary: Negative for dysuria.  Musculoskeletal: Negative for joint pain.  Neurological: Positive for headaches. Negative for dizziness.   Exam: Physical Exam  Constitutional: She is oriented to person, place, and time.  HENT:  Nose: No mucosal edema.  Mouth/Throat: No oropharyngeal exudate.  Bleeding from right nostril.  Eyes: Pupils are equal, round, and reactive to light. Conjunctivae, EOM and lids are normal.  Neck: No JVD present. Carotid bruit is not present. No edema present. No thyroid mass and no thyromegaly present.  Cardiovascular: S1 normal and S2 normal. An irregularly irregular rhythm present. Exam reveals no gallop.  No murmur heard. Pulses:      Dorsalis pedis pulses are 2+ on the right side, and 2+ on the left side.  Respiratory: No respiratory distress. She has decreased breath sounds in the right lower field and the left lower field. She has no wheezes. She has no  rhonchi. She has no rales.  GI: Soft. Bowel sounds are normal. There is no tenderness.  Musculoskeletal:       Right ankle: She exhibits swelling.       Left ankle: She exhibits swelling.  Lymphadenopathy:    She has no cervical adenopathy.  Neurological: She is alert and oriented to person, place, and time. No cranial nerve deficit.  Skin: Skin is warm. No rash noted. Nails show no clubbing.  Psychiatric: She has a normal mood and affect.      Data Reviewed: Basic Metabolic Panel: Recent Labs  Lab 10/03/17 0449 10/04/17 0552 10/05/17 0748 10/06/17 0020 10/06/17 1012 10/07/17 0608  NA 138 132* 131* 125*  --  135  K 2.9* 5.2* 4.7 5.6*  --  4.5  CL 92* 92* 91* 88*  --  98*  CO2 36* 32 28 26  --  28  GLUCOSE 139* 315* 236* 381*  --  128*  BUN 34* 52* 71* 83*  --  76*  CREATININE 1.44* 3.10* 4.13* 4.65*  --  3.76*  CALCIUM 8.7* 8.3* 7.9* 7.8*  --  8.3*  MG 2.2  --   --   --   --   --   PHOS  --   --   --   --  5.5* 5.3*   Acute kidney injury CBC: Recent Labs  Lab 10/03/17 0449 10/06/17 1012 10/07/17 0608 10/08/17 0355  WBC 6.5  --  7.9 7.6  HGB 9.8* 9.7* 9.7* 9.2*  HCT 30.5*  --  30.2* 28.9*  MCV 85.1  --  84.7 85.5  PLT 174  --  192 199   BNP (last 3 results) Recent Labs    08/04/17 1552 08/16/17 0925 09/30/17 1545  BNP 175.0* 655.0* 1,151.0*     CBG: Recent Labs  Lab 10/07/17 1234 10/07/17 1627 10/07/17 2055 10/08/17 0745 10/08/17 1158  GLUCAP 134* 232* 185* 148* 183*    Recent Results (from the past 240 hour(s))  MRSA PCR Screening     Status: Abnormal   Collection Time: 10/01/17  9:37 AM  Result Value Ref Range Status   MRSA by PCR POSITIVE (A) NEGATIVE Final    Comment:        [name] (FDA approved for NASAL specimens only), is one component of a comprehensive MRSA colonization surveillance program. It is not intended to diagnose MRSA infection nor to guide or monitor treatment for MRSA infections. RESULT CALLED TO,  READ BACK BY AND VERIFIED WITH:  Gildardo Pounds AT 1207 10/01/17 SDR Performed at [facility], [address] [place], [place] [address]       Scheduled Meds: . amiodarone  400 mg Oral BID  . budesonide (PULMICORT) nebulizer solution  0.5 mg Nebulization BID  . doxycycline  100 mg Oral Q12H  . gabapentin  300 mg Oral QHS  . insulin aspart  0-5 Units Subcutaneous QHS  . insulin aspart  0-9 Units Subcutaneous TID WC  . ipratropium-albuterol  3 mL Nebulization TID  . mupirocin ointment   Nasal BID  . predniSONE  10 mg Oral Q breakfast  . protein supplement shake  11 oz Oral BID BM  . rosuvastatin  10 mg Oral Daily  . sildenafil  20 mg Oral TID  . sodium chloride flush  3 mL Intravenous Q12H   Continuous Infusions: . sodium chloride    . heparin 950 Units/hr (10/08/17 0824)    Assessment/Plan:  1. Acute kidney injury on CKD stage 3.  Likely over diuresis and hypotension .  I will hold Lasix, Spironolactone and losartan.  Nephrology did dialysis yesterday second session.  Blood pressure is responded and the patient is now making urine. 2. Relative hypotension.  Continue medications. 3. Acute on chronic diastolic congestive heart failure.  Patient has moderate to severe mitral valve regurgitation.   Holding all medications secondary to hypotension and acute kidney injury.  Watch closely with gentle IV fluids. 4. Chronic atrial fibrillation on heparin drip and amiodarone.  Holding Toprol. 5. Chronic hypoxic hypercarbic respiratory failure.  Chronically on 4 L of oxygen. 6. Morbid obesity.  Weight loss needed 7. COPD exacerbation, continue nebulizer treatments.   taper prednisone to off.  Since lungs sound better this could be more COPD rather than heart failure.  8. Hyperlipidemia unspecified on Crestor 9. Type 2 diabetes mellitus on sliding scale insulin 10. Upper respiratory tract infection on oral doxycycline 11. Hyperlipidemia unspecified on Crestor 12. No further  epistaxsis 13. No further rectal bleeding  Code Status:     Code Status Orders  (From admission, onward)        Start     Ordered   09/30/17 1901  Full code  Continuous     09/30/17 1901    Code Status History    Date Active Date Inactive Code Status Order ID Comments User Context   08/16/2017 [id number] 08/28/2017 2239 Full Code [id number]  Gorden Harms, MD Inpatient   03/30/2015 2152 04/03/2015 2122 Full Code  [id number]  Etta Quill, DO Inpatient   03/27/2015 2208 03/28/2015 1737 Full Code [id number]  Nicholes Mango, MD ED   01/09/2014 1418 01/10/2014 1803 Full Code [id number]  Deboraha Sprang, MD Inpatient   01/16/2013 2335 01/27/2013 2011 Full Code [id number]  Collene Gobble, MD Inpatient     Disposition Plan:  creatinine will need to improve prior to disposition.  Consultants:  Cardiology  Time spent: 27 minutes  [place]

## 2017-10-08 NOTE — Progress Notes (Signed)
Progress Note  Patient Name: Debra Saunders Date of Encounter: 10/08/2017  Primary Cardiologist: [name], MD  Subjective   Has headache this AM.  It started last night and she was given percocet.  She says that she's been feeling somewhat nauseated and out of sorts since.  Breathing somewhat better.  No chest pain.  Remains in AF w/ V pacing.  Inpatient Medications    Scheduled Meds: . amiodarone  400 mg Oral BID  . budesonide (PULMICORT) nebulizer solution  0.5 mg Nebulization BID  . doxycycline  100 mg Oral Q12H  . gabapentin  300 mg Oral QHS  . insulin aspart  0-5 Units Subcutaneous QHS  . insulin aspart  0-9 Units Subcutaneous TID WC  . ipratropium-albuterol  3 mL Nebulization TID  . mupirocin ointment   Nasal BID  . predniSONE  10 mg Oral Q breakfast  . protein supplement shake  11 oz Oral BID BM  . rosuvastatin  10 mg Oral Daily  . sildenafil  20 mg Oral TID  . sodium chloride flush  3 mL Intravenous Q12H   Continuous Infusions: . sodium chloride    . heparin 950 Units/hr (10/08/17 0824)   PRN Meds: sodium chloride, acetaminophen, ALPRAZolam, ipratropium-albuterol, menthol-cetylpyridinium, ondansetron (ZOFRAN) IV, ondansetron, oxyCODONE-acetaminophen, promethazine, sodium chloride flush   Vital Signs    Vitals:   10/07/17 1915 10/07/17 2039 10/08/17 0335 10/08/17 0746  BP: (!) 100/54  131/73 133/74  Pulse: 62 60 60 (!) 59  Resp: 18 18 17 16   Temp: 98.4 F (36.9 C)  97.9 F (36.6 C) 98.1 F (36.7 C)  TempSrc: Oral  Oral Oral  SpO2: 99% 98% 99% 100%  Weight:   242 lb 4.8 oz (109.9 kg)   Height:        Intake/Output Summary (Last 24 hours) at 10/08/2017 1034 Last data filed at 10/08/2017 0300 Gross per 24 hour  Intake -  Output 1865 ml  Net -1865 ml   Filed Weights   10/07/17 0945 10/07/17 1205 10/08/17 0335  Weight: 248 lb 10.9 oz (112.8 kg) 249 lb 1.9 oz (113 kg) 242 lb 4.8 oz (109.9 kg)    Physical Exam   GEN: Well nourished, well  developed, in no acute distress.  HEENT: Grossly normal.  Neck: Supple, obese, difficult to gauge jvp. Cardiac: IR, IR, no murmurs, rubs, or gallops. No clubbing, cyanosis.  Trace RLE edema, 1+ LLE edema.  Radials/DP/PT 1+ and equal bilaterally.  Respiratory:  Respirations regular and unlabored, clear to auscultation bilaterally. GI: Obese, Soft, nontender, nondistended, BS + x 4. MS: no deformity or atrophy. Skin: warm and dry, no rash. Neuro:  Strength and sensation are intact. Psych: AAOx3.  Normal affect.  Labs    Chemistry Recent Labs  Lab 10/05/17 0748 10/06/17 0020 10/07/17 0608  NA 131* 125* 135  K 4.7 5.6* 4.5  CL 91* 88* 98*  CO2 28 26 28   GLUCOSE 236* 381* 128*  BUN 71* 83* 76*  CREATININE 4.13* 4.65* 3.76*  CALCIUM 7.9* 7.8* 8.3*  GFRNONAA 10* 8* 11*  GFRAA 11* 10* 13*  ANIONGAP 12 11 9      Hematology Recent Labs  Lab 10/03/17 0449 10/06/17 1012 10/07/17 0608 10/08/17 0355  WBC 6.5  --  7.9 7.6  RBC 3.58*  --  3.57* 3.38*  HGB 9.8* 9.7* 9.7* 9.2*  HCT 30.5*  --  30.2* 28.9*  MCV 85.1  --  84.7 85.5  MCH 27.5  --  27.1 27.4  MCHC 32.3  --  32.1 32.0  RDW 16.5*  --  16.6* 16.8*  PLT 174  --  192 199     Radiology    No results found.  Telemetry    Afib w/ V pacing on demand - Personally Reviewed  Cardiac Studies   ECHO: 10/01/17 - Left ventricle: The cavity size was normal. There was mild concentric hypertrophy. Systolic function was normal. The estimated ejection fraction was in the range of 50% to 55%. Wall motion was normal; there were no regional wall motion abnormalities. The study is not technically sufficient to allow evaluation of LV diastolic function. - Mitral valve: There was moderate to severe regurgitation. - Left atrium: The atrium was moderately dilated. - Right ventricle: The cavity size was mildly dilated. Wall thickness was normal. Systolic function was mildly reduced. - Right atrium: The atrium was  moderately dilated. - Tricuspid valve: There was moderate regurgitation. - Pulmonary arteries: Systolic pressure was severely elevated PA peak pressure: 69 mm Hg (S).  Patient Profile     72 y.o. female with a history of nonobstructive CAD, chronic combined systolic and diastolic congestive heart failure, persistent atrial fibrilation, symptomatic bradycardia status post permanent pacemaker, frequent PVCs, chronic respiratory failure with hypoxia on home O2, COPD, hypertension, hyperlipidemia, obesity, type 2 diabetes mellitus, traumatic subarachnoid hemorrhage in the setting of Xarelto, left lower extremity DVT status post IVC filter, chronic right sided atypical chest pain, chronic left lower extremity lymphedema, prior stroke, sleep apnea, and obesity, who is being seen for the evaluation ofacute on chronic combined heart failure in the setting of persistent atrial fibrillationat the request of Dr. Posey Pronto.  Assessment & Plan    1.  Acute on chronic stage II-III renal failure:  S/p HD x 2, 4/16 & 4/17.  BUN/Creat sl better today.  Per nephrology.  2.  Acute on chronic combined systolic and diastolic CHF: Admitted with marked volume overload in the setting of AFib.  Afib rate controlled, VPacing on demand.  EF 50-55% by echo this admission.  Due to worsening renal failure, she's required HD x 2.  Last yesterday. Wt down seven lbs following HD, though ,if accurate, still well abveo admission weight.  Defer volume mgmt to nephrology.  Would still plan on eventual DCCV once clinically better.  Currently HR/BP stable.  3.  Persistent AF:  Rate controlled.  Likely contributing to CHF.  She has thus far received a 4G load of amio - will be 5.2G by the end of the day tomorrow.  Can think about dccv and amio dose reduction early next week if clinically stable.  Eliquis held in favor for heparin for now.  She has been consistently anticoagulated since admission (eliquis changed to heparin on 4/15).  4.   Mod to Sev MR:  Likely playing a role in volume issues.  Plan to f/u echo once in sinus rhythm.  5.  PAH:  Playing a role in volume issues.  Cont sildenafil.    Signed, Murray Hodgkins, NP  10/08/2017, 10:34 AM    For questions or updates, please contact   Please consult [email or web address] for contact info under Cardiology/STEMI.

## 2017-10-08 NOTE — Progress Notes (Signed)
[facility]  ROUNDING NOTE   Subjective:  Patient seen at bedside. Urine output significantly improved to 2.8 L over the preceding 24 hours. No further dialysis needed.  Objective:  Vital signs in last 24 hours:  Temp:  [97.9 F (36.6 C)-98.5 F (36.9 C)] 98.1 F (36.7 C) (04/18 0746) Pulse Rate:  [58-62] 59 (04/18 0746) Resp:  [16-21] 16 (04/18 0746) BP: (100-133)/(54-74) 133/74 (04/18 0746) SpO2:  [98 %-100 %] 100 % (04/18 0746) Weight:  [109.9 kg (242 lb 4.8 oz)-113 kg (249 lb 1.9 oz)] 109.9 kg (242 lb 4.8 oz) (04/18 0335)  Weight change: 2.8 kg (6 lb 2.8 oz) Filed Weights   10/07/17 0945 10/07/17 1205 10/08/17 0335  Weight: 112.8 kg (248 lb 10.9 oz) 113 kg (249 lb 1.9 oz) 109.9 kg (242 lb 4.8 oz)    Intake/Output: I/O last 3 completed shifts: In: -  Out: 2485 [Urine:2970]   Intake/Output this shift:  No intake/output data recorded.  Physical Exam: General: No acute distress  Head: Normocephalic, atraumatic. Moist oral mucosal membranes  Eyes: Anicteric  Neck: Supple, trachea midline  Lungs:  Clear to auscultation, normal effort  Heart: S1S2 no rubs  Abdomen:  Soft, nontender, bowel sounds present  Extremities: 1+ peripheral edema.  Neurologic: Awake, alert, following commands  Skin: No lesions       Basic Metabolic Panel: Recent Labs  Lab 10/03/17 0449 10/04/17 0552 10/05/17 0748 10/06/17 0020 10/06/17 1012 10/07/17 0608  NA 138 132* 131* 125*  --  135  K 2.9* 5.2* 4.7 5.6*  --  4.5  CL 92* 92* 91* 88*  --  98*  CO2 36* 32 28 26  --  28  GLUCOSE 139* 315* 236* 381*  --  128*  BUN 34* 52* 71* 83*  --  76*  CREATININE 1.44* 3.10* 4.13* 4.65*  --  3.76*  CALCIUM 8.7* 8.3* 7.9* 7.8*  --  8.3*  MG 2.2  --   --   --   --   --   PHOS  --   --   --   --  5.5* 5.3*    Liver Function Tests: No results for input(s): AST, ALT, ALKPHOS, BILITOT, PROT, ALBUMIN in the last 168 hours. No results for input(s): LIPASE, AMYLASE in the last 168  hours. No results for input(s): AMMONIA in the last 168 hours.  CBC: Recent Labs  Lab 10/03/17 0449 10/06/17 1012 10/07/17 0608 10/08/17 0355  WBC 6.5  --  7.9 7.6  HGB 9.8* 9.7* 9.7* 9.2*  HCT 30.5*  --  30.2* 28.9*  MCV 85.1  --  84.7 85.5  PLT 174  --  192 199    Cardiac Enzymes: No results for input(s): CKTOTAL, CKMB, CKMBINDEX, TROPONINI in the last 168 hours.  BNP: Invalid input(s): POCBNP  CBG: Recent Labs  Lab 10/07/17 0744 10/07/17 1234 10/07/17 1627 10/07/17 2055 10/08/17 0745  GLUCAP 121* 134* 232* 185* 148*    Microbiology: Results for orders placed or performed during the hospital encounter of 09/30/17  MRSA PCR Screening     Status: Abnormal   Collection Time: 10/01/17  9:37 AM  Result Value Ref Range Status   MRSA by PCR POSITIVE (A) NEGATIVE Final    Comment:        [name] (FDA approved for NASAL specimens only), is one component of a comprehensive MRSA colonization surveillance program. It is not intended to diagnose MRSA infection nor to guide or monitor treatment  for MRSA infections. RESULT CALLED TO, READ BACK BY AND VERIFIED WITH:  Gildardo Pounds AT 1207 10/01/17 SDR Performed at [facility], [address] [place], [place] [address]     Coagulation Studies: No results for input(s): LABPROT, INR in the last 72 hours.  Urinalysis: No results for input(s): COLORURINE, LABSPEC, PHURINE, GLUCOSEU, HGBUR, BILIRUBINUR, KETONESUR, PROTEINUR, UROBILINOGEN, NITRITE, LEUKOCYTESUR in the last 72 hours.  Invalid input(s): APPERANCEUR    Imaging: No results found.   Medications:   . sodium chloride    . heparin 950 Units/hr (10/08/17 0824)   . amiodarone  400 mg Oral BID  . budesonide (PULMICORT) nebulizer solution  0.5 mg Nebulization BID  . doxycycline  100 mg Oral Q12H  . gabapentin  300 mg Oral QHS  . insulin aspart  0-5 Units Subcutaneous QHS  . insulin aspart  0-9 Units Subcutaneous TID WC  .  ipratropium-albuterol  3 mL Nebulization TID  . mupirocin ointment   Nasal BID  . predniSONE  10 mg Oral Q breakfast  . protein supplement shake  11 oz Oral BID BM  . rosuvastatin  10 mg Oral Daily  . sildenafil  20 mg Oral TID  . sodium chloride flush  3 mL Intravenous Q12H   sodium chloride, acetaminophen, ALPRAZolam, ipratropium-albuterol, menthol-cetylpyridinium, ondansetron (ZOFRAN) IV, ondansetron, oxyCODONE-acetaminophen, promethazine, sodium chloride flush  Assessment/ Plan:  72 y.o. female with a PMHx of chronic diastolic heart failure, atrial fibrillation, chronic hypercarbic respiratory failure, COPD, morbid obesity, hyperlipidemia, diabetes mellitus type 2, chronic kidney disease stage II, history of intracranial hemorrhage who was admitted to [facility] on 09/30/2017 for evaluation of shortness of breath.   1.  Acute renal failure/chronic kidney disease stage II.  Acute renal failure appears to be multifactorial.  Contributions from over diuresis as well as relative hypotension.  Patient underwent dialysis x 2 sessions.  -the patient has completed 2 dialysis sessions.  Urine output up to 2.8 L over the preceding 24 hours.  Therefore no additional need for dialysis.  Discontinue temporary dialysis catheter.  2.  Anemia of chronic kidney disease.    Hemoglobin currently 9.2.  No indication for Procrit at the moment.  3.  Hyperkalemia.  No new serum potassium today but was down to 4.5 yesterday.  4.  Hyponatremia.  Improve with dialysis.  Continue to periodically monitor.     LOS: 8 Debra Saunders 4/18/201912:03 PM

## 2017-10-08 NOTE — Progress Notes (Signed)
Perm cath removed from right groin. Small amount of bleed immediately after removals.  Pressure applied from > 5 minutes.  No further bleeding.  Exclusive bandage with gauze applied.

## 2017-10-08 NOTE — Progress Notes (Signed)
PT Cancellation Note  Patient Details Name: Debra Saunders MRN: [id number] DOB: [birth year]   Cancelled Treatment:    Reason Eval/Treat Not Completed: Medical issues which prohibited therapy(Patient noted with transfer to CCU due to decline in respiratory status.  Per policy, will require new orders to resume care after change in status, transfer to higher level of care.  Please reconsult as appropriate.)  Horris Speros H. Owens Shark, PT, DPT, NCS 10/08/17, 10:13 PM [phone]

## 2017-10-08 NOTE — Progress Notes (Signed)
   10/08/17 1735  Clinical Encounter Type  Visited With Patient  Visit Type Code  Referral From Nurse  Consult/Referral To Chaplain  Spiritual Encounters  Spiritual Needs Prayer   CH received an RR PG. CH reported to PT's RM and provided silent prayer. PT was moved to ICU and [place] will follow up when PT is settled.

## 2017-10-08 NOTE — Progress Notes (Signed)
Talked to Dr. Leslye Peer about patient's respiratory distress and called rapid response, patient on Cpap and received Duoneb and Xanax. Patient is still short of breath, Tammy RN gave IV lasix and Nitro also did stat EKG. Patient was transferred to CCU, daughter was notified about the transfer and belongings where sent to CCU.

## 2017-10-08 NOTE — Progress Notes (Signed)
Patient has continued SOB.  Requested she be put on BiPaP.  Respiratory called. Will try BiPap to improve this bout of dyspnea.

## 2017-10-08 NOTE — Progress Notes (Signed)
Initial Nutrition Assessment  DOCUMENTATION CODES:   Morbid obesity  INTERVENTION:   Boost Breeze po TID, each supplement provides 250 kcal and 9 grams of protein  MVI daily  Liberalize diet   NUTRITION DIAGNOSIS:   Inadequate oral intake related to acute illness as evidenced by per patient/family report  GOAL:   Patient will meet greater than or equal to 90% of their needs  MONITOR:   PO intake, Supplement acceptance, Labs, Weight trends, Skin, I & O's  REASON FOR ASSESSMENT:   LOS    ASSESSMENT:   72 y.o. female with a PMHx ofchronic diastolic heart failure, atrial fibrillation, chronic hypercarbic respiratory failure, COPD, morbid obesity, hyperlipidemia, diabetes mellitus type 2, chronic kidney disease stage II,historyof intracranial hemorrhagewho was admitted to [facility] on 4[facility]10[facility]2019for evaluation of shortness of breath.    Met with pt in room today. Pt reports poor appetite and oral intake for several months pta. Pt reports that she isn't allowed to eat salt and that food just doesn't taste good anymore. Pt eating 50% of meals currently. Pt does not like Ensure/Nepro supplements but is willing to try Boost Breeze. RD educated pt on the importance of getting adequate protein to preserve lean muscle. Protein is needed to replenish losses that occured during dialysis. Per Nephrology note, no further dialysis needed. RD will liberalize diet to try and increase oral intake. Per chart, pt appears wt stable.   Medications reviewed and include: doxycycline, insulin  Labs reviewed: Cl 98(L), BUN 76(H), creat 3.76(H), Ca 8.3(L), P 5.3(H) Hgb 9.2(L), Hct 28.9(L) cbgs- 236, 381, 128 x 48hrs AIC 5.8(H)- 4/10  NUTRITION - FOCUSED PHYSICAL EXAM:    Most Recent Value  Orbital Region  No depletion  Upper Arm Region  No depletion  Thoracic and Lumbar Region  No depletion  Buccal Region  No depletion  Temple Region  No depletion  Clavicle Bone Region  No depletion  Clavicle  and Acromion Bone Region  No depletion  Scapular Bone Region  No depletion  Dorsal Hand  No depletion  Patellar Region  No depletion  Anterior Thigh Region  No depletion  Posterior Calf Region  No depletion  Edema (RD Assessment)  Moderate  Hair  Reviewed  Eyes  Reviewed  Mouth  Reviewed  Skin  Reviewed  Nails  Reviewed     Diet Order:  Diet regular Room service appropriate? Yes; Fluid consistency: Thin  EDUCATION NEEDS:   Education needs have been addressed  Skin:  Skin Assessment: Reviewed RN Assessment  Last BM:  4/18- type 6  Height:   Ht Readings from Last 1 Encounters:  09/30/17 '5\' 1"'  (1.549 m)    Weight:   Wt Readings from Last 1 Encounters:  10/08/17 242 lb 4.8 oz (109.9 kg)    Ideal Body Weight:  47.7 kg  BMI:  Body mass index is 45.78 kg/m.  Estimated Nutritional Needs:   Kcal:  1700-2000kcal/day   Protein:  88-110g/day   Fluid:  >1.4L/day or per MD  Koleen Distance MS, RD, LDN Pager #[phone] After Hours Pager: [phone]

## 2017-10-08 NOTE — Progress Notes (Signed)
Patient ID: Debra Saunders, female   DOB: 03/11/1946, 72 y.o.   MRN: [id number]   [facility] PROGRESS NOTE  Debra Saunders DXA:[id number] DOB: 11-23-1945 DOA: 09/30/2017 PCP: [name], MD  HPI/Subjective: Rapid response called and she was very short of breath.  Transferred to ICU, breathing better once placed on bipap  Objective: Vitals:   10/08/17 0746 10/08/17 1706  BP: 133/74 (!) 157/95  Pulse: (!) 59 84  Resp: 16   Temp: 98.1 F (36.7 C)   SpO2: 100% 97%    Filed Weights   10/07/17 0945 10/07/17 1205 10/08/17 0335  Weight: 112.8 kg (248 lb 10.9 oz) 113 kg (249 lb 1.9 oz) 109.9 kg (242 lb 4.8 oz)    ROS: Review of Systems  Unable to perform ROS: Acuity of condition  Respiratory: Positive for shortness of breath.   Cardiovascular: Negative for chest pain.   Exam: Physical Exam  Constitutional: She is oriented to person, place, and time.  HENT:  Nose: No mucosal edema.  Mouth/Throat: No oropharyngeal exudate.  Eyes: Pupils are equal, round, and reactive to light. Conjunctivae, EOM and lids are normal.  Neck: No JVD present. Carotid bruit is not present. No edema present. No thyroid mass and no thyromegaly present.  Cardiovascular: S1 normal and S2 normal. An irregularly irregular rhythm present. Exam reveals no gallop.  No murmur heard. Pulses:      Dorsalis pedis pulses are 2+ on the right side, and 2+ on the left side.  Respiratory: Accessory muscle usage present. She is in respiratory distress. She has decreased breath sounds in the right middle field, the right lower field, the left middle field and the left lower field. She has no wheezes. She has rhonchi in the right middle field and the left middle field. She has rales in the right lower field and the left lower field.  GI: Soft. Bowel sounds are normal. There is no tenderness.  Musculoskeletal:       Right ankle: She exhibits swelling.       Left ankle: She exhibits swelling.  Lymphadenopathy:     She has no cervical adenopathy.  Neurological: She is alert and oriented to person, place, and time. No cranial nerve deficit.  Skin: Skin is warm. No rash noted. Nails show no clubbing.  Psychiatric: She has a normal mood and affect.      Data Reviewed: Basic Metabolic Panel: Recent Labs  Lab 10/03/17 0449 10/04/17 0552 10/05/17 0748 10/06/17 0020 10/06/17 1012 10/07/17 0608  NA 138 132* 131* 125*  --  135  K 2.9* 5.2* 4.7 5.6*  --  4.5  CL 92* 92* 91* 88*  --  98*  CO2 36* 32 28 26  --  28  GLUCOSE 139* 315* 236* 381*  --  128*  BUN 34* 52* 71* 83*  --  76*  CREATININE 1.44* 3.10* 4.13* 4.65*  --  3.76*  CALCIUM 8.7* 8.3* 7.9* 7.8*  --  8.3*  MG 2.2  --   --   --   --   --   PHOS  --   --   --   --  5.5* 5.3*   Acute kidney injury CBC: Recent Labs  Lab 10/03/17 0449 10/06/17 1012 10/07/17 0608 10/08/17 0355  WBC 6.5  --  7.9 7.6  HGB 9.8* 9.7* 9.7* 9.2*  HCT 30.5*  --  30.2* 28.9*  MCV 85.1  --  84.7 85.5  PLT 174  --  192  199   BNP (last 3 results) Recent Labs    08/04/17 1552 08/16/17 0925 09/30/17 1545  BNP 175.0* 655.0* 1,151.0*     CBG: Recent Labs  Lab 10/07/17 2055 10/08/17 0745 10/08/17 1158 10/08/17 1658 10/08/17 1737  GLUCAP 185* 148* 183* 181* 192*    Recent Results (from the past 240 hour(s))  MRSA PCR Screening     Status: Abnormal   Collection Time: 10/01/17  9:37 AM  Result Value Ref Range Status   MRSA by PCR POSITIVE (A) NEGATIVE Final    Comment:        [name] (FDA approved for NASAL specimens only), is one component of a comprehensive MRSA colonization surveillance program. It is not intended to diagnose MRSA infection nor to guide or monitor treatment for MRSA infections. RESULT CALLED TO, READ BACK BY AND VERIFIED WITH:  Gildardo Pounds AT 1207 10/01/17 SDR Performed at [facility], [address] [place], [place] [address]       Scheduled Meds: . furosemide      .  nitroGLYCERIN      . amiodarone  400 mg Oral BID  . budesonide (PULMICORT) nebulizer solution  0.5 mg Nebulization BID  . doxycycline  100 mg Oral Q12H  . feeding supplement  1 Container Oral BID BM  . gabapentin  300 mg Oral QHS  . insulin aspart  0-5 Units Subcutaneous QHS  . insulin aspart  0-9 Units Subcutaneous TID WC  . ipratropium-albuterol  3 mL Nebulization TID  . methylPREDNISolone (SOLU-MEDROL) injection  40 mg Intravenous Q12H  . multivitamin with minerals  1 tablet Oral Daily  . mupirocin ointment   Nasal BID  . rosuvastatin  10 mg Oral Daily  . sildenafil  20 mg Oral TID  . sodium chloride flush  3 mL Intravenous Q12H   Continuous Infusions: . sodium chloride    . heparin 950 Units/hr (10/08/17 0824)    Assessment/Plan:  1. Acute hypoxic respiratory failure- bipap, lasix, cxr.  Case discussed with critical care specialisy 2. Acute diastolic chf with severe mitral regurgitation and pulmonary hypertension- iv lasix x 1 3. Copd exacerbation restart steroids and nebs  Code Status:     Code Status Orders  (From admission, onward)        Start     Ordered   09/30/17 1901  Full code  Continuous     09/30/17 1901    Code Status History    Date Active Date Inactive Code Status Order ID Comments User Context   08/16/2017 1614 08/28/2017 2239 Full Code [id number]  Gorden Harms, MD Inpatient   03/30/2015 2152 04/03/2015 2122 Full Code [id number]  Etta Quill, DO Inpatient   03/27/2015 2208 03/28/2015 1737 Full Code [id number]  Nicholes Mango, MD ED   01/09/2014 1418 01/10/2014 1803 Full Code [id number]  Deboraha Sprang, MD Inpatient   01/16/2013 2335 01/27/2013 2011 Full Code [id number]  Collene Gobble, MD Inpatient     Disposition Plan:  TBD  Consultants:  Cardiology  Time spent: 35 minutes, case discussed with daughter carmen on phone, critical care specialist and nephrology  [place]

## 2017-10-09 DIAGNOSIS — I5023 Acute on chronic systolic (congestive) heart failure: Secondary | ICD-10-CM

## 2017-10-09 LAB — BASIC METABOLIC PANEL
Anion gap: 9 (ref 5–15)
BUN: 46 mg/dL — ABNORMAL HIGH (ref 6–20)
CO2: 28 mmol/L (ref 22–32)
Calcium: 9.3 mg/dL (ref 8.9–10.3)
Chloride: 101 mmol/L (ref 101–111)
Creatinine, Ser: 1.82 mg/dL — ABNORMAL HIGH (ref 0.44–1.00)
GFR calc Af Amer: 30 mL/min — ABNORMAL LOW (ref >60)
GFR calc non Af Amer: 26 mL/min — ABNORMAL LOW (ref >60)
Glucose, Bld: 216 mg/dL — ABNORMAL HIGH (ref 65–99)
Potassium: 4.4 mmol/L (ref 3.5–5.1)
Sodium: 138 mmol/L (ref 135–145)

## 2017-10-09 LAB — GLUCOSE, CAPILLARY
Glucose-Capillary: 224 mg/dL — ABNORMAL HIGH (ref 65–99)
Glucose-Capillary: 191 mg/dL — ABNORMAL HIGH (ref 65–99)
Glucose-Capillary: 227 mg/dL — ABNORMAL HIGH (ref 65–99)
Glucose-Capillary: 257 mg/dL — ABNORMAL HIGH (ref 65–99)
Glucose-Capillary: 253 mg/dL — ABNORMAL HIGH (ref 65–99)

## 2017-10-09 LAB — CBC
HCT: 31.1 % — ABNORMAL LOW (ref 35.0–47.0)
Hemoglobin: 10 g/dL — ABNORMAL LOW (ref 12.0–16.0)
MCH: 27.2 pg (ref 26.0–34.0)
MCHC: 32.2 g/dL (ref 32.0–36.0)
MCV: 84.6 fL (ref 80.0–100.0)
Platelets: 220 10*3/uL (ref 150–440)
RBC: 3.67 MIL/uL — ABNORMAL LOW (ref 3.80–5.20)
RDW: 16.8 % — ABNORMAL HIGH (ref 11.5–14.5)
WBC: 7.9 10*3/uL (ref 3.6–11.0)

## 2017-10-09 LAB — TROPONIN I
Troponin I: 2.7 ng/mL (ref <0.03)
Troponin I: 2.35 ng/mL (ref <0.03)

## 2017-10-09 LAB — APTT: aPTT: 72 seconds — ABNORMAL HIGH (ref 24–36)

## 2017-10-09 LAB — HEPARIN LEVEL (UNFRACTIONATED): Heparin Unfractionated: 0.63 IU/mL (ref 0.30–0.70)

## 2017-10-09 LAB — MAGNESIUM: Magnesium: 1.8 mg/dL (ref 1.7–2.4)

## 2017-10-09 LAB — PHOSPHORUS: Phosphorus: 5.2 mg/dL — ABNORMAL HIGH (ref 2.5–4.6)

## 2017-10-09 MED ORDER — INSULIN ASPART 100 UNIT/ML ~~LOC~~ SOLN
0.0000 [IU] | Freq: Three times a day (TID) | SUBCUTANEOUS | Status: DC
  Administered 2017-10-09: 8 [IU] via SUBCUTANEOUS
  Administered 2017-10-10: 3 [IU] via SUBCUTANEOUS
  Administered 2017-10-10: 8 [IU] via SUBCUTANEOUS
  Administered 2017-10-10 – 2017-10-11 (×2): 3 [IU] via SUBCUTANEOUS
  Administered 2017-10-11: 15 [IU] via SUBCUTANEOUS
  Administered 2017-10-11 – 2017-10-12 (×2): 11 [IU] via SUBCUTANEOUS
  Administered 2017-10-12: 5 [IU] via SUBCUTANEOUS
  Administered 2017-10-12: 6 [IU] via SUBCUTANEOUS
  Administered 2017-10-13: 5 [IU] via SUBCUTANEOUS
  Administered 2017-10-13: 11 [IU] via SUBCUTANEOUS
  Administered 2017-10-13: 8 [IU] via SUBCUTANEOUS
  Administered 2017-10-14: 3 [IU] via SUBCUTANEOUS
  Administered 2017-10-14 – 2017-10-15 (×3): 5 [IU] via SUBCUTANEOUS
  Administered 2017-10-15 (×2): 3 [IU] via SUBCUTANEOUS
  Administered 2017-10-16 – 2017-10-17 (×3): 2 [IU] via SUBCUTANEOUS
  Administered 2017-10-17: 8 [IU] via SUBCUTANEOUS
  Administered 2017-10-18: 5 [IU] via SUBCUTANEOUS
  Administered 2017-10-18 – 2017-10-19 (×2): 3 [IU] via SUBCUTANEOUS
  Administered 2017-10-19: 2 [IU] via SUBCUTANEOUS
  Administered 2017-10-20 – 2017-10-22 (×5): 3 [IU] via SUBCUTANEOUS
  Filled 2017-10-09 (×32): qty 1

## 2017-10-09 MED ORDER — INSULIN ASPART 100 UNIT/ML ~~LOC~~ SOLN
0.0000 [IU] | Freq: Every day | SUBCUTANEOUS | Status: DC
  Administered 2017-10-09 – 2017-10-10 (×2): 3 [IU] via SUBCUTANEOUS
  Administered 2017-10-11: 2 [IU] via SUBCUTANEOUS
  Administered 2017-10-12: 3 [IU] via SUBCUTANEOUS
  Administered 2017-10-13: 4 [IU] via SUBCUTANEOUS
  Filled 2017-10-09 (×6): qty 1

## 2017-10-09 MED ORDER — AMIODARONE HCL 200 MG PO TABS
200.0000 mg | ORAL_TABLET | Freq: Two times a day (BID) | ORAL | Status: DC
  Administered 2017-10-09 – 2017-10-10 (×3): 200 mg via ORAL
  Filled 2017-10-09 (×3): qty 1

## 2017-10-09 MED ORDER — INSULIN ASPART 100 UNIT/ML ~~LOC~~ SOLN: 0.0000 [IU] | Freq: Three times a day (TID) | SUBCUTANEOUS | Status: DC

## 2017-10-09 NOTE — Progress Notes (Signed)
[place] for heparin Indication: atrial fibrillation  Allergies  Allergen Reactions  . Aspirin Hives and Other (See Comments)    Pt states that she is unable to take because she had bleeding in her brain.    . Other Other (See Comments)    Pt states that she is unable to take blood thinners because she had bleeding in her brain.  Blood Thinners-per doctor at [facility]  . Penicillins Hives, Shortness Of Breath, Swelling and Other (See Comments)    Has patient had a PCN reaction causing immediate rash, facial/tongue/throat swelling, SOB or lightheadedness with hypotension: Yes Has patient had a PCN reaction causing severe rash involving mucus membranes or skin necrosis: No Has patient had a PCN reaction that required hospitalization No Has patient had a PCN reaction occurring within the last 10 years: No If all of the above answers are "NO", then may proceed with Cephalosporin use.    Patient Measurements: Height: 5\' 1"  (154.9 cm) Weight: 242 lb 4.8 oz (109.9 kg) IBW/kg (Calculated) : 47.8 Heparin Dosing Weight: 74 kg  Vital Signs: Temp: 98.2 F (36.8 C) (04/19 0200) Temp Source: Axillary (04/19 0200) BP: 103/76 (04/19 0200) Pulse Rate: 73 (04/19 0200)  Labs: Recent Labs    10/07/17 0608  10/07/17 2235 10/08/17 0355 10/09/17 0207  HGB 9.7*  --   --  9.2* 10.0*  HCT 30.2*  --   --  28.9* 31.1*  PLT 192  --   --  199 220  APTT 122*   < > 77* 84* 72*  HEPARINUNFRC 1.53*  --   --  0.77* 0.63  CREATININE 3.76*  --   --   --  1.82*   < > = values in this interval not displayed.    Estimated Creatinine Clearance: 31.1 mL/min (A) (by C-G formula based on SCr of 1.82 mg/dL (H)).   Medical History: Past Medical History:  Diagnosis Date  . Arthritis   . Atrial fibrillation, persistent ([place])    a. s/p TEE-guided DCCV on 08/20/2017  . Chest pain    a. 01/2012 Cath: nonobs dzs;  c. 04/2014 MV: No ischemia.  . Chronic back pain   .  Chronic combined systolic (congestive) and diastolic (congestive) heart failure ([place])    a. Dx 2005 @ [facility];  b. 02/2014 Echo: Ef 55-60%, no rwma, mild MR, PASP 27mmHg. c. 07/2017: EF 30-35%, diffuse HK, trivial AI, severe MR, moderate TR  . Chronic respiratory failure (HCC)    a. on home O2  . COPD (chronic obstructive pulmonary disease) ([place])    a. Home O2 - 3lpm  . Gastritis   . History of intracranial hemorrhage    a. 6/14 described by neurosurgery as "small frontal posttraumatic SAH " - pt was on xarelto @ the time.  . Hyperlipidemia   . Hypertension   . Lipoma of back   . Lymphedema    a. Chronic LLE edema.  . Obesity   . Sepsis with metabolic encephalopathy ([place])   . Sleep apnea   . Streptococcal bacteremia   . Stroke ([place])   . Symptomatic bradycardia    a. s/p BSX PPM.  . Thyroid nodule   . Type II diabetes mellitus ([place])   . Urinary incontinence     Medications:  Patient takes apixaban 5mg  BID at home for afib. She was initially started on apixaban 5mg  BID when she was admitted, but her dose was changed to apixaban 2.5mg  BID  on 4/14 due to her increase in Scr (1.4 --> 3.1). Cardiology stated that decrease in apixaban dose was for instability of patient's renal function.   Assessment: Patient is a 72 yo female admitted with exacerbation of CHF in the setting of persistent afib.  As above, patient was on apixaban 2.5mg  BID due to unstable renal function. Per cardiology, as renal function continues to decline (Scr 4.13 on 4/15) wish to transition patient from apixaban to heparin. Pharmacy has been consulted to dose and manage heparin. Per cardiology, will start heparin 8 hours after last dose of apixaban.   Goal of Therapy:  Heparin level 0.3-0.7 units/ml  APTT 66-102s Monitor platelets by anticoagulation protocol: Yes   Plan:  4/17 APTT elevated at 122. Will reduced heparin drip rate from 1100 units/hr to 950 units/hr. RN made aware. No reports of any further blood in  stool Hgb is stable. Recheck APTT in 8 hours. APPT and HL not coralating yet.  4/17 16:11 aPTT therapeutic x 1. Continue current rate. Will recheck aPTT in 6 hours. Continue HL daily until levels correlate with aPTT. CBC daily per protocol.  Nathan A. [place], [place].D, BCPS Clinical Pharmacist 10/09/17 4:07 AM   4/17 PM aPTT 77. Continue current regimen. Recheck aPTT, heparin level, and CBC with AM labs.  4/18 AM aPTT 84, heparin level 0.77. Continue current regimen. Recheck aPTT, heparin level, and CBC with tomorrow AM labs.  4/19 AM heparin level 0.63, aPTT 72. Now correlating in therapeutic range. Will follow and adjust by heparin level only. Continue current regimen. Recheck heparin level and CBC with tomorrow AM labs.  Sim Boast, PharmD, BCPS  10/09/17 4:07 AM

## 2017-10-09 NOTE — Progress Notes (Signed)
[facility]  ROUNDING NOTE   Subjective:  Patient explains respiratory distress yesterday and was moved over to the critical care unit. She is currently on BiPAP. Urine output was 2.7 L over the preceding 24 hours.  Objective:  Vital signs in last 24 hours:  Temp:  [98 F (36.7 C)-98.3 F (36.8 C)] 98 F (36.7 C) (04/19 0800) Pulse Rate:  [63-96] 72 (04/19 0800) Resp:  [17-37] 24 (04/19 0800) BP: (96-157)/(68-95) 99/68 (04/19 0700) SpO2:  [97 %-100 %] 99 % (04/19 0800) FiO2 (%):  [40 %] 40 % (04/19 0844) Weight:  [109 kg (240 lb 4.8 oz)] 109 kg (240 lb 4.8 oz) (04/19 0400)  Weight change: -3.8 kg (-8 lb 6 oz) Filed Weights   10/07/17 1205 10/08/17 0335 10/09/17 0400  Weight: 113 kg (249 lb 1.9 oz) 109.9 kg (242 lb 4.8 oz) 109 kg (240 lb 4.8 oz)    Intake/Output: I/O last 3 completed shifts: In: 743.8 [I.V.:743.8] Out: 3760 [Urine:3760]   Intake/Output this shift:  Total I/O In: -  Out: 50 [Urine:50]  Physical Exam: General: Critically ill-appearing  Head: Normocephalic, atraumatic. Bipap facemask on  Eyes: Anicteric  Neck: Supple, trachea midline  Lungs:  Scattered rhonchi, on bipap  Heart: S1S2 no rubs  Abdomen:  Soft, nontender, bowel sounds present  Extremities: 1+ peripheral edema.  Neurologic: Awake, alert, following commands  Skin: No lesions       Basic Metabolic Panel: Recent Labs  Lab 10/03/17 0449 10/04/17 0552 10/05/17 0748 10/06/17 0020 10/06/17 1012 10/07/17 0608 10/09/17 0207 10/09/17 0411  NA 138 132* 131* 125*  --  135 138  --   K 2.9* 5.2* 4.7 5.6*  --  4.5 4.4  --   CL 92* 92* 91* 88*  --  98* 101  --   CO2 36* 32 28 26  --  28 28  --   GLUCOSE 139* 315* 236* 381*  --  128* 216*  --   BUN 34* 52* 71* 83*  --  76* 46*  --   CREATININE 1.44* 3.10* 4.13* 4.65*  --  3.76* 1.82*  --   CALCIUM 8.7* 8.3* 7.9* 7.8*  --  8.3* 9.3  --   MG 2.2  --   --   --   --   --   --  1.8  PHOS  --   --   --   --  5.5* 5.3*  --  5.2*     Liver Function Tests: No results for input(s): AST, ALT, ALKPHOS, BILITOT, PROT, ALBUMIN in the last 168 hours. No results for input(s): LIPASE, AMYLASE in the last 168 hours. No results for input(s): AMMONIA in the last 168 hours.  CBC: Recent Labs  Lab 10/03/17 0449 10/06/17 1012 10/07/17 0608 10/08/17 0355 10/09/17 0207  WBC 6.5  --  7.9 7.6 7.9  HGB 9.8* 9.7* 9.7* 9.2* 10.0*  HCT 30.5*  --  30.2* 28.9* 31.1*  MCV 85.1  --  84.7 85.5 84.6  PLT 174  --  192 199 220    Cardiac Enzymes: Recent Labs  Lab 10/09/17 0834  TROPONINI 2.70*    BNP: Invalid input(s): POCBNP  CBG: Recent Labs  Lab 10/08/17 1658 10/08/17 1737 10/08/17 1759 10/08/17 2224 10/09/17 0750  GLUCAP 181* 192* 224* 181* 191*    Microbiology: Results for orders placed or performed during the hospital encounter of 09/30/17  MRSA PCR Screening     Status: Abnormal   Collection Time: 10/01/17  9:75 AM  Result Value Ref Range Status   MRSA by PCR POSITIVE (A) NEGATIVE Final    Comment:        [name] (FDA approved for NASAL specimens only), is one component of a comprehensive MRSA colonization surveillance program. It is not intended to diagnose MRSA infection nor to guide or monitor treatment for MRSA infections. RESULT CALLED TO, READ BACK BY AND VERIFIED WITH:  Gildardo Pounds AT 1207 10/01/17 SDR Performed at [facility], [address] [place], [place] [address]     Coagulation Studies: No results for input(s): LABPROT, INR in the last 72 hours.  Urinalysis: No results for input(s): COLORURINE, LABSPEC, PHURINE, GLUCOSEU, HGBUR, BILIRUBINUR, KETONESUR, PROTEINUR, UROBILINOGEN, NITRITE, LEUKOCYTESUR in the last 72 hours.  Invalid input(s): APPERANCEUR    Imaging: Dg Chest Port 1 View  Result Date: 10/08/2017 CLINICAL DATA:  SOB. Hx of A-fib, CHF, COPD, HTN, stroke, diabetes, pacemaker insertion. Former smoker. EXAM: PORTABLE CHEST 1 VIEW  COMPARISON:  Chest x-ray dated 09/30/2017. FINDINGS: Stable cardiomegaly. LEFT chest wall pacemaker/ICD apparatus appears stable. Bilateral interstitial edema pattern, perhaps slightly worsened compared to the previous study. No pleural effusion or pneumothorax seen. Stable elevation of the RIGHT hemidiaphragm. IMPRESSION: Cardiomegaly with bilateral interstitial edema, consistent with CHF/volume overload, perhaps slightly worsened compared to the previous chest x-ray of 09/30/2017. Electronically Signed   By: Franki Cabot M.D.   On: 10/08/2017 18:36     Medications:   . sodium chloride    . heparin 950 Units/hr (10/08/17 2000)   . amiodarone  200 mg Oral BID  . budesonide (PULMICORT) nebulizer solution  0.5 mg Nebulization BID  . chlorhexidine  15 mL Mouth Rinse BID  . doxycycline  100 mg Oral Q12H  . feeding supplement  1 Container Oral BID BM  . gabapentin  300 mg Oral QHS  . insulin aspart  0-5 Units Subcutaneous QHS  . insulin aspart  0-9 Units Subcutaneous TID WC  . ipratropium-albuterol  3 mL Nebulization TID  . mouth rinse  15 mL Mouth Rinse q12n4p  . methylPREDNISolone (SOLU-MEDROL) injection  40 mg Intravenous Q12H  . multivitamin with minerals  1 tablet Oral Daily  . mupirocin ointment   Nasal BID  . rosuvastatin  10 mg Oral Daily  . sildenafil  20 mg Oral TID  . sodium chloride flush  3 mL Intravenous Q12H   sodium chloride, acetaminophen, ALPRAZolam, ipratropium-albuterol, menthol-cetylpyridinium, nitroGLYCERIN, ondansetron (ZOFRAN) IV, ondansetron, promethazine, sodium chloride flush  Assessment/ Plan:  72 y.o. female with a PMHx of chronic diastolic heart failure, atrial fibrillation, chronic hypercarbic respiratory failure, COPD, morbid obesity, hyperlipidemia, diabetes mellitus type 2, chronic kidney disease stage II, history of intracranial hemorrhage who was admitted to [facility] on 09/30/2017 for evaluation of shortness of breath.   1.  Acute renal failure/chronic  kidney disease stage II.  Acute renal failure appears to be multifactorial.  Contributions from over diuresis as well as relative hypotension.  Patient underwent dialysis x 2 sessions.  -Renal function has significantly improved.  Creatinine down to 1.8 and urine output was 2.7 L over the preceding 24 hours.  Therefore no acute indication for dialysis at the moment.  We will continue to monitor renal parameters daily.  2.  Anemia of chronic kidney disease.    Hemoglobin increased to 10.0.  No indication for Procrit.  3.  Hyperkalemia.  Much improved after dialysis.  Serum potassium currently 4.4.  4.  Hyponatremia.  Serum sodium has also  stabilized and is currently 138.     LOS: 9 Debra Saunders 4/19/201911:07 AM

## 2017-10-09 NOTE — Progress Notes (Signed)
Dr. Fletcher Anon notified of elevated troponin. Received verbal order to have another troponin drawn in 6 hrs. Order placed. Wilnette Kales

## 2017-10-09 NOTE — Progress Notes (Signed)
Patient ID: Debra Saunders, female   DOB: 1945-10-26, 72 y.o.   MRN: [id number]   [facility] PROGRESS NOTE  ADREONA BRAND WUX:[id number] DOB: Mar 11, 1946 DOA: 09/30/2017 PCP: [name], MD  HPI/Subjective: Patient doing better shortness of breath improved   Objective: Vitals:   10/09/17 1300 10/09/17 1406  BP: (!) 96/31   Pulse: 72   Resp: 18   Temp: 98.4 F (36.9 C)   SpO2: 99% 100%    Filed Weights   10/07/17 1205 10/08/17 0335 10/09/17 0400  Weight: 113 kg (249 lb 1.9 oz) 109.9 kg (242 lb 4.8 oz) 109 kg (240 lb 4.8 oz)    ROS: Review of Systems  Unable to perform ROS: Acuity of condition  Respiratory: Positive for shortness of breath.   Cardiovascular: Negative for chest pain.   Exam: Physical Exam  Constitutional: She is oriented to person, place, and time.  HENT:  Nose: No mucosal edema.  Mouth/Throat: No oropharyngeal exudate.  Eyes: Pupils are equal, round, and reactive to light. Conjunctivae, EOM and lids are normal.  Neck: No JVD present. Carotid bruit is not present. No edema present. No thyroid mass and no thyromegaly present.  Cardiovascular: S1 normal and S2 normal. An irregularly irregular rhythm present. Exam reveals no gallop.  No murmur heard. Pulses:      Dorsalis pedis pulses are 2+ on the right side, and 2+ on the left side.  Respiratory: Accessory muscle usage present. She is in respiratory distress. She has decreased breath sounds in the right middle field, the right lower field, the left middle field and the left lower field. She has no wheezes. She has rhonchi in the right middle field and the left middle field. She has rales in the right lower field and the left lower field.  GI: Soft. Bowel sounds are normal. There is no tenderness.  Musculoskeletal:       Right ankle: She exhibits swelling.       Left ankle: She exhibits swelling.  Lymphadenopathy:    She has no cervical adenopathy.  Neurological: She is alert and oriented  to person, place, and time. No cranial nerve deficit.  Skin: Skin is warm. No rash noted. Nails show no clubbing.  Psychiatric: She has a normal mood and affect.      Data Reviewed: Basic Metabolic Panel: Recent Labs  Lab 10/03/17 0449 10/04/17 0552 10/05/17 0748 10/06/17 0020 10/06/17 1012 10/07/17 0608 10/09/17 0207 10/09/17 0411  NA 138 132* 131* 125*  --  135 138  --   K 2.9* 5.2* 4.7 5.6*  --  4.5 4.4  --   CL 92* 92* 91* 88*  --  98* 101  --   CO2 36* 32 28 26  --  28 28  --   GLUCOSE 139* 315* 236* 381*  --  128* 216*  --   BUN 34* 52* 71* 83*  --  76* 46*  --   CREATININE 1.44* 3.10* 4.13* 4.65*  --  3.76* 1.82*  --   CALCIUM 8.7* 8.3* 7.9* 7.8*  --  8.3* 9.3  --   MG 2.2  --   --   --   --   --   --  1.8  PHOS  --   --   --   --  5.5* 5.3*  --  5.2*   Acute kidney injury CBC: Recent Labs  Lab 10/03/17 0449 10/06/17 1012 10/07/17 0608 10/08/17 0355 10/09/17 0207  WBC 6.5  --  7.9 7.6 7.9  HGB 9.8* 9.7* 9.7* 9.2* 10.0*  HCT 30.5*  --  30.2* 28.9* 31.1*  MCV 85.1  --  84.7 85.5 84.6  PLT 174  --  192 199 220   BNP (last 3 results) Recent Labs    08/04/17 1552 08/16/17 0925 09/30/17 1545  BNP 175.0* 655.0* 1,151.0*     CBG: Recent Labs  Lab 10/08/17 1737 10/08/17 1759 10/08/17 2224 10/09/17 0750 10/09/17 1144  GLUCAP 192* 224* 181* 191* 227*    Recent Results (from the past 240 hour(s))  MRSA PCR Screening     Status: Abnormal   Collection Time: 10/01/17  9:37 AM  Result Value Ref Range Status   MRSA by PCR POSITIVE (A) NEGATIVE Final    Comment:        [name] (FDA approved for NASAL specimens only), is one component of a comprehensive MRSA colonization surveillance program. It is not intended to diagnose MRSA infection nor to guide or monitor treatment for MRSA infections. RESULT CALLED TO, READ BACK BY AND VERIFIED WITH:  Gildardo Pounds AT 1207 10/01/17 SDR Performed at [facility], [address] [place], [place] [address]       Scheduled Meds: . amiodarone  200 mg Oral BID  . budesonide (PULMICORT) nebulizer solution  0.5 mg Nebulization BID  . chlorhexidine  15 mL Mouth Rinse BID  . doxycycline  100 mg Oral Q12H  . feeding supplement  1 Container Oral BID BM  . gabapentin  300 mg Oral QHS  . insulin aspart  0-5 Units Subcutaneous QHS  . insulin aspart  0-9 Units Subcutaneous TID WC  . ipratropium-albuterol  3 mL Nebulization TID  . mouth rinse  15 mL Mouth Rinse q12n4p  . methylPREDNISolone (SOLU-MEDROL) injection  40 mg Intravenous Q12H  . multivitamin with minerals  1 tablet Oral Daily  . mupirocin ointment   Nasal BID  . rosuvastatin  10 mg Oral Daily  . sildenafil  20 mg Oral TID  . sodium chloride flush  3 mL Intravenous Q12H   Continuous Infusions: . sodium chloride    . heparin 950 Units/hr (10/09/17 1450)    Assessment/Plan:  1. Acute respiratory failure in a setting of acute on chronic diastolic CHF with flash pulmonary edema patient received Lasix now respiratory status improve due to acute renal failure no further Lasix continue monitoring ICU 2. Acute kidney injury on CKD stage 3.  Likely over diuresis and hypotension .  Renal function improved hold Spironolactone and losartan.  Nephrology did dialysis yesterday second session.  Blood pressure is responded and the patient is now making urine. 3. Relative hypotension.  Continue medications. 4. Acute on chronic diastolic congestive heart failure.  Patient has moderate to severe mitral valve regurgitation.   Holding all medications secondary to hypotension and acute kidney injury.   5. Chronic atrial fibrillation continue on heparin drip and amiodarone.  Holding Toprol. 6. Chronic hypoxic hypercarbic respiratory failure.  Chronically on 4 L of oxygen. 7. Morbid obesity.  Weight loss needed 8. COPD exacerbation, continue nebulizer treatments.   taper prednisone to off.  Since lungs sound better this could be more  COPD rather than heart failure.  9. Hyperlipidemia unspecified on Crestor 10. Type 2 diabetes mellitus on sliding scale insulin 11. Upper respiratory tract infection on oral doxycycline 12. Hyperlipidemia unspecified on Crestor 13. No further epistaxsis 14. No further rectal bleeding    Code Status:     Code Status  Orders  (From admission, onward)        Start     Ordered   09/30/17 1901  Full code  Continuous     09/30/17 1901    Code Status History    Date Active Date Inactive Code Status Order ID Comments User Context   08/16/2017 1614 08/28/2017 2239 Full Code [id number]  Gorden Harms, MD Inpatient   03/30/2015 2152 04/03/2015 2122 Full Code [id number]  Etta Quill, DO Inpatient   03/27/2015 2208 03/28/2015 1737 Full Code [id number]  Nicholes Mango, MD ED   01/09/2014 1418 01/10/2014 1803 Full Code [id number]  Deboraha Sprang, MD Inpatient   01/16/2013 2335 01/27/2013 2011 Full Code [id number]  Collene Gobble, MD Inpatient     Disposition Plan:  TBD  Consultants:  Cardiology  Time spent: 35 minutes, case discussed with daughter carmen on phone, critical care specialist and nephrology  [facility]

## 2017-10-09 NOTE — Progress Notes (Signed)
Progress Note  Patient Name: Debra Saunders Date of Encounter: 10/09/2017  Primary Cardiologist: [name], MD  Subjective   She had acute respiratory distress last evening and did not respond very well to BiPAP and thus was transferred to the ICU.  Chest x-ray showed pulmonary edema with cardiomegaly.  She was given 1 dose of IV furosemide 80 mg with subsequent improvement.  She feels better today and she is no longer on BiPAP.  She complains of continued dry cough.  Renal function has improved.  Inpatient Medications    Scheduled Meds: . amiodarone  200 mg Oral BID  . budesonide (PULMICORT) nebulizer solution  0.5 mg Nebulization BID  . chlorhexidine  15 mL Mouth Rinse BID  . doxycycline  100 mg Oral Q12H  . feeding supplement  1 Container Oral BID BM  . gabapentin  300 mg Oral QHS  . insulin aspart  0-5 Units Subcutaneous QHS  . insulin aspart  0-9 Units Subcutaneous TID WC  . ipratropium-albuterol  3 mL Nebulization TID  . mouth rinse  15 mL Mouth Rinse q12n4p  . methylPREDNISolone (SOLU-MEDROL) injection  40 mg Intravenous Q12H  . multivitamin with minerals  1 tablet Oral Daily  . mupirocin ointment   Nasal BID  . rosuvastatin  10 mg Oral Daily  . sildenafil  20 mg Oral TID  . sodium chloride flush  3 mL Intravenous Q12H   Continuous Infusions: . sodium chloride    . heparin 950 Units/hr (10/08/17 2000)   PRN Meds: sodium chloride, acetaminophen, ALPRAZolam, ipratropium-albuterol, menthol-cetylpyridinium, nitroGLYCERIN, ondansetron (ZOFRAN) IV, ondansetron, promethazine, sodium chloride flush   Vital Signs    Vitals:   10/09/17 0300 10/09/17 0400 10/09/17 0500 10/09/17 0541  BP: 111/70 96/70 108/71   Pulse: 76 63 71   Resp: (!) 37 (!) 23 (!) 31   Temp:      TempSrc:      SpO2: 100% 99% 99% 100%  Weight:  240 lb 4.8 oz (109 kg)    Height:        Intake/Output Summary (Last 24 hours) at 10/09/2017 0741 Last data filed at 10/09/2017 0500 Gross per 24  hour  Intake 743.75 ml  Output 2760 ml  Net -2016.25 ml   Filed Weights   10/07/17 1205 10/08/17 0335 10/09/17 0400  Weight: 249 lb 1.9 oz (113 kg) 242 lb 4.8 oz (109.9 kg) 240 lb 4.8 oz (109 kg)    Physical Exam   GEN: Well nourished, well developed, in no acute distress.  HEENT: Grossly normal.  Neck: Supple, obese, difficult to gauge jvp. Cardiac: IR, IR, no murmurs, rubs, or gallops. No clubbing, cyanosis.  Trace RLE edema, 1+ LLE edema.  Radials/DP/PT 1+ and equal bilaterally.  Respiratory:  Respirations regular and unlabored, clear to auscultation bilaterally. GI: Obese, Soft, nontender, nondistended, BS + x 4. MS: no deformity or atrophy. Skin: warm and dry, no rash. Neuro:  Strength and sensation are intact. Psych: AAOx3.  Normal affect.  Labs    Chemistry Recent Labs  Lab 10/06/17 0020 10/07/17 0608 10/09/17 0207  NA 125* 135 138  K 5.6* 4.5 4.4  CL 88* 98* 101  CO2 26 28 28   GLUCOSE 381* 128* 216*  BUN 83* 76* 46*  CREATININE 4.65* 3.76* 1.82*  CALCIUM 7.8* 8.3* 9.3  GFRNONAA 8* 11* 26*  GFRAA 10* 13* 30*  ANIONGAP 11 9 9      Hematology Recent Labs  Lab 10/07/17 [phone] 10/08/17 0355 10/09/17 2992  WBC 7.9 7.6 7.9  RBC 3.57* 3.38* 3.67*  HGB 9.7* 9.2* 10.0*  HCT 30.2* 28.9* 31.1*  MCV 84.7 85.5 84.6  MCH 27.1 27.4 27.2  MCHC 32.1 32.0 32.2  RDW 16.6* 16.8* 16.8*  PLT 192 199 220     Radiology    Dg Chest Port 1 View  Result Date: 10/08/2017 CLINICAL DATA:  SOB. Hx of A-fib, CHF, COPD, HTN, stroke, diabetes, pacemaker insertion. Former smoker. EXAM: PORTABLE CHEST 1 VIEW COMPARISON:  Chest x-ray dated 09/30/2017. FINDINGS: Stable cardiomegaly. LEFT chest wall pacemaker/ICD apparatus appears stable. Bilateral interstitial edema pattern, perhaps slightly worsened compared to the previous study. No pleural effusion or pneumothorax seen. Stable elevation of the RIGHT hemidiaphragm. IMPRESSION: Cardiomegaly with bilateral interstitial edema,  consistent with CHF/volume overload, perhaps slightly worsened compared to the previous chest x-ray of 09/30/2017. Electronically Signed   By: Franki Cabot M.D.   On: 10/08/2017 18:36    Telemetry    Afib w/ V pacing on demand - Personally Reviewed  Cardiac Studies   ECHO: 10/01/17 - Left ventricle: The cavity size was normal. There was mild concentric hypertrophy. Systolic function was normal. The estimated ejection fraction was in the range of 50% to 55%. Wall motion was normal; there were no regional wall motion abnormalities. The study is not technically sufficient to allow evaluation of LV diastolic function. - Mitral valve: There was moderate to severe regurgitation. - Left atrium: The atrium was moderately dilated. - Right ventricle: The cavity size was mildly dilated. Wall thickness was normal. Systolic function was mildly reduced. - Right atrium: The atrium was moderately dilated. - Tricuspid valve: There was moderate regurgitation. - Pulmonary arteries: Systolic pressure was severely elevated PA peak pressure: 69 mm Hg (S).  Patient Profile     72 y.o. female with a history of nonobstructive CAD, chronic combined systolic and diastolic congestive heart failure, persistent atrial fibrilation, symptomatic bradycardia status post permanent pacemaker, frequent PVCs, chronic respiratory failure with hypoxia on home O2, COPD, hypertension, hyperlipidemia, obesity, type 2 diabetes mellitus, traumatic subarachnoid hemorrhage in the setting of Xarelto, left lower extremity DVT status post IVC filter, chronic right sided atypical chest pain, chronic left lower extremity lymphedema, prior stroke, sleep apnea, and obesity, who is being seen for the evaluation ofacute on chronic combined heart failure in the setting of persistent atrial fibrillationat the request of Dr. Posey Pronto.  Assessment & Plan    1.   Acute renal failure due to ATN:   The patient required 2 sessions of  dialysis but renal function started improving after that.  Continue to avoid nephrotoxins and aggressive diuresis.  2.  Acute on chronic combined systolic and diastolic CHF: It is not entirely clear why she went into flash pulmonary edema last evening but I suspect that her mitral regurgitation and continued atrial fibrillation is playing a role.  I am going to check troponin to rule out an ischemic event. The patient responded with 1 dose of IV furosemide and I am hesitant to diurese her any further given that she is in the post ATN phase of renal failure and tends to go into renal failure very quickly with diuresis likely due to the presence of pulmonary hypertension and RV dysfunction.  She is very preload dependent.  3.  Persistent AF:  Rate controlled.  Likely contributing to CHF.   She received 5.2 g of amiodarone loading.  I decreased the dose to 200 mg twice daily.  She has not missed anticoagulation and currently  on unfractionated heparin.  Once renal function goes back to normal, we can switch to Eliquis 5 mg twice daily. I recommend proceeding with cardioversion on Monday as long as respiratory status allows.  4.  Mod to Sev MR:  Likely playing a role in volume issues.  Plan to f/u echo once in sinus rhythm.  5.  PAH:  Playing a role in volume issues.  Cont sildenafil.    Signed, [name], MD  10/09/2017, 7:41 AM    For questions or updates, please contact   Please consult [email or web address] for contact info under Cardiology/STEMI.

## 2017-10-09 NOTE — Progress Notes (Signed)
[place] for heparin Indication: atrial fibrillation  Allergies  Allergen Reactions  . Aspirin Hives and Other (See Comments)    Pt states that she is unable to take because she had bleeding in her brain.    . Other Other (See Comments)    Pt states that she is unable to take blood thinners because she had bleeding in her brain.  Blood Thinners-per doctor at [facility]  . Penicillins Hives, Shortness Of Breath, Swelling and Other (See Comments)    Has patient had a PCN reaction causing immediate rash, facial/tongue/throat swelling, SOB or lightheadedness with hypotension: Yes Has patient had a PCN reaction causing severe rash involving mucus membranes or skin necrosis: No Has patient had a PCN reaction that required hospitalization No Has patient had a PCN reaction occurring within the last 10 years: No If all of the above answers are "NO", then may proceed with Cephalosporin use.    Patient Measurements: Height: 5\' 1"  (154.9 cm) Weight: 240 lb 4.8 oz (109 kg) IBW/kg (Calculated) : 47.8 Heparin Dosing Weight: 74 kg  Vital Signs: Temp: 98.4 F (36.9 C) (04/19 2000) Temp Source: Axillary (04/19 2000) BP: 114/73 (04/19 2000) Pulse Rate: 68 (04/19 2038)  Labs: Recent Labs    10/07/17 7939  10/07/17 2235 10/08/17 0355 10/09/17 0207 10/09/17 0834 10/09/17 1420  HGB 9.7*  --   --  9.2* 10.0*  --   --   HCT 30.2*  --   --  28.9* 31.1*  --   --   PLT 192  --   --  199 220  --   --   APTT 122*   < > 77* 84* 72*  --   --   HEPARINUNFRC 1.53*  --   --  0.77* 0.63  --   --   CREATININE 3.76*  --   --   --  1.82*  --   --   TROPONINI  --   --   --   --   --  2.70* 2.35*   < > = values in this interval not displayed.    Estimated Creatinine Clearance: 31 mL/min (A) (by C-G formula based on SCr of 1.82 mg/dL (H)).   Medical History: Past Medical History:  Diagnosis Date  . Arthritis   . Atrial fibrillation, persistent ([place])    a. s/p  TEE-guided DCCV on 08/20/2017  . Chest pain    a. 01/2012 Cath: nonobs dzs;  c. 04/2014 MV: No ischemia.  . Chronic back pain   . Chronic combined systolic (congestive) and diastolic (congestive) heart failure ([place])    a. Dx 2005 @ [facility];  b. 02/2014 Echo: Ef 55-60%, no rwma, mild MR, PASP 2mmHg. c. 07/2017: EF 30-35%, diffuse HK, trivial AI, severe MR, moderate TR  . Chronic respiratory failure (HCC)    a. on home O2  . COPD (chronic obstructive pulmonary disease) ([place])    a. Home O2 - 3lpm  . Gastritis   . History of intracranial hemorrhage    a. 6/14 described by neurosurgery as "small frontal posttraumatic SAH " - pt was on xarelto @ the time.  . Hyperlipidemia   . Hypertension   . Lipoma of back   . Lymphedema    a. Chronic LLE edema.  . Obesity   . Sepsis with metabolic encephalopathy ([place])   . Sleep apnea   . Streptococcal bacteremia   . Stroke ([place])   . Symptomatic bradycardia  a. s/p BSX PPM.  . Thyroid nodule   . Type II diabetes mellitus ([place])   . Urinary incontinence     Medications:  Patient takes apixaban 5mg  BID at home for afib. She was initially started on apixaban 5mg  BID when she was admitted, but her dose was changed to apixaban 2.5mg  BID on 4/14 due to her increase in Scr (1.4 --> 3.1). Cardiology stated that decrease in apixaban dose was for instability of patient's renal function.   Assessment: Pharmacy consulted for heparin drip management for 72 yo female transitioned from apixaban to heparin secondary to decline in renal function. Patient with past medical history significant for atrial fibrillation and heart failure. Patient currently receiving heparin at 950 units/hr.  Goal of Therapy:  Heparin level 0.3-0.7 units/ml  APTT 66-102s Monitor platelets by anticoagulation protocol: Yes   Plan:  Continue heparin 950 units/hr. As aPTT and anti-Xa levels now correlate, will manage patient via anti-Xa levels.   Pharmacy will continue to monitor and adjust  per consultl   Currie Paris 10/09/17 9:06 PM

## 2017-10-09 NOTE — Progress Notes (Signed)
[facility] Progess Note    SYNOPSIS   Patient transferred to the intensive care unit for worsening shortness of breath.  History of acute on chronic systolic and diastolic heart failure, renal failure briefly requiring hemodialysis, atrial fibrillation, DVT with IVC filter placed.  ASSESSMENT/PLAN   Respiratory distress requiring transfer to the intensive care unit and BiPAP.  Patient has been weaned to nasal cannula.  Still complaining of shortness of breath but no distress noted and oxygen saturations 98% on nasal cannula.  Chest x-ray performed yesterday revealed congestive heart failure.  Atrial fibrillation.  Patient has had cardioversion with recurrence of atrial fibrillation.  Being followed closely by cardiology presently being anticoagulated  Renal failure.  Patient has marginal cardiorenal status.  Too much diuresis results in renal failure, not enough results in flash pulmonary edema requiring noninvasive ventilation.  Patient also has mitral regurgitation is contributing to flash pulmonary edema.  Patient had -2 L out yesterday we will hold on any additional diuresis at this time   VENTILATOR SETTINGS: FiO2 (%):  [40 %] 40 %  INTAKE / OUTPUT:  Intake/Output Summary (Last 24 hours) at 10/09/2017 0824 Last data filed at 10/09/2017 0800 Gross per 24 hour  Intake 743.75 ml  Output 2810 ml  Net -2066.25 ml    Name: Debra Saunders MRN: [id number] DOB: Oct 23, 1945    ADMISSION DATE:  09/30/2017   SUBJECTIVE:   Patient presently on nasal cannula.  Still complaining of significant shortness of breath.  Does have a history of obstructive sleep apnea and wore BiPAP last night.  VITAL SIGNS: Temp:  [98 F (36.7 C)-98.3 F (36.8 C)] 98 F (36.7 C) (04/19 0800) Pulse Rate:  [63-96] 72 (04/19 0800) Resp:  [17-37] 24 (04/19 0800) BP: (96-157)/(68-95) 99/68 (04/19 0700) SpO2:  [97 %-100 %] 99 % (04/19 0800) FiO2 (%):  [40 %] 40 % (04/19  0200) Weight:  [240 lb 4.8 oz (109 kg)] 240 lb 4.8 oz (109 kg) (04/19 0400)   PHYSICAL EXAMINATION: Physical Examination:   VS: BP 99/68   Pulse 72   Temp 98 F (36.7 C) (Oral)   Resp (!) 24   Ht 5\' 1"  (1.549 m)   Wt 240 lb 4.8 oz (109 kg)   SpO2 99%   BMI 45.40 kg/m   General Appearance: No distress  Neuro:without focal findings, mental status normal. HEENT: PERRLA, EOM intact. Pulmonary: normal breath sounds   Cardiovascular irregularly irregular rhythm, controlled ventricular response.  Atrial fibrillation noted on monitor Abdomen: Benign, Soft, non-tender. Extremities: normal, no cyanosis, clubbing.    LABORATORY PANEL:   CBC Recent Labs  Lab 10/09/17 0207  WBC 7.9  HGB 10.0*  HCT 31.1*  PLT 220    Chemistries  Recent Labs  Lab 10/09/17 0207 10/09/17 0411  NA 138  --   K 4.4  --   CL 101  --   CO2 28  --   GLUCOSE 216*  --   BUN 46*  --   CREATININE 1.82*  --   CALCIUM 9.3  --   MG  --  1.8  PHOS  --  5.2*    Recent Labs  Lab 10/08/17 0745 10/08/17 1158 10/08/17 1658 10/08/17 1737 10/08/17 2224 10/09/17 0750  GLUCAP 148* 183* 181* 192* 181* 191*   No results for input(s): PHART, PCO2ART, PO2ART in the last 168 hours. No results for input(s): AST, ALT, ALKPHOS, BILITOT, ALBUMIN in the last 168 hours.  Cardiac Enzymes No results  for input(s): TROPONINI in the last 168 hours.  RADIOLOGY:  Dg Chest Port 1 View  Result Date: 10/08/2017 CLINICAL DATA:  SOB. Hx of A-fib, CHF, COPD, HTN, stroke, diabetes, pacemaker insertion. Former smoker. EXAM: PORTABLE CHEST 1 VIEW COMPARISON:  Chest x-ray dated 09/30/2017. FINDINGS: Stable cardiomegaly. LEFT chest wall pacemaker/ICD apparatus appears stable. Bilateral interstitial edema pattern, perhaps slightly worsened compared to the previous study. No pleural effusion or pneumothorax seen. Stable elevation of the RIGHT hemidiaphragm. IMPRESSION: Cardiomegaly with bilateral interstitial edema, consistent  with CHF/volume overload, perhaps slightly worsened compared to the previous chest x-ray of 09/30/2017. Electronically Signed   By: Franki Cabot M.D.   On: 10/08/2017 18:36   Hermelinda Dellen, DO  4/19/2019Patient ID: Debra Saunders, female   DOB: 08/17/1945, 72 y.o.   MRN: [id number]

## 2017-10-10 LAB — HEPARIN LEVEL (UNFRACTIONATED): Heparin Unfractionated: 0.5 IU/mL (ref 0.30–0.70)

## 2017-10-10 LAB — BASIC METABOLIC PANEL
Anion gap: 6 (ref 5–15)
BUN: 68 mg/dL — ABNORMAL HIGH (ref 6–20)
CO2: 29 mmol/L (ref 22–32)
Calcium: 9.3 mg/dL (ref 8.9–10.3)
Chloride: 99 mmol/L — ABNORMAL LOW (ref 101–111)
Creatinine, Ser: 2.1 mg/dL — ABNORMAL HIGH (ref 0.44–1.00)
GFR calc Af Amer: 26 mL/min — ABNORMAL LOW (ref >60)
GFR calc non Af Amer: 22 mL/min — ABNORMAL LOW (ref >60)
Glucose, Bld: 216 mg/dL — ABNORMAL HIGH (ref 65–99)
Potassium: 4.5 mmol/L (ref 3.5–5.1)
Sodium: 134 mmol/L — ABNORMAL LOW (ref 135–145)

## 2017-10-10 LAB — CBC
HCT: 30.2 % — ABNORMAL LOW (ref 35.0–47.0)
Hemoglobin: 9.8 g/dL — ABNORMAL LOW (ref 12.0–16.0)
MCH: 27.5 pg (ref 26.0–34.0)
MCHC: 32.4 g/dL (ref 32.0–36.0)
MCV: 84.9 fL (ref 80.0–100.0)
Platelets: 213 10*3/uL (ref 150–440)
RBC: 3.55 MIL/uL — ABNORMAL LOW (ref 3.80–5.20)
RDW: 17 % — ABNORMAL HIGH (ref 11.5–14.5)
WBC: 8.4 10*3/uL (ref 3.6–11.0)

## 2017-10-10 LAB — PHOSPHORUS: Phosphorus: 5.1 mg/dL — ABNORMAL HIGH (ref 2.5–4.6)

## 2017-10-10 LAB — GLUCOSE, CAPILLARY
Glucose-Capillary: 186 mg/dL — ABNORMAL HIGH (ref 65–99)
Glucose-Capillary: 272 mg/dL — ABNORMAL HIGH (ref 65–99)
Glucose-Capillary: 189 mg/dL — ABNORMAL HIGH (ref 65–99)
Glucose-Capillary: 284 mg/dL — ABNORMAL HIGH (ref 65–99)

## 2017-10-10 LAB — MAGNESIUM: Magnesium: 1.9 mg/dL (ref 1.7–2.4)

## 2017-10-10 MED ORDER — BENZONATATE 100 MG PO CAPS
100.0000 mg | ORAL_CAPSULE | Freq: Three times a day (TID) | ORAL | Status: AC
  Administered 2017-10-10 – 2017-10-14 (×15): 100 mg via ORAL
  Filled 2017-10-10 (×15): qty 1

## 2017-10-10 MED ORDER — AMIODARONE HCL 200 MG PO TABS: 400.0000 mg | ORAL_TABLET | Freq: Two times a day (BID) | ORAL | Status: DC

## 2017-10-10 MED ORDER — AMIODARONE HCL 200 MG PO TABS
200.0000 mg | ORAL_TABLET | Freq: Two times a day (BID) | ORAL | Status: DC
  Administered 2017-10-10 – 2017-10-18 (×17): 200 mg via ORAL
  Filled 2017-10-10 (×17): qty 1

## 2017-10-10 NOTE — Progress Notes (Deleted)
Patient ID: [name], female    DOB: Nov 12, 1945, 72 y.o.   MRN: [id number]  HPI  Debra Saunders is a 72 y/o female with a history of DM, hyperlipidemia, HTN, stroke, thyroid disease, COPD, obstructive sleep apnea, lymphedema, atrial fibrillation, pulmonary HTN, former tobacco use and chronic heart failure.   Echo report from 10/01/17 reviewed and showed an EF of 50-55% along with mod/severe MR, moderate TR and a severely elevated PA pressure of 69 mm Hg. TEE report from 08/20/17 reviewed and showed an EF of 35%  Admitted 09/30/17 due to                         Admitted 08/16/17 due to acute HF exacerbation. Initially needed bipap and then transitioned to 4L of oxygen on nasal cannula. Initially needed IV lasix and then transitioned to oral diuretics. Given prednisone taper for COPD exacerbation. Consulted pulmonology, EP and cardiology. Patient was cardioverted due to atrial fibrillation. Discharged after 12 days.   She presents today for a follow-up visit with a chief complaint of   Past Medical History:  Diagnosis Date  . Arthritis   . Atrial fibrillation, persistent ([place])    a. s/p TEE-guided DCCV on 08/20/2017  . Chest pain    a. 01/2012 Cath: nonobs dzs;  c. 04/2014 MV: No ischemia.  . Chronic back pain   . Chronic combined systolic (congestive) and diastolic (congestive) heart failure ([place])    a. Dx 2005 @ [facility];  b. 02/2014 Echo: Ef 55-60%, no rwma, mild MR, PASP 42mmHg. c. 07/2017: EF 30-35%, diffuse HK, trivial AI, severe MR, moderate TR  . Chronic respiratory failure (HCC)    a. on home O2  . COPD (chronic obstructive pulmonary disease) ([place])    a. Home O2 - 3lpm  . Gastritis   . History of intracranial hemorrhage    a. 6/14 described by neurosurgery as "small frontal posttraumatic SAH " - pt was on xarelto @ the time.  . Hyperlipidemia   . Hypertension   . Lipoma of back   . Lymphedema    a. Chronic LLE edema.  . Obesity   . Sepsis with metabolic encephalopathy ([place])   .  Sleep apnea   . Streptococcal bacteremia   . Stroke ([place])   . Symptomatic bradycardia    a. s/p BSX PPM.  . Thyroid nodule   . Type II diabetes mellitus ([place])   . Urinary incontinence    Past Surgical History:  Procedure Laterality Date  . ABDOMINAL HYSTERECTOMY  1969  . CARDIAC CATHETERIZATION  2013   @ [facility]: No obstructive CAD: Only 20% ostial left circumflex.  . CHOLECYSTECTOMY    . CYST REMOVAL TRUNK  2002   BACK  . DIALYSIS/PERMA CATHETER INSERTION N/A 10/06/2017   Procedure: DIALYSIS/PERMA CATHETER INSERTION;  Surgeon: Katha Cabal, MD;  Location: [facility] CV LAB;  Service: Cardiovascular;  Laterality: N/A;  . INSERT / REPLACE / REMOVE PACEMAKER    . lipoma removal  2014   back  . PACEMAKER INSERTION  [address] Scientific dual chamber pacemaker implanted by Dr Caryl Comes for symptomatic bradycardia  . PERMANENT PACEMAKER INSERTION N/A 01/09/2014   Procedure: PERMANENT PACEMAKER INSERTION;  Surgeon: Deboraha Sprang, MD;  Location: [facility] CATH LAB;  Service: Cardiovascular;  Laterality: N/A;  . TEE WITHOUT CARDIOVERSION N/A 08/20/2017   Procedure: TRANSESOPHAGEAL ECHOCARDIOGRAM (TEE) & Direct current cardioversion;  Surgeon: Minna Merritts, MD;  Location: [facility]  ORS;  Service: Cardiovascular;  Laterality: N/A;  . TRIGGER FINGER RELEASE    . VAGINAL HYSTERECTOMY     Family History  Problem Relation Age of Onset  . Heart disease Mother   . Hypertension Mother   . COPD Mother        was a smoker  . Thyroid disease Mother   . Hypertension Father   . Hypertension Sister   . Thyroid disease Sister   . Hypertension Sister   . Thyroid disease Sister   . Breast cancer Maternal Aunt   . Kidney disease Neg Hx   . Bladder Cancer Neg Hx    Social History   Tobacco Use  . Smoking status: Former Smoker    Packs/day: 0.30    Years: 2.00    Pack years: 0.60    Types: Cigarettes    Last attempt to quit: 06/23/1990    Years since quitting: 27.3  . Smokeless tobacco: Never  Used  Substance Use Topics  . Alcohol use: No   Allergies  Allergen Reactions  . Aspirin Hives and Other (See Comments)    Pt states that she is unable to take because she had bleeding in her brain.    . Other Other (See Comments)    Pt states that she is unable to take blood thinners because she had bleeding in her brain.  Blood Thinners-per doctor at [facility]  . Penicillins Hives, Shortness Of Breath, Swelling and Other (See Comments)    Has patient had a PCN reaction causing immediate rash, facial/tongue/throat swelling, SOB or lightheadedness with hypotension: Yes Has patient had a PCN reaction causing severe rash involving mucus membranes or skin necrosis: No Has patient had a PCN reaction that required hospitalization No Has patient had a PCN reaction occurring within the last 10 years: No If all of the above answers are "NO", then may proceed with Cephalosporin use.     Review of Systems  Constitutional: Positive for appetite change (feel full easy) and fatigue.  HENT: Positive for facial swelling (around the eyes, worse in the mornings). Negative for congestion and sore throat.   Eyes: Negative.   Respiratory: Positive for cough (mostly at night) and shortness of breath. Negative for chest tightness.   Cardiovascular: Positive for palpitations and leg swelling. Negative for chest pain.  Gastrointestinal: Positive for abdominal distention. Negative for abdominal pain.  Endocrine: Negative.   Genitourinary: Negative.   Skin: Negative.   Allergic/Immunologic: Negative.   Neurological: Positive for dizziness and light-headedness.  Hematological: Negative for adenopathy. Does not bruise/bleed easily.  Psychiatric/Behavioral: Positive for sleep disturbance (wakes up often; wearing oxygen at 3L around the clock). Negative for dysphoric mood. The patient is not nervous/anxious.      Physical Exam  Constitutional: She is oriented to person, place, and time. She appears  well-developed and well-nourished.  HENT:  Head: Normocephalic and atraumatic.  Neck: Normal range of motion. Neck supple. No JVD present.  Cardiovascular: Normal rate. An irregular rhythm present.  Pulmonary/Chest: Effort normal. She has no wheezes. She has no rales.  Abdominal: Soft. She exhibits no distension. There is no tenderness.  Musculoskeletal: She exhibits edema (3+ pitting edema in left lower leg; 1+ pitting edema in right lower leg). She exhibits no tenderness.  Neurological: She is alert and oriented to person, place, and time.  Skin: Skin is warm and dry.  Psychiatric: She has a normal mood and affect. Her behavior is normal. Thought content normal.  Nursing note and vitals  reviewed.  Assessment & Plan:  1: Chronic heart failure with reduced ejection fraction- - NYHA class III - fluid overloaded today with lower extremity edema - weighing daily and she says that her weight has slowly risen. Reminded her to call for an overnight weight gain of >2 pounds or a weekly weight gain of >5 pounds - weight up >13 lbs since 07/01/17 - not adding salt and the importance of closely following a 2000mg  sodium diet was discussed.  - drinking 1 cup of coffee and enough water to swallow her pills daily.  -  - elevate legs when sitting for long periods of time and begin using her compression boots - saw cardiology Sharolyn Douglas) 09/30/17 - BNP on  - doubt her BP could tolerate changing her losartan to entresto  2: HTN- - BP looks good today although on the low side - saw PCP [name]) 06/15/17  - BMP on             reviewed and showed sodium 143, potassium 4.5 and GFR 61  3: Diabetes- - fasting glucose at home was  - A1c on 08/16/17 was 5.6%  4: Pulmonary HTN- - wearing oxygen at 3L around the clock - saw pulmonologist Raul Del) 08/04/17  - discuss pulmonary rehab with patient at her next appointment  5: Atrial fibrillation- - cardioverted during February hospitalization but has since  gone back into AF - currently taking apixaban, digoxin & metoprolol - scheduled for cardioversion 10/12/17  Patient did not bring her medications nor a list. Each medication was verbally reviewed with the patient and she was encouraged to bring the bottles to every visit to confirm accuracy of list.

## 2017-10-10 NOTE — Progress Notes (Signed)
Advanced care plan.  Purpose of the Encounter: CODE STATUS  Parties in Attendance:  Patient's Decision Capacity:  Subjective/Patient's story:patient with ckd, chronic respiratory failure admitted with sob and developed arf in hospital as well as acute non st mi, now in icu    Objective/Medical story I dicussed with patient regarding intubation and rescussition including cpr due to worsening of her condition, pt wants to remain full code and continue aggressive therapy   Goals of care determination: full code    CODE STATUS: full code   Time spent discussing advanced care planning: 16 minutes

## 2017-10-10 NOTE — Progress Notes (Signed)
[place] for heparin Indication: atrial fibrillation  Allergies  Allergen Reactions  . Aspirin Hives and Other (See Comments)    Pt states that she is unable to take because she had bleeding in her brain.    . Other Other (See Comments)    Pt states that she is unable to take blood thinners because she had bleeding in her brain.  Blood Thinners-per doctor at [facility]  . Penicillins Hives, Shortness Of Breath, Swelling and Other (See Comments)    Has patient had a PCN reaction causing immediate rash, facial/tongue/throat swelling, SOB or lightheadedness with hypotension: Yes Has patient had a PCN reaction causing severe rash involving mucus membranes or skin necrosis: No Has patient had a PCN reaction that required hospitalization No Has patient had a PCN reaction occurring within the last 10 years: No If all of the above answers are "NO", then may proceed with Cephalosporin use.    Patient Measurements: Height: 5\' 1"  (154.9 cm) Weight: 239 lb 10.2 oz (108.7 kg) IBW/kg (Calculated) : 47.8 Heparin Dosing Weight: 74 kg  Vital Signs: Temp: 98.1 F (36.7 C) (04/20 0200) Temp Source: Axillary (04/20 0200) BP: 104/71 (04/20 0300) Pulse Rate: 63 (04/20 0317)  Labs: Recent Labs    10/07/17 7322  10/07/17 2235 10/08/17 0355 10/09/17 0207 10/09/17 0834 10/09/17 1420 10/10/17 0359  HGB 9.7*  --   --  9.2* 10.0*  --   --  9.8*  HCT 30.2*  --   --  28.9* 31.1*  --   --  30.2*  PLT 192  --   --  199 220  --   --  213  APTT 122*   < > 77* 84* 72*  --   --   --   HEPARINUNFRC 1.53*  --   --  0.77* 0.63  --   --  0.50  CREATININE 3.76*  --   --   --  1.82*  --   --  2.10*  TROPONINI  --   --   --   --   --  2.70* 2.35*  --    < > = values in this interval not displayed.    Estimated Creatinine Clearance: 26.8 mL/min (A) (by C-G formula based on SCr of 2.1 mg/dL (H)).   Medical History: Past Medical History:  Diagnosis Date  . Arthritis    . Atrial fibrillation, persistent ([place])    a. s/p TEE-guided DCCV on 08/20/2017  . Chest pain    a. 01/2012 Cath: nonobs dzs;  c. 04/2014 MV: No ischemia.  . Chronic back pain   . Chronic combined systolic (congestive) and diastolic (congestive) heart failure ([place])    a. Dx 2005 @ [facility];  b. 02/2014 Echo: Ef 55-60%, no rwma, mild MR, PASP 20mmHg. c. 07/2017: EF 30-35%, diffuse HK, trivial AI, severe MR, moderate TR  . Chronic respiratory failure (HCC)    a. on home O2  . COPD (chronic obstructive pulmonary disease) ([place])    a. Home O2 - 3lpm  . Gastritis   . History of intracranial hemorrhage    a. 6/14 described by neurosurgery as "small frontal posttraumatic SAH " - pt was on xarelto @ the time.  . Hyperlipidemia   . Hypertension   . Lipoma of back   . Lymphedema    a. Chronic LLE edema.  . Obesity   . Sepsis with metabolic encephalopathy ([place])   . Sleep apnea   .  Streptococcal bacteremia   . Stroke ([place])   . Symptomatic bradycardia    a. s/p BSX PPM.  . Thyroid nodule   . Type II diabetes mellitus ([place])   . Urinary incontinence     Medications:  Patient takes apixaban 5mg  BID at home for afib. She was initially started on apixaban 5mg  BID when she was admitted, but her dose was changed to apixaban 2.5mg  BID on 4/14 due to her increase in Scr (1.4 --> 3.1). Cardiology stated that decrease in apixaban dose was for instability of patient's renal function.   Assessment: Pharmacy consulted for heparin drip management for 72 yo female transitioned from apixaban to heparin secondary to decline in renal function. Patient with past medical history significant for atrial fibrillation and heart failure. Patient currently receiving heparin at 950 units/hr.  Goal of Therapy:  Heparin level 0.3-0.7 units/ml  APTT 66-102s Monitor platelets by anticoagulation protocol: Yes   Plan:  Continue heparin 950 units/hr. As aPTT and anti-Xa levels now correlate, will manage patient via anti-Xa  levels.   Pharmacy will continue to monitor and adjust per consultl   4/20 AM heparin level 0.50. Continue current regimen. Recheck heparin level and CBC with tomorrow AM labs.  Currie Paris 10/10/17 5:18 AM

## 2017-10-10 NOTE — Progress Notes (Signed)
[facility]  ROUNDING NOTE   Subjective:  Urine output has dropped off significantly over the preceding 24 hours. Urine output was 535 cc for 24 hours. Creatinine currently 2.1. Troponin elevation also noted.  Objective:  Vital signs in last 24 hours:  Temp:  [97.5 F (36.4 C)-98.8 F (37.1 C)] 98.1 F (36.7 C) (04/20 0200) Pulse Rate:  [31-124] 124 (04/20 0600) Resp:  [13-28] 18 (04/20 0600) BP: (91-115)/(31-82) 107/76 (04/20 0600) SpO2:  [95 %-100 %] 96 % (04/20 0600) FiO2 (%):  [40 %] 40 % (04/20 0200) Weight:  [108.7 kg (239 lb 10.2 oz)] 108.7 kg (239 lb 10.2 oz) (04/20 0420)  Weight change: -0.3 kg (-10.6 oz) Filed Weights   10/08/17 0335 10/09/17 0400 10/10/17 0420  Weight: 109.9 kg (242 lb 4.8 oz) 109 kg (240 lb 4.8 oz) 108.7 kg (239 lb 10.2 oz)    Intake/Output: I/O last 3 completed shifts: In: 857.8 [I.V.:857.8] Out: 3035 [Urine:3035]   Intake/Output this shift:  Total I/O In: 123.5 [I.V.:123.5] Out: 260 [Urine:260]  Physical Exam: General: Critically ill-appearing  Head: Normocephalic, atraumatic. Bipap facemask on  Eyes: Anicteric  Neck: Supple, trachea midline  Lungs:  Scattered rhonchi, on bipap  Heart: S1S2 no rubs  Abdomen:  Soft, nontender, bowel sounds present  Extremities: 1+ peripheral edema.  Neurologic: Awake, alert, following commands  Skin: No lesions       Basic Metabolic Panel: Recent Labs  Lab 10/05/17 0748 10/06/17 0020 10/06/17 1012 10/07/17 0608 10/09/17 0207 10/09/17 0411 10/10/17 0359  NA 131* 125*  --  135 138  --  134*  K 4.7 5.6*  --  4.5 4.4  --  4.5  CL 91* 88*  --  98* 101  --  99*  CO2 28 26  --  28 28  --  29  GLUCOSE 236* 381*  --  128* 216*  --  216*  BUN 71* 83*  --  76* 46*  --  68*  CREATININE 4.13* 4.65*  --  3.76* 1.82*  --  2.10*  CALCIUM 7.9* 7.8*  --  8.3* 9.3  --  9.3  MG  --   --   --   --   --  1.8  --   PHOS  --   --  5.5* 5.3*  --  5.2*  --     Liver Function Tests: No  results for input(s): AST, ALT, ALKPHOS, BILITOT, PROT, ALBUMIN in the last 168 hours. No results for input(s): LIPASE, AMYLASE in the last 168 hours. No results for input(s): AMMONIA in the last 168 hours.  CBC: Recent Labs  Lab 10/06/17 1012 10/07/17 0608 10/08/17 0355 10/09/17 0207 10/10/17 0359  WBC  --  7.9 7.6 7.9 8.4  HGB 9.7* 9.7* 9.2* 10.0* 9.8*  HCT  --  30.2* 28.9* 31.1* 30.2*  MCV  --  84.7 85.5 84.6 84.9  PLT  --  192 199 220 213    Cardiac Enzymes: Recent Labs  Lab 10/09/17 0834 10/09/17 1420  TROPONINI 2.70* 2.35*    BNP: Invalid input(s): POCBNP  CBG: Recent Labs  Lab 10/08/17 2224 10/09/17 0750 10/09/17 1144 10/09/17 1629 10/09/17 2113  GLUCAP 181* 191* 227* 257* 253*    Microbiology: Results for orders placed or performed during the hospital encounter of 09/30/17  MRSA PCR Screening     Status: Abnormal   Collection Time: 10/01/17  9:37 AM  Result Value Ref Range Status   MRSA by PCR POSITIVE (A)  NEGATIVE Final    Comment:        [name] (FDA approved for NASAL specimens only), is one component of a comprehensive MRSA colonization surveillance program. It is not intended to diagnose MRSA infection nor to guide or monitor treatment for MRSA infections. RESULT CALLED TO, READ BACK BY AND VERIFIED WITH:  Gildardo Pounds AT 1207 10/01/17 SDR Performed at [facility], [address] [place], [place] [address]     Coagulation Studies: No results for input(s): LABPROT, INR in the last 72 hours.  Urinalysis: No results for input(s): COLORURINE, LABSPEC, PHURINE, GLUCOSEU, HGBUR, BILIRUBINUR, KETONESUR, PROTEINUR, UROBILINOGEN, NITRITE, LEUKOCYTESUR in the last 72 hours.  Invalid input(s): APPERANCEUR    Imaging: Dg Chest Port 1 View  Result Date: 10/08/2017 CLINICAL DATA:  SOB. Hx of A-fib, CHF, COPD, HTN, stroke, diabetes, pacemaker insertion. Former smoker. EXAM: PORTABLE CHEST 1 VIEW COMPARISON:  Chest  x-ray dated 09/30/2017. FINDINGS: Stable cardiomegaly. LEFT chest wall pacemaker/ICD apparatus appears stable. Bilateral interstitial edema pattern, perhaps slightly worsened compared to the previous study. No pleural effusion or pneumothorax seen. Stable elevation of the RIGHT hemidiaphragm. IMPRESSION: Cardiomegaly with bilateral interstitial edema, consistent with CHF/volume overload, perhaps slightly worsened compared to the previous chest x-ray of 09/30/2017. Electronically Signed   By: Franki Cabot M.D.   On: 10/08/2017 18:36     Medications:   . sodium chloride    . heparin 950 Units/hr (10/10/17 0030)   . amiodarone  200 mg Oral BID  . benzonatate  100 mg Oral TID  . budesonide (PULMICORT) nebulizer solution  0.5 mg Nebulization BID  . chlorhexidine  15 mL Mouth Rinse BID  . doxycycline  100 mg Oral Q12H  . feeding supplement  1 Container Oral BID BM  . gabapentin  300 mg Oral QHS  . insulin aspart  0-15 Units Subcutaneous TID WC  . insulin aspart  0-5 Units Subcutaneous QHS  . ipratropium-albuterol  3 mL Nebulization TID  . mouth rinse  15 mL Mouth Rinse q12n4p  . methylPREDNISolone (SOLU-MEDROL) injection  40 mg Intravenous Q12H  . multivitamin with minerals  1 tablet Oral Daily  . mupirocin ointment   Nasal BID  . rosuvastatin  10 mg Oral Daily  . sildenafil  20 mg Oral TID  . sodium chloride flush  3 mL Intravenous Q12H   sodium chloride, acetaminophen, ALPRAZolam, ipratropium-albuterol, menthol-cetylpyridinium, nitroGLYCERIN, ondansetron (ZOFRAN) IV, ondansetron, promethazine, sodium chloride flush  Assessment/ Plan:  72 y.o. female with a PMHx of chronic diastolic heart failure, atrial fibrillation, chronic hypercarbic respiratory failure, COPD, morbid obesity, hyperlipidemia, diabetes mellitus type 2, chronic kidney disease stage II, history of intracranial hemorrhage who was admitted to [facility] on 09/30/2017 for evaluation of shortness of breath.   1.  Acute renal  failure/chronic kidney disease stage II.  Acute renal failure appears to be multifactorial.  Contributions from over diuresis as well as relative hypotension.  Patient underwent dialysis x 2 sessions.  -Renal function worse over the preceding 24 hours.  Urine output was only 535 cc for 24 hours.  Creatinine up to 2.1.  She also has an elevated troponin now.  Patient did require dialysis earlier in the admission.  No urgent indication to restart however we may need to consider this if renal function deteriorates again.  2.  Anemia of chronic kidney disease.    Hemoglobin 9.8.  No indication for Procrit at the moment.  3.  Hyperkalemia.  Serum potassium acceptable at 4.5 but  we will need to monitor as renal function appears to be worsening at the moment.  4.  Hyponatremia.  Sodium slightly low this a.m. at 134.  Continue to monitor.    LOS: [place] 4/20/20196:40 AM

## 2017-10-10 NOTE — Progress Notes (Signed)
Progress Note  Patient Name: Debra Saunders Date of Encounter: 10/10/2017  Primary Cardiologist: [name], MD  Subjective   She had acute respiratory distress last evening and did not respond very well to BiPAP and thus was transferred to the ICU.  Chest x-ray showed pulmonary edema with cardiomegaly.  She was given 1 dose of IV furosemide 80 mg with subsequent improvement.    She has no shortness of breath today.  Abdomen is better.  She recalls that prior to her respiratory event she had recurrence of her right-sided chest pain.  She has noted this before with exertion.  She had thought that perhaps it was not her heart as it was right sided  Inpatient Medications    Scheduled Meds: . amiodarone  200 mg Oral BID  . benzonatate  100 mg Oral TID  . budesonide (PULMICORT) nebulizer solution  0.5 mg Nebulization BID  . chlorhexidine  15 mL Mouth Rinse BID  . doxycycline  100 mg Oral Q12H  . feeding supplement  1 Container Oral BID BM  . gabapentin  300 mg Oral QHS  . insulin aspart  0-15 Units Subcutaneous TID WC  . insulin aspart  0-5 Units Subcutaneous QHS  . ipratropium-albuterol  3 mL Nebulization TID  . mouth rinse  15 mL Mouth Rinse q12n4p  . methylPREDNISolone (SOLU-MEDROL) injection  40 mg Intravenous Q12H  . multivitamin with minerals  1 tablet Oral Daily  . mupirocin ointment   Nasal BID  . rosuvastatin  10 mg Oral Daily  . sildenafil  20 mg Oral TID  . sodium chloride flush  3 mL Intravenous Q12H   Continuous Infusions: . sodium chloride    . heparin 950 Units/hr (10/10/17 0030)   PRN Meds: sodium chloride, acetaminophen, ALPRAZolam, ipratropium-albuterol, menthol-cetylpyridinium, nitroGLYCERIN, ondansetron (ZOFRAN) IV, ondansetron, promethazine, sodium chloride flush   Vital Signs    Vitals:   10/10/17 1100 10/10/17 1200 10/10/17 1300 10/10/17 1400  BP: 112/62 (!) 118/93 98/61 116/72  Pulse: 61 68 66 67  Resp: (!) 24 15 (!) 21 20  Temp:  98.2 F  (36.8 C)    TempSrc:  Oral    SpO2: 100% 95% 100% 100%  Weight:      Height:        Intake/Output Summary (Last 24 hours) at 10/10/2017 1527 Last data filed at 10/10/2017 1310 Gross per 24 hour  Intake 237.5 ml  Output 605 ml  Net -367.5 ml   Filed Weights   10/08/17 0335 10/09/17 0400 10/10/17 0420  Weight: 242 lb 4.8 oz (109.9 kg) 240 lb 4.8 oz (109 kg) 239 lb 10.2 oz (108.7 kg)    Physical Exam   GEN: Well nourished, well developed, in no acute distress.  HEENT: Grossly normal.  Neck: Supple, obese, difficult to gauge jvp. Cardiac: IR, IR, no murmurs, rubs, or gallops. No clubbing, cyanosis.  Trace RLE edema, 1+ LLE edema.  Radials/DP/PT 1+ and equal bilaterally.  Respiratory:  Respirations regular and unlabored, clear to auscultation bilaterally. GI: Obese, Soft, nontender, nondistended, BS + x 4. MS: no deformity or atrophy. Skin: warm and dry, no rash. Neuro:  Strength and sensation are intact. Psych: AAOx3.  Normal affect.  Labs    Chemistry Recent Labs  Lab 10/07/17 0608 10/09/17 0207 10/10/17 0359  NA 135 138 134*  K 4.5 4.4 4.5  CL 98* 101 99*  CO2 28 28 29   GLUCOSE 128* 216* 216*  BUN 76* 46* 68*  CREATININE 3.76* 1.82*  2.10*  CALCIUM 8.3* 9.3 9.3  GFRNONAA 11* 26* 22*  GFRAA 13* 30* 26*  ANIONGAP 9 9 6      Hematology Recent Labs  Lab 10/08/17 0355 10/09/17 0207 10/10/17 0359  WBC 7.6 7.9 8.4  RBC 3.38* 3.67* 3.55*  HGB 9.2* 10.0* 9.8*  HCT 28.9* 31.1* 30.2*  MCV 85.5 84.6 84.9  MCH 27.4 27.2 27.5  MCHC 32.0 32.2 32.4  RDW 16.8* 16.8* 17.0*  PLT 199 220 213     Radiology    Dg Chest Port 1 View  Result Date: 10/08/2017 CLINICAL DATA:  SOB. Hx of A-fib, CHF, COPD, HTN, stroke, diabetes, pacemaker insertion. Former smoker. EXAM: PORTABLE CHEST 1 VIEW COMPARISON:  Chest x-ray dated 09/30/2017. FINDINGS: Stable cardiomegaly. LEFT chest wall pacemaker/ICD apparatus appears stable. Bilateral interstitial edema pattern, perhaps slightly  worsened compared to the previous study. No pleural effusion or pneumothorax seen. Stable elevation of the RIGHT hemidiaphragm. IMPRESSION: Cardiomegaly with bilateral interstitial edema, consistent with CHF/volume overload, perhaps slightly worsened compared to the previous chest x-ray of 09/30/2017. Electronically Signed   By: Franki Cabot M.D.   On: 10/08/2017 18:36    Telemetry     afib with controlled cventricular rate - Personally Reviewed  Cardiac Studies   ECHO: 10/01/17 - Left ventricle: The cavity size was normal. There was mild concentric hypertrophy. Systolic function was normal. The estimated ejection fraction was in the range of 50% to 55%. Wall motion was normal; there were no regional wall motion abnormalities. The study is not technically sufficient to allow evaluation of LV diastolic function. - Mitral valve: There was moderate to severe regurgitation. - Left atrium: The atrium was moderately dilated. - Right ventricle: The cavity size was mildly dilated. Wall thickness was normal. Systolic function was mildly reduced. - Right atrium: The atrium was moderately dilated. - Tricuspid valve: There was moderate regurgitation. - Pulmonary arteries: Systolic pressure was severely elevated PA peak pressure: 69 mm Hg (S).  Patient Profile     72 y.o. female with a history of nonobstructive CAD, chronic combined systolic and diastolic congestive heart failure, persistent atrial fibrilation, symptomatic bradycardia status post permanent pacemaker, frequent PVCs, chronic respiratory failure with hypoxia on home O2, COPD, hypertension, hyperlipidemia, obesity, type 2 diabetes mellitus, traumatic subarachnoid hemorrhage in the setting of Xarelto, left lower extremity DVT status post IVC filter, chronic right sided atypical chest pain, chronic left lower extremity lymphedema, prior stroke, sleep apnea, and obesity, who is being seen for the evaluation ofacute on chronic  combined heart failure in the setting of persistent atrial fibrillationat the request of Dr. Posey Pronto.  Assessment & Plan    Atrial fib persistent  Acute on chronic diastolic heart failure  Flash pulmonary edema  Mitral regurgitation moderate-severe  Pulmonary arterial hypertension  NSTEMI  Pacemaker-[employer]  She had flash pulmonary edema the other day.  Troponins were moderately elevated 0.04--2.35;   this suggests underlying coronary artery disease.  This may be the primary issue.  She is currently scheduled for cardioversion on Monday.  We will keep her n.p.o. but it may well be that she would benefit for some catheterization.  Timing will need to be determined by recovery of renal function   Her history of preceding exertional chest discomfort makes the pretest likelihood of significant coronary disease as a potential trigger for her flash pulmonary edema much higher.  Would hold on the cardioversion.  More than 50% of 35 min was spent in counseling related to the above  Signed, Virl Axe, MD  10/10/2017, 3:27 PM    For questions or updates, please contact   Please consult [email or web address] for contact info under Cardiology/STEMI.

## 2017-10-10 NOTE — Progress Notes (Addendum)
[facility] Progess Note    SYNOPSIS   Patient transferred to the intensive care unit for worsening shortness of breath.  History of acute on chronic systolic and diastolic heart failure, renal failure briefly requiring hemodialysis, atrial fibrillation, DVT with IVC filter placed.  ASSESSMENT/PLAN   Respiratory distress requiring transfer to the intensive care unit and BiPAP.  Patient has been weaned to nasal cannula.  Dyspnea improved, no distress noted and oxygen saturations 98% on nasal cannula.   Atrial fibrillation.  Patient has had cardioversion with recurrence of atrial fibrillation.  Patient appears to intermittently be in sinus bradycardia and it goes below 60 ventricular pacemaker.  Renal failure.  Patient has marginal cardiorenal status.  Too much diuresis results in renal failure. -297 yesterday Her BUN this morning was 68 and creatinine 2.1. We'll hold on any diuresis today and try to match inputs and outputs.  Patient may be able to be transferred to the floor later on this afternoon she stays stable  VENTILATOR SETTINGS: FiO2 (%):  [40 %] 40 %  INTAKE / OUTPUT:  Intake/Output Summary (Last 24 hours) at 10/10/2017 0849 Last data filed at 10/10/2017 0600 Gross per 24 hour  Intake 237.5 ml  Output 485 ml  Net -247.5 ml    Name: Debra Saunders MRN: [id number] DOB: May 31, 1946    ADMISSION DATE:  09/30/2017   SUBJECTIVE:   Patient presently on nasal cannula.  Still complaining of significant shortness of breath.  Does have a history of obstructive sleep apnea and wore BiPAP last night.  VITAL SIGNS: Temp:  [97.5 F (36.4 C)-98.8 F (37.1 C)] 97.9 F (36.6 C) (04/20 0747) Pulse Rate:  [31-124] 71 (04/20 0800) Resp:  [13-28] 21 (04/20 0800) BP: (91-126)/(31-82) 126/66 (04/20 0800) SpO2:  [95 %-100 %] 98 % (04/20 0800) FiO2 (%):  [40 %] 40 % (04/20 0200) Weight:  [239 lb 10.2 oz (108.7 kg)] 239 lb 10.2 oz (108.7 kg) (04/20  0420)   PHYSICAL EXAMINATION: Physical Examination:   VS: BP 126/66   Pulse 71   Temp 97.9 F (36.6 C) (Oral)   Resp (!) 21   Ht 5\' 1"  (1.549 m)   Wt 239 lb 10.2 oz (108.7 kg)   SpO2 98%   BMI 45.28 kg/m   General Appearance: No distress  Neuro:without focal findings, mental status normal. HEENT: PERRLA, EOM intact. Pulmonary: normal breath sounds   Cardiovascular irregularly irregular rhythm, controlled ventricular response.  Atrial fibrillation noted on monitor Abdomen: Benign, Soft, non-tender. Extremities: normal, no cyanosis, clubbing.    LABORATORY PANEL:   CBC Recent Labs  Lab 10/10/17 0359  WBC 8.4  HGB 9.8*  HCT 30.2*  PLT 213    Chemistries  Recent Labs  Lab 10/10/17 0359  NA 134*  K 4.5  CL 99*  CO2 29  GLUCOSE 216*  BUN 68*  CREATININE 2.10*  CALCIUM 9.3  MG 1.9  PHOS 5.1*    Recent Labs  Lab 10/08/17 2224 10/09/17 0750 10/09/17 1144 10/09/17 1629 10/09/17 2113 10/10/17 0740  GLUCAP 181* 191* 227* 257* 253* 186*   No results for input(s): PHART, PCO2ART, PO2ART in the last 168 hours. No results for input(s): AST, ALT, ALKPHOS, BILITOT, ALBUMIN in the last 168 hours.  Cardiac Enzymes Recent Labs  Lab 10/09/17 1420  TROPONINI 2.35*    RADIOLOGY:  Dg Chest Port 1 View  Result Date: 10/08/2017 CLINICAL DATA:  SOB. Hx of A-fib, CHF, COPD, HTN, stroke, diabetes, pacemaker insertion. Former  smoker. EXAM: PORTABLE CHEST 1 VIEW COMPARISON:  Chest x-ray dated 09/30/2017. FINDINGS: Stable cardiomegaly. LEFT chest wall pacemaker/ICD apparatus appears stable. Bilateral interstitial edema pattern, perhaps slightly worsened compared to the previous study. No pleural effusion or pneumothorax seen. Stable elevation of the RIGHT hemidiaphragm. IMPRESSION: Cardiomegaly with bilateral interstitial edema, consistent with CHF/volume overload, perhaps slightly worsened compared to the previous chest x-ray of 09/30/2017. Electronically Signed   By:  Franki Cabot M.D.   On: 10/08/2017 18:36   Hermelinda Dellen, DO  4/20/2019Patient ID: Debra Saunders, female   DOB: [birth year], 72 y.o.   MRN: [id number] Patient ID: Debra Saunders, female   DOB: 03-17-1946, 72 y.o.   MRN: [id number]

## 2017-10-10 NOTE — Progress Notes (Signed)
Patient ID: Debra Saunders, female   DOB: 1945-12-14, 72 y.o.   MRN: [id number]   [place] PROGRESS NOTE  Debra Saunders FAO:[id number] DOB: September 03, 1945 DOA: 09/30/2017 PCP: [name], MD  HPI/Subjective: Patient doing better shortness of breath improved Patient noted to have a non-ST MI  Objective: Vitals:   10/10/17 1300 10/10/17 1400  BP: 98/61 116/72  Pulse: 66 67  Resp: (!) 21 20  Temp:    SpO2: 100% 100%    Filed Weights   10/08/17 0335 10/09/17 0400 10/10/17 0420  Weight: 109.9 kg (242 lb 4.8 oz) 109 kg (240 lb 4.8 oz) 108.7 kg (239 lb 10.2 oz)     CONSTITUTIONAL: No documented fever. No fatigue, weakness. No weight gain, no weight loss.  EYES: No blurry or double vision.  ENT: No tinnitus. No postnasal drip. No redness of the oropharynx.  RESPIRATORY: No cough, no wheeze, no hemoptysis. No dyspnea.  CARDIOVASCULAR: No chest pain. No orthopnea. No palpitations. No syncope.  GASTROINTESTINAL: No nausea, no vomiting or diarrhea. No abdominal pain. No melena or hematochezia.  GENITOURINARY:  No urgency. No frequency. No dysuria. No hematuria. No obstructive symptoms. No discharge. No pain. No significant abnormal bleeding ENDOCRINE: No polyuria or nocturia. No heat or cold intolerance.  HEMATOLOGY: No anemia. No bruising. No bleeding. No purpura. No petechiae INTEGUMENTARY: No rashes. No lesions.  MUSCULOSKELETAL: No arthritis. No swelling. No gout.  NEUROLOGIC: No numbness, tingling, or ataxia. No seizure-type activity.  PSYCHIATRIC: No anxiety. No insomnia. No ADD.     Exam: Physical Exam  Constitutional: She is oriented to person, place, and time.  HENT:  Nose: No mucosal edema.  Mouth/Throat: No oropharyngeal exudate.  Eyes: Pupils are equal, round, and reactive to light. Conjunctivae, EOM and lids are normal.  Neck: No JVD present. Carotid bruit is not present. No edema present. No thyroid mass and no thyromegaly present.  Cardiovascular: S1  normal and S2 normal. An irregularly irregular rhythm present. Exam reveals no gallop.  No murmur heard. Pulses:      Dorsalis pedis pulses are 2+ on the right side, and 2+ on the left side.  Respiratory: Accessory muscle usage present. She is in respiratory distress. She has decreased breath sounds in the right middle field, the right lower field, the left middle field and the left lower field. She has no wheezes. She has rhonchi in the right middle field and the left middle field. She has rales in the right lower field and the left lower field.  GI: Soft. Bowel sounds are normal. There is no tenderness.  Musculoskeletal:       Right ankle: She exhibits swelling.       Left ankle: She exhibits swelling.  Lymphadenopathy:    She has no cervical adenopathy.  Neurological: She is alert and oriented to person, place, and time. No cranial nerve deficit.  Skin: Skin is warm. No rash noted. Nails show no clubbing.  Psychiatric: She has a normal mood and affect.      Data Reviewed: Basic Metabolic Panel: Recent Labs  Lab 10/05/17 0748 10/06/17 0020 10/06/17 1012 10/07/17 0608 10/09/17 0207 10/09/17 0411 10/10/17 0359  NA 131* 125*  --  135 138  --  134*  K 4.7 5.6*  --  4.5 4.4  --  4.5  CL 91* 88*  --  98* 101  --  99*  CO2 28 26  --  28 28  --  29  GLUCOSE 236* 381*  --  128* 216*  --  216*  BUN 71* 83*  --  76* 46*  --  68*  CREATININE 4.13* 4.65*  --  3.76* 1.82*  --  2.10*  CALCIUM 7.9* 7.8*  --  8.3* 9.3  --  9.3  MG  --   --   --   --   --  1.8 1.9  PHOS  --   --  5.5* 5.3*  --  5.2* 5.1*   Acute kidney injury CBC: Recent Labs  Lab 10/06/17 1012 10/07/17 0608 10/08/17 0355 10/09/17 0207 10/10/17 0359  WBC  --  7.9 7.6 7.9 8.4  HGB 9.7* 9.7* 9.2* 10.0* 9.8*  HCT  --  30.2* 28.9* 31.1* 30.2*  MCV  --  84.7 85.5 84.6 84.9  PLT  --  192 199 220 213   BNP (last 3 results) Recent Labs    08/04/17 1552 08/16/17 0925 09/30/17 1545  BNP 175.0* 655.0* 1,151.0*      CBG: Recent Labs  Lab 10/09/17 1144 10/09/17 1629 10/09/17 2113 10/10/17 0740 10/10/17 1131  GLUCAP 227* 257* 253* 186* 272*    Recent Results (from the past 240 hour(s))  MRSA PCR Screening     Status: Abnormal   Collection Time: 10/01/17  9:37 AM  Result Value Ref Range Status   MRSA by PCR POSITIVE (A) NEGATIVE Final    Comment:        [name] (FDA approved for NASAL specimens only), is one component of a comprehensive MRSA colonization surveillance program. It is not intended to diagnose MRSA infection nor to guide or monitor treatment for MRSA infections. RESULT CALLED TO, READ BACK BY AND VERIFIED WITH:  Gildardo Pounds AT 1207 10/01/17 SDR Performed at [facility], [address] [place], [place] [address]       Scheduled Meds: . amiodarone  200 mg Oral BID  . benzonatate  100 mg Oral TID  . budesonide (PULMICORT) nebulizer solution  0.5 mg Nebulization BID  . chlorhexidine  15 mL Mouth Rinse BID  . doxycycline  100 mg Oral Q12H  . feeding supplement  1 Container Oral BID BM  . gabapentin  300 mg Oral QHS  . insulin aspart  0-15 Units Subcutaneous TID WC  . insulin aspart  0-5 Units Subcutaneous QHS  . ipratropium-albuterol  3 mL Nebulization TID  . mouth rinse  15 mL Mouth Rinse q12n4p  . methylPREDNISolone (SOLU-MEDROL) injection  40 mg Intravenous Q12H  . multivitamin with minerals  1 tablet Oral Daily  . mupirocin ointment   Nasal BID  . rosuvastatin  10 mg Oral Daily  . sildenafil  20 mg Oral TID  . sodium chloride flush  3 mL Intravenous Q12H   Continuous Infusions: . sodium chloride    . heparin 950 Units/hr (10/10/17 0030)    Assessment/Plan:  1. Acute respiratory failure in a setting of acute on chronic diastolic CHF with flash pulmonary edema , patient's respiratory status improved 2. Acute kidney injury on CKD stage 3.  Likely over diuresis and hypotension .  Renal function  stable nephrology  following 3. Acute non-ST MI continue IV heparin  4. acute on chronic diastolic congestive heart failure.  Patient has moderate to severe mitral valve regurgitation.   Holding all medications secondary to hypotension and acute kidney injury.   5. Chronic atrial fibrillation continue on heparin drip and amiodarone.  6. Chronic hypoxic hypercarbic respiratory failure.  Chronically on 4 L of oxygen. 7.  Morbid obesity.  Weight loss needed 8. COPD exacerbation, continue nebulizer treatments.   taper prednisone to off.  Since lungs sound better this could be more COPD rather than heart failure.  9. Hyperlipidemia unspecified on Crestor 10. Type 2 diabetes mellitus on sliding scale insulin 11. Upper respiratory tract infection on oral doxycycline 12. Hyperlipidemia unspecified on Crestor 13. No further epistaxsis 14. No further rectal bleeding    Code Status:     Code Status Orders  (From admission, onward)        Start     Ordered   09/30/17 1901  Full code  Continuous     09/30/17 1901    Code Status History    Date Active Date Inactive Code Status Order ID Comments User Context   08/16/2017 3846 08/28/2017 2239 Full Code [id number]  Gorden Harms, MD Inpatient   03/30/2015 2152 04/03/2015 2122 Full Code [id number]  Etta Quill, DO Inpatient   03/27/2015 2208 03/28/2015 1737 Full Code [id number]  Nicholes Mango, MD ED   01/09/2014 1418 01/10/2014 1803 Full Code [id number]  Deboraha Sprang, MD Inpatient   01/16/2013 2335 01/27/2013 2011 Full Code [id number]  Collene Gobble, MD Inpatient     Disposition Plan:  TBD  Consultants:  Cardiology  Time spent: 35 minutes, case discussed with daughter carmen on phone, critical care specialist and nephrology  [facility]

## 2017-10-11 LAB — CBC
HCT: 30.5 % — ABNORMAL LOW (ref 35.0–47.0)
Hemoglobin: 9.8 g/dL — ABNORMAL LOW (ref 12.0–16.0)
MCH: 27.3 pg (ref 26.0–34.0)
MCHC: 32.2 g/dL (ref 32.0–36.0)
MCV: 85 fL (ref 80.0–100.0)
Platelets: 206 10*3/uL (ref 150–440)
RBC: 3.59 MIL/uL — ABNORMAL LOW (ref 3.80–5.20)
RDW: 16.7 % — ABNORMAL HIGH (ref 11.5–14.5)
WBC: 10.1 10*3/uL (ref 3.6–11.0)

## 2017-10-11 LAB — GLUCOSE, CAPILLARY
Glucose-Capillary: 175 mg/dL — ABNORMAL HIGH (ref 65–99)
Glucose-Capillary: 354 mg/dL — ABNORMAL HIGH (ref 65–99)
Glucose-Capillary: 325 mg/dL — ABNORMAL HIGH (ref 65–99)
Glucose-Capillary: 237 mg/dL — ABNORMAL HIGH (ref 65–99)

## 2017-10-11 LAB — BASIC METABOLIC PANEL
Anion gap: 8 (ref 5–15)
BUN: 82 mg/dL — ABNORMAL HIGH (ref 6–20)
CO2: 27 mmol/L (ref 22–32)
Calcium: 9.1 mg/dL (ref 8.9–10.3)
Chloride: 99 mmol/L — ABNORMAL LOW (ref 101–111)
Creatinine, Ser: 2.57 mg/dL — ABNORMAL HIGH (ref 0.44–1.00)
GFR calc Af Amer: 20 mL/min — ABNORMAL LOW (ref >60)
GFR calc non Af Amer: 17 mL/min — ABNORMAL LOW (ref >60)
Glucose, Bld: 206 mg/dL — ABNORMAL HIGH (ref 65–99)
Potassium: 4 mmol/L (ref 3.5–5.1)
Sodium: 134 mmol/L — ABNORMAL LOW (ref 135–145)

## 2017-10-11 LAB — HEPARIN LEVEL (UNFRACTIONATED): Heparin Unfractionated: 0.51 IU/mL (ref 0.30–0.70)

## 2017-10-11 MED ORDER — CLOPIDOGREL BISULFATE 75 MG PO TABS
75.0000 mg | ORAL_TABLET | Freq: Every day | ORAL | Status: DC
  Administered 2017-10-11 – 2017-10-19 (×9): 75 mg via ORAL
  Filled 2017-10-11 (×9): qty 1

## 2017-10-11 NOTE — Progress Notes (Signed)
[place] for heparin Indication: atrial fibrillation  Allergies  Allergen Reactions  . Aspirin Hives and Other (See Comments)    Pt states that she is unable to take because she had bleeding in her brain.    . Other Other (See Comments)    Pt states that she is unable to take blood thinners because she had bleeding in her brain.  Blood Thinners-per doctor at [facility]  . Penicillins Hives, Shortness Of Breath, Swelling and Other (See Comments)    Has patient had a PCN reaction causing immediate rash, facial/tongue/throat swelling, SOB or lightheadedness with hypotension: Yes Has patient had a PCN reaction causing severe rash involving mucus membranes or skin necrosis: No Has patient had a PCN reaction that required hospitalization No Has patient had a PCN reaction occurring within the last 10 years: No If all of the above answers are "NO", then may proceed with Cephalosporin use.    Patient Measurements: Height: 5\' 1"  (154.9 cm) Weight: 242 lb 1 oz (109.8 kg) IBW/kg (Calculated) : 47.8 Heparin Dosing Weight: 74 kg  Vital Signs: Temp: 97.9 F (36.6 C) (04/20 2000) Temp Source: Oral (04/20 2000) BP: 106/70 (04/21 0300) Pulse Rate: 59 (04/21 0300)  Labs: Recent Labs    10/09/17 0207 10/09/17 0834 10/09/17 1420 10/10/17 0359 10/11/17 0423  HGB 10.0*  --   --  9.8* 9.8*  HCT 31.1*  --   --  30.2* 30.5*  PLT 220  --   --  213 206  APTT 72*  --   --   --   --   HEPARINUNFRC 0.63  --   --  0.50 0.51  CREATININE 1.82*  --   --  2.10* 2.57*  TROPONINI  --  2.70* 2.35*  --   --     Estimated Creatinine Clearance: 22 mL/min (A) (by C-G formula based on SCr of 2.57 mg/dL (H)).   Medical History: Past Medical History:  Diagnosis Date  . Arthritis   . Atrial fibrillation, persistent ([place])    a. s/p TEE-guided DCCV on 08/20/2017  . Chest pain    a. 01/2012 Cath: nonobs dzs;  c. 04/2014 MV: No ischemia.  . Chronic back pain   .  Chronic combined systolic (congestive) and diastolic (congestive) heart failure ([place])    a. Dx 2005 @ [facility];  b. 02/2014 Echo: Ef 55-60%, no rwma, mild MR, PASP 63mmHg. c. 07/2017: EF 30-35%, diffuse HK, trivial AI, severe MR, moderate TR  . Chronic respiratory failure (H[facility])    a. on home O2  . COPD (chronic obstructive pulmonary disease) ([place])    a. Home O2 - 3lpm  . Gastritis   . History of intracranial hemorrhage    a. 6/14 described by neurosurgery as "small frontal posttraumatic SAH " - pt was on xarelto @ the time.  . Hyperlipidemia   . Hypertension   . Lipoma of back   . Lymphedema    a. Chronic LLE edema.  . Obesity   . Sepsis with metabolic encephalopathy ([place])   . Sleep apnea   . Streptococcal bacteremia   . Stroke ([place])   . Symptomatic bradycardia    a. s/p BSX PPM.  . Thyroid nodule   . Type II diabetes mellitus ([place])   . Urinary incontinence     Medications:  Patient takes apixaban 5mg  BID at home for afib. She was initially started on apixaban 5mg  BID when she was admitted, but her dose  was changed to apixaban 2.5mg  BID on 4/14 due to her increase in Scr (1.4 --> 3.1). Cardiology stated that decrease in apixaban dose was for instability of patient's renal function.   Assessment: Pharmacy consulted for heparin drip management for 72 yo female transitioned from apixaban to heparin secondary to decline in renal function. Patient with past medical history significant for atrial fibrillation and heart failure. Patient currently receiving heparin at 950 units/hr.  Goal of Therapy:  Heparin level 0.3-0.7 units/ml  APTT 66-102s Monitor platelets by anticoagulation protocol: Yes   Plan:  Continue heparin 950 units/hr. As aPTT and anti-Xa levels now correlate, will manage patient via anti-Xa levels.   Pharmacy will continue to monitor and adjust per consultl   4/20 AM heparin level 0.50. Continue current regimen. Recheck heparin level and CBC with tomorrow AM labs.  4/21  AM heparin level 0.51. Continue current regimen. Recheck heparin level and CBC with tomorrow AM labs.  Sim Boast, PharmD, BCPS  10/11/17 5:47 AM

## 2017-10-11 NOTE — Progress Notes (Signed)
Pt being transferred to room 239. Report called to [place], Therapist, sports. Pt and belongings transferred to room 239 without incident.

## 2017-10-11 NOTE — Progress Notes (Signed)
[facility]  ROUNDING NOTE   Subjective:  Patient still having significant oliguria. Urine output was only 380 cc over the preceding 24 hours. Patient develops acute renal failure very easily with diuresis.  Objective:  Vital signs in last 24 hours:  Temp:  [97.9 F (36.6 C)-98 F (36.7 C)] 97.9 F (36.6 C) (04/21 1200) Pulse Rate:  [59-131] 69 (04/21 1200) Resp:  [14-23] 21 (04/21 1200) BP: (87-122)/(60-87) 100/64 (04/21 1200) SpO2:  [92 %-100 %] 99 % (04/21 1200) FiO2 (%):  [36 %] 36 % (04/20 1921) Weight:  [109.8 kg (242 lb 1 oz)] 109.8 kg (242 lb 1 oz) (04/21 0410)  Weight change: 1.1 kg (2 lb 6.8 oz) Filed Weights   10/09/17 0400 10/10/17 0420 10/11/17 0410  Weight: 109 kg (240 lb 4.8 oz) 108.7 kg (239 lb 10.2 oz) 109.8 kg (242 lb 1 oz)    Intake/Output: I/O last 3 completed shifts: In: 218.5 [I.V.:218.5] Out: 640 [Urine:640]   Intake/Output this shift:  Total I/O In: 190 [I.V.:190] Out: 160 [Urine:160]  Physical Exam: General:  No acute distress  Head: Normocephalic, atraumatic.   Eyes: Anicteric  Neck: Supple, trachea midline  Lungs:  Scattered rhonchi, normal effort  Heart: S1S2 no rubs  Abdomen:  Soft, nontender, bowel sounds present  Extremities: 2+ peripheral edema.  Neurologic: Awake, alert, following commands  Skin: No lesions       Basic Metabolic Panel: Recent Labs  Lab 10/06/17 0020 10/06/17 1012 10/07/17 0608 10/09/17 0207 10/09/17 0411 10/10/17 0359 10/11/17 0423  NA 125*  --  135 138  --  134* 134*  K 5.6*  --  4.5 4.4  --  4.5 4.0  CL 88*  --  98* 101  --  99* 99*  CO2 26  --  28 28  --  29 27  GLUCOSE 381*  --  128* 216*  --  216* 206*  BUN 83*  --  76* 46*  --  68* 82*  CREATININE 4.65*  --  3.76* 1.82*  --  2.10* 2.57*  CALCIUM 7.8*  --  8.3* 9.3  --  9.3 9.1  MG  --   --   --   --  1.8 1.9  --   PHOS  --  5.5* 5.3*  --  5.2* 5.1*  --     Liver Function Tests: No results for input(s): AST, ALT, ALKPHOS,  BILITOT, PROT, ALBUMIN in the last 168 hours. No results for input(s): LIPASE, AMYLASE in the last 168 hours. No results for input(s): AMMONIA in the last 168 hours.  CBC: Recent Labs  Lab 10/07/17 0608 10/08/17 0355 10/09/17 0207 10/10/17 0359 10/11/17 0423  WBC 7.9 7.6 7.9 8.4 10.1  HGB 9.7* 9.2* 10.0* 9.8* 9.8*  HCT 30.2* 28.9* 31.1* 30.2* 30.5*  MCV 84.7 85.5 84.6 84.9 85.0  PLT 192 199 220 213 206    Cardiac Enzymes: Recent Labs  Lab 10/09/17 0834 10/09/17 1420  TROPONINI 2.70* 2.35*    BNP: Invalid input(s): POCBNP  CBG: Recent Labs  Lab 10/10/17 1131 10/10/17 1618 10/10/17 2101 10/11/17 0740 10/11/17 1155  GLUCAP 272* 189* 284* 175* 354*    Microbiology: Results for orders placed or performed during the hospital encounter of 09/30/17  MRSA PCR Screening     Status: Abnormal   Collection Time: 10/01/17  9:37 AM  Result Value Ref Range Status   MRSA by PCR POSITIVE (A) NEGATIVE Final    Comment:  [name] (FDA approved for NASAL specimens only), is one component of a comprehensive MRSA colonization surveillance program. It is not intended to diagnose MRSA infection nor to guide or monitor treatment for MRSA infections. RESULT CALLED TO, READ BACK BY AND VERIFIED WITH:  Gildardo Pounds AT 1207 10/01/17 SDR Performed at [facility], [address] [place],  [address]     Coagulation Studies: No results for input(s): LABPROT, INR in the last 72 hours.  Urinalysis: No results for input(s): COLORURINE, LABSPEC, PHURINE, GLUCOSEU, HGBUR, BILIRUBINUR, KETONESUR, PROTEINUR, UROBILINOGEN, NITRITE, LEUKOCYTESUR in the last 72 hours.  Invalid input(s): APPERANCEUR    Imaging: No results found.   Medications:   . sodium chloride    . heparin 950 Units/hr (10/11/17 1200)   . amiodarone  200 mg Oral BID  . benzonatate  100 mg Oral TID  . budesonide (PULMICORT) nebulizer solution  0.5 mg Nebulization BID   . chlorhexidine  15 mL Mouth Rinse BID  . doxycycline  100 mg Oral Q12H  . feeding supplement  1 Container Oral BID BM  . gabapentin  300 mg Oral QHS  . insulin aspart  0-15 Units Subcutaneous TID WC  . insulin aspart  0-5 Units Subcutaneous QHS  . ipratropium-albuterol  3 mL Nebulization TID  . mouth rinse  15 mL Mouth Rinse q12n4p  . methylPREDNISolone (SOLU-MEDROL) injection  40 mg Intravenous Q12H  . multivitamin with minerals  1 tablet Oral Daily  . mupirocin ointment   Nasal BID  . rosuvastatin  10 mg Oral Daily  . sildenafil  20 mg Oral TID  . sodium chloride flush  3 mL Intravenous Q12H   sodium chloride, acetaminophen, ALPRAZolam, ipratropium-albuterol, menthol-cetylpyridinium, nitroGLYCERIN, ondansetron (ZOFRAN) IV, ondansetron, promethazine, sodium chloride flush  Assessment/ Plan:  72 y.o. female with a PMHx of chronic diastolic heart failure, atrial fibrillation, chronic hypercarbic respiratory failure, COPD, morbid obesity, hyperlipidemia, diabetes mellitus type 2, chronic kidney disease stage II, history of intracranial hemorrhage who was admitted to [facility] on 09/30/2017 for evaluation of shortness of breath.   1.  Acute renal failure/chronic kidney disease stage II.  Acute renal failure appears to be multifactorial.  Contributions from over diuresis as well as relative hypotension.  Patient underwent dialysis x 2 sessions.  -Patient has again developed oliguria.  She seems very sensitive to diuresis as she has developed acute renal failure quite easily with even minimal diuresis this admission.  Will hold off on HD for now, but may need to consider restarting HD, she would need another HD catheter placed if she redevelops the need.   2.  Anemia of chronic kidney disease.    Hgb 9.8, hold off on epogen.    3.  Hyperkalemia. K currently 4.0 and acceptable, will monitor.   4.  Hyponatremia.  Sodium slightly low this a.m. at 134.  Continue to monitor.    LOS: [place],  Deolinda Frid 4/21/201912:09 PM

## 2017-10-11 NOTE — Progress Notes (Signed)
Progress Note  Patient Name: Debra Saunders Date of Encounter: 10/11/2017  Primary Cardiologist: [name], MD  Subjective   She had acute respiratory distress last evening and did not respond very well to BiPAP and thus was transferred to the ICU.  Chest x-ray showed pulmonary edema with cardiomegaly.  She was given 1 dose of IV furosemide 80 mg with subsequent improvement.    Feels pretty good without palps or sob  Inpatient Medications    Scheduled Meds: . amiodarone  200 mg Oral BID  . benzonatate  100 mg Oral TID  . budesonide (PULMICORT) nebulizer solution  0.5 mg Nebulization BID  . chlorhexidine  15 mL Mouth Rinse BID  . doxycycline  100 mg Oral Q12H  . feeding supplement  1 Container Oral BID BM  . gabapentin  300 mg Oral QHS  . insulin aspart  0-15 Units Subcutaneous TID WC  . insulin aspart  0-5 Units Subcutaneous QHS  . ipratropium-albuterol  3 mL Nebulization TID  . mouth rinse  15 mL Mouth Rinse q12n4p  . methylPREDNISolone (SOLU-MEDROL) injection  40 mg Intravenous Q12H  . multivitamin with minerals  1 tablet Oral Daily  . mupirocin ointment   Nasal BID  . rosuvastatin  10 mg Oral Daily  . sildenafil  20 mg Oral TID  . sodium chloride flush  3 mL Intravenous Q12H   Continuous Infusions: . sodium chloride    . heparin 950 Units/hr (10/11/17 1200)   PRN Meds: sodium chloride, acetaminophen, ALPRAZolam, ipratropium-albuterol, menthol-cetylpyridinium, nitroGLYCERIN, ondansetron (ZOFRAN) IV, ondansetron, promethazine, sodium chloride flush   Vital Signs    Vitals:   10/11/17 0756 10/11/17 0800 10/11/17 0900 10/11/17 1200  BP:  104/70 116/68 100/64  Pulse:  60 67 69  Resp:  (!) 22 19 (!) 21  Temp: 97.9 F (36.6 C) 98 F (36.7 C)  97.9 F (36.6 C)  TempSrc: Oral Oral  Oral  SpO2:  98% 97% 99%  Weight:      Height:        Intake/Output Summary (Last 24 hours) at 10/11/2017 1318 Last data filed at 10/11/2017 1200 Gross per 24 hour  Intake 285  ml  Output 420 ml  Net -135 ml   Filed Weights   10/09/17 0400 10/10/17 0420 10/11/17 0410  Weight: 240 lb 4.8 oz (109 kg) 239 lb 10.2 oz (108.7 kg) 242 lb 1 oz (109.8 kg)    Physical Exam   Well developed and nourished in no acute distress HENT normal Neck supple with JVP-flat Clear Irregular rate  and rhythm, no murmurs or gallops Abd-soft with active BS No Clubbing cyanosis edema Skin-warm and dry A & Oriented  Grossly normal sensory and motor function    Labs    Chemistry Recent Labs  Lab 10/09/17 0207 10/10/17 0359 10/11/17 0423  NA 138 134* 134*  K 4.4 4.5 4.0  CL 101 99* 99*  CO2 28 29 27   GLUCOSE 216* 216* 206*  BUN 46* 68* 82*  CREATININE 1.82* 2.10* 2.57*  CALCIUM 9.3 9.3 9.1  GFRNONAA 26* 22* 17*  GFRAA 30* 26* 20*  ANIONGAP 9 6 8      Hematology Recent Labs  Lab 10/09/17 0207 10/10/17 0359 10/11/17 0423  WBC 7.9 8.4 10.1  RBC 3.67* 3.55* 3.59*  HGB 10.0* 9.8* 9.8*  HCT 31.1* 30.2* 30.5*  MCV 84.6 84.9 85.0  MCH 27.2 27.5 27.3  MCHC 32.2 32.4 32.2  RDW 16.8* 17.0* 16.7*  PLT 220 213 206  Radiology    No results found.  Telemetry    afib with CVR - Personally Reviewed  Cardiac Studies   ECHO: 10/01/17 - Left ventricle: The cavity size was normal. There was mild concentric hypertrophy. Systolic function was normal. The estimated ejection fraction was in the range of 50% to 55%. Wall motion was normal; there were no regional wall motion abnormalities. The study is not technically sufficient to allow evaluation of LV diastolic function. - Mitral valve: There was moderate to severe regurgitation. - Left atrium: The atrium was moderately dilated. - Right ventricle: The cavity size was mildly dilated. Wall thickness was normal. Systolic function was mildly reduced. - Right atrium: The atrium was moderately dilated. - Tricuspid valve: There was moderate regurgitation. - Pulmonary arteries: Systolic pressure was  severely elevated PA peak pressure: 69 mm Hg (S).  Patient Profile     72 y.o. female with a history of nonobstructive CAD, chronic combined systolic and diastolic congestive heart failure, persistent atrial fibrilation, symptomatic bradycardia status post permanent pacemaker, frequent PVCs, chronic respiratory failure with hypoxia on home O2, COPD, hypertension, hyperlipidemia, obesity, type 2 diabetes mellitus, traumatic subarachnoid hemorrhage in the setting of Xarelto, left lower extremity DVT status post IVC filter, chronic right sided atypical chest pain, chronic left lower extremity lymphedema, prior stroke, sleep apnea, and obesity, who is being seen for the evaluation ofacute on chronic combined heart failure in the setting of persistent atrial fibrillationat the request of Dr. Posey Pronto.  Assessment & Plan    Atrial fib persistent  Acute on chronic diastolic heart failure  Flash pulmonary edema  Mitral regurgitation moderate-severe  Pulmonary arterial hypertension  Acute renal insufficiency  NSTEMI  ASA allergic   Pacemaker-[employer]  She had flash pulmonary edema the other day.  Troponins were moderately elevated 0.04--2.35;   this suggests underlying coronary artery disease.  This may be the primary issue. Her history of preceding exertional chest discomfort makes the pretest likelihood of significant coronary disease as a potential trigger for her flash pulmonary edema much higher.     She is currently scheduled for cardioversion on Monday.  We will keep her n.p.o.   I am surprised that her worsening renal insufficiency.  It may be that we could improve renal function with cardioversion with restoration of sinus rhythm; it will be days before certainly she can undergo catheterization with her kidney function worsening.  Hence, could proceed with cardioversion and pursue medical therapy for her non-STEMI.  In this regard I will add clopidogrel  ( ASA allergic)    She is on steroids.  Looking through the chart, I see the transition from p.o.--IV but not the indication.    Signed, Virl Axe, MD  10/11/2017, 1:18 PM    For questions or updates, please contact   Please consult [email or web address] for contact info under Cardiology/STEMI.

## 2017-10-11 NOTE — Progress Notes (Signed)
Patient ID: Debra Saunders, female   DOB: 02/08/1946, 72 y.o.   MRN: [id number]   [place] PROGRESS NOTE  Debra Saunders BTD:[id number] DOB: [birth year] DOA: 09/30/2017 PCP: [name], MD  HPI/Subjective: Patient's breathing is improved however not making much urine renal function worse objective:   Vitals:   10/11/17 0900 10/11/17 1200  BP: 116/68 100/64  Pulse: 67 69  Resp: 19 (!) 21  Temp:  97.9 F (36.6 C)  SpO2: 97% 99%    Filed Weights   10/09/17 0400 10/10/17 0420 10/11/17 0410  Weight: 109 kg (240 lb 4.8 oz) 108.7 kg (239 lb 10.2 oz) 109.8 kg (242 lb 1 oz)     CONSTITUTIONAL: No documented fever. No fatigue, weakness. No weight gain, no weight loss.  EYES: No blurry or double vision.  ENT: No tinnitus. No postnasal drip. No redness of the oropharynx.  RESPIRATORY: No cough, no wheeze, no hemoptysis. No dyspnea.  CARDIOVASCULAR: No chest pain. No orthopnea. No palpitations. No syncope.  GASTROINTESTINAL: No nausea, no vomiting or diarrhea. No abdominal pain. No melena or hematochezia.  GENITOURINARY:  No urgency. No frequency. No dysuria. No hematuria. No obstructive symptoms. No discharge. No pain. No significant abnormal bleeding ENDOCRINE: No polyuria or nocturia. No heat or cold intolerance.  HEMATOLOGY: No anemia. No bruising. No bleeding. No purpura. No petechiae INTEGUMENTARY: No rashes. No lesions.  MUSCULOSKELETAL: No arthritis. No swelling. No gout.  NEUROLOGIC: No numbness, tingling, or ataxia. No seizure-type activity.  PSYCHIATRIC: No anxiety. No insomnia. No ADD.     Exam: Physical Exam  Constitutional: She is oriented to person, place, and time.  HENT:  Nose: No mucosal edema.  Mouth/Throat: No oropharyngeal exudate.  Eyes: Pupils are equal, round, and reactive to light. Conjunctivae, EOM and lids are normal.  Neck: No JVD present. Carotid bruit is not present. No edema present. No thyroid mass and no thyromegaly present.   Cardiovascular: S1 normal and S2 normal. An irregularly irregular rhythm present. Exam reveals no gallop.  No murmur heard. Pulses:      Dorsalis pedis pulses are 2+ on the right side, and 2+ on the left side.  Respiratory: Accessory muscle usage present. She is in respiratory distress. She has decreased breath sounds in the right middle field, the right lower field, the left middle field and the left lower field. She has no wheezes. She has rhonchi in the right middle field and the left middle field. She has rales in the right lower field and the left lower field.  GI: Soft. Bowel sounds are normal. There is no tenderness.  Musculoskeletal:       Right ankle: She exhibits swelling.       Left ankle: She exhibits swelling.  Lymphadenopathy:    She has no cervical adenopathy.  Neurological: She is alert and oriented to person, place, and time. No cranial nerve deficit.  Skin: Skin is warm. No rash noted. Nails show no clubbing.  Psychiatric: She has a normal mood and affect.      Data Reviewed: Basic Metabolic Panel: Recent Labs  Lab 10/06/17 0020 10/06/17 1012 10/07/17 1062 10/09/17 0207 10/09/17 0411 10/10/17 0359 10/11/17 0423  NA 125*  --  135 138  --  134* 134*  K 5.6*  --  4.5 4.4  --  4.5 4.0  CL 88*  --  98* 101  --  99* 99*  CO2 26  --  28 28  --  29 27  GLUCOSE 381*  --  128* 216*  --  216* 206*  BUN 83*  --  76* 46*  --  68* 82*  CREATININE 4.65*  --  3.76* 1.82*  --  2.10* 2.57*  CALCIUM 7.8*  --  8.3* 9.3  --  9.3 9.1  MG  --   --   --   --  1.8 1.9  --   PHOS  --  5.5* 5.3*  --  5.2* 5.1*  --    Acute kidney injury CBC: Recent Labs  Lab 10/07/17 0608 10/08/17 0355 10/09/17 0207 10/10/17 0359 10/11/17 0423  WBC 7.9 7.6 7.9 8.4 10.1  HGB 9.7* 9.2* 10.0* 9.8* 9.8*  HCT 30.2* 28.9* 31.1* 30.2* 30.5*  MCV 84.7 85.5 84.6 84.9 85.0  PLT 192 199 220 213 206   BNP (last 3 results) Recent Labs    08/04/17 1552 08/16/17 0925 09/30/17 1545  BNP 175.0*  655.0* 1,151.0*     CBG: Recent Labs  Lab 10/10/17 1131 10/10/17 1618 10/10/17 2101 10/11/17 0740 10/11/17 1155  GLUCAP 272* 189* 284* 175* 354*    No results found for this or any previous visit (from the past 240 hour(s)).    Scheduled Meds: . amiodarone  200 mg Oral BID  . benzonatate  100 mg Oral TID  . budesonide (PULMICORT) nebulizer solution  0.5 mg Nebulization BID  . chlorhexidine  15 mL Mouth Rinse BID  . clopidogrel  75 mg Oral Daily  . doxycycline  100 mg Oral Q12H  . feeding supplement  1 Container Oral BID BM  . gabapentin  300 mg Oral QHS  . insulin aspart  0-15 Units Subcutaneous TID WC  . insulin aspart  0-5 Units Subcutaneous QHS  . ipratropium-albuterol  3 mL Nebulization TID  . mouth rinse  15 mL Mouth Rinse q12n4p  . methylPREDNISolone (SOLU-MEDROL) injection  40 mg Intravenous Q12H  . multivitamin with minerals  1 tablet Oral Daily  . mupirocin ointment   Nasal BID  . rosuvastatin  10 mg Oral Daily  . sildenafil  20 mg Oral TID  . sodium chloride flush  3 mL Intravenous Q12H   Continuous Infusions: . sodium chloride    . heparin 950 Units/hr (10/11/17 1200)    Assessment/Plan:  1. Acute respiratory failure in a setting of acute on chronic diastolic CHF with flash pulmonary edema , patient's respiratory status improved 2. Acute kidney injury on CKD stage 3.    May need to be hemodialyzed again nephrology following 3. Acute non-ST MI continue IV heparin  4. acute on chronic diastolic congestive heart failure.  Patient has moderate to severe mitral valve regurgitation.   Holding all medications secondary to hypotension and acute kidney injury.   5. Chronic atrial fibrillation continue on heparin drip and amiodarone.  Cardioversion per cardiology 6. Chronic hypoxic hypercarbic respiratory failure.  Chronically on 4 L of oxygen. 7. Morbid obesity.  Weight loss needed 8. COPD exacerbation, continue nebulizer treatments.    9. Hyperlipidemia  unspecified on Crestor 10. Type 2 diabetes mellitus on sliding scale insulin 11. Upper respiratory tract infection on oral doxycycline 12. Hyperlipidemia unspecified on Crestor 13. No further epistaxsis 14. No further rectal bleeding    Code Status:     Code Status Orders  (From admission, onward)        Start     Ordered   09/30/17 1901  Full code  Continuous     09/30/17 1901    Code Status History  Date Active Date Inactive Code Status Order ID Comments User Context   08/16/2017 1614 08/28/2017 2239 Full Code [id number]  Gorden Harms, MD Inpatient   03/30/2015 2152 04/03/2015 2122 Full Code [id number]  Etta Quill, DO Inpatient   03/27/2015 2208 03/28/2015 1737 Full Code [id number]  Nicholes Mango, MD ED   01/09/2014 1418 01/10/2014 1803 Full Code [id number]  Deboraha Sprang, MD Inpatient   01/16/2013 2335 01/27/2013 2011 Full Code [id number]  Collene Gobble, MD Inpatient     Disposition Plan:  TBD  Consultants:  Cardiology  Time spent: 35 minutes, case discussed with daughter carmen on phone, critical care specialist and nephrology  [facility]

## 2017-10-11 NOTE — Progress Notes (Signed)
[facility] Progess Note    SYNOPSIS   Patient transferred to the intensive care unit for worsening shortness of breath.  History of acute on chronic systolic and diastolic heart failure, renal failure briefly requiring hemodialysis, atrial fibrillation, DVT with IVC filter placed.  ASSESSMENT/PLAN   Respiratory distress requiring transfer to the intensive care unit and BiPAP.  Patient has been weaned to nasal cannula.  Dyspnea improved, no distress noted and oxygen saturations 98% on nasal cannula.   Atrial fibrillation.  Patient has had cardioversion with recurrence of atrial fibrillation.  Patient appears to intermittently be in sinus bradycardia and it goes below 60 ventricular pacemaker.  Renal failure.  Patient has marginal cardiorenal status.  Too much diuresis results in renal failure. -285 yesterday Her BUN this morning has increased to 82 and creatinine 2.57. We'll hold on any diuresis today and try to match inputs and outputs.  Patient may be able to be transferred to the floor later on this afternoon she stays stable  VENTILATOR SETTINGS: FiO2 (%):  [36 %] 36 %  INTAKE / OUTPUT:  Intake/Output Summary (Last 24 hours) at 10/11/2017 0811 Last data filed at 10/11/2017 0500 Gross per 24 hour  Intake 95 ml  Output 380 ml  Net -285 ml    Name: Debra Saunders MRN: [id number] DOB: [birth year]    ADMISSION DATE:  09/30/2017   SUBJECTIVE:   Patient presently on nasal cannula.  Still complaining of significant shortness of breath.  Does have a history of obstructive sleep apnea and wore BiPAP last night.  VITAL SIGNS: Temp:  [97.9 F (36.6 C)-98.2 F (36.8 C)] 97.9 F (36.6 C) (04/21 0756) Pulse Rate:  [59-131] 59 (04/21 0600) Resp:  [14-24] 16 (04/21 0600) BP: (87-122)/(60-93) 87/70 (04/21 0600) SpO2:  [92 %-100 %] 98 % (04/21 0600) FiO2 (%):  [36 %] 36 % (04/20 1921) Weight:  [242 lb 1 oz (109.8 kg)] 242 lb 1 oz (109.8 kg) (04/21  0410)   PHYSICAL EXAMINATION: Physical Examination:   VS: BP (!) 87/70   Pulse (!) 59   Temp 97.9 F (36.6 C) (Oral)   Resp 16   Ht 5\' 1"  (1.549 m)   Wt 242 lb 1 oz (109.8 kg)   SpO2 98%   BMI 45.74 kg/m   General Appearance: No distress  Neuro:without focal findings, mental status normal. HEENT: PERRLA, EOM intact. Pulmonary: normal breath sounds   Cardiovascular irregularly irregular rhythm, controlled ventricular response.  Atrial fibrillation noted on monitor Abdomen: Benign, Soft, non-tender. Extremities: normal, no cyanosis, clubbing.    LABORATORY PANEL:   CBC Recent Labs  Lab 10/11/17 0423  WBC 10.1  HGB 9.8*  HCT 30.5*  PLT 206    Chemistries  Recent Labs  Lab 10/10/17 0359 10/11/17 0423  NA 134* 134*  K 4.5 4.0  CL 99* 99*  CO2 29 27  GLUCOSE 216* 206*  BUN 68* 82*  CREATININE 2.10* 2.57*  CALCIUM 9.3 9.1  MG 1.9  --   PHOS 5.1*  --     Recent Labs  Lab 10/09/17 2113 10/10/17 0740 10/10/17 1131 10/10/17 1618 10/10/17 2101 10/11/17 0740  GLUCAP 253* 186* 272* 189* 284* 175*   No results for input(s): PHART, PCO2ART, PO2ART in the last 168 hours. No results for input(s): AST, ALT, ALKPHOS, BILITOT, ALBUMIN in the last 168 hours.  Cardiac Enzymes Recent Labs  Lab 10/09/17 1420  TROPONINI 2.35*    RADIOLOGY:  No results found. Hermelinda Dellen,  DO  4/21/2019Patient ID: [name], female   DOB: 1946-03-14, 72 y.o.   MRN: [id number] Patient ID: PARMINDER TRAPANI, female   DOB: 14-Mar-1946, 72 y.o.   MRN: [id number] Patient ID: TENNELLE TAFLINGER, female   DOB: Jun 05, 1946, 72 y.o.   MRN: [id number]

## 2017-10-12 ENCOUNTER — Inpatient Hospital Stay: Payer: Medicare HMO | Admitting: Certified Registered"

## 2017-10-12 ENCOUNTER — Inpatient Hospital Stay: Payer: Medicare HMO

## 2017-10-12 ENCOUNTER — Encounter: Payer: Self-pay | Admitting: Anesthesiology

## 2017-10-12 ENCOUNTER — Encounter
Admission: EM | Disposition: A | Discharge status: Skilled Nursing Facility | Payer: Self-pay | Source: Home / Self Care | Attending: Internal Medicine

## 2017-10-12 DIAGNOSIS — J9621 Acute and chronic respiratory failure with hypoxia: Secondary | ICD-10-CM

## 2017-10-12 DIAGNOSIS — N170 Acute kidney failure with tubular necrosis: Secondary | ICD-10-CM

## 2017-10-12 HISTORY — PX: CARDIOVERSION: EP1203

## 2017-10-12 LAB — GLUCOSE, CAPILLARY
Glucose-Capillary: 235 mg/dL — ABNORMAL HIGH (ref 65–99)
Glucose-Capillary: 282 mg/dL — ABNORMAL HIGH (ref 65–99)
Glucose-Capillary: 340 mg/dL — ABNORMAL HIGH (ref 65–99)
Glucose-Capillary: 271 mg/dL — ABNORMAL HIGH (ref 65–99)

## 2017-10-12 LAB — HEPARIN LEVEL (UNFRACTIONATED)
Heparin Unfractionated: 2.74 IU/mL — ABNORMAL HIGH (ref 0.30–0.70)
Heparin Unfractionated: 0.58 IU/mL (ref 0.30–0.70)

## 2017-10-12 LAB — CBC
HCT: 31.2 % — ABNORMAL LOW (ref 35.0–47.0)
Hemoglobin: 9.9 g/dL — ABNORMAL LOW (ref 12.0–16.0)
MCH: 27.1 pg (ref 26.0–34.0)
MCHC: 31.6 g/dL — ABNORMAL LOW (ref 32.0–36.0)
MCV: 85.5 fL (ref 80.0–100.0)
Platelets: 207 10*3/uL (ref 150–440)
RBC: 3.65 MIL/uL — ABNORMAL LOW (ref 3.80–5.20)
RDW: 16.8 % — ABNORMAL HIGH (ref 11.5–14.5)
WBC: 8.9 10*3/uL (ref 3.6–11.0)

## 2017-10-12 SURGERY — CARDIOVERSION (CATH LAB)
Anesthesia: General

## 2017-10-12 MED ORDER — PROPOFOL 10 MG/ML IV BOLUS
INTRAVENOUS | Status: DC | PRN
  Administered 2017-10-12: 20 mg via INTRAVENOUS
  Administered 2017-10-12: 30 mg via INTRAVENOUS
  Administered 2017-10-12: 50 mg via INTRAVENOUS

## 2017-10-12 MED ORDER — PROPOFOL 10 MG/ML IV BOLUS
INTRAVENOUS | Status: AC
  Filled 2017-10-12: qty 20

## 2017-10-12 NOTE — Progress Notes (Signed)
Progress Note  Patient Name: Debra Saunders Date of Encounter: 10/12/2017  Primary Cardiologist: [name], MD  Subjective   acute respiratory distress 3 days ago, transferred to the ICU.  Chest x-ray pulmonary edema  Given IV furosemide  Past two days with improbved respiratory status, worsening renal function today, CR 2.5 Continued cough, getting better Very weak from being in bed On 4 liters [place] oxygen  Inpatient Medications    Scheduled Meds: . amiodarone  200 mg Oral BID  . benzonatate  100 mg Oral TID  . budesonide (PULMICORT) nebulizer solution  0.5 mg Nebulization BID  . chlorhexidine  15 mL Mouth Rinse BID  . clopidogrel  75 mg Oral Daily  . doxycycline  100 mg Oral Q12H  . feeding supplement  1 Container Oral BID BM  . gabapentin  300 mg Oral QHS  . insulin aspart  0-15 Units Subcutaneous TID WC  . insulin aspart  0-5 Units Subcutaneous QHS  . ipratropium-albuterol  3 mL Nebulization TID  . mouth rinse  15 mL Mouth Rinse q12n4p  . methylPREDNISolone (SOLU-MEDROL) injection  40 mg Intravenous Q12H  . multivitamin with minerals  1 tablet Oral Daily  . mupirocin ointment   Nasal BID  . rosuvastatin  10 mg Oral Daily  . sildenafil  20 mg Oral TID  . sodium chloride flush  3 mL Intravenous Q12H   Continuous Infusions: . sodium chloride    . heparin 950 Units/hr (10/11/17 1851)   PRN Meds: sodium chloride, acetaminophen, ALPRAZolam, ipratropium-albuterol, menthol-cetylpyridinium, nitroGLYCERIN, ondansetron (ZOFRAN) IV, ondansetron, promethazine, sodium chloride flush   Vital Signs    Vitals:   10/11/17 1502 10/11/17 1924 10/12/17 0413 10/12/17 0714  BP: (!) 116/57 117/65 126/82 (!) 143/87  Pulse: 71 75 64 69  Resp:  18 18 18   Temp: 98.2 F (36.8 C) 98.3 F (36.8 C) 98.1 F (36.7 C) 98.3 F (36.8 C)  TempSrc: Oral Oral    SpO2: 100% 100% 99% 98%  Weight:   246 lb (111.6 kg)   Height:        Intake/Output Summary (Last 24 hours) at 10/12/2017  0742 Last data filed at 10/12/2017 0534 Gross per 24 hour  Intake 356.88 ml  Output 776 ml  Net -419.12 ml   Filed Weights   10/11/17 0410 10/11/17 1410 10/12/17 0413  Weight: 242 lb 1 oz (109.8 kg) 244 lb 4.3 oz (110.8 kg) 246 lb (111.6 kg)    Physical Exam   Constitutional:  oriented to person, place, and time. No distress. obese.  HENT:  Head: Normocephalic and atraumatic.  Eyes:  no discharge. No scleral icterus.  Neck: Normal range of motion. Neck supple. No JVD present.  Cardiovascular: Normal rate, regular rhythm, normal heart sounds and intact distal pulses. Exam reveals no gallop and no friction rub. No edema No murmur heard. Pulmonary/Chest: moderately decreased BS throughout, rales at the bases,  No stridor. No respiratory distress.  no wheezes.  no tenderness.  Abdominal: Soft.  no distension.  no tenderness.  Musculoskeletal: Normal range of motion.  no  tenderness or deformity.  Neurological:  normal muscle tone. Coordination normal. No atrophy Skin: Skin is warm and dry. No rash noted. not diaphoretic.  Psychiatric:  normal mood and affect. behavior is normal. Thought content normal.      Labs    Chemistry Recent Labs  Lab 10/09/17 0207 10/10/17 0359 10/11/17 0423  NA 138 134* 134*  K 4.4 4.5 4.0  CL 101  99* 99*  CO2 28 29 27   GLUCOSE 216* 216* 206*  BUN 46* 68* 82*  CREATININE 1.82* 2.10* 2.57*  CALCIUM 9.3 9.3 9.1  GFRNONAA 26* 22* 17*  GFRAA 30* 26* 20*  ANIONGAP 9 6 8      Hematology Recent Labs  Lab 10/10/17 0359 10/11/17 0423 10/12/17 0417  WBC 8.4 10.1 8.9  RBC 3.55* 3.59* 3.65*  HGB 9.8* 9.8* 9.9*  HCT 30.2* 30.5* 31.2*  MCV 84.9 85.0 85.5  MCH 27.5 27.3 27.1  MCHC 32.4 32.2 31.6*  RDW 17.0* 16.7* 16.8*  PLT 213 206 207     Radiology    No results found.  Telemetry    Afib w/ V pacing on demand - Personally Reviewed EKG post cardioversion with NSR, pacing  Cardiac Studies   ECHO: 10/01/17 - Left ventricle: The  cavity size was normal. There was mild concentric hypertrophy. Systolic function was normal. The estimated ejection fraction was in the range of 50% to 55%. Wall motion was normal; there were no regional wall motion abnormalities. The study is not technically sufficient to allow evaluation of LV diastolic function. - Mitral valve: There was moderate to severe regurgitation. - Left atrium: The atrium was moderately dilated. - Right ventricle: The cavity size was mildly dilated. Wall thickness was normal. Systolic function was mildly reduced. - Right atrium: The atrium was moderately dilated. - Tricuspid valve: There was moderate regurgitation. - Pulmonary arteries: Systolic pressure was severely elevated PA peak pressure: 69 mm Hg (S).  Patient Profile     72 y.o. 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   Acute renal failure due to ATN:    Hold lasix given worsening renal; function Possible component of ATN  2.  Acute on chronic combined systolic and diastolic CHF:  Stable, now back in NSR which should assist with recovery Lasix on hold  3.  Persistent AF: Cardioversion this AM, NSR restored, Continue amiodarone oral dosing BID Not a good candidate with renal dysfunction for eliquis or xarelto at this  time consideer starting warfarin, continue heparin bridge  4.  Mod to Sev MR:   repeeat echo now that she is in NSR following cardioversion  5.  PAH:    Cont sildenafil.     Total encounter time more than 25 minutes  Greater than 50% was spent in counseling and coordination of care with the patient   Signed, Ida Rogue, MD  10/12/2017, 7:42 AM    For questions or updates, please contact   Please consult [email or web address] for contact info under Cardiology/STEMI.

## 2017-10-12 NOTE — Progress Notes (Signed)
[facility]  ROUNDING NOTE   Subjective:   Cardioversion this morning. No labs today.   Objective:  Vital signs in last 24 hours:  Temp:  [96.8 F (36 C)-98.3 F (36.8 C)] 97.7 F (36.5 C) (04/22 0825) Pulse Rate:  [56-75] 57 (04/22 0825) Resp:  [16-27] 16 (04/22 0825) BP: (102-153)/(57-87) 121/68 (04/22 0825) SpO2:  [66 %-100 %] 100 % (04/22 1332) Weight:  [111.6 kg (246 lb)] 111.6 kg (246 lb) (04/22 0413)  Weight change: 1 kg (2 lb 3.3 oz) Filed Weights   10/11/17 0410 10/11/17 1410 10/12/17 0413  Weight: 109.8 kg (242 lb 1 oz) 110.8 kg (244 lb 4.3 oz) 111.6 kg (246 lb)    Intake/Output: I/O last 3 completed shifts: In: 356.9 [I.V.:356.9] Out: 976 [Urine:975; Stool:1]   Intake/Output this shift:  Total I/O In: 100 [I.V.:100] Out: 0   Physical Exam: General:  No acute distress  Head: Normocephalic, atraumatic.   Eyes: Anicteric  Neck: Supple, trachea midline  Lungs:  Scattered rhonchi, normal effort  Heart: S1S2 no rubs  Abdomen:  Soft, nontender, bowel sounds present  Extremities: trace peripheral edema.  Neurologic: Awake, alert, following commands  Skin: No lesions       Basic Metabolic Panel: Recent Labs  Lab 10/06/17 0020 10/06/17 1012 10/07/17 0608 10/09/17 0207 10/09/17 0411 10/10/17 0359 10/11/17 0423  NA 125*  --  135 138  --  134* 134*  K 5.6*  --  4.5 4.4  --  4.5 4.0  CL 88*  --  98* 101  --  99* 99*  CO2 26  --  28 28  --  29 27  GLUCOSE 381*  --  128* 216*  --  216* 206*  BUN 83*  --  76* 46*  --  68* 82*  CREATININE 4.65*  --  3.76* 1.82*  --  2.10* 2.57*  CALCIUM 7.8*  --  8.3* 9.3  --  9.3 9.1  MG  --   --   --   --  1.8 1.9  --   PHOS  --  5.5* 5.3*  --  5.2* 5.1*  --     Liver Function Tests: No results for input(s): AST, ALT, ALKPHOS, BILITOT, PROT, ALBUMIN in the last 168 hours. No results for input(s): LIPASE, AMYLASE in the last 168 hours. No results for input(s): AMMONIA in the last 168  hours.  CBC: Recent Labs  Lab 10/08/17 0355 10/09/17 0207 10/10/17 0359 10/11/17 0423 10/12/17 0417  WBC 7.6 7.9 8.4 10.1 8.9  HGB 9.2* 10.0* 9.8* 9.8* 9.9*  HCT 28.9* 31.1* 30.2* 30.5* 31.2*  MCV 85.5 84.6 84.9 85.0 85.5  PLT 199 220 213 206 207    Cardiac Enzymes: Recent Labs  Lab 10/09/17 0834 10/09/17 1420  TROPONINI 2.70* 2.35*    BNP: Invalid input(s): POCBNP  CBG: Recent Labs  Lab 10/11/17 1155 10/11/17 1742 10/11/17 2109 10/12/17 0825 10/12/17 1135  GLUCAP 354* 325* 237* 235* 62*    Microbiology: Results for orders placed or performed during the hospital encounter of 09/30/17  MRSA PCR Screening     Status: Abnormal   Collection Time: 10/01/17  9:37 AM  Result Value Ref Range Status   MRSA by PCR POSITIVE (A) NEGATIVE Final    Comment:        [name] (FDA approved for NASAL specimens only), is one component of a comprehensive MRSA colonization surveillance program. It is not intended to diagnose MRSA infection nor  to guide or monitor treatment for MRSA infections. RESULT CALLED TO, READ BACK BY AND VERIFIED WITH:  Gildardo Pounds AT 1207 10/01/17 SDR Performed at [facility], [address] [place], [place] [address]     Coagulation Studies: No results for input(s): LABPROT, INR in the last 72 hours.  Urinalysis: No results for input(s): COLORURINE, LABSPEC, PHURINE, GLUCOSEU, HGBUR, BILIRUBINUR, KETONESUR, PROTEINUR, UROBILINOGEN, NITRITE, LEUKOCYTESUR in the last 72 hours.  Invalid input(s): APPERANCEUR    Imaging: Dg Chest Port 1 View  Result Date: 10/12/2017 CLINICAL DATA:  CHF, pulmonary edema, status post cardioversion. EXAM: PORTABLE CHEST 1 VIEW COMPARISON:  Portable chest x-ray of October 08, 2017 FINDINGS: The right hemidiaphragm remains elevated. The interstitial markings of both lungs remain increased with areas of patchy a airspace opacities slightly less conspicuous today. The cardiac silhouette  remains enlarged. The pulmonary vascularity remains engorged but is less so. The ICD is in stable position. There is calcification in the wall of the aortic arch. The bony thorax exhibits no acute abnormality. IMPRESSION: CHF with pulmonary interstitial and alveolar edema slightly improved over the earlier study. Chronic elevation of the right hemidiaphragm. Thoracic aortic atherosclerosis. Electronically Signed   By: David  Martinique M.D.   On: 10/12/2017 09:27     Medications:   . sodium chloride Stopped (10/12/17 0830)  . heparin 950 Units/hr (10/11/17 1851)   . amiodarone  200 mg Oral BID  . benzonatate  100 mg Oral TID  . budesonide (PULMICORT) nebulizer solution  0.5 mg Nebulization BID  . chlorhexidine  15 mL Mouth Rinse BID  . clopidogrel  75 mg Oral Daily  . doxycycline  100 mg Oral Q12H  . gabapentin  300 mg Oral QHS  . insulin aspart  0-15 Units Subcutaneous TID WC  . insulin aspart  0-5 Units Subcutaneous QHS  . ipratropium-albuterol  3 mL Nebulization TID  . mouth rinse  15 mL Mouth Rinse q12n4p  . methylPREDNISolone (SOLU-MEDROL) injection  40 mg Intravenous Q12H  . multivitamin with minerals  1 tablet Oral Daily  . mupirocin ointment   Nasal BID  . rosuvastatin  10 mg Oral Daily  . sildenafil  20 mg Oral TID  . sodium chloride flush  3 mL Intravenous Q12H   sodium chloride, acetaminophen, ALPRAZolam, ipratropium-albuterol, menthol-cetylpyridinium, ondansetron (ZOFRAN) IV, ondansetron, promethazine, sodium chloride flush  Assessment/ Plan:  72 y.o.black female with a PMHx of chronic diastolic heart failure, atrial fibrillation, chronic hypercarbic respiratory failure, COPD, morbid obesity, hyperlipidemia, diabetes mellitus type 2, history of intracranial hemorrhage who was admitted to [facility] on 09/30/2017   1.  Acute renal failure: Acute renal failure secondary to overdiuresis and hypotension.  Required two treatment of hemodialysis in the past. No acute indication for  dialysis.  - Continue to monitor urine output. Discontinue foley catheter.   2. Hypertension with atrial fibrillation: status post cardioversion on 4/22.  Acute exacerbation of diastolic congestive heart failure.  - amiodarone. - sildenafil for pulmonary hypertension.  - holding furosemide.   2.  Anemia with renal failure: hemoglobin 9.9.  3.  Hyponatremia: seems to be chronic. Continue to monitor.     LOS: 12 Elwin Tsou 4/22/20192:59 PM

## 2017-10-12 NOTE — H&P (Signed)
H&P Addendum, pre-cardioversion  Patient was seen and evaluated prior to -cardioversion procedure Symptoms, prior testing details again confirmed with the patient Patient examined, no significant change from prior exam Lab work reviewed in detail personally by myself Patient understands risk and benefit of the procedure, willing to proceed  Signed, Tim Gollan, MD, Ph.D [facility]  

## 2017-10-12 NOTE — Progress Notes (Addendum)
Patient ID: Debra Saunders, female   DOB: [birth year], 72 y.o.   MRN: [id number]   [facility] PROGRESS NOTE  ALIRA FRETWELL MLY:[id number] DOB: 1946-02-09 DOA: 09/30/2017 PCP: [name], MD  HPI/Subjective: Patient doing better states breathing improved   Vitals:   10/12/17 0813 10/12/17 0825  BP: 117/73 121/68  Pulse: 65 (!) 57  Resp: 18 16  Temp:  97.7 F (36.5 C)  SpO2: 97% 100%    Filed Weights   10/11/17 0410 10/11/17 1410 10/12/17 0413  Weight: 109.8 kg (242 lb 1 oz) 110.8 kg (244 lb 4.3 oz) 111.6 kg (246 lb)     CONSTITUTIONAL: No documented fever. No fatigue, weakness. No weight gain, no weight loss.  EYES: No blurry or double vision.  ENT: No tinnitus. No postnasal drip. No redness of the oropharynx.  RESPIRATORY: No cough, no wheeze, no hemoptysis.  Positive dyspnea.  CARDIOVASCULAR: No chest pain. No orthopnea. No palpitations. No syncope.  GASTROINTESTINAL: No nausea, no vomiting or diarrhea. No abdominal pain. No melena or hematochezia.  GENITOURINARY:  No urgency. No frequency. No dysuria. No hematuria. No obstructive symptoms. No discharge. No pain. No significant abnormal bleeding ENDOCRINE: No polyuria or nocturia. No heat or cold intolerance.  HEMATOLOGY: No anemia. No bruising. No bleeding. No purpura. No petechiae INTEGUMENTARY: No rashes. No lesions.  MUSCULOSKELETAL: No arthritis. No swelling. No gout.  NEUROLOGIC: No numbness, tingling, or ataxia. No seizure-type activity.  PSYCHIATRIC: No anxiety. No insomnia. No ADD.     Exam: Physical Exam  Constitutional: She is oriented to person, place, and time.  HENT:  Nose: No mucosal edema.  Mouth/Throat: No oropharyngeal exudate.  Eyes: Pupils are equal, round, and reactive to light. Conjunctivae, EOM and lids are normal.  Neck: No JVD present. Carotid bruit is not present. No edema present. No thyroid mass and no thyromegaly present.  Cardiovascular: Regular rhythm, S1 normal and S2  normal. Exam reveals no gallop.  No murmur heard. Pulses:      Dorsalis pedis pulses are 2+ on the right side, and 2+ on the left side.  Respiratory: Accessory muscle usage present. She is in respiratory distress. She has decreased breath sounds in the right middle field, the right lower field, the left middle field and the left lower field. She has no wheezes. She has rhonchi in the right middle field and the left middle field. She has rales in the right lower field and the left lower field.  GI: Soft. Bowel sounds are normal. There is no tenderness.  Musculoskeletal:       Right ankle: She exhibits swelling.       Left ankle: She exhibits swelling.  Lymphadenopathy:    She has no cervical adenopathy.  Neurological: She is alert and oriented to person, place, and time. No cranial nerve deficit.  Skin: Skin is warm. No rash noted. Nails show no clubbing.  Psychiatric: She has a normal mood and affect.      Data Reviewed: Basic Metabolic Panel: Recent Labs  Lab 10/06/17 0020 10/06/17 1012 10/07/17 8127 10/09/17 0207 10/09/17 0411 10/10/17 0359 10/11/17 0423  NA 125*  --  135 138  --  134* 134*  K 5.6*  --  4.5 4.4  --  4.5 4.0  CL 88*  --  98* 101  --  99* 99*  CO2 26  --  28 28  --  29 27  GLUCOSE 381*  --  128* 216*  --  216* 206*  BUN 83*  --  76* 46*  --  68* 82*  CREATININE 4.65*  --  3.76* 1.82*  --  2.10* 2.57*  CALCIUM 7.8*  --  8.3* 9.3  --  9.3 9.1  MG  --   --   --   --  1.8 1.9  --   PHOS  --  5.5* 5.3*  --  5.2* 5.1*  --    Acute kidney injury CBC: Recent Labs  Lab 10/08/17 0355 10/09/17 0207 10/10/17 0359 10/11/17 0423 10/12/17 0417  WBC 7.6 7.9 8.4 10.1 8.9  HGB 9.2* 10.0* 9.8* 9.8* 9.9*  HCT 28.9* 31.1* 30.2* 30.5* 31.2*  MCV 85.5 84.6 84.9 85.0 85.5  PLT 199 220 213 206 207   BNP (last 3 results) Recent Labs    08/04/17 1552 08/16/17 0925 09/30/17 1545  BNP 175.0* 655.0* 1,151.0*     CBG: Recent Labs  Lab 10/11/17 1155 10/11/17 1742  10/11/17 2109 10/12/17 0825 10/12/17 1135  GLUCAP 354* 325* 237* 235* 282*    No results found for this or any previous visit (from the past 240 hour(s)).    Scheduled Meds: . amiodarone  200 mg Oral BID  . benzonatate  100 mg Oral TID  . budesonide (PULMICORT) nebulizer solution  0.5 mg Nebulization BID  . chlorhexidine  15 mL Mouth Rinse BID  . clopidogrel  75 mg Oral Daily  . doxycycline  100 mg Oral Q12H  . gabapentin  300 mg Oral QHS  . insulin aspart  0-15 Units Subcutaneous TID WC  . insulin aspart  0-5 Units Subcutaneous QHS  . ipratropium-albuterol  3 mL Nebulization TID  . mouth rinse  15 mL Mouth Rinse q12n4p  . methylPREDNISolone (SOLU-MEDROL) injection  40 mg Intravenous Q12H  . multivitamin with minerals  1 tablet Oral Daily  . mupirocin ointment   Nasal BID  . rosuvastatin  10 mg Oral Daily  . sildenafil  20 mg Oral TID  . sodium chloride flush  3 mL Intravenous Q12H   Continuous Infusions: . sodium chloride Stopped (10/12/17 0830)  . heparin 950 Units/hr (10/11/17 1851)    Assessment/Plan:  1. Acute respiratory failure in a setting of acute on chronic diastolic CHF with flash pulmonary edema , patient's breathing is improved 2. Acute kidney injury on CKD stage 3.    May need to be hemodialyzed again nephrology following 3. Acute non-ST MI continue IV heparin  4. acute on chronic diastolic congestive heart failure.  Patient has moderate to severe mitral valve regurgitation.   Holding all medications secondary to hypotension and acute kidney injury.   5. Chronic atrial fibrillation continue on heparin drip and amiodarone.  That is post cardioversion now in normal sinus rhythm  6. chronic hypoxic hypercarbic respiratory failure.  Chronically on 4 L of oxygen. 7. Morbid obesity.  Weight loss needed 8. COPD exacerbation, continue nebulizer treatments.    9. Hyperlipidemia unspecified on Crestor 10. Type 2 diabetes mellitus on sliding scale insulin 11. Upper  respiratory tract infection on oral doxycycline 12. Hyperlipidemia unspecified on Crestor 13. No further epistaxsis 14. No further rectal bleeding    Code Status:     Code Status Orders  (From admission, onward)        Start     Ordered   09/30/17 1901  Full code  Continuous     09/30/17 1901    Code Status History    Date Active Date Inactive Code Status Order ID Comments User  Context   08/16/2017 1614 08/28/2017 2239 Full Code [id number]  Gorden Harms, MD Inpatient   03/30/2015 2152 04/03/2015 2122 Full Code [id number]  Etta Quill, DO Inpatient   03/27/2015 2208 03/28/2015 1737 Full Code [id number]  Nicholes Mango, MD ED   01/09/2014 1418 01/10/2014 1803 Full Code [id number]  Deboraha Sprang, MD Inpatient   01/16/2013 2335 01/27/2013 2011 Full Code [id number]  Collene Gobble, MD Inpatient     Disposition Plan:  TBD  Consultants:  Cardiology  Time spent: 35 minutes, case discussed with daughter carmen on phone, critical care specialist and nephrology  [facility]

## 2017-10-12 NOTE — Transfer of Care (Signed)
Immediate Anesthesia Transfer of Care Note  Patient: Debra Saunders  Procedure(s) Performed: CARDIOVERSION (N/A )  Patient Location: PACU and Cath Lab  Anesthesia Type:General  Level of Consciousness: drowsy and patient cooperative  Airway & Oxygen Therapy: Patient Spontanous Breathing and Patient connected to nasal cannula oxygen  Post-op Assessment: Report given to RN, Post -op Vital signs reviewed and stable and Patient moving all extremities  Post vital signs: Reviewed  Last Vitals:  Vitals Value Taken Time  BP 116/68 10/12/2017  7:46 AM  Temp 36 C 10/12/2017  7:46 AM  Pulse 68 10/12/2017  7:46 AM  Resp 17 10/12/2017  7:46 AM  SpO2 97 % 10/12/2017  7:46 AM    Last Pain:  Vitals:   10/12/17 0746  TempSrc: Temporal  PainSc: 0-No pain      Patients Stated Pain Goal: 0 (43/83/81 8403)  Complications: No apparent anesthesia complications

## 2017-10-12 NOTE — Anesthesia Post-op Follow-up Note (Signed)
Anesthesia QCDR form completed.        

## 2017-10-12 NOTE — Anesthesia Postprocedure Evaluation (Signed)
Anesthesia Post Note  Patient: Debra Saunders  Procedure(s) Performed: CARDIOVERSION (N/A )  Patient location during evaluation: Cath Lab Anesthesia Type: General Level of consciousness: awake and alert, oriented and patient cooperative Pain management: satisfactory to patient Vital Signs Assessment: post-procedure vital signs reviewed and stable Respiratory status: spontaneous breathing and respiratory function stable Cardiovascular status: blood pressure returned to baseline and stable Postop Assessment: no headache, no backache, patient able to bend at knees, no apparent nausea or vomiting and adequate PO intake Anesthetic complications: no     Last Vitals:  Vitals:   10/12/17 0745 10/12/17 0746  BP:  116/68  Pulse: 71 68  Resp: 18 17  Temp:  (!) 36 C  SpO2: 95% 97%    Last Pain:  Vitals:   10/12/17 0746  TempSrc: Temporal  PainSc: 0-No pain                 Ajiah Mcglinn H Lilleigh Hechavarria

## 2017-10-12 NOTE — Anesthesia Preprocedure Evaluation (Signed)
Anesthesia Evaluation  Patient identified by MRN, date of birth, ID band Patient awake    Reviewed: Allergy & Precautions, NPO status , Patient's Chart, lab work & pertinent test results  History of Anesthesia Complications Negative for: history of anesthetic complications  Airway Mallampati: III  TM Distance: <3 FB     Dental  (+) Dental Advidsory Given   Pulmonary shortness of breath, with exertion and Long-Term Oxygen Therapy, sleep apnea , COPD,  COPD inhaler and oxygen dependent, neg recent URI, former smoker,    + rhonchi  + decreased breath sounds      Cardiovascular hypertension, (-) angina+CHF  + dysrhythmias Atrial Fibrillation      Neuro/Psych CVA negative psych ROS   GI/Hepatic Neg liver ROS,   Endo/Other  diabetes, Well Controlled, Type 2, Oral Hypoglycemic AgentsHypothyroidism   Renal/GU CRF and ESRFRenal disease Bladder dysfunction      Musculoskeletal  (+) Arthritis , Osteoarthritis,    Abdominal (+) + obese,   Peds negative pediatric ROS (+)  Hematology  (+) anemia ,   Anesthesia Other Findings Past Medical History: No date: Arthritis No date: Atrial fibrillation, persistent ([place]) No date: Chest pain     Comment:  a. 01/2012 Cath: nonobs dzs;  c. 04/2014 MV: No ischemia. No date: Chronic back pain No date: Chronic diastolic CHF (congestive heart failure) ([place])     Comment:  a. Dx 2005 @ [facility];  b. 02/2014 Echo: Ef 55-60%, no rwma,               mild MR, PASP 22mmHg. No date: Chronic respiratory failure ([place])     Comment:  a. on home O2 No date: COPD (chronic obstructive pulmonary disease) ([place])     Comment:  a. Home O2 - 3lpm No date: Gastritis No date: History of intracranial hemorrhage     Comment:  6/14 described by neurosurgery as "small frontal               posttraumatic SAH " No date: Hyperlipidemia No date: Hypertension No date: Lipoma of back No date: Lymphedema     Comment:  a.  Chronic LLE edema. No date: Obesity No date: Sepsis with metabolic encephalopathy (HCC) No date: Sleep apnea No date: Streptococcal bacteremia No date: Stroke [facility]) No date: Symptomatic bradycardia     Comment:  a. s/p BSX PPM. No date: Thyroid disorder No date: Thyroid nodule No date: Type II diabetes mellitus (HCC) No date: Urinary incontinence  Reproductive/Obstetrics negative OB ROS                             Anesthesia Physical  Anesthesia Plan  ASA: IV  Anesthesia Plan: General   Post-op Pain Management:    Induction: Intravenous  PONV Risk Score and Plan: 3 and Propofol infusion  Airway Management Planned: Simple Face Mask and Nasal Cannula  Additional Equipment:   Intra-op Plan:   Post-operative Plan:   Informed Consent: I have reviewed the patients History and Physical, chart, labs and discussed the procedure including the risks, benefits and alternatives for the proposed anesthesia with the patient or authorized representative who has indicated his/her understanding and acceptance.   Dental advisory given  Plan Discussed with: CRNA and Surgeon  Anesthesia Plan Comments:         Anesthesia Quick Evaluation

## 2017-10-12 NOTE — CV Procedure (Signed)
Cardioversion procedure note For atrial fibrillation, persistent  Procedure Details:  Consent: Risks of procedure as well as the alternatives and risks of each were explained to the (patient/caregiver).  Consent for procedure obtained.  Time Out: Verified patient identification, verified procedure, site/side was marked, verified correct patient position, special equipment/implants available, medications/allergies/relevent history reviewed, required imaging and test results available.  Performed  Patient placed on cardiac monitor, pulse oximetry, supplemental oxygen as necessary.   Sedation given: propofol IV, Dr. Karenz Pacer pads placed anterior and posterior chest.   Cardioverted 1 time(s).   Cardioverted at  150 J. Synchronized biphasic Converted to NSR   Evaluation: Findings: Post procedure EKG shows: NSR Complications: None Patient did tolerate procedure well.  Time Spent Directly with the Patient:  45 minutes   Tim Lian Pounds, M.D., Ph.D.  

## 2017-10-12 NOTE — Progress Notes (Signed)
Spoke with Dr. Rockey Situ, pt will get cardioversion this morning. Consent signed. Report given to specials. Transport here to take pt down.

## 2017-10-12 NOTE — Progress Notes (Signed)
Inpatient Diabetes Program Recommendations  AACE/ADA: New Consensus Statement on Inpatient Glycemic Control (2015)  Target Ranges:  Prepandial:   less than 140 mg/dL      Peak postprandial:   less than 180 mg/dL (1-2 hours)      Critically ill patients:  140 - 180 mg/dL  Results for GLADYCE, MCRAY (MRN [id number]) as of 10/12/2017 12:48  Ref. Range 10/11/2017 07:40 10/11/2017 11:55 10/11/2017 17:42 10/11/2017 21:09 10/12/2017 08:25 10/12/2017 11:35  Glucose-Capillary Latest Ref Range: 65 - 99 mg/dL 175 (H) 354 (H) 325 (H) 237 (H) 235 (H) 282 (H)   Results for CHARNEE, TURNIPSEED (MRN [id number]) as of 10/12/2017 12:48  Ref. Range 08/16/2017 14:41 09/30/2017 19:24  Hemoglobin A1C Latest Ref Range: 4.8 - 5.6 % 5.6 5.8 (H)   Review of Glycemic Control  Diabetes history: DM2 Outpatient Diabetes medications: Metformin 7902 mg BID, [name] 4.09 mg weekly Current orders for Inpatient glycemic control: Novolog 0-15 units TID with meals, Novolog 0-5 units QHS; Solumedrol 40 mg Q12H  Inpatient Diabetes Program Recommendations:  Insulin - Basal: If steroids are continued, please consider ordering Lantus 10 units Q24H starting now (based on 110 kg x 0.1 units). Insulin - Meal Coverage: If steroids are continued and post prandial glucose is consistently greater than 180 mg/dl, please consider ordering Novolog 3 units TID with meals for meal coverage if patient eats at least 50% of meals. Diet: Please discontinue Regular diet and order Carb Modified diet.  Thanks, Barnie Alderman, RN, MSN, CDE Diabetes Coordinator Inpatient Diabetes Program [phone] (Team Pager from 8am to 5pm)

## 2017-10-12 NOTE — Progress Notes (Addendum)
[place] for heparin Indication: atrial fibrillation  Allergies  Allergen Reactions  . Aspirin Hives and Other (See Comments)    Pt states that she is unable to take because she had bleeding in her brain.    . Other Other (See Comments)    Pt states that she is unable to take blood thinners because she had bleeding in her brain.  Blood Thinners-per doctor at [facility]  . Penicillins Hives, Shortness Of Breath, Swelling and Other (See Comments)    Has patient had a PCN reaction causing immediate rash, facial/tongue/throat swelling, SOB or lightheadedness with hypotension: Yes Has patient had a PCN reaction causing severe rash involving mucus membranes or skin necrosis: No Has patient had a PCN reaction that required hospitalization No Has patient had a PCN reaction occurring within the last 10 years: No If all of the above answers are "NO", then may proceed with Cephalosporin use.    Patient Measurements: Height: 5\' 1"  (154.9 cm) Weight: 246 lb (111.6 kg) IBW/kg (Calculated) : 47.8 Heparin Dosing Weight: 74 kg  Vital Signs: Temp: 98.1 F (36.7 C) (04/22 0413) Temp Source: Oral (04/21 1924) BP: 126/82 (04/22 0413) Pulse Rate: 64 (04/22 0413)  Labs: Recent Labs    10/09/17 0834 10/09/17 1420  10/10/17 0359 10/11/17 0423 10/12/17 0417  HGB  --   --    < > 9.8* 9.8* 9.9*  HCT  --   --   --  30.2* 30.5* 31.2*  PLT  --   --   --  213 206 207  HEPARINUNFRC  --   --   --  0.50 0.51 2.74*  CREATININE  --   --   --  2.10* 2.57*  --   TROPONINI 2.70* 2.35*  --   --   --   --    < > = values in this interval not displayed.    Estimated Creatinine Clearance: 22.2 mL/min (A) (by C-G formula based on SCr of 2.57 mg/dL (H)).   Medical History: Past Medical History:  Diagnosis Date  . Arthritis   . Atrial fibrillation, persistent ([place])    a. s/p TEE-guided DCCV on 08/20/2017  . Chest pain    a. 01/2012 Cath: nonobs dzs;  c. 04/2014 MV:  No ischemia.  . Chronic back pain   . Chronic combined systolic (congestive) and diastolic (congestive) heart failure ([place])    a. Dx 2005 @ [facility];  b. 02/2014 Echo: Ef 55-60%, no rwma, mild MR, PASP 55mmHg. c. 07/2017: EF 30-35%, diffuse HK, trivial AI, severe MR, moderate TR  . Chronic respiratory failure (HCC)    a. on home O2  . COPD (chronic obstructive pulmonary disease) ([place])    a. Home O2 - 3lpm  . Gastritis   . History of intracranial hemorrhage    a. 6/14 described by neurosurgery as "small frontal posttraumatic SAH " - pt was on xarelto @ the time.  . Hyperlipidemia   . Hypertension   . Lipoma of back   . Lymphedema    a. Chronic LLE edema.  . Obesity   . Sepsis with metabolic encephalopathy ([place])   . Sleep apnea   . Streptococcal bacteremia   . Stroke ([place])   . Symptomatic bradycardia    a. s/p BSX PPM.  . Thyroid nodule   . Type II diabetes mellitus ([place])   . Urinary incontinence     Medications:  Patient takes apixaban 5mg  BID at home for  afib. She was initially started on apixaban 5mg  BID when she was admitted, but her dose was changed to apixaban 2.5mg  BID on 4/14 due to her increase in Scr (1.4 --> 3.1). Cardiology stated that decrease in apixaban dose was for instability of patient's renal function.   Assessment: Pharmacy consulted for heparin drip management for 72 yo female transitioned from apixaban to heparin secondary to decline in renal function. Patient with past medical history significant for atrial fibrillation and heart failure. Patient currently receiving heparin at 950 units/hr.  Goal of Therapy:  Heparin level 0.3-0.7 units/ml  APTT 66-102s Monitor platelets by anticoagulation protocol: Yes   Plan:  Continue heparin 950 units/hr. As aPTT and anti-Xa levels now correlate, will manage patient via anti-Xa levels.   Pharmacy will continue to monitor and adjust per consultl   4/20 AM heparin level 0.50. Continue current regimen. Recheck heparin  level and CBC with tomorrow AM labs.  4/21 AM heparin level 0.51. Continue current regimen. Recheck heparin level and CBC with tomorrow AM labs.  4/22 AM heparin level 2.74. Ordered redraw to confirm. 0.58 on redraw, which is consistent with past results. Continue current regimen. Recheck heparin level and CBC with tomorrow AM labs.  Sim Boast, PharmD, BCPS  10/12/17 6:03 AM

## 2017-10-13 ENCOUNTER — Encounter: Payer: Self-pay | Admitting: Cardiovascular Disease

## 2017-10-13 ENCOUNTER — Inpatient Hospital Stay (HOSPITAL_COMMUNITY)
Admit: 2017-10-13 | Discharge: 2017-10-13 | Disposition: A | Discharge status: Home / Self Care | Payer: Medicare HMO | Attending: Cardiovascular Disease | Admitting: Cardiovascular Disease

## 2017-10-13 DIAGNOSIS — I5043 Acute on chronic combined systolic (congestive) and diastolic (congestive) heart failure: Secondary | ICD-10-CM

## 2017-10-13 LAB — GLUCOSE, CAPILLARY
Glucose-Capillary: 231 mg/dL — ABNORMAL HIGH (ref 65–99)
Glucose-Capillary: 259 mg/dL — ABNORMAL HIGH (ref 65–99)
Glucose-Capillary: 348 mg/dL — ABNORMAL HIGH (ref 65–99)

## 2017-10-13 LAB — CBC
HCT: 30.4 % — ABNORMAL LOW (ref 35.0–47.0)
Hemoglobin: 10 g/dL — ABNORMAL LOW (ref 12.0–16.0)
MCH: 27.8 pg (ref 26.0–34.0)
MCHC: 32.8 g/dL (ref 32.0–36.0)
MCV: 84.8 fL (ref 80.0–100.0)
Platelets: 211 10*3/uL (ref 150–440)
RBC: 3.58 MIL/uL — ABNORMAL LOW (ref 3.80–5.20)
RDW: 16.7 % — ABNORMAL HIGH (ref 11.5–14.5)
WBC: 9.6 10*3/uL (ref 3.6–11.0)

## 2017-10-13 LAB — BASIC METABOLIC PANEL
Anion gap: 7 (ref 5–15)
BUN: 102 mg/dL — ABNORMAL HIGH (ref 6–20)
CO2: 27 mmol/L (ref 22–32)
Calcium: 9.4 mg/dL (ref 8.9–10.3)
Chloride: 101 mmol/L (ref 101–111)
Creatinine, Ser: 2.7 mg/dL — ABNORMAL HIGH (ref 0.44–1.00)
GFR calc Af Amer: 19 mL/min — ABNORMAL LOW (ref >60)
GFR calc non Af Amer: 16 mL/min — ABNORMAL LOW (ref >60)
Glucose, Bld: 235 mg/dL — ABNORMAL HIGH (ref 65–99)
Potassium: 4.3 mmol/L (ref 3.5–5.1)
Sodium: 135 mmol/L (ref 135–145)

## 2017-10-13 LAB — ECHOCARDIOGRAM LIMITED
Height: 61 in
Weight: 3958.4 oz

## 2017-10-13 LAB — HEPARIN LEVEL (UNFRACTIONATED)
Heparin Unfractionated: 0.71 IU/mL — ABNORMAL HIGH (ref 0.30–0.70)
Heparin Unfractionated: 0.52 IU/mL (ref 0.30–0.70)

## 2017-10-13 NOTE — Progress Notes (Signed)
[place] for heparin Indication: atrial fibrillation  Allergies  Allergen Reactions  . Aspirin Hives and Other (See Comments)    Pt states that she is unable to take because she had bleeding in her brain.    . Other Other (See Comments)    Pt states that she is unable to take blood thinners because she had bleeding in her brain.  Blood Thinners-per doctor at [facility]  . Penicillins Hives, Shortness Of Breath, Swelling and Other (See Comments)    Has patient had a PCN reaction causing immediate rash, facial/tongue/throat swelling, SOB or lightheadedness with hypotension: Yes Has patient had a PCN reaction causing severe rash involving mucus membranes or skin necrosis: No Has patient had a PCN reaction that required hospitalization No Has patient had a PCN reaction occurring within the last 10 years: No If all of the above answers are "NO", then may proceed with Cephalosporin use.    Patient Measurements: Height: 5\' 1"  (154.9 cm) Weight: 247 lb 6.4 oz (112.2 kg) IBW/kg (Calculated) : 47.8 Heparin Dosing Weight: 74 kg  Vital Signs: Temp: 98.1 F (36.7 C) (04/23 0318) Temp Source: Oral (04/22 1936) BP: 114/66 (04/23 0318) Pulse Rate: 60 (04/23 0318)  Labs: Recent Labs    10/11/17 0423 10/12/17 0417 10/12/17 0611 10/13/17 0524  HGB 9.8* 9.9*  --  10.0*  HCT 30.5* 31.2*  --  30.4*  PLT 206 207  --  211  HEPARINUNFRC 0.51 2.74* 0.58 0.71*  CREATININE 2.57*  --   --   --     Estimated Creatinine Clearance: 22.3 mL/min (A) (by C-G formula based on SCr of 2.57 mg/dL (H)).   Medical History: Past Medical History:  Diagnosis Date  . Arthritis   . Atrial fibrillation, persistent ([place])    a. s/p TEE-guided DCCV on 08/20/2017  . Chest pain    a. 01/2012 Cath: nonobs dzs;  c. 04/2014 MV: No ischemia.  . Chronic back pain   . Chronic combined systolic (congestive) and diastolic (congestive) heart failure ([place])    a. Dx 2005 @ [facility];  b.  02/2014 Echo: Ef 55-60%, no rwma, mild MR, PASP 32mmHg. c. 07/2017: EF 30-35%, diffuse HK, trivial AI, severe MR, moderate TR  . Chronic respiratory failure (HCC)    a. on home O2  . COPD (chronic obstructive pulmonary disease) ([place])    a. Home O2 - 3lpm  . Gastritis   . History of intracranial hemorrhage    a. 6/14 described by neurosurgery as "small frontal posttraumatic SAH " - pt was on xarelto @ the time.  . Hyperlipidemia   . Hypertension   . Lipoma of back   . Lymphedema    a. Chronic LLE edema.  . Obesity   . Sepsis with metabolic encephalopathy ([place])   . Sleep apnea   . Streptococcal bacteremia   . Stroke ([place])   . Symptomatic bradycardia    a. s/p BSX PPM.  . Thyroid nodule   . Type II diabetes mellitus ([place])   . Urinary incontinence     Medications:  Patient takes apixaban 5mg  BID at home for afib. She was initially started on apixaban 5mg  BID when she was admitted, but her dose was changed to apixaban 2.5mg  BID on 4/14 due to her increase in Scr (1.4 --> 3.1). Cardiology stated that decrease in apixaban dose was for instability of patient's renal function.   Assessment: Pharmacy consulted for heparin drip management for 72 yo  female transitioned from apixaban to heparin secondary to decline in renal function. Patient with past medical history significant for atrial fibrillation and heart failure. Patient currently receiving heparin at 950 units/hr.  Goal of Therapy:  Heparin level 0.3-0.7 units/ml  APTT 66-102s Monitor platelets by anticoagulation protocol: Yes   Plan:  Continue heparin 950 units/hr. As aPTT and anti-Xa levels now correlate, will manage patient via anti-Xa levels.   Pharmacy will continue to monitor and adjust per consultl   4/20 AM heparin level 0.50. Continue current regimen. Recheck heparin level and CBC with tomorrow AM labs.  4/21 AM heparin level 0.51. Continue current regimen. Recheck heparin level and CBC with tomorrow AM labs.  4/22 AM  heparin level 2.74. Ordered redraw to confirm. 0.58 on redraw, which is consistent with past results. Continue current regimen. Recheck heparin level and CBC with tomorrow AM labs.  04/23 @ 0530 HL 0.71 supratherapeutic. Will decrease rate to 850 units/hr and will recheck next HL @ 1300, CBC stable.  Tobie Lords, PharmD, BCPS Clinical Pharmacist 10/13/2017

## 2017-10-13 NOTE — Progress Notes (Signed)
Pt having nosebleed, humidity applied to O2. Tylenol given for headache. Pt states she has had nosebleeds in the past and had one last week white she was here. Will continue to monitor. Conley Simmonds, RN, BSN

## 2017-10-13 NOTE — Progress Notes (Signed)
[facility]  ROUNDING NOTE   Subjective:   Creatinine 2.7 (2.57) BUN 102 (82)  Objective:  Vital signs in last 24 hours:  Temp:  [97.9 F (36.6 C)-98.1 F (36.7 C)] 98.1 F (36.7 C) (04/23 0318) Pulse Rate:  [60-76] 61 (04/23 0838) Resp:  [16-22] 18 (04/23 0838) BP: (114-120)/(64-78) 120/69 (04/23 0838) SpO2:  [95 %-100 %] 100 % (04/23 0838) Weight:  [112.2 kg (247 lb 6.4 oz)] 112.2 kg (247 lb 6.4 oz) (04/23 0318)  Weight change: 1.42 kg (3 lb 2.1 oz) Filed Weights   10/11/17 1410 10/12/17 0413 10/13/17 0318  Weight: 110.8 kg (244 lb 4.3 oz) 111.6 kg (246 lb) 112.2 kg (247 lb 6.4 oz)    Intake/Output: I/O last 3 completed shifts: In: 745.5 [P.O.:240; I.V.:505.5] Out: 1051 [Urine:1050; Stool:1]   Intake/Output this shift:  No intake/output data recorded.  Physical Exam: General:  No acute distress  Head: Normocephalic, atraumatic.   Eyes: Anicteric  Neck: Supple, trachea midline  Lungs:  Scattered rhonchi, normal effort  Heart: S1S2 no rubs  Abdomen:  Soft, nontender, bowel sounds present  Extremities: 1+ peripheral edema.  Neurologic: Awake, alert, following commands  Skin: No lesions       Basic Metabolic Panel: Recent Labs  Lab 10/07/17 0608 10/09/17 0207 10/09/17 0411 10/10/17 0359 10/11/17 0423 10/13/17 0524  NA 135 138  --  134* 134* 135  K 4.5 4.4  --  4.5 4.0 4.3  CL 98* 101  --  99* 99* 101  CO2 28 28  --  29 27 27   GLUCOSE 128* 216*  --  216* 206* 235*  BUN 76* 46*  --  68* 82* 102*  CREATININE 3.76* 1.82*  --  2.10* 2.57* 2.70*  CALCIUM 8.3* 9.3  --  9.3 9.1 9.4  MG  --   --  1.8 1.9  --   --   PHOS 5.3*  --  5.2* 5.1*  --   --     Liver Function Tests: No results for input(s): AST, ALT, ALKPHOS, BILITOT, PROT, ALBUMIN in the last 168 hours. No results for input(s): LIPASE, AMYLASE in the last 168 hours. No results for input(s): AMMONIA in the last 168 hours.  CBC: Recent Labs  Lab 10/09/17 0207 10/10/17 0359  10/11/17 0423 10/12/17 0417 10/13/17 0524  WBC 7.9 8.4 10.1 8.9 9.6  HGB 10.0* 9.8* 9.8* 9.9* 10.0*  HCT 31.1* 30.2* 30.5* 31.2* 30.4*  MCV 84.6 84.9 85.0 85.5 84.8  PLT 220 213 206 207 211    Cardiac Enzymes: Recent Labs  Lab 10/09/17 0834 10/09/17 1420  TROPONINI 2.70* 2.35*    BNP: Invalid input(s): POCBNP  CBG: Recent Labs  Lab 10/12/17 1135 10/12/17 1625 10/12/17 2109 10/13/17 0747 10/13/17 1120  GLUCAP 282* 340* 271* 231* 68*    Microbiology: Results for orders placed or performed during the hospital encounter of 09/30/17  MRSA PCR Screening     Status: Abnormal   Collection Time: 10/01/17  9:37 AM  Result Value Ref Range Status   MRSA by PCR POSITIVE (A) NEGATIVE Final    Comment:        [name] (FDA approved for NASAL specimens only), is one component of a comprehensive MRSA colonization surveillance program. It is not intended to diagnose MRSA infection nor to guide or monitor treatment for MRSA infections. RESULT CALLED TO, READ BACK BY AND VERIFIED WITH:  Gildardo Pounds AT 1207 10/01/17 SDR Performed at [facility], [address] [place], [place] [address]     Coagulation Studies: No results for input(s): LABPROT, INR in the last 72 hours.  Urinalysis: No results for input(s): COLORURINE, LABSPEC, PHURINE, GLUCOSEU, HGBUR, BILIRUBINUR, KETONESUR, PROTEINUR, UROBILINOGEN, NITRITE, LEUKOCYTESUR in the last 72 hours.  Invalid input(s): APPERANCEUR    Imaging: Dg Chest Port 1 View  Result Date: 10/12/2017 CLINICAL DATA:  CHF, pulmonary edema, status post cardioversion. EXAM: PORTABLE CHEST 1 VIEW COMPARISON:  Portable chest x-ray of October 08, 2017 FINDINGS: The right hemidiaphragm remains elevated. The interstitial markings of both lungs remain increased with areas of patchy a airspace opacities slightly less conspicuous today. The cardiac silhouette remains enlarged. The pulmonary vascularity remains engorged  but is less so. The ICD is in stable position. There is calcification in the wall of the aortic arch. The bony thorax exhibits no acute abnormality. IMPRESSION: CHF with pulmonary interstitial and alveolar edema slightly improved over the earlier study. Chronic elevation of the right hemidiaphragm. Thoracic aortic atherosclerosis. Electronically Signed   By: David  Martinique M.D.   On: 10/12/2017 09:27     Medications:   . sodium chloride Stopped (10/12/17 0830)  . heparin 850 Units/hr (10/13/17 0321)   . amiodarone  200 mg Oral BID  . benzonatate  100 mg Oral TID  . budesonide (PULMICORT) nebulizer solution  0.5 mg Nebulization BID  . chlorhexidine  15 mL Mouth Rinse BID  . clopidogrel  75 mg Oral Daily  . doxycycline  100 mg Oral Q12H  . gabapentin  300 mg Oral QHS  . insulin aspart  0-15 Units Subcutaneous TID WC  . insulin aspart  0-5 Units Subcutaneous QHS  . ipratropium-albuterol  3 mL Nebulization TID  . mouth rinse  15 mL Mouth Rinse q12n4p  . methylPREDNISolone (SOLU-MEDROL) injection  40 mg Intravenous Q12H  . multivitamin with minerals  1 tablet Oral Daily  . mupirocin ointment   Nasal BID  . rosuvastatin  10 mg Oral Daily  . sildenafil  20 mg Oral TID  . sodium chloride flush  3 mL Intravenous Q12H   sodium chloride, acetaminophen, ALPRAZolam, ipratropium-albuterol, menthol-cetylpyridinium, ondansetron (ZOFRAN) IV, ondansetron, promethazine, sodium chloride flush  Assessment/ Plan:  72 y.o.black female with a PMHx of chronic diastolic heart failure, atrial fibrillation, chronic hypercarbic respiratory failure, COPD, morbid obesity, hyperlipidemia, diabetes mellitus type 2, history of intracranial hemorrhage who was admitted to [facility] on 09/30/2017   1.  Acute renal failure: Acute renal failure secondary to overdiuresis and hypotension.  Required two treatment of hemodialysis in the past. No acute indication for dialysis.  Creatinine continues to climb. BUN elevated, this  could partially be due to systemic steroids.  - Continue to monitor urine output.  - Holding diuretics for now. Low threshold to restart.   2. Hypertension with atrial fibrillation: status post cardioversion on 4/22.  Acute exacerbation of diastolic congestive heart failure.  - amiodarone. - sildenafil for pulmonary hypertension.  - holding furosemide.   2.  Anemia with renal failure: hemoglobin 10  3.  Hyponatremia: chronic, improved to 135 today.     LOS: 13 Debra Saunders 4/23/201911:27 AM

## 2017-10-13 NOTE — Progress Notes (Signed)
*  PRELIMINARY RESULTS* Echocardiogram 2D Echocardiogram has been performed.  Debra Saunders 10/13/2017, 3:17 PM

## 2017-10-13 NOTE — Progress Notes (Signed)
Progress Note  Patient Name: Debra Saunders Date of Encounter: 10/13/2017  Primary Cardiologist: [name], MD  Subjective   Feels well this morning, slept off and on, using her nasal CPAP Successful cardioversion yesterday Telemetry reviewed maintaining normal sinus rhythm, paced  Inpatient Medications    Scheduled Meds: . amiodarone  200 mg Oral BID  . benzonatate  100 mg Oral TID  . budesonide (PULMICORT) nebulizer solution  0.5 mg Nebulization BID  . chlorhexidine  15 mL Mouth Rinse BID  . clopidogrel  75 mg Oral Daily  . doxycycline  100 mg Oral Q12H  . gabapentin  300 mg Oral QHS  . insulin aspart  0-15 Units Subcutaneous TID WC  . insulin aspart  0-5 Units Subcutaneous QHS  . ipratropium-albuterol  3 mL Nebulization TID  . mouth rinse  15 mL Mouth Rinse q12n4p  . methylPREDNISolone (SOLU-MEDROL) injection  40 mg Intravenous Q12H  . multivitamin with minerals  1 tablet Oral Daily  . mupirocin ointment   Nasal BID  . rosuvastatin  10 mg Oral Daily  . sildenafil  20 mg Oral TID  . sodium chloride flush  3 mL Intravenous Q12H   Continuous Infusions: . sodium chloride Stopped (10/12/17 0830)  . heparin 850 Units/hr (10/13/17 0652)   PRN Meds: sodium chloride, acetaminophen, ALPRAZolam, ipratropium-albuterol, menthol-cetylpyridinium, ondansetron (ZOFRAN) IV, ondansetron, promethazine, sodium chloride flush   Vital Signs    Vitals:   10/12/17 1628 10/12/17 1936 10/12/17 2300 10/13/17 0318  BP: 117/78 120/64  114/66  Pulse: 60 67 76 60  Resp: 16 (!) 22 20 18   Temp:  97.9 F (36.6 C)  98.1 F (36.7 C)  TempSrc:  Oral    SpO2:  100% 97% 95%  Weight:    247 lb 6.4 oz (112.2 kg)  Height:        Intake/Output Summary (Last 24 hours) at 10/13/2017 0737 Last data filed at 10/13/2017 0641 Gross per 24 hour  Intake 578.61 ml  Output 450 ml  Net 128.61 ml   Filed Weights   10/11/17 1410 10/12/17 0413 10/13/17 0318  Weight: 244 lb 4.3 oz (110.8 kg) 246 lb  (111.6 kg) 247 lb 6.4 oz (112.2 kg)    Physical Exam   No significant change in exam Constitutional:  oriented to person, place, and time. No distress. obese.  HENT:  Head: Normocephalic and atraumatic.  Eyes:  no discharge. No scleral icterus.  Neck: Normal range of motion. Neck supple. No JVD present.  Cardiovascular: Normal rate, regular rhythm, normal heart sounds and intact distal pulses. Exam reveals no gallop and no friction rub. No edema No murmur heard. Pulmonary/Chest: moderately decreased BS throughout, rales at the bases,  No stridor. No respiratory distress.  no wheezes.  no tenderness.  Abdominal: Soft.  no distension.  no tenderness.  Musculoskeletal: Normal range of motion.  no  tenderness or deformity.  Neurological:  normal muscle tone. Coordination normal. No atrophy Skin: Skin is warm and dry. No rash noted. not diaphoretic.  Psychiatric:  normal mood and affect. behavior is normal. Thought content normal.      Labs    Chemistry Recent Labs  Lab 10/10/17 0359 10/11/17 0423 10/13/17 0524  NA 134* 134* 135  K 4.5 4.0 4.3  CL 99* 99* 101  CO2 29 27 27   GLUCOSE 216* 206* 235*  BUN 68* 82* 102*  CREATININE 2.10* 2.57* 2.70*  CALCIUM 9.3 9.1 9.4  GFRNONAA 22* 17* 16*  GFRAA 26* 20*  19*  ANIONGAP 6 8 7      Hematology Recent Labs  Lab 10/11/17 0423 10/12/17 0417 10/13/17 0524  WBC 10.1 8.9 9.6  RBC 3.59* 3.65* 3.58*  HGB 9.8* 9.9* 10.0*  HCT 30.5* 31.2* 30.4*  MCV 85.0 85.5 84.8  MCH 27.3 27.1 27.8  MCHC 32.2 31.6* 32.8  RDW 16.7* 16.8* 16.7*  PLT 206 207 211     Radiology    Dg Chest Port 1 View  Result Date: 10/12/2017 CLINICAL DATA:  CHF, pulmonary edema, status post cardioversion. EXAM: PORTABLE CHEST 1 VIEW COMPARISON:  Portable chest x-ray of October 08, 2017 FINDINGS: The right hemidiaphragm remains elevated. The interstitial markings of both lungs remain increased with areas of patchy a airspace opacities slightly less conspicuous  today. The cardiac silhouette remains enlarged. The pulmonary vascularity remains engorged but is less so. The ICD is in stable position. There is calcification in the wall of the aortic arch. The bony thorax exhibits no acute abnormality. IMPRESSION: CHF with pulmonary interstitial and alveolar edema slightly improved over the earlier study. Chronic elevation of the right hemidiaphragm. Thoracic aortic atherosclerosis. Electronically Signed   By: David  Martinique M.D.   On: 10/12/2017 09:27    Telemetry    Afib w/ V pacing on demand - Personally Reviewed EKG post cardioversion with NSR, pacing  Cardiac Studies   ECHO: 10/01/17 - Left ventricle: The cavity size was normal. There was mild concentric hypertrophy. Systolic function was normal. The estimated ejection fraction was in the range of 50% to 55%. Wall motion was normal; there were no regional wall motion abnormalities. The study is not technically sufficient to allow evaluation of LV diastolic function. - Mitral valve: There was moderate to severe regurgitation. - Left atrium: The atrium was moderately dilated. - Right ventricle: The cavity size was mildly dilated. Wall thickness was normal. Systolic function was mildly reduced. - Right atrium: The atrium was moderately dilated. - Tricuspid valve: There was moderate regurgitation. - Pulmonary arteries: Systolic pressure was severely elevated PA peak pressure: 69 mm Hg (S).  Patient Profile     72 y.o. female with a history of nonobstructive CAD, chronic combined systolic and diastolic congestive heart failure, persistent atrial fibrilation, symptomatic bradycardia status post permanent pacemaker, frequent PVCs, chronic respiratory failure with hypoxia on home O2, COPD, hypertension, hyperlipidemia, obesity, type 2 diabetes mellitus, traumatic subarachnoid hemorrhage in the setting of Xarelto, left lower extremity DVT status post IVC filter, chronic right sided atypical  chest pain, chronic left lower extremity lymphedema, prior stroke, sleep apnea, and obesity, who is being seen for the evaluation ofacute on chronic combined heart failure in the setting of persistent atrial fibrillation  Assessment & Plan    1.   Acute renal failure due to ATN:    Would continue to hold diuretics Possible component of ATN Would avoid IV fluids if possible  2.  Acute on chronic combined systolic and diastolic CHF:  Stable, now back in NSR which should assist with recovery Lasix on hold  ACE and arb on hold for renal failure  3.  Persistent AF: Cardioversion yesterday NSR restored, Continue amiodarone oral dosing BID Not a good candidate with renal dysfunction for eliquis or xarelto at this time consider starting warfarin, continue heparin bridge  4.  Mod to Sev MR:   Consider repeat limited echo now that she is in NSR following cardioversion  5.  PAH:    Cont sildenafil.   Further diuresis limited by renal failure  dispo  Very weak, will need aggressive physical therapy,  Lives alone Previously went to rehab for 1 week ([facility]), then went home but was still weak   Total encounter time more than 25 minutes  Greater than 50% was spent in counseling and coordination of care with the patient   Signed, Ida Rogue, MD  10/13/2017, 7:37 AM    For questions or updates, please contact   Please consult [email or web address] for contact info under Cardiology/STEMI.

## 2017-10-13 NOTE — Progress Notes (Signed)
[facility] for heparin Indication: atrial fibrillation  Allergies  Allergen Reactions  . Aspirin Hives and Other (See Comments)    Pt states that she is unable to take because she had bleeding in her brain.    . Other Other (See Comments)    Pt states that she is unable to take blood thinners because she had bleeding in her brain.  Blood Thinners-per doctor at [facility]  . Penicillins Hives, Shortness Of Breath, Swelling and Other (See Comments)    Has patient had a PCN reaction causing immediate rash, facial/tongue/throat swelling, SOB or lightheadedness with hypotension: Yes Has patient had a PCN reaction causing severe rash involving mucus membranes or skin necrosis: No Has patient had a PCN reaction that required hospitalization No Has patient had a PCN reaction occurring within the last 10 years: No If all of the above answers are "NO", then may proceed with Cephalosporin use.    Patient Measurements: Height: 5\' 1"  (154.9 cm) Weight: 247 lb 6.4 oz (112.2 kg) IBW/kg (Calculated) : 47.8 Heparin Dosing Weight: 74 kg  Vital Signs: BP: 126/64 (04/23 1543) Pulse Rate: 62 (04/23 1543)  Labs: Recent Labs    10/11/17 0423 10/12/17 0417 10/12/17 0611 10/13/17 0524 10/13/17 1317  HGB 9.8* 9.9*  --  10.0*  --   HCT 30.5* 31.2*  --  30.4*  --   PLT 206 207  --  211  --   HEPARINUNFRC 0.51 2.74* 0.58 0.71* 0.52  CREATININE 2.57*  --   --  2.70*  --     Estimated Creatinine Clearance: 21.2 mL/min (A) (by C-G formula based on SCr of 2.7 mg/dL (H)).   Medical History: Past Medical History:  Diagnosis Date  . Arthritis   . Atrial fibrillation, persistent ([place])    a. s/p TEE-guided DCCV on 08/20/2017  . Chest pain    a. 01/2012 Cath: nonobs dzs;  c. 04/2014 MV: No ischemia.  . Chronic back pain   . Chronic combined systolic (congestive) and diastolic (congestive) heart failure ([place])    a. Dx 2005 @ [facility];  b. 02/2014 Echo: Ef 55-60%, no  rwma, mild MR, PASP 77mmHg. c. 07/2017: EF 30-35%, diffuse HK, trivial AI, severe MR, moderate TR  . Chronic respiratory failure ([facility])    a. on home O2  . COPD (chronic obstructive pulmonary disease) ([place])    a. Home O2 - 3lpm  . Gastritis   . History of intracranial hemorrhage    a. 6/14 described by neurosurgery as "small frontal posttraumatic SAH " - pt was on xarelto @ the time.  . Hyperlipidemia   . Hypertension   . Lipoma of back   . Lymphedema    a. Chronic LLE edema.  . Obesity   . Sepsis with metabolic encephalopathy ([place])   . Sleep apnea   . Streptococcal bacteremia   . Stroke ([place])   . Symptomatic bradycardia    a. s/p BSX PPM.  . Thyroid nodule   . Type II diabetes mellitus ([place])   . Urinary incontinence     Medications:  Patient takes apixaban 5mg  BID at home for afib. She was initially started on apixaban 5mg  BID when she was admitted, but her dose was changed to apixaban 2.5mg  BID on 4/14 due to her increase in Scr (1.4 --> 3.1). Cardiology stated that decrease in apixaban dose was for instability of patient's renal function.   Assessment: Pharmacy consulted for heparin drip management for 72  yo female transitioned from apixaban to heparin secondary to decline in renal function. Patient with past medical history significant for atrial fibrillation and heart failure. Patient currently receiving heparin at 850 units/hr.  Goal of Therapy:  Heparin level 0.3-0.7 units/ml  APTT 66-102s Monitor platelets by anticoagulation protocol: Yes   Plan:  Will continue heparin infusion at 850 units/hr and recheck in AM.   Ulice Dash, PharmD Clinical Pharmacist  10/13/2017

## 2017-10-13 NOTE — Progress Notes (Signed)
Nutrition Follow Up Note   DOCUMENTATION CODES:   Morbid obesity  INTERVENTION:   MVI daily  Regular diet   NUTRITION DIAGNOSIS:   Inadequate oral intake related to acute illness as evidenced by per patient/family report  GOAL:   Patient will meet greater than or equal to 90% of their needs  -progressing   MONITOR:   PO intake, Labs, Weight trends, Skin, I & O's  ASSESSMENT:   72 y.o. female with a PMHx ofchronic diastolic heart failure, atrial fibrillation, chronic hypercarbic respiratory failure, COPD, morbid obesity, hyperlipidemia, diabetes mellitus type 2, chronic kidney disease stage II,historyof intracranial hemorrhagewho was admitted [facility] evaluation of shortness of breath.    Pt with improved appetite and oral intake; eating 75% of regular diet yesterday. Pt does not like any of the supplements that have been ordered for her; will discontinue them. Pt declines offer for snacks also. Continue MVI. Per chart, pt with ~12lb weight gain since admit. HD being held; dialysis cath removed. Pt s/p cardioversion 4/22.     Medications reviewed and include: doxycycline, insulin, solu-medrol, MVI, heparin  Labs reviewed: BUN 102(H), creat 2.70(H) P 5.1(H)- 4/20 Hgb 10.0(L), Hct 30.4(L) cbgs- 206, 235 x 48hrs AIC 5.8(H)- 4/10  Diet Order:  Diet regular Room service appropriate? Yes; Fluid consistency: Thin  EDUCATION NEEDS:   Education needs have been addressed  Skin:  Skin Assessment: Reviewed RN Assessment  Last BM:  4/22- type 6  Height:   Ht Readings from Last 1 Encounters:  10/11/17 5\' 1"  (1.549 m)    Weight:   Wt Readings from Last 1 Encounters:  10/13/17 247 lb 6.4 oz (112.2 kg)    Ideal Body Weight:  47.7 kg  BMI:  Body mass index is 46.75 kg/m.  Estimated Nutritional Needs:   Kcal:  1700-2000kcal/day   Protein:  88-110g/day   Fluid:  >1.4L/day or per MD  Koleen Distance MS, RD, LDN Pager #[phone] After Hours  Pager: [phone]

## 2017-10-13 NOTE — Progress Notes (Signed)
Inpatient Diabetes Program Recommendations  AACE/ADA: New Consensus Statement on Inpatient Glycemic Control (2015)  Target Ranges:  Prepandial:   less than 140 mg/dL      [facility] postprandial:   less than 180 mg/dL (1-2 hours)      Critically ill patients:  140 - 180 mg/dL  Results for RHEDA, KASSAB (MRN [id number]) as of 10/13/2017 08:25  Ref. Range 10/12/2017 08:25 10/12/2017 11:35 10/12/2017 16:25 10/12/2017 21:09 10/13/2017 07:47  Glucose-Capillary Latest Ref Range: 65 - 99 mg/dL 235 (H) 282 (H) 340 (H) 271 (H) 231 (H)   Results for DRENDA, SOBECKI (MRN [id number]) as of 10/13/2017 08:25  Ref. Range 10/11/2017 07:40 10/11/2017 11:55 10/11/2017 17:42 10/11/2017 21:09  Glucose-Capillary Latest Ref Range: 65 - 99 mg/dL 175 (H) 354 (H) 325 (H) 237 (H)    Results for LAWRENCIA, MAUNEY (MRN [id number]) as of 10/12/2017 12:48  Ref. Range 08/16/2017 14:41 09/30/2017 19:24  Hemoglobin A1C Latest Ref Range: 4.8 - 5.6 % 5.6 5.8 (H)   Review of Glycemic Control  Diabetes history: DM2 Outpatient Diabetes medications: Metformin 1021 mg BID, [name] 1.17 mg weekly Current orders for Inpatient glycemic control: Novolog 0-15 units TID with meals, Novolog 0-5 units QHS; Solumedrol 40 mg Q12H  Inpatient Diabetes Program Recommendations:  Insulin - Basal: If steroids are continued, please consider ordering Lantus 10 units Q24H starting now (based on 110 kg x 0.1 units). Insulin - Meal Coverage: If steroids are continued and post prandial glucose is consistently greater than 180 mg/dl, please consider ordering Novolog 3 units TID with meals for meal coverage if patient eats at least 50% of meals. Diet: Please discontinue Regular diet and order Carb Modified diet.  Thanks, Barnie Alderman, RN, MSN, CDE Diabetes Coordinator Inpatient Diabetes Program [phone] (Team Pager from 8am to 5pm)

## 2017-10-13 NOTE — Progress Notes (Signed)
Patient ID: Debra Saunders, female   DOB: 06-09-1946, 72 y.o.   MRN: [id number]   [facility] PROGRESS NOTE  Debra Saunders IWL:[id number] DOB: 1945/10/11 DOA: 09/30/2017 PCP: [name], MD  HPI/Subjective: Patient complains of significant swelling in her thighs   Vitals:   10/13/17 0318 10/13/17 0838  BP: 114/66 120/69  Pulse: 60 61  Resp: 18 18  Temp: 98.1 F (36.7 C)   SpO2: 95% 100%    Filed Weights   10/11/17 1410 10/12/17 0413 10/13/17 0318  Weight: 110.8 kg (244 lb 4.3 oz) 111.6 kg (246 lb) 112.2 kg (247 lb 6.4 oz)     CONSTITUTIONAL: No documented fever. No fatigue, weakness. No weight gain, no weight loss.  EYES: No blurry or double vision.  ENT: No tinnitus. No postnasal drip. No redness of the oropharynx.  RESPIRATORY: No cough, no wheeze, no hemoptysis.  Positive dyspnea.  CARDIOVASCULAR: No chest pain. No orthopnea. No palpitations. No syncope.  GASTROINTESTINAL: No nausea, no vomiting or diarrhea. No abdominal pain. No melena or hematochezia.  GENITOURINARY:  No urgency. No frequency. No dysuria. No hematuria. No obstructive symptoms. No discharge. No pain. No significant abnormal bleeding ENDOCRINE: No polyuria or nocturia. No heat or cold intolerance.  HEMATOLOGY: No anemia. No bruising. No bleeding. No purpura. No petechiae INTEGUMENTARY: No rashes. No lesions.  MUSCULOSKELETAL: No arthritis. No swelling. No gout.  NEUROLOGIC: No numbness, tingling, or ataxia. No seizure-type activity.  PSYCHIATRIC: No anxiety. No insomnia. No ADD.     Exam: Physical Exam  Constitutional: She is oriented to person, place, and time.  HENT:  Nose: No mucosal edema.  Mouth/Throat: No oropharyngeal exudate.  Eyes: Pupils are equal, round, and reactive to light. Conjunctivae, EOM and lids are normal.  Neck: No JVD present. Carotid bruit is not present. No edema present. No thyroid mass and no thyromegaly present.  Cardiovascular: Regular rhythm, S1 normal  and S2 normal. Exam reveals no gallop.  No murmur heard. Pulses:      Dorsalis pedis pulses are 2+ on the right side, and 2+ on the left side.  Respiratory: Accessory muscle usage present. She is in respiratory distress. She has decreased breath sounds in the right middle field, the right lower field, the left middle field and the left lower field. She has no wheezes. She has rhonchi in the right middle field and the left middle field. She has rales in the right lower field and the left lower field.  GI: Soft. Bowel sounds are normal. There is no tenderness.  Musculoskeletal:       Right ankle: She exhibits swelling.       Left ankle: She exhibits swelling.  Lymphadenopathy:    She has no cervical adenopathy.  Neurological: She is alert and oriented to person, place, and time. No cranial nerve deficit.  Skin: Skin is warm. No rash noted. Nails show no clubbing.  Psychiatric: She has a normal mood and affect.      Data Reviewed: Basic Metabolic Panel: Recent Labs  Lab 10/07/17 0608 10/09/17 0207 10/09/17 0411 10/10/17 0359 10/11/17 0423 10/13/17 0524  NA 135 138  --  134* 134* 135  K 4.5 4.4  --  4.5 4.0 4.3  CL 98* 101  --  99* 99* 101  CO2 28 28  --  29 27 27   GLUCOSE 128* 216*  --  216* 206* 235*  BUN 76* 46*  --  68* 82* 102*  CREATININE 3.76* 1.82*  --  2.10* 2.57* 2.70*  CALCIUM 8.3* 9.3  --  9.3 9.1 9.4  MG  --   --  1.8 1.9  --   --   PHOS 5.3*  --  5.2* 5.1*  --   --    Acute kidney injury CBC: Recent Labs  Lab 10/09/17 0207 10/10/17 0359 10/11/17 0423 10/12/17 0417 10/13/17 0524  WBC 7.9 8.4 10.1 8.9 9.6  HGB 10.0* 9.8* 9.8* 9.9* 10.0*  HCT 31.1* 30.2* 30.5* 31.2* 30.4*  MCV 84.6 84.9 85.0 85.5 84.8  PLT 220 213 206 207 211   BNP (last 3 results) Recent Labs    08/04/17 1552 08/16/17 0925 09/30/17 1545  BNP 175.0* 655.0* 1,151.0*     CBG: Recent Labs  Lab 10/12/17 1135 10/12/17 1625 10/12/17 2109 10/13/17 0747 10/13/17 1120  GLUCAP 282*  340* 271* 231* 259*    No results found for this or any previous visit (from the past 240 hour(s)).    Scheduled Meds: . amiodarone  200 mg Oral BID  . benzonatate  100 mg Oral TID  . budesonide (PULMICORT) nebulizer solution  0.5 mg Nebulization BID  . chlorhexidine  15 mL Mouth Rinse BID  . clopidogrel  75 mg Oral Daily  . doxycycline  100 mg Oral Q12H  . gabapentin  300 mg Oral QHS  . insulin aspart  0-15 Units Subcutaneous TID WC  . insulin aspart  0-5 Units Subcutaneous QHS  . ipratropium-albuterol  3 mL Nebulization TID  . mouth rinse  15 mL Mouth Rinse q12n4p  . methylPREDNISolone (SOLU-MEDROL) injection  40 mg Intravenous Q12H  . multivitamin with minerals  1 tablet Oral Daily  . mupirocin ointment   Nasal BID  . rosuvastatin  10 mg Oral Daily  . sildenafil  20 mg Oral TID  . sodium chloride flush  3 mL Intravenous Q12H   Continuous Infusions: . sodium chloride Stopped (10/12/17 0830)  . heparin 850 Units/hr (10/13/17 0737)    Assessment/Plan:  1. Acute respiratory failure in a setting of acute on chronic diastolic CHF with flash pulmonary edema , patient's breathing is improved, nephrology may start some low-dose Lasix to see if she responds 2. Acute kidney injury on CKD stage 3.    May need hemodialysis 3. Acute non-ST MI continue IV heparin  4. acute on chronic diastolic congestive heart failure.  Patient has moderate to severe mitral valve regurgitation.     Lasix may be started low-dose 5. Chronic atrial fibrillation status post cardioversion.   6. chronic hypoxic hypercarbic respiratory failure.  Chronically on 4 L of oxygen. 7. Morbid obesity.  Weight loss needed 8. COPD exacerbation, continue nebulizer treatments.    9. Hyperlipidemia unspecified on Crestor 10. Type 2 diabetes mellitus on sliding scale insulin 11. Upper respiratory tract infection on oral doxycycline 12. Hyperlipidemia unspecified on Crestor 13. No further epistaxsis 14. No further rectal  bleeding    Code Status:     Code Status Orders  (From admission, onward)        Start     Ordered   09/30/17 1901  Full code  Continuous     09/30/17 1901    Code Status History    Date Active Date Inactive Code Status Order ID Comments User Context   08/16/2017 1614 08/28/2017 2239 Full Code [id number]  Gorden Harms, MD Inpatient   03/30/2015 2152 04/03/2015 2122 Full Code [id number]  Etta Quill, DO Inpatient   03/27/2015 2208 03/28/2015 1737 Full Code [id number]  Nicholes Mango, MD ED   01/09/2014 1418 01/10/2014 1803 Full Code [id number]  Deboraha Sprang, MD Inpatient   01/16/2013 2335 01/27/2013 2011 Full Code [id number]  Collene Gobble, MD Inpatient     Disposition Plan:  TBD  Consultants:  Cardiology  Time spent: 35 minutes, case discussed with daughter carmen on phone, critical care specialist and nephrology  [facility]

## 2017-10-14 DIAGNOSIS — I272 Pulmonary hypertension, unspecified: Secondary | ICD-10-CM

## 2017-10-14 DIAGNOSIS — I5022 Chronic systolic (congestive) heart failure: Secondary | ICD-10-CM

## 2017-10-14 LAB — RENAL FUNCTION PANEL
Albumin: 3.3 g/dL — ABNORMAL LOW (ref 3.5–5.0)
Anion gap: 9 (ref 5–15)
BUN: 103 mg/dL — ABNORMAL HIGH (ref 6–20)
CO2: 25 mmol/L (ref 22–32)
Calcium: 9.6 mg/dL (ref 8.9–10.3)
Chloride: 101 mmol/L (ref 101–111)
Creatinine, Ser: 2.26 mg/dL — ABNORMAL HIGH (ref 0.44–1.00)
GFR calc Af Amer: 23 mL/min — ABNORMAL LOW (ref >60)
GFR calc non Af Amer: 20 mL/min — ABNORMAL LOW (ref >60)
Glucose, Bld: 233 mg/dL — ABNORMAL HIGH (ref 65–99)
Phosphorus: 5.6 mg/dL — ABNORMAL HIGH (ref 2.5–4.6)
Potassium: 4.6 mmol/L (ref 3.5–5.1)
Sodium: 135 mmol/L (ref 135–145)

## 2017-10-14 LAB — GLUCOSE, CAPILLARY
Glucose-Capillary: 216 mg/dL — ABNORMAL HIGH (ref 65–99)
Glucose-Capillary: 232 mg/dL — ABNORMAL HIGH (ref 65–99)
Glucose-Capillary: 205 mg/dL — ABNORMAL HIGH (ref 65–99)
Glucose-Capillary: 172 mg/dL — ABNORMAL HIGH (ref 65–99)

## 2017-10-14 LAB — CBC
HCT: 31.2 % — ABNORMAL LOW (ref 35.0–47.0)
Hemoglobin: 10.1 g/dL — ABNORMAL LOW (ref 12.0–16.0)
MCH: 27.8 pg (ref 26.0–34.0)
MCHC: 32.3 g/dL (ref 32.0–36.0)
MCV: 86 fL (ref 80.0–100.0)
Platelets: 205 10*3/uL (ref 150–440)
RBC: 3.63 MIL/uL — ABNORMAL LOW (ref 3.80–5.20)
RDW: 16.8 % — ABNORMAL HIGH (ref 11.5–14.5)
WBC: 9.2 10*3/uL (ref 3.6–11.0)

## 2017-10-14 LAB — HEPARIN LEVEL (UNFRACTIONATED): Heparin Unfractionated: 0.56 IU/mL (ref 0.30–0.70)

## 2017-10-14 MED ORDER — INSULIN GLARGINE 100 UNIT/ML ~~LOC~~ SOLN
10.0000 [IU] | SUBCUTANEOUS | Status: DC
  Administered 2017-10-14 – 2017-10-15 (×2): 10 [IU] via SUBCUTANEOUS
  Filled 2017-10-14 (×3): qty 0.1

## 2017-10-14 MED ORDER — FUROSEMIDE 20 MG PO TABS
20.0000 mg | ORAL_TABLET | Freq: Every day | ORAL | Status: DC
  Administered 2017-10-14 – 2017-10-15 (×2): 20 mg via ORAL
  Filled 2017-10-14 (×2): qty 1

## 2017-10-14 MED ORDER — INSULIN ASPART 100 UNIT/ML ~~LOC~~ SOLN
3.0000 [IU] | Freq: Three times a day (TID) | SUBCUTANEOUS | Status: DC
  Administered 2017-10-14 – 2017-10-16 (×5): 3 [IU] via SUBCUTANEOUS
  Filled 2017-10-14 (×5): qty 1

## 2017-10-14 MED ORDER — FUROSEMIDE 40 MG PO TABS: 40.0000 mg | ORAL_TABLET | Freq: Every day | ORAL | Status: DC

## 2017-10-14 NOTE — Progress Notes (Signed)
[place] for heparin Indication: atrial fibrillation  Allergies  Allergen Reactions  . Aspirin Hives and Other (See Comments)    Pt states that she is unable to take because she had bleeding in her brain.    . Other Other (See Comments)    Pt states that she is unable to take blood thinners because she had bleeding in her brain.  Blood Thinners-per doctor at [facility]  . Penicillins Hives, Shortness Of Breath, Swelling and Other (See Comments)    Has patient had a PCN reaction causing immediate rash, facial/tongue/throat swelling, SOB or lightheadedness with hypotension: Yes Has patient had a PCN reaction causing severe rash involving mucus membranes or skin necrosis: No Has patient had a PCN reaction that required hospitalization No Has patient had a PCN reaction occurring within the last 10 years: No If all of the above answers are "NO", then may proceed with Cephalosporin use.    Patient Measurements: Height: 5\' 1"  (154.9 cm) Weight: 247 lb 6.4 oz (112.2 kg) IBW/kg (Calculated) : 47.8 Heparin Dosing Weight: 74 kg  Vital Signs: Temp: 98.5 F (36.9 C) (04/24 0329) Temp Source: Oral (04/23 1930) BP: 127/72 (04/24 0329) Pulse Rate: 61 (04/24 0329)  Labs: Recent Labs    10/12/17 0417  10/13/17 0524 10/13/17 1317 10/14/17 0336  HGB 9.9*  --  10.0*  --  10.1*  HCT 31.2*  --  30.4*  --  31.2*  PLT 207  --  211  --  205  HEPARINUNFRC 2.74*   < > 0.71* 0.52 0.56  CREATININE  --   --  2.70*  --   --    < > = values in this interval not displayed.    Estimated Creatinine Clearance: 21.2 mL/min (A) (by C-G formula based on SCr of 2.7 mg/dL (H)).   Medical History: Past Medical History:  Diagnosis Date  . Arthritis   . Atrial fibrillation, persistent ([place])    a. s/p TEE-guided DCCV on 08/20/2017  . Chest pain    a. 01/2012 Cath: nonobs dzs;  c. 04/2014 MV: No ischemia.  . Chronic back pain   . Chronic combined systolic  (congestive) and diastolic (congestive) heart failure ([place])    a. Dx 2005 @ [facility];  b. 02/2014 Echo: Ef 55-60%, no rwma, mild MR, PASP 16mmHg. c. 07/2017: EF 30-35%, diffuse HK, trivial AI, severe MR, moderate TR  . Chronic respiratory failure (H[facility])    a. on home O2  . COPD (chronic obstructive pulmonary disease) ([place])    a. Home O2 - 3lpm  . Gastritis   . History of intracranial hemorrhage    a. 6/14 described by neurosurgery as "small frontal posttraumatic SAH " - pt was on xarelto @ the time.  . Hyperlipidemia   . Hypertension   . Lipoma of back   . Lymphedema    a. Chronic LLE edema.  . Obesity   . Sepsis with metabolic encephalopathy ([place])   . Sleep apnea   . Streptococcal bacteremia   . Stroke ([place])   . Symptomatic bradycardia    a. s/p BSX PPM.  . Thyroid nodule   . Type II diabetes mellitus ([place])   . Urinary incontinence     Medications:  Patient takes apixaban 5mg  BID at home for afib. She was initially started on apixaban 5mg  BID when she was admitted, but her dose was changed to apixaban 2.5mg  BID on 4/14 due to her increase in Scr (1.4 -->  3.1). Cardiology stated that decrease in apixaban dose was for instability of patient's renal function.   Assessment: Pharmacy consulted for heparin drip management for 72 yo female transitioned from apixaban to heparin secondary to decline in renal function. Patient with past medical history significant for atrial fibrillation and heart failure. Patient currently receiving heparin at 850 units/hr.  Goal of Therapy:  Heparin level 0.3-0.7 units/ml  APTT 66-102s Monitor platelets by anticoagulation protocol: Yes   Plan:  Will continue heparin infusion at 850 units/hr and recheck in AM.   04/24 @ 0300 HL 0.56 therapeutic. Will continue current rate and will recheck HL/CBC w/ am labs.  Tobie Lords, PharmD, BCPS Clinical Pharmacist 10/14/2017

## 2017-10-14 NOTE — Progress Notes (Signed)
Patient with pretty significant nose bleed. Placed patient on 35% face bucket. o2 saturation noted at 100%. bipap on standby for now.

## 2017-10-14 NOTE — Progress Notes (Signed)
Patient ID: Debra Saunders, female   DOB: 04-05-1946, 72 y.o.   MRN: [id number]   [facility] PROGRESS NOTE  KYRI SHADER RAQ:[id number] DOB: 04/14/1946 DOA: 09/30/2017 PCP: [name], MD  HPI/Subjective: Patient continues to complain of significant swelling denies any chest pain or palpitations   Vitals:   10/14/17 0329 10/14/17 0822  BP: 127/72 (!) 153/89  Pulse: 61 60  Resp: 18   Temp: 98.5 F (36.9 C) (!) 97.5 F (36.4 C)  SpO2: 99% 95%    Filed Weights   10/12/17 0413 10/13/17 0318 10/14/17 0329  Weight: 111.6 kg (246 lb) 112.2 kg (247 lb 6.4 oz) 113.9 kg (251 lb)     CONSTITUTIONAL: No documented fever. No fatigue, weakness. No weight gain, no weight loss.  EYES: No blurry or double vision.  ENT: No tinnitus. No postnasal drip. No redness of the oropharynx.  RESPIRATORY: No cough, no wheeze, no hemoptysis.  Positive dyspnea.  CARDIOVASCULAR: No chest pain. No orthopnea. No palpitations. No syncope.  GASTROINTESTINAL: No nausea, no vomiting or diarrhea. No abdominal pain. No melena or hematochezia.  GENITOURINARY:  No urgency. No frequency. No dysuria. No hematuria. No obstructive symptoms. No discharge. No pain. No significant abnormal bleeding ENDOCRINE: No polyuria or nocturia. No heat or cold intolerance.  HEMATOLOGY: No anemia. No bruising. No bleeding. No purpura. No petechiae INTEGUMENTARY: No rashes. No lesions.  MUSCULOSKELETAL: No arthritis. No swelling. No gout.  NEUROLOGIC: No numbness, tingling, or ataxia. No seizure-type activity.  PSYCHIATRIC: No anxiety. No insomnia. No ADD.     Exam: Physical Exam  Constitutional: She is oriented to person, place, and time.  HENT:  Nose: No mucosal edema.  Mouth/Throat: No oropharyngeal exudate.  Eyes: Pupils are equal, round, and reactive to light. Conjunctivae, EOM and lids are normal.  Neck: No JVD present. Carotid bruit is not present. No edema present. No thyroid mass and no thyromegaly  present.  Cardiovascular: Regular rhythm, S1 normal and S2 normal. Exam reveals no gallop.  No murmur heard. Pulses:      Dorsalis pedis pulses are 2+ on the right side, and 2+ on the left side.  Respiratory: No accessory muscle usage. No respiratory distress. She has decreased breath sounds. She has no wheezes. She has no rhonchi. She has no rales.  GI: Soft. Bowel sounds are normal. There is no tenderness.  Musculoskeletal:       Right ankle: She exhibits swelling.       Left ankle: She exhibits swelling.  Lymphadenopathy:    She has no cervical adenopathy.  Neurological: She is alert and oriented to person, place, and time. No cranial nerve deficit.  Skin: Skin is warm. No rash noted. Nails show no clubbing.  Psychiatric: She has a normal mood and affect.      Data Reviewed: Basic Metabolic Panel: Recent Labs  Lab 10/09/17 0207 10/09/17 0411 10/10/17 0359 10/11/17 0423 10/13/17 0524 10/14/17 1029  NA 138  --  134* 134* 135 135  K 4.4  --  4.5 4.0 4.3 4.6  CL 101  --  99* 99* 101 101  CO2 28  --  29 27 27 25   GLUCOSE 216*  --  216* 206* 235* 233*  BUN 46*  --  68* 82* 102* 103*  CREATININE 1.82*  --  2.10* 2.57* 2.70* 2.26*  CALCIUM 9.3  --  9.3 9.1 9.4 9.6  MG  --  1.8 1.9  --   --   --  PHOS  --  5.2* 5.1*  --   --  5.6*   Acute kidney injury CBC: Recent Labs  Lab 10/10/17 0359 10/11/17 0423 10/12/17 0417 10/13/17 0524 10/14/17 0336  WBC 8.4 10.1 8.9 9.6 9.2  HGB 9.8* 9.8* 9.9* 10.0* 10.1*  HCT 30.2* 30.5* 31.2* 30.4* 31.2*  MCV 84.9 85.0 85.5 84.8 86.0  PLT 213 206 207 211 205   BNP (last 3 results) Recent Labs    08/04/17 1552 08/16/17 0925 09/30/17 1545  BNP 175.0* 655.0* 1,151.0*     CBG: Recent Labs  Lab 10/13/17 0747 10/13/17 1120 10/13/17 1644 10/14/17 0817 10/14/17 1221  GLUCAP 231* 259* 348* 216* 232*    No results found for this or any previous visit (from the past 240 hour(s)).    Scheduled Meds: . amiodarone  200 mg Oral  BID  . benzonatate  100 mg Oral TID  . budesonide (PULMICORT) nebulizer solution  0.5 mg Nebulization BID  . chlorhexidine  15 mL Mouth Rinse BID  . clopidogrel  75 mg Oral Daily  . doxycycline  100 mg Oral Q12H  . furosemide  20 mg Oral Daily  . gabapentin  300 mg Oral QHS  . insulin aspart  0-15 Units Subcutaneous TID WC  . insulin aspart  0-5 Units Subcutaneous QHS  . ipratropium-albuterol  3 mL Nebulization TID  . mouth rinse  15 mL Mouth Rinse q12n4p  . methylPREDNISolone (SOLU-MEDROL) injection  40 mg Intravenous Q12H  . multivitamin with minerals  1 tablet Oral Daily  . mupirocin ointment   Nasal BID  . rosuvastatin  10 mg Oral Daily  . sildenafil  20 mg Oral TID  . sodium chloride flush  3 mL Intravenous Q12H   Continuous Infusions: . sodium chloride Stopped (10/12/17 0830)  . heparin 850 Units/hr (10/14/17 0824)    Assessment/Plan:  1. Acute respiratory failure in a setting of acute on chronic diastolic CHF with flash pulmonary edema , patient will be started on low-dose Lasix to see the response if no significant improvement may need hemodialysis 2. Acute kidney injury on CKD stage 3.    May need hemodialysis 3. Acute non-ST MI continue IV heparin can transition to oral Coumadin once decided if dialysis is needed or not 4. acute on chronic diastolic congestive heart failure.  Patient has moderate to severe mitral valve regurgitation.     Lasix may be started low-dose 5. Chronic atrial fibrillation status post cardioversion.   6. chronic hypoxic hypercarbic respiratory failure.  Chronically on 4 L of oxygen. 7. Morbid obesity.  Weight loss needed 8. COPD exacerbation, continue nebulizer treatments.    9. Hyperlipidemia unspecified on Crestor 10. Type 2 diabetes mellitus on sliding scale insulin, change diet to carbohydrate consistent diet further recommendations per diabetic coordinator 11. Upper respiratory tract infection  status post treatment with oral  antibiotic 12. Hyperlipidemia unspecified on Crestor     Code Status:     Code Status Orders  (From admission, onward)        Start     Ordered   09/30/17 1901  Full code  Continuous     09/30/17 1901    Code Status History    Date Active Date Inactive Code Status Order ID Comments User Context   08/16/2017 1614 08/28/2017 2239 Full Code [id number]  Gorden Harms, MD Inpatient   03/30/2015 2152 04/03/2015 2122 Full Code [id number]  Etta Quill, DO Inpatient   03/27/2015 2208 03/28/2015 1737 Full Code [id number]  Nicholes Mango, MD ED   01/09/2014 1418 01/10/2014 1803 Full Code [id number]  Deboraha Sprang, MD Inpatient   01/16/2013 2335 01/27/2013 2011 Full Code [id number]  Collene Gobble, MD Inpatient     Disposition Plan:  TBD  Consultants:  Cardiology  Time spent: 35 minutes, case discussed with daughter carmen on phone, critical care specialist and nephrology  [facility]

## 2017-10-14 NOTE — Progress Notes (Signed)
[facility]  ROUNDING NOTE   Subjective:   Epistaxis last night. Did not sleep well.   Echo with EF of 40-45%. Elevated right heart pressures.   Objective:  Vital signs in last 24 hours:  Temp:  [97.5 F (36.4 C)-98.5 F (36.9 C)] 97.5 F (36.4 C) (04/24 0822) Pulse Rate:  [60-62] 60 (04/24 0822) Resp:  [18] 18 (04/24 0329) BP: (126-153)/(63-89) 153/89 (04/24 0822) SpO2:  [95 %-100 %] 95 % (04/24 0822) Weight:  [113.9 kg (251 lb)] 113.9 kg (251 lb) (04/24 0329)  Weight change: 1.633 kg (3 lb 9.6 oz) Filed Weights   10/12/17 0413 10/13/17 0318 10/14/17 0329  Weight: 111.6 kg (246 lb) 112.2 kg (247 lb 6.4 oz) 113.9 kg (251 lb)    Intake/Output: I/O last 3 completed shifts: In: 478.6 [P.O.:240; I.V.:238.6] Out: 1100 [Urine:1100]   Intake/Output this shift:  No intake/output data recorded.  Physical Exam: General:  No acute distress  Head: Normocephalic, atraumatic.   Eyes: Anicteric  Neck: Supple, trachea midline  Lungs:  Scattered rhonchi, normal effort  Heart: S1S2 no rubs  Abdomen:  Soft, nontender, bowel sounds present  Extremities: 1+ peripheral edema.  Neurologic: Awake, alert, following commands  Skin: No lesions       Basic Metabolic Panel: Recent Labs  Lab 10/09/17 0207 10/09/17 0411 10/10/17 0359 10/11/17 0423 10/13/17 0524  NA 138  --  134* 134* 135  K 4.4  --  4.5 4.0 4.3  CL 101  --  99* 99* 101  CO2 28  --  29 27 27   GLUCOSE 216*  --  216* 206* 235*  BUN 46*  --  68* 82* 102*  CREATININE 1.82*  --  2.10* 2.57* 2.70*  CALCIUM 9.3  --  9.3 9.1 9.4  MG  --  1.8 1.9  --   --   PHOS  --  5.2* 5.1*  --   --     Liver Function Tests: No results for input(s): AST, ALT, ALKPHOS, BILITOT, PROT, ALBUMIN in the last 168 hours. No results for input(s): LIPASE, AMYLASE in the last 168 hours. No results for input(s): AMMONIA in the last 168 hours.  CBC: Recent Labs  Lab 10/10/17 0359 10/11/17 0423 10/12/17 0417 10/13/17 0524  10/14/17 0336  WBC 8.4 10.1 8.9 9.6 9.2  HGB 9.8* 9.8* 9.9* 10.0* 10.1*  HCT 30.2* 30.5* 31.2* 30.4* 31.2*  MCV 84.9 85.0 85.5 84.8 86.0  PLT 213 206 207 211 205    Cardiac Enzymes: Recent Labs  Lab 10/09/17 0834 10/09/17 1420  TROPONINI 2.70* 2.35*    BNP: Invalid input(s): POCBNP  CBG: Recent Labs  Lab 10/12/17 2109 10/13/17 0747 10/13/17 1120 10/13/17 1644 10/14/17 0817  GLUCAP 271* 231* 259* 348* 216*    Microbiology: Results for orders placed or performed during the hospital encounter of 09/30/17  MRSA PCR Screening     Status: Abnormal   Collection Time: 10/01/17  9:37 AM  Result Value Ref Range Status   MRSA by PCR POSITIVE (A) NEGATIVE Final    Comment:        [name] (FDA approved for NASAL specimens only), is one component of a comprehensive MRSA colonization surveillance program. It is not intended to diagnose MRSA infection nor to guide or monitor treatment for MRSA infections. RESULT CALLED TO, READ BACK BY AND VERIFIED WITH:  Gildardo Pounds AT 1207 10/01/17 SDR Performed at [facility], [address], [place], [place] [address]  Coagulation Studies: No results for input(s): LABPROT, INR in the last 72 hours.  Urinalysis: No results for input(s): COLORURINE, LABSPEC, PHURINE, GLUCOSEU, HGBUR, BILIRUBINUR, KETONESUR, PROTEINUR, UROBILINOGEN, NITRITE, LEUKOCYTESUR in the last 72 hours.  Invalid input(s): APPERANCEUR    Imaging: No results found.   Medications:   . sodium chloride Stopped (10/12/17 0830)  . heparin 850 Units/hr (10/14/17 0824)   . amiodarone  200 mg Oral BID  . benzonatate  100 mg Oral TID  . budesonide (PULMICORT) nebulizer solution  0.5 mg Nebulization BID  . chlorhexidine  15 mL Mouth Rinse BID  . clopidogrel  75 mg Oral Daily  . doxycycline  100 mg Oral Q12H  . furosemide  40 mg Oral Daily  . gabapentin  300 mg Oral QHS  . insulin aspart  0-15 Units Subcutaneous TID WC  .  insulin aspart  0-5 Units Subcutaneous QHS  . ipratropium-albuterol  3 mL Nebulization TID  . mouth rinse  15 mL Mouth Rinse q12n4p  . methylPREDNISolone (SOLU-MEDROL) injection  40 mg Intravenous Q12H  . multivitamin with minerals  1 tablet Oral Daily  . mupirocin ointment   Nasal BID  . rosuvastatin  10 mg Oral Daily  . sildenafil  20 mg Oral TID  . sodium chloride flush  3 mL Intravenous Q12H   sodium chloride, acetaminophen, ALPRAZolam, ipratropium-albuterol, menthol-cetylpyridinium, ondansetron (ZOFRAN) IV, ondansetron, promethazine, sodium chloride flush  Assessment/ Plan:  72 y.o.black female with a PMHx of chronic diastolic heart failure, atrial fibrillation, chronic hypercarbic respiratory failure, COPD, morbid obesity, hyperlipidemia, diabetes mellitus type 2, history of intracranial hemorrhage who was admitted to [facility] on 09/30/2017   1.  Acute renal failure: Acute renal failure secondary to acute cardiorenal syndrome and overdiuresis  Required two treatment of hemodialysis in the past. No acute indication for dialysis.  Creatinine continues to climb. BUN elevated, this could partially be due to systemic steroids. Pending labs for today - Continue to monitor urine output.  - Restart furosemide at 20mg  daily, will monitor closely. If diuretics do not work, dialysis is the next option.   2. Hypertension with atrial fibrillation: status post cardioversion on 4/22.  Acute exacerbation of systolic and diastolic congestive heart failure.  - amiodarone. - sildenafil for pulmonary hypertension.  - as above: furosemide.   2.  Anemia with renal failure: hemoglobin 10.1. No indication for epo  3.  Hyponatremia: chronic, secondary to cardiac status.     LOS: 14 Debra Saunders 4/24/201910:04 AM

## 2017-10-14 NOTE — Progress Notes (Signed)
Inpatient Diabetes Program Recommendations  AACE/ADA: New Consensus Statement on Inpatient Glycemic Control (2015)  Target Ranges:  Prepandial:   less than 140 mg/dL      Peak postprandial:   less than 180 mg/dL (1-2 hours)      Critically ill patients:  140 - 180 mg/dL  Results for ANNETTE, BERTELSON (MRN [id number]) as of 10/14/2017 10:27  Ref. Range 10/13/2017 07:47 10/13/2017 11:20 10/13/2017 16:44 10/14/2017 08:17  Glucose-Capillary Latest Ref Range: 65 - 99 mg/dL 231 (H) 259 (H) 348 (H) 216 (H)  Results for MARGUERITTE, GUTHRIDGE (MRN [id number]) as of 10/14/2017 10:27  Ref. Range 10/12/2017 08:25 10/12/2017 11:35 10/12/2017 16:25 10/12/2017 21:09  Glucose-Capillary Latest Ref Range: 65 - 99 mg/dL 235 (H) 282 (H) 340 (H) 271 (H)   Results for NABRIA, NEVIN (MRN [id number]) as of 10/13/2017 08:25  Ref. Range 10/11/2017 07:40 10/11/2017 11:55 10/11/2017 17:42 10/11/2017 21:09  Glucose-Capillary Latest Ref Range: 65 - 99 mg/dL 175 (H) 354 (H) 325 (H) 237 (H)    Results for ANNELISA, RYBACK (MRN [id number]) as of 10/12/2017 12:48  Ref. Range 08/16/2017 14:41 09/30/2017 19:24  Hemoglobin A1C Latest Ref Range: 4.8 - 5.6 % 5.6 5.8 (H)   Review of Glycemic Control  Diabetes history: DM2 Outpatient Diabetes medications: Metformin 9357 mg BID, [name] 0.17 mg weekly Current orders for Inpatient glycemic control: Novolog 0-15 units TID with meals, Novolog 0-5 units QHS; Solumedrol 40 mg Q12H  Inpatient Diabetes Program Recommendations:  Insulin - Basal: Glucose ranged from 231-348 mg/dl on 10/13/17 and fastin glucose is 216 mg/dl today. If steroids are continued, please consider ordering Lantus 10 units Q24H starting now (based on 110 kg x 0.1 units). Insulin - Meal Coverage: If steroids are continued, please consider ordering Novolog 3 units TID with meals for meal coverage if patient eats at least 50% of meals. Diet: Please discontinue Regular diet and order Carb Modified diet.  Thanks, Barnie Alderman, RN,  MSN, CDE Diabetes Coordinator Inpatient Diabetes Program [phone] (Team Pager from 8am to 5pm)

## 2017-10-14 NOTE — Progress Notes (Signed)
BiPAP contraindicated tonight due to recent nose bleed.

## 2017-10-14 NOTE — Progress Notes (Signed)
Progress Note  Patient Name: Debra Saunders Date of Encounter: 10/14/2017  Primary Cardiologist: [name], MD  Subjective   Difficult night overnight had nosebleed that did not stop,  Had bulb inflated into the right nare Breathing through respiratory [facility] Echocardiogram yesterday showing severely elevated right heart pressures She reports leg edema, shortness of breath  Inpatient Medications    Scheduled Meds: . amiodarone  200 mg Oral BID  . benzonatate  100 mg Oral TID  . budesonide (PULMICORT) nebulizer solution  0.5 mg Nebulization BID  . chlorhexidine  15 mL Mouth Rinse BID  . clopidogrel  75 mg Oral Daily  . doxycycline  100 mg Oral Q12H  . gabapentin  300 mg Oral QHS  . insulin aspart  0-15 Units Subcutaneous TID WC  . insulin aspart  0-5 Units Subcutaneous QHS  . ipratropium-albuterol  3 mL Nebulization TID  . mouth rinse  15 mL Mouth Rinse q12n4p  . methylPREDNISolone (SOLU-MEDROL) injection  40 mg Intravenous Q12H  . multivitamin with minerals  1 tablet Oral Daily  . mupirocin ointment   Nasal BID  . rosuvastatin  10 mg Oral Daily  . sildenafil  20 mg Oral TID  . sodium chloride flush  3 mL Intravenous Q12H   Continuous Infusions: . sodium chloride Stopped (10/12/17 0830)  . heparin 850 Units/hr (10/14/17 0824)   PRN Meds: sodium chloride, acetaminophen, ALPRAZolam, ipratropium-albuterol, menthol-cetylpyridinium, ondansetron (ZOFRAN) IV, ondansetron, promethazine, sodium chloride flush   Vital Signs    Vitals:   10/13/17 1930 10/13/17 2002 10/14/17 0329 10/14/17 0822  BP: 132/63  127/72 (!) 153/89  Pulse: 61  61 60  Resp: 18  18   Temp: 97.7 F (36.5 C)  98.5 F (36.9 C) (!) 97.5 F (36.4 C)  TempSrc: Oral   Oral  SpO2: 100% 96% 99% 95%  Weight:   251 lb (113.9 kg)   Height:        Intake/Output Summary (Last 24 hours) at 10/14/2017 0940 Last data filed at 10/14/2017 0300 Gross per 24 hour  Intake -  Output 900 ml  Net -900 ml    Filed Weights   10/12/17 0413 10/13/17 0318 10/14/17 0329  Weight: 246 lb (111.6 kg) 247 lb 6.4 oz (112.2 kg) 251 lb (113.9 kg)    Physical Exam   No significant change in exam Constitutional:  oriented to person, place, and time. No distress. obese.  HENT: Bulb in the right nare Head: Normocephalic and atraumatic.  Eyes:  no discharge. No scleral icterus.  Neck: Normal range of motion. Neck supple. No JVD present.  Cardiovascular: Normal rate, regular rhythm, normal heart sounds and intact distal pulses. Exam reveals no gallop and no friction rub. 2+  Pitting edema to the thighs No murmur heard. Pulmonary/Chest: moderately decreased BS throughout, rales at the bases,  No stridor. No respiratory distress.  no wheezes.  no tenderness.  Abdominal: Soft.  no distension.  no tenderness.  Musculoskeletal: Normal range of motion.  no  tenderness or deformity.  Neurological:  normal muscle tone. Coordination normal. No atrophy Skin: Skin is warm and dry. No rash noted. not diaphoretic.  Psychiatric:  normal mood and affect. behavior is normal. Thought content normal.      Labs    Chemistry Recent Labs  Lab 10/10/17 0359 10/11/17 0423 10/13/17 0524  NA 134* 134* 135  K 4.5 4.0 4.3  CL 99* 99* 101  CO2 29 27 27   GLUCOSE 216* 206* 235*  BUN  68* 82* 102*  CREATININE 2.10* 2.57* 2.70*  CALCIUM 9.3 9.1 9.4  GFRNONAA 22* 17* 16*  GFRAA 26* 20* 19*  ANIONGAP 6 8 7      Hematology Recent Labs  Lab 10/12/17 0417 10/13/17 0524 10/14/17 0336  WBC 8.9 9.6 9.2  RBC 3.65* 3.58* 3.63*  HGB 9.9* 10.0* 10.1*  HCT 31.2* 30.4* 31.2*  MCV 85.5 84.8 86.0  MCH 27.1 27.8 27.8  MCHC 31.6* 32.8 32.3  RDW 16.8* 16.7* 16.8*  PLT 207 211 205     Radiology    No results found.  Telemetry    Afib w/ V pacing on demand - Personally Reviewed EKG post cardioversion with NSR, pacing  Cardiac Studies   ECHO: 10/01/17 - Left ventricle: The cavity size was normal. There was  mild concentric hypertrophy. Systolic function was normal. The estimated ejection fraction was in the range of 50% to 55%. Wall motion was normal; there were no regional wall motion abnormalities. The study is not technically sufficient to allow evaluation of LV diastolic function. - Mitral valve: There was moderate to severe regurgitation. - Left atrium: The atrium was moderately dilated. - Right ventricle: The cavity size was mildly dilated. Wall thickness was normal. Systolic function was mildly reduced. - Right atrium: The atrium was moderately dilated. - Tricuspid valve: There was moderate regurgitation. - Pulmonary arteries: Systolic pressure was severely elevated PA peak pressure: 69 mm Hg (S).  Patient Profile     72 y.o. 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   Acute renal failure due to ATN:    Difficult situation, worsening renal dysfunction in the setting of markedly elevated right heart pressures Worsening renal dysfunc diuresis limited by renal failure tion despite holding aggressive diuresis Uncertain if there is a component of worsening renal failure, ATN, prerenal state --Further diuresis may be limited in benefits given severe right heart pressures --Needs treatment of pulmonary  hypertension if possible (consult pulmonary or consider transfer to [facility], Dr. Haroldine Laws)  2.  Acute on chronic combined systolic and diastolic CHF:  Difficult situation given underlying pulmonary hypertension Limited benefit in aggressive diuresis given markedly elevated right heart pressures ACE and arb on hold for renal failure  3.  Persistent AF: Cardioversion  restored, this admission Continue amiodarone oral dosing BID Would continue anticoagulation  4.  Mod to Sev MR:   Mitral valve regurgitation much improved on repeat echo and normal sinus rhythm  5.  PAH:    Cont sildenafil.   Diuresis limited by renal failure --- Needs pulmonary to weigh in on whether pulmonary hypertension medications can be used. Pulmonary hypertension started 2004 or before based on right heart pressures in 2004 --If not a candidate would consider dialysis and if no benefit there would have to consider palliative    Total encounter time more than 25 minutes  Greater than 50% was spent in counseling and coordination of care with the patient  Signed, Ida Rogue, MD  10/14/2017, 9:40 AM    For questions or updates, please contact   Please consult [email or web address] for contact info under Cardiology/STEMI. Difficult night overnight, had nosebleed that did not stop

## 2017-10-14 NOTE — Progress Notes (Signed)
Pt's nose still bleeding despite the humidity added to O2, nasal tampon placed in right nare for now to help control bleed. Pt continues to stay on face bucket for O2. Will continue to monitor. Conley Simmonds, RN, BSN

## 2017-10-15 ENCOUNTER — Ambulatory Visit: Payer: Medicare HMO | Admitting: Family

## 2017-10-15 DIAGNOSIS — I214 Non-ST elevation (NSTEMI) myocardial infarction: Secondary | ICD-10-CM

## 2017-10-15 DIAGNOSIS — J810 Acute pulmonary edema: Secondary | ICD-10-CM

## 2017-10-15 DIAGNOSIS — Z515 Encounter for palliative care: Secondary | ICD-10-CM

## 2017-10-15 DIAGNOSIS — I255 Ischemic cardiomyopathy: Secondary | ICD-10-CM

## 2017-10-15 DIAGNOSIS — Z7189 Other specified counseling: Secondary | ICD-10-CM

## 2017-10-15 LAB — BASIC METABOLIC PANEL

## 2017-10-15 LAB — CBC
HCT: 32.1 % — ABNORMAL LOW (ref 35.0–47.0)
Hemoglobin: 10.3 g/dL — ABNORMAL LOW (ref 12.0–16.0)
MCH: 27.6 pg (ref 26.0–34.0)
MCHC: 32 g/dL (ref 32.0–36.0)
MCV: 86.2 fL (ref 80.0–100.0)
Platelets: 202 10*3/uL (ref 150–440)
RBC: 3.72 MIL/uL — ABNORMAL LOW (ref 3.80–5.20)
RDW: 17.5 % — ABNORMAL HIGH (ref 11.5–14.5)
WBC: 9.3 10*3/uL (ref 3.6–11.0)

## 2017-10-15 LAB — ECHOCARDIOGRAM COMPLETE
Height: 61 in
Weight: 3657.6 oz

## 2017-10-15 LAB — RENAL FUNCTION PANEL
Albumin: 3.2 g/dL — ABNORMAL LOW (ref 3.5–5.0)
Anion gap: 8 (ref 5–15)
BUN: 99 mg/dL — ABNORMAL HIGH (ref 6–20)
CO2: 26 mmol/L (ref 22–32)
Calcium: 9.3 mg/dL (ref 8.9–10.3)
Chloride: 101 mmol/L (ref 101–111)
Creatinine, Ser: 2.07 mg/dL — ABNORMAL HIGH (ref 0.44–1.00)
GFR calc Af Amer: 26 mL/min — ABNORMAL LOW (ref >60)
GFR calc non Af Amer: 22 mL/min — ABNORMAL LOW (ref >60)
Glucose, Bld: 247 mg/dL — ABNORMAL HIGH (ref 65–99)
Phosphorus: 5 mg/dL — ABNORMAL HIGH (ref 2.5–4.6)
Potassium: 4.7 mmol/L (ref 3.5–5.1)
Sodium: 135 mmol/L (ref 135–145)

## 2017-10-15 LAB — GLUCOSE, CAPILLARY
Glucose-Capillary: 193 mg/dL — ABNORMAL HIGH (ref 65–99)
Glucose-Capillary: 155 mg/dL — ABNORMAL HIGH (ref 65–99)
Glucose-Capillary: 206 mg/dL — ABNORMAL HIGH (ref 65–99)
Glucose-Capillary: 173 mg/dL — ABNORMAL HIGH (ref 65–99)
Glucose-Capillary: 161 mg/dL — ABNORMAL HIGH (ref 65–99)

## 2017-10-15 LAB — HEPARIN LEVEL (UNFRACTIONATED): Heparin Unfractionated: 0.61 IU/mL (ref 0.30–0.70)

## 2017-10-15 MED ORDER — TORSEMIDE 20 MG PO TABS
20.0000 mg | ORAL_TABLET | Freq: Every day | ORAL | Status: DC
  Filled 2017-10-15: qty 1

## 2017-10-15 MED ORDER — ALUM & MAG HYDROXIDE-SIMETH 200-200-20 MG/5ML PO SUSP
30.0000 mL | Freq: Four times a day (QID) | ORAL | Status: DC | PRN
  Administered 2017-10-15 – 2017-10-17 (×3): 30 mL via ORAL
  Filled 2017-10-15 (×3): qty 30

## 2017-10-15 MED ORDER — PANTOPRAZOLE SODIUM 40 MG PO TBEC
40.0000 mg | DELAYED_RELEASE_TABLET | Freq: Every day | ORAL | Status: DC
  Administered 2017-10-16 – 2017-10-22 (×8): 40 mg via ORAL
  Filled 2017-10-15 (×9): qty 1

## 2017-10-15 MED ORDER — PANTOPRAZOLE SODIUM 40 MG PO TBEC: 40.0000 mg | DELAYED_RELEASE_TABLET | Freq: Every day | ORAL | Status: DC

## 2017-10-15 MED ORDER — BENZONATATE 100 MG PO CAPS
100.0000 mg | ORAL_CAPSULE | Freq: Three times a day (TID) | ORAL | Status: DC | PRN
  Administered 2017-10-15 – 2017-10-18 (×7): 100 mg via ORAL
  Filled 2017-10-15 (×7): qty 1

## 2017-10-15 MED ORDER — NITROGLYCERIN 0.4 MG SL SUBL
SUBLINGUAL_TABLET | SUBLINGUAL | Status: AC
  Administered 2017-10-15: 0.4 mg
  Filled 2017-10-15: qty 2

## 2017-10-15 MED ORDER — ISOSORBIDE MONONITRATE ER 30 MG PO TB24
15.0000 mg | ORAL_TABLET | Freq: Every day | ORAL | Status: DC
  Administered 2017-10-15: 15 mg via ORAL
  Filled 2017-10-15: qty 1

## 2017-10-15 MED ORDER — CARVEDILOL 3.125 MG PO TABS
3.1250 mg | ORAL_TABLET | Freq: Two times a day (BID) | ORAL | Status: DC
  Administered 2017-10-15 – 2017-10-22 (×10): 3.125 mg via ORAL
  Filled 2017-10-15 (×12): qty 1

## 2017-10-15 MED ORDER — HYDRALAZINE HCL 10 MG PO TABS
10.0000 mg | ORAL_TABLET | Freq: Three times a day (TID) | ORAL | Status: DC
  Administered 2017-10-15 – 2017-10-17 (×2): 10 mg via ORAL
  Filled 2017-10-15 (×8): qty 1

## 2017-10-15 NOTE — Progress Notes (Signed)
[place] for heparin Indication: atrial fibrillation  Allergies  Allergen Reactions  . Aspirin Hives and Other (See Comments)    Pt states that she is unable to take because she had bleeding in her brain.    . Other Other (See Comments)    Pt states that she is unable to take blood thinners because she had bleeding in her brain.  Blood Thinners-per doctor at [facility]  . Penicillins Hives, Shortness Of Breath, Swelling and Other (See Comments)    Has patient had a PCN reaction causing immediate rash, facial/tongue/throat swelling, SOB or lightheadedness with hypotension: Yes Has patient had a PCN reaction causing severe rash involving mucus membranes or skin necrosis: No Has patient had a PCN reaction that required hospitalization No Has patient had a PCN reaction occurring within the last 10 years: No If all of the above answers are "NO", then may proceed with Cephalosporin use.    Patient Measurements: Height: 5\' 1"  (154.9 cm) Weight: 249 lb 6.4 oz (113.1 kg) IBW/kg (Calculated) : 47.8 Heparin Dosing Weight: 74 kg  Vital Signs: Temp: 97.7 F (36.5 C) (04/25 0534) Temp Source: Oral (04/25 0534) BP: 112/70 (04/25 0534) Pulse Rate: 60 (04/25 0534)  Labs: Recent Labs    10/13/17 0524 10/13/17 1317 10/14/17 0336 10/14/17 1029 10/15/17 0336  HGB 10.0*  --  10.1*  --  10.3*  HCT 30.4*  --  31.2*  --  32.1*  PLT 211  --  205  --  202  HEPARINUNFRC 0.71* 0.52 0.56  --  0.61  CREATININE 2.70*  --   --  2.26*  --     Estimated Creatinine Clearance: 25.5 mL/min (A) (by C-G formula based on SCr of 2.26 mg/dL (H)).   Medical History: Past Medical History:  Diagnosis Date  . Arthritis   . Atrial fibrillation, persistent ([place])    a. s/p TEE-guided DCCV on 08/20/2017  . Chest pain    a. 01/2012 Cath: nonobs dzs;  c. 04/2014 MV: No ischemia.  . Chronic back pain   . Chronic combined systolic (congestive) and diastolic (congestive)  heart failure ([place])    a. Dx 2005 @ [facility];  b. 02/2014 Echo: Ef 55-60%, no rwma, mild MR, PASP 36mmHg. c. 07/2017: EF 30-35%, diffuse HK, trivial AI, severe MR, moderate TR  . Chronic respiratory failure ([facility])    a. on home O2  . COPD (chronic obstructive pulmonary disease) ([place])    a. Home O2 - 3lpm  . Gastritis   . History of intracranial hemorrhage    a. 6/14 described by neurosurgery as "small frontal posttraumatic SAH " - pt was on xarelto @ the time.  . Hyperlipidemia   . Hypertension   . Lipoma of back   . Lymphedema    a. Chronic LLE edema.  . Obesity   . Sepsis with metabolic encephalopathy ([place])   . Sleep apnea   . Streptococcal bacteremia   . Stroke ([place])   . Symptomatic bradycardia    a. s/p BSX PPM.  . Thyroid nodule   . Type II diabetes mellitus ([place])   . Urinary incontinence     Medications:  Patient takes apixaban 5mg  BID at home for afib. She was initially started on apixaban 5mg  BID when she was admitted, but her dose was changed to apixaban 2.5mg  BID on 4/14 due to her increase in Scr (1.4 --> 3.1). Cardiology stated that decrease in apixaban dose was for instability of  patient's renal function.   Assessment: Pharmacy consulted for heparin drip management for 72 yo female transitioned from apixaban to heparin secondary to decline in renal function. Patient with past medical history significant for atrial fibrillation and heart failure. Patient currently receiving heparin at 850 units/hr.  Goal of Therapy:  Heparin level 0.3-0.7 units/ml  APTT 66-102s Monitor platelets by anticoagulation protocol: Yes   Plan:  Will continue heparin infusion at 850 units/hr and recheck in AM.   04/24 @ 0300 HL 0.56 therapeutic. Will continue current rate and will recheck HL/CBC w/ am labs.  04/25 @ 0330 HL 0.61 therapeutic. Will continue current rate and will recheck w/ am labs.  Tobie Lords, PharmD, BCPS Clinical Pharmacist 10/15/2017

## 2017-10-15 NOTE — Consult Note (Signed)
Consultation Note Date: 10/15/2017   Patient Name: Debra Saunders  DOB: August 06, 1945  MRN: [id number]  Age / Sex: 72 y.o., female  PCP: [name], MD Referring Physician: Demetrios Loll, MD  Reason for Consultation: Establishing goals of care  HPI/Patient Profile: Debra Saunders  is a 72 y.o. female with a known history per below presenting from her cardiologist's office/Dr. Velva Harman for acute on chronic systolic congestive heart failure exacerbation, weight gain of over 17 pounds, dyspnea on exertion.   Clinical Assessment and Goals of Care: Patient is sitting in bedside chair. She states she lives alone at home. She has children that call to check on her. She has used 4 lpm of O2 at baseline for the past 2 years. She used to enjoy cooking but now has weakness and SOB with walking through the house. She uses a rollator and has to stop and sit midway down the hall. She sits to wash dishes and sits to cook. She only leaves the house to go to doctor's appointments about every 3 months. She states she takes sink bathes and splits her upper and lower body in the cleansing.   We discussed her diagnosis, prognosis, GOC, EOL wishes disposition and options.  She states she understands her current status. She tells me she is ready when the [facility] is ready to take her,  but would like to do everything she can to live as long as possible, and wants to live as long as she can. We discussed the future and her diagnoses. She is unable to think of a situation or scenario that she would want to shift to a comfort focus. Offered a family meeting; she tells me she has a POA and will have conversations with her children at a future time.       SUMMARY OF RECOMMENDATIONS    Continue care. Full code.   Recommend outpatient palliative.        Primary Diagnoses: Present on Admission: . Atrial fibrillation ([place])   I have  reviewed the medical record, interviewed the patient and family, and examined the patient. The following aspects are pertinent.  Past Medical History:  Diagnosis Date  . Arthritis   . Atrial fibrillation, persistent ([place])    a. s/p TEE-guided DCCV on 08/20/2017  . Chest pain    a. 01/2012 Cath: nonobs dzs;  c. 04/2014 MV: No ischemia.  . Chronic back pain   . Chronic combined systolic (congestive) and diastolic (congestive) heart failure ([place])    a. Dx 2005 @ [facility];  b. 02/2014 Echo: Ef 55-60%, no rwma, mild MR, PASP 38mmHg. c. 07/2017: EF 30-35%, diffuse HK, trivial AI, severe MR, moderate TR  . Chronic respiratory failure ([facility])    a. on home O2  . COPD (chronic obstructive pulmonary disease) ([place])    a. Home O2 - 3lpm  . Gastritis   . History of intracranial hemorrhage    a. 6/14 described by neurosurgery as "small frontal posttraumatic SAH " - pt was on xarelto @ the  time.  . Hyperlipidemia   . Hypertension   . Lipoma of back   . Lymphedema    a. Chronic LLE edema.  . Obesity   . Sepsis with metabolic encephalopathy ([place])   . Sleep apnea   . Streptococcal bacteremia   . Stroke ([place])   . Symptomatic bradycardia    a. s/p BSX PPM.  . Thyroid nodule   . Type II diabetes mellitus ([place])   . Urinary incontinence    Social History   Socioeconomic History  . Marital status: Divorced    Spouse name: Not on file  . Number of children: Not on file  . Years of education: Not on file  . Highest education level: Not on file  Occupational History  . Occupation: Retired- factory work    Comment: exposed to [place]  . Financial resource strain: Not on file  . Food insecurity:    Worry: Not on file    Inability: Not on file  . Transportation needs:    Medical: Not on file    Non-medical: Not on file  Tobacco Use  . Smoking status: Former Smoker    Packs/day: 0.30    Years: 2.00    Pack years: 0.60    Types: Cigarettes    Last attempt to quit: 06/23/1990     Years since quitting: 27.3  . Smokeless tobacco: Never Used  Substance and Sexual Activity  . Alcohol use: No  . Drug use: No  . Sexual activity: Not on file  Lifestyle  . Physical activity:    Days per week: Not on file    Minutes per session: Not on file  . Stress: Not on file  Relationships  . Social connections:    Talks on phone: Not on file    Gets together: Not on file    Attends religious service: Not on file    Active member of club or organization: Not on file    Attends meetings of clubs or organizations: Not on file    Relationship status: Not on file  Other Topics Concern  . Not on file  Social History Narrative   Lives in [place] with daughter. Has 4 children. No pets.      Work - Restaurant manager, fast food, retired      Diet - regular   Family History  Problem Relation Age of Onset  . Heart disease Mother   . Hypertension Mother   . COPD Mother        was a smoker  . Thyroid disease Mother   . Hypertension Father   . Hypertension Sister   . Thyroid disease Sister   . Hypertension Sister   . Thyroid disease Sister   . Breast cancer Maternal Aunt   . Kidney disease Neg Hx   . Bladder Cancer Neg Hx    Scheduled Meds: . amiodarone  200 mg Oral BID  . budesonide (PULMICORT) nebulizer solution  0.5 mg Nebulization BID  . carvedilol  3.125 mg Oral BID WC  . chlorhexidine  15 mL Mouth Rinse BID  . clopidogrel  75 mg Oral Daily  . gabapentin  300 mg Oral QHS  . insulin aspart  0-15 Units Subcutaneous TID WC  . insulin aspart  0-5 Units Subcutaneous QHS  . insulin aspart  3 Units Subcutaneous TID WC  . insulin glargine  10 Units Subcutaneous Q24H  . ipratropium-albuterol  3 mL Nebulization TID  . isosorbide mononitrate  15 mg Oral  Daily  . mouth rinse  15 mL Mouth Rinse q12n4p  . methylPREDNISolone (SOLU-MEDROL) injection  40 mg Intravenous Q12H  . multivitamin with minerals  1 tablet Oral Daily  . mupirocin ointment   Nasal BID  . rosuvastatin  10 mg Oral Daily   . sildenafil  20 mg Oral TID  . sodium chloride flush  3 mL Intravenous Q12H  . [START ON 10/16/2017] torsemide  20 mg Oral Daily   Continuous Infusions: . sodium chloride Stopped (10/12/17 0830)  . heparin 850 Units/hr (10/14/17 0824)   PRN Meds:.sodium chloride, acetaminophen, ALPRAZolam, benzonatate, ipratropium-albuterol, menthol-cetylpyridinium, ondansetron (ZOFRAN) IV, ondansetron, promethazine, sodium chloride flush Medications Prior to Admission:  Prior to Admission medications   Medication Sig Start Date End Date Taking? Authorizing Provider  acetaminophen (TYLENOL) 325 MG tablet Take 325-650 mg by mouth every 6 (six) hours as needed for mild pain. Reported on 10/10/2015   Yes [provider]  albuterol (PROVENTIL) (2.5 MG/3ML) 0.083% nebulizer solution Take 3 mLs (2.5 mg total) by nebulization every 6 (six) hours. 08/28/17  Yes Wieting, Richard, MD  apixaban (ELIQUIS) 5 MG TABS tablet Take 1 tablet (5 mg total) by mouth 2 (two) times daily. 09/23/17  Yes [name], MD  Dulaglutide ([name]) 3.50 KX/3.8HW SOPN Inject 0.75 mg into the skin once a week. 12/29/16  Yes [name], MD  gabapentin (NEURONTIN) 100 MG capsule TAKE 2 CAPSULES BY MOUTH THREE TIMES DAILY 08/12/17  Yes [name], MD  glucose blood (TRUE METRIX BLOOD GLUCOSE TEST) test strip Check blood glucose twice daily Dx:E11.8, Z79.4 09/17/17  Yes [name], MD  losartan (COZAAR) 25 MG tablet Take 1 tablet (25 mg total) by mouth daily. 08/29/17  Yes Wieting, Richard, MD  Menthol, Topical Analgesic, (BIOFREEZE EX) Apply 1 application topically as needed (for pain).    Yes [provider]  metFORMIN (GLUCOPHAGE) 500 MG tablet Take 1,000 mg by mouth 2 (two) times daily with a meal.   Yes [provider]  metoprolol (TOPROL-XL) 200 MG 24 hr tablet Take 1 tablet (200 mg total) by mouth daily. Take with or immediately following a meal. 09/23/17  Yes [name], MD    ondansetron (ZOFRAN) 8 MG tablet Take 1 tablet (8 mg total) by mouth every 8 (eight) hours as needed for nausea or vomiting. 09/25/14  Yes Jackolyn Confer, MD  oxyCODONE-acetaminophen (PERCOCET/ROXICET) 5-325 MG tablet Take 1 tablet by mouth every 8 (eight) hours as needed for severe pain. 08/28/17  Yes Wieting, Richard, MD  potassium chloride SA (K-DUR,KLOR-CON) 20 MEQ tablet Take 1 tablet (20 mEq total) by mouth daily. 08/28/17  Yes Wieting, Richard, MD  promethazine (PHENERGAN) 25 MG tablet Take 25 mg by mouth every 4 (four) hours as needed for nausea or vomiting. Reported on 10/10/2015   Yes [provider]  protein supplement shake (PREMIER PROTEIN) LIQD Take 325 mLs (11 oz total) by mouth 2 (two) times daily between meals. 08/28/17  Yes Wieting, Richard, MD  rosuvastatin (CRESTOR) 10 MG tablet Take 1 tablet (10 mg total) by mouth daily. 12/29/16  Yes [name], MD  sildenafil (REVATIO) 20 MG tablet Take 20 mg by mouth 3 (three) times daily. 05/20/17  Yes [provider]  spironolactone (ALDACTONE) 25 MG tablet Take 0.5 tablets (12.5 mg total) by mouth daily. 08/28/17  Yes Wieting, Richard, MD  torsemide (DEMADEX) 20 MG tablet Take 1 tablet (20 mg total) by mouth daily. Patient taking differently: Take 20MG   by mouth and alternate with 40MG  every other day 09/14/17 12/13/17 Yes Strader, Fransisco Hertz, PA-C   Allergies  Allergen Reactions  . Aspirin Hives and Other (See Comments)    Pt states that she is unable to take because she had bleeding in her brain.    . Other Other (See Comments)    Pt states that she is unable to take blood thinners because she had bleeding in her brain.  Blood Thinners-per doctor at [facility]  . Penicillins Hives, Shortness Of Breath, Swelling and Other (See Comments)    Has patient had a PCN reaction causing immediate rash, facial/tongue/throat swelling, SOB or lightheadedness with hypotension: Yes Has patient had a PCN reaction causing severe rash  involving mucus membranes or skin necrosis: No Has patient had a PCN reaction that required hospitalization No Has patient had a PCN reaction occurring within the last 10 years: No If all of the above answers are "NO", then may proceed with Cephalosporin use.   Review of Systems  Constitutional: Positive for fatigue.  Respiratory: Positive for shortness of breath.   Neurological: Positive for weakness.    Physical Exam  Constitutional: No distress.  Pulmonary/Chest: Effort normal.  O2 in place.  Neurological: She is alert.  Oriented.    Vital Signs: BP (!) 123/46 (BP Location: Right Arm)   Pulse 60   Temp (!) 97.5 F (36.4 C) (Oral)   Resp 16   Ht 5\' 1"  (1.549 m)   Wt 113.1 kg (249 lb 6.4 oz)   SpO2 100%   BMI 47.12 kg/m  Pain Scale: 0-10   Pain Score: 0-No pain   SpO2: SpO2: 100 % O2 Device:SpO2: 100 % O2 Flow Rate: .O2 Flow Rate (L/min): 4 L/min  IO: Intake/output summary:   Intake/Output Summary (Last 24 hours) at 10/15/2017 1353 Last data filed at 10/15/2017 1100 Gross per 24 hour  Intake 407.62 ml  Output 2250 ml  Net -1842.38 ml    LBM: Last BM Date: 10/15/17 Baseline Weight: Weight: 106.6 kg (235 lb) Most recent weight: Weight: 113.1 kg (249 lb 6.4 oz)     Palliative Assessment/Data: 40%     Time In: 1:50 Time Out: 2:00 Time Total: 70 min Greater than 50%  of this time was spent counseling and coordinating care related to the above assessment and plan.  Signed by: Asencion Gowda, NP   Please contact Palliative Medicine Team phone at [phone] for questions and concerns.  For individual provider: See Shea Evans

## 2017-10-15 NOTE — Care Management Important Message (Signed)
Important Message  Patient Details  Name: Debra Saunders MRN: [id number] Date of Birth: 02-28-1946   Medicare Important Message Given:  Yes    Beverly Sessions, RN 10/15/2017, 6:30 PM

## 2017-10-15 NOTE — Progress Notes (Signed)
Advanced Care Plan.  Purpose of Encounter: CODE STATUS and palliative care Parties in Attendance: The patient and me. Patient's Decisional Capacity: Yes Medical Story: Debra Saunders  is a 72 y.o. female with multiple medical problems was admitted for acute on chronic respiratory failure due to CHF exacerbation.  She also has acute renal failure on CKD, non-STEMI, COPD exacerbation, PAH, hyperlipidemia, diabetes.  She is on oxygen 4 L, torsemide and heparin drip.  Since the patient has poor prognosis, I discussed with the patient about her current condition, poor prognosis, CODE STATUS and palliative care.  The patient wants full code but agreed to get palliative care consult.  Plan:  Code Status: Full code. Time spent discussing advance care planning: 18 minutes.

## 2017-10-15 NOTE — Progress Notes (Signed)
Progress Note  Patient Name: Debra Saunders Date of Encounter: 10/15/2017  Primary Cardiologist: Fletcher Anon  Subjective   Placed on PO Lasix 20 mg on 4/24 with documented UOP of 1.2 L for the past 24 hours and a net - 12 L for the admission. Weight down 2 pounds over the past 24 hours from 251-->249 pounds. Renal function improved from 2.26-->2.07. Remains on heparin gtt. SOB slightly improved this morning. Rhino-rocket removed, without further epistaxis. No chest pain. Remains on supplemental oxygen via nasal cannula.   Inpatient Medications    Scheduled Meds: . amiodarone  200 mg Oral BID  . budesonide (PULMICORT) nebulizer solution  0.5 mg Nebulization BID  . chlorhexidine  15 mL Mouth Rinse BID  . clopidogrel  75 mg Oral Daily  . furosemide  20 mg Oral Daily  . gabapentin  300 mg Oral QHS  . insulin aspart  0-15 Units Subcutaneous TID WC  . insulin aspart  0-5 Units Subcutaneous QHS  . insulin aspart  3 Units Subcutaneous TID WC  . insulin glargine  10 Units Subcutaneous Q24H  . ipratropium-albuterol  3 mL Nebulization TID  . mouth rinse  15 mL Mouth Rinse q12n4p  . methylPREDNISolone (SOLU-MEDROL) injection  40 mg Intravenous Q12H  . multivitamin with minerals  1 tablet Oral Daily  . mupirocin ointment   Nasal BID  . rosuvastatin  10 mg Oral Daily  . sildenafil  20 mg Oral TID  . sodium chloride flush  3 mL Intravenous Q12H   Continuous Infusions: . sodium chloride Stopped (10/12/17 0830)  . heparin 850 Units/hr (10/14/17 0824)   PRN Meds: sodium chloride, acetaminophen, ALPRAZolam, benzonatate, ipratropium-albuterol, menthol-cetylpyridinium, ondansetron (ZOFRAN) IV, ondansetron, promethazine, sodium chloride flush   Vital Signs    Vitals:   10/14/17 2300 10/15/17 0534 10/15/17 0707 10/15/17 0758  BP:  112/70  (!) 123/46  Pulse:  60  60  Resp:    16  Temp:  97.7 F (36.5 C)  (!) 97.5 F (36.4 C)  TempSrc:  Oral  Oral  SpO2: 98% 99% 100% 100%  Weight:  249 lb  6.4 oz (113.1 kg)    Height:        Intake/Output Summary (Last 24 hours) at 10/15/2017 0908 Last data filed at 10/15/2017 5400 Gross per 24 hour  Intake 407.62 ml  Output 1650 ml  Net -1242.38 ml   Filed Weights   10/13/17 0318 10/14/17 0329 10/15/17 0534  Weight: 247 lb 6.4 oz (112.2 kg) 251 lb (113.9 kg) 249 lb 6.4 oz (113.1 kg)    Telemetry    paced - Personally Reviewed  ECG    n/a - Personally Reviewed  Physical Exam   GEN: No acute distress.   Neck: JVD difficult to assess given body habitus. Cardiac: RRR, no murmurs, rubs, or gallops.  Respiratory: Diminished breath sounds bilaterally.  GI: Soft, nontender, non-distended.   MS: 2+ bilateral lower extremity edema to the thighs; No deformity. Neuro:  Alert and oriented x 3; Nonfocal.  Psych: Normal affect.  Labs    Chemistry Recent Labs  Lab 10/13/17 0524 10/14/17 1029 10/15/17 0336  NA 135 135 TEST WILL BE CREDITED  135  K 4.3 4.6 TEST WILL BE CREDITED  4.7  CL 101 101 TEST WILL BE CREDITED  101  CO2 27 25 TEST WILL BE CREDITED  26  GLUCOSE 235* 233* TEST WILL BE CREDITED  247*  BUN 102* 103* TEST WILL BE CREDITED  99*  CREATININE  2.70* 2.26* TEST WILL BE CREDITED  2.07*  CALCIUM 9.4 9.6 TEST WILL BE CREDITED  9.3  ALBUMIN  --  3.3* 3.2*  GFRNONAA 16* 20* NOT CALCULATED  22*  GFRAA 19* 23* NOT CALCULATED  26*  ANIONGAP 7 9 7  8      Hematology Recent Labs  Lab 10/13/17 0524 10/14/17 0336 10/15/17 0336  WBC 9.6 9.2 9.3  RBC 3.58* 3.63* 3.72*  HGB 10.0* 10.1* 10.3*  HCT 30.4* 31.2* 32.1*  MCV 84.8 86.0 86.2  MCH 27.8 27.8 27.6  MCHC 32.8 32.3 32.0  RDW 16.7* 16.8* 17.5*  PLT 211 205 202    Cardiac Enzymes Recent Labs  Lab 10/09/17 0834 10/09/17 1420  TROPONINI 2.70* 2.35*   No results for input(s): TROPIPOC in the last 168 hours.   BNPNo results for input(s): BNP, PROBNP in the last 168 hours.   DDimer No results for input(s): DDIMER in the last 168 hours.   Radiology      No results found.  Cardiac Studies   TTE 10/13/2017: Study Conclusions  - Left ventricle: The cavity size was normal. There was mild   concentric hypertrophy. Systolic function was mildly to   moderately reduced. The estimated ejection fraction was in the   range of 40% to 45%. Hypokinesis of the anterior, anteroseptal,   and apical myocardium. basal regions best preserved. The study is   not technically sufficient to allow evaluation of LV diastolic   function. - Mitral valve: There was moderate regurgitation. - Left atrium: The atrium was moderately dilated. - Right ventricle: The cavity size was moderately dilated. Wall   thickness was normal. Systolic function was mildly reduced. - Right atrium: The atrium was moderately dilated. - Tricuspid valve: There was moderate regurgitation. - Pulmonary arteries: Systolic pressure was severely elevated. PA   peak pressure: 65 mm Hg (S).  Patient Profile     72 y.o. female with history of nonobstructive CAD, chronic combined systolic and diastolic congestive heart failure, persistent atrial fibrilation, symptomatic bradycardia status post permanent pacemaker, frequent PVCs, chronic respiratory failure with hypoxia on home O2, COPD, hypertension, hyperlipidemia, obesity, type 2 diabetes mellitus, traumatic subarachnoid hemorrhage in the setting of Xarelto, left lower extremity DVT status post IVC filter, chronic right sided atypical chest pain, chronic left lower extremity lymphedema, prior stroke, sleep apnea, and obesity, who is being seen for the evaluation ofacute on chronic combined heart failure in the setting of persistent atrial fibrillation.  Assessment & Plan    1. Acute renal failure due to ATN:    -Overall, this is a difficult situation with worsening renal dysfunction in the setting of markedly elevated right heart pressures, noted on echo as above, despite holding diuresis -Restarted on PO Lasix 20 mg daily on 4/24 -Dr.  Rockey Situ discussed with Dr. Juleen China and we will change the patient to torsemide 20 mg daily -Improving, continue to monitor -Renal on board -[facility] for prn IV Lasix -Needs treatment of pulmonary hypertension if possible (consult pulmonary or consider transfer to [facility], Dr. Haroldine Laws)  2.  Acute on chronic combined systolic and diastolic CHF:  -Difficult situation given underlying pulmonary hypertension as above -Limited benefit in aggressive diuresis given markedly elevated right heart pressures -ACE and ARB on hold for renal failure -Consider addition of Imdur/hydralazine   3.  Persistent Afib: -Status post DCCV this admission, 10/12/2017 -Continues to hold sinus rhythm -Continue amiodarone oral dosing BID, would decrease to 200 mg daily on 4/26 -Remains on  heparin, consider starting Coumadin bridge, or start DOAC given improvement in renal function  4.  Moderate to severe mitral regurgitation:   -Mitral valve regurgitation much improved on repeat echo and normal sinus rhythm -Follow up as an outpatient   5.  PAH:   -Continue sildenafil   -Diuresis has been limited by renal failure -Needs pulmonary to weigh in on whether pulmonary hypertension medications can be used -Pulmonary hypertension started 2004 or before based on right heart pressures in 2004 -If not a candidate for PAH medications, would consider dialysis and if no benefit/improvement with dialysis, would likely need to consider palliative care    For questions or updates, please contact [place] Please consult [email or web address] for contact info under Cardiology/STEMI.    Signed, Christell Faith, PA-C [place] Pager: [phone] 10/15/2017, 9:08 AM

## 2017-10-15 NOTE — Care Management (Signed)
Patient will need lasix protocol at discharge. Patient is open with [facility].  Jason with [facility].  MD will need to signed prior to discharge.

## 2017-10-15 NOTE — Progress Notes (Signed)
[facility]  ROUNDING NOTE   Subjective:   Epistaxis resolved.   Started on PO furosemide yesterday. UOP 1650.   Patient states she is feeling better.   Objective:  Vital signs in last 24 hours:  Temp:  [97.5 F (36.4 C)-97.9 F (36.6 C)] 97.5 F (36.4 C) (04/25 0758) Pulse Rate:  [60-61] 60 (04/25 0758) Resp:  [16-20] 16 (04/25 0758) BP: (112-139)/(46-77) 123/46 (04/25 0758) SpO2:  [98 %-100 %] 100 % (04/25 0758) FiO2 (%):  [35 %-36 %] 36 % (04/25 0707) Weight:  [113.1 kg (249 lb 6.4 oz)] 113.1 kg (249 lb 6.4 oz) (04/25 0534)  Weight change: -0.726 kg (-1 lb 9.6 oz) Filed Weights   10/13/17 0318 10/14/17 0329 10/15/17 0534  Weight: 112.2 kg (247 lb 6.4 oz) 113.9 kg (251 lb) 113.1 kg (249 lb 6.4 oz)    Intake/Output: I/O last 3 completed shifts: In: 407.6 [I.V.:407.6] Out: 2550 [Urine:2550]   Intake/Output this shift:  No intake/output data recorded.  Physical Exam: General:  No acute distress  Head: Normocephalic, atraumatic.   Eyes: Anicteric  Neck: Supple, trachea midline  Lungs:  Scattered rhonchi, normal effort  Heart: S1S2 no rubs  Abdomen:  Soft, nontender, bowel sounds present  Extremities: trace peripheral edema.  Neurologic: Awake, alert, following commands  Skin: No lesions       Basic Metabolic Panel: Recent Labs  Lab 10/09/17 0411 10/10/17 0359 10/11/17 0423 10/13/17 0524 10/14/17 1029 10/15/17 0336  NA  --  134* 134* 135 135 TEST WILL BE CREDITED  135  K  --  4.5 4.0 4.3 4.6 TEST WILL BE CREDITED  4.7  CL  --  99* 99* 101 101 TEST WILL BE CREDITED  101  CO2  --  29 27 27 25  TEST WILL BE CREDITED  26  GLUCOSE  --  216* 206* 235* 233* TEST WILL BE CREDITED  247*  BUN  --  68* 82* 102* 103* TEST WILL BE CREDITED  99*  CREATININE  --  2.10* 2.57* 2.70* 2.26* TEST WILL BE CREDITED  2.07*  CALCIUM  --  9.3 9.1 9.4 9.6 TEST WILL BE CREDITED  9.3  MG 1.8 1.9  --   --   --   --   PHOS 5.2* 5.1*  --   --  5.6* 5.0*     Liver Function Tests: Recent Labs  Lab 10/14/17 1029 10/15/17 0336  ALBUMIN 3.3* 3.2*   No results for input(s): LIPASE, AMYLASE in the last 168 hours. No results for input(s): AMMONIA in the last 168 hours.  CBC: Recent Labs  Lab 10/11/17 0423 10/12/17 0417 10/13/17 0524 10/14/17 0336 10/15/17 0336  WBC 10.1 8.9 9.6 9.2 9.3  HGB 9.8* 9.9* 10.0* 10.1* 10.3*  HCT 30.5* 31.2* 30.4* 31.2* 32.1*  MCV 85.0 85.5 84.8 86.0 86.2  PLT 206 207 211 205 202    Cardiac Enzymes: Recent Labs  Lab 10/09/17 0834 10/09/17 1420  TROPONINI 2.70* 2.35*    BNP: Invalid input(s): POCBNP  CBG: Recent Labs  Lab 10/14/17 0817 10/14/17 1221 10/14/17 1705 10/14/17 2028 10/15/17 0756  GLUCAP 216* 232* 205* 172* 193*    Microbiology: Results for orders placed or performed during the hospital encounter of 09/30/17  MRSA PCR Screening     Status: Abnormal   Collection Time: 10/01/17  9:37 AM  Result Value Ref Range Status   MRSA by PCR POSITIVE (A) NEGATIVE Final    Comment:  [name] (FDA approved for NASAL specimens only), is one component of a comprehensive MRSA colonization surveillance program. It is not intended to diagnose MRSA infection nor to guide or monitor treatment for MRSA infections. RESULT CALLED TO, READ BACK BY AND VERIFIED WITH:  Gildardo Pounds AT 1207 10/01/17 SDR Performed at [facility], [address] [place], [place] [address]     Coagulation Studies: No results for input(s): LABPROT, INR in the last 72 hours.  Urinalysis: No results for input(s): COLORURINE, LABSPEC, PHURINE, GLUCOSEU, HGBUR, BILIRUBINUR, KETONESUR, PROTEINUR, UROBILINOGEN, NITRITE, LEUKOCYTESUR in the last 72 hours.  Invalid input(s): APPERANCEUR    Imaging: No results found.   Medications:   . sodium chloride Stopped (10/12/17 0830)  . heparin 850 Units/hr (10/14/17 0824)   . amiodarone  200 mg Oral BID  . budesonide (PULMICORT)  nebulizer solution  0.5 mg Nebulization BID  . chlorhexidine  15 mL Mouth Rinse BID  . clopidogrel  75 mg Oral Daily  . gabapentin  300 mg Oral QHS  . insulin aspart  0-15 Units Subcutaneous TID WC  . insulin aspart  0-5 Units Subcutaneous QHS  . insulin aspart  3 Units Subcutaneous TID WC  . insulin glargine  10 Units Subcutaneous Q24H  . ipratropium-albuterol  3 mL Nebulization TID  . mouth rinse  15 mL Mouth Rinse q12n4p  . methylPREDNISolone (SOLU-MEDROL) injection  40 mg Intravenous Q12H  . multivitamin with minerals  1 tablet Oral Daily  . mupirocin ointment   Nasal BID  . rosuvastatin  10 mg Oral Daily  . sildenafil  20 mg Oral TID  . sodium chloride flush  3 mL Intravenous Q12H  . [START ON 10/16/2017] torsemide  20 mg Oral Daily   sodium chloride, acetaminophen, ALPRAZolam, benzonatate, ipratropium-albuterol, menthol-cetylpyridinium, ondansetron (ZOFRAN) IV, ondansetron, promethazine, sodium chloride flush  Assessment/ Plan:  72 y.o.black female with a PMHx of chronic diastolic heart failure, atrial fibrillation, chronic hypercarbic respiratory failure, COPD, morbid obesity, hyperlipidemia, diabetes mellitus type 2, history of intracranial hemorrhage who was admitted to [facility] on 09/30/2017   1.  Acute renal failure: Acute renal failure secondary to acute cardiorenal syndrome and overdiuresis  Required two treatment of hemodialysis in the past. No acute indication for dialysis.  Creatinine and BUN improved.  - Continue to monitor urine output.  - Change to torsemide 20mg  PO daily.  - Discussed case with Dr. Rockey Situ, cardiology.   2. Hypertension with atrial fibrillation: status post cardioversion on 4/22.  Acute exacerbation of systolic and diastolic congestive heart failure.  - amiodarone. - sildenafil for pulmonary hypertension.  - as above: torsemide.   3.  Anemia with renal failure: hemoglobin 10.3. No indication for epo  4.  Hyponatremia: chronic, secondary to cardiac  status. 135    LOS: [place] 4/25/201910:40 AM

## 2017-10-15 NOTE — Progress Notes (Signed)
Patient is complaining of chest pain 5/10. Administered sublingual nitro with little relief after 5 minutes. VSS. No changes on the cardiac monitor. Patient stated pain is moving from chest to right breast.  Administered Maalox for possible indigestion.

## 2017-10-15 NOTE — Progress Notes (Addendum)
Patient ID: Debra Saunders, female   DOB: 1946/04/26, 72 y.o.   MRN: [id number]   [employer] PROGRESS NOTE  Debra Saunders QMG:[id number] DOB: 08-08-1945 DOA: 09/30/2017 PCP: [name], MD  HPI/Subjective:  The patient has no complaints except bilateral leg swelling.  On oxygen 4 L by nasal cannula.   Vitals:   10/15/17 0758 10/15/17 1413  BP: (!) 123/46   Pulse: 60   Resp: 16   Temp: (!) 97.5 F (36.4 C)   SpO2: 100% 98%    Filed Weights   10/13/17 0318 10/14/17 0329 10/15/17 0534  Weight: 247 lb 6.4 oz (112.2 kg) 251 lb (113.9 kg) 249 lb 6.4 oz (113.1 kg)     CONSTITUTIONAL: No documented fever. No fatigue, weakness. No weight gain, no weight loss.  EYES: No blurry or double vision.  ENT: No tinnitus. No postnasal drip. No redness of the oropharynx.  RESPIRATORY: No cough, no wheeze, no hemoptysis.  Positive dyspnea.  CARDIOVASCULAR: No chest pain. No orthopnea. No palpitations. No syncope.  GASTROINTESTINAL: No nausea, no vomiting or diarrhea. No abdominal pain. No melena or hematochezia.  GENITOURINARY:  No urgency. No frequency. No dysuria. No hematuria. No obstructive symptoms. No discharge. No pain. No significant abnormal bleeding ENDOCRINE: No polyuria or nocturia. No heat or cold intolerance.  HEMATOLOGY: No anemia. No bruising. No bleeding. No purpura. No petechiae INTEGUMENTARY: No rashes. No lesions.  MUSCULOSKELETAL: No arthritis.  Bilateral leg swelling. NEUROLOGIC: No numbness, tingling, or ataxia. No seizure-type activity.  PSYCHIATRIC: No anxiety. No insomnia. No ADD.     Exam: Physical Exam  Constitutional: She is oriented to person, place, and time. She appears well-developed.  HENT:  Head: Normocephalic.  Nose: No mucosal edema.  Mouth/Throat: Oropharynx is clear and moist. No oropharyngeal exudate.  Eyes: Pupils are equal, round, and reactive to light. Conjunctivae, EOM and lids are normal.  Neck: Normal range of motion. Neck  supple. No JVD present. Carotid bruit is not present. No edema present. No thyroid mass and no thyromegaly present.  Cardiovascular: Regular rhythm, S1 normal and S2 normal. Exam reveals no gallop.  No murmur heard. Pulses:      Dorsalis pedis pulses are 2+ on the right side, and 2+ on the left side.  Respiratory: Effort normal and breath sounds normal. No accessory muscle usage. No respiratory distress. She has no wheezes. She has no rhonchi. She has no rales.  GI: Soft. Bowel sounds are normal. There is no tenderness.  Musculoskeletal: Normal range of motion. She exhibits edema.       Right ankle: She exhibits swelling.       Left ankle: She exhibits swelling.  Lymphadenopathy:    She has no cervical adenopathy.  Neurological: She is alert and oriented to person, place, and time. No cranial nerve deficit.  Skin: Skin is warm. No rash noted. Nails show no clubbing.  Psychiatric: She has a normal mood and affect.      Data Reviewed: Basic Metabolic Panel: Recent Labs  Lab 10/09/17 0411 10/10/17 0359 10/11/17 0423 10/13/17 0524 10/14/17 1029 10/15/17 0336  NA  --  134* 134* 135 135 TEST WILL BE CREDITED  135  K  --  4.5 4.0 4.3 4.6 TEST WILL BE CREDITED  4.7  CL  --  99* 99* 101 101 TEST WILL BE CREDITED  101  CO2  --  29 27 27 25  TEST WILL BE CREDITED  26  GLUCOSE  --  216* 206* 235* 233*  TEST WILL BE CREDITED  247*  BUN  --  68* 82* 102* 103* TEST WILL BE CREDITED  99*  CREATININE  --  2.10* 2.57* 2.70* 2.26* TEST WILL BE CREDITED  2.07*  CALCIUM  --  9.3 9.1 9.4 9.6 TEST WILL BE CREDITED  9.3  MG 1.8 1.9  --   --   --   --   PHOS 5.2* 5.1*  --   --  5.6* 5.0*   Acute kidney injury CBC: Recent Labs  Lab 10/11/17 0423 10/12/17 0417 10/13/17 0524 10/14/17 0336 10/15/17 0336  WBC 10.1 8.9 9.6 9.2 9.3  HGB 9.8* 9.9* 10.0* 10.1* 10.3*  HCT 30.5* 31.2* 30.4* 31.2* 32.1*  MCV 85.0 85.5 84.8 86.0 86.2  PLT 206 207 211 205 202   BNP (last 3 results) Recent Labs     08/04/17 1552 08/16/17 0925 09/30/17 1545  BNP 175.0* 655.0* 1,151.0*     CBG: Recent Labs  Lab 10/14/17 1221 10/14/17 1705 10/14/17 2028 10/15/17 0756 10/15/17 1148  GLUCAP 232* 205* 172* 193* 155*    No results found for this or any previous visit (from the past 240 hour(s)).    Scheduled Meds: . amiodarone  200 mg Oral BID  . budesonide (PULMICORT) nebulizer solution  0.5 mg Nebulization BID  . carvedilol  3.125 mg Oral BID WC  . chlorhexidine  15 mL Mouth Rinse BID  . clopidogrel  75 mg Oral Daily  . gabapentin  300 mg Oral QHS  . insulin aspart  0-15 Units Subcutaneous TID WC  . insulin aspart  0-5 Units Subcutaneous QHS  . insulin aspart  3 Units Subcutaneous TID WC  . insulin glargine  10 Units Subcutaneous Q24H  . ipratropium-albuterol  3 mL Nebulization TID  . isosorbide mononitrate  15 mg Oral Daily  . mouth rinse  15 mL Mouth Rinse q12n4p  . multivitamin with minerals  1 tablet Oral Daily  . mupirocin ointment   Nasal BID  . rosuvastatin  10 mg Oral Daily  . sildenafil  20 mg Oral TID  . sodium chloride flush  3 mL Intravenous Q12H  . [START ON 10/16/2017] torsemide  20 mg Oral Daily   Continuous Infusions: . sodium chloride Stopped (10/12/17 0830)  . heparin 850 Units/hr (10/15/17 1458)    Assessment/Plan:  1. Acute on chronic respiratory failure in a setting of acute on chronic diastolic CHF with flash pulmonary edema , she was on low-dose Lasix, torsemide 20 mg p.o. daily now.  Continue home oxygen 4 L. 2. Acute kidney injury on CKD stage 3.    Improving, hopefully no need for hemodialysis. 3. NSTEMI/Cardiomyopathy EF 30-35%,  continue IV heparin and may transition to  Eliquis once decided if dialysis is needed or not.  Troponin was elevated at 2.7 on April 19.  No cardiac cath due to renal failure.  Per Dr. Rockey Situ, consider cardiac cath when ready from renal perspective. 4.  5. acute on chronic diastolic congestive heart failure.  Patient has  moderate to severe mitral valve regurgitation.     She was n low-dose Lasix and change to torsemide 20 mg p.o. daily. Chronic atrial fibrillation status post cardioversion.  Continue amiodarone.  Would continue anticoagulation 6. Nosebleed stopped we could restart Eliquis per Dr. Rockey Situ. 7. chronic hypoxic hypercarbic respiratory failure.  Chronically on 4 L of oxygen. 8. Morbid obesity.  Weight loss needed 9. COPD exacerbation, improved obese IV steroids and nebulizer treatments.    Discontinue IV steroids.  10. Hyperlipidemia unspecified on Crestor 11. Type 2 diabetes mellitus on sliding scale insulin,. 12. Upper respiratory tract infection  status post treatment with oral antibiotic 13. Hyperlipidemia unspecified on Crestor 14. PAH. Cont sildenafil.  Pulmonary consult. Requested Palliative care consult due to multiple medical problems and poor prognosis. Code Status:     Code Status Orders  (From admission, onward)        Start     Ordered   09/30/17 1901  Full code  Continuous     09/30/17 1901    Code Status History    Date Active Date Inactive Code Status Order ID Comments User Context   08/16/2017 3500 08/28/2017 2239 Full Code [id number]  Gorden Harms, MD Inpatient   03/30/2015 2152 04/03/2015 2122 Full Code [id number]  Etta Quill, DO Inpatient   03/27/2015 2208 03/28/2015 1737 Full Code [id number]  Nicholes Mango, MD ED   01/09/2014 1418 01/10/2014 1803 Full Code [id number]  Deboraha Sprang, MD Inpatient   01/16/2013 2335 01/27/2013 2011 Full Code [id number]  Collene Gobble, MD Inpatient     Disposition Plan:  TBD  Consultants:  Cardiology  Time spent: 36 minutes, case discussed with the patient, RN and CM. Demetrios Loll  [employer]

## 2017-10-16 ENCOUNTER — Inpatient Hospital Stay: Payer: Medicare HMO

## 2017-10-16 DIAGNOSIS — J811 Chronic pulmonary edema: Secondary | ICD-10-CM

## 2017-10-16 DIAGNOSIS — R600 Localized edema: Secondary | ICD-10-CM

## 2017-10-16 LAB — BASIC METABOLIC PANEL
Anion gap: 5 (ref 5–15)
BUN: 94 mg/dL — ABNORMAL HIGH (ref 6–20)
CO2: 29 mmol/L (ref 22–32)
Calcium: 9 mg/dL (ref 8.9–10.3)
Chloride: 103 mmol/L (ref 101–111)
Creatinine, Ser: 1.7 mg/dL — ABNORMAL HIGH (ref 0.44–1.00)
GFR calc Af Amer: 33 mL/min — ABNORMAL LOW (ref >60)
GFR calc non Af Amer: 28 mL/min — ABNORMAL LOW (ref >60)
Glucose, Bld: 148 mg/dL — ABNORMAL HIGH (ref 65–99)
Potassium: 4.2 mmol/L (ref 3.5–5.1)
Sodium: 137 mmol/L (ref 135–145)

## 2017-10-16 LAB — GLUCOSE, CAPILLARY
Glucose-Capillary: 138 mg/dL — ABNORMAL HIGH (ref 65–99)
Glucose-Capillary: 65 mg/dL (ref 65–99)
Glucose-Capillary: 89 mg/dL (ref 65–99)
Glucose-Capillary: 133 mg/dL — ABNORMAL HIGH (ref 65–99)
Glucose-Capillary: 122 mg/dL — ABNORMAL HIGH (ref 65–99)

## 2017-10-16 LAB — CBC
HCT: 29.9 % — ABNORMAL LOW (ref 35.0–47.0)
Hemoglobin: 9.7 g/dL — ABNORMAL LOW (ref 12.0–16.0)
MCH: 27.9 pg (ref 26.0–34.0)
MCHC: 32.5 g/dL (ref 32.0–36.0)
MCV: 86 fL (ref 80.0–100.0)
Platelets: 167 10*3/uL (ref 150–440)
RBC: 3.47 MIL/uL — ABNORMAL LOW (ref 3.80–5.20)
RDW: 17.3 % — ABNORMAL HIGH (ref 11.5–14.5)
WBC: 8.9 10*3/uL (ref 3.6–11.0)

## 2017-10-16 LAB — HEPARIN LEVEL (UNFRACTIONATED): Heparin Unfractionated: 0.54 IU/mL (ref 0.30–0.70)

## 2017-10-16 MED ORDER — OXYCODONE-ACETAMINOPHEN 5-325 MG PO TABS
1.0000 | ORAL_TABLET | Freq: Four times a day (QID) | ORAL | Status: DC | PRN
  Filled 2017-10-16: qty 2

## 2017-10-16 NOTE — Progress Notes (Signed)
Progress Note  Patient Name: Debra Saunders Date of Encounter: 10/16/2017  Primary Cardiologist: Fletcher Anon  Subjective   Reports that she had chest pain last night Symptoms better after Maalox and sublingual nitro She did get out to a chair yesterday,  Continued shortness of breath  Inpatient Medications    Scheduled Meds: . amiodarone  200 mg Oral BID  . budesonide (PULMICORT) nebulizer solution  0.5 mg Nebulization BID  . carvedilol  3.125 mg Oral BID WC  . chlorhexidine  15 mL Mouth Rinse BID  . clopidogrel  75 mg Oral Daily  . gabapentin  300 mg Oral QHS  . hydrALAZINE  10 mg Oral TID  . insulin aspart  0-15 Units Subcutaneous TID WC  . insulin aspart  0-5 Units Subcutaneous QHS  . insulin aspart  3 Units Subcutaneous TID WC  . insulin glargine  10 Units Subcutaneous Q24H  . ipratropium-albuterol  3 mL Nebulization TID  . mouth rinse  15 mL Mouth Rinse q12n4p  . multivitamin with minerals  1 tablet Oral Daily  . mupirocin ointment   Nasal BID  . pantoprazole  40 mg Oral Daily  . rosuvastatin  10 mg Oral Daily  . sildenafil  20 mg Oral TID  . sodium chloride flush  3 mL Intravenous Q12H  . torsemide  20 mg Oral Daily   Continuous Infusions: . sodium chloride Stopped (10/12/17 0830)  . heparin 850 Units/hr (10/15/17 1458)   PRN Meds: sodium chloride, acetaminophen, ALPRAZolam, alum & mag hydroxide-simeth, benzonatate, ipratropium-albuterol, menthol-cetylpyridinium, ondansetron (ZOFRAN) IV, ondansetron, oxyCODONE-acetaminophen, promethazine, sodium chloride flush   Vital Signs    Vitals:   10/15/17 2202 10/16/17 0422 10/16/17 0725 10/16/17 0738  BP: 112/62 114/77 (!) 97/51   Pulse: 61 63 62   Resp:   18   Temp:  98.1 F (36.7 C)    TempSrc:  Oral    SpO2:  100% 96% 97%  Weight:  250 lb 9.6 oz (113.7 kg)    Height:        Intake/Output Summary (Last 24 hours) at 10/16/2017 0956 Last data filed at 10/16/2017 0700 Gross per 24 hour  Intake 102 ml  Output  1300 ml  Net -1198 ml   Filed Weights   10/14/17 0329 10/15/17 0534 10/16/17 0422  Weight: 251 lb (113.9 kg) 249 lb 6.4 oz (113.1 kg) 250 lb 9.6 oz (113.7 kg)    Telemetry    paced - Personally Reviewed  ECG    n/a - Personally Reviewed  Physical Exam   Constitutional:  oriented to person, place, and time. No distress. Mild shortness of breath with talking,  on nasal cannula oxygen HENT:  Head: Normocephalic and atraumatic.  Eyes:  no discharge. No scleral icterus.  Neck: Normal range of motion. Neck supple. No JVD present.  Cardiovascular: Normal rate, regular rhythm, normal heart sounds and intact distal pulses. Exam reveals no gallop and no friction rub.  1+ lower extremity edema No murmur heard. Pulmonary/Chest: Effort normal and breath sounds normal. No stridor. No respiratory distress.  no wheezes.  Rales at the bases,  no tenderness.  Abdominal: Soft.  no distension.  no tenderness.  Musculoskeletal: Normal range of motion.  no  tenderness or deformity.  Neurological:  normal muscle tone. Coordination normal. No atrophy Skin: Skin is warm and dry. No rash noted. not diaphoretic.  Psychiatric:  normal mood and affect. behavior is normal. Thought content normal.      Labs  Chemistry Recent Labs  Lab 10/14/17 1029 10/15/17 0336 10/16/17 0413  NA 135 TEST WILL BE CREDITED  135 137  K 4.6 TEST WILL BE CREDITED  4.7 4.2  CL 101 TEST WILL BE CREDITED  101 103  CO2 25 TEST WILL BE CREDITED  26 29  GLUCOSE 233* TEST WILL BE CREDITED  247* 148*  BUN 103* TEST WILL BE CREDITED  99* 94*  CREATININE 2.26* TEST WILL BE CREDITED  2.07* 1.70*  CALCIUM 9.6 TEST WILL BE CREDITED  9.3 9.0  ALBUMIN 3.3* 3.2*  --   GFRNONAA 20* NOT CALCULATED  22* 28*  GFRAA 23* NOT CALCULATED  26* 33*  ANIONGAP 9 TEST WILL BE CREDITED  8 5     Hematology Recent Labs  Lab 10/14/17 0336 10/15/17 0336 10/16/17 0412  WBC 9.2 9.3 8.9  RBC 3.63* 3.72* 3.47*  HGB 10.1* 10.3*  9.7*  HCT 31.2* 32.1* 29.9*  MCV 86.0 86.2 86.0  MCH 27.8 27.6 27.9  MCHC 32.3 32.0 32.5  RDW 16.8* 17.5* 17.3*  PLT 205 202 167    Cardiac Enzymes Recent Labs  Lab 10/09/17 1420  TROPONINI 2.35*   No results for input(s): TROPIPOC in the last 168 hours.   BNPNo results for input(s): BNP, PROBNP in the last 168 hours.   DDimer No results for input(s): DDIMER in the last 168 hours.   Radiology    No results found.  Cardiac Studies   TTE 10/13/2017: Study Conclusions  - Left ventricle: The cavity size was normal. There was mild   concentric hypertrophy. Systolic function was mildly to   moderately reduced. The estimated ejection fraction was in the   range of 40% to 45%. Hypokinesis of the anterior, anteroseptal,   and apical myocardium. basal regions best preserved. The study is   not technically sufficient to allow evaluation of LV diastolic   function. - Mitral valve: There was moderate regurgitation. - Left atrium: The atrium was moderately dilated. - Right ventricle: The cavity size was moderately dilated. Wall   thickness was normal. Systolic function was mildly reduced. - Right atrium: The atrium was moderately dilated. - Tricuspid valve: There was moderate regurgitation. - Pulmonary arteries: Systolic pressure was severely elevated. PA   peak pressure: 65 mm Hg (S).  Patient Profile     72 y.o. female with history of nonobstructive CAD, chronic combined systolic and diastolic congestive heart failure, persistent atrial fibrilation, symptomatic bradycardia status post permanent pacemaker, frequent PVCs, chronic respiratory failure with hypoxia on home O2, COPD, hypertension, hyperlipidemia, obesity, type 2 diabetes mellitus, traumatic subarachnoid hemorrhage in the setting of Xarelto, left lower extremity DVT status post IVC filter, chronic right sided atypical chest pain, chronic left lower extremity lymphedema, prior stroke, sleep apnea, and obesity, who is being  seen for the evaluation ofacute on chronic combined heart failure in the setting of persistent atrial fibrillation.  Assessment & Plan   1. Acute renal failure  Likely component of ATN  improving renal function, would continue gentle diuresis If renal function continues to improve and stable early next week would consider cardiac catheterization possibly Monday  2. Acute on chronic combined systolic and diastolic CHF:  New Cardiomyopathy concerning for ischemic cardiomyopathy Anterior wall hypokinesis concerning for LAD disease Acute respiratory distress episode April 19 with troponin at that time 2.7 ACE and arb on hold for renal failure  low-dose Coreg and hydralazine started but limited by hypotension, hold parameters placed  3. Persistent AF: Cardioversion this admission  normal sinus rhythm restored  Continue amiodarone oral dosing BID  low-dose carvedilol if blood pressure tolerates Currently on heparin infusion in preparation for cardiac catheterization once renal function improves Nosebleed 2 nights ago now better  4. Mitral valve disease Previously noted to be moderate to severe in the setting of atrial fibrillation Mild mitral valve regurgitation  on repeat echo and normal sinus rhythm  5. PAH:  Cont sildenafil.  Diuresis limited by renal failure Long-standing history of high pressures back to 2004, right heart catheterization at that time at [name] Narrow window between renal dysfunction and shortness of breath  6) NSTEMI/Cardiomyopathy recent echocardiogram ejection fraction down from 50 to 55% now 30-35  troponin elevated 2.7 on April 19 in the setting of acute respiratory distress Not taken to the cardiac cath lab right away secondary to renal failure Troponin trended down at the time (2.7 to 2.35) Consider cardiac catheterization possibly Monday if renal function continues to improve  nitroglycerin for any further chest pain   Total  encounter time more than 25 minutes  Greater than 50% was spent in counseling and coordination of care with the patient   For questions or updates, please contact [facility] Please consult [email or web address] for contact info under Cardiology/STEMI.    Signed, Signed, Esmond Plants, MD, Ph.D [facility]

## 2017-10-16 NOTE — Progress Notes (Signed)
[facility]  ROUNDING NOTE   Subjective:   Hypotensive this morning. Holding BP meds and torsemide.   Cardiac catheterization for Monday   Chest pain overnight.   Objective:  Vital signs in last 24 hours:  Temp:  [98.1 F (36.7 C)-98.6 F (37 C)] 98.1 F (36.7 C) (04/26 0422) Pulse Rate:  [57-68] 59 (04/26 1011) Resp:  [18] 18 (04/26 1011) BP: (95-129)/(51-77) 95/56 (04/26 1011) SpO2:  [96 %-100 %] 99 % (04/26 1011) Weight:  [113.7 kg (250 lb 9.6 oz)] 113.7 kg (250 lb 9.6 oz) (04/26 0422)  Weight change: 0.544 kg (1 lb 3.2 oz) Filed Weights   10/14/17 0329 10/15/17 0534 10/16/17 0422  Weight: 113.9 kg (251 lb) 113.1 kg (249 lb 6.4 oz) 113.7 kg (250 lb 9.6 oz)    Intake/Output: I/O last 3 completed shifts: In: 509.6 [I.V.:509.6] Out: 2100 [Urine:2100]   Intake/Output this shift:  No intake/output data recorded.  Physical Exam: General:  No acute distress  Head: Normocephalic, atraumatic.   Eyes: Anicteric  Neck: Supple, trachea midline  Lungs:  Scattered rhonchi, normal effort  Heart: S1S2 no rubs  Abdomen:  Soft, nontender, bowel sounds present  Extremities: 1+peripheral edema. Left > right  Neurologic: Awake, alert, following commands  Skin: No lesions       Basic Metabolic Panel: Recent Labs  Lab 10/10/17 0359 10/11/17 0423 10/13/17 0524 10/14/17 1029 10/15/17 0336 10/16/17 0413  NA 134* 134* 135 135 TEST WILL BE CREDITED  135 137  K 4.5 4.0 4.3 4.6 TEST WILL BE CREDITED  4.7 4.2  CL 99* 99* 101 101 TEST WILL BE CREDITED  101 103  CO2 29 27 27 25  TEST WILL BE CREDITED  26 29  GLUCOSE 216* 206* 235* 233* TEST WILL BE CREDITED  247* 148*  BUN 68* 82* 102* 103* TEST WILL BE CREDITED  99* 94*  CREATININE 2.10* 2.57* 2.70* 2.26* TEST WILL BE CREDITED  2.07* 1.70*  CALCIUM 9.3 9.1 9.4 9.6 TEST WILL BE CREDITED  9.3 9.0  MG 1.9  --   --   --   --   --   PHOS 5.1*  --   --  5.6* 5.0*  --     Liver Function Tests: Recent Labs   Lab 10/14/17 1029 10/15/17 0336  ALBUMIN 3.3* 3.2*   No results for input(s): LIPASE, AMYLASE in the last 168 hours. No results for input(s): AMMONIA in the last 168 hours.  CBC: Recent Labs  Lab 10/12/17 0417 10/13/17 0524 10/14/17 0336 10/15/17 0336 10/16/17 0412  WBC 8.9 9.6 9.2 9.3 8.9  HGB 9.9* 10.0* 10.1* 10.3* 9.7*  HCT 31.2* 30.4* 31.2* 32.1* 29.9*  MCV 85.5 84.8 86.0 86.2 86.0  PLT 207 211 205 202 167    Cardiac Enzymes: Recent Labs  Lab 10/09/17 1420  TROPONINI 2.35*    BNP: Invalid input(s): POCBNP  CBG: Recent Labs  Lab 10/15/17 2037 10/15/17 2148 10/16/17 0741 10/16/17 1146 10/16/17 1209  GLUCAP 173* 161* 138* 65 26    Microbiology: Results for orders placed or performed during the hospital encounter of 09/30/17  MRSA PCR Screening     Status: Abnormal   Collection Time: 10/01/17  9:37 AM  Result Value Ref Range Status   MRSA by PCR POSITIVE (A) NEGATIVE Final    Comment:        [name] (FDA approved for NASAL specimens only), is one component of a comprehensive MRSA colonization surveillance program. It is not  intended to diagnose MRSA infection nor to guide or monitor treatment for MRSA infections. RESULT CALLED TO, READ BACK BY AND VERIFIED WITH:  Gildardo Pounds AT 1207 10/01/17 SDR Performed at [facility], [address] [place], [place] [address]     Coagulation Studies: No results for input(s): LABPROT, INR in the last 72 hours.  Urinalysis: No results for input(s): COLORURINE, LABSPEC, PHURINE, GLUCOSEU, HGBUR, BILIRUBINUR, KETONESUR, PROTEINUR, UROBILINOGEN, NITRITE, LEUKOCYTESUR in the last 72 hours.  Invalid input(s): APPERANCEUR    Imaging: US Venous Img Lower Bilateral  Result Date: 10/16/2017 CLINICAL DATA:  72 year old female with a history of bilateral lower extremity edema and a prior left lower extremity DVT EXAM: BILATERAL LOWER EXTREMITY VENOUS DOPPLER ULTRASOUND TECHNIQUE:  Gray-scale sonography with graded compression, as well as color Doppler and duplex ultrasound were performed to evaluate the lower extremity deep venous systems from the level of the common femoral vein and including the common femoral, femoral, profunda femoral, popliteal and calf veins including the posterior tibial, peroneal and gastrocnemius veins when visible. The superficial great saphenous vein was also interrogated. Spectral Doppler was utilized to evaluate flow at rest and with distal augmentation maneuvers in the common femoral, femoral and popliteal veins. COMPARISON:  04/05/2014, 08/16/2013, 10/04/2012 FINDINGS: RIGHT LOWER EXTREMITY Common Femoral Vein: Common femoral vein patent with respiratory phasicity maintained and flow maintained. Saphenofemoral Junction: No evidence of thrombus. Normal compressibility and flow on color Doppler imaging. Profunda Femoral Vein: No evidence of thrombus. Normal compressibility and flow on color Doppler imaging. Femoral Vein: No evidence of thrombus. Normal compressibility, respiratory phasicity and response to augmentation. Popliteal Vein: No evidence of thrombus. Normal compressibility, respiratory phasicity and response to augmentation. Calf Veins: Peroneal vein patent with flow maintained and no thrombus. Occlusion of the right posterior tibial veins, of indeterminate chronicity. Superficial Great Saphenous Vein: No evidence of thrombus. Normal compressibility and flow on color Doppler imaging. Other Findings:  None. LEFT LOWER EXTREMITY Common Femoral Vein: Patent common femoral vein with flow maintained and respiratory phasicity maintained. Compressible vein. Saphenofemoral Junction: No evidence of thrombus. Normal compressibility and flow on color Doppler imaging. Profunda Femoral Vein: No evidence of thrombus. Normal compressibility and flow on color Doppler imaging. Femoral Vein: No evidence of thrombus. Normal compressibility, respiratory phasicity and  response to augmentation. Popliteal Vein: No evidence of thrombus. Normal compressibility, respiratory phasicity and response to augmentation. Calf Veins: Peroneal vein patent with flow maintained and compressible. One of the paired posterior tibial veins is occluded, indeterminate chronicity though was occluded on comparison studies of 2015 Superficial Great Saphenous Vein: No evidence of thrombus. Normal compressibility and flow on color Doppler imaging. Other Findings:  None. IMPRESSION: Negative sonographic survey for DVT of the bilateral common femoral vein, femoral vein, popliteal veins. The right-sided posterior tibial veins (calf) are occluded, indeterminate chronicity, and 1 of the paired left posterior tibial veins (calf) is occluded, likely chronic. Signed, Dulcy Fanny. Earleen Newport, DO [facility] Radiology Electronically Signed   By: Corrie Mckusick D.O.   On: 10/16/2017 12:07     Medications:   . sodium chloride Stopped (10/12/17 0830)  . heparin 850 Units/hr (10/15/17 1458)   . amiodarone  200 mg Oral BID  . budesonide (PULMICORT) nebulizer solution  0.5 mg Nebulization BID  . carvedilol  3.125 mg Oral BID WC  . chlorhexidine  15 mL Mouth Rinse BID  . clopidogrel  75 mg Oral Daily  . gabapentin  300 mg Oral QHS  . hydrALAZINE  10 mg Oral TID  . insulin aspart  0-15 Units Subcutaneous TID WC  . insulin aspart  0-5 Units Subcutaneous QHS  . ipratropium-albuterol  3 mL Nebulization TID  . mouth rinse  15 mL Mouth Rinse q12n4p  . multivitamin with minerals  1 tablet Oral Daily  . mupirocin ointment   Nasal BID  . pantoprazole  40 mg Oral Daily  . rosuvastatin  10 mg Oral Daily  . sildenafil  20 mg Oral TID  . sodium chloride flush  3 mL Intravenous Q12H  . torsemide  20 mg Oral Daily   sodium chloride, acetaminophen, ALPRAZolam, alum & mag hydroxide-simeth, benzonatate, ipratropium-albuterol, menthol-cetylpyridinium, ondansetron (ZOFRAN) IV,  ondansetron, oxyCODONE-acetaminophen, promethazine, sodium chloride flush  Assessment/ Plan:  72 y.o.black female with a PMHx of chronic diastolic heart failure, atrial fibrillation, chronic hypercarbic respiratory failure, COPD, morbid obesity, hyperlipidemia, diabetes mellitus type 2, history of intracranial hemorrhage who was admitted to [facility] on 09/30/2017   1.  Acute renal failure: Acute renal failure secondary to acute cardiorenal syndrome and overdiuresis  Required two treatment of hemodialysis in the past. No acute indication for dialysis.  Creatinine and BUN improved.  - Continue to monitor urine output.  - Holding torsemide 20mg  PO daily.   2. Hypertension with atrial fibrillation: status post cardioversion on 4/22. Hypotensive this morning. Holding home medications.  Acute exacerbation of systolic and diastolic congestive heart failure.  - amiodarone. - sildenafil for pulmonary hypertension.  - as above: torsemide on hold - Dopplers negative for DVT. History DVT in the past.   3.  Anemia with renal failure: hemoglobin 9.7. No indication for epo  4.  Hyponatremia: chronic, secondary to cardiac status. 137 - improved.     LOS: Debra Saunders 4/26/201912:28 PM

## 2017-10-16 NOTE — Progress Notes (Signed)
72 year old female with PMHx of chronic diastolic heart failure, atrial fibrillation, chronic hypercarbic respiratory failure, COPD, morbid obesity, HLD, DM type 2, hx of intracrainal hemorrhage who was admitted on 09/30/2017:    Problem List: 1. Acute Renal Failure secondary to acute cardiorenal syndrome and over diuresis.  Patent has required two treatments of hemodialysis this admission.   2. Hypertension with A-fib - s/p cardioversion on 10/12/2017.   3. Acute exacerbation of systolic and diastolic CHF - patient on Amiodarone, sildenafil for pulmonary HTN.  Torsemide on hole at this time due to low BP this a.m.   4. Anemia with renal failure - HGB 9.7 5. Chronic hyponatremia - secondary to cardiac status.  Serum sodium currently 137.    Patient for cardiac cath on Monday, October 19, 2017.  Patient had chest pain and continues to have persistent SOB.    Rounded on patient.    CHF education:  Patient has the booklet at bedside, "Living Better with Heart Failure."  Heart failure diagnosis is not a new diagnosis for this patient.  Patient has been followed as an outpatient in the [facility].  ? ? *Reviewed importance of and reason behind checking weight daily in the AM, after using the bathroom, but before getting dressed. Patient has scales.  Patient reports she was weighing herself daily at home prior to admission.   ? *Reviewed with patient the following information: *Discussed when to call the Dr= weight gain of >2-3lb overnight of 5lb in a week,  *Discussed yellow zone= call MD: weight gain of >2-3lb overnight of 5lb in a week, increased swelling, increased SOB when lying down, chest discomfort, dizziness, increased fatigue *Red Zone= call 911: struggle to breath, fainting or near fainting, significant chest pain   *Reviewed low sodium diet-provided handout of recommended and not recommended foods.?  *Discussed fluid intake with patient as well. Patient not currently on a  fluid restriction, but advised no more than 8-8 ounces glass of fluids per day.? ? *Smoking Cessation- Patient is a former smoker.?  *Exercise - Did not discuss exercise at this time, as patient is SOB, on oxygen, on heparin gtt, scheduled for cardiac cath on Monday, and needs to be evaluated by Physical Therapy.   ? *[facility] - Patient is already an established patient in the [facility].  Patient's next appointment is scheduled for 10/20/2017 at 12:40 p.m.  This RN informed the patient that we will reschedule this appointment if need be.  Patient informed this RN that she does not drive and her daughter is the one she relies on for transportation.  Her daughter needs a two week notice in order to be able to get off work and take her to an appointment.   ?  Roanna Epley, RN, BSN, [facility]? [facility]  Cardiovascular &?Pulmonary Nurse Navigator  Direct Line: [phone]  Department Phone #: [phone] Fax: [phone]? Email Address: [email or web address]

## 2017-10-16 NOTE — Progress Notes (Signed)
* [facility]     Assessment and Plan:  Systolic, diastolic combined congestive heart failure. Atrial fibrillation status post cardioversion. Oliguric renal failure with volume overload. Pulmonary edema Obstructive sleep apnea History of pulmonary embolism, complicated by [name] on anticoagulation.  Pulmonary hypertension  - Continue management of heart failure and renal failure, volume overload. - Cardiology is considering left heart catheterization, will discuss with them regarding possible right heart catheterization to be performed at the same time, given her history of pulmonary hypertension. - Continue sildenafil for pulmonary hypertension. - Nightly CPAP for obstructive sleep apnea.  At a pressure of 13.   Date: 10/16/2017  MRN# [id number] Debra Saunders [birth year]   Debra Saunders is a 72 y.o. old female seen in follow up for chief complaint of  Chief Complaint  Patient presents with  . Shortness of Breath     Synopsis Patient is a 72 year old female with a history of chronic combined systolic and diastolic heart failure, A. 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  I have seen her in the outpatient setting where he was noted she has a history of pulmonary hypertension maintained on Letairis.  She was first diagnosed with pulmonary hypertension 2004, thought to be multifactorial due to congestive heart failure, thromboembolic disease, as well as pulmonary disease.  She was also noted to have obstructive sleep apnea, recommended to be on CPAP at 13.  Significant events: 4/10 patient presented to the emergency department on due to increased shortness of breath and weight gain over the past few weeks, combined with lower extremity and abdominal swelling.  She was diagnosed with volume overload and CHF, and started on diuresis.     10/01/2017 echocardiogram showed normal ejection fraction with a PA pressure estimated at 69 mmHg. 4/16 subsequently her course was complicated by COPD exacerbation, oliguric renal failure, initiated on hemodialysis. 4/18 the patient developed respiratory distress, was placed on BiPAP and transferred to the intensive care unit.  Chest x-ray at that time suggest pulmonary edema, patient was diuresed further and had subsequent improvement,  4/21 she was then transferred back to general medical floor.  4/22 she under went cardioversion for atrial fibrillation.. 4/23 patient experienced a significant nosebleed; echo on 4/23 shows EF of 40%, pulmonary artery pressure of 65. 4/25 patient was seen by palliative care service, she wished to maintain her full CODE STATUS consistent with my previous discussions with her in the office.  Subjective: Patient currently feels that her breathing has improved.  She has no current complaints, she notes that she is sleeping lately with CPAP, including last night and does well with it.  Her nosebleed appears to have resolved.  Medication:    Current Facility-Administered Medications:  .  0.9 %  sodium chloride infusion, 250 mL, Intravenous, PRN, Salary, Avel Peace, MD, Stopped at 10/12/17 0830 .  acetaminophen (TYLENOL) tablet 650 mg, 650 mg, Oral, Q4H PRN, Salary, Montell D, MD, 650 mg at 10/13/17 2332 .  ALPRAZolam (XANAX) tablet 0.25 mg, 0.25 mg, Oral, BID PRN, Salary, Montell D, MD, 0.25 mg at 10/13/17 0851 .  alum & mag hydroxide-simeth (MAALOX/MYLANTA) 200-200-20 MG/5ML suspension 30 mL, 30 mL, Oral, Q6H PRN, Demetrios Loll, MD, 30 mL at 10/15/17 2155 .  amiodarone (PACERONE) tablet 200 mg, 200 mg, Oral, BID, Deboraha Sprang, MD, 200 mg at 10/15/17 2150 .  benzonatate (TESSALON) capsule 100 mg, 100 mg, Oral,  TID PRN, Demetrios Loll, MD, 100 mg at 10/15/17 1719 .  budesonide (PULMICORT) nebulizer solution 0.5 mg, 0.5 mg, Nebulization, BID, Leslye Peer, Richard, MD, 0.5 mg  at 10/16/17 0737 .  carvedilol (COREG) tablet 3.125 mg, 3.125 mg, Oral, BID WC, Minna Merritts, MD, Stopped at 10/16/17 [phone]  chlorhexidine (PERIDEX) 0.12 % solution 15 mL, 15 mL, Mouth Rinse, BID, Tukov-Yual, Magdalene S, NP, 15 mL at 10/15/17 2150 .  clopidogrel (PLAVIX) tablet 75 mg, 75 mg, Oral, Daily, Deboraha Sprang, MD, 75 mg at 10/15/17 0857 .  gabapentin (NEURONTIN) capsule 300 mg, 300 mg, Oral, QHS, Wieting, Richard, MD, 300 mg at 10/15/17 2150 .  heparin ADULT infusion 100 units/mL (25000 units/276mL sodium chloride 0.45%), 850 Units/hr, Intravenous, Continuous, Dustin Flock, MD, Last Rate: 8.5 mL/hr at 10/15/17 1458, 850 Units/hr at 10/15/17 1458 .  hydrALAZINE (APRESOLINE) tablet 10 mg, 10 mg, Oral, TID, Rockey Situ, Kathlene November, MD, 10 mg at 10/15/17 2150 .  insulin aspart (novoLOG) injection 0-15 Units, 0-15 Units, Subcutaneous, TID WC, Conforti, John, DO, 2 Units at 10/16/17 0831 .  insulin aspart (novoLOG) injection 0-5 Units, 0-5 Units, Subcutaneous, QHS, Conforti, John, DO, 4 Units at 10/13/17 2200 .  insulin aspart (novoLOG) injection 3 Units, 3 Units, Subcutaneous, TID WC, Dustin Flock, MD, 3 Units at 10/16/17 0831 .  insulin glargine (LANTUS) injection 10 Units, 10 Units, Subcutaneous, Q24H, Dustin Flock, MD, 10 Units at 10/15/17 2150 .  ipratropium-albuterol (DUONEB) 0.5-2.5 (3) MG/3ML nebulizer solution 3 mL, 3 mL, Nebulization, Q4H PRN, Leslye Peer, Richard, MD, 3 mL at 10/08/17 1724 .  ipratropium-albuterol (DUONEB) 0.5-2.5 (3) MG/3ML nebulizer solution 3 mL, 3 mL, Nebulization, TID, Leslye Peer, Richard, MD, 3 mL at 10/16/17 0738 .  MEDLINE mouth rinse, 15 mL, Mouth Rinse, q12n4p, Tukov-Yual, Magdalene S, NP, 15 mL at 10/12/17 1654 .  menthol-cetylpyridinium (CEPACOL) lozenge 3 mg, 1 lozenge, Oral, PRN, Loletha Grayer, MD, 3 mg at 10/12/17 0845 .  multivitamin with minerals tablet 1 tablet, 1 tablet, Oral, Daily, Loletha Grayer, MD, 1 tablet at 10/15/17 0857 .  mupirocin  ointment (BACTROBAN) 2 %, , Nasal, BID, Dustin Flock, MD .  ondansetron (ZOFRAN) injection 4 mg, 4 mg, Intravenous, Q6H PRN, Salary, Montell D, MD, 4 mg at 10/03/17 1213 .  ondansetron (ZOFRAN) tablet 8 mg, 8 mg, Oral, Q8H PRN, Salary, Montell D, MD .  oxyCODONE-acetaminophen (PERCOCET/ROXICET) 5-325 MG per tablet 1-2 tablet, 1-2 tablet, Oral, Q6H PRN, Jodell Cipro, Prasanna, MD .  pantoprazole (PROTONIX) EC tablet 40 mg, 40 mg, Oral, Daily, Demetrios Loll, MD, 40 mg at 10/16/17 0441 .  promethazine (PHENERGAN) tablet 25 mg, 25 mg, Oral, Q4H PRN, Salary, Montell D, MD .  rosuvastatin (CRESTOR) tablet 10 mg, 10 mg, Oral, Daily, Salary, Montell D, MD, 10 mg at 10/15/17 0857 .  sildenafil (REVATIO) tablet 20 mg, 20 mg, Oral, TID, Salary, Montell D, MD, 20 mg at 10/15/17 1719 .  sodium chloride flush (NS) 0.9 % injection 3 mL, 3 mL, Intravenous, Q12H, Salary, Montell D, MD, 3 mL at 10/11/17 0924 .  sodium chloride flush (NS) 0.9 % injection 3 mL, 3 mL, Intravenous, PRN, Salary, Montell D, MD .  torsemide (DEMADEX) tablet 20 mg, 20 mg, Oral, Daily, Dunn, Ryan M, PA-C   Allergies:  Aspirin; Other; and Penicillins  Review of Systems: Patient denies hemoptysis, further epistaxis, rhinitis. Other:  All other systems were reviewed and found to be negative other than what is mentioned in the HPI.   Physical Examination:   VS: BP  114/77 (BP Location: Right Arm)   Pulse 63   Temp 98.1 F (36.7 C) (Oral)   Resp 18   Ht 5\' 1"  (1.549 m)   Wt 250 lb 9.6 oz (113.7 kg)   SpO2 97%   BMI 47.35 kg/m    General Appearance: No distress  Neuro:without focal findings,  speech normal,  HEENT: PERRLA, EOM intact. Pulmonary: normal breath sounds, No wheezing.   CardiovascularNormal S1,S2.  No m/r/g.   Abdomen: Benign, Soft, non-tender. Renal:  No costovertebral tenderness  GU:  Not performed at this time. Endoc: No evident thyromegaly, no signs of acromegaly. Skin:   warm, no rash. Extremities: normal, no  cyanosis, clubbing.   LABORATORY PANEL:   CBC Recent Labs  Lab 10/16/17 0412  WBC 8.9  HGB 9.7*  HCT 29.9*  PLT 167   ------------------------------------------------------------------------------------------------------------------  Chemistries  Recent Labs  Lab 10/10/17 0359  10/16/17 0413  NA 134*   < > 137  K 4.5   < > 4.2  CL 99*   < > 103  CO2 29   < > 29  GLUCOSE 216*   < > 148*  BUN 68*   < > 94*  CREATININE 2.10*   < > 1.70*  CALCIUM 9.3   < > 9.0  MG 1.9  --   --    < > = values in this interval not displayed.   ------------------------------------------------------------------------------------------------------------------  Cardiac Enzymes Recent Labs  Lab 10/09/17 1420  TROPONINI 2.35*   ------------------------------------------------------------  RADIOLOGY:   No results found for this or any previous visit. Results for orders placed during the hospital encounter of 09/30/17  DG Chest 2 View   Narrative CLINICAL DATA:  Shortness of breath and weight gain of 17 pounds over the past several weeks. Suspect CHF exacerbation. History of atrial fibrillation, CHF, COPD, former smoker.  EXAM: CHEST - 2 VIEW  COMPARISON:  Portable chest x-ray of August 25, 2017  FINDINGS: The right hemidiaphragm remains higher than the left. The right lung is hypoinflated. The left lung is better inflated. The interstitial markings are increased bilaterally with patchy areas of airspace opacity. The cardiac silhouette is enlarged. The pulmonary vascularity is engorged and indistinct. There are calcifications in the wall of the aortic arch. The ICD is in stable position. There is no significant pleural effusion.  IMPRESSION: Increased interstitial markings bilaterally consistent with interstitial and early alveolar edema. Certainly pneumonia is not entirely excluded. No significant pleural effusion.  Thoracic aortic atherosclerosis.   Electronically Signed    By: David  Martinique M.D.   On: 09/30/2017 16:14    ------------------------------------------------------------------------------------------------------------------  Thank  you for allowing [facility] to assist in the care of your patient. Our recommendations are noted above.  Please contact us if we can be of further service.   Marda Stalker, MD.  [facility] Office Number: [phone]  Patricia Pesa, M.D.  Merton Border, M.D  10/16/2017

## 2017-10-16 NOTE — Progress Notes (Signed)
Patient ID: Debra Saunders, female   DOB: 06/03/1946, 72 y.o.   MRN: [id number]   [facility] PROGRESS NOTE  Debra Saunders OBS:[id number] DOB: 1945-12-26 DOA: 09/30/2017 PCP: [name], MD  HPI/Subjective:  The patient has no complaints except bilateral leg swelling.  On oxygen 4 L by nasal cannula.  Blood pressure is low side.   Vitals:   10/16/17 0738 10/16/17 1011  BP:  (!) 95/56  Pulse:  (!) 59  Resp:  18  Temp:    SpO2: 97% 99%    Filed Weights   10/14/17 0329 10/15/17 0534 10/16/17 0422  Weight: 251 lb (113.9 kg) 249 lb 6.4 oz (113.1 kg) 250 lb 9.6 oz (113.7 kg)     CONSTITUTIONAL: No documented fever. No fatigue, weakness. No weight gain, no weight loss.  EYES: No blurry or double vision.  ENT: No tinnitus. No postnasal drip. No redness of the oropharynx.  RESPIRATORY: No cough, no wheeze, no hemoptysis.  Positive dyspnea.  CARDIOVASCULAR: No chest pain. No orthopnea. No palpitations. No syncope.  GASTROINTESTINAL: No nausea, no vomiting or diarrhea. No abdominal pain. No melena or hematochezia.  GENITOURINARY:  No urgency. No frequency. No dysuria. No hematuria. No obstructive symptoms. No discharge. No pain. No significant abnormal bleeding ENDOCRINE: No polyuria or nocturia. No heat or cold intolerance.  HEMATOLOGY: No anemia. No bruising. No bleeding. No purpura. No petechiae INTEGUMENTARY: No rashes. No lesions.  MUSCULOSKELETAL: No arthritis.  Bilateral leg swelling. NEUROLOGIC: No numbness, tingling, or ataxia. No seizure-type activity.  PSYCHIATRIC: No anxiety. No insomnia. No ADD.     Exam: Physical Exam  Constitutional: She is oriented to person, place, and time. She appears well-developed.  HENT:  Head: Normocephalic.  Nose: No mucosal edema.  Mouth/Throat: Oropharynx is clear and moist. No oropharyngeal exudate.  Eyes: Pupils are equal, round, and reactive to light. Conjunctivae, EOM and lids are normal.  Neck: Normal range of  motion. Neck supple. No JVD present. Carotid bruit is not present. No edema present. No thyroid mass and no thyromegaly present.  Cardiovascular: Regular rhythm, S1 normal and S2 normal. Exam reveals no gallop.  No murmur heard. Pulses:      Dorsalis pedis pulses are 2+ on the right side, and 2+ on the left side.  Respiratory: Effort normal and breath sounds normal. No accessory muscle usage. No respiratory distress. She has no wheezes. She has no rhonchi. She has no rales.  GI: Soft. Bowel sounds are normal. There is no tenderness.  Musculoskeletal: Normal range of motion. She exhibits edema.       Right ankle: She exhibits swelling.       Left ankle: She exhibits swelling.  Lymphadenopathy:    She has no cervical adenopathy.  Neurological: She is alert and oriented to person, place, and time. No cranial nerve deficit.  Skin: Skin is warm. No rash noted. Nails show no clubbing.  Psychiatric: She has a normal mood and affect.      Data Reviewed: Basic Metabolic Panel: Recent Labs  Lab 10/10/17 0359 10/11/17 0423 10/13/17 0524 10/14/17 1029 10/15/17 0336 10/16/17 0413  NA 134* 134* 135 135 TEST WILL BE CREDITED  135 137  K 4.5 4.0 4.3 4.6 TEST WILL BE CREDITED  4.7 4.2  CL 99* 99* 101 101 TEST WILL BE CREDITED  101 103  CO2 29 27 27 25  TEST WILL BE CREDITED  26 29  GLUCOSE 216* 206* 235* 233* TEST WILL BE CREDITED  247* 148*  BUN 68* 82* 102* 103* TEST WILL BE CREDITED  99* 94*  CREATININE 2.10* 2.57* 2.70* 2.26* TEST WILL BE CREDITED  2.07* 1.70*  CALCIUM 9.3 9.1 9.4 9.6 TEST WILL BE CREDITED  9.3 9.0  MG 1.9  --   --   --   --   --   PHOS 5.1*  --   --  5.6* 5.0*  --    Acute kidney injury CBC: Recent Labs  Lab 10/12/17 0417 10/13/17 0524 10/14/17 0336 10/15/17 0336 10/16/17 0412  WBC 8.9 9.6 9.2 9.3 8.9  HGB 9.9* 10.0* 10.1* 10.3* 9.7*  HCT 31.2* 30.4* 31.2* 32.1* 29.9*  MCV 85.5 84.8 86.0 86.2 86.0  PLT 207 211 205 202 167   BNP (last 3  results) Recent Labs    08/04/17 1552 08/16/17 0925 09/30/17 1545  BNP 175.0* 655.0* 1,151.0*     CBG: Recent Labs  Lab 10/15/17 2037 10/15/17 2148 10/16/17 0741 10/16/17 1146 10/16/17 1209  GLUCAP 173* 161* 138* 65 89    No results found for this or any previous visit (from the past 240 hour(s)).    Scheduled Meds: . amiodarone  200 mg Oral BID  . budesonide (PULMICORT) nebulizer solution  0.5 mg Nebulization BID  . carvedilol  3.125 mg Oral BID WC  . chlorhexidine  15 mL Mouth Rinse BID  . clopidogrel  75 mg Oral Daily  . gabapentin  300 mg Oral QHS  . hydrALAZINE  10 mg Oral TID  . insulin aspart  0-15 Units Subcutaneous TID WC  . insulin aspart  0-5 Units Subcutaneous QHS  . ipratropium-albuterol  3 mL Nebulization TID  . mouth rinse  15 mL Mouth Rinse q12n4p  . multivitamin with minerals  1 tablet Oral Daily  . mupirocin ointment   Nasal BID  . pantoprazole  40 mg Oral Daily  . rosuvastatin  10 mg Oral Daily  . sildenafil  20 mg Oral TID  . sodium chloride flush  3 mL Intravenous Q12H  . torsemide  20 mg Oral Daily   Continuous Infusions: . sodium chloride Stopped (10/12/17 0830)  . heparin 850 Units/hr (10/15/17 1458)    Assessment/Plan:  1. Acute on chronic respiratory failure in a setting of acute on chronic diastolic CHF with flash pulmonary edema , she was on low-dose Lasix, torsemide 20 mg p.o. daily now.  Continue home oxygen 4 L. 2. Acute kidney injury on CKD stage 3.    Improving,  3. NSTEMI/Cardiomyopathy EF 30-35%,  continue IV heparin and may transition to  Eliquis once decided if dialysis is needed or not.  Troponin was elevated at 2.7 on April 19.  No cardiac cath due to renal failure.  Per Dr. Rockey Situ, consider cardiac cath on Monday. acute on chronic diastolic congestive heart failure.  Patient has moderate to severe mitral valve regurgitation.     She was n low-dose Lasix and changed to torsemide 20 mg p.o. Daily. Holding torsemide 20mg  PO  daily per Dr. Juleen China. Chronic atrial fibrillation status post cardioversion.  Continue amiodarone.  Would continue anticoagulation 4. Nosebleed stopped. 5. chronic hypoxic hypercarbic respiratory failure.  Chronically on 4 L of oxygen. 6. Morbid obesity.  Weight loss needed 7. COPD exacerbation, improved obese IV steroids and nebulizer treatments.    Discontinued IV steroids.  8. Hyperlipidemia unspecified on Crestor 9. Type 2 diabetes mellitus on sliding scale insulin,. 10. Upper respiratory tract infection  status post treatment with oral antibiotic 11. Hyperlipidemia unspecified on Crestor 12.  PAH. Cont sildenafil.  Pulmonary consult. Requested Palliative care consult due to multiple medical problems and poor prognosis. Code Status:     Code Status Orders  (From admission, onward)        Start     Ordered   09/30/17 1901  Full code  Continuous     09/30/17 1901    Code Status History    Date Active Date Inactive Code Status Order ID Comments User Context   08/16/2017 1610 08/28/2017 2239 Full Code [id number]  Gorden Harms, MD Inpatient   03/30/2015 2152 04/03/2015 2122 Full Code [id number]  Etta Quill, DO Inpatient   03/27/2015 2208 03/28/2015 1737 Full Code [id number]  Nicholes Mango, MD ED   01/09/2014 1418 01/10/2014 1803 Full Code [id number]  Deboraha Sprang, MD Inpatient   01/16/2013 2335 01/27/2013 2011 Full Code [id number]  Collene Gobble, MD Inpatient     Disposition Plan:  TBD  Consultants:  Cardiology  Time spent: 36 minutes, case discussed with the patient, RN and CM. Demetrios Loll  [employer]

## 2017-10-16 NOTE — Progress Notes (Signed)
[name] for heparin Indication: atrial fibrillation  Allergies  Allergen Reactions  . Aspirin Hives and Other (See Comments)    Pt states that she is unable to take because she had bleeding in her brain.    . Other Other (See Comments)    Pt states that she is unable to take blood thinners because she had bleeding in her brain.  Blood Thinners-per doctor at [facility]  . Penicillins Hives, Shortness Of Breath, Swelling and Other (See Comments)    Has patient had a PCN reaction causing immediate rash, facial/tongue/throat swelling, SOB or lightheadedness with hypotension: Yes Has patient had a PCN reaction causing severe rash involving mucus membranes or skin necrosis: No Has patient had a PCN reaction that required hospitalization No Has patient had a PCN reaction occurring within the last 10 years: No If all of the above answers are "NO", then may proceed with Cephalosporin use.    Patient Measurements: Height: 5\' 1"  (154.9 cm) Weight: 250 lb 9.6 oz (113.7 kg) IBW/kg (Calculated) : 47.8 Heparin Dosing Weight: 74 kg  Vital Signs: Temp: 98.1 F (36.7 C) (04/26 0422) Temp Source: Oral (04/26 0422) BP: 114/77 (04/26 0422) Pulse Rate: 63 (04/26 0422)  Labs: Recent Labs    10/14/17 0336 10/14/17 1029 10/15/17 0336 10/16/17 0412  HGB 10.1*  --  10.3* 9.7*  HCT 31.2*  --  32.1* 29.9*  PLT 205  --  202 167  HEPARINUNFRC 0.56  --  0.61 0.54  CREATININE  --  2.26* TEST WILL BE CREDITED  2.07*  --     Estimated Creatinine Clearance: 27.9 mL/min (A) (by C-G formula based on SCr of 2.07 mg/dL (H)).   Medical History: Past Medical History:  Diagnosis Date  . Arthritis   . Atrial fibrillation, persistent ([place])    a. s/p TEE-guided DCCV on 08/20/2017  . Chest pain    a. 01/2012 Cath: nonobs dzs;  c. 04/2014 MV: No ischemia.  . Chronic back pain   . Chronic combined systolic (congestive) and diastolic (congestive) heart failure ([place])     a. Dx 2005 @ [facility];  b. 02/2014 Echo: Ef 55-60%, no rwma, mild MR, PASP 61mmHg. c. 07/2017: EF 30-35%, diffuse HK, trivial AI, severe MR, moderate TR  . Chronic respiratory failure (HCC)    a. on home O2  . COPD (chronic obstructive pulmonary disease) ([place])    a. Home O2 - 3lpm  . Gastritis   . History of intracranial hemorrhage    a. 6/14 described by neurosurgery as "small frontal posttraumatic SAH " - pt was on xarelto @ the time.  . Hyperlipidemia   . Hypertension   . Lipoma of back   . Lymphedema    a. Chronic LLE edema.  . Obesity   . Sepsis with metabolic encephalopathy ([place])   . Sleep apnea   . Streptococcal bacteremia   . Stroke ([place])   . Symptomatic bradycardia    a. s/p BSX PPM.  . Thyroid nodule   . Type II diabetes mellitus ([place])   . Urinary incontinence     Medications:  Patient takes apixaban 5mg  BID at home for afib. She was initially started on apixaban 5mg  BID when she was admitted, but her dose was changed to apixaban 2.5mg  BID on 4/14 due to her increase in Scr (1.4 --> 3.1). Cardiology stated that decrease in apixaban dose was for instability of patient's renal function.   Assessment: Pharmacy consulted for heparin  drip management for 72 yo female transitioned from apixaban to heparin secondary to decline in renal function. Patient with past medical history significant for atrial fibrillation and heart failure. Patient currently receiving heparin at 850 units/hr.  Goal of Therapy:  Heparin level 0.3-0.7 units/ml  APTT 66-102s Monitor platelets by anticoagulation protocol: Yes   Plan:  Will continue heparin infusion at 850 units/hr and recheck in AM.   04/24 @ 0300 HL 0.56 therapeutic. Will continue current rate and will recheck HL/CBC w/ am labs.  04/25 @ 0330 HL 0.61 therapeutic. Will continue current rate and will recheck w/ am labs.  04/26 @ 0400 HL 0.54 therapeutic. Will continue current rate and will recheck w/ am labs.  Tobie Lords, PharmD,  BCPS Clinical Pharmacist 10/16/2017

## 2017-10-16 NOTE — Progress Notes (Signed)
Inpatient Diabetes Program Recommendations  AACE/ADA: New Consensus Statement on Inpatient Glycemic Control (2015)  Target Ranges:  Prepandial:   less than 140 mg/dL      Peak postprandial:   less than 180 mg/dL (1-2 hours)      Critically ill patients:  140 - 180 mg/dL   Lab Results  Component Value Date   GLUCAP 65 10/16/2017   HGBA1C 5.8 (H) 09/30/2017    Review of Glycemic ControlResults for CHARLYE, SPARE (MRN [id number]) as of 10/16/2017 12:07  Ref. Range 10/15/2017 16:30 10/15/2017 20:37 10/15/2017 21:48 10/16/2017 07:41 10/16/2017 11:46  Glucose-Capillary Latest Ref Range: 65 - 99 mg/dL 206 (H) 173 (H) 161 (H) 138 (H) 65   Diabetes history: DM2 Outpatient Diabetes medications: Metformin 3672 mg BID, [name] 5.50 mg weekly Current orders for Inpatient glycemic control: Novolog 0-15 units TID with meals, Novolog 0-5 units QHS; Lantus 10 units q HS, and Novolog 3 units tid with meals Inpatient Diabetes Program Recommendations:   Note steroids stopped.  Therefore blood sugars improved.  Consider d/c of Lantus and Novolog meal coverage since patient no longer on steroids. Text page sent.  Thanks,  Adah Perl, RN, BC-ADM Inpatient Diabetes Coordinator Pager [phone] (8a-5p)

## 2017-10-16 NOTE — Plan of Care (Signed)
Encourage patient to engage in more activity. Patient complained of chest pain X1. Received orders for protonix to help with indigestion.

## 2017-10-16 NOTE — Evaluation (Signed)
Physical Therapy Evaluation Patient Details Name: Debra Saunders MRN: [id number] DOB: Mar 31, 1946 Today's Date: 10/16/2017   History of Present Illness  presented to ER secondary to SOB, weigth gain; admitted with CHF exacerbation, volume overload.  Hospital course complicated by worsening renal failure requiring temporary dialysis (temp fem cath removed 4/18); worsening cardio-renal syndrome with severe respiratory distress requiring BiPAP and transfer to CCU; NSTEMI (troponin 2.6).  Status post cardioversion 4/22 due to afib with RVR; pending cardiac cath 4/29.  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  No additional treatment planned at this time; no activity restrictions (confirmed via Dr. Bridgett Larsson)  Clinical Impression  Upon evaluation, patient alert and oriented; follows all commands.  Eager for OOB and mobility efforts as tolerated.  Bilat UE/LE strength generally deconditioned due to extended hospitalization, but functional for basic transfers and mobility (4-/5).  Able to complete bed mobility, sit/stand, basic transfers and gait (2') with RW, cga/min assist.  Slow and steady with broad BOS, but no overt buckling or LOB noted.  Additional distance/activity limited by fatigue, though sats remain >94% throughout (4L). Would benefit from skilled PT to address above deficits and promote optimal return to PLOF; recommend transition to STR upon discharge from acute hospitalization.  Unable to tolerate distances or functional activities require to facilitate safe discharge home alone at this time; will continue to assess/update recommendations as appropriate.     Follow Up Recommendations SNF    Equipment Recommendations  Rolling walker with 5" wheels    Recommendations for Other Services       Precautions / Restrictions Precautions Precautions: Fall Restrictions Weight Bearing Restrictions: No       Mobility  Bed Mobility Overal bed mobility: Needs Assistance Bed Mobility: Supine to Sit     Supine to sit: Min assist        Transfers Overall transfer level: Needs assistance Equipment used: Rolling walker (2 wheeled) Transfers: Sit to/from Stand Sit to Stand: Min assist Stand pivot transfers: Min assist       General transfer comment: requires UE support for lift off and stabilization; broad BOS  Ambulation/Gait Ambulation/Gait assistance: Min assist Ambulation Distance (Feet): 25 Feet Assistive device: Rolling walker (2 wheeled)       General Gait Details: slow but steady without buckling or LOB; limited by SOB  Stairs            Wheelchair Mobility    Modified Rankin (Stroke Patients Only)       Balance Overall balance assessment: Needs assistance Sitting-balance support: Feet supported;No upper extremity supported Sitting balance-Leahy Scale: Good     Standing balance support: Bilateral upper extremity supported Standing balance-Leahy Scale: Fair                               Pertinent Vitals/Pain Pain Assessment: No/denies pain    Home Living Family/patient expects to be discharged to:: Private residence Living Arrangements: Alone Available Help at Discharge: Family;Friend(s);Available PRN/intermittently Type of Home: House Home Access: Ramped entrance     Home Layout: One level Home Equipment: Walker - 2 wheels;Walker - 4 wheels      Prior Function Level of Independence: Independent with assistive device(s)         Comments: Mod indep with 4WRW for ADLs, household distances.  Pt only out of the house for MD appointments; states family checks  in occasionally.  Has groceries delivered.     Hand Dominance        Extremity/Trunk Assessment   Upper Extremity Assessment Upper Extremity Assessment: Overall WFL for tasks assessed    Lower Extremity Assessment Lower Extremity Assessment: Generalized  weakness(grossly 4-/5 throughout bilat LEs; L LE generally edematous (utilizes lymphadema pumps at home))       Communication   Communication: No difficulties  Cognition Arousal/Alertness: Awake/alert Behavior During Therapy: WFL for tasks assessed/performed Overall Cognitive Status: Within Functional Limits for tasks assessed                                        General Comments      Exercises Other Exercises Other Exercises: Seated LE therex, 1x12, AROM for musuclar strength/endurance with functional activities.  Intermittent rest periods required due to SOB   Assessment/Plan    PT Assessment Patient needs continued PT services  PT Problem List Decreased strength;Decreased activity tolerance;Decreased range of motion;Decreased balance;Decreased mobility;Decreased coordination;Decreased knowledge of use of DME;Decreased safety awareness;Cardiopulmonary status limiting activity;Decreased skin integrity       PT Treatment Interventions DME instruction;Gait training;Stair training;Functional mobility training;Therapeutic activities;Therapeutic exercise;Balance training;Cognitive remediation;Patient/family education    PT Goals (Current goals can be found in the Care Plan section)  Acute Rehab PT Goals Patient Stated Goal: to get back to my house PT Goal Formulation: With patient Time For Goal Achievement: 10/30/17 Potential to Achieve Goals: Fair    Frequency Min 2X/week   Barriers to discharge Decreased caregiver support      Co-evaluation               AM-PAC PT "6 Clicks" Daily Activity  Outcome Measure Difficulty turning over in bed (including adjusting bedclothes, sheets and blankets)?: Unable Difficulty moving from lying on back to sitting on the side of the bed? : Unable Difficulty sitting down on and standing up from a chair with arms (e.g., wheelchair, bedside commode, etc,.)?: Unable Help needed moving to and from a bed to chair  (including a wheelchair)?: A Little Help needed walking in hospital room?: A Little Help needed climbing 3-5 steps with a railing? : A Lot 6 Click Score: 11    End of Session Equipment Utilized During Treatment: Gait belt;Oxygen Activity Tolerance: Patient tolerated treatment well Patient left: with call bell/phone within reach;in chair;with chair alarm set Nurse Communication: Mobility status PT Visit Diagnosis: Unsteadiness on feet (R26.81);Muscle weakness (generalized) (M62.81);Difficulty in walking, not elsewhere classified (R26.2)    Time: 1829-9371 PT Time Calculation (min) (ACUTE ONLY): 29 min   Charges:   PT Evaluation $PT Re-evaluation: 1 Re-eval PT Treatments $Therapeutic Exercise: 8-22 mins   PT G Codes:        Luba Matzen H. Owens Shark, PT, DPT, NCS 10/16/17, 4:54 PM [phone]

## 2017-10-17 LAB — HEPARIN LEVEL (UNFRACTIONATED)
Heparin Unfractionated: 0.98 IU/mL — ABNORMAL HIGH (ref 0.30–0.70)
Heparin Unfractionated: 1.04 IU/mL — ABNORMAL HIGH (ref 0.30–0.70)

## 2017-10-17 LAB — GLUCOSE, CAPILLARY
Glucose-Capillary: 99 mg/dL (ref 65–99)
Glucose-Capillary: 139 mg/dL — ABNORMAL HIGH (ref 65–99)
Glucose-Capillary: 266 mg/dL — ABNORMAL HIGH (ref 65–99)
Glucose-Capillary: 172 mg/dL — ABNORMAL HIGH (ref 65–99)

## 2017-10-17 LAB — BASIC METABOLIC PANEL
Anion gap: 5 (ref 5–15)
BUN: 75 mg/dL — ABNORMAL HIGH (ref 6–20)
CO2: 29 mmol/L (ref 22–32)
Calcium: 9.1 mg/dL (ref 8.9–10.3)
Chloride: 105 mmol/L (ref 101–111)
Creatinine, Ser: 1.48 mg/dL — ABNORMAL HIGH (ref 0.44–1.00)
GFR calc Af Amer: 39 mL/min — ABNORMAL LOW (ref >60)
GFR calc non Af Amer: 34 mL/min — ABNORMAL LOW (ref >60)
Glucose, Bld: 112 mg/dL — ABNORMAL HIGH (ref 65–99)
Potassium: 4.3 mmol/L (ref 3.5–5.1)
Sodium: 139 mmol/L (ref 135–145)

## 2017-10-17 LAB — CBC
HCT: 29.2 % — ABNORMAL LOW (ref 35.0–47.0)
Hemoglobin: 9.4 g/dL — ABNORMAL LOW (ref 12.0–16.0)
MCH: 27.6 pg (ref 26.0–34.0)
MCHC: 32 g/dL (ref 32.0–36.0)
MCV: 86 fL (ref 80.0–100.0)
Platelets: 164 10*3/uL (ref 150–440)
RBC: 3.4 MIL/uL — ABNORMAL LOW (ref 3.80–5.20)
RDW: 17.2 % — ABNORMAL HIGH (ref 11.5–14.5)
WBC: 6.2 10*3/uL (ref 3.6–11.0)

## 2017-10-17 LAB — MAGNESIUM: Magnesium: 1.9 mg/dL (ref 1.7–2.4)

## 2017-10-17 MED ORDER — HEPARIN (PORCINE) IN NACL 100-0.45 UNIT/ML-% IJ SOLN: 450.0000 [IU]/h | INTRAMUSCULAR | Status: DC

## 2017-10-17 MED ORDER — APIXABAN 2.5 MG PO TABS
2.5000 mg | ORAL_TABLET | Freq: Two times a day (BID) | ORAL | Status: DC
Start: 2017-10-17 — End: 2017-10-18
  Administered 2017-10-17 – 2017-10-18 (×3): 2.5 mg via ORAL
  Filled 2017-10-17 (×3): qty 1

## 2017-10-17 NOTE — Progress Notes (Signed)
[place] for heparin Indication: atrial fibrillation  Allergies  Allergen Reactions  . Aspirin Hives and Other (See Comments)    Pt states that she is unable to take because she had bleeding in her brain.    . Other Other (See Comments)    Pt states that she is unable to take blood thinners because she had bleeding in her brain.  Blood Thinners-per doctor at [facility]  . Penicillins Hives, Shortness Of Breath, Swelling and Other (See Comments)    Has patient had a PCN reaction causing immediate rash, facial/tongue/throat swelling, SOB or lightheadedness with hypotension: Yes Has patient had a PCN reaction causing severe rash involving mucus membranes or skin necrosis: No Has patient had a PCN reaction that required hospitalization No Has patient had a PCN reaction occurring within the last 10 years: No If all of the above answers are "NO", then may proceed with Cephalosporin use.    Patient Measurements: Height: 5\' 1"  (154.9 cm) Weight: 247 lb 12.8 oz (112.4 kg) IBW/kg (Calculated) : 47.8 Heparin Dosing Weight: 74 kg  Vital Signs: Temp: 97.8 F (36.6 C) (04/27 0250) Temp Source: Oral (04/27 0250) BP: 108/59 (04/27 0250) Pulse Rate: 62 (04/27 0250)  Labs: Recent Labs    10/15/17 0336 10/16/17 0412 10/16/17 0413 10/17/17 0420  HGB 10.3* 9.7*  --  9.4*  HCT 32.1* 29.9*  --  29.2*  PLT 202 167  --  164  HEPARINUNFRC 0.61 0.54  --  0.98*  CREATININE TEST WILL BE CREDITED  2.07*  --  1.70* 1.48*    Estimated Creatinine Clearance: 38.7 mL/min (A) (by C-G formula based on SCr of 1.48 mg/dL (H)).   Medical History: Past Medical History:  Diagnosis Date  . Arthritis   . Atrial fibrillation, persistent ([place])    a. s/p TEE-guided DCCV on 08/20/2017  . Chest pain    a. 01/2012 Cath: nonobs dzs;  c. 04/2014 MV: No ischemia.  . Chronic back pain   . Chronic combined systolic (congestive) and diastolic (congestive) heart failure ([place])     a. Dx 2005 @ [facility];  b. 02/2014 Echo: Ef 55-60%, no rwma, mild MR, PASP 43mmHg. c. 07/2017: EF 30-35%, diffuse HK, trivial AI, severe MR, moderate TR  . Chronic respiratory failure (HCC)    a. on home O2  . COPD (chronic obstructive pulmonary disease) ([place])    a. Home O2 - 3lpm  . Gastritis   . History of intracranial hemorrhage    a. 6/14 described by neurosurgery as "small frontal posttraumatic SAH " - pt was on xarelto @ the time.  . Hyperlipidemia   . Hypertension   . Lipoma of back   . Lymphedema    a. Chronic LLE edema.  . Obesity   . Sepsis with metabolic encephalopathy ([place])   . Sleep apnea   . Streptococcal bacteremia   . Stroke ([place])   . Symptomatic bradycardia    a. s/p BSX PPM.  . Thyroid nodule   . Type II diabetes mellitus ([place])   . Urinary incontinence     Medications:  Patient takes apixaban 5mg  BID at home for afib. She was initially started on apixaban 5mg  BID when she was admitted, but her dose was changed to apixaban 2.5mg  BID on 4/14 due to her increase in Scr (1.4 --> 3.1). Cardiology stated that decrease in apixaban dose was for instability of patient's renal function.   Assessment: Pharmacy consulted for heparin drip management  for 72 yo female transitioned from apixaban to heparin secondary to decline in renal function. Patient with past medical history significant for atrial fibrillation and heart failure. Patient currently receiving heparin at 850 units/hr.  Goal of Therapy:  Heparin level 0.3-0.7 units/ml  APTT 66-102s Monitor platelets by anticoagulation protocol: Yes   Plan:  Will continue heparin infusion at 850 units/hr and recheck in AM.   04/24 @ 0300 HL 0.56 therapeutic. Will continue current rate and will recheck HL/CBC w/ am labs.  04/25 @ 0330 HL 0.61 therapeutic. Will continue current rate and will recheck w/ am labs.  04/26 @ 0400 HL 0.54 therapeutic. Will continue current rate and will recheck w/ am labs.  04/27 @ 0420 HL 0.98  supratherapeutic. Will decrease rate to 700 units/hr and will recheck @ 1200. CBC trending down will continue to monitor.  Tobie Lords, PharmD, BCPS Clinical Pharmacist 10/17/2017

## 2017-10-17 NOTE — Progress Notes (Signed)
[facility]  ROUNDING NOTE   Subjective:   Laying in bed. States her edema and shortness of breath are improving.   Creatinine 1.48 (1.7)  Holding torsemide and carvedilol due to hypotension  Dopplers negative for DVT  Objective:  Vital signs in last 24 hours:  Temp:  [97.8 F (36.6 C)-98.2 F (36.8 C)] 98.2 F (36.8 C) (04/27 0809) Pulse Rate:  [59-62] 59 (04/27 0809) Resp:  [16-18] 18 (04/27 0809) BP: (102-108)/(58-91) 104/58 (04/27 0809) SpO2:  [94 %-100 %] 94 % (04/27 0841) Weight:  [112.4 kg (247 lb 12.8 oz)] 112.4 kg (247 lb 12.8 oz) (04/27 0250)  Weight change: -1.27 kg (-2 lb 12.8 oz) Filed Weights   10/15/17 0534 10/16/17 0422 10/17/17 0250  Weight: 113.1 kg (249 lb 6.4 oz) 113.7 kg (250 lb 9.6 oz) 112.4 kg (247 lb 12.8 oz)    Intake/Output: I/O last 3 completed shifts: In: 102 [I.V.:102] Out: 1600 [Urine:1600]   Intake/Output this shift:  No intake/output data recorded.  Physical Exam: General:  No acute distress  Head: Normocephalic, atraumatic.   Eyes: Anicteric  Neck: Supple, trachea midline  Lungs:  Scattered rhonchi, normal effort  Heart: S1S2 no rubs  Abdomen:  Soft, nontender, bowel sounds present  Extremities: 1+peripheral edema. Left > right  Neurologic: Awake, alert, following commands  Skin: No lesions       Basic Metabolic Panel: Recent Labs  Lab 10/13/17 0524 10/14/17 1029 10/15/17 0336 10/16/17 0413 10/17/17 0420  NA 135 135 TEST WILL BE CREDITED  135 137 139  K 4.3 4.6 TEST WILL BE CREDITED  4.7 4.2 4.3  CL 101 101 TEST WILL BE CREDITED  101 103 105  CO2 27 25 TEST WILL BE CREDITED  26 29 29   GLUCOSE 235* 233* TEST WILL BE CREDITED  247* 148* 112*  BUN 102* 103* TEST WILL BE CREDITED  99* 94* 75*  CREATININE 2.70* 2.26* TEST WILL BE CREDITED  2.07* 1.70* 1.48*  CALCIUM 9.4 9.6 TEST WILL BE CREDITED  9.3 9.0 9.1  MG  --   --   --   --  1.9  PHOS  --  5.6* 5.0*  --   --     Liver Function  Tests: Recent Labs  Lab 10/14/17 1029 10/15/17 0336  ALBUMIN 3.3* 3.2*   No results for input(s): LIPASE, AMYLASE in the last 168 hours. No results for input(s): AMMONIA in the last 168 hours.  CBC: Recent Labs  Lab 10/13/17 0524 10/14/17 0336 10/15/17 0336 10/16/17 0412 10/17/17 0420  WBC 9.6 9.2 9.3 8.9 6.2  HGB 10.0* 10.1* 10.3* 9.7* 9.4*  HCT 30.4* 31.2* 32.1* 29.9* 29.2*  MCV 84.8 86.0 86.2 86.0 86.0  PLT 211 205 202 167 164    Cardiac Enzymes: No results for input(s): CKTOTAL, CKMB, CKMBINDEX, TROPONINI in the last 168 hours.  BNP: Invalid input(s): POCBNP  CBG: Recent Labs  Lab 10/16/17 1146 10/16/17 1209 10/16/17 1641 10/16/17 2034 10/17/17 0809  GLUCAP 65 89 133* 122* 99    Microbiology: Results for orders placed or performed during the hospital encounter of 09/30/17  MRSA PCR Screening     Status: Abnormal   Collection Time: 10/01/17  9:37 AM  Result Value Ref Range Status   MRSA by PCR POSITIVE (A) NEGATIVE Final    Comment:        [name] (FDA approved for NASAL specimens only), is one component of a comprehensive MRSA colonization surveillance program. It is not  intended to diagnose MRSA infection nor to guide or monitor treatment for MRSA infections. RESULT CALLED TO, READ BACK BY AND VERIFIED WITH:  Gildardo Pounds AT 1207 10/01/17 SDR Performed at [facility], [address] [place], [place] [address]     Coagulation Studies: No results for input(s): LABPROT, INR in the last 72 hours.  Urinalysis: No results for input(s): COLORURINE, LABSPEC, PHURINE, GLUCOSEU, HGBUR, BILIRUBINUR, KETONESUR, PROTEINUR, UROBILINOGEN, NITRITE, LEUKOCYTESUR in the last 72 hours.  Invalid input(s): APPERANCEUR    Imaging: US Venous Img Lower Bilateral  Result Date: 10/16/2017 CLINICAL DATA:  72 year old female with a history of bilateral lower extremity edema and a prior left lower extremity DVT EXAM: BILATERAL LOWER  EXTREMITY VENOUS DOPPLER ULTRASOUND TECHNIQUE: Gray-scale sonography with graded compression, as well as color Doppler and duplex ultrasound were performed to evaluate the lower extremity deep venous systems from the level of the common femoral vein and including the common femoral, femoral, profunda femoral, popliteal and calf veins including the posterior tibial, peroneal and gastrocnemius veins when visible. The superficial great saphenous vein was also interrogated. Spectral Doppler was utilized to evaluate flow at rest and with distal augmentation maneuvers in the common femoral, femoral and popliteal veins. COMPARISON:  04/05/2014, 08/16/2013, 10/04/2012 FINDINGS: RIGHT LOWER EXTREMITY Common Femoral Vein: Common femoral vein patent with respiratory phasicity maintained and flow maintained. Saphenofemoral Junction: No evidence of thrombus. Normal compressibility and flow on color Doppler imaging. Profunda Femoral Vein: No evidence of thrombus. Normal compressibility and flow on color Doppler imaging. Femoral Vein: No evidence of thrombus. Normal compressibility, respiratory phasicity and response to augmentation. Popliteal Vein: No evidence of thrombus. Normal compressibility, respiratory phasicity and response to augmentation. Calf Veins: Peroneal vein patent with flow maintained and no thrombus. Occlusion of the right posterior tibial veins, of indeterminate chronicity. Superficial Great Saphenous Vein: No evidence of thrombus. Normal compressibility and flow on color Doppler imaging. Other Findings:  None. LEFT LOWER EXTREMITY Common Femoral Vein: Patent common femoral vein with flow maintained and respiratory phasicity maintained. Compressible vein. Saphenofemoral Junction: No evidence of thrombus. Normal compressibility and flow on color Doppler imaging. Profunda Femoral Vein: No evidence of thrombus. Normal compressibility and flow on color Doppler imaging. Femoral Vein: No evidence of thrombus. Normal  compressibility, respiratory phasicity and response to augmentation. Popliteal Vein: No evidence of thrombus. Normal compressibility, respiratory phasicity and response to augmentation. Calf Veins: Peroneal vein patent with flow maintained and compressible. One of the paired posterior tibial veins is occluded, indeterminate chronicity though was occluded on comparison studies of 2015 Superficial Great Saphenous Vein: No evidence of thrombus. Normal compressibility and flow on color Doppler imaging. Other Findings:  None. IMPRESSION: Negative sonographic survey for DVT of the bilateral common femoral vein, femoral vein, popliteal veins. The right-sided posterior tibial veins (calf) are occluded, indeterminate chronicity, and 1 of the paired left posterior tibial veins (calf) is occluded, likely chronic. Signed, Dulcy Fanny. Earleen Newport, DO [facility] Radiology Electronically Signed   By: Corrie Mckusick D.O.   On: 10/16/2017 12:07     Medications:   . sodium chloride Stopped (10/12/17 0830)  . heparin 700 Units/hr (10/17/17 0656)   . amiodarone  200 mg Oral BID  . budesonide (PULMICORT) nebulizer solution  0.5 mg Nebulization BID  . carvedilol  3.125 mg Oral BID WC  . chlorhexidine  15 mL Mouth Rinse BID  . clopidogrel  75 mg Oral Daily  . gabapentin  300 mg Oral QHS  . hydrALAZINE  10 mg Oral TID  . insulin aspart  0-15 Units Subcutaneous TID WC  . insulin aspart  0-5 Units Subcutaneous QHS  . ipratropium-albuterol  3 mL Nebulization TID  . mouth rinse  15 mL Mouth Rinse q12n4p  . multivitamin with minerals  1 tablet Oral Daily  . mupirocin ointment   Nasal BID  . pantoprazole  40 mg Oral Daily  . rosuvastatin  10 mg Oral Daily  . sildenafil  20 mg Oral TID  . sodium chloride flush  3 mL Intravenous Q12H  . torsemide  20 mg Oral Daily   sodium chloride, acetaminophen, ALPRAZolam, alum & mag hydroxide-simeth, benzonatate, ipratropium-albuterol,  menthol-cetylpyridinium, ondansetron (ZOFRAN) IV, ondansetron, oxyCODONE-acetaminophen, promethazine, sodium chloride flush  Assessment/ Plan:  72 y.o.black female with a PMHx of chronic diastolic heart failure, atrial fibrillation, chronic hypercarbic respiratory failure, COPD, morbid obesity, hyperlipidemia, diabetes mellitus type 2, history of intracranial hemorrhage who was admitted to [facility] on 09/30/2017   1.  Acute renal failure: Acute renal failure secondary to acute cardiorenal syndrome and overdiuresis  Required two treatment of hemodialysis on 4/16 and 4/17. No acute indication for dialysis. Dialysis catheter has been removed.  Creatinine and BUN improving. Baseline creatinine of 0.83, GFR wnl, on 08/28/17. History of acute renal failure in 2014.  - Continue to monitor urine output.  - Holding torsemide    2. Hypertension with atrial fibrillation: status post cardioversion on 4/22. Hypotensive this morning. Holding home medications.  Acute exacerbation of systolic and diastolic congestive heart failure.  - amiodarone. - sildenafil for pulmonary hypertension.  - as above: torsemide and carvedilol on hold - Dopplers negative for DVT. History DVT in the past.   3.  Anemia with renal failure: hemoglobin 9.4. Normocytic.  - discussed role of EPO and PRBC transfusion with patient.    LOS: [place] 4/27/201910:57 AM

## 2017-10-17 NOTE — Progress Notes (Signed)
[facility] [place] NAME: Debra Saunders    MR#:  [id number]  DATE OF BIRTH:  [birth year]  SUBJECTIVE:  CHIEF COMPLAINT:   Chief Complaint  Patient presents with  . Shortness of Breath  Patient without complaint, awaiting heart catheterization on Monday  REVIEW OF SYSTEMS:  CONSTITUTIONAL: No fever, fatigue or weakness.  EYES: No blurred or double vision.  EARS, NOSE, AND THROAT: No tinnitus or ear pain.  RESPIRATORY: No cough, shortness of breath, wheezing or hemoptysis.  CARDIOVASCULAR: No chest pain, orthopnea, edema.  GASTROINTESTINAL: No nausea, vomiting, diarrhea or abdominal pain.  GENITOURINARY: No dysuria, hematuria.  ENDOCRINE: No polyuria, nocturia,  HEMATOLOGY: No anemia, easy bruising or bleeding SKIN: No rash or lesion. MUSCULOSKELETAL: No joint pain or arthritis.   NEUROLOGIC: No tingling, numbness, weakness.  PSYCHIATRY: No anxiety or depression.   ROS  DRUG ALLERGIES:   Allergies  Allergen Reactions  . Aspirin Hives and Other (See Comments)    Pt states that she is unable to take because she had bleeding in her brain.    . Other Other (See Comments)    Pt states that she is unable to take blood thinners because she had bleeding in her brain.  Blood Thinners-per doctor at [facility]  . Penicillins Hives, Shortness Of Breath, Swelling and Other (See Comments)    Has patient had a PCN reaction causing immediate rash, facial/tongue/throat swelling, SOB or lightheadedness with hypotension: Yes Has patient had a PCN reaction causing severe rash involving mucus membranes or skin necrosis: No Has patient had a PCN reaction that required hospitalization No Has patient had a PCN reaction occurring within the last 10 years: No If all of the above answers are "NO", then may proceed with Cephalosporin use.    VITALS:  Blood pressure (!) 104/58, pulse (!) 59, temperature 98.2 F (36.8 C), temperature source Oral, resp. rate  18, height 5\' 1"  (1.549 m), weight 112.4 kg (247 lb 12.8 oz), SpO2 94 %.  PHYSICAL EXAMINATION:  GENERAL:  72 y.o.-year-old patient lying in the bed with no acute distress.  EYES: Pupils equal, round, reactive to light and accommodation. No scleral icterus. Extraocular muscles intact.  HEENT: Head atraumatic, normocephalic. Oropharynx and nasopharynx clear.  NECK:  Supple, no jugular venous distention. No thyroid enlargement, no tenderness.  LUNGS: Normal breath sounds bilaterally, no wheezing, rales,rhonchi or crepitation. No use of accessory muscles of respiration.  CARDIOVASCULAR: S1, S2 normal. No murmurs, rubs, or gallops.  ABDOMEN: Soft, nontender, nondistended. Bowel sounds present. No organomegaly or mass.  EXTREMITIES: No pedal edema, cyanosis, or clubbing.  NEUROLOGIC: Cranial nerves II through XII are intact. Muscle strength 5/5 in all extremities. Sensation intact. Gait not checked.  PSYCHIATRIC: The patient is alert and oriented x 3.  SKIN: No obvious rash, lesion, or ulcer.   Physical Exam LABORATORY PANEL:   CBC Recent Labs  Lab 10/17/17 0420  WBC 6.2  HGB 9.4*  HCT 29.2*  PLT 164   ------------------------------------------------------------------------------------------------------------------  Chemistries  Recent Labs  Lab 10/17/17 0420  NA 139  K 4.3  CL 105  CO2 29  GLUCOSE 112*  BUN 75*  CREATININE 1.48*  CALCIUM 9.1  MG 1.9   ------------------------------------------------------------------------------------------------------------------  Cardiac Enzymes No results for input(s): TROPONINI in the last 168 hours. ------------------------------------------------------------------------------------------------------------------  RADIOLOGY:  US Venous Img Lower Bilateral  Result Date: 10/16/2017 CLINICAL DATA:  72 year old female with a history of bilateral lower extremity edema and a prior left lower  extremity DVT EXAM: BILATERAL LOWER EXTREMITY  VENOUS DOPPLER ULTRASOUND TECHNIQUE: Gray-scale sonography with graded compression, as well as color Doppler and duplex ultrasound were performed to evaluate the lower extremity deep venous systems from the level of the common femoral vein and including the common femoral, femoral, profunda femoral, popliteal and calf veins including the posterior tibial, peroneal and gastrocnemius veins when visible. The superficial great saphenous vein was also interrogated. Spectral Doppler was utilized to evaluate flow at rest and with distal augmentation maneuvers in the common femoral, femoral and popliteal veins. COMPARISON:  04/05/2014, 08/16/2013, 10/04/2012 FINDINGS: RIGHT LOWER EXTREMITY Common Femoral Vein: Common femoral vein patent with respiratory phasicity maintained and flow maintained. Saphenofemoral Junction: No evidence of thrombus. Normal compressibility and flow on color Doppler imaging. Profunda Femoral Vein: No evidence of thrombus. Normal compressibility and flow on color Doppler imaging. Femoral Vein: No evidence of thrombus. Normal compressibility, respiratory phasicity and response to augmentation. Popliteal Vein: No evidence of thrombus. Normal compressibility, respiratory phasicity and response to augmentation. Calf Veins: Peroneal vein patent with flow maintained and no thrombus. Occlusion of the right posterior tibial veins, of indeterminate chronicity. Superficial Great Saphenous Vein: No evidence of thrombus. Normal compressibility and flow on color Doppler imaging. Other Findings:  None. LEFT LOWER EXTREMITY Common Femoral Vein: Patent common femoral vein with flow maintained and respiratory phasicity maintained. Compressible vein. Saphenofemoral Junction: No evidence of thrombus. Normal compressibility and flow on color Doppler imaging. Profunda Femoral Vein: No evidence of thrombus. Normal compressibility and flow on color Doppler imaging. Femoral Vein: No evidence of thrombus. Normal  compressibility, respiratory phasicity and response to augmentation. Popliteal Vein: No evidence of thrombus. Normal compressibility, respiratory phasicity and response to augmentation. Calf Veins: Peroneal vein patent with flow maintained and compressible. One of the paired posterior tibial veins is occluded, indeterminate chronicity though was occluded on comparison studies of 2015 Superficial Great Saphenous Vein: No evidence of thrombus. Normal compressibility and flow on color Doppler imaging. Other Findings:  None. IMPRESSION: Negative sonographic survey for DVT of the bilateral common femoral vein, femoral vein, popliteal veins. The right-sided posterior tibial veins (calf) are occluded, indeterminate chronicity, and 1 of the paired left posterior tibial veins (calf) is occluded, likely chronic. Signed, Dulcy Fanny. Earleen Newport, DO [facility] Radiology Electronically Signed   By: Corrie Mckusick D.O.   On: 10/16/2017 12:07    ASSESSMENT AND PLAN:  * Acute on chronic respiratory failure Resolved Secondary to acute on chronic diastolic CHF with flash pulmonary edema Continue torsemide On 4 L continuous at home which is her baseline  *Acute renal failure with CKD stage 3 Resolved Did require 2 hemodialysis treatments Nephrology input appreciated  *Acute NSTEMI Stable Compounded by Cardiomyopathy EF 30-35% Treated initially with heparin drip-change to Eliquis twice daily  For heart catheterization on Monday by cardiology   *Chronic atrial fibrillation  Resolved status post cardioversion  Continue amiodarone, Eliquis   *Acute epistaxis  Resolved   *Chronic morbid obesity  Most likely secondary to excess calories  Lifestyle modification recommended   *Acute on COPD exacerbation Resolved  *Hyperlipidemia, unspecified Stable on Crestor  *Chronic diabetes mellitus type 2 Stable on current regiment  *Acute upper respiratory tract  infection Treated with course of antibiotics  *Chronic pulmonary hypertension Stable Continue sildenafil Pulmonary consulted  Palliative care consulted  Disposition pending clinical course   All the records are reviewed and case discussed with Care Management/Social Workerr. Management plans discussed with the patient, family and they are  in agreement.  CODE STATUS: full  TOTAL TIME TAKING CARE OF THIS PATIENT: 35 minutes.     POSSIBLE D/C IN 2-3 DAYS, DEPENDING ON CLINICAL CONDITION.   Debra Saunders M.D on 10/17/2017   Between 7am to 6pm - Pager - [phone]  After 6pm go to [email or web address] - password [email or web address] [facility]  Office  [phone]  CC: Primary care physician; [name], MD  Note: This dictation was prepared with Dragon dictation along with smaller phrase technology. Any transcriptional errors that result from this process are unintentional.

## 2017-10-17 NOTE — Progress Notes (Signed)
Progress Note  Patient Name: Debra Saunders Date of Encounter: 10/17/2017  Primary Cardiologist: [name], MD   Subjective   "I feel better"  Inpatient Medications    Scheduled Meds: . amiodarone  200 mg Oral BID  . budesonide (PULMICORT) nebulizer solution  0.5 mg Nebulization BID  . carvedilol  3.125 mg Oral BID WC  . chlorhexidine  15 mL Mouth Rinse BID  . clopidogrel  75 mg Oral Daily  . gabapentin  300 mg Oral QHS  . hydrALAZINE  10 mg Oral TID  . insulin aspart  0-15 Units Subcutaneous TID WC  . insulin aspart  0-5 Units Subcutaneous QHS  . ipratropium-albuterol  3 mL Nebulization TID  . mouth rinse  15 mL Mouth Rinse q12n4p  . multivitamin with minerals  1 tablet Oral Daily  . mupirocin ointment   Nasal BID  . pantoprazole  40 mg Oral Daily  . rosuvastatin  10 mg Oral Daily  . sildenafil  20 mg Oral TID  . sodium chloride flush  3 mL Intravenous Q12H  . torsemide  20 mg Oral Daily   Continuous Infusions: . sodium chloride Stopped (10/12/17 0830)  . heparin 700 Units/hr (10/17/17 0656)   PRN Meds: sodium chloride, acetaminophen, ALPRAZolam, alum & mag hydroxide-simeth, benzonatate, ipratropium-albuterol, menthol-cetylpyridinium, ondansetron (ZOFRAN) IV, ondansetron, oxyCODONE-acetaminophen, promethazine, sodium chloride flush   Vital Signs    Vitals:   10/16/17 2256 10/17/17 0250 10/17/17 0809 10/17/17 0841  BP:  (!) 108/59 (!) 104/58   Pulse: 61 62 (!) 59   Resp: 18 18 18    Temp:  97.8 F (36.6 C) 98.2 F (36.8 C)   TempSrc:  Oral Oral   SpO2: 97% 100% 97% 94%  Weight:  247 lb 12.8 oz (112.4 kg)    Height:        Intake/Output Summary (Last 24 hours) at 10/17/2017 0905 Last data filed at 10/17/2017 0251 Gross per 24 hour  Intake -  Output 900 ml  Net -900 ml   Filed Weights   10/15/17 0534 10/16/17 0422 10/17/17 0250  Weight: 249 lb 6.4 oz (113.1 kg) 250 lb 9.6 oz (113.7 kg) 247 lb 12.8 oz (112.4 kg)    Telemetry    NSR with atrial  pacing - Personally Reviewed  ECG    none - Personally Reviewed  Physical Exam   GEN: obese 72 yo woman, No acute distress.   Neck: 7 cm JVD Cardiac: RRR, no murmurs, rubs, or gallops.  Respiratory: Clear to auscultation bilaterally. GI: Soft, nontender, non-distended  MS: No edema; No deformity. Neuro:  Nonfocal  Psych: Normal affect   Labs    Chemistry Recent Labs  Lab 10/14/17 1029 10/15/17 0336 10/16/17 0413 10/17/17 0420  NA 135 TEST WILL BE CREDITED  135 137 139  K 4.6 TEST WILL BE CREDITED  4.7 4.2 4.3  CL 101 TEST WILL BE CREDITED  101 103 105  CO2 25 TEST WILL BE CREDITED  26 29 29   GLUCOSE 233* TEST WILL BE CREDITED  247* 148* 112*  BUN 103* TEST WILL BE CREDITED  99* 94* 75*  CREATININE 2.26* TEST WILL BE CREDITED  2.07* 1.70* 1.48*  CALCIUM 9.6 TEST WILL BE CREDITED  9.3 9.0 9.1  ALBUMIN 3.3* 3.2*  --   --   GFRNONAA 20* NOT CALCULATED  22* 28* 34*  GFRAA 23* NOT CALCULATED  26* 33* 39*  ANIONGAP 9 TEST WILL BE CREDITED  8 5 5  Hematology Recent Labs  Lab 10/15/17 0336 10/16/17 0412 10/17/17 0420  WBC 9.3 8.9 6.2  RBC 3.72* 3.47* 3.40*  HGB 10.3* 9.7* 9.4*  HCT 32.1* 29.9* 29.2*  MCV 86.2 86.0 86.0  MCH 27.6 27.9 27.6  MCHC 32.0 32.5 32.0  RDW 17.5* 17.3* 17.2*  PLT 202 167 164    Cardiac EnzymesNo results for input(s): TROPONINI in the last 168 hours. No results for input(s): TROPIPOC in the last 168 hours.   BNPNo results for input(s): BNP, PROBNP in the last 168 hours.   DDimer No results for input(s): DDIMER in the last 168 hours.   Radiology    US Venous Img Lower Bilateral  Result Date: 10/16/2017 CLINICAL DATA:  72 year old female with a history of bilateral lower extremity edema and a prior left lower extremity DVT EXAM: BILATERAL LOWER EXTREMITY VENOUS DOPPLER ULTRASOUND TECHNIQUE: Gray-scale sonography with graded compression, as well as color Doppler and duplex ultrasound were performed to evaluate the lower  extremity deep venous systems from the level of the common femoral vein and including the common femoral, femoral, profunda femoral, popliteal and calf veins including the posterior tibial, peroneal and gastrocnemius veins when visible. The superficial great saphenous vein was also interrogated. Spectral Doppler was utilized to evaluate flow at rest and with distal augmentation maneuvers in the common femoral, femoral and popliteal veins. COMPARISON:  04/05/2014, 08/16/2013, 10/04/2012 FINDINGS: RIGHT LOWER EXTREMITY Common Femoral Vein: Common femoral vein patent with respiratory phasicity maintained and flow maintained. Saphenofemoral Junction: No evidence of thrombus. Normal compressibility and flow on color Doppler imaging. Profunda Femoral Vein: No evidence of thrombus. Normal compressibility and flow on color Doppler imaging. Femoral Vein: No evidence of thrombus. Normal compressibility, respiratory phasicity and response to augmentation. Popliteal Vein: No evidence of thrombus. Normal compressibility, respiratory phasicity and response to augmentation. Calf Veins: Peroneal vein patent with flow maintained and no thrombus. Occlusion of the right posterior tibial veins, of indeterminate chronicity. Superficial Great Saphenous Vein: No evidence of thrombus. Normal compressibility and flow on color Doppler imaging. Other Findings:  None. LEFT LOWER EXTREMITY Common Femoral Vein: Patent common femoral vein with flow maintained and respiratory phasicity maintained. Compressible vein. Saphenofemoral Junction: No evidence of thrombus. Normal compressibility and flow on color Doppler imaging. Profunda Femoral Vein: No evidence of thrombus. Normal compressibility and flow on color Doppler imaging. Femoral Vein: No evidence of thrombus. Normal compressibility, respiratory phasicity and response to augmentation. Popliteal Vein: No evidence of thrombus. Normal compressibility, respiratory phasicity and response to  augmentation. Calf Veins: Peroneal vein patent with flow maintained and compressible. One of the paired posterior tibial veins is occluded, indeterminate chronicity though was occluded on comparison studies of 2015 Superficial Great Saphenous Vein: No evidence of thrombus. Normal compressibility and flow on color Doppler imaging. Other Findings:  None. IMPRESSION: Negative sonographic survey for DVT of the bilateral common femoral vein, femoral vein, popliteal veins. The right-sided posterior tibial veins (calf) are occluded, indeterminate chronicity, and 1 of the paired left posterior tibial veins (calf) is occluded, likely chronic. Signed, Dulcy Fanny. Earleen Newport, DO [facility] Radiology Electronically Signed   By: Corrie Mckusick D.O.   On: 10/16/2017 12:07    Cardiac Studies   Study Conclusions  - Left ventricle: The cavity size was normal. There was mild concentric hypertrophy. Systolic function was mildly to moderately reduced. The estimated ejection fraction was in the range of 40% to 45%. Hypokinesis of the anterior, anteroseptal, and apical myocardium. basal regions best preserved.  The study is not technically sufficient to allow evaluation of LV diastolic function. - Mitral valve: There was moderate regurgitation. - Left atrium: The atrium was moderately dilated. - Right ventricle: The cavity size was moderately dilated. Wall thickness was normal. Systolic function was mildly reduced. - Right atrium: The atrium was moderately dilated. - Tricuspid valve: There was moderate regurgitation. - Pulmonary arteries: Systolic pressure was severely elevated. PA peak pressure: 65 mm Hg (S).    Patient Profile      72 y.o. female with history of nonobstructive CAD, chronic combined systolic and diastolic congestive heart failure, persistent atrial fibrilation, symptomatic bradycardia status post permanent pacemaker, frequent PVCs,  chronic respiratory failure with hypoxia on home O2, COPD, hypertension, hyperlipidemia, obesity, type 2 diabetes mellitus, traumatic subarachnoid hemorrhage in the setting of Xarelto, left lower extremity DVT status post IVC filter, chronic right sided atypical chest pain, chronic left lower extremity lymphedema, prior stroke, sleep apnea, and obesity, who is being seen for the evaluation ofacute on chronic combined heart failure in the setting of persistent atrial fibrillation.    Assessment & Plan    1  Acute on chronic systolic/diasotlic CHF - her volume status appears improved.   2  Persistent atrial fib - ventricular rate is well controlled. She appears to be maintaining NSR with atrial pacing   3  MV disease - minimal murmur on exam today  4  PAH -await right heart cath  5  NSTEM - right and left heart cath planned for Monday as renal function has improved. Will follow creatinine.  For questions or updates, please contact [place] Please consult [email or web address] for contact info under Cardiology/STEMI.      Signed, Cristopher Peru, MD  10/17/2017, 9:05 AM

## 2017-10-17 NOTE — Progress Notes (Addendum)
[place] for heparin Indication: atrial fibrillation  Allergies  Allergen Reactions  . Aspirin Hives and Other (See Comments)    Pt states that she is unable to take because she had bleeding in her brain.    . Other Other (See Comments)    Pt states that she is unable to take blood thinners because she had bleeding in her brain.  Blood Thinners-per doctor at [facility]  . Penicillins Hives, Shortness Of Breath, Swelling and Other (See Comments)    Has patient had a PCN reaction causing immediate rash, facial/tongue/throat swelling, SOB or lightheadedness with hypotension: Yes Has patient had a PCN reaction causing severe rash involving mucus membranes or skin necrosis: No Has patient had a PCN reaction that required hospitalization No Has patient had a PCN reaction occurring within the last 10 years: No If all of the above answers are "NO", then may proceed with Cephalosporin use.    Patient Measurements: Height: 5\' 1"  (154.9 cm) Weight: 247 lb 12.8 oz (112.4 kg) IBW/kg (Calculated) : 47.8 Heparin Dosing Weight: 74 kg  Vital Signs: Temp: 98.2 F (36.8 C) (04/27 0809) Temp Source: Oral (04/27 0809) BP: 104/58 (04/27 0809) Pulse Rate: 59 (04/27 0809)  Labs: Recent Labs    10/15/17 0336 10/16/17 0412 10/16/17 0413 10/17/17 0420 10/17/17 1214  HGB 10.3* 9.7*  --  9.4*  --   HCT 32.1* 29.9*  --  29.2*  --   PLT 202 167  --  164  --   HEPARINUNFRC 0.61 0.54  --  0.98* 1.04*  CREATININE TEST WILL BE CREDITED  2.07*  --  1.70* 1.48*  --     Estimated Creatinine Clearance: 38.7 mL/min (A) (by C-G formula based on SCr of 1.48 mg/dL (H)).   Medical History: Past Medical History:  Diagnosis Date  . Arthritis   . Atrial fibrillation, persistent ([place])    a. s/p TEE-guided DCCV on 08/20/2017  . Chest pain    a. 01/2012 Cath: nonobs dzs;  c. 04/2014 MV: No ischemia.  . Chronic back pain   . Chronic combined systolic (congestive) and  diastolic (congestive) heart failure ([place])    a. Dx 2005 @ [facility];  b. 02/2014 Echo: Ef 55-60%, no rwma, mild MR, PASP 33mmHg. c. 07/2017: EF 30-35%, diffuse HK, trivial AI, severe MR, moderate TR  . Chronic respiratory failure (HCC)    a. on home O2  . COPD (chronic obstructive pulmonary disease) ([place])    a. Home O2 - 3lpm  . Gastritis   . History of intracranial hemorrhage    a. 6/14 described by neurosurgery as "small frontal posttraumatic SAH " - pt was on xarelto @ the time.  . Hyperlipidemia   . Hypertension   . Lipoma of back   . Lymphedema    a. Chronic LLE edema.  . Obesity   . Sepsis with metabolic encephalopathy ([place])   . Sleep apnea   . Streptococcal bacteremia   . Stroke ([place])   . Symptomatic bradycardia    a. s/p BSX PPM.  . Thyroid nodule   . Type II diabetes mellitus ([place])   . Urinary incontinence     Medications:  Patient takes apixaban 5mg  BID at home for afib. She was initially started on apixaban 5mg  BID when she was admitted, but her dose was changed to apixaban 2.5mg  BID on 4/14 due to her increase in Scr (1.4 --> 3.1). Cardiology stated that decrease in apixaban dose was  for instability of patient's renal function.   Assessment: Pharmacy consulted for heparin drip management for 72 yo female transitioned from apixaban to heparin secondary to decline in renal function. Patient with past medical history significant for atrial fibrillation and heart failure. Patient currently receiving heparin at 850 units/hr.  Goal of Therapy:  Heparin level 0.3-0.7 units/ml  APTT 66-102s Monitor platelets by anticoagulation protocol: Yes   Plan:  Will continue heparin infusion at 850 units/hr and recheck in AM.   04/24 @ 0300 HL 0.56 therapeutic. Will continue current rate and will recheck HL/CBC w/ am labs.  04/25 @ 0330 HL 0.61 therapeutic. Will continue current rate and will recheck w/ am labs.  04/26 @ 0400 HL 0.54 therapeutic. Will continue current rate and will  recheck w/ am labs.  04/27 @ 0420 HL 0.98 supratherapeutic. Will decrease rate to 700 units/hr and will recheck @ 1200. CBC trending down will continue to monitor.  4/27@1410  HL 1.04, hold 1 hour decrease heparin iv to heparin 450units/hr and recheck in 6 hours     Thomasenia Sales, PharmD, MBA, BCGP Clinical Pharmacist  10/17/2017

## 2017-10-18 DIAGNOSIS — I5041 Acute combined systolic (congestive) and diastolic (congestive) heart failure: Secondary | ICD-10-CM

## 2017-10-18 LAB — GLUCOSE, CAPILLARY
Glucose-Capillary: 116 mg/dL — ABNORMAL HIGH (ref 65–99)
Glucose-Capillary: 224 mg/dL — ABNORMAL HIGH (ref 65–99)
Glucose-Capillary: 152 mg/dL — ABNORMAL HIGH (ref 65–99)
Glucose-Capillary: 149 mg/dL — ABNORMAL HIGH (ref 65–99)

## 2017-10-18 LAB — CBC
HCT: 28.6 % — ABNORMAL LOW (ref 35.0–47.0)
Hemoglobin: 9.3 g/dL — ABNORMAL LOW (ref 12.0–16.0)
MCH: 28.1 pg (ref 26.0–34.0)
MCHC: 32.4 g/dL (ref 32.0–36.0)
MCV: 86.6 fL (ref 80.0–100.0)
Platelets: 136 10*3/uL — ABNORMAL LOW (ref 150–440)
RBC: 3.3 MIL/uL — ABNORMAL LOW (ref 3.80–5.20)
RDW: 17.3 % — ABNORMAL HIGH (ref 11.5–14.5)
WBC: 4.6 10*3/uL (ref 3.6–11.0)

## 2017-10-18 NOTE — Clinical Social Work Note (Signed)
Clinical Social Work Assessment  Patient Details  Name: Debra Saunders MRN: [id number] Date of Birth: 05/27/1946  Date of referral:  10/18/17               Reason for consult:  Facility Placement                Permission sought to share information with:  Chartered certified accountant granted to share information::  Yes, Verbal Permission Granted  Name::        Agency::  [facility] area SNFs  Relationship::     Contact Information:     Housing/Transportation Living arrangements for the past 2 months:  [place] of Information:  Medical Team, Adult Children Patient Interpreter Needed:  None Criminal Activity/Legal Involvement Pertinent to Current Situation/Hospitalization:  No - Comment as needed Significant Relationships:  Adult Children, Warehouse manager, [place] Lives with:  Self Do you feel safe going back to the place where you live?  Yes Need for family participation in patient care:  No (Coment)  Care giving concerns:  PT recommendation for SNF   Social Worker assessment / plan:  The CSW attempted to visit the patient at bedside; however, the patient was sleeping soundly. The CSW contacted the patient's daughter, [name], who gave verbal permission to begin a bed search and indicated that [facility] is the preference as her mother was there in March for short term rehab. The family is aware that [facility] will need to give prior authorization for SNF.   At baseline, the patient lives alone and has chronic oxygen at 4L with CPAP at night. The family and patient are aware from her past admission that she will need to bring her CPAP and portable tank to the facility when discharged. CSW will follow-up with bed offers as available and facilitate discharge when the patient is medically stable. The family has concerns about discharge immediately after her cardiac catheterization tomorrow and hope that discharge will take place on Tuesday.  Employment status:   Retired Nurse, adult PT Recommendations:  [place] / Referral to community resources:  [place]  Patient/Family's Response to care:  The patient's daughter thanked the CSW.  Patient/Family's Understanding of and Emotional Response to Diagnosis, Current Treatment, and Prognosis:  The patient's family understand and agree with the current discharge plan.  Emotional Assessment Appearance:  Appears stated age Attitude/Demeanor/Rapport:  (patient was sleeping) Affect (typically observed):  (patient was sleeping) Orientation:  (Baseline X4; patient was sleeping) Alcohol / Substance use:  Never Used Psych involvement (Current and /or in the community):  No (Comment)  Discharge Needs  Concerns to be addressed:  Care Coordination, Discharge Planning Concerns Readmission within the last 30 days:  Yes Current discharge risk:  Chronically ill Barriers to Discharge:  Continued Medical Work up   [employer], LCSW 10/18/2017, 3:53 PM

## 2017-10-18 NOTE — Progress Notes (Signed)
[facility]  ROUNDING NOTE   Subjective:   Holding torsemide.   No creatinine for today.   Objective:  Vital signs in last 24 hours:  Temp:  [97.8 F (36.6 C)-99.2 F (37.3 C)] 99.2 F (37.3 C) (04/28 0832) Pulse Rate:  [59-65] 60 (04/28 0832) Resp:  [16-23] 19 (04/28 0832) BP: (108-124)/(55-64) 123/59 (04/28 0832) SpO2:  [97 %-99 %] 98 % (04/28 0832) Weight:  [110.5 kg (243 lb 8 oz)] 110.5 kg (243 lb 8 oz) (04/28 0400)  Weight change: -1.95 kg (-4 lb 4.8 oz) Filed Weights   10/16/17 0422 10/17/17 0250 10/18/17 0400  Weight: 113.7 kg (250 lb 9.6 oz) 112.4 kg (247 lb 12.8 oz) 110.5 kg (243 lb 8 oz)    Intake/Output: I/O last 3 completed shifts: In: 257 [I.V.:257] Out: 1300 [Urine:1300]   Intake/Output this shift:  No intake/output data recorded.  Physical Exam: General:  No acute distress  Head: Normocephalic, atraumatic.   Eyes: Anicteric  Neck: Supple, trachea midline  Lungs:  Scattered rhonchi, normal effort  Heart: S1S2 no rubs  Abdomen:  Soft, nontender, bowel sounds present  Extremities: 1+peripheral edema. Left > right  Neurologic: Awake, alert, following commands  Skin: No lesions       Basic Metabolic Panel: Recent Labs  Lab 10/13/17 0524 10/14/17 1029 10/15/17 0336 10/16/17 0413 10/17/17 0420  NA 135 135 TEST WILL BE CREDITED  135 137 139  K 4.3 4.6 TEST WILL BE CREDITED  4.7 4.2 4.3  CL 101 101 TEST WILL BE CREDITED  101 103 105  CO2 27 25 TEST WILL BE CREDITED  26 29 29   GLUCOSE 235* 233* TEST WILL BE CREDITED  247* 148* 112*  BUN 102* 103* TEST WILL BE CREDITED  99* 94* 75*  CREATININE 2.70* 2.26* TEST WILL BE CREDITED  2.07* 1.70* 1.48*  CALCIUM 9.4 9.6 TEST WILL BE CREDITED  9.3 9.0 9.1  MG  --   --   --   --  1.9  PHOS  --  5.6* 5.0*  --   --     Liver Function Tests: Recent Labs  Lab 10/14/17 1029 10/15/17 0336  ALBUMIN 3.3* 3.2*   No results for input(s): LIPASE, AMYLASE in the last 168 hours. No  results for input(s): AMMONIA in the last 168 hours.  CBC: Recent Labs  Lab 10/14/17 0336 10/15/17 0336 10/16/17 0412 10/17/17 0420 10/18/17 0424  WBC 9.2 9.3 8.9 6.2 4.6  HGB 10.1* 10.3* 9.7* 9.4* 9.3*  HCT 31.2* 32.1* 29.9* 29.2* 28.6*  MCV 86.0 86.2 86.0 86.0 86.6  PLT 205 202 167 164 136*    Cardiac Enzymes: No results for input(s): CKTOTAL, CKMB, CKMBINDEX, TROPONINI in the last 168 hours.  BNP: Invalid input(s): POCBNP  CBG: Recent Labs  Lab 10/17/17 1122 10/17/17 1724 10/17/17 2053 10/18/17 0728 10/18/17 1134  GLUCAP 139* 266* 172* 116* 224*    Microbiology: Results for orders placed or performed during the hospital encounter of 09/30/17  MRSA PCR Screening     Status: Abnormal   Collection Time: 10/01/17  9:37 AM  Result Value Ref Range Status   MRSA by PCR POSITIVE (A) NEGATIVE Final    Comment:        [name] (FDA approved for NASAL specimens only), is one component of a comprehensive MRSA colonization surveillance program. It is not intended to diagnose MRSA infection nor to guide or monitor treatment for MRSA infections. RESULT CALLED TO, READ BACK BY AND  VERIFIED WITH:  Gildardo Pounds AT 1207 10/01/17 SDR Performed at [facility], [address] [place], [place] [address]     Coagulation Studies: No results for input(s): LABPROT, INR in the last 72 hours.  Urinalysis: No results for input(s): COLORURINE, LABSPEC, PHURINE, GLUCOSEU, HGBUR, BILIRUBINUR, KETONESUR, PROTEINUR, UROBILINOGEN, NITRITE, LEUKOCYTESUR in the last 72 hours.  Invalid input(s): APPERANCEUR    Imaging: US Venous Img Lower Bilateral  Result Date: 10/16/2017 CLINICAL DATA:  72 year old female with a history of bilateral lower extremity edema and a prior left lower extremity DVT EXAM: BILATERAL LOWER EXTREMITY VENOUS DOPPLER ULTRASOUND TECHNIQUE: Gray-scale sonography with graded compression, as well as color Doppler and duplex ultrasound  were performed to evaluate the lower extremity deep venous systems from the level of the common femoral vein and including the common femoral, femoral, profunda femoral, popliteal and calf veins including the posterior tibial, peroneal and gastrocnemius veins when visible. The superficial great saphenous vein was also interrogated. Spectral Doppler was utilized to evaluate flow at rest and with distal augmentation maneuvers in the common femoral, femoral and popliteal veins. COMPARISON:  04/05/2014, 08/16/2013, 10/04/2012 FINDINGS: RIGHT LOWER EXTREMITY Common Femoral Vein: Common femoral vein patent with respiratory phasicity maintained and flow maintained. Saphenofemoral Junction: No evidence of thrombus. Normal compressibility and flow on color Doppler imaging. Profunda Femoral Vein: No evidence of thrombus. Normal compressibility and flow on color Doppler imaging. Femoral Vein: No evidence of thrombus. Normal compressibility, respiratory phasicity and response to augmentation. Popliteal Vein: No evidence of thrombus. Normal compressibility, respiratory phasicity and response to augmentation. Calf Veins: Peroneal vein patent with flow maintained and no thrombus. Occlusion of the right posterior tibial veins, of indeterminate chronicity. Superficial Great Saphenous Vein: No evidence of thrombus. Normal compressibility and flow on color Doppler imaging. Other Findings:  None. LEFT LOWER EXTREMITY Common Femoral Vein: Patent common femoral vein with flow maintained and respiratory phasicity maintained. Compressible vein. Saphenofemoral Junction: No evidence of thrombus. Normal compressibility and flow on color Doppler imaging. Profunda Femoral Vein: No evidence of thrombus. Normal compressibility and flow on color Doppler imaging. Femoral Vein: No evidence of thrombus. Normal compressibility, respiratory phasicity and response to augmentation. Popliteal Vein: No evidence of thrombus. Normal compressibility,  respiratory phasicity and response to augmentation. Calf Veins: Peroneal vein patent with flow maintained and compressible. One of the paired posterior tibial veins is occluded, indeterminate chronicity though was occluded on comparison studies of 2015 Superficial Great Saphenous Vein: No evidence of thrombus. Normal compressibility and flow on color Doppler imaging. Other Findings:  None. IMPRESSION: Negative sonographic survey for DVT of the bilateral common femoral vein, femoral vein, popliteal veins. The right-sided posterior tibial veins (calf) are occluded, indeterminate chronicity, and 1 of the paired left posterior tibial veins (calf) is occluded, likely chronic. Signed, Dulcy Fanny. Earleen Newport, DO [facility] Electronically Signed   By: Corrie Mckusick D.O.   On: 10/16/2017 12:07     Medications:   . sodium chloride Stopped (10/12/17 0830)   . amiodarone  200 mg Oral BID  . budesonide (PULMICORT) nebulizer solution  0.5 mg Nebulization BID  . carvedilol  3.125 mg Oral BID WC  . chlorhexidine  15 mL Mouth Rinse BID  . clopidogrel  75 mg Oral Daily  . gabapentin  300 mg Oral QHS  . insulin aspart  0-15 Units Subcutaneous TID WC  . insulin aspart  0-5 Units Subcutaneous QHS  . ipratropium-albuterol  3 mL Nebulization TID  . mouth rinse  15 mL Mouth Rinse q12n4p  . multivitamin with minerals  1 tablet Oral Daily  . mupirocin ointment   Nasal BID  . pantoprazole  40 mg Oral Daily  . rosuvastatin  10 mg Oral Daily  . sildenafil  20 mg Oral TID  . sodium chloride flush  3 mL Intravenous Q12H   sodium chloride, acetaminophen, ALPRAZolam, alum & mag hydroxide-simeth, benzonatate, ipratropium-albuterol, menthol-cetylpyridinium, ondansetron (ZOFRAN) IV, ondansetron, oxyCODONE-acetaminophen, promethazine, sodium chloride flush  Assessment/ Plan:  72 y.o.black female with a PMHx of chronic diastolic heart failure, atrial fibrillation, chronic  hypercarbic respiratory failure, COPD, morbid obesity, hyperlipidemia, diabetes mellitus type 2, history of intracranial hemorrhage who was admitted to [facility] on 09/30/2017   1.  Acute renal failure: Acute renal failure secondary to acute cardiorenal syndrome and overdiuresis  Required two treatment of hemodialysis on 4/16 and 4/17. No acute indication for dialysis. Dialysis catheter has been removed.  Creatinine and BUN improving. Baseline creatinine of 0.83, GFR wnl, on 08/28/17. History of acute renal failure in 2014.  - Continue to monitor urine output.  - Holding torsemide    2. Hypertension with atrial fibrillation: status post cardioversion on 4/22. Hypotensive this morning. Holding home medications.  Acute exacerbation of systolic and diastolic congestive heart failure.  - amiodarone. - Carvedilol - sildenafil for pulmonary hypertension.  - as above: torsemide on hold - Dopplers negative for DVT. History DVT in the past.  - Cardiac catheterization for tomorrow 4/29.   3.  Anemia with renal failure: hemoglobin 9.3. Normocytic.    LOS: 18 Debra Saunders 4/28/201911:45 AM

## 2017-10-18 NOTE — Progress Notes (Signed)
[facility] NAME: Debra Saunders    MR#:  [id number]  DATE OF BIRTH:  05/30/1946  SUBJECTIVE:  CHIEF COMPLAINT:   Chief Complaint  Patient presents with  . Shortness of Breath  Patient without complaint, for left and right heart catheterization on tomorrow  REVIEW OF SYSTEMS:  CONSTITUTIONAL: No fever, fatigue or weakness.  EYES: No blurred or double vision.  EARS, NOSE, AND THROAT: No tinnitus or ear pain.  RESPIRATORY: No cough, shortness of breath, wheezing or hemoptysis.  CARDIOVASCULAR: No chest pain, orthopnea, edema.  GASTROINTESTINAL: No nausea, vomiting, diarrhea or abdominal pain.  GENITOURINARY: No dysuria, hematuria.  ENDOCRINE: No polyuria, nocturia,  HEMATOLOGY: No anemia, easy bruising or bleeding SKIN: No rash or lesion. MUSCULOSKELETAL: No joint pain or arthritis.   NEUROLOGIC: No tingling, numbness, weakness.  PSYCHIATRY: No anxiety or depression.   ROS  DRUG ALLERGIES:   Allergies  Allergen Reactions  . Aspirin Hives and Other (See Comments)    Pt states that she is unable to take because she had bleeding in her brain.    . Other Other (See Comments)    Pt states that she is unable to take blood thinners because she had bleeding in her brain.  Blood Thinners-per doctor at [facility]  . Penicillins Hives, Shortness Of Breath, Swelling and Other (See Comments)    Has patient had a PCN reaction causing immediate rash, facial/tongue/throat swelling, SOB or lightheadedness with hypotension: Yes Has patient had a PCN reaction causing severe rash involving mucus membranes or skin necrosis: No Has patient had a PCN reaction that required hospitalization No Has patient had a PCN reaction occurring within the last 10 years: No If all of the above answers are "NO", then may proceed with Cephalosporin use.    VITALS:  Blood pressure (!) 123/59, pulse 60, temperature 99.2 F (37.3 C), temperature source Oral,  resp. rate 19, height 5\' 1"  (1.549 m), weight 110.5 kg (243 lb 8 oz), SpO2 98 %.  PHYSICAL EXAMINATION:  GENERAL:  72 y.o.-year-old patient lying in the bed with no acute distress.  EYES: Pupils equal, round, reactive to light and accommodation. No scleral icterus. Extraocular muscles intact.  HEENT: Head atraumatic, normocephalic. Oropharynx and nasopharynx clear.  NECK:  Supple, no jugular venous distention. No thyroid enlargement, no tenderness.  LUNGS: Normal breath sounds bilaterally, no wheezing, rales,rhonchi or crepitation. No use of accessory muscles of respiration.  CARDIOVASCULAR: S1, S2 normal. No murmurs, rubs, or gallops.  ABDOMEN: Soft, nontender, nondistended. Bowel sounds present. No organomegaly or mass.  EXTREMITIES: No pedal edema, cyanosis, or clubbing.  NEUROLOGIC: Cranial nerves II through XII are intact. Muscle strength 5/5 in all extremities. Sensation intact. Gait not checked.  PSYCHIATRIC: The patient is alert and oriented x 3.  SKIN: No obvious rash, lesion, or ulcer.   Physical Exam LABORATORY PANEL:   CBC Recent Labs  Lab 10/18/17 0424  WBC 4.6  HGB 9.3*  HCT 28.6*  PLT 136*   ------------------------------------------------------------------------------------------------------------------  Chemistries  Recent Labs  Lab 10/17/17 0420  NA 139  K 4.3  CL 105  CO2 29  GLUCOSE 112*  BUN 75*  CREATININE 1.48*  CALCIUM 9.1  MG 1.9   ------------------------------------------------------------------------------------------------------------------  Cardiac Enzymes No results for input(s): TROPONINI in the last 168 hours. ------------------------------------------------------------------------------------------------------------------  RADIOLOGY:  US Venous Img Lower Bilateral  Result Date: 10/16/2017 CLINICAL DATA:  72 year old female with a history of bilateral lower extremity edema and a prior  left lower extremity DVT EXAM: BILATERAL LOWER  EXTREMITY VENOUS DOPPLER ULTRASOUND TECHNIQUE: Gray-scale sonography with graded compression, as well as color Doppler and duplex ultrasound were performed to evaluate the lower extremity deep venous systems from the level of the common femoral vein and including the common femoral, femoral, profunda femoral, popliteal and calf veins including the posterior tibial, peroneal and gastrocnemius veins when visible. The superficial great saphenous vein was also interrogated. Spectral Doppler was utilized to evaluate flow at rest and with distal augmentation maneuvers in the common femoral, femoral and popliteal veins. COMPARISON:  04/05/2014, 08/16/2013, 10/04/2012 FINDINGS: RIGHT LOWER EXTREMITY Common Femoral Vein: Common femoral vein patent with respiratory phasicity maintained and flow maintained. Saphenofemoral Junction: No evidence of thrombus. Normal compressibility and flow on color Doppler imaging. Profunda Femoral Vein: No evidence of thrombus. Normal compressibility and flow on color Doppler imaging. Femoral Vein: No evidence of thrombus. Normal compressibility, respiratory phasicity and response to augmentation. Popliteal Vein: No evidence of thrombus. Normal compressibility, respiratory phasicity and response to augmentation. Calf Veins: Peroneal vein patent with flow maintained and no thrombus. Occlusion of the right posterior tibial veins, of indeterminate chronicity. Superficial Great Saphenous Vein: No evidence of thrombus. Normal compressibility and flow on color Doppler imaging. Other Findings:  None. LEFT LOWER EXTREMITY Common Femoral Vein: Patent common femoral vein with flow maintained and respiratory phasicity maintained. Compressible vein. Saphenofemoral Junction: No evidence of thrombus. Normal compressibility and flow on color Doppler imaging. Profunda Femoral Vein: No evidence of thrombus. Normal compressibility and flow on color Doppler imaging. Femoral Vein: No evidence of thrombus. Normal  compressibility, respiratory phasicity and response to augmentation. Popliteal Vein: No evidence of thrombus. Normal compressibility, respiratory phasicity and response to augmentation. Calf Veins: Peroneal vein patent with flow maintained and compressible. One of the paired posterior tibial veins is occluded, indeterminate chronicity though was occluded on comparison studies of 2015 Superficial Great Saphenous Vein: No evidence of thrombus. Normal compressibility and flow on color Doppler imaging. Other Findings:  None. IMPRESSION: Negative sonographic survey for DVT of the bilateral common femoral vein, femoral vein, popliteal veins. The right-sided posterior tibial veins (calf) are occluded, indeterminate chronicity, and 1 of the paired left posterior tibial veins (calf) is occluded, likely chronic. Signed, Dulcy Fanny. Earleen Newport, DO [facility] Electronically Signed   By: Corrie Mckusick D.O.   On: 10/16/2017 12:07    ASSESSMENT AND PLAN:  * Acute on chronic respiratory failure Resolved Secondary to acute on chronic diastolic CHF with flash pulmonary edema Continue torsemide On 4 L continuous at home which is her baseline  *Acute NSTEMI Stable Compounded by Cardiomyopathy EF 30-35% Treated initially with heparin drip-changed to Eliquis twice daily October 17, 2017 For left and right heart catheterization on tomorrow by cardiology   *Acute renal failure with CKD stage 3 Resolved Did require 2 hemodialysis treatments Nephrology input appreciated  *Chronic atrial fibrillation  Resolved s/p cardioversion  Continue amiodarone, Eliquis   *Acute epistaxis  Resolved   *Chronic morbid obesity  Most likely secondary to excess calories  Lifestyle modification recommended   *Acute on COPD exacerbation Resolved  *Hyperlipidemia, unspecified Stable on Crestor  *Chronic diabetes mellitus type 2 Stable on current regiment  *Acute  upper respiratory tract infection Treated with course of antibiotics  *Chronic pulmonary hypertension Stable Continue sildenafil Pulmonary consulted  Palliative care consulted  Disposition pending clinical course    All the records are reviewed and case discussed with Care Management/Social Workerr. Management plans discussed  with the patient, family and they are in agreement.  CODE STATUS: full  TOTAL TIME TAKING CARE OF THIS PATIENT: 35 minutes.     POSSIBLE D/C IN 1-2 DAYS, DEPENDING ON CLINICAL CONDITION.   Avel Peace Alondra Vandeven M.D on 10/18/2017   Between 7am to 6pm - Pager - [phone]  After 6pm go to [email or web address] - password [email or web address] [facility]  Office  [phone]  CC: Primary care physician; [name], MD  Note: This dictation was prepared with Dragon dictation along with smaller phrase technology. Any transcriptional errors that result from this process are unintentional.

## 2017-10-18 NOTE — H&P (View-Only) (Signed)
Progress Note  Patient Name: Debra Saunders Date of Encounter: 10/18/2017  Primary Cardiologist: [name], MD   Subjective  Still some cough but no chest pain or sob.   Inpatient Medications    Scheduled Meds: . amiodarone  200 mg Oral BID  . apixaban  2.5 mg Oral BID  . budesonide (PULMICORT) nebulizer solution  0.5 mg Nebulization BID  . carvedilol  3.125 mg Oral BID WC  . chlorhexidine  15 mL Mouth Rinse BID  . clopidogrel  75 mg Oral Daily  . gabapentin  300 mg Oral QHS  . insulin aspart  0-15 Units Subcutaneous TID WC  . insulin aspart  0-5 Units Subcutaneous QHS  . ipratropium-albuterol  3 mL Nebulization TID  . mouth rinse  15 mL Mouth Rinse q12n4p  . multivitamin with minerals  1 tablet Oral Daily  . mupirocin ointment   Nasal BID  . pantoprazole  40 mg Oral Daily  . rosuvastatin  10 mg Oral Daily  . sildenafil  20 mg Oral TID  . sodium chloride flush  3 mL Intravenous Q12H  . torsemide  20 mg Oral Daily   Continuous Infusions: . sodium chloride Stopped (10/12/17 0830)   PRN Meds: sodium chloride, acetaminophen, ALPRAZolam, alum & mag hydroxide-simeth, benzonatate, ipratropium-albuterol, menthol-cetylpyridinium, ondansetron (ZOFRAN) IV, ondansetron, oxyCODONE-acetaminophen, promethazine, sodium chloride flush   Vital Signs    Vitals:   10/17/17 2327 10/18/17 0400 10/18/17 0825 10/18/17 0832  BP:  (!) 108/57  (!) 123/59  Pulse: 65 60 (!) 59 60  Resp: 18 16 18 19   Temp:  98 F (36.7 C)  99.2 F (37.3 C)  TempSrc:  Oral  Oral  SpO2: 98% 97% 98% 98%  Weight:  243 lb 8 oz (110.5 kg)    Height:        Intake/Output Summary (Last 24 hours) at 10/18/2017 0945 Last data filed at 10/18/2017 0400 Gross per 24 hour  Intake 256.98 ml  Output 400 ml  Net -143.02 ml   Filed Weights   10/16/17 0422 10/17/17 0250 10/18/17 0400  Weight: 250 lb 9.6 oz (113.7 kg) 247 lb 12.8 oz (112.4 kg) 243 lb 8 oz (110.5 kg)    Telemetry     nsr - Personally  Reviewed  ECG    none - Personally Reviewed  Physical Exam   GEN: obese 72 yo woman, No acute distress.   Neck: 6 cm JVD Cardiac: RRR, no murmurs, rubs, or gallops.  Respiratory: Clear to auscultation bilaterally. GI: Soft, nontender, non-distended  MS: No edema; No deformity. Neuro:  Nonfocal  Psych: Normal affect   Labs    Chemistry Recent Labs  Lab 10/14/17 1029 10/15/17 0336 10/16/17 0413 10/17/17 0420  NA 135 TEST WILL BE CREDITED  135 137 139  K 4.6 TEST WILL BE CREDITED  4.7 4.2 4.3  CL 101 TEST WILL BE CREDITED  101 103 105  CO2 25 TEST WILL BE CREDITED  26 29 29   GLUCOSE 233* TEST WILL BE CREDITED  247* 148* 112*  BUN 103* TEST WILL BE CREDITED  99* 94* 75*  CREATININE 2.26* TEST WILL BE CREDITED  2.07* 1.70* 1.48*  CALCIUM 9.6 TEST WILL BE CREDITED  9.3 9.0 9.1  ALBUMIN 3.3* 3.2*  --   --   GFRNONAA 20* NOT CALCULATED  22* 28* 34*  GFRAA 23* NOT CALCULATED  26* 33* 39*  ANIONGAP 9 TEST WILL BE CREDITED  8 5 5      Hematology Recent  Labs  Lab 10/16/17 0412 10/17/17 0420 10/18/17 0424  WBC 8.9 6.2 4.6  RBC 3.47* 3.40* 3.30*  HGB 9.7* 9.4* 9.3*  HCT 29.9* 29.2* 28.6*  MCV 86.0 86.0 86.6  MCH 27.9 27.6 28.1  MCHC 32.5 32.0 32.4  RDW 17.3* 17.2* 17.3*  PLT 167 164 136*    Cardiac EnzymesNo results for input(s): TROPONINI in the last 168 hours. No results for input(s): TROPIPOC in the last 168 hours.   BNPNo results for input(s): BNP, PROBNP in the last 168 hours.   DDimer No results for input(s): DDIMER in the last 168 hours.   Radiology    US Venous Img Lower Bilateral  Result Date: 10/16/2017 CLINICAL DATA:  73 year old female with a history of bilateral lower extremity edema and a prior left lower extremity DVT EXAM: BILATERAL LOWER EXTREMITY VENOUS DOPPLER ULTRASOUND TECHNIQUE: Gray-scale sonography with graded compression, as well as color Doppler and duplex ultrasound were performed to evaluate the lower extremity deep venous  systems from the level of the common femoral vein and including the common femoral, femoral, profunda femoral, popliteal and calf veins including the posterior tibial, peroneal and gastrocnemius veins when visible. The superficial great saphenous vein was also interrogated. Spectral Doppler was utilized to evaluate flow at rest and with distal augmentation maneuvers in the common femoral, femoral and popliteal veins. COMPARISON:  04/05/2014, 08/16/2013, 10/04/2012 FINDINGS: RIGHT LOWER EXTREMITY Common Femoral Vein: Common femoral vein patent with respiratory phasicity maintained and flow maintained. Saphenofemoral Junction: No evidence of thrombus. Normal compressibility and flow on color Doppler imaging. Profunda Femoral Vein: No evidence of thrombus. Normal compressibility and flow on color Doppler imaging. Femoral Vein: No evidence of thrombus. Normal compressibility, respiratory phasicity and response to augmentation. Popliteal Vein: No evidence of thrombus. Normal compressibility, respiratory phasicity and response to augmentation. Calf Veins: Peroneal vein patent with flow maintained and no thrombus. Occlusion of the right posterior tibial veins, of indeterminate chronicity. Superficial Great Saphenous Vein: No evidence of thrombus. Normal compressibility and flow on color Doppler imaging. Other Findings:  None. LEFT LOWER EXTREMITY Common Femoral Vein: Patent common femoral vein with flow maintained and respiratory phasicity maintained. Compressible vein. Saphenofemoral Junction: No evidence of thrombus. Normal compressibility and flow on color Doppler imaging. Profunda Femoral Vein: No evidence of thrombus. Normal compressibility and flow on color Doppler imaging. Femoral Vein: No evidence of thrombus. Normal compressibility, respiratory phasicity and response to augmentation. Popliteal Vein: No evidence of thrombus. Normal compressibility, respiratory phasicity and response to augmentation. Calf Veins:  Peroneal vein patent with flow maintained and compressible. One of the paired posterior tibial veins is occluded, indeterminate chronicity though was occluded on comparison studies of 2015 Superficial Great Saphenous Vein: No evidence of thrombus. Normal compressibility and flow on color Doppler imaging. Other Findings:  None. IMPRESSION: Negative sonographic survey for DVT of the bilateral common femoral vein, femoral vein, popliteal veins. The right-sided posterior tibial veins (calf) are occluded, indeterminate chronicity, and 1 of the paired left posterior tibial veins (calf) is occluded, likely chronic. Signed, Dulcy Fanny. Earleen Newport, DO [facility] Radiology Electronically Signed   By: Corrie Mckusick D.O.   On: 10/16/2017 12:07    Cardiac Studies   Study Conclusions  - Left ventricle: The cavity size was normal. There was mild concentric hypertrophy. Systolic function was mildly to moderately reduced. The estimated ejection fraction was in the range of 40% to 45%. Hypokinesis of the anterior, anteroseptal, and apical myocardium. basal regions best preserved. The study  is not technically sufficient to allow evaluation of LV diastolic function. - Mitral valve: There was moderate regurgitation. - Left atrium: The atrium was moderately dilated. - Right ventricle: The cavity size was moderately dilated. Wall thickness was normal. Systolic function was mildly reduced. - Right atrium: The atrium was moderately dilated. - Tricuspid valve: There was moderate regurgitation. - Pulmonary arteries: Systolic pressure was severely elevated. PA peak pressure: 65 mm Hg (S).    Patient Profile      72 y.o. female with history of nonobstructive CAD, chronic combined systolic and diastolic congestive heart failure, persistent atrial fibrilation, symptomatic bradycardia status post permanent pacemaker, frequent PVCs, chronic respiratory failure  with hypoxia on home O2, COPD, hypertension, hyperlipidemia, obesity, type 2 diabetes mellitus, traumatic subarachnoid hemorrhage in the setting of Xarelto, left lower extremity DVT status post IVC filter, chronic right sided atypical chest pain, chronic left lower extremity lymphedema, prior stroke, sleep apnea, and obesity, who is being seen for the evaluation ofacute on chronic combined heart failure in the setting of persistent atrial fibrillation.    Assessment & Plan    1  Acute on chronic systolic/diasotlic CHF - her symptoms appear back to baseline. She will undergo right and left heart cath tomorrow.    2  Persistent atrial fib - she appears to be maintaining NSR  3  MV disease   4  PAH -await right heart cath  5  NSTEM - right and left heart cath planned for Monday as renal function has improved. Will follow creatinine. Orders have been written  For questions or updates, please contact [facility] Please consult [email or web address] for contact info under Cardiology/STEMI.      Signed, Cristopher Peru, MD  10/18/2017, 9:45 AM

## 2017-10-18 NOTE — NC FL2 (Signed)
[place] LEVEL OF CARE SCREENING TOOL     IDENTIFICATION  Patient Name: Debra Saunders Birthdate: [birth year] Sex: female Admission Date (Current Location): 09/30/2017  [place] and [place] Number:  Engineering geologist and Address:  [facility], [address], [place], [place] [address]      Provider Number: [id number]  Attending Physician Name and Address:  Gorden Harms, MD  Relative Name and Phone Number:  Karolee Ohs (Daughter) [phone] or Heloise Purpura (Daughter) [phone]    Current Level of Care: Hospital Recommended Level of Care: [place] Prior Approval Number:    Date Approved/Denied:   PASRR Number: [id number]  Discharge Plan: SNF    Current Diagnoses: Patient Active Problem List   Diagnosis Date Noted  . Acute renal failure ([place])   . ESRD (end stage renal disease) on dialysis ([place])   . Shortness of breath   . CHF (congestive heart failure) ([place]) 09/30/2017  . Atrial fibrillation ([place]) 09/22/2017  . Acute bilateral low back pain without sciatica 01/28/2017  . Pain in limb 07/22/2016  . Neuropathy 06/09/2016  . Pain in the chest 03/10/2016  . UTI (urinary tract infection) 03/03/2016  . Chronic deep vein thrombosis (DVT) ([facility]) 03/03/2016  . Chronic back pain 10/19/2015  . Goiter 04/25/2015  . Cervical disc disorder with radiculopathy of cervical region 03/30/2015  . Elevated LFTs 06/29/2014  . 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 Abnormality of gait 01/13/2014  . Sinoatrial node dysfunction ([place]) 01/09/2014  . Allergic rhinitis 10/26/2013  . Lymphedema 08/03/2013  . Constipation 04/12/2013  . Pulmonary embolus ([place]) 01/19/2013  . Subarachnoid hemorrhage ([place]) 01/19/2013  . Musculoskeletal chest pain 01/19/2013  . Preop cardiovascular exam 10/19/2012  . Anemia 04/28/2012  . Chronic respiratory failure with hypoxia ([place]) 04/26/2012  . Diabetes ([place]) 03/30/2012  .  Pulmonary hypertension ([place]) 03/30/2012  . Hyperlipidemia 03/30/2012  . Screening for breast cancer 03/30/2012  . Hypothyroidism 03/30/2012  . Chronic systolic heart failure ([place]) 01/22/2012  . Hypertension     Orientation RESPIRATION BLADDER Height & Weight     Self, Time, Situation, Place  O2(Chronic 4L; CPAP at night) Continent Weight: 243 lb 8 oz (110.5 kg) Height:  5\' 1"  (154.9 cm)  BEHAVIORAL SYMPTOMS/MOOD NEUROLOGICAL BOWEL NUTRITION STATUS      Continent Diet(heart healthy/carb modified)  AMBULATORY STATUS COMMUNICATION OF NEEDS Skin   Extensive Assist Verbally Normal                       Personal Care Assistance Level of Assistance  Bathing, Feeding, Dressing Bathing Assistance: Limited assistance Feeding assistance: Independent Dressing Assistance: Limited assistance     Functional Limitations Info  Sight, Hearing, Speech Sight Info: Adequate Hearing Info: Adequate Speech Info: Adequate    SPECIAL CARE FACTORS FREQUENCY  PT (By licensed PT)     PT Frequency: 5X per week              Contractures Contractures Info: Not present    Additional Factors Info  Code Status, Allergies, Psychotropic Code Status Info: Full Allergies Info: Aspirin, Other, Penicillins Psychotropic Info: Gabapentin         Current Medications (10/18/2017):  This is the current hospital active medication list Current Facility-Administered Medications  Medication Dose Route Frequency Provider Last Rate Last Dose  . 0.9 %  sodium chloride infusion  250 mL Intravenous PRN Salary, Avel Peace, MD   Stopped at 10/12/17 0830  . acetaminophen (  TYLENOL) tablet 650 mg  650 mg Oral Q4H PRN Loney Hering D, MD   650 mg at 10/13/17 2332  . ALPRAZolam Duanne Moron) tablet 0.25 mg  0.25 mg Oral BID PRN Loney Hering D, MD   0.25 mg at 10/16/17 2125  . alum & mag hydroxide-simeth (MAALOX/MYLANTA) 200-200-20 MG/5ML suspension 30 mL  30 mL Oral Q6H PRN Demetrios Loll, MD   30 mL at 10/17/17 2121   . amiodarone (PACERONE) tablet 200 mg  200 mg Oral BID Deboraha Sprang, MD   200 mg at 10/18/17 1103  . benzonatate (TESSALON) capsule 100 mg  100 mg Oral TID PRN Demetrios Loll, MD   100 mg at 10/17/17 2122  . budesonide (PULMICORT) nebulizer solution 0.5 mg  0.5 mg Nebulization BID Loletha Grayer, MD   0.5 mg at 10/18/17 0824  . carvedilol (COREG) tablet 3.125 mg  3.125 mg Oral BID WC Minna Merritts, MD   3.125 mg at 10/18/17 0850  . chlorhexidine (PERIDEX) 0.12 % solution 15 mL  15 mL Mouth Rinse BID Tukov-Yual, Magdalene S, NP   15 mL at 10/18/17 1103  . clopidogrel (PLAVIX) tablet 75 mg  75 mg Oral Daily Deboraha Sprang, MD   75 mg at 10/18/17 1103  . gabapentin (NEURONTIN) capsule 300 mg  300 mg Oral QHS Loletha Grayer, MD   300 mg at 10/17/17 2122  . insulin aspart (novoLOG) injection 0-15 Units  0-15 Units Subcutaneous TID WC Conforti, John, DO   5 Units at 10/18/17 1209  . insulin aspart (novoLOG) injection 0-5 Units  0-5 Units Subcutaneous QHS Conforti, John, DO   4 Units at 10/13/17 2200  . ipratropium-albuterol (DUONEB) 0.5-2.5 (3) MG/3ML nebulizer solution 3 mL  3 mL Nebulization Q4H PRN Loletha Grayer, MD   3 mL at 10/08/17 1724  . ipratropium-albuterol (DUONEB) 0.5-2.5 (3) MG/3ML nebulizer solution 3 mL  3 mL Nebulization TID Loletha Grayer, MD   3 mL at 10/18/17 1436  . MEDLINE mouth rinse  15 mL Mouth Rinse q12n4p Tukov-Yual, Magdalene S, NP   15 mL at 10/18/17 1557  . menthol-cetylpyridinium (CEPACOL) lozenge 3 mg  1 lozenge Oral PRN Loletha Grayer, MD   3 mg at 10/12/17 0845  . multivitamin with minerals tablet 1 tablet  1 tablet Oral Daily Loletha Grayer, MD   1 tablet at 10/18/17 1102  . mupirocin ointment (BACTROBAN) 2 %   Nasal BID Dustin Flock, MD      . ondansetron [facility]) injection 4 mg  4 mg Intravenous Q6H PRN Salary, Montell D, MD   4 mg at 10/03/17 1213  . ondansetron (ZOFRAN) tablet 8 mg  8 mg Oral Q8H PRN Salary, Montell D, MD      .  oxyCODONE-acetaminophen (PERCOCET/ROXICET) 5-325 MG per tablet 1-2 tablet  1-2 tablet Oral Q6H PRN Arta Silence, MD      . pantoprazole (PROTONIX) EC tablet 40 mg  40 mg Oral Daily Demetrios Loll, MD   40 mg at 10/18/17 1103  . promethazine (PHENERGAN) tablet 25 mg  25 mg Oral Q4H PRN Salary, Montell D, MD      . rosuvastatin (CRESTOR) tablet 10 mg  10 mg Oral Daily Salary, Montell D, MD   10 mg at 10/18/17 1103  . sildenafil (REVATIO) tablet 20 mg  20 mg Oral TID Loney Hering D, MD   20 mg at 10/18/17 1557  . sodium chloride flush (NS) 0.9 % injection 3 mL  3 mL Intravenous Q12H Salary, Montell  D, MD   3 mL at 10/18/17 1104  . sodium chloride flush (NS) 0.9 % injection 3 mL  3 mL Intravenous PRN Salary, Avel Peace, MD         Discharge Medications: Please see discharge summary for a list of discharge medications.  Relevant Imaging Results:  Relevant Lab Results:   Additional Information SS# [id number]  Zettie Pho, LCSW

## 2017-10-18 NOTE — Progress Notes (Signed)
Progress Note  Patient Name: Debra Saunders Date of Encounter: 10/18/2017  Primary Cardiologist: [name], MD   Subjective  Still some cough but no chest pain or sob.   Inpatient Medications    Scheduled Meds: . amiodarone  200 mg Oral BID  . apixaban  2.5 mg Oral BID  . budesonide (PULMICORT) nebulizer solution  0.5 mg Nebulization BID  . carvedilol  3.125 mg Oral BID WC  . chlorhexidine  15 mL Mouth Rinse BID  . clopidogrel  75 mg Oral Daily  . gabapentin  300 mg Oral QHS  . insulin aspart  0-15 Units Subcutaneous TID WC  . insulin aspart  0-5 Units Subcutaneous QHS  . ipratropium-albuterol  3 mL Nebulization TID  . mouth rinse  15 mL Mouth Rinse q12n4p  . multivitamin with minerals  1 tablet Oral Daily  . mupirocin ointment   Nasal BID  . pantoprazole  40 mg Oral Daily  . rosuvastatin  10 mg Oral Daily  . sildenafil  20 mg Oral TID  . sodium chloride flush  3 mL Intravenous Q12H  . torsemide  20 mg Oral Daily   Continuous Infusions: . sodium chloride Stopped (10/12/17 0830)   PRN Meds: sodium chloride, acetaminophen, ALPRAZolam, alum & mag hydroxide-simeth, benzonatate, ipratropium-albuterol, menthol-cetylpyridinium, ondansetron (ZOFRAN) IV, ondansetron, oxyCODONE-acetaminophen, promethazine, sodium chloride flush   Vital Signs    Vitals:   10/17/17 2327 10/18/17 0400 10/18/17 0825 10/18/17 0832  BP:  (!) 108/57  (!) 123/59  Pulse: 65 60 (!) 59 60  Resp: 18 16 18 19   Temp:  98 F (36.7 C)  99.2 F (37.3 C)  TempSrc:  Oral  Oral  SpO2: 98% 97% 98% 98%  Weight:  243 lb 8 oz (110.5 kg)    Height:        Intake/Output Summary (Last 24 hours) at 10/18/2017 0945 Last data filed at 10/18/2017 0400 Gross per 24 hour  Intake 256.98 ml  Output 400 ml  Net -143.02 ml   Filed Weights   10/16/17 0422 10/17/17 0250 10/18/17 0400  Weight: 250 lb 9.6 oz (113.7 kg) 247 lb 12.8 oz (112.4 kg) 243 lb 8 oz (110.5 kg)    Telemetry     nsr - Personally  Reviewed  ECG    none - Personally Reviewed  Physical Exam   GEN: obese 72 yo woman, No acute distress.   Neck: 6 cm JVD Cardiac: RRR, no murmurs, rubs, or gallops.  Respiratory: Clear to auscultation bilaterally. GI: Soft, nontender, non-distended  MS: No edema; No deformity. Neuro:  Nonfocal  Psych: Normal affect   Labs    Chemistry Recent Labs  Lab 10/14/17 1029 10/15/17 0336 10/16/17 0413 10/17/17 0420  NA 135 TEST WILL BE CREDITED  135 137 139  K 4.6 TEST WILL BE CREDITED  4.7 4.2 4.3  CL 101 TEST WILL BE CREDITED  101 103 105  CO2 25 TEST WILL BE CREDITED  26 29 29   GLUCOSE 233* TEST WILL BE CREDITED  247* 148* 112*  BUN 103* TEST WILL BE CREDITED  99* 94* 75*  CREATININE 2.26* TEST WILL BE CREDITED  2.07* 1.70* 1.48*  CALCIUM 9.6 TEST WILL BE CREDITED  9.3 9.0 9.1  ALBUMIN 3.3* 3.2*  --   --   GFRNONAA 20* NOT CALCULATED  22* 28* 34*  GFRAA 23* NOT CALCULATED  26* 33* 39*  ANIONGAP 9 TEST WILL BE CREDITED  8 5 5      Hematology Recent  Labs  Lab 10/16/17 0412 10/17/17 0420 10/18/17 0424  WBC 8.9 6.2 4.6  RBC 3.47* 3.40* 3.30*  HGB 9.7* 9.4* 9.3*  HCT 29.9* 29.2* 28.6*  MCV 86.0 86.0 86.6  MCH 27.9 27.6 28.1  MCHC 32.5 32.0 32.4  RDW 17.3* 17.2* 17.3*  PLT 167 164 136*    Cardiac EnzymesNo results for input(s): TROPONINI in the last 168 hours. No results for input(s): TROPIPOC in the last 168 hours.   BNPNo results for input(s): BNP, PROBNP in the last 168 hours.   DDimer No results for input(s): DDIMER in the last 168 hours.   Radiology    US Venous Img Lower Bilateral  Result Date: 10/16/2017 CLINICAL DATA:  72 year old female with a history of bilateral lower extremity edema and a prior left lower extremity DVT EXAM: BILATERAL LOWER EXTREMITY VENOUS DOPPLER ULTRASOUND TECHNIQUE: Gray-scale sonography with graded compression, as well as color Doppler and duplex ultrasound were performed to evaluate the lower extremity deep venous  systems from the level of the common femoral vein and including the common femoral, femoral, profunda femoral, popliteal and calf veins including the posterior tibial, peroneal and gastrocnemius veins when visible. The superficial great saphenous vein was also interrogated. Spectral Doppler was utilized to evaluate flow at rest and with distal augmentation maneuvers in the common femoral, femoral and popliteal veins. COMPARISON:  04/05/2014, 08/16/2013, 10/04/2012 FINDINGS: RIGHT LOWER EXTREMITY Common Femoral Vein: Common femoral vein patent with respiratory phasicity maintained and flow maintained. Saphenofemoral Junction: No evidence of thrombus. Normal compressibility and flow on color Doppler imaging. Profunda Femoral Vein: No evidence of thrombus. Normal compressibility and flow on color Doppler imaging. Femoral Vein: No evidence of thrombus. Normal compressibility, respiratory phasicity and response to augmentation. Popliteal Vein: No evidence of thrombus. Normal compressibility, respiratory phasicity and response to augmentation. Calf Veins: Peroneal vein patent with flow maintained and no thrombus. Occlusion of the right posterior tibial veins, of indeterminate chronicity. Superficial Great Saphenous Vein: No evidence of thrombus. Normal compressibility and flow on color Doppler imaging. Other Findings:  None. LEFT LOWER EXTREMITY Common Femoral Vein: Patent common femoral vein with flow maintained and respiratory phasicity maintained. Compressible vein. Saphenofemoral Junction: No evidence of thrombus. Normal compressibility and flow on color Doppler imaging. Profunda Femoral Vein: No evidence of thrombus. Normal compressibility and flow on color Doppler imaging. Femoral Vein: No evidence of thrombus. Normal compressibility, respiratory phasicity and response to augmentation. Popliteal Vein: No evidence of thrombus. Normal compressibility, respiratory phasicity and response to augmentation. Calf Veins:  Peroneal vein patent with flow maintained and compressible. One of the paired posterior tibial veins is occluded, indeterminate chronicity though was occluded on comparison studies of 2015 Superficial Great Saphenous Vein: No evidence of thrombus. Normal compressibility and flow on color Doppler imaging. Other Findings:  None. IMPRESSION: Negative sonographic survey for DVT of the bilateral common femoral vein, femoral vein, popliteal veins. The right-sided posterior tibial veins (calf) are occluded, indeterminate chronicity, and 1 of the paired left posterior tibial veins (calf) is occluded, likely chronic. Signed, Dulcy Fanny. Earleen Newport, DO [facility] Electronically Signed   By: Corrie Mckusick D.O.   On: 10/16/2017 12:07    Cardiac Studies   Study Conclusions  - Left ventricle: The cavity size was normal. There was mild concentric hypertrophy. Systolic function was mildly to moderately reduced. The estimated ejection fraction was in the range of 40% to 45%. Hypokinesis of the anterior, anteroseptal, and apical myocardium. basal regions best preserved. The study  is not technically sufficient to allow evaluation of LV diastolic function. - Mitral valve: There was moderate regurgitation. - Left atrium: The atrium was moderately dilated. - Right ventricle: The cavity size was moderately dilated. Wall thickness was normal. Systolic function was mildly reduced. - Right atrium: The atrium was moderately dilated. - Tricuspid valve: There was moderate regurgitation. - Pulmonary arteries: Systolic pressure was severely elevated. PA peak pressure: 65 mm Hg (S).    Patient Profile      72 y.o. female with history of nonobstructive CAD, chronic combined systolic and diastolic congestive heart failure, persistent atrial fibrilation, symptomatic bradycardia status post permanent pacemaker, frequent PVCs, chronic respiratory failure  with hypoxia on home O2, COPD, hypertension, hyperlipidemia, obesity, type 2 diabetes mellitus, traumatic subarachnoid hemorrhage in the setting of Xarelto, left lower extremity DVT status post IVC filter, chronic right sided atypical chest pain, chronic left lower extremity lymphedema, prior stroke, sleep apnea, and obesity, who is being seen for the evaluation ofacute on chronic combined heart failure in the setting of persistent atrial fibrillation.    Assessment & Plan    1  Acute on chronic systolic/diasotlic CHF - her symptoms appear back to baseline. She will undergo right and left heart cath tomorrow.    2  Persistent atrial fib - she appears to be maintaining NSR  3  MV disease   4  PAH -await right heart cath  5  NSTEM - right and left heart cath planned for Monday as renal function has improved. Will follow creatinine. Orders have been written  For questions or updates, please contact [facility] Please consult [email or web address] for contact info under Cardiology/STEMI.      Signed, Cristopher Peru, MD  10/18/2017, 9:45 AM

## 2017-10-19 ENCOUNTER — Encounter
Admission: EM | Disposition: A | Discharge status: Skilled Nursing Facility | Payer: Self-pay | Source: Home / Self Care | Attending: Internal Medicine

## 2017-10-19 ENCOUNTER — Encounter: Payer: Self-pay | Admitting: Cardiovascular Disease

## 2017-10-19 DIAGNOSIS — G4733 Obstructive sleep apnea (adult) (pediatric): Secondary | ICD-10-CM

## 2017-10-19 DIAGNOSIS — I270 Primary pulmonary hypertension: Secondary | ICD-10-CM

## 2017-10-19 HISTORY — PX: RIGHT/LEFT HEART CATH AND CORONARY ANGIOGRAPHY: CATH118266

## 2017-10-19 LAB — RENAL FUNCTION PANEL
Albumin: 2.8 g/dL — ABNORMAL LOW (ref 3.5–5.0)
Anion gap: 7 (ref 5–15)
BUN: 31 mg/dL — ABNORMAL HIGH (ref 6–20)
CO2: 29 mmol/L (ref 22–32)
Calcium: 8.8 mg/dL — ABNORMAL LOW (ref 8.9–10.3)
Chloride: 105 mmol/L (ref 101–111)
Creatinine, Ser: 1.02 mg/dL — ABNORMAL HIGH (ref 0.44–1.00)
GFR calc Af Amer: >60 mL/min (ref >60)
GFR calc non Af Amer: 53 mL/min — ABNORMAL LOW (ref >60)
Glucose, Bld: 134 mg/dL — ABNORMAL HIGH (ref 65–99)
Phosphorus: 3.1 mg/dL (ref 2.5–4.6)
Potassium: 3.9 mmol/L (ref 3.5–5.1)
Sodium: 141 mmol/L (ref 135–145)

## 2017-10-19 LAB — URINALYSIS, ROUTINE W REFLEX MICROSCOPIC
Bilirubin Urine: NEGATIVE
Glucose, UA: NEGATIVE mg/dL
Hgb urine dipstick: NEGATIVE
Ketones, ur: NEGATIVE mg/dL
Leukocytes, UA: NEGATIVE
Nitrite: NEGATIVE
Protein, ur: NEGATIVE mg/dL
Specific Gravity, Urine: 1.013 (ref 1.005–1.030)
pH: 6 (ref 5.0–8.0)

## 2017-10-19 LAB — GLUCOSE, CAPILLARY
Glucose-Capillary: 125 mg/dL — ABNORMAL HIGH (ref 65–99)
Glucose-Capillary: 123 mg/dL — ABNORMAL HIGH (ref 65–99)
Glucose-Capillary: 136 mg/dL — ABNORMAL HIGH (ref 65–99)
Glucose-Capillary: 192 mg/dL — ABNORMAL HIGH (ref 65–99)
Glucose-Capillary: 120 mg/dL — ABNORMAL HIGH (ref 65–99)

## 2017-10-19 SURGERY — RIGHT/LEFT HEART CATH AND CORONARY ANGIOGRAPHY
Anesthesia: Moderate Sedation

## 2017-10-19 MED ORDER — SODIUM CHLORIDE 0.9 % IV SOLN: INTRAVENOUS | Status: DC

## 2017-10-19 MED ORDER — SODIUM CHLORIDE 0.9% FLUSH
3.0000 mL | Freq: Two times a day (BID) | INTRAVENOUS | Status: DC
  Administered 2017-10-19 – 2017-10-22 (×6): 3 mL via INTRAVENOUS

## 2017-10-19 MED ORDER — MIDAZOLAM HCL 2 MG/2ML IJ SOLN
INTRAMUSCULAR | Status: DC | PRN
  Administered 2017-10-19: 0.5 mg via INTRAVENOUS

## 2017-10-19 MED ORDER — FENTANYL CITRATE (PF) 100 MCG/2ML IJ SOLN
INTRAMUSCULAR | Status: AC
  Filled 2017-10-19: qty 2

## 2017-10-19 MED ORDER — MIDAZOLAM HCL 2 MG/2ML IJ SOLN
INTRAMUSCULAR | Status: AC
  Filled 2017-10-19: qty 2

## 2017-10-19 MED ORDER — SODIUM CHLORIDE 0.9 % IV SOLN: 250.0000 mL | INTRAVENOUS | Status: DC | PRN

## 2017-10-19 MED ORDER — SODIUM CHLORIDE 0.9% FLUSH: 3.0000 mL | Freq: Two times a day (BID) | INTRAVENOUS | Status: DC

## 2017-10-19 MED ORDER — VERAPAMIL HCL 2.5 MG/ML IV SOLN
INTRAVENOUS | Status: AC
Start: 2017-10-19 — End: 2017-10-19
  Filled 2017-10-19: qty 2

## 2017-10-19 MED ORDER — SODIUM CHLORIDE 0.9% FLUSH: 3.0000 mL | INTRAVENOUS | Status: DC | PRN

## 2017-10-19 MED ORDER — TORSEMIDE 20 MG PO TABS
20.0000 mg | ORAL_TABLET | Freq: Every day | ORAL | Status: DC
  Administered 2017-10-19 – 2017-10-20 (×2): 20 mg via ORAL
  Filled 2017-10-19 (×2): qty 1

## 2017-10-19 MED ORDER — HEPARIN SODIUM (PORCINE) 1000 UNIT/ML IJ SOLN
INTRAMUSCULAR | Status: AC
  Filled 2017-10-19: qty 1

## 2017-10-19 MED ORDER — IOPAMIDOL (ISOVUE-300) INJECTION 61%
INTRAVENOUS | Status: DC | PRN
  Administered 2017-10-19: 35 mL via INTRA_ARTERIAL

## 2017-10-19 MED ORDER — ACETAMINOPHEN 325 MG PO TABS
ORAL_TABLET | ORAL | Status: AC
  Filled 2017-10-19: qty 2

## 2017-10-19 MED ORDER — SODIUM CHLORIDE 0.9 % IV SOLN
INTRAVENOUS | Status: DC
  Administered 2017-10-19: 09:00:00 via INTRAVENOUS

## 2017-10-19 MED ORDER — APIXABAN 5 MG PO TABS
5.0000 mg | ORAL_TABLET | Freq: Two times a day (BID) | ORAL | Status: DC
  Administered 2017-10-20 – 2017-10-22 (×5): 5 mg via ORAL
  Filled 2017-10-19 (×5): qty 1

## 2017-10-19 MED ORDER — AMIODARONE HCL 200 MG PO TABS
200.0000 mg | ORAL_TABLET | Freq: Every day | ORAL | Status: DC
  Administered 2017-10-19 – 2017-10-22 (×4): 200 mg via ORAL
  Filled 2017-10-19 (×4): qty 1

## 2017-10-19 MED ORDER — HEPARIN (PORCINE) IN NACL 1000-0.9 UT/500ML-% IV SOLN
INTRAVENOUS | Status: AC
Start: 2017-10-19 — End: 2017-10-19
  Filled 2017-10-19: qty 1000

## 2017-10-19 MED ORDER — FENTANYL CITRATE (PF) 100 MCG/2ML IJ SOLN
INTRAMUSCULAR | Status: DC | PRN
  Administered 2017-10-19: 25 ug via INTRAVENOUS

## 2017-10-19 SURGICAL SUPPLY — 14 items
CATH INFINITI 5FR JL4 (CATHETERS) ×2 IMPLANT
CATH INFINITI JR4 5F (CATHETERS) ×2 IMPLANT
CATH SWANZ 7F THERMO (CATHETERS) ×2 IMPLANT
GUIDEWIRE EMER 3M J .025X150CM (WIRE) ×2 IMPLANT
KIT MANI 3VAL PERCEP (MISCELLANEOUS) ×2 IMPLANT
KIT RIGHT HEART (MISCELLANEOUS) ×2 IMPLANT
NEEDLE PERC 18GX7CM (NEEDLE) ×2 IMPLANT
NEEDLE PERC 21GX4CM (NEEDLE) IMPLANT
PACK CARDIAC CATH (CUSTOM PROCEDURE TRAY) ×2 IMPLANT
SHEATH AVANTI 5FR X 11CM (SHEATH) ×2 IMPLANT
SHEATH PINNACLE 7F 10CM (SHEATH) ×2 IMPLANT
SHEATH RAIN RADIAL 21G 6FR (SHEATH) IMPLANT
WIRE GUIDERIGHT .035X150 (WIRE) ×2 IMPLANT
WIRE ROSEN-J .035X260CM (WIRE) IMPLANT

## 2017-10-19 NOTE — Progress Notes (Signed)
Nutrition Follow Up Note   DOCUMENTATION CODES:   Morbid obesity  INTERVENTION  Recommend liberalizing diet  MVI daily  NUTRITION DIAGNOSIS:   Inadequate oral intake related to acute illness as evidenced by per patient/family report   -ongoing   GOAL:   Patient will meet greater than or equal to 90% of their needs  -progressing   MONITOR:   PO intake, Labs, Weight trends, Skin, I & O's  ASSESSMENT:   72 y.o. female with a PMHx ofchronic diastolic heart failure, atrial fibrillation, chronic hypercarbic respiratory failure, COPD, morbid obesity, hyperlipidemia, diabetes mellitus type 2, chronic kidney disease stage II,historyof intracranial hemorrhagewho was admitted [facility]2019for evaluation of shortness of breath.    Pt with intermittent poor appetite and oral intake. Pt eating some sandwiches and portions of meals. Pt s/p cardiac cath today and reports good appetite after procedure. Pt upset about not being able to order grilled cheese sandwich. Pt does not like any supplements. Pt declines offer for snacks also. Continue MVI. Recommend liberalize diet as pt is not eating enough to exceed salt or carbohydrate limits and a heart healthy diet restricts protein also. Per chart, pt with weight gain since admit but wt coming back down now; pt with moderate edema in BLE. RD will continue to monitor for [place].   Medications reviewed and include: insulin, protonix, MVI, torsemide  Labs reviewed: BUN 31(H), creat 1.02(H), P 3.1 wnl cbgs- 125, 123, 136 x 24 hrs AIC 5.8(H)- 4/10  Diet Order:  Diet Carb Modified Fluid consistency: Thin; Room service appropriate? Yes  EDUCATION NEEDS:   Education needs have been addressed  Skin:  Skin Assessment: Reviewed RN Assessment  Last BM:  4/28- type 4  Height:   Ht Readings from Last 1 Encounters:  10/19/17 5\' 1"  (1.549 m)    Weight:   Wt Readings from Last 1 Encounters:  10/19/17 244 lb (110.7 kg)    Ideal Body  Weight:  47.7 kg  BMI:  Body mass index is 46.1 kg/m.  Estimated Nutritional Needs:   Kcal:  1700-2000kcal/day   Protein:  88-110g/day   Fluid:  >1.4L/day or per MD  Koleen Distance MS, RD, LDN Pager #[phone] After Hours Pager: [phone]

## 2017-10-19 NOTE — Progress Notes (Signed)
[place] [facility] [place] NAME: Debra Saunders    MR#:  [id number]  DATE OF BIRTH:  09-28-1945  SUBJECTIVE:  CHIEF COMPLAINT:   Chief Complaint  Patient presents with  . Shortness of Breath  feels sob REVIEW OF SYSTEMS:  CONSTITUTIONAL: No fever, fatigue or weakness.  EYES: No blurred or double vision.  EARS, NOSE, AND THROAT: No tinnitus or ear pain.  RESPIRATORY: No cough, shortness of breath, wheezing or hemoptysis.  CARDIOVASCULAR: No chest pain, orthopnea, edema.  GASTROINTESTINAL: No nausea, vomiting, diarrhea or abdominal pain.  GENITOURINARY: No dysuria, hematuria.  ENDOCRINE: No polyuria, nocturia,  HEMATOLOGY: No anemia, easy bruising or bleeding SKIN: No rash or lesion. MUSCULOSKELETAL: No joint pain or arthritis.   NEUROLOGIC: No tingling, numbness, weakness.  PSYCHIATRY: No anxiety or depression.   ROS  DRUG ALLERGIES:   Allergies  Allergen Reactions  . Aspirin Hives and Other (See Comments)    Pt states that she is unable to take because she had bleeding in her brain.    . Other Other (See Comments)    Pt states that she is unable to take blood thinners because she had bleeding in her brain.  Blood Thinners-per doctor at [facility]  . Penicillins Hives, Shortness Of Breath, Swelling and Other (See Comments)    Has patient had a PCN reaction causing immediate rash, facial/tongue/throat swelling, SOB or lightheadedness with hypotension: Yes Has patient had a PCN reaction causing severe rash involving mucus membranes or skin necrosis: No Has patient had a PCN reaction that required hospitalization No Has patient had a PCN reaction occurring within the last 10 years: No If all of the above answers are "NO", then may proceed with Cephalosporin use.    VITALS:  Blood pressure 119/62, pulse 60, temperature 98.6 F (37 C), temperature source Oral, resp. rate 18, height 5\' 1"  (1.549 m), weight 110.7 kg (244 lb), SpO2 99  %.  PHYSICAL EXAMINATION:  GENERAL:  72 y.o.-year-old patient lying in the bed with no acute distress.  EYES: Pupils equal, round, reactive to light and accommodation. No scleral icterus. Extraocular muscles intact.  HEENT: Head atraumatic, normocephalic. Oropharynx and nasopharynx clear.  NECK:  Supple, no jugular venous distention. No thyroid enlargement, no tenderness.  LUNGS: Normal breath sounds bilaterally, no wheezing, rales,rhonchi or crepitation. No use of accessory muscles of respiration.  CARDIOVASCULAR: S1, S2 normal. No murmurs, rubs, or gallops.  ABDOMEN: Soft, nontender, nondistended. Bowel sounds present. No organomegaly or mass.  EXTREMITIES: No pedal edema, cyanosis, or clubbing.  NEUROLOGIC: Cranial nerves II through XII are intact. Muscle strength 5/5 in all extremities. Sensation intact. Gait not checked.  PSYCHIATRIC: The patient is alert and oriented x 3.  SKIN: No obvious rash, lesion, or ulcer.   Physical Exam LABORATORY PANEL:   CBC Recent Labs  Lab 10/18/17 0424  WBC 4.6  HGB 9.3*  HCT 28.6*  PLT 136*   ------------------------------------------------------------------------------------------------------------------  Chemistries  Recent Labs  Lab 10/17/17 0420 10/19/17 0448  NA 139 141  K 4.3 3.9  CL 105 105  CO2 29 29  GLUCOSE 112* 134*  BUN 75* 31*  CREATININE 1.48* 1.02*  CALCIUM 9.1 8.8*  MG 1.9  --    ------------------------------------------------------------------------------------------------------------------  Cardiac Enzymes No results for input(s): TROPONINI in the last 168 hours. ------------------------------------------------------------------------------------------------------------------  RADIOLOGY:  No results found.  ASSESSMENT AND PLAN:  * Acute on chronic respiratory failure Secondary to acute on chronic diastolic CHF with flash pulmonary edema  Continue torsemide On 4 L continuous at home which is her  baseline  *Acute on chronic combined systolic and diastolic CHF:  New Cardiomyopathy concerning for ischemic cardiomyopathy EF was 40 to 45% by echo.  Cardiac catheterization today showed no significant coronary artery disease. Continue treatment with carvedilol and torsemide 20 mg once daily   *Persistent AF: Cardioversion this admission  normal sinus rhythm restored  Continue amiodarone 200 mg once daily. Eliquis to be resumed tomorrow morning per cardio  *Severe pulmonary hypertension: - Cont sildenafil.  Narrow window between renal dysfunction and shortness of breath. - Cardio Recommends referral to the advanced heart failure clinic in [place] - patient in agreement  *Acute NSTEMI - Compounded by Cardiomyopathy EF 40-45% - no significant CAD on cath  *Acute renal failure with CKD stage 3 - Likely component of ATN Renal function is back to baseline. Did require 2 hemodialysis treatments Nephrology input appreciated  *Acute epistaxis  Resolved   *Chronic morbid obesity  Most likely secondary to excess calories  Lifestyle modification recommended   *Acute on COPD exacerbation Resolved  *Hyperlipidemia, unspecified Stable on Crestor  *Chronic diabetes mellitus type 2 Stable on current regiment    needs STR.SNF at [place]   All the records are reviewed and case discussed with Care Management/Social Workerr. Management plans discussed with the patient, family (daughter over phone) and they are in agreement.  CODE STATUS: full  TOTAL TIME TAKING CARE OF THIS PATIENT: 35 minutes.     POSSIBLE D/C IN 1 DAYS, DEPENDING ON CLINICAL CONDITION.   Max Sane M.D on 10/19/2017   Between 7am to 6pm - Pager - [phone]  After 6pm go to [email or web address] - password [email or web address] [place] [facility]  Office  [phone]  CC: Primary care physician; [name], MD  Note: This dictation was prepared with Dragon dictation along with smaller  phrase technology. Any transcriptional errors that result from this process are unintentional.

## 2017-10-19 NOTE — Progress Notes (Signed)
* [facility]     Assessment and Plan:  Systolic, diastolic combined congestive heart failure. Atrial fibrillation status post cardioversion. Oliguric renal failure with volume overload. Pulmonary edema Obstructive sleep apnea History of pulmonary embolism, complicated by [facility] on anticoagulation.  Pulmonary hypertension  - Continue management of heart failure and renal failure, volume overload. - Continue sildenafil, will need outpatient follow-up for further titration of therapies for pulmonary hypertension.  Patient was previously on Letairis, but then this was no longer covered by insurance, and she was switched to sildenafil. Ideally this patient should be on a regimen of both Adcirca and Letairis.  - Nightly CPAP for obstructive sleep apnea.  At a pressure of 13.   Date: 10/19/2017  MRN# [id number] AMILLIANA HAYWORTH Nov 05, 1945   [name] is a 72 y.o. old female seen in follow up for chief complaint of  Chief Complaint  Patient presents with  . Shortness of Breath     Synopsis Patient is a 72 year old female with a history of chronic combined systolic and diastolic heart failure, A. 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  I have seen her in the outpatient setting where he was noted she has a history of pulmonary hypertension maintained on Letairis.  She was first diagnosed with pulmonary hypertension 2004, thought to be multifactorial due to congestive heart failure, thromboembolic disease, as well as pulmonary disease.  She was also noted to have obstructive sleep apnea, recommended to be on CPAP at 13.  Currently the patient has no new complaints.  She underwent right and left heart catheter earlier today, this was consistent with both cardiac dysfunction, as well as severe pulmonary hypertension.  Significant events: 4/10  patient presented to the emergency department on due to increased shortness of breath and weight gain over the past few weeks, combined with lower extremity and abdominal swelling.  She was diagnosed with volume overload and CHF, and started on diuresis.   10/01/2017 echocardiogram showed normal ejection fraction with a PA pressure estimated at 69 mmHg. 4/16 subsequently her course was complicated by COPD exacerbation, oliguric renal failure, initiated on hemodialysis. 4/18 the patient developed respiratory distress, was placed on BiPAP and transferred to the intensive care unit.  Chest x-ray at that time suggest pulmonary edema, patient was diuresed further and had subsequent improvement,  4/21 she was then transferred back to general medical floor.  4/22 she under went cardioversion for atrial fibrillation.. 4/23 patient experienced a significant nosebleed; echo on 4/23 shows EF of 40%, pulmonary artery pressure of 65. 4/25 patient was seen by palliative care service, she wished to maintain her full CODE STATUS consistent with my previous discussions with her in the office. 4/29; right and left heart cath; RA pressure: 23 mmHg, RV pressure: 84 over 13 mmHg, PA pressure:                  84/29 mmHg pulmonary capillary wedge pressure is 20 mmHg, LVEDP was 18 mmHg, cardiac                   output: 3.89 with a cardiac index of 1.89.  Subjective: Patient currently feels that her breathing has improved.  She has no current complaints, she notes that she is sleeping lately with CPAP, including last night and does well with it.    Medication:    Current Facility-Administered Medications:  .  0.9 %  sodium chloride infusion, 250 mL, Intravenous, PRN, Fletcher Anon, Muhammad A, MD .  acetaminophen (TYLENOL) 325 MG tablet, , , ,  .  acetaminophen (TYLENOL) tablet 650 mg, 650 mg, Oral, Q4H PRN, [name] A, MD, 650 mg at 10/19/17 1105 .  ALPRAZolam Duanne Moron) tablet 0.25 mg, 0.25 mg, Oral, BID PRN, [name] A, MD, 0.25 mg at 10/16/17 2125 .  alum & mag hydroxide-simeth (MAALOX/MYLANTA) 200-200-20 MG/5ML suspension 30 mL, 30 mL, Oral, Q6H PRN, Fletcher Anon, Muhammad A, MD, 30 mL at 10/17/17 2121 .  amiodarone (PACERONE) tablet 200 mg, 200 mg, Oral, Daily, Arida, Muhammad A, MD, 200 mg at 10/19/17 1225 .  [START ON 10/20/2017] apixaban (ELIQUIS) tablet 5 mg, 5 mg, Oral, BID, Arida, Muhammad A, MD .  benzonatate (TESSALON) capsule 100 mg, 100 mg, Oral, TID PRN, Wellington Hampshire, MD, 100 mg at 10/18/17 2109 .  budesonide (PULMICORT) nebulizer solution 0.5 mg, 0.5 mg, Nebulization, BID, Arida, Muhammad A, MD, 0.5 mg at 10/18/17 2047 .  carvedilol (COREG) tablet 3.125 mg, 3.125 mg, Oral, BID WC, Arida, Muhammad A, MD, 3.125 mg at 10/19/17 1225 .  chlorhexidine (PERIDEX) 0.12 % solution 15 mL, 15 mL, Mouth Rinse, BID, Arida, Muhammad A, MD, 15 mL at 10/18/17 2109 .  gabapentin (NEURONTIN) capsule 300 mg, 300 mg, Oral, QHS, Arida, Muhammad A, MD, 300 mg at 10/18/17 2109 .  insulin aspart (novoLOG) injection 0-15 Units, 0-15 Units, Subcutaneous, TID WC, Wellington Hampshire, MD, 2 Units at 10/19/17 1224 .  insulin aspart (novoLOG) injection 0-5 Units, 0-5 Units, Subcutaneous, QHS, Wellington Hampshire, MD, 4 Units at 10/13/17 2200 .  ipratropium-albuterol (DUONEB) 0.5-2.5 (3) MG/3ML nebulizer solution 3 mL, 3 mL, Nebulization, Q4H PRN, [name] A, MD, 3 mL at 10/08/17 1724 .  ipratropium-albuterol (DUONEB) 0.5-2.5 (3) MG/3ML nebulizer solution 3 mL, 3 mL, Nebulization, TID, Arida, Muhammad A, MD, 3 mL at 10/19/17 1350 .  MEDLINE mouth rinse, 15 mL, Mouth Rinse, q12n4p, Arida, Muhammad A, MD, 15 mL at 10/18/17 1557 .  menthol-cetylpyridinium (CEPACOL) lozenge 3 mg, 1 lozenge, Oral, PRN, Wellington Hampshire, MD, 3 mg at 10/12/17 0845 .  multivitamin with minerals tablet 1 tablet, 1 tablet, Oral, Daily, Wellington Hampshire, MD, 1 tablet at 10/18/17 1102 .  mupirocin ointment (BACTROBAN) 2 %, , Nasal, BID, Arida,  Muhammad A, MD .  ondansetron (ZOFRAN) injection 4 mg, 4 mg, Intravenous, Q6H PRN, [name] A, MD, 4 mg at 10/03/17 1213 .  ondansetron (ZOFRAN) tablet 8 mg, 8 mg, Oral, Q8H PRN, Arida, Muhammad A, MD .  oxyCODONE-acetaminophen (PERCOCET/ROXICET) 5-325 MG per tablet 1-2 tablet, 1-2 tablet, Oral, Q6H PRN, Fletcher Anon, Muhammad A, MD .  pantoprazole (PROTONIX) EC tablet 40 mg, 40 mg, Oral, Daily, Arida, Muhammad A, MD, 40 mg at 10/19/17 1226 .  promethazine (PHENERGAN) tablet 25 mg, 25 mg, Oral, Q4H PRN, Arida, Muhammad A, MD .  rosuvastatin (CRESTOR) tablet 10 mg, 10 mg, Oral, Daily, Arida, Muhammad A, MD, 10 mg at 10/19/17 1226 .  sildenafil (REVATIO) tablet 20 mg, 20 mg, Oral, TID, Fletcher Anon, Muhammad A, MD, 20 mg at 10/19/17 1224 .  sodium chloride flush (NS) 0.9 % injection 3 mL, 3 mL, Intravenous, Q12H, Arida, Muhammad A, MD, 3 mL at 10/19/17 1214 .  sodium chloride flush (NS) 0.9 % injection 3 mL, 3 mL, Intravenous, PRN, Arida, Muhammad A, MD .  sodium chloride flush (NS) 0.9 % injection 3 mL, 3 mL, Intravenous, Q12H, Fletcher Anon, Mertie Clause, MD,  3 mL at 10/19/17 1214 .  sodium chloride flush (NS) 0.9 % injection 3 mL, 3 mL, Intravenous, PRN, Arida, Muhammad A, MD .  torsemide (DEMADEX) tablet 20 mg, 20 mg, Oral, Daily, Arida, Muhammad A, MD, 20 mg at 10/19/17 1225   Allergies:  Aspirin; Other; and Penicillins  Review of Systems: Patient denies hemoptysis, further epistaxis, rhinitis. Other:  All other systems were reviewed and found to be negative other than what is mentioned in the HPI.   Physical Examination:   VS: BP (!) 143/71 (BP Location: Right Arm)   Pulse 60   Temp 97.7 F (36.5 C) (Oral)   Resp 14   Ht 5\' 1"  (1.549 m)   Wt 244 lb (110.7 kg)   SpO2 100%   BMI 46.10 kg/m    General Appearance: No distress  Neuro:without focal findings,  speech normal,  HEENT: PERRLA, EOM intact. Pulmonary: normal breath sounds, No wheezing.   CardiovascularNormal S1,S2.  No m/r/g.     Abdomen: Benign, Soft, non-tender. Renal:  No costovertebral tenderness  GU:  Not performed at this time. Endoc: No evident thyromegaly, no signs of acromegaly. Skin:   warm, no rash. Extremities: normal, no cyanosis, clubbing.   LABORATORY PANEL:   CBC Recent Labs  Lab 10/18/17 0424  WBC 4.6  HGB 9.3*  HCT 28.6*  PLT 136*   ------------------------------------------------------------------------------------------------------------------  Chemistries  Recent Labs  Lab 10/17/17 0420 10/19/17 0448  NA 139 141  K 4.3 3.9  CL 105 105  CO2 29 29  GLUCOSE 112* 134*  BUN 75* 31*  CREATININE 1.48* 1.02*  CALCIUM 9.1 8.8*  MG 1.9  --    ------------------------------------------------------------------------------------------------------------------  Cardiac Enzymes No results for input(s): TROPONINI in the last 168 hours. ------------------------------------------------------------  RADIOLOGY:   No results found for this or any previous visit. Results for orders placed during the hospital encounter of 09/30/17  DG Chest 2 View   Narrative CLINICAL DATA:  Shortness of breath and weight gain of 17 pounds over the past several weeks. Suspect CHF exacerbation. History of atrial fibrillation, CHF, COPD, former smoker.  EXAM: CHEST - 2 VIEW  COMPARISON:  Portable chest x-ray of August 25, 2017  FINDINGS: The right hemidiaphragm remains higher than the left. The right lung is hypoinflated. The left lung is better inflated. The interstitial markings are increased bilaterally with patchy areas of airspace opacity. The cardiac silhouette is enlarged. The pulmonary vascularity is engorged and indistinct. There are calcifications in the wall of the aortic arch. The ICD is in stable position. There is no significant pleural effusion.  IMPRESSION: Increased interstitial markings bilaterally consistent with interstitial and early alveolar edema. Certainly pneumonia is  not entirely excluded. No significant pleural effusion.  Thoracic aortic atherosclerosis.   Electronically Signed   By: David  Martinique M.D.   On: 09/30/2017 16:14    ------------------------------------------------------------------------------------------------------------------  Thank  you for allowing [facility] to assist in the care of your patient. Our recommendations are noted above.  Please contact us if we can be of further service.   Marda Stalker, MD.  [facility] Office Number: [phone]  Patricia Pesa, M.D.  Merton Border, M.D  10/19/2017

## 2017-10-19 NOTE — Progress Notes (Signed)
Progress Note  Patient Name: Debra Saunders Date of Encounter: 10/19/2017  Primary Cardiologist: Fletcher Anon  Subjective   Gradual improvement in shortness of breath over the weekend.  The patient underwent a right and left cardiac catheterization today which showed no significant coronary artery disease.  Right heart catheterization showed mildly elevated filling pressures, severe pulmonary hypertension and severely reduced cardiac output.  Inpatient Medications    Scheduled Meds: . acetaminophen      . amiodarone  200 mg Oral Daily  . [START ON 10/20/2017] apixaban  5 mg Oral BID  . budesonide (PULMICORT) nebulizer solution  0.5 mg Nebulization BID  . carvedilol  3.125 mg Oral BID WC  . chlorhexidine  15 mL Mouth Rinse BID  . gabapentin  300 mg Oral QHS  . insulin aspart  0-15 Units Subcutaneous TID WC  . insulin aspart  0-5 Units Subcutaneous QHS  . ipratropium-albuterol  3 mL Nebulization TID  . mouth rinse  15 mL Mouth Rinse q12n4p  . multivitamin with minerals  1 tablet Oral Daily  . mupirocin ointment   Nasal BID  . pantoprazole  40 mg Oral Daily  . rosuvastatin  10 mg Oral Daily  . sildenafil  20 mg Oral TID  . sodium chloride flush  3 mL Intravenous Q12H  . sodium chloride flush  3 mL Intravenous Q12H  . torsemide  20 mg Oral Daily   Continuous Infusions: . sodium chloride     PRN Meds: sodium chloride, acetaminophen, ALPRAZolam, alum & mag hydroxide-simeth, benzonatate, ipratropium-albuterol, menthol-cetylpyridinium, ondansetron (ZOFRAN) IV, ondansetron, oxyCODONE-acetaminophen, promethazine, sodium chloride flush, sodium chloride flush   Vital Signs    Vitals:   10/19/17 1115 10/19/17 1141 10/19/17 1350 10/19/17 1638  BP: (!) 160/83 (!) 143/71  (!) 121/55  Pulse: 60 60  60  Resp: 14     Temp:  97.7 F (36.5 C)  98.6 F (37 C)  TempSrc:  Oral  Oral  SpO2: 98% 100% 100% 100%  Weight:      Height:        Intake/Output Summary (Last 24 hours) at  10/19/2017 1715 Last data filed at 10/19/2017 0800 Gross per 24 hour  Intake -  Output 1450 ml  Net -1450 ml   Filed Weights   10/18/17 0400 10/19/17 0408 10/19/17 0835  Weight: 243 lb 8 oz (110.5 kg) 244 lb 12.8 oz (111 kg) 244 lb (110.7 kg)    Telemetry    paced - Personally Reviewed  ECG    n/a - Personally Reviewed  Physical Exam   Constitutional:  oriented to person, place, and time. No distress. Mild shortness of breath with talking,  on nasal cannula oxygen HENT:  Head: Normocephalic and atraumatic.  Eyes:  no discharge. No scleral icterus.  Neck: Normal range of motion. Neck supple. No JVD present.  Cardiovascular: Normal rate, regular rhythm, normal heart sounds and intact distal pulses. Exam reveals no gallop and no friction rub.  Mild bilateral leg edema. No murmur heard. Pulmonary/Chest: Effort normal and breath sounds normal. No stridor. No respiratory distress.  no wheezes.  Rales at the bases,  no tenderness.  Abdominal: Soft.  no distension.  no tenderness.  Musculoskeletal: Normal range of motion.  no  tenderness or deformity.  Neurological:  normal muscle tone. Coordination normal. No atrophy Skin: Skin is warm and dry. No rash noted. not diaphoretic.  Psychiatric:  normal mood and affect. behavior is normal. Thought content normal.  Labs    Chemistry Recent Labs  Lab 10/14/17 1029 10/15/17 0336 10/16/17 0413 10/17/17 0420 10/19/17 0448  NA 135 TEST WILL BE CREDITED  135 137 139 141  K 4.6 TEST WILL BE CREDITED  4.7 4.2 4.3 3.9  CL 101 TEST WILL BE CREDITED  101 103 105 105  CO2 25 TEST WILL BE CREDITED  26 29 29 29   GLUCOSE 233* TEST WILL BE CREDITED  247* 148* 112* 134*  BUN 103* TEST WILL BE CREDITED  99* 94* 75* 31*  CREATININE 2.26* TEST WILL BE CREDITED  2.07* 1.70* 1.48* 1.02*  CALCIUM 9.6 TEST WILL BE CREDITED  9.3 9.0 9.1 8.8*  ALBUMIN 3.3* 3.2*  --   --  2.8*  GFRNONAA 20* NOT CALCULATED  22* 28* 34* 53*  GFRAA 23* NOT  CALCULATED  26* 33* 39* >60  ANIONGAP 9 TEST WILL BE CREDITED  8 5 5 7      Hematology Recent Labs  Lab 10/16/17 0412 10/17/17 0420 10/18/17 0424  WBC 8.9 6.2 4.6  RBC 3.47* 3.40* 3.30*  HGB 9.7* 9.4* 9.3*  HCT 29.9* 29.2* 28.6*  MCV 86.0 86.0 86.6  MCH 27.9 27.6 28.1  MCHC 32.5 32.0 32.4  RDW 17.3* 17.2* 17.3*  PLT 167 164 136*    Cardiac Enzymes No results for input(s): TROPONINI in the last 168 hours. No results for input(s): TROPIPOC in the last 168 hours.   BNPNo results for input(s): BNP, PROBNP in the last 168 hours.   DDimer No results for input(s): DDIMER in the last 168 hours.   Radiology    No results found.  Cardiac Studies   TTE 10/13/2017: Study Conclusions  - Left ventricle: The cavity size was normal. There was mild   concentric hypertrophy. Systolic function was mildly to   moderately reduced. The estimated ejection fraction was in the   range of 40% to 45%. Hypokinesis of the anterior, anteroseptal,   and apical myocardium. basal regions best preserved. The study is   not technically sufficient to allow evaluation of LV diastolic   function. - Mitral valve: There was moderate regurgitation. - Left atrium: The atrium was moderately dilated. - Right ventricle: The cavity size was moderately dilated. Wall   thickness was normal. Systolic function was mildly reduced. - Right atrium: The atrium was moderately dilated. - Tricuspid valve: There was moderate regurgitation. - Pulmonary arteries: Systolic pressure was severely elevated. PA   peak pressure: 65 mm Hg (S).  Patient Profile     72 y.o. female with history of nonobstructive CAD, chronic combined systolic and diastolic congestive heart failure, persistent atrial fibrilation, symptomatic bradycardia status post permanent pacemaker, frequent PVCs, chronic respiratory failure with hypoxia on home O2, COPD, hypertension, hyperlipidemia, obesity, type 2 diabetes mellitus, traumatic subarachnoid  hemorrhage in the setting of Xarelto, left lower extremity DVT status post IVC filter, chronic right sided atypical chest pain, chronic left lower extremity lymphedema, prior stroke, sleep apnea, and obesity, who is being seen for the evaluation ofacute on chronic combined heart failure in the setting of persistent atrial fibrillation.  Assessment & Plan   1. Acute renal failure  Likely component of ATN Renal function is returning to baseline.  2. Acute on chronic combined systolic and diastolic CHF:  New Cardiomyopathy concerning for ischemic cardiomyopathy EF was 40 to 45% by echo.  Cardiac catheterization today showed no significant coronary artery disease. Continue treatment with carvedilol. I placed her on torsemide 20 mg once daily given that LVEDP  was mildly elevated around 18 mmHg.  3. Persistent AF: Cardioversion this admission  normal sinus rhythm restored  Continue amiodarone.  I decreased the dose to 200 mg once daily. Eliquis can be resumed tomorrow morning if no bleeding complications from the right groin.   4. Mitral valve disease Most recent echo showed moderate MR and there was no significant V waves on pulmonary wedge pressure tracings.  Continue medical therapy.  5. Severe pulmonary hypertension: Likely a combination of left heart failure as well as primary pulmonary hypertension given degree of elevation. Cont sildenafil.  Narrow window between renal dysfunction and shortness of breath. Recommend referral to the advanced heart failure clinic in [facility] for management of this.  Possible discharge home in 1 to 2 days if she continues to improve.   Total encounter time more than 25 minutes  Greater than 50% was spent in counseling and coordination of care with the patient   For questions or updates, please contact [place] Please consult [email or web address] for contact info under Cardiology/STEMI.    Signed, Signed, [name], MD [facility]

## 2017-10-19 NOTE — Progress Notes (Addendum)
PT Cancellation Note  Patient Details Name: JASLYN BANSAL MRN: [id number] DOB: [birth year]   Cancelled Treatment:    Reason Eval/Treat Not Completed: Patient at procedure or test/unavailable. Pt unavailable at this time for PT; pt in surgery for B heart catheterization and coronary angiography. Re attempt at a later date, as pt medically able. New PT orders may be required.    Larae Grooms, PTA 10/19/2017, 10:50 AM

## 2017-10-19 NOTE — Interval H&P Note (Signed)
History and Physical Interval Note:  10/19/2017 9:05 AM  Debra Saunders  has presented today for surgery, with the diagnosis of acute systolic heart failure with pulmonary hypertension  The various methods of treatment have been discussed with the patient and family. After consideration of risks, benefits and other options for treatment, the patient has consented to  Procedure(s): RIGHT/LEFT HEART CATH AND CORONARY ANGIOGRAPHY (N/A) as a surgical intervention .  The patient's history has been reviewed, patient examined, no change in status, stable for surgery.  I have reviewed the patient's chart and labs.  Questions were answered to the patient's satisfaction.     [name]

## 2017-10-20 ENCOUNTER — Ambulatory Visit: Payer: Medicare HMO | Admitting: Family

## 2017-10-20 LAB — CBC
HCT: 30.2 % — ABNORMAL LOW (ref 35.0–47.0)
Hemoglobin: 9.6 g/dL — ABNORMAL LOW (ref 12.0–16.0)
MCH: 27.3 pg (ref 26.0–34.0)
MCHC: 31.6 g/dL — ABNORMAL LOW (ref 32.0–36.0)
MCV: 86.5 fL (ref 80.0–100.0)
Platelets: 136 10*3/uL — ABNORMAL LOW (ref 150–440)
RBC: 3.49 MIL/uL — ABNORMAL LOW (ref 3.80–5.20)
RDW: 17.6 % — ABNORMAL HIGH (ref 11.5–14.5)
WBC: 5.2 10*3/uL (ref 3.6–11.0)

## 2017-10-20 LAB — BASIC METABOLIC PANEL
Anion gap: 8 (ref 5–15)
BUN: 26 mg/dL — ABNORMAL HIGH (ref 6–20)
CO2: 31 mmol/L (ref 22–32)
Calcium: 8.7 mg/dL — ABNORMAL LOW (ref 8.9–10.3)
Chloride: 101 mmol/L (ref 101–111)
Creatinine, Ser: 0.97 mg/dL (ref 0.44–1.00)
GFR calc Af Amer: >60 mL/min (ref >60)
GFR calc non Af Amer: 56 mL/min — ABNORMAL LOW (ref >60)
Glucose, Bld: 135 mg/dL — ABNORMAL HIGH (ref 65–99)
Potassium: 3.3 mmol/L — ABNORMAL LOW (ref 3.5–5.1)
Sodium: 140 mmol/L (ref 135–145)

## 2017-10-20 LAB — GLUCOSE, CAPILLARY
Glucose-Capillary: 118 mg/dL — ABNORMAL HIGH (ref 65–99)
Glucose-Capillary: 170 mg/dL — ABNORMAL HIGH (ref 65–99)
Glucose-Capillary: 159 mg/dL — ABNORMAL HIGH (ref 65–99)
Glucose-Capillary: 128 mg/dL — ABNORMAL HIGH (ref 65–99)

## 2017-10-20 MED ORDER — TORSEMIDE 20 MG PO TABS
40.0000 mg | ORAL_TABLET | Freq: Every day | ORAL | Status: DC
  Administered 2017-10-20: 20 mg via ORAL
  Filled 2017-10-20: qty 2

## 2017-10-20 MED ORDER — SILDENAFIL CITRATE 20 MG PO TABS
40.0000 mg | ORAL_TABLET | Freq: Three times a day (TID) | ORAL | Status: DC
  Administered 2017-10-20 – 2017-10-22 (×6): 40 mg via ORAL
  Filled 2017-10-20 (×9): qty 2

## 2017-10-20 NOTE — Clinical Social Work Note (Addendum)
CSW spoke with patient and her daughter, they would like to return back to [facility] [place].  Patient is regular [employer], [facility] can accept patient once insurance has been approved.  [facility] has started insurance authorization, hopefully patient will be approved on Wednesday.  CSW to continue to follow patient's progress and facilitate discharge planning.  Jones Broom. Norval Morton, MSW, [place]  10/20/2017 6:44 PM

## 2017-10-20 NOTE — Progress Notes (Signed)
Physical Therapy Treatment Patient Details Name: Debra Saunders MRN: [id number] DOB: Apr 23, 1946 Today's Date: 10/20/2017    History of Present Illness presented to ER secondary to SOB, weigth gain; admitted with CHF exacerbation, volume overload.  Hospital course complicated by worsening renal failure requiring temporary dialysis (temp fem cath removed 4/18); worsening cardio-renal syndrome with severe respiratory distress requiring BiPAP and transfer to CCU; NSTEMI (troponin 2.6).  Status post cardioversion 4/22 due to afib with RVR; pending cardiac cath 4/29.    PT Comments    Pt in recliner, fatigued but willing to try.  Stood with increased time and heavy use of BUE's  Once standing she was able to take several slow shuffling steps until needing to sit after 12' due to fatigue.  Pt did complain of some chest discomfort upon sitting.  Pain was midline and relieved in about 3 minutes.  HR and O2 checked and within limits.  Pt attributed it to holding her breath during gait.  After an extended seated rest break, she was offered to transfer back into the recliner but she chose to walk to recliner an additional 6'.  While she denied further chest pain, further gait was difficult for her due to fatigue.    Discussed with primary nurse and notified of chest pain that resolved.  Also discussed concerns for her returning home as she lives alone and home mobility and management would be extremely challenging for her at this time. SNF remains the most appropriate discharge disposition at this time.    Follow Up Recommendations  SNF     Equipment Recommendations  Rolling walker with 5" wheels;3in1 (PT)    Recommendations for Other Services       Precautions / Restrictions Precautions Precautions: Fall Restrictions Weight Bearing Restrictions: No    Mobility  Bed Mobility               General bed mobility comments: in recliner  Transfers Overall transfer level: Needs  assistance Equipment used: Rolling walker (2 wheeled) Transfers: Sit to/from Stand Sit to Stand: Min assist         General transfer comment: increased time and heavy use of BUE to stand  Ambulation/Gait Ambulation/Gait assistance: Min assist Ambulation Distance (Feet): 12 Feet Assistive device: Rolling walker (2 wheeled) Gait Pattern/deviations: Step-to pattern;Shuffle Gait velocity: reduced Gait velocity interpretation: <1.8 ft/sec, indicate of risk for recurrent falls General Gait Details: very slow with significant effort   Stairs             Wheelchair Mobility    Modified Rankin (Stroke Patients Only)       Balance Overall balance assessment: Needs assistance Sitting-balance support: Feet supported;No upper extremity supported Sitting balance-Leahy Scale: Good     Standing balance support: Bilateral upper extremity supported Standing balance-Leahy Scale: Fair Standing balance comment: heavy reliance of walker for gait                            Cognition Arousal/Alertness: Awake/alert Behavior During Therapy: WFL for tasks assessed/performed Overall Cognitive Status: Within Functional Limits for tasks assessed                                        Exercises      General Comments        Pertinent Vitals/Pain Pain Assessment: 0-10 Pain Score: 2  Pain  Location: shome chest pain after gait Pain Descriptors / Indicators: Discomfort Pain Intervention(s): Limited activity within patient's tolerance;Monitored during session    Home Living                      Prior Function            PT Goals (current goals can now be found in the care plan section) Progress towards PT goals: Progressing toward goals    Frequency           PT Plan Current plan remains appropriate    Co-evaluation              AM-PAC PT "6 Clicks" Daily Activity  Outcome Measure  Difficulty turning over in bed  (including adjusting bedclothes, sheets and blankets)?: Unable Difficulty moving from lying on back to sitting on the side of the bed? : Unable Difficulty sitting down on and standing up from a chair with arms (e.g., wheelchair, bedside commode, etc,.)?: Unable Help needed moving to and from a bed to chair (including a wheelchair)?: A Little Help needed walking in hospital room?: A Little Help needed climbing 3-5 steps with a railing? : A Lot 6 Click Score: 11    End of Session Equipment Utilized During Treatment: Gait belt;Oxygen   Patient left: in chair;with chair alarm set;with call bell/phone within reach Nurse Communication: Other (comment);Mobility status       Time: 1045-1101 PT Time Calculation (min) (ACUTE ONLY): 16 min  Charges:  $Gait Training: 8-22 mins                    G Codes:       Chesley Noon, PTA 10/20/17, 11:12 AM

## 2017-10-20 NOTE — Progress Notes (Signed)
Progress Note  Patient Name: Debra Saunders Date of Encounter: 10/20/2017  Primary Cardiologist: Fletcher Anon  Subjective   No complaints this morning. SOB improving. No chest pain. R/LHC on 4/29 showed nonobstructive CAD with severe pulmonary hypertension that seemed to be out of proportion to left heart failure and severely reduced cardiac output. Post-cath labs show a stable to improving SCr. Weight down 2 pounds over the past 24 hours. Net negative 18 L for the admission. BP improved.   Inpatient Medications    Scheduled Meds: . amiodarone  200 mg Oral Daily  . apixaban  5 mg Oral BID  . budesonide (PULMICORT) nebulizer solution  0.5 mg Nebulization BID  . carvedilol  3.125 mg Oral BID WC  . chlorhexidine  15 mL Mouth Rinse BID  . gabapentin  300 mg Oral QHS  . insulin aspart  0-15 Units Subcutaneous TID WC  . insulin aspart  0-5 Units Subcutaneous QHS  . ipratropium-albuterol  3 mL Nebulization TID  . mouth rinse  15 mL Mouth Rinse q12n4p  . multivitamin with minerals  1 tablet Oral Daily  . mupirocin ointment   Nasal BID  . pantoprazole  40 mg Oral Daily  . rosuvastatin  10 mg Oral Daily  . sildenafil  40 mg Oral TID  . sodium chloride flush  3 mL Intravenous Q12H  . sodium chloride flush  3 mL Intravenous Q12H  . torsemide  20 mg Oral Daily   Continuous Infusions: . sodium chloride     PRN Meds: sodium chloride, acetaminophen, ALPRAZolam, alum & mag hydroxide-simeth, benzonatate, ipratropium-albuterol, menthol-cetylpyridinium, ondansetron (ZOFRAN) IV, ondansetron, oxyCODONE-acetaminophen, promethazine, sodium chloride flush, sodium chloride flush   Vital Signs    Vitals:   10/19/17 2110 10/20/17 0352 10/20/17 0739 10/20/17 0841  BP:  139/65 125/67   Pulse:  (!) 59 64   Resp:  17 18   Temp:  97.9 F (36.6 C)    TempSrc:      SpO2: 98% 97% 95% 95%  Weight:  242 lb 8 oz (110 kg)    Height:        Intake/Output Summary (Last 24 hours) at 10/20/2017 0912 Last  data filed at 10/19/2017 2238 Gross per 24 hour  Intake 6 ml  Output 6500 ml  Net -6494 ml   Filed Weights   10/19/17 0408 10/19/17 0835 10/20/17 0352  Weight: 244 lb 12.8 oz (111 kg) 244 lb (110.7 kg) 242 lb 8 oz (110 kg)    Telemetry    paced - Personally Reviewed  ECG    n/a - Personally Reviewed  Physical Exam   GEN: No acute distress.   Neck: No JVD. Cardiac: RRR, no murmurs, rubs, or gallops. Right femoral cardiac cath site without bleeding, swelling, erythema, warmth, or TTP. No bruit.  Respiratory: Diminished breath sounds bilaterally with faint expiratory wheezing.  GI: Soft, nontender, non-distended.   MS: No edema; No deformity. Neuro:  Alert and oriented x 3; Nonfocal.  Psych: Normal affect.  Labs    Chemistry Recent Labs  Lab 10/14/17 1029 10/15/17 0336  10/17/17 0420 10/19/17 0448 10/20/17 0453  NA 135 TEST WILL BE CREDITED  135   < > 139 141 140  K 4.6 TEST WILL BE CREDITED  4.7   < > 4.3 3.9 3.3*  CL 101 TEST WILL BE CREDITED  101   < > 105 105 101  CO2 25 TEST WILL BE CREDITED  26   < > 29 29 31  GLUCOSE 233* TEST WILL BE CREDITED  247*   < > 112* 134* 135*  BUN 103* TEST WILL BE CREDITED  99*   < > 75* 31* 26*  CREATININE 2.26* TEST WILL BE CREDITED  2.07*   < > 1.48* 1.02* 0.97  CALCIUM 9.6 TEST WILL BE CREDITED  9.3   < > 9.1 8.8* 8.7*  ALBUMIN 3.3* 3.2*  --   --  2.8*  --   GFRNONAA 20* NOT CALCULATED  22*   < > 34* 53* 56*  GFRAA 23* NOT CALCULATED  26*   < > 39* >60 >60  ANIONGAP 9 TEST WILL BE CREDITED  8   < > 5 7 8    < > = values in this interval not displayed.     Hematology Recent Labs  Lab 10/17/17 0420 10/18/17 0424 10/20/17 0453  WBC 6.2 4.6 5.2  RBC 3.40* 3.30* 3.49*  HGB 9.4* 9.3* 9.6*  HCT 29.2* 28.6* 30.2*  MCV 86.0 86.6 86.5  MCH 27.6 28.1 27.3  MCHC 32.0 32.4 31.6*  RDW 17.2* 17.3* 17.6*  PLT 164 136* 136*    Cardiac EnzymesNo results for input(s): TROPONINI in the last 168 hours. No results for  input(s): TROPIPOC in the last 168 hours.   BNPNo results for input(s): BNP, PROBNP in the last 168 hours.   DDimer No results for input(s): DDIMER in the last 168 hours.   Radiology    No results found.  Cardiac Studies   TTE 10/13/2017: Study Conclusions  - Left ventricle: The cavity size was normal. There was mild concentric hypertrophy. Systolic function was mildly to moderately reduced. The estimated ejection fraction was in the range of 40% to 45%. Hypokinesis of the anterior, anteroseptal, and apical myocardium. basal regions best preserved. The study is not technically sufficient to allow evaluation of LV diastolic function. - Mitral valve: There was moderate regurgitation. - Left atrium: The atrium was moderately dilated. - Right ventricle: The cavity size was moderately dilated. Wall thickness was normal. Systolic function was mildly reduced. - Right atrium: The atrium was moderately dilated. - Tricuspid valve: There was moderate regurgitation. - Pulmonary arteries: Systolic pressure was severely elevated. PA peak pressure: 65 mm Hg (S).   R/LHC 10/19/2017: Conclusion   1.  Minor irregularities with no significant coronary artery disease. 2.  Left ventricular angiography was not performed when recent acute renal failure.  EF was mildly reduced by echo. 3.  Right heart catheterization showed mildly elevated filling pressures, severe pulmonary hypertension and severely reduced cardiac output. RA pressure: 23 mmHg, RV pressure: 84 over 13 mmHg, PA pressure: 84/29 mmHg pulmonary capillary wedge pressure is 20 mmHg, LVEDP was 18 mmHg, cardiac output: 3.89 with a cardiac index of 1.89.  Recommendations: The patient has severe pulmonary hypertension that seems to be out of proportion to left heart failure.  I resumed small dose torsemide 20 mg once daily. Resume Eliquis 5 mg twice daily tomorrow if no bleeding complications. The patient will need to be  referred to advanced heart failure clinic in [facility] after hospital discharge to manage severe pulmonary hypertension.     Patient Profile     72 y.o. female with history of nonobstructive CAD, chronic combined systolic and diastolic congestive heart failure, persistent atrial fibrilation, symptomatic bradycardia status post permanent pacemaker, frequent PVCs, chronic respiratory failure with hypoxia on home O2, COPD, hypertension, hyperlipidemia, obesity, type 2 diabetes mellitus, traumatic subarachnoid hemorrhage in the setting of Xarelto, left lower extremity DVT status  post IVC filter, chronic right sided atypical chest pain, chronic left lower extremity lymphedema, prior stroke, sleep apnea, and obesity, who is being seen for the evaluation ofacute on chronic combined heart failure in the setting of persistent atrial fibrillation.  Assessment & Plan    1. Acute on chronic combined CHF/NICM/severe pulmonary hypertension: -Improving, though she does have a narrow window between renal failure and SOB -R/LHC as above -Continue Coreg -Escalate Revatio to 40 mg tid -Continue torsemide 20 mg daily -She will need to follow up with the advanced heart failure clinic in [place] as an outpatient   2. Persistent Afib: -Status post successful DCCV this admission -Continue amiodarone 200 mg daily and Coreg -Resume Eliquis (held for [facility] as above)  3. ARF: -Likely a component of ATN -Improved -Renal function now normal  4. Mitral valve disease: -Improved by follow up echo after DCCV -Outpatient follow up  For questions or updates, please contact [place] Please consult [email or web address] for contact info under Cardiology/STEMI.    Signed, Christell Faith, PA-C [facility] Pager: [phone] 10/20/2017, 9:12 AM

## 2017-10-20 NOTE — Progress Notes (Signed)
[facility]  ROUNDING NOTE   Subjective:   Patient underwent cardiac cath yesterday.  She was noted to have severe pulmonary hypertension Has now returned to normal at 0.97.  Potassium is slightly low at 3.9 She continues to have significant lower extremity edema   Objective:  Vital signs in last 24 hours:  Temp:  [97.7 F (36.5 C)-98.6 F (37 C)] 97.9 F (36.6 C) (04/30 0352) Pulse Rate:  [59-64] 64 (04/30 0739) Resp:  [12-18] 18 (04/30 0739) BP: (119-180)/(55-88) 125/67 (04/30 0739) SpO2:  [95 %-100 %] 95 % (04/30 0841) Weight:  [242 lb 8 oz (110 kg)] 242 lb 8 oz (110 kg) (04/30 0352)  Weight change: -12.8 oz (-0.363 kg) Filed Weights   10/19/17 0408 10/19/17 0835 10/20/17 0352  Weight: 244 lb 12.8 oz (111 kg) 244 lb (110.7 kg) 242 lb 8 oz (110 kg)    Intake/Output: I/O last 3 completed shifts: In: 6 [I.V.:6] Out: 7950 [Urine:7950]   Intake/Output this shift:  Total I/O In: -  Out: 800 [Urine:800]  Physical Exam: General:  No acute distress  Head: Normocephalic, atraumatic.   Eyes: Anicteric  Neck: Supple, trachea midline  Lungs:  Scattered rhonchi, normal effort, mild basilar crackles  Heart: S1S2 no rubs, S3 gallop  Abdomen:  Soft, nontender, bowel sounds present  Extremities: 2+peripheral edema.   Neurologic: Awake, alert, following commands  Skin: warm       Basic Metabolic Panel: Recent Labs  Lab 10/14/17 1029 10/15/17 0336 10/16/17 0413 10/17/17 0420 10/19/17 0448 10/20/17 0453  NA 135 TEST WILL BE CREDITED  135 137 139 141 140  K 4.6 TEST WILL BE CREDITED  4.7 4.2 4.3 3.9 3.3*  CL 101 TEST WILL BE CREDITED  101 103 105 105 101  CO2 25 TEST WILL BE CREDITED  26 29 29 29 31   GLUCOSE 233* TEST WILL BE CREDITED  247* 148* 112* 134* 135*  BUN 103* TEST WILL BE CREDITED  99* 94* 75* 31* 26*  CREATININE 2.26* TEST WILL BE CREDITED  2.07* 1.70* 1.48* 1.02* 0.97  CALCIUM 9.6 TEST WILL BE CREDITED  9.3 9.0 9.1 8.8* 8.7*  MG  --    --   --  1.9  --   --   PHOS 5.6* 5.0*  --   --  3.1  --     Liver Function Tests: Recent Labs  Lab 10/14/17 1029 10/15/17 0336 10/19/17 0448  ALBUMIN 3.3* 3.2* 2.8*   No results for input(s): LIPASE, AMYLASE in the last 168 hours. No results for input(s): AMMONIA in the last 168 hours.  CBC: Recent Labs  Lab 10/15/17 0336 10/16/17 0412 10/17/17 0420 10/18/17 0424 10/20/17 0453  WBC 9.3 8.9 6.2 4.6 5.2  HGB 10.3* 9.7* 9.4* 9.3* 9.6*  HCT 32.1* 29.9* 29.2* 28.6* 30.2*  MCV 86.2 86.0 86.0 86.6 86.5  PLT 202 167 164 136* 136*    Cardiac Enzymes: No results for input(s): CKTOTAL, CKMB, CKMBINDEX, TROPONINI in the last 168 hours.  BNP: Invalid input(s): POCBNP  CBG: Recent Labs  Lab 10/19/17 0841 10/19/17 1140 10/19/17 1640 10/19/17 2107 10/20/17 0736  GLUCAP 123* 136* 192* 120* 118*    Microbiology: Results for orders placed or performed during the hospital encounter of 09/30/17  MRSA PCR Screening     Status: Abnormal   Collection Time: 10/01/17  9:37 AM  Result Value Ref Range Status   MRSA by PCR POSITIVE (A) NEGATIVE Final    Comment:  [name] (FDA approved for NASAL specimens only), is one component of a comprehensive MRSA colonization surveillance program. It is not intended to diagnose MRSA infection nor to guide or monitor treatment for MRSA infections. RESULT CALLED TO, READ BACK BY AND VERIFIED WITH:  Gildardo Pounds AT 1207 10/01/17 SDR Performed at [facility], [address] [place], [place] [address]     Coagulation Studies: No results for input(s): LABPROT, INR in the last 72 hours.  Urinalysis: Recent Labs    10/19/17 0124  COLORURINE YELLOW*  LABSPEC 1.013  PHURINE 6.0  GLUCOSEU NEGATIVE  HGBUR NEGATIVE  BILIRUBINUR NEGATIVE  KETONESUR NEGATIVE  PROTEINUR NEGATIVE  NITRITE NEGATIVE  LEUKOCYTESUR NEGATIVE      Imaging: No results found.   Medications:   . sodium chloride      . amiodarone  200 mg Oral Daily  . apixaban  5 mg Oral BID  . budesonide (PULMICORT) nebulizer solution  0.5 mg Nebulization BID  . carvedilol  3.125 mg Oral BID WC  . chlorhexidine  15 mL Mouth Rinse BID  . gabapentin  300 mg Oral QHS  . insulin aspart  0-15 Units Subcutaneous TID WC  . insulin aspart  0-5 Units Subcutaneous QHS  . ipratropium-albuterol  3 mL Nebulization TID  . mouth rinse  15 mL Mouth Rinse q12n4p  . multivitamin with minerals  1 tablet Oral Daily  . mupirocin ointment   Nasal BID  . pantoprazole  40 mg Oral Daily  . rosuvastatin  10 mg Oral Daily  . sildenafil  40 mg Oral TID  . sodium chloride flush  3 mL Intravenous Q12H  . sodium chloride flush  3 mL Intravenous Q12H  . torsemide  40 mg Oral Daily   sodium chloride, acetaminophen, ALPRAZolam, alum & mag hydroxide-simeth, benzonatate, ipratropium-albuterol, menthol-cetylpyridinium, ondansetron (ZOFRAN) IV, ondansetron, oxyCODONE-acetaminophen, promethazine, sodium chloride flush, sodium chloride flush  Assessment/ Plan:  72 y.o.black female with a PMHx of chronic diastolic heart failure, atrial fibrillation, chronic hypercarbic respiratory failure, COPD, morbid obesity, hyperlipidemia, diabetes mellitus type 2, history of intracranial hemorrhage who was admitted to [facility] on 09/30/2017   1.  Acute renal failure: Acute renal failure secondary to acute cardiorenal syndrome and overdiuresis  Required two treatment of hemodialysis on 4/16 and 4/17. No acute indication for dialysis. Dialysis catheter has been removed.  S Creatinine is close to baseline Outpatient follow up  2. Hypertension with atrial fibrillation: status post cardioversion on 4/22.  Holding home medications.  Acute exacerbation of systolic and diastolic congestive heart failure.  - amiodarone. - Carvedilol - sildenafil for pulmonary hypertension.  - Dopplers negative for DVT. History DVT in the past.   - Cardiac catheterization done this  admission  3.  Pulm HTN - Patient states she is compliant with CPAP for OSA  4. LE edema - Torsemide 40 mg daily - monitor weight at home     LOS: [place] 4/30/201910:11 AM

## 2017-10-20 NOTE — Progress Notes (Signed)
[facility] [place] NAME: Debra Saunders    MR#:  [id number]  DATE OF BIRTH:  [birth year]  SUBJECTIVE:  CHIEF COMPLAINT:   Chief Complaint  Patient presents with  . Shortness of Breath  feeling better, sitting in chair REVIEW OF SYSTEMS:  CONSTITUTIONAL: No fever, fatigue or weakness.  EYES: No blurred or double vision.  EARS, NOSE, AND THROAT: No tinnitus or ear pain.  RESPIRATORY: No cough, shortness of breath, wheezing or hemoptysis.  CARDIOVASCULAR: No chest pain, orthopnea, edema.  GASTROINTESTINAL: No nausea, vomiting, diarrhea or abdominal pain.  GENITOURINARY: No dysuria, hematuria.  ENDOCRINE: No polyuria, nocturia,  HEMATOLOGY: No anemia, easy bruising or bleeding SKIN: No rash or lesion. MUSCULOSKELETAL: No joint pain or arthritis.   NEUROLOGIC: No tingling, numbness, weakness.  PSYCHIATRY: No anxiety or depression.   ROS  DRUG ALLERGIES:   Allergies  Allergen Reactions  . Aspirin Hives and Other (See Comments)    Pt states that she is unable to take because she had bleeding in her brain.    . Other Other (See Comments)    Pt states that she is unable to take blood thinners because she had bleeding in her brain.  Blood Thinners-per doctor at [facility]  . Penicillins Hives, Shortness Of Breath, Swelling and Other (See Comments)    Has patient had a PCN reaction causing immediate rash, facial/tongue/throat swelling, SOB or lightheadedness with hypotension: Yes Has patient had a PCN reaction causing severe rash involving mucus membranes or skin necrosis: No Has patient had a PCN reaction that required hospitalization No Has patient had a PCN reaction occurring within the last 10 years: No If all of the above answers are "NO", then may proceed with Cephalosporin use.    VITALS:  Blood pressure 124/63, pulse (!) 59, temperature 98.6 F (37 C), temperature source Oral, resp. rate 18, height 5\' 1"  (1.549 m), weight 110 kg  (242 lb 8 oz), SpO2 100 %.  PHYSICAL EXAMINATION:  GENERAL:  72 y.o.-year-old patient lying in the bed with no acute distress.  EYES: Pupils equal, round, reactive to light and accommodation. No scleral icterus. Extraocular muscles intact.  HEENT: Head atraumatic, normocephalic. Oropharynx and nasopharynx clear.  NECK:  Supple, no jugular venous distention. No thyroid enlargement, no tenderness.  LUNGS: Normal breath sounds bilaterally, no wheezing, rales,rhonchi or crepitation. No use of accessory muscles of respiration.  CARDIOVASCULAR: S1, S2 normal. No murmurs, rubs, or gallops.  ABDOMEN: Soft, nontender, nondistended. Bowel sounds present. No organomegaly or mass.  EXTREMITIES: No pedal edema, cyanosis, or clubbing.  NEUROLOGIC: Cranial nerves II through XII are intact. Muscle strength 5/5 in all extremities. Sensation intact. Gait not checked.  PSYCHIATRIC: The patient is alert and oriented x 3.  SKIN: No obvious rash, lesion, or ulcer.   Physical Exam LABORATORY PANEL:   CBC Recent Labs  Lab 10/20/17 0453  WBC 5.2  HGB 9.6*  HCT 30.2*  PLT 136*   ------------------------------------------------------------------------------------------------------------------  Chemistries  Recent Labs  Lab 10/17/17 0420  10/20/17 0453  NA 139   < > 140  K 4.3   < > 3.3*  CL 105   < > 101  CO2 29   < > 31  GLUCOSE 112*   < > 135*  BUN 75*   < > 26*  CREATININE 1.48*   < > 0.97  CALCIUM 9.1   < > 8.7*  MG 1.9  --   --    < > =  values in this interval not displayed.   ------------------------------------------------------------------------------------------------------------------  Cardiac Enzymes No results for input(s): TROPONINI in the last 168 hours. ------------------------------------------------------------------------------------------------------------------  RADIOLOGY:  No results found.  ASSESSMENT AND PLAN:  * Acute on chronic respiratory failure Secondary to  acute on chronic diastolic CHF with flash pulmonary edema Continue torsemide On 4 L continuous at home which is her baseline  *Acute on chronic combined systolic and diastolic CHF:  New Cardiomyopathy concerning for ischemic cardiomyopathy EF was 40 to 45% by echo.  Cardiac catheterization on 4/29 showed no significant coronary artery disease. Continue treatment with carvedilol and torsemide 20 mg once daily   *Persistent AF: Cardioversion this admission  normal sinus rhythm restored  Continue amiodarone 200 mg once daily. Eliquis to be resumed tomorrow morning per cardio  *Severe pulmonary hypertension: - Cont sildenafil.  Narrow window between renal dysfunction and shortness of breath. - Cardio Recommends referral to the advanced heart failure clinic in [place] - patient in agreement  *Acute NSTEMI - Compounded by Cardiomyopathy EF 40-45% - no significant CAD on cath  *Acute renal failure with CKD stage 3 - Likely component of ATN Renal function is back to baseline. Did require 2 hemodialysis treatments Nephrology input appreciated  *Acute epistaxis  Resolved   *Chronic morbid obesity  Most likely secondary to excess calories  Lifestyle modification recommended   *Acute on COPD exacerbation Resolved  *Hyperlipidemia, unspecified Stable on Crestor  *Chronic diabetes mellitus type 2 Stable on current regiment    needs STR.SNF at [place] - [facility]   All the records are reviewed and case discussed with Care Management/Social Workerr. Management plans discussed with the patient, nursing and they are in agreement.  CODE STATUS: full  TOTAL TIME TAKING CARE OF THIS PATIENT: 35 minutes.     POSSIBLE D/C IN 1 DAYS, DEPENDING ON CLINICAL CONDITION.   Max Sane M.D on 10/20/2017   Between 7am to 6pm - Pager - [phone]  After 6pm go to [email or web address] - password [email or web address] [facility]   Office  [phone]  CC: Primary care physician; [name], MD  Note: This dictation was prepared with Dragon dictation along with smaller phrase technology. Any transcriptional errors that result from this process are unintentional.

## 2017-10-20 NOTE — Progress Notes (Signed)
* [facility]     Assessment and Plan:  Systolic, diastolic combined congestive heart failure. Atrial fibrillation status post cardioversion. Oliguric renal failure with volume overload. Pulmonary edema Obstructive sleep apnea History of pulmonary embolism, complicated by [facility] on anticoagulation.  Pulmonary hypertension  - Continue management of heart failure and renal failure, volume overload. - Continue sildenafil, will need outpatient follow-up for further titration of therapies for pulmonary hypertension.  Patient was previously on Letairis, but then this was no longer covered by insurance, and she was switched to sildenafil. Ideally this patient should be on a regimen of both Adcirca and Letairis.  - Nightly CPAP for obstructive sleep apnea.  At a pressure of 13.  Pulmonary service will sign off for now.    Date: 10/20/2017  MRN# [id number] Debra Saunders 06-07-1946   Debra Saunders is a 72 y.o. old female seen in follow up for chief complaint of  Chief Complaint  Patient presents with  . Shortness of Breath     Synopsis Patient is a 72 year old female with a history of chronic combined systolic and diastolic heart failure, A. 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  I have seen her in the outpatient setting where he was noted she has a history of pulmonary hypertension maintained on Letairis.  She was first diagnosed with pulmonary hypertension 2004, thought to be multifactorial due to congestive heart failure, thromboembolic disease, as well as pulmonary disease.  She was also noted to have obstructive sleep apnea, recommended to be on CPAP at 13.  Currently the patient has no new complaints.  She underwent right and left heart catheter earlier today, this was consistent with both cardiac dysfunction, as well as severe  pulmonary hypertension.  Significant events: 4/10 patient presented to the emergency department on due to increased shortness of breath and weight gain over the past few weeks, combined with lower extremity and abdominal swelling.  She was diagnosed with volume overload and CHF, and started on diuresis.   10/01/2017 echocardiogram showed normal ejection fraction with a PA pressure estimated at 69 mmHg. 4/16 subsequently her course was complicated by COPD exacerbation, oliguric renal failure, initiated on hemodialysis. 4/18 the patient developed respiratory distress, was placed on BiPAP and transferred to the intensive care unit.  Chest x-ray at that time suggest pulmonary edema, patient was diuresed further and had subsequent improvement,  4/21 she was then transferred back to general medical floor.  4/22 she under went cardioversion for atrial fibrillation.. 4/23 patient experienced a significant nosebleed; echo on 4/23 shows EF of 40%, pulmonary artery pressure of 65. 4/25 patient was seen by palliative care service, she wished to maintain her full CODE STATUS consistent with my previous discussions with her in the office. 4/29; right and left heart cath; RA pressure: 23 mmHg, RV pressure: 84 over 13 mmHg, PA pressure:                  84/29 mmHg pulmonary capillary wedge pressure is 20 mmHg, LVEDP was 18 mmHg, cardiac                   output: 3.89 with a cardiac index of 1.89.  Subjective: Pt looks and feels better today, she is sitting up today. No new complaints.    Medication:    Current Facility-Administered Medications:  .  0.9 %  sodium chloride infusion, 250 mL,  Intravenous, PRN, Wellington Hampshire, MD .  acetaminophen (TYLENOL) tablet 650 mg, 650 mg, Oral, Q4H PRN, [name] A, MD, 650 mg at 10/19/17 1105 .  ALPRAZolam Duanne Moron) tablet 0.25 mg, 0.25 mg, Oral, BID PRN, [name] A, MD, 0.25 mg at 10/16/17 2125 .  alum & mag hydroxide-simeth (MAALOX/MYLANTA) 200-200-20 MG/5ML  suspension 30 mL, 30 mL, Oral, Q6H PRN, Fletcher Anon, Muhammad A, MD, 30 mL at 10/17/17 2121 .  amiodarone (PACERONE) tablet 200 mg, 200 mg, Oral, Daily, [name] A, MD, 200 mg at 10/20/17 0911 .  apixaban (ELIQUIS) tablet 5 mg, 5 mg, Oral, BID, Fletcher Anon, Muhammad A, MD, 5 mg at 10/20/17 0911 .  benzonatate (TESSALON) capsule 100 mg, 100 mg, Oral, TID PRN, Wellington Hampshire, MD, 100 mg at 10/18/17 2109 .  budesonide (PULMICORT) nebulizer solution 0.5 mg, 0.5 mg, Nebulization, BID, Arida, Muhammad A, MD, 0.5 mg at 10/20/17 0840 .  carvedilol (COREG) tablet 3.125 mg, 3.125 mg, Oral, BID WC, Wellington Hampshire, MD, Stopped at 10/20/17 0800 .  chlorhexidine (PERIDEX) 0.12 % solution 15 mL, 15 mL, Mouth Rinse, BID, Arida, Muhammad A, MD, 15 mL at 10/20/17 1000 .  gabapentin (NEURONTIN) capsule 300 mg, 300 mg, Oral, QHS, Arida, Muhammad A, MD, 300 mg at 10/19/17 2236 .  insulin aspart (novoLOG) injection 0-15 Units, 0-15 Units, Subcutaneous, TID WC, Wellington Hampshire, MD, 3 Units at 10/20/17 1208 .  insulin aspart (novoLOG) injection 0-5 Units, 0-5 Units, Subcutaneous, QHS, Wellington Hampshire, MD, 4 Units at 10/13/17 2200 .  ipratropium-albuterol (DUONEB) 0.5-2.5 (3) MG/3ML nebulizer solution 3 mL, 3 mL, Nebulization, Q4H PRN, [name] A, MD, 3 mL at 10/08/17 1724 .  ipratropium-albuterol (DUONEB) 0.5-2.5 (3) MG/3ML nebulizer solution 3 mL, 3 mL, Nebulization, TID, Arida, Muhammad A, MD, 3 mL at 10/20/17 1421 .  MEDLINE mouth rinse, 15 mL, Mouth Rinse, q12n4p, Arida, Muhammad A, MD, 15 mL at 10/20/17 1210 .  menthol-cetylpyridinium (CEPACOL) lozenge 3 mg, 1 lozenge, Oral, PRN, Wellington Hampshire, MD, 3 mg at 10/12/17 0845 .  multivitamin with minerals tablet 1 tablet, 1 tablet, Oral, Daily, Wellington Hampshire, MD, 1 tablet at 10/20/17 0910 .  mupirocin ointment (BACTROBAN) 2 %, , Nasal, BID, Arida, Muhammad A, MD .  ondansetron (ZOFRAN) injection 4 mg, 4 mg, Intravenous, Q6H PRN, [name] A, MD, 4  mg at 10/03/17 1213 .  ondansetron (ZOFRAN) tablet 8 mg, 8 mg, Oral, Q8H PRN, Arida, Muhammad A, MD .  oxyCODONE-acetaminophen (PERCOCET/ROXICET) 5-325 MG per tablet 1-2 tablet, 1-2 tablet, Oral, Q6H PRN, Fletcher Anon, Muhammad A, MD .  pantoprazole (PROTONIX) EC tablet 40 mg, 40 mg, Oral, Daily, [name] A, MD, 40 mg at 10/20/17 0910 .  promethazine (PHENERGAN) tablet 25 mg, 25 mg, Oral, Q4H PRN, Arida, Muhammad A, MD .  rosuvastatin (CRESTOR) tablet 10 mg, 10 mg, Oral, Daily, Fletcher Anon, Muhammad A, MD, 10 mg at 10/20/17 0911 .  sildenafil (REVATIO) tablet 40 mg, 40 mg, Oral, TID, Dunn, Ryan M, PA-C, 40 mg at 10/20/17 0915 .  sodium chloride flush (NS) 0.9 % injection 3 mL, 3 mL, Intravenous, Q12H, Arida, Muhammad A, MD, 3 mL at 10/20/17 1240 .  sodium chloride flush (NS) 0.9 % injection 3 mL, 3 mL, Intravenous, PRN, Arida, Muhammad A, MD .  sodium chloride flush (NS) 0.9 % injection 3 mL, 3 mL, Intravenous, Q12H, Arida, Muhammad A, MD, 3 mL at 10/20/17 1239 .  sodium chloride flush (NS) 0.9 % injection 3 mL, 3 mL,  Intravenous, PRN, Wellington Hampshire, MD .  torsemide (DEMADEX) tablet 40 mg, 40 mg, Oral, Daily, Candiss Norse, Harmeet, MD, 20 mg at 10/20/17 1205   Allergies:  Aspirin; Other; and Penicillins  Review of Systems: Patient denies hemoptysis, further epistaxis, rhinitis. Other:  All other systems were reviewed and found to be negative other than what is mentioned in the HPI.   Physical Examination:   VS: BP 125/67 (BP Location: Right Arm)   Pulse 64   Temp 97.9 F (36.6 C)   Resp 18   Ht 5\' 1"  (1.549 m)   Wt 242 lb 8 oz (110 kg)   SpO2 95%   BMI 45.82 kg/m    General Appearance: No distress  Neuro:without focal findings,  speech normal,  HEENT: PERRLA, EOM intact. Pulmonary: normal breath sounds, No wheezing.   CardiovascularNormal S1,S2.  No m/r/g.   Abdomen: Benign, Soft, non-tender. Renal:  No costovertebral tenderness  GU:  Not performed at this time. Endoc: No evident  thyromegaly, no signs of acromegaly. Skin:   warm, no rash. Extremities: normal, no cyanosis, clubbing.   LABORATORY PANEL:   CBC Recent Labs  Lab 10/20/17 0453  WBC 5.2  HGB 9.6*  HCT 30.2*  PLT 136*   ------------------------------------------------------------------------------------------------------------------  Chemistries  Recent Labs  Lab 10/17/17 0420  10/20/17 0453  NA 139   < > 140  K 4.3   < > 3.3*  CL 105   < > 101  CO2 29   < > 31  GLUCOSE 112*   < > 135*  BUN 75*   < > 26*  CREATININE 1.48*   < > 0.97  CALCIUM 9.1   < > 8.7*  MG 1.9  --   --    < > = values in this interval not displayed.   ------------------------------------------------------------------------------------------------------------------  Cardiac Enzymes No results for input(s): TROPONINI in the last 168 hours. ------------------------------------------------------------  RADIOLOGY:   No results found for this or any previous visit. Results for orders placed during the hospital encounter of 09/30/17  DG Chest 2 View   Narrative CLINICAL DATA:  Shortness of breath and weight gain of 17 pounds over the past several weeks. Suspect CHF exacerbation. History of atrial fibrillation, CHF, COPD, former smoker.  EXAM: CHEST - 2 VIEW  COMPARISON:  Portable chest x-ray of August 25, 2017  FINDINGS: The right hemidiaphragm remains higher than the left. The right lung is hypoinflated. The left lung is better inflated. The interstitial markings are increased bilaterally with patchy areas of airspace opacity. The cardiac silhouette is enlarged. The pulmonary vascularity is engorged and indistinct. There are calcifications in the wall of the aortic arch. The ICD is in stable position. There is no significant pleural effusion.  IMPRESSION: Increased interstitial markings bilaterally consistent with interstitial and early alveolar edema. Certainly pneumonia is not entirely excluded. No  significant pleural effusion.  Thoracic aortic atherosclerosis.   Electronically Signed   By: David  Martinique M.D.   On: 09/30/2017 16:14    ------------------------------------------------------------------------------------------------------------------  Thank  you for allowing [facility] to assist in the care of your patient. Our recommendations are noted above.  Please contact us if we can be of further service.   Marda Stalker, MD.  [facility] Office Number: [phone]  Patricia Pesa, M.D.  Merton Border, M.D  10/20/2017

## 2017-10-20 NOTE — Progress Notes (Signed)
To [place] at 4l after neb tx

## 2017-10-21 LAB — CBC
HCT: 31.7 % — ABNORMAL LOW (ref 35.0–47.0)
Hemoglobin: 10.3 g/dL — ABNORMAL LOW (ref 12.0–16.0)
MCH: 27.9 pg (ref 26.0–34.0)
MCHC: 32.3 g/dL (ref 32.0–36.0)
MCV: 86.3 fL (ref 80.0–100.0)
Platelets: 130 10*3/uL — ABNORMAL LOW (ref 150–440)
RBC: 3.68 MIL/uL — ABNORMAL LOW (ref 3.80–5.20)
RDW: 17.5 % — ABNORMAL HIGH (ref 11.5–14.5)
WBC: 5.2 10*3/uL (ref 3.6–11.0)

## 2017-10-21 LAB — GLUCOSE, CAPILLARY
Glucose-Capillary: 113 mg/dL — ABNORMAL HIGH (ref 65–99)
Glucose-Capillary: 170 mg/dL — ABNORMAL HIGH (ref 65–99)
Glucose-Capillary: 158 mg/dL — ABNORMAL HIGH (ref 65–99)
Glucose-Capillary: 148 mg/dL — ABNORMAL HIGH (ref 65–99)

## 2017-10-21 LAB — BASIC METABOLIC PANEL
Anion gap: 6 (ref 5–15)
BUN: 20 mg/dL (ref 6–20)
CO2: 35 mmol/L — ABNORMAL HIGH (ref 22–32)
Calcium: 8.5 mg/dL — ABNORMAL LOW (ref 8.9–10.3)
Chloride: 99 mmol/L — ABNORMAL LOW (ref 101–111)
Creatinine, Ser: 1.01 mg/dL — ABNORMAL HIGH (ref 0.44–1.00)
GFR calc Af Amer: >60 mL/min (ref >60)
GFR calc non Af Amer: 53 mL/min — ABNORMAL LOW (ref >60)
Glucose, Bld: 109 mg/dL — ABNORMAL HIGH (ref 65–99)
Potassium: 2.7 mmol/L — CL (ref 3.5–5.1)
Sodium: 140 mmol/L (ref 135–145)

## 2017-10-21 LAB — POTASSIUM: Potassium: 4 mmol/L (ref 3.5–5.1)

## 2017-10-21 MED ORDER — SPIRONOLACTONE 25 MG PO TABS
12.5000 mg | ORAL_TABLET | Freq: Every day | ORAL | Status: DC
  Administered 2017-10-21 – 2017-10-22 (×2): 12.5 mg via ORAL
  Filled 2017-10-21: qty 1
  Filled 2017-10-21: qty 0.5
  Filled 2017-10-21 (×2): qty 1

## 2017-10-21 MED ORDER — POTASSIUM CHLORIDE 10 MEQ/100ML IV SOLN
10.0000 meq | INTRAVENOUS | Status: DC
  Filled 2017-10-21 (×2): qty 100

## 2017-10-21 MED ORDER — SODIUM CHLORIDE 0.9 % IV SOLN
INTRAVENOUS | Status: AC
  Administered 2017-10-21: 11:00:00 via INTRAVENOUS

## 2017-10-21 MED ORDER — POTASSIUM CHLORIDE CRYS ER 20 MEQ PO TBCR
40.0000 meq | EXTENDED_RELEASE_TABLET | Freq: Two times a day (BID) | ORAL | Status: DC
  Administered 2017-10-21: 40 meq via ORAL
  Filled 2017-10-21: qty 2

## 2017-10-21 MED ORDER — POTASSIUM CHLORIDE CRYS ER 20 MEQ PO TBCR: 40.0000 meq | EXTENDED_RELEASE_TABLET | Freq: Three times a day (TID) | ORAL | Status: DC

## 2017-10-21 MED ORDER — POTASSIUM CHLORIDE CRYS ER 20 MEQ PO TBCR
40.0000 meq | EXTENDED_RELEASE_TABLET | Freq: Three times a day (TID) | ORAL | Status: AC
  Administered 2017-10-21 (×3): 40 meq via ORAL
  Filled 2017-10-21 (×3): qty 2

## 2017-10-21 MED ORDER — FUROSEMIDE 40 MG PO TABS
40.0000 mg | ORAL_TABLET | Freq: Every day | ORAL | Status: DC
  Administered 2017-10-22: 40 mg via ORAL
  Filled 2017-10-21: qty 1

## 2017-10-21 NOTE — Progress Notes (Signed)
[place] [facility] [place] NAME: Debra Saunders    MR#:  [id number]  DATE OF BIRTH:  10/28/1945  SUBJECTIVE:  CHIEF COMPLAINT:   Chief Complaint  Patient presents with  . Shortness of Breath  was hypotensive earlier and dizzi but better now, waiting for insurance auth REVIEW OF SYSTEMS:  CONSTITUTIONAL: No fever, fatigue or weakness.  EYES: No blurred or double vision.  EARS, NOSE, AND THROAT: No tinnitus or ear pain.  RESPIRATORY: No cough, shortness of breath, wheezing or hemoptysis.  CARDIOVASCULAR: No chest pain, orthopnea, edema.  GASTROINTESTINAL: No nausea, vomiting, diarrhea or abdominal pain.  GENITOURINARY: No dysuria, hematuria.  ENDOCRINE: No polyuria, nocturia,  HEMATOLOGY: No anemia, easy bruising or bleeding SKIN: No rash or lesion. MUSCULOSKELETAL: No joint pain or arthritis.   NEUROLOGIC: No tingling, numbness, weakness.  PSYCHIATRY: No anxiety or depression.   ROS  DRUG ALLERGIES:   Allergies  Allergen Reactions  . Aspirin Hives and Other (See Comments)    Pt states that she is unable to take because she had bleeding in her brain.    . Other Other (See Comments)    Pt states that she is unable to take blood thinners because she had bleeding in her brain.  Blood Thinners-per doctor at [facility]  . Penicillins Hives, Shortness Of Breath, Swelling and Other (See Comments)    Has patient had a PCN reaction causing immediate rash, facial/tongue/throat swelling, SOB or lightheadedness with hypotension: Yes Has patient had a PCN reaction causing severe rash involving mucus membranes or skin necrosis: No Has patient had a PCN reaction that required hospitalization No Has patient had a PCN reaction occurring within the last 10 years: No If all of the above answers are "NO", then may proceed with Cephalosporin use.    VITALS:  Blood pressure (!) 110/57, pulse 60, temperature 98.2 F (36.8 C), temperature source Oral, resp.  rate 17, height 5\' 1"  (1.549 m), weight 105.1 kg (231 lb 12.8 oz), SpO2 100 %.  PHYSICAL EXAMINATION:  GENERAL:  72 y.o.-year-old patient lying in the bed with no acute distress.  EYES: Pupils equal, round, reactive to light and accommodation. No scleral icterus. Extraocular muscles intact.  HEENT: Head atraumatic, normocephalic. Oropharynx and nasopharynx clear.  NECK:  Supple, no jugular venous distention. No thyroid enlargement, no tenderness.  LUNGS: Normal breath sounds bilaterally, no wheezing, rales,rhonchi or crepitation. No use of accessory muscles of respiration.  CARDIOVASCULAR: S1, S2 normal. No murmurs, rubs, or gallops.  ABDOMEN: Soft, nontender, nondistended. Bowel sounds present. No organomegaly or mass.  EXTREMITIES: No pedal edema, cyanosis, or clubbing.  NEUROLOGIC: Cranial nerves II through XII are intact. Muscle strength 5/5 in all extremities. Sensation intact. Gait not checked.  PSYCHIATRIC: The patient is alert and oriented x 3.  SKIN: No obvious rash, lesion, or ulcer.   Physical Exam LABORATORY PANEL:   CBC Recent Labs  Lab 10/21/17 0510  WBC 5.2  HGB 10.3*  HCT 31.7*  PLT 130*   ------------------------------------------------------------------------------------------------------------------  Chemistries  Recent Labs  Lab 10/17/17 0420  10/21/17 0510 10/21/17 1216  NA 139   < > 140  --   K 4.3   < > 2.7* 4.0  CL 105   < > 99*  --   CO2 29   < > 35*  --   GLUCOSE 112*   < > 109*  --   BUN 75*   < > 20  --   CREATININE 1.48*   < >  1.01*  --   CALCIUM 9.1   < > 8.5*  --   MG 1.9  --   --   --    < > = values in this interval not displayed.   ------------------------------------------------------------------------------------------------------------------  Cardiac Enzymes No results for input(s): TROPONINI in the last 168  hours. ------------------------------------------------------------------------------------------------------------------  RADIOLOGY:  No results found.  ASSESSMENT AND PLAN:  * Acute on chronic respiratory failure Secondary to acute on chronic diastolic CHF with flash pulmonary edema Continue torsemide On 4 L continuous at home which is her baseline  *Acute on chronic combined systolic and diastolic CHF:  New Cardiomyopathy concerning for ischemic cardiomyopathy EF was 40 to 45% by echo.  Cardiac catheterization on 4/29 showed no significant coronary artery disease. Continue treatment with carvedilol and torsemide 20 mg once daily   *Persistent AF: Cardioversion this admission  normal sinus rhythm restored  Continue amiodarone 200 mg once daily. Eliquis to be resumed tomorrow morning per cardio  *Severe pulmonary hypertension: - Cont sildenafil.  Narrow window between renal dysfunction and shortness of breath. - Cardio Recommends referral to the advanced heart failure clinic in [place] - patient in agreement  *Acute NSTEMI - Compounded by Cardiomyopathy EF 40-45% - no significant CAD on cath  *Acute renal failure with CKD stage 3 - Likely component of ATN Renal function is back to baseline. Did require 2 hemodialysis treatments Nephrology input appreciated  *Acute epistaxis  Resolved   *Chronic morbid obesity  Most likely secondary to excess calories  Lifestyle modification recommended   *Acute on COPD exacerbation Resolved  *Hyperlipidemia, unspecified Stable on Crestor  *Chronic diabetes mellitus type 2 Stable on current regiment  * Hypotension: held HTN meds and resolved.    needs STR.SNF at [place] - plan for [facility] once insurance auth - still no insurance auth   All the records are reviewed and case discussed with Care Management/Social Workerr. Management plans discussed with the patient, nursing and they are in  agreement.  CODE STATUS: full  TOTAL TIME TAKING CARE OF THIS PATIENT: 25 minutes.     POSSIBLE D/C IN 1 DAYS, DEPENDING ON CLINICAL CONDITION.   Max Sane M.D on 10/21/2017   Between 7am to 6pm - Pager - [phone]  After 6pm go to [email or web address] - password [email or web address] [facility]  Office  [phone]  CC: Primary care physician; [name], MD  Note: This dictation was prepared with Dragon dictation along with smaller phrase technology. Any transcriptional errors that result from this process are unintentional.

## 2017-10-21 NOTE — Progress Notes (Signed)
Potassium value paged to on call MD.

## 2017-10-21 NOTE — Clinical Social Work Note (Addendum)
CSW spoke to [employer] and they have not received insurance authorization yet.  CSW updated physician and case Freight forwarder.  Jones Broom. Norval Morton, MSW, [place]  10/21/2017 4:33 PM

## 2017-10-21 NOTE — Progress Notes (Signed)
[facility]  ROUNDING NOTE   Subjective:   Patient underwent cardiac cath this admission.  She was noted to have severe pulmonary hypertension Has now returned to normal at 0.97.  Potassium is  low at 2.7 Brisk UOP. Almost 10 L in last 2 days Dizzy this am She continues to have some lower extremity edema Breathing better   Objective:  Vital signs in last 24 hours:  Temp:  [98.1 F (36.7 C)-98.6 F (37 C)] 98.1 F (36.7 C) (05/01 0421) Pulse Rate:  [59-68] 63 (05/01 0920) Resp:  [17-19] 19 (05/01 0729) BP: (99-127)/(50-73) 99/50 (05/01 0920) SpO2:  [95 %-100 %] 100 % (05/01 0729) Weight:  [231 lb 12.8 oz (105.1 kg)] 231 lb 12.8 oz (105.1 kg) (05/01 0421)  Weight change: -12 lb 3.2 oz (-5.534 kg) Filed Weights   10/19/17 0835 10/20/17 0352 10/21/17 0421  Weight: 244 lb (110.7 kg) 242 lb 8 oz (110 kg) 231 lb 12.8 oz (105.1 kg)    Intake/Output: I/O last 3 completed shifts: In: 6 [I.V.:6] Out: 3750 [Urine:3750]   Intake/Output this shift:  No intake/output data recorded.  Physical Exam: General:  No acute distress  Head: Normocephalic, atraumatic.   Eyes: Anicteric  Neck: Supple, trachea midline  Lungs:  Clear b/l  Heart: S1S2 no rubs, S3 gallop  Abdomen:  Soft, nontender, bowel sounds present  Extremities: 1-2+peripheral edema.   Neurologic: Awake, alert, following commands  Skin: warm       Basic Metabolic Panel: Recent Labs  Lab 10/14/17 1029 10/15/17 0336 10/16/17 0413 10/17/17 0420 10/19/17 0448 10/20/17 0453 10/21/17 0510  NA 135 TEST WILL BE CREDITED  135 137 139 141 140 140  K 4.6 TEST WILL BE CREDITED  4.7 4.2 4.3 3.9 3.3* 2.7*  CL 101 TEST WILL BE CREDITED  101 103 105 105 101 99*  CO2 25 TEST WILL BE CREDITED  26 29 29 29 31  35*  GLUCOSE 233* TEST WILL BE CREDITED  247* 148* 112* 134* 135* 109*  BUN 103* TEST WILL BE CREDITED  99* 94* 75* 31* 26* 20  CREATININE 2.26* TEST WILL BE CREDITED  2.07* 1.70* 1.48* 1.02* 0.97  1.01*  CALCIUM 9.6 TEST WILL BE CREDITED  9.3 9.0 9.1 8.8* 8.7* 8.5*  MG  --   --   --  1.9  --   --   --   PHOS 5.6* 5.0*  --   --  3.1  --   --     Liver Function Tests: Recent Labs  Lab 10/14/17 1029 10/15/17 0336 10/19/17 0448  ALBUMIN 3.3* 3.2* 2.8*   No results for input(s): LIPASE, AMYLASE in the last 168 hours. No results for input(s): AMMONIA in the last 168 hours.  CBC: Recent Labs  Lab 10/16/17 0412 10/17/17 0420 10/18/17 0424 10/20/17 0453 10/21/17 0510  WBC 8.9 6.2 4.6 5.2 5.2  HGB 9.7* 9.4* 9.3* 9.6* 10.3*  HCT 29.9* 29.2* 28.6* 30.2* 31.7*  MCV 86.0 86.0 86.6 86.5 86.3  PLT 167 164 136* 136* 130*    Cardiac Enzymes: No results for input(s): CKTOTAL, CKMB, CKMBINDEX, TROPONINI in the last 168 hours.  BNP: Invalid input(s): POCBNP  CBG: Recent Labs  Lab 10/20/17 0736 10/20/17 1113 10/20/17 1621 10/20/17 2111 10/21/17 0727  GLUCAP 118* 170* 159* 128* 113*    Microbiology: Results for orders placed or performed during the hospital encounter of 09/30/17  MRSA PCR Screening     Status: Abnormal   Collection Time: 10/01/17  9:37  AM  Result Value Ref Range Status   MRSA by PCR POSITIVE (A) NEGATIVE Final    Comment:        [name] (FDA approved for NASAL specimens only), is one component of a comprehensive MRSA colonization surveillance program. It is not intended to diagnose MRSA infection nor to guide or monitor treatment for MRSA infections. RESULT CALLED TO, READ BACK BY AND VERIFIED WITH:  Gildardo Pounds AT 1207 10/01/17 SDR Performed at [facility], [address] [place], [place] [address]     Coagulation Studies: No results for input(s): LABPROT, INR in the last 72 hours.  Urinalysis: Recent Labs    10/19/17 0124  COLORURINE YELLOW*  LABSPEC 1.013  PHURINE 6.0  GLUCOSEU NEGATIVE  HGBUR NEGATIVE  BILIRUBINUR NEGATIVE  KETONESUR NEGATIVE  PROTEINUR NEGATIVE  NITRITE NEGATIVE  LEUKOCYTESUR  NEGATIVE      Imaging: No results found.   Medications:   . sodium chloride     . amiodarone  200 mg Oral Daily  . apixaban  5 mg Oral BID  . budesonide (PULMICORT) nebulizer solution  0.5 mg Nebulization BID  . carvedilol  3.125 mg Oral BID WC  . chlorhexidine  15 mL Mouth Rinse BID  . furosemide  40 mg Oral Daily  . gabapentin  300 mg Oral QHS  . insulin aspart  0-15 Units Subcutaneous TID WC  . insulin aspart  0-5 Units Subcutaneous QHS  . ipratropium-albuterol  3 mL Nebulization TID  . mouth rinse  15 mL Mouth Rinse q12n4p  . multivitamin with minerals  1 tablet Oral Daily  . mupirocin ointment   Nasal BID  . pantoprazole  40 mg Oral Daily  . potassium chloride  40 mEq Oral TID  . rosuvastatin  10 mg Oral Daily  . sildenafil  40 mg Oral TID  . sodium chloride flush  3 mL Intravenous Q12H  . sodium chloride flush  3 mL Intravenous Q12H  . spironolactone  12.5 mg Oral Daily   sodium chloride, acetaminophen, ALPRAZolam, alum & mag hydroxide-simeth, benzonatate, ipratropium-albuterol, menthol-cetylpyridinium, ondansetron (ZOFRAN) IV, ondansetron, oxyCODONE-acetaminophen, promethazine, sodium chloride flush, sodium chloride flush  Assessment/ Plan:  72 y.o.black female with a PMHx of chronic diastolic heart failure, atrial fibrillation, chronic hypercarbic respiratory failure, COPD, morbid obesity, hyperlipidemia, diabetes mellitus type 2, history of intracranial hemorrhage who was admitted to [facility] on 09/30/2017   1.  Acute renal failure: Acute renal failure secondary to acute cardiorenal syndrome and overdiuresis  Required two treatment of hemodialysis on 4/16 and 4/17. No acute indication for dialysis. Dialysis catheter has been removed.  S Creatinine is close to baseline Outpatient follow up  2. Hypertension with atrial fibrillation: status post cardioversion on 4/22.  Holding home medications.  Acute exacerbation of systolic and diastolic congestive heart failure.  -  amiodarone. - Carvedilol - sildenafil for pulmonary hypertension. - may be causing dizziness. Dose to be adjusted by cardiology - Dopplers negative for DVT. History DVT in the past.   - Cardiac catheterization done this admission  3.  Pulm HTN - Patient states she is compliant with CPAP for OSA - outpatient follow up  4. LE edema - Torsemide changed to lasix 40 mg daily by cardiologist - monitor weight at home  5.Hypo[place]ia - replace PRN     LOS: 21 Debra Saunders 5/1/20199:53 AM

## 2017-10-21 NOTE — Progress Notes (Signed)
Progress Note  Patient Name: Debra Saunders Date of Encounter: 10/21/2017  Primary Cardiologist: Fletcher Anon  Subjective   Breathing is better this AM. Renal function trending slightly upwards. Weight down 11 pounds over the past 24 hours. Lower extremity improving, though still remains. No chest pain. Potassium low this morning at 2.7. Some positional dizziness.   Inpatient Medications    Scheduled Meds: . amiodarone  200 mg Oral Daily  . apixaban  5 mg Oral BID  . budesonide (PULMICORT) nebulizer solution  0.5 mg Nebulization BID  . carvedilol  3.125 mg Oral BID WC  . chlorhexidine  15 mL Mouth Rinse BID  . furosemide  40 mg Oral Daily  . gabapentin  300 mg Oral QHS  . insulin aspart  0-15 Units Subcutaneous TID WC  . insulin aspart  0-5 Units Subcutaneous QHS  . ipratropium-albuterol  3 mL Nebulization TID  . mouth rinse  15 mL Mouth Rinse q12n4p  . multivitamin with minerals  1 tablet Oral Daily  . mupirocin ointment   Nasal BID  . pantoprazole  40 mg Oral Daily  . potassium chloride  40 mEq Oral TID  . rosuvastatin  10 mg Oral Daily  . sildenafil  40 mg Oral TID  . sodium chloride flush  3 mL Intravenous Q12H  . sodium chloride flush  3 mL Intravenous Q12H  . spironolactone  12.5 mg Oral Daily   Continuous Infusions: . sodium chloride     PRN Meds: sodium chloride, acetaminophen, ALPRAZolam, alum & mag hydroxide-simeth, benzonatate, ipratropium-albuterol, menthol-cetylpyridinium, ondansetron (ZOFRAN) IV, ondansetron, oxyCODONE-acetaminophen, promethazine, sodium chloride flush, sodium chloride flush   Vital Signs    Vitals:   10/20/17 2023 10/20/17 2216 10/21/17 0421 10/21/17 0729  BP:   127/73 (!) 107/55  Pulse:  68 62 65  Resp:  18 17 19   Temp:   98.1 F (36.7 C)   TempSrc:      SpO2: 97% 97% 96% 100%  Weight:   231 lb 12.8 oz (105.1 kg)   Height:        Intake/Output Summary (Last 24 hours) at 10/21/2017 0742 Last data filed at 10/21/2017 0300 Gross per  24 hour  Intake -  Output 2850 ml  Net -2850 ml   Filed Weights   10/19/17 0835 10/20/17 0352 10/21/17 0421  Weight: 244 lb (110.7 kg) 242 lb 8 oz (110 kg) 231 lb 12.8 oz (105.1 kg)    Telemetry    NSR with intermittent pacing - Personally Reviewed  ECG    n/a - Personally Reviewed  Physical Exam   GEN: No acute distress.   Neck: No JVD. Cardiac: RRR, no murmurs, rubs, or gallops.  Respiratory: Diminished breath sounds bilaterally.  GI: Soft, nontender, non-distended.   MS: 1-2+ bilateral lower extremity edema; No deformity. Neuro:  Alert and oriented x 3; Nonfocal.  Psych: Normal affect.  Labs    Chemistry Recent Labs  Lab 10/14/17 1029 10/15/17 0336  10/19/17 0448 10/20/17 0453 10/21/17 0510  NA 135 TEST WILL BE CREDITED  135   < > 141 140 140  K 4.6 TEST WILL BE CREDITED  4.7   < > 3.9 3.3* 2.7*  CL 101 TEST WILL BE CREDITED  101   < > 105 101 99*  CO2 25 TEST WILL BE CREDITED  26   < > 29 31 35*  GLUCOSE 233* TEST WILL BE CREDITED  247*   < > 134* 135* 109*  BUN 103* TEST  WILL BE CREDITED  99*   < > 31* 26* 20  CREATININE 2.26* TEST WILL BE CREDITED  2.07*   < > 1.02* 0.97 1.01*  CALCIUM 9.6 TEST WILL BE CREDITED  9.3   < > 8.8* 8.7* 8.5*  ALBUMIN 3.3* 3.2*  --  2.8*  --   --   GFRNONAA 20* NOT CALCULATED  22*   < > 53* 56* 53*  GFRAA 23* NOT CALCULATED  26*   < > >60 >60 >60  ANIONGAP 9 TEST WILL BE CREDITED  8   < > 7 8 6    < > = values in this interval not displayed.     Hematology Recent Labs  Lab 10/18/17 0424 10/20/17 0453 10/21/17 0510  WBC 4.6 5.2 5.2  RBC 3.30* 3.49* 3.68*  HGB 9.3* 9.6* 10.3*  HCT 28.6* 30.2* 31.7*  MCV 86.6 86.5 86.3  MCH 28.1 27.3 27.9  MCHC 32.4 31.6* 32.3  RDW 17.3* 17.6* 17.5*  PLT 136* 136* 130*    Cardiac EnzymesNo results for input(s): TROPONINI in the last 168 hours. No results for input(s): TROPIPOC in the last 168 hours.   BNPNo results for input(s): BNP, PROBNP in the last 168 hours.    DDimer No results for input(s): DDIMER in the last 168 hours.   Radiology    No results found.  Cardiac Studies   TTE 10/13/2017: Study Conclusions  - Left ventricle: The cavity size was normal. There was mild concentric hypertrophy. Systolic function was mildly to moderately reduced. The estimated ejection fraction was in the range of 40% to 45%. Hypokinesis of the anterior, anteroseptal, and apical myocardium. basal regions best preserved. The study is not technically sufficient to allow evaluation of LV diastolic function. - Mitral valve: There was moderate regurgitation. - Left atrium: The atrium was moderately dilated. - Right ventricle: The cavity size was moderately dilated. Wall thickness was normal. Systolic function was mildly reduced. - Right atrium: The atrium was moderately dilated. - Tricuspid valve: There was moderate regurgitation. - Pulmonary arteries: Systolic pressure was severely elevated. PA peak pressure: 65 mm Hg (S).   R/LHC 10/19/2017: Conclusion   1. Minor irregularities with no significant coronary artery disease. 2. Left ventricular angiography was not performed when recent acute renal failure. EF was mildly reduced by echo. 3. Right heart catheterization showed mildly elevated filling pressures, severe pulmonary hypertension and severely reduced cardiac output. RA pressure: 23 mmHg, RV pressure: 84 over 13 mmHg, PA pressure: 84/29 mmHg pulmonary capillary wedge pressure is 20 mmHg, LVEDP was 18 mmHg, cardiac output: 3.89 with a cardiac index of 1.89.  Recommendations: The patient has severe pulmonary hypertension that seems to be out of proportion to left heart failure. I resumed small dose torsemide 20 mg once daily. Resume Eliquis 5 mg twice daily tomorrow if no bleeding complications. The patient will need to be referred to advanced heart failure clinic in [facility] after hospital discharge to manage severe pulmonary  hypertension.    Patient Profile     72 y.o. female with history of nonobstructive CAD, chronic combined systolic and diastolic congestive heart failure, persistent atrial fibrilation, symptomatic bradycardia status post permanent pacemaker, frequent PVCs, chronic respiratory failure with hypoxia on home O2, COPD, hypertension, hyperlipidemia, obesity, type 2 diabetes mellitus, traumatic subarachnoid hemorrhage in the setting of Xarelto, left lower extremity DVT status post IVC filter, chronic right sided atypical chest pain, chronic left lower extremity lymphedema, prior stroke, sleep apnea, and obesity, who is being seen for  the evaluation ofacute on chronic combined heart failure in the setting of persistent atrial fibrillation.  Assessment & Plan    1. Acute on chronic combined CHF/NICM/severe pulmonary hypertension: -Improving, though she does have a narrow window between renal failure and SOB -R/LHC as above -Continue Coreg -Continue Revatio to 40 mg tid -Change torsemide 20 mg daily to Lasix 20 mg daily -Add spironolactone 12.5 mg daily -She will need to follow up with the advanced heart failure clinic in [place] as an outpatient   2. Persistent Afib: -Status post successful DCCV this admission -Continue amiodarone 200 mg daily and Coreg -Resume Eliquis (held for [facility] as above)  3. ARF: -Likely a component of ATN -Stable  4. Mitral valve disease: -Improved by follow up echo after DCCV -Outpatient follow up  5. Hypokalemia: -Replete to goal of 4.0 -Would recommend she receive at least 2 doses of KCl this morning with a recheck potassium level prior to discharge -Spironolactone added as above   For questions or updates, please contact [place] Please consult [email or web address] for contact info under Cardiology/STEMI.    Signed, Christell Faith, PA-C [place] Pager: [phone] 10/21/2017, 7:42 AM

## 2017-10-21 NOTE — Progress Notes (Addendum)
Patient's BP dropped to 99/50, MD was notified.  Dr. Manuella Ghazi said to give her normal saline at 75 mL per hour for her dizziness and low BP and to HOLD all blood pressure medicines.  Phillis Knack, RN  Dr. Candiss Norse said 75 per hour is okay, but reevaluate after 1 bag to assess because she has heart failure and other issues that limit fluids.

## 2017-10-21 NOTE — Care Management (Signed)
Discharge disposition is now for SNF at [facility].  Corene Cornea with [facility] notified of plan.  RNCM signing off.  It disposition changes and she is to return to home with home health and lasix protocol please consult RNCM

## 2017-10-22 ENCOUNTER — Telehealth: Payer: Self-pay | Admitting: *Deleted

## 2017-10-22 DIAGNOSIS — E785 Hyperlipidemia, unspecified: Secondary | ICD-10-CM | POA: Diagnosis not present

## 2017-10-22 DIAGNOSIS — E876 Hypokalemia: Secondary | ICD-10-CM | POA: Diagnosis not present

## 2017-10-22 DIAGNOSIS — J9621 Acute and chronic respiratory failure with hypoxia: Secondary | ICD-10-CM | POA: Diagnosis not present

## 2017-10-22 DIAGNOSIS — R748 Abnormal levels of other serum enzymes: Secondary | ICD-10-CM | POA: Diagnosis not present

## 2017-10-22 DIAGNOSIS — I509 Heart failure, unspecified: Secondary | ICD-10-CM | POA: Diagnosis not present

## 2017-10-22 DIAGNOSIS — Z87448 Personal history of other diseases of urinary system: Secondary | ICD-10-CM | POA: Diagnosis not present

## 2017-10-22 DIAGNOSIS — I5033 Acute on chronic diastolic (congestive) heart failure: Secondary | ICD-10-CM | POA: Diagnosis not present

## 2017-10-22 DIAGNOSIS — Z5189 Encounter for other specified aftercare: Secondary | ICD-10-CM | POA: Diagnosis not present

## 2017-10-22 DIAGNOSIS — M6281 Muscle weakness (generalized): Secondary | ICD-10-CM | POA: Diagnosis not present

## 2017-10-22 DIAGNOSIS — R110 Nausea: Secondary | ICD-10-CM | POA: Diagnosis not present

## 2017-10-22 DIAGNOSIS — J9620 Acute and chronic respiratory failure, unspecified whether with hypoxia or hypercapnia: Secondary | ICD-10-CM | POA: Diagnosis not present

## 2017-10-22 DIAGNOSIS — R531 Weakness: Secondary | ICD-10-CM | POA: Diagnosis not present

## 2017-10-22 DIAGNOSIS — J441 Chronic obstructive pulmonary disease with (acute) exacerbation: Secondary | ICD-10-CM | POA: Diagnosis not present

## 2017-10-22 DIAGNOSIS — I10 Essential (primary) hypertension: Secondary | ICD-10-CM | POA: Diagnosis not present

## 2017-10-22 DIAGNOSIS — J449 Chronic obstructive pulmonary disease, unspecified: Secondary | ICD-10-CM | POA: Diagnosis not present

## 2017-10-22 DIAGNOSIS — J9611 Chronic respiratory failure with hypoxia: Secondary | ICD-10-CM | POA: Diagnosis not present

## 2017-10-22 DIAGNOSIS — R600 Localized edema: Secondary | ICD-10-CM | POA: Diagnosis not present

## 2017-10-22 DIAGNOSIS — I4891 Unspecified atrial fibrillation: Secondary | ICD-10-CM | POA: Diagnosis not present

## 2017-10-22 DIAGNOSIS — Z7401 Bed confinement status: Secondary | ICD-10-CM | POA: Diagnosis not present

## 2017-10-22 DIAGNOSIS — N179 Acute kidney failure, unspecified: Secondary | ICD-10-CM | POA: Diagnosis not present

## 2017-10-22 DIAGNOSIS — I272 Pulmonary hypertension, unspecified: Secondary | ICD-10-CM | POA: Diagnosis not present

## 2017-10-22 LAB — BASIC METABOLIC PANEL
Anion gap: 9 (ref 5–15)
BUN: 18 mg/dL (ref 6–20)
CO2: 31 mmol/L (ref 22–32)
Calcium: 8.5 mg/dL — ABNORMAL LOW (ref 8.9–10.3)
Chloride: 100 mmol/L — ABNORMAL LOW (ref 101–111)
Creatinine, Ser: 0.98 mg/dL (ref 0.44–1.00)
GFR calc Af Amer: >60 mL/min (ref >60)
GFR calc non Af Amer: 55 mL/min — ABNORMAL LOW (ref >60)
Glucose, Bld: 118 mg/dL — ABNORMAL HIGH (ref 65–99)
Potassium: 3.6 mmol/L (ref 3.5–5.1)
Sodium: 140 mmol/L (ref 135–145)

## 2017-10-22 LAB — CBC
HCT: 30.1 % — ABNORMAL LOW (ref 35.0–47.0)
Hemoglobin: 9.8 g/dL — ABNORMAL LOW (ref 12.0–16.0)
MCH: 28 pg (ref 26.0–34.0)
MCHC: 32.4 g/dL (ref 32.0–36.0)
MCV: 86.2 fL (ref 80.0–100.0)
Platelets: 127 10*3/uL — ABNORMAL LOW (ref 150–440)
RBC: 3.5 MIL/uL — ABNORMAL LOW (ref 3.80–5.20)
RDW: 17.8 % — ABNORMAL HIGH (ref 11.5–14.5)
WBC: 5 10*3/uL (ref 3.6–11.0)

## 2017-10-22 LAB — GLUCOSE, CAPILLARY
Glucose-Capillary: 120 mg/dL — ABNORMAL HIGH (ref 65–99)
Glucose-Capillary: 170 mg/dL — ABNORMAL HIGH (ref 65–99)

## 2017-10-22 LAB — MAGNESIUM: Magnesium: 1.3 mg/dL — ABNORMAL LOW (ref 1.7–2.4)

## 2017-10-22 MED ORDER — AMIODARONE HCL 200 MG PO TABS: 200.0000 mg | ORAL_TABLET | Freq: Every day | ORAL | 0 refills | Status: DC

## 2017-10-22 MED ORDER — PANTOPRAZOLE SODIUM 40 MG PO TBEC: 40.0000 mg | DELAYED_RELEASE_TABLET | Freq: Every day | ORAL | 0 refills | Status: DC

## 2017-10-22 MED ORDER — CARVEDILOL 3.125 MG PO TABS: 3.1250 mg | ORAL_TABLET | Freq: Two times a day (BID) | ORAL | 0 refills | Status: DC

## 2017-10-22 NOTE — Progress Notes (Signed)
[place] [facility] [place] NAME: Debra Saunders    MR#:  [id number]  DATE OF BIRTH:  December 04, 1945  SUBJECTIVE:  CHIEF COMPLAINT:   Chief Complaint  Patient presents with  . Shortness of Breath  sitting in chair, waiting for insurance auth REVIEW OF SYSTEMS:  CONSTITUTIONAL: No fever, fatigue or weakness.  EYES: No blurred or double vision.  EARS, NOSE, AND THROAT: No tinnitus or ear pain.  RESPIRATORY: No cough, shortness of breath, wheezing or hemoptysis.  CARDIOVASCULAR: No chest pain, orthopnea, edema.  GASTROINTESTINAL: No nausea, vomiting, diarrhea or abdominal pain.  GENITOURINARY: No dysuria, hematuria.  ENDOCRINE: No polyuria, nocturia,  HEMATOLOGY: No anemia, easy bruising or bleeding SKIN: No rash or lesion. MUSCULOSKELETAL: No joint pain or arthritis.   NEUROLOGIC: No tingling, numbness, weakness.  PSYCHIATRY: No anxiety or depression.   ROS  DRUG ALLERGIES:   Allergies  Allergen Reactions  . Aspirin Hives and Other (See Comments)    Pt states that she is unable to take because she had bleeding in her brain.    . Other Other (See Comments)    Pt states that she is unable to take blood thinners because she had bleeding in her brain.  Blood Thinners-per doctor at [facility]  . Penicillins Hives, Shortness Of Breath, Swelling and Other (See Comments)    Has patient had a PCN reaction causing immediate rash, facial/tongue/throat swelling, SOB or lightheadedness with hypotension: Yes Has patient had a PCN reaction causing severe rash involving mucus membranes or skin necrosis: No Has patient had a PCN reaction that required hospitalization No Has patient had a PCN reaction occurring within the last 10 years: No If all of the above answers are "NO", then may proceed with Cephalosporin use.    VITALS:  Blood pressure (!) 149/78, pulse 60, temperature 98.7 F (37.1 C), temperature source Oral, resp. rate 20, height 5\' 1"  (1.549 m),  weight 105.1 kg (231 lb 11.2 oz), SpO2 98 %.  PHYSICAL EXAMINATION:  GENERAL:  72 y.o.-year-old patient lying in the bed with no acute distress.  EYES: Pupils equal, round, reactive to light and accommodation. No scleral icterus. Extraocular muscles intact.  HEENT: Head atraumatic, normocephalic. Oropharynx and nasopharynx clear.  NECK:  Supple, no jugular venous distention. No thyroid enlargement, no tenderness.  LUNGS: Normal breath sounds bilaterally, no wheezing, rales,rhonchi or crepitation. No use of accessory muscles of respiration.  CARDIOVASCULAR: S1, S2 normal. No murmurs, rubs, or gallops.  ABDOMEN: Soft, nontender, nondistended. Bowel sounds present. No organomegaly or mass.  EXTREMITIES: No pedal edema, cyanosis, or clubbing.  NEUROLOGIC: Cranial nerves II through XII are intact. Muscle strength 5/5 in all extremities. Sensation intact. Gait not checked.  PSYCHIATRIC: The patient is alert and oriented x 3.  SKIN: No obvious rash, lesion, or ulcer.   Physical Exam LABORATORY PANEL:   CBC Recent Labs  Lab 10/22/17 0416  WBC 5.0  HGB 9.8*  HCT 30.1*  PLT 127*   ------------------------------------------------------------------------------------------------------------------  Chemistries  Recent Labs  Lab 10/22/17 0416  NA 140  K 3.6  CL 100*  CO2 31  GLUCOSE 118*  BUN 18  CREATININE 0.98  CALCIUM 8.5*  MG 1.3*   ------------------------------------------------------------------------------------------------------------------  Cardiac Enzymes No results for input(s): TROPONINI in the last 168 hours. ------------------------------------------------------------------------------------------------------------------  RADIOLOGY:  No results found.  ASSESSMENT AND PLAN:  * Acute on chronic respiratory failure Secondary to acute on chronic diastolic CHF with flash pulmonary edema Continue lasix On 4 L  continuous at home which is her baseline  *Acute on  chronic combined systolic and diastolic CHF:  New Cardiomyopathy concerning for ischemic cardiomyopathy EF was 40 to 45% by echo.  Cardiac catheterization on 4/29 showed no significant coronary artery disease. Continue treatment with carvedilol and torsemide 20 mg once daily   *Persistent AF: Cardioversion this admission  normal sinus rhythm restored  Continue amiodarone 200 mg once daily. Eliquis to be resumed tomorrow morning per cardio  *Severe pulmonary hypertension: - Cont sildenafil.  Narrow window between renal dysfunction and shortness of breath. - Cardio Recommends referral to the advanced heart failure clinic in [place] - patient in agreement  * NSTEMI: ruled out. - Compounded by Cardiomyopathy EF 40-45% - no significant CAD on cath  *Acute renal failure with CKD stage 3 - Likely component of ATN Renal function is back to baseline. Did require 2 hemodialysis treatments Nephrology input appreciated  *Acute epistaxis  Resolved   *Chronic morbid obesity  Most likely secondary to excess calories  Lifestyle modification recommended   *Acute on COPD exacerbation Resolved  *Hyperlipidemia, unspecified Stable on Crestor  *Chronic diabetes mellitus type 2 Stable on current regiment  * Hypotension: held HTN meds and resolved.    needs STR.SNF at [place] - plan for D/C [facility] once insurance auth - likely today   All the records are reviewed and case discussed with Care Management/Social Workerr. Management plans discussed with the patient, nursing and they are in agreement.  CODE STATUS: full  TOTAL TIME TAKING CARE OF THIS PATIENT: 25 minutes.     POSSIBLE D/C IN 1 DAYS, DEPENDING ON CLINICAL CONDITION.   Max Sane M.D on 10/22/2017   Between 7am to 6pm - Pager - [phone]  After 6pm go to [email or web address] - password [email or web address] [facility]  Office  [phone]  CC: Primary care physician; [name],  MD  Note: This dictation was prepared with Dragon dictation along with smaller phrase technology. Any transcriptional errors that result from this process are unintentional.

## 2017-10-22 NOTE — Telephone Encounter (Signed)
Patient currently admitted. TCM 5/3.

## 2017-10-22 NOTE — Care Management Important Message (Signed)
Important Message  Patient Details  Name: Debra Saunders MRN: [id number] Date of Birth: [birth year]   Medicare Important Message Given:  Yes    Beverly Sessions, RN 10/22/2017, 10:47 AM

## 2017-10-22 NOTE — Discharge Instructions (Signed)
Pulmonary Hypertension Pulmonary hypertension is high blood pressure within the arteries in your lungs (pulmonary arteries). It is different than having high blood pressure elsewhere in your body, such as blood pressure that is measured with a blood pressure cuff. Pulmonary hypertension makes it harder for blood to flow through the lungs. As a result, the heart must work harder to pump blood through the lungs, and it may be harder for you to breathe. Over time, this can weaken the heart muscle. Pulmonary hypertension is a serious condition and it can be fatal. What are the causes? Many different medical conditions can cause pulmonary hypertension. Pulmonary hypertension can be categorized by cause into five groups: Group 1 Pulmonary hypertension that is caused by abnormal growth of small blood vessels in the lungs (pulmonary arterial hypertension). The abnormal blood vessel growth may have no known cause, or it may be:  Passed along from a parent (hereditary).  Caused by another disease, such as a connective tissue disease (including lupus or scleroderma) or HIV.  Caused by certain drugs or toxins.  Group 2 Pulmonary hypertension that is caused by weakness of the main chamber of the heart (left ventricle) or heart valve disease. Group 3 Pulmonary hypertension that is caused by lung disease or low oxygen levels. Causes in this group include:  Emphysema or chronic obstructive pulmonary disease (COPD).  Untreated sleep apnea.  Pulmonary fibrosis.  Group 4 Pulmonary hypertension that is caused by blood clots in the lungs (pulmonary emboli). Group 5 Other causes of pulmonary hypertension, such as sickle cell anemia, or a mix of multiple causes. What are the signs or symptoms? Symptoms of this condition include:  Shortness of breath. You may notice shortness of breath with: ? Activity, such as walking. ? No activity.  Tiredness and fatigue.  Dizziness or fainting.  Rapid heartbeat or  feeling your heart flutter or skip a beat (palpitations).  Neck vein enlargement.  Bluish color to your lips and fingertips.  How is this diagnosed? This condition may be diagnosed by:  Chest X-ray.  Arterial blood gases. This test checks the acidity of your blood as well as your blood oxygen and carbon dioxide levels.  CT scan. This test can provide detailed images of your lungs.  Pulmonary function test. This test measures how much air your lungs can hold. It also tests how well air moves in and out of your lungs.  Electrocardiogram (ECG). This test traces the electrical activity of your heart.  Echocardiogram. This test is used to look at your heart in motion and check how it is functioning.  Heart catheterization. This test can measure the pressure in your pulmonary artery and the right side of your heart.  Lung biopsy. This procedure involves checking a sample of lung tissue to find underlying causes.  How is this treated? There is no cure for pulmonary hypertension, but treatment can help to relieve symptoms and slow the progress of the condition. Treatment can involve:  Medicines, such as: ? Blood pressure medicines. ? Medicines to relax (dilate) the pulmonary blood vessels. ? Water pills to get rid of extra fluid (diuretic medicines). ? Blood-thinning medicines.  Surgery. For severe pulmonary hypertension that does not respond to medical treatment, heart-lung or lung transplant may be needed.  Follow these instructions at home:  Take medicines only as directed by your health care provider. These include over-the-counter medicines and prescription medicines. Take all medicines exactly as instructed. Do not change or stop medicines without first checking with your health   care provider.  Do not smoke. If you need help quitting, ask your health care provider.  Eat a healthy diet.  Limit your salt (sodium) intake to less than 2,300 mg per day.  Stay as active as  possible. Exercise as directed by your health care provider. Talk with your health care provider about what type of exercise is safe for you.  Avoid high altitudes.  Avoid hot tubs and saunas.  Avoid becoming pregnant, if this applies. Talk with your health care provider about safe methods of birth control.  Keep all follow-up visits as directed by your health care provider. This is important. Get help right away if:  You have severe shortness of breath.  You develop chest pain or pressure in your chest.  You cough up blood.  You develop swelling of your feet or legs.  You have a significant increase in weight within 1-2 days. This information is not intended to replace advice given to you by your health care provider. Make sure you discuss any questions you have with your health care provider. Document Released: 04/06/2007 Document Revised: 12/28/2015 Document Reviewed: 11/29/2012 Elsevier Interactive Patient Education  2018 [employer]vier Inc[employer]  

## 2017-10-22 NOTE — Progress Notes (Signed)
Physical Therapy Treatment Patient Details Name: Debra Saunders MRN: [id number] DOB: 07-20-1945 Today's Date: 10/22/2017    History of Present Illness presented to ER secondary to SOB, weigth gain; admitted with CHF exacerbation, volume overload.  Hospital course complicated by worsening renal failure requiring temporary dialysis (temp fem cath removed 4/18); worsening cardio-renal syndrome with severe respiratory distress requiring BiPAP and transfer to CCU; NSTEMI (troponin 2.6).  Status post cardioversion 4/22 due to afib with RVR; pending cardiac cath 4/29.    PT Comments    Pt in bed, inc urine (external cath with leaking).  To edge of bed with rail and increased time but did not need assist today.  Min guard for safety.  She was able to stand with good safety and hand placements.  Transferred to commode to void and for cleaning.  She was then able to ambulate 30' in room with slow gait and frequent self initiated rest breaks.  Flexed posture which pt tries to correct without cues.  No chest pain reported today.  While overall distance and tolerance increased today, she remains with global weakness and fatigues easily with minimal activity.  SNF remains appropriate upon discharge.   Follow Up Recommendations  SNF     Equipment Recommendations  Rolling walker with 5" wheels;3in1 (PT)    Recommendations for Other Services       Precautions / Restrictions Precautions Precautions: Fall Restrictions Weight Bearing Restrictions: No    Mobility  Bed Mobility Overal bed mobility: Needs Assistance Bed Mobility: Supine to Sit     Supine to sit: Min guard        Transfers Overall transfer level: Modified independent Equipment used: Rolling walker (2 wheeled) Transfers: Sit to/from Stand Sit to Stand: Modified independent (Device/Increase time)            Ambulation/Gait Ambulation/Gait assistance: Min guard;Min assist Ambulation Distance (Feet): 30 Feet Assistive  device: Rolling walker (2 wheeled) Gait Pattern/deviations: Step-through pattern;Decreased step length - right;Decreased step length - left;Trunk flexed   Gait velocity interpretation: <1.8 ft/sec, indicate of risk for recurrent falls General Gait Details: slow gait but no buckling or LOB, fatigues quickly   Stairs             Wheelchair Mobility    Modified Rankin (Stroke Patients Only)       Balance Overall balance assessment: Needs assistance Sitting-balance support: Feet supported;No upper extremity supported Sitting balance-Leahy Scale: Good     Standing balance support: Bilateral upper extremity supported Standing balance-Leahy Scale: Fair Standing balance comment: heavy reliance of walker for gait                            Cognition Arousal/Alertness: Awake/alert Behavior During Therapy: WFL for tasks assessed/performed Overall Cognitive Status: Within Functional Limits for tasks assessed                                        Exercises Other Exercises Other Exercises: to commode.      General Comments        Pertinent Vitals/Pain Pain Assessment: No/denies pain    Home Living                      Prior Function            PT Goals (current goals can now be found  in the care plan section) Progress towards PT goals: Progressing toward goals    Frequency    Min 2X/week      PT Plan Current plan remains appropriate    Co-evaluation              AM-PAC PT "6 Clicks" Daily Activity  Outcome Measure  Difficulty turning over in bed (including adjusting bedclothes, sheets and blankets)?: None Difficulty moving from lying on back to sitting on the side of the bed? : None Difficulty sitting down on and standing up from a chair with arms (e.g., wheelchair, bedside commode, etc,.)?: None Help needed moving to and from a bed to chair (including a wheelchair)?: A Little Help needed walking in hospital  room?: A Little Help needed climbing 3-5 steps with a railing? : A Lot 6 Click Score: 20    End of Session Equipment Utilized During Treatment: Gait belt;Oxygen Activity Tolerance: Patient tolerated treatment well Patient left: in chair;with chair alarm set;with call bell/phone within reach         Time: 0845-0904 PT Time Calculation (min) (ACUTE ONLY): 19 min  Charges:  $Gait Training: 8-22 mins                    G Codes:      Chesley Noon, PTA 10/22/17, 9:12 AM

## 2017-10-22 NOTE — Telephone Encounter (Signed)
-----   Message from Blain Pais sent at 10/22/2017 10:39 AM EDT ----- Regarding: tcm/ph 5/13 2pm Christell Faith, PA

## 2017-10-22 NOTE — Clinical Social Work Placement (Signed)
   CLINICAL SOCIAL WORK PLACEMENT  NOTE  Date:  10/22/2017  Patient Details  Name: Debra Saunders MRN: [id number] Date of Birth: 1945/10/04  Clinical Social Work is seeking post-discharge placement for this patient at the [place] level of care (*CSW will initial, date and re-position this form in  chart as items are completed):  Yes   Patient/family provided with [facility] Work Department's list of facilities offering this level of care within the geographic area requested by the patient (or if unable, by the patient's family).  Yes   Patient/family informed of their freedom to choose among providers that offer the needed level of care, that participate in Medicare, Medicaid or managed care program needed by the patient, have an available bed and are willing to accept the patient.  Yes   Patient/family informed of [facility]'s ownership interest in [facility] and [facility], as well as of the fact that they are under no obligation to receive care at these facilities.  PASRR submitted to EDS on       PASRR number received on       Existing PASRR number confirmed on 10/18/17     FL2 transmitted to all facilities in geographic area requested by pt/family on 10/18/17     FL2 transmitted to all facilities within larger geographic area on       Patient informed that his/her managed care company has contracts with or will negotiate with certain facilities, including the following:        Yes   Patient/family informed of bed offers received.  Patient chooses bed at [facility]     Physician recommends and patient chooses bed at      Patient to be transferred to [facility] on 10/22/17.  Patient to be transferred to facility by [facility]     Patient family notified on 10/22/17 of transfer.  Name of family member notified:  [name] patient's daughter.     PHYSICIAN Please sign FL2     Additional  Comment:    _______________________________________________ Ross Ludwig, LCSWA 10/22/2017, 3:21 PM

## 2017-10-22 NOTE — Discharge Summary (Addendum)
[facility] [place] NAME: Debra Saunders    MR#:  [id number]  DATE OF BIRTH:  Jan 28, 1946  DATE OF ADMISSION:  09/30/2017   ADMITTING PHYSICIAN: Gorden Harms, MD  DATE OF DISCHARGE: 10/22/2017  PRIMARY CARE PHYSICIAN: [name], MD   ADMISSION DIAGNOSIS:  Chronic systolic heart failure ([place]) [I50.22] Acute on chronic combined systolic and diastolic congestive heart failure (HCC) [I50.43] Acute on chronic respiratory failure with hypoxia (HCC) [J96.21] DISCHARGE DIAGNOSIS:  Active Problems:   Atrial fibrillation (HCC)   CHF (congestive heart failure) ([place])   Acute renal failure (HCC)   ESRD (end stage renal disease) on dialysis ([place])   Shortness of breath  SECONDARY DIAGNOSIS:   Past Medical History:  Diagnosis Date  . Arthritis   . Atrial fibrillation, persistent ([place])    a. s/p TEE-guided DCCV on 08/20/2017  . Chest pain    a. 01/2012 Cath: nonobs dzs;  c. 04/2014 MV: No ischemia.  . Chronic back pain   . Chronic combined systolic (congestive) and diastolic (congestive) heart failure ([place])    a. Dx 2005 @ [facility];  b. 02/2014 Echo: Ef 55-60%, no rwma, mild MR, PASP 78mmHg. c. 07/2017: EF 30-35%, diffuse HK, trivial AI, severe MR, moderate TR  . Chronic respiratory failure (HCC)    a. on home O2  . COPD (chronic obstructive pulmonary disease) ([place])    a. Home O2 - 3lpm  . Gastritis   . History of intracranial hemorrhage    a. 6/14 described by neurosurgery as "small frontal posttraumatic SAH " - pt was on xarelto @ the time.  . Hyperlipidemia   . Hypertension   . Lipoma of back   . Lymphedema    a. Chronic LLE edema.  . Obesity   . Sepsis with metabolic encephalopathy ([place])   . Sleep apnea   . Streptococcal bacteremia   . Stroke ([place])   . Symptomatic bradycardia    a. s/p BSX PPM.  . Thyroid nodule   . Type II diabetes mellitus ([place])   . Urinary incontinence    HOSPITAL COURSE:  * Acute on chronic  respiratory failure Secondary to acute on chronic diastolic CHF with flash pulmonary edema Resume torsemide On 4 L continuous at home which is her baseline  *Acute on chronic combined systolic and diastolic CHF:  EF was 40 to 45% by echo.  Cardiac catheterization on 4/29 showed no significant coronary artery disease. Continue treatment with carvedilol and torsemide 20 mg once daily   *Persistent AF: Cardioversion this admission  normal sinus rhythm restored  Continue amiodarone 200 mg once daily. Eliquis for anticoagulation  *Severe pulmonary hypertension: - Cont sildenafil.  - Narrow window between renal dysfunction and shortness of breath. -  Recommends referral to the advanced heart failure clinic in [place] - patient in agreement - patient is not a candidate for cardiac rehab due to advanced CHF, & pulmo HTN and needs STR  * NSTEMI: ruled out. - Cardiomyopathy EF 40-45% - no significant CAD on cath  *Acute renal failure with CKD stage 3 - Likely component of ATN Renal function is back to baseline. Did require 2 hemodialysis treatments Nephrology input appreciated  *Acute epistaxis  Resolved   *Chronic morbid obesity  Most likely secondary to excess calories  Lifestyle modification recommended   *Acute on COPD exacerbation Resolved  *Hyperlipidemia, unspecified Stable on Crestor  *Chronic diabetes mellitus type 2 Stable on current regimen  * Hypotension:  Resolved. DISCHARGE CONDITIONS:  stable CONSULTS OBTAINED:  Treatment Team:  Minna Merritts, MD Anthonette Legato, MD Schnier, Dolores Lory, MD DRUG ALLERGIES:   Allergies  Allergen Reactions  . Aspirin Hives and Other (See Comments)    Pt states that she is unable to take because she had bleeding in her brain.    . Other Other (See Comments)    Pt states that she is unable to take blood thinners because she had bleeding in her brain.  Blood Thinners-per doctor at [facility]  .  Penicillins Hives, Shortness Of Breath, Swelling and Other (See Comments)    Has patient had a PCN reaction causing immediate rash, facial/tongue/throat swelling, SOB or lightheadedness with hypotension: Yes Has patient had a PCN reaction causing severe rash involving mucus membranes or skin necrosis: No Has patient had a PCN reaction that required hospitalization No Has patient had a PCN reaction occurring within the last 10 years: No If all of the above answers are "NO", then may proceed with Cephalosporin use.   DISCHARGE MEDICATIONS:   Allergies as of 10/22/2017      Reactions   Aspirin Hives, Other (See Comments)   Pt states that she is unable to take because she had bleeding in her brain.     Other Other (See Comments)   Pt states that she is unable to take blood thinners because she had bleeding in her brain.  Blood Thinners-per doctor at [facility], Shortness Of Breath, Swelling, Other (See Comments)   Has patient had a PCN reaction causing immediate rash, facial/tongue/throat swelling, SOB or lightheadedness with hypotension: Yes Has patient had a PCN reaction causing severe rash involving mucus membranes or skin necrosis: No Has patient had a PCN reaction that required hospitalization No Has patient had a PCN reaction occurring within the last 10 years: No If all of the above answers are "NO", then may proceed with Cephalosporin use.      Medication List    STOP taking these medications   metoprolol 200 MG 24 hr tablet Commonly known as:  TOPROL-XL   oxyCODONE-acetaminophen 5-325 MG tablet Commonly known as:  PERCOCET/ROXICET   promethazine 25 MG tablet Commonly known as:  PHENERGAN     TAKE these medications   acetaminophen 325 MG tablet Commonly known as:  TYLENOL Take 325-650 mg by mouth every 6 (six) hours as needed for mild pain. Reported on 10/10/2015   albuterol (2.5 MG/3ML) 0.083% nebulizer solution Commonly known as:  PROVENTIL Take 3  mLs (2.5 mg total) by nebulization every 6 (six) hours.   amiodarone 200 MG tablet Commonly known as:  PACERONE Take 1 tablet (200 mg total) by mouth daily. Start taking on:  10/23/2017   apixaban 5 MG Tabs tablet Commonly known as:  ELIQUIS Take 1 tablet (5 mg total) by mouth 2 (two) times daily.   BIOFREEZE EX Apply 1 application topically as needed (for pain).   carvedilol 3.125 MG tablet Commonly known as:  COREG Take 1 tablet (3.125 mg total) by mouth 2 (two) times daily with a meal.   Dulaglutide 0.75 MG/0.5ML Sopn Commonly known as:  [name] Inject 2.53 mg into the skin once a week.   gabapentin 100 MG capsule Commonly known as:  NEURONTIN TAKE 2 CAPSULES BY MOUTH THREE TIMES DAILY   glucose blood test strip Commonly known as:  TRUE METRIX BLOOD GLUCOSE TEST Check blood glucose twice daily Dx:E11.8, Z79.4   losartan 25 MG tablet Commonly known  as:  COZAAR Take 1 tablet (25 mg total) by mouth daily.   metFORMIN 500 MG tablet Commonly known as:  GLUCOPHAGE Take 1,000 mg by mouth 2 (two) times daily with a meal.   ondansetron 8 MG tablet Commonly known as:  ZOFRAN Take 1 tablet (8 mg total) by mouth every 8 (eight) hours as needed for nausea or vomiting.   pantoprazole 40 MG tablet Commonly known as:  PROTONIX Take 1 tablet (40 mg total) by mouth daily. Start taking on:  10/23/2017   potassium chloride SA 20 MEQ tablet Commonly known as:  K-DUR,KLOR-CON Take 1 tablet (20 mEq total) by mouth daily.   protein supplement shake Liqd Commonly known as:  PREMIER PROTEIN Take 325 mLs (11 oz total) by mouth 2 (two) times daily between meals.   rosuvastatin 10 MG tablet Commonly known as:  CRESTOR Take 1 tablet (10 mg total) by mouth daily.   sildenafil 20 MG tablet Commonly known as:  REVATIO Take 20 mg by mouth 3 (three) times daily.   spironolactone 25 MG tablet Commonly known as:  ALDACTONE Take 0.5 tablets (12.5 mg total) by mouth daily.   torsemide  20 MG tablet Commonly known as:  DEMADEX Take 1 tablet (20 mg total) by mouth daily. What changed:    how much to take  how to take this  when to take this  additional instructions        DISCHARGE INSTRUCTIONS:   DIET:  Cardiac diet DISCHARGE CONDITION:  Stable ACTIVITY:  Activity as tolerated OXYGEN:  Home Oxygen: Yes.    Oxygen Delivery: 4 liters/min via Patient connected to nasal cannula oxygen DISCHARGE LOCATION:  nursing home   If you experience worsening of your admission symptoms, develop shortness of breath, life threatening emergency, suicidal or homicidal thoughts you must seek medical attention immediately by calling 911 or calling your MD immediately  if symptoms less severe.  You Must read complete instructions/literature along with all the possible adverse reactions/side effects for all the Medicines you take and that have been prescribed to you. Take any new Medicines after you have completely understood and accpet all the possible adverse reactions/side effects.   Please note  You were cared for by a hospitalist during your hospital stay. If you have any questions about your discharge medications or the care you received while you were in the hospital after you are discharged, you can call the unit and asked to speak with the hospitalist on call if the hospitalist that took care of you is not available. Once you are discharged, your primary care physician will handle any further medical issues. Please note that NO REFILLS for any discharge medications will be authorized once you are discharged, as it is imperative that you return to your primary care physician (or establish a relationship with a primary care physician if you do not have one) for your aftercare needs so that they can reassess your need for medications and monitor your lab values.    On the day of Discharge:  VITAL SIGNS:  Blood pressure (!) 149/78, pulse 60, temperature 98.7 F (37.1 C),  temperature source Oral, resp. rate 20, height 5\' 1"  (1.549 m), weight 105.1 kg (231 lb 11.2 oz), SpO2 98 %. PHYSICAL EXAMINATION:  GENERAL:  71 y.o.-year-old patient lying in the bed with no acute distress.  EYES: Pupils equal, round, reactive to light and accommodation. No scleral icterus. Extraocular muscles intact.  HEENT: Head atraumatic, normocephalic. Oropharynx and nasopharynx clear.  NECK:  Supple, no jugular venous distention. No thyroid enlargement, no tenderness.  LUNGS: Normal breath sounds bilaterally, no wheezing, rales,rhonchi or crepitation. No use of accessory muscles of respiration.  CARDIOVASCULAR: S1, S2 normal. No murmurs, rubs, or gallops.  ABDOMEN: Soft, non-tender, non-distended. Bowel sounds present. No organomegaly or mass.  EXTREMITIES: No pedal edema, cyanosis, or clubbing.  NEUROLOGIC: Cranial nerves II through XII are intact. Muscle strength 5/5 in all extremities. Sensation intact. Gait not checked.  PSYCHIATRIC: The patient is alert and oriented x 3.  SKIN: No obvious rash, lesion, or ulcer.  DATA REVIEW:   CBC Recent Labs  Lab 10/22/17 0416  WBC 5.0  HGB 9.8*  HCT 30.1*  PLT 127*    Chemistries  Recent Labs  Lab 10/22/17 0416  NA 140  K 3.6  CL 100*  CO2 31  GLUCOSE 118*  BUN 18  CREATININE 0.98  CALCIUM 8.5*  MG 1.3*      Contact information for follow-up providers    [name], MD. Schedule an appointment as soon as possible for a visit in 5 day(s).   Specialty:  Family Medicine Contact information: [address] [place] [address] [phone]        Wellington Hampshire, MD. Schedule an appointment as soon as possible for a visit in 1 week(s).   Specialty:  Cardiology Contact information: [place] [address] [phone]            Contact information for after-discharge care    Destination    [facility] .   Service:  Skilled  Nursing Contact information: [address] [place] [phone]                   Management plans discussed with the patient, family and they are in agreement.  CODE STATUS: Full Code   TOTAL TIME TAKING CARE OF THIS PATIENT: 35 minutes.    Max Sane M.D on 10/22/2017 at 10:10 AM  Between 7am to 6pm - Pager - [phone]  After 6pm go to [email or web address] - Technical brewer [facility]  Office  [phone]  CC: Primary care physician; [name], MD   Note: This dictation was prepared with Dragon dictation along with smaller phrase technology. Any transcriptional errors that result from this process are unintentional.

## 2017-10-22 NOTE — Clinical Social Work Note (Signed)
Patient to be d/c'ed today to [facility], room 502.  Patient and family agreeable to plans will transport via ems RN to call report to 500 hall nurse [phone].  CSW updated patient's daughter [name] [phone].   Evette Cristal, MSW, [place]

## 2017-10-22 NOTE — Progress Notes (Signed)
Pt discharged to [facility]  via EMS, report called to Belinda, pt VSS, IV removed, follow up appointments made, no complaints at this time.

## 2017-10-22 NOTE — Progress Notes (Signed)
[facility]  ROUNDING NOTE   Subjective:   Patient underwent cardiac cath this admission.  She was noted to have severe pulmonary hypertension Sitting up in the chair.  Feels full today.  Able to eat without nausea or vomiting.  Serum creatinine today is 0.98   Objective:  Vital signs in last 24 hours:  Temp:  [98.2 F (36.8 C)-98.7 F (37.1 C)] 98.7 F (37.1 C) (05/02 0745) Pulse Rate:  [60-64] 60 (05/02 0745) Resp:  [17-20] 20 (05/02 0745) BP: (106-149)/(53-84) 149/78 (05/02 0745) SpO2:  [94 %-100 %] 98 % (05/02 1358) Weight:  [231 lb 11.2 oz (105.1 kg)] 231 lb 11.2 oz (105.1 kg) (05/02 0442)  Weight change: -1.6 oz (-0.045 kg) Filed Weights   10/20/17 0352 10/21/17 0421 10/22/17 0442  Weight: 242 lb 8 oz (110 kg) 231 lb 12.8 oz (105.1 kg) 231 lb 11.2 oz (105.1 kg)    Intake/Output: I/O last 3 completed shifts: In: -  Out: 1800 [Urine:1800]   Intake/Output this shift:  Total I/O In: -  Out: 650 [Urine:650]  Physical Exam: General:  No acute distress  Head: Normocephalic, atraumatic.   Eyes: Anicteric  Neck: Supple, trachea midline  Lungs:  Clear b/l  Heart: S1S2 no rubs, S3 gallop  Abdomen:  Soft, nontender, bowel sounds present  Extremities: 1-2+peripheral edema.   Neurologic: Awake, alert, following commands  Skin: warm       Basic Metabolic Panel: Recent Labs  Lab 10/17/17 0420 10/19/17 0448 10/20/17 0453 10/21/17 0510 10/21/17 1216 10/22/17 0416  NA 139 141 140 140  --  140  K 4.3 3.9 3.3* 2.7* 4.0 3.6  CL 105 105 101 99*  --  100*  CO2 29 29 31  35*  --  31  GLUCOSE 112* 134* 135* 109*  --  118*  BUN 75* 31* 26* 20  --  18  CREATININE 1.48* 1.02* 0.97 1.01*  --  0.98  CALCIUM 9.1 8.8* 8.7* 8.5*  --  8.5*  MG 1.9  --   --   --   --  1.3*  PHOS  --  3.1  --   --   --   --     Liver Function Tests: Recent Labs  Lab 10/19/17 0448  ALBUMIN 2.8*   No results for input(s): LIPASE, AMYLASE in the last 168 hours. No results for  input(s): AMMONIA in the last 168 hours.  CBC: Recent Labs  Lab 10/17/17 0420 10/18/17 0424 10/20/17 0453 10/21/17 0510 10/22/17 0416  WBC 6.2 4.6 5.2 5.2 5.0  HGB 9.4* 9.3* 9.6* 10.3* 9.8*  HCT 29.2* 28.6* 30.2* 31.7* 30.1*  MCV 86.0 86.6 86.5 86.3 86.2  PLT 164 136* 136* 130* 127*    Cardiac Enzymes: No results for input(s): CKTOTAL, CKMB, CKMBINDEX, TROPONINI in the last 168 hours.  BNP: Invalid input(s): POCBNP  CBG: Recent Labs  Lab 10/21/17 1119 10/21/17 1617 10/21/17 2038 10/22/17 0744 10/22/17 1158  GLUCAP 170* 158* 148* 120* 170*    Microbiology: Results for orders placed or performed during the hospital encounter of 09/30/17  MRSA PCR Screening     Status: Abnormal   Collection Time: 10/01/17  9:37 AM  Result Value Ref Range Status   MRSA by PCR POSITIVE (A) NEGATIVE Final    Comment:        [name] (FDA approved for NASAL specimens only), is one component of a comprehensive MRSA colonization surveillance program. It is not intended to diagnose MRSA infection  nor to guide or monitor treatment for MRSA infections. RESULT CALLED TO, READ BACK BY AND VERIFIED WITH:  Gildardo Pounds AT 1207 10/01/17 SDR Performed at [facility], [address] [place], [place] [address]     Coagulation Studies: No results for input(s): LABPROT, INR in the last 72 hours.  Urinalysis: No results for input(s): COLORURINE, LABSPEC, PHURINE, GLUCOSEU, HGBUR, BILIRUBINUR, KETONESUR, PROTEINUR, UROBILINOGEN, NITRITE, LEUKOCYTESUR in the last 72 hours.  Invalid input(s): APPERANCEUR    Imaging: No results found.   Medications:   . sodium chloride     . amiodarone  200 mg Oral Daily  . apixaban  5 mg Oral BID  . budesonide (PULMICORT) nebulizer solution  0.5 mg Nebulization BID  . carvedilol  3.125 mg Oral BID WC  . chlorhexidine  15 mL Mouth Rinse BID  . furosemide  40 mg Oral Daily  . gabapentin  300 mg Oral QHS  . insulin  aspart  0-15 Units Subcutaneous TID WC  . insulin aspart  0-5 Units Subcutaneous QHS  . ipratropium-albuterol  3 mL Nebulization TID  . mouth rinse  15 mL Mouth Rinse q12n4p  . multivitamin with minerals  1 tablet Oral Daily  . mupirocin ointment   Nasal BID  . pantoprazole  40 mg Oral Daily  . rosuvastatin  10 mg Oral Daily  . sildenafil  40 mg Oral TID  . sodium chloride flush  3 mL Intravenous Q12H  . sodium chloride flush  3 mL Intravenous Q12H  . spironolactone  12.5 mg Oral Daily   sodium chloride, acetaminophen, ALPRAZolam, alum & mag hydroxide-simeth, benzonatate, ipratropium-albuterol, menthol-cetylpyridinium, ondansetron (ZOFRAN) IV, ondansetron, oxyCODONE-acetaminophen, promethazine, sodium chloride flush, sodium chloride flush  Assessment/ Plan:  71 y.o.black female with a PMHx of chronic diastolic heart failure, atrial fibrillation, chronic hypercarbic respiratory failure, COPD, morbid obesity, hyperlipidemia, diabetes mellitus type 2, history of intracranial hemorrhage who was admitted to [facility] on 09/30/2017   1.  Acute renal failure: Acute renal failure secondary to acute cardiorenal syndrome and overdiuresis  Required two treatment of hemodialysis on 4/16 and 4/17. No acute indication for dialysis. Dialysis catheter has been removed.  S Creatinine is close to baseline Outpatient follow up  2. Hypertension with atrial fibrillation: status post cardioversion on 4/22.  Holding home medications.  Acute exacerbation of systolic and diastolic congestive heart failure.  - amiodarone. - Carvedilol - sildenafil for pulmonary hypertension. - may be causing dizziness. Dose to be adjusted by cardiology - Dopplers negative for DVT. History DVT in the past.   - Cardiac catheterization done this admission  3.  Pulm HTN - Patient states she is compliant with CPAP for OSA - outpatient follow up  4. LE edema -Agree with switching back to torsemide 20 mg daily - monitor weight at  home  5.Hypoaklemia - replace PRN     LOS: [place] 5/2/20192:36 PM

## 2017-10-22 NOTE — Telephone Encounter (Signed)
Staff message received from [name], NP to please schedule the patient for a new patient appointment the the pulmonary HTN clinic in [place]. Staff message forwarded to [name], RN (CHF/ Pulmonary HTN Clinic) to arrange.

## 2017-10-26 ENCOUNTER — Telehealth: Payer: Self-pay | Admitting: Cardiovascular Disease

## 2017-10-26 NOTE — Telephone Encounter (Signed)
S/w Belinda from [facility].  States the patient had a 4.4lb weight gain this morning, 228.4lb. Denies chest pain or shortness of breath. Reviewed that patient is taking spironolactone 25 mg daily and torsemide 20 mg daily. BP 133/77. Mild swelling in ankles. Right greater than left per patient. Advised to elevate and use compression stockings. Advised to have patient take an extra 20 mg of torsemide today and then I will route to Dr Fletcher Anon for further recommendations.

## 2017-10-26 NOTE — Telephone Encounter (Signed)
Continue to monitor weight and let us know if the weight does not go down with additional dose of torsemide.  I do not want to over diurese her given recent renal failure.

## 2017-10-26 NOTE — Telephone Encounter (Signed)
Debra Saunders from [facility] 4 pound weight gain over night   Pt c/o swelling: STAT is pt has developed SOB within 24 hours  1) How much weight have you gained and in what time span? 4 pounds over night  2) If swelling, where is the swelling located? Lower extremity   3) Are you currently taking a fluid pill?   4) Are you currently SOB? No   5) Do you have a log of your daily weights (if so, list)? Does not have one   6) Have you gained 3 pounds in a day or 5 pounds in a week? In an day   7) Have you traveled recently? No      Please ask for Nurse Riley Lam

## 2017-10-27 NOTE — Telephone Encounter (Signed)
Patient is not a TCM as she is in the [facility].  Awaiting response from Debra Bayley, NP regarding timing of appointment needed for heart failure clinic as new patient appointments are not available until July.

## 2017-10-27 NOTE — Telephone Encounter (Signed)
S/w Marliss Coots, who is caring for patient today again. She verbalized understanding to monitor weight. They have not weighed patient yet this morning but will call us if weight does not go down.

## 2017-10-28 ENCOUNTER — Other Ambulatory Visit
Admission: RE | Admit: 2017-10-28 | Discharge: 2017-10-28 | Disposition: A | Discharge status: Home / Self Care | Payer: Medicare HMO | Source: Ambulatory Visit | Attending: Family Medicine | Admitting: Family Medicine

## 2017-10-28 ENCOUNTER — Inpatient Hospital Stay: Payer: Medicare HMO | Admitting: Family Medicine

## 2017-10-28 DIAGNOSIS — I4891 Unspecified atrial fibrillation: Secondary | ICD-10-CM | POA: Diagnosis not present

## 2017-10-28 DIAGNOSIS — Z0289 Encounter for other administrative examinations: Secondary | ICD-10-CM

## 2017-10-28 DIAGNOSIS — I272 Pulmonary hypertension, unspecified: Secondary | ICD-10-CM | POA: Diagnosis not present

## 2017-10-28 DIAGNOSIS — E785 Hyperlipidemia, unspecified: Secondary | ICD-10-CM | POA: Diagnosis not present

## 2017-10-28 DIAGNOSIS — Z87448 Personal history of other diseases of urinary system: Secondary | ICD-10-CM | POA: Diagnosis not present

## 2017-10-28 DIAGNOSIS — I10 Essential (primary) hypertension: Secondary | ICD-10-CM | POA: Diagnosis not present

## 2017-10-28 DIAGNOSIS — R110 Nausea: Secondary | ICD-10-CM | POA: Diagnosis not present

## 2017-10-28 DIAGNOSIS — J449 Chronic obstructive pulmonary disease, unspecified: Secondary | ICD-10-CM | POA: Diagnosis not present

## 2017-10-28 DIAGNOSIS — I509 Heart failure, unspecified: Secondary | ICD-10-CM | POA: Insufficient documentation

## 2017-10-28 DIAGNOSIS — J9620 Acute and chronic respiratory failure, unspecified whether with hypoxia or hypercapnia: Secondary | ICD-10-CM | POA: Diagnosis not present

## 2017-10-28 LAB — BRAIN NATRIURETIC PEPTIDE: B Natriuretic Peptide: 290 pg/mL — ABNORMAL HIGH (ref 0.0–100.0)

## 2017-10-29 NOTE — Telephone Encounter (Signed)
Schub, Marsh Dolly, RN  Emily Filbert, RN        Ok thanks I'll get her worked in   Previous Messages    ----- Message -----  From: Emily Filbert, RN  Sent: 10/28/2017  4:44 PM  To: Scarlette Calico, RN   See Gerald Stabs' note below.  ----- Message -----  From: Theora Gianotti, NP  Sent: 10/28/2017  1:35 PM  To: Emily Filbert, RN   This is chronic though she did just have prolonged hospitalization w/ right sided CHF and cardiorenal syndrome. So sooner rather than later would be ideal. Any opportunities in June?

## 2017-10-30 ENCOUNTER — Telehealth: Payer: Self-pay | Admitting: Physician Assistant

## 2017-10-30 NOTE — Telephone Encounter (Signed)
Attempted to call patient No answer no voicemail Change in schedule for 11/02/17   Will try again at a later time

## 2017-10-30 NOTE — Telephone Encounter (Signed)
Attempted to call patient No answer no voicemail Change in schedule for 11/02/17   Will cancel appointment for Monday and await to hear back from patient

## 2017-11-02 ENCOUNTER — Ambulatory Visit: Payer: Medicare HMO | Admitting: Physician Assistant

## 2017-11-02 DIAGNOSIS — I481 Persistent atrial fibrillation: Secondary | ICD-10-CM | POA: Diagnosis not present

## 2017-11-02 DIAGNOSIS — J441 Chronic obstructive pulmonary disease with (acute) exacerbation: Secondary | ICD-10-CM | POA: Diagnosis not present

## 2017-11-02 DIAGNOSIS — I5032 Chronic diastolic (congestive) heart failure: Secondary | ICD-10-CM | POA: Diagnosis not present

## 2017-11-02 DIAGNOSIS — I110 Hypertensive heart disease with heart failure: Secondary | ICD-10-CM | POA: Diagnosis not present

## 2017-11-02 DIAGNOSIS — J9621 Acute and chronic respiratory failure with hypoxia: Secondary | ICD-10-CM | POA: Diagnosis not present

## 2017-11-02 DIAGNOSIS — E119 Type 2 diabetes mellitus without complications: Secondary | ICD-10-CM | POA: Diagnosis not present

## 2017-11-02 DIAGNOSIS — I5023 Acute on chronic systolic (congestive) heart failure: Secondary | ICD-10-CM | POA: Diagnosis not present

## 2017-11-02 DIAGNOSIS — J9622 Acute and chronic respiratory failure with hypercapnia: Secondary | ICD-10-CM | POA: Diagnosis not present

## 2017-11-02 DIAGNOSIS — I272 Pulmonary hypertension, unspecified: Secondary | ICD-10-CM | POA: Diagnosis not present

## 2017-11-03 ENCOUNTER — Telehealth: Payer: Self-pay | Admitting: Family Medicine

## 2017-11-03 ENCOUNTER — Other Ambulatory Visit: Payer: Self-pay

## 2017-11-03 DIAGNOSIS — I5032 Chronic diastolic (congestive) heart failure: Secondary | ICD-10-CM | POA: Diagnosis not present

## 2017-11-03 DIAGNOSIS — I481 Persistent atrial fibrillation: Secondary | ICD-10-CM | POA: Diagnosis not present

## 2017-11-03 DIAGNOSIS — E119 Type 2 diabetes mellitus without complications: Secondary | ICD-10-CM | POA: Diagnosis not present

## 2017-11-03 DIAGNOSIS — J441 Chronic obstructive pulmonary disease with (acute) exacerbation: Secondary | ICD-10-CM | POA: Diagnosis not present

## 2017-11-03 DIAGNOSIS — J9622 Acute and chronic respiratory failure with hypercapnia: Secondary | ICD-10-CM | POA: Diagnosis not present

## 2017-11-03 DIAGNOSIS — I272 Pulmonary hypertension, unspecified: Secondary | ICD-10-CM | POA: Diagnosis not present

## 2017-11-03 DIAGNOSIS — I5023 Acute on chronic systolic (congestive) heart failure: Secondary | ICD-10-CM | POA: Diagnosis not present

## 2017-11-03 DIAGNOSIS — J9621 Acute and chronic respiratory failure with hypoxia: Secondary | ICD-10-CM | POA: Diagnosis not present

## 2017-11-03 DIAGNOSIS — I110 Hypertensive heart disease with heart failure: Secondary | ICD-10-CM | POA: Diagnosis not present

## 2017-11-03 NOTE — Telephone Encounter (Signed)
Verbal orders given  

## 2017-11-03 NOTE — Patient Outreach (Signed)
[facility]) Care Management  11/03/2017  TOWANNA AVERY 26-Jul-1945 [id number]     Transition of Care Referral  Referral Date: 11/03/17 Referral Source: [employer] Discharge Report Date of Admission: 10/22/17 Diagnosis: CHF Date of Discharge: 11/01/17 Facility: [facility] Insurance: [employer]    Outreach attempt # 1 to patient. Spoke with patient. She requested a brief call as she was still resting. She voices that she is doing fairly well since return home. Her only compliant is that she is tired and weak. She voices that she is alone but has very supportive dtr who helps her out when needed. [facility] aide and RN services in place and patient voiced that they were scheduled to make a home visit later today. She voices that her follow up appts were scheduled prior to her discharge. She reports that her dtr will be taking her to appts. Patient stets she was set up with HF clinic in [place] and received a call from them yesterday. However,patietn voices that she ws already and recently established care with HF clinic in [place]. She desires to continue with clinic in [place] as that is closer to her home and easier for her to get too. She will call HF clinic to make them aware of this. She voices that she has all her meds and denies any issues or concerns regarding them. Patient does not feel like she needs any THN assistance at this time but was appreciative of follow up call to check on her.    Plan: RN CM will close case at this time as no further interventions needed.   Enzo Montgomery, RN,BSN,CCM [facility] Telephonic Care Management Coordinator Direct Phone: [phone] Toll Free: [phone] Fax: [phone]

## 2017-11-03 NOTE — Telephone Encounter (Signed)
Please advise 

## 2017-11-03 NOTE — Telephone Encounter (Signed)
Verbal orders can be given. 

## 2017-11-03 NOTE — Telephone Encounter (Signed)
Copied from [place] #[id number]. Topic: General - Other >> Nov 03, 2017  8:56 AM Yvette Rack wrote: Reason for CRM: Nurse Lauren from [facility] [phone] is calling for   Nurse Orders for 2 week 4  1 week 4 and 3 PRN visits  Patient would also like for a home health aid for  3 week 2 2 week 4 1 week 2

## 2017-11-04 ENCOUNTER — Ambulatory Visit: Payer: Medicare HMO | Admitting: Family

## 2017-11-05 ENCOUNTER — Other Ambulatory Visit: Payer: Self-pay

## 2017-11-05 ENCOUNTER — Emergency Department: Payer: Medicare HMO

## 2017-11-05 ENCOUNTER — Telehealth: Payer: Self-pay | Admitting: Family Medicine

## 2017-11-05 ENCOUNTER — Encounter: Payer: Self-pay | Admitting: Emergency Medicine

## 2017-11-05 ENCOUNTER — Emergency Department
Admission: EM | Admit: 2017-11-05 | Discharge: 2017-11-05 | Disposition: A | Discharge status: Home / Self Care | Payer: Medicare HMO | Attending: Emergency Medicine | Admitting: Emergency Medicine

## 2017-11-05 DIAGNOSIS — N186 End stage renal disease: Secondary | ICD-10-CM | POA: Diagnosis not present

## 2017-11-05 DIAGNOSIS — J441 Chronic obstructive pulmonary disease with (acute) exacerbation: Secondary | ICD-10-CM | POA: Diagnosis not present

## 2017-11-05 DIAGNOSIS — R0602 Shortness of breath: Secondary | ICD-10-CM | POA: Diagnosis not present

## 2017-11-05 DIAGNOSIS — I509 Heart failure, unspecified: Secondary | ICD-10-CM

## 2017-11-05 DIAGNOSIS — I5022 Chronic systolic (congestive) heart failure: Secondary | ICD-10-CM | POA: Insufficient documentation

## 2017-11-05 DIAGNOSIS — I5032 Chronic diastolic (congestive) heart failure: Secondary | ICD-10-CM | POA: Diagnosis not present

## 2017-11-05 DIAGNOSIS — R079 Chest pain, unspecified: Secondary | ICD-10-CM | POA: Diagnosis not present

## 2017-11-05 DIAGNOSIS — I110 Hypertensive heart disease with heart failure: Secondary | ICD-10-CM | POA: Diagnosis not present

## 2017-11-05 DIAGNOSIS — I481 Persistent atrial fibrillation: Secondary | ICD-10-CM | POA: Diagnosis not present

## 2017-11-05 DIAGNOSIS — J9622 Acute and chronic respiratory failure with hypercapnia: Secondary | ICD-10-CM | POA: Diagnosis not present

## 2017-11-05 DIAGNOSIS — J9621 Acute and chronic respiratory failure with hypoxia: Secondary | ICD-10-CM | POA: Diagnosis not present

## 2017-11-05 DIAGNOSIS — I132 Hypertensive heart and chronic kidney disease with heart failure and with stage 5 chronic kidney disease, or end stage renal disease: Secondary | ICD-10-CM | POA: Insufficient documentation

## 2017-11-05 DIAGNOSIS — I5033 Acute on chronic diastolic (congestive) heart failure: Secondary | ICD-10-CM | POA: Diagnosis not present

## 2017-11-05 DIAGNOSIS — E119 Type 2 diabetes mellitus without complications: Secondary | ICD-10-CM | POA: Insufficient documentation

## 2017-11-05 DIAGNOSIS — I5023 Acute on chronic systolic (congestive) heart failure: Secondary | ICD-10-CM | POA: Diagnosis not present

## 2017-11-05 DIAGNOSIS — Z794 Long term (current) use of insulin: Secondary | ICD-10-CM | POA: Insufficient documentation

## 2017-11-05 DIAGNOSIS — I272 Pulmonary hypertension, unspecified: Secondary | ICD-10-CM | POA: Diagnosis not present

## 2017-11-05 LAB — GLUCOSE, CAPILLARY: Glucose-Capillary: 99 mg/dL (ref 65–99)

## 2017-11-05 LAB — BASIC METABOLIC PANEL
Anion gap: 10 (ref 5–15)
BUN: 10 mg/dL (ref 6–20)
CO2: 30 mmol/L (ref 22–32)
Calcium: 9.1 mg/dL (ref 8.9–10.3)
Chloride: 99 mmol/L — ABNORMAL LOW (ref 101–111)
Creatinine, Ser: 0.86 mg/dL (ref 0.44–1.00)
GFR calc Af Amer: >60 mL/min (ref >60)
GFR calc non Af Amer: >60 mL/min (ref >60)
Glucose, Bld: 118 mg/dL — ABNORMAL HIGH (ref 65–99)
Potassium: 3.5 mmol/L (ref 3.5–5.1)
Sodium: 139 mmol/L (ref 135–145)

## 2017-11-05 LAB — CBC WITH DIFFERENTIAL/PLATELET
Basophils Absolute: 0.1 10*3/uL (ref 0–0.1)
Basophils Relative: 1 %
Eosinophils Absolute: 0.2 10*3/uL (ref 0–0.7)
Eosinophils Relative: 3 %
HCT: 34 % — ABNORMAL LOW (ref 35.0–47.0)
Hemoglobin: 10.9 g/dL — ABNORMAL LOW (ref 12.0–16.0)
Lymphocytes Relative: 28 %
Lymphs Abs: 1.5 10*3/uL (ref 1.0–3.6)
MCH: 27.9 pg (ref 26.0–34.0)
MCHC: 31.9 g/dL — ABNORMAL LOW (ref 32.0–36.0)
MCV: 87.2 fL (ref 80.0–100.0)
Monocytes Absolute: 0.7 10*3/uL (ref 0.2–0.9)
Monocytes Relative: 12 %
Neutro Abs: 3.1 10*3/uL (ref 1.4–6.5)
Neutrophils Relative %: 56 %
Platelets: 284 10*3/uL (ref 150–440)
RBC: 3.9 MIL/uL (ref 3.80–5.20)
RDW: 16.2 % — ABNORMAL HIGH (ref 11.5–14.5)
WBC: 5.5 10*3/uL (ref 3.6–11.0)

## 2017-11-05 LAB — BRAIN NATRIURETIC PEPTIDE: B Natriuretic Peptide: 989 pg/mL — ABNORMAL HIGH (ref 0.0–100.0)

## 2017-11-05 LAB — TROPONIN I
Troponin I: 0.03 ng/mL (ref <0.03)
Troponin I: 0.04 ng/mL (ref <0.03)

## 2017-11-05 MED ORDER — FUROSEMIDE 10 MG/ML IJ SOLN
40.0000 mg | Freq: Once | INTRAMUSCULAR | Status: AC
  Administered 2017-11-05: 40 mg via INTRAVENOUS

## 2017-11-05 MED ORDER — FUROSEMIDE 10 MG/ML IJ SOLN
60.0000 mg | Freq: Once | INTRAMUSCULAR | Status: DC
  Filled 2017-11-05: qty 8

## 2017-11-05 NOTE — Telephone Encounter (Signed)
FYI

## 2017-11-05 NOTE — ED Notes (Signed)
Purewick placed by tech

## 2017-11-05 NOTE — ED Triage Notes (Signed)
Pt arrived with complaints of chest pain that started today at 1000.

## 2017-11-05 NOTE — Telephone Encounter (Signed)
Noted  

## 2017-11-05 NOTE — ED Provider Notes (Signed)
[facility] Emergency Department Provider Note  ___________________________________________   First MD Initiated Contact with Patient 11/05/17 1512     (approximate)  I have reviewed the triage vital signs and the nursing notes.   HISTORY  Chief Complaint Chest Pain   HPI Debra Saunders is a 72 y.o. female of atrial for ablation as well as congestive heart failure who was presented to the emergency department chest pain.  She says the pain started this morning and was characterized by 2, sharp and sudden right-sided episodes of chest pain which lasted about a second each and radiated to the left side of the chest.  She said this happened 3 times throughout the course the morning.  Says that she also has increased shortness of breath and has a decreased ability to walk ever since being discharged from rehab after a prolonged hospital stay for CHF.  Patient says that she has been compliant with her diuretics and says that she is urinating frequently.  Says that she has chronic swelling to the left lower externally but says that she has noted increased swelling to the right lower extremity as well.   Past Medical History:  Diagnosis Date  . Arthritis   . Atrial fibrillation, persistent ([place])    a. s/p TEE-guided DCCV on 08/20/2017  . Chest pain    a. 01/2012 Cath: nonobs dzs;  c. 04/2014 MV: No ischemia.  . Chronic back pain   . Chronic combined systolic (congestive) and diastolic (congestive) heart failure ([place])    a. Dx 2005 @ [facility];  b. 02/2014 Echo: Ef 55-60%, no rwma, mild MR, PASP 67mmHg. c. 07/2017: EF 30-35%, diffuse HK, trivial AI, severe MR, moderate TR  . Chronic respiratory failure (HCC)    a. on home O2  . COPD (chronic obstructive pulmonary disease) ([place])    a. Home O2 - 3lpm  . Gastritis   . History of intracranial hemorrhage    a. 6/14 described by neurosurgery as "small frontal posttraumatic SAH " - pt was on xarelto @ the time.  .  Hyperlipidemia   . Hypertension   . Lipoma of back   . Lymphedema    a. Chronic LLE edema.  . Obesity   . Sepsis with metabolic encephalopathy ([place])   . Sleep apnea   . Streptococcal bacteremia   . Stroke ([place])   . Symptomatic bradycardia    a. s/p BSX PPM.  . Thyroid nodule   . Type II diabetes mellitus ([place])   . Urinary incontinence     Patient Active Problem List   Diagnosis Date Noted  . Acute renal failure ([place])   . ESRD (end stage renal disease) on dialysis ([place])   . Shortness of breath   . CHF (congestive heart failure) ([place]) 09/30/2017  . Atrial fibrillation ([place]) 09/22/2017  . Acute bilateral low back pain without sciatica 01/28/2017  . Pain in limb 07/22/2016  . Neuropathy 06/09/2016  . Pain in the chest 03/10/2016  . UTI (urinary tract infection) 03/03/2016  . Chronic deep vein thrombosis (DVT) (HCC) 03/03/2016  . Chronic back pain 10/19/2015  . Goiter 04/25/2015  . Cervical disc disorder with radiculopathy of cervical region 03/30/2015  . Elevated LFTs 06/29/2014  . 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 Abnormality of gait 01/13/2014  . Sinoatrial node dysfunction ([place]) 01/09/2014  . Allergic rhinitis 10/26/2013  . Lymphedema 08/03/2013  . Constipation 04/12/2013  . Pulmonary embolus ([place]) 01/19/2013  . Subarachnoid hemorrhage ([place]) 01/19/2013  .  Musculoskeletal chest pain 01/19/2013  . Preop cardiovascular exam 10/19/2012  . Anemia 04/28/2012  . Chronic respiratory failure with hypoxia ([place]) 04/26/2012  . Diabetes ([place]) 03/30/2012  . Pulmonary hypertension ([place]) 03/30/2012  . Hyperlipidemia 03/30/2012  . Screening for breast cancer 03/30/2012  . Hypothyroidism 03/30/2012  . Chronic systolic heart failure ([place]) 01/22/2012  . Hypertension     Past Surgical History:  Procedure Laterality Date  . ABDOMINAL HYSTERECTOMY  1969  . CARDIAC CATHETERIZATION  2013   @ [facility]: No obstructive CAD: Only 20% ostial left circumflex.  [name] CARDIOVERSION N/A  10/12/2017   Procedure: CARDIOVERSION;  Surgeon: Minna Merritts, MD;  Location: [facility] ORS;  Service: Cardiovascular;  Laterality: N/A;  . CHOLECYSTECTOMY    . CYST REMOVAL TRUNK  2002   BACK  . DIALYSIS/PERMA CATHETER INSERTION N/A 10/06/2017   Procedure: DIALYSIS/PERMA CATHETER INSERTION;  Surgeon: Katha Cabal, MD;  Location: [facility];  Service: Cardiovascular;  Laterality: N/A;  . INSERT / REPLACE / REMOVE PACEMAKER    . lipoma removal  2014   back  . PACEMAKER INSERTION  [address] [employer] dual chamber pacemaker implanted by Dr Caryl Comes for symptomatic bradycardia  . PERMANENT PACEMAKER INSERTION N/A 01/09/2014   Procedure: PERMANENT PACEMAKER INSERTION;  Surgeon: Deboraha Sprang, MD;  Location: [facility] CATH LAB;  Service: Cardiovascular;  Laterality: N/A;  . RIGHT/LEFT HEART CATH AND CORONARY ANGIOGRAPHY N/A 10/19/2017   Procedure: RIGHT/LEFT HEART CATH AND CORONARY ANGIOGRAPHY;  Surgeon: Wellington Hampshire, MD;  Location: [facility];  Service: Cardiovascular;  Laterality: N/A;  . TEE WITHOUT CARDIOVERSION N/A 08/20/2017   Procedure: TRANSESOPHAGEAL ECHOCARDIOGRAM (TEE) & Direct current cardioversion;  Surgeon: Minna Merritts, MD;  Location: [facility] ORS;  Service: Cardiovascular;  Laterality: N/A;  . TRIGGER FINGER RELEASE    . VAGINAL HYSTERECTOMY      Prior to Admission medications   Medication Sig Start Date End Date Taking? Authorizing Provider  acetaminophen (TYLENOL) 325 MG tablet Take 325-650 mg by mouth every 6 (six) hours as needed for mild pain. Reported on 10/10/2015    [provider]  albuterol (PROVENTIL) (2.5 MG/3ML) 0.083% nebulizer solution Take 3 mLs (2.5 mg total) by nebulization every 6 (six) hours. 08/28/17   Loletha Grayer, MD  amiodarone (PACERONE) 200 MG tablet Take 1 tablet (200 mg total) by mouth daily. 10/23/17   Max Sane, MD  apixaban (ELIQUIS) 5 MG TABS tablet Take 1 tablet (5 mg total) by mouth 2 (two) times daily. 09/23/17    [name], MD  carvedilol (COREG) 3.125 MG tablet Take 1 tablet (3.125 mg total) by mouth 2 (two) times daily with a meal. 10/22/17   Max Sane, MD  Dulaglutide ([name]) 5.36 RW/4.3XV SOPN Inject 0.75 mg into the skin once a week. 12/29/16   [name], MD  gabapentin (NEURONTIN) 100 MG capsule TAKE 2 CAPSULES BY MOUTH THREE TIMES DAILY 08/12/17   [name], MD  glucose blood (TRUE METRIX BLOOD GLUCOSE TEST) test strip Check blood glucose twice daily Dx:E11.8, Z79.4 09/17/17   [name], MD  losartan (COZAAR) 25 MG tablet Take 1 tablet (25 mg total) by mouth daily. 08/29/17   Loletha Grayer, MD  Menthol, Topical Analgesic, (BIOFREEZE EX) Apply 1 application topically as needed (for pain).     [provider]  metFORMIN (GLUCOPHAGE) 500 MG tablet Take 1,000 mg by mouth 2 (two) times daily with a meal.    [provider]  ondansetron (ZOFRAN) 8 MG tablet Take 1 tablet (8 mg total) by mouth every 8 (eight) hours as needed for nausea or vomiting. 09/25/14   Jackolyn Confer, MD  pantoprazole (PROTONIX) 40 MG tablet Take 1 tablet (40 mg total) by mouth daily. 10/23/17   Max Sane, MD  potassium chloride SA (K-DUR,KLOR-CON) 20 MEQ tablet Take 1 tablet (20 mEq total) by mouth daily. 08/28/17   Loletha Grayer, MD  protein supplement shake (PREMIER PROTEIN) LIQD Take 325 mLs (11 oz total) by mouth 2 (two) times daily between meals. 08/28/17   Loletha Grayer, MD  rosuvastatin (CRESTOR) 10 MG tablet Take 1 tablet (10 mg total) by mouth daily. 12/29/16   [name], MD  sildenafil (REVATIO) 20 MG tablet Take 20 mg by mouth 3 (three) times daily. 05/20/17   [provider]  spironolactone (ALDACTONE) 25 MG tablet Take 0.5 tablets (12.5 mg total) by mouth daily. Patient taking differently: Take 25 mg by mouth daily.  08/28/17   Loletha Grayer, MD  torsemide (DEMADEX) 20 MG tablet Take 1 tablet (20 mg total) by mouth daily. Patient taking  differently: Take 20MG  by mouth and alternate with 40MG  every other day 09/14/17 12/13/17  Erma Heritage, PA-C    Allergies Aspirin; Other; and Penicillins  Family History  Problem Relation Age of Onset  . Heart disease Mother   . Hypertension Mother   . COPD Mother        was a smoker  . Thyroid disease Mother   . Hypertension Father   . Hypertension Sister   . Thyroid disease Sister   . Hypertension Sister   . Thyroid disease Sister   . Breast cancer Maternal Aunt   . Kidney disease Neg Hx   . Bladder Cancer Neg Hx     Social History Social History   Tobacco Use  . Smoking status: Former Smoker    Packs/day: 0.30    Years: 2.00    Pack years: 0.60    Types: Cigarettes    Last attempt to quit: 06/23/1990    Years since quitting: 27.3  . Smokeless tobacco: Never Used  Substance Use Topics  . Alcohol use: No  . Drug use: No    Review of Systems  Constitutional: No fever/chills Eyes: No visual changes. ENT: No sore throat. Cardiovascular: As above Respiratory: As above Gastrointestinal: No abdominal pain.  No nausea, no vomiting.  No diarrhea.  No constipation. Genitourinary: Negative for dysuria. Musculoskeletal: Negative for back pain. Skin: Negative for rash. Neurological: Negative for headaches, focal weakness or numbness.   ____________________________________________   PHYSICAL EXAM:  VITAL SIGNS: ED Triage Vitals  Enc Vitals Group     BP 11/05/17 1516 (!) 178/109     Pulse Rate 11/05/17 1513 66     Resp 11/05/17 1513 15     Temp 11/05/17 1513 98.2 F (36.8 C)     Temp Source 11/05/17 1513 Oral     SpO2 11/05/17 1513 96 %     Weight 11/05/17 1514 230 lb (104.3 kg)     Height 11/05/17 1514 5\' 1"  (1.549 m)     Head Circumference --      [facility] Flow --      Pain Score 11/05/17 1514 6     Pain Loc --      Pain Edu? --      Excl. in [place]? --     Constitutional: Alert and oriented. Well appearing and in no acute distress. Eyes:  Conjunctivae  are normal.  Head: Atraumatic. Nose: No congestion/rhinnorhea. Mouth/Throat: Mucous membranes are moist.  Neck: No stridor.   Cardiovascular: Normal rate, regular rhythm. Grossly normal heart sounds.  Reproducible chest pain over the right side of the chest. Respiratory: Normal respiratory effort.  No retractions. Lungs CTAB. Gastrointestinal: Soft and nontender. No distention. No CVA tenderness. Musculoskeletal: Mild to moderate right lower extremity edema from ankle to just distal to the knee.  Left lower edema is moderate to severe. Neurologic:  Normal speech and language. No gross focal neurologic deficits are appreciated. Skin:  Skin is warm, dry and intact. No rash noted. Psychiatric: Mood and affect are normal. Speech and behavior are normal.  ____________________________________________   LABS (all labs ordered are listed, but only abnormal results are displayed)  Labs Reviewed  CBC WITH DIFFERENTIAL/PLATELET - Abnormal; Notable for the following components:      Result Value   Hemoglobin 10.9 (*)    HCT 34.0 (*)    MCHC 31.9 (*)    RDW 16.2 (*)    All other components within normal limits  BRAIN NATRIURETIC PEPTIDE - Abnormal; Notable for the following components:   B Natriuretic Peptide 989.0 (*)    All other components within normal limits  TROPONIN I - Abnormal; Notable for the following components:   Troponin I 0.03 (*)    All other components within normal limits  BASIC METABOLIC PANEL - Abnormal; Notable for the following components:   Chloride 99 (*)    Glucose, Bld 118 (*)    All other components within normal limits  TROPONIN I - Abnormal; Notable for the following components:   Troponin I 0.04 (*)    All other components within normal limits  GLUCOSE, CAPILLARY   ____________________________________________  EKG  ED ECG REPORT I, Doran Stabler, the attending physician, personally viewed and interpreted this ECG.   Date: 11/05/2017  EKG Time:  1512  Rate: 72  Rhythm: Atrial paced complexes.  Multiple PVCs.  Axis: Normal  Intervals:none  ST&T Change: No ST segment elevation or depression.  T wave inversions in 2 as well as aVF as well as V4 through V6.  No significant change from previous.  ____________________________________________  RADIOLOGY  Chest x-ray with pulmonary vascular congestion with a degree of interstitial edema. ____________________________________________   PROCEDURES  Procedure(s) performed: None  Procedures  Critical Care performed: No  ____________________________________________   INITIAL IMPRESSION / ASSESSMENT AND PLAN / ED COURSE  Pertinent labs & imaging results that were available during my care of the patient were reviewed by me and considered in my medical decision making (see chart for details).  Differential includes, but is not limited to, viral syndrome, bronchitis including COPD exacerbation, pneumonia, reactive airway disease including asthma, CHF including exacerbation with or without pulmonary/interstitial edema, pneumothorax, ACS, thoracic trauma, and pulmonary embolism. As part of my medical decision making, I reviewed the following data within the electronic MEDICAL RECORD NUMBER Notes from prior ED visits  ----------------------------------------- 8:32 PM on 11/05/2017 -----------------------------------------  Patient at this time denies any shortness of breath.  Reacting very well to the 40 mg of Lasix.  Has had several 100 mL's of urine production.  Describing chest soreness but is tender to palpation.  Troponin stable with an unchanged EKG.  Likely secondary to her shortness of breath she has developed muscle soreness of the chest wall.  I also discussed the case with Dr. Rockey Situ.  The initial plan was for the patient to take her 49  mg of torsemide twice daily.  However, the patient is now revealing that she only takes 20 mg daily.  She is supposed to be taking 20 mg and 40 mg and  20 mg and 40 mg, alternating days.  Because she feels at her baseline with her shortness of breath currently we agreed for the next 2 days that she would take 40 mg of torsemide and then resume her alternating 20 mg 40 mg dose.  She will follow-up with Dr. Fletcher Anon.  The patient as well as family understanding of the plan willing to comply. ____________________________________________   FINAL CLINICAL IMPRESSION(S) / ED DIAGNOSES  CHF.    NEW MEDICATIONS STARTED DURING THIS VISIT:  New Prescriptions   No medications on file     Note:  This document was prepared using Dragon voice recognition software and may include unintentional dictation errors.     Orbie Pyo, MD 11/05/17 2033

## 2017-11-05 NOTE — Telephone Encounter (Signed)
fyi

## 2017-11-05 NOTE — Telephone Encounter (Unsigned)
Copied from [place] [id number]. Topic: Quick Communication - See Telephone Encounter >> Nov 05, 2017  2:28 PM Hewitt Shorts wrote: [facility] is calling to let Debra Saunders be aware that pt was on the way to the hospital by EMS with a blood pressure of 212/110 and having chest pain .,.  EMS was at the patient home as the call was going on   [place] [phone]

## 2017-11-06 ENCOUNTER — Telehealth: Payer: Self-pay | Admitting: Family Medicine

## 2017-11-06 ENCOUNTER — Telehealth: Payer: Self-pay | Admitting: Nurse Practitioner

## 2017-11-06 DIAGNOSIS — I481 Persistent atrial fibrillation: Secondary | ICD-10-CM | POA: Diagnosis not present

## 2017-11-06 DIAGNOSIS — E119 Type 2 diabetes mellitus without complications: Secondary | ICD-10-CM | POA: Diagnosis not present

## 2017-11-06 DIAGNOSIS — I5023 Acute on chronic systolic (congestive) heart failure: Secondary | ICD-10-CM | POA: Diagnosis not present

## 2017-11-06 DIAGNOSIS — I5032 Chronic diastolic (congestive) heart failure: Secondary | ICD-10-CM | POA: Diagnosis not present

## 2017-11-06 DIAGNOSIS — I110 Hypertensive heart disease with heart failure: Secondary | ICD-10-CM | POA: Diagnosis not present

## 2017-11-06 DIAGNOSIS — I272 Pulmonary hypertension, unspecified: Secondary | ICD-10-CM | POA: Diagnosis not present

## 2017-11-06 DIAGNOSIS — J9622 Acute and chronic respiratory failure with hypercapnia: Secondary | ICD-10-CM | POA: Diagnosis not present

## 2017-11-06 DIAGNOSIS — J9621 Acute and chronic respiratory failure with hypoxia: Secondary | ICD-10-CM | POA: Diagnosis not present

## 2017-11-06 DIAGNOSIS — J441 Chronic obstructive pulmonary disease with (acute) exacerbation: Secondary | ICD-10-CM | POA: Diagnosis not present

## 2017-11-06 NOTE — Telephone Encounter (Addendum)
Copied from [place] [id number]. Topic: Inquiry >> Nov 06, 2017  9:43 AM Lennox Solders wrote: Reason for CRM: Merry Proud PT from [facility] is calling requesting  Orders for one time  a week for 1 wk and then recertify for twice a wk for 3 wks and finish with one time a wk for 2 wk.FYI  Pt [facility] for bp 212/110

## 2017-11-06 NOTE — Telephone Encounter (Signed)
Please advise 

## 2017-11-06 NOTE — Telephone Encounter (Signed)
[name] with [facility] calling.   *STAT* If patient is at the pharmacy, call can be transferred to refill team.   1. Which medications need to be refilled? (please list name of each medication and dose if known)  eliquis 5 mg twice a day   2. Which pharmacy/location (including street and city if local pharmacy) is medication to be sent to? [facility] on [address]   3. Do they need a 30 day or 90 day supply? 30day

## 2017-11-06 NOTE — Telephone Encounter (Signed)
Verbal orders given, patient is scheduled with cardiology

## 2017-11-06 NOTE — Telephone Encounter (Signed)
Verbal orders can be given. 

## 2017-11-06 NOTE — Telephone Encounter (Signed)
Refill Request.  

## 2017-11-06 NOTE — Telephone Encounter (Signed)
Verbal orders given  

## 2017-11-06 NOTE — Telephone Encounter (Signed)
fyi please advise about orders

## 2017-11-06 NOTE — Telephone Encounter (Signed)
Verbal orders can be given.  Patient should have follow-up with cardiology given her ED visit.  Please check with the patient to make sure she has that follow-up arranged.  Thanks.

## 2017-11-06 NOTE — Telephone Encounter (Signed)
Debra Saunders, PT with [facility] calling back and states pt was seen at [facility] ED yesterday for high BP but was discharged home yesterday from the ED.

## 2017-11-06 NOTE — Telephone Encounter (Signed)
Copied from [place] [id number]. Topic: Quick Communication - See Telephone Encounter >> Nov 06, 2017 11:17 AM Aurelio Brash B wrote: CRM for notification. See Telephone encounter for: 11/06/17.  Sherlynn Stalls, OT with [facility] has completed  OT eval ,  now needs verbal orders for OT  2 x 3  weeks ,  her contact number is [phone]

## 2017-11-09 ENCOUNTER — Telehealth: Payer: Self-pay

## 2017-11-09 DIAGNOSIS — I5043 Acute on chronic combined systolic (congestive) and diastolic (congestive) heart failure: Secondary | ICD-10-CM | POA: Diagnosis not present

## 2017-11-09 DIAGNOSIS — I481 Persistent atrial fibrillation: Secondary | ICD-10-CM | POA: Diagnosis not present

## 2017-11-09 DIAGNOSIS — E1122 Type 2 diabetes mellitus with diabetic chronic kidney disease: Secondary | ICD-10-CM | POA: Diagnosis not present

## 2017-11-09 DIAGNOSIS — I130 Hypertensive heart and chronic kidney disease with heart failure and stage 1 through stage 4 chronic kidney disease, or unspecified chronic kidney disease: Secondary | ICD-10-CM | POA: Diagnosis not present

## 2017-11-09 DIAGNOSIS — J9621 Acute and chronic respiratory failure with hypoxia: Secondary | ICD-10-CM | POA: Diagnosis not present

## 2017-11-09 DIAGNOSIS — J9622 Acute and chronic respiratory failure with hypercapnia: Secondary | ICD-10-CM | POA: Diagnosis not present

## 2017-11-09 DIAGNOSIS — N183 Chronic kidney disease, stage 3 (moderate): Secondary | ICD-10-CM | POA: Diagnosis not present

## 2017-11-09 DIAGNOSIS — J441 Chronic obstructive pulmonary disease with (acute) exacerbation: Secondary | ICD-10-CM | POA: Diagnosis not present

## 2017-11-09 DIAGNOSIS — I272 Pulmonary hypertension, unspecified: Secondary | ICD-10-CM | POA: Diagnosis not present

## 2017-11-09 MED ORDER — APIXABAN 5 MG PO TABS: 5.0000 mg | ORAL_TABLET | Freq: Two times a day (BID) | ORAL | 6 refills | Status: DC

## 2017-11-09 NOTE — Telephone Encounter (Signed)
Verbal order given  

## 2017-11-09 NOTE — Telephone Encounter (Signed)
Okay to give verbal orders?  ?

## 2017-11-09 NOTE — Telephone Encounter (Signed)
Copied from [place] [id number]. Topic: Quick Communication - See Telephone Encounter >> Nov 06, 2017 11:17 AM Aurelio Brash B wrote: CRM for notification. See Telephone encounter for: 11/06/17.  Sherlynn Stalls, OT with [facility] has completed  OT eval ,  now needs verbal orders for OT  2 x 3  weeks ,  her contact number is [phone]   >> Nov 09, 2017  1:52 PM [name] wrote: Caller name:Esther  Relation to pt: OT from [facility]  Call back number: [phone]    Reason for call:  Requesting verbal orders for re cert, please advise

## 2017-11-09 NOTE — Telephone Encounter (Signed)
Please advise 

## 2017-11-10 DIAGNOSIS — I5043 Acute on chronic combined systolic (congestive) and diastolic (congestive) heart failure: Secondary | ICD-10-CM | POA: Diagnosis not present

## 2017-11-10 DIAGNOSIS — E1122 Type 2 diabetes mellitus with diabetic chronic kidney disease: Secondary | ICD-10-CM | POA: Diagnosis not present

## 2017-11-10 DIAGNOSIS — J441 Chronic obstructive pulmonary disease with (acute) exacerbation: Secondary | ICD-10-CM | POA: Diagnosis not present

## 2017-11-10 DIAGNOSIS — I481 Persistent atrial fibrillation: Secondary | ICD-10-CM | POA: Diagnosis not present

## 2017-11-10 DIAGNOSIS — I272 Pulmonary hypertension, unspecified: Secondary | ICD-10-CM | POA: Diagnosis not present

## 2017-11-10 DIAGNOSIS — N183 Chronic kidney disease, stage 3 (moderate): Secondary | ICD-10-CM | POA: Diagnosis not present

## 2017-11-10 DIAGNOSIS — I130 Hypertensive heart and chronic kidney disease with heart failure and stage 1 through stage 4 chronic kidney disease, or unspecified chronic kidney disease: Secondary | ICD-10-CM | POA: Diagnosis not present

## 2017-11-10 DIAGNOSIS — J9621 Acute and chronic respiratory failure with hypoxia: Secondary | ICD-10-CM | POA: Diagnosis not present

## 2017-11-10 DIAGNOSIS — J9622 Acute and chronic respiratory failure with hypercapnia: Secondary | ICD-10-CM | POA: Diagnosis not present

## 2017-11-11 DIAGNOSIS — I5043 Acute on chronic combined systolic (congestive) and diastolic (congestive) heart failure: Secondary | ICD-10-CM | POA: Diagnosis not present

## 2017-11-11 DIAGNOSIS — I130 Hypertensive heart and chronic kidney disease with heart failure and stage 1 through stage 4 chronic kidney disease, or unspecified chronic kidney disease: Secondary | ICD-10-CM | POA: Diagnosis not present

## 2017-11-11 DIAGNOSIS — N183 Chronic kidney disease, stage 3 (moderate): Secondary | ICD-10-CM | POA: Diagnosis not present

## 2017-11-11 DIAGNOSIS — I481 Persistent atrial fibrillation: Secondary | ICD-10-CM | POA: Diagnosis not present

## 2017-11-11 DIAGNOSIS — J9622 Acute and chronic respiratory failure with hypercapnia: Secondary | ICD-10-CM | POA: Diagnosis not present

## 2017-11-11 DIAGNOSIS — J9621 Acute and chronic respiratory failure with hypoxia: Secondary | ICD-10-CM | POA: Diagnosis not present

## 2017-11-11 DIAGNOSIS — I272 Pulmonary hypertension, unspecified: Secondary | ICD-10-CM | POA: Diagnosis not present

## 2017-11-11 DIAGNOSIS — J441 Chronic obstructive pulmonary disease with (acute) exacerbation: Secondary | ICD-10-CM | POA: Diagnosis not present

## 2017-11-11 DIAGNOSIS — E1122 Type 2 diabetes mellitus with diabetic chronic kidney disease: Secondary | ICD-10-CM | POA: Diagnosis not present

## 2017-11-12 ENCOUNTER — Encounter: Payer: Self-pay | Admitting: Nurse Practitioner

## 2017-11-12 ENCOUNTER — Ambulatory Visit: Payer: Medicare HMO | Admitting: Nurse Practitioner

## 2017-11-12 VITALS — BP 120/70 | HR 62 | Ht 60.0 in | Wt 210.0 lb

## 2017-11-12 DIAGNOSIS — I130 Hypertensive heart and chronic kidney disease with heart failure and stage 1 through stage 4 chronic kidney disease, or unspecified chronic kidney disease: Secondary | ICD-10-CM | POA: Diagnosis not present

## 2017-11-12 DIAGNOSIS — I5043 Acute on chronic combined systolic (congestive) and diastolic (congestive) heart failure: Secondary | ICD-10-CM | POA: Diagnosis not present

## 2017-11-12 DIAGNOSIS — I10 Essential (primary) hypertension: Secondary | ICD-10-CM

## 2017-11-12 DIAGNOSIS — E1122 Type 2 diabetes mellitus with diabetic chronic kidney disease: Secondary | ICD-10-CM | POA: Diagnosis not present

## 2017-11-12 DIAGNOSIS — N183 Chronic kidney disease, stage 3 (moderate): Secondary | ICD-10-CM | POA: Diagnosis not present

## 2017-11-12 DIAGNOSIS — I5042 Chronic combined systolic (congestive) and diastolic (congestive) heart failure: Secondary | ICD-10-CM | POA: Diagnosis not present

## 2017-11-12 DIAGNOSIS — J9621 Acute and chronic respiratory failure with hypoxia: Secondary | ICD-10-CM | POA: Diagnosis not present

## 2017-11-12 DIAGNOSIS — I481 Persistent atrial fibrillation: Secondary | ICD-10-CM | POA: Diagnosis not present

## 2017-11-12 DIAGNOSIS — J9622 Acute and chronic respiratory failure with hypercapnia: Secondary | ICD-10-CM | POA: Diagnosis not present

## 2017-11-12 DIAGNOSIS — I340 Nonrheumatic mitral (valve) insufficiency: Secondary | ICD-10-CM | POA: Diagnosis not present

## 2017-11-12 DIAGNOSIS — I4819 Other persistent atrial fibrillation: Secondary | ICD-10-CM

## 2017-11-12 DIAGNOSIS — N179 Acute kidney failure, unspecified: Secondary | ICD-10-CM | POA: Diagnosis not present

## 2017-11-12 DIAGNOSIS — I272 Pulmonary hypertension, unspecified: Secondary | ICD-10-CM | POA: Diagnosis not present

## 2017-11-12 DIAGNOSIS — I2721 Secondary pulmonary arterial hypertension: Secondary | ICD-10-CM

## 2017-11-12 DIAGNOSIS — J441 Chronic obstructive pulmonary disease with (acute) exacerbation: Secondary | ICD-10-CM | POA: Diagnosis not present

## 2017-11-12 MED ORDER — POTASSIUM CHLORIDE CRYS ER 20 MEQ PO TBCR: 20.0000 meq | EXTENDED_RELEASE_TABLET | Freq: Two times a day (BID) | ORAL | Status: DC

## 2017-11-12 NOTE — Patient Instructions (Signed)
Medication Instructions: - Your physician has recommended you make the following change in your medication:  1) Increase potassium 20 meq- take 1 tablet by mouth twice daily  Labwork: - none ordere  Procedures/Testing: - none ordered  Follow-Up: - Your physician recommends that you schedule a follow-up appointment in: 6 weeks with Dr. Fletcher Anon [name], NP.   Any Additional Special Instructions Will Be Listed Below (If Applicable).     If you need a refill on your cardiac medications before your next appointment, please call your pharmacy.

## 2017-11-12 NOTE — Progress Notes (Signed)
Office Visit    Patient Name: Debra Saunders Date of Encounter: 11/12/2017  Primary Care Provider:  [name], MD Primary Cardiologist:  [name], MD  Chief Complaint    72 year old female with a history of nonobstructive CAD, chronic combined systolic and diastolic congestive heart failure, persistent atrial fibrillation, symptomatic bradycardia status post permanent pacemaker, frequent PVCs, chronic respiratory failure with hypoxia on home O2, COPD, hypertension, hyperlipidemia, obesity, type 2 diabetes mellitus, traumatic subarachnoid hemorrhage in the setting of Xarelto, left lower extremity DVT status post IVC filter, chronic right-sided atypical chest pain, chronic left lower extremity lymphedema, prior stroke, sleep apnea, obesity, cardiorenal syndrome, and pulmonary hypertension, who presents for follow-up after recent prolonged hospitalization.  Past Medical History    Past Medical History:  Diagnosis Date  . Arthritis   . Atrial fibrillation, persistent ([place])    a. s/p TEE-guided DCCV on 08/20/2017  . Cardiorenal syndrome    a. 09/2017 Creat rose to 2.7 w/ diuresis-->HD x 2.  . Chest pain    a. 01/2012 Cath: nonobs dzs;  c. 04/2014 MV: No ischemia.  . Chronic back pain   . Chronic combined systolic (congestive) and diastolic (congestive) heart failure ([place])    a. Dx 2005 @ [facility];  b. 02/2014 Echo: Ef 55-60%, no rwma, mild MR, PASP 59mmHg. c. 07/2017: EF 30-35%, diffuse HK, trivial AI, severe MR, moderate TR; d. 09/2017 Echo: EF 40-45%, ant, antsep, ap, basal HK. Mod MR, mod dil LA, mod RV dil w/ mildly reduced fxn, mod dil RA, mod TR, PASP 36mmHg.  [name] Chronic respiratory failure (HCC)    a. on home O2  . COPD (chronic obstructive pulmonary disease) ([place])    a. Home O2 - 3lpm  . Gastritis   . History of intracranial hemorrhage    a. 6/14 described by neurosurgery as "small frontal posttraumatic SAH " - pt was on xarelto @ the time.  . Hyperlipidemia   .  Hypertension   . Lipoma of back   . Lymphedema    a. Chronic LLE edema.  [name] NICM (nonischemic cardiomyopathy) ([place])    a. 09/2017 Echo: EF 40-45%; b. 09/2017 Cath: minor, insignificant CAD.  [name] Obesity   . PAH (pulmonary artery hypertension) ([place])    a. 09/2017 Echo: PASP 17mmHg; b. 09/2017 RHC: RA 23, RV 84/13, PA 84/29, PCWP 20, CO 3.89, CI 1.89. LVEDP 18.  . Sepsis with metabolic encephalopathy ([place])   . Sleep apnea   . Streptococcal bacteremia   . Stroke ([place])   . Symptomatic bradycardia    a. s/p BSX PPM.  . Thyroid nodule   . Type II diabetes mellitus ([place])   . Urinary incontinence    Past Surgical History:  Procedure Laterality Date  . ABDOMINAL HYSTERECTOMY  1969  . CARDIAC CATHETERIZATION  2013   @ [place]: No obstructive CAD: Only 20% ostial left circumflex.  [name] CARDIOVERSION N/A 10/12/2017   Procedure: CARDIOVERSION;  Surgeon: Minna Merritts, MD;  Location: [facility] ORS;  Service: Cardiovascular;  Laterality: N/A;  . CHOLECYSTECTOMY    . CYST REMOVAL TRUNK  2002   BACK  . DIALYSIS/PERMA CATHETER INSERTION N/A 10/06/2017   Procedure: DIALYSIS/PERMA CATHETER INSERTION;  Surgeon: Katha Cabal, MD;  Location: [place] [facility];  Service: Cardiovascular;  Laterality: N/A;  . INSERT / REPLACE / REMOVE PACEMAKER    . lipoma removal  2014   back  . PACEMAKER INSERTION  [address] [employer] dual chamber pacemaker  implanted by Dr Caryl Comes for symptomatic bradycardia  . PERMANENT PACEMAKER INSERTION N/A 01/09/2014   Procedure: PERMANENT PACEMAKER INSERTION;  Surgeon: Deboraha Sprang, MD;  Location: [facility] CATH LAB;  Service: Cardiovascular;  Laterality: N/A;  . RIGHT/LEFT HEART CATH AND CORONARY ANGIOGRAPHY N/A 10/19/2017   Procedure: RIGHT/LEFT HEART CATH AND CORONARY ANGIOGRAPHY;  Surgeon: Wellington Hampshire, MD;  Location: [place] CV LAB;  Service: Cardiovascular;  Laterality: N/A;  . TEE WITHOUT CARDIOVERSION N/A 08/20/2017   Procedure: TRANSESOPHAGEAL ECHOCARDIOGRAM (TEE) &  Direct current cardioversion;  Surgeon: Minna Merritts, MD;  Location: [facility] ORS;  Service: Cardiovascular;  Laterality: N/A;  . TRIGGER FINGER RELEASE    . VAGINAL HYSTERECTOMY      Allergies  Allergies  Allergen Reactions  . Aspirin Hives and Other (See Comments)    Pt states that she is unable to take because she had bleeding in her brain.    . Other Other (See Comments)    Pt states that she is unable to take blood thinners because she had bleeding in her brain.  Blood Thinners-per doctor at [facility]  . Penicillins Hives, Shortness Of Breath, Swelling and Other (See Comments)    Has patient had a PCN reaction causing immediate rash, facial/tongue/throat swelling, SOB or lightheadedness with hypotension: Yes Has patient had a PCN reaction causing severe rash involving mucus membranes or skin necrosis: No Has patient had a PCN reaction that required hospitalization No Has patient had a PCN reaction occurring within the last 10 years: No If all of the above answers are "NO", then may proceed with Cephalosporin use.    History of Present Illness    72 year old female with the above complex past medical history as outlined above.  Most recently, she has been dealing with atrial fibrillation and chronic diastolic heart failure with volume overload, weight gain, and dyspnea.  She was hospitalized in February 2019, with heart failure and A. fib RVR.  During that admission, she was diuresed and after review of prior neurosurgical records, she was placed on Eliquis.  She later underwent TEE and cardioversion was noted to have intermittent paroxysmal A. fib post cardioversion but was subsequently discharged to a skilled nursing facility in sinus rhythm.  Following hospital discharge, she felt poorly and was volume overloaded at follow-up on March 25.  She was also noted to be back in atrial fibrillation.  Diuretics were changed to torsemide but unfortunately, she continued to have persistent  volume overload, dyspnea, orthopnea, and weight gain.  She was sent to the emergency room for admission on April 10 and was placed on IV Lasix.  She had some initial improvement but continued to have volume overload, even after cardioversion.  She then developed acute kidney injury.  She was seen by nephrology and required 2 separate sessions of hemodialysis.  Creatinine peaked at 2.7.  Repeat echo during hospitalization showed a pulmonary artery systolic pressure of 63.  As creatinine continued to improve with diuretics on hold, she underwent right and left heart catheterization on April 29 revealing minimal nonobstructive CAD and elevated right heart pressures, including a PA pressure of 84/29.  Wedge was 20.  It was felt that pulmonary hypertension was out of proportion to left-sided heart failure.  She was placed on low-dose torsemide, with recommendation for outpatient advanced heart failure follow-up.  Unfortunately, Ms. Tofte is unable to obtain a ride to [facility] for heart failure clinic.    [facility] at a weight of  231 pounds.  She had progressive weight loss while at [facility] and clinical improvement.  She was discharged home on Mother's Day and said she felt exceptionally well.  Weight had gone down to 206 pounds.  She has put on about 4 pounds since then, attributing this to just eating a little bit better.  She was seen in the emergency department on May 16 secondary to elevated blood pressure and upper chest congestion.  BNP was 989.  She was treated with a dose of IV Lasix with improved symptoms and blood pressure and subsequently discharged home.  Troponin was minimally elevated at that time at 0.03-0.04.  Since her ER visit, she has had no recurrence of elevated blood pressures and has been stable from a respiratory standpoint.  She uses oxygen around-the-clock and has not had any significant dyspnea.  She is chronic left lower extremity edema which is markedly  improved.  She has no right leg edema or abdominal bloating.  Overall, she is very pleased with how she is feeling.  She denies palpitations, PND, orthopnea, dizziness, syncope, or early satiety.  Home Medications    Prior to Admission medications   Medication Sig Start Date End Date Taking? Authorizing Provider  acetaminophen (TYLENOL) 325 MG tablet Take 325-650 mg by mouth every 6 (six) hours as needed for mild pain. Reported on 10/10/2015    [provider]  albuterol (PROVENTIL) (2.5 MG/3ML) 0.083% nebulizer solution Take 3 mLs (2.5 mg total) by nebulization every 6 (six) hours. 08/28/17   Loletha Grayer, MD  amiodarone (PACERONE) 200 MG tablet Take 1 tablet (200 mg total) by mouth daily. 10/23/17   Max Sane, MD  apixaban (ELIQUIS) 5 MG TABS tablet Take 1 tablet (5 mg total) by mouth 2 (two) times daily. 11/09/17   Wellington Hampshire, MD  carvedilol (COREG) 3.125 MG tablet Take 1 tablet (3.125 mg total) by mouth 2 (two) times daily with a meal. 10/22/17   Max Sane, MD  Dulaglutide ([name]) 1.61 WR/6.0AV SOPN Inject 0.75 mg into the skin once a week. 12/29/16   [name], MD  gabapentin (NEURONTIN) 100 MG capsule TAKE 2 CAPSULES BY MOUTH THREE TIMES DAILY 08/12/17   [name], MD  glucose blood (TRUE METRIX BLOOD GLUCOSE TEST) test strip Check blood glucose twice daily Dx:E11.8, Z79.4 09/17/17   [name], MD  losartan (COZAAR) 25 MG tablet Take 1 tablet (25 mg total) by mouth daily. 08/29/17   Loletha Grayer, MD  Menthol, Topical Analgesic, (BIOFREEZE EX) Apply 1 application topically as needed (for pain).     [provider]  metFORMIN (GLUCOPHAGE) 500 MG tablet Take 1,000 mg by mouth 2 (two) times daily with a meal.    [provider]  ondansetron (ZOFRAN) 8 MG tablet Take 1 tablet (8 mg total) by mouth every 8 (eight) hours as needed for nausea or vomiting. 09/25/14   Jackolyn Confer, MD  pantoprazole (PROTONIX) 40 MG tablet Take 1 tablet  (40 mg total) by mouth daily. 10/23/17   Max Sane, MD  potassium chloride SA (K-DUR,KLOR-CON) 20 MEQ tablet Take 1 tablet (20 mEq total) by mouth daily. 08/28/17   Loletha Grayer, MD  protein supplement shake (PREMIER PROTEIN) LIQD Take 325 mLs (11 oz total) by mouth 2 (two) times daily between meals. 08/28/17   Loletha Grayer, MD  rosuvastatin (CRESTOR) 10 MG tablet Take 1 tablet (10 mg total) by mouth daily. 12/29/16   [name], MD  sildenafil (REVATIO)  20 MG tablet Take 20 mg by mouth 3 (three) times daily. 05/20/17   [provider]  spironolactone (ALDACTONE) 25 MG tablet Take 0.5 tablets (12.5 mg total) by mouth daily. Patient taking differently: Take 25 mg by mouth daily.  08/28/17   Loletha Grayer, MD  torsemide (DEMADEX) 20 MG tablet Take 1 tablet (20 mg total) by mouth daily. Patient taking differently: Take 20MG  by mouth and alternate with 40MG  every other day 09/14/17 12/13/17  Erma Heritage, PA-C    Review of Systems    As above, doing much better.  She has chronic dyspnea on exertion but overall this is markedly improved.  She has chronic left lower extremity swelling though this is improved.  She otherwise denies palpitations, PND, orthopnea, dizziness, syncope, chest pain, or early satiety.  All other systems reviewed and are otherwise negative except as noted above.  Physical Exam    VS:  BP 120/70 (BP Location: Left Arm, Patient Position: Sitting, Cuff Size: Large)   Pulse 62   Ht 5' (1.524 m)   Wt 210 lb (95.3 kg)   BMI 41.01 kg/m  , BMI Body mass index is 41.01 kg/m. GEN: Obese, in no acute distress.  HEENT: normal.  Neck: Supple, no JVD, carotid bruits, or masses. Cardiac: RRR, 2/6 SEM throughotu, no rubs, or gallops. No clubbing, cyanosis.  Trace left lower leg edema.  Radials/PT 1+ and equal bilaterally.  Respiratory:  Respirations regular and unlabored, clear to auscultation bilaterally. GI: Soft, nontender, nondistended, BS + x 4. MS: no  deformity or atrophy. Skin: warm and dry, no rash. Neuro:  Strength and sensation are intact. Psych: Normal affect.  Accessory Clinical Findings    ECG - A paced, V sensed, 62, diffuse TWI. Similar to previous.  Prolonged QT.  Lab Results  Component Value Date   CREATININE 0.86 11/05/2017   BUN 10 11/05/2017   NA 139 11/05/2017   K 3.5 11/05/2017   CL 99 (L) 11/05/2017   CO2 30 11/05/2017    Lab Results  Component Value Date   WBC 5.5 11/05/2017   HGB 10.9 (L) 11/05/2017   HCT 34.0 (L) 11/05/2017   MCV 87.2 11/05/2017   PLT 284 11/05/2017     Assessment & Plan    1.  Chronic combined systolic and diastolic congestive heart failure: Patient was recently admitted for prolonged period of time in the setting of volume overload and cardiorenal syndrome, complicated by pulmonary hypertension.  Right heart catheterization toward the end of that admission showed moderate elevation of left heart filling pressures with significant pulmonary hypertension.  She has been taking torsemide 20 mill grams daily and tolerating this well.  Her discharge weight was 231 pounds and she is to 10 today.  She says she does lowest 206 a week or so ago.  Renal function was stable when checked on May 16.  She is euvolemic today and has been feeling well.  Continue current medical therapy including beta-blocker, ARB, Spironolactone, and torsemide.  As her potassium was the 3.5 on May 16, I have asked her to increase her supplementation to 20 mEq twice daily.  We discussed the importance of daily weights, sodium restriction, medication compliance, and symptom reporting and she verbalizes understanding.  She has follow-up with heart failure clinic next week, at which time we should repeat a basic metabolic panel.  2.  Pulmonary arterial hypertension: PA pressure 84/29 on right heart catheterization in late April.  She is on sildenafil and low-dose  torsemide.  She is also on around-the-clock oxygen.  She is followed  by pulmonology.  She had previously been referred to heart failure clinic in [place] however, she does not have a ride.  We discussed this again today and she reiterated that going there would not be feasible.  She will continue to follow in heart failure clinic here.  3.  Essential hypertension: Stable.  Recent ER visit for hypertensive urgency in the setting of what sounds like mild heart failure.  No recurrence.  4.  Hyperlipidemia: Continue statin therapy.  5.  COPD: No active wheezing.  Chronic home O2.  She uses inhalers as needed.  No recent need.  6.  Persistent atrial fibrillation: Patient underwent cardioversion during recent hospitalization.  She does not tolerate A. fib well and I suspect it was largely driving her heart failure symptoms/volume overload.  She remains on amiodarone and Eliquis.  Normal LFTs and thyroid function testing in February.  She sees pulmonology as an outpatient.   7.  Mitral regurgitation: Moderate mitral regurgitation in the setting of A. fib in April.  She is symptomatically stable.  Continue to follow.  8.  Recent acute kidney injury: In the setting of diuresis and cardiorenal syndrome.  Creatinine normal at 0.86 on May 16.  Plan to follow-up basic metabolic panel at next heart failure clinic.  9.  Disposition: Follow-up in clinic in 1 month.  She will need surveillance labs including LFTs and TSH.  Murray Hodgkins, NP 11/12/2017, 4:16 PM

## 2017-11-13 ENCOUNTER — Other Ambulatory Visit: Payer: Self-pay | Admitting: Family Medicine

## 2017-11-13 DIAGNOSIS — I272 Pulmonary hypertension, unspecified: Secondary | ICD-10-CM | POA: Diagnosis not present

## 2017-11-13 DIAGNOSIS — I5043 Acute on chronic combined systolic (congestive) and diastolic (congestive) heart failure: Secondary | ICD-10-CM | POA: Diagnosis not present

## 2017-11-13 DIAGNOSIS — J441 Chronic obstructive pulmonary disease with (acute) exacerbation: Secondary | ICD-10-CM | POA: Diagnosis not present

## 2017-11-13 DIAGNOSIS — N183 Chronic kidney disease, stage 3 (moderate): Secondary | ICD-10-CM | POA: Diagnosis not present

## 2017-11-13 DIAGNOSIS — J9622 Acute and chronic respiratory failure with hypercapnia: Secondary | ICD-10-CM | POA: Diagnosis not present

## 2017-11-13 DIAGNOSIS — I130 Hypertensive heart and chronic kidney disease with heart failure and stage 1 through stage 4 chronic kidney disease, or unspecified chronic kidney disease: Secondary | ICD-10-CM | POA: Diagnosis not present

## 2017-11-13 DIAGNOSIS — I509 Heart failure, unspecified: Secondary | ICD-10-CM | POA: Diagnosis not present

## 2017-11-13 DIAGNOSIS — I481 Persistent atrial fibrillation: Secondary | ICD-10-CM | POA: Diagnosis not present

## 2017-11-13 DIAGNOSIS — J9621 Acute and chronic respiratory failure with hypoxia: Secondary | ICD-10-CM | POA: Diagnosis not present

## 2017-11-13 DIAGNOSIS — E1122 Type 2 diabetes mellitus with diabetic chronic kidney disease: Secondary | ICD-10-CM | POA: Diagnosis not present

## 2017-11-16 DIAGNOSIS — N183 Chronic kidney disease, stage 3 (moderate): Secondary | ICD-10-CM | POA: Diagnosis not present

## 2017-11-16 DIAGNOSIS — E1122 Type 2 diabetes mellitus with diabetic chronic kidney disease: Secondary | ICD-10-CM | POA: Diagnosis not present

## 2017-11-16 DIAGNOSIS — J9622 Acute and chronic respiratory failure with hypercapnia: Secondary | ICD-10-CM | POA: Diagnosis not present

## 2017-11-16 DIAGNOSIS — I272 Pulmonary hypertension, unspecified: Secondary | ICD-10-CM | POA: Diagnosis not present

## 2017-11-16 DIAGNOSIS — I130 Hypertensive heart and chronic kidney disease with heart failure and stage 1 through stage 4 chronic kidney disease, or unspecified chronic kidney disease: Secondary | ICD-10-CM | POA: Diagnosis not present

## 2017-11-16 DIAGNOSIS — I481 Persistent atrial fibrillation: Secondary | ICD-10-CM | POA: Diagnosis not present

## 2017-11-16 DIAGNOSIS — J9621 Acute and chronic respiratory failure with hypoxia: Secondary | ICD-10-CM | POA: Diagnosis not present

## 2017-11-16 DIAGNOSIS — I5043 Acute on chronic combined systolic (congestive) and diastolic (congestive) heart failure: Secondary | ICD-10-CM | POA: Diagnosis not present

## 2017-11-16 DIAGNOSIS — J441 Chronic obstructive pulmonary disease with (acute) exacerbation: Secondary | ICD-10-CM | POA: Diagnosis not present

## 2017-11-16 NOTE — Progress Notes (Signed)
Patient ID: Debra Saunders, female    DOB: 1946/04/17, 73 y.o.   MRN: [id number]  HPI  Debra Saunders is a 72 y/o female with a history of DM, hyperlipidemia, HTN, stroke, thyroid disease, COPD, obstructive sleep apnea, lymphedema, atrial fibrillation, pulmonary HTN, former tobacco use and chronic heart failure.   Echo report from 10/13/17 reviewed and showed an EF of 40-45% along with moderate MR/TR and a severely elevated PA pressure of 65 mm Hg. TEE report from 08/20/17 reviewed and showed an EF of 35%  Was in the ED 11/05/17 due to HF exacerbation along with the chest pain. Was treated and released. Admitted 09/30/17 due to acute on chronic HF. Cardioverted due to atrial fibrillation. Severe pulmonary HTN and acute renal failure. Palliative care, vascular, nephrology and cardiology consults obtained. Recommended referral to [name] in [place]. Discharged after 22 days. Admitted 08/16/17 due to acute HF exacerbation. Initially needed bipap and then transitioned to 4L of oxygen on nasal cannula. Initially needed IV lasix and then transitioned to oral diuretics. Given prednisone taper for COPD exacerbation. Consulted pulmonology, EP and cardiology. Patient was cardioverted due to atrial fibrillation. Discharged after 12 days.   She presents today for a follow-up visit with a chief complaint of moderate fatigue upon minimal exertion. She says that this has been present for several years. She has associated cough, shortness of breath, pedal edema, abdominal distention, difficulty sleeping and slight weight gain. She denies any palpitations, chest pain or dizziness.   Past Medical History:  Diagnosis Date  . Arthritis   . Atrial fibrillation, persistent ([place])    a. s/p TEE-guided DCCV on 08/20/2017  . Cardiorenal syndrome    a. 09/2017 Creat rose to 2.7 w/ diuresis-->HD x 2.  . Chest pain    a. 01/2012 Cath: nonobs dzs;  c. 04/2014 MV: No ischemia.  . Chronic back pain   . Chronic combined systolic  (congestive) and diastolic (congestive) heart failure ([place])    a. Dx 2005 @ [facility];  b. 02/2014 Echo: Ef 55-60%, no rwma, mild MR, PASP 29mmHg. c. 07/2017: EF 30-35%, diffuse HK, trivial AI, severe MR, moderate TR; d. 09/2017 Echo: EF 40-45%, ant, antsep, ap, basal HK. Mod MR, mod dil LA, mod RV dil w/ mildly reduced fxn, mod dil RA, mod TR, PASP 72mmHg.  [name] Chronic respiratory failure (HCC)    a. on home O2  . COPD (chronic obstructive pulmonary disease) ([place])    a. Home O2 - 3lpm  . Gastritis   . History of intracranial hemorrhage    a. 6/14 described by neurosurgery as "small frontal posttraumatic SAH " - pt was on xarelto @ the time.  . Hyperlipidemia   . Hypertension   . Lipoma of back   . Lymphedema    a. Chronic LLE edema.  [name] NICM (nonischemic cardiomyopathy) ([place])    a. 09/2017 Echo: EF 40-45%; b. 09/2017 Cath: minor, insignificant CAD.  [name] Obesity   . PAH (pulmonary artery hypertension) ([place])    a. 09/2017 Echo: PASP 34mmHg; b. 09/2017 RHC: RA 23, RV 84/13, PA 84/29, PCWP 20, CO 3.89, CI 1.89. LVEDP 18.  . Sepsis with metabolic encephalopathy ([place])   . Sleep apnea   . Streptococcal bacteremia   . Stroke ([place])   . Symptomatic bradycardia    a. s/p BSX PPM.  . Thyroid nodule   . Type II diabetes mellitus ([place])   . Urinary incontinence    Past Surgical History:  Procedure Laterality  Date  . ABDOMINAL HYSTERECTOMY  1969  . CARDIAC CATHETERIZATION  2013   @ [facility]: No obstructive CAD: Only 20% ostial left circumflex.  [name] CARDIOVERSION N/A 10/12/2017   Procedure: CARDIOVERSION;  Surgeon: Minna Merritts, MD;  Location: [facility] ORS;  Service: Cardiovascular;  Laterality: N/A;  . CHOLECYSTECTOMY    . CYST REMOVAL TRUNK  2002   BACK  . DIALYSIS/PERMA CATHETER INSERTION N/A 10/06/2017   Procedure: DIALYSIS/PERMA CATHETER INSERTION;  Surgeon: Katha Cabal, MD;  Location: [place] [facility] LAB;  Service: Cardiovascular;  Laterality: N/A;  . INSERT / REPLACE / REMOVE PACEMAKER    . lipoma  removal  2014   back  . PACEMAKER INSERTION  [address] Scientific dual chamber pacemaker implanted by Dr Caryl Comes for symptomatic bradycardia  . PERMANENT PACEMAKER INSERTION N/A 01/09/2014   Procedure: PERMANENT PACEMAKER INSERTION;  Surgeon: Deboraha Sprang, MD;  Location: [facility] CATH LAB;  Service: Cardiovascular;  Laterality: N/A;  . RIGHT/LEFT HEART CATH AND CORONARY ANGIOGRAPHY N/A 10/19/2017   Procedure: RIGHT/LEFT HEART CATH AND CORONARY ANGIOGRAPHY;  Surgeon: [place] Hampshire, MD;  Location: [place] [facility];  Service: Cardiovascular;  Laterality: N/A;  . TEE WITHOUT CARDIOVERSION N/A 08/20/2017   Procedure: TRANSESOPHAGEAL ECHOCARDIOGRAM (TEE) & Direct current cardioversion;  Surgeon: Minna Merritts, MD;  Location: [facility] ORS;  Service: Cardiovascular;  Laterality: N/A;  . TRIGGER FINGER RELEASE    . VAGINAL HYSTERECTOMY     Family History  Problem Relation Age of Onset  . Heart disease Mother   . Hypertension Mother   . COPD Mother        was a smoker  . Thyroid disease Mother   . Hypertension Father   . Hypertension Sister   . Thyroid disease Sister   . Hypertension Sister   . Thyroid disease Sister   . Breast cancer Maternal Aunt   . Kidney disease Neg Hx   . Bladder Cancer Neg Hx    Social History   Tobacco Use  . Smoking status: Former Smoker    Packs/day: 0.30    Years: 2.00    Pack years: 0.60    Types: Cigarettes    Last attempt to quit: 06/23/1990    Years since quitting: 27.4  . Smokeless tobacco: Never Used  Substance Use Topics  . Alcohol use: No   Allergies  Allergen Reactions  . Aspirin Hives and Other (See Comments)    Pt states that she is unable to take because she had bleeding in her brain.    . Other Other (See Comments)    Pt states that she is unable to take blood thinners because she had bleeding in her brain.  Blood Thinners-per doctor at [facility]  . Penicillins Hives, Shortness Of Breath, Swelling and Other (See Comments)    Has  patient had a PCN reaction causing immediate rash, facial/tongue/throat swelling, SOB or lightheadedness with hypotension: Yes Has patient had a PCN reaction causing severe rash involving mucus membranes or skin necrosis: No Has patient had a PCN reaction that required hospitalization No Has patient had a PCN reaction occurring within the last 10 years: No If all of the above answers are "NO", then may proceed with Cephalosporin use.   Prior to Admission medications   Medication Sig Start Date End Date Taking? Authorizing Provider  acetaminophen (TYLENOL) 325 MG tablet Take 325-650 mg by mouth every 6 (six) hours as needed for mild pain. Reported on 10/10/2015   Yes [provider]  albuterol (PROVENTIL) (2.5 MG/3ML) 0.083% nebulizer solution Take 3 mLs (2.5 mg total) by nebulization every 6 (six) hours. 08/28/17  Yes Wieting, Richard, MD  amiodarone (PACERONE) 200 MG tablet Take 1 tablet (200 mg total) by mouth daily. 10/23/17  Yes Max Sane, MD  apixaban (ELIQUIS) 5 MG TABS tablet Take 1 tablet (5 mg total) by mouth 2 (two) times daily. 11/09/17  Yes [place] Hampshire, MD  carvedilol (COREG) 3.125 MG tablet Take 1 tablet (3.125 mg total) by mouth 2 (two) times daily with a meal. 10/22/17  Yes Max Sane, MD  Dulaglutide ([name]) 3.82 NK/5.3ZJ SOPN Inject 0.75 mg into the skin once a week. 12/29/16  Yes [name], MD  gabapentin (NEURONTIN) 100 MG capsule TAKE 2 CAPSULES BY MOUTH THREE TIMES DAILY Patient taking differently: TAKE 1 CAPSULES BY MOUTH THREE TIMES DAILY 08/12/17  Yes [name], MD  glucose blood (TRUE METRIX BLOOD GLUCOSE TEST) test strip Check blood glucose twice daily Dx:E11.8, Z79.4 09/17/17  Yes [name], MD  losartan (COZAAR) 25 MG tablet Take 1 tablet (25 mg total) by mouth daily. 08/29/17  Yes Wieting, Richard, MD  Menthol, Topical Analgesic, (BIOFREEZE EX) Apply 1 application topically as needed (for pain).    Yes [provider]   metFORMIN (GLUCOPHAGE) 500 MG tablet Take 500 mg by mouth 2 (two) times daily with a meal.    Yes [provider]  ondansetron (ZOFRAN) 8 MG tablet Take 1 tablet (8 mg total) by mouth every 8 (eight) hours as needed for nausea or vomiting. 09/25/14  Yes Jackolyn Confer, MD  pantoprazole (PROTONIX) 40 MG tablet Take 1 tablet (40 mg total) by mouth daily. 10/23/17  Yes Max Sane, MD  potassium chloride SA (K-DUR,KLOR-CON) 20 MEQ tablet Take 1 tablet (20 mEq total) by mouth 2 (two) times daily. 11/12/17  Yes Theora Gianotti, NP  protein supplement shake (PREMIER PROTEIN) LIQD Take 325 mLs (11 oz total) by mouth 2 (two) times daily between meals. 08/28/17  Yes Wieting, Richard, MD  rosuvastatin (CRESTOR) 10 MG tablet Take 1 tablet (10 mg total) by mouth daily. 12/29/16  Yes [name], MD  sildenafil (REVATIO) 20 MG tablet Take 20 mg by mouth 3 (three) times daily. 05/20/17  Yes [provider]  spironolactone (ALDACTONE) 25 MG tablet Take 0.5 tablets (12.5 mg total) by mouth daily. 08/28/17  Yes Wieting, Richard, MD  torsemide (DEMADEX) 20 MG tablet Take 1 tablet (20 mg total) by mouth daily. Patient taking differently: Take 20MG  by mouth. If gain 2 or more pounds overnight then take 40mg  09/14/17 12/13/17 Yes Strader, Fransisco Hertz, PA-C    Review of Systems  Constitutional: Positive for appetite change (feel full easy) and fatigue.  HENT: Negative for congestion, facial swelling and sore throat.   Eyes: Negative.   Respiratory: Positive for cough (mostly at night) and shortness of breath. Negative for chest tightness.   Cardiovascular: Positive for leg swelling. Negative for chest pain and palpitations.  Gastrointestinal: Positive for abdominal distention. Negative for abdominal pain.  Endocrine: Negative.   Genitourinary: Negative.   Skin: Negative.   Allergic/Immunologic: Negative.   Neurological: Negative for dizziness and light-headedness.  Hematological:  Negative for adenopathy. Does not bruise/bleed easily.  Psychiatric/Behavioral: Positive for sleep disturbance (wakes up often; wearing oxygen at 4L around the clock along with CPAP). Negative for dysphoric mood. The patient is not nervous/anxious.    Vitals:   11/18/17 1119  BP: 131/83  Pulse: 61  Resp: 18  SpO2: 96%  Weight: 212 lb (96.2 kg)  Height: 5' (1.524 m)   Wt Readings from Last 3 Encounters:  11/18/17 212 lb (96.2 kg)  11/12/17 210 lb (95.3 kg)  11/05/17 230 lb (104.3 kg)   Lab Results  Component Value Date   CREATININE 0.86 11/05/2017   CREATININE 0.98 10/22/2017   CREATININE 1.01 (H) 10/21/2017    Physical Exam  Constitutional: She is oriented to person, place, and time. She appears well-developed and well-nourished.  HENT:  Head: Normocephalic and atraumatic.  Neck: Normal range of motion. Neck supple. No JVD present.  Cardiovascular: Regular rhythm. Bradycardia present.  Pulmonary/Chest: Effort normal. She has no wheezes. She has no rales.  Abdominal: Soft. She exhibits no distension. There is no tenderness.  Musculoskeletal: She exhibits edema (1+ pitting edema in bilateral lower legs). She exhibits no tenderness.  Neurological: She is alert and oriented to person, place, and time.  Skin: Skin is warm and dry.  Psychiatric: She has a normal mood and affect. Her behavior is normal. Thought content normal.  Nursing note and vitals reviewed.  Assessment & Plan:  1: Chronic heart failure with reduced ejection fraction- - NYHA class III - mildly fluid overloaded today with lower extremity edema - weighing daily and she says that her weight has slowly risen. Instructed her to call for an overnight weight gain of >2 pounds or a weekly weight gain of >5 pounds - weight up 2 pounds from 6 days ago - not adding salt and she was reminded to closely follow a 2000mg  sodium diet - drinking 1 cup of coffee and 1-2 bottles of water daily  - taking torsemide 20mg  daily  with an additional 20mg  for weight gain. She says that she gained 2 pounds overnight last night and will take 40mg  torsemide today once she returns home - elevate legs when sitting for long periods of time and has been using her compression boots twice daily with good results - saw cardiology Sharolyn Douglas) 11/12/17 & potassium was increased to 10meq BID - follow-up with cardiology July - BNP on 11/05/17 was 989.0 - will get a BMP today since potassium was increased  2: HTN- - BP looks good today  - saw PCP [name]) 06/15/17 & returns 10/07/17 - BMP on 11/05/17 reviewed and showed sodium 139, potassium 3.5 and GFR >60  3: Diabetes- - fasting glucose at home was 125 - A1c on 08/16/17 was 5.6%  4: Pulmonary HTN- - wearing oxygen at 3L around the clock - saw pulmonologist Raul Del) 08/04/17 and returns June - discussed [facility] with patient and the recommendation for her to go there. She says that transportation to [place] is an issue. Discussed using [facility] since there's not anyone locally that specializes in this. She says that she will think about it and talk with her daughter.   5: Atrial fibrillation- - cardioverted during April admission - currently taking apixaban, digoxin & metoprolol  Patient did not bring her medications nor a list. Each medication was verbally reviewed with the patient and she was encouraged to bring the bottles to every visit to confirm accuracy of list.  Return in 3 months or sooner for any questions/problems before then.

## 2017-11-17 DIAGNOSIS — J441 Chronic obstructive pulmonary disease with (acute) exacerbation: Secondary | ICD-10-CM | POA: Diagnosis not present

## 2017-11-17 DIAGNOSIS — I130 Hypertensive heart and chronic kidney disease with heart failure and stage 1 through stage 4 chronic kidney disease, or unspecified chronic kidney disease: Secondary | ICD-10-CM | POA: Diagnosis not present

## 2017-11-17 DIAGNOSIS — J9621 Acute and chronic respiratory failure with hypoxia: Secondary | ICD-10-CM | POA: Diagnosis not present

## 2017-11-17 DIAGNOSIS — I272 Pulmonary hypertension, unspecified: Secondary | ICD-10-CM | POA: Diagnosis not present

## 2017-11-17 DIAGNOSIS — N183 Chronic kidney disease, stage 3 (moderate): Secondary | ICD-10-CM | POA: Diagnosis not present

## 2017-11-17 DIAGNOSIS — I5043 Acute on chronic combined systolic (congestive) and diastolic (congestive) heart failure: Secondary | ICD-10-CM | POA: Diagnosis not present

## 2017-11-17 DIAGNOSIS — E1122 Type 2 diabetes mellitus with diabetic chronic kidney disease: Secondary | ICD-10-CM | POA: Diagnosis not present

## 2017-11-17 DIAGNOSIS — J9622 Acute and chronic respiratory failure with hypercapnia: Secondary | ICD-10-CM | POA: Diagnosis not present

## 2017-11-17 DIAGNOSIS — I481 Persistent atrial fibrillation: Secondary | ICD-10-CM | POA: Diagnosis not present

## 2017-11-18 ENCOUNTER — Ambulatory Visit: Payer: Medicare HMO | Attending: Family | Admitting: Family

## 2017-11-18 ENCOUNTER — Encounter: Payer: Self-pay | Admitting: Family

## 2017-11-18 VITALS — BP 131/83 | HR 61 | Resp 18 | Ht 60.0 in | Wt 212.0 lb

## 2017-11-18 DIAGNOSIS — Z836 Family history of other diseases of the respiratory system: Secondary | ICD-10-CM | POA: Insufficient documentation

## 2017-11-18 DIAGNOSIS — Z950 Presence of cardiac pacemaker: Secondary | ICD-10-CM | POA: Diagnosis not present

## 2017-11-18 DIAGNOSIS — Z87891 Personal history of nicotine dependence: Secondary | ICD-10-CM | POA: Diagnosis not present

## 2017-11-18 DIAGNOSIS — Z8052 Family history of malignant neoplasm of bladder: Secondary | ICD-10-CM | POA: Diagnosis not present

## 2017-11-18 DIAGNOSIS — Z9889 Other specified postprocedural states: Secondary | ICD-10-CM | POA: Insufficient documentation

## 2017-11-18 DIAGNOSIS — E119 Type 2 diabetes mellitus without complications: Secondary | ICD-10-CM | POA: Diagnosis not present

## 2017-11-18 DIAGNOSIS — Z803 Family history of malignant neoplasm of breast: Secondary | ICD-10-CM | POA: Insufficient documentation

## 2017-11-18 DIAGNOSIS — E785 Hyperlipidemia, unspecified: Secondary | ICD-10-CM | POA: Diagnosis not present

## 2017-11-18 DIAGNOSIS — Z09 Encounter for follow-up examination after completed treatment for conditions other than malignant neoplasm: Secondary | ICD-10-CM | POA: Insufficient documentation

## 2017-11-18 DIAGNOSIS — Z9049 Acquired absence of other specified parts of digestive tract: Secondary | ICD-10-CM | POA: Diagnosis not present

## 2017-11-18 DIAGNOSIS — Z8673 Personal history of transient ischemic attack (TIA), and cerebral infarction without residual deficits: Secondary | ICD-10-CM | POA: Diagnosis not present

## 2017-11-18 DIAGNOSIS — Z8249 Family history of ischemic heart disease and other diseases of the circulatory system: Secondary | ICD-10-CM | POA: Diagnosis not present

## 2017-11-18 DIAGNOSIS — J441 Chronic obstructive pulmonary disease with (acute) exacerbation: Secondary | ICD-10-CM | POA: Insufficient documentation

## 2017-11-18 DIAGNOSIS — Z8349 Family history of other endocrine, nutritional and metabolic diseases: Secondary | ICD-10-CM | POA: Insufficient documentation

## 2017-11-18 DIAGNOSIS — Z841 Family history of disorders of kidney and ureter: Secondary | ICD-10-CM | POA: Insufficient documentation

## 2017-11-18 DIAGNOSIS — Z79891 Long term (current) use of opiate analgesic: Secondary | ICD-10-CM | POA: Insufficient documentation

## 2017-11-18 DIAGNOSIS — Z886 Allergy status to analgesic agent status: Secondary | ICD-10-CM | POA: Insufficient documentation

## 2017-11-18 DIAGNOSIS — Z7984 Long term (current) use of oral hypoglycemic drugs: Secondary | ICD-10-CM | POA: Insufficient documentation

## 2017-11-18 DIAGNOSIS — I480 Paroxysmal atrial fibrillation: Secondary | ICD-10-CM

## 2017-11-18 DIAGNOSIS — Z880 Allergy status to penicillin: Secondary | ICD-10-CM | POA: Insufficient documentation

## 2017-11-18 DIAGNOSIS — G8929 Other chronic pain: Secondary | ICD-10-CM | POA: Insufficient documentation

## 2017-11-18 DIAGNOSIS — I5042 Chronic combined systolic (congestive) and diastolic (congestive) heart failure: Secondary | ICD-10-CM | POA: Insufficient documentation

## 2017-11-18 DIAGNOSIS — I2721 Secondary pulmonary arterial hypertension: Secondary | ICD-10-CM | POA: Diagnosis not present

## 2017-11-18 DIAGNOSIS — I110 Hypertensive heart disease with heart failure: Secondary | ICD-10-CM | POA: Insufficient documentation

## 2017-11-18 DIAGNOSIS — I890 Lymphedema, not elsewhere classified: Secondary | ICD-10-CM | POA: Diagnosis not present

## 2017-11-18 DIAGNOSIS — K2970 Gastritis, unspecified, without bleeding: Secondary | ICD-10-CM | POA: Insufficient documentation

## 2017-11-18 DIAGNOSIS — Z7901 Long term (current) use of anticoagulants: Secondary | ICD-10-CM | POA: Insufficient documentation

## 2017-11-18 DIAGNOSIS — Z79899 Other long term (current) drug therapy: Secondary | ICD-10-CM | POA: Insufficient documentation

## 2017-11-18 DIAGNOSIS — I272 Pulmonary hypertension, unspecified: Secondary | ICD-10-CM

## 2017-11-18 DIAGNOSIS — I481 Persistent atrial fibrillation: Secondary | ICD-10-CM | POA: Insufficient documentation

## 2017-11-18 DIAGNOSIS — N179 Acute kidney failure, unspecified: Secondary | ICD-10-CM | POA: Insufficient documentation

## 2017-11-18 DIAGNOSIS — G4733 Obstructive sleep apnea (adult) (pediatric): Secondary | ICD-10-CM | POA: Insufficient documentation

## 2017-11-18 DIAGNOSIS — I10 Essential (primary) hypertension: Secondary | ICD-10-CM

## 2017-11-18 DIAGNOSIS — Z794 Long term (current) use of insulin: Secondary | ICD-10-CM | POA: Insufficient documentation

## 2017-11-18 DIAGNOSIS — E079 Disorder of thyroid, unspecified: Secondary | ICD-10-CM | POA: Diagnosis not present

## 2017-11-18 DIAGNOSIS — I5022 Chronic systolic (congestive) heart failure: Secondary | ICD-10-CM

## 2017-11-18 LAB — BASIC METABOLIC PANEL
Anion gap: 12 (ref 5–15)
BUN: 29 mg/dL — ABNORMAL HIGH (ref 6–20)
CO2: 27 mmol/L (ref 22–32)
Calcium: 9.4 mg/dL (ref 8.9–10.3)
Chloride: 101 mmol/L (ref 101–111)
Creatinine, Ser: 1.11 mg/dL — ABNORMAL HIGH (ref 0.44–1.00)
GFR calc Af Amer: 55 mL/min — ABNORMAL LOW (ref >60)
GFR calc non Af Amer: 48 mL/min — ABNORMAL LOW (ref >60)
Glucose, Bld: 94 mg/dL (ref 65–99)
Potassium: 4.2 mmol/L (ref 3.5–5.1)
Sodium: 140 mmol/L (ref 135–145)

## 2017-11-18 NOTE — Patient Instructions (Addendum)
Continue weighing daily and call for an overnight weight gain of > 2 pounds or a weekly weight gain of >5 pounds.  Consider using [facility] for transportation to [place]

## 2017-11-19 ENCOUNTER — Other Ambulatory Visit: Payer: Self-pay | Admitting: Family Medicine

## 2017-11-19 ENCOUNTER — Telehealth: Payer: Self-pay | Admitting: Cardiovascular Disease

## 2017-11-19 DIAGNOSIS — J441 Chronic obstructive pulmonary disease with (acute) exacerbation: Secondary | ICD-10-CM | POA: Diagnosis not present

## 2017-11-19 DIAGNOSIS — I5043 Acute on chronic combined systolic (congestive) and diastolic (congestive) heart failure: Secondary | ICD-10-CM | POA: Diagnosis not present

## 2017-11-19 DIAGNOSIS — E1122 Type 2 diabetes mellitus with diabetic chronic kidney disease: Secondary | ICD-10-CM | POA: Diagnosis not present

## 2017-11-19 DIAGNOSIS — N183 Chronic kidney disease, stage 3 (moderate): Secondary | ICD-10-CM | POA: Diagnosis not present

## 2017-11-19 DIAGNOSIS — I272 Pulmonary hypertension, unspecified: Secondary | ICD-10-CM | POA: Diagnosis not present

## 2017-11-19 DIAGNOSIS — I130 Hypertensive heart and chronic kidney disease with heart failure and stage 1 through stage 4 chronic kidney disease, or unspecified chronic kidney disease: Secondary | ICD-10-CM | POA: Diagnosis not present

## 2017-11-19 DIAGNOSIS — J9622 Acute and chronic respiratory failure with hypercapnia: Secondary | ICD-10-CM | POA: Diagnosis not present

## 2017-11-19 DIAGNOSIS — J9621 Acute and chronic respiratory failure with hypoxia: Secondary | ICD-10-CM | POA: Diagnosis not present

## 2017-11-19 DIAGNOSIS — I481 Persistent atrial fibrillation: Secondary | ICD-10-CM | POA: Diagnosis not present

## 2017-11-19 NOTE — Telephone Encounter (Signed)
Ashleigh from [facility] calling States the patients weight is up to 214.4 pounds That's about 2 pounds a day and 6 pounds in less than a week Patient is taking Torsemide 20mg  daily - took an extra 20 mg today to equal 40 mg Please call to discuss at [phone]

## 2017-11-19 NOTE — Telephone Encounter (Signed)
No answer. Left message to call back.   

## 2017-11-20 DIAGNOSIS — N183 Chronic kidney disease, stage 3 (moderate): Secondary | ICD-10-CM | POA: Diagnosis not present

## 2017-11-20 DIAGNOSIS — I272 Pulmonary hypertension, unspecified: Secondary | ICD-10-CM | POA: Diagnosis not present

## 2017-11-20 DIAGNOSIS — J441 Chronic obstructive pulmonary disease with (acute) exacerbation: Secondary | ICD-10-CM | POA: Diagnosis not present

## 2017-11-20 DIAGNOSIS — E1122 Type 2 diabetes mellitus with diabetic chronic kidney disease: Secondary | ICD-10-CM | POA: Diagnosis not present

## 2017-11-20 DIAGNOSIS — J9621 Acute and chronic respiratory failure with hypoxia: Secondary | ICD-10-CM | POA: Diagnosis not present

## 2017-11-20 DIAGNOSIS — I5043 Acute on chronic combined systolic (congestive) and diastolic (congestive) heart failure: Secondary | ICD-10-CM | POA: Diagnosis not present

## 2017-11-20 DIAGNOSIS — I130 Hypertensive heart and chronic kidney disease with heart failure and stage 1 through stage 4 chronic kidney disease, or unspecified chronic kidney disease: Secondary | ICD-10-CM | POA: Diagnosis not present

## 2017-11-20 DIAGNOSIS — I481 Persistent atrial fibrillation: Secondary | ICD-10-CM | POA: Diagnosis not present

## 2017-11-20 DIAGNOSIS — J9622 Acute and chronic respiratory failure with hypercapnia: Secondary | ICD-10-CM | POA: Diagnosis not present

## 2017-11-23 DIAGNOSIS — I5043 Acute on chronic combined systolic (congestive) and diastolic (congestive) heart failure: Secondary | ICD-10-CM | POA: Diagnosis not present

## 2017-11-23 DIAGNOSIS — I130 Hypertensive heart and chronic kidney disease with heart failure and stage 1 through stage 4 chronic kidney disease, or unspecified chronic kidney disease: Secondary | ICD-10-CM | POA: Diagnosis not present

## 2017-11-23 DIAGNOSIS — I272 Pulmonary hypertension, unspecified: Secondary | ICD-10-CM | POA: Diagnosis not present

## 2017-11-23 DIAGNOSIS — J9621 Acute and chronic respiratory failure with hypoxia: Secondary | ICD-10-CM | POA: Diagnosis not present

## 2017-11-23 DIAGNOSIS — E1122 Type 2 diabetes mellitus with diabetic chronic kidney disease: Secondary | ICD-10-CM | POA: Diagnosis not present

## 2017-11-23 DIAGNOSIS — J441 Chronic obstructive pulmonary disease with (acute) exacerbation: Secondary | ICD-10-CM | POA: Diagnosis not present

## 2017-11-23 DIAGNOSIS — J9622 Acute and chronic respiratory failure with hypercapnia: Secondary | ICD-10-CM | POA: Diagnosis not present

## 2017-11-23 DIAGNOSIS — I481 Persistent atrial fibrillation: Secondary | ICD-10-CM | POA: Diagnosis not present

## 2017-11-23 DIAGNOSIS — N183 Chronic kidney disease, stage 3 (moderate): Secondary | ICD-10-CM | POA: Diagnosis not present

## 2017-11-24 ENCOUNTER — Other Ambulatory Visit: Payer: Self-pay | Admitting: Family Medicine

## 2017-11-24 DIAGNOSIS — N183 Chronic kidney disease, stage 3 (moderate): Secondary | ICD-10-CM | POA: Diagnosis not present

## 2017-11-24 DIAGNOSIS — I130 Hypertensive heart and chronic kidney disease with heart failure and stage 1 through stage 4 chronic kidney disease, or unspecified chronic kidney disease: Secondary | ICD-10-CM | POA: Diagnosis not present

## 2017-11-24 DIAGNOSIS — J9622 Acute and chronic respiratory failure with hypercapnia: Secondary | ICD-10-CM | POA: Diagnosis not present

## 2017-11-24 DIAGNOSIS — E1122 Type 2 diabetes mellitus with diabetic chronic kidney disease: Secondary | ICD-10-CM | POA: Diagnosis not present

## 2017-11-24 DIAGNOSIS — J9621 Acute and chronic respiratory failure with hypoxia: Secondary | ICD-10-CM | POA: Diagnosis not present

## 2017-11-24 DIAGNOSIS — I5043 Acute on chronic combined systolic (congestive) and diastolic (congestive) heart failure: Secondary | ICD-10-CM | POA: Diagnosis not present

## 2017-11-24 DIAGNOSIS — I272 Pulmonary hypertension, unspecified: Secondary | ICD-10-CM | POA: Diagnosis not present

## 2017-11-24 DIAGNOSIS — I481 Persistent atrial fibrillation: Secondary | ICD-10-CM | POA: Diagnosis not present

## 2017-11-24 DIAGNOSIS — J441 Chronic obstructive pulmonary disease with (acute) exacerbation: Secondary | ICD-10-CM | POA: Diagnosis not present

## 2017-11-24 NOTE — Telephone Encounter (Signed)
Copied from [place] [id number]. Topic: Quick Communication - Rx Refill/Question >> Nov 24, 2017  2:20 PM Synthia Innocent wrote: Medication: losartan (COZAAR) 25 MG tablet   Has the patient contacted their pharmacy? Yes.   (Agent: If no, request that the patient contact the pharmacy for the refill.) (Agent: If yes, when and what did the pharmacy advise?)  Preferred Pharmacy (with phone number or street name): [facility]  Agent: Please be advised that RX refills may take up to 3 business days. We ask that you follow-up with your pharmacy.

## 2017-11-25 DIAGNOSIS — J9611 Chronic respiratory failure with hypoxia: Secondary | ICD-10-CM | POA: Diagnosis not present

## 2017-11-25 MED ORDER — LOSARTAN POTASSIUM 25 MG PO TABS: 25.0000 mg | ORAL_TABLET | Freq: Every day | ORAL | 1 refills | Status: DC

## 2017-11-25 NOTE — Telephone Encounter (Signed)
See request below:   Refill request for Cozaar / Previously written by Dr. Apolonio Schneiders)  Disp Refills Start End   losartan (COZAAR) 25 MG tablet 30 tablet 0 08/29/2017    Sig - Route: Take 1 tablet (25 mg total) by mouth daily. - Oral   / LOV with Dr [name] on 06/15/17 / Patient does see cardiology.  Will forward to provider for review.

## 2017-11-25 NOTE — Telephone Encounter (Signed)
Per most recent cardiology note she was to continue this medication.  Refill sent to pharmacy.  Recent potassium is acceptable.

## 2017-11-25 NOTE — Telephone Encounter (Signed)
Last OV 06/15/17  last filled 08/29/17 30 0rf

## 2017-11-26 DIAGNOSIS — E1122 Type 2 diabetes mellitus with diabetic chronic kidney disease: Secondary | ICD-10-CM | POA: Diagnosis not present

## 2017-11-26 DIAGNOSIS — I481 Persistent atrial fibrillation: Secondary | ICD-10-CM | POA: Diagnosis not present

## 2017-11-26 DIAGNOSIS — N183 Chronic kidney disease, stage 3 (moderate): Secondary | ICD-10-CM | POA: Diagnosis not present

## 2017-11-26 DIAGNOSIS — J9622 Acute and chronic respiratory failure with hypercapnia: Secondary | ICD-10-CM | POA: Diagnosis not present

## 2017-11-26 DIAGNOSIS — J9621 Acute and chronic respiratory failure with hypoxia: Secondary | ICD-10-CM | POA: Diagnosis not present

## 2017-11-26 DIAGNOSIS — I5043 Acute on chronic combined systolic (congestive) and diastolic (congestive) heart failure: Secondary | ICD-10-CM | POA: Diagnosis not present

## 2017-11-26 DIAGNOSIS — I272 Pulmonary hypertension, unspecified: Secondary | ICD-10-CM | POA: Diagnosis not present

## 2017-11-26 DIAGNOSIS — J441 Chronic obstructive pulmonary disease with (acute) exacerbation: Secondary | ICD-10-CM | POA: Diagnosis not present

## 2017-11-26 DIAGNOSIS — I130 Hypertensive heart and chronic kidney disease with heart failure and stage 1 through stage 4 chronic kidney disease, or unspecified chronic kidney disease: Secondary | ICD-10-CM | POA: Diagnosis not present

## 2017-11-27 DIAGNOSIS — J441 Chronic obstructive pulmonary disease with (acute) exacerbation: Secondary | ICD-10-CM | POA: Diagnosis not present

## 2017-11-27 DIAGNOSIS — J9622 Acute and chronic respiratory failure with hypercapnia: Secondary | ICD-10-CM | POA: Diagnosis not present

## 2017-11-27 DIAGNOSIS — I272 Pulmonary hypertension, unspecified: Secondary | ICD-10-CM | POA: Diagnosis not present

## 2017-11-27 DIAGNOSIS — I481 Persistent atrial fibrillation: Secondary | ICD-10-CM | POA: Diagnosis not present

## 2017-11-27 DIAGNOSIS — I130 Hypertensive heart and chronic kidney disease with heart failure and stage 1 through stage 4 chronic kidney disease, or unspecified chronic kidney disease: Secondary | ICD-10-CM | POA: Diagnosis not present

## 2017-11-27 DIAGNOSIS — N183 Chronic kidney disease, stage 3 (moderate): Secondary | ICD-10-CM | POA: Diagnosis not present

## 2017-11-27 DIAGNOSIS — I5043 Acute on chronic combined systolic (congestive) and diastolic (congestive) heart failure: Secondary | ICD-10-CM | POA: Diagnosis not present

## 2017-11-27 DIAGNOSIS — J9621 Acute and chronic respiratory failure with hypoxia: Secondary | ICD-10-CM | POA: Diagnosis not present

## 2017-11-27 DIAGNOSIS — E1122 Type 2 diabetes mellitus with diabetic chronic kidney disease: Secondary | ICD-10-CM | POA: Diagnosis not present

## 2017-11-28 ENCOUNTER — Other Ambulatory Visit: Payer: Self-pay | Admitting: Family Medicine

## 2017-11-30 ENCOUNTER — Ambulatory Visit (INDEPENDENT_AMBULATORY_CARE_PROVIDER_SITE_OTHER): Payer: Medicare HMO | Admitting: *Deleted

## 2017-11-30 ENCOUNTER — Other Ambulatory Visit: Payer: Self-pay

## 2017-11-30 DIAGNOSIS — I495 Sick sinus syndrome: Secondary | ICD-10-CM | POA: Diagnosis not present

## 2017-11-30 DIAGNOSIS — E785 Hyperlipidemia, unspecified: Secondary | ICD-10-CM

## 2017-11-30 MED ORDER — ROSUVASTATIN CALCIUM 10 MG PO TABS: 10.0000 mg | ORAL_TABLET | Freq: Every day | ORAL | 3 refills | Status: DC

## 2017-11-30 NOTE — Progress Notes (Signed)
Remote pacemaker transmission.   

## 2017-12-01 ENCOUNTER — Ambulatory Visit: Payer: Self-pay | Admitting: *Deleted

## 2017-12-01 ENCOUNTER — Encounter: Payer: Self-pay | Admitting: Cardiology

## 2017-12-01 DIAGNOSIS — I5043 Acute on chronic combined systolic (congestive) and diastolic (congestive) heart failure: Secondary | ICD-10-CM | POA: Diagnosis not present

## 2017-12-01 DIAGNOSIS — I130 Hypertensive heart and chronic kidney disease with heart failure and stage 1 through stage 4 chronic kidney disease, or unspecified chronic kidney disease: Secondary | ICD-10-CM | POA: Diagnosis not present

## 2017-12-01 DIAGNOSIS — N183 Chronic kidney disease, stage 3 (moderate): Secondary | ICD-10-CM | POA: Diagnosis not present

## 2017-12-01 DIAGNOSIS — I272 Pulmonary hypertension, unspecified: Secondary | ICD-10-CM | POA: Diagnosis not present

## 2017-12-01 DIAGNOSIS — J9621 Acute and chronic respiratory failure with hypoxia: Secondary | ICD-10-CM | POA: Diagnosis not present

## 2017-12-01 DIAGNOSIS — J9622 Acute and chronic respiratory failure with hypercapnia: Secondary | ICD-10-CM | POA: Diagnosis not present

## 2017-12-01 DIAGNOSIS — E1122 Type 2 diabetes mellitus with diabetic chronic kidney disease: Secondary | ICD-10-CM | POA: Diagnosis not present

## 2017-12-01 DIAGNOSIS — J441 Chronic obstructive pulmonary disease with (acute) exacerbation: Secondary | ICD-10-CM | POA: Diagnosis not present

## 2017-12-01 DIAGNOSIS — I481 Persistent atrial fibrillation: Secondary | ICD-10-CM | POA: Diagnosis not present

## 2017-12-01 NOTE — Telephone Encounter (Signed)
The patient needs to speak with cardiology regarding this given they are the ones prescribing this though given that she has had weakness and fatigue as well as difficulty swallowing she really should be evaluated by a physician today to have it checked either at urgent care or in the emergency department.  Please advise them of this.  Thanks.

## 2017-12-01 NOTE — Telephone Encounter (Signed)
Patients potassium was checked on 11-18-17 by another provider. Please advise

## 2017-12-01 NOTE — Telephone Encounter (Signed)
Patient notified and will call cardiology regarding potassium. Patient states she will go to urgent care or er tomorrow because she does not have transportation today. Informed patient if symptoms worsen or she has any new symptoms then she will need to call EMS. Patient verbalized understanding.

## 2017-12-01 NOTE — Telephone Encounter (Signed)
Per chart it states it was last filled by [facility] cardiology on 11/12/17 but there is no quantity

## 2017-12-01 NOTE — Telephone Encounter (Signed)
Nurse states the patient has been out of potassium for 2 weeks.they have been calling for refill- but have just gotten a response.  She is concerned about her level because she is having symptoms- cramps in arms, weak/fatigue, dry weight 209 lb ( which is good), moody, difficultly with motility swallowing. They are going to restart the potassium at normal dosing today- dose she need more? They also want to know if they can draw labs to check level.   They are going to call the cardiologist office as well. They are the ones who prescribe the medication. Hollie Beach can be reached at [phone].      Reason for Disposition . Caller has URGENT medication question about med that PCP prescribed and triager unable to answer question  Protocols used: MEDICATION QUESTION CALL-A-AH

## 2017-12-02 DIAGNOSIS — J441 Chronic obstructive pulmonary disease with (acute) exacerbation: Secondary | ICD-10-CM | POA: Diagnosis not present

## 2017-12-02 DIAGNOSIS — I130 Hypertensive heart and chronic kidney disease with heart failure and stage 1 through stage 4 chronic kidney disease, or unspecified chronic kidney disease: Secondary | ICD-10-CM | POA: Diagnosis not present

## 2017-12-02 DIAGNOSIS — I5043 Acute on chronic combined systolic (congestive) and diastolic (congestive) heart failure: Secondary | ICD-10-CM | POA: Diagnosis not present

## 2017-12-02 DIAGNOSIS — J9622 Acute and chronic respiratory failure with hypercapnia: Secondary | ICD-10-CM | POA: Diagnosis not present

## 2017-12-02 DIAGNOSIS — E1122 Type 2 diabetes mellitus with diabetic chronic kidney disease: Secondary | ICD-10-CM | POA: Diagnosis not present

## 2017-12-02 DIAGNOSIS — J9621 Acute and chronic respiratory failure with hypoxia: Secondary | ICD-10-CM | POA: Diagnosis not present

## 2017-12-02 DIAGNOSIS — N183 Chronic kidney disease, stage 3 (moderate): Secondary | ICD-10-CM | POA: Diagnosis not present

## 2017-12-02 DIAGNOSIS — I272 Pulmonary hypertension, unspecified: Secondary | ICD-10-CM | POA: Diagnosis not present

## 2017-12-02 DIAGNOSIS — I481 Persistent atrial fibrillation: Secondary | ICD-10-CM | POA: Diagnosis not present

## 2017-12-03 ENCOUNTER — Other Ambulatory Visit: Payer: Self-pay | Admitting: Family Medicine

## 2017-12-03 DIAGNOSIS — I272 Pulmonary hypertension, unspecified: Secondary | ICD-10-CM | POA: Diagnosis not present

## 2017-12-03 DIAGNOSIS — E1122 Type 2 diabetes mellitus with diabetic chronic kidney disease: Secondary | ICD-10-CM | POA: Diagnosis not present

## 2017-12-03 DIAGNOSIS — J9621 Acute and chronic respiratory failure with hypoxia: Secondary | ICD-10-CM | POA: Diagnosis not present

## 2017-12-03 DIAGNOSIS — J441 Chronic obstructive pulmonary disease with (acute) exacerbation: Secondary | ICD-10-CM | POA: Diagnosis not present

## 2017-12-03 DIAGNOSIS — N183 Chronic kidney disease, stage 3 (moderate): Secondary | ICD-10-CM | POA: Diagnosis not present

## 2017-12-03 DIAGNOSIS — I481 Persistent atrial fibrillation: Secondary | ICD-10-CM | POA: Diagnosis not present

## 2017-12-03 DIAGNOSIS — J9622 Acute and chronic respiratory failure with hypercapnia: Secondary | ICD-10-CM | POA: Diagnosis not present

## 2017-12-03 DIAGNOSIS — I5043 Acute on chronic combined systolic (congestive) and diastolic (congestive) heart failure: Secondary | ICD-10-CM | POA: Diagnosis not present

## 2017-12-03 DIAGNOSIS — I130 Hypertensive heart and chronic kidney disease with heart failure and stage 1 through stage 4 chronic kidney disease, or unspecified chronic kidney disease: Secondary | ICD-10-CM | POA: Diagnosis not present

## 2017-12-04 ENCOUNTER — Other Ambulatory Visit
Admission: RE | Admit: 2017-12-04 | Discharge: 2017-12-04 | Disposition: A | Discharge status: Home / Self Care | Payer: Medicare HMO | Source: Ambulatory Visit | Attending: Cardiovascular Disease | Admitting: Cardiovascular Disease

## 2017-12-04 ENCOUNTER — Telehealth: Payer: Self-pay | Admitting: Cardiovascular Disease

## 2017-12-04 DIAGNOSIS — Z79899 Other long term (current) drug therapy: Secondary | ICD-10-CM

## 2017-12-04 DIAGNOSIS — I10 Essential (primary) hypertension: Secondary | ICD-10-CM

## 2017-12-04 DIAGNOSIS — I5022 Chronic systolic (congestive) heart failure: Secondary | ICD-10-CM

## 2017-12-04 LAB — BASIC METABOLIC PANEL
Anion gap: 11 (ref 5–15)
BUN: 29 mg/dL — ABNORMAL HIGH (ref 6–20)
CO2: 33 mmol/L — ABNORMAL HIGH (ref 22–32)
Calcium: 9.1 mg/dL (ref 8.9–10.3)
Chloride: 96 mmol/L — ABNORMAL LOW (ref 101–111)
Creatinine, Ser: 1.21 mg/dL — ABNORMAL HIGH (ref 0.44–1.00)
GFR calc Af Amer: 50 mL/min — ABNORMAL LOW (ref >60)
GFR calc non Af Amer: 43 mL/min — ABNORMAL LOW (ref >60)
Glucose, Bld: 135 mg/dL — ABNORMAL HIGH (ref 65–99)
Potassium: 2.9 mmol/L — ABNORMAL LOW (ref 3.5–5.1)
Sodium: 140 mmol/L (ref 135–145)

## 2017-12-04 MED ORDER — POTASSIUM CHLORIDE CRYS ER 20 MEQ PO TBCR: 20.0000 meq | EXTENDED_RELEASE_TABLET | Freq: Two times a day (BID) | ORAL | 3 refills | Status: DC

## 2017-12-04 NOTE — Telephone Encounter (Signed)
[facility] calling back

## 2017-12-04 NOTE — Telephone Encounter (Signed)
New telephone encounter started today (12/04/17). Closing this encounter.

## 2017-12-04 NOTE — Telephone Encounter (Signed)
S/w patient. She states she's been out of potassium for more than 2 weeks.  Denies shortness of breath, chest pain, palpitations, dizziness or swelling. She has had arm cramping, moodiness, fatigue and weakness. Her current weight is 209 lb which is around goal. Most recent BP 138/55. Discussed with Dr Fletcher Anon. He advised patient to have a BMET to check on potassium.  She verbalized [facility] However, patient cannot drive and relies on her family to take her places. With patient's permission, I called her daughter, Debra Saunders, and left a detailed message with the recommendations to go to lab today (ideally) or first thing tomorrow for the labs.  Advised patient and daughter (via her voicemail) to call the physician on call with our office or go to ED if she becomes extremely weak or has new or worsening symptoms. Patient states she has been eating bananas and prunes.

## 2017-12-04 NOTE — Telephone Encounter (Signed)
No answer with home health nurse. Left message that I spoke with patient and that she needed lab work to call back if any further questions.

## 2017-12-04 NOTE — Telephone Encounter (Signed)
[facility] calling pt is out of her Potassium for about 2 weeks PCP told them to call us so we may be able to refill it   *STAT* If patient is at the pharmacy, call can be transferred to refill team.   1. Which medications need to be refilled? (please list name of each medication and dose if known) Potassium Chloride   2. Which pharmacy/location (including street and city if local pharmacy) is medication to be sent to? [facility] on [address]   3. Do they need a 30 day or 90 day supply? [place]

## 2017-12-04 NOTE — Telephone Encounter (Signed)
I called and spoke with Debra Saunders this afternoon.  Rx for KCl called in earlier today.  She is aware to take 80meq BID.  She already took torsemide today but will not take tomorrow and then resume on Sunday.

## 2017-12-04 NOTE — Telephone Encounter (Signed)
No answer. Left message with [facility] health nurse.  No answer with patient. Left message with patient that refill was sent in and to please call to discuss her symptoms. There is a telephone message in [place] from PCP as well.

## 2017-12-08 DIAGNOSIS — I130 Hypertensive heart and chronic kidney disease with heart failure and stage 1 through stage 4 chronic kidney disease, or unspecified chronic kidney disease: Secondary | ICD-10-CM | POA: Diagnosis not present

## 2017-12-08 DIAGNOSIS — I481 Persistent atrial fibrillation: Secondary | ICD-10-CM | POA: Diagnosis not present

## 2017-12-08 DIAGNOSIS — I5043 Acute on chronic combined systolic (congestive) and diastolic (congestive) heart failure: Secondary | ICD-10-CM | POA: Diagnosis not present

## 2017-12-08 DIAGNOSIS — J441 Chronic obstructive pulmonary disease with (acute) exacerbation: Secondary | ICD-10-CM | POA: Diagnosis not present

## 2017-12-08 DIAGNOSIS — J9622 Acute and chronic respiratory failure with hypercapnia: Secondary | ICD-10-CM | POA: Diagnosis not present

## 2017-12-08 DIAGNOSIS — E1122 Type 2 diabetes mellitus with diabetic chronic kidney disease: Secondary | ICD-10-CM | POA: Diagnosis not present

## 2017-12-08 DIAGNOSIS — I272 Pulmonary hypertension, unspecified: Secondary | ICD-10-CM | POA: Diagnosis not present

## 2017-12-08 DIAGNOSIS — N183 Chronic kidney disease, stage 3 (moderate): Secondary | ICD-10-CM | POA: Diagnosis not present

## 2017-12-08 DIAGNOSIS — J9621 Acute and chronic respiratory failure with hypoxia: Secondary | ICD-10-CM | POA: Diagnosis not present

## 2017-12-09 DIAGNOSIS — J9621 Acute and chronic respiratory failure with hypoxia: Secondary | ICD-10-CM | POA: Diagnosis not present

## 2017-12-09 DIAGNOSIS — J9622 Acute and chronic respiratory failure with hypercapnia: Secondary | ICD-10-CM | POA: Diagnosis not present

## 2017-12-09 DIAGNOSIS — I130 Hypertensive heart and chronic kidney disease with heart failure and stage 1 through stage 4 chronic kidney disease, or unspecified chronic kidney disease: Secondary | ICD-10-CM | POA: Diagnosis not present

## 2017-12-09 DIAGNOSIS — I481 Persistent atrial fibrillation: Secondary | ICD-10-CM | POA: Diagnosis not present

## 2017-12-09 DIAGNOSIS — J441 Chronic obstructive pulmonary disease with (acute) exacerbation: Secondary | ICD-10-CM | POA: Diagnosis not present

## 2017-12-09 DIAGNOSIS — I272 Pulmonary hypertension, unspecified: Secondary | ICD-10-CM | POA: Diagnosis not present

## 2017-12-09 DIAGNOSIS — E1122 Type 2 diabetes mellitus with diabetic chronic kidney disease: Secondary | ICD-10-CM | POA: Diagnosis not present

## 2017-12-09 DIAGNOSIS — I5043 Acute on chronic combined systolic (congestive) and diastolic (congestive) heart failure: Secondary | ICD-10-CM | POA: Diagnosis not present

## 2017-12-09 DIAGNOSIS — N183 Chronic kidney disease, stage 3 (moderate): Secondary | ICD-10-CM | POA: Diagnosis not present

## 2017-12-10 ENCOUNTER — Ambulatory Visit (INDEPENDENT_AMBULATORY_CARE_PROVIDER_SITE_OTHER): Payer: Medicare HMO | Admitting: Family Medicine

## 2017-12-10 ENCOUNTER — Encounter: Payer: Self-pay | Admitting: Family Medicine

## 2017-12-10 VITALS — BP 148/80 | HR 60 | Temp 98.6°F | Ht 60.0 in | Wt 212.4 lb

## 2017-12-10 DIAGNOSIS — E039 Hypothyroidism, unspecified: Secondary | ICD-10-CM | POA: Diagnosis not present

## 2017-12-10 DIAGNOSIS — I82502 Chronic embolism and thrombosis of unspecified deep veins of left lower extremity: Secondary | ICD-10-CM

## 2017-12-10 DIAGNOSIS — R0789 Other chest pain: Secondary | ICD-10-CM | POA: Diagnosis not present

## 2017-12-10 DIAGNOSIS — I5022 Chronic systolic (congestive) heart failure: Secondary | ICD-10-CM | POA: Diagnosis not present

## 2017-12-10 DIAGNOSIS — Z79899 Other long term (current) drug therapy: Secondary | ICD-10-CM | POA: Diagnosis not present

## 2017-12-10 DIAGNOSIS — N179 Acute kidney failure, unspecified: Secondary | ICD-10-CM | POA: Diagnosis not present

## 2017-12-10 DIAGNOSIS — J441 Chronic obstructive pulmonary disease with (acute) exacerbation: Secondary | ICD-10-CM | POA: Diagnosis not present

## 2017-12-10 DIAGNOSIS — D649 Anemia, unspecified: Secondary | ICD-10-CM

## 2017-12-10 DIAGNOSIS — I480 Paroxysmal atrial fibrillation: Secondary | ICD-10-CM

## 2017-12-10 DIAGNOSIS — J9621 Acute and chronic respiratory failure with hypoxia: Secondary | ICD-10-CM | POA: Diagnosis not present

## 2017-12-10 DIAGNOSIS — N183 Chronic kidney disease, stage 3 (moderate): Secondary | ICD-10-CM | POA: Diagnosis not present

## 2017-12-10 DIAGNOSIS — I481 Persistent atrial fibrillation: Secondary | ICD-10-CM | POA: Diagnosis not present

## 2017-12-10 DIAGNOSIS — J9622 Acute and chronic respiratory failure with hypercapnia: Secondary | ICD-10-CM | POA: Diagnosis not present

## 2017-12-10 DIAGNOSIS — I272 Pulmonary hypertension, unspecified: Secondary | ICD-10-CM | POA: Diagnosis not present

## 2017-12-10 DIAGNOSIS — I10 Essential (primary) hypertension: Secondary | ICD-10-CM | POA: Diagnosis not present

## 2017-12-10 DIAGNOSIS — E1122 Type 2 diabetes mellitus with diabetic chronic kidney disease: Secondary | ICD-10-CM | POA: Diagnosis not present

## 2017-12-10 DIAGNOSIS — Z5181 Encounter for therapeutic drug level monitoring: Secondary | ICD-10-CM | POA: Diagnosis not present

## 2017-12-10 DIAGNOSIS — I130 Hypertensive heart and chronic kidney disease with heart failure and stage 1 through stage 4 chronic kidney disease, or unspecified chronic kidney disease: Secondary | ICD-10-CM | POA: Diagnosis not present

## 2017-12-10 DIAGNOSIS — I5043 Acute on chronic combined systolic (congestive) and diastolic (congestive) heart failure: Secondary | ICD-10-CM | POA: Diagnosis not present

## 2017-12-10 LAB — COMPREHENSIVE METABOLIC PANEL
ALT: 11 U/L (ref 0–35)
AST: 14 U/L (ref 0–37)
Albumin: 3.9 g/dL (ref 3.5–5.2)
Alkaline Phosphatase: 100 U/L (ref 39–117)
BUN: 26 mg/dL — ABNORMAL HIGH (ref 6–23)
CO2: 31 mEq/L (ref 19–32)
Calcium: 9.5 mg/dL (ref 8.4–10.5)
Chloride: 100 mEq/L (ref 96–112)
Creatinine, Ser: 1.16 mg/dL (ref 0.40–1.20)
GFR: 58.66 mL/min — ABNORMAL LOW (ref >60.00)
Glucose, Bld: 115 mg/dL — ABNORMAL HIGH (ref 70–99)
Potassium: 4.3 mEq/L (ref 3.5–5.1)
Sodium: 140 mEq/L (ref 135–145)
Total Bilirubin: 0.3 mg/dL (ref 0.2–1.2)
Total Protein: 7 g/dL (ref 6.0–8.3)

## 2017-12-10 LAB — CBC
HCT: 32.4 % — ABNORMAL LOW (ref 36.0–46.0)
Hemoglobin: 10.4 g/dL — ABNORMAL LOW (ref 12.0–15.0)
MCHC: 32.2 g/dL (ref 30.0–36.0)
MCV: 85.2 fl (ref 78.0–100.0)
Platelets: 235 10*3/uL (ref 150.0–400.0)
RBC: 3.81 Mil/uL — ABNORMAL LOW (ref 3.87–5.11)
RDW: 13.8 % (ref 11.5–15.5)
WBC: 5.6 10*3/uL (ref 4.0–10.5)

## 2017-12-10 LAB — TSH: TSH: 1.04 u[IU]/mL (ref 0.35–4.50)

## 2017-12-10 MED ORDER — AMIODARONE HCL 200 MG PO TABS: 200.0000 mg | ORAL_TABLET | Freq: Every day | ORAL | 0 refills | Status: DC

## 2017-12-10 NOTE — Patient Instructions (Signed)
Nice to see you. Please monitor your breathing and weight.  Please follow the directions given to you by cardiology. We will check lab work today and contact you with the results. Please continue to work on getting set up with the advanced heart failure clinic. If you develop chest pressure, shortness of breath, increased swelling, weight gain of 2 to 3 pounds in 1 day or 5 pounds in 1 week, or any new or changing symptoms please seek medical attention.

## 2017-12-10 NOTE — Assessment & Plan Note (Signed)
TSH needs to be checked.

## 2017-12-10 NOTE — Assessment & Plan Note (Signed)
Reports home BPs are typically 130 over 60s.  She will monitor.  She will continue her current regimen.

## 2017-12-10 NOTE — Assessment & Plan Note (Signed)
Severe.  I reinforced that she needs to establish with the advanced heart failure clinic in [place].

## 2017-12-10 NOTE — Assessment & Plan Note (Signed)
Seems to be much improved from previously.  Her daughter endorses that the patient appears to have more energy as well.  She will continue with her torsemide as directed by cardiology.  I discussed return precautions with her.  We will check kidney function and potassium today.

## 2017-12-10 NOTE — Assessment & Plan Note (Signed)
Likely anemia of chronic disease.  Recheck CBC.

## 2017-12-10 NOTE — Assessment & Plan Note (Signed)
Currently in sinus rhythm.  Status post cardioversion during hospitalization.  She is now on Eliquis as well as on amiodarone.  I discussed that in the future amiodarone refills need to come from her cardiologist.  They voiced understanding.  I gave her a 30-day supply.  Check TSH given that she is now on amiodarone.  Given return precautions.

## 2017-12-10 NOTE — Progress Notes (Signed)
[name], MD Phone: [phone]  Debra Saunders is a 72 y.o. female who presents today for hospital follow-up.  CC: CHF, afib, hypokalemia, history of DVT, HTN  Patient was hospitalized from 09/30/2017-10/22/2017 for heart failure exacerbation.  She was in A. fib.  Patient was admitted and placed on diuretic therapy.  She progressively improved.  She did undergo cardiac catheterization after troponin spiked though catheterization revealed no significant CAD.  She had right heart catheterization which showed mildly elevated filling pressures, severe pulmonary hypertension and severely reduced cardiac output.  She underwent cardioversion and normal sinus rhythm was restored.  She was to remain on amiodarone 200 mg once daily.  Also on Eliquis for anticoagulation.  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  She continues on sildenafil as well for severe pulmonary hypertension.  She was discharged to rehab.  She reports her breathing is pretty stable at this time.  Her swelling has not worsened.  She has chronic swelling in her left leg related to prior DVT.  She notes she sleeps on one pillow.  No PND.  She has had no chest pressure.  She reports right sided chest soreness that is consistent with her prior history of costochondritis.  No exertional component.  She has had no palpitations.  She has had no bleeding.  She did have some muscle cramps and her potassium was found to be low.  This has not been rechecked since cardiology placed her on potassium again and had her hold her torsemide for a day.  She remained stable on 4 L of oxygen by nasal cannula.  She has not been able to get set up with advanced heart failure clinic due to transportation issues.  She does have resources that she is working with to get transportation set up.  Social History   Tobacco Use  Smoking Status Former Smoker  . Packs/day: 0.30  . Years:  2.00  . Pack years: 0.60  . Types: Cigarettes  . Last attempt to quit: 06/23/1990  . Years since quitting: 27.4  Smokeless Tobacco Never Used     ROS see history of present illness  Objective  Physical Exam Vitals:   12/10/17 1125  BP: (!) 148/80  Pulse: 60  Temp: 98.6 F (37 C)  SpO2: 93%    BP Readings from Last 3 Encounters:  12/10/17 (!) 148/80  11/18/17 131/83  11/12/17 120/70   Wt Readings from Last 3 Encounters:  12/10/17 212 lb 6.4 oz (96.3 kg)  11/18/17 212 lb (96.2 kg)  11/12/17 210 lb (95.3 kg)    Physical Exam  Constitutional: No distress.  Cardiovascular: Normal rate, regular rhythm and normal heart sounds.  Pulmonary/Chest: Effort normal and breath sounds normal. She exhibits tenderness (Bilateral costochondral joint tenderness with no apparent overlying skin changes or palpable defects).  Musculoskeletal:  Left leg is chronically greater in size in the right leg, no pitting edema noted  Neurological: She is alert.  Skin: Skin is warm and dry. She is not diaphoretic.     Assessment/Plan: Please see individual problem list.  Hypertension Reports home BPs are typically 130 over 60s.  She will monitor.  She will continue her current regimen.  Chronic systolic heart failure (HCC) Seems to be much improved from previously.  Her daughter endorses that the patient appears to have more energy as well.  She will continue with her torsemide as directed by cardiology.  I discussed return precautions with her.  We will check kidney function and potassium today.  Atrial fibrillation (HCC) Currently in sinus rhythm.  Status post cardioversion during hospitalization.  She is now on Eliquis as well as on amiodarone.  I discussed that in the future amiodarone refills need to come from her cardiologist.  They voiced understanding.  I gave her a 30-day supply.  Check TSH given that she is now on amiodarone.  Given return precautions.  Pulmonary hypertension Severe.   I reinforced that she needs to establish with the advanced heart failure clinic in [place].  Chronic deep vein thrombosis (DVT) (HCC) IVC filter in place.  Currently on Eliquis.  This will be managed through cardiology.  Hypothyroidism TSH needs to be checked.  Anemia Likely anemia of chronic disease.  Recheck CBC.  Musculoskeletal chest pain Patient with chronic chest wall discomfort.  Tenderness on exam.  She will continue to monitor.  Given return precautions.  Orders Placed This Encounter  Procedures  . Comp Met (CMET)  . CBC  . TSH    Meds ordered this encounter  Medications  . amiodarone (PACERONE) 200 MG tablet    Sig: Take 1 tablet (200 mg total) by mouth daily.    Dispense:  30 tablet    Refill:  0     [name], MD [place]

## 2017-12-10 NOTE — Assessment & Plan Note (Signed)
Patient with chronic chest wall discomfort.  Tenderness on exam.  She will continue to monitor.  Given return precautions.

## 2017-12-10 NOTE — Assessment & Plan Note (Signed)
IVC filter in place.  Currently on Eliquis.  This will be managed through cardiology.

## 2017-12-14 DIAGNOSIS — I509 Heart failure, unspecified: Secondary | ICD-10-CM | POA: Diagnosis not present

## 2017-12-15 DIAGNOSIS — I272 Pulmonary hypertension, unspecified: Secondary | ICD-10-CM | POA: Diagnosis not present

## 2017-12-15 DIAGNOSIS — G4730 Sleep apnea, unspecified: Secondary | ICD-10-CM | POA: Diagnosis not present

## 2017-12-15 DIAGNOSIS — R0609 Other forms of dyspnea: Secondary | ICD-10-CM | POA: Diagnosis not present

## 2017-12-16 DIAGNOSIS — N183 Chronic kidney disease, stage 3 (moderate): Secondary | ICD-10-CM | POA: Diagnosis not present

## 2017-12-16 DIAGNOSIS — I130 Hypertensive heart and chronic kidney disease with heart failure and stage 1 through stage 4 chronic kidney disease, or unspecified chronic kidney disease: Secondary | ICD-10-CM | POA: Diagnosis not present

## 2017-12-16 DIAGNOSIS — I481 Persistent atrial fibrillation: Secondary | ICD-10-CM | POA: Diagnosis not present

## 2017-12-16 DIAGNOSIS — J9622 Acute and chronic respiratory failure with hypercapnia: Secondary | ICD-10-CM | POA: Diagnosis not present

## 2017-12-16 DIAGNOSIS — I272 Pulmonary hypertension, unspecified: Secondary | ICD-10-CM | POA: Diagnosis not present

## 2017-12-16 DIAGNOSIS — J9621 Acute and chronic respiratory failure with hypoxia: Secondary | ICD-10-CM | POA: Diagnosis not present

## 2017-12-16 DIAGNOSIS — E1122 Type 2 diabetes mellitus with diabetic chronic kidney disease: Secondary | ICD-10-CM | POA: Diagnosis not present

## 2017-12-16 DIAGNOSIS — I5043 Acute on chronic combined systolic (congestive) and diastolic (congestive) heart failure: Secondary | ICD-10-CM | POA: Diagnosis not present

## 2017-12-16 DIAGNOSIS — J441 Chronic obstructive pulmonary disease with (acute) exacerbation: Secondary | ICD-10-CM | POA: Diagnosis not present

## 2017-12-17 DIAGNOSIS — N183 Chronic kidney disease, stage 3 (moderate): Secondary | ICD-10-CM | POA: Diagnosis not present

## 2017-12-17 DIAGNOSIS — J9621 Acute and chronic respiratory failure with hypoxia: Secondary | ICD-10-CM | POA: Diagnosis not present

## 2017-12-17 DIAGNOSIS — I5043 Acute on chronic combined systolic (congestive) and diastolic (congestive) heart failure: Secondary | ICD-10-CM | POA: Diagnosis not present

## 2017-12-17 DIAGNOSIS — I130 Hypertensive heart and chronic kidney disease with heart failure and stage 1 through stage 4 chronic kidney disease, or unspecified chronic kidney disease: Secondary | ICD-10-CM | POA: Diagnosis not present

## 2017-12-17 DIAGNOSIS — I481 Persistent atrial fibrillation: Secondary | ICD-10-CM | POA: Diagnosis not present

## 2017-12-17 DIAGNOSIS — J9622 Acute and chronic respiratory failure with hypercapnia: Secondary | ICD-10-CM | POA: Diagnosis not present

## 2017-12-17 DIAGNOSIS — J441 Chronic obstructive pulmonary disease with (acute) exacerbation: Secondary | ICD-10-CM | POA: Diagnosis not present

## 2017-12-17 DIAGNOSIS — I272 Pulmonary hypertension, unspecified: Secondary | ICD-10-CM | POA: Diagnosis not present

## 2017-12-17 DIAGNOSIS — E1122 Type 2 diabetes mellitus with diabetic chronic kidney disease: Secondary | ICD-10-CM | POA: Diagnosis not present

## 2017-12-21 ENCOUNTER — Other Ambulatory Visit: Payer: Self-pay | Admitting: Pharmacist

## 2017-12-21 NOTE — Patient Outreach (Signed)
[facility]) Care Management  12/21/2017  [name] 1945/11/11 [id number]   Incoming call from [name] in response to the [facility] Medication Adherence Campaign. Speak with patient. HIPAA identifiers verified and verbal consent received.  [name] reports that she takes her losartan once daily as directed. Denies any missed doses or barriers to adherence with this medication. Counsel patient on the importance of medication adherence. Reports that she uses a weekly pillbox to organize her medications, which she fills herself. Reports that she previously had a [place] who helped her to practice filling the pillbox and learning to do it herself. Ms. An reports that her daughter also helps her with her medications, picking them up from the pharmacy for her.  Ms. Falkner denies any further medication questions/concerns at this time. Provide patient with my phone number.  Will close pharmacy episode.  Harlow Asa, PharmD, [place] [facility] [phone]

## 2017-12-22 ENCOUNTER — Other Ambulatory Visit: Payer: Self-pay | Admitting: Family Medicine

## 2017-12-22 DIAGNOSIS — J9621 Acute and chronic respiratory failure with hypoxia: Secondary | ICD-10-CM | POA: Diagnosis not present

## 2017-12-22 DIAGNOSIS — J9622 Acute and chronic respiratory failure with hypercapnia: Secondary | ICD-10-CM | POA: Diagnosis not present

## 2017-12-22 DIAGNOSIS — I5043 Acute on chronic combined systolic (congestive) and diastolic (congestive) heart failure: Secondary | ICD-10-CM | POA: Diagnosis not present

## 2017-12-22 DIAGNOSIS — N183 Chronic kidney disease, stage 3 (moderate): Secondary | ICD-10-CM | POA: Diagnosis not present

## 2017-12-22 DIAGNOSIS — E118 Type 2 diabetes mellitus with unspecified complications: Secondary | ICD-10-CM

## 2017-12-22 DIAGNOSIS — I272 Pulmonary hypertension, unspecified: Secondary | ICD-10-CM | POA: Diagnosis not present

## 2017-12-22 DIAGNOSIS — E1122 Type 2 diabetes mellitus with diabetic chronic kidney disease: Secondary | ICD-10-CM | POA: Diagnosis not present

## 2017-12-22 DIAGNOSIS — I481 Persistent atrial fibrillation: Secondary | ICD-10-CM | POA: Diagnosis not present

## 2017-12-22 DIAGNOSIS — I130 Hypertensive heart and chronic kidney disease with heart failure and stage 1 through stage 4 chronic kidney disease, or unspecified chronic kidney disease: Secondary | ICD-10-CM | POA: Diagnosis not present

## 2017-12-22 DIAGNOSIS — J441 Chronic obstructive pulmonary disease with (acute) exacerbation: Secondary | ICD-10-CM | POA: Diagnosis not present

## 2017-12-22 DIAGNOSIS — Z794 Long term (current) use of insulin: Secondary | ICD-10-CM

## 2017-12-25 DIAGNOSIS — J9611 Chronic respiratory failure with hypoxia: Secondary | ICD-10-CM | POA: Diagnosis not present

## 2017-12-25 LAB — CUP PACEART REMOTE DEVICE CHECK
Battery Remaining Longevity: 114 mo
Battery Remaining Percentage: 100 %
Brady Statistic RA Percent Paced: 31 %
Brady Statistic RV Percent Paced: 3 %
Date Time Interrogation Session: 20190609072100
Implantable Lead Implant Date: 20150720
Implantable Lead Implant Date: 20150720
Implantable Lead Location: 753859
Implantable Lead Location: 753860
Implantable Lead Model: 5076
Implantable Lead Model: 5076
Implantable Pulse Generator Implant Date: 20150720
Lead Channel Impedance Value: 373 Ohm
Lead Channel Impedance Value: 505 Ohm
Lead Channel Pacing Threshold Amplitude: 0.9 V
Lead Channel Pacing Threshold Pulse Width: 0.4 ms
Lead Channel Setting Pacing Amplitude: 2 V
Lead Channel Setting Pacing Amplitude: 2.4 V
Lead Channel Setting Pacing Pulse Width: 0.4 ms
Lead Channel Setting Sensing Sensitivity: 2.5 mV
Pulse Gen Serial Number: 390330

## 2017-12-28 ENCOUNTER — Telehealth: Payer: Self-pay | Admitting: Physician Assistant

## 2017-12-28 ENCOUNTER — Other Ambulatory Visit: Payer: Self-pay

## 2017-12-28 NOTE — Patient Outreach (Signed)
[facility]) Care Management  12/28/2017  Debra Saunders Oct 26, 1945 [id number]   Telephone Screen  Referral Date: 12/25/17 Referral Source: [facility] Referral Reason: "DM, CKD,acute/chronic resp failure, history of noncompliance and frequent hospitalizations" Insurance: [employer]   Outreach attempt # 1 to patient. No answer at present. RN CM left HIPAA compliant voicemail message along with contact info.      Plan: RN CM will make outreach attempt to patient within three business days. RN CM will send unsuccessful outreach letter to patient.   Enzo Montgomery, RN,BSN,CCM [place] Management Telephonic Care Management Coordinator Direct Phone: [phone] Toll Free: [phone] Fax: [phone]

## 2017-12-28 NOTE — Telephone Encounter (Signed)
Pt  Rescheduled 6 wk fu appt tomorrow 79/19 with DUNN  to 02/02/18.with DUNN.

## 2017-12-29 ENCOUNTER — Ambulatory Visit: Payer: Medicare HMO | Admitting: Physician Assistant

## 2017-12-29 ENCOUNTER — Other Ambulatory Visit: Payer: Self-pay

## 2017-12-29 NOTE — Patient Outreach (Signed)
[facility]) Care Management  12/29/2017  TAYLORANNE LEKAS [birth year] [id number]    Telephone Screen  Referral Date: 12/25/17 Referral Source: [facility] Referral Reason: "DM, CKD,acute/chronic resp failure, history of noncompliance and frequent hospitalizations" Insurance: [employer]    Outreach attempt #2 to patient. No answer at present.      Plan: RN CM will make outreach attempt to patient within 3-4 business days.   Enzo Montgomery, RN,BSN,CCM [place] [facility] Telephonic Care Management Coordinator Direct Phone: [phone] Toll Free: [phone] Fax: [phone]

## 2017-12-30 ENCOUNTER — Ambulatory Visit: Payer: Self-pay

## 2017-12-31 ENCOUNTER — Other Ambulatory Visit: Payer: Self-pay

## 2017-12-31 NOTE — Patient Outreach (Signed)
[place] [facility]) Care Management  12/31/2017  Debra Saunders 1946-05-11 [id number]    Telephone Screen  Referral Date:12/25/17 Referral Source:[facility] Referral Reason:"DM, CKD,acute/chronic resp failure, history of noncompliance and frequent hospitalizations" Insurance:[employer]    Outreach attempt #3 to patient. Spoke with patient and screening completed.   Social: Patient resides in her home. She voices that she is fairly independent with ADLs/IADLs. She states that she does not drive. She relies on her dtr to take her to appts. However, patient reports that dtr works and she has to cancel/miss a lot of appts. As well as patient reports that her dtr only drives in [place] and will not drive to another town/city. Patient denies any recent falls.Patient states she was discharged from [place] on last week.    Conditions: per chart review, patient has PMH of chronic "chest wall discomfort", DVT, hypothyroidism, anemia, severe pulmonary HTN, A-fib, IVC filter placement and DM. Patient voices that she is monitoring cbgs 2x/day at home. She voices that cbgs range in the 100s -115s. Last A!C was 5.8(09/30/17). Patient voices that her main issue right now is managing her HF. She reports that her weights have "not been good.' she states that she has gained six pounds this week. She voices her baseline weight is 110 lbs and that every day this week she has gained a pound or more. She reports that weight today was 116 lbs. She voices that she is adhering to diuretic regimen and taking diuretic daily as well as doubling up when her wgt increases. Patient states she has taken an extra diuretic on Monday and yesterday but no changes in wgt. She admits to chronic swelling (left >right). She states that it had been several weeks since she had to take a breathing treatment. However, this week she had to start back taking them as she is having more  SOB. RN CM discussed patient's wgt and symptoms and the need to alert MD of changes. Patient voices understanding dn will call MD office today. She reports that she is followed by the HF clinic in [place]. Per patient, MD's want her to go to HF in [place] but due to transportation issues she is unable to go.   Medications: Patient voices taking about 16 meds. She states that all of her meds are costly in general but voices increased difficulty affording insulin-[name]. She reports that insulin is costing her $199.00/month. Patient voices that she manages her meds on her own.    Appointments: She saw PCP last on 12/10/17. She is also followed by cardiologists- Dr. Fletcher Anon and will call office today to alert them of her HF symptoms and wgt.  Consent: [facility] services reviewed and discussed with patient. Verbal consent for services given.  Plan: RN CM will send [facility] referral for possible transportation assistance. RN CM will send [place] referral for polypharmacy med review and possible med assistance. RN CM will send [facility] community nurse referral for further in home eval/assessment of care needs and management of chronic conditions and symptoms.       Plan:   Enzo Montgomery, RN,BSN,CCM [place] [facility] Telephonic Care Management Coordinator Direct Phone: [phone] Toll Free: [phone] Fax: [phone]

## 2017-12-31 NOTE — Telephone Encounter (Signed)
Last OV 12/10/17 last filled in patient at hospital

## 2018-01-01 ENCOUNTER — Ambulatory Visit: Payer: Self-pay

## 2018-01-01 ENCOUNTER — Other Ambulatory Visit: Payer: Self-pay | Admitting: *Deleted

## 2018-01-01 ENCOUNTER — Other Ambulatory Visit: Payer: Self-pay

## 2018-01-01 ENCOUNTER — Other Ambulatory Visit: Payer: Self-pay | Admitting: Pharmacist

## 2018-01-01 NOTE — Patient Outreach (Signed)
[facility]) Care Management  01/01/2018  Debra Saunders 11/28/1945 [id number]  72 y.o. year old female referred to [place] for Medication Assistance ([name]) Referred by [place] program for polypharmacy medication review and medication assistance in general and specifically for [name].  Was able to reach patient via telephone today and have scheduled a home visit for patient.  Plan: Will follow-up via scheduled home visit per patient's request.   Ruben Reason, PharmD Clinical Pharmacist, [facility] [phone]

## 2018-01-01 NOTE — Patient Outreach (Signed)
[facility]) Care Management  01/01/2018  THEADORA NOYES 1945-07-09 [id number]   Phone call to patient to discuss transportation resources. Per patient, her daughter usually takes her to appointments, however tends to miss appointments due to daughters work schedule. This Education officer, museum reviewed patient's transportation benefit through [place]. Contact phone number provided [phone] to call and schedule for rides to future appointment when needed. Patient verbalized having no additional community resource needs this time. Patient encouraged to call this social worker with any future issues or community resource concerns.    [name] [facility] Care Management [phone]

## 2018-01-01 NOTE — Patient Outreach (Signed)
[facility]) Care Management  01/01/2018  Debra Saunders 12-01-1945 [id number]   Care Coordination: Successful telephone encounter to the above patient. [facility] RN CM introduced role of [facility] Community Case Management. Verbal consent obtained by patient for services. Initial home visit scheduled for Tuesday July 16 at 10:00.  Plan: Initial home visit as scheduled for next week.  Ranae Casebier E. [name] RN, BSN [facility] Care Management Coordinator [phone]

## 2018-01-03 MED ORDER — TORSEMIDE 20 MG PO TABS: 20.0000 mg | ORAL_TABLET | Freq: Every day | ORAL | 0 refills | Status: DC

## 2018-01-05 ENCOUNTER — Telehealth: Payer: Self-pay | Admitting: Cardiovascular Disease

## 2018-01-05 ENCOUNTER — Other Ambulatory Visit: Payer: Self-pay

## 2018-01-05 ENCOUNTER — Other Ambulatory Visit: Payer: Self-pay | Admitting: *Deleted

## 2018-01-05 NOTE — Telephone Encounter (Signed)
Nurse Trish Fountain with [facility] calling asking for a call back She states she just saw patient at home and has a question about patient medication  She would like to know if patient is still to be taking Digoxin .125 mg   Please call back

## 2018-01-05 NOTE — Patient Outreach (Signed)
[facility]) Care Management  01/05/2018  Debra Saunders 06-04-1946 [id number]   Care Coordination: Successful telephone encounter to the above patient as follow-up to today's initial in home visit with RN CM. Discussed with patient that RN CM has not received follow up call from cardiologist to discuss medication reconciliation. Patient understands to discontinue digoxin until confirmation that she is supposed to take medication. No aldactone in home for patient to resume as ordered. RN CM unable to determine by reviewing all recent EMR notes status of above medications.   After discussion with CSW, RN CM gave patient phone number for [facility] transportation and clarified with patient that she has 6 round trip medical appointments yearly as a benefit. Also gave patient [facility] phone number. Patient states that clinic called patient with initial appointment however patient declined services related to transportation. Now that transportation barrier has been resolved, patient request that RN CM call HF clinic and reopen referral if able.  Plan:Contact [facility] for patient appointment. Follow up with cardiology office regarding medications if no response in 24 hours. Schedule routine follow-up call to patient in 2 weeks.  Conn Trombetta E. [name] RN, BSN [facility] Care Management Coordinator [phone]

## 2018-01-05 NOTE — Patient Outreach (Signed)
[facility]) Care Management  01/05/2018  Debra Saunders 11-19-1945 [id number]   Phone call to [place] a ride to confirm that patient has a benefit of 6 round trip rides with a 25 mile mileage limit. Patient will need to call at a minimum 3 days before her appointment date and the maximum 2 weeks before her appointment time.   RNCM Trish Fountain notified and will assist patient in setting up appointment to the hear failure clinic. Once appointment is made, patient can contact [employer] to set up the transportation at [place], [place] Management [phone]

## 2018-01-05 NOTE — Patient Outreach (Addendum)
[facility]) Care Management  Initial Home Visit 01/05/2018  DEDEE LISS 04/20/1946 [id number]  Debra Saunders is an 72 y.o. female  Subjective: "I am getting more tired easily like I did before I went into the hospital"  Objective:   BP (!) 158/80 (BP Location: Right Arm, Patient Position: Sitting, Cuff Size: Large)   Pulse 60   Resp 18   Wt 214 lb (97.1 kg)   SpO2 97%   BMI 41.79 kg/m   Physical Exam  Constitutional: She is oriented to person, place, and time. She appears well-developed and well-nourished.  Cardiovascular: Normal rate, regular rhythm, normal heart sounds and intact distal pulses.  Faint pedal pulses, mile non-pitting edema right lower leg and ankle, 2+ to left  Respiratory: Effort normal and breath sounds normal. No respiratory distress.  GI: Soft. Bowel sounds are normal.  Musculoskeletal: Normal range of motion.  Neurological: She is alert and oriented to person, place, and time.  Skin: Skin is warm and dry.  Psychiatric: She has a normal mood and affect. Her behavior is normal. Judgment and thought content normal.    Encounter Medications:   Outpatient Encounter Medications as of 01/05/2018  Medication Sig  . acetaminophen (TYLENOL) 325 MG tablet Take 325-650 mg by mouth every 6 (six) hours as needed for mild pain. Reported on 10/10/2015  . albuterol (PROVENTIL) (2.5 MG/3ML) 0.083% nebulizer solution Take 3 mLs (2.5 mg total) by nebulization every 6 (six) hours.  [name] amiodarone (PACERONE) 200 MG tablet Take 1 tablet (200 mg total) by mouth daily.  [name] apixaban (ELIQUIS) 5 MG TABS tablet Take 1 tablet (5 mg total) by mouth 2 (two) times daily.  . carvedilol (COREG) 3.125 MG tablet Take 1 tablet (3.125 mg total) by mouth 2 (two) times daily with a meal.  . Dulaglutide ([name]) 8.33 AS/5.0NL SOPN Inject 0.75 mg into the skin once a week.  . gabapentin (NEURONTIN) 100 MG capsule TAKE 2 CAPSULES BY MOUTH THREE TIMES DAILY  . glucose blood  (TRUE METRIX BLOOD GLUCOSE TEST) test strip Check blood glucose twice daily Dx:E11.8, Z79.4  . losartan (COZAAR) 25 MG tablet Take 1 tablet (25 mg total) by mouth daily.  . Menthol, Topical Analgesic, (BIOFREEZE EX) Apply 1 application topically as needed (for pain).   . metFORMIN (GLUCOPHAGE) 500 MG tablet Take 500 mg by mouth 2 (two) times daily with a meal.   . ondansetron (ZOFRAN) 8 MG tablet Take 1 tablet (8 mg total) by mouth every 8 (eight) hours as needed for nausea or vomiting.  . pantoprazole (PROTONIX) 40 MG tablet Take 1 tablet (40 mg total) by mouth daily.  . potassium chloride SA (K-DUR,KLOR-CON) 20 MEQ tablet Take 1 tablet (20 mEq total) by mouth 2 (two) times daily.  . rosuvastatin (CRESTOR) 10 MG tablet Take 1 tablet (10 mg total) by mouth daily.  . sildenafil (REVATIO) 20 MG tablet Take 20 mg by mouth 3 (three) times daily.  [name] torsemide (DEMADEX) 20 MG tablet Take 1 tablet (20 mg total) by mouth daily.  . [name] 9.76 BH/4.1PF SOPN INJECT 0.75MG  SUBCUTANEOUSLY ONCE A WEEK  . protein supplement shake (PREMIER PROTEIN) LIQD Take 325 mLs (11 oz total) by mouth 2 (two) times daily between meals. (Patient not taking: Reported on 01/05/2018)  . spironolactone (ALDACTONE) 25 MG tablet Take 0.5 tablets (12.5 mg total) by mouth daily. (Patient not taking: Reported on 01/05/2018)   No facility-administered encounter medications on file as of 01/05/2018.  Functional Status:   In your present state of health, do you have any difficulty performing the following activities: 01/05/2018 11/18/2017  Hearing? N N  Vision? Y Y  Difficulty concentrating or making decisions? Y N  Walking or climbing stairs? Y Y  Dressing or bathing? Y Y  Doing errands, shopping? [name]  Preparing Food and eating ? Y -  Using the Toilet? N -  In the past six months, have you accidently leaked urine? Y -  Do you have problems with loss of bowel control? Y -  Managing your Medications? Y -  Managing your  Finances? Y -  Housekeeping or managing your Housekeeping? Y -  Some recent data might be hidden    Fall/Depression Screening:    Fall Risk  01/05/2018 11/18/2017 09/21/2017  Falls in the past year? No No No  Risk for fall due to : - - -   PHQ 2/9 Scores 12/31/2017 11/18/2017 09/21/2017 07/01/2017 10/21/2016 10/21/2016 05/06/2016  PHQ - 2 Score 0 0 1 0 0 0 0  PHQ- 9 Score - - - 0 5 - -    Assessment:   Patient is doing well however she describes being more tired and lacking stamina and strength to do her normal in home activities. She states she has to stop related to breathlessness and lack of energy just walking to her front door. Her physical assessment is negative for fluid overload. Patient desperately does not want to go back into the hospital if preventable.  She denies needs for community services except transportation. Her daughter, [name] Marrow, is the patients stable caregiver however she does not drive in [place] traffic or during high traffic times in [place] related to anxiety. Patient has missed several medial appointments related to transportation. Chrystal Land, CSW is working with patient regarding these issues and has given patient instructions and information for scheduling transportation through [place]. Patient's transportation benefits include 6 round trip medical appointments a year.   Patient states she is at her HF baseline. Her weight is stable this morning at 214. She was 216 yesterday. She has chronic edema in the left leg and ankle related to past DVT. Her right leg and ankle have a trace of non-pitting edema. Lungs are clear. Verbalizes compliance with CPAP. Patient acknowledges not recording her daily weights. "I can remember them". However when RN CM asked patient what her weight was 5 days ago patient could not remember. Patient appreciated RN CM example of why she should record her weight in order to follow through with the CHF action plan. Patient agreed to record her  daily weights.   Patient takes her BP sporadically, but at least several times a week. She states it is always "high" and the bottom number "in the 90s". Patient does not record her blood pressure however commits to checking it daily and recording.  Patient has been instructed to establish care with the HF clinic at [facility] "because the doctors know more about my condition and are specialist". She has not made an appointment as of date related to her transportation barriers. She continues to be followed by Dr. Fletcher Anon and [name], NP. RN CM will facilitate new patient appointment for patient at clinic.  There were several medication discrepancies upon review of patients medications in EMR. Patient continues to take digoxin  152mcg daily from blisterpack she received from rehab however digoxin is no longer on patients medication profile. Patient also states she is not taking spironolactone although it  remains on her profile. RN CM placed call to Cardiology office for clarification and instructed patient to not take another dose of digoxin until medication is clarified. Awaiting call back. Patient has an in home appointment with [facility] Pharmacist next week.  Patient compliant with fasting am glucose checks. This am CBG 89. Patient states she has been directed to check her blood sugar TID however she has "gotten out of the habit". RN CM encouraged patient to check sugars atleast BID and record in [place].  Plan: Phone call follow-up with patient to complete medication reconciliation once clarification from MD, discuss with patient plan for scheduling new patient appointment with [facility], discuss transportation arrangements to this appointment, schedule follow-up appointment with [facility] RN CM.  [facility] CM Care Plan Problem One     Most Recent Value  Care Plan Problem One  HF-High risk for admission  Role Documenting the Problem One  Care Management [place] for Problem One  Active   [facility] Long Term Goal   Patient will not have hospital readmission for HF within the next 31 days  [facility] Long Term Goal Start Date  01/05/18  Interventions for Problem One Long Term Goal  reinforced HF action plan, green, yellow, red zone, daily weights and record, assist patient with transportation planning to promote scheduling of new appointment at HF clinic in [place] #1   Patient will weigh daily and record in provided [facility] Calendar within the next 30 days  [facility] CM Short Term Goal #1 Start Date  01/05/18  Interventions for Short Term Goal #1  discussed with patient importance of knowing weekly weight gain as well as daily. Reinforced yellow zone interventions  [facility] CM Short Term Goal #2   patient will call and make appointment with HF clinic at [facility] within the next 2 weeks  [facility] CM Short Term Goal #2 Start Date  01/05/18  Interventions for Short Term Goal #2  reinforced follow thru with transportation scheduling as previously discussed by CSW, educated patient on HF/PAH and importance of seeing appropriate type of MD for her condition, consulted with CSW regarding patients transportation barriers  [facility] CM Short Term Goal #3  patient will verbalize decreasing her sodium intake as evidenced by decreasing the amount of take out meals she consumes over the next 30 days  [facility] CM Short Term Goal #3 Start Date  01/05/18  [facility] CM Short Term Goal #4  patient will take all medications as prescribed over the next 30 days  [facility] CM Short Term Goal #4 Start Date  01/05/18  Interventions for Short Term Goal #4  RN CM called cardiology office to clarify medication discrepency cardiac medications, reinforced importance of controlling HR with medications to prevent HF worsening      Erminie Foulks E. [name] RN, BSN [facility] Care Management Coordinator [phone]

## 2018-01-06 ENCOUNTER — Other Ambulatory Visit: Payer: Self-pay

## 2018-01-06 NOTE — Telephone Encounter (Signed)
I don't see Digoxin on pt's medication list or several visits from last visit. Could you please confirm if pt should be taken Digoxin per [place].

## 2018-01-06 NOTE — Telephone Encounter (Signed)
Spoke with Pharmacologist regarding patient and she states there is a couple of discrepancies with her medications. She reports that patient is taking Digoxin which is not listed in her medications. Looking back it appears this was added during a hospital visit back on 08/28/17. She also identified that patient is not taking the spironolactone but is taking the amiodarone. Patient is taking the demadex. Advised that I would route this to a provider for review. I do not see in any previous office visits where digoxin was listed or addressed. Nurse reports that patients medications are bubble packed and so she wanted to make sure about these discrepancies. Advised that I would route to provider and would be in touch with any further changes or recommendations.

## 2018-01-06 NOTE — Telephone Encounter (Signed)
She should not be on digoxin.  That was d/c'd on her April admission and not resumed at discharge in the setting of renal failure during that hospitalization, along with initiation of amio (she should be taking amio). We do want her on spiro.  If she is not taking, she should start @ 12.5 mg daily w/ f/u bmet in a week.

## 2018-01-06 NOTE — Patient Outreach (Signed)
[facility]) Care Management  01/06/2018  Debra Saunders 12-31-1945 [id number]  Incoming call from [facility] Cardiology triage RN, following up on message left by RN CM yesterday regarding medication discrepancy/reconciliation. Triage RN will send message to provider for clarification.  [facility] RN CM called [name] to schedule appointment for the above patient related to transportation barrier to any [facility] provider has been resolved. RN CM was told by scheduler to request appointment through in basket to [employer], Therapist, sports.  Plan: Will send inbasket as directed to [name], RN. Await call from [facility] to clarify medication discrepancy and follow up with patient at that time.  Wendall Isabell E. [name] RN, BSN [facility] Care Management Coordinator [phone]

## 2018-01-07 ENCOUNTER — Other Ambulatory Visit: Payer: Self-pay

## 2018-01-07 MED ORDER — SPIRONOLACTONE 25 MG PO TABS: 12.5000 mg | ORAL_TABLET | Freq: Every day | ORAL | 3 refills | Status: DC

## 2018-01-07 NOTE — Patient Outreach (Signed)
[facility]) Care Management  01/07/2018  Debra Saunders 06-28-1945 [id number]   Care Coordination: Spoke with Anderson Malta, RN at [facility]. Anderson Malta confirmed patient should not be taking Digoxin and should continue to take Spironolactone 25 mg (take 0.5 tablets daily). RN CM informed Anderson Malta that patient states she does not have the spironolactone in her home and will need medication called to [place]. Anderson Malta confirmed prescription will be called in.  RN CM called patient to inform her of medication changes. No answer. No VM. Will attempt to reach patient later today.  Zedekiah Hinderman E. [name] RN, BSN [facility] Care Management Coordinator [phone]

## 2018-01-07 NOTE — Telephone Encounter (Signed)
No answer. Left message to call back.   

## 2018-01-07 NOTE — Patient Outreach (Signed)
[facility]) Care Management  01/07/2018  Debra Saunders 1946-06-14 [id number]   Successful telephone outreach to the above patient to discuss medication discrepancy found during initial home visit. Informed patient she is to discontinue digoxin immediately and to resume spironolactone as previously prescribed. Instructed patient to pick up prescription at her [place]. Patient verbalized understanding through teach back.  Kaylon Laroche E. [name] RN, BSN [facility] Care Management Coordinator [phone]

## 2018-01-07 NOTE — Telephone Encounter (Signed)
S/w [name]. She verbalized understanding that patient is not to be on the digoxin but is to be on the amiodarone and spironolactone. Rx for spiro sent to pharmacy per request. Truddie Crumble also let me know that patient has 6 round trip visits to doctor's appointments through her [employer]. Truddie Crumble is in the process of setting up appointment with the Advanced Heart Failure clinic in [place]. Patient was originally supposed to go but declined the appointment due to transportation issues.

## 2018-01-13 DIAGNOSIS — I509 Heart failure, unspecified: Secondary | ICD-10-CM | POA: Diagnosis not present

## 2018-01-14 ENCOUNTER — Other Ambulatory Visit: Payer: Self-pay | Admitting: Pharmacist

## 2018-01-14 ENCOUNTER — Other Ambulatory Visit: Payer: Self-pay

## 2018-01-14 ENCOUNTER — Telehealth: Payer: Self-pay | Admitting: Cardiovascular Disease

## 2018-01-14 NOTE — Patient Outreach (Signed)
[place] [facility]) Care Management  [place]   01/14/2018  Debra Saunders [birth year] [id number]  Subjective: 72 y.o. year old female referred to [place] for Medication Adherence and Medication Assistance ([name], Eliquis) Referred by Enzo Montgomery for polypharmacy medication review and medication assistance for [name].   PMH s/f: HTN, chronic heart failure, afib, PE, chronic DVT, pulmonary HTN, chronic respiratory failure, diabetes, hypothyroidism, neuropathy, HLD  Patient with [facility] Advantage plan.   Patient confirms identity with HIPAA-identifiers x2 and gives verbal consent to speak about medications. Visit was conducted in patient's home today.   Medication Adherence: Fill history records and patient's personal report indicate pretty accurate adherence. Patient verbalizes well when she takes each medication, matching what is in her chart. Uses a pill box to manage her medications. Has 90 days supply for some medications and 30 days supply for others. She prefers 30 day supplies because she finds all the extra bottles from 90 day supplies to be overwhelming. She keeps her medications and medication list along with a pill cutter in a bag.    Medication Assistance:  Difficulty affording [name], Eliquis. Already enrolled in a program for sildenafil for PAF (unsure of which physician office facilitated this application). Patient could use [employer] mail order pharmacy to receive Tier 1 and Tier 2 medications at zero dollar copay, but she has mobility difficulties and is not able to ambulate to her mailbox. She therefore prefers to use her local [place].   Medication Management:  #HTN: carvdilol, losartan #chronic heart failure: carvedilol. Potassium chloride, spironloactone, torsemide #afib: Eliquis, amiodarone #PE, chronic DVT: Eliquis #pulmonary HTN: sildenafil #chronic respiratory failure: albuterol, oxygen #diabetes: metformin,  [name] #hypothyroidism:  #neuropathy: gabapentin #HLD: rosuvastatin   Objective:  Encounter Medications: Outpatient Encounter Medications as of 01/14/2018  Medication Sig Note  . acetaminophen (TYLENOL) 325 MG tablet Take 325-650 mg by mouth every 6 (six) hours as needed for mild pain. Reported on 10/10/2015   . amiodarone (PACERONE) 200 MG tablet Take 1 tablet (200 mg total) by mouth daily.   [name] apixaban (ELIQUIS) 5 MG TABS tablet Take 1 tablet (5 mg total) by mouth 2 (two) times daily.   . carvedilol (COREG) 3.125 MG tablet Take 1 tablet (3.125 mg total) by mouth 2 (two) times daily with a meal.   . gabapentin (NEURONTIN) 100 MG capsule TAKE 2 CAPSULES BY MOUTH THREE TIMES DAILY   . 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 losartan (COZAAR) 25 MG tablet Take 1 tablet (25 mg total) by mouth daily.   . metFORMIN (GLUCOPHAGE) 500 MG tablet Take 500 mg by mouth 2 (two) times daily with a meal.    . ondansetron (ZOFRAN) 8 MG tablet Take 1 tablet (8 mg total) by mouth every 8 (eight) hours as needed for nausea or vomiting.   . potassium chloride SA (K-DUR,KLOR-CON) 20 MEQ tablet Take 1 tablet (20 mEq total) by mouth 2 (two) times daily.   . rosuvastatin (CRESTOR) 10 MG tablet Take 1 tablet (10 mg total) by mouth daily.   . sildenafil (REVATIO) 20 MG tablet Take 20 mg by mouth 3 (three) times daily.   [name] spironolactone (ALDACTONE) 25 MG tablet Take 0.5 tablets (12.5 mg total) by mouth daily.   [name] torsemide (DEMADEX) 20 MG tablet Take 1 tablet (20 mg total) by mouth daily. 01/14/2018: Takes an additional tablet prn based on weight  . [name] 4.09 WJ/1.9JY  SOPN INJECT 0.75MG  SUBCUTANEOUSLY ONCE A WEEK 01/14/2018: Sunday  . albuterol (PROVENTIL) (2.5 MG/3ML) 0.083% nebulizer solution Take 3 mLs (2.5 mg total) by nebulization every 6 (six) hours. (Patient not taking: Reported on 01/14/2018)   . Menthol, Topical Analgesic, (BIOFREEZE  EX) Apply 1 application topically as needed (for pain).    . [DISCONTINUED] Dulaglutide ([name]) 8.54 OE/7.0JJ SOPN Inject 0.75 mg into the skin once a week.   . [DISCONTINUED] pantoprazole (PROTONIX) 40 MG tablet Take 1 tablet (40 mg total) by mouth daily.   . [DISCONTINUED] protein supplement shake (PREMIER PROTEIN) LIQD Take 325 mLs (11 oz total) by mouth 2 (two) times daily between meals. (Patient not taking: Reported on 01/05/2018)    No facility-administered encounter medications on file as of 01/14/2018.     Functional Status: In your present state of health, do you have any difficulty performing the following activities: 01/05/2018 11/18/2017  Hearing? N N  Vision? Y Y  Difficulty concentrating or making decisions? Y N  Walking or climbing stairs? Y Y  Dressing or bathing? Y Y  Doing errands, shopping? [name]  Preparing Food and eating ? Y -  Using the Toilet? N -  In the past six months, have you accidently leaked urine? Y -  Do you have problems with loss of bowel control? Y -  Managing your Medications? Y -  Managing your Finances? Y -  Housekeeping or managing your Housekeeping? Y -  Some recent data might be hidden    Fall/Depression Screening: Fall Risk  01/05/2018 11/18/2017 09/21/2017  Falls in the past year? No No No  Risk for fall due to : - - -   PHQ 2/9 Scores 12/31/2017 11/18/2017 09/21/2017 07/01/2017 10/21/2016 10/21/2016 05/06/2016  PHQ - 2 Score 0 0 1 0 0 0 0  PHQ- 9 Score - - - 0 5 - -   Assessment:  Drugs sorted by system:  Neurologic/Psychologic: gabapentin  Cardiovascular: amiodarone, apixaban, carvedilol, losartan, rosuvastatin, potassium chloride, spironolactone, torseminde  Pulmonary/Allergy: albuterol neb, sildenafil  Endocrine: metformin, [name]  Pain: APAP  Medications to avoid in the elderly: apixaban, gabapentin Drug interactions: spironolactone-potassium chloride (hyperkalemia),   Plan: 1. Updated medication list to reflect medications.  2.  Patient was out of refills for carvedilol. Patient states that Dr. Josephina Gip told her to have Dr. Fletcher Anon prescribe all cardiovascular medications from now on. Called Dr. Tyrell Antonio office, requested refill of carvedilol to be sent to patient's preferred pharmacy [facility]).  [place] and requested OTC catalog for patient's quarterly OTC benefit.  4. Began applications for Eliquis (BMS) and [name] [employer] commercial). Scanned in patient's benefit statement letter from [employer] administration. Patient is over income limit for Medicare Extra Help. Will add Etter Sjogren, [facility], to the care team to facilitate these applications, but do not need to have applications mailed to the patient as they were printed and signed at visit. Need EOB document from [facility]. Patient counseled on this and is aware of what to look for in the mail.  5. Follow up in one week.   Ruben Reason, PharmD Clinical Pharmacist, [place] [facility] [phone]

## 2018-01-14 NOTE — Telephone Encounter (Signed)
Debra Saunders with [facility] calling    *STAT* If patient is at the pharmacy, call can be transferred to refill team.   1. Which medications need to be refilled? (please list name of each medication and dose if known)  Carvedilol Amiodarone   Losartan  Torsemide   2. Which pharmacy/location (including street and city if local pharmacy) is medication to be sent to? [facility] on [address]   3. Do they need a 30 day or 90 day supply? 30 Day

## 2018-01-14 NOTE — Patient Outreach (Signed)
[facility]) Care Management  01/14/2018  RAEDYN KLINCK 1945-10-23 [id number]  Care Coordination: RN CM received incoming call from [place] at [facility]. Patient is scheduled to see Dr. Haroldine Laws on Wednesday September 18 at 11:20.  RN CM call patient and notified of new appointment. Patient also instructed to call for [facility] transportation on 03/03/18. Patient verified number for transportation services.  Patient states she was doing well. States she did not have a "good day" yesterday. She is compliant with taking her BP and recording daily. States her BP reading yesterday morning was 187/135. Patient denied symptoms. This was very concerning to patient and cause her great anxiety. Took medications as prescribed. Denied intake of high sodium foods. She did not call her physician. Afternoon BP reading 134/90. Patient states she is less anxious today. Today's BP 108/79. Patient instructed to check BP in other arm when she feels reading are extreme and to notify MD with severe hypertension and to see emergency care with symptomatic HTN. Patient reeducated on S/S of stroke and MI. Patient will continue to check and document BP over the next several days and RN CM will follow up with patient on Monday 01/18/18.  Plan: Follow up call next week as scheduled  Zyaire Mccleod E. [name] RN, BSN [facility] Care Management Coordinator [phone]

## 2018-01-14 NOTE — Patient Outreach (Signed)
[place] [facility]) Care Management  01/14/2018  Debra Saunders [birth year] [id number]  Successful outreach to [facility] on behalf of the above patient to see if patient able to be established as new patient now that transportation to and from [place] has been arranged. Spoke with [name]. She will pass message to [name], RN and call back with appointment or delays.  [name] E. [name] RN, BSN [facility] Care Management Coordinator [phone]

## 2018-01-15 ENCOUNTER — Other Ambulatory Visit: Payer: Self-pay

## 2018-01-15 MED ORDER — AMIODARONE HCL 200 MG PO TABS: 200.0000 mg | ORAL_TABLET | Freq: Every day | ORAL | 0 refills | Status: DC

## 2018-01-15 MED ORDER — CARVEDILOL 3.125 MG PO TABS: 3.1250 mg | ORAL_TABLET | Freq: Two times a day (BID) | ORAL | 0 refills | Status: DC

## 2018-01-15 MED ORDER — TORSEMIDE 20 MG PO TABS: 20.0000 mg | ORAL_TABLET | Freq: Every day | ORAL | 0 refills | Status: DC

## 2018-01-15 MED ORDER — LOSARTAN POTASSIUM 25 MG PO TABS: 25.0000 mg | ORAL_TABLET | Freq: Every day | ORAL | 0 refills | Status: DC

## 2018-01-15 NOTE — Telephone Encounter (Signed)
30 Day refills sent

## 2018-01-18 ENCOUNTER — Other Ambulatory Visit: Payer: Self-pay

## 2018-01-18 NOTE — Patient Outreach (Signed)
[facility]) Care Management  01/18/2018  Debra Saunders 05-05-1946 [id number]  2 week follow-up: Successful telephone encounter to check on patients progression towards goals 2 weeks post initial home visit. Patient doing well today. She is recording daily BP, weight, and CBGs. Concerned last week over high BP. Patient states pressure today 164/109 immediately after completion of ADLs however after she had rested BP 140/80. Patient asymptomatic. Has taken all morning medication. Weight 214lbs, Denies increased SOB with exertion or lower extremity edema. Fasting CBG 114. RN CM discussed with patient BP will increase significantly with exertion when work of breathing increases and will decrease with rest. Patient verbalizes understanding of education.   Patient states she has picked up all medication from pharmacy. Importance of medication adherence reinforced. Patient agrees to take her BP log to cardiology appointment on 02/02/18. Patient also has appointment with [facility] HF clinic 8/15. Patient states she is unsure if her daughter can get off work to take her to both. "I may have to miss one". RN CM reinforced importance of follow-up with cardiology and HF as it relates to her concern for increased BP. Patient acknowledges. RN CM instructed patient to use [employer] transportation if daughter unable to provide transportation to these appointments. Patient states she was trying to save [place] transportation for [place] appointments but agrees to use.  Plan: Home visit in 2 weeks   Shakeena Kafer E. [name] RN, BSN [facility] Care Management Coordinator [phone]

## 2018-01-20 ENCOUNTER — Other Ambulatory Visit: Payer: Self-pay | Admitting: Pharmacy Technician

## 2018-01-20 NOTE — Patient Outreach (Signed)
[facility]) Care Management  01/20/2018  Debra Saunders 10-Oct-1945 [id number]   THN RPh Ruben Reason had home visit with patient and obtained patients signature on [employer] patient assistance applications. I faxed provider portion of [facility] (Eliquis) to Dr. Fletcher Anon and faxed [address] ([name]) to Dr. [name].  Once applications are returned, I will submit to companies.  Debra Saunders [place], [place] Management [phone]

## 2018-01-25 DIAGNOSIS — J9611 Chronic respiratory failure with hypoxia: Secondary | ICD-10-CM | POA: Diagnosis not present

## 2018-01-28 ENCOUNTER — Ambulatory Visit: Payer: Self-pay | Admitting: Pharmacist

## 2018-01-28 ENCOUNTER — Encounter: Payer: Self-pay | Admitting: Family Medicine

## 2018-01-29 ENCOUNTER — Ambulatory Visit: Payer: Self-pay | Admitting: Pharmacist

## 2018-02-01 ENCOUNTER — Other Ambulatory Visit: Payer: Self-pay | Admitting: Pharmacist

## 2018-02-01 NOTE — Patient Outreach (Signed)
[facility]) Care Management  02/01/2018  Debra Saunders Apr 18, 1946 [id number]   Follow up call today with patient. Verified identity with HIPAA identifiers x 2.   #[employer] catalog received?  #status of MAPs- Eliquis and [name]  Patient states that her [name] will cost $871. Will inquire about samples with Dr. [name]. Will follow up Wednesday. Patient has appointment with cardiology tomorrow.   Ruben Reason, PharmD Clinical Pharmacist, [facility] [phone]

## 2018-02-02 ENCOUNTER — Encounter: Payer: Self-pay | Admitting: Physician Assistant

## 2018-02-02 ENCOUNTER — Ambulatory Visit: Payer: Medicare HMO | Admitting: Physician Assistant

## 2018-02-02 VITALS — BP 150/80 | HR 61 | Ht 60.0 in | Wt 213.8 lb

## 2018-02-02 DIAGNOSIS — I10 Essential (primary) hypertension: Secondary | ICD-10-CM

## 2018-02-02 DIAGNOSIS — I2721 Secondary pulmonary arterial hypertension: Secondary | ICD-10-CM | POA: Diagnosis not present

## 2018-02-02 DIAGNOSIS — I340 Nonrheumatic mitral (valve) insufficiency: Secondary | ICD-10-CM | POA: Diagnosis not present

## 2018-02-02 DIAGNOSIS — I481 Persistent atrial fibrillation: Secondary | ICD-10-CM

## 2018-02-02 DIAGNOSIS — I480 Paroxysmal atrial fibrillation: Secondary | ICD-10-CM | POA: Diagnosis not present

## 2018-02-02 DIAGNOSIS — I5042 Chronic combined systolic (congestive) and diastolic (congestive) heart failure: Secondary | ICD-10-CM

## 2018-02-02 DIAGNOSIS — N179 Acute kidney failure, unspecified: Secondary | ICD-10-CM | POA: Diagnosis not present

## 2018-02-02 DIAGNOSIS — I4819 Other persistent atrial fibrillation: Secondary | ICD-10-CM

## 2018-02-02 DIAGNOSIS — J9611 Chronic respiratory failure with hypoxia: Secondary | ICD-10-CM

## 2018-02-02 DIAGNOSIS — I5022 Chronic systolic (congestive) heart failure: Secondary | ICD-10-CM | POA: Diagnosis not present

## 2018-02-02 NOTE — Progress Notes (Signed)
Cardiology Office Note Date:  02/02/2018  Patient ID:  Camella, Seim 1945/06/27, MRN [id number] PCP:  [name], MD  Cardiologist:  Dr. Fletcher Anon, MD    Chief Complaint: Follow up  History of Present Illness: MOUNA YAGER is a 72 y.o. female with history of nonobstructive CAD by cath in 09/2017, chronic combined systolic and diastolic CHF, persistent Afib on Eliquis, symptomatic bradycardia status post permanent pacemaker, frequent PVCs, chronic respiratory failure with hypoxia on home O2 secondary to COPD, DM2, HTN, HLD, obesity, traumatic subarachnoid hemorrhage in the setting of Xarelto, left lower extremity DVT status post IVC filter, chronic right-sided atypical chest pain, chronic left lower extremity lymphedema, prior stroke, sleep apnea, cardiorenal syndrome, and pulmonary hypertension who presents for follow-up of her cardiomyopathy.   Most recently, she has been dealing with atrial fibrillation and chronic diastolic heart failure with volume overload, weight gain, and dyspnea. She was hospitalized in February 2019, with heart failure and Afib with RVR. During that admission, she was diuresed and after review of prior neurosurgical records, she was placed on Eliquis. She later underwent TEE/DCCV and was noted to have intermittent paroxysmal Afib post cardioversion but was subsequently discharged to a skilled nursing facility in sinus rhythm. Following hospital discharge, she felt poorly and was volume overloaded at follow-up on 3/25. She was also noted to be back in Afib. Diuretics were changed to torsemide but unfortunately, she continued to have persistent volume overload, dyspnea, orthopnea, and weight gain. She was sent to the emergency room for admission on 4/10 and was placed on IV Lasix. She had some initial improvement but continued to have volume overload, even after cardioversion. She then developed acute kidney injury. She was seen by nephrology and required 2  separate sessions of hemodialysis. Creatinine peaked at 2.7. Repeat echo during hospitalization showed a pulmonary artery systolic pressure of 63.  As creatinine continued to improve with diuretics on hold, she underwent right and left heart catheterization on 4/29 revealing minimal nonobstructive CAD and elevated right heart pressures, including a PA pressure of 84/29. Wedge was 20. It was felt that pulmonary hypertension was out of proportion to left-sided heart failure. She was placed on low-dose torsemide, with recommendation for outpatient advanced heart failure follow-up. Unfortunately, Ms. Wingler has been unable to obtain a ride to [facility] for heart failure clinic. She was discharged [facility] with a weight of 231 pounds and ultimately was noted to have a weight that had trended down to 206 pounds. She was seen in the ED 5/16 with elevated BP and upper chest congestion. BNP 989 at that time with a troponin of 0.03-->0.04. She was treated with IV Lasix and discharged home. She was most recently seen in the office on 5/23 and was doing reasonably well. BP was well controlled at that time at 120/70 with a weight of 210 pounds. She was seen in the [facility] on 5/29 with a weight of 212 pounds. She was advised to take torsemide an extra torsemide 20 mg (for a total of 40 mg) that day. Labs checked that day showed a slight bump in her BUN/SCr to 29/1.11. Recheck labs on 12/04/17 showed continued bump in SCr to 1.21 as well as hypokalemia of 2.9. She was advised to resume KCl 20 mEq daily and hold torsemide for 1 day. Most recent labs from 12/10/2017 show a K+ of 4.3, BUN/SCr 26/1.16, normal liver function, HGB 10.4, TSH normal. She was advised to hold torsemide for 1  day then resume 20 mg daily.   She comes in doing reasonably well from a cardiac perspective today.  She remains on her baseline 4 L supplemental oxygen via nasal cannula.  Weights have mostly fluctuated between 211 to 215  pounds.  If she notices a weight gain greater than 2 pounds overnight she is taking an extra 20 mg of torsemide.  She is drinking less than 2 L of fluids per day.  She does get her meals from [employer] and denies adding any salt to her meals.  She does not eat out at restaurants.  She has no orthopnea, PND, cough, abdominal distention, or early satiety.  In fact, her appetite seems to be improving.  She has a chronic lymphedema affecting the left lower extremity that has been unchanged for several years.  She denies any chest pain, palpitations, PND, orthopnea, dizziness, syncope, or presyncope.  She does have an appointment with the advanced heart failure team on September, 18 and will have transportation to that appointment provided by her health insurance.  Past Medical History:  Diagnosis Date  . Arthritis   . Atrial fibrillation, persistent ([place])    a. s/p TEE-guided DCCV on 08/20/2017  . Cardiorenal syndrome    a. 09/2017 Creat rose to 2.7 w/ diuresis-->HD x 2.  . Chest pain    a. 01/2012 Cath: nonobs dzs;  c. 04/2014 MV: No ischemia.  . Chronic back pain   . Chronic combined systolic (congestive) and diastolic (congestive) heart failure ([place])    a. Dx 2005 @ [facility];  b. 02/2014 Echo: Ef 55-60%, no rwma, mild MR, PASP 84mmHg. c. 07/2017: EF 30-35%, diffuse HK, trivial AI, severe MR, moderate TR; d. 09/2017 Echo: EF 40-45%, ant, antsep, ap, basal HK. Mod MR, mod dil LA, mod RV dil w/ mildly reduced fxn, mod dil RA, mod TR, PASP 39mmHg.  [name] Chronic respiratory failure (HCC)    a. on home O2  . COPD (chronic obstructive pulmonary disease) ([place])    a. Home O2 - 3lpm  . Gastritis   . History of intracranial hemorrhage    a. 6/14 described by neurosurgery as "small frontal posttraumatic SAH " - pt was on xarelto @ the time.  . Hyperlipidemia   . Hypertension   . Lipoma of back   . Lymphedema    a. Chronic LLE edema.  [name] NICM (nonischemic cardiomyopathy) ([place])    a. 09/2017 Echo: EF 40-45%; b.  09/2017 Cath: minor, insignificant CAD.  [name] Obesity   . PAH (pulmonary artery hypertension) ([place])    a. 09/2017 Echo: PASP 42mmHg; b. 09/2017 RHC: RA 23, RV 84/13, PA 84/29, PCWP 20, CO 3.89, CI 1.89. LVEDP 18.  . Sepsis with metabolic encephalopathy ([place])   . Sleep apnea   . Streptococcal bacteremia   . Stroke ([place])   . Symptomatic bradycardia    a. s/p BSX PPM.  . Thyroid nodule   . Type II diabetes mellitus ([place])   . Urinary incontinence     Past Surgical History:  Procedure Laterality Date  . ABDOMINAL HYSTERECTOMY  1969  . CARDIAC CATHETERIZATION  2013   @ [place]: No obstructive CAD: Only 20% ostial left circumflex.  [name] CARDIOVERSION N/A 10/12/2017   Procedure: CARDIOVERSION;  Surgeon: Minna Merritts, MD;  Location: [facility] ORS;  Service: Cardiovascular;  Laterality: N/A;  . CHOLECYSTECTOMY    . CYST REMOVAL TRUNK  2002   BACK  . DIALYSIS/PERMA CATHETER INSERTION N/A 10/06/2017   Procedure: DIALYSIS/PERMA  CATHETER INSERTION;  Surgeon: Katha Cabal, MD;  Location: [place] CV LAB;  Service: Cardiovascular;  Laterality: N/A;  . INSERT / REPLACE / REMOVE PACEMAKER    . lipoma removal  2014   back  . PACEMAKER INSERTION  [address] Scientific dual chamber pacemaker implanted by Dr Caryl Comes for symptomatic bradycardia  . PERMANENT PACEMAKER INSERTION N/A 01/09/2014   Procedure: PERMANENT PACEMAKER INSERTION;  Surgeon: Deboraha Sprang, MD;  Location: [facility] CATH LAB;  Service: Cardiovascular;  Laterality: N/A;  . RIGHT/LEFT HEART CATH AND CORONARY ANGIOGRAPHY N/A 10/19/2017   Procedure: RIGHT/LEFT HEART CATH AND CORONARY ANGIOGRAPHY;  Surgeon: Wellington Hampshire, MD;  Location: [place] CV LAB;  Service: Cardiovascular;  Laterality: N/A;  . TEE WITHOUT CARDIOVERSION N/A 08/20/2017   Procedure: TRANSESOPHAGEAL ECHOCARDIOGRAM (TEE) & Direct current cardioversion;  Surgeon: Minna Merritts, MD;  Location: [facility] ORS;  Service: Cardiovascular;  Laterality: N/A;  . TRIGGER FINGER  RELEASE    . VAGINAL HYSTERECTOMY      Current Meds  Medication Sig  . acetaminophen (TYLENOL) 325 MG tablet Take 325-650 mg by mouth every 6 (six) hours as needed for mild pain. Reported on 10/10/2015  . albuterol (PROVENTIL) (2.5 MG/3ML) 0.083% nebulizer solution Take 3 mLs (2.5 mg total) by nebulization every 6 (six) hours.  [name] amiodarone (PACERONE) 200 MG tablet Take 1 tablet (200 mg total) by mouth daily.  [name] apixaban (ELIQUIS) 5 MG TABS tablet Take 1 tablet (5 mg total) by mouth 2 (two) times daily.  . carvedilol (COREG) 3.125 MG tablet Take 1 tablet (3.125 mg total) by mouth 2 (two) times daily with a meal.  . gabapentin (NEURONTIN) 100 MG capsule TAKE 2 CAPSULES BY MOUTH THREE TIMES DAILY  . glucose blood (TRUE METRIX BLOOD GLUCOSE TEST) test strip Check blood glucose twice daily Dx:E11.8, Z79.4  . losartan (COZAAR) 25 MG tablet Take 1 tablet (25 mg total) by mouth daily.  . Menthol, Topical Analgesic, (BIOFREEZE EX) Apply 1 application topically as needed (for pain).   . metFORMIN (GLUCOPHAGE) 500 MG tablet Take 500 mg by mouth 2 (two) times daily with a meal.   . ondansetron (ZOFRAN) 8 MG tablet Take 1 tablet (8 mg total) by mouth every 8 (eight) hours as needed for nausea or vomiting.  . potassium chloride SA (K-DUR,KLOR-CON) 20 MEQ tablet Take 1 tablet (20 mEq total) by mouth 2 (two) times daily.  . rosuvastatin (CRESTOR) 10 MG tablet Take 1 tablet (10 mg total) by mouth daily.  . sildenafil (REVATIO) 20 MG tablet Take 20 mg by mouth 3 (three) times daily.  [name] spironolactone (ALDACTONE) 25 MG tablet Take 0.5 tablets (12.5 mg total) by mouth daily.  [name] torsemide (DEMADEX) 20 MG tablet Take 1 tablet (20 mg total) by mouth daily.  . [name] 5.57 DU/2.0UR SOPN INJECT 0.75MG  SUBCUTANEOUSLY ONCE A WEEK    Allergies:   Aspirin; Other; and Penicillins   Social History:  The patient  reports that she quit smoking about 27 years ago. Her smoking use included cigarettes. She has a 0.60  pack-year smoking history. She has never used smokeless tobacco. She reports that she does not drink alcohol or use drugs.   Family History:  The patient's family history includes Breast cancer in her maternal aunt; COPD in her mother; Heart disease in her mother; Hypertension in her father, mother, sister, and sister; Thyroid disease in her mother, sister, and sister.  ROS:   Review of Systems  Constitutional: Positive for  malaise/fatigue. Negative for chills, diaphoresis, fever and weight loss.  HENT: Negative for congestion.   Eyes: Negative for discharge and redness.  Respiratory: Positive for shortness of breath. Negative for cough, hemoptysis, sputum production and wheezing.   Cardiovascular: Positive for leg swelling. Negative for chest pain, palpitations, orthopnea, claudication and PND.  Gastrointestinal: Negative for abdominal pain, blood in stool, heartburn, melena, nausea and vomiting.  Genitourinary: Negative for hematuria.  Musculoskeletal: Negative for falls and myalgias.  Skin: Negative for rash.  Neurological: Positive for weakness. Negative for dizziness, tingling, tremors, sensory change, speech change, focal weakness and loss of consciousness.  Endo/Heme/Allergies: Does not bruise/bleed easily.  Psychiatric/Behavioral: Negative for substance abuse. The patient is not nervous/anxious.   All other systems reviewed and are negative.    PHYSICAL EXAM:  VS:  BP (!) 150/80 (BP Location: Left Arm, Patient Position: Sitting, Cuff Size: Large)   Pulse 61   Ht 5' (1.524 m)   Wt 213 lb 12 oz (97 kg)   BMI 41.75 kg/m  BMI: Body mass index is 41.75 kg/m.  Physical Exam  Constitutional: She is oriented to person, place, and time. She appears well-developed and well-nourished.  HENT:  Head: Normocephalic and atraumatic.  Eyes: Right eye exhibits no discharge. Left eye exhibits no discharge.  Neck: Normal range of motion. No JVD present.  JVD is difficult to assess secondary  to body habitus and supplemental oxygen  Cardiovascular: Normal rate, regular rhythm, S1 normal and S2 normal. Exam reveals no distant heart sounds, no friction rub, no midsystolic click and no opening snap.  Murmur heard. High-pitched blowing holosystolic murmur is present with a grade of 2/6 at the apex. Pulses:      Posterior tibial pulses are 2+ on the right side, and 2+ on the left side.  Pulmonary/Chest: Effort normal and breath sounds normal. No respiratory distress. She has no decreased breath sounds. She has no wheezes. She has no rales. She exhibits no tenderness.  Abdominal: Soft. She exhibits no distension. There is no tenderness.  Musculoskeletal: She exhibits edema.  Chronic 1-2+ left lower extremity nonpitting edema  Neurological: She is alert and oriented to person, place, and time.  Skin: Skin is warm and dry. No cyanosis. Nails show no clubbing.  Psychiatric: She has a normal mood and affect. Her speech is normal and behavior is normal. Judgment and thought content normal.     EKG:  Was ordered and interpreted by me today. Shows AV paced rhythm, 61 bpm, rare PVCs, diffuse T wave inversion, similar to prior EKGs  Recent Labs: 10/22/2017: Magnesium 1.3 11/05/2017: B Natriuretic Peptide 989.0 12/10/2017: ALT 11; BUN 26; Creatinine, Ser 1.16; Hemoglobin 10.4; Platelets 235.0; Potassium 4.3; Sodium 140; TSH 1.04  08/17/2017: Cholesterol 102; HDL 52; LDL Cholesterol 37; Total CHOL/HDL Ratio 2.0; Triglycerides 67; VLDL 13   CrCl cannot be calculated (Patient's most recent lab result is older than the maximum 21 days allowed.).   Wt Readings from Last 3 Encounters:  02/02/18 213 lb 12 oz (97 kg)  01/18/18 214 lb (97.1 kg)  01/05/18 214 lb (97.1 kg)     Other studies reviewed: Additional studies/records reviewed today include: summarized above  ASSESSMENT AND PLAN:  1. Chronic combined systolic and diastolic CHF: She does appear mildly volume up today.  She will take an extra  dose of torsemide 20 mg this afternoon.  We will check a BMP today.  Agree with her taking torsemide 20 mg daily with an extra 20 mg dose for  weight gain greater than 2 pounds overnight or 5 pounds in 1 week.  Continue carvedilol, losartan, and spironolactone.  Most recent potassium level demonstrated repletion of her hypokalemia.  Check BMP as above.  CHF education discussed including the importance of daily weights, sodium restriction, fluid restriction, and medication compliance.  2. Pulmonary hypertension: Stable.  Remains on sildenafil and torsemide as above.  Followed by pulmonology.  She does have an appointment with the advanced heart failure clinic in [place] on 03/10/2018.  She has been advised to keep this appointment.  3. Chronic respiratory failure with hypoxia: In the setting of her underlying COPD, pulmonary hypertension, and heart failure.  Managed by pulmonology.  Stable.  4. Persistent A. fib: Paced rhythm today noted.  Historically, she does not tolerate A. fib well and this has been felt to be driving some of her heart failure symptoms.  Continue carvedilol, amiodarone, and Eliquis.  Recent normal LFTs and thyroid function.  She will need follow-up LFTs and thyroid function at her next visit.  5. Mitral regurgitation: Stable.  Continue to monitor clinically.  6. Essential hypertension: Blood pressure has been modestly controlled in the 151V to 616 systolic over the most part with an occasional 073-710 systolic.  Increase torsemide as above.  Otherwise, continue carvedilol, losartan, and spironolactone.  7. Hyperlipidemia: Remains on Crestor.  8. Morbid obesity with possible OHS: Likely playing a role in the above.  Weight loss is advised.  9. AKI: Check BMP today.  If renal function continues to trend upwards may need to hold the spironolactone.  Disposition: F/u with Dr. Fletcher Anon or an APP in 3 months.  Current medicines are reviewed at length with the patient today.  The  patient did not have any concerns regarding medicines.  Signed, Christell Faith, PA-C 02/02/2018 3:13 PM     [address] [place], [place] [address] [phone]

## 2018-02-02 NOTE — Patient Instructions (Signed)
Medication Instructions:  Your physician recommends that you continue on your current medications as directed. Please refer to the Current Medication list given to you today.  Take an extra Torsemide when you get home today.   Labwork: Your physician recommends that you return for lab work in: [place].   Testing/Procedures: none  Follow-Up: Your physician recommends that you schedule a follow-up appointment in: [place] APP.  if you need a refill on your cardiac medications before your next appointment, please call your pharmacy.

## 2018-02-03 ENCOUNTER — Telehealth: Payer: Self-pay

## 2018-02-03 ENCOUNTER — Other Ambulatory Visit: Payer: Self-pay | Admitting: Pharmacist

## 2018-02-03 LAB — BASIC METABOLIC PANEL
BUN/Creatinine Ratio: 19 (ref 12–28)
BUN: 20 mg/dL (ref 8–27)
CO2: 27 mmol/L (ref 20–29)
Calcium: 9.6 mg/dL (ref 8.7–10.3)
Chloride: 100 mmol/L (ref 96–106)
Creatinine, Ser: 1.08 mg/dL — ABNORMAL HIGH (ref 0.57–1.00)
GFR calc Af Amer: 58 mL/min/{1.73_m2} — ABNORMAL LOW (ref >59)
GFR calc non Af Amer: 51 mL/min/{1.73_m2} — ABNORMAL LOW (ref >59)
Glucose: 90 mg/dL (ref 65–99)
Potassium: 4 mmol/L (ref 3.5–5.2)
Sodium: 142 mmol/L (ref 134–144)

## 2018-02-03 NOTE — Progress Notes (Deleted)
Patient ID: Debra Saunders, female    DOB: 03-Feb-1946, 72 y.o.   MRN: [id number]  HPI  Ms Garmany is a 72 y/o female with a history of DM, hyperlipidemia, HTN, stroke, thyroid disease, COPD, obstructive sleep apnea, lymphedema, atrial fibrillation, pulmonary HTN, former tobacco use and chronic heart failure.   Echo report from 10/13/17 reviewed and showed an EF of 40-45% along with moderate MR/TR and a severely elevated PA pressure of 65 mm Hg. TEE report from 08/20/17 reviewed and showed an EF of 35%  Was in the ED 11/05/17 due to HF exacerbation along with the chest pain. Was treated and released. Admitted 09/30/17 due to acute on chronic HF. Cardioverted due to atrial fibrillation. Severe pulmonary HTN and acute renal failure. Palliative care, vascular, nephrology and cardiology consults obtained. Recommended referral to [name] in [place]. Discharged after 22 days.   She presents today for a follow-up visit with a chief complaint of .   Past Medical History:  Diagnosis Date  . Arthritis   . Atrial fibrillation, persistent ([place])    a. s/p TEE-guided DCCV on 08/20/2017  . Cardiorenal syndrome    a. 09/2017 Creat rose to 2.7 w/ diuresis-->HD x 2.  . Chest pain    a. 01/2012 Cath: nonobs dzs;  c. 04/2014 MV: No ischemia.  . Chronic back pain   . Chronic combined systolic (congestive) and diastolic (congestive) heart failure ([place])    a. Dx 2005 @ [facility];  b. 02/2014 Echo: Ef 55-60%, no rwma, mild MR, PASP 97mmHg. c. 07/2017: EF 30-35%, diffuse HK, trivial AI, severe MR, moderate TR; d. 09/2017 Echo: EF 40-45%, ant, antsep, ap, basal HK. Mod MR, mod dil LA, mod RV dil w/ mildly reduced fxn, mod dil RA, mod TR, PASP 20mmHg.  [name] Chronic respiratory failure (HCC)    a. on home O2  . COPD (chronic obstructive pulmonary disease) ([place])    a. Home O2 - 3lpm  . Gastritis   . History of intracranial hemorrhage    a. 6/14 described by neurosurgery as "small frontal posttraumatic SAH " - pt was on  xarelto @ the time.  . Hyperlipidemia   . Hypertension   . Lipoma of back   . Lymphedema    a. Chronic LLE edema.  [name] NICM (nonischemic cardiomyopathy) ([place])    a. 09/2017 Echo: EF 40-45%; b. 09/2017 Cath: minor, insignificant CAD.  [name] Obesity   . PAH (pulmonary artery hypertension) ([place])    a. 09/2017 Echo: PASP 35mmHg; b. 09/2017 RHC: RA 23, RV 84/13, PA 84/29, PCWP 20, CO 3.89, CI 1.89. LVEDP 18.  . Sepsis with metabolic encephalopathy ([place])   . Sleep apnea   . Streptococcal bacteremia   . Stroke ([place])   . Symptomatic bradycardia    a. s/p BSX PPM.  . Thyroid nodule   . Type II diabetes mellitus ([place])   . Urinary incontinence    Past Surgical History:  Procedure Laterality Date  . ABDOMINAL HYSTERECTOMY  1969  . CARDIAC CATHETERIZATION  2013   @ [facility]: No obstructive CAD: Only 20% ostial left circumflex.  [name] CARDIOVERSION N/A 10/12/2017   Procedure: CARDIOVERSION;  Surgeon: Minna Merritts, MD;  Location: [facility] ORS;  Service: Cardiovascular;  Laterality: N/A;  . CHOLECYSTECTOMY    . CYST REMOVAL TRUNK  2002   BACK  . DIALYSIS/PERMA CATHETER INSERTION N/A 10/06/2017   Procedure: DIALYSIS/PERMA CATHETER INSERTION;  Surgeon: Katha Cabal, MD;  Location: [place] CV LAB;  Service: Cardiovascular;  Laterality: N/A;  . INSERT / REPLACE / REMOVE PACEMAKER    . lipoma removal  2014   back  . PACEMAKER INSERTION  [address] Scientific dual chamber pacemaker implanted by Dr Caryl Comes for symptomatic bradycardia  . PERMANENT PACEMAKER INSERTION N/A 01/09/2014   Procedure: PERMANENT PACEMAKER INSERTION;  Surgeon: Deboraha Sprang, MD;  Location: [facility] CATH LAB;  Service: Cardiovascular;  Laterality: N/A;  . RIGHT/LEFT HEART CATH AND CORONARY ANGIOGRAPHY N/A 10/19/2017   Procedure: RIGHT/LEFT HEART CATH AND CORONARY ANGIOGRAPHY;  Surgeon: Wellington Hampshire, MD;  Location: [place] CV LAB;  Service: Cardiovascular;  Laterality: N/A;  . TEE WITHOUT CARDIOVERSION N/A 08/20/2017    Procedure: TRANSESOPHAGEAL ECHOCARDIOGRAM (TEE) & Direct current cardioversion;  Surgeon: Minna Merritts, MD;  Location: [facility] ORS;  Service: Cardiovascular;  Laterality: N/A;  . TRIGGER FINGER RELEASE    . VAGINAL HYSTERECTOMY     Family History  Problem Relation Age of Onset  . Heart disease Mother   . Hypertension Mother   . COPD Mother        was a smoker  . Thyroid disease Mother   . Hypertension Father   . Hypertension Sister   . Thyroid disease Sister   . Hypertension Sister   . Thyroid disease Sister   . Breast cancer Maternal Aunt   . Kidney disease Neg Hx   . Bladder Cancer Neg Hx    Social History   Tobacco Use  . Smoking status: Former Smoker    Packs/day: 0.30    Years: 2.00    Pack years: 0.60    Types: Cigarettes    Last attempt to quit: 06/23/1990    Years since quitting: 27.6  . Smokeless tobacco: Never Used  Substance Use Topics  . Alcohol use: No   Allergies  Allergen Reactions  . Aspirin Hives and Other (See Comments)    Pt states that she is unable to take because she had bleeding in her brain.   Pt states that she is unable to take because she had bleeding in her brain. Pt states that she is unable to take because she had bleeding in her brain. Pt states that she is unable to take because she had bleeding in her brain.  . Other Other (See Comments)    Pt states that she is unable to take blood thinners because she had bleeding in her brain.  Blood Thinners-per doctor at [facility] [place] states that she is unable to take blood thinners because she had bleeding in her brain.  Pt states that she is unable to take blood thinners because she had bleeding in her brain.  Blood Thinners-per doctor at [place] states that she is unable to take blood thinners because she had bleeding in her brain.  Blood Thinners-per doctor at [facility]  . Penicillins Hives, Shortness Of Breath, Swelling and Other (See Comments)    Has patient had a PCN reaction  causing immediate rash, facial/tongue/throat swelling, SOB or lightheadedness with hypotension: Yes Has patient had a PCN reaction causing severe rash involving mucus membranes or skin necrosis: No Has patient had a PCN reaction that required hospitalization No Has patient had a PCN reaction occurring within the last 10 years: No If all of the above answers are "NO", then may proceed with Cephalosporin use.     Review of Systems  Constitutional: Positive for appetite change (feel full easy) and fatigue.  HENT: Negative for congestion, facial  swelling and sore throat.   Eyes: Negative.   Respiratory: Positive for cough (mostly at night) and shortness of breath. Negative for chest tightness.   Cardiovascular: Positive for leg swelling. Negative for chest pain and palpitations.  Gastrointestinal: Positive for abdominal distention. Negative for abdominal pain.  Endocrine: Negative.   Genitourinary: Negative.   Skin: Negative.   Allergic/Immunologic: Negative.   Neurological: Negative for dizziness and light-headedness.  Hematological: Negative for adenopathy. Does not bruise/bleed easily.  Psychiatric/Behavioral: Positive for sleep disturbance (wakes up often; wearing oxygen at 4L around the clock along with CPAP). Negative for dysphoric mood. The patient is not nervous/anxious.      Physical Exam  Constitutional: She is oriented to person, place, and time. She appears well-developed and well-nourished.  HENT:  Head: Normocephalic and atraumatic.  Neck: Normal range of motion. Neck supple. No JVD present.  Cardiovascular: Regular rhythm. Bradycardia present.  Pulmonary/Chest: Effort normal. She has no wheezes. She has no rales.  Abdominal: Soft. She exhibits no distension. There is no tenderness.  Musculoskeletal: She exhibits edema (1+ pitting edema in bilateral lower legs). She exhibits no tenderness.  Neurological: She is alert and oriented to person, place, and time.  Skin: Skin is  warm and dry.  Psychiatric: She has a normal mood and affect. Her behavior is normal. Thought content normal.  Nursing note and vitals reviewed.  Assessment & Plan:  1: Chronic heart failure with reduced ejection fraction- - NYHA class III - mildly fluid overloaded today with lower extremity edema - weighing daily and she says that her weight has slowly risen. Instructed her to call for an overnight weight gain of >2 pounds or a weekly weight gain of >5 pounds - weight  - not adding salt and she was reminded to closely follow a 2000mg  sodium diet - drinking 1 cup of coffee and 1-2 bottles of water daily  - taking torsemide 20mg  daily with an additional 20mg  for weight gain. She says that she gained 2 pounds overnight last night and will take 40mg  torsemide today once she returns home - elevate legs when sitting for long periods of time and has been using her compression boots twice daily with good results - saw cardiology ([facility]) 02/02/18 - - BNP on 11/05/17 was 989.0 -   2: HTN- - BP looks good today  - saw PCP [name]) 12/10/17 - BMP on 02/02/18 reviewed and showed sodium 142, potassium 4.0 and GFR 58  3: Diabetes- - fasting glucose at home was  - A1c on 09/30/17 was 5.8%  4: Pulmonary HTN- - wearing oxygen at 3L around the clock - saw pulmonologist Raul Del) 12/15/17 - discussed [facility] with patient and the recommendation for her to go there. She says that transportation to [place] is an issue. Discussed using [facility] since there's not anyone locally that specializes in this. She says that she will think about it and talk with her daughter.   5: Atrial fibrillation- - cardioverted during April admission - currently taking apixaban, digoxin & metoprolol  Patient did not bring her medications nor a list. Each medication was verbally reviewed with the patient and she was encouraged to bring the bottles to every visit to confirm accuracy of list.

## 2018-02-03 NOTE — Patient Outreach (Signed)
[facility]) Care Management  02/03/2018  Debra Saunders [birth year] [id number]   Care Coordination with Dr. [name] (prescriber- [name])   Patient states that her [name] will cost $949. Inquired about samples from Dr. Ellen Henri office. Office to call patient directly.   Cardiology appointment Thurmond Butts [facility], 02/02/18): extra torsemide dose yesterday for edema; Torsemide 20mg  daily with extra dose PRN for weight gain >2 pounds overnight or >5 pounds in 1 week. BMET labs drawn. All other medications continued.   Ruben Reason, PharmD Clinical Pharmacist, [facility] [phone]

## 2018-02-03 NOTE — Telephone Encounter (Signed)
Please advise 

## 2018-02-03 NOTE — Telephone Encounter (Signed)
It is okay to use our samples for the patient.  I believe we only have 2 pens to provide at this time.  Please find out how long it is going to take for them to get her coverage.  We may be able to get additional samples for her.  Thanks.

## 2018-02-03 NOTE — Telephone Encounter (Signed)
Copied from [place] [phone]. Topic: Quick Communication - See Telephone Encounter >> Feb 03, 2018  2:30 PM Antonieta Iba C wrote: CRM for notification. See Telephone encounter for: 02/03/18.  Almyra Free - Pharmacist w/[facility] - [phone]   She is assisting pt w/ Rx. She said that pt's [name] is 102.58 she would like to know if we have samples in the office to give to pt until they work on things with pt's insurance. If office does have samples she would like to know if we could call pt to let her know.   Please assist.

## 2018-02-04 ENCOUNTER — Ambulatory Visit: Payer: Medicare HMO | Admitting: Family

## 2018-02-04 ENCOUNTER — Other Ambulatory Visit: Payer: Self-pay

## 2018-02-04 NOTE — Patient Outreach (Signed)
[facility]) Care Management   02/04/2018  Debra Saunders [birth year] [id number]  Debra Saunders is an 72 y.o. female  Subjective: "I think I am doing OK". States she has not slept well in a couple of nights related to her CPAP mask "leaking". Patient states she went to the cardiologist on Tuesday. "I had a little extra fluid. I took an extra torsemide yesterday and today". "Debra Saunders had told me my goal weight was 210. My weight is coming down but today I still weighed 214.2" "I had to cancel my appointment today with Debra Saunders because I didn't have a ride. My daughter was going to take me but then she couldn't."   Objective:   BP (!) 156/90 (BP Location: Right Arm, Patient Position: Sitting, Cuff Size: Large)   Pulse 61   Resp 19   Wt 214 lb 3.2 oz (97.2 kg)   SpO2 97%   BMI 41.83 kg/m    Physical Exam  Constitutional: She is oriented to person, place, and time. She appears well-developed and well-nourished.  Cardiovascular: Normal rate, regular rhythm and intact distal pulses.  Murmur heard. Respiratory: Effort normal and breath sounds normal. No respiratory distress.  C/O shortness of breath with exertion  GI: Soft. Bowel sounds are normal.  Musculoskeletal: Normal range of motion. She exhibits edema.  2+ chronic edema LLL s/p DVT  Neurological: She is alert and oriented to person, place, and time. She has normal reflexes.  Skin: Skin is warm and dry.  Psychiatric: She has a normal mood and affect. Her behavior is normal. Judgment and thought content normal.    Encounter Medications:   Outpatient Encounter Medications as of 02/04/2018  Medication Sig Note  . acetaminophen (TYLENOL) 325 MG tablet Take 325-650 mg by mouth every 6 (six) hours as needed for mild pain. Reported on 10/10/2015   . albuterol (PROVENTIL) (2.5 MG/3ML) 0.083% nebulizer solution Take 3 mLs (2.5 mg total) by nebulization every 6 (six) hours.   [name] amiodarone (PACERONE) 200 MG tablet  Take 1 tablet (200 mg total) by mouth daily.   [name] apixaban (ELIQUIS) 5 MG TABS tablet Take 1 tablet (5 mg total) by mouth 2 (two) times daily.   . carvedilol (COREG) 3.125 MG tablet Take 1 tablet (3.125 mg total) by mouth 2 (two) times daily with a meal.   . gabapentin (NEURONTIN) 100 MG capsule TAKE 2 CAPSULES BY MOUTH THREE TIMES DAILY   . 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 losartan (COZAAR) 25 MG tablet Take 1 tablet (25 mg total) by mouth daily.   . Menthol, Topical Analgesic, (BIOFREEZE EX) Apply 1 application topically as needed (for pain).    . metFORMIN (GLUCOPHAGE) 500 MG tablet Take 500 mg by mouth 2 (two) times daily with a meal.    . ondansetron (ZOFRAN) 8 MG tablet Take 1 tablet (8 mg total) by mouth every 8 (eight) hours as needed for nausea or vomiting.   . potassium chloride SA (K-DUR,KLOR-CON) 20 MEQ tablet Take 1 tablet (20 mEq total) by mouth 2 (two) times daily.   . rosuvastatin (CRESTOR) 10 MG tablet Take 1 tablet (10 mg total) by mouth daily.   . sildenafil (REVATIO) 20 MG tablet Take 20 mg by mouth 3 (three) times daily.   [name] spironolactone (ALDACTONE) 25 MG tablet Take 0.5 tablets (12.5 mg total) by mouth daily.   [name] torsemide (DEMADEX) 20 MG tablet  Take 1 tablet (20 mg total) by mouth daily. 02/04/2018: Patient takes an additional 69m tablet in the pm if she gains 2 lbs overnight or 5 lbs in 1 week  . [name] 05.68MLE/7.5TZSOPN INJECT 0.75MG SUBCUTANEOUSLY ONCE A WEEK 01/14/2018: Sunday   No facility-administered encounter medications on file as of 02/04/2018.     Functional Status:   In your present state of health, do you have any difficulty performing the following activities: 01/05/2018 11/18/2017  Hearing? N N  Vision? Y Y  Difficulty concentrating or making decisions? Y N  Walking or climbing stairs? Y Y  Dressing or bathing? Y Y  Doing errands, shopping? YTempie Donning Preparing Food and eating ? Y  -  Using the Toilet? N -  In the past six months, have you accidently leaked urine? Y -  Do you have problems with loss of bowel control? Y -  Managing your Medications? Y -  Managing your Finances? Y -  Housekeeping or managing your Housekeeping? Y -  Some recent data might be hidden    Fall/Depression Screening:    Fall Risk  01/05/2018 11/18/2017 09/21/2017  Falls in the past year? No No No  Risk for fall due to : - - -   PHQ 2/9 Scores 12/31/2017 11/18/2017 09/21/2017 07/01/2017 10/21/2016 10/21/2016 05/06/2016  PHQ - 2 Score 0 0 1 0 0 0 0  PHQ- 9 Score - - - 0 5 - -    Assessment: Patient able to ambulate to front door and greet RN CM. Well groomed and pleasant affect. Does not appear to be in any distress. Wears O2 continuously at 4lpm. Physical assessment benign and no evidence of fluid overload. CHF Education reinforcement completed. Patient following care plan established at initial visit. Discussed with patient that she needs to follow the 2 lbs overnight or 5 lbs in one week rule for taking additional torsemide and not attempt to diurese herself daily to get back down to 210 lbs unless instructed by Cardiology. RN CM reviewed cardiology notes from 8/12 and found no evidence of those additional instructions.    Patient expresses concerns with daughters inability to physically help her. Daughter is caring for her sick spouse. Daughter does call patient daily.   Discussed with patient her ability to use [employer] transportation. Patient feels she has to "save" her [name] appointments for [place] Reinforced with patient importance of attending ALL medical appointments. Reinforced with patient that she has to use all [employer] transportation "vouchers" before other options can be explored per CSW. Patient verbalized understanding. [place]oint[place] rescheduled for  8/28.   Patient has called DME company and discussed CPAP issue. Patient will call back to clarify  what she needs to do to resolve issue as she did not fully understand what "paperwork" needs to be completed. Patient instructed to call RN CM if assistance is needed.  Plan: Follow up with patient in 1 month at [facility]  [facility]     Most Recent Value  Care Plan Problem One  HF-High risk for admission  Role Documenting the Problem One  Care Management [place]for Problem One  Active  [facility]al   Patient will not have hospital readmission for HF within the next 31 days  [facility] Long Term Goal Start Date  02/05/18 [Sudie Grumblingrecet]  Interventions for Problem One Long Term Goal  reinforced CHF action plan for fluid overload, reinforced medication adherance and MD appointment adherance  [facility] CM Short Term Goal #1   Patient will weigh daily and record in provided [place] within the next 30 days  [facility] CM Short Term Goal #1 Start Date  01/05/18  [facility] CM Short Term Goal #1 Met Date  02/04/18  [facility] CM Short Term Goal #2   patient will call and make appointment with HF clinic at [facility] within the next 2 weeks  [facility] CM Short Term Goal #2 Start Date  01/05/18  [facility] CM Short Term Goal #2 Met Date  01/18/18  [facility] CM Short Term Goal #3  patient will verbalize decreasing her sodium intake as evidenced by decreasing the amount of take out meals she consumes over the next 30 days  [facility] CM Short Term Goal #3 Start Date  01/05/18  [facility] CM Short Term Goal #3 Met Date  02/04/18  [facility] CM Short Term Goal #4  patient will take all medications as prescribed over the next 30 days  [facility] CM Short Term Goal #4 Start Date  01/05/18  [name] CM Short Term Goal #4 Met Date  02/04/18     Skyler Dusing E. [name] RN, BSN [facility] Care Management Coordinator [phone]

## 2018-02-04 NOTE — Telephone Encounter (Signed)
Almyra Free from [facility] states it could take up to 1 month but she believes it may be sooner. I have notified her that we have 1 sample which has 2 pens, ready for patient to pick up.

## 2018-02-05 ENCOUNTER — Other Ambulatory Visit: Payer: Self-pay | Admitting: Pharmacist

## 2018-02-05 NOTE — Patient Outreach (Signed)
[facility]) Care Management  02/05/2018  UNNAMED HINO April 23, 1946 [id number]  Follow up with patient regarding [name] samples available at Dr. Ellen Henri office. Patient verbalized understanding.   Ruben Reason, PharmD Clinical Pharmacist, [place] [facility] [phone]

## 2018-02-13 DIAGNOSIS — I509 Heart failure, unspecified: Secondary | ICD-10-CM | POA: Diagnosis not present

## 2018-02-17 ENCOUNTER — Ambulatory Visit: Payer: Medicare HMO | Admitting: Family

## 2018-02-18 ENCOUNTER — Other Ambulatory Visit: Payer: Self-pay

## 2018-02-18 MED ORDER — CARVEDILOL 3.125 MG PO TABS: 3.1250 mg | ORAL_TABLET | Freq: Two times a day (BID) | ORAL | 0 refills | Status: DC

## 2018-02-18 NOTE — Telephone Encounter (Signed)
Requested Prescriptions   Signed Prescriptions Disp Refills  . carvedilol (COREG) 3.125 MG tablet 60 tablet 0    Sig: Take 1 tablet (3.125 mg total) by mouth 2 (two) times daily with a meal.    Authorizing Provider: Theora Gianotti    Ordering User: Janan Ridge

## 2018-02-23 ENCOUNTER — Other Ambulatory Visit: Payer: Self-pay

## 2018-02-23 MED ORDER — CARVEDILOL 3.125 MG PO TABS: 3.1250 mg | ORAL_TABLET | Freq: Two times a day (BID) | ORAL | 3 refills | Status: DC

## 2018-02-25 DIAGNOSIS — J9611 Chronic respiratory failure with hypoxia: Secondary | ICD-10-CM | POA: Diagnosis not present

## 2018-03-01 ENCOUNTER — Encounter: Payer: Self-pay | Admitting: Cardiology

## 2018-03-01 ENCOUNTER — Ambulatory Visit (INDEPENDENT_AMBULATORY_CARE_PROVIDER_SITE_OTHER): Payer: Medicare HMO | Admitting: *Deleted

## 2018-03-01 DIAGNOSIS — I495 Sick sinus syndrome: Secondary | ICD-10-CM | POA: Diagnosis not present

## 2018-03-01 NOTE — Progress Notes (Signed)
Remote pacemaker transmission.   

## 2018-03-02 NOTE — Progress Notes (Deleted)
Patient ID: Debra Saunders, female    DOB: Nov 08, 1945, 72 y.o.   MRN: [id number]  HPI  Ms Biller is a 72 y/o female with a history of DM, hyperlipidemia, HTN, stroke, thyroid disease, COPD, obstructive sleep apnea, lymphedema, atrial fibrillation, pulmonary HTN, former tobacco use and chronic heart failure.   Echo report from 10/13/17 reviewed and showed an EF of 40-45% along with moderate MR/TR and a severely elevated PA pressure of 65 mm Hg. TEE report from 08/20/17 reviewed and showed an EF of 35%  Right/left cardiac catheterization done 10/19/17 and showed   Was in the ED 11/05/17 due to HF exacerbation along with the chest pain. Was treated and released. Admitted 09/30/17 due to acute on chronic HF. Cardioverted due to atrial fibrillation. Severe pulmonary HTN and acute renal failure. Palliative care, vascular, nephrology and cardiology consults obtained. Recommended referral to [name] in [place]. Discharged after 22 days.   She presents today for a follow-up visit with a chief complaint of .   Past Medical History:  Diagnosis Date  . Arthritis   . Atrial fibrillation, persistent ([place])    a. s/p TEE-guided DCCV on 08/20/2017  . Cardiorenal syndrome    a. 09/2017 Creat rose to 2.7 w/ diuresis-->HD x 2.  . Chest pain    a. 01/2012 Cath: nonobs dzs;  c. 04/2014 MV: No ischemia.  . Chronic back pain   . Chronic combined systolic (congestive) and diastolic (congestive) heart failure ([place])    a. Dx 2005 @ [facility];  b. 02/2014 Echo: Ef 55-60%, no rwma, mild MR, PASP 44mmHg. c. 07/2017: EF 30-35%, diffuse HK, trivial AI, severe MR, moderate TR; d. 09/2017 Echo: EF 40-45%, ant, antsep, ap, basal HK. Mod MR, mod dil LA, mod RV dil w/ mildly reduced fxn, mod dil RA, mod TR, PASP 49mmHg.  [name] Chronic respiratory failure (HCC)    a. on home O2  . COPD (chronic obstructive pulmonary disease) ([place])    a. Home O2 - 3lpm  . Gastritis   . History of intracranial hemorrhage    a. 6/14 described by  neurosurgery as "small frontal posttraumatic SAH " - pt was on xarelto @ the time.  . Hyperlipidemia   . Hypertension   . Lipoma of back   . Lymphedema    a. Chronic LLE edema.  [name] NICM (nonischemic cardiomyopathy) ([place])    a. 09/2017 Echo: EF 40-45%; b. 09/2017 Cath: minor, insignificant CAD.  [name] Obesity   . PAH (pulmonary artery hypertension) ([place])    a. 09/2017 Echo: PASP 9mmHg; b. 09/2017 RHC: RA 23, RV 84/13, PA 84/29, PCWP 20, CO 3.89, CI 1.89. LVEDP 18.  . Sepsis with metabolic encephalopathy ([place])   . Sleep apnea   . Streptococcal bacteremia   . Stroke ([place])   . Symptomatic bradycardia    a. s/p BSX PPM.  . Thyroid nodule   . Type II diabetes mellitus ([place])   . Urinary incontinence    Past Surgical History:  Procedure Laterality Date  . ABDOMINAL HYSTERECTOMY  1969  . CARDIAC CATHETERIZATION  2013   @ [facility]: No obstructive CAD: Only 20% ostial left circumflex.  [name] CARDIOVERSION N/A 10/12/2017   Procedure: CARDIOVERSION;  Surgeon: Minna Merritts, MD;  Location: [facility] ORS;  Service: Cardiovascular;  Laterality: N/A;  . CHOLECYSTECTOMY    . CYST REMOVAL TRUNK  2002   BACK  . DIALYSIS/PERMA CATHETER INSERTION N/A 10/06/2017   Procedure: DIALYSIS/PERMA CATHETER INSERTION;  Surgeon: [name]  G, MD;  Location: [facility];  Service: Cardiovascular;  Laterality: N/A;  . INSERT / REPLACE / REMOVE PACEMAKER    . lipoma removal  2014   back  . PACEMAKER INSERTION  [address] Scientific dual chamber pacemaker implanted by Dr Caryl Comes for symptomatic bradycardia  . PERMANENT PACEMAKER INSERTION N/A 01/09/2014   Procedure: PERMANENT PACEMAKER INSERTION;  Surgeon: Deboraha Sprang, MD;  Location: [facility] CATH LAB;  Service: Cardiovascular;  Laterality: N/A;  . RIGHT/LEFT HEART CATH AND CORONARY ANGIOGRAPHY N/A 10/19/2017   Procedure: RIGHT/LEFT HEART CATH AND CORONARY ANGIOGRAPHY;  Surgeon: Wellington Hampshire, MD;  Location: [facility];  Service: Cardiovascular;   Laterality: N/A;  . TEE WITHOUT CARDIOVERSION N/A 08/20/2017   Procedure: TRANSESOPHAGEAL ECHOCARDIOGRAM (TEE) & Direct current cardioversion;  Surgeon: Minna Merritts, MD;  Location: [facility] ORS;  Service: Cardiovascular;  Laterality: N/A;  . TRIGGER FINGER RELEASE    . VAGINAL HYSTERECTOMY     Family History  Problem Relation Age of Onset  . Heart disease Mother   . Hypertension Mother   . COPD Mother        was a smoker  . Thyroid disease Mother   . Hypertension Father   . Hypertension Sister   . Thyroid disease Sister   . Hypertension Sister   . Thyroid disease Sister   . Breast cancer Maternal Aunt   . Kidney disease Neg Hx   . Bladder Cancer Neg Hx    Social History   Tobacco Use  . Smoking status: Former Smoker    Packs/day: 0.30    Years: 2.00    Pack years: 0.60    Types: Cigarettes    Last attempt to quit: 06/23/1990    Years since quitting: 27.7  . Smokeless tobacco: Never Used  Substance Use Topics  . Alcohol use: No   Allergies  Allergen Reactions  . Aspirin Hives and Other (See Comments)    Pt states that she is unable to take because she had bleeding in her brain.   Pt states that she is unable to take because she had bleeding in her brain. Pt states that she is unable to take because she had bleeding in her brain. Pt states that she is unable to take because she had bleeding in her brain.  . Other Other (See Comments)    Pt states that she is unable to take blood thinners because she had bleeding in her brain.  Blood Thinners-per doctor at [facility] states that she is unable to take blood thinners because she had bleeding in her brain.  Pt states that she is unable to take blood thinners because she had bleeding in her brain.  Blood Thinners-per doctor at [place] states that she is unable to take blood thinners because she had bleeding in her brain.  Blood Thinners-per doctor at [facility]  . Penicillins Hives, Shortness Of Breath,  Swelling and Other (See Comments)    Has patient had a PCN reaction causing immediate rash, facial/tongue/throat swelling, SOB or lightheadedness with hypotension: Yes Has patient had a PCN reaction causing severe rash involving mucus membranes or skin necrosis: No Has patient had a PCN reaction that required hospitalization No Has patient had a PCN reaction occurring within the last 10 years: No If all of the above answers are "NO", then may proceed with Cephalosporin use.     Review of Systems  Constitutional: Positive for appetite change (feel full  easy) and fatigue.  HENT: Negative for congestion, facial swelling and sore throat.   Eyes: Negative.   Respiratory: Positive for cough (mostly at night) and shortness of breath. Negative for chest tightness.   Cardiovascular: Positive for leg swelling. Negative for chest pain and palpitations.  Gastrointestinal: Positive for abdominal distention. Negative for abdominal pain.  Endocrine: Negative.   Genitourinary: Negative.   Skin: Negative.   Allergic/Immunologic: Negative.   Neurological: Negative for dizziness and light-headedness.  Hematological: Negative for adenopathy. Does not bruise/bleed easily.  Psychiatric/Behavioral: Positive for sleep disturbance (wakes up often; wearing oxygen at 4L around the clock along with CPAP). Negative for dysphoric mood. The patient is not nervous/anxious.      Physical Exam  Constitutional: She is oriented to person, place, and time. She appears well-developed and well-nourished.  HENT:  Head: Normocephalic and atraumatic.  Neck: Normal range of motion. Neck supple. No JVD present.  Cardiovascular: Regular rhythm. Bradycardia present.  Pulmonary/Chest: Effort normal. She has no wheezes. She has no rales.  Abdominal: Soft. She exhibits no distension. There is no tenderness.  Musculoskeletal: She exhibits edema (1+ pitting edema in bilateral lower legs). She exhibits no tenderness.  Neurological:  She is alert and oriented to person, place, and time.  Skin: Skin is warm and dry.  Psychiatric: She has a normal mood and affect. Her behavior is normal. Thought content normal.  Nursing note and vitals reviewed.  Assessment & Plan:  1: Chronic heart failure with reduced ejection fraction- - NYHA class III - mildly fluid overloaded today with lower extremity edema - weighing daily and she says that her weight has slowly risen. Instructed her to call for an overnight weight gain of >2 pounds or a weekly weight gain of >5 pounds - weight  - not adding salt and she was reminded to closely follow a 2000mg  sodium diet - drinking 1 cup of coffee and 1-2 bottles of water daily  - taking torsemide 20mg  daily with an additional 20mg  for weight gain. She says that she gained 2 pounds overnight last night and will take 40mg  torsemide today once she returns home - elevate legs when sitting for long periods of time and has been using her compression boots twice daily with good results - saw cardiology ([facility]) 02/02/18 - - BNP on 11/05/17 was 989.0 -   2: HTN- - BP looks good today  - saw PCP [name]) 12/10/17 - BMP on 02/02/18 reviewed and showed sodium 142, potassium 4.0 and GFR 58  3: Diabetes- - fasting glucose at home was  - A1c on 09/30/17 was 5.8%  4: Pulmonary HTN- - wearing oxygen at 3L around the clock - saw pulmonologist Raul Del) 12/15/17 - discussed [facility] with patient and the recommendation for her to go there. She says that transportation to [place] is an issue. Discussed using [facility] since there's not anyone locally that specializes in this. She says that she will think about it and talk with her daughter.   5: Atrial fibrillation- - cardioverted during April admission - currently taking apixaban, digoxin & metoprolol  Patient did not bring her medications nor a list. Each medication was verbally reviewed with the patient and she was encouraged to bring the bottles to every  visit to confirm accuracy of list.

## 2018-03-03 ENCOUNTER — Telehealth (HOSPITAL_COMMUNITY): Payer: Self-pay | Admitting: Cardiology

## 2018-03-03 ENCOUNTER — Ambulatory Visit: Payer: Medicare HMO | Admitting: Family

## 2018-03-03 NOTE — Telephone Encounter (Signed)
Voicemail left by [facility] requesting chf clinic to contact patient. Patient is scheduled for a new patient appt and is unsure where to get dropped off/.  Detailed instructions given to patient about drop off entrance. Next to ER/ door under green awning/ [place] side. Patient voiced understanding

## 2018-03-04 ENCOUNTER — Other Ambulatory Visit: Payer: Self-pay | Admitting: Pharmacist

## 2018-03-04 NOTE — Patient Outreach (Signed)
[name]) Care Management  03/04/2018  OLUWASEYI TULL 21-Sep-1945 [id number]  72 y.o. year old female referred to [place] services for Medication Assistance  Patient with [facility] Advantage plan.   Patient confirms identity with HIPAA-identifiers x2 and gives verbal consent to speak over the phone about medications.   Medication Assistance:  Patient consented to using her email address on file for [place] to open a [employer] patient portal for her. We need this to obtain her TROOP as she states she doesn't get the print out in the mail or has lost this printout. We will proceed with her MAP applications.   Patient also had questions about how to get to her appointment with Dr. Jeffie Pollock next week. She is using her [employer] ride benefit. Contacted the advanced heart failure clinic so they could provide patient with directions.    Follow up as facilitated by Etter Sjogren, [facility] CPhT.   Ruben Reason, PharmD Clinical Pharmacist, [facility] [phone]

## 2018-03-08 ENCOUNTER — Inpatient Hospital Stay
Admission: EM | Admit: 2018-03-08 | Discharge: 2018-03-12 | DRG: 291 | Disposition: A | Discharge status: Home Health | Payer: Medicare HMO | Attending: Internal Medicine | Admitting: Internal Medicine

## 2018-03-08 ENCOUNTER — Emergency Department: Payer: Medicare HMO

## 2018-03-08 ENCOUNTER — Encounter: Payer: Self-pay | Admitting: Emergency Medicine

## 2018-03-08 DIAGNOSIS — J9600 Acute respiratory failure, unspecified whether with hypoxia or hypercapnia: Secondary | ICD-10-CM | POA: Diagnosis present

## 2018-03-08 DIAGNOSIS — G4733 Obstructive sleep apnea (adult) (pediatric): Secondary | ICD-10-CM | POA: Diagnosis present

## 2018-03-08 DIAGNOSIS — J810 Acute pulmonary edema: Secondary | ICD-10-CM | POA: Diagnosis not present

## 2018-03-08 DIAGNOSIS — I480 Paroxysmal atrial fibrillation: Secondary | ICD-10-CM | POA: Diagnosis not present

## 2018-03-08 DIAGNOSIS — Z87891 Personal history of nicotine dependence: Secondary | ICD-10-CM

## 2018-03-08 DIAGNOSIS — I2510 Atherosclerotic heart disease of native coronary artery without angina pectoris: Secondary | ICD-10-CM | POA: Diagnosis present

## 2018-03-08 DIAGNOSIS — J811 Chronic pulmonary edema: Secondary | ICD-10-CM | POA: Diagnosis not present

## 2018-03-08 DIAGNOSIS — Z79899 Other long term (current) drug therapy: Secondary | ICD-10-CM

## 2018-03-08 DIAGNOSIS — Z8673 Personal history of transient ischemic attack (TIA), and cerebral infarction without residual deficits: Secondary | ICD-10-CM | POA: Diagnosis not present

## 2018-03-08 DIAGNOSIS — I10 Essential (primary) hypertension: Secondary | ICD-10-CM | POA: Diagnosis not present

## 2018-03-08 DIAGNOSIS — Z888 Allergy status to other drugs, medicaments and biological substances status: Secondary | ICD-10-CM | POA: Diagnosis not present

## 2018-03-08 DIAGNOSIS — Z7901 Long term (current) use of anticoagulants: Secondary | ICD-10-CM | POA: Diagnosis not present

## 2018-03-08 DIAGNOSIS — R0789 Other chest pain: Secondary | ICD-10-CM | POA: Diagnosis not present

## 2018-03-08 DIAGNOSIS — J9611 Chronic respiratory failure with hypoxia: Secondary | ICD-10-CM

## 2018-03-08 DIAGNOSIS — I481 Persistent atrial fibrillation: Secondary | ICD-10-CM | POA: Diagnosis present

## 2018-03-08 DIAGNOSIS — M940 Chondrocostal junction syndrome [Tietze]: Secondary | ICD-10-CM | POA: Diagnosis present

## 2018-03-08 DIAGNOSIS — I248 Other forms of acute ischemic heart disease: Secondary | ICD-10-CM | POA: Diagnosis not present

## 2018-03-08 DIAGNOSIS — I2721 Secondary pulmonary arterial hypertension: Secondary | ICD-10-CM | POA: Diagnosis present

## 2018-03-08 DIAGNOSIS — Z950 Presence of cardiac pacemaker: Secondary | ICD-10-CM | POA: Diagnosis not present

## 2018-03-08 DIAGNOSIS — Z7951 Long term (current) use of inhaled steroids: Secondary | ICD-10-CM

## 2018-03-08 DIAGNOSIS — I340 Nonrheumatic mitral (valve) insufficiency: Secondary | ICD-10-CM | POA: Diagnosis present

## 2018-03-08 DIAGNOSIS — Z6839 Body mass index (BMI) 39.0-39.9, adult: Secondary | ICD-10-CM

## 2018-03-08 DIAGNOSIS — J449 Chronic obstructive pulmonary disease, unspecified: Secondary | ICD-10-CM | POA: Diagnosis not present

## 2018-03-08 DIAGNOSIS — R0602 Shortness of breath: Secondary | ICD-10-CM

## 2018-03-08 DIAGNOSIS — Z880 Allergy status to penicillin: Secondary | ICD-10-CM | POA: Diagnosis not present

## 2018-03-08 DIAGNOSIS — Z886 Allergy status to analgesic agent status: Secondary | ICD-10-CM

## 2018-03-08 DIAGNOSIS — E669 Obesity, unspecified: Secondary | ICD-10-CM | POA: Diagnosis not present

## 2018-03-08 DIAGNOSIS — I509 Heart failure, unspecified: Secondary | ICD-10-CM | POA: Diagnosis not present

## 2018-03-08 DIAGNOSIS — E876 Hypokalemia: Secondary | ICD-10-CM | POA: Diagnosis not present

## 2018-03-08 DIAGNOSIS — I5033 Acute on chronic diastolic (congestive) heart failure: Secondary | ICD-10-CM | POA: Diagnosis not present

## 2018-03-08 DIAGNOSIS — I272 Pulmonary hypertension, unspecified: Secondary | ICD-10-CM | POA: Diagnosis not present

## 2018-03-08 DIAGNOSIS — E785 Hyperlipidemia, unspecified: Secondary | ICD-10-CM | POA: Diagnosis present

## 2018-03-08 DIAGNOSIS — I5043 Acute on chronic combined systolic (congestive) and diastolic (congestive) heart failure: Secondary | ICD-10-CM | POA: Diagnosis present

## 2018-03-08 DIAGNOSIS — I110 Hypertensive heart disease with heart failure: Principal | ICD-10-CM | POA: Diagnosis present

## 2018-03-08 DIAGNOSIS — J9621 Acute and chronic respiratory failure with hypoxia: Secondary | ICD-10-CM | POA: Diagnosis present

## 2018-03-08 DIAGNOSIS — Z9114 Patient's other noncompliance with medication regimen: Secondary | ICD-10-CM

## 2018-03-08 DIAGNOSIS — Z7984 Long term (current) use of oral hypoglycemic drugs: Secondary | ICD-10-CM

## 2018-03-08 DIAGNOSIS — Z9981 Dependence on supplemental oxygen: Secondary | ICD-10-CM | POA: Diagnosis not present

## 2018-03-08 DIAGNOSIS — I119 Hypertensive heart disease without heart failure: Secondary | ICD-10-CM | POA: Diagnosis present

## 2018-03-08 DIAGNOSIS — I428 Other cardiomyopathies: Secondary | ICD-10-CM | POA: Diagnosis not present

## 2018-03-08 LAB — BASIC METABOLIC PANEL
Anion gap: 6 (ref 5–15)
BUN: 18 mg/dL (ref 8–23)
CO2: 27 mmol/L (ref 22–32)
Calcium: 9 mg/dL (ref 8.9–10.3)
Chloride: 105 mmol/L (ref 98–111)
Creatinine, Ser: 0.93 mg/dL (ref 0.44–1.00)
GFR calc Af Amer: >60 mL/min (ref >60)
GFR calc non Af Amer: 59 mL/min — ABNORMAL LOW (ref >60)
Glucose, Bld: 120 mg/dL — ABNORMAL HIGH (ref 70–99)
Potassium: 3.8 mmol/L (ref 3.5–5.1)
Sodium: 138 mmol/L (ref 135–145)

## 2018-03-08 LAB — CBC
HCT: 36.5 % (ref 35.0–47.0)
Hemoglobin: 11.7 g/dL — ABNORMAL LOW (ref 12.0–16.0)
MCH: 27.1 pg (ref 26.0–34.0)
MCHC: 32 g/dL (ref 32.0–36.0)
MCV: 84.6 fL (ref 80.0–100.0)
Platelets: 202 10*3/uL (ref 150–440)
RBC: 4.32 MIL/uL (ref 3.80–5.20)
RDW: 14.8 % — ABNORMAL HIGH (ref 11.5–14.5)
WBC: 6.1 10*3/uL (ref 3.6–11.0)

## 2018-03-08 LAB — BLOOD GAS, VENOUS
Acid-Base Excess: 4.3 mmol/L — ABNORMAL HIGH (ref 0.0–2.0)
Bicarbonate: 29.2 mmol/L — ABNORMAL HIGH (ref 20.0–28.0)
O2 Saturation: 97.2 %
Patient temperature: 37
pCO2, Ven: 44 mmHg (ref 44.0–60.0)
pH, Ven: 7.43 (ref 7.250–7.430)
pO2, Ven: 90 mmHg — ABNORMAL HIGH (ref 32.0–45.0)

## 2018-03-08 LAB — TROPONIN I: Troponin I: 0.03 ng/mL (ref <0.03)

## 2018-03-08 MED ORDER — FUROSEMIDE 10 MG/ML IJ SOLN
60.0000 mg | Freq: Once | INTRAMUSCULAR | Status: AC
  Administered 2018-03-08: 60 mg via INTRAVENOUS
  Filled 2018-03-08: qty 8

## 2018-03-08 MED ORDER — ACETAMINOPHEN 500 MG PO TABS
1000.0000 mg | ORAL_TABLET | Freq: Once | ORAL | Status: AC
  Administered 2018-03-08: 1000 mg via ORAL
  Filled 2018-03-08: qty 2

## 2018-03-08 NOTE — ED Triage Notes (Signed)
Pt arrived via EMS from home with c/o SOB x3 days with increasing labored breathing tonight without relief from home nebulizer treatments. Pt is A&O x4. Pt has hx/o copd and wears 4L of O2 at all times. Pt is 94 on 4L on arrival.

## 2018-03-08 NOTE — H&P (Signed)
Debra Saunders at [place] NAME: Debra Saunders    MR#:  [id number]  DATE OF BIRTH:  Apr 07, 1946  DATE OF ADMISSION:  03/08/2018  PRIMARY CARE PHYSICIAN: [name], MD   REQUESTING/REFERRING PHYSICIAN:   CHIEF COMPLAINT:   Chief Complaint  Patient presents with  . Shortness of Breath    HISTORY OF PRESENT ILLNESS: Debra Saunders  is a 72 y.o. female with a known history of hypertension, hyperlipidemia, coronary artery disease, CHF, DM2,  OSA, COPD, on chronic 3 L oxygen per nasal cannula. Patient presented to emergency room for acute onset of shortness of breath going on for the past 2 to 3 days, gradually getting worse, despite using nebulizer treatment at home.  Occasional dry cough is associated with her symptoms, no sputum, no hemoptysis. Patient denies any chest pain or palpitations, no fever or chills.  She states she is compliant with her medications. At the arrival to emergency room, patient was noted with hypoxia, her oxygen saturation was 94% on 4 L nasal cannula. Blood test in the emergency room are notable for troponin level is 0.03. EKG, reviewed by myself, shows normal sinus rhythm with heart rate at 68 bpm, no acute ischemic changes. Chest x-ray shows cardiomegaly with vascular congestion and moderate diffuse interstitial and ground-glass opacity suspicious for pulmonary edema. Patient is admitted for further evaluation and treatment.  PAST MEDICAL HISTORY:   Past Medical History:  Diagnosis Date  . Arthritis   . Atrial fibrillation, persistent (Debra Saunders)    a. s/p TEE-guided DCCV on 08/20/2017  . Cardiorenal syndrome    a. 09/2017 Creat rose to 2.7 w/ diuresis-->HD x 2.  . Chest pain    a. 01/2012 Cath: nonobs dzs;  c. 04/2014 MV: No ischemia.  . Chronic back pain   . Chronic combined systolic (congestive) and diastolic (congestive) heart failure (Debra Saunders)    a. Dx 2005 @ [facility];  b. 02/2014 Echo: Ef 55-60%, no rwma, mild MR,  PASP 57mmHg. c. 07/2017: EF 30-35%, diffuse HK, trivial AI, severe MR, moderate TR; d. 09/2017 Echo: EF 40-45%, ant, antsep, ap, basal HK. Mod MR, mod dil LA, mod RV dil w/ mildly reduced fxn, mod dil RA, mod TR, PASP 46mmHg.  [name] Chronic respiratory failure (HCC)    a. on home O2  . COPD (chronic obstructive pulmonary disease) (Debra Saunders)    a. Home O2 - 3lpm  . Gastritis   . History of intracranial hemorrhage    a. 6/14 described by neurosurgery as "small frontal posttraumatic SAH " - pt was on xarelto @ the time.  . Hyperlipidemia   . Hypertension   . Lipoma of back   . Lymphedema    a. Chronic LLE edema.  [name] NICM (nonischemic cardiomyopathy) ([place])    a. 09/2017 Echo: EF 40-45%; b. 09/2017 Cath: minor, insignificant CAD.  [name] Obesity   . PAH (pulmonary artery hypertension) ([place])    a. 09/2017 Echo: PASP 4mmHg; b. 09/2017 RHC: RA 23, RV 84/13, PA 84/29, PCWP 20, CO 3.89, CI 1.89. LVEDP 18.  . Sepsis with metabolic encephalopathy (Debra Saunders)   . Sleep apnea   . Streptococcal bacteremia   . Stroke (Debra Saunders)   . Symptomatic bradycardia    a. s/p BSX PPM.  . Thyroid nodule   . Type II diabetes mellitus (Debra Saunders)   . Urinary incontinence     PAST SURGICAL HISTORY:  Past Surgical History:  Procedure Laterality Date  . ABDOMINAL HYSTERECTOMY  1969  .  CARDIAC CATHETERIZATION  2013   @ [place]: No obstructive CAD: Only 20% ostial left circumflex.  [name] CARDIOVERSION N/A 10/12/2017   Procedure: CARDIOVERSION;  Surgeon: Minna Merritts, MD;  Location: [facility] ORS;  Service: Cardiovascular;  Laterality: N/A;  . CHOLECYSTECTOMY    . CYST REMOVAL TRUNK  2002   BACK  . DIALYSIS/PERMA CATHETER INSERTION N/A 10/06/2017   Procedure: DIALYSIS/PERMA CATHETER INSERTION;  Surgeon: Katha Cabal, MD;  Location: [place] CV LAB;  Service: Cardiovascular;  Laterality: N/A;  . INSERT / REPLACE / REMOVE PACEMAKER    . lipoma removal  2014   back  . PACEMAKER INSERTION  [address] Scientific dual chamber pacemaker  implanted by Dr Caryl Comes for symptomatic bradycardia  . PERMANENT PACEMAKER INSERTION N/A 01/09/2014   Procedure: PERMANENT PACEMAKER INSERTION;  Surgeon: Deboraha Sprang, MD;  Location: [facility] CATH LAB;  Service: Cardiovascular;  Laterality: N/A;  . RIGHT/LEFT HEART CATH AND CORONARY ANGIOGRAPHY N/A 10/19/2017   Procedure: RIGHT/LEFT HEART CATH AND CORONARY ANGIOGRAPHY;  Surgeon: Wellington Hampshire, MD;  Location: [place] CV LAB;  Service: Cardiovascular;  Laterality: N/A;  . TEE WITHOUT CARDIOVERSION N/A 08/20/2017   Procedure: TRANSESOPHAGEAL ECHOCARDIOGRAM (TEE) & Direct current cardioversion;  Surgeon: Minna Merritts, MD;  Location: [facility] ORS;  Service: Cardiovascular;  Laterality: N/A;  . TRIGGER FINGER RELEASE    . VAGINAL HYSTERECTOMY      SOCIAL HISTORY:  Social History   Tobacco Use  . Smoking status: Former Smoker    Packs/day: 0.30    Years: 2.00    Pack years: 0.60    Types: Cigarettes    Last attempt to quit: 06/23/1990    Years since quitting: 27.7  . Smokeless tobacco: Never Used  Substance Use Topics  . Alcohol use: No    FAMILY HISTORY:  Family History  Problem Relation Age of Onset  . Heart disease Mother   . Hypertension Mother   . COPD Mother        was a smoker  . Thyroid disease Mother   . Hypertension Father   . Hypertension Sister   . Thyroid disease Sister   . Hypertension Sister   . Thyroid disease Sister   . Breast cancer Maternal Aunt   . Kidney disease Neg Hx   . Bladder Cancer Neg Hx     DRUG ALLERGIES:  Allergies  Allergen Reactions  . Aspirin Hives and Other (See Comments)    Pt states that she is unable to take because she had bleeding in her brain.   Pt states that she is unable to take because she had bleeding in her brain. Pt states that she is unable to take because she had bleeding in her brain. Pt states that she is unable to take because she had bleeding in her brain.  . Other Other (See Comments)    Pt states that she is  unable to take blood thinners because she had bleeding in her brain.  Blood Thinners-per doctor at [place] states that she is unable to take blood thinners because she had bleeding in her brain.  Pt states that she is unable to take blood thinners because she had bleeding in her brain.  Blood Thinners-per doctor at [place] states that she is unable to take blood thinners because she had bleeding in her brain.  Blood Thinners-per doctor at [facility]  . Penicillins Hives, Shortness Of Breath, Swelling and Other (See Comments)  Has patient had a PCN reaction causing immediate rash, facial/tongue/throat swelling, SOB or lightheadedness with hypotension: Yes Has patient had a PCN reaction causing severe rash involving mucus membranes or skin necrosis: No Has patient had a PCN reaction that required hospitalization No Has patient had a PCN reaction occurring within the last 10 years: No If all of the above answers are "NO", then may proceed with Cephalosporin use.    REVIEW OF SYSTEMS:   CONSTITUTIONAL: No fever, fatigue or weakness.  EYES: No changes in vision.  EARS, NOSE, AND THROAT: No tinnitus or ear pain.  RESPIRATORY: Positive for dry cough, shortness of breath, no wheezing or hemoptysis.  CARDIOVASCULAR: No chest pain, orthopnea, edema.  GASTROINTESTINAL: No nausea, vomiting, diarrhea or abdominal pain.  GENITOURINARY: No dysuria, hematuria.  ENDOCRINE: No polyuria, nocturia. HEMATOLOGY: No bleeding. SKIN: No rash or lesion. MUSCULOSKELETAL: No joint pain at this time.   NEUROLOGIC: No focal weakness.  PSYCHIATRY: No anxiety or depression.   MEDICATIONS AT HOME:  Prior to Admission medications   Medication Sig Start Date End Date Taking? Authorizing Provider  acetaminophen (TYLENOL) 325 MG tablet Take 325-650 mg by mouth every 6 (six) hours as needed for mild pain. Reported on 10/10/2015   Yes [provider]  albuterol (PROVENTIL) (2.5 MG/3ML) 0.083%  nebulizer solution Take 3 mLs (2.5 mg total) by nebulization every 6 (six) hours. 08/28/17  Yes Wieting, Richard, MD  amiodarone (PACERONE) 200 MG tablet Take 1 tablet (200 mg total) by mouth daily. 01/15/18  Yes Theora Gianotti, NP  apixaban (ELIQUIS) 5 MG TABS tablet Take 1 tablet (5 mg total) by mouth 2 (two) times daily. 11/09/17  Yes Wellington Hampshire, MD  carvedilol (COREG) 3.125 MG tablet Take 1 tablet (3.125 mg total) by mouth 2 (two) times daily with a meal. 02/23/18  Yes Theora Gianotti, NP  gabapentin (NEURONTIN) 100 MG capsule TAKE 2 CAPSULES BY MOUTH THREE TIMES DAILY 08/12/17  Yes [name], MD  glucose blood (TRUE METRIX BLOOD GLUCOSE TEST) test strip Check blood glucose twice daily Dx:E11.8, Z79.4 09/17/17  Yes [name], MD  losartan (COZAAR) 25 MG tablet Take 1 tablet (25 mg total) by mouth daily. 01/15/18  Yes Theora Gianotti, NP  Menthol, Topical Analgesic, (BIOFREEZE EX) Apply 1 application topically as needed (for pain).    Yes [provider]  metFORMIN (GLUCOPHAGE) 500 MG tablet Take 500 mg by mouth 2 (two) times daily with a meal.    Yes [provider]  potassium chloride SA (K-DUR,KLOR-CON) 20 MEQ tablet Take 1 tablet (20 mEq total) by mouth 2 (two) times daily. 12/04/17  Yes Theora Gianotti, NP  rosuvastatin (CRESTOR) 10 MG tablet Take 1 tablet (10 mg total) by mouth daily. 11/30/17  Yes [name], MD  sildenafil (REVATIO) 20 MG tablet Take 20 mg by mouth 3 (three) times daily. 05/20/17  Yes [provider]  spironolactone (ALDACTONE) 25 MG tablet Take 0.5 tablets (12.5 mg total) by mouth daily. 01/07/18 04/07/18 Yes Theora Gianotti, NP  torsemide (DEMADEX) 20 MG tablet Take 1 tablet (20 mg total) by mouth daily. 01/15/18 03/08/18 Yes Theora Gianotti, NP  [name] 5.80 DX/8.3JA SOPN INJECT 0.75MG  SUBCUTANEOUSLY ONCE A WEEK 12/22/17  Yes [name], MD  ondansetron  (ZOFRAN) 8 MG tablet Take 1 tablet (8 mg total) by mouth every 8 (eight) hours as needed for nausea or vomiting. 09/25/14   Jackolyn Confer, MD  PHYSICAL EXAMINATION:   VITAL SIGNS: Blood pressure (!) 151/84, pulse (!) 59, temperature 98.3 F (36.8 C), temperature source Oral, resp. rate 20, SpO2 98 %.  GENERAL:  72 y.o.-year-old patient lying in the bed with mild respiratory distress.  EYES: Pupils equal, round, reactive to light and accommodation. No scleral icterus. Extraocular muscles intact.  HEENT: Head atraumatic, normocephalic. Oropharynx and nasopharynx clear.  NECK:  Supple, no jugular venous distention. No thyroid enlargement, no tenderness.  LUNGS: Reduced breath sounds bilaterally, no wheezing, rales,rhonchi or crepitation. No use of accessory muscles of respiration.  CARDIOVASCULAR: S1, S2 normal. No S3/S4.  ABDOMEN: Soft, nontender, nondistended. Bowel sounds present. No organomegaly or mass.  EXTREMITIES: No pedal edema, cyanosis, or clubbing.  NEUROLOGIC: No focal weakness. PSYCHIATRIC: The patient is alert and oriented x 3.  SKIN: No obvious rash, lesion, or ulcer.   LABORATORY PANEL:   CBC Recent Labs  Lab 03/08/18 2148  WBC 6.1  HGB 11.7*  HCT 36.5  PLT 202  MCV 84.6  MCH 27.1  MCHC 32.0  RDW 14.8*   ------------------------------------------------------------------------------------------------------------------  Chemistries  Recent Labs  Lab 03/08/18 2148  NA 138  K 3.8  CL 105  CO2 27  GLUCOSE 120*  BUN 18  CREATININE 0.93  CALCIUM 9.0   ------------------------------------------------------------------------------------------------------------------ CrCl cannot be calculated (Unknown ideal weight.). ------------------------------------------------------------------------------------------------------------------ No results for input(s): TSH, T4TOTAL, T3FREE, THYROIDAB in the last 72 hours.  Invalid input(s): FREET3   Coagulation  profile No results for input(s): INR, PROTIME in the last 168 hours. ------------------------------------------------------------------------------------------------------------------- No results for input(s): DDIMER in the last 72 hours. -------------------------------------------------------------------------------------------------------------------  Cardiac Enzymes Recent Labs  Lab 03/08/18 2148  TROPONINI 0.03*   ------------------------------------------------------------------------------------------------------------------ Invalid input(s): POCBNP  ---------------------------------------------------------------------------------------------------------------  Urinalysis    Component Value Date/Time   COLORURINE YELLOW (A) 10/19/2017 0124   APPEARANCEUR CLEAR (A) 10/19/2017 0124   APPEARANCEUR Clear 11/18/2013 0443   LABSPEC 1.013 10/19/2017 0124   LABSPEC 1.049 11/18/2013 0443   PHURINE 6.0 10/19/2017 0124   GLUCOSEU NEGATIVE 10/19/2017 0124   GLUCOSEU Negative 11/18/2013 0443   HGBUR NEGATIVE 10/19/2017 0124   BILIRUBINUR NEGATIVE 10/19/2017 0124   BILIRUBINUR small 03/10/2016 1431   BILIRUBINUR Negative 11/18/2013 0443   KETONESUR NEGATIVE 10/19/2017 0124   PROTEINUR NEGATIVE 10/19/2017 0124   UROBILINOGEN 1.0 03/10/2016 1431   NITRITE NEGATIVE 10/19/2017 0124   LEUKOCYTESUR NEGATIVE 10/19/2017 0124   LEUKOCYTESUR Negative 11/18/2013 0443     RADIOLOGY: Dg Chest Portable 1 View  Result Date: 03/08/2018 CLINICAL DATA:  Short of breath EXAM: PORTABLE CHEST 1 VIEW COMPARISON:  11/05/2016, 10/12/2017, 10/08/2017 FINDINGS: Left-sided pacing device as before. Cardiomegaly with vascular congestion and moderate interstitial and ground-glass opacities suspicious for pulmonary edema. No large effusion. Aortic atherosclerosis. IMPRESSION: Cardiomegaly with vascular congestion and moderate diffuse interstitial and ground-glass opacity suspicious for pulmonary edema  Electronically Signed   By: Donavan Foil M.D.   On: 03/08/2018 22:17    EKG: Orders placed or performed during the hospital encounter of 03/08/18  . ED EKG within 10 minutes  . ED EKG within 10 minutes  . EKG 12-Lead  . EKG 12-Lead    IMPRESSION AND PLAN:  1.  Acute on chronic respiratory failure with hypoxia, likely secondary to acute pulmonary edema.  We will start treatment with IV Lasix.  Continue other maintenance therapy for CHF.  Will check 2D echo for further evaluation of her cardiac function. 2.  Acute pulmonary edema, see treatment as above, under #1. 3.  COPD, without acute  exacerbation, continue maintenance therapy. 4.  OSA, continue CPAP nightly. 5.  DM2.  Will monitor blood sugars before meals and at bedtime and use insulin treatment during the hospital stay. 6.  Hypertension, stable, continue home medications.  All the records are reviewed and case discussed with ED provider. Management plans discussed with the patient, who is in agreement.  CODE STATUS: Full Code Status History    Date Active Date Inactive Code Status Order ID Comments User Context   09/30/2017 1901 10/22/2017 2001 Full Code [id number]  Gorden Harms, MD Inpatient   08/16/2017 1614 08/28/2017 2239 Full Code [id number]  Gorden Harms, MD Inpatient   03/30/2015 2152 04/03/2015 2122 Full Code [id number]  Etta Quill, DO Inpatient   03/27/2015 2208 03/28/2015 1737 Full Code [id number]  Nicholes Mango, MD ED   01/09/2014 1418 01/10/2014 1803 Full Code [id number]  Deboraha Sprang, MD Inpatient   01/16/2013 2335 01/27/2013 2011 Full Code [id number]  Collene Gobble, MD Inpatient       TOTAL TIME TAKING CARE OF THIS PATIENT: 50 minutes.    Amelia Jo M.D on 03/08/2018 at 11:59 PM  Between 7am to 6pm - Pager - [phone]  After 6pm go to [email or web address] - password [email or web address] [facility]  [phone]  CC: Primary care physician; [name],  MD

## 2018-03-08 NOTE — ED Provider Notes (Addendum)
[facility] Emergency Department Provider Note  Time seen: 10:25 PM  I have reviewed the triage vital signs and the nursing notes.   HISTORY  Chief Complaint Shortness of Breath    HPI Debra Saunders is a 72 y.o. female with a past medical history of CAD, chronic back pain, CHF, COPD, hypertension, hyperlipidemia, presents to the emergency department for shortness of breath.  According to the patient for the past 2 to 3 days she has been feeling progressively worsening shortness of breath.  States a slight cough but denies any sputum or mucus production.  Denies any chest pain.  Denies any fever.  Patient has left lower extremity swelling which is chronic times years per the patient, no pain or tenderness.   Past Medical History:  Diagnosis Date  . Arthritis   . Atrial fibrillation, persistent ([place])    a. s/p TEE-guided DCCV on 08/20/2017  . Cardiorenal syndrome    a. 09/2017 Creat rose to 2.7 w/ diuresis-->HD x 2.  . Chest pain    a. 01/2012 Cath: nonobs dzs;  c. 04/2014 MV: No ischemia.  . Chronic back pain   . Chronic combined systolic (congestive) and diastolic (congestive) heart failure ([place])    a. Dx 2005 @ [facility];  b. 02/2014 Echo: Ef 55-60%, no rwma, mild MR, PASP 67mmHg. c. 07/2017: EF 30-35%, diffuse HK, trivial AI, severe MR, moderate TR; d. 09/2017 Echo: EF 40-45%, ant, antsep, ap, basal HK. Mod MR, mod dil LA, mod RV dil w/ mildly reduced fxn, mod dil RA, mod TR, PASP 50mmHg.  [name] Chronic respiratory failure (HCC)    a. on home O2  . COPD (chronic obstructive pulmonary disease) ([place])    a. Home O2 - 3lpm  . Gastritis   . History of intracranial hemorrhage    a. 6/14 described by neurosurgery as "small frontal posttraumatic SAH " - pt was on xarelto @ the time.  . Hyperlipidemia   . Hypertension   . Lipoma of back   . Lymphedema    a. Chronic LLE edema.  [name] NICM (nonischemic cardiomyopathy) ([place])    a. 09/2017 Echo: EF 40-45%; b. 09/2017 Cath: minor,  insignificant CAD.  [name] Obesity   . PAH (pulmonary artery hypertension) ([place])    a. 09/2017 Echo: PASP 55mmHg; b. 09/2017 RHC: RA 23, RV 84/13, PA 84/29, PCWP 20, CO 3.89, CI 1.89. LVEDP 18.  . Sepsis with metabolic encephalopathy ([place])   . Sleep apnea   . Streptococcal bacteremia   . Stroke ([place])   . Symptomatic bradycardia    a. s/p BSX PPM.  . Thyroid nodule   . Type II diabetes mellitus ([place])   . Urinary incontinence     Patient Active Problem List   Diagnosis Date Noted  . Acute renal failure ([place])   . ESRD (end stage renal disease) on dialysis ([place])   . Shortness of breath   . Atrial fibrillation ([place]) 09/22/2017  . Acute bilateral low back pain without sciatica 01/28/2017  . Pain in limb 07/22/2016  . Neuropathy 06/09/2016  . Pain in the chest 03/10/2016  . Chronic deep vein thrombosis (DVT) (HCC) 03/03/2016  . Chronic back pain 10/19/2015  . Goiter 04/25/2015  . Cervical disc disorder with radiculopathy of cervical region 03/30/2015  . Elevated LFTs 06/29/2014  . 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 Abnormality of gait 01/13/2014  . Sinoatrial node dysfunction ([place]) 01/09/2014  . Allergic rhinitis 10/26/2013  . Lymphedema 08/03/2013  .  Constipation 04/12/2013  . Pulmonary embolus ([place]) 01/19/2013  . Subarachnoid hemorrhage ([place]) 01/19/2013  . Musculoskeletal chest pain 01/19/2013  . Preop cardiovascular exam 10/19/2012  . Anemia 04/28/2012  . Chronic respiratory failure with hypoxia ([place]) 04/26/2012  . Diabetes ([place]) 03/30/2012  . Pulmonary hypertension ([place]) 03/30/2012  . Hyperlipidemia 03/30/2012  . Screening for breast cancer 03/30/2012  . Hypothyroidism 03/30/2012  . Chronic systolic heart failure ([place]) 01/22/2012  . Hypertension     Past Surgical History:  Procedure Laterality Date  . ABDOMINAL HYSTERECTOMY  1969  . CARDIAC CATHETERIZATION  2013   @ [facility]: No obstructive CAD: Only 20% ostial left circumflex.  [name] CARDIOVERSION N/A 10/12/2017    Procedure: CARDIOVERSION;  Surgeon: Minna Merritts, MD;  Location: [facility] ORS;  Service: Cardiovascular;  Laterality: N/A;  . CHOLECYSTECTOMY    . CYST REMOVAL TRUNK  2002   BACK  . DIALYSIS/PERMA CATHETER INSERTION N/A 10/06/2017   Procedure: DIALYSIS/PERMA CATHETER INSERTION;  Surgeon: Katha Cabal, MD;  Location: [facility];  Service: Cardiovascular;  Laterality: N/A;  . INSERT / REPLACE / REMOVE PACEMAKER    . lipoma removal  2014   back  . PACEMAKER INSERTION  [address] Scientific dual chamber pacemaker implanted by Dr Caryl Comes for symptomatic bradycardia  . PERMANENT PACEMAKER INSERTION N/A 01/09/2014   Procedure: PERMANENT PACEMAKER INSERTION;  Surgeon: Deboraha Sprang, MD;  Location: [facility] CATH LAB;  Service: Cardiovascular;  Laterality: N/A;  . RIGHT/LEFT HEART CATH AND CORONARY ANGIOGRAPHY N/A 10/19/2017   Procedure: RIGHT/LEFT HEART CATH AND CORONARY ANGIOGRAPHY;  Surgeon: [place] Hampshire, MD;  Location: [facility];  Service: Cardiovascular;  Laterality: N/A;  . TEE WITHOUT CARDIOVERSION N/A 08/20/2017   Procedure: TRANSESOPHAGEAL ECHOCARDIOGRAM (TEE) & Direct current cardioversion;  Surgeon: Minna Merritts, MD;  Location: [facility] ORS;  Service: Cardiovascular;  Laterality: N/A;  . TRIGGER FINGER RELEASE    . VAGINAL HYSTERECTOMY      Prior to Admission medications   Medication Sig Start Date End Date Taking? Authorizing Provider  acetaminophen (TYLENOL) 325 MG tablet Take 325-650 mg by mouth every 6 (six) hours as needed for mild pain. Reported on 10/10/2015    [provider]  albuterol (PROVENTIL) (2.5 MG/3ML) 0.083% nebulizer solution Take 3 mLs (2.5 mg total) by nebulization every 6 (six) hours. 08/28/17   Loletha Grayer, MD  amiodarone (PACERONE) 200 MG tablet Take 1 tablet (200 mg total) by mouth daily. 01/15/18   Theora Gianotti, NP  apixaban (ELIQUIS) 5 MG TABS tablet Take 1 tablet (5 mg total) by mouth 2 (two) times daily. 11/09/17    [place] Hampshire, MD  carvedilol (COREG) 3.125 MG tablet Take 1 tablet (3.125 mg total) by mouth 2 (two) times daily with a meal. 02/23/18   Theora Gianotti, NP  gabapentin (NEURONTIN) 100 MG capsule TAKE 2 CAPSULES BY MOUTH THREE TIMES DAILY 08/12/17   [name], MD  glucose blood (TRUE METRIX BLOOD GLUCOSE TEST) test strip Check blood glucose twice daily Dx:E11.8, Z79.4 09/17/17   [name], MD  losartan (COZAAR) 25 MG tablet Take 1 tablet (25 mg total) by mouth daily. 01/15/18   Theora Gianotti, NP  Menthol, Topical Analgesic, (BIOFREEZE EX) Apply 1 application topically as needed (for pain).     [provider]  metFORMIN (GLUCOPHAGE) 500 MG tablet Take 500 mg by mouth 2 (two) times daily with a meal.     [provider]  ondansetron (  ZOFRAN) 8 MG tablet Take 1 tablet (8 mg total) by mouth every 8 (eight) hours as needed for nausea or vomiting. 09/25/14   Jackolyn Confer, MD  potassium chloride SA (K-DUR,KLOR-CON) 20 MEQ tablet Take 1 tablet (20 mEq total) by mouth 2 (two) times daily. 12/04/17   Theora Gianotti, NP  rosuvastatin (CRESTOR) 10 MG tablet Take 1 tablet (10 mg total) by mouth daily. 11/30/17   [name], MD  sildenafil (REVATIO) 20 MG tablet Take 20 mg by mouth 3 (three) times daily. 05/20/17   [provider]  spironolactone (ALDACTONE) 25 MG tablet Take 0.5 tablets (12.5 mg total) by mouth daily. 01/07/18 04/07/18  Theora Gianotti, NP  torsemide (DEMADEX) 20 MG tablet Take 1 tablet (20 mg total) by mouth daily. 01/15/18 02/14/18  Theora Gianotti, NP  [name] 0.99 IP/3.8SN SOPN INJECT 0.75MG  SUBCUTANEOUSLY ONCE A WEEK 12/22/17   [name], MD    Allergies  Allergen Reactions  . Aspirin Hives and Other (See Comments)    Pt states that she is unable to take because she had bleeding in her brain.   Pt states that she is unable to take because she had bleeding in her  brain. Pt states that she is unable to take because she had bleeding in her brain. Pt states that she is unable to take because she had bleeding in her brain.  . Other Other (See Comments)    Pt states that she is unable to take blood thinners because she had bleeding in her brain.  Blood Thinners-per doctor at [place] states that she is unable to take blood thinners because she had bleeding in her brain.  Pt states that she is unable to take blood thinners because she had bleeding in her brain.  Blood Thinners-per doctor at [place] states that she is unable to take blood thinners because she had bleeding in her brain.  Blood Thinners-per doctor at [facility]  . Penicillins Hives, Shortness Of Breath, Swelling and Other (See Comments)    Has patient had a PCN reaction causing immediate rash, facial/tongue/throat swelling, SOB or lightheadedness with hypotension: Yes Has patient had a PCN reaction causing severe rash involving mucus membranes or skin necrosis: No Has patient had a PCN reaction that required hospitalization No Has patient had a PCN reaction occurring within the last 10 years: No If all of the above answers are "NO", then may proceed with Cephalosporin use.    Family History  Problem Relation Age of Onset  . Heart disease Mother   . Hypertension Mother   . COPD Mother        was a smoker  . Thyroid disease Mother   . Hypertension Father   . Hypertension Sister   . Thyroid disease Sister   . Hypertension Sister   . Thyroid disease Sister   . Breast cancer Maternal Aunt   . Kidney disease Neg Hx   . Bladder Cancer Neg Hx     Social History Social History   Tobacco Use  . Smoking status: Former Smoker    Packs/day: 0.30    Years: 2.00    Pack years: 0.60    Types: Cigarettes    Last attempt to quit: 06/23/1990    Years since quitting: 27.7  . Smokeless tobacco: Never Used  Substance Use Topics  . Alcohol use: No  . Drug use: No    Review  of Systems Constitutional: Negative for fever.  Cardiovascular: Negative for chest pain. Respiratory: Positive for shortness of breath worsening x3 days. Gastrointestinal: Negative for abdominal pain, vomiting  Genitourinary: Negative for urinary compaints Musculoskeletal: Left lower externally swelling, unchanged per patient. Skin: Negative for skin complaints  Neurological: Negative for headache All other ROS negative  ____________________________________________   PHYSICAL EXAM: Constitutional: Alert and oriented. Well appearing and in no distress. Eyes: Normal exam ENT   Head: Normocephalic and atraumatic.   Mouth/Throat: Mucous membranes are moist. Cardiovascular: Normal rate, regular rhythm.  Respiratory: Normal respiratory effort without tachypnea nor retractions. Breath sounds are clear.  No obvious wheeze rales or rhonchi. Gastrointestinal: Soft and nontender. No distention.   Musculoskeletal: Left lower extremity edema, chronic per patient and nontender.  Normal right lower extremity.. Neurologic:  Normal speech and language. No gross focal neurologic deficits Skin:  Skin is warm, dry and intact.  Psychiatric: Mood and affect are normal.  ____________________________________________    EKG  EKG reviewed and interpreted by myself shows sinus rhythm at 68 bpm with a narrow QRS, normal axis, normal intervals, nonspecific ST changes.  ____________________________________________    RADIOLOGY  Chest x-ray shows cardiomegaly with moderate diffuse interstitial edema and pulmonary edema.  ____________________________________________   INITIAL IMPRESSION / ASSESSMENT AND PLAN / ED COURSE  Pertinent labs & imaging results that were available during my care of the patient were reviewed by me and considered in my medical decision making (see chart for details).  Patient presents emergency department for 3 days of worsening shortness of breath.  Denies any chest  pain but does state some mild tightness.  Differential includes ACS, CHF, pneumonia, pneumothorax, URI.  Patient's x-ray is consistent with cardiomegaly with pulmonary edema.  Troponin is negative.  Vital signs are largely normal.  She is satting in the mid 90s on her normal 4 L.  However given her significant shortness of breath especially with minimal exertion we will admit to the hospitalist service for CHF exacerbation.  I have ordered IV Lasix for the patient.  ____________________________________________   FINAL CLINICAL IMPRESSION(S) / ED DIAGNOSES  Pulmonary edema CHF exacerbation.    Harvest Dark, MD 03/08/18 [place]    Harvest Dark, MD 03/08/18 [phone]

## 2018-03-09 ENCOUNTER — Inpatient Hospital Stay (HOSPITAL_COMMUNITY)
Admit: 2018-03-09 | Discharge: 2018-03-09 | Disposition: A | Discharge status: Home / Self Care | Payer: Medicare HMO | Attending: Internal Medicine | Admitting: Internal Medicine

## 2018-03-09 ENCOUNTER — Other Ambulatory Visit: Payer: Self-pay

## 2018-03-09 DIAGNOSIS — I340 Nonrheumatic mitral (valve) insufficiency: Secondary | ICD-10-CM

## 2018-03-09 LAB — CBC
HCT: 32.5 % — ABNORMAL LOW (ref 35.0–47.0)
Hemoglobin: 10.6 g/dL — ABNORMAL LOW (ref 12.0–16.0)
MCH: 27.6 pg (ref 26.0–34.0)
MCHC: 32.7 g/dL (ref 32.0–36.0)
MCV: 84.4 fL (ref 80.0–100.0)
Platelets: 189 10*3/uL (ref 150–440)
RBC: 3.85 MIL/uL (ref 3.80–5.20)
RDW: 15 % — ABNORMAL HIGH (ref 11.5–14.5)
WBC: 5.8 10*3/uL (ref 3.6–11.0)

## 2018-03-09 LAB — BASIC METABOLIC PANEL
Anion gap: 9 (ref 5–15)
BUN: 16 mg/dL (ref 8–23)
CO2: 28 mmol/L (ref 22–32)
Calcium: 9 mg/dL (ref 8.9–10.3)
Chloride: 105 mmol/L (ref 98–111)
Creatinine, Ser: 0.85 mg/dL (ref 0.44–1.00)
GFR calc Af Amer: >60 mL/min (ref >60)
GFR calc non Af Amer: >60 mL/min (ref >60)
Glucose, Bld: 103 mg/dL — ABNORMAL HIGH (ref 70–99)
Potassium: 3.4 mmol/L — ABNORMAL LOW (ref 3.5–5.1)
Sodium: 142 mmol/L (ref 135–145)

## 2018-03-09 LAB — MRSA PCR SCREENING: MRSA by PCR: NEGATIVE

## 2018-03-09 LAB — GLUCOSE, CAPILLARY
Glucose-Capillary: 123 mg/dL — ABNORMAL HIGH (ref 70–99)
Glucose-Capillary: 93 mg/dL (ref 70–99)
Glucose-Capillary: 117 mg/dL — ABNORMAL HIGH (ref 70–99)
Glucose-Capillary: 87 mg/dL (ref 70–99)
Glucose-Capillary: 84 mg/dL (ref 70–99)

## 2018-03-09 LAB — TROPONIN I: Troponin I: 0.12 ng/mL (ref <0.03)

## 2018-03-09 MED ORDER — SILDENAFIL CITRATE 20 MG PO TABS
20.0000 mg | ORAL_TABLET | Freq: Three times a day (TID) | ORAL | Status: DC
  Administered 2018-03-09 – 2018-03-12 (×10): 20 mg via ORAL
  Filled 2018-03-09 (×12): qty 1

## 2018-03-09 MED ORDER — TRAZODONE HCL 50 MG PO TABS: 25.0000 mg | ORAL_TABLET | Freq: Every evening | ORAL | Status: DC | PRN

## 2018-03-09 MED ORDER — BISACODYL 5 MG PO TBEC: 5.0000 mg | DELAYED_RELEASE_TABLET | Freq: Every day | ORAL | Status: DC | PRN

## 2018-03-09 MED ORDER — DOCUSATE SODIUM 100 MG PO CAPS
100.0000 mg | ORAL_CAPSULE | Freq: Two times a day (BID) | ORAL | Status: DC
  Administered 2018-03-11 – 2018-03-12 (×2): 100 mg via ORAL
  Filled 2018-03-09 (×7): qty 1

## 2018-03-09 MED ORDER — CARVEDILOL 6.25 MG PO TABS
3.1250 mg | ORAL_TABLET | Freq: Two times a day (BID) | ORAL | Status: DC
  Administered 2018-03-09 – 2018-03-12 (×7): 3.125 mg via ORAL
  Filled 2018-03-09 (×7): qty 1

## 2018-03-09 MED ORDER — TORSEMIDE 20 MG PO TABS
20.0000 mg | ORAL_TABLET | Freq: Every day | ORAL | Status: DC
  Filled 2018-03-09 (×2): qty 1

## 2018-03-09 MED ORDER — INSULIN ASPART 100 UNIT/ML ~~LOC~~ SOLN
0.0000 [IU] | Freq: Three times a day (TID) | SUBCUTANEOUS | Status: DC
  Administered 2018-03-10: 3 [IU] via SUBCUTANEOUS
  Administered 2018-03-10: 2 [IU] via SUBCUTANEOUS
  Administered 2018-03-11: 3 [IU] via SUBCUTANEOUS
  Administered 2018-03-11: 8 [IU] via SUBCUTANEOUS
  Administered 2018-03-12: 5 [IU] via SUBCUTANEOUS
  Filled 2018-03-09 (×5): qty 1

## 2018-03-09 MED ORDER — AMIODARONE HCL 200 MG PO TABS
200.0000 mg | ORAL_TABLET | Freq: Every day | ORAL | Status: DC
  Administered 2018-03-09 – 2018-03-12 (×4): 200 mg via ORAL
  Filled 2018-03-09 (×4): qty 1

## 2018-03-09 MED ORDER — LOSARTAN POTASSIUM 25 MG PO TABS
25.0000 mg | ORAL_TABLET | Freq: Every day | ORAL | Status: DC
  Administered 2018-03-09 – 2018-03-12 (×4): 25 mg via ORAL
  Filled 2018-03-09 (×4): qty 1

## 2018-03-09 MED ORDER — POTASSIUM CHLORIDE CRYS ER 20 MEQ PO TBCR
20.0000 meq | EXTENDED_RELEASE_TABLET | Freq: Two times a day (BID) | ORAL | Status: DC
  Administered 2018-03-09 – 2018-03-10 (×4): 20 meq via ORAL
  Filled 2018-03-09 (×4): qty 1

## 2018-03-09 MED ORDER — ROSUVASTATIN CALCIUM 10 MG PO TABS
10.0000 mg | ORAL_TABLET | Freq: Every day | ORAL | Status: DC
  Administered 2018-03-09 – 2018-03-12 (×4): 10 mg via ORAL
  Filled 2018-03-09 (×4): qty 1

## 2018-03-09 MED ORDER — GABAPENTIN 100 MG PO CAPS
200.0000 mg | ORAL_CAPSULE | Freq: Three times a day (TID) | ORAL | Status: DC
  Administered 2018-03-09 – 2018-03-12 (×10): 200 mg via ORAL
  Filled 2018-03-09 (×10): qty 2

## 2018-03-09 MED ORDER — FUROSEMIDE 10 MG/ML IJ SOLN
40.0000 mg | Freq: Two times a day (BID) | INTRAMUSCULAR | Status: DC
  Administered 2018-03-09 – 2018-03-10 (×2): 40 mg via INTRAVENOUS
  Filled 2018-03-09 (×3): qty 4

## 2018-03-09 MED ORDER — ONDANSETRON HCL 4 MG PO TABS: 4.0000 mg | ORAL_TABLET | Freq: Four times a day (QID) | ORAL | Status: DC | PRN

## 2018-03-09 MED ORDER — APIXABAN 5 MG PO TABS
5.0000 mg | ORAL_TABLET | Freq: Two times a day (BID) | ORAL | Status: DC
  Administered 2018-03-09 – 2018-03-12 (×7): 5 mg via ORAL
  Filled 2018-03-09 (×7): qty 1

## 2018-03-09 MED ORDER — ACETAMINOPHEN 325 MG PO TABS
650.0000 mg | ORAL_TABLET | Freq: Four times a day (QID) | ORAL | Status: DC | PRN
  Administered 2018-03-09 – 2018-03-11 (×7): 650 mg via ORAL
  Filled 2018-03-09 (×7): qty 2

## 2018-03-09 MED ORDER — HYDROCODONE-ACETAMINOPHEN 5-325 MG PO TABS
1.0000 | ORAL_TABLET | ORAL | Status: DC | PRN
  Administered 2018-03-11: 1 via ORAL
  Filled 2018-03-09: qty 1

## 2018-03-09 MED ORDER — IPRATROPIUM-ALBUTEROL 0.5-2.5 (3) MG/3ML IN SOLN
3.0000 mL | Freq: Three times a day (TID) | RESPIRATORY_TRACT | Status: DC
  Administered 2018-03-09 – 2018-03-12 (×8): 3 mL via RESPIRATORY_TRACT
  Filled 2018-03-09 (×9): qty 3

## 2018-03-09 MED ORDER — ACETAMINOPHEN 650 MG RE SUPP: 650.0000 mg | Freq: Four times a day (QID) | RECTAL | Status: DC | PRN

## 2018-03-09 MED ORDER — ALBUTEROL SULFATE (2.5 MG/3ML) 0.083% IN NEBU
2.5000 mg | INHALATION_SOLUTION | Freq: Four times a day (QID) | RESPIRATORY_TRACT | Status: DC
  Administered 2018-03-09 (×3): 2.5 mg via RESPIRATORY_TRACT
  Filled 2018-03-09 (×3): qty 3

## 2018-03-09 MED ORDER — ONDANSETRON HCL 4 MG/2ML IJ SOLN: 4.0000 mg | Freq: Four times a day (QID) | INTRAMUSCULAR | Status: DC | PRN

## 2018-03-09 MED ORDER — ALBUTEROL SULFATE (2.5 MG/3ML) 0.083% IN NEBU: 2.5000 mg | INHALATION_SOLUTION | RESPIRATORY_TRACT | Status: DC | PRN

## 2018-03-09 MED ORDER — SPIRONOLACTONE 25 MG PO TABS
12.5000 mg | ORAL_TABLET | Freq: Every day | ORAL | Status: DC
  Administered 2018-03-09 – 2018-03-12 (×4): 12.5 mg via ORAL
  Filled 2018-03-09 (×3): qty 0.5
  Filled 2018-03-09: qty 1
  Filled 2018-03-09: qty 0.5
  Filled 2018-03-09 (×2): qty 1

## 2018-03-09 MED ORDER — INSULIN ASPART 100 UNIT/ML ~~LOC~~ SOLN
0.0000 [IU] | Freq: Every day | SUBCUTANEOUS | Status: DC
  Administered 2018-03-10: 2 [IU] via SUBCUTANEOUS
  Filled 2018-03-09: qty 1

## 2018-03-09 NOTE — Progress Notes (Signed)
*  PRELIMINARY RESULTS* Echocardiogram 2D Echocardiogram has been performed.  Debra Saunders 03/09/2018, 9:07 AM

## 2018-03-09 NOTE — Care Management (Signed)
Patient admitted from home with Acute on chronic respiratory failure with hypoxia, likely secondary to acute pulmonary edema.  PCP [place].  Patient has chronic home O2 through [place].  Patient has previously been to [facility] in May of this year.  Patient has previously been open with [facility] for the heart failure protocol.  Patient would likely benefit from home health services at discharge.

## 2018-03-09 NOTE — Progress Notes (Signed)
   03/09/18 1000  Clinical Encounter Type  Visited With Patient;Family  Visit Type Initial;Spiritual support  Recommendations Follow-up as requested.  Spiritual Encounters  Spiritual Needs Emotional;Prayer (Conversation about the progression of her illness.)  Stress Factors  Patient Stress Factors Health changes  Family Stress Factors Health changes   Chaplain remembered the patient from an earlier admission. Patient was SOB but still able to communicate clearly. Chaplain was mindful of the patient's fatigue and breathing difficulties. Still, the patient wanted to share the progression of her medical concerns and her desires to get strong and recover. Chaplain prayed for healing, strength, and courage. Patient responded well to these themes. Chaplain offered pastoral and spiritual presence. Chaplain will return as needed.

## 2018-03-09 NOTE — Plan of Care (Signed)

## 2018-03-09 NOTE — Progress Notes (Addendum)
[facility] [place] NAME: Debra Saunders    MR#:  [id number]  DATE OF BIRTH:  1945-07-14  SUBJECTIVE: admitted for CHF exacerbation,  CHIEF COMPLAINT:   Chief Complaint  Patient presents with  . Shortness of Breath  Weight gain of 8pounds in 1 week as per her.  No chest pain.  Patient has left leg edema.  Feels awfully weak.  REVIEW OF SYSTEMS:   ROS CONSTITUTIONAL: No fever, fatigue or weakness.  EYES: No blurred or double vision.  EARS, NOSE, AND THROAT: No tinnitus or ear pain.  RESPIRATORY: Has shortness of breath, mild cough. CARDIOVASCULAR: No chest pain, orthopnea, edema.  GASTROINTESTINAL: No nausea, vomiting, diarrhea or abdominal pain.  GENITOURINARY: No dysuria, hematuria.  ENDOCRINE: No polyuria, nocturia,  HEMATOLOGY: No anemia, easy bruising or bleeding SKIN: No rash or lesion. MUSCULOSKELETAL: No joint pain or arthritis.   NEUROLOGIC: No tingling, numbness, weakness.  PSYCHIATRY: No anxiety or depression.   DRUG ALLERGIES:   Allergies  Allergen Reactions  . Aspirin Hives and Other (See Comments)    Pt states that she is unable to take because she had bleeding in her brain.   Pt states that she is unable to take because she had bleeding in her brain. Pt states that she is unable to take because she had bleeding in her brain. Pt states that she is unable to take because she had bleeding in her brain.  . Other Other (See Comments)    Pt states that she is unable to take blood thinners because she had bleeding in her brain.  Blood Thinners-per doctor at [place] states that she is unable to take blood thinners because she had bleeding in her brain.  Pt states that she is unable to take blood thinners because she had bleeding in her brain.  Blood Thinners-per doctor at [place] states that she is unable to take blood thinners because she had bleeding in her brain.  Blood Thinners-per doctor at  [facility]  . Penicillins Hives, Shortness Of Breath, Swelling and Other (See Comments)    Has patient had a PCN reaction causing immediate rash, facial/tongue/throat swelling, SOB or lightheadedness with hypotension: Yes Has patient had a PCN reaction causing severe rash involving mucus membranes or skin necrosis: No Has patient had a PCN reaction that required hospitalization No Has patient had a PCN reaction occurring within the last 10 years: No If all of the above answers are "NO", then may proceed with Cephalosporin use.    VITALS:  Blood pressure (!) 154/76, pulse 60, temperature 98.4 F (36.9 C), temperature source Oral, resp. rate 20, height 5' (1.524 m), weight 96.8 kg, SpO2 100 %.  PHYSICAL EXAMINATION:  GENERAL:  72 y.o.-year-old patient lying in the bed with no acute distress.  EYES: Pupils equal, round, reactive to light and accommodation. No scleral icterus. Extraocular muscles intact.  HEENT: Head atraumatic, normocephalic. Oropharynx and nasopharynx clear.  NECK:  Supple, no jugular venous distention. No thyroid enlargement, no tenderness.  LUNGS: Diminished air entry bilaterally. CARDIOVASCULAR: S1, S2 normal. No murmurs, rubs, or gallops.  ABDOMEN: Soft, nontender, nondistended. Bowel sounds present. No organomegaly or mass.  EXTREMITIES:  trace pedal edema, left leg edema more than right leg.  NEUROLOGIC: Cranial nerves II through XII are intact. Muscle strength 5/5 in all extremities. Sensation intact. Gait not checked.  PSYCHIATRIC: The patient is alert and oriented x 3.  SKIN: No obvious rash, lesion,  or ulcer.    LABORATORY PANEL:   CBC Recent Labs  Lab 03/09/18 0428  WBC 5.8  HGB 10.6*  HCT 32.5*  PLT 189   ------------------------------------------------------------------------------------------------------------------  Chemistries  Recent Labs  Lab 03/09/18 0428  NA 142  K 3.4*  CL 105  CO2 28  GLUCOSE 103*  BUN 16  CREATININE 0.85  CALCIUM  9.0   ------------------------------------------------------------------------------------------------------------------  Cardiac Enzymes Recent Labs  Lab 03/09/18 0428  TROPONINI 0.12*   ------------------------------------------------------------------------------------------------------------------  RADIOLOGY:  Dg Chest Portable 1 View  Result Date: 03/08/2018 CLINICAL DATA:  Short of breath EXAM: PORTABLE CHEST 1 VIEW COMPARISON:  11/05/2016, 10/12/2017, 10/08/2017 FINDINGS: Left-sided pacing device as before. Cardiomegaly with vascular congestion and moderate interstitial and ground-glass opacities suspicious for pulmonary edema. No large effusion. Aortic atherosclerosis. IMPRESSION: Cardiomegaly with vascular congestion and moderate diffuse interstitial and ground-glass opacity suspicious for pulmonary edema Electronically Signed   By: Donavan Foil M.D.   On: 03/08/2018 22:17    EKG:   Orders placed or performed during the hospital encounter of 03/08/18  . ED EKG within 10 minutes  . ED EKG within 10 minutes  . EKG 12-Lead  . EKG 12-Lead    ASSESSMENT AND PLAN:  72 year old female with history of hypertension, hyperlipidemia, CAD, diabetes mellitus type 2, COPD on chronic oxygen 3 L comes in because of shortness of breath, weight gain, chest x-ray showing pulmonary vascular congestion, admitted for acute on chronic systolic and diastolic heart failure. 1.  Acute on chronic combined systolic and diastolic heart failure.  [facility] health cardiology.  Patient has been doubling up torsemide since yesterday but without improvement so she came to hospital because of worsening shortness of breath.  Now on Lasix 40 mg twice daily but noted no urine output improvement.  [facility]ology.  Check daily weights, continue Lasix, patient to be compliant with sodium, fluid restriction. 2.  History of pulmonary hypertension, patient follows with pulmonary physician, also has  appointment with advanced heart failure clinic in [place], tomorrow, she is on sildenafil and torsemide. #3 chronic respiratory failure in setting of COPD, pulmonary hypertension, heart failure: Continue oxygen, patient does have CPAP: Continue CPAP at night. 4.  Persistent atrial fibrillation status post pacemaker, continue Coreg, amiodarone, Eliquis.  #5 mild hypokalemia replaced the potassium while on diuretics.  #6 slightly elevated troponins likely secondary to CHF, patient already on Eliquis, beta-blockers, consulted cardiology.  Pa All the records are reviewed and case discussed with Care Management/Social Workerr. Management plans discussed with the patient, family and they are in agreement.  CODE STATUS: Full code  TOTAL TIME TAKING CARE OF THIS PATIENT: 38 minutes.   POSSIBLE D/C IN 1-2DAYS, DEPENDING ON CLINICAL CONDITION.   Epifanio Lesches M.D on 03/09/2018 at 1:36 PM  Between 7am to 6pm - Pager - [phone]  After 6pm go to [email or web address] - password [email or web address] [facility]  [facility]  Office  [phone]  CC: Primary care physician; [name], MD   Note: This dictation was prepared with Dragon dictation along with smaller phrase technology. Any transcriptional errors that result from this process are unintentional.

## 2018-03-09 NOTE — Patient Outreach (Signed)
[facility]) Care Management   03/09/2018  Debra Saunders 11-13-1945 [id number]  Debra Saunders is an 72 y.o. female    [facility] Visit  Subjective: Patient states she was doing "OK" until the last 3 days. "I went to my daughters on Sunday and thought I was good but I just kept getting more and more short of breath". "I didn't want to tell them because I knew they would make me come here and I just wanted to get to my [facility] Failure Appointment on Wednesday". Patient states her weight was up and down. She denied to RN CM 5 lb weight gain in 1 week however states she did have 2 lbs overnight but would double up on her diuretic and her weight would reduce but her shortness of breath would not improve. "This went on for about a week". "On Sunday I just stopped going to the bathroom as much even though I took an extra fluid pill".  Patient also states "I have been out of my Eliquis for a week and I am so glad they are giving it to me here".  Objective:   ROS   Physical Exam  Not completed related to ongoing assessment by hospital RN  Encounter Medications:   Facility-Administered Encounter Medications as of 03/09/2018  Medication  . acetaminophen (TYLENOL) tablet 650 mg   Or  . acetaminophen (TYLENOL) suppository 650 mg  . albuterol (PROVENTIL) (2.5 MG/3ML) 0.083% nebulizer solution 2.5 mg  . amiodarone (PACERONE) tablet 200 mg  . apixaban (ELIQUIS) tablet 5 mg  . bisacodyl (DULCOLAX) EC tablet 5 mg  . carvedilol (COREG) tablet 3.125 mg  . docusate sodium (COLACE) capsule 100 mg  . furosemide (LASIX) injection 40 mg  . gabapentin (NEURONTIN) capsule 200 mg  . HYDROcodone-acetaminophen (NORCO/VICODIN) 5-325 MG per tablet 1-2 tablet  . insulin aspart (novoLOG) injection 0-15 Units  . insulin aspart (novoLOG) injection 0-5 Units  . ipratropium-albuterol (DUONEB) 0.5-2.5 (3) MG/3ML nebulizer solution 3 mL  . losartan (COZAAR) tablet 25 mg  .  ondansetron (ZOFRAN) tablet 4 mg   Or  . ondansetron (ZOFRAN) injection 4 mg  . potassium chloride SA (K-DUR,KLOR-CON) CR tablet 20 mEq  . rosuvastatin (CRESTOR) tablet 10 mg  . sildenafil (REVATIO) tablet 20 mg  . spironolactone (ALDACTONE) tablet 12.5 mg  . torsemide (DEMADEX) tablet 20 mg  . traZODone (DESYREL) tablet 25 mg   Outpatient Encounter Medications as of 03/09/2018  Medication Sig Note  . acetaminophen (TYLENOL) 325 MG tablet Take 325-650 mg by mouth every 6 (six) hours as needed for mild pain. Reported on 10/10/2015   . albuterol (PROVENTIL) (2.5 MG/3ML) 0.083% nebulizer solution Take 3 mLs (2.5 mg total) by nebulization every 6 (six) hours.   [name] amiodarone (PACERONE) 200 MG tablet Take 1 tablet (200 mg total) by mouth daily.   [name] apixaban (ELIQUIS) 5 MG TABS tablet Take 1 tablet (5 mg total) by mouth 2 (two) times daily.   . carvedilol (COREG) 3.125 MG tablet Take 1 tablet (3.125 mg total) by mouth 2 (two) times daily with a meal.   . gabapentin (NEURONTIN) 100 MG capsule TAKE 2 CAPSULES BY MOUTH THREE TIMES DAILY   . 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 losartan (COZAAR) 25 MG tablet Take 1 tablet (25 mg total) by mouth daily.   . Menthol, Topical Analgesic, (BIOFREEZE EX) Apply 1 application topically as needed (for  pain).    . metFORMIN (GLUCOPHAGE) 500 MG tablet Take 500 mg by mouth 2 (two) times daily with a meal.    . ondansetron (ZOFRAN) 8 MG tablet Take 1 tablet (8 mg total) by mouth every 8 (eight) hours as needed for nausea or vomiting.   . potassium chloride SA (K-DUR,KLOR-CON) 20 MEQ tablet Take 1 tablet (20 mEq total) by mouth 2 (two) times daily.   . rosuvastatin (CRESTOR) 10 MG tablet Take 1 tablet (10 mg total) by mouth daily.   . sildenafil (REVATIO) 20 MG tablet Take 20 mg by mouth 3 (three) times daily.   [name] spironolactone (ALDACTONE) 25 MG tablet Take 0.5 tablets (12.5 mg total) by mouth  daily.   [name] torsemide (DEMADEX) 20 MG tablet Take 1 tablet (20 mg total) by mouth daily. 02/04/2018: Patient takes an additional 20mg  tablet in the pm if she gains 2 lbs overnight or 5 lbs in 1 week  . [name] 7.51 ZG/0.1VC SOPN INJECT 0.75MG  SUBCUTANEOUSLY ONCE A WEEK 01/14/2018: Sunday    Functional Status:   In your present state of health, do you have any difficulty performing the following activities: 03/09/2018 01/05/2018  Hearing? N N  Vision? N Y  Difficulty concentrating or making decisions? N Y  Walking or climbing stairs? N Y  Dressing or bathing? N Y  Doing errands, shopping? N Y  Conservation officer, nature and eating ? - Y  Using the Toilet? - N  In the past six months, have you accidently leaked urine? - Y  Do you have problems with loss of bowel control? - Y  Managing your Medications? - Y  Managing your Finances? - Y  Housekeeping or managing your Housekeeping? - Y  Some recent data might be hidden    Fall/Depression Screening:    Fall Risk  01/05/2018 11/18/2017 09/21/2017  Falls in the past year? No No No  Risk for fall due to : - - -   PHQ 2/9 Scores 12/31/2017 11/18/2017 09/21/2017 07/01/2017 10/21/2016 10/21/2016 05/06/2016  PHQ - 2 Score 0 0 1 0 0 0 0  PHQ- 9 Score - - - 0 5 - -    Assessment:  Upon arrival patient sitting comfortable in bed. Tachypnea and shortness of breath noted with conversation. C/O right sided chest discomfort with inspiration. Described as burning and "scratching". Primary RN in to assess and medicate. Patient grateful for RN CM coming by to see her. Patient's daughter has cancelled appointment to [facility] tomorrow however unable to find number for transportation. RN CM obtained number for [facility] transportation from [place] and transportation cancelled.  Discussed with patient reason for her not taking her Eliquis. Patient states she just did not have the money to pick it up and her children did not have the money to help her. Patient is not  sure if she will have the money to pick up at discharge. Ruben Reason, [facility] currently working with patient on medication assistance for [name] and Eliquis. Spoke with [facility] and informed her of patient inability to pick up medication at discharge related to finance.  Plan: Follow patient remotely during admission and complete telephone follow-up upon her discharge.  [facility] CM Care Plan Problem One     Most Recent Value  Care Plan Problem One  HF-High risk for admission  Role Documenting the Problem One  Care Management [place] for Problem One  Active  [facility] Long Term Goal   Patient will not  have hospital readmission for HF within the next 31 days  [facility] Long Term Goal Start Date  03/09/18 Sudie Grumbling reset related to current hospitalization]  Interventions for Problem One Long Term Goal  discussed continuation of action plan at discharge, discussed low sodium compliance upon discharge  [facility] CM Short Term Goal #1   Over the next 30 days patient will take medications as prescribed as evidenced by patient reporting and review of EMR  [facility] CM Short Term Goal #1 Start Date  03/09/18  Interventions for Short Term Goal #1  discussed medication compliance upon discharge, collaborated with [facility] for medication assistance to make sure patient will have all discharge medications available including Eliquis  [facility] CM Short Term Goal #2   Over the next 30 days patient will attend all follow up MD visits as scheduled as evidenced by patient reporting and review of EMR  [facility] CM Short Term Goal #2 Start Date  03/09/18  Interventions for Short Term Goal #2  discussed with patient importance of PCP follow up at discharge and notifying [facility] of need for new appointment     Katsumi Wisler E. [name] RN, BSN [facility] Care Management Coordinator [phone]

## 2018-03-10 ENCOUNTER — Ambulatory Visit: Payer: Self-pay

## 2018-03-10 ENCOUNTER — Ambulatory Visit (HOSPITAL_COMMUNITY): Payer: Medicare HMO | Admitting: Internal Medicine

## 2018-03-10 ENCOUNTER — Other Ambulatory Visit: Payer: Self-pay | Admitting: Pharmacist

## 2018-03-10 ENCOUNTER — Telehealth: Payer: Self-pay | Admitting: Cardiovascular Disease

## 2018-03-10 DIAGNOSIS — I5033 Acute on chronic diastolic (congestive) heart failure: Secondary | ICD-10-CM

## 2018-03-10 DIAGNOSIS — I481 Persistent atrial fibrillation: Secondary | ICD-10-CM

## 2018-03-10 DIAGNOSIS — I272 Pulmonary hypertension, unspecified: Secondary | ICD-10-CM

## 2018-03-10 DIAGNOSIS — I248 Other forms of acute ischemic heart disease: Secondary | ICD-10-CM

## 2018-03-10 LAB — GLUCOSE, CAPILLARY
Glucose-Capillary: 86 mg/dL (ref 70–99)
Glucose-Capillary: 121 mg/dL — ABNORMAL HIGH (ref 70–99)
Glucose-Capillary: 192 mg/dL — ABNORMAL HIGH (ref 70–99)
Glucose-Capillary: 204 mg/dL — ABNORMAL HIGH (ref 70–99)

## 2018-03-10 LAB — ECHOCARDIOGRAM COMPLETE
Height: 60 in
Weight: 3269.86 oz

## 2018-03-10 MED ORDER — METHYLPREDNISOLONE SODIUM SUCC 40 MG IJ SOLR
40.0000 mg | Freq: Every day | INTRAMUSCULAR | Status: DC
  Administered 2018-03-10 – 2018-03-12 (×3): 40 mg via INTRAVENOUS
  Filled 2018-03-10 (×3): qty 1

## 2018-03-10 MED ORDER — FUROSEMIDE 10 MG/ML IJ SOLN
40.0000 mg | Freq: Two times a day (BID) | INTRAMUSCULAR | Status: DC
  Administered 2018-03-10 – 2018-03-11 (×2): 40 mg via INTRAVENOUS
  Filled 2018-03-10 (×2): qty 4

## 2018-03-10 NOTE — Telephone Encounter (Signed)
Almyra Free from [facility] calling States that patient is currently in [facility]  Patient has not taken the Eliquis medication in over a week because she is out Almyra Free has helped patient file for patient assistance but would like to know if we will be able to provide samples Please call to discuss [phone]  Patient calling the office for samples of medication:   1.  What medication and dosage are you requesting samples for? Eliquis 5 MG  2.  Are you currently out of this medication? Yes

## 2018-03-10 NOTE — Telephone Encounter (Signed)
Left message with Almyra Free with [facility] that we should be able to help with samples until patient assistance has gone through. Asked her to call us back.

## 2018-03-10 NOTE — Evaluation (Signed)
Physical Therapy Evaluation Patient Details Name: Debra Saunders MRN: [id number] DOB: [birth year] Today's Date: 03/10/2018   History of Present Illness  Pt is a 72 y.o. female presenting to hospital 03/08/18 with increasing SOB x2-3 days.  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  Also noted with costochondritis.  PMH includes 4 L home O2, COPD, CAD, CHF, htn, chronic back pain, a-fib, h/o ICH, chronic L LE edema, chronic DVT, PE, permcath 10/12/17, pacemaker.  Clinical Impression  Prior to hospital admission, pt was modified independent ambulating household distances with rollator; 4 L home O2 chronic.  Pt lives alone in 1 level home with ramp to enter (only leaves home for MD appts).  Currently pt is modified independent semi-supine to sit; CGA with transfers; and CGA ambulating 60 feet with RW in room.  Overall pt steady with ambulation but demonstrating generalized weakness and SOB with activity (O2 sats 95% or greater on 4 L O2 via nasal cannula during session).  Pt would benefit from skilled PT to address noted impairments and functional limitations (see below for any additional details).  Upon hospital discharge, recommend pt discharge to home with HHPT.    Follow Up Recommendations Home health PT    Equipment Recommendations  (rollator (pt has rollator at home already))    Recommendations for Other Services       Precautions / Restrictions Precautions Precautions: Fall Restrictions Weight Bearing Restrictions: No      Mobility  Bed Mobility Overal bed mobility: Modified Independent             General bed mobility comments: Semi-supine to sit with mild increased effort  Transfers Overall transfer level: Needs assistance Equipment used: Rolling walker (2 wheeled) Transfers: Sit to/from Stand Sit to Stand: Min guard         General transfer  comment: strong steady transfer noted  Ambulation/Gait Ambulation/Gait assistance: Min guard Gait Distance (Feet): 60 Feet Assistive device: Rolling walker (2 wheeled) Gait Pattern/deviations: Step-through pattern Gait velocity: mildly decreased   General Gait Details: mild flexed trunk, steady with RW; limited d/t SOB  Stairs            Wheelchair Mobility    Modified Rankin (Stroke Patients Only)       Balance Overall balance assessment: Needs assistance Sitting-balance support: No upper extremity supported;Feet supported Sitting balance-Leahy Scale: Fair Sitting balance - Comments: steady sitting reaching within BOS   Standing balance support: No upper extremity supported Standing balance-Leahy Scale: Good Standing balance comment: steady standing reaching within BOS                             Pertinent Vitals/Pain Pain Assessment: 0-10 Pain Score: 6  Pain Location: chest pain Pain Descriptors / Indicators: Sore;Tender Pain Intervention(s): Limited activity within patient's tolerance;Monitored during session;Repositioned;Other (comment)(RN and MD notified)    Home Living Family/patient expects to be discharged to:: Private residence Living Arrangements: Alone Available Help at Discharge: Family;Friend(s);Available PRN/intermittently Type of Home: House Home Access: Ramped entrance     Home Layout: One level Home Equipment: Walker - 2 wheels;Walker - 4 wheels      Prior Function Level of Independence: Independent with assistive device(s)         Comments: Modified independent with 4ww for household distances to perform ADL's.  Only leaves home for MD appointments.  Family checks  in occasionally.     Hand Dominance        Extremity/Trunk Assessment   Upper Extremity Assessment Upper Extremity Assessment: Generalized weakness    Lower Extremity Assessment Lower Extremity Assessment: Generalized weakness    Cervical / Trunk  Assessment Cervical / Trunk Assessment: Normal  Communication   Communication: No difficulties  Cognition Arousal/Alertness: Awake/alert Behavior During Therapy: WFL for tasks assessed/performed Overall Cognitive Status: Within Functional Limits for tasks assessed                                        General Comments   Nursing cleared pt for participation in physical therapy.  Pt agreeable to PT session.  Per discussion with MD Leslye Peer, MD not concerned regarding PE and pain is reproducible (MD cleared PT to work with pt).    Exercises     Assessment/Plan    PT Assessment Patient needs continued PT services  PT Problem List Decreased strength;Decreased activity tolerance;Decreased balance;Decreased mobility;Cardiopulmonary status limiting activity;Pain       PT Treatment Interventions DME instruction;Gait training;Functional mobility training;Therapeutic activities;Therapeutic exercise;Balance training;Patient/family education    PT Goals (Current goals can be found in the Care Plan section)  Acute Rehab PT Goals Patient Stated Goal: to improve breathing PT Goal Formulation: With patient Time For Goal Achievement: 03/24/18 Potential to Achieve Goals: Good    Frequency Min 2X/week   Barriers to discharge        Co-evaluation               AM-PAC PT "6 Clicks" Daily Activity  Outcome Measure Difficulty turning over in bed (including adjusting bedclothes, sheets and blankets)?: A Little Difficulty moving from lying on back to sitting on the side of the bed? : A Little Difficulty sitting down on and standing up from a chair with arms (e.g., wheelchair, bedside commode, etc,.)?: Unable Help needed moving to and from a bed to chair (including a wheelchair)?: A Little Help needed walking in hospital room?: A Little Help needed climbing 3-5 steps with a railing? : A Little 6 Click Score: 16    End of Session Equipment Utilized During Treatment: Gait  belt;Oxygen(4 L O2 via nasal cannula) Activity Tolerance: Patient tolerated treatment well Patient left: in chair;with call bell/phone within reach;with chair alarm set Nurse Communication: Mobility status;Precautions;Other (comment)(Pt's pain status) PT Visit Diagnosis: Other abnormalities of gait and mobility (R26.89);Muscle weakness (generalized) (M62.81);Difficulty in walking, not elsewhere classified (R26.2)    Time: 6295-2841 PT Time Calculation (min) (ACUTE ONLY): 28 min   Charges:   PT Evaluation $PT Eval Low Complexity: 1 Low PT Treatments $Therapeutic Activity: 8-22 mins       Leitha Bleak, PT 03/10/18, 5:10 PM [phone]

## 2018-03-10 NOTE — Plan of Care (Signed)

## 2018-03-10 NOTE — Progress Notes (Signed)
Patient ID: Debra Saunders, female   DOB: 1945/11/02, 72 y.o.   MRN: [id number]  [place] PROGRESS NOTE  Debra Saunders LOV:[id number] DOB: 10/10/1945 DOA: 03/08/2018 PCP: [name], MD  HPI/Subjective: Patient complaining of some chest discomfort right side of her chest worse with pressing and taking deep breath.  Having a burning type sensation there.  States she may have had a cold.  She states that she gained some weight about 8 pounds.  Normally her weight is around 210.  Objective: Vitals:   03/10/18 0430 03/10/18 0832  BP: (!) 161/73 (!) 149/75  Pulse: 62 62  Resp: 19 16  Temp: 98.9 F (37.2 C) 98 F (36.7 C)  SpO2: 100% 100%    Intake/Output Summary (Last 24 hours) at 03/10/2018 1058 Last data filed at 03/10/2018 1025 Gross per 24 hour  Intake 240 ml  Output 2400 ml  Net -2160 ml   Filed Weights   03/09/18 1200 03/10/18 0500 03/10/18 0832  Weight: 96.8 kg 98.8 kg 98.7 kg    ROS: Review of Systems  Constitutional: Negative for chills and fever.  Eyes: Negative for blurred vision.  Respiratory: Positive for shortness of breath. Negative for cough.   Cardiovascular: Positive for chest pain.  Gastrointestinal: Negative for abdominal pain, constipation, diarrhea, nausea and vomiting.  Genitourinary: Negative for dysuria.  Musculoskeletal: Negative for joint pain.  Neurological: Negative for dizziness and headaches.   Exam: Physical Exam  Constitutional: She is oriented to person, place, and time.  HENT:  Nose: No mucosal edema.  Mouth/Throat: No oropharyngeal exudate or posterior oropharyngeal edema.  Eyes: Pupils are equal, round, and reactive to light. Conjunctivae, EOM and lids are normal.  Neck: No JVD present. Carotid bruit is not present. No edema present. No thyroid mass and no thyromegaly present.  Cardiovascular: S1 normal and S2 normal. Exam reveals no gallop.  No murmur heard. Pulses:      Dorsalis pedis pulses are 1+ on the right  side, and 1+ on the left side.  Respiratory: No respiratory distress. She has decreased breath sounds in the right lower field and the left lower field. She has no wheezes. She has no rhonchi. She has rales in the right lower field and the left lower field.  GI: Soft. Bowel sounds are normal. There is no tenderness.  Musculoskeletal:       Right ankle: She exhibits no swelling.       Left ankle: She exhibits swelling.  Lymphadenopathy:    She has no cervical adenopathy.  Neurological: She is alert and oriented to person, place, and time. No cranial nerve deficit.  Skin: Skin is warm. No rash noted. Nails show no clubbing.  Psychiatric: She has a normal mood and affect.      Data Reviewed: Basic Metabolic Panel: Recent Labs  Lab 03/08/18 2148 03/09/18 0428  NA 138 142  K 3.8 3.4*  CL 105 105  CO2 27 28  GLUCOSE 120* 103*  BUN 18 16  CREATININE 0.93 0.85  CALCIUM 9.0 9.0   CBC: Recent Labs  Lab 03/08/18 2148 03/09/18 0428  WBC 6.1 5.8  HGB 11.7* 10.6*  HCT 36.5 32.5*  MCV 84.6 84.4  PLT 202 189   Cardiac Enzymes: Recent Labs  Lab 03/08/18 2148 03/09/18 0428  TROPONINI 0.03* 0.12*   BNP (last 3 results) Recent Labs    09/30/17 1545 10/28/17 0350 11/05/17 1514  BNP 1,151.0* 290.0* 989.0*    CBG: Recent Labs  Lab 03/09/18  0745 03/09/18 1142 03/09/18 1651 03/09/18 2108 03/10/18 0756  GLUCAP 93 117* 87 84 86    Recent Results (from the past 240 hour(s))  MRSA PCR Screening     Status: None   Collection Time: 03/09/18 12:58 AM  Result Value Ref Range Status   MRSA by PCR NEGATIVE NEGATIVE Final    Comment:        [name] (FDA approved for NASAL specimens only), is one component of a comprehensive MRSA colonization surveillance program. It is not intended to diagnose MRSA infection nor to guide or monitor treatment for MRSA infections. Performed at [facility], [address] [place], [place] [address]       Studies: Dg Chest Portable 1 View  Result Date: 03/08/2018 CLINICAL DATA:  Short of breath EXAM: PORTABLE CHEST 1 VIEW COMPARISON:  11/05/2016, 10/12/2017, 10/08/2017 FINDINGS: Left-sided pacing device as before. Cardiomegaly with vascular congestion and moderate interstitial and ground-glass opacities suspicious for pulmonary edema. No large effusion. Aortic atherosclerosis. IMPRESSION: Cardiomegaly with vascular congestion and moderate diffuse interstitial and ground-glass opacity suspicious for pulmonary edema Electronically Signed   By: Donavan Foil M.D.   On: 03/08/2018 22:17    Scheduled Meds: . amiodarone  200 mg Oral Daily  . apixaban  5 mg Oral BID  . carvedilol  3.125 mg Oral BID WC  . docusate sodium  100 mg Oral BID  . furosemide  40 mg Intravenous Q12H  . gabapentin  200 mg Oral TID  . insulin aspart  0-15 Units Subcutaneous TID WC  . insulin aspart  0-5 Units Subcutaneous QHS  . ipratropium-albuterol  3 mL Nebulization TID  . losartan  25 mg Oral Daily  . methylPREDNISolone (SOLU-MEDROL) injection  40 mg Intravenous Daily  . potassium chloride SA  20 mEq Oral BID  . rosuvastatin  10 mg Oral Daily  . sildenafil  20 mg Oral TID  . spironolactone  12.5 mg Oral Daily  . torsemide  20 mg Oral Daily   Continuous Infusions:  Assessment/Plan:  1. Acute on chronic combined systolic and diastolic congestive heart failure with moderate mitral regurgitation.  Ejection fraction better now than it was in the past.  Diuresis with IV Lasix.  Continue Spironolactone losartan Coreg. 2. Chest wall costochondritis.  Solu-Medrol daily.  Allergy to anti-inflammatories. 3. Paroxysmal atrial fibrillation on amiodarone and Eliquis 4. Hypertension continue usual medications 5. Hyperlipidemia unspecified on Crestor 6. History of pulmonary hypertension on sildenafil 7. Type 2 diabetes mellitus on sliding scale 8. Weakness.  Physical therapy evaluation  Code Status:     Code Status  Orders  (From admission, onward)         Start     Ordered   03/09/18 0051  Full code  Continuous     03/09/18 0050        Code Status History    Date Active Date Inactive Code Status Order ID Comments User Context   09/30/2017 1901 10/22/2017 2001 Full Code [id number]  Gorden Harms, MD Inpatient   08/16/2017 1614 08/28/2017 2239 Full Code [id number]  Gorden Harms, MD Inpatient   03/30/2015 2152 04/03/2015 2122 Full Code [id number]  Etta Quill, DO Inpatient   03/27/2015 2208 03/28/2015 1737 Full Code [id number]  Nicholes Mango, MD ED   01/09/2014 1418 01/10/2014 1803 Full Code [id number]  Deboraha Sprang, MD Inpatient   01/16/2013 2335 01/27/2013 2011 Full Code [id number]  Collene Gobble, MD Inpatient    Advance Directive  Documentation     Most Recent Value  Type of Advance Directive  Healthcare Power of Attorney  Pre-existing out of facility DNR order (yellow form or pink MOST form)  -  "MOST" Form in Place?  -      Disposition Plan: Evaluate daily.  Time spent: 28 minutes   [place]

## 2018-03-10 NOTE — Consult Note (Signed)
Cardiology Consultation:   Patient ID: Debra Saunders; [id number]; 1945/06/30   Admit date: 03/08/2018 Date of Consult: 03/10/2018  Primary Care Provider: [name], MD Primary Cardiologist: Fletcher Anon   Patient Profile:   Debra Saunders is a 72 y.o. female with a hx of nonobstructive CAD by cath in 09/2017, chronic combined systolic and diastolic CHF, persistent Afib on Eliquis, symptomatic bradycardia status post permanent pacemaker, frequent PVCs, chronic respiratory failure with hypoxia on home O2 secondary to COPD, DM2, HTN, HLD, obesity, traumatic subarachnoid hemorrhage in the setting of Xarelto, left lower extremity DVT status post IVC filter, chronic right-sided atypical chest pain, chronic left lower extremity lymphedema, prior stroke, sleep apnea, cardiorenal syndrome, and pulmonary hypertension who is being seen today for the evaluation of SOB at the request of Dr. Vianne Bulls.  History of Present Illness:   Debra Saunders was hospitalized in February 2019, with heart failure and Afib with RVR. During that admission, she was diuresed and after review of prior neurosurgical records, she was placed on Eliquis. She later underwent TEE/DCCV and was noted to have intermittent paroxysmal Afib post cardioversion but was subsequently discharged to a skilled nursing facility in sinus rhythm.Following hospital discharge, she felt poorly and was volume overloaded at follow-up on 3/25.She was also noted to be back in Afib. Diuretics were changed to torsemide but unfortunately, she continued to have persistent volume overload, dyspnea, orthopnea, and weight gain. She was sent to the emergency room for admission on 4/10 and was placed on IV Lasix.She had some initial improvement but continued to have volume overload, even after cardioversion.She then developed acute kidney injury. She was seen by nephrology and required 2 separate sessions of hemodialysis.Creatinine peaked at 2.7. Repeat echo  during hospitalization showed a pulmonary artery systolic pressure of 63. As creatinine continued to improve with diuretics on hold, she underwent right and left heart catheterization on 4/29 revealing minimal nonobstructive CAD and elevated right heart pressures, including a PA pressure of 84/29.Wedge was 20.It was felt that pulmonary hypertension was out of proportion to left-sided heart failure. She was placed on low-dose torsemide, with recommendation for outpatient advanced heart failure follow-up. Unfortunately, Ms. Harner has been unable to obtain a ride to [facility] for heart failure clinic. [facility] with a weight of 231 pounds and ultimately was noted to have a weight that had trended down to 206 pounds. She was seen in the ED 5/16 with elevated BP and upper chest congestion. BNP 989 at that time. She was treated with IV Lasix and discharged home. She was seen in the office on 5/23 and was doing reasonably well. BP was well controlled at that time at 120/70 with a weight of 210 pounds. She was seen in the [facility] on 5/29 with a weight of 212 pounds. She was advised to take torsemide an extra torsemide 20 mg (for a total of 40 mg) that day. She was most recently seen in the office on 8/13 with a weight of 213 pounds. She was scheduled to see the advanced heart failure clinic in [place] given her pulmonary hypertension (9/18). She was continued on torsemide 20 mg daily with a prn 20 mg for weight gain.   She presented to [facility] on 9/16 with worsening SOB, being unable to ambulate from her stove to the kitchen table without becoming severely SOB. She indicates she has been unable to ambulate several feet without becoming symptomatic. She continues to note some right-sided  chest soreness that is not new for her. She notes worsening orthopnea. Her dry weight is ~ 210 pounds and more recently she has noted a weight around 218 pounds at home. She has been taking her prn  dose of torsemide on an almost daily basis. She was originally scheduled to see the advance heart failure clinic this week, though her SOB worsened to the point she presented to the ED. She reports compliance with her diet and diuretic. She has been non-compliant with her Eliquis however, missing the entire last week secondary to finances.   Upon the patient's arrival to [facility] they were found to have BP ranging from the 956L to 875I systolic, HR 43P bpm, temp 98.4, oxygen saturation 100% on nasal cannula at 4 L (her baseline is 4 L), weight 217.5 pounds. EKG as below, CXR showed cardiomegaly with vascular congestion and pulmonary edema. Labs showed troponin 0.03-->0.12, WBC 6.1, HGB 11.7, PLT 202, SCr 0.93-->0.85, K+ 3.8-->3.4. She was given IV Lasix 60 mg x1 in the ED and has been placed on IV Lasix 40 mg bid since her admission. Documented UOP of 2.1 L for the past 24 hours with a net - of 4.7 L for the admission. She continues to note dyspnea, though this is somewhat improved. Echo on 03/09/2018 showed an EF of 55-60%, Gr1DD, aortic sclerosis without stenosis, mild to moderate MR, mildly dilated LA, mildly reduced RVSF, mild TR, PASP 35-40 mmHg.   Past Medical History:  Diagnosis Date  . Arthritis   . Atrial fibrillation, persistent ([place])    a. s/p TEE-guided DCCV on 08/20/2017  . Cardiorenal syndrome    a. 09/2017 Creat rose to 2.7 w/ diuresis-->HD x 2.  . Chest pain    a. 01/2012 Cath: nonobs dzs;  c. 04/2014 MV: No ischemia.  . Chronic back pain   . Chronic combined systolic (congestive) and diastolic (congestive) heart failure ([place])    a. Dx 2005 @ [facility];  b. 02/2014 Echo: Ef 55-60%, no rwma, mild MR, PASP 81mmHg. c. 07/2017: EF 30-35%, diffuse HK, trivial AI, severe MR, moderate TR; d. 09/2017 Echo: EF 40-45%, ant, antsep, ap, basal HK. Mod MR, mod dil LA, mod RV dil w/ mildly reduced fxn, mod dil RA, mod TR, PASP 40mmHg.  [name] Chronic respiratory failure (HCC)    a. on home O2  . COPD (chronic  obstructive pulmonary disease) ([place])    a. Home O2 - 3lpm  . Gastritis   . History of intracranial hemorrhage    a. 6/14 described by neurosurgery as "small frontal posttraumatic SAH " - pt was on xarelto @ the time.  . Hyperlipidemia   . Hypertension   . Lipoma of back   . Lymphedema    a. Chronic LLE edema.  [name] NICM (nonischemic cardiomyopathy) ([place])    a. 09/2017 Echo: EF 40-45%; b. 09/2017 Cath: minor, insignificant CAD.  [name] Obesity   . PAH (pulmonary artery hypertension) ([place])    a. 09/2017 Echo: PASP 39mmHg; b. 09/2017 RHC: RA 23, RV 84/13, PA 84/29, PCWP 20, CO 3.89, CI 1.89. LVEDP 18.  . Sepsis with metabolic encephalopathy ([place])   . Sleep apnea   . Streptococcal bacteremia   . Stroke ([place])   . Symptomatic bradycardia    a. s/p BSX PPM.  . Thyroid nodule   . Type II diabetes mellitus ([place])   . Urinary incontinence     Past Surgical History:  Procedure Laterality Date  . ABDOMINAL HYSTERECTOMY  1969  .  CARDIAC CATHETERIZATION  2013   @ [place]: No obstructive CAD: Only 20% ostial left circumflex.  [name] CARDIOVERSION N/A 10/12/2017   Procedure: CARDIOVERSION;  Surgeon: Minna Merritts, MD;  Location: [facility] ORS;  Service: Cardiovascular;  Laterality: N/A;  . CHOLECYSTECTOMY    . CYST REMOVAL TRUNK  2002   BACK  . DIALYSIS/PERMA CATHETER INSERTION N/A 10/06/2017   Procedure: DIALYSIS/PERMA CATHETER INSERTION;  Surgeon: Katha Cabal, MD;  Location: [place] CV LAB;  Service: Cardiovascular;  Laterality: N/A;  . INSERT / REPLACE / REMOVE PACEMAKER    . lipoma removal  2014   back  . PACEMAKER INSERTION  [address] Scientific dual chamber pacemaker implanted by Dr Caryl Comes for symptomatic bradycardia  . PERMANENT PACEMAKER INSERTION N/A 01/09/2014   Procedure: PERMANENT PACEMAKER INSERTION;  Surgeon: Deboraha Sprang, MD;  Location: [place] CATH LAB;  Service: Cardiovascular;  Laterality: N/A;  . RIGHT/LEFT HEART CATH AND CORONARY ANGIOGRAPHY N/A 10/19/2017   Procedure: RIGHT/LEFT  HEART CATH AND CORONARY ANGIOGRAPHY;  Surgeon: Wellington [place], MD;  Location: [place] CV LAB;  Service: Cardiovascular;  Laterality: N/A;  . TEE WITHOUT CARDIOVERSION N/A 08/20/2017   Procedure: TRANSESOPHAGEAL ECHOCARDIOGRAM (TEE) & Direct current cardioversion;  Surgeon: Minna Merritts, MD;  Location: [facility] ORS;  Service: Cardiovascular;  Laterality: N/A;  . TRIGGER FINGER RELEASE    . VAGINAL HYSTERECTOMY       Home Meds: Prior to Admission medications   Medication Sig Start Date End Date Taking? Authorizing Provider  acetaminophen (TYLENOL) 325 MG tablet Take 325-650 mg by mouth every 6 (six) hours as needed for mild pain. Reported on 10/10/2015   Yes [provider]  albuterol (PROVENTIL) (2.5 MG/3ML) 0.083% nebulizer solution Take 3 mLs (2.5 mg total) by nebulization every 6 (six) hours. 08/28/17  Yes Wieting, Richard, MD  amiodarone (PACERONE) 200 MG tablet Take 1 tablet (200 mg total) by mouth daily. 01/15/18  Yes Theora Gianotti, NP  apixaban (ELIQUIS) 5 MG TABS tablet Take 1 tablet (5 mg total) by mouth 2 (two) times daily. 11/09/17  Yes Wellington Hampshire, MD  carvedilol (COREG) 3.125 MG tablet Take 1 tablet (3.125 mg total) by mouth 2 (two) times daily with a meal. 02/23/18  Yes Theora Gianotti, NP  gabapentin (NEURONTIN) 100 MG capsule TAKE 2 CAPSULES BY MOUTH THREE TIMES DAILY 08/12/17  Yes [name], MD  glucose blood (TRUE METRIX BLOOD GLUCOSE TEST) test strip Check blood glucose twice daily Dx:E11.8, Z79.4 09/17/17  Yes [name], MD  losartan (COZAAR) 25 MG tablet Take 1 tablet (25 mg total) by mouth daily. 01/15/18  Yes Theora Gianotti, NP  Menthol, Topical Analgesic, (BIOFREEZE EX) Apply 1 application topically as needed (for pain).    Yes [provider]  metFORMIN (GLUCOPHAGE) 500 MG tablet Take 500 mg by mouth 2 (two) times daily with a meal.    Yes [provider]  potassium chloride SA  (K-DUR,KLOR-CON) 20 MEQ tablet Take 1 tablet (20 mEq total) by mouth 2 (two) times daily. 12/04/17  Yes Theora Gianotti, NP  rosuvastatin (CRESTOR) 10 MG tablet Take 1 tablet (10 mg total) by mouth daily. 11/30/17  Yes [name], MD  sildenafil (REVATIO) 20 MG tablet Take 20 mg by mouth 3 (three) times daily. 05/20/17  Yes [provider]  spironolactone (ALDACTONE) 25 MG tablet Take 0.5 tablets (12.5 mg total) by mouth daily. 01/07/18 04/07/18 Yes Theora Gianotti, NP  torsemide (  DEMADEX) 20 MG tablet Take 1 tablet (20 mg total) by mouth daily. 01/15/18 03/08/18 Yes Theora Gianotti, NP  [name] 4.09 WJ/1.9JY SOPN INJECT 0.75MG  SUBCUTANEOUSLY ONCE A WEEK 12/22/17  Yes [name], MD  ondansetron (ZOFRAN) 8 MG tablet Take 1 tablet (8 mg total) by mouth every 8 (eight) hours as needed for nausea or vomiting. 09/25/14   Jackolyn Confer, MD    Inpatient Medications: Scheduled Meds: . amiodarone  200 mg Oral Daily  . apixaban  5 mg Oral BID  . carvedilol  3.125 mg Oral BID WC  . docusate sodium  100 mg Oral BID  . furosemide  40 mg Intravenous Q12H  . gabapentin  200 mg Oral TID  . insulin aspart  0-15 Units Subcutaneous TID WC  . insulin aspart  0-5 Units Subcutaneous QHS  . ipratropium-albuterol  3 mL Nebulization TID  . losartan  25 mg Oral Daily  . potassium chloride SA  20 mEq Oral BID  . rosuvastatin  10 mg Oral Daily  . sildenafil  20 mg Oral TID  . spironolactone  12.5 mg Oral Daily  . torsemide  20 mg Oral Daily   Continuous Infusions:  PRN Meds: acetaminophen **OR** acetaminophen, albuterol, bisacodyl, HYDROcodone-acetaminophen, ondansetron **OR** ondansetron (ZOFRAN) IV, traZODone  Allergies:   Allergies  Allergen Reactions  . Aspirin Hives and Other (See Comments)    Pt states that she is unable to take because she had bleeding in her brain.   Pt states that she is unable to take because she had bleeding in her  brain. Pt states that she is unable to take because she had bleeding in her brain. Pt states that she is unable to take because she had bleeding in her brain.  . Other Other (See Comments)    Pt states that she is unable to take blood thinners because she had bleeding in her brain.  Blood Thinners-per doctor at [place] states that she is unable to take blood thinners because she had bleeding in her brain.  Pt states that she is unable to take blood thinners because she had bleeding in her brain.  Blood Thinners-per doctor at [place] states that she is unable to take blood thinners because she had bleeding in her brain.  Blood Thinners-per doctor at [facility]  . Penicillins Hives, Shortness Of Breath, Swelling and Other (See Comments)    Has patient had a PCN reaction causing immediate rash, facial/tongue/throat swelling, SOB or lightheadedness with hypotension: Yes Has patient had a PCN reaction causing severe rash involving mucus membranes or skin necrosis: No Has patient had a PCN reaction that required hospitalization No Has patient had a PCN reaction occurring within the last 10 years: No If all of the above answers are "NO", then may proceed with Cephalosporin use.    Social History:   Social History   Socioeconomic History  . Marital status: Divorced    Spouse name: Not on file  . Number of children: 4  . Years of education: 79  . Highest education level: High school graduate  Occupational History  . Occupation: Retired- factory work    Comment: exposed to [place]  . Financial resource strain: Not hard at all  . Food insecurity:    Worry: Never true    Inability: Never true  . Transportation needs:    Medical: Yes    Non-medical: Yes  Tobacco Use  . Smoking status: Former  Smoker    Packs/day: 0.30    Years: 2.00    Pack years: 0.60    Types: Cigarettes    Last attempt to quit: 06/23/1990    Years since quitting: 27.7  . Smokeless  tobacco: Never Used  Substance and Sexual Activity  . Alcohol use: No  . Drug use: No  . Sexual activity: Not Currently  Lifestyle  . Physical activity:    Days per week: 0 days    Minutes per session: 0 min  . Stress: To some extent  Relationships  . Social connections:    Talks on phone: More than three times a week    Gets together: Once a week    Attends religious service: Never    Active member of club or organization: No    Attends meetings of clubs or organizations: Never    Relationship status: Divorced  . Intimate partner violence:    Fear of current or ex partner: Patient refused    Emotionally abused: Patient refused    Physically abused: Patient refused    Forced sexual activity: Patient refused  Other Topics Concern  . Not on file  Social History Narrative   Lives in [place] with daughter. Has 4 children. No pets.      Work - Restaurant manager, fast food, retired      Diet - regular     Family History:   Family History  Problem Relation Age of Onset  . Heart disease Mother   . Hypertension Mother   . COPD Mother        was a smoker  . Thyroid disease Mother   . Hypertension Father   . Hypertension Sister   . Thyroid disease Sister   . Hypertension Sister   . Thyroid disease Sister   . Breast cancer Maternal Aunt   . Kidney disease Neg Hx   . Bladder Cancer Neg Hx     ROS:  Review of Systems  Constitutional: Positive for malaise/fatigue. Negative for chills, diaphoresis, fever and weight loss.  HENT: Negative for congestion.   Eyes: Negative for discharge and redness.  Respiratory: Positive for cough and shortness of breath. Negative for hemoptysis, sputum production and wheezing.   Cardiovascular: Positive for orthopnea and leg swelling. Negative for chest pain, palpitations, claudication and PND.  Gastrointestinal: Negative for abdominal pain, blood in stool, heartburn, melena, nausea and vomiting.  Genitourinary: Negative for hematuria.  Musculoskeletal:  Negative for falls and myalgias.  Skin: Negative for rash.  Neurological: Positive for weakness. Negative for dizziness, tingling, tremors, sensory change, speech change, focal weakness and loss of consciousness.  Endo/Heme/Allergies: Does not bruise/bleed easily.  Psychiatric/Behavioral: Negative for substance abuse. The patient is not nervous/anxious.   All other systems reviewed and are negative.     Physical Exam/Data:   Vitals:   03/09/18 2217 03/10/18 0430 03/10/18 0500 03/10/18 0832  BP:  (!) 161/73  (!) 149/75  Pulse: 65 62  62  Resp: 18 19  16   Temp:  98.9 F (37.2 C)  98 F (36.7 C)  TempSrc:    Oral  SpO2: 98% 100%  100%  Weight:   98.8 kg 98.7 kg  Height:        Intake/Output Summary (Last 24 hours) at 03/10/2018 0928 Last data filed at 03/10/2018 0430 Gross per 24 hour  Intake 240 ml  Output 2400 ml  Net -2160 ml   Filed Weights   03/09/18 1200 03/10/18 0500 03/10/18 0832  Weight: 96.8 kg 98.8  kg 98.7 kg   Body mass index is 42.5 kg/m.   Physical Exam: General: Well developed, well nourished, in no acute distress. Head: Normocephalic, atraumatic, sclera non-icteric, no xanthomas, nares without discharge.  Neck: Negative for carotid bruits. JVD is difficult to assess secondary to body habitus. Lungs: Diminished breath sounds bilaterally with diffuse expiratory wheezing. On supplemental oxygen via nasal cannula at 4 L.  Heart: RRR with S1 S2. II/VI systolic murmur at the apex, no rubs, or gallops appreciated. Abdomen: Soft, non-tender, non-distended with normoactive bowel sounds. No hepatomegaly. No rebound/guarding. No obvious abdominal masses. Msk:  Strength and tone appear normal for age. Extremities: No clubbing or cyanosis. 1-2+ LLE edema. Distal pedal pulses are 2+ and equal bilaterally. Neuro: Alert and oriented X 3. No facial asymmetry. No focal deficit. Moves all extremities spontaneously. Psych:  Responds to questions appropriately with a normal  affect.   EKG:  The EKG was personally reviewed and demonstrates: sinus rhythm, 68 bpm, diffuse P wave abnormality  Telemetry:  Telemetry was personally reviewed and demonstrates: sinus  Weights: Filed Weights   03/09/18 1200 03/10/18 0500 03/10/18 0832  Weight: 96.8 kg 98.8 kg 98.7 kg    Relevant CV Studies: 2-D echo 03/09/2018: Study Conclusions  - Left ventricle: The cavity size was normal. Wall thickness was   normal. Systolic function was normal. The estimated ejection   fraction was in the range of 55% to 60%. Doppler parameters are   consistent with abnormal left ventricular relaxation (grade 1   diastolic dysfunction). Doppler parameters are consistent with   high ventricular filling pressure. - Aortic valve: Trileaflet; mildly thickened, moderately calcified   leaflets. Sclerosis without stenosis. - Mitral valve: There was mild to moderate regurgitation. Valve   area by continuity equation (using LVOT flow): 1.57 cm^2. - Left atrium: The atrium was mildly dilated. - Right ventricle: The cavity size was normal. Systolic function   was mildly reduced. - Tricuspid valve: There was mild regurgitation. - Pulmonary arteries: Systolic pressure was mildly increased, in   the range of 35 mm Hg to 40 mm Hg. PA peak pressure: 46 mm Hg   (S). __________  [facility] 09/2017: Conclusion   1.  Minor irregularities with no significant coronary artery disease. 2.  Left ventricular angiography was not performed when recent acute renal failure.  EF was mildly reduced by echo. 3.  Right heart catheterization showed mildly elevated filling pressures, severe pulmonary hypertension and severely reduced cardiac output. RA pressure: 23 mmHg, RV pressure: 84 over 13 mmHg, PA pressure: 84/29 mmHg pulmonary capillary wedge pressure is 20 mmHg, LVEDP was 18 mmHg, cardiac output: 3.89 with a cardiac index of 1.89.  Recommendations: The patient has severe pulmonary hypertension that seems to be out  of proportion to left heart failure.  I resumed small dose torsemide 20 mg once daily. Resume Eliquis 5 mg twice daily tomorrow if no bleeding complications. The patient will need to be referred to advanced heart failure clinic in [facility] after hospital discharge to manage severe pulmonary hypertension.     Laboratory Data:  Chemistry Recent Labs  Lab 03/08/18 2148 03/09/18 0428  NA 138 142  K 3.8 3.4*  CL 105 105  CO2 27 28  GLUCOSE 120* 103*  BUN 18 16  CREATININE 0.93 0.85  CALCIUM 9.0 9.0  GFRNONAA 59* >60  GFRAA >60 >60  ANIONGAP 6 9    No results for input(s): PROT, ALBUMIN, AST, ALT, ALKPHOS, BILITOT in the last 168 hours. Hematology Recent  Labs  Lab 03/08/18 2148 03/09/18 0428  WBC 6.1 5.8  RBC 4.32 3.85  HGB 11.7* 10.6*  HCT 36.5 32.5*  MCV 84.6 84.4  MCH 27.1 27.6  MCHC 32.0 32.7  RDW 14.8* 15.0*  PLT 202 189   Cardiac Enzymes Recent Labs  Lab 03/08/18 2148 03/09/18 0428  TROPONINI 0.03* 0.12*   No results for input(s): TROPIPOC in the last 168 hours.  BNPNo results for input(s): BNP, PROBNP in the last 168 hours.  DDimer No results for input(s): DDIMER in the last 168 hours.  Radiology/Studies:  Dg Chest Portable 1 View  Result Date: 03/08/2018 IMPRESSION: Cardiomegaly with vascular congestion and moderate diffuse interstitial and ground-glass opacity suspicious for pulmonary edema Electronically Signed   By: Donavan Foil M.D.   On: 03/08/2018 22:17    Assessment and Plan:   1. Acute on chronic combined CHF/pulmonary hypertension: -She continues to appear volume up -Continue IV Lasix 40 mg bid with KCl repletion -Continue Revatio   -She will need her advanced heart failure clinic appointment rescheduled in [place]  2. Elevated troponin with nonobstructive CAD/right-sided chest pain: -Recent LHC in 09/2017 without obstructive disease -No plans for repeat ischemic evaluation at this time -Cannot exclude possible PE given her noncompliance  with Eliquis with prior DVT -If IM feels indicated, consider CTA chest to evaluate for PE  3. Persistent Afib: -Sinus rhythm -Compliance with Eliquis is encouraged  -Continue Coreg, amiodarone and Eliquis -Check LFTs and thyroid function  4. Acute on chronic respiratory failure: -Remains on her baseline 4 L via [place] -Likely multifactorial including acute on chronic diastolic CHF, pulmonary hypertension, and AECOPD -COPD per IM  5. Hypokalemia: -Replete to goal 4.0  6. HTN: -Continue current medications    For questions or updates, please contact [place] Please consult [email or web address] for contact info under Cardiology/STEMI.   Signed, Christell Faith, PA-C [place] Pager: [phone] 03/10/2018, 9:28 AM

## 2018-03-10 NOTE — Patient Outreach (Signed)
[facility]) Care Management  03/10/2018  KAYLEA MOUNSEY [birth year] [id number]  72 y.o. year old female referred to [facility] services for Care Coordination (Eliquis)  Care coordination call received from [facility] regarding patient's Eliquis. After speaking to patient, Truddie Crumble discovered that Ms. Punches had been out of Eliquis for at least one week.   Medication Assistance:  Currently trying to get patient enrolled in MAPs for [name] and Eliquis. We are still requiring some documentation. Called Dr. Tyrell Antonio office to ask for Eliquis samples for patient so that she does not go without again.   Ruben Reason, PharmD Clinical Pharmacist, [facility] [phone]

## 2018-03-11 ENCOUNTER — Other Ambulatory Visit: Payer: Self-pay

## 2018-03-11 LAB — BASIC METABOLIC PANEL
Anion gap: 10 (ref 5–15)
BUN: 26 mg/dL — ABNORMAL HIGH (ref 8–23)
CO2: 29 mmol/L (ref 22–32)
Calcium: 9.4 mg/dL (ref 8.9–10.3)
Chloride: 98 mmol/L (ref 98–111)
Creatinine, Ser: 0.96 mg/dL (ref 0.44–1.00)
GFR calc Af Amer: >60 mL/min (ref >60)
GFR calc non Af Amer: 57 mL/min — ABNORMAL LOW (ref >60)
Glucose, Bld: 134 mg/dL — ABNORMAL HIGH (ref 70–99)
Potassium: 4.2 mmol/L (ref 3.5–5.1)
Sodium: 137 mmol/L (ref 135–145)

## 2018-03-11 LAB — HEPATIC FUNCTION PANEL
ALT: 18 U/L (ref 0–44)
AST: 20 U/L (ref 15–41)
Albumin: 3.8 g/dL (ref 3.5–5.0)
Alkaline Phosphatase: 103 U/L (ref 38–126)
Bilirubin, Direct: <0.1 mg/dL (ref 0.0–0.2)
Total Bilirubin: 0.6 mg/dL (ref 0.3–1.2)
Total Protein: 7.7 g/dL (ref 6.5–8.1)

## 2018-03-11 LAB — GLUCOSE, CAPILLARY
Glucose-Capillary: 114 mg/dL — ABNORMAL HIGH (ref 70–99)
Glucose-Capillary: 155 mg/dL — ABNORMAL HIGH (ref 70–99)
Glucose-Capillary: 271 mg/dL — ABNORMAL HIGH (ref 70–99)
Glucose-Capillary: 197 mg/dL — ABNORMAL HIGH (ref 70–99)

## 2018-03-11 MED ORDER — TORSEMIDE 20 MG PO TABS
20.0000 mg | ORAL_TABLET | Freq: Every day | ORAL | Status: DC
  Administered 2018-03-12: 20 mg via ORAL
  Filled 2018-03-11: qty 1

## 2018-03-11 NOTE — Patient Outreach (Signed)
[facility]) Care Management  03/11/2018  LUCILLIA CORSON 03-Jul-1945 [id number]  Care Coordination: Successful telephone to the above patient while inpatient at [facility] to discuss Eliquis samples needing to be picked up at Dr. Tyrell Antonio office prior to discharge from hospital. RN CM was notified by Ruben Reason, [facility] that samples were available.  Patient states daughter is visiting with her now and will pick up samples before 5:00 pm today. RN CM stressed importance of samples needing to be picked up prior to end of business day tomorrow in the event patient was discharge to home over the weekend. Patient verbalized understanding.   RN CM confirmed with patient that she still had RN CM business card from visit earlier this week. Patient will inform daughter to call RN CM if samples not available at front desk as indicated by Cardiology office.  Plan: Will follow-up with patient telephonically post discharge  Patricio Popwell E. [name] RN, BSN [facility] Care Management Coordinator [phone]

## 2018-03-11 NOTE — Progress Notes (Signed)
Progress Note  Patient Name: Debra Saunders Date of Encounter: 03/11/2018  Primary Cardiologist: Fletcher Anon  Subjective   SOB improving. Right-side chest pain resolved. BUN/SCr slightly up trending with IV diuresis with a trend of 16/0.85-->26/0.96. Potassium repleted at 4.2. Documented UOP of 220 mL for the past 24 hours with a net - of 5.1 L for the admission. Weight 98.8-->98.1 kg (dry weight ~ 210 pounds/95.2 kg). BP improved to the 492E to 100F systolic.   Has already received IV Lasix 40 mg this morning.   Inpatient Medications    Scheduled Meds: . amiodarone  200 mg Oral Daily  . apixaban  5 mg Oral BID  . carvedilol  3.125 mg Oral BID WC  . docusate sodium  100 mg Oral BID  . furosemide  40 mg Intravenous BID  . gabapentin  200 mg Oral TID  . insulin aspart  0-15 Units Subcutaneous TID WC  . insulin aspart  0-5 Units Subcutaneous QHS  . ipratropium-albuterol  3 mL Nebulization TID  . losartan  25 mg Oral Daily  . methylPREDNISolone (SOLU-MEDROL) injection  40 mg Intravenous QAC breakfast  . potassium chloride SA  20 mEq Oral BID  . rosuvastatin  10 mg Oral Daily  . sildenafil  20 mg Oral TID  . spironolactone  12.5 mg Oral Daily   Continuous Infusions:  PRN Meds: acetaminophen **OR** acetaminophen, albuterol, bisacodyl, HYDROcodone-acetaminophen, ondansetron **OR** ondansetron (ZOFRAN) IV, traZODone   Vital Signs    Vitals:   03/11/18 0431 03/11/18 0726 03/11/18 0730 03/11/18 0750  BP: 136/75   140/80  Pulse: 60   61  Resp: (!) 22     Temp: (!) 97.5 F (36.4 C)     TempSrc: Oral     SpO2: 98% 98% 98% 100%  Weight: 98.1 kg     Height:        Intake/Output Summary (Last 24 hours) at 03/11/2018 0803 Last data filed at 03/11/2018 0431 Gross per 24 hour  Intake 480 ml  Output 700 ml  Net -220 ml   Filed Weights   03/10/18 0500 03/10/18 0832 03/11/18 0431  Weight: 98.8 kg 98.7 kg 98.1 kg    Telemetry    Paced - Personally Reviewed  ECG    n/a -  Personally Reviewed  Physical Exam   GEN: No acute distress.   Neck: No JVD. Cardiac: RRR, no murmurs, rubs, or gallops.  Respiratory: Diminished, though improved breath sounds bilaterally.  GI: Soft, nontender, non-distended.   MS: Trace to 1+ left lower extremity edema; No deformity. Neuro:  Alert and oriented x 3; Nonfocal.  Psych: Normal affect.  Labs    Chemistry Recent Labs  Lab 03/08/18 2148 03/09/18 0428 03/11/18 0422  NA 138 142 137  K 3.8 3.4* 4.2  CL 105 105 98  CO2 27 28 29   GLUCOSE 120* 103* 134*  BUN 18 16 26*  CREATININE 0.93 0.85 0.96  CALCIUM 9.0 9.0 9.4  GFRNONAA 59* >60 57*  GFRAA >60 >60 >60  ANIONGAP 6 9 10      Hematology Recent Labs  Lab 03/08/18 2148 03/09/18 0428  WBC 6.1 5.8  RBC 4.32 3.85  HGB 11.7* 10.6*  HCT 36.5 32.5*  MCV 84.6 84.4  MCH 27.1 27.6  MCHC 32.0 32.7  RDW 14.8* 15.0*  PLT 202 189    Cardiac Enzymes Recent Labs  Lab 03/08/18 2148 03/09/18 0428  TROPONINI 0.03* 0.12*   No results for input(s): TROPIPOC in the last  168 hours.   BNPNo results for input(s): BNP, PROBNP in the last 168 hours.   DDimer No results for input(s): DDIMER in the last 168 hours.   Radiology    No results found.  Cardiac Studies   2-D echo 03/09/2018: Study Conclusions  - Left ventricle: The cavity size was normal. Wall thickness was normal. Systolic function was normal. The estimated ejection fraction was in the range of 55% to 60%. Doppler parameters are consistent with abnormal left ventricular relaxation (grade 1 diastolic dysfunction). Doppler parameters are consistent with high ventricular filling pressure. - Aortic valve: Trileaflet; mildly thickened, moderately calcified leaflets. Sclerosis without stenosis. - Mitral valve: There was mild to moderate regurgitation. Valve area by continuity equation (using LVOT flow): 1.57 cm^2. - Left atrium: The atrium was mildly dilated. - Right ventricle: The cavity  size was normal. Systolic function was mildly reduced. - Tricuspid valve: There was mild regurgitation. - Pulmonary arteries: Systolic pressure was mildly increased, in the range of 35 mm Hg to 40 mm Hg. PA peak pressure: 46 mm Hg (S). __________  [facility] 09/2017: Conclusion   1. Minor irregularities with no significant coronary artery disease. 2. Left ventricular angiography was not performed when recent acute renal failure. EF was mildly reduced by echo. 3. Right heart catheterization showed mildly elevated filling pressures, severe pulmonary hypertension and severely reduced cardiac output. RA pressure: 23 mmHg, RV pressure: 84 over 13 mmHg, PA pressure: 84/29 mmHg pulmonary capillary wedge pressure is 20 mmHg, LVEDP was 18 mmHg, cardiac output: 3.89 with a cardiac index of 1.89.  Recommendations: The patient has severe pulmonary hypertension that seems to be out of proportion to left heart failure. I resumed small dose torsemide 20 mg once daily. Resume Eliquis 5 mg twice daily tomorrow if no bleeding complications. The patient will need to be referred to advanced heart failure clinic in [facility] after hospital discharge to manage severe pulmonary hypertension.  __________  Patient Profile     72 y.o. female with history of nonobstructive CADby cath in 09/2017, chronic combined systolic and diastolicCHF, persistentAfib on Eliquis, symptomatic bradycardia status post permanent pacemaker, frequent PVCs, chronic respiratory failure with hypoxia on home O2secondary toCOPD,DM2, HTN,HLD,obesity, traumatic subarachnoid hemorrhage in the setting of Xarelto, left lower extremity DVT status post IVC filter, chronic right-sided atypical chest pain, chronic left lower extremity lymphedema, prior stroke, sleep apnea, cardiorenal syndrome, and pulmonary hypertension who is being seen today for the evaluation of SOB at the request of Dr. Vianne Bulls.  Assessment & Plan    1. Acute  on chronic diastolic CHF/pulmonary hypertension: -Improving -Transition from IV Lasix to torsemide 20 mg daily (to be started on 9/20) with a prn dose for weight gain > 2 pounds overnight or > 5 pounds in a 1 week time span  -Daily weights -Strict Is and Os -CHF education -Compliance with diet and medication advised  -Continue Revatio -Will need to reschedule her appointment with [facility] (was for 9/18) -Ambulate to assess symptoms   2. Elevated troponin with nonobstructive CAD: -Recent LHC in 09/2017 without obstructive disease -No plans for repeat ischemic evaluation at this time -Right-sided chest pain resolved  3. Persistent Afib: -Currently atrial paced -Continue amiodarone and Coreg -Compliance with Eliquis is advised  -Recent TSH normal -Check LFTs  4. Acute on chronic respiratory failure: -On her baseline 4 L via nasal cannula  -Consider PFTs as an outpatient given amiodarone usage   5. Hypokalemia: -Repleted   6. HTN: -Improving with diuresis -Continue  current medications including Coreg, losartan, spironolactone    For questions or updates, please contact [place] Please consult [email or web address] for contact info under Cardiology/STEMI.    Signed, Christell Faith, PA-C [place] Pager: [phone] 03/11/2018, 8:03 AM

## 2018-03-11 NOTE — Progress Notes (Signed)
Patient ID: Debra Saunders, female   DOB: 10-12-1945, 72 y.o.   MRN: [id number]  [place] PROGRESS NOTE  Debra Saunders XFG:[id number] DOB: [birth year] DOA: 03/08/2018 PCP: [name], MD  HPI/Subjective: Patient feeling a little bit better.  Some shortness of breath.  Able to take a little deeper breath than yesterday.  States cost was an issue with taking her Eliquis.  Objective: Vitals:   03/11/18 1237 03/11/18 1341  BP: (!) 168/90   Pulse:    Resp:    Temp:    SpO2:  96%    Filed Weights   03/10/18 0832 03/11/18 0431 03/11/18 1145  Weight: 98.7 kg 98.1 kg 96 kg    ROS: Review of Systems  Constitutional: Negative for chills and fever.  Eyes: Negative for blurred vision.  Respiratory: Positive for shortness of breath. Negative for cough.   Cardiovascular: Positive for chest pain.  Gastrointestinal: Negative for abdominal pain, constipation, diarrhea, nausea and vomiting.  Genitourinary: Negative for dysuria.  Musculoskeletal: Negative for joint pain.  Neurological: Negative for dizziness and headaches.   Exam: Physical Exam  Constitutional: She is oriented to person, place, and time.  HENT:  Nose: No mucosal edema.  Mouth/Throat: No oropharyngeal exudate or posterior oropharyngeal edema.  Eyes: Pupils are equal, round, and reactive to light. Conjunctivae, EOM and lids are normal.  Neck: No JVD present. Carotid bruit is not present. No edema present. No thyroid mass and no thyromegaly present.  Cardiovascular: S1 normal and S2 normal. Exam reveals no gallop.  No murmur heard. Pulses:      Dorsalis pedis pulses are 1+ on the right side, and 1+ on the left side.  Respiratory: No respiratory distress. She has decreased breath sounds in the right lower field and the left lower field. She has no wheezes. She has no rhonchi. She has rales in the right lower field and the left lower field.  GI: Soft. Bowel sounds are normal. There is no tenderness.   Musculoskeletal:       Right ankle: She exhibits no swelling.       Left ankle: She exhibits swelling.  Lymphadenopathy:    She has no cervical adenopathy.  Neurological: She is alert and oriented to person, place, and time. No cranial nerve deficit.  Skin: Skin is warm. No rash noted. Nails show no clubbing.  Psychiatric: She has a normal mood and affect.      Data Reviewed: Basic Metabolic Panel: Recent Labs  Lab 03/08/18 2148 03/09/18 0428 03/11/18 0422  NA 138 142 137  K 3.8 3.4* 4.2  CL 105 105 98  CO2 27 28 29   GLUCOSE 120* 103* 134*  BUN 18 16 26*  CREATININE 0.93 0.85 0.96  CALCIUM 9.0 9.0 9.4   CBC: Recent Labs  Lab 03/08/18 2148 03/09/18 0428  WBC 6.1 5.8  HGB 11.7* 10.6*  HCT 36.5 32.5*  MCV 84.6 84.4  PLT 202 189   Cardiac Enzymes: Recent Labs  Lab 03/08/18 2148 03/09/18 0428  TROPONINI 0.03* 0.12*   BNP (last 3 results) Recent Labs    09/30/17 1545 10/28/17 0350 11/05/17 1514  BNP 1,151.0* 290.0* 989.0*    CBG: Recent Labs  Lab 03/10/18 1131 03/10/18 1640 03/10/18 2102 03/11/18 0725 03/11/18 1136  GLUCAP 121* 192* 204* 114* 155*    Recent Results (from the past 240 hour(s))  MRSA PCR Screening     Status: None   Collection Time: 03/09/18 12:58 AM  Result Value Ref Range Status  MRSA by PCR NEGATIVE NEGATIVE Final    Comment:        [name] (FDA approved for NASAL specimens only), is one component of a comprehensive MRSA colonization surveillance program. It is not intended to diagnose MRSA infection nor to guide or monitor treatment for MRSA infections. Performed at [facility], [address] [place], [place] [address]      Scheduled Meds: . amiodarone  200 mg Oral Daily  . apixaban  5 mg Oral BID  . carvedilol  3.125 mg Oral BID WC  . docusate sodium  100 mg Oral BID  . gabapentin  200 mg Oral TID  . insulin aspart  0-15 Units Subcutaneous TID WC  . insulin aspart  0-5 Units  Subcutaneous QHS  . ipratropium-albuterol  3 mL Nebulization TID  . losartan  25 mg Oral Daily  . methylPREDNISolone (SOLU-MEDROL) injection  40 mg Intravenous QAC breakfast  . rosuvastatin  10 mg Oral Daily  . sildenafil  20 mg Oral TID  . spironolactone  12.5 mg Oral Daily  . [START ON 03/12/2018] torsemide  20 mg Oral Daily   Continuous Infusions:  Assessment/Plan:  1. Acute on chronic combined systolic and diastolic congestive heart failure with moderate mitral regurgitation.  Ejection fraction better now than it was in the past.  Diuresis with IV Lasix.  Continue Spironolactone losartan Coreg.  Potential discharge tomorrow 2. Chest wall costochondritis.  Solu-Medrol daily.  Allergy to anti-inflammatories. 3. Paroxysmal atrial fibrillation on amiodarone and Eliquis 4. Hypertension continue usual medications 5. Hyperlipidemia unspecified on Crestor 6. History of pulmonary hypertension on sildenafil 7. Type 2 diabetes mellitus on sliding scale 8. Weakness.  Physical therapy recommends home with home health.  Code Status:     Code Status Orders  (From admission, onward)         Start     Ordered   03/09/18 0051  Full code  Continuous     03/09/18 0050        Code Status History    Date Active Date Inactive Code Status Order ID Comments User Context   09/30/2017 1901 10/22/2017 2001 Full Code [id number]  Gorden Harms, MD Inpatient   08/16/2017 1614 08/28/2017 2239 Full Code [id number]  Gorden Harms, MD Inpatient   03/30/2015 2152 04/03/2015 2122 Full Code [id number]  Etta Quill, DO Inpatient   03/27/2015 2208 03/28/2015 1737 Full Code [id number]  Nicholes Mango, MD ED   01/09/2014 1418 01/10/2014 1803 Full Code [id number]  Deboraha Sprang, MD Inpatient   01/16/2013 2335 01/27/2013 2011 Full Code [id number]  Collene Gobble, MD Inpatient    Advance Directive Documentation     Most Recent Value  Type of Advance Directive  Healthcare Power of Attorney  Pre-existing out of  facility DNR order (yellow form or pink MOST form)  -  "MOST" Form in Place?  -      Disposition Plan: Potential home tomorrow with CHF [facility] home health program  Time spent: 28 minutes   [place]

## 2018-03-11 NOTE — Care Management Important Message (Signed)
Copy of signed IM left with patient in room.  

## 2018-03-11 NOTE — Telephone Encounter (Signed)
Patient's family member came by to pick up samples. Samples given.  Medication Samples have been provided to the patient.  Drug name: Eliquis       Strength: 5mg         Qty: 4 boxes  LOT: [id number]  Exp.Date: June 2021

## 2018-03-12 ENCOUNTER — Telehealth: Payer: Self-pay

## 2018-03-12 LAB — GLUCOSE, CAPILLARY
Glucose-Capillary: 105 mg/dL — ABNORMAL HIGH (ref 70–99)
Glucose-Capillary: 216 mg/dL — ABNORMAL HIGH (ref 70–99)

## 2018-03-12 MED ORDER — TORSEMIDE 20 MG PO TABS: 20.0000 mg | ORAL_TABLET | Freq: Two times a day (BID) | ORAL | 0 refills | Status: DC

## 2018-03-12 MED ORDER — APIXABAN 5 MG PO TABS: 5.0000 mg | ORAL_TABLET | Freq: Two times a day (BID) | ORAL | 0 refills | Status: DC

## 2018-03-12 NOTE — Care Management Note (Addendum)
Case Management Note  Patient Details  Name: Debra Saunders MRN: [id number] Date of Birth: [birth year]   Patient to discharge home today.  Family to transport at discharge and bring portable O2 at discharge.  Patient to have home health with [facility], heart failure protocol and [place] with [facility] provided signed protocol and notified of discharge. Patient on Eliquis outpatient.  Retail  Price for her $530.  With her insurance it is $116 per month.  Patient does expresses that at times it is difficult to afford.  Per MD cardiology has provided patient with samples.  Patient has already used a 30 day trail coupon.  RNCM informed patient about patient assistance program through Eliquis.   Patient states that her pharmacist outpatient is already assisting with this. RNCM signing off   Subjective/Objective:                    Action/Plan:   Expected Discharge Date:  03/12/18               Expected Discharge Plan:  [place]  In-House Referral:     Discharge planning Services  CM Consult  Post Acute Care Choice:  Home Health Choice offered to:  Patient  DME Arranged:    DME Agency:     HH Arranged:  RN, PT [place] Agency:  [facility] (formerly [employer])  Status of Service:  Completed, signed off  If discussed at [employer], dates discussed:    Additional Comments:  Beverly Sessions, RN 03/12/2018, 1:36 PM

## 2018-03-12 NOTE — Discharge Summary (Signed)
[facility] [place] NAME: Debra Saunders    MR#:  [id number]  DATE OF BIRTH:  1945/10/09  DATE OF ADMISSION:  03/08/2018 ADMITTING PHYSICIAN: Amelia Jo, MD  DATE OF DISCHARGE: 03/12/2018  PRIMARY CARE PHYSICIAN: [name], MD    ADMISSION DIAGNOSIS:  Shortness of breath [R06.02] Acute on chronic congestive heart failure, unspecified heart failure type ([place]) [I50.9]  DISCHARGE DIAGNOSIS:  Active Problems:   Acute respiratory failure ([place])   SECONDARY DIAGNOSIS:   Past Medical History:  Diagnosis Date  . Arthritis   . Atrial fibrillation, persistent ([place])    a. s/p TEE-guided DCCV on 08/20/2017  . Cardiorenal syndrome    a. 09/2017 Creat rose to 2.7 w/ diuresis-->HD x 2.  . Chest pain    a. 01/2012 Cath: nonobs dzs;  c. 04/2014 MV: No ischemia.  . Chronic back pain   . Chronic combined systolic (congestive) and diastolic (congestive) heart failure ([place])    a. Dx 2005 @ [facility];  b. 02/2014 Echo: Ef 55-60%, no rwma, mild MR, PASP 62mmHg. c. 07/2017: EF 30-35%, diffuse HK, trivial AI, severe MR, moderate TR; d. 09/2017 Echo: EF 40-45%, ant, antsep, ap, basal HK. Mod MR, mod dil LA, mod RV dil w/ mildly reduced fxn, mod dil RA, mod TR, PASP 43mmHg.  [name] Chronic respiratory failure (HCC)    a. on home O2  . COPD (chronic obstructive pulmonary disease) ([place])    a. Home O2 - 3lpm  . Gastritis   . History of intracranial hemorrhage    a. 6/14 described by neurosurgery as "small frontal posttraumatic SAH " - pt was on xarelto @ the time.  . Hyperlipidemia   . Hypertension   . Lipoma of back   . Lymphedema    a. Chronic LLE edema.  [name] NICM (nonischemic cardiomyopathy) ([place])    a. 09/2017 Echo: EF 40-45%; b. 09/2017 Cath: minor, insignificant CAD.  [name] Obesity   . PAH (pulmonary artery hypertension) ([place])    a. 09/2017 Echo: PASP 42mmHg; b. 09/2017 RHC: RA 23, RV 84/13, PA 84/29, PCWP 20, CO 3.89, CI 1.89. LVEDP 18.  . Sepsis with metabolic  encephalopathy ([place])   . Sleep apnea   . Streptococcal bacteremia   . Stroke ([place])   . Symptomatic bradycardia    a. s/p BSX PPM.  . Thyroid nodule   . Type II diabetes mellitus ([place])   . Urinary incontinence     HOSPITAL COURSE:   1.  Acute on chronic combined systolic and diastolic congestive heart failure with moderate mitral regurgitation.  Ejection fraction now better than what it was in the past.  The patient was diuresed with IV Lasix here in the hospital.  Switched over to torsemide 20 mg p.o. twice daily upon discharge.  Continue spironolactone, losartan and Coreg.  Set up for home health CHF vest program, CHF clinic.  Follow-up with cardiology as outpatient. 2.  Chest wall costochondritis.  Given Solu-Medrol here.  Patient allergic to anti-inflammatories.  Patient did not want any prednisone upon going home.  States her chest wall is better. 3.  Paroxysmal atrial fibrillation on amiodarone and Eliquis.  Patient was not taking her Eliquis as outpatient because it was very expensive.  Cardiology gave her some samples. 4.  Essential hypertension continue usual medications 5.  Hyperlipidemia unspecified on Crestor 6.  Pulmonary hypertension on sildenafil 7.  Type 2 diabetes mellitus on [name] and metformin 8.  Weakness.  Physical therapy  recommended home with home health 9.  Chronic respiratory failure on 4 L  DISCHARGE CONDITIONS:   Satisfactory  CONSULTS OBTAINED:  Treatment Team:  Nelva Bush, MD  DRUG ALLERGIES:   Allergies  Allergen Reactions  . Aspirin Hives and Other (See Comments)    Pt states that she is unable to take because she had bleeding in her brain.   Pt states that she is unable to take because she had bleeding in her brain. Pt states that she is unable to take because she had bleeding in her brain. Pt states that she is unable to take because she had bleeding in her brain.  . Other Other (See Comments)    Pt states that she is unable to  take blood thinners because she had bleeding in her brain.  Blood Thinners-per doctor at [place] states that she is unable to take blood thinners because she had bleeding in her brain.  Pt states that she is unable to take blood thinners because she had bleeding in her brain.  Blood Thinners-per doctor at [place] states that she is unable to take blood thinners because she had bleeding in her brain.  Blood Thinners-per doctor at [facility]  . Penicillins Hives, Shortness Of Breath, Swelling and Other (See Comments)    Has patient had a PCN reaction causing immediate rash, facial/tongue/throat swelling, SOB or lightheadedness with hypotension: Yes Has patient had a PCN reaction causing severe rash involving mucus membranes or skin necrosis: No Has patient had a PCN reaction that required hospitalization No Has patient had a PCN reaction occurring within the last 10 years: No If all of the above answers are "NO", then may proceed with Cephalosporin use.    DISCHARGE MEDICATIONS:   Allergies as of 03/12/2018      Reactions   Aspirin Hives, Other (See Comments)   Pt states that she is unable to take because she had bleeding in her brain.   Pt states that she is unable to take because she had bleeding in her brain. Pt states that she is unable to take because she had bleeding in her brain. Pt states that she is unable to take because she had bleeding in her brain.   Other Other (See Comments)   Pt states that she is unable to take blood thinners because she had bleeding in her brain.  Blood Thinners-per doctor at [place] states that she is unable to take blood thinners because she had bleeding in her brain.  Pt states that she is unable to take blood thinners because she had bleeding in her brain.  Blood Thinners-per doctor at [place] states that she is unable to take blood thinners because she had bleeding in her brain.  Blood Thinners-per doctor at [employer], Shortness Of Breath, Swelling, Other (See Comments)   Has patient had a PCN reaction causing immediate rash, facial/tongue/throat swelling, SOB or lightheadedness with hypotension: Yes Has patient had a PCN reaction causing severe rash involving mucus membranes or skin necrosis: No Has patient had a PCN reaction that required hospitalization No Has patient had a PCN reaction occurring within the last 10 years: No If all of the above answers are "NO", then may proceed with Cephalosporin use.      Medication List    TAKE these medications   acetaminophen 325 MG tablet Commonly known as:  TYLENOL Take 325-650 mg by mouth every 6 (six) hours as  needed for mild pain. Reported on 10/10/2015   albuterol (2.5 MG/3ML) 0.083% nebulizer solution Commonly known as:  PROVENTIL Take 3 mLs (2.5 mg total) by nebulization every 6 (six) hours.   amiodarone 200 MG tablet Commonly known as:  PACERONE Take 1 tablet (200 mg total) by mouth daily.   apixaban 5 MG Tabs tablet Commonly known as:  ELIQUIS Take 1 tablet (5 mg total) by mouth 2 (two) times daily.   BIOFREEZE EX Apply 1 application topically as needed (for pain).   carvedilol 3.125 MG tablet Commonly known as:  COREG Take 1 tablet (3.125 mg total) by mouth 2 (two) times daily with a meal.   gabapentin 100 MG capsule Commonly known as:  NEURONTIN TAKE 2 CAPSULES BY MOUTH THREE TIMES DAILY   glucose blood test strip Check blood glucose twice daily Dx:E11.8, Z79.4   losartan 25 MG tablet Commonly known as:  COZAAR Take 1 tablet (25 mg total) by mouth daily.   metFORMIN 500 MG tablet Commonly known as:  GLUCOPHAGE Take 500 mg by mouth 2 (two) times daily with a meal.   ondansetron 8 MG tablet Commonly known as:  ZOFRAN Take 1 tablet (8 mg total) by mouth every 8 (eight) hours as needed for nausea or vomiting.   potassium chloride SA 20 MEQ tablet Commonly known as:  K-DUR,KLOR-CON Take 1 tablet (20 mEq  total) by mouth 2 (two) times daily.   rosuvastatin 10 MG tablet Commonly known as:  CRESTOR Take 1 tablet (10 mg total) by mouth daily.   sildenafil 20 MG tablet Commonly known as:  REVATIO Take 20 mg by mouth 3 (three) times daily.   spironolactone 25 MG tablet Commonly known as:  ALDACTONE Take 0.5 tablets (12.5 mg total) by mouth daily.   torsemide 20 MG tablet Commonly known as:  DEMADEX Take 1 tablet (20 mg total) by mouth 2 (two) times daily. What changed:  when to take this   [name] 5.63 OV/5.6EP Sopn Generic drug:  Dulaglutide INJECT 0.75MG  SUBCUTANEOUSLY ONCE A WEEK        DISCHARGE INSTRUCTIONS:   Follow-up PMD 6 days Follow-up cardiology 1 week Follow-up CHF clinic and home health CHF vest program  If you experience worsening of your admission symptoms, develop shortness of breath, life threatening emergency, suicidal or homicidal thoughts you must seek medical attention immediately by calling 911 or calling your MD immediately  if symptoms less severe.  You Must read complete instructions/literature along with all the possible adverse reactions/side effects for all the Medicines you take and that have been prescribed to you. Take any new Medicines after you have completely understood and accept all the possible adverse reactions/side effects.   Please note  You were cared for by a hospitalist during your hospital stay. If you have any questions about your discharge medications or the care you received while you were in the hospital after you are discharged, you can call the unit and asked to speak with the hospitalist on call if the hospitalist that took care of you is not available. Once you are discharged, your primary care physician will handle any further medical issues. Please note that NO REFILLS for any discharge medications will be authorized once you are discharged, as it is imperative that you return to your primary care physician (or establish a  relationship with a primary care physician if you do not have one) for your aftercare needs so that they can reassess your need for medications and monitor your  lab values.    Today   CHIEF COMPLAINT:   Chief Complaint  Patient presents with  . Shortness of Breath    HISTORY OF PRESENT ILLNESS:  Celisa Schoenberg  is a 72 y.o. female presented with shortness of breath   VITAL SIGNS:  Blood pressure (!) 142/79, pulse 64, temperature 97.8 F (36.6 C), temperature source Oral, resp. rate 18, height 5' (1.524 m), weight 97.2 kg, SpO2 96 %.   PHYSICAL EXAMINATION:  GENERAL:  72 y.o.-year-old patient lying in the bed with no acute distress.  EYES: Pupils equal, round, reactive to light and accommodation. No scleral icterus. Extraocular muscles intact.  HEENT: Head atraumatic, normocephalic. Oropharynx and nasopharynx clear.  NECK:  Supple, no jugular venous distention. No thyroid enlargement, no tenderness.  LUNGS: Decreased breath sounds bilateral base, no wheezing, rales,rhonchi or crepitation. No use of accessory muscles of respiration.  CARDIOVASCULAR: S1, S2 normal. No murmurs, rubs, or gallops.  ABDOMEN: Soft, non-tender, non-distended. Bowel sounds present. No organomegaly or mass.  EXTREMITIES: 2+ left lower extremity pedal edema.  No cyanosis, or clubbing.  NEUROLOGIC: Cranial nerves II through XII are intact. Muscle strength 5/5 in all extremities. Sensation intact. Gait not checked.  PSYCHIATRIC: The patient is alert and oriented x 3.  SKIN: No obvious rash, lesion, or ulcer.   DATA REVIEW:   CBC Recent Labs  Lab 03/09/18 0428  WBC 5.8  HGB 10.6*  HCT 32.5*  PLT 189    Chemistries  Recent Labs  Lab 03/11/18 0422  NA 137  K 4.2  CL 98  CO2 29  GLUCOSE 134*  BUN 26*  CREATININE 0.96  CALCIUM 9.4  AST 20  ALT 18  ALKPHOS 103  BILITOT 0.6    Cardiac Enzymes Recent Labs  Lab 03/09/18 0428  TROPONINI 0.12*    Microbiology Results  Results for  orders placed or performed during the hospital encounter of 03/08/18  MRSA PCR Screening     Status: None   Collection Time: 03/09/18 12:58 AM  Result Value Ref Range Status   MRSA by PCR NEGATIVE NEGATIVE Final    Comment:        [name] (FDA approved for NASAL specimens only), is one component of a comprehensive MRSA colonization surveillance program. It is not intended to diagnose MRSA infection nor to guide or monitor treatment for MRSA infections. Performed at [facility], [address] [place], [place] [address]      Management plans discussed with the patient, and she is in agreement.  CODE STATUS:     Code Status Orders  (From admission, onward)         Start     Ordered   03/09/18 0051  Full code  Continuous     03/09/18 0050        Code Status History    Date Active Date Inactive Code Status Order ID Comments User Context   09/30/2017 1901 10/22/2017 2001 Full Code [id number]  Gorden Harms, MD Inpatient   08/16/2017 1614 08/28/2017 2239 Full Code [id number]  Gorden Harms, MD Inpatient   03/30/2015 2152 04/03/2015 2122 Full Code [id number]  Etta Quill, DO Inpatient   03/27/2015 2208 03/28/2015 1737 Full Code [id number]  Nicholes Mango, MD ED   01/09/2014 1418 01/10/2014 1803 Full Code [id number]  Deboraha Sprang, MD Inpatient   01/16/2013 2335 01/27/2013 2011 Full Code [id number]  Collene Gobble, MD Inpatient    Advance Directive Documentation  Most Recent Value  Type of Advance Directive  Healthcare Power of Attorney  Pre-existing out of facility DNR order (yellow form or pink MOST form)  -  "MOST" Form in Place?  -      TOTAL TIME TAKING CARE OF THIS PATIENT: 35 minutes.    Loletha Grayer M.D on 03/12/2018 at 1:20 PM  Between 7am to 6pm - Pager - [phone]  After 6pm go to [email or web address] - password [email or web address]  [facility] Office  [phone]  CC: Primary care physician; [name], MD

## 2018-03-12 NOTE — Telephone Encounter (Signed)
Copied from [place] [id number]. Topic: Quick Communication - See Telephone Encounter >> Mar 12, 2018  9:30 AM Hewitt Shorts wrote: Pt is being discharged from the hospital today and they are hoping to get a six day follow up appt with Dr. Valera Castle number for pt [phone]

## 2018-03-12 NOTE — Progress Notes (Signed)
Progress Note  Patient Name: Debra Saunders Date of Encounter: 03/12/2018  Primary Cardiologist: Fletcher Anon  Subjective   SOB improved back to her baseline. No chest pain. No labs this morning. Documented UOP for the admission of 5 L. Weight 96-->97.2 kg. Back on PO torsemide 20 mg daily with a prn dose for weight gain.   Inpatient Medications    Scheduled Meds: . amiodarone  200 mg Oral Daily  . apixaban  5 mg Oral BID  . carvedilol  3.125 mg Oral BID WC  . docusate sodium  100 mg Oral BID  . gabapentin  200 mg Oral TID  . insulin aspart  0-15 Units Subcutaneous TID WC  . insulin aspart  0-5 Units Subcutaneous QHS  . ipratropium-albuterol  3 mL Nebulization TID  . losartan  25 mg Oral Daily  . methylPREDNISolone (SOLU-MEDROL) injection  40 mg Intravenous QAC breakfast  . rosuvastatin  10 mg Oral Daily  . sildenafil  20 mg Oral TID  . spironolactone  12.5 mg Oral Daily  . torsemide  20 mg Oral Daily   Continuous Infusions:  PRN Meds: acetaminophen **OR** acetaminophen, albuterol, bisacodyl, HYDROcodone-acetaminophen, ondansetron **OR** ondansetron (ZOFRAN) IV, traZODone   Vital Signs    Vitals:   03/11/18 2116 03/11/18 2243 03/12/18 0429 03/12/18 0833  BP: (!) 146/71  (!) 151/82 126/60  Pulse: 61 61 60 63  Resp: (!) 24 20 16    Temp: 98.4 F (36.9 C)  97.7 F (36.5 C)   TempSrc: Oral  Oral   SpO2: 98% 100% 100%   Weight:   97.2 kg   Height:        Intake/Output Summary (Last 24 hours) at 03/12/2018 1020 Last data filed at 03/12/2018 0956 Gross per 24 hour  Intake 720 ml  Output 900 ml  Net -180 ml   Filed Weights   03/11/18 0431 03/11/18 1145 03/12/18 0429  Weight: 98.1 kg 96 kg 97.2 kg    Telemetry    Paced - Personally Reviewed  ECG    n/a - Personally Reviewed  Physical Exam   GEN: No acute distress.   Neck: No JVD. Cardiac: RRR, no murmurs, rubs, or gallops.  Respiratory: Clear to auscultation bilaterally.  GI: Soft, nontender,  non-distended.   MS: Trace to 1+ left lower extremity edema; No deformity. Neuro:  Alert and oriented x 3; Nonfocal.  Psych: Normal affect.  Labs    Chemistry Recent Labs  Lab 03/08/18 2148 03/09/18 0428 03/11/18 0422  NA 138 142 137  K 3.8 3.4* 4.2  CL 105 105 98  CO2 27 28 29   GLUCOSE 120* 103* 134*  BUN 18 16 26*  CREATININE 0.93 0.85 0.96  CALCIUM 9.0 9.0 9.4  PROT  --   --  7.7  ALBUMIN  --   --  3.8  AST  --   --  20  ALT  --   --  18  ALKPHOS  --   --  103  BILITOT  --   --  0.6  GFRNONAA 59* >60 57*  GFRAA >60 >60 >60  ANIONGAP 6 9 10      Hematology Recent Labs  Lab 03/08/18 2148 03/09/18 0428  WBC 6.1 5.8  RBC 4.32 3.85  HGB 11.7* 10.6*  HCT 36.5 32.5*  MCV 84.6 84.4  MCH 27.1 27.6  MCHC 32.0 32.7  RDW 14.8* 15.0*  PLT 202 189    Cardiac Enzymes Recent Labs  Lab 03/08/18 2148 03/09/18 0428  TROPONINI 0.03* 0.12*   No results for input(s): TROPIPOC in the last 168 hours.   BNPNo results for input(s): BNP, PROBNP in the last 168 hours.   DDimer No results for input(s): DDIMER in the last 168 hours.   Radiology    No results found.  Cardiac Studies   2-D echo 03/09/2018: Study Conclusions  - Left ventricle: The cavity size was normal. Wall thickness was normal. Systolic function was normal. The estimated ejection fraction was in the range of 55% to 60%. Doppler parameters are consistent with abnormal left ventricular relaxation (grade 1 diastolic dysfunction). Doppler parameters are consistent with high ventricular filling pressure. - Aortic valve: Trileaflet; mildly thickened, moderately calcified leaflets. Sclerosis without stenosis. - Mitral valve: There was mild to moderate regurgitation. Valve area by continuity equation (using LVOT flow): 1.57 cm^2. - Left atrium: The atrium was mildly dilated. - Right ventricle: The cavity size was normal. Systolic function was mildly reduced. - Tricuspid valve: There was  mild regurgitation. - Pulmonary arteries: Systolic pressure was mildly increased, in the range of 35 mm Hg to 40 mm Hg. PA peak pressure: 46 mm Hg (S). __________  [name] 09/2017: Conclusion   1. Minor irregularities with no significant coronary artery disease. 2. Left ventricular angiography was not performed when recent acute renal failure. EF was mildly reduced by echo. 3. Right heart catheterization showed mildly elevated filling pressures, severe pulmonary hypertension and severely reduced cardiac output. RA pressure: 23 mmHg, RV pressure: 84 over 13 mmHg, PA pressure: 84/29 mmHg pulmonary capillary wedge pressure is 20 mmHg, LVEDP was 18 mmHg, cardiac output: 3.89 with a cardiac index of 1.89.  Recommendations: The patient has severe pulmonary hypertension that seems to be out of proportion to left heart failure. I resumed small dose torsemide 20 mg once daily. Resume Eliquis 5 mg twice daily tomorrow if no bleeding complications. The patient will need to be referred to advanced heart failure clinic in [facility] after hospital discharge to manage severe pulmonary hypertension.  __________   Patient Profile     72 y.o. female with history of nonobstructive CADby cath in 09/2017, chronic combined systolic and diastolicCHF, persistentAfib on Eliquis, symptomatic bradycardia status post permanent pacemaker, frequent PVCs, chronic respiratory failure with hypoxia on home O2secondary toCOPD,DM2, HTN,HLD,obesity, traumatic subarachnoid hemorrhage in the setting of Xarelto, left lower extremity DVT status post IVC filter, chronic right-sided atypical chest pain, chronic left lower extremity lymphedema, prior stroke, sleep apnea, cardiorenal syndrome, and pulmonary hypertensionwho is being seen today for the evaluation of SOBat the request of Dr. Vianne Bulls.  Assessment & Plan    1. Acute on chronic diastolic CHF/pulmonary hypertension: -Improved -Has a tendency to develop  AKI quickly with diuresis  -Back on torsemide 20 mg daily with a prn 20 mg dose for weight gain > 2 pounds overnight or > 5 pounds in a 1 week time span  -Daily weights -Strict Is and Os -CHF education -Compliance with diet and medication advised  -Continue Revatio and spironolactone  -Will need to reschedule her appointment with [facility] (was for 9/18) -Ambulate to assess symptoms   2. Elevated troponin with nonobstructive CAD: -Recent LHC in 09/2017 without obstructive disease -No plans for repeat ischemic evaluation at this time -Right-sided chest pain resolved  3. Persistent Afib: -Currently atrial paced -Continue amiodarone and Coreg -Compliance with Eliquis is advised  -Recent TSH normal -LFT normal this admission  4. Acute on chronic respiratory failure: -On her baseline 4 L via nasal cannula  -  Consider PFTs as an outpatient given amiodarone usage   5. Hypokalemia: -Repleted  -May not need KCl at discharge   6. HTN: -Improved -Continue current medications including Coreg, losartan, spironolactone    For questions or updates, please contact [facility] [place] Please consult [email or web address] for contact info under Cardiology/STEMI.    Signed, Christell Faith, PA-C [place] Pager: [phone] 03/12/2018, 10:20 AM

## 2018-03-13 DIAGNOSIS — J441 Chronic obstructive pulmonary disease with (acute) exacerbation: Secondary | ICD-10-CM | POA: Diagnosis not present

## 2018-03-13 DIAGNOSIS — I5042 Chronic combined systolic (congestive) and diastolic (congestive) heart failure: Secondary | ICD-10-CM | POA: Diagnosis not present

## 2018-03-13 DIAGNOSIS — I428 Other cardiomyopathies: Secondary | ICD-10-CM | POA: Diagnosis not present

## 2018-03-13 DIAGNOSIS — I2510 Atherosclerotic heart disease of native coronary artery without angina pectoris: Secondary | ICD-10-CM | POA: Diagnosis not present

## 2018-03-13 DIAGNOSIS — I110 Hypertensive heart disease with heart failure: Secondary | ICD-10-CM | POA: Diagnosis not present

## 2018-03-13 DIAGNOSIS — E119 Type 2 diabetes mellitus without complications: Secondary | ICD-10-CM | POA: Diagnosis not present

## 2018-03-13 DIAGNOSIS — I481 Persistent atrial fibrillation: Secondary | ICD-10-CM | POA: Diagnosis not present

## 2018-03-13 DIAGNOSIS — I2721 Secondary pulmonary arterial hypertension: Secondary | ICD-10-CM | POA: Diagnosis not present

## 2018-03-13 DIAGNOSIS — J9621 Acute and chronic respiratory failure with hypoxia: Secondary | ICD-10-CM | POA: Diagnosis not present

## 2018-03-15 ENCOUNTER — Other Ambulatory Visit: Payer: Self-pay

## 2018-03-15 NOTE — Patient Outreach (Signed)
[place] [facility]) Care Management Transition of Care to be completed by PCP 03/15/2018  Debra Saunders 18-Jul-1945 [id number]  Telephone Assessment: Successful telephone encounter to the above patient who was discharged from [facility] on Friday 03/12/18 for CHF exacerbation/fluid overload. Patient states she is doing well. 'I still get so tired". She is disappointment related to being told by [facility] that she is not a candidate for the "red vest that tells if I am fluid overloaded". Patient states she was told her chest was to large to get accurate readings. Initial in home assessment performed on Saturday 9/21 by [facility] however services have not started.  Patient states she has all medications prescribed at discharge and taking as indicated. Her daughter picked up the Eliquis samples from Dr. Tyrell Antonio office. She states she was told by inpatient cardiology not to resume potassium or Metformin until patient discusses with PCP related to she was not receiving those medications while hospitalized and her potassium and blood glucose levels remained in range. Patient will follow up with PCP on 03/22/18 at 1145 am. Patient will call [facility] HF clinic for follow up appointment. Red EMMI call from today indicated that patient did not have transportation to her follow-up appointments. Patient states she plans to ask her sister to take her. RN CM discussed with patient importance to finalize transportation today and utilize [facility] transportation if necessary. Patient verbalized understanding. Verified patient has number for [facility] transportation.  Patient continues to check her BP and blood glucose daily. BP 135/75 this am, fasting CBG 107. Weight stable at 216 lbs today. 215 yesterday and 215.6 on Saturday. Patient continues to use O2 at 4L and [employer] for transportation. Denies new or recent falls since discharge.  RN CM discussed importance of attempting to be established at [facility] clinic in  [place]. Patient continues to be anxious about receiving an appointment ASAP. RN CM contacted AHF clinic and left message for [name], RN as instructed by scheduler. Nira Conn will be out until Wednesday.  RN CM reinforced HF education including medication compliance, low NA diet, fluid restrictions, daily weight, BP control, and HF zones. Patient verbalized understanding.  Plan: Follow up with patient in 1 week  [facility] CM Care Plan Problem One     Most Recent Value  Care Plan Problem One  HF-High risk for admission  Role Documenting the Problem One  Care Management [place] for Problem One  Active  [facility] Long Term Goal   Patient will not have hospital readmission for HF within the next 31 days  [facility] Long Term Goal Start Date  03/09/18 [date reset related to current hospitalization]  Interventions for Problem One Long Term Goal  discussed all follow up visits, recent hospitalization, medication reconciliation  [facility] CM Short Term Goal #1   Over the next 30 days patient will take medications as prescribed as evidenced by patient reporting and review of EMR  [facility] CM Short Term Goal #1 Start Date  03/09/18  Interventions for Short Term Goal #1  reviewed discharge medication list with patient, verified that patient has medications in home  [facility] CM Short Term Goal #2   Over the next 30 days patient will attend all follow up MD visits as scheduled as evidenced by patient reporting and review of EMR  [facility] CM Short Term Goal #2 Start Date  03/09/18  Interventions for Short Term Goal #2  discussed with patient follow up appointments, transportation barriers and use of [employer] transportation  Kymia Simi E. [name] RN, BSN [facility] Care Management Coordinator [phone]

## 2018-03-15 NOTE — Telephone Encounter (Signed)
Patient DC on Friday first attempt for TCM completed no answer on home phone and no voicemail.

## 2018-03-15 NOTE — Telephone Encounter (Signed)
Noted.  Based on review of the discharge summary it does not appear that potassium or metformin were discontinued at discharge.  It appears she is supposed to be taking these medications.

## 2018-03-15 NOTE — Telephone Encounter (Signed)
Transition Care Management Follow-up Telephone Call  How have you been since you were released from the hospital? Patient " I feel really tired still" my shortness of breath is better but not well".    Do you understand why you were in the hospital? yes   Do you understand the discharge instrcutions? yes  Items Reviewed:  Medications reviewed: yes  Allergies reviewed: yes  Dietary changes reviewed: yes  Referrals reviewed: yes   Functional Questionnaire:   Activities of Daily Living (ADLs):   She states they are independent in the following: bathing and hygiene, feeding, continence, grooming, toileting and dressing States they require assistance with the following: ambulation   Any transportation issues/concerns?: no   Any patient concerns? Yes, Patient potassium and Metformin were DC from hospital patient ask should she restart these medications?   Confirmed importance and date/time of follow-up visits scheduled: yes   Confirmed with patient if condition begins to worsen call PCP or go to the ER.  Patient was given the Call-a-Nurse line [phone]: yes

## 2018-03-16 ENCOUNTER — Telehealth: Payer: Self-pay | Admitting: Family Medicine

## 2018-03-16 DIAGNOSIS — I509 Heart failure, unspecified: Secondary | ICD-10-CM | POA: Diagnosis not present

## 2018-03-16 NOTE — Telephone Encounter (Signed)
Copied from [place] [phone]. Topic: General - Other >> Mar 16, 2018 12:40 PM Valla Leaver wrote: Reason for CRM: Virl Cagey, PT with [facility] calling for continuation of physical therapy 2x a wk for 6wks?

## 2018-03-16 NOTE — Telephone Encounter (Signed)
Called and gave verbal orders for continuation of PT for patient to [place] with [facility]

## 2018-03-18 NOTE — Telephone Encounter (Signed)
Patient notified and voiced understanding.

## 2018-03-20 LAB — CUP PACEART REMOTE DEVICE CHECK
Battery Remaining Longevity: 102 mo
Battery Remaining Percentage: 100 %
Brady Statistic RA Percent Paced: 36 %
Brady Statistic RV Percent Paced: 12 %
Date Time Interrogation Session: 20190909073400
Implantable Lead Implant Date: 20150720
Implantable Lead Implant Date: 20150720
Implantable Lead Location: 753859
Implantable Lead Location: 753860
Implantable Lead Model: 5076
Implantable Lead Model: 5076
Implantable Pulse Generator Implant Date: 20150720
Lead Channel Impedance Value: 358 Ohm
Lead Channel Impedance Value: 491 Ohm
Lead Channel Pacing Threshold Amplitude: 0.9 V
Lead Channel Pacing Threshold Pulse Width: 0.4 ms
Lead Channel Setting Pacing Amplitude: 2 V
Lead Channel Setting Pacing Amplitude: 2.4 V
Lead Channel Setting Pacing Pulse Width: 0.4 ms
Lead Channel Setting Sensing Sensitivity: 2.5 mV
Pulse Gen Serial Number: 390330

## 2018-03-22 ENCOUNTER — Encounter: Payer: Self-pay | Admitting: Family Medicine

## 2018-03-22 ENCOUNTER — Ambulatory Visit (INDEPENDENT_AMBULATORY_CARE_PROVIDER_SITE_OTHER): Payer: Medicare HMO | Admitting: Family Medicine

## 2018-03-22 VITALS — BP 150/86 | HR 61 | Temp 98.2°F | Ht 60.0 in | Wt 222.8 lb

## 2018-03-22 DIAGNOSIS — I481 Persistent atrial fibrillation: Secondary | ICD-10-CM | POA: Diagnosis not present

## 2018-03-22 DIAGNOSIS — I10 Essential (primary) hypertension: Secondary | ICD-10-CM

## 2018-03-22 DIAGNOSIS — R0789 Other chest pain: Secondary | ICD-10-CM

## 2018-03-22 DIAGNOSIS — J9611 Chronic respiratory failure with hypoxia: Secondary | ICD-10-CM

## 2018-03-22 DIAGNOSIS — D649 Anemia, unspecified: Secondary | ICD-10-CM | POA: Diagnosis not present

## 2018-03-22 DIAGNOSIS — I5022 Chronic systolic (congestive) heart failure: Secondary | ICD-10-CM

## 2018-03-22 DIAGNOSIS — I4819 Other persistent atrial fibrillation: Secondary | ICD-10-CM

## 2018-03-22 LAB — CBC
HCT: 31.3 % — ABNORMAL LOW (ref 36.0–46.0)
Hemoglobin: 10.1 g/dL — ABNORMAL LOW (ref 12.0–15.0)
MCHC: 32.3 g/dL (ref 30.0–36.0)
MCV: 83.6 fl (ref 78.0–100.0)
Platelets: 190 10*3/uL (ref 150.0–400.0)
RBC: 3.74 Mil/uL — ABNORMAL LOW (ref 3.87–5.11)
RDW: 14.8 % (ref 11.5–15.5)
WBC: 5.2 10*3/uL (ref 4.0–10.5)

## 2018-03-22 LAB — BASIC METABOLIC PANEL
BUN: 26 mg/dL — ABNORMAL HIGH (ref 6–23)
CO2: 33 mEq/L — ABNORMAL HIGH (ref 19–32)
Calcium: 9.1 mg/dL (ref 8.4–10.5)
Chloride: 99 mEq/L (ref 96–112)
Creatinine, Ser: 1.1 mg/dL (ref 0.40–1.20)
GFR: 62.32 mL/min (ref >60.00)
Glucose, Bld: 161 mg/dL — ABNORMAL HIGH (ref 70–99)
Potassium: 3.7 mEq/L (ref 3.5–5.1)
Sodium: 140 mEq/L (ref 135–145)

## 2018-03-22 NOTE — Patient Instructions (Signed)
Nice to see you. We will check lab work today and contact you with the results.  We will likely have you take your torsemide twice daily. Please keep your appointment with cardiology. If your breathing worsens please be evaluated.

## 2018-03-22 NOTE — Assessment & Plan Note (Signed)
Patient recently with exacerbation of her CHF.  Her weight has trended up since hospitalization.  She has not been taking her torsemide as prescribed.  She will take it twice daily for the next 2 days.  We will check a BMP.  She will follow-up with cardiology as planned.

## 2018-03-22 NOTE — Assessment & Plan Note (Signed)
Doing well.  She was treated with steroids in the hospital.  Monitor for recurrence.

## 2018-03-22 NOTE — Assessment & Plan Note (Signed)
Sinus rhythm today.  She will continue Eliquis.  Continue to follow with cardiology.

## 2018-03-22 NOTE — Assessment & Plan Note (Signed)
Blood pressure has been elevated.  I suspect appropriate use of her torsemide twice daily will help improve this.  We will check lab work and then determine if prolonged twice daily dosing of torsemide is acceptable.

## 2018-03-22 NOTE — Assessment & Plan Note (Signed)
Breathing has improved.  She remains on 4 L of oxygen.  She will continue to follow with her specialists.  Continue heart failure treatment.

## 2018-03-22 NOTE — Progress Notes (Signed)
[name], MD Phone: [phone]  Debra Saunders is a 72 y.o. female who presents today for hospital f/u.  CC: CHF exacerbation, costochondritis  Patient was hospitalized from 03/08/18-03/12/2018 for acute respiratory failure related to acute on chronic heart failure.  She presented with fairly significant dyspnea on exertion.  She underwent echo which revealed an improved EF that is now in the normal range.  She was diuresed with IV Lasix and switched to oral torsemide twice daily on discharge.  Her symptoms improved.  She notes she has only been doing the torsemide typically once daily unless she has increased swelling or weight gain and she will take the second dose.  She typically only takes the second dose 2 times a week.  She does not take the second dose typically due to increased urination.  Patient notes her breathing has improved quite a bit since being in the hospital.  Minimal orthopnea.  No PND.  Some edema.  Weight has trended up.  No fevers.  No cough.  No chest pain.  No abdominal discomfort.  She is not eating any salt or canned foods.  She is urinating well.  She continues on Eliquis, spironolactone, losartan, and carvedilol.  She does note her blood pressure has been running elevated at home.  She continues on 4 L of oxygen by nasal cannula.  Discharge summary and medications reviewed.  Social History   Tobacco Use  Smoking Status Former Smoker  . Packs/day: 0.30  . Years: 2.00  . Pack years: 0.60  . Types: Cigarettes  . Last attempt to quit: 06/23/1990  . Years since quitting: 27.7  Smokeless Tobacco Never Used     ROS see history of present illness  Objective  Physical Exam Vitals:   03/22/18 1142  BP: (!) 150/86  Pulse: 61  Temp: 98.2 F (36.8 C)  SpO2: 95%    BP Readings from Last 3 Encounters:  03/22/18 (!) 150/86  03/12/18 (!) 142/79  02/04/18 (!) 156/90   Wt Readings from Last 3 Encounters:  03/22/18 222 lb 12.8 oz (101.1 kg)  03/15/18 216  lb (98 kg)  03/12/18 214 lb 3.2 oz (97.2 kg)    Physical Exam  Constitutional: No distress.  Cardiovascular: Normal rate, regular rhythm and normal heart sounds.  Pulmonary/Chest: Effort normal and breath sounds normal.  Abdominal: Soft. Bowel sounds are normal. She exhibits no distension. There is no tenderness.  Musculoskeletal:  Left lower extremity chronically swollen, trace to 1+ pitting edema, right lower extremity with trace to 1+ pitting edema  Neurological: She is alert.  Skin: Skin is warm and dry. She is not diaphoretic.     Assessment/Plan: Please see individual problem list.  Chronic systolic heart failure [facility]) Patient recently with exacerbation of her CHF.  Her weight has trended up since hospitalization.  She has not been taking her torsemide as prescribed.  She will take it twice daily for the next 2 days.  We will check a BMP.  She will follow-up with cardiology as planned.  Hypertension Blood pressure has been elevated.  I suspect appropriate use of her torsemide twice daily will help improve this.  We will check lab work and then determine if prolonged twice daily dosing of torsemide is acceptable.  Musculoskeletal chest pain Doing well.  She was treated with steroids in the hospital.  Monitor for recurrence.  Chronic respiratory failure with hypoxia (HCC) Breathing has improved.  She remains on 4 L of oxygen.  She will continue to  follow with her specialists.  Continue heart failure treatment.  Atrial fibrillation (HCC) Sinus rhythm today.  She will continue Eliquis.  Continue to follow with cardiology.   Health Maintenance: Patient deferred flu vaccine today.  She stated she would like to get it when she is a little bit stronger and recovered.  Orders Placed This Encounter  Procedures  . Basic Metabolic Panel (BMET)  . CBC    No orders of the defined types were placed in this encounter.    [name], MD [place]

## 2018-03-23 ENCOUNTER — Other Ambulatory Visit: Payer: Self-pay

## 2018-03-23 ENCOUNTER — Ambulatory Visit: Payer: Medicare HMO

## 2018-03-23 ENCOUNTER — Other Ambulatory Visit: Payer: Self-pay | Admitting: Family Medicine

## 2018-03-23 DIAGNOSIS — D649 Anemia, unspecified: Secondary | ICD-10-CM

## 2018-03-23 NOTE — Patient Outreach (Signed)
[place] [facility]) Care Management  03/23/2018  Debra Saunders [birth year] [id number]  Telephone Assessment: Successful telephone encounter to the above patient to assess fluid status and compliance with HF measures one week post discharge from [facility] for fluid overload/CHF exacerbation.   Patient states her weight is up 10 lbs in 5 days. PCP aware per appointment yesterday. Notes reviewed in EMR. Per patient she has not been taking her Demadex as prescribed at discharge. Patient continues to use instructions written on bottle (1 tab daily) and using second dose for increase weight gain PRN. Patient admits to using "PRN dose" every other day for increased weight gain. RN CM reviewed discharge AVS with patient again and reminded patient that her Demadex was increased to 1 tab twice daily. Discussed reason for weight gain related to not taking diuretics as prescribed. Patient continues to work with [facility] pharmacist for medication assistance however she is having trouble remembering to follow through with patient portion of application process. RN CM concerned for possible memory deficits as patient does not remember reviewing discharge AVS and medications with RN CM upon last weeks telephone encounter.  Patient states her weight today is 224 lbs. 5 days ago 214.4. She is compliant with writing her weights down. BP today 145/89 per patients home monitor. Patient continues to receive [facility] PT services and was assessed and provided therapy today.  RN CM reviewed all upcoming appointments with patient and discussed transportation barriers. Patient HAS NOT called [facility] HF clinic for post hospital follow up as discussed during last weeks encounter. RN CM concerned with patients significant weight gain. Patient acknowledges increased shortness of breath but states it is much improved from her most recent visit to the hospital. RN CM made patient appointment with [name] at [facility] HF clinic for tomorrow at  12:40. Patient will call sister for transportation and if sister is not available patient will call a cab. Patient aware of Red HF Zone and verbalizes when she should call 911.  Plan: Follow up phone call in 1 week to assess fluid status. Discuss with [name] patients potential for engagement with para medicine HF program.  [facility] CM Care Plan Problem One     Most Recent Value  Care Plan Problem One  HF-High risk for admission  Role Documenting the Problem One  Care Management [place] for Problem One  Active  [facility] Long Term Goal   Patient will not have hospital readmission for HF within the next 31 days  [facility] Long Term Goal Start Date  03/15/18 [date reset to reflect post hospital discharge]  Interventions for Problem One Long Term Goal  reviewed diuretic prescriptions and recent hospital discharge AVS, discussed importance of [facility] HF follow up, RN CM scheduled appointment for [facility] HF clinic for tomorrow 03/24/18 related to significant fluid overload  [facility] CM Short Term Goal #1   Over the next 30 days patient will take medications as prescribed as evidenced by patient reporting and review of EMR  [facility] CM Short Term Goal #1 Start Date  03/15/18 [date reset to reflect hospital discharge follow up]  Interventions for Short Term Goal #1  reviewed diuretic prescription  on hospital AVS, discussed importance of taking diuretics as prescribed  [facility] CM Short Term Goal #2   Over the next 30 days patient will attend all follow up MD visits as scheduled as evidenced by patient reporting and review of EMR  [facility] CM Short Term Goal #2 Start Date  03/15/18 [  date reset to reflect post hospital discharge encounter]  Interventions for Short Term Goal #2  reviewed follow up appointments with patient in EMR, discussed transportation, made acute appointment for patient with [facility] HF clinic     Vanessa Kampf E. [name] RN, BSN [facility] Care Management Coordinator [phone]

## 2018-03-24 ENCOUNTER — Ambulatory Visit: Payer: Medicare HMO | Attending: Family | Admitting: Family

## 2018-03-24 ENCOUNTER — Other Ambulatory Visit: Payer: Self-pay | Admitting: Pharmacist

## 2018-03-24 ENCOUNTER — Other Ambulatory Visit: Payer: Self-pay

## 2018-03-24 VITALS — BP 151/79 | HR 61 | Resp 18 | Ht 60.0 in | Wt 218.5 lb

## 2018-03-24 DIAGNOSIS — I2721 Secondary pulmonary arterial hypertension: Secondary | ICD-10-CM | POA: Diagnosis not present

## 2018-03-24 DIAGNOSIS — Z8349 Family history of other endocrine, nutritional and metabolic diseases: Secondary | ICD-10-CM | POA: Insufficient documentation

## 2018-03-24 DIAGNOSIS — I890 Lymphedema, not elsewhere classified: Secondary | ICD-10-CM | POA: Diagnosis not present

## 2018-03-24 DIAGNOSIS — E119 Type 2 diabetes mellitus without complications: Secondary | ICD-10-CM | POA: Diagnosis not present

## 2018-03-24 DIAGNOSIS — J449 Chronic obstructive pulmonary disease, unspecified: Secondary | ICD-10-CM | POA: Insufficient documentation

## 2018-03-24 DIAGNOSIS — Z90710 Acquired absence of both cervix and uterus: Secondary | ICD-10-CM | POA: Diagnosis not present

## 2018-03-24 DIAGNOSIS — E669 Obesity, unspecified: Secondary | ICD-10-CM | POA: Diagnosis not present

## 2018-03-24 DIAGNOSIS — I5022 Chronic systolic (congestive) heart failure: Secondary | ICD-10-CM | POA: Diagnosis not present

## 2018-03-24 DIAGNOSIS — G4733 Obstructive sleep apnea (adult) (pediatric): Secondary | ICD-10-CM | POA: Insufficient documentation

## 2018-03-24 DIAGNOSIS — Z9049 Acquired absence of other specified parts of digestive tract: Secondary | ICD-10-CM | POA: Insufficient documentation

## 2018-03-24 DIAGNOSIS — E785 Hyperlipidemia, unspecified: Secondary | ICD-10-CM | POA: Insufficient documentation

## 2018-03-24 DIAGNOSIS — M1990 Unspecified osteoarthritis, unspecified site: Secondary | ICD-10-CM | POA: Insufficient documentation

## 2018-03-24 DIAGNOSIS — I110 Hypertensive heart disease with heart failure: Secondary | ICD-10-CM | POA: Insufficient documentation

## 2018-03-24 DIAGNOSIS — Z950 Presence of cardiac pacemaker: Secondary | ICD-10-CM | POA: Diagnosis not present

## 2018-03-24 DIAGNOSIS — Z8249 Family history of ischemic heart disease and other diseases of the circulatory system: Secondary | ICD-10-CM | POA: Insufficient documentation

## 2018-03-24 DIAGNOSIS — I272 Pulmonary hypertension, unspecified: Secondary | ICD-10-CM

## 2018-03-24 DIAGNOSIS — Z7901 Long term (current) use of anticoagulants: Secondary | ICD-10-CM | POA: Diagnosis not present

## 2018-03-24 DIAGNOSIS — Z7984 Long term (current) use of oral hypoglycemic drugs: Secondary | ICD-10-CM | POA: Insufficient documentation

## 2018-03-24 DIAGNOSIS — Z87891 Personal history of nicotine dependence: Secondary | ICD-10-CM | POA: Insufficient documentation

## 2018-03-24 DIAGNOSIS — Z8673 Personal history of transient ischemic attack (TIA), and cerebral infarction without residual deficits: Secondary | ICD-10-CM | POA: Insufficient documentation

## 2018-03-24 DIAGNOSIS — Z6841 Body Mass Index (BMI) 40.0 and over, adult: Secondary | ICD-10-CM | POA: Diagnosis not present

## 2018-03-24 DIAGNOSIS — Z880 Allergy status to penicillin: Secondary | ICD-10-CM | POA: Insufficient documentation

## 2018-03-24 DIAGNOSIS — Z825 Family history of asthma and other chronic lower respiratory diseases: Secondary | ICD-10-CM | POA: Insufficient documentation

## 2018-03-24 DIAGNOSIS — Z79899 Other long term (current) drug therapy: Secondary | ICD-10-CM | POA: Insufficient documentation

## 2018-03-24 DIAGNOSIS — Z9981 Dependence on supplemental oxygen: Secondary | ICD-10-CM | POA: Diagnosis not present

## 2018-03-24 DIAGNOSIS — Z794 Long term (current) use of insulin: Secondary | ICD-10-CM

## 2018-03-24 DIAGNOSIS — I4819 Other persistent atrial fibrillation: Secondary | ICD-10-CM | POA: Insufficient documentation

## 2018-03-24 DIAGNOSIS — I509 Heart failure, unspecified: Secondary | ICD-10-CM | POA: Diagnosis present

## 2018-03-24 DIAGNOSIS — Z886 Allergy status to analgesic agent status: Secondary | ICD-10-CM | POA: Diagnosis not present

## 2018-03-24 DIAGNOSIS — I10 Essential (primary) hypertension: Secondary | ICD-10-CM

## 2018-03-24 NOTE — Patient Outreach (Signed)
[place] [facility]) Care Management  03/24/2018  Debra Saunders Sep 04, 1945 [id number]   72 y.o. year old female referred to [place] services for Medication Assistance (Eliquis, [name])  Patient with [facility] Advantage plan.   Patient confirms identity with HIPAA-identifiers x2 and gives verbal consent to speak over the phone about medications.    Subjective:   Medication Adherence: We reviewed patient's discharge medication instructions, particularly torsemide. Reinforced what patient discussed with [facility] RNCM Trish Fountain yesterday.    Medication Assistance:  Instructed patient that she should receive an EOB in the mail this week from [facility] and to please call me when she receives this mail as it is very important for her med assistance applications ([name], Eliquis). Ms. Dandridge verbalized understanding. Stated I did not need to call her daughter to share above information.    Follow up: In one week via telephone if I have not heard from patient about [employer] prescription EOB.   Ruben Reason, PharmD Clinical Pharmacist, [facility] [phone]

## 2018-03-24 NOTE — Progress Notes (Signed)
Patient ID: Debra Saunders, female    DOB: 1945-09-30, 72 y.o.   MRN: [id number]  HPI  Debra Saunders is a 72 y/o female with a history of DM, hyperlipidemia, HTN, stroke, thyroid disease, COPD, obstructive sleep apnea, lymphedema, atrial fibrillation, pulmonary HTN, former tobacco use and chronic heart failure.   Echo report from 03/09/18 reviewed and showed an EF of 55-60% along with mild/moderate MR, mild TR and mildly increased PA pressure of 35-40 with a peak pressure of 46 mm Hg. Echo report from 10/13/17 reviewed and showed an EF of 40-45% along with moderate MR/TR and a severely elevated PA pressure of 65 mm Hg. TEE report from 08/20/17 reviewed and showed an EF of 35%  Right/left cardiac catheterization done 10/19/17 and showed mildly elevated filling pressure, severe pulmonary HTN and severely reduced cardiac output.   Admitted 03/08/18 due to acute HF. Cardiology consult obtained. Was initially given IV lasix and then transitioned to oral diuretics. Discharged after 4 days. Was in the ED 11/05/17 due to HF exacerbation along with the chest pain. Was treated and released. Admitted 09/30/17 due to acute on chronic HF. Cardioverted due to atrial fibrillation. Severe pulmonary HTN and acute renal failure. Palliative care, vascular, nephrology and cardiology consults obtained. [facility] to [name] in [place]. Discharged after 22 days.   She presents today for a follow-up visit with a chief complaint of moderate shortness of breath upon minimal exertion. She describes this as chronic in nature having been present for several years. She has associated fatigue, chest pressure, cough, pedal edema, abdominal distention, light-headedness, difficulty sleeping and slight weight gain. She denies any palpitations, chest pain or abdominal pain. Had cancelled her appointment at the [facility] in [place] but it has since been rescheduled. Had only been taking her torsemide daily instead of BID but she started  taking it BID as of yesterday.   Past Medical History:  Diagnosis Date  . Arthritis   . Atrial fibrillation, persistent ([place])    a. s/p TEE-guided DCCV on 08/20/2017  . Cardiorenal syndrome    a. 09/2017 Creat rose to 2.7 w/ diuresis-->HD x 2.  . Chest pain    a. 01/2012 Cath: nonobs dzs;  c. 04/2014 MV: No ischemia.  . Chronic back pain   . Chronic combined systolic (congestive) and diastolic (congestive) heart failure ([place])    a. Dx 2005 @ [facility];  b. 02/2014 Echo: Ef 55-60%, no rwma, mild MR, PASP 55mmHg. c. 07/2017: EF 30-35%, diffuse HK, trivial AI, severe MR, moderate TR; d. 09/2017 Echo: EF 40-45%, ant, antsep, ap, basal HK. Mod MR, mod dil LA, mod RV dil w/ mildly reduced fxn, mod dil RA, mod TR, PASP 65mmHg.  [name] Chronic respiratory failure (HCC)    a. on home O2  . COPD (chronic obstructive pulmonary disease) ([place])    a. Home O2 - 3lpm  . Gastritis   . History of intracranial hemorrhage    a. 6/14 described by neurosurgery as "small frontal posttraumatic SAH " - pt was on xarelto @ the time.  . Hyperlipidemia   . Hypertension   . Lipoma of back   . Lymphedema    a. Chronic LLE edema.  [name] NICM (nonischemic cardiomyopathy) ([place])    a. 09/2017 Echo: EF 40-45%; b. 09/2017 Cath: minor, insignificant CAD.  [name] Obesity   . PAH (pulmonary artery hypertension) ([place])    a. 09/2017 Echo: PASP 2mmHg; b. 09/2017 RHC: RA 23, RV 84/13, PA 84/29, PCWP 20,  CO 3.89, CI 1.89. LVEDP 18.  . Sepsis with metabolic encephalopathy ([place])   . Sleep apnea   . Streptococcal bacteremia   . Stroke ([place])   . Symptomatic bradycardia    a. s/p BSX PPM.  . Thyroid nodule   . Type II diabetes mellitus ([place])   . Urinary incontinence    Past Surgical History:  Procedure Laterality Date  . ABDOMINAL HYSTERECTOMY  1969  . CARDIAC CATHETERIZATION  2013   @ [facility]: No obstructive CAD: Only 20% ostial left circumflex.  [name] CARDIOVERSION N/A 10/12/2017   Procedure: CARDIOVERSION;  Surgeon: Minna Merritts, MD;  Location:  [facility] ORS;  Service: Cardiovascular;  Laterality: N/A;  . CHOLECYSTECTOMY    . CYST REMOVAL TRUNK  2002   BACK  . DIALYSIS/PERMA CATHETER INSERTION N/A 10/06/2017   Procedure: DIALYSIS/PERMA CATHETER INSERTION;  Surgeon: Katha Cabal, MD;  Location: [place] CV LAB;  Service: Cardiovascular;  Laterality: N/A;  . INSERT / REPLACE / REMOVE PACEMAKER    . lipoma removal  2014   back  . PACEMAKER INSERTION  [address] [employer] dual chamber pacemaker implanted by Dr Caryl Comes for symptomatic bradycardia  . PERMANENT PACEMAKER INSERTION N/A 01/09/2014   Procedure: PERMANENT PACEMAKER INSERTION;  Surgeon: Deboraha Sprang, MD;  Location: [facility] CATH LAB;  Service: Cardiovascular;  Laterality: N/A;  . RIGHT/LEFT HEART CATH AND CORONARY ANGIOGRAPHY N/A 10/19/2017   Procedure: RIGHT/LEFT HEART CATH AND CORONARY ANGIOGRAPHY;  Surgeon: Wellington Hampshire, MD;  Location: [place] CV LAB;  Service: Cardiovascular;  Laterality: N/A;  . TEE WITHOUT CARDIOVERSION N/A 08/20/2017   Procedure: TRANSESOPHAGEAL ECHOCARDIOGRAM (TEE) & Direct current cardioversion;  Surgeon: Minna Merritts, MD;  Location: [facility] ORS;  Service: Cardiovascular;  Laterality: N/A;  . TRIGGER FINGER RELEASE    . VAGINAL HYSTERECTOMY     Family History  Problem Relation Age of Onset  . Heart disease Mother   . Hypertension Mother   . COPD Mother        was a smoker  . Thyroid disease Mother   . Hypertension Father   . Hypertension Sister   . Thyroid disease Sister   . Hypertension Sister   . Thyroid disease Sister   . Breast cancer Maternal Aunt   . Kidney disease Neg Hx   . Bladder Cancer Neg Hx    Social History   Tobacco Use  . Smoking status: Former Smoker    Packs/day: 0.30    Years: 2.00    Pack years: 0.60    Types: Cigarettes    Last attempt to quit: 06/23/1990    Years since quitting: 27.7  . Smokeless tobacco: Never Used  Substance Use Topics  . Alcohol use: No   Allergies  Allergen Reactions  .  Aspirin Hives and Other (See Comments)    Pt states that she is unable to take because she had bleeding in her brain.   Pt states that she is unable to take because she had bleeding in her brain. Pt states that she is unable to take because she had bleeding in her brain. Pt states that she is unable to take because she had bleeding in her brain.  . Other Other (See Comments)    Pt states that she is unable to take blood thinners because she had bleeding in her brain.  Blood Thinners-per doctor at [place] states that she is unable to take blood thinners because she had bleeding in her brain.  Pt states that she is unable to take blood thinners because she had bleeding in her brain.  Blood Thinners-per doctor at [place] states that she is unable to take blood thinners because she had bleeding in her brain.  Blood Thinners-per doctor at [facility]  . Penicillins Hives, Shortness Of Breath, Swelling and Other (See Comments)    Has patient had a PCN reaction causing immediate rash, facial/tongue/throat swelling, SOB or lightheadedness with hypotension: Yes Has patient had a PCN reaction causing severe rash involving mucus membranes or skin necrosis: No Has patient had a PCN reaction that required hospitalization No Has patient had a PCN reaction occurring within the last 10 years: No If all of the above answers are "NO", then may proceed with Cephalosporin use.   Prior to Admission medications   Medication Sig Start Date End Date Taking? Authorizing Provider  acetaminophen (TYLENOL) 325 MG tablet Take 325-650 mg by mouth every 6 (six) hours as needed for mild pain. Reported on 10/10/2015   Yes [provider]  albuterol (PROVENTIL) (2.5 MG/3ML) 0.083% nebulizer solution Take 3 mLs (2.5 mg total) by nebulization every 6 (six) hours. 08/28/17  Yes Wieting, Richard, MD  amiodarone (PACERONE) 200 MG tablet Take 1 tablet (200 mg total) by mouth daily. 01/15/18  Yes Theora Gianotti, NP  apixaban (ELIQUIS) 5 MG TABS tablet Take 1 tablet (5 mg total) by mouth 2 (two) times daily. 03/12/18  Yes Loletha Grayer, MD  carvedilol (COREG) 3.125 MG tablet Take 1 tablet (3.125 mg total) by mouth 2 (two) times daily with a meal. 02/23/18  Yes Theora Gianotti, NP  gabapentin (NEURONTIN) 100 MG capsule TAKE 2 CAPSULES BY MOUTH THREE TIMES DAILY 08/12/17  Yes [name], MD  glucose blood (TRUE METRIX BLOOD GLUCOSE TEST) test strip Check blood glucose twice daily Dx:E11.8, Z79.4 09/17/17  Yes [name], MD  losartan (COZAAR) 25 MG tablet Take 1 tablet (25 mg total) by mouth daily. 01/15/18  Yes Theora Gianotti, NP  Menthol, Topical Analgesic, (BIOFREEZE EX) Apply 1 application topically as needed (for pain).    Yes [provider]  metFORMIN (GLUCOPHAGE) 500 MG tablet Take 500 mg by mouth 2 (two) times daily with a meal.    Yes [provider]  ondansetron (ZOFRAN) 8 MG tablet Take 1 tablet (8 mg total) by mouth every 8 (eight) hours as needed for nausea or vomiting. 09/25/14  Yes Jackolyn Confer, MD  potassium chloride SA (K-DUR,KLOR-CON) 20 MEQ tablet Take 1 tablet (20 mEq total) by mouth 2 (two) times daily. 12/04/17  Yes Theora Gianotti, NP  rosuvastatin (CRESTOR) 10 MG tablet Take 1 tablet (10 mg total) by mouth daily. 11/30/17  Yes [name], MD  sildenafil (REVATIO) 20 MG tablet Take 20 mg by mouth 3 (three) times daily. 05/20/17  Yes [provider]  spironolactone (ALDACTONE) 25 MG tablet Take 0.5 tablets (12.5 mg total) by mouth daily. 01/07/18 04/07/18 Yes Theora Gianotti, NP  torsemide (DEMADEX) 20 MG tablet Take 1 tablet (20 mg total) by mouth 2 (two) times daily. 03/12/18 04/11/18 Yes Loletha Grayer, MD  [name] 6.60 YT/0.1SW SOPN INJECT 0.75MG  SUBCUTANEOUSLY ONCE A WEEK 12/22/17  Yes [name], MD    Review of Systems  Constitutional: Positive for appetite  change (feel full easy) and fatigue.  HENT: Negative for congestion, facial swelling and sore throat.   Eyes: Negative.   Respiratory: Positive for cough (mostly at night),  chest tightness (chest pressure) and shortness of breath (worsening).   Cardiovascular: Positive for leg swelling. Negative for chest pain and palpitations.  Gastrointestinal: Positive for abdominal distention. Negative for abdominal pain.  Endocrine: Negative.   Genitourinary: Negative.   Musculoskeletal: Negative for back pain and neck pain.  Skin: Negative.   Allergic/Immunologic: Negative.   Neurological: Positive for light-headedness. Negative for dizziness.  Hematological: Negative for adenopathy. Does not bruise/bleed easily.  Psychiatric/Behavioral: Positive for sleep disturbance (wakes up often; wearing oxygen at 4L around the clock along with CPAP). Negative for dysphoric mood. The patient is not nervous/anxious.    Vitals:   03/24/18 1255  BP: (!) 151/79  Pulse: 61  Resp: 18  SpO2: 100%  Weight: 218 lb 8 oz (99.1 kg)  Height: 5' (1.524 m)   Wt Readings from Last 3 Encounters:  03/24/18 218 lb 8 oz (99.1 kg)  03/22/18 222 lb 12.8 oz (101.1 kg)  03/15/18 216 lb (98 kg)   Lab Results  Component Value Date   CREATININE 1.10 03/22/2018   CREATININE 0.96 03/11/2018   CREATININE 0.85 03/09/2018   Physical Exam  Constitutional: She is oriented to person, place, and time. She appears well-developed and well-nourished.  HENT:  Head: Normocephalic and atraumatic.  Neck: Normal range of motion. Neck supple. No JVD present.  Cardiovascular: Regular rhythm. Bradycardia present.  Pulmonary/Chest: Effort normal. She has no wheezes. She has no rales.  Abdominal: Soft. She exhibits no distension. There is no tenderness.  Musculoskeletal: She exhibits edema (2+ pitting edema left lower leg/ none in the right lower leg). She exhibits no tenderness.  Neurological: She is alert and oriented to person, place, and  time.  Skin: Skin is warm and dry.  Psychiatric: She has a normal mood and affect. Her behavior is normal. Thought content normal.  Nursing note and vitals reviewed.  Assessment & Plan:  1: Chronic heart failure with reduced ejection fraction- - NYHA class III - mildly fluid overloaded today with lower extremity edema - weighing daily; reminded her to call for an overnight weight gain of >2 pounds or a weekly weight gain of >5 pounds - weight up 6 pounds since she was last her 4 months ago - not adding salt and she was reminded to closely follow a 2000mg  sodium diet - drinking 1 cup of coffee and 1-2 bottles of water daily  - now taking torsemide 20mg  BID as of yesterday - elevate legs when sitting for long periods of time and has been using her compression boots twice daily with good results - saw cardiology ([place]) 02/02/18 - BNP on 11/05/17 was 989.0  2: HTN- - BP mildly elevated today - saw PCP [name]) 03/22/18 - BMP on  03/22/18 reviewed and showed sodium 140, potassium 3.7, creatinine 1.10 and GFR 62.32  3: Diabetes- - fasting glucose at home was 117 today - A1c on 09/30/17 was 5.8%  4: Pulmonary HTN- - wearing oxygen at 4L around the clock - saw pulmonologist Raul Del) 12/15/17 - [facility] appointment had been cancelled and has now been rescheduled for 04/30/18  Patient did not bring her medications nor a list. Each medication was verbally reviewed with the patient and she was encouraged to bring the bottles to every visit to confirm accuracy of list.   Patient is unsure of what medications she is actually taking  Since she will be going to the [facility] in [place], will not make a return appointment here at this time.

## 2018-03-24 NOTE — Patient Instructions (Signed)
Continue weighing daily and call for an overnight weight gain of > 2 pounds or a weekly weight gain of >5 pounds. 

## 2018-03-24 NOTE — Patient Outreach (Signed)
[facility]) Care Management  03/24/2018  Debra Saunders 22-Mar-1946 [id number]  Care Coordination: Successful telephone encounter to the above patient for coordination of care related to establishing appointment with [facility]. RN CM spoke with Debra Ganong, RN at [facility] this am and obtained appointment for patient to see Dr. Pierre Bali on Friday November 8 at 9:40. RN CM gave patient appointment time and date. Will remind patient to arrange [place] transportation at next encounter as transportation cannot be arranged this far in advance. Patient verbalizes understanding.  Debra Saunders E. [name] RN, BSN [facility] Care Management Coordinator [phone]

## 2018-03-25 ENCOUNTER — Encounter: Payer: Self-pay | Admitting: Family

## 2018-03-26 ENCOUNTER — Ambulatory Visit: Payer: Medicare HMO | Admitting: Family Medicine

## 2018-03-27 DIAGNOSIS — J9611 Chronic respiratory failure with hypoxia: Secondary | ICD-10-CM | POA: Diagnosis not present

## 2018-03-29 ENCOUNTER — Ambulatory Visit: Payer: Medicare HMO

## 2018-03-31 ENCOUNTER — Encounter (HOSPITAL_COMMUNITY): Payer: Self-pay

## 2018-03-31 ENCOUNTER — Other Ambulatory Visit: Payer: Self-pay

## 2018-03-31 ENCOUNTER — Telehealth: Payer: Self-pay | Admitting: Pharmacy Technician

## 2018-03-31 ENCOUNTER — Other Ambulatory Visit (HOSPITAL_COMMUNITY): Payer: Self-pay

## 2018-03-31 NOTE — Progress Notes (Signed)
First visit today with Debra Saunders, explained the program to her and she agreed to the program.  [name] Majestic over her meds, she is out of [name] due to cost and the donut hole right now.  It is 199.45 at [place].  Talked with HF clinic and medication management about assistance. She states feeling ok today.  She has all of her other medicines.  She states she has them in a med box but gets confused sometimes.  Will start placing her meds in med box for her.  Discussed diet with her, she aware of what she should eat but dont always eat right.  She watches her fluid intake. Lungs clear, she denies chest pain, headaches, dizziness or shortness breath.  Will visit weekly and educate on diet and heart failure.   [place] [phone]

## 2018-03-31 NOTE — Patient Outreach (Signed)
[facility]) Care Management  03/31/2018  Debra Saunders [birth year] [id number]  Telephone Assessment: Successful telephone encounter to the above patient to follow up on fluid status and weight 1 week after patient begins taking Demadex as prescribed (BID).  Patient states she is doing much better. Compliant with Demadex BID. Weight today 219, down from 221 yesterday. "I am breathing a lot better". Utting services continue. Paramedicine is scheduled for initial home visit today at 12 noon and will be followed weekly. Patient also continues to check her sugars and BP daily and record. This mornings fasting CBG was 123. Her reported BP was 140/60.  Patient is concerned that she has two appointments on Friday with limited transportation. One of her appointments is for lab draws only at her PCP and the other is with Dr. Fletcher Anon. Patient is wondering if labs can be drawn at the hospital outpatient lab. RN CM collaborated with Dr. Ellen Henri office. Labs will be transferred to [facility] outpatient lab department. Patient will be contacted by Dr. Ellen Henri office sometime today to confirm scheduled lab order transfer. RN CM notified patient that she only needs transportation to and from Dr. Jacklynn Ganong office [facility]) on Friday. Patient extremely grateful for coordination of care.  Plan: Will follow up with patient in two weeks during scheduled home visit.  [facility] CM Care Plan Problem One     Most Recent Value  Care Plan Problem One  HF-High risk for admission  Role Documenting the Problem One  Care Management [place] for Problem One  Active  [facility] Long Term Goal   Patient will not have hospital readmission for HF within the next 31 days  [facility] Long Term Goal Start Date  03/15/18 [date reset to reflect post hospital discharge]  Interventions for Problem One Long Term Goal  confirmed patient is taking diuretics as prescribed, weighing daily, and monitoring BP  [facility] CM Short Term Goal #1    Over the next 30 days patient will take medications as prescribed as evidenced by patient reporting and review of EMR  [facility] CM Short Term Goal #1 Start Date  03/15/18 [date reset to reflect hospital discharge follow up]  Interventions for Short Term Goal #1  reviewed medication list today with patient  [facility] CM Short Term Goal #2   Over the next 30 days patient will attend all follow up MD visits as scheduled as evidenced by patient reporting and review of EMR  [facility] CM Short Term Goal #2 Start Date  03/15/18 [date reset to reflect post hospital discharge encounter]  Interventions for Short Term Goal #2  collaborated with PCP and lab to have appointment consolodation with lab and cardiology     Anica Alcaraz E. [name] RN, BSN [facility] Care Management Coordinator [phone]

## 2018-03-31 NOTE — Telephone Encounter (Signed)
Spoke with [employer]. Alyse Low stated that patient in coverage gap and could not afford [name].  Spoke with patient.  Explained that [facility] does not keep [name] on hand.  Have to order from [employer].  Takes 6 weeks to obtain once application and financial information is submitted.  Patient verbally acknowledged that she understood.  Patient is to gather financial information needed for [facility] program.  Patient did not want to schedule appointment while on phone.  Patient is to call me on 10/10 to schedule eligibility appointment.  [name]

## 2018-04-01 ENCOUNTER — Telehealth: Payer: Self-pay | Admitting: Pharmacy Technician

## 2018-04-01 ENCOUNTER — Other Ambulatory Visit: Payer: Self-pay | Admitting: Pharmacist

## 2018-04-01 ENCOUNTER — Ambulatory Visit: Payer: Medicare HMO

## 2018-04-01 NOTE — Telephone Encounter (Signed)
Spoke with Almyra Free Hedrick-Pharmacist-[facility].  [name] stated that she has been working with Ms. [name] for several months to try to obtain [name].  However, patient has not provided necessary documentation to obtain [name] from [employer].    Almyra Free also expressed concerns about patient being forgetful.  Is going to talk to pt's provider-Dr. Biagio Quint about memory changes that she has noticed in patient.  [facility]

## 2018-04-02 ENCOUNTER — Ambulatory Visit: Payer: Medicare HMO | Admitting: Nurse Practitioner

## 2018-04-02 ENCOUNTER — Encounter: Payer: Self-pay | Admitting: Nurse Practitioner

## 2018-04-02 ENCOUNTER — Other Ambulatory Visit: Payer: Medicare HMO

## 2018-04-02 VITALS — BP 164/80 | HR 66 | Ht 60.0 in | Wt 220.5 lb

## 2018-04-02 DIAGNOSIS — I5022 Chronic systolic (congestive) heart failure: Secondary | ICD-10-CM | POA: Diagnosis not present

## 2018-04-02 DIAGNOSIS — I4819 Other persistent atrial fibrillation: Secondary | ICD-10-CM

## 2018-04-02 DIAGNOSIS — I5043 Acute on chronic combined systolic (congestive) and diastolic (congestive) heart failure: Secondary | ICD-10-CM | POA: Diagnosis not present

## 2018-04-02 DIAGNOSIS — D649 Anemia, unspecified: Secondary | ICD-10-CM | POA: Diagnosis not present

## 2018-04-02 DIAGNOSIS — I10 Essential (primary) hypertension: Secondary | ICD-10-CM

## 2018-04-02 DIAGNOSIS — I2721 Secondary pulmonary arterial hypertension: Secondary | ICD-10-CM | POA: Diagnosis not present

## 2018-04-02 NOTE — Patient Outreach (Signed)
[facility]) Care Management  04/02/2018  Debra Saunders 02/18/1946 [id number]   Care coordination call with Debra Saunders, care manager at [facility]. Noted via chart review that Debra Saunders initiated a medication assistance assessment with Debra Saunders for [employer].   I have been working with Debra Saunders to apply for medication assistance for Eliquis and [name] since late July 2019. However, it is becoming apparent to me (as well as Debra Saunders) that patient cannot remember our multiple conversations surrounding medication affordability or the remaining paper work that she needs to provide to complete her in progress applications facilitated by Debra Saunders. Patient has repeatedly declined my reaching out to her daughter. I am very concerned for memory deficit at this point especially as patient lives alone and declines help from family. In the past she has gone >1 week without her Eliquis due to affordability but didn't tell anyone until she was hospitalized.   I will reach out to Debra Saunders's primary care provider, Debra Saunders, regarding my concerns about her memory.   Ruben Reason, PharmD Clinical Pharmacist, [facility] [phone]

## 2018-04-02 NOTE — Patient Instructions (Signed)
Medication Instructions:  Your physician has recommended you make the following change in your medication:  1- TAKE your Torsemide 40 mg (2 tablets) by mouth two times a day on Saturday and Sunday, then back to 20 mg (1 tablet) by mouth two times a day.  If you need a refill on your cardiac medications before your next appointment, please call your pharmacy.   Lab work: none If you have labs (blood work) drawn today and your tests are completely normal, you will receive your results only by: [name] MyChart Message (if you have MyChart) OR . A paper copy in the mail If you have any lab test that is abnormal or we need to change your treatment, we will call you to review the results.  Testing/Procedures: none  Follow-Up: At [facility], you and your health needs are our priority.  As part of our continuing mission to provide you with exceptional heart care, we have created designated Provider Care Teams.  These Care Teams include your primary Cardiologist (physician) and Advanced Practice Providers (APPs -  Physician Assistants and Nurse Practitioners) who all work together to provide you with the care you need, when you need it. You will need a follow up appointment in 2 weeks.  Please call our office 2 months in advance to schedule this appointment.  You may see [name], MD or one of the following Advanced Practice Providers on your designated Care Team:   Murray Hodgkins, NP Christell Faith, PA-C . Marrianne Mood, PA-C  Any Other Special Instructions Will Be Listed Below (If Applicable).  If you weight has not decreased back to your baseline, please call our office on Monday.

## 2018-04-02 NOTE — Progress Notes (Signed)
Office Visit    Patient Name: Debra Saunders Date of Encounter: 04/02/2018  Primary Care Provider:  [name], MD Primary Cardiologist:  [name], MD  Chief Complaint    72 year old female with a history of nonobstructive CAD by catheterization April 2019, chronic combined systolic and diastolic congestive heart failure, persistent atrial fibrillation on Eliquis, symptomatic bradycardia status post permanent pacemaker, frequent PVCs, chronic respiratory failure with hypoxia on home O2 secondary to COPD, pulmonary hypertension, type 2 diabetes mellitus, hypertension, hyper lipidemia, obesity, traumatic subarachnoid hemorrhage in the setting of Xarelto, left proximal DVT status post IVC filter, chronic right-sided typical chest pain, chronic left lower extremity lymphedema, prior stroke, sleep apnea, and cardiorenal syndrome, who presents for follow-up after recent hospitalization related to acute on chronic respiratory failure.  Past Medical History    Past Medical History:  Diagnosis Date  . Arthritis   . Atrial fibrillation, persistent    a. s/p TEE-guided DCCV on 08/20/2017  . Cardiorenal syndrome    a. 09/2017 Creat rose to 2.7 w/ diuresis-->HD x 2.  . Chest pain    a. 01/2012 Cath: nonobs dzs;  c. 04/2014 MV: No ischemia.  . Chronic back pain   . Chronic combined systolic (congestive) and diastolic (congestive) heart failure ([place])    a. Dx 2005 @ [facility];  b. 02/2014 Echo: Ef 55-60%, no rwma, mild MR, PASP 3mHg. c. 07/2017: EF 30-35%, diffuse HK, trivial AI, severe MR, moderate TR; d. 09/2017 Echo: EF 40-45%, ant, antsep, ap, basal HK; e. 02/2018 Echo: EF 55-60%, Gr1 DD. Ao sclerosis w/o stenosis. Mild to mod MR. Mildly dil LA. Mildly reduced RV fxn. Mild TR. PASP 35-437mg.  . Marland Kitchenhronic respiratory failure (HCC)    a. on home O2  . COPD (chronic obstructive pulmonary disease) (HCM[place]   a. Home O2 - 3lpm  . Gastritis   . History of intracranial hemorrhage    a. 6/14  described by neurosurgery as "small frontal posttraumatic SAH " - pt was on xarelto @ the time.  . Hyperlipidemia   . Hypertension   . Lipoma of back   . Lymphedema    a. Chronic LLE edema.  . Marland KitchenICM (nonischemic cardiomyopathy) ([place]   a. 09/2017 Echo: EF 40-45%; b. 09/2017 Cath: minor, insignificant CAD; c. 02/2018 Echo: EF 55-60%, Gr1 DD.  . Marland Kitchenbesity   . PAH (pulmonary artery hypertension) ([place]   a. 09/2017 Echo: PASP 6537m; b. 09/2017 RHC: RA 23, RV 84/13, PA 84/29, PCWP 20, CO 3.89, CI 1.89. LVEDP 18.  . Sepsis with metabolic encephalopathy ([place] . Sleep apnea   . Streptococcal bacteremia   . [place]roke ([place] . Symptomatic bradycardia    a. s/p BSX PPM.  . Thyroid nodule   . Type II diabetes mellitus ([place] . Urinary incontinence    Past Surgical History:  Procedure Laterality Date  . ABDOMINAL HYSTERECTOMY  1969  . CARDIAC CATHETERIZATION  [facility]   [facility]: Only 20% ostial left circumflex.  . CMarland KitchenRDIOVERSION N/A 10/12/2017   Procedure: CARDIOVERSION;  Surgeon: GolMinna MerrittsD;  Location: [facility] ORS;  Service: Cardiovascular;  Laterality: N/A;  . CHOLECYSTECTOMY    . CYST REMOVAL TRUNK  2002   BACK  . DIALYSIS/PERMA CATHETER INSERTION N/A 10/06/2017   Procedure: DIALYSIS/PERMA CATHETER INSERTION;  Surgeon: SchKatha CabalD;  [facility];  Service: Cardiovascular;  Laterality: N/A;  . INSERT / REPLACE /  REMOVE PACEMAKER    . lipoma removal  2014   back  . PACEMAKER INSERTION  [address] Scientific dual chamber pacemaker implanted by Dr Caryl Comes for symptomatic bradycardia  . PERMANENT PACEMAKER INSERTION N/A 01/09/2014   Procedure: PERMANENT PACEMAKER INSERTION;  Surgeon: Deboraha Sprang, MD;  Location: [facility] C[facility] LAB;  Service: Cardiovascular;  Laterality: N/A;  . RIGHT/LEFT HEART CATH AND CORONARY ANGIOGRAPHY N/A 10/19/2017   Procedure: RIGHT/LEFT HEART CATH AND CORONARY ANGIOGRAPHY;  Surgeon: Wellington Hampshire, MD;  Location: [place] [facility] LAB;  Service: Cardiovascular;  Laterality: N/A;  . TEE WITHOUT CARDIOVERSION N/A 08/20/2017   Procedure: TRANSESOPHAGEAL ECHOCARDIOGRAM (TEE) & Direct current cardioversion;  Surgeon: Minna Merritts, MD;  Location: [facility] ORS;  Service: Cardiovascular;  Laterality: N/A;  . TRIGGER FINGER RELEASE    . VAGINAL HYSTERECTOMY      Allergies  Allergies  Allergen Reactions  . Aspirin Hives and Other (See Comments)    Pt states that she is unable to take because she had bleeding in her brain.   Pt states that she is unable to take because she had bleeding in her brain. Pt states that she is unable to take because she had bleeding in her brain. Pt states that she is unable to take because she had bleeding in her brain.  . Other Other (See Comments)    Pt states that she is unable to take blood thinners because she had bleeding in her brain.  [place] that she is unable to take blood thinners because she had bleeding in her brain.  Pt states that she is unable to take blood thinners because she had bleeding in her brain.  Blood Thinners-per doctor at [place] states that she is unable to take blood thinners because she had bleeding in her brain.  Blood Thinners-per doctor at [facility]  . Penicillins Hives, Shortness Of Breath, Swelling and Other (See Comments)    Has patient had a PCN reaction causing immediate rash, facial/tongue/throat swelling, SOB or lightheadedness with hypotension: Yes Has patient had a PCN reaction causing severe rash involving mucus membranes or skin necrosis: No Has patient had a PCN reaction that required hospitalization No Has patient had a PCN reaction occurring within the last 10 years: No If all of the above answers are "NO", then may proceed with Cephalosporin use.    History of Present Illness    73 year old female with above complex past medical history.  Earlier this year, she was hospitalized  in February with heart failure and A. fib with RVR.  During that admission, she was diuresed and after review of prior neurosurgical records, she was placed on Eliquis.  She later underwent TEE and cardioversion and developed recurrent A. fib as well as volume overload.  She failed outpatient diuretics and required admission in April.  She underwent repeat cardioversion which was successful.  She had worsening renal failure requiring 2 sessions of hemodialysis and holding of diuretics however creatinine subsequently improved.  Right and left heart cardiac catheterization performed October 19, 2017 showed minimal nonobstructive CAD and elevated right heart pressures including a PA pressure of 84/29.  It was felt that pulmonary hypertension was out of proportion to left-sided heart failure.  She was subsequently resumed on oral torsemide therapy and was doing well when she was seen in May.  When seen in August, she had mild volume excess and she was advised to take an extra  dose of torsemide in the afternoons for weight gain.  Unfortunately, she was admitted to [facility] on September 16 with progressive volume overload.  Echocardiogram during that hospitalization showed improvement in LV function with an EF of 55 to 60% and a PASP of 35 to 40 mmHg.  She was diuresed and subsequently discharged home on September 20 at a weight of 213 pounds.  Since her discharge, she has been taking torsemide 20 mg daily but has been experiencing increasing weight gain with left greater than right lower extremity edema and dyspnea on exertion.  In that setting, she has been taking an additional 20 g of torsemide in the afternoons.  Despite this, she has not noticed any significant improvement in weight or symptoms.  She has not been having any chest pain.  She is very careful with her salt intake.  She denies PND, orthopnea, dizziness, syncope, or early satiety.  Home Medications    Prior to Admission medications     Medication Sig Start Date End Date Taking? Authorizing Provider  acetaminophen (TYLENOL) 325 MG tablet Take 325-650 mg by mouth every 6 (six) hours as needed for mild pain. Reported on 10/10/2015   Yes [provider]  albuterol (PROVENTIL) (2.5 MG/3ML) 0.083% nebulizer solution Take 3 mLs (2.5 mg total) by nebulization every 6 (six) hours. 08/28/17  Yes Wieting, Richard, MD  amiodarone (PACERONE) 200 MG tablet Take 1 tablet (200 mg total) by mouth daily. 01/15/18  Yes Theora Gianotti, NP  apixaban (ELIQUIS) 5 MG TABS tablet Take 1 tablet (5 mg total) by mouth 2 (two) times daily. 03/12/18  Yes Loletha Grayer, MD  carvedilol (COREG) 3.125 MG tablet Take 1 tablet (3.125 mg total) by mouth 2 (two) times daily with a meal. 02/23/18  Yes Theora Gianotti, NP  gabapentin (NEURONTIN) 100 MG capsule TAKE 2 CAPSULES BY MOUTH THREE TIMES DAILY 08/12/17  Yes [name], MD  glucose blood (TRUE METRIX BLOOD GLUCOSE TEST) test strip Check blood glucose twice daily Dx:E11.8, Z79.4 09/17/17  Yes [name], MD  losartan (COZAAR) 25 MG tablet Take 1 tablet (25 mg total) by mouth daily. 01/15/18  Yes Theora Gianotti, NP  Menthol, Topical Analgesic, (BIOFREEZE EX) Apply 1 application topically as needed (for pain).    Yes [provider]  metFORMIN (GLUCOPHAGE) 500 MG tablet Take 500 mg by mouth 2 (two) times daily with a meal.    Yes [provider]  ondansetron (ZOFRAN) 8 MG tablet Take 1 tablet (8 mg total) by mouth every 8 (eight) hours as needed for nausea or vomiting. 09/25/14  Yes Jackolyn Confer, MD  OXYGEN Inhale 4 L into the lungs continuous.   Yes [provider]  potassium chloride SA (K-DUR,KLOR-CON) 20 MEQ tablet Take 1 tablet (20 mEq total) by mouth 2 (two) times daily. 12/04/17  Yes Theora Gianotti, NP  rosuvastatin (CRESTOR) 10 MG tablet Take 1 tablet (10 mg total) by mouth daily. 11/30/17  Yes [name], MD   sildenafil (REVATIO) 20 MG tablet Take 20 mg by mouth 3 (three) times daily. 05/20/17  Yes [provider]  spironolactone (ALDACTONE) 25 MG tablet Take 0.5 tablets (12.5 mg total) by mouth daily. 01/07/18 04/07/18 Yes Theora Gianotti, NP  torsemide (DEMADEX) 20 MG tablet Take 1 tablet (20 mg total) by mouth 2 (two) times daily. 03/12/18 04/11/18 Yes Loletha Grayer, MD  [name] 1.61 WR/6.0AV SOPN INJECT 0.75MG SUBCUTANEOUSLY ONCE A WEEK 12/22/17  Yes [name],  [name], MD    Review of Systems    Progressive weight gain of 7 pounds along with increasing left greater than right lower extremity swelling and dyspnea on exertion, ever since her discharge in September 20.  She has not any chest pain and denies palpitations, PND, orthopnea, dizziness, syncope, or early satiety.  All other systems reviewed and are otherwise negative except as noted above.  Physical Exam    VS:  BP (!) 164/80 (BP Location: Left Arm, Patient Position: Sitting, Cuff Size: Normal)   Pulse 66   Ht 5' (1.524 m)   Wt 220 lb 8 oz (100 kg)   SpO2 94%   BMI 43.06 kg/m  , BMI Body mass index is 43.06 kg/m. GEN: Well nourished, well developed, in no acute distress. HEENT: normal. Neck: Supple, no JVD, carotid bruits, or masses. Cardiac: RRR, 2/6 systolic murmur at the lower sternal border to apex, no rubs, or gallops. No clubbing, cyanosis, 3+ left lower extremity edema with trace right lower extremity edema.  Radials/DP/PT 1+ and equal bilaterally.  Respiratory:  Respirations regular and unlabored, clear to auscultation bilaterally.  Diminished breath sounds at bases. GI: Obese, soft, nontender, nondistended, BS + x 4. MS: no deformity or atrophy. Skin: warm and dry, no rash. Neuro:  Strength and sensation are intact. Psych: Normal affect.  Accessory Clinical Findings    ECG personally reviewed by me today -atrial pacing, possible AV pacing, 66- no acute changes.  Assessment & Plan    1.   Acute on chronic combined systolic and diastolic congestive heart failure: Status post recent admission in September with diuresis down to 113 pounds.  Follow-up echo during that admission showed improvement in LV function as well as improvement in pulmonary pressures.  She has been on torsemide 20 mg daily since discharge and has been watching her salt intake closely.  She has been weighing daily and weight is up 7 pounds since discharge.  In that setting, she has been taking an additional 20 mg of torsemide most days of the week in the past week or so.  Despite this, her weight is continued to climb and she has dyspnea.  She has chronic left lower extremity swelling and this is worse currently.  I have asked her to take torsemide 40 mg twice daily for Saturday and Sunday only.  She is to contact us if her weight does not come back down.  She will otherwise remain on Spironolactone, losartan, and carvedilol.  I will plan to see her back in approximately 2 weeks or sooner if necessary.  2.  Essential hypertension: Blood pressure elevated in the setting of volume overload.  Adjusting diuretics.  Suspect pressure will improve with appropriate diuresis.  Continue beta-blocker, losartan, and Spironolactone.  3.  Persistent atrial fibrillation: Status post cardioversion x2 in the spring.  She has been maintaining sinus rhythm and remains anticoagulated with Eliquis.  4.  Mitral regurgitation: Stable by recent echo.  5.  Pulmonary hypertension: PASP 35 to 40 mmHg by recent echo.  This is improved after prior echo.  Certainly contributing to volume excess.  6.  Stage III-IV chronic kidney disease: Follow-up basic met about panel today.  7.  Hyperlipidemia: Continue statin therapy.  8.  Disposition: Follow-up basic metabolic panel today.  She will contact us on Monday if weight is not coming off despite diuresis over the weekend.  Otherwise, I will plan to see her back in 2 weeks or sooner if  necessary.  Murray Hodgkins, NP 04/02/2018, 4:57 PM

## 2018-04-03 LAB — BASIC METABOLIC PANEL
BUN/Creatinine Ratio: 17 (ref 12–28)
BUN: 19 mg/dL (ref 8–27)
CO2: 26 mmol/L (ref 20–29)
Calcium: 9.6 mg/dL (ref 8.7–10.3)
Chloride: 101 mmol/L (ref 96–106)
Creatinine, Ser: 1.09 mg/dL — ABNORMAL HIGH (ref 0.57–1.00)
GFR calc Af Amer: 58 mL/min/{1.73_m2} — ABNORMAL LOW (ref >59)
GFR calc non Af Amer: 50 mL/min/{1.73_m2} — ABNORMAL LOW (ref >59)
Glucose: 113 mg/dL — ABNORMAL HIGH (ref 65–99)
Potassium: 3.9 mmol/L (ref 3.5–5.2)
Sodium: 143 mmol/L (ref 134–144)

## 2018-04-03 LAB — CBC WITH DIFFERENTIAL/PLATELET
Basophils Absolute: 0.1 10*3/uL (ref 0.0–0.2)
Basos: 1 %
EOS (ABSOLUTE): 0.2 10*3/uL (ref 0.0–0.4)
Eos: 2 %
Hematocrit: 33.4 % — ABNORMAL LOW (ref 34.0–46.6)
Hemoglobin: 10.5 g/dL — ABNORMAL LOW (ref 11.1–15.9)
Immature Grans (Abs): 0 10*3/uL (ref 0.0–0.1)
Immature Granulocytes: 0 %
Lymphocytes Absolute: 1.8 10*3/uL (ref 0.7–3.1)
Lymphs: 27 %
MCH: 26.6 pg (ref 26.6–33.0)
MCHC: 31.4 g/dL — ABNORMAL LOW (ref 31.5–35.7)
MCV: 85 fL (ref 79–97)
Monocytes Absolute: 0.5 10*3/uL (ref 0.1–0.9)
Monocytes: 7 %
Neutrophils Absolute: 4.1 10*3/uL (ref 1.4–7.0)
Neutrophils: 63 %
Platelets: 220 10*3/uL (ref 150–450)
RBC: 3.95 x10E6/uL (ref 3.77–5.28)
RDW: 12.8 % (ref 12.3–15.4)
WBC: 6.6 10*3/uL (ref 3.4–10.8)

## 2018-04-03 LAB — IRON,TIBC AND FERRITIN PANEL
Ferritin: 70 ng/mL (ref 15–150)
Iron Saturation: 9 % — CL (ref 15–55)
Iron: 40 ug/dL (ref 27–139)
Total Iron Binding Capacity: 446 ug/dL (ref 250–450)
UIBC: 406 ug/dL — ABNORMAL HIGH (ref 118–369)

## 2018-04-05 ENCOUNTER — Telehealth: Payer: Self-pay | Admitting: *Deleted

## 2018-04-05 ENCOUNTER — Telehealth (HOSPITAL_COMMUNITY): Payer: Self-pay

## 2018-04-05 NOTE — Telephone Encounter (Signed)
It looks like 212 is likely her dry wt.  Please have her continue torsemide 40 bid for two more days.  In that setting, she should also increase kdur to 40 bid for today and tomorrow.  Would it be possible to have her seen sooner than 10/29? Preferably early next week?

## 2018-04-05 NOTE — Telephone Encounter (Signed)
Called patient. She verbalized understanding of instructions for torsemide and potassium. She is agreeable to come in 04/13/18 and aware of appointment date and time.  Also, called [place] - community paramedic - and she verbalized understanding.

## 2018-04-05 NOTE — Telephone Encounter (Signed)
Seen notes from visit with Sharolyn Douglas on Friday and wanted to check in on [place].  She states her weight is down 2 lbs since Friday, she states swelling is a little better but still has some.  She states she expected to urinate a lot more and did not.  She feels she still has a lot of fluid still left.  She advised her normal dry weight should be around 210 lbs, she was 222 lbs on Friday and today she is 219 lbs.  Contacted Jennifer at Dr Sharolyn Douglas office and advised and she advised to have her take 40 mg torsemide this morning and she will send Dr Sharolyn Douglas a message.  She did advise her labs looked good Friday if I would like to tell her.  Did contact Dyana back and advised her labs looked good and she repeated the order back of 40 mg torsemide this am.    [place] [phone]

## 2018-04-05 NOTE — Telephone Encounter (Signed)
Received incoming call from [place]. She reports patient states she was only down 2 lbs over the weekend of taking torsemide 40 mg two times a day on Saturday and Sunday. Her swelling in her legs/ankles has only decreased slightly. [name] does not know patient's normal dry weight.   Called patient.  Weight today 219.6lb 10/11  221.8lb 9/20  210lb  She denies shortness of breath. Advised her to take torsemide 40 mg this morning and I will route to [name], NP for further advice.

## 2018-04-08 ENCOUNTER — Other Ambulatory Visit: Payer: Self-pay | Admitting: Pharmacist

## 2018-04-08 ENCOUNTER — Telehealth: Payer: Self-pay

## 2018-04-08 ENCOUNTER — Other Ambulatory Visit (HOSPITAL_COMMUNITY): Payer: Self-pay

## 2018-04-08 ENCOUNTER — Encounter (HOSPITAL_COMMUNITY): Payer: Self-pay

## 2018-04-08 ENCOUNTER — Telehealth: Payer: Self-pay | Admitting: Cardiovascular Disease

## 2018-04-08 MED ORDER — METOLAZONE 2.5 MG PO TABS: 2.5000 mg | ORAL_TABLET | Freq: Every day | ORAL | 0 refills | Status: DC

## 2018-04-08 NOTE — Telephone Encounter (Signed)
Krisit called to report increase in weight in [name]. She reports that her weight has been climbing over the past few days and she is experiencing edema and abdominal distention. Her weight today is 220 lbs.   She also reports that she has increased her Torsemide to 40mg  BID, with no relief. She does not follow up with Cardiology until Tuesday and is worried it will get worse over the weekend. She is also schedule to be seen by the [facility] but is not scheduled till November.   Spoke with [name] in regards to the phone call she has prescribed Metolazone 2.5 mg for the next 2 days and increase in Potassium to 40 meq BID. Prescription called into [facility] and pt informed.   I will also inform Cardiology to have BMP drawn at visit on 04/13/2018.

## 2018-04-08 NOTE — Progress Notes (Signed)
Bp: 138/80, pulse: 60, resp: 18, spo2: 98, todays: 219 lbs bgl: Debra Saunders states her weight is still up from her normal dry weight, she has swelling in extremites more than her normal, she states her abdomen is tight.,  She has been taking extra torsemide for past week. She has been taking extra potassium with the torsemide and she has only went down 3 pounds, she is still at 219 lbs, Dr Fletcher Anon office advised on Monday that her dry weight should be around 210 lbs.  She states she is not urinating much. Contacted triage line at Dr Jacklynn Ganong office and they advised she has appt coming up on Tuesday and there is nothing sooner to get her seen.  Advised that I would reach out to HF clinic. [place] office and they advised they will call in metolazone for 2 days and take 40 meq potassium x2 a day.  Explained to Debra Saunders and she repeated it back and will get someone to pick it up for her.  Her lungs are clear, she denies chest pain, headaches or dizziness.  She states she has shortness of with activity, more than her normal, better at rest.  She states been taking all her meds.  Brought her a med box and she agreed to let me place them in med box.  Meds verfied.  She states been watching her diet and fluid intake.  Advised for her to call me with a 3 lbs weight gain overnight or 5 lbs in a week.  Went over the green, yellow and red zones, she was familiar with those.  She has home health, [place] and PT visiting also with her.  Will visit weekly to educate on heart failure.   [place] [facility] [phone]

## 2018-04-08 NOTE — Telephone Encounter (Signed)
Spoke with [employer] paramedic and she states that patients weight is up to 219 and she has been taking Torsemide 20 mg 2 tablets twice daily since her last visit. She reports that she has not been urinating at all and feels like there is still fluid on her. She reports someone mentioned her dry weight should be around 210 but I see last few entries have been 220, 219, and 222. She does have appointment coming up on Tuesday with provider and [name] is going to reach out to [employer] office to see if they have something available sooner.

## 2018-04-10 ENCOUNTER — Other Ambulatory Visit: Payer: Self-pay | Admitting: Family Medicine

## 2018-04-10 DIAGNOSIS — D649 Anemia, unspecified: Secondary | ICD-10-CM

## 2018-04-12 ENCOUNTER — Telehealth: Payer: Self-pay

## 2018-04-12 ENCOUNTER — Telehealth (HOSPITAL_COMMUNITY): Payer: Self-pay

## 2018-04-12 NOTE — Telephone Encounter (Signed)
Copied from [place] [id number]. Topic: General - Other >> Apr 12, 2018  2:10 PM Janace Aris A wrote: Reason for CRM: Merry Proud is requesting verbal orders for skilled nursing for disease management for congestive heart failure 2x a week for 3 weeks.   Also Occupational therapy to evaluate and treat.  [place] to LVM

## 2018-04-12 NOTE — Telephone Encounter (Signed)
Spoke with [name] on the phone today to make sure she did pick up metolazone and how she was doing.  She advised she did take it but weight is up to 221 lbs today, last week was 219 lbs.  She states feels ok and breathing is better today.  She has appt with cardiology tomorrow but she advised she was going to cancel it due to needing a ride.  Contacted [facility] and they are going to schedule her for tomorrow to pick up and take her.  Advised her and she was good with that, she states she will go.  Advised her she needs to see cardiology and get checked due to weight gain and taking the metolazone.  She states home health nurse advised she was dehydrated this past weekend because she states she felt bad.  New visiting with her and not quite sure what her dry weight is except for what cardiology advised 2 weeks ago.  She states she still has swelling.  Will visit with her at cardiology tomorrow.    [place] [phone]

## 2018-04-13 ENCOUNTER — Encounter: Payer: Self-pay | Admitting: Nurse Practitioner

## 2018-04-13 ENCOUNTER — Other Ambulatory Visit (HOSPITAL_COMMUNITY): Payer: Self-pay

## 2018-04-13 ENCOUNTER — Ambulatory Visit: Payer: Medicare HMO | Admitting: Nurse Practitioner

## 2018-04-13 VITALS — BP 142/64 | HR 62 | Ht 60.0 in | Wt 221.5 lb

## 2018-04-13 DIAGNOSIS — I2721 Secondary pulmonary arterial hypertension: Secondary | ICD-10-CM

## 2018-04-13 DIAGNOSIS — I10 Essential (primary) hypertension: Secondary | ICD-10-CM | POA: Diagnosis not present

## 2018-04-13 DIAGNOSIS — N183 Chronic kidney disease, stage 3 unspecified: Secondary | ICD-10-CM

## 2018-04-13 DIAGNOSIS — I5033 Acute on chronic diastolic (congestive) heart failure: Secondary | ICD-10-CM

## 2018-04-13 DIAGNOSIS — I4819 Other persistent atrial fibrillation: Secondary | ICD-10-CM | POA: Diagnosis not present

## 2018-04-13 DIAGNOSIS — I5022 Chronic systolic (congestive) heart failure: Secondary | ICD-10-CM

## 2018-04-13 MED ORDER — TORSEMIDE 20 MG PO TABS: 40.0000 mg | ORAL_TABLET | Freq: Two times a day (BID) | ORAL | 4 refills | Status: DC

## 2018-04-13 NOTE — Patient Instructions (Signed)
Medication Instructions:  Your physician has recommended you make the following change in your medication:  1- TAKE Torsemide 40 mg (2 tablets) by mouth two times a day. 2- Prescription given to patient for Lasix infusion 40 mg IV times one dose.  If you need a refill on your cardiac medications before your next appointment, please call your pharmacy.   Lab work: Your physician recommends that you return for lab work in: [place] - [facility].  If you have labs (blood work) drawn today and your tests are completely normal, you will receive your results only by: [name] MyChart Message (if you have MyChart) OR . A paper copy in the mail If you have any lab test that is abnormal or we need to change your treatment, we will call you to review the results.  Testing/Procedures: none  Follow-Up: At [facility], you and your health needs are our priority.  As part of our continuing mission to provide you with exceptional heart care, we have created designated Provider Care Teams.  These Care Teams include your primary Cardiologist (physician) and Advanced Practice Providers (APPs -  Physician Assistants and Nurse Practitioners) who all work together to provide you with the care you need, when you need it. [name] Keep your scheduled follow ups as scheduled.

## 2018-04-13 NOTE — Progress Notes (Signed)
Office Visit    Patient Name: Debra Saunders Date of Encounter: 04/13/2018  Primary Care Provider:  [name], MD Primary Cardiologist:  [name], MD  Chief Complaint    72 year old female with a history of nonobstructive CAD by catheterization April 2019, chronic combined systolic and diastolic congestive heart failure, persistent atrial fibrillation on Eliquis, symptomatic bradycardia status post permanent pacemaker, frequent PVCs, chronic respiratory failure with hypoxia on home O2 secondary to COPD, pulmonary hypertension, type 2 diabetes mellitus, hypertension, hyperlipidemia, obesity, traumatic subarachnoid hemorrhage in the setting of Xarelto, left proximal DVT status post IVC filter, chronic right-sided atypical chest pain, chronic left lower extremity lymphedema, prior stroke, sleep apnea, and cardiorenal syndrome, who presents for follow-up for management of heart failure.  Past Medical History    Past Medical History:  Diagnosis Date  . Arthritis   . Atrial fibrillation, persistent    a. s/p TEE-guided DCCV on 08/20/2017  . Cardiorenal syndrome    a. 09/2017 Creat rose to 2.7 w/ diuresis-->HD x 2.  . Chest pain    a. 01/2012 Cath: nonobs dzs;  c. 04/2014 MV: No ischemia.  . Chronic back pain   . Chronic combined systolic (congestive) and diastolic (congestive) heart failure ([place])    a. Dx 2005 @ [facility];  b. 02/2014 Echo: Ef 55-60%, no rwma, mild MR, PASP 22mmHg. c. 07/2017: EF 30-35%, diffuse HK, trivial AI, severe MR, moderate TR; d. 09/2017 Echo: EF 40-45%, ant, antsep, ap, basal HK; e. 02/2018 Echo: EF 55-60%, Gr1 DD. Ao sclerosis w/o stenosis. Mild to mod MR. Mildly dil LA. Mildly reduced RV fxn. Mild TR. PASP 35-38mmHg.  [name] Chronic respiratory failure (HCC)    a. on home O2  . COPD (chronic obstructive pulmonary disease) ([place])    a. Home O2 - 3lpm  . Gastritis   . History of intracranial hemorrhage    a. 6/14 described by neurosurgery as "small frontal  posttraumatic SAH " - pt was on xarelto @ the time.  . Hyperlipidemia   . Hypertension   . Lipoma of back   . Lymphedema    a. Chronic LLE edema.  [name] NICM (nonischemic cardiomyopathy) ([place])    a. 09/2017 Echo: EF 40-45%; b. 09/2017 Cath: minor, insignificant CAD; c. 02/2018 Echo: EF 55-60%, Gr1 DD.  [name] Obesity   . PAH (pulmonary artery hypertension) ([place])    a. 09/2017 Echo: PASP 62mmHg; b. 09/2017 RHC: RA 23, RV 84/13, PA 84/29, PCWP 20, CO 3.89, CI 1.89. LVEDP 18.  . Sepsis with metabolic encephalopathy ([place])   . Sleep apnea   . Streptococcal bacteremia   . Stroke ([place])   . Symptomatic bradycardia    a. s/p BSX PPM.  . Thyroid nodule   . Type II diabetes mellitus ([place])   . Urinary incontinence    Past Surgical History:  Procedure Laterality Date  . ABDOMINAL HYSTERECTOMY  1969  . CARDIAC CATHETERIZATION  2013   @ [place]: No obstructive CAD: Only 20% ostial left circumflex.  [name] CARDIOVERSION N/A 10/12/2017   Procedure: CARDIOVERSION;  Surgeon: Minna Merritts, MD;  Location: [facility] ORS;  Service: Cardiovascular;  Laterality: N/A;  . CHOLECYSTECTOMY    . CYST REMOVAL TRUNK  2002   BACK  . DIALYSIS/PERMA CATHETER INSERTION N/A 10/06/2017   Procedure: DIALYSIS/PERMA CATHETER INSERTION;  Surgeon: Katha Cabal, MD;  Location: [place] CV LAB;  Service: Cardiovascular;  Laterality: N/A;  . INSERT / REPLACE / REMOVE PACEMAKER    .  lipoma removal  2014   back  . PACEMAKER INSERTION  [address] [employer] dual chamber pacemaker implanted by Dr Caryl Comes for symptomatic bradycardia  . PERMANENT PACEMAKER INSERTION N/A 01/09/2014   Procedure: PERMANENT PACEMAKER INSERTION;  Surgeon: Deboraha Sprang, MD;  Location: [facility] CATH LAB;  Service: Cardiovascular;  Laterality: N/A;  . RIGHT/LEFT HEART CATH AND CORONARY ANGIOGRAPHY N/A 10/19/2017   Procedure: RIGHT/LEFT HEART CATH AND CORONARY ANGIOGRAPHY;  Surgeon: Wellington Hampshire, MD;  Location: [place] CV LAB;  Service: Cardiovascular;   Laterality: N/A;  . TEE WITHOUT CARDIOVERSION N/A 08/20/2017   Procedure: TRANSESOPHAGEAL ECHOCARDIOGRAM (TEE) & Direct current cardioversion;  Surgeon: Minna Merritts, MD;  Location: [facility] ORS;  Service: Cardiovascular;  Laterality: N/A;  . TRIGGER FINGER RELEASE    . VAGINAL HYSTERECTOMY      Allergies  Allergies  Allergen Reactions  . Aspirin Hives and Other (See Comments)    Pt states that she is unable to take because she had bleeding in her brain.   Pt states that she is unable to take because she had bleeding in her brain. Pt states that she is unable to take because she had bleeding in her brain. Pt states that she is unable to take because she had bleeding in her brain.  . Other Other (See Comments)    Pt states that she is unable to take blood thinners because she had bleeding in her brain.  Blood Thinners-per doctor at [facility] states that she is unable to take blood thinners because she had bleeding in her brain.  Pt states that she is unable to take blood thinners because she had bleeding in her brain.  Blood Thinners-per doctor at [place] states that she is unable to take blood thinners because she had bleeding in her brain.  Blood Thinners-per doctor at [facility]  . Penicillins Hives, Shortness Of Breath, Swelling and Other (See Comments)    Has patient had a PCN reaction causing immediate rash, facial/tongue/throat swelling, SOB or lightheadedness with hypotension: Yes Has patient had a PCN reaction causing severe rash involving mucus membranes or skin necrosis: No Has patient had a PCN reaction that required hospitalization No Has patient had a PCN reaction occurring within the last 10 years: No If all of the above answers are "NO", then may proceed with Cephalosporin use.    History of Present Illness    72 year old female with above complex past medical history.  Earlier this year, she was hospitalized in February with heart failure and A. fib  with RVR.  During that admission, she was diuresed and after review of prior neurosurgical records, she was placed on Eliquis.  She later underwent TEE and cardioversion and developed recurrent A. fib as well as volume overload.  She failed outpatient diuretics and required admission in April.  She underwent repeat cardioversion which was successful.  She had worsening renal failure requiring 2 sessions of hemodialysis and holding of diuretics however creatinine subsequently improved.  Right and left heart cardiac catheterization performed October 19, 2017 showed minimal nonobstructive CAD and elevated right heart pressures including a PA pressure of 84/29.  It was felt that pulmonary hypertension was out of proportion to left-sided heart failure.  She was subsequently resumed on oral torsemide therapy and was doing well when she was seen in May.  When seen in August, she had mild volume excess and she was advised to take an extra dose of torsemide in the afternoons  for weight gain.  Unfortunately, she was admitted to [facility] on September 16 with progressive volume overload.  Echocardiogram during that hospitalization showed improvement in LV function with an EF of 55 to 60% and a PASP of 35 to 40 mmHg.  She was diuresed and subsequently discharged home on September 20 at a weight of 213 pounds.  I saw her in clinic on October 11 and her weight was up 7 pounds to 220 pounds and she also noticed increasing dyspnea on exertion as well as left greater than right lower extremity swelling.  She was taking torsemide 20 mg twice daily without any improvement in weights or symptoms.  I had her increase torsemide to 40 mg twice daily over last weekend and obtain lab work which showed stable renal function.  We touched base with her last Monday and she indicated that her weight had not changed.  I recommend continuation of torsemide 40 mg twice daily with the addition of oral potassium until her follow-up today.   She has also been contacted by our paramedicine team.  Due to worsening weight gain, on October 17 she was advised to take metolazone 2.5 mg x 2 days.  She did this over the weekend without any significant improvement.  Weight is up to 221.8 pounds today.  She continues to have dyspnea on exertion.  She denies chest pain, PND, orthopnea, dizziness, syncope, or early satiety.  She says she remains very careful about her salt and fluid intake.  Home Medications    Prior to Admission medications   Medication Sig Start Date End Date Taking? Authorizing Provider  acetaminophen (TYLENOL) 325 MG tablet Take 325-650 mg by mouth every 6 (six) hours as needed for mild pain. Reported on 10/10/2015   Yes [provider]  albuterol (PROVENTIL) (2.5 MG/3ML) 0.083% nebulizer solution Take 3 mLs (2.5 mg total) by nebulization every 6 (six) hours. 08/28/17  Yes Wieting, Richard, MD  amiodarone (PACERONE) 200 MG tablet Take 1 tablet (200 mg total) by mouth daily. 01/15/18  Yes Theora Gianotti, NP  apixaban (ELIQUIS) 5 MG TABS tablet Take 1 tablet (5 mg total) by mouth 2 (two) times daily. 03/12/18  Yes Loletha Grayer, MD  carvedilol (COREG) 3.125 MG tablet Take 1 tablet (3.125 mg total) by mouth 2 (two) times daily with a meal. 02/23/18  Yes Theora Gianotti, NP  gabapentin (NEURONTIN) 100 MG capsule TAKE 2 CAPSULES BY MOUTH THREE TIMES DAILY 08/12/17  Yes [name], MD  glucose blood (TRUE METRIX BLOOD GLUCOSE TEST) test strip Check blood glucose twice daily Dx:E11.8, Z79.4 09/17/17  Yes [name], MD  losartan (COZAAR) 25 MG tablet Take 1 tablet (25 mg total) by mouth daily. 01/15/18  Yes Theora Gianotti, NP  Menthol, Topical Analgesic, (BIOFREEZE EX) Apply 1 application topically as needed (for pain).    Yes [provider]  metFORMIN (GLUCOPHAGE) 500 MG tablet Take 500 mg by mouth 2 (two) times daily with a meal.    Yes [provider]  ondansetron  (ZOFRAN) 8 MG tablet Take 1 tablet (8 mg total) by mouth every 8 (eight) hours as needed for nausea or vomiting. 09/25/14  Yes Jackolyn Confer, MD  OXYGEN Inhale 4 L into the lungs continuous.   Yes [provider]  potassium chloride SA (K-DUR,KLOR-CON) 20 MEQ tablet Take 1 tablet (20 mEq total) by mouth 2 (two) times daily. 12/04/17  Yes Theora Gianotti, NP  rosuvastatin (CRESTOR) 10 MG tablet  Take 1 tablet (10 mg total) by mouth daily. 11/30/17  Yes [name], MD  sildenafil (REVATIO) 20 MG tablet Take 20 mg by mouth 3 (three) times daily. 05/20/17  Yes [provider]  [name] 6.31 SH/7.72YO SOPN INJECT 0.75MG  SUBCUTANEOUSLY ONCE A WEEK 12/22/17  Yes [name], MD  spironolactone (ALDACTONE) 25 MG tablet Take 0.5 tablets (12.5 mg total) by mouth daily. 01/07/18 04/08/18  Theora Gianotti, NP  torsemide (DEMADEX) 20 MG tablet Take 1 tablet (20 mg total) by mouth 2 (two) times daily. 03/12/18 04/11/18  Loletha Grayer, MD    Review of Systems    As above, weight has been very slow to come off and is even up a pound today.  She continues to have dyspnea with left greater than right lower extremity swelling.  She denies chest pain, palpitations, PND, orthopnea, dizziness, syncope, or early satiety.  All other systems reviewed and are otherwise negative except as noted above.  Physical Exam    VS:  BP (!) 142/64 (BP Location: Left Arm, Patient Position: Sitting, Cuff Size: Normal)   Pulse 62   Ht 5' (1.524 m)   Wt 221 lb 8 oz (100.5 kg)   BMI 43.26 kg/m  , BMI Body mass index is 43.26 kg/m. GEN: Well nourished, well developed, in no acute distress. HEENT: normal. Neck: Supple, no JVD, carotid bruits, or masses. Cardiac: RRR, 2/6 systolic murmur at the lower sternal border to apex, no rubs, or gallops. No clubbing, cyanosis, 2-3+ left lower extremity edema with no right lower extremity edema.  Radials/DP/PT 1 + and equal bilaterally.    Respiratory:  Respirations regular and unlabored, clear to auscultation bilaterally. GI: Obese, soft, nontender, nondistended, BS + x 4. MS: no deformity or atrophy. Skin: warm and dry, no rash. Neuro:  Strength and sensation are intact. Psych: Normal affect.  Accessory Clinical Findings    ECG personally reviewed by me today -A paced, V sensed, 62, inferolateral T wave inversion- no acute changes.  Assessment & Plan    1.  Acute on chronic diastolic congestive heart failure: Patient was seen on October 11 with 7 pound weight gain and we adjusted diuretics including doubling torsemide from 20 twice daily to 40 twice daily.  We initially only plan to do this for 2 days but she had poor response and the higher dose was continued.  Over this past weekend, she was prescribed metolazone x2 days by the heart failure clinic without significant response.  Today, [name] w/ paramedicine is present for her clinic visit.  Ms. Delisle responded well to intravenous Lasix but has not been doing well with oral therapy as an outpatient.  I am going to check a basic metabolic panel today and have provided an order to para medicine for Lasix 40 mill grams IV x1 tomorrow presuming renal function looks okay today.  Our goal here is to avoid rehospitalization.  Ms. Checketts will continue to weigh herself daily and assess her response to diuretics.  I am hopeful that Lasix will be successful and getting off some of this weight and that the higher dose of torsemide will hope to keep it off.  She has follow-up with heart failure clinic in the setting of pulmonary hypertension in 2 weeks.  If she has poor response to IV Lasix, we will plan for follow-up sooner than that though, she may require admission.  She remains on beta-blocker, ARB, Spironolactone, and sildenafil.  2.  Essential hypertension: Blood pressure  mildly elevated in the setting of mild volume overload.  We will see how she does with IV Lasix.  Continue  beta-blocker, losartan, Spironolactone, and torsemide.  3.  Persistent atrial fibrillation: Status post cardioversion x2 in the spring.  She is maintaining sinus with atrial pacing and V sensing today.  Cont eliquis.  4.  Mitral regurgitation: Stable by recent echo.  5.  Pulmonary hypertension: PASP 35 to 40 mmHg by recent echo in September.  She is on sildenafil and has follow-up in our [facility] heart failure clinic.  6.  Stage III-IV chronic kidney disease: Following up basic metabolic panel today.  Renal function was stable on recheck last week.  7.  Hyperlipidemia: Continue statin therapy.  8.  Iron deficiency anemia:  She has f/u with GI and heme.  9.  Disposition: Follow-up basic metabolic panel today.  Provided that this is stable, she will be given a dose of IV Lasix at home tomorrow.  Follow-up with heart failure clinic in 2 weeks though we may need to see her sooner if no significant response to IV Lasix tomorrow.  Murray Hodgkins, NP 04/13/2018, 4:00 PM

## 2018-04-13 NOTE — Progress Notes (Signed)
Todays visit was with Debra Saunders at cardiology department.  Debra Saunders has been having trouble with weight gain in past week or so.  New seeing her, Debra Saunders advised her dry weight should be around 212 lbs.  He states torsemide and metolazone just not working, he would like to do iv lasix.  She is having blood work done today but due to transportation issues riding [place] me an order to give 40 mg iv lasix tomorrow at her house.  She is to continue torsemide at 40 twice a day at home.  Keep an eye on weight and let him know if weight does not go down, for her to see him next week. She understands and states she will watch her fluids and diet.  She complains of short of breath with activity more than her normal. Will continue to visit weekly with her to educate on heart failure.   [facility] Marshall Chester EMT-Paramedic [phone]

## 2018-04-13 NOTE — Telephone Encounter (Signed)
Verbal orders to be given.  Thanks.

## 2018-04-13 NOTE — Telephone Encounter (Signed)
Debra Saunders and left a detailed VM with pt's name and DOB giving Merry Proud the [place] for verbal orders. Left my name and call back number if needed for [facility].

## 2018-04-13 NOTE — Telephone Encounter (Signed)
Sent to PCP for the OK for verbal orders  

## 2018-04-14 ENCOUNTER — Other Ambulatory Visit (HOSPITAL_COMMUNITY): Payer: Self-pay

## 2018-04-14 ENCOUNTER — Telehealth: Payer: Self-pay | Admitting: *Deleted

## 2018-04-14 ENCOUNTER — Encounter (HOSPITAL_COMMUNITY): Payer: Self-pay

## 2018-04-14 DIAGNOSIS — I5033 Acute on chronic diastolic (congestive) heart failure: Secondary | ICD-10-CM

## 2018-04-14 LAB — BASIC METABOLIC PANEL
BUN/Creatinine Ratio: 25 (ref 12–28)
BUN: 46 mg/dL — ABNORMAL HIGH (ref 8–27)
CO2: 31 mmol/L — ABNORMAL HIGH (ref 20–29)
Calcium: 10 mg/dL (ref 8.7–10.3)
Chloride: 92 mmol/L — ABNORMAL LOW (ref 96–106)
Creatinine, Ser: 1.85 mg/dL — ABNORMAL HIGH (ref 0.57–1.00)
GFR calc Af Amer: 30 mL/min/{1.73_m2} — ABNORMAL LOW (ref >59)
GFR calc non Af Amer: 26 mL/min/{1.73_m2} — ABNORMAL LOW (ref >59)
Glucose: 94 mg/dL (ref 65–99)
Potassium: 3.8 mmol/L (ref 3.5–5.2)
Sodium: 142 mmol/L (ref 134–144)

## 2018-04-14 NOTE — Progress Notes (Signed)
Bp: 144/86, pulse: 60, resp: 18, spo2: 97, last weight: 219 lbs, today: 219 lbs, bgl: 123  Visit today with [place] she advised lost 2 lbs overnight. Spoke with Lattie Haw at cardiology for her to review [facility] blood work before giving the Lasix today.  After Lattie Haw spoke with Doctor they advised to give the 40 mg lasix IV and have her come in for blood work on Monday.  She agreed to get a ride for Monday to get blood work done.  Saline lock placed and 40 mg  Iv lasix given with no edema while administering the medication, flushed with 10 cc saline and dc'd saline lock, holding pressure for 2 minutes and placing dressing afterwards.  Meds verified and med box refilled. Pt is taking potassium 20 meq x3 daily.  Denies chest pain, some dizziness when stands, states she has headache today, lungs clear.  Breathing good today.  Discussed fluid intake and importance of drinking enough fluid and not too much.  She states she has only been drinking about 16 ounces a day.  Discussed diet and importance of it.  Lungs are clear. She weighs daily and charts it. Will continue to visit weekly to educate on heart failure.   [place] [phone]

## 2018-04-14 NOTE — Telephone Encounter (Signed)
[name], EMT, called requesting guidance about giving the patient the 40 mg IV furosemide today since her creatinine level had increased to 1.85 yesterday. This was prescribed at her office visit yesterday. [name] had also mentioned that she was down 2 pounds overnight.  According to DOD, the patient should still have the IV Furosemide but come back on Monday for a repeat BMET.   [name] has verbalized her understanding and orders have been placed.

## 2018-04-15 ENCOUNTER — Other Ambulatory Visit: Payer: Self-pay | Admitting: Pharmacy Technician

## 2018-04-15 ENCOUNTER — Encounter (HOSPITAL_COMMUNITY): Payer: Self-pay | Admitting: Emergency Medicine

## 2018-04-15 ENCOUNTER — Encounter (HOSPITAL_COMMUNITY): Payer: Self-pay

## 2018-04-15 ENCOUNTER — Inpatient Hospital Stay (HOSPITAL_COMMUNITY)
Admission: EM | Admit: 2018-04-15 | Discharge: 2018-04-20 | DRG: 286 | Disposition: A | Discharge status: Home Health | Payer: Medicare HMO | Attending: Family Medicine | Admitting: Family Medicine

## 2018-04-15 ENCOUNTER — Other Ambulatory Visit: Payer: Self-pay

## 2018-04-15 ENCOUNTER — Telehealth: Payer: Self-pay | Admitting: *Deleted

## 2018-04-15 ENCOUNTER — Emergency Department (HOSPITAL_COMMUNITY): Payer: Medicare HMO

## 2018-04-15 ENCOUNTER — Other Ambulatory Visit (HOSPITAL_COMMUNITY): Payer: Self-pay

## 2018-04-15 DIAGNOSIS — I272 Pulmonary hypertension, unspecified: Secondary | ICD-10-CM

## 2018-04-15 DIAGNOSIS — I4819 Other persistent atrial fibrillation: Secondary | ICD-10-CM | POA: Diagnosis present

## 2018-04-15 DIAGNOSIS — R001 Bradycardia, unspecified: Secondary | ICD-10-CM | POA: Diagnosis present

## 2018-04-15 DIAGNOSIS — J9621 Acute and chronic respiratory failure with hypoxia: Secondary | ICD-10-CM | POA: Diagnosis present

## 2018-04-15 DIAGNOSIS — J9611 Chronic respiratory failure with hypoxia: Secondary | ICD-10-CM | POA: Diagnosis present

## 2018-04-15 DIAGNOSIS — E039 Hypothyroidism, unspecified: Secondary | ICD-10-CM | POA: Diagnosis present

## 2018-04-15 DIAGNOSIS — I509 Heart failure, unspecified: Secondary | ICD-10-CM

## 2018-04-15 DIAGNOSIS — G8929 Other chronic pain: Secondary | ICD-10-CM | POA: Diagnosis present

## 2018-04-15 DIAGNOSIS — I2721 Secondary pulmonary arterial hypertension: Secondary | ICD-10-CM | POA: Diagnosis present

## 2018-04-15 DIAGNOSIS — N183 Chronic kidney disease, stage 3 (moderate): Secondary | ICD-10-CM | POA: Diagnosis not present

## 2018-04-15 DIAGNOSIS — Z880 Allergy status to penicillin: Secondary | ICD-10-CM

## 2018-04-15 DIAGNOSIS — E1122 Type 2 diabetes mellitus with diabetic chronic kidney disease: Secondary | ICD-10-CM | POA: Diagnosis present

## 2018-04-15 DIAGNOSIS — Z8249 Family history of ischemic heart disease and other diseases of the circulatory system: Secondary | ICD-10-CM

## 2018-04-15 DIAGNOSIS — Z6841 Body Mass Index (BMI) 40.0 and over, adult: Secondary | ICD-10-CM | POA: Diagnosis not present

## 2018-04-15 DIAGNOSIS — R609 Edema, unspecified: Secondary | ICD-10-CM | POA: Diagnosis not present

## 2018-04-15 DIAGNOSIS — I480 Paroxysmal atrial fibrillation: Secondary | ICD-10-CM | POA: Diagnosis not present

## 2018-04-15 DIAGNOSIS — Z7901 Long term (current) use of anticoagulants: Secondary | ICD-10-CM

## 2018-04-15 DIAGNOSIS — Z79899 Other long term (current) drug therapy: Secondary | ICD-10-CM

## 2018-04-15 DIAGNOSIS — I110 Hypertensive heart disease with heart failure: Secondary | ICD-10-CM | POA: Diagnosis not present

## 2018-04-15 DIAGNOSIS — M1990 Unspecified osteoarthritis, unspecified site: Secondary | ICD-10-CM | POA: Diagnosis present

## 2018-04-15 DIAGNOSIS — Z9981 Dependence on supplemental oxygen: Secondary | ICD-10-CM

## 2018-04-15 DIAGNOSIS — Z8673 Personal history of transient ischemic attack (TIA), and cerebral infarction without residual deficits: Secondary | ICD-10-CM

## 2018-04-15 DIAGNOSIS — M549 Dorsalgia, unspecified: Secondary | ICD-10-CM | POA: Diagnosis present

## 2018-04-15 DIAGNOSIS — N179 Acute kidney failure, unspecified: Secondary | ICD-10-CM | POA: Diagnosis present

## 2018-04-15 DIAGNOSIS — R0602 Shortness of breath: Secondary | ICD-10-CM | POA: Diagnosis not present

## 2018-04-15 DIAGNOSIS — J9610 Chronic respiratory failure, unspecified whether with hypoxia or hypercapnia: Secondary | ICD-10-CM | POA: Diagnosis not present

## 2018-04-15 DIAGNOSIS — I4891 Unspecified atrial fibrillation: Secondary | ICD-10-CM | POA: Diagnosis present

## 2018-04-15 DIAGNOSIS — Z87891 Personal history of nicotine dependence: Secondary | ICD-10-CM

## 2018-04-15 DIAGNOSIS — Z825 Family history of asthma and other chronic lower respiratory diseases: Secondary | ICD-10-CM

## 2018-04-15 DIAGNOSIS — Z90710 Acquired absence of both cervix and uterus: Secondary | ICD-10-CM

## 2018-04-15 DIAGNOSIS — I130 Hypertensive heart and chronic kidney disease with heart failure and stage 1 through stage 4 chronic kidney disease, or unspecified chronic kidney disease: Principal | ICD-10-CM | POA: Diagnosis present

## 2018-04-15 DIAGNOSIS — Z8349 Family history of other endocrine, nutritional and metabolic diseases: Secondary | ICD-10-CM

## 2018-04-15 DIAGNOSIS — J449 Chronic obstructive pulmonary disease, unspecified: Secondary | ICD-10-CM | POA: Diagnosis present

## 2018-04-15 DIAGNOSIS — G4733 Obstructive sleep apnea (adult) (pediatric): Secondary | ICD-10-CM | POA: Diagnosis present

## 2018-04-15 DIAGNOSIS — J8410 Pulmonary fibrosis, unspecified: Secondary | ICD-10-CM | POA: Diagnosis not present

## 2018-04-15 DIAGNOSIS — Z95828 Presence of other vascular implants and grafts: Secondary | ICD-10-CM

## 2018-04-15 DIAGNOSIS — I5043 Acute on chronic combined systolic (congestive) and diastolic (congestive) heart failure: Secondary | ICD-10-CM | POA: Diagnosis present

## 2018-04-15 DIAGNOSIS — E1140 Type 2 diabetes mellitus with diabetic neuropathy, unspecified: Secondary | ICD-10-CM | POA: Diagnosis present

## 2018-04-15 DIAGNOSIS — I2510 Atherosclerotic heart disease of native coronary artery without angina pectoris: Secondary | ICD-10-CM | POA: Diagnosis present

## 2018-04-15 DIAGNOSIS — I10 Essential (primary) hypertension: Secondary | ICD-10-CM | POA: Diagnosis present

## 2018-04-15 DIAGNOSIS — Z950 Presence of cardiac pacemaker: Secondary | ICD-10-CM

## 2018-04-15 DIAGNOSIS — I5033 Acute on chronic diastolic (congestive) heart failure: Secondary | ICD-10-CM | POA: Diagnosis not present

## 2018-04-15 DIAGNOSIS — D519 Vitamin B12 deficiency anemia, unspecified: Secondary | ICD-10-CM | POA: Diagnosis present

## 2018-04-15 DIAGNOSIS — E785 Hyperlipidemia, unspecified: Secondary | ICD-10-CM | POA: Diagnosis present

## 2018-04-15 DIAGNOSIS — I428 Other cardiomyopathies: Secondary | ICD-10-CM | POA: Diagnosis present

## 2018-04-15 DIAGNOSIS — Z888 Allergy status to other drugs, medicaments and biological substances status: Secondary | ICD-10-CM

## 2018-04-15 DIAGNOSIS — I5031 Acute diastolic (congestive) heart failure: Secondary | ICD-10-CM | POA: Diagnosis not present

## 2018-04-15 DIAGNOSIS — Z86718 Personal history of other venous thrombosis and embolism: Secondary | ICD-10-CM

## 2018-04-15 DIAGNOSIS — Z9049 Acquired absence of other specified parts of digestive tract: Secondary | ICD-10-CM

## 2018-04-15 DIAGNOSIS — R600 Localized edema: Secondary | ICD-10-CM | POA: Diagnosis not present

## 2018-04-15 DIAGNOSIS — J849 Interstitial pulmonary disease, unspecified: Secondary | ICD-10-CM | POA: Diagnosis not present

## 2018-04-15 DIAGNOSIS — Z7984 Long term (current) use of oral hypoglycemic drugs: Secondary | ICD-10-CM

## 2018-04-15 DIAGNOSIS — Z803 Family history of malignant neoplasm of breast: Secondary | ICD-10-CM

## 2018-04-15 LAB — COMPREHENSIVE METABOLIC PANEL
ALT: 17 U/L (ref 0–44)
AST: 27 U/L (ref 15–41)
Albumin: 3.7 g/dL (ref 3.5–5.0)
Alkaline Phosphatase: 106 U/L (ref 38–126)
Anion gap: 10 (ref 5–15)
BUN: 47 mg/dL — ABNORMAL HIGH (ref 8–23)
CO2: 32 mmol/L (ref 22–32)
Calcium: 9.9 mg/dL (ref 8.9–10.3)
Chloride: 98 mmol/L (ref 98–111)
Creatinine, Ser: 1.84 mg/dL — ABNORMAL HIGH (ref 0.44–1.00)
GFR calc Af Amer: 30 mL/min — ABNORMAL LOW (ref >60)
GFR calc non Af Amer: 26 mL/min — ABNORMAL LOW (ref >60)
Glucose, Bld: 89 mg/dL (ref 70–99)
Potassium: 3.9 mmol/L (ref 3.5–5.1)
Sodium: 140 mmol/L (ref 135–145)
Total Bilirubin: 0.7 mg/dL (ref 0.3–1.2)
Total Protein: 7.2 g/dL (ref 6.5–8.1)

## 2018-04-15 LAB — CBC
HCT: 36.6 % (ref 36.0–46.0)
Hemoglobin: 10.6 g/dL — ABNORMAL LOW (ref 12.0–15.0)
MCH: 25.9 pg — ABNORMAL LOW (ref 26.0–34.0)
MCHC: 29 g/dL — ABNORMAL LOW (ref 30.0–36.0)
MCV: 89.5 fL (ref 80.0–100.0)
Platelets: 234 10*3/uL (ref 150–400)
RBC: 4.09 MIL/uL (ref 3.87–5.11)
RDW: 13.1 % (ref 11.5–15.5)
WBC: 6.2 10*3/uL (ref 4.0–10.5)
nRBC: 0 % (ref 0.0–0.2)

## 2018-04-15 LAB — GLUCOSE, CAPILLARY: Glucose-Capillary: 83 mg/dL (ref 70–99)

## 2018-04-15 LAB — BRAIN NATRIURETIC PEPTIDE: B Natriuretic Peptide: 35.8 pg/mL (ref 0.0–100.0)

## 2018-04-15 LAB — I-STAT TROPONIN, ED: Troponin i, poc: 0.02 ng/mL (ref 0.00–0.08)

## 2018-04-15 MED ORDER — ALBUTEROL SULFATE (2.5 MG/3ML) 0.083% IN NEBU: 2.5000 mg | INHALATION_SOLUTION | Freq: Four times a day (QID) | RESPIRATORY_TRACT | Status: DC | PRN

## 2018-04-15 MED ORDER — FUROSEMIDE 10 MG/ML IJ SOLN
60.0000 mg | Freq: Two times a day (BID) | INTRAMUSCULAR | Status: DC
  Administered 2018-04-16: 60 mg via INTRAVENOUS
  Filled 2018-04-15: qty 6

## 2018-04-15 MED ORDER — SODIUM CHLORIDE 0.9% FLUSH
3.0000 mL | Freq: Two times a day (BID) | INTRAVENOUS | Status: DC
  Administered 2018-04-16: 3 mL via INTRAVENOUS

## 2018-04-15 MED ORDER — ONDANSETRON HCL 4 MG/2ML IJ SOLN: 4.0000 mg | Freq: Four times a day (QID) | INTRAMUSCULAR | Status: DC | PRN

## 2018-04-15 MED ORDER — ACETAMINOPHEN 325 MG PO TABS
650.0000 mg | ORAL_TABLET | Freq: Once | ORAL | Status: AC
  Administered 2018-04-15: 650 mg via ORAL
  Filled 2018-04-15: qty 2

## 2018-04-15 MED ORDER — SPIRONOLACTONE 12.5 MG HALF TABLET
12.5000 mg | ORAL_TABLET | Freq: Every day | ORAL | Status: DC
  Administered 2018-04-16 – 2018-04-20 (×5): 12.5 mg via ORAL
  Filled 2018-04-15 (×5): qty 1

## 2018-04-15 MED ORDER — FUROSEMIDE 10 MG/ML IJ SOLN
40.0000 mg | Freq: Once | INTRAMUSCULAR | Status: AC
  Administered 2018-04-15: 40 mg via INTRAVENOUS
  Filled 2018-04-15: qty 4

## 2018-04-15 MED ORDER — SODIUM CHLORIDE 0.9% FLUSH: 3.0000 mL | INTRAVENOUS | Status: DC | PRN

## 2018-04-15 MED ORDER — APIXABAN 5 MG PO TABS
5.0000 mg | ORAL_TABLET | Freq: Two times a day (BID) | ORAL | Status: DC
  Administered 2018-04-15 – 2018-04-18 (×6): 5 mg via ORAL
  Filled 2018-04-15 (×6): qty 1

## 2018-04-15 MED ORDER — POTASSIUM CHLORIDE CRYS ER 20 MEQ PO TBCR
20.0000 meq | EXTENDED_RELEASE_TABLET | Freq: Two times a day (BID) | ORAL | Status: DC
  Administered 2018-04-15 – 2018-04-20 (×10): 20 meq via ORAL
  Filled 2018-04-15 (×10): qty 1

## 2018-04-15 MED ORDER — CARVEDILOL 3.125 MG PO TABS
3.1250 mg | ORAL_TABLET | Freq: Two times a day (BID) | ORAL | Status: DC
  Administered 2018-04-16: 3.125 mg via ORAL
  Filled 2018-04-15: qty 1

## 2018-04-15 MED ORDER — AMIODARONE HCL 200 MG PO TABS
200.0000 mg | ORAL_TABLET | Freq: Every day | ORAL | Status: DC
  Administered 2018-04-16 – 2018-04-19 (×4): 200 mg via ORAL
  Filled 2018-04-15 (×5): qty 1

## 2018-04-15 MED ORDER — GABAPENTIN 100 MG PO CAPS
200.0000 mg | ORAL_CAPSULE | Freq: Three times a day (TID) | ORAL | Status: DC
  Administered 2018-04-15 – 2018-04-20 (×14): 200 mg via ORAL
  Filled 2018-04-15 (×14): qty 2

## 2018-04-15 MED ORDER — ACETAMINOPHEN 325 MG PO TABS
650.0000 mg | ORAL_TABLET | ORAL | Status: DC | PRN
  Administered 2018-04-16 – 2018-04-19 (×6): 650 mg via ORAL
  Filled 2018-04-15 (×6): qty 2

## 2018-04-15 MED ORDER — SODIUM CHLORIDE 0.9 % IV SOLN: 250.0000 mL | INTRAVENOUS | Status: DC | PRN

## 2018-04-15 MED ORDER — ACETAMINOPHEN 325 MG PO TABS: 325.0000 mg | ORAL_TABLET | Freq: Four times a day (QID) | ORAL | Status: DC | PRN

## 2018-04-15 MED ORDER — SILDENAFIL CITRATE 20 MG PO TABS
20.0000 mg | ORAL_TABLET | Freq: Three times a day (TID) | ORAL | Status: DC
  Administered 2018-04-15 – 2018-04-20 (×14): 20 mg via ORAL
  Filled 2018-04-15 (×17): qty 1

## 2018-04-15 MED ORDER — ROSUVASTATIN CALCIUM 10 MG PO TABS
10.0000 mg | ORAL_TABLET | Freq: Every day | ORAL | Status: DC
  Administered 2018-04-16 – 2018-04-20 (×5): 10 mg via ORAL
  Filled 2018-04-15 (×5): qty 1

## 2018-04-15 MED ORDER — ONDANSETRON HCL 4 MG PO TABS: 8.0000 mg | ORAL_TABLET | Freq: Three times a day (TID) | ORAL | Status: DC | PRN

## 2018-04-15 MED ORDER — LOSARTAN POTASSIUM 25 MG PO TABS
25.0000 mg | ORAL_TABLET | Freq: Every day | ORAL | Status: DC
  Administered 2018-04-16: 25 mg via ORAL
  Filled 2018-04-15: qty 1

## 2018-04-15 NOTE — Patient Outreach (Addendum)
Debra Saunders) Care Management  04/15/2018  Debra Saunders 06-29-1945 [id number]  72 y.o. year old female referred to [facility] pharmacist for medication assistance.   Referral source: [employer] Advancing You Referral medication(s): Eliquis, [name] Current insurance:[employer] Medicare Currently receiving Extra Help:  []  Yes [x]  No []  Unknown   Following up with patient to obtain TROOP from [facility] necessary for MAP applications. Patient consented to [facility] pharmacist accessing her online [employer] portal. Provided password.   I have downloaded patient's TROOP information and provided it to Etter Sjogren, [place] for applications we have started for Debra Saunders.   [name] will submit applications and follow up as necessary with [employer] (Eliquis) and [employer] ([name]).   Ruben Reason, PharmD Clinical Pharmacist, [facility] [phone]

## 2018-04-15 NOTE — Telephone Encounter (Signed)
Spoke with [employer] paramedic and she states that she administered 40 mg IV lasix yesterday and patient also took metolazone over the weekend. She reports that patient is due to have labs on Monday but was concerned because her weight is up from 212 dry weight to 222. She states that her weights are not going down at all. Advised that I would route message over to provider for further recommendations and would give her a call back. She verbalized understanding with no further questions at this time.

## 2018-04-15 NOTE — Patient Outreach (Signed)
[facility]) Care Management  04/15/2018  Debra Saunders 1945-09-01 [id number]   Received patients [employer] EOB, submitted completed applications and required documents to [employer] ([name]) and  [employer] (Eliquis)  Will follow up with companies in 7-10 business days to check the status of applications.  Maud Deed [place], [place] Management [phone]

## 2018-04-15 NOTE — ED Notes (Signed)
200 output, 0 ml post void bladder scan

## 2018-04-15 NOTE — Telephone Encounter (Signed)
With bump in creatinine, insignificant response to lasix, and ongoing weight gain (10 lbs above baseline) I'm concerned that she will need admission for more consistent IV diuresis and close monitoring of renal fxn.

## 2018-04-15 NOTE — Consult Note (Addendum)
Cardiology Consultation:   Patient ID: Debra Saunders MRN: [id number]; DOB: [birth year]  Admit date: 04/15/2018 Date of Consult: 04/15/2018  Primary Care Provider: [name], MD Primary Cardiologist: [name], MD  Primary Electrophysiologist:  None    Patient Profile:   Debra Saunders is a 72 y.o. female with a hx of chronic diastolic HF who is being seen today for the evaluation of SOB and weight gain  at the request of Dr Jonelle Sidle.  History of Present Illness:   [name] 72 yo female history of nonobstructive CAD by cath 09/979, chronic diastolic HF, afib on eliquis, bradycardia s/p pacemaker placement, chronic respiratory failure with hypoxia on home O2 secondary to COPD, pulm HTN on sildenafil, DM2, HTN, cardiorenal syndrome, lymphedema presents with SOB and edema  From notes admitted 09/2017 with CHF, severe carediorenal syndrome requiring HD x 2.  Seen in clinic Oct 11 with 7 lbs weigh gain to 220 lbs, increaing DOE. Torsemide increased from 20mg  daily to 40mg  bid without improvement. Oct 17 took metolazone 2.5mg  x 2 days without improvement. Weight up to 221 lbs. Was able to get home lasix 40mg  IV x1 on 10/23 with limited urine output.   Reports weight gain, increasing orthopnea, SOB, leg edema (L>R which is chronic for her), and abomdinal distension. Significant difference since her discharge 03/12/18.     WBC 6.2 Hgb 10.6 Plt 234 K 3.9 Cr 1.84 (up from 0.96 one month ago) BNP 35  Trop neg EKG paced rhythm  09/2017 RHC/LHC: no significant CAD RA 23, PA 84/29 mean 48 PCWP 20,, LVEDP 18, CI 1.89  02/2018 echo LVEF 19-14%, grade I diastolic dysfunction, mild to mod MR, mild RV dysfunction, PASP 35-40    Past Medical History:  Diagnosis Date  . Arthritis   . Atrial fibrillation, persistent    a. s/p TEE-guided DCCV on 08/20/2017  . Cardiorenal syndrome    a. 09/2017 Creat rose to 2.7 w/ diuresis-->HD x 2.  . Chest pain    a. 01/2012 Cath: nonobs dzs;  c.  04/2014 MV: No ischemia.  . Chronic back pain   . Chronic combined systolic (congestive) and diastolic (congestive) heart failure ([place])    a. Dx 2005 @ [facility];  b. 02/2014 Echo: Ef 55-60%, no rwma, mild MR, PASP 19mmHg. c. 07/2017: EF 30-35%, diffuse HK, trivial AI, severe MR, moderate TR; d. 09/2017 Echo: EF 40-45%, ant, antsep, ap, basal HK; e. 02/2018 Echo: EF 55-60%, Gr1 DD. Ao sclerosis w/o stenosis. Mild to mod MR. Mildly dil LA. Mildly reduced RV fxn. Mild TR. PASP 35-68mmHg.  [name] Chronic respiratory failure (HCC)    a. on home O2  . COPD (chronic obstructive pulmonary disease) ([place])    a. Home O2 - 3lpm  . Gastritis   . History of intracranial hemorrhage    a. 6/14 described by neurosurgery as "small frontal posttraumatic SAH " - pt was on xarelto @ the time.  . Hyperlipidemia   . Hypertension   . Lipoma of back   . Lymphedema    a. Chronic LLE edema.  [name] NICM (nonischemic cardiomyopathy) ([place])    a. 09/2017 Echo: EF 40-45%; b. 09/2017 Cath: minor, insignificant CAD; c. 02/2018 Echo: EF 55-60%, Gr1 DD.  [name] Obesity   . PAH (pulmonary artery hypertension) ([place])    a. 09/2017 Echo: PASP 62mmHg; b. 09/2017 RHC: RA 23, RV 84/13, PA 84/29, PCWP 20, CO 3.89, CI 1.89. LVEDP 18.  . Sepsis with metabolic encephalopathy ([place])   .  Sleep apnea   . Streptococcal bacteremia   . Stroke ([place])   . Symptomatic bradycardia    a. s/p BSX PPM.  . Thyroid nodule   . Type II diabetes mellitus ([place])   . Urinary incontinence     Past Surgical History:  Procedure Laterality Date  . ABDOMINAL HYSTERECTOMY  1969  . CARDIAC CATHETERIZATION  2013   @ [place]: No obstructive CAD: Only 20% ostial left circumflex.  [name] CARDIOVERSION N/A 10/12/2017   Procedure: CARDIOVERSION;  Surgeon: Minna Merritts, MD;  Location: [facility] ORS;  Service: Cardiovascular;  Laterality: N/A;  . CHOLECYSTECTOMY    . CYST REMOVAL TRUNK  2002   BACK  . DIALYSIS/PERMA CATHETER INSERTION N/A 10/06/2017   Procedure: DIALYSIS/PERMA CATHETER  INSERTION;  Surgeon: Katha Cabal, MD;  Location: [place] CV LAB;  Service: Cardiovascular;  Laterality: N/A;  . INSERT / REPLACE / REMOVE PACEMAKER    . lipoma removal  2014   back  . PACEMAKER INSERTION  [address] Scientific dual chamber pacemaker implanted by Dr Caryl Comes for symptomatic bradycardia  . PERMANENT PACEMAKER INSERTION N/A 01/09/2014   Procedure: PERMANENT PACEMAKER INSERTION;  Surgeon: Deboraha Sprang, MD;  Location: [facility] CATH LAB;  Service: Cardiovascular;  Laterality: N/A;  . RIGHT/LEFT HEART CATH AND CORONARY ANGIOGRAPHY N/A 10/19/2017   Procedure: RIGHT/LEFT HEART CATH AND CORONARY ANGIOGRAPHY;  Surgeon: Wellington Hampshire, MD;  Location: [place] CV LAB;  Service: Cardiovascular;  Laterality: N/A;  . TEE WITHOUT CARDIOVERSION N/A 08/20/2017   Procedure: TRANSESOPHAGEAL ECHOCARDIOGRAM (TEE) & Direct current cardioversion;  Surgeon: Minna Merritts, MD;  Location: [facility] ORS;  Service: Cardiovascular;  Laterality: N/A;  . TRIGGER FINGER RELEASE    . VAGINAL HYSTERECTOMY       Inpatient Medications: Scheduled Meds: . furosemide  40 mg Intravenous Once   Continuous Infusions:  PRN Meds:   Allergies:    Allergies  Allergen Reactions  . Aspirin Hives and Other (See Comments)    Pt states that she is unable to take because she had bleeding in her brain.   Pt states that she is unable to take because she had bleeding in her brain. Pt states that she is unable to take because she had bleeding in her brain. Pt states that she is unable to take because she had bleeding in her brain.  . Other Other (See Comments)    Pt states that she is unable to take blood thinners because she had bleeding in her brain.  Blood Thinners-per doctor at [place] states that she is unable to take blood thinners because she had bleeding in her brain.  Pt states that she is unable to take blood thinners because she had bleeding in her brain.  Blood Thinners-per doctor  at [place] states that she is unable to take blood thinners because she had bleeding in her brain.  Blood Thinners-per doctor at [facility]  . Penicillins Hives, Shortness Of Breath, Swelling and Other (See Comments)    Has patient had a PCN reaction causing immediate rash, facial/tongue/throat swelling, SOB or lightheadedness with hypotension: Yes Has patient had a PCN reaction causing severe rash involving mucus membranes or skin necrosis: No Has patient had a PCN reaction that required hospitalization No Has patient had a PCN reaction occurring within the last 10 years: No If all of the above answers are "NO", then may proceed with Cephalosporin use.    Social History:   Social History  Socioeconomic History  . Marital status: Divorced    Spouse name: Not on file  . Number of children: 4  . Years of education: 28  . Highest education level: High school graduate  Occupational History  . Occupation: Retired- factory work    Comment: exposed to [place]  . Financial resource strain: Not hard at all  . Food insecurity:    Worry: Never true    Inability: Never true  . Transportation needs:    Medical: Yes    Non-medical: Yes  Tobacco Use  . Smoking status: Former Smoker    Packs/day: 0.30    Years: 2.00    Pack years: 0.60    Types: Cigarettes    Last attempt to quit: 06/23/1990    Years since quitting: 27.8  . Smokeless tobacco: Never Used  Substance and Sexual Activity  . Alcohol use: No  . Drug use: No  . Sexual activity: Not Currently  Lifestyle  . Physical activity:    Days per week: 0 days    Minutes per session: 0 min  . Stress: To some extent  Relationships  . Social connections:    Talks on phone: More than three times a week    Gets together: Once a week    Attends religious service: Never    Active member of club or organization: No    Attends meetings of clubs or organizations: Never    Relationship status: Divorced  . Intimate  partner violence:    Fear of current or ex partner: Patient refused    Emotionally abused: Patient refused    Physically abused: Patient refused    Forced sexual activity: Patient refused  Other Topics Concern  . Not on file  Social History Narrative   Lives in [place] with daughter. Has 4 children. No pets.      Work - Restaurant manager, fast food, retired      Diet - regular    Family History:    Family History  Problem Relation Age of Onset  . Heart disease Mother   . Hypertension Mother   . COPD Mother        was a smoker  . Thyroid disease Mother   . Hypertension Father   . Hypertension Sister   . Thyroid disease Sister   . Hypertension Sister   . Thyroid disease Sister   . Breast cancer Maternal Aunt   . Kidney disease Neg Hx   . Bladder Cancer Neg Hx      ROS:  Please see the history of present illness.  All other ROS reviewed and negative.     Physical Exam/Data:   Vitals:   04/15/18 1558 04/15/18 1600  BP: 128/70   Pulse: 60   Resp: 10   Temp: 98.3 F (36.8 C)   TempSrc: Oral   SpO2: 100%   Weight:  100.8 kg  Height:  5' (1.524 m)   No intake or output data in the 24 hours ending 04/15/18 1910 Filed Weights   04/15/18 1600  Weight: 100.8 kg   Body mass index is 43.38 kg/m.  General:  Well nourished, well developed, in no acute distress HEENT: normal Lymph: no adenopathy Neck: no JVD Endocrine:  No thryomegaly Vascular: No carotid bruits; FA pulses 2+ bilaterally without bruits  Cardiac:  normal S1, S2; 2/6 systolic murmur rusb, no jvd  Lungs:  clear to auscultation bilaterally, no wheezing, rhonchi or rales  Abd: soft, nontender, no hepatomegaly  Ext:  2+ nonpitting left sided edema.  Musculoskeletal:  No deformities, BUE and BLE strength normal and equal Skin: warm and dry  Neuro:  CNs 2-12 intact, no focal abnormalities noted Psych:  Normal affect   Laboratory Data:  Chemistry Recent Labs  Lab 04/13/18 1455 04/15/18 1618  NA 142 140  K 3.8  3.9  CL 92* 98  CO2 31* 32  GLUCOSE 94 89  BUN 46* 47*  CREATININE 1.85* 1.84*  CALCIUM 10.0 9.9  GFRNONAA 26* 26*  GFRAA 30* 30*  ANIONGAP  --  10    Recent Labs  Lab 04/15/18 1618  PROT 7.2  ALBUMIN 3.7  AST 27  ALT 17  ALKPHOS 106  BILITOT 0.7   Hematology Recent Labs  Lab 04/15/18 1618  WBC 6.2  RBC 4.09  HGB 10.6*  HCT 36.6  MCV 89.5  MCH 25.9*  MCHC 29.0*  RDW 13.1  PLT 234   Cardiac EnzymesNo results for input(s): TROPONINI in the last 168 hours.  Recent Labs  Lab 04/15/18 1629  TROPIPOC 0.02    BNP Recent Labs  Lab 04/15/18 1618  BNP 35.8    DDimer No results for input(s): DDIMER in the last 168 hours.  Radiology/Studies:  Dg Chest 2 View  Result Date: 04/15/2018 CLINICAL DATA:  Acute shortness of breath for 2 days. EXAM: CHEST - 2 VIEW COMPARISON:  03/08/2018 FINDINGS: Cardiomegaly and LEFT-sided pacemaker again noted. RIGHT hemidiaphragm elevation is identified. Pulmonary vascular congestion noted with interstitial and patchy airspace opacities. No pleural effusion or pneumothorax. No acute bony abnormality identified. IMPRESSION: Cardiomegaly with pulmonary vascular congestion with interstitial and patchy airspace opacities. These opacities are favored to represent edema but infection/pneumonia is not excluded. Electronically Signed   By: Margarette Canada M.D.   On: 04/15/2018 17:04    Assessment and Plan:   1. Acute on chronic diastolic HF with pulmonary HTN and RV dysfunction - 02/2018 echo normal LVEF, diastolic dysfunction, and RV dysfunction. RV poorly visualzied - prior RHC with mean PA 48, PCWP 20, trans pulm grad 28. Treated for mixed pre and postcapillary pulmonary HTN. She has been on sildenafil. Unclear if precapillary is related to vascular remodeling from left sided disease or separate cause. She does have chronic lung disease with O2 dependent COPD as well as OSA.   - pattern of volume overload and uptrend Cr with failure diurese  concerning for RV failure. Mixed findings with normal BNP but edema on CXR, +LE edema without clear JVD, body habitus limits exam.  - will try IV diuresis tonight, would recommend CHF team evaluation - repeat echo since clinical decline since 02/2018 study, focus on RV function.   - received IV lasix 40mg  in ER already. This dose was not effective with home IV lasix yesterday, will dose additional 40mg  tonight, hold diuretics tomorrow and reasses.    2. Afib - continue amio, coreg, eliquis   3. Bradycardia/pacemaker - no active acute issues.   4. AKI - elevated Cr, unclear if from venous congestion and CHF vs related to recent aggressive attempts at diuresis - follow Cr trend with continued diuresis today.    For questions or updates, please contact [place] Please consult [email or web address] for contact info under     Signed, Carlyle Dolly, MD  04/15/2018 7:10 PM

## 2018-04-15 NOTE — Telephone Encounter (Signed)
Spoke with [name] and reviewed recommendations for patient to go to ED for admission for more consistent IV diuresis and monitoring. She verbalized understanding with no further questions. Called ED Charge Nurse Colletta Maryland and made her aware that patient will be coming in.

## 2018-04-15 NOTE — Progress Notes (Signed)
Bp: 104/60, pulse: 60, resp: 18, spo2: 96, last weight: 219, todays: 222 bgl: 123  Today talking to Debra Saunders she has gained 3 lbs after she was given IV lasix yesterday.  Contacted Cardiology to see about getting her appt and while I was waiting for contact back was contacted by [place] on scene with Debra Saunders stating she has not urinated but 3 times since lasix was given.  Advised cardiology and Debra Saunders advised pt needed to be admitted due to her kidney function labs and fluid.  Debra Saunders and she was refusing over the phone so I went out to visit. She is agreeing to go now, she has contacted her family.  She has an appt with Debra Saunders at [facility] early November and after consulting with Debra Saunders cardiology thought it would be best for her to go to [facility] and be evaluated.  Dr Debra Saunders has referred her to [facility] due to her pulmonary hypertension.  She denies shortness of breath at rest, only with activity she gets short of breath more than her normal.  Lungs are clear.  She states no headache today but she has dizziness when stands. She has taken all her meds today up to this point. She has been watching her fluids better.   Ambulance was contacted for transport and she was transported to [facility].  Will continue to visit weekly once she is out of Sa[place].   [place] [phone]

## 2018-04-15 NOTE — H&P (Signed)
History and Physical   Debra Saunders IRJ:[id number] DOB: 1946-02-05 DOA: 04/15/2018  Referring MD/NP/PA: Dr. Gilford Raid  PCP: [name], MD   Outpatient Specialists:  Dr. Fletcher Anon with Cardiology at [facility]  Patient coming from: Home  Chief Complaint: Leg swelling and shortness of breath  HPI: Debra Saunders is a 72 y.o. female with medical history significant of diastolic dysfunction CHF, atrial fibrillation on chronic Eliquis, history of cardiorenal syndrome requiring hemodialysis in the past, COPD, hypertension, morbid obesity, diabetes, hyperlipidemia and previous CVA who came to the ER complaining of shortness of breath and lower extremity edema.  Patient has been dealing with excess fluid with worsening edema just last week.  She was seen by her cardiologist who increased her oral diuretic dose a week ago.  Patient did not get adequate urinary response and did not have any decrease in her fluid status.  She has gained significant weight since then.  She is on home O2 at 4 L and has been requiring even more.  She called her cardiologist today who recommended hospital admissions with IV diuretics.  Patient's creatinine has slightly worsened over her baseline.  With her history of previous cardiorenal syndrome and failing to respond to oral diuretics she is being admitted for further work-up..  ED Course: Temperature is 98.3 blood pressure 132/73 pulse 60 respiratory rate of 21 oxygen sat 96% on 4 L.  White count is 6.2 with hemoglobin 10.6 and platelet of 234.  Sodium 140 potassium 3.9 chloride is 98 with CO2 32 BUN 47 creatinine 1.84 and calcium 9.9.  Chest x-ray showed Cardiomegaly with pulmonary vascular congestion with interstitial and patchy airspace opacities. These opacities are favored to represent edema but infection/pneumonia is not excluded.  Patient is therefore being admitted for evaluation of refractory CHF.  Review of Systems: As per HPI otherwise 10 point review of  systems negative.    Past Medical History:  Diagnosis Date  . Arthritis   . Atrial fibrillation, persistent    a. s/p TEE-guided DCCV on 08/20/2017  . Cardiorenal syndrome    a. 09/2017 Creat rose to 2.7 w/ diuresis-->HD x 2.  . Chest pain    a. 01/2012 Cath: nonobs dzs;  c. 04/2014 MV: No ischemia.  . Chronic back pain   . Chronic combined systolic (congestive) and diastolic (congestive) heart failure ([place])    a. Dx 2005 @ [facility];  b. 02/2014 Echo: Ef 55-60%, no rwma, mild MR, PASP 63mmHg. c. 07/2017: EF 30-35%, diffuse HK, trivial AI, severe MR, moderate TR; d. 09/2017 Echo: EF 40-45%, ant, antsep, ap, basal HK; e. 02/2018 Echo: EF 55-60%, Gr1 DD. Ao sclerosis w/o stenosis. Mild to mod MR. Mildly dil LA. Mildly reduced RV fxn. Mild TR. PASP 35-70mmHg.  [name] Chronic respiratory failure (HCC)    a. on home O2  . COPD (chronic obstructive pulmonary disease) ([place])    a. Home O2 - 3lpm  . Gastritis   . History of intracranial hemorrhage    a. 6/14 described by neurosurgery as "small frontal posttraumatic SAH " - pt was on xarelto @ the time.  . Hyperlipidemia   . Hypertension   . Lipoma of back   . Lymphedema    a. Chronic LLE edema.  [name] NICM (nonischemic cardiomyopathy) ([place])    a. 09/2017 Echo: EF 40-45%; b. 09/2017 Cath: minor, insignificant CAD; c. 02/2018 Echo: EF 55-60%, Gr1 DD.  [name] Obesity   . PAH (pulmonary artery hypertension) ([place])    a. 09/2017 Echo:  PASP 37mmHg; b. 09/2017 RHC: RA 23, RV 84/13, PA 84/29, PCWP 20, CO 3.89, CI 1.89. LVEDP 18.  . Sepsis with metabolic encephalopathy ([place])   . Sleep apnea   . Streptococcal bacteremia   . Stroke ([place])   . Symptomatic bradycardia    a. s/p BSX PPM.  . Thyroid nodule   . Type II diabetes mellitus ([place])   . Urinary incontinence     Past Surgical History:  Procedure Laterality Date  . ABDOMINAL HYSTERECTOMY  1969  . CARDIAC CATHETERIZATION  2013   @ [place]: No obstructive CAD: Only 20% ostial left circumflex.  [name] CARDIOVERSION N/A  10/12/2017   Procedure: CARDIOVERSION;  Surgeon: Minna Merritts, MD;  Location: [facility] ORS;  Service: Cardiovascular;  Laterality: N/A;  . CHOLECYSTECTOMY    . CYST REMOVAL TRUNK  2002   BACK  . DIALYSIS/PERMA CATHETER INSERTION N/A 10/06/2017   Procedure: DIALYSIS/PERMA CATHETER INSERTION;  Surgeon: Katha Cabal, MD;  Location: [place];  Service: Cardiovascular;  Laterality: N/A;  . INSERT / REPLACE / REMOVE PACEMAKER    . lipoma removal  2014   back  . PACEMAKER INSERTION  [address] Scientific dual chamber pacemaker implanted by Dr Caryl Comes for symptomatic bradycardia  . PERMANENT PACEMAKER INSERTION N/A 01/09/2014   Procedure: PERMANENT PACEMAKER INSERTION;  Surgeon: Deboraha Sprang, MD;  Location: [facility] CATH LAB;  Service: Cardiovascular;  Laterality: N/A;  . RIGHT/LEFT HEART CATH AND CORONARY ANGIOGRAPHY N/A 10/19/2017   Procedure: RIGHT/LEFT HEART CATH AND CORONARY ANGIOGRAPHY;  Surgeon: Wellington Hampshire, MD;  Location: [place] [facility];  Service: Cardiovascular;  Laterality: N/A;  . TEE WITHOUT CARDIOVERSION N/A 08/20/2017   Procedure: TRANSESOPHAGEAL ECHOCARDIOGRAM (TEE) & Direct current cardioversion;  Surgeon: Minna Merritts, MD;  Location: [facility] ORS;  Service: Cardiovascular;  Laterality: N/A;  . TRIGGER FINGER RELEASE    . VAGINAL HYSTERECTOMY       reports that she quit smoking about 27 years ago. Her smoking use included cigarettes. She has a 0.60 pack-year smoking history. She has never used smokeless tobacco. She reports that she does not drink alcohol or use drugs.  Allergies  Allergen Reactions  . Aspirin Hives and Other (See Comments)    Pt states that she is unable to take because she had bleeding in her brain  . Other Other (See Comments)    Pt states that she is unable to take blood thinners because she had bleeding in her brain; Blood Thinners-per doctor at [facility]  . Penicillins Hives, Shortness Of Breath, Swelling and Other (See Comments)      Has patient had a PCN reaction causing immediate rash, facial/tongue/throat swelling, SOB or lightheadedness with hypotension: Yes Has patient had a PCN reaction causing severe rash involving mucus membranes or skin necrosis: No Has patient had a PCN reaction that required hospitalization No Has patient had a PCN reaction occurring within the last 10 years: No If all of the above answers are "NO", then may proceed with Cephalosporin use.    Family History  Problem Relation Age of Onset  . Heart disease Mother   . Hypertension Mother   . COPD Mother        was a smoker  . Thyroid disease Mother   . Hypertension Father   . Hypertension Sister   . Thyroid disease Sister   . Hypertension Sister   . Thyroid disease Sister   . Breast cancer Maternal Aunt   . Kidney  disease Neg Hx   . Bladder Cancer Neg Hx      Prior to Admission medications   Medication Sig Start Date End Date Taking? Authorizing Provider  acetaminophen (TYLENOL) 325 MG tablet Take 325-650 mg by mouth every 6 (six) hours as needed for mild pain. Reported on 10/10/2015   Yes [provider]  albuterol (PROVENTIL) (2.5 MG/3ML) 0.083% nebulizer solution Take 3 mLs (2.5 mg total) by nebulization every 6 (six) hours. Patient taking differently: Take 2.5 mg by nebulization every 6 (six) hours as needed for wheezing or shortness of breath.  08/28/17  Yes Wieting, Richard, MD  amiodarone (PACERONE) 200 MG tablet Take 1 tablet (200 mg total) by mouth daily. 01/15/18  Yes Theora Gianotti, NP  apixaban (ELIQUIS) 5 MG TABS tablet Take 1 tablet (5 mg total) by mouth 2 (two) times daily. 03/12/18  Yes Loletha Grayer, MD  carvedilol (COREG) 3.125 MG tablet Take 1 tablet (3.125 mg total) by mouth 2 (two) times daily with a meal. 02/23/18  Yes Theora Gianotti, NP  gabapentin (NEURONTIN) 100 MG capsule TAKE 2 CAPSULES BY MOUTH THREE TIMES DAILY Patient taking differently: Take 200 mg by mouth 3 (three) times  daily.  08/12/17  Yes [name], MD  losartan (COZAAR) 25 MG tablet Take 1 tablet (25 mg total) by mouth daily. 01/15/18  Yes Theora Gianotti, NP  Menthol, Topical Analgesic, (BIOFREEZE EX) Apply 1 application topically as needed (for pain).    Yes [provider]  metFORMIN (GLUCOPHAGE) 500 MG tablet Take 500 mg by mouth 2 (two) times daily with a meal.    Yes [provider]  ondansetron (ZOFRAN) 8 MG tablet Take 1 tablet (8 mg total) by mouth every 8 (eight) hours as needed for nausea or vomiting. 09/25/14  Yes Jackolyn Confer, MD  OXYGEN Inhale 4 L into the lungs continuous.   Yes [provider]  potassium chloride SA (K-DUR,KLOR-CON) 20 MEQ tablet Take 1 tablet (20 mEq total) by mouth 2 (two) times daily. 12/04/17  Yes Theora Gianotti, NP  rosuvastatin (CRESTOR) 10 MG tablet Take 1 tablet (10 mg total) by mouth daily. 11/30/17  Yes [name], MD  sildenafil (REVATIO) 20 MG tablet Take 20 mg by mouth 3 (three) times daily. 05/20/17  Yes [provider]  spironolactone (ALDACTONE) 25 MG tablet Take 0.5 tablets (12.5 mg total) by mouth daily. 01/07/18 04/15/18 Yes Theora Gianotti, NP  torsemide (DEMADEX) 20 MG tablet Take 2 tablets (40 mg total) by mouth 2 (two) times daily. 04/13/18 07/12/18 Yes Theora Gianotti, NP  [name] 9.32 TF/5.7DU SOPN INJECT 0.75MG  SUBCUTANEOUSLY ONCE A WEEK Patient taking differently: Inject 0.75 mg into the skin every Sunday.  12/22/17  Yes [name], MD  glucose blood (TRUE METRIX BLOOD GLUCOSE TEST) test strip Check blood glucose twice daily Dx:E11.8, Z79.4 09/17/17   [name], MD    Physical Exam: Vitals:   04/15/18 1800 04/15/18 1830 04/15/18 1915 04/15/18 2040  BP: 115/61 132/73 129/61 (!) 106/48  Pulse: 60 60 (!) 59 (!) 59  Resp: (!) 21 15 20 18   Temp:    98.4 F (36.9 C)  TempSrc:    Oral  SpO2: 100% 100% 100% 100%  Weight:    99 kg  Height:     5' (1.524 m)      Constitutional: NAD, calm, comfortable Vitals:   04/15/18 1800 04/15/18 1830 04/15/18 1915 04/15/18 2040  BP: 115/61 132/73  129/61 (!) 106/48  Pulse: 60 60 (!) 59 (!) 59  Resp: (!) 21 15 20 18   Temp:    98.4 F (36.9 C)  TempSrc:    Oral  SpO2: 100% 100% 100% 100%  Weight:    99 kg  Height:    5' (1.524 m)   Eyes: PERRL, lids and conjunctivae normal ENMT: Mucous membranes are moist. Posterior pharynx clear of any exudate or lesions.Normal dentition.  Neck: normal, supple, no masses, no thyromegaly Respiratory: Bilateral lower lobe crackles. Normal respiratory effort. No accessory muscle use.  Cardiovascular: Regular rate and rhythm, no murmurs / rubs / gallops. 1+ extremity edema. 2+ pedal pulses. No carotid bruits.  Abdomen: no tenderness, no masses palpated. No hepatosplenomegaly. Bowel sounds positive.  Musculoskeletal: no clubbing / cyanosis. No joint deformity upper and lower extremities. Good ROM, no contractures. Normal muscle tone.  Skin: no rashes, lesions, ulcers. No induration Neurologic: CN 2-12 grossly intact. Sensation intact, DTR normal. Strength 5/5 in all 4.  Psychiatric: Normal judgment and insight. Alert and oriented x 3. Normal mood.     Labs on Admission: I have personally reviewed following labs and imaging studies  CBC: Recent Labs  Lab 04/15/18 1618  WBC 6.2  HGB 10.6*  HCT 36.6  MCV 89.5  PLT 503   Basic Metabolic Panel: Recent Labs  Lab 04/13/18 1455 04/15/18 1618  NA 142 140  K 3.8 3.9  CL 92* 98  CO2 31* 32  GLUCOSE 94 89  BUN 46* 47*  CREATININE 1.85* 1.84*  CALCIUM 10.0 9.9   GFR: Estimated Creatinine Clearance: 28.3 mL/min (A) (by C-G formula based on SCr of 1.84 mg/dL (H)). Liver Function Tests: Recent Labs  Lab 04/15/18 1618  AST 27  ALT 17  ALKPHOS 106  BILITOT 0.7  PROT 7.2  ALBUMIN 3.7   No results for input(s): LIPASE, AMYLASE in the last 168 hours. No results for input(s): AMMONIA in the  last 168 hours. Coagulation Profile: No results for input(s): INR, PROTIME in the last 168 hours. Cardiac Enzymes: No results for input(s): CKTOTAL, CKMB, CKMBINDEX, TROPONINI in the last 168 hours. BNP (last 3 results) No results for input(s): PROBNP in the last 8760 hours. HbA1C: No results for input(s): HGBA1C in the last 72 hours. CBG: No results for input(s): GLUCAP in the last 168 hours. Lipid Profile: No results for input(s): CHOL, HDL, LDLCALC, TRIG, CHOLHDL, LDLDIRECT in the last 72 hours. Thyroid Function Tests: No results for input(s): TSH, T4TOTAL, FREET4, T3FREE, THYROIDAB in the last 72 hours. Anemia Panel: No results for input(s): VITAMINB12, FOLATE, FERRITIN, TIBC, IRON, RETICCTPCT in the last 72 hours. Urine analysis:    Component Value Date/Time   COLORURINE YELLOW (A) 10/19/2017 0124   APPEARANCEUR CLEAR (A) 10/19/2017 0124   APPEARANCEUR Clear 11/18/2013 0443   LABSPEC 1.013 10/19/2017 0124   LABSPEC 1.049 11/18/2013 0443   PHURINE 6.0 10/19/2017 0124   GLUCOSEU NEGATIVE 10/19/2017 0124   GLUCOSEU Negative 11/18/2013 0443   HGBUR NEGATIVE 10/19/2017 0124   BILIRUBINUR NEGATIVE 10/19/2017 0124   BILIRUBINUR small 03/10/2016 1431   BILIRUBINUR Negative 11/18/2013 0443   KETONESUR NEGATIVE 10/19/2017 0124   PROTEINUR NEGATIVE 10/19/2017 0124   UROBILINOGEN 1.0 03/10/2016 1431   NITRITE NEGATIVE 10/19/2017 0124   LEUKOCYTESUR NEGATIVE 10/19/2017 0124   LEUKOCYTESUR Negative 11/18/2013 0443   Sepsis Labs: @LABRCNTIP (procalcitonin:4,lacticidven:4) )No results found for this or any previous visit (from the past 240 hour(s)).   Radiological Exams on Admission: Dg Chest 2 View  Result Date: 04/15/2018 CLINICAL DATA:  Acute shortness of breath for 2 days. EXAM: CHEST - 2 VIEW COMPARISON:  03/08/2018 FINDINGS: Cardiomegaly and LEFT-sided pacemaker again noted. RIGHT hemidiaphragm elevation is identified. Pulmonary vascular congestion noted with interstitial and  patchy airspace opacities. No pleural effusion or pneumothorax. No acute bony abnormality identified. IMPRESSION: Cardiomegaly with pulmonary vascular congestion with interstitial and patchy airspace opacities. These opacities are favored to represent edema but infection/pneumonia is not excluded. Electronically Signed   By: Margarette Canada M.D.   On: 04/15/2018 17:04    EKG: Independently reviewed.  It shows ventricular paced rhythm with a rate of 60.  Assessment/Plan Principal Problem:   acute on chronic diastolic CHF (congestive heart failure) (HCC) Active Problems:   Hypertension   Pulmonary hypertension (HCC)   Hyperlipidemia   Hypothyroidism   Chronic respiratory failure with hypoxia (HCC)   Atrial fibrillation ([place])     #1 acute on chronic diastolic CHF exacerbation: Patient has failed outpatient diuretic therapy.  We will admit the patient and start her on IV Lasix challenge.  Monitor renal function very closely.  Monitor in and out balance.  Consider cardiology consult if not adequately diuresing.  #2 acute on chronic kidney disease stage III: Patient's baseline creatinine in September was 1.1.  It is currently at 1.85.  We will monitor patient as we diurese her.  #3 hypertension: Continue home regimen.  #4 atrial fibrillation: Currently on Eliquis and rate is controlled.  Patient has a pacemaker in place also.  Continue monitoring  #5 chronic respiratory failure: On home O2.  Continue oxygen by nasal cannula  #6 hypothyroidism: Continue levothyroxine.  #7 morbid obesity: Dietary counseling.   DVT prophylaxis: Eliquis Code Status: Full code Family Communication: Daughter at bedside Disposition Plan: Home Consults called: None Admission status: Inpatient  Severity of Illness: The appropriate patient status for this patient is INPATIENT. Inpatient status is judged to be reasonable and necessary in order to provide the required intensity of service to ensure the patient's  safety. The patient's presenting symptoms, physical exam findings, and initial radiographic and laboratory data in the context of their chronic comorbidities is felt to place them at high risk for further clinical deterioration. Furthermore, it is not anticipated that the patient will be medically stable for discharge from the hospital within 2 midnights of admission. The following factors support the patient status of inpatient.   " The patient's presenting symptoms include shortness of breath and extremity swelling. " The worrisome physical exam findings include 1+ pedal edema. " The initial radiographic and laboratory data are worrisome because of evidence of pulmonary edema on chest x-ray. " The chronic co-morbidities include chronic diastolic CHF.   * I certify that at the point of admission it is my clinical judgment that the patient will require inpatient hospital care spanning beyond 2 midnights from the point of admission due to high intensity of service, high risk for further deterioration and high frequency of surveillance required.Barbette Merino MD [facility] Pager [phone]  If 7PM-7AM, please contact night-coverage [email or web address] Password [facility]  04/15/2018, 8:43 PM

## 2018-04-15 NOTE — Telephone Encounter (Signed)
Debra Saunders with [facility] calling stating she went yesterday to patient and gave her IV lasix and patient has gained 3 pounds today   Pt c/o swelling: STAT is pt has developed SOB within 24 hours  1) How much weight have you gained and in what time span? 3 pounds over night  2) If swelling, where is the swelling located? Left leg more but in both  3) Are you currently taking a fluid pill? Yes   4) Are you currently SOB? Not more than normal   5) Do you have a log of your daily weights (if so, list)?   6) Have you gained 3 pounds in a day or 5 pounds in a week? Over night  7) Have you traveled recently? No

## 2018-04-15 NOTE — Patient Outreach (Signed)
Debra Saunders) Care Management   04/15/2018  Debra Saunders [birth year] [id number]  Debra Saunders is an 72 y.o. female  Subjective: "I'm doing OK but I am still gaining weight"  Objective:   BP 104/60 (BP Location: Left Arm, Patient Position: Sitting, Cuff Size: Large)   Pulse 60   Resp 18   Wt 222 lb 3.2 oz (100.8 kg) Comment: reported morning weight  SpO2 98%   BMI 43.40 kg/m    Physical Exam  Constitutional: She is oriented to person, place, and time. She appears well-developed and well-nourished.  Cardiovascular: Normal rate, regular rhythm and normal heart sounds.  Faint posterior tib pulses bilat  Respiratory: Effort normal and breath sounds normal. No respiratory distress. She has no rales.  GI: Soft. Bowel sounds are normal.  Abdomen rounded  Genitourinary:  Genitourinary Comments: Patient states she has only voided 4 times in 24 hours since IV lasix was given. Attempted to void during visit. "I only did a little"  Musculoskeletal: She exhibits edema.  3+ non-pitting edema to left food, lower leg and thigh  Neurological: She is alert and oriented to person, place, and time.  Skin: Skin is warm and dry. She is not diaphoretic.  Psychiatric: She has a normal mood and affect. Her behavior is normal. Judgment and thought content normal.    Encounter Medications:   Outpatient Encounter Medications as of 04/15/2018  Medication Sig Note  . acetaminophen (TYLENOL) 325 MG tablet Take 325-650 mg by mouth every 6 (six) hours as needed for mild pain. Reported on 10/10/2015   . albuterol (PROVENTIL) (2.5 MG/3ML) 0.083% nebulizer solution Take 3 mLs (2.5 mg total) by nebulization every 6 (six) hours.   [name] amiodarone (PACERONE) 200 MG tablet Take 1 tablet (200 mg total) by mouth daily.   [name] apixaban (ELIQUIS) 5 MG TABS tablet Take 1 tablet (5 mg total) by mouth 2 (two) times daily.   . carvedilol (COREG) 3.125 MG tablet Take 1 tablet (3.125 mg total) by mouth 2  (two) times daily with a meal.   . gabapentin (NEURONTIN) 100 MG capsule TAKE 2 CAPSULES BY MOUTH THREE TIMES DAILY   . 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 losartan (COZAAR) 25 MG tablet Take 1 tablet (25 mg total) by mouth daily.   . Menthol, Topical Analgesic, (BIOFREEZE EX) Apply 1 application topically as needed (for pain).    . metFORMIN (GLUCOPHAGE) 500 MG tablet Take 500 mg by mouth 2 (two) times daily with a meal.    . ondansetron (ZOFRAN) 8 MG tablet Take 1 tablet (8 mg total) by mouth every 8 (eight) hours as needed for nausea or vomiting.   . OXYGEN Inhale 4 L into the lungs continuous.   . potassium chloride SA (K-DUR,KLOR-CON) 20 MEQ tablet Take 1 tablet (20 mEq total) by mouth 2 (two) times daily.   . rosuvastatin (CRESTOR) 10 MG tablet Take 1 tablet (10 mg total) by mouth daily.   . sildenafil (REVATIO) 20 MG tablet Take 20 mg by mouth 3 (three) times daily.   [name] spironolactone (ALDACTONE) 25 MG tablet Take 0.5 tablets (12.5 mg total) by mouth daily.   [name] torsemide (DEMADEX) 20 MG tablet Take 2 tablets (40 mg total) by mouth 2 (two) times daily.   . [name] 4.09 WJ/1.9JY SOPN INJECT 0.'75MG'$  SUBCUTANEOUSLY ONCE A WEEK 01/14/2018: Sunday   No facility-administered encounter medications on file as of 04/15/2018.  Functional Status:   In your present state of health, do you have any difficulty performing the following activities: 03/24/2018 03/09/2018  Hearing? N N  Vision? Y N  Difficulty concentrating or making decisions? N N  Walking or climbing stairs? Y N  Dressing or bathing? Y N  Doing errands, shopping? Y N  Preparing Food and eating ? - -  Using the Toilet? - -  In the past six months, have you accidently leaked urine? - -  Do you have problems with loss of bowel control? - -  Managing your Medications? - -  Managing your Finances? - -  Housekeeping or managing your Housekeeping? - -  Some  recent data might be hidden    Fall/Depression Screening:    Fall Risk  03/24/2018 01/05/2018 11/18/2017  Falls in the past year? No No No  Risk for fall due to : - - -   PHQ 2/9 Scores 03/24/2018 12/31/2017 11/18/2017 09/21/2017 07/01/2017 10/21/2016 10/21/2016  PHQ - 2 Score 1 0 0 1 0 0 0  PHQ- 9 Score - - - - 0 5 -    Assessment: Debra Saunders is alert and oriented x 4. No acute distress. Patient does complain of dizziness upon standing. SBP 104. Assessment as documented above. Daughter present during visit. Patient continues to be followed closely via telephone and weekly visits by [name], EMT with the paramedicine program. Patient has been slowly gaining weight. She took metolozone over the weekend with little increase in urine output and only 2 lb weight loss. Per EMR documentation patients dry weight is 213. Todays weight is 222.2, and increase from 220.8 yesterday AFTER 53m of IV lasix administered by EMT. Patient has only voided 4 times in 24 hours. She continues to take medication as prescribed. EMT fills pillbox weekly.  RN CM spoke with EMT. She has been collaborating all morning with cardiology providers related to patient's weight gain and decreased urine output. She has received an order for patient to be admitted to the ED for renal function monitoring and diuresing. RN CM [facility] with patient and EMT [facility]. Patient has appointment on Nov 8 with Dr. BHaroldine Laws [facility] Patient agreeable and EMT will coordinate.  Patient continues to monitor CBG, BP, and weight daily and record. She continues to report compliance with medications, low sodium diet, and oxygen therapy.  Transportation has been a barrier to patient's medical care. She often missed appointments related to her family not being able to provide transportation. RN CM discussed [employer] transportation with  patient and she utilized x 1. EMT reports that patient discussed with her that she did not like the [facility] and now patient utilizes for all medical transportation for 3.00 each way.  Plan: RN CM will close discipline as patient is receiving care coordination and community case management through paramedicine program.   RN [facility] intent to close dicipline and request for EMT KNile Dearto be notified upon discharge for gap closure of care.  [facility] Problem [facility]     Most Recent Value  Care Plan Problem One  HF-High risk for admission  Role Documenting the Problem One  Care Management CMasuryfor Problem One  Active  [facility] Long Term Goal   Patient will not have hospital  readmission for HF within the next 31 days  [facility] Long Term Goal Start Date  03/15/18 [date reset to reflect post hospital discharge]  [facility] Long Term Goal Met Date  04/15/18  [facility] CM Short Term Goal #1   Over the next 30 days patient will take medications as prescribed as evidenced by patient reporting and review of EMR  [facility] CM Short Term Goal #1 Start Date  03/15/18 [date reset to reflect hospital discharge follow up]  [facility] CM Short Term Goal #1 Met Date  04/15/18  [facility] CM Short Term Goal #2   Over the next 30 days patient will attend all follow up MD visits as scheduled as evidenced by patient reporting and review of EMR  [facility] CM Short Term Goal #2 Start Date  03/15/18 [date reset to reflect post hospital discharge encounter]  [facility] CM Short Term Goal #2 Met Date  04/15/18     Duan Scharnhorst E. [name] RN, BSN [facility] Care Management Coordinator [phone]

## 2018-04-15 NOTE — ED Triage Notes (Addendum)
Per EMS pt come to [place] due to problems with SOB and left sided Edema, arm stomach LLE.  Pt was given a dose of Lasix yesterday and her family doctor recently changed her lasix dose, but he recommened that she come here so she could be evaluated for the increased edema and SOB. Pt also states a decrease in urine output.  Pt is Currently AOx4 NAD noted and on her baseline 4L of O2.

## 2018-04-15 NOTE — ED Provider Notes (Signed)
[place] [facility] EMERGENCY DEPARTMENT Provider Note   CSN: [id number] Arrival date & time: 04/15/18  1555     History   Chief Complaint Chief Complaint  Patient presents with  . Leg Swelling  . Shortness of Breath    HPI Debra Saunders is a 72 y.o. female.  Debra Saunders is a 72 y.o. Female with a history of CHF, A. fib currently anticoagulated with Eliquis, cardiorenal syndrome, COPD, hypertension, hyperlipidemia, diabetes and stroke, who presents to the emergency department for evaluation of shortness of breath and left eyelid edema.  Patient reports edema started last Thursday and she saw her cardiologist who increased her diuretics, but she reports that she did not have much urinary output despite this, she was seen by the home health nurse yesterday and given a dose of IV Lasix but still did not have a significant urinary response.  The home health nurse contacted her doctor today and he recommended she come to the hospital for evaluation due to persistent edema despite multiple outpatient interventions.  She also had a blood pressure of 110/40 at home today which is significantly lower than her norm.  She reports she has had such shortness of breath primarily with exertion, she does wear 4 L nasal cannula all the time at baseline and has not used increased amounts of oxygen recently.  She reports that when she has fluid overload this always accumulates on her left side and this is not out of the norm for her.  She has not had any chest pain.  Denies any syncope or lightheadedness.  She has had some mild abdominal distention from fluid and some mild tenderness but no localized abdominal pain, no nausea, vomiting or diarrhea.  No fevers or chills, no cough.  Denies dysuria or urinary frequency and reports a decrease in her urinary output.  Chronic history of CHF with an EF of  55-60%. She is followed by Dr. Fletcher Anon with Cardiology, and NP Murray Hodgkins is been following  her for fluid overload over the past week, and recommended she come today for evaluation else she would likely require admission for more consistent IV diuresis given that this is been refractory to outpatient therapy over the past week.  She continues to be 10 pounds up from her baseline.     Past Medical History:  Diagnosis Date  . Arthritis   . Atrial fibrillation, persistent    a. s/p TEE-guided DCCV on 08/20/2017  . Cardiorenal syndrome    a. 09/2017 Creat rose to 2.7 w/ diuresis-->HD x 2.  . Chest pain    a. 01/2012 Cath: nonobs dzs;  c. 04/2014 MV: No ischemia.  . Chronic back pain   . Chronic combined systolic (congestive) and diastolic (congestive) heart failure ([place])    a. Dx 2005 @ [facility];  b. 02/2014 Echo: Ef 55-60%, no rwma, mild MR, PASP 30mmHg. c. 07/2017: EF 30-35%, diffuse HK, trivial AI, severe MR, moderate TR; d. 09/2017 Echo: EF 40-45%, ant, antsep, ap, basal HK; e. 02/2018 Echo: EF 55-60%, Gr1 DD. Ao sclerosis w/o stenosis. Mild to mod MR. Mildly dil LA. Mildly reduced RV fxn. Mild TR. PASP 35-49mmHg.  [name] Chronic respiratory failure (HCC)    a. on home O2  . COPD (chronic obstructive pulmonary disease) ([place])    a. Home O2 - 3lpm  . Gastritis   . History of intracranial hemorrhage    a. 6/14 described by neurosurgery as "small frontal posttraumatic SAH " - pt was  on xarelto @ the time.  . Hyperlipidemia   . Hypertension   . Lipoma of back   . Lymphedema    a. Chronic LLE edema.  [name] NICM (nonischemic cardiomyopathy) ([place])    a. 09/2017 Echo: EF 40-45%; b. 09/2017 Cath: minor, insignificant CAD; c. 02/2018 Echo: EF 55-60%, Gr1 DD.  [name] Obesity   . PAH (pulmonary artery hypertension) ([place])    a. 09/2017 Echo: PASP 23mmHg; b. 09/2017 RHC: RA 23, RV 84/13, PA 84/29, PCWP 20, CO 3.89, CI 1.89. LVEDP 18.  . Sepsis with metabolic encephalopathy ([place])   . Sleep apnea   . Streptococcal bacteremia   . Stroke ([place])   . Symptomatic bradycardia    a. s/p BSX PPM.  . Thyroid nodule   .  Type II diabetes mellitus ([place])   . Urinary incontinence     Patient Active Problem List   Diagnosis Date Noted  . Acute renal failure ([place])   . Shortness of breath   . Atrial fibrillation ([place]) 09/22/2017  . Acute bilateral low back pain without sciatica 01/28/2017  . Pain in limb 07/22/2016  . Neuropathy 06/09/2016  . Pain in the chest 03/10/2016  . Chronic deep vein thrombosis (DVT) (HCC) 03/03/2016  . Chronic back pain 10/19/2015  . Goiter 04/25/2015  . Cervical disc disorder with radiculopathy of cervical region 03/30/2015  . Elevated LFTs 06/29/2014  . 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 Abnormality of gait 01/13/2014  . Sinoatrial node dysfunction ([place]) 01/09/2014  . Allergic rhinitis 10/26/2013  . Lymphedema 08/03/2013  . Constipation 04/12/2013  . Pulmonary embolus ([place]) 01/19/2013  . Subarachnoid hemorrhage ([place]) 01/19/2013  . Musculoskeletal chest pain 01/19/2013  . Preop cardiovascular exam 10/19/2012  . Anemia 04/28/2012  . Chronic respiratory failure with hypoxia ([place]) 04/26/2012  . Diabetes ([place]) 03/30/2012  . Pulmonary hypertension ([place]) 03/30/2012  . Hyperlipidemia 03/30/2012  . Screening for breast cancer 03/30/2012  . Hypothyroidism 03/30/2012  . Chronic systolic heart failure ([place]) 01/22/2012  . Hypertension     Past Surgical History:  Procedure Laterality Date  . ABDOMINAL HYSTERECTOMY  1969  . CARDIAC CATHETERIZATION  2013   @ [place]: No obstructive CAD: Only 20% ostial left circumflex.  [name] CARDIOVERSION N/A 10/12/2017   Procedure: CARDIOVERSION;  Surgeon: Minna Merritts, MD;  Location: [facility] ORS;  Service: Cardiovascular;  Laterality: N/A;  . CHOLECYSTECTOMY    . CYST REMOVAL TRUNK  2002   BACK  . DIALYSIS/PERMA CATHETER INSERTION N/A 10/06/2017   Procedure: DIALYSIS/PERMA CATHETER INSERTION;  Surgeon: Katha Cabal, MD;  Location: [place] CV LAB;  Service: Cardiovascular;  Laterality: N/A;  . INSERT / REPLACE / REMOVE  PACEMAKER    . lipoma removal  2014   back  . PACEMAKER INSERTION  [address] Scientific dual chamber pacemaker implanted by Dr Caryl Comes for symptomatic bradycardia  . PERMANENT PACEMAKER INSERTION N/A 01/09/2014   Procedure: PERMANENT PACEMAKER INSERTION;  Surgeon: Deboraha Sprang, MD;  Location: [facility] CATH LAB;  Service: Cardiovascular;  Laterality: N/A;  . RIGHT/LEFT HEART CATH AND CORONARY ANGIOGRAPHY N/A 10/19/2017   Procedure: RIGHT/LEFT HEART CATH AND CORONARY ANGIOGRAPHY;  Surgeon: Wellington Hampshire, MD;  Location: [place] CV LAB;  Service: Cardiovascular;  Laterality: N/A;  . TEE WITHOUT CARDIOVERSION N/A 08/20/2017   Procedure: TRANSESOPHAGEAL ECHOCARDIOGRAM (TEE) & Direct current cardioversion;  Surgeon: Minna Merritts, MD;  Location: [facility] ORS;  Service: Cardiovascular;  Laterality: N/A;  . TRIGGER FINGER RELEASE    .  VAGINAL HYSTERECTOMY       OB History    Gravida  4   Para      Term      Preterm      AB      Living  4     SAB      TAB      Ectopic      Multiple      Live Births           Obstetric Comments  Age first pregnancy 21         Home Medications    Prior to Admission medications   Medication Sig Start Date End Date Taking? Authorizing Provider  acetaminophen (TYLENOL) 325 MG tablet Take 325-650 mg by mouth every 6 (six) hours as needed for mild pain. Reported on 10/10/2015    [provider]  albuterol (PROVENTIL) (2.5 MG/3ML) 0.083% nebulizer solution Take 3 mLs (2.5 mg total) by nebulization every 6 (six) hours. 08/28/17   Loletha Grayer, MD  amiodarone (PACERONE) 200 MG tablet Take 1 tablet (200 mg total) by mouth daily. 01/15/18   Theora Gianotti, NP  apixaban (ELIQUIS) 5 MG TABS tablet Take 1 tablet (5 mg total) by mouth 2 (two) times daily. 03/12/18   Loletha Grayer, MD  carvedilol (COREG) 3.125 MG tablet Take 1 tablet (3.125 mg total) by mouth 2 (two) times daily with a meal. 02/23/18   Theora Gianotti,  NP  gabapentin (NEURONTIN) 100 MG capsule TAKE 2 CAPSULES BY MOUTH THREE TIMES DAILY 08/12/17   [name], MD  glucose blood (TRUE METRIX BLOOD GLUCOSE TEST) test strip Check blood glucose twice daily Dx:E11.8, Z79.4 09/17/17   [name], MD  losartan (COZAAR) 25 MG tablet Take 1 tablet (25 mg total) by mouth daily. 01/15/18   Theora Gianotti, NP  Menthol, Topical Analgesic, (BIOFREEZE EX) Apply 1 application topically as needed (for pain).     [provider]  metFORMIN (GLUCOPHAGE) 500 MG tablet Take 500 mg by mouth 2 (two) times daily with a meal.     [provider]  ondansetron (ZOFRAN) 8 MG tablet Take 1 tablet (8 mg total) by mouth every 8 (eight) hours as needed for nausea or vomiting. 09/25/14   Jackolyn Confer, MD  OXYGEN Inhale 4 L into the lungs continuous.    [provider]  potassium chloride SA (K-DUR,KLOR-CON) 20 MEQ tablet Take 1 tablet (20 mEq total) by mouth 2 (two) times daily. 12/04/17   Theora Gianotti, NP  rosuvastatin (CRESTOR) 10 MG tablet Take 1 tablet (10 mg total) by mouth daily. 11/30/17   [name], MD  sildenafil (REVATIO) 20 MG tablet Take 20 mg by mouth 3 (three) times daily. 05/20/17   [provider]  spironolactone (ALDACTONE) 25 MG tablet Take 0.5 tablets (12.5 mg total) by mouth daily. 01/07/18 04/15/18  Theora Gianotti, NP  torsemide (DEMADEX) 20 MG tablet Take 2 tablets (40 mg total) by mouth 2 (two) times daily. 04/13/18 07/12/18  Theora Gianotti, NP  [name] 7.41 UL/8.4TX SOPN INJECT 0.75MG  SUBCUTANEOUSLY ONCE A WEEK 12/22/17   [name], MD    Family History Family History  Problem Relation Age of Onset  . Heart disease Mother   . Hypertension Mother   . COPD Mother        was a smoker  . Thyroid disease Mother   . Hypertension Father   . Hypertension Sister   .  Thyroid disease Sister   . Hypertension Sister   . Thyroid disease Sister   .  Breast cancer Maternal Aunt   . Kidney disease Neg Hx   . Bladder Cancer Neg Hx     Social History Social History   Tobacco Use  . Smoking status: Former Smoker    Packs/day: 0.30    Years: 2.00    Pack years: 0.60    Types: Cigarettes    Last attempt to quit: 06/23/1990    Years since quitting: 27.8  . Smokeless tobacco: Never Used  Substance Use Topics  . Alcohol use: No  . Drug use: No     Allergies   Aspirin; Other; and Penicillins   Review of Systems Review of Systems  Constitutional: Positive for fatigue. Negative for chills and fever.  HENT: Negative for congestion, rhinorrhea and sore throat.   Eyes: Negative for visual disturbance.  Respiratory: Positive for shortness of breath. Negative for cough, chest tightness and wheezing.   Cardiovascular: Positive for leg swelling. Negative for chest pain and palpitations.  Gastrointestinal: Negative for abdominal pain, nausea and vomiting.  Genitourinary: Negative for dysuria and frequency.  Musculoskeletal: Negative for arthralgias and myalgias.  Skin: Negative for color change and rash.  Neurological: Negative for dizziness, weakness, light-headedness, numbness and headaches.     Physical Exam Updated Vital Signs BP 128/70 (BP Location: Right Arm)   Pulse 60   Temp 98.3 F (36.8 C) (Oral)   Resp 10   Ht 5' (1.524 m)   Wt 100.8 kg   SpO2 100%   BMI 43.38 kg/m   Physical Exam  Constitutional: She is oriented to person, place, and time. She appears well-developed and well-nourished. She does not appear ill. No distress.  HENT:  Head: Normocephalic and atraumatic.  Mouth/Throat: Oropharynx is clear and moist.  Eyes: Pupils are equal, round, and reactive to light. EOM are normal. Right eye exhibits no discharge. Left eye exhibits no discharge.  Neck: Neck supple. No tracheal deviation present.  Cardiovascular: Normal rate, regular rhythm, normal heart sounds and intact distal pulses. Exam reveals no gallop and  no friction rub.  No murmur heard. Pulmonary/Chest: Effort normal. No respiratory distress. She has no wheezes. She has rales.  Patient on 4 L nasal cannula which is her chronic oxygen requirement, respirations equal and unlabored, and able to speak in full sentences, patient has some faint crackles in bilateral lung bases but lung fields otherwise clear to auscultation throughout.  Abdominal: Soft. Bowel sounds are normal. She exhibits no distension and no mass. There is no tenderness. There is no guarding.  Musculoskeletal: She exhibits no edema or deformity.  Neurological: She is alert and oriented to person, place, and time. Coordination normal.  Speech is clear, able to follow commands CN III-XII intact Normal strength in upper and lower extremities bilaterally including dorsiflexion and plantar flexion, strong and equal grip strength Sensation normal to light and sharp touch Moves extremities without ataxia, coordination intact  Skin: Skin is warm and dry. Capillary refill takes less than 2 seconds. She is not diaphoretic.  Nursing note and vitals reviewed.    ED Treatments / Results  Labs (all labs ordered are listed, but only abnormal results are displayed) Labs Reviewed  CBC - Abnormal; Notable for the following components:      Result Value   Hemoglobin 10.6 (*)    MCH 25.9 (*)    MCHC 29.0 (*)    All other components within normal limits  COMPREHENSIVE METABOLIC PANEL - Abnormal; Notable for the following components:   BUN 47 (*)    Creatinine, Ser 1.84 (*)    GFR calc non Af Amer 26 (*)    GFR calc Af Amer 30 (*)    All other components within normal limits  BRAIN NATRIURETIC PEPTIDE  I-STAT TROPONIN, ED    EKG EKG Interpretation  Date/Time:  Thursday April 15 2018 17:53:19 EDT Ventricular Rate:  60 PR Interval:    QRS Duration: 180 QT Interval:  539 QTC Calculation: 539 R Axis:   -83 Text Interpretation:  Ventricular-paced rhythm Confirmed by Isla Pence [phone]) on 04/15/2018 6:00:22 PM   Radiology Dg Chest 2 View  Result Date: 04/15/2018 CLINICAL DATA:  Acute shortness of breath for 2 days. EXAM: CHEST - 2 VIEW COMPARISON:  03/08/2018 FINDINGS: Cardiomegaly and LEFT-sided pacemaker again noted. RIGHT hemidiaphragm elevation is identified. Pulmonary vascular congestion noted with interstitial and patchy airspace opacities. No pleural effusion or pneumothorax. No acute bony abnormality identified. IMPRESSION: Cardiomegaly with pulmonary vascular congestion with interstitial and patchy airspace opacities. These opacities are favored to represent edema but infection/pneumonia is not excluded. Electronically Signed   By: Margarette Canada M.D.   On: 04/15/2018 17:04    Procedures Procedures (including critical care time)  Medications Ordered in ED Medications  acetaminophen (TYLENOL) tablet 650 mg (650 mg Oral Given 04/15/18 1707)     Initial Impression / Assessment and Plan / ED Course  I have reviewed the triage vital signs and the nursing notes.  Pertinent labs & imaging results that were available during my care of the patient were reviewed by me and considered in my medical decision making (see chart for details).  Patient presents to the emergency department for evaluation of persistent shortness of breath and edema despite multiple outpatient efforts to reduce fluid overload.  Known history of CHF and A. fib.  Chronic oxygen requirement of 4 L, but worsening shortness of breath with exertion.  Edema started last week and her home diuretics were increased, patient continued to gain weight, yesterday was given 40 of IV Lasix with home health, but still gained 3 additional pounds of fluid today.  Home health nurse contacted her cardiologist today who recommended she present to the emergency department for further evaluation and likely admission for consistent IV diuresis.  Patient is followed by Dr. Hyman Bible with [facility] in  [place].  Vitals are stable and she appears to be in no acute distress no additional oxygen requirement she does have some mild crackles in the lower lung fields and significant edema in the left lower extremity.  Will get basic labs, troponin, BNP, EKG and chest x-ray.  No leukocytosis and hemoglobin is at baseline.  Creatinine is 1.84, bumped from her baseline of around 1.0, no other acute electrolyte derangements, normal liver function.  Negative troponin and EKG without concerning ischemic changes, ventricularly paced rhythm.  BNP is not significantly elevated at 35.8 but patient is clearly volume overloaded on exam.  Chest x-ray shows cardiomegaly with opacities which are favored to represent edema but pneumonia cannot be ruled out, symptoms clinically suggestive of pneumonia, no fevers or chills, no cough.   Patient's lab evaluation appears stable she has been refractory to treatment with diuresis at home but does have history of cardiorenal syndrome and has required dialysis in the past due to severe fluid overload.  Patient's cardiologist had advised admission for more consistent IV diuresis will consult hospitalist for admission.  Spoke  with Dr. Jonelle Sidle who will see and admit the patient, request cardiology consult.  Spoke with Dr. Harl Bowie with cardiology who will see the patient in consult this evening.  Final Clinical Impressions(s) / ED Diagnoses   Final diagnoses:  Acute on chronic combined systolic and diastolic congestive heart failure [facility])  Peripheral edema    ED Discharge Orders    None       Janet Berlin 04/15/18 Ladene Artist, MD 04/15/18 [phone]

## 2018-04-15 NOTE — Telephone Encounter (Signed)
-----   Message from Theora Gianotti, NP sent at 04/15/2018  7:41 AM EDT ----- Kidney's look a little dry after taking two days of metolazone over the weekend.  She received IV lasix yesterday.  Please inquire as to how she is responding to the dose of lasix and plan for f/u bmet next week.

## 2018-04-16 ENCOUNTER — Inpatient Hospital Stay (HOSPITAL_COMMUNITY): Payer: Medicare HMO

## 2018-04-16 ENCOUNTER — Inpatient Hospital Stay: Payer: Self-pay

## 2018-04-16 DIAGNOSIS — I5033 Acute on chronic diastolic (congestive) heart failure: Secondary | ICD-10-CM

## 2018-04-16 DIAGNOSIS — I5043 Acute on chronic combined systolic (congestive) and diastolic (congestive) heart failure: Secondary | ICD-10-CM

## 2018-04-16 LAB — COMPREHENSIVE METABOLIC PANEL
ALT: 16 U/L (ref 0–44)
AST: 19 U/L (ref 15–41)
Albumin: 3.3 g/dL — ABNORMAL LOW (ref 3.5–5.0)
Alkaline Phosphatase: 94 U/L (ref 38–126)
Anion gap: 11 (ref 5–15)
BUN: 45 mg/dL — ABNORMAL HIGH (ref 8–23)
CO2: 32 mmol/L (ref 22–32)
Calcium: 9.4 mg/dL (ref 8.9–10.3)
Chloride: 99 mmol/L (ref 98–111)
Creatinine, Ser: 1.75 mg/dL — ABNORMAL HIGH (ref 0.44–1.00)
GFR calc Af Amer: 32 mL/min — ABNORMAL LOW (ref >60)
GFR calc non Af Amer: 28 mL/min — ABNORMAL LOW (ref >60)
Glucose, Bld: 108 mg/dL — ABNORMAL HIGH (ref 70–99)
Potassium: 3.6 mmol/L (ref 3.5–5.1)
Sodium: 142 mmol/L (ref 135–145)
Total Bilirubin: 0.7 mg/dL (ref 0.3–1.2)
Total Protein: 6.5 g/dL (ref 6.5–8.1)

## 2018-04-16 LAB — ECHOCARDIOGRAM COMPLETE
Height: 60 in
Weight: 3491.2 oz

## 2018-04-16 LAB — HEMOGLOBIN A1C
Hgb A1c MFr Bld: 5.8 % — ABNORMAL HIGH (ref 4.8–5.6)
Mean Plasma Glucose: 119.76 mg/dL

## 2018-04-16 LAB — CBC WITH DIFFERENTIAL/PLATELET
Abs Immature Granulocytes: 0.02 10*3/uL (ref 0.00–0.07)
Basophils Absolute: 0.1 10*3/uL (ref 0.0–0.1)
Basophils Relative: 1 %
Eosinophils Absolute: 0.2 10*3/uL (ref 0.0–0.5)
Eosinophils Relative: 3 %
HCT: 33.1 % — ABNORMAL LOW (ref 36.0–46.0)
Hemoglobin: 9.5 g/dL — ABNORMAL LOW (ref 12.0–15.0)
Immature Granulocytes: 0 %
Lymphocytes Relative: 32 %
Lymphs Abs: 1.9 10*3/uL (ref 0.7–4.0)
MCH: 25.6 pg — ABNORMAL LOW (ref 26.0–34.0)
MCHC: 28.7 g/dL — ABNORMAL LOW (ref 30.0–36.0)
MCV: 89.2 fL (ref 80.0–100.0)
Monocytes Absolute: 0.5 10*3/uL (ref 0.1–1.0)
Monocytes Relative: 8 %
Neutro Abs: 3.3 10*3/uL (ref 1.7–7.7)
Neutrophils Relative %: 56 %
Platelets: 210 10*3/uL (ref 150–400)
RBC: 3.71 MIL/uL — ABNORMAL LOW (ref 3.87–5.11)
RDW: 13.2 % (ref 11.5–15.5)
WBC: 6 10*3/uL (ref 4.0–10.5)
nRBC: 0 % (ref 0.0–0.2)

## 2018-04-16 LAB — GLUCOSE, CAPILLARY
Glucose-Capillary: 99 mg/dL (ref 70–99)
Glucose-Capillary: 123 mg/dL — ABNORMAL HIGH (ref 70–99)
Glucose-Capillary: 133 mg/dL — ABNORMAL HIGH (ref 70–99)
Glucose-Capillary: 149 mg/dL — ABNORMAL HIGH (ref 70–99)

## 2018-04-16 LAB — MRSA PCR SCREENING: MRSA by PCR: NEGATIVE

## 2018-04-16 MED ORDER — FUROSEMIDE 10 MG/ML IJ SOLN
80.0000 mg | Freq: Two times a day (BID) | INTRAMUSCULAR | Status: DC
  Administered 2018-04-16 – 2018-04-19 (×7): 80 mg via INTRAVENOUS
  Filled 2018-04-16 (×7): qty 8

## 2018-04-16 MED ORDER — SODIUM CHLORIDE 0.9% FLUSH
10.0000 mL | Freq: Two times a day (BID) | INTRAVENOUS | Status: DC
  Administered 2018-04-16 – 2018-04-20 (×6): 10 mL

## 2018-04-16 MED ORDER — INSULIN ASPART 100 UNIT/ML ~~LOC~~ SOLN: 0.0000 [IU] | Freq: Every day | SUBCUTANEOUS | Status: DC

## 2018-04-16 MED ORDER — INSULIN ASPART 100 UNIT/ML ~~LOC~~ SOLN: 0.0000 [IU] | Freq: Three times a day (TID) | SUBCUTANEOUS | Status: DC

## 2018-04-16 MED ORDER — INSULIN ASPART 100 UNIT/ML ~~LOC~~ SOLN
0.0000 [IU] | Freq: Three times a day (TID) | SUBCUTANEOUS | Status: DC
  Administered 2018-04-18 – 2018-04-19 (×2): 2 [IU] via SUBCUTANEOUS
  Administered 2018-04-20: 1 [IU] via SUBCUTANEOUS

## 2018-04-16 MED ORDER — SODIUM CHLORIDE 0.9% FLUSH: 10.0000 mL | INTRAVENOUS | Status: DC | PRN

## 2018-04-16 MED ORDER — MILRINONE LACTATE IN DEXTROSE 20-5 MG/100ML-% IV SOLN
0.1250 ug/kg/min | INTRAVENOUS | Status: DC
  Administered 2018-04-16 – 2018-04-18 (×3): 0.125 ug/kg/min via INTRAVENOUS
  Filled 2018-04-16 (×3): qty 100

## 2018-04-16 NOTE — Care Management Note (Signed)
Case Management Note  Patient Details  Name: Debra Saunders MRN: [id number] Date of Birth: 1945-12-25  Subjective/Objective:  CHF                 Action/Plan: Patient lives at home; PCP: [name], MD; has private insurance with [facility] Medicare with prescription drug coverage; is active with [facility] for HHRN/ PT; has home oxygen through [place]; CM following for progression of care.  Expected Discharge Date:     Possibly 04/20/2018             Expected Discharge Plan:  [place]  In-House Referral:   [facility]  Discharge planning Services  CM Consult  HH Arranged:  RN, Disease Management, PT [place] Agency:  [facility]  Status of Service:  In process, will continue to follow  Sherrilyn Rist [phone] 04/16/2018, 10:32 AM

## 2018-04-16 NOTE — Progress Notes (Signed)
Peripherally Inserted Central Catheter/Midline Placement  The IV Nurse has discussed with the patient and/or persons authorized to consent for the patient, the purpose of this procedure and the potential benefits and risks involved with this procedure.  The benefits include less needle sticks, lab draws from the catheter, and the patient may be discharged home with the catheter. Risks include, but not limited to, infection, bleeding, blood clot (thrombus formation), and puncture of an artery; nerve damage and irregular heartbeat and possibility to perform a PICC exchange if needed/ordered by physician.  Alternatives to this procedure were also discussed.  B[employer] Power PICC patient education guide, fact sheet on infection prevention and patient information card has been provided to patient /or left at bedside.    PICC/Midline Placement Documentation  PICC Double Lumen 04/16/18 PICC Right Brachial 40 cm 0 cm (Active)  Indication for Insertion or Continuance of Line Vasoactive infusions 04/16/2018  6:54 PM  Exposed Catheter (cm) 0 cm 04/16/2018  6:54 PM  Site Assessment Clean;Dry;Intact 04/16/2018  6:54 PM  Lumen #1 Status Flushed;Blood return noted 04/16/2018  6:54 PM  Lumen #2 Status Flushed;Blood return noted 04/16/2018  6:54 PM  Dressing Type Transparent 04/16/2018  6:54 PM  Dressing Status Clean;Dry;Intact;Antimicrobial disc in place 04/16/2018  6:54 PM  Dressing Intervention New dressing 04/16/2018  6:54 PM  Dressing Change Due 04/23/18 04/16/2018  6:54 PM       Christella Noa Albarece 04/16/2018, 6:56 PM

## 2018-04-16 NOTE — Evaluation (Signed)
Physical Therapy Evaluation Patient Details Name: Debra Saunders MRN: [id number] DOB: 29-Nov-1945 Today's Date: 04/16/2018   History of Present Illness   72 y.o. female with a history of chronic diastolic heart failure, nonobstructive CAD, afib on eliquis, bradycardia s/p PPM Corporate investment banker), chronic respiratory failure on home O2, COPD, PAH, DM2, HTN, CKD (required HD in past), DVT s/p IVC filter 2004, HL, traumatic subarachnoic hemorrhage while on xarelto 2014, CVA 2014, OSA, and chronic lymphedema and admitted with acute on chronic diastolic heart failure  Clinical Impression  Pt admitted with above diagnosis. Pt currently with functional limitations due to the deficits listed below (see PT Problem List).  Pt will benefit from skilled PT to increase their independence and safety with mobility to allow discharge to the venue listed below.  Pt agreeable to ambulate as she wishes to return home upon d/c.  Pt reports DOE ongoing and she takes seated rest breaks with her 4WW at home at baseline.  Pt reports she is currently working with HHPT and anticipates return to this upon d/c.  Pt required some rest breaks with ambulation however SpO2 remained 98% on 4L O2 [place].     Follow Up Recommendations Home health PT(resume HHPT)    Equipment Recommendations  None recommended by PT    Recommendations for Other Services       Precautions / Restrictions Precautions Precaution Comments: monitor dyspnea, chronic 4L O2 [place]      Mobility  Bed Mobility Overal bed mobility: Modified Independent             General bed mobility comments: increased time, no physical assist required  Transfers Overall transfer level: Needs assistance Equipment used: 4-wheeled walker Transfers: Sit to/from Stand Sit to Stand: Min guard         General transfer comment: min/guard for safety, cues for brakes initially  Ambulation/Gait Ambulation/Gait assistance: Min guard Gait Distance (Feet): 140  Feet Assistive device: 4-wheeled walker Gait Pattern/deviations: Step-through pattern;Decreased stride length     General Gait Details: slow but steady pace with 4WW, remained on 4L O2 [place] and SPO2 98% (pt reports SpO2 is usually good even though she feels SOB), dysnpea 3/4, one seated rest break and one standing rest break required  Stairs            Wheelchair Mobility    Modified Rankin (Stroke Patients Only)       Balance Overall balance assessment: Needs assistance         Standing balance support: Bilateral upper extremity supported Standing balance-Leahy Scale: Poor Standing balance comment: currently requiring UE support, pt reports HHPT was working with her on balance without assistive device                             Pertinent Vitals/Pain Pain Assessment: No/denies pain    Home Living Family/patient expects to be discharged to:: Private residence Living Arrangements: Alone Available Help at Discharge: Family;Friend(s);Available PRN/intermittently Type of Home: House Home Access: Ramped entrance     Home Layout: One level Home Equipment: Walker - 2 wheels;Walker - 4 wheels      Prior Function Level of Independence: Independent with assistive device(s)         Comments: uses 4WW, seated rest breaks as needed, chronic 4L O2     Hand Dominance        Extremity/Trunk Assessment        Lower Extremity Assessment Lower Extremity Assessment: Generalized  weakness       Communication   Communication: No difficulties  Cognition Arousal/Alertness: Awake/alert Behavior During Therapy: WFL for tasks assessed/performed Overall Cognitive Status: Within Functional Limits for tasks assessed                                        General Comments      Exercises     Assessment/Plan    PT Assessment Patient needs continued PT services  PT Problem List Decreased strength;Decreased mobility;Decreased  balance;Decreased activity tolerance;Cardiopulmonary status limiting activity       PT Treatment Interventions Gait training;Therapeutic exercise;Patient/family education;DME instruction;Therapeutic activities;Functional mobility training;Balance training    PT Goals (Current goals can be found in the Care Plan section)  Acute Rehab PT Goals PT Goal Formulation: With patient Time For Goal Achievement: 04/30/18 Potential to Achieve Goals: Good    Frequency Min 3X/week   Barriers to discharge        Co-evaluation               AM-PAC PT "6 Clicks" Daily Activity  Outcome Measure Difficulty turning over in bed (including adjusting bedclothes, sheets and blankets)?: None Difficulty moving from lying on back to sitting on the side of the bed? : A Little Difficulty sitting down on and standing up from a chair with arms (e.g., wheelchair, bedside commode, etc,.)?: A Lot Help needed moving to and from a bed to chair (including a wheelchair)?: A Little Help needed walking in hospital room?: A Little Help needed climbing 3-5 steps with a railing? : A Lot 6 Click Score: 17    End of Session Equipment Utilized During Treatment: Gait belt;Oxygen Activity Tolerance: Patient tolerated treatment well Patient left: in chair;with call bell/phone within reach(pt aware to call for assist)   PT Visit Diagnosis: Difficulty in walking, not elsewhere classified (R26.2)    Time: 1440-1500 PT Time Calculation (min) (ACUTE ONLY): 20 min   Charges:   PT Evaluation $PT Eval Low Complexity: [place], PT, DPT Acute Rehabilitation Services Office: [phone] Pager: [phone]  Trena Platt 04/16/2018, 3:34 PM

## 2018-04-16 NOTE — Progress Notes (Signed)
Pt transferred to the [facility]

## 2018-04-16 NOTE — Progress Notes (Signed)
PROGRESS NOTE    Debra Saunders  FXT:[id number] DOB: [birth year] DOA: 04/15/2018 PCP: [name], MD   Brief Narrative: Debra Saunders is Debra Saunders 72 y.o. female with medical history significant of diastolic dysfunction CHF, atrial fibrillation on chronic Eliquis, history of cardiorenal syndrome requiring hemodialysis in the past, COPD, hypertension, morbid obesity, diabetes, hyperlipidemia and previous CVA who came to the ER complaining of shortness of breath and lower extremity edema.  Patient has been dealing with excess fluid with worsening edema just last week.  She was seen by her cardiologist who increased her oral diuretic dose Luanne Krzyzanowski week ago.  Patient did not get adequate urinary response and did not have any decrease in her fluid status.  She has gained significant weight since then.  She is on home O2 at 4 L and has been requiring even more.  She called her cardiologist today who recommended hospital admissions with IV diuretics.  Patient's creatinine has slightly worsened over her baseline.  With her history of previous cardiorenal syndrome and failing to respond to oral diuretics she is being admitted for further work-up..  Assessment & Plan:   Principal Problem:   acute on chronic diastolic CHF (congestive heart failure) (HCC) Active Problems:   Hypertension   Pulmonary hypertension (HCC)   Hyperlipidemia   Hypothyroidism   Chronic respiratory failure with hypoxia (HCC)   Atrial fibrillation (HCC)   HFpEF Exacerbation  Pulmonary Hypertension: Echo from 02/2018 with normal EF, grade 1 diastolic dysfunction.  Increased PASP.  Patient has failed outpatient diuretic therapy.   Cardiology c/s appreciate recs -> placing PICC, following CVP, starting milrinone and lasix 80 BID.   Spiro 12.5 mg daily.  Holding arb and betablocker. Continue sildenafil for [facility] Cards recommending high res CT given long term amiodarone, also planning for V/Q scan.  acute on chronic kidney disease stage  III: Patient's baseline creatinine in September was 1.1.  1.75 today (peaked to 1.84 during this admission). Follow with diuresis  hypertension: holding arb and bb, continue spironolactone and   atrial fibrillation  S/p pacemaker: continue eliquis and amiodarone  chronic respiratory failure: On home O2, baseline 4 L [place].  Continue oxygen by nasal cannula  hypothyroidism: Continue levothyroxine.  morbid obesity: Dietary counseling.  Hx DVT s/p IVC filter  Hx CVA  DVT prophylaxis: eliquis Code Status: full  Family Communication: none at bedside Disposition Plan: pending improvement   Consultants:   Cardiology  pulm   Procedures:  Echo Study Conclusions  - Left ventricle: The cavity size was normal. Wall thickness was   increased in Debra Saunders pattern of moderate LVH. Systolic function was   normal. The estimated ejection fraction was in the range of 60%   to 65%. Wall motion was normal; there were no regional wall   motion abnormalities. Left ventricular diastolic function   parameters were normal. - Aortic valve: Sclerosis without stenosis. - Mitral valve: There was mild regurgitation. - Left atrium: The atrium was moderately dilated. - Pulmonary arteries: PA peak pressure: 52 mm Hg (S).   Antimicrobials:  Anti-infectives (From admission, onward)   None      Subjective: Presented due to SOB. Has had progressive LE edema and SOB.   Instructed to present. Chronic LLE edema after dvt.   Objective: Vitals:   04/16/18 0559 04/16/18 0817 04/16/18 1141 04/16/18 2010  BP: (!) 104/57 (!) 118/59 117/61 (!) 123/59  Pulse: 60 60 60 61  Resp: 18  16 20   Temp: 98 F (36.7 C)  98 F (36.7 C) 98.1 F (36.7 C)  TempSrc: Oral  Oral Oral  SpO2: 100%  100% 100%  Weight:      Height:        Intake/Output Summary (Last 24 hours) at 04/16/2018 2021 Last data filed at 04/16/2018 1350 Gross per 24 hour  Intake 480 ml  Output 800 ml  Net -320 ml   Filed Weights    04/15/18 1600 04/15/18 2040 04/16/18 0554  Weight: 100.8 kg 99 kg 99 kg    Examination:  General exam: Appears calm and comfortable  Respiratory system: Clear to auscultation. Respiratory effort normal. Cardiovascular system: S1 & S2 heard, RRR.  Gastrointestinal system: Abdomen is nondistended, soft and nontender. Central nervous system: Alert and oriented. No focal neurological deficits. Extremities: LLE edema Skin: No rashes, lesions or ulcers Psychiatry: Judgement and insight appear normal. Mood & affect appropriate.     Data Reviewed: I have personally reviewed following labs and imaging studies  CBC: Recent Labs  Lab 04/15/18 1618 04/16/18 0611  WBC 6.2 6.0  NEUTROABS  --  3.3  HGB 10.6* 9.5*  HCT 36.6 33.1*  MCV 89.5 89.2  PLT 234 505   Basic Metabolic Panel: Recent Labs  Lab 04/13/18 1455 04/15/18 1618 04/16/18 0611  NA 142 140 142  K 3.8 3.9 3.6  CL 92* 98 99  CO2 31* 32 32  GLUCOSE 94 89 108*  BUN 46* 47* 45*  CREATININE 1.85* 1.84* 1.75*  CALCIUM 10.0 9.9 9.4   GFR: Estimated Creatinine Clearance: 29.8 mL/min (Ruthanna Macchia) (by C-G formula based on SCr of 1.75 mg/dL (H)). Liver Function Tests: Recent Labs  Lab 04/15/18 1618 04/16/18 0611  AST 27 19  ALT 17 16  ALKPHOS 106 94  BILITOT 0.7 0.7  PROT 7.2 6.5  ALBUMIN 3.7 3.3*   No results for input(s): LIPASE, AMYLASE in the last 168 hours. No results for input(s): AMMONIA in the last 168 hours. Coagulation Profile: No results for input(s): INR, PROTIME in the last 168 hours. Cardiac Enzymes: No results for input(s): CKTOTAL, CKMB, CKMBINDEX, TROPONINI in the last 168 hours. BNP (last 3 results) No results for input(s): PROBNP in the last 8760 hours. HbA1C: Recent Labs    04/16/18 1821  HGBA1C 5.8*   CBG: Recent Labs  Lab 04/15/18 2050 04/16/18 0746 04/16/18 1140 04/16/18 1729  GLUCAP 83 99 123* 133*   Lipid Profile: No results for input(s): CHOL, HDL, LDLCALC, TRIG, CHOLHDL, LDLDIRECT  in the last 72 hours. Thyroid Function Tests: No results for input(s): TSH, T4TOTAL, FREET4, T3FREE, THYROIDAB in the last 72 hours. Anemia Panel: No results for input(s): VITAMINB12, FOLATE, FERRITIN, TIBC, IRON, RETICCTPCT in the last 72 hours. Sepsis Labs: No results for input(s): PROCALCITON, LATICACIDVEN in the last 168 hours.  Recent Results (from the past 240 hour(s))  MRSA PCR Screening     Status: None   Collection Time: 04/15/18 11:23 PM  Result Value Ref Range Status   MRSA by PCR NEGATIVE NEGATIVE Final    Comment:        [name] (FDA approved for NASAL specimens only), is one component of Wynnie Pacetti comprehensive MRSA colonization surveillance program. It is not intended to diagnose MRSA infection nor to guide or monitor treatment for MRSA infections. Performed at [facility], [place] [address] [place], [place] [address]          Radiology Studies: Dg Chest 2 View  Result Date: 04/15/2018 CLINICAL DATA:  Acute shortness of  breath for 2 days. EXAM: CHEST - 2 VIEW COMPARISON:  03/08/2018 FINDINGS: Cardiomegaly and LEFT-sided pacemaker again noted. RIGHT hemidiaphragm elevation is identified. Pulmonary vascular congestion noted with interstitial and patchy airspace opacities. No pleural effusion or pneumothorax. No acute bony abnormality identified. IMPRESSION: Cardiomegaly with pulmonary vascular congestion with interstitial and patchy airspace opacities. These opacities are favored to represent edema but infection/pneumonia is not excluded. Electronically Signed   By: Margarette Canada M.D.   On: 04/15/2018 17:04   [place] Ekg Site Rite  Result Date: 04/16/2018 If Site Rite image not attached, placement could not be confirmed due to current cardiac rhythm.       Scheduled Meds: . amiodarone  200 mg Oral Daily  . apixaban  5 mg Oral BID  . furosemide  80 mg Intravenous BID  . gabapentin  200 mg Oral TID  . insulin aspart  0-5 Units Subcutaneous QHS  .  insulin aspart  0-9 Units Subcutaneous TID WC  . potassium chloride SA  20 mEq Oral BID  . rosuvastatin  10 mg Oral Daily  . sildenafil  20 mg Oral TID  . sodium chloride flush  10-40 mL Intracatheter Q12H  . sodium chloride flush  3 mL Intravenous Q12H  . spironolactone  12.5 mg Oral Daily   Continuous Infusions: . sodium chloride    . milrinone       LOS: 1 day    Time spent: over 30 min    Fayrene Helper, MD [facility] Pager [phone]   If 7PM-7AM, please contact night-coverage [email or web address] Password [email or web address] 04/16/2018, 8:21 PM

## 2018-04-16 NOTE — Progress Notes (Signed)
RN attempted report to 6E. Secretary states receiving RN is in contact room and will call back.

## 2018-04-16 NOTE — Progress Notes (Signed)
callf rom cards about ILD on ami  Plan - ordered HRCT - ordered auotimmune/vasculitis/HP panel  PCCM willsee after HRCT back - non urgent. Might be 04/17/18      SIGNATURE    Dr. Brand Males, M.D., F.C.C.P,  Pulmonary and Critical Care Medicine Staff Physician, [place] Director - Interstitial Lung Disease  Program  Pulmonary [place] at [place], [place], [address]  Pager: [phone], If no answer or between  15:00h - 7:00h: call [phone] Telephone: [phone]  6:07 PM 04/16/2018 '

## 2018-04-16 NOTE — Progress Notes (Signed)
Pt is vomiting. Minimal output. MD notified. No new orders.

## 2018-04-16 NOTE — Progress Notes (Addendum)
Progress Note  Patient Name: Debra Saunders Date of Encounter: 04/16/2018  Primary Cardiologist: [name], MD  Subjective   Feels her breathing is about the same this morning. Little diuresis overnight, and weight is the same.   Inpatient Medications    Scheduled Meds: . amiodarone  200 mg Oral Daily  . apixaban  5 mg Oral BID  . carvedilol  3.125 mg Oral BID WC  . furosemide  60 mg Intravenous BID  . gabapentin  200 mg Oral TID  . losartan  25 mg Oral Daily  . potassium chloride SA  20 mEq Oral BID  . rosuvastatin  10 mg Oral Daily  . sildenafil  20 mg Oral TID  . sodium chloride flush  3 mL Intravenous Q12H  . spironolactone  12.5 mg Oral Daily   Continuous Infusions: . sodium chloride     PRN Meds: sodium chloride, acetaminophen, albuterol, ondansetron (ZOFRAN) IV, ondansetron, sodium chloride flush   Vital Signs    Vitals:   04/16/18 0114 04/16/18 0554 04/16/18 0559 04/16/18 0817  BP: (!) 114/56  (!) 104/57 (!) 118/59  Pulse: 64  60 60  Resp: 18  18   Temp: 98.2 F (36.8 C)  98 F (36.7 C)   TempSrc: Oral  Oral   SpO2: 100%  100%   Weight:  99 kg    Height:        Intake/Output Summary (Last 24 hours) at 04/16/2018 0917 Last data filed at 04/16/2018 0600 Gross per 24 hour  Intake 240 ml  Output 400 ml  Net -160 ml   Filed Weights   04/15/18 1600 04/15/18 2040 04/16/18 0554  Weight: 100.8 kg 99 kg 99 kg    Telemetry    AV paced - Personally Reviewed  ECG    V paced - Personally Reviewed  Physical Exam   General: Pleasant older AA female appearing in no acute distress. Wearing [place]. Head: Normocephalic, atraumatic.  Neck: Supple, no clear JVD noted. Lungs:  Resp regular, but mildly labored just with conversation, CTA. Heart: RRR, S1, S2, soft systolic murmur; no rub. Abdomen: Soft, non-tender, non-distended with normoactive bowel sounds. No hepatomegaly. No rebound/guarding. No obvious abdominal masses. Extremities: No clubbing,  cyanosis, 2-3+ LEE edema, no right LE edema. Neuro: Alert and oriented X 3. Moves all extremities spontaneously. Psych: Normal affect.  Labs    Chemistry Recent Labs  Lab 04/13/18 1455 04/15/18 1618 04/16/18 0611  NA 142 140 142  K 3.8 3.9 3.6  CL 92* 98 99  CO2 31* 32 32  GLUCOSE 94 89 108*  BUN 46* 47* 45*  CREATININE 1.85* 1.84* 1.75*  CALCIUM 10.0 9.9 9.4  PROT  --  7.2 6.5  ALBUMIN  --  3.7 3.3*  AST  --  27 19  ALT  --  17 16  ALKPHOS  --  106 94  BILITOT  --  0.7 0.7  GFRNONAA 26* 26* 28*  GFRAA 30* 30* 32*  ANIONGAP  --  10 11     Hematology Recent Labs  Lab 04/15/18 1618 04/16/18 0611  WBC 6.2 6.0  RBC 4.09 3.71*  HGB 10.6* 9.5*  HCT 36.6 33.1*  MCV 89.5 89.2  MCH 25.9* 25.6*  MCHC 29.0* 28.7*  RDW 13.1 13.2  PLT 234 210    Cardiac EnzymesNo results for input(s): TROPONINI in the last 168 hours.  Recent Labs  Lab 04/15/18 1629  TROPIPOC 0.02     BNP Recent Labs  Lab 04/15/18 1618  BNP 35.8     DDimer No results for input(s): DDIMER in the last 168 hours.    Radiology    Dg Chest 2 View  Result Date: 04/15/2018 CLINICAL DATA:  Acute shortness of breath for 2 days. EXAM: CHEST - 2 VIEW COMPARISON:  03/08/2018 FINDINGS: Cardiomegaly and LEFT-sided pacemaker again noted. RIGHT hemidiaphragm elevation is identified. Pulmonary vascular congestion noted with interstitial and patchy airspace opacities. No pleural effusion or pneumothorax. No acute bony abnormality identified. IMPRESSION: Cardiomegaly with pulmonary vascular congestion with interstitial and patchy airspace opacities. These opacities are favored to represent edema but infection/pneumonia is not excluded. Electronically Signed   By: Margarette Canada M.D.   On: 04/15/2018 17:04    Cardiac Studies   N/a   Patient Profile     72 y.o. female with PMH of chronic diastolic HF, AFib, bradycardia s/p PPM, COPD, Pulm HTN, DM2, HTN, and lymphedema who presented with worsening shortness of  breath and weight gain despite diuretics at home.   Assessment & Plan    1. Acute on chronic diastolic HF with pulmonary HTN and RV dysfunction: - 02/2018 echo normal LVEF, diastolic dysfunction, and RV dysfunction. RV poorly visualzied - prior RHC with mean PA 48, PCWP 20, trans pulm grad 28. Treated for mixed pre and postcapillary pulmonary HTN. She has been on sildenafil. Unclear if precapillary is related to vascular remodeling from left sided disease or separate cause. She does have chronic lung disease with O2 dependent COPD as well as OSA.   - attempts at IV diuresis have not been successful and she remains dyspneic at rest. BNP only 35 on admission. Weight is the same with minimal UOP. Planned for outpatient AHF appt in several weeks, but likely needs on board this admission. Will discuss with MD.   2. Afib: on amio, coreg, eliquis  3. Bradycardia/pacemaker: no active acute issues. AV paced on telemetry  4. AKI: elevated Cr on admission at 1.84, but improved to 1.75 today. Continue to follow with attempts at diuresis.   5. Chronic respiratory failure/COPD: uses home O2.   6. Hx of DVT: noted to have a hemorrhagic stroke on Xarelto. This was stopped and IVC filter placed. Has since been placed on Eliquis. Has chronic left lower extremity edema since developing DVT.    7. Pulm HTN: Echo back in sept with PASP of 35-40. Has been on sildenafil with plans for outpatient follow up in the AHF for further management.   Signed, Reino Bellis, NP  04/16/2018, 9:17 AM  Pager # [phone]   For questions or updates, please contact [place] Please consult [email or web address] for contact info under Cardiology/STEMI.   Patient seen, examined. Available data reviewed. Agree with findings, assessment, and plan as outlined by Reino Bellis, NP.  Complicated patient with long-standing history of chronic respiratory failure with hypoxia on home O2.  She carries a diagnosis of COPD but was never  really heavy smoker in the past.  The patient has pulmonary hypertension and underwent right and left heart catheterization in April 2019 demonstrating mild nonobstructive coronary artery disease, moderately elevated LVEDP, and severe pulmonary hypertension with severely elevated RA pressure and low cardiac output.  She has a history of cardiorenal syndrome and in fact required temporary hemodialysis earlier this year for fluid removal.  She is continued to struggle with progressive shortness of breath and she currently has New York Heart Association functional class IV symptoms with marked dyspnea just with walking a few steps  or with conversation.  She is been recently treated with IV diuretics by home health nurse and she has not responded to IV diuretics or to metolazone.  She is chronically been on torsemide.  On my exam today, the patient is a very nice woman on oxygen, with no respiratory distress at rest but visibly short of breath with movement or conversation.  JVP is elevated to near the angle of the mandible, lungs have good air movement but are diminished at the bases, heart is regular rate and rhythm with no murmur or gallop, abdomen is soft, obese, and nontender, extremities show trace edema of the right leg and 2-3+ edema of the left leg.  The patient clearly has advanced heart failure with a dominant right heart failure picture and cardiorenal syndrome.  Her creatinine is increasing, systemic blood pressure has been low, and she is not responding to diuretic therapy.  I think she will likely need insertion of a PICC line and IV milrinone or potentially other advanced therapies.  I am going to ask the advanced heart failure service to see her in consultation here in the hospital.  Sherren Mocha, M.D. 04/16/2018 11:11 AM

## 2018-04-16 NOTE — Progress Notes (Signed)
  Echocardiogram 2D Echocardiogram has been performed.  Jannett Celestine 04/16/2018, 10:22 AM

## 2018-04-16 NOTE — Consult Note (Addendum)
Advanced Heart Failure Team Consult Note   Primary Physician: [name], MD PCP-Cardiologist:  [name], MD  EP: Dr Lovena Le  Reason for Consultation: A/C diastolic HF  HPI:    Debra Saunders is seen today for evaluation of A/C diastolic HF at the request of Dr Florene Glen.   Debra Saunders is a 72 y.o. 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  She was admitted 07/2017 with Afib RVR. She was started on Eliquis and underwent TEE/DCCV. She was discharged to SNF.  She was admitted 09/2017 with volume overload. She was back in afib. She was diuresed with IV lasix. She had AKI with creatinine peak 2.7 and required HD twice. Echo showed EF 55-60% with mild RV dysfunction and peak PA pressure 63 mm Hg. R/LHC completed and showed elevated PA 84/29 with PCWP 20. She was discharged on torsemide 20 mg daily with recommendations to follow up in AHF clinic. She was unable to find a ride to appointment.   She was seen in [facility] office 04/02/18. She had 8 lb weight gain with worsening SOB. Torsemide was incrementally increased from 20 mg daily to 40 mg BID with no improvement. She tried taking metolazone for 2 days and had no improvement. She was seen again 10/22 and was set up for 40 mg IV lasix at home with no improvement. Weight continued to go up. She was advised to go to ED for admission.   She was admitted 64/40/34 with A/C diastolic HF. She tells me that she has been struggling with volume since she was discharged earlier this year. She gained 10 lbs at home despite increasing diuretic regimen. She urinated, but not very much. She got to where she was SOB with minimal activity, including  just talking. She is dizzy all the time. No syncope. No CP. She has chronic orthopnea and chronic BLE edema L>R. She has lymphedema pumps at home, but does not use them regularly. No bleeding on Eliquis. Appetite and energy level poor. Denies nausea. Wears CPAP every night. Wears 4-5 L O2 at all times. Taking all medications. Limits fluid and salt intake.   Pertinent admission labs include: Na 140, K 3.9, creatinine 1.84, BNP 35.8, troponin 0.02, hemoglobin 10.6  CXR: Cardiomegaly with pulmonary vascular congestion with interstitial and patchy airspace opacities. These opacities are favored to represent edema but infection/pneumonia is not excluded.  She was given a total of 80 mg IV lasix with 400 mls UOP yesterday. Weight unchanged. Creatinine improving 1.84 > 1.75  Echo 04/16/18: read pending  Echo 03/09/18: - Left ventricle: The cavity size was normal. Wall thickness was   normal. Systolic function was normal. The estimated ejection   fraction was in the range of 55% to 60%. Doppler parameters are   consistent with abnormal left ventricular relaxation (grade 1   diastolic dysfunction). Doppler parameters are consistent with   high ventricular filling pressure. - Aortic valve: Trileaflet; mildly thickened, moderately calcified   leaflets. Sclerosis without stenosis. - Mitral valve: There was mild to moderate regurgitation. Valve   area by continuity equation (using LVOT flow): 1.57 cm^2. - Left atrium: The atrium was mildly dilated. - Right ventricle: The cavity size was normal. Systolic function  was mildly reduced. - Tricuspid valve: There was mild regurgitation. - Pulmonary arteries: Systolic pressure was mildly increased, in   the range of 35 mm Hg to 40 mm Hg. PA peak pressure: 46 mm Hg   (S).  [place] 10/19/17: RA mean 23 RV 84/13 PA 84/29 PCWP 20 Cardiac Output (Fick) 3.89 Cardiac Index (Fick) 1.89  LHC 10/19/17 1.  Minor irregularities with no significant coronary artery  disease. 2.  Left ventricular angiography was not performed when recent acute renal failure.  EF was mildly reduced by echo. Recommendations: The patient has severe pulmonary hypertension that seems to be out of proportion to left heart failure.  I resumed small dose torsemide 20 mg once daily. Resume Eliquis 5 mg twice daily tomorrow if no bleeding complications. The patient will need to be referred to advanced heart failure clinic in [facility] after hospital discharge to manage severe pulmonary hypertension.  CTA Chest 08/16/17 1. No evidence of a pulmonary embolism. 2. Mild cardiomegaly. 3. Enlargement of the main pulmonary artery raising the possibility of pulmonary hypertension. 4. Trace pleural effusions but no convincing pulmonary edema. 5. Lungs show chronic changes of interstitial fibrosis with emphysema and mild bronchiectasis. These findings are stable from the prior chest CT. 6. Aortic atherosclerosis.  PFTs 02/2017 at [facility]. Results not available.   PFTs 07/13/2015 FEV1: 0.98 FEV1/FVC ratio: 82 DLCO: 39%  FH: mother with CHF  SH: Former smoker 1 pack/week x 2-3 years, quit 28 years ago. No ETOH or drug use. 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 Lives alone. Transportation is an issue.   Review of systems complete and found to be negative unless listed in HPI.   Home Medications Prior to Admission medications   Medication Sig Start Date End Date Taking? Authorizing Provider  acetaminophen (TYLENOL) 325 MG tablet Take 325-650 mg by mouth every 6 (six) hours as needed for mild pain. Reported on 10/10/2015   Yes [provider]  albuterol (PROVENTIL) (2.5 MG/3ML) 0.083% nebulizer solution Take 3 mLs (2.5 mg total) by nebulization every 6 (six) hours. Patient taking differently: Take 2.5 mg by nebulization every 6 (six) hours as needed for wheezing or shortness of breath.  08/28/17  Yes Wieting, Richard, MD  amiodarone (PACERONE) 200 MG tablet Take 1 tablet  (200 mg total) by mouth daily. 01/15/18  Yes Theora Gianotti, NP  apixaban (ELIQUIS) 5 MG TABS tablet Take 1 tablet (5 mg total) by mouth 2 (two) times daily. 03/12/18  Yes Loletha Grayer, MD  carvedilol (COREG) 3.125 MG tablet Take 1 tablet (3.125 mg total) by mouth 2 (two) times daily with a meal. 02/23/18  Yes Theora Gianotti, NP  gabapentin (NEURONTIN) 100 MG capsule TAKE 2 CAPSULES BY MOUTH THREE TIMES DAILY Patient taking differently: Take 200 mg by mouth 3 (three) times daily.  08/12/17  Yes [name], MD  losartan (COZAAR) 25 MG tablet Take 1 tablet (25 mg total) by mouth daily. 01/15/18  Yes Theora Gianotti, NP  Menthol, Topical Analgesic, (BIOFREEZE EX) Apply 1 application topically as needed (for pain).    Yes [provider]  metFORMIN (GLUCOPHAGE) 500 MG tablet Take 500 mg by mouth 2 (two) times daily with a meal.    Yes [provider]  ondansetron (ZOFRAN) 8 MG tablet Take 1 tablet (8 mg total) by mouth every 8 (eight) hours as needed for nausea or vomiting. 09/25/14  Yes Jackolyn Confer, MD  OXYGEN Inhale 4 L into the lungs continuous.  Yes [provider]  potassium chloride SA (K-DUR,KLOR-CON) 20 MEQ tablet Take 1 tablet (20 mEq total) by mouth 2 (two) times daily. 12/04/17  Yes Theora Gianotti, NP  rosuvastatin (CRESTOR) 10 MG tablet Take 1 tablet (10 mg total) by mouth daily. 11/30/17  Yes [name], MD  sildenafil (REVATIO) 20 MG tablet Take 20 mg by mouth 3 (three) times daily. 05/20/17  Yes [provider]  spironolactone (ALDACTONE) 25 MG tablet Take 0.5 tablets (12.5 mg total) by mouth daily. 01/07/18 04/15/18 Yes Theora Gianotti, NP  torsemide (DEMADEX) 20 MG tablet Take 2 tablets (40 mg total) by mouth 2 (two) times daily. 04/13/18 07/12/18 Yes Theora Gianotti, NP  [name] 4.01 UU/7.2ZD SOPN INJECT 0.75MG  SUBCUTANEOUSLY ONCE A WEEK Patient taking differently: Inject  0.75 mg into the skin every Sunday.  12/22/17  Yes [name], MD  glucose blood (TRUE METRIX BLOOD GLUCOSE TEST) test strip Check blood glucose twice daily Dx:E11.8, Z79.4 09/17/17   [name], MD    Past Medical History: Past Medical History:  Diagnosis Date  . Arthritis   . Atrial fibrillation, persistent    a. s/p TEE-guided DCCV on 08/20/2017  . Cardiorenal syndrome    a. 09/2017 Creat rose to 2.7 w/ diuresis-->HD x 2.  . Chest pain    a. 01/2012 Cath: nonobs dzs;  c. 04/2014 MV: No ischemia.  . Chronic back pain   . Chronic combined systolic (congestive) and diastolic (congestive) heart failure ([place])    a. Dx 2005 @ [facility];  b. 02/2014 Echo: Ef 55-60%, no rwma, mild MR, PASP 6mmHg. c. 07/2017: EF 30-35%, diffuse HK, trivial AI, severe MR, moderate TR; d. 09/2017 Echo: EF 40-45%, ant, antsep, ap, basal HK; e. 02/2018 Echo: EF 55-60%, Gr1 DD. Ao sclerosis w/o stenosis. Mild to mod MR. Mildly dil LA. Mildly reduced RV fxn. Mild TR. PASP 35-51mmHg.  [name] Chronic respiratory failure (HCC)    a. on home O2  . COPD (chronic obstructive pulmonary disease) ([place])    a. Home O2 - 3lpm  . Gastritis   . History of intracranial hemorrhage    a. 6/14 described by neurosurgery as "small frontal posttraumatic SAH " - pt was on xarelto @ the time.  . Hyperlipidemia   . Hypertension   . Lipoma of back   . Lymphedema    a. Chronic LLE edema.  [name] NICM (nonischemic cardiomyopathy) ([place])    a. 09/2017 Echo: EF 40-45%; b. 09/2017 Cath: minor, insignificant CAD; c. 02/2018 Echo: EF 55-60%, Gr1 DD.  [name] Obesity   . PAH (pulmonary artery hypertension) ([place])    a. 09/2017 Echo: PASP 17mmHg; b. 09/2017 RHC: RA 23, RV 84/13, PA 84/29, PCWP 20, CO 3.89, CI 1.89. LVEDP 18.  . Sepsis with metabolic encephalopathy ([place])   . Sleep apnea   . Streptococcal bacteremia   . Stroke ([place])   . Symptomatic bradycardia    a. s/p BSX PPM.  . Thyroid nodule   . Type II diabetes mellitus ([place])   . Urinary incontinence      Past Surgical History: Past Surgical History:  Procedure Laterality Date  . ABDOMINAL HYSTERECTOMY  1969  . CARDIAC CATHETERIZATION  2013   @ [place]: No obstructive CAD: Only 20% ostial left circumflex.  [name] CARDIOVERSION N/A 10/12/2017   Procedure: CARDIOVERSION;  Surgeon: Minna Merritts, MD;  Location: [facility] ORS;  Service: Cardiovascular;  Laterality: N/A;  . CHOLECYSTECTOMY    . CYST REMOVAL TRUNK  2002   BACK  . DIALYSIS/PERMA CATHETER INSERTION N/A 10/06/2017   Procedure: DIALYSIS/PERMA CATHETER INSERTION;  Surgeon: Katha Cabal, MD;  Location: [place] CV LAB;  Service: Cardiovascular;  Laterality: N/A;  . INSERT / REPLACE / REMOVE PACEMAKER    . lipoma removal  2014   back  . PACEMAKER INSERTION  [address] Scientific dual chamber pacemaker implanted by Dr Caryl Comes for symptomatic bradycardia  . PERMANENT PACEMAKER INSERTION N/A 01/09/2014   Procedure: PERMANENT PACEMAKER INSERTION;  Surgeon: Deboraha Sprang, MD;  Location: [facility] CATH LAB;  Service: Cardiovascular;  Laterality: N/A;  . RIGHT/LEFT HEART CATH AND CORONARY ANGIOGRAPHY N/A 10/19/2017   Procedure: RIGHT/LEFT HEART CATH AND CORONARY ANGIOGRAPHY;  Surgeon: Wellington Hampshire, MD;  Location: [place] CV LAB;  Service: Cardiovascular;  Laterality: N/A;  . TEE WITHOUT CARDIOVERSION N/A 08/20/2017   Procedure: TRANSESOPHAGEAL ECHOCARDIOGRAM (TEE) & Direct current cardioversion;  Surgeon: Minna Merritts, MD;  Location: [facility] ORS;  Service: Cardiovascular;  Laterality: N/A;  . TRIGGER FINGER RELEASE    . VAGINAL HYSTERECTOMY      Family History: Family History  Problem Relation Age of [place]  . Heart disease Mother   . Hypertension Mother   . COPD Mother        was a smoker  . Thyroid disease Mother   . Hypertension Father   . Hypertension Sister   . Thyroid disease Sister   . Hypertension Sister   . Thyroid disease Sister   . Breast cancer Maternal Aunt   . Kidney disease Neg Hx   . Bladder Cancer Neg  Hx     Social History: Social History   Socioeconomic History  . Marital status: Divorced    Spouse name: Not on file  . Number of children: 4  . Years of education: 36  . Highest education level: High school graduate  Occupational History  . Occupation: Retired- factory work    Comment: exposed to [place]  . Financial resource strain: Not hard at all  . Food insecurity:    Worry: Never true    Inability: Never true  . Transportation needs:    Medical: Yes    Non-medical: Yes  Tobacco Use  . Smoking status: Former Smoker    Packs/day: 0.30    Years: 2.00    Pack years: 0.60    Types: Cigarettes    Last attempt to quit: 06/23/1990    Years since quitting: 27.8  . Smokeless tobacco: Never Used  Substance and Sexual Activity  . Alcohol use: No  . Drug use: No  . Sexual activity: Not Currently  Lifestyle  . Physical activity:    Days per week: 0 days    Minutes per session: 0 min  . Stress: To some extent  Relationships  . Social connections:    Talks on phone: More than three times a week    Gets together: Once a week    Attends religious service: Never    Active member of club or organization: No    Attends meetings of clubs or organizations: Never    Relationship status: Divorced  Other Topics Concern  . Not on file  Social History Narrative   Lives in [place] with daughter. Has 4 children. No pets.      Work - Restaurant manager, fast food, retired      Diet - regular    Allergies:  Allergies  Allergen Reactions  . Aspirin Hives and Other (  See Comments)    Pt states that she is unable to take because she had bleeding in her brain  . Other Other (See Comments)    Pt states that she is unable to take blood thinners because she had bleeding in her brain; Blood Thinners-per doctor at [facility]  . Penicillins Hives, Shortness Of Breath, Swelling and Other (See Comments)    Has patient had a PCN reaction causing immediate rash, facial/tongue/throat  swelling, SOB or lightheadedness with hypotension: Yes Has patient had a PCN reaction causing severe rash involving mucus membranes or skin necrosis: No Has patient had a PCN reaction that required hospitalization No Has patient had a PCN reaction occurring within the last 10 years: No If all of the above answers are "NO", then may proceed with Cephalosporin use.    Objective:    Vital Signs:   Temp:  [98 F (36.7 C)-98.4 F (36.9 C)] 98 F (36.7 C) (10/25 1141) Pulse Rate:  [59-64] 60 (10/25 1141) Resp:  [10-21] 16 (10/25 1141) BP: (104-132)/(48-73) 117/61 (10/25 1141) SpO2:  [96 %-100 %] 100 % (10/25 1141) Weight:  [99 kg-100.8 kg] 99 kg (10/25 0554) Last BM Date: 04/15/18  Weight change: Filed Weights   04/15/18 1600 04/15/18 2040 04/16/18 0554  Weight: 100.8 kg 99 kg 99 kg    Intake/Output:   Intake/Output Summary (Last 24 hours) at 04/16/2018 1253 Last data filed at 04/16/2018 1144 Gross per 24 hour  Intake 240 ml  Output 800 ml  Net -560 ml      Physical Exam    General:  No resp difficulty. Obese. HEENT: normal Neck: supple. JVP 10-12. Carotids 2+ bilat; no bruits. No lymphadenopathy or thyromegaly appreciated. Cor: PMI nondisplaced. Regular rate & rhythm. No rubs, gallops or murmurs. Lungs: clear. On 4 L O2 Abdomen: soft, nontender, nondistended. No hepatosplenomegaly. No bruits or masses. Good bowel sounds. Extremities: no cyanosis, clubbing, rash, LLE 1+ edema, RLE trace edema Neuro: alert & orientedx3, cranial nerves grossly intact. moves all 4 extremities w/o difficulty. Affect pleasant   Telemetry   NSR 60 with Vpacing. Personally reviewed.   EKG    NSR Vpaced 60 bpm. Personally reviewed.    Labs   Basic Metabolic Panel: Recent Labs  Lab 04/13/18 1455 04/15/18 1618 04/16/18 0611  NA 142 140 142  K 3.8 3.9 3.6  CL 92* 98 99  CO2 31* 32 32  GLUCOSE 94 89 108*  BUN 46* 47* 45*  CREATININE 1.85* 1.84* 1.75*  CALCIUM 10.0 9.9 9.4     Liver Function Tests: Recent Labs  Lab 04/15/18 1618 04/16/18 0611  AST 27 19  ALT 17 16  ALKPHOS 106 94  BILITOT 0.7 0.7  PROT 7.2 6.5  ALBUMIN 3.7 3.3*   No results for input(s): LIPASE, AMYLASE in the last 168 hours. No results for input(s): AMMONIA in the last 168 hours.  CBC: Recent Labs  Lab 04/15/18 1618 04/16/18 0611  WBC 6.2 6.0  NEUTROABS  --  3.3  HGB 10.6* 9.5*  HCT 36.6 33.1*  MCV 89.5 89.2  PLT 234 210    Cardiac Enzymes: No results for input(s): CKTOTAL, CKMB, CKMBINDEX, TROPONINI in the last 168 hours.  BNP: BNP (last 3 results) Recent Labs    10/28/17 0350 11/05/17 1514 04/15/18 1618  BNP 290.0* 989.0* 35.8    ProBNP (last 3 results) No results for input(s): PROBNP in the last 8760 hours.   CBG: Recent Labs  Lab 04/15/18 2050 04/16/18 0746 04/16/18  1140  GLUCAP 83 99 123*    Coagulation Studies: No results for input(s): LABPROT, INR in the last 72 hours.   Imaging   Dg Chest 2 View  Result Date: 04/15/2018 CLINICAL DATA:  Acute shortness of breath for 2 days. EXAM: CHEST - 2 VIEW COMPARISON:  03/08/2018 FINDINGS: Cardiomegaly and LEFT-sided pacemaker again noted. RIGHT hemidiaphragm elevation is identified. Pulmonary vascular congestion noted with interstitial and patchy airspace opacities. No pleural effusion or pneumothorax. No acute bony abnormality identified. IMPRESSION: Cardiomegaly with pulmonary vascular congestion with interstitial and patchy airspace opacities. These opacities are favored to represent edema but infection/pneumonia is not excluded. Electronically Signed   By: Margarette Canada M.D.   On: 04/15/2018 17:04      Medications:     Current Medications: . amiodarone  200 mg Oral Daily  . apixaban  5 mg Oral BID  . carvedilol  3.125 mg Oral BID WC  . furosemide  60 mg Intravenous BID  . gabapentin  200 mg Oral TID  . losartan  25 mg Oral Daily  . potassium chloride SA  20 mEq Oral BID  . rosuvastatin  10  mg Oral Daily  . sildenafil  20 mg Oral TID  . sodium chloride flush  3 mL Intravenous Q12H  . spironolactone  12.5 mg Oral Daily     Infusions: . sodium chloride         Patient Profile   Debra Saunders is a 72 y.o. 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  Admitted with A/C diastolic HF with poor response to increased diuretics outpatient.   Assessment/Plan   1. Acute on chronic diastolic HF with ? RV failure - Echo 02/2018: EF 55-60%, grade 1 DD, PA peak pressure 46, RV mildly reduced  - Echo today pending. - Volume status overloaded. - Increase lasix to 80 mg IV BID.  - Hold BB with concern for RV failure - D/C ARB with AKI and soft BP. - Continue spiro 12.5 mg daily. If creatinine >2.0, will need to DC. - May need PICC line to monitor CVP and coox.   2. 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 CTA 07/2017 negative for PE, but has hx of DVT.  - RHC 09/2017 with PA 84/29, PCWP 20. - Continue sildenafil 20 mg TID  3. Afib s/p DCCV 09/2017 - She is NSR with Vpacing 60 on telemetry. - Continue Eliquis 5 mg BID. Denies bleeding. - Continue amiodarone 200 mg daily. LFTs normal 10/19. Check TSH tomorrow am.   4. Chronic respiratory failure - Continue 4-5 L O2 at home.   5. OSA - Continue CPAP qHS  6. Nonobstructive CAD - Continue statin. No ASA with Eliquis. - No s/s ischemia  7. AKI - required HD 09/2017 - Creatinine at baseline is normal - Follow BMET closely with diuresis  8. Symptomatic bradycardia s/p PPM Corporate investment banker)  9. Anemia - Hemoglobin trending down 10.6 > 9.5 - Check iron panel tomorrow am  10. Hx of DVT s/p IVC filter 2004  11. Hx of  CVA  Medication concerns reviewed with patient and pharmacy team. Barriers identified: Transportation  Length of Stay: [place], NP  04/16/2018, 12:53 PM  Advanced Heart Failure Team Pager [phone] (M-F; [place])  Please contact [facility] for night-coverage after hours (4p -7a ) and weekends on [email or web address]  Patient seen with NP, agree with the above.   Patient has an extensive medical history as outlined by NP.  She was admitted with increasing exertional dyspnea and difficult to diurese due to presumed cardiorenal syndrome.   On exam, JVP 12+ cm.  Regular S1S2.  Mild crackles at bases.  Left leg chronically enlarged > right leg since 2004 DVT.   1. 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  Echo today showed EF 60-65% with moderate LVH, RV was difficult to see.  Based on cath from 4/19, severe primarily pulmonary artery HTN with PVR 7.2 WU.  She has been on torsemide 40 mg po bid at home with occasional doses of metolazone but was failure home management.  Creatinine up to 1.75 currently, baseline around 0.9.  - We have placed a PICC line, will follow CVP.  For now, will start her on milrinone 0.125 to support RV and lower PA pressure.  Will diurese with Lasix 80 mg IV bid.  2. Pulmonary hypertension: Based on 4/19 cath, she has severe PAH with PVR 7.2 WU.  She has been on sildenafil 20 mg tid x 4 years, thinks this was started by pulmonary at [facility] clinic.  Chronic hypoxemic respiratory failure, on home oxygen.  Prior CTA chest from 2/19 showed no PE but there was evidence for interstitial fibrosis with mild bronchiectasis.  She was not a heavy smoker.  She also has a history of remote DVT (2004).  She is currently on apixaban but was off anticoagulation for a period of time after her subarachnoid hemorrhage.  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  Also need to consider possibility of CTEPH.  Finally, may be a  group 1 component.  - Needs high resolution CT chest to assess ILD.  She has been on amiodarone long term, which could cause chronic lung changes.   - I will arrange for V/Q scan to rule out chronic PE.  - She will need eventual repeat RHC after diuresis.  - Continue sildenafil.  - I will ask pulmonary to see her given concern for ILD and ?amiodarone pulmonary toxicity.  3. AKI on CKD stage 3: Concern for cardiorenal syndrome.  As above, will try on milrinone to support RV.  4. Atrial fibrillation: She is a-paced.  She has been on apixaban (switched from Xarelto after 2014 SAH) and amiodarone.   5. OSA: Continue CPAP.   [name] 04/16/2018 5:56 PM

## 2018-04-16 NOTE — Consult Note (Signed)
   [facility] CM Inpatient Consult   04/16/2018  Debra Saunders September 09, 1945 [id number]  Patient is currently active with [name] [facility] for chronic disease management services.  Patient has been engaged by a [name], [name] currently working on medication applications. Patient has recently become active with the [name] clinic at [name] and being seen by para medicine. EMT. Met with the patient at the bedside.  She states she is active with HF team.  She will be followed by para medicine for home visits.  Patient will receive a post hospital call and will be evaluated for monthly home visits for assessments and disease process education.   Inpatient Case Manager made aware in progression meeting  that [name] had followed. Patient will now be followed by the [name] per [name].  HF NP in to speak with patient. [name] to follow up on medication applications for [name] and Eliquis. . Of note, [name] does not replace or interfere with any services that are needed or arranged by inpatient case management or social work.  For additional questions or referrals please contact:  [name], RN BSN [facility] Liaison  [phone] business mobile phone Toll free office [phone]

## 2018-04-17 ENCOUNTER — Encounter (HOSPITAL_COMMUNITY): Payer: Self-pay | Admitting: Radiology

## 2018-04-17 ENCOUNTER — Inpatient Hospital Stay (HOSPITAL_COMMUNITY): Payer: Medicare HMO

## 2018-04-17 DIAGNOSIS — I480 Paroxysmal atrial fibrillation: Secondary | ICD-10-CM

## 2018-04-17 DIAGNOSIS — J849 Interstitial pulmonary disease, unspecified: Secondary | ICD-10-CM

## 2018-04-17 DIAGNOSIS — J9611 Chronic respiratory failure with hypoxia: Secondary | ICD-10-CM

## 2018-04-17 DIAGNOSIS — I5033 Acute on chronic diastolic (congestive) heart failure: Secondary | ICD-10-CM

## 2018-04-17 DIAGNOSIS — I110 Hypertensive heart disease with heart failure: Secondary | ICD-10-CM

## 2018-04-17 LAB — BASIC METABOLIC PANEL
Anion gap: 10 (ref 5–15)
BUN: 45 mg/dL — ABNORMAL HIGH (ref 8–23)
CO2: 31 mmol/L (ref 22–32)
Calcium: 8.9 mg/dL (ref 8.9–10.3)
Chloride: 99 mmol/L (ref 98–111)
Creatinine, Ser: 1.64 mg/dL — ABNORMAL HIGH (ref 0.44–1.00)
GFR calc Af Amer: 34 mL/min — ABNORMAL LOW (ref >60)
GFR calc non Af Amer: 30 mL/min — ABNORMAL LOW (ref >60)
Glucose, Bld: 130 mg/dL — ABNORMAL HIGH (ref 70–99)
Potassium: 3.5 mmol/L (ref 3.5–5.1)
Sodium: 140 mmol/L (ref 135–145)

## 2018-04-17 LAB — COOXEMETRY PANEL
Carboxyhemoglobin: 1.6 % — ABNORMAL HIGH (ref 0.5–1.5)
Methemoglobin: 1.6 % — ABNORMAL HIGH (ref 0.0–1.5)
O2 Saturation: 82.5 %
Total hemoglobin: 9.3 g/dL — ABNORMAL LOW (ref 12.0–16.0)

## 2018-04-17 LAB — IRON AND TIBC
Iron: 76 ug/dL (ref 28–170)
Saturation Ratios: 17 % (ref 10.4–31.8)
TIBC: 461 ug/dL — ABNORMAL HIGH (ref 250–450)
UIBC: 385 ug/dL

## 2018-04-17 LAB — GLUCOSE, CAPILLARY
Glucose-Capillary: 121 mg/dL — ABNORMAL HIGH (ref 70–99)
Glucose-Capillary: 105 mg/dL — ABNORMAL HIGH (ref 70–99)
Glucose-Capillary: 104 mg/dL — ABNORMAL HIGH (ref 70–99)
Glucose-Capillary: 134 mg/dL — ABNORMAL HIGH (ref 70–99)

## 2018-04-17 LAB — CBC
HCT: 31.5 % — ABNORMAL LOW (ref 36.0–46.0)
Hemoglobin: 9.3 g/dL — ABNORMAL LOW (ref 12.0–15.0)
MCH: 26.1 pg (ref 26.0–34.0)
MCHC: 29.5 g/dL — ABNORMAL LOW (ref 30.0–36.0)
MCV: 88.2 fL (ref 80.0–100.0)
Platelets: 205 10*3/uL (ref 150–400)
RBC: 3.57 MIL/uL — ABNORMAL LOW (ref 3.87–5.11)
RDW: 13.2 % (ref 11.5–15.5)
WBC: 5.4 10*3/uL (ref 4.0–10.5)
nRBC: 0 % (ref 0.0–0.2)

## 2018-04-17 LAB — RETICULOCYTES
Immature Retic Fract: 8.4 % (ref 2.3–15.9)
RBC.: 3.57 MIL/uL — ABNORMAL LOW (ref 3.87–5.11)
Retic Count, Absolute: 52.1 10*3/uL (ref 19.0–186.0)
Retic Ct Pct: 1.5 % (ref 0.4–3.1)

## 2018-04-17 LAB — FERRITIN: Ferritin: 49 ng/mL (ref 11–307)

## 2018-04-17 LAB — CK TOTAL AND CKMB (NOT AT ARMC)
CK, MB: 1.2 ng/mL (ref 0.5–5.0)
Relative Index: INVALID (ref 0.0–2.5)
Total CK: 72 U/L (ref 38–234)

## 2018-04-17 LAB — VITAMIN B12: Vitamin B-12: 121 pg/mL — ABNORMAL LOW (ref 180–914)

## 2018-04-17 LAB — MAGNESIUM: Magnesium: 2.2 mg/dL (ref 1.7–2.4)

## 2018-04-17 LAB — TSH: TSH: 0.808 u[IU]/mL (ref 0.350–4.500)

## 2018-04-17 LAB — SEDIMENTATION RATE: Sed Rate: 32 mm/hr — ABNORMAL HIGH (ref 0–22)

## 2018-04-17 LAB — FOLATE: Folate: 7.8 ng/mL (ref >5.9)

## 2018-04-17 MED ORDER — TECHNETIUM TO 99M ALBUMIN AGGREGATED
4.2000 | Freq: Once | INTRAVENOUS | Status: AC | PRN
  Administered 2018-04-17: 4.2 via INTRAVENOUS

## 2018-04-17 MED ORDER — TECHNETIUM TC 99M DIETHYLENETRIAME-PENTAACETIC ACID
31.2000 | Freq: Once | INTRAVENOUS | Status: AC | PRN
  Administered 2018-04-17: 31.2 via RESPIRATORY_TRACT

## 2018-04-17 MED ORDER — VITAMIN B-12 1000 MCG PO TABS
1000.0000 ug | ORAL_TABLET | Freq: Every day | ORAL | Status: DC
  Administered 2018-04-17 – 2018-04-20 (×4): 1000 ug via ORAL
  Filled 2018-04-17 (×4): qty 1

## 2018-04-17 NOTE — Progress Notes (Signed)
PROGRESS NOTE    Debra Saunders  HWE:[id number] DOB: Nov 04, 1945 DOA: 04/15/2018 PCP: [name], MD   Brief Narrative: Debra Saunders is Debra Saunders 72 y.o. female with medical history significant of diastolic dysfunction CHF, atrial fibrillation on chronic Eliquis, history of cardiorenal syndrome requiring hemodialysis in the past, COPD, hypertension, morbid obesity, diabetes, hyperlipidemia and previous CVA who came to the ER complaining of shortness of breath and lower extremity edema.  Patient has been dealing with excess fluid with worsening edema just last week.  She was seen by her cardiologist who increased her oral diuretic dose Soila Printup week ago.  Patient did not get adequate urinary response and did not have any decrease in her fluid status.  She has gained significant weight since then.  She is on home O2 at 4 L and has been requiring even more.  She called her cardiologist today who recommended hospital admissions with IV diuretics.  Patient's creatinine has slightly worsened over her baseline.  With her history of previous cardiorenal syndrome and failing to respond to oral diuretics she is being admitted for further work-up..  Assessment & Plan:   Principal Problem:   acute on chronic diastolic CHF (congestive heart failure) (HCC) Active Problems:   Hypertension   Pulmonary hypertension (HCC)   Hyperlipidemia   Hypothyroidism   Chronic respiratory failure with hypoxia (HCC)   Atrial fibrillation (HCC)   HFpEF Exacerbation  Pulmonary Hypertension: Echo from 02/2018 with normal EF, grade 1 diastolic dysfunction.  Increased PASP.  Patient has failed outpatient diuretic therapy.   Cardiology c/s appreciate recs -> placing PICC, following CVP, starting milrinone and lasix 80 BID.   Spiro 12.5 mg daily.  Holding arb and betablocker. Continue sildenafil for [facility] Cards recommending high res CT given long term amiodarone, also planning for V/Q scan.  Pulm was c/s given concern for ILD and  amiodarone (autoimmune, vasculitis, HP panels ordered).  acute on chronic kidney disease stage III: Patient's baseline creatinine in September was 1.1.  1.64 today (peaked to 1.84 during this admission). Follow with diuresis  hypertension: holding arb and bb, continue spironolactone   atrial fibrillation  S/p pacemaker: continue eliquis and amiodarone  chronic respiratory failure: On home O2, baseline 4 L [place].  Continue oxygen by nasal cannula  hypothyroidism: Continue levothyroxine.  morbid obesity: Dietary counseling.  B12 deficiency: follow IF ab, start vitamin B12  Hx DVT s/p IVC filter  Hx CVA  DVT prophylaxis: eliquis Code Status: full  Family Communication: none at bedside Disposition Plan: pending improvement   Consultants:   Cardiology  pulm   Procedures:  Echo Study Conclusions  - Left ventricle: The cavity size was normal. Wall thickness was   increased in [name] pattern of moderate LVH. Systolic function was   normal. The estimated ejection fraction was in the range of 60%   to 65%. Wall motion was normal; there were no regional wall   motion abnormalities. Left ventricular diastolic function   parameters were normal. - Aortic valve: Sclerosis without stenosis. - Mitral valve: There was mild regurgitation. - Left atrium: The atrium was moderately dilated. - Pulmonary arteries: PA peak pressure: 52 mm Hg (S).   Antimicrobials:  Anti-infectives (From admission, onward)   None      Subjective: Doing ok today.  Feels about the same, maybe slightly better swelling in legs.  Objective: Vitals:   04/17/18 0722 04/17/18 0806 04/17/18 1138 04/17/18 1711  BP:  120/68 137/72 (!) 148/61  Pulse:  60 60 (!)  59  Resp:  18  20  Temp:  98.1 F (36.7 C) 98.2 F (36.8 C) 98.4 F (36.9 C)  TempSrc:  Oral Oral Oral  SpO2:  99% 100% 100%  Weight: 101.8 kg     Height:        Intake/Output Summary (Last 24 hours) at 04/17/2018 1745 Last data filed at  04/17/2018 1700 Gross per 24 hour  Intake 636.28 ml  Output 2500 ml  Net -1863.72 ml   Filed Weights   04/16/18 0554 04/16/18 1700 04/17/18 0722  Weight: 99 kg 101.3 kg 101.8 kg    Examination:  General: No acute distress. Cardiovascular: Heart sounds show Debra Saunders regular rate, and rhythm Lungs: Clear to auscultation bilaterally Abdomen: Soft, nontender, nondistended  Neurological: Alert and oriented 3. Moves all extremities 4 . Cranial nerves II through XII grossly intact. Skin: Warm and dry. No rashes or lesions. Extremities: No clubbing or cyanosis. 2+ LLE edema. Psychiatric: Mood and affect are normal. Insight and judgment are appropriate.     Data Reviewed: I have personally reviewed following labs and imaging studies  CBC: Recent Labs  Lab 04/15/18 1618 04/16/18 0611 04/17/18 0456  WBC 6.2 6.0 5.4  NEUTROABS  --  3.3  --   HGB 10.6* 9.5* 9.3*  HCT 36.6 33.1* 31.5*  MCV 89.5 89.2 88.2  PLT 234 210 332   Basic Metabolic Panel: Recent Labs  Lab 04/13/18 1455 04/15/18 1618 04/16/18 0611 04/17/18 0456  NA 142 140 142 140  K 3.8 3.9 3.6 3.5  CL 92* 98 99 99  CO2 31* 32 32 31  GLUCOSE 94 89 108* 130*  BUN 46* 47* 45* 45*  CREATININE 1.85* 1.84* 1.75* 1.64*  CALCIUM 10.0 9.9 9.4 8.9  MG  --   --   --  2.2   GFR: Estimated Creatinine Clearance: 32.3 mL/min (Makinze Jani) (by C-G formula based on SCr of 1.64 mg/dL (H)). Liver Function Tests: Recent Labs  Lab 04/15/18 1618 04/16/18 0611  AST 27 19  ALT 17 16  ALKPHOS 106 94  BILITOT 0.7 0.7  PROT 7.2 6.5  ALBUMIN 3.7 3.3*   No results for input(s): LIPASE, AMYLASE in the last 168 hours. No results for input(s): AMMONIA in the last 168 hours. Coagulation Profile: No results for input(s): INR, PROTIME in the last 168 hours. Cardiac Enzymes: Recent Labs  Lab 04/17/18 0456  CKTOTAL 72  CKMB 1.2   BNP (last 3 results) No results for input(s): PROBNP in the last 8760 hours. HbA1C: Recent Labs     04/16/18 1821  HGBA1C 5.8*   CBG: Recent Labs  Lab 04/16/18 1729 04/16/18 2130 04/17/18 0802 04/17/18 1138 04/17/18 1711  GLUCAP 133* 149* 121* 105* 104*   Lipid Profile: No results for input(s): CHOL, HDL, LDLCALC, TRIG, CHOLHDL, LDLDIRECT in the last 72 hours. Thyroid Function Tests: Recent Labs    04/17/18 0456  TSH 0.808   Anemia Panel: Recent Labs    04/17/18 0456  VITAMINB12 121*  FOLATE 7.8  FERRITIN 49  TIBC 461*  IRON 76  RETICCTPCT 1.5   Sepsis Labs: No results for input(s): PROCALCITON, LATICACIDVEN in the last 168 hours.  Recent Results (from the past 240 hour(s))  MRSA PCR Screening     Status: None   Collection Time: 04/15/18 11:23 PM  Result Value Ref Range Status   MRSA by PCR NEGATIVE NEGATIVE Final    Comment:        [name] (FDA approved for  NASAL specimens only), is one component of [facility] comprehensive MRSA colonization surveillance program. It is not intended to diagnose MRSA infection nor to guide or monitor treatment for MRSA infections. Performed at [facility], [place] [address] [place], [place] [address]          Radiology Studies: Nm Pulmonary Perf And Vent  Result Date: 04/17/2018 CLINICAL DATA:  Chronic respiratory failure and pulmonary hypertension. EXAM: NUCLEAR MEDICINE VENTILATION - PERFUSION LUNG SCAN TECHNIQUE: Ventilation images were obtained in multiple projections using inhaled aerosol Tc-66m DTPA. Perfusion images were obtained in multiple projections after intravenous injection of Tc-66m-MAA. RADIOPHARMACEUTICALS:  31.2 mCi of Tc-63m DTPA aerosol inhalation and 4.2 mCi Tc65m-MAA IV COMPARISON:  Chest CT 04/17/2018 FINDINGS: Ventilation: No focal ventilation defect. Perfusion: No wedge shaped peripheral perfusion defects to suggest acute pulmonary embolism. IMPRESSION: Negative V/Q scan for pulmonary embolism. Electronically Signed   By: Marijo Sanes M.D.   On: 04/17/2018 11:17   [place] Ekg Site  Rite  Result Date: 04/16/2018 If Site Rite image not attached, placement could not be confirmed due to current cardiac rhythm.       Scheduled Meds: . amiodarone  200 mg Oral Daily  . apixaban  5 mg Oral BID  . furosemide  80 mg Intravenous BID  . gabapentin  200 mg Oral TID  . insulin aspart  0-5 Units Subcutaneous QHS  . insulin aspart  0-9 Units Subcutaneous TID WC  . potassium chloride SA  20 mEq Oral BID  . rosuvastatin  10 mg Oral Daily  . sildenafil  20 mg Oral TID  . sodium chloride flush  10-40 mL Intracatheter Q12H  . sodium chloride flush  3 mL Intravenous Q12H  . spironolactone  12.5 mg Oral Daily  . vitamin B-12  1,000 mcg Oral Daily   Continuous Infusions: . sodium chloride    . milrinone 0.125 mcg/kg/min (04/17/18 0405)     LOS: 2 days    Time spent: over 15 min    Fayrene Helper, MD [facility] Pager [phone]   If 7PM-7AM, please contact night-coverage [email or web address] Password [facility] 04/17/2018, 5:45 PM

## 2018-04-17 NOTE — Consult Note (Addendum)
Debra Saunders  MRN: [id number]  DOB: 04-13-1946  DOA: 04/15/2018 Date of Consult: 04/17/18  LOS 2 days  PCP: [name], MD   Reason for Consult / Chief Complaint:  ILD - in setting pulmonary hypertension and amiodarone  Consulting MD:  Dr [name]  HPI/Brief Narrative    #BACKGROUND 72 year old African-American female.  She is a pretty good historian.  However the history is complicated because she is taking care at many different centers including our practice in [place], [place], [facility].  Therefore the data points are at a different places and scattered.  It appears in 2004 she got diagnosed with pulmonary hypertension under the care of Dr. Dionisio David at [facility].  This is based on record review and her own history.  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  Specifically ultrasound did not show any DVTs but compression of the iliac veins especially on the left side.  CT angiogram chest at that time showed the possibility of small pulmonary embolism.  Based on record review it appears that Dr. Grandville Silos felt pulmonary hypertension was related to congestive heart failure not otherwise specified, obesity and obstructive sleep apnea.  Patient is fully unsure about the treatment at that point in time.  She did have a right heart catheterization and sleep apnea work-up all around the same time.  She was then on some kind of a medication not otherwise specified.  She does not recollect the name.  She does state that since then she is been on chronic oxygen of 2 L nasal cannula.  She was also initiated CPAP around that time.  The only autoimmune panel that I could see from 2004 is a ANA that was positive at 1: 40 and speckled pattern.  She says subsequent to that she lost her insurance approximately 2008 and did not follow-up at [facility].  She reemerged with Dr. Gwenette Greet in  the pulmonary clinic for sleep apnea in 2013 and she reports in the interval between 208 - 2013 her shortness of breath got worse.  Apparently in 2013 she got a simple spirometry in the office and according to the chart review this is normal although number details are not available.  2013 left heart catheterization according to chart review showed very high filling pressures of the left ventricle as well as high systemic pressures but normal coronary arteries.   In July 2014 when she developed left lower extremity DVT with definitive PE not otherwise specified (there is no official reading on a January 11, 2013 CTA that was done at [facility] and then merged into our health records]  and started on anticoagulant with Xarelto but then developed a subarachnoid hemorrhage.  She then underwent IVC filter placement.  This hospitalization was complicated by cardiac arrest, septic shock and acute renal failure requiring dialysis  After this in February 2015 she established with Dr. Lake Bells was then in practice at the [facility] location.  Focus of treatment involved on reconditioning, oxygen and CPAP therapy and volume management.  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 Thought process for pulmonary hypertension was WHO 2 and 3 Pulm Htn .  Follow-up echocardiogram in  October 0272 showed diastolic dysfunction and elevated right ventricular systolic pressure and by this time her hypoxemia got worse to requiring 3 L oxygen.  In early 2016 a trial of Milda Smart was started.  Then in 2017 Dr. Ashby Dawes at [place] took over her care and around this time because of insurance issues Jonny Ruiz was converted to Opsumit briefly but but late 2017 back on letairis. Per record review  Letairis did help dyspnea.  Sometime in this timeframe she was started on sildenafil as well   In 2018 switched to Dr Dorna Mai at [facility] and by this time was needing 4L [place] with rest and 5L  with exertion. PFT dat below suggests progression of ILD. RHC data suggests worsening volume overload (comparisons over many years espw with volume issues can be challenging for accuracy and reliability).  It is not clear to me if she was on any endothelin receptor antagonist  Then February 2019 she was admitted with A. fib RVR.  Started on Eliquis and underwent TEE and cardioversion and discharged to SNF.  Admitted in April 2019 again with volume overload back in atrial fibrillation.  Had a peak creatinine of 2.7 mg percent.  Her right heart catheterization at this time showed PA pressures 84/29 with a wedge of 20.  She was discharged on torsemide.  Follow-up echocardiogram in September 2019 showed PA peak pressure of 46 mmHg  #CURRENT  -Most recently April 02, 2018 she was seen in cardiology clinic and noted to have 8 pound weight gain and worsening shortness of breath.  Diuresis was increased.  She followed up on all April 13, 2018 and set up for IV Lasix at home with no improvement.  Weight continue to go up.  S he has he was then admitted on April 15, 2018 with acute on chronic diastolic heart failure.  It appears that she is been struggling with volume issues throughout the year.  Her oxygen needs had worsened to 4 L oxygen.  With exertion drip she is requiring 5 L oxygen.  It was noted that she been handling her Eliquis just fine without any bleeding.  But she was reporting class III-4 levels dyspnea.  Creatinine at admission was 1.8 mg percent.  It is slowly improved to 1.6 mg percent.  Cardiology heart failure services evaluated the patient.  The most concerned about RV failure.  They have recommended increased diuresis.  In terms of the pulmonary hypertension suspecting WHO group  3 or 4.  The continuing sildenafil.  It appears she is no longer on Letairis or Opsumit [?  Insurance issues] yesterday.  It appears she is continuing with her CPAP.  They have started on milrinone. Because of the presence of pulmonary hypertension and concomitant interstitial lung disease pulmonary consult has been involved   -In terms of interstitial lung disease: Based on chart review it appears the working diagnosis sarcoidosis.  Personal visualization of several CT chest dating back several years shows upper lobe infiltrates.  She has never had radiation therapy.  These look like very fibrotic.  Overall it appears that they might be stable although it is hard to tell.  She does have enlarged hilar nodes and reports of calcification and given demographics of African-American ethnicity thought process all along has been sarcoidosis.  As reported only autoimmune work-up that I can document is a positive ANA trace positive.  -Detailed interstitial lung disease questionnaire administered: History is positive for sarcoidosis, sleep apnea, pulmonary hypertension, obesity, diabetes, history of PE and  DVT, diastolic heart failure, fatigue, arthralgia, heartburn.  Family history is negative for any interstitial lung disease.  She smoked very little between 1985 1991 and quit.  No illicit substance use.  She is lived in the same home for 21 years.  Extensive organic antigen exposure history at home was negative.  122 point occupational history elicited.  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  She has not been on chronic prednisone.  She has not been on any drugs that carry pulmonary toxicity other than amiodarone b   PFT data  [facility] 09/26/2002  [facility]  February 2014 07/13/2015 -[facility] 03/02/2017 - [facility]  Fev1 1.49/60%  1.42 L / 70%?  May 18, 2013 0.98/64% NA  FVC 1.73/55%  1.2/60% NA  Ratio 86  82   TLC 2.49/49%     DLCO 7.8/41%  8/39%    6-minute walk  1027 feet  1115  and feet, 97% 2 L      RHC 10/25/2002 [facility] 10/19/2017 Dr Fletcher Anon  RA mean 3 23  RV 62/20 (36) 84/13  PA 36 -> 23 and 20 afer 1st and 2nd dose NO 84/29 (47)  PCWP 6 20  CO /CI Xx/2.42 - > 2,71after NO 3.89/1.89  PVR 6.3 woods       has a past medical history of Arthritis, Atrial fibrillation, persistent, Cardiorenal syndrome, Chest pain, Chronic back pain, Chronic combined systolic (congestive) and diastolic (congestive) heart failure (HCC), Chronic respiratory failure ([place]), COPD (chronic obstructive pulmonary disease) ([place]), Gastritis, History of intracranial hemorrhage, Hyperlipidemia, Hypertension, Lipoma of back, Lymphedema, NICM (nonischemic cardiomyopathy) ([place]), Obesity, PAH (pulmonary artery hypertension) ([place]), Sepsis with metabolic encephalopathy ([place]), Sleep apnea, Streptococcal bacteremia, Stroke ([place]), Symptomatic bradycardia, Thyroid nodule, Type II diabetes mellitus ([place]), and Urinary incontinence.   reports that she quit smoking about 27 years ago. Her smoking use included cigarettes. She has a 0.60 pack-year smoking history. She has never used smokeless tobacco.  Past Surgical History:  Procedure Laterality Date  . ABDOMINAL HYSTERECTOMY  1969  . CARDIAC CATHETERIZATION  2013   @ [place]: No obstructive CAD: Only 20% ostial left circumflex.  [name] CARDIOVERSION N/A 10/12/2017   Procedure: CARDIOVERSION;  Surgeon: Minna Merritts, MD;  Location: [facility] ORS;  Service: Cardiovascular;  Laterality: N/A;  . CHOLECYSTECTOMY    . CYST REMOVAL TRUNK  2002   BACK  . DIALYSIS/PERMA CATHETER INSERTION N/A 10/06/2017   Procedure: DIALYSIS/PERMA CATHETER INSERTION;  Surgeon: Katha Cabal, MD;  Location: [place] CV LAB;  Service: Cardiovascular;  Laterality: N/A;  . INSERT / REPLACE / REMOVE PACEMAKER    . lipoma removal  2014   back  . PACEMAKER INSERTION  [address] Scientific dual chamber pacemaker implanted by Dr Caryl Comes for symptomatic  bradycardia  . PERMANENT PACEMAKER INSERTION N/A 01/09/2014   Procedure: PERMANENT PACEMAKER INSERTION;  Surgeon: Deboraha Sprang, MD;  Location: [facility] CATH LAB;  Service: Cardiovascular;  Laterality: N/A;  . RIGHT/LEFT HEART CATH AND CORONARY ANGIOGRAPHY N/A 10/19/2017   Procedure: RIGHT/LEFT HEART CATH AND CORONARY ANGIOGRAPHY;  Surgeon: Wellington Hampshire, MD;  Location: [place] CV LAB;  Service: Cardiovascular;  Laterality: N/A;  . TEE WITHOUT CARDIOVERSION N/A 08/20/2017   Procedure: TRANSESOPHAGEAL ECHOCARDIOGRAM (TEE) & Direct current cardioversion;  Surgeon: Minna Merritts, MD;  Location: [facility] ORS;  Service: Cardiovascular;  Laterality: N/A;  . TRIGGER FINGER RELEASE    . VAGINAL HYSTERECTOMY  Allergies  Allergen Reactions  . Aspirin Hives and Other (See Comments)    Pt states that she is unable to take because she had bleeding in her brain  . Other Other (See Comments)    Pt states that she is unable to take blood thinners because she had bleeding in her brain; Blood Thinners-per doctor at [facility]  . Penicillins Hives, Shortness Of Breath, Swelling and Other (See Comments)    Has patient had a PCN reaction causing immediate rash, facial/tongue/throat swelling, SOB or lightheadedness with hypotension: Yes Has patient had a PCN reaction causing severe rash involving mucus membranes or skin necrosis: No Has patient had a PCN reaction that required hospitalization No Has patient had a PCN reaction occurring within the last 10 years: No If all of the above answers are "NO", then may proceed with Cephalosporin use.    Immunization History  Administered Date(s) Administered  . Influenza, High Dose Seasonal PF 07/23/2016, 06/15/2017  . Influenza, Seasonal, Injecte, Preservative Fre 07/27/2012  . Influenza,inj,Quad PF,6+ Mos 06/10/2013, 05/01/2014, 03/19/2015  . Pneumococcal Conjugate-13 10/06/2013  . Pneumococcal Polysaccharide-23 04/28/2012  . Td 08/03/2013    Family  History  Problem Relation Age of Onset  . Heart disease Mother   . Hypertension Mother   . COPD Mother        was a smoker  . Thyroid disease Mother   . Hypertension Father   . Hypertension Sister   . Thyroid disease Sister   . Hypertension Sister   . Thyroid disease Sister   . Breast cancer Maternal Aunt   . Kidney disease Neg Hx   . Bladder Cancer Neg Hx      Current Facility-Administered Medications:  .  0.9 %  sodium chloride infusion, 250 mL, Intravenous, PRN, Georgiana Shore, NP .  acetaminophen (TYLENOL) tablet 650 mg, 650 mg, Oral, Q4H PRN, Georgiana Shore, NP, 650 mg at 04/17/18 1212 .  albuterol (PROVENTIL) (2.5 MG/3ML) 0.083% nebulizer solution 2.5 mg, 2.5 mg, Nebulization, Q6H PRN, Georgiana Shore, NP .  amiodarone (PACERONE) tablet 200 mg, 200 mg, Oral, Daily, Lillia Mountain M, NP, 200 mg at 04/17/18 1213 .  apixaban (ELIQUIS) tablet 5 mg, 5 mg, Oral, BID, Georgiana Shore, NP, 5 mg at 04/17/18 1212 .  furosemide (LASIX) injection 80 mg, 80 mg, Intravenous, BID, Georgiana Shore, NP, 80 mg at 04/17/18 1212 .  gabapentin (NEURONTIN) capsule 200 mg, 200 mg, Oral, TID, Georgiana Shore, NP, 200 mg at 04/17/18 1212 .  insulin aspart (novoLOG) injection 0-5 Units, 0-5 Units, Subcutaneous, QHS, Powell, A Clint Lipps., MD .  insulin aspart (novoLOG) injection 0-9 Units, 0-9 Units, Subcutaneous, TID WC, Powell, A Clint Lipps., MD .  milrinone (PRIMACOR) 20 MG/100 ML (0.2 mg/mL) infusion, 0.125 mcg/kg/min, Intravenous, Continuous, Larey Dresser, MD, Last Rate: 3.71 mL/hr at 04/17/18 0405, 0.125 mcg/kg/min at 04/17/18 0405 .  ondansetron (ZOFRAN) injection 4 mg, 4 mg, Intravenous, Q6H PRN, Georgiana Shore, NP .  ondansetron [facility]) tablet 8 mg, 8 mg, Oral, Q8H PRN, Georgiana Shore, NP .  potassium chloride SA (K-DUR,KLOR-CON) CR tablet 20 mEq, 20 mEq, Oral, BID, Georgiana Shore, NP, 20 mEq at 04/17/18 1214 .  rosuvastatin (CRESTOR) tablet 10 mg, 10 mg, Oral, Daily, Georgiana Shore,  NP, 10 mg at 04/17/18 1215 .  sildenafil (REVATIO) tablet 20 mg, 20 mg, Oral, TID, Georgiana Shore, NP, 20 mg at 04/17/18 1212 .  sodium chloride flush (NS) 0.9 %  injection 10-40 mL, 10-40 mL, Intracatheter, Q12H, Arnoldo Lenis, MD, 10 mL at 04/17/18 1215 .  sodium chloride flush (NS) 0.9 % injection 10-40 mL, 10-40 mL, Intracatheter, PRN, Arnoldo Lenis, MD .  sodium chloride flush (NS) 0.9 % injection 3 mL, 3 mL, Intravenous, Q12H, Georgiana Shore, NP, 3 mL at 04/16/18 0840 .  sodium chloride flush (NS) 0.9 % injection 3 mL, 3 mL, Intravenous, PRN, Georgiana Shore, NP .  spironolactone (ALDACTONE) tablet 12.5 mg, 12.5 mg, Oral, Daily, Georgiana Shore, NP, 12.5 mg at 04/17/18 1227 .  vitamin B-12 (CYANOCOBALAMIN) tablet 1,000 mcg, 1,000 mcg, Oral, Daily, Elodia Florence., MD, 1,000 mcg at 04/17/18 1213    EVENTS   04/15/2018 -admit 04/17/18 - pccm consult  SUBJECTIVE/OVERNIGHT/INTERVAL HX      Objective    Vitals:   04/17/18 0722 04/17/18 0806 04/17/18 1138 04/17/18 1711  BP:  120/68 137/72 (!) 148/61  Pulse:  60 60 (!) 59  Resp:  18  20  Temp:  98.1 F (36.7 C) 98.2 F (36.8 C) 98.4 F (36.9 C)  TempSrc:  Oral Oral Oral  SpO2:  99% 100% 100%  Weight: 101.8 kg     Height:        Filed Weights   04/16/18 0554 04/16/18 1700 04/17/18 0722  Weight: 99 kg 101.3 kg 101.8 kg          Physical Exam: General Appearance:  Obese, in bed. No distress Head:  Normocephalic, without obvious abnormality, atraumatic Eyes:  PERRL - yes, conjunctiva/corneas - clear     Ears:  Normal external ear canals, both ears Nose:  G tube - no 4L [place] O2+ Throat:  ETT TUBE - no , OG tube - no Neck:  Supple,  No enlargement/tenderness/nodules Lungs: Clear to auscultation bilaterally, Ventilator   Synchrony - no Heart:  S1 and S2 normal, no murmur, CVP - no.  Pressors - no Abdomen:  Soft, no masses, no organomegaly Genitalia / Rectal:  Not done Extremities:  Extremities- LLE  Edema - chronic STIGMATA of CONNECTIVE TISSUE DISEASE  - Distal digital fissuring (ie, "mechanic hands") - no - Distal digital tip ulceration - no -Inflammatory arthritis or polyarticular morning joint stiffness ?60 minutes - no - Palmar telangiectasia - no - Raynaud phenomenon - no - Unexplained digital edema - no - Unexplained fixed rash on the digital extensor surfaces (Gottron's sign) - no ... - Deformities of RA - no - Scleroderma  - no - Malar Rash -  no  Skin:  ntact in exposed areas . Sacral area - not examoned Neurologic:  Sedation - none -> RASS - +1 . Moves all 4s - yes. CAM-ICU - neg . Orientation - x3+. Good historian    Labs   PULMONARY Recent Labs  Lab 04/17/18 0633  O2SAT 82.5    CBC Recent Labs  Lab 04/15/18 1618 04/16/18 0611 04/17/18 0456  HGB 10.6* 9.5* 9.3*  HCT 36.6 33.1* 31.5*  WBC 6.2 6.0 5.4  PLT 234 210 205    COAGULATION No results for input(s): INR in the last 168 hours.  CARDIAC  No results for input(s): TROPONINI in the last 168 hours. No results for input(s): PROBNP in the last 168 hours.   CHEMISTRY Recent Labs  Lab 04/13/18 1455 04/15/18 1618 04/16/18 0611 04/17/18 0456  NA 142 140 142 140  K 3.8 3.9 3.6 3.5  CL 92* 98 99 99  CO2 31* 32 32 31  GLUCOSE  94 89 108* 130*  BUN 46* 47* 45* 45*  CREATININE 1.85* 1.84* 1.75* 1.64*  CALCIUM 10.0 9.9 9.4 8.9  MG  --   --   --  2.2   Estimated Creatinine Clearance: 32.3 mL/min (A) (by C-G formula based on SCr of 1.64 mg/dL (H)).   LIVER Recent Labs  Lab 04/15/18 1618 04/16/18 0611  AST 27 19  ALT 17 16  ALKPHOS 106 94  BILITOT 0.7 0.7  PROT 7.2 6.5  ALBUMIN 3.7 3.3*     INFECTIOUS No results for input(s): LATICACIDVEN, PROCALCITON in the last 168 hours.   ENDOCRINE CBG (last 3)  Recent Labs    04/17/18 0802 04/17/18 1138 04/17/18 1711  GLUCAP 121* 105* 104*         IMAGING x48h  - image(s) personally visualized  -   highlighted in bold Nm  Pulmonary Perf And Vent  Result Date: 04/17/2018 CLINICAL DATA:  Chronic respiratory failure and pulmonary hypertension. EXAM: NUCLEAR MEDICINE VENTILATION - PERFUSION LUNG SCAN TECHNIQUE: Ventilation images were obtained in multiple projections using inhaled aerosol Tc-[id number]. Perfusion images were obtained in multiple projections after intravenous injection of Tc-972mAA. RADIOPHARMACEUTICALS:  31.2 mCi of Tc-9927mPA aerosol inhalation and 4.2 mCi Tc99m33m IV COMPARISON:  Chest CT 04/17/2018 FINDINGS: Ventilation: No focal ventilation defect. Perfusion: No wedge shaped peripheral perfusion defects to suggest acute pulmonary embolism. IMPRESSION: Negative V/Q scan for pulmonary embolism. Electronically Signed   By: P.  Marijo Sanes.   On: 04/17/2018 11:17   [place] Site Rite  Result Date: 04/16/2018 If Site Rite image not attached, placement could not be confirmed due to current cardiac rhythm.        Assessment & Plan:  ILD - African American Upper lobe, chronic, multi year,  Appears stable v mildly progressed on CT over several years. Associated mediastinal calcification +  Features look c/w stage 4 sarcoid. ILD questionnaire -- not eliciting etiologies c/w occupational or organic antigen exposure (hypersensitivity pneumonitis]. Amiodarone intake +  Pulmonary Hypertension - multifactorial (osa, diast chf, prior PE, clinical sarcoid, RV failure)  OSA - on CPAP QHS  Chronic hypoxemic resp failure - progressive over 15 years esp last 1-2 years  PLAN   - await thoraic radiology interpretation of HRCT for dx and progression as part of multi-disciplinary input in narrowing dx for ILD  - If ILD felt to be progressive over time +/- ESR high; cards to consider dc amiodarone  - await autoimmune/vasculitis/HP pane wit ACE level. Check lupus anticoagulant -  Continue o2 - repeat PFt - can be inaccurate - continue cpap qhs - might need abg check  - consider Right heart cath - advanced PAH [facility] based on this (hx from her that LetaGodleyped in 2016)     SIGNATURE    Dr. MuraBrand MalesD., F.C.C.P,  Pulmonary and Critical Care Medicine Staff Physician, [facility] - Interstitial Lung Disease  Program  Pulmonary [place], [id number]: [phone] no answer or between  15:00h - 7:00h: call [phone] Telephone: [phone]  6:44 PM 04/17/2018

## 2018-04-17 NOTE — Progress Notes (Signed)
Progress Note  Patient Name: Debra Saunders Date of Encounter: 04/17/2018  Primary Cardiologist: [name], MD  / [name]   Subjective   72 year old female with acute on chronic diastolic congestive heart failure. He has a history of nonobstructive coronary artery disease, atrial fibrillation, bradycardia, chronic respiratory failure on home O2, COPD, pulmonary hypertension, type 2 diabetes mellitus, hypertension.  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  She is had a history of subarachnoid hemorrhage while on Xarelto in 2014.  She is now on Eliquis for her atrial fibrillation and history of DVT.  She was admitted on October 24 with acute on chronic diastolic heart failure.  She gained 10 pounds at home despite increasing her diuretics.  She is tried metolazone without any success.  She has chronic kidney disease  She is been on IV Lasix.  Her net diuresis is 500 cc so far during this admission.  She has had a PICC line placed.  She is on milrinone.  We attempted to measure CVP measurements but currently the CVP transducer is not working.  That will be replaced shortly.  Inpatient Medications    Scheduled Meds: . amiodarone  200 mg Oral Daily  . apixaban  5 mg Oral BID  . furosemide  80 mg Intravenous BID  . gabapentin  200 mg Oral TID  . insulin aspart  0-5 Units Subcutaneous QHS  . insulin aspart  0-9 Units Subcutaneous TID WC  . potassium chloride SA  20 mEq Oral BID  . rosuvastatin  10 mg Oral Daily  . sildenafil  20 mg Oral TID  . sodium chloride flush  10-40 mL Intracatheter Q12H  . sodium chloride flush  3 mL Intravenous Q12H  . spironolactone  12.5 mg Oral Daily  . vitamin B-12  1,000 mcg Oral Daily   Continuous Infusions: . sodium chloride    . milrinone 0.125 mcg/kg/min (04/17/18 0405)   PRN Meds: sodium chloride, acetaminophen, albuterol, ondansetron (ZOFRAN) IV, ondansetron, sodium chloride flush, sodium chloride flush   Vital  Signs    Vitals:   04/16/18 2345 04/17/18 0025 04/17/18 0429 04/17/18 0722  BP:  114/62 120/66   Pulse:  62 60   Resp:   15   Temp:      TempSrc:      SpO2: 98% 100% 100%   Weight:    101.8 kg  Height:        Intake/Output Summary (Last 24 hours) at 04/17/2018 0731 Last data filed at 04/17/2018 0405 Gross per 24 hour  Intake 636.28 ml  Output 1000 ml  Net -363.72 ml   Filed Weights   04/16/18 0554 04/16/18 1700 04/17/18 0722  Weight: 99 kg 101.3 kg 101.8 kg    Telemetry     NSR with demand AV pacing   - Personally Reviewed  ECG      - Personally Reviewed  Physical Exam   GEN:  Moderately obese, elderly female, no acute distress Neck: No JVD Cardiac: RRR, soft systolic murmur Respiratory: Clear to auscultation bilaterally. GI: Soft, nontender, non-distended  MS: No edema; No deformity. Neuro:  Nonfocal  Psych: Normal affect   Labs    Chemistry Recent Labs  Lab 04/15/18 1618 04/16/18 0611 04/17/18 0456  NA 140 142 140  K 3.9 3.6 3.5  CL 98 99 99  CO2 32 32 31  GLUCOSE 89 108* 130*  BUN 47* 45* 45*  CREATININE 1.84* 1.75* 1.64*  CALCIUM 9.9 9.4 8.9  PROT 7.2 6.5  --   ALBUMIN 3.7 3.3*  --   AST 27 19  --   ALT 17 16  --   ALKPHOS 106 94  --   BILITOT 0.7 0.7  --   GFRNONAA 26* 28* 30*  GFRAA 30* 32* 34*  ANIONGAP 10 11 10      Hematology Recent Labs  Lab 04/15/18 1618 04/16/18 0611  WBC 6.2 6.0  RBC 4.09 3.71*  HGB 10.6* 9.5*  HCT 36.6 33.1*  MCV 89.5 89.2  MCH 25.9* 25.6*  MCHC 29.0* 28.7*  RDW 13.1 13.2  PLT 234 210    Cardiac EnzymesNo results for input(s): TROPONINI in the last 168 hours.  Recent Labs  Lab 04/15/18 1629  TROPIPOC 0.02     BNP Recent Labs  Lab 04/15/18 1618  BNP 35.8     DDimer No results for input(s): DDIMER in the last 168 hours.   Radiology    Dg Chest 2 View  Result Date: 04/15/2018 CLINICAL DATA:  Acute shortness of breath for 2 days. EXAM: CHEST - 2 VIEW COMPARISON:  03/08/2018 FINDINGS:  Cardiomegaly and LEFT-sided pacemaker again noted. RIGHT hemidiaphragm elevation is identified. Pulmonary vascular congestion noted with interstitial and patchy airspace opacities. No pleural effusion or pneumothorax. No acute bony abnormality identified. IMPRESSION: Cardiomegaly with pulmonary vascular congestion with interstitial and patchy airspace opacities. These opacities are favored to represent edema but infection/pneumonia is not excluded. Electronically Signed   By: Margarette Canada M.D.   On: 04/15/2018 17:04   [place] Ekg Site Rite  Result Date: 04/16/2018 If Site Rite image not attached, placement could not be confirmed due to current cardiac rhythm.   Cardiac Studies     Patient Profile     72 y.o. female with acute on chronic diastolic congestive heart failure.  PICC line has been placed.  Is currently on milrinone  Assessment & Plan    1.  Acute on chronic diastolic congestive heart failure: [name] has been placed on milrinone.  We have been able to diurese her 500 cc.  She has a PICC line in place and we are attempting to measure CVP readings but the transducer is currently not working.  That will be treated out this morning.  2.  Essential hypertension: Currently stable.  Continue current medications.  2.  History of atrial fibrillation: She is currently sinus rhythm and AV pacing.  Continue Eliquis.  To new current medications.      For questions or updates, please contact [place] Please consult [email or web address] for contact info under        Signed, Mertie Moores, MD  04/17/2018, 7:31 AM

## 2018-04-17 NOTE — Progress Notes (Signed)
Called to replace cvp line due to line broke.

## 2018-04-18 LAB — HEMOGLOBIN AND HEMATOCRIT, BLOOD
HCT: 27.1 % — ABNORMAL LOW (ref 36.0–46.0)
Hemoglobin: 7.8 g/dL — ABNORMAL LOW (ref 12.0–15.0)

## 2018-04-18 LAB — BASIC METABOLIC PANEL
Anion gap: 7 (ref 5–15)
BUN: 33 mg/dL — ABNORMAL HIGH (ref 8–23)
CO2: 32 mmol/L (ref 22–32)
Calcium: 8.7 mg/dL — ABNORMAL LOW (ref 8.9–10.3)
Chloride: 102 mmol/L (ref 98–111)
Creatinine, Ser: 1.25 mg/dL — ABNORMAL HIGH (ref 0.44–1.00)
GFR calc Af Amer: 48 mL/min — ABNORMAL LOW (ref >60)
GFR calc non Af Amer: 41 mL/min — ABNORMAL LOW (ref >60)
Glucose, Bld: 136 mg/dL — ABNORMAL HIGH (ref 70–99)
Potassium: 3.4 mmol/L — ABNORMAL LOW (ref 3.5–5.1)
Sodium: 141 mmol/L (ref 135–145)

## 2018-04-18 LAB — ALDOLASE: Aldolase: 5.5 U/L (ref 3.3–10.3)

## 2018-04-18 LAB — CBC
HCT: 29.5 % — ABNORMAL LOW (ref 36.0–46.0)
Hemoglobin: 8.2 g/dL — ABNORMAL LOW (ref 12.0–15.0)
MCH: 25.4 pg — ABNORMAL LOW (ref 26.0–34.0)
MCHC: 27.8 g/dL — ABNORMAL LOW (ref 30.0–36.0)
MCV: 91.3 fL (ref 80.0–100.0)
Platelets: 196 10*3/uL (ref 150–400)
RBC: 3.23 MIL/uL — ABNORMAL LOW (ref 3.87–5.11)
RDW: 13.2 % (ref 11.5–15.5)
WBC: 5.3 10*3/uL (ref 4.0–10.5)
nRBC: 0 % (ref 0.0–0.2)

## 2018-04-18 LAB — COOXEMETRY PANEL
Carboxyhemoglobin: 1.5 % (ref 0.5–1.5)
Methemoglobin: 1.6 % — ABNORMAL HIGH (ref 0.0–1.5)
O2 Saturation: 80.1 %
Total hemoglobin: 9 g/dL — ABNORMAL LOW (ref 12.0–16.0)

## 2018-04-18 LAB — MAGNESIUM: Magnesium: 2.3 mg/dL (ref 1.7–2.4)

## 2018-04-18 LAB — GLUCOSE, CAPILLARY
Glucose-Capillary: 117 mg/dL — ABNORMAL HIGH (ref 70–99)
Glucose-Capillary: 151 mg/dL — ABNORMAL HIGH (ref 70–99)
Glucose-Capillary: 97 mg/dL (ref 70–99)

## 2018-04-18 LAB — ANGIOTENSIN CONVERTING ENZYME: Angiotensin-Converting Enzyme: 28 U/L (ref 14–82)

## 2018-04-18 LAB — RHEUMATOID FACTOR: Rhuematoid fact SerPl-aCnc: 16 IU/mL — ABNORMAL HIGH (ref 0.0–13.9)

## 2018-04-18 MED ORDER — SODIUM CHLORIDE 0.9 % IV SOLN: 250.0000 mL | INTRAVENOUS | Status: DC | PRN

## 2018-04-18 MED ORDER — SODIUM CHLORIDE 0.9% FLUSH: 3.0000 mL | INTRAVENOUS | Status: DC | PRN

## 2018-04-18 MED ORDER — SODIUM CHLORIDE 0.9 % IV SOLN: INTRAVENOUS | Status: DC

## 2018-04-18 MED ORDER — SODIUM CHLORIDE 0.9% FLUSH
3.0000 mL | Freq: Two times a day (BID) | INTRAVENOUS | Status: DC
  Administered 2018-04-19: 3 mL via INTRAVENOUS

## 2018-04-18 MED ORDER — BUTALBITAL-APAP-CAFFEINE 50-325-40 MG PO TABS
1.0000 | ORAL_TABLET | Freq: Once | ORAL | Status: AC | PRN
  Administered 2018-04-18: 1 via ORAL
  Filled 2018-04-18: qty 1

## 2018-04-18 MED ORDER — POTASSIUM CHLORIDE CRYS ER 20 MEQ PO TBCR
40.0000 meq | EXTENDED_RELEASE_TABLET | Freq: Once | ORAL | Status: AC
  Administered 2018-04-18: 40 meq via ORAL
  Filled 2018-04-18: qty 2

## 2018-04-18 NOTE — Progress Notes (Signed)
Patient ID: Debra Saunders, female   DOB: [birth year], 72 y.o.   MRN: [id number]     Advanced Heart Failure Rounding Note  PCP-Cardiologist: [name], MD   Subjective:    Net negative 1610 cc yesterday, weight does not reflect.  Patient says that breathing is better and swelling is going down. Creatinine improved at 1.25.  CVP today 8-9, co-ox 80%.  She remains on milrinone 0.125.   V/Q scan: No evidence for chronic PE.   High resolution CT chest done, report pending.   Objective:   Weight Range: 102.1 kg Body mass index is 43.96 kg/m.   Vital Signs:   Temp:  [97.8 F (36.6 C)-98.4 F (36.9 C)] 98.4 F (36.9 C) (10/27 0843) Pulse Rate:  [58-63] 62 (10/27 1142) Resp:  [13-20] 20 (10/27 1142) BP: (106-148)/(51-61) 128/61 (10/27 1142) SpO2:  [94 %-100 %] 100 % (10/27 1142) Weight:  [102.1 kg] 102.1 kg (10/27 0513) Last BM Date: 04/16/15  Weight change: Filed Weights   04/16/18 1700 04/17/18 0722 04/18/18 0513  Weight: 101.3 kg 101.8 kg 102.1 kg    Intake/Output:   Intake/Output Summary (Last 24 hours) at 04/18/2018 1257 Last data filed at 04/18/2018 0900 Gross per 24 hour  Intake 490 ml  Output 1900 ml  Net -1410 ml      Physical Exam    General:  Well appearing. No resp difficulty HEENT: Normal Neck: Supple. JVP 10 cm. Carotids 2+ bilat; no bruits. No lymphadenopathy or thyromegaly appreciated. Cor: PMI nondisplaced. Regular rate & rhythm. No rubs, gallops or murmurs. Lungs: Clear Abdomen: Soft, nontender, nondistended. No hepatosplenomegaly. No bruits or masses. Good bowel sounds. Extremities: No cyanosis, clubbing, rash. Left leg chronically larger than right.  Neuro: Alert & orientedx3, cranial nerves grossly intact. moves all 4 extremities w/o difficulty. Affect pleasant   Telemetry   A-paced, personally reviewed.   Labs    CBC Recent Labs    04/16/18 0611 04/17/18 0456 04/18/18 0441  WBC 6.0 5.4 5.3  NEUTROABS 3.3  --   --   HGB 9.5*  9.3* 8.2*  HCT 33.1* 31.5* 29.5*  MCV 89.2 88.2 91.3  PLT 210 205 417   Basic Metabolic Panel Recent Labs    04/17/18 0456 04/18/18 0441  NA 140 141  K 3.5 3.4*  CL 99 102  CO2 31 32  GLUCOSE 130* 136*  BUN 45* 33*  CREATININE 1.64* 1.25*  CALCIUM 8.9 8.7*  MG 2.2 2.3   Liver Function Tests Recent Labs    04/15/18 1618 04/16/18 0611  AST 27 19  ALT 17 16  ALKPHOS 106 94  BILITOT 0.7 0.7  PROT 7.2 6.5  ALBUMIN 3.7 3.3*   No results for input(s): LIPASE, AMYLASE in the last 72 hours. Cardiac Enzymes Recent Labs    04/17/18 0456  CKTOTAL 72  CKMB 1.2    BNP: BNP (last 3 results) Recent Labs    10/28/17 0350 11/05/17 1514 04/15/18 1618  BNP 290.0* 989.0* 35.8    ProBNP (last 3 results) No results for input(s): PROBNP in the last 8760 hours.   D-Dimer No results for input(s): DDIMER in the last 72 hours. Hemoglobin A1C Recent Labs    04/16/18 1821  HGBA1C 5.8*   Fasting Lipid Panel No results for input(s): CHOL, HDL, LDLCALC, TRIG, CHOLHDL, LDLDIRECT in the last 72 hours. Thyroid Function Tests Recent Labs    04/17/18 0456  TSH 0.808    Other results:   Imaging  No results found.   Medications:     Scheduled Medications: . amiodarone  200 mg Oral Daily  . furosemide  80 mg Intravenous BID  . gabapentin  200 mg Oral TID  . insulin aspart  0-5 Units Subcutaneous QHS  . insulin aspart  0-9 Units Subcutaneous TID WC  . potassium chloride SA  20 mEq Oral BID  . rosuvastatin  10 mg Oral Daily  . sildenafil  20 mg Oral TID  . sodium chloride flush  10-40 mL Intracatheter Q12H  . sodium chloride flush  3 mL Intravenous Q12H  . spironolactone  12.5 mg Oral Daily  . vitamin B-12  1,000 mcg Oral Daily     Infusions: . sodium chloride    . milrinone 0.125 mcg/kg/min (04/17/18 1821)     PRN Medications:  sodium chloride, acetaminophen, albuterol, ondansetron (ZOFRAN) IV, ondansetron, sodium chloride flush, sodium chloride  flush    Patient Profile   Debra Saunders is a 72 y.o. 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  Admitted with A/C diastolic HF with poor response to increased diuretics outpatient.   Assessment/Plan   1. 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  Echo this admission showed EF 60-65% with moderate LVH, RV was difficult to see.  Based on cath from 4/19, severe primarily pulmonary artery HTN with PVR 7.2 WU.  She has been on torsemide 40 mg po bid at home with occasional doses of metolazone but was failing home management.  Creatinine up to 1.75 this admission. I started her on milrinone to support RV and have been diuresing with IV Lasix.  Good co-ox today and creatinine down to 1.25.  CVP 8-9.   - Will give Lasix 80 mg IV bid one more day, back to po tomorrow.  - Will stop milrinone in am tomorrow, then plan RHC.  2. Pulmonary hypertension: She was followed in the past at [facility].  Based on 4/19 cath, she has severe PAH with PVR 7.2 WU.  She has been on sildenafil 20 mg tid x 4 years. At one point, she was on ERA, thought this helped.  Chronic hypoxemic respiratory failure, on home oxygen.  Prior CTA chest from 2/19 showed no PE but there was evidence for interstitial fibrosis with mild bronchiectasis.  She was not a heavy smoker.  She also has a history of remote DVT (2004) but V/Q scan negative for evidence of chronic PE.  Dr. Chase Caller has seen, concerned for possible burnt-out sarcoidosis as cause of her interstitial fibrosis and hilar adenopathy.  She has family history of this.  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  ACE not elevated, ESR only 32.  She has  been on amiodarone long-term => must consider amiodarone toxicity as well as cause of ILD.  ESR not high.  - Pending read on high resolution CT chest to assess ILD.  Will await pulmonary advice regarding amiodarone based on CT read.   - She will need eventual repeat RHC after diuresis => think tomorrow reasonable, will discuss risks/benefits with patient.   - Continue sildenafil, may add ERA based on RHC. [name]  3. AKI on CKD stage 3: Concern for cardiorenal syndrome.  As above, will try on milrinone to support RV. Improved on milrinone.  4. Atrial fibrillation: She is a-paced.  She has been on apixaban (switched from Xarelto after 2014 SAH) and amiodarone.   - Hold Eliquis for cath tomorrow.  5. OSA: Continue CPAP.   Length of Stay: 3  [name], MD  04/18/2018, 12:57 PM  Advanced Heart Failure Team Pager [phone] (M-F; 7a - 4p)  Please contact [facility] for night-coverage after hours (4p -7a ) and weekends on [email or web address]

## 2018-04-18 NOTE — H&P (View-Only) (Signed)
Patient ID: Debra Saunders, female   DOB: [birth year], 72 y.o.   MRN: [id number]     Advanced Heart Failure Rounding Note  PCP-Cardiologist: [name], MD   Subjective:    Net negative 1610 cc yesterday, weight does not reflect.  Patient says that breathing is better and swelling is going down. Creatinine improved at 1.25.  CVP today 8-9, co-ox 80%.  She remains on milrinone 0.125.   V/Q scan: No evidence for chronic PE.   High resolution CT chest done, report pending.   Objective:   Weight Range: 102.1 kg Body mass index is 43.96 kg/m.   Vital Signs:   Temp:  [97.8 F (36.6 C)-98.4 F (36.9 C)] 98.4 F (36.9 C) (10/27 0843) Pulse Rate:  [58-63] 62 (10/27 1142) Resp:  [13-20] 20 (10/27 1142) BP: (106-148)/(51-61) 128/61 (10/27 1142) SpO2:  [94 %-100 %] 100 % (10/27 1142) Weight:  [102.1 kg] 102.1 kg (10/27 0513) Last BM Date: 04/16/15  Weight change: Filed Weights   04/16/18 1700 04/17/18 0722 04/18/18 0513  Weight: 101.3 kg 101.8 kg 102.1 kg    Intake/Output:   Intake/Output Summary (Last 24 hours) at 04/18/2018 1257 Last data filed at 04/18/2018 0900 Gross per 24 hour  Intake 490 ml  Output 1900 ml  Net -1410 ml      Physical Exam    General:  Well appearing. No resp difficulty HEENT: Normal Neck: Supple. JVP 10 cm. Carotids 2+ bilat; no bruits. No lymphadenopathy or thyromegaly appreciated. Cor: PMI nondisplaced. Regular rate & rhythm. No rubs, gallops or murmurs. Lungs: Clear Abdomen: Soft, nontender, nondistended. No hepatosplenomegaly. No bruits or masses. Good bowel sounds. Extremities: No cyanosis, clubbing, rash. Left leg chronically larger than right.  Neuro: Alert & orientedx3, cranial nerves grossly intact. moves all 4 extremities w/o difficulty. Affect pleasant   Telemetry   A-paced, personally reviewed.   Labs    CBC Recent Labs    04/16/18 0611 04/17/18 0456 04/18/18 0441  WBC 6.0 5.4 5.3  NEUTROABS 3.3  --   --   HGB 9.5*  9.3* 8.2*  HCT 33.1* 31.5* 29.5*  MCV 89.2 88.2 91.3  PLT 210 205 417   Basic Metabolic Panel Recent Labs    04/17/18 0456 04/18/18 0441  NA 140 141  K 3.5 3.4*  CL 99 102  CO2 31 32  GLUCOSE 130* 136*  BUN 45* 33*  CREATININE 1.64* 1.25*  CALCIUM 8.9 8.7*  MG 2.2 2.3   Liver Function Tests Recent Labs    04/15/18 1618 04/16/18 0611  AST 27 19  ALT 17 16  ALKPHOS 106 94  BILITOT 0.7 0.7  PROT 7.2 6.5  ALBUMIN 3.7 3.3*   No results for input(s): LIPASE, AMYLASE in the last 72 hours. Cardiac Enzymes Recent Labs    04/17/18 0456  CKTOTAL 72  CKMB 1.2    BNP: BNP (last 3 results) Recent Labs    10/28/17 0350 11/05/17 1514 04/15/18 1618  BNP 290.0* 989.0* 35.8    ProBNP (last 3 results) No results for input(s): PROBNP in the last 8760 hours.   D-Dimer No results for input(s): DDIMER in the last 72 hours. Hemoglobin A1C Recent Labs    04/16/18 1821  HGBA1C 5.8*   Fasting Lipid Panel No results for input(s): CHOL, HDL, LDLCALC, TRIG, CHOLHDL, LDLDIRECT in the last 72 hours. Thyroid Function Tests Recent Labs    04/17/18 0456  TSH 0.808    Other results:   Imaging  No results found.   Medications:     Scheduled Medications: . amiodarone  200 mg Oral Daily  . furosemide  80 mg Intravenous BID  . gabapentin  200 mg Oral TID  . insulin aspart  0-5 Units Subcutaneous QHS  . insulin aspart  0-9 Units Subcutaneous TID WC  . potassium chloride SA  20 mEq Oral BID  . rosuvastatin  10 mg Oral Daily  . sildenafil  20 mg Oral TID  . sodium chloride flush  10-40 mL Intracatheter Q12H  . sodium chloride flush  3 mL Intravenous Q12H  . spironolactone  12.5 mg Oral Daily  . vitamin B-12  1,000 mcg Oral Daily     Infusions: . sodium chloride    . milrinone 0.125 mcg/kg/min (04/17/18 1821)     PRN Medications:  sodium chloride, acetaminophen, albuterol, ondansetron (ZOFRAN) IV, ondansetron, sodium chloride flush, sodium chloride  flush    Patient Profile   Debra Saunders is a 72 y.o. 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  Admitted with A/C diastolic HF with poor response to increased diuretics outpatient.   Assessment/Plan   1. 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  Echo this admission showed EF 60-65% with moderate LVH, RV was difficult to see.  Based on cath from 4/19, severe primarily pulmonary artery HTN with PVR 7.2 WU.  She has been on torsemide 40 mg po bid at home with occasional doses of metolazone but was failing home management.  Creatinine up to 1.75 this admission. I started her on milrinone to support RV and have been diuresing with IV Lasix.  Good co-ox today and creatinine down to 1.25.  CVP 8-9.   - Will give Lasix 80 mg IV bid one more day, back to po tomorrow.  - Will stop milrinone in am tomorrow, then plan RHC.  2. Pulmonary hypertension: She was followed in the past at [facility].  Based on 4/19 cath, she has severe PAH with PVR 7.2 WU.  She has been on sildenafil 20 mg tid x 4 years. At one point, she was on ERA, thought this helped.  Chronic hypoxemic respiratory failure, on home oxygen.  Prior CTA chest from 2/19 showed no PE but there was evidence for interstitial fibrosis with mild bronchiectasis.  She was not a heavy smoker.  She also has a history of remote DVT (2004) but V/Q scan negative for evidence of chronic PE.  Dr. Chase Caller has seen, concerned for possible burnt-out sarcoidosis as cause of her interstitial fibrosis and hilar adenopathy.  She has family history of this.  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  ACE not elevated, ESR only 32.  She has  been on amiodarone long-term => must consider amiodarone toxicity as well as cause of ILD.  ESR not high.  - Pending read on high resolution CT chest to assess ILD.  Will await pulmonary advice regarding amiodarone based on CT read.   - She will need eventual repeat RHC after diuresis => think tomorrow reasonable, will discuss risks/benefits with patient.   - Continue sildenafil, may add ERA based on RHC. [name]  3. AKI on CKD stage 3: Concern for cardiorenal syndrome.  As above, will try on milrinone to support RV. Improved on milrinone.  4. Atrial fibrillation: She is a-paced.  She has been on apixaban (switched from Xarelto after 2014 SAH) and amiodarone.   - Hold Eliquis for cath tomorrow.  5. OSA: Continue CPAP.   Length of Stay: 3  [name], MD  04/18/2018, 12:57 PM  Advanced Heart Failure Team Pager [phone] (M-F; 7a - 4p)  Please contact [facility] for night-coverage after hours (4p -7a ) and weekends on [email or web address]

## 2018-04-18 NOTE — Progress Notes (Addendum)
PROGRESS NOTE    Debra Saunders  TMH:[id number] DOB: [birth year] DOA: 04/15/2018 PCP: [name], MD   Brief Narrative: Debra Saunders is Debra Saunders 72 y.o. female with medical history significant of diastolic dysfunction CHF, atrial fibrillation on chronic Eliquis, history of cardiorenal syndrome requiring hemodialysis in the past, COPD, hypertension, morbid obesity, diabetes, hyperlipidemia and previous CVA who came to the ER complaining of shortness of breath and lower extremity edema.  Patient has been dealing with excess fluid with worsening edema just last week.  She was seen by her cardiologist who increased her oral diuretic dose Dayla Gasca week ago.  Patient did not get adequate urinary response and did not have any decrease in her fluid status.  She has gained significant weight since then.  She is on home O2 at 4 L and has been requiring even more.  She called her cardiologist today who recommended hospital admissions with IV diuretics.  Patient's creatinine has slightly worsened over her baseline.  With her history of previous cardiorenal syndrome and failing to respond to oral diuretics she is being admitted for further work-up..  Assessment & Plan:   Principal Problem:   acute on chronic diastolic CHF (congestive heart failure) (HCC) Active Problems:   Hypertension   Pulmonary hypertension, unspecified (HCC)   Hyperlipidemia   Hypothyroidism   Chronic respiratory failure with hypoxia (HCC)   Atrial fibrillation (HCC)   HFpEF Exacerbation: Echo from 02/2018 with normal EF, grade 1 diastolic dysfunction.  Increased PASP.  Patient has failed outpatient diuretic therapy.   Cardiology c/s appreciate recs -> placing PICC, following CVP, starting milrinone and lasix 80 BID.   Spiro 12.5 mg daily.  Holding arb and betablocker. Planning to stop milrinone in AM and transition to PO lasix  Pulmonary Hypertension:   Continue sildenafil for [facility] High res chest CT pending V/Q scan negative for  PE Planning for RHC tomorrow Cardiolgy c/s appreciate recs, may add ERA based on this.  Decision regarding amiodarone pending high res CT Pulm c/s, appreciate recs - follow autoimmune, vasculitis, HP panels, lupus anticoagulant.  Repeat PFTs'.    acute on chronic kidney disease stage III: Patient's baseline creatinine in September was 1.1.  Improved to 1.25 today (peaked to 1.84 during this admission). Follow with diuresis  hypertension: holding arb and bb, continue spironolactone   atrial fibrillation  S/p pacemaker: Holding eliquis for RHC tomorrow.  Continue amiodarone for now.  chronic respiratory failure: On home O2, baseline 4 L [place].  Continue oxygen by nasal cannula  Anemia: b12 deficiency, now on oral as noted below.  Hb has been downtrending down since admission.  No evidence of bleeding, on discussion with nurse, she had brown normal stool.  Will continue to trend.  Follow CBC with diff tomorrow.  Follow hemoccult.      hypothyroidism: Continue levothyroxine.  morbid obesity: Dietary counseling.  B12 deficiency: follow IF ab (pending), start vitamin B12  Headache: seems like tension HA in description, continue APAP prn.  Try fioricet.   Hx DVT s/p IVC filter  Hx CVA  DVT prophylaxis: eliquis Code Status: full  Family Communication: none at bedside Disposition Plan: pending improvement   Consultants:   Cardiology  pulm   Procedures:  Echo Study Conclusions  - Left ventricle: The cavity size was normal. Wall thickness was   increased in Tiago Humphrey pattern of moderate LVH. Systolic function was   normal. The estimated ejection fraction was in the range of 60%   to 65%. Wall  motion was normal; there were no regional wall   motion abnormalities. Left ventricular diastolic function   parameters were normal. - Aortic valve: Sclerosis without stenosis. - Mitral valve: There was mild regurgitation. - Left atrium: The atrium was moderately dilated. - Pulmonary  arteries: PA peak pressure: 52 mm Hg (S).   Antimicrobials:  Anti-infectives (From admission, onward)   None      Subjective: Feeling slightly better. No complaints today. LLE improved.  Objective: Vitals:   04/18/18 0018 04/18/18 0513 04/18/18 0843 04/18/18 1142  BP: (!) 120/57 (!) 124/54 120/61 128/61  Pulse: (!) 59 (!) 58 (!) 59 62  Resp: 13 13 20 20   Temp:  97.8 F (36.6 C) 98.4 F (36.9 C)   TempSrc:  Oral Oral   SpO2: 94% 98% 99% 100%  Weight:  102.1 kg    Height:        Intake/Output Summary (Last 24 hours) at 04/18/2018 1708 Last data filed at 04/18/2018 1200 Gross per 24 hour  Intake 730 ml  Output 1800 ml  Net -1070 ml   Filed Weights   04/16/18 1700 04/17/18 0722 04/18/18 0513  Weight: 101.3 kg 101.8 kg 102.1 kg    Examination:  General: No acute distress. Cardiovascular: Heart sounds show Jerre Vandrunen regular rate, and rhythm. Lungs: Clear to auscultation bilaterally with good air movement Abdomen: Soft, nontender, nondistended Neurological: Alert and oriented 3. Moves all extremities 4. Cranial nerves II through XII grossly intact. Skin: Warm and dry. No rashes or lesions. Extremities: No clubbing or cyanosis. L>R LE edema Psychiatric: Mood and affect are normal. Insight and judgment are appropriate.    Data Reviewed: I have personally reviewed following labs and imaging studies  CBC: Recent Labs  Lab 04/15/18 1618 04/16/18 0611 04/17/18 0456 04/18/18 0441 04/18/18 1519  WBC 6.2 6.0 5.4 5.3  --   NEUTROABS  --  3.3  --   --   --   HGB 10.6* 9.5* 9.3* 8.2* 7.8*  HCT 36.6 33.1* 31.5* 29.5* 27.1*  MCV 89.5 89.2 88.2 91.3  --   PLT 234 210 205 196  --    Basic Metabolic Panel: Recent Labs  Lab 04/13/18 1455 04/15/18 1618 04/16/18 0611 04/17/18 0456 04/18/18 0441  NA 142 140 142 140 141  K 3.8 3.9 3.6 3.5 3.4*  CL 92* 98 99 99 102  CO2 31* 32 32 31 32  GLUCOSE 94 89 108* 130* 136*  BUN 46* 47* 45* 45* 33*  CREATININE 1.85* 1.84*  1.75* 1.64* 1.25*  CALCIUM 10.0 9.9 9.4 8.9 8.7*  MG  --   --   --  2.2 2.3   GFR: Estimated Creatinine Clearance: 42.4 mL/min (Denson Niccoli) (by C-G formula based on SCr of 1.25 mg/dL (H)). Liver Function Tests: Recent Labs  Lab 04/15/18 1618 04/16/18 0611  AST 27 19  ALT 17 16  ALKPHOS 106 94  BILITOT 0.7 0.7  PROT 7.2 6.5  ALBUMIN 3.7 3.3*   No results for input(s): LIPASE, AMYLASE in the last 168 hours. No results for input(s): AMMONIA in the last 168 hours. Coagulation Profile: No results for input(s): INR, PROTIME in the last 168 hours. Cardiac Enzymes: Recent Labs  Lab 04/17/18 0456  CKTOTAL 72  CKMB 1.2   BNP (last 3 results) No results for input(s): PROBNP in the last 8760 hours. HbA1C: Recent Labs    04/16/18 1821  HGBA1C 5.8*   CBG: Recent Labs  Lab 04/17/18 1138 04/17/18 1711 04/17/18 2119 04/18/18 0840 04/18/18  1142  GLUCAP 105* 104* 134* 117* 151*   Lipid Profile: No results for input(s): CHOL, HDL, LDLCALC, TRIG, CHOLHDL, LDLDIRECT in the last 72 hours. Thyroid Function Tests: Recent Labs    04/17/18 0456  TSH 0.808   Anemia Panel: Recent Labs    04/17/18 0456  VITAMINB12 121*  FOLATE 7.8  FERRITIN 49  TIBC 461*  IRON 76  RETICCTPCT 1.5   Sepsis Labs: No results for input(s): PROCALCITON, LATICACIDVEN in the last 168 hours.  Recent Results (from the past 240 hour(s))  MRSA PCR Screening     Status: None   Collection Time: 04/15/18 11:23 PM  Result Value Ref Range Status   MRSA by PCR NEGATIVE NEGATIVE Final    Comment:        [name] (FDA approved for NASAL specimens only), is one component of Olon Russ comprehensive MRSA colonization surveillance program. It is not intended to diagnose MRSA infection nor to guide or monitor treatment for MRSA infections. Performed at [facility], [place] [address], [place], [place] [address]          Radiology Studies: Nm Pulmonary Perf And Vent  Result Date:  04/17/2018 CLINICAL DATA:  Chronic respiratory failure and pulmonary hypertension. EXAM: NUCLEAR MEDICINE VENTILATION - PERFUSION LUNG SCAN TECHNIQUE: Ventilation images were obtained in multiple projections using inhaled aerosol Tc-76m DTPA. Perfusion images were obtained in multiple projections after intravenous injection of Tc-16m-MAA. RADIOPHARMACEUTICALS:  31.2 mCi of Tc-83m DTPA aerosol inhalation and 4.2 mCi Tc40m-MAA IV COMPARISON:  Chest CT 04/17/2018 FINDINGS: Ventilation: No focal ventilation defect. Perfusion: No wedge shaped peripheral perfusion defects to suggest acute pulmonary embolism. IMPRESSION: Negative V/Q scan for pulmonary embolism. Electronically Signed   By: Marijo Sanes M.D.   On: 04/17/2018 11:17        Scheduled Meds: . amiodarone  200 mg Oral Daily  . furosemide  80 mg Intravenous BID  . gabapentin  200 mg Oral TID  . insulin aspart  0-5 Units Subcutaneous QHS  . insulin aspart  0-9 Units Subcutaneous TID WC  . potassium chloride SA  20 mEq Oral BID  . rosuvastatin  10 mg Oral Daily  . sildenafil  20 mg Oral TID  . sodium chloride flush  10-40 mL Intracatheter Q12H  . sodium chloride flush  3 mL Intravenous Q12H  . spironolactone  12.5 mg Oral Daily  . vitamin B-12  1,000 mcg Oral Daily   Continuous Infusions: . sodium chloride    . milrinone 0.125 mcg/kg/min (04/17/18 1821)     LOS: 3 days    Time spent: over 30 min    Fayrene Helper, MD [facility] Pager [phone]   If 7PM-7AM, please contact night-coverage [email or web address] Password [email or web address] 04/18/2018, 5:08 PM

## 2018-04-19 ENCOUNTER — Inpatient Hospital Stay (HOSPITAL_COMMUNITY)
Admission: EM | Disposition: A | Discharge status: Home Health | Payer: Self-pay | Source: Home / Self Care | Attending: Family Medicine

## 2018-04-19 ENCOUNTER — Encounter (HOSPITAL_COMMUNITY): Payer: Self-pay | Admitting: Cardiology

## 2018-04-19 DIAGNOSIS — J9611 Chronic respiratory failure with hypoxia: Secondary | ICD-10-CM

## 2018-04-19 DIAGNOSIS — I272 Pulmonary hypertension, unspecified: Secondary | ICD-10-CM

## 2018-04-19 HISTORY — PX: RIGHT HEART CATH: CATH118263

## 2018-04-19 LAB — CBC WITH DIFFERENTIAL/PLATELET
Abs Immature Granulocytes: 0.01 10*3/uL (ref 0.00–0.07)
Basophils Absolute: 0 10*3/uL (ref 0.0–0.1)
Basophils Relative: 0 %
Eosinophils Absolute: 0.2 10*3/uL (ref 0.0–0.5)
Eosinophils Relative: 4 %
HCT: 31.1 % — ABNORMAL LOW (ref 36.0–46.0)
Hemoglobin: 8.9 g/dL — ABNORMAL LOW (ref 12.0–15.0)
Immature Granulocytes: 0 %
Lymphocytes Relative: 31 %
Lymphs Abs: 1.7 10*3/uL (ref 0.7–4.0)
MCH: 26.1 pg (ref 26.0–34.0)
MCHC: 28.6 g/dL — ABNORMAL LOW (ref 30.0–36.0)
MCV: 91.2 fL (ref 80.0–100.0)
Monocytes Absolute: 0.5 10*3/uL (ref 0.1–1.0)
Monocytes Relative: 9 %
Neutro Abs: 2.9 10*3/uL (ref 1.7–7.7)
Neutrophils Relative %: 56 %
Platelets: 191 10*3/uL (ref 150–400)
RBC: 3.41 MIL/uL — ABNORMAL LOW (ref 3.87–5.11)
RDW: 13.1 % (ref 11.5–15.5)
WBC: 5.3 10*3/uL (ref 4.0–10.5)
nRBC: 0 % (ref 0.0–0.2)

## 2018-04-19 LAB — MPO/PR-3 (ANCA) ANTIBODIES
ANCA Proteinase 3: <3.5 U/mL (ref 0.0–3.5)
Myeloperoxidase Abs: <9 U/mL (ref 0.0–9.0)

## 2018-04-19 LAB — POCT I-STAT 3, VENOUS BLOOD GAS (G3P V)
Acid-Base Excess: 5 mmol/L — ABNORMAL HIGH (ref 0.0–2.0)
Acid-Base Excess: 6 mmol/L — ABNORMAL HIGH (ref 0.0–2.0)
Bicarbonate: 32.1 mmol/L — ABNORMAL HIGH (ref 20.0–28.0)
Bicarbonate: 33.3 mmol/L — ABNORMAL HIGH (ref 20.0–28.0)
O2 Saturation: 63 %
O2 Saturation: 66 %
TCO2: 34 mmol/L — ABNORMAL HIGH (ref 22–32)
TCO2: 35 mmol/L — ABNORMAL HIGH (ref 22–32)
pCO2, Ven: 59.1 mmHg (ref 44.0–60.0)
pCO2, Ven: 60.8 mmHg — ABNORMAL HIGH (ref 44.0–60.0)
pH, Ven: 7.342 (ref 7.250–7.430)
pH, Ven: 7.347 (ref 7.250–7.430)
pO2, Ven: 36 mmHg (ref 32.0–45.0)
pO2, Ven: 38 mmHg (ref 32.0–45.0)

## 2018-04-19 LAB — SJOGRENS SYNDROME-B EXTRACTABLE NUCLEAR ANTIBODY: SSB (La) (ENA) Antibody, IgG: <0.2 AI (ref 0.0–0.9)

## 2018-04-19 LAB — BASIC METABOLIC PANEL
Anion gap: 8 (ref 5–15)
BUN: 26 mg/dL — ABNORMAL HIGH (ref 8–23)
CO2: 31 mmol/L (ref 22–32)
Calcium: 8.5 mg/dL — ABNORMAL LOW (ref 8.9–10.3)
Chloride: 102 mmol/L (ref 98–111)
Creatinine, Ser: 1.03 mg/dL — ABNORMAL HIGH (ref 0.44–1.00)
GFR calc Af Amer: >60 mL/min (ref >60)
GFR calc non Af Amer: 52 mL/min — ABNORMAL LOW (ref >60)
Glucose, Bld: 124 mg/dL — ABNORMAL HIGH (ref 70–99)
Potassium: 3.9 mmol/L (ref 3.5–5.1)
Sodium: 141 mmol/L (ref 135–145)

## 2018-04-19 LAB — HEPATIC FUNCTION PANEL
ALT: 13 U/L (ref 0–44)
AST: 17 U/L (ref 15–41)
Albumin: 3.1 g/dL — ABNORMAL LOW (ref 3.5–5.0)
Alkaline Phosphatase: 82 U/L (ref 38–126)
Bilirubin, Direct: 0.1 mg/dL (ref 0.0–0.2)
Indirect Bilirubin: 0.5 mg/dL (ref 0.3–0.9)
Total Bilirubin: 0.6 mg/dL (ref 0.3–1.2)
Total Protein: 6 g/dL — ABNORMAL LOW (ref 6.5–8.1)

## 2018-04-19 LAB — GLUCOSE, CAPILLARY
Glucose-Capillary: 115 mg/dL — ABNORMAL HIGH (ref 70–99)
Glucose-Capillary: 102 mg/dL — ABNORMAL HIGH (ref 70–99)
Glucose-Capillary: 110 mg/dL — ABNORMAL HIGH (ref 70–99)
Glucose-Capillary: 173 mg/dL — ABNORMAL HIGH (ref 70–99)
Glucose-Capillary: 107 mg/dL — ABNORMAL HIGH (ref 70–99)

## 2018-04-19 LAB — ANTI-DNA ANTIBODY, DOUBLE-STRANDED: ds DNA Ab: <1 IU/mL (ref 0–9)

## 2018-04-19 LAB — TYPE AND SCREEN
ABO/RH(D): B POS
Antibody Screen: NEGATIVE

## 2018-04-19 LAB — COOXEMETRY PANEL
Carboxyhemoglobin: 1.5 % (ref 0.5–1.5)
Methemoglobin: 1.5 % (ref 0.0–1.5)
O2 Saturation: 89.7 %
Total hemoglobin: 9.2 g/dL — ABNORMAL LOW (ref 12.0–16.0)

## 2018-04-19 LAB — CYCLIC CITRUL PEPTIDE ANTIBODY, IGG/IGA: CCP Antibodies IgG/IgA: 7 units (ref 0–19)

## 2018-04-19 LAB — GLOMERULAR BASEMENT MEMBRANE ANTIBODIES: GBM Ab: 2 units (ref 0–20)

## 2018-04-19 LAB — C-REACTIVE PROTEIN: CRP: 0.8 mg/dL (ref <1.0)

## 2018-04-19 LAB — ANTI-SCLERODERMA ANTIBODY: Scleroderma (Scl-70) (ENA) Antibody, IgG: <0.2 AI (ref 0.0–0.9)

## 2018-04-19 LAB — INTRINSIC FACTOR ANTIBODIES: Intrinsic Factor: 1.1 AU/mL (ref 0.0–1.1)

## 2018-04-19 LAB — SJOGRENS SYNDROME-A EXTRACTABLE NUCLEAR ANTIBODY: SSA (Ro) (ENA) Antibody, IgG: <0.2 AI (ref 0.0–0.9)

## 2018-04-19 LAB — SEDIMENTATION RATE: Sed Rate: 50 mm/h — ABNORMAL HIGH (ref 0–22)

## 2018-04-19 SURGERY — RIGHT HEART CATH
Anesthesia: LOCAL

## 2018-04-19 MED ORDER — ACETAMINOPHEN 325 MG PO TABS
650.0000 mg | ORAL_TABLET | ORAL | Status: DC | PRN
  Administered 2018-04-19: 650 mg via ORAL
  Filled 2018-04-19 (×2): qty 2

## 2018-04-19 MED ORDER — SODIUM CHLORIDE 0.9% FLUSH
3.0000 mL | Freq: Two times a day (BID) | INTRAVENOUS | Status: DC
  Administered 2018-04-19 – 2018-04-20 (×3): 3 mL via INTRAVENOUS

## 2018-04-19 MED ORDER — LIDOCAINE HCL (PF) 1 % IJ SOLN
INTRAMUSCULAR | Status: AC
  Filled 2018-04-19: qty 60

## 2018-04-19 MED ORDER — APIXABAN 5 MG PO TABS
5.0000 mg | ORAL_TABLET | Freq: Two times a day (BID) | ORAL | Status: DC
  Administered 2018-04-19 – 2018-04-20 (×2): 5 mg via ORAL
  Filled 2018-04-19 (×3): qty 1

## 2018-04-19 MED ORDER — SODIUM CHLORIDE 0.9% FLUSH
3.0000 mL | INTRAVENOUS | Status: DC | PRN
  Administered 2018-04-19: 3 mL via INTRAVENOUS

## 2018-04-19 MED ORDER — FENTANYL CITRATE (PF) 100 MCG/2ML IJ SOLN
INTRAMUSCULAR | Status: AC
  Filled 2018-04-19: qty 2

## 2018-04-19 MED ORDER — HEPARIN (PORCINE) IN NACL 1000-0.9 UT/500ML-% IV SOLN
INTRAVENOUS | Status: AC
  Filled 2018-04-19: qty 500

## 2018-04-19 MED ORDER — ONDANSETRON HCL 4 MG/2ML IJ SOLN: 4.0000 mg | Freq: Four times a day (QID) | INTRAMUSCULAR | Status: DC | PRN

## 2018-04-19 MED ORDER — BUTALBITAL-APAP-CAFFEINE 50-325-40 MG PO TABS
1.0000 | ORAL_TABLET | Freq: Every day | ORAL | Status: DC | PRN
  Administered 2018-04-20: 1 via ORAL
  Filled 2018-04-19: qty 1

## 2018-04-19 MED ORDER — SODIUM CHLORIDE 0.9 % IV SOLN: 250.0000 mL | INTRAVENOUS | Status: DC | PRN

## 2018-04-19 MED ORDER — HEPARIN (PORCINE) IN NACL 1000-0.9 UT/500ML-% IV SOLN
INTRAVENOUS | Status: DC | PRN
  Administered 2018-04-19: 500 mL

## 2018-04-19 MED ORDER — FENTANYL CITRATE (PF) 100 MCG/2ML IJ SOLN
INTRAMUSCULAR | Status: DC | PRN
  Administered 2018-04-19 (×2): 25 ug via INTRAVENOUS

## 2018-04-19 MED ORDER — TORSEMIDE 20 MG PO TABS
60.0000 mg | ORAL_TABLET | Freq: Two times a day (BID) | ORAL | Status: DC
  Administered 2018-04-19 – 2018-04-20 (×3): 60 mg via ORAL
  Filled 2018-04-19 (×4): qty 3

## 2018-04-19 MED ORDER — LIDOCAINE HCL (PF) 1 % IJ SOLN
INTRAMUSCULAR | Status: DC | PRN
  Administered 2018-04-19: 10 mL via SUBCUTANEOUS

## 2018-04-19 MED ORDER — MIDAZOLAM HCL 2 MG/2ML IJ SOLN
INTRAMUSCULAR | Status: AC
  Filled 2018-04-19: qty 2

## 2018-04-19 MED ORDER — LIDOCAINE HCL (PF) 1 % IJ SOLN
INTRAMUSCULAR | Status: AC
  Filled 2018-04-19: qty 30

## 2018-04-19 MED ORDER — MIDAZOLAM HCL 2 MG/2ML IJ SOLN
INTRAMUSCULAR | Status: DC | PRN
  Administered 2018-04-19: 1 mg via INTRAVENOUS

## 2018-04-19 SURGICAL SUPPLY — 9 items
CATH SWAN GANZ 7F STRAIGHT (CATHETERS) ×2 IMPLANT
GUIDEWIRE .025 260CM (WIRE) ×2 IMPLANT
KIT HEART LEFT (KITS) ×2 IMPLANT
PACK CARDIAC CATHETERIZATION (CUSTOM PROCEDURE TRAY) ×2 IMPLANT
SHEATH GLIDE SLENDER 4/5FR (SHEATH) IMPLANT
SHEATH PINNACLE 7F 10CM (SHEATH) ×2 IMPLANT
SHEATH PROBE COVER 6X72 (BAG) ×2 IMPLANT
TRANSDUCER W/STOPCOCK (MISCELLANEOUS) ×2 IMPLANT
TUBING CIL FLEX 10 FLL-RA (TUBING) ×2 IMPLANT

## 2018-04-19 NOTE — Interval H&P Note (Signed)
History and Physical Interval Note:  04/19/2018 8:01 AM  Debra Saunders  has presented today for surgery, with the diagnosis of HF  The various methods of treatment have been discussed with the patient and family. After consideration of risks, benefits and other options for treatment, the patient has consented to  Procedure(s): RIGHT HEART CATH (N/A) as a surgical intervention .  The patient's history has been reviewed, patient examined, no change in status, stable for surgery.  I have reviewed the patient's chart and labs.  Questions were answered to the patient's satisfaction.     [place] [employer]

## 2018-04-19 NOTE — Progress Notes (Signed)
Transferred to cath lab at 0735hrs via bed.  Milrinone off per previous shift.

## 2018-04-19 NOTE — Progress Notes (Signed)
Patient ID: Debra Saunders, female   DOB: 1945/12/31, 72 y.o.   MRN: [id number]     Advanced Heart Failure Rounding Note  PCP-Cardiologist: [name], MD   Subjective:    Patient diuresed well again yesterday, creatinine down to 1.03.  Milrinone stopped this morning for RHC.   V/Q scan: No evidence for chronic PE.   High resolution CT chest done, report pending.   RHC Procedural Findings: Hemodynamics (mmHg) RA mean 6 RV 57/11 PA 56/17, mean 33 PCWP mean 12 Oxygen saturations: PA 65% AO 99% Cardiac Output (Fick) 6.26  Cardiac Index (Fick) 3.23 PVR 3.4 WU  Objective:   Weight Range: 98.9 kg Body mass index is 42.58 kg/m.   Vital Signs:   Temp:  [98.4 F (36.9 C)-98.5 F (36.9 C)] 98.4 F (36.9 C) (10/28 0448) Pulse Rate:  [0-63] 0 (10/28 0837) Resp:  [0-39] 0 (10/28 0837) BP: (127-175)/(60-83) 162/83 (10/28 0832) SpO2:  [0 %-100 %] 0 % (10/28 0837) Weight:  [98.9 kg] 98.9 kg (10/28 0448) Last BM Date: 04/18/18  Weight change: Filed Weights   04/17/18 0722 04/18/18 0513 04/19/18 0448  Weight: 101.8 kg 102.1 kg 98.9 kg    Intake/Output:   Intake/Output Summary (Last 24 hours) at 04/19/2018 0846 Last data filed at 04/19/2018 0600 Gross per 24 hour  Intake 1274.65 ml  Output 2300 ml  Net -1025.35 ml      Physical Exam    General: NAD Neck: No JVD, no thyromegaly or thyroid nodule.  Lungs: Clear to auscultation bilaterally with normal respiratory effort. CV: Nondisplaced PMI.  Heart regular S1/S2, no S3/S4, no murmur.  No pitting edema but left leg chronically larger than right. Abdomen: Soft, nontender, no hepatosplenomegaly, no distention.  Skin: Intact without lesions or rashes.  Neurologic: Alert and oriented x 3.  Psych: Normal affect. Extremities: No clubbing or cyanosis.  HEENT: Normal.    Telemetry   A-paced, personally reviewed.   Labs    CBC Recent Labs    04/18/18 0441 04/18/18 1519 04/19/18 0417  WBC 5.3  --  5.3    NEUTROABS  --   --  2.9  HGB 8.2* 7.8* 8.9*  HCT 29.5* 27.1* 31.1*  MCV 91.3  --  91.2  PLT 196  --  427   Basic Metabolic Panel Recent Labs    04/17/18 0456 04/18/18 0441 04/19/18 0417  NA 140 141 141  K 3.5 3.4* 3.9  CL 99 102 102  CO2 31 32 31  GLUCOSE 130* 136* 124*  BUN 45* 33* 26*  CREATININE 1.64* 1.25* 1.03*  CALCIUM 8.9 8.7* 8.5*  MG 2.2 2.3  --    Liver Function Tests Recent Labs    04/19/18 0417  AST 17  ALT 13  ALKPHOS 82  BILITOT 0.6  PROT 6.0*  ALBUMIN 3.1*   No results for input(s): LIPASE, AMYLASE in the last 72 hours. Cardiac Enzymes Recent Labs    04/17/18 0456  CKTOTAL 72  CKMB 1.2    BNP: BNP (last 3 results) Recent Labs    10/28/17 0350 11/05/17 1514 04/15/18 1618  BNP 290.0* 989.0* 35.8    ProBNP (last 3 results) No results for input(s): PROBNP in the last 8760 hours.   D-Dimer No results for input(s): DDIMER in the last 72 hours. Hemoglobin A1C Recent Labs    04/16/18 1821  HGBA1C 5.8*   Fasting Lipid Panel No results for input(s): CHOL, HDL, LDLCALC, TRIG, CHOLHDL, LDLDIRECT in the last 72 hours. Thyroid  Function Tests Recent Labs    04/17/18 0456  TSH 0.808    Other results:   Imaging    No results found.   Medications:     Scheduled Medications: . [MAR Hold] amiodarone  200 mg Oral Daily  . [MAR Hold] furosemide  80 mg Intravenous BID  . [MAR Hold] gabapentin  200 mg Oral TID  . [MAR Hold] insulin aspart  0-5 Units Subcutaneous QHS  . [MAR Hold] insulin aspart  0-9 Units Subcutaneous TID WC  . [MAR Hold] potassium chloride SA  20 mEq Oral BID  . [MAR Hold] rosuvastatin  10 mg Oral Daily  . [MAR Hold] sildenafil  20 mg Oral TID  . [MAR Hold] sodium chloride flush  10-40 mL Intracatheter Q12H  . [MAR Hold] sodium chloride flush  3 mL Intravenous Q12H  . sodium chloride flush  3 mL Intravenous Q12H  . [MAR Hold] spironolactone  12.5 mg Oral Daily  . torsemide  60 mg Oral BID  . [MAR Hold]  vitamin B-12  1,000 mcg Oral Daily    Infusions: . [MAR Hold] sodium chloride    . sodium chloride    . sodium chloride 10 mL/hr at 04/19/18 0509  . milrinone Stopped (04/19/18 0719)    PRN Medications: [MAR Hold] sodium chloride, sodium chloride, [MAR Hold] acetaminophen, [MAR Hold] albuterol, [MAR Hold] ondansetron (ZOFRAN) IV, [MAR Hold] ondansetron, [MAR Hold] sodium chloride flush, [MAR Hold] sodium chloride flush, sodium chloride flush    Patient Profile   Debra Saunders is a 72 y.o. 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  Admitted with A/C diastolic HF with poor response to increased diuretics outpatient.   Assessment/Plan   1. 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  Echo this admission showed EF 60-65% with moderate LVH, RV was difficult to see.  Based on cath from 4/19, severe primarily pulmonary artery HTN with PVR 7.2 WU.  She has been on torsemide 40 mg po bid at home with occasional doses of metolazone but was failing home management.  Creatinine up to 1.75 this admission. I started her on milrinone to support RV and have been diuresing with IV Lasix.  Creatinine down to 1.03 today.  Filling pressures optimized on RHC, cardiac output preserved.  Moderate pulmonary arterial hypertension.  She is off milrinone now. - Transition back to po diuretics, will use torsemide 60 mg bid, first dose this afternoon.  2. Pulmonary hypertension: She was [facility] in the past [facility].  Based on 4/19 cath, she has severe PAH with PVR 7.2 WU.  She has been on sildenafil 20 mg tid x 4 years. At one point, she was on ERA, thought this helped.  Chronic hypoxemic  respiratory failure, on home oxygen.  Prior CTA chest from 2/19 showed no PE but there was evidence for interstitial fibrosis with mild bronchiectasis.  She was not a heavy smoker.  She also has a history of remote DVT (2004) but V/Q scan negative for evidence of chronic PE.  Dr. Chase Caller has seen, concerned for possible burnt-out sarcoidosis as cause of her interstitial fibrosis and hilar adenopathy.  She has family history of this.  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  ACE not elevated, ESR only 32.  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  ESR not high.  RHC today showed moderate pulmonary hypertension, PVR 3.4 WU which is significantly lower than on the 4/19 cath.  - Pending read on high resolution CT chest to assess ILD.  Will await pulmonary advice regarding amiodarone based on CT read.   - Continue sildenafil, will add ERA as outpatient (Opsumit versus Letairis).  3. AKI on CKD stage 3: Concern for cardiorenal syndrome.  Creatinine improved on milrinone.  4. Atrial fibrillation: She is a-paced.  She has been on apixaban (switched from Xarelto after 2014 SAH) and amiodarone.   - Restart Eliquis post-cath this evening.  5. OSA: Continue CPAP.   Length of Stay: 4  [name], MD  04/19/2018, 8:46 AM  Advanced Heart Failure Team Pager [phone] (M-F; 7a - 4p)  Please contact [facility] for night-coverage after hours (4p -7a ) and weekends on [email or web address]

## 2018-04-19 NOTE — Progress Notes (Signed)
PROGRESS NOTE    [name]  FGH:[id number] DOB: Mar 07, 1946 DOA: 04/15/2018 PCP: Debra Haven, MD   Brief Narrative: Debra Saunders is Debra Saunders 71 y.o. female with medical history significant of diastolic dysfunction CHF, atrial fibrillation on chronic Eliquis, history of cardiorenal syndrome requiring hemodialysis in the past, COPD, hypertension, morbid obesity, diabetes, hyperlipidemia and previous CVA who came to the ER complaining of shortness of breath and lower extremity edema.  Patient has been dealing with excess fluid with worsening edema just last week.  She was seen by her cardiologist who increased her oral diuretic dose Debra Saunders week ago.  Patient did not get adequate urinary response and did not have any decrease in her fluid status.  She has gained significant weight since then.  She is on home O2 at 4 L and has been requiring even more.  She called her cardiologist today who recommended hospital admissions with IV diuretics.  Patient's creatinine has slightly worsened over her baseline.  With her history of previous cardiorenal syndrome and failing to respond to oral diuretics she is being admitted for further work-up..  Assessment & Plan:   Principal Problem:   acute on chronic diastolic CHF (congestive heart failure) (HCC) Active Problems:   Hypertension   Pulmonary hypertension, unspecified (HCC)   Hyperlipidemia   Hypothyroidism   Chronic respiratory failure with hypoxia (HCC)   Atrial fibrillation (HCC)   HFpEF Exacerbation: Echo from 02/2018 with normal EF, grade 1 diastolic dysfunction.  Increased PASP.  Patient has failed outpatient diuretic therapy.   Cardiology c/s appreciate recs -> placing PICC, following CVP, starting milrinone and lasix 80 BID.   Spiro 12.5 mg daily.  Holding arb and betablocker. Milrinone d/c'd.  Now on PO torsemide.  RHC with optimized filling pressures, preserved CO, mod pulm HTN.  Pulmonary Hypertension:   Continue sildenafil for Debra Saunders High  res chest CT pending V/Q scan negative for PE Planning for RHC tomorrow Cardiolgy c/s appreciate recs. Planning to add ERA outpatient.  Decision regarding amiodarone pending high res CT (see below - pulm recommending d/c if ok with cards) Pulm c/s, appreciate recs - follow autoimmune, vasculitis, HP panels, lupus anticoagulant.  Repeat PFTs'.    Interstitial Lung Disease: High res CT with stable pattern of fibrosis.  Recommending d/c amiodarone if ok with cardiology.  Pt will f/u with Dr. Chase Saunders as an outpatient.  Follow ESR, CRP.    acute on chronic kidney disease stage III: Patient's baseline creatinine in September was 1.1.  Improved to 1.03 today (peaked to 1.84 during this admission). Follow with diuresis  hypertension: holding arb and bb, continue spiro  atrial fibrillation  S/p pacemaker: Holding eliquis for RHC tomorrow.  Continue amiodarone for now.  chronic respiratory failure: On home O2, baseline 4 L Debra Saunders.  Continue oxygen by nasal cannula  Anemia: b12 deficiency, now on oral as noted below.  Hb has been downtrending down since admission, but stable today.  No evidence of bleeding, on discussion with nurse, she had brown normal stool.  Will continue to trend.  Follow hemoccult.      hypothyroidism: Continue levothyroxine.  morbid obesity: Dietary counseling.  B12 deficiency: follow IF ab (negative), start vitamin B12  Headache: seems like tension HA in description, continue APAP prn.  Try fioricet prn.   Rounded Lucency in T8 Vertebral Body: unclear significance, will need outpatient follow up  Hx DVT s/p IVC filter  Hx CVA  DVT prophylaxis: eliquis Code Status: full  Family Communication: none  at bedside Disposition Plan: pending improvement   Consultants:   Cardiology  pulm   Procedures:  Echo Study Conclusions  - Left ventricle: The cavity size was normal. Wall thickness was   increased in Debra Saunders pattern of moderate LVH. Systolic function was    normal. The estimated ejection fraction was in the range of 60%   to 65%. Wall motion was normal; there were no regional wall   motion abnormalities. Left ventricular diastolic function   parameters were normal. - Aortic valve: Sclerosis without stenosis. - Mitral valve: There was mild regurgitation. - Left atrium: The atrium was moderately dilated. - Pulmonary arteries: PA peak pressure: 52 mm Hg (S).   Antimicrobials:  Anti-infectives (From admission, onward)   None      Subjective: Notes persistent HA, improved with APAP, fioricet Otherwise, feeling ok  Objective: Vitals:   04/19/18 1252 04/19/18 1322 04/19/18 1422 04/19/18 1522  BP: 136/66 98/68 (!) 148/67 133/73  Pulse: 62 68 60 60  Resp:  16 (!) 27 17  Temp:      TempSrc:      SpO2: 98% 95% 98% 100%  Weight:      Height:        Intake/Output Summary (Last 24 hours) at 04/19/2018 1945 Last data filed at 04/19/2018 1600 Gross per 24 hour  Intake 799.54 ml  Output 1801 ml  Net -1001.46 ml   Filed Weights   04/17/18 0722 04/18/18 0513 04/19/18 0448  Weight: 101.8 kg 102.1 kg 98.9 kg    Examination:  General: No acute distress. Cardiovascular: Heart sounds show Debra Saunders regular rate, and rhythm. Lungs: Clear to auscultation bilaterally  Abdomen: Soft, nontender, nondistended  Neurological: Alert and oriented 3. Moves all extremities 4. Cranial nerves II through XII grossly intact. Skin: Warm and dry. No rashes or lesions. Extremities: No clubbing or cyanosis. L>R LEE Psychiatric: Mood and affect are normal. Insight and judgment are appropriate.   Data Reviewed: I have personally reviewed following labs and imaging studies  CBC: Recent Labs  Lab 04/15/18 1618 04/16/18 0611 04/17/18 0456 04/18/18 0441 04/18/18 1519 04/19/18 0417  WBC 6.2 6.0 5.4 5.3  --  5.3  NEUTROABS  --  3.3  --   --   --  2.9  HGB 10.6* 9.5* 9.3* 8.2* 7.8* 8.9*  HCT 36.6 33.1* 31.5* 29.5* 27.1* 31.1*  MCV 89.5 89.2 88.2 91.3  --   91.2  PLT 234 210 205 196  --  703   Basic Metabolic Panel: Recent Labs  Lab 04/15/18 1618 04/16/18 0611 04/17/18 0456 04/18/18 0441 04/19/18 0417  NA 140 142 140 141 141  K 3.9 3.6 3.5 3.4* 3.9  CL 98 99 99 102 102  CO2 32 32 31 32 31  GLUCOSE 89 108* 130* 136* 124*  BUN 47* 45* 45* 33* 26*  CREATININE 1.84* 1.75* 1.64* 1.25* 1.03*  CALCIUM 9.9 9.4 8.9 8.7* 8.5*  MG  --   --  2.2 2.3  --    GFR: Estimated Creatinine Clearance: 50.6 mL/min (Derhonda [place]lick) (by C-G formula based on SCr of 1.03 mg/dL (H)). Liver Function Tests: Recent Labs  Lab 04/15/18 1618 04/16/18 0611 04/19/18 0417  AST _0 ALT _1 ALKPHOS 106 94 82  BILITOT 0.7 0.7 0.6  PROT 7.2 6.5 6.0*  ALBUMIN 3.7 3.3* 3.1*   No results for input(s): LIPASE, AMYLASE in the last 168 hours. No results for input(s): AMMONIA in the last 168 hours. Coagulation Profile: No  results for input(s): INR, PROTIME in the last 168 hours. Cardiac Enzymes: Recent Labs  Lab 04/17/18 0456  CKTOTAL 72  CKMB 1.2   BNP (last 3 results) No results for input(s): PROBNP in the last 8760 hours. HbA1C: No results for input(s): HGBA1C in the last 72 hours. CBG: Recent Labs  Lab 04/18/18 1706 04/19/18 0739 04/19/18 0858 04/19/18 1144 04/19/18 1643  GLUCAP 97 115* 102* 110* 173*   Lipid Profile: No results for input(s): CHOL, HDL, LDLCALC, TRIG, CHOLHDL, LDLDIRECT in the last 72 hours. Thyroid Function Tests: Recent Labs    04/17/18 0456  TSH 0.808   Anemia Panel: Recent Labs    04/17/18 0456  VITAMINB12 121*  FOLATE 7.8  FERRITIN 49  TIBC 461*  IRON 76  RETICCTPCT 1.5   Sepsis Labs: No results for input(s): PROCALCITON, LATICACIDVEN in the last 168 hours.  Recent Results (from the past 240 hour(s))  MRSA PCR Screening     Status: None   Collection Time: 04/15/18 11:23 PM  Result Value Ref Range Status   MRSA by PCR NEGATIVE NEGATIVE Final    Comment:        [name] (FDA approved for  NASAL specimens only), is one component of [facility] We[facility]ler comprehensive MRSA colonization surveillance program. It is not intended to diagnose MRSA infection nor to guide or monitor treatment for MRSA infections. Performed at [facility], [place] [address], [place], [place] [address]          Radiology Studies: No results found.      Scheduled Meds: . amiodarone  200 mg Oral Daily  . apixaban  5 mg Oral BID  . furosemide  80 mg Intravenous BID  . gabapentin  200 mg Oral TID  . insulin aspart  0-5 Units Subcutaneous QHS  . insulin aspart  0-9 Units Subcutaneous TID WC  . potassium chloride SA  20 mEq Oral BID  . rosuvastatin  10 mg Oral Daily  . sildenafil  20 mg Oral TID  . sodium chloride flush  10-40 mL Intracatheter Q12H  . sodium chloride flush  3 mL Intravenous Q12H  . sodium chloride flush  3 mL Intravenous Q12H  . spironolactone  12.5 mg Oral Daily  . torsemide  60 mg Oral BID  . vitamin B-12  1,000 mcg Oral Daily   Continuous Infusions: . sodium chloride Stopped (04/19/18 0930)  . sodium chloride       LOS: 4 days    Time spent: over 30 min    Fayrene Helper, MD [facility] Pager [phone]   If 7PM-7AM, please contact night-coverage [email or web address] Password [email or web address] 04/19/2018, 7:45 PM

## 2018-04-19 NOTE — Progress Notes (Addendum)
Site area: RFV Site Prior to Removal:  Level 0 Pressure Applied For:15 min Manual:   yes Patient Status During Pull:  stable Post Pull Site:  Level 0 Post Pull Instructions Given:  yes Post Pull Pulses Present: palpable DP Dressing Applied:  clear Bedrest begins @ 0915 till 1115 Comments: CBG 102  Prior to sheath removal

## 2018-04-19 NOTE — Progress Notes (Signed)
PT Cancellation Note  Patient Details Name: Debra Saunders MRN: [id number] DOB: 04/16/1946   Cancelled Treatment:    Reason Eval/Treat Not Completed: Patient at procedure or test/unavailable Pt off floor at cath lab. Will follow up as time allows.   [name] 04/19/2018, 8:39 AM Wray Kearns, PT, DPT Acute Rehabilitation Services Pager [phone] Office [phone]

## 2018-04-19 NOTE — Progress Notes (Signed)
Physical Therapy Treatment Patient Details Name: Debra Saunders MRN: [id number] DOB: 1945/11/20 Today's Date: 04/19/2018    History of Present Illness  72 y.o. 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 s/p cardiac cath 10/28.    PT Comments    Patient progressing slowly towards PT goals. Pt s/p cardiac cath this morning. Pt with LOB upon standing from EOB requiring Min A to maintain balance as well as when turning around from rollator seat to walk. Reports feeling weaker today. Pt with 3/4 DOE. Sp02 remained >95% on 4L/min 02. Requires 1 seated rest break due to fatigue and dypsnea. Will follow.    Follow Up Recommendations  Home health PT;Supervision - Intermittent(resume)     Equipment Recommendations  None recommended by PT    Recommendations for Other Services       Precautions / Restrictions Precautions Precautions: Fall Restrictions Weight Bearing Restrictions: No    Mobility  Bed Mobility Overal bed mobility: Modified Independent             General bed mobility comments: increased time, no physical assist required, use of rails.  Transfers Overall transfer level: Needs assistance Equipment used: 4-wheeled walker Transfers: Sit to/from Stand Sit to Stand: Min guard;Min assist         General transfer comment: min/guard for safety, LOB upon standing requiring Min A to maintain balance. + dizziness standing from rollator seat.  Ambulation/Gait Ambulation/Gait assistance: Min assist;Min guard Gait Distance (Feet): 65 Feet Assistive device: 4-wheeled walker Gait Pattern/deviations: Step-through pattern;Decreased  stride length;Trunk flexed     General Gait Details: Slow, mildly unsteady gait with use of rollator; Sp02 remained >95% on 4L/min 02. 3/4 DOE. 1 seated rest break. Cues for using rollator brakes.    Stairs             Wheelchair Mobility    Modified Rankin (Stroke Patients Only)       Balance Overall balance assessment: Needs assistance Sitting-balance support: Feet supported;No upper extremity supported Sitting balance-Leahy Scale: Good     Standing balance support: During functional activity;Bilateral upper extremity supported Standing balance-Leahy Scale: Poor Standing balance comment: Requires BUE support in standing, LOB when turning from rollator seat to walk.                            Cognition Arousal/Alertness: Awake/alert Behavior During Therapy: WFL for tasks assessed/performed Overall Cognitive Status: Within Functional Limits for tasks assessed                                        Exercises      General Comments        Pertinent Vitals/Pain Pain Assessment: No/denies pain    Home Living                      Prior Function            PT Goals (current goals can now be found in the care plan section) Progress towards PT goals: Progressing toward goals  Frequency    Min 3X/week      PT Plan Current plan remains appropriate    Co-evaluation              AM-PAC PT "6 Clicks" Daily Activity  Outcome Measure  Difficulty turning over in bed (including adjusting bedclothes, sheets and blankets)?: None Difficulty moving from lying on back to sitting on the side of the bed? : A Little Difficulty sitting down on and standing up from a chair with arms (e.g., wheelchair, bedside commode, etc,.)?: Unable Help needed moving to and from a bed to chair (including a wheelchair)?: A Little Help needed walking in hospital room?: A Little Help needed climbing 3-5 steps with a railing? : A Lot 6 Click  Score: 16    End of Session Equipment Utilized During Treatment: Gait belt;Oxygen Activity Tolerance: Patient limited by fatigue Patient left: in bed;with call bell/phone within reach Nurse Communication: Mobility status PT Visit Diagnosis: Difficulty in walking, not elsewhere classified (R26.2)     Time: 1437-1500 PT Time Calculation (min) (ACUTE ONLY): 23 min  Charges:  $Gait Training: 8-22 mins $Therapeutic Exercise: 8-22 mins                     Wray Kearns, PT, DPT Acute Rehabilitation Services Pager [phone] Office [place] 04/19/2018, 3:12 PM

## 2018-04-19 NOTE — Progress Notes (Addendum)
NAME:  Debra Saunders, MRN:  [id number], DOB:  1945/11/26, LOS: 4 ADMISSION DATE:  04/15/2018, CONSULTATION DATE:  10/26 REFERRING MD:  TRH, CHIEF COMPLAINT:  ILD, pulm HTN    Brief History   72 year old female with history of chronic diastolic heart failure, CAD, A. fib on Eliquis, CKD, remote history of DVT, OSA on CPAP, chronic hypoxic respiratory failure on home O2 on the basis of COPD and severe pulmonary hypertension admitted 10/24 with acute on chronic diastolic heart failure after she had poor response to increase diuretics as an outpatient.  Course has been complicated by AKI on CKD as well as ? ILD +/- amiodarone toxicity.  Significant Hospital Events     Consults: date of consult/date signed off & final recs:    Procedures (surgical and bedside):  Right heart cath 10/28 >>pulmonary hypertension, PVR 3.4 (down significantly from 7.2 on right heart cath 4/19)  Significant Diagnostic Tests:  2D echo >>EF 60 to 65% with moderate LVH. HRCT chest 10/28>>> 1. Pulmonary parenchymal pattern of fibrosis is stable and may be post inflammatory in etiology. Chronic hypersensitivity pneumonitis is another consideration. Findings are suggestive of an alternative diagnosis (not UIP) per consensus guidelines 2. Rounded lucency in the T8 vertebral body, stable from 08/16/2017 but questionably present on 03/27/2015. Consider MR thoracic spine without and with contrast in further evaluation, as clinically indicated. Aortic atherosclerosis (ICD10-170.0). Coronary artery calcification. 3. Enlarged pulmonary arteries, indicative of pulmonary arterial Hypertension.  Micro Data:    Antimicrobials:    Subjective:  No complaints.  Had RHC this a.m.  Objective   Blood pressure 136/68, pulse 62, temperature 98.4 F (36.9 C), temperature source Oral, resp. rate 16, height 5' (1.524 m), weight 98.9 kg, SpO2 99 %. CVP:  [5 mmHg-9 mmHg] 5 mmHg      Intake/Output Summary (Last 24 hours) at  04/19/2018 1435 Last data filed at 04/19/2018 0600 Gross per 24 hour  Intake 794.65 ml  Output 1300 ml  Net -505.35 ml   Filed Weights   04/17/18 0722 04/18/18 0513 04/19/18 0448  Weight: 101.8 kg 102.1 kg 98.9 kg    Examination: General: Chronically ill-appearing female, no acute distress HENT: MM moist, no JVD Lungs: Respirations are even and nonlabored on 3 L nasal cannula, clear Cardiovascular: S1 S2 RRR Abdomen: Soft, nontender Extremities: Dry, 1+ L LE edema, no edema right lower extremity Neuro: awake, Alert, appropriate   Resolved Hospital Problem list     Assessment & Plan:  Acute on chronic hypoxic respiratory failure -likely multifactorial in the setting of acute on chronic heart failure with moderate to severe pulmonary HTN and underlying ILD +/- amiodarone toxicity.  ESR not significantly elevated. Overall improving with diuresis Plan- -Would hold amiodarone for now unless cardiology feels it is absolutely necessary - cannot say for certain that this is amiodarone toxicity but given her lung disease it is probably not the best drug for her -Continue diuresis per heart failure team -Supplemental O2 as needed - wears 4L _0     Disposition / Summary of Today's Plan 04/19/18    Continue diuresis per heart failure team    Labs   CBC: Recent Labs  Lab 04/15/18 1618 04/16/18 0611 04/17/18 0456 04/18/18 0441 04/18/18 1519 04/19/18 0417  WBC 6.2 6.0 5.4 5.3  --  5.3  NEUTROABS  --  3.3  --   --   --  2.9  HGB 10.6* 9.5* 9.3* 8.2* 7.8* 8.9*  HCT 36.6 33.1* 31.5* 29.5* 27.1* 31.1*  MCV 89.5 89.2 88.2 91.3  --  91.2  PLT 234 210 205 196  --  003    Basic Metabolic Panel: Recent Labs  Lab 04/15/18 1618 04/16/18 0611 04/17/18 0456 04/18/18 0441 04/19/18 0417  NA 140 142 140 141 141  K 3.9 3.6 3.5 3.4* 3.9  CL 98 99 99 102 102  CO2 32 32 31 32 31  GLUCOSE 89 108* 130* 136* 124*  BUN 47* 45* 45* 33* 26*  CREATININE 1.84* 1.75* 1.64* 1.25* 1.03*    CALCIUM 9.9 9.4 8.9 8.7* 8.5*  MG  --   --  2.2 2.3  --    GFR: Estimated Creatinine Clearance: 50.6 mL/min (A) (by C-G formula based on SCr of 1.03 mg/dL (H)). Recent Labs  Lab 04/16/18 0611 04/17/18 0456 04/18/18 0441 04/19/18 0417  WBC 6.0 5.4 5.3 5.3    Liver Function Tests: Recent Labs  Lab 04/15/18 1618 04/16/18 0611 04/19/18 0417  AST _0 ALT _1 ALKPHOS 106 94 82  BILITOT 0.7 0.7 0.6  PROT 7.2 6.5 6.0*  ALBUMIN 3.7 3.3* 3.1*   No results for input(s): LIPASE, AMYLASE in the last 168 hours. No results for input(s): AMMONIA in the last 168 hours.  ABG    Component Value Date/Time   PHART 7.457 (H) 01/17/2013 0416   PCO2ART 49.4 (H) 01/17/2013 0416   PO2ART 98.2 01/17/2013 0416   HCO3 29.2 (H) 03/08/2018 2148   TCO2 35.9 01/17/2013 0416   O2SAT 89.7 04/19/2018 0420     Coagulation Profile: No results for input(s): INR, PROTIME in the last 168 hours.  Cardiac Enzymes: Recent Labs  Lab 04/17/18 0456  CKTOTAL 72  CKMB 1.2    HbA1C: Hemoglobin A1C  Date/Time Value Ref Range Status  02/03/2013 04:41 AM 6.2 4.2 - 6.3 % Final    Comment:    The American Diabetes Association recommends that a primary goal of therapy should be <7% and that physicians should reevaluate the treatment regimen in patients with HbA1c values consistently >8%.   01/12/2013 04:35 AM 7.2 (H) 4.2 - 6.3 % Final    Comment:    The American Diabetes Association recommends that a primary goal of therapy should be <7% and that physicians should reevaluate the treatment regimen in patients with HbA1c values consistently >8%.    Hgb A1c MFr Bld  Date/Time Value Ref Range Status  04/16/2018 06:21 PM 5.8 (H) 4.8 - 5.6 % Final    Comment:    (NOTE) Pre diabetes:          5.7%-6.4% Diabetes:              >6.4% Glycemic control for   <7.0% adults with diabetes   09/30/2017 07:24 PM 5.8 (H) 4.8 - 5.6 % Final    Comment:    (NOTE) Pre diabetes:           5.7%-6.4% Diabetes:              >6.4% Glycemic control for   <7.0% adults with diabetes     CBG: Recent Labs  Lab 04/18/18 1142 04/18/18 1706 04/19/18 0739 04/19/18 0858 04/19/18 1144  GLUCAP 151* [address] [place]     Katy Whiteheart, NP 04/19/2018  2:35 PM Pager: [phone] or [phone]  Attending Note:  72 year old female with PMH of pulmonary HTN with concern for ILD who presents with progressive worsening of her SOB.  Patient had a CT done  that I reviewed myself that is concerning for burned out inflammatory process but appears stable from before.  On exam, lungs were clear with minimal bibasilar atelectasis.  Result of RHC noted, PVR has actually improved on sildenafil alone.  Off other P HTN medications due cost concerns.  Patient's O2 demand is actually improved (from 4L to 3L [place] currently.  Discussed with PCCM-NP.  ILD:  - No need for steroids at this time, patient I actually improving  - Unable to ascertain if this is amiodarone toxicity or not, I suspect that since clinically patient is not deteriorating that this is not amiodarone toxicity, however, usually with history of ILD amiodarone is not recommended.  Would recommend d/c of amiodarone if ok with cardiology.  - Will check ESR and CRP just as a baseline for Dr. Chase Caller to see as outpatient  Hypoxemia:  - Titrate O2 for a sat of 90% given pulmonary HTN  Pulmonary HTN:  - Continue sildenafil   - ESR and CRP as above  - Continue current management otherwise  OSA:  - Continue CPAP and f/u as outpatient  PCCM will sign off, please call back if needed.  Patient seen and examined, agree with above note.  I dictated the care and orders written for this patient under my direction.  Rush Farmer, [place]

## 2018-04-20 ENCOUNTER — Ambulatory Visit: Payer: Medicare HMO | Admitting: Physician Assistant

## 2018-04-20 DIAGNOSIS — I5031 Acute diastolic (congestive) heart failure: Secondary | ICD-10-CM

## 2018-04-20 LAB — MAGNESIUM: Magnesium: 2 mg/dL (ref 1.7–2.4)

## 2018-04-20 LAB — COOXEMETRY PANEL
Carboxyhemoglobin: 1.4 % (ref 0.5–1.5)
Methemoglobin: 1.6 % — ABNORMAL HIGH (ref 0.0–1.5)
O2 Saturation: 75.7 %
Total hemoglobin: 9.6 g/dL — ABNORMAL LOW (ref 12.0–16.0)

## 2018-04-20 LAB — GLUCOSE, CAPILLARY
Glucose-Capillary: 105 mg/dL — ABNORMAL HIGH (ref 70–99)
Glucose-Capillary: 126 mg/dL — ABNORMAL HIGH (ref 70–99)
Glucose-Capillary: 95 mg/dL (ref 70–99)

## 2018-04-20 LAB — BASIC METABOLIC PANEL
Anion gap: 10 (ref 5–15)
BUN: 23 mg/dL (ref 8–23)
CO2: 32 mmol/L (ref 22–32)
Calcium: 8.9 mg/dL (ref 8.9–10.3)
Chloride: 97 mmol/L — ABNORMAL LOW (ref 98–111)
Creatinine, Ser: 1.16 mg/dL — ABNORMAL HIGH (ref 0.44–1.00)
GFR calc Af Amer: 52 mL/min — ABNORMAL LOW (ref >60)
GFR calc non Af Amer: 45 mL/min — ABNORMAL LOW (ref >60)
Glucose, Bld: 120 mg/dL — ABNORMAL HIGH (ref 70–99)
Potassium: 4 mmol/L (ref 3.5–5.1)
Sodium: 139 mmol/L (ref 135–145)

## 2018-04-20 LAB — CBC
HCT: 31.9 % — ABNORMAL LOW (ref 36.0–46.0)
Hemoglobin: 9.3 g/dL — ABNORMAL LOW (ref 12.0–15.0)
MCH: 26.3 pg (ref 26.0–34.0)
MCHC: 29.2 g/dL — ABNORMAL LOW (ref 30.0–36.0)
MCV: 90.1 fL (ref 80.0–100.0)
Platelets: 185 10*3/uL (ref 150–400)
RBC: 3.54 MIL/uL — ABNORMAL LOW (ref 3.87–5.11)
RDW: 13 % (ref 11.5–15.5)
WBC: 5.4 10*3/uL (ref 4.0–10.5)
nRBC: 0 % (ref 0.0–0.2)

## 2018-04-20 LAB — ANTINUCLEAR ANTIBODIES, IFA: ANA Ab, IFA: NEGATIVE

## 2018-04-20 MED ORDER — CYANOCOBALAMIN 1000 MCG PO TABS: 1000.0000 ug | ORAL_TABLET | Freq: Every day | ORAL | 0 refills | Status: AC

## 2018-04-20 MED ORDER — TORSEMIDE 20 MG PO TABS: 60.0000 mg | ORAL_TABLET | Freq: Two times a day (BID) | ORAL | 0 refills | Status: DC

## 2018-04-20 MED FILL — Lidocaine HCl Local Preservative Free (PF) Inj 1%: INTRAMUSCULAR | Qty: 30 | Status: AC

## 2018-04-20 NOTE — Care Management (Signed)
1616 04-20-18 Patient was previous active with [facility]. [facility] Orders for RN/PT was placed in Epic- Referral called to liaison Randall with [facility]. No further needs from CM at this time. Bethena Roys, RN,BSN Case Manager [phone]

## 2018-04-20 NOTE — Progress Notes (Signed)
Patient received discharge information and acknowledged understanding of it. Patient IV was removed. IV team removed PICC line.

## 2018-04-20 NOTE — Progress Notes (Signed)
Patient has hospital follow-up appt on 04/27/2018 at 10:00 AM in the [facility].

## 2018-04-20 NOTE — Progress Notes (Addendum)
Patient ID: Debra Saunders, female   DOB: 1946/06/17, 72 y.o.   MRN: [id number]     Advanced Heart Failure Rounding Note  PCP-Cardiologist: [name], MD   Subjective:    Denies CP or SOB.   Good diuresis yesterday with PO torsemide. Weight not accurate. Creatinine stable 1.16. CVP 10-11. Coox 76% off milrinone.   Pulmonary advising to stop amio. Sed rate 50. CRP 0.8  V/Q scan: No evidence for chronic PE.   High resolution CT chest: 1. Pulmonary parenchymal pattern of fibrosis is stable and may be post inflammatory in etiology. Chronic hypersensitivity pneumonitis is another consideration. Findings are suggestive of an alternative diagnosis (not UIP) per consensus guidelines: Diagnosis of Idiopathic Pulmonary Fibrosis: An Official ATS/ERS/JRS/ALAT Clinical Practice Guideline. [place], Iss 5, [phone], Feb 21 2017. 2. Rounded lucency in the T8 vertebral body, stable from 08/16/2017 but questionably present on 03/27/2015. Consider MR thoracic spine without and with contrast in further evaluation, as clinically indicated. Aortic atherosclerosis (ICD10-170.0). Coronary artery calcification. 3. Enlarged pulmonary arteries, indicative of pulmonary arterial hypertension.   RHC Procedural Findings: Hemodynamics (mmHg) RA mean 6 RV 57/11 PA 56/17, mean 33 PCWP mean 12 Oxygen saturations: PA 65% AO 99% Cardiac Output (Fick) 6.26  Cardiac Index (Fick) 3.23 PVR 3.4 WU  Objective:   Weight Range: 102.6 kg Body mass index is 44.18 kg/m.   Vital Signs:   Temp:  [97.9 F (36.6 C)-98.6 F (37 C)] 98.2 F (36.8 C) (10/29 0735) Pulse Rate:  [0-68] 66 (10/29 0735) Resp:  [0-27] 18 (10/29 0735) BP: (98-169)/(54-102) 149/79 (10/29 0735) SpO2:  [0 %-100 %] 100 % (10/29 0735) Weight:  [102.6 kg] 102.6 kg (10/29 0500) Last BM Date: 04/19/18  Weight change: Filed Weights   04/18/18 0513 04/19/18 0448 04/20/18 0500  Weight: 102.1 kg 98.9 kg 102.6  kg    Intake/Output:   Intake/Output Summary (Last 24 hours) at 04/20/2018 0821 Last data filed at 04/20/2018 3888 Gross per 24 hour  Intake 617.89 ml  Output 1951 ml  Net -1333.11 ml      Physical Exam    General: No resp difficulty. HEENT: Normal Neck: Supple. JVP ~10. Carotids 2+ bilat; no bruits. No thyromegaly or nodule noted. Cor: PMI nondisplaced. RRR, No M/G/R noted Lungs: CTAB, normal effort. Abdomen: Soft, non-tender, non-distended, no HSM. No bruits or masses. +BS  Extremities: No cyanosis, clubbing, or rash. R and LLE no edema.  Neuro: Alert & orientedx3, cranial nerves grossly intact. moves all 4 extremities w/o difficulty. Affect pleasant   Telemetry   AV paced 60. Personally reviewed.   Labs    CBC Recent Labs    04/19/18 0417 04/20/18 0426  WBC 5.3 5.4  NEUTROABS 2.9  --   HGB 8.9* 9.3*  HCT 31.1* 31.9*  MCV 91.2 90.1  PLT 191 280   Basic Metabolic Panel Recent Labs    04/18/18 0441 04/19/18 0417 04/20/18 0426  NA 141 141 139  K 3.4* 3.9 4.0  CL 102 102 97*  CO2 32 31 32  GLUCOSE 136* 124* 120*  BUN 33* 26* 23  CREATININE 1.25* 1.03* 1.16*  CALCIUM 8.7* 8.5* 8.9  MG 2.3  --  2.0   Liver Function Tests Recent Labs    04/19/18 0417  AST 17  ALT 13  ALKPHOS 82  BILITOT 0.6  PROT 6.0*  ALBUMIN 3.1*   No results for input(s): LIPASE, AMYLASE in the last 72 hours. Cardiac Enzymes No  results for input(s): CKTOTAL, CKMB, CKMBINDEX, TROPONINI in the last 72 hours.  BNP: BNP (last 3 results) Recent Labs    10/28/17 0350 11/05/17 1514 04/15/18 1618  BNP 290.0* 989.0* 35.8    ProBNP (last 3 results) No results for input(s): PROBNP in the last 8760 hours.   D-Dimer No results for input(s): DDIMER in the last 72 hours. Hemoglobin A1C No results for input(s): HGBA1C in the last 72 hours. Fasting Lipid Panel No results for input(s): CHOL, HDL, LDLCALC, TRIG, CHOLHDL, LDLDIRECT in the last 72 hours. Thyroid Function  Tests No results for input(s): TSH, T4TOTAL, T3FREE, THYROIDAB in the last 72 hours.  Invalid input(s): FREET3  Other results:   Imaging    No results found.   Medications:     Scheduled Medications: . amiodarone  200 mg Oral Daily  . apixaban  5 mg Oral BID  . gabapentin  200 mg Oral TID  . insulin aspart  0-5 Units Subcutaneous QHS  . insulin aspart  0-9 Units Subcutaneous TID WC  . potassium chloride SA  20 mEq Oral BID  . rosuvastatin  10 mg Oral Daily  . sildenafil  20 mg Oral TID  . sodium chloride flush  10-40 mL Intracatheter Q12H  . sodium chloride flush  3 mL Intravenous Q12H  . sodium chloride flush  3 mL Intravenous Q12H  . spironolactone  12.5 mg Oral Daily  . torsemide  60 mg Oral BID  . vitamin B-12  1,000 mcg Oral Daily    Infusions: . sodium chloride Stopped (04/19/18 0930)  . sodium chloride      PRN Medications: sodium chloride, sodium chloride, acetaminophen, albuterol, butalbital-acetaminophen-caffeine, ondansetron (ZOFRAN) IV, ondansetron, sodium chloride flush, sodium chloride flush, sodium chloride flush    Patient Profile   Debra Saunders is a 72 y.o. 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  Admitted with A/C diastolic HF with poor response to increased diuretics outpatient.   Assessment/Plan   1. 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  Echo this admission showed EF 60-65% with moderate LVH, RV was difficult to see.  Based on cath from 4/19, severe primarily pulmonary artery HTN with PVR 7.2 WU.  She has been on torsemide 40 mg po bid at home with occasional doses of metolazone but was failing home management.   Creatinine up to 1.75 this admission. Started on milrinone to support RV and have been diuresing with IV Lasix.  Creatinine 1.16 today.  Filling pressures optimized on RHC, cardiac output preserved.  Moderate pulmonary arterial hypertension.  She is off milrinone now. - Continue torsemide 60 mg bid. CVP 10-11 2. Pulmonary hypertension: She was [facility] the past [facility].  Based on 4/19 cath, she has severe PAH with PVR 7.2 WU.  She has been on sildenafil 20 mg tid x 4 years. At one point, she was on ERA, thought this helped.  Chronic hypoxemic respiratory failure, on home oxygen.  Prior CTA chest from 2/19 showed no PE but there was evidence for interstitial fibrosis with mild bronchiectasis.  She was not a heavy smoker.  She also has a history of remote DVT (2004) but V/Q scan negative for evidence of chronic PE.  Dr. Chase Caller has seen, concerned for possible burnt-out sarcoidosis as cause of her interstitial fibrosis and hilar adenopathy.  She has family history of this.  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  ACE not elevated, ESR only 32.  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  ESR not high.  RHC showed moderate pulmonary hypertension, PVR 3.4 WU which is significantly lower than on the 4/19 cath.  - High Res Chest CT complete. See reading above.  - Pulm advising DC amio in setting of ILD. Dr Chase Caller will follow her outpatient.  - CRP 0.8, sed rate 50 - Continue sildenafil, will add ERA as outpatient (Opsumit versus Letairis).  3. AKI on CKD stage 3: Concern for cardiorenal syndrome.  Creatinine improved on milrinone.  4. Atrial fibrillation: She is a-paced.  She has been on apixaban (switched from Xarelto after 2014 SAH) and amiodarone.   - Back on eliquis post cath.  5. OSA: Continue CPAP. No change.   Addendum: Stop amio. Discussed with Dr [name].   Heart failure team will sign off as of 04/20/18. Dr [name] to  see prior to DC.  HF Medication Recommendations for Home: Eliquis 5 mg BID Potassium 20 meq BID Crestor 10 mg daily Sildenafil 20 mg TID Torsemide 60 mg BID (increased from 40 mg BID) Spiro 12.5 mg daily D/C amiodarone  Other recommendations (Labs,testing, etc): EKG, BMET. Recommend pulm f/u  Follow up as an outpatient: 04/27/18 10 am in AHF clinic  Length of Stay: [place], NP  04/20/2018, 8:21 AM  Advanced Heart Failure Team Pager [phone] (M-F; 7a - 4p)  Please contact [facility] for night-coverage after hours (4p -7a ) and weekends on [email or web address]  Patient seen with NP, agree with the above note. She is feeling better overall, looks euvolemic. High resolution CT chest reviewed, shows pulmonary fibrosis, not suggestive of sarcoidosis.  Possible amiodarone lung toxicity, pulmonary has recommended stopping amiodarone.  - Will stop amiodarone, watch for recurrent atrial fibrillation.  - Followup with Dr. Chase Caller for ILD.   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   - Will add ERA as outpatient (f/u arranged in CHF clinic).  - Continue sildenafil.   [name] 04/20/2018 4:18 PM

## 2018-04-20 NOTE — Progress Notes (Signed)
Physical Therapy Treatment Patient Details Name: Debra Saunders MRN: [id number] DOB: [birth year] Today's Date: 04/20/2018    History of Present Illness  72 y.o. 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 s/p cardiac cath 10/28.    PT Comments    Pt with slow progression of activity. Ambulated 250' with rollator and min-guard A, needed 2 seated rest breaks, 3 min ea. O2 sats remained in 90's on 4L O2 but not indicative of pt's RPE, pt 9/10 at beginning of each seated rest. Discussed energy conservation and self monitoring. PT will continue to follow.    Follow Up Recommendations  Home health PT;Supervision - Intermittent(resume)     Equipment Recommendations  None recommended by PT    Recommendations for Other Services       Precautions / Restrictions Precautions Precautions: Fall Precaution Comments: monitor dyspnea, chronic 4L O2 [place] (sats not indicative of how she feels) Restrictions Weight Bearing Restrictions: No    Mobility  Bed Mobility Overal bed mobility: Modified Independent             General bed mobility comments: increased time, no physical assist required, use of rails. Very winded once at EOB  Transfers Overall transfer level: Needs assistance Equipment used: 4-wheeled walker Transfers: Sit to/from Stand Sit to Stand: Supervision         General transfer comment: supervision for safety but pt did not requires physical assist and no LOB  Ambulation/Gait Ambulation/Gait assistance: Min guard Gait Distance (Feet): 250 Feet(with 2 seated rest breaks, 3 mins each) Assistive device: 4-wheeled walker Gait  Pattern/deviations: Step-through pattern;Decreased stride length;Trunk flexed Gait velocity: decreased Gait velocity interpretation: <1.31 ft/sec, indicative of household ambulator General Gait Details: encouraged pt to grade exertion with RPE scale because her sats are in upper 90's when her DOE is 3/4   Stairs             Wheelchair Mobility    Modified Rankin (Stroke Patients Only)       Balance Overall balance assessment: Needs assistance Sitting-balance support: Feet supported;No upper extremity supported Sitting balance-Leahy Scale: Good     Standing balance support: During functional activity;Bilateral upper extremity supported Standing balance-Leahy Scale: Fair Standing balance comment: needs at least single UE support                            Cognition Arousal/Alertness: Awake/alert Behavior During Therapy: WFL for tasks assessed/performed Overall Cognitive Status: Within Functional Limits for tasks assessed                                        Exercises General Exercises - Lower Extremity Ankle Circles/Pumps: AROM;Both;10 reps;Seated Long Arc Quad: AROM;Both;10 reps;Seated    General Comments        Pertinent Vitals/Pain Pain Assessment: Faces Faces Pain Scale: Hurts even more Pain Location: headace to L ear Pain Descriptors / Indicators: Aching Pain Intervention(s): Limited activity within patient's tolerance    Home Living  Prior Function            PT Goals (current goals can now be found in the care plan section) Acute Rehab PT Goals Patient Stated Goal: be able to walk faster PT Goal Formulation: With patient Time For Goal Achievement: 04/30/18 Potential to Achieve Goals: Good Progress towards PT goals: Progressing toward goals    Frequency    Min 3X/week      PT Plan Current plan remains appropriate    Co-evaluation              AM-PAC PT "6 Clicks" Daily  Activity  Outcome Measure  Difficulty turning over in bed (including adjusting bedclothes, sheets and blankets)?: None Difficulty moving from lying on back to sitting on the side of the bed? : A Little Difficulty sitting down on and standing up from a chair with arms (e.g., wheelchair, bedside commode, etc,.)?: A Little Help needed moving to and from a bed to chair (including a wheelchair)?: A Little Help needed walking in hospital room?: A Little Help needed climbing 3-5 steps with a railing? : A Lot 6 Click Score: 18    End of Session Equipment Utilized During Treatment: Gait belt;Oxygen Activity Tolerance: Patient limited by fatigue Patient left: with call bell/phone within reach;in chair Nurse Communication: Mobility status PT Visit Diagnosis: Difficulty in walking, not elsewhere classified (R26.2)     Time: 7510-2585 PT Time Calculation (min) (ACUTE ONLY): 42 min  Charges:  $Gait Training: 23-37 mins $Therapeutic Activity: 8-22 mins                     Leighton Roach, [place]  Pager [phone] Office [place] 04/20/2018, 4:55 PM

## 2018-04-20 NOTE — Discharge Summary (Signed)
Physician Discharge Summary  Debra Saunders MRN:[id number] DOB: 06/22/1946 DOA: 04/15/2018  PCP: Sonnenberg, Eric G, MD  Admit date: 04/15/2018 Discharge date: 04/20/2018  Time spent: 40 minutes  Recommendations for Outpatient Follow-up:  1. Follow up outpatient CBC/CMP 2. Follow up with pulmonology Dr. Ramaswamy for ILD - follow autoimmune, vasculitis, HP, and lupus anticoagulant (see below) 3. Ensure follow up with cardiology for pulmonary hypertension 4. Follow up repeat PFT's as outpatient 5. Rounded lucency in T8 noted on imaging, recommend follow up outpatient with PCP  Discharge Diagnoses:  Principal Problem:   acute on chronic diastolic CHF (congestive heart failure) (HCC) Active Problems:   Hypertension   Pulmonary hypertension, unspecified (HCC)   Hyperlipidemia   Hypothyroidism   Chronic respiratory failure with hypoxia (HCC)   Atrial fibrillation (HCC)   Discharge Condition: stable  Diet recommendation: heart healthy  Filed Weights   04/18/18 0513 04/19/18 0448 04/20/18 0500  Weight: 102.1 kg 98.9 kg 102.6 kg    History of present illness:  Debra Saundersis a 72 y.o.femalewith medical history significant ofdiastolic dysfunction CHF, atrial fibrillation on chronic Eliquis, history of cardiorenal syndrome requiring hemodialysis in the past, COPD, hypertension, morbid obesity, diabetes, hyperlipidemia and previous CVA who came to the ER complaining of shortness of breath and lower extremity edema. Patient has been dealing with excess fluid with worsening edema just last week. She was seen by her cardiologist who increased her oral diuretic dose a week ago. Patient did not get adequate urinary response and did not have any decrease in her fluid status. She has gained significant weight since then. She is on home O2 at 4 L and has been requiring even more. She called her cardiologist today who recommended hospital admissions with IV diuretics. Patient's  creatinine has slightly worsened over her baseline. With her history of previous cardiorenal syndrome and failing to respond to oral diuretics she is being admitted for further work-up..  She was admitted for a heart failure exacerbation.  She was seen by the heart failure team who started milrinone and diuresed with lasix.  She had RHC which showed optimized filling pressures, preserved CO, and moderate pulm HTN. She was seen by pulmonology for her ILD who recommended stopping amiodarone.    Hospital Course:  HFpEF Exacerbation:Echo from 02/2018 with normal EF, grade 1 diastolic dysfunction.  Increased PASP.  Patient has failed outpatient diuretic therapy.  Cardiology c/s appreciate recs -> s/p PICC, following CVP, starting milrinone and lasix 80 BID.  She had RHC which showed optimized filling pressures, preserved CO, and mod pulmonary hypertension.  She was discharged on PO torsemide.  Losartan and carvedilol were held on discharge.  Pulmonary Hypertension:   Continue sildenafil for PAH High res chest CT with stable fibrosis, possibly post inflammatory V/Q scan negative for PE RHC as noted below Cardiolgy c/s appreciate recs. Planning to add ERA outpatient.  Amiodarone was discontinued. Repeat PFTs (will need to be repeated as outpatient)   Interstitial Lung Disease: High res CT with stable pattern of fibrosis.  Recommending d/c amiodarone if ok with cardiology.  Pt will f/u with Dr. Ramaswamy as an outpatient.  Follow ESR (32), CRP (0.8).   Pulm c/s, appreciate recs - follow autoimmune, vasculitis, HP panels, lupus anticoagulant (notable for slightly elevated RF and negative lupus anticoagulant, but elevated DRVVT)  acute on chronic kidney disease stage III:Patient's baseline creatinine in September was 1.1. 1.16 on day of discharge (peaked to 1.84 during this admission).  hypertension: holding arb and bb   at discharge, continue spiro  atrial fibrillation  S/p pacemaker: Holding  eliquis for RHC tomorrow.  Continue amiodarone for now.  chronic respiratory failure:On home O2, baseline 4 L [place]. Continue oxygen by nasal cannula  Anemia: b12 deficiency, now on oral as noted below.  Hb has been downtrending down since admission, but stable today.  No evidence of bleeding, on discussion with nurse, she had brown normal stool.  Will continue to trend.  Follow hemoccult.      hypothyroidism:Continue levothyroxine.  morbid obesity: Dietary counseling.  B12 deficiency: follow IF ab (negative), start vitamin B12  Headache: seems like tension HA in description, continue APAP prn.  Try fioricet prn.   Rounded Lucency in T8 Vertebral Body: unclear significance, will need outpatient follow up  Hx DVT s/p IVC filter  Hx CVA  Procedures: Echo Study Conclusions  - Left ventricle: The cavity size was normal. Wall thickness was   increased in Debra Saunders pattern of moderate LVH. Systolic function was   normal. The estimated ejection fraction was in the range of 60%   to 65%. Wall motion was normal; there were no regional wall   motion abnormalities. Left ventricular diastolic function   parameters were normal. - Aortic valve: Sclerosis without stenosis. - Mitral valve: There was mild regurgitation. - Left atrium: The atrium was moderately dilated. - Pulmonary arteries: PA peak pressure: 52 mm Hg (S).  Right Heart Cath 1. Filling pressures optimized.  2. Preserved cardiac output.  3. Moderate pulmonary hypertension.  PVR is significantly lower than the last cath, 3.4 WU.   Consultations:  Cardiology  Pulmonology  Discharge Exam: Vitals:   04/20/18 1150 04/20/18 1637  BP: (!) 148/95 (!) 150/77  Pulse: 65 60  Resp: (!) 22 (!) 23  Temp: 98.1 F (36.7 C) 98.4 F (36.9 C)  SpO2: 100% 100%   Feeling better  General: No acute distress. Cardiovascular: Heart sounds show Debra Saunders regular rate, and rhythm Lungs: Clear to auscultation bilaterally  Abdomen: Soft,  nontender, nondistended  Neurological: Alert and oriented 3. Moves all extremities 4 . Cranial nerves II through XII grossly intact. Skin: Warm and dry. No rashes or lesions. Extremities: L>R LEE Psychiatric: Mood and affect are normal. Insight and judgment are appropriate.   Discharge Instructions   Discharge Instructions    Diet - low sodium heart healthy   Complete by:  As directed    Discharge instructions   Complete by:  As directed    You were seen for Debra Saunders heart failure exacerbation.  You were seen by pulmonology and cardiology.  Your amiodarone was discontinued.  Cardiology is planning to start Debra Saunders new medication for your pulmonary hypertension as an outpatient.  Please follow up labs and imaging from this hospitalization with cardiology and pulmonology.  We've discontinued your carvedilol and losartan.  Please follow up with cardiology regarding when and if you'll restart these medications.  You had Debra Saunders "rounded lucency" seen on the T8 vertebrae in your spine.  This was seen in 2016 as well and appears to be stable.  Please follow up with your PCP as an outpatient to discuss whether this should be followed any further.   You were started on B12 for vitamin B12 deficiency.  Please follow up with your PCP regarding this.   Return for new, recurrent, or worsening symptoms.  Please ask your PCP to request records from this hospitalization so they know what was done and what the next steps will be.   Increase activity slowly  Complete by:  As directed      Allergies as of 04/20/2018      Reactions   Aspirin Hives, Other (See Comments)   Pt states that she is unable to take because she had bleeding in her brain   Other Other (See Comments)   Pt states that she is unable to take blood thinners because she had bleeding in her brain; Blood Thinners-per doctor at [facility]   Penicillins Hives, Shortness Of Breath, Swelling, Other (See Comments)   Has patient had a PCN reaction  causing immediate rash, facial/tongue/throat swelling, SOB or lightheadedness with hypotension: Yes Has patient had a PCN reaction causing severe rash involving mucus membranes or skin necrosis: No Has patient had a PCN reaction that required hospitalization No Has patient had a PCN reaction occurring within the last 10 years: No If all of the above answers are "NO", then may proceed with Cephalosporin use.      Medication List    STOP taking these medications   amiodarone 200 MG tablet Commonly known as:  PACERONE   carvedilol 3.125 MG tablet Commonly known as:  COREG   losartan 25 MG tablet Commonly known as:  COZAAR     TAKE these medications   acetaminophen 325 MG tablet Commonly known as:  TYLENOL Take 325-650 mg by mouth every 6 (six) hours as needed for mild pain. Reported on 10/10/2015   albuterol (2.5 MG/3ML) 0.083% nebulizer solution Commonly known as:  PROVENTIL Take 3 mLs (2.5 mg total) by nebulization every 6 (six) hours. What changed:    when to take this  reasons to take this   apixaban 5 MG Tabs tablet Commonly known as:  ELIQUIS Take 1 tablet (5 mg total) by mouth 2 (two) times daily.   BIOFREEZE EX Apply 1 application topically as needed (for pain).   cyanocobalamin 1000 MCG tablet Take 1 tablet (1,000 mcg total) by mouth daily. Start taking on:  04/21/2018   gabapentin 100 MG capsule Commonly known as:  NEURONTIN TAKE 2 CAPSULES BY MOUTH THREE TIMES DAILY   glucose blood test strip Check blood glucose twice daily Dx:E11.8, Z79.4   metFORMIN 500 MG tablet Commonly known as:  GLUCOPHAGE Take 500 mg by mouth 2 (two) times daily with a meal.   ondansetron 8 MG tablet Commonly known as:  ZOFRAN Take 1 tablet (8 mg total) by mouth every 8 (eight) hours as needed for nausea or vomiting.   OXYGEN Inhale 4 L into the lungs continuous.   potassium chloride SA 20 MEQ tablet Commonly known as:  K-DUR,KLOR-CON Take 1 tablet (20 mEq total) by  mouth 2 (two) times daily.   rosuvastatin 10 MG tablet Commonly known as:  CRESTOR Take 1 tablet (10 mg total) by mouth daily.   sildenafil 20 MG tablet Commonly known as:  REVATIO Take 20 mg by mouth 3 (three) times daily.   spironolactone 25 MG tablet Commonly known as:  ALDACTONE Take 0.5 tablets (12.5 mg total) by mouth daily.   torsemide 20 MG tablet Commonly known as:  DEMADEX Take 3 tablets (60 mg total) by mouth 2 (two) times daily. What changed:  how much to take   [name] 0.75 MG/0.5ML Sopn Generic drug:  Dulaglutide INJECT 0.75MG SUBCUTANEOUSLY ONCE A WEEK What changed:  See the new instructions.      Allergies  Allergen Reactions  . Aspirin Hives and Other (See Comments)    Pt states that she is unable to take because she had   bleeding in her brain  . Other Other (See Comments)    Pt states that she is unable to take blood thinners because she had bleeding in her brain; Blood Thinners-per doctor at [facility]  . Penicillins Hives, Shortness Of Breath, Swelling and Other (See Comments)    Has patient had Devante Capano PCN reaction causing immediate rash, facial/tongue/throat swelling, SOB or lightheadedness with hypotension: Yes Has patient had Brannon Levene PCN reaction causing severe rash involving mucus membranes or skin necrosis: No Has patient had Shirrell Solinger PCN reaction that required hospitalization No Has patient had Sartaj Hoskin PCN reaction occurring within the last 10 years: No If all of the above answers are "NO", then may proceed with Cephalosporin use.   Follow-up Information    [facility] Follow up.   Specialty:  [place] Why:  They will do your home health care at your home Contact information: [place] [address] [phone]        [facility]. Go on 04/27/2018.   Specialty:  Cardiology Why:  at 10:00 AM in the [facility].  Please bring all medications to appt.  [place] code 1800  for November. Contact information: [address] [phone] [place] [phone]           The results of significant diagnostics from this hospitalization (including imaging, microbiology, ancillary and laboratory) are listed below for reference.    Significant Diagnostic Studies: Dg Chest 2 View  Result Date: 04/15/2018 CLINICAL DATA:  Acute shortness of breath for 2 days. EXAM: CHEST - 2 VIEW COMPARISON:  03/08/2018 FINDINGS: Cardiomegaly and LEFT-sided pacemaker again noted. RIGHT hemidiaphragm elevation is identified. Pulmonary vascular congestion noted with interstitial and patchy airspace opacities. No pleural effusion or pneumothorax. No acute bony abnormality identified. IMPRESSION: Cardiomegaly with pulmonary vascular congestion with interstitial and patchy airspace opacities. These opacities are favored to represent edema but infection/pneumonia is not excluded. Electronically Signed   By: Margarette Canada M.D.   On: 04/15/2018 17:04   Ct Chest High Resolution  Result Date: 04/19/2018 CLINICAL DATA:  Pulmonary fibrosis. EXAM: CT CHEST WITHOUT CONTRAST TECHNIQUE: Multidetector CT imaging of the chest was performed following the standard protocol without intravenous contrast. High resolution imaging of the lungs, as well as inspiratory and expiratory imaging, was performed. COMPARISON:  Multiple prior exams dating back 01/16/2013. FINDINGS: Cardiovascular: Right PICC terminates in the high right atrium. Atherosclerotic calcification of the arterial vasculature, including coronary arteries and aortic valve. Enlarged pulmonary arteries. Heart is enlarged. No pericardial effusion. Mediastinum/Nodes: Calcified mediastinal lymph nodes. Hilar regions are difficult to evaluate without IV contrast. No axillary adenopathy. Esophagus is grossly unremarkable. Lungs/Pleura: Patchy bilateral parenchymal ground-glass, bronchiectasis and architectural distortion,  somewhat upper lobe predominant. Findings appear grossly similar to 03/27/2015. Associated air trapping. No honeycombing. Subpleural reticulation is minimal. Calcified granuloma in the medial left lower lobe. No pleural fluid. Airway is unremarkable. Upper Abdomen: Visualized portion of the liver is unremarkable. Biliary ductal dilatation likely relates to cholecystectomy. Visualized portions of the adrenal glands, kidneys, spleen, pancreas, stomach and bowel are grossly unremarkable. No upper abdominal adenopathy. Musculoskeletal: Degenerative changes in the spine. Debra Saunders 1.4 cm low-attenuation lesion in the T8 vertebral body is unchanged from 08/16/2017 but is questionably present exams prior to that. IMPRESSION: 1. Pulmonary parenchymal pattern of fibrosis is stable and may be post inflammatory in etiology. Chronic hypersensitivity pneumonitis is another consideration. Findings are suggestive of an alternative  diagnosis (not UIP) per consensus guidelines: Diagnosis of Idiopathic Pulmonary Fibrosis: An Official ATS/ERS/JRS/ALAT Clinical Practice Guideline. Am J Respir Crit Care Med Vol 198, Iss 5, [id number], Feb 21 2017. 2. Rounded lucency in the T8 vertebral body, stable from 08/16/2017 but questionably present on 03/27/2015. Consider MR thoracic spine without and with contrast in further evaluation, as clinically indicated. Aortic atherosclerosis (ICD10-170.0). Coronary artery calcification. 3. Enlarged pulmonary arteries, indicative of pulmonary arterial hypertension. Electronically Signed   By: Melinda  Blietz M.D.   On: 04/19/2018 10:33   Nm Pulmonary Perf And Vent  Result Date: 04/17/2018 CLINICAL DATA:  Chronic respiratory failure and pulmonary hypertension. EXAM: NUCLEAR MEDICINE VENTILATION - PERFUSION LUNG SCAN TECHNIQUE: Ventilation images were obtained in multiple projections using inhaled aerosol Tc-99m DTPA. Perfusion images were obtained in multiple projections after intravenous injection of  Tc-99m-MAA. RADIOPHARMACEUTICALS:  31.2 mCi of Tc-99m DTPA aerosol inhalation and 4.2 mCi Tc99m-MAA IV COMPARISON:  Chest CT 04/17/2018 FINDINGS: Ventilation: No focal ventilation defect. Perfusion: No wedge shaped peripheral perfusion defects to suggest acute pulmonary embolism. IMPRESSION: Negative V/Q scan for pulmonary embolism. Electronically Signed   By: P.  Gallerani M.D.   On: 04/17/2018 11:17   Us Ekg Site Rite  Result Date: 04/16/2018 If Site Rite image not attached, placement could not be confirmed due to current cardiac rhythm.   Microbiology: Recent Results (from the past 240 hour(s))  MRSA PCR Screening     Status: None   Collection Time: 04/15/18 11:23 PM  Result Value Ref Range Status   MRSA by PCR NEGATIVE NEGATIVE Final    Comment:        [name] (FDA approved for NASAL specimens only), is one component of a comprehensive MRSA colonization surveillance program. It is not intended to diagnose MRSA infection nor to guide or monitor treatment for MRSA infections. Performed at [facility], [address] [place], [place] [address]      Labs: Basic Metabolic Panel: Recent Labs  Lab 04/16/18 0611 04/17/18 0456 04/18/18 0441 04/19/18 0417 04/20/18 0426  NA 142 140 141 141 139  K 3.6 3.5 3.4* 3.9 4.0  CL 99 99 102 102 97*  CO2 32 31 32 31 32  GLUCOSE 108* 130* 136* 124* 120*  BUN 45* 45* 33* 26* 23  CREATININE 1.75* 1.64* 1.25* 1.03* 1.16*  CALCIUM 9.4 8.9 8.7* 8.5* 8.9  MG  --  2.2 2.3  --  2.0   Liver Function Tests: Recent Labs  Lab 04/15/18 1618 04/16/18 0611 04/19/18 0417  AST 27 19 17  ALT 17 16 13  ALKPHOS 106 94 82  BILITOT 0.7 0.7 0.6  PROT 7.2 6.5 6.0*  ALBUMIN 3.7 3.3* 3.1*   No results for input(s): LIPASE, AMYLASE in the last 168 hours. No results for input(s): AMMONIA in the last 168 hours. CBC: Recent Labs  Lab 04/16/18 0611 04/17/18 0456 04/18/18 0441 04/18/18 1519 04/19/18 0417 04/20/18 0426   WBC 6.0 5.4 5.3  --  5.3 5.4  NEUTROABS 3.3  --   --   --  2.9  --   HGB 9.5* 9.3* 8.2* 7.8* 8.9* 9.3*  HCT 33.1* 31.5* 29.5* 27.1* 31.1* 31.9*  MCV 89.2 88.2 91.3  --  91.2 90.1  PLT 210 205 196  --  191 185   Cardiac Enzymes: Recent Labs  Lab 04/17/18 0456  CKTOTAL 72  CKMB 1.2   BNP: BNP (last 3 results) Recent Labs    10/28/17 0350 11/05/17 1514   04/15/18 1618  BNP 290.0* 989.0* 35.8    ProBNP (last 3 results) No results for input(s): PROBNP in the last 8760 hours.  CBG: Recent Labs  Lab 04/19/18 1144 04/19/18 1643 04/19/18 2225 04/20/18 0734 04/20/18 1152  GLUCAP 110* 173* 107* 105* 126*       Signed:  Caldwell Powell MD.  [facility] 04/20/2018, 4:43 PM   

## 2018-04-21 ENCOUNTER — Telehealth: Payer: Self-pay | Admitting: *Deleted

## 2018-04-21 LAB — LUPUS ANTICOAGULANT PANEL
DRVVT: 66.3 s — ABNORMAL HIGH (ref 0.0–47.0)
PTT Lupus Anticoagulant: 40.4 s (ref 0.0–51.9)

## 2018-04-21 LAB — HYPERSENSITIVITY PNEUMONITIS
A. Pullulans Abs: NEGATIVE
A.Fumigatus #1 Abs: NEGATIVE
Micropolyspora faeni, IgG: NEGATIVE
Pigeon Serum Abs: NEGATIVE
Thermoact. Saccharii: NEGATIVE
Thermoactinomyces vulgaris, IgG: NEGATIVE

## 2018-04-21 LAB — DRVVT CONFIRM: dRVVT Confirm: 1 ratio (ref 0.8–1.2)

## 2018-04-21 LAB — DRVVT MIX: dRVVT Mix: 48.6 s — ABNORMAL HIGH (ref 0.0–47.0)

## 2018-04-21 NOTE — Telephone Encounter (Signed)
Received fax from [employer] Patient assistance foundation that patient is approved to receive Eliquis free of charge from 04/21/2018 though 06/22/2018.

## 2018-04-22 ENCOUNTER — Telehealth: Payer: Self-pay

## 2018-04-22 ENCOUNTER — Inpatient Hospital Stay: Payer: Medicare HMO | Admitting: Oncology

## 2018-04-22 NOTE — Progress Notes (Signed)
Advanced Heart Failure Clinic Note   PCP: [name], MD PCP-Cardiologist: [name], MD  HF: Dr [name]  HPI: Debra Saunders a 72 y.o.femalewith a history of chronic diastolic heart failure, nonobstructive CAD, afib on eliquis, bradycardia s/p PPM([employer]), chronic respiratory failure on home O2, COPD, PAH, DM2, HTN, CKD (required HD in past),DVT s/p IVC filter2004, HL, traumatic subarachnoic hemorrhage while on xarelto2014, CVA2014, OSA,and chronic lymphedema.  Admitted 58/52-77/82/42 with A/C diastolic HF. HF team consulted with concerns for low output. PICC was placed and milrinone started along with IV lasix to facilitate diuresis. RHC done to evaluate PAH (see below). V/Q scan negative. High res CT showed pulmonary fibrosis. Pulmonary consult and recommended stopping amio. She was transitioned to torsemide 60 mg BID. DC weight: 226 lbs.   She returns today for post hospital follow up. Overall doing poorly. She felt like she was going to pass out walking from waiting room. She is not urinating much with torsemide, only once after each dose. This has tapered off since discharge. She is SOB with minimal exertion, but no worse than baseline. Dizzy with walking. Poor appetite and energy level. No orthopnea, PND, or edema. No palpitations. No bleeding on Eliquis. Wearing CPAP qHS. Wearing 4 L O2 at all times. Weights 220 > 222 lbs on home scale, but down 1 lb since DC on our scale. Taking all medications. Limiting fluid and salt intake. Followed by [facility] PT and RN.   [employer]: no AF/AT. No heart logic data.  Echo 04/16/18: - Left ventricle: The cavity size was normal. Wall thickness was   increased in a pattern of moderate LVH. Systolic function was   normal. The estimated ejection fraction was in the range of 60%   to 65%. Wall motion was normal; there were no regional wall   motion abnormalities. Left ventricular diastolic function  parameters were normal. - Aortic valve: Sclerosis without stenosis. - Mitral valve: There was mild regurgitation. - Left atrium: The atrium was moderately dilated. - Pulmonary arteries: PA peak pressure: 52 mm Hg (S).  RHC 04/19/18: Hemodynamics (mmHg) RA mean 6 RV 57/11 PA 56/17, mean 33 PCWP mean 12 Oxygen saturations: PA 65% AO 99% Cardiac Output (Fick) 6.26  Cardiac Index (Fick) 3.23 PVR 3.4 WU  FH: mother with CHF  SH: Former smoker 1 pack/week x 2-3 years, quit 28 years ago. No ETOH or drug use. 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 Lives alone. Transportation is an issue.   Review of systems complete and found to be negative unless listed in HPI.   Past Medical History:  Diagnosis Date  . Arthritis   . Atrial fibrillation, persistent    a. s/p TEE-guided DCCV on 08/20/2017  . Cardiorenal syndrome    a. 09/2017 Creat rose to 2.7 w/ diuresis-->HD x 2.  . Chest pain    a. 01/2012 Cath: nonobs dzs;  c. 04/2014 MV: No ischemia.  . Chronic back pain   . Chronic combined systolic (congestive) and diastolic (congestive) heart failure ([place])    a. Dx 2005 @ [facility];  b. 02/2014 Echo: Ef 55-60%, no rwma, mild MR, PASP 38mHg. c. 07/2017: EF 30-35%, diffuse HK, trivial AI, severe MR, moderate TR; d. 09/2017 Echo: EF 40-45%, ant, antsep, ap, basal HK; e. 02/2018 Echo: EF 55-60%, Gr1 DD. Ao sclerosis w/o stenosis. Mild to mod MR. Mildly dil LA. Mildly reduced RV fxn. Mild TR. PASP 35-494mg.  . Marland Kitchenhronic respiratory failure ([place]   a.  on home O2  . COPD (chronic obstructive pulmonary disease) ([place])    a. Home O2 - 3lpm  . Gastritis   . History of intracranial hemorrhage    a. 6/14 described by neurosurgery as "small frontal posttraumatic SAH " - pt was on xarelto @ the time.  . Hyperlipidemia   . Hypertension   . Lipoma of back   . Lymphedema    a. Chronic LLE edema.  [name] NICM (nonischemic cardiomyopathy) ([place])    a. 09/2017 Echo: EF 40-45%; b. 09/2017 Cath: minor,  insignificant CAD; c. 02/2018 Echo: EF 55-60%, Gr1 DD.  [name] Obesity   . PAH (pulmonary artery hypertension) ([place])    a. 09/2017 Echo: PASP 66mHg; b. 09/2017 RHC: RA 23, RV 84/13, PA 84/29, PCWP 20, CO 3.89, CI 1.89. LVEDP 18.  . Sepsis with metabolic encephalopathy ([place]   . Sleep apnea   . Streptococcal bacteremia   . Stroke ([place]   . Symptomatic bradycardia    a. s/p BSX PPM.  . Thyroid nodule   . Type II diabetes mellitus ([place]   . Urinary incontinence     Current Outpatient Medications  Medication Sig Dispense Refill  . acetaminophen (TYLENOL) 325 MG tablet Take 325-650 mg by mouth every 6 (six) hours as needed for mild pain. Reported on 10/10/2015    . albuterol (PROVENTIL) (2.5 MG/3ML) 0.083% nebulizer solution Take 3 mLs (2.5 mg total) by nebulization every 6 (six) hours. 75 mL 12  . apixaban (ELIQUIS) 5 MG TABS tablet Take 1 tablet (5 mg total) by mouth 2 (two) times daily. 60 tablet 0  . cyanocobalamin 1000 MCG tablet Take 1 tablet (1,000 mcg total) by mouth daily. 30 tablet 0  . gabapentin (NEURONTIN) 100 MG capsule TAKE 2 CAPSULES BY MOUTH THREE TIMES DAILY 180 capsule 3  . glucose blood (TRUE METRIX BLOOD GLUCOSE TEST) test strip Check blood glucose twice daily Dx:E11.8, Z79.4 100 each 12  . Menthol, Topical Analgesic, (BIOFREEZE EX) Apply 1 application topically as needed (for pain).     . metFORMIN (GLUCOPHAGE) 500 MG tablet Take 500 mg by mouth 2 (two) times daily with a meal.     . ondansetron (ZOFRAN) 8 MG tablet Take 1 tablet (8 mg total) by mouth every 8 (eight) hours as needed for nausea or vomiting. 30 tablet 2  . OXYGEN Inhale 4 L into the lungs continuous.    . potassium chloride SA (K-DUR,KLOR-CON) 20 MEQ tablet Take 1 tablet (20 mEq total) by mouth 2 (two) times daily. 180 tablet 3  . rosuvastatin (CRESTOR) 10 MG tablet Take 1 tablet (10 mg total) by mouth daily. 90 tablet 3  . sildenafil (REVATIO) 20 MG tablet Take 20 mg by mouth 3 (three) times daily.    .[name] torsemide (DEMADEX) 20 MG tablet Take 3 tablets (60 mg total) by mouth 2 (two) times daily. 1092tablet 0  . [name] 03.30MQT/6.2UQSOPN INJECT 0.75MG SUBCUTANEOUSLY ONCE A WEEK 4 pen 2  . spironolactone (ALDACTONE) 25 MG tablet Take 0.5 tablets (12.5 mg total) by mouth daily. 45 tablet 3   No current facility-administered medications for this encounter.     Allergies  Allergen Reactions  . Aspirin Hives and Other (See Comments)    Pt states that she is unable to take because she had bleeding in her brain  . Other Other (See Comments)    Pt states that she is unable to take blood thinners because she had bleeding in her brain; Blood  Thinners-per doctor at [facility]  . Penicillins Hives, Shortness Of Breath, Swelling and Other (See Comments)    Has patient had a PCN reaction causing immediate rash, facial/tongue/throat swelling, SOB or lightheadedness with hypotension: Yes Has patient had a PCN reaction causing severe rash involving mucus membranes or skin necrosis: No Has patient had a PCN reaction that required hospitalization No Has patient had a PCN reaction occurring within the last 10 years: No If all of the above answers are "NO", then may proceed with Cephalosporin use.      Social History   Socioeconomic History  . Marital status: Divorced    Spouse name: Not on file  . Number of children: 4  . Years of education: 61  . Highest education level: High school graduate  Occupational History  . Occupation: Retired- factory work    Comment: exposed to [place]  . Financial resource strain: Not hard at all  . Food insecurity:    Worry: Never true    Inability: Never true  . Transportation needs:    Medical: Yes    Non-medical: Yes  Tobacco Use  . Smoking status: Former Smoker    Packs/day: 0.30    Years: 2.00    Pack years: 0.60    Types: Cigarettes    Last attempt to quit: 06/23/1990    Years since quitting: 27.8  . Smokeless tobacco: Never Used    Substance and Sexual Activity  . Alcohol use: No  . Drug use: No  . Sexual activity: Not Currently  Lifestyle  . Physical activity:    Days per week: 0 days    Minutes per session: 0 min  . Stress: To some extent  Relationships  . Social connections:    Talks on phone: More than three times a week    Gets together: Once a week    Attends religious service: Never    Active member of club or organization: No    Attends meetings of clubs or organizations: Never    Relationship status: Divorced  . Intimate partner violence:    Fear of current or ex partner: Patient refused    Emotionally abused: Patient refused    Physically abused: Patient refused    Forced sexual activity: Patient refused  Other Topics Concern  . Not on file  Social History Narrative   Lives in [place] with daughter. Has 4 children. No pets.      Work - Restaurant manager, fast food, retired      Diet - regular      Family History  Problem Relation Age of Onset  . Heart disease Mother   . Hypertension Mother   . COPD Mother        was a smoker  . Thyroid disease Mother   . Hypertension Father   . Hypertension Sister   . Thyroid disease Sister   . Hypertension Sister   . Thyroid disease Sister   . Breast cancer Maternal Aunt   . Kidney disease Neg Hx   . Bladder Cancer Neg Hx     Vitals:   04/27/18 1005  BP: 116/82  Pulse: 63  SpO2: 97%  Weight: 102.4 kg (225 lb 12.8 oz)   Wt Readings from Last 3 Encounters:  04/27/18 102.4 kg (225 lb 12.8 oz)  04/20/18 102.6 kg (226 lb 3.1 oz)  04/15/18 100.7 kg (222 lb)    PHYSICAL EXAM: General:  Appears fatigued and SOB. No respiratory difficulty HEENT: normal Neck: supple.  JVP ~8. Carotids 2+ bilat; no bruits. No lymphadenopathy or thyromegaly appreciated. Cor: PMI nondisplaced. Regular rate & rhythm. No rubs, gallops or murmurs. Lungs: clear. On 4 L  Abdomen: soft, nontender, nondistended. No hepatosplenomegaly. No bruits or masses. Good bowel  sounds. Extremities: no cyanosis, clubbing, rash, RLE no edema. LLE nonpitting edema (always larger per patient) Neuro: alert & oriented x 3, cranial nerves grossly intact. moves all 4 extremities w/o difficulty. Affect pleasant.  ECG: AV paced with occ PVCs, 65 bpm. Personally reviewed.    ASSESSMENT & PLAN:  1. 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 Echo this admission showed EF 60-65% with moderate LVH, RV was difficult to see. Based on cath from 4/19, severe primarily pulmonary artery HTN with PVR 7.2 WU. Filling pressures optimized on RHC, cardiac output preserved.  Moderate pulmonary arterial hypertension.  Required milrinone for diuresis while inpatient.  - NYHA IIIb - Volume okay on exam. - Continue torsemide 60 mg bid. BMET today.  - Continue spiro 12.5 mg daily - Start digoxin 0.125 mg daily 2. Pulmonary hypertension: She was [facility] in the past at [facility].  Based on 4/19 cath, she has severe PAH with PVR 7.2 WU. She has been on sildenafil 20 mg tid x 4 years. At one point, she was on ERA, thought this helped. Chronic hypoxemic respiratory failure, on home oxygen. Prior CTA chest from 2/19 showed no PE but there was evidence for interstitial fibrosis with mild bronchiectasis. She was not a heavy smoker. She also has a history of remote DVT (2004) but V/Q scan negative for evidence of chronic PE. Dr. Chase Caller has seen, concerned for possible burnt-out sarcoidosis as cause of her interstitial fibrosis and hilar adenopathy.  She has family history of this.  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  ACE not elevated, ESR only 32.  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  ESR not high.  RHC showed moderate pulmonary hypertension, PVR 3.4 WU which is significantly lower than on the 4/19 cath.  - High resolution CT chest shows pulmonary fibrosis, not suggestive of sarcoidosis.  Possible amiodarone lung  toxicity, pulmonary has recommended stopping amiodarone. Dr Chase Caller will follow her outpatient.  - Continue sildenafil 20 mg TID - Add macitentan 10 mg daily.  - She needs to follow up with Dr Chase Caller.  Will refer her today.  - Refer to pulmonary rehab (will have to wait to go until [facility] is done) 3. CKD stage 3 with recent AKI: Creatinine peaked at 1.85. Concern for cardiorenal syndrome.  Creatinine improved on milrinone. Check BMET today. 4. Atrial fibrillation:  - Continue apixiban. Denies bleeding. Off amio per pulmonary - EKG today shows AV pacing - No AF/AT on device interrogation.  5. OSA: Continue CPAP.Remains compliant.   BMET, BNP today Start digoxin 0.125 mg daily Add macitentan 10 mg daily  Refer to Dr Chase Caller Refer to pulmonary rehab at [facility] (to start once [facility] completed) Follow up 2-3 weeks Discussed the above with Dr [name].  Georgiana Shore, NP 04/27/18  Greater than 50% of the 25 minute visit was spent in counseling/coordination of care regarding disease state education, salt/fluid restriction, sliding scale diuretics, and medication compliance.

## 2018-04-22 NOTE — Telephone Encounter (Signed)
Spoke with patient regarding transitional care management.  States she is doing well.  PT has reevaluated her. Home health RN expected.  Declines in office hospital follow up with pcp at this time due to being followed so closely by cardiology and multiple visits to the heart care center. Encouraged to call the office if there is anything she believes we can do to assist in her recovery.

## 2018-04-23 ENCOUNTER — Other Ambulatory Visit: Payer: Self-pay | Admitting: Pharmacy Technician

## 2018-04-23 ENCOUNTER — Telehealth: Payer: Self-pay | Admitting: Family Medicine

## 2018-04-23 ENCOUNTER — Other Ambulatory Visit: Payer: Self-pay

## 2018-04-23 NOTE — Telephone Encounter (Signed)
It is okay to give verbal orders.  Thanks.

## 2018-04-23 NOTE — Patient Outreach (Signed)
[place] [facility]) Care Management  04/23/2018  Debra Saunders [birth year] [id number]   Follow up call to [place] to check status of patient application for [name]. Margretta Ditty confirmed that patient has been approved as of 04/21/2018 until 06/22/2018. She also stated that patient should received medication in 5-10 business days.   Will follow up with patient in 7-10 business days to confirm medication has been received.  Maud Deed Beola Cord Certified Pharmacy Technician [facility] Care Management [phone]

## 2018-04-23 NOTE — Telephone Encounter (Signed)
Called and left a VM to call back.

## 2018-04-23 NOTE — Telephone Encounter (Signed)
Copied from [place] [id number]. Topic: Quick Communication - Home Health Verbal Orders >> Apr 23, 2018  2:18 PM Sheran Luz wrote: Caller/Agency: Particia Lather Number: [phone]   Requesting orders for disease process information and oversight and would also like to know if Dr. [name] would be okay with resuming skilled nursing.   Ok to leave Vm.

## 2018-04-23 NOTE — Telephone Encounter (Signed)
Sent to PCP for the OK for verbal orders  

## 2018-04-23 NOTE — Patient Outreach (Signed)
[facility]) Care Management  04/23/2018  Debra Saunders July 03, 1945 [id number]  EMMI: heart failure red alert Referral date: 04/23/18 Referral reason: scheduled a follow up appointment: no Insurance: [employer] Day # 1  Telephone call to patient regarding EMMI heart failure red alert. HIPAA verified. Explained reason for call. Patient states she has not scheduled a follow up visit with her primary MD as of yet. Patient reports receiving a call from her primary MD office on yesterday but states she was,"out of it."  Patient states she will have to call the office back to schedule and appointment. Patient states she has an appointment with the gastroenterologist on 04/26/18 and has an appointment at the heart failure clinic on 04/27/18.  Patient states she is having home health services with [facility]. Patient reports she has been contacted and seen by physical therapy.  Patient reports having her medications and taking them as prescribed. She states she has transportation to her appointments.  Patient states she is using her home oxygen 24/7.  Patient states she continues to weigh herself daily.  RNCM discussed heart failure symptoms with patient. Advised patient to contact her doctor for non emergent symptoms and call 911 for more severe symptoms. Patient verbalized understanding. Patient confirms she has contact information for [facility] care management and 24 hour nurse advise line. Patient verbally agreed to ongoing EMMI automated calls. RNCM discussed and offered [facility] care management services.  Patient states she will continue with [facility] home health services and EMMI automated calls.  Patient declined [facility] care management services at this time.   PLAN: RNCM will close patient due to refusal of services RNCM will send closure letter to patients primary MD   Quinn Plowman RN,BSN,CCM [facility] Telephonic  [phone]

## 2018-04-23 NOTE — Patient Outreach (Signed)
Patient triggered Red on [place] Heart Failure Dashboard, notification sent to:  Quinn Plowman, RN

## 2018-04-26 ENCOUNTER — Telehealth: Payer: Self-pay | Admitting: Family Medicine

## 2018-04-26 DIAGNOSIS — D649 Anemia, unspecified: Secondary | ICD-10-CM | POA: Diagnosis not present

## 2018-04-26 DIAGNOSIS — D125 Benign neoplasm of sigmoid colon: Secondary | ICD-10-CM | POA: Diagnosis not present

## 2018-04-26 DIAGNOSIS — K740 Hepatic fibrosis: Secondary | ICD-10-CM | POA: Diagnosis not present

## 2018-04-26 DIAGNOSIS — K228 Other specified diseases of esophagus: Secondary | ICD-10-CM | POA: Diagnosis not present

## 2018-04-26 NOTE — Telephone Encounter (Signed)
Sent to PCP for the OK for verbal orders  

## 2018-04-26 NOTE — Telephone Encounter (Signed)
Copied from [place] [id number]. Topic: Quick Communication - Home Health Verbal Orders >> Apr 26, 2018 12:39 PM Bea Graff, NT wrote: Caller/Agency: Wes- [facility] Number: [phone] Requesting OT/PT/Skilled Nursing/Social Work: PT Frequency: 2 times a week for 1 week and 1 time a week for 1 week.

## 2018-04-26 NOTE — Telephone Encounter (Signed)
Called and spoke with [place]. [place] for verbal orders have been given. Cindy voiced understanding.

## 2018-04-26 NOTE — Telephone Encounter (Signed)
Ok to give verbal orders?

## 2018-04-26 NOTE — Telephone Encounter (Signed)
Called and spoke with Wes. Wes was given the OK for verbal orders and voiced understanding.

## 2018-04-27 ENCOUNTER — Other Ambulatory Visit: Payer: Self-pay | Admitting: Nurse Practitioner

## 2018-04-27 ENCOUNTER — Encounter (HOSPITAL_COMMUNITY): Payer: Self-pay

## 2018-04-27 ENCOUNTER — Other Ambulatory Visit (HOSPITAL_COMMUNITY): Payer: Self-pay

## 2018-04-27 ENCOUNTER — Other Ambulatory Visit: Payer: Self-pay

## 2018-04-27 ENCOUNTER — Ambulatory Visit (HOSPITAL_COMMUNITY)
Admission: RE | Admit: 2018-04-27 | Discharge: 2018-04-27 | Disposition: A | Discharge status: Home / Self Care | Payer: Medicare HMO | Source: Ambulatory Visit | Attending: Cardiology | Admitting: Cardiology

## 2018-04-27 VITALS — BP 116/82 | HR 63 | Wt 225.8 lb

## 2018-04-27 DIAGNOSIS — Z79899 Other long term (current) drug therapy: Secondary | ICD-10-CM | POA: Diagnosis not present

## 2018-04-27 DIAGNOSIS — R6889 Other general symptoms and signs: Secondary | ICD-10-CM | POA: Diagnosis not present

## 2018-04-27 DIAGNOSIS — N183 Chronic kidney disease, stage 3 unspecified: Secondary | ICD-10-CM

## 2018-04-27 DIAGNOSIS — I5042 Chronic combined systolic (congestive) and diastolic (congestive) heart failure: Secondary | ICD-10-CM | POA: Insufficient documentation

## 2018-04-27 DIAGNOSIS — Z880 Allergy status to penicillin: Secondary | ICD-10-CM | POA: Insufficient documentation

## 2018-04-27 DIAGNOSIS — I2721 Secondary pulmonary arterial hypertension: Secondary | ICD-10-CM

## 2018-04-27 DIAGNOSIS — Z9989 Dependence on other enabling machines and devices: Secondary | ICD-10-CM | POA: Diagnosis not present

## 2018-04-27 DIAGNOSIS — Z87891 Personal history of nicotine dependence: Secondary | ICD-10-CM | POA: Diagnosis not present

## 2018-04-27 DIAGNOSIS — J9611 Chronic respiratory failure with hypoxia: Secondary | ICD-10-CM | POA: Diagnosis not present

## 2018-04-27 DIAGNOSIS — Z7984 Long term (current) use of oral hypoglycemic drugs: Secondary | ICD-10-CM | POA: Diagnosis not present

## 2018-04-27 DIAGNOSIS — N179 Acute kidney failure, unspecified: Secondary | ICD-10-CM | POA: Diagnosis not present

## 2018-04-27 DIAGNOSIS — J449 Chronic obstructive pulmonary disease, unspecified: Secondary | ICD-10-CM | POA: Insufficient documentation

## 2018-04-27 DIAGNOSIS — Z886 Allergy status to analgesic agent status: Secondary | ICD-10-CM | POA: Insufficient documentation

## 2018-04-27 DIAGNOSIS — G4733 Obstructive sleep apnea (adult) (pediatric): Secondary | ICD-10-CM | POA: Insufficient documentation

## 2018-04-27 DIAGNOSIS — I480 Paroxysmal atrial fibrillation: Secondary | ICD-10-CM | POA: Diagnosis not present

## 2018-04-27 DIAGNOSIS — E785 Hyperlipidemia, unspecified: Secondary | ICD-10-CM | POA: Diagnosis not present

## 2018-04-27 DIAGNOSIS — I272 Pulmonary hypertension, unspecified: Secondary | ICD-10-CM | POA: Diagnosis not present

## 2018-04-27 DIAGNOSIS — I130 Hypertensive heart and chronic kidney disease with heart failure and stage 1 through stage 4 chronic kidney disease, or unspecified chronic kidney disease: Secondary | ICD-10-CM | POA: Diagnosis not present

## 2018-04-27 DIAGNOSIS — Z8249 Family history of ischemic heart disease and other diseases of the circulatory system: Secondary | ICD-10-CM | POA: Diagnosis not present

## 2018-04-27 DIAGNOSIS — I5032 Chronic diastolic (congestive) heart failure: Secondary | ICD-10-CM | POA: Diagnosis not present

## 2018-04-27 DIAGNOSIS — E1122 Type 2 diabetes mellitus with diabetic chronic kidney disease: Secondary | ICD-10-CM | POA: Diagnosis not present

## 2018-04-27 DIAGNOSIS — Z8673 Personal history of transient ischemic attack (TIA), and cerebral infarction without residual deficits: Secondary | ICD-10-CM | POA: Insufficient documentation

## 2018-04-27 LAB — BASIC METABOLIC PANEL
Anion gap: 10 (ref 5–15)
BUN: 26 mg/dL — ABNORMAL HIGH (ref 8–23)
CO2: 28 mmol/L (ref 22–32)
Calcium: 9.8 mg/dL (ref 8.9–10.3)
Chloride: 102 mmol/L (ref 98–111)
Creatinine, Ser: 1.21 mg/dL — ABNORMAL HIGH (ref 0.44–1.00)
GFR calc Af Amer: 50 mL/min — ABNORMAL LOW (ref >60)
GFR calc non Af Amer: 43 mL/min — ABNORMAL LOW (ref >60)
Glucose, Bld: 135 mg/dL — ABNORMAL HIGH (ref 70–99)
Potassium: 4.1 mmol/L (ref 3.5–5.1)
Sodium: 140 mmol/L (ref 135–145)

## 2018-04-27 LAB — BRAIN NATRIURETIC PEPTIDE: B Natriuretic Peptide: 89.5 pg/mL (ref 0.0–100.0)

## 2018-04-27 MED ORDER — TORSEMIDE 20 MG PO TABS: 60.0000 mg | ORAL_TABLET | Freq: Two times a day (BID) | ORAL | 0 refills | Status: DC

## 2018-04-27 MED ORDER — DIGOXIN 125 MCG PO TABS: 0.1250 mg | ORAL_TABLET | Freq: Every day | ORAL | 3 refills | Status: DC

## 2018-04-27 MED ORDER — MACITENTAN 10 MG PO TABS: 10.0000 mg | ORAL_TABLET | Freq: Every day | ORAL | 3 refills | Status: DC

## 2018-04-27 NOTE — Progress Notes (Signed)
Bp: 164/92, pulse: 60, resp: 18, spo2: 98, todays weight: 222 lbs  Debra Saunders states she is tired all the time.  She recently was in the hospital and advised she just needs time to recoup.  She is compliant with her meds.  She had some med changes at [facility] HF clinic today, she is getting her daughter to pick them up.  Meds verified and med box filled.  Lungs clear, swelling in extremities and more in left.  Her weight is still up from her dry weight. She states Torsemide is not making her urinate like it was, She also advised HF clinic today also.  She watches what she eats.  She is aware of her eliquis and [name] coming by mail.  She denies shortness of breath at rest, only with activity.  She states her legs are hurting especially at night.  Not sleeping well.  Appetite is good. Contacted [facility] and they will be contacting her for appts to come back out.  Will continue to visit weekly to educate on heart failure.   [place] [phone]

## 2018-04-27 NOTE — Telephone Encounter (Signed)
*  STAT* If patient is at the pharmacy, call can be transferred to refill team.   1. Which medications need to be refilled? (please list name of each medication and dose if known) torsemide  2. Which pharmacy/location (including street and city if local pharmacy) is medication to be sent to? [place]  3. Do they need a 30 day or 90 day supply? [place]

## 2018-04-27 NOTE — Patient Instructions (Signed)
START Digoxin 0.125mg  (1 tab) daily  Your physician recommends you start Macitentan 10mg  (1 tab) daily for your pulmonary hypertension. Office will work on Nature conservation officer and contact you after.  Labs drawn today. Office will contact you if results are abnormal.  You have been referred to [facility] Pulmonary with Dr. Chase Caller.  You have been referred to Pulmonary Rehab. They will contact you to arrange your visits.   Your physician recommends that you schedule a follow-up appointment in: 2-3 weeks in clinic with NP/PA.

## 2018-04-30 ENCOUNTER — Encounter (HOSPITAL_COMMUNITY): Payer: Medicare HMO | Admitting: Internal Medicine

## 2018-04-30 ENCOUNTER — Other Ambulatory Visit: Payer: Self-pay | Admitting: Pharmacist

## 2018-04-30 NOTE — Patient Outreach (Signed)
[facility]) Care Management  04/30/2018  EIKO MCGOWEN 05-Dec-1945 [id number]  Successful outreach with Ms. Wadas today. HIPAA identified x2.   Medication Assistance: Patient is has been approved for [name] made by [employer] and prescribed by Dr. [name] and Eliquis made by [employer]MS and prescribed by Dr. Audelia Acton. Both medications approved 04/21/18 and expires 06/22/18.  Ms. Ruark states that she has received her Eliquis. She has not yet received the [name]   Follow up: [place] team to follow up withing 5-7 business days to ensure patient has received her [name]. [facility] pharmacy case will be closed at that time.

## 2018-05-04 ENCOUNTER — Other Ambulatory Visit (HOSPITAL_COMMUNITY): Payer: Self-pay

## 2018-05-04 ENCOUNTER — Other Ambulatory Visit: Payer: Self-pay

## 2018-05-04 ENCOUNTER — Encounter (HOSPITAL_COMMUNITY): Payer: Self-pay

## 2018-05-04 NOTE — Patient Outreach (Signed)
[place] [facility]) Care Management  05/04/2018  Debra Saunders [birth year] [id number]   EMMI- Heart Failure RED ON EMMI ALERT Day # 12 Date: 05/03/18  Red Alert Reason:  Weight 223 lbs  Outreach attempt: No answer.  HIPAA compliant voice message left.     Plan: RN CM will attempt again within 4 business days and send letter.    [name], RN, MSN [facility] Care Management Care Management Coordinator Direct Line [phone] Toll Free: [phone]  Fax: [phone]

## 2018-05-04 NOTE — Progress Notes (Signed)
Bp: 152/94, pulse: 58, resp: 18, spo2: 98, last weight:  Todays: 222 lbs  Debra Saunders states feeling pretty good past few days.  She had a dinner and some salty foods but she states she is feeling ok, she gained a few pounds but is losing them.  No edema in right lower extremities, left leg has edema.  Lungs clear, she states her breathing is better, wears her oxygen at all times.  Appetite been good, she has been watching what she eats last couple of days, watches fluid intake.  Meds verified and meds placed in box.  She denies chest pain, dizziness or headaches.  Will continue to visit weekly to educate on heart failure and med compliance.   [place] [phone]

## 2018-05-04 NOTE — Patient Outreach (Signed)
[place] [facility]) Care Management  05/04/2018  Debra Saunders 04-15-1946 [id number]   EMMI- Heart Failure RED ON EMMI ALERT Day # 12 Date: 05/03/18 Red Alert Reason:  Weight 223 lbs   Incoming call from patient.  She is able to verify HIPAA.  Discussed red alert.  Patient reports that she went to a family gathering and ate some things she should not have.  Discussed low salt diet and heart failure symptoms and when to notify physician.  She verbalized understanding.  Patient states that her weight today is 222 lbs.  She feels like she is back on the right track.  Advised patient that she would continue to receive automated calls and if there was some question a nurse would call.  She verbalized understanding and declines any further needs.     Plan: RN CM will close case.  [name], RN, MSN [facility] Care Management Care Management Coordinator Direct Line [phone] Toll Free: [phone]  Fax: [phone]

## 2018-05-05 ENCOUNTER — Other Ambulatory Visit: Payer: Self-pay | Admitting: Pharmacist

## 2018-05-05 ENCOUNTER — Other Ambulatory Visit: Payer: Self-pay | Admitting: Student

## 2018-05-05 DIAGNOSIS — Q438 Other specified congenital malformations of intestine: Secondary | ICD-10-CM

## 2018-05-05 DIAGNOSIS — R933 Abnormal findings on diagnostic imaging of other parts of digestive tract: Secondary | ICD-10-CM

## 2018-05-05 DIAGNOSIS — D125 Benign neoplasm of sigmoid colon: Secondary | ICD-10-CM

## 2018-05-05 DIAGNOSIS — K635 Polyp of colon: Secondary | ICD-10-CM

## 2018-05-05 NOTE — Patient Outreach (Signed)
[place] [facility]) Care Management [name]  05/05/2018  Debra Saunders 1945-11-21 [id number]  Reason for referral: Medication assistance (Eliquis, [name])  [facility] pharmacy case is being closed due to the following reasons:  Goals have been met. Patient has been approved for medication assistance for [name] (through [employer]) and Eliquis (through [employer]). She has received Eliquis in the mail and Dr. Ellen Henri office has [name] waiting for her to pick up. We reviewed the process for medication assistance for 2020.   Patient has been provided [facility] CM contact information if assistance needed in the future.    Thank you for allowing [facility] pharmacy to be involved in this patient's care.    Ruben Reason, PharmD Clinical Pharmacist, [facility] [phone]

## 2018-05-07 ENCOUNTER — Ambulatory Visit: Payer: Medicare HMO

## 2018-05-10 ENCOUNTER — Other Ambulatory Visit: Payer: Self-pay

## 2018-05-10 NOTE — Patient Outreach (Signed)
[facility]) Care Management  05/10/2018  Debra Saunders [birth year] [id number]   EMMI- Heart Failure RED ON EMMI ALERT Day # 18 Date: 05/09/18  Red Alert Reason:  New/worsening problems? Yes  New/worsening shortness of breath? Yes    Outreach attempt: spoke with patient.  She is able to verify HIPAA.  Addressed red alerts with patient.  She states that the system keeps hearing her wrong and denies any problems or questions.  Patient states that her weight today was 221 lbs.  Discussed signs of heart failure and when to notify physician.  She verbalized understanding.  Patient states that today's 05-10-18 call also recorded her wrong.  Advised patient that she would continue to get calls and if there was any question a nurse would call.  She verbalized understanding.     Plan: RN CM will close case.  [name], RN, MSN [facility] Care Management Care Management Coordinator Direct Line [phone] Toll Free: [phone]  Fax: [phone]

## 2018-05-11 ENCOUNTER — Other Ambulatory Visit: Payer: Self-pay

## 2018-05-11 ENCOUNTER — Ambulatory Visit (INDEPENDENT_AMBULATORY_CARE_PROVIDER_SITE_OTHER): Payer: Medicare HMO | Admitting: Cardiovascular Disease

## 2018-05-11 ENCOUNTER — Encounter: Payer: Self-pay | Admitting: Cardiovascular Disease

## 2018-05-11 VITALS — BP 140/70 | HR 61 | Ht 60.0 in | Wt 222.0 lb

## 2018-05-11 DIAGNOSIS — I4819 Other persistent atrial fibrillation: Secondary | ICD-10-CM | POA: Diagnosis not present

## 2018-05-11 DIAGNOSIS — I272 Pulmonary hypertension, unspecified: Secondary | ICD-10-CM

## 2018-05-11 DIAGNOSIS — I5032 Chronic diastolic (congestive) heart failure: Secondary | ICD-10-CM

## 2018-05-11 DIAGNOSIS — I10 Essential (primary) hypertension: Secondary | ICD-10-CM

## 2018-05-11 MED ORDER — POTASSIUM CHLORIDE CRYS ER 20 MEQ PO TBCR: 20.0000 meq | EXTENDED_RELEASE_TABLET | Freq: Two times a day (BID) | ORAL | 3 refills | Status: DC

## 2018-05-11 NOTE — Progress Notes (Signed)
Cardiology Office Note   Date:  05/11/2018   ID:  Debra Saunders, DOB 02/08/1946, MRN [id number]  PCP:  [name], MD  Cardiologist:   [name], MD   Chief Complaint  Patient presents with  . Other    Patient c/o SOB when walking and dizziness. Meds reviewed verbally with patient.       History of Present Illness: Debra Saunders is a 72 y.o. female who presents for a follow-up visit regarding chronic diastolic heart failure, severe pulmonary hypertension, chronic atypical musculoskeletal chest pain, atrial fibrillation on anticoagulation and previous bradycardia status post dual-chamber pacemaker placement. She has chronic medical conditions that include diabetes mellitus, hypertension and obesity. She also has chronic left leg lymphedema. Previous cardiac catheterization in August of 2013 showed no evidence of obstructive coronary artery disease. She had prior subarachnoid hemorrhage with cardiac pulmonary arrest in July 2014 in the setting of anticoagulation for suspected pulmonary embolism.  She underwent a dual-chamber pacemaker placement by Dr. Caryl Comes in [phone] for symptomatic bradycardia.  She had multiple hospitalizations for heart failure.  She is usually sensitive to aggressive diuresis with renal failure.  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 She started following up with the heart failure clinic recently given her severe pulmonary hypertension.  She is on 4 L of oxygen.  She is doing reasonably well with stable exertional dyspnea.  No chest pain.  Her weight has been stable around 222 pounds.   Past Medical History:  Diagnosis Date  . Arthritis   . Atrial fibrillation, persistent    a. s/p TEE-guided DCCV on 08/20/2017  . Cardiorenal syndrome    a. 09/2017 Creat rose to 2.7 w/ diuresis-->HD x 2.  . Chest pain    a. 01/2012 Cath: nonobs dzs;  c. 04/2014 MV: No ischemia.  . Chronic back pain   .  Chronic combined systolic (congestive) and diastolic (congestive) heart failure ([place])    a. Dx 2005 @ [facility];  b. 02/2014 Echo: Ef 55-60%, no rwma, mild MR, PASP 56mmHg. c. 07/2017: EF 30-35%, diffuse HK, trivial AI, severe MR, moderate TR; d. 09/2017 Echo: EF 40-45%, ant, antsep, ap, basal HK; e. 02/2018 Echo: EF 55-60%, Gr1 DD. Ao sclerosis w/o stenosis. Mild to mod MR. Mildly dil LA. Mildly reduced RV fxn. Mild TR. PASP 35-60mmHg.  [name] Chronic respiratory failure (HCC)    a. on home O2  . COPD (chronic obstructive pulmonary disease) ([place])    a. Home O2 - 3lpm  . Gastritis   . History of intracranial hemorrhage    a. 6/14 described by neurosurgery as "small frontal posttraumatic SAH " - pt was on xarelto @ the time.  . Hyperlipidemia   . Hypertension   . Lipoma of back   . Lymphedema    a. Chronic LLE edema.  [name] NICM (nonischemic cardiomyopathy) ([place])    a. 09/2017 Echo: EF 40-45%; b. 09/2017 Cath: minor, insignificant CAD; c. 02/2018 Echo: EF 55-60%, Gr1 DD.  [name] Obesity   . PAH (pulmonary artery hypertension) ([place])    a. 09/2017 Echo: PASP 59mmHg; b. 09/2017 RHC: RA 23, RV 84/13, PA 84/29, PCWP 20, CO 3.89, CI 1.89. LVEDP 18.  . Sepsis with metabolic encephalopathy ([place])   . Sleep apnea   . Streptococcal bacteremia   . Stroke ([place])   . Symptomatic bradycardia    a. s/p BSX PPM.  . Thyroid nodule   . Type II diabetes mellitus (  [place])   . Urinary incontinence     Past Surgical History:  Procedure Laterality Date  . ABDOMINAL HYSTERECTOMY  1969  . CARDIAC CATHETERIZATION  2013   @ [facility]: No obstructive CAD: Only 20% ostial left circumflex.  [name] CARDIOVERSION N/A 10/12/2017   Procedure: CARDIOVERSION;  Surgeon: Minna Merritts, MD;  Location: [facility] ORS;  Service: Cardiovascular;  Laterality: N/A;  . CHOLECYSTECTOMY    . CYST REMOVAL TRUNK  2002   BACK  . DIALYSIS/PERMA CATHETER INSERTION N/A 10/06/2017   Procedure: DIALYSIS/PERMA CATHETER INSERTION;  Surgeon: Katha Cabal, MD;  Location:  [place] CV LAB;  Service: Cardiovascular;  Laterality: N/A;  . INSERT / REPLACE / REMOVE PACEMAKER    . lipoma removal  2014   back  . PACEMAKER INSERTION  [address] [employer] dual chamber pacemaker implanted by Dr Caryl Comes for symptomatic bradycardia  . PERMANENT PACEMAKER INSERTION N/A 01/09/2014   Procedure: PERMANENT PACEMAKER INSERTION;  Surgeon: Deboraha Sprang, MD;  Location: [facility] CATH LAB;  Service: Cardiovascular;  Laterality: N/A;  . RIGHT HEART CATH N/A 04/19/2018   Procedure: RIGHT HEART CATH;  Surgeon: Larey Dresser, MD;  Location: [place] CV LAB;  Service: Cardiovascular;  Laterality: N/A;  . RIGHT/LEFT HEART CATH AND CORONARY ANGIOGRAPHY N/A 10/19/2017   Procedure: RIGHT/LEFT HEART CATH AND CORONARY ANGIOGRAPHY;  Surgeon: Wellington Hampshire, MD;  Location: [place] CV LAB;  Service: Cardiovascular;  Laterality: N/A;  . TEE WITHOUT CARDIOVERSION N/A 08/20/2017   Procedure: TRANSESOPHAGEAL ECHOCARDIOGRAM (TEE) & Direct current cardioversion;  Surgeon: Minna Merritts, MD;  Location: [facility] ORS;  Service: Cardiovascular;  Laterality: N/A;  . TRIGGER FINGER RELEASE    . VAGINAL HYSTERECTOMY       Current Outpatient Medications  Medication Sig Dispense Refill  . acetaminophen (TYLENOL) 325 MG tablet Take 325-650 mg by mouth every 6 (six) hours as needed for mild pain. Reported on 10/10/2015    . albuterol (PROVENTIL) (2.5 MG/3ML) 0.083% nebulizer solution Take 3 mLs (2.5 mg total) by nebulization every 6 (six) hours. 75 mL 12  . apixaban (ELIQUIS) 5 MG TABS tablet Take 1 tablet (5 mg total) by mouth 2 (two) times daily. 60 tablet 0  . cyanocobalamin 1000 MCG tablet Take 1 tablet (1,000 mcg total) by mouth daily. 30 tablet 0  . digoxin (LANOXIN) 0.125 MG tablet Take 1 tablet (0.125 mg total) by mouth daily. 30 tablet 3  . gabapentin (NEURONTIN) 100 MG capsule TAKE 2 CAPSULES BY MOUTH THREE TIMES DAILY 180 capsule 3  . glucose blood (TRUE METRIX BLOOD GLUCOSE TEST) test  strip Check blood glucose twice daily Dx:E11.8, Z79.4 100 each 12  . Menthol, Topical Analgesic, (BIOFREEZE EX) Apply 1 application topically as needed (for pain).     . metFORMIN (GLUCOPHAGE) 500 MG tablet Take 500 mg by mouth 2 (two) times daily with a meal.     . ondansetron (ZOFRAN) 8 MG tablet Take 1 tablet (8 mg total) by mouth every 8 (eight) hours as needed for nausea or vomiting. 30 tablet 2  . OXYGEN Inhale 4 L into the lungs continuous.    . potassium chloride SA (K-DUR,KLOR-CON) 20 MEQ tablet Take 1 tablet (20 mEq total) by mouth 2 (two) times daily. 180 tablet 3  . rosuvastatin (CRESTOR) 10 MG tablet Take 1 tablet (10 mg total) by mouth daily. 90 tablet 3  . sildenafil (REVATIO) 20 MG tablet Take 20 mg by mouth 3 (three) times daily.    [name]  torsemide (DEMADEX) 20 MG tablet Take 3 tablets (60 mg total) by mouth 2 (two) times daily. 967 tablet 0  . [name] 8.93 YB/0.1BP SOPN INJECT 0.75MG  SUBCUTANEOUSLY ONCE A WEEK 4 pen 2  . macitentan (OPSUMIT) 10 MG tablet Take 1 tablet (10 mg total) by mouth daily. (Patient not taking: Reported on 05/11/2018) 30 tablet 3  . spironolactone (ALDACTONE) 25 MG tablet Take 0.5 tablets (12.5 mg total) by mouth daily. 45 tablet 3   No current facility-administered medications for this visit.     Allergies:   Aspirin; Other; and Penicillins    Social History:  The patient  reports that she quit smoking about 27 years ago. Her smoking use included cigarettes. She has a 0.60 pack-year smoking history. She has never used smokeless tobacco. She reports that she does not drink alcohol or use drugs.   Family History:  The patient's family history includes Breast cancer in her maternal aunt; COPD in her mother; Heart disease in her mother; Hypertension in her father, mother, sister, and sister; Thyroid disease in her mother, sister, and sister.    ROS:  Please see the history of present illness.   Otherwise, review of systems are positive for none.   All  other systems are reviewed and negative.    PHYSICAL EXAM: VS:  BP 140/70 (BP Location: Left Arm, Patient Position: Sitting, Cuff Size: Normal)   Pulse 61   Ht 5' (1.524 m)   Wt 222 lb (100.7 kg)   BMI 43.36 kg/m  , BMI Body mass index is 43.36 kg/m. GEN: Well nourished, well developed, in no acute distress  HEENT: normal  Neck: no JVD, carotid bruits, or masses Cardiac: RRR; no  rubs, or gallops, 2/ 6 systolic flow murmur in the aortic area. Chronic left leg edema. Respiratory:  clear to auscultation bilaterally, normal work of breathing GI: soft, nontender, nondistended, + BS MS: no deformity or atrophy  Skin: warm and dry, no rash Neuro:  Strength and sensation are intact Psych: euthymic mood, full affect   EKG:  EKG is ordered today. The ekg ordered today demonstrates AV sequential pacemaker  Recent Labs: 04/17/2018: TSH 0.808 04/19/2018: ALT 13 04/20/2018: Hemoglobin 9.3; Magnesium 2.0; Platelets 185 04/27/2018: B Natriuretic Peptide 89.5; BUN 26; Creatinine, Ser 1.21; Potassium 4.1; Sodium 140    Lipid Panel    Component Value Date/Time   CHOL 102 08/17/2017 0435   CHOL 137 02/03/2013 0441   TRIG 67 08/17/2017 0435   TRIG 197 02/03/2013 0441   HDL 52 08/17/2017 0435   HDL 25 (L) 02/03/2013 0441   CHOLHDL 2.0 08/17/2017 0435   VLDL 13 08/17/2017 0435   VLDL 39 02/03/2013 0441   LDLCALC 37 08/17/2017 0435   LDLCALC 73 02/03/2013 0441      Wt Readings from Last 3 Encounters:  05/11/18 222 lb (100.7 kg)  05/04/18 222 lb (100.7 kg)  04/27/18 225 lb 12.8 oz (102.4 kg)        ASSESSMENT AND PLAN:  1.  Chronic diastolic heart failure: She appears to be euvolemic on current dose of torsemide.   Continue follow-up in the heart failure clinic.  2.  Severe pulmonary hypertension: Currently on sildenafil.  She was started recently on OPSUMIT but this medication has not been approved yet by her insurance.  3. Essential hypertension: Blood pressure is  reasonably controlled on current medications.  4. Status post dual-chamber pacemaker placement: Followed by Dr. Caryl Comes.  5.  Persistent atrial fibrillation: Seems to be  in sinus rhythm now off amiodarone which was discontinued due to suspected lung toxicity.   Disposition:   Continue close follow-up in the heart failure clinic and follow-up with [place] in 6 months.  Signed,  [name], MD  05/11/2018 2:02 PM    [place]

## 2018-05-11 NOTE — Patient Outreach (Signed)
[facility]) Care Management  05/11/2018  EMIYAH SPRAGGINS [birth year] [id number]   EMMI- Heart failure RED ON EMMI ALERT Day # 19 Date: 05/10/18 Red Alert Reason:  New/worsening problems? Yes    Red alert addressed on 05/10/18.  No need for outreach to patient.    Plan: RN CM willcclose case.  [name], RN, MSN [facility] Care Management Care Management Coordinator Direct Line [phone] Toll Free: [phone]  Fax: [phone]

## 2018-05-11 NOTE — Patient Instructions (Signed)
Medication Instructions:  No changes  If you need a refill on your cardiac medications before your next appointment, please call your pharmacy.   Lab work: None ordered  Testing/Procedures: None ordered  Follow-Up: At [facility], you and your health needs are our priority.  As part of our continuing mission to provide you with exceptional heart care, we have created designated Provider Care Teams.  These Care Teams include your primary Cardiologist (physician) and Advanced Practice Providers (APPs -  Physician Assistants and Nurse Practitioners) who all work together to provide you with the care you need, when you need it. You will need a follow up appointment in 6 months.  Please call our office 2 months in advance to schedule this appointment.  You may see Muhammad Arida, MD or one of the following Advanced Practice Providers on your designated Care Team:   Christopher Berge, NP Ryan Dunn, PA-C . Jacquelyn Visser, PA-C   

## 2018-05-11 NOTE — Telephone Encounter (Signed)
Fax from [place] the following   Accu-check aviva plus meter   Accu-check aviva plus test strips   Accu check softclix lancets

## 2018-05-12 ENCOUNTER — Encounter (HOSPITAL_COMMUNITY): Payer: Self-pay

## 2018-05-12 ENCOUNTER — Other Ambulatory Visit (HOSPITAL_COMMUNITY): Payer: Self-pay

## 2018-05-12 NOTE — Telephone Encounter (Signed)
Called and spoke with patient. Pt stated that she is in need for a new meter since her current meter is broke. Sent to PCP for approval.   Send RX to [facility]

## 2018-05-12 NOTE — Progress Notes (Signed)
Bp: 150/82, pulse: 68, resp: 18, spo2: 98, last weight: 222, todays: 222 lbs bgl:113  Today Debra Saunders states she ok, just tired.  She states got up late today, slow start into her day.  I can smell sausage in her home, she states she just ate eggs and sausage for breakfast.  Discussed diet and fluids. Verified meds and she placed meds in med box this week.  Opsumit has not been approved yet, will start then.  She denies chest pain, headaches, shortness of breath at rest or dizziness.  She states she been doing good.  Reminded her of appt in [place] at HF clinic next week, she states she is going.  She states she does not eat much but it is what she does eat.  She gets meals on wheels and they are not low in sodium, she states some of them she does not eat due to the sodium (example: baked spaghetti loaded with cheese).  She has [place] coming out with physical therapy. Will continue to visit and educate on diet and heart failure weekly.   [place] [phone]

## 2018-05-13 MED ORDER — BLOOD GLUCOSE METER KIT: PACK | 0 refills | Status: DC

## 2018-05-13 MED ORDER — GLUCOSE BLOOD VI STRP: ORAL_STRIP | 12 refills | Status: DC

## 2018-05-13 MED ORDER — ACCU-CHEK SOFT TOUCH LANCETS MISC: 12 refills | Status: DC

## 2018-05-14 ENCOUNTER — Telehealth: Payer: Self-pay | Admitting: Family Medicine

## 2018-05-14 ENCOUNTER — Other Ambulatory Visit: Payer: Self-pay

## 2018-05-14 NOTE — Telephone Encounter (Signed)
Copied from [place] [phone]. Topic: Quick Communication - Home Health Verbal Orders >> May 14, 2018  1:35 PM [place] Quan A wrote: Caller/Agency: Wes Kindred at [facility] Number: [place] to leave message Requesting OT/PT/Skilled Nursing/Social Work: PT Frequency: 2 times a week for 1 week, 1 time week for 3 weeks

## 2018-05-14 NOTE — Telephone Encounter (Signed)
Copied from [place] [phone]. Topic: Quick Communication - Home Health Verbal Orders >> May 14, 2018  1:35 PM [place] Quan A wrote: Caller/Agency: Wes Kindred at [facility] Number: [place] to leave message Requesting OT/PT/Skilled Nursing/Social Work: PT Frequency: 2 times a week for 1 week, 1 time week for 3 weeks

## 2018-05-14 NOTE — Patient Outreach (Signed)
[facility]) Care Management  05/14/2018  Debra Saunders 16-Sep-1945 [id number]   EMMI- Heart failure RED ON EMMI ALERT Day # 22 Date:  05/13/18 Red Alert Reason:  Weight 223 lbs  Outreach attempt:spoke with patient.  She reports she is doing ok but still has some tiredness.  She states she was given B-12 to help with her tiredness but she thinks that has not helped much.  She states she sees the physician on Tuesday and will discuss how she feels.  Discussed with patient that heart failure can cause some tiredness as well but encourage her to discuss with the physician next week.  She denies any shortness or breath or increased swelling.  She states her weight today is back down 221 lbs.  Discussed weight red flag from yesterday and when to notify physician.  She verbalized understanding and denies any further needs presently.  Plan: RN CM will close case.  [name], RN, MSN [facility] Care Management Care Management Coordinator Direct Line [phone] Toll Free: [phone]  Fax: [phone]

## 2018-05-14 NOTE — Telephone Encounter (Signed)
LVM giving verbal orders

## 2018-05-16 DIAGNOSIS — I509 Heart failure, unspecified: Secondary | ICD-10-CM | POA: Diagnosis not present

## 2018-05-17 DIAGNOSIS — E1140 Type 2 diabetes mellitus with diabetic neuropathy, unspecified: Secondary | ICD-10-CM | POA: Diagnosis not present

## 2018-05-17 DIAGNOSIS — M5010 Cervical disc disorder with radiculopathy, unspecified cervical region: Secondary | ICD-10-CM | POA: Diagnosis not present

## 2018-05-17 DIAGNOSIS — I4819 Other persistent atrial fibrillation: Secondary | ICD-10-CM | POA: Diagnosis not present

## 2018-05-17 DIAGNOSIS — N183 Chronic kidney disease, stage 3 (moderate): Secondary | ICD-10-CM | POA: Diagnosis not present

## 2018-05-17 DIAGNOSIS — I2721 Secondary pulmonary arterial hypertension: Secondary | ICD-10-CM | POA: Diagnosis not present

## 2018-05-17 DIAGNOSIS — I2510 Atherosclerotic heart disease of native coronary artery without angina pectoris: Secondary | ICD-10-CM | POA: Diagnosis not present

## 2018-05-17 DIAGNOSIS — I130 Hypertensive heart and chronic kidney disease with heart failure and stage 1 through stage 4 chronic kidney disease, or unspecified chronic kidney disease: Secondary | ICD-10-CM | POA: Diagnosis not present

## 2018-05-17 DIAGNOSIS — J449 Chronic obstructive pulmonary disease, unspecified: Secondary | ICD-10-CM | POA: Diagnosis not present

## 2018-05-17 DIAGNOSIS — I5043 Acute on chronic combined systolic (congestive) and diastolic (congestive) heart failure: Secondary | ICD-10-CM | POA: Diagnosis not present

## 2018-05-17 NOTE — Progress Notes (Signed)
Advanced Heart Failure Clinic Note   PCP: [name], MD PCP-Cardiologist: [name], MD  HF: Dr [name]  HPI: Debra Saunders a 72 y.o.femalewith a history of chronic diastolic heart failure, nonobstructive CAD, afib on eliquis, bradycardia s/p PPM([employer]), chronic respiratory failure on home O2, COPD, PAH, DM2, HTN, CKD (required HD in past),DVT s/p IVC filter2004, HL, traumatic subarachnoic hemorrhage while on xarelto2014, CVA2014, OSA,and chronic lymphedema.  Admitted 38/75-64/33/29 with A/C diastolic HF. HF team consulted with concerns for low output. PICC was placed and milrinone started along with IV lasix to facilitate diuresis. RHC done to evaluate PAH (see below). V/Q scan negative. High res CT showed pulmonary fibrosis. Pulmonary consult and recommended stopping amio. She was transitioned to torsemide 60 mg BID. DC weight: 226 lbs.   She returns today for regular follow up. Last visit started on dig and macitentan ordered. She has not yet received macitentan. She is feeling about the same. SOB after walking short distances, which is her baseline. Continues to have frequent pre-syncopal episodes, about 3-4 times/day. No syncope. No orthopnea, PND, or edema. No cough. Denies bleeding on Eliquis. Wearing CPAP qHS. Dr Golden Pop office called her, but she was unable to schedule appointment. Wearing 4L O2 at all times. Weight stable at home. SBP 140-170s at home. Taking all medications. Limits fluid and salt. Followed by [facility] PT and RN. Her insurance provides 6 Lyft rides per year and she only has 2 left after today (one each way). She lives in [place].  Echo 04/16/18: - Left ventricle: The cavity size was normal. Wall thickness was   increased in a pattern of moderate LVH. Systolic function was   normal. The estimated ejection fraction was in the range of 60%   to 65%. Wall motion was normal; there were no regional wall   motion abnormalities.  Left ventricular diastolic function   parameters were normal. - Aortic valve: Sclerosis without stenosis. - Mitral valve: There was mild regurgitation. - Left atrium: The atrium was moderately dilated. - Pulmonary arteries: PA peak pressure: 52 mm Hg (S).  RHC 04/19/18: Hemodynamics (mmHg) RA mean 6 RV 57/11 PA 56/17, mean 33 PCWP mean 12 Oxygen saturations: PA 65% AO 99% Cardiac Output (Fick) 6.26  Cardiac Index (Fick) 3.23 PVR 3.4 WU  FH: mother with CHF  SH: Former smoker 1 pack/week x 2-3 years, quit 28 years ago. No ETOH or drug use. 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 Lives alone. Transportation is an issue.   Review of systems complete and found to be negative unless listed in HPI.   Past Medical History:  Diagnosis Date  . Arthritis   . Atrial fibrillation, persistent    a. s/p TEE-guided DCCV on 08/20/2017  . Cardiorenal syndrome    a. 09/2017 Creat rose to 2.7 w/ diuresis-->HD x 2.  . Chest pain    a. 01/2012 Cath: nonobs dzs;  c. 04/2014 MV: No ischemia.  . Chronic back pain   . Chronic combined systolic (congestive) and diastolic (congestive) heart failure ([place])    a. Dx 2005 @ [facility];  b. 02/2014 Echo: Ef 55-60%, no rwma, mild MR, PASP 68mHg. c. 07/2017: EF 30-35%, diffuse HK, trivial AI, severe MR, moderate TR; d. 09/2017 Echo: EF 40-45%, ant, antsep, ap, basal HK; e. 02/2018 Echo: EF 55-60%, Gr1 DD. Ao sclerosis w/o stenosis. Mild to mod MR. Mildly dil LA. Mildly reduced RV fxn. Mild TR. PASP 35-455mg.  . Marland Kitchenhronic respiratory failure ([place]  a. on home O2  . COPD (chronic obstructive pulmonary disease) ([place])    a. Home O2 - 3lpm  . Gastritis   . History of intracranial hemorrhage    a. 6/14 described by neurosurgery as "small frontal posttraumatic SAH " - pt was on xarelto @ the time.  . Hyperlipidemia   . Hypertension   . Lipoma of back   . Lymphedema    a. Chronic LLE edema.  [name] NICM (nonischemic cardiomyopathy) ([place])    a. 09/2017 Echo: EF  40-45%; b. 09/2017 Cath: minor, insignificant CAD; c. 02/2018 Echo: EF 55-60%, Gr1 DD.  [name] Obesity   . PAH (pulmonary artery hypertension) ([place])    a. 09/2017 Echo: PASP 64mHg; b. 09/2017 RHC: RA 23, RV 84/13, PA 84/29, PCWP 20, CO 3.89, CI 1.89. LVEDP 18.  . Sepsis with metabolic encephalopathy ([place]   . Sleep apnea   . Streptococcal bacteremia   . Stroke ([place]   . Symptomatic bradycardia    a. s/p BSX PPM.  . Thyroid nodule   . Type II diabetes mellitus ([place]   . Urinary incontinence     Current Outpatient Medications  Medication Sig Dispense Refill  . acetaminophen (TYLENOL) 325 MG tablet Take 325-650 mg by mouth every 6 (six) hours as needed for mild pain. Reported on 10/10/2015    . albuterol (PROVENTIL) (2.5 MG/3ML) 0.083% nebulizer solution Take 3 mLs (2.5 mg total) by nebulization every 6 (six) hours. 75 mL 12  . apixaban (ELIQUIS) 5 MG TABS tablet Take 1 tablet (5 mg total) by mouth 2 (two) times daily. 60 tablet 0  . blood glucose meter kit and supplies Dispense based on patient and insurance preference. Use up to four times daily as directed. (FOR ICD-10 E10.9, E11.9). 1 each 0  . cyanocobalamin 1000 MCG tablet Take 1 tablet (1,000 mcg total) by mouth daily. 30 tablet 0  . digoxin (LANOXIN) 0.125 MG tablet Take 1 tablet (0.125 mg total) by mouth daily. 30 tablet 3  . gabapentin (NEURONTIN) 100 MG capsule TAKE 2 CAPSULES BY MOUTH THREE TIMES DAILY 180 capsule 3  . glucose blood (TRUE METRIX BLOOD GLUCOSE TEST) test strip Check blood glucose twice daily Dx:E11.8, Z79.4 100 each 12  . glucose blood test strip Use as instructed 100 each 12  . Lancets (ACCU-CHEK SOFT TOUCH) lancets Use as instructed 100 each 12  . Menthol, Topical Analgesic, (BIOFREEZE EX) Apply 1 application topically as needed (for pain).     . metFORMIN (GLUCOPHAGE) 500 MG tablet Take 500 mg by mouth 2 (two) times daily with a meal.     . ondansetron (ZOFRAN) 8 MG tablet Take 1 tablet (8 mg total) by mouth every 8  (eight) hours as needed for nausea or vomiting. 30 tablet 2  . OXYGEN Inhale 4 L into the lungs continuous.    . potassium chloride SA (K-DUR,KLOR-CON) 20 MEQ tablet Take 1 tablet (20 mEq total) by mouth 2 (two) times daily. 180 tablet 3  . rosuvastatin (CRESTOR) 10 MG tablet Take 1 tablet (10 mg total) by mouth daily. 90 tablet 3  . sildenafil (REVATIO) 20 MG tablet Take 20 mg by mouth 3 (three) times daily.    .Marland Kitchentorsemide (DEMADEX) 20 MG tablet Take 3 tablets (60 mg total) by mouth 2 (two) times daily. 1097tablet 0  . [name] 03.53MGD/9.2EQSOPN INJECT 0.75MG SUBCUTANEOUSLY ONCE A WEEK 4 pen 2  . macitentan (OPSUMIT) 10 MG tablet Take 1 tablet (10 mg total) by  mouth daily. (Patient not taking: Reported on 05/18/2018) 30 tablet 3  . spironolactone (ALDACTONE) 25 MG tablet Take 1 tablet (25 mg total) by mouth at bedtime. 30 tablet 3   No current facility-administered medications for this encounter.     Allergies  Allergen Reactions  . Aspirin Hives and Other (See Comments)    Pt states that she is unable to take because she had bleeding in her brain  . Other Other (See Comments)    Pt states that she is unable to take blood thinners because she had bleeding in her brain; Blood Thinners-per doctor at [facility]  . Penicillins Hives, Shortness Of Breath, Swelling and Other (See Comments)    Has patient had a PCN reaction causing immediate rash, facial/tongue/throat swelling, SOB or lightheadedness with hypotension: Yes Has patient had a PCN reaction causing severe rash involving mucus membranes or skin necrosis: No Has patient had a PCN reaction that required hospitalization No Has patient had a PCN reaction occurring within the last 10 years: No If all of the above answers are "NO", then may proceed with Cephalosporin use.      Social History   Socioeconomic History  . Marital status: Divorced    Spouse name: Not on file  . Number of children: 4  . Years of education: 39  . Highest  education level: High school graduate  Occupational History  . Occupation: Retired- factory work    Comment: exposed to [place]  . Financial resource strain: Not hard at all  . Food insecurity:    Worry: Never true    Inability: Never true  . Transportation needs:    Medical: Yes    Non-medical: Yes  Tobacco Use  . Smoking status: Former Smoker    Packs/day: 0.30    Years: 2.00    Pack years: 0.60    Types: Cigarettes    Last attempt to quit: 06/23/1990    Years since quitting: 27.9  . Smokeless tobacco: Never Used  Substance and Sexual Activity  . Alcohol use: No  . Drug use: No  . Sexual activity: Not Currently  Lifestyle  . Physical activity:    Days per week: 0 days    Minutes per session: 0 min  . Stress: To some extent  Relationships  . Social connections:    Talks on phone: More than three times a week    Gets together: Once a week    Attends religious service: Never    Active member of club or organization: No    Attends meetings of clubs or organizations: Never    Relationship status: Divorced  . Intimate partner violence:    Fear of current or ex partner: Patient refused    Emotionally abused: Patient refused    Physically abused: Patient refused    Forced sexual activity: Patient refused  Other Topics Concern  . Not on file  Social History Narrative   Lives in [place] with daughter. Has 4 children. No pets.      Work - Restaurant manager, fast food, retired      Diet - regular      Family History  Problem Relation Age of Onset  . Heart disease Mother   . Hypertension Mother   . COPD Mother        was a smoker  . Thyroid disease Mother   . Hypertension Father   . Hypertension Sister   . Thyroid disease Sister   . Hypertension Sister   .  Thyroid disease Sister   . Breast cancer Maternal Aunt   . Kidney disease Neg Hx   . Bladder Cancer Neg Hx     Vitals:   05/18/18 1409  BP: (!) 148/88  Pulse: 66  SpO2: 92%  Weight: 101 kg (222 lb  9.6 oz)   Wt Readings from Last 3 Encounters:  05/18/18 101 kg (222 lb 9.6 oz)  05/12/18 100.7 kg (222 lb)  05/11/18 100.7 kg (222 lb)    PHYSICAL EXAM: General: Appears fatigued. HEENT: Normal Neck: Supple. JVP 6-7. Carotids 2+ bilat; no bruits. No thyromegaly or nodule noted. Cor: PMI nondisplaced. RRR, No M/G/R noted Lungs: CTAB, normal effort. On 4 L [place] Abdomen: Soft, non-tender, non-distended, no HSM. No bruits or masses. +BS  Extremities: No cyanosis, clubbing, or rash. RLE 1+ edema (chronic lymphedema). LLE no edema.  Neuro: Alert & orientedx3, cranial nerves grossly intact. moves all 4 extremities w/o difficulty. Affect pleasant   ASSESSMENT & PLAN:  1. 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 Echo 03/2018 EF 60-65% with moderate LVH, RV was difficult to see. Based on cath from 4/19, severe primarily pulmonary artery HTN with PVR 7.2 WU. Filling pressures optimized on RHC, cardiac output preserved.  Moderate pulmonary arterial hypertension.  Required milrinone for diuresis while inpatient.  - NYHA IIIb - Volume stable on exam. Weight down 3 lbs.  - Continue torsemide 60 mg bid.  - Increase spiro to 25 mg daily. BMET 7 days through [facility].  - Continue digoxin 0.125 mg daily. Check dig level today.  - Followed by HF paramedicine in [place].  2. Pulmonary hypertension: She was followed in the past at [facility].  Based on 4/19 cath, she has severe PAH with PVR 7.2 WU. She has been on sildenafil 20 mg tid x 4 years. At one point, she was on ERA, thought this helped. Chronic hypoxemic respiratory failure, on home oxygen. Prior CTA chest from 2/19 showed no PE but there was evidence for interstitial fibrosis with mild bronchiectasis. She was not a heavy smoker. She also has a history of remote DVT (2004) but V/Q scan negative for evidence of chronic PE. Dr. Chase Caller has seen, concerned for possible burnt-out sarcoidosis as cause of her interstitial fibrosis and hilar  adenopathy.  She has family history of this.  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  ACE not elevated, ESR only 32.  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  ESR not high.  RHC showed moderate pulmonary hypertension, PVR 3.4 WU which is significantly lower than on the 4/19 cath.  - High resolution CT chest shows pulmonary fibrosis, not suggestive of sarcoidosis.  Possible amiodarone lung toxicity, pulmonary has recommended stopping amiodarone. Dr Chase Caller will follow her outpatient.  - Continue sildenafil 20 mg TID - Has not yet started macitentan 10 mg daily, but got a packet in the mail about it. Spoke with PharmD about this. She is going to contact her insurance company today.  - She needs to follow up with Dr Chase Caller.  Referred last visit. She needs to call them back to reschedule. I told her to make this a priority.  - Referred to pulmonary rehab (will have to wait to go until [facility] is done) 3. CKD stage 3 with recent AKI:  - BMET today.  4. Atrial fibrillation:  - Continue apixiban. Denies bleeding. Off amio per pulmonary - Regular on exam. 5. OSA:  Continue CPAP.Remains compliant. No change. 6. HTN: elevated today. SBP 140s at home. Increase spiro as above.   Start opsumit ASAP - pharmD working on approval.  Dig level, BMET, BNP Increase spiro to 25 mg daily BMET 1 week through [facility] Schedule follow up with [facility] ASAP Follow up with Dr [name] in January (only able to get one more ride back and forth from [facility] through insurance)  Georgiana Shore, NP 05/18/18  Greater than 50% of the 25 minute visit was spent in counseling/coordination of care regarding disease state education, salt/fluid restriction, sliding scale diuretics, and medication compliance.

## 2018-05-18 ENCOUNTER — Ambulatory Visit (HOSPITAL_COMMUNITY)
Admission: RE | Admit: 2018-05-18 | Discharge: 2018-05-18 | Disposition: A | Discharge status: Home / Self Care | Payer: Medicare HMO | Source: Ambulatory Visit | Attending: Cardiology | Admitting: Cardiology

## 2018-05-18 VITALS — BP 148/88 | HR 66 | Wt 222.6 lb

## 2018-05-18 DIAGNOSIS — G8929 Other chronic pain: Secondary | ICD-10-CM | POA: Insufficient documentation

## 2018-05-18 DIAGNOSIS — Z86718 Personal history of other venous thrombosis and embolism: Secondary | ICD-10-CM | POA: Insufficient documentation

## 2018-05-18 DIAGNOSIS — Z7901 Long term (current) use of anticoagulants: Secondary | ICD-10-CM | POA: Insufficient documentation

## 2018-05-18 DIAGNOSIS — Z9981 Dependence on supplemental oxygen: Secondary | ICD-10-CM | POA: Insufficient documentation

## 2018-05-18 DIAGNOSIS — N183 Chronic kidney disease, stage 3 unspecified: Secondary | ICD-10-CM

## 2018-05-18 DIAGNOSIS — Z8249 Family history of ischemic heart disease and other diseases of the circulatory system: Secondary | ICD-10-CM | POA: Insufficient documentation

## 2018-05-18 DIAGNOSIS — I130 Hypertensive heart and chronic kidney disease with heart failure and stage 1 through stage 4 chronic kidney disease, or unspecified chronic kidney disease: Secondary | ICD-10-CM | POA: Diagnosis not present

## 2018-05-18 DIAGNOSIS — I5022 Chronic systolic (congestive) heart failure: Secondary | ICD-10-CM

## 2018-05-18 DIAGNOSIS — I272 Pulmonary hypertension, unspecified: Secondary | ICD-10-CM

## 2018-05-18 DIAGNOSIS — E118 Type 2 diabetes mellitus with unspecified complications: Secondary | ICD-10-CM

## 2018-05-18 DIAGNOSIS — Z8673 Personal history of transient ischemic attack (TIA), and cerebral infarction without residual deficits: Secondary | ICD-10-CM | POA: Insufficient documentation

## 2018-05-18 DIAGNOSIS — E785 Hyperlipidemia, unspecified: Secondary | ICD-10-CM | POA: Diagnosis not present

## 2018-05-18 DIAGNOSIS — Z794 Long term (current) use of insulin: Secondary | ICD-10-CM

## 2018-05-18 DIAGNOSIS — I4819 Other persistent atrial fibrillation: Secondary | ICD-10-CM

## 2018-05-18 DIAGNOSIS — J449 Chronic obstructive pulmonary disease, unspecified: Secondary | ICD-10-CM | POA: Insufficient documentation

## 2018-05-18 DIAGNOSIS — G4733 Obstructive sleep apnea (adult) (pediatric): Secondary | ICD-10-CM

## 2018-05-18 DIAGNOSIS — Z79899 Other long term (current) drug therapy: Secondary | ICD-10-CM | POA: Insufficient documentation

## 2018-05-18 DIAGNOSIS — R6889 Other general symptoms and signs: Secondary | ICD-10-CM | POA: Diagnosis not present

## 2018-05-18 DIAGNOSIS — I2721 Secondary pulmonary arterial hypertension: Secondary | ICD-10-CM | POA: Diagnosis not present

## 2018-05-18 DIAGNOSIS — Z87891 Personal history of nicotine dependence: Secondary | ICD-10-CM | POA: Diagnosis not present

## 2018-05-18 DIAGNOSIS — Z950 Presence of cardiac pacemaker: Secondary | ICD-10-CM | POA: Insufficient documentation

## 2018-05-18 DIAGNOSIS — E1122 Type 2 diabetes mellitus with diabetic chronic kidney disease: Secondary | ICD-10-CM | POA: Diagnosis not present

## 2018-05-18 DIAGNOSIS — I2510 Atherosclerotic heart disease of native coronary artery without angina pectoris: Secondary | ICD-10-CM | POA: Insufficient documentation

## 2018-05-18 DIAGNOSIS — E669 Obesity, unspecified: Secondary | ICD-10-CM | POA: Insufficient documentation

## 2018-05-18 DIAGNOSIS — I10 Essential (primary) hypertension: Secondary | ICD-10-CM | POA: Diagnosis not present

## 2018-05-18 DIAGNOSIS — M1990 Unspecified osteoarthritis, unspecified site: Secondary | ICD-10-CM | POA: Diagnosis not present

## 2018-05-18 DIAGNOSIS — I890 Lymphedema, not elsewhere classified: Secondary | ICD-10-CM | POA: Insufficient documentation

## 2018-05-18 DIAGNOSIS — Z7984 Long term (current) use of oral hypoglycemic drugs: Secondary | ICD-10-CM | POA: Insufficient documentation

## 2018-05-18 DIAGNOSIS — I5042 Chronic combined systolic (congestive) and diastolic (congestive) heart failure: Secondary | ICD-10-CM | POA: Insufficient documentation

## 2018-05-18 DIAGNOSIS — J8410 Pulmonary fibrosis, unspecified: Secondary | ICD-10-CM | POA: Insufficient documentation

## 2018-05-18 DIAGNOSIS — M549 Dorsalgia, unspecified: Secondary | ICD-10-CM | POA: Diagnosis not present

## 2018-05-18 DIAGNOSIS — Z9989 Dependence on other enabling machines and devices: Secondary | ICD-10-CM | POA: Diagnosis not present

## 2018-05-18 LAB — BASIC METABOLIC PANEL
Anion gap: 10 (ref 5–15)
BUN: 20 mg/dL (ref 8–23)
CO2: 28 mmol/L (ref 22–32)
Calcium: 9.6 mg/dL (ref 8.9–10.3)
Chloride: 103 mmol/L (ref 98–111)
Creatinine, Ser: 1.29 mg/dL — ABNORMAL HIGH (ref 0.44–1.00)
GFR calc Af Amer: 47 mL/min — ABNORMAL LOW (ref >60)
GFR calc non Af Amer: 41 mL/min — ABNORMAL LOW (ref >60)
Glucose, Bld: 107 mg/dL — ABNORMAL HIGH (ref 70–99)
Potassium: 3.8 mmol/L (ref 3.5–5.1)
Sodium: 141 mmol/L (ref 135–145)

## 2018-05-18 LAB — DIGOXIN LEVEL: Digoxin Level: 0.8 ng/mL (ref 0.8–2.0)

## 2018-05-18 LAB — BRAIN NATRIURETIC PEPTIDE: B Natriuretic Peptide: 65 pg/mL (ref 0.0–100.0)

## 2018-05-18 MED ORDER — SPIRONOLACTONE 25 MG PO TABS: 25.0000 mg | ORAL_TABLET | Freq: Every day | ORAL | 3 refills | Status: DC

## 2018-05-18 NOTE — Patient Instructions (Addendum)
INCREASE Spironolactone to 25mg  (1 tab) every night.  Please make your appointment with Dr. Chase Caller as soon as possible.  Address: [address], [place], [place] [address] Phone: [phone]  Labs today We will only contact you if something comes back abnormal or we need to make some changes. Otherwise no news is good news!  Your physician request that you have labs drawn in 1 week. Your home health will draw your labs.  Your physician recommends that you schedule a follow-up appointment in: 4-6 weeks with Dr. [name]

## 2018-05-19 DIAGNOSIS — I2510 Atherosclerotic heart disease of native coronary artery without angina pectoris: Secondary | ICD-10-CM | POA: Diagnosis not present

## 2018-05-19 DIAGNOSIS — N183 Chronic kidney disease, stage 3 (moderate): Secondary | ICD-10-CM | POA: Diagnosis not present

## 2018-05-19 DIAGNOSIS — M5010 Cervical disc disorder with radiculopathy, unspecified cervical region: Secondary | ICD-10-CM | POA: Diagnosis not present

## 2018-05-19 DIAGNOSIS — I130 Hypertensive heart and chronic kidney disease with heart failure and stage 1 through stage 4 chronic kidney disease, or unspecified chronic kidney disease: Secondary | ICD-10-CM | POA: Diagnosis not present

## 2018-05-19 DIAGNOSIS — I5043 Acute on chronic combined systolic (congestive) and diastolic (congestive) heart failure: Secondary | ICD-10-CM | POA: Diagnosis not present

## 2018-05-19 DIAGNOSIS — I4819 Other persistent atrial fibrillation: Secondary | ICD-10-CM | POA: Diagnosis not present

## 2018-05-19 DIAGNOSIS — J449 Chronic obstructive pulmonary disease, unspecified: Secondary | ICD-10-CM | POA: Diagnosis not present

## 2018-05-19 DIAGNOSIS — E1140 Type 2 diabetes mellitus with diabetic neuropathy, unspecified: Secondary | ICD-10-CM | POA: Diagnosis not present

## 2018-05-19 DIAGNOSIS — I2721 Secondary pulmonary arterial hypertension: Secondary | ICD-10-CM | POA: Diagnosis not present

## 2018-05-21 DIAGNOSIS — J449 Chronic obstructive pulmonary disease, unspecified: Secondary | ICD-10-CM | POA: Diagnosis not present

## 2018-05-21 DIAGNOSIS — I130 Hypertensive heart and chronic kidney disease with heart failure and stage 1 through stage 4 chronic kidney disease, or unspecified chronic kidney disease: Secondary | ICD-10-CM | POA: Diagnosis not present

## 2018-05-21 DIAGNOSIS — N183 Chronic kidney disease, stage 3 (moderate): Secondary | ICD-10-CM | POA: Diagnosis not present

## 2018-05-21 DIAGNOSIS — I2721 Secondary pulmonary arterial hypertension: Secondary | ICD-10-CM | POA: Diagnosis not present

## 2018-05-21 DIAGNOSIS — I2510 Atherosclerotic heart disease of native coronary artery without angina pectoris: Secondary | ICD-10-CM | POA: Diagnosis not present

## 2018-05-21 DIAGNOSIS — I5043 Acute on chronic combined systolic (congestive) and diastolic (congestive) heart failure: Secondary | ICD-10-CM | POA: Diagnosis not present

## 2018-05-21 DIAGNOSIS — M5010 Cervical disc disorder with radiculopathy, unspecified cervical region: Secondary | ICD-10-CM | POA: Diagnosis not present

## 2018-05-21 DIAGNOSIS — E1140 Type 2 diabetes mellitus with diabetic neuropathy, unspecified: Secondary | ICD-10-CM | POA: Diagnosis not present

## 2018-05-21 DIAGNOSIS — I4819 Other persistent atrial fibrillation: Secondary | ICD-10-CM | POA: Diagnosis not present

## 2018-05-24 ENCOUNTER — Other Ambulatory Visit: Payer: Self-pay

## 2018-05-24 DIAGNOSIS — I2721 Secondary pulmonary arterial hypertension: Secondary | ICD-10-CM | POA: Diagnosis not present

## 2018-05-24 DIAGNOSIS — M5010 Cervical disc disorder with radiculopathy, unspecified cervical region: Secondary | ICD-10-CM | POA: Diagnosis not present

## 2018-05-24 DIAGNOSIS — I2510 Atherosclerotic heart disease of native coronary artery without angina pectoris: Secondary | ICD-10-CM | POA: Diagnosis not present

## 2018-05-24 DIAGNOSIS — I4819 Other persistent atrial fibrillation: Secondary | ICD-10-CM | POA: Diagnosis not present

## 2018-05-24 DIAGNOSIS — J449 Chronic obstructive pulmonary disease, unspecified: Secondary | ICD-10-CM | POA: Diagnosis not present

## 2018-05-24 DIAGNOSIS — N183 Chronic kidney disease, stage 3 (moderate): Secondary | ICD-10-CM | POA: Diagnosis not present

## 2018-05-24 DIAGNOSIS — E1140 Type 2 diabetes mellitus with diabetic neuropathy, unspecified: Secondary | ICD-10-CM | POA: Diagnosis not present

## 2018-05-24 DIAGNOSIS — I130 Hypertensive heart and chronic kidney disease with heart failure and stage 1 through stage 4 chronic kidney disease, or unspecified chronic kidney disease: Secondary | ICD-10-CM | POA: Diagnosis not present

## 2018-05-24 DIAGNOSIS — I5043 Acute on chronic combined systolic (congestive) and diastolic (congestive) heart failure: Secondary | ICD-10-CM | POA: Diagnosis not present

## 2018-05-24 NOTE — Patient Outreach (Addendum)
[facility]) Care Management  05/24/2018  Debra Saunders October 16, 1945 [id number]   EMMI- RED ON EMMI ALERT Day # Date:  Red Alert Reason:   Outreach attempt: spoke with patient. She reports that she is doing ok.  She reports that her weight is back down to 221 lbs.  Discussed with patient weight parameters and when to notify physician.  Patient states she saw the heart doctor last week and states that everything was ok.  Patient also adds she sees PCP this week for follow up as well. Patient declines any present needs.    Plan: RN CM will close case.    [name], RN, MSN [facility] Care Management Care Management Coordinator Direct Line [phone] Toll Free: [phone]  Fax: [phone]

## 2018-05-25 ENCOUNTER — Telehealth (HOSPITAL_COMMUNITY): Payer: Self-pay | Admitting: Pharmacist

## 2018-05-25 NOTE — Telephone Encounter (Signed)
Opsumit PA approved by [facility] Part D through 06/23/19.   Ruta Hinds. Velva Harman, PharmD, BCPS, CPP Clinical Pharmacist Phone: [phone] 05/25/2018 12:13 PM

## 2018-05-26 DIAGNOSIS — I5043 Acute on chronic combined systolic (congestive) and diastolic (congestive) heart failure: Secondary | ICD-10-CM | POA: Diagnosis not present

## 2018-05-26 DIAGNOSIS — I2721 Secondary pulmonary arterial hypertension: Secondary | ICD-10-CM | POA: Diagnosis not present

## 2018-05-26 DIAGNOSIS — M5010 Cervical disc disorder with radiculopathy, unspecified cervical region: Secondary | ICD-10-CM | POA: Diagnosis not present

## 2018-05-26 DIAGNOSIS — E1140 Type 2 diabetes mellitus with diabetic neuropathy, unspecified: Secondary | ICD-10-CM | POA: Diagnosis not present

## 2018-05-26 DIAGNOSIS — I2510 Atherosclerotic heart disease of native coronary artery without angina pectoris: Secondary | ICD-10-CM | POA: Diagnosis not present

## 2018-05-26 DIAGNOSIS — I4819 Other persistent atrial fibrillation: Secondary | ICD-10-CM | POA: Diagnosis not present

## 2018-05-26 DIAGNOSIS — I130 Hypertensive heart and chronic kidney disease with heart failure and stage 1 through stage 4 chronic kidney disease, or unspecified chronic kidney disease: Secondary | ICD-10-CM | POA: Diagnosis not present

## 2018-05-26 DIAGNOSIS — J449 Chronic obstructive pulmonary disease, unspecified: Secondary | ICD-10-CM | POA: Diagnosis not present

## 2018-05-26 DIAGNOSIS — N183 Chronic kidney disease, stage 3 (moderate): Secondary | ICD-10-CM | POA: Diagnosis not present

## 2018-05-27 ENCOUNTER — Other Ambulatory Visit: Payer: Medicare HMO

## 2018-05-27 ENCOUNTER — Other Ambulatory Visit (HOSPITAL_COMMUNITY): Payer: Self-pay

## 2018-05-27 DIAGNOSIS — I5043 Acute on chronic combined systolic (congestive) and diastolic (congestive) heart failure: Secondary | ICD-10-CM | POA: Diagnosis not present

## 2018-05-27 DIAGNOSIS — J9611 Chronic respiratory failure with hypoxia: Secondary | ICD-10-CM | POA: Diagnosis not present

## 2018-05-27 DIAGNOSIS — I130 Hypertensive heart and chronic kidney disease with heart failure and stage 1 through stage 4 chronic kidney disease, or unspecified chronic kidney disease: Secondary | ICD-10-CM | POA: Diagnosis not present

## 2018-05-27 DIAGNOSIS — E1140 Type 2 diabetes mellitus with diabetic neuropathy, unspecified: Secondary | ICD-10-CM | POA: Diagnosis not present

## 2018-05-27 DIAGNOSIS — N183 Chronic kidney disease, stage 3 (moderate): Secondary | ICD-10-CM | POA: Diagnosis not present

## 2018-05-27 DIAGNOSIS — I4819 Other persistent atrial fibrillation: Secondary | ICD-10-CM | POA: Diagnosis not present

## 2018-05-27 DIAGNOSIS — I2721 Secondary pulmonary arterial hypertension: Secondary | ICD-10-CM | POA: Diagnosis not present

## 2018-05-27 DIAGNOSIS — M5010 Cervical disc disorder with radiculopathy, unspecified cervical region: Secondary | ICD-10-CM | POA: Diagnosis not present

## 2018-05-27 DIAGNOSIS — I2510 Atherosclerotic heart disease of native coronary artery without angina pectoris: Secondary | ICD-10-CM | POA: Diagnosis not present

## 2018-05-27 DIAGNOSIS — J449 Chronic obstructive pulmonary disease, unspecified: Secondary | ICD-10-CM | POA: Diagnosis not present

## 2018-05-27 NOTE — Progress Notes (Signed)
Bp: 152/80, pulse: 63, spo2; 98%, resp: 18 last weight: 222, todays: 221  Today Ericah states her knee is hurting, she states been hurting past month and PT has been working with her but no better.  Advised she may want to visit her PCP.  She states other than that she been feeling pretty good.  She states switched diltiazem to bedtime due to causing nausea.  She has been making her appt, she is waiting for her Opsumit that has been approved to start it. Went over appts coming up. Called in refill for her Torsemide.  Meds verified and med box refilled.  Talked about diet and holidays.  Contacted Post office to see if her mail could be placed on her porch and they stated they need a letter from her doctor. Will reach out to them.  She denies headaches, chest pain, been having some dizziness and short of breath with activity, which is normal for her.  Her left leg has more swelling than normal.  Right plus 1 edema.  Lungs clear.  Will continue to visit weekly to educate on heart failure and diet.   [place] [phone]

## 2018-05-28 ENCOUNTER — Inpatient Hospital Stay: Admission: RE | Admit: 2018-05-28 | Payer: Medicare HMO | Source: Ambulatory Visit

## 2018-05-31 ENCOUNTER — Ambulatory Visit (INDEPENDENT_AMBULATORY_CARE_PROVIDER_SITE_OTHER): Payer: Medicare HMO

## 2018-05-31 DIAGNOSIS — E1140 Type 2 diabetes mellitus with diabetic neuropathy, unspecified: Secondary | ICD-10-CM | POA: Diagnosis not present

## 2018-05-31 DIAGNOSIS — I130 Hypertensive heart and chronic kidney disease with heart failure and stage 1 through stage 4 chronic kidney disease, or unspecified chronic kidney disease: Secondary | ICD-10-CM | POA: Diagnosis not present

## 2018-05-31 DIAGNOSIS — I2510 Atherosclerotic heart disease of native coronary artery without angina pectoris: Secondary | ICD-10-CM | POA: Diagnosis not present

## 2018-05-31 DIAGNOSIS — N183 Chronic kidney disease, stage 3 (moderate): Secondary | ICD-10-CM | POA: Diagnosis not present

## 2018-05-31 DIAGNOSIS — I4819 Other persistent atrial fibrillation: Secondary | ICD-10-CM | POA: Diagnosis not present

## 2018-05-31 DIAGNOSIS — I495 Sick sinus syndrome: Secondary | ICD-10-CM

## 2018-05-31 DIAGNOSIS — M5010 Cervical disc disorder with radiculopathy, unspecified cervical region: Secondary | ICD-10-CM | POA: Diagnosis not present

## 2018-05-31 DIAGNOSIS — J449 Chronic obstructive pulmonary disease, unspecified: Secondary | ICD-10-CM | POA: Diagnosis not present

## 2018-05-31 DIAGNOSIS — I2721 Secondary pulmonary arterial hypertension: Secondary | ICD-10-CM | POA: Diagnosis not present

## 2018-05-31 DIAGNOSIS — I5022 Chronic systolic (congestive) heart failure: Secondary | ICD-10-CM | POA: Diagnosis not present

## 2018-05-31 DIAGNOSIS — I5043 Acute on chronic combined systolic (congestive) and diastolic (congestive) heart failure: Secondary | ICD-10-CM | POA: Diagnosis not present

## 2018-06-01 NOTE — Progress Notes (Signed)
Remote pacemaker transmission.   

## 2018-06-02 ENCOUNTER — Other Ambulatory Visit: Payer: Self-pay | Admitting: *Deleted

## 2018-06-02 DIAGNOSIS — I130 Hypertensive heart and chronic kidney disease with heart failure and stage 1 through stage 4 chronic kidney disease, or unspecified chronic kidney disease: Secondary | ICD-10-CM | POA: Diagnosis not present

## 2018-06-02 DIAGNOSIS — I4819 Other persistent atrial fibrillation: Secondary | ICD-10-CM | POA: Diagnosis not present

## 2018-06-02 DIAGNOSIS — I2721 Secondary pulmonary arterial hypertension: Secondary | ICD-10-CM | POA: Diagnosis not present

## 2018-06-02 DIAGNOSIS — N183 Chronic kidney disease, stage 3 (moderate): Secondary | ICD-10-CM | POA: Diagnosis not present

## 2018-06-02 DIAGNOSIS — I2510 Atherosclerotic heart disease of native coronary artery without angina pectoris: Secondary | ICD-10-CM | POA: Diagnosis not present

## 2018-06-02 DIAGNOSIS — E1140 Type 2 diabetes mellitus with diabetic neuropathy, unspecified: Secondary | ICD-10-CM | POA: Diagnosis not present

## 2018-06-02 DIAGNOSIS — I5043 Acute on chronic combined systolic (congestive) and diastolic (congestive) heart failure: Secondary | ICD-10-CM | POA: Diagnosis not present

## 2018-06-02 DIAGNOSIS — M5010 Cervical disc disorder with radiculopathy, unspecified cervical region: Secondary | ICD-10-CM | POA: Diagnosis not present

## 2018-06-02 DIAGNOSIS — J449 Chronic obstructive pulmonary disease, unspecified: Secondary | ICD-10-CM | POA: Diagnosis not present

## 2018-06-02 NOTE — Patient Outreach (Addendum)
[facility]) Care Management  06/02/2018  Debra Saunders 03/31/1946 [id number]   EMMI-heart failure, RED ON EMMI ALERT Day # 40 Date: 05/31/18 1305 Monday  Red Alert Reason: wt 05/31/18 ws 224 was 222 on 05/30/18   Insurance:  [place] admissions  ED visits in the last 6 months    Outreach attempt # [employer] medicare  Patient is able to verify HIPAA [facility] Care Management RN reviewed and addressed red alert with patient She confirms the EMMI answer was correct She reports eating vegetable lo mein that her daughter brought her on Sunday which caused the wt increase to 224 lbs  She reports for Tuesday the weight went down to 222 and today, Wednesday her wt was 223 lbs  She states she has increased her fluids and has not had any salty foods in the last few days   Social: Debra Saunders is living at home with one of her daughters. She is independent/assist in all care. She gets assist transportation and with food from her daughter.   Conditions: HTN , CHF, atrial fibrillation, pulmonary HTN, Chronic DVT, acute renal failure, anemia, HDL    Medications: denies concerns with taking medications as prescribed, affording medications, side effects of medications and questions about medications  Appointments: to see family MD on 06/02/18   Advance Directives:Denies need for assist with advance directives Has a daughter that is POA   Consent: [facility] RN CM reviewed [facility] services with patient. Patient gave verbal consent for services.   Advised patient that there will be further automated EMMI- post discharge calls to assess how the patient is doing following the recent hospitalization Advised the patient that another call may be received from a nurse if any of their responses were abnormal. Patient voiced understanding and was appreciative of f/u call.   Plan: [facility] RN CM will close case at this time as patient has been assessed and needs resolved.  Pt encouraged to  return a call to [facility] RN CM prn   Kimberly L. Lavina Hamman, RN, BSN, [place] Coordinator Office number [phone] Mobile number [phone]  Main [facility] number [phone] Fax number [phone]

## 2018-06-03 ENCOUNTER — Other Ambulatory Visit (HOSPITAL_COMMUNITY): Payer: Self-pay

## 2018-06-03 ENCOUNTER — Ambulatory Visit (INDEPENDENT_AMBULATORY_CARE_PROVIDER_SITE_OTHER): Payer: Medicare HMO | Admitting: Family Medicine

## 2018-06-03 ENCOUNTER — Encounter: Payer: Self-pay | Admitting: Family Medicine

## 2018-06-03 ENCOUNTER — Telehealth (HOSPITAL_COMMUNITY): Payer: Self-pay

## 2018-06-03 ENCOUNTER — Ambulatory Visit: Payer: Self-pay | Admitting: *Deleted

## 2018-06-03 ENCOUNTER — Encounter (HOSPITAL_COMMUNITY): Payer: Self-pay

## 2018-06-03 ENCOUNTER — Telehealth: Payer: Self-pay | Admitting: Cardiovascular Disease

## 2018-06-03 VITALS — BP 142/78 | HR 62 | Temp 98.4°F | Ht 60.0 in | Wt 225.0 lb

## 2018-06-03 DIAGNOSIS — M5010 Cervical disc disorder with radiculopathy, unspecified cervical region: Secondary | ICD-10-CM | POA: Diagnosis not present

## 2018-06-03 DIAGNOSIS — J449 Chronic obstructive pulmonary disease, unspecified: Secondary | ICD-10-CM | POA: Diagnosis not present

## 2018-06-03 DIAGNOSIS — I10 Essential (primary) hypertension: Secondary | ICD-10-CM

## 2018-06-03 DIAGNOSIS — E1140 Type 2 diabetes mellitus with diabetic neuropathy, unspecified: Secondary | ICD-10-CM | POA: Diagnosis not present

## 2018-06-03 DIAGNOSIS — I130 Hypertensive heart and chronic kidney disease with heart failure and stage 1 through stage 4 chronic kidney disease, or unspecified chronic kidney disease: Secondary | ICD-10-CM | POA: Diagnosis not present

## 2018-06-03 DIAGNOSIS — I5022 Chronic systolic (congestive) heart failure: Secondary | ICD-10-CM | POA: Diagnosis not present

## 2018-06-03 DIAGNOSIS — N183 Chronic kidney disease, stage 3 (moderate): Secondary | ICD-10-CM | POA: Diagnosis not present

## 2018-06-03 DIAGNOSIS — I2510 Atherosclerotic heart disease of native coronary artery without angina pectoris: Secondary | ICD-10-CM | POA: Diagnosis not present

## 2018-06-03 DIAGNOSIS — I5043 Acute on chronic combined systolic (congestive) and diastolic (congestive) heart failure: Secondary | ICD-10-CM | POA: Diagnosis not present

## 2018-06-03 DIAGNOSIS — R0789 Other chest pain: Secondary | ICD-10-CM | POA: Diagnosis not present

## 2018-06-03 DIAGNOSIS — I480 Paroxysmal atrial fibrillation: Secondary | ICD-10-CM | POA: Diagnosis not present

## 2018-06-03 DIAGNOSIS — I2721 Secondary pulmonary arterial hypertension: Secondary | ICD-10-CM | POA: Diagnosis not present

## 2018-06-03 DIAGNOSIS — Z9981 Dependence on supplemental oxygen: Secondary | ICD-10-CM

## 2018-06-03 DIAGNOSIS — I4819 Other persistent atrial fibrillation: Secondary | ICD-10-CM | POA: Diagnosis not present

## 2018-06-03 MED ORDER — METHYLPREDNISOLONE 4 MG PO TBPK: ORAL_TABLET | ORAL | 0 refills | Status: DC

## 2018-06-03 NOTE — Progress Notes (Signed)
Subjective:    Patient ID: Debra Saunders, female    DOB: 1946-03-04, 72 y.o.   MRN: [id number]  HPI   Patient presents to clinic complaining of right-sided chest pain for the past 4 days.  Patient states the pain feels similar to when she had costochondritis in the past.  Denies any palpitations.  Denies any faintness or dizziness.  Denies any diaphoresis.  Patient does have multiple cardiac conditions including A. fib, chronic diastolic heart failure, permanent pacemaker, hypertension, pulmonary hypertension.  Patient is on chronic oxygen via nasal cannula.  Patient also reports she has gained 4 pounds in the past week, I was able to look back in epic and did see a note from the cardiology PA advising patient to increase torsemide to 80 mg twice daily due to the weight gain. Patient states her breathing is at her baseline, does not feel like she is struggling to breathe.   Patient Active Problem List   Diagnosis Date Noted  . acute on chronic diastolic CHF (congestive heart failure) ([place]) 04/15/2018  . Acute renal failure ([place])   . Shortness of breath   . Atrial fibrillation ([place]) 09/22/2017  . Acute bilateral low back pain without sciatica 01/28/2017  . Pain in limb 07/22/2016  . Neuropathy 06/09/2016  . Pain in the chest 03/10/2016  . Chronic deep vein thrombosis (DVT) ([facility]) 03/03/2016  . Chronic back pain 10/19/2015  . Goiter 04/25/2015  . Cervical disc disorder with radiculopathy of cervical region 03/30/2015  . Elevated LFTs 06/29/2014  . 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 Abnormality of gait 01/13/2014  . Sinoatrial node dysfunction ([place]) 01/09/2014  . Allergic rhinitis 10/26/2013  . Lymphedema 08/03/2013  . Constipation 04/12/2013  . Pulmonary embolus ([place]) 01/19/2013  . Subarachnoid hemorrhage ([place]) 01/19/2013  . Musculoskeletal chest pain 01/19/2013  . Preop cardiovascular exam 10/19/2012  . Anemia 04/28/2012  . Chronic respiratory failure with hypoxia  ([place]) 04/26/2012  . Diabetes ([place]) 03/30/2012  . Pulmonary hypertension, unspecified ([place]) 03/30/2012  . Hyperlipidemia 03/30/2012  . Screening for breast cancer 03/30/2012  . Hypothyroidism 03/30/2012  . Chronic systolic heart failure ([place]) 01/22/2012  . Hypertension    Social History   Tobacco Use  . Smoking status: Former Smoker    Packs/day: 0.30    Years: 2.00    Pack years: 0.60    Types: Cigarettes    Last attempt to quit: 06/23/1990    Years since quitting: 27.9  . Smokeless tobacco: Never Used  Substance Use Topics  . Alcohol use: No   Past Surgical History:  Procedure Laterality Date  . ABDOMINAL HYSTERECTOMY  1969  . CARDIAC CATHETERIZATION  2013   @ [place]: No obstructive CAD: Only 20% ostial left circumflex.  [name] CARDIOVERSION N/A 10/12/2017   Procedure: CARDIOVERSION;  Surgeon: Minna Merritts, MD;  Location: [facility] ORS;  Service: Cardiovascular;  Laterality: N/A;  . CHOLECYSTECTOMY    . CYST REMOVAL TRUNK  2002   BACK  . DIALYSIS/PERMA CATHETER INSERTION N/A 10/06/2017   Procedure: DIALYSIS/PERMA CATHETER INSERTION;  Surgeon: Katha Cabal, MD;  Location: [facility];  Service: Cardiovascular;  Laterality: N/A;  . INSERT / REPLACE / REMOVE PACEMAKER    . lipoma removal  2014   back  . PACEMAKER INSERTION  [name] [employer] dual chamber pacemaker implanted by Dr Caryl Comes for symptomatic bradycardia  . PERMANENT PACEMAKER INSERTION N/A 01/09/2014   Procedure: PERMANENT PACEMAKER INSERTION;  Surgeon: Deboraha Sprang, MD;  Location: [facility];  Service: Cardiovascular;  Laterality: N/A;  . RIGHT HEART CATH N/A 04/19/2018   Procedure: RIGHT HEART CATH;  Surgeon: Larey Dresser, MD;  Location: [facility] [place];  Service: Cardiovascular;  Laterality: N/A;  . RIGHT/LEFT HEART CATH AND CORONARY ANGIOGRAPHY N/A 10/19/2017   Procedure: RIGHT/LEFT HEART CATH AND CORONARY ANGIOGRAPHY;  Surgeon: Wellington Hampshire, MD;  Location: [facility];   Service: Cardiovascular;  Laterality: N/A;  . TEE WITHOUT CARDIOVERSION N/A 08/20/2017   Procedure: TRANSESOPHAGEAL ECHOCARDIOGRAM (TEE) & Direct current cardioversion;  Surgeon: Minna Merritts, MD;  Location: [facility] ORS;  Service: Cardiovascular;  Laterality: N/A;  . TRIGGER FINGER RELEASE    . VAGINAL HYSTERECTOMY     Review of Systems  Constitutional: Negative for chills, fatigue and fever.  HENT: Negative for congestion, ear pain, sinus pain and sore throat.   Eyes: Negative.   Respiratory: Negative for cough, shortness of breath and wheezing.   Cardiovascular: +right sided chest pain. +LE swelling - left worse than right, chronic issue Gastrointestinal: Negative for abdominal pain, diarrhea, nausea and vomiting.  Genitourinary: Negative for dysuria, frequency and urgency.  Musculoskeletal: Negative for arthralgias and myalgias.  Skin: Negative for color change, pallor and rash.  Neurological: Negative for syncope, light-headedness and headaches.  Psychiatric/Behavioral: The patient is not nervous/anxious.       Objective:   Physical Exam Vitals signs and nursing note reviewed.  Constitutional:      General: She is not in acute distress.    Appearance: She is obese. She is not diaphoretic.  HENT:     Head: Normocephalic and atraumatic.  Eyes:     Extraocular Movements: Extraocular movements intact.     Pupils: Pupils are equal, round, and reactive to light.  Neck:     Musculoskeletal: Neck supple.     Vascular: No JVD.     Trachea: No tracheal deviation.  Cardiovascular:     Rate and Rhythm: Normal rate and regular rhythm.     Heart sounds: Murmur present. Systolic murmur present with a grade of 2/6. No friction rub. No gallop.   Pulmonary:     Effort: Pulmonary effort is normal. No tachypnea or accessory muscle usage.     Breath sounds: Normal breath sounds. No decreased breath sounds, wheezing, rhonchi or rales.  Chest:       Comments: Location of pain represented  by red circle on diagram. Pain is reproducible with palpation.  Musculoskeletal:     Right lower leg: Edema (+2 edema) present.     Left lower leg: Edema (+3 edema) present.  Skin:    General: Skin is warm and dry.     Coloration: Skin is not pale.  Neurological:     Mental Status: She is alert and oriented to person, place, and time.  Psychiatric:        Mood and Affect: Mood normal. Mood is not anxious.        Behavior: Behavior normal.       Vitals:   06/03/18 1521  BP: (!) 142/78  Pulse: 62  Temp: 98.4 F (36.9 C)  SpO2: 96%   Assessment & Plan:   Chest wall pain, chronic systolic heart failure, essential hypertension, atrial fibrillation, dependence on supplemental oxygen-EKG done in clinic in it with reviewed by me, it appears unremarkable for any new/acute cardiac changes indicating an acute coronary syndrome episode.  Chest wall pain is reproducible with palpation on exam.  Patient has gained 4  pounds in the past week, cardiology note did advise her to increase her torsemide to 80 mg twice daily and patient states she will do this and continue to monitor rate as she always does.  Blood pressure is stable.  Patient will take short steroid course to help improve chest wall pain that I suspect is related to a costochondritis/chronic musculoskeletal pain in chest and also advised to rub Biofreeze on the area.  Advised patient that if chest pain worsens, she continues to gain more weight, shortness of breath worsens, she begins to feel faint/dizzy she is to go to emergency room right away.  She will keep regular scheduled follow-up with PCP and cardiology.  Patient aware she can return to clinic sooner if any issues arise.

## 2018-06-03 NOTE — Progress Notes (Signed)
Bp: 166/80, pulse: 61, resp: 18, spo2: 98% BGL: 142, Todays weight: 225.8 lbs   When arrived at [place] she stated not feeling well today.  She states been having chest pain since yesterday, yesterday she was nauseated and sweatty with it, today it is just pain.  She has weight gain and feels full  In abdominal area, chest and face.  Lungs are clear.  When palpate on chest she has pain, pain is more on right side of chest than left side. She has more swelling in her left leg today.  Chest pain is worst when takes deep breath and moves. She denies more shortness of breath today but appears to be more short of breath to me.  No diet change, only med change is she started Opsumit last week. She has had costochondritis before and she states feels the same.  Contacted Dr Jacklynn Ganong office of weight gain with fullness feeling and of chest pain, they advised was not familiar with Opsumit to contact HF in [place].  Contacted HF in [place] and they advised she should go to ER.  Pt is adamant of not going to ER, she states her PCP is familiar with  Costochondritis and she will go see them.  Contacted them and she has 3:00 appt today at [facility] office, daughter is here and will take her.  She is afraid they will admit her to hospital if she goes to ER.  Meds verified and med box refilled.  Will continue to visit weekly to educate on heart failure, med compliance and diet.   [place] [phone]

## 2018-06-03 NOTE — Telephone Encounter (Signed)
[name] notified and states pt has an appt with PCP today and will wait to see if they send her to the ED. If not, we will increase her torsemide.

## 2018-06-03 NOTE — Telephone Encounter (Signed)
Debra Saunders (paramedicine) called to report that pt has gained 4 lbs in the past week and edema in her left leg, abdomen, and chest area. Pt has been experiencing chest pain for the last 2 days. I advised [name] that pt needs to get checked at ED. Pt is currently taking torsemide 20 mg (Take 3 tablets (60 mg total) by mouth 2 (two) times daily). Please advise.

## 2018-06-03 NOTE — Patient Instructions (Signed)
Rub biofreeze on sore area of chest  Increase torsemide to 80 mg BID, monitor weight  If CP worsens, weight gain worsens, SOB worsens go to ER right away

## 2018-06-03 NOTE — Telephone Encounter (Signed)
Pt had normal coronaries earlier this year so discomfort is likely due to volume overload. It is OK for her to increase torsemide to 80 mg BID x 2 days, and see if this helps. If pain gets any worse or changes in quality, should go to ED.

## 2018-06-03 NOTE — Telephone Encounter (Signed)
Pt c/o swelling: STAT is pt has developed SOB within 24 hours  1) How much weight have you gained and in what time span? 4 pounds in a week  2) If swelling, where is the swelling located? Left leg, abdominal area, face  3) Are you currently taking a fluid pill?  Yes 4) Are you currently SOB? No   5) Do you have a log of your daily weights (if so, list)?   6) Have you gained 3 pounds in a day or 5 pounds in a week?  4 punds in a week  7) Have you traveled recently? no

## 2018-06-03 NOTE — Telephone Encounter (Signed)
We will evaluate her the best we can but I cannot guarantee she will not have to go to ED due to her extensive cardiac history

## 2018-06-03 NOTE — Telephone Encounter (Signed)
Spoke with Debra Saunders after her doctors appt today and advised her per HF clinic in [place] advised for her to take 80 mg torsemide for next 2 days.  She repeated the order back to me and appeared to understand.   [place] [phone]

## 2018-06-03 NOTE — Telephone Encounter (Signed)
Debra Saunders, from the community paramedic program calling to report that the pt has a weight gain and has been experiencing right sided chest pain that goes to the middle of the chest for the past 2 days. Pt has a history of CHF and Pulmonary HTN. [name] also has a note in Epic regarding pt's symptoms. Pt has a history of costochondritis and states that she has the chest pain with movement, taking deep breaths and with palpation. Pt on O2 continuously and denies any increase of SOB. Pt denies and dizziness, nausea or other symptoms at this time. Pt refusing to go to ED and no appt availability with PCP today. Pt scheduled with Lauren,NP today for an appt at 3 pm.  Reason for Disposition . Taking a deep breath makes pain worse  Answer Assessment - Initial Assessment Questions 1. LOCATION: "Where does it hurt?"       Pain on the right side of chest to the middle of chest 2. RADIATION: "Does the pain go anywhere else?" (e.g., into neck, jaw, arms, back)     No 3. ONSET: "When did the chest pain begin?" (Minutes, hours or days)      2 days ago 4. PATTERN "Does the pain come and go, or has it been constant since it started?"  "Does it get worse with exertion?"      Feels chest pain with movement and taking a deep breath, pt also states her chest hurts with palpation 5. DURATION: "How long does it last" (e.g., seconds, minutes, hours)     Comes and goes with movement and taking deep breaths 6. SEVERITY: "How bad is the pain?"  (e.g., Scale 1-10; mild, moderate, or severe)    - MILD (1-3): doesn't interfere with normal activities     - MODERATE (4-7): interferes with normal activities or awakens from sleep    - SEVERE (8-10): excruciating pain, unable to do any normal activities       Pain rating not given 7. CARDIAC RISK FACTORS: "Do you have any history of heart problems or risk factors for heart disease?" (e.g., prior heart attack, angina; high blood pressure, diabetes, being overweight, high cholesterol,  smoking, or strong family history of heart disease)     CHF 8. PULMONARY RISK FACTORS: "Do you have any history of lung disease?"  (e.g., blood clots in lung, asthma, emphysema, birth control pills)    Pulmonary HTN and  pt is on O2 continuously 9. CAUSE: "What do you think is causing the chest pain?"     Hx of costochondritis pt states she feels like it is the same as she had previously 10. OTHER SYMPTOMS: "Do you have any other symptoms?" (e.g., dizziness, nausea, vomiting, sweating, fever, difficulty breathing, cough)       No 11. PREGNANCY: "Is there any chance you are pregnant?" "When was your last menstrual period?"       n/a  Protocols used: CHEST PAIN-A-AH

## 2018-06-03 NOTE — Telephone Encounter (Signed)
FYI

## 2018-06-03 NOTE — Telephone Encounter (Signed)
Late entry: I spoke with [place] with paramedicine. She had called the office when I was involved with 2 nurse visits, and then called back about 30 minutes after the initial call.   She reports that the patient had had a 4 lb weight gain over the last week. She is having som swelling to her left left, belly, chest, and face. There has been no dietary/ fluid indescretion from the patient per her report. Per Alyse Low, she is unsure where the patient's baseline weight is currently. She has been following the patient for about 2 months and her lowest recorded weight is about 218 lbs.  On 12/8 she was 221 lbs. Today she is 225 lbs.  Per Alyse Low, the patient has recently been started on Opsumit 10 mg once daily by the Advanced heart failure clinic.  I advised Alyse Low that I am unfamiliar with this drug and any of it's potential side effects.  Alyse Low also reports that patient is having chest pressure that is worse with movement,  when she presses on her chest, or with deep breathing.  Alyse Low states the patient is refusing the ER and wanting to know if she can get IV diuretics for her increased weight. I advised Alyse Low that she should contact Dr. Claris Gladden office as I am uncertain how aggressive he would want to be with her diuretics.  Per Alyse Low, she will contact the Advanced heart failure clinic.   Alyse Low is aware she can call back if needed.  She voices understanding of the above and is agreeable.

## 2018-06-04 ENCOUNTER — Encounter: Payer: Self-pay | Admitting: Family Medicine

## 2018-06-04 ENCOUNTER — Other Ambulatory Visit: Payer: Self-pay

## 2018-06-04 NOTE — Patient Outreach (Signed)
[facility]) Care Management  06/04/2018  Debra Saunders 1946-03-25 [id number]    EMMI- Heart Failure  RED ON EMMI ALERT Day # 43 Date: 06/04/18 1:01 PM  Red Alert Reason: "Weight 225 lbs, previous Wt on 06/02/18, 223 lbs"  Outreach attempt # 1 successful. Spoke with patient. Reviewed and addressed red alert. Patient states her weight has actually came down 1 lb today, she is at 224 lbs. She states she actually feels better today, reports her chest pain has resolved. Patient followed up with her PCP yesterday due to having increased chest pain and weight gain. She was instructed to use Biofreeze on her chest, start Prednisone and increase her Torsemide. Patient states she picked up her Rx for prednisone from the pharmacy but did not start it due to having decreased symptoms this morning. She is able to verbalize s/s of worsening CHF and knows who to call if symptoms worsen. Patient lives alone but has 2 daughters who live near by and are readily available to assist her if needed. She continues to have [place] including SNV & PT coming in biweekly. Patient is able to do her HEP. She is currently relying on family/friends for transportation and is using the transportation services (6 visits per year) per [facility]. She is working with the [facility] to explore other resources needed including transportation. Patient denies having questions or concerns at this time. She has an emergency alert system and the 24 hr nurse line phone number if needed.   Advised patient she will receive 1 more automated EMMI- HF post discharge call. Advised she will receive a call from a nurse if any of her responses are abnormal. Patient voiced understanding and is appreciative of f/u call.  Plan: RN CM will close the case due to patient has all services needed in place, symptoms have improved and no further CM interventions are needed at this time.  Barb Merino, RN,CCM [facility] Care Management Care Management  Coordinator Direct Phone: [phone] Toll Free: [phone] Fax: [phone]

## 2018-06-07 ENCOUNTER — Other Ambulatory Visit: Payer: Self-pay

## 2018-06-07 ENCOUNTER — Telehealth: Payer: Self-pay

## 2018-06-07 DIAGNOSIS — M5010 Cervical disc disorder with radiculopathy, unspecified cervical region: Secondary | ICD-10-CM | POA: Diagnosis not present

## 2018-06-07 DIAGNOSIS — I4819 Other persistent atrial fibrillation: Secondary | ICD-10-CM | POA: Diagnosis not present

## 2018-06-07 DIAGNOSIS — I2510 Atherosclerotic heart disease of native coronary artery without angina pectoris: Secondary | ICD-10-CM | POA: Diagnosis not present

## 2018-06-07 DIAGNOSIS — I130 Hypertensive heart and chronic kidney disease with heart failure and stage 1 through stage 4 chronic kidney disease, or unspecified chronic kidney disease: Secondary | ICD-10-CM | POA: Diagnosis not present

## 2018-06-07 DIAGNOSIS — I2721 Secondary pulmonary arterial hypertension: Secondary | ICD-10-CM | POA: Diagnosis not present

## 2018-06-07 DIAGNOSIS — N183 Chronic kidney disease, stage 3 (moderate): Secondary | ICD-10-CM | POA: Diagnosis not present

## 2018-06-07 DIAGNOSIS — E1140 Type 2 diabetes mellitus with diabetic neuropathy, unspecified: Secondary | ICD-10-CM | POA: Diagnosis not present

## 2018-06-07 DIAGNOSIS — I5043 Acute on chronic combined systolic (congestive) and diastolic (congestive) heart failure: Secondary | ICD-10-CM | POA: Diagnosis not present

## 2018-06-07 DIAGNOSIS — J449 Chronic obstructive pulmonary disease, unspecified: Secondary | ICD-10-CM | POA: Diagnosis not present

## 2018-06-08 NOTE — Telephone Encounter (Signed)
error 

## 2018-06-09 ENCOUNTER — Encounter: Payer: Self-pay | Admitting: Pulmonary Disease

## 2018-06-09 ENCOUNTER — Ambulatory Visit: Payer: Medicare HMO | Admitting: Pulmonary Disease

## 2018-06-09 VITALS — BP 190/92 | HR 60 | Ht 60.0 in | Wt 223.8 lb

## 2018-06-09 DIAGNOSIS — Z6841 Body Mass Index (BMI) 40.0 and over, adult: Secondary | ICD-10-CM | POA: Diagnosis not present

## 2018-06-09 DIAGNOSIS — G4733 Obstructive sleep apnea (adult) (pediatric): Secondary | ICD-10-CM | POA: Diagnosis not present

## 2018-06-09 DIAGNOSIS — I272 Pulmonary hypertension, unspecified: Secondary | ICD-10-CM

## 2018-06-09 DIAGNOSIS — Z9989 Dependence on other enabling machines and devices: Secondary | ICD-10-CM | POA: Diagnosis not present

## 2018-06-09 NOTE — Patient Instructions (Signed)
1.  We are arranging for some test to be done to include oxygen level at nighttime, breathing tests and blood test.  2.  You may require another evaluation at [facility] pulmonary hypertension clinic as he were previously evaluated there by Dr. Thom Chimes.  3.  We will see him in follow-up in 3 to 4 weeks time.

## 2018-06-10 ENCOUNTER — Other Ambulatory Visit: Payer: Self-pay | Admitting: Family Medicine

## 2018-06-10 ENCOUNTER — Other Ambulatory Visit (HOSPITAL_COMMUNITY): Payer: Self-pay

## 2018-06-10 ENCOUNTER — Encounter (HOSPITAL_COMMUNITY): Payer: Self-pay

## 2018-06-10 NOTE — Progress Notes (Signed)
Bp: 160/86, pulse: 60, resp: 18, spo2: 97 last weight:225 lbs todays: [place] states feels tired all the time.  She states the chest pain went away, she went to her doctors that day and when she got home it was gone and not returned, so she did not take the meds prescribed.  Her weight is down 2 lbs this week.  She has edema in lower legs, plus 1 edema in right leg, plus 2 in left leg.  Her left is always larger. She denies chest pain, shortness of breath at rest, headaches or dizziness today. Lungs clear and abdomen does not feel tight.  Meds verified and med box refilled. Will continue to visit weekly to educate on heart failure, med compliance and diet.   [place] [phone]

## 2018-06-11 ENCOUNTER — Telehealth: Payer: Self-pay

## 2018-06-11 ENCOUNTER — Other Ambulatory Visit (HOSPITAL_COMMUNITY): Payer: Self-pay | Admitting: Cardiology

## 2018-06-11 DIAGNOSIS — I5043 Acute on chronic combined systolic (congestive) and diastolic (congestive) heart failure: Secondary | ICD-10-CM | POA: Diagnosis not present

## 2018-06-11 DIAGNOSIS — I2721 Secondary pulmonary arterial hypertension: Secondary | ICD-10-CM | POA: Diagnosis not present

## 2018-06-11 DIAGNOSIS — I2510 Atherosclerotic heart disease of native coronary artery without angina pectoris: Secondary | ICD-10-CM | POA: Diagnosis not present

## 2018-06-11 DIAGNOSIS — N183 Chronic kidney disease, stage 3 (moderate): Secondary | ICD-10-CM | POA: Diagnosis not present

## 2018-06-11 DIAGNOSIS — I130 Hypertensive heart and chronic kidney disease with heart failure and stage 1 through stage 4 chronic kidney disease, or unspecified chronic kidney disease: Secondary | ICD-10-CM | POA: Diagnosis not present

## 2018-06-11 DIAGNOSIS — I4819 Other persistent atrial fibrillation: Secondary | ICD-10-CM | POA: Diagnosis not present

## 2018-06-11 DIAGNOSIS — M5010 Cervical disc disorder with radiculopathy, unspecified cervical region: Secondary | ICD-10-CM | POA: Diagnosis not present

## 2018-06-11 DIAGNOSIS — J449 Chronic obstructive pulmonary disease, unspecified: Secondary | ICD-10-CM | POA: Diagnosis not present

## 2018-06-11 DIAGNOSIS — E1140 Type 2 diabetes mellitus with diabetic neuropathy, unspecified: Secondary | ICD-10-CM | POA: Diagnosis not present

## 2018-06-11 NOTE — Telephone Encounter (Signed)
Spoke to lab re:06/09/18 labs that were discontinued. They need to be re-entered and redrawn as "received without appropriate specimen." Will notify patient.

## 2018-06-12 ENCOUNTER — Encounter: Payer: Self-pay | Admitting: Pulmonary Disease

## 2018-06-14 ENCOUNTER — Telehealth: Payer: Self-pay

## 2018-06-14 ENCOUNTER — Other Ambulatory Visit: Payer: Self-pay

## 2018-06-14 DIAGNOSIS — I5043 Acute on chronic combined systolic (congestive) and diastolic (congestive) heart failure: Secondary | ICD-10-CM | POA: Diagnosis not present

## 2018-06-14 DIAGNOSIS — I272 Pulmonary hypertension, unspecified: Secondary | ICD-10-CM

## 2018-06-14 DIAGNOSIS — I2721 Secondary pulmonary arterial hypertension: Secondary | ICD-10-CM | POA: Diagnosis not present

## 2018-06-14 DIAGNOSIS — N183 Chronic kidney disease, stage 3 (moderate): Secondary | ICD-10-CM | POA: Diagnosis not present

## 2018-06-14 DIAGNOSIS — M5010 Cervical disc disorder with radiculopathy, unspecified cervical region: Secondary | ICD-10-CM | POA: Diagnosis not present

## 2018-06-14 DIAGNOSIS — E1140 Type 2 diabetes mellitus with diabetic neuropathy, unspecified: Secondary | ICD-10-CM | POA: Diagnosis not present

## 2018-06-14 DIAGNOSIS — I2510 Atherosclerotic heart disease of native coronary artery without angina pectoris: Secondary | ICD-10-CM | POA: Diagnosis not present

## 2018-06-14 DIAGNOSIS — I4819 Other persistent atrial fibrillation: Secondary | ICD-10-CM | POA: Diagnosis not present

## 2018-06-14 DIAGNOSIS — J449 Chronic obstructive pulmonary disease, unspecified: Secondary | ICD-10-CM | POA: Diagnosis not present

## 2018-06-14 DIAGNOSIS — I130 Hypertensive heart and chronic kidney disease with heart failure and stage 1 through stage 4 chronic kidney disease, or unspecified chronic kidney disease: Secondary | ICD-10-CM | POA: Diagnosis not present

## 2018-06-14 NOTE — Progress Notes (Signed)
Subjective:    Patient ID: Debra Saunders, female    DOB: June 10, 1946, 72 y.o.   MRN: [id number]  HPI Patient is a 72 year old former smoker (quit January 1992) who presents for evaluation of dyspnea in the setting of pulmonary hypertension and chronic diastolic dysfunction.  Patient also has been noted to have some fibrotic changes by CT scan of the chest particularly on the upper lobes.  The patient has a very complex medical history and as a matter fact follows regularly with Dr. Wallene Huh (Pulmonology [facility]) she last saw him in June 2019.  Dr. Raul Del has started her on Revatio (sildenafil) and in the past also had her on Letairis, currently she is on Opsumit she started approximately 1-1/2 weeks ago.  She was diagnosed with pulmonary hypertension in 2004 and she was actually evaluated by the most expert physician in this country in this regard, Dr. Thom Chimes, while he was at [facility].  She has been on oxygen since then.  In March or April of this year she required increasing amounts of oxygen she was titrated to 5 L/min with exertion this was done by Dr. Raul Del.  The patient notes severe dyspnea on exertion even with activities of daily living dyspnea is relieved by rest.  She has no response to bronchodilators.  She has been told that she has COPD however prior pulmonary function testing last of which was performed in September 2018 shows mostly restrictive physiology.  With severe diffusion capacity impairment.  Reviewing her most recent CT scan of the chest performed 04/17/2018 Patient is a 72 year old former smoker (quit January 1992) who presents for evaluation of dyspnea in the setting of pulmonary hypertension and chronic diastolic dysfunction.  Patient also has been noted to have some fibrotic changes by CT scan of the chest particularly on the upper lobes.  The patient has a very complex medical history and as a matter fact follows regularly with Dr. Wallene Huh (Pulmonology  [facility]) she last saw him in June 2019.  Dr. Raul Del has started her on Revatio (sildenafil) and in the past also had her on Letairis, currently she is on Opsumit she started approximately 1-1/2 weeks ago.  She was diagnosed with pulmonary hypertension in 2004 and she was actually evaluated by the most expert physician in this country in this regard, Dr. Thom Chimes, while he was at [facility].  She has been on oxygen since then.  In March or April of this year she required increasing amounts of oxygen she was titrated to 5 L/min with exertion this was done by Dr. Raul Del.  The patient notes severe dyspnea on exertion even with activities of daily living dyspnea is relieved by rest.  She has no response to bronchodilators.  She has been told that she has COPD however prior pulmonary function testing last of which was performed in September 2018 shows mostly restrictive physiology.  With severe diffusion capacity impairment.  Reviewing her most recent CT scan of the chest performed 04/17/2018 she has a stable pattern of pulmonary parenchymal fibrosis mostly in the upper lobes (of note the patient carries a diagnosis of sarcoidosis).  Patient was scheduled to see Dr. Chase Caller at [facility] pulmonary in [place] she requested to see a pulmonologist in the [place] area due to issues with transportation.  Then again she actually states that she has been following with Dr. Wallene Huh and has no issues continuing to follow-up with him.  She is seen by Dr. Raul Del every 6 months.  Currently her only complaint is that of significant dyspnea as noted above is only relieved by rest.  This is been ongoing gradually since 2004 and worse over the last 6 months.  She has extensive records that I have attempted to review in the short time allotted for consultation.  As noted she was evaluated by Dr. Thom Chimes in 2004 at [facility].  At that time she was started on oxygen therapy.  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  She was also diagnosed with obstructive sleep apnea and has been on CPAP/BiPAP with oxygen bled in since then.  Had cardiac catheterization on 28 October that is consistent with pulmonary hypertension and apparently showed improvement on her PVR to 3.4 [place] units.  We will have to pare with prior right sided cardiac catheterizations.  Her PA systolic pressure was 53 PA diastolic was 22 PA mean was 34.  The patient is a remote former smoker smoked from 1989-1991, 1/2 pack/week. I do not believe that she has underlying COPD as her tobacco exposure was not prolonged.  All of her pulmonary function testing are more consistent with restriction.  Patient has worked in the [employer] however, is retired from this due to her medical issues.    Review of systems:   A 10 point review of systems was performed and is as noted above otherwise negative.  As noted her most salient complaint is that of dyspnea.  She also has difficulties with right-sided chest pain, palpitations, frequent heartburn and indigestion and joint pain.  Otherwise review is negative. Objective:   Physical Exam Vitals signs and nursing note reviewed.  Constitutional:      General: She is not in acute distress.    Appearance: She is well-developed. She is obese.     Comments: BMI 43  HENT:     Head: Normocephalic and atraumatic.     Right Ear: External ear normal.     Left Ear: External ear normal.     Nose: Nose normal. No congestion.     Mouth/Throat:     Mouth: Mucous membranes are dry.  Eyes:     General: No scleral icterus.    Extraocular Movements: Extraocular movements intact.     Conjunctiva/sclera: Conjunctivae normal.     Pupils: Pupils are equal, round, and reactive to light.  Neck:     Musculoskeletal: Neck supple.      Thyroid: No thyromegaly.     Vascular: JVD present.     Trachea: Trachea and phonation normal. No tracheal deviation.  Cardiovascular:     Rate and Rhythm: Normal rate and regular rhythm.  No extrasystoles are present.    Pulses: Normal pulses.     Comments: Has pacer in place.  Prominent P2 left parasternal. Pulmonary:     Effort: Pulmonary effort is normal. No respiratory distress.     Breath sounds: No wheezing, rhonchi or rales.  Abdominal:     General: Bowel sounds are normal. There is no distension.     Palpations: Abdomen is soft.     Comments: Obese  Musculoskeletal:     Right lower leg: No edema.     Left lower leg: 2+ Edema (Nonpitting) present.  Lymphadenopathy:     Cervical: No cervical adenopathy.  Skin:    General:  Skin is warm and dry.  Neurological:     General: No focal deficit present.     Mental Status: She is alert and oriented to person, place, and time.  Psychiatric:        Mood and Affect: Mood normal.        Behavior: Behavior normal.           Assessment & Plan:   1.  Pulmonary hypertension: I suspect the patient has audible reasons for this particular an overlap of class II and III.  I believe at present that COPD is an issue.  Another possibility is that she may have issues with an overlap of connective tissue disease or sarcoidosis as part of her very complex picture.  Obesity also plays a part and aggravates this issue.  We will repeat pulmonary function testing and evaluate for potential overlap with connective tissue disease by obtaining ANA, rheumatoid factor, SCL 70 and an ACE level.  Continue supplemental oxygen, diuretics and anticoagulants.  2.  Obstructive sleep apnea: Patient is currently on CPAP 13 cmH2O and supplemental oxygen at 4 L bled in.  Will check an overnight oximetry with the CPAP and oxygen in place to evaluate adequacy of her oxygen supplementation.  3.  Severe obesity with BMI of 43: This issue adds complexity to her  management she has been encouraged to engage in weight loss.  4.  Fibrotic changes on CT scan of the chest mostly upper lobes: She carries a diagnosis of sarcoid.  Sarcoidosis has been associated with pulmonary hypertension as well.  She also has had other potential cardiac manifestations with complete heart block requiring pacer.  Patients with sarcoidosis and pulmonary hypertension usually respond better to injectable such as Flolan.  She would require reevaluation at [facility] if this is the case.  5.  Hypertension: Patient is noted to be hypertensive on all of her visits.  This may be adding to diastolic dysfunction which is 1 of her cardiac issues.  This in turn can worsen her issues with pulmonary hypertension.  Recommend tighter control of the blood pressure.   I have discussed with the patient that she needs to decide where she would like her pulmonary care to continue.  Currently she is being seen at 3 facilities for pulmonary follow-up.  She was also advised that we may need further input from [facility].  Thank you very much for allowing me to see this patient.  To participate in the care of this very interesting patient

## 2018-06-14 NOTE — Telephone Encounter (Signed)
Spoke to patient's daughter and let her know about labs needing to be redrawn. States it will probably be Friday before they are able to get it done. She is aware and will call with any further questions.

## 2018-06-15 DIAGNOSIS — I509 Heart failure, unspecified: Secondary | ICD-10-CM | POA: Diagnosis not present

## 2018-06-17 DIAGNOSIS — I5043 Acute on chronic combined systolic (congestive) and diastolic (congestive) heart failure: Secondary | ICD-10-CM | POA: Diagnosis not present

## 2018-06-17 DIAGNOSIS — I2721 Secondary pulmonary arterial hypertension: Secondary | ICD-10-CM | POA: Diagnosis not present

## 2018-06-17 DIAGNOSIS — I4819 Other persistent atrial fibrillation: Secondary | ICD-10-CM | POA: Diagnosis not present

## 2018-06-17 DIAGNOSIS — I2510 Atherosclerotic heart disease of native coronary artery without angina pectoris: Secondary | ICD-10-CM | POA: Diagnosis not present

## 2018-06-17 DIAGNOSIS — E1140 Type 2 diabetes mellitus with diabetic neuropathy, unspecified: Secondary | ICD-10-CM | POA: Diagnosis not present

## 2018-06-17 DIAGNOSIS — J449 Chronic obstructive pulmonary disease, unspecified: Secondary | ICD-10-CM | POA: Diagnosis not present

## 2018-06-17 DIAGNOSIS — N183 Chronic kidney disease, stage 3 (moderate): Secondary | ICD-10-CM | POA: Diagnosis not present

## 2018-06-17 DIAGNOSIS — I130 Hypertensive heart and chronic kidney disease with heart failure and stage 1 through stage 4 chronic kidney disease, or unspecified chronic kidney disease: Secondary | ICD-10-CM | POA: Diagnosis not present

## 2018-06-17 DIAGNOSIS — M5010 Cervical disc disorder with radiculopathy, unspecified cervical region: Secondary | ICD-10-CM | POA: Diagnosis not present

## 2018-06-18 DIAGNOSIS — I272 Pulmonary hypertension, unspecified: Secondary | ICD-10-CM | POA: Diagnosis not present

## 2018-06-22 DIAGNOSIS — M5010 Cervical disc disorder with radiculopathy, unspecified cervical region: Secondary | ICD-10-CM | POA: Diagnosis not present

## 2018-06-22 DIAGNOSIS — I2721 Secondary pulmonary arterial hypertension: Secondary | ICD-10-CM | POA: Diagnosis not present

## 2018-06-22 DIAGNOSIS — E1140 Type 2 diabetes mellitus with diabetic neuropathy, unspecified: Secondary | ICD-10-CM | POA: Diagnosis not present

## 2018-06-22 DIAGNOSIS — I130 Hypertensive heart and chronic kidney disease with heart failure and stage 1 through stage 4 chronic kidney disease, or unspecified chronic kidney disease: Secondary | ICD-10-CM | POA: Diagnosis not present

## 2018-06-22 DIAGNOSIS — N183 Chronic kidney disease, stage 3 (moderate): Secondary | ICD-10-CM | POA: Diagnosis not present

## 2018-06-22 DIAGNOSIS — I4819 Other persistent atrial fibrillation: Secondary | ICD-10-CM | POA: Diagnosis not present

## 2018-06-22 DIAGNOSIS — J449 Chronic obstructive pulmonary disease, unspecified: Secondary | ICD-10-CM | POA: Diagnosis not present

## 2018-06-22 DIAGNOSIS — I2510 Atherosclerotic heart disease of native coronary artery without angina pectoris: Secondary | ICD-10-CM | POA: Diagnosis not present

## 2018-06-22 DIAGNOSIS — I5043 Acute on chronic combined systolic (congestive) and diastolic (congestive) heart failure: Secondary | ICD-10-CM | POA: Diagnosis not present

## 2018-06-23 DIAGNOSIS — I2510 Atherosclerotic heart disease of native coronary artery without angina pectoris: Secondary | ICD-10-CM | POA: Diagnosis not present

## 2018-06-23 DIAGNOSIS — I130 Hypertensive heart and chronic kidney disease with heart failure and stage 1 through stage 4 chronic kidney disease, or unspecified chronic kidney disease: Secondary | ICD-10-CM | POA: Diagnosis not present

## 2018-06-23 DIAGNOSIS — I5043 Acute on chronic combined systolic (congestive) and diastolic (congestive) heart failure: Secondary | ICD-10-CM | POA: Diagnosis not present

## 2018-06-23 DIAGNOSIS — J449 Chronic obstructive pulmonary disease, unspecified: Secondary | ICD-10-CM | POA: Diagnosis not present

## 2018-06-23 DIAGNOSIS — M5010 Cervical disc disorder with radiculopathy, unspecified cervical region: Secondary | ICD-10-CM | POA: Diagnosis not present

## 2018-06-23 DIAGNOSIS — N183 Chronic kidney disease, stage 3 (moderate): Secondary | ICD-10-CM | POA: Diagnosis not present

## 2018-06-23 DIAGNOSIS — I2721 Secondary pulmonary arterial hypertension: Secondary | ICD-10-CM | POA: Diagnosis not present

## 2018-06-23 DIAGNOSIS — I4819 Other persistent atrial fibrillation: Secondary | ICD-10-CM | POA: Diagnosis not present

## 2018-06-23 DIAGNOSIS — E1140 Type 2 diabetes mellitus with diabetic neuropathy, unspecified: Secondary | ICD-10-CM | POA: Diagnosis not present

## 2018-06-24 DIAGNOSIS — M5010 Cervical disc disorder with radiculopathy, unspecified cervical region: Secondary | ICD-10-CM | POA: Diagnosis not present

## 2018-06-24 DIAGNOSIS — E1140 Type 2 diabetes mellitus with diabetic neuropathy, unspecified: Secondary | ICD-10-CM | POA: Diagnosis not present

## 2018-06-24 DIAGNOSIS — N183 Chronic kidney disease, stage 3 (moderate): Secondary | ICD-10-CM | POA: Diagnosis not present

## 2018-06-24 DIAGNOSIS — I2510 Atherosclerotic heart disease of native coronary artery without angina pectoris: Secondary | ICD-10-CM | POA: Diagnosis not present

## 2018-06-24 DIAGNOSIS — I4819 Other persistent atrial fibrillation: Secondary | ICD-10-CM | POA: Diagnosis not present

## 2018-06-24 DIAGNOSIS — J449 Chronic obstructive pulmonary disease, unspecified: Secondary | ICD-10-CM | POA: Diagnosis not present

## 2018-06-24 DIAGNOSIS — I2721 Secondary pulmonary arterial hypertension: Secondary | ICD-10-CM | POA: Diagnosis not present

## 2018-06-24 DIAGNOSIS — I5043 Acute on chronic combined systolic (congestive) and diastolic (congestive) heart failure: Secondary | ICD-10-CM | POA: Diagnosis not present

## 2018-06-24 DIAGNOSIS — I130 Hypertensive heart and chronic kidney disease with heart failure and stage 1 through stage 4 chronic kidney disease, or unspecified chronic kidney disease: Secondary | ICD-10-CM | POA: Diagnosis not present

## 2018-06-25 ENCOUNTER — Emergency Department: Payer: Medicare HMO

## 2018-06-25 ENCOUNTER — Encounter: Payer: Self-pay | Admitting: Family Medicine

## 2018-06-25 ENCOUNTER — Other Ambulatory Visit: Payer: Self-pay

## 2018-06-25 ENCOUNTER — Ambulatory Visit (INDEPENDENT_AMBULATORY_CARE_PROVIDER_SITE_OTHER): Payer: Medicare HMO | Admitting: Family Medicine

## 2018-06-25 ENCOUNTER — Encounter: Payer: Self-pay | Admitting: *Deleted

## 2018-06-25 ENCOUNTER — Emergency Department
Admission: EM | Admit: 2018-06-25 | Discharge: 2018-06-25 | Disposition: A | Discharge status: Home / Self Care | Payer: Medicare HMO | Attending: Emergency Medicine | Admitting: Emergency Medicine

## 2018-06-25 VITALS — BP 130/76 | HR 60 | Temp 97.9°F | Ht 60.0 in | Wt 222.8 lb

## 2018-06-25 DIAGNOSIS — Z7984 Long term (current) use of oral hypoglycemic drugs: Secondary | ICD-10-CM | POA: Diagnosis not present

## 2018-06-25 DIAGNOSIS — Z950 Presence of cardiac pacemaker: Secondary | ICD-10-CM | POA: Diagnosis not present

## 2018-06-25 DIAGNOSIS — Z79899 Other long term (current) drug therapy: Secondary | ICD-10-CM | POA: Insufficient documentation

## 2018-06-25 DIAGNOSIS — Z87891 Personal history of nicotine dependence: Secondary | ICD-10-CM | POA: Diagnosis not present

## 2018-06-25 DIAGNOSIS — I110 Hypertensive heart disease with heart failure: Secondary | ICD-10-CM | POA: Insufficient documentation

## 2018-06-25 DIAGNOSIS — R0602 Shortness of breath: Secondary | ICD-10-CM | POA: Insufficient documentation

## 2018-06-25 DIAGNOSIS — Z794 Long term (current) use of insulin: Secondary | ICD-10-CM | POA: Diagnosis not present

## 2018-06-25 DIAGNOSIS — E039 Hypothyroidism, unspecified: Secondary | ICD-10-CM | POA: Diagnosis not present

## 2018-06-25 DIAGNOSIS — E119 Type 2 diabetes mellitus without complications: Secondary | ICD-10-CM

## 2018-06-25 DIAGNOSIS — E1140 Type 2 diabetes mellitus with diabetic neuropathy, unspecified: Secondary | ICD-10-CM | POA: Diagnosis not present

## 2018-06-25 DIAGNOSIS — E049 Nontoxic goiter, unspecified: Secondary | ICD-10-CM

## 2018-06-25 DIAGNOSIS — R079 Chest pain, unspecified: Secondary | ICD-10-CM | POA: Insufficient documentation

## 2018-06-25 DIAGNOSIS — Z7901 Long term (current) use of anticoagulants: Secondary | ICD-10-CM | POA: Diagnosis not present

## 2018-06-25 DIAGNOSIS — I5042 Chronic combined systolic (congestive) and diastolic (congestive) heart failure: Secondary | ICD-10-CM | POA: Diagnosis not present

## 2018-06-25 DIAGNOSIS — Z8673 Personal history of transient ischemic attack (TIA), and cerebral infarction without residual deficits: Secondary | ICD-10-CM | POA: Insufficient documentation

## 2018-06-25 LAB — CBC
HCT: 34.2 % — ABNORMAL LOW (ref 36.0–46.0)
Hemoglobin: 10.2 g/dL — ABNORMAL LOW (ref 12.0–15.0)
MCH: 26.4 pg (ref 26.0–34.0)
MCHC: 29.8 g/dL — ABNORMAL LOW (ref 30.0–36.0)
MCV: 88.4 fL (ref 80.0–100.0)
Platelets: 220 10*3/uL (ref 150–400)
RBC: 3.87 MIL/uL (ref 3.87–5.11)
RDW: 13.2 % (ref 11.5–15.5)
WBC: 7.9 10*3/uL (ref 4.0–10.5)
nRBC: 0 % (ref 0.0–0.2)

## 2018-06-25 LAB — TROPONIN I
Troponin I: <0.03 ng/mL (ref <0.03)
Troponin I: <0.03 ng/mL (ref <0.03)

## 2018-06-25 LAB — BASIC METABOLIC PANEL
Anion gap: 8 (ref 5–15)
BUN: 31 mg/dL — ABNORMAL HIGH (ref 8–23)
CO2: 26 mmol/L (ref 22–32)
Calcium: 8.9 mg/dL (ref 8.9–10.3)
Chloride: 102 mmol/L (ref 98–111)
Creatinine, Ser: 1.36 mg/dL — ABNORMAL HIGH (ref 0.44–1.00)
GFR calc Af Amer: 44 mL/min — ABNORMAL LOW (ref >60)
GFR calc non Af Amer: 38 mL/min — ABNORMAL LOW (ref >60)
Glucose, Bld: 187 mg/dL — ABNORMAL HIGH (ref 70–99)
Potassium: 4.2 mmol/L (ref 3.5–5.1)
Sodium: 136 mmol/L (ref 135–145)

## 2018-06-25 MED ORDER — LIDOCAINE VISCOUS HCL 2 % MT SOLN
15.0000 mL | Freq: Once | OROMUCOSAL | Status: AC
  Administered 2018-06-25: 15 mL via OROMUCOSAL
  Filled 2018-06-25: qty 15

## 2018-06-25 MED ORDER — NITROGLYCERIN 0.4 MG SL SUBL
0.4000 mg | SUBLINGUAL_TABLET | SUBLINGUAL | Status: DC | PRN
  Administered 2018-06-25: 0.4 mg via SUBLINGUAL
  Filled 2018-06-25: qty 1

## 2018-06-25 NOTE — Progress Notes (Signed)
[name], MD Phone: [phone]  Debra Saunders is a 73 y.o. female who presents today for follow-up.  CC: Chest pain, diabetes, hypertension  Chest pain: Patient notes over the last several days she has been having intermittent chest heaviness that is different than her typical musculoskeletal chest pain.  Notes it worsens with exertion.  Typically occurs in the morning and she notes it feels as though it eases off during the day.  She notes no radiation.  She notes stable dyspnea on exertion.  She does note some orthopnea.  No PND.  She has been lightheaded.  No syncope.  No cough or wheezing.  She has chronic edema that is unchanged.  She is on Eliquis.  Initially she noted no symptoms while in the office and that she did have symptoms earlier this morning though after doing the EKG she developed recurrent symptoms.  Diabetes: Running 139-169 recently.  [place] to 90 over the holidays.  Taking [name] and metformin.  Some polydipsia though no polyuria.  No hypoglycemia.  She does follow with ophthalmology though has not seen them recently.  Goiter: She is due for repeat ultrasound in the near future.  Social History   Tobacco Use  Smoking Status Former Smoker  . Packs/day: 0.30  . Years: 2.00  . Pack years: 0.60  . Types: Cigarettes  . Last attempt to quit: 06/23/1990  . Years since quitting: 28.0  Smokeless Tobacco Never Used     ROS see history of present illness  Objective  Physical Exam Vitals:   06/25/18 1108  BP: 130/76  Pulse: 60  Temp: 97.9 F (36.6 C)  SpO2: 99%    BP Readings from Last 3 Encounters:  06/25/18 126/69  06/25/18 130/76  06/10/18 (!) 160/86   Wt Readings from Last 3 Encounters:  06/25/18 222 lb 12.8 oz (101.1 kg)  06/25/18 222 lb 12.8 oz (101.1 kg)  06/10/18 223 lb (101.2 kg)    Physical Exam Constitutional:      General: She is not in acute distress.    Appearance: She is not diaphoretic.  Cardiovascular:     Rate and  Rhythm: Normal rate and regular rhythm.     Heart sounds: Normal heart sounds.  Pulmonary:     Effort: Pulmonary effort is normal.     Breath sounds: Normal breath sounds.  Skin:    General: Skin is warm and dry.  Neurological:     Mental Status: She is alert.    EKG: Sinus rhythm, rate 61, first-degree AV block, inverted T waves leads I, 2, 3, aVF, V3-V6, these appear to be unchanged from prior  Assessment/Plan: Please see individual problem list.  Diabetes ([place]) She exhibits decent control.  Most recent A1c was adequately controlled.  She will continue her current regimen at this time.  I encouraged her to see ophthalmology.  Pain in the chest Patient reports chest heaviness that is exertional.  This is different than her typical musculoskeletal chest pain.  It would seem unlikely to be related to ischemia given her most recent catheterization though I believe she does warrant evaluation in the emergency department with serial troponins given her persistent chest discomfort.  She will be transported to the emergency department by her granddaughter.  I did discuss having her go by EMS though she deferred this.  Goiter Order placed for thyroid ultrasound.   Orders Placed This Encounter  Procedures  . US THYROID    Standing Status:   Future  Standing Expiration Date:   08/25/2019    Order Specific Question:   Reason for Exam (SYMPTOM  OR DIAGNOSIS REQUIRED)    Answer:   goiter follow up    Order Specific Question:   Preferred imaging location?    Answer:   [facility]  . EKG 12-Lead    No orders of the defined types were placed in this encounter.    [name], MD [place]

## 2018-06-25 NOTE — ED Notes (Signed)
PT given viscous lidocaine at this time.  Will monitor for relief.

## 2018-06-25 NOTE — Discharge Instructions (Addendum)
Please seek medical attention for any high fevers, chest pain, shortness of breath, change in behavior, persistent vomiting, bloody stool or any other new or concerning symptoms.  

## 2018-06-25 NOTE — Patient Instructions (Signed)
Please go to the emergency room. We will contact you regarding repeat imaging of your back.

## 2018-06-25 NOTE — ED Triage Notes (Signed)
Pt to ED reporting chest pressure for the past 3-4 days. Pt went PCP today and was told to come to ED to have heart assessed. PT has a hx of a heart attack in September. Pacemaker in place. Dizziness and lightheadedness intermittently  No cough or congestion, no NV, no increased fatigue. PT wears 4L [place] chronically at home.

## 2018-06-25 NOTE — Progress Notes (Signed)
Called notified ER charge nurse Marya Amsler patient will be coming by car for chest pain not consistent with muscle pain patient has had in the past.

## 2018-06-25 NOTE — ED Provider Notes (Signed)
[facility] Emergency Department Provider Note    ____________________________________________   I have reviewed the triage vital signs and the nursing notes.   HISTORY  Chief Complaint Chest Pain and Shortness of Breath   History limited by: Not Limited   HPI Debra Saunders is a 73 y.o. female who presents to the emergency department today because of concerns for chest pressure.  The patient states it is been present for the past 3 to 4 days.  It is somewhat intermittent.  She has not noticed anything that makes it worse.  She has not noticed anything that makes it better.  She does state that she feels like it changes when she drinks something hot or cold.  She does have a history of chronic shortness of breath.  She is noticed perhaps a mild increase in her shortness of breath when the pain is present.  She denies any sweating episodes or fevers.     Per medical record review patient has a history of atrial fibrillation, COPD  Past Medical History:  Diagnosis Date  . Arthritis   . Atrial fibrillation, persistent    a. s/p TEE-guided DCCV on 08/20/2017  . Cardiorenal syndrome    a. 09/2017 Creat rose to 2.7 w/ diuresis-->HD x 2.  . Chest pain    a. 01/2012 Cath: nonobs dzs;  c. 04/2014 MV: No ischemia.  . Chronic back pain   . Chronic combined systolic (congestive) and diastolic (congestive) heart failure ([place])    a. Dx 2005 @ [facility];  b. 02/2014 Echo: Ef 55-60%, no rwma, mild MR, PASP 20mHg. c. 07/2017: EF 30-35%, diffuse HK, trivial AI, severe MR, moderate TR; d. 09/2017 Echo: EF 40-45%, ant, antsep, ap, basal HK; e. 02/2018 Echo: EF 55-60%, Gr1 DD. Ao sclerosis w/o stenosis. Mild to mod MR. Mildly dil LA. Mildly reduced RV fxn. Mild TR. PASP 35-427mg.  . Marland Kitchenhronic respiratory failure (HCC)    a. on home O2  . COPD (chronic obstructive pulmonary disease) (HCVictoria   a. Home O2 - 3lpm  . Gastritis   . History of intracranial hemorrhage    a. 6/14 described  by neurosurgery as "small frontal posttraumatic SAH " - pt was on xarelto @ the time.  . Hyperlipidemia   . Hypertension   . Lipoma of back   . Lymphedema    a. Chronic LLE edema.  . Marland KitchenICM (nonischemic cardiomyopathy) (HCOak [place]   a. 09/2017 Echo: EF 40-45%; b. 09/2017 Cath: minor, insignificant CAD; c. 02/2018 Echo: EF 55-60%, Gr1 DD.  . Marland Kitchenbesity   . PAH (pulmonary artery hypertension) (HCSalt [place]   a. 09/2017 Echo: PASP 6525m; b. 09/2017 RHC: RA 23, RV 84/13, PA 84/29, PCWP 20, CO 3.89, CI 1.89. LVEDP 18.  . Sepsis with metabolic encephalopathy (HCCLa [place] . Sleep apnea   . Streptococcal bacteremia   . Stroke (HCC[place] . Symptomatic bradycardia    a. s/p BSX PPM.  . Thyroid nodule   . Type II diabetes mellitus (HCCEast [place] . Urinary incontinence     Patient Active Problem List   Diagnosis Date Noted  . acute on chronic diastolic CHF (congestive heart failure) (HCCClermont0/24/2019  . Acute renal failure (HCCO[place] . Shortness of breath   . Atrial fibrillation (HCCTynd[place]4/07/2017  . Acute bilateral low back pain without sciatica 01/28/2017  . Pain in limb 07/22/2016  . Neuropathy 06/09/2016  . Pain in the chest 03/10/2016  . Chronic deep vein  thrombosis (DVT) ([place]) 03/03/2016  . Chronic back pain 10/19/2015  . Goiter 04/25/2015  . Cervical disc disorder with radiculopathy of cervical region 03/30/2015  . Elevated LFTs 06/29/2014  . 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 Abnormality of gait 01/13/2014  . Sinoatrial node dysfunction ([place]) 01/09/2014  . Allergic rhinitis 10/26/2013  . Lymphedema 08/03/2013  . Constipation 04/12/2013  . Pulmonary embolus ([place]) 01/19/2013  . Subarachnoid hemorrhage ([place]) 01/19/2013  . Musculoskeletal chest pain 01/19/2013  . Preop cardiovascular exam 10/19/2012  . Anemia 04/28/2012  . Chronic respiratory failure with hypoxia ([place]) 04/26/2012  . Diabetes ([place]) 03/30/2012  . Pulmonary hypertension, unspecified ([place]) 03/30/2012  . Hyperlipidemia 03/30/2012  .  Screening for breast cancer 03/30/2012  . Hypothyroidism 03/30/2012  . Chronic systolic heart failure ([place]) 01/22/2012  . Hypertension     Past Surgical History:  Procedure Laterality Date  . ABDOMINAL HYSTERECTOMY  1969  . CARDIAC CATHETERIZATION  2013   @ [place]: No obstructive CAD: Only 20% ostial left circumflex.  [name] CARDIOVERSION N/A 10/12/2017   Procedure: CARDIOVERSION;  Surgeon: Minna Merritts, MD;  Location: [facility] ORS;  Service: Cardiovascular;  Laterality: N/A;  . CHOLECYSTECTOMY    . CYST REMOVAL TRUNK  2002   BACK  . DIALYSIS/PERMA CATHETER INSERTION N/A 10/06/2017   Procedure: DIALYSIS/PERMA CATHETER INSERTION;  Surgeon: Katha Cabal, MD;  Location: [place] CV LAB;  Service: Cardiovascular;  Laterality: N/A;  . INSERT / REPLACE / REMOVE PACEMAKER    . lipoma removal  2014   back  . PACEMAKER INSERTION  [address] Scientific dual chamber pacemaker implanted by Dr Caryl Comes for symptomatic bradycardia  . PERMANENT PACEMAKER INSERTION N/A 01/09/2014   Procedure: PERMANENT PACEMAKER INSERTION;  Surgeon: Deboraha Sprang, MD;  Location: [facility] CATH LAB;  Service: Cardiovascular;  Laterality: N/A;  . RIGHT HEART CATH N/A 04/19/2018   Procedure: RIGHT HEART CATH;  Surgeon: Larey Dresser, MD;  Location: [place] CV LAB;  Service: Cardiovascular;  Laterality: N/A;  . RIGHT/LEFT HEART CATH AND CORONARY ANGIOGRAPHY N/A 10/19/2017   Procedure: RIGHT/LEFT HEART CATH AND CORONARY ANGIOGRAPHY;  Surgeon: Wellington Hampshire, MD;  Location: [place] CV LAB;  Service: Cardiovascular;  Laterality: N/A;  . TEE WITHOUT CARDIOVERSION N/A 08/20/2017   Procedure: TRANSESOPHAGEAL ECHOCARDIOGRAM (TEE) & Direct current cardioversion;  Surgeon: Minna Merritts, MD;  Location: [facility] ORS;  Service: Cardiovascular;  Laterality: N/A;  . TRIGGER FINGER RELEASE    . VAGINAL HYSTERECTOMY      Prior to Admission medications   Medication Sig Start Date End Date Taking? Authorizing Provider   acetaminophen (TYLENOL) 325 MG tablet Take 325-650 mg by mouth every 6 (six) hours as needed for mild pain. Reported on 10/10/2015    [provider]  albuterol (PROVENTIL) (2.5 MG/3ML) 0.083% nebulizer solution Take 3 mLs (2.5 mg total) by nebulization every 6 (six) hours. 08/28/17   Loletha Grayer, MD  apixaban (ELIQUIS) 5 MG TABS tablet Take 1 tablet (5 mg total) by mouth 2 (two) times daily. 03/12/18   Loletha Grayer, MD  blood glucose meter kit and supplies Dispense based on patient and insurance preference. Use up to four times daily as directed. (FOR ICD-10 E10.9, E11.9). 05/13/18   [name], MD  digoxin (LANOXIN) 0.125 MG tablet Take 1 tablet (0.125 mg total) by mouth daily. 04/27/18   Georgiana Shore, NP  gabapentin (NEURONTIN) 100 MG capsule TAKE 2 CAPSULES BY MOUTH THREE TIMES DAILY 06/10/18   [name],  [name], MD  glucose blood (TRUE METRIX BLOOD GLUCOSE TEST) test strip Check blood glucose twice daily Dx:E11.8, Z79.4 09/17/17   [name], MD  glucose blood test strip Use as instructed 05/13/18   [name], MD  Lancets (ACCU-CHEK SOFT TOUCH) lancets Use as instructed 05/13/18   [name], MD  macitentan (OPSUMIT) 10 MG tablet Take 1 tablet (10 mg total) by mouth daily. 04/27/18   Georgiana Shore, NP  Menthol, Topical Analgesic, (BIOFREEZE EX) Apply 1 application topically as needed (for pain).     [provider]  metFORMIN (GLUCOPHAGE) 500 MG tablet Take 500 mg by mouth 2 (two) times daily with a meal.     [provider]  ondansetron (ZOFRAN) 8 MG tablet Take 1 tablet (8 mg total) by mouth every 8 (eight) hours as needed for nausea or vomiting. 09/25/14   Jackolyn Confer, MD  OXYGEN Inhale 4 L into the lungs continuous.    [provider]  potassium chloride SA (K-DUR,KLOR-CON) 20 MEQ tablet Take 1 tablet (20 mEq total) by mouth 2 (two) times daily. 05/11/18   Wellington Hampshire, MD  rosuvastatin (CRESTOR) 10 MG  tablet Take 1 tablet (10 mg total) by mouth daily. 11/30/17   [name], MD  sildenafil (REVATIO) 20 MG tablet Take 20 mg by mouth 3 (three) times daily. 05/20/17   [provider]  spironolactone (ALDACTONE) 25 MG tablet Take 1 tablet (25 mg total) by mouth at bedtime. 05/18/18 08/16/18  Georgiana Shore, NP  torsemide (DEMADEX) 20 MG tablet Take 3 tablets (60 mg total) by mouth 2 (two) times daily. 04/27/18 06/10/18  Theora Gianotti, NP  [name] 3.57 SV/7.7LT SOPN INJECT 0.75MG SUBCUTANEOUSLY ONCE A WEEK 12/22/17   [name], MD    Allergies Aspirin; Other; and Penicillins  Family History  Problem Relation Age of Onset  . Heart disease Mother   . Hypertension Mother   . COPD Mother        was a smoker  . Thyroid disease Mother   . Hypertension Father   . Hypertension Sister   . Thyroid disease Sister   . Hypertension Sister   . Thyroid disease Sister   . Breast cancer Maternal Aunt   . Kidney disease Neg Hx   . Bladder Cancer Neg Hx     Social History Social History   Tobacco Use  . Smoking status: Former Smoker    Packs/day: 0.30    Years: 2.00    Pack years: 0.60    Types: Cigarettes    Last attempt to quit: 06/23/1990    Years since quitting: 28.0  . Smokeless tobacco: Never Used  Substance Use Topics  . Alcohol use: No  . Drug use: No    Review of Systems Constitutional: No fever/chills Eyes: No visual changes. ENT: No sore throat. Cardiovascular: Positive for chest pain. Respiratory: Positive for chronic shortness of breath. Gastrointestinal: No abdominal pain.  No nausea, no vomiting.  No diarrhea.   Genitourinary: Negative for dysuria. Musculoskeletal: Negative for back pain. Skin: Negative for rash. Neurological: Negative for headaches, focal weakness or numbness.  ____________________________________________   PHYSICAL EXAM:  VITAL SIGNS: ED Triage Vitals [06/25/18 1309]  Enc Vitals Group     BP (!) 144/64      Pulse Rate 60     Resp 18     Temp 98.4 F (36.9 C)     Temp Source Oral  SpO2 94 %     Weight 222 lb 12.8 oz (101.1 kg)     Height 5' (1.524 m)     Head Circumference      Peak Flow      Pain Score 5   Constitutional: Alert and oriented.  Eyes: Conjunctivae are normal.  ENT      Head: Normocephalic and atraumatic.      Nose: No congestion/rhinnorhea.      Mouth/Throat: Mucous membranes are moist.      Neck: No stridor. Hematological/Lymphatic/Immunilogical: No cervical lymphadenopathy. Cardiovascular: Normal rate, regular rhythm.  No murmurs, rubs, or gallops. Respiratory: Normal respiratory effort without tachypnea nor retractions. Breath sounds are clear and equal bilaterally. No wheezes/rales/rhonchi. Gastrointestinal: Soft and non tender. No rebound. No guarding.  Genitourinary: Deferred Musculoskeletal: Normal range of motion in all extremities.  Neurologic:  Normal speech and language. No gross focal neurologic deficits are appreciated.  Skin:  Skin is warm, dry and intact. No rash noted. Psychiatric: Mood and affect are normal. Speech and behavior are normal. Patient exhibits appropriate insight and judgment.  ____________________________________________    LABS (pertinent positives/negatives)  CBC wbc 7.9, hgb 10.2, plt 220 BMP na 136, k 4.2, glu 187, cr 1.36 Trop <0.03  ____________________________________________   EKG  I, Nance Pear, attending physician, personally viewed and interpreted this EKG  EKG Time: 1254 Rate: 62 Rhythm: av dual paced rhythm Axis: left axis deviation Intervals: qtc 424 QRS: narrow, q waves v1 ST changes: no st elevation Impression: abnormal ekg ____________________________________________    RADIOLOGY  CXR No new focal abnormality  ____________________________________________   PROCEDURES  Procedures  ____________________________________________   INITIAL IMPRESSION / ASSESSMENT AND PLAN / ED  COURSE  Pertinent labs & imaging results that were available during my care of the patient were reviewed by me and considered in my medical decision making (see chart for details).   Patient presented to the emergency department today because of concerns for chest pressure.  Has been going on for the past few days.  Differential would be broad including esophagitis, ACS, pneumonia, pneumothorax, costochondritis amongst other etiologies.  Patient's troponin was negative.  Given the fact that the symptoms have been ongoing for the past 3 or 4 days I would expect an elevation if this represented ACS.  Chest x-ray without any pneumonia or pneumothorax.  Patient was given both lidocaine as well as nitroglycerin without any significant change in the patient's symptoms.  I do wonder if patient suffering from some costochondritis.  Discussed findings with patient.  Did discuss return precautions and importance of follow-up.  ____________________________________________   FINAL CLINICAL IMPRESSION(S) / ED DIAGNOSES  Final diagnoses:  Nonspecific chest pain     Note: This dictation was prepared with Dragon dictation. Any transcriptional errors that result from this process are unintentional     Nance Pear, MD 06/25/18 [phone]

## 2018-06-26 ENCOUNTER — Telehealth: Payer: Self-pay | Admitting: Family Medicine

## 2018-06-26 NOTE — Assessment & Plan Note (Addendum)
She exhibits decent control.  Most recent A1c was adequately controlled.  She will continue her current regimen at this time.  I encouraged her to see ophthalmology.

## 2018-06-26 NOTE — Assessment & Plan Note (Signed)
Order placed for thyroid ultrasound. 

## 2018-06-26 NOTE — Assessment & Plan Note (Signed)
Patient reports chest heaviness that is exertional.  This is different than her typical musculoskeletal chest pain.  It would seem unlikely to be related to ischemia given her most recent catheterization though I believe she does warrant evaluation in the emergency department with serial troponins given her persistent chest discomfort.  She will be transported to the emergency department by her granddaughter.  I did discuss having her go by EMS though she deferred this.

## 2018-06-26 NOTE — Telephone Encounter (Signed)
Please contact the patient.  She was seen on Friday though sent to the emergency room and did not get her flu vaccine prior to leaving.  Please see if she can come in for nurse visit for that to be done.  Please also let her know she is due for a repeat ultrasound of her thyroid.  I have placed an order for this.  Somebody will be contacting her to get this completed.

## 2018-06-27 DIAGNOSIS — J9611 Chronic respiratory failure with hypoxia: Secondary | ICD-10-CM | POA: Diagnosis not present

## 2018-06-28 ENCOUNTER — Other Ambulatory Visit: Payer: Self-pay | Admitting: Internal Medicine

## 2018-06-28 DIAGNOSIS — N183 Chronic kidney disease, stage 3 (moderate): Secondary | ICD-10-CM | POA: Diagnosis not present

## 2018-06-28 DIAGNOSIS — I5043 Acute on chronic combined systolic (congestive) and diastolic (congestive) heart failure: Secondary | ICD-10-CM | POA: Diagnosis not present

## 2018-06-28 DIAGNOSIS — J449 Chronic obstructive pulmonary disease, unspecified: Secondary | ICD-10-CM | POA: Diagnosis not present

## 2018-06-28 DIAGNOSIS — I2721 Secondary pulmonary arterial hypertension: Secondary | ICD-10-CM | POA: Diagnosis not present

## 2018-06-28 DIAGNOSIS — I2510 Atherosclerotic heart disease of native coronary artery without angina pectoris: Secondary | ICD-10-CM | POA: Diagnosis not present

## 2018-06-28 DIAGNOSIS — I4819 Other persistent atrial fibrillation: Secondary | ICD-10-CM | POA: Diagnosis not present

## 2018-06-28 DIAGNOSIS — E1140 Type 2 diabetes mellitus with diabetic neuropathy, unspecified: Secondary | ICD-10-CM | POA: Diagnosis not present

## 2018-06-28 DIAGNOSIS — I130 Hypertensive heart and chronic kidney disease with heart failure and stage 1 through stage 4 chronic kidney disease, or unspecified chronic kidney disease: Secondary | ICD-10-CM | POA: Diagnosis not present

## 2018-06-28 DIAGNOSIS — M5010 Cervical disc disorder with radiculopathy, unspecified cervical region: Secondary | ICD-10-CM | POA: Diagnosis not present

## 2018-06-28 NOTE — Telephone Encounter (Signed)
Called and spoke with patient. Pt advised and voiced understanding. Will get flu shot on Friday with Denisa for her AWV visit.

## 2018-06-29 ENCOUNTER — Other Ambulatory Visit
Admission: RE | Admit: 2018-06-29 | Discharge: 2018-06-29 | Disposition: A | Discharge status: Home / Self Care | Payer: Medicare HMO | Attending: Pulmonary Disease | Admitting: Pulmonary Disease

## 2018-06-29 ENCOUNTER — Ambulatory Visit (HOSPITAL_COMMUNITY): Payer: Medicare HMO

## 2018-06-29 DIAGNOSIS — I272 Pulmonary hypertension, unspecified: Secondary | ICD-10-CM | POA: Diagnosis not present

## 2018-06-30 DIAGNOSIS — J449 Chronic obstructive pulmonary disease, unspecified: Secondary | ICD-10-CM | POA: Diagnosis not present

## 2018-06-30 DIAGNOSIS — I2510 Atherosclerotic heart disease of native coronary artery without angina pectoris: Secondary | ICD-10-CM | POA: Diagnosis not present

## 2018-06-30 DIAGNOSIS — E1140 Type 2 diabetes mellitus with diabetic neuropathy, unspecified: Secondary | ICD-10-CM | POA: Diagnosis not present

## 2018-06-30 DIAGNOSIS — I130 Hypertensive heart and chronic kidney disease with heart failure and stage 1 through stage 4 chronic kidney disease, or unspecified chronic kidney disease: Secondary | ICD-10-CM | POA: Diagnosis not present

## 2018-06-30 DIAGNOSIS — N183 Chronic kidney disease, stage 3 (moderate): Secondary | ICD-10-CM | POA: Diagnosis not present

## 2018-06-30 DIAGNOSIS — I4819 Other persistent atrial fibrillation: Secondary | ICD-10-CM | POA: Diagnosis not present

## 2018-06-30 DIAGNOSIS — I5043 Acute on chronic combined systolic (congestive) and diastolic (congestive) heart failure: Secondary | ICD-10-CM | POA: Diagnosis not present

## 2018-06-30 DIAGNOSIS — M5010 Cervical disc disorder with radiculopathy, unspecified cervical region: Secondary | ICD-10-CM | POA: Diagnosis not present

## 2018-06-30 DIAGNOSIS — I2721 Secondary pulmonary arterial hypertension: Secondary | ICD-10-CM | POA: Diagnosis not present

## 2018-06-30 LAB — ANGIOTENSIN CONVERTING ENZYME: Angiotensin-Converting Enzyme: 17 U/L (ref 14–82)

## 2018-06-30 LAB — ANTI-SCLERODERMA ANTIBODY: Scleroderma (Scl-70) (ENA) Antibody, IgG: <0.2 AI (ref 0.0–0.9)

## 2018-06-30 LAB — ANA W/REFLEX: Anti Nuclear Antibody(ANA): NEGATIVE

## 2018-06-30 LAB — RHEUMATOID FACTOR: Rhuematoid fact SerPl-aCnc: 15.3 IU/mL — ABNORMAL HIGH (ref 0.0–13.9)

## 2018-07-01 ENCOUNTER — Other Ambulatory Visit (HOSPITAL_COMMUNITY): Payer: Self-pay

## 2018-07-01 NOTE — Progress Notes (Signed)
Bp: 118/78, pulse: 64, resp: 18, spo2: 98 todays weight: 222 lbs.   Today is Debra Saunders's birthday and she states she feels good today.  She states she has energy and been doing a lot around the house.  She states her chest pain is better.  She tries to watch high sodium foods and watches her fluid intake.  Discussed diet.  She denies chest pain today, shortness of breath at rest, dizziness or headaches.  Her appetite is good, denies abdomen feeling tight.  She states she lost 2 pounds overnight, she has been up to 224 lbs for past few days.  She states been taking all her meds.  Meds verified and med box refilled, no meds left in it.  She has all her medications.  Swelling down in right leg but left leg is always swollen.  She states they are not too bad today.  Will continue to visit weekly for education on heart failure and medication compliance.   [place] [phone]

## 2018-07-02 ENCOUNTER — Telehealth: Payer: Self-pay

## 2018-07-02 ENCOUNTER — Ambulatory Visit (INDEPENDENT_AMBULATORY_CARE_PROVIDER_SITE_OTHER): Payer: Medicare HMO

## 2018-07-02 VITALS — BP 112/64 | HR 62 | Temp 98.0°F | Resp 18 | Ht 60.0 in | Wt 223.0 lb

## 2018-07-02 DIAGNOSIS — Z23 Encounter for immunization: Secondary | ICD-10-CM

## 2018-07-02 DIAGNOSIS — Z0000 Encounter for general adult medical examination without abnormal findings: Secondary | ICD-10-CM | POA: Diagnosis not present

## 2018-07-02 NOTE — Telephone Encounter (Signed)
LMOV for patient to call back.  Need to confirm if patient is currently taking Carvedilol 3.125

## 2018-07-02 NOTE — Patient Instructions (Addendum)
  Debra Saunders , Thank you for taking time to come for your Medicare Wellness Visit. I appreciate your ongoing commitment to your health goals. Please review the following plan we discussed and let me know if I can assist you in the future.   Follow up as needed.    Bring a copy of your Pony and/or Living Will to be scanned into chart.  Have a great day!  These are the goals we discussed: Goals    . Healthy lifetsyle       This is a list of the screening recommended for you and due dates:  Health Maintenance  Topic Date Due  . DEXA scan (bone density measurement)  07/01/2008  . Urine Protein Check  03/18/2016  . Eye exam for diabetics  06/13/2016  . Flu Shot  01/21/2018  . Hemoglobin A1C  10/16/2018  . Complete foot exam   01/06/2019  . Colon Cancer Screening  06/23/2021  . Tetanus Vaccine  08/04/2023  .  Hepatitis C: One time screening is recommended by Center for Disease Control  (CDC) for  adults born from [birth year] through 1967.   Completed

## 2018-07-02 NOTE — Progress Notes (Signed)
Subjective:   Debra Saunders is a 73 y.o. female who presents for Medicare Annual (Subsequent) preventive examination.  Review of Systems:  No ROS.  Medicare Wellness Visit. Additional risk factors are reflected in the social history. Cardiac Risk Factors include: advanced age (>64mn, >>61women)     Objective:     Vitals: BP 112/64 (BP Location: Right Arm, Patient Position: Sitting, Cuff Size: Normal)   Pulse 62   Temp 98 F (36.7 C) (Oral)   Resp 18   Ht 5' (1.524 m)   Wt 223 lb (101.2 kg)   SpO2 97%   BMI 43.55 kg/m   Body mass index is 43.55 kg/m.  Advanced Directives 07/02/2018 06/25/2018 06/02/2018 04/16/2018 03/09/2018 01/05/2018 12/31/2017  Does Patient Have a Medical Advance Directive? Yes Yes Yes No Yes Yes No  Type of AParamedicof [place]Living will Healthcare Power of [place]-  Does patient want to make changes to medical advance directive? - No - Patient declined No - Patient declined - No - Patient declined No - Patient declined -  Copy of HBartoloin Chart? No - copy requested - No - copy requested - No - copy requested No - copy requested -  Would patient like information on creating a medical advance directive? - No - Patient declined No - Patient declined No - Patient declined - - No - Patient declined  Pre-existing out of facility DNR order (yellow form or pink MOST form) - - - - - - -    Tobacco Social History   Tobacco Use  Smoking Status Former Smoker  . Packs/day: 0.30  . Years: 2.00  . Pack years: 0.60  . Types: Cigarettes  . Last attempt to quit: 06/23/1990  . Years since quitting: 28.0  Smokeless Tobacco Never Used     Counseling given: Not Answered   Clinical Intake:                       Past Medical History:  Diagnosis Date  . Arthritis   . Atrial fibrillation, persistent    a. s/p  TEE-guided DCCV on 08/20/2017  . Cardiorenal syndrome    a. 09/2017 Creat rose to 2.7 w/ diuresis-->HD x 2.  . Chest pain    a. 01/2012 Cath: nonobs dzs;  c. 04/2014 MV: No ischemia.  . Chronic back pain   . Chronic combined systolic (congestive) and diastolic (congestive) heart failure ([place]    a. Dx 2005 @ [facility];  b. 02/2014 Echo: Ef 55-60%, no rwma, mild MR, PASP 382mg. c. 07/2017: EF 30-35%, diffuse HK, trivial AI, severe MR, moderate TR; d. 09/2017 Echo: EF 40-45%, ant, antsep, ap, basal HK; e. 02/2018 Echo: EF 55-60%, Gr1 DD. Ao sclerosis w/o stenosis. Mild to mod MR. Mildly dil LA. Mildly reduced RV fxn. Mild TR. PASP 35-4068m.  . CMarland Kitchenronic respiratory failure (HCC)    a. on home O2  . COPD (chronic obstructive pulmonary disease) ([place]CC[place]  a. Home O2 - 3lpm  . Gastritis   . History of intracranial hemorrhage    a. 6/14 described by neurosurgery as "small frontal posttraumatic SAH " - pt was on xarelto @ the time.  . Hyperlipidemia   . Hypertension   . Lipoma of back   . Lymphedema    a. Chronic LLE edema.  . NMarland KitchenCM (nonischemic cardiomyopathy) ([place]  a. 09/2017 Echo: EF 40-45%; b. 09/2017 Cath: minor, insignificant CAD; c. 02/2018 Echo: EF 55-60%, Gr1 DD.  [name] Obesity   . PAH (pulmonary artery hypertension) ([place])    a. 09/2017 Echo: PASP 81mHg; b. 09/2017 RHC: RA 23, RV 84/13, PA 84/29, PCWP 20, CO 3.89, CI 1.89. LVEDP 18.  . Sepsis with metabolic encephalopathy ([place]   . Sleep apnea   . Streptococcal bacteremia   . Stroke ([facility]   . Symptomatic bradycardia    a. s/p BSX PPM.  . Thyroid nodule   . Type II diabetes mellitus ([place]   . Urinary incontinence    Past Surgical History:  Procedure Laterality Date  . ABDOMINAL HYSTERECTOMY  1969  . CARDIAC CATHETERIZATION  2013   [place] No obstructive CAD: Only 20% ostial left circumflex.  .Marland KitchenCARDIOVERSION N/A 10/12/2017   Procedure: CARDIOVERSION;  Surgeon: GMinna Merritts MD;  Location: [facility] ORS;  Service: Cardiovascular;  Laterality:  N/A;  . CHOLECYSTECTOMY    . CYST REMOVAL TRUNK  2002   BACK  . DIALYSIS/PERMA CATHETER INSERTION N/A 10/06/2017   Procedure: DIALYSIS/PERMA CATHETER INSERTION;  Surgeon: SKatha Cabal MD;  [facility];  Service: Cardiovascular;  Laterality: N/A;  . INSERT / REPLACE / REMOVE PACEMAKER    . lipoma removal  2014   back  . PACEMAKER INSERTION  [address] dual [address] pacemaker implanted by Dr KCaryl Comesfor symptomatic bradycardia  . PERMANENT PACEMAKER INSERTION N/A 01/09/2014   Procedure: PERMANENT PACEMAKER INSERTION;  Surgeon: SDeboraha Sprang MD;  [facility];  Service: Cardiovascular;  Laterality: N/A;  . RIGHT HEART CATH N/A 04/19/2018   Procedure: RIGHT HEART CATH;  Surgeon: MLarey Dresser MD;  [place];  Service: Cardiovascular;  Laterality: N/A;  . RIGHT/LEFT HEART CATH AND CORONARY ANGIOGRAPHY N/A 10/19/2017   Procedure: RIGHT/LEFT HEART CATH AND CORONARY ANGIOGRAPHY;  Surgeon: AWellington Hampshire MD;  [facility];  Service: Cardiovascular;  Laterality: N/A;  . TEE WITHOUT CARDIOVERSION N/A 08/20/2017   Procedure: TRANSESOPHAGEAL ECHOCARDIOGRAM (TEE) & Direct current cardioversion;  Surgeon: GMinna Merritts MD;  Location: [facility] ORS;  Service: Cardiovascular;  Laterality: N/A;  . TRIGGER FINGER RELEASE    . VAGINAL HYSTERECTOMY     Family History  Problem Relation Age of Onset  . Heart disease Mother   . Hypertension Mother   . COPD Mother        was a smoker  . Thyroid disease Mother   . Hypertension Father   . Hypertension Sister   . Thyroid disease Sister   . Hypertension Sister   . Thyroid disease Sister   . Breast cancer Maternal Aunt   . Kidney disease Neg Hx   . Bladder Cancer Neg Hx    Social History   Socioeconomic History  . Marital status: Divorced    Spouse name: Not on file  . Number of children: 4  . Years of education: 112 . Highest education level: High school graduate   Occupational History  . Occupation: Retired- factory work    [place] . Financial resource strain: Not hard at all  . Food insecurity:    Worry: Never true    Inability: Never true  . Transportation needs:    Medical: Yes    Non-medical: Yes  Tobacco Use  . Smoking status: Former Smoker    Packs/day: 0.30  Years: 2.00    Pack years: 0.60    Types: Cigarettes    Last attempt to quit: 06/23/1990    Years since quitting: 28.0  . Smokeless tobacco: Never Used  Substance and Sexual Activity  . Alcohol use: No  . Drug use: No  . Sexual activity: Not Currently  Lifestyle  . Physical activity:    Days per week: 0 days    Minutes per session: 0 min  . Stress: To some extent  Relationships  . Social connections:    Talks on phone: More than three times a week    Gets together: Once a week    Attends religious service: Never    Active member of club or organization: No    Attends meetings of clubs or organizations: Never    Relationship status: Divorced  Other Topics Concern  . Not on file  Social History Narrative   Lives in [place] with daughter. Has 4 children. No pets.      Work - Restaurant manager, fast food, retired      Diet - regular    Outpatient Encounter Medications as of 07/02/2018  Medication Sig  . acetaminophen (TYLENOL) 325 MG tablet Take 325-650 mg by mouth every 6 (six) hours as needed for mild pain. Reported on 10/10/2015  . albuterol (PROVENTIL) (2.5 MG/3ML) 0.083% nebulizer solution Take 3 mLs (2.5 mg total) by nebulization every 6 (six) hours.  [name] apixaban (ELIQUIS) 5 MG TABS tablet Take 1 tablet (5 mg total) by mouth 2 (two) times daily.  . blood glucose meter kit and supplies Dispense based on patient and insurance preference. Use up to four times daily as directed. (FOR ICD-10 E10.9, E11.9).  [name] digoxin (LANOXIN) 0.125 MG tablet Take 1 tablet (0.125 mg total) by mouth daily.  [name] gabapentin (NEURONTIN) 100 MG capsule TAKE 2  CAPSULES BY MOUTH THREE TIMES DAILY  . glucose blood (TRUE METRIX BLOOD GLUCOSE TEST) test strip Check blood glucose twice daily Dx:E11.8, Z79.4  . glucose blood test strip Use as instructed  . Lancets (ACCU-CHEK SOFT TOUCH) lancets Use as instructed  . macitentan (OPSUMIT) 10 MG tablet Take 1 tablet (10 mg total) by mouth daily.  . Menthol, Topical Analgesic, (BIOFREEZE EX) Apply 1 application topically as needed (for pain).   . metFORMIN (GLUCOPHAGE) 500 MG tablet Take 500 mg by mouth 2 (two) times daily with a meal.   . ondansetron (ZOFRAN) 8 MG tablet Take 1 tablet (8 mg total) by mouth every 8 (eight) hours as needed for nausea or vomiting.  . OXYGEN Inhale 4 L into the lungs continuous.  . potassium chloride SA (K-DUR,KLOR-CON) 20 MEQ tablet Take 1 tablet (20 mEq total) by mouth 2 (two) times daily.  . rosuvastatin (CRESTOR) 10 MG tablet Take 1 tablet (10 mg total) by mouth daily.  . sildenafil (REVATIO) 20 MG tablet Take 20 mg by mouth 3 (three) times daily.  [name] spironolactone (ALDACTONE) 25 MG tablet Take 1 tablet (25 mg total) by mouth at bedtime.  . [name] 3.75 OH/6.0OV SOPN INJECT 0.75MG SUBCUTANEOUSLY ONCE A WEEK  . torsemide (DEMADEX) 20 MG tablet Take 3 tablets (60 mg total) by mouth 2 (two) times daily.   No facility-administered encounter medications on file as of 07/02/2018.     Activities of Daily Living In your present state of health, do you have any difficulty performing the following activities: 07/02/2018 04/16/2018  Hearing? N N  Vision? N N  Difficulty concentrating or making decisions? N N  Walking  or climbing stairs? Y Y  Comment Ambulates with walker -  Dressing or bathing? Y N  Comment Home health assists -  Doing errands, shopping? Y N  Comment She does not drive Facilities manager and eating ? Y -  Comment Meals on wheels assist. Family cooks meals.  She does not cook. -  Using the Toilet? N -  In the past six months, have you accidently leaked urine? Y  -  Comment Managed with a daily brief. -  Do you have problems with loss of bowel control? N -  Managing your Medications? N -  Managing your Finances? N -  Housekeeping or managing your Housekeeping? Y -  Comment HH assist -  Some recent data might be hidden    Patient Care Team: [name], MD as PCP - General (Family Medicine) Wellington Hampshire, MD as PCP - Cardiology (Cardiology) Thom Chimes, MD as Referring Physician (Allergy) Juanito Doom, MD as Referring Physician (Internal Medicine)    Assessment:   This is a routine wellness examination for [place].  Requests meclizine for intermittent dizziness be sent to pharmacy. History dated medication last given 08/28/2017. Follow up appointment declined. Denies dizziness today. Last episode 2 weeks ago.  Oxygen 4L in use.   Lilly application to begin re-enrollment signed and faxed to [facility]).  Health Screenings  Glaucoma -none reported Hearing -passes the whisper test Hemoglobin A1C -04/16/18 (5.8) Cholesterol -08/17/17 (102)  Social  Alcohol intake -no Smoking history- former Smokers in home? none Illicit drug use? none Exercise -none Diet -regular Sexually Active -not currently  Safety  Patient feels safe at home.  Patient does have smoke detectors at home  Patient does wear sunscreen or protective clothing when in direct sunlight  Patient does wear seat belt when riding with others.   Activities of Daily Living Patient has help doing household chores, meal prep, bathing, dressing, driving. Denies needing assistance with: feeding themselves, getting from bed to chair, getting to the toileting, managing money. Ambulates with walker. Receives meals on wheels.   Depression Screen Patient denies losing interest in daily life, feeling hopeless, or crying easily over simple problems.   Fall Screen Patient denies being afraid of falling.  Memory Screen Patient denies problems with memory,  misplacing items, and is able to balance checkbook/bank accounts.  Patient is alert, normal appearance, oriented to person/place/and time. Correctly identified the president of the [place], recall of 3/3 objects, and performing simple calculations.  Patient displays appropriate judgement and can read correct time from watch face.   Immunizations The following Immunizations are up to date: Influenza, pneumonia, and tetanus. Shingles discussed.  High dose influenza vaccine administered L deltoid.  No verbal complaints before or post injection.   Other Providers Patient Care Team: [name], MD as PCP - General (Family Medicine) Wellington Hampshire, MD as PCP - Cardiology (Cardiology) Thom Chimes, MD as Referring Physician (Allergy) Juanito Doom, MD as Referring Physician (Internal Medicine)   Exercise Activities and Dietary recommendations Current Exercise Habits: The patient does not participate in regular exercise at present  Goals      Patient Stated   . Healthy lifetsyle (pt-stated)     Healthy diet Stay hydrated. Increase water intake to minimum 4 cups daily Keep all routine maintenance appointments       Fall Risk Fall Risk  07/02/2018 06/25/2018 03/24/2018 01/05/2018 11/18/2017  Falls in the past year? 0 0 No No No  Comment  No falls since last reported - - - -  Number falls in past yr: - 0 - - -  Injury with Fall? - 0 - - -  Risk for fall due to : - - - - -   Depression Screen PHQ 2/9 Scores 07/02/2018 06/25/2018 06/02/2018 03/24/2018  PHQ - 2 Score 0 0 0 1  PHQ- 9 Score - - - -     Cognitive Function     6CIT Screen 07/02/2018 07/01/2017  What Year? 0 points 0 points  What month? 0 points 0 points  What time? 0 points 0 points  Count back from 20 0 points 0 points  Months in reverse 0 points 0 points  Repeat phrase 0 points 0 points  Total Score 0 0    Immunization History  Administered Date(s) Administered  . Influenza, High Dose Seasonal PF  07/23/2016, 06/15/2017, 07/02/2018  . Influenza, Seasonal, Injecte, Preservative Fre 07/27/2012  . Influenza,inj,Quad PF,6+ Mos 06/10/2013, 05/01/2014, 03/19/2015  . Pneumococcal Conjugate-13 10/06/2013  . Pneumococcal Polysaccharide-23 04/28/2012  . Td 08/03/2013   Screening Tests Health Maintenance  Topic Date Due  . DEXA SCAN  07/01/2008  . URINE MICROALBUMIN  03/18/2016  . OPHTHALMOLOGY EXAM  06/13/2016  . INFLUENZA VACCINE  01/21/2018  . HEMOGLOBIN A1C  10/16/2018  . FOOT EXAM  01/06/2019  . COLONOSCOPY  06/23/2021  . TETANUS/TDAP  08/04/2023  . Hepatitis C Screening  Completed      Plan:    End of life planning; Advance aging; Advanced directives discussed. Copy of current HCPOA/Living Will requested.    I have personally reviewed and noted the following in the patient's chart:   . Medical and social history . Use of alcohol, tobacco or illicit drugs  . Current medications and supplements . Functional ability and status . Nutritional status . Physical activity . Advanced directives . List of other physicians . Hospitalizations, surgeries, and ER visits in previous 12 months . Vitals . Screenings to include cognitive, depression, and falls . Referrals and appointments  In addition, I have reviewed and discussed with patient certain preventive protocols, quality metrics, and best practice recommendations. A written personalized care plan for preventive services as well as general preventive health recommendations were provided to patient.     Varney Biles, LPN  [id number]

## 2018-07-04 NOTE — Progress Notes (Signed)
I have reviewed the above note and agree.  I will have the LPN contact the patient to confirm what issues she has with dizziness that would require meclizine.  It does not appear this is been refilled in several years on review of her medications.  [name], M.D.

## 2018-07-06 ENCOUNTER — Ambulatory Visit (HOSPITAL_COMMUNITY)
Admission: RE | Admit: 2018-07-06 | Discharge: 2018-07-06 | Disposition: A | Discharge status: Home / Self Care | Payer: Medicare HMO | Source: Ambulatory Visit | Attending: Cardiology | Admitting: Cardiology

## 2018-07-06 ENCOUNTER — Telehealth (HOSPITAL_COMMUNITY): Payer: Self-pay

## 2018-07-06 ENCOUNTER — Encounter (HOSPITAL_COMMUNITY): Payer: Self-pay | Admitting: Cardiology

## 2018-07-06 VITALS — BP 140/68 | HR 61 | Wt 223.6 lb

## 2018-07-06 DIAGNOSIS — J9611 Chronic respiratory failure with hypoxia: Secondary | ICD-10-CM | POA: Insufficient documentation

## 2018-07-06 DIAGNOSIS — Z79899 Other long term (current) drug therapy: Secondary | ICD-10-CM | POA: Diagnosis not present

## 2018-07-06 DIAGNOSIS — Z888 Allergy status to other drugs, medicaments and biological substances status: Secondary | ICD-10-CM | POA: Diagnosis not present

## 2018-07-06 DIAGNOSIS — E1122 Type 2 diabetes mellitus with diabetic chronic kidney disease: Secondary | ICD-10-CM | POA: Diagnosis not present

## 2018-07-06 DIAGNOSIS — Z8249 Family history of ischemic heart disease and other diseases of the circulatory system: Secondary | ICD-10-CM | POA: Diagnosis not present

## 2018-07-06 DIAGNOSIS — I2721 Secondary pulmonary arterial hypertension: Secondary | ICD-10-CM

## 2018-07-06 DIAGNOSIS — Z950 Presence of cardiac pacemaker: Secondary | ICD-10-CM | POA: Diagnosis not present

## 2018-07-06 DIAGNOSIS — Z87891 Personal history of nicotine dependence: Secondary | ICD-10-CM | POA: Insufficient documentation

## 2018-07-06 DIAGNOSIS — I2510 Atherosclerotic heart disease of native coronary artery without angina pectoris: Secondary | ICD-10-CM | POA: Diagnosis not present

## 2018-07-06 DIAGNOSIS — I890 Lymphedema, not elsewhere classified: Secondary | ICD-10-CM | POA: Diagnosis not present

## 2018-07-06 DIAGNOSIS — I130 Hypertensive heart and chronic kidney disease with heart failure and stage 1 through stage 4 chronic kidney disease, or unspecified chronic kidney disease: Secondary | ICD-10-CM | POA: Insufficient documentation

## 2018-07-06 DIAGNOSIS — N183 Chronic kidney disease, stage 3 unspecified: Secondary | ICD-10-CM

## 2018-07-06 DIAGNOSIS — J449 Chronic obstructive pulmonary disease, unspecified: Secondary | ICD-10-CM | POA: Insufficient documentation

## 2018-07-06 DIAGNOSIS — Z9989 Dependence on other enabling machines and devices: Secondary | ICD-10-CM

## 2018-07-06 DIAGNOSIS — G4733 Obstructive sleep apnea (adult) (pediatric): Secondary | ICD-10-CM

## 2018-07-06 DIAGNOSIS — I5032 Chronic diastolic (congestive) heart failure: Secondary | ICD-10-CM

## 2018-07-06 DIAGNOSIS — M5010 Cervical disc disorder with radiculopathy, unspecified cervical region: Secondary | ICD-10-CM | POA: Diagnosis not present

## 2018-07-06 DIAGNOSIS — Z886 Allergy status to analgesic agent status: Secondary | ICD-10-CM | POA: Diagnosis not present

## 2018-07-06 DIAGNOSIS — Z7984 Long term (current) use of oral hypoglycemic drugs: Secondary | ICD-10-CM | POA: Insufficient documentation

## 2018-07-06 DIAGNOSIS — Z8673 Personal history of transient ischemic attack (TIA), and cerebral infarction without residual deficits: Secondary | ICD-10-CM | POA: Insufficient documentation

## 2018-07-06 DIAGNOSIS — Z880 Allergy status to penicillin: Secondary | ICD-10-CM | POA: Diagnosis not present

## 2018-07-06 DIAGNOSIS — Z86718 Personal history of other venous thrombosis and embolism: Secondary | ICD-10-CM | POA: Insufficient documentation

## 2018-07-06 DIAGNOSIS — I480 Paroxysmal atrial fibrillation: Secondary | ICD-10-CM | POA: Insufficient documentation

## 2018-07-06 DIAGNOSIS — I5033 Acute on chronic diastolic (congestive) heart failure: Secondary | ICD-10-CM | POA: Insufficient documentation

## 2018-07-06 DIAGNOSIS — I272 Pulmonary hypertension, unspecified: Secondary | ICD-10-CM | POA: Diagnosis not present

## 2018-07-06 DIAGNOSIS — Z836 Family history of other diseases of the respiratory system: Secondary | ICD-10-CM | POA: Insufficient documentation

## 2018-07-06 DIAGNOSIS — I5043 Acute on chronic combined systolic (congestive) and diastolic (congestive) heart failure: Secondary | ICD-10-CM | POA: Diagnosis not present

## 2018-07-06 DIAGNOSIS — J8410 Pulmonary fibrosis, unspecified: Secondary | ICD-10-CM | POA: Diagnosis not present

## 2018-07-06 DIAGNOSIS — I4819 Other persistent atrial fibrillation: Secondary | ICD-10-CM | POA: Diagnosis not present

## 2018-07-06 DIAGNOSIS — Z7901 Long term (current) use of anticoagulants: Secondary | ICD-10-CM | POA: Diagnosis not present

## 2018-07-06 DIAGNOSIS — E1140 Type 2 diabetes mellitus with diabetic neuropathy, unspecified: Secondary | ICD-10-CM | POA: Diagnosis not present

## 2018-07-06 LAB — DIGOXIN LEVEL: Digoxin Level: 0.8 ng/mL (ref 0.8–2.0)

## 2018-07-06 LAB — BASIC METABOLIC PANEL
Anion gap: 9 (ref 5–15)
BUN: 20 mg/dL (ref 8–23)
CO2: 28 mmol/L (ref 22–32)
Calcium: 9.5 mg/dL (ref 8.9–10.3)
Chloride: 104 mmol/L (ref 98–111)
Creatinine, Ser: 1.33 mg/dL — ABNORMAL HIGH (ref 0.44–1.00)
GFR calc Af Amer: 45 mL/min — ABNORMAL LOW (ref >60)
GFR calc non Af Amer: 39 mL/min — ABNORMAL LOW (ref >60)
Glucose, Bld: 152 mg/dL — ABNORMAL HIGH (ref 70–99)
Potassium: 4.1 mmol/L (ref 3.5–5.1)
Sodium: 141 mmol/L (ref 135–145)

## 2018-07-06 NOTE — Telephone Encounter (Signed)
Debra Saunders Enrollment form faxed to [employer]. Confirmation received.

## 2018-07-06 NOTE — Progress Notes (Signed)
6 minute walk: Pt ambulated 97.5 meters. Discontinued walk at 4 minutes due to fatigue.  Pt with some dizziness. Mentioned is a chronic thing for her. Pt ambulated back to room safely. Pt wearing 4L [place] 02 93-99%. HR 77-83.

## 2018-07-06 NOTE — Patient Instructions (Addendum)
START [employer]. Office will submit paperwork for medicine. We will contact you once we have feedback from insurance.  Labs today We will only contact you if something comes back abnormal or we need to make some changes. Otherwise no news is good news!  Dr. [name] referred you to Pulmonary Rehab, they will contact you to schedule an appointment.   Your physician recommends that you schedule a follow-up appointment in: 6 weeks with Dr. [name]

## 2018-07-07 ENCOUNTER — Encounter: Payer: Self-pay | Admitting: Pulmonary Disease

## 2018-07-07 ENCOUNTER — Ambulatory Visit: Payer: Medicare HMO | Admitting: Pulmonary Disease

## 2018-07-07 VITALS — BP 152/80 | HR 69 | Ht 60.0 in | Wt 226.0 lb

## 2018-07-07 DIAGNOSIS — G4733 Obstructive sleep apnea (adult) (pediatric): Secondary | ICD-10-CM | POA: Diagnosis not present

## 2018-07-07 DIAGNOSIS — Z6841 Body Mass Index (BMI) 40.0 and over, adult: Secondary | ICD-10-CM | POA: Diagnosis not present

## 2018-07-07 DIAGNOSIS — Z9989 Dependence on other enabling machines and devices: Secondary | ICD-10-CM | POA: Diagnosis not present

## 2018-07-07 DIAGNOSIS — I272 Pulmonary hypertension, unspecified: Secondary | ICD-10-CM | POA: Diagnosis not present

## 2018-07-07 NOTE — Progress Notes (Signed)
Subjective:    Patient ID: Debra Saunders, female    DOB: June 17, 1946, 73 y.o.   MRN: [id number]  HPI Patient is a 73 year old former smoker (quit January 1992) who presents for follow-up of dyspnea in the setting of pulmonary hypertension and chronic diastolic dysfunction.  Patient also has been noted to have some fibrotic changes by CT scan of the chest particularly on the upper lobes.  The patient has a very complex medical history and had been following with Dr. Wallene Huh (Pulmonology [facility]) she last saw him in June 2019.  Dr. Raul Del had started her on Revatio (sildenafil) and on Opsumit  she has now been on Opsumit for approximately a month to month and a half.  She was diagnosed with pulmonary hypertension in 2004 and she was actually evaluated by the most expert physician in this country in this regard, Dr. Thom Chimes, while he was at [facility].  She has been on oxygen since then.  In March or April of this year she required increasing amounts of oxygen she was titrated to 5 L/min with exertion this was done by Dr. Raul Del.  The patient notes severe dyspnea on exertion even with activities of daily living dyspnea is relieved by rest.  She has no response to bronchodilators.  She has been told that she has COPD however prior pulmonary function testing last of which was performed in September 2018 shows mostly restrictive physiology.  With severe diffusion capacity impairment.  Reviewing her most recent CT scan of the chest performed 04/17/2018 this is compatible with "burnt out" sarcoidosis.  She has had a pacemaker placed previously due to complete heart block.  This is a common complication of cardiac involvement in sarcoidosis.  Since her initial visit here on 18 December we had requested repeat pulmonary function testing which was performed on 29 June 2018.  Her FEV1 was 1.08 L or 80% of predicted FVC was 1.40 L or 76% predicted.  FEV1/FVC was 82%.  Lung volumes are only minimally  reduced.  Diffusion capacity was severely reduced at 34%.  This notes that she does not have obstructive lung disease and her testing is consistent with mild restrictive physiology with severe diffusion capacity impairment which may be more related to her pulmonary hypertension than to the interstitial lung disease.  The patient also was noted to have very decreased ERV at 21% is consistent with obesity which also aggravates her restrictive physiology.  The patient also had overnight oximetry performed with her current auto CPAP on.  She demonstrates compliance with CPAP however she did desaturate despite CPAP to below 88%.  This was even with 4 L/min.  The implication of this is that she needs reevaluation with regards to her pressures set on the CPAP as she may need adjustment.  I suspect that she would do better with a noninvasive nocturnal ventilator.  The patient continues to complain of dyspnea however she states that she feels that the Opsumit is really helping her.  He does not note any new symptoms from her initial visit.   Review of Systems  Constitutional: Positive for fatigue.  HENT: Negative.   Eyes: Negative.   Respiratory: Positive for chest tightness and shortness of breath.   Cardiovascular: Positive for palpitations and leg swelling.  Gastrointestinal: Negative.   Endocrine: Negative.   Musculoskeletal: Negative.   Neurological: Negative.   Hematological: Negative.   All other systems reviewed and are negative.      Objective:   Physical Exam  Vitals signs reviewed.  Constitutional:      Appearance: She is morbidly obese. She is ill-appearing. She is not toxic-appearing.     Comments: Chronically ill-appearing  HENT:     Head: Normocephalic and atraumatic.     Right Ear: External ear normal.     Left Ear: External ear normal.     Nose: Nose normal.     Mouth/Throat:     Mouth: Mucous membranes are moist.     Pharynx: Oropharynx is clear.  Eyes:     General: No  scleral icterus.    Extraocular Movements: Extraocular movements intact.     Conjunctiva/sclera: Conjunctivae normal.     Pupils: Pupils are equal, round, and reactive to light.  Neck:     Musculoskeletal: Neck supple. No neck rigidity.  Cardiovascular:     Rate and Rhythm: Normal rate and regular rhythm.     Heart sounds: Murmur present. Systolic (RUSB) murmur present with a grade of 2/6.  Pulmonary:     Effort: Pulmonary effort is normal. No respiratory distress.     Breath sounds: Normal breath sounds. No wheezing.  Abdominal:     General: Abdomen is protuberant. There is no distension.  Musculoskeletal:     Right lower leg: No edema.     Left lower leg: 2+ Edema (Nonpitting) present.  Skin:    General: Skin is warm and dry.  Neurological:     General: No focal deficit present.     Mental Status: She is alert and oriented to person, place, and time.  Psychiatric:        Mood and Affect: Mood normal.        Behavior: Behavior normal.    Laboratory data with regards to connective tissue disease was negative.       Assessment & Plan:  1.  Pulmonary hypertension: There are multiple reasons for this, in particular an overlap of class II and III.  I do not believe that COPD is an issue.  Her pulmonary function is more consistent with mild restriction the severity of her diffusion capacity is more likely related to her pulmonary hypertension.  The possibility of sarcoidosis still exist however this is likely "burnt out" and as such nothing to treat the patient with this regard.  Obesity also plays a part and aggravates this issue.   Continue supplemental oxygen, diuretics and anticoagulants.  She also has significant sleep apnea and does not appear to be fully compensated with regards to oxygenation nocturnally.  See plan as below. Agree with the addition of selexipag.  Patient is awaiting approval.  2.  Chronic respiratory failure: Mostly hypoxic, continue oxygen  supplementation.  3. Obstructive sleep apnea: Patient is currently on CPAP 13 cmH2O and supplemental oxygen at 4 L bled in.  She has desaturations despite the supplementation likely related to inadequate CPAP pressure.  She will be reassessed with regards to possibility of placing her on auto VPAP versus noninvasive ventilator at night (Trilogy or similar).  4. Severe obesity with BMI of 44: This issue adds complexity to her management she has been encouraged to engage in weight loss.  5. Fibrotic changes on CT scan of the chest mostly upper lobes: She carries a diagnosis of sarcoid.  Sarcoidosis has been associated with pulmonary hypertension as well.  She also has had other potential cardiac manifestations with complete heart block requiring pacer.  No treatment for the sarcoidosis per se as the fibrotic changes have occurred and there is no acute  inflammatory component.  6. Hypertension: Patient is noted to be hypertensive on all of her visits.  This may be adding to diastolic dysfunction which is 1 of her cardiac issues.  This in turn can worsen her issues with pulmonary hypertension.  She has been started on amlodipine on 14 January.  Blood pressure was somewhat better today.   See the patient in follow-up in 6 weeks time.  In the interim, depending on what her assessment is with regards to her CPAP we will notify her of any changes if any.

## 2018-07-07 NOTE — Telephone Encounter (Signed)
I spoke with pt this morning and she mentioned that she isn't taking carvedilol at this time.

## 2018-07-07 NOTE — Progress Notes (Signed)
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  Admitted 10/24-10/29/19 with acute on chronic diastolic HF. HF team consulted with concerns for low output. PICC was placed and milrinone to support RV function was started along with IV Lasix to facilitate diuresis. RHC done to evaluate PAH (see below). V/Q scan negative. High res CT showed pulmonary fibrosis. Pulmonary consult and recommended stopping amiodarone. She was transitioned to torsemide 60 mg BID. DC weight: 226 lbs.   She returns for followup of CHF, chronic respiratory failure, and pulmonary hypertension.  She remains limited.  Still short of breath walking 20-30 feet, though she feels like her breathing has improved some with initiation of Opsumit.  She is using her CPAP. She continues on home oxygen.  No orthopnea/PND. No lightheadedness or syncope.  No chest pain.   6 minute walk (1/20): 98 m  Labs (11/19): digoxin 0.8 Labs (1/20): K 4.2, creatinine 1.36  PMH: 1. Lymphedema: Left lower leg (chronic).  2. COPD 3. 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  - PFTs (2017) with FEV1 64%, FEV1/FVC ratio 106%, TLC 49%, DLCO 39% (restrictive).  - CT chest high resolution (10/19): Pulmonary parenchymal pattern of fibrosis.  4. Atrial fibrillation: Paroxysmal.  She is off amiodarone due to lung disease.  - 2/19 DCCV.  5. H/o DVT s/p IVC filter in 2004.  6. CAD: 4/19 coronary angiography with minimal coronary disease.  7. Bradycardia: [employer] PPM.  8. H/o traumatic SAH 2014.  9. CKD: Stage 3.  10. H/o CVA 11. OSA: CPAP 12. HTN 13. Type II diabetes 14. Pulmonary hypertension:  - PFTs 2017 with severe restriction.  - Echo (10/19): EF 60-65%, PASP 52 mmHg - RHC (4/19): Severe PAH with PVR 7.2 WU.  - RHC (10/19): mean RA 6, PA 56/17 mean 33, PCWP mean 12, CI 3.23, PVR 3.4 WU.  - V/Q scan (10/19): No chronic PE.  - Anti-SCL 70 and ANA negative.  ACE normal.  RF slightly elevated at 15.  FH: mother with CHF 6. 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   SH: Former smoker 1 pack/week x 2-3 years, quit 28 years ago. No ETOH or drug use. 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 Lives alone in [place]. Transportation is an issue.   ROS: Review of systems complete and found to be negative unless listed in HPI.    Current Outpatient Medications  Medication Sig Dispense Refill  . acetaminophen (TYLENOL) 325 MG tablet Take 325-650 mg by mouth every 6 (six) hours as needed for mild pain. Reported on 10/10/2015    . albuterol (PROVENTIL) (2.5 MG/3ML) 0.083% nebulizer solution Take 3 mLs (2.5 mg total) by nebulization every 6 (six) hours. 75 mL 12  . apixaban (ELIQUIS) 5 MG TABS tablet Take 1 tablet (5 mg total) by mouth 2 (two) times daily. 60 tablet 0  . blood glucose meter kit and supplies Dispense based on patient and insurance preference. Use up to four times daily as directed. (FOR ICD-10 E10.9, E11.9). 1 each 0  . digoxin (LANOXIN) 0.125 MG tablet Take 1 tablet (0.125  mg total) by mouth daily. 30 tablet 3  . gabapentin (NEURONTIN) 100 MG capsule TAKE 2 CAPSULES BY MOUTH THREE TIMES DAILY 180 capsule 0  . glucose blood (TRUE METRIX BLOOD GLUCOSE TEST) test strip Check blood glucose twice daily Dx:E11.8, Z79.4 100 each 12  . glucose blood test strip Use as instructed 100 each 12  . Lancets (ACCU-CHEK SOFT TOUCH) lancets Use as instructed 100 each 12  . macitentan (OPSUMIT) 10 MG tablet Take 1 tablet  (10 mg total) by mouth daily. 30 tablet 3  . Menthol, Topical Analgesic, (BIOFREEZE EX) Apply 1 application topically as needed (for pain).     . metFORMIN (GLUCOPHAGE) 500 MG tablet Take 500 mg by mouth 2 (two) times daily with a meal.     . ondansetron (ZOFRAN) 8 MG tablet Take 1 tablet (8 mg total) by mouth every 8 (eight) hours as needed for nausea or vomiting. 30 tablet 2  . OXYGEN Inhale 4 L into the lungs continuous.    . potassium chloride SA (K-DUR,KLOR-CON) 20 MEQ tablet Take 1 tablet (20 mEq total) by mouth 2 (two) times daily. 180 tablet 3  . rosuvastatin (CRESTOR) 10 MG tablet Take 1 tablet (10 mg total) by mouth daily. 90 tablet 3  . sildenafil (REVATIO) 20 MG tablet Take 20 mg by mouth 3 (three) times daily.    [name] spironolactone (ALDACTONE) 25 MG tablet Take 1 tablet (25 mg total) by mouth at bedtime. 30 tablet 3  . torsemide (DEMADEX) 20 MG tablet Take 3 tablets (60 mg total) by mouth 2 (two) times daily. 170 tablet 0  . [name] 0.17 CB/4.4HQ SOPN INJECT 0.75MG SUBCUTANEOUSLY ONCE A WEEK 4 pen 2   No current facility-administered medications for this encounter.     Allergies  Allergen Reactions  . Aspirin Hives and Other (See Comments)    Pt states that she is unable to take because she had bleeding in her brain  . Other Other (See Comments)    Pt states that she is unable to take blood thinners because she had bleeding in her brain; Blood Thinners-per doctor at [facility]  . Penicillins Hives, Shortness Of Breath, Swelling and Other (See Comments)    Has patient had a PCN reaction causing immediate rash, facial/tongue/throat swelling, SOB or lightheadedness with hypotension: Yes Has patient had a PCN reaction causing severe rash involving mucus membranes or skin necrosis: No Has patient had a PCN reaction that required hospitalization No Has patient had a PCN reaction occurring within the last 10 years: No If all of the above answers are "NO", then may proceed with  Cephalosporin use.      Social History   Socioeconomic History  . Marital status: Divorced    Spouse name: Not on file  . Number of children: 4  . Years of education: 24  . Highest education level: High school graduate  Occupational History  . Occupation: Retired- factory work    Comment: exposed to [place]  . Financial resource strain: Not hard at all  . Food insecurity:    Worry: Never true    Inability: Never true  . Transportation needs:    Medical: Yes    Non-medical: Yes  Tobacco Use  . Smoking status: Former Smoker    Packs/day: 0.30    Years: 2.00    Pack years: 0.60    Types: Cigarettes    Last attempt to quit: 06/23/1990    Years since quitting: 28.0  .  Smokeless tobacco: Never Used  Substance and Sexual Activity  . Alcohol use: No  . Drug use: No  . Sexual activity: Not Currently  Lifestyle  . Physical activity:    Days per week: 0 days    Minutes per session: 0 min  . Stress: To some extent  Relationships  . Social connections:    Talks on phone: More than three times a week    Gets together: Once a week    Attends religious service: Never    Active member of club or organization: No    Attends meetings of clubs or organizations: Never    Relationship status: Divorced  . Intimate partner violence:    Fear of current or ex partner: Patient refused    Emotionally abused: Patient refused    Physically abused: Patient refused    Forced sexual activity: Patient refused  Other Topics Concern  . Not on file  Social History Narrative   Lives in [place] with daughter. Has 4 children. No pets.      Work - Restaurant manager, fast food, retired      Diet - regular      Family History  Problem Relation Age of Onset  . Heart disease Mother   . Hypertension Mother   . COPD Mother        was a smoker  . Thyroid disease Mother   . Hypertension Father   . Hypertension Sister   . Thyroid disease Sister   . Hypertension Sister   . Thyroid disease  Sister   . Breast cancer Maternal Aunt   . Kidney disease Neg Hx   . Bladder Cancer Neg Hx     Vitals:   07/06/18 1442 07/06/18 1448  BP: (!) 180/70 140/68  Pulse: 61   SpO2: 93%   Weight: 101.4 kg (223 lb 9.6 oz)    Wt Readings from Last 3 Encounters:  07/06/18 101.4 kg (223 lb 9.6 oz)  07/02/18 101.2 kg (223 lb)  07/01/18 100.7 kg (222 lb)    PHYSICAL EXAM: General: NAD Neck: No JVD, no thyromegaly or thyroid nodule.  Lungs: Clear to auscultation bilaterally with normal respiratory effort. CV: Nondisplaced PMI.  Heart regular S1/S2, no S3/S4, 1/6 SEM RUSB.  Right leg no edema, left leg 2+ chronic edema to knee.  No carotid bruit.  Normal pedal pulses.  Abdomen: Soft, nontender, no hepatosplenomegaly, no distention.  Skin: Intact without lesions or rashes.  Neurologic: Alert and oriented x 3.  Psych: Normal affect. Extremities: No clubbing or cyanosis.  HEENT: Normal.   ASSESSMENT & PLAN:  1. 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 Echo 10/19 showed EF 60-65% with moderate LVH, RV was difficult to see. During admission in 10/19, she required milrinone to support RV.  On exam today, she is not volume overloaded.  Chronic NYHA class IIIb symptoms, likely due primarily to lung disease and pulmonary hypertension.  - Continue torsemide 60 mg bid, BMET today.  2. Pulmonary hypertension: She was followed in the past at [facility].  Based on 4/19 cath, she had severe PAH with PVR 7.2 WU. She has been on sildenafil 20 mg tid x 4 years. Chronic hypoxemic respiratory failure, on home oxygen. She has a history of remote DVT (2004) but V/Q scan negative for evidence of chronic PE. High resolution CT chest in 10/19 showed pulmonary fibrosis, and last PFTs in 2017 showed severe restriction.  Serological workup for rheumatological diseases was unremarkable.  There has been concern for  possible burnt-out sarcoidosis as cause of her interstitial fibrosis and hilar adenopathy.   She has family history of this. Amiodarone was stopped given lung parenchymal disease. 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 Repeat RHC in 10/19 showed moderate pulmonary hypertension, PVR 3.4 WU which is significantly lower than on the 4/19 cath. Still very limited based on 6 minute walk today.  - Continue sildenafil and Opsumit.  She feels like Opsumit has helped her breathing so far . - I will arrange to try her on selexipag.   - Continue digoxin for RV support, check digoxin level today.  - Continue CPAP and home oxygen.  - Followup with pulmonary for ILD.  3. CKD stage 3: BMET today.  4. Atrial fibrillation: Paroxysmal.  Off amiodarone with parenchymal lung disease.  - Continue apixaban 5 mg bid.  5. OSA: Continue CPAP. 6. HTN: BP continually runs significantly high.   - Add amlodipine 5 mg daily.   Followup in 6 wks.   [name] 07/07/2018

## 2018-07-07 NOTE — Patient Instructions (Signed)
1.  We will check with [facility] with regards to new equipment and need for potential new testing for your obstructive sleep apnea.  2.  Continue your medications for pulmonary hypertension.  3.  We will see you in follow-up in 6 to 8 weeks time.

## 2018-07-08 ENCOUNTER — Other Ambulatory Visit (HOSPITAL_COMMUNITY): Payer: Self-pay

## 2018-07-08 ENCOUNTER — Ambulatory Visit: Payer: Medicare HMO | Admitting: Nurse Practitioner

## 2018-07-08 ENCOUNTER — Encounter (HOSPITAL_COMMUNITY): Payer: Self-pay

## 2018-07-08 ENCOUNTER — Other Ambulatory Visit: Payer: Self-pay | Admitting: Family Medicine

## 2018-07-08 MED ORDER — MECLIZINE HCL 25 MG PO TABS: 25.0000 mg | ORAL_TABLET | Freq: Three times a day (TID) | ORAL | 0 refills | Status: DC | PRN

## 2018-07-08 MED ORDER — AMLODIPINE BESYLATE 5 MG PO TABS: 5.0000 mg | ORAL_TABLET | Freq: Every day | ORAL | 3 refills | Status: DC

## 2018-07-08 NOTE — Addendum Note (Signed)
Addended by: [name], Avree Szczygiel G on: 07/08/2018 12:21 PM   Modules accepted: Orders

## 2018-07-08 NOTE — Progress Notes (Signed)
Bp: 164/72, pulse: 62, resp: 18, spo2: 97, last weight: 222, todays: 226 lbs  Debra Saunders states she feels pretty good.  She states went to cardiology and she is aware of changes in meds and meds to come.  She is setting up pulmonary rehab, they called while I was there.  Meds verified and med box refilled.  She states will pick up amlodipine from pharmacy and place in box.  She states her weight is up, she states missed her torsemide 2 days ago.  She states been urinating today.  Advised her if weight did not come down today after taking all her meds to call me in the morning.  She denies shortness of breath at rest.  Legs swelling about same for her.  She denies abdominal tightness.  She denies chest pain or headaches.  She deos state she has been dizzy and her doctor called her in meclizine, she thought could be vertigo acting up.  Lungs sounds are clear.  Will continue to visit for heart failure.   [place] [phone]

## 2018-07-09 ENCOUNTER — Telehealth (HOSPITAL_COMMUNITY): Payer: Self-pay

## 2018-07-09 DIAGNOSIS — I130 Hypertensive heart and chronic kidney disease with heart failure and stage 1 through stage 4 chronic kidney disease, or unspecified chronic kidney disease: Secondary | ICD-10-CM | POA: Diagnosis not present

## 2018-07-09 DIAGNOSIS — I2510 Atherosclerotic heart disease of native coronary artery without angina pectoris: Secondary | ICD-10-CM | POA: Diagnosis not present

## 2018-07-09 DIAGNOSIS — I4819 Other persistent atrial fibrillation: Secondary | ICD-10-CM | POA: Diagnosis not present

## 2018-07-09 DIAGNOSIS — N183 Chronic kidney disease, stage 3 (moderate): Secondary | ICD-10-CM | POA: Diagnosis not present

## 2018-07-09 DIAGNOSIS — J449 Chronic obstructive pulmonary disease, unspecified: Secondary | ICD-10-CM | POA: Diagnosis not present

## 2018-07-09 DIAGNOSIS — I5043 Acute on chronic combined systolic (congestive) and diastolic (congestive) heart failure: Secondary | ICD-10-CM | POA: Diagnosis not present

## 2018-07-09 DIAGNOSIS — M5010 Cervical disc disorder with radiculopathy, unspecified cervical region: Secondary | ICD-10-CM | POA: Diagnosis not present

## 2018-07-09 DIAGNOSIS — E1140 Type 2 diabetes mellitus with diabetic neuropathy, unspecified: Secondary | ICD-10-CM | POA: Diagnosis not present

## 2018-07-09 DIAGNOSIS — I2721 Secondary pulmonary arterial hypertension: Secondary | ICD-10-CM | POA: Diagnosis not present

## 2018-07-09 NOTE — Telephone Encounter (Signed)
PA submitted to [facility] for Uptravi via fax (receipt confirmed). Filed in [place] folder.

## 2018-07-09 NOTE — Telephone Encounter (Signed)
Debra Saunders contacted me this am to advised that she is down 2 pounds.  She states feels pretty good, does not feel full today.  Her breathing is at her normal.  She feels like she is on the way back down.  She states she will take all her meds today and prop her feet up.  Advised her not to miss her torsemide and watch her diet, she missed them 2 days ago.  Will visit next week and advised her any more weight gain to call me or her cardiology.   [place] [phone]

## 2018-07-12 ENCOUNTER — Telehealth: Payer: Self-pay

## 2018-07-12 NOTE — Telephone Encounter (Signed)
[place] on [facility] sent a refill request on Carvedilol 3.125 mg one tablet twice a day.  Looking at Dr. Claris Gladden last office note the Carvedilol was not on medications list.  Please review if okay for the patient to have the 90 day supply refill for Carvedilol.

## 2018-07-12 NOTE — Telephone Encounter (Signed)
Call placed to the patient to confirm she no longer takes the Carvedilol. She has confirmed this. This was discontinued at her hospital visit on 04/20/18.   Call placed to [facility] to inform them to discontinue the Carvedilol.

## 2018-07-13 ENCOUNTER — Ambulatory Visit
Admission: RE | Admit: 2018-07-13 | Discharge: 2018-07-13 | Disposition: A | Discharge status: Home / Self Care | Payer: Medicare HMO | Source: Ambulatory Visit | Attending: Family Medicine | Admitting: Family Medicine

## 2018-07-13 DIAGNOSIS — E042 Nontoxic multinodular goiter: Secondary | ICD-10-CM | POA: Diagnosis not present

## 2018-07-13 DIAGNOSIS — E049 Nontoxic goiter, unspecified: Secondary | ICD-10-CM | POA: Diagnosis not present

## 2018-07-15 ENCOUNTER — Telehealth: Payer: Self-pay | Admitting: Nurse Practitioner

## 2018-07-15 ENCOUNTER — Other Ambulatory Visit: Payer: Self-pay

## 2018-07-15 ENCOUNTER — Other Ambulatory Visit: Payer: Self-pay | Admitting: Pharmacy Technician

## 2018-07-15 ENCOUNTER — Encounter (HOSPITAL_COMMUNITY): Payer: Self-pay

## 2018-07-15 ENCOUNTER — Other Ambulatory Visit (HOSPITAL_COMMUNITY): Payer: Self-pay

## 2018-07-15 DIAGNOSIS — Z9989 Dependence on other enabling machines and devices: Principal | ICD-10-CM

## 2018-07-15 DIAGNOSIS — J9611 Chronic respiratory failure with hypoxia: Secondary | ICD-10-CM

## 2018-07-15 DIAGNOSIS — G4733 Obstructive sleep apnea (adult) (pediatric): Secondary | ICD-10-CM

## 2018-07-15 LAB — CUP PACEART REMOTE DEVICE CHECK
Battery Remaining Longevity: 102 mo
Battery Remaining Percentage: 100 %
Brady Statistic RA Percent Paced: 40 %
Brady Statistic RV Percent Paced: 19 %
Date Time Interrogation Session: 20191209120100
Implantable Lead Implant Date: 20150720
Implantable Lead Implant Date: 20150720
Implantable Lead Location: 753859
Implantable Lead Location: 753860
Implantable Lead Model: 5076
Implantable Lead Model: 5076
Implantable Pulse Generator Implant Date: 20150720
Lead Channel Impedance Value: 376 Ohm
Lead Channel Impedance Value: 504 Ohm
Lead Channel Pacing Threshold Amplitude: 0.9 V
Lead Channel Pacing Threshold Pulse Width: 0.4 ms
Lead Channel Setting Pacing Amplitude: 2 V
Lead Channel Setting Pacing Amplitude: 2.4 V
Lead Channel Setting Pacing Pulse Width: 0.4 ms
Lead Channel Setting Sensing Sensitivity: 2.5 mV
Pulse Gen Serial Number: 390330

## 2018-07-15 NOTE — Progress Notes (Signed)
Bp: 146/80, pulse: 68, resp: 18, spo2: 97, last weight: 226 lbs, today: 223 lbs  Today when I arrived Debra Saunders states she dont feel well.  She states no energy and feels weird.  She finally told me she has blood in her stools and been having it for past 3 weeks on and off.  Asked about hemorrhoids, she states she does not.  She states been dark and bright red.  She states today was like 2 blood clots.  Contacted her GI doctor and advised them and they have set up appt for her to be seen.  Advised her if got worst she needs to go to ER for evaluation.  Lungs clear, her weight is down from last week.  Denies chest pain, headache, shortness of breath at rest or dizziness.  She has been taking all her meds.  Meds verified.  Med box refilled.  Will continue to visit weekly to educate on heart failure and med compliant.   [place] [phone]

## 2018-07-15 NOTE — Telephone Encounter (Signed)
Pt would like to know if it is ok she takes Prilosec for her heartburn. Please call and advise.

## 2018-07-15 NOTE — Patient Outreach (Signed)
[facility]) Care Management  07/15/2018  Debra Saunders 30-Jun-1945 [id number]   Working along with [place] to re-enroll patient's assistance thru [facility] for patients [name]. Received patient and provider portions of application via fax from Dr. Ellen Henri office. Faxed completed application and required documents into [employer].  Will follow up with company in 7-10 business days to check status of application.  Maud Deed Chana Bode [place] Certified Pharmacy Technician [place] Management Direct [phone]

## 2018-07-15 NOTE — Telephone Encounter (Signed)
Patient advised to call her PCP concerning the Prilosec. She verbalized understanding.

## 2018-07-16 ENCOUNTER — Telehealth (HOSPITAL_COMMUNITY): Payer: Self-pay

## 2018-07-16 DIAGNOSIS — I509 Heart failure, unspecified: Secondary | ICD-10-CM | POA: Diagnosis not present

## 2018-07-16 NOTE — Telephone Encounter (Signed)
Prior authorization through [employer] was APPROVED for Debra Saunders and will expire on 12/31/20250.

## 2018-07-19 ENCOUNTER — Other Ambulatory Visit: Payer: Self-pay | Admitting: Family Medicine

## 2018-07-21 ENCOUNTER — Telehealth (HOSPITAL_COMMUNITY): Payer: Self-pay

## 2018-07-21 NOTE — Telephone Encounter (Signed)
Prescription request for Uptravi faxed to [facility], signed by MD [name]

## 2018-07-22 ENCOUNTER — Other Ambulatory Visit (HOSPITAL_COMMUNITY): Payer: Self-pay

## 2018-07-22 ENCOUNTER — Encounter (HOSPITAL_COMMUNITY): Payer: Self-pay

## 2018-07-22 MED ORDER — SELEXIPAG 200 MCG PO TABS: 200.0000 ug | ORAL_TABLET | Freq: Two times a day (BID) | ORAL | 3 refills | Status: DC

## 2018-07-22 NOTE — Progress Notes (Signed)
Bp: 138/78, pulse: 68, resp: 18, spo2: 96 last weight: 223 todays: 222 lbs  Today Debra Saunders states she feels good.  She denies shortness of breath, headaches, dizziness or chest pain.  She states been up early, ate, did laundry and cooked.  She states chest pain has subsided.  Meds verified and med box refilled.  Called in refills for opsumit and sildenafil.  Swelling is down some, still a lot in left leg.  She is aware of appts coming up.  She has cancelled PT at this time til finds a ride, they are working on her transportation vouchers.  Lungs are clear. She states still has days where she has blood in stool, she has that appt Monday Feb 3.  Will Continue to visit weekly for heart failure.   [place] [phone]

## 2018-07-26 ENCOUNTER — Ambulatory Visit: Payer: Medicare HMO

## 2018-07-26 ENCOUNTER — Telehealth: Payer: Self-pay

## 2018-07-26 NOTE — Telephone Encounter (Signed)
trulicity 6.94 mg sample boxes are ready for pick up   Sent to PCP to review labels  5 boxes   Called and spoke with patient. Pt advised and voiced understanding.   PCP did review labels.   Pt stated that he daughter will come by to pick this up.

## 2018-07-27 ENCOUNTER — Other Ambulatory Visit: Payer: Self-pay | Admitting: Pharmacy Technician

## 2018-07-27 NOTE — Patient Outreach (Signed)
[facility]) Care Management  07/27/2018  ARCHER VISE [birth year] [id number]   Received in basket from [place] that patient has been contacted to pick up [name] that was approved on 1/28 until 06/23/19 from [employer].  Will remove myself from care team now that patient assistance has been completed for [name].  Maud Deed Chana Bode [place] Certified Pharmacy Technician [place] Management Direct Dial:[phone]

## 2018-07-28 DIAGNOSIS — J9611 Chronic respiratory failure with hypoxia: Secondary | ICD-10-CM | POA: Diagnosis not present

## 2018-07-28 DIAGNOSIS — Z8759 Personal history of other complications of pregnancy, childbirth and the puerperium: Secondary | ICD-10-CM | POA: Diagnosis not present

## 2018-07-28 DIAGNOSIS — K760 Fatty (change of) liver, not elsewhere classified: Secondary | ICD-10-CM | POA: Diagnosis not present

## 2018-07-29 ENCOUNTER — Other Ambulatory Visit (HOSPITAL_COMMUNITY): Payer: Self-pay | Admitting: *Deleted

## 2018-07-29 ENCOUNTER — Other Ambulatory Visit (HOSPITAL_COMMUNITY): Payer: Self-pay

## 2018-07-29 MED ORDER — TORSEMIDE 20 MG PO TABS: 60.0000 mg | ORAL_TABLET | Freq: Two times a day (BID) | ORAL | 0 refills | Status: DC

## 2018-07-29 NOTE — Progress Notes (Signed)
Bp: 158/70, pulse: 62, resp: 18, spo2: 96 last weight: 223, todays: 220 lbs  Sya today states she feels bad, like she is in a fog.  She states ran out of oxygen at her doctors appt yesterday.  She states when she gets out it takes her a couple of days to get back to feeling good.  She denies chest pain, shortness of breath at rest, headaches or dizziness.  Swelling is about her normal.  She has not received her new medication yet.  Meds verified and med box refilled.  Appetite has been good, she has got up and cooked breakfast this am.  She states been doing a little bit more than she was everyday around the house.  Will continue to visit weekly for heart failure. Called in refills for torsemide and diltiazem.   [place] [phone]

## 2018-08-02 ENCOUNTER — Other Ambulatory Visit: Payer: Self-pay | Admitting: Family Medicine

## 2018-08-02 DIAGNOSIS — E042 Nontoxic multinodular goiter: Secondary | ICD-10-CM

## 2018-08-04 ENCOUNTER — Other Ambulatory Visit: Payer: Self-pay | Admitting: Family Medicine

## 2018-08-04 ENCOUNTER — Telehealth (HOSPITAL_COMMUNITY): Payer: Self-pay

## 2018-08-04 ENCOUNTER — Other Ambulatory Visit (HOSPITAL_COMMUNITY): Payer: Self-pay

## 2018-08-04 ENCOUNTER — Telehealth: Payer: Self-pay | Admitting: Cardiovascular Disease

## 2018-08-04 ENCOUNTER — Encounter (HOSPITAL_COMMUNITY): Payer: Self-pay

## 2018-08-04 ENCOUNTER — Other Ambulatory Visit (HOSPITAL_COMMUNITY): Payer: Self-pay | Admitting: *Deleted

## 2018-08-04 MED ORDER — DIGOXIN 125 MCG PO TABS: 0.1250 mg | ORAL_TABLET | Freq: Every day | ORAL | 3 refills | Status: DC

## 2018-08-04 NOTE — Progress Notes (Signed)
Bp: 140/80, pulse: 64, resp: 18, spo2: 97 last weight: 220 todays: 220 lbs  Today Debra Saunders states she does not feel well, she is not sure if coming down with a cold or what.  She states she feels like she has a fever, temp taken.  Lungs clear, denies dizziness, has a headache, and denies chest pain.  She states she gets headaches a lot.  She states she has eaten but do not really want to eat.  She states she has forced her self to stay busy today and not sit around.  She did some light house cleaning and baked some.  Swelling in extremities are her normal.  Abdomen does not feel tight.  She has taken her meds, meds verified and meds refilled.  Eliquis is out in 1 week and 3 days, she states they normally call her by now, contacted them and they advised it stopped in December, the foundation is sending her the paperwork to renew but will not be in time before she runs out.  Contacted Dr Jacklynn Ganong office and asked for samples for her, they states will check and call. Back.  Called in gabapentin, spirolactone and digoxin.  Will continue to visit for heart failure and med compliance.   [place] [phone]

## 2018-08-04 NOTE — Telephone Encounter (Signed)
Pt Eliquis application has expired and would like some samples. States she has a week and 3 days left.

## 2018-08-04 NOTE — Telephone Encounter (Signed)
Risk assessment request faxed with DM signature

## 2018-08-04 NOTE — Telephone Encounter (Signed)
Refilled: 07/08/2018 Last OV: 06/25/2018 Next OV: 09/24/2018

## 2018-08-05 NOTE — Telephone Encounter (Signed)
I spoke with pt today and she mentioned that she is in the process of receiving forms from [facility] for [place]. Pt is aware that I have placed samples of Eliquis 5 mg tablet upfront for pick up.

## 2018-08-16 DIAGNOSIS — I509 Heart failure, unspecified: Secondary | ICD-10-CM | POA: Diagnosis not present

## 2018-08-20 ENCOUNTER — Ambulatory Visit (HOSPITAL_COMMUNITY)
Admission: RE | Admit: 2018-08-20 | Discharge: 2018-08-20 | Disposition: A | Discharge status: Home / Self Care | Payer: Medicare HMO | Source: Ambulatory Visit | Attending: Cardiology | Admitting: Cardiology

## 2018-08-20 VITALS — BP 156/80 | HR 62 | Wt 220.6 lb

## 2018-08-20 DIAGNOSIS — I890 Lymphedema, not elsewhere classified: Secondary | ICD-10-CM | POA: Diagnosis not present

## 2018-08-20 DIAGNOSIS — I5032 Chronic diastolic (congestive) heart failure: Secondary | ICD-10-CM

## 2018-08-20 DIAGNOSIS — Z79899 Other long term (current) drug therapy: Secondary | ICD-10-CM | POA: Insufficient documentation

## 2018-08-20 DIAGNOSIS — Z8673 Personal history of transient ischemic attack (TIA), and cerebral infarction without residual deficits: Secondary | ICD-10-CM | POA: Insufficient documentation

## 2018-08-20 DIAGNOSIS — Z8249 Family history of ischemic heart disease and other diseases of the circulatory system: Secondary | ICD-10-CM | POA: Diagnosis not present

## 2018-08-20 DIAGNOSIS — N183 Chronic kidney disease, stage 3 (moderate): Secondary | ICD-10-CM | POA: Insufficient documentation

## 2018-08-20 DIAGNOSIS — Z7984 Long term (current) use of oral hypoglycemic drugs: Secondary | ICD-10-CM | POA: Diagnosis not present

## 2018-08-20 DIAGNOSIS — Z9981 Dependence on supplemental oxygen: Secondary | ICD-10-CM | POA: Diagnosis not present

## 2018-08-20 DIAGNOSIS — I130 Hypertensive heart and chronic kidney disease with heart failure and stage 1 through stage 4 chronic kidney disease, or unspecified chronic kidney disease: Secondary | ICD-10-CM | POA: Insufficient documentation

## 2018-08-20 DIAGNOSIS — J8410 Pulmonary fibrosis, unspecified: Secondary | ICD-10-CM | POA: Insufficient documentation

## 2018-08-20 DIAGNOSIS — Z7901 Long term (current) use of anticoagulants: Secondary | ICD-10-CM | POA: Diagnosis not present

## 2018-08-20 DIAGNOSIS — G4733 Obstructive sleep apnea (adult) (pediatric): Secondary | ICD-10-CM | POA: Diagnosis not present

## 2018-08-20 DIAGNOSIS — I2510 Atherosclerotic heart disease of native coronary artery without angina pectoris: Secondary | ICD-10-CM | POA: Insufficient documentation

## 2018-08-20 DIAGNOSIS — I480 Paroxysmal atrial fibrillation: Secondary | ICD-10-CM | POA: Insufficient documentation

## 2018-08-20 DIAGNOSIS — I5033 Acute on chronic diastolic (congestive) heart failure: Secondary | ICD-10-CM | POA: Insufficient documentation

## 2018-08-20 DIAGNOSIS — I272 Pulmonary hypertension, unspecified: Secondary | ICD-10-CM | POA: Diagnosis not present

## 2018-08-20 DIAGNOSIS — J449 Chronic obstructive pulmonary disease, unspecified: Secondary | ICD-10-CM | POA: Diagnosis not present

## 2018-08-20 DIAGNOSIS — E1122 Type 2 diabetes mellitus with diabetic chronic kidney disease: Secondary | ICD-10-CM | POA: Insufficient documentation

## 2018-08-20 DIAGNOSIS — I5022 Chronic systolic (congestive) heart failure: Secondary | ICD-10-CM

## 2018-08-20 DIAGNOSIS — J9611 Chronic respiratory failure with hypoxia: Secondary | ICD-10-CM | POA: Insufficient documentation

## 2018-08-20 DIAGNOSIS — I2721 Secondary pulmonary arterial hypertension: Secondary | ICD-10-CM

## 2018-08-20 DIAGNOSIS — Z87891 Personal history of nicotine dependence: Secondary | ICD-10-CM | POA: Diagnosis not present

## 2018-08-20 DIAGNOSIS — Z86718 Personal history of other venous thrombosis and embolism: Secondary | ICD-10-CM | POA: Insufficient documentation

## 2018-08-20 LAB — BASIC METABOLIC PANEL
Anion gap: 11 (ref 5–15)
BUN: 20 mg/dL (ref 8–23)
CO2: 26 mmol/L (ref 22–32)
Calcium: 10 mg/dL (ref 8.9–10.3)
Chloride: 101 mmol/L (ref 98–111)
Creatinine, Ser: 1.32 mg/dL — ABNORMAL HIGH (ref 0.44–1.00)
GFR calc Af Amer: 46 mL/min — ABNORMAL LOW (ref >60)
GFR calc non Af Amer: 39 mL/min — ABNORMAL LOW (ref >60)
Glucose, Bld: 166 mg/dL — ABNORMAL HIGH (ref 70–99)
Potassium: 4.3 mmol/L (ref 3.5–5.1)
Sodium: 138 mmol/L (ref 135–145)

## 2018-08-20 LAB — DIGOXIN LEVEL: Digoxin Level: 0.2 ng/mL — ABNORMAL LOW (ref 0.8–2.0)

## 2018-08-20 MED ORDER — APIXABAN 5 MG PO TABS: 5.0000 mg | ORAL_TABLET | Freq: Two times a day (BID) | ORAL | 11 refills | Status: DC

## 2018-08-20 MED ORDER — AMLODIPINE BESYLATE 10 MG PO TABS: 10.0000 mg | ORAL_TABLET | Freq: Every day | ORAL | 6 refills | Status: DC

## 2018-08-20 NOTE — Progress Notes (Signed)
Pt brought in Eliquis assistance application for MD to complete  CSW assisted in completing application and faxed in for review- fax confirmation received  CSW will continue to follow and assist as needed  Jorge Ny, [place] Worker [facility] [phone]

## 2018-08-20 NOTE — Patient Instructions (Addendum)
INCREASE Amlodipine (Norvasc) to 10mg  ( 1tab) daily  Please call [phone] to arrange shipment of your medicine [facility] today We will only contact you if something comes back abnormal or we need to make some changes. Otherwise no news is good news!  Your physician recommends that you schedule a follow-up appointment in: 6 weeks with Dr. [name]

## 2018-08-22 ENCOUNTER — Telehealth: Payer: Self-pay | Admitting: Family Medicine

## 2018-08-22 DIAGNOSIS — Z1239 Encounter for other screening for malignant neoplasm of breast: Secondary | ICD-10-CM

## 2018-08-22 NOTE — Telephone Encounter (Signed)
Please contact the patient and let her know that I received a reminder that she is due for a mammogram.  If she is willing to proceed with one I can place an order.  Thanks.

## 2018-08-22 NOTE — Progress Notes (Signed)
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  Admitted 10/24-10/29/19 with acute on chronic diastolic HF. HF team consulted with concerns for low output. PICC was placed and milrinone to support RV function was started along with IV Lasix to facilitate diuresis. RHC done to evaluate PAH (see below). V/Q scan negative. High res CT showed pulmonary fibrosis. Pulmonary consult and recommended stopping amiodarone. She was transitioned to torsemide 60 mg BID. DC weight: 226 lbs.   She returns for followup of CHF, chronic respiratory failure, and pulmonary hypertension.  She is still waiting to start selexipag.  She reports occasional dizziness, but SBP has been running 150s-160s at home and she is not orthostatic when checked today.  She remains limited.  She stopped once walking into the office today.  Able to walk around her house without dyspnea. Short of breath doing her laundary, doing chores like washing dishes. No chest pain.  Weight down 3 lbs.   6 minute walk (1/20): 98 m  Labs (11/19): digoxin 0.8 Labs (1/20): K 4.2, creatinine 1.36 => 1.33, digoxin 0.8  PMH: 1. Lymphedema: Left lower leg (chronic).  2. COPD 3. 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  - PFTs (2017) with FEV1 64%, FEV1/FVC ratio 106%, TLC 49%, DLCO 39% (restrictive).  - CT chest high resolution (10/19): Pulmonary parenchymal pattern of fibrosis.  4. Atrial fibrillation: Paroxysmal.  She is off amiodarone due to lung disease.   - 2/19 DCCV.  5. H/o DVT s/p IVC filter in 2004.  6. CAD: 4/19 coronary angiography with minimal coronary disease.  7. Bradycardia: [employer] PPM.  8. H/o traumatic SAH 2014.  9. CKD: Stage 3.  10. H/o CVA 11. OSA: CPAP 12. HTN 13. Type II diabetes 14. Pulmonary hypertension:  - PFTs 2017 with severe restriction.  - Echo (10/19): EF 60-65%, PASP 52 mmHg - RHC (4/19): Severe PAH with PVR 7.2 WU.  - RHC (10/19): mean RA 6, PA 56/17 mean 33, PCWP mean 12, CI 3.23, PVR 3.4 WU.  - V/Q scan (10/19): No chronic PE.  - Anti-SCL 70 and ANA negative.  ACE normal.  RF slightly elevated at 15.  FH: mother with CHF 27. 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   SH: Former smoker 1 pack/week x 2-3 years, quit 28 years ago. No ETOH or drug use. 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 Lives alone in [place]. Transportation is an issue.   ROS: Review of systems complete and found to be negative unless listed in HPI.    Current Outpatient Medications  Medication Sig Dispense Refill  . acetaminophen (TYLENOL) 325 MG tablet Take 325-650 mg by mouth every 6 (six) hours as needed for mild pain. Reported on 10/10/2015    . albuterol (PROVENTIL) (2.5 MG/3ML) 0.083% nebulizer solution Take 3 mLs (2.5 mg total) by nebulization every 6 (six) hours. 75 mL 12  . amLODipine (NORVASC) 10 MG tablet Take 1 tablet (10 mg total) by mouth daily. 30 tablet 6  . apixaban (ELIQUIS) 5 MG TABS tablet Take 1 tablet (5 mg total) by mouth 2 (two)  times daily. 60 tablet 11  . Blood Glucose Monitoring Suppl (ACCU-CHEK AVIVA PLUS) w/Device KIT USE AS DIRECTED 1 kit 0  . digoxin (LANOXIN) 0.125 MG tablet Take 1 tablet (0.125 mg total) by mouth daily. 30 tablet 3  . gabapentin (NEURONTIN) 100 MG capsule TAKE 2 CAPSULES BY MOUTH THREE TIMES DAILY 180 capsule 0  . glucose blood (TRUE METRIX BLOOD GLUCOSE TEST) test strip Check blood glucose twice daily Dx:E11.8, Z79.4 100 each 12  . glucose blood  test strip Use as instructed 100 each 12  . Lancets (ACCU-CHEK SOFT TOUCH) lancets Use as instructed 100 each 12  . macitentan (OPSUMIT) 10 MG tablet Take 1 tablet (10 mg total) by mouth daily. 30 tablet 3  . meclizine (ANTIVERT) 25 MG tablet Take 1 tablet (25 mg total) by mouth 3 (three) times daily as needed for dizziness. 30 tablet 0  . Menthol, Topical Analgesic, (BIOFREEZE EX) Apply 1 application topically as needed (for pain).     . metFORMIN (GLUCOPHAGE) 500 MG tablet Take 500 mg by mouth 2 (two) times daily with a meal.     . ondansetron (ZOFRAN) 8 MG tablet Take 1 tablet (8 mg total) by mouth every 8 (eight) hours as needed for nausea or vomiting. 30 tablet 2  . OXYGEN Inhale 4 L into the lungs continuous.    . potassium chloride SA (K-DUR,KLOR-CON) 20 MEQ tablet Take 1 tablet (20 mEq total) by mouth 2 (two) times daily. 180 tablet 3  . rosuvastatin (CRESTOR) 10 MG tablet Take 1 tablet (10 mg total) by mouth daily. 90 tablet 3  . sildenafil (REVATIO) 20 MG tablet Take 20 mg by mouth 3 (three) times daily.    [name] spironolactone (ALDACTONE) 25 MG tablet Take 1 tablet (25 mg total) by mouth at bedtime. 30 tablet 3  . torsemide (DEMADEX) 20 MG tablet Take 3 tablets (60 mg total) by mouth 2 (two) times daily for 30 days. 782 tablet 0  . [name] 9.56 OZ/3.0QM SOPN INJECT 0.75MG SUBCUTANEOUSLY ONCE A WEEK 4 pen 2  . Selexipag (UPTRAVI) 200 MCG TABS Take 1 tablet (200 mcg total) by mouth 2 (two) times daily. (Patient not taking: Reported on 07/22/2018) 60 tablet 3   No current facility-administered medications for this encounter.     Allergies  Allergen Reactions  . Aspirin Hives and Other (See Comments)    Pt states that she is unable to take because she had bleeding in her brain  . Other Other (See Comments)    Pt states that she is unable to take blood thinners because she had bleeding in her brain; Blood Thinners-per doctor at [facility]  . Penicillins Hives, Shortness Of Breath,  Swelling and Other (See Comments)    Has patient had a PCN reaction causing immediate rash, facial/tongue/throat swelling, SOB or lightheadedness with hypotension: Yes Has patient had a PCN reaction causing severe rash involving mucus membranes or skin necrosis: No Has patient had a PCN reaction that required hospitalization No Has patient had a PCN reaction occurring within the last 10 years: No If all of the above answers are "NO", then may proceed with Cephalosporin use.      Social History   Socioeconomic History  . Marital status: Divorced    Spouse name: Not on file  . Number of children: 4  . Years of education: 23  . Highest education level: High school graduate  Occupational History  . Occupation: Retired- factory work    Comment: exposed to  fabric dust  Social Needs  . Financial resource strain: Not hard at all  . Food insecurity:    Worry: Never true    Inability: Never true  . Transportation needs:    Medical: Yes    Non-medical: Yes  Tobacco Use  . Smoking status: Former Smoker    Packs/day: 0.30    Years: 2.00    Pack years: 0.60    Types: Cigarettes    Last attempt to quit: 06/23/1990    Years since quitting: 28.1  . Smokeless tobacco: Never Used  Substance and Sexual Activity  . Alcohol use: No  . Drug use: No  . Sexual activity: Not Currently  Lifestyle  . Physical activity:    Days per week: 0 days    Minutes per session: 0 min  . Stress: To some extent  Relationships  . Social connections:    Talks on phone: More than three times a week    Gets together: Once a week    Attends religious service: Never    Active member of club or organization: No    Attends meetings of clubs or organizations: Never    Relationship status: Divorced  . Intimate partner violence:    Fear of current or ex partner: Patient refused    Emotionally abused: Patient refused    Physically abused: Patient refused    Forced sexual activity: Patient refused  Other Topics  Concern  . Not on file  Social History Narrative   Lives in [place] with daughter. Has 4 children. No pets.      Work - Restaurant manager, fast food, retired      Diet - regular      Family History  Problem Relation Age of Onset  . Heart disease Mother   . Hypertension Mother   . COPD Mother        was a smoker  . Thyroid disease Mother   . Hypertension Father   . Hypertension Sister   . Thyroid disease Sister   . Hypertension Sister   . Thyroid disease Sister   . Breast cancer Maternal Aunt   . Kidney disease Neg Hx   . Bladder Cancer Neg Hx     Vitals:   08/20/18 1109 08/20/18 1149  BP: (!) 142/72 (!) 156/80  Pulse: 62   SpO2: 97%   Weight: 100.1 kg (220 lb 9.6 oz)    Wt Readings from Last 3 Encounters:  08/20/18 100.1 kg (220 lb 9.6 oz)  08/04/18 99.8 kg (220 lb)  07/29/18 99.8 kg (220 lb)    PHYSICAL EXAM: General: NAD Neck: No JVD, no thyromegaly or thyroid nodule.  Lungs: Clear to auscultation bilaterally with normal respiratory effort. CV: Nondisplaced PMI.  Heart regular S1/S2, no S3/S4, 2/6 SEM RUSB.  Left left 1+ edema to the knee on left (chronic), none on right.  No carotid bruit.  Normal pedal pulses.  Abdomen: Soft, nontender, no hepatosplenomegaly, no distention.  Skin: Intact without lesions or rashes.  Neurologic: Alert and oriented x 3.  Psych: Normal affect. Extremities: No clubbing or cyanosis.  HEENT: Normal.   ASSESSMENT & PLAN:  1. 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 Echo 10/19 showed EF 60-65% with moderate LVH, RV was difficult to see. During admission in 10/19, she required milrinone to support RV.  On exam today, she is not volume overloaded.  Chronic NYHA class IIIb symptoms, likely due primarily to lung disease and pulmonary hypertension.  - Continue torsemide 60 mg  bid, BMET today.  2. Pulmonary hypertension: She was followed in the past at [facility].  Based on 4/19 cath, she had severe PAH with PVR 7.2 WU. She has been  on sildenafil 20 mg tid x 4 years. Chronic hypoxemic respiratory failure, on home oxygen. She has a history of remote DVT (2004) but V/Q scan negative for evidence of chronic PE. High resolution CT chest in 10/19 showed pulmonary fibrosis, and last PFTs in 2017 showed severe restriction.  Serological workup for rheumatological diseases was unremarkable.  There has been concern for possible burnt-out sarcoidosis as cause of her interstitial fibrosis and hilar adenopathy.  She has family history of this. Amiodarone was stopped given lung parenchymal disease. 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 Repeat RHC in 10/19 showed moderate pulmonary hypertension, PVR 3.4 WU which is significantly lower than on the 4/19 cath. She remains quite limited.  - Continue sildenafil and Opsumit.   - She has been approved for selexipag, awaiting its arrival.  - Continue digoxin for RV support, check digoxin level today.  - Continue CPAP and home oxygen.  - Followup with pulmonary for ILD.  3. CKD stage 3: BMET today.  4. Atrial fibrillation: Paroxysmal.  Off amiodarone with parenchymal lung disease.  - Continue apixaban 5 mg bid.  5. OSA: Continue CPAP. 6. HTN: BP continually runs significantly high.  She is not orthostatic.  - Increase amlodipine to 10 mg daily.    Followup in 6 wks, will need 6 minute walk.   [name] 08/22/2018

## 2018-08-23 NOTE — Progress Notes (Signed)
Patient Care Team: [name], MD as PCP - General (Family Medicine) Wellington Hampshire, MD as PCP - Cardiology (Cardiology) Thom Chimes, MD as Referring Physician (Allergy) Juanito Doom, MD as Referring Physician (Internal Medicine)   HPI  Debra Saunders is a 73 y.o. female Seen in followup for [place] pacemaker implanted 7/15 for bradycardia and chronotropic incompetence.  She has a history of atrial fibrillation for which she had previously been treated with amiodarone.  She has developed pulmonary fibrosis and the amiodarone has been discontinued as a potential culprit.  She is followed closely by the heart failure clinic Dr. DM.   She was last seen by me December 2017.   Date Cr K Hgb  10/19 1.16 4.0 9.3  11/19 1.21 4.1   11/19 1.29 3.8   1/20 1.36 4.2 10.2  2/20 1.7 4.3 9.9  2/20 1.32 4.3    The patient was seen in the ED on 06/25/2018 for chest pressure that was present for 3-4 days prior to their visit. EKG showed av dual paced rhythm, LAD, qtc 424, narrow q waves v1, no st elevation. Patient's troponin was negative. Chest x-ray without any pneumonia or pneumothorax.  Patient was given both lidocaine as well as nitroglycerin without any significant change in the patient's symptoms.    She is accompanied by her daughter. She has been doing well overall. Adds her SOB is worse at times and better at times, especially since being put on oxygen.  No peripheral edema on her right leg; chronic lower extremity swelling on the left.  Denies chest pain.  No syncope palpitations or defibrillator discharges.  She is scheduled for a colonoscopy 09/10/2018 for evaluation of anemia   Remains on anticoagulation      Past Medical History:  Diagnosis Date   Arthritis    Atrial fibrillation, persistent    a. s/p TEE-guided DCCV on 08/20/2017   Cardiorenal syndrome    a. 09/2017 Creat rose to 2.7 w/ diuresis-->HD x 2.   Chest pain    a. 01/2012  Cath: nonobs dzs;  c. 04/2014 MV: No ischemia.   Chronic back pain    Chronic combined systolic (congestive) and diastolic (congestive) heart failure ([place])    a. Dx 2005 @ [facility];  b. 02/2014 Echo: Ef 55-60%, no rwma, mild MR, PASP 63mHg. c. 07/2017: EF 30-35%, diffuse HK, trivial AI, severe MR, moderate TR; d. 09/2017 Echo: EF 40-45%, ant, antsep, ap, basal HK; e. 02/2018 Echo: EF 55-60%, Gr1 DD. Ao sclerosis w/o stenosis. Mild to mod MR. Mildly dil LA. Mildly reduced RV fxn. Mild TR. PASP 35-479mg.   Chronic respiratory failure (HCC)    a. on home O2   COPD (chronic obstructive pulmonary disease) ([place]   a. Home O2 - 3lpm   Gastritis    History of intracranial hemorrhage    a. 6/14 described by neurosurgery as "small frontal posttraumatic SAH " - pt was on xarelto @ the time.   Hyperlipidemia    Hypertension    Lipoma of back    Lymphedema    a. Chronic LLE edema.   NICM (nonischemic cardiomyopathy) ([facility]   a. 09/2017 Echo: EF 40-45%; b. 09/2017 Cath: minor, insignificant CAD; c. 02/2018 Echo: EF 55-60%, Gr1 DD.   Obesity    PAH (pulmonary artery hypertension) ([facility]   a. 09/2017 Echo: PASP 6585m; b. 09/2017 RHC: RA 23, RV 84/13, PA 84/29, PCWP 20, CO 3.89, CI 1.89.  LVEDP 18.   Sepsis with metabolic encephalopathy (HCC)    Sleep apnea    Streptococcal bacteremia    Stroke (HCC)    Symptomatic bradycardia    a. s/p BSX PPM.   Thyroid nodule    Type II diabetes mellitus ([place])    Urinary incontinence     Past Surgical History:  Procedure Laterality Date   [place]  2013   @ [place]: No obstructive CAD: Only 20% ostial left circumflex.   CARDIOVERSION N/A 10/12/2017   Procedure: CARDIOVERSION;  Surgeon: Minna Merritts, MD;  Location: [facility] ORS;  Service: Cardiovascular;  Laterality: N/A;   CHOLECYSTECTOMY     CYST REMOVAL TRUNK  2002   BACK   DIALYSIS/PERMA CATHETER INSERTION N/A 10/06/2017   Procedure:  DIALYSIS/PERMA CATHETER INSERTION;  Surgeon: Katha Cabal, MD;  Location: [place] [facility];  Service: Cardiovascular;  Laterality: N/A;   INSERT / REPLACE / REMOVE PACEMAKER     lipoma removal  2014   back   PACEMAKER INSERTION  2015   [employer] dual chamber pacemaker implanted by Dr Caryl Comes for symptomatic bradycardia   PERMANENT PACEMAKER INSERTION N/A 01/09/2014   Procedure: PERMANENT PACEMAKER INSERTION;  Surgeon: Deboraha Sprang, MD;  Location: [facility] CATH LAB;  Service: Cardiovascular;  Laterality: N/A;   RIGHT HEART CATH N/A 04/19/2018   Procedure: RIGHT HEART CATH;  Surgeon: Larey Dresser, MD;  Location: [place] CV LAB;  Service: Cardiovascular;  Laterality: N/A;   RIGHT/LEFT HEART CATH AND CORONARY ANGIOGRAPHY N/A 10/19/2017   Procedure: RIGHT/LEFT HEART CATH AND CORONARY ANGIOGRAPHY;  Surgeon: Wellington Hampshire, MD;  Location: [facility] CV LAB;  Service: Cardiovascular;  Laterality: N/A;   TEE WITHOUT CARDIOVERSION N/A 08/20/2017   Procedure: TRANSESOPHAGEAL ECHOCARDIOGRAM (TEE) & Direct current cardioversion;  Surgeon: Minna Merritts, MD;  Location: [facility] ORS;  Service: Cardiovascular;  Laterality: N/A;   TRIGGER FINGER RELEASE     VAGINAL HYSTERECTOMY      Current Outpatient Medications  Medication Sig Dispense Refill   acetaminophen (TYLENOL) 325 MG tablet Take 325-650 mg by mouth every 6 (six) hours as needed for mild pain. Reported on 10/10/2015     albuterol (PROVENTIL) (2.5 MG/3ML) 0.083% nebulizer solution Take 3 mLs (2.5 mg total) by nebulization every 6 (six) hours. 75 mL 12   amLODipine (NORVASC) 10 MG tablet Take 1 tablet (10 mg total) by mouth daily. 30 tablet 6   apixaban (ELIQUIS) 5 MG TABS tablet Take 1 tablet (5 mg total) by mouth 2 (two) times daily. 60 tablet 11   Blood Glucose Monitoring Suppl (ACCU-CHEK AVIVA PLUS) w/Device KIT USE AS DIRECTED 1 kit 0   digoxin (LANOXIN) 0.125 MG tablet Take 1 tablet (0.125 mg total) by mouth  daily. 30 tablet 3   gabapentin (NEURONTIN) 100 MG capsule TAKE 2 CAPSULES BY MOUTH THREE TIMES DAILY 180 capsule 0   glucose blood (TRUE METRIX BLOOD GLUCOSE TEST) test strip Check blood glucose twice daily Dx:E11.8, Z79.4 100 each 12   glucose blood test strip Use as instructed 100 each 12   Lancets (ACCU-CHEK SOFT TOUCH) lancets Use as instructed 100 each 12   macitentan (OPSUMIT) 10 MG tablet Take 1 tablet (10 mg total) by mouth daily. 30 tablet 3   meclizine (ANTIVERT) 25 MG tablet Take 1 tablet (25 mg total) by mouth 3 (three) times daily as needed for dizziness. 30 tablet 0   Menthol, Topical Analgesic, (BIOFREEZE EX) Apply  1 application topically as needed (for pain).      metFORMIN (GLUCOPHAGE) 500 MG tablet Take 500 mg by mouth 2 (two) times daily with a meal.      ondansetron (ZOFRAN) 8 MG tablet Take 1 tablet (8 mg total) by mouth every 8 (eight) hours as needed for nausea or vomiting. 30 tablet 2   OXYGEN Inhale 4 L into the lungs continuous.     potassium chloride SA (K-DUR,KLOR-CON) 20 MEQ tablet Take 1 tablet (20 mEq total) by mouth 2 (two) times daily. 180 tablet 3   rosuvastatin (CRESTOR) 10 MG tablet Take 1 tablet (10 mg total) by mouth daily. 90 tablet 3   sildenafil (REVATIO) 20 MG tablet Take 20 mg by mouth 3 (three) times daily.     spironolactone (ALDACTONE) 25 MG tablet Take 1 tablet (25 mg total) by mouth at bedtime. 30 tablet 3   torsemide (DEMADEX) 20 MG tablet Take 3 tablets (60 mg total) by mouth 2 (two) times daily for 30 days. 654 tablet 0   [name] 6.50 PT/4.6FK SOPN INJECT 0.75MG SUBCUTANEOUSLY ONCE A WEEK 4 pen 2   Selexipag (UPTRAVI) 200 MCG TABS Take 1 tablet (200 mcg total) by mouth 2 (two) times daily. (Patient not taking: Reported on 07/22/2018) 60 tablet 3   No current facility-administered medications for this visit.     Allergies  Allergen Reactions   Aspirin Hives and Other (See Comments)    Pt states that she is unable to take  because she had bleeding in her brain   Other Other (See Comments)    Pt states that she is unable to take blood thinners because she had bleeding in her brain; Blood Thinners-per doctor at [facility], Shortness Of Breath, Swelling and Other (See Comments)    Has patient had a PCN reaction causing immediate rash, facial/tongue/throat swelling, SOB or lightheadedness with hypotension: Yes Has patient had a PCN reaction causing severe rash involving mucus membranes or skin necrosis: No Has patient had a PCN reaction that required hospitalization No Has patient had a PCN reaction occurring within the last 10 years: No If all of the above answers are "NO", then may proceed with Cephalosporin use.    Telephone call so you still of Systems negative except from HPI and PMH  Physical Exam BP 121/63 (BP Location: Left Arm, Patient Position: Sitting, Cuff Size: Large)    Pulse 65    Ht 5' (1.524 m)    Wt 217 lb 4 oz (98.5 kg)    BMI 42.43 kg/m  Well developed and Morbidly obese in no acute distress HENT normal Neck supple   Clear Device pocket well healed; without hematoma or erythema.  There is no tethering  Regular rate and rhythm, no gallop 2/6 murmur Abd-soft with active BS No Clubbing cyanosis aysmmetric edema Skin-warm and dry A & Oriented  Grossly normal sensory and motor function  ECG  Atrial pacing ,     Assessment and  Plan  Chronotropic incompetence  Sinus bradycardia  Atrial tachycardia non sustained with rates 200 msec (ie nonsustained AFib)  Dyspnea on exertion  Secondary pulmonary hypertension  Pacemaker       HFpEF  Hypertension  Hx of intracranial hemorrhage  Not on anticoagulation   Functionally stable  ECG was repeated which clarified atrial pacing capture.    Far field RW sensing so ASensitivity decreased  Afib detected as SVT    FreqPAC  >> DDD  Inactivated Rate  Response and with Extended AV delay pseudofusion was present     More than 50% of 40 min was spent in counseling related to the above    I, Margit Banda am acting as a scribe for Virl Axe, M.D. I have reviewed the above documentation for accuracy and completeness, and I agree with the above.    Signed, Virl Axe, MD 08/24/18 [place], [place]

## 2018-08-23 NOTE — Telephone Encounter (Signed)
Pt states she would like to wait on the referral she has a lot of appt this month.  Gae Bon, CMA

## 2018-08-24 ENCOUNTER — Ambulatory Visit: Payer: Medicare HMO | Admitting: Internal Medicine

## 2018-08-24 VITALS — BP 121/63 | HR 65 | Ht 60.0 in | Wt 217.2 lb

## 2018-08-24 DIAGNOSIS — I495 Sick sinus syndrome: Secondary | ICD-10-CM

## 2018-08-24 DIAGNOSIS — R001 Bradycardia, unspecified: Secondary | ICD-10-CM | POA: Diagnosis not present

## 2018-08-24 DIAGNOSIS — I4819 Other persistent atrial fibrillation: Secondary | ICD-10-CM | POA: Diagnosis not present

## 2018-08-24 DIAGNOSIS — Z950 Presence of cardiac pacemaker: Secondary | ICD-10-CM | POA: Diagnosis not present

## 2018-08-24 NOTE — Patient Instructions (Addendum)
Medication Instructions:  - Your physician recommends that you continue on your current medications as directed. Please refer to the Current Medication list given to you today.  If you need a refill on your cardiac medications before your next appointment, please call your pharmacy.   Lab work: - none ordered  If you have labs (blood work) drawn today and your tests are completely normal, you will receive your results only by: [name] MyChart Message (if you have MyChart) OR . A paper copy in the mail If you have any lab test that is abnormal or we need to change your treatment, we will call you to review the results.  Testing/Procedures: - none ordered  Follow-Up: At [facility], you and your health needs are our priority.  As part of our continuing mission to provide you with exceptional heart care, we have created designated Provider Care Teams.  These Care Teams include your primary Cardiologist (physician) and Advanced Practice Providers (APPs -  Physician Assistants and Nurse Practitioners) who all work together to provide you with the care you need, when you need it. . You will need a follow up appointment in 1 year with Dr. Caryl Comes.  Please call our office 2 months in advance to schedule this appointment.    Remote monitoring is used to monitor your Pacemaker of ICD from home. This monitoring reduces the number of office visits required to check your device to one time per year. It allows [place] to keep an eye on the functioning of your device to ensure it is working properly. You are scheduled for a device check from home on 08/30/2018. You may send your transmission at any time that day. If you have a wireless device, the transmission will be sent automatically. After your physician reviews your transmission, you will receive a postcard with your next transmission date.   Any Other Special Instructions Will Be Listed Below (If Applicable). - N/A

## 2018-08-25 NOTE — Telephone Encounter (Signed)
Sent to PCP as an FYI  

## 2018-08-26 ENCOUNTER — Telehealth: Payer: Self-pay | Admitting: Cardiovascular Disease

## 2018-08-26 ENCOUNTER — Other Ambulatory Visit (HOSPITAL_COMMUNITY): Payer: Self-pay | Admitting: Cardiology

## 2018-08-26 ENCOUNTER — Other Ambulatory Visit (HOSPITAL_COMMUNITY): Payer: Self-pay

## 2018-08-26 ENCOUNTER — Encounter (HOSPITAL_COMMUNITY): Payer: Self-pay

## 2018-08-26 DIAGNOSIS — J9611 Chronic respiratory failure with hypoxia: Secondary | ICD-10-CM | POA: Diagnosis not present

## 2018-08-26 LAB — CUP PACEART INCLINIC DEVICE CHECK
Brady Statistic RA Percent Paced: 42 %
Brady Statistic RV Percent Paced: 24 %
Date Time Interrogation Session: 20200303050000
Implantable Lead Implant Date: 20150720
Implantable Lead Implant Date: 20150720
Implantable Lead Location: 753859
Implantable Lead Location: 753860
Implantable Lead Model: 5076
Implantable Lead Model: 5076
Implantable Pulse Generator Implant Date: 20150720
Lead Channel Impedance Value: 395 Ohm
Lead Channel Impedance Value: 540 Ohm
Lead Channel Pacing Threshold Amplitude: 0.5 V
Lead Channel Pacing Threshold Amplitude: 0.7 V
Lead Channel Pacing Threshold Pulse Width: 0.4 ms
Lead Channel Pacing Threshold Pulse Width: 0.4 ms
Lead Channel Sensing Intrinsic Amplitude: 1.7 mV
Lead Channel Sensing Intrinsic Amplitude: 13.5 mV
Lead Channel Setting Pacing Amplitude: 2 V
Lead Channel Setting Pacing Amplitude: 2.4 V
Lead Channel Setting Pacing Pulse Width: 0.4 ms
Lead Channel Setting Sensing Sensitivity: 2.5 mV
Pulse Gen Serial Number: 390330

## 2018-08-26 NOTE — Telephone Encounter (Signed)
Patient calling the office for samples of medication:   1.  What medication and dosage are you requesting samples for?  Eliquis 5 mg po BID   2.  Are you currently out of this medication?  2 days left Financial forms pending approval .

## 2018-08-26 NOTE — Progress Notes (Signed)
Bp: 126/72, pulse: 60, resp: 18, spo2: 96 todays weight: 218 lbs  Today Karrigan states been more short of breath this week.  Lungs are clear, denies chest pain, headaches, she has some dizziness.  Swelling is about her normal, abdomen does not feel tight.  She has been getting around the house normally and doing her normal things.  She is wearing her oxygen 24 hours a day and cpap at night.  Meds verified, she is in process of getting approved for eliquis, they have received the paper work and waiting for approval.  She has 2 days of eliquis left.  [facility] for samples, not returned call yet.  [facility] has 2 boxes for her but she has no way of getting them.  Called in sildinafil, opsumit, torsemide and spirolactone to pharmacies.  Still waiting for approval for her new medication, uptravi.  Will continue to visit weekly for med compliance, diet and heart failure.   [place] [phone]

## 2018-08-26 NOTE — Telephone Encounter (Signed)
Eliquis 5 mg tablet 1 box placed upfront for pick up. XBL:[id number] ZRA:QTMA 2022

## 2018-08-29 NOTE — Telephone Encounter (Signed)
Noted. I will place an order and she can call to schedule this when it is best for her. Please provide her the number to [facility]. Thanks.

## 2018-08-29 NOTE — Addendum Note (Signed)
Addended by: [name] on: 08/29/2018 11:23 AM   Modules accepted: Orders

## 2018-08-30 ENCOUNTER — Other Ambulatory Visit (HOSPITAL_COMMUNITY): Payer: Self-pay

## 2018-08-30 ENCOUNTER — Telehealth: Payer: Self-pay | Admitting: Cardiovascular Disease

## 2018-08-30 ENCOUNTER — Ambulatory Visit (INDEPENDENT_AMBULATORY_CARE_PROVIDER_SITE_OTHER): Payer: Medicare HMO | Admitting: *Deleted

## 2018-08-30 ENCOUNTER — Telehealth (HOSPITAL_COMMUNITY): Payer: Self-pay

## 2018-08-30 DIAGNOSIS — I495 Sick sinus syndrome: Secondary | ICD-10-CM | POA: Diagnosis not present

## 2018-08-30 DIAGNOSIS — I509 Heart failure, unspecified: Secondary | ICD-10-CM | POA: Diagnosis not present

## 2018-08-30 DIAGNOSIS — E042 Nontoxic multinodular goiter: Secondary | ICD-10-CM | POA: Diagnosis not present

## 2018-08-30 DIAGNOSIS — R001 Bradycardia, unspecified: Secondary | ICD-10-CM

## 2018-08-30 MED ORDER — APIXABAN 5 MG PO TABS: 5.0000 mg | ORAL_TABLET | Freq: Two times a day (BID) | ORAL | 11 refills | Status: DC

## 2018-08-30 NOTE — Telephone Encounter (Signed)
Plan of treatment for continuing Nursing services faxed signed by DM, confirmation received

## 2018-08-30 NOTE — Telephone Encounter (Signed)
° °  [facility] Pre-operative Risk Assessment    Request for surgical clearance:  1. What type of surgery is being performed? Biopsy guided FNA of left thyroid    2. When is this surgery scheduled? TBD   3. What type of clearance is required (medical clearance vs. Pharmacy clearance to hold med vs. Both)? pharmacy   4. Are there any medications that need to be held prior to surgery and how long? Eliquis - how long to hold prior and when to start back   5. Practice name and name of physician performing surgery? [facility] calling, radiologist at hospital will be doing procedure   6. What is your office phone number  [phone]   7.   What is your office fax number [phone]  8.   Anesthesia type (None, local, MAC, general) ? Local    Ace Gins 08/30/2018, 3:34 PM  _________________________________________________________________   (provider comments below)

## 2018-08-30 NOTE — Telephone Encounter (Signed)
Called and spoke with patient. Pt advised and voiced understanding. Gave pt the phone number to call [place] [phone]

## 2018-08-30 NOTE — Telephone Encounter (Signed)
Dr. [name] pt states she is doing well has even lost a few pounds but no change in her breathing status and no chest pain-  She is having trouble getting Eliquis.  But I will clear for thyroid biopsy unless you recommend otherwise.  Thanks.

## 2018-08-30 NOTE — Telephone Encounter (Signed)
Pharm, please address Eliquis and pt tells me she was told she can no longer have help in receiving.  She has a week worth's of samples.  Thanks.

## 2018-08-30 NOTE — Telephone Encounter (Signed)
Patient with diagnosis of Afib on Eliquis for anticoagulation.    Procedure: thyroid biopsy Date of procedure: TBD  CHADS2-VASc score of  8 (CHF, HTN, AGE, DM2, stroke/tia x 2, CAD, AGE, female)  CrCl 72ml/min  Per office protocol, patient can hold eliquis for 24 hours prior to procedure.  With history of stroke would recommend resume Eliquis as soon as safe post procedure.

## 2018-08-31 ENCOUNTER — Other Ambulatory Visit: Payer: Self-pay | Admitting: Internal Medicine

## 2018-08-31 LAB — CUP PACEART REMOTE DEVICE CHECK
Battery Remaining Longevity: 96 mo
Battery Remaining Percentage: 100 %
Brady Statistic RA Percent Paced: 71 %
Brady Statistic RV Percent Paced: 57 %
Date Time Interrogation Session: 20200309072200
Implantable Lead Implant Date: 20150720
Implantable Lead Implant Date: 20150720
Implantable Lead Location: 753859
Implantable Lead Location: 753860
Implantable Lead Model: 5076
Implantable Lead Model: 5076
Implantable Pulse Generator Implant Date: 20150720
Lead Channel Impedance Value: 393 Ohm
Lead Channel Impedance Value: 519 Ohm
Lead Channel Pacing Threshold Amplitude: 0.7 V
Lead Channel Pacing Threshold Pulse Width: 0.4 ms
Lead Channel Setting Pacing Amplitude: 2 V
Lead Channel Setting Pacing Amplitude: 2.4 V
Lead Channel Setting Pacing Pulse Width: 0.4 ms
Lead Channel Setting Sensing Sensitivity: 2.5 mV
Pulse Gen Serial Number: 390330

## 2018-09-01 ENCOUNTER — Other Ambulatory Visit: Payer: Self-pay | Admitting: Unknown Physician Specialty

## 2018-09-01 ENCOUNTER — Telehealth (HOSPITAL_COMMUNITY): Payer: Self-pay | Admitting: Licensed Clinical Social Worker

## 2018-09-01 DIAGNOSIS — E041 Nontoxic single thyroid nodule: Secondary | ICD-10-CM

## 2018-09-01 NOTE — Telephone Encounter (Signed)
CSW received call from pt requesting assistance with her Eliquis copay- pt has been temporarily denied from the [place] until she has spent $312.74 on all of her medications for the year.  Pt copay for Eliquis at this time is $74.18.  Discussed alternative medication options with APP- Louanna Raw would be cheaper at $24.80 but pt had a stroke while taking this medication previously and would not want to restart it.  Only other option is coumadin which would require that she gets INR checks.  Pt updated and states she will stay on Eliquis to avoid a med change- will plan to follow up with CSW once she has reached spending requirment.  CSW will continue to follow and assist as needed  Jorge Ny, LCSW Clinical Social Worker [facility] [phone]

## 2018-09-03 ENCOUNTER — Encounter (HOSPITAL_COMMUNITY): Payer: Self-pay

## 2018-09-03 ENCOUNTER — Other Ambulatory Visit (HOSPITAL_COMMUNITY): Payer: Self-pay

## 2018-09-03 ENCOUNTER — Other Ambulatory Visit: Payer: Self-pay | Admitting: Family Medicine

## 2018-09-03 NOTE — Progress Notes (Signed)
Bp: 110/70, pulse: 68, resp: 18, spo2: 95 last weight:  218 Todays: 217 lbs  Today Babygirl states doing ok, she states short of breath past 2 days but better today.  She states her weight still going down, her BP is getting lower.  She denies chest pain, dizziness or shortness of breath.  Picked her up 2 boxes samples of eliquis, she states she is not going to afford the eliquis since she was not approved for help.  Will look to see if she is eligible for any other help.  She has 2 weeks worth right now.  She has all her other med, meds verified.  Her swelling in extremities are down.  She denies abdominal tightness.  She states her appetite been ok, she tries to watch her sodium but she gets meals on wheels which are high in sodium.  She is scheduled for biopsy and a colonoscopy next week.  She gets around with walker, she does her own house cleaning.  She states she thinks she is doing pretty good.  Will continue to visit weekly for heart failure.   [place] [phone]

## 2018-09-06 NOTE — Progress Notes (Signed)
Remote pacemaker transmission.   

## 2018-09-07 ENCOUNTER — Other Ambulatory Visit: Payer: Self-pay | Admitting: Family Medicine

## 2018-09-08 ENCOUNTER — Other Ambulatory Visit: Payer: Self-pay

## 2018-09-08 ENCOUNTER — Ambulatory Visit
Admission: RE | Admit: 2018-09-08 | Discharge: 2018-09-08 | Disposition: A | Discharge status: Home / Self Care | Payer: Medicare HMO | Source: Ambulatory Visit | Attending: Unknown Physician Specialty | Admitting: Unknown Physician Specialty

## 2018-09-08 DIAGNOSIS — E042 Nontoxic multinodular goiter: Secondary | ICD-10-CM | POA: Insufficient documentation

## 2018-09-08 DIAGNOSIS — E041 Nontoxic single thyroid nodule: Secondary | ICD-10-CM

## 2018-09-08 DIAGNOSIS — D34 Benign neoplasm of thyroid gland: Secondary | ICD-10-CM | POA: Diagnosis not present

## 2018-09-08 NOTE — Procedures (Signed)
Interventional Radiology Procedure:   Indications: Thyroid nodules  Procedure: US guided FNA of left thyroid nodules  Findings: 5 FNAs of left superior nodule; 5 FNAs of left mid nodule  Complications: None     EBL: Minimal, less than 5 ml  Plan: Discharge to home.     Lelia Jons R. Anselm Pancoast, MD  Pager: [phone]

## 2018-09-09 ENCOUNTER — Other Ambulatory Visit: Payer: Self-pay

## 2018-09-09 ENCOUNTER — Telehealth (HOSPITAL_COMMUNITY): Payer: Self-pay | Admitting: Licensed Clinical Social Worker

## 2018-09-09 ENCOUNTER — Other Ambulatory Visit (HOSPITAL_COMMUNITY): Payer: Self-pay

## 2018-09-09 ENCOUNTER — Telehealth: Payer: Self-pay | Admitting: Family Medicine

## 2018-09-09 LAB — HM COLONOSCOPY

## 2018-09-09 MED ORDER — GABAPENTIN 100 MG PO CAPS: 200.0000 mg | ORAL_CAPSULE | Freq: Three times a day (TID) | ORAL | 0 refills | Status: DC

## 2018-09-09 NOTE — Telephone Encounter (Signed)
CSW received call from [facility], CP stating patient shared she is unable to afford her Eliquis. She was recently informed that her patient assistance will not kick in until she meets the deductible of $312.00. Patient unable to afford the monthly cost of $74.00. CSW will assist with assistance for patient to obtain medication when ready for refill as she currently has a medication for a week or so. CP will reach out to CSW when needed. [name] Sarna, [place], [place]

## 2018-09-09 NOTE — Telephone Encounter (Signed)
Rx has been refilled.  

## 2018-09-09 NOTE — Progress Notes (Signed)
Today visit with Debra Saunders she states been feeling good.  She states her weight is going down.  She states occasional dizzy spells but not as bad as they were.  She has all her meds, meds were verified and placed in med box.  Swelling is down. She had a biopsy of thyroid yesterday, doing well with it, little sore.  She is going for a colonoscopy tomorrow, she is doing well with her prep today.  Advised her to check her sugar often today.  Called in her gabapentin and she will need to pick her eliquis up next week that was called in by HF clinic. [name] has been approved and pharmacy should be out soon to start her on it.  She denies chest pain and lungs are clear.  Will continue to visit for heart failure and med compliance.    [place] visit [phone]

## 2018-09-09 NOTE — Telephone Encounter (Signed)
Copied from [place] [id number]. Topic: Quick Communication - Rx Refill/Question >> Sep 09, 2018  3:34 PM Leward Quan A wrote: Medication: gabapentin (NEURONTIN) 100 MG capsule   Has the patient contacted their pharmacy? Yes.   (Agent: If no, request that the patient contact the pharmacy for the refill.) (Agent: If yes, when and what did the pharmacy advise?)  Preferred Pharmacy (with phone number or street name): [place] (N), [place] [phone] (Phone) [phone] (Fax)    Agent: Please be advised that RX refills may take up to 3 business days. We ask that you follow-up with your pharmacy.

## 2018-09-10 ENCOUNTER — Ambulatory Visit: Payer: Medicare HMO | Admitting: Anesthesiology

## 2018-09-10 ENCOUNTER — Encounter: Payer: Self-pay | Admitting: *Deleted

## 2018-09-10 ENCOUNTER — Ambulatory Visit
Admission: RE | Admit: 2018-09-10 | Discharge: 2018-09-10 | Disposition: A | Discharge status: Home / Self Care | Payer: Medicare HMO | Attending: Unknown Physician Specialty | Admitting: Unknown Physician Specialty

## 2018-09-10 ENCOUNTER — Encounter
Admission: RE | Disposition: A | Discharge status: Home / Self Care | Payer: Self-pay | Source: Home / Self Care | Attending: Unknown Physician Specialty

## 2018-09-10 ENCOUNTER — Other Ambulatory Visit: Payer: Self-pay

## 2018-09-10 DIAGNOSIS — D126 Benign neoplasm of colon, unspecified: Secondary | ICD-10-CM | POA: Diagnosis not present

## 2018-09-10 DIAGNOSIS — Z8673 Personal history of transient ischemic attack (TIA), and cerebral infarction without residual deficits: Secondary | ICD-10-CM | POA: Insufficient documentation

## 2018-09-10 DIAGNOSIS — D125 Benign neoplasm of sigmoid colon: Secondary | ICD-10-CM | POA: Diagnosis not present

## 2018-09-10 DIAGNOSIS — Z87891 Personal history of nicotine dependence: Secondary | ICD-10-CM | POA: Diagnosis not present

## 2018-09-10 DIAGNOSIS — I5043 Acute on chronic combined systolic (congestive) and diastolic (congestive) heart failure: Secondary | ICD-10-CM | POA: Diagnosis not present

## 2018-09-10 DIAGNOSIS — I4819 Other persistent atrial fibrillation: Secondary | ICD-10-CM | POA: Insufficient documentation

## 2018-09-10 DIAGNOSIS — Z86010 Personal history of colonic polyps: Secondary | ICD-10-CM | POA: Diagnosis not present

## 2018-09-10 DIAGNOSIS — E669 Obesity, unspecified: Secondary | ICD-10-CM | POA: Insufficient documentation

## 2018-09-10 DIAGNOSIS — J449 Chronic obstructive pulmonary disease, unspecified: Secondary | ICD-10-CM | POA: Diagnosis not present

## 2018-09-10 DIAGNOSIS — Z7901 Long term (current) use of anticoagulants: Secondary | ICD-10-CM | POA: Diagnosis not present

## 2018-09-10 DIAGNOSIS — Z79899 Other long term (current) drug therapy: Secondary | ICD-10-CM | POA: Insufficient documentation

## 2018-09-10 DIAGNOSIS — Z9981 Dependence on supplemental oxygen: Secondary | ICD-10-CM | POA: Insufficient documentation

## 2018-09-10 DIAGNOSIS — E119 Type 2 diabetes mellitus without complications: Secondary | ICD-10-CM | POA: Insufficient documentation

## 2018-09-10 DIAGNOSIS — K5790 Diverticulosis of intestine, part unspecified, without perforation or abscess without bleeding: Secondary | ICD-10-CM | POA: Diagnosis not present

## 2018-09-10 DIAGNOSIS — N189 Chronic kidney disease, unspecified: Secondary | ICD-10-CM | POA: Diagnosis not present

## 2018-09-10 DIAGNOSIS — E785 Hyperlipidemia, unspecified: Secondary | ICD-10-CM | POA: Diagnosis not present

## 2018-09-10 DIAGNOSIS — K5730 Diverticulosis of large intestine without perforation or abscess without bleeding: Secondary | ICD-10-CM | POA: Insufficient documentation

## 2018-09-10 DIAGNOSIS — D123 Benign neoplasm of transverse colon: Secondary | ICD-10-CM | POA: Insufficient documentation

## 2018-09-10 DIAGNOSIS — Z6841 Body Mass Index (BMI) 40.0 and over, adult: Secondary | ICD-10-CM | POA: Insufficient documentation

## 2018-09-10 DIAGNOSIS — I5042 Chronic combined systolic (congestive) and diastolic (congestive) heart failure: Secondary | ICD-10-CM | POA: Diagnosis not present

## 2018-09-10 DIAGNOSIS — I130 Hypertensive heart and chronic kidney disease with heart failure and stage 1 through stage 4 chronic kidney disease, or unspecified chronic kidney disease: Secondary | ICD-10-CM | POA: Insufficient documentation

## 2018-09-10 DIAGNOSIS — K648 Other hemorrhoids: Secondary | ICD-10-CM | POA: Diagnosis not present

## 2018-09-10 DIAGNOSIS — Z7984 Long term (current) use of oral hypoglycemic drugs: Secondary | ICD-10-CM | POA: Diagnosis not present

## 2018-09-10 DIAGNOSIS — M1990 Unspecified osteoarthritis, unspecified site: Secondary | ICD-10-CM | POA: Insufficient documentation

## 2018-09-10 DIAGNOSIS — D649 Anemia, unspecified: Secondary | ICD-10-CM | POA: Diagnosis not present

## 2018-09-10 DIAGNOSIS — K635 Polyp of colon: Secondary | ICD-10-CM | POA: Diagnosis not present

## 2018-09-10 DIAGNOSIS — E039 Hypothyroidism, unspecified: Secondary | ICD-10-CM | POA: Diagnosis not present

## 2018-09-10 DIAGNOSIS — R195 Other fecal abnormalities: Secondary | ICD-10-CM | POA: Diagnosis not present

## 2018-09-10 DIAGNOSIS — G4730 Sleep apnea, unspecified: Secondary | ICD-10-CM | POA: Insufficient documentation

## 2018-09-10 DIAGNOSIS — G8929 Other chronic pain: Secondary | ICD-10-CM | POA: Insufficient documentation

## 2018-09-10 DIAGNOSIS — I110 Hypertensive heart disease with heart failure: Secondary | ICD-10-CM | POA: Diagnosis not present

## 2018-09-10 DIAGNOSIS — Z950 Presence of cardiac pacemaker: Secondary | ICD-10-CM | POA: Insufficient documentation

## 2018-09-10 DIAGNOSIS — I2721 Secondary pulmonary arterial hypertension: Secondary | ICD-10-CM | POA: Diagnosis not present

## 2018-09-10 HISTORY — PX: COLONOSCOPY WITH PROPOFOL: SHX5780

## 2018-09-10 LAB — CYTOLOGY - NON PAP

## 2018-09-10 LAB — GLUCOSE, CAPILLARY: Glucose-Capillary: 151 mg/dL — ABNORMAL HIGH (ref 70–99)

## 2018-09-10 SURGERY — COLONOSCOPY WITH PROPOFOL
Anesthesia: General

## 2018-09-10 MED ORDER — SODIUM CHLORIDE 0.9 % IV SOLN: INTRAVENOUS | Status: DC

## 2018-09-10 MED ORDER — LIDOCAINE 2% (20 MG/ML) 5 ML SYRINGE
INTRAMUSCULAR | Status: DC | PRN
  Administered 2018-09-10: 25 mg via INTRAVENOUS

## 2018-09-10 MED ORDER — PROPOFOL 500 MG/50ML IV EMUL
INTRAVENOUS | Status: DC | PRN
  Administered 2018-09-10: 120 ug/kg/min via INTRAVENOUS

## 2018-09-10 MED ORDER — PROPOFOL 10 MG/ML IV BOLUS
INTRAVENOUS | Status: DC | PRN
  Administered 2018-09-10: 50 mg via INTRAVENOUS

## 2018-09-10 MED ORDER — SODIUM CHLORIDE 0.9 % IV SOLN
INTRAVENOUS | Status: DC
  Administered 2018-09-10: 08:00:00 via INTRAVENOUS

## 2018-09-10 NOTE — Anesthesia Postprocedure Evaluation (Signed)
Anesthesia Post Note  Patient: Debra Saunders  Procedure(s) Performed: COLONOSCOPY WITH PROPOFOL (N/A )  Patient location during evaluation: PACU Anesthesia Type: General Level of consciousness: awake and alert Pain management: pain level controlled Vital Signs Assessment: post-procedure vital signs reviewed and stable Respiratory status: spontaneous breathing, nonlabored ventilation, respiratory function stable and patient connected to nasal cannula oxygen Cardiovascular status: blood pressure returned to baseline and stable Postop Assessment: no apparent nausea or vomiting Anesthetic complications: no     Last Vitals:  Vitals:   09/10/18 0902 09/10/18 0910  BP: 117/79 (!) 105/43  Pulse: 60 63  Resp: 16 20  Temp: (!) 36.1 C   SpO2: 98% 100%    Last Pain:  Vitals:   09/10/18 0900  TempSrc: Tympanic  PainSc:                  Durenda Hurt

## 2018-09-10 NOTE — Transfer of Care (Signed)
Immediate Anesthesia Transfer of Care Note  Patient: Debra Saunders  Procedure(s) Performed: COLONOSCOPY WITH PROPOFOL (N/A )  Patient Location: Endoscopy Unit  Anesthesia Type:General  Level of Consciousness: awake  Airway & Oxygen Therapy: Patient Spontanous Breathing and Patient connected to face mask oxygen  Post-op Assessment: Report given to RN and Post -op Vital signs reviewed and stable  Post vital signs: Reviewed  Last Vitals:  Vitals Value Taken Time  BP 117/79 09/10/2018  9:02 AM  Temp 36.1 C 09/10/2018  9:02 AM  Pulse 60 09/10/2018  9:02 AM  Resp 16 09/10/2018  9:02 AM  SpO2 98 % 09/10/2018  9:02 AM    Last Pain:  Vitals:   09/10/18 0900  TempSrc: Tympanic  PainSc:          Complications: No apparent anesthesia complications

## 2018-09-10 NOTE — Anesthesia Preprocedure Evaluation (Addendum)
Anesthesia Evaluation  Patient identified by MRN, date of birth, ID band Patient awake    Reviewed: Allergy & Precautions, H&P , NPO status , Patient's Chart, lab work & pertinent test results  Airway Mallampati: II  TM Distance: >3 FB     Dental  (+) Missing   Pulmonary shortness of breath and Long-Term Oxygen Therapy, sleep apnea , COPD,  oxygen dependent, neg recent URI, former smoker,  Mod to severe pulmonary hypertension requiring 4L O2 ATC          Cardiovascular hypertension, +CHF    -moderate to severe pulmonary HTN requiring 4L O2 ATC  Echo 04/16/18:- moderate LVH. EF 60% to 65%. Left ventricular diastolic function parameters were normal. - Aortic valve: Sclerosis without stenosis. - Mitral valve: There was mild regurgitation. - Left atrium: The atrium was moderately dilated. - Pulmonary arteries: PA peak pressure: 52 mm Hg (S).    Neuro/Psych CVA negative psych ROS   GI/Hepatic negative GI ROS, Neg liver ROS,   Endo/Other  diabetesHypothyroidism   Renal/GU Renal disease  negative genitourinary   Musculoskeletal   Abdominal   Peds  Hematology  (+) Blood dyscrasia, anemia ,   Anesthesia Other Findings Past Medical History: No date: Arthritis No date: Atrial fibrillation, persistent     Comment:  a. s/p TEE-guided DCCV on 08/20/2017 No date: Cardiorenal syndrome     Comment:  a. 09/2017 Creat rose to 2.7 w/ diuresis-->HD x 2. No date: Chest pain     Comment:  a. 01/2012 Cath: nonobs dzs;  c. 04/2014 MV: No ischemia. No date: Chronic back pain No date: Chronic combined systolic (congestive) and diastolic  (congestive) heart failure ([place])     Comment:  a. Dx 2005 @ [facility];  b. 02/2014 Echo: Ef 55-60%, no rwma,               mild MR, PASP 62mmHg. c. 07/2017: EF 30-35%, diffuse HK,               trivial AI, severe MR, moderate TR; d. 09/2017 Echo: EF               40-45%, ant, antsep, ap, basal HK; e. 02/2018  Echo: EF               55-60%, Gr1 DD. Ao sclerosis w/o stenosis. Mild to mod               MR. Mildly dil LA. Mildly reduced RV fxn. Mild TR. PASP               35-51mmHg. No date: Chronic respiratory failure ([place])     Comment:  a. on home O2 No date: COPD (chronic obstructive pulmonary disease) ([place])     Comment:  a. Home O2 - 3lpm No date: Gastritis No date: History of intracranial hemorrhage     Comment:  a. 6/14 described by neurosurgery as "small frontal               posttraumatic SAH " - pt was on xarelto @ the time. No date: Hyperlipidemia No date: Hypertension No date: Lipoma of back No date: Lymphedema     Comment:  a. Chronic LLE edema. No date: NICM (nonischemic cardiomyopathy) ([place])     Comment:  a. 09/2017 Echo: EF 40-45%; b. 09/2017 Cath: minor,               insignificant CAD; c. 02/2018 Echo: EF 55-60%, Gr1 DD. No date: Obesity No date:  PAH (pulmonary artery hypertension) ([place])     Comment:  a. 09/2017 Echo: PASP 58mmHg; b. 09/2017 RHC: RA 23, RV               84/13, PA 84/29, PCWP 20, CO 3.89, CI 1.89. LVEDP 18. No date: Sepsis with metabolic encephalopathy ([place]) No date: Sleep apnea No date: Streptococcal bacteremia No date: Stroke [facility]) No date: Symptomatic bradycardia     Comment:  a. s/p BSX PPM. No date: Thyroid nodule No date: Type II diabetes mellitus ([place]) No date: Urinary incontinence  Past Surgical History: 1969: ABDOMINAL HYSTERECTOMY 2013: CARDIAC CATHETERIZATION     Comment:  @ [place]: No obstructive CAD: Only 20% ostial left               circumflex. 10/12/2017: CARDIOVERSION; N/A     Comment:  Procedure: CARDIOVERSION;  Surgeon: Minna Merritts,               MD;  Location: [facility] ORS;  Service: Cardiovascular;                Laterality: N/A; No date: CHOLECYSTECTOMY 2002: CYST REMOVAL TRUNK     Comment:  BACK 10/06/2017: DIALYSIS/PERMA CATHETER INSERTION; N/A     Comment:  Procedure: DIALYSIS/PERMA CATHETER INSERTION;  Surgeon:                Katha Cabal, MD;  Location: [place] CV LAB;               Service: Cardiovascular;  Laterality: N/A; No date: INSERT / REPLACE / REMOVE PACEMAKER 2014: lipoma removal     Comment:  back 2015: PACEMAKER INSERTION     Comment:  [place] dual chamber pacemaker implanted by Dr              Caryl Comes for symptomatic bradycardia 01/09/2014: PERMANENT PACEMAKER INSERTION; N/A     Comment:  Procedure: PERMANENT PACEMAKER INSERTION;  Surgeon:               Deboraha Sprang, MD;  Location: [facility] CATH LAB;  Service:               Cardiovascular;  Laterality: N/A; 04/19/2018: RIGHT HEART CATH; N/A     Comment:  Procedure: RIGHT HEART CATH;  Surgeon: Larey Dresser,              MD;  Location: [place] CV LAB;  Service:               Cardiovascular;  Laterality: N/A; 10/19/2017: RIGHT/LEFT HEART CATH AND CORONARY ANGIOGRAPHY; N/A     Comment:  Procedure: RIGHT/LEFT HEART CATH AND CORONARY               ANGIOGRAPHY;  Surgeon: Wellington Hampshire, MD;  Location:               [place] CV LAB;  Service: Cardiovascular;                Laterality: N/A; 08/20/2017: TEE WITHOUT CARDIOVERSION; N/A     Comment:  Procedure: TRANSESOPHAGEAL ECHOCARDIOGRAM (TEE) & Direct              current cardioversion;  Surgeon: Minna Merritts, MD;                Location: [facility] ORS;  Service: Cardiovascular;                Laterality: N/A; No date: TRIGGER FINGER RELEASE No  date: VAGINAL HYSTERECTOMY     Reproductive/Obstetrics negative OB ROS                           Anesthesia Physical Anesthesia Plan  ASA: IV  Anesthesia Plan: General   Post-op Pain Management:    Induction:   PONV Risk Score and Plan: Propofol infusion and TIVA  Airway Management Planned: Simple Face Mask  Additional Equipment:   Intra-op Plan:   Post-operative Plan:   Informed Consent: I have reviewed the patients History and Physical, chart, labs and discussed the procedure including  the risks, benefits and alternatives for the proposed anesthesia with the patient or authorized representative who has indicated his/her understanding and acceptance.     Dental Advisory Given  Plan Discussed with: Anesthesiologist and CRNA  Anesthesia Plan Comments:        Anesthesia Quick Evaluation

## 2018-09-10 NOTE — Anesthesia Post-op Follow-up Note (Signed)
Anesthesia QCDR form completed.        

## 2018-09-10 NOTE — H&P (Signed)
Primary Care Physician:  [name], MD Primary Gastroenterologist:  Dr. Vira Agar  Pre-Procedure History & Physical: HPI:  Debra Saunders is a 73 y.o. female is here for an colonoscopy.   Past Medical History:  Diagnosis Date  . Arthritis   . Atrial fibrillation, persistent    a. s/p TEE-guided DCCV on 08/20/2017  . Cardiorenal syndrome    a. 09/2017 Creat rose to 2.7 w/ diuresis-->HD x 2.  . Chest pain    a. 01/2012 Cath: nonobs dzs;  c. 04/2014 MV: No ischemia.  . Chronic back pain   . Chronic combined systolic (congestive) and diastolic (congestive) heart failure ([place])    a. Dx 2005 @ [facility];  b. 02/2014 Echo: Ef 55-60%, no rwma, mild MR, PASP 79mHg. c. 07/2017: EF 30-35%, diffuse HK, trivial AI, severe MR, moderate TR; d. 09/2017 Echo: EF 40-45%, ant, antsep, ap, basal HK; e. 02/2018 Echo: EF 55-60%, Gr1 DD. Ao sclerosis w/o stenosis. Mild to mod MR. Mildly dil LA. Mildly reduced RV fxn. Mild TR. PASP 35-441mg.  . Marland Kitchenhronic respiratory failure (HCC)    a. on home O2  . COPD (chronic obstructive pulmonary disease) ([facility]   a. Home O2 - 3lpm  . Gastritis   . History of intracranial hemorrhage    a. 6/14 described by neurosurgery as "small frontal posttraumatic SAH " - pt was on xarelto @ the time.  . Hyperlipidemia   . Hypertension   . Lipoma of back   . Lymphedema    a. Chronic LLE edema.  . Marland KitchenICM (nonischemic cardiomyopathy) ([facility]   a. 09/2017 Echo: EF 40-45%; b. 09/2017 Cath: minor, insignificant CAD; c. 02/2018 Echo: EF 55-60%, Gr1 DD.  . Marland Kitchenbesity   . PAH (pulmonary artery hypertension) ([facility]   a. 09/2017 Echo: PASP 6591m; b. 09/2017 RHC: RA 23, RV 84/13, PA 84/29, PCWP 20, CO 3.89, CI 1.89. LVEDP 18.  . Sepsis with metabolic encephalopathy ([facility] [place] . Sleep apnea   . Streptococcal bacteremia   . Stroke ([place] . Symptomatic bradycardia    a. s/p BSX PPM.  . Thyroid nodule   . Type II diabetes mellitus ([place] . Urinary incontinence     Past Surgical History:   Procedure Laterality Date  . ABDOMINAL HYSTERECTOMY  1969  . CARDIAC CATHETERIZATION  2013   [place] [facility] [place]: Only 20% ostial left circumflex.  . CMarland KitchenRDIOVERSION N/A 10/12/2017   Procedure: CARDIOVERSION;  Surgeon: GolMinna MerrittsD;  Location: [facility]S;  Service: Cardiovascular;  Laterality: N/A;  . CHOLECYSTECTOMY    . CYST REMOVAL TRUNK  2002   BACK  . DIALYSIS/PERMA CATHETER INSERTION N/A 10/06/2017   Procedure: DIALYSIS/PERMA CATHETER INSERTION;  Surgeon: SchKatha CabalD;  [facility];  Service: Cardiovascular;  Laterality: N/A;  . INSERT / REPLACE / REMOVE PACEMAKER    . lipoma removal  2014   back  . PACEMAKER INSERTION  [address] dual chamber pacemaker implanted by Dr KleCaryl Comesr symptomatic bradycardia  . PERMANENT PACEMAKER INSERTION N/A 01/09/2014   Procedure: PERMANENT PACEMAKER INSERTION;  Surgeon: SteDeboraha SprangD;  [facility];  Service: Cardiovascular;  Laterality: N/A;  . RIGHT HEART CATH N/A 04/19/2018   Procedure: RIGHT HEART CATH;  Surgeon: McLLarey DresserD;  [facility];  Service: Cardiovascular;  Laterality: N/A;  . RIGHT/LEFT HEART CATH AND CORONARY ANGIOGRAPHY N/A 10/19/2017   Procedure:  RIGHT/LEFT HEART CATH AND CORONARY ANGIOGRAPHY;  Surgeon: Wellington Hampshire, MD;  Location: [place] [facility];  Service: Cardiovascular;  Laterality: N/A;  . TEE WITHOUT CARDIOVERSION N/A 08/20/2017   Procedure: TRANSESOPHAGEAL ECHOCARDIOGRAM (TEE) & Direct current cardioversion;  Surgeon: Minna Merritts, MD;  Location: [facility] ORS;  Service: Cardiovascular;  Laterality: N/A;  . TRIGGER FINGER RELEASE    . VAGINAL HYSTERECTOMY      Prior to Admission medications   Medication Sig Start Date End Date Taking? Authorizing Provider  amLODipine (NORVASC) 10 MG tablet Take 1 tablet (10 mg total) by mouth daily. 08/20/18  Yes Larey Dresser, MD  digoxin (LANOXIN) 0.125 MG tablet Take 1 tablet (0.125 mg  total) by mouth daily. 08/04/18  Yes Larey Dresser, MD  gabapentin (NEURONTIN) 100 MG capsule Take 2 capsules (200 mg total) by mouth 3 (three) times daily. 09/09/18  Yes [name], MD  macitentan (OPSUMIT) 10 MG tablet Take 1 tablet (10 mg total) by mouth daily. 04/27/18  Yes Georgiana Shore, NP  metFORMIN (GLUCOPHAGE) 500 MG tablet Take 500 mg by mouth 2 (two) times daily with a meal.    Yes [provider]  potassium chloride SA (K-DUR,KLOR-CON) 20 MEQ tablet Take 1 tablet (20 mEq total) by mouth 2 (two) times daily. 05/11/18  Yes Wellington Hampshire, MD  rosuvastatin (CRESTOR) 10 MG tablet Take 1 tablet (10 mg total) by mouth daily. 11/30/17  Yes [name], MD  torsemide (DEMADEX) 20 MG tablet TAKE 3 TABLETS BY MOUTH TWICE DAILY 08/27/18  Yes Larey Dresser, MD  Accu-Chek Softclix Lancets lancets TEST UP TO FOUR TIMES DAILY AS DIRECTED 09/07/18   [name], MD  acetaminophen (TYLENOL) 325 MG tablet Take 325-650 mg by mouth every 6 (six) hours as needed for mild pain. Reported on 10/10/2015    [provider]  albuterol (PROVENTIL) (2.5 MG/3ML) 0.083% nebulizer solution Take 3 mLs (2.5 mg total) by nebulization every 6 (six) hours. 08/28/17   Loletha Grayer, MD  apixaban (ELIQUIS) 5 MG TABS tablet Take 1 tablet (5 mg total) by mouth 2 (two) times daily. 08/30/18   Larey Dresser, MD  Blood Glucose Monitoring Suppl (ACCU-CHEK AVIVA PLUS) w/Device KIT USE AS DIRECTED 07/20/18   [name], MD  glucose blood (TRUE METRIX BLOOD GLUCOSE TEST) test strip Check blood glucose twice daily Dx:E11.8, Z79.4 09/17/17   [name], MD  glucose blood test strip Use as instructed 05/13/18   [name], MD  meclizine (ANTIVERT) 25 MG tablet Take 1 tablet (25 mg total) by mouth 3 (three) times daily as needed for dizziness. 07/08/18   [name], MD  Menthol, Topical Analgesic, (BIOFREEZE EX) Apply 1 application topically as needed (for pain).      [provider]  ondansetron (ZOFRAN) 8 MG tablet Take 1 tablet (8 mg total) by mouth every 8 (eight) hours as needed for nausea or vomiting. Patient not taking: Reported on 08/26/2018 09/25/14   Jackolyn Confer, MD  OXYGEN Inhale 4 L into the lungs continuous.    [provider]  Selexipag (UPTRAVI) 200 MCG TABS Take 1 tablet (200 mcg total) by mouth 2 (two) times daily. Patient not taking: Reported on 07/22/2018 07/22/18   Larey Dresser, MD  sildenafil (REVATIO) 20 MG tablet Take 20 mg by mouth 3 (three) times daily. 05/20/17   [provider]  spironolactone (ALDACTONE) 25 MG tablet Take 1 tablet (25 mg  total) by mouth at bedtime. 05/18/18 09/09/18  Georgiana Shore, NP  [name] 0.16 PV/3.7SM SOPN INJECT 0.75MG SUBCUTANEOUSLY ONCE A WEEK 12/22/17   [name], MD    Allergies as of 07/30/2018 - Review Complete 07/07/2018  Allergen Reaction Noted  . Aspirin Hives and Other (See Comments) 03/06/2014  . Other Other (See Comments) 04/12/2013  . Penicillins Hives, Shortness Of Breath, Swelling, and Other (See Comments) 01/15/2012    Family History  Problem Relation Age of Onset  . Heart disease Mother   . Hypertension Mother   . COPD Mother        was a smoker  . Thyroid disease Mother   . Hypertension Father   . Hypertension Sister   . Thyroid disease Sister   . Hypertension Sister   . Thyroid disease Sister   . Breast cancer Maternal Aunt   . Kidney disease Neg Hx   . Bladder Cancer Neg Hx     Social History   Socioeconomic History  . Marital status: Divorced    Spouse name: Not on file  . Number of children: 4  . Years of education: 53  . Highest education level: High school graduate  Occupational History  . Occupation: Retired- factory work    Comment: exposed to [employer]  . Financial resource strain: Not hard at all  . Food insecurity:    Worry: Never true    Inability: Never true  . Transportation needs:     Medical: Yes    Non-medical: Yes  Tobacco Use  . Smoking status: Former Smoker    Packs/day: 0.30    Years: 2.00    Pack years: 0.60    Types: Cigarettes    Last attempt to quit: 06/23/1990    Years since quitting: 28.2  . Smokeless tobacco: Never Used  Substance and Sexual Activity  . Alcohol use: No  . Drug use: No  . Sexual activity: Not Currently  Lifestyle  . Physical activity:    Days per week: 0 days    Minutes per session: 0 min  . Stress: To some extent  Relationships  . Social connections:    Talks on phone: More than three times a week    Gets together: Once a week    Attends religious service: Never    Active member of club or organization: No    Attends meetings of clubs or organizations: Never    Relationship status: Divorced  . Intimate partner violence:    Fear of current or ex partner: Patient refused    Emotionally abused: Patient refused    Physically abused: Patient refused    Forced sexual activity: Patient refused  Other Topics Concern  . Not on file  Social History Narrative   Lives in [place] with daughter. Has 4 children. No pets.      Work - Restaurant manager, fast food, retired      Diet - regular    Review of Systems: See HPI, otherwise negative ROS  Physical Exam: BP 135/63   Pulse (!) 58   Temp 97.8 F (36.6 C) (Tympanic)   Resp 18   Ht 5' (1.524 m)   Wt 97.5 kg   SpO2 98%   BMI 41.99 kg/m  General:   Alert,  pleasant and cooperative in NAD Head:  Normocephalic and atraumatic. Neck:  Supple; no masses or thyromegaly. Lungs:  Clear throughout to auscultation.    Heart:  Regular rate and rhythm. Abdomen:  Soft,  nontender and nondistended. Normal bowel sounds, without guarding, and without rebound.   Neurologic:  Alert and  oriented x4;  grossly normal neurologically.  Impression/Plan: Debra Saunders is here for an colonoscopy to be performed for heme positive stool and chronic anemia.  Risks, benefits, limitations, and alternatives  regarding  colonoscopy have been reviewed with the patient.  Questions have been answered.  All parties agreeable.   Gaylyn Cheers, MD  09/10/2018, 8:18 AM

## 2018-09-10 NOTE — Op Note (Signed)
[facility] Gastroenterology Patient Name: Debra Saunders Procedure Date: 09/10/2018 8:18 AM MRN: [id number] Account #: [id number] Date of Birth: Oct 24, 1945 Admit Type: Outpatient Age: 73 Room: [facility] ENDO ROOM 3 Gender: Female Note Status: Finalized Procedure:            Colonoscopy Indications:          Therapeutic procedure for known colon polyp Providers:            Manya Silvas, MD Referring MD:         [name]. [name] (Referring MD) Medicines:            Propofol per Anesthesia Complications:        No immediate complications. Procedure:            Pre-Anesthesia Assessment:                       - After reviewing the risks and benefits, the patient                        was deemed in satisfactory condition to undergo the                        procedure.                       After obtaining informed consent, the colonoscope was                        passed under direct vision. Throughout the procedure,                        the patient's blood pressure, pulse, and oxygen                        saturations were monitored continuously. The                        Colonoscope was introduced through the anus and                        advanced to the the cecum, identified by appendiceal                        orifice and ileocecal valve. The colonoscopy was                        performed without difficulty. The patient tolerated the                        procedure well. The quality of the bowel preparation                        was good. Findings:      A 30 mm polyp was found in the proximal sigmoid colon. The polyp was       semi-pedunculated. The polyp was removed with a hot snare. Resection and       retrieval were complete. To prevent bleeding after the polypectomy, one       hemostatic clip was successfully placed. There was no bleeding during,       or at the end, of the procedure.  Many medium-mouthed diverticula were found in the  sigmoid colon,       descending colon and transverse colon.      Two sessile polyps were found in the transverse colon. The polyps were       diminutive in size. These polyps were removed with a jumbo cold forceps.       Cold snare for small polyp. Resection and retrieval were complete. Impression:           - One 30 mm polyp in the proximal sigmoid colon,                        removed with a hot snare. Resected and retrieved. Clip                        was placed.                       - Diverticulosis in the sigmoid colon, in the                        descending colon and in the transverse colon.                       - Two diminutive polyps in the transverse colon,                        removed with a jumbo cold forceps. Resected and                        retrieved. Recommendation:       - Await pathology results. Manya Silvas, MD 09/10/2018 9:05:57 AM This report has been signed electronically. Number of Addenda: 0 Note Initiated On: 09/10/2018 8:18 AM Scope Withdrawal Time: 0 hours 15 minutes 21 seconds  Total Procedure Duration: 0 hours 25 minutes 55 seconds       [facility]

## 2018-09-13 ENCOUNTER — Other Ambulatory Visit (HOSPITAL_COMMUNITY): Payer: Self-pay | Admitting: Unknown Physician Specialty

## 2018-09-13 ENCOUNTER — Other Ambulatory Visit: Payer: Self-pay | Admitting: Unknown Physician Specialty

## 2018-09-13 ENCOUNTER — Ambulatory Visit: Payer: Medicare HMO | Admitting: Pulmonary Disease

## 2018-09-13 DIAGNOSIS — E041 Nontoxic single thyroid nodule: Secondary | ICD-10-CM

## 2018-09-13 LAB — SURGICAL PATHOLOGY

## 2018-09-14 DIAGNOSIS — I509 Heart failure, unspecified: Secondary | ICD-10-CM | POA: Diagnosis not present

## 2018-09-15 ENCOUNTER — Telehealth (HOSPITAL_COMMUNITY): Payer: Self-pay | Admitting: Licensed Clinical Social Worker

## 2018-09-15 NOTE — Telephone Encounter (Signed)
Patient identified as a candidate to receive 14 heart healthy meals per week for 3 months through [facility] partnership with [employer] program.   Completed referral sent in for review.  Anticipate patient will receive first shipment of food in 1-3 business days.  Debra Saunders H. Deatra Mcmahen, LCSW Clinical Social Worker [facility] Desk#: [phone] Cell#: [phone]   

## 2018-09-16 ENCOUNTER — Other Ambulatory Visit (HOSPITAL_COMMUNITY): Payer: Self-pay

## 2018-09-16 NOTE — Progress Notes (Signed)
Visit today was with [name] at home to do her medications.  She states her weight is down.  She states appetite not real good, she complains about not able to eat meat, she feels like she can not swallow it.  She just had colonoscopy and thyroid checked. She has been eating mostly fruits and vegetables.  Swelling in down in extremities.  She states feels good, tires out easily.  She tries to stay active in the home.  Meds verified.  Called in her sildinafil and opsumit.  Contacted Heart failure clinic in [place] about [name], she has yet received.  They advised they see she has been approved and if she does not get it by early next week, call them back.  She denies chest pain, shortness of breath at rest, headaches or dizziness.  Meds placed in box for next 2 weeks.  Will continue to visit by phone or home.   [place] [phone]

## 2018-09-17 ENCOUNTER — Encounter: Payer: Self-pay | Admitting: Family Medicine

## 2018-09-17 ENCOUNTER — Other Ambulatory Visit: Payer: Self-pay | Admitting: Family Medicine

## 2018-09-17 ENCOUNTER — Encounter: Payer: Self-pay | Admitting: Unknown Physician Specialty

## 2018-09-23 ENCOUNTER — Other Ambulatory Visit (HOSPITAL_COMMUNITY): Payer: Self-pay

## 2018-09-23 ENCOUNTER — Encounter (HOSPITAL_COMMUNITY): Payer: Self-pay

## 2018-09-23 NOTE — Progress Notes (Signed)
Had a telephone visit with Debra Saunders today.  She states doing ok, her weight is 213 lbs.  Her weight has been going down.  She states her appetite has decreased, not eating as much.  [name] came, she has a nurse calling her tomorrow of how to take it.  She has all her meds and she has another week with meds in med box that I prepared last week.  She states swelling is down.  She denies chest pain, shortness of breath at rest, headaches or dizziness.  She appears to be in good spirits.  Will visit next week at home for heart failure and med compliance.  She should be receiving [employer] next week. She is aware to call me if feels bad and I will make a visit.   [name] [phone]

## 2018-09-24 ENCOUNTER — Ambulatory Visit: Payer: Medicare HMO | Admitting: Family Medicine

## 2018-09-26 DIAGNOSIS — J9611 Chronic respiratory failure with hypoxia: Secondary | ICD-10-CM | POA: Diagnosis not present

## 2018-09-30 ENCOUNTER — Encounter (HOSPITAL_COMMUNITY): Payer: Self-pay

## 2018-09-30 ENCOUNTER — Telehealth (HOSPITAL_COMMUNITY): Payer: Self-pay | Admitting: Licensed Clinical Social Worker

## 2018-09-30 ENCOUNTER — Other Ambulatory Visit (HOSPITAL_COMMUNITY): Payer: Self-pay

## 2018-09-30 ENCOUNTER — Other Ambulatory Visit: Payer: Self-pay | Admitting: Family Medicine

## 2018-09-30 NOTE — Telephone Encounter (Signed)
CSW called pt to check in regarding first [employer] Deliveries.  Patient reports that deliveries have been going smoothly and she is enjoying the food.  Pt reports she is having some trouble adjusting to her new medication, Uptravi, but other than that she is doing well and expresses no other concerns.  CSW will continue to follow and assist as needed.  Jorge Ny, LCSW Clinical Social Worker [facility] Desk#: [phone] Cell#: [phone]

## 2018-09-30 NOTE — Progress Notes (Signed)
Today was a visit with Debra Saunders at home.  Meds were verified and meds placed in med boxes.  Called in refills for spirolactone and gabapentin. She states she is feeling ok, [name] she is having some of the side effects and will advise them tomorrow when they call, she is having the jaw pain when eat and some diarrhea.  She states her appetite is not the greatest but she has been enjoying the [employer]oms meals.  She states they are easy to fix and taste good.  We did order the ones she liked today for 2 weeks.  She weighs everyday, her weight has been going down slowly.  She watches what she eats and drinks.  She is not exercising, talked about PT at home.  She states she tires out too quick.  She is able to some light house cleaning and cooks very little.  She states sleepy and not wanting to get out of bed early, she lays in bed a lot longer.  She denies chest pain, dizziness or shortness of breath at rest.  Will continue to visit for heart failure, diet and med compliance.   [place] [phone]

## 2018-09-30 NOTE — Telephone Encounter (Signed)
Refilled: 09/09/2018 Last OV: 06/25/2018 Next OV: 07/05/2019

## 2018-10-04 ENCOUNTER — Other Ambulatory Visit (HOSPITAL_COMMUNITY): Payer: Self-pay

## 2018-10-04 MED ORDER — SPIRONOLACTONE 25 MG PO TABS: 25.0000 mg | ORAL_TABLET | Freq: Every day | ORAL | 6 refills | Status: DC

## 2018-10-07 ENCOUNTER — Telehealth (HOSPITAL_COMMUNITY): Payer: Self-pay

## 2018-10-07 NOTE — Telephone Encounter (Signed)
Today had a visit by phone with Debra Saunders.  She states doing ok, she states she feels like she has a stomach bug, been having diarrhea and feeling tired.  She has been having side effects from the [place], they were suppose to call her but they have not.  She shows has appt with [name] next week, spoke with HF clinic and they advised that they will be calling her to change that appt.  [name] of same.  She states she has been eating the [employer]oms meals but her appetite can only handle about 1 meal a day.  Her weight is down to 211 lbs.  She denies chest pain, been having headaches, no shortness of breath at rest.  Her swelling is down for her.  She states dont have no energy, tires out very quick.  She has all her medications and her med box is filled from last weeks visit.  Will continue to make home visit every other week and telephone on off weeks at this time with the Coronavirus pandemic.  She has 2 med boxes I fill at this time. Will continue to watch for Heart Failure, diet and medication compliance.   [place] [phone]

## 2018-10-12 ENCOUNTER — Encounter (HOSPITAL_COMMUNITY): Payer: Medicare HMO | Admitting: Cardiology

## 2018-10-13 ENCOUNTER — Encounter (HOSPITAL_COMMUNITY): Payer: Self-pay

## 2018-10-13 ENCOUNTER — Other Ambulatory Visit (HOSPITAL_COMMUNITY): Payer: Self-pay

## 2018-10-13 NOTE — Progress Notes (Signed)
Today was a visit with Debra Saunders at home.  She states just tired all the time.  She states still having side effects from the uptravi, she states nurse never called back.  Contacted the nurse that cam out and advised her the side effects and she wants her to go back on 1 twice a day and if they do not subside to call her back in a few days.  She states will contact her doctor and advise.  Her blood pressure is coming down, she states weight is coming down slowly now.  She states does not eat much a day but has been eating the moms meals.  She watches her sodium and fluids.  She denies chest pain, shortness of breath at rest, she has a headache and denies dizziness.  Meds verified and called in refills for potassium, gabapentin, opsumit and sildinafil.  Will continue to visit for heart failure, diet and medication compliance.  [place] [phone]

## 2018-10-15 DIAGNOSIS — I509 Heart failure, unspecified: Secondary | ICD-10-CM | POA: Diagnosis not present

## 2018-10-18 ENCOUNTER — Ambulatory Visit (HOSPITAL_COMMUNITY)
Admission: RE | Admit: 2018-10-18 | Discharge: 2018-10-18 | Disposition: A | Discharge status: Home / Self Care | Payer: Medicare HMO | Source: Ambulatory Visit | Attending: Cardiology | Admitting: Cardiology

## 2018-10-18 ENCOUNTER — Other Ambulatory Visit: Payer: Self-pay

## 2018-10-18 DIAGNOSIS — I480 Paroxysmal atrial fibrillation: Secondary | ICD-10-CM

## 2018-10-18 DIAGNOSIS — I272 Pulmonary hypertension, unspecified: Secondary | ICD-10-CM

## 2018-10-19 NOTE — Progress Notes (Signed)
Heart Failure TeleHealth Note  Due to national recommendations of social distancing due to [place] 19, Audio/video telehealth visit is felt to be most appropriate for this patient at this time.  See MyChart message from today for patient consent regarding telehealth for [facility].  Date:  10/19/2018   ID:  Debra Saunders, DOB Nov 01, 1945, MRN [id number]  Location: Home  Provider location: [place] [facility] Type of Visit: Established patient   PCP:  [name], MD  Cardiologist:  [name], MD Primary HF: Dr. [name]  Chief Complaint: Shortness of breath   History of Present Illness: Debra Saunders is a 73 y.o. female who presents via audio/video conferencing for a telehealth visit today.   We attempted a video but were unable to get picture so had to settle for audio only.   she denies symptoms worrisome for COVID 19.   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  Admitted 10/24-10/29/19 with acute on chronic diastolic HF. HF team consulted with concerns for low output. PICC was placed and milrinone to support RV function was started along with IV Lasix to facilitate diuresis. RHC done to evaluate PAH (see below). V/Q scan negative. High res CT showed pulmonary fibrosis. Pulmonary consult and recommended stopping amiodarone. She was transitioned to torsemide 60 mg BID. DC weight: 226 lbs.   She has started on selexipag for pulmonary hypertension in addition to Opsumit and Revatio.  She developed jaw pain and diarrhea at 400 mcg bid so is back on 200 mcg bid.  She does think that her breathing is overall better.  She is short of breath  with moderate exertion like doing laundary.  She chronically uses 4L home oxygen.  No chest pain.  No lightheadedness.   BP is better on higher dose of amlodipine.   6 minute walk (1/20): 98 m  Labs (11/19): digoxin 0.8 Labs (1/20): K 4.2, creatinine 1.36 => 1.33, digoxin 0.8 Labs (2/20): digoxin 0.2, K 4.3, creatinine 1.32  PMH: 1. Lymphedema: Left lower leg (chronic).  2. COPD 3. 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  - PFTs (2017) with FEV1 64%, FEV1/FVC ratio 106%, TLC 49%, DLCO 39% (restrictive).  - CT chest high resolution (10/19): Pulmonary parenchymal pattern of fibrosis.  4. Atrial fibrillation: Paroxysmal.  She is off amiodarone due to lung disease.  - 2/19 DCCV.  5. H/o DVT s/p IVC filter in 2004.  6. CAD: 4/19 coronary angiography with minimal coronary disease.  7. Bradycardia: [employer] PPM.  8. H/o traumatic SAH 2014.  9. CKD: Stage 3.  10. H/o CVA 11. OSA: CPAP 12. HTN 13. Type II diabetes 14. Pulmonary hypertension:  - PFTs 2017 with severe restriction.  - Echo (10/19): EF 60-65%, PASP 52 mmHg - RHC (4/19): Severe PAH with PVR 7.2 WU.  - RHC (10/19): mean RA 6, PA 56/17 mean 33, PCWP mean 12, CI 3.23, PVR 3.4 WU.  - V/Q scan (10/19): No chronic PE.  - Anti-SCL 70 and ANA negative.  ACE normal.  RF slightly elevated at 15.  FH: mother with CHF 23. 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   Current Outpatient Medications  Medication Sig Dispense Refill  . Accu-Chek Softclix Lancets  lancets TEST UP TO FOUR TIMES DAILY AS DIRECTED 100 each 12  . acetaminophen (TYLENOL) 325 MG tablet Take 325-650 mg by mouth every 6 (six) hours as needed for mild pain. Reported on 10/10/2015    . albuterol (PROVENTIL) (2.5 MG/3ML) 0.083% nebulizer solution Take 3 mLs (2.5 mg total) by nebulization every 6 (six) hours. 75 mL 12  . amLODipine (NORVASC) 10 MG tablet Take 1 tablet (10 mg total) by mouth daily. 30 tablet 6  . apixaban  (ELIQUIS) 5 MG TABS tablet Take 1 tablet (5 mg total) by mouth 2 (two) times daily. 60 tablet 11  . Blood Glucose Monitoring Suppl (ACCU-CHEK AVIVA PLUS) w/Device KIT USE AS DIRECTED 1 kit 0  . cyanocobalamin 1000 MCG tablet Take 1,000 mcg by mouth daily. Daily in am    . digoxin (LANOXIN) 0.125 MG tablet Take 1 tablet (0.125 mg total) by mouth daily. 30 tablet 3  . gabapentin (NEURONTIN) 100 MG capsule TAKE 2 CAPSULES BY MOUTH THREE TIMES DAILY 180 capsule 0  . glucose blood (TRUE METRIX BLOOD GLUCOSE TEST) test strip Check blood glucose twice daily Dx:E11.8, Z79.4 100 each 12  . glucose blood test strip Use as instructed 100 each 12  . macitentan (OPSUMIT) 10 MG tablet Take 1 tablet (10 mg total) by mouth daily. 30 tablet 3  . meclizine (ANTIVERT) 25 MG tablet Take 1 tablet (25 mg total) by mouth 3 (three) times daily as needed for dizziness. 30 tablet 0  . Menthol, Topical Analgesic, (BIOFREEZE EX) Apply 1 application topically as needed (for pain).     . metFORMIN (GLUCOPHAGE) 500 MG tablet Take 500 mg by mouth 2 (two) times daily with a meal.     . ondansetron (ZOFRAN) 8 MG tablet Take 1 tablet (8 mg total) by mouth every 8 (eight) hours as needed for nausea or vomiting. 30 tablet 2  . OXYGEN Inhale 4 L into the lungs continuous.    . potassium chloride SA (K-DUR,KLOR-CON) 20 MEQ tablet Take 1 tablet (20 mEq total) by mouth 2 (two) times daily. 180 tablet 3  . rosuvastatin (CRESTOR) 10 MG tablet Take 1 tablet (10 mg total) by mouth daily. 90 tablet 3  . Selexipag (UPTRAVI) 200 MCG TABS Take 1 tablet (200 mcg total) by mouth 2 (two) times daily. 60 tablet 3  . sildenafil (REVATIO) 20 MG tablet Take 20 mg by mouth 3 (three) times daily.    [name] spironolactone (ALDACTONE) 25 MG tablet Take 1 tablet (25 mg total) by mouth at bedtime. 30 tablet 6  . torsemide (DEMADEX) 20 MG tablet TAKE 3 TABLETS BY MOUTH TWICE DAILY 180 tablet 5  . [name] 8.27 MB/8.6LJ SOPN INJECT 0.75MG SUBCUTANEOUSLY ONCE A  WEEK 4 pen 2   No current facility-administered medications for this encounter.     Allergies:   Aspirin; Other; and Penicillins   Social History:  The patient  reports that she quit smoking about 28 years ago. Her smoking use included cigarettes. She has a 0.60 pack-year smoking history. She has never used smokeless tobacco. She reports that she does not drink alcohol or use drugs.   Family History:  The patient's family history includes Breast cancer in her maternal aunt; COPD in her mother; Heart disease in her mother; Hypertension in her father, mother, sister, and sister; Thyroid disease in her mother, sister, and sister.   ROS:  Please see the history of present illness.   All other systems are personally reviewed and negative.  Exam:  (Video/Tele Health Call; Exam is subjective and or/visual.) BP 137/61 (measured by patient) General:  Speaks in full sentences. No resp difficulty. Lungs: Normal respiratory effort with conversation.  Abdomen: Non-distended per patient report Extremities: Pt denies edema. Neuro: Alert & oriented x 3.   Recent Labs: 04/17/2018: TSH 0.808 04/19/2018: ALT 13 04/20/2018: Magnesium 2.0 05/18/2018: B Natriuretic Peptide 65.0 06/25/2018: Hemoglobin 10.2; Platelets 220 08/20/2018: BUN 20; Creatinine, Ser 1.32; Potassium 4.3; Sodium 138  Personally reviewed   Wt Readings from Last 3 Encounters:  10/13/18 95.7 kg (211 lb)  09/30/18 96.2 kg (212 lb)  09/23/18 96.6 kg (213 lb)      ASSESSMENT AND PLAN:  1. 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 Echo 10/19 showed EF 60-65% with moderate LVH, RV was difficult to see. During admission in 10/19, she required milrinone to support RV.  NYHA class III symptoms, likely due primarily to lung disease and pulmonary hypertension. She is somewhat improved symptomatically.  Weight down 4 lbs.   -Continuetorsemide 60 mg bid, I will arrange  for BMET.  2. Pulmonary hypertension: She was followed in the past at [facility]. Based on 4/19 cath, she had severe PAH with PVR 7.2 WU. She has been on sildenafil 20 mg tid x 4 years. Chronic hypoxemic respiratory failure, on home oxygen. She has a history of remote DVT (2004) but V/Q scan negative for evidence of chronic PE. High resolution CT chest in 10/19 showed pulmonary fibrosis, and last PFTs in 2017 showed severe restriction.  Serological workup for rheumatological diseases was unremarkable.  There has been concern for possible burnt-out sarcoidosis as cause of her interstitial fibrosis and hilar adenopathy. She has family history of this. Amiodarone was stopped given lung parenchymal disease. 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 Repeat RHC in 10/19 showedmoderate pulmonary hypertension, PVR 3.4 WU which is significantly lower than on the 4/19 cath. She remains quite limited.  - Continue sildenafil and Opsumit.   - She is on selexipag 200 mcg bid, decreased back from 400 mcg bid due to side effects.  She is willing to try the higher dose again.  - Continue digoxin for RV support, check digoxin level with next labs.  - Continue CPAP and home oxygen.  - Continue followup with pulmonary for ILD, ?sarcoidosis.  3. CKD stage 3: Arrange for BMET.  4. Atrial fibrillation: Paroxysmal.  Off amiodarone with parenchymal lung disease. No palpitations.  -Continue apixaban 5 mg bid.  5. OSA: Continue CPAP. 6. HTN: BP controlled on higher dose of amlodipine.  7. 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    COVID screen The patient does not have any symptoms that suggest any further testing/ screening at this time.  Social distancing reinforced today.  Patient Risk: After full review of this patients clinical status, I feel that they are at moderate risk for cardiac decompensation at this time.  Relevant cardiac medications were reviewed at length with the patient today. The patient  does not have concerns regarding their medications at this time.   Recommended follow-up:  2 months.   Today, I have spent 18 minutes with the patient with telehealth technology discussing the above issues .    Signed, [name], MD  10/19/2018 4:20 PM  [name] [address] [facility] [place] [address] [phone] (office) [phone] (fax)

## 2018-10-22 ENCOUNTER — Other Ambulatory Visit: Payer: Self-pay | Admitting: Cardiology

## 2018-10-22 DIAGNOSIS — I5022 Chronic systolic (congestive) heart failure: Secondary | ICD-10-CM | POA: Diagnosis not present

## 2018-10-23 LAB — BASIC METABOLIC PANEL
BUN/Creatinine Ratio: 15 (ref 12–28)
BUN: 25 mg/dL (ref 8–27)
CO2: 26 mmol/L (ref 20–29)
Calcium: 10.4 mg/dL — ABNORMAL HIGH (ref 8.7–10.3)
Chloride: 96 mmol/L (ref 96–106)
Creatinine, Ser: 1.67 mg/dL — ABNORMAL HIGH (ref 0.57–1.00)
GFR calc Af Amer: 34 mL/min/{1.73_m2} — ABNORMAL LOW (ref >59)
GFR calc non Af Amer: 30 mL/min/{1.73_m2} — ABNORMAL LOW (ref >59)
Glucose: 284 mg/dL — ABNORMAL HIGH (ref 65–99)
Potassium: 4.8 mmol/L (ref 3.5–5.2)
Sodium: 137 mmol/L (ref 134–144)

## 2018-10-23 LAB — DIGOXIN LEVEL: Digoxin, Serum: 0.8 ng/mL (ref 0.5–0.9)

## 2018-10-23 LAB — MAGNESIUM: Magnesium: 1.6 mg/dL (ref 1.6–2.3)

## 2018-10-25 ENCOUNTER — Telehealth (HOSPITAL_COMMUNITY): Payer: Self-pay | Admitting: Cardiology

## 2018-10-25 NOTE — Telephone Encounter (Signed)
Abnormal labs received from [place] drawn 10/22/18 BUN 25 Cr 1.67 Na 137 K 4.8 Digoxin 0.8   Per Lillia Mountain.NP Recheck labs x 1 BMET, Dig trough   Orders sent to [facility] for repeat labs

## 2018-10-26 ENCOUNTER — Telehealth (HOSPITAL_COMMUNITY): Payer: Self-pay

## 2018-10-26 NOTE — Telephone Encounter (Signed)
Patient is not active with [facility] since 12/2017 Will need to return for repeat labs  [facility]

## 2018-10-26 NOTE — Telephone Encounter (Signed)
-----   Message from Larey Dresser, MD sent at 10/25/2018 10:56 PM EDT ----- No change for now but creatinine higher, would repeat BMET in 10 days. g

## 2018-10-26 NOTE — Telephone Encounter (Signed)
Pt aware of lab results. Per Dr [name], pt to repeat in 10 days.  [facility] to go to [facility] Rx faxed to [facility] advised to go to lab 5/14 or 5/15. Verbalized understanding

## 2018-10-28 ENCOUNTER — Other Ambulatory Visit (HOSPITAL_COMMUNITY): Payer: Self-pay

## 2018-10-28 ENCOUNTER — Other Ambulatory Visit: Payer: Self-pay | Admitting: Family Medicine

## 2018-10-28 NOTE — Progress Notes (Signed)
Today was a visit with [name] at her home.  She states been doing ok, she states tired today.  She may have overdone things in the house today, she has been cleaning.  She states that side effects from [place] is a little better but still there.  She is still taking 1 twice daily.  Meds verified and med box refilled.  Called in her gabapentin, digoxin and uptravi.  She denies shortness of breath at rest, denies chest pain, she has headaches in the evening everyday, and denies dizziness.  She believes her headaches is coming from the [place].[name]  Her weight is still coming down.  Swelling is down in extremities.  She states abdomen does not feel tight.  She states appetite is better, she has been eating more meals a day.  She eats her Moms meals.  She watches her sodium and fluid intake.  Lungs are clear.  Will continue to visit at home every other week at this time due to covid 19.  Will visit for heart failure, diet and medication compliance.   [place] [phone]

## 2018-10-28 NOTE — Telephone Encounter (Signed)
Addressed  Done ok to file Pt will have labs repeated

## 2018-11-01 ENCOUNTER — Other Ambulatory Visit (HOSPITAL_COMMUNITY): Payer: Self-pay

## 2018-11-01 ENCOUNTER — Telehealth (HOSPITAL_COMMUNITY): Payer: Self-pay

## 2018-11-01 MED ORDER — DIGOXIN 125 MCG PO TABS: 0.1250 mg | ORAL_TABLET | Freq: Every day | ORAL | 5 refills | Status: DC

## 2018-11-01 NOTE — Telephone Encounter (Signed)
Received fax from [facility] to confirm patient [place] still on maintenance dose of Uptravi 200 mcg BID.  Pt confirmed she is on that dose and attempted to increase in the past however had jaw pain and had to decrease.  LM on vm at [place] [phone] with same information.

## 2018-11-02 ENCOUNTER — Other Ambulatory Visit (HOSPITAL_COMMUNITY): Payer: Self-pay | Admitting: Cardiology

## 2018-11-04 ENCOUNTER — Other Ambulatory Visit: Payer: Self-pay | Admitting: Cardiology

## 2018-11-04 DIAGNOSIS — I5022 Chronic systolic (congestive) heart failure: Secondary | ICD-10-CM | POA: Diagnosis not present

## 2018-11-05 ENCOUNTER — Telehealth (HOSPITAL_COMMUNITY): Payer: Self-pay | Admitting: Cardiology

## 2018-11-05 DIAGNOSIS — I5022 Chronic systolic (congestive) heart failure: Secondary | ICD-10-CM

## 2018-11-05 LAB — BASIC METABOLIC PANEL
BUN/Creatinine Ratio: 16 (ref 12–28)
BUN: 26 mg/dL (ref 8–27)
CO2: 23 mmol/L (ref 20–29)
Calcium: 10.2 mg/dL (ref 8.7–10.3)
Chloride: 94 mmol/L — ABNORMAL LOW (ref 96–106)
Creatinine, Ser: 1.62 mg/dL — ABNORMAL HIGH (ref 0.57–1.00)
GFR calc Af Amer: 36 mL/min/{1.73_m2} — ABNORMAL LOW (ref >59)
GFR calc non Af Amer: 31 mL/min/{1.73_m2} — ABNORMAL LOW (ref >59)
Glucose: 156 mg/dL — ABNORMAL HIGH (ref 65–99)
Potassium: 4.7 mmol/L (ref 3.5–5.2)
Sodium: 138 mmol/L (ref 134–144)

## 2018-11-05 MED ORDER — POTASSIUM CHLORIDE CRYS ER 20 MEQ PO TBCR: 20.0000 meq | EXTENDED_RELEASE_TABLET | Freq: Every day | ORAL | 3 refills | Status: DC

## 2018-11-05 MED ORDER — TORSEMIDE 20 MG PO TABS: ORAL_TABLET | ORAL | 5 refills | Status: DC

## 2018-11-05 NOTE — Telephone Encounter (Signed)
Abnormal lab received Labs drawn 11/04/18 K 4.7 Cr 1.62 BUN 26 Na 138  Per [name] Smith,NP Decrease torsemide to 60/40, decrease potassium to 20 meq daily Repeat labs x 7-10 days  Pt aware and voiced understanding Request to have labs repeated at [place] sent

## 2018-11-10 ENCOUNTER — Other Ambulatory Visit: Payer: Self-pay | Admitting: Family Medicine

## 2018-11-11 ENCOUNTER — Encounter (HOSPITAL_COMMUNITY): Payer: Self-pay

## 2018-11-11 ENCOUNTER — Other Ambulatory Visit (HOSPITAL_COMMUNITY): Payer: Self-pay

## 2018-11-11 NOTE — Progress Notes (Signed)
Today was a visit with Debra Saunders to refill her medication boxes.  She states feeling ok just tired all the time.  She has her appetite back, she tries to stay active in the house.  She states been sleeping later in the day.  She is aware of blood work Architectural technologist.  She states jaw pain and side effects from uptravi is getting better but does not want to increase just yet.  Meds verified and med boxes filled.  Called in refill for amlodipine. Swelling is down in her legs, smallest I have seen them.  She watches her sodium, she eats mostly Moms meals.  She watches her fluid intake.  She denies chest pain, headache and shortness of breath at rest.  Will continue to visit at her house, she is aware of staying out of the public and using precautions at this time.    [place] [phone]

## 2018-11-16 ENCOUNTER — Other Ambulatory Visit (HOSPITAL_COMMUNITY): Payer: Self-pay | Admitting: *Deleted

## 2018-11-16 DIAGNOSIS — E119 Type 2 diabetes mellitus without complications: Secondary | ICD-10-CM | POA: Diagnosis not present

## 2018-11-16 DIAGNOSIS — I5022 Chronic systolic (congestive) heart failure: Secondary | ICD-10-CM

## 2018-11-18 ENCOUNTER — Telehealth (HOSPITAL_COMMUNITY): Payer: Self-pay

## 2018-11-18 ENCOUNTER — Other Ambulatory Visit: Payer: Self-pay | Admitting: Family Medicine

## 2018-11-18 DIAGNOSIS — E118 Type 2 diabetes mellitus with unspecified complications: Secondary | ICD-10-CM

## 2018-11-18 DIAGNOSIS — Z794 Long term (current) use of insulin: Secondary | ICD-10-CM

## 2018-11-18 MED ORDER — DULAGLUTIDE 0.75 MG/0.5ML ~~LOC~~ SOAJ: SUBCUTANEOUS | 2 refills | Status: DC

## 2018-11-18 NOTE — Telephone Encounter (Signed)
Debra Saunders has went twice to get blood work done at [facility] and both times they advised her they did not have an order.  Contacted the HF clinic and they advised they have sent it and faxed the order with confirmation.  The [facility] she has been going to will not answer the phone.  Contacted customer service and spoke with a [name] and she contacted the [place] facility and she states they now have the order for her blood work.  Contacted [name] and advised, she stated she will go by Friday for the blood work. Advised her any problems when she gets there to contact me.   [place] [phone]

## 2018-11-19 ENCOUNTER — Other Ambulatory Visit: Payer: Self-pay | Admitting: Cardiology

## 2018-11-19 DIAGNOSIS — I5022 Chronic systolic (congestive) heart failure: Secondary | ICD-10-CM | POA: Diagnosis not present

## 2018-11-20 LAB — BASIC METABOLIC PANEL
BUN/Creatinine Ratio: 16 (ref 12–28)
BUN: 23 mg/dL (ref 8–27)
CO2: 25 mmol/L (ref 20–29)
Calcium: 9.1 mg/dL (ref 8.7–10.3)
Chloride: 99 mmol/L (ref 96–106)
Creatinine, Ser: 1.4 mg/dL — ABNORMAL HIGH (ref 0.57–1.00)
GFR calc Af Amer: 42 mL/min/{1.73_m2} — ABNORMAL LOW (ref >59)
GFR calc non Af Amer: 37 mL/min/{1.73_m2} — ABNORMAL LOW (ref >59)
Glucose: 231 mg/dL — ABNORMAL HIGH (ref 65–99)
Potassium: 4.5 mmol/L (ref 3.5–5.2)
Sodium: 142 mmol/L (ref 134–144)

## 2018-11-23 ENCOUNTER — Other Ambulatory Visit: Payer: Self-pay | Admitting: Family Medicine

## 2018-11-23 NOTE — Telephone Encounter (Signed)
Copied from [place] [id number]. Topic: Quick Communication - Rx Refill/Question >> Nov 23, 2018 11:04 AM [name] wrote: Medication: [place]   Preferred Pharmacy (with phone number or street name)[facility] verbal orders [phone]   Agent: Please be advised that RX refills may take up to 3 business days. We ask that you follow-up with your pharmacy.

## 2018-11-23 NOTE — Telephone Encounter (Signed)
Called patient she says she gets this script filled at [facility] and that she has not called the office.

## 2018-11-24 ENCOUNTER — Telehealth: Payer: Self-pay

## 2018-11-24 NOTE — Telephone Encounter (Signed)
Virtual Visit Pre-Appointment Phone Call  "Evelyna, I am calling you today to discuss your upcoming appointment. We are currently trying to limit exposure to the virus that causes COVID-19 by seeing patients at home rather than in the office."  1. "What is the BEST phone number to call the day of the visit?" - include this in appointment notes  2. Do you have or have access to (through a family member/friend) a smartphone with video capability that we can use for your visit?" a. If yes - list this number in appt notes as cell (if different from BEST phone #) and list the appointment type as a VIDEO visit in appointment notes b. If no - list the appointment type as a PHONE visit in appointment notes  3. Confirm consent - "In the setting of the current Covid19 crisis, you are scheduled for a phone visit with your provider on 01/06/2019 at 2:00PM.  Just as we do with many in-office visits, in order for you to participate in this visit, we must obtain consent.  If you'd like, I can send this to your mychart (if signed up) or email for you to review.  Otherwise, I can obtain your verbal consent now.  All virtual visits are billed to your insurance company just like a normal visit would be.  By agreeing to a virtual visit, we'd like you to understand that the technology does not allow for your provider to perform an examination, and thus may limit your provider's ability to fully assess your condition. If your provider identifies any concerns that need to be evaluated in person, we will make arrangements to do so.  Finally, though the technology is pretty good, we cannot assure that it will always work on either your or our end, and in the setting of a video visit, we may have to convert it to a phone-only visit.  In either situation, we cannot ensure that we have a secure connection.  Are you willing to proceed?" STAFF: Did the patient verbally acknowledge consent to telehealth visit? Document YES/NO  here: YES  4. Advise patient to be prepared - "Two hours prior to your appointment, go ahead and check your blood pressure, pulse, oxygen saturation, and your weight (if you have the equipment to check those) and write them all down. When your visit starts, your provider will ask you for this information. If you have an Apple Watch or Kardia device, please plan to have heart rate information ready on the day of your appointment. Please have a pen and paper handy nearby the day of the visit as well."  5. Give patient instructions for MyChart download to smartphone OR [email or web address] as below if video visit (depending on what platform provider is using)  6. Inform patient they will receive a phone call 15 minutes prior to their appointment time (may be from unknown caller ID) so they should be prepared to answer    TELEPHONE CALL NOTE  Debra Saunders has been deemed a candidate for a follow-up tele-health visit to limit community exposure during the Covid-19 pandemic. I spoke with the patient via phone to ensure availability of phone/video source, confirm preferred email & phone number, and discuss instructions and expectations.  I reminded KIMIKO COMMON to be prepared with any vital sign and/or heart rhythm information that could potentially be obtained via home monitoring, at the time of her visit. I reminded QUINLAN MCFALL to expect a phone call prior to her visit.  Horton Finer 11/24/2018 3:54 PM    FULL LENGTH CONSENT FOR TELE-HEALTH VISIT   I hereby voluntarily request, consent and authorize [facility] and its employed or contracted physicians, physician assistants, nurse practitioners or other licensed health care professionals (the Practitioner), to provide me with telemedicine health care services (the Services") as deemed necessary by the treating Practitioner. I acknowledge and consent to receive the Services by the Practitioner via telemedicine. I understand  that the telemedicine visit will involve communicating with the Practitioner through live audiovisual communication technology and the disclosure of certain medical information by electronic transmission. I acknowledge that I have been given the opportunity to request an in-person assessment or other available alternative prior to the telemedicine visit and am voluntarily participating in the telemedicine visit.  I understand that I have the right to withhold or withdraw my consent to the use of telemedicine in the course of my care at any time, without affecting my right to future care or treatment, and that the Practitioner or I may terminate the telemedicine visit at any time. I understand that I have the right to inspect all information obtained and/or recorded in the course of the telemedicine visit and may receive copies of available information for a reasonable fee.  I understand that some of the potential risks of receiving the Services via telemedicine include:   Delay or interruption in medical evaluation due to technological equipment failure or disruption;  Information transmitted may not be sufficient (e.g. poor resolution of images) to allow for appropriate medical decision making by the Practitioner; and/or   In rare instances, security protocols could fail, causing a breach of personal health information.  Furthermore, I acknowledge that it is my responsibility to provide information about my medical history, conditions and care that is complete and accurate to the best of my ability. I acknowledge that Practitioner's advice, recommendations, and/or decision may be based on factors not within their control, such as incomplete or inaccurate data provided by me or distortions of diagnostic images or specimens that may result from electronic transmissions. I understand that the practice of medicine is not an exact science and that Practitioner makes no warranties or guarantees regarding  treatment outcomes. I acknowledge that I will receive a copy of this consent concurrently upon execution via email to the email address I last provided but may also request a printed copy by calling the office of [place].    I understand that my insurance will be billed for this visit.   I have read or had this consent read to me.  I understand the contents of this consent, which adequately explains the benefits and risks of the Services being provided via telemedicine.   I have been provided ample opportunity to ask questions regarding this consent and the Services and have had my questions answered to my satisfaction.  I give my informed consent for the services to be provided through the use of telemedicine in my medical care  By participating in this telemedicine visit I agree to the above.

## 2018-11-25 ENCOUNTER — Other Ambulatory Visit (HOSPITAL_COMMUNITY): Payer: Self-pay | Admitting: Cardiology

## 2018-11-25 ENCOUNTER — Other Ambulatory Visit (HOSPITAL_COMMUNITY): Payer: Self-pay

## 2018-11-25 ENCOUNTER — Encounter (HOSPITAL_COMMUNITY): Payer: Self-pay

## 2018-11-25 NOTE — Progress Notes (Signed)
Today was a home visit with Debra Saunders.  She states she is doing better with the increase of uptravi.  We have went up to 400 mcg x2 daily now.  She states side effects are not as bad as they were.  Her appetite is coming back.  She states her weight is coming down and her bp is coming down.  Her breathing is good at rest, still short winded with activity.  She has pulled her back moving boxes today but other than that she feels pretty good today.  Swelling down in her right leg, she has has edema in left leg from stroke.  Lungs are clear.  Denies chest pain, headaches or dizziness.  Sleeping good and taking naps in day time, she states she tires easily.  She tries to watch her sodium and carbs.  She watches the fluid she drinks.  She still eating the Moms meals.  Meds verified and med boxes refilled. Called in [place] and digoxin to pharmacy.  Will continue to visit every other week for medications, heart failure and diet.   [place] [phone]

## 2018-11-26 ENCOUNTER — Other Ambulatory Visit: Payer: Self-pay | Admitting: *Deleted

## 2018-11-26 DIAGNOSIS — E118 Type 2 diabetes mellitus with unspecified complications: Secondary | ICD-10-CM

## 2018-11-26 DIAGNOSIS — Z794 Long term (current) use of insulin: Secondary | ICD-10-CM

## 2018-11-26 MED ORDER — DULAGLUTIDE 0.75 MG/0.5ML ~~LOC~~ SOAJ: SUBCUTANEOUS | 2 refills | Status: DC

## 2018-11-26 NOTE — Progress Notes (Signed)
Completed script for PCP sig, to send to [facility].

## 2018-11-29 ENCOUNTER — Ambulatory Visit (INDEPENDENT_AMBULATORY_CARE_PROVIDER_SITE_OTHER): Payer: Medicare HMO | Admitting: *Deleted

## 2018-11-29 DIAGNOSIS — I495 Sick sinus syndrome: Secondary | ICD-10-CM

## 2018-11-29 DIAGNOSIS — I5022 Chronic systolic (congestive) heart failure: Secondary | ICD-10-CM

## 2018-11-29 LAB — CUP PACEART REMOTE DEVICE CHECK
Battery Remaining Longevity: 108 mo
Battery Remaining Percentage: 100 %
Brady Statistic RA Percent Paced: 72 %
Brady Statistic RV Percent Paced: 46 %
Date Time Interrogation Session: 20200608072100
Implantable Lead Implant Date: 20150720
Implantable Lead Implant Date: 20150720
Implantable Lead Location: 753859
Implantable Lead Location: 753860
Implantable Lead Model: 5076
Implantable Lead Model: 5076
Implantable Pulse Generator Implant Date: 20150720
Lead Channel Impedance Value: 378 Ohm
Lead Channel Impedance Value: 497 Ohm
Lead Channel Pacing Threshold Amplitude: 0.7 V
Lead Channel Pacing Threshold Pulse Width: 0.4 ms
Lead Channel Setting Pacing Amplitude: 2 V
Lead Channel Setting Pacing Amplitude: 2.4 V
Lead Channel Setting Pacing Pulse Width: 0.4 ms
Lead Channel Setting Sensing Sensitivity: 2.5 mV
Pulse Gen Serial Number: 390330

## 2018-11-30 ENCOUNTER — Telehealth (HOSPITAL_COMMUNITY): Payer: Self-pay | Admitting: *Deleted

## 2018-11-30 NOTE — Telephone Encounter (Signed)
[facility] left VM on triage line stating they are changing the manufacturer they get their digoxin from and want to make sure it is ok with [place] to change.  Advised ok, pt has app on 7/2 and we will check lab work at that time to f/u.

## 2018-12-06 ENCOUNTER — Telehealth (HOSPITAL_COMMUNITY): Payer: Self-pay

## 2018-12-06 NOTE — Telephone Encounter (Signed)
New PA required for Uptravi per [facility] for med quantity as Debra Saunders is a titratable med.  New PA submitted via CMM. Awaiting response.

## 2018-12-06 NOTE — Progress Notes (Signed)
Remote pacemaker transmission.   

## 2018-12-08 ENCOUNTER — Ambulatory Visit: Payer: Medicare HMO | Admitting: Pulmonary Disease

## 2018-12-09 ENCOUNTER — Other Ambulatory Visit: Payer: Self-pay | Admitting: Family Medicine

## 2018-12-09 ENCOUNTER — Encounter (HOSPITAL_COMMUNITY): Payer: Self-pay

## 2018-12-09 ENCOUNTER — Other Ambulatory Visit (HOSPITAL_COMMUNITY): Payer: Self-pay

## 2018-12-09 DIAGNOSIS — E785 Hyperlipidemia, unspecified: Secondary | ICD-10-CM

## 2018-12-09 NOTE — Progress Notes (Signed)
Today was a visit at home with Debra Saunders.  She states doing ok, she states gets tired easily.  She states she tries to stay active in the house.  She denies chest pain, shortness of breath at rest, headaches or dizziness.  She has no swelling in her r leg, she has swelling in left leg, ankle and foot.  She states she has been to vascular and they state there is nothing they can do.  She has all her medications, filled med boxes up.  Called in refills for gabapentin, rosuvastatin, eliquis, torsemide and uptravi. [name] stated they got the confirmation to fill, will be delivered Saturday for 1 month supply.  She states been eating better, appetite has been better.  She states doing ok this time on up dose of uptravi.  Will continue to visit for heart failure and medication compliance.  [place] [phone]

## 2018-12-09 NOTE — Telephone Encounter (Signed)
Prior authorization through [place] was APPROVED for Debra Saunders and will expire on 06/23/2019.

## 2018-12-13 ENCOUNTER — Telehealth (HOSPITAL_COMMUNITY): Payer: Self-pay

## 2018-12-13 NOTE — Telephone Encounter (Signed)
Was contacted by [name] stating [name] was not delivered Saturday like that had advised.  Contacted [facility] and he advised been leaving messages for Ioma to contact them and she has not called back.  He states there was a problem with insurance last week but now is fine.  He will have it overnight for Tuesday (tomorrow) for delivery for 30 day supply.    [place] [phone]

## 2018-12-14 ENCOUNTER — Telehealth (HOSPITAL_COMMUNITY): Payer: Self-pay

## 2018-12-14 NOTE — Telephone Encounter (Signed)
Debra Saunders did state she received Debra Saunders this am for 1 month supply.   [place] [phone]

## 2018-12-15 ENCOUNTER — Other Ambulatory Visit (HOSPITAL_COMMUNITY): Payer: Self-pay

## 2018-12-15 NOTE — Telephone Encounter (Signed)
Updated Rx for Uptravi faxed to [name].  Dosage to be increased by 232mcg BID every 1-4 week as tolerated to maximum dose of 1610mcg BID. Pharmacist will call patient with each dose change and assessment until maintenance is achieved.

## 2018-12-16 ENCOUNTER — Telehealth (HOSPITAL_COMMUNITY): Payer: Self-pay

## 2018-12-16 NOTE — Telephone Encounter (Signed)
PA submitted via fax for Uptravi for 265mcg tab and 816mcg as patient titrating up weekly.

## 2018-12-20 NOTE — Telephone Encounter (Signed)
Prior authorization through [place] was APPROVED for Uptravi 245mcg and will expire on 06/23/2019.

## 2018-12-21 ENCOUNTER — Telehealth (HOSPITAL_COMMUNITY): Payer: Self-pay | Admitting: Pharmacy Technician

## 2018-12-21 ENCOUNTER — Telehealth (HOSPITAL_COMMUNITY): Payer: Self-pay | Admitting: Licensed Clinical Social Worker

## 2018-12-21 NOTE — Telephone Encounter (Signed)
Pt enrolled in Moms Meals 3 month study through [facility]- patient's 3 month period has now ended so CSW spoke with pt to complete an exit interview regarding patient's experience:  1. Since being on Moms Meals have you noticed any changes in your health or the way you feel physically? no Weight loss? Lost 12lbs over the course of the program Improvement in BP? States that her BP is now in the 130s where the top number had been in the 160s and 170s- also states her MD increased her BP med during this time.  2. What do you like about the program? Liked that she didn't have to worry about what she was cooking. What would you like to change/add?  Stated that the box was very cumbersome that the meals came in so she would have to call someone to come over and help her.  3. How did you get food prior to [place]? a. Did you get food stamps? no b. Did someone help financially? no c. Did you access food banks? no d. Do you anticipate any issues transitioning back to getting your own food after this program ends? No- continues to get Meals on Wheels   4. Would you be interested in future food pilots? yes  5. Would you like a dietary consult? yes  6. Do you have internet access? Tablet/Smart Phone? Yes has a computer   Jorge Ny, [place] Worker [facility] Desk#: [phone] Cell#: [phone]

## 2018-12-21 NOTE — Telephone Encounter (Signed)
Heart Failure Patient Advocate Encounter  Prior Authorization for Debra Saunders has been approved.    PA# none Effective dates: 12/21/2018 through 06/23/2019  Jodelle Gross (CPhT) [place] Patient Advocate [phone] 12/21/2018 10:20 AM

## 2018-12-22 ENCOUNTER — Encounter (HOSPITAL_COMMUNITY): Payer: Self-pay

## 2018-12-22 ENCOUNTER — Ambulatory Visit: Payer: Medicare HMO | Admitting: Pulmonary Disease

## 2018-12-22 ENCOUNTER — Other Ambulatory Visit (HOSPITAL_COMMUNITY): Payer: Self-pay

## 2018-12-22 ENCOUNTER — Other Ambulatory Visit: Payer: Self-pay | Admitting: Family Medicine

## 2018-12-22 NOTE — Telephone Encounter (Signed)
Called patient to schedule a telephone or virtual visit and pt states she cannot schedule today will call back to schedule.  She is requesting metformin and you did not write it and its in historical but she states she had a 6 month supply and went into hospital and you took over the provider she use to have here  Debra Saunders,cma

## 2018-12-22 NOTE — Progress Notes (Signed)
Today Blakley states doing ok, she states pulled her back last week lifting up a box.  She states talked with her PCP and he advised to rest and use ice.  She states getting a little better.  She states her weight is coming down, she is not eating much, she states has no appetite.  Advised her she needs to eat especially with her meds.  She has taken her meds but not ate today.  Meds verified and meds placed in med boxes.  Called in refill for rosuvastatin and spironolactone.  She denies headaches, shortness of breath at rest, dizziness or chest pain.  She is aware of upcoming appts with cardioloogy.  She has no swelling in her right leg, she always has swelling in left leg and foot due to poor circulation.  She states not sleeping as late past 2 weeks, not as many naps during the day.  Will continue to visit for heart failure and medication compliance.   [place] [phone]

## 2018-12-23 ENCOUNTER — Encounter (HOSPITAL_COMMUNITY): Payer: Medicare HMO | Admitting: Cardiology

## 2018-12-23 NOTE — Telephone Encounter (Signed)
Given her recent kidney function she should have her dose decreased to 500 mg twice daily. She needs to be seen for follow-up as well to have an A1c completed.

## 2019-01-06 ENCOUNTER — Ambulatory Visit: Payer: Medicare HMO | Admitting: Cardiovascular Disease

## 2019-01-06 ENCOUNTER — Other Ambulatory Visit: Payer: Self-pay | Admitting: Family Medicine

## 2019-01-06 ENCOUNTER — Other Ambulatory Visit (HOSPITAL_COMMUNITY): Payer: Self-pay

## 2019-01-10 ENCOUNTER — Other Ambulatory Visit (HOSPITAL_COMMUNITY): Payer: Self-pay

## 2019-01-10 MED ORDER — UPTRAVI 200 MCG PO TABS: 200.0000 ug | ORAL_TABLET | Freq: Two times a day (BID) | ORAL | 3 refills | Status: DC

## 2019-01-20 ENCOUNTER — Ambulatory Visit (HOSPITAL_COMMUNITY)
Admission: RE | Admit: 2019-01-20 | Discharge: 2019-01-20 | Disposition: A | Discharge status: Home / Self Care | Payer: Medicare HMO | Source: Ambulatory Visit | Attending: Cardiology | Admitting: Cardiology

## 2019-01-20 ENCOUNTER — Other Ambulatory Visit: Payer: Self-pay

## 2019-01-20 ENCOUNTER — Encounter (HOSPITAL_COMMUNITY): Payer: Self-pay | Admitting: Cardiology

## 2019-01-20 VITALS — BP 120/62 | HR 60 | Wt 201.6 lb

## 2019-01-20 DIAGNOSIS — I5022 Chronic systolic (congestive) heart failure: Secondary | ICD-10-CM

## 2019-01-20 DIAGNOSIS — Z8673 Personal history of transient ischemic attack (TIA), and cerebral infarction without residual deficits: Secondary | ICD-10-CM | POA: Diagnosis not present

## 2019-01-20 DIAGNOSIS — R001 Bradycardia, unspecified: Secondary | ICD-10-CM | POA: Diagnosis not present

## 2019-01-20 DIAGNOSIS — J8410 Pulmonary fibrosis, unspecified: Secondary | ICD-10-CM | POA: Insufficient documentation

## 2019-01-20 DIAGNOSIS — Z880 Allergy status to penicillin: Secondary | ICD-10-CM | POA: Insufficient documentation

## 2019-01-20 DIAGNOSIS — J449 Chronic obstructive pulmonary disease, unspecified: Secondary | ICD-10-CM | POA: Insufficient documentation

## 2019-01-20 DIAGNOSIS — Z7984 Long term (current) use of oral hypoglycemic drugs: Secondary | ICD-10-CM | POA: Insufficient documentation

## 2019-01-20 DIAGNOSIS — I2510 Atherosclerotic heart disease of native coronary artery without angina pectoris: Secondary | ICD-10-CM | POA: Insufficient documentation

## 2019-01-20 DIAGNOSIS — Z7901 Long term (current) use of anticoagulants: Secondary | ICD-10-CM | POA: Diagnosis not present

## 2019-01-20 DIAGNOSIS — Z8249 Family history of ischemic heart disease and other diseases of the circulatory system: Secondary | ICD-10-CM | POA: Insufficient documentation

## 2019-01-20 DIAGNOSIS — I890 Lymphedema, not elsewhere classified: Secondary | ICD-10-CM | POA: Diagnosis not present

## 2019-01-20 DIAGNOSIS — I130 Hypertensive heart and chronic kidney disease with heart failure and stage 1 through stage 4 chronic kidney disease, or unspecified chronic kidney disease: Secondary | ICD-10-CM | POA: Insufficient documentation

## 2019-01-20 DIAGNOSIS — E1122 Type 2 diabetes mellitus with diabetic chronic kidney disease: Secondary | ICD-10-CM | POA: Insufficient documentation

## 2019-01-20 DIAGNOSIS — I272 Pulmonary hypertension, unspecified: Secondary | ICD-10-CM

## 2019-01-20 DIAGNOSIS — Z86718 Personal history of other venous thrombosis and embolism: Secondary | ICD-10-CM | POA: Diagnosis not present

## 2019-01-20 DIAGNOSIS — Z886 Allergy status to analgesic agent status: Secondary | ICD-10-CM | POA: Diagnosis not present

## 2019-01-20 DIAGNOSIS — N183 Chronic kidney disease, stage 3 (moderate): Secondary | ICD-10-CM | POA: Diagnosis not present

## 2019-01-20 DIAGNOSIS — I5033 Acute on chronic diastolic (congestive) heart failure: Secondary | ICD-10-CM | POA: Insufficient documentation

## 2019-01-20 DIAGNOSIS — G4733 Obstructive sleep apnea (adult) (pediatric): Secondary | ICD-10-CM | POA: Insufficient documentation

## 2019-01-20 DIAGNOSIS — I480 Paroxysmal atrial fibrillation: Secondary | ICD-10-CM

## 2019-01-20 DIAGNOSIS — J9611 Chronic respiratory failure with hypoxia: Secondary | ICD-10-CM | POA: Insufficient documentation

## 2019-01-20 DIAGNOSIS — Z950 Presence of cardiac pacemaker: Secondary | ICD-10-CM | POA: Insufficient documentation

## 2019-01-20 DIAGNOSIS — Z79899 Other long term (current) drug therapy: Secondary | ICD-10-CM | POA: Insufficient documentation

## 2019-01-20 DIAGNOSIS — Z87891 Personal history of nicotine dependence: Secondary | ICD-10-CM | POA: Insufficient documentation

## 2019-01-20 LAB — CBC
HCT: 31 % — ABNORMAL LOW (ref 36.0–46.0)
Hemoglobin: 9 g/dL — ABNORMAL LOW (ref 12.0–15.0)
MCH: 25.3 pg — ABNORMAL LOW (ref 26.0–34.0)
MCHC: 29 g/dL — ABNORMAL LOW (ref 30.0–36.0)
MCV: 87.1 fL (ref 80.0–100.0)
Platelets: 235 10*3/uL (ref 150–400)
RBC: 3.56 MIL/uL — ABNORMAL LOW (ref 3.87–5.11)
RDW: 14.1 % (ref 11.5–15.5)
WBC: 8.1 10*3/uL (ref 4.0–10.5)
nRBC: 0 % (ref 0.0–0.2)

## 2019-01-20 LAB — BASIC METABOLIC PANEL
Anion gap: 11 (ref 5–15)
BUN: 13 mg/dL (ref 8–23)
CO2: 24 mmol/L (ref 22–32)
Calcium: 9.1 mg/dL (ref 8.9–10.3)
Chloride: 102 mmol/L (ref 98–111)
Creatinine, Ser: 1.28 mg/dL — ABNORMAL HIGH (ref 0.44–1.00)
GFR calc Af Amer: 47 mL/min — ABNORMAL LOW (ref >60)
GFR calc non Af Amer: 41 mL/min — ABNORMAL LOW (ref >60)
Glucose, Bld: 128 mg/dL — ABNORMAL HIGH (ref 70–99)
Potassium: 3.7 mmol/L (ref 3.5–5.1)
Sodium: 137 mmol/L (ref 135–145)

## 2019-01-20 LAB — DIGOXIN LEVEL: Digoxin Level: 0.4 ng/mL — ABNORMAL LOW (ref 0.8–2.0)

## 2019-01-20 NOTE — Patient Instructions (Addendum)
Labs were done today. We will call you with any ABNORMAL results. No news is good news!  EKG was completed.   6 minute walk was completed.   Your physician has requested that you have an echocardiogram. Echocardiography is a painless test that uses sound waves to create images of your heart. It provides your doctor with information about the size and shape of your heart and how well your heart's chambers and valves are working. This procedure takes approximately one hour. There are no restrictions for this procedure.  Your physician recommends that you schedule a follow-up appointment in: 3 months.   At the [facility], you and your health needs are our priority. As part of our continuing mission to provide you with exceptional heart care, we have created designated Provider Care Teams. These Care Teams include your primary Cardiologist (physician) and Advanced Practice Providers (APPs- Physician Assistants and Nurse Practitioners) who all work together to provide you with the care you need, when you need it.   You may see any of the following providers on your designated Care Team at your next follow up: [name] Dr Glori Bickers . Dr [name] . Darrick Grinder, NP

## 2019-01-20 NOTE — Progress Notes (Signed)
6MW  700 meters completed in 6 minutes  O2 sat anf HR stayed stable during duration of walk  97-98% on 4L n/c  HR 60-84

## 2019-01-20 NOTE — Progress Notes (Signed)
Date:  01/20/2019   ID:  DEMITRIA HAY, DOB 12/10/1945, MRN [id number]   Provider location: [facility] Type of Visit: Established patient   PCP:  [name], MD  Cardiologist:  [name], MD Primary HF: Dr. [name]  Chief Complaint: Shortness of breath   History of Present Illness: Debra Saunders is a 73 y.o. 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  Admitted 10/24-10/29/19 with acute on chronic diastolic HF. HF team consulted with concerns for low output. PICC was placed and milrinone to support RV function was started along with IV Lasix to facilitate diuresis. RHC done to evaluate PAH (see below). V/Q scan negative. High res CT showed pulmonary fibrosis. Pulmonary consult and recommended stopping amiodarone. She was transitioned to torsemide 60 mg BID. DC weight: 226 lbs.   She has started on selexipag for pulmonary hypertension in addition to Opsumit and Revatio.  Due to side effects, she has slowly increased this to 400 mcg bid.    She returns for followup of pulmonary hypertension and diastolic CHF.  She is not in atrial fibrillation (a-paced).  She is short of breath doing housework and the heat makes her breathing worse. No dyspnea walking around her house.  Significant fatigue.  No palpitations.  No chest pain. No lightheadedness.  Her 6 minute walk today was improved.   ECG (personally reviewed): a-paced, PVCs, nonspecific T wave flattening.   6 minute walk (1/20): 98 m 6 minute walk (7/20): 213 m  Labs (11/19): digoxin 0.8 Labs (1/20): K 4.2, creatinine 1.36 => 1.33, digoxin 0.8 Labs (2/20): digoxin 0.2, K 4.3,  creatinine 1.32 Labs (5/20): K 4.5, creatinine 1.4, digoxin 0.8  PMH: 1. Lymphedema: Left lower leg (chronic).  2. COPD 3. 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  - PFTs (2017) with FEV1 64%, FEV1/FVC ratio 106%, TLC 49%, DLCO 39% (restrictive).  - CT chest high resolution (10/19): Pulmonary parenchymal pattern of fibrosis.  4. Atrial fibrillation: Paroxysmal.  She is off amiodarone due to lung disease.  - 2/19 DCCV.  5. H/o DVT s/p IVC filter in 2004.  6. CAD: 4/19 coronary angiography with minimal coronary disease.  7. Bradycardia: [employer] PPM.  8. H/o traumatic SAH 2014.  9. CKD: Stage 3.  10. H/o CVA 11. OSA: CPAP 12. HTN 13. Type II diabetes 14. Pulmonary hypertension:  - PFTs 2017 with severe restriction.  - Echo (10/19): EF 60-65%, PASP 52 mmHg - RHC (4/19): Severe PAH with PVR 7.2 WU.  - RHC (10/19): mean RA 6, PA 56/17 mean 33, PCWP mean 12, CI 3.23, PVR 3.4 WU.  - V/Q scan (10/19): No chronic PE.  - Anti-SCL 70 and ANA negative.  ACE normal.  RF slightly elevated at 15.  FH: mother with CHF 35. 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   Current Outpatient Medications  Medication Sig Dispense Refill  . Accu-Chek Softclix Lancets lancets TEST UP TO FOUR TIMES DAILY AS DIRECTED 100 each 12  . acetaminophen (TYLENOL) 325 MG tablet Take 325-650 mg by mouth every 6 (six) hours as needed for mild pain. Reported on 10/10/2015    . albuterol (PROVENTIL) (2.5 MG/3ML) 0.083% nebulizer solution Take 3 mLs (2.5  mg total) by nebulization every 6 (six) hours. 75 mL 12  . amLODipine (NORVASC) 10 MG tablet Take 1 tablet (10 mg total) by mouth daily. 30 tablet 6  . apixaban (ELIQUIS) 5 MG TABS tablet Take 1 tablet (5 mg total) by mouth 2 (two) times daily. 60 tablet 11  . Blood Glucose Monitoring Suppl (ACCU-CHEK AVIVA PLUS) w/Device KIT USE AS DIRECTED 1 kit 0  . cyanocobalamin 1000 MCG tablet Take 1,000 mcg by mouth daily. Daily in am    .  digoxin (LANOXIN) 0.125 MG tablet Take 1 tablet (0.125 mg total) by mouth daily. 30 tablet 5  . Dulaglutide ([name]) 6.38 GT/3.6IW SOPN INJECT 0.75MG SUBCUTANEOUSLY ONCE A WEEK 4 pen 2  . gabapentin (NEURONTIN) 100 MG capsule TAKE 2 CAPSULES BY MOUTH THREE TIMES DAILY 180 capsule 0  . glucose blood (TRUE METRIX BLOOD GLUCOSE TEST) test strip Check blood glucose twice daily Dx:E11.8, Z79.4 100 each 12  . glucose blood test strip Use as instructed 100 each 12  . macitentan (OPSUMIT) 10 MG tablet Take 1 tablet (10 mg total) by mouth daily. 30 tablet 3  . meclizine (ANTIVERT) 25 MG tablet Take 1 tablet (25 mg total) by mouth 3 (three) times daily as needed for dizziness. 30 tablet 0  . Menthol, Topical Analgesic, (BIOFREEZE EX) Apply 1 application topically as needed (for pain).     . metFORMIN (GLUCOPHAGE) 500 MG tablet Take 1 tablet (500 mg total) by mouth 2 (two) times daily with a meal. 180 tablet 1  . ondansetron (ZOFRAN) 8 MG tablet Take 1 tablet (8 mg total) by mouth every 8 (eight) hours as needed for nausea or vomiting. 30 tablet 2  . OXYGEN Inhale 4 L into the lungs continuous.    . potassium chloride SA (K-DUR) 20 MEQ tablet Take 1 tablet (20 mEq total) by mouth daily. 180 tablet 3  . rosuvastatin (CRESTOR) 10 MG tablet Take 1 tablet by mouth once daily 90 tablet 0  . Selexipag (UPTRAVI) 200 MCG TABS Take 1 tablet (200 mcg total) by mouth 2 (two) times daily. (Patient taking differently: Take 400 mcg by mouth 2 (two) times daily. Going up to 3tabs on aug 1st 2020) 60 tablet 3  . sildenafil (REVATIO) 20 MG tablet Take 20 mg by mouth 3 (three) times daily.    [name] spironolactone (ALDACTONE) 25 MG tablet Take 1 tablet (25 mg total) by mouth at bedtime. 30 tablet 6  . torsemide (DEMADEX) 20 MG tablet Take 3 tablets (60 mg total) by mouth every morning AND 2 tablets (40 mg total) every evening. 180 tablet 5   No current facility-administered medications for this encounter.     Allergies:    Aspirin, Other, and Penicillins   Social History:  The patient  reports that she quit smoking about 28 years ago. Her smoking use included cigarettes. She has a 0.60 pack-year smoking history. She has never used smokeless tobacco. She reports that she does not drink alcohol or use drugs.   Family History:  The patient's family history includes Breast cancer in her maternal aunt; COPD in her mother; Heart disease in her mother; Hypertension in her father, mother, sister, and sister; Thyroid disease in her mother, sister, and sister.   ROS:  Please see the history of present illness.   All other systems are personally reviewed and negative.   Exam:   General: NAD Neck: No JVD, no thyromegaly or thyroid nodule.  Lungs: Slight crackles at bases bilaterally.  CV: Nondisplaced PMI.  Heart regular S1/S2, no S3/S4, no murmur.  No right ankle edema, chronic left lower leg lymphedema.  No carotid bruit.  Normal pedal pulses.  Abdomen: Soft, nontender, no hepatosplenomegaly, no distention.  Skin: Intact without lesions or rashes.  Neurologic: Alert and oriented x 3.  Psych: Normal affect. Extremities: No clubbing or cyanosis.  HEENT: Normal.   Recent Labs: 04/17/2018: TSH 0.808 04/19/2018: ALT 13 05/18/2018: B Natriuretic Peptide 65.0 10/22/2018: Magnesium 1.6 01/20/2019: BUN 13; Creatinine, Ser 1.28; Hemoglobin 9.0; Platelets 235; Potassium 3.7; Sodium 137  Personally reviewed   Wt Readings from Last 3 Encounters:  01/20/19 91.4 kg (201 lb 9.6 oz)  12/22/18 91.2 kg (201 lb)  12/09/18 92.5 kg (204 lb)    ASSESSMENT AND PLAN:  1. 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 Echo 10/19 showed EF 60-65% with moderate LVH, RV was difficult to see. During admission in 10/19, she required milrinone to support RV.  NYHA class III symptoms, likely due primarily to lung disease and pulmonary hypertension. Weight is down 19 lbs since  last appointment in the office.     -Continuetorsemide 60 mg qam/40 mg qpm.  BMET today.   2. Pulmonary hypertension: She was followed in the past at [facility] Based on 4/19 cath, she had severe PAH with PVR 7.2 WU. She has been on sildenafil 20 mg tid x 4 years. Chronic hypoxemic respiratory failure, on home oxygen. She has a history of remote DVT (2004) but V/Q scan negative for evidence of chronic PE. High resolution CT chest in 10/19 showed pulmonary fibrosis, and last PFTs in 2017 showed severe restriction.  Serological workup for rheumatological diseases was unremarkable.  There has been concern for possible burnt-out sarcoidosis as cause of her interstitial fibrosis and hilar adenopathy. She has family history of this. Amiodarone was stopped given lung parenchymal disease. 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 Repeat RHC in 10/19 showedmoderate pulmonary hypertension, PVR 3.4 WU which is significantly lower than on the 4/19 cath. She remains limited with NYHA class III symptoms, but her 6 minute walk was significantly improved today. - Continue sildenafil and Opsumit.   - She is on selexipag 400 mcg bid, she will increase it to 600 mcg bid tomorrow. Continue slow titration (moving slowly due to medication side effects).  - Continue digoxin for RV support, check digoxin level today.  - Continue CPAP and home oxygen.  - Continue followup with pulmonary for ILD, ?sarcoidosis.  - Repeat echo at followup in 3 months.  3. CKD stage 3: BMET today.  4. Atrial fibrillation: Paroxysmal.  Off amiodarone with parenchymal lung disease. No palpitations.  She is a-paced today.  -Continue apixaban 5 mg bid.  5. OSA: Continue CPAP. 6. HTN: BP controlled on higher dose of amlodipine.  7. 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      Followup 3 months with echo.   Signed, [name], MD  01/20/2019  [facility] [address] [facility] and [place] [address] [phone] (office) [phone] (fax)

## 2019-01-24 NOTE — Progress Notes (Signed)
Today was a home visit with [facility] to refill her med boxes.  She denies chest pain, shortness of breath at rest, headaches or dizziness.  Her weight is going down.  She has no swelling from CHF.  She states trying to be more active around the house.  She has been taking all her medications.  Meds verified and med box refilled.  Will continue to visit for heart failure.   [place] [employer] EMT-Paramedic [phone]

## 2019-01-27 ENCOUNTER — Other Ambulatory Visit (HOSPITAL_COMMUNITY): Payer: Self-pay

## 2019-01-28 ENCOUNTER — Encounter (HOSPITAL_COMMUNITY): Payer: Self-pay

## 2019-01-28 NOTE — Progress Notes (Signed)
Today was a visit at home.  Debra Saunders states she is having pain in her right side at times.  She states been going on for 2 weeks, not sure if kidney pain or GI pain.  She states she does not have gall bladder or appendix.  She thought might have been dehydrated so she has increased her fluids to 64 oz a day.  Appetite is ok, she does not eat but 2 meals a day.  She states feels ok otherwise.  She is still losing weight.  She has all her medications and meds were verified.  Refilled her medications boxes.  Did advise her to get appt with her PCP, she states she is due for a check up also.  Contacted Dr [name] and made her appt for next week.  She denies chest pain, shortness of breath at rest, headaches or dizziness.  She has went up on her uptravi.  No swelling in her extremities and she does not feel tight in her abdomen.  Will continue to visit for heart failure, medication compliance and diet.   [place] [phone]

## 2019-02-01 ENCOUNTER — Other Ambulatory Visit: Payer: Self-pay

## 2019-02-03 ENCOUNTER — Ambulatory Visit (INDEPENDENT_AMBULATORY_CARE_PROVIDER_SITE_OTHER): Payer: Medicare HMO | Admitting: Family Medicine

## 2019-02-03 ENCOUNTER — Encounter: Payer: Self-pay | Admitting: Family Medicine

## 2019-02-03 ENCOUNTER — Ambulatory Visit (INDEPENDENT_AMBULATORY_CARE_PROVIDER_SITE_OTHER): Payer: Medicare HMO

## 2019-02-03 ENCOUNTER — Other Ambulatory Visit: Payer: Self-pay

## 2019-02-03 VITALS — BP 122/64 | HR 61 | Temp 97.5°F | Resp 20 | Ht 60.0 in | Wt 197.2 lb

## 2019-02-03 DIAGNOSIS — I480 Paroxysmal atrial fibrillation: Secondary | ICD-10-CM

## 2019-02-03 DIAGNOSIS — I5022 Chronic systolic (congestive) heart failure: Secondary | ICD-10-CM

## 2019-02-03 DIAGNOSIS — I10 Essential (primary) hypertension: Secondary | ICD-10-CM

## 2019-02-03 DIAGNOSIS — Z794 Long term (current) use of insulin: Secondary | ICD-10-CM | POA: Diagnosis not present

## 2019-02-03 DIAGNOSIS — N390 Urinary tract infection, site not specified: Secondary | ICD-10-CM | POA: Diagnosis not present

## 2019-02-03 DIAGNOSIS — J9611 Chronic respiratory failure with hypoxia: Secondary | ICD-10-CM

## 2019-02-03 DIAGNOSIS — E118 Type 2 diabetes mellitus with unspecified complications: Secondary | ICD-10-CM | POA: Diagnosis not present

## 2019-02-03 DIAGNOSIS — M546 Pain in thoracic spine: Secondary | ICD-10-CM

## 2019-02-03 DIAGNOSIS — I509 Heart failure, unspecified: Secondary | ICD-10-CM | POA: Diagnosis not present

## 2019-02-03 LAB — LIPID PANEL
Cholesterol: 100 mg/dL (ref 0–200)
HDL: 51 mg/dL (ref >39.00)
LDL Cholesterol: 25 mg/dL (ref 0–99)
NonHDL: 49.18
Total CHOL/HDL Ratio: 2
Triglycerides: 121 mg/dL (ref 0.0–149.0)
VLDL: 24.2 mg/dL (ref 0.0–40.0)

## 2019-02-03 LAB — HEPATIC FUNCTION PANEL
ALT: 7 U/L (ref 0–35)
AST: 15 U/L (ref 0–37)
Albumin: 4.1 g/dL (ref 3.5–5.2)
Alkaline Phosphatase: 111 U/L (ref 39–117)
Bilirubin, Direct: 0.1 mg/dL (ref 0.0–0.3)
Total Bilirubin: 0.4 mg/dL (ref 0.2–1.2)
Total Protein: 7.6 g/dL (ref 6.0–8.3)

## 2019-02-03 LAB — CBC WITH DIFFERENTIAL/PLATELET
Basophils Absolute: 0.1 10*3/uL (ref 0.0–0.1)
Basophils Relative: 0.9 % (ref 0.0–3.0)
Eosinophils Absolute: 0.2 10*3/uL (ref 0.0–0.7)
Eosinophils Relative: 3.1 % (ref 0.0–5.0)
HCT: 29.6 % — ABNORMAL LOW (ref 36.0–46.0)
Hemoglobin: 9.2 g/dL — ABNORMAL LOW (ref 12.0–15.0)
Lymphocytes Relative: 19.9 % (ref 12.0–46.0)
Lymphs Abs: 1.5 10*3/uL (ref 0.7–4.0)
MCHC: 31 g/dL (ref 30.0–36.0)
MCV: 80.3 fl (ref 78.0–100.0)
Monocytes Absolute: 0.3 10*3/uL (ref 0.1–1.0)
Monocytes Relative: 4.6 % (ref 3.0–12.0)
Neutro Abs: 5.3 10*3/uL (ref 1.4–7.7)
Neutrophils Relative %: 71.5 % (ref 43.0–77.0)
Platelets: 245 10*3/uL (ref 150.0–400.0)
RBC: 3.68 Mil/uL — ABNORMAL LOW (ref 3.87–5.11)
RDW: 14.8 % (ref 11.5–15.5)
WBC: 7.4 10*3/uL (ref 4.0–10.5)

## 2019-02-03 LAB — POCT URINALYSIS DIPSTICK
Blood, UA: NEGATIVE
Glucose, UA: NEGATIVE
Ketones, UA: 5
Nitrite, UA: NEGATIVE
Protein, UA: POSITIVE — AB
Spec Grav, UA: 1.015 (ref 1.010–1.025)
Urobilinogen, UA: 1 E.U./dL
pH, UA: 5.5 (ref 5.0–8.0)

## 2019-02-03 LAB — BASIC METABOLIC PANEL
BUN: 26 mg/dL — ABNORMAL HIGH (ref 6–23)
CO2: 30 mEq/L (ref 19–32)
Calcium: 9.7 mg/dL (ref 8.4–10.5)
Chloride: 99 mEq/L (ref 96–112)
Creatinine, Ser: 1.39 mg/dL — ABNORMAL HIGH (ref 0.40–1.20)
GFR: 44.65 mL/min — ABNORMAL LOW (ref >60.00)
Glucose, Bld: 114 mg/dL — ABNORMAL HIGH (ref 70–99)
Potassium: 4.1 mEq/L (ref 3.5–5.1)
Sodium: 138 mEq/L (ref 135–145)

## 2019-02-03 LAB — TSH: TSH: 1.19 u[IU]/mL (ref 0.35–4.50)

## 2019-02-03 LAB — HEMOGLOBIN A1C: Hgb A1c MFr Bld: 6.2 % (ref 4.6–6.5)

## 2019-02-03 MED ORDER — SULFAMETHOXAZOLE-TRIMETHOPRIM 800-160 MG PO TABS: 1.0000 | ORAL_TABLET | Freq: Two times a day (BID) | ORAL | 0 refills | Status: AC

## 2019-02-03 MED ORDER — DICLOFENAC SODIUM 1 % TD GEL: 2.0000 g | Freq: Four times a day (QID) | TRANSDERMAL | 0 refills | Status: DC | PRN

## 2019-02-03 NOTE — Progress Notes (Signed)
Subjective:    Patient ID: Debra Saunders, female    DOB: 03-29-1946, 73 y.o.   MRN: [id number]  HPI   Patient presents to clinic complaining of increased urinary frequency, urgency and burning with urination has been present for 2 to 3 days.  Denies nausea, vomiting or diarrhea.  Appetite has been normal.  Also reports some right-sided upper back pain that comes and goes.  Notices it especially when turning and leaning towards the right side and having to bend over and reach.  Denies any fall or lifting that could have caused injury to the area.  Patient wears chronic O2 related to congestive heart failure,  A. Fib, chronic respiratory failure.  States her breathing is at baseline, denies feeling more short of breath having cough or chest pain.  Also follows regularly with cardiology.  She is anticoagulated on Eliquis.  Patient states she has not been checking her blood sugars much lately, so cannot give me a range of where sugars has been.  Denies symptoms of hypoglycemia.  Patient Active Problem List   Diagnosis Date Noted  . acute on chronic diastolic CHF (congestive heart failure) ([place]) 04/15/2018  . Acute renal failure ([place])   . Shortness of breath   . Atrial fibrillation ([place]) 09/22/2017  . Acute bilateral low back pain without sciatica 01/28/2017  . Pain in limb 07/22/2016  . Neuropathy 06/09/2016  . Pain in the chest 03/10/2016  . Chronic deep vein thrombosis (DVT) ([facility]) 03/03/2016  . Chronic back pain 10/19/2015  . Goiter 04/25/2015  . Cervical disc disorder with radiculopathy of cervical region 03/30/2015  . Elevated LFTs 06/29/2014  . 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 Abnormality of gait 01/13/2014  . Sinoatrial node dysfunction ([address]) 01/09/2014  . Allergic rhinitis 10/26/2013  . Lymphedema 08/03/2013  . Constipation 04/12/2013  . Pulmonary embolus ([place]) 01/19/2013  . Subarachnoid hemorrhage ([place]) 01/19/2013  . Musculoskeletal chest pain 01/19/2013  .  Anemia 04/28/2012  . Chronic respiratory failure with hypoxia ([place]) 04/26/2012  . Diabetes ([place]) 03/30/2012  . Pulmonary hypertension, unspecified ([place]) 03/30/2012  . Hyperlipidemia 03/30/2012  . Hypothyroidism 03/30/2012  . Chronic systolic heart failure ([place]) 01/22/2012  . Hypertension    Social History   Tobacco Use  . Smoking status: Former Smoker    Packs/day: 0.30    Years: 2.00    Pack years: 0.60    Types: Cigarettes    Quit date: 06/23/1990    Years since quitting: 28.6  . Smokeless tobacco: Never Used  Substance Use Topics  . Alcohol use: No     Review of Systems  Constitutional: Negative for chills, fatigue and fever.  HENT: Negative for congestion, ear pain, sinus pain and sore throat.   Eyes: Negative.   Respiratory: Negative for cough, shortness of breath and wheezing.   Cardiovascular: Negative for chest pain, palpitations and leg swelling.  Gastrointestinal: Negative for abdominal pain, diarrhea, nausea and vomiting.  Genitourinary: +dysuria, frequency and urgency.  Musculoskeletal: +right upper back pain Skin: Negative for color change, pallor and rash.  Neurological: Negative for syncope, light-headedness and headaches.  Psychiatric/Behavioral: The patient is not nervous/anxious.       Objective:   Physical Exam Vitals signs and nursing note reviewed.  Constitutional:      General: She is not in acute distress.    Appearance: She is not toxic-appearing.  HENT:     Head: Normocephalic and atraumatic.  Eyes:     Extraocular Movements: Extraocular movements intact.  Pupils: Pupils are equal, round, and reactive to light.  Cardiovascular:     Rate and Rhythm: Normal rate.     Heart sounds: Normal heart sounds.  Pulmonary:     Effort: Pulmonary effort is normal. No respiratory distress.     Breath sounds: Normal breath sounds.     Comments: Wearing O2 via [place] Abdominal:     General: Bowel sounds are normal. There is no distension.      Palpations: Abdomen is soft.     Tenderness: There is no abdominal tenderness. There is no right CVA tenderness, left CVA tenderness or guarding.  Musculoskeletal:       Back:     Right lower leg: No edema.     Left lower leg: No edema.     Comments: Location of right-sided upper back pain indicated by blue circle on diagram.  Pain in area is reproducible with palpation and does feel better when area is massaged. No pain on spine with palpation.   Skin:    General: Skin is warm and dry.     Coloration: Skin is not jaundiced or pale.  Neurological:     General: No focal deficit present.     Mental Status: She is alert and oriented to person, place, and time.     Gait: Gait normal.  Psychiatric:        Mood and Affect: Mood normal.        Behavior: Behavior normal.     Today's Vitals   02/03/19 1350  BP: 122/64  Pulse: 61  Resp: 20  Temp: (!) 97.5 F (36.4 C)  TempSrc: Temporal  SpO2: 90%  Weight: 197 lb 3.2 oz (89.4 kg)  Height: 5' (1.524 m)   Body mass index is 38.51 kg/m.    Assessment & Plan:    UTI-patient's urinalysis is suspicious for urinary tract infection, we will treat with course of Bactrim twice daily for 5 days.  She has been advised to increase water intake and avoid excess sugary and caffeinated beverages.  We will send urine culture to lab.  Right-sided upper back pain-suspect this is musculoskeletal in nature due to the fact that I can reproduce pain with palpation.  Patient would like x-ray of area for further investigation.  We will do chest x-ray.  Type 2 diabetes-patient will continue her current medications at this time.  We will get new A1c and lab work to see where her sugar numbers have been standing.  CHF/A. fib/chronic respiratory failure-patient will continue wearing oxygen via nasal cannula, continue Eliquis and all other cardiac medications and continue regular follow-up with cardiology.  Patient will follow-up here in our office in 3  months for recheck.  She will keep all follow-ups with specialist as scheduled.  She is aware she can call office anytime with questions or concerns.  Patient made aware that flu vaccines will be available in the next few weeks and we will make all of our patients aware when we can begin to schedule them for their flu vaccinations.

## 2019-02-05 LAB — URINE CULTURE
MICRO NUMBER:: 768594
SPECIMEN QUALITY:: ADEQUATE

## 2019-02-07 ENCOUNTER — Telehealth: Payer: Self-pay | Admitting: Family Medicine

## 2019-02-07 DIAGNOSIS — N390 Urinary tract infection, site not specified: Secondary | ICD-10-CM

## 2019-02-07 NOTE — Telephone Encounter (Signed)
Patient says the Bactrim gives her a terrible headache and made her very sick can you call something else in for UTI.  Patient has allergies to aspirin and Penicillin.

## 2019-02-07 NOTE — Telephone Encounter (Signed)
Patient was seen by Ander Purpura. I will forward to her to determine an appropriate antibiotic replacement.

## 2019-02-07 NOTE — Telephone Encounter (Signed)
Pt was just seen by DR S for possible UTI was given antibiotics with aspirin that gave her a headache she is not to take aspirin/ stopped drug but needs to know what to do about UTI. Please FU with Trinidad Curet, Community paramedic now or [name] apixaban (ELIQUIS) 5 MG TABS tablet has aspirin in it. [phone]

## 2019-02-08 MED ORDER — CIPROFLOXACIN HCL 250 MG PO TABS: 250.0000 mg | ORAL_TABLET | Freq: Two times a day (BID) | ORAL | 0 refills | Status: DC

## 2019-02-08 NOTE — Telephone Encounter (Signed)
Patient notified and voiced understanding.

## 2019-02-08 NOTE — Telephone Encounter (Signed)
Cipro sent instead

## 2019-02-10 ENCOUNTER — Other Ambulatory Visit (HOSPITAL_COMMUNITY): Payer: Self-pay

## 2019-02-10 ENCOUNTER — Other Ambulatory Visit: Payer: Self-pay | Admitting: Family Medicine

## 2019-02-10 ENCOUNTER — Encounter (HOSPITAL_COMMUNITY): Payer: Self-pay

## 2019-02-10 NOTE — Progress Notes (Signed)
Bgl:122 Today was a home visit to refill her med boxes.  She states she is having bouts of diarrhea today, which she has sometimes.  She states other than that, she been doing ok.  She has lost a couple of more lbs.  Meds verified and med box refilled.  Called in refills for uptravi, gabapentin.  She has not picked up her cipro yet, will do it tomorrow, she thought it was going to be expensive, called and checked price for her.  She denies headaches, dizziness, chest pain or shortness of breath at rest.  Explained to her to eat with medications and to drink enough water during the day to prevent dehydration.  She states appetite is ok, still only eating 1 or 2 meals a day.  She has snacks in between but not much.  Swelling in down in extremities.  Will continue to visit for heart failure and medication compliance.   [place] [phone]

## 2019-02-11 ENCOUNTER — Other Ambulatory Visit: Payer: Self-pay

## 2019-02-11 MED ORDER — GABAPENTIN 100 MG PO CAPS: 200.0000 mg | ORAL_CAPSULE | Freq: Three times a day (TID) | ORAL | 0 refills | Status: DC

## 2019-02-14 ENCOUNTER — Other Ambulatory Visit: Payer: Self-pay | Admitting: Family Medicine

## 2019-02-14 ENCOUNTER — Other Ambulatory Visit: Payer: Self-pay | Admitting: *Deleted

## 2019-02-14 ENCOUNTER — Encounter: Payer: Self-pay | Admitting: *Deleted

## 2019-02-14 ENCOUNTER — Telehealth (HOSPITAL_COMMUNITY): Payer: Self-pay

## 2019-02-14 DIAGNOSIS — Z794 Long term (current) use of insulin: Secondary | ICD-10-CM

## 2019-02-14 DIAGNOSIS — E118 Type 2 diabetes mellitus with unspecified complications: Secondary | ICD-10-CM

## 2019-02-14 MED ORDER — TRULICITY 0.75 MG/0.5ML ~~LOC~~ SOAJ: SUBCUTANEOUS | 2 refills | Status: DC

## 2019-02-14 NOTE — Telephone Encounter (Signed)
Debra Saunders contacted me this am to advijse me she just took her last [name] shot.  She was set up on a program through [facility] last year and she does not have the information for it.  She also does not have refills to even try to afford to pick up any at pharmacy.  Contacted her PCP and they are going to send in refills and I will check to see if she can afford it.  Also contacted [facility] and advised them the same.  They are sending her case to a case worker to contact her.  [name] the same and that [facility] will be contacting her within 7 to 10 days.    [place] [phone]

## 2019-02-14 NOTE — Patient Outreach (Addendum)
[name]) Care Management [place] Telephone Outreach, Screening  02/14/2019  [name] 10/28/1945 [id number]  Successful telephone outreach to Debra Saunders, 73 y/o female referred to [facility] by established care provider Community EMT-P heart failure program Debra Saunders; patient contacted [name] earlier today and discussed concerns that she has around obtaining/ affording refills for her trulicity; [facility] team had previously assisted patient in obtaining patient financial assistance for this medication; patient reported that she no longer has [name] contact numbers, and requested [name]'s assistance in contacting [place] [facility] team for ongoing assistance.  Patient has history including, but not limited to, DM (A1-C 6.2 on February 03, 2019); HTN/ HLD; CHF; COPD; and A-Fib.  Purpose of call/ referral discussed with patient, who provides verbal consent for [facility] CM involvement in her care.  Scope of [facility] CM services discussed with patient, who reports that she is "doing really good," and denies needs "other than medication assistance" with her established [name] prescription.  Patient denies pain and new/ recent falls and sounds to be in no distress throughout entirety of phone call today.  Screening completed and SDOH completed for nutrition and transportation- with no needs/ concerns identified.  Patient reports that she has had community visits at her home with Debra Saunders "every 2 weeks for over a year now."  She denies community resource needs, stating that she has several family members who live locally and assist with care needs/ errands/ transportation as indicated; patient reports she is currently living alone with frequent visits by local family members.  Reports has established services with meals on Wheels  Patient further reports: -- no concerns with medications "other than trulicity;" states that she has "the foundation" which assists her in  affording her "heart medications;" reports self-manages medications and denies issues with swallowing medications; reports "just about finished" with antibiotic recently prescribed for UTI by PCP on 02/03/2019- states "feeling much better now."  -- weighs daily at home and reports has lost 20 pounds since February; patient is able to accurately verbalize weight gain guidelines in setting of CHF, along with corresponding action plan.  Using home O2 as prescribed at 4 L/min "all the time"; denies recent issues with breathing status and reports has not had to use nebulizer at home "since last winter."  States that she believes her "breathing is very stable," and confirms that she has had no recent changes/ concerns around her breathing.  Continues using CPAP at night.  Reports daily weight ranges between 195-200 over last several months, with weight today of "196 lbs;" reports EMT-P [name] reviews weights with her with each home visit  -- monitors and records blood sugars twice daily; reports accurate understanding of recently obtained A1-C value of 6.2- states that she has "been doing great" since being placed on [name]; reports watching intake of sweets/ carbs/ and sodium; states she has been able to lose 20 pounds since February- positive reinforcement provided.  Reports blood sugar ranges today of 120-130 fasting over last several weeks  Patient denies further issues, concerns, or problems today, and screening call was completed without identification of care coordination/ management needs.  Confirmed that patient is receiving established care coordination and community case management through paramedicine program  I provided patient with my direct phone number, the main [place] office phone number, and the [facility] CM 24-hour nurse advice phone number should issues arise in the future; made patient aware that I would place [facility] referral to address her  concerns around affording [name] and for  polypharmacy; encouraged patient to listen for [place] team phone call, and she agreed to do so.  Care Coordination outreaches placed to [name], [facility] Pharmacist located at [place] (PCP office) and to Debra Saunders with the community heart failure paramedicine program to update on screening call today.  Plan:  Will mail patient [facility] CM successful outreach letter, should she have care coordination/ management needs in the future  Will place [place] referral for financial assistance [name] and polypharmacy  Will make patient's PCP aware that I have placed [place] referral and that other care management needs were not identified  [name], RN, BSN, [employer] Coordinator [facility] Care Management  [phone]

## 2019-02-14 NOTE — Progress Notes (Unsigned)
This encounter was created in error - please disregard.

## 2019-02-14 NOTE — Telephone Encounter (Signed)
Medication Refill - Medication: Dulaglutide ([name]) 5.61 RK/8.8SD SOPN  Preferred Pharmacy:  [facility] ([facility] [place] [phone] (Phone) [phone] (Fax)     Agent: Please be advised that RX refills may take up to 3 business days. We ask that you follow-up with your pharmacy.

## 2019-02-17 ENCOUNTER — Ambulatory Visit: Payer: Medicare HMO | Admitting: Pharmacist

## 2019-02-17 DIAGNOSIS — E118 Type 2 diabetes mellitus with unspecified complications: Secondary | ICD-10-CM

## 2019-02-17 DIAGNOSIS — I5022 Chronic systolic (congestive) heart failure: Secondary | ICD-10-CM

## 2019-02-17 DIAGNOSIS — I480 Paroxysmal atrial fibrillation: Secondary | ICD-10-CM

## 2019-02-17 DIAGNOSIS — Z794 Long term (current) use of insulin: Secondary | ICD-10-CM

## 2019-02-17 NOTE — Patient Instructions (Signed)
Visit Information  Goals Addressed            This Visit's Progress     Patient Stated   . "I need my [name]" (pt-stated)       Current Barriers:  . Diabetes: controlled; most recent A1c 6.2% o Reports that she took her last injection of [name] earlier this week. Per note from [facility] on 07/27/2018 that patient was approved for [employer] through 06/23/2019. Likely that patient is due for a refill. Reports that her children were going to pay for 1 month of the medication, and were planning to pick it up from the pharmacy this morning  . Current antihyperglycemic regimen: metformin 295 mg BID, [name] 1.88 mg once week . Denies hypoglycemic symptoms, including dizziness, lightheadedness, shaking, sweating . Current blood glucose readings: Fasting 120-130; evening 120-140s . Cardiovascular risk reduction- followed extensively by heart failure clinic/paramedicine program;  o Current hypertensive/CHF regimen: amlodipine 10 mg daily, digoxin 0.125 mg daily, Opsumit 10 mg daily, Uptravi 600 mcg BID, Revatio 20 mg TID o Current hyperlipidemia regimen: rosuvastatin 10 mg daily; LDL at goal <70 on last check  Pharmacist Clinical Goal(s):  [name] Over the next 90 days, patient with work with PharmD and primary care provider to address optimized medication management  Interventions: . Counseled patient to check and see if her children picked up her [name] prescription at the pharmacy already. If not, will collaborate with clinic staff to provide samples.  [place] [facility], left verbal prescription for [name] 4.16 mg once weekly, 4 month supply with 2 refills. Requested a call back to me if there are any other concerns. Will follow up with [facility] next week on this.   Patient Self Care Activities:  . Patient will check blood glucose BID , document, and provide at future appointments . Patient will focus on medication adherence by using pill boxes  filled by paramedicine . Patient will report any questions or concerns to provider   Initial goal documentation        Debra Saunders was given information about Chronic Care Management services today including:  1. CCM service includes personalized support from designated clinical staff supervised by her physician, including individualized plan of care and coordination with other care providers 2. 24/7 contact phone numbers for assistance for urgent and routine care needs. 3. Service will only be billed when office clinical staff spend 20 minutes or more in a month to coordinate care. 4. Only one practitioner may furnish and bill the service in a calendar month. 5. The patient may stop CCM services at any time (effective at the end of the month) by phone call to the office staff. 6. The patient will be responsible for cost sharing (co-pay) of up to 20% of the service fee (after annual deductible is met).  Patient agreed to services and verbal consent obtained.   The patient verbalized understanding of instructions provided today and declined a print copy of patient instruction materials.   Plan: - Patient will return my call if she needs [name] samples. I will follow up with [place] within 1-3 business days to follow up on shipping status of her medication  Debra Saunders, PharmD, [place] Pharmacist [facility] [phone]

## 2019-02-17 NOTE — Chronic Care Management (AMB) (Signed)
Chronic Care Management   Note  02/17/2019 Name: Debra Saunders MRN: [id number] DOB: 1945-10-09   Subjective:  Debra CASTRONOVO is a 73 y.o. year old female who is a primary care patient of [name], [name], MD. The CCM team was consulted for assistance with chronic disease management and care coordination needs.    Contacted patient telephonically today regarding notification that she needed help refilling [name] with [employer]. Discussed CCM program.   [name] was given information about Chronic Care Management services today including:  1. CCM service includes personalized support from designated clinical staff supervised by her physician, including individualized plan of care and coordination with other care providers 2. 24/7 contact phone numbers for assistance for urgent and routine care needs. 3. Service will only be billed when office clinical staff spend 20 minutes or more in a month to coordinate care. 4. Only one practitioner may furnish and bill the service in a calendar month. 5. The patient may stop CCM services at any time (effective at the end of the month) by phone call to the office staff. 6. The patient will be responsible for cost sharing (co-pay) of up to 20% of the service fee (after annual deductible is met).  Patient agreed to services and verbal consent obtained.   Review of patient status, including review of consultants reports, laboratory and other test data, was performed as part of comprehensive evaluation and provision of chronic care management services.   Objective:  Lab Results  Component Value Date   CREATININE 1.39 (H) 02/03/2019   CREATININE 1.28 (H) 01/20/2019   CREATININE 1.40 (H) 11/19/2018    Lab Results  Component Value Date   HGBA1C 6.2 02/03/2019       Component Value Date/Time   CHOL 100 02/03/2019 1413   CHOL 137 02/03/2013 0441   TRIG 121.0 02/03/2019 1413   TRIG 197 02/03/2013 0441   HDL 51.00 02/03/2019 1413   HDL 25 (L) 02/03/2013 0441   CHOLHDL 2 02/03/2019 1413   VLDL 24.2 02/03/2019 1413   VLDL 39 02/03/2013 0441   LDLCALC 25 02/03/2019 1413   LDLCALC 73 02/03/2013 0441    Clinical ASCVD: Yes    BP Readings from Last 3 Encounters:  02/10/19 (!) 115/56  02/03/19 122/64  01/27/19 122/70    Allergies  Allergen Reactions   Aspirin Hives and Other (See Comments)    Pt states that she is unable to take because she had bleeding in her brain   Other Other (See Comments)    Pt states that she is unable to take blood thinners because she had bleeding in her brain; Blood Thinners-per doctor at [facility], Shortness Of Breath, Swelling and Other (See Comments)    Has patient had a PCN reaction causing immediate rash, facial/tongue/throat swelling, SOB or lightheadedness with hypotension: Yes Has patient had a PCN reaction causing severe rash involving mucus membranes or skin necrosis: No Has patient had a PCN reaction that required hospitalization No Has patient had a PCN reaction occurring within the last 10 years: No If all of the above answers are "NO", then may proceed with Cephalosporin use.    Medications Reviewed Today    Reviewed by Knox Royalty, RN (Registered Nurse) on 02/14/19 at 61  Med List Status: <None>  Medication Order Taking? Sig Documenting Provider Last Dose Status Informant  Accu-Chek Softclix Lancets lancets [id number] No TEST UP TO FOUR TIMES DAILY AS DIRECTED [name], MD  Taking Active   acetaminophen (TYLENOL) 325 MG tablet [id number] No Take 325-650 mg by mouth every 6 (six) hours as needed for mild pain. Reported on 10/10/2015 [provider] Taking Active Self  albuterol (PROVENTIL) (2.5 MG/3ML) 0.083% nebulizer solution [id number] No Take 3 mLs (2.5 mg total) by nebulization every 6 (six) hours. Loletha Grayer, MD Taking Active Self  amLODipine (NORVASC) 10 MG tablet [id number] No Take 1 tablet (10 mg total) by mouth  daily. Larey Dresser, MD Taking Active   apixaban (ELIQUIS) 5 MG TABS tablet [id number] No Take 1 tablet (5 mg total) by mouth 2 (two) times daily. Larey Dresser, MD Taking Active   Blood Glucose Monitoring Suppl (ACCU-CHEK AVIVA PLUS) w/Device Drucie Opitz [id number] No USE AS DIRECTED [name], MD Taking Active   ciprofloxacin (CIPRO) 250 MG tablet [id number] No Take 1 tablet (250 mg total) by mouth 2 (two) times daily. Jodelle Green, FNP Taking Active   cyanocobalamin 1000 MCG tablet [id number] No Take 1,000 mcg by mouth daily. Daily in am [provider] Taking Active   diclofenac sodium (VOLTAREN) 1 % GEL [id number] No Apply 2 g topically 4 (four) times daily as needed. Jodelle Green, FNP Taking Active   digoxin (LANOXIN) 0.125 MG tablet [id number] No Take 1 tablet (0.125 mg total) by mouth daily. Larey Dresser, MD Taking Active   Dulaglutide ([name]) 0.92 ZR/0.0TM [facility] [id number]  INJECT 0.75MG SUBCUTANEOUSLY ONCE A WEEK [name], MD  Active   gabapentin (NEURONTIN) 100 MG capsule [id number]  Take 2 capsules (200 mg total) by mouth 3 (three) times daily. [name], MD  Active   glucose blood (TRUE METRIX BLOOD GLUCOSE TEST) test strip [id number] No Check blood glucose twice daily Dx:E11.8, Z79.4 [name], MD Taking Active Self  glucose blood test strip [id number] No Use as instructed [name], MD Taking Active   macitentan (OPSUMIT) 10 MG tablet [id number] No Take 1 tablet (10 mg total) by mouth daily. Georgiana Shore, NP Taking Active   meclizine (ANTIVERT) 25 MG tablet [id number] No Take 1 tablet (25 mg total) by mouth 3 (three) times daily as needed for dizziness.  Patient not taking: Reported on 02/10/2019   [name], MD Not Taking Active   Menthol, Topical Analgesic, [facility] EX) [id number] No Apply 1 application topically as needed (for pain).  [provider] Taking Active Self  metFORMIN (GLUCOPHAGE) 500 MG tablet  [id number] No Take 1 tablet (500 mg total) by mouth 2 (two) times daily with a meal. [name], MD Taking Active   ondansetron (ZOFRAN) 8 MG tablet [id number] No Take 1 tablet (8 mg total) by mouth every 8 (eight) hours as needed for nausea or vomiting. Jackolyn Confer, MD Taking Active Self           Med Note Cyndra Numbers Aug 16, 2017  9:43 AM)    OXYGEN [id number] No Inhale 4 L into the lungs continuous. [provider] Taking Active Self  potassium chloride SA (K-DUR) 20 MEQ tablet [id number] No Take 1 tablet (20 mEq total) by mouth daily. Georgiana Shore, NP Taking Active   rosuvastatin (CRESTOR) 10 MG tablet [id number] No Take 1 tablet by mouth once daily [name], MD Taking Active   Selexipag (UPTRAVI) 200 MCG TABS [id number] No Take 1 tablet (200 mcg total) by mouth 2 (two) times daily.  Patient taking differently: Take 600 mcg by mouth 2 (two)  times daily. Going up to 3tabs on aug 1st 2020   Larey Dresser, MD Taking Active Self  sildenafil (REVATIO) 20 MG tablet [id number] No Take 20 mg by mouth 3 (three) times daily. [provider] Taking Active Self  spironolactone (ALDACTONE) 25 MG tablet [id number] No Take 1 tablet (25 mg total) by mouth at bedtime. Larey Dresser, MD Taking Expired 02/10/19 2359   torsemide (DEMADEX) 20 MG tablet [id number] No Take 3 tablets (60 mg total) by mouth every morning AND 2 tablets (40 mg total) every evening. Georgiana Shore, NP Taking Active            Assessment:   Goals Addressed            This Visit's Progress     Patient Stated    "I need my [name]" (pt-stated)       Current Barriers:   Diabetes: controlled; most recent A1c 6.2% o Reports that she took her last injection of [name] earlier this week. Per note from [place] on 07/27/2018 that patient was approved for [employer] through 06/23/2019. Likely that patient is due for a refill. Reports that her children were going  to pay for 1 month of the medication, and were planning to pick it up from the pharmacy this morning   Current antihyperglycemic regimen: metformin 063 mg BID, [name] 8.68 mg once week  Denies hypoglycemic symptoms, including dizziness, lightheadedness, shaking, sweating  Current blood glucose readings: Fasting 120-130; evening 120-140s  Cardiovascular risk reduction- followed extensively by heart failure clinic/paramedicine program;  o Current hypertensive/CHF regimen: amlodipine 10 mg daily, digoxin 0.125 mg daily, Opsumit 10 mg daily, Uptravi 600 mcg BID, Revatio 20 mg TID o Current hyperlipidemia regimen: rosuvastatin 10 mg daily; LDL at goal <70 on last check  Pharmacist Clinical Goal(s):   Over the next 90 days, patient with work with PharmD and primary care provider to address optimized medication management  Interventions:  Counseled patient to check and see if her children picked up her [name] prescription at the pharmacy already. If not, will collaborate with clinic staff to provide samples.   Contacted [facility], left verbal prescription for [name] 5.48 mg once weekly, 4 month supply with 2 refills. Requested a call back to me if there are any other concerns. Will follow up with [facility] next week on this.   Patient Self Care Activities:   Patient will check blood glucose BID , document, and provide at future appointments  Patient will focus on medication adherence by using pill boxes filled by paramedicine  Patient will report any questions or concerns to provider   Initial goal documentation        Plan: - Patient will return my call if she needs [name] samples. I will follow up with [place] within 1-3 business days to follow up on shipping status of her medication  [name], PharmD, [place] Pharmacist [facility] [phone]

## 2019-02-21 ENCOUNTER — Other Ambulatory Visit (HOSPITAL_COMMUNITY): Payer: Self-pay

## 2019-02-21 MED ORDER — AMLODIPINE BESYLATE 10 MG PO TABS: 10.0000 mg | ORAL_TABLET | Freq: Every day | ORAL | 6 refills | Status: DC

## 2019-02-22 NOTE — Telephone Encounter (Signed)
This was entered on wrong date, this was telephone call on 02/04/2019.  [place] [phone]

## 2019-02-22 NOTE — Telephone Encounter (Signed)
Debra Saunders contacted me today and advised she is having reaction to her medication that [facility] prescribed.  She states her antibiotic has aspirin in it, she states last time she had aspirin she had a stroke and was concerned because she had a bad headache after taking it but headache has resolved.  She has no deficits and speech is good.  [name] state they will call her in something different.    [place] [phone]

## 2019-02-24 ENCOUNTER — Encounter (HOSPITAL_COMMUNITY): Payer: Self-pay

## 2019-02-24 ENCOUNTER — Other Ambulatory Visit (HOSPITAL_COMMUNITY): Payer: Self-pay

## 2019-02-24 NOTE — Progress Notes (Signed)
Today had a home visit with Debra Saunders.  She states been feeling good.  She denies chest pain, headaches, chest pain, and dizziness.  She states her appetite is still down, eats 1 meal a day.  Advised her about eating something with her meds.  She states she is drinking enough water.  She stays active around the house.  Walks with walker and wears oxygen 24 hours a day.  She has been taking all her medications.  Verified meds and filled med boxes.  Will continue to visit for heart failure.    [place] [phone]

## 2019-03-01 ENCOUNTER — Ambulatory Visit (INDEPENDENT_AMBULATORY_CARE_PROVIDER_SITE_OTHER): Payer: Medicare HMO | Admitting: *Deleted

## 2019-03-01 DIAGNOSIS — I495 Sick sinus syndrome: Secondary | ICD-10-CM

## 2019-03-02 LAB — CUP PACEART REMOTE DEVICE CHECK
Battery Remaining Longevity: 108 mo
Battery Remaining Percentage: 100 %
Brady Statistic RA Percent Paced: 73 %
Brady Statistic RV Percent Paced: 28 %
Date Time Interrogation Session: 20200908074600
Implantable Lead Implant Date: 20150720
Implantable Lead Implant Date: 20150720
Implantable Lead Location: 753859
Implantable Lead Location: 753860
Implantable Lead Model: 5076
Implantable Lead Model: 5076
Implantable Pulse Generator Implant Date: 20150720
Lead Channel Impedance Value: 356 Ohm
Lead Channel Impedance Value: 469 Ohm
Lead Channel Pacing Threshold Amplitude: 0.7 V
Lead Channel Pacing Threshold Pulse Width: 0.4 ms
Lead Channel Setting Pacing Amplitude: 2 V
Lead Channel Setting Pacing Amplitude: 2.4 V
Lead Channel Setting Pacing Pulse Width: 0.4 ms
Lead Channel Setting Sensing Sensitivity: 2.5 mV
Pulse Gen Serial Number: 390330

## 2019-03-07 ENCOUNTER — Ambulatory Visit: Payer: Medicare HMO | Admitting: Pharmacist

## 2019-03-07 ENCOUNTER — Telehealth (HOSPITAL_COMMUNITY): Payer: Self-pay

## 2019-03-07 DIAGNOSIS — Z794 Long term (current) use of insulin: Secondary | ICD-10-CM

## 2019-03-07 DIAGNOSIS — E118 Type 2 diabetes mellitus with unspecified complications: Secondary | ICD-10-CM

## 2019-03-07 NOTE — Patient Instructions (Signed)
Visit Information  Debra Saunders,   It was great talking with you today! I am glad you received the supply of [name].   Your blood sugars are FANTASTIC. Keep up the great work. We will continue metformin 500 mg twice daily and [name] 0.35 mg once weekly.   Please feel free to call me with any questions or concerns, particularly any related to medications.   Take care. I'll reach out in mid-October to check in with you.   [name], PharmD, CPP (in Dr. Ellen Henri office) [phone]  Goals Addressed            This Visit's Progress     Patient Stated   . "I want to stay healthy" (pt-stated)       Current Barriers:  . Diabetes: controlled; most recent A1c 6.2% . Current antihyperglycemic regimen: metformin 371 mg BID, [name] 6.96 mg once week o Reports she received her 4 month supply of [name] last week. She is very Patent attorney . Reports one day that her BG was in 100s, and she did feel some symptoms of lightheadedness . Current blood glucose readings: Fasting 110s-130s (though 1 reading in 100s); post prandial still at goal <180 . Cardiovascular risk reduction- followed extensively by heart failure clinic/paramedicine program; last paramedicine home visit 9/3;  o Current hypertensive/CHF regimen: amlodipine 10 mg daily, digoxin 0.125 mg daily, Opsumit 10 mg daily, Uptravi 600 mcg BID, Revatio 20 mg TID o Current hyperlipidemia regimen: rosuvastatin 10 mg daily; LDL at goal <70 on last check  Pharmacist Clinical Goal(s):  [name] Over the next 90 days, patient with work with PharmD and primary care provider to address optimized medication management  Interventions: . Patient denies any questions or concerns today. Appreciative of collaboration. . Counseled on goal A1c, fasting, post prandial readings, and importance of avoiding hypoglycemia. Reviewed treatment of symptomatic hypoglycemia; patient did appropriately treat with a small amount of juice  Patient Self Care  Activities:  . Patient will check blood glucose BID, document, and provide at future appointments . Patient will focus on medication adherence by using pill boxes filled by paramedicine . Patient will report any questions or concerns to provider   Please see past updates related to this goal by clicking on the "Past Updates" button in the selected goal         Print copy of patient instructions provided.    Plan:  - Will mail patient my direct contact information for any questions or concerns.  - Will outreach patient in 3-4 weeks for continued medication management support  [name], PharmD, [place] Pharmacist [facility] [phone]

## 2019-03-07 NOTE — Chronic Care Management (AMB) (Signed)
Chronic Care Management   Follow Up Note   03/07/2019 Name: Debra Saunders MRN: [id number] DOB: [birth year]  Referred by: [name], MD Reason for referral : Chronic Care Management (Medication Management)   Debra Saunders is a 73 y.o. year old female who is a primary care patient of [name], [name], MD. The CCM team was consulted for assistance with chronic disease management and care coordination needs.  Contacted patient for medication management review.     Review of patient status, including review of consultants reports, relevant laboratory and other test results, and collaboration with appropriate care team members and the patient's provider was performed as part of comprehensive patient evaluation and provision of chronic care management services.    SDOH (Social Determinants of Health) screening performed today: Financial Strain . See Care Plan for related entries.    Outpatient Encounter Medications as of 03/07/2019  Medication Sig   Accu-Chek Softclix Lancets lancets TEST UP TO FOUR TIMES DAILY AS DIRECTED   acetaminophen (TYLENOL) 325 MG tablet Take 325-650 mg by mouth every 6 (six) hours as needed for mild pain. Reported on 10/10/2015   albuterol (PROVENTIL) (2.5 MG/3ML) 0.083% nebulizer solution Take 3 mLs (2.5 mg total) by nebulization every 6 (six) hours.   amLODipine (NORVASC) 10 MG tablet Take 1 tablet (10 mg total) by mouth daily.   apixaban (ELIQUIS) 5 MG TABS tablet Take 1 tablet (5 mg total) by mouth 2 (two) times daily.   Blood Glucose Monitoring Suppl (ACCU-CHEK AVIVA PLUS) w/Device KIT USE AS DIRECTED   ciprofloxacin (CIPRO) 250 MG tablet Take 1 tablet (250 mg total) by mouth 2 (two) times daily.   cyanocobalamin 1000 MCG tablet Take 1,000 mcg by mouth daily. Daily in am   diclofenac sodium (VOLTAREN) 1 % GEL Apply 2 g topically 4 (four) times daily as needed.   digoxin (LANOXIN) 0.125 MG tablet Take 1 tablet (0.125 mg total) by mouth  daily.   Dulaglutide ([name]) 9.45 OP/9.2TW SOPN INJECT 0.75MG SUBCUTANEOUSLY ONCE A WEEK   gabapentin (NEURONTIN) 100 MG capsule Take 2 capsules (200 mg total) by mouth 3 (three) times daily.   glucose blood (TRUE METRIX BLOOD GLUCOSE TEST) test strip Check blood glucose twice daily Dx:E11.8, Z79.4   glucose blood test strip Use as instructed   macitentan (OPSUMIT) 10 MG tablet Take 1 tablet (10 mg total) by mouth daily.   meclizine (ANTIVERT) 25 MG tablet Take 1 tablet (25 mg total) by mouth 3 (three) times daily as needed for dizziness. (Patient not taking: Reported on 02/10/2019)   Menthol, Topical Analgesic, (BIOFREEZE EX) Apply 1 application topically as needed (for pain).    metFORMIN (GLUCOPHAGE) 500 MG tablet Take 1 tablet (500 mg total) by mouth 2 (two) times daily with a meal.   ondansetron (ZOFRAN) 8 MG tablet Take 1 tablet (8 mg total) by mouth every 8 (eight) hours as needed for nausea or vomiting.   OXYGEN Inhale 4 L into the lungs continuous.   potassium chloride SA (K-DUR) 20 MEQ tablet Take 1 tablet (20 mEq total) by mouth daily.   rosuvastatin (CRESTOR) 10 MG tablet Take 1 tablet by mouth once daily   Selexipag (UPTRAVI) 200 MCG TABS Take 1 tablet (200 mcg total) by mouth 2 (two) times daily. (Patient taking differently: Take 600 mcg by mouth 2 (two) times daily. Going up to 3tabs on aug 1st 2020)   sildenafil (REVATIO) 20 MG tablet Take 20 mg by mouth 3 (three) times daily.  spironolactone (ALDACTONE) 25 MG tablet Take 1 tablet (25 mg total) by mouth at bedtime.   torsemide (DEMADEX) 20 MG tablet Take 3 tablets (60 mg total) by mouth every morning AND 2 tablets (40 mg total) every evening.   No facility-administered encounter medications on file as of 03/07/2019.      Goals Addressed            This Visit's Progress     Patient Stated    "I want to stay healthy" (pt-stated)       Current Barriers:   Diabetes: controlled; most recent A1c  6.2%  Current antihyperglycemic regimen: metformin 446 mg BID, [name] 2.86 mg once week o Reports she received her 4 month supply of [name] last week. She is very appreciative  Reports one day that her BG was in 100s, and she did feel some symptoms of lightheadedness  Current blood glucose readings: Fasting 110s-130s (though 1 reading in 100s); post prandial still at goal <180  Cardiovascular risk reduction- followed extensively by heart failure clinic/paramedicine program; last paramedicine home visit 9/3;  o Current hypertensive/CHF regimen: amlodipine 10 mg daily, digoxin 0.125 mg daily, Opsumit 10 mg daily, Uptravi 600 mcg BID, Revatio 20 mg TID o Current hyperlipidemia regimen: rosuvastatin 10 mg daily; LDL at goal <70 on last check  Pharmacist Clinical Goal(s):   Over the next 90 days, patient with work with PharmD and primary care provider to address optimized medication management  Interventions:  Patient denies any questions or concerns today. Appreciative of collaboration.  Counseled on goal A1c, fasting, post prandial readings, and importance of avoiding hypoglycemia. Reviewed treatment of symptomatic hypoglycemia; patient did appropriately treat with a small amount of juice  Patient Self Care Activities:   Patient will check blood glucose BID, document, and provide at future appointments  Patient will focus on medication adherence by using pill boxes filled by paramedicine  Patient will report any questions or concerns to provider   Please see past updates related to this goal by clicking on the "Past Updates" button in the selected goal         Plan:  - Will mail patient my direct contact information for any questions or concerns.  - Will outreach patient in 3-4 weeks for continued medication management support  [name], PharmD, [place] Pharmacist [facility] [place] [phone]

## 2019-03-14 ENCOUNTER — Other Ambulatory Visit: Payer: Self-pay

## 2019-03-14 ENCOUNTER — Ambulatory Visit
Admission: RE | Admit: 2019-03-14 | Discharge: 2019-03-14 | Disposition: A | Discharge status: Home / Self Care | Payer: Medicare HMO | Source: Ambulatory Visit | Attending: Unknown Physician Specialty | Admitting: Unknown Physician Specialty

## 2019-03-14 DIAGNOSIS — E041 Nontoxic single thyroid nodule: Secondary | ICD-10-CM | POA: Diagnosis not present

## 2019-03-14 DIAGNOSIS — E042 Nontoxic multinodular goiter: Secondary | ICD-10-CM | POA: Diagnosis not present

## 2019-03-16 ENCOUNTER — Other Ambulatory Visit (HOSPITAL_COMMUNITY): Payer: Self-pay

## 2019-03-16 ENCOUNTER — Other Ambulatory Visit: Payer: Self-pay | Admitting: Family Medicine

## 2019-03-16 ENCOUNTER — Other Ambulatory Visit: Payer: Self-pay

## 2019-03-16 ENCOUNTER — Encounter: Payer: Self-pay | Admitting: Cardiology

## 2019-03-16 ENCOUNTER — Encounter (HOSPITAL_COMMUNITY): Payer: Self-pay

## 2019-03-16 DIAGNOSIS — E785 Hyperlipidemia, unspecified: Secondary | ICD-10-CM

## 2019-03-16 MED ORDER — AMLODIPINE BESYLATE 10 MG PO TABS: 10.0000 mg | ORAL_TABLET | Freq: Every day | ORAL | 6 refills | Status: DC

## 2019-03-16 MED ORDER — POTASSIUM CHLORIDE CRYS ER 20 MEQ PO TBCR: 20.0000 meq | EXTENDED_RELEASE_TABLET | Freq: Every day | ORAL | 3 refills | Status: DC

## 2019-03-16 MED ORDER — ROSUVASTATIN CALCIUM 10 MG PO TABS: 10.0000 mg | ORAL_TABLET | Freq: Every day | ORAL | 1 refills | Status: DC

## 2019-03-16 NOTE — Progress Notes (Signed)
Today was a home visit with [name].  She states not feeling well today, she has been having diarrhea from the Metformin and Uptravi.  She has bouts of here and there.  She states will go away tomorrow.  Appetite is good, she is watching her fluids, she states she is drinking enough, discussed with her 60 ounces a day.  She watches high sodium foods.  Meds verified and med box refilled.  Called in refills for crestor, gabapentin, potassium, and amlodipine.  She appears to be taking all her medications.  She is aware of when to take them.  She will go up on Uptravi starting next week to 4 in morning and 3 in afternoon.  She still have some side effects from last time she went up on it but she states not too bad. She has no swelling in her legs, her weight is coming down.  Her Bp has been down.  She denies chest pain, shortness of breath at rest.  She states a little dizzy today and has a headache.  Advised her to make sure she is drinking enough with the diarrhea and dont become dehydrated.  She is aware of upcoming appts.  Lungs sounds are clear.  Will continue to visit for heart failure, medication compliance and diet.   [place] [phone]

## 2019-03-16 NOTE — Progress Notes (Signed)
Remote pacemaker transmission.   

## 2019-03-31 ENCOUNTER — Other Ambulatory Visit (HOSPITAL_COMMUNITY): Payer: Self-pay

## 2019-04-01 DIAGNOSIS — G8929 Other chronic pain: Secondary | ICD-10-CM | POA: Diagnosis not present

## 2019-04-01 DIAGNOSIS — K228 Other specified diseases of esophagus: Secondary | ICD-10-CM | POA: Diagnosis not present

## 2019-04-01 DIAGNOSIS — R0989 Other specified symptoms and signs involving the circulatory and respiratory systems: Secondary | ICD-10-CM | POA: Diagnosis not present

## 2019-04-01 DIAGNOSIS — R109 Unspecified abdominal pain: Secondary | ICD-10-CM | POA: Diagnosis not present

## 2019-04-01 DIAGNOSIS — D649 Anemia, unspecified: Secondary | ICD-10-CM | POA: Diagnosis not present

## 2019-04-01 DIAGNOSIS — K529 Noninfective gastroenteritis and colitis, unspecified: Secondary | ICD-10-CM | POA: Diagnosis not present

## 2019-04-01 DIAGNOSIS — R1011 Right upper quadrant pain: Secondary | ICD-10-CM | POA: Diagnosis not present

## 2019-04-01 DIAGNOSIS — K760 Fatty (change of) liver, not elsewhere classified: Secondary | ICD-10-CM | POA: Diagnosis not present

## 2019-04-04 ENCOUNTER — Ambulatory Visit (INDEPENDENT_AMBULATORY_CARE_PROVIDER_SITE_OTHER): Payer: Medicare HMO | Admitting: Pharmacist

## 2019-04-04 DIAGNOSIS — E118 Type 2 diabetes mellitus with unspecified complications: Secondary | ICD-10-CM

## 2019-04-04 DIAGNOSIS — Z794 Long term (current) use of insulin: Secondary | ICD-10-CM

## 2019-04-04 NOTE — Chronic Care Management (AMB) (Signed)
Chronic Care Management   Follow Up Note   04/04/2019 Name: Debra Saunders MRN: [id number] DOB: [birth year]  Referred by: [name], MD Reason for referral : Chronic Care Management (Medication Management)   Debra Saunders is a 73 y.o. year old female who is a primary care patient of [name], [name], MD. The CCM team was consulted for assistance with chronic disease management and care coordination needs.   Contacted patient for medication management support.   Review of patient status, including review of consultants reports, relevant laboratory and other test results, and collaboration with appropriate care team members and the patient's provider was performed as part of comprehensive patient evaluation and provision of chronic care management services.    SDOH (Social Determinants of Health) screening performed today: Financial Strain . See Care Plan for related entries.   Advanced Directives Status: N See Care Plan and Vynca application for related entries.  Outpatient Encounter Medications as of 04/04/2019  Medication Sig  . Accu-Chek Softclix Lancets lancets TEST UP TO FOUR TIMES DAILY AS DIRECTED  . acetaminophen (TYLENOL) 325 MG tablet Take 325-650 mg by mouth every 6 (six) hours as needed for mild pain. Reported on 10/10/2015  . albuterol (PROVENTIL) (2.5 MG/3ML) 0.083% nebulizer solution Take 3 mLs (2.5 mg total) by nebulization every 6 (six) hours.  [name] amLODipine (NORVASC) 10 MG tablet Take 1 tablet (10 mg total) by mouth daily.  [name] apixaban (ELIQUIS) 5 MG TABS tablet Take 1 tablet (5 mg total) by mouth 2 (two) times daily.  . Blood Glucose Monitoring Suppl (ACCU-CHEK AVIVA PLUS) w/Device KIT USE AS DIRECTED  . cyanocobalamin 1000 MCG tablet Take 1,000 mcg by mouth daily. Daily in am  . diclofenac sodium (VOLTAREN) 1 % GEL Apply 2 g topically 4 (four) times daily as needed.  . dicyclomine (BENTYL) 20 MG tablet Take 20 mg by mouth every 6 (six) hours.  . digoxin  (LANOXIN) 0.125 MG tablet Take 1 tablet (0.125 mg total) by mouth daily.  . Dulaglutide ([name]) 7.02 OV/7.8HY SOPN INJECT 0.75MG SUBCUTANEOUSLY ONCE A WEEK  . gabapentin (NEURONTIN) 100 MG capsule TAKE 2 CAPSULES BY MOUTH THREE TIMES DAILY  . glucose blood (TRUE METRIX BLOOD GLUCOSE TEST) test strip Check blood glucose twice daily Dx:E11.8, Z79.4  . glucose blood test strip Use as instructed  . macitentan (OPSUMIT) 10 MG tablet Take 1 tablet (10 mg total) by mouth daily.  . Menthol, Topical Analgesic, (BIOFREEZE EX) Apply 1 application topically as needed (for pain).   . metFORMIN (GLUCOPHAGE) 500 MG tablet Take 1 tablet (500 mg total) by mouth 2 (two) times daily with a meal.  . OXYGEN Inhale 4 L into the lungs continuous.  . potassium chloride SA (K-DUR) 20 MEQ tablet Take 1 tablet (20 mEq total) by mouth daily.  . rosuvastatin (CRESTOR) 10 MG tablet Take 1 tablet (10 mg total) by mouth daily.  . Selexipag (UPTRAVI) 200 MCG TABS Take 1 tablet (200 mcg total) by mouth 2 (two) times daily. (Patient taking differently: Take 600 mcg by mouth 2 (two) times daily. Going up to 3tabs on aug 1st 2020)  . sildenafil (REVATIO) 20 MG tablet Take 20 mg by mouth 3 (three) times daily.  [name] spironolactone (ALDACTONE) 25 MG tablet Take 1 tablet (25 mg total) by mouth at bedtime.  . torsemide (DEMADEX) 20 MG tablet Take 3 tablets (60 mg total) by mouth every morning AND 2 tablets (40 mg total) every evening.  . meclizine (ANTIVERT) 25 MG  tablet Take 1 tablet (25 mg total) by mouth 3 (three) times daily as needed for dizziness. (Patient not taking: Reported on 02/10/2019)  . ondansetron (ZOFRAN) 8 MG tablet Take 1 tablet (8 mg total) by mouth every 8 (eight) hours as needed for nausea or vomiting. (Patient not taking: Reported on 04/04/2019)  . [DISCONTINUED] ciprofloxacin (CIPRO) 250 MG tablet Take 1 tablet (250 mg total) by mouth 2 (two) times daily. (Patient not taking: Reported on 03/16/2019)  .  [DISCONTINUED] sildenafil (REVATIO) 20 MG tablet Take by mouth.   No facility-administered encounter medications on file as of 04/04/2019.      Goals Addressed            This Visit's Progress     Patient Stated   . "I want to stay healthy" (pt-stated)       Current Barriers:  . Diabetes: controlled; most recent A1c 6.2% . Current antihyperglycemic regimen: metformin 320 mg BID, [name] 2.33 mg once week o Notes some diarrhea right now, as well as RUQ pain. Saw Cherylynn Ridges, PA at [facility] gastroenterology last week. Rib Xray negative, so they are planning a CT abd/pelvis to further evaluate pain o Patient reports that she was told that Voltaren can cause diarrhea, so she decided not to apply it. Discussed this w/ Cherylynn Ridges; they recommended that she try this medication for back pain o Notes that she started dicyclomine this past Saturday; doesn't feel like it has provided significant benefit yet. . Current blood glucose readings: Fasting <130; post prandial still at goal <180 . Cardiovascular risk reduction- followed extensively by heart failure clinic/paramedicine program; last paramedicine home visit 9/23;  o Current hypertensive/CHF regimen: amlodipine 10 mg daily, digoxin 0.125 mg daily, Opsumit 10 mg daily, Uptravi 600 mcg BID, Revatio 20 mg TID o Current hyperlipidemia regimen: rosuvastatin 10 mg daily; LDL at goal <70 on last check  Pharmacist Clinical Goal(s):  [name] Over the next 90 days, patient with work with PharmD and primary care provider to address optimized medication management  Interventions: . Discussed that diarrhea is a very infrequent side effect of topical diclofenac. Recommended she try applying at least BID for 5-7 days to see if if provides any pain relief benefit; if not, the underlying pain is unlikely to benefit from the anti-inflammatory affect. She verbalized understanding . Praised for continued maintenance of controlled blood glucose. Will discuss  4356 re-application for [name] assistance moving forward, once [employer] releases criteria for 2021.  Patient Self Care Activities:  . Patient will check blood glucose BID, document, and provide at future appointments . Patient will focus on medication adherence by using pill boxes filled by paramedicine . Patient will report any questions or concerns to provider   Please see past updates related to this goal by clicking on the "Past Updates" button in the selected goal          Plan: - As patient receives frequent home visits from paramedicine, will push out my follow up phone call to another 6-8 weeks for continued medication management support  [name], PharmD, [place] Pharmacist [place] [phone]

## 2019-04-04 NOTE — Patient Instructions (Addendum)
Debra Saunders,   It was great talking to you today! Keep up the fantastic work with your health, especially those blood sugars!  I'll reach out late November/early December about the plan to re-apply for [name] assistance for 2021.   Feel free to give me a call if you have any questions or concerns!  Visit Information  Goals Addressed            This Visit's Progress     Patient Stated   . "I want to stay healthy" (pt-stated)       Current Barriers:  . Diabetes: controlled; most recent A1c 6.2% . Current antihyperglycemic regimen: metformin 466 mg BID, [name] 5.99 mg once week o Notes some diarrhea right now, as well as RUQ pain. Saw Cherylynn Ridges, PA at [facility] gastroenterology last week. Rib Xray negative, so they are planning a CT abd/pelvis to further evaluate pain o Patient reports that she was told that Voltaren can cause diarrhea, so she decided not to apply it. Discussed this w/ Cherylynn Ridges; they recommended that she try this medication for back pain o Notes that she started dicyclomine this past Saturday; doesn't feel like it has provided significant benefit yet. . Current blood glucose readings: Fasting <130; post prandial still at goal <180 . Cardiovascular risk reduction- followed extensively by heart failure clinic/paramedicine program; last paramedicine home visit 9/23;  o Current hypertensive/CHF regimen: amlodipine 10 mg daily, digoxin 0.125 mg daily, Opsumit 10 mg daily, Uptravi 600 mcg BID, Revatio 20 mg TID o Current hyperlipidemia regimen: rosuvastatin 10 mg daily; LDL at goal <70 on last check  Pharmacist Clinical Goal(s):  [name] Over the next 90 days, patient with work with PharmD and primary care provider to address optimized medication management  Interventions: . Discussed that diarrhea is a very infrequent side effect of topical diclofenac. Recommended she try applying at least BID for 5-7 days to see if if provides any pain relief benefit; if not, the  underlying pain is unlikely to benefit from the anti-inflammatory affect. She verbalized understanding . Praised for continued maintenance of controlled blood glucose. Will discuss 3570 re-application for [name] assistance moving forward, once [employer] releases criteria for 2021.  Patient Self Care Activities:  . Patient will check blood glucose BID, document, and provide at future appointments . Patient will focus on medication adherence by using pill boxes filled by paramedicine . Patient will report any questions or concerns to provider   Please see past updates related to this goal by clicking on the "Past Updates" button in the selected goal         Print copy of patient instructions provided.   Plan: - As patient receives frequent home visits from paramedicine, will push out my follow up phone call to another 6-8 weeks for continued medication management support  [name], PharmD, [place] Pharmacist [facility] [place], PharmD, [place] Pharmacist [facility] [place] [phone]

## 2019-04-05 ENCOUNTER — Other Ambulatory Visit: Payer: Self-pay | Admitting: Student

## 2019-04-05 DIAGNOSIS — R197 Diarrhea, unspecified: Secondary | ICD-10-CM | POA: Diagnosis not present

## 2019-04-05 DIAGNOSIS — K529 Noninfective gastroenteritis and colitis, unspecified: Secondary | ICD-10-CM | POA: Diagnosis not present

## 2019-04-07 ENCOUNTER — Other Ambulatory Visit: Payer: Self-pay | Admitting: Student

## 2019-04-07 DIAGNOSIS — G8929 Other chronic pain: Secondary | ICD-10-CM

## 2019-04-07 DIAGNOSIS — R1011 Right upper quadrant pain: Secondary | ICD-10-CM

## 2019-04-07 DIAGNOSIS — K529 Noninfective gastroenteritis and colitis, unspecified: Secondary | ICD-10-CM

## 2019-04-14 ENCOUNTER — Other Ambulatory Visit (HOSPITAL_COMMUNITY): Payer: Self-pay | Admitting: Cardiology

## 2019-04-14 ENCOUNTER — Ambulatory Visit
Admission: RE | Admit: 2019-04-14 | Discharge: 2019-04-14 | Disposition: A | Discharge status: Home / Self Care | Payer: Medicare HMO | Source: Ambulatory Visit | Attending: Student | Admitting: Student

## 2019-04-14 ENCOUNTER — Other Ambulatory Visit: Payer: Self-pay | Admitting: Family Medicine

## 2019-04-14 ENCOUNTER — Other Ambulatory Visit: Payer: Self-pay

## 2019-04-14 ENCOUNTER — Telehealth (HOSPITAL_COMMUNITY): Payer: Self-pay

## 2019-04-14 DIAGNOSIS — K5730 Diverticulosis of large intestine without perforation or abscess without bleeding: Secondary | ICD-10-CM | POA: Diagnosis not present

## 2019-04-14 DIAGNOSIS — R109 Unspecified abdominal pain: Secondary | ICD-10-CM | POA: Diagnosis not present

## 2019-04-14 DIAGNOSIS — R1011 Right upper quadrant pain: Secondary | ICD-10-CM | POA: Insufficient documentation

## 2019-04-14 DIAGNOSIS — K529 Noninfective gastroenteritis and colitis, unspecified: Secondary | ICD-10-CM | POA: Insufficient documentation

## 2019-04-14 DIAGNOSIS — G8929 Other chronic pain: Secondary | ICD-10-CM | POA: Insufficient documentation

## 2019-04-18 NOTE — Telephone Encounter (Signed)
Today Debra Saunders contacted me and advised she will not be home for appt today.  We had a telephone appt instead.  She states she has a ultrasound today for her nausea and diarrhea.  She states the medication they gave her is helping with the nausea and diarrhea.  She is still having the pain in her side.  Her appetite has been ok, eating a little but more.  She has all her medications and she refilled her med boxes.  Meds verified. She denies falls, chest pain, headaches or dizziness.  She has shortness of breath with activity which is her normal. Her swelling she states is good.  She watches her diet and fluid intake.  Will continue to visit for heart failure and medication compliance.   [place] [phone]

## 2019-04-19 NOTE — Progress Notes (Signed)
Today was a home visit with Debra Saunders.  She states she is still having diarrhea and nausea.  She still has the bad pain in side.  She is being followed by her doctor for these problems.  Her weight is down, she is not eating a lot. Advised her to make sure she eats something with her meds.  Meds were verified and med box refilled.  She has no swelling in lower extremitiy or does her abdomen feels tight.  Lungs are clear. Denies chest pain, shortness of breath at rest, headaches or dizziness..  Will continue to visit for heart failure.    Y[place] [employer] EMT-Paramedic [phone]

## 2019-04-22 ENCOUNTER — Encounter (HOSPITAL_COMMUNITY): Payer: Medicare HMO | Admitting: Cardiology

## 2019-04-22 ENCOUNTER — Ambulatory Visit (HOSPITAL_COMMUNITY): Payer: Medicare HMO

## 2019-04-28 ENCOUNTER — Other Ambulatory Visit (HOSPITAL_COMMUNITY): Payer: Self-pay | Admitting: *Deleted

## 2019-04-28 ENCOUNTER — Other Ambulatory Visit (HOSPITAL_COMMUNITY): Payer: Self-pay

## 2019-04-28 MED ORDER — SPIRONOLACTONE 25 MG PO TABS: 25.0000 mg | ORAL_TABLET | Freq: Every day | ORAL | 6 refills | Status: DC

## 2019-04-29 ENCOUNTER — Encounter (HOSPITAL_COMMUNITY): Payer: Self-pay

## 2019-04-29 NOTE — Progress Notes (Signed)
Today had a visit with Debra Saunders.  She states doing ok, still having pain in her side.  She has had several test done lately but have not figured out what is causing it.  Her weight is still coming down.  She states diarrhea is better with new medication.  Meds were verified and placed in her boxes.  She is out of spironolactone, called in for refill.  She denies chest pain, shortness of breath, headaches or dizziness.  Her swelling is down.  She states as far as heart failure she feels good.  She watches what she eats and drinks.  Will continue to visit for heart failure and medication compliance.   [place] [phone]

## 2019-05-12 ENCOUNTER — Other Ambulatory Visit (HOSPITAL_COMMUNITY): Payer: Self-pay

## 2019-05-12 ENCOUNTER — Other Ambulatory Visit: Payer: Self-pay | Admitting: Family Medicine

## 2019-05-12 NOTE — Progress Notes (Signed)
Today was a home visit with Debra Saunders.  She states doing ok, she has pain in her side and she has had several test and they can not find nothing.  She also states a few days ago she awoke and foot is hurting, right foot on outer side and top.  Top has some swelling and very tender to touch, not hot or red.  She states heat helps some.  She states she is not going to doctor yet, she is going to wait until after Thanksgiving.  Her weight is down.  She states everything else Is good.  Denies headaches, dizziness, shortness of breath at rest or chest pain.  She has all her medication and verified them.  Called in eliquis, gabapentin and uptravi.  [name] will be delivered tomorrow, gabapentin needs refills.  She states appetite is ok, still dont eat a lot.  She is going out of town this Thanksgiving to her sons house.  Will continue to visit for heart failure.   [place] [phone]

## 2019-05-16 ENCOUNTER — Other Ambulatory Visit: Payer: Self-pay | Admitting: Family Medicine

## 2019-05-16 ENCOUNTER — Other Ambulatory Visit (HOSPITAL_COMMUNITY): Payer: Self-pay | Admitting: Cardiology

## 2019-05-16 DIAGNOSIS — I270 Primary pulmonary hypertension: Secondary | ICD-10-CM

## 2019-05-16 MED ORDER — GABAPENTIN 100 MG PO CAPS: 200.0000 mg | ORAL_CAPSULE | Freq: Three times a day (TID) | ORAL | 0 refills | Status: DC

## 2019-05-16 NOTE — Telephone Encounter (Signed)
Medication: gabapentin (NEURONTIN) 100 MG capsule [[id number]] - can this be resent please? The pharmacy has not received it  Has the patient contacted their pharmacy? Yes  (Agent: If no, request that the patient contact the pharmacy for the refill.) (Agent: If yes, when and what did the pharmacy advise?)  Preferred Pharmacy (with phone number or street name): [facility] ([facility] [place] [address] [place] [phone] (Phone) [phone] (Fax)    Agent: Please be advised that RX refills may take up to 3 business days. We ask that you follow-up with your pharmacy.

## 2019-05-26 ENCOUNTER — Encounter (HOSPITAL_COMMUNITY): Payer: Self-pay

## 2019-05-26 ENCOUNTER — Other Ambulatory Visit (HOSPITAL_COMMUNITY): Payer: Self-pay

## 2019-05-26 ENCOUNTER — Other Ambulatory Visit (HOSPITAL_COMMUNITY): Payer: Self-pay | Admitting: Cardiology

## 2019-05-26 NOTE — Progress Notes (Signed)
Today had a home visit with [name].  She took a vacation for first time in awhile to her sons house over Thanksgiving.  She states did have increase in leg swelling but they have went down since back home.  She states her side is feeling better.  Today she had some diarrhea, she is sending another sample to Dr Milbert Coulter for further testing.   She states she may be dehydrated, did not drink much water past few days, advised her to up her water to 60 ounces, she has not drank any today (its 2:00).  Meds verified and med box refilled.  Called in digoxin and she needs new referral for assistance for sildinafil.  Contacted Dr Raul Del and made her appt for that.  She denies chest pain, shortness of breath, headaches or dizziness.  Her weight is still trending down, she still states her appetite is not good, she tries to eat 2 times a day and atleast something when she takes her meds.  Will continue to visit for heart failure, med compliance and diet.   [place] [phone]

## 2019-05-27 ENCOUNTER — Other Ambulatory Visit: Payer: Self-pay | Admitting: Family Medicine

## 2019-05-27 DIAGNOSIS — G8929 Other chronic pain: Secondary | ICD-10-CM | POA: Diagnosis not present

## 2019-05-27 DIAGNOSIS — R1011 Right upper quadrant pain: Secondary | ICD-10-CM | POA: Diagnosis not present

## 2019-05-31 ENCOUNTER — Encounter (HOSPITAL_COMMUNITY): Payer: Self-pay

## 2019-05-31 ENCOUNTER — Ambulatory Visit (INDEPENDENT_AMBULATORY_CARE_PROVIDER_SITE_OTHER): Payer: Medicare HMO | Admitting: *Deleted

## 2019-05-31 DIAGNOSIS — I495 Sick sinus syndrome: Secondary | ICD-10-CM | POA: Diagnosis not present

## 2019-05-31 LAB — CUP PACEART REMOTE DEVICE CHECK
Battery Remaining Longevity: 108 mo
Battery Remaining Percentage: 100 %
Brady Statistic RA Percent Paced: 73 %
Brady Statistic RV Percent Paced: 19 %
Date Time Interrogation Session: 20201208032100
Implantable Lead Implant Date: 20150720
Implantable Lead Implant Date: 20150720
Implantable Lead Location: 753859
Implantable Lead Location: 753860
Implantable Lead Model: 5076
Implantable Lead Model: 5076
Implantable Pulse Generator Implant Date: 20150720
Lead Channel Impedance Value: 351 Ohm
Lead Channel Impedance Value: 460 Ohm
Lead Channel Pacing Threshold Amplitude: 0.7 V
Lead Channel Pacing Threshold Pulse Width: 0.4 ms
Lead Channel Setting Pacing Amplitude: 2 V
Lead Channel Setting Pacing Amplitude: 2.4 V
Lead Channel Setting Pacing Pulse Width: 0.4 ms
Lead Channel Setting Sensing Sensitivity: 2.5 mV
Pulse Gen Serial Number: 390330

## 2019-05-31 NOTE — Progress Notes (Signed)
Spoke with Heart Failure clinic about Sildinafil for Debra Saunders, she has about 1 month supply left and needs new enrollment in to [employer].  They advised fax the paper work and they will take care of it.  Faxed today paper work that [place] had received from them.   [place] [phone]

## 2019-06-08 ENCOUNTER — Telehealth (HOSPITAL_COMMUNITY): Payer: Self-pay | Admitting: Pharmacy Technician

## 2019-06-08 NOTE — Telephone Encounter (Signed)
Received a message stating patient needed a prior authorization on Sildenafil. When the prior was attempted, insurance came back stating that there was already an authorization on file. Co-pay was $3.60.  No further action is required at this time.  Charlann Boxer, CPhT

## 2019-06-09 ENCOUNTER — Other Ambulatory Visit (HOSPITAL_COMMUNITY): Payer: Self-pay

## 2019-06-09 ENCOUNTER — Other Ambulatory Visit: Payer: Self-pay | Admitting: Family Medicine

## 2019-06-09 ENCOUNTER — Encounter (HOSPITAL_COMMUNITY): Payer: Self-pay

## 2019-06-09 NOTE — Progress Notes (Signed)
Today had a home visit with Debra Saunders.  She states doing ok.  She has hurt her back moving a box, she is able to move around slowly but really sore.  She has been resting and applying a heat pad.  Advised her if no better to get checked out at [facility] urgent care.  She says she will wait a few days.  She states her appetite has been increasing.  Her weight is staying the same for last few weeks.  Swelling is good.  Denies any chest pain, shortness of breath at rest, headaches or dizziness.  Meds verified and med box refilled.  Called in refills for uptravi, gabapentin and crestor.  Contacted HF clinic about [name] and Nira Conn will send to Pharmacy about renewing it because it ends the end of year.  Will continue to visit for heart failure.    [place] [phone]

## 2019-06-13 ENCOUNTER — Other Ambulatory Visit (HOSPITAL_COMMUNITY): Payer: Self-pay

## 2019-06-13 MED ORDER — UPTRAVI 200 MCG PO TABS: 800.0000 ug | ORAL_TABLET | Freq: Two times a day (BID) | ORAL | 3 refills | Status: DC

## 2019-06-15 ENCOUNTER — Ambulatory Visit (INDEPENDENT_AMBULATORY_CARE_PROVIDER_SITE_OTHER): Payer: Medicare HMO | Admitting: Pharmacist

## 2019-06-15 DIAGNOSIS — E119 Type 2 diabetes mellitus without complications: Secondary | ICD-10-CM

## 2019-06-15 DIAGNOSIS — I5022 Chronic systolic (congestive) heart failure: Secondary | ICD-10-CM | POA: Diagnosis not present

## 2019-06-15 DIAGNOSIS — Z794 Long term (current) use of insulin: Secondary | ICD-10-CM

## 2019-06-15 NOTE — Patient Instructions (Signed)
Visit Information  Goals Addressed            This Visit's Progress     Patient Stated   . "I want to stay healthy" (pt-stated)       Current Barriers:  . Diabetes: controlled; most recent A1c 6.2% o Paramedicine EMT [name] continues to follow regularly to help fill pill box and collaborate w/ CHF clinic.  . Current antihyperglycemic regimen: metformin 115 mg BID, [name] 5.20 mg once week o Receiving [employer] patient assistance for [name]. Notes she received a 4 month supply of medication a few days ago.  . Current blood glucose readings: Notes they continue to be well controlled with fasting <130; post prandial <180 . Cardiovascular risk reduction- followed extensively by heart failure clinic/paramedicine program; last paramedicine home visit 06/09/2019 o Current hypertensive/CHF regimen: amlodipine 10 mg daily, digoxin 0.125 mg daily, Opsumit 10 mg daily, Uptravi 600 mcg BID, Revatio 20 mg TID o Current hyperlipidemia regimen: rosuvastatin 10 mg daily; LDL at goal <70 on last check  Pharmacist Clinical Goal(s):  [name] Over the next 90 days, patient with work with PharmD and primary care provider to address optimized medication management  Interventions: . Reviewed need to reapply for [facility] assistance for 2021. As patient just received a 4 month supply, will defer reapplication process until next follow up call scheduled in February.  . Reviewed goal A1c, goal fasting glucose, goal 2 hour post prandial glucose . Reviewed upcoming AWV and visit w/ PCP.   Patient Self Care Activities:  . Patient will check blood glucose BID, document, and provide at future appointments . Patient will focus on medication adherence by using pill boxes filled by paramedicine . Patient will report any questions or concerns to provider   Please see past updates related to this goal by clicking on the "Past Updates" button in the selected goal         The patient verbalized understanding  of instructions provided today and declined a print copy of patient instruction materials.   Plan: - Scheduled follow up call on 07/25/19 at 1 pm  [name], PharmD, [place], [place] [phone]

## 2019-06-15 NOTE — Chronic Care Management (AMB) (Signed)
Chronic Care Management   Follow Up Note   06/15/2019 Name: Debra Saunders MRN: [id number] DOB: [birth year]  Referred by: [name], MD Reason for referral : Chronic Care Management (Medication Management)   Debra Saunders is a 73 y.o. year old female who is a primary care patient of [name], [name], MD. The CCM team was consulted for assistance with chronic disease management and care coordination needs.    Contacted patient for medication access review.   Review of patient status, including review of consultants reports, relevant laboratory and other test results, and collaboration with appropriate care team members and the patient's provider was performed as part of comprehensive patient evaluation and provision of chronic care management services.    SDOH (Social Determinants of Health) screening performed today: Financial Strain . See Care Plan for related entries.   Outpatient Encounter Medications as of 06/15/2019  Medication Sig  . Accu-Chek Softclix Lancets lancets TEST UP TO FOUR TIMES DAILY AS DIRECTED  . acetaminophen (TYLENOL) 325 MG tablet Take 325-650 mg by mouth every 6 (six) hours as needed for mild pain. Reported on 10/10/2015  . albuterol (PROVENTIL) (2.5 MG/3ML) 0.083% nebulizer solution Take 3 mLs (2.5 mg total) by nebulization every 6 (six) hours.  [name] amLODipine (NORVASC) 10 MG tablet Take 1 tablet (10 mg total) by mouth daily.  [name] apixaban (ELIQUIS) 5 MG TABS tablet Take 1 tablet (5 mg total) by mouth 2 (two) times daily.  . Blood Glucose Monitoring Suppl (ACCU-CHEK AVIVA PLUS) w/Device KIT USE AS DIRECTED  . cyanocobalamin 1000 MCG tablet Take 1,000 mcg by mouth daily. Daily in am  . diclofenac sodium (VOLTAREN) 1 % GEL Apply 2 g topically 4 (four) times daily as needed.  . dicyclomine (BENTYL) 20 MG tablet Take 20 mg by mouth every 6 (six) hours.  . digoxin (LANOXIN) 0.125 MG tablet Take 1 tablet by mouth once daily  . Dulaglutide ([name]) 1.21  FX/5.8IT SOPN INJECT 0.75MG SUBCUTANEOUSLY ONCE A WEEK  . gabapentin (NEURONTIN) 100 MG capsule TAKE 2 CAPSULES BY MOUTH THREE TIMES DAILY  . glucose blood (TRUE METRIX BLOOD GLUCOSE TEST) test strip Check blood glucose twice daily Dx:E11.8, Z79.4  . glucose blood test strip Use as instructed  . meclizine (ANTIVERT) 25 MG tablet Take 1 tablet (25 mg total) by mouth 3 (three) times daily as needed for dizziness. (Patient not taking: Reported on 06/09/2019)  . Menthol, Topical Analgesic, (BIOFREEZE EX) Apply 1 application topically as needed (for pain).   . metFORMIN (GLUCOPHAGE) 500 MG tablet Take 1 tablet (500 mg total) by mouth 2 (two) times daily with a meal.  . ondansetron (ZOFRAN) 8 MG tablet Take 1 tablet (8 mg total) by mouth every 8 (eight) hours as needed for nausea or vomiting. (Patient not taking: Reported on 04/04/2019)  . OPSUMIT 10 MG tablet TAKE 1 TABLET BY MOUTH EVERY DAY  . OXYGEN Inhale 4 L into the lungs continuous.  . potassium chloride SA (K-DUR) 20 MEQ tablet Take 1 tablet (20 mEq total) by mouth daily.  . rosuvastatin (CRESTOR) 10 MG tablet Take 1 tablet (10 mg total) by mouth daily.  . Selexipag (UPTRAVI) 200 MCG TABS Take 4 tablets (800 mcg total) by mouth 2 (two) times daily. Started 800 mcg Nov 5th.  . sildenafil (REVATIO) 20 MG tablet Take 20 mg by mouth 3 (three) times daily.  [name] spironolactone (ALDACTONE) 25 MG tablet Take 1 tablet (25 mg total) by mouth at bedtime.  . torsemide (DEMADEX)  20 MG tablet TAKE 3 TABLETS BY MOUTH TWICE DAILY   No facility-administered encounter medications on file as of 06/15/2019.     Goals Addressed            This Visit's Progress     Patient Stated   . "I want to stay healthy" (pt-stated)       Current Barriers:  . Diabetes: controlled; most recent A1c 6.2% o Paramedicine EMT [name] continues to follow regularly to help fill pill box and collaborate w/ CHF clinic.  . Current antihyperglycemic regimen: metformin 996  mg BID, [name] 8.95 mg once week o Receiving [employer] patient assistance for [name]. Notes she received a 4 month supply of medication a few days ago.  . Current blood glucose readings: Notes they continue to be well controlled with fasting <130; post prandial <180 . Cardiovascular risk reduction- followed extensively by heart failure clinic/paramedicine program; last paramedicine home visit 06/09/2019 o Current hypertensive/CHF regimen: amlodipine 10 mg daily, digoxin 0.125 mg daily, Opsumit 10 mg daily, Uptravi 600 mcg BID, Revatio 20 mg TID o Current hyperlipidemia regimen: rosuvastatin 10 mg daily; LDL at goal <70 on last check  Pharmacist Clinical Goal(s):  [name] Over the next 90 days, patient with work with PharmD and primary care provider to address optimized medication management  Interventions: . Reviewed need to reapply for [facility] assistance for 2021. As patient just received a 4 month supply, will defer reapplication process until next follow up call scheduled in February.  . Reviewed goal A1c, goal fasting glucose, goal 2 hour post prandial glucose . Reviewed upcoming AWV and visit w/ PCP.   Patient Self Care Activities:  . Patient will check blood glucose BID, document, and provide at future appointments . Patient will focus on medication adherence by using pill boxes filled by paramedicine . Patient will report any questions or concerns to provider   Please see past updates related to this goal by clicking on the "Past Updates" button in the selected goal          Plan: - Scheduled follow up call on 07/25/19 at 1 pm  [name], PharmD, [place], [place] Pharmacist [place] [phone]

## 2019-06-16 ENCOUNTER — Other Ambulatory Visit (HOSPITAL_COMMUNITY): Payer: Self-pay

## 2019-06-16 MED ORDER — UPTRAVI 200 MCG PO TABS: 800.0000 ug | ORAL_TABLET | Freq: Two times a day (BID) | ORAL | 3 refills | Status: DC

## 2019-06-16 NOTE — Telephone Encounter (Signed)
Rx for Uptravi signed and faxed to [name] for titration refills.  To be titrated by 260mcg bid every 1-4 weeks as tolerated to max dose of  1600 mcg bid

## 2019-06-29 ENCOUNTER — Telehealth (HOSPITAL_COMMUNITY): Payer: Self-pay | Admitting: Pharmacist

## 2019-06-29 NOTE — Telephone Encounter (Signed)
Patient Advocate Encounter   Received notification from [facility] that prior authorization for Opsumit is required.   PA submitted on CoverMyMeds Key [id number] Status is pending   Will continue to follow.  Audry Riles, PharmD, BCPS, BCCP, CPP Heart Failure Clinic Pharmacist [phone]

## 2019-06-30 NOTE — Telephone Encounter (Signed)
Advanced Heart Failure Patient Advocate Encounter  Prior Authorization for Opsumit has been approved.    Effective dates: 06/30/19 through 06/22/20  Audry Riles, PharmD, BCPS, BCCP, CPP Heart Failure Clinic Pharmacist [phone]

## 2019-07-01 NOTE — Progress Notes (Signed)
PPM remote 

## 2019-07-02 ENCOUNTER — Other Ambulatory Visit (HOSPITAL_COMMUNITY): Payer: Self-pay | Admitting: Cardiology

## 2019-07-02 DIAGNOSIS — E785 Hyperlipidemia, unspecified: Secondary | ICD-10-CM

## 2019-07-05 ENCOUNTER — Ambulatory Visit: Payer: Medicare HMO

## 2019-07-05 ENCOUNTER — Ambulatory Visit: Payer: Medicare HMO | Admitting: Family Medicine

## 2019-07-06 ENCOUNTER — Encounter: Payer: Self-pay | Admitting: Family Medicine

## 2019-07-06 ENCOUNTER — Ambulatory Visit (INDEPENDENT_AMBULATORY_CARE_PROVIDER_SITE_OTHER): Payer: Medicare HMO | Admitting: Family Medicine

## 2019-07-06 ENCOUNTER — Other Ambulatory Visit: Payer: Self-pay

## 2019-07-06 VITALS — Ht 60.0 in | Wt 180.0 lb

## 2019-07-06 DIAGNOSIS — R42 Dizziness and giddiness: Secondary | ICD-10-CM

## 2019-07-06 DIAGNOSIS — J8410 Pulmonary fibrosis, unspecified: Secondary | ICD-10-CM

## 2019-07-06 DIAGNOSIS — I480 Paroxysmal atrial fibrillation: Secondary | ICD-10-CM | POA: Diagnosis not present

## 2019-07-06 DIAGNOSIS — Z794 Long term (current) use of insulin: Secondary | ICD-10-CM

## 2019-07-06 DIAGNOSIS — J324 Chronic pansinusitis: Secondary | ICD-10-CM | POA: Insufficient documentation

## 2019-07-06 DIAGNOSIS — E119 Type 2 diabetes mellitus without complications: Secondary | ICD-10-CM | POA: Diagnosis not present

## 2019-07-06 DIAGNOSIS — R809 Proteinuria, unspecified: Secondary | ICD-10-CM

## 2019-07-06 NOTE — Progress Notes (Signed)
Virtual Visit via telephone Note  This visit type was conducted due to national recommendations for restrictions regarding the COVID-19 pandemic (e.g. social distancing).  This format is felt to be most appropriate for this patient at this time.  All issues noted in this document were discussed and addressed.  No physical exam was performed (except for noted visual exam findings with Video Visits).   I connected with Debra Saunders today at  2:15 PM EST by telephone and verified that I am speaking with the correct person using two identifiers. Location patient: home Location provider: work Persons participating in the virtual visit: patient, provider  I discussed the limitations, risks, security and privacy concerns of performing an evaluation and management service by telephone and the availability of in person appointments. I also discussed with the patient that there may be a patient responsible charge related to this service. The patient expressed understanding and agreed to proceed.  Interactive audio and video telecommunications were attempted between this provider and patient, however failed, due to patient having technical difficulties OR patient did not have access to video capability.  We continued and completed visit with audio only.   Reason for visit: follow-up  HPI: DIABETES Disease Monitoring: Blood Sugar ranges-around 100 Polyuria/phagia/dipsia-some polydipsia Medications: Compliance- taking trulicity and metformin, she would like to decrease the metformin to once a day due to GI upset Hypoglycemic symptoms- no  Lightheadedness/A. fib: Patient notes this is going on for the last couple of weeks.  Notes it does not occur daily though does occur at least once a week.  Notes it comes on suddenly.  Can occur when she is trying to cook and walks towards the stove.  She sits down and it does take a little while to improve though does improve.  She also notes it has occurred when  she is just sitting there.  She has had no syncope.  No chest pain.  No shortness of breath.  No palpitations.  She does have a pacemaker and notes she was advised it was last set at 7.  She notes on a couple of occasions her heart rate was around 58 though once she got a reading of 48.  BP has been around 117/68 though a couple weeks ago was down in the 90s over 60s.  She does take amlodipine.  She notes she has not seen her EP cardiologist in some time.  She is on Eliquis.  She has had no bleeding issues.  Chronic wheezing: Patient notes chronic intermittent wheezing at night.  This has been going on for years.  She take a breathing treatment and that will help.  Does produce some phlegm.   ROS: See pertinent positives and negatives per HPI.  Past Medical History:  Diagnosis Date  . Arthritis   . Atrial fibrillation, persistent ([place])    a. s/p TEE-guided DCCV on 08/20/2017  . Cardiorenal syndrome    a. 09/2017 Creat rose to 2.7 w/ diuresis-->HD x 2.  . Chest pain    a. 01/2012 Cath: nonobs dzs;  c. 04/2014 MV: No ischemia.  . Chronic back pain   . Chronic combined systolic (congestive) and diastolic (congestive) heart failure ([place])    a. Dx 2005 @ [facility];  b. 02/2014 Echo: Ef 55-60%, no rwma, mild MR, PASP 21mHg. c. 07/2017: EF 30-35%, diffuse HK, trivial AI, severe MR, moderate TR; d. 09/2017 Echo: EF 40-45%, ant, antsep, ap, basal HK; e. 02/2018 Echo: EF 55-60%, Gr1 DD. Ao sclerosis w/o stenosis.  Mild to mod MR. Mildly dil LA. Mildly reduced RV fxn. Mild TR. PASP 35-33mHg.  .Marland KitchenChronic respiratory failure (HCC)    a. on home O2  . COPD (chronic obstructive pulmonary disease) ([facility]    a. Home O2 - 3lpm  . Gastritis   . History of intracranial hemorrhage    a. 6/14 described by neurosurgery as "small frontal posttraumatic SAH " - pt was on xarelto @ the time.  . Hyperlipidemia   . Hypertension   . Lipoma of back   . Lymphedema    a. Chronic LLE edema.  .Marland KitchenNICM (nonischemic cardiomyopathy)  ([facility]    a. 09/2017 Echo: EF 40-45%; b. 09/2017 Cath: minor, insignificant CAD; c. 02/2018 Echo: EF 55-60%, Gr1 DD.  .Marland KitchenObesity   . PAH (pulmonary artery hypertension) ([facility]    a. 09/2017 Echo: PASP 632mg; b. 09/2017 RHC: RA 23, RV 84/13, PA 84/29, PCWP 20, CO 3.89, CI 1.89. LVEDP 18.  . Sepsis with metabolic encephalopathy ([facility]  . Sleep apnea   . Streptococcal bacteremia   . Stroke ([facility]  . Subarachnoid hemorrhage (HCR[place]land7/30/2014  . Symptomatic bradycardia    a. s/p BSX PPM.  . Thyroid nodule   . Type II diabetes mellitus ([facility]boy[place]  . Urinary incontinence     Past Surgical History:  Procedure Laterality Date  . ABDOMINAL HYSTERECTOMY  1969  . [place]IAC [place]HE[place]IZATION  [place]   [place] Only 20% ostial left circumflex.  . Marland KitchenARDIOVERSION N/A 10/12/2017   Procedure: CARDIOVERSION;  Surgeon: GoMinna MerrittsMD;  Location: [facility];  Service: Cardiovascular;  Laterality: N/A;  . CHOLECYSTECTOMY    . COLONOSCOPY WITH PROPOFOL N/A 09/10/2018   Procedure: COLONOSCOPY WITH PROPOFOL;  Surgeon: ElManya SilvasMD;  [facility];  Service: Endoscopy;  Laterality: N/A;  . CYST REMOVAL TRUNK  2002   BACK  . DIALYSIS/PERMA CATHETER INSERTION N/A 10/06/2017   Procedure: DIALYSIS/PERMA CATHETER INSERTION;  Surgeon: ScKatha CabalMD;  [facility];  Service: Cardiovascular;  Laterality: N/A;  . INSERT / REPLACE / REMOVE PACEMAKER    . lipoma removal  2014   back  . PACEMAKER INSERTION  [address]cientific dual chamber pacemaker implanted by Dr KlCaryl Comesor symptomatic bradycardia  . PERMANENT PACEMAKER INSERTION N/A 01/09/2014   Procedure: PERMANENT PACEMAKER INSERTION;  Surgeon: StDeboraha SprangMD;  [facility];  Service: Cardiovascular;  Laterality: N/A;  . RIGHT HEART CATH N/A 04/19/2018   Procedure: RIGHT HEART CATH;  Surgeon: McLarey DresserMD;  [facility];  Service: Cardiovascular;  Laterality: N/A;  .  RIGHT/LEFT HEART CATH AND CORONARY ANGIOGRAPHY N/A 10/19/2017   Procedure: RIGHT/LEFT HEART CATH AND CORONARY ANGIOGRAPHY;  Surgeon: ArWellington HampshireMD;  [facility];  Service: Cardiovascular;  Laterality: N/A;  . TEE WITHOUT CARDIOVERSION N/A 08/20/2017   Procedure: TRANSESOPHAGEAL ECHOCARDIOGRAM (TEE) & Direct current cardioversion;  Surgeon: GoMinna MerrittsMD;  Location: [facility] ORS;  Service: Cardiovascular;  Laterality: N/A;  . TRIGGER FINGER RELEASE    . VAGINAL HYSTERECTOMY      Family History  Problem Relation Age of Onset  . Heart disease Mother   . Hypertension Mother   . COPD Mother        was a smoker  . Thyroid disease Mother   . Hypertension Father   . Hypertension Sister   . Thyroid disease Sister   .  Hypertension Sister   . Thyroid disease Sister   . Breast cancer Maternal Aunt   . Kidney disease Neg Hx   . Bladder Cancer Neg Hx     SOCIAL HX: Former smoker   Current Outpatient Medications:  .  Accu-Chek Softclix Lancets lancets, TEST UP TO FOUR TIMES DAILY AS DIRECTED, Disp: 400 each, Rfl: 0 .  acetaminophen (TYLENOL) 325 MG tablet, Take 325-650 mg by mouth every 6 (six) hours as needed for mild pain. Reported on 10/10/2015, Disp: , Rfl:  .  albuterol (PROVENTIL) (2.5 MG/3ML) 0.083% nebulizer solution, Take 3 mLs (2.5 mg total) by nebulization every 6 (six) hours., Disp: 75 mL, Rfl: 12 .  amLODipine (NORVASC) 10 MG tablet, Take 1 tablet (10 mg total) by mouth daily., Disp: 30 tablet, Rfl: 6 .  apixaban (ELIQUIS) 5 MG TABS tablet, Take 1 tablet (5 mg total) by mouth 2 (two) times daily., Disp: 60 tablet, Rfl: 11 .  Blood Glucose Monitoring Suppl (ACCU-CHEK AVIVA PLUS) w/Device KIT, USE AS DIRECTED, Disp: 1 kit, Rfl: 0 .  cyanocobalamin 1000 MCG tablet, Take 1,000 mcg by mouth daily. Daily in am, Disp: , Rfl:  .  diclofenac sodium (VOLTAREN) 1 % GEL, Apply 2 g topically 4 (four) times daily as needed., Disp: 50 g, Rfl: 0 .  dicyclomine (BENTYL)  20 MG tablet, Take 20 mg by mouth every 6 (six) hours., Disp: , Rfl:  .  digoxin (LANOXIN) 0.125 MG tablet, Take 1 tablet by mouth once daily, Disp: 90 tablet, Rfl: 0 .  Dulaglutide ([name]) 6.64 QI/3.4VQ SOPN, INJECT 0.75MG SUBCUTANEOUSLY ONCE A WEEK, Disp: 4 pen, Rfl: 2 .  gabapentin (NEURONTIN) 100 MG capsule, TAKE 2 CAPSULES BY MOUTH THREE TIMES DAILY, Disp: 180 capsule, Rfl: 0 .  glucose blood (TRUE METRIX BLOOD GLUCOSE TEST) test strip, Check blood glucose twice daily Dx:E11.8, Z79.4, Disp: 100 each, Rfl: 12 .  glucose blood test strip, Use as instructed, Disp: 100 each, Rfl: 12 .  meclizine (ANTIVERT) 25 MG tablet, Take 1 tablet (25 mg total) by mouth 3 (three) times daily as needed for dizziness., Disp: 30 tablet, Rfl: 0 .  Menthol, Topical Analgesic, (BIOFREEZE EX), Apply 1 application topically as needed (for pain). , Disp: , Rfl:  .  metFORMIN (GLUCOPHAGE) 500 MG tablet, Take 1 tablet (500 mg total) by mouth 2 (two) times daily with a meal., Disp: 180 tablet, Rfl: 1 .  ondansetron (ZOFRAN) 8 MG tablet, Take 1 tablet (8 mg total) by mouth every 8 (eight) hours as needed for nausea or vomiting., Disp: 30 tablet, Rfl: 2 .  OPSUMIT 10 MG tablet, TAKE 1 TABLET BY MOUTH EVERY DAY, Disp: 30 tablet, Rfl: 3 .  OXYGEN, Inhale 4 L into the lungs continuous., Disp: , Rfl:  .  potassium chloride SA (K-DUR) 20 MEQ tablet, Take 1 tablet (20 mEq total) by mouth daily., Disp: 90 tablet, Rfl: 3 .  rosuvastatin (CRESTOR) 10 MG tablet, Take 1 tablet by mouth once daily, Disp: 90 tablet, Rfl: 0 .  Selexipag (UPTRAVI) 200 MCG TABS, Take 4 tablets (800 mcg total) by mouth 2 (two) times daily. Rx for titration doses faxed. Currently on 85mg BID, Disp: 112 tablet, Rfl: 3 .  sildenafil (REVATIO) 20 MG tablet, Take 20 mg by mouth 3 (three) times daily., Disp: , Rfl:  .  spironolactone (ALDACTONE) 25 MG tablet, Take 1 tablet (25 mg total) by mouth at bedtime., Disp: 30 tablet, Rfl: 6 .  torsemide (DEMADEX) 20  MG tablet,  TAKE 3 TABLETS BY MOUTH TWICE DAILY, Disp: 540 tablet, Rfl: 0  EXAM:  VITALS per patient if applicable: BP 132/44, pulse 58  This was a telehealth telephone visit and thus no physical exam was completed  ASSESSMENT AND PLAN:  Discussed the following assessment and plan:  Lightheadedness Intermittent issue.  Not occurring daily.  Could be related to borderline low blood pressures.  We will have her decrease her amlodipine to 5 mg daily.  She will contact her cardiologist to let them know how she has been feeling and let them know what her pulse has been doing.  I will send her cardiologist a message as well.  Advised that if she develops any presyncope or any additional symptoms with this she needs to be evaluated in the emergency department.  We will check lab work as well to evaluate for other causes.  Atrial fibrillation (HCC) Continue Eliquis.  She will continue to see cardiology.  Diffuse Parenchymal Lung Disease Does have occasional wheezing at night.  She will continue as needed breathing treatments.  Diabetes ([place]) Very well controlled.  She can decrease her Metformin to 1 tablet daily.  She will continue on [name].  We will get lab work.   Orders Placed This Encounter  Procedures  . Comp Met (CMET)    Standing Status:   Future    Standing Expiration Date:   07/05/2020  . CBC    Standing Status:   Future    Standing Expiration Date:   07/05/2020  . Urine Microalbumin w/creat. ratio    Standing Status:   Future    Standing Expiration Date:   07/05/2020  . POCT urinalysis dipstick    Standing Status:   Future    Standing Expiration Date:   09/03/2019    No orders of the defined types were placed in this encounter.    I discussed the assessment and treatment plan with the patient. The patient was provided an opportunity to ask questions and all were answered. The patient agreed with the plan and demonstrated an understanding of the instructions.   The  patient was advised to call back or seek an in-person evaluation if the symptoms worsen or if the condition fails to improve as anticipated.  I provided 18 minutes of non-face-to-face time during this encounter.   [name], MD

## 2019-07-06 NOTE — Assessment & Plan Note (Signed)
Does have occasional wheezing at night.  She will continue as needed breathing treatments.

## 2019-07-06 NOTE — Assessment & Plan Note (Addendum)
Intermittent issue.  Not occurring daily.  Could be related to borderline low blood pressures.  We will have her decrease her amlodipine to 5 mg daily.  She will contact her cardiologist to let them know how she has been feeling and let them know what her pulse has been doing.  I will send her cardiologist a message as well.  Advised that if she develops any presyncope or any additional symptoms with this she needs to be evaluated in the emergency department.  We will check lab work as well to evaluate for other causes.

## 2019-07-06 NOTE — Assessment & Plan Note (Signed)
Continue Eliquis.  She will continue to see cardiology.

## 2019-07-06 NOTE — Assessment & Plan Note (Signed)
Very well controlled.  She can decrease her Metformin to 1 tablet daily.  She will continue on [name].  We will get lab work.

## 2019-07-08 ENCOUNTER — Other Ambulatory Visit (INDEPENDENT_AMBULATORY_CARE_PROVIDER_SITE_OTHER): Payer: Medicare HMO

## 2019-07-08 ENCOUNTER — Other Ambulatory Visit: Payer: Self-pay

## 2019-07-08 VITALS — Wt 182.5 lb

## 2019-07-08 DIAGNOSIS — R809 Proteinuria, unspecified: Secondary | ICD-10-CM | POA: Diagnosis not present

## 2019-07-08 DIAGNOSIS — Z794 Long term (current) use of insulin: Secondary | ICD-10-CM | POA: Diagnosis not present

## 2019-07-08 DIAGNOSIS — Z23 Encounter for immunization: Secondary | ICD-10-CM | POA: Diagnosis not present

## 2019-07-08 DIAGNOSIS — E119 Type 2 diabetes mellitus without complications: Secondary | ICD-10-CM

## 2019-07-08 DIAGNOSIS — R42 Dizziness and giddiness: Secondary | ICD-10-CM

## 2019-07-08 LAB — POCT URINALYSIS DIPSTICK
Bilirubin, UA: NEGATIVE
Blood, UA: NEGATIVE
Glucose, UA: NEGATIVE
Ketones, UA: NEGATIVE
Nitrite, UA: NEGATIVE
Protein, UA: NEGATIVE
Spec Grav, UA: 1.01 (ref 1.010–1.025)
Urobilinogen, UA: 0.2 E.U./dL
pH, UA: 5 (ref 5.0–8.0)

## 2019-07-08 NOTE — Addendum Note (Signed)
Addended by: Leeanne Rio on: 07/08/2019 02:24 PM   Modules accepted: Orders

## 2019-07-09 LAB — MICROALBUMIN / CREATININE URINE RATIO
Creatinine, Urine: 86 mg/dL (ref 20–275)
Microalb Creat Ratio: 7 mcg/mg creat (ref <30)
Microalb, Ur: 0.6 mg/dL

## 2019-07-09 LAB — COMPREHENSIVE METABOLIC PANEL
AG Ratio: 1.2 (calc) (ref 1.0–2.5)
ALT: 5 U/L — ABNORMAL LOW (ref 6–29)
AST: 11 U/L (ref 10–35)
Albumin: 4.1 g/dL (ref 3.6–5.1)
Alkaline phosphatase (APISO): 118 U/L (ref 37–153)
BUN/Creatinine Ratio: 19 (calc) (ref 6–22)
BUN: 26 mg/dL — ABNORMAL HIGH (ref 7–25)
CO2: 28 mmol/L (ref 20–32)
Calcium: 9.4 mg/dL (ref 8.6–10.4)
Chloride: 101 mmol/L (ref 98–110)
Creat: 1.39 mg/dL — ABNORMAL HIGH (ref 0.60–0.93)
Globulin: 3.3 g/dL (calc) (ref 1.9–3.7)
Glucose, Bld: 102 mg/dL — ABNORMAL HIGH (ref 65–99)
Potassium: 4.4 mmol/L (ref 3.5–5.3)
Sodium: 139 mmol/L (ref 135–146)
Total Bilirubin: 0.4 mg/dL (ref 0.2–1.2)
Total Protein: 7.4 g/dL (ref 6.1–8.1)

## 2019-07-09 LAB — CBC
HCT: 29.4 % — ABNORMAL LOW (ref 35.0–45.0)
Hemoglobin: 9.2 g/dL — ABNORMAL LOW (ref 11.7–15.5)
MCH: 24.6 pg — ABNORMAL LOW (ref 27.0–33.0)
MCHC: 31.3 g/dL — ABNORMAL LOW (ref 32.0–36.0)
MCV: 78.6 fL — ABNORMAL LOW (ref 80.0–100.0)
MPV: 11.3 fL (ref 7.5–12.5)
Platelets: 260 10*3/uL (ref 140–400)
RBC: 3.74 10*6/uL — ABNORMAL LOW (ref 3.80–5.10)
RDW: 13.1 % (ref 11.0–15.0)
WBC: 6.8 10*3/uL (ref 3.8–10.8)

## 2019-07-10 ENCOUNTER — Other Ambulatory Visit: Payer: Self-pay | Admitting: Family Medicine

## 2019-07-10 DIAGNOSIS — D509 Iron deficiency anemia, unspecified: Secondary | ICD-10-CM

## 2019-07-11 ENCOUNTER — Other Ambulatory Visit (HOSPITAL_COMMUNITY): Payer: Self-pay | Admitting: *Deleted

## 2019-07-11 ENCOUNTER — Other Ambulatory Visit: Payer: Self-pay | Admitting: Family Medicine

## 2019-07-11 ENCOUNTER — Other Ambulatory Visit (HOSPITAL_COMMUNITY): Payer: Self-pay

## 2019-07-11 ENCOUNTER — Telehealth: Payer: Self-pay | Admitting: Internal Medicine

## 2019-07-11 MED ORDER — SILDENAFIL CITRATE 20 MG PO TABS: 20.0000 mg | ORAL_TABLET | Freq: Three times a day (TID) | ORAL | 3 refills | Status: DC

## 2019-07-11 NOTE — Telephone Encounter (Signed)
Nurse calling with community paramedic states pt HR has been down 45 in the mornings. STtes she is having some dizziness this morning.  STAT if HR is under 50 or over 120 (normal HR is 60-100 beats per minute)  What is your heart rate? 56 Do you have a log of your heart rate readings (document readings)?  No daily log, just in the 40s some mornings.  Do you have any other symptoms? dizziness

## 2019-07-11 NOTE — Telephone Encounter (Signed)
Patient reports that she has been getting HR readings in 40's with pulse ox and home BP cuff. Lower rate set for 60 bpm. She reports intermittent SOB and substernal chest pressure that lasts a few seconds and occurs with activity and at rest. Attempted to send remote transmission but unable to send. Given number for  [employer] and will call office after speaking with teach services.

## 2019-07-11 NOTE — Progress Notes (Signed)
Today Jesyka states doing ok.  She has been getting dizzy and she states her heart rate has been as low as 45 and during the day in the 50's.  She can tell she will be dizzy.  She is checking it with pulse ox and on her blood pressure monitor.  Contacted cardiology for an appt and they advised they will send a message to monitoring.  They contacted her while I was there and had her to transmit it.  They will contact her back after getting it.   Swelling is down, she has been losing weight but slower now.  Mood is good.  Denies chest pain, headache, dizziness or shortness of breath.  Meds verified and placed in med box.  Gabapentin and eliquis called in for refills.  Contacted HF clinic and advised tried to get refills on sildenafil and will call in [place] tomorrow due to they was out of office.   Will continue to visit for heart failure, medication compliance and diet.   [place] [phone]

## 2019-07-12 NOTE — Telephone Encounter (Signed)
Reviewed transmission from 07/11/19. Normal PPM function. 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 1 true NSVT episode noted on 06/30/19, 9 beats duration. Histograms stable since last transmission on 05/31/19.  Spoke with patient. Advised that transmission reveals normal device function. Presenting rhythm indicates frequent PACs (not tracked due to falling in PVARP), known issue per Dr. Olin Pia OV note from 08/24/2018. Pt reports she has been having intermittent episodes of dizziness with her pulse ox/BP monitor reading HRs as low as the 40s-50s. PPM base rate programmed at 60bpm. Pt has seen Dr. [name] and he is running multiple tests per her report, but she has been advised to follow-up with our office, as well. Advised I will forward message to Dr. Caryl Comes for review and recommendations. Pt verbalizes understanding and agreement with plan.

## 2019-07-14 NOTE — Telephone Encounter (Signed)
Reviewed tracing w EM-- Looks like atrial  tachycardia at 2:1  If persistent symptoms can see in the office

## 2019-07-15 ENCOUNTER — Other Ambulatory Visit (INDEPENDENT_AMBULATORY_CARE_PROVIDER_SITE_OTHER): Payer: Medicare HMO

## 2019-07-15 ENCOUNTER — Other Ambulatory Visit: Payer: Self-pay

## 2019-07-15 DIAGNOSIS — D509 Iron deficiency anemia, unspecified: Secondary | ICD-10-CM

## 2019-07-15 LAB — IBC + FERRITIN
Ferritin: 11.2 ng/mL (ref 10.0–291.0)
Iron: 26 ug/dL — ABNORMAL LOW (ref 42–145)
Saturation Ratios: 5.4 % — ABNORMAL LOW (ref 20.0–50.0)
Transferrin: 343 mg/dL (ref 212.0–360.0)

## 2019-07-15 LAB — CBC
HCT: 28.1 % — ABNORMAL LOW (ref 36.0–46.0)
Hemoglobin: 8.8 g/dL — ABNORMAL LOW (ref 12.0–15.0)
MCHC: 31.3 g/dL (ref 30.0–36.0)
MCV: 78.3 fl (ref 78.0–100.0)
Platelets: 244 10*3/uL (ref 150.0–400.0)
RBC: 3.59 Mil/uL — ABNORMAL LOW (ref 3.87–5.11)
RDW: 14.6 % (ref 11.5–15.5)
WBC: 6.2 10*3/uL (ref 4.0–10.5)

## 2019-07-15 NOTE — Telephone Encounter (Signed)
Reviewed printed strip with Dr. Caryl Comes on 07/15/19. Per Dr. Caryl Comes, this strip does not show 2:1 atrial tachycardia, note was documented on incorrect patient. Strip does indicate frequent PACs. No new recommendations at this time per Dr. Caryl Comes.

## 2019-07-18 ENCOUNTER — Other Ambulatory Visit: Payer: Self-pay | Admitting: Family

## 2019-07-18 DIAGNOSIS — D649 Anemia, unspecified: Secondary | ICD-10-CM

## 2019-07-19 ENCOUNTER — Telehealth: Payer: Self-pay | Admitting: Family Medicine

## 2019-07-19 NOTE — Telephone Encounter (Signed)
Pt called returning your call 

## 2019-07-20 ENCOUNTER — Telehealth (HOSPITAL_COMMUNITY): Payer: Self-pay | Admitting: Pharmacist

## 2019-07-20 ENCOUNTER — Telehealth (HOSPITAL_COMMUNITY): Payer: Self-pay | Admitting: Cardiology

## 2019-07-20 NOTE — Telephone Encounter (Signed)
Spoke with [place] with paramedicine. Confirmed that [employer] contacted patient to inform her that [name] is nonformulary and patient needs to switch to sildenafil. Patient is currently taking sildenafil, Uptravi and Opsumit. [employer] needs a non-formulary request submitted so she can continue with Uptravi. I will call [employer] and start a nonformulary request for Uptravi.

## 2019-07-20 NOTE — Telephone Encounter (Signed)
Debra Saunders with para medicine called to report patient received letter from [facility] that they will no longer cover adamas and patient will need to switch to sildenafil. Patient is already on sildenafil   Advised would forward to pharmacy team for review

## 2019-07-20 NOTE — Telephone Encounter (Signed)
Patient Advocate Encounter   Received notification from [facility] that prior authorization for Debra Saunders is required.   PA submitted via phone Key [id number] Status is pending   Will continue to follow.  Audry Riles, PharmD, BCPS, BCCP, CPP Heart Failure Clinic Pharmacist [phone]

## 2019-07-21 ENCOUNTER — Other Ambulatory Visit: Payer: Self-pay | Admitting: *Deleted

## 2019-07-21 ENCOUNTER — Other Ambulatory Visit: Payer: Self-pay

## 2019-07-21 ENCOUNTER — Telehealth: Payer: Self-pay | Admitting: Family Medicine

## 2019-07-21 ENCOUNTER — Other Ambulatory Visit: Payer: Self-pay | Admitting: Family Medicine

## 2019-07-21 DIAGNOSIS — E119 Type 2 diabetes mellitus without complications: Secondary | ICD-10-CM

## 2019-07-21 DIAGNOSIS — Z794 Long term (current) use of insulin: Secondary | ICD-10-CM

## 2019-07-21 MED ORDER — METFORMIN HCL 500 MG PO TABS: 500.0000 mg | ORAL_TABLET | Freq: Every day | ORAL | 1 refills | Status: DC

## 2019-07-21 NOTE — Progress Notes (Signed)
Error with Rx it was sent and I had it cancelled to verify.  Gracelynne Benedict,cma

## 2019-07-21 NOTE — Telephone Encounter (Signed)
Pharmacist needs clarification about Metformin directions. Please call back asap.

## 2019-07-21 NOTE — Telephone Encounter (Signed)
I spoke with the patient and informed her to come by the office and pick up stool cards, they were placed in a kit at the front desk.  Patient understood.  Ja Ohman,cma

## 2019-07-21 NOTE — Telephone Encounter (Signed)
With help from [facility] the right sig was done and the refill was sent to [facility].  Johnell Landowski,cma

## 2019-07-22 NOTE — Telephone Encounter (Signed)
Advanced Heart Failure Patient Advocate Encounter  Prior Authorization for [name] has been approved.    PA# [id number] Effective dates: 06/24/19 through 06/22/20  Audry Riles, PharmD, BCPS, BCCP, CPP Heart Failure Clinic Pharmacist [phone]

## 2019-07-25 ENCOUNTER — Ambulatory Visit (INDEPENDENT_AMBULATORY_CARE_PROVIDER_SITE_OTHER): Payer: Medicare HMO | Admitting: Pharmacist

## 2019-07-25 DIAGNOSIS — I5022 Chronic systolic (congestive) heart failure: Secondary | ICD-10-CM | POA: Diagnosis not present

## 2019-07-25 DIAGNOSIS — E1122 Type 2 diabetes mellitus with diabetic chronic kidney disease: Secondary | ICD-10-CM

## 2019-07-25 NOTE — Chronic Care Management (AMB) (Signed)
Chronic Care Management   Follow Up Note   07/25/2019 Name: Debra Saunders MRN: [id number] DOB: 03-09-1946  Referred by: [name], MD Reason for referral : Chronic Care Management (Medication Management)   Debra Saunders is a 74 y.o. year old female who is a primary care patient of [name], [name], MD. The CCM team was consulted for assistance with chronic disease management and care coordination needs.    Contacted patient for medication management review today.   Review of patient status, including review of consultants reports, relevant laboratory and other test results, and collaboration with appropriate care team members and the patient's provider was performed as part of comprehensive patient evaluation and provision of chronic care management services.    SDOH (Social Determinants of Health) screening performed today: Financial Strain . See Care Plan for related entries.   Outpatient Encounter Medications as of 07/25/2019  Medication Sig  . amLODipine (NORVASC) 10 MG tablet Take 1 tablet (10 mg total) by mouth daily. (Patient taking differently: Take 5 mg by mouth daily. )  . Dulaglutide ([name]) 6.75 FF/6.3WG SOPN INJECT 0.'75MG'$  SUBCUTANEOUSLY ONCE A WEEK  . metFORMIN (GLUCOPHAGE) 500 MG tablet Take 1 tablet (500 mg total) by mouth daily with breakfast. Per sonnerberg  . Accu-Chek Softclix Lancets lancets TEST UP TO FOUR TIMES DAILY AS DIRECTED  . acetaminophen (TYLENOL) 325 MG tablet Take 325-650 mg by mouth every 6 (six) hours as needed for mild pain. Reported on 10/10/2015  . albuterol (PROVENTIL) (2.5 MG/3ML) 0.083% nebulizer solution Take 3 mLs (2.5 mg total) by nebulization every 6 (six) hours.  [name] apixaban (ELIQUIS) 5 MG TABS tablet Take 1 tablet (5 mg total) by mouth 2 (two) times daily.  . Blood Glucose Monitoring Suppl (ACCU-CHEK AVIVA PLUS) w/Device KIT USE AS DIRECTED  . cyanocobalamin 1000 MCG tablet Take 1,000 mcg by mouth daily. Daily in am  .  diclofenac sodium (VOLTAREN) 1 % GEL Apply 2 g topically 4 (four) times daily as needed.  . dicyclomine (BENTYL) 20 MG tablet Take 20 mg by mouth every 6 (six) hours.  . digoxin (LANOXIN) 0.125 MG tablet Take 1 tablet by mouth once daily  . gabapentin (NEURONTIN) 100 MG capsule TAKE 2 CAPSULES BY MOUTH THREE TIMES DAILY  . glucose blood (TRUE METRIX BLOOD GLUCOSE TEST) test strip Check blood glucose twice daily Dx:E11.8, Z79.4  . glucose blood test strip Use as instructed  . meclizine (ANTIVERT) 25 MG tablet Take 1 tablet (25 mg total) by mouth 3 (three) times daily as needed for dizziness. (Patient not taking: Reported on 07/11/2019)  . Menthol, Topical Analgesic, (BIOFREEZE EX) Apply 1 application topically as needed (for pain).   . ondansetron (ZOFRAN) 8 MG tablet Take 1 tablet (8 mg total) by mouth every 8 (eight) hours as needed for nausea or vomiting. (Patient not taking: Reported on 07/11/2019)  . OPSUMIT 10 MG tablet TAKE 1 TABLET BY MOUTH EVERY DAY  . OXYGEN Inhale 4 L into the lungs continuous.  . potassium chloride SA (K-DUR) 20 MEQ tablet Take 1 tablet (20 mEq total) by mouth daily.  . rosuvastatin (CRESTOR) 10 MG tablet Take 1 tablet by mouth once daily  . Selexipag (UPTRAVI) 200 MCG TABS Take 4 tablets (800 mcg total) by mouth 2 (two) times daily. Rx for titration doses faxed. Currently on 850mg BID (Patient taking differently: Take 1,000 mcg by mouth 2 (two) times daily. Rx for titration doses faxed. Currently on 10018m BID)  . sildenafil (REVATIO) 20  MG tablet Take 1 tablet (20 mg total) by mouth 3 (three) times daily.  [name] spironolactone (ALDACTONE) 25 MG tablet Take 1 tablet (25 mg total) by mouth at bedtime.  . torsemide (DEMADEX) 20 MG tablet TAKE 3 TABLETS BY MOUTH TWICE DAILY   No facility-administered encounter medications on file as of 07/25/2019.     Objective:   Goals Addressed            This Visit's Progress     Patient Stated   . "I want to stay healthy"  (pt-stated)       Current Barriers:  . Diabetes: controlled; most recent A1c 6.2% o Paramedicine EMT [name] continues to follow regularly to help fill pill box and collaborate w/ CHF clinic.  o Noted weight loss.  . Current antihyperglycemic regimen: metformin 709 mg daily, [name] 6.43 mg once week o Receiving [employer] patient assistance for [name]. Due to reapply for 2021. Notes she has ~2 month supply remaining . Current blood glucose readings: At goal w/ fasting 99-110s . Cardiovascular risk reduction- followed extensively by heart failure clinic/paramedicine program;  o Current hypertensive/CHF regimen: amlodipine 5 mg daily (recently reduced), digoxin 0.125 mg daily, Opsumit 10 mg daily, Uptravi 600 mcg BID, Revatio 20 mg TID o Current hyperlipidemia regimen: rosuvastatin 10 mg daily; LDL at goal <70 on last check  Pharmacist Clinical Goal(s):  [name] Over the next 90 days, patient with work with PharmD and primary care provider to address optimized medication management  Interventions: . Comprehensive med review performed w/ fill histories.  . Given severe HFrEF, could consider SGLT2 agent w/ HF data, including Jardiance or [place]. EGFR ~ 43-45 mL/min. Will message HF pharmacist to see thoughts on the option. Would likely qualify for assistance as well. Would consider d/c metformin or GLP1 if hypoglycemia develops. Will collaborate w/ PCP on this option as well.  . Once above therapy decisions have been made, will pursue patient assistance.   Patient Self Care Activities:  . Patient will check blood glucose BID, document, and provide at future appointments . Patient will focus on medication adherence by using pill boxes filled by paramedicine . Patient will report any questions or concerns to provider   Please see past updates related to this goal by clicking on the "Past Updates" button in the selected goal          Plan:  - Will collaborate w/ interdisciplinary team as  above. If no changes made to regimen at this time, will plan to mail patient portions of assistance applications for [name]  - Scheduled f/u call 09/22/19  [name], PharmD, Para [place], CPP Clinical Pharmacist [place] [facility] [phone]

## 2019-07-25 NOTE — Patient Instructions (Signed)
Visit Information  Goals Addressed            This Visit's Progress     Patient Stated   . "I want to stay healthy" (pt-stated)       Current Barriers:  . Diabetes: controlled; most recent A1c 6.2% o Paramedicine EMT [name] continues to follow regularly to help fill pill box and collaborate w/ CHF clinic.  o Noted weight loss.  . Current antihyperglycemic regimen: metformin 962 mg daily, [name] 8.36 mg once week o Receiving [employer] patient assistance for [name]. Due to reapply for 2021. Notes she has ~2 month supply remaining . Current blood glucose readings: At goal w/ fasting 99-110s . Cardiovascular risk reduction- followed extensively by heart failure clinic/paramedicine program;  o Current hypertensive/CHF regimen: amlodipine 5 mg daily (recently reduced), digoxin 0.125 mg daily, Opsumit 10 mg daily, Uptravi 600 mcg BID, Revatio 20 mg TID o Current hyperlipidemia regimen: rosuvastatin 10 mg daily; LDL at goal <70 on last check  Pharmacist Clinical Goal(s):  [name] Over the next 90 days, patient with work with PharmD and primary care provider to address optimized medication management  Interventions: . Comprehensive med review performed w/ fill histories.  . Given severe HFrEF, could consider SGLT2 agent w/ HF data, including Jardiance or [place]. EGFR ~ 43-45 mL/min. Will message HF pharmacist to see thoughts on the option. Would likely qualify for assistance as well. Would consider d/c metformin or GLP1 if hypoglycemia develops. Will collaborate w/ PCP on this option as well.  . Once above therapy decisions have been made, will pursue patient assistance.   Patient Self Care Activities:  . Patient will check blood glucose BID, document, and provide at future appointments . Patient will focus on medication adherence by using pill boxes filled by paramedicine . Patient will report any questions or concerns to provider   Please see past updates related to this goal by  clicking on the "Past Updates" button in the selected goal         The patient verbalized understanding of instructions provided today and declined a print copy of patient instruction materials.   Plan:  - Will collaborate w/ interdisciplinary team as above. If no changes made to regimen at this time, will plan to mail patient portions of assistance applications for [name]  - Scheduled f/u call 09/22/19  [name], PharmD, Para [place], CPP Clinical Pharmacist [place] [phone]

## 2019-07-28 ENCOUNTER — Telehealth: Payer: Self-pay | Admitting: Family Medicine

## 2019-07-28 NOTE — Telephone Encounter (Signed)
I called and spoke with the patient and she states she already has an appointment with Hematology and her cardiologist is Dr. Audelia Acton and I informed her to call and schedule a f/up with the cardiologist and she stated she will do that.  Shadow Stiggers,cma

## 2019-07-28 NOTE — Telephone Encounter (Signed)
Please let patient know I received a message from GI.  They recommended that she see hematology which they have referred her to.  They also recommended having her see cardiology.  Please check to see if the patient has an upcoming appointment with cardiology and if she does not we need to get her in to see them.  Thanks.

## 2019-07-29 ENCOUNTER — Ambulatory Visit: Payer: Self-pay | Admitting: Pharmacist

## 2019-07-29 DIAGNOSIS — E1122 Type 2 diabetes mellitus with diabetic chronic kidney disease: Secondary | ICD-10-CM

## 2019-07-29 NOTE — Chronic Care Management (AMB) (Signed)
Chronic Care Management   Follow Up Note   07/29/2019 Name: Debra Saunders MRN: [id number] DOB: [birth year]  Referred by: [name], MD Reason for referral : Chronic Care Management (Medication Management)   Debra Saunders is a 74 y.o. year old female who is a primary care patient of [name], [name], MD. The CCM team was consulted for assistance with chronic disease management and care coordination needs.    Care coordination completed today.   Review of patient status, including review of consultants reports, relevant laboratory and other test results, and collaboration with appropriate care team members and the patient's provider was performed as part of comprehensive patient evaluation and provision of chronic care management services.    SDOH (Social Determinants of Health) screening performed today: Financial Strain . See Care Plan for related entries.   Outpatient Encounter Medications as of 07/29/2019  Medication Sig  . Accu-Chek Softclix Lancets lancets TEST UP TO FOUR TIMES DAILY AS DIRECTED  . acetaminophen (TYLENOL) 325 MG tablet Take 325-650 mg by mouth every 6 (six) hours as needed for mild pain. Reported on 10/10/2015  . albuterol (PROVENTIL) (2.5 MG/3ML) 0.083% nebulizer solution Take 3 mLs (2.5 mg total) by nebulization every 6 (six) hours.  [name] amLODipine (NORVASC) 10 MG tablet Take 1 tablet (10 mg total) by mouth daily. (Patient taking differently: Take 5 mg by mouth daily. )  . apixaban (ELIQUIS) 5 MG TABS tablet Take 1 tablet (5 mg total) by mouth 2 (two) times daily.  . Blood Glucose Monitoring Suppl (ACCU-CHEK AVIVA PLUS) w/Device KIT USE AS DIRECTED  . cyanocobalamin 1000 MCG tablet Take 1,000 mcg by mouth daily. Daily in am  . diclofenac sodium (VOLTAREN) 1 % GEL Apply 2 g topically 4 (four) times daily as needed.  . dicyclomine (BENTYL) 20 MG tablet Take 20 mg by mouth every 6 (six) hours.  . digoxin (LANOXIN) 0.125 MG tablet Take 1 tablet by mouth once  daily  . Dulaglutide ([name]) 7.90 WI/0.9BD SOPN INJECT 0.75MG SUBCUTANEOUSLY ONCE A WEEK  . gabapentin (NEURONTIN) 100 MG capsule TAKE 2 CAPSULES BY MOUTH THREE TIMES DAILY  . glucose blood (TRUE METRIX BLOOD GLUCOSE TEST) test strip Check blood glucose twice daily Dx:E11.8, Z79.4  . glucose blood test strip Use as instructed  . meclizine (ANTIVERT) 25 MG tablet Take 1 tablet (25 mg total) by mouth 3 (three) times daily as needed for dizziness. (Patient not taking: Reported on 07/11/2019)  . Menthol, Topical Analgesic, (BIOFREEZE EX) Apply 1 application topically as needed (for pain).   . metFORMIN (GLUCOPHAGE) 500 MG tablet Take 1 tablet (500 mg total) by mouth daily with breakfast. Per sonnerberg  . ondansetron (ZOFRAN) 8 MG tablet Take 1 tablet (8 mg total) by mouth every 8 (eight) hours as needed for nausea or vomiting. (Patient not taking: Reported on 07/11/2019)  . OPSUMIT 10 MG tablet TAKE 1 TABLET BY MOUTH EVERY DAY  . OXYGEN Inhale 4 L into the lungs continuous.  . potassium chloride SA (K-DUR) 20 MEQ tablet Take 1 tablet (20 mEq total) by mouth daily.  . rosuvastatin (CRESTOR) 10 MG tablet Take 1 tablet by mouth once daily  . Selexipag (UPTRAVI) 200 MCG TABS Take 4 tablets (800 mcg total) by mouth 2 (two) times daily. Rx for titration doses faxed. Currently on 815mg BID (Patient taking differently: Take 1,000 mcg by mouth 2 (two) times daily. Rx for titration doses faxed. Currently on 1009m BID)  . sildenafil (REVATIO) 20 MG tablet Take  1 tablet (20 mg total) by mouth 3 (three) times daily.  [name] spironolactone (ALDACTONE) 25 MG tablet Take 1 tablet (25 mg total) by mouth at bedtime.  . torsemide (DEMADEX) 20 MG tablet TAKE 3 TABLETS BY MOUTH TWICE DAILY   No facility-administered encounter medications on file as of 07/29/2019.     Objective:   Goals Addressed            This Visit's Progress     Patient Stated   . "I want to stay healthy" (pt-stated)       Current  Barriers:  . Diabetes: controlled; most recent A1c 6.2% . Current antihyperglycemic regimen: metformin 403 mg daily, [name] 5.24 mg once week o Receiving [employer] patient assistance for [name]. Due to reapply for 2021. Notes she has ~2 month supply remaining . Current blood glucose readings: At goal w/ fasting 99-110s . Cardiovascular risk reduction- followed extensively by heart failure clinic/paramedicine program;  o Current hypertensive/CHF regimen: amlodipine 5 mg daily (recently reduced), digoxin 0.125 mg daily, Opsumit 10 mg daily, Uptravi 600 mcg BID, Revatio 20 mg TID o Current hyperlipidemia regimen: rosuvastatin 10 mg daily; LDL at goal <70 on last check  Pharmacist Clinical Goal(s):  [name] Over the next 90 days, patient with work with PharmD and primary care provider to address optimized medication management  Interventions: . Discussed w/ HF pharmacist. Last ECHO showed HFpEF. Given lack of currently published data in HFpEF, will avoid changing regimen to incorporate SGLT2. Will continue to evaluate as future evidence is published.  . Will collaborate w/ CPhT to mail patient her portion of [name] application, which she will mail back with updated income information. Will collaborate w/ Dr. [name] for his signature on provider portion. Once all parts received, will collaborate w/ CPhT to submit and follow up.  Patient Self Care Activities:  . Patient will check blood glucose BID, document, and provide at future appointments . Patient will focus on medication adherence by using pill boxes filled by paramedicine . Patient will report any questions or concerns to provider   Please see past updates related to this goal by clicking on the "Past Updates" button in the selected goal          Plan:  - Will collaborate w/ patient, provider, and CPhT as above  [name], PharmD, [place], [place] Pharmacist [facility] [employer] [phone]

## 2019-07-29 NOTE — Patient Instructions (Signed)
Visit Information  Goals Addressed            This Visit's Progress     Patient Stated   . "I want to stay healthy" (pt-stated)       Current Barriers:  . Diabetes: controlled; most recent A1c 6.2% . Current antihyperglycemic regimen: metformin 160 mg daily, [name] 1.09 mg once week o Receiving [employer] patient assistance for [name]. Due to reapply for 2021. Notes she has ~2 month supply remaining . Current blood glucose readings: At goal w/ fasting 99-110s . Cardiovascular risk reduction- followed extensively by heart failure clinic/paramedicine program;  o Current hypertensive/CHF regimen: amlodipine 5 mg daily (recently reduced), digoxin 0.125 mg daily, Opsumit 10 mg daily, Uptravi 600 mcg BID, Revatio 20 mg TID o Current hyperlipidemia regimen: rosuvastatin 10 mg daily; LDL at goal <70 on last check  Pharmacist Clinical Goal(s):  [name] Over the next 90 days, patient with work with PharmD and primary care provider to address optimized medication management  Interventions: . Discussed w/ HF pharmacist. Last ECHO showed HFpEF. Given lack of currently published data in HFpEF, will avoid changing regimen to incorporate SGLT2. Will continue to evaluate as future evidence is published.  . Will collaborate w/ CPhT to mail patient her portion of [name] application, which she will mail back with updated income information. Will collaborate w/ Dr. [name] for his signature on provider portion. Once all parts received, will collaborate w/ CPhT to submit and follow up.  Patient Self Care Activities:  . Patient will check blood glucose BID, document, and provide at future appointments . Patient will focus on medication adherence by using pill boxes filled by paramedicine . Patient will report any questions or concerns to provider   Please see past updates related to this goal by clicking on the "Past Updates" button in the selected goal         The patient verbalized understanding of  instructions provided today and declined a print copy of patient instruction materials.   Plan:  - Will collaborate w/ patient, provider, and CPhT as above  [name], PharmD, [place], [place] [phone]

## 2019-08-01 ENCOUNTER — Other Ambulatory Visit: Payer: Self-pay

## 2019-08-01 ENCOUNTER — Encounter: Payer: Self-pay | Admitting: Internal Medicine

## 2019-08-01 ENCOUNTER — Inpatient Hospital Stay: Payer: Medicare HMO | Attending: Internal Medicine | Admitting: Internal Medicine

## 2019-08-01 ENCOUNTER — Inpatient Hospital Stay: Payer: Medicare HMO

## 2019-08-01 DIAGNOSIS — R0602 Shortness of breath: Secondary | ICD-10-CM

## 2019-08-01 DIAGNOSIS — Z87891 Personal history of nicotine dependence: Secondary | ICD-10-CM | POA: Diagnosis not present

## 2019-08-01 DIAGNOSIS — E119 Type 2 diabetes mellitus without complications: Secondary | ICD-10-CM

## 2019-08-01 DIAGNOSIS — E611 Iron deficiency: Secondary | ICD-10-CM

## 2019-08-01 DIAGNOSIS — Z7901 Long term (current) use of anticoagulants: Secondary | ICD-10-CM | POA: Diagnosis not present

## 2019-08-01 DIAGNOSIS — Z86718 Personal history of other venous thrombosis and embolism: Secondary | ICD-10-CM

## 2019-08-01 DIAGNOSIS — Z803 Family history of malignant neoplasm of breast: Secondary | ICD-10-CM

## 2019-08-01 DIAGNOSIS — Z791 Long term (current) use of non-steroidal anti-inflammatories (NSAID): Secondary | ICD-10-CM | POA: Diagnosis not present

## 2019-08-01 DIAGNOSIS — J449 Chronic obstructive pulmonary disease, unspecified: Secondary | ICD-10-CM

## 2019-08-01 DIAGNOSIS — Z950 Presence of cardiac pacemaker: Secondary | ICD-10-CM | POA: Diagnosis not present

## 2019-08-01 DIAGNOSIS — Z8673 Personal history of transient ischemic attack (TIA), and cerebral infarction without residual deficits: Secondary | ICD-10-CM | POA: Diagnosis not present

## 2019-08-01 DIAGNOSIS — Z7984 Long term (current) use of oral hypoglycemic drugs: Secondary | ICD-10-CM

## 2019-08-01 DIAGNOSIS — Z8349 Family history of other endocrine, nutritional and metabolic diseases: Secondary | ICD-10-CM | POA: Insufficient documentation

## 2019-08-01 DIAGNOSIS — I4819 Other persistent atrial fibrillation: Secondary | ICD-10-CM | POA: Diagnosis not present

## 2019-08-01 DIAGNOSIS — E785 Hyperlipidemia, unspecified: Secondary | ICD-10-CM | POA: Diagnosis not present

## 2019-08-01 DIAGNOSIS — I5042 Chronic combined systolic (congestive) and diastolic (congestive) heart failure: Secondary | ICD-10-CM | POA: Diagnosis not present

## 2019-08-01 DIAGNOSIS — Z8249 Family history of ischemic heart disease and other diseases of the circulatory system: Secondary | ICD-10-CM | POA: Insufficient documentation

## 2019-08-01 DIAGNOSIS — I130 Hypertensive heart and chronic kidney disease with heart failure and stage 1 through stage 4 chronic kidney disease, or unspecified chronic kidney disease: Secondary | ICD-10-CM | POA: Insufficient documentation

## 2019-08-01 DIAGNOSIS — D649 Anemia, unspecified: Secondary | ICD-10-CM

## 2019-08-01 DIAGNOSIS — Z79899 Other long term (current) drug therapy: Secondary | ICD-10-CM | POA: Insufficient documentation

## 2019-08-01 DIAGNOSIS — R5381 Other malaise: Secondary | ICD-10-CM | POA: Diagnosis not present

## 2019-08-01 DIAGNOSIS — D509 Iron deficiency anemia, unspecified: Secondary | ICD-10-CM | POA: Diagnosis not present

## 2019-08-01 DIAGNOSIS — R5383 Other fatigue: Secondary | ICD-10-CM

## 2019-08-01 DIAGNOSIS — Z992 Dependence on renal dialysis: Secondary | ICD-10-CM

## 2019-08-01 LAB — COMPREHENSIVE METABOLIC PANEL
ALT: 10 U/L (ref 0–44)
AST: 15 U/L (ref 15–41)
Albumin: 4 g/dL (ref 3.5–5.0)
Alkaline Phosphatase: 124 U/L (ref 38–126)
Anion gap: 10 (ref 5–15)
BUN: 24 mg/dL — ABNORMAL HIGH (ref 8–23)
CO2: 28 mmol/L (ref 22–32)
Calcium: 9.3 mg/dL (ref 8.9–10.3)
Chloride: 99 mmol/L (ref 98–111)
Creatinine, Ser: 1.16 mg/dL — ABNORMAL HIGH (ref 0.44–1.00)
GFR calc Af Amer: 53 mL/min — ABNORMAL LOW (ref >60)
GFR calc non Af Amer: 46 mL/min — ABNORMAL LOW (ref >60)
Glucose, Bld: 94 mg/dL (ref 70–99)
Potassium: 3.8 mmol/L (ref 3.5–5.1)
Sodium: 137 mmol/L (ref 135–145)
Total Bilirubin: 0.5 mg/dL (ref 0.3–1.2)
Total Protein: 8.1 g/dL (ref 6.5–8.1)

## 2019-08-01 LAB — CBC WITH DIFFERENTIAL/PLATELET
Abs Immature Granulocytes: 0.05 10*3/uL (ref 0.00–0.07)
Basophils Absolute: 0 10*3/uL (ref 0.0–0.1)
Basophils Relative: 1 %
Eosinophils Absolute: 0.2 10*3/uL (ref 0.0–0.5)
Eosinophils Relative: 3 %
HCT: 29.1 % — ABNORMAL LOW (ref 36.0–46.0)
Hemoglobin: 8.3 g/dL — ABNORMAL LOW (ref 12.0–15.0)
Immature Granulocytes: 1 %
Lymphocytes Relative: 27 %
Lymphs Abs: 1.7 10*3/uL (ref 0.7–4.0)
MCH: 23.8 pg — ABNORMAL LOW (ref 26.0–34.0)
MCHC: 28.5 g/dL — ABNORMAL LOW (ref 30.0–36.0)
MCV: 83.4 fL (ref 80.0–100.0)
Monocytes Absolute: 0.4 10*3/uL (ref 0.1–1.0)
Monocytes Relative: 5 %
Neutro Abs: 4.1 10*3/uL (ref 1.7–7.7)
Neutrophils Relative %: 63 %
Platelets: 241 10*3/uL (ref 150–400)
RBC: 3.49 MIL/uL — ABNORMAL LOW (ref 3.87–5.11)
RDW: 14.6 % (ref 11.5–15.5)
WBC: 6.5 10*3/uL (ref 4.0–10.5)
nRBC: 0 % (ref 0.0–0.2)

## 2019-08-01 LAB — RETICULOCYTES
Immature Retic Fract: 11.4 % (ref 2.3–15.9)
RBC.: 3.5 MIL/uL — ABNORMAL LOW (ref 3.87–5.11)
Retic Count, Absolute: 58.8 10*3/uL (ref 19.0–186.0)
Retic Ct Pct: 1.7 % (ref 0.4–3.1)

## 2019-08-01 LAB — C-REACTIVE PROTEIN: CRP: 0.7 mg/dL (ref <1.0)

## 2019-08-01 LAB — FOLATE: Folate: 9.1 ng/mL (ref >5.9)

## 2019-08-01 LAB — LACTATE DEHYDROGENASE: LDH: 135 U/L (ref 98–192)

## 2019-08-01 LAB — VITAMIN B12: Vitamin B-12: 231 pg/mL (ref 180–914)

## 2019-08-01 NOTE — Progress Notes (Signed)
[place] NOTE  Patient Care Team: [name], MD as PCP - General (Family Medicine) Wellington Hampshire, MD as PCP - Cardiology (Cardiology) Thom Chimes, MD as Referring Physician (Allergy) Juanito Doom, MD as Referring Physician (Internal Medicine) De Hollings[place], [facility] as Pharmacist (Pharmacist) Anemia CHIEF COMPLAINTS/PURPOSE OF CONSULTATION:    HEMATOLOGY HISTORY  # IRON DEFICIENCY ANEMIA-Jan today 21 PCP Hb-8.8; Antonieta Pert sat-5%; ferritin 11] EGD-; colonoscopy MARCH 2020 -polyp [[facility]]; remote-EGD-  # CHF chronic -[ 2004 on 4 lits; Dr.Arida/Klein]; walker   HISTORY OF PRESENTING ILLNESS:  Debra Saunders 74 y.o.  female has been referred to [place] for further evaluation/work-up for anemia.  Patient noted to have worsening anemia of the last many months.  Most recent hemoglobin 3 weeks ago was 8.8.  Patient feels significantly tired.  Denies any blood in stools or black stools.  Patient is not on p.o. iron.  Blood in stools: None Change in bowel habits- None Blood in urine: None Difficulty swallowing: None Abnormal weight loss: None Iron supplementation: none;  Prior Blood transfusions: ? In 2014 [stroke] Vaginal bleeding: None   Review of Systems  Constitutional: Positive for malaise/fatigue. Negative for chills, diaphoresis, fever and weight loss.  HENT: Negative for nosebleeds and sore throat.   Eyes: Negative for double vision.  Respiratory: Positive for shortness of breath. Negative for cough, hemoptysis, sputum production and wheezing.   Cardiovascular: Negative for chest pain, palpitations, orthopnea and leg swelling.  Gastrointestinal: Negative for abdominal pain, blood in stool, constipation, diarrhea, heartburn, melena, nausea and vomiting.  Genitourinary: Negative for dysuria, frequency and urgency.  Musculoskeletal: Negative for back pain and joint pain.  Skin: Negative.  Negative for itching and rash.  Neurological:  Negative for dizziness, tingling, focal weakness, weakness and headaches.  Endo/Heme/Allergies: Does not bruise/bleed easily.  Psychiatric/Behavioral: Negative for depression. The patient is not nervous/anxious and does not have insomnia.     MEDICAL HISTORY:  Past Medical History:  Diagnosis Date  . Arthritis   . Atrial fibrillation, persistent ([place])    a. s/p TEE-guided DCCV on 08/20/2017  . Cardiorenal syndrome    a. 09/2017 Creat rose to 2.7 w/ diuresis-->HD x 2.  . Chest pain    a. 01/2012 Cath: nonobs dzs;  c. 04/2014 MV: No ischemia.  . Chronic back pain   . Chronic combined systolic (congestive) and diastolic (congestive) heart failure ([place])    a. Dx 2005 @ [facility];  b. 02/2014 Echo: Ef 55-60%, no rwma, mild MR, PASP 11mHg. c. 07/2017: EF 30-35%, diffuse HK, trivial AI, severe MR, moderate TR; d. 09/2017 Echo: EF 40-45%, ant, antsep, ap, basal HK; e. 02/2018 Echo: EF 55-60%, Gr1 DD. Ao sclerosis w/o stenosis. Mild to mod MR. Mildly dil LA. Mildly reduced RV fxn. Mild TR. PASP 35-416mg.  . Marland Kitchenhronic respiratory failure (HCC)    a. on home O2  . COPD (chronic obstructive pulmonary disease) ([place]   a. Home O2 - 3lpm  . Gastritis   . History of intracranial hemorrhage    a. 6/14 described by neurosurgery as "small frontal posttraumatic SAH " - pt was on xarelto @ the time.  . Hyperlipidemia   . Hypertension   . Lipoma of back   . Lymphedema    a. Chronic LLE edema.  . Marland KitchenICM (nonischemic cardiomyopathy) ([facility]   a. 09/2017 Echo: EF 40-45%; b. 09/2017 Cath: minor, insignificant CAD; c. 02/2018 Echo: EF 55-60%, Gr1 DD.  . Marland Kitchenbesity   .  PAH (pulmonary artery hypertension) ([place])    a. 09/2017 Echo: PASP 5mHg; b. 09/2017 RHC: RA 23, RV 84/13, PA 84/29, PCWP 20, CO 3.89, CI 1.89. LVEDP 18.  . Sepsis with metabolic encephalopathy ([place]   . Sleep apnea   . Streptococcal bacteremia   . Stroke ([place]   . [place]noid hemorrhage ([place] 01/19/2013  . Symptomatic bradycardia    a. s/p BSX PPM.  .  Thyroid nodule   . Type II diabetes mellitus ([place]   . Urinary incontinence     SURGICAL HISTORY: Past Surgical History:  Procedure Laterality Date  . ABDOMINAL HYSTERECTOMY  1969  . CARDIAC CATHETERIZATION  2013   [place] No obstructive CAD: Only 20% ostial left circumflex.  .Marland KitchenCARDIOVERSION N/A 10/12/2017   Procedure: CARDIOVERSION;  Surgeon: GMinna Merritts MD;  Location: [facility] ORS;  Service: Cardiovascular;  Laterality: N/A;  . CHOLECYSTECTOMY    . COLONOSCOPY WITH PROPOFOL N/A 09/10/2018   Procedure: COLONOSCOPY WITH PROPOFOL;  Surgeon: EManya Silvas MD;  [facility];  Service: Endoscopy;  Laterality: N/A;  . CYST REMOVAL TRUNK  2002   BACK  . DIALYSIS/PERMA CATHETER INSERTION N/A 10/06/2017   Procedure: DIALYSIS/PERMA CATHETER INSERTION;  Surgeon: SKatha Cabal MD;  [place] [facility] [place];  Service: Cardiovascular;  Laterality: N/A;  . INSERT / REPLACE / REMOVE PACEMAKER    . lipoma removal  2014   back  . PACEMAKER INSERTION  [id number] dual [address] pacemaker implanted by Dr KCaryl Comesfor symptomatic bradycardia  . PERMANENT PACEMAKER INSERTION N/A 01/09/2014   Procedure: PERMANENT PACEMAKER INSERTION;  Surgeon: SDeboraha Sprang MD;  [facility];  Service: Cardiovascular;  Laterality: N/A;  . RIGHT HEART CATH N/A 04/19/2018   Procedure: RIGHT HEART CATH;  Surgeon: MLarey Dresser MD;  [place] [place] [facility];  Service: Cardiovascular;  Laterality: N/A;  . RIGHT/LEFT HEART CATH AND CORONARY ANGIOGRAPHY N/A 10/19/2017   Procedure: RIGHT/LEFT HEART CATH AND CORONARY ANGIOGRAPHY;  Surgeon: AWellington Hampshire MD;  [facility];  Service: Cardiovascular;  Laterality: N/A;  . TEE WITHOUT CARDIOVERSION N/A 08/20/2017   Procedure: TRANSESOPHAGEAL ECHOCARDIOGRAM (TEE) & Direct current cardioversion;  Surgeon: GMinna Merritts MD;  Location: [facility] ORS;  Service: Cardiovascular;  Laterality: N/A;  . TRIGGER FINGER  RELEASE    . VAGINAL HYSTERECTOMY      SOCIAL HISTORY: Social History   Socioeconomic History  . Marital status: Divorced    Spouse name: Not on file  . Number of children: 4  . Years of education: 192 . Highest education level: High school graduate  Occupational History  . Occupation: Retired- factory work    Comment: exposed to fPharmacist, community Tobacco Use  . Smoking status: Former Smoker    Packs/day: 0.30    Years: 2.00    Pack years: 0.60    Types: Cigarettes    Quit date: 06/23/1990    Years since quitting: 29.1  . Smokeless tobacco: Never Used  Substance and Sexual Activity  . Alcohol use: No  . Drug use: No  . Sexual activity: Not Currently  Other Topics Concern  . Not on file  Social History Narrative   [place] children. No pets.      Work - yRestaurant manager, fast food retired      Diet - regular      02/14/2019: reports has Meals On Wheels delivered to her home;  Remote hx of smoking > 30 years; No alcohol-    Social Determinants of Health   Financial Resource Strain:   . Difficulty of Paying Living Expenses: Not on file  Food Insecurity:   . Worried About Charity fundraiser in the Last Year: Not on file  . Ran Out of Food in the Last Year: Not on file  Transportation Needs: No Transportation Needs  . Lack of Transportation (Medical): No  . Lack of Transportation (Non-Medical): No  Physical Activity:   . Days of Exercise per Week: Not on file  . Minutes of Exercise per Session: Not on file  Stress:   . Feeling of Stress : Not on file  Social Connections:   . Frequency of Communication with Friends and Family: Not on file  . Frequency of Social Gatherings with Friends and Family: Not on file  . Attends Religious Services: Not on file  . Active Member of Clubs or Organizations: Not on file  . Attends Archivist Meetings: Not on file  . Marital Status: Not on file  Intimate Partner Violence:   . Fear of Current or  Ex-Partner: Not on file  . Emotionally Abused: Not on file  . Physically Abused: Not on file  . Sexually Abused: Not on file    FAMILY HISTORY: Family History  Problem Relation Age of Onset  . Heart disease Mother   . Hypertension Mother   . COPD Mother        was a smoker  . Thyroid disease Mother   . Hypertension Father   . Hypertension Sister   . Thyroid disease Sister   . Hypertension Sister   . Thyroid disease Sister   . Breast cancer Maternal Aunt   . Kidney disease Neg Hx   . Bladder Cancer Neg Hx     ALLERGIES:  is allergic to aspirin; other; and penicillins.  MEDICATIONS:  Current Outpatient Medications  Medication Sig Dispense Refill  . Accu-Chek Softclix Lancets lancets TEST UP TO FOUR TIMES DAILY AS DIRECTED 400 each 0  . acetaminophen (TYLENOL) 325 MG tablet Take 325-650 mg by mouth every 6 (six) hours as needed for mild pain. Reported on 10/10/2015    . albuterol (PROVENTIL) (2.5 MG/3ML) 0.083% nebulizer solution Take 3 mLs (2.5 mg total) by nebulization every 6 (six) hours. 75 mL 12  . amLODipine (NORVASC) 10 MG tablet Take 1 tablet (10 mg total) by mouth daily. (Patient taking differently: Take 5 mg by mouth daily. ) 30 tablet 6  . apixaban (ELIQUIS) 5 MG TABS tablet Take 1 tablet (5 mg total) by mouth 2 (two) times daily. 60 tablet 11  . Blood Glucose Monitoring Suppl (ACCU-CHEK AVIVA PLUS) w/Device KIT USE AS DIRECTED 1 kit 0  . cyanocobalamin 1000 MCG tablet Take 1,000 mcg by mouth daily. Daily in am    . diclofenac sodium (VOLTAREN) 1 % GEL Apply 2 g topically 4 (four) times daily as needed. 50 g 0  . dicyclomine (BENTYL) 20 MG tablet Take 20 mg by mouth every 6 (six) hours.    . digoxin (LANOXIN) 0.125 MG tablet Take 1 tablet by mouth once daily 90 tablet 0  . Dulaglutide ([name]) 3.84 YK/5.9DJ SOPN INJECT 0.75MG SUBCUTANEOUSLY ONCE A WEEK 4 pen 2  . gabapentin (NEURONTIN) 100 MG capsule TAKE 2 CAPSULES BY MOUTH THREE TIMES DAILY 180 capsule 0  .  glucose blood (TRUE METRIX BLOOD GLUCOSE TEST) test strip Check blood glucose twice daily Dx:E11.8, Z79.4 100  each 12  . glucose blood test strip Use as instructed 100 each 12  . meclizine (ANTIVERT) 25 MG tablet Take 1 tablet (25 mg total) by mouth 3 (three) times daily as needed for dizziness. (Patient not taking: Reported on 07/11/2019) 30 tablet 0  . Menthol, Topical Analgesic, (BIOFREEZE EX) Apply 1 application topically as needed (for pain).     . metFORMIN (GLUCOPHAGE) 500 MG tablet Take 1 tablet (500 mg total) by mouth daily with breakfast. Per sonnerberg 90 tablet 1  . ondansetron (ZOFRAN) 8 MG tablet Take 1 tablet (8 mg total) by mouth every 8 (eight) hours as needed for nausea or vomiting. (Patient not taking: Reported on 07/11/2019) 30 tablet 2  . OPSUMIT 10 MG tablet TAKE 1 TABLET BY MOUTH EVERY DAY 30 tablet 3  . OXYGEN Inhale 4 L into the lungs continuous.    . potassium chloride SA (K-DUR) 20 MEQ tablet Take 1 tablet (20 mEq total) by mouth daily. 90 tablet 3  . rosuvastatin (CRESTOR) 10 MG tablet Take 1 tablet by mouth once daily 90 tablet 0  . Selexipag (UPTRAVI) 200 MCG TABS Take 4 tablets (800 mcg total) by mouth 2 (two) times daily. Rx for titration doses faxed. Currently on 833mg BID (Patient taking differently: Take 1,000 mcg by mouth 2 (two) times daily. Rx for titration doses faxed. Currently on 10034m BID) 112 tablet 3  . sildenafil (REVATIO) 20 MG tablet Take 1 tablet (20 mg total) by mouth 3 (three) times daily. 270 tablet 3  . spironolactone (ALDACTONE) 25 MG tablet Take 1 tablet (25 mg total) by mouth at bedtime. 30 tablet 6  . torsemide (DEMADEX) 20 MG tablet TAKE 3 TABLETS BY MOUTH TWICE DAILY 540 tablet 0   No current facility-administered medications for this visit.      PHYSICAL EXAMINATION:   Vitals:   08/01/19 1432  BP: 126/63  Pulse: (!) 59  Temp: (!) 97.5 F (36.4 C)   Filed Weights   08/01/19 1432  Weight: 181 lb (82.1 kg)    Physical Exam   Constitutional: She is oriented to person, place, and time and well-developed, well-nourished, and in no distress.  Patient in wheelchair.  Accompanied by daughter.  She is on O2 nasal cannula.  HENT:  Head: Normocephalic and atraumatic.  Mouth/Throat: Oropharynx is clear and moist. No oropharyngeal exudate.  Eyes: Pupils are equal, round, and reactive to light.  Cardiovascular: Normal rate and regular rhythm.  Pulmonary/Chest: No respiratory distress. She has no wheezes.  Decreased air entry bilaterally.  Abdominal: Soft. Bowel sounds are normal. She exhibits no distension and no mass. There is no abdominal tenderness. There is no rebound and no guarding.  Musculoskeletal:        General: No tenderness or edema. Normal range of motion.     Cervical back: Normal range of motion and neck supple.  Neurological: She is alert and oriented to person, place, and time.  Skin: Skin is warm.  Psychiatric: Affect normal.    LABORATORY DATA:  I have reviewed the data as listed Lab Results  Component Value Date   WBC 6.5 08/01/2019   HGB 8.3 (L) 08/01/2019   HCT 29.1 (L) 08/01/2019   MCV 83.4 08/01/2019   PLT 241 08/01/2019   Recent Labs    08/20/18 1141 11/19/18 1517 01/20/19 1121 01/20/19 1121 02/03/19 1413 07/08/19 1504 08/01/19 1524  NA   < > 142 137   < > 138 139 137  K   < >  4.5 3.7   < > 4.1 4.4 3.8  CL   < > 99 102   < > 99 101 99  CO2   < > 25 24   < > '30 28 28  ' GLUCOSE   < > 231* 128*   < > 114* 102* 94  BUN   < > 23 13   < > 26* 26* 24*  CREATININE   < > 1.40* 1.28*   < > 1.39* 1.39* 1.16*  CALCIUM   < > 9.1 9.1   < > 9.7 9.4 9.3  GFRNONAA  --  37* 41*  --   --   --  46*  GFRAA  --  42* 47*  --   --   --  53*  PROT  --   --   --   --  7.6 7.4 8.1  ALBUMIN  --   --   --   --  4.1  --  4.0  AST  --   --   --   --  '15 11 15  ' ALT  --   --   --   --  7 5* 10  ALKPHOS  --   --   --   --  111  --  124  BILITOT  --   --   --   --  0.4 0.4 0.5  BILIDIR  --   --   --   --   0.1  --   --    < > = values in this interval not displayed.     No results found.  Iron deficiency #Anemia-iron deficiency-most recent hemoglobin 8.8- quite symptomatic.  Discussed that I would recommend IV iron infusion Venofer.  Discussed however if less than 8 would recommend PRBC transfusion.  Discussed the potential acute infusion reactions with IV iron; which are quite rare.  Patient understands the risk; will proceed with infusions.  #Etiology-iron deficient is unclear.  Question CKD versus others.  Colonoscopy March 2020-polyps.  Stool occult pending.  If etiology is unclear; then would recommend a bone marrow biopsy.  Will check LDH; CBC multiple myeloma panel reticulocyte count haptoglobin.  #History of DVT/s/p IVC filter; ?  A. Fib-Eliquis-stable.  Thank you Dr.Sonnenberg for allowing me to participate in the care of your pleasant patient. Please do not hesitate to contact me with questions or concerns in the interim.  # DISPOSITION: # labs- today- ordered # IV venofer weekly x4; start ASAP/this week if possible # Follow up in 2 weeks-MD-labs- cbc;Venofer- Dr.B  All questions were answered. The patient knows to call the clinic with any problems, questions or concerns.    Cammie Sickle, MD 08/01/2019 4:30 PM

## 2019-08-01 NOTE — Assessment & Plan Note (Addendum)
#  Anemia-iron deficiency-most recent hemoglobin 8.8- quite symptomatic.  Discussed that I would recommend IV iron infusion Venofer.  Discussed however if less than 8 would recommend PRBC transfusion.  Discussed the potential acute infusion reactions with IV iron; which are quite rare.  Patient understands the risk; will proceed with infusions.  #Etiology-iron deficient is unclear.  Question CKD versus others.  Colonoscopy March 2020-polyps.  Stool occult pending.  If etiology is unclear; then would recommend a bone marrow biopsy.  Will check LDH; CBC multiple myeloma panel reticulocyte count haptoglobin.  #History of DVT/s/p IVC filter; ?  A. Fib-Eliquis-stable.  Thank you Dr.Sonnenberg for allowing me to participate in the care of your pleasant patient. Please do not hesitate to contact me with questions or concerns in the interim.  # DISPOSITION: # labs- today- ordered # IV venofer weekly x4; start ASAP/this week if possible # Follow up in 2 weeks-MD-labs- cbc;Venofer- Dr.B

## 2019-08-02 ENCOUNTER — Other Ambulatory Visit: Payer: Self-pay | Admitting: Internal Medicine

## 2019-08-02 ENCOUNTER — Other Ambulatory Visit: Payer: Self-pay | Admitting: Pharmacy Technician

## 2019-08-02 DIAGNOSIS — E538 Deficiency of other specified B group vitamins: Secondary | ICD-10-CM

## 2019-08-02 NOTE — Patient Outreach (Signed)
[facility] [facility]) Care Management  08/02/2019  NASHIYA DISBROW 06-Jul-1945 [id number]                                        Medication Assistance Referral  Referral From: [facility] Embedded RPh [name] T.   Medication/Company: [employer] Patient application portion:  Mailed Provider application portion:  N/A Embedded pharmacist to have signed while in clinic to Dr. [name] Provider address/fax verified via: Call to office     Follow up:  Will follow up with patient in 5-10 business days to confirm application(s) have been received.  Kanika Bungert P. Aldrich Lloyd, [place] Management [phone]

## 2019-08-03 LAB — MULTIPLE MYELOMA PANEL, SERUM
Albumin SerPl Elph-Mcnc: 3.7 g/dL (ref 2.9–4.4)
Albumin/Glob SerPl: 1.1 (ref 0.7–1.7)
Alpha 1: 0.3 g/dL (ref 0.0–0.4)
Alpha2 Glob SerPl Elph-Mcnc: 0.9 g/dL (ref 0.4–1.0)
B-Globulin SerPl Elph-Mcnc: 1.1 g/dL (ref 0.7–1.3)
Gamma Glob SerPl Elph-Mcnc: 1.3 g/dL (ref 0.4–1.8)
Globulin, Total: 3.7 g/dL (ref 2.2–3.9)
IgA: 232 mg/dL (ref 64–422)
IgG (Immunoglobin G), Serum: 1274 mg/dL (ref 586–1602)
IgM (Immunoglobulin M), Srm: 170 mg/dL (ref 26–217)
Total Protein ELP: 7.4 g/dL (ref 6.0–8.5)

## 2019-08-03 LAB — KAPPA/LAMBDA LIGHT CHAINS
Kappa free light chain: 47.3 mg/L — ABNORMAL HIGH (ref 3.3–19.4)
Kappa, lambda light chain ratio: 1.76 — ABNORMAL HIGH (ref 0.26–1.65)
Lambda free light chains: 26.8 mg/L — ABNORMAL HIGH (ref 5.7–26.3)

## 2019-08-03 LAB — HAPTOGLOBIN: Haptoglobin: 243 mg/dL (ref 42–346)

## 2019-08-03 NOTE — Progress Notes (Deleted)
Office Visit    Patient Name: Debra Saunders Date of Encounter: 08/04/2019  Primary Care Provider:  [name], MD Primary Cardiologist:  [name], MD  Chief Complaint    74 year old female with history of chronic diastolic heart failure, nonobstructive CAD, atrial fibrillation on Eliquis, bradycardia s/p PPM [facility] [employer]), chronic respiratory failure on home O2, COPD, PAH, DM2, hypertension, CKD (required HD in the past), DVT s/p IVC filter 2004, hyperlipidemia, traumatic subarachnoid hemorrhage while on Xarelto 2014, CVA 2014, OSA, and chronic lymphedema, and here for 65-monthfollow-up for dizziness.  Past Medical History    Past Medical History:  Diagnosis Date  . Arthritis   . Atrial fibrillation, persistent ([place]    a. s/p TEE-guided DCCV on 08/20/2017  . Cardiorenal syndrome    a. 09/2017 Creat rose to 2.7 w/ diuresis-->HD x 2.  . Chest pain    a. 01/2012 Cath: nonobs dzs;  c. 04/2014 MV: No ischemia.  . Chronic back pain   . Chronic combined systolic (congestive) and diastolic (congestive) heart failure (HS[place]    a. Dx 2005 @ [facility];  b. 02/2014 Echo: Ef 55-60%, no rwma, mild MR, PASP 344mg. c. 07/2017: EF 30-35%, diffuse HK, trivial AI, severe MR, moderate TR; d. 09/2017 Echo: EF 40-45%, ant, antsep, ap, basal HK; e. 02/2018 Echo: EF 55-60%, Gr1 DD. Ao sclerosis w/o stenosis. Mild to mod MR. Mildly dil LA. Mildly reduced RV fxn. Mild TR. PASP 35-408m.  . CMarland Kitchenronic respiratory failure (HCC)    a. on home O2  . COPD (chronic obstructive pulmonary disease) (HCCW[place]  a. Home O2 - 3lpm  . Gastritis   . History of intracranial hemorrhage    a. 6/14 described by neurosurgery as "small frontal posttraumatic SAH " - pt was on xarelto @ the time.  . Hyperlipidemia   . Hypertension   . Lipoma of back   . Lymphedema    a. Chronic LLE edema.  . NMarland KitchenCM (nonischemic cardiomyopathy) (HCCPocahontas  a. 09/2017 Echo: EF 40-45%; b. 09/2017 Cath: minor, insignificant CAD; c. 02/2018  Echo: EF 55-60%, Gr1 DD.  . OMarland Kitchenesity   . PAH (pulmonary artery hypertension) (HCC[place]  a. 09/2017 Echo: PASP 38m69m b. 09/2017 RHC: RA 23, RV 84/13, PA 84/29, PCWP 20, CO 3.89, CI 1.89. LVEDP 18.  . Sepsis with metabolic encephalopathy (HCC)[place]. Sleep apnea   . Streptococcal bacteremia   . Stroke (HCC)[place]. Subarachnoid hemorrhage (HCC)[place]30/2014  . Symptomatic bradycardia    a. s/p BSX PPM.  . Thyroid nodule   . Type II diabetes mellitus (HCC)[place]. Urinary incontinence    Past Surgical History:  Procedure Laterality Date  . ABDOMINAL HYSTERECTOMY  1969  . CARDIAC [facility]  [facility]   [facility]: Only 20% ostial left circumflex.  . CAMarland KitchenDIOVERSION N/A 10/12/2017   Procedure: CARDIOVERSION;  Surgeon: GollMinna Merritts;  Location: [facility] ORS;  Service: Cardiovascular;  Laterality: N/A;  . CHOLECYSTECTOMY    . COLONOSCOPY WITH PROPOFOL N/A 09/10/2018   Procedure: COLONOSCOPY WITH PROPOFOL;  Surgeon: ElliManya Silvas;  [facility];  Service: Endoscopy;  Laterality: N/A;  . CYST REMOVAL TRUNK  2002   BACK  . DIALYSIS/PERMA CATHETER INSERTION N/A 10/06/2017   Procedure: DIALYSIS/PERMA CATHETER INSERTION;  Surgeon: SchnKatha Cabal;  [facility];  Service: Cardiovascular;  Laterality: N/A;  . INSERT / REPLACE / REMOVE PACEMAKER    .  lipoma removal  2014   back  . PACEMAKER INSERTION  [address] [employer] dual chamber pacemaker implanted by Dr Caryl Comes for symptomatic bradycardia  . PERMANENT PACEMAKER INSERTION N/A 01/09/2014   Procedure: PERMANENT PACEMAKER INSERTION;  Surgeon: Deboraha Sprang, MD;  Location[facility] CATH LAB;  Service: Cardiovascular;  Laterality: N/A;  . RIGHT HEART CATH N/A 04/19/2018   Procedure: RIGHT HEART CATH;  Surgeon: Larey Dresser, MD;  Location: [place] CV LAB;  Service: Cardiovascular;  Laterality: N/A;  . RIGHT/LEFT HEART CATH AND CORONARY ANGIOGRAPHY N/A 10/19/2017   Procedure: RIGHT/LEFT HEART CATH  AND CORONARY ANGIOGRAPHY;  Surgeon: Wellington Hampshire, MD;  Location: [place] CV LAB;  Service: Cardiovascular;  Laterality: N/A;  . TEE WITHOUT CARDIOVERSION N/A 08/20/2017   Procedure: TRANSESOPHAGEAL ECHOCARDIOGRAM (TEE) & Direct current cardioversion;  Surgeon: Minna Merritts, MD;  Location: [facility] ORS;  Service: Cardiovascular;  Laterality: N/A;  . TRIGGER FINGER RELEASE    . VAGINAL HYSTERECTOMY      Allergies  Allergies  Allergen Reactions  . Aspirin Hives and Other (See Comments)    Pt states that she is unable to take because she had bleeding in her brain  . Other Other (See Comments)    Pt states that she is unable to take blood thinners because she had bleeding in her brain; Blood Thinners-per doctor at [facility]  . Penicillins Hives, Shortness Of Breath, Swelling and Other (See Comments)    Has patient had a PCN reaction causing immediate rash, facial/tongue/throat swelling, SOB or lightheadedness with hypotension: Yes Has patient had a PCN reaction causing severe rash involving mucus membranes or skin necrosis: No Has patient had a PCN reaction that required hospitalization No Has patient had a PCN reaction occurring within the last 10 years: No If all of the above answers are "NO", then may proceed with Cephalosporin use.    History of Present Illness    74 year old female with complex PMH as above and including lymphedema, COPD, pulmonary fibrosis, atrial fibrillation, history of DVT (2004), CAD, bradycardia, SAH, CKD, CVA, OSA on CPAP, HTN, DM2, PHTN, and chronic HFpEF.  2017 PFTs showed severe restriction.  Serological work-up from rheumatological disease was unremarkable.  There was past noted possible concern for burned-out sarcoidosis as a cause of her interstitial fibrosis and hilar adenopathy, as she does have family history of this.  It was noted that her diastolic heart failure was suspected to be primarily due to RV failure/cor pulmonale in the setting of  pulmonary hypertension.  She was admitted in October 2019 with acute on chronic diastolic heart failure with heart failure team consulted due to concerns for low output.  2019 echo showed EF 60 to 65% with moderate LVH, RV difficult to see.  She was classified as NYHA class III symptoms, likely primarily due to lung disease and pulmonary hypertension.  RHC done to evaluate PAH as below under CV studies.  Based on her 09/2017 cath, she had severe PAH with PVR 7.2WU.  VQ scan negative for PE.  High-resolution CT showed pulmonary fibrosis.  Pulmonary consult was performed with recommendation to stop amiodarone.  She was transitioned to torsemide.  Discharge weight 226 pounds.  Repeat RHC 03/2018 showed moderate pulmonary hypertension, PVR 3.4WU you, which was significantly lower than the 4/19 cath as above.  She was started on selexipag/Uptravi for pulmonary hypertension in addition to Opsumit/Macitentan and sildenafil/Viagra/ Revatio.   She was also on digoxin for RV support.  She was  seen in the office 01/20/2019 for follow-up of pulmonary hypertension and diastolic CHF.  At that time, she was not in atrial fibrillation (a paced).  She reported feeling short of breath while doing housework, and she stated that heat made her breathing worse.  No DOE walking around her house.  She reported significant fatigue.  EKG at that time was atrial paced with PVCs and nonspecific T wave flattening.   Her weight was down 19 pounds from her previous office visit.  She was still on home oxygen.  It was noted that her 6-minute walk was significantly improved.  She was also noted as suspected group 3 or 5 PH; however, it was noted it could not be ruled out as Group 1.  Recommendation was for repeat echo in 3 months, which per review of EMR, was not obtained.  It was recommended she continue to follow-up with pulmonary for ILD and questionable sarcoidosis.  She called the office 07/11/2019 for dizziness with RN note indicating  possible atrial tachycardia.  Per Dr. Caryl Comes, CV strip appeared to be atrial tachycardia at 2-1.  Recommendation was to be seen in the office if persistent symptoms.  Per review of paramedic visit on 1/18, she was reported.  She reported feeling dizzy with heart rate as low as 45 bpm during the day.  On 07/20/2019, it was noted that [facility] contacted the patient to inform her of an issue with [name], which per documentation, was later approved.  On 08/01/2019, it was noted that her most recent hemoglobin was 8.8 and suspected that her symptoms were 2/2 symptomatic anemia with recommendation for IV iron infusion or PRBC transfusion.  Patient was amenable to proceed with IV iron.  It was noted that the etiology of iron deficiency was unclear with questionable CKD versus other etiology.  Colonoscopy of March 2020 showed polyps.  FOBT pending.  If the etiology was unclear, recommendation was then for bone marrow biopsy.  It was noted they would check LDH, CBC, and multiple myeloma panel with reticulocyte and haptoglobin.  Review of results shows B12 was at the lower end of normal with recommendation for B12 and injections and once a month iron infusions.  Home Medications    Prior to Admission medications   Medication Sig Start Date End Date Taking? Authorizing Provider  Accu-Chek Softclix Lancets lancets TEST UP TO FOUR TIMES DAILY AS DIRECTED 05/30/19   [name], MD  acetaminophen (TYLENOL) 325 MG tablet Take 325-650 mg by mouth every 6 (six) hours as needed for mild pain. Reported on 10/10/2015    [provider]  albuterol (PROVENTIL) (2.5 MG/3ML) 0.083% nebulizer solution Take 3 mLs (2.5 mg total) by nebulization every 6 (six) hours. 08/28/17   Loletha Grayer, MD  amLODipine (NORVASC) 10 MG tablet Take 1 tablet (10 mg total) by mouth daily. Patient taking differently: Take 5 mg by mouth daily.  03/16/19   Larey Dresser, MD  apixaban (ELIQUIS) 5 MG TABS tablet Take 1 tablet (5 mg total)  by mouth 2 (two) times daily. 08/30/18   Larey Dresser, MD  Blood Glucose Monitoring Suppl (ACCU-CHEK AVIVA PLUS) w/Device KIT USE AS DIRECTED 11/11/18   [name], MD  cyanocobalamin 1000 MCG tablet Take 1,000 mcg by mouth daily. Daily in am 09/16/18 09/16/19  [provider]  diclofenac sodium (VOLTAREN) 1 % GEL Apply 2 g topically 4 (four) times daily as needed. 02/03/19   Jodelle Green, FNP  dicyclomine (BENTYL) 20 MG tablet  Take 20 mg by mouth every 6 (six) hours.    [provider]  digoxin (LANOXIN) 0.125 MG tablet Take 1 tablet by mouth once daily 05/26/19   Larey Dresser, MD  Dulaglutide ([name]) 8.00 LK/9.1PH SOPN INJECT 0.75MG SUBCUTANEOUSLY ONCE A WEEK 02/14/19   [name], MD  gabapentin (NEURONTIN) 100 MG capsule TAKE 2 CAPSULES BY MOUTH THREE TIMES DAILY 07/12/19   [name], MD  glucose blood (TRUE METRIX BLOOD GLUCOSE TEST) test strip Check blood glucose twice daily Dx:E11.8, Z79.4 09/17/17   [name], MD  glucose blood test strip Use as instructed 05/13/18   [name], MD  meclizine (ANTIVERT) 25 MG tablet Take 1 tablet (25 mg total) by mouth 3 (three) times daily as needed for dizziness. Patient not taking: Reported on 07/11/2019 07/08/18   [name], MD  Menthol, Topical Analgesic, (BIOFREEZE EX) Apply 1 application topically as needed (for pain).     [provider]  metFORMIN (GLUCOPHAGE) 500 MG tablet Take 1 tablet (500 mg total) by mouth daily with breakfast. Per Milbert Coulter 07/21/19   [name], MD  ondansetron (ZOFRAN) 8 MG tablet Take 1 tablet (8 mg total) by mouth every 8 (eight) hours as needed for nausea or vomiting. Patient not taking: Reported on 07/11/2019 09/25/14   Jackolyn Confer, MD  OPSUMIT 10 MG tablet TAKE 1 TABLET BY MOUTH EVERY DAY 05/16/19   Larey Dresser, MD  OXYGEN Inhale 4 L into the lungs continuous.    [provider]  potassium chloride SA (K-DUR) 20  MEQ tablet Take 1 tablet (20 mEq total) by mouth daily. 03/16/19   Larey Dresser, MD  rosuvastatin (CRESTOR) 10 MG tablet Take 1 tablet by mouth once daily 07/04/19   Larey Dresser, MD  Selexipag (UPTRAVI) 200 MCG TABS Take 4 tablets (800 mcg total) by mouth 2 (two) times daily. Rx for titration doses faxed. Currently on 831mg BID Patient taking differently: Take 1,000 mcg by mouth 2 (two) times daily. Rx for titration doses faxed. Currently on 10015m BID 06/16/19   McLarey DresserMD  sildenafil (REVATIO) 20 MG tablet Take 1 tablet (20 mg total) by mouth 3 (three) times daily. 07/11/19   McLarey DresserMD  spironolactone (ALDACTONE) 25 MG tablet Take 1 tablet (25 mg total) by mouth at bedtime. 04/28/19 07/27/19  McLarey DresserMD  torsemide (DEMADEX) 20 MG tablet TAKE 3 TABLETS BY MOUTH TWICE DAILY 04/15/19   McLarey DresserMD    Review of Systems    ***.  All other systems reviewed and are otherwise negative except as noted above.  Physical Exam    VS:  BP 112/60 (BP Location: Right Arm, Patient Position: Sitting, Cuff Size: Normal)   Pulse 72   Ht 5' (1.524 m)   Wt 180 lb (81.6 kg)   SpO2 98%   BMI 35.15 kg/m  , BMI Body mass index is 35.15 kg/m. GEN: Well nourished, well developed, in no acute distress. HEENT: normal. Neck: Supple, no JVD, carotid bruits, or masses. Cardiac: RRR, no murmurs, rubs, or gallops. No clubbing, cyanosis, edema.  Radials/DP/PT 2+ and equal bilaterally.  Respiratory:  Respirations regular and unlabored, clear to auscultation bilaterally. GI: Soft, nontender, nondistended, BS + x 4. MS: no deformity or atrophy. Skin: warm and dry, no rash. Neuro:  Strength and sensation are intact. Psych: Normal affect.  Accessory Clinical Findings    ECG personally reviewed by  me today - atrial paced, 72bpm, ectopy, artifact - no acute changes.  Filed Weights   08/04/19 1335  Weight: 180 lb (81.6 kg)     RHC  03/2018 1. Filling pressures  optimized.  2. Preserved cardiac output.  3. Moderate pulmonary hypertension.  PVR is significantly lower than the last cath, 3.4 WU.  Hemo Data  Most Recent Value  Fick Cardiac Output 6.26 L/min  Fick Cardiac Output Index 3.23 (L/min)/BSA  RA A Wave 10 mmHg  RA V Wave 8 mmHg  RA Mean 7 mmHg  RV Systolic Pressure 57 mmHg  RV Diastolic Pressure 6 mmHg  RV EDP 11 mmHg  PA Systolic Pressure 53 mmHg  PA Diastolic Pressure 22 mmHg  PA Mean 34 mmHg  PW A Wave 16 mmHg  PW V Wave 21 mmHg  PW Mean 12 mmHg  QP/QS 1  TPVR Index 11.14 HRUI   Echo 03/2018 - Left ventricle: The cavity size was normal. Wall thickness was  increased in a pattern of moderate LVH. Systolic function was  normal. The estimated ejection fraction was in the range of 60%  to 65%. Wall motion was normal; there were no regional wall  motion abnormalities. Left ventricular diastolic function  parameters were normal.  - Aortic valve: Sclerosis without stenosis.  - Mitral valve: There was mild regurgitation.  - Left atrium: The atrium was moderately dilated.  - Pulmonary arteries: PA peak pressure: 52 mm Hg (S).   Rhythm review 07/11/19 Reviewed transmission from 07/11/19. Normal PPM function. ATR episodes and NSVT episodes reviewed, most appear AT/A-flutter, all <71mn duration. 1 true NSVT episode noted on 06/30/19, 9 beats duration. Histograms stable since last transmission on 05/31/19. Spoke with patient. Advised that transmission reveals normal device function. Presenting rhythm indicates frequent PACs (not tracked due to falling in PVARP), known issue per Dr. KOlin PiaOV note from 08/24/2018. Pt reports she has been having intermittent episodes of dizziness with her pulse ox/BP monitor reading HRs as low as the 40s-50s. PPM base rate programmed at 60bpm. Pt has seen Dr. SCaryl Bisand he is running multiple tests per her report, but she has been advised to follow-up with our office, as well. Advised I will forward  message to Dr. KCaryl Comesfor review and recommendations. Pt verbalizes understanding and agreement with plan.  Labs 07/08/2019: Sodium 139, potassium 4.4, glucose 102, creatinine 1.39, BUN 26, calcium 9.4, AST 11, ALT 5, WBC 6.8, hemoglobin 9.2, RBC 3.74, hematocrit 29.4, platelets 260 08/01/2019 WBC 6.5, hemoglobin 8.3, RBC 3.49, hematocrit 29.1, platelets 241, B12 231, CRP 0.7, LDH 135, sodium 137, potassium 3.8, creatinine 1.16, BUN 24, glucose 94, alk phos 124, albumin 4.0, AST 15, ALT 10 07/15/19 iron 26, saturation ratio is 5.4, ferritin 11.2, transferrin 343.0, folate 9.1  Assessment & Plan    1.  ***   JArvil Chaco PA-C 08/04/2019, 5:51 PM

## 2019-08-04 ENCOUNTER — Ambulatory Visit (INDEPENDENT_AMBULATORY_CARE_PROVIDER_SITE_OTHER): Payer: Medicare HMO | Admitting: Physician Assistant

## 2019-08-04 ENCOUNTER — Other Ambulatory Visit: Payer: Self-pay

## 2019-08-04 ENCOUNTER — Encounter: Payer: Self-pay | Admitting: Physician Assistant

## 2019-08-04 VITALS — BP 112/60 | HR 72 | Ht 60.0 in | Wt 180.0 lb

## 2019-08-04 DIAGNOSIS — I5032 Chronic diastolic (congestive) heart failure: Secondary | ICD-10-CM | POA: Diagnosis not present

## 2019-08-04 DIAGNOSIS — R0789 Other chest pain: Secondary | ICD-10-CM

## 2019-08-04 DIAGNOSIS — N183 Chronic kidney disease, stage 3 unspecified: Secondary | ICD-10-CM

## 2019-08-04 DIAGNOSIS — E538 Deficiency of other specified B group vitamins: Secondary | ICD-10-CM

## 2019-08-04 DIAGNOSIS — I10 Essential (primary) hypertension: Secondary | ICD-10-CM

## 2019-08-04 DIAGNOSIS — E611 Iron deficiency: Secondary | ICD-10-CM

## 2019-08-04 DIAGNOSIS — R0602 Shortness of breath: Secondary | ICD-10-CM | POA: Diagnosis not present

## 2019-08-04 DIAGNOSIS — R55 Syncope and collapse: Secondary | ICD-10-CM

## 2019-08-04 DIAGNOSIS — I2721 Secondary pulmonary arterial hypertension: Secondary | ICD-10-CM

## 2019-08-04 DIAGNOSIS — R42 Dizziness and giddiness: Secondary | ICD-10-CM

## 2019-08-04 DIAGNOSIS — J9611 Chronic respiratory failure with hypoxia: Secondary | ICD-10-CM

## 2019-08-04 DIAGNOSIS — J449 Chronic obstructive pulmonary disease, unspecified: Secondary | ICD-10-CM

## 2019-08-04 DIAGNOSIS — I495 Sick sinus syndrome: Secondary | ICD-10-CM | POA: Diagnosis not present

## 2019-08-04 DIAGNOSIS — Z9989 Dependence on other enabling machines and devices: Secondary | ICD-10-CM

## 2019-08-04 DIAGNOSIS — Z950 Presence of cardiac pacemaker: Secondary | ICD-10-CM

## 2019-08-04 DIAGNOSIS — G4733 Obstructive sleep apnea (adult) (pediatric): Secondary | ICD-10-CM

## 2019-08-04 NOTE — Patient Instructions (Signed)
Medication Instructions:  No changes  *If you need a refill on your cardiac medications before your next appointment, please call your pharmacy*  Lab Work: Digoxin level done today  If you have labs (blood work) drawn today and your tests are completely normal, you will receive your results only by: [name] MyChart Message (if you have MyChart) OR . A paper copy in the mail If you have any lab test that is abnormal or we need to change your treatment, we will call you to review the results.  Testing/Procedures: Your physician has requested that you have an echocardiogram. Echocardiography is a painless test that uses sound waves to create images of your heart. It provides your doctor with information about the size and shape of your heart and how well your heart's chambers and valves are working. This procedure takes approximately one hour. There are no restrictions for this procedure.  Your physician has requested that you have a carotid duplex. This test is an ultrasound of the carotid arteries in your neck. It looks at blood flow through these arteries that supply the brain with blood. Allow one hour for this exam. There are no restrictions or special instructions.    Follow-Up: At [facility], you and your health needs are our priority.  As part of our continuing mission to provide you with exceptional heart care, we have created designated Provider Care Teams.  These Care Teams include your primary Cardiologist (physician) and Advanced Practice Providers (APPs -  Physician Assistants and Nurse Practitioners) who all work together to provide you with the care you need, when you need it.  Your next appointment:   1 month(s)  The format for your next appointment:   In Person  Provider:    You may see [name], MD or one of the following Advanced Practice Providers on your designated Care Team:    Murray Hodgkins, NP  Christell Faith, PA-C  Marrianne Mood, PA-C

## 2019-08-04 NOTE — Progress Notes (Signed)
Office Visit    Patient Name: Debra Saunders Date of Encounter: 08/04/2019  Primary Care Provider:  [name], MD  Primary Cardiologist:  [name], MD  Chief Complaint    74 year old female with history of chronic diastolic heart failure, nonobstructive CAD, atrial fibrillation on Eliquis, bradycardia s/p PPM [facility]), chronic respiratory failure on home O2, COPD, PAH, DM2, hypertension, CKD (required HD in the past), DVT s/p IVC filter 2004, hyperlipidemia, traumatic subarachnoid hemorrhage while on Xarelto 2014, CVA 2014, OSA, and chronic lymphedema, and here for 58-monthfollow-up for dizziness.  Past Medical History    Past Medical History:  Diagnosis Date   Arthritis    Atrial fibrillation, persistent ([place]    a. s/p TEE-guided DCCV on 08/20/2017   Cardiorenal syndrome    a. 09/2017 Creat rose to 2.7 w/ diuresis-->HD x 2.   Chest pain    a. 01/2012 Cath: nonobs dzs;  c. 04/2014 MV: No ischemia.   Chronic back pain    Chronic combined systolic (congestive) and diastolic (congestive) heart failure ([place]    a. Dx 2005 @ [facility];  b. 02/2014 Echo: Ef 55-60%, no rwma, mild MR, PASP 361mg. c. 07/2017: EF 30-35%, diffuse HK, trivial AI, severe MR, moderate TR; d. 09/2017 Echo: EF 40-45%, ant, antsep, ap, basal HK; e. 02/2018 Echo: EF 55-60%, Gr1 DD. Ao sclerosis w/o stenosis. Mild to mod MR. Mildly dil LA. Mildly reduced RV fxn. Mild TR. PASP 35-4017m.   Chronic respiratory failure (HCC)    a. on home O2   COPD (chronic obstructive pulmonary disease) ([place]CC[place]  a. Home O2 - 3lpm   Gastritis    History of intracranial hemorrhage    a. 6/14 described by neurosurgery as "small frontal posttraumatic SAH " - pt was on xarelto @ the time.   Hyperlipidemia    Hypertension    Lipoma of back    Lymphedema    a. Chronic LLE edema.   NICM (nonischemic cardiomyopathy) ([place]CC[place]  a. 09/2017 Echo: EF 40-45%; b. 09/2017 Cath: minor, insignificant CAD; c. 02/2018  Echo: EF 55-60%, Gr1 DD.   Obesity    PAH (pulmonary artery hypertension) ([place]CC[place]ev[place]  a. 09/2017 Echo: PASP 31m7m b. 09/2017 RHC: RA 23, RV 84/13, PA 84/29, PCWP 20, CO 3.89, CI 1.89. LVEDP 18.   Sepsis with metabolic encephalopathy (HCC)[place] Sleep apnea    Streptococcal bacteremia    [facility]oid [facility] ([facility])[place]/2014   Symptomatic bradycardia    a. s/p BSX PPM.   Thyroid nodule    Type II diabetes mellitus (HCC)[place] Urinary incontinence    Past Surgical History:  Procedure Laterality Date   [id number]   @ ARMCDover obstructive CAD: Only 20% ostial left circumflex.   CARDIOVERSION N/A 10/12/2017   Procedure: CARDIOVERSION;  Surgeon: GollMinna Merritts;  Location: [facility] ORS;  Service: Cardiovascular;  Laterality: N/A;   CHOLECYSTECTOMY     COLONOSCOPY WITH PROPOFOL N/A 09/10/2018   Procedure: COLONOSCOPY WITH PROPOFOL;  Surgeon: ElliManya Silvas;  [facility];  Service: Endoscopy;  Laterality: N/A;   CYST REMOVAL TRUNK  2002   BACK   DIALYSIS/PERMA CATHETER INSERTION N/A 10/06/2017   Procedure: DIALYSIS/PERMA CATHETER INSERTION;  Surgeon: SchnKatha Cabal;  [facility];  Service: Cardiovascular;  Laterality: N/A;   INSERT / REPLACE / REMOVE  PACEMAKER     lipoma removal  2014   back   PACEMAKER INSERTION  2015   [employer] dual chamber pacemaker implanted by Dr Caryl Comes for symptomatic bradycardia   PERMANENT PACEMAKER INSERTION N/A 01/09/2014   Procedure: PERMANENT PACEMAKER INSERTION;  Surgeon: Deboraha Sprang, MD;  Location: [facility] CATH LAB;  Service: Cardiovascular;  Laterality: N/A;   RIGHT HEART CATH N/A 04/19/2018   Procedure: RIGHT HEART CATH;  Surgeon: Larey Dresser, MD;  Location: [place] CV LAB;  Service: Cardiovascular;  Laterality: N/A;   RIGHT/LEFT HEART CATH AND CORONARY ANGIOGRAPHY N/A 10/19/2017   Procedure: RIGHT/LEFT HEART CATH  AND CORONARY ANGIOGRAPHY;  Surgeon: Wellington Hampshire, MD;  Location: [place] CV LAB;  Service: Cardiovascular;  Laterality: N/A;   TEE WITHOUT CARDIOVERSION N/A 08/20/2017   Procedure: TRANSESOPHAGEAL ECHOCARDIOGRAM (TEE) & Direct current cardioversion;  Surgeon: Minna Merritts, MD;  Location: [facility] ORS;  Service: Cardiovascular;  Laterality: N/A;   TRIGGER FINGER RELEASE     VAGINAL HYSTERECTOMY      Allergies  Allergies  Allergen Reactions   Aspirin Hives and Other (See Comments)    Pt states that she is unable to take because she had bleeding in her brain   Other Other (See Comments)    Pt states that she is unable to take blood thinners because she had bleeding in her brain; Blood Thinners-per doctor at [place], Shortness Of Breath, Swelling and Other (See Comments)    Has patient had a PCN reaction causing immediate rash, facial/tongue/throat swelling, SOB or lightheadedness with hypotension: Yes Has patient had a PCN reaction causing severe rash involving mucus membranes or skin necrosis: No Has patient had a PCN reaction that required hospitalization No Has patient had a PCN reaction occurring within the last 10 years: No If all of the above answers are "NO", then may proceed with Cephalosporin use.    History of Present Illness    74 year old female with complex PMH as above and including lymphedema, COPD, pulmonary fibrosis, atrial fibrillation, history of DVT (2004), CAD, bradycardia, SAH, CKD, CVA, OSA on CPAP, HTN, DM2, PHTN, and chronic HFpEF.  2017 PFTs showed severe restriction.  Serological work-up from rheumatological disease was unremarkable.  There was past noted possible concern for burned-out sarcoidosis as a cause of her interstitial fibrosis and hilar adenopathy, as she does have family history of this.  It was noted that her diastolic heart failure was suspected to be primarily due to RV failure/cor pulmonale in the setting of  pulmonary hypertension.  She was admitted in October 2019 with acute on chronic diastolic heart failure with heart failure team consulted due to concerns for low output.  2019 echo showed EF 60 to 65% with moderate LVH, RV difficult to see.  She was classified as NYHA class III symptoms, likely primarily due to lung disease and pulmonary hypertension.  RHC done to evaluate PAH as below under CV studies.  Based on her 09/2017 cath, she had severe PAH with PVR 7.2WU.  VQ scan negative for PE.  High-resolution CT showed pulmonary fibrosis.  Pulmonary consult was performed with recommendation to stop amiodarone.  She was transitioned to torsemide.  Discharge weight 226 pounds.  Repeat RHC 03/2018 showed moderate pulmonary hypertension, PVR 3.4WU you, which was significantly lower than the 4/19 cath as above.  She was started on selexipag/Uptravi for pulmonary hypertension in addition to Opsumit/Macitentan and sildenafil/Viagra/ Revatio.   She was also on digoxin for  RV support.  She was seen in the office 01/20/2019 for follow-up of pulmonary hypertension and diastolic CHF.  At that time, she was not in atrial fibrillation (a paced).  She reported feeling short of breath while doing housework, and she stated that heat made her breathing worse.  No DOE walking around her house.  She reported significant fatigue.  EKG at that time was atrial paced with PVCs and nonspecific T wave flattening.   Her weight was down 19 pounds from her previous office visit.  She was still on home oxygen.  It was noted that her 6-minute walk was significantly improved.  She was also noted as suspected group 3 or 5 PH; however, it was noted it could not be ruled out as Group 1.  Recommendation was for repeat echo in 3 months, which per review of EMR, was not obtained.  It was recommended she continue to follow-up with pulmonary for ILD and questionable sarcoidosis.  She called the office 07/11/2019 for dizziness with RN note indicating  possible atrial tachycardia.  Per Dr. Caryl Comes, CV strip appeared to be atrial tachycardia at 2-1.  Recommendation was to be seen in the office if persistent symptoms.  Per review of paramedic visit on 1/18, she was reported.  She reported feeling dizzy with heart rate as low as 45 bpm during the day.  On 07/20/2019, it was noted that [facility] contacted the patient to inform her of an issue with [name], which per documentation, was later approved.  On 08/01/2019, it was noted that her most recent hemoglobin was 8.8 and suspected that her symptoms were 2/2 symptomatic anemia with recommendation for IV iron infusion or PRBC transfusion.  Patient was amenable to proceed with IV iron.  It was noted that the etiology of iron deficiency was unclear with questionable CKD versus other etiology.  Colonoscopy of March 2020 showed polyps.  FOBT pending (with patient reporting today that it came back negative).  If the etiology was unclear, recommendation was then for bone marrow biopsy.  It was noted they would check LDH, CBC, and multiple myeloma panel with reticulocyte and haptoglobin.  Review of results shows B12 was at the lower end of normal with recommendation for B12 and injections and once a month iron infusions.  Today, she reports active CP rated 6 out of 10 in severity. The chest pain is located to the right of her sternum and radiates down under her right breast and through her ribs with radiation into her back. No radiation into her neck or arm. She reports that this chest pain is similar to that which she has experienced in the past; however, she states she has never reported it before at her previous appointments, as it has never bothered her to this extent.  She reports the chest pain usually waxes and wanes for sometimes months at a time with episodes usually anywhere from minutes in length to up to 30 minutes.  The pain is described as squeezing and sharp with soreness after an episode. It is not pleuritic or  positional. Usually, she will spend all day watching television and try not to move when this chest pain occurs, which helps somewhat. No clear triggers per patient. Associated symptoms include dizziness and shortness of breath/DOE.  With her CP episode today, frequently has sharper pain and is noticeably uncomfortable, wincing throughout the visit. Recommendation throughout the visit is to present to the ED, which she declined. She also notes left lower extremity edema, erythema, and warmth.  She reports that she has been taking her Eliquis 5 mg twice daily (per patient, restarted in the hospital despite history of SAH and workup for bleeding)  and has not missed any doses.  She denies any signs and symptoms of bleeding and reports her fecal occult came back negative. She is starting IV infusions this week for persistent low Hgb. She has also started B12 injections. She is followed by hematology / oncology.   Of note: Recommendations given current symptoms was to present to the emergency department. Reported active chest pain.  Long discussion regarding her sx and patient preference was to defer ED visit given risk of COVID exposure.  Home Medications    Prior to Admission medications   Medication Sig Start Date End Date Taking? Authorizing Provider  Accu-Chek Softclix Lancets lancets TEST UP TO FOUR TIMES DAILY AS DIRECTED 05/30/19   [name], MD  acetaminophen (TYLENOL) 325 MG tablet Take 325-650 mg by mouth every 6 (six) hours as needed for mild pain. Reported on 10/10/2015    [provider]  albuterol (PROVENTIL) (2.5 MG/3ML) 0.083% nebulizer solution Take 3 mLs (2.5 mg total) by nebulization every 6 (six) hours. 08/28/17   Loletha Grayer, MD  amLODipine (NORVASC) 10 MG tablet Take 1 tablet (10 mg total) by mouth daily. Patient taking differently: Take 5 mg by mouth daily.  03/16/19   Larey Dresser, MD  apixaban (ELIQUIS) 5 MG TABS tablet Take 1 tablet (5 mg total) by mouth 2  (two) times daily. 08/30/18   Larey Dresser, MD  Blood Glucose Monitoring Suppl (ACCU-CHEK AVIVA PLUS) w/Device KIT USE AS DIRECTED 11/11/18   [name], MD  cyanocobalamin 1000 MCG tablet Take 1,000 mcg by mouth daily. Daily in am 09/16/18 09/16/19  [provider]  diclofenac sodium (VOLTAREN) 1 % GEL Apply 2 g topically 4 (four) times daily as needed. 02/03/19   Jodelle Green, FNP  dicyclomine (BENTYL) 20 MG tablet Take 20 mg by mouth every 6 (six) hours.    [provider]  digoxin (LANOXIN) 0.125 MG tablet Take 1 tablet by mouth once daily 05/26/19   Larey Dresser, MD  Dulaglutide ([name]) 3.22 GU/5.4YH SOPN INJECT 0.75MG SUBCUTANEOUSLY ONCE A WEEK 02/14/19   [name], MD  gabapentin (NEURONTIN) 100 MG capsule TAKE 2 CAPSULES BY MOUTH THREE TIMES DAILY 07/12/19   [name], MD  glucose blood (TRUE METRIX BLOOD GLUCOSE TEST) test strip Check blood glucose twice daily Dx:E11.8, Z79.4 09/17/17   [name], MD  glucose blood test strip Use as instructed 05/13/18   [name], MD  meclizine (ANTIVERT) 25 MG tablet Take 1 tablet (25 mg total) by mouth 3 (three) times daily as needed for dizziness. Patient not taking: Reported on 07/11/2019 07/08/18   [name], MD  Menthol, Topical Analgesic, (BIOFREEZE EX) Apply 1 application topically as needed (for pain).     [provider]  metFORMIN (GLUCOPHAGE) 500 MG tablet Take 1 tablet (500 mg total) by mouth daily with breakfast. Per Milbert Coulter 07/21/19   [name], MD  ondansetron (ZOFRAN) 8 MG tablet Take 1 tablet (8 mg total) by mouth every 8 (eight) hours as needed for nausea or vomiting. Patient not taking: Reported on 07/11/2019 09/25/14   Jackolyn Confer, MD  OPSUMIT 10 MG tablet TAKE 1 TABLET BY MOUTH EVERY DAY 05/16/19   Larey Dresser, MD  OXYGEN Inhale 4 L into the lungs continuous.  [provider]  potassium chloride SA (K-DUR) 20 MEQ tablet  Take 1 tablet (20 mEq total) by mouth daily. 03/16/19   Larey Dresser, MD  rosuvastatin (CRESTOR) 10 MG tablet Take 1 tablet by mouth once daily 07/04/19   Larey Dresser, MD  Selexipag (UPTRAVI) 200 MCG TABS Take 4 tablets (800 mcg total) by mouth 2 (two) times daily. Rx for titration doses faxed. Currently on 839mg BID Patient taking differently: Take 1,000 mcg by mouth 2 (two) times daily. Rx for titration doses faxed. Currently on 10072m BID 06/16/19   McLarey DresserMD  sildenafil (REVATIO) 20 MG tablet Take 1 tablet (20 mg total) by mouth 3 (three) times daily. 07/11/19   McLarey DresserMD  spironolactone (ALDACTONE) 25 MG tablet Take 1 tablet (25 mg total) by mouth at bedtime. 04/28/19 07/27/19  McLarey DresserMD  torsemide (DEMADEX) 20 MG tablet TAKE 3 TABLETS BY MOUTH TWICE DAILY 04/15/19   McLarey DresserMD    Review of Systems    She denies palpitations, pnd, orthopnea, n, v,, weight gain, or early satiety.  She reports current/active chest pain, dyspnea, dizziness, edema.she reports an episode during which time she lost consciousness but was able to catch herself before she fell.  She has not hit her head. Near syncope worse with heat. She also reports amaurosis fugax and often visual sx such as flashing lights associated with her dizziness. all other systems reviewed and are otherwise negative except as noted above.  Physical Exam    VS:  BP 112/60 (BP Location: Right Arm, Patient Position: Sitting, Cuff Size: Normal)    Pulse 72    Ht 5' (1.524 m)    Wt 180 lb (81.6 kg)    SpO2 98%    BMI 35.15 kg/m  , BMI Body mass index is 35.15 kg/m. GEN: Obese, in no acute distress.  Frequently looks uncomfortable 2/2 pain. HEENT: normal.  Nasal cannula oxygen in place. Neck: Supple, JVD difficult to assess 2/2 body habitus, no carotid bruits, or masses. Cardiac:regular rate with with frequent extrasystole appreciated, no murmurs, rubs, or gallops. No clubbing, cyanosis.  Left  lower extremity with edema and slight erythema.  Radials/DP/PT 2+ and equal bilaterally.  Respiratory:  Bilateral reduced breath sounds, poor inspiratory effort GI: Soft, nontender, nondistended, BS + x 4. MS: no deformity or atrophy. Skin: warm and dry, no rash. Neuro:  Strength and sensation are intact. Psych: Normal affect.  At times, appears uncomfortable  Accessory Clinical Findings    ECG personally reviewed by me today - atrial paced, 72bpm, ectopy, artifact - no acute changes.  Filed Weights   08/04/19 1335  Weight: 180 lb (81.6 kg)     RHC  03/2018 1. Filling pressures optimized.  2. Preserved cardiac output.  3. Moderate pulmonary hypertension.  PVR is significantly lower than the last cath, 3.4 WU.  Hemo Data  Most Recent Value  Fick Cardiac Output 6.26 L/min  Fick Cardiac Output Index 3.23 (L/min)/BSA  RA A Wave 10 mmHg  RA V Wave 8 mmHg  RA Mean 7 mmHg  RV Systolic Pressure 57 mmHg  RV Diastolic Pressure 6 mmHg  RV EDP 11 mmHg  PA Systolic Pressure 53 mmHg  PA Diastolic Pressure 22 mmHg  PA Mean 34 mmHg  PW A Wave 16 mmHg  PW V Wave 21 mmHg  PW Mean 12 mmHg  QP/QS 1  TPVR Index 11.14 HRUI   Echo 03/2018 - Left ventricle:  The cavity size was normal. Wall thickness was  increased in a pattern of moderate LVH. Systolic function was  normal. The estimated ejection fraction was in the range of 60%  to 65%. Wall motion was normal; there were no regional wall  motion abnormalities. Left ventricular diastolic function  parameters were normal.  - Aortic valve: Sclerosis without stenosis.  - Mitral valve: There was mild regurgitation.  - Left atrium: The atrium was moderately dilated.  - Pulmonary arteries: PA peak pressure: 52 mm Hg (S).   Rhythm review 07/11/19 Reviewed transmission from 07/11/19. Normal PPM function. ATR episodes and NSVT episodes reviewed, most appear AT/A-flutter, all <80mn duration. 1 true NSVT episode noted on 06/30/19, 9 beats  duration. Histograms stable since last transmission on 05/31/19. Spoke with patient. Advised that transmission reveals normal device function. Presenting rhythm indicates frequent PACs (not tracked due to falling in PVARP), known issue per Dr. KOlin PiaOV note from 08/24/2018. Pt reports she has been having intermittent episodes of dizziness with her pulse ox/BP monitor reading HRs as low as the 40s-50s. PPM base rate programmed at 60bpm. Pt has seen Dr. SCaryl Bisand he is running multiple tests per her report, but she has been advised to follow-up with our office, as well. Advised I will forward message to Dr. KCaryl Comesfor review and recommendations. Pt verbalizes understanding and agreement with plan.  Labs 07/08/2019: Sodium 139, potassium 4.4, glucose 102, creatinine 1.39, BUN 26, calcium 9.4, AST 11, ALT 5, WBC 6.8, hemoglobin 9.2, RBC 3.74, hematocrit 29.4, platelets 260 08/01/2019 WBC 6.5, hemoglobin 8.3, RBC 3.49, hematocrit 29.1, platelets 241, B12 231, CRP 0.7, LDH 135, sodium 137, potassium 3.8, creatinine 1.16, BUN 24, glucose 94, alk phos 124, albumin 4.0, AST 15, ALT 10 07/15/19 iron 26, saturation ratio is 5.4, ferritin 11.2, transferrin 343.0, folate 9.1  Assessment & Plan    Current atypical chest pain History of nonobstructive CAD --Reports current chest pain, described as 6/10 in intensity, both squeezing and sharp, and with residual soreness but not TTP.  Reports that this chest pain has been ongoing for several years yet never reported, given it has never bothered her this much before.  Associated symptoms include shortness of breath and dizziness/near syncope, as well as visual changes.   --Given her symptoms, recommendation was to report to the emergency department for further work-up.  CP is somewhat atypical; however, cannot completely rule out CP 2/2 cardiac ischemia. Reassuring that she does note this CP as similar to past episodes and that it was been ongoing for several years. Also  reassuring is 2019 cath with minimal CAD. Considered also is CP 2/2 multiple comorbids including PAH, severe anemia, HFpEF, pulmonary fibrosis, OSA, +/- GI/MSK etiology.  --As above, patient [facility], offered repeatedly throughout with patient in noticeable pain. She continued to decline ED but reports that she will go to the emergency department later today if continued or worsening chest pain, dizziness, or shortness of breath after the visit.  Will update echo to reassess EF, wall motion, valves, and rule out acute structural changes. Continue current mediations with further recommendations if needed after her echo. Risk factor modification discussed, including continued statin.  Chronic diastolic CHF SOB/DOE Pulmonary hypertension --Reports shortness of breath and lower extremity edema, along with near syncope.  Volume status difficult to assess 2/2 body habitus; however, she does not appear significantly volume up.  Weight appears down from previous clinic visits but is patient reported given unable to get  her on the scale today. She continues to have class III symptoms, likely multifactorial in etiology and due to lung disease and PHTN. --As previously noted, suspicion is for primarily RV failure/cor pulmonale in the setting of her pulmonary hypertension.  09/2017 RHC Showed severe PAH with PVR 7.2 WU and improved on subsequent 03/2018 RHC to 3.4 WU. 03/2018 echo showed EF 60-65% and PASP 41mHg.  2017 PFTs showed severe restriction. CT 2019 showed pulmonary fibrosis and amiodarone was stopped previously given lung parenchymal disease. Previous notes have mentioned concern for burned-out sarcoidosis as well.  VQ scan in 2019 without PE.   --Given her symptoms, will update an echo and check a digoxin level.  Continue current medical management with torsemide, spironolactone, potassium supplementation, selexipag 8060mdaily, Opsumit, sildenafil citrate, and  digoxin.  OSA, COPD Chronic Respiratory failure on home O2 -Continue CPAP/oxygen. Consider as contributing to her sx of CP, SOB, presyncope. Recommend continue to follow-up with pulmonology.  Presyncope, Dizziness --As above. Update echo and obtain bilateral carotids. PPM functioning normally as copied and pasted above. Continue meclizine. Pulmonology and EP follow-up.  History of DVT s/p IVC filter (2004) LLE edema, Chronic Lymphedema --History of DVT and s/p IVC filter.  She has a history of subarachnoid hemorrhage on OACarnegiehowever, it appears that she was restarted on anticoagulation with Eliquis with close monitoring. Per patient, she has not missed any doses of this OACon[place] Previous VQ scan negative for chronic PE.  2019 lower extremity scan negative for DVT but did show signs of R venous occlusion.  She reports chronic intermittent asymmetric edema with known chronic lymphedema. Also considered is edema 2/2 third spacing 2/2 anemia and CKD. Compression stockings and elevation recommended. Consider updated venous or arterial scan of lower extremity if worsening sx or concerning for worsening vascular dz. Risk factor modification.  HTN --BP well controlled today.  Continue current medical management. She reports amlodipine was decreased to 56m27maily. Continue reduced dose amlodipine.  Paroxysmal atrial fibrillation PACs  2019 DCCV --A-paced with frequent ectopy, which could also be contributing to her sx. Denies any signs or symptoms of acute bleed.  Upcoming iron infusions, B12 injections. No amiodarone secondary to parenchymal lung disease.  Continue apixaban 5 mg twice daily with hematology/oncology closely monitoring Hgb.  Will check digoxin level. Continue to follow with EP.  Symptomatic bradycardia s/p PPM (BoCorporate investment banker-S/p pacemaker with frequent checks and most recent transmission and discussion as above.  Followed by Dr. KleCaryl ComesRecommend follow-up with Dr. KleCaryl Comes  scheduled.  Anemia with iron infusions, B12 injections History of SAH on [place] 2014 --IV infusions / B12 injections. Consider severe anemia as contributing to her symptoms. Will defer further workup and management to hematology / oncology. No reported s/sx of bleed reported toay on OACNewbern this time. No concerning neurologic sx on exam.   CKD --Continue to monitor renal function and electrolytes.  HLD --Continue statin.  DM2 --Strict glycemic control recommended. Diet and lifestyle discussed.   ---  [facility] active [facility] and presyncope/SOB.  Patient prefers to avoid the ED and declined.  [facility] or [facility].  Long discussion regarding current chest pain, given her history and reported symptoms.  Will plan to check digoxin level and update carotid ultrasound and echocardiogram.  Further recommendations at that time. Follow-up with EP, pulmonology.   JacArvil ChacoA-C 08/04/2019, 5:51 PM

## 2019-08-05 ENCOUNTER — Ambulatory Visit: Payer: Medicare HMO

## 2019-08-05 LAB — DIGOXIN LEVEL: Digoxin, Serum: 0.5 ng/mL (ref 0.5–0.9)

## 2019-08-08 ENCOUNTER — Other Ambulatory Visit (HOSPITAL_COMMUNITY): Payer: Self-pay | Admitting: Cardiology

## 2019-08-08 ENCOUNTER — Other Ambulatory Visit: Payer: Self-pay

## 2019-08-08 ENCOUNTER — Encounter: Payer: Self-pay | Admitting: Family Medicine

## 2019-08-08 ENCOUNTER — Ambulatory Visit (INDEPENDENT_AMBULATORY_CARE_PROVIDER_SITE_OTHER): Payer: Medicare HMO | Admitting: Family Medicine

## 2019-08-08 ENCOUNTER — Telehealth (HOSPITAL_COMMUNITY): Payer: Self-pay

## 2019-08-08 VITALS — Ht 60.0 in | Wt 178.0 lb

## 2019-08-08 DIAGNOSIS — N83201 Unspecified ovarian cyst, right side: Secondary | ICD-10-CM | POA: Diagnosis not present

## 2019-08-08 DIAGNOSIS — J849 Interstitial pulmonary disease, unspecified: Secondary | ICD-10-CM | POA: Diagnosis not present

## 2019-08-08 DIAGNOSIS — N83202 Unspecified ovarian cyst, left side: Secondary | ICD-10-CM | POA: Diagnosis not present

## 2019-08-08 DIAGNOSIS — N644 Mastodynia: Secondary | ICD-10-CM | POA: Diagnosis not present

## 2019-08-08 DIAGNOSIS — E1122 Type 2 diabetes mellitus with diabetic chronic kidney disease: Secondary | ICD-10-CM | POA: Diagnosis not present

## 2019-08-08 DIAGNOSIS — E611 Iron deficiency: Secondary | ICD-10-CM

## 2019-08-08 DIAGNOSIS — R0782 Intercostal pain: Secondary | ICD-10-CM | POA: Diagnosis not present

## 2019-08-08 DIAGNOSIS — I10 Essential (primary) hypertension: Secondary | ICD-10-CM | POA: Diagnosis not present

## 2019-08-08 MED ORDER — AMLODIPINE BESYLATE 10 MG PO TABS: 5.0000 mg | ORAL_TABLET | Freq: Every day | ORAL | 1 refills | Status: DC

## 2019-08-08 NOTE — Assessment & Plan Note (Signed)
Well-controlled.  She will continue her current regimen.  Discussed monitoring for lightheadedness.  Advised to contact us if she starts to have blood pressures that are lower than what she is getting now.

## 2019-08-08 NOTE — Progress Notes (Signed)
Virtual Visit via telephone Note  This visit type was conducted due to national recommendations for restrictions regarding the COVID-19 pandemic (e.g. social distancing).  This format is felt to be most appropriate for this patient at this time.  All issues noted in this document were discussed and addressed.  No physical exam was performed (except for noted visual exam findings with Video Visits).   I connected with Debra Saunders today at  2:45 PM EST by a video enabled telemedicine application and verified that I am speaking with the correct person using two identifiers. Location patient: home Location provider: work Persons participating in the virtual visit: patient, provider  I discussed the limitations, risks, security and privacy concerns of performing an evaluation and management service by telephone and the availability of in person appointments. I also discussed with the patient that there may be a patient responsible charge related to this service. The patient expressed understanding and agreed to proceed.  Interactive audio and video telecommunications were attempted between this provider and patient, however failed, due to patient having technical difficulties OR patient did not have access to video capability.  We continued and completed visit with audio only.   Reason for visit: f/u  HPI: Chest pain: Patient notes persistent issues with chest wall pain.  Notes that her soreness is described as if she had lifted something though she has not lifted anything.  Notes its been constant.  This pain has been going on for months.  Radiates to her right back.  Occurs on the right side of her chest.  Occasional wheezing and hoarseness though that resolves with a breathing treatment.  The chest pain does not resolve with the breathing treatment.  No cough or fever.  She saw cardiology and had a EKG.  They recommended ED evaluation to rule out cardiac cause.  Patient declined ED evaluation.   She continues on Eliquis and has not missed any doses.  No shortness of breath.  She also notes it hurts in her right breast at times.  She skipped her mammogram last year given the COVID-19 pandemic.  Anemia: She is undergoing iron infusions and B12 injections through hematology.  Diabetes: Ranging between 103 and 115 CBGs.  She has diarrhea possibly related to the Metformin.  Diarrhea occurs after she eats in the morning.  Hypertension: BPs have been 98-119/55-60 with a pulse of 58.  She has not had any lightheadedness.  Ovarian cyst: Noted on review of prior imaging through GI.  She notes no vaginal bleeding.  No abdominal or pelvic pain.  She reports no follow-up was done on these.   ROS: See pertinent positives and negatives per HPI.  Past Medical History:  Diagnosis Date  . Arthritis   . Atrial fibrillation, persistent ([place])    a. s/p TEE-guided DCCV on 08/20/2017  . Cardiorenal syndrome    a. 09/2017 Creat rose to 2.7 w/ diuresis-->HD x 2.  . Chest pain    a. 01/2012 Cath: nonobs dzs;  c. 04/2014 MV: No ischemia.  . Chronic back pain   . Chronic combined systolic (congestive) and diastolic (congestive) heart failure ([place])    a. Dx 2005 @ [facility];  b. 02/2014 Echo: Ef 55-60%, no rwma, mild MR, PASP 98mHg. c. 07/2017: EF 30-35%, diffuse HK, trivial AI, severe MR, moderate TR; d. 09/2017 Echo: EF 40-45%, ant, antsep, ap, basal HK; e. 02/2018 Echo: EF 55-60%, Gr1 DD. Ao sclerosis w/o stenosis. Mild to mod MR. Mildly dil LA. Mildly reduced RV  fxn. Mild TR. PASP 35-71mHg.  .Marland KitchenChronic respiratory failure (HCC)    a. on home O2  . COPD (chronic obstructive pulmonary disease) ([facility]ly[facility]    a. Home O2 - 3lpm  . Gastritis   . History of intracranial hemorrhage    a. 6/14 described by neurosurgery as "small frontal posttraumatic SAH " - pt was on xarelto @ the time.  . Hyperlipidemia   . Hypertension   . Lipoma of back   . Lymphedema    a. Chronic LLE edema.  .Marland KitchenNICM (nonischemic cardiomyopathy)  ([place]    a. 09/2017 Echo: EF 40-45%; b. 09/2017 Cath: minor, insignificant CAD; c. 02/2018 Echo: EF 55-60%, Gr1 DD.  .Marland KitchenObesity   . PAH (pulmonary artery hypertension) ([facility]    a. 09/2017 Echo: PASP 614mg; b. 09/2017 RHC: RA 23, RV 84/13, PA 84/29, PCWP 20, CO 3.89, CI 1.89. LVEDP 18.  . Sepsis with metabolic encephalopathy (HCRa[place]  . Sleep apnea   . Streptococcal bacteremia   . Stroke ([place]  . Subarachnoid hemorrhage (HCSt. [facility]7/30/2014  . Symptomatic bradycardia    a. s/p BSX PPM.  . Thyroid nodule   . Type II diabetes mellitus ([facility]  . Urinary incontinence     Past Surgical History:  Procedure Laterality Date  . ABDOMINAL HYSTERECTOMY  1969  . CARDIAC CATHETERIZATION  2013   [place]: Only 20% ostial left circumflex.  . Marland KitchenARDIOVERSION N/A 10/12/2017   Procedure: CARDIOVERSION;  Surgeon: GoMinna MerrittsMD;  Location: [facility] ORS;  Service: Cardiovascular;  Laterality: N/A;  . CHOLECYSTECTOMY    . COLONOSCOPY WITH PROPOFOL N/A 09/10/2018   Procedure: COLONOSCOPY WITH PROPOFOL;  Surgeon: ElManya SilvasMD;  [facility];  Service: Endoscopy;  Laterality: N/A;  . CYST REMOVAL TRUNK  2002   BACK  . DIALYSIS/PERMA CATHETER INSERTION N/A 10/06/2017   Procedure: DIALYSIS/PERMA CATHETER INSERTION;  Surgeon: ScKatha CabalMD;  Location: [facility];  Service: Cardiovascular;  Laterality: N/A;  . INSERT / REPLACE / REMOVE PACEMAKER    . lipoma removal  2014   back  . PACEMAKER INSERTION  [address] dual chamber pacemaker implanted by Dr KlCaryl Comesor symptomatic bradycardia  . PERMANENT PACEMAKER INSERTION N/A 01/09/2014   Procedure: PERMANENT PACEMAKER INSERTION;  Surgeon: StDeboraha SprangMD;  [facility];  Service: Cardiovascular;  Laterality: N/A;  . RIGHT HEART CATH N/A 04/19/2018   Procedure: RIGHT HEART CATH;  Surgeon: McLarey DresserMD;  Location: [facility];  Service: Cardiovascular;  Laterality: N/A;  .  RIGHT/LEFT HEART CATH AND CORONARY ANGIOGRAPHY N/A 10/19/2017   Procedure: RIGHT/LEFT HEART CATH AND CORONARY ANGIOGRAPHY;  Surgeon: ArWellington HampshireMD;  Location: [facility];  Service: Cardiovascular;  Laterality: N/A;  . TEE WITHOUT CARDIOVERSION N/A 08/20/2017   Procedure: TRANSESOPHAGEAL ECHOCARDIOGRAM (TEE) & Direct current cardioversion;  Surgeon: GoMinna MerrittsMD;  Location: [facility]S;  Service: Cardiovascular;  Laterality: N/A;  . TRIGGER FINGER RELEASE    . VAGINAL HYSTERECTOMY      Family History  Problem Relation Age of Onset  . Heart disease Mother   . Hypertension Mother   . COPD Mother        was a smoker  . Thyroid disease Mother   . Hypertension Father   . Hypertension Sister   . Thyroid disease Sister   . Hypertension Sister   . Thyroid disease Sister   .  Breast cancer Maternal Aunt   . Kidney disease Neg Hx   . Bladder Cancer Neg Hx     SOCIAL HX: Former smoker   Current Outpatient Medications:  .  Accu-Chek Softclix Lancets lancets, TEST UP TO FOUR TIMES DAILY AS DIRECTED, Disp: 400 each, Rfl: 0 .  acetaminophen (TYLENOL) 325 MG tablet, Take 325-650 mg by mouth every 6 (six) hours as needed for mild pain. Reported on 10/10/2015, Disp: , Rfl:  .  albuterol (PROVENTIL) (2.5 MG/3ML) 0.083% nebulizer solution, Take 3 mLs (2.5 mg total) by nebulization every 6 (six) hours., Disp: 75 mL, Rfl: 12 .  amLODipine (NORVASC) 10 MG tablet, Take 0.5 tablets (5 mg total) by mouth daily., Disp: 90 tablet, Rfl: 1 .  apixaban (ELIQUIS) 5 MG TABS tablet, Take 1 tablet (5 mg total) by mouth 2 (two) times daily., Disp: 60 tablet, Rfl: 11 .  Blood Glucose Monitoring Suppl (ACCU-CHEK AVIVA PLUS) w/Device KIT, USE AS DIRECTED, Disp: 1 kit, Rfl: 0 .  cyanocobalamin 1000 MCG tablet, Take 1,000 mcg by mouth daily. Daily in am, Disp: , Rfl:  .  diclofenac sodium (VOLTAREN) 1 % GEL, Apply 2 g topically 4 (four) times daily as needed., Disp: 50 g, Rfl: 0 .  dicyclomine  (BENTYL) 20 MG tablet, Take 20 mg by mouth every 6 (six) hours., Disp: , Rfl:  .  digoxin (LANOXIN) 0.125 MG tablet, Take 1 tablet by mouth once daily, Disp: 90 tablet, Rfl: 0 .  Dulaglutide ([name]) 8.28 MK/3.4JZ SOPN, INJECT 0.75MG SUBCUTANEOUSLY ONCE A WEEK, Disp: 4 pen, Rfl: 2 .  gabapentin (NEURONTIN) 100 MG capsule, TAKE 2 CAPSULES BY MOUTH THREE TIMES DAILY, Disp: 180 capsule, Rfl: 0 .  glucose blood (TRUE METRIX BLOOD GLUCOSE TEST) test strip, Check blood glucose twice daily Dx:E11.8, Z79.4, Disp: 100 each, Rfl: 12 .  meclizine (ANTIVERT) 25 MG tablet, Take 1 tablet (25 mg total) by mouth 3 (three) times daily as needed for dizziness., Disp: 30 tablet, Rfl: 0 .  Menthol, Topical Analgesic, (BIOFREEZE EX), Apply 1 application topically as needed (for pain). , Disp: , Rfl:  .  ondansetron (ZOFRAN) 8 MG tablet, Take 1 tablet (8 mg total) by mouth every 8 (eight) hours as needed for nausea or vomiting., Disp: 30 tablet, Rfl: 2 .  OPSUMIT 10 MG tablet, TAKE 1 TABLET BY MOUTH EVERY DAY, Disp: 30 tablet, Rfl: 3 .  OXYGEN, Inhale 4 L into the lungs continuous., Disp: , Rfl:  .  potassium chloride SA (K-DUR) 20 MEQ tablet, Take 1 tablet (20 mEq total) by mouth daily., Disp: 90 tablet, Rfl: 3 .  rosuvastatin (CRESTOR) 10 MG tablet, Take 1 tablet by mouth once daily, Disp: 90 tablet, Rfl: 0 .  Selexipag 200 MCG TABS, Take 2 tablets (400MCG) by mouth TWICE a day, Disp: , Rfl:  .  sildenafil (REVATIO) 20 MG tablet, Take 1 tablet (20 mg total) by mouth 3 (three) times daily., Disp: 270 tablet, Rfl: 3 .  torsemide (DEMADEX) 20 MG tablet, TAKE 3 TABLETS BY MOUTH TWICE DAILY, Disp: 540 tablet, Rfl: 0 .  UPTRAVI 800 MCG TABS, Take 800 mcg by mouth daily., Disp: , Rfl:  .  spironolactone (ALDACTONE) 25 MG tablet, Take 1 tablet (25 mg total) by mouth at bedtime., Disp: 30 tablet, Rfl: 6  EXAM: This is a telehealth telephone visit and thus no physical exam was completed.  ASSESSMENT AND PLAN:  Discussed  the following assessment and plan:  Hypertension Well-controlled.  She will continue her  current regimen.  Discussed monitoring for lightheadedness.  Advised to contact us if she starts to have blood pressures that are lower than what she is getting now.  Iron deficiency She will continue treatment through hematology.  Diabetes ([place]) Has been very well controlled.  She will stop Metformin and we will follow-up in 2 to 3 weeks to see if her diarrhea has improved.  Bilateral ovarian cysts Pelvic ultrasound ordered.  Discussed potential need to see GYN.  Pain in the chest Patient with likely chest wall pain.  Potentially musculoskeletal though given persistence we will obtain a CT scan to rule out underlying cause.  We will also obtain a mammogram to rule out a breast cause as well.  Advised that she should go to the emergency room for evaluation if she developed any worsening symptoms or shortness of breath.  Breast pain, right Mammogram and ultrasound ordered.   Orders Placed This Encounter  Procedures  . CT Chest Wo Contrast    Standing Status:   Future    Standing Expiration Date:   10/05/2020    Order Specific Question:   Preferred imaging location?    Answer:   [facility]    Order Specific Question:   Radiology Contrast Protocol - do NOT remove file path    Answer:   [email or web address]  . MM DIAG BREAST TOMO BILATERAL    Standing Status:   Future    Standing Expiration Date:   10/05/2020    Order Specific Question:   Reason for Exam (SYMPTOM  OR DIAGNOSIS REQUIRED)    Answer:   right breast pain    Order Specific Question:   Preferred imaging location?    Answer:   [facility]  . US BREAST LTD UNI LEFT INC AXILLA    Standing Status:   Future    Standing Expiration Date:   10/05/2020    Order Specific Question:   Reason for Exam (SYMPTOM  OR DIAGNOSIS REQUIRED)    Answer:   right breast pain    Order Specific Question:   Preferred  imaging location?    Answer:   [facility]  . US BREAST LTD UNI RIGHT INC AXILLA    Standing Status:   Future    Standing Expiration Date:   10/05/2020    Order Specific Question:   Reason for Exam (SYMPTOM  OR DIAGNOSIS REQUIRED)    Answer:   right breast pain    Order Specific Question:   Preferred imaging location?    Answer:   [facility]  . US Pelvic Complete With Transvaginal    Standing Status:   Future    Standing Expiration Date:   10/05/2020    Order Specific Question:   Reason for Exam (SYMPTOM  OR DIAGNOSIS REQUIRED)    Answer:   possible bilateral ovarian cysts on prior CT scan    Order Specific Question:   Preferred imaging location?    Answer:   [facility] ordered this encounter  Medications  . amLODipine (NORVASC) 10 MG tablet    Sig: Take 0.5 tablets (5 mg total) by mouth daily.    Dispense:  90 tablet    Refill:  1     I discussed the assessment and treatment plan with the patient. The patient was provided an opportunity to ask questions and all were answered. The patient agreed with the plan and demonstrated an understanding of the instructions.   The patient was advised to call back  or seek an in-person evaluation if the symptoms worsen or if the condition fails to improve as anticipated.  I provided 19 minutes of non-face-to-face time during this encounter.   [name], MD

## 2019-08-08 NOTE — Assessment & Plan Note (Signed)
Has been very well controlled.  She will stop Metformin and we will follow-up in 2 to 3 weeks to see if her diarrhea has improved.

## 2019-08-08 NOTE — Assessment & Plan Note (Signed)
Mammogram and ultrasound ordered

## 2019-08-08 NOTE — Assessment & Plan Note (Addendum)
Patient with likely chest wall pain.  Potentially musculoskeletal though given persistence we will obtain a CT scan to rule out underlying cause.  We will also obtain a mammogram to rule out a breast cause as well.  Advised that she should go to the emergency room for evaluation if she developed any worsening symptoms or shortness of breath.

## 2019-08-08 NOTE — Assessment & Plan Note (Signed)
Pelvic ultrasound ordered.  Discussed potential need to see GYN.

## 2019-08-08 NOTE — Telephone Encounter (Signed)
Contacted Debra Saunders to go over her appts scheduled and she is aware of them.  Advised her will make a home visit on Thursday.  She states she will be out of uptravi on Thursday, will call and get refills on it.  Will visit for heart failure.  [place] [phone]

## 2019-08-08 NOTE — Assessment & Plan Note (Signed)
She will continue treatment through hematology.

## 2019-08-09 ENCOUNTER — Telehealth: Payer: Self-pay | Admitting: Family Medicine

## 2019-08-09 ENCOUNTER — Other Ambulatory Visit: Payer: Self-pay

## 2019-08-09 ENCOUNTER — Inpatient Hospital Stay: Payer: Medicare HMO

## 2019-08-09 VITALS — BP 121/77 | HR 60 | Resp 18

## 2019-08-09 DIAGNOSIS — R5383 Other fatigue: Secondary | ICD-10-CM | POA: Diagnosis not present

## 2019-08-09 DIAGNOSIS — I130 Hypertensive heart and chronic kidney disease with heart failure and stage 1 through stage 4 chronic kidney disease, or unspecified chronic kidney disease: Secondary | ICD-10-CM | POA: Diagnosis not present

## 2019-08-09 DIAGNOSIS — R0602 Shortness of breath: Secondary | ICD-10-CM | POA: Diagnosis not present

## 2019-08-09 DIAGNOSIS — I5042 Chronic combined systolic (congestive) and diastolic (congestive) heart failure: Secondary | ICD-10-CM | POA: Diagnosis not present

## 2019-08-09 DIAGNOSIS — E538 Deficiency of other specified B group vitamins: Secondary | ICD-10-CM

## 2019-08-09 DIAGNOSIS — J449 Chronic obstructive pulmonary disease, unspecified: Secondary | ICD-10-CM | POA: Diagnosis not present

## 2019-08-09 DIAGNOSIS — E611 Iron deficiency: Secondary | ICD-10-CM

## 2019-08-09 DIAGNOSIS — D649 Anemia, unspecified: Secondary | ICD-10-CM | POA: Diagnosis not present

## 2019-08-09 DIAGNOSIS — E785 Hyperlipidemia, unspecified: Secondary | ICD-10-CM | POA: Diagnosis not present

## 2019-08-09 DIAGNOSIS — R5381 Other malaise: Secondary | ICD-10-CM | POA: Diagnosis not present

## 2019-08-09 DIAGNOSIS — D509 Iron deficiency anemia, unspecified: Secondary | ICD-10-CM | POA: Diagnosis not present

## 2019-08-09 MED ORDER — IRON SUCROSE 20 MG/ML IV SOLN
200.0000 mg | Freq: Once | INTRAVENOUS | Status: AC
  Administered 2019-08-09: 14:00:00 200 mg via INTRAVENOUS
  Filled 2019-08-09: qty 10

## 2019-08-09 MED ORDER — SODIUM CHLORIDE 0.9 % IV SOLN
Freq: Once | INTRAVENOUS | Status: AC
  Filled 2019-08-09: qty 250

## 2019-08-09 MED ORDER — CYANOCOBALAMIN 1000 MCG/ML IJ SOLN
1000.0000 ug | Freq: Once | INTRAMUSCULAR | Status: AC
  Administered 2019-08-09: 1000 ug via INTRAMUSCULAR
  Filled 2019-08-09: qty 1

## 2019-08-09 MED ORDER — HEPARIN SOD (PORK) LOCK FLUSH 100 UNIT/ML IV SOLN
INTRAVENOUS | Status: AC
  Filled 2019-08-09: qty 5

## 2019-08-09 NOTE — Telephone Encounter (Signed)
Lvm for pt to schedule 2 week follow up for stopping Metformin

## 2019-08-11 ENCOUNTER — Other Ambulatory Visit (HOSPITAL_COMMUNITY): Payer: Self-pay

## 2019-08-11 ENCOUNTER — Other Ambulatory Visit (HOSPITAL_COMMUNITY): Payer: Self-pay | Admitting: Cardiology

## 2019-08-11 ENCOUNTER — Encounter (HOSPITAL_COMMUNITY): Payer: Self-pay

## 2019-08-11 ENCOUNTER — Other Ambulatory Visit: Payer: Self-pay | Admitting: Family Medicine

## 2019-08-11 NOTE — Progress Notes (Signed)
Today had a home visit with [name].  She states they delivered her Uptravi at 1 am this morning.  They only sent her enough for a week and half. They advised insurance has not approved more meds yet, they will call and let her know when approved.  Calling to find out if they are sending more.  She also needs refills on torsemide and gabapentin.  meds verified.  She advised Dr Milbert Coulter ordered a new glucometer kit for her but she has no test strips, will contact them and get script for test strips.  She states feeling ok today.  She states been craving junk food.  Her weight is staying close to 180's.  She denies any chest pain, shortness of breath, headaches or dizziness.  Blood pressure and pulse has been good.  She is aware of appts coming up.  Her iron is low.  She is not eating much at meals and she is eating hardly any meats.  Explained importance of getting enough foods that has iron and protein in them.  Will continue to visit for heart failure, diet and medication compliance.   [place] [phone]

## 2019-08-12 ENCOUNTER — Other Ambulatory Visit: Payer: Self-pay | Admitting: *Deleted

## 2019-08-12 ENCOUNTER — Ambulatory Visit: Payer: Medicare HMO

## 2019-08-12 DIAGNOSIS — E538 Deficiency of other specified B group vitamins: Secondary | ICD-10-CM

## 2019-08-12 DIAGNOSIS — E611 Iron deficiency: Secondary | ICD-10-CM

## 2019-08-15 ENCOUNTER — Other Ambulatory Visit: Payer: Self-pay | Admitting: Pharmacy Technician

## 2019-08-15 ENCOUNTER — Inpatient Hospital Stay: Payer: Medicare HMO

## 2019-08-15 ENCOUNTER — Encounter: Payer: Self-pay | Admitting: Internal Medicine

## 2019-08-15 ENCOUNTER — Inpatient Hospital Stay (HOSPITAL_BASED_OUTPATIENT_CLINIC_OR_DEPARTMENT_OTHER): Payer: Medicare HMO | Admitting: Internal Medicine

## 2019-08-15 ENCOUNTER — Other Ambulatory Visit: Payer: Self-pay

## 2019-08-15 VITALS — BP 121/72 | HR 60 | Resp 19

## 2019-08-15 DIAGNOSIS — I5042 Chronic combined systolic (congestive) and diastolic (congestive) heart failure: Secondary | ICD-10-CM | POA: Diagnosis not present

## 2019-08-15 DIAGNOSIS — I130 Hypertensive heart and chronic kidney disease with heart failure and stage 1 through stage 4 chronic kidney disease, or unspecified chronic kidney disease: Secondary | ICD-10-CM | POA: Diagnosis not present

## 2019-08-15 DIAGNOSIS — D649 Anemia, unspecified: Secondary | ICD-10-CM | POA: Diagnosis not present

## 2019-08-15 DIAGNOSIS — E611 Iron deficiency: Secondary | ICD-10-CM | POA: Diagnosis not present

## 2019-08-15 DIAGNOSIS — R0602 Shortness of breath: Secondary | ICD-10-CM | POA: Diagnosis not present

## 2019-08-15 DIAGNOSIS — E785 Hyperlipidemia, unspecified: Secondary | ICD-10-CM | POA: Diagnosis not present

## 2019-08-15 DIAGNOSIS — E538 Deficiency of other specified B group vitamins: Secondary | ICD-10-CM

## 2019-08-15 DIAGNOSIS — R5381 Other malaise: Secondary | ICD-10-CM | POA: Diagnosis not present

## 2019-08-15 DIAGNOSIS — D509 Iron deficiency anemia, unspecified: Secondary | ICD-10-CM | POA: Diagnosis not present

## 2019-08-15 DIAGNOSIS — J449 Chronic obstructive pulmonary disease, unspecified: Secondary | ICD-10-CM | POA: Diagnosis not present

## 2019-08-15 DIAGNOSIS — R5383 Other fatigue: Secondary | ICD-10-CM | POA: Diagnosis not present

## 2019-08-15 LAB — CBC WITH DIFFERENTIAL/PLATELET
Abs Immature Granulocytes: 0.02 10*3/uL (ref 0.00–0.07)
Basophils Absolute: 0 10*3/uL (ref 0.0–0.1)
Basophils Relative: 1 %
Eosinophils Absolute: 0.2 10*3/uL (ref 0.0–0.5)
Eosinophils Relative: 3 %
HCT: 29.7 % — ABNORMAL LOW (ref 36.0–46.0)
Hemoglobin: 8.3 g/dL — ABNORMAL LOW (ref 12.0–15.0)
Immature Granulocytes: 0 %
Lymphocytes Relative: 28 %
Lymphs Abs: 1.6 10*3/uL (ref 0.7–4.0)
MCH: 24 pg — ABNORMAL LOW (ref 26.0–34.0)
MCHC: 27.9 g/dL — ABNORMAL LOW (ref 30.0–36.0)
MCV: 85.8 fL (ref 80.0–100.0)
Monocytes Absolute: 0.3 10*3/uL (ref 0.1–1.0)
Monocytes Relative: 5 %
Neutro Abs: 3.5 10*3/uL (ref 1.7–7.7)
Neutrophils Relative %: 63 %
Platelets: 210 10*3/uL (ref 150–400)
RBC: 3.46 MIL/uL — ABNORMAL LOW (ref 3.87–5.11)
RDW: 15.4 % (ref 11.5–15.5)
WBC: 5.5 10*3/uL (ref 4.0–10.5)
nRBC: 0 % (ref 0.0–0.2)

## 2019-08-15 MED ORDER — SODIUM CHLORIDE 0.9 % IV SOLN
Freq: Once | INTRAVENOUS | Status: AC
  Filled 2019-08-15: qty 250

## 2019-08-15 MED ORDER — IRON SUCROSE 20 MG/ML IV SOLN
200.0000 mg | Freq: Once | INTRAVENOUS | Status: AC
  Administered 2019-08-15: 200 mg via INTRAVENOUS
  Filled 2019-08-15: qty 10

## 2019-08-15 MED ORDER — CYANOCOBALAMIN 1000 MCG/ML IJ SOLN
1000.0000 ug | Freq: Once | INTRAMUSCULAR | Status: AC
  Administered 2019-08-15: 1000 ug via INTRAMUSCULAR
  Filled 2019-08-15: qty 1

## 2019-08-15 NOTE — Progress Notes (Signed)
[place] NOTE  Patient Care Team: [name], MD as PCP - General (Family Medicine) Wellington Hampshire, MD as PCP - Cardiology (Cardiology) Thom Chimes, MD as Referring Physician (Allergy) Juanito Doom, MD as Referring Physician (Internal Medicine) De Hollingshead, [facility] as Pharmacist (Pharmacist) [place], Luiz Ochoa, CPhT as [place] Management (Pharmacy Technician) Anemia CHIEF COMPLAINTS/PURPOSE OF CONSULTATION:    HEMATOLOGY HISTORY  # IRON DEFICIENCY ANEMIA-Jan today 21 PCP Hb-8.8; Antonieta Pert sat-5%; ferritin 11] EGD-; colonoscopy MARCH 2020 -polyp [facility] GI-hold further work-up secondary to cardiac issues]; remote-EGD-?;  February 2021-LDH/haptoglobin normal; kappa lambda light chain ratio/M panel normal;  on Venofer; November 2020-CT scan question early cirrhosis/no splenomegaly.  No acute process  #2014 -question blood transfusion  [patient admitted for stroke]; CHF chronic -[ 2004 on 4 lits; Dr.Arida/Klein]; walker; CKD stage III-   HISTORY OF PRESENTING ILLNESS:  Debra Saunders 74 y.o.  female worsening anemia iron deficiency over the last many months is is here for follow-up.  Patient is currently status post IV iron x1; no infusion reaction noted.  Patient continues to be tired.  Shortness of breath on exertion.  No blood in stools or black or stools.   Review of Systems  Constitutional: Positive for malaise/fatigue. Negative for chills, diaphoresis, fever and weight loss.  HENT: Negative for nosebleeds and sore throat.   Eyes: Negative for double vision.  Respiratory: Positive for shortness of breath. Negative for cough, hemoptysis, sputum production and wheezing.   Cardiovascular: Negative for chest pain, palpitations, orthopnea and leg swelling.  Gastrointestinal: Negative for abdominal pain, blood in stool, constipation, diarrhea, heartburn, melena, nausea and vomiting.  Genitourinary: Negative for dysuria,  frequency and urgency.  Musculoskeletal: Negative for back pain and joint pain.  Skin: Negative.  Negative for itching and rash.  Neurological: Negative for dizziness, tingling, focal weakness, weakness and headaches.  Endo/Heme/Allergies: Does not bruise/bleed easily.  Psychiatric/Behavioral: Negative for depression. The patient is not nervous/anxious and does not have insomnia.     MEDICAL HISTORY:  Past Medical History:  Diagnosis Date  . Arthritis   . Atrial fibrillation, persistent ([place])    a. s/p TEE-guided DCCV on 08/20/2017  . Cardiorenal syndrome    a. 09/2017 Creat rose to 2.7 w/ diuresis-->HD x 2.  . Chest pain    a. 01/2012 Cath: nonobs dzs;  c. 04/2014 MV: No ischemia.  . Chronic back pain   . Chronic combined systolic (congestive) and diastolic (congestive) heart failure ([place])    a. Dx 2005 @ [facility];  b. 02/2014 Echo: Ef 55-60%, no rwma, mild MR, PASP 31mHg. c. 07/2017: EF 30-35%, diffuse HK, trivial AI, severe MR, moderate TR; d. 09/2017 Echo: EF 40-45%, ant, antsep, ap, basal HK; e. 02/2018 Echo: EF 55-60%, Gr1 DD. Ao sclerosis w/o stenosis. Mild to mod MR. Mildly dil LA. Mildly reduced RV fxn. Mild TR. PASP 35-439mg.  . Marland Kitchenhronic respiratory failure (HCC)    a. on home O2  . COPD (chronic obstructive pulmonary disease) ([place]   a. Home O2 - 3lpm  . Gastritis   . History of intracranial hemorrhage    a. 6/14 described by neurosurgery as "small frontal posttraumatic SAH " - pt was on xarelto @ the time.  . Hyperlipidemia   . Hypertension   . Lipoma of back   . Lymphedema    a. Chronic LLE edema.  . Marland KitchenICM (nonischemic cardiomyopathy) (HCC[place]   a. 09/2017 Echo: EF 40-45%; b. 09/2017 Cath:  minor, insignificant CAD; c. 02/2018 Echo: EF 55-60%, Gr1 DD.  [name] Obesity   . PAH (pulmonary artery hypertension) ([place])    a. 09/2017 Echo: PASP 80mHg; b. 09/2017 RHC: RA 23, RV 84/13, PA 84/29, PCWP 20, CO 3.89, CI 1.89. LVEDP 18.  . Sepsis with metabolic encephalopathy ([place]   . Sleep apnea    . Streptococcal bacteremia   . Stroke (HCrane   . Subarachnoid hemorrhage ([place] 01/19/2013  . Symptomatic bradycardia    a. s/p BSX PPM.  . Thyroid nodule   . Type II diabetes mellitus ([place]Pro[place]   . Urinary incontinence     SURGICAL HISTORY: Past Surgical History:  Procedure Laterality Date  . ABDOMINAL HYSTERECTOMY  1969  . CARDIAC CATHETERIZATION  2013   @ [facility] No obstructive CAD: Only 20% ostial left circumflex.  .Marland KitchenCARDIOVERSION N/A 10/12/2017   Procedure: CARDIOVERSION;  Surgeon: GMinna Merritts MD;  Location: [facility] ORS;  Service: Cardiovascular;  Laterality: N/A;  . CHOLECYSTECTOMY    . COLONOSCOPY WITH PROPOFOL N/A 09/10/2018   Procedure: COLONOSCOPY WITH PROPOFOL;  Surgeon: EManya Silvas MD;  [facility];  Service: Endoscopy;  Laterality: N/A;  . CYST REMOVAL TRUNK  2002   BACK  . DIALYSIS/PERMA CATHETER INSERTION N/A 10/06/2017   Procedure: DIALYSIS/PERMA CATHETER INSERTION;  Surgeon: SKatha Cabal MD;  [facility];  Service: Cardiovascular;  Laterality: N/A;  . INSERT / REPLACE / REMOVE PACEMAKER    . lipoma removal  2014   back  . PACEMAKER INSERTION  [address]Scientific dual chamber pacemaker implanted by Dr KCaryl Comesfor symptomatic bradycardia  . PERMANENT PACEMAKER INSERTION N/A 01/09/2014   Procedure: PERMANENT PACEMAKER INSERTION;  Surgeon: SDeboraha Sprang MD;  [facility];  Service: Cardiovascular;  Laterality: N/A;  . RIGHT HEART CATH N/A 04/19/2018   Procedure: RIGHT HEART CATH;  Surgeon: MLarey Dresser MD;  [facility];  Service: Cardiovascular;  Laterality: N/A;  . RIGHT/LEFT HEART CATH AND CORONARY ANGIOGRAPHY N/A 10/19/2017   Procedure: RIGHT/LEFT HEART CATH AND CORONARY ANGIOGRAPHY;  Surgeon: AWellington Hampshire MD;  [facility];  Service: Cardiovascular;  Laterality: N/A;  . TEE WITHOUT CARDIOVERSION N/A 08/20/2017   Procedure: TRANSESOPHAGEAL ECHOCARDIOGRAM (TEE) & Direct  current cardioversion;  Surgeon: GMinna Merritts MD;  Location: [facility] ORS;  Service: Cardiovascular;  Laterality: N/A;  . TRIGGER FINGER RELEASE    . VAGINAL HYSTERECTOMY      SOCIAL HISTORY: Social History   Socioeconomic History  . Marital status: Divorced    Spouse name: Not on file  . Number of children: 4  . Years of education: 121 . Highest education level: High school graduate  Occupational History  . Occupation: Retired- factory work    Comment: exposed to fPharmacist, community Tobacco Use  . Smoking status: Former Smoker    Packs/day: 0.30    Years: 2.00    Pack years: 0.60    Types: Cigarettes    Quit date: 06/23/1990    Years since quitting: 29.1  . Smokeless tobacco: Never Used  Substance and Sexual Activity  . Alcohol use: No  . Drug use: No  . Sexual activity: Not Currently  Other Topics Concern  . Not on file  Social History Narrative   [place] 4 children. No pets.      Work - yRestaurant manager, fast food retired      Diet - regular  02/14/2019: reports has Meals On Wheels delivered to her home;       Remote hx of smoking > 30 years; No alcohol-    Social Determinants of Health   Financial Resource Strain:   . Difficulty of Paying Living Expenses: Not on file  Food Insecurity:   . Worried About Charity fundraiser in the Last Year: Not on file  . Ran Out of Food in the Last Year: Not on file  Transportation Needs: No Transportation Needs  . Lack of Transportation (Medical): No  . Lack of Transportation (Non-Medical): No  Physical Activity:   . Days of Exercise per Week: Not on file  . Minutes of Exercise per Session: Not on file  Stress:   . Feeling of Stress : Not on file  Social Connections:   . Frequency of Communication with Friends and Family: Not on file  . Frequency of Social Gatherings with Friends and Family: Not on file  . Attends Religious Services: Not on file  . Active Member of Clubs or Organizations: Not on file  .  Attends Archivist Meetings: Not on file  . Marital Status: Not on file  Intimate Partner Violence:   . Fear of Current or Ex-Partner: Not on file  . Emotionally Abused: Not on file  . Physically Abused: Not on file  . Sexually Abused: Not on file    FAMILY HISTORY: Family History  Problem Relation Age of Onset  . Heart disease Mother   . Hypertension Mother   . COPD Mother        was a smoker  . Thyroid disease Mother   . Hypertension Father   . Hypertension Sister   . Thyroid disease Sister   . Hypertension Sister   . Thyroid disease Sister   . Breast cancer Maternal Aunt   . Kidney disease Neg Hx   . Bladder Cancer Neg Hx     ALLERGIES:  is allergic to aspirin; other; and penicillins.  MEDICATIONS:  Current Outpatient Medications  Medication Sig Dispense Refill  . Accu-Chek Softclix Lancets lancets TEST UP TO FOUR TIMES DAILY AS DIRECTED 400 each 0  . acetaminophen (TYLENOL) 325 MG tablet Take 325-650 mg by mouth every 6 (six) hours as needed for mild pain. Reported on 10/10/2015    . albuterol (PROVENTIL) (2.5 MG/3ML) 0.083% nebulizer solution Take 3 mLs (2.5 mg total) by nebulization every 6 (six) hours. 75 mL 12  . amLODipine (NORVASC) 10 MG tablet Take 0.5 tablets (5 mg total) by mouth daily. 90 tablet 1  . apixaban (ELIQUIS) 5 MG TABS tablet Take 1 tablet (5 mg total) by mouth 2 (two) times daily. 60 tablet 11  . Blood Glucose Monitoring Suppl (ACCU-CHEK AVIVA PLUS) w/Device KIT USE AS DIRECTED 1 kit 0  . cyanocobalamin 1000 MCG tablet Take 1,000 mcg by mouth daily. Daily in am    . diclofenac sodium (VOLTAREN) 1 % GEL Apply 2 g topically 4 (four) times daily as needed. 50 g 0  . dicyclomine (BENTYL) 20 MG tablet Take 20 mg by mouth every 6 (six) hours.    . digoxin (LANOXIN) 0.125 MG tablet Take 1 tablet by mouth once daily 90 tablet 0  . Dulaglutide ([name]) 6.44 IH/4.7QQ SOPN INJECT 0.75MG SUBCUTANEOUSLY ONCE A WEEK 4 pen 2  . gabapentin (NEURONTIN)  100 MG capsule TAKE 2 CAPSULES BY MOUTH THREE TIMES DAILY 180 capsule 0  . glucose blood (TRUE METRIX BLOOD GLUCOSE TEST) test strip Check blood  glucose twice daily Dx:E11.8, Z79.4 100 each 12  . meclizine (ANTIVERT) 25 MG tablet Take 1 tablet (25 mg total) by mouth 3 (three) times daily as needed for dizziness. 30 tablet 0  . Menthol, Topical Analgesic, (BIOFREEZE EX) Apply 1 application topically as needed (for pain).     . ondansetron (ZOFRAN) 8 MG tablet Take 1 tablet (8 mg total) by mouth every 8 (eight) hours as needed for nausea or vomiting. 30 tablet 2  . OPSUMIT 10 MG tablet TAKE 1 TABLET BY MOUTH EVERY DAY 30 tablet 3  . OXYGEN Inhale 4 L into the lungs continuous.    . potassium chloride SA (K-DUR) 20 MEQ tablet Take 1 tablet (20 mEq total) by mouth daily. 90 tablet 3  . rosuvastatin (CRESTOR) 10 MG tablet Take 1 tablet by mouth once daily 90 tablet 0  . Selexipag 200 MCG TABS Take 2 tablets (400MCG) by mouth TWICE a day    . sildenafil (REVATIO) 20 MG tablet Take 1 tablet (20 mg total) by mouth 3 (three) times daily. 270 tablet 3  . spironolactone (ALDACTONE) 25 MG tablet TAKE 1 TABLET BY MOUTH AT BEDTIME 30 tablet 3  . torsemide (DEMADEX) 20 MG tablet TAKE 3 TABLETS BY MOUTH TWICE DAILY 540 tablet 0  . UPTRAVI 800 MCG TABS Take 1,200 mcg by mouth daily.      No current facility-administered medications for this visit.   Facility-Administered Medications Ordered in Other Visits  Medication Dose Route Frequency Provider Last Rate Last Admin  . 0.9 %  sodium chloride infusion   Intravenous Once Charlaine Dalton R, MD      . cyanocobalamin ((VITAMIN B-12)) injection 1,000 mcg  1,000 mcg Intramuscular Once Charlaine Dalton R, MD      . iron sucrose (VENOFER) injection 200 mg  200 mg Intravenous Once Cammie Sickle, MD          PHYSICAL EXAMINATION:   Vitals:   08/15/19 1357  BP: 122/69  Pulse: (!) 58  Temp: (!) 97 F (36.1 C)   Filed Weights   08/15/19 1357   Weight: 182 lb 9 oz (82.8 kg)    Physical Exam  Constitutional: She is oriented to person, place, and time and well-developed, well-nourished, and in no distress.  Patient in wheelchair.  Accompanied by daughter.  She is on O2 nasal cannula.  HENT:  Head: Normocephalic and atraumatic.  Mouth/Throat: Oropharynx is clear and moist. No oropharyngeal exudate.  Eyes: Pupils are equal, round, and reactive to light.  Cardiovascular: Normal rate and regular rhythm.  Pulmonary/Chest: No respiratory distress. She has no wheezes.  Decreased air entry bilaterally.  Abdominal: Soft. Bowel sounds are normal. She exhibits no distension and no mass. There is no abdominal tenderness. There is no rebound and no guarding.  Musculoskeletal:        General: No tenderness or edema. Normal range of motion.     Cervical back: Normal range of motion and neck supple.  Neurological: She is alert and oriented to person, place, and time.  Skin: Skin is warm.  Psychiatric: Affect normal.    LABORATORY DATA:  I have reviewed the data as listed Lab Results  Component Value Date   WBC 5.5 08/15/2019   HGB 8.3 (L) 08/15/2019   HCT 29.7 (L) 08/15/2019   MCV 85.8 08/15/2019   PLT 210 08/15/2019   Recent Labs    08/20/18 1141 11/19/18 1517 01/20/19 1121 01/20/19 1121 02/03/19 1413 07/08/19 1504 08/01/19 1524  NA   < >  142 137   < > 138 139 137  K   < > 4.5 3.7   < > 4.1 4.4 3.8  CL   < > 99 102   < > 99 101 99  CO2   < > 25 24   < > _0 GLUCOSE   < > 231* 128*   < > 114* 102* 94  BUN   < > 23 13   < > 26* 26* 24*  CREATININE   < > 1.40* 1.28*   < > 1.39* 1.39* 1.16*  CALCIUM   < > 9.1 9.1   < > 9.7 9.4 9.3  GFRNONAA  --  37* 41*  --   --   --  46*  GFRAA  --  42* 47*  --   --   --  53*  PROT  --   --   --   --  7.6 7.4 8.1  ALBUMIN  --   --   --   --  4.1  --  4.0  AST  --   --   --   --  _1 ALT  --   --   --   --  7 5* 10  ALKPHOS  --   --   --   --  111  --  124  BILITOT  --   --    --   --  0.4 0.4 0.5  BILIDIR  --   --   --   --  0.1  --   --    < > = values in this interval not displayed.     No results found.  Iron deficiency #Anemia-iron deficiency-most recent hemoglobin 8.3- quite symptomatic.  Recommend continued IV Venofer.   #Etiology-iron deficient is unclear.  Question CKD versus others.  Colonoscopy March 2020-polyps.  Stool occult-ordered today.  #History of DVT/s/p IVC filter; ?  A. Fib-Eliquis-stable.  #Borderline low B12-230; recommend B12 injections every 3 to 6 months.  # DISPOSITION:  stool occult cards x3 today # venofer/ B12 injectiontoday;  # venofer as planned for next 2 week.  # Follow up in 6 weeks-MD-labs- cbc;possible Venofer- Dr.B  All questions were answered. The patient knows to call the clinic with any problems, questions or concerns.    Cammie Sickle, MD 08/15/2019 3:06 PM

## 2019-08-15 NOTE — Assessment & Plan Note (Addendum)
#  Anemia-iron deficiency-most recent hemoglobin 8.3- quite symptomatic.  Recommend continued IV Venofer.   #Etiology-iron deficient is unclear.  Question CKD versus others.  Colonoscopy March 2020-polyps.  Stool occult-ordered today.  #History of DVT/s/p IVC filter; ?  A. Fib-Eliquis-stable.  #Borderline low B12-230; recommend B12 injections every 3 to 6 months.  # DISPOSITION:  stool occult cards x3 today # venofer/ B12 injectiontoday;  # venofer as planned for next 2 week.  # Follow up in 6 weeks-MD-labs- cbc;possible Venofer- Dr.B

## 2019-08-15 NOTE — Patient Outreach (Signed)
[facility]) Care Management  08/15/2019  Debra Saunders 1946-01-15 [id number]   Unsuccessful call placed to patient regarding patient assistance application(s) for [name] with [employer] , HIPAA compliant voicemail left.   Was calling patient to inquire if she has received the application that we mailed to her on 08/01/2019.  Follow up:  Will follow up with 2nd outreach in 3-7 business days if call is not returned.  Jaterrius Ricketson P. Khizar Fiorella, [place] Management [phone]

## 2019-08-15 NOTE — Patient Outreach (Signed)
[place] [facility]) Care Management  08/15/2019  LATARA MICHELI [birth year] [id number]  ADDENDUM  Incoming call received from patient in regards to voicemail that was left for her about her [employer] application for [name].  Spoke to patient, HIPAA identifiers verified.  Patient informed she does not recall receiving the application that was mailed to her on 08/02/2019. She requested another application will be mailed.   Will mail patient another application when in the office later this week and will follow up with her in 5-7 business days from that date.  Yeiden Frenkel P. Everley Evora, [place] Management [phone]

## 2019-08-16 ENCOUNTER — Telehealth: Payer: Self-pay | Admitting: Family Medicine

## 2019-08-16 DIAGNOSIS — R5381 Other malaise: Secondary | ICD-10-CM | POA: Diagnosis not present

## 2019-08-16 DIAGNOSIS — I5042 Chronic combined systolic (congestive) and diastolic (congestive) heart failure: Secondary | ICD-10-CM | POA: Diagnosis not present

## 2019-08-16 DIAGNOSIS — J449 Chronic obstructive pulmonary disease, unspecified: Secondary | ICD-10-CM | POA: Diagnosis not present

## 2019-08-16 DIAGNOSIS — D649 Anemia, unspecified: Secondary | ICD-10-CM | POA: Diagnosis not present

## 2019-08-16 DIAGNOSIS — R0602 Shortness of breath: Secondary | ICD-10-CM | POA: Diagnosis not present

## 2019-08-16 DIAGNOSIS — R5383 Other fatigue: Secondary | ICD-10-CM | POA: Diagnosis not present

## 2019-08-16 DIAGNOSIS — D509 Iron deficiency anemia, unspecified: Secondary | ICD-10-CM | POA: Diagnosis not present

## 2019-08-16 DIAGNOSIS — I130 Hypertensive heart and chronic kidney disease with heart failure and stage 1 through stage 4 chronic kidney disease, or unspecified chronic kidney disease: Secondary | ICD-10-CM | POA: Diagnosis not present

## 2019-08-16 DIAGNOSIS — E785 Hyperlipidemia, unspecified: Secondary | ICD-10-CM | POA: Diagnosis not present

## 2019-08-16 MED ORDER — GLUCOSE BLOOD VI STRP: ORAL_STRIP | 12 refills | Status: DC

## 2019-08-16 NOTE — Telephone Encounter (Signed)
Patient needs a prescription for testing stripes to go with her new meter. Acue Check meter, [facility].

## 2019-08-17 ENCOUNTER — Ambulatory Visit
Admission: RE | Admit: 2019-08-17 | Discharge: 2019-08-17 | Disposition: A | Discharge status: Home / Self Care | Payer: Self-pay | Source: Ambulatory Visit | Attending: *Deleted | Admitting: *Deleted

## 2019-08-17 ENCOUNTER — Ambulatory Visit
Admission: RE | Admit: 2019-08-17 | Discharge: 2019-08-17 | Disposition: A | Discharge status: Home / Self Care | Payer: Medicare HMO | Source: Ambulatory Visit | Attending: Family Medicine | Admitting: Family Medicine

## 2019-08-17 ENCOUNTER — Other Ambulatory Visit: Payer: Self-pay | Admitting: *Deleted

## 2019-08-17 ENCOUNTER — Other Ambulatory Visit: Payer: Self-pay

## 2019-08-17 DIAGNOSIS — N83201 Unspecified ovarian cyst, right side: Secondary | ICD-10-CM | POA: Insufficient documentation

## 2019-08-17 DIAGNOSIS — D649 Anemia, unspecified: Secondary | ICD-10-CM | POA: Diagnosis not present

## 2019-08-17 DIAGNOSIS — R5383 Other fatigue: Secondary | ICD-10-CM | POA: Diagnosis not present

## 2019-08-17 DIAGNOSIS — N83202 Unspecified ovarian cyst, left side: Secondary | ICD-10-CM | POA: Insufficient documentation

## 2019-08-17 DIAGNOSIS — Z1231 Encounter for screening mammogram for malignant neoplasm of breast: Secondary | ICD-10-CM

## 2019-08-17 DIAGNOSIS — R0782 Intercostal pain: Secondary | ICD-10-CM | POA: Diagnosis not present

## 2019-08-17 DIAGNOSIS — I5042 Chronic combined systolic (congestive) and diastolic (congestive) heart failure: Secondary | ICD-10-CM | POA: Diagnosis not present

## 2019-08-17 DIAGNOSIS — R0602 Shortness of breath: Secondary | ICD-10-CM | POA: Diagnosis not present

## 2019-08-17 DIAGNOSIS — J449 Chronic obstructive pulmonary disease, unspecified: Secondary | ICD-10-CM | POA: Diagnosis not present

## 2019-08-17 DIAGNOSIS — R5381 Other malaise: Secondary | ICD-10-CM | POA: Diagnosis not present

## 2019-08-17 DIAGNOSIS — J849 Interstitial pulmonary disease, unspecified: Secondary | ICD-10-CM | POA: Diagnosis not present

## 2019-08-17 DIAGNOSIS — D509 Iron deficiency anemia, unspecified: Secondary | ICD-10-CM | POA: Diagnosis not present

## 2019-08-17 DIAGNOSIS — I130 Hypertensive heart and chronic kidney disease with heart failure and stage 1 through stage 4 chronic kidney disease, or unspecified chronic kidney disease: Secondary | ICD-10-CM | POA: Diagnosis not present

## 2019-08-17 DIAGNOSIS — E785 Hyperlipidemia, unspecified: Secondary | ICD-10-CM | POA: Diagnosis not present

## 2019-08-18 ENCOUNTER — Telehealth: Payer: Self-pay

## 2019-08-18 DIAGNOSIS — D649 Anemia, unspecified: Secondary | ICD-10-CM | POA: Diagnosis not present

## 2019-08-18 DIAGNOSIS — R5381 Other malaise: Secondary | ICD-10-CM | POA: Diagnosis not present

## 2019-08-18 DIAGNOSIS — D509 Iron deficiency anemia, unspecified: Secondary | ICD-10-CM | POA: Diagnosis not present

## 2019-08-18 DIAGNOSIS — I130 Hypertensive heart and chronic kidney disease with heart failure and stage 1 through stage 4 chronic kidney disease, or unspecified chronic kidney disease: Secondary | ICD-10-CM | POA: Diagnosis not present

## 2019-08-18 DIAGNOSIS — J449 Chronic obstructive pulmonary disease, unspecified: Secondary | ICD-10-CM | POA: Diagnosis not present

## 2019-08-18 DIAGNOSIS — R5383 Other fatigue: Secondary | ICD-10-CM | POA: Diagnosis not present

## 2019-08-18 DIAGNOSIS — R0602 Shortness of breath: Secondary | ICD-10-CM | POA: Diagnosis not present

## 2019-08-18 DIAGNOSIS — E785 Hyperlipidemia, unspecified: Secondary | ICD-10-CM | POA: Diagnosis not present

## 2019-08-18 DIAGNOSIS — I5042 Chronic combined systolic (congestive) and diastolic (congestive) heart failure: Secondary | ICD-10-CM | POA: Diagnosis not present

## 2019-08-19 ENCOUNTER — Other Ambulatory Visit: Payer: Self-pay

## 2019-08-19 ENCOUNTER — Ambulatory Visit: Payer: Medicare HMO

## 2019-08-19 ENCOUNTER — Other Ambulatory Visit (HOSPITAL_COMMUNITY): Payer: Self-pay

## 2019-08-19 DIAGNOSIS — E611 Iron deficiency: Secondary | ICD-10-CM

## 2019-08-19 DIAGNOSIS — E785 Hyperlipidemia, unspecified: Secondary | ICD-10-CM

## 2019-08-19 LAB — OCCULT BLOOD X 1 CARD TO LAB, STOOL
Fecal Occult Bld: NEGATIVE
Fecal Occult Bld: NEGATIVE
Fecal Occult Bld: NEGATIVE

## 2019-08-19 MED ORDER — ROSUVASTATIN CALCIUM 10 MG PO TABS: 10.0000 mg | ORAL_TABLET | Freq: Every day | ORAL | 0 refills | Status: DC

## 2019-08-20 NOTE — Telephone Encounter (Signed)
Error.  Debra Saunders,cma  

## 2019-08-21 ENCOUNTER — Other Ambulatory Visit: Payer: Self-pay | Admitting: Family Medicine

## 2019-08-21 DIAGNOSIS — N83201 Unspecified ovarian cyst, right side: Secondary | ICD-10-CM

## 2019-08-21 DIAGNOSIS — J849 Interstitial pulmonary disease, unspecified: Secondary | ICD-10-CM

## 2019-08-23 ENCOUNTER — Telehealth (HOSPITAL_COMMUNITY): Payer: Self-pay | Admitting: Pharmacist

## 2019-08-23 ENCOUNTER — Telehealth (HOSPITAL_COMMUNITY): Payer: Self-pay | Admitting: *Deleted

## 2019-08-23 NOTE — Telephone Encounter (Signed)
Received message that patient is having difficulty obtaining Uptravi from [place]. [name] was approved by [employer], Effective dates: 06/24/19 through 06/22/20. Spoke with [employer] and it appears that the reason the pharmacy is having an issue is because they are exceeding the quantity limits (60 for 30 days) because patient is actively up-titrating. I called [facility] and asked them to send a prescription for 1200 mcg tablets, 1 tablet twice daily, so that that quantity limits were not exceeded. They are working to expedite the order and will contact the clinic if there are any problems shipping out the new strength.   Audry Riles, PharmD, BCPS, BCCP, CPP Heart Failure Clinic Pharmacist [phone]

## 2019-08-23 NOTE — Telephone Encounter (Signed)
[name] with paramedicine called to report pt will be out of uptravi tomorrow due to insurance coverage for medication. I told [name] the note from our clinic PharmD on 07/21/18 stated uptravi was approved until 06/22/20. [name] said the pharmacy told pt the medication was not approved by insurance. I told [name] I would follow up with our clinic pharmD Lauren and have Lauren call her back.  [name]- [phone]  Routed to [employer]

## 2019-08-24 ENCOUNTER — Telehealth (HOSPITAL_COMMUNITY): Payer: Self-pay | Admitting: Pharmacist

## 2019-08-24 NOTE — Telephone Encounter (Signed)
Spoke with [facility] and they are shipping Ms. Mendibles's Uptravi to her today using the 1200 mcg and 200 mcg tablets to make a total dose of 1400 mcg BID. [employer] did approve a quantity limits exception for the 200 mcg tablets - up to 360 can be dispensed per 30 days (good through 06/22/20). Hopefully this will prevent any future issues with up-titration orders. I informed [name] Journalist, newspaper) personally of the updates.   Audry Riles, PharmD, BCPS, BCCP, CPP Heart Failure Clinic Pharmacist [phone]

## 2019-08-25 ENCOUNTER — Other Ambulatory Visit: Payer: Self-pay | Admitting: Pharmacy Technician

## 2019-08-25 ENCOUNTER — Other Ambulatory Visit (HOSPITAL_COMMUNITY): Payer: Self-pay

## 2019-08-25 ENCOUNTER — Other Ambulatory Visit (HOSPITAL_COMMUNITY): Payer: Self-pay | Admitting: Cardiology

## 2019-08-25 ENCOUNTER — Encounter (HOSPITAL_COMMUNITY): Payer: Self-pay

## 2019-08-25 NOTE — Patient Outreach (Signed)
[facility]) Care Management  08/25/2019  Debra Saunders 02/03/1946 [id number]   Successful call placed to patient regarding patient assistance application(s) for [name] with [employer] , HIPAA identifiers verified.   Patient informed she has received the application and plans to have the application in the mail back to [facility] on Friday 08/26/2019.  Follow up:  Will route note to embedded [facility] RPh [name] for case closure if document(s) have not been received in the next 15 business days.  [name] P. [name], [place]  [phone]

## 2019-08-25 NOTE — Progress Notes (Signed)
Had a home visit with [place] today.  Been working to get [name] shipped to her this past week and they now advised computers are down and they should get it delivered tomorrow.  After contacting [place] with Pharmacy at HF clinic finally got Opsumit and sildinafil refilled and they will be shipping it tomorrow.  Verified her meds.  Refilled her med boxes for 2 weeks.  She has been taking 800 mcg [name] til she gets a new shipment.  She is aware to take 1400 mcg when it arrives.  Called in refills for digoxin.  She states she is still hurting in her side.  Dr [name] has been doing several test to figure it out.  She has not yet received her new test strips. Offered to check her sugar and she refused.  She has been taking her [name].  Her weight is at 180 , she states been staying the same.  She has no more extremity swelling than her normal.  She tries to watch low sodium diet.  She has not eaten today, she is bad for doing that.  Discussed with her eating 3 meals a day, especially when she takes medications.  She denies chest pain, headaches, dizziness or shortness of breath at rest.  Will continue to visit for heart failure, diet and medication compliance.   [place] [phone]

## 2019-08-26 ENCOUNTER — Other Ambulatory Visit: Payer: Self-pay

## 2019-08-26 ENCOUNTER — Inpatient Hospital Stay: Payer: Medicare HMO | Attending: Internal Medicine

## 2019-08-26 ENCOUNTER — Telehealth: Payer: Self-pay | Admitting: Obstetrics & Gynecology

## 2019-08-26 VITALS — BP 108/60 | HR 60 | Temp 96.4°F | Resp 20

## 2019-08-26 DIAGNOSIS — E611 Iron deficiency: Secondary | ICD-10-CM | POA: Diagnosis not present

## 2019-08-26 DIAGNOSIS — E538 Deficiency of other specified B group vitamins: Secondary | ICD-10-CM | POA: Diagnosis not present

## 2019-08-26 MED ORDER — IRON SUCROSE 20 MG/ML IV SOLN
200.0000 mg | Freq: Once | INTRAVENOUS | Status: AC
  Administered 2019-08-26: 14:00:00 200 mg via INTRAVENOUS

## 2019-08-26 MED ORDER — SODIUM CHLORIDE 0.9 % IV SOLN
Freq: Once | INTRAVENOUS | Status: AC
  Filled 2019-08-26: qty 250

## 2019-08-26 NOTE — Telephone Encounter (Signed)
[facility] referring for Bilateral ovarian cysts. Called and left voicemail for patient to call back to be schedule

## 2019-08-30 ENCOUNTER — Ambulatory Visit (INDEPENDENT_AMBULATORY_CARE_PROVIDER_SITE_OTHER): Payer: Medicare HMO | Admitting: *Deleted

## 2019-08-30 DIAGNOSIS — I495 Sick sinus syndrome: Secondary | ICD-10-CM

## 2019-08-30 LAB — CUP PACEART REMOTE DEVICE CHECK
Battery Remaining Longevity: 90 mo
Battery Remaining Percentage: 97 %
Brady Statistic RA Percent Paced: 72 %
Brady Statistic RV Percent Paced: 19 %
Date Time Interrogation Session: 20210309033500
Implantable Lead Implant Date: 20150720
Implantable Lead Implant Date: 20150720
Implantable Lead Location: 753859
Implantable Lead Location: 753860
Implantable Lead Model: 5076
Implantable Lead Model: 5076
Implantable Pulse Generator Implant Date: 20150720
Lead Channel Impedance Value: 360 Ohm
Lead Channel Impedance Value: 480 Ohm
Lead Channel Pacing Threshold Amplitude: 0.7 V
Lead Channel Pacing Threshold Pulse Width: 0.4 ms
Lead Channel Setting Pacing Amplitude: 2 V
Lead Channel Setting Pacing Amplitude: 2.4 V
Lead Channel Setting Pacing Pulse Width: 0.4 ms
Lead Channel Setting Sensing Sensitivity: 2.5 mV
Pulse Gen Serial Number: 390330

## 2019-08-31 ENCOUNTER — Other Ambulatory Visit: Payer: Self-pay

## 2019-08-31 ENCOUNTER — Ambulatory Visit (INDEPENDENT_AMBULATORY_CARE_PROVIDER_SITE_OTHER): Payer: Medicare HMO | Admitting: Obstetrics and Gynecology

## 2019-08-31 ENCOUNTER — Encounter: Payer: Self-pay | Admitting: Obstetrics and Gynecology

## 2019-08-31 VITALS — BP 128/84 | Ht 60.0 in | Wt 180.0 lb

## 2019-08-31 DIAGNOSIS — N83202 Unspecified ovarian cyst, left side: Secondary | ICD-10-CM | POA: Diagnosis not present

## 2019-08-31 DIAGNOSIS — N83201 Unspecified ovarian cyst, right side: Secondary | ICD-10-CM | POA: Diagnosis not present

## 2019-08-31 NOTE — Progress Notes (Signed)
Obstetrics & Gynecology Office Visit   Chief Complaint  Patient presents with  . Ovarian Cyst   The patient is seen in referral at the request of [name], MD from [facility] for bilateral ovarian cysts.   History of Present Illness: 75 y.o. G4P0 female who is seen in referral from [name], MD from [facility] for bilateral ovarian cysts.  She has no symptoms.  The cysts were noted incidentally on CT scan in October.  A pelvic ultrasound was ordered and performed last month.  She states she had cysts noted in 2014-2015 and was told to follow up.  However, no other imaging has been performed since that time.   From ultrasound report: "Left ovary  Measurements: 3.6 x 2.7 x 3.0 cm = volume: 15.4 mL. Hypoechoic nodule within LEFT ovary 2.7 x 2.1 x 2.2 cm likely cyst, either complicated by internal echoes or containing scattered artifacts. No septations or mural nodularity.  Other findings  Cystic structure identified within the RIGHT adnexa 2.4 x 2.0 x 2.3 cm, simple features, question ovarian versus paraovarian cyst. No free pelvic fluid. No additional pelvic masses."  CT Scan 04/13/2020: left ovary: 3.3 x 2.4 cm, right ovarian lesion, paraovarian 2.3 cm.   Past Medical History:  Diagnosis Date  . Arthritis   . Atrial fibrillation, persistent ([place])    a. s/p TEE-guided DCCV on 08/20/2017  . Cardiorenal syndrome    a. 09/2017 Creat rose to 2.7 w/ diuresis-->HD x 2.  . Chest pain    a. 01/2012 Cath: nonobs dzs;  c. 04/2014 MV: No ischemia.  . Chronic back pain   . Chronic combined systolic (congestive) and diastolic (congestive) heart failure ([place])    a. Dx 2005 @ [facility];  b. 02/2014 Echo: Ef 55-60%, no rwma, mild MR, PASP 66mHg. c. 07/2017: EF 30-35%, diffuse HK, trivial AI, severe MR, moderate TR; d. 09/2017 Echo: EF 40-45%, ant, antsep, ap, basal HK; e. 02/2018 Echo: EF 55-60%, Gr1 DD. Ao sclerosis w/o stenosis. Mild to mod MR. Mildly dil LA.  Mildly reduced RV fxn. Mild TR. PASP 35-436mg.  . Marland Kitchenhronic respiratory failure (HCC)    a. on home O2  . COPD (chronic obstructive pulmonary disease) ([facility]   a. Home O2 - 3lpm  . Gastritis   . History of intracranial hemorrhage    a. 6/14 described by neurosurgery as "small frontal posttraumatic SAH " - pt was on xarelto @ the time.  . Hyperlipidemia   . Hypertension   . Lipoma of back   . Lymphedema    a. Chronic LLE edema.  . Marland KitchenICM (nonischemic cardiomyopathy) ([facility]   a. 09/2017 Echo: EF 40-45%; b. 09/2017 Cath: minor, insignificant CAD; c. 02/2018 Echo: EF 55-60%, Gr1 DD.  . Marland Kitchenbesity   . PAH (pulmonary artery hypertension) ([facility] [place]   a. 09/2017 Echo: PASP 6521m; b. 09/2017 RHC: RA 23, RV 84/13, PA 84/29, PCWP 20, CO 3.89, CI 1.89. LVEDP 18.  . Sepsis with metabolic encephalopathy ([facility]CC[place] . Sleep apnea   . Streptococcal bacteremia   . Stroke ([facility]CC[place] . Subarachnoid hemorrhage ([place]/30/2014  . Symptomatic bradycardia    a. s/p BSX PPM.  . Thyroid nodule   . Type II diabetes mellitus ([facility] . Urinary incontinence     Past Surgical History:  Procedure Laterality Date  . ABDOMINAL HYSTERECTOMY  1969  . [facility]  [facility]   [facility]: Only  20% ostial left circumflex.  [name] CARDIOVERSION N/A 10/12/2017   Procedure: CARDIOVERSION;  Surgeon: Minna Merritts, MD;  Location: [facility] ORS;  Service: Cardiovascular;  Laterality: N/A;  . CHOLECYSTECTOMY    . COLONOSCOPY WITH PROPOFOL N/A 09/10/2018   Procedure: COLONOSCOPY WITH PROPOFOL;  Surgeon: Manya Silvas, MD;  Location[facility] END[facility]COPY;  Service: Endoscopy;  Laterality: N/A;  . CYST REMOVAL TRUNK  2002   BACK  . DIALYSIS/PERMA CATHETER INSERTION N/A 10/06/2017   Procedure: DIALYSIS/PERMA CATHETER INSERTION;  Surgeon: Katha Cabal, MD;  Location: [place] CV LAB;  Service: Cardiovascular;  Laterality: N/A;  . INSERT / REPLACE / REMOVE PACEMAKER    . lipoma removal  2014   back  . PACEMAKER  INSERTION  [address] Scientific dual chamber pacemaker implanted by Dr Caryl Comes for symptomatic bradycardia  . PERMANENT PACEMAKER INSERTION N/A 01/09/2014   Procedure: PERMANENT PACEMAKER INSERTION;  Surgeon: Deboraha Sprang, MD;  Location: [facility] C[facility] LAB;  Service: Cardiovascular;  Laterality: N/A;  . RIGHT HEART CATH N/A 04/19/2018   Procedure: RIGHT HEART CATH;  Surgeon: Larey Dresser, MD;  Location: [place] CV LAB;  Service: Cardiovascular;  Laterality: N/A;  . RIGHT/LEFT HEART CATH AND CORONARY ANGIOGRAPHY N/A 10/19/2017   Procedure: RIGHT/LEFT HEART CATH AND CORONARY ANGIOGRAPHY;  Surgeon: Wellington Hampshire, MD;  Location: [place] CV LAB;  Service: Cardiovascular;  Laterality: N/A;  . TEE WITHOUT CARDIOVERSION N/A 08/20/2017   Procedure: TRANSESOPHAGEAL ECHOCARDIOGRAM (TEE) & Direct current cardioversion;  Surgeon: Minna Merritts, MD;  Location: [facility] ORS;  Service: Cardiovascular;  Laterality: N/A;  . TRIGGER FINGER RELEASE      Gynecologic History: No LMP recorded. Patient has had a hysterectomy.  Obstetric History: G4P4, s/p SVD x 4  Family History  Problem Relation Age of Onset  . Heart disease Mother   . Hypertension Mother   . COPD Mother        was a smoker  . Thyroid disease Mother   . Hypertension Father   . Hypertension Sister   . Thyroid disease Sister   . Hypertension Sister   . Thyroid disease Sister   . Breast cancer Maternal Aunt   . Kidney disease Neg Hx   . Bladder Cancer Neg Hx     Social History   Socioeconomic History  . Marital status: Divorced    Spouse name: Not on file  . Number of children: 4  . Years of education: 72  . Highest education level: High school graduate  Occupational History  . Occupation: Retired- factory work    Comment: exposed to Pharmacist, community  Tobacco Use  . Smoking status: Former Smoker    Packs/day: 0.30    Years: 2.00    Pack years: 0.60    Types: Cigarettes    Quit date: 06/23/1990    Years since quitting:  29.2  . Smokeless tobacco: Never Used  Substance and Sexual Activity  . Alcohol use: No  . Drug use: No  . Sexual activity: Not Currently  Other Topics Concern  . Not on file  Social History Narrative   Lives in [place] with daughter. Has 4 children. No pets.      Work - Restaurant manager, fast food, retired      Diet - regular      02/14/2019: reports has Meals On Wheels delivered to her home;       Remote hx of smoking > 30 years; No alcohol-    Social Determinants of  Health   Financial Resource Strain:   . Difficulty of Paying Living Expenses: Not on file  Food Insecurity:   . Worried About Charity fundraiser in the Last Year: Not on file  . Ran Out of Food in the Last Year: Not on file  Transportation Needs: No Transportation Needs  . Lack of Transportation (Medical): No  . Lack of Transportation (Non-Medical): No  Physical Activity:   . Days of Exercise per Week: Not on file  . Minutes of Exercise per Session: Not on file  Stress:   . Feeling of Stress : Not on file  Social Connections:   . Frequency of Communication with Friends and Family: Not on file  . Frequency of Social Gatherings with Friends and Family: Not on file  . Attends Religious Services: Not on file  . Active Member of Clubs or Organizations: Not on file  . Attends Archivist Meetings: Not on file  . Marital Status: Not on file  Intimate Partner Violence:   . Fear of Current or Ex-Partner: Not on file  . Emotionally Abused: Not on file  . Physically Abused: Not on file  . Sexually Abused: Not on file    Allergies  Allergen Reactions  . Aspirin Hives and Other (See Comments)    Pt states that she is unable to take because she had bleeding in her brain  . Other Other (See Comments)    Pt states that she is unable to take blood thinners because she had bleeding in her brain; Blood Thinners-per doctor at [facility]  . Penicillins Hives, Shortness Of Breath, Swelling and Other (See Comments)    Has  patient had a PCN reaction causing immediate rash, facial/tongue/throat swelling, SOB or lightheadedness with hypotension: Yes Has patient had a PCN reaction causing severe rash involving mucus membranes or skin necrosis: No Has patient had a PCN reaction that required hospitalization No Has patient had a PCN reaction occurring within the last 10 years: No If all of the above answers are "NO", then may proceed with Cephalosporin use.    Prior to Admission medications   Medication Sig Start Date End Date Taking? Authorizing Provider  Accu-Chek Softclix Lancets lancets TEST UP TO FOUR TIMES DAILY AS DIRECTED 05/30/19   [name], MD  acetaminophen (TYLENOL) 325 MG tablet Take 325-650 mg by mouth every 6 (six) hours as needed for mild pain. Reported on 10/10/2015    [provider]  albuterol (PROVENTIL) (2.5 MG/3ML) 0.083% nebulizer solution Take 3 mLs (2.5 mg total) by nebulization every 6 (six) hours. 08/28/17   Loletha Grayer, MD  amLODipine (NORVASC) 10 MG tablet Take 0.5 tablets (5 mg total) by mouth daily. 08/08/19   [name], MD  apixaban (ELIQUIS) 5 MG TABS tablet Take 1 tablet (5 mg total) by mouth 2 (two) times daily. 08/30/18   Larey Dresser, MD  Blood Glucose Monitoring Suppl (ACCU-CHEK AVIVA PLUS) w/Device KIT USE AS DIRECTED 11/11/18   [name], MD  cyanocobalamin 1000 MCG tablet Take 1,000 mcg by mouth daily. Daily in am 09/16/18 09/16/19  [provider]  diclofenac sodium (VOLTAREN) 1 % GEL Apply 2 g topically 4 (four) times daily as needed. 02/03/19   Jodelle Green, FNP  dicyclomine (BENTYL) 20 MG tablet Take 20 mg by mouth every 6 (six) hours.    [provider]  digoxin (LANOXIN) 0.125 MG tablet Take 1 tablet by mouth once daily 08/25/19   [name],  Elby Showers, MD  Dulaglutide ([name]) 6.71 IW/5.8KD SOPN INJECT 0.75MG SUBCUTANEOUSLY ONCE A WEEK 02/14/19   [name], MD  gabapentin (NEURONTIN) 100 MG capsule TAKE 2 CAPSULES  BY MOUTH THREE TIMES DAILY 08/11/19   [name], MD  glucose blood (TRUE METRIX BLOOD GLUCOSE TEST) test strip Check blood glucose twice daily Dx:E11.8, Z79.4 09/17/17   [name], MD  glucose blood test strip Use as instructed 08/16/19   [name], MD  meclizine (ANTIVERT) 25 MG tablet Take 1 tablet (25 mg total) by mouth 3 (three) times daily as needed for dizziness. Patient not taking: Reported on 08/25/2019 07/08/18   [name], MD  Menthol, Topical Analgesic, (BIOFREEZE EX) Apply 1 application topically as needed (for pain).     [provider]  ondansetron (ZOFRAN) 8 MG tablet Take 1 tablet (8 mg total) by mouth every 8 (eight) hours as needed for nausea or vomiting. Patient not taking: Reported on 08/25/2019 09/25/14   Jackolyn Confer, MD  OPSUMIT 10 MG tablet TAKE 1 TABLET BY MOUTH EVERY DAY 05/16/19   Larey Dresser, MD  OXYGEN Inhale 4 L into the lungs continuous.    [provider]  potassium chloride SA (K-DUR) 20 MEQ tablet Take 1 tablet (20 mEq total) by mouth daily. 03/16/19   Larey Dresser, MD  rosuvastatin (CRESTOR) 10 MG tablet Take 1 tablet (10 mg total) by mouth daily. 08/19/19   Larey Dresser, MD  Selexipag 200 MCG TABS Take 2 tablets (400MCG) by mouth TWICE a day    [provider]  sildenafil (REVATIO) 20 MG tablet Take 1 tablet (20 mg total) by mouth 3 (three) times daily. 07/11/19   Larey Dresser, MD  spironolactone (ALDACTONE) 25 MG tablet TAKE 1 TABLET BY MOUTH AT BEDTIME 08/09/19   Larey Dresser, MD  torsemide (DEMADEX) 20 MG tablet TAKE 3 TABLETS BY MOUTH TWICE DAILY 08/11/19   Larey Dresser, MD  UPTRAVI 800 MCG TABS Take 1,200 mcg by mouth 2 (two) times daily. 07/13/19   [provider]    Review of Systems  Constitutional: Negative.   HENT: Negative.   Eyes: Negative.   Respiratory: Negative.   Cardiovascular: Negative.   Gastrointestinal: Negative.   Genitourinary: Negative.     Musculoskeletal: Negative.   Skin: Negative.   Neurological: Negative.   Psychiatric/Behavioral: Negative.      Physical Exam BP 128/84   Ht 5' (1.524 m)   Wt 180 lb (81.6 kg)   BMI 35.15 kg/m  No LMP recorded. Patient has had a hysterectomy. Physical Exam Constitutional:      General: She is not in acute distress.    Appearance: Normal appearance.  HENT:     Head: Normocephalic and atraumatic.  Eyes:     General: No scleral icterus.    Conjunctiva/sclera: Conjunctivae normal.  Pulmonary:     Effort: No respiratory distress.     Comments: Wearing nasal cannula Neurological:     General: No focal deficit present.     Mental Status: She is alert and oriented to person, place, and time.     Cranial Nerves: No cranial nerve deficit.  Psychiatric:        Mood and Affect: Mood normal.        Behavior: Behavior normal.        Judgment: Judgment normal.     Assessment: 74 y.o. G52P0 female here for  1. Bilateral ovarian cysts  Plan: Problem List Items Addressed This Visit      Endocrine   Bilateral ovarian cysts - Primary   Relevant Orders   Ovarian Malignancy Risk-ROMA   US PELVIS (TRANSABDOMINAL ONLY)   US PELVIC COMPLETE WITH TRANSVAGINAL     Discussed CT and ultrasound findings.  No concerning findings on ultrasound and cysts appear to be relatively stable.  The patient is a poor surgical candidate and so diagnostic removal in the setting of low suspicion is not indicated.  Will obtain a Roma score today.  If normal, will plan for repeat ultrasound in 3 months at [facility].  If Roma score is abnormal, will refer to gynecologic oncology.  30 minutes spent in face to face discussion with > 50% spent in counseling,management, and coordination of care of her bilateral ovarian cysts.   Prentice Docker, MD 08/31/2019 2:21 PM    CC: [name], MD [address] [place],  [place] [address]

## 2019-08-31 NOTE — Progress Notes (Signed)
PPM Remote  

## 2019-09-01 ENCOUNTER — Telehealth: Payer: Self-pay

## 2019-09-01 LAB — POSTMENOPAUSAL INTERP: HIGH

## 2019-09-01 LAB — OVARIAN MALIGNANCY RISK-ROMA
Cancer Antigen (CA) 125: 14.8 U/mL (ref 0.0–38.1)
HE4: 200 pmol/L — ABNORMAL HIGH (ref 0.0–96.9)
Postmenopausal ROMA: 3.53 — ABNORMAL HIGH
Premenopausal ROMA: 6.85 — ABNORMAL HIGH

## 2019-09-01 LAB — PREMENOPAUSAL INTERP: HIGH

## 2019-09-01 NOTE — Telephone Encounter (Signed)
Incoming call from [name] with [facility] home health.   She reports that patient has an Echo and carotid on 3// and follow up 3/25. Pt has difficulty with transport and would like them on the same day.  Forwarding to scheduling to see if it is possible to have on the same day. I know that Dr. Fletcher Anon is not available on 3/22.   Routing to scheduling to see if it is possible to have all done on the same day.

## 2019-09-02 ENCOUNTER — Telehealth: Payer: Self-pay | Admitting: Cardiovascular Disease

## 2019-09-02 ENCOUNTER — Telehealth: Payer: Self-pay | Admitting: Obstetrics and Gynecology

## 2019-09-02 ENCOUNTER — Inpatient Hospital Stay: Payer: Medicare HMO

## 2019-09-02 ENCOUNTER — Other Ambulatory Visit: Payer: Self-pay

## 2019-09-02 DIAGNOSIS — E611 Iron deficiency: Secondary | ICD-10-CM | POA: Diagnosis not present

## 2019-09-02 DIAGNOSIS — E538 Deficiency of other specified B group vitamins: Secondary | ICD-10-CM | POA: Diagnosis not present

## 2019-09-02 MED ORDER — IRON SUCROSE 20 MG/ML IV SOLN
200.0000 mg | Freq: Once | INTRAVENOUS | Status: AC
  Administered 2019-09-02: 200 mg via INTRAVENOUS
  Filled 2019-09-02: qty 10

## 2019-09-02 MED ORDER — SODIUM CHLORIDE 0.9 % IV SOLN
Freq: Once | INTRAVENOUS | Status: AC
  Filled 2019-09-02: qty 250

## 2019-09-02 NOTE — Telephone Encounter (Signed)
Called and left voicemail for patient to call back to let me know what day of the week and time of day would work for her to be schedule.

## 2019-09-02 NOTE — Telephone Encounter (Signed)
-----   Message from Alexandria Lodge sent at 09/02/2019  1:53 PM EST ----- Regarding: RE: U/S for 3 months Yes, no prior authorization needed. Thank you. ----- Message ----- From: Audree Camel Sent: 09/02/2019   9:03 AM EST To: Alexandria Lodge Subject: RE: U/S for 3 months                           Does this have prior authorization so I may schedule? ----- Message ----- From: Alexandria Lodge Sent: 09/01/2019   7:51 AM EST To: Coralee Pesa Paschal Subject: FW: U/S for 3 months                           Clarise Cruz, please schedule the patient's ultrasound at [facility] in 3 months. Thank you. ----- Message ----- From: Will Bonnet, MD Sent: 08/31/2019   2:17 PM EST To: Alexandria Lodge Subject: U/S for 3 months                               Please see ultrasound order for follow up at [facility] in 3 months.  Let me know, if I didn't put in the order correctly.  Thanks.

## 2019-09-02 NOTE — Telephone Encounter (Signed)
Lmov to reschedule all on same day

## 2019-09-02 NOTE — Telephone Encounter (Signed)
Error

## 2019-09-05 ENCOUNTER — Telehealth: Payer: Self-pay | Admitting: Cardiovascular Disease

## 2019-09-05 NOTE — Telephone Encounter (Signed)
[name] from [employer] paramedics calling to see if we can arrange transportation for patient upcoming appts

## 2019-09-06 ENCOUNTER — Telehealth: Payer: Self-pay | Admitting: Family Medicine

## 2019-09-06 NOTE — Telephone Encounter (Signed)
   KYLIYAH STIRN DOB: 1945/10/23 MRN: [id number]   RIDER WAIVER AND RELEASE OF LIABILITY  For purposes of improving physical access to our facilities, [facility] is pleased to partner with third parties to provide [place] patients or other authorized individuals the option of convenient, on-demand ground transportation services (the Technical bre[employer]") through use of the technology service that enables users to request on-demand ground transportation from independent third-party providers.  By opting to use and accept these [employer], I, the undersigned, hereby agree on behalf of myself, and on behalf of any minor child using the [employer] for whom I am the parent or legal guardian, as follows:  1. Government social research officer provided to me are provided by independent third-party transportation providers who are not [employer] or employees and who are unaffiliated with [employer]. 2. [employer] is neither a transportation carrier nor a common or public carrier. 3. [place] has no control over the quality or safety of the transportation that occurs as a result of the [employer]. 4. [facility] cannot guarantee that any third-party transportation provider will complete any arranged transportation service. 5. [facility] makes no representation, warranty, or guarantee regarding the reliability, timeliness, quality, safety, suitability, or availability of any of the Transport Services or that they will be error free. 6. I fully understand that traveling by vehicle involves risks and dangers of serious bodily injury, including permanent disability, paralysis, and death. I agree, on behalf of myself and on behalf of any minor child using the Transport Services for whom I am the parent or legal guardian, that the entire risk arising out of my use of the [employer] remains solely with me, to the maximum extent permitted under applicable law. 7. The [employer] are provided "as is" and "as available." [place] disclaims all representations and warranties, express, implied or statutory, not expressly set out in these terms, including the implied warranties of merchantability and fitness for a particular purpose. 8. I hereby waive and release [facility], its agents, employees, officers, directors, representatives, insurers, attorneys, assigns, successors, subsidiaries, and affiliates from any and all past, present, or future claims, demands, liabilities, actions, causes of action, or suits of any kind directly or indirectly arising from acceptance and use of the [employer]. 9. I further waive and release [place] and its affiliates from all present and future liability and responsibility for any injury or death to persons or damages to property caused by or related to the use of the [employer]. 10. I have read this Waiver and Release of Liability, and I understand the terms used in it and their legal significance. This Waiver is freely and voluntarily given with the understanding that my right (as well as the right of any minor child for whom I am the parent or legal guardian using the [employer]) to legal recourse against [place] in connection with the [employer] is knowingly surrendered in return for use of these services.   I attest that I read the consent document to [name], gave Ms. Heiler the opportunity to ask questions and answered the questions asked (if any). I affirm that [name] then provided consent for she's participation in this program.     Legrand Pitts

## 2019-09-06 NOTE — Telephone Encounter (Signed)
Patient has been advised that transportation for her [facility] appt has been arranged with [facility] transprotation services, and that they will contact her to confirm pick up and drop off times. Patient also given the telephone number.

## 2019-09-06 NOTE — Telephone Encounter (Signed)
Contacted [employer] and  Spoke with [employer]. Registered the patient for transportation services. They will provide transportation for the patients upcoming appt with our office on 09/12/19, 09/15/19, 09/20/19. Debra Saunders given the time of the appts and made aware that the patient will need to be [place] medical mall.  Called the patient to provide the information. Patient sts that she was asleep and will need to call our office back. She will need to be provided the number for [employer]. [phone].

## 2019-09-09 ENCOUNTER — Other Ambulatory Visit: Payer: Self-pay | Admitting: Family Medicine

## 2019-09-12 ENCOUNTER — Ambulatory Visit (INDEPENDENT_AMBULATORY_CARE_PROVIDER_SITE_OTHER): Payer: Medicare HMO

## 2019-09-12 ENCOUNTER — Other Ambulatory Visit: Payer: Self-pay

## 2019-09-12 DIAGNOSIS — R55 Syncope and collapse: Secondary | ICD-10-CM

## 2019-09-12 DIAGNOSIS — I495 Sick sinus syndrome: Secondary | ICD-10-CM

## 2019-09-12 DIAGNOSIS — R0602 Shortness of breath: Secondary | ICD-10-CM

## 2019-09-13 ENCOUNTER — Other Ambulatory Visit: Payer: Self-pay | Admitting: Obstetrics and Gynecology

## 2019-09-13 ENCOUNTER — Telehealth: Payer: Self-pay | Admitting: Obstetrics and Gynecology

## 2019-09-13 ENCOUNTER — Telehealth: Payer: Self-pay

## 2019-09-13 DIAGNOSIS — N83201 Unspecified ovarian cyst, right side: Secondary | ICD-10-CM

## 2019-09-13 NOTE — Telephone Encounter (Signed)
Discussed ROMA score with patient. Just to be on safe side, will send for referral to [place]. Patient voiced understanding and agreement. All questions answered.

## 2019-09-13 NOTE — Telephone Encounter (Signed)
-----   Message from Arvil Chaco, PA-C sent at 09/13/2019  6:28 AM EDT ----- Carotid study shows right internal carotid with mild 1-39% stenosis and left internal carotid with mild but slightly higher degree of stenosis, as the % of stenosis is at the higher end of the mild 1-39% range. We will continue to monitor and recommend follow-up scan as clinically indicated, or if indicated by new or worsening symptoms.

## 2019-09-13 NOTE — Telephone Encounter (Signed)
Call to patient to review carotid ultrasound results.   No further questions or orders at this time.   Advised pt to call for any further questions or concerns.   Confirmed appt later this week with Dr. Fletcher Anon.

## 2019-09-14 ENCOUNTER — Telehealth: Payer: Self-pay

## 2019-09-14 NOTE — Telephone Encounter (Signed)
Referral received from [name]. Voicemail left with Ms. Colee to return call for scheduling.

## 2019-09-15 ENCOUNTER — Ambulatory Visit: Payer: Medicare HMO | Admitting: Cardiovascular Disease

## 2019-09-15 ENCOUNTER — Telehealth: Payer: Self-pay | Admitting: Cardiovascular Disease

## 2019-09-15 NOTE — Telephone Encounter (Signed)
Patient called in stating Debra Saunders transportation called and cancelled her ride for appointment today. Patient was rescheduled. Transportation was called and Wilford Grist was given the time of the appts and made aware that the patient will need to be dropped off at the medical mall.

## 2019-09-15 NOTE — Telephone Encounter (Signed)
Last filled on 08/11/19 #180 caps with 0 refills, last f/u was on 08/08/19

## 2019-09-15 NOTE — Progress Notes (Deleted)
Cardiology Office Note   Date:  09/15/2019   ID:  Debra Saunders, DOB 1945-12-01, MRN [id number]  PCP:  [name], MD  Cardiologist:   [name], MD   No chief complaint on file.     History of Present Illness: Debra Saunders is a 74 y.o. female who presents for a follow-up visit regarding chronic diastolic heart failure, severe pulmonary hypertension, chronic atypical musculoskeletal chest pain, atrial fibrillation on anticoagulation and previous bradycardia status post dual-chamber pacemaker placement. She has chronic medical conditions that include diabetes mellitus, hypertension and obesity. She also has chronic left leg lymphedema. Previous cardiac catheterization in August of 2013 showed no evidence of obstructive coronary artery disease. She had prior subarachnoid hemorrhage with cardiac pulmonary arrest in July 2014 in the setting of anticoagulation for suspected pulmonary embolism.  She underwent a dual-chamber pacemaker placement by Dr. Caryl Comes in [phone] for symptomatic bradycardia.  She had multiple hospitalizations for heart She is known to have severe pulmonary hypertension. Right heart catheterization 2019 confirmed this with pulmonary vascular resistance of 7.2 Woods units. VQ scan negative for PE. CT of the lungs showed pulmonary fibrosis which was suspected to be due to amiodarone which was discontinued. Subsequent right heart catheterization showed improvement in pulmonary hypertension. Her pulmonary hypertension is managed by Dr. [name]. She had dizziness in January thought to be due to atrial tachycardia. She also has significant underlying anemia. During last visit with Geni Bers, she complained of chest pain and shortness of breath.  She had an echocardiogram recently which showed an EF of 60 to 17%, grade 2 diastolic dysfunction, moderate pulmonary hypertension with peak systolic pressure of 50 mmHg and moderate to severe mitral  regurgitation. Carotid Doppler showed less than 40% stenosis bilaterally worse on the left side.   Past Medical History:  Diagnosis Date  . Arthritis   . Atrial fibrillation, persistent ([place])    a. s/p TEE-guided DCCV on 08/20/2017  . Cardiorenal syndrome    a. 09/2017 Creat rose to 2.7 w/ diuresis-->HD x 2.  . Chest pain    a. 01/2012 Cath: nonobs dzs;  c. 04/2014 MV: No ischemia.  . Chronic back pain   . Chronic combined systolic (congestive) and diastolic (congestive) heart failure ([place])    a. Dx 2005 @ [facility];  b. 02/2014 Echo: Ef 55-60%, no rwma, mild MR, PASP 63mHg. c. 07/2017: EF 30-35%, diffuse HK, trivial AI, severe MR, moderate TR; d. 09/2017 Echo: EF 40-45%, ant, antsep, ap, basal HK; e. 02/2018 Echo: EF 55-60%, Gr1 DD. Ao sclerosis w/o stenosis. Mild to mod MR. Mildly dil LA. Mildly reduced RV fxn. Mild TR. PASP 35-470mg.  . Marland Kitchenhronic respiratory failure (HCC)    a. on home O2  . COPD (chronic obstructive pulmonary disease) ([facility]   a. Home O2 - 3lpm  . Gastritis   . History of intracranial hemorrhage    a. 6/14 described by neurosurgery as "small frontal posttraumatic SAH " - pt was on xarelto @ the time.  . Hyperlipidemia   . Hypertension   . Lipoma of back   . Lymphedema    a. Chronic LLE edema.  . Marland KitchenICM (nonischemic cardiomyopathy) ([facility]   a. 09/2017 Echo: EF 40-45%; b. 09/2017 Cath: minor, insignificant CAD; c. 02/2018 Echo: EF 55-60%, Gr1 DD.  . Marland Kitchenbesity   . PAH (pulmonary artery hypertension) ([facility] [place]   a. 09/2017 Echo: PASP 658m; b. 09/2017 RHC: RA 23, RV 84/13, PA 84/29, PCWP 20, CO 3.89,  CI 1.89. LVEDP 18.  . Sepsis with metabolic encephalopathy ([place])   . Sleep apnea   . Streptococcal bacteremia   . Stroke ([place])   . Subarachnoid hemorrhage ([place]) 01/19/2013  . Symptomatic bradycardia    a. s/p BSX PPM.  . Thyroid nodule   . Type II diabetes mellitus ([place])   . Urinary incontinence     Past Surgical History:  Procedure Laterality Date  . ABDOMINAL HYSTERECTOMY  1969   . CARDIAC CATHETERIZATION  2013   @ [place]: No obstructive CAD: Only 20% ostial left circumflex.  [name] CARDIOVERSION N/A 10/12/2017   Procedure: CARDIOVERSION;  Surgeon: Minna Merritts, MD;  Location: [facility] ORS;  Service: Cardiovascular;  Laterality: N/A;  . CHOLECYSTECTOMY    . COLONOSCOPY WITH PROPOFOL N/A 09/10/2018   Procedure: COLONOSCOPY WITH PROPOFOL;  Surgeon: Manya Silvas, MD;  Location[facility] ENDOSCOPY;  Service: Endoscopy;  Laterality: N/A;  . CYST REMOVAL TRUNK  2002   BACK  . DIALYSIS/PERMA CATHETER INSERTION N/A 10/06/2017   Procedure: DIALYSIS/PERMA CATHETER INSERTION;  Surgeon: Katha Cabal, MD;  Location: [place] CV LAB;  Service: Cardiovascular;  Laterality: N/A;  . INSERT / REPLACE / REMOVE PACEMAKER    . lipoma removal  2014   back  . PACEMAKER INSERTION  [address] [employer] dual chamber pacemaker implanted by Dr Caryl Comes for symptomatic bradycardia  . PERMANENT PACEMAKER INSERTION N/A 01/09/2014   Procedure: PERMANENT PACEMAKER INSERTION;  Surgeon: Deboraha Sprang, MD;  Location[facility] C[facility] LAB;  Service: Cardiovascular;  Laterality: N/A;  . RIGHT HEART CATH N/A 04/19/2018   Procedure: RIGHT HEART CATH;  Surgeon: Larey Dresser, MD;  Location: [place] CV LAB;  Service: Cardiovascular;  Laterality: N/A;  . RIGHT/LEFT HEART CATH AND CORONARY ANGIOGRAPHY N/A 10/19/2017   Procedure: RIGHT/LEFT HEART CATH AND CORONARY ANGIOGRAPHY;  Surgeon: Wellington Hampshire, MD;  Location: [facility] CV LAB;  Service: Cardiovascular;  Laterality: N/A;  . TEE WITHOUT CARDIOVERSION N/A 08/20/2017   Procedure: TRANSESOPHAGEAL ECHOCARDIOGRAM (TEE) & Direct current cardioversion;  Surgeon: Minna Merritts, MD;  Location: [facility] ORS;  Service: Cardiovascular;  Laterality: N/A;  . TRIGGER FINGER RELEASE       Current Outpatient Medications  Medication Sig Dispense Refill  . gabapentin (NEURONTIN) 100 MG capsule TAKE 2 CAPSULES BY MOUTH THREE TIMES DAILY 180 capsule 1  .  Accu-Chek Softclix Lancets lancets TEST UP TO FOUR TIMES DAILY AS DIRECTED 400 each 0  . acetaminophen (TYLENOL) 325 MG tablet Take 325-650 mg by mouth every 6 (six) hours as needed for mild pain. Reported on 10/10/2015    . albuterol (PROVENTIL) (2.5 MG/3ML) 0.083% nebulizer solution Take 3 mLs (2.5 mg total) by nebulization every 6 (six) hours. 75 mL 12  . amLODipine (NORVASC) 10 MG tablet Take 0.5 tablets (5 mg total) by mouth daily. 90 tablet 1  . apixaban (ELIQUIS) 5 MG TABS tablet Take 1 tablet (5 mg total) by mouth 2 (two) times daily. 60 tablet 11  . Blood Glucose Monitoring Suppl (ACCU-CHEK AVIVA PLUS) w/Device KIT USE AS DIRECTED 1 kit 0  . cyanocobalamin 1000 MCG tablet Take 1,000 mcg by mouth daily. Daily in am    . diclofenac sodium (VOLTAREN) 1 % GEL Apply 2 g topically 4 (four) times daily as needed. 50 g 0  . dicyclomine (BENTYL) 20 MG tablet Take 20 mg by mouth every 6 (six) hours.    . digoxin (LANOXIN) 0.125 MG tablet Take 1 tablet by mouth once  daily 90 tablet 3  . Dulaglutide ([name]) 2.02 RK/2.7CW SOPN INJECT 0.75MG SUBCUTANEOUSLY ONCE A WEEK 4 pen 2  . glucose blood (TRUE METRIX BLOOD GLUCOSE TEST) test strip Check blood glucose twice daily Dx:E11.8, Z79.4 100 each 12  . glucose blood test strip Use as instructed 100 each 12  . meclizine (ANTIVERT) 25 MG tablet Take 1 tablet (25 mg total) by mouth 3 (three) times daily as needed for dizziness. (Patient not taking: Reported on 08/25/2019) 30 tablet 0  . Menthol, Topical Analgesic, (BIOFREEZE EX) Apply 1 application topically as needed (for pain).     . ondansetron (ZOFRAN) 8 MG tablet Take 1 tablet (8 mg total) by mouth every 8 (eight) hours as needed for nausea or vomiting. (Patient not taking: Reported on 08/25/2019) 30 tablet 2  . OPSUMIT 10 MG tablet TAKE 1 TABLET BY MOUTH EVERY DAY 30 tablet 3  . OXYGEN Inhale 4 L into the lungs continuous.    . potassium chloride SA (K-DUR) 20 MEQ tablet Take 1 tablet (20 mEq total) by  mouth daily. 90 tablet 3  . rosuvastatin (CRESTOR) 10 MG tablet Take 1 tablet (10 mg total) by mouth daily. 90 tablet 0  . Selexipag 200 MCG TABS Take 2 tablets (400MCG) by mouth TWICE a day    . sildenafil (REVATIO) 20 MG tablet Take 1 tablet (20 mg total) by mouth 3 (three) times daily. 270 tablet 3  . spironolactone (ALDACTONE) 25 MG tablet TAKE 1 TABLET BY MOUTH AT BEDTIME 30 tablet 3  . torsemide (DEMADEX) 20 MG tablet TAKE 3 TABLETS BY MOUTH TWICE DAILY 540 tablet 0  . UPTRAVI 800 MCG TABS Take 1,200 mcg by mouth 2 (two) times daily.     No current facility-administered medications for this visit.    Allergies:   Aspirin, Other, and Penicillins    Social History:  The patient  reports that she quit smoking about 29 years ago. Her smoking use included cigarettes. She has a 0.60 pack-year smoking history. She has never used smokeless tobacco. She reports that she does not drink alcohol or use drugs.   Family History:  The patient's family history includes Breast cancer in her maternal aunt; COPD in her mother; Heart disease in her mother; Hypertension in her father, mother, sister, and sister; Thyroid disease in her mother, sister, and sister.    ROS:  Please see the history of present illness.   Otherwise, review of systems are positive for none.   All other systems are reviewed and negative.    PHYSICAL EXAM: VS:  There were no vitals taken for this visit. , BMI There is no height or weight on file to calculate BMI. GEN: Well nourished, well developed, in no acute distress  HEENT: normal  Neck: no JVD, carotid bruits, or masses Cardiac: RRR; no  rubs, or gallops, 2/ 6 systolic flow murmur in the aortic area. Chronic left leg edema. Respiratory:  clear to auscultation bilaterally, normal work of breathing GI: soft, nontender, nondistended, + BS MS: no deformity or atrophy  Skin: warm and dry, no rash Neuro:  Strength and sensation are intact Psych: euthymic mood, full  affect   EKG:  EKG is ordered today. The ekg ordered today demonstrates AV sequential pacemaker  Recent Labs: 10/22/2018: Magnesium 1.6 02/03/2019: TSH 1.19 08/01/2019: ALT 10; BUN 24; Creatinine, Ser 1.16; Potassium 3.8; Sodium 137 08/15/2019: Hemoglobin 8.3; Platelets 210    Lipid Panel    Component Value Date/Time   CHOL 100  02/03/2019 1413   CHOL 137 02/03/2013 0441   TRIG 121.0 02/03/2019 1413   TRIG 197 02/03/2013 0441   HDL 51.00 02/03/2019 1413   HDL 25 (L) 02/03/2013 0441   CHOLHDL 2 02/03/2019 1413   VLDL 24.2 02/03/2019 1413   VLDL 39 02/03/2013 0441   LDLCALC 25 02/03/2019 1413   LDLCALC 73 02/03/2013 0441      Wt Readings from Last 3 Encounters:  08/31/19 180 lb (81.6 kg)  08/25/19 180 lb (81.6 kg)  08/15/19 182 lb 9 oz (82.8 kg)        ASSESSMENT AND PLAN:  1.  Chronic diastolic heart failure: She appears to be euvolemic on current dose of torsemide.   Continue follow-up in the heart failure clinic.  2.  Severe pulmonary hypertension: Currently on sildenafil.  She was started recently on OPSUMIT but this medication has not been approved yet by her insurance.  3. Essential hypertension: Blood pressure is reasonably controlled on current medications.  4. Status post dual-chamber pacemaker placement: Followed by Dr. Caryl Comes.  5.  Persistent atrial fibrillation: Seems to be in sinus rhythm now off amiodarone which was discontinued due to suspected lung toxicity.   Disposition:   Continue close follow-up in the heart failure clinic and follow-up with [place] in 6 months.  Signed,  [name], MD  09/15/2019 1:15 PM    [facility]

## 2019-09-19 ENCOUNTER — Telehealth: Payer: Self-pay

## 2019-09-19 NOTE — Telephone Encounter (Signed)
Attempted to call x2 to arrange appointment for gyn onc referral. Voicemail not picking up and phone making high pitched noise.

## 2019-09-20 ENCOUNTER — Ambulatory Visit (INDEPENDENT_AMBULATORY_CARE_PROVIDER_SITE_OTHER): Payer: Medicare HMO | Admitting: Internal Medicine

## 2019-09-20 ENCOUNTER — Encounter: Payer: Self-pay | Admitting: Internal Medicine

## 2019-09-20 ENCOUNTER — Telehealth: Payer: Self-pay

## 2019-09-20 ENCOUNTER — Other Ambulatory Visit: Payer: Self-pay

## 2019-09-20 VITALS — BP 118/60 | HR 60 | Ht 60.0 in | Wt 180.2 lb

## 2019-09-20 DIAGNOSIS — I4819 Other persistent atrial fibrillation: Secondary | ICD-10-CM

## 2019-09-20 DIAGNOSIS — I495 Sick sinus syndrome: Secondary | ICD-10-CM | POA: Diagnosis not present

## 2019-09-20 DIAGNOSIS — Z950 Presence of cardiac pacemaker: Secondary | ICD-10-CM | POA: Diagnosis not present

## 2019-09-20 MED ORDER — AMLODIPINE BESYLATE 5 MG PO TABS: 5.0000 mg | ORAL_TABLET | Freq: Every day | ORAL | 3 refills | Status: DC

## 2019-09-20 NOTE — Telephone Encounter (Signed)
Voicemail left with Ms. Morris to return call to arrange appointment with gyn onc per referral.

## 2019-09-20 NOTE — Patient Instructions (Addendum)
Medication Instructions:  - Your physician recommends that you continue on your current medications as directed. Please refer to the Current Medication list given to you today.  *If you need a refill on your cardiac medications before your next appointment, please call your pharmacy*   Lab Work: - none ordered  If you have labs (blood work) drawn today and your tests are completely normal, you will receive your results only by: [name] MyChart Message (if you have MyChart) OR . A paper copy in the mail If you have any lab test that is abnormal or we need to change your treatment, we will call you to review the results.   Testing/Procedures: - none ordered   Follow-Up: At [facility], you and your health needs are our priority.  As part of our continuing mission to provide you with exceptional heart care, we have created designated Provider Care Teams.  These Care Teams include your primary Cardiologist (physician) and Advanced Practice Providers (APPs -  Physician Assistants and Nurse Practitioners) who all work together to provide you with the care you need, when you need it.  We recommend signing up for the patient portal called "MyChart".  Sign up information is provided on this After Visit Summary.  MyChart is used to connect with patients for Virtual Visits (Telemedicine).  Patients are able to view lab/test results, encounter notes, upcoming appointments, etc.  Non-urgent messages can be sent to your provider as well.   To learn more about what you can do with MyChart, go to [email or web address].    Your next appointment:   1 year(s)  The format for your next appointment:   In Person  Provider:   Virl Axe, MD   Other Instructions:  -[place]  Call the [place] at [phone] or visit [email or web address]. This is a free call. Click here for answers to Frequently Asked Questions about the COVID-19 Vaccine. To learn about priority  groups and to find other local vaccination locations, go to [email or web address]. When arriving at [facility], tune your car radio to 70.9 FM (English) or 91.9 FM (Spanish) for additional instructions about traffic flow, parking and on-site safety.  -[place] [facility] If no times are available for online booking, please try our call center at [phone]. [email or web address] (Above website will bring you to [place] and [facility] health dept sign up)  -[place] Website is preferred because they will message you if you are waitlisted and an appt becomes available. [email or web address]  -[facility] [email or web address]

## 2019-09-20 NOTE — Progress Notes (Signed)
    Patient Care Team: Sonnenberg, Eric G, MD as PCP - General (Family Medicine) Arida, Muhammad A, MD as PCP - Cardiology (Cardiology) Tapson, Victor, MD as Referring Physician (Allergy) McQuaid, Douglas B, MD as Referring Physician (Internal Medicine) Travis, Catherine E, RPH as Pharmacist (Pharmacist)   HPI  Debra Saunders is a 74 y.o. female Seen in followup for [employer] pacemaker implanted 7/15 for bradycardia and chronotropic incompetence.   Chronic dyspnea.  Mostly nonambulatory.  Some edema.  Asymmetric left greater than right.  Chronic oxygen.  Pulmonary hypertension has been improved with therapy.  Some palpitations. DATE TEST EF   4/19 [facility]   No obstruc CAD  3/21 Echo  60-65% Pa PRESS 50        Date Cr K Hgb  2/21 1.16 3.8 8.3               Past Medical History:  Diagnosis Date  . Arthritis   . Atrial fibrillation, persistent (HCC)    a. s/p TEE-guided DCCV on 08/20/2017  . Cardiorenal syndrome    a. 09/2017 Creat rose to 2.7 w/ diuresis-->HD x 2.  . Chest pain    a. 01/2012 Cath: nonobs dzs;  c. 04/2014 MV: No ischemia.  . Chronic back pain   . Chronic combined systolic (congestive) and diastolic (congestive) heart failure (HCC)    a. Dx 2005 @ [facility];  b. 02/2014 Echo: Ef 55-60%, no rwma, mild MR, PASP 39mmHg. c. 07/2017: EF 30-35%, diffuse HK, trivial AI, severe MR, moderate TR; d. 09/2017 Echo: EF 40-45%, ant, antsep, ap, basal HK; e. 02/2018 Echo: EF 55-60%, Gr1 DD. Ao sclerosis w/o stenosis. Mild to mod MR. Mildly dil LA. Mildly reduced RV fxn. Mild TR. PASP 35-40mmHg.  . Chronic respiratory failure (HCC)    a. on home O2  . COPD (chronic obstructive pulmonary disease) (HCC)    a. Home O2 - 3lpm  . Gastritis   . History of intracranial hemorrhage    a. 6/14 described by neurosurgery as "small frontal posttraumatic SAH " - pt was on xarelto @ the time.  . Hyperlipidemia   . Hypertension   . Lipoma of back   . Lymphedema    a. Chronic LLE  edema.  . NICM (nonischemic cardiomyopathy) (HCC)    a. 09/2017 Echo: EF 40-45%; b. 09/2017 Cath: minor, insignificant CAD; c. 02/2018 Echo: EF 55-60%, Gr1 DD.  . Obesity   . PAH (pulmonary artery hypertension) (HCC)    a. 09/2017 Echo: PASP 65mmHg; b. 09/2017 RHC: RA 23, RV 84/13, PA 84/29, PCWP 20, CO 3.89, CI 1.89. LVEDP 18.  . Sepsis with metabolic encephalopathy (HCC)   . Sleep apnea   . Streptococcal bacteremia   . Stroke (HCC)   . Subarachnoid hemorrhage (HCC) 01/19/2013  . Symptomatic bradycardia    a. s/p BSX PPM.  . Thyroid nodule   . Type II diabetes mellitus (HCC)   . Urinary incontinence     Past Surgical History:  Procedure Laterality Date  . ABDOMINAL HYSTERECTOMY  1969  . CARDIAC CATHETERIZATION  2013   @ [facility]: No obstructive CAD: Only 20% ostial left circumflex.  . CARDIOVERSION N/A 10/12/2017   Procedure: CARDIOVERSION;  Surgeon: Gollan, Timothy J, MD;  Location: [facility] ORS;  Service: Cardiovascular;  Laterality: N/A;  . CHOLECYSTECTOMY    . COLONOSCOPY WITH PROPOFOL N/A 09/10/2018   Procedure: COLONOSCOPY WITH PROPOFOL;  Surgeon: Elliott, Robert T, MD;  Location: [facility] ENDOSCOPY;  Service: Endoscopy;  Laterality:   N/A;  . CYST REMOVAL TRUNK  2002   BACK  . DIALYSIS/PERMA CATHETER INSERTION N/A 10/06/2017   Procedure: DIALYSIS/PERMA CATHETER INSERTION;  Surgeon: Schnier, Gregory G, MD;  Location: [facility] INVASIVE CV LAB;  Service: Cardiovascular;  Laterality: N/A;  . INSERT / REPLACE / REMOVE PACEMAKER    . lipoma removal  2014   back  . PACEMAKER INSERTION  2015   [employer] dual chamber pacemaker implanted by Dr Klein for symptomatic bradycardia  . PERMANENT PACEMAKER INSERTION N/A 01/09/2014   Procedure: PERMANENT PACEMAKER INSERTION;  Surgeon: Steven C Klein, MD;  Location: [facility] CATH LAB;  Service: Cardiovascular;  Laterality: N/A;  . RIGHT HEART CATH N/A 04/19/2018   Procedure: RIGHT HEART CATH;  Surgeon: McLean, Dalton S, MD;  Location: [facility] INVASIVE CV LAB;   Service: Cardiovascular;  Laterality: N/A;  . RIGHT/LEFT HEART CATH AND CORONARY ANGIOGRAPHY N/A 10/19/2017   Procedure: RIGHT/LEFT HEART CATH AND CORONARY ANGIOGRAPHY;  Surgeon: Arida, Muhammad A, MD;  Location: [facility] INVASIVE CV LAB;  Service: Cardiovascular;  Laterality: N/A;  . TEE WITHOUT CARDIOVERSION N/A 08/20/2017   Procedure: TRANSESOPHAGEAL ECHOCARDIOGRAM (TEE) & Direct current cardioversion;  Surgeon: Gollan, Timothy J, MD;  Location: [facility] ORS;  Service: Cardiovascular;  Laterality: N/A;  . TRIGGER FINGER RELEASE      Current Outpatient Medications  Medication Sig Dispense Refill  . Accu-Chek Softclix Lancets lancets TEST UP TO FOUR TIMES DAILY AS DIRECTED 400 each 0  . acetaminophen (TYLENOL) 325 MG tablet Take 325-650 mg by mouth every 6 (six) hours as needed for mild pain. Reported on 10/10/2015    . albuterol (PROVENTIL) (2.5 MG/3ML) 0.083% nebulizer solution Take 3 mLs (2.5 mg total) by nebulization every 6 (six) hours. 75 mL 12  . amLODipine (NORVASC) 5 MG tablet Take 5 mg by mouth daily.    . apixaban (ELIQUIS) 5 MG TABS tablet Take 1 tablet (5 mg total) by mouth 2 (two) times daily. 60 tablet 11  . Blood Glucose Monitoring Suppl (ACCU-CHEK AVIVA PLUS) w/Device KIT USE AS DIRECTED 1 kit 0  . dicyclomine (BENTYL) 20 MG tablet Take 20 mg by mouth every 6 (six) hours.    . digoxin (LANOXIN) 0.125 MG tablet Take 1 tablet by mouth once daily 90 tablet 3  . Dulaglutide ([name]) 0.75 MG/0.5ML SOPN INJECT 0.75MG SUBCUTANEOUSLY ONCE A WEEK 4 pen 2  . gabapentin (NEURONTIN) 100 MG capsule TAKE 2 CAPSULES BY MOUTH THREE TIMES DAILY 180 capsule 1  . glucose blood (TRUE METRIX BLOOD GLUCOSE TEST) test strip Check blood glucose twice daily Dx:E11.8, Z79.4 100 each 12  . meclizine (ANTIVERT) 25 MG tablet Take 1 tablet (25 mg total) by mouth 3 (three) times daily as needed for dizziness. 30 tablet 0  . Menthol, Topical Analgesic, (BIOFREEZE EX) Apply 1 application topically as needed (for  pain).     . ondansetron (ZOFRAN) 8 MG tablet Take 1 tablet (8 mg total) by mouth every 8 (eight) hours as needed for nausea or vomiting. 30 tablet 2  . OPSUMIT 10 MG tablet TAKE 1 TABLET BY MOUTH EVERY DAY 30 tablet 3  . OXYGEN Inhale 4 L into the lungs continuous.    . potassium chloride SA (K-DUR) 20 MEQ tablet Take 1 tablet (20 mEq total) by mouth daily. 90 tablet 3  . rosuvastatin (CRESTOR) 10 MG tablet Take 1 tablet (10 mg total) by mouth daily. 90 tablet 0  . Selexipag 200 MCG TABS Take 2 tablets (400MCG) by mouth TWICE a day    .   sildenafil (REVATIO) 20 MG tablet Take 1 tablet (20 mg total) by mouth 3 (three) times daily. 270 tablet 3  . spironolactone (ALDACTONE) 25 MG tablet TAKE 1 TABLET BY MOUTH AT BEDTIME 30 tablet 3  . torsemide (DEMADEX) 20 MG tablet TAKE 3 TABLETS BY MOUTH TWICE DAILY 540 tablet 0  . UPTRAVI 800 MCG TABS Take 1,200 mcg by mouth 2 (two) times daily.     No current facility-administered medications for this visit.    Allergies  Allergen Reactions  . Aspirin Hives and Other (See Comments)    Pt states that she is unable to take because she had bleeding in her brain  . Other Other (See Comments)    Pt states that she is unable to take blood thinners because she had bleeding in her brain; Blood Thinners-per doctor at [facility]  . Penicillins Hives, Shortness Of Breath, Swelling and Other (See Comments)    Has patient had a PCN reaction causing immediate rash, facial/tongue/throat swelling, SOB or lightheadedness with hypotension: Yes Has patient had a PCN reaction causing severe rash involving mucus membranes or skin necrosis: No Has patient had a PCN reaction that required hospitalization No Has patient had a PCN reaction occurring within the last 10 years: No If all of the above answers are "NO", then may proceed with Cephalosporin use.    Review of Systems negative except from HPI and PMH  Physical Exam BP 118/60 (BP Location: Right Arm, Patient  Position: Sitting, Cuff Size: Normal)   Pulse 60   Ht 5' (1.524 m)   Wt 180 lb 4 oz (81.8 kg)   SpO2 98%   BMI 35.20 kg/m  Well developed and well nourished in no acute distress HENT normal Neck supple with JVP-flat Clear Device pocket well healed; without hematoma or erythema.  There is no tethering  Regular rate and rhythm, no  gallop 2/6 murmur Abd-soft with active BS No Clubbing cyanosis  L>.R edema Skin-warm and dry A & Oriented  Grossly normal sensory and motor function  ECG AV pacing with prolonged AV delay 300 ms   Assessment and  Plan  Chronotropic incompetence  Sinus bradycardia  Atrial tachycardia non sustained with rates 200 msec (ie nonsustained AFib)  Dyspnea on exertion  Secondary pulmonary hypertension  Pacemaker [employer]     HFpEF  Hypertension  Hx of intracranial hemorrhage  Not on anticoagulation   Anemia    Blood pressure much better controlled.  As noted above, pulmonary pressures are also lower.  Frequent atrial ectopy.  Initially thought it might of been VA conduction with retrograde conduction present in the absence of antegrade conduction.  Some of the ectopy appears to be this; other parts of the.  The simply atrial ectopics.  As she is scantly aware, would not use medications to suppress.  Device was reprogrammed to a more functional AV delay.  200 ms.  Heart rate excursion is blunted on her pacemaker.  We have activated this.  Exertion is extremely limited.  Anemia is worsening  Will forward to her PCP

## 2019-09-21 ENCOUNTER — Telehealth: Payer: Self-pay

## 2019-09-21 NOTE — Telephone Encounter (Signed)
Received call back from Debra Saunders. Offered appointment on 4/7 for bilateral ovarian cysts with elevated ROMA. She also has an appointment on 4/6 with Dr. Nunzio Cory. We could try and coordinate these together. Due to transportation we have arranged for 4/14 at 1300. She is aware of our location.

## 2019-09-23 ENCOUNTER — Ambulatory Visit: Payer: Medicare HMO | Attending: Internal Medicine

## 2019-09-23 ENCOUNTER — Ambulatory Visit
Admission: RE | Admit: 2019-09-23 | Discharge: 2019-09-23 | Disposition: A | Discharge status: Home / Self Care | Payer: Medicare HMO | Source: Ambulatory Visit | Attending: Family Medicine | Admitting: Family Medicine

## 2019-09-23 DIAGNOSIS — Z23 Encounter for immunization: Secondary | ICD-10-CM

## 2019-09-23 DIAGNOSIS — Z1231 Encounter for screening mammogram for malignant neoplasm of breast: Secondary | ICD-10-CM | POA: Diagnosis not present

## 2019-09-23 DIAGNOSIS — R928 Other abnormal and inconclusive findings on diagnostic imaging of breast: Secondary | ICD-10-CM | POA: Insufficient documentation

## 2019-09-23 DIAGNOSIS — Z1239 Encounter for other screening for malignant neoplasm of breast: Secondary | ICD-10-CM

## 2019-09-23 NOTE — Progress Notes (Signed)
   Covid-19 Vaccination Clinic  Name:  RASHMI TALLENT    MRN: [id number] DOB: 07/08/1945  09/23/2019  Ms. Troiani was observed post Covid-19 immunization for 15 minutes without incident. She was provided with Vaccine Information Sheet and instruction to access the V-Safe system.   Ms. Imbert was instructed to call 911 with any severe reactions post vaccine: [name] Difficulty breathing  . Swelling of face and throat  . A fast heartbeat  . A bad rash all over body  . Dizziness and weakness   Immunizations Administered    Name Date Dose VIS Date Route   Pfizer COVID-19 Vaccine 09/23/2019  3:44 PM 0.3 mL 06/03/2019 Intramuscular   Manufacturer: [place]   Lot: [id number]   [place]: [id number]

## 2019-09-26 ENCOUNTER — Other Ambulatory Visit: Payer: Self-pay

## 2019-09-26 ENCOUNTER — Other Ambulatory Visit: Payer: Self-pay | Admitting: Family Medicine

## 2019-09-26 ENCOUNTER — Ambulatory Visit (INDEPENDENT_AMBULATORY_CARE_PROVIDER_SITE_OTHER): Payer: Medicare HMO | Admitting: Pharmacist

## 2019-09-26 DIAGNOSIS — E1122 Type 2 diabetes mellitus with diabetic chronic kidney disease: Secondary | ICD-10-CM | POA: Diagnosis not present

## 2019-09-26 DIAGNOSIS — I10 Essential (primary) hypertension: Secondary | ICD-10-CM | POA: Diagnosis not present

## 2019-09-26 DIAGNOSIS — R2231 Localized swelling, mass and lump, right upper limb: Secondary | ICD-10-CM

## 2019-09-26 DIAGNOSIS — I272 Pulmonary hypertension, unspecified: Secondary | ICD-10-CM

## 2019-09-26 DIAGNOSIS — R928 Other abnormal and inconclusive findings on diagnostic imaging of breast: Secondary | ICD-10-CM

## 2019-09-26 NOTE — Patient Instructions (Signed)
Visit Information  Goals Addressed            This Visit's Progress     Patient Stated   . "I want to stay healthy" (pt-stated)       CARE PLAN ENTRY (see longtitudinal plan of care for additional care plan information)  Current Barriers:  . Diabetes: controlled; most recent A1c 6.2% o Reports dizziness; happens "randomly", even when sitting or laying down. Reports that things are "blacking out". Happens a lot when she tries to cook (sits on her walker to cook), and she thinks the heat from her stove makes it worse; however, will also happen when she is resting. Feels it "from the neck up".  o Reports this happens a few times a week, but has been going on for a few months.  o Has not been able to check HR or BP during these episodes.  o Worries about falling o Requests refill on meclizine to treat this o Also has been utilizing [employer] benefit for multiple appointments, but notes some episodes of transportation not showing up on time. Is aware of [employer] benefit, but states she only has 3 trips per year . Current antihyperglycemic regimen: [name] 2.50 mg once weekly (metformin recently d/c, diarrhea minimally improved) o Receiving [employer] patient assistance for [name]. Due to reapply for 2021. Notes she has ~2 month supply remaining. Notes she mailed her portion of application back ~2 weeks ago, but doesn't remember the exact date . Current blood glucose readings:  o Fastings: 100-110s o Occasional evening readings: 124, 129, 131 . Cardiovascular risk reduction- followed extensively by heart failure clinic/paramedicine program; Dr. Caryl Comes EP, Dr. Fletcher Anon primary cardiology; o Regular pill box fills from Paramedicine program.  o Current hypertensive/CHF regimen: amlodipine 5 mg daily, digoxin 0.125 mg daily, Opsumit 10 mg daily, Uptravi 1600 mcg BID, Revatio 20 mg TID - BP 128/67 HR 58; SBP lowest 104, highest 136 o Current hyperlipidemia regimen: rosuvastatin 10 mg daily;  LDL at goal <70 on last check . Bilateral ovarian cysts: saw [name]; to see gyn oncology d/t elevated ROMA score . Anemia: [name], f/u and infusion tomorrow  Pharmacist Clinical Goal(s):  [name] Over the next 90 days, patient with work with PharmD and primary care provider to address optimized medication management  Interventions: . Will collaborate w/ CPhT to see if she has received mailed application pages yet for [name] application. If she has not received, will discuss mailing back to patient, and coordinating w/ upcoming provider visits to see if someone can fax her pages of the application to [place], as she has ~1 month of therapy remaining . Glucose well controlled w/o metformin. Agree w/ discontinuation. Encouraged continued BG monitoring . Will review above symptoms w/ Dr. [name]. May be related to cardiac/pulmonary HTN medications, would be ideal to have orthostatic vitals, but patient lives alone and has not checked her readings during these episodes. Patient has cardiology f/u this Friday. Encouraged patient to discuss these episodes w/ Sharolyn Douglas at appt. . D/t risk of falls, will place [facility] RN CM referral for home safety support and evaluation. Unsure if patient would benefit for "life alert" type system in case of falls. Patient notes she does have a bath chair and a rollator walker in her home.  . Will place Care Guide referral for help navigating organizing transportation benefits, as well as potentially put on [facility] wait list for support w/ cooking/cleaning  Patient Self Care Activities:  . Patient will check blood glucose  BID, document, and provide at future appointments . Patient will focus on medication adherence by using pill boxes filled by paramedicine . Patient will report any questions or concerns to provider   Please see past updates related to this goal by clicking on the "Past Updates" button in the selected goal         Patient verbalizes understanding of  instructions provided today.   Plan:  - Will collaborate w/ interdiscplinary team as above - Scheduled f/u call 11/14/19  [name], PharmD, BCACP, [place] Pharmacist [place] [phone]

## 2019-09-26 NOTE — Progress Notes (Deleted)
Cardiology Office Note    Date:  09/26/2019   ID:  Debra Saunders, DOB [birth year], MRN [id number]  PCP:  [name], MD  Cardiologist:  [name], MD  Electrophysiologist:  Virl Axe, MD   Chief Complaint: Follow up  History of Present Illness:   Debra Saunders is a 74 y.o. female with history of ***  ***   Labs independently reviewed: ***  Past Medical History:  Diagnosis Date  . Arthritis   . Atrial fibrillation, persistent (Debra Saunders)    a. s/p TEE-guided DCCV on 08/20/2017  . Cardiorenal syndrome    a. 09/2017 Creat rose to 2.7 w/ diuresis-->HD x 2.  . Chest pain    a. 01/2012 Cath: nonobs dzs;  c. 04/2014 MV: No ischemia.  . Chronic back pain   . Chronic combined systolic (congestive) and diastolic (congestive) heart failure (Debra Saunders)    a. Dx 2005 @ [facility];  b. 02/2014 Echo: Ef 55-60%, no rwma, mild MR, PASP 83mmHg. c. 07/2017: EF 30-35%, diffuse HK, trivial AI, severe MR, moderate TR; d. 09/2017 Echo: EF 40-45%, ant, antsep, ap, basal HK; e. 02/2018 Echo: EF 55-60%, Gr1 DD. Ao sclerosis w/o stenosis. Mild to mod MR. Mildly dil LA. Mildly reduced RV fxn. Mild TR. PASP 35-38mmHg.  Debra Saunders Chronic respiratory failure (HCC)    a. on home O2  . COPD (chronic obstructive pulmonary disease) ([place])    a. Home O2 - 3lpm  . Gastritis   . History of intracranial hemorrhage    a. 6/14 described by neurosurgery as "small frontal posttraumatic SAH " - pt was on xarelto @ the time.  . Hyperlipidemia   . Hypertension   . Lipoma of back   . Lymphedema    a. Chronic LLE edema.  Debra Saunders NICM (nonischemic cardiomyopathy) ([name])    a. 09/2017 Echo: EF 40-45%; b. 09/2017 Cath: minor, insignificant CAD; c. 02/2018 Echo: EF 55-60%, Gr1 DD.  Debra Saunders Obesity   . PAH (pulmonary artery hypertension) (Saunders)    a. 09/2017 Echo: PASP 57mmHg; b. 09/2017 RHC: RA 23, RV 84/13, PA 84/29, PCWP 20, CO 3.89, CI 1.89. LVEDP 18.  . Sepsis with metabolic encephalopathy (Saunders)   . Sleep apnea   . Streptococcal bacteremia     . Stroke ([place])   . Subarachnoid hemorrhage (Debra Saunders) 01/19/2013  . Symptomatic bradycardia    a. s/p BSX PPM.  . Thyroid nodule   . Type II diabetes mellitus (Debra Saunders)   . Urinary incontinence     Past Surgical History:  Procedure Laterality Date  . ABDOMINAL HYSTERECTOMY  1969  . CARDIAC CATHETERIZATION  2013   @ Debra Saunders: No obstructive CAD: Only 20% ostial left circumflex.  Debra Saunders CARDIOVERSION N/A 10/12/2017   Procedure: CARDIOVERSION;  Surgeon: Debra Merritts, MD;  Location: [facility] ORS;  Service: Cardiovascular;  Laterality: N/A;  . CHOLECYSTECTOMY    . COLONOSCOPY WITH PROPOFOL N/A 09/10/2018   Procedure: COLONOSCOPY WITH PROPOFOL;  Surgeon: Debra Silvas, MD;  Location: [facility] ENDOSCOPY;  Service: Endoscopy;  Laterality: N/A;  . CYST REMOVAL TRUNK  2002   BACK  . DIALYSIS/PERMA CATHETER INSERTION N/A 10/06/2017   Procedure: DIALYSIS/PERMA CATHETER INSERTION;  Surgeon: Debra Cabal, MD;  Location: [place] CV LAB;  Service: Cardiovascular;  Laterality: N/A;  . INSERT / REPLACE / REMOVE PACEMAKER    . lipoma removal  2014   back  . PACEMAKER INSERTION  [address] [employer] dual chamber pacemaker implanted by Dr Debra Saunders for symptomatic  bradycardia  . PERMANENT PACEMAKER INSERTION N/A 01/09/2014   Procedure: PERMANENT PACEMAKER INSERTION;  Surgeon: Debra Sprang, MD;  Location: [facility] CATH LAB;  Service: Cardiovascular;  Laterality: N/A;  . RIGHT HEART CATH N/A 04/19/2018   Procedure: RIGHT HEART CATH;  Surgeon: Debra Dresser, MD;  Location: [place] CV LAB;  Service: Cardiovascular;  Laterality: N/A;  . RIGHT/LEFT HEART CATH AND CORONARY ANGIOGRAPHY N/A 10/19/2017   Procedure: RIGHT/LEFT HEART CATH AND CORONARY ANGIOGRAPHY;  Surgeon: Debra Hampshire, MD;  Location: [place] CV LAB;  Service: Cardiovascular;  Laterality: N/A;  . TEE WITHOUT CARDIOVERSION N/A 08/20/2017   Procedure: TRANSESOPHAGEAL ECHOCARDIOGRAM (TEE) & Direct current cardioversion;  Surgeon: Debra Merritts, MD;  Location: [facility] ORS;  Service: Cardiovascular;  Laterality: N/A;  . TRIGGER FINGER RELEASE      Current Medications: No outpatient medications have been marked as taking for the 09/29/19 encounter (Appointment) with Debra Mu, PA-C.    Allergies:   Aspirin, Other, and Penicillins   Social History   Socioeconomic History  . Marital status: Divorced    Spouse name: Not on file  . Number of children: 4  . Years of education: 30  . Highest education level: High school graduate  Occupational History  . Occupation: Retired- factory work    Comment: exposed to Pharmacist, community  Tobacco Use  . Smoking status: Former Smoker    Packs/day: 0.30    Years: 2.00    Pack years: 0.60    Types: Cigarettes    Quit date: 06/23/1990    Years since quitting: 29.2  . Smokeless tobacco: Never Used  Substance and Sexual Activity  . Alcohol use: No  . Drug use: No  . Sexual activity: Not Currently  Other Topics Concern  . Not on file  Social History Narrative   Lives in [place] with daughter. Has 4 children. No pets.      Work - Restaurant manager, fast food, retired      Diet - regular      02/14/2019: reports has Meals On Wheels delivered to her home;       Remote hx of smoking > 30 years; No alcohol-    Social Determinants of Health   Financial Resource Strain:   . Difficulty of Paying Living Expenses:   Food Insecurity:   . Worried About Charity fundraiser in the Last Year:   . Arboriculturist in the Last Year:   Transportation Needs: No Transportation Needs  . Lack of Transportation (Medical): No  . Lack of Transportation (Non-Medical): No  Physical Activity:   . Days of Exercise per Week:   . Minutes of Exercise per Session:   Stress:   . Feeling of Stress :   Social Connections:   . Frequency of Communication with Friends and Family:   . Frequency of Social Gatherings with Friends and Family:   . Attends Religious Services:   . Active Member of Clubs or Organizations:   . Attends  Archivist Meetings:   Debra Saunders Marital Status:      Family History:  The patient's family history includes Breast cancer in her maternal aunt and sister; COPD in her mother; Heart disease in her mother; Hypertension in her father, mother, sister, and sister; Thyroid disease in her mother, sister, and sister. There is no history of Kidney disease or Bladder Cancer.  ROS:   ROS   EKGs/Labs/Other Studies Reviewed:    Studies reviewed were summarized above.  The additional studies were reviewed today:  2D echo 09/12/2019: 1. Left ventricular ejection fraction, by estimation, is 60 to 65%. The  left ventricle has normal function. The left ventricle has no regional  wall motion abnormalities. Left ventricular diastolic parameters are  consistent with Grade II diastolic  dysfunction (pseudonormalization).  2. Right ventricular systolic function is normal. The right ventricular  size is normal. There is moderately elevated pulmonary artery systolic  pressure. The estimated right ventricular systolic pressure is 16.0 mmHg.  3. Left atrial size was moderately dilated.  4. The mitral valve is normal in structure. Moderate to severe mitral  valve regurgitation. No evidence of mitral stenosis.  5. Tricuspid valve regurgitation is mild to moderate.  6. The aortic valve is normal in structure. Aortic valve regurgitation is  not visualized. Mild to moderate aortic valve sclerosis/calcification is  present, without any evidence of aortic stenosis.  7. Pulmonic valve regurgitation is moderate.  8. The inferior vena cava is normal in size with <50% respiratory  variability, suggesting right atrial pressure of 8 mmHg.  __________  RHC 03/2018:  Right Heart Pressures RHC Procedural Findings: Hemodynamics (mmHg) RA mean 6 RV 57/11 PA 56/17, mean 33 PCWP mean 12  Oxygen saturations: PA 65% AO 99%  Cardiac Output (Fick) 6.26  Cardiac Index (Fick) 3.23 PVR 3.4 WU   1. Filling  pressures optimized.  2. Preserved cardiac output.  3. Moderate pulmonary hypertension.  PVR is significantly lower than the last cath, 3.4 WU.  __________  [facility] 09/2017: 1.  Minor irregularities with no significant coronary artery disease. 2.  Left ventricular angiography was not performed when recent acute renal failure.  EF was mildly reduced by echo. 3.  Right heart catheterization showed mildly elevated filling pressures, severe pulmonary hypertension and severely reduced cardiac output. RA pressure: 23 mmHg, RV pressure: 84 over 13 mmHg, PA pressure: 84/29 mmHg pulmonary capillary wedge pressure is 20 mmHg, LVEDP was 18 mmHg, cardiac output: 3.89 with a cardiac index of 1.89.  Recommendations: The patient has severe pulmonary hypertension that seems to be out of proportion to left heart failure.  I resumed small dose torsemide 20 mg once daily. Resume Eliquis 5 mg twice daily tomorrow if no bleeding complications. The patient will need to be referred to advanced heart failure clinic in [facility] after hospital discharge to manage severe pulmonary hypertension.   EKG:  EKG is ordered today.  The EKG ordered today demonstrates ***  Recent Labs: 10/22/2018: Magnesium 1.6 02/03/2019: TSH 1.19 08/01/2019: ALT 10; BUN 24; Creatinine, Ser 1.16; Potassium 3.8; Sodium 137 08/15/2019: Hemoglobin 8.3; Platelets 210  Recent Lipid Panel    Component Value Date/Time   CHOL 100 02/03/2019 1413   CHOL 137 02/03/2013 0441   TRIG 121.0 02/03/2019 1413   TRIG 197 02/03/2013 0441   HDL 51.00 02/03/2019 1413   HDL 25 (L) 02/03/2013 0441   CHOLHDL 2 02/03/2019 1413   VLDL 24.2 02/03/2019 1413   VLDL 39 02/03/2013 0441   LDLCALC 25 02/03/2019 1413   LDLCALC 73 02/03/2013 0441    PHYSICAL EXAM:    VS:  There were no vitals taken for this visit.  BMI: There is no height or weight on file to calculate BMI.  Physical Exam  Wt Readings from Last 3 Encounters:  09/20/19 180 lb 4 oz (81.8 kg)    08/31/19 180 lb (81.6 kg)  08/25/19 180 lb (81.6 kg)     ASSESSMENT & PLAN:   1. ***  Disposition:  F/u with Dr. Fletcher Anon or an APP in ***, and EP as directed.   Medication Adjustments/Labs and Tests Ordered: Current medicines are reviewed at length with the patient today.  Concerns regarding medicines are outlined above. Medication changes, Labs and Tests ordered today are summarized above and listed in the Patient Instructions accessible in Encounters.   Signed, Christell Faith, PA-C 09/26/2019 7:53 AM     [place] [address] [place], [place] [address] [phone]

## 2019-09-26 NOTE — Chronic Care Management (AMB) (Signed)
Chronic Care Management   Follow Up Note   09/26/2019 Name: Debra Saunders MRN: [id number] DOB: 1946-05-05  Referred by: [name], MD Reason for referral : Chronic Care Management (Medication Management)   Debra Saunders is a 74 y.o. year old female who is a primary care patient of [name], [name], MD. The CCM team was consulted for assistance with chronic disease management and care coordination needs.    Contacted patient for medication management review.   Review of patient status, including review of consultants reports, relevant laboratory and other test results, and collaboration with appropriate care team members and the patient's provider was performed as part of comprehensive patient evaluation and provision of chronic care management services.    SDOH (Social Determinants of Health) assessments performed: No See Care Plan activities for detailed interventions related to [facility])     Outpatient Encounter Medications as of 09/26/2019  Medication Sig Note  . amLODipine (NORVASC) 5 MG tablet Take 1 tablet (5 mg total) by mouth daily.   [name] apixaban (ELIQUIS) 5 MG TABS tablet Take 1 tablet (5 mg total) by mouth 2 (two) times daily.   . digoxin (LANOXIN) 0.125 MG tablet Take 1 tablet by mouth once daily   . Dulaglutide ([name]) 8.24 MP/5.3IR SOPN INJECT 0.75MG SUBCUTANEOUSLY ONCE A WEEK   . gabapentin (NEURONTIN) 100 MG capsule TAKE 2 CAPSULES BY MOUTH THREE TIMES DAILY   . OPSUMIT 10 MG tablet TAKE 1 TABLET BY MOUTH EVERY DAY   . potassium chloride SA (K-DUR) 20 MEQ tablet Take 1 tablet (20 mEq total) by mouth daily.   . rosuvastatin (CRESTOR) 10 MG tablet Take 1 tablet (10 mg total) by mouth daily.   . Selexipag 200 MCG TABS Take 2 tablets (400MCG) by mouth TWICE a day   . spironolactone (ALDACTONE) 25 MG tablet TAKE 1 TABLET BY MOUTH AT BEDTIME   . torsemide (DEMADEX) 20 MG tablet TAKE 3 TABLETS BY MOUTH TWICE DAILY   . UPTRAVI 800 MCG TABS Take 1,200 mcg by mouth  2 (two) times daily. 09/26/2019: Total of 1600 mcg BID  . Accu-Chek Softclix Lancets lancets TEST UP TO FOUR TIMES DAILY AS DIRECTED   . acetaminophen (TYLENOL) 325 MG tablet Take 325-650 mg by mouth every 6 (six) hours as needed for mild pain. Reported on 10/10/2015   . albuterol (PROVENTIL) (2.5 MG/3ML) 0.083% nebulizer solution Take 3 mLs (2.5 mg total) by nebulization every 6 (six) hours.   . Blood Glucose Monitoring Suppl (ACCU-CHEK AVIVA PLUS) w/Device KIT USE AS DIRECTED   . dicyclomine (BENTYL) 20 MG tablet Take 20 mg by mouth every 6 (six) hours.   [name] glucose blood (TRUE METRIX BLOOD GLUCOSE TEST) test strip Check blood glucose twice daily Dx:E11.8, Z79.4   . meclizine (ANTIVERT) 25 MG tablet Take 1 tablet (25 mg total) by mouth 3 (three) times daily as needed for dizziness.   . Menthol, Topical Analgesic, (BIOFREEZE EX) Apply 1 application topically as needed (for pain).    . ondansetron (ZOFRAN) 8 MG tablet Take 1 tablet (8 mg total) by mouth every 8 (eight) hours as needed for nausea or vomiting.   . OXYGEN Inhale 4 L into the lungs continuous.   . sildenafil (REVATIO) 20 MG tablet Take 1 tablet (20 mg total) by mouth 3 (three) times daily.    No facility-administered encounter medications on file as of 09/26/2019.     Objective:   Goals Addressed  This Visit's Progress     Patient Stated   . "I want to stay healthy" (pt-stated)       CARE PLAN ENTRY (see longtitudinal plan of care for additional care plan information)  Current Barriers:  . Diabetes: controlled; most recent A1c 6.2% o Reports dizziness; happens "randomly", even when sitting or laying down. Reports that things are "blacking out". Happens a lot when she tries to cook (sits on her walker to cook), and she thinks the heat from her stove makes it worse; however, will also happen when she is resting. Feels it "from the neck up".  o Reports this happens a few times a week, but has been going on for a few  months.  o Has not been able to check HR or BP during these episodes.  o Worries about falling o Requests refill on meclizine to treat this o Also has been utilizing [employer] benefit for multiple appointments, but notes some episodes of transportation not showing up on time. Is aware of [employer] benefit, but states she only has 3 trips per year . Current antihyperglycemic regimen: [name] 2.80 mg once weekly (metformin recently d/c, diarrhea minimally improved) o Receiving [employer] patient assistance for [name]. Due to reapply for 2021. Notes she has ~2 month supply remaining. Notes she mailed her portion of application back ~2 weeks ago, but doesn't remember the exact date . Current blood glucose readings:  o Fastings: 100-110s o Occasional evening readings: 124, 129, 131 . Cardiovascular risk reduction- followed extensively by heart failure clinic/paramedicine program; Dr. Caryl Comes EP, Dr. Fletcher Anon primary cardiology; o Regular pill box fills from Paramedicine program.  o Current hypertensive/CHF regimen: amlodipine 5 mg daily, digoxin 0.125 mg daily, Opsumit 10 mg daily, Uptravi 1600 mcg BID, Revatio 20 mg TID - BP 128/67 HR 58; SBP lowest 104, highest 136 o Current hyperlipidemia regimen: rosuvastatin 10 mg daily; LDL at goal <70 on last check . Bilateral ovarian cysts: saw [name]; to see gyn oncology d/t elevated ROMA score . Anemia: [name], f/u and infusion tomorrow  Pharmacist Clinical Goal(s):  [name] Over the next 90 days, patient with work with PharmD and primary care provider to address optimized medication management  Interventions: . Will collaborate w/ CPhT to see if she has received mailed application pages yet for [name] application. If she has not received, will discuss mailing back to patient, and coordinating w/ upcoming provider visits to see if someone can fax her pages of the application to [place], as she has ~1 month of therapy remaining . Glucose well  controlled w/o metformin. Agree w/ discontinuation. Encouraged continued BG monitoring . Will review above symptoms w/ Dr. [name]. May be related to cardiac/pulmonary HTN medications, would be ideal to have orthostatic vitals, but patient lives alone and has not checked her readings during these episodes. However, patient reports that these episodes also sometimes happen when lying still. Patient has cardiology f/u this Friday. Encouraged patient to discuss these episodes w/ Sharolyn Douglas at appt. Will pass patient's request for meclizine refill to PCP. . D/t risk of falls, will place [facility] RN CM referral for home safety support and evaluation. Unsure if patient would benefit for "life alert" type system in case of falls. Patient notes she does have a bath chair and a rollator walker in her home.  . Will place Care Guide referral for help navigating organizing transportation benefits, as well as potentially put on [facility] wait list for support w/ cooking/cleaning  Patient Self Care Activities:  .  Patient will check blood glucose BID, document, and provide at future appointments . Patient will focus on medication adherence by using pill boxes filled by paramedicine . Patient will report any questions or concerns to provider   Please see past updates related to this goal by clicking on the "Past Updates" button in the selected goal          Plan:  - Will collaborate w/ interdiscplinary team as above - Scheduled f/u call 11/14/19  [name], PharmD, BCACP, [place] Pharmacist [place] [phone]

## 2019-09-27 ENCOUNTER — Inpatient Hospital Stay: Payer: Medicare HMO

## 2019-09-27 ENCOUNTER — Other Ambulatory Visit: Payer: Self-pay

## 2019-09-27 ENCOUNTER — Inpatient Hospital Stay (HOSPITAL_BASED_OUTPATIENT_CLINIC_OR_DEPARTMENT_OTHER): Payer: Medicare HMO | Admitting: Internal Medicine

## 2019-09-27 ENCOUNTER — Inpatient Hospital Stay: Payer: Medicare HMO | Attending: Internal Medicine

## 2019-09-27 DIAGNOSIS — Z90710 Acquired absence of both cervix and uterus: Secondary | ICD-10-CM | POA: Diagnosis not present

## 2019-09-27 DIAGNOSIS — N83202 Unspecified ovarian cyst, left side: Secondary | ICD-10-CM | POA: Diagnosis not present

## 2019-09-27 DIAGNOSIS — N183 Chronic kidney disease, stage 3 unspecified: Secondary | ICD-10-CM | POA: Insufficient documentation

## 2019-09-27 DIAGNOSIS — Z9889 Other specified postprocedural states: Secondary | ICD-10-CM | POA: Insufficient documentation

## 2019-09-27 DIAGNOSIS — D509 Iron deficiency anemia, unspecified: Secondary | ICD-10-CM | POA: Insufficient documentation

## 2019-09-27 DIAGNOSIS — Z803 Family history of malignant neoplasm of breast: Secondary | ICD-10-CM | POA: Insufficient documentation

## 2019-09-27 DIAGNOSIS — N83201 Unspecified ovarian cyst, right side: Secondary | ICD-10-CM | POA: Insufficient documentation

## 2019-09-27 DIAGNOSIS — I130 Hypertensive heart and chronic kidney disease with heart failure and stage 1 through stage 4 chronic kidney disease, or unspecified chronic kidney disease: Secondary | ICD-10-CM | POA: Diagnosis not present

## 2019-09-27 DIAGNOSIS — Z86718 Personal history of other venous thrombosis and embolism: Secondary | ICD-10-CM | POA: Diagnosis not present

## 2019-09-27 DIAGNOSIS — I4891 Unspecified atrial fibrillation: Secondary | ICD-10-CM | POA: Diagnosis not present

## 2019-09-27 DIAGNOSIS — N9089 Other specified noninflammatory disorders of vulva and perineum: Secondary | ICD-10-CM | POA: Insufficient documentation

## 2019-09-27 DIAGNOSIS — Z8673 Personal history of transient ischemic attack (TIA), and cerebral infarction without residual deficits: Secondary | ICD-10-CM | POA: Insufficient documentation

## 2019-09-27 DIAGNOSIS — E538 Deficiency of other specified B group vitamins: Secondary | ICD-10-CM

## 2019-09-27 DIAGNOSIS — N390 Urinary tract infection, site not specified: Secondary | ICD-10-CM | POA: Insufficient documentation

## 2019-09-27 DIAGNOSIS — Z86010 Personal history of colonic polyps: Secondary | ICD-10-CM | POA: Insufficient documentation

## 2019-09-27 DIAGNOSIS — E611 Iron deficiency: Secondary | ICD-10-CM

## 2019-09-27 DIAGNOSIS — Z87891 Personal history of nicotine dependence: Secondary | ICD-10-CM | POA: Diagnosis not present

## 2019-09-27 LAB — CBC WITH DIFFERENTIAL/PLATELET
Abs Immature Granulocytes: 0.01 10*3/uL (ref 0.00–0.07)
Basophils Absolute: 0 10*3/uL (ref 0.0–0.1)
Basophils Relative: 1 %
Eosinophils Absolute: 0.2 10*3/uL (ref 0.0–0.5)
Eosinophils Relative: 4 %
HCT: 30.4 % — ABNORMAL LOW (ref 36.0–46.0)
Hemoglobin: 9 g/dL — ABNORMAL LOW (ref 12.0–15.0)
Immature Granulocytes: 0 %
Lymphocytes Relative: 34 %
Lymphs Abs: 1.7 10*3/uL (ref 0.7–4.0)
MCH: 25.9 pg — ABNORMAL LOW (ref 26.0–34.0)
MCHC: 29.6 g/dL — ABNORMAL LOW (ref 30.0–36.0)
MCV: 87.4 fL (ref 80.0–100.0)
Monocytes Absolute: 0.3 10*3/uL (ref 0.1–1.0)
Monocytes Relative: 7 %
Neutro Abs: 2.7 10*3/uL (ref 1.7–7.7)
Neutrophils Relative %: 54 %
Platelets: 188 10*3/uL (ref 150–400)
RBC: 3.48 MIL/uL — ABNORMAL LOW (ref 3.87–5.11)
RDW: 15.2 % (ref 11.5–15.5)
WBC: 4.9 10*3/uL (ref 4.0–10.5)
nRBC: 0 % (ref 0.0–0.2)

## 2019-09-27 MED ORDER — IRON SUCROSE 20 MG/ML IV SOLN
200.0000 mg | Freq: Once | INTRAVENOUS | Status: AC
  Administered 2019-09-27: 200 mg via INTRAVENOUS
  Filled 2019-09-27: qty 10

## 2019-09-27 MED ORDER — SODIUM CHLORIDE 0.9 % IV SOLN
Freq: Once | INTRAVENOUS | Status: AC
  Filled 2019-09-27: qty 250

## 2019-09-27 NOTE — Patient Outreach (Signed)
[facility] Care Management  [place]  09/27/2019   Debra Saunders 05-10-1946 [id number]  Subjective: Telephone call to patient for referral received from MD office for concern of safety/fall prevention.  Spoke with patient. She reports some issues with dizziness but no falls.  Patient states she does not check her blood pressure when she feels dizzy.  Discussed with patient needing to check her blood pressure during those times for possible fluctuations and the possible need for medication adjustments.  She verbalized understanding. Patient states she utilizes her rollator to get around.  Patient states she does have a life alert system that she wears around her neck. She lives alone with limited access to transportation. Referral was placed to C3 team to assist with transportation.  Discussed with patient transportation needs. She does use her [employer] benefit but that is limited.  Patient states her daughter Debra Saunders will take her to appointments as well if she cannot get a ride.  Patient states she has an appointment with the cancer center today for blood work as she has low hemoglobin and she receives iron injections.  She states the doctor thinks she is bleeding somewhere but they have not been able to tell where. Patient also has diabetes but manages well per patient. She does check her sugar daily and it runs in the 100's per patient. Patient also has heart failure and weighs daily with last weight of 181 lbs.  Discussed Blood sugar and heart failure management with patient.  Patient also part of the paramedicine program and they come every two weeks to help fill pill box.   Patient reports she has had her first COVID-19 vaccine and is scheduled later this month for second one.   Discussed [facility] services and support. Patient agreeable to RN CM phone calls at this time.   Objective:   Encounter Medications:  Outpatient Encounter Medications as of 09/27/2019  Medication Sig  Note  . Accu-Chek Softclix Lancets lancets TEST UP TO FOUR TIMES DAILY AS DIRECTED   . acetaminophen (TYLENOL) 325 MG tablet Take 325-650 mg by mouth every 6 (six) hours as needed for mild pain. Reported on 10/10/2015   . albuterol (PROVENTIL) (2.5 MG/3ML) 0.083% nebulizer solution Take 3 mLs (2.5 mg total) by nebulization every 6 (six) hours.   [name] amLODipine (NORVASC) 5 MG tablet Take 1 tablet (5 mg total) by mouth daily.   [name] apixaban (ELIQUIS) 5 MG TABS tablet Take 1 tablet (5 mg total) by mouth 2 (two) times daily.   . Blood Glucose Monitoring Suppl (ACCU-CHEK AVIVA PLUS) w/Device KIT USE AS DIRECTED   . dicyclomine (BENTYL) 20 MG tablet Take 20 mg by mouth every 6 (six) hours.   . digoxin (LANOXIN) 0.125 MG tablet Take 1 tablet by mouth once daily   . Dulaglutide ([name]) 0.56 PV/9.4IA SOPN INJECT 0.75MG SUBCUTANEOUSLY ONCE A WEEK   . gabapentin (NEURONTIN) 100 MG capsule TAKE 2 CAPSULES BY MOUTH THREE TIMES DAILY   . 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 meclizine (ANTIVERT) 25 MG tablet Take 1 tablet (25 mg total) by mouth 3 (three) times daily as needed for dizziness.   . Menthol, Topical Analgesic, (BIOFREEZE EX) Apply 1 application topically as needed (for pain).    . ondansetron (ZOFRAN) 8 MG tablet Take 1 tablet (8 mg total) by mouth every 8 (eight) hours as needed for nausea or vomiting.   Jerrell Belfast  10 MG tablet TAKE 1 TABLET BY MOUTH EVERY DAY   . OXYGEN Inhale 4 L into the lungs continuous.   . potassium chloride SA (K-DUR) 20 MEQ tablet Take 1 tablet (20 mEq total) by mouth daily.   . rosuvastatin (CRESTOR) 10 MG tablet Take 1 tablet (10 mg total) by mouth daily.   . sildenafil (REVATIO) 20 MG tablet Take 1 tablet (20 mg total) by mouth 3 (three) times daily.   [name] spironolactone (ALDACTONE) 25 MG tablet TAKE 1 TABLET BY MOUTH AT BEDTIME   . torsemide (DEMADEX) 20 MG tablet TAKE 3 TABLETS BY MOUTH TWICE DAILY    . UPTRAVI 800 MCG TABS Take 1,200 mcg by mouth 2 (two) times daily. 09/26/2019: Total of 1600 mcg BID  . Selexipag 200 MCG TABS Take 2 tablets (400MCG) by mouth TWICE a day    No facility-administered encounter medications on file as of 09/27/2019.    Functional Status:  In your present state of health, do you have any difficulty performing the following activities: 09/27/2019 02/14/2019  Hearing? N N  Vision? N N  Difficulty concentrating or making decisions? N N  Walking or climbing stairs? Y Y  Comment has ramp/ weakness -  Dressing or bathing? N N  Doing errands, shopping? [name]  Comment daughter takes her Has established transportation services in place; family also assists with transportation as able  Conservation officer, nature and eating ? N N  Using the Toilet? N N  In the past six months, have you accidently leaked urine? Y N  Do you have problems with loss of bowel control? Y N  Managing your Medications? N N  Comment paramedicine/ q 2 weeks -  Managing your Finances? Y Y  Comment - Family assists as indicated  Runner, broadcasting/film/video? [name]  Comment family helps Family assists as indicated  Some recent data might be hidden    Fall/Depression Screening: Fall Risk  09/27/2019 08/09/2019 07/06/2019  Falls in the past year? 0 0 0  Comment - - -  Number falls in past yr: - - 0  Comment - - -  Injury with Fall? - - -  Comment - - -  Risk for fall due to : - - -  Follow up - - Falls evaluation completed   PHQ 2/9 Scores 09/27/2019 07/06/2019 02/14/2019 07/02/2018 06/25/2018 06/02/2018 03/24/2018  PHQ - 2 Score 0 0 0 0 0 0 1  PHQ- 9 Score - - - - - - -    Assessment:  Patient struggles to manage chronic health issues and will benefit from telephonic outreach of care management.  Plan:  [facility] CM Care Plan Problem One     Most Recent Value  Care Plan Problem One  Knowledge Deficit Hypertension  Role Documenting the Problem One  Care Management Telephonic Coordinator  Care Plan  for Problem One  Active  [facility] Long Term Goal   Over the next 90 days, patient will demonstrate and/or verbalize understanding of self-health management for HTN.  [facility] Long Term Goal Start Date  09/27/19  Interventions for Problem One Long Term Goal  RN CM reviewed with patient importance medication adherence, low salt diet and exercise.     RN CM will provide ongoing education and support to patient through phone calls.   RN CM will send welcome packet with consent to patient.   RN CM will send initial barriers letter, assessment, and care plan to primary care physician.   RN  CM will contact patient again in the month of April and patient agrees to next contact.

## 2019-09-27 NOTE — Assessment & Plan Note (Addendum)
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  Proceed with Venofer today.  #Etiology-iron deficient is unclear.  Question CKD versus others.  Colonoscopy March 2020-polyps.  Stool occult-negative.  #History of DVT/s/p IVC filter; ?  A. Fib-Eliquis-stable.  #Borderline low B12-230; s/p B12 injection.  Recommend B12 in 3 months.  #Incidental right axillary mass-however clinically no significant mass noted.  Await ultrasound as recommended by mammography.  CT scan February 2021-no evidence of adenopathy.  #Bilateral ovarian cysts-followed by gynecology; awaiting GYN oncology evaluation tomorrow.  # DISPOSITION:   # venofer today # Follow up in 52months-MD-labs- cbc;bmp; iron studies/ferritin;b12 levels; ;possible Venofer; B12 injection- Dr.B

## 2019-09-27 NOTE — Progress Notes (Signed)
[place] NOTE  Patient Care Team: [name], MD as PCP - General (Family Medicine) Wellington Hampshire, MD as PCP - Cardiology (Cardiology) Deboraha Sprang, MD as PCP - Electrophysiology (Cardiology) Thom Chimes, MD as Referring Physician (Allergy) Juanito Doom, MD as Referring Physician (Internal Medicine) De Hollingshead, [facility] as Pharmacist (Pharmacist) Cammie Sickle, MD as Consulting Physician (Hematology and Oncology) Jon Billings, RN as [place] Management Anemia CHIEF COMPLAINTS/PURPOSE OF CONSULTATION: Anemia   HEMATOLOGY HISTORY  # IRON DEFICIENCY ANEMIA-Jan today 21 PCP Hb-8.8; Antonieta Pert sat-5%; ferritin 11] EGD-; colonoscopy MARCH 2020 -polyp [[facility]-hold further work-up secondary to cardiac issues]; remote-EGD-?;  February 2021-LDH/haptoglobin normal; kappa lambda light chain ratio/M panel normal;  on Venofer; November 2020-CT scan question early cirrhosis/no splenomegaly.  No acute process; MARCH 2021- STOOL OCCULT x3- NEGATIVE.   #B12 deficiency-IM every 3 to 4 months  #2014 -question blood transfusion  [patient admitted for stroke]; CHF chronic -[ 2004 on 4 lits; Dr.Arida/Klein]; CT scan chest 2021 -no adenopathy; fibrotic changes; walker; CKD stage III-   HISTORY OF PRESENTING ILLNESS:  Debra Saunders 74 y.o.  female with multiple medical problems is here for follow-up for iron deficient anemia.  In the interim patient is referred to gynecology oncology for bilateral rinses.  Appointment tomorrow.  Also noted to have an "lump" on the right underarm-incidental mammogram.  Bilateral mammogram otherwise negative-no acute process..  Patient continues to be tired.  Shortness of breath on exertion.  No blood in stools or black or stools.   Review of Systems  Constitutional: Positive for malaise/fatigue. Negative for chills, diaphoresis, fever and weight loss.  HENT: Negative for nosebleeds and sore  throat.   Eyes: Negative for double vision.  Respiratory: Positive for shortness of breath. Negative for cough, hemoptysis, sputum production and wheezing.   Cardiovascular: Negative for chest pain, palpitations, orthopnea and leg swelling.  Gastrointestinal: Negative for abdominal pain, blood in stool, constipation, diarrhea, heartburn, melena, nausea and vomiting.  Genitourinary: Negative for dysuria, frequency and urgency.  Musculoskeletal: Negative for back pain and joint pain.  Skin: Negative.  Negative for itching and rash.  Neurological: Negative for dizziness, tingling, focal weakness, weakness and headaches.  Endo/Heme/Allergies: Does not bruise/bleed easily.  Psychiatric/Behavioral: Negative for depression. The patient is not nervous/anxious and does not have insomnia.     MEDICAL HISTORY:  Past Medical History:  Diagnosis Date  . Arthritis   . Atrial fibrillation, persistent ([place])    a. s/p TEE-guided DCCV on 08/20/2017  . Cardiorenal syndrome    a. 09/2017 Creat rose to 2.7 w/ diuresis-->HD x 2.  . Chest pain    a. 01/2012 Cath: nonobs dzs;  c. 04/2014 MV: No ischemia.  . Chronic back pain   . Chronic combined systolic (congestive) and diastolic (congestive) heart failure ([place])    a. Dx 2005 @ [facility];  b. 02/2014 Echo: Ef 55-60%, no rwma, mild MR, PASP 93mHg. c. 07/2017: EF 30-35%, diffuse HK, trivial AI, severe MR, moderate TR; d. 09/2017 Echo: EF 40-45%, ant, antsep, ap, basal HK; e. 02/2018 Echo: EF 55-60%, Gr1 DD. Ao sclerosis w/o stenosis. Mild to mod MR. Mildly dil LA. Mildly reduced RV fxn. Mild TR. PASP 35-48mg.  . Marland Kitchenhronic respiratory failure (HCC)    a. on home O2  . COPD (chronic obstructive pulmonary disease) ([place]   a. Home O2 - 3lpm  . Gastritis   . History of intracranial hemorrhage    a.  6/14 described by neurosurgery as "small frontal posttraumatic SAH " - pt was on xarelto @ the time.  . Hyperlipidemia   . Hypertension   . Lipoma of back   . Lymphedema     a. Chronic LLE edema.  [name] NICM (nonischemic cardiomyopathy) ([place])    a. 09/2017 Echo: EF 40-45%; b. 09/2017 Cath: minor, insignificant CAD; c. 02/2018 Echo: EF 55-60%, Gr1 DD.  [name] Obesity   . PAH (pulmonary artery hypertension) ([place])    a. 09/2017 Echo: PASP 59mHg; b. 09/2017 RHC: RA 23, RV 84/13, PA 84/29, PCWP 20, CO 3.89, CI 1.89. LVEDP 18.  . Sepsis with metabolic encephalopathy ([place]   . Sleep apnea   . Streptococcal bacteremia   . Stroke ([place]   . Subarachnoid hemorrhage ([place] 01/19/2013  . Symptomatic bradycardia    a. s/p BSX PPM.  . Thyroid nodule   . Type II diabetes mellitus ([place]   . Urinary incontinence     SURGICAL HISTORY: Past Surgical History:  Procedure Laterality Date  . ABDOMINAL HYSTERECTOMY  1969  . CARDIAC CATHETERIZATION  2013   [place] No obstructive CAD: Only 20% ostial left circumflex.  .Marland KitchenCARDIOVERSION N/A 10/12/2017   Procedure: CARDIOVERSION;  Surgeon: GMinna Merritts MD;  Location: [facility] ORS;  Service: Cardiovascular;  Laterality: N/A;  . CHOLECYSTECTOMY    . COLONOSCOPY WITH PROPOFOL N/A 09/10/2018   Procedure: COLONOSCOPY WITH PROPOFOL;  Surgeon: EManya Silvas MD;  [facility];  Service: Endoscopy;  Laterality: N/A;  . CYST REMOVAL TRUNK  2002   BACK  . DIALYSIS/PERMA CATHETER INSERTION N/A 10/06/2017   Procedure: DIALYSIS/PERMA CATHETER INSERTION;  Surgeon: SKatha Cabal MD;  [facility];  Service: Cardiovascular;  Laterality: N/A;  . INSERT / REPLACE / REMOVE PACEMAKER    . lipoma removal  2014   back  . PACEMAKER INSERTION  [address] pacemaker implanted by Dr KCaryl Comesfor symptomatic bradycardia  . PERMANENT PACEMAKER INSERTION N/A 01/09/2014   Procedure: PERMANENT PACEMAKER INSERTION;  Surgeon: SDeboraha Sprang MD;  [facility];  Service: Cardiovascular;  Laterality: N/A;  . RIGHT HEART CATH N/A 04/19/2018   Procedure: RIGHT HEART CATH;  Surgeon: MLarey Dresser MD;   [place];  Service: Cardiovascular;  Laterality: N/A;  . RIGHT/LEFT HEART CATH AND CORONARY ANGIOGRAPHY N/A 10/19/2017   Procedure: RIGHT/LEFT HEART CATH AND CORONARY ANGIOGRAPHY;  Surgeon: AWellington Hampshire MD;  [facility];  Service: Cardiovascular;  Laterality: N/A;  . TEE WITHOUT CARDIOVERSION N/A 08/20/2017   Procedure: TRANSESOPHAGEAL ECHOCARDIOGRAM (TEE) & Direct current cardioversion;  Surgeon: GMinna Merritts MD;  Location: [facility] ORS;  Service: Cardiovascular;  Laterality: N/A;  . TRIGGER FINGER RELEASE      SOCIAL HISTORY: Social History   Socioeconomic History  . Marital status: Divorced    Spouse name: Not on file  . Number of children: 4  . Years of education: 112 . Highest education level: High school graduate  Occupational History  . Occupation: Retired- factory work    Comment: exposed to fPharmacist, community Tobacco Use  . Smoking status: Former Smoker    Packs/day: 0.30    Years: 2.00    Pack years: 0.60    Types: Cigarettes    Quit date: 06/23/1990    Years since quitting: 29.2  . Smokeless tobacco: Never Used  Substance and Sexual Activity  . Alcohol use: No  .  Drug use: No  . Sexual activity: Not Currently  Other Topics Concern  . Not on file  Social History Narrative   Lives in [place] with daughter. Has 4 children. No pets.      Work - Restaurant manager, fast food, retired      Diet - regular      02/14/2019: reports has Meals On Wheels delivered to her home;       Remote hx of smoking > 30 years; No alcohol-    Social Determinants of Health   Financial Resource Strain:   . Difficulty of Paying Living Expenses:   Food Insecurity:   . Worried About Charity fundraiser in the Last Year:   . Arboriculturist in the Last Year:   Transportation Needs: Unmet Transportation Needs  . Lack of Transportation (Medical): Yes  . Lack of Transportation (Non-Medical): No  Physical Activity:   . Days of Exercise per Week:   . Minutes of  Exercise per Session:   Stress:   . Feeling of Stress :   Social Connections:   . Frequency of Communication with Friends and Family:   . Frequency of Social Gatherings with Friends and Family:   . Attends Religious Services:   . Active Member of Clubs or Organizations:   . Attends Archivist Meetings:   [name] Marital Status:   Intimate Partner Violence:   . Fear of Current or Ex-Partner:   . Emotionally Abused:   [name] Physically Abused:   . Sexually Abused:     FAMILY HISTORY: Family History  Problem Relation Age of Onset  . Heart disease Mother   . Hypertension Mother   . COPD Mother        was a smoker  . Thyroid disease Mother   . Hypertension Father   . Hypertension Sister   . Thyroid disease Sister   . Breast cancer Sister   . Hypertension Sister   . Thyroid disease Sister   . Breast cancer Maternal Aunt   . Kidney disease Neg Hx   . Bladder Cancer Neg Hx     ALLERGIES:  is allergic to aspirin; other; and penicillins.  MEDICATIONS:  Current Outpatient Medications  Medication Sig Dispense Refill  . Accu-Chek Softclix Lancets lancets TEST UP TO FOUR TIMES DAILY AS DIRECTED 400 each 0  . acetaminophen (TYLENOL) 325 MG tablet Take 325-650 mg by mouth every 6 (six) hours as needed for mild pain. Reported on 10/10/2015    . albuterol (PROVENTIL) (2.5 MG/3ML) 0.083% nebulizer solution Take 3 mLs (2.5 mg total) by nebulization every 6 (six) hours. 75 mL 12  . amLODipine (NORVASC) 5 MG tablet Take 1 tablet (5 mg total) by mouth daily. 90 tablet 3  . apixaban (ELIQUIS) 5 MG TABS tablet Take 1 tablet (5 mg total) by mouth 2 (two) times daily. 60 tablet 11  . Blood Glucose Monitoring Suppl (ACCU-CHEK AVIVA PLUS) w/Device KIT USE AS DIRECTED 1 kit 0  . dicyclomine (BENTYL) 20 MG tablet Take 20 mg by mouth every 6 (six) hours.    . digoxin (LANOXIN) 0.125 MG tablet Take 1 tablet by mouth once daily 90 tablet 3  . Dulaglutide ([name]) 4.09 WJ/1.9JY SOPN INJECT 0.75MG  SUBCUTANEOUSLY ONCE A WEEK 4 pen 2  . gabapentin (NEURONTIN) 100 MG capsule TAKE 2 CAPSULES BY MOUTH THREE TIMES DAILY 180 capsule 1  . glucose blood (TRUE METRIX BLOOD GLUCOSE TEST) test strip Check blood glucose twice daily Dx:E11.8, Z79.4 100 each  12  . meclizine (ANTIVERT) 25 MG tablet Take 1 tablet (25 mg total) by mouth 3 (three) times daily as needed for dizziness. 30 tablet 0  . Menthol, Topical Analgesic, (BIOFREEZE EX) Apply 1 application topically as needed (for pain).     . ondansetron (ZOFRAN) 8 MG tablet Take 1 tablet (8 mg total) by mouth every 8 (eight) hours as needed for nausea or vomiting. 30 tablet 2  . OPSUMIT 10 MG tablet TAKE 1 TABLET BY MOUTH EVERY DAY 30 tablet 3  . OXYGEN Inhale 4 L into the lungs continuous.    . potassium chloride SA (K-DUR) 20 MEQ tablet Take 1 tablet (20 mEq total) by mouth daily. 90 tablet 3  . rosuvastatin (CRESTOR) 10 MG tablet Take 1 tablet (10 mg total) by mouth daily. 90 tablet 0  . Selexipag 200 MCG TABS Take 2 tablets (400MCG) by mouth TWICE a day    . sildenafil (REVATIO) 20 MG tablet Take 1 tablet (20 mg total) by mouth 3 (three) times daily. 270 tablet 3  . spironolactone (ALDACTONE) 25 MG tablet TAKE 1 TABLET BY MOUTH AT BEDTIME 30 tablet 3  . torsemide (DEMADEX) 20 MG tablet TAKE 3 TABLETS BY MOUTH TWICE DAILY 540 tablet 0  . UPTRAVI 800 MCG TABS Take 1,200 mcg by mouth 2 (two) times daily.     No current facility-administered medications for this visit.      PHYSICAL EXAMINATION:   Vitals:   09/27/19 1320  BP: (!) 122/59  Pulse: 60  Temp: 97.9 F (36.6 C)  SpO2: 100%   Filed Weights   09/27/19 1320  Weight: 184 lb (83.5 kg)    Physical Exam  Constitutional: She is oriented to person, place, and time and well-developed, well-nourished, and in no distress.  Patient in wheelchair.  Alone.  She is on O2 nasal cannula.  HENT:  Head: Normocephalic and atraumatic.  Mouth/Throat: Oropharynx is clear and moist. No  oropharyngeal exudate.  Eyes: Pupils are equal, round, and reactive to light.  Cardiovascular: Normal rate and regular rhythm.  Pulmonary/Chest: No respiratory distress. She has no wheezes.  Decreased air entry bilaterally.  Abdominal: Soft. Bowel sounds are normal. She exhibits no distension and no mass. There is no abdominal tenderness. There is no rebound and no guarding.  Musculoskeletal:        General: No tenderness or edema. Normal range of motion.     Cervical back: Normal range of motion and neck supple.  Neurological: She is alert and oriented to person, place, and time.  Skin: Skin is warm.  Psychiatric: Affect normal.    LABORATORY DATA:  I have reviewed the data as listed Lab Results  Component Value Date   WBC 4.9 09/27/2019   HGB 9.0 (L) 09/27/2019   HCT 30.4 (L) 09/27/2019   MCV 87.4 09/27/2019   PLT 188 09/27/2019   Recent Labs    11/19/18 1517 11/19/18 1517 01/20/19 1121 01/20/19 1121 02/03/19 1413 07/08/19 1504 08/01/19 1524  NA 142  --  137   < > 138 139 137  K 4.5   < > 3.7   < > 4.1 4.4 3.8  CL 99   < > 102   < > 99 101 99  CO2 25   < > 24   < > '30 28 28  ' GLUCOSE 231*  --  128*   < > 114* 102* 94  BUN 23  --  13   < > 26* 26* 24*  CREATININE 1.40*   < > 1.28*   < > 1.39* 1.39* 1.16*  CALCIUM 9.1   < > 9.1   < > 9.7 9.4 9.3  GFRNONAA 37*  --  41*  --   --   --  46*  GFRAA 42*  --  47*  --   --   --  53*  PROT  --   --   --   --  7.6 7.4 8.1  ALBUMIN  --   --   --   --  4.1  --  4.0  AST  --   --   --   --  '15 11 15  ' ALT  --   --   --   --  7 5* 10  ALKPHOS  --   --   --   --  111  --  124  BILITOT  --   --   --   --  0.4 0.4 0.5  BILIDIR  --   --   --   --  0.1  --   --    < > = values in this interval not displayed.     MM 3D SCREEN BREAST BILATERAL  Result Date: 09/23/2019 CLINICAL DATA:  Screening. EXAM: DIGITAL SCREENING BILATERAL MAMMOGRAM WITH TOMO AND CAD COMPARISON:  Previous exam(s). ACR Breast Density Category b: There are  scattered areas of fibroglandular density. FINDINGS: In the right axilla, a possible mass warrants further evaluation. In the left breast, no findings suspicious for malignancy. Images were processed with CAD. IMPRESSION: Further evaluation is suggested for possible mass in the right axilla. RECOMMENDATION: Ultrasound of the right axilla. (Code:[id number]-R-67M) The patient will be contacted regarding the findings, and additional imaging will be scheduled. BI-RADS CATEGORY  0: Incomplete. Need additional imaging evaluation and/or prior mammograms for comparison. Electronically Signed   By: Marin Olp M.D.   On: 09/23/2019 14:53   ECHOCARDIOGRAM COMPLETE  Result Date: 09/14/2019    ECHOCARDIOGRAM REPORT   Patient Name:   Debra Saunders Date of Exam: 09/12/2019 Medical Rec #:  [id number]        Height:       60.0 in Accession #:    [id number]       Weight:       180.0 lb Date of Birth:  Mar 01, 1946         BSA:          1.785 m Patient Age:    12 years         BP:           112/60 mmHg Patient Gender: F                HR:           60 bpm. Exam Location:  [facility] Procedure: 2D Echo, Cardiac Doppler and Color Doppler Indications:    I27.20 Pulmonary Hypertension  History:        Patient has prior history of Echocardiogram examinations, most                 recent 04/16/2018. CHF, CAD, Pacemaker, COPD, Pulmonary HTN,                 Stroke and CKD, Signs/Symptoms:Dizziness/Lightheadedness; Risk                 Factors:Diabetes, Dyslipidemia, Former Smoker and Hypertension.  Patient has been complaining of dizziness. She is on continous                 oxygen.  Sonographer:    Salvadore Dom RVT, RDCS (AE), RDMS Referring Phys: [phone] [place]  1. Left ventricular ejection fraction, by estimation, is 60 to 65%. The left ventricle has normal function. The left ventricle has no regional wall motion abnormalities. Left ventricular diastolic parameters are consistent with Grade II  diastolic dysfunction (pseudonormalization).  2. Right ventricular systolic function is normal. The right ventricular size is normal. There is moderately elevated pulmonary artery systolic pressure. The estimated right ventricular systolic pressure is 48.2 mmHg.  3. Left atrial size was moderately dilated.  4. The mitral valve is normal in structure. Moderate to severe mitral valve regurgitation. No evidence of mitral stenosis.  5. Tricuspid valve regurgitation is mild to moderate.  6. The aortic valve is normal in structure. Aortic valve regurgitation is not visualized. Mild to moderate aortic valve sclerosis/calcification is present, without any evidence of aortic stenosis.  7. Pulmonic valve regurgitation is moderate.  8. The inferior vena cava is normal in size with <50% respiratory variability, suggesting right atrial pressure of 8 mmHg. Comparison(s): EF 60-65%. FINDINGS  Left Ventricle: Left ventricular ejection fraction, by estimation, is 60 to 65%. The left ventricle has normal function. The left ventricle has no regional wall motion abnormalities. The left ventricular internal cavity size was normal in size. There is  no left ventricular hypertrophy. Left ventricular diastolic parameters are consistent with Grade II diastolic dysfunction (pseudonormalization). Right Ventricle: The right ventricular size is normal. No increase in right ventricular wall thickness. Right ventricular systolic function is normal. There is moderately elevated pulmonary artery systolic pressure. The tricuspid regurgitant velocity is 3.23 m/s, and with an assumed right atrial pressure of 8 mmHg, the estimated right ventricular systolic pressure is 50.0 mmHg. Left Atrium: Left atrial size was moderately dilated. Right Atrium: Right atrial size was normal in size. Pericardium: There is no evidence of pericardial effusion. Mitral Valve: The mitral valve is normal in structure. Normal mobility of the mitral valve leaflets. Moderate to  severe mitral valve regurgitation. No evidence of mitral valve stenosis. Tricuspid Valve: The tricuspid valve is normal in structure. Tricuspid valve regurgitation is mild to moderate. No evidence of tricuspid stenosis. Aortic Valve: The aortic valve is normal in structure. Aortic valve regurgitation is not visualized. Mild to moderate aortic valve sclerosis/calcification is present, without any evidence of aortic stenosis. Aortic valve mean gradient measures 8.5 mmHg. Aortic valve peak gradient measures 15.1 mmHg. Aortic valve area, by VTI measures 2.02 cm. Pulmonic Valve: The pulmonic valve was normal in structure. Pulmonic valve regurgitation is moderate. No evidence of pulmonic stenosis. Aorta: The aortic root is normal in size and structure. Venous: The inferior vena cava is normal in size with less than 50% respiratory variability, suggesting right atrial pressure of 8 mmHg. IAS/Shunts: No atrial level shunt detected by color flow Doppler. Additional Comments: A pacer wire is visualized.  LEFT VENTRICLE PLAX 2D LVIDd:         5.04 cm     Diastology LVIDs:         2.98 cm     LV e' lateral:   8.83 cm/s LV PW:         1.09 cm     LV E/e' lateral: 15.3 LV IVS:        0.84 cm     LV e'  medial:    5.41 cm/s LVOT diam:     1.90 cm     LV E/e' medial:  25.0 LV SV:         84 LV SV Index:   47 LVOT Area:     2.84 cm  LV Volumes (MOD) LV vol d, MOD A2C: 95.0 ml LV vol d, MOD A4C: 91.0 ml LV vol s, MOD A2C: 29.0 ml LV vol s, MOD A4C: 19.0 ml LV SV MOD A2C:     66.0 ml LV SV MOD A4C:     91.0 ml LV SV MOD BP:      72.9 ml LEFT ATRIUM             Index LA diam:        4.20 cm 2.35 cm/m LA Vol (A2C):           56.20 ml/m LA Vol (A4C):           50.00 ml/m LA Biplane Vol:         52.80 ml/m  AORTIC VALVE                    PULMONIC VALVE AV Area (Vmax):    2.08 cm     PV Vmax:       0.86 m/s AV Area (Vmean):   2.00 cm     PV Peak grad:  3.0 mmHg AV Area (VTI):     2.02 cm AV Vmax:           194.50 cm/s AV Vmean:           134.500 cm/s AV VTI:            0.414 m AV Peak Grad:      15.1 mmHg AV Mean Grad:      8.5 mmHg LVOT Vmax:         143.00 cm/s LVOT Vmean:        94.700 cm/s LVOT VTI:          0.295 m LVOT/AV VTI ratio: 0.71  AORTA Ao Root diam: 2.90 cm Ao Asc diam:  3.10 cm MITRAL VALVE                TRICUSPID VALVE MV Area (PHT): 3.77 cm     TV Peak grad:   37.0 mmHg MV Decel Time: 201 msec     TV Vmax:        3.04 m/s MR PISA: 81.22 cm          TR Peak grad:   41.7 mmHg MV E velocity: 135.00 cm/s  TR Vmax:        323.00 cm/s MV A velocity: 104.00 cm/s MV E/A ratio:  1.30         SHUNTS                             Systemic VTI:  0.30 m                             Systemic Diam: 1.90 cm [name] MD Electronically signed by [name] MD Signature Date/Time: 09/14/2019/3:55:26 PM    Final    CUP PACEART REMOTE DEVICE CHECK  Result Date: 08/30/2019 Scheduled remote reviewed.  Normal device function.  AF burden <1%, all less than 1 min OAC- Eliquis Next remote 91 days- JBox, RN/CVRS  VAS US CAROTID  Result Date: 09/12/2019 Carotid Arterial Duplex Study Indications:       Carotid artery disease and Patient has been complaining of                    dizziness otherwise no other cerebrovascular symptoms. Risk Factors:      Hypertension, hyperlipidemia, Diabetes, past history of                    smoking, prior CVA. Comparison Study:  None Performing Technologist: Alecia Mackin RVT, RDCS (AE), RDMS  Examination Guidelines: A complete evaluation includes B-mode imaging, spectral Doppler, color Doppler, and power Doppler as needed of all accessible portions of each vessel. Bilateral testing is considered an integral part of a complete examination. Limited examinations for reoccurring indications may be performed as noted.  Right Carotid Findings: +----------+--------+--------+--------+------------------+---------------------+           PSV cm/sEDV cm/sStenosisPlaque DescriptionComments               +----------+--------+--------+--------+------------------+---------------------+ CCA Prox  118     20                                tortuous              +----------+--------+--------+--------+------------------+---------------------+ CCA Distal105     16                                intimal thickening    +----------+--------+--------+--------+------------------+---------------------+ ICA Prox  95      23      1-39%                                           +----------+--------+--------+--------+------------------+---------------------+ ICA Mid   77      25                                vessel dives deep in                                                      neck, used curved 5                                                       probe                 +----------+--------+--------+--------+------------------+---------------------+ ICA Distal80      23                                                      +----------+--------+--------+--------+------------------+---------------------+ ECA       108     14                                                      +----------+--------+--------+--------+------------------+---------------------+ +----------+--------+-------+----------------+-------------------+  PSV cm/sEDV cmsDescribe        Arm Pressure (mmHG) +----------+--------+-------+----------------+-------------------+ [id number]            Multiphasic, [id number]                 +----------+--------+-------+----------------+-------------------+ +---------+--------+--+--------+--+---------+ VertebralPSV cm/s63EDV cm/s23Antegrade +---------+--------+--+--------+--+---------+  Left Carotid Findings: +----------+--------+--------+--------+-----------------------+--------------+           PSV cm/sEDV cm/sStenosisPlaque Description     Comments       +----------+--------+--------+--------+-----------------------+--------------+  CCA Prox  145     20                                     tortuous       +----------+--------+--------+--------+-----------------------+--------------+ CCA Distal74      12                                                    +----------+--------+--------+--------+-----------------------+--------------+ ICA Prox  127     36      1-39%   heterogenous and smoothhigh end range +----------+--------+--------+--------+-----------------------+--------------+ ICA Mid   113     39                                                    +----------+--------+--------+--------+-----------------------+--------------+ ICA Distal84      31                                                    +----------+--------+--------+--------+-----------------------+--------------+ ECA       92      8                                                     +----------+--------+--------+--------+-----------------------+--------------+ +----------+--------+--------+----------------+-------------------+           PSV cm/sEDV cm/sDescribe        Arm Pressure (mmHG) +----------+--------+--------+----------------+-------------------+ Subclavian230             Multiphasic, [id number]                 +----------+--------+--------+----------------+-------------------+ +---------+--------+--+--------+--+ VertebralPSV cm/s63EDV cm/s19 +---------+--------+--+--------+--+   Summary: Right Carotid: Velocities in the right ICA are consistent with a 1-39% stenosis. Left Carotid: Velocities in the left ICA are consistent with high end range 1-39               % stenosis. Vertebrals:  Bilateral vertebral arteries demonstrate antegrade flow. Subclavians: Normal flow hemodynamics were seen in bilateral subclavian              arteries. *See table(s) above for measurements and observations. Suggest follow up study if clinically warranted. Electronically signed by Larae Grooms MD on 09/12/2019 at 5:36:52 PM.    Final      Iron deficiency #Anemia-iron deficiency currently on Venofer status post 3 treatments hemoglobin today is 9 improved from around 8.  Proceed with Venofer today.  #Etiology-iron deficient is unclear.  Question CKD versus others.  Colonoscopy March 2020-polyps.  Stool occult-negative.  #History of DVT/s/p IVC filter; ?  A. Fib-Eliquis-stable.  #Borderline low B12-230; s/p B12 injection.  Recommend B12 in 3 months.  #Incidental right axillary mass-however clinically no significant mass noted.  Await ultrasound as recommended by mammography.  CT scan February 2021-no evidence of adenopathy.  #Bilateral ovarian cysts-followed by gynecology; awaiting GYN oncology evaluation tomorrow.  # DISPOSITION:   # venofer today # Follow up in 46month-MD-labs- cbc;bmp; iron studies/ferritin;b12 levels; ;possible Venofer; B12 injection- Dr.B  All questions were answered. The patient knows to call the clinic with any problems, questions or concerns.    GCammie Sickle MD 09/27/2019 2:46 PM

## 2019-09-28 ENCOUNTER — Inpatient Hospital Stay: Payer: Medicare HMO | Admitting: Family Medicine

## 2019-09-28 ENCOUNTER — Other Ambulatory Visit: Payer: Self-pay | Admitting: Pharmacy Technician

## 2019-09-28 NOTE — Patient Outreach (Signed)
[place] [facility]) Care Management  09/28/2019  ZAFIRA MUNOS [birth year] [id number]    Received both patient and provider portion(s) of patient assistance application(s) for [name]. Faxed completed application and required documents into [employer].  Will follow up with company(ies) in 5-15 business days to check status of application(s).  Trena Dunavan P. Ehren Berisha, [place]  [phone]

## 2019-09-29 ENCOUNTER — Telehealth: Payer: Self-pay | Admitting: Family Medicine

## 2019-09-29 ENCOUNTER — Other Ambulatory Visit (HOSPITAL_COMMUNITY): Payer: Self-pay

## 2019-09-29 ENCOUNTER — Ambulatory Visit (INDEPENDENT_AMBULATORY_CARE_PROVIDER_SITE_OTHER): Payer: Medicare HMO | Admitting: Family Medicine

## 2019-09-29 ENCOUNTER — Ambulatory Visit: Payer: Medicare HMO | Admitting: Physician Assistant

## 2019-09-29 ENCOUNTER — Encounter: Payer: Self-pay | Admitting: Family Medicine

## 2019-09-29 ENCOUNTER — Other Ambulatory Visit: Payer: Self-pay

## 2019-09-29 DIAGNOSIS — R42 Dizziness and giddiness: Secondary | ICD-10-CM

## 2019-09-29 DIAGNOSIS — R300 Dysuria: Secondary | ICD-10-CM | POA: Diagnosis not present

## 2019-09-29 DIAGNOSIS — R928 Other abnormal and inconclusive findings on diagnostic imaging of breast: Secondary | ICD-10-CM | POA: Diagnosis not present

## 2019-09-29 DIAGNOSIS — I10 Essential (primary) hypertension: Secondary | ICD-10-CM

## 2019-09-29 LAB — URINALYSIS, MICROSCOPIC ONLY: RBC / HPF: NONE SEEN

## 2019-09-29 LAB — POCT URINALYSIS DIPSTICK
Bilirubin, UA: NEGATIVE
Blood, UA: NEGATIVE
Glucose, UA: NEGATIVE
Ketones, UA: NEGATIVE
Nitrite, UA: NEGATIVE
Protein, UA: POSITIVE — AB
Spec Grav, UA: 1.02 (ref 1.010–1.025)
Urobilinogen, UA: 0.2 E.U./dL
pH, UA: 5.5 (ref 5.0–8.0)

## 2019-09-29 NOTE — Assessment & Plan Note (Signed)
Diastolic BPs are low.  She is going to discontinue her amlodipine.  She will continue her other medications.

## 2019-09-29 NOTE — Telephone Encounter (Signed)
I called pt and left a vm regarding mammo [place] that Debra Saunders from [facility] will call pt to schedule.

## 2019-09-29 NOTE — Progress Notes (Signed)
[name], MD Phone: [phone]  Debra Saunders is a 74 y.o. female who presents today for f/u.  Lightheadedness: Patient notes this has been an intermittent issue recently.  It can occur when she is just standing there though can also occur when she gets up from seated positions.  When she is cooking if the heat hits her face makes her feel lightheaded as well.  There is no vertiginous symptoms.  No loss of consciousness though she does note her vision has gone dark at times.  No chest pain or shortness of breath.  No palpitations.  She is on amlodipine, digoxin, spironolactone, and torsemide.  She drinks at least 64 ounces of water daily.  The patient reports she progressively lost about 30 pounds and wonders if that made a difference with her blood pressure.  She was purposefully trying to lose weight.  Dysuria: Patient notes this is a recent issue.  Lots of urgency.  No frequency.  No vaginal discharge.  No hematuria.  Some suprapubic discomfort.  Social History   Tobacco Use  Smoking Status Former Smoker  . Packs/day: 0.30  . Years: 2.00  . Pack years: 0.60  . Types: Cigarettes  . Quit date: 06/23/1990  . Years since quitting: 29.2  Smokeless Tobacco Never Used     ROS see history of present illness  Objective  Physical Exam Vitals:   09/29/19 1149  BP: (!) 110/50  Pulse: 60  Temp: (!) 97.5 F (36.4 C)  SpO2: 98%   Laying blood pressure 110/50 pulse 60 Sitting blood pressure 100/50 pulse 57 Standing blood pressure 105/40 pulse 63  BP Readings from Last 3 Encounters:  09/29/19 (!) 110/50  09/27/19 (!) 122/59  09/20/19 118/60   Wt Readings from Last 3 Encounters:  09/29/19 180 lb (81.6 kg)  09/27/19 184 lb (83.5 kg)  09/20/19 180 lb 4 oz (81.8 kg)    Physical Exam Constitutional:      General: She is not in acute distress.    Appearance: She is not diaphoretic.  Cardiovascular:     Rate and Rhythm: Normal rate and regular rhythm.     Heart sounds:  Normal heart sounds.     Comments: No carotid bruits Pulmonary:     Effort: Pulmonary effort is normal.     Breath sounds: Normal breath sounds.  Abdominal:     General: Bowel sounds are normal. There is no distension.     Palpations: Abdomen is soft.     Tenderness: There is abdominal tenderness (Slight lower abdominal). There is no guarding or rebound.  Skin:    General: Skin is warm and dry.  Neurological:     Mental Status: She is alert.      Assessment/Plan: Please see individual problem list.  Lightheadedness Intermittent issue.  Has continued with decrease of amlodipine dose.  We will discontinue her amlodipine given her low diastolic blood pressures.  Monitor for improvement.  She will see cardiology as planned tomorrow.  Dysuria Check urinalysis.  Treatment will be determined based on results.  Hypertension Diastolic BPs are low.  She is going to discontinue her amlodipine.  She will continue her other medications.   Orders Placed This Encounter  Procedures  . POCT Urinalysis Dipstick    No orders of the defined types were placed in this encounter.   This visit occurred during the SARS-CoV-2 public health emergency.  Safety protocols were in place, including screening questions prior to the visit, additional usage of staff PPE,  and extensive cleaning of exam room while observing appropriate contact time as indicated for disinfecting solutions.    [name], MD [place]

## 2019-09-29 NOTE — Patient Instructions (Signed)
Nice to see you. Please discontinue your amlodipine. If you pass out please go to the emergency room. Please see your cardiologist tomorrow as planned. We will contact you with your urine results.

## 2019-09-29 NOTE — Assessment & Plan Note (Signed)
Intermittent issue.  Has continued with decrease of amlodipine dose.  We will discontinue her amlodipine given her low diastolic blood pressures.  Monitor for improvement.  She will see cardiology as planned tomorrow.

## 2019-09-29 NOTE — Assessment & Plan Note (Signed)
Check urinalysis.  Treatment will be determined based on results.

## 2019-09-29 NOTE — Assessment & Plan Note (Signed)
Discussed need for further imaging. She has not had this scheduled yet. I will send a message to our referral coordinator to try to get this scheduled for the patient.

## 2019-09-30 ENCOUNTER — Other Ambulatory Visit: Payer: Self-pay | Admitting: Pharmacy Technician

## 2019-09-30 ENCOUNTER — Ambulatory Visit (INDEPENDENT_AMBULATORY_CARE_PROVIDER_SITE_OTHER): Payer: Medicare HMO | Admitting: Nurse Practitioner

## 2019-09-30 ENCOUNTER — Encounter: Payer: Self-pay | Admitting: Nurse Practitioner

## 2019-09-30 VITALS — BP 140/60 | HR 60 | Ht 60.0 in | Wt 183.0 lb

## 2019-09-30 DIAGNOSIS — I428 Other cardiomyopathies: Secondary | ICD-10-CM | POA: Diagnosis not present

## 2019-09-30 DIAGNOSIS — I4819 Other persistent atrial fibrillation: Secondary | ICD-10-CM

## 2019-09-30 DIAGNOSIS — I5032 Chronic diastolic (congestive) heart failure: Secondary | ICD-10-CM | POA: Diagnosis not present

## 2019-09-30 DIAGNOSIS — R42 Dizziness and giddiness: Secondary | ICD-10-CM | POA: Diagnosis not present

## 2019-09-30 NOTE — Progress Notes (Signed)
Office Visit    Patient Name: Debra Saunders Date of Encounter: 09/30/2019  Primary Care Provider:  [name], MD Primary Cardiologist:  [name], MD  Chief Complaint    74 y/o ? 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  Past Medical History    Past Medical History:  Diagnosis Date  . Arthritis   . Atrial fibrillation, persistent ([place])    a. s/p TEE-guided DCCV on 08/20/2017  . Cardiorenal syndrome    a. 09/2017 Creat rose to 2.7 w/ diuresis-->HD x 2.  . Chest pain    a. 01/2012 Cath: nonobs dzs;  c. 04/2014 MV: No ischemia.  . Chronic back pain   . Chronic combined systolic (congestive) and diastolic (congestive) heart failure ([place])    a. Dx 2005 @ [facility];  b. 02/2014 Echo: EF 55-60%; c. 07/2017: EF 30-35%, diffuse HK, trivial AI, severe MR, moderate TR; d. 09/2017 Echo: EF 40-45%; e. 02/2018 Echo: EF 55-60%, Gr1 DD; f. 08/2019 Echo: EF 60-65%, Gr2 DD, Nl RV fxn, RVSP 49.43mHg. Mod-Sev MR. Mild to mod TR. Mod PR. RA ~ 839mg.  . Marland Kitchenhronic respiratory failure (HCC)    a. on home O2  . COPD (chronic obstructive pulmonary disease) ([place] [place]   a. Home O2 - 3lpm  . Gastritis   . History of intracranial hemorrhage    a. 6/14 described by neurosurgery as "small frontal posttraumatic SAH " - pt was on xarelto @ the time.  .  Hyperlipidemia   . Hypertension   . Interstitial lung disease (HCR[place]  . Lipoma of back   . Lymphedema    a. Chronic LLE edema.  . Marland KitchenICM (nonischemic cardiomyopathy) (HCSed[place]   a. 09/2017 Echo: EF 40-45%; b. 09/2017 Cath: minor, insignificant CAD; c. 02/2018 Echo: EF 55-60%, Gr1 DD; d. 08/2019 Echo: EF 60-65%, Gr2 DD.  . Marland Kitchenbesity   . PAH (pulmonary artery hypertension) (HCR[place]   a. 09/2017 Echo: PASP 6563m; b. 09/2017 RHC: RA 23, RV 84/13, PA 84/29, PCWP 20, CO 3.89, CI 1.89. LVEDP 18.  . Sepsis with metabolic encephalopathy (HCCSequ[place] . Sleep apnea   . Streptococcal bacteremia   . Stroke ([place]CC[place] . Subarachnoid hemorrhage ([facility]CCSt[place]/30/2014  . Symptomatic bradycardia    a. s/p BSX PPM.  . Thyroid nodule   . Type II diabetes mellitus ([place]CCShel[place] . Urinary incontinence    Past Surgical History:  Procedure Laterality Date  . ABDOMINAL HYSTERECTOMY  1969  . CARDIAC CATHETERIZATION  2013   [place] CAD: Only 20% ostial left circumflex.  . CMarland KitchenRDIOVERSION N/A 10/12/2017   Procedure: CARDIOVERSION;  Surgeon: GolMinna MerrittsD;  Location: [facility] ORS;  Service: Cardiovascular;  Laterality: N/A;  . CHOLECYSTECTOMY    . COLONOSCOPY WITH PROPOFOL N/A 09/10/2018   Procedure: COLONOSCOPY WITH PROPOFOL;  Surgeon: EllManya SilvasD;  [facility];  Service: Endoscopy;  Laterality: N/A;  . CYST REMOVAL TRUNK  2002   BACK  .  DIALYSIS/PERMA CATHETER INSERTION N/A 10/06/2017   Procedure: DIALYSIS/PERMA CATHETER INSERTION;  Surgeon: Katha Cabal, MD;  Location: [place] CV LAB;  Service: Cardiovascular;  Laterality: N/A;  . INSERT / REPLACE / REMOVE PACEMAKER    . lipoma removal  2014   back  . PACEMAKER INSERTION  [address] Scientific dual chamber pacemaker implanted by Dr Caryl Comes for symptomatic bradycardia  . PERMANENT PACEMAKER INSERTION N/A 01/09/2014   Procedure: PERMANENT PACEMAKER INSERTION;  Surgeon: Deboraha Sprang, MD;  Location: [facility] CATH LAB;  Service: Cardiovascular;   Laterality: N/A;  . RIGHT HEART CATH N/A 04/19/2018   Procedure: RIGHT HEART CATH;  Surgeon: Larey Dresser, MD;  Location: [place] CV LAB;  Service: Cardiovascular;  Laterality: N/A;  . RIGHT/LEFT HEART CATH AND CORONARY ANGIOGRAPHY N/A 10/19/2017   Procedure: RIGHT/LEFT HEART CATH AND CORONARY ANGIOGRAPHY;  Surgeon: Wellington Hampshire, MD;  Location: [place] CV LAB;  Service: Cardiovascular;  Laterality: N/A;  . TEE WITHOUT CARDIOVERSION N/A 08/20/2017   Procedure: TRANSESOPHAGEAL ECHOCARDIOGRAM (TEE) & Direct current cardioversion;  Surgeon: Minna Merritts, MD;  Location: [facility] ORS;  Service: Cardiovascular;  Laterality: N/A;  . TRIGGER FINGER RELEASE      Allergies  Allergies  Allergen Reactions  . Aspirin Hives and Other (See Comments)    Pt states that she is unable to take because she had bleeding in her brain  . Other Other (See Comments)    Pt states that she is unable to take blood thinners because she had bleeding in her brain; Blood Thinners-per doctor at [facility]  . Penicillins Hives, Shortness Of Breath, Swelling and Other (See Comments)    Has patient had a PCN reaction causing immediate rash, facial/tongue/throat swelling, SOB or lightheadedness with hypotension: Yes Has patient had a PCN reaction causing severe rash involving mucus membranes or skin necrosis: No Has patient had a PCN reaction that required hospitalization No Has patient had a PCN reaction occurring within the last 10 years: No If all of the above answers are "NO", then may proceed with Cephalosporin use.    History of Present Illness    74 y/o ? 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  She previously underwent diagnostic catheterization August 2013 with nonobstructive disease followed by negative stress testing in November 2015.  She has a long history of diastolic heart failure and at one point in February 2019, EF was down to 30-35% but this is subsequently improved.  She was previously followed by the heart failure team in [facility] and last underwent right heart catheterization October 2019 showing moderate pulmonary hypertension but overall optimized filling pressures.  She has since been maintained on uptravi, opsumit, and revatio.  Most recent echocardiogram in March 2021 showed an EF of 28-76%, grade 2 diastolic dysfunction, moderate to severe mitral vegetation, mild to moderate TR, and an RVSP of 49.7 mmHg.  Ms. Shankland has a long history of intermittent lightheadedness that can occur at rest but does seem to be exacerbated by position changes and head  movement.  She has lost roughly 40 pounds since January 2020, which she largely attributes to reduced appetite.  In that setting, she has noticed lower blood pressures at home.  Though she has not experienced syncope, she has had spells of near blacking out while walking in her home.  She was seen by her primary care provider yesterday and amlodipine therapy was discontinued as blood pressures were soft and she did have mild orthostasis when moving from lying to sitting with drop in diastolic pressure when moving from sitting to standing.  She did have a lightheaded spell while waiting to be seen today.  This occurred while sitting, and was very brief in nature.  She also has chronic right-sided chest pain and tenderness.  This is been present  times several years in comes and goes.  Over the past 2 months or more, it has been more or less present all the time.  Does not worsen with activity and is worsened with palpation.  Her breathing overall has been stable, though she continues to note significant dyspnea on exertion.  She is wearing oxygen around-the-clock and activity overall is fairly limited.  She denies PND, orthopnea, chest pain, or palpitations.  As noted above, she does experience early satiety and poor appetite in general, along with left lower extremity swelling.  Home Medications    Prior to Admission medications   Medication Sig Start Date End Date Taking? Authorizing Provider  Accu-Chek Softclix Lancets lancets TEST UP TO FOUR TIMES DAILY AS DIRECTED 05/30/19  Yes [name], MD  acetaminophen (TYLENOL) 325 MG tablet Take 325-650 mg by mouth every 6 (six) hours as needed for mild pain. Reported on 10/10/2015   Yes [provider]  albuterol (PROVENTIL) (2.5 MG/3ML) 0.083% nebulizer solution Take 3 mLs (2.5 mg total) by nebulization every 6 (six) hours. 08/28/17  Yes Wieting, Richard, MD  apixaban (ELIQUIS) 5 MG TABS tablet Take 1 tablet (5 mg total) by mouth 2 (two) times daily. 08/30/18  Yes Larey Dresser, MD  Blood Glucose Monitoring Suppl (ACCU-CHEK AVIVA PLUS) w/Device KIT USE AS DIRECTED 11/11/18  Yes [name], MD  digoxin (LANOXIN) 0.125 MG tablet Take 1 tablet by mouth once daily 08/25/19  Yes Larey Dresser, MD  Dulaglutide ([name]) 7.02 OV/7.8HY SOPN INJECT 0.75MG SUBCUTANEOUSLY ONCE A WEEK 02/14/19  Yes [name], MD  gabapentin (NEURONTIN) 100 MG capsule TAKE 2 CAPSULES BY MOUTH THREE TIMES DAILY 09/15/19  Yes [name], MD  glucose blood (TRUE METRIX BLOOD GLUCOSE TEST) test strip Check blood glucose twice daily Dx:E11.8, Z79.4 09/17/17  Yes [name], MD  meclizine (ANTIVERT) 25 MG tablet Take 1 tablet (25 mg total) by mouth 3 (three) times daily as needed for  dizziness. 07/08/18  Yes [name], MD  Menthol, Topical Analgesic, (BIOFREEZE EX) Apply 1 application topically as needed (for pain).    Yes [provider]  ondansetron (ZOFRAN) 8 MG tablet Take 1 tablet (8 mg total) by mouth every 8 (eight) hours as needed for nausea or vomiting. 09/25/14  Yes Jackolyn Confer, MD  OPSUMIT 10 MG tablet TAKE 1 TABLET BY MOUTH EVERY DAY 05/16/19  Yes Larey Dresser, MD  OXYGEN Inhale 4 L into the lungs continuous.   Yes [provider]  potassium chloride SA (K-DUR) 20 MEQ tablet Take 1 tablet (20 mEq total) by mouth daily. 03/16/19  Yes Larey Dresser, MD  rosuvastatin (CRESTOR) 10 MG tablet Take 1  tablet (10 mg total) by mouth daily. 08/19/19  Yes Larey Dresser, MD  Selexipag 200 MCG TABS Take 2 tablets (400MCG) by mouth TWICE a day   Yes [provider]  sildenafil (REVATIO) 20 MG tablet Take 1 tablet (20 mg total) by mouth 3 (three) times daily. 07/11/19  Yes Larey Dresser, MD  spironolactone (ALDACTONE) 25 MG tablet TAKE 1 TABLET BY MOUTH AT BEDTIME 08/09/19  Yes Larey Dresser, MD  torsemide (DEMADEX) 20 MG tablet TAKE 3 TABLETS BY MOUTH TWICE DAILY 08/11/19  Yes Larey Dresser, MD  UPTRAVI 800 MCG TABS Take 1,200 mcg by mouth 2 (two) times daily. 07/13/19  Yes [provider]    Review of Systems    Chronic, stable dyspnea on exertion in the setting of limited activity.  She has chronic left lower extremity swelling which has been stable.  She is chronic right-sided chest pain.  She has chronic lightheadedness which seems to have been worse more recently.  40 pound weight loss over the past year in the setting of poor appetite.  She denies chest pain, palpitations, PND, orthopnea, syncope.  All other systems reviewed and are otherwise negative except as noted above.  Physical Exam    VS:  BP 140/60 (BP Location: Right Arm, Patient Position: Sitting, Cuff Size: Normal)   Pulse 60   Ht 5' (1.524 m)   Wt  183 lb (83 kg)   SpO2 98%   BMI 35.74 kg/m  , BMI Body mass index is 35.74 kg/m. GEN: Well nourished, well developed, in no acute distress. HEENT: normal. Neck: Supple, no JVD, carotid bruits, or masses. Cardiac: RRR, 2/6 systolic murmur loudest along the left sternal border, no rubs, or gallops. No clubbing, cyanosis.  Trace to 1+ left lower leg edema.  Radials/PT 2+ and equal bilaterally.  Respiratory:  Respirations regular and unlabored, clear to auscultation bilaterally. GI: Soft, nontender, nondistended, BS + x 4. MS: no deformity or atrophy. Skin: warm and dry, no rash. Neuro:  Strength and sensation are intact. Psych: Normal affect.  Accessory Clinical Findings    ECG personally reviewed by me today -AV paced, 60- no acute changes.  Lab Results  Component Value Date   WBC 4.9 09/27/2019   HGB 9.0 (L) 09/27/2019   HCT 30.4 (L) 09/27/2019   MCV 87.4 09/27/2019   PLT 188 09/27/2019   Lab Results  Component Value Date   CREATININE 1.16 (H) 08/01/2019   BUN 24 (H) 08/01/2019   NA 137 08/01/2019   K 3.8 08/01/2019   CL 99 08/01/2019   CO2 28 08/01/2019   Lab Results  Component Value Date   ALT 10 08/01/2019   AST 15 08/01/2019   ALKPHOS 124 08/01/2019   BILITOT 0.5 08/01/2019   Lab Results  Component Value Date   CHOL 100 02/03/2019   HDL 51.00 02/03/2019   LDLCALC 25 02/03/2019   TRIG 121.0 02/03/2019   CHOLHDL 2 02/03/2019    Lab Results  Component Value Date   HGBA1C 6.2 02/03/2019    Assessment & Plan    1.  Chronic heart failure with improved EF/nonischemic cardiomyopathy: EF previously low as 30 to 35% in February 2019 though recent echo shows EF of 60 to 65%.  She does have grade 2 diastolic dysfunction and continues to have moderate severe mitral regurgitation.  Overall, symptoms have been stable and weight is down 40 pounds over the past year.  Relatively euvolemic on exam today.  She  remains on spironolactone, torsemide and digoxin therapy.  Heart  rate and blood pressure well controlled today.  2.  Pulmonary arterial hypertension: Stable, chronic dyspnea on exertion on multiple agents for pulmonary hypertension.  She is followed by pulmonology as well as the advanced heart failure clinic in [place].  3.  Lightheadedness/orthostasis: Patient has some degree of chronic lightheadedness though this seems to have worsened recently.  Though symptoms can occur while lying down or sitting, they do seem to be more pronounced immediately after standing or while walking in her home.  She has had near syncope.  Previous pacemaker device interrogations have not revealed any significant arrhythmias.  Digoxin level was normal earlier this year.  She did have orthostatic vital signs at primary care office yesterday with slight drop in systolic pressure from 163-845 from lying to sitting.  Diastolic pressure dropped from 50-40 from sitting to standing though systolic was stable at 364. Amlodipine was discontinued yesterday.  Pressure is higher today at 140/60 though she notes that she was in the 120s at home this morning.  She will continue to follow blood pressure at home and also gauge symptoms.  I suspect that between weight loss of 40 pounds and polypharmacy including multiple medications for pulmonary hypertension, antihypertensive therapy needs have dropped significantly.  4.  Chronic respiratory failure/COPD/interstitial lung disease: No active wheezing.  She notes occasional wheezing for which she uses a nebulizer.  She remains on around-the-clock oxygen and is followed by pulmonology.  5.  Persistent atrial fibrillation: AV paced today.  Remains anticoagulated with Eliquis.  Continue digoxin.  6.  Hyperlipidemia: She remains on statin therapy with an LDL of 25 in August.  Normal LFTs earlier this year.  7.  Disposition: Patient is follow-up with primary care in 1 month.  We will arrange follow-up with Dr. Fletcher Anon in 3 months or sooner if necessary.   Murray Hodgkins, NP 09/30/2019, 5:24 PM

## 2019-09-30 NOTE — Patient Outreach (Signed)
[place] [facility]) Care Management  09/30/2019  Debra Saunders Oct 26, 1945 [id number]    ADDENDUM  Successful outreach call placed to patient in regards to [name] application with [employer].  Spoke to patient, HIPAA identifiers verified.  Informed patient about her approval. Informed her to be expecting a phone call from [employer] [facility] to set up delivery. Informed her if she has not heard from them by Wednesday then she could call them. Patient informed she did not have a writing utensil to write down the number but would outreach me if she has not heard from them on Wednesday. Confirmed patient had name and number.  Will follow up with patient in 7-10 business days to confirm receipt of medication.  Seymour Pavlak P. Berish Bohman, [name]  [phone]

## 2019-09-30 NOTE — Patient Outreach (Signed)
[facility]) Care Management  09/30/2019  Debra Saunders 10/05/1945 [id number]    Care coordination call placed to [place] in regards to patient's [name] application.  Spoke to [place] who informed patient was APPROVED 09/29/2019-06/22/2020. She informed an order was placed but she has no tracking information at this time. She informed [facility] would be outreaching patient to schedule delivery. She informed if patient has not heard from them by 10/05/2019, then she could call [facility] to schedule her delivery.  Will outreach patient with this information.  Konor Noren P. Mohanad Carsten, [place]  [phone]

## 2019-09-30 NOTE — Patient Instructions (Signed)
Medication Instructions:  Your physician recommends that you continue on your current medications as directed. Please refer to the Current Medication list given to you today.  *If you need a refill on your cardiac medications before your next appointment, please call your pharmacy*   Lab Work: None ordered  If you have labs (blood work) drawn today and your tests are completely normal, you will receive your results only by: [name] MyChart Message (if you have MyChart) OR . A paper copy in the mail If you have any lab test that is abnormal or we need to change your treatment, we will call you to review the results.   Testing/Procedures: None ordered    Follow-Up: At [facility], you and your health needs are our priority.  As part of our continuing mission to provide you with exceptional heart care, we have created designated Provider Care Teams.  These Care Teams include your primary Cardiologist (physician) and Advanced Practice Providers (APPs -  Physician Assistants and Nurse Practitioners) who all work together to provide you with the care you need, when you need it.  We recommend signing up for the patient portal called "MyChart".  Sign up information is provided on this After Visit Summary.  MyChart is used to connect with patients for Virtual Visits (Telemedicine).  Patients are able to view lab/test results, encounter notes, upcoming appointments, etc.  Non-urgent messages can be sent to your provider as well.   To learn more about what you can do with MyChart, go to [email or web address].    Your next appointment:   3-4 month(s)  The format for your next appointment:   In Person  Provider:    You may see [name], MD or Murray Hodgkins, NP.

## 2019-10-02 LAB — URINE CULTURE
MICRO NUMBER:: 10341642
SPECIMEN QUALITY:: ADEQUATE

## 2019-10-03 ENCOUNTER — Other Ambulatory Visit: Payer: Self-pay | Admitting: Family Medicine

## 2019-10-03 MED ORDER — SULFAMETHOXAZOLE-TRIMETHOPRIM 800-160 MG PO TABS: 1.0000 | ORAL_TABLET | Freq: Two times a day (BID) | ORAL | 0 refills | Status: DC

## 2019-10-04 ENCOUNTER — Telehealth: Payer: Self-pay | Admitting: Family Medicine

## 2019-10-04 NOTE — Telephone Encounter (Signed)
[facility] CARE MANAGEMENT

## 2019-10-04 NOTE — Telephone Encounter (Signed)
Do you know what this referral was for?

## 2019-10-04 NOTE — Telephone Encounter (Signed)
Rejection Reason - Patient did not respond" [place] and [place] said 13 minutes ago

## 2019-10-05 ENCOUNTER — Other Ambulatory Visit: Payer: Self-pay

## 2019-10-05 ENCOUNTER — Inpatient Hospital Stay (HOSPITAL_BASED_OUTPATIENT_CLINIC_OR_DEPARTMENT_OTHER): Payer: Medicare HMO | Admitting: Obstetrics and Gynecology

## 2019-10-05 ENCOUNTER — Telehealth: Payer: Self-pay | Admitting: Family Medicine

## 2019-10-05 VITALS — BP 98/62 | HR 59 | Temp 98.0°F | Resp 18 | Wt 182.0 lb

## 2019-10-05 DIAGNOSIS — Z803 Family history of malignant neoplasm of breast: Secondary | ICD-10-CM | POA: Diagnosis not present

## 2019-10-05 DIAGNOSIS — D239 Other benign neoplasm of skin, unspecified: Secondary | ICD-10-CM | POA: Diagnosis not present

## 2019-10-05 DIAGNOSIS — N83202 Unspecified ovarian cyst, left side: Secondary | ICD-10-CM | POA: Diagnosis not present

## 2019-10-05 DIAGNOSIS — N83201 Unspecified ovarian cyst, right side: Secondary | ICD-10-CM | POA: Diagnosis not present

## 2019-10-05 DIAGNOSIS — Z86010 Personal history of colonic polyps: Secondary | ICD-10-CM | POA: Diagnosis not present

## 2019-10-05 DIAGNOSIS — N9089 Other specified noninflammatory disorders of vulva and perineum: Secondary | ICD-10-CM | POA: Diagnosis not present

## 2019-10-05 DIAGNOSIS — Z86718 Personal history of other venous thrombosis and embolism: Secondary | ICD-10-CM | POA: Diagnosis not present

## 2019-10-05 DIAGNOSIS — N390 Urinary tract infection, site not specified: Secondary | ICD-10-CM | POA: Diagnosis not present

## 2019-10-05 DIAGNOSIS — D509 Iron deficiency anemia, unspecified: Secondary | ICD-10-CM | POA: Diagnosis not present

## 2019-10-05 DIAGNOSIS — Z87891 Personal history of nicotine dependence: Secondary | ICD-10-CM | POA: Diagnosis not present

## 2019-10-05 NOTE — Progress Notes (Signed)
Gynecologic Oncology Consult Visit   Referring Provider: Dr. Prentice Docker  Chief Complaint: Bilateral ovarian cysts with elevated Roma  Subjective:  Debra Saunders is a 74 y.o. female who is seen in consultation from [name] for bilateral ovarian cysts with elevated ROMA score.   Patient initially presented a 74 year old female G4, P0 from primary care for bilateral ovarian cyst,  asymptomatic and had been incidentally found on CT in October.  Per patient, cyst had been seen in 2014 and patient had not followed up. She had a hysterectomy '50 years ago' for heavy bleeding/presumed benign disease.   CT-04/13/2020 Left ovary: Low-attenuation left ovarian lesion measuring 3.3 x 2.4 cm.  Possible small paraovarian cyst on the left is 1.9 cm Right ovary: Low-attenuation right ovarian lesion is 1.7 cm.  Possible paraovarian cyst on the right is 2.3 cm No free pelvic fluid.  08/17/2019-ultrasound pelvis complete with transvaginal Left ovary: 3.6 x 2.7 x 3.0 cm hyperechoic nodule within left ovary measuring 2.7 x 2.1 x 2.2 cm likely cyst either complicated by internal echoes or containing scattered artifacts.  No septations or mural nodularity Right ovary: Cystic structure within the right adnexa 2.4 x 2.0 x 2.3 cm, simple features, question ovarian versus paraovarian cyst.  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 fib on Eliquis, pacemaker for symptomatic bradycardia, chronic respiratory failure with hypoxia on home oxygen secondary to COPD, pulmonary hypertension, interstitial lung disease, type 2 diabetes, obesity, subarachnoid hemorrhage in setting of Xarelto, IVC filter, DVT, left lower extremity lymphedema, prior stroke, sleep apnea.   Today, patient says she has been diagnosed with a UTI but hasn't started her antibiotics. She has vulvar  lesions that she's had for 'some years' and questions etiology. No prior biopsies or workup. She feels well. Has chronic RUQ abdominal pain. No lower abdominal pain, bleeding, or discharge.     Problem List: Patient Active Problem List   Diagnosis Date Noted  . Dysuria 09/29/2019  . Abnormal mammogram 09/29/2019  . Breast pain, right 08/08/2019  . Bilateral ovarian cysts 08/08/2019  . B12 deficiency 08/02/2019  . Iron deficiency 08/01/2019  . Lightheadedness 07/06/2019  . Acute renal failure ([place])   . Shortness of breath   . Atrial fibrillation ([place]) 09/22/2017  . Acute bilateral low back pain without sciatica 01/28/2017  . Pain in limb 07/22/2016  . Neuropathy 06/09/2016  . Pain in the chest 03/10/2016  . Chronic deep vein thrombosis (DVT) (HCC) 03/03/2016  . Chronic back pain 10/19/2015  . Goiter 04/25/2015  . Cervical disc disorder with radiculopathy of cervical region 03/30/2015  . Elevated LFTs 06/29/2014  . 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 Abnormality of gait 01/13/2014  . Sinoatrial node dysfunction ([place]) 01/09/2014  . Allergic rhinitis 10/26/2013  . Lymphedema 08/03/2013  . Constipation 04/12/2013  . Pulmonary embolus ([place]) 01/19/2013  . Musculoskeletal chest pain 01/19/2013  . Anemia 04/28/2012  . Chronic respiratory failure with hypoxia ([place]) 04/26/2012  . Diabetes ([place]) 03/30/2012  . Pulmonary hypertension, unspecified ([place]) 03/30/2012  . Hyperlipidemia 03/30/2012  . Hypothyroidism 03/30/2012  . Chronic systolic heart failure ([place]) 01/22/2012  . Hypertension     Past Medical History: Past Medical History:  Diagnosis Date  . Arthritis   . Atrial fibrillation, persistent ([place])    a. s/p TEE-guided DCCV on 08/20/2017  . Cardiorenal syndrome  a. 09/2017 Creat rose to 2.7 w/ diuresis-->HD x 2.  . Chest pain    a. 01/2012 Cath: nonobs dzs;  c. 04/2014 MV: No ischemia.  . Chronic back pain   . Chronic combined systolic (congestive) and diastolic  (congestive) heart failure ([place])    a. Dx 2005 @ [facility];  b. 02/2014 Echo: EF 55-60%; c. 07/2017: EF 30-35%, diffuse HK, trivial AI, severe MR, moderate TR; d. 09/2017 Echo: EF 40-45%; e. 02/2018 Echo: EF 55-60%, Gr1 DD; f. 08/2019 Echo: EF 60-65%, Gr2 DD, Nl RV fxn, RVSP 49.67mHg. Mod-Sev MR. Mild to mod TR. Mod PR. RA ~ 844mg.  . Marland Kitchenhronic respiratory failure (HCC)    a. on home O2  . COPD (chronic obstructive pulmonary disease) (HCCasey   a. Home O2 - 3lpm  . Gastritis   . History of intracranial hemorrhage    a. 6/14 described by neurosurgery as "small frontal posttraumatic SAH " - pt was on xarelto @ the time.  . Hyperlipidemia   . Hypertension   . Interstitial lung disease (HCBotines  . Lipoma of back   . Lymphedema    a. Chronic LLE edema.  . Marland KitchenICM (nonischemic cardiomyopathy) (HCSikes   a. 09/2017 Echo: EF 40-45%; b. 09/2017 Cath: minor, insignificant CAD; c. 02/2018 Echo: EF 55-60%, Gr1 DD; d. 08/2019 Echo: EF 60-65%, Gr2 DD.  . Marland Kitchenbesity   . PAH (pulmonary artery hypertension) (HCLake Buck[place]   a. 09/2017 Echo: PASP 6595m; b. 09/2017 RHC: RA 23, RV 84/13, PA 84/29, PCWP 20, CO 3.89, CI 1.89. LVEDP 18.  . Sepsis with metabolic encephalopathy (HCCSanta Cruz . Sleep apnea   . Streptococcal bacteremia   . Stroke ([place]CC[place] . Subarachnoid hemorrhage (HCCMarina del Rey/30/2014  . Symptomatic bradycardia    a. s/p BSX PPM.  . Thyroid nodule   . Type II diabetes mellitus (HCCButner . Urinary incontinence     Past Surgical History: Past Surgical History:  Procedure Laterality Date  . ABDOMINAL HYSTERECTOMY  1969  . CARDIAC CATHETERIZATION  [facility]   [place] [facility] [place]: Only 20% ostial left circumflex.  . CMarland KitchenRDIOVERSION N/A 10/12/2017   Procedure: CARDIOVERSION;  Surgeon: GolMinna MerrittsD;  Location: [facility] ORS;  Service: Cardiovascular;  Laterality: N/A;  . CHOLECYSTECTOMY    . COLONOSCOPY WITH PROPOFOL N/A 09/10/2018   Procedure: COLONOSCOPY WITH PROPOFOL;  Surgeon: EllManya SilvasD;  [facility];   Service: Endoscopy;  Laterality: N/A;  . CYST REMOVAL TRUNK  2002   BACK  . DIALYSIS/PERMA CATHETER INSERTION N/A 10/06/2017   Procedure: DIALYSIS/PERMA CATHETER INSERTION;  Surgeon: SchKatha CabalD;  [facility];  Service: Cardiovascular;  Laterality: N/A;  . INSERT / REPLACE / REMOVE PACEMAKER    . lipoma removal  2014   back  . PACEMAKER INSERTION  [id number] dual chamber pacemaker implanted by Dr KleCaryl Comesr symptomatic bradycardia  . PERMANENT PACEMAKER INSERTION N/A 01/09/2014   Procedure: PERMANENT PACEMAKER INSERTION;  Surgeon: SteDeboraha SprangD;  [facility];  Service: Cardiovascular;  Laterality: N/A;  . RIGHT HEART CATH N/A 04/19/2018   Procedure: RIGHT HEART CATH;  Surgeon: McLLarey DresserD;  Location: [facility] [place] [facility];  Service: Cardiovascular;  Laterality: N/A;  . RIGHT/LEFT HEART CATH AND CORONARY ANGIOGRAPHY N/A 10/19/2017   Procedure: RIGHT/LEFT HEART CATH AND CORONARY ANGIOGRAPHY;  Surgeon: AriWellington HampshireD;  [facility];  Service: Cardiovascular;  Laterality: N/A;  . TEE WITHOUT CARDIOVERSION N/A 08/20/2017   Procedure: TRANSESOPHAGEAL ECHOCARDIOGRAM (TEE) & Direct current cardioversion;  Surgeon: Minna Merritts, MD;  Location: [facility] ORS;  Service: Cardiovascular;  Laterality: N/A;  . TRIGGER FINGER RELEASE      Past Gynecologic History:  G4 P4 Postmenopausal Hysterectomy   OB History:  OB History  Gravida Para Term Preterm AB Living  4         4  SAB TAB Ectopic Multiple Live Births               # Outcome Date GA Lbr Len/2nd Weight Sex Delivery Anes PTL Lv  4 Gravida           3 Gravida           2 Gravida           1 Gravida             Obstetric Comments  Age first pregnancy 4    Family History: Family History  Problem Relation Age of Onset  . Heart disease Mother   . Hypertension Mother   . COPD Mother        was a smoker  . Thyroid disease Mother   . Hypertension  Father   . Hypertension Sister   . Thyroid disease Sister   . Breast cancer Sister   . Hypertension Sister   . Thyroid disease Sister   . Bone cancer Sister   . Breast cancer Maternal Aunt   . Kidney disease Neg Hx   . Bladder Cancer Neg Hx     Social History: Social History   Socioeconomic History  . Marital status: Divorced    Spouse name: Not on file  . Number of children: 4  . Years of education: 13  . Highest education level: High school graduate  Occupational History  . Occupation: Retired- factory work    Comment: exposed to Pharmacist, community  Tobacco Use  . Smoking status: Former Smoker    Packs/day: 0.30    Years: 2.00    Pack years: 0.60    Types: Cigarettes    Quit date: 06/23/1990    Years since quitting: 29.3  . Smokeless tobacco: Never Used  Substance and Sexual Activity  . Alcohol use: No  . Drug use: No  . Sexual activity: Not Currently  Other Topics Concern  . Not on file  Social History Narrative   Lives in [place] with daughter. Has 4 children. No pets.      Work - Restaurant manager, fast food, retired      Diet - regular      02/14/2019: reports has Meals On Wheels delivered to her home;       Remote hx of smoking > 30 years; No alcohol-    Social Determinants of Health   Financial Resource Strain:   . Difficulty of Paying Living Expenses:   Food Insecurity:   . Worried About Charity fundraiser in the Last Year:   . Arboriculturist in the Last Year:   Transportation Needs: Unmet Transportation Needs  . Lack of Transportation (Medical): Yes  . Lack of Transportation (Non-Medical): No  Physical Activity:   . Days of Exercise per Week:   . Minutes of Exercise per Session:   Stress:   . Feeling of Stress :   Social Connections:   . Frequency of Communication with Friends and Family:   . Frequency of Social Gatherings with Friends and  Family:   . Attends Religious Services:   . Active Member of Clubs or Organizations:   . Attends Archivist  Meetings:   [name] Marital Status:   Intimate Partner Violence:   . Fear of Current or Ex-Partner:   . Emotionally Abused:   [name] Physically Abused:   . Sexually Abused:     Allergies: Allergies  Allergen Reactions  . Aspirin Hives and Other (See Comments)    Pt states that she is unable to take because she had bleeding in her brain  . Other Other (See Comments)    Pt states that she is unable to take blood thinners because she had bleeding in her brain; Blood Thinners-[facility]  . Penicillins Hives, Shortness Of Breath, Swelling and Other (See Comments)    Has patient had a PCN reaction causing immediate rash, facial/tongue/throat swelling, SOB or lightheadedness with hypotension: Yes Has patient had a PCN reaction causing severe rash involving mucus membranes or skin necrosis: No Has patient had a PCN reaction that required hospitalization No Has patient had a PCN reaction occurring within the last 10 years: No If all of the above answers are "NO", then may proceed with Cephalosporin use.    Current Medications: Current Outpatient Medications  Medication Sig Dispense Refill  . Accu-Chek Softclix Lancets lancets TEST UP TO FOUR TIMES DAILY AS DIRECTED 400 each 0  . acetaminophen (TYLENOL) 325 MG tablet Take 325-650 mg by mouth every 6 (six) hours as needed for mild pain. Reported on 10/10/2015    . albuterol (PROVENTIL) (2.5 MG/3ML) 0.083% nebulizer solution Take 3 mLs (2.5 mg total) by nebulization every 6 (six) hours. 75 mL 12  . apixaban (ELIQUIS) 5 MG TABS tablet Take 1 tablet (5 mg total) by mouth 2 (two) times daily. 60 tablet 11  . Blood Glucose Monitoring Suppl (ACCU-CHEK AVIVA PLUS) w/Device KIT USE AS DIRECTED 1 kit 0  . digoxin (LANOXIN) 0.125 MG tablet Take 1 tablet by mouth once daily 90 tablet 3  . Dulaglutide ([name]) 6.22 WL/7.9GX SOPN INJECT 0.75MG SUBCUTANEOUSLY ONCE A WEEK 4 pen 2  . gabapentin (NEURONTIN) 100 MG capsule TAKE 2 CAPSULES BY MOUTH THREE  TIMES DAILY 180 capsule 1  . glucose blood (TRUE METRIX BLOOD GLUCOSE TEST) test strip Check blood glucose twice daily Dx:E11.8, Z79.4 100 each 12  . meclizine (ANTIVERT) 25 MG tablet Take 1 tablet (25 mg total) by mouth 3 (three) times daily as needed for dizziness. 30 tablet 0  . Menthol, Topical Analgesic, (BIOFREEZE EX) Apply 1 application topically as needed (for pain).     . ondansetron (ZOFRAN) 8 MG tablet Take 1 tablet (8 mg total) by mouth every 8 (eight) hours as needed for nausea or vomiting. 30 tablet 2  . OPSUMIT 10 MG tablet TAKE 1 TABLET BY MOUTH EVERY DAY 30 tablet 3  . OXYGEN Inhale 4 L into the lungs continuous.    . potassium chloride SA (K-DUR) 20 MEQ tablet Take 1 tablet (20 mEq total) by mouth daily. 90 tablet 3  . rosuvastatin (CRESTOR) 10 MG tablet Take 1 tablet (10 mg total) by mouth daily. 90 tablet 0  . Selexipag 200 MCG TABS Take 2 tablets (400MCG) by mouth TWICE a day    . sildenafil (REVATIO) 20 MG tablet Take 1 tablet (20 mg total) by mouth 3 (three) times daily. 270 tablet 3  . spironolactone (ALDACTONE) 25 MG tablet TAKE 1 TABLET BY MOUTH AT BEDTIME 30 tablet 3  .  sulfamethoxazole-trimethoprim (BACTRIM DS) 800-160 MG tablet Take 1 tablet by mouth 2 (two) times daily. 6 tablet 0  . torsemide (DEMADEX) 20 MG tablet TAKE 3 TABLETS BY MOUTH TWICE DAILY 540 tablet 0  . UPTRAVI 800 MCG TABS Take 1,200 mcg by mouth 2 (two) times daily.     No current facility-administered medications for this visit.   General: negative for fevers, changes in weight or night sweats Skin: negative for changes in moles or sores or rash Eyes: negative for changes in vision HEENT: negative for change in hearing, tinnitus, voice changes Pulmonary: negative for dyspnea, orthopnea, productive cough, wheezing Cardiac: negative for palpitations, pain Gastrointestinal: negative for nausea, vomiting, constipation, diarrhea, hematemesis, hematochezia Genitourinary/Sexual: negative for dysuria,  retention, hematuria, incontinence Ob/Gyn:  negative for abnormal bleeding, or pain Musculoskeletal: negative for pain, joint pain, back pain Hematology: negative for easy bruising, abnormal bleeding Neurologic/Psych: negative for headaches, seizures, paralysis, weakness, numbness   Objective:  Physical Examination:  BP 98/62 (BP Location: Left Arm, Patient Position: Sitting)   Pulse (!) 59   Temp 98 F (36.7 C) (Tympanic)   Resp 18   Wt 182 lb (82.6 kg)   SpO2 100%   BMI 35.54 kg/m     ECOG Performance Status: 1 - Symptomatic but completely ambulatory. She is on supplemental O2 and arrived to clinic in a wheelchair.   GENERAL: elderly female. Accompanied by daughter. On oxygen via nasal cannula HEENT:  PERRL, neck supple with midline trachea. Thyroid without masses.  NODES:  No cervical, supraclavicular, axillary, or inguinal lymphadenopathy palpated.  LUNGS:  Clear to auscultation bilaterally.  No wheezes or rhonchi. HEART:  Regular rate and rhythm. No murmur appreciated. ABDOMEN:  Soft. RUQ ttp. Per patient chronic.Positive, normoactive bowel sounds.  MSK:  No focal spinal tenderness to palpation. Full range of motion bilaterally in the upper extremities. EXTREMITIES:  LLE edema 2-3+ SKIN:  Clear with no obvious rashes or skin changes.  NEURO:  Nonfocal. Well oriented.  Appropriate affect.  Pelvic: EGBUS: scattered vulvar lesions, irregular shape and size. The midline lesion is ~ 1cm and abnormal appearing.  Cervix: absent Vagina: no lesions, no discharge or bleeding Uterus: absent Adnexa: fullness in the midline and bilaterally  Procedure Note: Vulvar biopsy  The risks and benefits of the procedure were reviewed and informed consent obtained. She is aware of increased risk of necrotizing fascitis and need for glycemic control. Time out was performed. The patient received pre-procedure teaching and expressed understanding. The post-procedure instructions were reviewed with  the patient and she expressed understanding. The patient does not have any barriers to learning.  The midline 1 cm lesion was visible. The area was numbed with xylocaine 2% jelly and injected with lidocaine 2%. A biopsy forceps was used to obtained a biopsy.  Hemostasis was obtained with silver nitrate and Monsel's. She tolerated the procedure well. Pain score = 0   Lab Review As her HPI  Radiologic Imaging: As her HPI    Assessment:  LIVIANA MILLS is a 74 y.o. female diagnosed with asymptomatic bilateral ovarian cysts with elevated ROMA score. Elevated creatinine limits utility of HE4 testing. CA125 normal is reassuring as well as short term stability. She has multiple comorbid conditions.  UTI, Bactrim prescribed  Vulvar lesions, biopsy performed today.     Medical co-morbidities complicating care: Type II diabetes mellitus ([place]); Atrial fibrillation, persistent ([place]); Cardiorenal syndrome (09/2017 Creat rose to 2.7 w/ diuresis-->HD x 2); Chronic combined systolic (congestive) and diastolic (congestive) heart failure (  [place]); COPD (chronic obstructive pulmonary disease) ([place]); Hypertension; History of intracranial hemorrhage; prior surgery Plan:   Problem List Items Addressed This Visit    None    Visit Diagnoses    Cysts of both ovaries    -  Primary   Relevant Orders   US PELVIC COMPLETE WITH TRANSVAGINAL   Vulvar lesion       Relevant Orders   Surgical pathology     We discussed options for management. We recommended repeat ultrasound and exam in 6 months for ovarian cyst. Vulvar biopsy performed today. Will follow up results. Precautions given regarding infection including NF. Return for wound check in one week.   The patient's diagnosis, an outline of the further diagnostic and laboratory studies which will be required, the recommendation for surgery, and alternatives were discussed with her and her accompanying family members.  All questions were answered to their  satisfaction.  A total of 70 minutes were spent with the patient/family today; >50% was spent in education, counseling and coordination of care for cystic masses and vulvar lesion.   I personally had a face to face interaction and evaluated the patient jointly with the NP, Debra Saunders.  I have reviewed her history and available records and have performed the key portions of the physical exam including HEENT, general, neurologic, BLE, abdominal exam, pelvic exam with my findings confirming those documented above by the APP.  I have discussed the case with the APP and the patient.  I agree with the above documentation, assessment and plan which was fully formulated by me.  Counseling was completed by me.   I personally saw the patient and performed a substantive portion of this encounter in conjunction with the listed APP as documented above.   Tomasita Beevers Gaetana Michaelis, MD    CC:  Dr. Prentice Docker

## 2019-10-05 NOTE — Telephone Encounter (Signed)
   [facility] 10/05/2019  1st Attempt Name: CHANNON BROUGHER   MRN: [id number]   DOB: February 22, 1946   AGE: 74 y.o.   GENDER: female   PCP [name], MD.   Called pt regarding Community Resource Referral for transportation services and [facility] wait list. Left message for patient to give Care Guide a call. Care Guide will follow up with patient soon.   Follow up on: 10/06/2019  [place], Care Management Phone: [phone] Email: [email or web address]

## 2019-10-05 NOTE — Patient Instructions (Addendum)
Vulva Biopsy, Care After This sheet gives you information about how to care for yourself after your procedure. Your doctor may also give you more specific instructions. If you have problems or questions, contact your doctor. What can I expect after the procedure? After the procedure, it is common to have:  Slight bleeding from the biopsy site.  Soreness at the biopsy site. Follow these instructions at home: Biopsy site care   Follow instructions from your doctor about how to take care of your biopsy site. Make sure you: ? Clean the area using water and mild soap two times a day or as told by your doctor. Gently pat the area dry. ? If you were prescribed an antibiotic ointment, apply it as told by your doctor. Do not stop using the antibiotic even if your condition gets better. ? Take a warm water bath (sitz bath) as needed to help with pain. A sitz bath is taken while you are sitting down. The water should only come up to your hips and cover your butt. ? Leave stitches (sutures), skin glue, or skin tape (adhesive) strips in place. They may need to stay in place for 2 weeks or longer. If tape strips get loose and curl up, you may trim the loose edges. Do not remove tape strips completely unless your doctor says it is okay. ? Check your biopsy area every day for signs of infection. Check for:  More redness, swelling, or pain.  More fluid or blood.  Warmth.  Pus or a bad smell. ? Do not rub the biopsy area after peeing (urinating).  Gently pat the area dry, or use a bottle filled with warm water (peri-bottle) to clean the area.  Gently wipe from front to back. Lifestyle  Wear loose, cotton underwear.  Do not wear tight pants.  Do not use a tampon, douche, or put anything in your vagina for at least 1 week or until your doctor says it is okay.  Do not have sex for at least 1 week or until your doctor says it is okay.  Do not exercise until your doctor says it is okay.  Do not  swim or use a hot tub until your doctor says it is okay. You may shower or take a sitz bath. General instructions  Take over-the-counter and prescription medicines only as told by your doctor.  Use a sanitary pad until the bleeding stops.  Keep all follow-up visits as told by your doctor. This is important. Contact a doctor if:  You have more redness, swelling, or pain around your biopsy site.  You have more fluid or blood coming from your biopsy site.  Your biopsy site feels warm when you touch it.  Medicines do not help with your pain. Get help right away if:  You have a lot of bleeding from the vulva.  You have pus or a bad smell coming from your biopsy site.  You have a fever.  You have pain in the lower belly (abdomen). Summary  After the procedure, it is common to have slight bleeding and soreness at the biopsy site.  Follow all instructions as told by your doctor. Clean the area with water and mild soap. Do not rub. Pat the area dry.  Take sitz baths as needed. Leave any stitches in place.  Check your biopsy site for infection. Signs include more redness, swelling, pain, fluid, or blood, or feeling warm when you touch it.  Get help right away if you have a   lot of bleeding, a fever, pus or a bad smell, or pain in your lower belly. This information is not intended to replace advice given to you by your health care provider. Make sure you discuss any questions you have with your health care provider. Document Revised: 12/10/2017 Document Reviewed: 12/10/2017 Elsevier Patient Education  2020 Elsevier Inc.  

## 2019-10-05 NOTE — Telephone Encounter (Signed)
[place] ([employer]). Spoke with representative regarding transportation services for patient. Representative stated that if patient would like to use [employer] she will need to call and put in her request. Representative stated they do accept Medicaid, but not Medicare insurance. Representative stated that trips are $5 one way and $10 round trip.

## 2019-10-05 NOTE — Progress Notes (Signed)
Patient is here for new patient appointment for bilat ovarian cysts.

## 2019-10-07 ENCOUNTER — Telehealth: Payer: Self-pay

## 2019-10-07 LAB — CUP PACEART INCLINIC DEVICE CHECK
Brady Statistic RA Percent Paced: 71 %
Brady Statistic RV Percent Paced: 22 %
Date Time Interrogation Session: 20210330000000
Implantable Lead Implant Date: 20150720
Implantable Lead Implant Date: 20150720
Implantable Lead Location: 753859
Implantable Lead Location: 753860
Implantable Lead Model: 5076
Implantable Lead Model: 5076
Implantable Pulse Generator Implant Date: 20150720
Lead Channel Impedance Value: 373 Ohm
Lead Channel Impedance Value: 546 Ohm
Lead Channel Pacing Threshold Amplitude: 0.6 V
Lead Channel Pacing Threshold Amplitude: 0.8 V
Lead Channel Pacing Threshold Pulse Width: 0.4 ms
Lead Channel Pacing Threshold Pulse Width: 0.4 ms
Lead Channel Sensing Intrinsic Amplitude: 2.3 mV
Lead Channel Setting Pacing Amplitude: 2 V
Lead Channel Setting Pacing Amplitude: 2.4 V
Lead Channel Setting Pacing Pulse Width: 0.4 ms
Lead Channel Setting Sensing Sensitivity: 2.5 mV
Pulse Gen Serial Number: 390330

## 2019-10-07 LAB — SURGICAL PATHOLOGY

## 2019-10-07 NOTE — Telephone Encounter (Signed)
Called and reviewed vulva biopsy pathology. She reports the site is a little tender with no signs of infection.   DIAGNOSIS:  A. VULVA; BIOPSY:  - BENIGN ANGIOKERATOMA.  - NEGATIVE FOR DYSPLASIA AND MALIGNANCY.

## 2019-10-11 ENCOUNTER — Other Ambulatory Visit: Payer: Self-pay

## 2019-10-11 NOTE — Patient Outreach (Signed)
[place] [facility]) Care Management  [place]  10/11/2019   Debra Saunders December 16, 1945 [id number]  Subjective: Telephone call to patient for disease management follow up. Patient reports she is doing good.  She reports that her appointments with doctors have gone good with everything checking out per patient.  She denies falls.  Discussed fall precautions and continued use of medical alert system.  Advised patient that PCP office contacted her about transportation resources.  She states she will get in touch with them.  Patient continues to check her blood pressure and last reading was 120/60.  Discussed blood pressure control.  Patient reports continued tiredness from anemia but states that her hemoglobin was better at 9 she states.  However she remains tired.  She is eating iron rich foods and reviewed with patient those that are iron rich.  She verbalized understanding and voices no concerns.    Objective:   Encounter Medications:  Outpatient Encounter Medications as of 10/11/2019  Medication Sig Note  . Accu-Chek Softclix Lancets lancets TEST UP TO FOUR TIMES DAILY AS DIRECTED   . acetaminophen (TYLENOL) 325 MG tablet Take 325-650 mg by mouth every 6 (six) hours as needed for mild pain. Reported on 10/10/2015   . albuterol (PROVENTIL) (2.5 MG/3ML) 0.083% nebulizer solution Take 3 mLs (2.5 mg total) by nebulization every 6 (six) hours.   [name] apixaban (ELIQUIS) 5 MG TABS tablet Take 1 tablet (5 mg total) by mouth 2 (two) times daily.   . Blood Glucose Monitoring Suppl (ACCU-CHEK AVIVA PLUS) w/Device KIT USE AS DIRECTED   . digoxin (LANOXIN) 0.125 MG tablet Take 1 tablet by mouth once daily   . Dulaglutide ([name]) 3.53 GD/9.2EQ SOPN INJECT 0.75MG SUBCUTANEOUSLY ONCE A WEEK   . gabapentin (NEURONTIN) 100 MG capsule TAKE 2 CAPSULES BY MOUTH THREE TIMES DAILY   . 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  meclizine (ANTIVERT) 25 MG tablet Take 1 tablet (25 mg total) by mouth 3 (three) times daily as needed for dizziness.   . Menthol, Topical Analgesic, (BIOFREEZE EX) Apply 1 application topically as needed (for pain).    . ondansetron (ZOFRAN) 8 MG tablet Take 1 tablet (8 mg total) by mouth every 8 (eight) hours as needed for nausea or vomiting.   . OPSUMIT 10 MG tablet TAKE 1 TABLET BY MOUTH EVERY DAY   . OXYGEN Inhale 4 L into the lungs continuous.   . potassium chloride SA (K-DUR) 20 MEQ tablet Take 1 tablet (20 mEq total) by mouth daily.   . rosuvastatin (CRESTOR) 10 MG tablet Take 1 tablet (10 mg total) by mouth daily.   . Selexipag 200 MCG TABS Take 2 tablets (400MCG) by mouth TWICE a day   . sildenafil (REVATIO) 20 MG tablet Take 1 tablet (20 mg total) by mouth 3 (three) times daily.   [name] spironolactone (ALDACTONE) 25 MG tablet TAKE 1 TABLET BY MOUTH AT BEDTIME   . sulfamethoxazole-trimethoprim (BACTRIM DS) 800-160 MG tablet Take 1 tablet by mouth 2 (two) times daily.   [name] torsemide (DEMADEX) 20 MG tablet TAKE 3 TABLETS BY MOUTH TWICE DAILY   . UPTRAVI 800 MCG TABS Take 1,200 mcg by mouth 2 (two) times daily. 09/26/2019: Total of 1600 mcg BID   No facility-administered encounter medications on file as of 10/11/2019.    Functional Status:  In your present state of health, do you have any difficulty performing the following  activities: 09/27/2019  Hearing? N  Vision? N  Difficulty concentrating or making decisions? N  Walking or climbing stairs? Y  Comment has ramp/ weakness  Dressing or bathing? N  Doing errands, shopping? Y  Comment daughter takes her  Conservation officer, nature and eating ? N  Using the Toilet? N  In the past six months, have you accidently leaked urine? Y  Comment patient wears depends  Do you have problems with loss of bowel control? Y  Comment patient has diarrhea and wears depends  Managing your Medications? N  Comment paramedicine/ q 2 weeks  Managing your Finances? N   Housekeeping or managing your Housekeeping? Y  Comment family helps  Some recent data might be hidden    Fall/Depression Screening: Fall Risk  09/27/2019 08/09/2019 07/06/2019  Falls in the past year? 0 0 0  Comment - - -  Number falls in past yr: - - 0  Comment - - -  Injury with Fall? - - -  Comment - - -  Risk for fall due to : - - -  Follow up - - Falls evaluation completed   PHQ 2/9 Scores 09/27/2019 07/06/2019 02/14/2019 07/02/2018 06/25/2018 06/02/2018 03/24/2018  PHQ - 2 Score 0 0 0 0 0 0 1  PHQ- 9 Score - - - - - - -    Assessment: Patient continues to manage chronic illnesses and following up with physicians as scheduled.  Plan:  [facility] CM Care Plan Problem One     Most Recent Value  Care Plan Problem One  Knowledge Deficit Hypertension  Role Documenting the Problem One  Care Management Telephonic Coordinator  Care Plan for Problem One  Active  THN Long Term Goal   Over the next 90 days, patient will demonstrate and/or verbalize understanding of self-health management for HTN.  THN Long Term Goal Start Date  09/27/19  Interventions for Problem One Long Term Goal  RN CM discussed blood pressure and importance of medication adherence and watching salt intake.       RN CM will contact patient in the month of May and patient agreeable.    [name], RN, MSN [place] Management Care Management Coordinator Direct Line [phone] Cell [phone] Toll Free: [phone]  Fax: [phone]

## 2019-10-12 ENCOUNTER — Other Ambulatory Visit: Payer: Self-pay | Admitting: Pharmacy Technician

## 2019-10-12 ENCOUNTER — Encounter (HOSPITAL_COMMUNITY): Payer: Self-pay

## 2019-10-12 NOTE — Progress Notes (Signed)
Had a home visit with Debra Saunders to do her med boxes.  She has all her medications and refilled 2 weeks of medications for her.  She is getting automatic refills on her uptravi, opsumit and sildinifil now.  She states doing ok, she has been going through a lot of testing right now, she states she is tired all the time.  Since her sister passed she appears some depressed.  Her daughter is taking her to the beach coming up, hopefully getting her out will cheer her up.  She has a lot of family support that checks on her a lot.  She is compliant with her medications, she watches her high sodium foods and fluid intake.  Remind her a lot of drinking enough fluids.  She states has trouble sleeping at night and sleeps late in the day.  She is trying to eat more meals during the day.   Lungs are clear, she has no swelling in her right leg, she always has edema in left foot and ankle.  Will continue to visit for heart failure and medication compliance.   [place] [phone]

## 2019-10-12 NOTE — Patient Outreach (Signed)
[place] [facility]) Care Management  10/12/2019  [name] 08/06/1945 [id number]   Successful call placed to patient regarding patient assistance medication delivery of [name] with [employer], HIPAA identifiers verified.   Spoke to patient's daughter [name], [place] verified. Patient's daughter informed she believes  her mother had received the [name] but that she was currently asleep. She informed she would have her mother call me back. Provided phone number to [facility].  Follow up:  Will await a return call from patient. If call is not returned today, will follow up with patient in 3-5 business days.  Tess Potts P. Trenae Brunke, [place]  [phone]

## 2019-10-12 NOTE — Patient Outreach (Signed)
[facility] [facility]) Care Management  10/12/2019  Debra Saunders [birth year] [id number]   ADDENDUM   Incoming call from patient regarding patient assistance medication delivery of [name] from [place], HIPAA identifiers verified.   Patient informed she received [name]. Discussed refill procedure with patient. She is set up on auto refills but reiterated to her in case she is down to 1 box of medication and has not heard from [facility] to reach out to them to reorder her medication. Informed patient where to find the phone number on the pharmacy label on the medication box. Patient verbalized understanding. Confirmed patient had name and number as she had no other questions or concerns.  Follow up:  Will route note to embedded [place] for case closure and will remove my name from care team as patient assistance is completed.  Jaqueline Uber P. Arie Gable, [place]  [phone]

## 2019-10-13 NOTE — Telephone Encounter (Signed)
Called [facility]. Spoke with representative regarding [name]. Explained that she needs assistance with cooking and cleaning and that she has [employer]. Representative stated that they do accept Medicare insurance and that patient will need to give them a call to complete a consultation to determine exactly what she needs and then a care plan can will be created.

## 2019-10-13 NOTE — Telephone Encounter (Signed)
   [facility] 10/13/2019 2nd Attempt   Name: Debra Saunders   MRN: [id number]   DOB: [birth year]   AGE: 74 y.o.   GENDER: female   PCP [name], MD.   Called pt regarding Community Resource Referral for transportation services and in-home health services. Patient did not answer, left message for patient to give Care Guide a call to receive resources. For transportation needs patient can use [employer] through [employer] the cost would be $5 one way or $10 roundtrip. Patient meets the requirements to use this service. Also because the patient has Medicare she is not eligible for [facility]. This resource is for patients that have Medicaid. Ms. Bonk can give [facility] a call  ( located in [place]). They will be able to provider her with the assistance she needs. Patient will need to give organization a call to set up a consultation.    [place], Care Management Phone: [phone] Email: [email or web address]

## 2019-10-17 ENCOUNTER — Ambulatory Visit
Admission: RE | Admit: 2019-10-17 | Discharge: 2019-10-17 | Disposition: A | Discharge status: Home / Self Care | Payer: Medicare HMO | Source: Ambulatory Visit | Attending: Family Medicine | Admitting: Family Medicine

## 2019-10-17 ENCOUNTER — Other Ambulatory Visit: Payer: Self-pay | Admitting: Family Medicine

## 2019-10-17 ENCOUNTER — Ambulatory Visit: Payer: Medicare HMO | Attending: Internal Medicine

## 2019-10-17 DIAGNOSIS — R928 Other abnormal and inconclusive findings on diagnostic imaging of breast: Secondary | ICD-10-CM | POA: Insufficient documentation

## 2019-10-17 DIAGNOSIS — R2231 Localized swelling, mass and lump, right upper limb: Secondary | ICD-10-CM

## 2019-10-17 DIAGNOSIS — N6489 Other specified disorders of breast: Secondary | ICD-10-CM | POA: Diagnosis not present

## 2019-10-17 DIAGNOSIS — Z23 Encounter for immunization: Secondary | ICD-10-CM

## 2019-10-17 NOTE — Progress Notes (Signed)
   Covid-19 Vaccination Clinic  Name:  Debra Saunders    MRN: [id number] DOB: [birth year]  10/17/2019  Debra Saunders was observed post Covid-19 immunization for 15 minutes without incident. She was provided with Vaccine Information Sheet and instruction to access the V-Safe system.   Debra Saunders was instructed to call 911 with any severe reactions post vaccine: [name] Difficulty breathing  . Swelling of face and throat  . A fast heartbeat  . A bad rash all over body  . Dizziness and weakness   Immunizations Administered    Name Date Dose VIS Date Route   Pfizer COVID-19 Vaccine 10/17/2019  4:01 PM 0.3 mL 08/17/2018 Intramuscular   Manufacturer: [employer], [employer]   Lot: [id number]   [place]: [id number]

## 2019-10-18 ENCOUNTER — Other Ambulatory Visit (HOSPITAL_COMMUNITY): Payer: Self-pay | Admitting: Cardiology

## 2019-10-19 ENCOUNTER — Inpatient Hospital Stay (HOSPITAL_BASED_OUTPATIENT_CLINIC_OR_DEPARTMENT_OTHER): Payer: Medicare HMO | Admitting: Obstetrics and Gynecology

## 2019-10-19 ENCOUNTER — Other Ambulatory Visit: Payer: Self-pay

## 2019-10-19 VITALS — BP 121/49 | HR 60 | Temp 97.0°F | Resp 18 | Wt 184.0 lb

## 2019-10-19 DIAGNOSIS — N9089 Other specified noninflammatory disorders of vulva and perineum: Secondary | ICD-10-CM

## 2019-10-19 DIAGNOSIS — Z9889 Other specified postprocedural states: Secondary | ICD-10-CM

## 2019-10-19 NOTE — Progress Notes (Signed)
She was just here to see surgery site andno issues from her standpoint

## 2019-10-19 NOTE — Progress Notes (Signed)
Gynecologic Oncology Interval Visit   Referring Provider: Dr. Prentice Docker  Chief Complaint: Bilateral ovarian cysts with elevated Roma  Subjective:  Debra Saunders is a 74 y.o. female who is seen in consultation from [name] for bilateral ovarian cysts with elevated ROMA score.   She presents today for a wound check of the vulvar biopsy performed on 10/05/2019.   DIAGNOSIS:  A. VULVA; BIOPSY:  - BENIGN ANGIOKERATOMA.  - NEGATIVE FOR DYSPLASIA AND MALIGNANCY.   Gynecologic History  Debra Saunders is a pleasant female who is seen in consultation from [name] for bilateral ovarian cysts with elevated ROMA score.   Patient initially presented for bilateral ovarian cyst, asymptomatic and had been incidentally found on CT in October.  Per patient, cyst had been seen in 2014 and patient had not followed up. She had a hysterectomy '50 years ago' for heavy bleeding/presumed benign disease.   CT-04/13/2020 Left ovary: Low-attenuation left ovarian lesion measuring 3.3 x 2.4 cm.  Possible small paraovarian cyst on the left is 1.9 cm Right ovary: Low-attenuation right ovarian lesion is 1.7 cm.  Possible paraovarian cyst on the right is 2.3 cm No free pelvic fluid.  08/17/2019-ultrasound pelvis complete with transvaginal Left ovary: 3.6 x 2.7 x 3.0 cm hyperechoic nodule within left ovary measuring 2.7 x 2.1 x 2.2 cm likely cyst either complicated by internal echoes or containing scattered artifacts.  No septations or mural nodularity Right ovary: Cystic structure within the right adnexa 2.4 x 2.0 x 2.3 cm, simple features, question ovarian versus paraovarian cyst.  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 fib on Eliquis, pacemaker for symptomatic bradycardia, chronic respiratory failure with hypoxia on home oxygen secondary to COPD,  pulmonary hypertension, interstitial lung disease, type 2 diabetes, obesity, subarachnoid hemorrhage in setting of Xarelto, IVC filter, DVT, left lower extremity lymphedema, prior stroke, sleep apnea.   At her consult visit she also noted multiple vulvar lesions and wanted to biopsy the most symptomatic area.    Problem List: Patient Active Problem List   Diagnosis Date Noted  . Dysuria 09/29/2019  . Abnormal mammogram 09/29/2019  . Breast pain, right 08/08/2019  . Bilateral ovarian cysts 08/08/2019  . B12 deficiency 08/02/2019  . Iron deficiency 08/01/2019  . Lightheadedness 07/06/2019  . Acute renal failure ([place])   . Shortness of breath   . Atrial fibrillation ([place]) 09/22/2017  . Acute bilateral low back pain without sciatica 01/28/2017  . Pain in limb 07/22/2016  . Neuropathy 06/09/2016  . Pain in the chest 03/10/2016  . Chronic deep vein thrombosis (DVT) (HCC) 03/03/2016  . Chronic back pain 10/19/2015  . Goiter 04/25/2015  . Cervical disc disorder with radiculopathy of cervical region 03/30/2015  . Elevated LFTs 06/29/2014  . 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 Abnormality of gait 01/13/2014  . Sinoatrial node dysfunction ([place]) 01/09/2014  . Allergic rhinitis 10/26/2013  . Lymphedema 08/03/2013  . Constipation 04/12/2013  . Pulmonary embolus ([place]) 01/19/2013  . Musculoskeletal chest pain 01/19/2013  . Anemia 04/28/2012  . Chronic respiratory failure with hypoxia ([place]) 04/26/2012  . Diabetes ([place]) 03/30/2012  . Pulmonary hypertension, unspecified ([place]) 03/30/2012  . Hyperlipidemia 03/30/2012  . Hypothyroidism 03/30/2012  . Chronic systolic heart failure ([place]) 01/22/2012  . Hypertension     Past Medical History: Past Medical History:  Diagnosis Date  .  Arthritis   . Atrial fibrillation, persistent ([place])    a. s/p TEE-guided DCCV on 08/20/2017  . Cardiorenal syndrome    a. 09/2017 Creat rose to 2.7 w/ diuresis-->HD x 2.  . Chest pain    a. 01/2012 Cath:  nonobs dzs;  c. 04/2014 MV: No ischemia.  . Chronic back pain   . Chronic combined systolic (congestive) and diastolic (congestive) heart failure ([name])    a. Dx 2005 @ [facility];  b. 02/2014 Echo: EF 55-60%; c. 07/2017: EF 30-35%, diffuse HK, trivial AI, severe MR, moderate TR; d. 09/2017 Echo: EF 40-45%; e. 02/2018 Echo: EF 55-60%, Gr1 DD; f. 08/2019 Echo: EF 60-65%, Gr2 DD, Nl RV fxn, RVSP 49.88mHg. Mod-Sev MR. Mild to mod TR. Mod PR. RA ~ 869mg.  . Marland Kitchenhronic respiratory failure (HCC)    a. on home O2  . COPD (chronic obstructive pulmonary disease) ([facility]   a. Home O2 - 3lpm  . Gastritis   . History of intracranial hemorrhage    a. 6/14 described by neurosurgery as "small frontal posttraumatic SAH " - pt was on xarelto @ the time.  . Hyperlipidemia   . Hypertension   . Interstitial lung disease (HCWas[place]  . Lipoma of back   . Lymphedema    a. Chronic LLE edema.  . Marland KitchenICM (nonischemic cardiomyopathy) ([facility]   a. 09/2017 Echo: EF 40-45%; b. 09/2017 Cath: minor, insignificant CAD; c. 02/2018 Echo: EF 55-60%, Gr1 DD; d. 08/2019 Echo: EF 60-65%, Gr2 DD.  . Marland Kitchenbesity   . PAH (pulmonary artery hypertension) ([facility]   a. 09/2017 Echo: PASP 6550m; b. 09/2017 RHC: RA 23, RV 84/13, PA 84/29, PCWP 20, CO 3.89, CI 1.89. LVEDP 18.  . Sepsis with metabolic encephalopathy ([facility]CCDeKal[place] . Sleep apnea   . Streptococcal bacteremia   . Stroke ([facility]CC[place] . Subarachnoid hemorrhage (HCCHattiesburg/30/2014  . Symptomatic bradycardia    a. s/p BSX PPM.  . Thyroid nodule   . Type II diabetes mellitus ([facility]CCChe[place] Lake . Urinary incontinence     Past Surgical History: Past Surgical History:  Procedure Laterality Date  . ABDOMINAL HYSTERECTOMY  1969  . CARDIAC [facility]ATION  [facility]   [facility]: Only 20% ostial left circumflex.  . CMarland KitchenRDIOVERSION N/A 10/12/2017   Procedure: CARDIOVERSION;  Surgeon: GolMinna MerrittsD;  Location: [facility]S;  Service: Cardiovascular;  Laterality: N/A;  . CHOLECYSTECTOMY    . COLONOSCOPY WITH PROPOFOL  N/A 09/10/2018   Procedure: COLONOSCOPY WITH PROPOFOL;  Surgeon: EllManya SilvasD;  [facility];  Service: Endoscopy;  Laterality: N/A;  . CYST REMOVAL TRUNK  2002   BACK  . DIALYSIS/PERMA CATHETER INSERTION N/A 10/06/2017   Procedure: DIALYSIS/PERMA CATHETER INSERTION;  Surgeon: SchKatha CabalD;  [facility];  Service: Cardiovascular;  Laterality: N/A;  . INSERT / REPLACE / REMOVE PACEMAKER    . lipoma removal  2014   back  . PACEMAKER INSERTION  [id number] dual chamber pacemaker implanted by Dr KleCaryl Comesr symptomatic bradycardia  . PERMANENT PACEMAKER INSERTION N/A 01/09/2014   Procedure: PERMANENT PACEMAKER INSERTION;  Surgeon: SteDeboraha SprangD;  [facility];  Service: Cardiovascular;  Laterality: N/A;  . RIGHT HEART CATH N/A 04/19/2018   Procedure: RIGHT HEART CATH;  Surgeon: McLLarey DresserD;  [facility];  Service: Cardiovascular;  Laterality: N/A;  . RIGHT/LEFT HEART CATH AND CORONARY ANGIOGRAPHY N/A 10/19/2017  Procedure: RIGHT/LEFT HEART CATH AND CORONARY ANGIOGRAPHY;  Surgeon: Wellington Hampshire, MD;  Location: [place] [facility] LAB;  Service: Cardiovascular;  Laterality: N/A;  . TEE WITHOUT CARDIOVERSION N/A 08/20/2017   Procedure: TRANSESOPHAGEAL ECHOCARDIOGRAM (TEE) & Direct current cardioversion;  Surgeon: Minna Merritts, MD;  Location: [facility] ORS;  Service: Cardiovascular;  Laterality: N/A;  . TRIGGER FINGER RELEASE      Past Gynecologic History:  G4 P4 Postmenopausal Hysterectomy   OB History:  OB History  Gravida Para Term Preterm AB Living  4         4  SAB TAB Ectopic Multiple Live Births               # Outcome Date GA Lbr Len/2nd Weight Sex Delivery Anes PTL Lv  4 Gravida           3 Gravida           2 Gravida           1 Gravida             Obstetric Comments  Age first pregnancy 89    Family History: Family History  Problem Relation Age of Onset  . Heart disease  Mother   . Hypertension Mother   . COPD Mother        was a smoker  . Thyroid disease Mother   . Hypertension Father   . Hypertension Sister   . Thyroid disease Sister   . Breast cancer Sister   . Hypertension Sister   . Thyroid disease Sister   . Bone cancer Sister   . Breast cancer Maternal Aunt   . Kidney disease Neg Hx   . Bladder Cancer Neg Hx     Social History: Social History   Socioeconomic History  . Marital status: Divorced    Spouse name: Not on file  . Number of children: 4  . Years of education: 73  . Highest education level: High school graduate  Occupational History  . Occupation: Retired- factory work    Comment: exposed to Pharmacist, community  Tobacco Use  . Smoking status: Former Smoker    Packs/day: 0.30    Years: 2.00    Pack years: 0.60    Types: Cigarettes    Quit date: 06/23/1990    Years since quitting: 29.3  . Smokeless tobacco: Never Used  Substance and Sexual Activity  . Alcohol use: No  . Drug use: No  . Sexual activity: Not Currently  Other Topics Concern  . Not on file  Social History Narrative   Lives in [place] with daughter. Has 4 children. No pets.      Work - Restaurant manager, fast food, retired      Diet - regular      02/14/2019: reports has Meals On Wheels delivered to her home;       Remote hx of smoking > 30 years; No alcohol-    Social Determinants of Health   Financial Resource Strain:   . Difficulty of Paying Living Expenses:   Food Insecurity:   . Worried About Charity fundraiser in the Last Year:   . Arboriculturist in the Last Year:   Transportation Needs: Unmet Transportation Needs  . Lack of Transportation (Medical): Yes  . Lack of Transportation (Non-Medical): No  Physical Activity:   . Days of Exercise per Week:   . Minutes of Exercise per Session:   Stress:   . Feeling of Stress :  Social Connections:   . Frequency of Communication with Friends and Family:   . Frequency of Social Gatherings with Friends and Family:    . Attends Religious Services:   . Active Member of Clubs or Organizations:   . Attends Archivist Meetings:   [name] Marital Status:   Intimate Partner Violence:   . Fear of Current or Ex-Partner:   . Emotionally Abused:   [name] Physically Abused:   . Sexually Abused:     Allergies: Allergies  Allergen Reactions  . Aspirin Hives and Other (See Comments)    Pt states that she is unable to take because she had bleeding in her brain  . Other Other (See Comments)    Pt states that she is unable to take blood thinners because she had bleeding in her brain; Blood Thinners-per doctor at [facility]  . Penicillins Hives, Shortness Of Breath, Swelling and Other (See Comments)    Has patient had a PCN reaction causing immediate rash, facial/tongue/throat swelling, SOB or lightheadedness with hypotension: Yes Has patient had a PCN reaction causing severe rash involving mucus membranes or skin necrosis: No Has patient had a PCN reaction that required hospitalization No Has patient had a PCN reaction occurring within the last 10 years: No If all of the above answers are "NO", then may proceed with Cephalosporin use.    Current Medications: Current Outpatient Medications  Medication Sig Dispense Refill  . Accu-Chek Softclix Lancets lancets TEST UP TO FOUR TIMES DAILY AS DIRECTED 400 each 0  . acetaminophen (TYLENOL) 325 MG tablet Take 325-650 mg by mouth every 6 (six) hours as needed for mild pain. Reported on 10/10/2015    . albuterol (PROVENTIL) (2.5 MG/3ML) 0.083% nebulizer solution Take 3 mLs (2.5 mg total) by nebulization every 6 (six) hours. 75 mL 12  . apixaban (ELIQUIS) 5 MG TABS tablet Take 1 tablet (5 mg total) by mouth 2 (two) times daily. Please call for office visit [phone] 60 tablet 0  . Blood Glucose Monitoring Suppl (ACCU-CHEK AVIVA PLUS) w/Device KIT USE AS DIRECTED 1 kit 0  . digoxin (LANOXIN) 0.125 MG tablet Take 1 tablet by mouth once daily 90 tablet 3  . Dulaglutide  ([name]) 3.47 QQ/5.9DG SOPN INJECT 0.75MG SUBCUTANEOUSLY ONCE A WEEK 4 pen 2  . gabapentin (NEURONTIN) 100 MG capsule TAKE 2 CAPSULES BY MOUTH THREE TIMES DAILY 180 capsule 1  . glucose blood (TRUE METRIX BLOOD GLUCOSE TEST) test strip Check blood glucose twice daily Dx:E11.8, Z79.4 100 each 12  . meclizine (ANTIVERT) 25 MG tablet Take 1 tablet (25 mg total) by mouth 3 (three) times daily as needed for dizziness. (Patient not taking: Reported on 10/12/2019) 30 tablet 0  . Menthol, Topical Analgesic, (BIOFREEZE EX) Apply 1 application topically as needed (for pain).     . ondansetron (ZOFRAN) 8 MG tablet Take 1 tablet (8 mg total) by mouth every 8 (eight) hours as needed for nausea or vomiting. (Patient not taking: Reported on 10/12/2019) 30 tablet 2  . OPSUMIT 10 MG tablet TAKE 1 TABLET BY MOUTH EVERY DAY 30 tablet 3  . OXYGEN Inhale 4 L into the lungs continuous.    . potassium chloride SA (K-DUR) 20 MEQ tablet Take 1 tablet (20 mEq total) by mouth daily. 90 tablet 3  . rosuvastatin (CRESTOR) 10 MG tablet Take 1 tablet (10 mg total) by mouth daily. 90 tablet 0  . Selexipag 200 MCG TABS Take 2 tablets (400MCG) by mouth TWICE a day    .  sildenafil (REVATIO) 20 MG tablet Take 1 tablet (20 mg total) by mouth 3 (three) times daily. 270 tablet 3  . spironolactone (ALDACTONE) 25 MG tablet TAKE 1 TABLET BY MOUTH AT BEDTIME 30 tablet 3  . sulfamethoxazole-trimethoprim (BACTRIM DS) 800-160 MG tablet Take 1 tablet by mouth 2 (two) times daily. (Patient not taking: Reported on 10/12/2019) 6 tablet 0  . torsemide (DEMADEX) 20 MG tablet TAKE 3 TABLETS BY MOUTH TWICE DAILY 540 tablet 0  . UPTRAVI 800 MCG TABS Take 1,200 mcg by mouth 2 (two) times daily.     No current facility-administered medications for this visit.   Review of Systems  She has the vulvar irritation but thinks the area has healed very well.     Objective:  Physical Examination:  There were no vitals taken for this visit.    ECOG  Performance Status: 1 - Symptomatic but completely ambulatory. She is on supplemental O2 and arrived to clinic in a wheelchair.   Pelvic: EGBUS: scattered vulvar lesions, irregular shape and size. The biopsy site has completely healed.   Lab Review As her HPI  Radiologic Imaging: As her HPI    Assessment:  Debra Saunders is a 74 y.o. female diagnosed with asymptomatic bilateral ovarian cysts with elevated ROMA score. Elevated creatinine limits utility of HE4 testing. CA125 normal is reassuring as well as short term stability. She has multiple comorbid conditions.  Vulvar lesions, biopsy performed and demonstrated benign findings.    Medical co-morbidities complicating care: Type II diabetes mellitus ([place]); Atrial fibrillation, persistent ([place]); Cardiorenal syndrome (09/2017 Creat rose to 2.7 w/ diuresis-->HD x 2); Chronic combined systolic (congestive) and diastolic (congestive) heart failure (HCC); COPD (chronic obstructive pulmonary disease) ([place]); Hypertension; History of intracranial hemorrhage; prior surgery Plan:   Problem List Items Addressed This Visit    None    Visit Diagnoses    Vulvar lesion    -  Primary     Follow up for ovarian cyst as planned. We recommended repeat ultrasound and exam in 6 months for ovarian cyst.   Vulvar biopsy site completely healed. We reviewed the pathology report.   The patient's diagnosis, an outline of the further diagnostic and laboratory studies which will be required, the recommendation for surgery, and alternatives were discussed with her and her accompanying family members.  All questions were answered to their satisfaction.  I personally saw the patient and performed a substantive portion of this encounter in conjunction with the listed APP as documented above.   Kalen Neidert Gaetana Michaelis, MD

## 2019-10-20 ENCOUNTER — Telehealth: Payer: Self-pay | Admitting: Family Medicine

## 2019-10-20 NOTE — Telephone Encounter (Signed)
   SF 10/20/2019   Name: Debra Saunders   MRN: [id number]   DOB: 03/21/1946   AGE: 74 y.o.   GENDER: female   PCP [name], MD.   Called pt regarding Community Resource Referral for transportation and in home health services. Informed patient that she can use [employer] in [facility]. The cost would be $10 roundtrip. Patient stated that she already uses [employer] for transportation services. Patient also stated that she has appointments at the [facility] often and was wondering if the [place] still provides transportation for patients. Informed patient that Care Guide would check to see if the service is still available. Patient stated that she does not like [facility] because they are not reliable. Also informed patient about [facility] in [place]. This organization can provided in home health assistance. Patient will need to call organization to initiate services. Patient asked if letter can be sent with a list of information that was provided today. Care Guide will send letter to patient.    [place], Care Management Phone: [phone] Email: [email or web address]

## 2019-10-23 IMAGING — CR DG CHEST 2V
1 series · 2 of 2 positions shown · non-contrast
Comparison: Portable chest x-ray August 25, 2017

CLINICAL DATA: Shortness of breath and weight gain of 17 pounds
over the past several weeks. Suspect CHF exacerbation. History of
atrial fibrillation, CHF, COPD, former smoker.

EXAM:
CHEST - 2 VIEW

[Series 1: dg chest 2 view · 0.14mm/px · 2 of 2 slices shown]
[im 1/2]
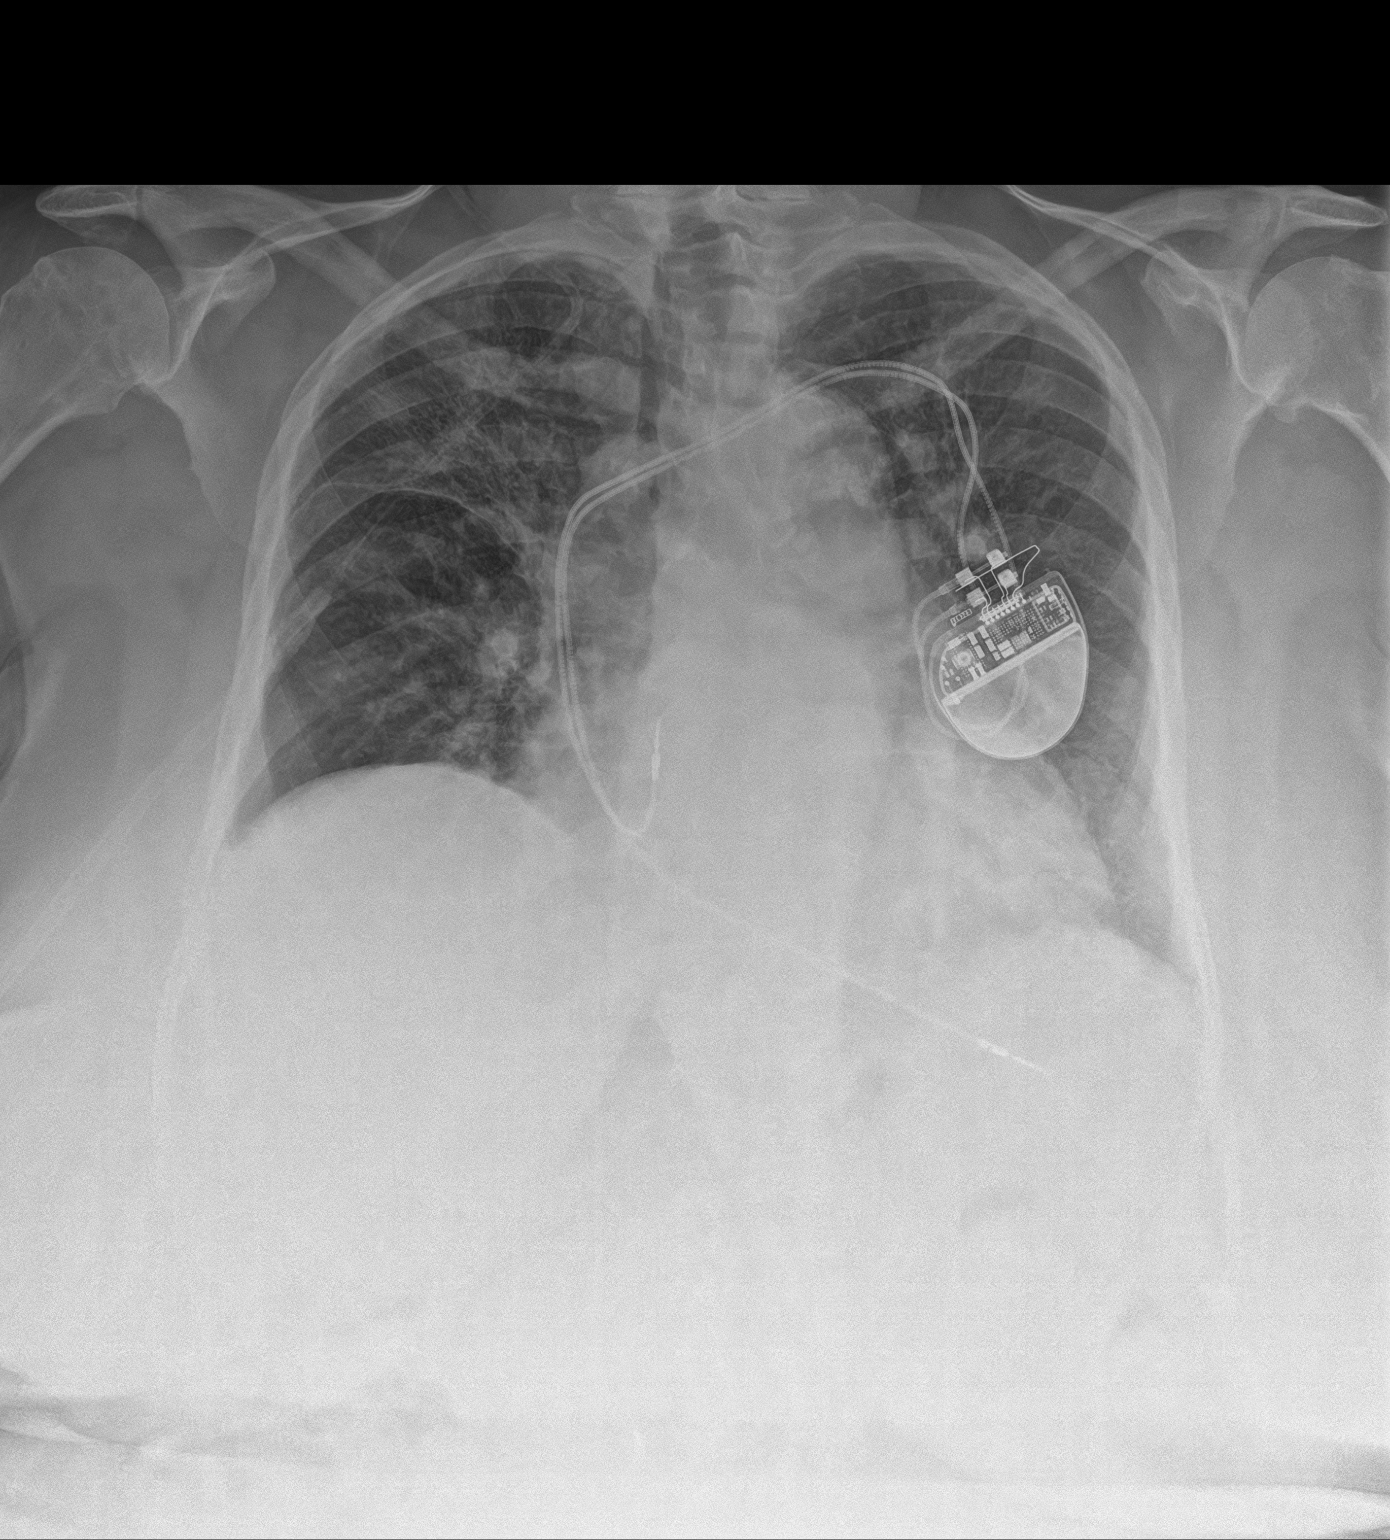
[im 2/2]
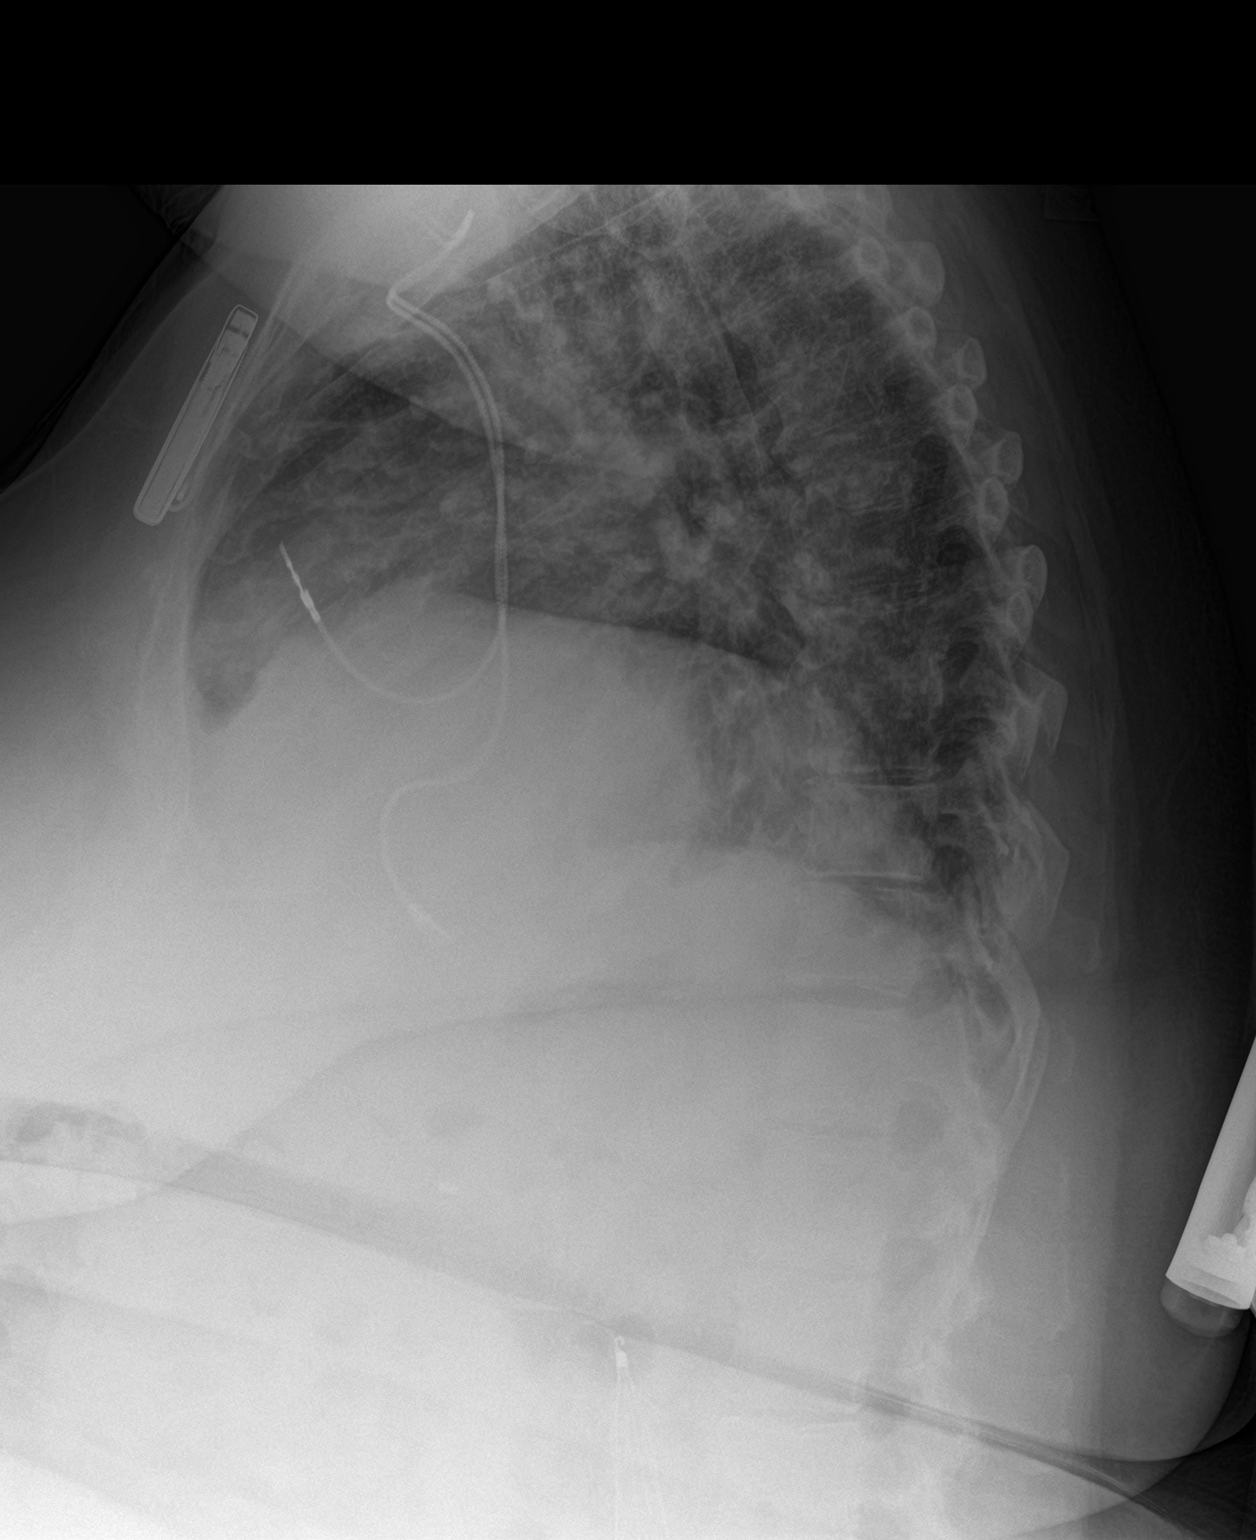

[2 of 2 positions shown; findings below may reference images not displayed]

FINDINGS: The right hemidiaphragm remains higher than the left. The right lung
is hypoinflated. The left lung is better inflated. The interstitial
markings are increased bilaterally with patchy areas of airspace
opacity. The cardiac silhouette is enlarged. The pulmonary
vascularity is engorged and indistinct. There are calcifications in
the wall of the aortic arch. The ICD is in stable position. There is
no significant pleural effusion.
IMPRESSION: Increased interstitial markings bilaterally consistent with
interstitial and early alveolar edema. Certainly pneumonia is not
entirely excluded. No significant pleural effusion.

Thoracic aortic atherosclerosis.

## 2019-10-25 ENCOUNTER — Encounter: Payer: Self-pay | Admitting: Family Medicine

## 2019-10-25 NOTE — Telephone Encounter (Signed)
   SF 10/25/2019   Name: Debra Saunders   MRN: [id number]   DOB: [birth year]   AGE: 74 y.o.   GENDER: female   PCP [name], MD.   Spoke with patient regarding transportation for [facility]. Informed patient that the [name] provides transportation, but they have people who volunteer to provide transportation. Patient will need to let the [name] know the next time she goes in the office for an appointment. Care Guide also let patient know about [facility]. Patient stated that she is aware of their transportation services and she knows she can only have 3 rides per year. [name] stated that she saves [facility] Transportation for when she has an appointment in [place]. Patient was thankful for the information and stated that she has no additional needs at this time.   Closing referral pending any other needs of patient.    [name], Care Management Phone: [phone] Email: [email or web address]

## 2019-10-27 ENCOUNTER — Encounter (HOSPITAL_COMMUNITY): Payer: Self-pay

## 2019-10-27 ENCOUNTER — Other Ambulatory Visit (HOSPITAL_COMMUNITY): Payer: Self-pay

## 2019-10-27 NOTE — Progress Notes (Signed)
Today had a home visit with Debra Saunders.  She states has been doing good.  She states gained a few pounds but was back down today.  She has no more swelling than her normal.  She denies any increase in shortness of breath.  Abdomen does not feel full.  She watches what she eats.  She states drinking enough fluids now.  She has all her medications and filled her med boxes for her.  Called in spironolactone, uptravi and sildinafil.  She is aware that I called them in.  She denies any chest pain today, dizziness or headaches.  Talked for awhile about her recent beach trip and family.  She is aware of her appts.  Will continue to visit for heart failure and medication compliance.   [place] [phone]

## 2019-10-28 ENCOUNTER — Ambulatory Visit: Payer: Medicare HMO | Admitting: Family Medicine

## 2019-10-28 ENCOUNTER — Ambulatory Visit
Admission: RE | Admit: 2019-10-28 | Discharge: 2019-10-28 | Disposition: A | Discharge status: Home / Self Care | Payer: Medicare HMO | Source: Ambulatory Visit | Attending: Family Medicine | Admitting: Family Medicine

## 2019-10-28 DIAGNOSIS — R928 Other abnormal and inconclusive findings on diagnostic imaging of breast: Secondary | ICD-10-CM

## 2019-10-28 DIAGNOSIS — R2231 Localized swelling, mass and lump, right upper limb: Secondary | ICD-10-CM

## 2019-10-28 DIAGNOSIS — R590 Localized enlarged lymph nodes: Secondary | ICD-10-CM | POA: Diagnosis not present

## 2019-10-28 HISTORY — PX: BREAST BIOPSY: SHX20

## 2019-10-31 IMAGING — DX DG CHEST 1V PORT
1 series · 1 of 1 positions shown · non-contrast
Comparison: Chest x-ray dated 09/30/2017.

CLINICAL DATA: SOB. Hx of A-fib, CHF, COPD, HTN, stroke, diabetes,
pacemaker insertion. Former smoker.

EXAM:
PORTABLE CHEST 1 VIEW

[chest ap]
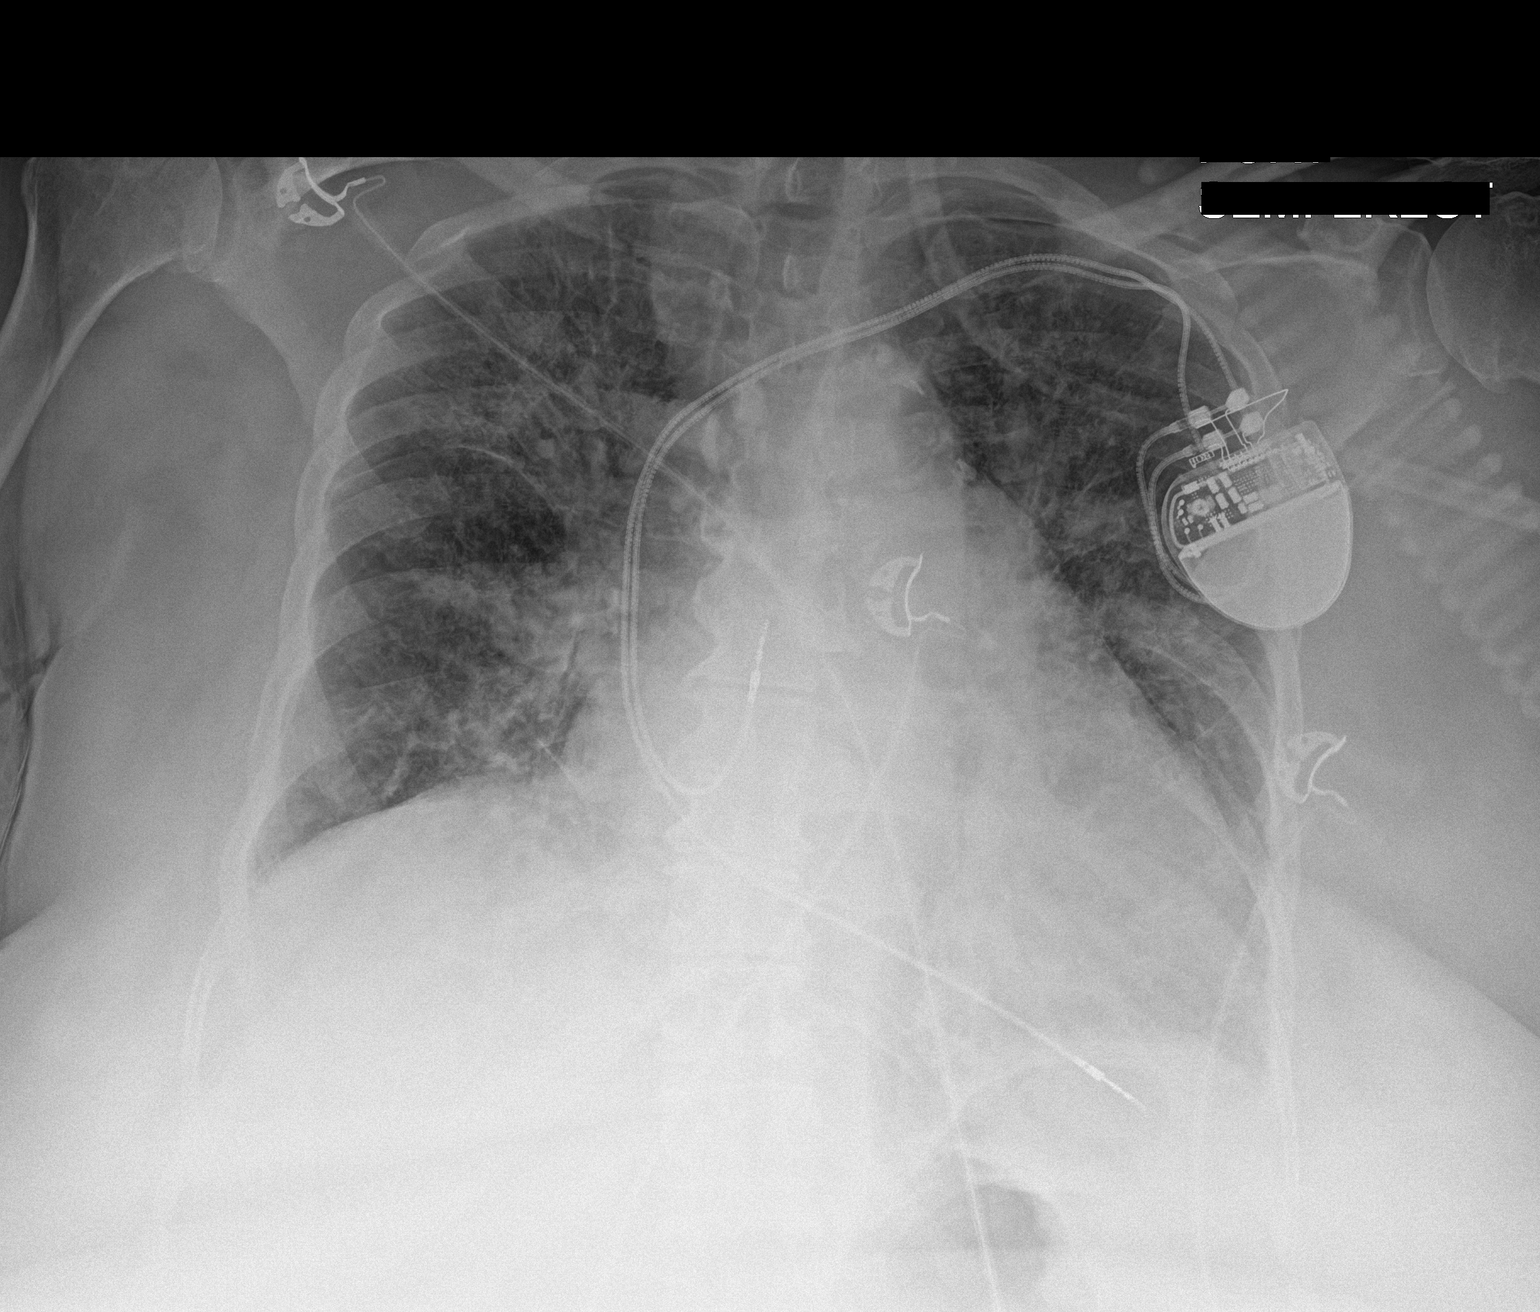

[1 of 1 positions shown; findings below may reference images not displayed]

FINDINGS: Stable cardiomegaly. LEFT chest wall pacemaker/ICD apparatus appears
stable.

Bilateral interstitial edema pattern, perhaps slightly worsened
compared to the previous study. No pleural effusion or pneumothorax
seen. Stable elevation of the RIGHT hemidiaphragm.
IMPRESSION: Cardiomegaly with bilateral interstitial edema, consistent with
CHF/volume overload, perhaps slightly worsened compared to the
previous chest x-ray of 09/30/2017.

## 2019-11-04 ENCOUNTER — Other Ambulatory Visit: Payer: Self-pay

## 2019-11-04 ENCOUNTER — Encounter: Payer: Self-pay | Admitting: Family Medicine

## 2019-11-04 ENCOUNTER — Ambulatory Visit (INDEPENDENT_AMBULATORY_CARE_PROVIDER_SITE_OTHER): Payer: Medicare HMO | Admitting: Family Medicine

## 2019-11-04 VITALS — BP 122/58 | HR 70 | Temp 98.6°F | Ht 60.71 in | Wt 181.0 lb

## 2019-11-04 DIAGNOSIS — E1122 Type 2 diabetes mellitus with diabetic chronic kidney disease: Secondary | ICD-10-CM | POA: Diagnosis not present

## 2019-11-04 DIAGNOSIS — R42 Dizziness and giddiness: Secondary | ICD-10-CM

## 2019-11-04 DIAGNOSIS — K529 Noninfective gastroenteritis and colitis, unspecified: Secondary | ICD-10-CM | POA: Insufficient documentation

## 2019-11-04 DIAGNOSIS — R300 Dysuria: Secondary | ICD-10-CM | POA: Diagnosis not present

## 2019-11-04 LAB — POCT URINALYSIS DIPSTICK
Bilirubin, UA: NEGATIVE
Blood, UA: NEGATIVE
Glucose, UA: NEGATIVE
Ketones, UA: NEGATIVE
Leukocytes, UA: NEGATIVE
Nitrite, UA: NEGATIVE
Protein, UA: NEGATIVE
Spec Grav, UA: 1.02 (ref 1.010–1.025)
Urobilinogen, UA: 0.2 E.U./dL
pH, UA: 5.5 (ref 5.0–8.0)

## 2019-11-04 LAB — POCT GLYCOSYLATED HEMOGLOBIN (HGB A1C): Hemoglobin A1C: 5.2 % (ref 4.0–5.6)

## 2019-11-04 MED ORDER — TRUE METRIX BLOOD GLUCOSE TEST VI STRP: ORAL_STRIP | 12 refills | Status: DC

## 2019-11-04 MED ORDER — MECLIZINE HCL 25 MG PO TABS: 25.0000 mg | ORAL_TABLET | Freq: Three times a day (TID) | ORAL | 0 refills | Status: DC | PRN

## 2019-11-04 MED ORDER — DICYCLOMINE HCL 20 MG PO TABS: 20.0000 mg | ORAL_TABLET | Freq: Four times a day (QID) | ORAL | 0 refills | Status: DC | PRN

## 2019-11-04 NOTE — Patient Instructions (Addendum)
Nice to see you. We will send you to ENT for your dizziness. You can continue the meclizine as needed as prescribed. We will try dicyclomine for your diarrhea.  If you continue to have diarrhea please let us know.  If it is beneficial please let us know.

## 2019-11-04 NOTE — Assessment & Plan Note (Addendum)
Occurring at times when she is sitting down and when she gets up.  Refer to ENT for further evaluation.  Continue as needed meclizine.

## 2019-11-04 NOTE — Assessment & Plan Note (Signed)
Chronic issue.  She will trial dicyclomine.  If not beneficial we will have her follow-up with her GI physician sooner than scheduled.

## 2019-11-04 NOTE — Progress Notes (Signed)
[name], MD Phone: [phone]  Debra Saunders is a 74 y.o. female who presents today for f/u.  Dizziness/lightheadedness: Patient notes this continues to be an issue.  It did not get any better with the medication changes made at her last visit.  It happens when she is just sitting there.  It is not a spinning sensation.  The meclizine does seem to help.  No syncope.  BPs have been 90-130/50 at times.  No tinnitus.  Diabetes: Typically 100-110.  Taking [name].  No polyuria or polydipsia.  She does have some hypoglycemic symptoms if she does not eat.  She will eat something and those will resolve.  Diarrhea: Patient notes this did not improve with stopping her Metformin.  She did see GI last year for this and had negative stool studies.  They encouraged her to use dicyclomine to see if that would help though patient is not sure if she did take this or not.  Dysuria: This improved for a few days and then started again.  No hematuria.  No vaginal discharge.  Does have urinary urgency.  Occasional incontinence with urgency.  Social History   Tobacco Use  Smoking Status Former Smoker  . Packs/day: 0.30  . Years: 2.00  . Pack years: 0.60  . Types: Cigarettes  . Quit date: 06/23/1990  . Years since quitting: 29.3  Smokeless Tobacco Never Used     ROS see history of present illness  Objective  Physical Exam Vitals:   11/04/19 1401  BP: (!) 122/58  Pulse: 70  Temp: 98.6 F (37 C)  SpO2: 97%    BP Readings from Last 3 Encounters:  11/04/19 (!) 122/58  10/27/19 122/68  10/19/19 (!) 121/49   Wt Readings from Last 3 Encounters:  11/04/19 181 lb (82.1 kg)  10/27/19 182 lb (82.6 kg)  10/19/19 184 lb (83.5 kg)    Physical Exam Constitutional:      General: She is not in acute distress.    Appearance: She is not diaphoretic.  HENT:     Right Ear: Tympanic membrane normal.     Left Ear: Tympanic membrane normal.  Cardiovascular:     Rate and Rhythm: Normal  rate and regular rhythm.     Heart sounds: Normal heart sounds.  Pulmonary:     Effort: Pulmonary effort is normal.     Breath sounds: Normal breath sounds.  Musculoskeletal:     Right lower leg: No edema.     Left lower leg: Edema (Chronic left leg edema) present.  Skin:    General: Skin is warm and dry.  Neurological:     Mental Status: She is alert.    Diabetic Foot Exam - Simple   Simple Foot Form Diabetic Foot exam was performed with the following findings: Yes 11/04/2019  2:34 PM  Visual Inspection See comments: Yes Sensation Testing Intact to touch and monofilament testing bilaterally: Yes Pulse Check Posterior Tibialis and Dorsalis pulse intact bilaterally: Yes Comments Left foot and leg chronically enlarged compared to right, no other deformities, ulcerations, or skin breakdown bilaterally       Assessment/Plan: Please see individual problem list.  Dizziness Occurring at times when she is sitting down and when she gets up.  Refer to ENT for further evaluation.  Continue as needed meclizine.  Dysuria Has had recurrence.  Check urinalysis.  Treat based on results.  Diabetes ([place]) Seems to be adequately controlled based on CBGs.  Foot exam completed.  Check A1c.  Chronic  diarrhea Chronic issue.  She will trial dicyclomine.  If not beneficial we will have her follow-up with her GI physician sooner than scheduled.   Health Maintenance: [facility] is having the patient go to [facility] for consideration of repeat colonoscopy.  Orders Placed This Encounter  Procedures  . Ambulatory referral to ENT    Referral Priority:   Routine    Referral Type:   Consultation    Referral Reason:   Specialty Services Required    Requested Specialty:   Otolaryngology    Number of Visits Requested:   1  . POCT Urinalysis Dipstick  . POCT HgB A1C    Meds ordered this encounter  Medications  . glucose blood (TRUE METRIX BLOOD GLUCOSE TEST) test strip    Sig: Check blood  glucose twice daily Dx:E11.8, Z79.4    Dispense:  100 each    Refill:  12  . meclizine (ANTIVERT) 25 MG tablet    Sig: Take 1 tablet (25 mg total) by mouth 3 (three) times daily as needed for dizziness.    Dispense:  30 tablet    Refill:  0  . dicyclomine (BENTYL) 20 MG tablet    Sig: Take 1 tablet (20 mg total) by mouth every 6 (six) hours as needed (diarrhea, abdominal pain).    Dispense:  20 tablet    Refill:  0    This visit occurred during the SARS-CoV-2 public health emergency.  Safety protocols were in place, including screening questions prior to the visit, additional usage of staff PPE, and extensive cleaning of exam room while observing appropriate contact time as indicated for disinfecting solutions.    [name], MD [place]

## 2019-11-04 NOTE — Assessment & Plan Note (Signed)
Has had recurrence.  Check urinalysis.  Treat based on results.

## 2019-11-04 NOTE — Assessment & Plan Note (Addendum)
Seems to be adequately controlled based on CBGs.  Foot exam completed.  Check A1c.

## 2019-11-05 LAB — URINE CULTURE
MICRO NUMBER:: 10479339
SPECIMEN QUALITY:: ADEQUATE

## 2019-11-08 ENCOUNTER — Other Ambulatory Visit: Payer: Self-pay

## 2019-11-08 NOTE — Patient Outreach (Signed)
[facility]) Care Management  11/08/2019  Debra Saunders February 22, 1946 [id number]   Telephone call to patient for disease management follow up. No answer.  HIPAA compliant voice message left.   Plan: RN CM will attempt patient again in the month of June and send letter.    [name], RN, MSN [place] [facility] Care Management Coordinator Direct Line [phone] Cell [phone] Toll Free: [phone]  Fax: [phone]

## 2019-11-10 ENCOUNTER — Other Ambulatory Visit (HOSPITAL_COMMUNITY): Payer: Self-pay

## 2019-11-10 ENCOUNTER — Other Ambulatory Visit: Payer: Self-pay | Admitting: Family Medicine

## 2019-11-10 ENCOUNTER — Encounter (HOSPITAL_COMMUNITY): Payer: Self-pay

## 2019-11-10 ENCOUNTER — Other Ambulatory Visit (HOSPITAL_COMMUNITY): Payer: Self-pay | Admitting: Cardiology

## 2019-11-10 NOTE — Progress Notes (Signed)
Today had a home visit with Debra Saunders.  She is doing good.  She states she has gained some weight back but she is eating more.  She states new medications is helping her chronic bowel movements.  She watches what she eats and fluid intake.  She has no more extremity swelling than her normal.  She denies any chest pain, headaches, dizziness and shortness of breath.  She has all her medications, verified them and filled her med boxes up for her.  [place] to get her a glucometer to match the test strip they sent her.  Called in refills for eliquis and gabapentin.  Will continue to visit for Heart Failure.   [place] [phone]

## 2019-11-11 ENCOUNTER — Other Ambulatory Visit: Payer: Self-pay

## 2019-11-11 DIAGNOSIS — E1122 Type 2 diabetes mellitus with diabetic chronic kidney disease: Secondary | ICD-10-CM

## 2019-11-11 MED ORDER — TRUE METRIX AIR GLUCOSE METER W/DEVICE KIT: PACK | 0 refills | Status: DC

## 2019-11-14 ENCOUNTER — Ambulatory Visit (INDEPENDENT_AMBULATORY_CARE_PROVIDER_SITE_OTHER): Payer: Medicare HMO | Admitting: Pharmacist

## 2019-11-14 DIAGNOSIS — E1122 Type 2 diabetes mellitus with diabetic chronic kidney disease: Secondary | ICD-10-CM | POA: Diagnosis not present

## 2019-11-14 DIAGNOSIS — I5022 Chronic systolic (congestive) heart failure: Secondary | ICD-10-CM | POA: Diagnosis not present

## 2019-11-14 DIAGNOSIS — I272 Pulmonary hypertension, unspecified: Secondary | ICD-10-CM

## 2019-11-14 MED ORDER — LANCETS MISC: 3 refills | Status: DC

## 2019-11-14 MED ORDER — GLUCOSE BLOOD VI STRP: ORAL_STRIP | 3 refills | Status: DC

## 2019-11-14 MED ORDER — PRODIGY VOICE BLOOD GLUCOSE W/DEVICE KIT: PACK | 3 refills | Status: DC

## 2019-11-14 MED ORDER — EMBRACE TALK GLUCOSE TEST VI STRP: ORAL_STRIP | 12 refills | Status: DC

## 2019-11-14 MED ORDER — PRODIGY TWIST TOP LANCETS 28G MISC: 3 refills | Status: DC

## 2019-11-14 NOTE — Patient Instructions (Addendum)
Debra Saunders,   Here is what we discussed!  1) Call [facility] GI to follow up about the colonoscopy with [facility]. See when you should follow up with [facility], since the diarrhea sounds like the biggest issue at this time.   2) Call the [facility] to schedule follow up with Dr. [name]  3) Call [place] to schedule follow up with Dr. Patsey Berthold   I recommend checking blood sugars periodically to ensure fasting readings remain <130 now that you are off the [name].   Call me if you need anything or have questions!  [name], PharmD, [place]  Visit Information  Goals Addressed            This Visit's Progress     Patient Stated   . "I want to stay healthy" (pt-stated)       CARE PLAN ENTRY (see longtitudinal plan of care for additional care plan information)  Current Barriers:  . Diabetes: controlled; most recent A1c 5.2% o Reports significant fatigue lately. Is due for Venofer infusion next week o Reports continued occasional dizziness, treats w/ meclizine . Current antihyperglycemic regimen: none ([name] d/c d/t well controlled A1c, patient is concerned that she is going to gain weight back with stopping this medication) o APPROVED for [name] assistance through 06/22/20 . Current blood glucose readings: has NOT been checking. Ran out of test strips, and refill was accidentally sent for TruMetrix, rather than Embrace Voice talking glucometer. She received an order of TruMetrix test strips, was told by the tech that they could not order. [facility]; they confirm that they cannot order Embrace Voice. New talking glucometer that is covered is Counsellor.  . Cardiovascular risk reduction- followed extensively by heart failure clinic/paramedicine program; Dr. [name] CHF, Dr. Caryl Comes EP, Dr. Fletcher Anon primary cardiology; o Regular pill box fills from Paramedicine program.  o Current hypertensive/CHF regimen: digoxin 0.125 mg  daily, Opsumit 10 mg daily, Uptravi 1600 mcg BID, Revatio 20 mg TID o Current hyperlipidemia regimen: rosuvastatin 10 mg daily; LDL at goal <70 on last check . Reports continued issues with episodes of diarrhea incontinence. Notes that part of her weight loss is due to being afraid to eat, because she can't make it to the bathroom in time. Often having to have her daughter come over to mop the floor. Today, she notes that the dicyclomine has not been helping, though per her visit w/ EMT last week, it was helping. Notes that she has not heard from [facility] to schedule colonoscopy as recommended by [place] . Notes that Dr. [name] had recommended she follow up with pulmonary. Previously saw Dr. Patsey Berthold  Pharmacist Clinical Goal(s):  [name] Over the next 90 days, patient with work with PharmD and primary care provider to address optimized medication management  Interventions: . Comprehensive medication review performed, medication list updated in electronic medical record . Inter-disciplinary care team collaboration (see longitudinal plan of care) . Encouraged to trial time w/o [name], and monitor weights and blood glucose.  [name] Discussed above concerns w/ glucometer; contacted [employer]. Provided the clinical phone number for patient to call to see if she can return the True Metrix meter and strips, and request an override so that the new test strips will be able to run through insurance (otherwise, will declined d/t being too soon). Patient called [employer] to request this, and they note that a talking glucometer will only be covered if she is legally blind. She is amenable to starting  to use True Metrix meter and strips. Encouraged to check periodically, up to BID, to ensure no loss of glucose control w/ [name] d/c . Patient will call [facility] clinic to schedule f/u with Dr. [name] . Patient will call [facility] to f/u with them about who to contact to schedule colonoscopy w/ [facility] . Patient  will call [place] to schedule f/u with Dr. Patsey Berthold, will let us know if she needs a new referral placed.   Patient Self Care Activities:  . Patient will check blood glucose BID, document, and provide at future appointments . Patient will focus on medication adherence by using pill boxes filled by paramedicine . Patient will report any questions or concerns to provider   Please see past updates related to this goal by clicking on the "Past Updates" button in the selected goal         Patient verbalizes understanding of instructions provided today.   Plan:  - Scheduled f/u call in ~ 8 weeks  [name], PharmD, [place], [place] Pharmacist [place] [phone]

## 2019-11-14 NOTE — Chronic Care Management (AMB) (Signed)
Chronic Care Management   Follow Up Note   11/14/2019 Name: Debra Saunders MRN: [id number] DOB: 22-Oct-1945  Referred by: [name], MD Reason for referral : Chronic Care Management (Medication Management)   REM[place] Saunders is a 74 y.o. year old female who is a primary care patient of [name], [name], MD. The CCM team was consulted for assistance with chronic disease management and care coordination needs.    Contacted patient for medication management review.   Review of patient status, including review of consultants reports, relevant laboratory and other test results, and collaboration with appropriate care team members and the patient's provider was performed as part of comprehensive patient evaluation and provision of chronic care management services.    SDOH (Social Determinants of Health) assessments performed: Yes See Care Plan activities for detailed interventions related to [facility])     Outpatient Encounter Medications as of 11/14/2019  Medication Sig Note  . acetaminophen (TYLENOL) 325 MG tablet Take 325-650 mg by mouth every 6 (six) hours as needed for mild pain. Reported on 10/10/2015   . albuterol (PROVENTIL) (2.5 MG/3ML) 0.083% nebulizer solution Take 3 mLs (2.5 mg total) by nebulization every 6 (six) hours. (Patient taking differently: Take 2.5 mg by nebulization every 6 (six) hours as needed. )   . digoxin (LANOXIN) 0.125 MG tablet Take 1 tablet by mouth once daily   . ELIQUIS 5 MG TABS tablet TAKE 1 TABLET BY MOUTH TWICE DAILY. CALL OFFICE TO SCHEDULE OFFICE VISIT [phone].   [name] gabapentin (NEURONTIN) 100 MG capsule TAKE 2 CAPSULES BY MOUTH THREE TIMES DAILY   . meclizine (ANTIVERT) 25 MG tablet Take 1 tablet (25 mg total) by mouth 3 (three) times daily as needed for dizziness.   . Menthol, Topical Analgesic, (BIOFREEZE EX) Apply 1 application topically as needed (for pain).    . ondansetron (ZOFRAN) 8 MG tablet Take 1 tablet (8 mg total) by mouth every 8  (eight) hours as needed for nausea or vomiting.   . OPSUMIT 10 MG tablet TAKE 1 TABLET BY MOUTH EVERY DAY   . potassium chloride SA (K-DUR) 20 MEQ tablet Take 1 tablet (20 mEq total) by mouth daily.   . rosuvastatin (CRESTOR) 10 MG tablet Take 1 tablet (10 mg total) by mouth daily.   . sildenafil (REVATIO) 20 MG tablet Take 1 tablet (20 mg total) by mouth 3 (three) times daily.   [name] spironolactone (ALDACTONE) 25 MG tablet TAKE 1 TABLET BY MOUTH AT BEDTIME   . torsemide (DEMADEX) 20 MG tablet TAKE 3 TABLETS BY MOUTH TWICE DAILY   . UPTRAVI 800 MCG TABS Take 1,200 mcg by mouth 2 (two) times daily. 09/26/2019: Total of 1600 mcg BID  . [DISCONTINUED] Blood Glucose Monitoring Suppl (TRUE METRIX AIR GLUCOSE METER) w/Device KIT To check Blood Glucose Daily   . [DISCONTINUED] glucose blood (TRUE METRIX BLOOD GLUCOSE TEST) test strip Check blood glucose twice daily Dx:E11.8, Z79.4   . dicyclomine (BENTYL) 20 MG tablet Take 1 tablet (20 mg total) by mouth every 6 (six) hours as needed (diarrhea, abdominal pain).   . Dulaglutide ([name]) 1.28 NO/6.7EH SOPN INJECT 0.75MG SUBCUTANEOUSLY ONCE A WEEK (Patient not taking: Reported on 11/14/2019)   . glucose blood test strip Use with True Metrix glucometer to check sugars BID   . Lancets MISC Use with True Metrix glucometer to check sugars BID   . OXYGEN Inhale 4 L into the lungs continuous.   . [DISCONTINUED] Accu-Chek Softclix Lancets lancets TEST UP TO  FOUR TIMES DAILY AS DIRECTED   . [DISCONTINUED] Blood Glucose Monitoring Suppl (PRODIGY VOICE BLOOD GLUCOSE) w/Device KIT Use to check blood sugars twice daily   . [DISCONTINUED] glucose blood (EMBRACE TALK GLUCOSE TEST) test strip Use to test blood sugars up to BID   . [DISCONTINUED] glucose blood test strip Use with Prodigy Voice glucometer to check blood sugars BID   . [DISCONTINUED] Prodigy Twist Top Lancets 28G MISC Use with Prodigy Voice glucometer to check blood sugars BID   . [DISCONTINUED]  sulfamethoxazole-trimethoprim (BACTRIM DS) 800-160 MG tablet Take 1 tablet by mouth 2 (two) times daily. (Patient not taking: Reported on 11/10/2019)    No facility-administered encounter medications on file as of 11/14/2019.     Objective:   Goals Addressed            This Visit's Progress     Patient Stated   . "I want to stay healthy" (pt-stated)       CARE PLAN ENTRY (see longtitudinal plan of care for additional care plan information)  Current Barriers:  . Diabetes: controlled; most recent A1c 5.2% o Reports significant fatigue lately. Is due for Venofer infusion next week o Reports continued occasional dizziness, treats w/ meclizine . Current antihyperglycemic regimen: none ([name] d/c d/t well controlled A1c, patient is concerned that she is going to gain weight back with stopping this medication) o APPROVED for [name] assistance through 06/22/20 . Current blood glucose readings: has NOT been checking. Ran out of test strips, and refill was accidentally sent for TruMetrix, rather than Embrace Voice talking glucometer. She received an order of TruMetrix test strips, was told by the tech that they could not order. [facility]; they confirm that they cannot order Embrace Voice. New talking glucometer that is covered is Counsellor.  . Cardiovascular risk reduction- followed extensively by heart failure clinic/paramedicine program; Dr. [name] CHF, Dr. Caryl Comes EP, Dr. Fletcher Anon primary cardiology; o Regular pill box fills from Paramedicine program.  o Current hypertensive/CHF regimen: digoxin 0.125 mg daily, Opsumit 10 mg daily, Uptravi 1600 mcg BID, Revatio 20 mg TID o Current hyperlipidemia regimen: rosuvastatin 10 mg daily; LDL at goal <70 on last check . Reports continued issues with episodes of diarrhea incontinence. Notes that part of her weight loss is due to being afraid to eat, because she can't make it to the bathroom in time. Often having to have her daughter come over to  mop the floor. Today, she notes that the dicyclomine has not been helping, though per her visit w/ EMT last week, it was helping. Notes that she has not heard from [facility] to schedule colonoscopy as recommended by [place] . Notes that Dr. [name] had recommended she follow up with pulmonary. Previously saw Dr. Patsey Berthold  Pharmacist Clinical Goal(s):  [name] Over the next 90 days, patient with work with PharmD and primary care provider to address optimized medication management  Interventions: . Comprehensive medication review performed, medication list updated in electronic medical record . Inter-disciplinary care team collaboration (see longitudinal plan of care) . Encouraged to trial time w/o [name], and monitor weights and blood glucose.  [name] Discussed above concerns w/ glucometer; contacted [employer]. Provided the clinical phone number for patient to call to see if she can return the True Metrix meter and strips, and request an override so that the new test strips will be able to run through insurance (otherwise, will declined d/t being too soon). Patient called [employer] to request this, and they note that a  talking glucometer will only be covered if she is legally blind. She is amenable to starting to use True Metrix meter and strips. Encouraged to check periodically, up to BID, to ensure no loss of glucose control w/ [name] d/c . Patient will call [facility] GI to f/u with them about who to contact to schedule colonoscopy w/ [facility] . Patient will call [place] to schedule f/u with Dr. Patsey Berthold, will let us know if she needs a new referral placed.   Patient Self Care Activities:  . Patient will check blood glucose BID, document, and provide at future appointments . Patient will focus on medication adherence by using pill boxes filled by paramedicine . Patient will report any questions or concerns to provider   Please see past updates related to this goal by clicking  on the "Past Updates" button in the selected goal          Plan:  - Scheduled f/u call in ~ 8 weeks  [name], PharmD, [place], [place] Pharmacist [place] [phone]

## 2019-11-23 ENCOUNTER — Other Ambulatory Visit (HOSPITAL_COMMUNITY): Payer: Self-pay

## 2019-11-23 DIAGNOSIS — I270 Primary pulmonary hypertension: Secondary | ICD-10-CM

## 2019-11-23 MED ORDER — OPSUMIT 10 MG PO TABS: 10.0000 mg | ORAL_TABLET | Freq: Every day | ORAL | 5 refills | Status: DC

## 2019-11-24 ENCOUNTER — Telehealth: Payer: Self-pay | Admitting: Family Medicine

## 2019-11-24 NOTE — Telephone Encounter (Signed)
Pt had appt on 11/23/2019, pt cancelled appt.  [facility]

## 2019-11-25 NOTE — Telephone Encounter (Signed)
I called and spoke with the patient and she stated she did not have a ride to the appointment, but she is still having the symptoms.  I informed her to call her daughter and see when she is off work and then Pharmacist, community ENT and reschedule and tell them this is the date she could come.  She understood and will reschedule she stated.  Debra Saunders,cma

## 2019-11-25 NOTE — Telephone Encounter (Signed)
Noted. Can you call the patient and see if there is a reason that she cancelled with ENT? Have her symptoms resolved?

## 2019-11-26 ENCOUNTER — Other Ambulatory Visit (HOSPITAL_COMMUNITY): Payer: Self-pay | Admitting: Cardiology

## 2019-11-26 DIAGNOSIS — E785 Hyperlipidemia, unspecified: Secondary | ICD-10-CM

## 2019-11-29 ENCOUNTER — Other Ambulatory Visit: Payer: Self-pay

## 2019-11-29 ENCOUNTER — Inpatient Hospital Stay: Payer: Medicare HMO | Attending: Internal Medicine

## 2019-11-29 ENCOUNTER — Ambulatory Visit (INDEPENDENT_AMBULATORY_CARE_PROVIDER_SITE_OTHER): Payer: Medicare HMO | Admitting: *Deleted

## 2019-11-29 ENCOUNTER — Inpatient Hospital Stay: Payer: Medicare HMO

## 2019-11-29 ENCOUNTER — Inpatient Hospital Stay (HOSPITAL_BASED_OUTPATIENT_CLINIC_OR_DEPARTMENT_OTHER): Payer: Medicare HMO | Admitting: Internal Medicine

## 2019-11-29 VITALS — BP 111/65 | HR 60

## 2019-11-29 DIAGNOSIS — Z87891 Personal history of nicotine dependence: Secondary | ICD-10-CM | POA: Insufficient documentation

## 2019-11-29 DIAGNOSIS — Z803 Family history of malignant neoplasm of breast: Secondary | ICD-10-CM | POA: Insufficient documentation

## 2019-11-29 DIAGNOSIS — E538 Deficiency of other specified B group vitamins: Secondary | ICD-10-CM

## 2019-11-29 DIAGNOSIS — N183 Chronic kidney disease, stage 3 unspecified: Secondary | ICD-10-CM | POA: Diagnosis not present

## 2019-11-29 DIAGNOSIS — E611 Iron deficiency: Secondary | ICD-10-CM

## 2019-11-29 DIAGNOSIS — D509 Iron deficiency anemia, unspecified: Secondary | ICD-10-CM | POA: Insufficient documentation

## 2019-11-29 DIAGNOSIS — I480 Paroxysmal atrial fibrillation: Secondary | ICD-10-CM

## 2019-11-29 DIAGNOSIS — Z86718 Personal history of other venous thrombosis and embolism: Secondary | ICD-10-CM | POA: Diagnosis not present

## 2019-11-29 DIAGNOSIS — I130 Hypertensive heart and chronic kidney disease with heart failure and stage 1 through stage 4 chronic kidney disease, or unspecified chronic kidney disease: Secondary | ICD-10-CM | POA: Diagnosis not present

## 2019-11-29 DIAGNOSIS — Z8679 Personal history of other diseases of the circulatory system: Secondary | ICD-10-CM | POA: Diagnosis not present

## 2019-11-29 LAB — CBC WITH DIFFERENTIAL/PLATELET
Abs Immature Granulocytes: 0 10*3/uL (ref 0.00–0.07)
Basophils Absolute: 0 10*3/uL (ref 0.0–0.1)
Basophils Relative: 1 %
Eosinophils Absolute: 0.2 10*3/uL (ref 0.0–0.5)
Eosinophils Relative: 5 %
HCT: 29.5 % — ABNORMAL LOW (ref 36.0–46.0)
Hemoglobin: 9.1 g/dL — ABNORMAL LOW (ref 12.0–15.0)
Immature Granulocytes: 0 %
Lymphocytes Relative: 34 %
Lymphs Abs: 1.4 10*3/uL (ref 0.7–4.0)
MCH: 27 pg (ref 26.0–34.0)
MCHC: 30.8 g/dL (ref 30.0–36.0)
MCV: 87.5 fL (ref 80.0–100.0)
Monocytes Absolute: 0.2 10*3/uL (ref 0.1–1.0)
Monocytes Relative: 6 %
Neutro Abs: 2.3 10*3/uL (ref 1.7–7.7)
Neutrophils Relative %: 54 %
Platelets: 191 10*3/uL (ref 150–400)
RBC: 3.37 MIL/uL — ABNORMAL LOW (ref 3.87–5.11)
RDW: 13.6 % (ref 11.5–15.5)
WBC: 4.2 10*3/uL (ref 4.0–10.5)
nRBC: 0 % (ref 0.0–0.2)

## 2019-11-29 LAB — BASIC METABOLIC PANEL
Anion gap: 7 (ref 5–15)
BUN: 28 mg/dL — ABNORMAL HIGH (ref 8–23)
CO2: 30 mmol/L (ref 22–32)
Calcium: 8.8 mg/dL — ABNORMAL LOW (ref 8.9–10.3)
Chloride: 104 mmol/L (ref 98–111)
Creatinine, Ser: 1.19 mg/dL — ABNORMAL HIGH (ref 0.44–1.00)
GFR calc Af Amer: 51 mL/min — ABNORMAL LOW (ref >60)
GFR calc non Af Amer: 44 mL/min — ABNORMAL LOW (ref >60)
Glucose, Bld: 106 mg/dL — ABNORMAL HIGH (ref 70–99)
Potassium: 4.1 mmol/L (ref 3.5–5.1)
Sodium: 141 mmol/L (ref 135–145)

## 2019-11-29 LAB — IRON AND TIBC
Iron: 55 ug/dL (ref 28–170)
Saturation Ratios: 14 % (ref 10.4–31.8)
TIBC: 393 ug/dL (ref 250–450)
UIBC: 338 ug/dL

## 2019-11-29 LAB — FERRITIN: Ferritin: 70 ng/mL (ref 11–307)

## 2019-11-29 LAB — VITAMIN B12: Vitamin B-12: 303 pg/mL (ref 180–914)

## 2019-11-29 MED ORDER — CYANOCOBALAMIN 1000 MCG/ML IJ SOLN
1000.0000 ug | Freq: Once | INTRAMUSCULAR | Status: AC
  Administered 2019-11-29: 1000 ug via INTRAMUSCULAR
  Filled 2019-11-29: qty 1

## 2019-11-29 MED ORDER — IRON SUCROSE 20 MG/ML IV SOLN
200.0000 mg | Freq: Once | INTRAVENOUS | Status: AC
  Administered 2019-11-29: 200 mg via INTRAVENOUS
  Filled 2019-11-29: qty 10

## 2019-11-29 MED ORDER — SODIUM CHLORIDE 0.9 % IV SOLN
Freq: Once | INTRAVENOUS | Status: AC
  Filled 2019-11-29: qty 250

## 2019-11-29 NOTE — Progress Notes (Signed)
Pt in for follow up, reports still very weak.

## 2019-11-29 NOTE — Assessment & Plan Note (Addendum)
#  Anemia-iron deficiency/CKD stage III- currently on Venofer status post 3 treatments hemoglobin today is 9 improved from around 8.  Proceed with Venofer today.  #Etiology-iron deficient is unclear.  Question CKD versus others.  Colonoscopy March 2020-polyps.  Stool occult-negative.  #CKD stage III-GFR 44 stable.  #History of DVT/s/p IVC filter; ?  A. Fib-Eliquis-stable.  #Borderline low B12-230; s/p B12 injection.  Recommend B12 in 3 months.  #Incidental right axillary mass-s/p US biopsy [PCP] -negative for malignancy.  # DISPOSITION:   # venofer/b12 today # Venofer weekly x3 # Follow up in 2 months-MD-labs- cbc;bmp;possible;  Venofer; B12 injection- Dr.B

## 2019-11-29 NOTE — Progress Notes (Signed)
[place] NOTE  Patient Care Team: [name], MD as PCP - General (Family Medicine) Wellington Hampshire, MD as PCP - Cardiology (Cardiology) Deboraha Sprang, MD as PCP - Electrophysiology (Cardiology) Thom Chimes, MD as Referring Physician (Allergy) Juanito Doom, MD as Referring Physician (Internal Medicine) De Hollingshead, [facility] as Pharmacist (Pharmacist) Cammie Sickle, MD as Consulting Physician (Hematology and Oncology) Jon Billings, RN as [place] Management Anemia CHIEF COMPLAINTS/PURPOSE OF CONSULTATION: Anemia   HEMATOLOGY HISTORY  # IRON DEFICIENCY ANEMIA-Jan today 21 PCP Hb-8.8; Antonieta Pert sat-5%; ferritin 11] EGD-; colonoscopy MARCH 2020 -polyp [[facility]-hold further work-up secondary to cardiac issues]; remote-EGD-?;  February 2021-LDH/haptoglobin normal; kappa lambda light chain ratio/M panel normal;  on Venofer; November 2020-CT scan question early cirrhosis/no splenomegaly.  No acute process; MARCH 2021- STOOL OCCULT x3- NEGATIVE.   #B12 deficiency-IM every 3 to 4 months  #2014 -question blood transfusion  [patient admitted for stroke]; CHF chronic -[ 2004 on 4 lits; Dr.Arida/Klein]; CT scan chest 2021 -no adenopathy; fibrotic changes; walker; CKD stage III-   HISTORY OF PRESENTING ILLNESS:  Debra Saunders 74 y.o.  female with iron deficiency unclear etiology is here for follow-up.  In the interim patient underwent biopsy of her right axillary lymph node-found incidentally on mammogram.  Negative for malignancy.  Patient is currently status post IV Venofer; noted to have some improvement of energy levels.  But she continues to feel short of breath especially exertion.   Review of Systems  Constitutional: Positive for malaise/fatigue. Negative for chills, diaphoresis, fever and weight loss.  HENT: Negative for nosebleeds and sore throat.   Eyes: Negative for double vision.  Respiratory: Positive for  shortness of breath. Negative for cough, hemoptysis, sputum production and wheezing.   Cardiovascular: Negative for chest pain, palpitations, orthopnea and leg swelling.  Gastrointestinal: Negative for abdominal pain, blood in stool, constipation, diarrhea, heartburn, melena, nausea and vomiting.  Genitourinary: Negative for dysuria, frequency and urgency.  Musculoskeletal: Negative for back pain and joint pain.  Skin: Negative.  Negative for itching and rash.  Neurological: Negative for dizziness, tingling, focal weakness, weakness and headaches.  Endo/Heme/Allergies: Does not bruise/bleed easily.  Psychiatric/Behavioral: Negative for depression. The patient is not nervous/anxious and does not have insomnia.     MEDICAL HISTORY:  Past Medical History:  Diagnosis Date  . Arthritis   . Atrial fibrillation, persistent ([employer])    a. s/p TEE-guided DCCV on 08/20/2017  . Cardiorenal syndrome    a. 09/2017 Creat rose to 2.7 w/ diuresis-->HD x 2.  . Chest pain    a. 01/2012 Cath: nonobs dzs;  c. 04/2014 MV: No ischemia.  . Chronic back pain   . Chronic combined systolic (congestive) and diastolic (congestive) heart failure ([place])    a. Dx 2005 @ [facility];  b. 02/2014 Echo: EF 55-60%; c. 07/2017: EF 30-35%, diffuse HK, trivial AI, severe MR, moderate TR; d. 09/2017 Echo: EF 40-45%; e. 02/2018 Echo: EF 55-60%, Gr1 DD; f. 08/2019 Echo: EF 60-65%, Gr2 DD, Nl RV fxn, RVSP 49.65mmHg. Mod-Sev MR. Mild to mod TR. Mod PR. RA ~ 87mmHg.  [name] Chronic respiratory failure (HCC)    a. on home O2  . COPD (chronic obstructive pulmonary disease) ([place])    a. Home O2 - 3lpm  . Gastritis   . History of intracranial hemorrhage    a. 6/14 described by neurosurgery as "small frontal posttraumatic SAH " - pt was on xarelto @ the time.  [name]  Hyperlipidemia   . Hypertension   . Interstitial lung disease ([place])   . Lipoma of back   . Lymphedema    a. Chronic LLE edema.  [name] NICM (nonischemic cardiomyopathy) ([place])    a. 09/2017 Echo: EF  40-45%; b. 09/2017 Cath: minor, insignificant CAD; c. 02/2018 Echo: EF 55-60%, Gr1 DD; d. 08/2019 Echo: EF 60-65%, Gr2 DD.  [name] Obesity   . PAH (pulmonary artery hypertension) ([place])    a. 09/2017 Echo: PASP 1mmHg; b. 09/2017 RHC: RA 23, RV 84/13, PA 84/29, PCWP 20, CO 3.89, CI 1.89. LVEDP 18.  . Sepsis with metabolic encephalopathy ([place])   . Sleep apnea   . Streptococcal bacteremia   . Stroke ([place])   . Subarachnoid hemorrhage ([place]) 01/19/2013  . Symptomatic bradycardia    a. s/p BSX PPM.  . Thyroid nodule   . Type II diabetes mellitus ([place])   . Urinary incontinence     SURGICAL HISTORY: Past Surgical History:  Procedure Laterality Date  . ABDOMINAL HYSTERECTOMY  1969  . BREAST BIOPSY Right 10/28/2019   lymph node bx, [place] Bx, pending path   . CARDIAC CATHETERIZATION  2013   @ [place]: No obstructive CAD: Only 20% ostial left circumflex.  [name] CARDIOVERSION N/A 10/12/2017   Procedure: CARDIOVERSION;  Surgeon: Minna Merritts, MD;  Location: [facility] ORS;  Service: Cardiovascular;  Laterality: N/A;  . CHOLECYSTECTOMY    . COLONOSCOPY WITH PROPOFOL N/A 09/10/2018   Procedure: COLONOSCOPY WITH PROPOFOL;  Surgeon: Manya Silvas, MD;  Location: [facility] ENDOSCOPY;  Service: Endoscopy;  Laterality: N/A;  . CYST REMOVAL TRUNK  2002   BACK  . DIALYSIS/PERMA CATHETER INSERTION N/A 10/06/2017   Procedure: DIALYSIS/PERMA CATHETER INSERTION;  Surgeon: Katha Cabal, MD;  Location: [place] CV LAB;  Service: Cardiovascular;  Laterality: N/A;  . INSERT / REPLACE / REMOVE PACEMAKER    . lipoma removal  2014   back  . PACEMAKER INSERTION  [address] [employer] dual chamber pacemaker implanted by Dr Caryl Comes for symptomatic bradycardia  . PERMANENT PACEMAKER INSERTION N/A 01/09/2014   Procedure: PERMANENT PACEMAKER INSERTION;  Surgeon: Deboraha Sprang, MD;  Location: [facility] CATH LAB;  Service: Cardiovascular;  Laterality: N/A;  . RIGHT HEART CATH N/A 04/19/2018   Procedure: RIGHT HEART CATH;  Surgeon: Larey Dresser, MD;  Location: [place] CV LAB;  Service: Cardiovascular;  Laterality: N/A;  . RIGHT/LEFT HEART CATH AND CORONARY ANGIOGRAPHY N/A 10/19/2017   Procedure: RIGHT/LEFT HEART CATH AND CORONARY ANGIOGRAPHY;  Surgeon: Wellington Hampshire, MD;  Location: [place] CV LAB;  Service: Cardiovascular;  Laterality: N/A;  . TEE WITHOUT CARDIOVERSION N/A 08/20/2017   Procedure: TRANSESOPHAGEAL ECHOCARDIOGRAM (TEE) & Direct current cardioversion;  Surgeon: Minna Merritts, MD;  Location: [facility] ORS;  Service: Cardiovascular;  Laterality: N/A;  . TRIGGER FINGER RELEASE      SOCIAL HISTORY: Social History   Socioeconomic History  . Marital status: Divorced    Spouse name: Not on file  . Number of children: 4  . Years of education: 74  . Highest education level: High school graduate  Occupational History  . Occupation: Retired- factory work    Comment: exposed to Pharmacist, community  Tobacco Use  . Smoking status: Former Smoker    Packs/day: 0.30    Years: 2.00    Pack years: 0.60    Types: Cigarettes    Quit date: 06/23/1990    Years since quitting: 29.4  . Smokeless tobacco: Never Used  Substance  and Sexual Activity  . Alcohol use: No  . Drug use: No  . Sexual activity: Not Currently  Other Topics Concern  . Not on file  Social History Narrative   Lives in [place] with daughter. Has 4 children. No pets.      Work - Restaurant manager, fast food, retired      Diet - regular      02/14/2019: reports has Meals On Wheels delivered to her home;       Remote hx of smoking > 30 years; No alcohol-    Social Determinants of Health   Financial Resource Strain:   . Difficulty of Paying Living Expenses:   Food Insecurity:   . Worried About Charity fundraiser in the Last Year:   . Arboriculturist in the Last Year:   Transportation Needs: Unmet Transportation Needs  . Lack of Transportation (Medical): Yes  . Lack of Transportation (Non-Medical): No  Physical Activity:   . Days of Exercise per Week:    . Minutes of Exercise per Session:   Stress:   . Feeling of Stress :   Social Connections:   . Frequency of Communication with Friends and Family:   . Frequency of Social Gatherings with Friends and Family:   . Attends Religious Services:   . Active Member of Clubs or Organizations:   . Attends Archivist Meetings:   [name] Marital Status:   Intimate Partner Violence:   . Fear of Current or Ex-Partner:   . Emotionally Abused:   [name] Physically Abused:   . Sexually Abused:     FAMILY HISTORY: Family History  Problem Relation Age of Onset  . Heart disease Mother   . Hypertension Mother   . COPD Mother        was a smoker  . Thyroid disease Mother   . Hypertension Father   . Hypertension Sister   . Thyroid disease Sister   . Breast cancer Sister   . Hypertension Sister   . Thyroid disease Sister   . Bone cancer Sister   . Breast cancer Maternal Aunt   . Kidney disease Neg Hx   . Bladder Cancer Neg Hx     ALLERGIES:  is allergic to aspirin; other; and penicillins.  MEDICATIONS:  Current Outpatient Medications  Medication Sig Dispense Refill  . acetaminophen (TYLENOL) 325 MG tablet Take 325-650 mg by mouth every 6 (six) hours as needed for mild pain. Reported on 10/10/2015    . albuterol (PROVENTIL) (2.5 MG/3ML) 0.083% nebulizer solution Take 3 mLs (2.5 mg total) by nebulization every 6 (six) hours. (Patient taking differently: Take 2.5 mg by nebulization every 6 (six) hours as needed. ) 75 mL 12  . dicyclomine (BENTYL) 20 MG tablet Take 1 tablet (20 mg total) by mouth every 6 (six) hours as needed (diarrhea, abdominal pain). 20 tablet 0  . digoxin (LANOXIN) 0.125 MG tablet Take 1 tablet by mouth once daily 90 tablet 3  . ELIQUIS 5 MG TABS tablet TAKE 1 TABLET BY MOUTH TWICE DAILY. CALL OFFICE TO SCHEDULE OFFICE VISIT [phone]. 60 tablet 0  . gabapentin (NEURONTIN) 100 MG capsule TAKE 2 CAPSULES BY MOUTH THREE TIMES DAILY 180 capsule 0  . glucose blood test strip Use  with True Metrix glucometer to check sugars BID 200 each 3  . Lancets MISC Use with True Metrix glucometer to check sugars BID 200 each 3  . macitentan (OPSUMIT) 10 MG tablet Take 1 tablet (10 mg total) by  mouth daily. 30 tablet 5  . meclizine (ANTIVERT) 25 MG tablet Take 1 tablet (25 mg total) by mouth 3 (three) times daily as needed for dizziness. 30 tablet 0  . Menthol, Topical Analgesic, (BIOFREEZE EX) Apply 1 application topically as needed (for pain).     . ondansetron (ZOFRAN) 8 MG tablet Take 1 tablet (8 mg total) by mouth every 8 (eight) hours as needed for nausea or vomiting. 30 tablet 2  . OXYGEN Inhale 4 L into the lungs continuous.    . potassium chloride SA (K-DUR) 20 MEQ tablet Take 1 tablet (20 mEq total) by mouth daily. 90 tablet 3  . rosuvastatin (CRESTOR) 10 MG tablet Take 1 tablet by mouth once daily 90 tablet 1  . sildenafil (REVATIO) 20 MG tablet Take 1 tablet (20 mg total) by mouth 3 (three) times daily. 270 tablet 3  . spironolactone (ALDACTONE) 25 MG tablet TAKE 1 TABLET BY MOUTH AT BEDTIME 30 tablet 3  . torsemide (DEMADEX) 20 MG tablet TAKE 3 TABLETS BY MOUTH TWICE DAILY 540 tablet 0  . UPTRAVI 800 MCG TABS Take 1,200 mcg by mouth 2 (two) times daily.     No current facility-administered medications for this visit.      PHYSICAL EXAMINATION:   Vitals:   11/29/19 1311  BP: 101/61  Pulse: 60  Resp: 20  Temp: 97.6 F (36.4 C)  SpO2: 100%   Filed Weights   11/29/19 1311  Weight: 181 lb 11.2 oz (82.4 kg)    Physical Exam  Constitutional: She is oriented to person, place, and time and well-developed, well-nourished, and in no distress.  Patient in wheelchair.  Alone.  She is on O2 nasal cannula.  HENT:  Head: Normocephalic and atraumatic.  Mouth/Throat: Oropharynx is clear and moist. No oropharyngeal exudate.  Eyes: Pupils are equal, round, and reactive to light.  Cardiovascular: Normal rate and regular rhythm.  Pulmonary/Chest: No respiratory  distress. She has no wheezes.  Decreased air entry bilaterally.  Abdominal: Soft. Bowel sounds are normal. She exhibits no distension and no mass. There is no abdominal tenderness. There is no rebound and no guarding.  Musculoskeletal:        General: No tenderness or edema. Normal range of motion.     Cervical back: Normal range of motion and neck supple.  Neurological: She is alert and oriented to person, place, and time.  Skin: Skin is warm.  Psychiatric: Affect normal.    LABORATORY DATA:  I have reviewed the data as listed Lab Results  Component Value Date   WBC 4.2 11/29/2019   HGB 9.1 (L) 11/29/2019   HCT 29.5 (L) 11/29/2019   MCV 87.5 11/29/2019   PLT 191 11/29/2019   Recent Labs    01/20/19 1121 01/20/19 1121 02/03/19 1413 02/03/19 1413 07/08/19 1504 08/01/19 1524 11/29/19 1244  NA 137   < > 138   < > 139 137 141  K 3.7   < > 4.1   < > 4.4 3.8 4.1  CL 102   < > 99   < > 101 99 104  CO2 24   < > 30   < > 28 28 30   GLUCOSE 128*   < > 114*   < > 102* 94 106*  BUN 13   < > 26*   < > 26* 24* 28*  CREATININE 1.28*   < > 1.39*  --  1.39* 1.16* 1.19*  CALCIUM 9.1   < > 9.7   < >  9.4 9.3 8.8*  GFRNONAA 41*  --   --   --   --  46* 44*  GFRAA 47*  --   --   --   --  53* 51*  PROT  --   --  7.6  --  7.4 8.1  --   ALBUMIN  --   --  4.1  --   --  4.0  --   AST  --   --  15  --  11 15  --   ALT  --   --  7  --  5* 10  --   ALKPHOS  --   --  111  --   --  124  --   BILITOT  --   --  0.4  --  0.4 0.5  --   BILIDIR  --   --  0.1  --   --   --   --    < > = values in this interval not displayed.     No results found.  Iron deficiency #Anemia-iron deficiency/CKD stage III- currently on Venofer status post 3 treatments hemoglobin today is 9 improved from around 8.  Proceed with Venofer today.  #Etiology-iron deficient is unclear.  Question CKD versus others.  Colonoscopy March 2020-polyps.  Stool occult-negative.  #CKD stage III-GFR 44 stable.  #History of DVT/s/p IVC  filter; ?  A. Fib-Eliquis-stable.  #Borderline low B12-230; s/p B12 injection.  Recommend B12 in 3 months.  #Incidental right axillary mass-s/p US biopsy [PCP] -negative for malignancy.  # DISPOSITION:   # venofer/b12 today # Venofer weekly x3 # Follow up in 2 months-MD-labs- cbc;bmp;possible;  Venofer; B12 injection- Dr.B  All questions were answered. The patient knows to call the clinic with any problems, questions or concerns.    Cammie Sickle, MD 11/29/2019 1:36 PM

## 2019-11-30 LAB — CUP PACEART REMOTE DEVICE CHECK
Battery Remaining Longevity: 84 mo
Battery Remaining Percentage: 91 %
Brady Statistic RA Percent Paced: 74 %
Brady Statistic RV Percent Paced: 98 %
Date Time Interrogation Session: 20210609082400
Implantable Lead Implant Date: 20150720
Implantable Lead Implant Date: 20150720
Implantable Lead Location: 753859
Implantable Lead Location: 753860
Implantable Lead Model: 5076
Implantable Lead Model: 5076
Implantable Pulse Generator Implant Date: 20150720
Lead Channel Impedance Value: 374 Ohm
Lead Channel Impedance Value: 510 Ohm
Lead Channel Pacing Threshold Amplitude: 0.8 V
Lead Channel Pacing Threshold Pulse Width: 0.4 ms
Lead Channel Setting Pacing Amplitude: 2 V
Lead Channel Setting Pacing Amplitude: 2.4 V
Lead Channel Setting Pacing Pulse Width: 0.4 ms
Lead Channel Setting Sensing Sensitivity: 2.5 mV
Pulse Gen Serial Number: 390330

## 2019-11-30 NOTE — Progress Notes (Signed)
Remote pacemaker transmission.   

## 2019-12-01 ENCOUNTER — Encounter (HOSPITAL_COMMUNITY): Payer: Self-pay

## 2019-12-01 ENCOUNTER — Other Ambulatory Visit (HOSPITAL_COMMUNITY): Payer: Self-pay

## 2019-12-01 NOTE — Progress Notes (Signed)
Today had a home visit with Debra Saunders.  She states tired today and still having diarrhea.  She states she can not keep anything in.  She states has had this problems for years.  Her GI doctor is wanting her to see a specialist in [place], she states she will be making an appt with them.  She has all her medications.  It appears by her boxes she is taking all of them.  She states was very busy cleaning yesterday and she wore her self out.  She has not weighed today or eaten.  She has just took her medications.  She denies any chest pain, shortness of breath, headaches or dizziness.  She has no edema in her legs, just her lymphadema in her left foot, which is her normal.  Abdomen is soft.  She is aware of up coming appts.  Will continue to visit for heart failure and medication compliance.   [place] [phone]

## 2019-12-06 ENCOUNTER — Inpatient Hospital Stay: Payer: Medicare HMO

## 2019-12-06 ENCOUNTER — Other Ambulatory Visit: Payer: Self-pay

## 2019-12-06 VITALS — BP 126/67 | HR 60 | Temp 98.5°F

## 2019-12-06 DIAGNOSIS — Z86718 Personal history of other venous thrombosis and embolism: Secondary | ICD-10-CM | POA: Diagnosis not present

## 2019-12-06 DIAGNOSIS — E538 Deficiency of other specified B group vitamins: Secondary | ICD-10-CM | POA: Diagnosis not present

## 2019-12-06 DIAGNOSIS — Z8679 Personal history of other diseases of the circulatory system: Secondary | ICD-10-CM | POA: Diagnosis not present

## 2019-12-06 DIAGNOSIS — Z87891 Personal history of nicotine dependence: Secondary | ICD-10-CM | POA: Diagnosis not present

## 2019-12-06 DIAGNOSIS — N183 Chronic kidney disease, stage 3 unspecified: Secondary | ICD-10-CM | POA: Diagnosis not present

## 2019-12-06 DIAGNOSIS — Z803 Family history of malignant neoplasm of breast: Secondary | ICD-10-CM | POA: Diagnosis not present

## 2019-12-06 DIAGNOSIS — E611 Iron deficiency: Secondary | ICD-10-CM

## 2019-12-06 DIAGNOSIS — D509 Iron deficiency anemia, unspecified: Secondary | ICD-10-CM | POA: Diagnosis not present

## 2019-12-06 DIAGNOSIS — I130 Hypertensive heart and chronic kidney disease with heart failure and stage 1 through stage 4 chronic kidney disease, or unspecified chronic kidney disease: Secondary | ICD-10-CM | POA: Diagnosis not present

## 2019-12-06 MED ORDER — IRON SUCROSE 20 MG/ML IV SOLN
200.0000 mg | Freq: Once | INTRAVENOUS | Status: AC
  Administered 2019-12-06: 200 mg via INTRAVENOUS
  Filled 2019-12-06: qty 10

## 2019-12-06 MED ORDER — SODIUM CHLORIDE 0.9 % IV SOLN
Freq: Once | INTRAVENOUS | Status: AC
  Filled 2019-12-06: qty 250

## 2019-12-09 ENCOUNTER — Ambulatory Visit: Payer: Medicare HMO

## 2019-12-13 ENCOUNTER — Inpatient Hospital Stay: Payer: Medicare HMO

## 2019-12-13 ENCOUNTER — Other Ambulatory Visit: Payer: Self-pay

## 2019-12-13 VITALS — BP 114/60 | HR 60 | Temp 97.1°F | Resp 18

## 2019-12-13 DIAGNOSIS — Z803 Family history of malignant neoplasm of breast: Secondary | ICD-10-CM | POA: Diagnosis not present

## 2019-12-13 DIAGNOSIS — E611 Iron deficiency: Secondary | ICD-10-CM

## 2019-12-13 DIAGNOSIS — Z8679 Personal history of other diseases of the circulatory system: Secondary | ICD-10-CM | POA: Diagnosis not present

## 2019-12-13 DIAGNOSIS — D509 Iron deficiency anemia, unspecified: Secondary | ICD-10-CM | POA: Diagnosis not present

## 2019-12-13 DIAGNOSIS — Z87891 Personal history of nicotine dependence: Secondary | ICD-10-CM | POA: Diagnosis not present

## 2019-12-13 DIAGNOSIS — E538 Deficiency of other specified B group vitamins: Secondary | ICD-10-CM

## 2019-12-13 DIAGNOSIS — N183 Chronic kidney disease, stage 3 unspecified: Secondary | ICD-10-CM | POA: Diagnosis not present

## 2019-12-13 DIAGNOSIS — I130 Hypertensive heart and chronic kidney disease with heart failure and stage 1 through stage 4 chronic kidney disease, or unspecified chronic kidney disease: Secondary | ICD-10-CM | POA: Diagnosis not present

## 2019-12-13 DIAGNOSIS — Z86718 Personal history of other venous thrombosis and embolism: Secondary | ICD-10-CM | POA: Diagnosis not present

## 2019-12-13 MED ORDER — IRON SUCROSE 20 MG/ML IV SOLN
200.0000 mg | Freq: Once | INTRAVENOUS | Status: AC
  Administered 2019-12-13: 200 mg via INTRAVENOUS
  Filled 2019-12-13: qty 10

## 2019-12-13 MED ORDER — SODIUM CHLORIDE 0.9 % IV SOLN
Freq: Once | INTRAVENOUS | Status: AC
  Filled 2019-12-13: qty 250

## 2019-12-13 NOTE — Patient Outreach (Signed)
[facility]) Care Management  12/13/2019  Debra Saunders 11-Sep-1945 [id number]   Telephone call to patient for disease management follow up.  No answer.  HIPAA compliant voice message left.  Plan: RN CM will attempt patient again in the month of August.    [name] [name], RN, MSN [place] [facility] Care Management Coordinator Direct Line [phone] Cell [phone] Toll Free: [phone]  Fax: [phone]

## 2019-12-15 ENCOUNTER — Ambulatory Visit (INDEPENDENT_AMBULATORY_CARE_PROVIDER_SITE_OTHER): Payer: Medicare HMO

## 2019-12-15 VITALS — BP 143/71 | HR 58 | Ht 60.0 in | Wt 179.5 lb

## 2019-12-15 DIAGNOSIS — Z0000 Encounter for general adult medical examination without abnormal findings: Secondary | ICD-10-CM | POA: Diagnosis not present

## 2019-12-15 NOTE — Patient Instructions (Addendum)
[name] , Thank you for taking time to come for your Medicare Wellness Visit. I appreciate your ongoing commitment to your health goals. Please review the following plan we discussed and let me know if I can assist you in the future.   These are the goals we discussed: Goals      Patient Stated   .  "I want to stay healthy" (pt-stated)      CARE PLAN ENTRY (see longtitudinal plan of care for additional care plan information)  Current Barriers:  . Diabetes: controlled; most recent A1c 5.2% o Reports significant fatigue lately. Is due for Venofer infusion next week o Reports continued occasional dizziness, treats w/ meclizine . Current antihyperglycemic regimen: none ([name] d/c d/t well controlled A1c, patient is concerned that she is going to gain weight back with stopping this medication) o APPROVED for [name] assistance through 06/22/20 . Current blood glucose readings: has NOT been checking. Ran out of test strips, and refill was accidentally sent for TruMetrix, rather than Embrace Voice talking glucometer. She received an order of TruMetrix test strips, was told by the tech that they could not order. [facility]; they confirm that they cannot order Embrace Voice. New talking glucometer that is covered is [employer].  . Cardiovascular risk reduction- followed extensively by heart failure clinic/paramedicine program; Dr. [name] CHF, Dr. Caryl Comes EP, Dr. Fletcher Anon primary cardiology; o Regular pill box fills from Paramedicine program.  o Current hypertensive/CHF regimen: digoxin 0.125 mg daily, Opsumit 10 mg daily, Uptravi 1600 mcg BID, Revatio 20 mg TID o Current hyperlipidemia regimen: rosuvastatin 10 mg daily; LDL at goal <70 on last check . Reports continued issues with episodes of diarrhea incontinence. Notes that part of her weight loss is due to being afraid to eat, because she can't make it to the bathroom in time. Often having to have her daughter come over to mop the floor.  Today, she notes that the dicyclomine has not been helping, though per her visit w/ EMT last week, it was helping. Notes that she has not heard from [facility] to schedule colonoscopy as recommended by [place] . Notes that Dr. [name] had recommended she follow up with pulmonary. Previously saw Dr. Patsey Berthold  Pharmacist Clinical Goal(s):  [name] Over the next 90 days, patient with work with PharmD and primary care provider to address optimized medication management  Interventions: . Comprehensive medication review performed, medication list updated in electronic medical record . Inter-disciplinary care team collaboration (see longitudinal plan of care) . Encouraged to trial time w/o [name], and monitor weights and blood glucose.  [name] Discussed above concerns w/ glucometer; contacted [employer]. Provided the clinical phone number for patient to call to see if she can return the True Metrix meter and strips, and request an override so that the new test strips will be able to run through insurance (otherwise, will declined d/t being too soon). Patient called [employer] to request this, and they note that a talking glucometer will only be covered if she is legally blind. She is amenable to starting to use True Metrix meter and strips. Encouraged to check periodically, up to BID, to ensure no loss of glucose control w/ [name] d/c . Patient will call [facility] clinic to schedule f/u with Dr. [name] . Patient will call [facility] to f/u with them about who to contact to schedule colonoscopy w/ [facility] . Patient will call [facility] to schedule f/u with Dr. Patsey Berthold, will let us know if she  needs a new referral placed.   Patient Self Care Activities:  . Patient will check blood glucose BID, document, and provide at future appointments . Patient will focus on medication adherence by using pill boxes filled by paramedicine . Patient will report any questions or concerns to provider    Please see past updates related to this goal by clicking on the "Past Updates" button in the selected goal         This is a list of the screening recommended for you and due dates:  Health Maintenance  Topic Date Due  . Colon Cancer Screening  09/10/2019  . Eye exam for diabetics  01/10/2020  . Flu Shot  01/22/2020  . Hemoglobin A1C  05/06/2020  . Urine Protein Check  07/07/2020  . Complete foot exam   11/03/2020  . Tetanus Vaccine  08/04/2023  . DEXA scan (bone density measurement)  Completed  . COVID-19 Vaccine  Completed  .  Hepatitis C: One time screening is recommended by Center for Disease Control  (CDC) for  adults born from [birth year] through 1967.   Completed    Immunizations Immunization History  Administered Date(s) Administered  . Fluad Quad(high Dose 65+) 07/08/2019  . Influenza, High Dose Seasonal PF 07/23/2016, 06/15/2017, 07/02/2018  . Influenza, Seasonal, Injecte, Preservative Fre 07/27/2012  . Influenza,inj,Quad PF,6+ Mos 06/10/2013, 05/01/2014, 03/19/2015  . PFIZER SARS-COV-2 Vaccination 09/23/2019, 10/17/2019  . Pneumococcal Conjugate-13 10/06/2013  . Pneumococcal Polysaccharide-23 04/28/2012  . Td 08/03/2013   Keep all routine maintenance appointments.   Follow up with CCM 01/16/20 @ 3:45  Follow up 02/08/20 @ 11:30  Advanced directives: End of life planning; Advance aging; Advanced directives discussed.  Copy of current HCPOA/Living Will requested.    Conditions/risks identified: none new  Follow up in one year for your annual wellness visit    Preventive Care 65 Years and Older, Female Preventive care refers to lifestyle choices and visits with your health care provider that can promote health and wellness. What does preventive care include?  A yearly physical exam. This is also called an annual well check.  Dental exams once or twice a year.  Routine eye exams. Ask your health care provider how often you should have your eyes  checked.  Personal lifestyle choices, including:  Daily care of your teeth and gums.  Regular physical activity.  Eating a healthy diet.  Avoiding tobacco and drug use.  Limiting alcohol use.  Practicing safe sex.  Taking low-dose aspirin every day.  Taking vitamin and mineral supplements as recommended by your health care provider. What happens during an annual well check? The services and screenings done by your health care provider during your annual well check will depend on your age, overall health, lifestyle risk factors, and family history of disease. Counseling  Your health care provider may ask you questions about your:  Alcohol use.  Tobacco use.  Drug use.  Emotional well-being.  Home and relationship well-being.  Sexual activity.  Eating habits.  History of falls.  Memory and ability to understand (cognition).  Work and work Statistician.  Reproductive health. Screening  You may have the following tests or measurements:  Height, weight, and BMI.  Blood pressure.  Lipid and cholesterol levels. These may be checked every 5 years, or more frequently if you are over 18 years old.  Skin check.  Lung cancer screening. You may have this screening every year starting at age 66 if you have a 30-pack-year history of smoking and currently  smoke or have quit within the past 15 years.  Fecal occult blood test (FOBT) of the stool. You may have this test every year starting at age 71.  Flexible sigmoidoscopy or colonoscopy. You may have a sigmoidoscopy every 5 years or a colonoscopy every 10 years starting at age 72.  Hepatitis C blood test.  Hepatitis B blood test.  Sexually transmitted disease (STD) testing.  Diabetes screening. This is done by checking your blood sugar (glucose) after you have not eaten for a while (fasting). You may have this done every 1-3 years.  Bone density scan. This is done to screen for osteoporosis. You may have this done  starting at age 73.  Mammogram. This may be done every 1-2 years. Talk to your health care provider about how often you should have regular mammograms. Talk with your health care provider about your test results, treatment options, and if necessary, the need for more tests. Vaccines  Your health care provider may recommend certain vaccines, such as:  Influenza vaccine. This is recommended every year.  Tetanus, diphtheria, and acellular pertussis (Tdap, Td) vaccine. You may need a Td booster every 10 years.  Zoster vaccine. You may need this after age 56.  Pneumococcal 13-valent conjugate (PCV13) vaccine. One dose is recommended after age 85.  Pneumococcal polysaccharide (PPSV23) vaccine. One dose is recommended after age 58. Talk to your health care provider about which screenings and vaccines you need and how often you need them. This information is not intended to replace advice given to you by your health care provider. Make sure you discuss any questions you have with your health care provider. Document Released: 07/06/2015 Document Revised: 02/27/2016 Document Reviewed: 04/10/2015 Elsevier Interactive Patient Education  2017 [place] Prevention in the Home Falls can cause injuries. They can happen to people of all ages. There are many things you can do to make your home safe and to help prevent falls. What can I do on the outside of my home?  Regularly fix the edges of walkways and driveways and fix any cracks.  Remove anything that might make you trip as you walk through a door, such as a raised step or threshold.  Trim any bushes or trees on the path to your home.  Use bright outdoor lighting.  Clear any walking paths of anything that might make someone trip, such as rocks or tools.  Regularly check to see if handrails are loose or broken. Make sure that both sides of any steps have handrails.  Any raised decks and porches should have guardrails on the  edges.  Have any leaves, snow, or ice cleared regularly.  Use sand or salt on walking paths during winter.  Clean up any spills in your garage right away. This includes oil or grease spills. What can I do in the bathroom?  Use night lights.  Install grab bars by the toilet and in the tub and shower. Do not use towel bars as grab bars.  Use non-skid mats or decals in the tub or shower.  If you need to sit down in the shower, use a plastic, non-slip stool.  Keep the floor dry. Clean up any water that spills on the floor as soon as it happens.  Remove soap buildup in the tub or shower regularly.  Attach bath mats securely with double-sided non-slip rug tape.  Do not have throw rugs and other things on the floor that can make you trip. What can I do in  the bedroom?  Use night lights.  Make sure that you have a light by your bed that is easy to reach.  Do not use any sheets or blankets that are too big for your bed. They should not hang down onto the floor.  Have a firm chair that has side arms. You can use this for support while you get dressed.  Do not have throw rugs and other things on the floor that can make you trip. What can I do in the kitchen?  Clean up any spills right away.  Avoid walking on wet floors.  Keep items that you use a lot in easy-to-reach places.  If you need to reach something above you, use a strong step stool that has a grab bar.  Keep electrical cords out of the way.  Do not use floor polish or wax that makes floors slippery. If you must use wax, use non-skid floor wax.  Do not have throw rugs and other things on the floor that can make you trip. What can I do with my stairs?  Do not leave any items on the stairs.  Make sure that there are handrails on both sides of the stairs and use them. Fix handrails that are broken or loose. Make sure that handrails are as long as the stairways.  Check any carpeting to make sure that it is firmly  attached to the stairs. Fix any carpet that is loose or worn.  Avoid having throw rugs at the top or bottom of the stairs. If you do have throw rugs, attach them to the floor with carpet tape.  Make sure that you have a light switch at the top of the stairs and the bottom of the stairs. If you do not have them, ask someone to add them for you. What else can I do to help prevent falls?  Wear shoes that:  Do not have high heels.  Have rubber bottoms.  Are comfortable and fit you well.  Are closed at the toe. Do not wear sandals.  If you use a stepladder:  Make sure that it is fully opened. Do not climb a closed stepladder.  Make sure that both sides of the stepladder are locked into place.  Ask someone to hold it for you, if possible.  Clearly mark and make sure that you can see:  Any grab bars or handrails.  First and last steps.  Where the edge of each step is.  Use tools that help you move around (mobility aids) if they are needed. These include:  Canes.  Walkers.  Scooters.  Crutches.  Turn on the lights when you go into a dark area. Replace any light bulbs as soon as they burn out.  Set up your furniture so you have a clear path. Avoid moving your furniture around.  If any of your floors are uneven, fix them.  If there are any pets around you, be aware of where they are.  Review your medicines with your doctor. Some medicines can make you feel dizzy. This can increase your chance of falling. Ask your doctor what other things that you can do to help prevent falls. This information is not intended to replace advice given to you by your health care provider. Make sure you discuss any questions you have with your health care provider. Document Released: 04/05/2009 Document Revised: 11/15/2015 Document Reviewed: 07/14/2014 Elsevier Interactive Patient Education  2017 [employer].

## 2019-12-15 NOTE — Progress Notes (Signed)
I have reviewed the above note and agree.  Quinisha Mould, M.D.  

## 2019-12-15 NOTE — Progress Notes (Signed)
Subjective:   Debra Saunders is a 74 y.o. female who presents for Medicare Annual (Subsequent) preventive examination.  Review of Systems    No ROS.  Medicare Wellness Virtual Visit.   Cardiac Risk Factors include: advanced age (>20men, >9 women);hypertension;diabetes mellitus     Objective:    Today's Vitals   12/15/19 1401  BP: (!) 143/71  Pulse: (!) 58  Weight: 179 lb 8 oz (81.4 kg)  Height: 5' (1.524 m)   Body mass index is 35.06 kg/m.  Advanced Directives 12/15/2019 11/29/2019 10/19/2019 10/05/2019 09/27/2019 02/14/2019 07/02/2018  Does Patient Have a Medical Advance Directive? Yes Yes Yes Yes No Yes Yes  Type of Industrial/product designer of [place];Living will Healthcare Power of [place];Living will  Does patient want to make changes to medical advance directive? No - Patient declined - No - Patient declined - - No - Patient declined -  Copy of Helper in Chart? No - copy requested - No - copy requested - - - No - copy requested  Would patient like information on creating a medical advance directive? - - - - No - Patient declined - -  Pre-existing out of facility DNR order (yellow form or pink MOST form) - - - - - - -    Current Medications (verified) Outpatient Encounter Medications as of 12/15/2019  Medication Sig  . acetaminophen (TYLENOL) 325 MG tablet Take 325-650 mg by mouth every 6 (six) hours as needed for mild pain. Reported on 10/10/2015  . albuterol (PROVENTIL) (2.5 MG/3ML) 0.083% nebulizer solution Take 3 mLs (2.5 mg total) by nebulization every 6 (six) hours. (Patient taking differently: Take 2.5 mg by nebulization every 6 (six) hours as needed. )  . dicyclomine (BENTYL) 20 MG tablet Take 1 tablet (20 mg total) by mouth every 6 (six) hours as needed (diarrhea, abdominal pain). (Patient taking differently: Take 20 mg by  mouth every 6 (six) hours as needed (diarrhea, abdominal pain). She is taking twice daily)  . digoxin (LANOXIN) 0.125 MG tablet Take 1 tablet by mouth once daily  . ELIQUIS 5 MG TABS tablet TAKE 1 TABLET BY MOUTH TWICE DAILY. CALL OFFICE TO SCHEDULE OFFICE VISIT [phone].  [name] gabapentin (NEURONTIN) 100 MG capsule TAKE 2 CAPSULES BY MOUTH THREE TIMES DAILY  . glucose blood test strip Use with True Metrix glucometer to check sugars BID  . Lancets MISC Use with True Metrix glucometer to check sugars BID  . macitentan (OPSUMIT) 10 MG tablet Take 1 tablet (10 mg total) by mouth daily.  . meclizine (ANTIVERT) 25 MG tablet Take 1 tablet (25 mg total) by mouth 3 (three) times daily as needed for dizziness.  . Menthol, Topical Analgesic, (BIOFREEZE EX) Apply 1 application topically as needed (for pain).   . ondansetron (ZOFRAN) 8 MG tablet Take 1 tablet (8 mg total) by mouth every 8 (eight) hours as needed for nausea or vomiting. (Patient not taking: Reported on 12/01/2019)  . OXYGEN Inhale 4 L into the lungs continuous.  . potassium chloride SA (K-DUR) 20 MEQ tablet Take 1 tablet (20 mEq total) by mouth daily.  . rosuvastatin (CRESTOR) 10 MG tablet Take 1 tablet by mouth once daily  . sildenafil (REVATIO) 20 MG tablet Take 1 tablet (20 mg total) by mouth 3 (three) times daily.  [name] spironolactone (ALDACTONE) 25 MG tablet TAKE 1 TABLET BY MOUTH AT BEDTIME  .  torsemide (DEMADEX) 20 MG tablet TAKE 3 TABLETS BY MOUTH TWICE DAILY  . UPTRAVI 800 MCG TABS Take 1,200 mcg by mouth 2 (two) times daily.   No facility-administered encounter medications on file as of 12/15/2019.    Allergies (verified) Aspirin, Other, and Penicillins   History: Past Medical History:  Diagnosis Date  . Arthritis   . Atrial fibrillation, persistent ([place])    a. s/p TEE-guided DCCV on 08/20/2017  . Cardiorenal syndrome    a. 09/2017 Creat rose to 2.7 w/ diuresis-->HD x 2.  . Chest pain    a. 01/2012 Cath: nonobs dzs;  c. 04/2014  MV: No ischemia.  . Chronic back pain   . Chronic combined systolic (congestive) and diastolic (congestive) heart failure ([place])    a. Dx 2005 @ [facility];  b. 02/2014 Echo: EF 55-60%; c. 07/2017: EF 30-35%, diffuse HK, trivial AI, severe MR, moderate TR; d. 09/2017 Echo: EF 40-45%; e. 02/2018 Echo: EF 55-60%, Gr1 DD; f. 08/2019 Echo: EF 60-65%, Gr2 DD, Nl RV fxn, RVSP 49.64mmHg. Mod-Sev MR. Mild to mod TR. Mod PR. RA ~ 20mmHg.  [name] Chronic respiratory failure (HCC)    a. on home O2  . COPD (chronic obstructive pulmonary disease) ([place])    a. Home O2 - 3lpm  . Gastritis   . History of intracranial hemorrhage    a. 6/14 described by neurosurgery as "small frontal posttraumatic SAH " - pt was on xarelto @ the time.  . Hyperlipidemia   . Hypertension   . Interstitial lung disease ([place])   . Lipoma of back   . Lymphedema    a. Chronic LLE edema.  [name] NICM (nonischemic cardiomyopathy) ([place])    a. 09/2017 Echo: EF 40-45%; b. 09/2017 Cath: minor, insignificant CAD; c. 02/2018 Echo: EF 55-60%, Gr1 DD; d. 08/2019 Echo: EF 60-65%, Gr2 DD.  [name] Obesity   . PAH (pulmonary artery hypertension) ([place])    a. 09/2017 Echo: PASP 71mmHg; b. 09/2017 RHC: RA 23, RV 84/13, PA 84/29, PCWP 20, CO 3.89, CI 1.89. LVEDP 18.  . Sepsis with metabolic encephalopathy ([place])   . Sleep apnea   . Streptococcal bacteremia   . Stroke ([place])   . Subarachnoid hemorrhage ([place]) 01/19/2013  . Symptomatic bradycardia    a. s/p BSX PPM.  . Thyroid nodule   . Type II diabetes mellitus ([place])   . Urinary incontinence    Past Surgical History:  Procedure Laterality Date  . ABDOMINAL HYSTERECTOMY  1969  . BREAST BIOPSY Right 10/28/2019   lymph node bx, [place] Bx, pending path   . CARDIAC CATHETERIZATION  2013   @ [place]: No obstructive CAD: Only 20% ostial left circumflex.  [name] CARDIOVERSION N/A 10/12/2017   Procedure: CARDIOVERSION;  Surgeon: Minna Merritts, MD;  Location: [facility] ORS;  Service: Cardiovascular;  Laterality: N/A;  . CHOLECYSTECTOMY    .  COLONOSCOPY WITH PROPOFOL N/A 09/10/2018   Procedure: COLONOSCOPY WITH PROPOFOL;  Surgeon: Manya Silvas, MD;  Location: [facility] ENDOSCOPY;  Service: Endoscopy;  Laterality: N/A;  . CYST REMOVAL TRUNK  2002   BACK  . DIALYSIS/PERMA CATHETER INSERTION N/A 10/06/2017   Procedure: DIALYSIS/PERMA CATHETER INSERTION;  Surgeon: Katha Cabal, MD;  Location: [place] CV LAB;  Service: Cardiovascular;  Laterality: N/A;  . INSERT / REPLACE / REMOVE PACEMAKER    . lipoma removal  2014   back  . PACEMAKER INSERTION  [address] [employer] dual chamber pacemaker implanted by Dr Caryl Comes for symptomatic bradycardia  .  PERMANENT PACEMAKER INSERTION N/A 01/09/2014   Procedure: PERMANENT PACEMAKER INSERTION;  Surgeon: Deboraha Sprang, MD;  Location: [facility] CATH LAB;  Service: Cardiovascular;  Laterality: N/A;  . RIGHT HEART CATH N/A 04/19/2018   Procedure: RIGHT HEART CATH;  Surgeon: Larey Dresser, MD;  Location: [place] CV LAB;  Service: Cardiovascular;  Laterality: N/A;  . RIGHT/LEFT HEART CATH AND CORONARY ANGIOGRAPHY N/A 10/19/2017   Procedure: RIGHT/LEFT HEART CATH AND CORONARY ANGIOGRAPHY;  Surgeon: Wellington Hampshire, MD;  Location: [place] CV LAB;  Service: Cardiovascular;  Laterality: N/A;  . TEE WITHOUT CARDIOVERSION N/A 08/20/2017   Procedure: TRANSESOPHAGEAL ECHOCARDIOGRAM (TEE) & Direct current cardioversion;  Surgeon: Minna Merritts, MD;  Location: [facility] ORS;  Service: Cardiovascular;  Laterality: N/A;  . TRIGGER FINGER RELEASE     Family History  Problem Relation Age of Onset  . Heart disease Mother   . Hypertension Mother   . COPD Mother        was a smoker  . Thyroid disease Mother   . Hypertension Father   . Hypertension Sister   . Thyroid disease Sister   . Breast cancer Sister   . Hypertension Sister   . Thyroid disease Sister   . Bone cancer Sister   . Breast cancer Maternal Aunt   . Kidney disease Neg Hx   . Bladder Cancer Neg Hx    Social History    Socioeconomic History  . Marital status: Divorced    Spouse name: Not on file  . Number of children: 4  . Years of education: 70  . Highest education level: High school graduate  Occupational History  . Occupation: Retired- factory work    Comment: exposed to Pharmacist, community  Tobacco Use  . Smoking status: Former Smoker    Packs/day: 0.30    Years: 2.00    Pack years: 0.60    Types: Cigarettes    Quit date: 06/23/1990    Years since quitting: 29.4  . Smokeless tobacco: Never Used  Vaping Use  . Vaping Use: Never used  Substance and Sexual Activity  . Alcohol use: No  . Drug use: No  . Sexual activity: Not Currently  Other Topics Concern  . Not on file  Social History Narrative   Lives in [place] with daughter. Has 4 children. No pets.      Work - Restaurant manager, fast food, retired      Diet - regular      02/14/2019: reports has Meals On Wheels delivered to her home;       Remote hx of smoking > 30 years; No alcohol-    Social Determinants of Health   Financial Resource Strain:   . Difficulty of Paying Living Expenses:   Food Insecurity:   . Worried About Charity fundraiser in the Last Year:   . Arboriculturist in the Last Year:   Transportation Needs: Unmet Transportation Needs  . Lack of Transportation (Medical): Yes  . Lack of Transportation (Non-Medical): No  Physical Activity:   . Days of Exercise per Week:   . Minutes of Exercise per Session:   Stress:   . Feeling of Stress :   Social Connections:   . Frequency of Communication with Friends and Family:   . Frequency of Social Gatherings with Friends and Family:   . Attends Religious Services:   . Active Member of Clubs or Organizations:   . Attends Archivist Meetings:   [name] Marital Status:  Tobacco Counseling Counseling given: Not Answered   Clinical Intake:  Pre-visit preparation completed: Yes        Diabetes: Yes (Followed by pcp)  How often do you need to have someone help you when  you read instructions, pamphlets, or other written materials from your doctor or pharmacy?: 1 - Never  Interpreter Needed?: No    Activities of Daily Living In your present state of health, do you have any difficulty performing the following activities: 12/15/2019 09/27/2019  Hearing? N N  Vision? N N  Difficulty concentrating or making decisions? N N  Walking or climbing stairs? Y Y  Comment - has ramp/ weakness  Dressing or bathing? N N  Doing errands, shopping? Y Y  Comment She does not drive daughter takes her  Conservation officer, nature and eating ? N N  Using the Toilet? N N  In the past six months, have you accidently leaked urine? Y Y  Comment Managed with daily brief patient wears depends  Do you have problems with loss of bowel control? Y Y  Comment Managed with daily brief. Plans to schedule colonoscopy with [facility]. patient has diarrhea and wears depends  Managing your Medications? [place] paramedic assist with managment/ pill box paramedicine/ q 2 weeks  Managing your Finances? N N  Housekeeping or managing your Housekeeping? N Y  Comment - family helps  Some recent data might be hidden    Patient Care Team: [name], MD as PCP - General (Family Medicine) Wellington Hampshire, MD as PCP - Cardiology (Cardiology) Deboraha Sprang, MD as PCP - Electrophysiology (Cardiology) Thom Chimes, MD as Referring Physician (Allergy) Juanito Doom, MD as Referring Physician (Internal Medicine) De Hollingshead, [facility] as Pharmacist (Pharmacist) Cammie Sickle, MD as Consulting Physician (Hematology and Oncology) Jon Billings, RN as [place] any recent Medical Services you may have received from other than [facility] providers in the past year (date may be approximate).     Assessment:   This is a routine wellness examination for Yuliza.  I connected with Keiana today by telephone and verified that I am speaking with  the correct person using two identifiers. Location patient: home Location provider: work Persons participating in the virtual visit: patient, Marine scientist.    I discussed the limitations, risks, security and privacy concerns of performing an evaluation and management service by telephone and the availability of in person appointments. The patient expressed understanding and verbally consented to this telephonic visit.    Interactive audio and video telecommunications were attempted between this provider and patient, however failed, due to patient having technical difficulties OR patient did not have access to video capability.  We continued and completed visit with audio only.  Some vital signs may be absent or patient reported.   Hearing/Vision screen  Hearing Screening   125Hz  250Hz  500Hz  1000Hz  2000Hz  3000Hz  4000Hz  6000Hz  8000Hz   Right ear:           Left ear:           Comments: Patient is able to hear conversational tones without difficulty.  No issues reported.   Vision Screening Comments: Followed by [facility] Wears corrective lenses Cataract extraction, bilateral Visual acuity not assessed, virtual visit.  They have seen their ophthalmologist in the last 12 months.   Dietary issues and exercise activities discussed: Current Exercise Habits: The patient does not participate in regular exercise at present  Goals  Patient Stated   .  "I want to stay healthy" (pt-stated)      CARE PLAN ENTRY (see longtitudinal plan of care for additional care plan information)  Current Barriers:  . Diabetes: controlled; most recent A1c 5.2% o Reports significant fatigue lately. Is due for Venofer infusion next week o Reports continued occasional dizziness, treats w/ meclizine . Current antihyperglycemic regimen: none ([name] d/c d/t well controlled A1c, patient is concerned that she is going to gain weight back with stopping this medication) o APPROVED for [name] assistance  through 06/22/20 . Current blood glucose readings: has NOT been checking. Ran out of test strips, and refill was accidentally sent for TruMetrix, rather than Embrace Voice talking glucometer. She received an order of TruMetrix test strips, was told by the tech that they could not order. [facility]; they confirm that they cannot order Embrace Voice. New talking glucometer that is covered is Counsellor.  . Cardiovascular risk reduction- followed extensively by heart failure clinic/paramedicine program; Dr. [name] CHF, Dr. Caryl Comes EP, Dr. Fletcher Anon primary cardiology; o Regular pill box fills from Paramedicine program.  o Current hypertensive/CHF regimen: digoxin 0.125 mg daily, Opsumit 10 mg daily, Uptravi 1600 mcg BID, Revatio 20 mg TID o Current hyperlipidemia regimen: rosuvastatin 10 mg daily; LDL at goal <70 on last check . Reports continued issues with episodes of diarrhea incontinence. Notes that part of her weight loss is due to being afraid to eat, because she can't make it to the bathroom in time. Often having to have her daughter come over to mop the floor. Today, she notes that the dicyclomine has not been helping, though per her visit w/ EMT last week, it was helping. Notes that she has not heard from [facility] to schedule colonoscopy as recommended by [place] . Notes that Dr. [name] had recommended she follow up with pulmonary. Previously saw Dr. Patsey Berthold  Pharmacist Clinical Goal(s):  [name] Over the next 90 days, patient with work with PharmD and primary care provider to address optimized medication management  Interventions: . Comprehensive medication review performed, medication list updated in electronic medical record . Inter-disciplinary care team collaboration (see longitudinal plan of care) . Encouraged to trial time w/o [name], and monitor weights and blood glucose.  [name] Discussed above concerns w/ glucometer; contacted [employer]. Provided the clinical phone number for patient  to call to see if she can return the True Metrix meter and strips, and request an override so that the new test strips will be able to run through insurance (otherwise, will declined d/t being too soon). Patient called [employer] to request this, and they note that a talking glucometer will only be covered if she is legally blind. She is amenable to starting to use True Metrix meter and strips. Encouraged to check periodically, up to BID, to ensure no loss of glucose control w/ [name] d/c . Patient will call [facility] HVS clinic to schedule f/u with Dr. [name] . Patient will call [facility] GI to f/u with them about who to contact to schedule colonoscopy w/ [facility] . Patient will call [place] to schedule f/u with Dr. Patsey Berthold, will let us know if she needs a new referral placed.   Patient Self Care Activities:  . Patient will check blood glucose BID, document, and provide at future appointments . Patient will focus on medication adherence by using pill boxes filled by paramedicine . Patient will report any questions or concerns to provider   Please see past updates related  to this goal by clicking on the "Past Updates" button in the selected goal        Depression Screen PHQ 2/9 Scores 12/15/2019 11/04/2019 09/27/2019 07/06/2019 02/14/2019 07/02/2018 06/25/2018  PHQ - 2 Score 0 0 0 0 0 0 0  PHQ- 9 Score - - - - - - -    Fall Risk Fall Risk  12/15/2019 11/04/2019 09/27/2019 08/09/2019 07/06/2019  Falls in the past year? 0 0 0 0 0  Comment - - - - -  Number falls in past yr: 0 - - - 0  Comment - - - - -  Injury with Fall? - - - - -  Comment - - - - -  Risk for fall due to : - - - - -  Follow up Falls evaluation completed Falls evaluation completed - - Falls evaluation completed   Handrails in use while climbing stairs? Yes  Home free of loose throw rugs in walkways, pet beds, electrical cords, etc? Yes Adequate lighting in your home to reduce risk of falls? Yes   ASSISTIVE DEVICES  UTILIZED TO PREVENT FALLS:  Use of a cane, walker or w/c? Yes  Grab bars in the bathroom? No  Shower chair or bench in shower? Yes  Elevated toilet seat or a handicapped toilet? Yes   TIMED UP AND GO: Was the test performed? No, virtual visit.  Cognitive Function:     6CIT Screen 12/15/2019 07/02/2018 07/01/2017  What Year? 0 points 0 points 0 points  What month? 0 points 0 points 0 points  What time? 0 points 0 points 0 points  Count back from 20 - 0 points 0 points  Months in reverse 0 points 0 points 0 points  Repeat phrase - 0 points 0 points  Total Score - 0 0    Immunizations Immunization History  Administered Date(s) Administered  . Fluad Quad(high Dose 65+) 07/08/2019  . Influenza, High Dose Seasonal PF 07/23/2016, 06/15/2017, 07/02/2018  . Influenza, Seasonal, Injecte, Preservative Fre 07/27/2012  . Influenza,inj,Quad PF,6+ Mos 06/10/2013, 05/01/2014, 03/19/2015  . PFIZER SARS-COV-2 Vaccination 09/23/2019, 10/17/2019  . Pneumococcal Conjugate-13 10/06/2013  . Pneumococcal Polysaccharide-23 04/28/2012  . Td 08/03/2013   Health Maintenance Health Maintenance  Topic Date Due  . COLONOSCOPY  09/10/2019  . OPHTHALMOLOGY EXAM  01/10/2020  . INFLUENZA VACCINE  01/22/2020  . HEMOGLOBIN A1C  05/06/2020  . URINE MICROALBUMIN  07/07/2020  . FOOT EXAM  11/03/2020  . TETANUS/TDAP  08/04/2023  . DEXA SCAN  Completed  . COVID-19 Vaccine  Completed  . Hepatitis C Screening  Completed   Colorectal cancer screening- plans to schedule colonoscopy with [facility].   Dental Screening: Recommended annual dental exams for proper oral hygiene.  Community Resource Referral / Chronic Care Management: CRR required this visit?  No  CCM required this visit?  No    Plan:   Keep all routine maintenance appointments.   Follow up with CCM 01/16/20 @ 3:45  Follow up 02/08/20 @ 11:30  I have personally reviewed and noted the following in the patient's chart:   . Medical and social  history . Use of alcohol, tobacco or illicit drugs  . Current medications and supplements . Functional ability and status . Nutritional status . Physical activity . Advanced directives . List of other physicians . Hospitalizations, surgeries, and ER visits in previous 12 months . Vitals . Screenings to include cognitive, depression, and falls . Referrals and appointments  In addition, I have reviewed and discussed with  patient certain preventive protocols, quality metrics, and best practice recommendations. A written personalized care plan for preventive services as well as general preventive health recommendations were provided to patient via mychart.     Varney Biles, LPN   2/33/0076

## 2019-12-16 ENCOUNTER — Other Ambulatory Visit: Payer: Self-pay

## 2019-12-20 ENCOUNTER — Other Ambulatory Visit: Payer: Self-pay

## 2019-12-20 ENCOUNTER — Inpatient Hospital Stay: Payer: Medicare HMO

## 2019-12-20 VITALS — BP 105/63 | HR 63 | Temp 96.3°F | Resp 18

## 2019-12-20 DIAGNOSIS — Z87891 Personal history of nicotine dependence: Secondary | ICD-10-CM | POA: Diagnosis not present

## 2019-12-20 DIAGNOSIS — I130 Hypertensive heart and chronic kidney disease with heart failure and stage 1 through stage 4 chronic kidney disease, or unspecified chronic kidney disease: Secondary | ICD-10-CM | POA: Diagnosis not present

## 2019-12-20 DIAGNOSIS — E538 Deficiency of other specified B group vitamins: Secondary | ICD-10-CM | POA: Diagnosis not present

## 2019-12-20 DIAGNOSIS — D509 Iron deficiency anemia, unspecified: Secondary | ICD-10-CM | POA: Diagnosis not present

## 2019-12-20 DIAGNOSIS — Z86718 Personal history of other venous thrombosis and embolism: Secondary | ICD-10-CM | POA: Diagnosis not present

## 2019-12-20 DIAGNOSIS — E611 Iron deficiency: Secondary | ICD-10-CM

## 2019-12-20 DIAGNOSIS — Z803 Family history of malignant neoplasm of breast: Secondary | ICD-10-CM | POA: Diagnosis not present

## 2019-12-20 DIAGNOSIS — N183 Chronic kidney disease, stage 3 unspecified: Secondary | ICD-10-CM | POA: Diagnosis not present

## 2019-12-20 DIAGNOSIS — Z8679 Personal history of other diseases of the circulatory system: Secondary | ICD-10-CM | POA: Diagnosis not present

## 2019-12-20 MED ORDER — IRON SUCROSE 20 MG/ML IV SOLN
200.0000 mg | Freq: Once | INTRAVENOUS | Status: AC
  Administered 2019-12-20: 200 mg via INTRAVENOUS
  Filled 2019-12-20: qty 10

## 2019-12-20 MED ORDER — SODIUM CHLORIDE 0.9 % IV SOLN
Freq: Once | INTRAVENOUS | Status: AC
  Filled 2019-12-20: qty 250

## 2019-12-20 NOTE — Progress Notes (Signed)
Pt tolerated infusion well. No s/s of distress or reaction noted. Pt and VS stable at discharge.  

## 2019-12-21 ENCOUNTER — Other Ambulatory Visit (HOSPITAL_COMMUNITY): Payer: Self-pay | Admitting: Cardiology

## 2019-12-22 ENCOUNTER — Telehealth: Payer: Self-pay | Admitting: Family Medicine

## 2019-12-22 MED ORDER — POTASSIUM CHLORIDE CRYS ER 20 MEQ PO TBCR: 20.0000 meq | EXTENDED_RELEASE_TABLET | Freq: Every day | ORAL | 0 refills | Status: DC

## 2019-12-22 NOTE — Telephone Encounter (Signed)
[facility] called and said that the patient requested to have potassium chloride filled.

## 2019-12-22 NOTE — Telephone Encounter (Signed)
Sent to pharmacy. Future refills should come from her cardiologist.

## 2019-12-23 ENCOUNTER — Other Ambulatory Visit (HOSPITAL_COMMUNITY): Payer: Self-pay | Admitting: Cardiology

## 2019-12-23 ENCOUNTER — Other Ambulatory Visit: Payer: Self-pay | Admitting: Family Medicine

## 2019-12-28 ENCOUNTER — Telehealth: Payer: Self-pay

## 2019-12-29 NOTE — Telephone Encounter (Signed)
I called the patient and informed the patient that the provider stated she could take immodium for a day or two but she really needed to see GI.  She understood and stated also she wanted to keep her medications at [facility] [place] she does not want [facility] so disregard all faxes from [place].  Joeanna Howdyshell,cma

## 2019-12-29 NOTE — Telephone Encounter (Signed)
She can try imodium briefly for a day or two to see if it helps but she needs to see GI for follow-up to determine the best next step in management of this issue.

## 2020-01-02 ENCOUNTER — Other Ambulatory Visit (HOSPITAL_COMMUNITY): Payer: Self-pay

## 2020-01-02 ENCOUNTER — Encounter (HOSPITAL_COMMUNITY): Payer: Self-pay

## 2020-01-02 NOTE — Progress Notes (Signed)
Bgl: 152  Today had a home visit with [name].  She states still having diarrhea and she states her GI doctor wants her to go to [facility] for a specialist.  She states so hard to go to doctors outside [facility].  She is not weighing daily, she states her PCP advised her she can weigh once a week.  Advised her importance with Heart Failure to weigh daily.  Reminded her to make appt with HF clinic in [place], her yearly appt is up.  She states they have called her but her daughter Missy needs to schedule it. She states other than diarrhea and BP drops some she has been feeling good.  She states eats very little, she has been eating a lot of cabbage soup, advised could upset the diarrhea and she states will not.  Her weight is staying close to same.  She denies any chest pain or shortness of breath at rest.  She denies any more than her normal edema.  Abdomen does not feel tight.  She is off her diabetes medications and checks her sugar twice a week, has been staying ok.  She has all her medications and refilled her boxes.  Will continue to visit for heart failure.   [place] [phone]

## 2020-01-13 DIAGNOSIS — H40053 Ocular hypertension, bilateral: Secondary | ICD-10-CM | POA: Diagnosis not present

## 2020-01-13 LAB — HM DIABETES EYE EXAM

## 2020-01-16 ENCOUNTER — Ambulatory Visit (INDEPENDENT_AMBULATORY_CARE_PROVIDER_SITE_OTHER): Payer: Medicare HMO | Admitting: Pharmacist

## 2020-01-16 DIAGNOSIS — I5022 Chronic systolic (congestive) heart failure: Secondary | ICD-10-CM

## 2020-01-16 DIAGNOSIS — E1122 Type 2 diabetes mellitus with diabetic chronic kidney disease: Secondary | ICD-10-CM

## 2020-01-16 DIAGNOSIS — K529 Noninfective gastroenteritis and colitis, unspecified: Secondary | ICD-10-CM

## 2020-01-16 MED ORDER — TRULICITY 0.75 MG/0.5ML ~~LOC~~ SOAJ: 0.7500 mg | SUBCUTANEOUS | 0 refills | Status: DC

## 2020-01-16 NOTE — Patient Instructions (Signed)
Visit Information  Goals Addressed              This Visit's Progress     Patient Stated   .  "I want to stay healthy" (pt-stated)        [place] (see longtitudinal plan of care for additional care plan information)  Current Barriers:  . Financial, social, or community barriers:  o Reports that she is "putting weight back on", even with continued diarrhea (has been going on for months, per patient report). Wonders about going back on [name]. Has a supply at home from [facility] o Reports that she has avoided scheduling appt w/ [facility] because of transportation concerns. Notes her daughter has been too busy to schedule for her recently. Notes she had previously used [place] rides from [place], but her driver did not pick her back up from [place] to take her home.  o Reports her home weight this morning is 182.3 lbs this morning. Was 179 on 7/12 w/ EMT. Notes she is only weighing weekly because she feels so tired. o Overdue for scheduling f/u with Dr. [name], Dr. [name] . Diabetes: controlled; most recent A1c 5.2% . Current antihyperglycemic regimen: none at this time o APPROVED for [name] assistance through 06/22/20 . Current blood glucose readings:  o Fastings: 150-160s  . Cardiovascular risk reduction- followed extensively by heart failure clinic/paramedicine program; Dr. [name] CHF, Dr. Caryl Comes EP, Dr. Fletcher Anon primary cardiology; o Regular pill box fills from Paramedicine program.  o Current hypertensive/CHF regimen: digoxin 0.125 mg daily, Opsumit 10 mg daily, Uptravi 1600 mcg BID, Revatio 20 mg TID o Current hyperlipidemia regimen: rosuvastatin 10 mg daily; LDL at goal <70 on last check . Diarrhea: no benefit w/ dicyclomine, no benefit w/ discontinuing [name] . Notes that Dr. [name] had recommended she follow up with pulmonary. Previously saw Dr. Patsey Berthold  Pharmacist Clinical Goal(s):  [name] Over the next 90 days, patient with work with PharmD and primary care provider  to address optimized medication management  Interventions: . Comprehensive medication review performed, medication list updated in electronic medical record . Inter-disciplinary care team collaboration (see longitudinal plan of care) . Restart [name] 0.08 mg weekly. Patient counseled to discontinued and contact me/PCP if restarting GLP1 results in worsening diarrhea, or nausea that combined w/ diarrhea significantly reduces her appetite. Reviewed the importance of focusing on protein/vegetables to ensure healthy intakes . Encouraged to contact CHF clinic to schedule f/u with Dr. [name]. She verbalized understanding.  . Placed Care Guide referral for support w/ transportation resources, particularly to GI specialist in [place], so that her chronic diarrhea can be evaluated. Encouraged to speak with her daughter about making scheduling this appointment a priority. She verbalized understanding  Patient Self Care Activities:  . Patient will check blood glucose BID, document, and provide at future appointments . Patient will focus on medication adherence by using pill boxes filled by paramedicine . Patient will report any questions or concerns to provider   Please see past updates related to this goal by clicking on the "Past Updates" button in the selected goal         The patient verbalized understanding of instructions provided today and declined a print copy of patient instruction materials.   Plan:  - Scheduled f/u call in ~2-3 weeks  [name], PharmD, [place], [place] Pharmacist [place] [phone]

## 2020-01-16 NOTE — Chronic Care Management (AMB) (Signed)
Chronic Care Management   Follow Up Note   01/16/2020 Name: Debra Saunders MRN: [id number] DOB: [birth year]  Referred by: [name], MD Reason for referral : Chronic Care Management (Medication Management)   Debra Saunders is a 74 y.o. year old female who is a primary care patient of [name], [name], MD. The CCM team was consulted for assistance with chronic disease management and care coordination needs.    Contacted patient for medication management.   Review of patient status, including review of consultants reports, relevant laboratory and other test results, and collaboration with appropriate care team members and the patient's provider was performed as part of comprehensive patient evaluation and provision of chronic care management services.    SDOH (Social Determinants of Health) assessments performed: Yes See Care Plan activities for detailed interventions related to SDOH)  SDOH Interventions     Most Recent Value  SDOH Interventions  SDOH Interventions for the Following Domains Transportation  Financial Strain Interventions Other (Comment)  [medication assistance program]  Transportation Interventions Other (Comment)  [care guide referral placed]       Outpatient Encounter Medications as of 01/16/2020  Medication Sig  . digoxin (LANOXIN) 0.125 MG tablet Take 1 tablet by mouth once daily  . ELIQUIS 5 MG TABS tablet TAKE 1 TABLET BY MOUTH TWICE DAILY -  NEED  TO  SCHEDULE  APPT  . gabapentin (NEURONTIN) 100 MG capsule TAKE 2 CAPSULES BY MOUTH THREE TIMES DAILY  . glucose blood test strip Use with True Metrix glucometer to check sugars BID  . macitentan (OPSUMIT) 10 MG tablet Take 1 tablet (10 mg total) by mouth daily.  . meclizine (ANTIVERT) 25 MG tablet Take 1 tablet (25 mg total) by mouth 3 (three) times daily as needed for dizziness.  . Menthol, Topical Analgesic, (BIOFREEZE EX) Apply 1 application topically as needed (for pain).   . ondansetron (ZOFRAN) 8  MG tablet Take 1 tablet (8 mg total) by mouth every 8 (eight) hours as needed for nausea or vomiting.  . OXYGEN Inhale 4 L into the lungs continuous.  . potassium chloride SA (KLOR-CON) 20 MEQ tablet Take 1 tablet (20 mEq total) by mouth daily.  . rosuvastatin (CRESTOR) 10 MG tablet Take 1 tablet by mouth once daily  . Selexipag (UPTRAVI) 1600 MCG TABS Take 1,600 mcg by mouth 2 (two) times daily.  . sildenafil (REVATIO) 20 MG tablet Take 1 tablet (20 mg total) by mouth 3 (three) times daily.  [name] spironolactone (ALDACTONE) 25 MG tablet TAKE 1 TABLET BY MOUTH AT BEDTIME  . torsemide (DEMADEX) 20 MG tablet TAKE 3 TABLETS BY MOUTH TWICE DAILY  . acetaminophen (TYLENOL) 325 MG tablet Take 325-650 mg by mouth every 6 (six) hours as needed for mild pain. Reported on 10/10/2015  . albuterol (PROVENTIL) (2.5 MG/3ML) 0.083% nebulizer solution Take 3 mLs (2.5 mg total) by nebulization every 6 (six) hours. (Patient taking differently: Take 2.5 mg by nebulization every 6 (six) hours as needed. )  . dicyclomine (BENTYL) 20 MG tablet Take 1 tablet (20 mg total) by mouth every 6 (six) hours as needed (diarrhea, abdominal pain). (Patient not taking: Reported on 01/02/2020)  . Dulaglutide ([name]) 1.91 YN/8.2NF SOPN Inject 0.5 mLs (0.75 mg total) into the skin once a week.  . Lancets MISC Use with True Metrix glucometer to check sugars BID   No facility-administered encounter medications on file as of 01/16/2020.     Objective:   Goals Addressed  This Visit's Progress     Patient Stated   .  "I want to stay healthy" (pt-stated)        [place] (see longtitudinal plan of care for additional care plan information)  Current Barriers:  . Financial, social, or community barriers:  o Reports that she is "putting weight back on", even with continued diarrhea (has been going on for months, per patient report). Wonders about going back on [name]. Has a supply at home from [facility] o Reports  that she has avoided scheduling appt w/ [facility] because of transportation concerns. Notes her daughter has been too busy to schedule for her recently. Notes she had previously used [place] rides from [place], but her driver did not pick her back up from [place] to take her home.  o Reports her home weight this morning is 182.3 lbs this morning. Was 179 on 7/12 w/ EMT. Notes she is only weighing weekly because she feels so tired. o Overdue for scheduling f/u with Dr. [name], Dr. [name] . Diabetes: controlled; most recent A1c 5.2% . Current antihyperglycemic regimen: none at this time o APPROVED for [name] assistance through 06/22/20 . Current blood glucose readings:  o Fastings: 150-160s  . Cardiovascular risk reduction- followed extensively by heart failure clinic/paramedicine program; Dr. [name] CHF, Dr. Caryl Comes EP, Dr. Fletcher Anon primary cardiology; o Regular pill box fills from Paramedicine program.  o Current hypertensive/CHF regimen: digoxin 0.125 mg daily, Opsumit 10 mg daily, Uptravi 1600 mcg BID, Revatio 20 mg TID o Current hyperlipidemia regimen: rosuvastatin 10 mg daily; LDL at goal <70 on last check . Diarrhea: no benefit w/ dicyclomine, no benefit w/ discontinuing [name] . Notes that Dr. [name] had recommended she follow up with pulmonary. Previously saw Dr. Patsey Berthold  Pharmacist Clinical Goal(s):  [name] Over the next 90 days, patient with work with PharmD and primary care provider to address optimized medication management  Interventions: . Comprehensive medication review performed, medication list updated in electronic medical record . Inter-disciplinary care team collaboration (see longitudinal plan of care) . Restart [name] 3.84 mg weekly. Patient counseled to discontinued and contact me/PCP if restarting GLP1 results in worsening diarrhea, or nausea that combined w/ diarrhea significantly reduces her appetite. Reviewed the importance of focusing on protein/vegetables to  ensure healthy intakes . Encouraged to contact CHF clinic to schedule f/u with Dr. [name]. She verbalized understanding.  . Placed Care Guide referral for support w/ transportation resources, particularly to GI specialist in [place], so that her chronic diarrhea can be evaluated. Encouraged to speak with her daughter about making scheduling this appointment a priority. She verbalized understanding  Patient Self Care Activities:  . Patient will check blood glucose BID, document, and provide at future appointments . Patient will focus on medication adherence by using pill boxes filled by paramedicine . Patient will report any questions or concerns to provider   Please see past updates related to this goal by clicking on the "Past Updates" button in the selected goal          Plan:  - Scheduled f/u call in ~2-3 weeks  [name], PharmD, [place], [place] Pharmacist [place] [phone]

## 2020-01-17 ENCOUNTER — Other Ambulatory Visit: Payer: Self-pay | Admitting: Family Medicine

## 2020-01-17 ENCOUNTER — Other Ambulatory Visit (HOSPITAL_COMMUNITY): Payer: Self-pay | Admitting: Cardiology

## 2020-01-24 ENCOUNTER — Other Ambulatory Visit (HOSPITAL_COMMUNITY): Payer: Self-pay | Admitting: Cardiology

## 2020-01-24 ENCOUNTER — Other Ambulatory Visit: Payer: Self-pay | Admitting: Family Medicine

## 2020-01-25 ENCOUNTER — Other Ambulatory Visit (HOSPITAL_COMMUNITY): Payer: Self-pay

## 2020-01-25 ENCOUNTER — Encounter (HOSPITAL_COMMUNITY): Payer: Self-pay

## 2020-01-25 NOTE — Progress Notes (Signed)
Today had a home visit with [name].  She states she has gained some weight.  She has some swelling in ankle area of r foot, she states goes away at night.  She states been eating all the time.  She has not been watching high sodium foods and been using a salt shaker.  She states will get back on it watching what she eats.  She states diarrhea is same rather she eats or not. She denies any increase shortness of breath, headaches, or chest pain.  She still has dizziness that has been checked out from numerous doctors.  She takes all her medications.  She has all her medications.  Refilled her boxes.  She has eliquis, gabapentin and spironolactone ready to be picked up.  Her daughter is getting them today.  She is aware of cardiology appt tomorrow.  Will continue to visit for heart failure, diet and medication compliance.   [place] [phone]

## 2020-01-26 ENCOUNTER — Encounter: Payer: Self-pay | Admitting: Cardiovascular Disease

## 2020-01-26 ENCOUNTER — Ambulatory Visit: Payer: Medicare HMO | Admitting: Cardiovascular Disease

## 2020-01-26 ENCOUNTER — Other Ambulatory Visit: Payer: Self-pay

## 2020-01-26 VITALS — BP 110/60 | HR 66 | Ht 60.0 in | Wt 181.4 lb

## 2020-01-26 DIAGNOSIS — I480 Paroxysmal atrial fibrillation: Secondary | ICD-10-CM | POA: Diagnosis not present

## 2020-01-26 DIAGNOSIS — I10 Essential (primary) hypertension: Secondary | ICD-10-CM | POA: Diagnosis not present

## 2020-01-26 DIAGNOSIS — E785 Hyperlipidemia, unspecified: Secondary | ICD-10-CM

## 2020-01-26 DIAGNOSIS — I5032 Chronic diastolic (congestive) heart failure: Secondary | ICD-10-CM | POA: Diagnosis not present

## 2020-01-26 MED ORDER — TORSEMIDE 20 MG PO TABS: 40.0000 mg | ORAL_TABLET | Freq: Two times a day (BID) | ORAL | Status: DC

## 2020-01-26 NOTE — Progress Notes (Signed)
Cardiology Office Note   Date:  01/26/2020   ID:  Debra Saunders, DOB 26-Jun-1945, MRN [id number]  PCP:  [name], MD  Cardiologist:   [name], MD   Chief Complaint  Patient presents with  . office visit    3-4 month F/U; Meds verbally reviewed with patient.      History of Present Illness: Debra Saunders is a 74 y.o. female who presents for a follow-up visit regarding chronic diastolic heart failure, severe pulmonary hypertension, chronic atypical musculoskeletal chest pain, atrial fibrillation on anticoagulation and previous bradycardia status post dual-chamber pacemaker placement. She has chronic medical conditions that include diabetes mellitus, hypertension and obesity. She also has chronic left leg lymphedema. Previous cardiac catheterization in August of 2013 showed no evidence of obstructive coronary artery disease. She had prior subarachnoid hemorrhage with cardiac pulmonary arrest in July 2014 in the setting of anticoagulation for suspected pulmonary embolism.  She underwent a dual-chamber pacemaker placement by Dr. Caryl Comes in [phone] for symptomatic bradycardia.  She had multiple hospitalizations for heart failure.  She is usually sensitive to aggressive diuresis with renal failure.  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 The patient has been having issues with poor appetite and diarrhea.  She lost 50 pounds over the last year.  She continues to use home oxygen.  She denies chest pain or worsening dyspnea.  She feels very tired and has no energy.   Past Medical History:  Diagnosis Date  . Arthritis   . Atrial fibrillation, persistent ([place])    a. s/p TEE-guided DCCV on 08/20/2017  . Cardiorenal syndrome    a. 09/2017 Creat rose to 2.7 w/ diuresis-->HD x 2.  . Chest pain    a. 01/2012 Cath: nonobs dzs;  c. 04/2014 MV: No ischemia.  . Chronic back pain   . Chronic combined systolic (congestive) and  diastolic (congestive) heart failure ([place])    a. Dx 2005 @ [facility];  b. 02/2014 Echo: EF 55-60%; c. 07/2017: EF 30-35%, diffuse HK, trivial AI, severe MR, moderate TR; d. 09/2017 Echo: EF 40-45%; e. 02/2018 Echo: EF 55-60%, Gr1 DD; f. 08/2019 Echo: EF 60-65%, Gr2 DD, Nl RV fxn, RVSP 49.22mmHg. Mod-Sev MR. Mild to mod TR. Mod PR. RA ~ 31mmHg.  [name] Chronic respiratory failure (HCC)    a. on home O2  . COPD (chronic obstructive pulmonary disease) ([place])    a. Home O2 - 3lpm  . Gastritis   . History of intracranial hemorrhage    a. 6/14 described by neurosurgery as "small frontal posttraumatic SAH " - pt was on xarelto @ the time.  . Hyperlipidemia   . Hypertension   . Interstitial lung disease ([place])   . Lipoma of back   . Lymphedema    a. Chronic LLE edema.  [name] NICM (nonischemic cardiomyopathy) ([place])    a. 09/2017 Echo: EF 40-45%; b. 09/2017 Cath: minor, insignificant CAD; c. 02/2018 Echo: EF 55-60%, Gr1 DD; d. 08/2019 Echo: EF 60-65%, Gr2 DD.  [name] Obesity   . PAH (pulmonary artery hypertension) ([place])    a. 09/2017 Echo: PASP 10mmHg; b. 09/2017 RHC: RA 23, RV 84/13, PA 84/29, PCWP 20, CO 3.89, CI 1.89. LVEDP 18.  . Sepsis with metabolic encephalopathy ([place])   . Sleep apnea   . Streptococcal bacteremia   . Stroke ([place])   . Subarachnoid hemorrhage ([place]) 01/19/2013  . Symptomatic bradycardia    a. s/p BSX PPM.  . Thyroid  nodule   . Type II diabetes mellitus ([place])   . Urinary incontinence     Past Surgical History:  Procedure Laterality Date  . ABDOMINAL HYSTERECTOMY  1969  . BREAST BIOPSY Right 10/28/2019   lymph node bx, [place] Bx, pending path   . CARDIAC CATHETERIZATION  2013   @ [facility]: No obstructive CAD: Only 20% ostial left circumflex.  [name] CARDIOVERSION N/A 10/12/2017   Procedure: CARDIOVERSION;  Surgeon: Minna Merritts, MD;  Location: [facility] ORS;  Service: Cardiovascular;  Laterality: N/A;  . CHOLECYSTECTOMY    . COLONOSCOPY WITH PROPOFOL N/A 09/10/2018   Procedure: COLONOSCOPY WITH PROPOFOL;   Surgeon: Manya Silvas, MD;  Location: [facility] ENDOSCOPY;  Service: Endoscopy;  Laterality: N/A;  . CYST REMOVAL TRUNK  2002   BACK  . DIALYSIS/PERMA CATHETER INSERTION N/A 10/06/2017   Procedure: DIALYSIS/PERMA CATHETER INSERTION;  Surgeon: Katha Cabal, MD;  Location: [place] CV LAB;  Service: Cardiovascular;  Laterality: N/A;  . INSERT / REPLACE / REMOVE PACEMAKER    . lipoma removal  2014   back  . PACEMAKER INSERTION  [address] [employer] dual chamber pacemaker implanted by Dr Caryl Comes for symptomatic bradycardia  . PERMANENT PACEMAKER INSERTION N/A 01/09/2014   Procedure: PERMANENT PACEMAKER INSERTION;  Surgeon: Deboraha Sprang, MD;  Location[facility] CATH LAB;  Service: Cardiovascular;  Laterality: N/A;  . RIGHT HEART CATH N/A 04/19/2018   Procedure: RIGHT HEART CATH;  Surgeon: Larey Dresser, MD;  Location: [place] CV LAB;  Service: Cardiovascular;  Laterality: N/A;  . RIGHT/LEFT HEART CATH AND CORONARY ANGIOGRAPHY N/A 10/19/2017   Procedure: RIGHT/LEFT HEART CATH AND CORONARY ANGIOGRAPHY;  Surgeon: Wellington Hampshire, MD;  Location: [place] CV LAB;  Service: Cardiovascular;  Laterality: N/A;  . TEE WITHOUT CARDIOVERSION N/A 08/20/2017   Procedure: TRANSESOPHAGEAL ECHOCARDIOGRAM (TEE) & Direct current cardioversion;  Surgeon: Minna Merritts, MD;  Location: [facility] ORS;  Service: Cardiovascular;  Laterality: N/A;  . TRIGGER FINGER RELEASE       Current Outpatient Medications  Medication Sig Dispense Refill  . acetaminophen (TYLENOL) 325 MG tablet Take 325-650 mg by mouth every 6 (six) hours as needed for mild pain. Reported on 10/10/2015    . albuterol (PROVENTIL) (2.5 MG/3ML) 0.083% nebulizer solution Take 3 mLs (2.5 mg total) by nebulization every 6 (six) hours. (Patient taking differently: Take 2.5 mg by nebulization every 6 (six) hours as needed. ) 75 mL 12  . apixaban (ELIQUIS) 5 MG TABS tablet Take 1 tablet (5 mg total) by mouth 2 (two) times daily. Please call  for office visit [phone] 60 tablet 0  . digoxin (LANOXIN) 0.125 MG tablet Take 1 tablet by mouth once daily 90 tablet 3  . Dulaglutide ([name]) 1.19 JY/7.8GN SOPN Inject 0.5 mLs (0.75 mg total) into the skin once a week. 4 pen 0  . gabapentin (NEURONTIN) 100 MG capsule TAKE 2 CAPSULES BY MOUTH THREE TIMES DAILY 180 capsule 0  . glucose blood test strip Use with True Metrix glucometer to check sugars BID 200 each 3  . Lancets MISC Use with True Metrix glucometer to check sugars BID 200 each 3  . macitentan (OPSUMIT) 10 MG tablet Take 1 tablet (10 mg total) by mouth daily. 30 tablet 5  . meclizine (ANTIVERT) 25 MG tablet Take 1 tablet (25 mg total) by mouth 3 (three) times daily as needed for dizziness. 30 tablet 0  . Menthol, Topical Analgesic, (BIOFREEZE EX) Apply 1 application topically as needed (for  pain).     . ondansetron (ZOFRAN) 8 MG tablet Take 1 tablet (8 mg total) by mouth every 8 (eight) hours as needed for nausea or vomiting. 30 tablet 2  . OXYGEN Inhale 4 L into the lungs continuous.    . potassium chloride SA (KLOR-CON) 20 MEQ tablet Take 1 tablet (20 mEq total) by mouth daily. 90 tablet 0  . rosuvastatin (CRESTOR) 10 MG tablet Take 1 tablet by mouth once daily 90 tablet 1  . Selexipag (UPTRAVI) 1600 MCG TABS Take 1,600 mcg by mouth 2 (two) times daily.    . sildenafil (REVATIO) 20 MG tablet Take 1 tablet (20 mg total) by mouth 3 (three) times daily. 270 tablet 3  . spironolactone (ALDACTONE) 25 MG tablet Take 1 tablet (25 mg total) by mouth at bedtime. Please call for a office visit [phone] 90 tablet 0  . torsemide (DEMADEX) 20 MG tablet Take by mouth. 3 tablets in the morning and 2 tablets in the afternoon    . dicyclomine (BENTYL) 20 MG tablet Take 1 tablet (20 mg total) by mouth every 6 (six) hours as needed (diarrhea, abdominal pain). (Patient not taking: Reported on 01/02/2020) 20 tablet 0  . torsemide (DEMADEX) 20 MG tablet TAKE 3 TABLETS BY MOUTH TWICE DAILY  (Patient taking differently: Pt taking 3 in morning and 2 in afternoon) 540 tablet 0   No current facility-administered medications for this visit.    Allergies:   Aspirin, Other, and Penicillins    Social History:  The patient  reports that she quit smoking about 29 years ago. Her smoking use included cigarettes. She has a 0.60 pack-year smoking history. She has never used smokeless tobacco. She reports that she does not drink alcohol and does not use drugs.   Family History:  The patient's family history includes Bone cancer in her sister; Breast cancer in her maternal aunt and sister; COPD in her mother; Heart disease in her mother; Hypertension in her father, mother, sister, and sister; Thyroid disease in her mother, sister, and sister.    ROS:  Please see the history of present illness.   Otherwise, review of systems are positive for none.   All other systems are reviewed and negative.    PHYSICAL EXAM: VS:  BP 110/60 (BP Location: Left Arm, Patient Position: Sitting, Cuff Size: Normal)   Pulse 66   Ht 5' (1.524 m)   Wt 181 lb 6 oz (82.3 kg)   SpO2 91% Comment: on 4LO2  BMI 35.42 kg/m  , BMI Body mass index is 35.42 kg/m. GEN: Well nourished, well developed, in no acute distress  HEENT: normal  Neck: no JVD, carotid bruits, or masses Cardiac: RRR; no  rubs, or gallops, 2/ 6 systolic flow murmur in the aortic area. Chronic left leg edema. Respiratory:  clear to auscultation bilaterally, normal work of breathing GI: soft, nontender, nondistended, + BS MS: no deformity or atrophy  Skin: warm and dry, no rash Neuro:  Strength and sensation are intact Psych: euthymic mood, full affect   EKG:  EKG is ordered today. The ekg ordered today demonstrates AV sequential pacemaker  Recent Labs: 02/03/2019: TSH 1.19 08/01/2019: ALT 10 11/29/2019: BUN 28; Creatinine, Ser 1.19; Hemoglobin 9.1; Platelets 191; Potassium 4.1; Sodium 141    Lipid Panel    Component Value Date/Time   CHOL  100 02/03/2019 1413   CHOL 137 02/03/2013 0441   TRIG 121.0 02/03/2019 1413   TRIG 197 02/03/2013 0441   HDL 51.00 02/03/2019  1413   HDL 25 (L) 02/03/2013 0441   CHOLHDL 2 02/03/2019 1413   VLDL 24.2 02/03/2019 1413   VLDL 39 02/03/2013 0441   LDLCALC 25 02/03/2019 1413   LDLCALC 73 02/03/2013 0441      Wt Readings from Last 3 Encounters:  01/26/20 181 lb 6 oz (82.3 kg)  01/25/20 185 lb (83.9 kg)  01/02/20 179 lb (81.2 kg)        ASSESSMENT AND PLAN:  1.  Chronic diastolic heart failure: The patient continues to have weight loss and diarrhea. No evidence of volume overload. I decrease torsemide to 40 mg twice daily.  2.  Severe pulmonary hypertension: Currently on sildenafil.    3. Essential hypertension: Blood pressure is well controlled on current medications.  4. Status post dual-chamber pacemaker placement: Followed by Dr. Caryl Comes.  5.  Persistent atrial fibrillation: Seems to be in sinus rhythm now off amiodarone which was discontinued due to suspected lung toxicity. Continue anticoagulation with Eliquis.  6. Weight loss and postprandial diarrhea: The patient was evaluated by GI and she might need to go to [facility] for further evaluation. If no clear explanation for her symptoms, we should consider stopping digoxin as it might be causing some of her GI symptoms.   Disposition: Follow-up in 4 months.  Signed,  [name], MD  01/26/2020 3:09 PM    [place]

## 2020-01-26 NOTE — Patient Instructions (Signed)
Medication Instructions:  Your physician has recommended you make the following change in your medication:   REDUCE Torsemide to 40 mg (2 tablets) twice daily.  *If you need a refill on your cardiac medications before your next appointment, please call your pharmacy*   Lab Work: None ordered If you have labs (blood work) drawn today and your tests are completely normal, you will receive your results only by:  [place] (if you have MyChart) OR  A paper copy in the mail If you have any lab test that is abnormal or we need to change your treatment, we will call you to review the results.   Testing/Procedures: None ordered   Follow-Up: At [facility], you and your health needs are our priority.  As part of our continuing mission to provide you with exceptional heart care, we have created designated Provider Care Teams.  These Care Teams include your primary Cardiologist (physician) and Advanced Practice Providers (APPs -  Physician Assistants and Nurse Practitioners) who all work together to provide you with the care you need, when you need it.  We recommend signing up for the patient portal called "MyChart".  Sign up information is provided on this After Visit Summary.  MyChart is used to connect with patients for Virtual Visits (Telemedicine).  Patients are able to view lab/test results, encounter notes, upcoming appointments, etc.  Non-urgent messages can be sent to your provider as well.   To learn more about what you can do with MyChart, go to [email or web address].    Your next appointment:   4 month(s)  The format for your next appointment:   In Person  Provider:    You may see [name], MD or one of the following Advanced Practice Providers on your designated Care Team:    Murray Hodgkins, NP  Christell Faith, PA-C  Marrianne Mood, PA-C    Other Instructions N/A

## 2020-01-30 ENCOUNTER — Inpatient Hospital Stay: Payer: Medicare HMO | Attending: Internal Medicine

## 2020-01-30 ENCOUNTER — Other Ambulatory Visit: Payer: Self-pay

## 2020-01-30 ENCOUNTER — Inpatient Hospital Stay (HOSPITAL_BASED_OUTPATIENT_CLINIC_OR_DEPARTMENT_OTHER): Payer: Medicare HMO | Admitting: Internal Medicine

## 2020-01-30 ENCOUNTER — Inpatient Hospital Stay: Payer: Medicare HMO

## 2020-01-30 VITALS — BP 105/55 | HR 59 | Temp 97.7°F | Resp 18 | Ht 60.0 in | Wt 182.0 lb

## 2020-01-30 VITALS — BP 115/64 | HR 60 | Temp 97.7°F | Resp 18

## 2020-01-30 DIAGNOSIS — Z803 Family history of malignant neoplasm of breast: Secondary | ICD-10-CM | POA: Diagnosis not present

## 2020-01-30 DIAGNOSIS — Z8679 Personal history of other diseases of the circulatory system: Secondary | ICD-10-CM | POA: Insufficient documentation

## 2020-01-30 DIAGNOSIS — E611 Iron deficiency: Secondary | ICD-10-CM

## 2020-01-30 DIAGNOSIS — Z86718 Personal history of other venous thrombosis and embolism: Secondary | ICD-10-CM | POA: Insufficient documentation

## 2020-01-30 DIAGNOSIS — Z87891 Personal history of nicotine dependence: Secondary | ICD-10-CM | POA: Insufficient documentation

## 2020-01-30 DIAGNOSIS — N183 Chronic kidney disease, stage 3 unspecified: Secondary | ICD-10-CM | POA: Diagnosis not present

## 2020-01-30 DIAGNOSIS — D509 Iron deficiency anemia, unspecified: Secondary | ICD-10-CM | POA: Insufficient documentation

## 2020-01-30 DIAGNOSIS — I130 Hypertensive heart and chronic kidney disease with heart failure and stage 1 through stage 4 chronic kidney disease, or unspecified chronic kidney disease: Secondary | ICD-10-CM | POA: Insufficient documentation

## 2020-01-30 DIAGNOSIS — E538 Deficiency of other specified B group vitamins: Secondary | ICD-10-CM | POA: Insufficient documentation

## 2020-01-30 LAB — CBC WITH DIFFERENTIAL/PLATELET
Abs Immature Granulocytes: 0.01 10*3/uL (ref 0.00–0.07)
Basophils Absolute: 0 10*3/uL (ref 0.0–0.1)
Basophils Relative: 1 %
Eosinophils Absolute: 0.2 10*3/uL (ref 0.0–0.5)
Eosinophils Relative: 4 %
HCT: 30.9 % — ABNORMAL LOW (ref 36.0–46.0)
Hemoglobin: 9.4 g/dL — ABNORMAL LOW (ref 12.0–15.0)
Immature Granulocytes: 0 %
Lymphocytes Relative: 31 %
Lymphs Abs: 1.5 10*3/uL (ref 0.7–4.0)
MCH: 27.1 pg (ref 26.0–34.0)
MCHC: 30.4 g/dL (ref 30.0–36.0)
MCV: 89 fL (ref 80.0–100.0)
Monocytes Absolute: 0.3 10*3/uL (ref 0.1–1.0)
Monocytes Relative: 7 %
Neutro Abs: 2.7 10*3/uL (ref 1.7–7.7)
Neutrophils Relative %: 57 %
Platelets: 183 10*3/uL (ref 150–400)
RBC: 3.47 MIL/uL — ABNORMAL LOW (ref 3.87–5.11)
RDW: 13 % (ref 11.5–15.5)
WBC: 4.7 10*3/uL (ref 4.0–10.5)
nRBC: 0 % (ref 0.0–0.2)

## 2020-01-30 LAB — BASIC METABOLIC PANEL
Anion gap: 10 (ref 5–15)
BUN: 26 mg/dL — ABNORMAL HIGH (ref 8–23)
CO2: 26 mmol/L (ref 22–32)
Calcium: 8.6 mg/dL — ABNORMAL LOW (ref 8.9–10.3)
Chloride: 103 mmol/L (ref 98–111)
Creatinine, Ser: 1.13 mg/dL — ABNORMAL HIGH (ref 0.44–1.00)
GFR calc Af Amer: 55 mL/min — ABNORMAL LOW (ref >60)
GFR calc non Af Amer: 47 mL/min — ABNORMAL LOW (ref >60)
Glucose, Bld: 97 mg/dL (ref 70–99)
Potassium: 3.8 mmol/L (ref 3.5–5.1)
Sodium: 139 mmol/L (ref 135–145)

## 2020-01-30 MED ORDER — IRON SUCROSE 20 MG/ML IV SOLN
200.0000 mg | Freq: Once | INTRAVENOUS | Status: AC
  Administered 2020-01-30: 200 mg via INTRAVENOUS
  Filled 2020-01-30: qty 10

## 2020-01-30 MED ORDER — CYANOCOBALAMIN 1000 MCG/ML IJ SOLN
1000.0000 ug | Freq: Once | INTRAMUSCULAR | Status: AC
  Administered 2020-01-30: 1000 ug via INTRAMUSCULAR
  Filled 2020-01-30: qty 1

## 2020-01-30 MED ORDER — SODIUM CHLORIDE 0.9 % IV SOLN
Freq: Once | INTRAVENOUS | Status: AC
  Filled 2020-01-30: qty 250

## 2020-01-30 NOTE — Progress Notes (Signed)
[place] NOTE  Patient Care Team: [name], MD as PCP - General (Family Medicine) Wellington Hampshire, MD as PCP - Cardiology (Cardiology) Deboraha Sprang, MD as PCP - Electrophysiology (Cardiology) Thom Chimes, MD as Referring Physician (Allergy) Juanito Doom, MD as Referring Physician (Internal Medicine) De Hollingshead, [facility] as Pharmacist (Pharmacist) Cammie Sickle, MD as Consulting Physician (Hematology and Oncology) Jon Billings, RN as [place] Management Anemia CHIEF COMPLAINTS/PURPOSE OF CONSULTATION: Anemia   HEMATOLOGY HISTORY  # IRON DEFICIENCY ANEMIA-Jan today 21 PCP Hb-8.8; Antonieta Pert sat-5%; ferritin 11] EGD-; colonoscopy MARCH 2020 -polyp [[facility]-hold further work-up secondary to cardiac issues]; remote-EGD-?;  February 2021-LDH/haptoglobin normal; kappa lambda light chain ratio/M panel normal;  on Venofer; November 2020-CT scan question early cirrhosis/no splenomegaly.  No acute process; MARCH 2021- STOOL OCCULT x3- NEGATIVE.   #B12 deficiency-IM every 3 to 4 months  #2014 -question blood transfusion  [patient admitted for stroke]; CHF chronic -[ 2004 on 4 lits; Dr.Arida/Klein]; CT scan chest 2021 -no adenopathy; fibrotic changes; walker; CKD stage III-   #Incidental right axillary mass-s/p US biopsy [PCP] -negative for malignancy.  HISTORY OF PRESENTING ILLNESS:  Debra Saunders 74 y.o.  female anemia/likely secondary to CKD stage III is here for follow-up.  Patient continues to have shortness of breath with exertion.  Continues to have chronic mild swelling in legs.  Continues with oxygen.  No blood in stools or blood per stool.  Review of Systems  Constitutional: Positive for malaise/fatigue. Negative for chills, diaphoresis, fever and weight loss.  HENT: Negative for nosebleeds and sore throat.   Eyes: Negative for double vision.  Respiratory: Positive for shortness of breath. Negative for  cough, hemoptysis, sputum production and wheezing.   Cardiovascular: Negative for chest pain, palpitations, orthopnea and leg swelling.  Gastrointestinal: Negative for abdominal pain, blood in stool, constipation, diarrhea, heartburn, melena, nausea and vomiting.  Genitourinary: Negative for dysuria, frequency and urgency.  Musculoskeletal: Negative for back pain and joint pain.  Skin: Negative.  Negative for itching and rash.  Neurological: Negative for dizziness, tingling, focal weakness, weakness and headaches.  Endo/Heme/Allergies: Does not bruise/bleed easily.  Psychiatric/Behavioral: Negative for depression. The patient is not nervous/anxious and does not have insomnia.     MEDICAL HISTORY:  Past Medical History:  Diagnosis Date  . Arthritis   . Atrial fibrillation, persistent ([place])    a. s/p TEE-guided DCCV on 08/20/2017  . Cardiorenal syndrome    a. 09/2017 Creat rose to 2.7 w/ diuresis-->HD x 2.  . Chest pain    a. 01/2012 Cath: nonobs dzs;  c. 04/2014 MV: No ischemia.  . Chronic back pain   . Chronic combined systolic (congestive) and diastolic (congestive) heart failure ([place])    a. Dx 2005 @ [facility];  b. 02/2014 Echo: EF 55-60%; c. 07/2017: EF 30-35%, diffuse HK, trivial AI, severe MR, moderate TR; d. 09/2017 Echo: EF 40-45%; e. 02/2018 Echo: EF 55-60%, Gr1 DD; f. 08/2019 Echo: EF 60-65%, Gr2 DD, Nl RV fxn, RVSP 49.83mmHg. Mod-Sev MR. Mild to mod TR. Mod PR. RA ~ 53mmHg.  [name] Chronic respiratory failure (HCC)    a. on home O2  . COPD (chronic obstructive pulmonary disease) ([place])    a. Home O2 - 3lpm  . Gastritis   . History of intracranial hemorrhage    a. 6/14 described by neurosurgery as "small frontal posttraumatic SAH " - pt was on xarelto @ the time.  . Hyperlipidemia   .  Hypertension   . Interstitial lung disease ([place])   . Lipoma of back   . Lymphedema    a. Chronic LLE edema.  [name] NICM (nonischemic cardiomyopathy) ([place])    a. 09/2017 Echo: EF 40-45%; b. 09/2017 Cath: minor,  insignificant CAD; c. 02/2018 Echo: EF 55-60%, Gr1 DD; d. 08/2019 Echo: EF 60-65%, Gr2 DD.  [name] Obesity   . PAH (pulmonary artery hypertension) ([place])    a. 09/2017 Echo: PASP 73mmHg; b. 09/2017 RHC: RA 23, RV 84/13, PA 84/29, PCWP 20, CO 3.89, CI 1.89. LVEDP 18.  . Sepsis with metabolic encephalopathy ([place])   . Sleep apnea   . Streptococcal bacteremia   . Stroke ([place])   . Subarachnoid hemorrhage ([place]) 01/19/2013  . Symptomatic bradycardia    a. s/p BSX PPM.  . Thyroid nodule   . Type II diabetes mellitus ([place])   . Urinary incontinence     SURGICAL HISTORY: Past Surgical History:  Procedure Laterality Date  . ABDOMINAL HYSTERECTOMY  1969  . BREAST BIOPSY Right 10/28/2019   lymph node bx, [place] Bx, pending path   . CARDIAC CATHETERIZATION  2013   @ [place]: No obstructive CAD: Only 20% ostial left circumflex.  [name] CARDIOVERSION N/A 10/12/2017   Procedure: CARDIOVERSION;  Surgeon: Minna Merritts, MD;  Location: [facility] ORS;  Service: Cardiovascular;  Laterality: N/A;  . CHOLECYSTECTOMY    . COLONOSCOPY WITH PROPOFOL N/A 09/10/2018   Procedure: COLONOSCOPY WITH PROPOFOL;  Surgeon: Manya Silvas, MD;  Location: [facility] ENDOSCOPY;  Service: Endoscopy;  Laterality: N/A;  . CYST REMOVAL TRUNK  2002   BACK  . DIALYSIS/PERMA CATHETER INSERTION N/A 10/06/2017   Procedure: DIALYSIS/PERMA CATHETER INSERTION;  Surgeon: Katha Cabal, MD;  Location: [place] CV LAB;  Service: Cardiovascular;  Laterality: N/A;  . INSERT / REPLACE / REMOVE PACEMAKER    . lipoma removal  2014   back  . PACEMAKER INSERTION  [address] Scientific dual chamber pacemaker implanted by Dr Caryl Comes for symptomatic bradycardia  . PERMANENT PACEMAKER INSERTION N/A 01/09/2014   Procedure: PERMANENT PACEMAKER INSERTION;  Surgeon: Deboraha Sprang, MD;  Location: [facility] CATH LAB;  Service: Cardiovascular;  Laterality: N/A;  . RIGHT HEART CATH N/A 04/19/2018   Procedure: RIGHT HEART CATH;  Surgeon: Larey Dresser, MD;  Location: [place] CV LAB;  Service: Cardiovascular;  Laterality: N/A;  . RIGHT/LEFT HEART CATH AND CORONARY ANGIOGRAPHY N/A 10/19/2017   Procedure: RIGHT/LEFT HEART CATH AND CORONARY ANGIOGRAPHY;  Surgeon: Wellington Hampshire, MD;  Location: [place] CV LAB;  Service: Cardiovascular;  Laterality: N/A;  . TEE WITHOUT CARDIOVERSION N/A 08/20/2017   Procedure: TRANSESOPHAGEAL ECHOCARDIOGRAM (TEE) & Direct current cardioversion;  Surgeon: Minna Merritts, MD;  Location: [facility] ORS;  Service: Cardiovascular;  Laterality: N/A;  . TRIGGER FINGER RELEASE      SOCIAL HISTORY: Social History   Socioeconomic History  . Marital status: Divorced    Spouse name: Not on file  . Number of children: 4  . Years of education: 21  . Highest education level: High school graduate  Occupational History  . Occupation: Retired- factory work    Comment: exposed to Pharmacist, community  Tobacco Use  . Smoking status: Former Smoker    Packs/day: 0.30    Years: 2.00    Pack years: 0.60    Types: Cigarettes    Quit date: 06/23/1990    Years since quitting: 29.6  . Smokeless tobacco: Never Used  Vaping Use  . Vaping  Use: Never used  Substance and Sexual Activity  . Alcohol use: No  . Drug use: No  . Sexual activity: Not Currently  Other Topics Concern  . Not on file  Social History Narrative   Lives in [place] with daughter. Has 4 children. No pets.      Work - Restaurant manager, fast food, retired      Diet - regular      02/14/2019: reports has Meals On Wheels delivered to her home;       Remote hx of smoking > 30 years; No alcohol-    Social Determinants of Health   Financial Resource Strain: Low Risk   . Difficulty of Paying Living Expenses: Not very hard  Food Insecurity:   . Worried About Charity fundraiser in the Last Year:   . Arboriculturist in the Last Year:   Transportation Needs: Unmet Transportation Needs  . Lack of Transportation (Medical): Yes  . Lack of Transportation (Non-Medical): No  Physical Activity:    . Days of Exercise per Week:   . Minutes of Exercise per Session:   Stress:   . Feeling of Stress :   Social Connections:   . Frequency of Communication with Friends and Family:   . Frequency of Social Gatherings with Friends and Family:   . Attends Religious Services:   . Active Member of Clubs or Organizations:   . Attends Archivist Meetings:   [name] Marital Status:   Intimate Partner Violence:   . Fear of Current or Ex-Partner:   . Emotionally Abused:   [name] Physically Abused:   . Sexually Abused:     FAMILY HISTORY: Family History  Problem Relation Age of Onset  . Heart disease Mother   . Hypertension Mother   . COPD Mother        was a smoker  . Thyroid disease Mother   . Hypertension Father   . Hypertension Sister   . Thyroid disease Sister   . Breast cancer Sister   . Hypertension Sister   . Thyroid disease Sister   . Bone cancer Sister   . Breast cancer Maternal Aunt   . Kidney disease Neg Hx   . Bladder Cancer Neg Hx     ALLERGIES:  is allergic to aspirin, other, and penicillins.  MEDICATIONS:  Current Outpatient Medications  Medication Sig Dispense Refill  . acetaminophen (TYLENOL) 325 MG tablet Take 325-650 mg by mouth every 6 (six) hours as needed for mild pain. Reported on 10/10/2015    . albuterol (PROVENTIL) (2.5 MG/3ML) 0.083% nebulizer solution Take 3 mLs (2.5 mg total) by nebulization every 6 (six) hours. (Patient taking differently: Take 2.5 mg by nebulization every 6 (six) hours as needed. ) 75 mL 12  . apixaban (ELIQUIS) 5 MG TABS tablet Take 1 tablet (5 mg total) by mouth 2 (two) times daily. Please call for office visit [phone] 60 tablet 0  . digoxin (LANOXIN) 0.125 MG tablet Take 1 tablet by mouth once daily 90 tablet 3  . Dulaglutide ([name]) 5.62 ZH/0.8MV SOPN Inject 0.5 mLs (0.75 mg total) into the skin once a week. 4 pen 0  . gabapentin (NEURONTIN) 100 MG capsule TAKE 2 CAPSULES BY MOUTH THREE TIMES DAILY 180 capsule 0  .  glucose blood test strip Use with True Metrix glucometer to check sugars BID 200 each 3  . Lancets MISC Use with True Metrix glucometer to check sugars BID 200 each 3  . macitentan (OPSUMIT) 10  MG tablet Take 1 tablet (10 mg total) by mouth daily. 30 tablet 5  . meclizine (ANTIVERT) 25 MG tablet Take 1 tablet (25 mg total) by mouth 3 (three) times daily as needed for dizziness. 30 tablet 0  . Menthol, Topical Analgesic, (BIOFREEZE EX) Apply 1 application topically as needed (for pain).     . ondansetron (ZOFRAN) 8 MG tablet Take 1 tablet (8 mg total) by mouth every 8 (eight) hours as needed for nausea or vomiting. 30 tablet 2  . OXYGEN Inhale 4 L into the lungs continuous.    . potassium chloride SA (KLOR-CON) 20 MEQ tablet Take 1 tablet (20 mEq total) by mouth daily. 90 tablet 0  . rosuvastatin (CRESTOR) 10 MG tablet Take 1 tablet by mouth once daily 90 tablet 1  . Selexipag (UPTRAVI) 1600 MCG TABS Take 1,600 mcg by mouth 2 (two) times daily.    . sildenafil (REVATIO) 20 MG tablet Take 1 tablet (20 mg total) by mouth 3 (three) times daily. 270 tablet 3  . spironolactone (ALDACTONE) 25 MG tablet Take 1 tablet (25 mg total) by mouth at bedtime. Please call for a office visit [phone] 90 tablet 0  . torsemide (DEMADEX) 20 MG tablet Take 2 tablets (40 mg total) by mouth 2 (two) times daily.    [name] dicyclomine (BENTYL) 20 MG tablet Take 1 tablet (20 mg total) by mouth every 6 (six) hours as needed (diarrhea, abdominal pain). (Patient not taking: Reported on 01/02/2020) 20 tablet 0   No current facility-administered medications for this visit.      PHYSICAL EXAMINATION:   Vitals:   01/30/20 1301  BP: (!) 105/55  Pulse: (!) 59  Resp: 18  Temp: 97.7 F (36.5 C)  SpO2: 100%   Filed Weights   01/30/20 1301  Weight: 182 lb (82.6 kg)    Physical Exam Constitutional:      Comments: Patient in wheelchair.  Alone.  She is on O2 nasal cannula.  HENT:     Head: Normocephalic and atraumatic.      Mouth/Throat:     Pharynx: No oropharyngeal exudate.  Eyes:     Pupils: Pupils are equal, round, and reactive to light.  Cardiovascular:     Rate and Rhythm: Normal rate and regular rhythm.  Pulmonary:     Effort: No respiratory distress.     Breath sounds: No wheezing.  Abdominal:     General: Bowel sounds are normal. There is no distension.     Palpations: Abdomen is soft. There is no mass.     Tenderness: There is no abdominal tenderness. There is no guarding or rebound.  Musculoskeletal:        General: Swelling present. No tenderness. Normal range of motion.     Cervical back: Normal range of motion and neck supple.  Skin:    General: Skin is warm.  Neurological:     Mental Status: She is alert and oriented to person, place, and time.  Psychiatric:        Mood and Affect: Affect normal.     LABORATORY DATA:  I have reviewed the data as listed Lab Results  Component Value Date   WBC 4.7 01/30/2020   HGB 9.4 (L) 01/30/2020   HCT 30.9 (L) 01/30/2020   MCV 89.0 01/30/2020   PLT 183 01/30/2020   Recent Labs    02/03/19 1413 02/03/19 1413 07/08/19 1504 07/08/19 1504 08/01/19 1524 11/29/19 1244 01/30/20 1127  NA 138   < > 139   < >  137 141 139  K 4.1   < > 4.4   < > 3.8 4.1 3.8  CL 99   < > 101   < > 99 104 103  CO2 30   < > 28   < > 28 30 26   GLUCOSE 114*   < > 102*   < > 94 106* 97  BUN 26*   < > 26*   < > 24* 28* 26*  CREATININE 1.39*   < > 1.39*  --  1.16* 1.19* 1.13*  CALCIUM 9.7   < > 9.4   < > 9.3 8.8* 8.6*  GFRNONAA  --   --   --   --  46* 44* 47*  GFRAA  --   --   --   --  53* 51* 55*  PROT 7.6  --  7.4  --  8.1  --   --   ALBUMIN 4.1  --   --   --  4.0  --   --   AST 15  --  11  --  15  --   --   ALT 7  --  5*  --  10  --   --   ALKPHOS 111  --   --   --  124  --   --   BILITOT 0.4  --  0.4  --  0.5  --   --   BILIDIR 0.1  --   --   --   --   --   --    < > = values in this interval not displayed.     No results found.  Iron  deficiency #Anemia-iron deficiency/CKD stage III- currently s/p Venofe.hemoglobin today is 9.4 improved from around 8.  Proceed with Venofer today.   #Given the likely etiology of CKD-discussed regarding use of erythropoietin stimulating agent. Discussed use of erythropoietin stimulating agents like Aranesp to stimulate the bone marrow.  Discussed the potential issues with erythropoietin estimating agents-given the risk of stroke thromboembolic events/elevated blood pressure.  However, most of the serious events did not happen when the goal hematocrit is 33/hemoglobin 30.  Will consider at next visit/ordered.   #Etiology-iron deficient is unclear.  Question CKD versus others.  Colonoscopy March 2020-polyps.  Stool occult-negative we will recheck at next visit.[name]  #CKD stage III-GFR 44 stable.  #History of DVT/s/p IVC filter; ?  A. Fib-Eliquis stable  #Borderline low B12-230; s/p B12 injection.  Continue B12 in 2-3 months.  # DISPOSITION:   # venofer/b12 today #  Follow up in 2 months-MD-labs- cbc;bmp;iron studies/ferritin; possible Venofer OR RETACRIT; B12 injection- Dr.B  All questions were answered. The patient knows to call the clinic with any problems, questions or concerns.    Cammie Sickle, MD 01/30/2020 1:30 PM

## 2020-01-30 NOTE — Assessment & Plan Note (Addendum)
#  Anemia-iron deficiency/CKD stage III- currently s/p Venofe.hemoglobin today is 9.4 improved from around 8.  Proceed with Venofer today.   #Given the likely etiology of CKD-discussed regarding use of erythropoietin stimulating agent. Discussed use of erythropoietin stimulating agents like Aranesp to stimulate the bone marrow.  Discussed the potential issues with erythropoietin estimating agents-given the risk of stroke thromboembolic events/elevated blood pressure.  However, most of the serious events did not happen when the goal hematocrit is 33/hemoglobin 30.  Will consider at next visit/ordered.   #Etiology-iron deficient is unclear.  Question CKD versus others.  Colonoscopy March 2020-polyps.  Stool occult-negative we will recheck at next visit.[name]  #CKD stage III-GFR 44 stable.  #History of DVT/s/p IVC filter; ?  A. Fib-Eliquis stable  #Borderline low B12-230; s/p B12 injection.  Continue B12 in 2-3 months.  # DISPOSITION:   # venofer/b12 today #  Follow up in 2 months-MD-labs- cbc;bmp;iron studies/ferritin; possible Venofer OR RETACRIT; B12 injection- Dr.B

## 2020-02-01 ENCOUNTER — Ambulatory Visit (INDEPENDENT_AMBULATORY_CARE_PROVIDER_SITE_OTHER): Payer: Medicare HMO | Admitting: Pharmacist

## 2020-02-01 DIAGNOSIS — R197 Diarrhea, unspecified: Secondary | ICD-10-CM

## 2020-02-01 DIAGNOSIS — E119 Type 2 diabetes mellitus without complications: Secondary | ICD-10-CM | POA: Diagnosis not present

## 2020-02-01 DIAGNOSIS — E1122 Type 2 diabetes mellitus with diabetic chronic kidney disease: Secondary | ICD-10-CM

## 2020-02-01 DIAGNOSIS — K529 Noninfective gastroenteritis and colitis, unspecified: Secondary | ICD-10-CM

## 2020-02-01 NOTE — Patient Instructions (Signed)
Visit Information  Goals Addressed              This Visit's Progress     Patient Stated   .  "I want to stay healthy" (pt-stated)        CARE PLAN ENTRY (see longtitudinal plan of care for additional care plan information)  Current Barriers:  . Financial, social, or community barriers:  o Reports her biggest concern today is her continued diarrhea. Spoke with her daughter, [name], as well. They note that transporting her to [facility] for an appointment at [facility] is not possible right now with [name]'s work schedule. Has not contacted [facility] about.  . Diabetes: controlled; most recent A1c 5.2% . Current antihyperglycemic regimen: [name] 7.42 mg weekly - denies increase in diarrhea/nausea, but does admit that she asked that we restart this to quell her appetite. Notes that she is hungry all of the time because of her diarrhea, and she didn't want to feel that way, so wanted to go back on GLP1 therapy to mask her hunger o APPROVED for [name] assistance through 06/22/20 . Current blood glucose readings:  o Fastings: 150-160s  . Cardiovascular risk reduction- followed extensively by heart failure clinic/paramedicine program; Dr. [name] CHF, Dr. Caryl Comes EP, Dr. Fletcher Anon primary cardiology; o Regular pill box fills from Paramedicine program.  o Current hypertensive/CHF regimen: digoxin 0.125 mg daily, Opsumit 10 mg daily, Uptravi 1600 mcg BID, Revatio 20 mg TID o Current hyperlipidemia regimen: rosuvastatin 10 mg daily; LDL at goal <70 on last check . Diarrhea: saw Effie Berkshire at [facility], was referred to [facility] because she is a high risk case for colonoscopy.   Pharmacist Clinical Goal(s):  [name] Over the next 90 days, patient with work with PharmD and primary care provider to address optimized medication management  Interventions: . Comprehensive medication review performed, medication list updated in electronic medical record . Inter-disciplinary care team collaboration (see  longitudinal plan of care) . Contacted [facility] to review that patient is not able to get to [facility] reliably so has not scheduled an appointment w/ [facility] specialist. Spoke with Luetta Nutting, RN w/ Effie Berkshire. They are going to contact patient's daughter to schedule f/u with a doctor at [facility] to discuss diarrhea and pursue appropriate work up or treatment option . Discussed with patient that we do not want to be "masking" any GI symptoms or concerns during GI work up. Also concern for weight loss over the past few months. Hold [name] while diarrhea is being worked up. A1c later this month, and we can determine if alternative pharmacotherapy is needed.  Patient Self Care Activities:  . Patient will check blood glucose BID, document, and provide at future appointments . Patient will focus on medication adherence by using pill boxes filled by paramedicine . Patient will report any questions or concerns to provider   Please see past updates related to this goal by clicking on the "Past Updates" button in the selected goal         The patient verbalized understanding of instructions provided today and declined a print copy of patient instruction materials.   Plan:  - Scheduled f/u call in ~ 5 weeks  [name], PharmD, [place], [place] Pharmacist [place] [phone]

## 2020-02-01 NOTE — Chronic Care Management (AMB) (Signed)
Chronic Care Management   Follow Up Note   02/01/2020 Name: Debra Saunders MRN: [id number] DOB: 06-Jan-1946  Referred by: [name], MD Reason for referral : Chronic Care Management (Medication Management)   Debra Saunders is a 74 y.o. year old female who is a primary care patient of Debra Saunders, Debra Adam, MD. The CCM team was consulted for assistance with chronic disease management and care coordination needs.    Contacted patient for medication management review.   Review of patient status, including review of consultants reports, relevant laboratory and other test results, and collaboration with appropriate care team members and the patient's provider was performed as part of comprehensive patient evaluation and provision of chronic care management services.    SDOH (Social Determinants of Health) assessments performed: No See Care Plan activities for detailed interventions related to [facility])     Outpatient Encounter Medications as of 02/01/2020  Medication Sig  . apixaban (ELIQUIS) 5 MG TABS tablet Take 1 tablet (5 mg total) by mouth 2 (two) times daily. Please call for office visit [phone]  . digoxin (LANOXIN) 0.125 MG tablet Take 1 tablet by mouth once daily  . gabapentin (NEURONTIN) 100 MG capsule TAKE 2 CAPSULES BY MOUTH THREE TIMES DAILY  . glucose blood test strip Use with True Metrix glucometer to check sugars BID  . Lancets MISC Use with True Metrix glucometer to check sugars BID  . macitentan (OPSUMIT) 10 MG tablet Take 1 tablet (10 mg total) by mouth daily.  . Menthol, Topical Analgesic, (BIOFREEZE EX) Apply 1 application topically as needed (for pain).   . potassium chloride SA (KLOR-CON) 20 MEQ tablet Take 1 tablet (20 mEq total) by mouth daily.  . rosuvastatin (CRESTOR) 10 MG tablet Take 1 tablet by mouth once daily  . Selexipag (UPTRAVI) 1600 MCG TABS Take 1,600 mcg by mouth 2 (two) times daily.  . sildenafil (REVATIO) 20 MG tablet Take 1 tablet (20 mg  total) by mouth 3 (three) times daily.  [name] spironolactone (ALDACTONE) 25 MG tablet Take 1 tablet (25 mg total) by mouth at bedtime. Please call for a office visit [phone]  . torsemide (DEMADEX) 20 MG tablet Take 2 tablets (40 mg total) by mouth 2 (two) times daily.  . [DISCONTINUED] Dulaglutide ([name]) 4.92 EF/0.0FH SOPN Inject 0.5 mLs (0.75 mg total) into the skin once a week.  [name] acetaminophen (TYLENOL) 325 MG tablet Take 325-650 mg by mouth every 6 (six) hours as needed for mild pain. Reported on 10/10/2015  . albuterol (PROVENTIL) (2.5 MG/3ML) 0.083% nebulizer solution Take 3 mLs (2.5 mg total) by nebulization every 6 (six) hours. (Patient taking differently: Take 2.5 mg by nebulization every 6 (six) hours as needed. )  . dicyclomine (BENTYL) 20 MG tablet Take 1 tablet (20 mg total) by mouth every 6 (six) hours as needed (diarrhea, abdominal pain). (Patient not taking: Reported on 01/02/2020)  . meclizine (ANTIVERT) 25 MG tablet Take 1 tablet (25 mg total) by mouth 3 (three) times daily as needed for dizziness. (Patient not taking: Reported on 02/01/2020)  . ondansetron (ZOFRAN) 8 MG tablet Take 1 tablet (8 mg total) by mouth every 8 (eight) hours as needed for nausea or vomiting. (Patient not taking: Reported on 02/01/2020)  . OXYGEN Inhale 4 L into the lungs continuous.   No facility-administered encounter medications on file as of 02/01/2020.     Objective:   Goals Addressed              This Visit's  Progress     Patient Stated   .  "I want to stay healthy" (pt-stated)        CARE PLAN ENTRY (see longtitudinal plan of care for additional care plan information)  Current Barriers:  . Financial, social, or community barriers:  o Reports her biggest concern today is her continued diarrhea. Spoke with her daughter, [name], as well. They note that transporting her to [facility] for an appointment at [facility] is not possible right now with [name]'s work schedule. Has not contacted [facility] about.  . Diabetes: controlled; most recent A1c 5.2% . Current antihyperglycemic regimen: [name] 2.95 mg weekly - denies increase in diarrhea/nausea, but does admit that she asked that we restart this to quell her appetite. Notes that she is hungry all of the time because of her diarrhea, and she didn't want to feel that way, so wanted to go back on GLP1 therapy to mask her hunger o APPROVED for [name] assistance through 06/22/20 . Current blood glucose readings:  o Fastings: 150-160s  . Cardiovascular risk reduction- followed extensively by heart failure clinic/paramedicine program; Dr. [name] CHF, Dr. Caryl Comes EP, Dr. Fletcher Anon primary cardiology; o Regular pill box fills from Paramedicine program.  o Current hypertensive/CHF regimen: digoxin 0.125 mg daily, Opsumit 10 mg daily, Uptravi 1600 mcg BID, Revatio 20 mg TID o Current hyperlipidemia regimen: rosuvastatin 10 mg daily; LDL at goal <70 on last check . Diarrhea: saw Effie Berkshire at [facility], was referred to [facility] because she is a high risk case for colonoscopy.   Pharmacist Clinical Goal(s):  [name] Over the next 90 days, patient with work with PharmD and primary care provider to address optimized medication management  Interventions: . Comprehensive medication review performed, medication list updated in electronic medical record . Inter-disciplinary care team collaboration (see longitudinal plan of care) . Contacted [facility] to review that patient is not able to get to [facility] reliably so has not scheduled an appointment w/ [facility] specialist. Spoke with Luetta Nutting, RN w/ Effie Berkshire. They are going to contact patient's daughter to schedule f/u with a doctor at [facility] to discuss diarrhea and pursue appropriate work up or treatment option . Discussed with patient that we do not want to be "masking" any GI symptoms or concerns during GI work up. Also concern for weight loss over the past few months. Hold [name] while diarrhea is being worked  up. A1c later this month, and we can determine if alternative pharmacotherapy is needed.  Patient Self Care Activities:  . Patient will check blood glucose BID, document, and provide at future appointments . Patient will focus on medication adherence by using pill boxes filled by paramedicine . Patient will report any questions or concerns to provider   Please see past updates related to this goal by clicking on the "Past Updates" button in the selected goal          Plan:  - Scheduled f/u call in ~ 5 weeks  [name], PharmD, [place], [place] Pharmacist [place] [phone]

## 2020-02-08 ENCOUNTER — Other Ambulatory Visit (INDEPENDENT_AMBULATORY_CARE_PROVIDER_SITE_OTHER): Payer: Medicare HMO

## 2020-02-08 ENCOUNTER — Other Ambulatory Visit: Payer: Self-pay

## 2020-02-08 DIAGNOSIS — E1122 Type 2 diabetes mellitus with diabetic chronic kidney disease: Secondary | ICD-10-CM | POA: Diagnosis not present

## 2020-02-08 LAB — POCT GLYCOSYLATED HEMOGLOBIN (HGB A1C)

## 2020-02-14 ENCOUNTER — Other Ambulatory Visit: Payer: Self-pay

## 2020-02-14 NOTE — Patient Outreach (Signed)
[facility]) Care Management  [place]  02/14/2020   Debra Saunders November 23, 1945 [id number]  Subjective:Telephone call to patient for disease management follow up. Patient reports she continues to have diarrhea daily.  She is unable to give number of episodes but just says it happens all day.  She reports that she will follow up with GI in October or sooner if an opening comes available.  Patient states she is unable to go to [facility] at this time so she will go to [place].  Discussed with patient diarrhea and remaining hydrated and eating small things. She states she eats applesauce regularly.  Advised patient to seek urgent  medical attention if diarrhea gets worse. She verbalized understanding. Patient reports that her blood pressures have been good with readings around 120/66.  Discussed blood pressure management with patient. She voices no concerns,    Objective:   Encounter Medications:  Outpatient Encounter Medications as of 02/14/2020  Medication Sig  . acetaminophen (TYLENOL) 325 MG tablet Take 325-650 mg by mouth every 6 (six) hours as needed for mild pain. Reported on 10/10/2015  . albuterol (PROVENTIL) (2.5 MG/3ML) 0.083% nebulizer solution Take 3 mLs (2.5 mg total) by nebulization every 6 (six) hours. (Patient taking differently: Take 2.5 mg by nebulization every 6 (six) hours as needed. )  . apixaban (ELIQUIS) 5 MG TABS tablet Take 1 tablet (5 mg total) by mouth 2 (two) times daily. Please call for office visit [phone]  . dicyclomine (BENTYL) 20 MG tablet Take 1 tablet (20 mg total) by mouth every 6 (six) hours as needed (diarrhea, abdominal pain). (Patient not taking: Reported on 01/02/2020)  . digoxin (LANOXIN) 0.125 MG tablet Take 1 tablet by mouth once daily  . gabapentin (NEURONTIN) 100 MG capsule TAKE 2 CAPSULES BY MOUTH THREE TIMES DAILY  . glucose blood test strip Use with True Metrix glucometer to check sugars BID  . Lancets MISC Use with  True Metrix glucometer to check sugars BID  . macitentan (OPSUMIT) 10 MG tablet Take 1 tablet (10 mg total) by mouth daily.  . meclizine (ANTIVERT) 25 MG tablet Take 1 tablet (25 mg total) by mouth 3 (three) times daily as needed for dizziness. (Patient not taking: Reported on 02/01/2020)  . Menthol, Topical Analgesic, (BIOFREEZE EX) Apply 1 application topically as needed (for pain).   . ondansetron (ZOFRAN) 8 MG tablet Take 1 tablet (8 mg total) by mouth every 8 (eight) hours as needed for nausea or vomiting. (Patient not taking: Reported on 02/01/2020)  . OXYGEN Inhale 4 L into the lungs continuous.  . potassium chloride SA (KLOR-CON) 20 MEQ tablet Take 1 tablet (20 mEq total) by mouth daily.  . rosuvastatin (CRESTOR) 10 MG tablet Take 1 tablet by mouth once daily  . Selexipag (UPTRAVI) 1600 MCG TABS Take 1,600 mcg by mouth 2 (two) times daily.  . sildenafil (REVATIO) 20 MG tablet Take 1 tablet (20 mg total) by mouth 3 (three) times daily.  [name] spironolactone (ALDACTONE) 25 MG tablet Take 1 tablet (25 mg total) by mouth at bedtime. Please call for a office visit [phone]  . torsemide (DEMADEX) 20 MG tablet Take 2 tablets (40 mg total) by mouth 2 (two) times daily.   No facility-administered encounter medications on file as of 02/14/2020.    Functional Status:  In your present state of health, do you have any difficulty performing the following activities: 02/14/2020 12/15/2019  Hearing? N N  Vision? N N  Difficulty concentrating  or making decisions? N N  Walking or climbing stairs? Y Y  Comment has ramp/weakness -  Dressing or bathing? N N  Doing errands, shopping? [name]  Comment daughter takes to appointments She does not Physiological scientist and eating ? N N  Using the Toilet? N N  In the past six months, have you accidently leaked urine? [name]  Comment wears depends Managed with daily brief  Do you have problems with loss of bowel control? - Y  Comment - Managed with daily brief. Plans  to schedule colonoscopy with [facility].  Managing your Medications? N Y  Comment paramedicine program q 2 weeks Community paramedic assist with managment/ pill box  Managing your Finances? N N  Housekeeping or managing your Housekeeping? Y N  Comment family helps -  Some recent data might be hidden    Fall/Depression Screening: Fall Risk  02/14/2020 12/15/2019 11/04/2019  Falls in the past year? 0 0 0  Comment - - -  Number falls in past yr: - 0 -  Comment - - -  Injury with Fall? - - -  Comment - - -  Risk for fall due to : - - -  Follow up - Falls evaluation completed Falls evaluation completed   PHQ 2/9 Scores 02/14/2020 12/15/2019 11/04/2019 09/27/2019 07/06/2019 02/14/2019 07/02/2018  PHQ - 2 Score 0 0 0 0 0 0 0  PHQ- 9 Score - - - - - - -    Assessment: Patient waiting GI follow up due to ongoing diarrhea.  Patient managing blood pressure and seeing doctors as scheduled.    Plan:  [facility] CM Care Plan Problem One     Most Recent Value  Care Plan Problem One Knowledge Deficit Hypertension  Role Documenting the Problem One Care Management Telephonic Coordinator  Care Plan for Problem One Active  THN Long Term Goal  Over the next 90 days, patient will demonstrate and/or verbalize understanding of self-health management for HTN.  THN Long Term Goal Start Date 02/14/20  Interventions for Problem One Long Term Goal Patient checking b/p at home.  Encouraged patient to continue to monitor and follow up with physician as scheduled.     RN CM will contact patient in the month of October and patient agreeable.    [name], RN, MSN [place] Management Care Management Coordinator Direct Line [phone] Cell [phone] Toll Free: [phone]  Fax: [phone]

## 2020-02-16 ENCOUNTER — Other Ambulatory Visit (HOSPITAL_COMMUNITY): Payer: Self-pay | Admitting: Cardiology

## 2020-02-16 ENCOUNTER — Other Ambulatory Visit (HOSPITAL_COMMUNITY): Payer: Self-pay

## 2020-02-16 ENCOUNTER — Encounter (HOSPITAL_COMMUNITY): Payer: Self-pay

## 2020-02-16 NOTE — Progress Notes (Signed)
Today had a home visit with [name].  She states she is tired and hurting in her ears and head today.  She states she is ok though.  She states she has pain all the way across her chest also.  She states feels like muscle.  She just got up.  She states her diarrhea was really bad on Sunday she was very weak.  But she had not drank anything and once she drank she felt better.  Advised her on days she has bad diarrhea she needs to replace those fluids she lost.  She says she only drinks about 20 ounces on those days because she wants it to stop.  Advised her to drink about 60 ounces on normal days and more on sick days.  She has not weighed today, she states her weight is going up but she would not tell me.  She has a little more swelling than normal in lower extremities.  She denies shortness of breath or abdominal tightness.  She states she has appt with GI in [place] in October.  She is aware of up coming appts.   She has all her medications.  Called in refills for uptravi, gabapentin and eliquis.  Refilled her boxes for her.  She is able to take her medication and refill her boxes but she states she gets lazy and wants me to do it.  Advised her that is just fine.  She appears to taking everything by her boxes and med bottles.  She stays at home other than going to doctors.  She does not like to bother her family.  She appears a little depressed today, she states she would like to go out and do things.  I told her to ask her daughter or grandkids.  She states her son is coming in for a visit and she talked about that.  She states Dr [name] advised her that she did not need to see him since seen Cardiology in [place], will check and make sure.  Her refill on her meds state need an appt.  She does not like to out of town to appts because of bothering her family.  We discussed food and liquids.  She is eating yogurt and drinking pedialyte per sonnerberg orders.  Will continue to visit for heart failur, diet and  medication compliance.   [place] [phone]

## 2020-02-28 ENCOUNTER — Ambulatory Visit (INDEPENDENT_AMBULATORY_CARE_PROVIDER_SITE_OTHER): Payer: Medicare HMO | Admitting: *Deleted

## 2020-02-28 DIAGNOSIS — I5032 Chronic diastolic (congestive) heart failure: Secondary | ICD-10-CM | POA: Diagnosis not present

## 2020-02-28 DIAGNOSIS — I480 Paroxysmal atrial fibrillation: Secondary | ICD-10-CM

## 2020-02-29 ENCOUNTER — Telehealth: Payer: Medicare HMO

## 2020-02-29 LAB — CUP PACEART REMOTE DEVICE CHECK
Battery Remaining Longevity: 78 mo
Battery Remaining Percentage: 84 %
Brady Statistic RA Percent Paced: 72 %
Brady Statistic RV Percent Paced: 99 %
Date Time Interrogation Session: 20210908120900
Implantable Lead Implant Date: 20150720
Implantable Lead Implant Date: 20150720
Implantable Lead Location: 753859
Implantable Lead Location: 753860
Implantable Lead Model: 5076
Implantable Lead Model: 5076
Implantable Pulse Generator Implant Date: 20150720
Lead Channel Impedance Value: 372 Ohm
Lead Channel Impedance Value: 538 Ohm
Lead Channel Pacing Threshold Amplitude: 0.8 V
Lead Channel Pacing Threshold Pulse Width: 0.4 ms
Lead Channel Setting Pacing Amplitude: 2 V
Lead Channel Setting Pacing Amplitude: 2.4 V
Lead Channel Setting Pacing Pulse Width: 0.4 ms
Lead Channel Setting Sensing Sensitivity: 2.5 mV
Pulse Gen Serial Number: 390330

## 2020-02-29 NOTE — Progress Notes (Signed)
Remote pacemaker transmission.   

## 2020-03-01 ENCOUNTER — Telehealth (HOSPITAL_COMMUNITY): Payer: Self-pay

## 2020-03-01 NOTE — Telephone Encounter (Signed)
Had a telephone appt today with Debra Saunders.  She advised that she had some chills like a fever and some shortness of breath this week.  She refuses to go to doctor.  She states her normal sats are 98% and they are 95%.  Advised her if gets worse she needs to get checked.  She has all her medications and aware of how to take them.  She states still have diarrhea and not hungry any.  She states she tries to eat the bland diet but nothing helps.  She is still refusing to go to GI doctor in [place], she can get her insurance to provide transportation.  She states she is tired during the day and can not sleep at night.  She sleeps from around 3 am to noon.  Advised her to try to stay awake in the day to sleep at night.  Looked through her medications and the only one could make her tired during the day is gabapentin but she takes it at night also.  Advised her to talk with her doctor.  She denies any chest pain, headaches.  She states shortness of breath started about 4 days ago, mostly with exertion but a little at rest.  She states she will go get checked if worsens.  She states she has not weighed, but she will.  She states no more swelling in legs than normal.  She states been drinking plenty but not too much.  She states she does not feel she has gained any.  Will visit for heart failure and medication compliance.   [place] [phone]

## 2020-03-08 ENCOUNTER — Telehealth: Payer: Self-pay | Admitting: Family Medicine

## 2020-03-12 ENCOUNTER — Other Ambulatory Visit: Payer: Self-pay

## 2020-03-12 MED ORDER — GABAPENTIN 100 MG PO CAPS: 200.0000 mg | ORAL_CAPSULE | Freq: Three times a day (TID) | ORAL | 0 refills | Status: DC

## 2020-03-12 NOTE — Telephone Encounter (Signed)
gabapentin (NEURONTIN) 100 MG capsule, sorry forgot to put in last note which medication.

## 2020-03-12 NOTE — Telephone Encounter (Signed)
Patient only has enough  meidcation for today.

## 2020-03-13 ENCOUNTER — Other Ambulatory Visit (HOSPITAL_COMMUNITY): Payer: Self-pay

## 2020-03-13 NOTE — Progress Notes (Signed)
Had a home visit with Cabela today.   She states doing ok, she has ran out of her gabapentin and trying to get a family member to pick it up.  She states is hurting today.  She states appetite is same, she eats small meals.  She states still has diarrhea all the time.  She has enough medications for 1 week to place in med box.  Called in refills for rosevastatin and her gabapentin.  She watches high sodium foods.  She tries to drink enough fluids a day, discussed to drink 60 ounces a day.  She is not weighing.  She has everything she needs for daily living.  She denies having fullness feeling in abdomen, she has no more swelling in extremities than her normal.  She is aware of up coming appts.  Will continue to visit for heart failure and medication management.   [place] [phone]

## 2020-03-14 ENCOUNTER — Other Ambulatory Visit (HOSPITAL_COMMUNITY): Payer: Self-pay

## 2020-03-14 DIAGNOSIS — E785 Hyperlipidemia, unspecified: Secondary | ICD-10-CM

## 2020-03-14 MED ORDER — ROSUVASTATIN CALCIUM 10 MG PO TABS: 10.0000 mg | ORAL_TABLET | Freq: Every day | ORAL | 3 refills | Status: DC

## 2020-03-22 ENCOUNTER — Telehealth: Payer: Self-pay

## 2020-03-22 ENCOUNTER — Ambulatory Visit (INDEPENDENT_AMBULATORY_CARE_PROVIDER_SITE_OTHER): Payer: Medicare HMO | Admitting: Pharmacist

## 2020-03-22 ENCOUNTER — Telehealth (HOSPITAL_COMMUNITY): Payer: Self-pay

## 2020-03-22 DIAGNOSIS — I480 Paroxysmal atrial fibrillation: Secondary | ICD-10-CM

## 2020-03-22 DIAGNOSIS — I5032 Chronic diastolic (congestive) heart failure: Secondary | ICD-10-CM

## 2020-03-22 DIAGNOSIS — I272 Pulmonary hypertension, unspecified: Secondary | ICD-10-CM

## 2020-03-22 DIAGNOSIS — I5022 Chronic systolic (congestive) heart failure: Secondary | ICD-10-CM | POA: Diagnosis not present

## 2020-03-22 NOTE — Telephone Encounter (Signed)
She is already on high-dose diuretics and I am hesitant to increase any further without obtaining labs.  Lets get CBC, CMP and BNP and then will decide if adjustment is needed.

## 2020-03-22 NOTE — Telephone Encounter (Signed)
Had a telephone visit with [place] today.  She states she has been taking all her medications.  She states she has gained 10 lbs since last week, in the past month she keeps telling me she has gained a couple of lbs but will not tell me the weight.  She still will not tell me today.  She just states not up to 200 yet.  She had some swelling in leg last week but no shortness of breath.  Continue to advise her to drink about 60 ounces fluid a day.  She has chronic diarrhea but refuses to go to [facility] to see a specialist.   She does not eat much, she stays in bed til afternoon then starts her day.  She takes her medication and eat either fruit or toast.  She may drink a cup of coffee or juice.  She does not eat lunch then eats supper.  May consist of a chicken wing and vegetable or homemade soup.  She is worried about the weight gain but she does have shortness of breath with exertion now.  She states her sats are good.  She has oxygen 24 hours a day.  She denies chest pain, she does states abdomen feels tight.  She states has took all her torsemide and has not skipped any.  Contacted triage line at Dr Jacklynn Ganong office and advised what she can do, if she could try an extra torsemide for a couple of days or not.  She advised will get order from Dr Fletcher Anon and call pt later this evening.  Also [place] is concerned with her fatigue she wants [name] to put a referral in to endocrinologist to check her thyroid. Sent message to Dr [name] and advised.  Caty with [place] office contacted me advised she received message and they will check her TSH level then decide, she also advised [name] Nohemy's daughter.  Contacted Naiah and advised her.  She has appt with [facility] on Monday with blood work, she states going to appt.  Will visit in person next week at her home.  Will visit for heart failure, diet and medication compliance.   [place] [phone]

## 2020-03-22 NOTE — Patient Instructions (Signed)
Visit Information  Goals Addressed              This Visit's Progress     Patient Stated     "I want to stay healthy" (pt-stated)        CARE PLAN ENTRY (see longtitudinal plan of care for additional care plan information)  Current Barriers:   Financial, social, or community barriers:  o Continues to have diarrhea. Daughter, [name], notes that her mom will avoid eating while out and about to prevent diarrhea when she isn't at home. [facility] had wanted her to see a specialist at [facility], but patient declined to so do because transportation was too difficult with her to coordinate with family (doesn't want to inconvenience her family) o HF [name] Staley notes some depressive symptoms o [name] is concerned about patient's fatigue. Reports a family history of "thyroid problems", would like her mother's TSH checked. TSH in 01/2019 was normal.  Diabetes: controlled; most recent A1c 5.2%; diet controlled.   Cardiovascular risk reduction- followed extensively by heart failure clinic/paramedicine program; Dr. [name] CHF, Dr. Caryl Comes EP, Dr. Fletcher Anon primary cardiology; [name], EMT  o Regular pill box fills from Paramedicine program.  o Current hypertensive/CHF regimen: digoxin 0.125 mg daily, Opsumit 10 mg daily, Uptravi 1600 mcg BID, Revatio 20 mg TID; patient reports that she weighs daily and her daughter notes that she weighs daily, but patient will not weigh w/ Paramedicine program, just verbally reports weights. Called [name] today to report 10 lb weight gain over a week. Patient has also told [name] that PCP advised she only needs to weigh once weekly  o Current hyperlipidemia regimen: rosuvastatin 10 mg daily; LDL at goal <70 on last check  Diarrhea: saw Effie Berkshire at [facility], was referred to [facility] because she is a high risk case for colonoscopy. Patient declines to go to [facility] for an appointment. Has a visit scheduled w/ MD at [facility] on 10/13. [name] reports that patient is taking Pepto  Bismol occasionally to help manage diarrhea, and reports that patient knows she is not supposed to take this medication  Iron deficiency anemia: Infusion on Monday per [name].   Pharmacist Clinical Goal(s):   Over the next 90 days, patient with work with PharmD and primary care provider to address optimized medication management  Interventions:  Comprehensive medication review performed, medication list updated in electronic medical record  Inter-disciplinary care team collaboration (see longitudinal plan of care)  Reviewed importance of GI appt w/ [name] and importance in ensuring patient has transportation.   Reviewed DDI between bismuth subsalicylate and Eliquis with [name].   Worry that diarrhea may be resulting in nutritional deficiencies. Diarrhea may be a side effect of Uptravi. Will message HF specialty pharmacist for advise in symptom management.  Reviewed that we are awaiting instruction from Dr. Fletcher Anon regarding management of weight gain.   Will discuss request for TSH, reports of depressive symptoms w/ PCP and determine plan for follow up with him.   Reviewed medication regimen w/ [name].  Patient Self Care Activities:   Patient will check blood glucose BID, document, and provide at future appointments  Patient will focus on medication adherence by using pill boxes filled by paramedicine  Patient will report any questions or concerns to provider   Please see past updates related to this goal by clicking on the "Past Updates" button in the selected goal         The patient verbalized understanding of instructions provided  today and declined a print copy of patient instruction materials.   Plan:  - Will collaborate w/ interdisciplinary team as above. Scheduled f/u call in ~6 weeks  [name], PharmD, [place], [place] Pharmacist [place] [phone]

## 2020-03-22 NOTE — Telephone Encounter (Addendum)
Incoming call received from [employer], EMT. [name] goes out to the patients home every other week as part of the CHF program.  [name] sts that the patient will not give her a weight as far as the number on the scale, but will tell her how many pounds she has lost or gained.  [name] sts that the patients weight has been creeping up over the last month. When [name] was out to the patients home last week she did notice that the patient had increased LE edema.  [name] sts that the patient called her today to report a 10lb weight gain in 1 week and increased sob. Patient denies increased sodium intake and adheres to a 2 liter fluid restriction.  She is taking  Torsemide 40 mg bid Potassium 20 meq daily Spironolactone 25 mg daily  Adv [name] that I will fwd the update to Dr. Fletcher Anon and f/u with the patient directly with Dr. Tyrell Antonio recommendations.

## 2020-03-22 NOTE — Chronic Care Management (AMB) (Signed)
Chronic Care Management   Follow Up Note   03/22/2020 Name: Debra Saunders MRN: [id number] DOB: 03-27-1946  Referred by: [name], MD Reason for referral : Chronic Care Management (Medication Management)   Debra Saunders is a 74 y.o. year old female who is a primary care patient of [name], [name], MD. The CCM team was consulted for assistance with chronic disease management and care coordination needs.    Contacted patient's daughter, [name], for medication management review. Also spoke with HF Paramedicine program EMT [employer].   Review of patient status, including review of consultants reports, relevant laboratory and other test results, and collaboration with appropriate care team members and the patient's provider was performed as part of comprehensive patient evaluation and provision of chronic care management services.    SDOH (Social Determinants of Health) assessments performed: No See Care Plan activities for detailed interventions related to [facility])     Outpatient Encounter Medications as of 03/22/2020  Medication Sig  . acetaminophen (TYLENOL) 325 MG tablet Take 325-650 mg by mouth every 6 (six) hours as needed for mild pain. Reported on 10/10/2015  . albuterol (PROVENTIL) (2.5 MG/3ML) 0.083% nebulizer solution Take 3 mLs (2.5 mg total) by nebulization every 6 (six) hours. (Patient taking differently: Take 2.5 mg by nebulization every 6 (six) hours as needed. )  . apixaban (ELIQUIS) 5 MG TABS tablet Take 1 tablet (5 mg total) by mouth 2 (two) times daily.  [name] dicyclomine (BENTYL) 20 MG tablet Take 1 tablet (20 mg total) by mouth every 6 (six) hours as needed (diarrhea, abdominal pain). (Patient not taking: Reported on 01/02/2020)  . digoxin (LANOXIN) 0.125 MG tablet Take 1 tablet by mouth once daily  . gabapentin (NEURONTIN) 100 MG capsule Take 2 capsules (200 mg total) by mouth 3 (three) times daily.  [name] glucose blood test strip Use with True Metrix glucometer  to check sugars BID  . Lancets MISC Use with True Metrix glucometer to check sugars BID  . macitentan (OPSUMIT) 10 MG tablet Take 1 tablet (10 mg total) by mouth daily.  . macitentan (OPSUMIT) 10 MG tablet Take 10 mg by mouth daily.  . meclizine (ANTIVERT) 25 MG tablet Take 1 tablet (25 mg total) by mouth 3 (three) times daily as needed for dizziness. (Patient not taking: Reported on 03/01/2020)  . Menthol, Topical Analgesic, (BIOFREEZE EX) Apply 1 application topically as needed (for pain).   . ondansetron (ZOFRAN) 8 MG tablet Take 1 tablet (8 mg total) by mouth every 8 (eight) hours as needed for nausea or vomiting. (Patient not taking: Reported on 02/01/2020)  . OXYGEN Inhale 4 L into the lungs continuous.  . potassium chloride SA (KLOR-CON) 20 MEQ tablet Take 1 tablet (20 mEq total) by mouth daily.  . rosuvastatin (CRESTOR) 10 MG tablet Take 1 tablet (10 mg total) by mouth daily.  . Selexipag (UPTRAVI) 1600 MCG TABS Take 1,600 mcg by mouth 2 (two) times daily.  . sildenafil (REVATIO) 20 MG tablet Take 1 tablet (20 mg total) by mouth 3 (three) times daily.  [name] spironolactone (ALDACTONE) 25 MG tablet Take 1 tablet (25 mg total) by mouth at bedtime. Please call for a office visit [phone]  . torsemide (DEMADEX) 20 MG tablet Take 2 tablets (40 mg total) by mouth 2 (two) times daily.   No facility-administered encounter medications on file as of 03/22/2020.     Objective:   Goals Addressed  This Visit's Progress     Patient Stated   .  "I want to stay healthy" (pt-stated)        CARE PLAN ENTRY (see longtitudinal plan of care for additional care plan information)  Current Barriers:  . Financial, social, or community barriers:  o Continues to have diarrhea. Daughter, [name], notes that her mom will avoid eating while out and about to prevent diarrhea when she isn't at home. [facility] had wanted her to see a specialist at [facility], but patient declined to so do because  transportation was too difficult with her to coordinate with family (doesn't want to inconvenience her family) o HF [name] St[place] notes some depressive symptoms o [name] is concerned about patient's fatigue. Reports a family history of "thyroid problems", would like her mother's TSH checked. TSH in 01/2019 was normal. . Diabetes: controlled; most recent A1c 5.2%; diet controlled.  . Cardiovascular risk reduction- followed extensively by heart failure clinic/paramedicine program; Dr. [name] CHF, Dr. Caryl Comes EP, Dr. Fletcher Anon primary cardiology; [name], EMT  o Regular pill box fills from Paramedicine program.  o Current hypertensive/CHF regimen: digoxin 0.125 mg daily, Opsumit 10 mg daily, Uptravi 1600 mcg BID, Revatio 20 mg TID; patient reports that she weighs daily and her daughter notes that she weighs daily, but patient will not weigh w/ Paramedicine program, just verbally reports weights. Called [name] today to report 10 lb weight gain over a week. Patient has also told [name] that PCP advised she only needs to weigh once weekly  o Current hyperlipidemia regimen: rosuvastatin 10 mg daily; LDL at goal <70 on last check . Diarrhea: saw Effie Berkshire at [facility], was referred to [facility] because she is a high risk case for colonoscopy. Patient declines to go to [facility] for an appointment. Has a visit scheduled w/ MD at [facility] on 10/13. [name] reports that patient is taking Pepto Bismol occasionally to help manage diarrhea, and reports that patient knows she is not supposed to take this medication . Iron deficiency anemia: Infusion on Monday per [name].   Pharmacist Clinical Goal(s):  [name] Over the next 90 days, patient with work with PharmD and primary care provider to address optimized medication management  Interventions: . Comprehensive medication review performed, medication list updated in electronic medical record . Inter-disciplinary care team collaboration (see longitudinal plan of  care) . Reviewed importance of GI appt w/ [name] and importance in ensuring patient has transportation.  . Reviewed DDI between bismuth subsalicylate and Eliquis with [name].  . Worry that diarrhea may be resulting in nutritional deficiencies. Diarrhea may be a side effect of Uptravi. Will message HF specialty pharmacist for advise in symptom management. . Reviewed that we are awaiting instruction from Dr. Fletcher Anon regarding management of weight gain.  . Will discuss request for TSH, reports of depressive symptoms w/ PCP and determine plan for follow up with him.  . Reviewed medication regimen w/ [name].  Patient Self Care Activities:  . Patient will check blood glucose BID, document, and provide at future appointments . Patient will focus on medication adherence by using pill boxes filled by paramedicine . Patient will report any questions or concerns to provider   Please see past updates related to this goal by clicking on the "Past Updates" button in the selected goal          Plan:  - Will collaborate w/ interdisciplinary team as above. Scheduled f/u call in ~6 weeks  [name], PharmD, [place], CPP Clinical  Pharmacist [place] [phone]

## 2020-03-22 NOTE — Telephone Encounter (Addendum)
Patient made aware of Dr. Tyrell Antonio response and recommendation. Patient sts that she will be coming to the [facility] on 03/26/20 to get labs drawn. She rely's on [facility] transportation. Advised the patient that I will forward the message to [name] to see if he could add the Cmet and BNP  (Cbc is already ordered) that Dr. Fletcher Anon is wanting to her lab orders.

## 2020-03-22 NOTE — Telephone Encounter (Signed)
Lattie Haw- we can order the labs.  H/T- please order the requested labs.  Thanks GB

## 2020-03-23 NOTE — Telephone Encounter (Signed)
Orders added

## 2020-03-23 NOTE — Telephone Encounter (Signed)
Noted TY

## 2020-03-26 ENCOUNTER — Other Ambulatory Visit: Payer: Self-pay

## 2020-03-26 ENCOUNTER — Inpatient Hospital Stay (HOSPITAL_BASED_OUTPATIENT_CLINIC_OR_DEPARTMENT_OTHER): Payer: Medicare HMO | Admitting: Internal Medicine

## 2020-03-26 ENCOUNTER — Inpatient Hospital Stay: Payer: Medicare HMO | Attending: Internal Medicine

## 2020-03-26 ENCOUNTER — Inpatient Hospital Stay: Payer: Medicare HMO

## 2020-03-26 ENCOUNTER — Encounter: Payer: Self-pay | Admitting: Internal Medicine

## 2020-03-26 DIAGNOSIS — R001 Bradycardia, unspecified: Secondary | ICD-10-CM | POA: Insufficient documentation

## 2020-03-26 DIAGNOSIS — I428 Other cardiomyopathies: Secondary | ICD-10-CM | POA: Insufficient documentation

## 2020-03-26 DIAGNOSIS — E611 Iron deficiency: Secondary | ICD-10-CM | POA: Diagnosis not present

## 2020-03-26 DIAGNOSIS — D509 Iron deficiency anemia, unspecified: Secondary | ICD-10-CM | POA: Diagnosis not present

## 2020-03-26 DIAGNOSIS — Z87891 Personal history of nicotine dependence: Secondary | ICD-10-CM | POA: Diagnosis not present

## 2020-03-26 DIAGNOSIS — E538 Deficiency of other specified B group vitamins: Secondary | ICD-10-CM | POA: Insufficient documentation

## 2020-03-26 DIAGNOSIS — I480 Paroxysmal atrial fibrillation: Secondary | ICD-10-CM

## 2020-03-26 DIAGNOSIS — I130 Hypertensive heart and chronic kidney disease with heart failure and stage 1 through stage 4 chronic kidney disease, or unspecified chronic kidney disease: Secondary | ICD-10-CM | POA: Insufficient documentation

## 2020-03-26 DIAGNOSIS — I5032 Chronic diastolic (congestive) heart failure: Secondary | ICD-10-CM

## 2020-03-26 DIAGNOSIS — N83201 Unspecified ovarian cyst, right side: Secondary | ICD-10-CM | POA: Insufficient documentation

## 2020-03-26 DIAGNOSIS — N83202 Unspecified ovarian cyst, left side: Secondary | ICD-10-CM | POA: Diagnosis not present

## 2020-03-26 DIAGNOSIS — Z8673 Personal history of transient ischemic attack (TIA), and cerebral infarction without residual deficits: Secondary | ICD-10-CM | POA: Insufficient documentation

## 2020-03-26 DIAGNOSIS — I2721 Secondary pulmonary arterial hypertension: Secondary | ICD-10-CM | POA: Insufficient documentation

## 2020-03-26 DIAGNOSIS — E785 Hyperlipidemia, unspecified: Secondary | ICD-10-CM | POA: Diagnosis not present

## 2020-03-26 DIAGNOSIS — I509 Heart failure, unspecified: Secondary | ICD-10-CM | POA: Diagnosis not present

## 2020-03-26 DIAGNOSIS — Z7901 Long term (current) use of anticoagulants: Secondary | ICD-10-CM | POA: Insufficient documentation

## 2020-03-26 DIAGNOSIS — Z79899 Other long term (current) drug therapy: Secondary | ICD-10-CM | POA: Insufficient documentation

## 2020-03-26 DIAGNOSIS — E1140 Type 2 diabetes mellitus with diabetic neuropathy, unspecified: Secondary | ICD-10-CM | POA: Diagnosis not present

## 2020-03-26 DIAGNOSIS — E1122 Type 2 diabetes mellitus with diabetic chronic kidney disease: Secondary | ICD-10-CM | POA: Insufficient documentation

## 2020-03-26 DIAGNOSIS — I4819 Other persistent atrial fibrillation: Secondary | ICD-10-CM | POA: Diagnosis not present

## 2020-03-26 DIAGNOSIS — N183 Chronic kidney disease, stage 3 unspecified: Secondary | ICD-10-CM | POA: Diagnosis not present

## 2020-03-26 DIAGNOSIS — J449 Chronic obstructive pulmonary disease, unspecified: Secondary | ICD-10-CM | POA: Insufficient documentation

## 2020-03-26 LAB — FERRITIN: Ferritin: 183 ng/mL (ref 11–307)

## 2020-03-26 LAB — IRON AND TIBC
Iron: 60 ug/dL (ref 28–170)
Saturation Ratios: 17 % (ref 10.4–31.8)
TIBC: 360 ug/dL (ref 250–450)
UIBC: 300 ug/dL

## 2020-03-26 LAB — CBC WITH DIFFERENTIAL/PLATELET
Abs Immature Granulocytes: 0.01 10*3/uL (ref 0.00–0.07)
Basophils Absolute: 0 10*3/uL (ref 0.0–0.1)
Basophils Relative: 1 %
Eosinophils Absolute: 0.2 10*3/uL (ref 0.0–0.5)
Eosinophils Relative: 4 %
HCT: 30 % — ABNORMAL LOW (ref 36.0–46.0)
Hemoglobin: 9.3 g/dL — ABNORMAL LOW (ref 12.0–15.0)
Immature Granulocytes: 0 %
Lymphocytes Relative: 29 %
Lymphs Abs: 1.4 10*3/uL (ref 0.7–4.0)
MCH: 27.5 pg (ref 26.0–34.0)
MCHC: 31 g/dL (ref 30.0–36.0)
MCV: 88.8 fL (ref 80.0–100.0)
Monocytes Absolute: 0.3 10*3/uL (ref 0.1–1.0)
Monocytes Relative: 7 %
Neutro Abs: 3 10*3/uL (ref 1.7–7.7)
Neutrophils Relative %: 59 %
Platelets: 176 10*3/uL (ref 150–400)
RBC: 3.38 MIL/uL — ABNORMAL LOW (ref 3.87–5.11)
RDW: 12.5 % (ref 11.5–15.5)
WBC: 4.9 10*3/uL (ref 4.0–10.5)
nRBC: 0 % (ref 0.0–0.2)

## 2020-03-26 LAB — COMPREHENSIVE METABOLIC PANEL
ALT: 19 U/L (ref 0–44)
AST: 16 U/L (ref 15–41)
Albumin: 3.6 g/dL (ref 3.5–5.0)
Alkaline Phosphatase: 137 U/L — ABNORMAL HIGH (ref 38–126)
Anion gap: 7 (ref 5–15)
BUN: 26 mg/dL — ABNORMAL HIGH (ref 8–23)
CO2: 27 mmol/L (ref 22–32)
Calcium: 8.4 mg/dL — ABNORMAL LOW (ref 8.9–10.3)
Chloride: 104 mmol/L (ref 98–111)
Creatinine, Ser: 0.89 mg/dL (ref 0.44–1.00)
GFR calc Af Amer: >60 mL/min (ref >60)
GFR calc non Af Amer: >60 mL/min (ref >60)
Glucose, Bld: 113 mg/dL — ABNORMAL HIGH (ref 70–99)
Potassium: 3.7 mmol/L (ref 3.5–5.1)
Sodium: 138 mmol/L (ref 135–145)
Total Bilirubin: 0.6 mg/dL (ref 0.3–1.2)
Total Protein: 7.3 g/dL (ref 6.5–8.1)

## 2020-03-26 LAB — BRAIN NATRIURETIC PEPTIDE: B Natriuretic Peptide: 56.7 pg/mL (ref 0.0–100.0)

## 2020-03-26 MED ORDER — SODIUM CHLORIDE 0.9 % IV SOLN
Freq: Once | INTRAVENOUS | Status: AC
  Filled 2020-03-26: qty 250

## 2020-03-26 MED ORDER — CYANOCOBALAMIN 1000 MCG/ML IJ SOLN
1000.0000 ug | Freq: Once | INTRAMUSCULAR | Status: AC
  Administered 2020-03-26: 1000 ug via INTRAMUSCULAR
  Filled 2020-03-26: qty 1

## 2020-03-26 MED ORDER — IRON SUCROSE 20 MG/ML IV SOLN
200.0000 mg | Freq: Once | INTRAVENOUS | Status: AC
  Administered 2020-03-26: 200 mg via INTRAVENOUS
  Filled 2020-03-26: qty 10

## 2020-03-26 NOTE — Progress Notes (Signed)
[place] NOTE  Patient Care Team: [name], MD as PCP - General (Family Medicine) Wellington Hampshire, MD as PCP - Cardiology (Cardiology) Deboraha Sprang, MD as PCP - Electrophysiology (Cardiology) Thom Chimes, MD as Referring Physician (Allergy) Juanito Doom, MD as Referring Physician (Internal Medicine) De Hollingshead, RPH-CPP as Pharmacist (Pharmacist) Cammie Sickle, MD as Consulting Physician (Hematology and Oncology) Jon Billings, RN as [place] Management Anemia CHIEF COMPLAINTS/PURPOSE OF CONSULTATION: Anemia   HEMATOLOGY HISTORY  # CHORNIC IRON DEFICIENCY ANEMIA [BL 10-11]-Jan today 21 PCP Hb-8.8; Antonieta Pert sat-5%; ferritin 11] EGD-; colonoscopy MARCH 2020 -polyp [[facility]-hold further work-up secondary to cardiac issues]; remote-EGD-?;  February 2021-LDH/haptoglobin normal; kappa lambda light chain ratio/M panel normal;  on Venofer; November 2020-CT scan question early cirrhosis/no splenomegaly.  No acute process; MARCH 2021- STOOL OCCULT x3- NEGATIVE.   #B12 deficiency-IM every 3 to 4 months  #2014 -question blood transfusion  [patient admitted for stroke]; CHF chronic -[ 2004 on 4 lits; Dr.Arida/Klein]; CT scan chest 2021 -no adenopathy; fibrotic changes; walker; CKD stage III-   #Incidental right axillary mass-s/p US biopsy [PCP] -negative for malignancy.  HISTORY OF PRESENTING ILLNESS:  Debra Saunders 74 y.o.  female anemia/likely secondary to CKD stage III is here for follow-up.  Patient complains of multiple loose stools a day for the last few months.  States that she is having a loose stool after each meal/sip of water.  Chronic shortness of breath with exertion.  Chronic swelling in the legs.  Continues to be on oxygen.  Denies any blood in stools or black-colored stools.  Positive fatigue.  Review of Systems  Constitutional: Positive for malaise/fatigue. Negative for chills, diaphoresis, fever  and weight loss.  HENT: Negative for nosebleeds and sore throat.   Eyes: Negative for double vision.  Respiratory: Positive for shortness of breath. Negative for cough, hemoptysis, sputum production and wheezing.   Cardiovascular: Negative for chest pain, palpitations, orthopnea and leg swelling.  Gastrointestinal: Positive for diarrhea. Negative for abdominal pain, blood in stool, constipation, heartburn, melena, nausea and vomiting.  Genitourinary: Negative for dysuria, frequency and urgency.  Musculoskeletal: Negative for back pain and joint pain.  Skin: Negative.  Negative for itching and rash.  Neurological: Negative for dizziness, tingling, focal weakness, weakness and headaches.  Endo/Heme/Allergies: Does not bruise/bleed easily.  Psychiatric/Behavioral: Negative for depression. The patient is not nervous/anxious and does not have insomnia.     MEDICAL HISTORY:  Past Medical History:  Diagnosis Date  . Arthritis   . Atrial fibrillation, persistent ([place])    a. s/p TEE-guided DCCV on 08/20/2017  . Cardiorenal syndrome    a. 09/2017 Creat rose to 2.7 w/ diuresis-->HD x 2.  . Chest pain    a. 01/2012 Cath: nonobs dzs;  c. 04/2014 MV: No ischemia.  . Chronic back pain   . Chronic combined systolic (congestive) and diastolic (congestive) heart failure ([place])    a. Dx 2005 @ [facility];  b. 02/2014 Echo: EF 55-60%; c. 07/2017: EF 30-35%, diffuse HK, trivial AI, severe MR, moderate TR; d. 09/2017 Echo: EF 40-45%; e. 02/2018 Echo: EF 55-60%, Gr1 DD; f. 08/2019 Echo: EF 60-65%, Gr2 DD, Nl RV fxn, RVSP 49.60mmHg. Mod-Sev MR. Mild to mod TR. Mod PR. RA ~ 94mmHg.  [name] Chronic respiratory failure (HCC)    a. on home O2  . COPD (chronic obstructive pulmonary disease) ([place])    a. Home O2 - 3lpm  . Gastritis   .  History of intracranial hemorrhage    a. 6/14 described by neurosurgery as "small frontal posttraumatic SAH " - pt was on xarelto @ the time.  . Hyperlipidemia   . Hypertension   . Interstitial lung  disease ([place])   . Lipoma of back   . Lymphedema    a. Chronic LLE edema.  [name] NICM (nonischemic cardiomyopathy) ([place])    a. 09/2017 Echo: EF 40-45%; b. 09/2017 Cath: minor, insignificant CAD; c. 02/2018 Echo: EF 55-60%, Gr1 DD; d. 08/2019 Echo: EF 60-65%, Gr2 DD.  [name] Obesity   . PAH (pulmonary artery hypertension) ([place])    a. 09/2017 Echo: PASP 69mmHg; b. 09/2017 RHC: RA 23, RV 84/13, PA 84/29, PCWP 20, CO 3.89, CI 1.89. LVEDP 18.  . Sepsis with metabolic encephalopathy ([place])   . Sleep apnea   . Streptococcal bacteremia   . Stroke ([place])   . Subarachnoid hemorrhage ([place]) 01/19/2013  . Symptomatic bradycardia    a. s/p BSX PPM.  . Thyroid nodule   . Type II diabetes mellitus ([place])   . Urinary incontinence     SURGICAL HISTORY: Past Surgical History:  Procedure Laterality Date  . ABDOMINAL HYSTERECTOMY  1969  . BREAST BIOPSY Right 10/28/2019   lymph node bx, [place] Bx, pending path   . CARDIAC CATHETERIZATION  2013   @ [facility]: No obstructive CAD: Only 20% ostial left circumflex.  [name] CARDIOVERSION N/A 10/12/2017   Procedure: CARDIOVERSION;  Surgeon: Minna Merritts, MD;  Location: [facility] ORS;  Service: Cardiovascular;  Laterality: N/A;  . CHOLECYSTECTOMY    . COLONOSCOPY WITH PROPOFOL N/A 09/10/2018   Procedure: COLONOSCOPY WITH PROPOFOL;  Surgeon: Manya Silvas, MD;  Location: [facility] ENDOSCOPY;  Service: Endoscopy;  Laterality: N/A;  . CYST REMOVAL TRUNK  2002   BACK  . DIALYSIS/PERMA CATHETER INSERTION N/A 10/06/2017   Procedure: DIALYSIS/PERMA CATHETER INSERTION;  Surgeon: Katha Cabal, MD;  Location: [facility] CV LAB;  Service: Cardiovascular;  Laterality: N/A;  . INSERT / REPLACE / REMOVE PACEMAKER    . lipoma removal  2014   back  . PACEMAKER INSERTION  [address] Scientific dual chamber pacemaker implanted by Dr Caryl Comes for symptomatic bradycardia  . PERMANENT PACEMAKER INSERTION N/A 01/09/2014   Procedure: PERMANENT PACEMAKER INSERTION;  Surgeon: Deboraha Sprang, MD;  Location:  [facility] CATH LAB;  Service: Cardiovascular;  Laterality: N/A;  . RIGHT HEART CATH N/A 04/19/2018   Procedure: RIGHT HEART CATH;  Surgeon: Larey Dresser, MD;  Location: [place] CV LAB;  Service: Cardiovascular;  Laterality: N/A;  . RIGHT/LEFT HEART CATH AND CORONARY ANGIOGRAPHY N/A 10/19/2017   Procedure: RIGHT/LEFT HEART CATH AND CORONARY ANGIOGRAPHY;  Surgeon: Wellington Hampshire, MD;  Location: [place] CV LAB;  Service: Cardiovascular;  Laterality: N/A;  . TEE WITHOUT CARDIOVERSION N/A 08/20/2017   Procedure: TRANSESOPHAGEAL ECHOCARDIOGRAM (TEE) & Direct current cardioversion;  Surgeon: Minna Merritts, MD;  Location: [facility] ORS;  Service: Cardiovascular;  Laterality: N/A;  . TRIGGER FINGER RELEASE      SOCIAL HISTORY: Social History   Socioeconomic History  . Marital status: Divorced    Spouse name: Not on file  . Number of children: 4  . Years of education: 42  . Highest education level: High school graduate  Occupational History  . Occupation: Retired- factory work    Comment: exposed to Pharmacist, community  Tobacco Use  . Smoking status: Former Smoker    Packs/day: 0.30    Years: 2.00    Pack  years: 0.60    Types: Cigarettes    Quit date: 06/23/1990    Years since quitting: 29.7  . Smokeless tobacco: Never Used  Vaping Use  . Vaping Use: Never used  Substance and Sexual Activity  . Alcohol use: No  . Drug use: No  . Sexual activity: Not Currently  Other Topics Concern  . Not on file  Social History Narrative   Lives in [place] with daughter. Has 4 children. No pets.      Work - Restaurant manager, fast food, retired      Diet - regular      02/14/2019: reports has Meals On Wheels delivered to her home;       Remote hx of smoking > 30 years; No alcohol-    Social Determinants of Health   Financial Resource Strain: Low Risk   . Difficulty of Paying Living Expenses: Not very hard  Food Insecurity: No Food Insecurity  . Worried About Charity fundraiser in the Last Year: Never  true  . Ran Out of Food in the Last Year: Never true  Transportation Needs: Unmet Transportation Needs  . Lack of Transportation (Medical): Yes  . Lack of Transportation (Non-Medical): No  Physical Activity:   . Days of Exercise per Week: Not on file  . Minutes of Exercise per Session: Not on file  Stress:   . Feeling of Stress : Not on file  Social Connections:   . Frequency of Communication with Friends and Family: Not on file  . Frequency of Social Gatherings with Friends and Family: Not on file  . Attends Religious Services: Not on file  . Active Member of Clubs or Organizations: Not on file  . Attends Archivist Meetings: Not on file  . Marital Status: Not on file  Intimate Partner Violence:   . Fear of Current or Ex-Partner: Not on file  . Emotionally Abused: Not on file  . Physically Abused: Not on file  . Sexually Abused: Not on file    FAMILY HISTORY: Family History  Problem Relation Age of Onset  . Heart disease Mother   . Hypertension Mother   . COPD Mother        was a smoker  . Thyroid disease Mother   . Hypertension Father   . Hypertension Sister   . Thyroid disease Sister   . Breast cancer Sister   . Hypertension Sister   . Thyroid disease Sister   . Bone cancer Sister   . Breast cancer Maternal Aunt   . Kidney disease Neg Hx   . Bladder Cancer Neg Hx     ALLERGIES:  is allergic to aspirin, other, and penicillins.  MEDICATIONS:  Current Outpatient Medications  Medication Sig Dispense Refill  . acetaminophen (TYLENOL) 325 MG tablet Take 325-650 mg by mouth every 6 (six) hours as needed for mild pain. Reported on 10/10/2015    . albuterol (PROVENTIL) (2.5 MG/3ML) 0.083% nebulizer solution Take 3 mLs (2.5 mg total) by nebulization every 6 (six) hours. (Patient taking differently: Take 2.5 mg by nebulization every 6 (six) hours as needed. ) 75 mL 12  . apixaban (ELIQUIS) 5 MG TABS tablet Take 1 tablet (5 mg total) by mouth 2 (two) times daily. 60  tablet 2  . digoxin (LANOXIN) 0.125 MG tablet Take 1 tablet by mouth once daily 90 tablet 3  . gabapentin (NEURONTIN) 100 MG capsule Take 2 capsules (200 mg total) by mouth 3 (three) times daily. 180 capsule 0  .  glucose blood test strip Use with True Metrix glucometer to check sugars BID 200 each 3  . Lancets MISC Use with True Metrix glucometer to check sugars BID 200 each 3  . macitentan (OPSUMIT) 10 MG tablet Take 1 tablet (10 mg total) by mouth daily. 30 tablet 5  . Menthol, Topical Analgesic, (BIOFREEZE EX) Apply 1 application topically as needed (for pain).     . OXYGEN Inhale 4 L into the lungs continuous.    . potassium chloride SA (KLOR-CON) 20 MEQ tablet Take 1 tablet (20 mEq total) by mouth daily. 90 tablet 0  . rosuvastatin (CRESTOR) 10 MG tablet Take 1 tablet (10 mg total) by mouth daily. 90 tablet 3  . Selexipag (UPTRAVI) 1600 MCG TABS Take 1,600 mcg by mouth 2 (two) times daily.    . sildenafil (REVATIO) 20 MG tablet Take 1 tablet (20 mg total) by mouth 3 (three) times daily. 270 tablet 3  . spironolactone (ALDACTONE) 25 MG tablet Take 1 tablet (25 mg total) by mouth at bedtime. Please call for a office visit [phone] 90 tablet 0  . torsemide (DEMADEX) 20 MG tablet Take 2 tablets (40 mg total) by mouth 2 (two) times daily.    . meclizine (ANTIVERT) 25 MG tablet Take 1 tablet (25 mg total) by mouth 3 (three) times daily as needed for dizziness. (Patient not taking: Reported on 03/22/2020) 30 tablet 0  . ondansetron (ZOFRAN) 8 MG tablet Take 1 tablet (8 mg total) by mouth every 8 (eight) hours as needed for nausea or vomiting. (Patient not taking: Reported on 02/01/2020) 30 tablet 2   No current facility-administered medications for this visit.      PHYSICAL EXAMINATION:   Vitals:   03/26/20 1326  BP: 122/61  Pulse: (!) 42  Resp: 18  Temp: 98.6 F (37 C)  SpO2: 100%   Filed Weights   03/26/20 1326  Weight: 186 lb (84.4 kg)    Physical Exam Constitutional:       Comments: Patient in wheelchair.  Alone.  She is on O2 nasal cannula.  HENT:     Head: Normocephalic and atraumatic.     Mouth/Throat:     Pharynx: No oropharyngeal exudate.  Eyes:     Pupils: Pupils are equal, round, and reactive to light.  Cardiovascular:     Rate and Rhythm: Normal rate and regular rhythm.  Pulmonary:     Effort: No respiratory distress.     Breath sounds: No wheezing.  Abdominal:     General: Bowel sounds are normal. There is no distension.     Palpations: Abdomen is soft. There is no mass.     Tenderness: There is no abdominal tenderness. There is no guarding or rebound.  Musculoskeletal:        General: Swelling present. No tenderness. Normal range of motion.     Cervical back: Normal range of motion and neck supple.  Skin:    General: Skin is warm.  Neurological:     Mental Status: She is alert and oriented to person, place, and time.  Psychiatric:        Mood and Affect: Affect normal.     LABORATORY DATA:  I have reviewed the data as listed Lab Results  Component Value Date   WBC 4.9 03/26/2020   HGB 9.3 (L) 03/26/2020   HCT 30.0 (L) 03/26/2020   MCV 88.8 03/26/2020   PLT 176 03/26/2020   Recent Labs    07/08/19 1504 07/08/19 1504 08/01/19  1524 08/01/19 1524 11/29/19 1244 01/30/20 1127 03/26/20 1317  NA 139   < > 137   < > 141 139 138  K 4.4   < > 3.8   < > 4.1 3.8 3.7  CL 101   < > 99   < > 104 103 104  CO2 28   < > 28   < > 30 26 27   GLUCOSE 102*   < > 94   < > 106* 97 113*  BUN 26*   < > 24*   < > 28* 26* 26*  CREATININE 1.39*  --  1.16*   < > 1.19* 1.13* 0.89  CALCIUM 9.4   < > 9.3   < > 8.8* 8.6* 8.4*  GFRNONAA  --   --  46*   < > 44* 47* >60  GFRAA  --   --  53*   < > 51* 55* >60  PROT 7.4  --  8.1  --   --   --  7.3  ALBUMIN  --   --  4.0  --   --   --  3.6  AST 11  --  15  --   --   --  16  ALT 5*  --  10  --   --   --  19  ALKPHOS  --   --  124  --   --   --  137*  BILITOT 0.4  --  0.5  --   --   --  0.6   < > = values  in this interval not displayed.     CUP PACEART REMOTE DEVICE CHECK  Result Date: 02/29/2020 Scheduled remote reviewed. Normal device function.  8-ATR episodes longest 54 secs. Burden <1%. 3-NSVT @ 167-175 bpm x 10 secs/5 intervals. Next remote 91 days. [place] [facility]   Iron deficiency # Chronic Anemia [BL10-11]-iron deficiency/? CKD stage III [today GFR >60] currently s/p Venofe.hemoglobin today is 9.2 improved from around 8.  Proceed with Venofer today.   # Etiology of anemia- ? Chronic diarrhea/malabsopbtion; no obvious iron deficiency; await iron studies today; recommend GI evaluation at [facility].   #CKD stage III-GFR 44 stable.  #History of DVT/s/p IVC filter; ?  A. Fib-Eliquis-STABLE>   #Borderline low B12-230; s/p B12 injection. B12 injection today.   # DISPOSITION:   # venofer/b12 today #  Follow up in 2 months-MD-labs- cbc;bmp;iron studies/ferritin; possible Venofer OR RETACRIT; B12 injection- Dr.B  Cc; Tammi Klippel  All questions were answered. The patient knows to call the clinic with any problems, questions or concerns.    Cammie Sickle, MD 03/26/2020 2:06 PM

## 2020-03-26 NOTE — Addendum Note (Signed)
Addended by: Delice Bison E on: 03/26/2020 02:47 PM   Modules accepted: Orders

## 2020-03-26 NOTE — Assessment & Plan Note (Addendum)
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 CKD stage III [today GFR >60] currently s/p Venofe.hemoglobin today is 9.2 improved from around 8.  Proceed with Venofer today.   # Etiology of anemia- ? Chronic diarrhea/malabsopbtion; no obvious iron deficiency; await iron studies today; recommend GI evaluation at [facility].   #CKD stage III-GFR 44 stable.  #History of DVT/s/p IVC filter; ?  A. Fib-Eliquis-STABLE>   #Borderline low B12-230; s/p B12 injection. B12 injection today.   # DISPOSITION:   # venofer/b12 today #  Follow up in 2 months-MD-labs- cbc;bmp;iron studies/ferritin; possible Venofer OR RETACRIT; B12 injection- Dr.B  Cc; Tammi Klippel

## 2020-03-26 NOTE — Progress Notes (Signed)
Has had chronic diarrhea x6 months. Can not eat without it going right through her. Has been losing weight due to not being able to eat due to diarrhea. Would like TSH checked per Dr. [name]

## 2020-03-27 NOTE — Telephone Encounter (Signed)
Cmet, Cbc, Bnp drawn 03/26/20 results are available in Epic. Msg fwd to Dr. Fletcher Anon to review

## 2020-03-28 ENCOUNTER — Other Ambulatory Visit (HOSPITAL_COMMUNITY): Payer: Self-pay

## 2020-03-28 NOTE — Progress Notes (Signed)
Today had a home visit with Debra Saunders.  She states very tired today.  She is taking a nap when I leave.  She stayed in bed til 11:45 this morning, she states dont go to sleep til around 4 am.  She states still having diarrhea, she sees a Environmental education officer in [place] next week.  She states she does not eat much and been watching what she eats but when you talk to her I think she may be eating other things, such as some sweets.  She states had 2 pieces of cake yesterday brought to her by daughter, she states she is making a cheesecake later.  She has candy in refrigerator, her grandson was getting a piece and she says he eats it but he states she eats it.  She states ate cheese toast for breakfast.  She had chicken livers and onion she cooked on Sunday.  She loves to cook when feels like it.  She refused to tell me her weight, just that not changed since last week at her doctors which was 186 lbs. She has no more swelling than last week, she states breathing is better this week.  She denies chest pain, headaches or dizziness.  She has all her medications and refilled her med boxes.  Will continue to visit for heart failure.   [name] [place] [phone]

## 2020-03-28 NOTE — Telephone Encounter (Addendum)
Spoke with the patient. Advised her that Dr. Fletcher Anon has reviewed her recent lab results and has given the following recommendation:  Labs looked reasonable and BNP was completely normal.  Based on this, I do not think we should increase her diuretics.  She can elevate her legs and use knee-high support stockings if needed.  Follow low-sodium diet and continue to monitor weight and symptoms.   Patient verbalized understanding and voiced appreciation for the call.

## 2020-03-28 NOTE — Telephone Encounter (Signed)
Labs looked reasonable and BNP was completely normal.  Based on this, I do not think we should increase her diuretics.  She can elevate her legs and use knee-high support stockings if needed.  Follow low-sodium diet and continue to monitor weight and symptoms.

## 2020-03-29 ENCOUNTER — Ambulatory Visit (INDEPENDENT_AMBULATORY_CARE_PROVIDER_SITE_OTHER): Payer: Medicare HMO | Admitting: Pharmacist

## 2020-03-29 DIAGNOSIS — I272 Pulmonary hypertension, unspecified: Secondary | ICD-10-CM | POA: Diagnosis not present

## 2020-03-29 DIAGNOSIS — I5022 Chronic systolic (congestive) heart failure: Secondary | ICD-10-CM

## 2020-03-29 NOTE — Patient Instructions (Signed)
Visit Information  Goals Addressed              This Visit's Progress     Patient Stated   .  "I want to stay healthy" (pt-stated)        CARE PLAN ENTRY (see longtitudinal plan of care for additional care plan information)  Current Barriers:  . Financial, social, or community barriers:  o Continued to be bothered by diarrhea. She does not think the [name] is contributing, as she notes she had diarrhea before.  . Diabetes: controlled; most recent A1c 5.2%; diet controlled.  . Cardiovascular risk reduction- followed extensively by heart failure clinic/paramedicine program; Dr. [name] CHF, Dr. Caryl Comes EP, Dr. Fletcher Anon primary cardiology; [name], EMT  o Regular pill box fills from Paramedicine program.  o Current pulmonary hypertensive/CHF regimen: digoxin 0.125 mg daily, Opsumit 10 mg daily, Uptravi 1600 mcg BID, Revatio 20 mg TID; patient reports that she weighs daily and her daughter notes that she weighs daily, but patient will not weigh w/ Paramedicine program, just verbally reports weights.  o Current hyperlipidemia regimen: rosuvastatin 10 mg daily; LDL at goal <70 on last check . Diarrhea: saw Effie Berkshire at [facility], was referred to [facility] because she is a high risk case for colonoscopy. Visit scheduled w/ MD at [facility] on 10/13.  . Iron deficiency anemia: Infusions per [name]  Pharmacist Clinical Goal(s):  [name] Over the next 90 days, patient with work with PharmD and primary care provider to address optimized medication management  Interventions: . Discussed diarrhea w/ Hf/pulmonary HTN pharmacist Audry Riles. [name] may be exacerbating diarrhea. She suggested OTC loperamide w/ max OTC labeled daily dose of 16 mg daily. Passed this recommendation along to patient and her daughter, [name]. [name] will pick up some loperamide for patient today.  Patient Self Care Activities:  . Patient will check blood glucose BID, document, and provide at future appointments . Patient  will focus on medication adherence by using pill boxes filled by paramedicine . Patient will report any questions or concerns to provider   Please see past updates related to this goal by clicking on the "Past Updates" button in the selected goal         The patient verbalized understanding of instructions provided today and declined a print copy of patient instruction materials.   Plan:  - Will outreach as previously scheduled  [name], PharmD, [place], [place] Pharmacist [place] [phone]

## 2020-03-29 NOTE — Chronic Care Management (AMB) (Signed)
Chronic Care Management   Follow Up Note   03/29/2020 Name: Debra Saunders MRN: [id number] DOB: [birth year]  Referred by: [name], MD Reason for referral : Chronic Care Management (Medication Management)   Debra Saunders is a 74 y.o. year old female who is a primary care patient of [name], [name], MD. The CCM team was consulted for assistance with chronic disease management and care coordination needs.    Contacted patient for medication management follow up.  Review of patient status, including review of consultants reports, relevant laboratory and other test results, and collaboration with appropriate care team members and the patient's provider was performed as part of comprehensive patient evaluation and provision of chronic care management services.    SDOH (Social Determinants of Health) assessments performed: No See Care Plan activities for detailed interventions related to [facility])     Outpatient Encounter Medications as of 03/29/2020  Medication Sig   acetaminophen (TYLENOL) 325 MG tablet Take 325-650 mg by mouth every 6 (six) hours as needed for mild pain. Reported on 10/10/2015   albuterol (PROVENTIL) (2.5 MG/3ML) 0.083% nebulizer solution Take 3 mLs (2.5 mg total) by nebulization every 6 (six) hours. (Patient taking differently: Take 2.5 mg by nebulization every 6 (six) hours as needed. )   apixaban (ELIQUIS) 5 MG TABS tablet Take 1 tablet (5 mg total) by mouth 2 (two) times daily.   digoxin (LANOXIN) 0.125 MG tablet Take 1 tablet by mouth once daily   gabapentin (NEURONTIN) 100 MG capsule Take 2 capsules (200 mg total) by mouth 3 (three) times daily.   glucose blood test strip Use with True Metrix glucometer to check sugars BID   Lancets MISC Use with True Metrix glucometer to check sugars BID   macitentan (OPSUMIT) 10 MG tablet Take 1 tablet (10 mg total) by mouth daily.   meclizine (ANTIVERT) 25 MG tablet Take 1 tablet (25 mg total) by mouth 3  (three) times daily as needed for dizziness.   Menthol, Topical Analgesic, (BIOFREEZE EX) Apply 1 application topically as needed (for pain).    ondansetron (ZOFRAN) 8 MG tablet Take 1 tablet (8 mg total) by mouth every 8 (eight) hours as needed for nausea or vomiting. (Patient not taking: Reported on 02/01/2020)   OXYGEN Inhale 4 L into the lungs continuous.   potassium chloride SA (KLOR-CON) 20 MEQ tablet Take 1 tablet (20 mEq total) by mouth daily.   rosuvastatin (CRESTOR) 10 MG tablet Take 1 tablet (10 mg total) by mouth daily.   Selexipag (UPTRAVI) 1600 MCG TABS Take 1,600 mcg by mouth 2 (two) times daily.   sildenafil (REVATIO) 20 MG tablet Take 1 tablet (20 mg total) by mouth 3 (three) times daily.   spironolactone (ALDACTONE) 25 MG tablet Take 1 tablet (25 mg total) by mouth at bedtime. Please call for a office visit [phone]   torsemide (DEMADEX) 20 MG tablet Take 2 tablets (40 mg total) by mouth 2 (two) times daily.   No facility-administered encounter medications on file as of 03/29/2020.     Objective:   Goals Addressed              This Visit's Progress     Patient Stated     "I want to stay healthy" (pt-stated)        CARE PLAN ENTRY (see longtitudinal plan of care for additional care plan information)  Current Barriers:   Financial, social, or community barriers:  o Continued to be bothered by diarrhea. She  does not think the [name] is contributing, as she notes she had diarrhea before.   Diabetes: controlled; most recent A1c 5.2%; diet controlled.   Cardiovascular risk reduction- followed extensively by heart failure clinic/paramedicine program; Dr. [name] CHF, Dr. Caryl Comes EP, Dr. Fletcher Anon primary cardiology; [name], EMT  o Regular pill box fills from Paramedicine program.  o Current pulmonary hypertensive/CHF regimen: digoxin 0.125 mg daily, Opsumit 10 mg daily, Uptravi 1600 mcg BID, Revatio 20 mg TID; patient reports that she weighs daily and her  daughter notes that she weighs daily, but patient will not weigh w/ Paramedicine program, just verbally reports weights.  o Current hyperlipidemia regimen: rosuvastatin 10 mg daily; LDL at goal <70 on last check  Diarrhea: saw Effie Berkshire at [facility], was referred to [facility] because she is a high risk case for colonoscopy. Visit scheduled w/ MD at [facility] on 10/13.   Iron deficiency anemia: Infusions per [name]  Pharmacist Clinical Goal(s):   Over the next 90 days, patient with work with PharmD and primary care provider to address optimized medication management  Interventions:  Discussed diarrhea w/ Hf/pulmonary HTN pharmacist Audry Riles. [name] may be exacerbating diarrhea. She suggested OTC loperamide w/ max OTC labeled daily dose of 16 mg daily. Passed this recommendation along to patient and her daughter, [name]. [name] will pick up some loperamide for patient today.  Patient Self Care Activities:   Patient will check blood glucose BID, document, and provide at future appointments  Patient will focus on medication adherence by using pill boxes filled by paramedicine  Patient will report any questions or concerns to provider   Please see past updates related to this goal by clicking on the "Past Updates" button in the selected goal          Plan:  - Will outreach as previously scheduled  Debra Saunders, PharmD, [place], [place] Pharmacist [place] [phone]

## 2020-04-03 ENCOUNTER — Other Ambulatory Visit: Payer: Self-pay

## 2020-04-03 ENCOUNTER — Ambulatory Visit
Admission: RE | Admit: 2020-04-03 | Discharge: 2020-04-03 | Disposition: A | Discharge status: Home / Self Care | Payer: Medicare HMO | Source: Ambulatory Visit | Attending: Obstetrics and Gynecology | Admitting: Obstetrics and Gynecology

## 2020-04-03 DIAGNOSIS — N83202 Unspecified ovarian cyst, left side: Secondary | ICD-10-CM | POA: Insufficient documentation

## 2020-04-03 DIAGNOSIS — N83201 Unspecified ovarian cyst, right side: Secondary | ICD-10-CM | POA: Diagnosis not present

## 2020-04-03 DIAGNOSIS — Z90710 Acquired absence of both cervix and uterus: Secondary | ICD-10-CM | POA: Diagnosis not present

## 2020-04-03 DIAGNOSIS — N838 Other noninflammatory disorders of ovary, fallopian tube and broad ligament: Secondary | ICD-10-CM | POA: Diagnosis not present

## 2020-04-03 DIAGNOSIS — N83291 Other ovarian cyst, right side: Secondary | ICD-10-CM | POA: Diagnosis not present

## 2020-04-03 NOTE — Patient Outreach (Signed)
[facility]) Care Management  04/03/2020  ZURRI RUDDEN 05-14-1946 [id number]   Telephone call to patient for disease management follow up. Patient reports she cannot talk right now as she has an appointment today.    Plan: RN CM will attempt patient again in the month of November and patient agreeable.  [name], RN, MSN [place] Management Care Management Coordinator Direct Line [phone] Cell [phone] Toll Free: [phone]  Fax: [phone]

## 2020-04-04 DIAGNOSIS — K529 Noninfective gastroenteritis and colitis, unspecified: Secondary | ICD-10-CM | POA: Diagnosis not present

## 2020-04-04 DIAGNOSIS — R197 Diarrhea, unspecified: Secondary | ICD-10-CM | POA: Diagnosis not present

## 2020-04-06 ENCOUNTER — Telehealth (HOSPITAL_COMMUNITY): Payer: Self-pay | Admitting: Pharmacist

## 2020-04-06 NOTE — Telephone Encounter (Signed)
Patient Advocate Encounter   Received notification from [facility] that prior authorization for sildenafil is required.   PA submitted on CoverMyMeds Key [id number] Status is pending   Will continue to follow.  Audry Riles, PharmD, BCPS, BCCP, CPP Heart Failure Clinic Pharmacist [phone]

## 2020-04-09 NOTE — Telephone Encounter (Signed)
Advanced Heart Failure Patient Advocate Encounter  Prior Authorization for sildenafil has been approved.    PA# [id number] Effective dates: 06/24/2019 through 06/22/2021  Audry Riles, PharmD, BCPS, BCCP, CPP Heart Failure Clinic Pharmacist [phone]

## 2020-04-10 ENCOUNTER — Encounter: Payer: Self-pay | Admitting: Family Medicine

## 2020-04-10 ENCOUNTER — Ambulatory Visit (INDEPENDENT_AMBULATORY_CARE_PROVIDER_SITE_OTHER): Payer: Medicare HMO | Admitting: Family Medicine

## 2020-04-10 ENCOUNTER — Ambulatory Visit: Payer: Self-pay

## 2020-04-10 ENCOUNTER — Other Ambulatory Visit: Payer: Self-pay

## 2020-04-10 VITALS — BP 140/70 | HR 63 | Temp 98.5°F | Ht 60.0 in | Wt 186.4 lb

## 2020-04-10 DIAGNOSIS — E1122 Type 2 diabetes mellitus with diabetic chronic kidney disease: Secondary | ICD-10-CM | POA: Diagnosis not present

## 2020-04-10 DIAGNOSIS — Z23 Encounter for immunization: Secondary | ICD-10-CM

## 2020-04-10 DIAGNOSIS — I10 Essential (primary) hypertension: Secondary | ICD-10-CM | POA: Diagnosis not present

## 2020-04-10 DIAGNOSIS — R42 Dizziness and giddiness: Secondary | ICD-10-CM

## 2020-04-10 DIAGNOSIS — R5382 Chronic fatigue, unspecified: Secondary | ICD-10-CM | POA: Diagnosis not present

## 2020-04-10 DIAGNOSIS — R197 Diarrhea, unspecified: Secondary | ICD-10-CM | POA: Diagnosis not present

## 2020-04-10 DIAGNOSIS — R55 Syncope and collapse: Secondary | ICD-10-CM | POA: Diagnosis not present

## 2020-04-10 DIAGNOSIS — I480 Paroxysmal atrial fibrillation: Secondary | ICD-10-CM | POA: Diagnosis not present

## 2020-04-10 DIAGNOSIS — K529 Noninfective gastroenteritis and colitis, unspecified: Secondary | ICD-10-CM | POA: Diagnosis not present

## 2020-04-10 LAB — COMPREHENSIVE METABOLIC PANEL
ALT: 7 U/L (ref 0–35)
AST: 13 U/L (ref 0–37)
Albumin: 4 g/dL (ref 3.5–5.2)
Alkaline Phosphatase: 133 U/L — ABNORMAL HIGH (ref 39–117)
BUN: 30 mg/dL — ABNORMAL HIGH (ref 6–23)
CO2: 31 mEq/L (ref 19–32)
Calcium: 8.9 mg/dL (ref 8.4–10.5)
Chloride: 102 mEq/L (ref 96–112)
Creatinine, Ser: 1.08 mg/dL (ref 0.40–1.20)
GFR: 49.55 mL/min — ABNORMAL LOW (ref >60.00)
Glucose, Bld: 99 mg/dL (ref 70–99)
Potassium: 3.8 mEq/L (ref 3.5–5.1)
Sodium: 139 mEq/L (ref 135–145)
Total Bilirubin: 0.4 mg/dL (ref 0.2–1.2)
Total Protein: 6.9 g/dL (ref 6.0–8.3)

## 2020-04-10 LAB — POCT GLYCOSYLATED HEMOGLOBIN (HGB A1C): Hemoglobin A1C: 5.4 % (ref 4.0–5.6)

## 2020-04-10 LAB — CBC
HCT: 31.8 % — ABNORMAL LOW (ref 36.0–46.0)
Hemoglobin: 10.1 g/dL — ABNORMAL LOW (ref 12.0–15.0)
MCHC: 31.8 g/dL (ref 30.0–36.0)
MCV: 87.9 fl (ref 78.0–100.0)
Platelets: 187 10*3/uL (ref 150.0–400.0)
RBC: 3.62 Mil/uL — ABNORMAL LOW (ref 3.87–5.11)
RDW: 13.3 % (ref 11.5–15.5)
WBC: 5.6 10*3/uL (ref 4.0–10.5)

## 2020-04-10 LAB — VITAMIN B12: Vitamin B-12: 489 pg/mL (ref 211–911)

## 2020-04-10 LAB — TSH: TSH: 0.98 u[IU]/mL (ref 0.35–4.50)

## 2020-04-10 NOTE — Assessment & Plan Note (Signed)
BP is stable today. Orthostatics completed. Continue to monitor.

## 2020-04-10 NOTE — Patient Instructions (Signed)
Nice to see you. We'll get lab work today. I have referred you back to cardiology. If you have recurrent symptoms or if you pass out you need to go the emergency department for evaluation.

## 2020-04-10 NOTE — Assessment & Plan Note (Signed)
Could be related to her A. fib. We'll check lab work. We'll also have her follow-up with cardiology.

## 2020-04-10 NOTE — Assessment & Plan Note (Signed)
Check A1c. 

## 2020-04-10 NOTE — Progress Notes (Signed)
[name], MD Phone: [phone]  Debra Saunders is a 74 y.o. female who presents today for f/u.  Diarrhea: Chronic issue. She saw GI recently and they started her on cholestyramine. This has helped her have more formed stools. She is still going 6-8 times per day. She is able to make it to the bathroom easily now.  Fatigue: Patient notes this typically occurs after she has a bowel movement. She'll feel just drained. Also feels tired at other times. Does have a history of hypothyroidism. No shortness of breath or chest pain. Does note occasional palpitations related to her A. fib. Palpitations only last about 2 minutes and resolve on their own. She does take digoxin and Eliquis. No bleeding issues.  Presyncope: Patient notes yesterday she had an episode where she was sitting there just watching TV and then suddenly felt as though she was about to pass out. Her vision got dark. She had no chest pain or breathing issues with this. No palpitations. This resolved on its own after about 10 minutes. It did not feel like a shade was dropping over her eyes. She checked her blood sugar and notes it had not dropped. She has had no recurrent symptoms. She does note in the past she is felt that way when she is eating too much salt.  Social History   Tobacco Use  Smoking Status Former Smoker  . Packs/day: 0.30  . Years: 2.00  . Pack years: 0.60  . Types: Cigarettes  . Quit date: 06/23/1990  . Years since quitting: 29.8  Smokeless Tobacco Never Used     ROS see history of present illness  Objective  Physical Exam Vitals:   04/10/20 1307  BP: 140/70  Pulse: 63  Temp: 98.5 F (36.9 C)  SpO2: 97%    BP Readings from Last 3 Encounters:  04/10/20 140/70  03/26/20 122/61  02/16/20 122/78   Wt Readings from Last 3 Encounters:  04/10/20 186 lb 6.4 oz (84.6 kg)  03/26/20 186 lb (84.4 kg)  01/30/20 182 lb (82.6 kg)    Physical Exam Constitutional:      General: She is not in  acute distress.    Appearance: She is not diaphoretic.  Cardiovascular:     Rate and Rhythm: Normal rate. Rhythm irregularly irregular.     Heart sounds: Normal heart sounds.  Pulmonary:     Effort: Pulmonary effort is normal.     Breath sounds: Normal breath sounds.  Skin:    General: Skin is warm and dry.  Neurological:     Mental Status: She is alert.    EKG: AV dual paced rhythm with prolonged AV conduction, rate 64, apparent PVC versus PAC x2  Assessment/Plan: Please see individual problem list.  Problem List Items Addressed This Visit    Atrial fibrillation ([place]) - Primary (Chronic)    Rate controlled on exam. Concern is that her presyncope may have been related to an A. fib issue. EKG performed today. We'll get lab work to evaluate for other causes. Referral back to cardiology. Continue Eliquis 5 mg twice daily. Digoxin is managed by cardiology.      Relevant Medications   cholestyramine (QUESTRAN) 4 g packet   Other Relevant Orders   Ambulatory referral to Cardiology   Chronic diarrhea    Chronic issues with this. She has had some benefit from the cholestyramine. She'll continue that and continue to see GI.      Diabetes (HCC)    Check A1c.  Relevant Orders   Comp Met (CMET)   HgB A1c   Fatigue    Could be related to her A. fib. We'll check lab work. We'll also have her follow-up with cardiology.      Relevant Orders   Comp Met (CMET)   CBC   TSH   B12   Hypertension (Chronic)    BP is stable today. Orthostatics completed. Continue to monitor.      Relevant Medications   cholestyramine (QUESTRAN) 4 g packet   Other Relevant Orders   Comp Met (CMET)    Other Visit Diagnoses    Need for immunization against influenza       Relevant Orders   Flu Vaccine QUAD High Dose(Fluad) (Completed)   Postural dizziness with presyncope       Relevant Medications   cholestyramine (QUESTRAN) 4 g packet   Other Relevant Orders   EKG 12-Lead   Ambulatory  referral to Cardiology      This visit occurred during the SARS-CoV-2 public health emergency.  Safety protocols were in place, including screening questions prior to the visit, additional usage of staff PPE, and extensive cleaning of exam room while observing appropriate contact time as indicated for disinfecting solutions.    [name], MD [place]

## 2020-04-10 NOTE — Assessment & Plan Note (Addendum)
Rate controlled on exam. Concern is that her presyncope may have been related to an A. fib issue. EKG performed today. We'll get lab work to evaluate for other causes. Referral back to cardiology. Continue Eliquis 5 mg twice daily. Digoxin is managed by cardiology.

## 2020-04-10 NOTE — Addendum Note (Signed)
Addended by: Leeanne Rio on: 04/10/2020 02:20 PM   Modules accepted: Orders

## 2020-04-10 NOTE — Assessment & Plan Note (Signed)
Chronic issues with this. She has had some benefit from the cholestyramine. She'll continue that and continue to see GI.

## 2020-04-11 ENCOUNTER — Inpatient Hospital Stay: Payer: Medicare HMO

## 2020-04-11 ENCOUNTER — Inpatient Hospital Stay (HOSPITAL_BASED_OUTPATIENT_CLINIC_OR_DEPARTMENT_OTHER): Payer: Medicare HMO | Admitting: Obstetrics and Gynecology

## 2020-04-11 VITALS — BP 123/59 | HR 43 | Temp 98.0°F | Resp 20 | Wt 187.5 lb

## 2020-04-11 DIAGNOSIS — N83202 Unspecified ovarian cyst, left side: Secondary | ICD-10-CM

## 2020-04-11 DIAGNOSIS — D509 Iron deficiency anemia, unspecified: Secondary | ICD-10-CM | POA: Diagnosis not present

## 2020-04-11 DIAGNOSIS — I4819 Other persistent atrial fibrillation: Secondary | ICD-10-CM | POA: Diagnosis not present

## 2020-04-11 DIAGNOSIS — Z8673 Personal history of transient ischemic attack (TIA), and cerebral infarction without residual deficits: Secondary | ICD-10-CM | POA: Diagnosis not present

## 2020-04-11 DIAGNOSIS — E538 Deficiency of other specified B group vitamins: Secondary | ICD-10-CM | POA: Diagnosis not present

## 2020-04-11 DIAGNOSIS — I509 Heart failure, unspecified: Secondary | ICD-10-CM | POA: Diagnosis not present

## 2020-04-11 DIAGNOSIS — N183 Chronic kidney disease, stage 3 unspecified: Secondary | ICD-10-CM | POA: Diagnosis not present

## 2020-04-11 DIAGNOSIS — N83201 Unspecified ovarian cyst, right side: Secondary | ICD-10-CM | POA: Diagnosis not present

## 2020-04-11 DIAGNOSIS — I130 Hypertensive heart and chronic kidney disease with heart failure and stage 1 through stage 4 chronic kidney disease, or unspecified chronic kidney disease: Secondary | ICD-10-CM | POA: Diagnosis not present

## 2020-04-11 NOTE — Patient Instructions (Signed)
Follow up with [name] in one year (around 03/2021) or sooner if increasing symptoms. It was a pleasure seeing you today and thank you for allowing [place] to participate in your care. -Beckey Rutter, NP & Dr. Fransisca Connors

## 2020-04-11 NOTE — Progress Notes (Signed)
Gynecologic Oncology Interval Visit   Referring Provider: Dr. Prentice Docker  Chief Complaint: Bilateral ovarian cysts with elevated Roma  Subjective:  Debra Saunders is a 74 y.o. female who is seen in consultation from [name] for bilateral ovarian cysts with elevated ROMA score (35%) who returns to clinic for re-evaluation and discussion of ultrasound results.   04/03/20- US Pelvic Complete Uterus: Absent  Right Ovary- No significant ovarian tissue is identified. Multiple simple cysts are seen within the right adnexa, similar to previous examination, measuring up to 2.1 x 1.8 x 1.9 cm. No significant associated mural nodularity, septa, or internal vascularity is identified. Left Ovary- similar to previous exam. No significant ovarian tissue identified. Multiple simple cysts which in conglomerate measure 3.3 x 2.2 x 3.3 cm similar to prior.  No abnormal free fluid.   No new complaints.  Gynecologic History  Debra Saunders is a pleasant female who is seen in consultation from [name] for bilateral ovarian cysts with elevated ROMA score.   Patient initially presented for bilateral ovarian cyst, asymptomatic and had been incidentally found on CT in October.  Per patient, cyst had been seen in 2014 and patient had not followed up. She had a hysterectomy '50 years ago' for heavy bleeding/presumed benign disease.   CT-04/13/2020 Left ovary: Low-attenuation left ovarian lesion measuring 3.3 x 2.4 cm.  Possible small paraovarian cyst on the left is 1.9 cm Right ovary: Low-attenuation right ovarian lesion is 1.7 cm.  Possible paraovarian cyst on the right is 2.3 cm No free pelvic fluid.  08/17/2019-ultrasound pelvis complete with transvaginal Left ovary: 3.6 x 2.7 x 3.0 cm hyperechoic nodule within left ovary measuring 2.7 x 2.1 x 2.2 cm likely cyst either complicated by internal echoes or containing scattered artifacts.  No septations or mural nodularity Right ovary: Cystic  structure within the right adnexa 2.4 x 2.0 x 2.3 cm, simple features, question ovarian versus paraovarian cyst.  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 fib on Eliquis, pacemaker for symptomatic bradycardia, chronic respiratory failure with hypoxia on home oxygen secondary to COPD, pulmonary hypertension, interstitial lung disease, type 2 diabetes, obesity, subarachnoid hemorrhage in setting of Xarelto, IVC filter, DVT, left lower extremity lymphedema, prior stroke, sleep apnea.   At her consult visit she also noted multiple vulvar lesions and wanted to biopsy the most symptomatic area.   vulvar biopsy performed on 10/05/2019.   DIAGNOSIS:  A. VULVA; BIOPSY:  - BENIGN ANGIOKERATOMA.  - NEGATIVE FOR DYSPLASIA AND MALIGNANCY.   Problem List: Patient Active Problem List   Diagnosis Date Noted  . Chronic diarrhea 11/04/2019  . Dysuria 09/29/2019  . Abnormal mammogram 09/29/2019  . Breast pain, right 08/08/2019  . Bilateral ovarian cysts 08/08/2019  . B12 deficiency 08/02/2019  . Iron deficiency 08/01/2019  . Dizziness 07/06/2019  . Shortness of breath   . Atrial fibrillation ([place]) 09/22/2017  . Acute bilateral low back pain without sciatica 01/28/2017  . Pain in limb 07/22/2016  . Neuropathy 06/09/2016  . Pain in the chest 03/10/2016  . Chronic deep vein thrombosis (DVT) (HCC) 03/03/2016  . Chronic back pain 10/19/2015  . Goiter 04/25/2015  . Cervical disc disorder with radiculopathy of cervical region 03/30/2015  . Elevated LFTs 06/29/2014  . 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 Abnormality of gait 01/13/2014  . Sinoatrial node dysfunction (  [place]) 01/09/2014  . Allergic rhinitis 10/26/2013  . Lymphedema 08/03/2013  . Constipation 04/12/2013  . Pulmonary embolus ([place]) 01/19/2013  . Musculoskeletal chest pain 01/19/2013  .  Fatigue 09/27/2012  . Anemia 04/28/2012  . Chronic respiratory failure with hypoxia ([place]) 04/26/2012  . Diabetes ([place]) 03/30/2012  . Pulmonary hypertension, unspecified ([place]) 03/30/2012  . Hyperlipidemia 03/30/2012  . Hypothyroidism 03/30/2012  . Chronic systolic heart failure ([place]) 01/22/2012  . Hypertension     Past Medical History: Past Medical History:  Diagnosis Date  . Arthritis   . Atrial fibrillation, persistent ([place])    a. s/p TEE-guided DCCV on 08/20/2017  . Cardiorenal syndrome    a. 09/2017 Creat rose to 2.7 w/ diuresis-->HD x 2.  . Chest pain    a. 01/2012 Cath: nonobs dzs;  c. 04/2014 MV: No ischemia.  . Chronic back pain   . Chronic combined systolic (congestive) and diastolic (congestive) heart failure ([place])    a. Dx 2005 @ [facility];  b. 02/2014 Echo: EF 55-60%; c. 07/2017: EF 30-35%, diffuse HK, trivial AI, severe MR, moderate TR; d. 09/2017 Echo: EF 40-45%; e. 02/2018 Echo: EF 55-60%, Gr1 DD; f. 08/2019 Echo: EF 60-65%, Gr2 DD, Nl RV fxn, RVSP 49.62mmHg. Mod-Sev MR. Mild to mod TR. Mod PR. RA ~ 65mmHg.  [name] Chronic respiratory failure (HCC)    a. on home O2  . COPD (chronic obstructive pulmonary disease) ([place])    a. Home O2 - 3lpm  . Gastritis   . History of intracranial hemorrhage    a. 6/14 described by neurosurgery as "small frontal posttraumatic SAH " - pt was on xarelto @ the time.  . Hyperlipidemia   . Hypertension   . Interstitial lung disease ([place])   . Lipoma of back   . Lymphedema    a. Chronic LLE edema.  [name] NICM (nonischemic cardiomyopathy) ([place])    a. 09/2017 Echo: EF 40-45%; b. 09/2017 Cath: minor, insignificant CAD; c. 02/2018 Echo: EF 55-60%, Gr1 DD; d. 08/2019 Echo: EF 60-65%, Gr2 DD.  [name] Obesity   . PAH (pulmonary artery hypertension) ([place])    a. 09/2017 Echo: PASP 79mmHg; b. 09/2017 RHC: RA 23, RV 84/13, PA 84/29, PCWP 20, CO 3.89, CI 1.89. LVEDP 18.  . Sepsis with metabolic encephalopathy ([place])   . Sleep apnea   . Streptococcal bacteremia   . Stroke ([place])    . Subarachnoid hemorrhage ([place]) 01/19/2013  . Symptomatic bradycardia    a. s/p BSX PPM.  . Thyroid nodule   . Type II diabetes mellitus ([place])   . Urinary incontinence     Past Surgical History: Past Surgical History:  Procedure Laterality Date  . ABDOMINAL HYSTERECTOMY  1969  . BREAST BIOPSY Right 10/28/2019   lymph node bx, [place] Bx, pending path   . CARDIAC CATHETERIZATION  2013   @ [place]: No obstructive CAD: Only 20% ostial left circumflex.  [name] CARDIOVERSION N/A 10/12/2017   Procedure: CARDIOVERSION;  Surgeon: Minna Merritts, MD;  Location: [facility] ORS;  Service: Cardiovascular;  Laterality: N/A;  . CHOLECYSTECTOMY    . COLONOSCOPY WITH PROPOFOL N/A 09/10/2018   Procedure: COLONOSCOPY WITH PROPOFOL;  Surgeon: Manya Silvas, MD;  Location: [facility] ENDOSCOPY;  Service: Endoscopy;  Laterality: N/A;  . CYST REMOVAL TRUNK  2002   BACK  . DIALYSIS/PERMA CATHETER INSERTION N/A 10/06/2017   Procedure: DIALYSIS/PERMA CATHETER INSERTION;  Surgeon: Katha Cabal, MD;  Location: [place] CV LAB;  Service: Cardiovascular;  Laterality: N/A;  . INSERT /  REPLACE / REMOVE PACEMAKER    . lipoma removal  2014   back  . PACEMAKER INSERTION  [address] Scientific dual chamber pacemaker implanted by Dr Caryl Comes for symptomatic bradycardia  . PERMANENT PACEMAKER INSERTION N/A 01/09/2014   Procedure: PERMANENT PACEMAKER INSERTION;  Surgeon: Deboraha Sprang, MD;  Location: [facility] CATH LAB;  Service: Cardiovascular;  Laterality: N/A;  . RIGHT HEART CATH N/A 04/19/2018   Procedure: RIGHT HEART CATH;  Surgeon: Larey Dresser, MD;  Location: [place] CV LAB;  Service: Cardiovascular;  Laterality: N/A;  . RIGHT/LEFT HEART CATH AND CORONARY ANGIOGRAPHY N/A 10/19/2017   Procedure: RIGHT/LEFT HEART CATH AND CORONARY ANGIOGRAPHY;  Surgeon: Wellington Hampshire, MD;  Location: [facility] CV LAB;  Service: Cardiovascular;  Laterality: N/A;  . TEE WITHOUT CARDIOVERSION N/A 08/20/2017   Procedure:  TRANSESOPHAGEAL ECHOCARDIOGRAM (TEE) & Direct current cardioversion;  Surgeon: Minna Merritts, MD;  Location: [facility] ORS;  Service: Cardiovascular;  Laterality: N/A;  . TRIGGER FINGER RELEASE      Past Gynecologic History:  G4 P4 Postmenopausal Hysterectomy   OB History:  OB History  Gravida Para Term Preterm AB Living  4         4  SAB TAB Ectopic Multiple Live Births               # Outcome Date GA Lbr Len/2nd Weight Sex Delivery Anes PTL Lv  4 Gravida           3 Gravida           2 Gravida           1 Gravida             Obstetric Comments  Age first pregnancy 34    Family History: Family History  Problem Relation Age of Onset  . Heart disease Mother   . Hypertension Mother   . COPD Mother        was a smoker  . Thyroid disease Mother   . Hypertension Father   . Hypertension Sister   . Thyroid disease Sister   . Breast cancer Sister   . Hypertension Sister   . Thyroid disease Sister   . Bone cancer Sister   . Breast cancer Maternal Aunt   . Kidney disease Neg Hx   . Bladder Cancer Neg Hx     Social History: Social History   Socioeconomic History  . Marital status: Divorced    Spouse name: Not on file  . Number of children: 4  . Years of education: 36  . Highest education level: High school graduate  Occupational History  . Occupation: Retired- factory work    Comment: exposed to Pharmacist, community  Tobacco Use  . Smoking status: Former Smoker    Packs/day: 0.30    Years: 2.00    Pack years: 0.60    Types: Cigarettes    Quit date: 06/23/1990    Years since quitting: 29.8  . Smokeless tobacco: Never Used  Vaping Use  . Vaping Use: Never used  Substance and Sexual Activity  . Alcohol use: No  . Drug use: No  . Sexual activity: Not Currently  Other Topics Concern  . Not on file  Social History Narrative   Lives in [place] with daughter. Has 4 children. No pets.      Work - Restaurant manager, fast food, retired      Diet - regular      02/14/2019: reports  has Meals On Wheels  delivered to her home;       Remote hx of smoking > 30 years; No alcohol-    Social Determinants of Health   Financial Resource Strain: Low Risk   . Difficulty of Paying Living Expenses: Not very hard  Food Insecurity: No Food Insecurity  . Worried About Charity fundraiser in the Last Year: Never true  . Ran Out of Food in the Last Year: Never true  Transportation Needs: Unmet Transportation Needs  . Lack of Transportation (Medical): Yes  . Lack of Transportation (Non-Medical): No  Physical Activity:   . Days of Exercise per Week: Not on file  . Minutes of Exercise per Session: Not on file  Stress:   . Feeling of Stress : Not on file  Social Connections:   . Frequency of Communication with Friends and Family: Not on file  . Frequency of Social Gatherings with Friends and Family: Not on file  . Attends Religious Services: Not on file  . Active Member of Clubs or Organizations: Not on file  . Attends Archivist Meetings: Not on file  . Marital Status: Not on file  Intimate Partner Violence:   . Fear of Current or Ex-Partner: Not on file  . Emotionally Abused: Not on file  . Physically Abused: Not on file  . Sexually Abused: Not on file    Allergies: Allergies  Allergen Reactions  . Aspirin Hives and Other (See Comments)    Pt states that she is unable to take because she had bleeding in her brain  . Other Other (See Comments)    Pt states that she is unable to take blood thinners because she had bleeding in her brain; Blood Thinners-per doctor at [facility]  . Penicillins Hives, Shortness Of Breath, Swelling and Other (See Comments)    Has patient had a PCN reaction causing immediate rash, facial/tongue/throat swelling, SOB or lightheadedness with hypotension: Yes Has patient had a PCN reaction causing severe rash involving mucus membranes or skin necrosis: No Has patient had a PCN reaction that required hospitalization No Has patient had a PCN  reaction occurring within the last 10 years: No If all of the above answers are "NO", then may proceed with Cephalosporin use.    Current Medications: Current Outpatient Medications  Medication Sig Dispense Refill  . acetaminophen (TYLENOL) 325 MG tablet Take 325-650 mg by mouth every 6 (six) hours as needed for mild pain. Reported on 10/10/2015    . albuterol (PROVENTIL) (2.5 MG/3ML) 0.083% nebulizer solution Take 3 mLs (2.5 mg total) by nebulization every 6 (six) hours. (Patient taking differently: Take 2.5 mg by nebulization every 6 (six) hours as needed. ) 75 mL 12  . apixaban (ELIQUIS) 5 MG TABS tablet Take 1 tablet (5 mg total) by mouth 2 (two) times daily. 60 tablet 2  . cholestyramine (QUESTRAN) 4 g packet Take by mouth.    . digoxin (LANOXIN) 0.125 MG tablet Take 1 tablet by mouth once daily 90 tablet 3  . gabapentin (NEURONTIN) 100 MG capsule Take 2 capsules (200 mg total) by mouth 3 (three) times daily. 180 capsule 0  . glucose blood test strip Use with True Metrix glucometer to check sugars BID 200 each 3  . Lancets MISC Use with True Metrix glucometer to check sugars BID 200 each 3  . macitentan (OPSUMIT) 10 MG tablet Take 1 tablet (10 mg total) by mouth daily. 30 tablet 5  . meclizine (ANTIVERT) 25 MG tablet Take 1 tablet (  25 mg total) by mouth 3 (three) times daily as needed for dizziness. 30 tablet 0  . Menthol, Topical Analgesic, (BIOFREEZE EX) Apply 1 application topically as needed (for pain).     . ondansetron (ZOFRAN) 8 MG tablet Take 1 tablet (8 mg total) by mouth every 8 (eight) hours as needed for nausea or vomiting. 30 tablet 2  . OXYGEN Inhale 4 L into the lungs continuous.    . potassium chloride SA (KLOR-CON) 20 MEQ tablet Take 1 tablet (20 mEq total) by mouth daily. 90 tablet 0  . rosuvastatin (CRESTOR) 10 MG tablet Take 1 tablet (10 mg total) by mouth daily. 90 tablet 3  . Selexipag (UPTRAVI) 1600 MCG TABS Take 1,600 mcg by mouth 2 (two) times daily.    .  sildenafil (REVATIO) 20 MG tablet Take 1 tablet (20 mg total) by mouth 3 (three) times daily. 270 tablet 3  . spironolactone (ALDACTONE) 25 MG tablet Take 1 tablet (25 mg total) by mouth at bedtime. Please call for a office visit [phone] 90 tablet 0  . torsemide (DEMADEX) 20 MG tablet Take 2 tablets (40 mg total) by mouth 2 (two) times daily.     No current facility-administered medications for this visit.   Review of Systems General:  no complaints Skin: no complaints Eyes: no complaints HEENT: no complaints Breasts: no complaints Pulmonary: no complaints Cardiac: no complaints Gastrointestinal: no complaints Genitourinary/Sexual: no complaints Ob/Gyn: no complaints Musculoskeletal: no complaints Hematology: no complaints Neurologic/Psych: no complaints   Objective:  Physical Examination:  BP (!) 123/59   Pulse (!) 43   Temp 98 F (36.7 C)   Resp 20   Wt 187 lb 8 oz (85 kg)   SpO2 100%   BMI 36.62 kg/m     ECOG Performance Status: 1 - Symptomatic but completely ambulatory.   GENERAL: Appears stated age. In wheelchair. Wears supplemental oxygen HEENT:  PERRL, neck supple with midline trachea. Thyroid without masses.  NODES:  No cervical, supraclavicular, axillary, or inguinal lymphadenopathy palpated.  LUNGS:  Clear to auscultation bilaterally.  No wheezes or rhonchi. HEART:  Regular rate and rhythm. No murmur appreciated. ABDOMEN:  Soft, nontender.  Positive, normoactive bowel sounds.  MSK:  No focal spinal tenderness to palpation. Full range of motion bilaterally in the upper extremities. EXTREMITIES:  No peripheral edema.   SKIN:  Clear with no obvious rashes or skin changes. No nail dyscrasia. NEURO:  Nonfocal. Well oriented.  Appropriate affect.  Pelvic: Exam chaperoned by nursing EGBUS: scattered vulvar lesions, irregular shape and size.  Bimanual: No masses.  RV: confirms.  Lab Review No labs on site today  Radiologic Imaging: No labs on site  today    Assessment:  Debra Saunders is a 74 y.o. female diagnosed with asymptomatic bilateral ovarian cysts < 4 cm with elevated ROMA score. Elevated creatinine limits utility of HE4 testing. CA125 normal is reassuring as well as short term stability. She has multiple comorbid conditions. Pelvic US recently stable. Normal exam.   Vulvar lesions, biopsy performed and demonstrated benign findings.   Medical co-morbidities complicating care: Type II diabetes mellitus ([place]); Atrial fibrillation, persistent ([place]); Cardiorenal syndrome (09/2017 Creat rose to 2.7 w/ diuresis-->HD x 2); Chronic combined systolic (congestive) and diastolic (congestive) heart failure (HCC); COPD (chronic obstructive pulmonary disease) ([place]); Hypertension; History of intracranial hemorrhage; prior surgery Plan:   Problem List Items Addressed This Visit    None    Visit Diagnoses    Cysts of both ovaries    -  Primary     Will check CA125 and HE4 to make sue they are stable.  We do not recommended repeat ultrasounds at this point and she can return to see Dr Glennon Mac in one year.   The patient's diagnosis, an outline of the further diagnostic and laboratory studies which will be required, the recommendation for surgery, and alternatives were discussed with her and her accompanying family members.  All questions were answered to their satisfaction.  I personally saw the patient and performed a substantive portion of this encounter in conjunction with the listed APP as documented above.  Verlon Au, NP   I personally interviewed and examined the patient. Agreed with the above/below plan of care. I have directly contributed to assessment and plan of care of this patient and educated and discussed with patient and family.  Mellody Drown, MD

## 2020-04-12 ENCOUNTER — Telehealth: Payer: Self-pay

## 2020-04-12 ENCOUNTER — Other Ambulatory Visit: Payer: Self-pay | Admitting: Family Medicine

## 2020-04-12 DIAGNOSIS — R748 Abnormal levels of other serum enzymes: Secondary | ICD-10-CM

## 2020-04-12 LAB — CA 125: Cancer Antigen (CA) 125: 19.1 U/mL (ref 0.0–38.1)

## 2020-04-12 NOTE — Telephone Encounter (Signed)
-----   Message from [name], MD sent at 04/12/2020  9:13 AM EDT ----- Please let the patient know her alkaline phosphatase is mildly elevated.  I would like to do additional labs to evaluate that.  Orders placed.  Her kidney function is stable.  Her anemia has actually improved slightly.

## 2020-04-13 LAB — HUMAN EPIDIDYMIS PROT 4,SERIAL: HE4: 185 pmol/L — ABNORMAL HIGH (ref 0.0–96.9)

## 2020-04-19 ENCOUNTER — Other Ambulatory Visit: Payer: Self-pay | Admitting: Family Medicine

## 2020-04-19 ENCOUNTER — Encounter (HOSPITAL_COMMUNITY): Payer: Self-pay

## 2020-04-19 ENCOUNTER — Other Ambulatory Visit (HOSPITAL_COMMUNITY): Payer: Self-pay | Admitting: Cardiology

## 2020-04-19 ENCOUNTER — Other Ambulatory Visit (HOSPITAL_COMMUNITY): Payer: Self-pay

## 2020-04-19 MED ORDER — SPIRONOLACTONE 25 MG PO TABS
25.0000 mg | ORAL_TABLET | Freq: Every day | ORAL | 2 refills | Status: DC
Start: 2020-04-19 — End: 2021-02-04

## 2020-04-19 NOTE — Progress Notes (Signed)
Today had a home visit with [name].  She states been doing better.  She does not have as much diarrhea, her appetite is better.  She has all her medications, filled her med boxes.  She needed refills on gabapentin, spironolactone, uptravi and eliquis.  Called and got refills ordered for her.  [name] will be delivered tomorrow and other will be ready at pharmacy to be picked up tomorrow evening.  She denies any chest pain, headaches, dizziness or more shortness of breath.  She is ambulatory by rollator.  She is aware of up coming appts.  She has everything for daily living.  She has a lot of family support.  Will continue to visit for heart failure, diet and medication management.   [place] [phone]

## 2020-04-20 ENCOUNTER — Other Ambulatory Visit (INDEPENDENT_AMBULATORY_CARE_PROVIDER_SITE_OTHER): Payer: Medicare HMO

## 2020-04-20 ENCOUNTER — Telehealth: Payer: Self-pay | Admitting: Family Medicine

## 2020-04-20 ENCOUNTER — Other Ambulatory Visit: Payer: Self-pay

## 2020-04-20 DIAGNOSIS — R197 Diarrhea, unspecified: Secondary | ICD-10-CM | POA: Diagnosis not present

## 2020-04-20 DIAGNOSIS — R748 Abnormal levels of other serum enzymes: Secondary | ICD-10-CM

## 2020-04-20 DIAGNOSIS — K529 Noninfective gastroenteritis and colitis, unspecified: Secondary | ICD-10-CM | POA: Diagnosis not present

## 2020-04-20 MED ORDER — SILVER SULFADIAZINE 1 % EX CREA: 1.0000 "application " | TOPICAL_CREAM | Freq: Every day | CUTANEOUS | 0 refills | Status: DC
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
## 2020-04-24 LAB — ALKALINE PHOSPHATASE, ISOENZYMES
Alkaline Phosphatase: 163 IU/L — ABNORMAL HIGH (ref 44–121)
BONE FRACTION: 37 % (ref 14–68)
INTESTINAL FRAC.: 4 % (ref 0–18)
LIVER FRACTION: 59 % (ref 18–85)

## 2020-04-30 ENCOUNTER — Other Ambulatory Visit: Payer: Self-pay

## 2020-04-30 ENCOUNTER — Ambulatory Visit (INDEPENDENT_AMBULATORY_CARE_PROVIDER_SITE_OTHER): Payer: Medicare HMO | Admitting: Family Medicine

## 2020-04-30 ENCOUNTER — Encounter: Payer: Self-pay | Admitting: Family Medicine

## 2020-04-30 DIAGNOSIS — E669 Obesity, unspecified: Secondary | ICD-10-CM

## 2020-04-30 DIAGNOSIS — R748 Abnormal levels of other serum enzymes: Secondary | ICD-10-CM | POA: Insufficient documentation

## 2020-04-30 DIAGNOSIS — T300 Burn of unspecified body region, unspecified degree: Secondary | ICD-10-CM | POA: Insufficient documentation

## 2020-04-30 NOTE — Progress Notes (Signed)
Pre visit review using our clinic review tool, if applicable. No additional management support is needed unless otherwise documented below in the visit note. 

## 2020-04-30 NOTE — Assessment & Plan Note (Signed)
This is healed quite well.  She can continue the Silvadene if she feels as though she needs it in the short-term.  Discussed she likely would not need it in the next week.  She will monitor.

## 2020-04-30 NOTE — Assessment & Plan Note (Signed)
Ongoing issue.  Fractionation did not point [place] in a specific direction for work-up.  We will reach out to her GI physician to get his input.

## 2020-04-30 NOTE — Patient Instructions (Signed)
Nice to see you. You can continue to use the Silvadene cream as needed.  I suspect you will need this after the next week. I will have the pharmacist look at your medicines to see if they are contributing to your hunger.
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
## 2020-04-30 NOTE — Assessment & Plan Note (Signed)
Patient will stay active as she is able to within her limitations.  Discussed monitoring her diet.  We will have our clinical pharmacist review her medicines to see if those are contributing to her hyperphagia.

## 2020-05-02 ENCOUNTER — Other Ambulatory Visit (HOSPITAL_COMMUNITY): Payer: Self-pay

## 2020-05-02 ENCOUNTER — Encounter (HOSPITAL_COMMUNITY): Payer: Self-pay

## 2020-05-02 NOTE — Progress Notes (Signed)
Today had a home visit with Debra Saunders.  She states doing ok, she is aware of appt with cardiology on Friday.  She states Debra Saunders noticed she is in afib.  Pulse is irregular today.  She has gained some weight.  She states she can feel in her chest it is irregular.  She states she is eating everything.  She has baked cookies, cooked meals that are not so healthy.  She states been craving sweets.  She states appetite is good but does hurt her stomach.  Diarrhea is better but still has some. She denies any chest pain.  She has some swelling in legs and ankles.  She has all her medications and has been taking them.  Refilled her med boxes and no refills needed.  Will continue to visit for heart failure, diet and medication compliance.   [place] [phone]

## 2020-05-03 ENCOUNTER — Ambulatory Visit: Payer: Medicare HMO | Admitting: Pharmacist

## 2020-05-03 DIAGNOSIS — E669 Obesity, unspecified: Secondary | ICD-10-CM

## 2020-05-03 DIAGNOSIS — E1122 Type 2 diabetes mellitus with diabetic chronic kidney disease: Secondary | ICD-10-CM

## 2020-05-03 DIAGNOSIS — I5022 Chronic systolic (congestive) heart failure: Secondary | ICD-10-CM

## 2020-05-03 DIAGNOSIS — I272 Pulmonary hypertension, unspecified: Secondary | ICD-10-CM

## 2020-05-03 DIAGNOSIS — I10 Essential (primary) hypertension: Secondary | ICD-10-CM

## 2020-05-03 MED ORDER — TRULICITY 0.75 MG/0.5ML ~~LOC~~ SOAJ: 0.7500 mg | SUBCUTANEOUS | 2 refills | Status: DC

## 2020-05-03 NOTE — Patient Instructions (Addendum)
Visit Information  Patient Care Plan: Pharmacy - Prevention of Disease Progression    Problem Identified: Pharmacy - Prevention of Disease Progression     Long-Range Goal: Disease Progression Prevented or Minimized   This Visit's Progress: On track  Priority: High  Note:   Current Barriers:  . Unable to maintain control of glycemic control . Polypharmacy, complex patient with multiple comorbidities   Pharmacist Clinical Goal(s):  [name] Over the next 90 days, patient will work with PharmD and provider towards optimized glycemic control . Over the next 90 days, patient will work with PharmD and provider towards optimized medication management  Review and Interventions  Medication Management . Comprehensive medication review performed; medication list updated in electronic medical record . Encouraged continued collaborate w/ [name], EMT for pill box fills and adherence visits  Diabetes: . Controlled per last A1c, though most recent glucose readings elevated. Patient reports that since improvement of diarrhea, she has had an improvement in her appetite; current treatment: none;  . Current glucose readings: fasting glucose: 160-180s, post prandial glucose: 200s . Denies hyperglycemic symptoms of polydipsia, polyuria/increased nocturia. Just reports increased appetite.  . Does report that she has a home supply of [name] from previous application for patient assistance . Peripheral neuropathy: gabapentin 200 mg TID . Collaborated with PCP.  Start [name] 7.54 mg weekly. Patient has supply at home. Will monitor sugars and appetite  Hypertension, Heart Failure, Pulmonary Hypertension . Managed by [facility], Cardiology; Current treatment: digoxin 0.125 mg daily, Opsumit 10 mg daily, potassium 20 mEq daily, Uptravi 1600 mcg BID, sildenafil citrate 20 mg TID, spironolactone 25 mg daily, torsemide 60 mg BID;  . Home weights: most recent 190 lbs. Reports she has not  been weighing daily because "I hate to see the weight gain" . Counseled on daily weights and importance of knowing if weight gain >3 lbs in 1 day, >5 lbs in 1 week   Hyperlipidemia: . Controlled; current treatment: rosuvastatin 10 mg daily;  . Recommended continue current regimen  Atrial Fibrillation: . Controlled; anticoagulant treatment: Eliquis 5 mg BID . Recommended continue current regimen  Chronic Diarrhea: . Improved, current treatment cholestyramine 4 g daily.  . Meclizine PRN dizziness/nausea, ondansetron PRN nausea/vomiting.    Inter-disciplinary care team collaboration (see longitudinal plan of care)  Patient Goals/Self-Care Activities . Over the next 90 days, patient will:  - take medications as prescribed check blood glucose BID, document, and provide at future appointments weight daily, and contact provider if weight gain of > 3 lbs in 1 day, >5 lbs in 1 week  Follow Up Plan: Telephone follow up appointment with care management team member scheduled for: ~ 3 weeks      The patient verbalized understanding of instructions provided today and declined a print copy of patient instruction materials.   Telephone follow up appointment with care management team member scheduled for:~ 3 weeks  [name], PharmD, [place], [place] [phone] \

## 2020-05-03 NOTE — Chronic Care Management (AMB) (Signed)
Chronic Care Management   Note  05/03/2020 Name: Debra Saunders MRN: [id number] DOB: 05/31/1946   Subjective:  Debra Saunders is a 74 y.o. year old female who is a primary care patient of [name], [name], MD. The CCM team was consulted for assistance with chronic disease management and care coordination needs.    Connected with patient by telephone for follow up in response to provider referral for pharmacy case management and/or care coordination services.    Review of patient status, including review of consultants reports, laboratory and other test data, was performed as part of comprehensive evaluation and provision of chronic care management services.   SDOH (Social Determinants of Health) assessments and interventions performed:  SDOH Interventions     Most Recent Value  SDOH Interventions  Financial Strain Interventions Other (Comment)       Objective:  Lab Results  Component Value Date   CREATININE 1.08 04/10/2020   CREATININE 0.89 03/26/2020   CREATININE 1.13 (H) 01/30/2020    Lab Results  Component Value Date   HGBA1C 5.4 04/10/2020       Component Value Date/Time   CHOL 100 02/03/2019 1413   CHOL 137 02/03/2013 0441   TRIG 121.0 02/03/2019 1413   TRIG 197 02/03/2013 0441   HDL 51.00 02/03/2019 1413   HDL 25 (L) 02/03/2013 0441   CHOLHDL 2 02/03/2019 1413   VLDL 24.2 02/03/2019 1413   VLDL 39 02/03/2013 0441   LDLCALC 25 02/03/2019 1413   LDLCALC 73 02/03/2013 0441      Clinical ASCVD: Yes  The ASCVD Risk score (Goff DC Jr., et al., 2013) failed to calculate for the following reasons:   The patient has a prior MI or stroke diagnosis    CHA2DS2-VASc Score = 6  This indicates a 9.7% annual risk of stroke. The patient's score is based upon:  BP Readings from Last 3 Encounters:  05/02/20 122/66  04/30/20 124/68  04/19/20 138/74    Assessment:   Allergies  Allergen Reactions  . Aspirin Hives and Other (See Comments)    Pt states  that she is unable to take because she had bleeding in her brain  . Other Other (See Comments)    Pt states that she is unable to take blood thinners because she had bleeding in her brain; Blood Thinners-per doctor at [facility]  . Penicillins Hives, Shortness Of Breath, Swelling and Other (See Comments)    Has patient had a PCN reaction causing immediate rash, facial/tongue/throat swelling, SOB or lightheadedness with hypotension: Yes Has patient had a PCN reaction causing severe rash involving mucus membranes or skin necrosis: No Has patient had a PCN reaction that required hospitalization No Has patient had a PCN reaction occurring within the last 10 years: No If all of the above answers are "NO", then may proceed with Cephalosporin use.    Medications Reviewed Today    Reviewed by [name], EMT (Nurse Tech) on 05/02/20 at [facility] List Status: <None>  Medication Order Taking? Sig Documenting Provider Last Dose Status Informant  acetaminophen (TYLENOL) 325 MG tablet [id number] Yes Take 325-650 mg by mouth every 6 (six) hours as needed for mild pain. Reported on 10/10/2015 [provider] Taking Active Self  albuterol (PROVENTIL) (2.5 MG/3ML) 0.083% nebulizer solution [id number] Yes Take 3 mLs (2.5 mg total) by nebulization every 6 (six) hours.  Patient taking differently: Take 2.5 mg by nebulization every 6 (six) hours as needed.    Loletha Grayer, MD  Taking Active Self  apixaban (ELIQUIS) 5 MG TABS tablet [id number] Yes Take 1 tablet (5 mg total) by mouth 2 (two) times daily. Larey Dresser, MD Taking Active   cholestyramine Lucrezia Starch) 4 g packet [id number] Yes Take by mouth. [provider] Taking Active   digoxin (LANOXIN) 0.125 MG tablet [id number] Yes Take 1 tablet by mouth once daily Larey Dresser, MD Taking Active   gabapentin (NEURONTIN) 100 MG capsule [id number] Yes TAKE 2 CAPSULES BY MOUTH THREE TIMES DAILY [name], MD Taking Active   glucose  blood test strip [id number] Yes Use with True Metrix glucometer to check sugars BID [name], MD Taking Active   Lancets [name] [id number] Yes Use with True Metrix glucometer to check sugars BID [name], MD Taking Active   macitentan (OPSUMIT) 10 MG tablet [id number] Yes Take 1 tablet (10 mg total) by mouth daily. Larey Dresser, MD Taking Active   meclizine (ANTIVERT) 25 MG tablet [id number] Yes Take 1 tablet (25 mg total) by mouth 3 (three) times daily as needed for dizziness. [name], MD Taking Active   Menthol, Topical Analgesic, [facility] EX) [id number] Yes Apply 1 application topically as needed (for pain).  [provider] Taking Active Self  ondansetron (ZOFRAN) 8 MG tablet [id number] Yes Take 1 tablet (8 mg total) by mouth every 8 (eight) hours as needed for nausea or vomiting. Jackolyn Confer, MD Taking Active Self           Med Note Cyndra Numbers Aug 16, 2017  9:43 AM)    OXYGEN [id number] Yes Inhale 4 L into the lungs continuous. [provider] Taking Active Self  potassium chloride SA (KLOR-CON) 20 MEQ tablet [id number] Yes Take 1 tablet (20 mEq total) by mouth daily. [name], MD Taking Active   rosuvastatin (CRESTOR) 10 MG tablet [id number] Yes Take 1 tablet (10 mg total) by mouth daily. Larey Dresser, MD Taking Active   Selexipag (UPTRAVI) 1600 MCG TABS [id number] Yes Take 1,600 mcg by mouth 2 (two) times daily. [provider] Taking Active Self  sildenafil (REVATIO) 20 MG tablet [id number] Yes Take 1 tablet (20 mg total) by mouth 3 (three) times daily. Larey Dresser, MD Taking Active   silver sulfADIAZINE (SILVADENE) 1 % cream [id number] Yes Apply 1 application topically daily. [name], MD Taking Active   spironolactone (ALDACTONE) 25 MG tablet [id number] Yes Take 1 tablet (25 mg total) by mouth at bedtime. Please call for a office visit [phone] Larey Dresser, MD Taking Active   torsemide  [facility]) 20 MG tablet [id number] Yes Take 3 tablets (60 mg total) by mouth 2 (two) times daily. Larey Dresser, MD Taking Active           Patient Care Plan: Pharmacy - Prevention of Disease Progression    Problem Identified: Pharmacy - Prevention of Disease Progression     Long-Range Goal: Disease Progression Prevented or Minimized   This Visit's Progress: On track  Priority: High  Note:   Current Barriers:  . Unable to maintain control of glycemic control . Polypharmacy, complex patient with multiple comorbidities   Pharmacist Clinical Goal(s):  [name] Over the next 90 days, patient will work with PharmD and provider towards optimized glycemic control . Over the next 90 days, patient will work with PharmD and provider towards optimized medication management  Review and Interventions  Medication Management . Comprehensive medication review performed; medication list updated in  electronic medical record . Encouraged continued collaborate w/ [name], EMT for pill box fills and adherence visits  Diabetes: . Controlled per last A1c, though most recent glucose readings elevated. Patient reports that since improvement of diarrhea, she has had an improvement in her appetite; current treatment: none;  . Current glucose readings: fasting glucose: 160-180s, post prandial glucose: 200s . Denies hyperglycemic symptoms of polydipsia, polyuria/increased nocturia. Just reports increased appetite.  . Does report that she has a home supply of [name] from previous application for patient assistance . Peripheral neuropathy: gabapentin 200 mg TID . Collaborated with PCP.  Start [name] 8.89 mg weekly. Patient has supply at home. Will monitor sugars and appetite  Hypertension, Heart Failure, Pulmonary Hypertension . Managed by [facility], Cardiology; Current treatment: digoxin 0.125 mg daily, Opsumit 10 mg daily, potassium 20 mEq daily, Uptravi 1600 mcg BID, sildenafil  citrate 20 mg TID, spironolactone 25 mg daily, torsemide 60 mg BID;  . Home weights: most recent 190 lbs. Reports she has not been weighing daily because "I hate to see the weight gain" . Counseled on daily weights and importance of knowing if weight gain >3 lbs in 1 day, >5 lbs in 1 week   Hyperlipidemia: . Controlled; current treatment: rosuvastatin 10 mg daily;  . Recommended continue current regimen  Atrial Fibrillation: . Controlled; anticoagulant treatment: Eliquis 5 mg BID . Recommended continue current regimen  Chronic Diarrhea: . Improved, current treatment cholestyramine 4 g daily.  . Meclizine PRN dizziness/nausea, ondansetron PRN nausea/vomiting.    Inter-disciplinary care team collaboration (see longitudinal plan of care)  Patient Goals/Self-Care Activities . Over the next 90 days, patient will:  - take medications as prescribed check blood glucose BID, document, and provide at future appointments weight daily, and contact provider if weight gain of > 3 lbs in 1 day, >5 lbs in 1 week  Follow Up Plan: Telephone follow up appointment with care management team member scheduled for: ~ 3 weeks     [name], PharmD, [place], [place] Pharmacist [place] [phone]

## 2020-05-04 ENCOUNTER — Ambulatory Visit: Payer: Medicare HMO | Admitting: Nurse Practitioner

## 2020-05-04 ENCOUNTER — Encounter: Payer: Self-pay | Admitting: Nurse Practitioner

## 2020-05-04 ENCOUNTER — Other Ambulatory Visit: Payer: Self-pay

## 2020-05-04 VITALS — Ht 60.0 in | Wt 189.0 lb

## 2020-05-04 DIAGNOSIS — I5032 Chronic diastolic (congestive) heart failure: Secondary | ICD-10-CM

## 2020-05-04 DIAGNOSIS — I428 Other cardiomyopathies: Secondary | ICD-10-CM

## 2020-05-04 DIAGNOSIS — I130 Hypertensive heart and chronic kidney disease with heart failure and stage 1 through stage 4 chronic kidney disease, or unspecified chronic kidney disease: Secondary | ICD-10-CM | POA: Diagnosis not present

## 2020-05-04 DIAGNOSIS — J849 Interstitial pulmonary disease, unspecified: Secondary | ICD-10-CM

## 2020-05-04 DIAGNOSIS — I4819 Other persistent atrial fibrillation: Secondary | ICD-10-CM

## 2020-05-04 DIAGNOSIS — I10 Essential (primary) hypertension: Secondary | ICD-10-CM | POA: Diagnosis not present

## 2020-05-04 DIAGNOSIS — E782 Mixed hyperlipidemia: Secondary | ICD-10-CM | POA: Diagnosis not present

## 2020-05-04 DIAGNOSIS — Z79899 Other long term (current) drug therapy: Secondary | ICD-10-CM | POA: Diagnosis not present

## 2020-05-04 NOTE — Patient Instructions (Signed)
Medication Instructions:  - Your physician has recommended you make the following change in your medication:   1) INCREASE torsemide 20 mg - take 3 tablets (60 mg) in the morning & 2 tablets (40 mg) in the afternoon  *If you need a refill on your cardiac medications before your next appointment, please call your pharmacy*   Lab Work: - Your physician recommends that you return for lab work in: 1 week (around 05/11/20)- BMP  - [place] entrance at [facility] - 1st desk on the right to check in, past the screening table - Lab hours: Monday- Friday (7:30 am- 5:30 pm)  If you have labs (blood work) drawn today and your tests are completely normal, you will receive your results only by: [name] MyChart Message (if you have MyChart) OR . A paper copy in the mail If you have any lab test that is abnormal or we need to change your treatment, we will call you to review the results.   Testing/Procedures: - none ordered   Follow-Up: At [facility], you and your health needs are our priority.  As part of our continuing mission to provide you with exceptional heart care, we have created designated Provider Care Teams.  These Care Teams include your primary Cardiologist (physician) and Advanced Practice Providers (APPs -  Physician Assistants and Nurse Practitioners) who all work together to provide you with the care you need, when you need it.  We recommend signing up for the patient portal called "MyChart".  Sign up information is provided on this After Visit Summary.  MyChart is used to connect with patients for Virtual Visits (Telemedicine).  Patients are able to view lab/test results, encounter notes, upcoming appointments, etc.  Non-urgent messages can be sent to your provider as well.   To learn more about what you can do with MyChart, go to [email or web address].    Your next appointment:   As scheduled    The format for your next appointment:   In Person  Provider:   Murray Hodgkins,  NP   Other Instructions n/a

## 2020-05-04 NOTE — Progress Notes (Signed)
Office Visit    Patient Name: Debra Saunders Date of Encounter: 05/04/2020  Primary Care Provider:  [name], MD Primary Cardiologist:  [name], MD  Chief Complaint    74 y/o ? 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, PAH, nonobs CAD (09/2017), persistent atrial fibrillation Eliquis, symptomatic bradycardia status post permanent pacemaker, frequent PVCs, chronic respiratory failure with hypoxia on home O2 secondary to COPD, interstitial lung disease, pulmonary hypertension, type 2 diabetes mellitus, hypertension, hyperlipidemia, obesity, traumatic subarachnoid hemorrhage in the setting of Xarelto, left-sided DVT status post IVC filter, chronic right-sided atypical chest pain, chronic left lower extremity lymphedema, prior stroke, sleep apnea, and cardiorenal syndrome, who presents for f/u of wt gain and presyncope.  Past Medical History    Past Medical History:  Diagnosis Date  . Arthritis   . Atrial fibrillation, persistent ([place])    a. s/p TEE-guided DCCV on 08/20/2017  . Cardiorenal syndrome    a. 09/2017 Creat rose to 2.7 w/ diuresis-->HD x 2.  . Chest pain    a. 01/2012 Cath: nonobs dzs;  c. 04/2014 MV: No ischemia.  . Chronic back pain   . Chronic combined systolic (congestive) and diastolic (congestive) heart failure ([place])    a. Dx 2005 @ [facility];  b. 02/2014 Echo: EF 55-60%; c. 07/2017: EF 30-35%, diffuse HK, trivial AI, severe MR, moderate TR; d. 09/2017 Echo: EF 40-45%; e. 02/2018 Echo: EF 55-60%, Gr1 DD; f. 08/2019 Echo: EF 60-65%, Gr2 DD, Nl RV fxn, RVSP 49.11mmHg. Mod-Sev MR. Mild to mod TR. Mod PR. RA ~ 55mmHg.  [name] Chronic respiratory failure (HCC)    a. on home O2  . COPD (chronic obstructive pulmonary disease) ([place])    a. Home O2 - 3lpm  . Gastritis   . History of intracranial hemorrhage    a. 6/14 described by neurosurgery as "small frontal posttraumatic SAH " - pt was on xarelto @ the time.  .  Hyperlipidemia   . Hypertension   . Interstitial lung disease ([place])   . Lipoma of back   . Lymphedema    a. Chronic LLE edema.  [name] NICM (nonischemic cardiomyopathy) ([place])    a. 09/2017 Echo: EF 40-45%; b. 09/2017 Cath: minor, insignificant CAD; c. 02/2018 Echo: EF 55-60%, Gr1 DD; d. 08/2019 Echo: EF 60-65%, Gr2 DD.  [name] Obesity   . PAH (pulmonary artery hypertension) ([place])    a. 09/2017 Echo: PASP 93mmHg; b. 09/2017 RHC: RA 23, RV 84/13, PA 84/29, PCWP 20, CO 3.89, CI 1.89. LVEDP 18.  . Sepsis with metabolic encephalopathy ([place])   . Sleep apnea   . Streptococcal bacteremia   . Stroke ([place])   . Subarachnoid hemorrhage ([place]) 01/19/2013  . Symptomatic bradycardia    a. s/p BSX PPM.  . Thyroid nodule   . Type II diabetes mellitus ([place])   . Urinary incontinence    Past Surgical History:  Procedure Laterality Date  . ABDOMINAL HYSTERECTOMY  1969  . BREAST BIOPSY Right 10/28/2019   lymph node bx, [place] Bx, pending path   . CARDIAC CATHETERIZATION  2013   @ [place]: No obstructive CAD: Only 20% ostial left circumflex.  [name] CARDIOVERSION N/A 10/12/2017   Procedure: CARDIOVERSION;  Surgeon: Minna Merritts, MD;  Location: [facility] ORS;  Service: Cardiovascular;  Laterality: N/A;  . CHOLECYSTECTOMY    . COLONOSCOPY WITH PROPOFOL N/A 09/10/2018   Procedure: COLONOSCOPY WITH PROPOFOL;  Surgeon: Manya Silvas, MD;  Location:  [facility];  Service: Endoscopy;  Laterality: N/A;  . CYST REMOVAL TRUNK  2002   BACK  . DIALYSIS/PERMA CATHETER INSERTION N/A 10/06/2017   Procedure: DIALYSIS/PERMA CATHETER INSERTION;  Surgeon: Katha Cabal, MD;  Location: [place];  Service: Cardiovascular;  Laterality: N/A;  . INSERT / REPLACE / REMOVE PACEMAKER    . lipoma removal  2014   back  . PACEMAKER INSERTION  [address] [employer] dual chamber pacemaker implanted by Dr Caryl Comes for symptomatic bradycardia  . PERMANENT PACEMAKER INSERTION N/A 01/09/2014   Procedure: PERMANENT PACEMAKER INSERTION;   Surgeon: Deboraha Sprang, MD;  Location: [facility] CATH LAB;  Service: Cardiovascular;  Laterality: N/A;  . RIGHT HEART CATH N/A 04/19/2018   Procedure: RIGHT HEART CATH;  Surgeon: Larey Dresser, MD;  Location: [place] [facility];  Service: Cardiovascular;  Laterality: N/A;  . RIGHT/LEFT HEART CATH AND CORONARY ANGIOGRAPHY N/A 10/19/2017   Procedure: RIGHT/LEFT HEART CATH AND CORONARY ANGIOGRAPHY;  Surgeon: Wellington Hampshire, MD;  Location: [place] [facility];  Service: Cardiovascular;  Laterality: N/A;  . TEE WITHOUT CARDIOVERSION N/A 08/20/2017   Procedure: TRANSESOPHAGEAL ECHOCARDIOGRAM (TEE) & Direct current cardioversion;  Surgeon: Minna Merritts, MD;  Location: [facility] ORS;  Service: Cardiovascular;  Laterality: N/A;  . TRIGGER FINGER RELEASE      Allergies  Allergies  Allergen Reactions  . Aspirin Hives and Other (See Comments)    Pt states that she is unable to take because she had bleeding in her brain  . Other Other (See Comments)    Pt states that she is unable to take blood thinners because she had bleeding in her brain; Blood Thinners-per doctor at [facility]  . Penicillins Hives, Shortness Of Breath, Swelling and Other (See Comments)    Has patient had a PCN reaction causing immediate rash, facial/tongue/throat swelling, SOB or lightheadedness with hypotension: Yes Has patient had a PCN reaction causing severe rash involving mucus membranes or skin necrosis: No Has patient had a PCN reaction that required hospitalization No Has patient had a PCN reaction occurring within the last 10 years: No If all of the above answers are "NO", then may proceed with Cephalosporin use.    History of Present Illness    74 y/o ? 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, PAH, nonobs CAD (09/2017), persistent atrial fibrillation Eliquis, symptomatic bradycardia status post permanent pacemaker, frequent PVCs, chronic respiratory failure with hypoxia on  home O2 secondary to COPD, interstitial lung disease (amiodarone previously discontinued out of concern for toxicity), pulmonary hypertension, type 2 diabetes mellitus, hypertension, hyperlipidemia, obesity, traumatic subarachnoid hemorrhage in the setting of Xarelto, left-sided DVT status post IVC filter, chronic right-sided atypical chest pain, chronic left lower extremity lymphedema, prior stroke, sleep apnea, and cardiorenal syndrome.  She previously underwent diagnostic catheterization August 2013 with nonobstructive disease followed by negative stress testing in November 2015.  She has a long history of diastolic heart failure and at one point in February 2019, EF was down to 30-35% but this is subsequently improved.  She was previously followed by the heart failure team in [facility] and last underwent right heart catheterization October 2019 showing moderate pulmonary hypertension but overall optimized filling pressures.  She has since been maintained on uptravi, opsumit, and revatio.  Most recent echocardiogram in March 2021 showed an EF of 36-46%, grade 2 diastolic dysfunction, moderate to severe mitral vegetation, mild to moderate TR, and an RVSP of  49.7 mmHg.  She has has chronic lightheadedness which is worsened by position changes and sometimes associated with near syncope.  Previous pacemaker device interrogations have not revealed any significant arrhythmias.  At her most recent clinic visit in August, she reported ongoing weight loss with poor appetite, and diarrhea.  Torsemide was reduced to 40 mg twice daily.  She was being evaluated by GI related to weight loss and diarrhea but it was felt that if symptoms did not improve, we would consider stopping digoxin.  At her October primary care visit, she reported an episode of presyncope that occurred while sitting and lasted about 10 minutes.  Her blood sugar was normal..  She was noted to be irregular on exam and an ECG was performed and showed AV  paced rhythm with PACs.  Over the past month, she has noted significant improvement in appetite and so she is always hungry and eating better.  She has also had resolution of diarrhea though stools remain soft.  She still has 5-6 bowel movements a day, which is down from 10+ watery bowel movements previously.  She is chronic dyspnea on exertion, which she feels is stable.  She has chronic left greater than right lower extremity swelling, which she also feels is stable.  Her weight is up 10 pounds since July, and she notes that most of that has come just within the past 2 weeks.  She has been on torsemide 40 mg twice daily since her July cardiology visit.  She denies chest pain, palpitations, PND, orthopnea, syncope, or early satiety.  Home Medications    Prior to Admission medications   Medication Sig Start Date End Date Taking? Authorizing Provider  acetaminophen (TYLENOL) 325 MG tablet Take 325-650 mg by mouth every 6 (six) hours as needed for mild pain. Reported on 10/10/2015    [provider]  albuterol (PROVENTIL) (2.5 MG/3ML) 0.083% nebulizer solution Take 3 mLs (2.5 mg total) by nebulization every 6 (six) hours. Patient taking differently: Take 2.5 mg by nebulization every 6 (six) hours as needed.  08/28/17   Loletha Grayer, MD  apixaban (ELIQUIS) 5 MG TABS tablet Take 1 tablet (5 mg total) by mouth 2 (two) times daily. 02/16/20   Larey Dresser, MD  cholestyramine Lucrezia Starch) 4 g packet Take 4 g by mouth daily.  04/04/20 04/04/21  [provider]  digoxin (LANOXIN) 0.125 MG tablet Take 1 tablet by mouth once daily 08/25/19   Larey Dresser, MD  Dulaglutide ([name]) 7.85 YI/5.0YD SOPN Inject 0.75 mg into the skin once a week. 05/03/20   [name], MD  gabapentin (NEURONTIN) 100 MG capsule TAKE 2 CAPSULES BY MOUTH THREE TIMES DAILY 04/19/20   [name], MD  glucose blood test strip Use with True Metrix glucometer to check sugars BID 11/14/19   [name], MD  Lancets MISC Use with True Metrix glucometer to check sugars BID 11/14/19   [name], MD  macitentan (OPSUMIT) 10 MG tablet Take 1 tablet (10 mg total) by mouth daily. 11/23/19   Larey Dresser, MD  meclizine (ANTIVERT) 25 MG tablet Take 1 tablet (25 mg total) by mouth 3 (three) times daily as needed for dizziness. 11/04/19   [name], MD  Menthol, Topical Analgesic, (BIOFREEZE EX) Apply 1 application topically as needed (for pain).     [provider]  ondansetron (ZOFRAN) 8 MG tablet Take 1 tablet (8 mg total) by mouth every 8 (eight) hours as needed  for nausea or vomiting. 09/25/14   Jackolyn Confer, MD  OXYGEN Inhale 4 L into the lungs continuous.    [provider]  potassium chloride SA (KLOR-CON) 20 MEQ tablet Take 1 tablet (20 mEq total) by mouth daily. 12/22/19   [name], MD  rosuvastatin (CRESTOR) 10 MG tablet Take 1 tablet (10 mg total) by mouth daily. 03/14/20   Larey Dresser, MD  Selexipag (UPTRAVI) 1600 MCG TABS Take 1,600 mcg by mouth 2 (two) times daily.    [provider]  sildenafil (REVATIO) 20 MG tablet Take 1 tablet (20 mg total) by mouth 3 (three) times daily. 07/11/19   Larey Dresser, MD  silver sulfADIAZINE (SILVADENE) 1 % cream Apply 1 application topically daily. 04/20/20   [name], MD  spironolactone (ALDACTONE) 25 MG tablet Take 1 tablet (25 mg total) by mouth at bedtime. Please call for a office visit [phone] 04/19/20   Larey Dresser, MD  torsemide (DEMADEX) 20 MG tablet Take 3 tablets (60 mg total) by mouth 2 (two) times daily. 04/19/20   Larey Dresser, MD    Review of Systems    Chronic, stable dyspnea exertion.  Chronic, stable left greater than right lower extremity swelling.  Episode of presyncope in mid October.  She has chronic lightheadedness with position changes which has been stable.  Diarrhea has resolved but she still has 5-6 soft stools a day.  She denies chest  pain, palpitations, PND, orthopnea, syncope, or early satiety.  All other systems reviewed and are otherwise negative except as noted above.  Physical Exam    VS:  Ht 5' (1.524 m)   Wt 189 lb (85.7 kg)   SpO2 98% Comment: 4 Liters of Oxygen  BMI 36.91 kg/m  , BMI Body mass index is 36.91 kg/m.  Orthostatic VS for the past 24 hrs:  BP- Lying Pulse- Lying BP- Sitting Pulse- Sitting BP- Standing at 0 minutes Pulse- Standing at 0 minutes  05/04/20 1408 151/75 60 147/80 60 149/81 61    GEN: Well nourished, well developed, in no acute distress. HEENT: normal. Neck: Supple, obese, difficult to gauge JVP.  No carotid bruits, or masses. Cardiac: Regular rhythm with frequent atrial ectopy.  2/6 systolic murmur loudest along the left sternal border.  No murmurs, rubs, or gallops. No clubbing, cyanosis.  1+ left lower extremity edema.  No edema on the right.  Radials 2+, PT 1+ and equal bilaterally.  Respiratory:  Respirations regular and unlabored, clear to auscultation bilaterally. GI: Obese, soft, nontender, nondistended, BS + x 4. MS: no deformity or atrophy. Skin: warm and dry, no rash. Neuro:  Strength and sensation are intact. Psych: Normal affect.  Accessory Clinical Findings    ECG personally reviewed by me today -AV paced- no acute changes.  Lab Results  Component Value Date   WBC 5.6 04/10/2020   HGB 10.1 (L) 04/10/2020   HCT 31.8 (L) 04/10/2020   MCV 87.9 04/10/2020   PLT 187.0 04/10/2020   Lab Results  Component Value Date   CREATININE 1.08 04/10/2020   BUN 30 (H) 04/10/2020   NA 139 04/10/2020   K 3.8 04/10/2020   CL 102 04/10/2020   CO2 31 04/10/2020   Lab Results  Component Value Date   ALT 7 04/10/2020   AST 13 04/10/2020   ALKPHOS 163 (H) 04/20/2020   BILITOT 0.4 04/10/2020   Lab Results  Component Value Date   CHOL 100 02/03/2019   HDL  51.00 02/03/2019   LDLCALC 25 02/03/2019   TRIG 121.0 02/03/2019   CHOLHDL 2 02/03/2019    Lab Results  Component  Value Date   HGBA1C 5.4 04/10/2020    Assessment & Plan    1.  Chronic heart failure with preserved ejection improved ejection fraction/nonischemic cardiomyopathy: EF previously as low as 30 to 35% in February 2019 with improvement to 60-65% with grade 2 diastolic dysfunction by echo in March 2021.  She feels as though chronic dyspnea and left leg edema have been stable though her weight is up 10 pounds since July.  She notes that most of this came just within the past 2 weeks.  She has noted significant improvement in diarrhea and also appetite, stating she is always hungry now and eating quite a bit.  ReDS Vest reading is elevated at 40%.  I have asked her to increase her torsemide to 60 mg in the morning and 40 mg in the afternoon (previously on 40 twice daily).  Follow-up basic metabolic panel next week.  Continue current dose of spironolactone.  2.  Pulmonary arterial hypertension: Stable, chronic dyspnea requiring home O2.  She remains on Opsumit, Rosalio, and Uptravi.  Previously followed in advanced heart failure clinic in [place] and we will get her routed back to them early next year.  3.  Presyncope/chronic lightheadedness with orthostasis: Patient had a presyncopal spell at rest in mid October that lasted approximate 10 minutes and resolve spontaneously.  She has not had any recurrent symptoms of that severity.  She does have chronic lightheadedness with position changes and also chronic, mild orthostatic drops in blood pressure (151 down to 138 today).  I recommended that if she has any recurrent presyncope, she should submit a remote device transmission otherwise, await formal device check in early December.  4.  Persistent atrial fibrillation: Remains in sinus rhythm/AV paced today.  Frequent PACs on examination.  Continue digoxin and Eliquis therapy.  As she is coming back for labs next week, I will add a digoxin level since it has been over a year.  5.  Chronic respiratory  failure/COPD/interstitial lung disease: Amiodarone previously discontinued.  No active wheezing.  She remains on oxygen therapy and is followed by pulmonology.  6.  Hyperlipidemia: She remains on statin therapy.  LFTs normal in October.  Lipids in August 2020 showed an LDL of 25.  7.  History of cardiorenal syndrome: Follow-up basic metabolic panel next week given slight titration of torsemide.  8.  Type 2 diabetes mellitus: A1c 5.4 in October on [name].  Can consider addition of Jardiance in the setting of HFpEF.  9.  Disposition: Follow-up basic metabolic panel in 1 week.  She has device clinic check in December and cardiology follow-up in late December.  Murray Hodgkins, NP 05/04/2020, 3:33 PM

## 2020-05-14 ENCOUNTER — Other Ambulatory Visit: Payer: Self-pay

## 2020-05-14 NOTE — Patient Outreach (Signed)
[facility]) Care Management  05/14/2020  Debra Saunders [birth year] [phone]   Telephone call to patient for disease management follow up.   No answer.  HIPAA compliant voice message left.    Plan: If no return call, RN CM will attempt patient again in the month of January.    [name], RN, MSN [place] Management Care Management Coordinator Direct Line [phone] Cell [phone] Toll Free: [phone]  Fax: [phone]

## 2020-05-15 ENCOUNTER — Other Ambulatory Visit: Payer: Self-pay | Admitting: Family Medicine

## 2020-05-15 ENCOUNTER — Other Ambulatory Visit (HOSPITAL_COMMUNITY): Payer: Self-pay | Admitting: Cardiology

## 2020-05-16 ENCOUNTER — Telehealth: Payer: Self-pay | Admitting: Family Medicine

## 2020-05-16 NOTE — Telephone Encounter (Signed)
Please let the patient know I heard back from her GI physician regarding her elevated alkaline phosphatase.  They said we could recheck in several months or we can start with a basic work-up with some lab work and an ultrasound of her liver.  I think it would be reasonable to proceed with a work-up at this time.  Though if she would prefer to recheck in several months that is reasonable as well.  If it is still elevated at her follow-up in January we would certainly proceed with the work-up.

## 2020-05-16 NOTE — Telephone Encounter (Signed)
-----  Message from Lesly Rubenstein, MD sent at 05/01/2020  3:56 PM EST ----- Regarding: RE: elevated alk phos Lyna Poser, you could do a couple of things in regards to her. You could recheck them in a couple of months or you could do a basic work-up with hepatitis labs, AMA, and U/S with dopplers. The U/S with dopplers will likely show a dilated IVC due to her elevated right heart pressures. I think she probably has hepatic congestion from her pulmonary hypertension. Pulmonary hypertension scares me cause they die quickly. Just for the future I think her diarrhea will likely recur because I think they are from her [place] medicines. Let me know if you have any other questions. Thanks,Trevor ----- Message ----- From: [name], MD Sent: 04/30/2020   3:59 PM EST To: Lesly Rubenstein, MD Subject: elevated alk phos                              Marena Chancy,   I wanted to see if you could help me with some guidance on this patient. She saw you recently for diarrhea which seems to be improved. Her alk phos has been mildly elevated over the past month. I had it fractionated and it was 59% liver, 37% bone, and 4% intestine. Her other LFTs are in the normal range. Would this be something you would work up with the fractionation not pointing to a specific source? Thanks.   Randall Hiss

## 2020-05-16 NOTE — Telephone Encounter (Signed)
I called and spoke with the patient's daughter and her and  informed her about the elevated alkaline phosphates and she would like to monitor it and follow up at her appointment with provider in January.  Delmar Arriaga,cma

## 2020-05-19 ENCOUNTER — Other Ambulatory Visit: Payer: Self-pay | Admitting: Family Medicine

## 2020-05-23 ENCOUNTER — Telehealth: Payer: Self-pay | Admitting: *Deleted

## 2020-05-23 NOTE — Telephone Encounter (Signed)
Spoke with patient's daughter [name]. Informed daughter that patient's insurance requires a prior authorization for the retacrit, but does not require an authorization for venofer. Daughter is unable to bring her mother for any lab apts any sooner than scheduled apts.  She will call me back if anything should change. They are willing to come back on another day if needed next week for the injection.   Patient is on the schedule for Monday 05/28/2020.

## 2020-05-25 ENCOUNTER — Other Ambulatory Visit: Payer: Self-pay

## 2020-05-28 ENCOUNTER — Inpatient Hospital Stay: Payer: Medicare HMO

## 2020-05-28 ENCOUNTER — Inpatient Hospital Stay: Payer: Medicare HMO | Admitting: Internal Medicine

## 2020-05-29 ENCOUNTER — Ambulatory Visit (INDEPENDENT_AMBULATORY_CARE_PROVIDER_SITE_OTHER): Payer: Medicare HMO

## 2020-05-29 ENCOUNTER — Other Ambulatory Visit (HOSPITAL_COMMUNITY): Payer: Self-pay | Admitting: *Deleted

## 2020-05-29 ENCOUNTER — Other Ambulatory Visit (HOSPITAL_COMMUNITY): Payer: Self-pay

## 2020-05-29 ENCOUNTER — Encounter (HOSPITAL_COMMUNITY): Payer: Self-pay

## 2020-05-29 DIAGNOSIS — I480 Paroxysmal atrial fibrillation: Secondary | ICD-10-CM | POA: Diagnosis not present

## 2020-05-29 DIAGNOSIS — E785 Hyperlipidemia, unspecified: Secondary | ICD-10-CM

## 2020-05-29 LAB — CUP PACEART REMOTE DEVICE CHECK
Battery Remaining Longevity: 78 mo
Battery Remaining Percentage: 83 %
Brady Statistic RA Percent Paced: 71 %
Brady Statistic RV Percent Paced: 99 %
Date Time Interrogation Session: 20211207033300
Implantable Lead Implant Date: 20150720
Implantable Lead Implant Date: 20150720
Implantable Lead Location: 753859
Implantable Lead Location: 753860
Implantable Lead Model: 5076
Implantable Lead Model: 5076
Implantable Pulse Generator Implant Date: 20150720
Lead Channel Impedance Value: 363 Ohm
Lead Channel Impedance Value: 507 Ohm
Lead Channel Pacing Threshold Amplitude: 0.8 V
Lead Channel Pacing Threshold Pulse Width: 0.4 ms
Lead Channel Setting Pacing Amplitude: 2 V
Lead Channel Setting Pacing Amplitude: 2.4 V
Lead Channel Setting Pacing Pulse Width: 0.4 ms
Lead Channel Setting Sensing Sensitivity: 2.5 mV
Pulse Gen Serial Number: 390330

## 2020-05-29 MED ORDER — ROSUVASTATIN CALCIUM 10 MG PO TABS: 10.0000 mg | ORAL_TABLET | Freq: Every day | ORAL | 3 refills | Status: DC

## 2020-05-29 NOTE — Progress Notes (Signed)
Today had a home visit with Debra Saunders.  She states she lost a couple of lbs.  She states the new medication still helping her diarrhea.  She states hungry all the time and her sugars are running up a little.  She states around 180.  She states took her trulicity.  She states trying to watch what she eats, but not really.  She states she has been eating some salty foods but limit the amount.  She ate eggs and corned beef hash this am but only ate a spoon full of it.  She has been eating more.  She is weighing daily.  She states she is urinating well.  She has all her medications, filled her med boxes for 2 weeks.   She denies any problems today.  She denies any chest pain or more shortness of breath.  Called in refills for rousevastatin and digoxin for her.  She has no more than her normal lower edema.  Will continue to visit for heart fialure, diet and medication compliance.    [place] [phone]

## 2020-06-04 ENCOUNTER — Ambulatory Visit: Payer: Medicare HMO | Admitting: Pharmacist

## 2020-06-04 DIAGNOSIS — E1122 Type 2 diabetes mellitus with diabetic chronic kidney disease: Secondary | ICD-10-CM

## 2020-06-04 DIAGNOSIS — I272 Pulmonary hypertension, unspecified: Secondary | ICD-10-CM

## 2020-06-04 DIAGNOSIS — I5022 Chronic systolic (congestive) heart failure: Secondary | ICD-10-CM

## 2020-06-04 NOTE — Chronic Care Management (AMB) (Signed)
Chronic Care Management   Pharmacy Note  06/04/2020 Name: Debra Saunders MRN: [id number] DOB: 10/14/1945   Subjective:  Debra Saunders is a 74 y.o. year old female who is a primary care patient of [name], [name], MD. The CCM team was consulted for assistance with chronic disease management and care coordination needs.    Engaged with patient by telephone for follow up visit in response to provider referral for pharmacy case management and/or care coordination services.   Consent to Services:  Ms. Reasor was given information about Chronic Care Management services, agreed to services, and gave verbal consent prior to initiation of services on 02/17/2019. Please see initial visit note for detailed documentation.   SDOH (Social Determinants of Health) assessments and interventions performed:  SDOH Interventions   Flowsheet Row Most Recent Value  SDOH Interventions   SDOH Interventions for the Following Domains Stress, Financial Strain  Financial Strain Interventions Other (Comment)  [referral for Care Guide for Depends assistance]       Objective:  Lab Results  Component Value Date   CREATININE 1.08 04/10/2020   CREATININE 0.89 03/26/2020   CREATININE 1.13 (H) 01/30/2020    Lab Results  Component Value Date   HGBA1C 5.4 04/10/2020       Component Value Date/Time   CHOL 100 02/03/2019 1413   CHOL 137 02/03/2013 0441   TRIG 121.0 02/03/2019 1413   TRIG 197 02/03/2013 0441   HDL 51.00 02/03/2019 1413   HDL 25 (L) 02/03/2013 0441   CHOLHDL 2 02/03/2019 1413   VLDL 24.2 02/03/2019 1413   VLDL 39 02/03/2013 0441   LDLCALC 25 02/03/2019 1413   LDLCALC 73 02/03/2013 0441     BP Readings from Last 3 Encounters:  05/29/20 138/76  05/02/20 122/66  04/30/20 124/68    Assessment/Interventions: Review of patient past medical history, allergies, medications, health status, including review of consultants reports, laboratory and other test data, was performed as part  of comprehensive evaluation and provision of chronic care management services.   Allergies  Allergen Reactions  . Aspirin Hives and Other (See Comments)    Pt states that she is unable to take because she had bleeding in her brain  . Other Other (See Comments)    Pt states that she is unable to take blood thinners because she had bleeding in her brain; Blood Thinners-per doctor at [facility]  . Penicillins Hives, Shortness Of Breath, Swelling and Other (See Comments)    Has patient had a PCN reaction causing immediate rash, facial/tongue/throat swelling, SOB or lightheadedness with hypotension: Yes Has patient had a PCN reaction causing severe rash involving mucus membranes or skin necrosis: No Has patient had a PCN reaction that required hospitalization No Has patient had a PCN reaction occurring within the last 10 years: No If all of the above answers are "NO", then may proceed with Cephalosporin use.    Medications Reviewed Today    Reviewed by De Hollingshead, RPH-CPP (Pharmacist) on 06/04/20 at 34  Med List Status: <None>  Medication Order Taking? Sig Documenting Provider Last Dose Status Informant  acetaminophen (TYLENOL) 325 MG tablet [id number] Yes Take 325-650 mg by mouth every 6 (six) hours as needed for mild pain. Reported on 10/10/2015 [provider] Taking Active Self  albuterol (PROVENTIL) (2.5 MG/3ML) 0.083% nebulizer solution [id number] Yes Take 3 mLs (2.5 mg total) by nebulization every 6 (six) hours.  Patient taking differently: Take 2.5 mg by nebulization every 6 (six) hours as needed.  Loletha Grayer, MD Taking Active Self  cholestyramine (QUESTRAN) 4 g packet [id number] Yes Take 4 g by mouth daily.  [provider] Taking Active   digoxin (LANOXIN) 0.125 MG tablet [id number] Yes Take 1 tablet by mouth once daily Larey Dresser, MD Taking Active   ELIQUIS 5 MG TABS tablet [id number] Yes Take 1 tablet by mouth twice daily Larey Dresser, MD Taking  Active   gabapentin (NEURONTIN) 100 MG capsule [id number] Yes TAKE 2 CAPSULES BY MOUTH THREE TIMES DAILY [name], MD Taking Active   glucose blood test strip [id number] Yes Use with True Metrix glucometer to check sugars BID [name], MD Taking Active   Lancets MISC [id number] Yes Use with True Metrix glucometer to check sugars BID [name], MD Taking Active   macitentan (OPSUMIT) 10 MG tablet [id number] Yes Take 1 tablet (10 mg total) by mouth daily. Larey Dresser, MD Taking Active   meclizine (ANTIVERT) 25 MG tablet [id number] No Take 1 tablet (25 mg total) by mouth 3 (three) times daily as needed for dizziness.  Patient not taking: Reported on 06/04/2020   [name], MD Not Taking Active   Menthol, Topical Analgesic, [facility] EX) [id number] Yes Apply 1 application topically as needed (for pain).  [provider] Taking Active Self  ondansetron (ZOFRAN) 8 MG tablet [id number] No Take 1 tablet (8 mg total) by mouth every 8 (eight) hours as needed for nausea or vomiting.  Patient not taking: No sig reported   Jackolyn Confer, MD Not Taking Active Self           Med Note Cyndra Numbers Aug 16, 2017  9:43 AM)    OXYGEN [id number] Yes Inhale 4 L into the lungs continuous. [provider] Taking Active Self  potassium chloride SA (KLOR-CON) 20 MEQ tablet [id number] Yes Take 1 tablet (20 mEq total) by mouth daily. [name], MD Taking Active   rosuvastatin (CRESTOR) 10 MG tablet [id number] Yes Take 1 tablet (10 mg total) by mouth daily. Larey Dresser, MD Taking Active   Selexipag (UPTRAVI) 1600 MCG TABS [id number] Yes Take 1,600 mcg by mouth 2 (two) times daily. [provider] Taking Active Self  sildenafil (REVATIO) 20 MG tablet [id number] Yes Take 1 tablet (20 mg total) by mouth 3 (three) times daily. Larey Dresser, MD Taking Active   spironolactone (ALDACTONE) 25 MG tablet [id number] Yes Take 1 tablet (25 mg total)  by mouth at bedtime. Please call for a office visit [phone] Larey Dresser, MD Taking Active   torsemide [facility]) 20 MG tablet [id number] Yes Take 3 tablets (60 mg) by mouth in morning & 2 tablets (40 mg) by mouth in the afternoon [provider] Taking Active           Patient Active Problem List   Diagnosis Date Noted  . Burn 04/30/2020  . Obesity (BMI 30-39.9) 04/30/2020  . Elevated alkaline phosphatase level 04/30/2020  . Chronic diarrhea 11/04/2019  . Dysuria 09/29/2019  . Abnormal mammogram 09/29/2019  . Breast pain, right 08/08/2019  . Bilateral ovarian cysts 08/08/2019  . B12 deficiency 08/02/2019  . Iron deficiency 08/01/2019  . Dizziness 07/06/2019  . Shortness of breath   . Atrial fibrillation ([place]) 09/22/2017  . Acute bilateral low back pain without sciatica 01/28/2017  . Pain in limb 07/22/2016  . Neuropathy 06/09/2016  . Pain in the chest 03/10/2016  . Chronic deep vein thrombosis (DVT) (H[facility])  03/03/2016  . Chronic back pain 10/19/2015  . Goiter 04/25/2015  . Cervical disc disorder with radiculopathy of cervical region 03/30/2015  . Elevated LFTs 06/29/2014  . 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 Abnormality of gait 01/13/2014  . Sinoatrial node dysfunction ([place]) 01/09/2014  . Allergic rhinitis 10/26/2013  . Lymphedema 08/03/2013  . Constipation 04/12/2013  . Musculoskeletal chest pain 01/19/2013  . Fatigue 09/27/2012  . Anemia 04/28/2012  . Chronic respiratory failure with hypoxia ([place]) 04/26/2012  . Diabetes ([place]) 03/30/2012  . Pulmonary hypertension, unspecified ([place]) 03/30/2012  . Hyperlipidemia 03/30/2012  . Hypothyroidism 03/30/2012  . Chronic systolic heart failure ([place]) 01/22/2012  . Hypertension     Medication Assistance: None required. Patient affirms current coverage meets needs.   Patient Care Plan: Medication Management    Problem Identified: T2DM, HFpEF, pulmonary HTN, diarrhea     Long-Range Goal: Disease  Progression Prevention   This Visit's Progress: On track  Recent Progress: On track  Priority: High  Note:   Current Barriers:  . Unable to maintain control of glycemic control . Polypharmacy, complex patient with multiple comorbidities   Pharmacist Clinical Goal(s):  [name] Over the next 90 days, patient will work with PharmD and provider towards optimized glycemic control . Over the next 90 days, patient will work with PharmD and provider towards optimized medication management  Interventions: . Inter-disciplinary care team collaboration (see longitudinal plan of care) . Comprehensive medication review performed; medication list updated in electronic medical record  Diabetes: . Controlled. Notes that she took 2 doses of [name] but experienced more stomach pain w/ injection and more nausea. Does not want to take it anymore.  . Current glucose readings: fasting and pre meal: 120-150 . Peripheral neuropathy: gabapentin 200 mg TID . Agree with decision to stop therapy and monitor. Encouraged to continue to check glucose periodically. If further glycemic benefit needed in the future, could consider SGLT2  Hypertension, Heart Failure, Pulmonary Hypertension . Managed by [facility], Cardiology; Current treatment: digoxin 0.125 mg daily, Opsumit 10 mg daily, potassium 20 mEq daily, Uptravi 1600 mcg BID, sildenafil citrate 20 mg TID, spironolactone 25 mg daily, torsemide 60 mg QAM, 40 mg QPM  . Home weights: this morning 190 lbs. Notes that yesterday she was 187 lbs. Denies SOB. I asked why she didn't call cardiology; she is afraid they will put her back on high doses of furosemide. Reviewed hx, she remembers that metolazone did not provide benefit, but she denies ever being told to take 30 minutes before dose of diuretic.  [name] Counseled on daily weights and importance of knowing if weight gain >3 lbs in 1 day, >5 lbs in 1 week. Reviewed risk of exacerbation, admission if she  doesn't monitor appropriately at home. She agrees to contact cardiology if rapid weight gain.  . Will continue to collaborate w/ cardiology. Patient states she did not have transportation to the lab appointment to f/u on torsemide dose increase; will collaborate to see if hem/onc can check at her appt tomorrow.  . Reports daily episodes of urinary incontinence. Denies any urge, so she doesn't make it to the bathroom in time. Notes she has been using off-brand Depends due to the cost of having to change multiple times a day. Care Guide referral placed for support w/ Depends cost.    Hyperlipidemia: . Controlled; current treatment: rosuvastatin 10 mg daily;  . Recommended continue current regimen  Atrial Fibrillation: . Controlled; anticoagulant treatment: Eliquis 5 mg BID . Recommended  continue current regimen  Chronic Diarrhea: . Improved, current treatment cholestyramine 4 g daily.  . Meclizine PRN dizziness/nausea, ondansetron PRN nausea/vomiting.  . Recommended to continue current treatment at this time along with GI collaboration.  Patient Goals/Self-Care Activities . Over the next 90 days, patient will:  - take medications as prescribed check blood glucose periodically, document, and provide at future appointments weight daily, and contact provider if weight gain of > 3 lbs in 1 day, >5 lbs in 1 week  Follow Up Plan: Telephone follow up appointment with care management team member scheduled for: ~ 8 weeks        Plan: Telephone follow up appointment with care management team member scheduled for: ~ 8 weeks  [name], PharmD, [place], [place] Pharmacist [place] [phone]

## 2020-06-04 NOTE — Patient Instructions (Signed)
Visit Information  Patient Care Plan: Medication Management    Problem Identified: T2DM, HFpEF, pulmonary HTN, diarrhea     Long-Range Goal: Disease Progression Prevention   This Visit's Progress: On track  Recent Progress: On track  Priority: High  Note:   Current Barriers:  . Unable to maintain control of glycemic control . Polypharmacy, complex patient with multiple comorbidities   Pharmacist Clinical Goal(s):  [name] Over the next 90 days, patient will work with PharmD and provider towards optimized glycemic control . Over the next 90 days, patient will work with PharmD and provider towards optimized medication management  Interventions: . Inter-disciplinary care team collaboration (see longitudinal plan of care) . Comprehensive medication review performed; medication list updated in electronic medical record  Diabetes: . Controlled. Notes that she took 2 doses of [name] but experienced more stomach pain w/ injection and more nausea. Does not want to take it anymore.  . Current glucose readings: fasting and pre meal: 120-150 . Peripheral neuropathy: gabapentin 200 mg TID . Agree with decision to stop therapy and monitor. Encouraged to continue to check glucose periodically. If further glycemic benefit needed in the future, could consider SGLT2  Hypertension, Heart Failure, Pulmonary Hypertension . Managed by [facility], Cardiology; Current treatment: digoxin 0.125 mg daily, Opsumit 10 mg daily, potassium 20 mEq daily, Uptravi 1600 mcg BID, sildenafil citrate 20 mg TID, spironolactone 25 mg daily, torsemide 60 mg QAM, 40 mg QPM  . Home weights: this morning 190 lbs. Notes that yesterday she was 187 lbs. Denies SOB. I asked why she didn't call cardiology; she is afraid they will put her back on high doses of furosemide. Reviewed hx, she remembers that metolazone did not provide benefit, but she denies ever being told to take 30 minutes before dose of diuretic.   [name] Counseled on daily weights and importance of knowing if weight gain >3 lbs in 1 day, >5 lbs in 1 week. Reviewed risk of exacerbation, admission if she doesn't monitor appropriately at home. She agrees to contact cardiology if rapid weight gain.  . Will continue to collaborate w/ cardiology. Patient states she did not have transportation to the lab appointment to f/u on torsemide dose increase; will collaborate to see if hem/onc can check at her appt tomorrow.  . Reports daily episodes of urinary incontinence. Denies any urge, so she doesn't make it to the bathroom in time. Notes she has been using off-brand Depends due to the cost of having to change multiple times a day. Care Guide referral placed for support w/ Depends cost.    Hyperlipidemia: . Controlled; current treatment: rosuvastatin 10 mg daily;  . Recommended continue current regimen  Atrial Fibrillation: . Controlled; anticoagulant treatment: Eliquis 5 mg BID . Recommended continue current regimen  Chronic Diarrhea: . Improved, current treatment cholestyramine 4 g daily.  . Meclizine PRN dizziness/nausea, ondansetron PRN nausea/vomiting.  . Recommended to continue current treatment at this time along with GI collaboration.  Patient Goals/Self-Care Activities . Over the next 90 days, patient will:  - take medications as prescribed check blood glucose periodically, document, and provide at future appointments weight daily, and contact provider if weight gain of > 3 lbs in 1 day, >5 lbs in 1 week  Follow Up Plan: Telephone follow up appointment with care management team member scheduled for: ~ 8 weeks        The patient verbalized understanding of instructions, educational materials, and care plan provided today and declined offer to receive copy  of patient instructions, educational materials, and care plan.   Plan: Telephone follow up appointment with care management team member scheduled for: ~ 8 weeks  [name],  PharmD, [place], [place] Pharmacist [place] [phone]

## 2020-06-05 ENCOUNTER — Telehealth (HOSPITAL_COMMUNITY): Payer: Self-pay | Admitting: Pharmacist

## 2020-06-05 ENCOUNTER — Telehealth: Payer: Self-pay

## 2020-06-05 NOTE — Telephone Encounter (Signed)
Advanced Heart Failure Patient Advocate Encounter  Prior Authorization for Opsumit has been approved.    PA# [id number] Effective dates: 06/24/19 through 06/22/2021  Audry Riles, PharmD, BCPS, BCCP, CPP Heart Failure Clinic Pharmacist [phone]

## 2020-06-05 NOTE — Telephone Encounter (Signed)
    MA12/14/2021 1st Attempt  Name: Debra Saunders   MRN: [id number]   DOB: 1945-08-02   AGE: 74 y.o.   GENDER: female   PCP [name], MD.   Referral Reason: Incontinence supplies  Interventions: Patient provided with information about care guide support team and interviewed to confirm resource needs. Investigation of community resources performed Spoke with Burr Medico at [facility]. Emailed information to patient also included online coupon for Depends.  Follow up plan: Care guide will follow up with patient by phone over the next 7 days     Anjannette Gauger, AAS Paralegal, [place] . Embedded Care Coordination [facility]  Care Management  [address], [place] [address] [email or web address]  [phone]   [email or web address]

## 2020-06-06 ENCOUNTER — Inpatient Hospital Stay: Payer: Medicare HMO | Admitting: Oncology

## 2020-06-06 ENCOUNTER — Inpatient Hospital Stay: Payer: Medicare HMO | Attending: Internal Medicine

## 2020-06-06 ENCOUNTER — Inpatient Hospital Stay: Payer: Medicare HMO

## 2020-06-07 ENCOUNTER — Ambulatory Visit: Payer: Medicare HMO | Admitting: Pharmacist

## 2020-06-07 DIAGNOSIS — E1122 Type 2 diabetes mellitus with diabetic chronic kidney disease: Secondary | ICD-10-CM

## 2020-06-07 DIAGNOSIS — I272 Pulmonary hypertension, unspecified: Secondary | ICD-10-CM

## 2020-06-07 DIAGNOSIS — I10 Essential (primary) hypertension: Secondary | ICD-10-CM

## 2020-06-07 DIAGNOSIS — I5022 Chronic systolic (congestive) heart failure: Secondary | ICD-10-CM

## 2020-06-07 NOTE — Patient Instructions (Signed)
Visit Information  Patient Care Plan: Medication Management    Problem Identified: T2DM, HFpEF, pulmonary HTN, diarrhea     Long-Range Goal: Disease Progression Prevention   Recent Progress: On track  Priority: High  Note:   Current Barriers:  . Unable to maintain control of glycemic control . Polypharmacy, complex patient with multiple comorbidities   Pharmacist Clinical Goal(s):  [name] Over the next 90 days, patient will work with PharmD and provider towards optimized glycemic control . Over the next 90 days, patient will work with PharmD and provider towards optimized medication management  Interventions: . Inter-disciplinary care team collaboration (see longitudinal plan of care) . Comprehensive medication review performed; medication list updated in electronic medical record  Diabetes: . Diet controlled. Previous hx [name] (d/c d/t significant GI upset with recent restart) . Peripheral neuropathy: gabapentin 200 mg TID . Continue current regimen. Can consider addition of SGLT2 in the future, if needed  Hypertension, Heart Failure, Pulmonary Hypertension . Managed by [facility], Cardiology; Current treatment: digoxin 0.125 mg daily, Opsumit 10 mg daily, potassium 20 mEq daily, Uptravi 1600 mcg BID, sildenafil citrate 20 mg TID, spironolactone 25 mg daily, torsemide 60 mg QAM, 40 mg QPM  . Received request from Dr. [name] to f/u on patient's weight. She reports that her weight this morning was 190 lbs, and this has been stable for the past 3 days. No increased SOB or swelling. Has f/u with cardiology face to face next week . Has not been able to get digoxin lab or repeat BMP since increase in torsemide d/t transportation concerns. Patient has previously been given transportation information for her insurance.   Hyperlipidemia: . Controlled; current treatment: rosuvastatin 10 mg daily;  . Recommended continue current regimen  Atrial  Fibrillation: . Controlled; anticoagulant treatment: Eliquis 5 mg BID . Recommended continue current regimen  Chronic Diarrhea: . Improved, current treatment cholestyramine 4 g daily.  . Meclizine PRN dizziness/nausea, ondansetron PRN nausea/vomiting.  . Recommended to continue current treatment at this time along with GI collaboration.  Anemia: . Not managed. Follows w/ hematology/oncology for iron infusions. Reports that she was supposed to go yesterday, but transportation never arrived, even though she confirmed multiple times. She reports that her daughter attempted to call the cancer center to r/s, but never got through to someone. She reports that [name] (EMT HF) called her yesterday to schedule her next visit, and was planning on communicating with hem/onc on her behalf.  . Encouraged patient to f/u with hem/onc later today if she does not hear from them. I will also message Faythe Casa.   Patient Goals/Self-Care Activities . Over the next 90 days, patient will:  - take medications as prescribed check blood glucose periodically, document, and provide at future appointments weight daily, and contact provider if weight gain of > 3 lbs in 1 day, >5 lbs in 1 week  Follow Up Plan: Telephone follow up appointment with care management team member scheduled for: ~ 8 weeks        The patient verbalized understanding of instructions, educational materials, and care plan provided today and declined offer to receive copy of patient instructions, educational materials, and care plan.   Plan: Telephone follow up appointment with care management team member scheduled for: ~ 8 weeks  [name], PharmD, [place], [place] Clinical Pharmacist [employer] at [employer] [phone]

## 2020-06-07 NOTE — Chronic Care Management (AMB) (Signed)
Chronic Care Management   Pharmacy Note  06/07/2020 Name: Debra Saunders MRN: [id number] DOB: [birth year]  Subjective:  Debra Saunders is a 74 y.o. year old female who is a primary care patient of [name], [name], MD. The CCM team was consulted for assistance with chronic disease management and care coordination needs.    Engaged with patient by telephone for follow up to CCM visit earlier this week per PCP in response to provider referral for pharmacy case management and/or care coordination services.   Consent to Services:  Debra Saunders was given information about Chronic Care Management services, agreed to services, and gave verbal consent prior to initiation of services on 02/17/19. Please see initial visit note for detailed documentation.   Objective:  Lab Results  Component Value Date   CREATININE 1.08 04/10/2020   CREATININE 0.89 03/26/2020   CREATININE 1.13 (H) 01/30/2020    Lab Results  Component Value Date   HGBA1C 5.4 04/10/2020       Component Value Date/Time   CHOL 100 02/03/2019 1413   CHOL 137 02/03/2013 0441   TRIG 121.0 02/03/2019 1413   TRIG 197 02/03/2013 0441   HDL 51.00 02/03/2019 1413   HDL 25 (L) 02/03/2013 0441   CHOLHDL 2 02/03/2019 1413   VLDL 24.2 02/03/2019 1413   VLDL 39 02/03/2013 0441   LDLCALC 25 02/03/2019 1413   LDLCALC 73 02/03/2013 0441   Lab Results  Component Value Date   WBC 5.6 04/10/2020   HGB 10.1 (L) 04/10/2020   HCT 31.8 (L) 04/10/2020   MCV 87.9 04/10/2020   PLT 187.0 04/10/2020     BP Readings from Last 3 Encounters:  05/29/20 138/76  05/02/20 122/66  04/30/20 124/68    Assessment/Interventions: Review of patient past medical history, allergies, medications, health status, including review of consultants reports, laboratory and other test data, was performed as part of comprehensive evaluation and provision of chronic care management services.   SDOH (Social Determinants of Health) assessments and  interventions performed:  SDOH Interventions   Flowsheet Row Most Recent Value  SDOH Interventions   SDOH Interventions for the Following Domains Transportation  Financial Strain Interventions Other (Comment)  [patient aware of insurance resources, [place] resources]       CCM Care Plan  Allergies  Allergen Reactions  . Aspirin Hives and Other (See Comments)    Pt states that she is unable to take because she had bleeding in her brain  . Other Other (See Comments)    Pt states that she is unable to take blood thinners because she had bleeding in her brain; Blood Thinners-per doctor at [facility]  . Penicillins Hives, Shortness Of Breath, Swelling and Other (See Comments)    Has patient had a PCN reaction causing immediate rash, facial/tongue/throat swelling, SOB or lightheadedness with hypotension: Yes Has patient had a PCN reaction causing severe rash involving mucus membranes or skin necrosis: No Has patient had a PCN reaction that required hospitalization No Has patient had a PCN reaction occurring within the last 10 years: No If all of the above answers are "NO", then may proceed with Cephalosporin use.    Medications Reviewed Today    Reviewed by De Hollingshead, RPH-CPP (Pharmacist) on 06/04/20 at 51  Med List Status: <None>  Medication Order Taking? Sig Documenting Provider Last Dose Status Informant  acetaminophen (TYLENOL) 325 MG tablet [id number] Yes Take 325-650 mg by mouth every 6 (six) hours as needed for mild pain. Reported on  10/10/2015 [provider] Taking Active Self  albuterol (PROVENTIL) (2.5 MG/3ML) 0.083% nebulizer solution [id number] Yes Take 3 mLs (2.5 mg total) by nebulization every 6 (six) hours.  Patient taking differently: Take 2.5 mg by nebulization every 6 (six) hours as needed.   Loletha Grayer, MD Taking Active Self  cholestyramine (QUESTRAN) 4 g packet [id number] Yes Take 4 g by mouth daily.  [provider] Taking Active    digoxin (LANOXIN) 0.125 MG tablet [id number] Yes Take 1 tablet by mouth once daily Larey Dresser, MD Taking Active   ELIQUIS 5 MG TABS tablet [id number] Yes Take 1 tablet by mouth twice daily Larey Dresser, MD Taking Active   gabapentin (NEURONTIN) 100 MG capsule [id number] Yes TAKE 2 CAPSULES BY MOUTH THREE TIMES DAILY [name], MD Taking Active   glucose blood test strip [id number] Yes Use with True Metrix glucometer to check sugars BID [name], MD Taking Active   Lancets MISC [id number] Yes Use with True Metrix glucometer to check sugars BID [name], MD Taking Active   macitentan (OPSUMIT) 10 MG tablet [id number] Yes Take 1 tablet (10 mg total) by mouth daily. Larey Dresser, MD Taking Active   meclizine (ANTIVERT) 25 MG tablet [id number] No Take 1 tablet (25 mg total) by mouth 3 (three) times daily as needed for dizziness.  Patient not taking: Reported on 06/04/2020   [name], MD Not Taking Active   Menthol, Topical Analgesic, [facility] EX) [id number] Yes Apply 1 application topically as needed (for pain).  [provider] Taking Active Self  ondansetron (ZOFRAN) 8 MG tablet [id number] No Take 1 tablet (8 mg total) by mouth every 8 (eight) hours as needed for nausea or vomiting.  Patient not taking: No sig reported   Jackolyn Confer, MD Not Taking Active Self           Med Note Cyndra Numbers Aug 16, 2017  9:43 AM)    OXYGEN [id number] Yes Inhale 4 L into the lungs continuous. [provider] Taking Active Self  potassium chloride SA (KLOR-CON) 20 MEQ tablet [id number] Yes Take 1 tablet (20 mEq total) by mouth daily. [name], MD Taking Active   rosuvastatin (CRESTOR) 10 MG tablet [id number] Yes Take 1 tablet (10 mg total) by mouth daily. Larey Dresser, MD Taking Active   Selexipag (UPTRAVI) 1600 MCG TABS [id number] Yes Take 1,600 mcg by mouth 2 (two) times daily. [provider] Taking Active Self   sildenafil (REVATIO) 20 MG tablet [id number] Yes Take 1 tablet (20 mg total) by mouth 3 (three) times daily. Larey Dresser, MD Taking Active   spironolactone (ALDACTONE) 25 MG tablet [id number] Yes Take 1 tablet (25 mg total) by mouth at bedtime. Please call for a office visit [phone] Larey Dresser, MD Taking Active   torsemide [facility]) 20 MG tablet [id number] Yes Take 3 tablets (60 mg) by mouth in morning & 2 tablets (40 mg) by mouth in the afternoon [provider] Taking Active           Patient Active Problem List   Diagnosis Date Noted  . Burn 04/30/2020  . Obesity (BMI 30-39.9) 04/30/2020  . Elevated alkaline phosphatase level 04/30/2020  . Chronic diarrhea 11/04/2019  . Dysuria 09/29/2019  . Abnormal mammogram 09/29/2019  . Breast pain, right 08/08/2019  . Bilateral ovarian cysts 08/08/2019  . B12 deficiency 08/02/2019  . Iron deficiency 08/01/2019  . Dizziness 07/06/2019  .  Shortness of breath   . Atrial fibrillation ([place]) 09/22/2017  . Acute bilateral low back pain without sciatica 01/28/2017  . Pain in limb 07/22/2016  . Neuropathy 06/09/2016  . Pain in the chest 03/10/2016  . Chronic deep vein thrombosis (DVT) (H[facility]) 03/03/2016  . Chronic back pain 10/19/2015  . Goiter 04/25/2015  . Cervical disc disorder with radiculopathy of cervical region 03/30/2015  . Elevated LFTs 06/29/2014  . 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 Abnormality of gait 01/13/2014  . Sinoatrial node dysfunction ([place]) 01/09/2014  . Allergic rhinitis 10/26/2013  . Lymphedema 08/03/2013  . Constipation 04/12/2013  . Musculoskeletal chest pain 01/19/2013  . Fatigue 09/27/2012  . Anemia 04/28/2012  . Chronic respiratory failure with hypoxia ([place]) 04/26/2012  . Diabetes ([place]) 03/30/2012  . Pulmonary hypertension, unspecified ([place]) 03/30/2012  . Hyperlipidemia 03/30/2012  . Hypothyroidism 03/30/2012  . Chronic systolic heart failure ([place]) 01/22/2012  . Hypertension      Conditions to be addressed/monitored per PCP order: CHF, HLD and DMII  Patient Care Plan: Medication Management    Problem Identified: T2DM, HFpEF, pulmonary HTN, diarrhea     Long-Range Goal: Disease Progression Prevention   Recent Progress: On track  Priority: High  Note:   Current Barriers:  . Unable to maintain control of glycemic control . Polypharmacy, complex patient with multiple comorbidities   Pharmacist Clinical Goal(s):  [name] Over the next 90 days, patient will work with PharmD and provider towards optimized glycemic control . Over the next 90 days, patient will work with PharmD and provider towards optimized medication management  Interventions: . Inter-disciplinary care team collaboration (see longitudinal plan of care) . Comprehensive medication review performed; medication list updated in electronic medical record  Diabetes: . Diet controlled. Previous hx [name] (d/c d/t significant GI upset with recent restart) . Peripheral neuropathy: gabapentin 200 mg TID . Continue current regimen. Can consider addition of SGLT2 in the future, if needed  Hypertension, Heart Failure, Pulmonary Hypertension . Managed by [facility], Cardiology; Current treatment: digoxin 0.125 mg daily, Opsumit 10 mg daily, potassium 20 mEq daily, Uptravi 1600 mcg BID, sildenafil citrate 20 mg TID, spironolactone 25 mg daily, torsemide 60 mg QAM, 40 mg QPM  . Received request from Dr. [name] to f/u on patient's weight. She reports that her weight this morning was 190 lbs, and this has been stable for the past 3 days. No increased SOB or swelling. Has f/u with cardiology face to face next week . Has not been able to get digoxin lab or repeat BMP since increase in torsemide d/t transportation concerns. Patient has previously been given transportation information for her insurance.   Hyperlipidemia: . Controlled; current treatment: rosuvastatin 10 mg daily;  . Recommended  continue current regimen  Atrial Fibrillation: . Controlled; anticoagulant treatment: Eliquis 5 mg BID . Recommended continue current regimen  Chronic Diarrhea: . Improved, current treatment cholestyramine 4 g daily.  . Meclizine PRN dizziness/nausea, ondansetron PRN nausea/vomiting.  . Recommended to continue current treatment at this time along with GI collaboration.  Anemia: . Not managed. Follows w/ hematology/oncology for iron infusions. Reports that she was supposed to go yesterday, but transportation never arrived, even though she confirmed multiple times. She reports that her daughter attempted to call the cancer center to r/s, but never got through to someone. She reports that [name] (EMT HF) called her yesterday to schedule her next visit, and was planning on communicating with hem/onc on her behalf.  . Encouraged patient to  f/u with hem/onc later today if she does not hear from them. I will also message Faythe Casa.   Patient Goals/Self-Care Activities . Over the next 90 days, patient will:  - take medications as prescribed check blood glucose periodically, document, and provide at future appointments weight daily, and contact provider if weight gain of > 3 lbs in 1 day, >5 lbs in 1 week  Follow Up Plan: Telephone follow up appointment with care management team member scheduled for: ~ 8 weeks       Medication Assistance: None required. Patient affirms current coverage meets needs.  Collaboration with [name] on medication access for pulmonary HTN medications  Plan: Telephone follow up appointment with care management team member scheduled for: ~ 8 weeks  [name], PharmD, [place], [place] Clinical Pharmacist [employer] [facility] [employer] [phone]

## 2020-06-07 NOTE — Progress Notes (Signed)
Remote pacemaker transmission.   

## 2020-06-08 ENCOUNTER — Telehealth: Payer: Self-pay

## 2020-06-08 NOTE — Telephone Encounter (Signed)
    MA12/17/2021   Name: Debra Saunders   MRN: [id number]   DOB: 1946/03/18   AGE: 74 y.o.   GENDER: female   PCP [name], MD.   Referral Reason: Incontinence supplies resource  Interventions: Successful outbound call placed to the patient Follow up call placed to the patient to discuss status of referral Patient received email sent on 06/05/20 information for [place] [facility]  Follow up plan: No further follow up planned at this time. The patient has been provided with needed resources. and closing referral    Elmond Poehlman, AAS Paralegal, [place] . Embedded Care Coordination [facility] Health  Care Management  [address], [place] [address] [email or web address]  [phone]   [email or web address]

## 2020-06-12 ENCOUNTER — Telehealth (HOSPITAL_COMMUNITY): Payer: Self-pay | Admitting: Pharmacist

## 2020-06-12 ENCOUNTER — Other Ambulatory Visit: Payer: Self-pay | Admitting: Family Medicine

## 2020-06-12 ENCOUNTER — Encounter (HOSPITAL_COMMUNITY): Payer: Self-pay

## 2020-06-12 ENCOUNTER — Other Ambulatory Visit (HOSPITAL_COMMUNITY): Payer: Self-pay

## 2020-06-12 ENCOUNTER — Other Ambulatory Visit (HOSPITAL_COMMUNITY): Payer: Self-pay | Admitting: Cardiology

## 2020-06-12 NOTE — Progress Notes (Signed)
Today had a home visit with Debra Saunders. She says she has been doing well.  She has all her medications and appears to be taking them.  Refilled her med boxes up.  Called in refills for her.  She will call in opsumit and sildinafil.   She denies any problems today.  She denies chest pain, headaches or increased shortness of breath.  She has been trying to diet and watch will closely what she is eating.  Discussed the holidays coming up.  She has a lot of family support.  She will staying with her daughter for 2 days at Christmas.  She is aware of her caridology appt.  She does light housework around her home.  She has been cooking more lately and making crafts for Christmas. Will continue to visit for heart failure, medication management and diet.   [place] [phone]

## 2020-06-12 NOTE — Telephone Encounter (Signed)
Patient Advocate Encounter   Received notification from [facility] that prior authorization for Debra Saunders is required.   PA submitted on CoverMyMeds Key [id number] Status is pending   Will continue to follow.   Audry Riles, PharmD, BCPS, BCCP, CPP Heart Failure Clinic Pharmacist [phone]

## 2020-06-13 ENCOUNTER — Other Ambulatory Visit: Payer: Self-pay

## 2020-06-13 ENCOUNTER — Ambulatory Visit: Payer: Medicare HMO | Admitting: Nurse Practitioner

## 2020-06-13 ENCOUNTER — Encounter: Payer: Self-pay | Admitting: Nurse Practitioner

## 2020-06-13 ENCOUNTER — Other Ambulatory Visit (HOSPITAL_COMMUNITY): Payer: Self-pay

## 2020-06-13 VITALS — BP 110/70 | HR 60 | Ht 60.0 in | Wt 190.0 lb

## 2020-06-13 DIAGNOSIS — I480 Paroxysmal atrial fibrillation: Secondary | ICD-10-CM | POA: Diagnosis not present

## 2020-06-13 DIAGNOSIS — I130 Hypertensive heart and chronic kidney disease with heart failure and stage 1 through stage 4 chronic kidney disease, or unspecified chronic kidney disease: Secondary | ICD-10-CM

## 2020-06-13 DIAGNOSIS — I2721 Secondary pulmonary arterial hypertension: Secondary | ICD-10-CM

## 2020-06-13 DIAGNOSIS — I4819 Other persistent atrial fibrillation: Secondary | ICD-10-CM

## 2020-06-13 DIAGNOSIS — I5032 Chronic diastolic (congestive) heart failure: Secondary | ICD-10-CM | POA: Diagnosis not present

## 2020-06-13 DIAGNOSIS — I428 Other cardiomyopathies: Secondary | ICD-10-CM | POA: Diagnosis not present

## 2020-06-13 DIAGNOSIS — I270 Primary pulmonary hypertension: Secondary | ICD-10-CM

## 2020-06-13 MED ORDER — TORSEMIDE 20 MG PO TABS
40.0000 mg | ORAL_TABLET | Freq: Two times a day (BID) | ORAL | 3 refills | Status: DC
Start: 2020-06-13 — End: 2020-08-24

## 2020-06-13 MED ORDER — OPSUMIT 10 MG PO TABS: 10.0000 mg | ORAL_TABLET | Freq: Every day | ORAL | 11 refills | Status: DC

## 2020-06-13 NOTE — Patient Instructions (Signed)
Medication Instructions:  Your physician has recommended you make the following change in your medication:  1. DECREASE Torsemide 20 mg and take 2 tablets (40 mg) twice a day  *If you need a refill on your cardiac medications before your next appointment, please call your pharmacy*   Lab Work: BMET today  If you have labs (blood work) drawn today and your tests are completely normal, you will receive your results only by:  [place] (if you have MyChart) OR  A paper copy in the mail If you have any lab test that is abnormal or we need to change your treatment, we will call you to review the results.   Testing/Procedures: None   Follow-Up: At [facility], you and your health needs are our priority.  As part of our continuing mission to provide you with exceptional heart care, we have created designated Provider Care Teams.  These Care Teams include your primary Cardiologist (physician) and Advanced Practice Providers (APPs -  Physician Assistants and Nurse Practitioners) who all work together to provide you with the care you need, when you need it.   Your next appointment:   3 month(s)  The format for your next appointment:   In Person  Provider:   Dr. [name] at [facility]

## 2020-06-13 NOTE — Progress Notes (Signed)
Office Visit    Patient Name: Debra Saunders Date of Encounter: 06/13/2020  Primary Care Provider:  [name], MD Primary Cardiologist:  [name], MD  Chief Complaint    74 y/o?    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 with improved EF, PAH, nonobs CAD (09/2017),persistent atrial fibrillation Eliquis, symptomatic bradycardia status post permanent pacemaker, frequent PVCs, chronic respiratory failure with hypoxia on home O2 secondary to COPD,interstitial lung disease,pulmonary hypertension, type 2 diabetes mellitus, hypertension, hyperlipidemia, obesity, traumatic subarachnoid hemorrhage in the setting of Xarelto, left-sided DVT status post IVC filter, chronic right-sided atypical chest pain, chronic left lower extremity lymphedema, prior stroke, sleep apnea, and cardiorenal syndrome, who presents for f/u of CHF.  Past Medical History    Past Medical History:  Diagnosis Date  . Arthritis   . Atrial fibrillation, persistent ([place])    a. s/p TEE-guided DCCV on 08/20/2017  . Cardiorenal syndrome    a. 09/2017 Creat rose to 2.7 w/ diuresis-->HD x 2.  . Chest pain    a. 01/2012 Cath: nonobs dzs;  c. 04/2014 MV: No ischemia.  . Chronic back pain   . Chronic combined systolic (congestive) and diastolic (congestive) heart failure ([place])    a. Dx 2005 @ [facility];  b. 02/2014 Echo: EF 55-60%; c. 07/2017: EF 30-35%, diffuse HK, trivial AI, severe MR, moderate TR; d. 09/2017 Echo: EF 40-45%; e. 02/2018 Echo: EF 55-60%, Gr1 DD; f. 08/2019 Echo: EF 60-65%, Gr2 DD, Nl RV fxn, RVSP 49.51mmHg. Mod-Sev MR. Mild to mod TR. Mod PR. RA ~ 37mmHg.  [name] Chronic respiratory failure (HCC)    a. on home O2  . COPD (chronic obstructive pulmonary disease) ([place])    a. Home O2 - 3lpm  . Gastritis   . History of intracranial hemorrhage    a. 6/14 described by neurosurgery as "small frontal posttraumatic SAH " - pt was on xarelto @ the time.  . Hyperlipidemia   .  Hypertension   . Interstitial lung disease ([place])   . Lipoma of back   . Lymphedema    a. Chronic LLE edema.  [name] NICM (nonischemic cardiomyopathy) ([place])    a. 09/2017 Echo: EF 40-45%; b. 09/2017 Cath: minor, insignificant CAD; c. 02/2018 Echo: EF 55-60%, Gr1 DD; d. 08/2019 Echo: EF 60-65%, Gr2 DD.  [name] Obesity   . PAH (pulmonary artery hypertension) ([place])    a. 09/2017 Echo: PASP 7mmHg; b. 09/2017 RHC: RA 23, RV 84/13, PA 84/29, PCWP 20, CO 3.89, CI 1.89. LVEDP 18.  . Sepsis with metabolic encephalopathy ([place])   . Sleep apnea   . Streptococcal bacteremia   . Stroke ([place])   . Subarachnoid hemorrhage ([place]) 01/19/2013  . Symptomatic bradycardia    a. s/p BSX PPM.  . Thyroid nodule   . Type II diabetes mellitus ([place])   . Urinary incontinence    Past Surgical History:  Procedure Laterality Date  . ABDOMINAL HYSTERECTOMY  1969  . BREAST BIOPSY Right 10/28/2019   lymph node bx, [place] Bx, pending path   . CARDIAC CATHETERIZATION  2013   @ [place]: No obstructive CAD: Only 20% ostial left circumflex.  [name] CARDIOVERSION N/A 10/12/2017   Procedure: CARDIOVERSION;  Surgeon: Minna Merritts, MD;  Location: [facility] ORS;  Service: Cardiovascular;  Laterality: N/A;  . CHOLECYSTECTOMY    . COLONOSCOPY WITH PROPOFOL N/A 09/10/2018   Procedure: COLONOSCOPY WITH PROPOFOL;  Surgeon: Manya Silvas, MD;  Location: [facility] ENDOSCOPY;  Service: Endoscopy;  Laterality: N/A;  . CYST REMOVAL TRUNK  2002   BACK  . DIALYSIS/PERMA CATHETER INSERTION N/A 10/06/2017   Procedure: DIALYSIS/PERMA CATHETER INSERTION;  Surgeon: Katha Cabal, MD;  Location: [place] CV LAB;  Service: Cardiovascular;  Laterality: N/A;  . INSERT / REPLACE / REMOVE PACEMAKER    . lipoma removal  2014   back  . PACEMAKER INSERTION  [address] [employer] dual chamber pacemaker implanted by Dr Caryl Comes for symptomatic bradycardia  . PERMANENT PACEMAKER INSERTION N/A 01/09/2014   Procedure: PERMANENT PACEMAKER INSERTION;  Surgeon: Deboraha Sprang,  MD;  Location: [facility] CATH LAB;  Service: Cardiovascular;  Laterality: N/A;  . RIGHT HEART CATH N/A 04/19/2018   Procedure: RIGHT HEART CATH;  Surgeon: Larey Dresser, MD;  Location: [place] [facility] LAB;  Service: Cardiovascular;  Laterality: N/A;  . RIGHT/LEFT HEART CATH AND CORONARY ANGIOGRAPHY N/A 10/19/2017   Procedure: RIGHT/LEFT HEART CATH AND CORONARY ANGIOGRAPHY;  Surgeon: Wellington Hampshire, MD;  Location: [place] [facility];  Service: Cardiovascular;  Laterality: N/A;  . TEE WITHOUT CARDIOVERSION N/A 08/20/2017   Procedure: TRANSESOPHAGEAL ECHOCARDIOGRAM (TEE) & Direct current cardioversion;  Surgeon: Minna Merritts, MD;  Location: [facility] ORS;  Service: Cardiovascular;  Laterality: N/A;  . TRIGGER FINGER RELEASE      Allergies  Allergies  Allergen Reactions  . Aspirin Hives and Other (See Comments)    Pt states that she is unable to take because she had bleeding in her brain  . Other Other (See Comments)    Pt states that she is unable to take blood thinners because she had bleeding in her brain; Blood Thinners-per doctor [facility]  . Penicillins Hives, Shortness Of Breath, Swelling and Other (See Comments)    Has patient had a PCN reaction causing immediate rash, facial/tongue/throat swelling, SOB or lightheadedness with hypotension: Yes Has patient had a PCN reaction causing severe rash involving mucus membranes or skin necrosis: No Has patient had a PCN reaction that required hospitalization No Has patient had a PCN reaction occurring within the last 10 years: No If all of the above answers are "NO", then may proceed with Cephalosporin use.    History of Present Illness    74 y/o?   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 with improved EF, PAH, nonobs CAD (09/2017),persistent atrial fibrillation Eliquis, symptomatic bradycardia status post permanent pacemaker, frequent PVCs, chronic respiratory failure with hypoxia on home O2 secondary to  COPD,interstitial lung disease (amiodarone previously discontinued out of concern for toxicity),pulmonary hypertension, type 2 diabetes mellitus, hypertension, hyperlipidemia, obesity, traumatic subarachnoid hemorrhage in the setting of Xarelto, left-sided DVT status post IVC filter, chronic right-sided atypical chest pain, chronic left lower extremity lymphedema, prior stroke, sleep apnea, and cardiorenal syndrome. She previously underwent diagnostic catheterization August 2013 with nonobstructive disease followed by negative stress testing in November 2015. She has a long history of diastolic heart failure and at one point in February 2019, EF was down to 30-35% but this subsequently improved. She was previously followed by the heart failure team in [facility] and last underwent right heart catheterization October 2019 showing moderate pulmonary hypertension but overall optimized filling pressures. She has since been maintained on uptravi, opsumit, and revatio.Most recent echocardiogram in March 2021 showed an EF of 10-93%, grade 2 diastolic dysfunction, moderate to severe mitral vegetation, mild to moderate TR, and an RVSP of 49.7 mmHg.    She has chronic lightheadedness worsened by position changes and sometimes is  associated with near syncope.  Previous pacemaker interrogations have not revealed any significant arrhythmias.  Over the summer 2021, she reported poor appetite, diarrhea, and weight loss.  Torsemide dose was reduced to 40 mg twice daily at that time.  In November follow-up, she reported improvement in appetite and also resolution of diarrhea, though stools remain soft and frequent (5-6 bowel movements per day).  In the setting of improved appetite weight was up 10 pounds and she reported that most of that weight gain occurred in the past 2 weeks.  ReDS Vest was applied and was elevated at 40%.  She increased torsemide to 60 mg in the morning and 40 mg in the afternoon.  Since doing so, she  hasn't really noticed any change in weight (187 on her home scale).  She has chronic left lower ext swelling, that his worse @ the end of the day.  She tries to keep her L leg elevated, but does not wear her compression stocking or routinely use compression pumps.  She has chronic, stable dyspnea on exertion and denies chest pain, palpitations, PND, orthopnea, dizziness, syncope, or early satiety.  She wishes to reduce torsemide back to 40 mg twice daily.  Home Medications    Prior to Admission medications   Medication Sig Start Date End Date Taking? Authorizing Provider  acetaminophen (TYLENOL) 325 MG tablet Take 325-650 mg by mouth every 6 (six) hours as needed for mild pain. Reported on 10/10/2015    [provider]  albuterol (PROVENTIL) (2.5 MG/3ML) 0.083% nebulizer solution Take 3 mLs (2.5 mg total) by nebulization every 6 (six) hours. Patient taking differently: Take 2.5 mg by nebulization every 6 (six) hours as needed. 08/28/17   Loletha Grayer, MD  apixaban (ELIQUIS) 5 MG TABS tablet Take 1 tablet (5 mg total) by mouth 2 (two) times daily. Needs appt for future refills 06/12/20   Larey Dresser, MD  cholestyramine Lucrezia Starch) 4 g packet Take 4 g by mouth daily.  04/04/20 04/04/21  [provider]  digoxin (LANOXIN) 0.125 MG tablet Take 1 tablet by mouth once daily 08/25/19   Larey Dresser, MD  gabapentin (NEURONTIN) 100 MG capsule TAKE 2 CAPSULES BY MOUTH THREE TIMES DAILY 06/12/20   [name], MD  glucose blood test strip Use with True Metrix glucometer to check sugars BID 11/14/19   [name], MD  Lancets MISC Use with True Metrix glucometer to check sugars BID 11/14/19   [name], MD  macitentan (OPSUMIT) 10 MG tablet Take 1 tablet (10 mg total) by mouth daily. 06/13/20   Larey Dresser, MD  meclizine (ANTIVERT) 25 MG tablet Take 1 tablet (25 mg total) by mouth 3 (three) times daily as needed for dizziness. Patient not taking: Reported on  06/12/2020 11/04/19   [name], MD  Menthol, Topical Analgesic, (BIOFREEZE EX) Apply 1 application topically as needed (for pain).     [provider]  ondansetron (ZOFRAN) 8 MG tablet Take 1 tablet (8 mg total) by mouth every 8 (eight) hours as needed for nausea or vomiting. 09/25/14   Jackolyn Confer, MD  OXYGEN Inhale 4 L into the lungs continuous.    [provider]  potassium chloride SA (KLOR-CON) 20 MEQ tablet Take 1 tablet (20 mEq total) by mouth daily. 12/22/19   [name], MD  rosuvastatin (CRESTOR) 10 MG tablet Take 1 tablet (10 mg total) by mouth daily. 05/29/20   Larey Dresser, MD  Selexipag (  UPTRAVI) 1600 MCG TABS Take 1,600 mcg by mouth 2 (two) times daily.    [provider]  sildenafil (REVATIO) 20 MG tablet Take 1 tablet (20 mg total) by mouth 3 (three) times daily. 07/11/19   Larey Dresser, MD  spironolactone (ALDACTONE) 25 MG tablet Take 1 tablet (25 mg total) by mouth at bedtime. Please call for a office visit [phone] 04/19/20   Larey Dresser, MD  torsemide (DEMADEX) 20 MG tablet Take 3 tablets (60 mg) by mouth in morning & 2 tablets (40 mg) by mouth in the afternoon    [provider]    Review of Systems    Chronic dyspnea on exertion, which has been stable.  No significant change in weight on higher dose of torsemide.  She continues have left lower extremity swelling which is largely dependent in nature.  She denies chest pain, palpitations, PND, orthopnea, dizziness, syncope, or early satiety.  All other systems reviewed and are otherwise negative except as noted above.  Physical Exam    VS:  BP 110/70 (BP Location: Right Arm, Patient Position: Sitting, Cuff Size: Large)   Pulse 60   Ht 5' (1.524 m)   Wt 190 lb (86.2 kg)   SpO2 95% Comment: on 4L O2  BMI 37.11 kg/m  , BMI Body mass index is 37.11 kg/m. GEN: Well nourished, well developed, in no acute distress. HEENT: normal. Neck: Supple, no JVD,  carotid bruits, or masses. Cardiac: RRR, occasional ectopy, 2/6 systolic murmur loudest along the left sternal border, no  rubs, or gallops. No clubbing, cyanosis.  1+ left lower extremity edema.  Radials 2+, PT 1+ and equal bilaterally.  Respiratory:  Respirations regular and unlabored, clear to auscultation bilaterally. GI: Obese, soft, nontender, nondistended, BS + x 4. MS: no deformity or atrophy. Skin: warm and dry, no rash. Neuro:  Strength and sensation are intact. Psych: Normal affect.  Accessory Clinical Findings    Lab Results  Component Value Date   WBC 5.6 04/10/2020   HGB 10.1 (L) 04/10/2020   HCT 31.8 (L) 04/10/2020   MCV 87.9 04/10/2020   PLT 187.0 04/10/2020   Lab Results  Component Value Date   CREATININE 1.08 04/10/2020   BUN 30 (H) 04/10/2020   NA 139 04/10/2020   K 3.8 04/10/2020   CL 102 04/10/2020   CO2 31 04/10/2020   Lab Results  Component Value Date   ALT 7 04/10/2020   AST 13 04/10/2020   ALKPHOS 163 (H) 04/20/2020   BILITOT 0.4 04/10/2020   Lab Results  Component Value Date   CHOL 100 02/03/2019   HDL 51.00 02/03/2019   LDLCALC 25 02/03/2019   TRIG 121.0 02/03/2019   CHOLHDL 2 02/03/2019    Lab Results  Component Value Date   HGBA1C 5.4 04/10/2020    Assessment & Plan    1.  Chronic heart failure with preserved ejection fraction/nonischemic cardiomyopathy: EF previously low was 30 to 35% in February 2019 with improvement to 60-65% with grade 2 diastolic dysfunction by echo in March 2021.  Stable chronic dyspnea and left lower extremity swelling.  Weight was up 10 pounds at November visit with ReDS Vest reading mildly elevated at 40%.  She has been on torsemide 60 mg in the morning and 40 mg in the afternoon since then has noticed increased urine output though no significant change in weight-trending 187 pounds at home.  Ongoing left lower extremity swelling on exam today, which is chronic and somewhat dependent.  Follow-up basic metabolic  panel today.  She would like to reduce torsemide back to 40 mg twice daily and advised that she may with the caveat being that she would take an additional 20 mg for weight gain.  I encouraged her to use her compression hose and pumps for left lower extremity swelling.  Heart rate and blood pressure are stable and she remains on spironolactone therapy.  2.  Pulmonary arterial hypertension: Stable, chronic dyspnea requiring home O2.  She remains on Opsumit, Revatio, and Uptravi.  It looks like has been over a year since she has been seen in our heart failure/pulmonary hypertension clinic in [place] and we will arrange for follow-up there in approximately 3 months.  3.  Presyncope/chronic lightheadedness with orthostasis: This was not a complaint today but is known to be chronic.  4.  Persistent atrial fibrillation: Regular on examination today.  AV paced on ECG last month.  Continue digoxin and Eliquis therapy.  5.  Chronic respiratory failure/COPD/interstitial lung disease: On home O2.  No wheezing or crackles on examination today.  Amiodarone previously discontinued secondary to concerns related to interstitial lung disease.  Followed by pulmonology.  6.  Hyperlipidemia: Remains on statin therapy with normal LFTs in October.  LDL of 25 last year.  7.  History of cardiorenal syndrome: Follow-up basic metabolic panel today.  8.  Type 2 diabetes mellitus: A1c 5.4 in October.  Now off of [name].  Could consider SGLT2 inhibitor in the future though would need to be mindful of chronic lightheadedness and orthostasis.  9.  Disposition: Follow-up basic metabolic panel.  We will arrange for heart failure/pulmonary hypertension clinic follow-up in approximately 3 months.  Murray Hodgkins, NP 06/13/2020, 3:54 PM

## 2020-06-13 NOTE — Telephone Encounter (Signed)
Advanced Heart Failure Patient Advocate Encounter  Prior Authorization for Uptravi has been approved.    Effective dates: 06/23/20 through 06/22/21  Jordyne Poehlman, PharmD, BCPS, BCCP, CPP Heart Failure Clinic Pharmacist [phone]   

## 2020-06-14 ENCOUNTER — Telehealth: Payer: Self-pay | Admitting: Nurse Practitioner

## 2020-06-14 ENCOUNTER — Telehealth: Payer: Self-pay | Admitting: *Deleted

## 2020-06-14 LAB — BASIC METABOLIC PANEL
BUN/Creatinine Ratio: 23 (ref 12–28)
BUN: 23 mg/dL (ref 8–27)
CO2: 25 mmol/L (ref 20–29)
Calcium: 8.8 mg/dL (ref 8.7–10.3)
Chloride: 102 mmol/L (ref 96–106)
Creatinine, Ser: 1.02 mg/dL — ABNORMAL HIGH (ref 0.57–1.00)
GFR calc Af Amer: 62 mL/min/{1.73_m2} (ref >59)
GFR calc non Af Amer: 54 mL/min/{1.73_m2} — ABNORMAL LOW (ref >59)
Glucose: 111 mg/dL — ABNORMAL HIGH (ref 65–99)
Potassium: 4 mmol/L (ref 3.5–5.2)
Sodium: 140 mmol/L (ref 134–144)

## 2020-06-14 NOTE — Telephone Encounter (Signed)
Per avs Patient to follow up with Dr. [name] in 3 months.   Please call patient or assist with forwarding to scheduling for [facility]

## 2020-06-14 NOTE — Telephone Encounter (Signed)
-----   Message from Theora Gianotti, NP sent at 06/14/2020 11:10 AM EST ----- Renal fxn, lytes stable.

## 2020-06-14 NOTE — Telephone Encounter (Signed)
Spoke to pt. The patient has been notified of the result and verbalized understanding.  All questions (if any) were answered.

## 2020-06-19 NOTE — Telephone Encounter (Signed)
appt scheduled

## 2020-06-25 ENCOUNTER — Inpatient Hospital Stay: Payer: Medicare HMO

## 2020-06-25 ENCOUNTER — Inpatient Hospital Stay (HOSPITAL_BASED_OUTPATIENT_CLINIC_OR_DEPARTMENT_OTHER): Payer: Medicare HMO | Admitting: Nurse Practitioner

## 2020-06-25 ENCOUNTER — Encounter: Payer: Self-pay | Admitting: Nurse Practitioner

## 2020-06-25 ENCOUNTER — Inpatient Hospital Stay: Payer: Medicare HMO | Attending: Internal Medicine

## 2020-06-25 VITALS — BP 140/67 | HR 59 | Temp 98.0°F | Resp 18

## 2020-06-25 VITALS — BP 133/74 | HR 60 | Temp 97.0°F | Resp 16 | Ht 60.0 in | Wt 187.0 lb

## 2020-06-25 DIAGNOSIS — Z7901 Long term (current) use of anticoagulants: Secondary | ICD-10-CM | POA: Diagnosis not present

## 2020-06-25 DIAGNOSIS — E1122 Type 2 diabetes mellitus with diabetic chronic kidney disease: Secondary | ICD-10-CM | POA: Diagnosis not present

## 2020-06-25 DIAGNOSIS — N183 Chronic kidney disease, stage 3 unspecified: Secondary | ICD-10-CM | POA: Insufficient documentation

## 2020-06-25 DIAGNOSIS — Z87891 Personal history of nicotine dependence: Secondary | ICD-10-CM | POA: Insufficient documentation

## 2020-06-25 DIAGNOSIS — D509 Iron deficiency anemia, unspecified: Secondary | ICD-10-CM | POA: Diagnosis not present

## 2020-06-25 DIAGNOSIS — N83201 Unspecified ovarian cyst, right side: Secondary | ICD-10-CM | POA: Diagnosis not present

## 2020-06-25 DIAGNOSIS — E611 Iron deficiency: Secondary | ICD-10-CM

## 2020-06-25 DIAGNOSIS — E538 Deficiency of other specified B group vitamins: Secondary | ICD-10-CM

## 2020-06-25 DIAGNOSIS — I428 Other cardiomyopathies: Secondary | ICD-10-CM | POA: Diagnosis not present

## 2020-06-25 DIAGNOSIS — I2721 Secondary pulmonary arterial hypertension: Secondary | ICD-10-CM | POA: Insufficient documentation

## 2020-06-25 DIAGNOSIS — Z8673 Personal history of transient ischemic attack (TIA), and cerebral infarction without residual deficits: Secondary | ICD-10-CM | POA: Diagnosis not present

## 2020-06-25 DIAGNOSIS — R001 Bradycardia, unspecified: Secondary | ICD-10-CM | POA: Insufficient documentation

## 2020-06-25 DIAGNOSIS — I130 Hypertensive heart and chronic kidney disease with heart failure and stage 1 through stage 4 chronic kidney disease, or unspecified chronic kidney disease: Secondary | ICD-10-CM | POA: Insufficient documentation

## 2020-06-25 DIAGNOSIS — E1140 Type 2 diabetes mellitus with diabetic neuropathy, unspecified: Secondary | ICD-10-CM | POA: Diagnosis not present

## 2020-06-25 DIAGNOSIS — N83202 Unspecified ovarian cyst, left side: Secondary | ICD-10-CM | POA: Insufficient documentation

## 2020-06-25 DIAGNOSIS — I4819 Other persistent atrial fibrillation: Secondary | ICD-10-CM | POA: Diagnosis not present

## 2020-06-25 DIAGNOSIS — J449 Chronic obstructive pulmonary disease, unspecified: Secondary | ICD-10-CM | POA: Diagnosis not present

## 2020-06-25 DIAGNOSIS — I509 Heart failure, unspecified: Secondary | ICD-10-CM | POA: Insufficient documentation

## 2020-06-25 DIAGNOSIS — E785 Hyperlipidemia, unspecified: Secondary | ICD-10-CM | POA: Diagnosis not present

## 2020-06-25 DIAGNOSIS — Z79899 Other long term (current) drug therapy: Secondary | ICD-10-CM | POA: Diagnosis not present

## 2020-06-25 LAB — IRON AND TIBC
Iron: 53 ug/dL (ref 28–170)
Saturation Ratios: 14 % (ref 10.4–31.8)
TIBC: 389 ug/dL (ref 250–450)
UIBC: 336 ug/dL

## 2020-06-25 LAB — CBC WITH DIFFERENTIAL/PLATELET
Abs Immature Granulocytes: 0.01 10*3/uL (ref 0.00–0.07)
Basophils Absolute: 0 10*3/uL (ref 0.0–0.1)
Basophils Relative: 1 %
Eosinophils Absolute: 0.2 10*3/uL (ref 0.0–0.5)
Eosinophils Relative: 4 %
HCT: 31.7 % — ABNORMAL LOW (ref 36.0–46.0)
Hemoglobin: 9.8 g/dL — ABNORMAL LOW (ref 12.0–15.0)
Immature Granulocytes: 0 %
Lymphocytes Relative: 29 %
Lymphs Abs: 1.5 10*3/uL (ref 0.7–4.0)
MCH: 27.8 pg (ref 26.0–34.0)
MCHC: 30.9 g/dL (ref 30.0–36.0)
MCV: 89.8 fL (ref 80.0–100.0)
Monocytes Absolute: 0.3 10*3/uL (ref 0.1–1.0)
Monocytes Relative: 6 %
Neutro Abs: 3.1 10*3/uL (ref 1.7–7.7)
Neutrophils Relative %: 60 %
Platelets: 182 10*3/uL (ref 150–400)
RBC: 3.53 MIL/uL — ABNORMAL LOW (ref 3.87–5.11)
RDW: 12.5 % (ref 11.5–15.5)
WBC: 5.1 10*3/uL (ref 4.0–10.5)
nRBC: 0 % (ref 0.0–0.2)

## 2020-06-25 LAB — BASIC METABOLIC PANEL
Anion gap: 9 (ref 5–15)
BUN: 23 mg/dL (ref 8–23)
CO2: 30 mmol/L (ref 22–32)
Calcium: 8.7 mg/dL — ABNORMAL LOW (ref 8.9–10.3)
Chloride: 101 mmol/L (ref 98–111)
Creatinine, Ser: 0.87 mg/dL (ref 0.44–1.00)
GFR, Estimated: >60 mL/min (ref >60)
Glucose, Bld: 96 mg/dL (ref 70–99)
Potassium: 4.1 mmol/L (ref 3.5–5.1)
Sodium: 140 mmol/L (ref 135–145)

## 2020-06-25 LAB — FERRITIN: Ferritin: 202 ng/mL (ref 11–307)

## 2020-06-25 MED ORDER — SODIUM CHLORIDE 0.9 % IV SOLN
Freq: Once | INTRAVENOUS | Status: AC
  Filled 2020-06-25: qty 250

## 2020-06-25 MED ORDER — CYANOCOBALAMIN 1000 MCG/ML IJ SOLN
1000.0000 ug | Freq: Once | INTRAMUSCULAR | Status: AC
  Administered 2020-06-25: 1000 ug via INTRAMUSCULAR
  Filled 2020-06-25: qty 1

## 2020-06-25 MED ORDER — IRON SUCROSE 20 MG/ML IV SOLN
200.0000 mg | Freq: Once | INTRAVENOUS | Status: AC
  Administered 2020-06-25: 200 mg via INTRAVENOUS
  Filled 2020-06-25: qty 10

## 2020-06-25 NOTE — Progress Notes (Signed)
[facility] Interval Note  Patient Care Team: [name], MD as PCP - General (Family Medicine) Wellington Hampshire, MD as PCP - Cardiology (Cardiology) Deboraha Sprang, MD as PCP - Electrophysiology (Cardiology) Thom Chimes, MD as Referring Physician (Allergy) Juanito Doom, MD as Referring Physician (Internal Medicine) De Hollingshead, RPH-CPP as Pharmacist (Pharmacist) Cammie Sickle, MD as Consulting Physician (Hematology and Oncology) Jon Billings, RN as Jet Management Anemia  CHIEF COMPLAINTS/PURPOSE OF CONSULTATION: Anemia   HEMATOLOGY HISTORY  # CHORNIC IRON DEFICIENCY ANEMIA [BL 10-11]-Jan today 21 PCP Hb-8.8; Antonieta Pert sat-5%; ferritin 11] EGD-; colonoscopy MARCH 2020 -polyp [[facility]-hold further work-up secondary to cardiac issues]; remote-EGD-?;  February 2021-LDH/haptoglobin normal; kappa lambda light chain ratio/M panel normal;  on Venofer; November 2020-CT scan question early cirrhosis/no splenomegaly.  No acute process; MARCH 2021- STOOL OCCULT x3- NEGATIVE.   #B12 deficiency-IM every 3 to 4 months  #2014 -question blood transfusion  [patient admitted for stroke]; CHF chronic -[ 2004 on 4 lits; Dr.Arida/Klein]; CT scan chest 2021 -no adenopathy; fibrotic changes; walker; CKD stage III-   #Incidental right axillary mass-s/p US biopsy [PCP] -negative for malignancy.  HISTORY OF PRESENTING ILLNESS: Debra Saunders 75 y.o. female with anemia, likely secondary to stage III CKD, returns to clinic for follow up and consideration of venofer. She feels tired, low energy. Not sleeping well and appetite is reduced. Weight is stable. Chronic shortness of breath that is not improved with iron infusions. Does feel her fatigue improves with infusion. No black or bloody stools. Continues home oxygen.   Review of Systems  Constitutional: Positive for malaise/fatigue. Negative for chills, diaphoresis, fever and weight loss.   HENT: Negative for nosebleeds and sore throat.   Eyes: Negative for double vision.  Respiratory: Positive for shortness of breath. Negative for cough, hemoptysis, sputum production and wheezing.   Cardiovascular: Negative for chest pain, palpitations, orthopnea and leg swelling.  Gastrointestinal: Positive for diarrhea. Negative for abdominal pain, blood in stool, constipation, heartburn, melena, nausea and vomiting.  Genitourinary: Negative for dysuria, frequency and urgency.  Musculoskeletal: Negative for back pain and joint pain.  Skin: Negative.  Negative for itching and rash.  Neurological: Negative for dizziness, tingling, focal weakness, weakness and headaches.  Endo/Heme/Allergies: Does not bruise/bleed easily.  Psychiatric/Behavioral: Negative for depression. The patient is not nervous/anxious and does not have insomnia.     MEDICAL HISTORY:  Past Medical History:  Diagnosis Date  . Arthritis   . Atrial fibrillation, persistent ([place])    a. s/p TEE-guided DCCV on 08/20/2017  . Cardiorenal syndrome    a. 09/2017 Creat rose to 2.7 w/ diuresis-->HD x 2.  . Chest pain    a. 01/2012 Cath: nonobs dzs;  c. 04/2014 MV: No ischemia.  . Chronic back pain   . Chronic combined systolic (congestive) and diastolic (congestive) heart failure ([place])    a. Dx 2005 @ [facility];  b. 02/2014 Echo: EF 55-60%; c. 07/2017: EF 30-35%, diffuse HK, trivial AI, severe MR, moderate TR; d. 09/2017 Echo: EF 40-45%; e. 02/2018 Echo: EF 55-60%, Gr1 DD; f. 08/2019 Echo: EF 60-65%, Gr2 DD, Nl RV fxn, RVSP 49.34mmHg. Mod-Sev MR. Mild to mod TR. Mod PR. RA ~ 68mmHg.  [name] Chronic respiratory failure (HCC)    a. on home O2  . COPD (chronic obstructive pulmonary disease) ([place])    a. Home O2 - 3lpm  . Gastritis   . History of intracranial hemorrhage    a. 6/14  described by neurosurgery as "small frontal posttraumatic SAH " - pt was on xarelto @ the time.  . Hyperlipidemia   . Hypertension   . Interstitial lung disease ([place])    . Lipoma of back   . Lymphedema    a. Chronic LLE edema.  [name] NICM (nonischemic cardiomyopathy) ([place])    a. 09/2017 Echo: EF 40-45%; b. 09/2017 Cath: minor, insignificant CAD; c. 02/2018 Echo: EF 55-60%, Gr1 DD; d. 08/2019 Echo: EF 60-65%, Gr2 DD.  [name] Obesity   . PAH (pulmonary artery hypertension) ([place])    a. 09/2017 Echo: PASP 1mmHg; b. 09/2017 RHC: RA 23, RV 84/13, PA 84/29, PCWP 20, CO 3.89, CI 1.89. LVEDP 18.  . Sepsis with metabolic encephalopathy ([place])   . Sleep apnea   . Streptococcal bacteremia   . Stroke ([place])   . Subarachnoid hemorrhage ([place]) 01/19/2013  . Symptomatic bradycardia    a. s/p BSX PPM.  . Thyroid nodule   . Type II diabetes mellitus ([place])   . Urinary incontinence     SURGICAL HISTORY: Past Surgical History:  Procedure Laterality Date  . ABDOMINAL HYSTERECTOMY  1969  . BREAST BIOPSY Right 10/28/2019   lymph node bx, [place] Bx, pending path   . CARDIAC CATHETERIZATION  2013   @ [place]: No obstructive CAD: Only 20% ostial left circumflex.  [name] CARDIOVERSION N/A 10/12/2017   Procedure: CARDIOVERSION;  Surgeon: Minna Merritts, MD;  Location: [facility] ORS;  Service: Cardiovascular;  Laterality: N/A;  . CHOLECYSTECTOMY    . COLONOSCOPY WITH PROPOFOL N/A 09/10/2018   Procedure: COLONOSCOPY WITH PROPOFOL;  Surgeon: Manya Silvas, MD;  Location: [facility] ENDOSCOPY;  Service: Endoscopy;  Laterality: N/A;  . CYST REMOVAL TRUNK  2002   BACK  . DIALYSIS/PERMA CATHETER INSERTION N/A 10/06/2017   Procedure: DIALYSIS/PERMA CATHETER INSERTION;  Surgeon: Katha Cabal, MD;  Location: [place] CV LAB;  Service: Cardiovascular;  Laterality: N/A;  . INSERT / REPLACE / REMOVE PACEMAKER    . lipoma removal  2014   back  . PACEMAKER INSERTION  [address] [employer] dual chamber pacemaker implanted by Dr Caryl Comes for symptomatic bradycardia  . PERMANENT PACEMAKER INSERTION N/A 01/09/2014   Procedure: PERMANENT PACEMAKER INSERTION;  Surgeon: Deboraha Sprang, MD;  Location: [facility] CATH LAB;   Service: Cardiovascular;  Laterality: N/A;  . RIGHT HEART CATH N/A 04/19/2018   Procedure: RIGHT HEART CATH;  Surgeon: Larey Dresser, MD;  Location: [place] CV LAB;  Service: Cardiovascular;  Laterality: N/A;  . RIGHT/LEFT HEART CATH AND CORONARY ANGIOGRAPHY N/A 10/19/2017   Procedure: RIGHT/LEFT HEART CATH AND CORONARY ANGIOGRAPHY;  Surgeon: Wellington Hampshire, MD;  Location: [place] CV LAB;  Service: Cardiovascular;  Laterality: N/A;  . TEE WITHOUT CARDIOVERSION N/A 08/20/2017   Procedure: TRANSESOPHAGEAL ECHOCARDIOGRAM (TEE) & Direct current cardioversion;  Surgeon: Minna Merritts, MD;  Location: [facility] ORS;  Service: Cardiovascular;  Laterality: N/A;  . TRIGGER FINGER RELEASE      SOCIAL HISTORY: Social History   Socioeconomic History  . Marital status: Divorced    Spouse name: Not on file  . Number of children: 4  . Years of education: 64  . Highest education level: High school graduate  Occupational History  . Occupation: Retired- factory work    Comment: exposed to Pharmacist, community  Tobacco Use  . Smoking status: Former Smoker    Packs/day: 0.30    Years: 2.00    Pack years: 0.60    Types: Cigarettes  Quit date: 06/23/1990    Years since quitting: 30.0  . Smokeless tobacco: Never Used  Vaping Use  . Vaping Use: Never used  Substance and Sexual Activity  . Alcohol use: No  . Drug use: No  . Sexual activity: Not Currently  Other Topics Concern  . Not on file  Social History Narrative   Lives in [place] with daughter. Has 4 children. No pets.      Work - Restaurant manager, fast food, retired      Diet - regular      02/14/2019: reports has Meals On Wheels delivered to her home;       Remote hx of smoking > 30 years; No alcohol-    Social Determinants of Health   Financial Resource Strain: Low Risk   . Difficulty of Paying Living Expenses: Not hard at all  Food Insecurity: No Food Insecurity  . Worried About Charity fundraiser in the Last Year: Never true  . Ran  Out of Food in the Last Year: Never true  Transportation Needs: Unmet Transportation Needs  . Lack of Transportation (Medical): Yes  . Lack of Transportation (Non-Medical): No  Physical Activity: Not on file  Stress: Not on file  Social Connections: Not on file  Intimate Partner Violence: Not on file   Immunization History  Administered Date(s) Administered  . Fluad Quad(high Dose 65+) 07/08/2019, 04/10/2020  . Influenza, High Dose Seasonal PF 07/23/2016, 06/15/2017, 07/02/2018  . Influenza, Seasonal, Injecte, Preservative Fre 07/27/2012  . Influenza,inj,Quad PF,6+ Mos 06/10/2013, 05/01/2014, 03/19/2015  . PFIZER SARS-COV-2 Vaccination 09/23/2019, 10/17/2019  . Pneumococcal Conjugate-13 10/06/2013  . Pneumococcal Polysaccharide-23 04/28/2012  . Td 08/03/2013    FAMILY HISTORY: Family History  Problem Relation Age of Onset  . Heart disease Mother   . Hypertension Mother   . COPD Mother        was a smoker  . Thyroid disease Mother   . Hypertension Father   . Hypertension Sister   . Thyroid disease Sister   . Breast cancer Sister   . Hypertension Sister   . Thyroid disease Sister   . Bone cancer Sister   . Breast cancer Maternal Aunt   . Kidney disease Neg Hx   . Bladder Cancer Neg Hx     ALLERGIES:  is allergic to aspirin, other, and penicillins.  MEDICATIONS:  Current Outpatient Medications  Medication Sig Dispense Refill  . acetaminophen (TYLENOL) 325 MG tablet Take 325-650 mg by mouth every 6 (six) hours as needed for mild pain. Reported on 10/10/2015    . albuterol (PROVENTIL) (2.5 MG/3ML) 0.083% nebulizer solution Take 3 mLs (2.5 mg total) by nebulization every 6 (six) hours. 75 mL 12  . apixaban (ELIQUIS) 5 MG TABS tablet Take 1 tablet (5 mg total) by mouth 2 (two) times daily. Needs appt for future refills 60 tablet 0  . cholestyramine (QUESTRAN) 4 g packet Take 4 g by mouth daily.     . digoxin (LANOXIN) 0.125 MG tablet Take 1 tablet by mouth once daily 90  tablet 3  . gabapentin (NEURONTIN) 100 MG capsule TAKE 2 CAPSULES BY MOUTH THREE TIMES DAILY 180 capsule 0  . glucose blood test strip Use with True Metrix glucometer to check sugars BID 200 each 3  . Lancets MISC Use with True Metrix glucometer to check sugars BID 200 each 3  . macitentan (OPSUMIT) 10 MG tablet Take 1 tablet (10 mg total) by mouth daily. 30 tablet 11  . meclizine (ANTIVERT) 25  MG tablet Take 1 tablet (25 mg total) by mouth 3 (three) times daily as needed for dizziness. 30 tablet 0  . Menthol, Topical Analgesic, (BIOFREEZE EX) Apply 1 application topically as needed (for pain).     . ondansetron (ZOFRAN) 8 MG tablet Take 1 tablet (8 mg total) by mouth every 8 (eight) hours as needed for nausea or vomiting. 30 tablet 2  . OXYGEN Inhale 4 L into the lungs continuous.    . potassium chloride SA (KLOR-CON) 20 MEQ tablet Take 1 tablet (20 mEq total) by mouth daily. 90 tablet 0  . rosuvastatin (CRESTOR) 10 MG tablet Take 1 tablet (10 mg total) by mouth daily. 90 tablet 3  . Selexipag (UPTRAVI) 1600 MCG TABS Take 1,600 mcg by mouth 2 (two) times daily.    . sildenafil (REVATIO) 20 MG tablet Take 1 tablet (20 mg total) by mouth 3 (three) times daily. 270 tablet 3  . spironolactone (ALDACTONE) 25 MG tablet Take 1 tablet (25 mg total) by mouth at bedtime. Please call for a office visit [phone] 90 tablet 2  . torsemide (DEMADEX) 20 MG tablet Take 2 tablets (40 mg total) by mouth 2 (two) times daily. 180 tablet 3   No current facility-administered medications for this visit.    PHYSICAL EXAMINATION: Vitals:   06/25/20 1406  BP: 133/74  Pulse: 60  Resp: 16  Temp: (!) 97 F (36.1 C)  SpO2: 100%   Filed Weights   06/25/20 1406  Weight: 187 lb (84.8 kg)    Physical Exam Constitutional:      Comments: Patient in wheelchair.  Alone.  She is on O2 nasal cannula.  HENT:     Head: Normocephalic and atraumatic.     Mouth/Throat:     Pharynx: No oropharyngeal exudate.  Eyes:      Pupils: Pupils are equal, round, and reactive to light.  Cardiovascular:     Rate and Rhythm: Normal rate and regular rhythm.  Pulmonary:     Effort: No respiratory distress.     Breath sounds: No wheezing.  Abdominal:     General: Bowel sounds are normal. There is no distension.     Palpations: Abdomen is soft. There is no mass.     Tenderness: There is no abdominal tenderness. There is no guarding or rebound.  Musculoskeletal:        General: Swelling present. No tenderness. Normal range of motion.     Cervical back: Normal range of motion and neck supple.  Skin:    General: Skin is warm.  Neurological:     Mental Status: She is alert and oriented to person, place, and time.  Psychiatric:        Mood and Affect: Affect normal.     LABORATORY DATA:  I have reviewed the data as listed Lab Results  Component Value Date   WBC 5.1 06/25/2020   HGB 9.8 (L) 06/25/2020   HCT 31.7 (L) 06/25/2020   MCV 89.8 06/25/2020   PLT 182 06/25/2020   Recent Labs    08/01/19 1524 11/29/19 1244 01/30/20 1127 03/26/20 1317 04/10/20 1354 04/20/20 1435 06/13/20 1621 06/25/20 1334  NA 137   < > 139 138 139  --  140 140  K 3.8   < > 3.8 3.7 3.8  --  4.0 4.1  CL 99   < > 103 104 102  --  102 101  CO2 28   < > 26 27 31   --  25 30  GLUCOSE 94   < > 97 113* 99  --  111* 96  BUN 24*   < > 26* 26* 30*  --  23 23  CREATININE 1.16*   < > 1.13* 0.89 1.08  --  1.02* 0.87  CALCIUM 9.3   < > 8.6* 8.4* 8.9  --  8.8 8.7*  GFRNONAA 46*   < > 47* >60  --   --  54* >60  GFRAA 53*   < > 55* >60  --   --  62  --   PROT 8.1  --   --  7.3 6.9  --   --   --   ALBUMIN 4.0  --   --  3.6 4.0  --   --   --   AST 15  --   --  16 13  --   --   --   ALT 10  --   --  19 7  --   --   --   ALKPHOS 124  --   --  137* 133* 163*  --   --   BILITOT 0.5  --   --  0.6 0.4  --   --   --    < > = values in this interval not displayed.     CUP PACEART REMOTE DEVICE CHECK  Result Date: 05/29/2020 Scheduled remote  reviewed. Normal device function.  AF burden < 1%, longest < 1 min Next remote 91 days- JBox, RN/CVRS  Assessment & Plan  1. Chronic Anemia- baseline hemoglobin 10-11. Etiology likely secondary to iron deficiency and CKD stage III- question chronic diarrhea and malabsorption.  (today GFR > 60). Currently s/p Venofer. Hemoglobin 9.8. Proceed with venofer today. Iron studies pending at time of visit. No evidence of iron deficiency. Suspect anemia of chronic disease most contributory. No additional iron.   2. B12 deficiency- borderline low b12 previously. Symptomatically improved with b12 injections. Proceed with b12 today and monthly.   3. CKD - stage III. GFR improved. Hold retacrit.   4. History of DVT & a Fib- s/p IVC filter, on Eliquis. Continue to monitor.   DISPOSITION:   Proceed with venofer & b12 today 1 month- b12 injection 2 months- labs (cbc, bmp, ferritin, iron studies, b12), Dr. Jacinto Reap, b12 and poss venofer  No problem-specific Assessment & Plan notes found for this encounter.  All questions were answered. The patient knows to call the clinic with any problems, questions or concerns.   Beckey Rutter, DNP, AGNP-C [place] at [facility] [phone] (clinic)

## 2020-06-25 NOTE — Progress Notes (Signed)
Tolerated Venfor well. Discharged in stable condition. Escorted out via wheelchair with home O2.

## 2020-06-25 NOTE — Progress Notes (Signed)
Expresses tiredness and low energy. Sleep patterns are not consistent. States she has not appetite.

## 2020-06-27 ENCOUNTER — Other Ambulatory Visit (HOSPITAL_COMMUNITY): Payer: Self-pay | Admitting: Cardiology

## 2020-06-27 ENCOUNTER — Other Ambulatory Visit (HOSPITAL_COMMUNITY): Payer: Self-pay

## 2020-06-27 MED ORDER — APIXABAN 5 MG PO TABS: 5.0000 mg | ORAL_TABLET | Freq: Two times a day (BID) | ORAL | 0 refills | Status: DC

## 2020-06-27 MED ORDER — POTASSIUM CHLORIDE CRYS ER 20 MEQ PO TBCR: 20.0000 meq | EXTENDED_RELEASE_TABLET | Freq: Every day | ORAL | 0 refills | Status: DC

## 2020-06-27 NOTE — Progress Notes (Signed)
Today had a home visit with Debra Saunders.  She states tired today, she did not sleep well last night.  She has not ate this morning.  She has taken her medications.  Discussed with her importance of eating when take her medications.  She has all her medications.  She appears to be taking them all.  Called in refills for potassium and eliquis.  Discussed cost this time of the year and she states has a hard time.  Discussed with social worker Debra Saunders about some [employer] gift cards to help out with some of the cost of her medications.  She gets her prescriptions at [facility].  Debra Saunders states she will call Debra Saunders tomorrow to see how she could help.  She denies any pain today such as chest pain, headaches.  She states she has some light headiness, not sure because she has not ate or she states could be lack of sleep.  She was alert and we talked for awhile about the holidays.  She states she watches her diet and fluids.  She does has some days of snacking on wrong things.  She is aware of up coming appts.  She ahs family support.  Will continue to visit for heart failure, diet and medication compliant.    [place] [phone]

## 2020-06-28 ENCOUNTER — Encounter (HOSPITAL_COMMUNITY): Payer: Self-pay

## 2020-06-28 ENCOUNTER — Telehealth (HOSPITAL_COMMUNITY): Payer: Self-pay | Admitting: Licensed Clinical Social Worker

## 2020-06-28 NOTE — Telephone Encounter (Signed)
CSW informed by [employer] Paramedic that pt has been struggling with medication costs.  CSW called pt to discuss potential assistance options- unable to reach- left message with pt dtr to reach out to me when available.  Will continue to follow and assist as needed  Jorge Ny, [facility] Desk#: [phone] Cell#: [phone]

## 2020-06-29 ENCOUNTER — Telehealth (HOSPITAL_COMMUNITY): Payer: Self-pay | Admitting: Licensed Clinical Social Worker

## 2020-06-29 NOTE — Telephone Encounter (Signed)
CSW called pt regarding medication cost concerns.  Pt struggling to pay for meds at this time.  CSW able to send out patient assistance apps to pt for eliquis and uptravi as well as enroll pt in [employer] waitlist for uptravi.  CSW also sent pt $50 in [employer] giftcards to assist with next fill.  Will continue to follow and assist as needed  Jorge Ny, [facility] Desk#: [phone] Cell#: [phone]

## 2020-07-05 ENCOUNTER — Other Ambulatory Visit: Payer: Self-pay

## 2020-07-05 NOTE — Patient Outreach (Signed)
[facility]) Care Management  07/05/2020  KEONNA RAETHER 03-22-1946 [id number]   Telephone call to patient for disease management follow up.   No answer.  HIPAA compliant voice message left.    Plan: If no return call, RN CM will attempt patient again in the month of March.  [name], RN, MSN [place] Management Care Management Coordinator Direct Line [phone] Cell [phone] Toll Free: [phone]  Fax: [phone]

## 2020-07-11 ENCOUNTER — Ambulatory Visit: Payer: Medicare HMO | Admitting: Family Medicine

## 2020-07-11 ENCOUNTER — Telehealth (HOSPITAL_COMMUNITY): Payer: Self-pay

## 2020-07-11 ENCOUNTER — Telehealth: Payer: Self-pay | Admitting: Pharmacist

## 2020-07-11 NOTE — Telephone Encounter (Signed)
She needs to be tested for COVID and flu. Please call her and triage. She may need to be seen in person for the shortness of breath.

## 2020-07-11 NOTE — Telephone Encounter (Signed)
Please attempt to call the patient again this afternoon.  Thanks.

## 2020-07-11 NOTE — Telephone Encounter (Signed)
Had spoke to [facility] yesterday and she advised was sick with fever, sore throat and weakness.  Caontacted her today to check on her and she states she is still sick, fever, sorethroat that feels like it is going to swell close, body aches and today has increased shortness of breath.  She states daughter is unable to get her a covid test.  Contacted [name] and receptionist at Dr [name] office to make aware.  Contacted [name] Daughter [facility] and made her aware also. Debra Saunders states she can schedule her a covid test with her son tomorrow at health department drive thru or take her to Dr [name] office. Advised her that Doctor office will be getting in touch with her to let her know what they can do, they may only be able to do virtual appts this week due to weather.  Will follow up with [name].   [name] [phone]

## 2020-07-11 NOTE — Telephone Encounter (Signed)
Yes I informed the patient's daughter of that and she understood.  Debra Saunders,cma

## 2020-07-11 NOTE — Telephone Encounter (Signed)
[name] just called, reported patient is having symptoms of shortness of break, sore throat, fever, and body aches, but reported to [place] that her daughter "couldn't not find any testing locations".   Routing to PCP and CMA to advise on possible options and to reiterate [name]'s recommendation to pursue testing

## 2020-07-11 NOTE — Telephone Encounter (Signed)
I called to triage the patient and LVM for them to call back.  Amin Fornwalt,cma

## 2020-07-11 NOTE — Telephone Encounter (Signed)
I called and spoke with her daughter and she is getting covid tested tommorow and I talked her into taking the patient to [facility] urgetn care for evaluation, they are going in the morning becaseu she is at work and can't get her there by 7 pm she gets off at 6 pm.   Faith Patricelli,cma

## 2020-07-11 NOTE — Telephone Encounter (Signed)
Noted. If she has any worsening breathing issues or chest pain she should be evaluated in the ED today.

## 2020-07-12 ENCOUNTER — Other Ambulatory Visit: Payer: Self-pay

## 2020-07-12 ENCOUNTER — Encounter: Payer: Self-pay | Admitting: *Deleted

## 2020-07-12 ENCOUNTER — Emergency Department: Payer: Medicare HMO

## 2020-07-12 ENCOUNTER — Telehealth: Payer: Self-pay | Admitting: Emergency Medicine

## 2020-07-12 ENCOUNTER — Encounter (HOSPITAL_COMMUNITY): Payer: Self-pay

## 2020-07-12 ENCOUNTER — Telehealth: Payer: Self-pay

## 2020-07-12 ENCOUNTER — Emergency Department
Admission: EM | Admit: 2020-07-12 | Discharge: 2020-07-12 | Disposition: A | Discharge status: Home / Self Care | Payer: Medicare HMO | Attending: Emergency Medicine | Admitting: Emergency Medicine

## 2020-07-12 DIAGNOSIS — Z5321 Procedure and treatment not carried out due to patient leaving prior to being seen by health care provider: Secondary | ICD-10-CM | POA: Insufficient documentation

## 2020-07-12 DIAGNOSIS — R197 Diarrhea, unspecified: Secondary | ICD-10-CM | POA: Diagnosis not present

## 2020-07-12 DIAGNOSIS — I700 Atherosclerosis of aorta: Secondary | ICD-10-CM | POA: Diagnosis not present

## 2020-07-12 DIAGNOSIS — I4891 Unspecified atrial fibrillation: Secondary | ICD-10-CM | POA: Insufficient documentation

## 2020-07-12 DIAGNOSIS — R079 Chest pain, unspecified: Secondary | ICD-10-CM | POA: Diagnosis not present

## 2020-07-12 DIAGNOSIS — R110 Nausea: Secondary | ICD-10-CM | POA: Insufficient documentation

## 2020-07-12 LAB — CBC
HCT: 31.4 % — ABNORMAL LOW (ref 36.0–46.0)
Hemoglobin: 9.5 g/dL — ABNORMAL LOW (ref 12.0–15.0)
MCH: 27.5 pg (ref 26.0–34.0)
MCHC: 30.3 g/dL (ref 30.0–36.0)
MCV: 91 fL (ref 80.0–100.0)
Platelets: 188 10*3/uL (ref 150–400)
RBC: 3.45 MIL/uL — ABNORMAL LOW (ref 3.87–5.11)
RDW: 12.6 % (ref 11.5–15.5)
WBC: 6.9 10*3/uL (ref 4.0–10.5)
nRBC: 0 % (ref 0.0–0.2)

## 2020-07-12 LAB — BASIC METABOLIC PANEL
Anion gap: 10 (ref 5–15)
BUN: 28 mg/dL — ABNORMAL HIGH (ref 8–23)
CO2: 30 mmol/L (ref 22–32)
Calcium: 8.7 mg/dL — ABNORMAL LOW (ref 8.9–10.3)
Chloride: 101 mmol/L (ref 98–111)
Creatinine, Ser: 1.07 mg/dL — ABNORMAL HIGH (ref 0.44–1.00)
GFR, Estimated: 53 mL/min — ABNORMAL LOW (ref >60)
Glucose, Bld: 131 mg/dL — ABNORMAL HIGH (ref 70–99)
Potassium: 4 mmol/L (ref 3.5–5.1)
Sodium: 141 mmol/L (ref 135–145)

## 2020-07-12 LAB — TROPONIN I (HIGH SENSITIVITY): Troponin I (High Sensitivity): 17 ng/L (ref <18)

## 2020-07-12 LAB — PROTIME-INR
INR: 1.1 (ref 0.8–1.2)
Prothrombin Time: 13.7 seconds (ref 11.4–15.2)

## 2020-07-12 NOTE — Telephone Encounter (Signed)
Received alert for AF in progress since 07/09/20. Hx AF, + Eliquis,  Digoxin 0.125mg  qd. Patient reports she has had a fever, cough, nasal congestion, runny nose x 5 days. Patient had a Covid test today and her PCP recommended she be seen at an urgent care center. No CP, no chest pressure , dizziness, or syncope. She reports she ," feels awful". Advised patient to be seen in urgent care and ED precautions given.

## 2020-07-12 NOTE — ED Triage Notes (Signed)
First Nurse Note:  Arrives for ED evaluation of afib.  Per patient, patient has a heart monitor on and rhythm has been going on x 3 days.  AAOx3.  Skin warm and dry. NAD

## 2020-07-12 NOTE — ED Triage Notes (Signed)
Pt to triage via wheelchair.  Pt reports sob for 1 week, worse today.  Denies chest pain .  Pt also states she is in afib x 3 days.  Pt on 4 liters oxygen at home hx chf.  Pt alert  Speech clear.

## 2020-07-12 NOTE — Telephone Encounter (Signed)
Incoming call received from [place], [place].  [name] sts that she called the patient after reviewing the telephone note from our device clinic about the patient being in AFIB since 07/09/20.  [name] sts that the patient is having COVID like symptoms as well and has a COVID test pending. [name] ask if the patient could be seen in our office this afternoon. Adv her that we do not have provider availability. Adv her that I agree with Jodi Geralds, RN recommendation. The patient needs to be seen in a urgent care or the ED.  Spoke with [name], NP who recommended the same.  Contacted the patients daughter [name]. Advised her of Chris's recommendation. [name] sts that one of the patients other daughters is taking the patient to [facility] urgent care now. The patient refuses to go to the ED.  Spoke with [name] and provided the update. Adv her for future reference we would see patients with COVID or COVID like symptoms in the office if there was concern about their cardiac status. This would need to be reviewed by a provider  and the decision made to see the patient in office would be made by them. [name] verbalized understanding and voiced appreciation for the call.

## 2020-07-13 NOTE — Telephone Encounter (Signed)
Contacted patient by phone. Patient not available. Spoke with patient's daughter who is listed as her DPR in reference to Mrs. Debra Saunders (patient) sending a remote transmission on Monday 07/16/2020. Dr. Caryl Comes concerned that patient may be in possible A-fib. If transmission shows A-fib on Monday, patient will be referred to the A-fib clinic per Dr. Caryl Comes. Device number given to [facility] [phone] an advised that patient needs to send this transmission first thing on Monday. If patient has any issues, the Device Clinic will assist with the transmission over the phone.  Patient contacted the [facility] by phone returning call. Patient advised that her home phone is broken. Patient given same information as stated above with the [facility] number [phone]. Patient verbalized understanding on sending the remote transmission on Monday morning and, if any issues she would call the [facility]. Patient also stated she went to the Urgent care last night who refereed her to the ED. Patient states an EKG and X-rays were taken. Patient states she left the ED due to wait times were around 10 hours. Patient sates she feels that she has a cold but no fevers today. Patient advised by Device RN if any shortness of breath, chest pain, or any other urgent/emergent issues to seek care at the emergency room or to call EMS. Patient verbalized understanding.

## 2020-07-13 NOTE — Telephone Encounter (Signed)
Ladies  can [employer] an interrogation on Monday to see if still in Afib and then set her up to go to AFib/ RU-AT for cardioversion\ Thanks SK

## 2020-07-16 NOTE — Telephone Encounter (Signed)
Spoke with patient and patient advised that she just sent a remote transmission a few minutes ago. Advised patient that remote has not went through yet and that we will be checking for the transmission.

## 2020-07-16 NOTE — Telephone Encounter (Signed)
Patient is not showing A-fib at this time on home transmission received 07/16/2020. Patient called and updated that she is not in A-fib at this time. Advised patient that the device clinic would continue to monitor.

## 2020-07-20 ENCOUNTER — Other Ambulatory Visit (HOSPITAL_COMMUNITY): Payer: Self-pay

## 2020-07-20 DIAGNOSIS — H40003 Preglaucoma, unspecified, bilateral: Secondary | ICD-10-CM | POA: Diagnosis not present

## 2020-07-20 MED ORDER — SILDENAFIL CITRATE 20 MG PO TABS: 20.0000 mg | ORAL_TABLET | Freq: Three times a day (TID) | ORAL | 3 refills | Status: DC

## 2020-07-21 ENCOUNTER — Other Ambulatory Visit: Payer: Self-pay | Admitting: Family Medicine

## 2020-07-23 ENCOUNTER — Other Ambulatory Visit: Payer: Self-pay

## 2020-07-23 ENCOUNTER — Telehealth (HOSPITAL_COMMUNITY): Payer: Self-pay | Admitting: Pharmacist

## 2020-07-23 NOTE — Telephone Encounter (Signed)
Obtained [facility] grant for copay assistance with PAH medications:   ID: [id number] Bin: [id number] PCN: [id number] Group: [id number] Aquilla Solian Amount: $10,000 Start Date: 06/23/2020 End Date: 06/22/2021  Patient fills [name] at [facility] and sildenafil and Opsumit at [facility].   Audry Riles, PharmD, BCPS, BCCP, CPP Heart Failure Clinic Pharmacist [phone]

## 2020-07-24 ENCOUNTER — Telehealth (HOSPITAL_COMMUNITY): Payer: Self-pay | Admitting: Pharmacy Technician

## 2020-07-24 NOTE — Telephone Encounter (Signed)
Called and gave [facility] and [facility] the patient's [employer] information.  Both pharmacies stated they would get the medications ready to ship out.   Charlann Boxer, CPhT

## 2020-07-25 ENCOUNTER — Telehealth (HOSPITAL_COMMUNITY): Payer: Self-pay | Admitting: Pharmacy Technician

## 2020-07-25 ENCOUNTER — Encounter: Payer: Self-pay | Admitting: Family Medicine

## 2020-07-25 ENCOUNTER — Telehealth (INDEPENDENT_AMBULATORY_CARE_PROVIDER_SITE_OTHER): Payer: Medicare HMO | Admitting: Family Medicine

## 2020-07-25 ENCOUNTER — Other Ambulatory Visit (HOSPITAL_COMMUNITY): Payer: Self-pay | Admitting: *Deleted

## 2020-07-25 ENCOUNTER — Other Ambulatory Visit (HOSPITAL_COMMUNITY): Payer: Self-pay

## 2020-07-25 ENCOUNTER — Encounter (HOSPITAL_COMMUNITY): Payer: Self-pay

## 2020-07-25 VITALS — Ht 60.0 in | Wt 193.0 lb

## 2020-07-25 DIAGNOSIS — E785 Hyperlipidemia, unspecified: Secondary | ICD-10-CM | POA: Diagnosis not present

## 2020-07-25 DIAGNOSIS — K529 Noninfective gastroenteritis and colitis, unspecified: Secondary | ICD-10-CM | POA: Diagnosis not present

## 2020-07-25 DIAGNOSIS — I5022 Chronic systolic (congestive) heart failure: Secondary | ICD-10-CM | POA: Diagnosis not present

## 2020-07-25 DIAGNOSIS — J9611 Chronic respiratory failure with hypoxia: Secondary | ICD-10-CM | POA: Diagnosis not present

## 2020-07-25 DIAGNOSIS — R5382 Chronic fatigue, unspecified: Secondary | ICD-10-CM | POA: Diagnosis not present

## 2020-07-25 DIAGNOSIS — R1011 Right upper quadrant pain: Secondary | ICD-10-CM

## 2020-07-25 DIAGNOSIS — I4819 Other persistent atrial fibrillation: Secondary | ICD-10-CM | POA: Diagnosis not present

## 2020-07-25 DIAGNOSIS — I272 Pulmonary hypertension, unspecified: Secondary | ICD-10-CM | POA: Diagnosis not present

## 2020-07-25 NOTE — Assessment & Plan Note (Signed)
Chronic issue.  I suspect this is related to her chronic cardiovascular and pulmonary issues.

## 2020-07-25 NOTE — Assessment & Plan Note (Addendum)
Improved with cholestyramine though this may be contributing to some right upper quadrant discomfort.  We will have her into the office for a hepatic function panel to check liver function.  She reports she is status post cholecystectomy.  I did encourage her to contact her GI physician to see if there is an alternative to cholestyramine given her abdominal cramping.

## 2020-07-25 NOTE — Assessment & Plan Note (Signed)
She will plan to see the heart failure clinic as planned.

## 2020-07-25 NOTE — Assessment & Plan Note (Signed)
Continue Crestor 10 mg once daily.

## 2020-07-25 NOTE — Telephone Encounter (Signed)
[employer] left a message stating that they needed a new Opsumit RX. Called to get clarification, and they do not need a new RX. I had the representative confirm that they do not need anything else from [place] at this time.  Her Opsumit and Sildenafil will be delivered on Thursday, 2/3.  Charlann Boxer, CPhT

## 2020-07-25 NOTE — Assessment & Plan Note (Signed)
Breathing is stable.  She remains on 4 L of oxygen.  She will continue to follow with her specialists.

## 2020-07-25 NOTE — Assessment & Plan Note (Signed)
She will continue to see the heart failure clinic as planned.

## 2020-07-25 NOTE — Progress Notes (Signed)
Virtual Visit via telephone Note  This visit type was conducted due to national recommendations for restrictions regarding the COVID-19 pandemic (e.g. social distancing).  This format is felt to be most appropriate for this patient at this time.  All issues noted in this document were discussed and addressed.  No physical exam was performed (except for noted visual exam findings with Video Visits).   I connected with Debra Saunders today at  2:45 PM EST by telephone and verified that I am speaking with the correct person using two identifiers. Location patient: home Location provider: work Persons participating in the virtual visit: patient, provider  I discussed the limitations, risks, security and privacy concerns of performing an evaluation and management service by telephone and the availability of in person appointments. I also discussed with the patient that there may be a patient responsible charge related to this service. The patient expressed understanding and agreed to proceed.  Interactive audio and video telecommunications were attempted between this provider and patient, however failed, due to patient having technical difficulties OR patient did not have access to video capability.  We continued and completed visit with audio only.   Reason for visit: f/u  HPI: A. fib: Patient remains on Eliquis and digoxin.  She has continued to have palpitations.  She was previously advised that she was in A. fib by cardiology and was sent to the ED for evaluation though the wait was too long and she did not stay.  Her EKG did not reveal A. fib.  Her lab work was generally stable and reassuring.  She continues to have palpitations on a mostly daily basis.  No shortness of breath.  No chest pain though does have chronic chest wall soreness.  Upper respiratory infection: Patient was having symptoms around the time of her ED visit with fever and sneezing and cough and congestion.  She reports she  had a negative Covid test and her symptoms have since resolved.  Hyperlipidemia: Taking Crestor.  No chest pain.  Some right upper quadrant cramping though she thinks it is related to the cholestyramine as it only occurs in the morning after she eats.  No myalgias.  Patient continues to have issues with her energy level.  Prior lab work-up has been unremarkable.  She remains on 4 L of oxygen for chronic respiratory failure related to pulmonary artery hypertension.   ROS: See pertinent positives and negatives per HPI.  Past Medical History:  Diagnosis Date  . Arthritis   . Atrial fibrillation, persistent ([place])    a. s/p TEE-guided DCCV on 08/20/2017  . Cardiorenal syndrome    a. 09/2017 Creat rose to 2.7 w/ diuresis-->HD x 2.  . Chest pain    a. 01/2012 Cath: nonobs dzs;  c. 04/2014 MV: No ischemia.  . Chronic back pain   . Chronic combined systolic (congestive) and diastolic (congestive) heart failure ([place])    a. Dx 2005 @ [facility];  b. 02/2014 Echo: EF 55-60%; c. 07/2017: EF 30-35%, diffuse HK, trivial AI, severe MR, moderate TR; d. 09/2017 Echo: EF 40-45%; e. 02/2018 Echo: EF 55-60%, Gr1 DD; f. 08/2019 Echo: EF 60-65%, Gr2 DD, Nl RV fxn, RVSP 49.36mmHg. Mod-Sev MR. Mild to mod TR. Mod PR. RA ~ 26mmHg.  [name] Chronic respiratory failure (HCC)    a. on home O2  . COPD (chronic obstructive pulmonary disease) ([place])    a. Home O2 - 3lpm  . Gastritis   . History of intracranial hemorrhage    a.  6/14 described by neurosurgery as "small frontal posttraumatic SAH " - pt was on xarelto @ the time.  . Hyperlipidemia   . Hypertension   . Interstitial lung disease ([place])   . Lipoma of back   . Lymphedema    a. Chronic LLE edema.  [name] NICM (nonischemic cardiomyopathy) ([place])    a. 09/2017 Echo: EF 40-45%; b. 09/2017 Cath: minor, insignificant CAD; c. 02/2018 Echo: EF 55-60%, Gr1 DD; d. 08/2019 Echo: EF 60-65%, Gr2 DD.  [name] Obesity   . PAH (pulmonary artery hypertension) ([place])    a. 09/2017 Echo: PASP 19mmHg; b. 09/2017  RHC: RA 23, RV 84/13, PA 84/29, PCWP 20, CO 3.89, CI 1.89. LVEDP 18.  . Sepsis with metabolic encephalopathy ([place])   . Sleep apnea   . Streptococcal bacteremia   . Stroke ([place])   . Subarachnoid hemorrhage ([place]) 01/19/2013  . Symptomatic bradycardia    a. s/p BSX PPM.  . Thyroid nodule   . Type II diabetes mellitus ([place])   . Urinary incontinence     Past Surgical History:  Procedure Laterality Date  . ABDOMINAL HYSTERECTOMY  1969  . BREAST BIOPSY Right 10/28/2019   lymph node bx, [place] Bx, pending path   . CARDIAC CATHETERIZATION  2013   @ [place]: No obstructive CAD: Only 20% ostial left circumflex.  [name] CARDIOVERSION N/A 10/12/2017   Procedure: CARDIOVERSION;  Surgeon: Minna Merritts, MD;  Location: [facility] ORS;  Service: Cardiovascular;  Laterality: N/A;  . CHOLECYSTECTOMY    . COLONOSCOPY WITH PROPOFOL N/A 09/10/2018   Procedure: COLONOSCOPY WITH PROPOFOL;  Surgeon: Manya Silvas, MD;  Location: [facility] ENDOSCOPY;  Service: Endoscopy;  Laterality: N/A;  . CYST REMOVAL TRUNK  2002   BACK  . DIALYSIS/PERMA CATHETER INSERTION N/A 10/06/2017   Procedure: DIALYSIS/PERMA CATHETER INSERTION;  Surgeon: Katha Cabal, MD;  Location: [place] CV LAB;  Service: Cardiovascular;  Laterality: N/A;  . INSERT / REPLACE / REMOVE PACEMAKER    . lipoma removal  2014   back  . PACEMAKER INSERTION  [address] [employer] dual chamber pacemaker implanted by Dr Caryl Comes for symptomatic bradycardia  . PERMANENT PACEMAKER INSERTION N/A 01/09/2014   Procedure: PERMANENT PACEMAKER INSERTION;  Surgeon: Deboraha Sprang, MD;  Location: [facility] CATH LAB;  Service: Cardiovascular;  Laterality: N/A;  . RIGHT HEART CATH N/A 04/19/2018   Procedure: RIGHT HEART CATH;  Surgeon: Larey Dresser, MD;  Location: [place] CV LAB;  Service: Cardiovascular;  Laterality: N/A;  . RIGHT/LEFT HEART CATH AND CORONARY ANGIOGRAPHY N/A 10/19/2017   Procedure: RIGHT/LEFT HEART CATH AND CORONARY ANGIOGRAPHY;  Surgeon: Wellington Hampshire, MD;  Location: [facility] CV LAB;  Service: Cardiovascular;  Laterality: N/A;  . TEE WITHOUT CARDIOVERSION N/A 08/20/2017   Procedure: TRANSESOPHAGEAL ECHOCARDIOGRAM (TEE) & Direct current cardioversion;  Surgeon: Minna Merritts, MD;  Location: [facility] ORS;  Service: Cardiovascular;  Laterality: N/A;  . TRIGGER FINGER RELEASE      Family History  Problem Relation Age of Onset  . Heart disease Mother   . Hypertension Mother   . COPD Mother        was a smoker  . Thyroid disease Mother   . Hypertension Father   . Hypertension Sister   . Thyroid disease Sister   . Breast cancer Sister   . Hypertension Sister   . Thyroid disease Sister   . Bone cancer Sister   . Breast cancer Maternal Aunt   . Kidney disease Neg Hx   .  Bladder Cancer Neg Hx     SOCIAL HX: Former smoker   Current Outpatient Medications:  .  acetaminophen (TYLENOL) 325 MG tablet, Take 325-650 mg by mouth every 6 (six) hours as needed for mild pain. Reported on 10/10/2015, Disp: , Rfl:  .  albuterol (PROVENTIL) (2.5 MG/3ML) 0.083% nebulizer solution, Take 3 mLs (2.5 mg total) by nebulization every 6 (six) hours., Disp: 75 mL, Rfl: 12 .  apixaban (ELIQUIS) 5 MG TABS tablet, Take 1 tablet (5 mg total) by mouth 2 (two) times daily. Needs appt for future refills, Disp: 60 tablet, Rfl: 0 .  cholestyramine (QUESTRAN) 4 g packet, Take 4 g by mouth daily. , Disp: , Rfl:  .  digoxin (LANOXIN) 0.125 MG tablet, Take 1 tablet by mouth once daily, Disp: 90 tablet, Rfl: 3 .  gabapentin (NEURONTIN) 100 MG capsule, TAKE 2 CAPSULES BY MOUTH THREE TIMES DAILY, Disp: 180 capsule, Rfl: 0 .  glucose blood test strip, Use with True Metrix glucometer to check sugars BID, Disp: 200 each, Rfl: 3 .  Lancets MISC, Use with True Metrix glucometer to check sugars BID, Disp: 200 each, Rfl: 3 .  macitentan (OPSUMIT) 10 MG tablet, Take 1 tablet (10 mg total) by mouth daily., Disp: 30 tablet, Rfl: 11 .  meclizine (ANTIVERT) 25 MG tablet,  Take 1 tablet (25 mg total) by mouth 3 (three) times daily as needed for dizziness., Disp: 30 tablet, Rfl: 0 .  Menthol, Topical Analgesic, (BIOFREEZE EX), Apply 1 application topically as needed (for pain). , Disp: , Rfl:  .  ondansetron (ZOFRAN) 8 MG tablet, Take 1 tablet (8 mg total) by mouth every 8 (eight) hours as needed for nausea or vomiting., Disp: 30 tablet, Rfl: 2 .  OXYGEN, Inhale 4 L into the lungs continuous., Disp: , Rfl:  .  potassium chloride SA (KLOR-CON) 20 MEQ tablet, Take 1 tablet (20 mEq total) by mouth daily., Disp: 90 tablet, Rfl: 0 .  rosuvastatin (CRESTOR) 10 MG tablet, Take 1 tablet (10 mg total) by mouth daily., Disp: 90 tablet, Rfl: 3 .  Selexipag (UPTRAVI) 1600 MCG TABS, Take 1,600 mcg by mouth 2 (two) times daily., Disp: , Rfl:  .  sildenafil (REVATIO) 20 MG tablet, Take 1 tablet (20 mg total) by mouth 3 (three) times daily., Disp: 270 tablet, Rfl: 3 .  spironolactone (ALDACTONE) 25 MG tablet, Take 1 tablet (25 mg total) by mouth at bedtime. Please call for a office visit [phone], Disp: 90 tablet, Rfl: 2 .  torsemide (DEMADEX) 20 MG tablet, Take 2 tablets (40 mg total) by mouth 2 (two) times daily., Disp: 180 tablet, Rfl: 3  EXAM: This was a telephone visit and thus no physical exam was completed.  ASSESSMENT AND PLAN:  Discussed the following assessment and plan:  Problem List Items Addressed This Visit    Atrial fibrillation ([place]) (Chronic)    Continues to have issues with palpitations though her EKG in the ED did not reveal A. fib.  She will keep her upcoming appointment with cardiology.  I encouraged her to discuss her decreased energy levels with them as I believe this may be related to several of her chronic medical issues.      Chronic diarrhea    Improved with cholestyramine though this may be contributing to some right upper quadrant discomfort.  We will have her into the office for a hepatic function panel to check liver function.  She reports she  is status post cholecystectomy.  I did encourage  her to contact her GI physician to see if there is an alternative to cholestyramine given her abdominal cramping.      Chronic respiratory failure with hypoxia (HCC) (Chronic)    Breathing is stable.  She remains on 4 L of oxygen.  She will continue to follow with her specialists.      Chronic systolic heart failure (HCC) (Chronic)    She will continue to see the heart failure clinic as planned.      Fatigue    Chronic issue.  I suspect this is related to her chronic cardiovascular and pulmonary issues.      Hyperlipidemia    Continue Crestor 10 mg once daily.      Pulmonary hypertension, unspecified ([place])    She will plan to see the heart failure clinic as planned.       Other Visit Diagnoses    Right upper quadrant pain    -  Primary   Relevant Orders   Hepatic function panel       I discussed the assessment and treatment plan with the patient. The patient was provided an opportunity to ask questions and all were answered. The patient agreed with the plan and demonstrated an understanding of the instructions.   The patient was advised to call back or seek an in-person evaluation if the symptoms worsen or if the condition fails to improve as anticipated.  I provided 12 minutes of non-face-to-face time during this encounter.   [name], MD

## 2020-07-25 NOTE — Progress Notes (Signed)
Today had a home visit with Debra Saunders.  Filled her med boxes up with the meds she had.  Called in her sildinifil and opsumet, they will be delivered next day tomorrow.  She will run out on Friday.  [name] will be delivered on Friday, she will run out on Sunday if not delivered.  Called in refill for her eliquis to [place], will follow up with HF clinic to make sure they refill it, no refills nad bottle says she needs an appt first.  She has appt with HF clinic next month.  She denies any specific problems today.  She states she has no energy and not sleeping well at night, she goes to sleep late in the night and gets up around noon.  She states she gets about 8 hr of sleep total.  Lungs are clear.  She has televisit with PCP today.  No more edema than her normal.  She has not weighed, she needs her grandson to place a battery in her scale.  He is bringing her one.  She states will weigh once that is done.  She states she is always eating, and not always good things.  She states has groceries coming today and she did order fruits, she states she is going to try eating more fruits and vegetables.  She has everything for daily living.  Will continue to visit for heart failure, diet and medication management.   [place] [phone]

## 2020-07-25 NOTE — Assessment & Plan Note (Signed)
Continues to have issues with palpitations though her EKG in the ED did not reveal A. fib.  She will keep her upcoming appointment with cardiology.  I encouraged her to discuss her decreased energy levels with them as I believe this may be related to several of her chronic medical issues.

## 2020-07-26 ENCOUNTER — Ambulatory Visit (INDEPENDENT_AMBULATORY_CARE_PROVIDER_SITE_OTHER): Payer: Medicare HMO | Admitting: Pharmacist

## 2020-07-26 ENCOUNTER — Other Ambulatory Visit: Payer: Self-pay

## 2020-07-26 DIAGNOSIS — I4819 Other persistent atrial fibrillation: Secondary | ICD-10-CM | POA: Diagnosis not present

## 2020-07-26 DIAGNOSIS — E785 Hyperlipidemia, unspecified: Secondary | ICD-10-CM

## 2020-07-26 DIAGNOSIS — E1122 Type 2 diabetes mellitus with diabetic chronic kidney disease: Secondary | ICD-10-CM | POA: Diagnosis not present

## 2020-07-26 DIAGNOSIS — I272 Pulmonary hypertension, unspecified: Secondary | ICD-10-CM

## 2020-07-26 DIAGNOSIS — I5042 Chronic combined systolic (congestive) and diastolic (congestive) heart failure: Secondary | ICD-10-CM

## 2020-07-26 DIAGNOSIS — K529 Noninfective gastroenteritis and colitis, unspecified: Secondary | ICD-10-CM

## 2020-07-26 DIAGNOSIS — J9611 Chronic respiratory failure with hypoxia: Secondary | ICD-10-CM

## 2020-07-26 DIAGNOSIS — J849 Interstitial pulmonary disease, unspecified: Secondary | ICD-10-CM

## 2020-07-26 MED ORDER — APIXABAN 5 MG PO TABS: 5.0000 mg | ORAL_TABLET | Freq: Two times a day (BID) | ORAL | 6 refills | Status: DC

## 2020-07-26 NOTE — Patient Instructions (Signed)
Visit Information  PATIENT GOALS: Goals Addressed              This Visit's Progress     Patient Stated   .  Medication Monitoring (pt-stated)        Patient Goals/Self-Care Activities . Over the next 90 days, patient will:  - take medications as prescribed check blood glucose periodically, document, and provide at future appointments weight daily, and contact provider if weight gain of > 3 lbs in 1 day, >5 lbs in 1 week        Patient verbalizes understanding of instructions provided today and agrees to view in [place].   Plan: Telephone follow up appointment with care management team member scheduled for:  ~ 8 weeks  [name], PharmD, [place], [place] Clinical Pharmacist [employer] [facility] [employer] [phone]

## 2020-07-26 NOTE — Chronic Care Management (AMB) (Signed)
Chronic Care Management Pharmacy Note  07/26/2020 Name:  Debra Saunders MRN:  [id number] DOB:  Aug 08, 1945  Subjective: Debra Saunders is an 75 y.o. year old female who is a primary patient of [name], [name], MD.  The CCM team was consulted for assistance with disease management and care coordination needs.    Engaged with patient by telephone for follow up visit in response to provider referral for pharmacy case management and/or care coordination services.   Consent to Services:  The patient was given information about Chronic Care Management services, agreed to services, and gave verbal consent prior to initiation of services.  Please see initial visit note for detailed documentation.   Objective:  Lab Results  Component Value Date   CREATININE 1.07 (H) 07/12/2020   CREATININE 0.87 06/25/2020   CREATININE 1.02 (H) 06/13/2020    Lab Results  Component Value Date   HGBA1C 5.4 04/10/2020       Component Value Date/Time   CHOL 100 02/03/2019 1413   CHOL 137 02/03/2013 0441   TRIG 121.0 02/03/2019 1413   TRIG 197 02/03/2013 0441   HDL 51.00 02/03/2019 1413   HDL 25 (L) 02/03/2013 0441   CHOLHDL 2 02/03/2019 1413   VLDL 24.2 02/03/2019 1413   VLDL 39 02/03/2013 0441   LDLCALC 25 02/03/2019 1413   LDLCALC 73 02/03/2013 0441   Lab Results  Component Value Date   WBC 6.9 07/12/2020   HGB 9.5 (L) 07/12/2020   HCT 31.4 (L) 07/12/2020   MCV 91.0 07/12/2020   PLT 188 07/12/2020   Clinical ASCVD: Yes  The ASCVD Risk score Mikey Bussing DC Jr., et al., 2013) failed to calculate for the following reasons:   The patient has a prior MI or stroke diagnosis     CHA2DS2-VASc Score = 6  This indicates a 9.7% annual risk of stroke. The patient's score is based upon: CHF History: Yes HTN History: Yes Diabetes History: Yes Stroke History: No Vascular Disease History: No Age Score: 2 Gender Score: 1     BP Readings from Last 3 Encounters:  07/25/20 108/68  07/12/20 116/65   06/27/20 130/70    Assessment: Review of patient past medical history, allergies, medications, health status, including review of consultants reports, laboratory and other test data, was performed as part of comprehensive evaluation and provision of chronic care management services.   SDOH:  (Social Determinants of Health) assessments and interventions performed:  SDOH Interventions   Flowsheet Row Most Recent Value  SDOH Interventions   Financial Strain Interventions Other (Comment)  [[employer] funding obtained by [facility]]  Transportation Interventions Other (Comment)  [reviewed to contact insurance benefits or previously given transportation resources]      CCM Care Plan  Allergies  Allergen Reactions  . Aspirin Hives and Other (See Comments)    Pt states that she is unable to take because she had bleeding in her brain  . Other Other (See Comments)    Pt states that she is unable to take blood thinners because she had bleeding in her brain; Blood Thinners-per doctor at [facility]  . Penicillins Hives, Shortness Of Breath, Swelling and Other (See Comments)    Has patient had a PCN reaction causing immediate rash, facial/tongue/throat swelling, SOB or lightheadedness with hypotension: Yes Has patient had a PCN reaction causing severe rash involving mucus membranes or skin necrosis: No Has patient had a PCN reaction that required hospitalization No Has patient had a PCN reaction occurring within  the last 10 years: No If all of the above answers are "NO", then may proceed with Cephalosporin use.    Medications Reviewed Today    Reviewed by De Hollingshead, RPH-CPP (Pharmacist) on 07/26/20 at [place] List Status: <None>  Medication Order Taking? Sig Documenting Provider Last Dose Status Informant  acetaminophen (TYLENOL) 325 MG tablet [id number] Yes Take 325-650 mg by mouth every 6 (six) hours as needed for mild pain. Reported on 10/10/2015 [provider]  Taking Active Self  albuterol (PROVENTIL) (2.5 MG/3ML) 0.083% nebulizer solution [id number] Yes Take 3 mLs (2.5 mg total) by nebulization every 6 (six) hours. Loletha Grayer, MD Taking Active   apixaban (ELIQUIS) 5 MG TABS tablet [id number] Yes Take 1 tablet (5 mg total) by mouth 2 (two) times daily. Needs appt for future refills Wellington Hampshire, MD Taking Active   cholestyramine (QUESTRAN) 4 g packet [id number] Yes Take 4 g by mouth daily.  [provider] Taking Active   digoxin (LANOXIN) 0.125 MG tablet [id number] Yes Take 1 tablet by mouth once daily Larey Dresser, MD Taking Active   gabapentin (NEURONTIN) 100 MG capsule [id number] Yes TAKE 2 CAPSULES BY MOUTH THREE TIMES DAILY [name], MD Taking Active   glucose blood test strip [id number] Yes Use with True Metrix glucometer to check sugars BID [name], MD Taking Active   Lancets MISC [id number]  Use with True Metrix glucometer to check sugars BID [name], MD  Active   macitentan (OPSUMIT) 10 MG tablet [id number] Yes Take 1 tablet (10 mg total) by mouth daily. Larey Dresser, MD Taking Active   meclizine (ANTIVERT) 25 MG tablet [id number] Yes Take 1 tablet (25 mg total) by mouth 3 (three) times daily as needed for dizziness. [name], MD Taking Active   Menthol, Topical Analgesic, [facility] EX) [id number] Yes Apply 1 application topically as needed (for pain).  [provider] Taking Active Self  ondansetron (ZOFRAN) 8 MG tablet [id number]  Take 1 tablet (8 mg total) by mouth every 8 (eight) hours as needed for nausea or vomiting.  Patient not taking: Reported on 07/25/2020   Jackolyn Confer, MD  Active            Med Note Cyndra Numbers Aug 16, 2017  9:43 AM)    OXYGEN [id number]  Inhale 4 L into the lungs continuous. [provider]  Active Self  potassium chloride SA (KLOR-CON) 20 MEQ tablet [id number] Yes Take 1 tablet (20 mEq total) by mouth daily. Larey Dresser,  MD Taking Active   rosuvastatin (CRESTOR) 10 MG tablet [id number] Yes Take 1 tablet (10 mg total) by mouth daily. Larey Dresser, MD Taking Active   Selexipag (UPTRAVI) 1600 MCG TABS [id number] Yes Take 1,600 mcg by mouth 2 (two) times daily. [provider] Taking Active Self  sildenafil (REVATIO) 20 MG tablet [id number] Yes Take 1 tablet (20 mg total) by mouth 3 (three) times daily. Larey Dresser, MD Taking Active   spironolactone (ALDACTONE) 25 MG tablet [id number] Yes Take 1 tablet (25 mg total) by mouth at bedtime. Please call for a office visit [phone] Larey Dresser, MD Taking Active   torsemide [facility]) 20 MG tablet [id number] Yes Take 2 tablets (40 mg total) by mouth 2 (two) times daily. Theora Gianotti, NP Taking Active           Patient Active Problem List   Diagnosis Date Noted  .  Burn 04/30/2020  . Obesity (BMI 30-39.9) 04/30/2020  . Elevated alkaline phosphatase level 04/30/2020  . Chronic diarrhea 11/04/2019  . Dysuria 09/29/2019  . Abnormal mammogram 09/29/2019  . Breast pain, right 08/08/2019  . Bilateral ovarian cysts 08/08/2019  . B12 deficiency 08/02/2019  . Iron deficiency 08/01/2019  . Dizziness 07/06/2019  . Shortness of breath   . Atrial fibrillation ([place]) 09/22/2017  . Acute bilateral low back pain without sciatica 01/28/2017  . Pain in limb 07/22/2016  . Neuropathy 06/09/2016  . Pain in the chest 03/10/2016  . Chronic deep vein thrombosis (DVT) ([facility]) 03/03/2016  . Chronic back pain 10/19/2015  . Goiter 04/25/2015  . Cervical disc disorder with radiculopathy of cervical region 03/30/2015  . Elevated LFTs 06/29/2014  . 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 Abnormality of gait 01/13/2014  . Sinoatrial node dysfunction ([place]) 01/09/2014  . Allergic rhinitis 10/26/2013  . Lymphedema 08/03/2013  . Constipation 04/12/2013  . Musculoskeletal chest pain 01/19/2013  . Fatigue 09/27/2012  . Anemia 04/28/2012  . Chronic  respiratory failure with hypoxia ([place]) 04/26/2012  . Diabetes ([place]) 03/30/2012  . Pulmonary hypertension, unspecified ([place]) 03/30/2012  . Hyperlipidemia 03/30/2012  . Hypothyroidism 03/30/2012  . Chronic systolic heart failure ([place]) 01/22/2012  . Hypertension     Conditions to be addressed/monitored: CHF, CAD, HTN, HLD and DMII  Care Plan : Medication Management  Updates made by De Hollingshead, RPH-CPP since 07/26/2020 12:00 AM    Problem: T2DM, HFpEF, pulmonary HTN, diarrhea     Long-Range Goal: Disease Progression Prevention   This Visit's Progress: On track  Recent Progress: On track  Priority: High  Note:   Current Barriers:  . Unable to maintain control of glycemic control . Polypharmacy, complex patient with multiple comorbidities   Pharmacist Clinical Goal(s):  [name] Over the next 90 days, patient will work with PharmD and provider towards optimized glycemic control . Over the next 90 days, patient will work with PharmD and provider towards optimized medication management  Interventions: . 1:1 collaboration with [name], MD regarding development and update of comprehensive plan of care as evidenced by provider attestation and co-signature . Inter-disciplinary care team collaboration (see longitudinal plan of care) . Comprehensive medication review performed; medication list updated in electronic medical record  Diabetes: . Diet controlled. Previous hx [name] (d/c d/t significant GI upset with recent restart) . Current glucose readings: fasting glucose: 100-120s;  . Current meal patterns: wakes up around noon, breakfast: oatmeal, grits, eggs; ham + egg sandwich sometimes; supper: couple apples, peaches. Occasionally will have a meal for supper, maybe 2-3 times monthly; Craving sweets lately: made banana pudding on Sunday, has a sweet potato pie, crunch and munch; does not eat every day.    . Peripheral neuropathy: gabapentin 200 mg TID . Extensive dietary  discussion regarding minimization of carbohydrates. Focus on appropriate serving sizes.   . Can consider addition of SGLT2 in the future, if needed  Hypertension, Heart Failure, Pulmonary Hypertension . Managed by [facility], Cardiology; Current treatment: digoxin 0.125 mg daily, Opsumit 10 mg daily, potassium 20 mEq daily, Uptravi 1600 mcg BID, sildenafil citrate 20 mg TID, spironolactone 25 mg daily, torsemide 40 mg QAM, 40 mg QPM  . Per chart review, [employer] funding for pulmonary HTN fund approved through HF clinic. Reports that Eliquis is affordable when she pays through her deductible. . Reports increase in weight. Weight this morning 195 lbs, 193 last on Sunday (scale was broken so she  didn't weigh in between). No increased SOB. Planning on taking torsemide 60 mg now, 40 mg later today as per last office visit w/ [name].  . Reviewed that if weight does not improve with 1 day of increased torsemide dosing to continue 60 mg QAM, 40 mg QPM through the weekend. Discussed that she may need to consider alternating 60 mg QAM and 40 mg QAM to maintain fluid status without feeling over diuresed. She verbalizes understanding and confirms that she will call the cardiology office if her weight continues to trend up over the next several days . Also reports an issue with her oxygen cutting off overnight. Receives from [place]. Notes that she will wake up overnight gagging, has to reset compressor. Encouraged to call [employer] to receive troubleshooting help.  . Continue current regimen along with HF team and cardiology collaboration. [name] clinic appt next month.  . Encouraged to communicate with myself or cardiology team if Eliquis becomes more expensive . Received communication from [employer] that Eliquis refill was needed. Collaboration w/ [facility] office to place refill request.    Hyperlipidemia: . Controlled per last lipid panel; current treatment: rosuvastatin  10 mg daily;  . Recommended continue current regimen  Atrial Fibrillation: . Appropriately managed; rhythm control: digoxin 0.125 mg daily (never had dig level checked that was previously ordered); anticoagulant treatment: Eliquis 5 mg BID . Eliquis dosing remains appropriate given age, weight, Scr.  . Recommended continue current regimen  along with cardiology collaboration.   Chronic Diarrhea: . Improved, current treatment cholestyramine 4 g daily. Did report some stomach cramping occasionally to PCP at last visit. Upper R quadrant. Notes it has been going on a few weeks. Denies constipation. Reports it can happen before or after meals. Very sore overnight last night. Is to come in for hepatic function panel tomorrow. She notes she is in communication with her daughter to get her brought in tomorrow. If she cannot get in for lab work tomorrow, she will have the cancer center add on this lab when she goes in on Monday. . Meclizine PRN dizziness/nausea, ondansetron PRN nausea/vomiting. Has not needed as much lately.  . Recommended to continue current treatment at this time along with GI collaboration. Reviewed importance of labwork as ordered by Dr. [name]  Anemia: . Managed by hematology for iron infusions. Upcoming appointment tomorrow. Transportation has been arranged.  . Encouraged to continue collaboration with hematology.    Patient Goals/Self-Care Activities . Over the next 90 days, patient will:  - take medications as prescribed check blood glucose periodically, document, and provide at future appointments weight daily, and contact provider if weight gain of > 3 lbs in 1 day, >5 lbs in 1 week  Follow Up Plan: Telephone follow up appointment with care management team member scheduled for: ~ 8 weeks       Medication Assistance: [employer] funding for pulmonary HTN  Follow Up:  Patient agrees to Care Plan and Follow-up.  Plan: Telephone follow up appointment with  care management team member scheduled for:  ~ 8 weeks  [name], PharmD, [employer], [place] Clinical Pharmacist [employer] at [employer] [phone]

## 2020-07-26 NOTE — Telephone Encounter (Signed)
Prescription refill request for Eliquis received. Indication: A-fib Last office visit: 06/13/2020 Scr: 1.07 on 07/12/20 Age: 75 Weight: 87.5kg  Request for Eliquis refill approved based on above findings.

## 2020-07-30 ENCOUNTER — Inpatient Hospital Stay: Payer: Medicare HMO | Attending: Internal Medicine

## 2020-08-09 ENCOUNTER — Telehealth (HOSPITAL_COMMUNITY): Payer: Self-pay

## 2020-08-09 ENCOUNTER — Telehealth: Payer: Self-pay | Admitting: Family Medicine

## 2020-08-09 NOTE — Telephone Encounter (Signed)
Noted.  Thank you for speaking with her.

## 2020-08-09 NOTE — Telephone Encounter (Signed)
Message noted below.  I do not recall the patient ever informing me of this previously.  It sounds like she could've had a TIA or a stroke.  She needs to be evaluated emergency department.  Can you contact the patient and advise her of this?  Thanks.

## 2020-08-09 NOTE — Telephone Encounter (Signed)
Please see if we can get her scheduled for follow-up to evaluate this further ASAP.

## 2020-08-09 NOTE — Telephone Encounter (Signed)
Patient was in ER x 2 weeks ago she was there for 5 hours and was advised would be 10 hours longer to be seen. Patient daughter says transportation is an issue. She has to take care of husband that has dementia, her mother will not go without someone with her Debra Saunders stated that she would try to get  Her sister to take her to [facility]  That she could make no promises other than that she would have to take her on Saturday when she has a sitter to stay with her husband.

## 2020-08-09 NOTE — Telephone Encounter (Signed)
I called the patients daughter to informed her that the provider wanted the patient to go to the ED for evaluation, the daughter stated she could not leave her husband who is really sick and her sister would take her but she has covid.  Patient was called and informed that she needed to be evaluated today but the patient is not having any symptoms today she stated.  I informed her that the provider stated if she does experience any of the symptoms she had before she needs to call EMS, Patient agreed and she understood.  Litsy Epting,cma

## 2020-08-09 NOTE — Telephone Encounter (Signed)
-----   Message from [name], [place] sent at 08/09/2020  1:08 PM EST ----- Spoke with [name] today and she said she had an episode yesterday consisting of not able to see, everything was black, urinated on herself, she felt weird in her face and was extremely exhausted rest of day.  She states her adult grandson witnessed it and lasted about 5 minutes.  She states checked her bp and pulse and both was normal.  Her sugar was 130's.  She states feels fine today.  She states happened about 2 weeks ago and right before Christmas.  She states she mentioned it to you at one of her appts.  Just thought I would let you know since they appear to be happening more frequent.  Thank you  [name] [place] [phone]

## 2020-08-09 NOTE — Telephone Encounter (Signed)
Contacted Gaelle to set up home visit for Monday.  She stated she had a black out event yesterday.  She states her grandson was there and witnessed it.  He states it lasted about 5 minutes, she could not see, she felt it in her face.  She urinated herself and was exhausted the rest of day.  No pain, denies headaches.  Blood pressure was normal and pulse normal.  She checked both after the event.  She was sitting when happened.  She states she had 1 about 2 weeks ago same thing and another right before Christmas.  She feels fine today.  Will forward a note to her primary. Will visit R[place] at home on meds and visit for heart failure, diet and medication management.   [place] [phone]

## 2020-08-13 ENCOUNTER — Other Ambulatory Visit (HOSPITAL_COMMUNITY): Payer: Self-pay

## 2020-08-13 ENCOUNTER — Encounter (HOSPITAL_COMMUNITY): Payer: Self-pay

## 2020-08-13 ENCOUNTER — Other Ambulatory Visit: Payer: Self-pay | Admitting: Family Medicine

## 2020-08-13 NOTE — Progress Notes (Signed)
Today had a home visit with Debra Saunders.  She states feels bad today but not as bad as yesterday.  She stated had a lot of chest pain yesterday.  None today but she feels tight in her jaws today and blurred vision.  Attempted to get her to be checked but she refused.  She states her daughter will be off 1 day this week and she wants to go see Dr Fletcher Anon.  Advised her to let me know when she wants to go and I will try to get her in.  She has all her medications.  Filled her med boxes.  She needs refills for gabapentin, eliquis, sildinafil, uptravi.  She does light house keeping around the house such as laundry, cooking dishes and some mopping.  Her grandson carries trash out for her.   She states she cooked a lot yesterday, made a cake and cooked supper.  She states could not eat much, took a few bites and felt full.  Lungs clear.  She always has on her oxygen.  She is aware of her up coming appts.  Will continue to visit for heart failure, diet and medication compliance.   Debra Saunders [phone]

## 2020-08-24 ENCOUNTER — Other Ambulatory Visit: Payer: Self-pay

## 2020-08-24 ENCOUNTER — Encounter (HOSPITAL_COMMUNITY): Payer: Self-pay | Admitting: Cardiology

## 2020-08-24 ENCOUNTER — Ambulatory Visit (HOSPITAL_COMMUNITY)
Admission: RE | Admit: 2020-08-24 | Discharge: 2020-08-24 | Disposition: A | Discharge status: Home / Self Care | Payer: Medicare HMO | Source: Ambulatory Visit | Attending: Cardiology | Admitting: Cardiology

## 2020-08-24 VITALS — BP 138/70 | HR 60 | Wt 192.2 lb

## 2020-08-24 DIAGNOSIS — Z9981 Dependence on supplemental oxygen: Secondary | ICD-10-CM | POA: Diagnosis not present

## 2020-08-24 DIAGNOSIS — J449 Chronic obstructive pulmonary disease, unspecified: Secondary | ICD-10-CM | POA: Insufficient documentation

## 2020-08-24 DIAGNOSIS — I5032 Chronic diastolic (congestive) heart failure: Secondary | ICD-10-CM | POA: Diagnosis not present

## 2020-08-24 DIAGNOSIS — G4733 Obstructive sleep apnea (adult) (pediatric): Secondary | ICD-10-CM | POA: Diagnosis not present

## 2020-08-24 DIAGNOSIS — Z8249 Family history of ischemic heart disease and other diseases of the circulatory system: Secondary | ICD-10-CM | POA: Diagnosis not present

## 2020-08-24 DIAGNOSIS — E1122 Type 2 diabetes mellitus with diabetic chronic kidney disease: Secondary | ICD-10-CM | POA: Insufficient documentation

## 2020-08-24 DIAGNOSIS — J849 Interstitial pulmonary disease, unspecified: Secondary | ICD-10-CM | POA: Diagnosis not present

## 2020-08-24 DIAGNOSIS — I2721 Secondary pulmonary arterial hypertension: Secondary | ICD-10-CM

## 2020-08-24 DIAGNOSIS — N183 Chronic kidney disease, stage 3 unspecified: Secondary | ICD-10-CM | POA: Insufficient documentation

## 2020-08-24 DIAGNOSIS — Z8673 Personal history of transient ischemic attack (TIA), and cerebral infarction without residual deficits: Secondary | ICD-10-CM | POA: Insufficient documentation

## 2020-08-24 DIAGNOSIS — R55 Syncope and collapse: Secondary | ICD-10-CM

## 2020-08-24 DIAGNOSIS — R001 Bradycardia, unspecified: Secondary | ICD-10-CM | POA: Insufficient documentation

## 2020-08-24 DIAGNOSIS — I5033 Acute on chronic diastolic (congestive) heart failure: Secondary | ICD-10-CM | POA: Insufficient documentation

## 2020-08-24 DIAGNOSIS — I2510 Atherosclerotic heart disease of native coronary artery without angina pectoris: Secondary | ICD-10-CM | POA: Diagnosis not present

## 2020-08-24 DIAGNOSIS — Z880 Allergy status to penicillin: Secondary | ICD-10-CM | POA: Diagnosis not present

## 2020-08-24 DIAGNOSIS — I480 Paroxysmal atrial fibrillation: Secondary | ICD-10-CM | POA: Diagnosis not present

## 2020-08-24 DIAGNOSIS — J9611 Chronic respiratory failure with hypoxia: Secondary | ICD-10-CM | POA: Insufficient documentation

## 2020-08-24 DIAGNOSIS — I5022 Chronic systolic (congestive) heart failure: Secondary | ICD-10-CM

## 2020-08-24 DIAGNOSIS — Z79899 Other long term (current) drug therapy: Secondary | ICD-10-CM | POA: Insufficient documentation

## 2020-08-24 DIAGNOSIS — Z950 Presence of cardiac pacemaker: Secondary | ICD-10-CM | POA: Diagnosis not present

## 2020-08-24 DIAGNOSIS — I130 Hypertensive heart and chronic kidney disease with heart failure and stage 1 through stage 4 chronic kidney disease, or unspecified chronic kidney disease: Secondary | ICD-10-CM | POA: Insufficient documentation

## 2020-08-24 DIAGNOSIS — Z87891 Personal history of nicotine dependence: Secondary | ICD-10-CM | POA: Diagnosis not present

## 2020-08-24 DIAGNOSIS — Z7901 Long term (current) use of anticoagulants: Secondary | ICD-10-CM | POA: Diagnosis not present

## 2020-08-24 DIAGNOSIS — Z86718 Personal history of other venous thrombosis and embolism: Secondary | ICD-10-CM | POA: Insufficient documentation

## 2020-08-24 DIAGNOSIS — J8410 Pulmonary fibrosis, unspecified: Secondary | ICD-10-CM | POA: Diagnosis not present

## 2020-08-24 DIAGNOSIS — Z886 Allergy status to analgesic agent status: Secondary | ICD-10-CM | POA: Diagnosis not present

## 2020-08-24 DIAGNOSIS — I5023 Acute on chronic systolic (congestive) heart failure: Secondary | ICD-10-CM | POA: Diagnosis not present

## 2020-08-24 DIAGNOSIS — I272 Pulmonary hypertension, unspecified: Secondary | ICD-10-CM | POA: Insufficient documentation

## 2020-08-24 LAB — BASIC METABOLIC PANEL
Anion gap: 9 (ref 5–15)
BUN: 23 mg/dL (ref 8–23)
CO2: 30 mmol/L (ref 22–32)
Calcium: 9.5 mg/dL (ref 8.9–10.3)
Chloride: 101 mmol/L (ref 98–111)
Creatinine, Ser: 1.01 mg/dL — ABNORMAL HIGH (ref 0.44–1.00)
GFR, Estimated: 57 mL/min — ABNORMAL LOW (ref >60)
Glucose, Bld: 110 mg/dL — ABNORMAL HIGH (ref 70–99)
Potassium: 4.2 mmol/L (ref 3.5–5.1)
Sodium: 140 mmol/L (ref 135–145)

## 2020-08-24 LAB — DIGOXIN LEVEL: Digoxin Level: 0.7 ng/mL — ABNORMAL LOW (ref 0.8–2.0)

## 2020-08-24 LAB — CBC
HCT: 35.4 % — ABNORMAL LOW (ref 36.0–46.0)
Hemoglobin: 10.4 g/dL — ABNORMAL LOW (ref 12.0–15.0)
MCH: 26.9 pg (ref 26.0–34.0)
MCHC: 29.4 g/dL — ABNORMAL LOW (ref 30.0–36.0)
MCV: 91.5 fL (ref 80.0–100.0)
Platelets: 205 10*3/uL (ref 150–400)
RBC: 3.87 MIL/uL (ref 3.87–5.11)
RDW: 12.3 % (ref 11.5–15.5)
WBC: 5.3 10*3/uL (ref 4.0–10.5)
nRBC: 0 % (ref 0.0–0.2)

## 2020-08-24 LAB — BRAIN NATRIURETIC PEPTIDE: B Natriuretic Peptide: 51.2 pg/mL (ref 0.0–100.0)

## 2020-08-24 MED ORDER — TORSEMIDE 20 MG PO TABS: 60.0000 mg | ORAL_TABLET | Freq: Two times a day (BID) | ORAL | 3 refills | Status: DC

## 2020-08-24 NOTE — Progress Notes (Signed)
6 Min Walk Test Completed  Pt ambulated 198.1 meters O2 Sat ranged 98-100% on room air HR ranged 60-73

## 2020-08-24 NOTE — Patient Instructions (Addendum)
6 minute walk test done today.  Labs done today. We will contact you only if your labs are abnormal.  INCREASE Torsemide to 60mg  (3 tablets) by mouth 2 times daily.  No other medication changes were made. Please continue all current medications as prescribed.  Your physician recommends that you schedule a follow-up appointment in: 10 days for a lab only appointment in 10 days at [facility] and in 3 weeks for an appointment with Dr. [name].  You have been referred to [facility]. They will contact you to schedule an appointment.   Your provider has recommended that  you wear a Zio Patch for 14 days.  This monitor will record your heart rhythm for our review.  IF you have any symptoms while wearing the monitor please press the button.  If you have any issues with the patch or you notice a red or orange light on it please call the company at [phone].  Once you remove the patch please mail it back to the company as soon as possible so we can get the results.   If you have any questions or concerns before your next appointment please send [place] a message through [place] or call our office at [phone].    TO LEAVE A MESSAGE FOR THE NURSE SELECT OPTION 2, PLEASE LEAVE A MESSAGE INCLUDING: . YOUR NAME . DATE OF BIRTH . CALL BACK NUMBER . REASON FOR CALL**this is important as we prioritize the call backs  YOU WILL RECEIVE A CALL BACK THE SAME DAY AS LONG AS YOU CALL BEFORE 4:00 PM   Do the following things EVERYDAY: 1) Weigh yourself in the morning before breakfast. Write it down and keep it in a log. 2) Take your medicines as prescribed 3) Eat low salt foods--Limit salt (sodium) to 2000 mg per day.  4) Stay as active as you can everyday 5) Limit all fluids for the day to less than 2 liters   At [facility], you and your health needs are our priority. As part of our continuing mission to provide you with exceptional heart care, we have created designated  Provider Care Teams. These Care Teams include your primary Cardiologist (physician) and Advanced Practice Providers (APPs- Physician Assistants and Nurse Practitioners) who all work together to provide you with the care you need, when you need it.   You may see any of the following providers on your designated Care Team at your next follow up: [name] Dr Glori Bickers . Dr [name] . Darrick Grinder, NP . Lyda Jester, PA . Audry Riles, PharmD   Please be sure to bring in all your medications bottles to every appointment.     You are scheduled for a Cardiac Catheterization on Thursday, March 10 with Dr. [name].  1. Please arrive at the [facility] (Main Entrance A) at [facility]: [address] [place], [place] [address] at 9:00 AM (This time is two hours before your procedure to ensure your preparation). Free valet parking service is available.   Special note: Every effort is made to have your procedure done on time. Please understand that emergencies sometimes delay scheduled procedures.  2. Diet: Do not eat solid foods after midnight.  The patient may have clear liquids until 5am upon the day of the procedure.  3. YOU MUST HAVE PRE PROCEDURE COVID TESTING DONE Tuesday MARCH 8TH 2022 BETWEEN 8AM-2PM AT [facility].  4.Medications: HOLD YOUR ELIQUIS THE DAY BEFORE AND THE DAY OF  YOUR PROCEDURE.  On the morning of your procedure DO NOT TAKE YOUR TORSEMIDE, you may take the rest of your morning medicines NOT listed above.  You may use sips of water.  5. Plan for one night stay--bring personal belongings. 6. Bring a current list of your medications and current insurance cards. 7. You MUST have a responsible person to drive you home. 8. Someone MUST be with you the first 24 hours after you arrive home or your discharge will be delayed. 9. Please wear clothes that are easy to get on and off and wear slip-on shoes.  Thank you for allowing [place] to care for  you!   -- [place] [facility]

## 2020-08-26 NOTE — H&P (View-Only) (Signed)
Date:  08/26/2020   ID:  Debra Saunders, DOB January 14, 1946, MRN [id number]   Provider location: [facility] Type of Visit: Established patient   PCP:  [name], MD  Cardiologist:  [name], MD Primary HF: Dr. [name]  Chief Complaint: Shortness of breath   History of Present Illness: Debra Saunders is a 75 y.o. 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  Admitted 10/24-10/29/19 with acute on chronic diastolic HF. HF team consulted with concerns for low output. PICC was placed and milrinone to support RV function was started along with IV Lasix to facilitate diuresis. RHC done to evaluate PAH (see below). V/Q scan negative. High res CT showed pulmonary fibrosis. Pulmonary consult and recommended stopping amiodarone. She was transitioned to torsemide 60 mg BID. DC weight: 226 lbs.   She has started on selexipag for pulmonary hypertension in addition to Opsumit and Revatio.      Echo in 3/21 showed EF 60-65%, normal RV, PASP 50 mmHg, moderate-severe MR.   She returns for followup of pulmonary hypertension and diastolic CHF.  For about 2 months, she has been symptomatically worse.  Weight has been rising and she has abdominal swelling.  She is now short of breath walking around the house, short of breath walking in the office today.  About 2 wks ago, she had an episode of severe lightheadedness while sitting at her kitchen table.  There was no prodrome, no palpitations.  She has had presyncopal episodes every 2 - 3 weeks, they do not sound like orthostatic hypotension. She is tolerating selexipag with mild jaw pain as the only side effect.  No chest pain.   6 minute walk (1/20): 98 m 6 minute walk (7/20): 213 m 6 minute walk (3/22): 198 m  Labs (11/19): digoxin 0.8 Labs (1/20): K 4.2, creatinine 1.36 => 1.33, digoxin 0.8 Labs (2/20): digoxin 0.2, K 4.3, creatinine 1.32 Labs (5/20): K 4.5, creatinine 1.4, digoxin 0.8 Labs (1/22): K 4, creatinine 1.07  PMH: 1. Lymphedema: Left lower leg (chronic).  2. COPD 3. 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  - PFTs (2017) with FEV1 64%, FEV1/FVC ratio 106%, TLC 49%, DLCO 39% (restrictive).  - CT chest high resolution (10/19): Pulmonary parenchymal pattern of fibrosis.  4. Atrial fibrillation: Paroxysmal.  She is off amiodarone due to lung disease.  - 2/19 DCCV.  5. H/o DVT s/p IVC filter in 2004.  6. CAD: 4/19 coronary angiography with minimal coronary disease.  7. Bradycardia: [employer] PPM.  8. H/o traumatic SAH 2014.  9. CKD: Stage 3.  10. H/o CVA 11. OSA: CPAP 12. HTN 13. Type II diabetes 14. Pulmonary hypertension:  - PFTs 2017 with severe restriction.  - Echo (10/19): EF 60-65%, PASP 52 mmHg - RHC (4/19): Severe PAH with PVR 7.2 WU.  - RHC (10/19): mean RA 6, PA 56/17 mean 33, PCWP mean 12, CI 3.23, PVR 3.4 WU.  - V/Q scan (10/19): No chronic PE.  - Anti-SCL 70 and ANA negative.  ACE normal.  RF slightly elevated at 15.  - Echo (3/21): EF 60-65%, normal RV, PASP 50 mmHg, moderate-severe MR. FH: mother with CHF 15. 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  16. Mitral regurgitation: moderate to severe on 3/22 echo.   Current Outpatient Medications  Medication Sig Dispense Refill  . acetaminophen (TYLENOL) 325 MG tablet Take 325-650 mg by mouth every 6 (six) hours as needed for mild pain. Reported on 10/10/2015    . albuterol (PROVENTIL) (2.5 MG/3ML) 0.083% nebulizer solution Take 3 mLs (2.5 mg total) by nebulization every 6 (six) hours. 75 mL 12  . apixaban (ELIQUIS) 5 MG TABS tablet Take 1 tablet (5 mg total) by mouth 2  (two) times daily. Needs appt for future refills 60 tablet 6  . cholestyramine (QUESTRAN) 4 g packet Take 4 g by mouth daily.     . digoxin (LANOXIN) 0.125 MG tablet Take 1 tablet by mouth once daily 90 tablet 3  . gabapentin (NEURONTIN) 100 MG capsule TAKE 2 CAPSULES BY MOUTH THREE TIMES DAILY 180 capsule 1  . glucose blood test strip Use with True Metrix glucometer to check sugars BID 200 each 3  . Lancets MISC Use with True Metrix glucometer to check sugars BID 200 each 3  . macitentan (OPSUMIT) 10 MG tablet Take 1 tablet (10 mg total) by mouth daily. 30 tablet 11  . meclizine (ANTIVERT) 25 MG tablet Take 1 tablet (25 mg total) by mouth 3 (three) times daily as needed for dizziness. 30 tablet 0  . Menthol, Topical Analgesic, (BIOFREEZE EX) Apply 1 application topically as needed (for pain).     . ondansetron (ZOFRAN) 8 MG tablet Take 1 tablet (8 mg total) by mouth every 8 (eight) hours as needed for nausea or vomiting. 30 tablet 2  . OXYGEN Inhale 4 L into the lungs continuous.    . potassium chloride SA (KLOR-CON) 20 MEQ tablet Take 1 tablet (20 mEq total) by mouth daily. 90 tablet 0  . rosuvastatin (CRESTOR) 10 MG tablet Take 1 tablet (10 mg total) by mouth daily. 90 tablet 3  . Selexipag (UPTRAVI) 1600 MCG TABS Take 1,600 mcg by mouth 2 (two) times daily.    . sildenafil (REVATIO) 20 MG tablet Take 1 tablet (20 mg total) by mouth 3 (three) times daily. 270 tablet 3  . spironolactone (ALDACTONE) 25 MG tablet Take 1 tablet (25 mg total) by mouth at bedtime. Please call for a office visit [phone] 90 tablet 2  . torsemide (DEMADEX) 20 MG tablet Take 3 tablets (60 mg total) by mouth 2 (two) times daily. 180 tablet 3   No current facility-administered medications for this encounter.    Allergies:   Aspirin, Other, and Penicillins   Social History:  The patient  reports that she quit smoking about 30 years ago. Her smoking use included cigarettes. She has a 0.60 pack-year smoking history.  She has never used smokeless tobacco. She reports that she does not drink alcohol and does not use drugs.   Family History:  The patient's family history includes Bone cancer in her sister; Breast cancer in her maternal aunt and sister; COPD in her mother; Heart disease in her mother; Hypertension in her father, mother, sister, and sister; Thyroid disease in her mother, sister, and sister.   ROS:  Please see the history of present illness.   All other systems are personally reviewed and negative.   Exam:   General: NAD Neck: JVP 8-9 cm, no thyromegaly or thyroid nodule.  Lungs: Clear to auscultation bilaterally with normal respiratory effort. CV: Nondisplaced PMI.  Heart regular S1/S2, no S3/S4, 1/6 HSM apex.  No carotid bruit.  Normal pedal pulses.  Abdomen:  Soft, nontender, no hepatosplenomegaly, no distention.  Skin: Intact without lesions or rashes.  Neurologic: Alert and oriented x 3.  Psych: Normal affect. Extremities: No clubbing or cyanosis. Left leg lymphedema.  HEENT: Normal.   Recent Labs: 04/10/2020: ALT 7; TSH 0.98 08/24/2020: B Natriuretic Peptide 51.2; BUN 23; Creatinine, Ser 1.01; Hemoglobin 10.4; Platelets 205; Potassium 4.2; Sodium 140  Personally reviewed   Wt Readings from Last 3 Encounters:  08/24/20 87.2 kg (192 lb 3.2 oz)  08/13/20 86.2 kg (190 lb)  07/25/20 87.5 kg (193 lb)    ASSESSMENT AND PLAN:  1. 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 Echo 10/19 showed EF 60-65% with moderate LVH, RV was difficult to see.  Echo in 3/21 with EF 60-65%, normal RV, PASP 50 mmHg, moderate-severe MR.During admission in 10/19, she required milrinone to support RV.  NYHA class III symptoms, worse x 2 months. Pulmonary fibrosis likely plays a role in her dyspnea, but she is also volume overloaded on exam today.     -Increase torsemide to 60 mg bid with BMET/BNP today and in 10 days.  - I will arrange for  repeat echo.    2. Pulmonary hypertension: She was followed in the past at [facility]. Based on 4/19 cath, she had severe PAH with PVR 7.2 WU. She has been on sildenafil 20 mg tid x 4 years. Chronic hypoxemic respiratory failure, on home oxygen. She has a history of remote DVT (2004) but V/Q scan negative for evidence of chronic PE. High resolution CT chest in 10/19 showed pulmonary fibrosis, and last PFTs in 2017 showed severe restriction.  Serological workup for rheumatological diseases was unremarkable.  There has been concern for possible burnt-out sarcoidosis as cause of her interstitial fibrosis and hilar adenopathy. She has family history of this. Amiodarone was stopped given lung parenchymal disease. 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 Repeat RHC in 10/19 showedmoderate pulmonary hypertension, PVR 3.4 WU which is significantly lower than on the 4/19 cath. She remains limited with NYHA class III symptoms (worse recently). 6 minute walk today was mildly worse than prior.  - Continue sildenafil and Opsumit.   - Continue selexipag 1600 mcg bid.  - Continue digoxin for RV support, check digoxin level today.  - Continue CPAP and home oxygen.  - Continue followup with pulmonary for ILD, ?sarcoidosis => I will try to arrange appointment.  - With worsening dyspnea and volume overload, I am going to arrange for RHC to assess filling pressures as wall as PA pressure. We discussed risks/benefits and she agrees to procedure.  She will hold Eliquis the day before and day of cath.   3. CKD stage 3: BMET today.  4. Atrial fibrillation: Paroxysmal.  Off amiodarone with parenchymal lung disease. No palpitations.  She is a-paced today.  -Continue apixaban 5 mg bid. CBC today. 5. OSA: Continue CPAP. 6. HTN: BP controlled.  7. 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     8. Presyncope: She has had several episodes, no prodrome.  - I will have her wear Zio patch x 2 weeks.   Followup 3 wks.     Signed, [name], MD  08/26/2020  [place] [address] [facility] and [place]  [address] [phone] (office) [phone] (fax)

## 2020-08-26 NOTE — Progress Notes (Signed)
Date:  08/26/2020   ID:  Debra Saunders, DOB 10-08-1945, MRN [id number]   Provider location: [facility] Type of Visit: Established patient   PCP:  [name], MD  Cardiologist:  [name], MD Primary HF: Dr. [name]  Chief Complaint: Shortness of breath   History of Present Illness: Debra Saunders is a 75 y.o. 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  Admitted 10/24-10/29/19 with acute on chronic diastolic HF. HF team consulted with concerns for low output. PICC was placed and milrinone to support RV function was started along with IV Lasix to facilitate diuresis. RHC done to evaluate PAH (see below). V/Q scan negative. High res CT showed pulmonary fibrosis. Pulmonary consult and recommended stopping amiodarone. She was transitioned to torsemide 60 mg BID. DC weight: 226 lbs.   She has started on selexipag for pulmonary hypertension in addition to Opsumit and Revatio.      Echo in 3/21 showed EF 60-65%, normal RV, PASP 50 mmHg, moderate-severe MR.   She returns for followup of pulmonary hypertension and diastolic CHF.  For about 2 months, she has been symptomatically worse.  Weight has been rising and she has abdominal swelling.  She is now short of breath walking around the house, short of breath walking in the office today.  About 2 wks ago, she had an episode of severe lightheadedness while sitting at her kitchen table.  There was no prodrome, no palpitations.  She has had presyncopal episodes every 2 - 3 weeks, they do not sound like orthostatic hypotension. She is tolerating selexipag with mild jaw pain as the only side effect.  No chest pain.   6 minute walk (1/20): 98 m 6 minute walk (7/20): 213 m 6 minute walk (3/22): 198 m  Labs (11/19): digoxin 0.8 Labs (1/20): K 4.2, creatinine 1.36 => 1.33, digoxin 0.8 Labs (2/20): digoxin 0.2, K 4.3, creatinine 1.32 Labs (5/20): K 4.5, creatinine 1.4, digoxin 0.8 Labs (1/22): K 4, creatinine 1.07  PMH: 1. Lymphedema: Left lower leg (chronic).  2. COPD 3. 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  - PFTs (2017) with FEV1 64%, FEV1/FVC ratio 106%, TLC 49%, DLCO 39% (restrictive).  - CT chest high resolution (10/19): Pulmonary parenchymal pattern of fibrosis.  4. Atrial fibrillation: Paroxysmal.  She is off amiodarone due to lung disease.  - 2/19 DCCV.  5. H/o DVT s/p IVC filter in 2004.  6. CAD: 4/19 coronary angiography with minimal coronary disease.  7. Bradycardia: [employer] PPM.  8. H/o traumatic SAH 2014.  9. CKD: Stage 3.  10. H/o CVA 11. OSA: CPAP 12. HTN 13. Type II diabetes 14. Pulmonary hypertension:  - PFTs 2017 with severe restriction.  - Echo (10/19): EF 60-65%, PASP 52 mmHg - RHC (4/19): Severe PAH with PVR 7.2 WU.  - RHC (10/19): mean RA 6, PA 56/17 mean 33, PCWP mean 12, CI 3.23, PVR 3.4 WU.  - V/Q scan (10/19): No chronic PE.  - Anti-SCL 70 and ANA negative.  ACE normal.  RF slightly elevated at 15.  - Echo (3/21): EF 60-65%, normal RV, PASP 50 mmHg, moderate-severe MR. FH: mother with CHF 15. 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  16. Mitral regurgitation: moderate to severe on 3/22 echo.   Current Outpatient Medications  Medication Sig Dispense Refill  . acetaminophen (TYLENOL) 325 MG tablet Take 325-650 mg by mouth every 6 (six) hours as needed for mild pain. Reported on 10/10/2015    . albuterol (PROVENTIL) (2.5 MG/3ML) 0.083% nebulizer solution Take 3 mLs (2.5 mg total) by nebulization every 6 (six) hours. 75 mL 12  . apixaban (ELIQUIS) 5 MG TABS tablet Take 1 tablet (5 mg total) by mouth 2  (two) times daily. Needs appt for future refills 60 tablet 6  . cholestyramine (QUESTRAN) 4 g packet Take 4 g by mouth daily.     . digoxin (LANOXIN) 0.125 MG tablet Take 1 tablet by mouth once daily 90 tablet 3  . gabapentin (NEURONTIN) 100 MG capsule TAKE 2 CAPSULES BY MOUTH THREE TIMES DAILY 180 capsule 1  . glucose blood test strip Use with True Metrix glucometer to check sugars BID 200 each 3  . Lancets MISC Use with True Metrix glucometer to check sugars BID 200 each 3  . macitentan (OPSUMIT) 10 MG tablet Take 1 tablet (10 mg total) by mouth daily. 30 tablet 11  . meclizine (ANTIVERT) 25 MG tablet Take 1 tablet (25 mg total) by mouth 3 (three) times daily as needed for dizziness. 30 tablet 0  . Menthol, Topical Analgesic, (BIOFREEZE EX) Apply 1 application topically as needed (for pain).     . ondansetron (ZOFRAN) 8 MG tablet Take 1 tablet (8 mg total) by mouth every 8 (eight) hours as needed for nausea or vomiting. 30 tablet 2  . OXYGEN Inhale 4 L into the lungs continuous.    . potassium chloride SA (KLOR-CON) 20 MEQ tablet Take 1 tablet (20 mEq total) by mouth daily. 90 tablet 0  . rosuvastatin (CRESTOR) 10 MG tablet Take 1 tablet (10 mg total) by mouth daily. 90 tablet 3  . Selexipag (UPTRAVI) 1600 MCG TABS Take 1,600 mcg by mouth 2 (two) times daily.    . sildenafil (REVATIO) 20 MG tablet Take 1 tablet (20 mg total) by mouth 3 (three) times daily. 270 tablet 3  . spironolactone (ALDACTONE) 25 MG tablet Take 1 tablet (25 mg total) by mouth at bedtime. Please call for a office visit [phone] 90 tablet 2  . torsemide (DEMADEX) 20 MG tablet Take 3 tablets (60 mg total) by mouth 2 (two) times daily. 180 tablet 3   No current facility-administered medications for this encounter.    Allergies:   Aspirin, Other, and Penicillins   Social History:  The patient  reports that she quit smoking about 30 years ago. Her smoking use included cigarettes. She has a 0.60 pack-year smoking history.  She has never used smokeless tobacco. She reports that she does not drink alcohol and does not use drugs.   Family History:  The patient's family history includes Bone cancer in her sister; Breast cancer in her maternal aunt and sister; COPD in her mother; Heart disease in her mother; Hypertension in her father, mother, sister, and sister; Thyroid disease in her mother, sister, and sister.   ROS:  Please see the history of present illness.   All other systems are personally reviewed and negative.   Exam:   General: NAD Neck: JVP 8-9 cm, no thyromegaly or thyroid nodule.  Lungs: Clear to auscultation bilaterally with normal respiratory effort. CV: Nondisplaced PMI.  Heart regular S1/S2, no S3/S4, 1/6 HSM apex.  No carotid bruit.  Normal pedal pulses.  Abdomen:  Soft, nontender, no hepatosplenomegaly, no distention.  Skin: Intact without lesions or rashes.  Neurologic: Alert and oriented x 3.  Psych: Normal affect. Extremities: No clubbing or cyanosis. Left leg lymphedema.  HEENT: Normal.   Recent Labs: 04/10/2020: ALT 7; TSH 0.98 08/24/2020: B Natriuretic Peptide 51.2; BUN 23; Creatinine, Ser 1.01; Hemoglobin 10.4; Platelets 205; Potassium 4.2; Sodium 140  Personally reviewed   Wt Readings from Last 3 Encounters:  08/24/20 87.2 kg (192 lb 3.2 oz)  08/13/20 86.2 kg (190 lb)  07/25/20 87.5 kg (193 lb)    ASSESSMENT AND PLAN:  1. 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 Echo 10/19 showed EF 60-65% with moderate LVH, RV was difficult to see.  Echo in 3/21 with EF 60-65%, normal RV, PASP 50 mmHg, moderate-severe MR.During admission in 10/19, she required milrinone to support RV.  NYHA class III symptoms, worse x 2 months. Pulmonary fibrosis likely plays a role in her dyspnea, but she is also volume overloaded on exam today.     -Increase torsemide to 60 mg bid with BMET/BNP today and in 10 days.  - I will arrange for  repeat echo.    2. Pulmonary hypertension: She was followed in the past at [facility]. Based on 4/19 cath, she had severe PAH with PVR 7.2 WU. She has been on sildenafil 20 mg tid x 4 years. Chronic hypoxemic respiratory failure, on home oxygen. She has a history of remote DVT (2004) but V/Q scan negative for evidence of chronic PE. High resolution CT chest in 10/19 showed pulmonary fibrosis, and last PFTs in 2017 showed severe restriction.  Serological workup for rheumatological diseases was unremarkable.  There has been concern for possible burnt-out sarcoidosis as cause of her interstitial fibrosis and hilar adenopathy. She has family history of this. Amiodarone was stopped given lung parenchymal disease. 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 Repeat RHC in 10/19 showedmoderate pulmonary hypertension, PVR 3.4 WU which is significantly lower than on the 4/19 cath. She remains limited with NYHA class III symptoms (worse recently). 6 minute walk today was mildly worse than prior.  - Continue sildenafil and Opsumit.   - Continue selexipag 1600 mcg bid.  - Continue digoxin for RV support, check digoxin level today.  - Continue CPAP and home oxygen.  - Continue followup with pulmonary for ILD, ?sarcoidosis => I will try to arrange appointment.  - With worsening dyspnea and volume overload, I am going to arrange for RHC to assess filling pressures as wall as PA pressure. We discussed risks/benefits and she agrees to procedure.  She will hold Eliquis the day before and day of cath.   3. CKD stage 3: BMET today.  4. Atrial fibrillation: Paroxysmal.  Off amiodarone with parenchymal lung disease. No palpitations.  She is a-paced today.  -Continue apixaban 5 mg bid. CBC today. 5. OSA: Continue CPAP. 6. HTN: BP controlled.  7. 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     8. Presyncope: She has had several episodes, no prodrome.  - I will have her wear Zio patch x 2 weeks.   Followup 3 wks.     Signed, [name], MD  08/26/2020  [place] [address] [facility] and [place] [address] [phone] (office) [phone] (fax)

## 2020-08-27 ENCOUNTER — Telehealth: Payer: Self-pay | Admitting: Family Medicine

## 2020-08-27 ENCOUNTER — Encounter: Payer: Self-pay | Admitting: Internal Medicine

## 2020-08-27 ENCOUNTER — Telehealth: Payer: Self-pay | Admitting: Internal Medicine

## 2020-08-27 ENCOUNTER — Other Ambulatory Visit: Payer: Self-pay

## 2020-08-27 ENCOUNTER — Inpatient Hospital Stay (HOSPITAL_BASED_OUTPATIENT_CLINIC_OR_DEPARTMENT_OTHER): Payer: Medicare HMO | Admitting: Internal Medicine

## 2020-08-27 ENCOUNTER — Ambulatory Visit
Admission: RE | Admit: 2020-08-27 | Discharge: 2020-08-27 | Disposition: A | Discharge status: Home / Self Care | Payer: Medicare HMO | Source: Ambulatory Visit | Attending: Internal Medicine | Admitting: Internal Medicine

## 2020-08-27 ENCOUNTER — Inpatient Hospital Stay: Payer: Medicare HMO | Attending: Internal Medicine

## 2020-08-27 ENCOUNTER — Inpatient Hospital Stay: Payer: Medicare HMO

## 2020-08-27 VITALS — BP 129/97 | HR 69 | Temp 98.0°F | Resp 18 | Wt 193.6 lb

## 2020-08-27 VITALS — BP 145/74 | HR 59 | Temp 97.0°F | Resp 18

## 2020-08-27 DIAGNOSIS — Z8673 Personal history of transient ischemic attack (TIA), and cerebral infarction without residual deficits: Secondary | ICD-10-CM | POA: Insufficient documentation

## 2020-08-27 DIAGNOSIS — E611 Iron deficiency: Secondary | ICD-10-CM

## 2020-08-27 DIAGNOSIS — Z87891 Personal history of nicotine dependence: Secondary | ICD-10-CM | POA: Diagnosis not present

## 2020-08-27 DIAGNOSIS — I130 Hypertensive heart and chronic kidney disease with heart failure and stage 1 through stage 4 chronic kidney disease, or unspecified chronic kidney disease: Secondary | ICD-10-CM | POA: Diagnosis not present

## 2020-08-27 DIAGNOSIS — N83201 Unspecified ovarian cyst, right side: Secondary | ICD-10-CM | POA: Insufficient documentation

## 2020-08-27 DIAGNOSIS — N83202 Unspecified ovarian cyst, left side: Secondary | ICD-10-CM | POA: Diagnosis not present

## 2020-08-27 DIAGNOSIS — D509 Iron deficiency anemia, unspecified: Secondary | ICD-10-CM | POA: Insufficient documentation

## 2020-08-27 DIAGNOSIS — R110 Nausea: Secondary | ICD-10-CM | POA: Diagnosis not present

## 2020-08-27 DIAGNOSIS — J449 Chronic obstructive pulmonary disease, unspecified: Secondary | ICD-10-CM | POA: Insufficient documentation

## 2020-08-27 DIAGNOSIS — R232 Flushing: Secondary | ICD-10-CM | POA: Insufficient documentation

## 2020-08-27 DIAGNOSIS — I2721 Secondary pulmonary arterial hypertension: Secondary | ICD-10-CM | POA: Diagnosis not present

## 2020-08-27 DIAGNOSIS — R001 Bradycardia, unspecified: Secondary | ICD-10-CM | POA: Diagnosis not present

## 2020-08-27 DIAGNOSIS — E538 Deficiency of other specified B group vitamins: Secondary | ICD-10-CM

## 2020-08-27 DIAGNOSIS — E1122 Type 2 diabetes mellitus with diabetic chronic kidney disease: Secondary | ICD-10-CM | POA: Insufficient documentation

## 2020-08-27 DIAGNOSIS — R197 Diarrhea, unspecified: Secondary | ICD-10-CM | POA: Insufficient documentation

## 2020-08-27 DIAGNOSIS — Z7901 Long term (current) use of anticoagulants: Secondary | ICD-10-CM | POA: Diagnosis not present

## 2020-08-27 DIAGNOSIS — R1011 Right upper quadrant pain: Secondary | ICD-10-CM | POA: Insufficient documentation

## 2020-08-27 DIAGNOSIS — I509 Heart failure, unspecified: Secondary | ICD-10-CM | POA: Diagnosis not present

## 2020-08-27 DIAGNOSIS — I4819 Other persistent atrial fibrillation: Secondary | ICD-10-CM | POA: Diagnosis not present

## 2020-08-27 DIAGNOSIS — N183 Chronic kidney disease, stage 3 unspecified: Secondary | ICD-10-CM | POA: Diagnosis not present

## 2020-08-27 DIAGNOSIS — I5032 Chronic diastolic (congestive) heart failure: Secondary | ICD-10-CM | POA: Diagnosis not present

## 2020-08-27 DIAGNOSIS — Z79899 Other long term (current) drug therapy: Secondary | ICD-10-CM | POA: Insufficient documentation

## 2020-08-27 DIAGNOSIS — Z86718 Personal history of other venous thrombosis and embolism: Secondary | ICD-10-CM | POA: Insufficient documentation

## 2020-08-27 DIAGNOSIS — I132 Hypertensive heart and chronic kidney disease with heart failure and with stage 5 chronic kidney disease, or end stage renal disease: Secondary | ICD-10-CM | POA: Diagnosis not present

## 2020-08-27 DIAGNOSIS — E1140 Type 2 diabetes mellitus with diabetic neuropathy, unspecified: Secondary | ICD-10-CM | POA: Insufficient documentation

## 2020-08-27 DIAGNOSIS — N186 End stage renal disease: Secondary | ICD-10-CM | POA: Insufficient documentation

## 2020-08-27 DIAGNOSIS — I428 Other cardiomyopathies: Secondary | ICD-10-CM | POA: Diagnosis not present

## 2020-08-27 DIAGNOSIS — R0782 Intercostal pain: Secondary | ICD-10-CM

## 2020-08-27 DIAGNOSIS — Z803 Family history of malignant neoplasm of breast: Secondary | ICD-10-CM | POA: Insufficient documentation

## 2020-08-27 LAB — CBC WITH DIFFERENTIAL/PLATELET
Abs Immature Granulocytes: 0.01 10*3/uL (ref 0.00–0.07)
Basophils Absolute: 0 10*3/uL (ref 0.0–0.1)
Basophils Relative: 1 %
Eosinophils Absolute: 0.2 10*3/uL (ref 0.0–0.5)
Eosinophils Relative: 3 %
HCT: 31.3 % — ABNORMAL LOW (ref 36.0–46.0)
Hemoglobin: 9.4 g/dL — ABNORMAL LOW (ref 12.0–15.0)
Immature Granulocytes: 0 %
Lymphocytes Relative: 28 %
Lymphs Abs: 1.5 10*3/uL (ref 0.7–4.0)
MCH: 27.4 pg (ref 26.0–34.0)
MCHC: 30 g/dL (ref 30.0–36.0)
MCV: 91.3 fL (ref 80.0–100.0)
Monocytes Absolute: 0.4 10*3/uL (ref 0.1–1.0)
Monocytes Relative: 7 %
Neutro Abs: 3.2 10*3/uL (ref 1.7–7.7)
Neutrophils Relative %: 61 %
Platelets: 183 10*3/uL (ref 150–400)
RBC: 3.43 MIL/uL — ABNORMAL LOW (ref 3.87–5.11)
RDW: 12.5 % (ref 11.5–15.5)
WBC: 5.3 10*3/uL (ref 4.0–10.5)
nRBC: 0 % (ref 0.0–0.2)

## 2020-08-27 LAB — IRON AND TIBC
Iron: 53 ug/dL (ref 28–170)
Saturation Ratios: 15 % (ref 10.4–31.8)
TIBC: 358 ug/dL (ref 250–450)
UIBC: 305 ug/dL

## 2020-08-27 LAB — BASIC METABOLIC PANEL
Anion gap: 9 (ref 5–15)
BUN: 32 mg/dL — ABNORMAL HIGH (ref 8–23)
CO2: 27 mmol/L (ref 22–32)
Calcium: 8.6 mg/dL — ABNORMAL LOW (ref 8.9–10.3)
Chloride: 104 mmol/L (ref 98–111)
Creatinine, Ser: 1 mg/dL (ref 0.44–1.00)
GFR, Estimated: 58 mL/min — ABNORMAL LOW (ref >60)
Glucose, Bld: 113 mg/dL — ABNORMAL HIGH (ref 70–99)
Potassium: 4 mmol/L (ref 3.5–5.1)
Sodium: 140 mmol/L (ref 135–145)

## 2020-08-27 LAB — FERRITIN: Ferritin: 196 ng/mL (ref 11–307)

## 2020-08-27 MED ORDER — CYANOCOBALAMIN 1000 MCG/ML IJ SOLN
1000.0000 ug | Freq: Once | INTRAMUSCULAR | Status: AC
  Administered 2020-08-27: 1000 ug via INTRAMUSCULAR
  Filled 2020-08-27: qty 1

## 2020-08-27 MED ORDER — IRON SUCROSE 20 MG/ML IV SOLN
200.0000 mg | Freq: Once | INTRAVENOUS | Status: AC
  Administered 2020-08-27: 200 mg via INTRAVENOUS
  Filled 2020-08-27: qty 10

## 2020-08-27 MED ORDER — SODIUM CHLORIDE 0.9 % IV SOLN
Freq: Once | INTRAVENOUS | Status: AC
  Filled 2020-08-27: qty 250

## 2020-08-27 NOTE — Progress Notes (Signed)
Pt tolerated infusion well with no s/s of distress or reaction noted. Pt transported to medical mall via wheelchair for ultrasound per MD order. Pt stable at time of discharge.

## 2020-08-27 NOTE — Progress Notes (Signed)
Debra Saunders  Patient Care Team: [name], MD as PCP - General (Family Medicine) Wellington Hampshire, MD as PCP - Cardiology (Cardiology) Deboraha Sprang, MD as PCP - Electrophysiology (Cardiology) Thom Chimes, MD as Referring Physician (Allergy) Juanito Doom, MD as Referring Physician (Internal Medicine) De Hollingshead, RPH-CPP as Pharmacist (Pharmacist) Cammie Sickle, MD as Consulting Physician (Hematology and Oncology) Jon Billings, RN as [place] Management Anemia CHIEF COMPLAINTS/PURPOSE OF CONSULTATION: Anemia   HEMATOLOGY HISTORY  # CHORNIC IRON DEFICIENCY ANEMIA [BL 10-11]-Jan today 21 PCP Hb-8.8; Antonieta Pert sat-5%; ferritin 11] EGD-; colonoscopy MARCH 2020 -polyp [[facility]-hold further work-up secondary to cardiac issues]; remote-EGD-?;  February 2021-LDH/haptoglobin normal; kappa lambda light chain ratio/M panel normal;  on Venofer; November 2020-CT scan question early cirrhosis/no splenomegaly.  No acute process; MARCH 2021- STOOL OCCULT x3- NEGATIVE.   #B12 deficiency-IM every 3 to 4 months  #2014 -question blood transfusion  [patient admitted for stroke]; CHF chronic -[ 2004 on 4 lits; Dr.Arida/[place]]; CT scan chest 2021 -no adenopathy; fibrotic changes; walker; CKD stage III-   #Incidental right axillary mass-s/p US biopsy [PCP] -negative for malignancy.  HISTORY OF PRESENTING ILLNESS:  Debra Saunders 75 y.o.  female anemia/likely secondary to CKD stage III is here for follow-up.  Right upper quadrant pain x 3 weeks; progressively worse. 7/10; cant sleep. No precipitating factors; improved with tylenol. Positive for nausea/ flushed.   Diarrhea if not taking cholestyramine.   Continues to chronic shortness of breath especially with exertion.  Chronic mild swelling in the legs.  Debra Saunders continues with oxygen.  No blood in stools or black stools.  Review of Systems  Constitutional: Positive for  malaise/fatigue. Negative for chills, diaphoresis, fever and weight loss.  HENT: Negative for nosebleeds and sore throat.   Eyes: Negative for double vision.  Respiratory: Positive for shortness of breath. Negative for cough, hemoptysis, sputum production and wheezing.   Cardiovascular: Negative for chest pain, palpitations, orthopnea and leg swelling.       Right chest wall/midaxillary area.  Tender to touch.  No rash.  Gastrointestinal: Positive for diarrhea. Negative for abdominal pain, blood in stool, constipation, heartburn, melena, nausea and vomiting.  Genitourinary: Negative for dysuria, frequency and urgency.  Musculoskeletal: Negative for back pain and joint pain.  Skin: Negative.  Negative for itching and rash.  Neurological: Negative for dizziness, tingling, focal weakness, weakness and headaches.  Endo/Heme/Allergies: Does not bruise/bleed easily.  Psychiatric/Behavioral: Negative for depression. The patient is not nervous/anxious and does not have insomnia.     MEDICAL HISTORY:  Past Medical History:  Diagnosis Date  . Arthritis   . Atrial fibrillation, persistent ([place])    a. s/p TEE-guided DCCV on 08/20/2017  . Cardiorenal syndrome    a. 09/2017 Creat rose to 2.7 w/ diuresis-->HD x 2.  . Chest pain    a. 01/2012 Cath: nonobs dzs;  c. 04/2014 MV: No ischemia.  . Chronic back pain   . Chronic combined systolic (congestive) and diastolic (congestive) heart failure ([place])    a. Dx 2005 @ [facility];  b. 02/2014 Echo: EF 55-60%; c. 07/2017: EF 30-35%, diffuse HK, trivial AI, severe MR, moderate TR; d. 09/2017 Echo: EF 40-45%; e. 02/2018 Echo: EF 55-60%, Gr1 DD; f. 08/2019 Echo: EF 60-65%, Gr2 DD, Nl RV fxn, RVSP 49.83mmHg. Mod-Sev MR. Mild to mod TR. Mod PR. RA ~ 27mmHg.  [name] Chronic respiratory failure (HCC)    a. on home O2  .  COPD (chronic obstructive pulmonary disease) ([place])    a. Home O2 - 3lpm  . Gastritis   . History of intracranial hemorrhage    a. 6/14 described by neurosurgery as  "small frontal posttraumatic SAH " - pt was on xarelto @ the time.  . Hyperlipidemia   . Hypertension   . Interstitial lung disease ([place])   . Lipoma of back   . Lymphedema    a. Chronic LLE edema.  [name] NICM (nonischemic cardiomyopathy) ([place])    a. 09/2017 Echo: EF 40-45%; b. 09/2017 Cath: minor, insignificant CAD; c. 02/2018 Echo: EF 55-60%, Gr1 DD; d. 08/2019 Echo: EF 60-65%, Gr2 DD.  [name] Obesity   . PAH (pulmonary artery hypertension) ([place])    a. 09/2017 Echo: PASP 17mmHg; b. 09/2017 RHC: RA 23, RV 84/13, PA 84/29, PCWP 20, CO 3.89, CI 1.89. LVEDP 18.  . Sepsis with metabolic encephalopathy ([place])   . Sleep apnea   . Streptococcal bacteremia   . Stroke ([place])   . Subarachnoid hemorrhage ([place]) 01/19/2013  . Symptomatic bradycardia    a. s/p BSX PPM.  . Thyroid nodule   . Type II diabetes mellitus ([place])   . Urinary incontinence     SURGICAL HISTORY: Past Surgical History:  Procedure Laterality Date  . ABDOMINAL HYSTERECTOMY  1969  . BREAST BIOPSY Right 10/28/2019   lymph node bx, [place] Bx, pending path   . CARDIAC CATHETERIZATION  2013   @ [place]: No obstructive CAD: Only 20% ostial left circumflex.  [name] CARDIOVERSION N/A 10/12/2017   Procedure: CARDIOVERSION;  Surgeon: Minna Merritts, MD;  Location: [facility] ORS;  Service: Cardiovascular;  Laterality: N/A;  . CHOLECYSTECTOMY    . COLONOSCOPY WITH PROPOFOL N/A 09/10/2018   Procedure: COLONOSCOPY WITH PROPOFOL;  Surgeon: Manya Silvas, MD;  Location: [facility] ENDOSCOPY;  Service: Endoscopy;  Laterality: N/A;  . CYST REMOVAL TRUNK  2002   BACK  . DIALYSIS/PERMA CATHETER INSERTION N/A 10/06/2017   Procedure: DIALYSIS/PERMA CATHETER INSERTION;  Surgeon: Katha Cabal, MD;  Location: [facility] LAB;  Service: Cardiovascular;  Laterality: N/A;  . INSERT / REPLACE / REMOVE PACEMAKER    . lipoma removal  2014   back  . PACEMAKER INSERTION  [address] Scientific dual chamber pacemaker implanted by Dr Caryl Comes for symptomatic bradycardia  .  PERMANENT PACEMAKER INSERTION N/A 01/09/2014   Procedure: PERMANENT PACEMAKER INSERTION;  Surgeon: Deboraha Sprang, MD;  Location: [facility] CATH LAB;  Service: Cardiovascular;  Laterality: N/A;  . RIGHT HEART CATH N/A 04/19/2018   Procedure: RIGHT HEART CATH;  Surgeon: Larey Dresser, MD;  Location: [facility];  Service: Cardiovascular;  Laterality: N/A;  . RIGHT/LEFT HEART CATH AND CORONARY ANGIOGRAPHY N/A 10/19/2017   Procedure: RIGHT/LEFT HEART CATH AND CORONARY ANGIOGRAPHY;  Surgeon: Wellington Hampshire, MD;  Location: [facility];  Service: Cardiovascular;  Laterality: N/A;  . TEE WITHOUT CARDIOVERSION N/A 08/20/2017   Procedure: TRANSESOPHAGEAL ECHOCARDIOGRAM (TEE) & Direct current cardioversion;  Surgeon: Minna Merritts, MD;  Location: [facility] ORS;  Service: Cardiovascular;  Laterality: N/A;  . TRIGGER FINGER RELEASE      SOCIAL HISTORY: Social History   Socioeconomic History  . Marital status: Divorced    Spouse name: Not on file  . Number of children: 4  . Years of education: 23  . Highest education level: High school graduate  Occupational History  . Occupation: Retired- factory work    Comment: exposed to Pharmacist, community  Tobacco Use  .  Smoking status: Former Smoker    Packs/day: 0.30    Years: 2.00    Pack years: 0.60    Types: Cigarettes    Quit date: 06/23/1990    Years since quitting: 30.2  . Smokeless tobacco: Never Used  Vaping Use  . Vaping Use: Never used  Substance and Sexual Activity  . Alcohol use: No  . Drug use: No  . Sexual activity: Not Currently  Other Topics Concern  . Not on file  Social History Narrative   Lives in [place] with daughter. Has 4 children. No pets.      Work - Restaurant manager, fast food, retired      Diet - regular      02/14/2019: reports has Meals On Wheels delivered to her home;       Remote hx of smoking > 30 years; No alcohol-    Social Determinants of Health   Financial Resource Strain: Low Risk   . Difficulty of Paying  Living Expenses: Not hard at all  Food Insecurity: No Food Insecurity  . Worried About Charity fundraiser in the Last Year: Never true  . Ran Out of Food in the Last Year: Never true  Transportation Needs: Unmet Transportation Needs  . Lack of Transportation (Medical): Yes  . Lack of Transportation (Non-Medical): Yes  Physical Activity: Not on file  Stress: Not on file  Social Connections: Not on file  Intimate Partner Violence: Not on file    FAMILY HISTORY: Family History  Problem Relation Age of Onset  . Heart disease Mother   . Hypertension Mother   . COPD Mother        was a smoker  . Thyroid disease Mother   . Hypertension Father   . Hypertension Sister   . Thyroid disease Sister   . Breast cancer Sister   . Hypertension Sister   . Thyroid disease Sister   . Bone cancer Sister   . Breast cancer Maternal Aunt   . Kidney disease Neg Hx   . Bladder Cancer Neg Hx     ALLERGIES:  is allergic to aspirin, other, and penicillins.  MEDICATIONS:  Current Outpatient Medications  Medication Sig Dispense Refill  . acetaminophen (TYLENOL) 325 MG tablet Take 325-650 mg by mouth every 6 (six) hours as needed for mild pain. Reported on 10/10/2015    . albuterol (PROVENTIL) (2.5 MG/3ML) 0.083% nebulizer solution Take 3 mLs (2.5 mg total) by nebulization every 6 (six) hours. (Patient taking differently: Take 2.5 mg by nebulization every 6 (six) hours as needed for wheezing or shortness of breath.) 75 mL 12  . apixaban (ELIQUIS) 5 MG TABS tablet Take 1 tablet (5 mg total) by mouth 2 (two) times daily. Needs appt for future refills 60 tablet 6  . cholestyramine (QUESTRAN) 4 GM/DOSE powder Take 4 g by mouth daily.    . digoxin (LANOXIN) 0.125 MG tablet Take 1 tablet by mouth once daily (Patient taking differently: Take 0.125 mg by mouth every evening.) 90 tablet 3  . gabapentin (NEURONTIN) 100 MG capsule TAKE 2 CAPSULES BY MOUTH THREE TIMES DAILY (Patient taking differently: Take 200 mg by  mouth 3 (three) times daily.) 180 capsule 1  . glucose blood test strip Use with True Metrix glucometer to check sugars BID 200 each 3  . Lancets MISC Use with True Metrix glucometer to check sugars BID 200 each 3  . macitentan (OPSUMIT) 10 MG tablet Take 1 tablet (10 mg total) by mouth daily. 30 tablet  11  . meclizine (ANTIVERT) 25 MG tablet Take 1 tablet (25 mg total) by mouth 3 (three) times daily as needed for dizziness. 30 tablet 0  . Menthol, Topical Analgesic, (BIOFREEZE EX) Apply 1 application topically 4 (four) times daily as needed (for pain).    . ondansetron (ZOFRAN) 8 MG tablet Take 1 tablet (8 mg total) by mouth every 8 (eight) hours as needed for nausea or vomiting. 30 tablet 2  . OXYGEN Inhale 4 L into the lungs continuous.    . potassium chloride SA (KLOR-CON) 20 MEQ tablet Take 1 tablet (20 mEq total) by mouth daily. 90 tablet 0  . rosuvastatin (CRESTOR) 10 MG tablet Take 1 tablet (10 mg total) by mouth daily. 90 tablet 3  . Selexipag (UPTRAVI) 1600 MCG TABS Take 1,600 mcg by mouth 2 (two) times daily.    . sildenafil (REVATIO) 20 MG tablet Take 1 tablet (20 mg total) by mouth 3 (three) times daily. 270 tablet 3  . spironolactone (ALDACTONE) 25 MG tablet Take 1 tablet (25 mg total) by mouth at bedtime. Please call for a office visit [phone] 90 tablet 2  . torsemide (DEMADEX) 20 MG tablet Take 3 tablets (60 mg total) by mouth 2 (two) times daily. 180 tablet 3   No current facility-administered medications for this visit.      PHYSICAL EXAMINATION:   Vitals:   08/27/20 1325  BP: (!) 129/97  Pulse: 69  Resp: 18  Temp: 98 F (36.7 C)  SpO2: 96%   Filed Weights   08/27/20 1325  Weight: 193 lb 9.6 oz (87.8 kg)    Physical Exam Constitutional:      Comments: Patient in wheelchair.  Alone.  Debra Saunders is on O2 nasal cannula.  HENT:     Head: Normocephalic and atraumatic.     Mouth/Throat:     Pharynx: No oropharyngeal exudate.  Eyes:     Pupils: Pupils are equal,  round, and reactive to light.  Cardiovascular:     Rate and Rhythm: Normal rate and regular rhythm.  Pulmonary:     Effort: No respiratory distress.     Breath sounds: No wheezing.     Comments: Decreased air entry bilaterally.  No wheeze or crackles.  Tenderness right lateral midaxillary chest wall no rash.  No swelling.  No lumps or bumps noted Abdominal:     General: Bowel sounds are normal. There is no distension.     Palpations: Abdomen is soft. There is no mass.     Tenderness: There is no abdominal tenderness. There is no guarding or rebound.  Musculoskeletal:        General: Swelling present. No tenderness. Normal range of motion.     Cervical back: Normal range of motion and neck supple.  Skin:    General: Skin is warm.  Neurological:     Mental Status: Debra Saunders is alert and oriented to person, place, and time.  Psychiatric:        Mood and Affect: Affect normal.     LABORATORY DATA:  I have reviewed the data as listed Lab Results  Component Value Date   WBC 5.3 08/27/2020   HGB 9.4 (L) 08/27/2020   HCT 31.3 (L) 08/27/2020   MCV 91.3 08/27/2020   PLT 183 08/27/2020   Recent Labs    01/30/20 1127 01/30/20 1127 03/26/20 1317 04/10/20 1354 04/20/20 1435 06/13/20 1621 06/25/20 1334 07/12/20 1710 08/24/20 1506 08/27/20 1257  NA 139  --  138 139  --  140   < > 141 140 140  K 3.8  --  3.7 3.8  --  4.0   < > 4.0 4.2 4.0  CL 103  --  104 102  --  102   < > 101 101 104  CO2 26  --  27 31  --  25   < > 30 30 27   GLUCOSE 97  --  113* 99  --  111*   < > 131* 110* 113*  BUN 26*  --  26* 30*  --  23   < > 28* 23 32*  CREATININE 1.13*  --  0.89 1.08  --  1.02*   < > 1.07* 1.01* 1.00  CALCIUM 8.6*  --  8.4* 8.9  --  8.8   < > 8.7* 9.5 8.6*  GFRNONAA 47*  --  >60  --   --  54*   < > 53* 57* 58*  GFRAA 55*  --  >60  --   --  62  --   --   --   --   PROT  --   --  7.3 6.9  --   --   --   --   --  7.4  ALBUMIN  --   --  3.6 4.0  --   --   --   --   --  3.8  AST  --   --  16  13  --   --   --   --   --  15  ALT  --   --  19 7  --   --   --   --   --  12  ALKPHOS  --    < > 137* 133* 163*  --   --   --   --  107  BILITOT  --   --  0.6 0.4  --   --   --   --   --  0.6  BILIDIR  --   --   --   --   --   --   --   --   --  0.1  IBILI  --   --   --   --   --   --   --   --   --  0.5   < > = values in this interval not displayed.     US ABDOMEN LIMITED RUQ (LIVER/GB)  Result Date: 08/27/2020 CLINICAL DATA:  Right upper quadrant pain x3 weeks EXAM: ULTRASOUND ABDOMEN LIMITED RIGHT UPPER QUADRANT COMPARISON:  None. FINDINGS: Gallbladder: Surgically absent. Common bile duct: Diameter: 6 mm Liver: No focal lesion identified. Within normal limits in parenchymal echogenicity. Portal vein is patent on color Doppler imaging with normal direction of blood flow towards the liver. Other: None. IMPRESSION: Unremarkable right upper quadrant ultrasound in a patient status post cholecystectomy. Electronically Signed   By: Dahlia Bailiff MD   On: 08/27/2020 16:19    Iron deficiency # Chronic Anemia [BL10-11]-iron deficiency/? CKD stage III [today GFR >60] currently s/p Venofe. Hemoglobin today is 9.2 improved from around 8. JAN 2022- Iron sat-14%.  Proceed with Venofer today.   # RUQ pain- ? Etiology; add LFTs. US abdomen ASAP.   # Etiology of anemia- ? Chronic diarrhea/malabsopbtion/CKD- await iron studies today.  #CKD stage III-GFR  58-STABLE.    #History of DVT/s/p IVC filter; ?  A. Fib-Eliquis-STABLE>   #Borderline low B12-230; s/p B12 injection.  B12 injection today.   # DISPOSITION: will call with results - [phone]/home number # [place] abodmen STAT # venofer/b12 today #  Follow up in 2 months-MD-labs- cbc;bmp;iron studies/ferritin; possible Venofer OR RETACRIT; B12 injection- Dr.B  Addendum: Ultrasound abdomen negative for any mass lesions in the liver/or blood clots to explain her ongoing pain.  Recommend CT scan of the chest with contrast stat.  Patient awaiting cardiac  cath on 3/10 in [place]. Discussed with the patient-Debra Saunders is agreement.  Cc; Tammi Klippel  All questions were answered. The patient knows to call the clinic with any problems, questions or concerns.    Cammie Sickle, MD 08/28/2020 10:23 AM

## 2020-08-27 NOTE — Assessment & Plan Note (Addendum)
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 CKD stage III [today GFR >60] currently s/p Venofe. Hemoglobin today is 9.2 improved from around 8. JAN 2022- Iron sat-14%.  Proceed with Venofer today.   # RUQ pain- ? Etiology; add LFTs. US abdomen ASAP.   # Etiology of anemia- ? Chronic diarrhea/malabsopbtion/CKD- await iron studies today.  #CKD stage III-GFR  58-STABLE.    #History of DVT/s/p IVC filter; ?  A. Fib-Eliquis-STABLE>   #Borderline low B12-230; s/p B12 injection. B12 injection today.   # DISPOSITION: will call with results - [phone]/home number # [place] abodmen STAT # venofer/b12 today #  Follow up in 2 months-MD-labs- cbc;bmp;iron studies/ferritin; possible Venofer OR RETACRIT; B12 injection- Dr.B  Addendum: Ultrasound abdomen negative for any mass lesions in the liver/or blood clots to explain her ongoing pain.  Recommend CT scan of the chest with contrast stat.  Patient awaiting cardiac cath on 3/10 in [place]. Discussed with the patient-she is agreement.  Cc; Debra Saunders

## 2020-08-27 NOTE — Telephone Encounter (Signed)
On 3/07-I spoke with patient regarding the unremarkable results of the ultrasound liver/upper abdomen.  Recommend a CT of the chest for further evaluation.  Pt states that she is awaiting cardiac cath in [place] on 3/10.   C- please schedule CT STAT.   Thanks GB

## 2020-08-27 NOTE — Telephone Encounter (Signed)
   Telephone encounter was:  Successful.  08/27/2020 Name: Debra Saunders MRN: [id number] DOB: July 09, 1945  Debra Saunders is a 75 y.o. year old female who is a primary care patient of [name], [name], MD . The community resource team was consulted for assistance with Transportation Needs   Care guide performed the following interventions: Patients daughter [name] called to ask what the number for transportation through insurance was so that she could reserve a ride for her mother's upcoming appointment. Confirmed she had the right number and the right insurance information to give to the transportation line. .  Follow Up Plan:  No further follow up planned at this time. The patient has been provided with needed resources.  [place], Care Management Phone: [phone] Email: [email or web address]

## 2020-08-28 ENCOUNTER — Other Ambulatory Visit: Payer: Self-pay

## 2020-08-28 ENCOUNTER — Ambulatory Visit: Payer: Medicare HMO

## 2020-08-28 ENCOUNTER — Ambulatory Visit (INDEPENDENT_AMBULATORY_CARE_PROVIDER_SITE_OTHER): Payer: Medicare HMO

## 2020-08-28 ENCOUNTER — Other Ambulatory Visit
Admission: RE | Admit: 2020-08-28 | Discharge: 2020-08-28 | Disposition: A | Discharge status: Home / Self Care | Payer: Medicare HMO | Source: Ambulatory Visit | Attending: Internal Medicine | Admitting: Internal Medicine

## 2020-08-28 DIAGNOSIS — Z01812 Encounter for preprocedural laboratory examination: Secondary | ICD-10-CM | POA: Insufficient documentation

## 2020-08-28 DIAGNOSIS — I4819 Other persistent atrial fibrillation: Secondary | ICD-10-CM | POA: Diagnosis not present

## 2020-08-28 DIAGNOSIS — Z20822 Contact with and (suspected) exposure to covid-19: Secondary | ICD-10-CM | POA: Diagnosis not present

## 2020-08-28 DIAGNOSIS — I5032 Chronic diastolic (congestive) heart failure: Secondary | ICD-10-CM

## 2020-08-28 DIAGNOSIS — R1011 Right upper quadrant pain: Secondary | ICD-10-CM

## 2020-08-28 LAB — HEPATIC FUNCTION PANEL
ALT: 12 U/L (ref 0–44)
AST: 15 U/L (ref 15–41)
Albumin: 3.8 g/dL (ref 3.5–5.0)
Alkaline Phosphatase: 107 U/L (ref 38–126)
Bilirubin, Direct: 0.1 mg/dL (ref 0.0–0.2)
Indirect Bilirubin: 0.5 mg/dL (ref 0.3–0.9)
Total Bilirubin: 0.6 mg/dL (ref 0.3–1.2)
Total Protein: 7.4 g/dL (ref 6.5–8.1)

## 2020-08-29 ENCOUNTER — Encounter (HOSPITAL_COMMUNITY): Payer: Self-pay

## 2020-08-29 ENCOUNTER — Other Ambulatory Visit (HOSPITAL_COMMUNITY): Payer: Self-pay | Admitting: *Deleted

## 2020-08-29 ENCOUNTER — Ambulatory Visit: Payer: Medicare HMO

## 2020-08-29 DIAGNOSIS — I5022 Chronic systolic (congestive) heart failure: Secondary | ICD-10-CM

## 2020-08-29 LAB — CUP PACEART REMOTE DEVICE CHECK
Battery Remaining Longevity: 72 mo
Battery Remaining Percentage: 77 %
Brady Statistic RA Percent Paced: 68 %
Brady Statistic RV Percent Paced: 99 %
Date Time Interrogation Session: 20220309111100
Implantable Lead Implant Date: 20150720
Implantable Lead Implant Date: 20150720
Implantable Lead Location: 753859
Implantable Lead Location: 753860
Implantable Lead Model: 5076
Implantable Lead Model: 5076
Implantable Pulse Generator Implant Date: 20150720
Lead Channel Impedance Value: 369 Ohm
Lead Channel Impedance Value: 526 Ohm
Lead Channel Pacing Threshold Amplitude: 0.8 V
Lead Channel Pacing Threshold Pulse Width: 0.4 ms
Lead Channel Setting Pacing Amplitude: 2 V
Lead Channel Setting Pacing Amplitude: 2.4 V
Lead Channel Setting Pacing Pulse Width: 0.4 ms
Lead Channel Setting Sensing Sensitivity: 2.5 mV
Pulse Gen Serial Number: 390330

## 2020-08-29 LAB — SARS CORONAVIRUS 2 (TAT 6-24 HRS): SARS Coronavirus 2: NEGATIVE

## 2020-08-30 ENCOUNTER — Encounter (HOSPITAL_COMMUNITY)
Admission: RE | Disposition: A | Discharge status: Home / Self Care | Payer: Self-pay | Source: Home / Self Care | Attending: Cardiology

## 2020-08-30 ENCOUNTER — Other Ambulatory Visit: Payer: Self-pay

## 2020-08-30 ENCOUNTER — Ambulatory Visit (HOSPITAL_COMMUNITY)
Admission: RE | Admit: 2020-08-30 | Discharge: 2020-08-30 | Disposition: A | Discharge status: Home / Self Care | Payer: Medicare HMO | Attending: Cardiology | Admitting: Cardiology

## 2020-08-30 DIAGNOSIS — Z87891 Personal history of nicotine dependence: Secondary | ICD-10-CM | POA: Diagnosis not present

## 2020-08-30 DIAGNOSIS — N183 Chronic kidney disease, stage 3 unspecified: Secondary | ICD-10-CM | POA: Insufficient documentation

## 2020-08-30 DIAGNOSIS — I130 Hypertensive heart and chronic kidney disease with heart failure and stage 1 through stage 4 chronic kidney disease, or unspecified chronic kidney disease: Secondary | ICD-10-CM | POA: Insufficient documentation

## 2020-08-30 DIAGNOSIS — Z950 Presence of cardiac pacemaker: Secondary | ICD-10-CM | POA: Diagnosis not present

## 2020-08-30 DIAGNOSIS — G4733 Obstructive sleep apnea (adult) (pediatric): Secondary | ICD-10-CM | POA: Diagnosis not present

## 2020-08-30 DIAGNOSIS — I480 Paroxysmal atrial fibrillation: Secondary | ICD-10-CM | POA: Insufficient documentation

## 2020-08-30 DIAGNOSIS — I5033 Acute on chronic diastolic (congestive) heart failure: Secondary | ICD-10-CM | POA: Insufficient documentation

## 2020-08-30 DIAGNOSIS — R55 Syncope and collapse: Secondary | ICD-10-CM | POA: Insufficient documentation

## 2020-08-30 DIAGNOSIS — Z886 Allergy status to analgesic agent status: Secondary | ICD-10-CM | POA: Diagnosis not present

## 2020-08-30 DIAGNOSIS — Z8249 Family history of ischemic heart disease and other diseases of the circulatory system: Secondary | ICD-10-CM | POA: Diagnosis not present

## 2020-08-30 DIAGNOSIS — R001 Bradycardia, unspecified: Secondary | ICD-10-CM | POA: Diagnosis not present

## 2020-08-30 DIAGNOSIS — I272 Pulmonary hypertension, unspecified: Secondary | ICD-10-CM | POA: Insufficient documentation

## 2020-08-30 DIAGNOSIS — Z880 Allergy status to penicillin: Secondary | ICD-10-CM | POA: Insufficient documentation

## 2020-08-30 DIAGNOSIS — I509 Heart failure, unspecified: Secondary | ICD-10-CM | POA: Diagnosis not present

## 2020-08-30 DIAGNOSIS — Z7901 Long term (current) use of anticoagulants: Secondary | ICD-10-CM | POA: Diagnosis not present

## 2020-08-30 DIAGNOSIS — E1122 Type 2 diabetes mellitus with diabetic chronic kidney disease: Secondary | ICD-10-CM | POA: Insufficient documentation

## 2020-08-30 DIAGNOSIS — Z79899 Other long term (current) drug therapy: Secondary | ICD-10-CM | POA: Diagnosis not present

## 2020-08-30 DIAGNOSIS — I5022 Chronic systolic (congestive) heart failure: Secondary | ICD-10-CM

## 2020-08-30 HISTORY — PX: RIGHT HEART CATH: CATH118263

## 2020-08-30 LAB — POCT I-STAT EG7
Acid-Base Excess: 5 mmol/L — ABNORMAL HIGH (ref 0.0–2.0)
Acid-Base Excess: 4 mmol/L — ABNORMAL HIGH (ref 0.0–2.0)
Bicarbonate: 31.6 mmol/L — ABNORMAL HIGH (ref 20.0–28.0)
Bicarbonate: 30.9 mmol/L — ABNORMAL HIGH (ref 20.0–28.0)
Calcium, Ion: 1.27 mmol/L (ref 1.15–1.40)
Calcium, Ion: 1.22 mmol/L (ref 1.15–1.40)
HCT: 30 % — ABNORMAL LOW (ref 36.0–46.0)
HCT: 30 % — ABNORMAL LOW (ref 36.0–46.0)
Hemoglobin: 10.2 g/dL — ABNORMAL LOW (ref 12.0–15.0)
Hemoglobin: 10.2 g/dL — ABNORMAL LOW (ref 12.0–15.0)
O2 Saturation: 68 %
O2 Saturation: 69 %
Potassium: 3.8 mmol/L (ref 3.5–5.1)
Potassium: 3.7 mmol/L (ref 3.5–5.1)
Sodium: 144 mmol/L (ref 135–145)
Sodium: 144 mmol/L (ref 135–145)
TCO2: 33 mmol/L — ABNORMAL HIGH (ref 22–32)
TCO2: 33 mmol/L — ABNORMAL HIGH (ref 22–32)
pCO2, Ven: 58 mmHg (ref 44.0–60.0)
pCO2, Ven: 57.1 mmHg (ref 44.0–60.0)
pH, Ven: 7.344 (ref 7.250–7.430)
pH, Ven: 7.341 (ref 7.250–7.430)
pO2, Ven: 39 mmHg (ref 32.0–45.0)
pO2, Ven: 39 mmHg (ref 32.0–45.0)

## 2020-08-30 LAB — GLUCOSE, CAPILLARY: Glucose-Capillary: 118 mg/dL — ABNORMAL HIGH (ref 70–99)

## 2020-08-30 SURGERY — RIGHT HEART CATH
Anesthesia: LOCAL

## 2020-08-30 MED ORDER — LIDOCAINE HCL (PF) 1 % IJ SOLN
INTRAMUSCULAR | Status: AC
  Filled 2020-08-30: qty 30

## 2020-08-30 MED ORDER — SODIUM CHLORIDE 0.9 % IV SOLN: INTRAVENOUS | Status: DC

## 2020-08-30 MED ORDER — HEPARIN (PORCINE) IN NACL 1000-0.9 UT/500ML-% IV SOLN
INTRAVENOUS | Status: AC
  Filled 2020-08-30: qty 500

## 2020-08-30 MED ORDER — LIDOCAINE HCL (PF) 1 % IJ SOLN
INTRAMUSCULAR | Status: DC | PRN
  Administered 2020-08-30: 2 mL

## 2020-08-30 MED ORDER — SODIUM CHLORIDE 0.9 % IV SOLN: 250.0000 mL | INTRAVENOUS | Status: DC | PRN

## 2020-08-30 MED ORDER — SODIUM CHLORIDE 0.9% FLUSH: 3.0000 mL | Freq: Two times a day (BID) | INTRAVENOUS | Status: DC

## 2020-08-30 MED ORDER — HEPARIN (PORCINE) IN NACL 1000-0.9 UT/500ML-% IV SOLN
INTRAVENOUS | Status: DC | PRN
  Administered 2020-08-30: 500 mL

## 2020-08-30 MED ORDER — SODIUM CHLORIDE 0.9% FLUSH: 3.0000 mL | INTRAVENOUS | Status: DC | PRN

## 2020-08-30 SURGICAL SUPPLY — 6 items
CATH BALLN WEDGE 5F 110CM (CATHETERS) ×2 IMPLANT
KIT HEART LEFT (KITS) ×2 IMPLANT
PACK CARDIAC CATHETERIZATION (CUSTOM PROCEDURE TRAY) ×2 IMPLANT
SHEATH GLIDE SLENDER 4/5FR (SHEATH) ×2 IMPLANT
TRANSDUCER W/STOPCOCK (MISCELLANEOUS) ×2 IMPLANT
WIRE EMERALD 3MM-J .025X260CM (WIRE) ×2 IMPLANT

## 2020-08-30 NOTE — Interval H&P Note (Signed)
History and Physical Interval Note:  08/30/2020 10:49 AM  Debra Saunders  has presented today for surgery, with the diagnosis of CHF.  The various methods of treatment have been discussed with the patient and family. After consideration of risks, benefits and other options for treatment, the patient has consented to  Procedure(s): RIGHT HEART CATH (N/A) as a surgical intervention.  The patient's history has been reviewed, patient examined, no change in status, stable for surgery.  I have reviewed the patient's chart and labs.  Questions were answered to the patient's satisfaction.     Nena Hampe [employer]

## 2020-08-30 NOTE — Progress Notes (Signed)
Removed left arm NSL site U cath intact.

## 2020-08-30 NOTE — Discharge Instructions (Signed)
Venogram A venogram, or venography, is a procedure that uses an X-ray and dye (contrast) to examine how well the veins work and how blood flows through them. Contrast helps the veins show up on X-rays. A venogram may be done:  To evaluate abnormalities in the vein.  To identify clots within veins, such as deep vein thrombosis (DVT).  To map out the veins that might be needed for another procedure. Tell a health care provider about:  Any allergies you have, especially to medicines, shellfish, iodine, and contrast.  All medicines you are taking, including vitamins, herbs, eye drops, creams, and over-the-counter medicines.  Any problems you or family members have had with anesthetic medicines.  Any blood disorders you have.  Any surgeries you have had and any complications that occurred.  Any medical conditions you have.  Whether you are pregnant, may be pregnant, or are breastfeeding.  Any history of smoking or tobacco use. What are the risks? Generally, this is a safe procedure. However, problems may occur, including:  Infection.  Bleeding.  Blood clots.  Allergic reaction to medicines or contrast.  Damage to other structures or organs.  Kidney problems.  Increased risk of cancer. Being exposed to too much radiation over a lifetime can increase the risk of cancer. The risk is small. What happens before the procedure? Medicines Ask your health care provider about:  Changing or stopping your regular medicines. This is especially important if you are taking diabetes medicines or blood thinners.  Taking medicines such as aspirin and ibuprofen. These medicines can thin your blood. Do not take these medicines unless your health care provider tells you to take them.  Taking over-the-counter medicines, vitamins, herbs, and supplements. General instructions  Follow instructions from your health care provider about eating or drinking restrictions.  You may have blood tests  to check how well your kidneys and liver are working and how well your blood can clot.  Plan to have someone take you home from the hospital or clinic. What happens during the procedure?  An IV will be inserted into one of your veins.  You may be given a medicine to help you relax (sedative).  You will lie down on an X-ray table. The table may be tilted in different directions during the procedure to help the contrast move throughout your body. Safety straps will keep you secure if the table is tilted.  If veins in your arm or leg will be examined, a band may be wrapped around that arm or leg to keep the veins full of blood. This may cause your arm or leg to feel numb.  The contrast will be injected into your IV. You may have a hot, flushed feeling as it moves throughout your body. You may also have a metallic taste in your mouth. Both of these sensations will go away after the test is complete.  You may be asked to lie in different positions or place your legs or arms in different positions.  At the end of the procedure, you may be given IV fluids to help wash or flush the contrast out of your veins.  The IV will be removed, and pressure will be applied to the IV site to prevent bleeding. A bandage (dressing) may be applied to the IV site. The exact procedure may vary among health care providers and hospitals.   What can I expect after the procedure?  Your blood pressure, heart rate, breathing rate, and blood oxygen level will be monitored until   you leave the hospital or clinic.  You may be given something to eat and drink.  You may have bruising or mild discomfort in the area where the IV was inserted. Follow these instructions at home: Eating and drinking  Follow instructions from your health care provider about eating or drinking restrictions.  Drink a lot of water for the first several days after the procedure, as directed by your health care provider. This helps to flush the  contrast out of your body.   Activity  Rest as told by your health care provider.  Return to your normal activities as told by your health care provider. Ask your health care provider what activities are safe for you.  If you were given a sedative during your procedure, do not drive for 24 hours or until your health care provider approves. General instructions  Check your IV insertion area every day for signs of infection. Check for: ? Redness, swelling, or pain. ? Fluid or blood. ? Warmth. ? Pus or a bad smell.  Take over-the-counter and prescription medicines only as told by your health care provider.  Keep all follow-up visits as told by your health care provider. This is important. Contact a health care provider if:  Your skin becomes itchy or you develop a rash or hives.  You have a fever that does not get better with medicine.  You feel nauseous or you vomit.  You have redness, swelling, or pain around the insertion site.  You have fluid or blood coming from the insertion site.  Your insertion area feels warm to the touch.  You have pus or a bad smell coming from the insertion site. Get help right away if you:  Have shortness of breath or difficulty breathing.  Develop chest pain.  Faint.  Feel very dizzy. These symptoms may represent a serious problem that is an emergency. Do not wait to see if the symptoms will go away. Get medical help right away. Call your local emergency services (911 in the [place] Do not drive yourself to the hospital. Summary  A venogram, or venography, is a procedure that uses an X-ray and contrast dye to check how well the veins work and how blood flows through them.  An IV will be inserted into one of your veins in order to inject the contrast.  During the exam, you will lie on an X-ray table. The table may be tilted in different directions during the procedure to help the contrast move throughout your body. Safety straps will keep  you secure.  After the procedure, you will need to drink a lot of water to help wash or flush the contrast out of your body. This information is not intended to replace advice given to you by your health care provider. Make sure you discuss any questions you have with your health care provider. Document Revised: 01/15/2019 Document Reviewed: 01/15/2019 Elsevier Patient Education  2021 Elsevier Inc.  

## 2020-08-31 ENCOUNTER — Encounter (HOSPITAL_COMMUNITY): Payer: Self-pay | Admitting: Cardiology

## 2020-09-03 ENCOUNTER — Other Ambulatory Visit (HOSPITAL_COMMUNITY): Payer: Self-pay | Admitting: *Deleted

## 2020-09-03 ENCOUNTER — Other Ambulatory Visit (HOSPITAL_COMMUNITY): Payer: Self-pay

## 2020-09-03 ENCOUNTER — Encounter (HOSPITAL_COMMUNITY): Payer: Self-pay

## 2020-09-03 ENCOUNTER — Ambulatory Visit: Payer: Medicare HMO

## 2020-09-03 ENCOUNTER — Ambulatory Visit: Admission: RE | Admit: 2020-09-03 | Payer: Medicare HMO | Source: Ambulatory Visit

## 2020-09-03 MED ORDER — DIGOXIN 125 MCG PO TABS: 125.0000 ug | ORAL_TABLET | Freq: Every day | ORAL | 3 refills | Status: DC

## 2020-09-03 NOTE — Progress Notes (Signed)
Today had a home visit with [name].  She states missed her CT scan because transportation never showed.  She will get daughter to reschedule for a day she can take her.   She still having pain in chest going to her back.  She states having some diarrhea even taking the medications.  She has all her medications and has been taking them.  Refilled her boxes.  Called HF clinic and got refill sent for digoxin.  She states been eating too much, she has made a cake and cupcakes.  She tries to eat low sodium foods but cheats some.  Her sleep is still off, hard to go to sleep at night.  She is ambulating with rollator.  She wears her oxygen 24 hrs a day.  She denies increased shortness of breath.  She is aware to contact pulmonogy for appt.  Her daughter showed and her daughter is aware also.  Mood is good.  Her grandson is there during the day working with his job.  She denies any new pain or problems.  She stays active in the home.  She does her own cooking, cleaning and laundry.  Will continue to visit for heart failure.   Elida [phone]

## 2020-09-04 ENCOUNTER — Other Ambulatory Visit: Payer: Self-pay

## 2020-09-04 NOTE — Patient Outreach (Addendum)
[facility]) Care Management  09/04/2020  Debra Saunders 1946/02/20 [id number]   Telephone call to patient for disease management follow up.   No answer.  Unable to leave a message.   Plan: If no return call, RN CM will attempt patient again in the month of May.  [name], RN, MSN [place] Management Care Management Coordinator Direct Line [phone] Cell [phone] Toll Free: [phone]  Fax: [phone]

## 2020-09-05 DIAGNOSIS — H2511 Age-related nuclear cataract, right eye: Secondary | ICD-10-CM | POA: Diagnosis not present

## 2020-09-05 NOTE — Progress Notes (Signed)
Remote pacemaker transmission.   

## 2020-09-06 ENCOUNTER — Other Ambulatory Visit: Payer: Self-pay | Admitting: Gastroenterology

## 2020-09-06 ENCOUNTER — Ambulatory Visit: Payer: Self-pay

## 2020-09-06 DIAGNOSIS — R1011 Right upper quadrant pain: Secondary | ICD-10-CM

## 2020-09-12 ENCOUNTER — Other Ambulatory Visit: Payer: Self-pay

## 2020-09-12 ENCOUNTER — Encounter: Payer: Self-pay | Admitting: Ophthalmology

## 2020-09-12 DIAGNOSIS — R55 Syncope and collapse: Secondary | ICD-10-CM | POA: Diagnosis not present

## 2020-09-12 NOTE — Addendum Note (Signed)
Encounter addended by: Micki Riley, RN on: 09/12/2020 10:31 AM  Actions taken: Imaging Exam ended

## 2020-09-13 ENCOUNTER — Telehealth: Payer: Self-pay | Admitting: Family Medicine

## 2020-09-13 DIAGNOSIS — K766 Portal hypertension: Secondary | ICD-10-CM | POA: Insufficient documentation

## 2020-09-13 DIAGNOSIS — I2721 Secondary pulmonary arterial hypertension: Secondary | ICD-10-CM | POA: Insufficient documentation

## 2020-09-13 DIAGNOSIS — J849 Interstitial pulmonary disease, unspecified: Secondary | ICD-10-CM

## 2020-09-13 NOTE — Telephone Encounter (Signed)
-----   Message from Lesly Rubenstein, MD sent at 09/12/2020  2:18 PM EDT ----- Regarding: RE: Pulmonology Yes, that would be great. Either one is fine. It doesn't matter, just as long as she can be seen.  Thanks,  Armed forces technical officer ----- Message ----- From: [name], MD Sent: 09/07/2020   1:06 PM EDT To: Lesly Rubenstein, MD Subject: RE: Pulmonology                                Marena Chancy, I typically have patients see [facility] pulmonology though I would be fine with her seeing [facility] as well. Do you need me to place a referral for her?  Randall Hiss ----- Message ----- From: Lesly Rubenstein, MD Sent: 09/06/2020   1:04 PM EDT To: [name], MD Subject: Pulmonology                                    Lyna Poser, I hope you are doing well. I saw Ms. Solorzano today, her diarrhea is a little better but still significant. Before increasing her cholestyramine I'm going to get a CT abdomen for this vague right abdomen/flank pain. Her pulmonology appointment in [place] fell through so she was wanting one here in [place]. Did you have a preference on who she sees?  Thanks,  Tomasita Crumble

## 2020-09-13 NOTE — Telephone Encounter (Signed)
Pulm referral placed.

## 2020-09-13 NOTE — Anesthesia Preprocedure Evaluation (Addendum)
Anesthesia Evaluation    Airway Mallampati: II  TM Distance: >3 FB     Dental   Pulmonary sleep apnea , COPD,  oxygen dependent, former smoker,  Interstitial lung disease   Pulmonary exam normal        Cardiovascular hypertension, +CHF  + pacemaker  Rhythm:Regular Rate:Normal  RHC 08/30/20 1. Low/normal filling pressures.  2. Very mild pulmonary hypertension.  3. Preserved cardiac output.   TTE 09/12/19 - EF 60-65%   Neuro/Psych CVA (small frontal posttraumatic ICH 2014)    GI/Hepatic   Endo/Other  diabetesHypothyroidism   Renal/GU Renal disease     Musculoskeletal  (+) Arthritis ,   Abdominal   Peds  Hematology   Anesthesia Other Findings   Reproductive/Obstetrics                            Anesthesia Physical Anesthesia Plan  ASA: IV  Anesthesia Plan: MAC   Post-op Pain Management:    Induction: Intravenous  PONV Risk Score and Plan: TIVA, Midazolam and Treatment may vary due to age or medical condition  Airway Management Planned: Natural Airway and Nasal Cannula  Additional Equipment:   Intra-op Plan:   Post-operative Plan:   Informed Consent: I have reviewed the patients History and Physical, chart, labs and discussed the procedure including the risks, benefits and alternatives for the proposed anesthesia with the patient or authorized representative who has indicated his/her understanding and acceptance.       Plan Discussed with: CRNA  Anesthesia Plan Comments:         Anesthesia Quick Evaluation

## 2020-09-17 ENCOUNTER — Other Ambulatory Visit: Payer: Self-pay

## 2020-09-17 ENCOUNTER — Other Ambulatory Visit
Admission: RE | Admit: 2020-09-17 | Discharge: 2020-09-17 | Disposition: A | Discharge status: Home / Self Care | Payer: Medicare HMO | Source: Ambulatory Visit | Attending: Ophthalmology | Admitting: Ophthalmology

## 2020-09-17 DIAGNOSIS — Z20822 Contact with and (suspected) exposure to covid-19: Secondary | ICD-10-CM | POA: Diagnosis not present

## 2020-09-17 DIAGNOSIS — Z01812 Encounter for preprocedural laboratory examination: Secondary | ICD-10-CM | POA: Insufficient documentation

## 2020-09-17 NOTE — Discharge Instructions (Signed)

## 2020-09-18 LAB — SARS CORONAVIRUS 2 (TAT 6-24 HRS): SARS Coronavirus 2: NEGATIVE

## 2020-09-19 ENCOUNTER — Other Ambulatory Visit: Payer: Self-pay

## 2020-09-19 ENCOUNTER — Ambulatory Visit: Payer: Medicare HMO | Admitting: Anesthesiology

## 2020-09-19 ENCOUNTER — Encounter
Admission: RE | Disposition: A | Discharge status: Home / Self Care | Payer: Self-pay | Source: Home / Self Care | Attending: Ophthalmology

## 2020-09-19 ENCOUNTER — Encounter: Payer: Self-pay | Admitting: Ophthalmology

## 2020-09-19 ENCOUNTER — Ambulatory Visit
Admission: RE | Admit: 2020-09-19 | Discharge: 2020-09-19 | Disposition: A | Discharge status: Home / Self Care | Payer: Medicare HMO | Attending: Ophthalmology | Admitting: Ophthalmology

## 2020-09-19 DIAGNOSIS — N186 End stage renal disease: Secondary | ICD-10-CM | POA: Insufficient documentation

## 2020-09-19 DIAGNOSIS — Z79899 Other long term (current) drug therapy: Secondary | ICD-10-CM | POA: Insufficient documentation

## 2020-09-19 DIAGNOSIS — I5042 Chronic combined systolic (congestive) and diastolic (congestive) heart failure: Secondary | ICD-10-CM | POA: Diagnosis not present

## 2020-09-19 DIAGNOSIS — Z9981 Dependence on supplemental oxygen: Secondary | ICD-10-CM | POA: Diagnosis not present

## 2020-09-19 DIAGNOSIS — Z886 Allergy status to analgesic agent status: Secondary | ICD-10-CM | POA: Insufficient documentation

## 2020-09-19 DIAGNOSIS — E1136 Type 2 diabetes mellitus with diabetic cataract: Secondary | ICD-10-CM | POA: Diagnosis not present

## 2020-09-19 DIAGNOSIS — H2511 Age-related nuclear cataract, right eye: Secondary | ICD-10-CM | POA: Insufficient documentation

## 2020-09-19 DIAGNOSIS — Z880 Allergy status to penicillin: Secondary | ICD-10-CM | POA: Insufficient documentation

## 2020-09-19 DIAGNOSIS — Z8673 Personal history of transient ischemic attack (TIA), and cerebral infarction without residual deficits: Secondary | ICD-10-CM | POA: Diagnosis not present

## 2020-09-19 DIAGNOSIS — Z87891 Personal history of nicotine dependence: Secondary | ICD-10-CM | POA: Insufficient documentation

## 2020-09-19 DIAGNOSIS — H25811 Combined forms of age-related cataract, right eye: Secondary | ICD-10-CM | POA: Diagnosis not present

## 2020-09-19 DIAGNOSIS — I132 Hypertensive heart and chronic kidney disease with heart failure and with stage 5 chronic kidney disease, or end stage renal disease: Secondary | ICD-10-CM | POA: Diagnosis not present

## 2020-09-19 DIAGNOSIS — Z7901 Long term (current) use of anticoagulants: Secondary | ICD-10-CM | POA: Insufficient documentation

## 2020-09-19 DIAGNOSIS — Z950 Presence of cardiac pacemaker: Secondary | ICD-10-CM | POA: Diagnosis not present

## 2020-09-19 HISTORY — PX: CATARACT EXTRACTION W/PHACO: SHX586

## 2020-09-19 LAB — GLUCOSE, CAPILLARY: Glucose-Capillary: 103 mg/dL — ABNORMAL HIGH (ref 70–99)

## 2020-09-19 SURGERY — PHACOEMULSIFICATION, CATARACT, WITH IOL INSERTION
Anesthesia: Monitor Anesthesia Care | Site: Eye | Laterality: Right

## 2020-09-19 MED ORDER — BRIMONIDINE TARTRATE-TIMOLOL 0.2-0.5 % OP SOLN
OPHTHALMIC | Status: DC | PRN
  Administered 2020-09-19: 1 [drp] via OPHTHALMIC

## 2020-09-19 MED ORDER — TETRACAINE HCL 0.5 % OP SOLN
1.0000 [drp] | OPHTHALMIC | Status: DC | PRN
  Administered 2020-09-19 (×3): 1 [drp] via OPHTHALMIC

## 2020-09-19 MED ORDER — ONDANSETRON HCL 4 MG/2ML IJ SOLN: 4.0000 mg | Freq: Once | INTRAMUSCULAR | Status: DC | PRN

## 2020-09-19 MED ORDER — FENTANYL CITRATE (PF) 100 MCG/2ML IJ SOLN
INTRAMUSCULAR | Status: DC | PRN
  Administered 2020-09-19: 50 ug via INTRAVENOUS

## 2020-09-19 MED ORDER — ACETAMINOPHEN 160 MG/5ML PO SOLN: 325.0000 mg | ORAL | Status: DC | PRN

## 2020-09-19 MED ORDER — NA HYALUR & NA CHOND-NA HYALUR 0.4-0.35 ML IO KIT
PACK | INTRAOCULAR | Status: DC | PRN
  Administered 2020-09-19: 1 mL via INTRAOCULAR

## 2020-09-19 MED ORDER — LIDOCAINE HCL (PF) 2 % IJ SOLN
INTRAOCULAR | Status: DC | PRN
  Administered 2020-09-19: 2 mL

## 2020-09-19 MED ORDER — MIDAZOLAM HCL 2 MG/2ML IJ SOLN
INTRAMUSCULAR | Status: DC | PRN
  Administered 2020-09-19: 1 mg via INTRAVENOUS

## 2020-09-19 MED ORDER — ARMC OPHTHALMIC DILATING DROPS
1.0000 "application " | OPHTHALMIC | Status: DC | PRN
  Administered 2020-09-19 (×3): 1 via OPHTHALMIC

## 2020-09-19 MED ORDER — MOXIFLOXACIN HCL 0.5 % OP SOLN
OPHTHALMIC | Status: DC | PRN
  Administered 2020-09-19: 0.2 mL via OPHTHALMIC

## 2020-09-19 MED ORDER — ACETAMINOPHEN 325 MG PO TABS: 325.0000 mg | ORAL_TABLET | ORAL | Status: DC | PRN

## 2020-09-19 MED ORDER — EPINEPHRINE PF 1 MG/ML IJ SOLN
INTRAOCULAR | Status: DC | PRN
  Administered 2020-09-19: 57 mL via OPHTHALMIC

## 2020-09-19 SURGICAL SUPPLY — 18 items
CANNULA ANT/CHMB 27GA (MISCELLANEOUS) ×2 IMPLANT
GLOVE EXAM LX STRL 7.5 (GLOVE) ×2 IMPLANT
GLOVE SURG TRIUMPH 8.0 PF LTX (GLOVE) ×2 IMPLANT
GOWN STRL REUS W/ TWL LRG LVL3 (GOWN DISPOSABLE) ×2 IMPLANT
GOWN STRL REUS W/TWL LRG LVL3 (GOWN DISPOSABLE) ×4
LENS IOL TECNIS EYHANCE 24.5 (Intraocular Lens) ×2 IMPLANT
MARKER SKIN DUAL TIP RULER LAB (MISCELLANEOUS) ×2 IMPLANT
NEEDLE CAPSULORHEX 25GA (NEEDLE) ×2 IMPLANT
NEEDLE FILTER BLUNT 18X 1/2SAF (NEEDLE) ×2
NEEDLE FILTER BLUNT 18X1 1/2 (NEEDLE) ×2 IMPLANT
PACK CATARACT BRASINGTON (MISCELLANEOUS) ×2 IMPLANT
PACK EYE AFTER SURG (MISCELLANEOUS) ×2 IMPLANT
PACK OPTHALMIC (MISCELLANEOUS) ×2 IMPLANT
SOLUTION OPHTHALMIC SALT (MISCELLANEOUS) ×2 IMPLANT
SYR 3ML LL SCALE MARK (SYRINGE) ×4 IMPLANT
SYR TB 1ML LUER SLIP (SYRINGE) ×2 IMPLANT
WATER STERILE IRR 250ML POUR (IV SOLUTION) ×2 IMPLANT
WIPE NON LINTING 3.25X3.25 (MISCELLANEOUS) ×2 IMPLANT

## 2020-09-19 NOTE — H&P (Signed)
[facility]   Primary Care Physician:  [name], MD Ophthalmologist: Dr. Leandrew Koyanagi  Pre-Procedure History & Physical: HPI:  Debra Saunders is a 75 y.o. female here for ophthalmic surgery.   Past Medical History:  Diagnosis Date  . Arthritis   . Atrial fibrillation, persistent ([place])    a. s/p TEE-guided DCCV on 08/20/2017  . Cardiorenal syndrome    a. 09/2017 Creat rose to 2.7 w/ diuresis-->HD x 2.  . Chest pain    a. 01/2012 Cath: nonobs dzs;  c. 04/2014 MV: No ischemia.  . Chronic back pain   . Chronic combined systolic (congestive) and diastolic (congestive) heart failure ([place])    a. Dx 2005 @ [facility];  b. 02/2014 Echo: EF 55-60%; c. 07/2017: EF 30-35%, diffuse HK, trivial AI, severe MR, moderate TR; d. 09/2017 Echo: EF 40-45%; e. 02/2018 Echo: EF 55-60%, Gr1 DD; f. 08/2019 Echo: EF 60-65%, Gr2 DD, Nl RV fxn, RVSP 49.84mmHg. Mod-Sev MR. Mild to mod TR. Mod PR. RA ~ 41mmHg.  [name] Chronic respiratory failure (HCC)    a. on home O2  . COPD (chronic obstructive pulmonary disease) ([place])    a. Home O2 - 3lpm  . Gastritis   . History of intracranial hemorrhage    a. 6/14 described by neurosurgery as "small frontal posttraumatic SAH " - pt was on xarelto @ the time.  . Hyperlipidemia   . Hypertension   . Interstitial lung disease ([place])   . Lipoma of back   . Lymphedema    a. Chronic LLE edema.  [name] NICM (nonischemic cardiomyopathy) ([place])    a. 09/2017 Echo: EF 40-45%; b. 09/2017 Cath: minor, insignificant CAD; c. 02/2018 Echo: EF 55-60%, Gr1 DD; d. 08/2019 Echo: EF 60-65%, Gr2 DD.  [name] Obesity   . PAH (pulmonary artery hypertension) ([place])    a. 09/2017 Echo: PASP 50mmHg; b. 09/2017 RHC: RA 23, RV 84/13, PA 84/29, PCWP 20, CO 3.89, CI 1.89. LVEDP 18.  . Presence of permanent cardiac pacemaker 01/09/2014  . Sepsis with metabolic encephalopathy ([place])   . Sleep apnea   . Streptococcal bacteremia   . Stroke ([place]) 2014  . Subarachnoid hemorrhage ([place]) 01/19/2013  . Symptomatic  bradycardia    a. s/p BSX PPM.  . Thyroid nodule   . Type II diabetes mellitus ([place])   . Urinary incontinence     Past Surgical History:  Procedure Laterality Date  . ABDOMINAL HYSTERECTOMY  1969  . BREAST BIOPSY Right 10/28/2019   lymph node bx, [place] Bx, pending path   . CARDIAC CATHETERIZATION  2013   @ [place]: No obstructive CAD: Only 20% ostial left circumflex.  [name] CARDIOVERSION N/A 10/12/2017   Procedure: CARDIOVERSION;  Surgeon: Minna Merritts, MD;  Location: [facility] ORS;  Service: Cardiovascular;  Laterality: N/A;  . CHOLECYSTECTOMY    . COLONOSCOPY WITH PROPOFOL N/A 09/10/2018   Procedure: COLONOSCOPY WITH PROPOFOL;  Surgeon: Manya Silvas, MD;  Location[facility] ENDOSCOPY;  Service: Endoscopy;  Laterality: N/A;  . CYST REMOVAL TRUNK  2002   BACK  . DIALYSIS/PERMA CATHETER INSERTION N/A 10/06/2017   Procedure: DIALYSIS/PERMA CATHETER INSERTION;  Surgeon: Katha Cabal, MD;  Location: [place] CV LAB;  Service: Cardiovascular;  Laterality: N/A;  . INSERT / REPLACE / REMOVE PACEMAKER    . lipoma removal  2014   back  . PACEMAKER INSERTION  [address] Scientific dual chamber pacemaker implanted by Dr Caryl Comes for symptomatic bradycardia  . PERMANENT PACEMAKER INSERTION  N/A 01/09/2014   Procedure: PERMANENT PACEMAKER INSERTION;  Surgeon: Deboraha Sprang, MD;  Location: [facility] CATH LAB;  Service: Cardiovascular;  Laterality: N/A;  . RIGHT HEART CATH N/A 04/19/2018   Procedure: RIGHT HEART CATH;  Surgeon: Larey Dresser, MD;  Location: [place] CV LAB;  Service: Cardiovascular;  Laterality: N/A;  . RIGHT HEART CATH N/A 08/30/2020   Procedure: RIGHT HEART CATH;  Surgeon: Larey Dresser, MD;  Location: [place] CV LAB;  Service: Cardiovascular;  Laterality: N/A;  . RIGHT/LEFT HEART CATH AND CORONARY ANGIOGRAPHY N/A 10/19/2017   Procedure: RIGHT/LEFT HEART CATH AND CORONARY ANGIOGRAPHY;  Surgeon: Wellington Hampshire, MD;  Location: [place] CV LAB;  Service: Cardiovascular;   Laterality: N/A;  . TEE WITHOUT CARDIOVERSION N/A 08/20/2017   Procedure: TRANSESOPHAGEAL ECHOCARDIOGRAM (TEE) & Direct current cardioversion;  Surgeon: Minna Merritts, MD;  Location: [facility] ORS;  Service: Cardiovascular;  Laterality: N/A;  . TRIGGER FINGER RELEASE      Prior to Admission medications   Medication Sig Start Date End Date Taking? Authorizing Provider  acetaminophen (TYLENOL) 325 MG tablet Take 325-650 mg by mouth every 6 (six) hours as needed for mild pain. Reported on 10/10/2015   Yes [provider]  apixaban (ELIQUIS) 5 MG TABS tablet Take 1 tablet (5 mg total) by mouth 2 (two) times daily. Needs appt for future refills 07/26/20  Yes Wellington Hampshire, MD  cholestyramine Lucrezia Starch) 4 GM/DOSE powder Take 4 g by mouth daily. 08/03/20  Yes [provider]  digoxin (LANOXIN) 0.125 MG tablet Take 1 tablet (125 mcg total) by mouth daily. 09/03/20  Yes Larey Dresser, MD  gabapentin (NEURONTIN) 100 MG capsule TAKE 2 CAPSULES BY MOUTH THREE TIMES DAILY Patient taking differently: Take 200 mg by mouth 3 (three) times daily. 08/13/20  Yes [name], MD  macitentan (OPSUMIT) 10 MG tablet Take 1 tablet (10 mg total) by mouth daily. 06/13/20  Yes Larey Dresser, MD  meclizine (ANTIVERT) 25 MG tablet Take 1 tablet (25 mg total) by mouth 3 (three) times daily as needed for dizziness. 11/04/19  Yes [name], MD  ondansetron (ZOFRAN) 8 MG tablet Take 1 tablet (8 mg total) by mouth every 8 (eight) hours as needed for nausea or vomiting. 09/25/14  Yes Jackolyn Confer, MD  OXYGEN Inhale 4 L into the lungs continuous.   Yes [provider]  potassium chloride SA (KLOR-CON) 20 MEQ tablet Take 1 tablet (20 mEq total) by mouth daily. 06/27/20  Yes Larey Dresser, MD  rosuvastatin (CRESTOR) 10 MG tablet Take 1 tablet (10 mg total) by mouth daily. 05/29/20  Yes Larey Dresser, MD  Selexipag (UPTRAVI) 1600 MCG TABS Take 1,600 mcg by mouth 2 (two) times daily.    Yes [provider]  sildenafil (REVATIO) 20 MG tablet Take 1 tablet (20 mg total) by mouth 3 (three) times daily. 07/20/20  Yes Larey Dresser, MD  spironolactone (ALDACTONE) 25 MG tablet Take 1 tablet (25 mg total) by mouth at bedtime. Please call for a office visit [phone] 04/19/20  Yes Larey Dresser, MD  torsemide (DEMADEX) 20 MG tablet Take 3 tablets (60 mg total) by mouth 2 (two) times daily. Patient taking differently: Take 60 mg by mouth 2 (two) times daily. Per Dr [name] reduce to 40 mg twice daily 08/24/20  Yes Larey Dresser, MD  albuterol (PROVENTIL) (2.5 MG/3ML) 0.083% nebulizer solution Take 3 mLs (2.5 mg total) by nebulization every 6 (  six) hours. Patient taking differently: Take 2.5 mg by nebulization every 6 (six) hours as needed for wheezing or shortness of breath. 08/28/17   Loletha Grayer, MD  glucose blood test strip Use with True Metrix glucometer to check sugars BID 11/14/19   [name], MD  Lancets MISC Use with True Metrix glucometer to check sugars BID 11/14/19   [name], MD  Menthol, Topical Analgesic, (BIOFREEZE EX) Apply 1 application topically 4 (four) times daily as needed (for pain).    [provider]    Allergies as of 07/23/2020 - Review Complete 07/12/2020  Allergen Reaction Noted  . Aspirin Hives and Other (See Comments) 03/06/2014  . Other Other (See Comments) 04/12/2013  . Penicillins Hives, Shortness Of Breath, Swelling, and Other (See Comments) 01/15/2012    Family History  Problem Relation Age of Onset  . Heart disease Mother   . Hypertension Mother   . COPD Mother        was a smoker  . Thyroid disease Mother   . Hypertension Father   . Hypertension Sister   . Thyroid disease Sister   . Breast cancer Sister   . Hypertension Sister   . Thyroid disease Sister   . Bone cancer Sister   . Breast cancer Maternal Aunt   . Kidney disease Neg Hx   . Bladder Cancer Neg Hx     Social History    Socioeconomic History  . Marital status: Divorced    Spouse name: Not on file  . Number of children: 4  . Years of education: 40  . Highest education level: High school graduate  Occupational History  . Occupation: Retired- factory work    Comment: exposed to Pharmacist, community  Tobacco Use  . Smoking status: Former Smoker    Packs/day: 0.30    Years: 2.00    Pack years: 0.60    Types: Cigarettes    Quit date: 06/23/1990    Years since quitting: 30.2  . Smokeless tobacco: Never Used  Vaping Use  . Vaping Use: Never used  Substance and Sexual Activity  . Alcohol use: No  . Drug use: No  . Sexual activity: Not Currently  Other Topics Concern  . Not on file  Social History Narrative   Lives in [place] with daughter. Has 4 children. No pets.      Work - Restaurant manager, fast food, retired      Diet - regular      02/14/2019: reports has Meals On Wheels delivered to her home;       Remote hx of smoking > 30 years; No alcohol-    Social Determinants of Health   Financial Resource Strain: Low Risk   . Difficulty of Paying Living Expenses: Not hard at all  Food Insecurity: No Food Insecurity  . Worried About Charity fundraiser in the Last Year: Never true  . Ran Out of Food in the Last Year: Never true  Transportation Needs: Unmet Transportation Needs  . Lack of Transportation (Medical): Yes  . Lack of Transportation (Non-Medical): Yes  Physical Activity: Not on file  Stress: Not on file  Social Connections: Not on file  Intimate Partner Violence: Not on file    Review of Systems: See HPI, otherwise negative ROS  Physical Exam: BP (!) 145/69   Pulse (!) 59   Temp 97.8 F (36.6 C) (Temporal)   Resp 18   Ht 5' (1.524 m)   Wt 88 kg   SpO2  98%   BMI 37.89 kg/m  General:   Alert,  pleasant and cooperative in NAD Head:  Normocephalic and atraumatic. Lungs: exp wheeze bilat.    Heart:  Regular rate and rhythm.   Impression/Plan: Debra Saunders is here for ophthalmic  surgery.  Risks, benefits, limitations, and alternatives regarding ophthalmic surgery have been reviewed with the patient.  Questions have been answered.  All parties agreeable.   Leandrew Koyanagi, MD  09/19/2020, 1:04 PM

## 2020-09-19 NOTE — Anesthesia Procedure Notes (Signed)
Procedure Name: MAC Date/Time: 09/19/2020 1:16 PM Performed by: Cameron Ali, CRNA Pre-anesthesia Checklist: Patient identified, Emergency Drugs available, Suction available, Timeout performed and Patient being monitored Patient Re-evaluated:Patient Re-evaluated prior to induction Oxygen Delivery Method: Nasal cannula Placement Confirmation: positive ETCO2

## 2020-09-19 NOTE — Anesthesia Postprocedure Evaluation (Signed)
Anesthesia Post Note  Patient: Debra Saunders  Procedure(s) Performed: CATARACT EXTRACTION PHACO AND INTRAOCULAR LENS PLACEMENT (IOC) RIGHT DIABETIC 3.95 00:43.0 9.2% (Right Eye)     Patient location during evaluation: PACU Anesthesia Type: MAC Level of consciousness: awake Pain management: pain level controlled Vital Signs Assessment: post-procedure vital signs reviewed and stable Respiratory status: respiratory function stable Cardiovascular status: stable Postop Assessment: no apparent nausea or vomiting Anesthetic complications: no   No complications documented.  Veda Canning

## 2020-09-19 NOTE — Transfer of Care (Addendum)
Immediate Anesthesia Transfer of Care Note  Patient: Debra Saunders  Procedure(s) Performed: CATARACT EXTRACTION PHACO AND INTRAOCULAR LENS PLACEMENT (IOC) RIGHT DIABETIC 3.95 00:43.0 9.2% (Right Eye)  Patient Location: PACU  Anesthesia Type: MAC  Level of Consciousness: awake, alert  and patient cooperative  Airway and Oxygen Therapy: Patient Spontanous Breathing and Patient connected to supplemental oxygen  Post-op Assessment: Post-op Vital signs reviewed, Patient's Cardiovascular Status Stable, Respiratory Function Stable, Patent Airway and No signs of Nausea or vomiting  Post-op Vital Signs: Reviewed and stable  Complications: No complications documented.

## 2020-09-19 NOTE — Op Note (Signed)
LOCATION:  [facility]   PREOPERATIVE DIAGNOSIS:    Nuclear sclerotic cataract right eye. H25.11   POSTOPERATIVE DIAGNOSIS:  Nuclear sclerotic cataract right eye.     PROCEDURE:  Phacoemusification with posterior chamber intraocular lens placement of the right eye   ULTRASOUND TIME: Procedure(s) with comments: CATARACT EXTRACTION PHACO AND INTRAOCULAR LENS PLACEMENT (IOC) RIGHT DIABETIC 3.95 00:43.0 9.2% (Right) - Pt requests to be last sleep apnea  LENS:   Implant Name Type Inv. Item Serial No. Manufacturer Lot No. LRB No. Used Action  LENS IOL TECNIS EYHANCE 24.5 - [id number] Intraocular Lens LENS IOL TECNIS EYHANCE 24.5 [id number] JOHNSON   Right 1 Implanted         SURGEON:  Wyonia Hough, MD   ANESTHESIA:  Topical with tetracaine drops and 2% Xylocaine jelly, augmented with 1% preservative-free intracameral lidocaine.    COMPLICATIONS:  None.   DESCRIPTION OF PROCEDURE:  The patient was identified in the holding room and transported to the operating room and placed in the supine position under the operating microscope.  The right eye was identified as the operative eye and it was prepped and draped in the usual sterile ophthalmic fashion.   A 1 millimeter clear-corneal paracentesis was made at the 12:00 position.  0.5 ml of preservative-free 1% lidocaine was injected into the anterior chamber. The anterior chamber was filled with Viscoat viscoelastic.  A 2.4 millimeter keratome was used to make a near-clear corneal incision at the 9:00 position.  A curvilinear capsulorrhexis was made with a cystotome and capsulorrhexis forceps.  Balanced salt solution was used to hydrodissect and hydrodelineate the nucleus.   Phacoemulsification was then used in stop and chop fashion to remove the lens nucleus and epinucleus.  The remaining cortex was then removed using the irrigation and aspiration handpiece. Provisc was then placed into the capsular bag to distend it for lens  placement.  A lens was then injected into the capsular bag.  The remaining viscoelastic was aspirated.   Wounds were hydrated with balanced salt solution.  The anterior chamber was inflated to a physiologic pressure with balanced salt solution.  No wound leaks were noted. Vigamox 0.2 ml of a 1mg  per ml solution was injected into the anterior chamber for a dose of 0.2 mg of intracameral antibiotic at the completion of the case.   Timolol and Brimonidine drops were applied to the eye.  The patient was taken to the recovery room in stable condition without complications of anesthesia or surgery.   Charmaine Placido 09/19/2020, 1:35 PM

## 2020-09-20 ENCOUNTER — Encounter: Payer: Self-pay | Admitting: Ophthalmology

## 2020-09-21 ENCOUNTER — Ambulatory Visit: Payer: Medicare HMO

## 2020-09-21 ENCOUNTER — Other Ambulatory Visit (HOSPITAL_COMMUNITY): Payer: Self-pay | Admitting: Cardiology

## 2020-09-24 DIAGNOSIS — H2512 Age-related nuclear cataract, left eye: Secondary | ICD-10-CM | POA: Diagnosis not present

## 2020-09-27 ENCOUNTER — Encounter: Payer: Self-pay | Admitting: Ophthalmology

## 2020-09-27 ENCOUNTER — Other Ambulatory Visit: Payer: Self-pay

## 2020-09-27 ENCOUNTER — Ambulatory Visit (INDEPENDENT_AMBULATORY_CARE_PROVIDER_SITE_OTHER): Payer: Medicare HMO | Admitting: Pharmacist

## 2020-09-27 ENCOUNTER — Telehealth: Payer: Self-pay | Admitting: Licensed Clinical Social Worker

## 2020-09-27 DIAGNOSIS — E1122 Type 2 diabetes mellitus with diabetic chronic kidney disease: Secondary | ICD-10-CM

## 2020-09-27 DIAGNOSIS — I4819 Other persistent atrial fibrillation: Secondary | ICD-10-CM

## 2020-09-27 DIAGNOSIS — J849 Interstitial pulmonary disease, unspecified: Secondary | ICD-10-CM

## 2020-09-27 DIAGNOSIS — I5022 Chronic systolic (congestive) heart failure: Secondary | ICD-10-CM | POA: Diagnosis not present

## 2020-09-27 DIAGNOSIS — E785 Hyperlipidemia, unspecified: Secondary | ICD-10-CM | POA: Diagnosis not present

## 2020-09-27 DIAGNOSIS — I272 Pulmonary hypertension, unspecified: Secondary | ICD-10-CM

## 2020-09-27 NOTE — Patient Instructions (Addendum)
Visit Information  PATIENT GOALS: Goals Addressed              This Visit's Progress     Patient Stated   .  Medication Monitoring (pt-stated)        Patient Goals/Self-Care Activities . Over the next 90 days, patient will:  - take medications as prescribed check blood glucose periodically, document, and provide at future appointments weight daily, and contact provider if weight gain of > 3 lbs in 1 day, >5 lbs in 1 week         Patient verbalizes understanding of instructions provided today and agrees to view in [place].   Plan: Telephone follow up appointment with care management team member scheduled for:  ~ 4 weeks  [name], PharmD, [place], [place] Clinical Pharmacist [employer] at [employer] [phone]

## 2020-09-27 NOTE — Progress Notes (Signed)
Heart and Vascular Care Navigation  09/27/2020  SHAARON GOLLIDAY 1946/06/17 [id number]  Reason for Referral:  Transportation Engaged with patient by telephone for initial visit for Heart and Vascular Care Coordination.                                                                                                   Assessment:   Contacted by [name], PharmD, regarding pt cancellation of appt with AHF clinic to see Dr. [name] tomorrow due to lack of transportation. LCSW offered to connect w/ pt and see if she is interested in utilizing [employer]. LCSW attempted to call pt via mobile number listed first, this is her daughter [name]'s number and [name] directed me to call pt directly at home number to discuss.   I reached pt at [phone]. Introduced self, role, reason for call. Pt confirmed home address, PCP, and emergency contacts. She shares that her daughters take her to appointment and sometimes she utilizes a ride service but that the cost can add up. LCSW shared Transportation Services opportunities should she be going to a [employer] provider. Pt interested in this. Unfortunately I reached out to [facility] and pt previous clinic appt had been taken, however pt can utilize the service for upcoming appointments as needed.       Pt denies any additional concerns at this time, given my name and number for any additional questions.                                  HRT/VAS Care Coordination    Patients Home Cardiology Office [place] Team Social Worker   Community Paramedic Name: [name]- [phone]   Living arrangements for the past 2 months Single Family Home   Lives with: Self   Patient Current Insurance Coverage Managed Medicare   Patient Has Concern With Paying Medical Bills No   Does Patient Have Prescription Coverage? Yes   Home Assistive Devices/Equipment None   HH Agency [facility]       Social History:                                                                             SDOH Screenings   Alcohol Screen: Not on file  Depression (PHQ2-9): Low Risk   . PHQ-2 Score: 0  Financial Resource Strain: Low Risk   . Difficulty of Paying Living Expenses: Not hard at all  Food Insecurity: No Food Insecurity  . Worried About Charity fundraiser in the Last Year: Never true  . Ran Out of Food in the Last Year: Never true  Housing: Low Risk   . Last Housing Risk Score: 0  Physical Activity: Not on file  Social Connections: Not on file  Stress: Not on file  Tobacco Use: Medium Risk  . Smoking Tobacco Use: Former Smoker  . Smokeless Tobacco Use: Never Used  Transportation Needs: Unmet Transportation Needs  . Lack of Transportation (Medical): Yes  . Lack of Transportation (Non-Medical): Yes    SDOH Interventions: Transportation:    [employer]    Follow-up plan:   LCSW sent pt referral through to [employer]. She has my number for any additional questions/concerns at this time. Pt can utilize these services in the future as needed.

## 2020-09-27 NOTE — Chronic Care Management (AMB) (Signed)
Chronic Care Management Pharmacy Note  09/27/2020 Name:  Debra Saunders MRN:  [id number] DOB:  Feb 19, 1946  Subjective: Debra Saunders is an 75 y.o. year old female who is a primary patient of [name], [name], MD.  The CCM team was consulted for assistance with disease management and care coordination needs.    Engaged with patient by telephone for follow up visit in response to provider referral for pharmacy case management and/or care coordination services.   Consent to Services:  The patient was given information about Chronic Care Management services, agreed to services, and gave verbal consent prior to initiation of services.  Please see initial visit note for detailed documentation.   Patient Care Team: [name], MD as PCP - General (Family Medicine) Wellington Hampshire, MD as PCP - Cardiology (Cardiology) Deboraha Sprang, MD as PCP - Electrophysiology (Cardiology) Thom Chimes, MD as Referring Physician (Allergy) Juanito Doom, MD as Referring Physician (Internal Medicine) De Hollingshead, RPH-CPP as Pharmacist (Pharmacist) Cammie Sickle, MD as Consulting Physician (Hematology and Oncology) Jon Billings, RN as [place] Management  Recent office visits: None since our last call  Recent consult visits:  2/21 - EMT visit- reported chest pain, blurred vision, jaw tightness. Declined urgent evaluation. Reported episode of syncope.  3/4 - [facility]  - increased torsemide to 60 mg BID, RHC ordered (completed - results showed low/normal filling pressures, mild pHTN, preseved CO) -> pulm eval (scheduled 4/20 w/ [facility]), Zio patch ordered (completed - no cause for syncope noted)  3/7 - [name] - iron deficiency anemia f/u; given venofer and B12; recommended US abdomen (completed, unremarkable), then recommended CT chest w/ contrast (not completed)  3/17 - Dr. Haig Prophet GI f/u. CT abdomen pelvis w/ contrast ordered.  Scheduled 4/25  3/30 - cataract extraction   Hospital visits: None in previous 6 months  Objective:  Lab Results  Component Value Date   CREATININE 1.00 08/27/2020   CREATININE 1.01 (H) 08/24/2020   CREATININE 1.07 (H) 07/12/2020    Lab Results  Component Value Date   HGBA1C 5.4 04/10/2020   Last diabetic Eye exam:  Lab Results  Component Value Date/Time   HMDIABEYEEXA No Retinopathy 01/13/2020 12:00 AM    Last diabetic Foot exam:  Lab Results  Component Value Date/Time   HMDIABFOOTEX Normal 01/13/2014 12:00 AM        Component Value Date/Time   CHOL 100 02/03/2019 1413   CHOL 137 02/03/2013 0441   TRIG 121.0 02/03/2019 1413   TRIG 197 02/03/2013 0441   HDL 51.00 02/03/2019 1413   HDL 25 (L) 02/03/2013 0441   CHOLHDL 2 02/03/2019 1413   VLDL 24.2 02/03/2019 1413   VLDL 39 02/03/2013 0441   LDLCALC 25 02/03/2019 1413   LDLCALC 73 02/03/2013 0441    Hepatic Function Latest Ref Rng & Units 08/27/2020 04/20/2020 04/10/2020  Total Protein 6.5 - 8.1 g/dL 7.4 - 6.9  Albumin 3.5 - 5.0 g/dL 3.8 - 4.0  AST 15 - 41 U/L 15 - 13  ALT 0 - 44 U/L 12 - 7  Alk Phosphatase 38 - 126 U/L 107 163(H) 133(H)  Total Bilirubin 0.3 - 1.2 mg/dL 0.6 - 0.4  Bilirubin, Direct 0.0 - 0.2 mg/dL 0.1 - -    Lab Results  Component Value Date/Time   TSH 0.98 04/10/2020 01:54 PM   TSH 1.19 02/03/2019 02:13 PM    CBC Latest Ref Rng & Units 08/30/2020 08/30/2020 08/27/2020  WBC 4.0 - 10.5 K/uL - - 5.3  Hemoglobin 12.0 - 15.0 g/dL 10.2(L) 10.2(L) 9.4(L)  Hematocrit 36.0 - 46.0 % 30.0(L) 30.0(L) 31.3(L)  Platelets 150 - 400 K/uL - - 183    Clinical ASCVD: Yes    CHA2DS2-VASc Score = 6  This indicates a 9.7% annual risk of stroke. The patient's score is based upon: CHF History: Yes HTN History: Yes Diabetes History: Yes Stroke History: No Vascular Disease History: No Age Score: 2 Gender Score: 1    Social History   Tobacco Use  Smoking Status Former Smoker  . Packs/day: 0.30   . Years: 2.00  . Pack years: 0.60  . Types: Cigarettes  . Quit date: 06/23/1990  . Years since quitting: 30.2  Smokeless Tobacco Never Used   BP Readings from Last 3 Encounters:  09/19/20 (!) 124/57  08/30/20 (!) 150/78  08/27/20 (!) 145/74   Pulse Readings from Last 3 Encounters:  09/19/20 71  08/30/20 (!) 59  08/27/20 (!) 59   Wt Readings from Last 3 Encounters:  09/19/20 194 lb (88 kg)  09/03/20 195 lb (88.5 kg)  08/30/20 193 lb (87.5 kg)    Assessment: Review of patient past medical history, allergies, medications, health status, including review of consultants reports, laboratory and other test data, was performed as part of comprehensive evaluation and provision of chronic care management services.   SDOH:  (Social Determinants of Health) assessments and interventions performed:  SDOH Interventions   Flowsheet Row Most Recent Value  SDOH Interventions   Transportation Interventions Other (Comment), [facility]      CCM Care Plan  Allergies  Allergen Reactions  . Aspirin Hives and Other (See Comments)    Pt states that she is unable to take because she had bleeding in her brain  . Other Other (See Comments)    Pt states that she is unable to take blood thinners because she had bleeding in her brain; Blood Thinners-per doctor at [facility]  . Penicillins Hives, Shortness Of Breath, Swelling and Other (See Comments)    Has patient had a PCN reaction causing immediate rash, facial/tongue/throat swelling, SOB or lightheadedness with hypotension: Yes Has patient had a PCN reaction causing severe rash involving mucus membranes or skin necrosis: No Has patient had a PCN reaction that required hospitalization No Has patient had a PCN reaction occurring within the last 10 years: No If all of the above answers are "NO", then may proceed with Cephalosporin use.    Medications Reviewed Today    Reviewed by Lady Gary, RN (Registered Nurse)  on 09/27/20 at 1446  Med List Status: <None>  Medication Order Taking? Sig Documenting Provider Last Dose Status Informant  acetaminophen (TYLENOL) 325 MG tablet [id number] No Take 325-650 mg by mouth every 6 (six) hours as needed for mild pain. Reported on 10/10/2015 [provider] Taking Active Self           Med Note Darnelle Maffucci, Arville Lime   Thu Sep 27, 2020  2:22 PM) Taking about every other QPM  albuterol (PROVENTIL) (2.5 MG/3ML) 0.083% nebulizer solution [id number] No Take 3 mLs (2.5 mg total) by nebulization every 6 (six) hours.  Patient not taking: Reported on 09/27/2020   Loletha Grayer, MD Not Taking Active   apixaban (ELIQUIS) 5 MG TABS tablet [id number] No Take 1 tablet (5 mg total) by mouth 2 (two) times daily. Needs appt for future refills Wellington Hampshire, MD Taking Active Self  cholestyramine Lucrezia Starch) 4  GM/DOSE powder [id number] No Take 4 g by mouth daily. [provider] Taking Active Self  digoxin (LANOXIN) 0.125 MG tablet [id number] No Take 1 tablet (125 mcg total) by mouth daily. Larey Dresser, MD Taking Active   gabapentin (NEURONTIN) 100 MG capsule [id number] No TAKE 2 CAPSULES BY MOUTH THREE TIMES DAILY  Patient taking differently: Take 200 mg by mouth 3 (three) times daily.   [name], MD Taking Active   glucose blood test strip [id number] No Use with True Metrix glucometer to check sugars BID [name], MD Taking Active Self  KLOR-CON M20 20 MEQ tablet [id number] No Take 1 tablet by mouth once daily Larey Dresser, MD Taking Active   Lancets MISC [id number] No Use with True Metrix glucometer to check sugars BID [name], MD Taking Active Self  macitentan (OPSUMIT) 10 MG tablet [id number] No Take 1 tablet (10 mg total) by mouth daily. Larey Dresser, MD Taking Active Self  meclizine (ANTIVERT) 25 MG tablet [id number] No Take 1 tablet (25 mg total) by mouth 3 (three) times daily as needed for dizziness.  Patient not taking: Reported  on 09/27/2020   [name], MD Not Taking Active Self  Menthol, Topical Analgesic, [facility] EX) [id number] No Apply 1 application topically 4 (four) times daily as needed (for pain). [provider] Taking Active Self  ondansetron (ZOFRAN) 8 MG tablet [id number] No Take 1 tablet (8 mg total) by mouth every 8 (eight) hours as needed for nausea or vomiting.  Patient not taking: Reported on 09/27/2020   Jackolyn Confer, MD Not Taking Active Self           Med Note Cyndra Numbers Aug 16, 2017  9:43 AM)    OXYGEN [id number] No Inhale 4 L into the lungs continuous. [provider] Taking Active Self  rosuvastatin (CRESTOR) 10 MG tablet [id number] No Take 1 tablet (10 mg total) by mouth daily. Larey Dresser, MD Taking Active Self  Selexipag (UPTRAVI) 1600 MCG TABS [id number] No Take 1,600 mcg by mouth 2 (two) times daily. [provider] Taking Active Self  sildenafil (REVATIO) 20 MG tablet [id number] No Take 1 tablet (20 mg total) by mouth 3 (three) times daily. Larey Dresser, MD Taking Active Self  spironolactone (ALDACTONE) 25 MG tablet [id number] No Take 1 tablet (25 mg total) by mouth at bedtime. Please call for a office visit [phone] Larey Dresser, MD Taking Active Self  torsemide (DEMADEX) 20 MG tablet [id number] No Take 3 tablets (60 mg total) by mouth 2 (two) times daily.  Patient taking differently: Take 60 mg by mouth 2 (two) times daily. Per Dr [name] reduce to 40 mg twice daily   Larey Dresser, MD Taking Active           Patient Active Problem List   Diagnosis Date Noted  . Burn 04/30/2020  . Obesity (BMI 30-39.9) 04/30/2020  . Elevated alkaline phosphatase level 04/30/2020  . Chronic diarrhea 11/04/2019  . Dysuria 09/29/2019  . Abnormal mammogram 09/29/2019  . Breast pain, right 08/08/2019  . Bilateral ovarian cysts 08/08/2019  . B12 deficiency 08/02/2019  . Iron deficiency 08/01/2019  . Dizziness 07/06/2019  . Shortness of  breath   . Atrial fibrillation ([place]) 09/22/2017  . Acute bilateral low back pain without sciatica 01/28/2017  . Pain in limb 07/22/2016  . Neuropathy 06/09/2016  . Pain in the chest 03/10/2016  . Chronic deep vein thrombosis (DVT) (  [place]) 03/03/2016  . Chronic back pain 10/19/2015  . Goiter 04/25/2015  . Cervical disc disorder with radiculopathy of cervical region 03/30/2015  . Elevated LFTs 06/29/2014  . 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 ILD (interstitial lung disease) ([place]) 04/06/2014  . Abnormality of gait 01/13/2014  . Sinoatrial node dysfunction ([place]) 01/09/2014  . Allergic rhinitis 10/26/2013  . Lymphedema 08/03/2013  . Constipation 04/12/2013  . Musculoskeletal chest pain 01/19/2013  . Fatigue 09/27/2012  . Anemia 04/28/2012  . Chronic respiratory failure with hypoxia ([place]) 04/26/2012  . Diabetes ([place]) 03/30/2012  . Pulmonary hypertension, unspecified ([place]) 03/30/2012  . Hyperlipidemia 03/30/2012  . Hypothyroidism 03/30/2012  . Chronic systolic heart failure ([place]) 01/22/2012  . Hypertension     Immunization History  Administered Date(s) Administered  . Fluad Quad(high Dose 65+) 07/08/2019, 04/10/2020  . Influenza, High Dose Seasonal PF 07/23/2016, 06/15/2017, 07/02/2018  . Influenza, Seasonal, Injecte, Preservative Fre 07/27/2012  . Influenza,inj,Quad PF,6+ Mos 06/10/2013, 05/01/2014, 03/19/2015  . PFIZER(Purple Top)SARS-COV-2 Vaccination 09/23/2019, 10/17/2019  . Pneumococcal Conjugate-13 10/06/2013  . Pneumococcal Polysaccharide-23 04/28/2012  . Td 08/03/2013    Conditions to be addressed/monitored: Atrial Fibrillation, CHF, HTN, HLD and DMII  Care Plan : Medication Management  Updates made by De Hollingshead, RPH-CPP since 09/27/2020 12:00 AM    Problem: T2DM, HFpEF, pulmonary HTN, diarrhea     Long-Range Goal: Disease Progression Prevention   This Visit's Progress: On track  Recent Progress: On track  Priority: High  Note:   Current  Barriers:  . Polypharmacy, complex patient with multiple comorbidities   Pharmacist Clinical Goal(s):  [name] Over the next 90 days, patient will work with PharmD and provider towards optimized glycemic control . Over the next 90 days, patient will work with PharmD and provider towards optimized medication management  Interventions: . 1:1 collaboration with [name], MD regarding development and update of comprehensive plan of care as evidenced by provider attestation and co-signature . Inter-disciplinary care team collaboration (see longitudinal plan of care) . Comprehensive medication review performed; medication list updated in electronic medical record  SDOH: . Transportation is a significant barrier for this patient. Her daughter, [name], can sometimes take her to appointments but not always. Hem/Onc has transportation that comes and picks her up prior to appointments w/ Dr. Pearletha Furl infusions. She notes she has used up her insurance transportation benefits and it gets expensive to continue to schedule [facility] ($6 per way, so $12 per appointment) for her multiple appointments. Placed Care Guide referral for support w/ transportation. Will also collaborate w/ HF LCSW for support  Diabetes: . Diet controlled. o Previous hx [name] (d/c d/t significant GI upset with recent restart) . Very concerned about weight gain today. Asks about going back on [name] to help prevent weight gain.  . Discussed fluid related weight gain vs dietary/carbohydrate related weight gain. Patient seems most concerned about weight fluctuations related to changing diuretic dosage. See below.  . Reviewed that we do not need to restart diabetic therapy unless A1c trends back up. Patient verbalized understanding.   Hypertension, Heart Failure, Pulmonary Hypertension . Managed by [facility], Cardiology; Current treatment: digoxin 0.125 mg daily, Opsumit 10 mg daily, potassium 20 mEq  daily, Uptravi 1600 mcg BID, sildenafil citrate 20 mg TID, spironolactone 25 mg daily, torsemide 40 mg QAM, 40 mg QPM - per chart, Dr. [name] had increased to 60 mg BID, but patient reports today that "he said to reduce to 40 mg twice daily, but I am  still doing 60 mg twice daily several days a week". I am unable to find documentation that she was instructed to reduce torsemide back to 40 mg BID. She also did not have BMP performed ~ 10 days after torsemide dose was increased.  . Per chart review, [place] funding for pulmonary HTN fund approved through HF clinic. Reports that Eliquis is affordvable when she pays through her deductible. . Denies any further episodes of blacking out since the one last reported to EMT last month. . Reports increase in weight. Weight this morning 194.6 lbs, yesterday 192 lbs. Weight was 192 lbs at last CHF clinic visit. Plans to take 60 mg BID today . Recent RHC (low/normal filling pressures, mild pHTN, preseved CO). Pulmonary referral was suggested for management of SOB. Patient scheduled w/ [facility] on 10/10/20.  Elwyn Reach patch showed no reasons for a syncopal/presyncopal episode.  . Was supposed to see Dr. [name] tomorrow for 3 week follow up, but she didn't have transportation so she rescheduled for the next available time (unclear if Dr. Claris Gladden next available or the next time her daughter could take her) for June.  . Will outreach Dr. [name] today to discuss diuretic dosing, need for repeat BMP s/p torsemide dose adjustment.  Hyperlipidemia: . Controlled per last lipid panel; current treatment: rosuvastatin 10 mg daily . Antiplatelet regimen: none d/t concurrent anticoagulation . Recommended continue current regimen  Atrial Fibrillation: . Appropriately managed; rhythm control: digoxin 0.125 mg daily; anticoagulant treatment: Eliquis 5 mg BID . Eliquis dosing remains appropriate given age, weight, Scr.  . Last digoxin level WNL . Recommended  continue current regimen  along with cardiology collaboration.   Chronic Diarrhea: . Improved, current treatment cholestyramine 4 g daily. Recent visit w/ Dr. Haig Prophet. Was to have CT abdomen pelvis w/ contrast, had to r/s multiple times due to transportation. Scheduled for 4/25. Discussed collaboration between [place], Rogue Bussing to have CT chest and abdomen pelvis completed at the same time w/ PCP. Will outreach these physicians.  . Meclizine PRN dizziness/nausea, ondansetron PRN nausea/vomiting. Has not needed as much lately.  . Recommended to continue current treatment at this time along with GI collaboration.   Anemia: . Managed by hematology for iron infusions. Follows w/ [name]. Upcoming appointment in May, transportation scheduled  . Encouraged to continue collaboration with hematology.   . [name] had ordered stat CT chest with contrast. This has not been completed. Discussed collaboration between [place], Rogue Bussing to have CT chest and abdomen pelvis completed at the same time w/ PCP. Will outreach these physicians.   Patient Goals/Self-Care Activities . Over the next 90 days, patient will:  - take medications as prescribed check blood glucose periodically, document, and provide at future appointments weight daily, and contact provider if weight gain of > 3 lbs in 1 day, >5 lbs in 1 week  Follow Up Plan: Telephone follow up appointment with care management team member scheduled for: ~ 8 weeks       Medication Assistance: [employer] funding for pulmonary HTN medications coordinated by HF clinic  Patient's preferred pharmacy is:  [facility] [address]), [place] ([place]) [place] [address] Phone: [phone] Fax: [phone]  Uses pill box? Yes Pt endorses 100% compliance  Follow Up:  Patient agrees to Care Plan and Follow-up.  Plan: Telephone follow up appointment with care  management team member scheduled for:  ~ 4 weeks  [name], PharmD, [place], CPP  Contractor at [place]

## 2020-09-28 ENCOUNTER — Encounter (HOSPITAL_COMMUNITY): Payer: Medicare HMO | Admitting: Cardiology

## 2020-09-28 ENCOUNTER — Telehealth: Payer: Self-pay | Admitting: Licensed Clinical Social Worker

## 2020-09-28 NOTE — Telephone Encounter (Addendum)
LCSW received a call from pt [phone]), she wants to clarify her appointment was indeed canceled w/ Dr. [name] today, I was able to confirm that and let her know about rescheduled 6/17 date. Pt aware she has been enrolled in transportation and will be mailed a card she can call and utilize to get to any [facility] appointment she needs. I will make a note to f/u with her prior to 6/17 appt, I have mailed her my card as well if she should need any additional assistance prior to that appointment.   Westley Hummer, MSW, [place]  [phone]

## 2020-10-01 ENCOUNTER — Other Ambulatory Visit: Payer: Medicare HMO

## 2020-10-02 ENCOUNTER — Other Ambulatory Visit (HOSPITAL_COMMUNITY): Payer: Self-pay

## 2020-10-02 ENCOUNTER — Telehealth: Payer: Self-pay

## 2020-10-02 NOTE — Progress Notes (Signed)
Today had a home visit with Debra Saunders.  She states doing ok.  She has been cooking and eating more.  She has been watching her high sodium foods and watching her fluids.  She has been going to several doctor appts.  She is getting assistance with transportation with different offices.  She missed her cardiology appt but rescheduled it.  She denies any chest pain, headaches, dizziness or increased shortness of breath.  Since her eye surgery she has had some pain in her head above eye. She is getting her second eye surgery tomorrow.  She has all her medications and refilled 2 weeks of meds for her.  It appears she has been taking them.  She is weighing daily.  She is aware of up coming appts.  She has some edema in her legs.  She is able to speak in full sentences without shortness of breath.  Will continue to visit for heart failure, diet and medication compliance.   [place] [phone]

## 2020-10-02 NOTE — Telephone Encounter (Signed)
   Telephone encounter was:  Successful.  10/02/2020 Name: Debra Saunders MRN: [id number] DOB: December 31, 1945  [name] is a 75 y.o. year old female who is a primary care patient of [name], [name], MD . The community resource team was consulted for assistance with Transportation Needs   Care guide performed the following interventions: Patient provided with information about care guide support team and interviewed to confirm resource needs Spoke with patient about [place] transportation for appointments. Called [employer] spoke with [place] patient is set-up for future appointments. Rides are free through June 2022 and patient is set up on grants and is not charged for rides.  No further assistance needed at this time.  Follow Up Plan:  No further follow up planned at this time. The patient has been provided with needed resources.  [name], AAS Paralegal, [place] . Embedded Care Coordination [facility]  Care Management  [address], [place] [address] ??[email or web address]  ?? [phone]   [email or web address]

## 2020-10-02 NOTE — Discharge Instructions (Signed)

## 2020-10-03 ENCOUNTER — Other Ambulatory Visit: Payer: Self-pay

## 2020-10-03 ENCOUNTER — Encounter: Payer: Self-pay | Admitting: Ophthalmology

## 2020-10-03 ENCOUNTER — Ambulatory Visit
Admission: RE | Admit: 2020-10-03 | Discharge: 2020-10-03 | Disposition: A | Discharge status: Home / Self Care | Payer: Medicare HMO | Attending: Ophthalmology | Admitting: Ophthalmology

## 2020-10-03 ENCOUNTER — Encounter
Admission: RE | Disposition: A | Discharge status: Home / Self Care | Payer: Self-pay | Source: Home / Self Care | Attending: Ophthalmology

## 2020-10-03 ENCOUNTER — Ambulatory Visit: Payer: Medicare HMO | Admitting: Anesthesiology

## 2020-10-03 DIAGNOSIS — Z8673 Personal history of transient ischemic attack (TIA), and cerebral infarction without residual deficits: Secondary | ICD-10-CM | POA: Diagnosis not present

## 2020-10-03 DIAGNOSIS — Z8249 Family history of ischemic heart disease and other diseases of the circulatory system: Secondary | ICD-10-CM | POA: Diagnosis not present

## 2020-10-03 DIAGNOSIS — H2512 Age-related nuclear cataract, left eye: Secondary | ICD-10-CM | POA: Insufficient documentation

## 2020-10-03 DIAGNOSIS — E1122 Type 2 diabetes mellitus with diabetic chronic kidney disease: Secondary | ICD-10-CM | POA: Diagnosis not present

## 2020-10-03 DIAGNOSIS — Z79899 Other long term (current) drug therapy: Secondary | ICD-10-CM | POA: Diagnosis not present

## 2020-10-03 DIAGNOSIS — Z6837 Body mass index (BMI) 37.0-37.9, adult: Secondary | ICD-10-CM | POA: Insufficient documentation

## 2020-10-03 DIAGNOSIS — I5042 Chronic combined systolic (congestive) and diastolic (congestive) heart failure: Secondary | ICD-10-CM | POA: Diagnosis not present

## 2020-10-03 DIAGNOSIS — Z880 Allergy status to penicillin: Secondary | ICD-10-CM | POA: Insufficient documentation

## 2020-10-03 DIAGNOSIS — E785 Hyperlipidemia, unspecified: Secondary | ICD-10-CM | POA: Insufficient documentation

## 2020-10-03 DIAGNOSIS — G4730 Sleep apnea, unspecified: Secondary | ICD-10-CM | POA: Diagnosis not present

## 2020-10-03 DIAGNOSIS — H25812 Combined forms of age-related cataract, left eye: Secondary | ICD-10-CM | POA: Diagnosis not present

## 2020-10-03 DIAGNOSIS — Z7982 Long term (current) use of aspirin: Secondary | ICD-10-CM | POA: Diagnosis not present

## 2020-10-03 DIAGNOSIS — Z886 Allergy status to analgesic agent status: Secondary | ICD-10-CM | POA: Diagnosis not present

## 2020-10-03 DIAGNOSIS — Z950 Presence of cardiac pacemaker: Secondary | ICD-10-CM | POA: Diagnosis not present

## 2020-10-03 DIAGNOSIS — I132 Hypertensive heart and chronic kidney disease with heart failure and with stage 5 chronic kidney disease, or end stage renal disease: Secondary | ICD-10-CM | POA: Insufficient documentation

## 2020-10-03 DIAGNOSIS — I2721 Secondary pulmonary arterial hypertension: Secondary | ICD-10-CM | POA: Diagnosis not present

## 2020-10-03 DIAGNOSIS — Z9049 Acquired absence of other specified parts of digestive tract: Secondary | ICD-10-CM | POA: Insufficient documentation

## 2020-10-03 DIAGNOSIS — I4819 Other persistent atrial fibrillation: Secondary | ICD-10-CM | POA: Diagnosis not present

## 2020-10-03 DIAGNOSIS — E669 Obesity, unspecified: Secondary | ICD-10-CM | POA: Insufficient documentation

## 2020-10-03 DIAGNOSIS — N186 End stage renal disease: Secondary | ICD-10-CM | POA: Diagnosis not present

## 2020-10-03 DIAGNOSIS — E1136 Type 2 diabetes mellitus with diabetic cataract: Secondary | ICD-10-CM | POA: Diagnosis not present

## 2020-10-03 DIAGNOSIS — Z90710 Acquired absence of both cervix and uterus: Secondary | ICD-10-CM | POA: Diagnosis not present

## 2020-10-03 DIAGNOSIS — Z992 Dependence on renal dialysis: Secondary | ICD-10-CM | POA: Diagnosis not present

## 2020-10-03 DIAGNOSIS — Z87891 Personal history of nicotine dependence: Secondary | ICD-10-CM | POA: Diagnosis not present

## 2020-10-03 HISTORY — PX: CATARACT EXTRACTION W/PHACO: SHX586

## 2020-10-03 LAB — GLUCOSE, CAPILLARY
Glucose-Capillary: 133 mg/dL — ABNORMAL HIGH (ref 70–99)
Glucose-Capillary: 121 mg/dL — ABNORMAL HIGH (ref 70–99)

## 2020-10-03 SURGERY — PHACOEMULSIFICATION, CATARACT, WITH IOL INSERTION
Anesthesia: Monitor Anesthesia Care | Site: Eye | Laterality: Left

## 2020-10-03 MED ORDER — NA HYALUR & NA CHOND-NA HYALUR 0.4-0.35 ML IO KIT
PACK | INTRAOCULAR | Status: DC | PRN
  Administered 2020-10-03: 1 mL via INTRAOCULAR

## 2020-10-03 MED ORDER — MOXIFLOXACIN HCL 0.5 % OP SOLN
OPHTHALMIC | Status: DC | PRN
  Administered 2020-10-03: 0.2 mL via OPHTHALMIC

## 2020-10-03 MED ORDER — FENTANYL CITRATE (PF) 100 MCG/2ML IJ SOLN
INTRAMUSCULAR | Status: DC | PRN
  Administered 2020-10-03: 50 ug via INTRAVENOUS

## 2020-10-03 MED ORDER — BRIMONIDINE TARTRATE-TIMOLOL 0.2-0.5 % OP SOLN
OPHTHALMIC | Status: DC | PRN
  Administered 2020-10-03: 1 [drp] via OPHTHALMIC

## 2020-10-03 MED ORDER — MIDAZOLAM HCL 2 MG/2ML IJ SOLN
INTRAMUSCULAR | Status: DC | PRN
  Administered 2020-10-03: 1 mg via INTRAVENOUS

## 2020-10-03 MED ORDER — ARMC OPHTHALMIC DILATING DROPS
1.0000 "application " | OPHTHALMIC | Status: DC | PRN
  Administered 2020-10-03 (×3): 1 via OPHTHALMIC

## 2020-10-03 MED ORDER — EPINEPHRINE PF 1 MG/ML IJ SOLN
INTRAOCULAR | Status: DC | PRN
  Administered 2020-10-03: 71 mL via OPHTHALMIC

## 2020-10-03 MED ORDER — TETRACAINE HCL 0.5 % OP SOLN
1.0000 [drp] | OPHTHALMIC | Status: DC | PRN
  Administered 2020-10-03 (×3): 1 [drp] via OPHTHALMIC

## 2020-10-03 MED ORDER — LIDOCAINE HCL (PF) 2 % IJ SOLN
INTRAOCULAR | Status: DC | PRN
  Administered 2020-10-03: 1 mL

## 2020-10-03 SURGICAL SUPPLY — 23 items
CANNULA ANT/CHMB 27GA (MISCELLANEOUS) ×2 IMPLANT
GLOVE SURG TRIUMPH 8.0 PF LTX (GLOVE) ×6 IMPLANT
GOWN STRL REUS W/ TWL LRG LVL3 (GOWN DISPOSABLE) ×3 IMPLANT
GOWN STRL REUS W/TWL LRG LVL3 (GOWN DISPOSABLE) ×6
LENS IOL TECNIS EYHANCE 24.0 (Intraocular Lens) ×2 IMPLANT
MARKER SKIN DUAL TIP RULER LAB (MISCELLANEOUS) ×2 IMPLANT
NDL RETROBULBAR .5 NSTRL (NEEDLE) IMPLANT
NEEDLE CAPSULORHEX 25GA (NEEDLE) ×2 IMPLANT
NEEDLE FILTER BLUNT 18X 1/2SAF (NEEDLE) ×2
NEEDLE FILTER BLUNT 18X1 1/2 (NEEDLE) ×2 IMPLANT
PACK CATARACT BRASINGTON (MISCELLANEOUS) ×2 IMPLANT
PACK EYE AFTER SURG (MISCELLANEOUS) ×2 IMPLANT
PACK OPTHALMIC (MISCELLANEOUS) ×2 IMPLANT
RING MALYGIN 7.0 (MISCELLANEOUS) IMPLANT
SOLUTION OPHTHALMIC SALT (MISCELLANEOUS) ×2 IMPLANT
SUT ETHILON 10-0 CS-B-6CS-B-6 (SUTURE)
SUT VICRYL  9 0 (SUTURE)
SUT VICRYL 9 0 (SUTURE) IMPLANT
SUTURE EHLN 10-0 CS-B-6CS-B-6 (SUTURE) IMPLANT
SYR 3ML LL SCALE MARK (SYRINGE) ×4 IMPLANT
SYR TB 1ML LUER SLIP (SYRINGE) ×2 IMPLANT
WATER STERILE IRR 250ML POUR (IV SOLUTION) ×2 IMPLANT
WIPE NON LINTING 3.25X3.25 (MISCELLANEOUS) ×2 IMPLANT

## 2020-10-03 NOTE — Anesthesia Postprocedure Evaluation (Signed)
Anesthesia Post Note  Patient: Debra Saunders  Procedure(s) Performed: CATARACT EXTRACTION PHACO AND INTRAOCULAR LENS PLACEMENT (IOC) LEFT DIABETIC (Left Eye)     Patient location during evaluation: PACU Anesthesia Type: MAC Level of consciousness: awake and alert Pain management: pain level controlled Vital Signs Assessment: post-procedure vital signs reviewed and stable Respiratory status: spontaneous breathing Cardiovascular status: blood pressure returned to baseline Postop Assessment: no apparent nausea or vomiting, adequate PO intake and no headache Anesthetic complications: no   No complications documented.  Adele Barthel Neamiah Sciarra

## 2020-10-03 NOTE — Anesthesia Procedure Notes (Signed)
Procedure Name: MAC Performed by: Jamesrobert Ohanesian, CRNA Pre-anesthesia Checklist: Patient identified, Emergency Drugs available, Suction available, Timeout performed and Patient being monitored Patient Re-evaluated:Patient Re-evaluated prior to induction Oxygen Delivery Method: Nasal cannula Placement Confirmation: positive ETCO2       

## 2020-10-03 NOTE — Anesthesia Preprocedure Evaluation (Signed)
Anesthesia Evaluation    Airway Mallampati: I  TM Distance: >3 FB Neck ROM: Full    Dental   Pulmonary COPD, former smoker,  4L home oxygen regularly for pHTN  4L  Pulmonary exam normal        Cardiovascular Exercise Tolerance: Poor hypertension, Normal cardiovascular exam+ dysrhythmias (afib, on Eliquis) Atrial Fibrillation + pacemaker   Pulmonary HTN, on medications. RVSP 50per 2021 echo but normal pressures on recent [place]  08/2020 RHC 1. Low/normal filling pressures.  2. Very mild pulmonary hypertension.  3. Preserved cardiac output.     08/2019 echo  IMPRESSIONS    1. Left ventricular ejection fraction, by estimation, is 60 to 65%. The  left ventricle has normal function. The left ventricle has no regional  wall motion abnormalities. Left ventricular diastolic parameters are  consistent with Grade II diastolic  dysfunction (pseudonormalization).  2. Right ventricular systolic function is normal. The right ventricular  size is normal. There is moderately elevated pulmonary artery systolic  pressure. The estimated right ventricular systolic pressure is 86.7 mmHg.  3. Left atrial size was moderately dilated.  4. The mitral valve is normal in structure. Moderate to severe mitral  valve regurgitation. No evidence of mitral stenosis.  5. Tricuspid valve regurgitation is mild to moderate.  6. The aortic valve is normal in structure. Aortic valve regurgitation is  not visualized. Mild to moderate aortic valve sclerosis/calcification is  present, without any evidence of aortic stenosis.  7. Pulmonic valve regurgitation is moderate.  8. The inferior vena cava is normal in size with <50% respiratory  variability, suggesting right atrial pressure of 8 mmHg.    Neuro/Psych CVA    GI/Hepatic   Endo/Other  diabetesBMI 39  Renal/GU      Musculoskeletal   Abdominal   Peds  Hematology   Anesthesia Other  Findings   Reproductive/Obstetrics                            Anesthesia Physical Anesthesia Plan  ASA: III  Anesthesia Plan: MAC   Post-op Pain Management:    Induction:   PONV Risk Score and Plan: 2 and Midazolam, TIVA and Treatment may vary due to age or medical condition  Airway Management Planned: Nasal Cannula and Natural Airway  Additional Equipment: None  Intra-op Plan:   Post-operative Plan:   Informed Consent: I have reviewed the patients History and Physical, chart, labs and discussed the procedure including the risks, benefits and alternatives for the proposed anesthesia with the patient or authorized representative who has indicated his/her understanding and acceptance.       Plan Discussed with: CRNA  Anesthesia Plan Comments:         Anesthesia Quick Evaluation

## 2020-10-03 NOTE — Op Note (Signed)
  OPERATIVE NOTE  Debra Saunders [id number] 10/03/2020   PREOPERATIVE DIAGNOSIS:  Nuclear sclerotic cataract left eye. H25.12   POSTOPERATIVE DIAGNOSIS:    Nuclear sclerotic cataract left eye.     PROCEDURE:  Phacoemusification with posterior chamber intraocular lens placement of the left eye  Ultrasound time: Procedure(s) with comments: CATARACT EXTRACTION PHACO AND INTRAOCULAR LENS PLACEMENT (IOC) LEFT DIABETIC (Left) - 3.25 0:59.3 5.5%  LENS:   Implant Name Type Inv. Item Serial No. Manufacturer Lot No. LRB No. Used Action  LENS IOL TECNIS EYHANCE 24.0 - [id number] Intraocular Lens LENS IOL TECNIS EYHANCE 24.0 [id number] JOHNSON   Left 1 Implanted      SURGEON:  Wyonia Hough, MD   ANESTHESIA:  Topical with tetracaine drops and 2% Xylocaine jelly, augmented with 1% preservative-free intracameral lidocaine.    COMPLICATIONS:  None.   DESCRIPTION OF PROCEDURE:  The patient was identified in the holding room and transported to the operating room and placed in the supine position under the operating microscope.  The left eye was identified as the operative eye and it was prepped and draped in the usual sterile ophthalmic fashion.   A 1 millimeter clear-corneal paracentesis was made at the 1:30 position.  0.5 ml of preservative-free 1% lidocaine was injected into the anterior chamber.  The anterior chamber was filled with Viscoat viscoelastic.  A 2.4 millimeter keratome was used to make a near-clear corneal incision at the 10:30 position.  .  A curvilinear capsulorrhexis was made with a cystotome and capsulorrhexis forceps.  Balanced salt solution was used to hydrodissect and hydrodelineate the nucleus.   Phacoemulsification was then used in stop and chop fashion to remove the lens nucleus and epinucleus.  The remaining cortex was then removed using the irrigation and aspiration handpiece. Provisc was then placed into the capsular bag to distend it for lens placement.  A  lens was then injected into the capsular bag.  The remaining viscoelastic was aspirated.   Wounds were hydrated with balanced salt solution.  The anterior chamber was inflated to a physiologic pressure with balanced salt solution.  No wound leaks were noted. Vigamox 0.2 ml of a 1mg  per ml solution was injected into the anterior chamber for a dose of 0.2 mg of intracameral antibiotic at the completion of the case.   Timolol and Brimonidine drops were applied to the eye.  The patient was taken to the recovery room in stable condition without complications of anesthesia or surgery.  [name] 10/03/2020, 11:44 AM

## 2020-10-03 NOTE — Transfer of Care (Signed)
Immediate Anesthesia Transfer of Care Note  Patient: Debra Saunders  Procedure(s) Performed: CATARACT EXTRACTION PHACO AND INTRAOCULAR LENS PLACEMENT (IOC) LEFT DIABETIC (Left Eye)  Patient Location: PACU  Anesthesia Type: MAC  Level of Consciousness: awake, alert  and patient cooperative  Airway and Oxygen Therapy: Patient Spontanous Breathing and Patient connected to supplemental oxygen  Post-op Assessment: Post-op Vital signs reviewed, Patient's Cardiovascular Status Stable, Respiratory Function Stable, Patent Airway and No signs of Nausea or vomiting  Post-op Vital Signs: Reviewed and stable  Complications: No complications documented.

## 2020-10-03 NOTE — H&P (Signed)
[facility]   Primary Care Physician:  [name], MD Ophthalmologist: Dr. Leandrew Koyanagi  Pre-Procedure History & Physical: HPI:  Debra Saunders is a 75 y.o. female here for ophthalmic surgery.   Past Medical History:  Diagnosis Date  . Arthritis   . Atrial fibrillation, persistent ([place])    a. s/p TEE-guided DCCV on 08/20/2017  . Cardiorenal syndrome    a. 09/2017 Creat rose to 2.7 w/ diuresis-->HD x 2.  . Chest pain    a. 01/2012 Cath: nonobs dzs;  c. 04/2014 MV: No ischemia.  . Chronic back pain   . Chronic combined systolic (congestive) and diastolic (congestive) heart failure ([place])    a. Dx 2005 @ [facility];  b. 02/2014 Echo: EF 55-60%; c. 07/2017: EF 30-35%, diffuse HK, trivial AI, severe MR, moderate TR; d. 09/2017 Echo: EF 40-45%; e. 02/2018 Echo: EF 55-60%, Gr1 DD; f. 08/2019 Echo: EF 60-65%, Gr2 DD, Nl RV fxn, RVSP 49.62mmHg. Mod-Sev MR. Mild to mod TR. Mod PR. RA ~ 64mmHg.  [name] Chronic respiratory failure (HCC)    a. on home O2  . COPD (chronic obstructive pulmonary disease) ([place])    a. Home O2 - 3lpm  . Gastritis   . History of intracranial hemorrhage    a. 6/14 described by neurosurgery as "small frontal posttraumatic SAH " - pt was on xarelto @ the time.  . Hyperlipidemia   . Hypertension   . Interstitial lung disease ([place])   . Lipoma of back   . Lymphedema    a. Chronic LLE edema.  [name] NICM (nonischemic cardiomyopathy) ([place])    a. 09/2017 Echo: EF 40-45%; b. 09/2017 Cath: minor, insignificant CAD; c. 02/2018 Echo: EF 55-60%, Gr1 DD; d. 08/2019 Echo: EF 60-65%, Gr2 DD.  [name] Obesity   . PAH (pulmonary artery hypertension) ([place])    a. 09/2017 Echo: PASP 85mmHg; b. 09/2017 RHC: RA 23, RV 84/13, PA 84/29, PCWP 20, CO 3.89, CI 1.89. LVEDP 18.  . Presence of permanent cardiac pacemaker 01/09/2014  . Sepsis with metabolic encephalopathy ([place])   . Sleep apnea   . Streptococcal bacteremia   . Stroke ([place]) 2014  . Subarachnoid hemorrhage ([place]) 01/19/2013  . Symptomatic  bradycardia    a. s/p BSX PPM.  . Thyroid nodule   . Type II diabetes mellitus ([place])   . Urinary incontinence     Past Surgical History:  Procedure Laterality Date  . ABDOMINAL HYSTERECTOMY  1969  . BREAST BIOPSY Right 10/28/2019   lymph node bx, [place] Bx, pending path   . CARDIAC CATHETERIZATION  2013   @ [facility]: No obstructive CAD: Only 20% ostial left circumflex.  [name] CARDIOVERSION N/A 10/12/2017   Procedure: CARDIOVERSION;  Surgeon: Minna Merritts, MD;  Location: [facility] ORS;  Service: Cardiovascular;  Laterality: N/A;  . CATARACT EXTRACTION W/PHACO Right 09/19/2020   Procedure: CATARACT EXTRACTION PHACO AND INTRAOCULAR LENS PLACEMENT (IOC) RIGHT DIABETIC 3.95 00:43.0 9.2%;  Surgeon: Leandrew Koyanagi, MD;  Location: [facility];  Service: Ophthalmology;  Laterality: Right;  Pt requests to be last sleep apnea  . CHOLECYSTECTOMY    . COLONOSCOPY WITH PROPOFOL N/A 09/10/2018   Procedure: COLONOSCOPY WITH PROPOFOL;  Surgeon: Manya Silvas, MD;  Location: [facility] ENDOSCOPY;  Service: Endoscopy;  Laterality: N/A;  . CYST REMOVAL TRUNK  2002   BACK  . DIALYSIS/PERMA CATHETER INSERTION N/A 10/06/2017   Procedure: DIALYSIS/PERMA CATHETER INSERTION;  Surgeon: Katha Cabal, MD;  Location: [facility] LAB;  Service: Cardiovascular;  Laterality: N/A;  . INSERT / REPLACE / REMOVE PACEMAKER    . lipoma removal  2014   back  . PACEMAKER INSERTION  [address]. Scientific dual chamber pacemaker implanted by Dr Caryl Comes for symptomatic bradycardia  . PERMANENT PACEMAKER INSERTION N/A 01/09/2014   Procedure: PERMANENT PACEMAKER INSERTION;  Surgeon: Deboraha Sprang, MD;  Location: [facility] CATH LAB;  Service: Cardiovascular;  Laterality: N/A;  . RIGHT HEART CATH N/A 04/19/2018   Procedure: RIGHT HEART CATH;  Surgeon: Larey Dresser, MD;  Location: [place] [facility] LAB;  Service: Cardiovascular;  Laterality: N/A;  . RIGHT HEART CATH N/A 08/30/2020   Procedure: RIGHT HEART CATH;  Surgeon: Larey Dresser, MD;  Location: [place] [facility];  Service: Cardiovascular;  Laterality: N/A;  . RIGHT/LEFT HEART CATH AND CORONARY ANGIOGRAPHY N/A 10/19/2017   Procedure: RIGHT/LEFT HEART CATH AND CORONARY ANGIOGRAPHY;  Surgeon: Wellington Hampshire, MD;  Location: [place] [facility];  Service: Cardiovascular;  Laterality: N/A;  . TEE WITHOUT CARDIOVERSION N/A 08/20/2017   Procedure: TRANSESOPHAGEAL ECHOCARDIOGRAM (TEE) & Direct current cardioversion;  Surgeon: Minna Merritts, MD;  Location: [facility] ORS;  Service: Cardiovascular;  Laterality: N/A;  . TRIGGER FINGER RELEASE      Prior to Admission medications   Medication Sig Start Date End Date Taking? Authorizing Provider  acetaminophen (TYLENOL) 325 MG tablet Take 325-650 mg by mouth every 6 (six) hours as needed for mild pain. Reported on 10/10/2015   Yes [provider]  apixaban (ELIQUIS) 5 MG TABS tablet Take 1 tablet (5 mg total) by mouth 2 (two) times daily. Needs appt for future refills 07/26/20  Yes Wellington Hampshire, MD  cholestyramine Lucrezia Starch) 4 GM/DOSE powder Take 4 g by mouth daily. 08/03/20  Yes [provider]  digoxin (LANOXIN) 0.125 MG tablet Take 1 tablet (125 mcg total) by mouth daily. 09/03/20  Yes Larey Dresser, MD  gabapentin (NEURONTIN) 100 MG capsule TAKE 2 CAPSULES BY MOUTH THREE TIMES DAILY Patient taking differently: Take 200 mg by mouth 3 (three) times daily. 08/13/20  Yes [name], MD  glucose blood test strip Use with True Metrix glucometer to check sugars BID 11/14/19  Yes [name], MD  KLOR-CON M20 20 MEQ tablet Take 1 tablet by mouth once daily 09/24/20  Yes Larey Dresser, MD  Lancets MISC Use with True Metrix glucometer to check sugars BID 11/14/19  Yes [name], MD  macitentan (OPSUMIT) 10 MG tablet Take 1 tablet (10 mg total) by mouth daily. 06/13/20  Yes Larey Dresser, MD  Menthol, Topical Analgesic, (BIOFREEZE EX) Apply 1 application topically 4 (four) times daily as  needed (for pain).   Yes [provider]  OXYGEN Inhale 4 L into the lungs continuous.   Yes [provider]  rosuvastatin (CRESTOR) 10 MG tablet Take 1 tablet (10 mg total) by mouth daily. 05/29/20  Yes Larey Dresser, MD  Selexipag (UPTRAVI) 1600 MCG TABS Take 1,600 mcg by mouth 2 (two) times daily.   Yes [provider]  sildenafil (REVATIO) 20 MG tablet Take 1 tablet (20 mg total) by mouth 3 (three) times daily. 07/20/20  Yes Larey Dresser, MD  spironolactone (ALDACTONE) 25 MG tablet Take 1 tablet (25 mg total) by mouth at bedtime. Please call for a office visit [phone] 04/19/20  Yes Larey Dresser, MD  torsemide (DEMADEX) 20 MG tablet Take 3 tablets (60 mg total) by mouth 2 (two) times daily.  Patient taking differently: Take 60 mg by mouth 2 (two) times daily. Per Dr [name] reduce to 40 mg twice daily 08/24/20  Yes Larey Dresser, MD  albuterol (PROVENTIL) (2.5 MG/3ML) 0.083% nebulizer solution Take 3 mLs (2.5 mg total) by nebulization every 6 (six) hours. Patient not taking: No sig reported 08/28/17   Loletha Grayer, MD  meclizine (ANTIVERT) 25 MG tablet Take 1 tablet (25 mg total) by mouth 3 (three) times daily as needed for dizziness. Patient not taking: No sig reported 11/04/19   [name], MD  ondansetron (ZOFRAN) 8 MG tablet Take 1 tablet (8 mg total) by mouth every 8 (eight) hours as needed for nausea or vomiting. Patient not taking: No sig reported 09/25/14   Jackolyn Confer, MD    Allergies as of 07/23/2020 - Review Complete 07/12/2020  Allergen Reaction Noted  . Aspirin Hives and Other (See Comments) 03/06/2014  . Other Other (See Comments) 04/12/2013  . Penicillins Hives, Shortness Of Breath, Swelling, and Other (See Comments) 01/15/2012    Family History  Problem Relation Age of Onset  . Heart disease Mother   . Hypertension Mother   . COPD Mother        was a smoker  . Thyroid disease Mother   . Hypertension Father   .  Hypertension Sister   . Thyroid disease Sister   . Breast cancer Sister   . Hypertension Sister   . Thyroid disease Sister   . Bone cancer Sister   . Breast cancer Maternal Aunt   . Kidney disease Neg Hx   . Bladder Cancer Neg Hx     Social History   Socioeconomic History  . Marital status: Divorced    Spouse name: Not on file  . Number of children: 4  . Years of education: 23  . Highest education level: High school graduate  Occupational History  . Occupation: Retired- factory work    Comment: exposed to Pharmacist, community  Tobacco Use  . Smoking status: Former Smoker    Packs/day: 0.30    Years: 2.00    Pack years: 0.60    Types: Cigarettes    Quit date: 06/23/1990    Years since quitting: 30.3  . Smokeless tobacco: Never Used  Vaping Use  . Vaping Use: Never used  Substance and Sexual Activity  . Alcohol use: No  . Drug use: No  . Sexual activity: Not Currently  Other Topics Concern  . Not on file  Social History Narrative   Lives in [place] with daughter. Has 4 children. No pets.      Work - Restaurant manager, fast food, retired      Diet - regular      02/14/2019: reports has Meals On Wheels delivered to her home;       Remote hx of smoking > 30 years; No alcohol-    Social Determinants of Health   Financial Resource Strain: Low Risk   . Difficulty of Paying Living Expenses: Not hard at all  Food Insecurity: No Food Insecurity  . Worried About Charity fundraiser in the Last Year: Never true  . Ran Out of Food in the Last Year: Never true  Transportation Needs: Unmet Transportation Needs  . Lack of Transportation (Medical): Yes  . Lack of Transportation (Non-Medical): Yes  Physical Activity: Not on file  Stress: Not on file  Social Connections: Not on file  Intimate Partner Violence: Not on file    Review of Systems: See HPI,  otherwise negative ROS  Physical Exam: BP 113/85   Pulse 62   Temp 97.6 F (36.4 C) (Temporal)   Ht 5' (1.524 m)   Wt 87.1 kg   SpO2  100%   BMI 37.50 kg/m  General:   Alert,  pleasant and cooperative in NAD Head:  Normocephalic and atraumatic. Lungs:  Clear to auscultation.    Heart:  Regular rate and rhythm.   Impression/Plan: Debra Saunders is here for ophthalmic surgery.  Risks, benefits, limitations, and alternatives regarding ophthalmic surgery have been reviewed with the patient.  Questions have been answered.  All parties agreeable.   Leandrew Koyanagi, MD  10/03/2020, 10:52 AM

## 2020-10-04 ENCOUNTER — Encounter: Payer: Self-pay | Admitting: Ophthalmology

## 2020-10-10 DIAGNOSIS — I509 Heart failure, unspecified: Secondary | ICD-10-CM | POA: Diagnosis not present

## 2020-10-15 ENCOUNTER — Other Ambulatory Visit: Payer: Self-pay

## 2020-10-15 ENCOUNTER — Ambulatory Visit
Admission: RE | Admit: 2020-10-15 | Discharge: 2020-10-15 | Disposition: A | Discharge status: Home / Self Care | Payer: Medicare HMO | Source: Ambulatory Visit | Attending: Gastroenterology | Admitting: Gastroenterology

## 2020-10-15 DIAGNOSIS — R1011 Right upper quadrant pain: Secondary | ICD-10-CM | POA: Diagnosis not present

## 2020-10-15 DIAGNOSIS — I708 Atherosclerosis of other arteries: Secondary | ICD-10-CM | POA: Diagnosis not present

## 2020-10-15 DIAGNOSIS — R197 Diarrhea, unspecified: Secondary | ICD-10-CM | POA: Diagnosis not present

## 2020-10-15 DIAGNOSIS — N838 Other noninflammatory disorders of ovary, fallopian tube and broad ligament: Secondary | ICD-10-CM | POA: Diagnosis not present

## 2020-10-15 DIAGNOSIS — I2510 Atherosclerotic heart disease of native coronary artery without angina pectoris: Secondary | ICD-10-CM | POA: Diagnosis not present

## 2020-10-15 DIAGNOSIS — R0782 Intercostal pain: Secondary | ICD-10-CM | POA: Diagnosis not present

## 2020-10-15 DIAGNOSIS — J8410 Pulmonary fibrosis, unspecified: Secondary | ICD-10-CM | POA: Diagnosis not present

## 2020-10-15 LAB — POCT I-STAT CREATININE: Creatinine, Ser: 1.2 mg/dL — ABNORMAL HIGH (ref 0.44–1.00)

## 2020-10-15 MED ORDER — IOHEXOL 300 MG/ML  SOLN
100.0000 mL | Freq: Once | INTRAMUSCULAR | Status: AC | PRN
  Administered 2020-10-15: 100 mL via INTRAVENOUS

## 2020-10-17 ENCOUNTER — Telehealth: Payer: Self-pay | Admitting: Internal Medicine

## 2020-10-17 NOTE — Telephone Encounter (Signed)
On 4/27-I spoke to patient's daughter regarding results of the CT scan-negative for any significant concerns for malignancy/acute process causing right upper quadrant abdominal pain/chest pain.  As per the daughter patient's pain is improved-question musculoskeletal.  Recommend follow-up as planned.  GB

## 2020-10-19 ENCOUNTER — Other Ambulatory Visit: Payer: Self-pay | Admitting: Family Medicine

## 2020-10-22 ENCOUNTER — Other Ambulatory Visit (HOSPITAL_COMMUNITY): Payer: Self-pay

## 2020-10-23 ENCOUNTER — Encounter (HOSPITAL_COMMUNITY): Payer: Self-pay

## 2020-10-23 NOTE — Progress Notes (Signed)
Today had a home visit.  She states she has been doing ok, she is always short of breath.  Her weight went down a lb.  She denies any chest pain, headaches or dizziness.  She states her eyes hurt from her surgeries.  She states she has infection in her eyes right now from surgery.  She states she does some small things around the house.  She has been out to a lot of appts lately and aware of up coming appts.  She wears her oxygen 24 hours a day.  She states the pain is gone from her side right now.  She has all her medications.  Filled 1 week of medications up.  Called in refill for uptravi, will be delivered tomorrow.  Attempted to call in sildinifil and opsumit but they will not fill til 1 or 2 days before and overnight it.  Will visit again next week to call and refill then.  She has a lot of family support.  Her nephew is at her home during the day, he has set up an office to work now since covid.  She is by her self on weekends but has family that visits.  She has everything for daily living.  Will continue to visit for heart failure.   [place] [phone]

## 2020-10-24 ENCOUNTER — Inpatient Hospital Stay: Payer: Medicare HMO

## 2020-10-24 ENCOUNTER — Other Ambulatory Visit: Payer: Self-pay

## 2020-10-24 ENCOUNTER — Inpatient Hospital Stay: Payer: Medicare HMO | Attending: Internal Medicine

## 2020-10-24 DIAGNOSIS — Z8673 Personal history of transient ischemic attack (TIA), and cerebral infarction without residual deficits: Secondary | ICD-10-CM | POA: Insufficient documentation

## 2020-10-24 DIAGNOSIS — Z86718 Personal history of other venous thrombosis and embolism: Secondary | ICD-10-CM | POA: Insufficient documentation

## 2020-10-24 DIAGNOSIS — Z87891 Personal history of nicotine dependence: Secondary | ICD-10-CM | POA: Insufficient documentation

## 2020-10-24 DIAGNOSIS — R1011 Right upper quadrant pain: Secondary | ICD-10-CM

## 2020-10-24 DIAGNOSIS — Z79899 Other long term (current) drug therapy: Secondary | ICD-10-CM | POA: Diagnosis not present

## 2020-10-24 DIAGNOSIS — I132 Hypertensive heart and chronic kidney disease with heart failure and with stage 5 chronic kidney disease, or end stage renal disease: Secondary | ICD-10-CM | POA: Diagnosis not present

## 2020-10-24 DIAGNOSIS — Z7901 Long term (current) use of anticoagulants: Secondary | ICD-10-CM | POA: Diagnosis not present

## 2020-10-24 DIAGNOSIS — I130 Hypertensive heart and chronic kidney disease with heart failure and stage 1 through stage 4 chronic kidney disease, or unspecified chronic kidney disease: Secondary | ICD-10-CM | POA: Insufficient documentation

## 2020-10-24 DIAGNOSIS — Z803 Family history of malignant neoplasm of breast: Secondary | ICD-10-CM | POA: Diagnosis not present

## 2020-10-24 DIAGNOSIS — D509 Iron deficiency anemia, unspecified: Secondary | ICD-10-CM | POA: Insufficient documentation

## 2020-10-24 DIAGNOSIS — E538 Deficiency of other specified B group vitamins: Secondary | ICD-10-CM | POA: Diagnosis not present

## 2020-10-24 DIAGNOSIS — N186 End stage renal disease: Secondary | ICD-10-CM | POA: Diagnosis not present

## 2020-10-24 DIAGNOSIS — I4819 Other persistent atrial fibrillation: Secondary | ICD-10-CM | POA: Insufficient documentation

## 2020-10-24 LAB — CBC WITH DIFFERENTIAL/PLATELET
Abs Immature Granulocytes: 0.01 10*3/uL (ref 0.00–0.07)
Basophils Absolute: 0 10*3/uL (ref 0.0–0.1)
Basophils Relative: 1 %
Eosinophils Absolute: 0.2 10*3/uL (ref 0.0–0.5)
Eosinophils Relative: 3 %
HCT: 32.4 % — ABNORMAL LOW (ref 36.0–46.0)
Hemoglobin: 9.6 g/dL — ABNORMAL LOW (ref 12.0–15.0)
Immature Granulocytes: 0 %
Lymphocytes Relative: 27 %
Lymphs Abs: 1.4 10*3/uL (ref 0.7–4.0)
MCH: 27.1 pg (ref 26.0–34.0)
MCHC: 29.6 g/dL — ABNORMAL LOW (ref 30.0–36.0)
MCV: 91.5 fL (ref 80.0–100.0)
Monocytes Absolute: 0.3 10*3/uL (ref 0.1–1.0)
Monocytes Relative: 7 %
Neutro Abs: 3.2 10*3/uL (ref 1.7–7.7)
Neutrophils Relative %: 62 %
Platelets: 191 10*3/uL (ref 150–400)
RBC: 3.54 MIL/uL — ABNORMAL LOW (ref 3.87–5.11)
RDW: 12.4 % (ref 11.5–15.5)
WBC: 5.2 10*3/uL (ref 4.0–10.5)
nRBC: 0 % (ref 0.0–0.2)

## 2020-10-24 LAB — HEPATIC FUNCTION PANEL
ALT: 17 U/L (ref 0–44)
AST: 19 U/L (ref 15–41)
Albumin: 3.9 g/dL (ref 3.5–5.0)
Alkaline Phosphatase: 115 U/L (ref 38–126)
Bilirubin, Direct: <0.1 mg/dL (ref 0.0–0.2)
Total Bilirubin: 0.5 mg/dL (ref 0.3–1.2)
Total Protein: 7.7 g/dL (ref 6.5–8.1)

## 2020-10-24 LAB — BASIC METABOLIC PANEL
Anion gap: 8 (ref 5–15)
BUN: 27 mg/dL — ABNORMAL HIGH (ref 8–23)
CO2: 30 mmol/L (ref 22–32)
Calcium: 8.9 mg/dL (ref 8.9–10.3)
Chloride: 102 mmol/L (ref 98–111)
Creatinine, Ser: 0.95 mg/dL (ref 0.44–1.00)
GFR, Estimated: >60 mL/min (ref >60)
Glucose, Bld: 104 mg/dL — ABNORMAL HIGH (ref 70–99)
Potassium: 4.4 mmol/L (ref 3.5–5.1)
Sodium: 140 mmol/L (ref 135–145)

## 2020-10-24 LAB — IRON AND TIBC
Iron: 60 ug/dL (ref 28–170)
Saturation Ratios: 16 % (ref 10.4–31.8)
TIBC: 388 ug/dL (ref 250–450)
UIBC: 328 ug/dL

## 2020-10-24 LAB — FERRITIN: Ferritin: 215 ng/mL (ref 11–307)

## 2020-10-29 ENCOUNTER — Other Ambulatory Visit: Payer: Self-pay | Admitting: *Deleted

## 2020-10-29 ENCOUNTER — Inpatient Hospital Stay: Payer: Medicare HMO

## 2020-10-29 ENCOUNTER — Other Ambulatory Visit: Payer: Self-pay

## 2020-10-29 ENCOUNTER — Inpatient Hospital Stay (HOSPITAL_BASED_OUTPATIENT_CLINIC_OR_DEPARTMENT_OTHER): Payer: Medicare HMO | Admitting: Internal Medicine

## 2020-10-29 VITALS — BP 120/51 | HR 59 | Resp 20

## 2020-10-29 DIAGNOSIS — E538 Deficiency of other specified B group vitamins: Secondary | ICD-10-CM

## 2020-10-29 DIAGNOSIS — I4819 Other persistent atrial fibrillation: Secondary | ICD-10-CM | POA: Diagnosis not present

## 2020-10-29 DIAGNOSIS — E611 Iron deficiency: Secondary | ICD-10-CM

## 2020-10-29 DIAGNOSIS — D509 Iron deficiency anemia, unspecified: Secondary | ICD-10-CM | POA: Diagnosis not present

## 2020-10-29 DIAGNOSIS — Z8673 Personal history of transient ischemic attack (TIA), and cerebral infarction without residual deficits: Secondary | ICD-10-CM | POA: Diagnosis not present

## 2020-10-29 DIAGNOSIS — Z79899 Other long term (current) drug therapy: Secondary | ICD-10-CM | POA: Diagnosis not present

## 2020-10-29 DIAGNOSIS — Z7901 Long term (current) use of anticoagulants: Secondary | ICD-10-CM | POA: Diagnosis not present

## 2020-10-29 DIAGNOSIS — I132 Hypertensive heart and chronic kidney disease with heart failure and with stage 5 chronic kidney disease, or end stage renal disease: Secondary | ICD-10-CM | POA: Diagnosis not present

## 2020-10-29 DIAGNOSIS — I130 Hypertensive heart and chronic kidney disease with heart failure and stage 1 through stage 4 chronic kidney disease, or unspecified chronic kidney disease: Secondary | ICD-10-CM | POA: Diagnosis not present

## 2020-10-29 DIAGNOSIS — N186 End stage renal disease: Secondary | ICD-10-CM | POA: Diagnosis not present

## 2020-10-29 MED ORDER — IRON SUCROSE 20 MG/ML IV SOLN
200.0000 mg | Freq: Once | INTRAVENOUS | Status: AC
  Administered 2020-10-29: 200 mg via INTRAVENOUS
  Filled 2020-10-29: qty 10

## 2020-10-29 MED ORDER — CYANOCOBALAMIN 1000 MCG/ML IJ SOLN
1000.0000 ug | Freq: Once | INTRAMUSCULAR | Status: AC
  Administered 2020-10-29: 1000 ug via INTRAMUSCULAR
  Filled 2020-10-29: qty 1

## 2020-10-29 MED ORDER — SODIUM CHLORIDE 0.9 % IV SOLN
Freq: Once | INTRAVENOUS | Status: AC
  Filled 2020-10-29: qty 250

## 2020-10-29 NOTE — Patient Instructions (Signed)
[facility]    Discharge Instructions:  Thank you for choosing [facility] to provide your oncology and hematology care.  If you have a lab appointment with the [facility], please go directly to the [facility] and check in at the registration area.  We strive to give you quality time with your provider. You may need to reschedule your appointment if you arrive late (15 or more minutes).  Arriving late affects you and other patients whose appointments are after yours.  Also, if you miss three or more appointments without notifying the office, you may be dismissed from the clinic at the provider's discretion.      For prescription refill requests, have your pharmacy contact our office and allow 72 hours for refills to be completed.    Today you received the following treatment: Venofer infusion and Cyanocobalamin injection.  BELOW ARE SYMPTOMS THAT SHOULD BE REPORTED IMMEDIATELY: . *FEVER GREATER THAN 100.4 F (38 C) OR HIGHER . *CHILLS OR SWEATING . *NAUSEA AND VOMITING THAT IS NOT CONTROLLED WITH YOUR NAUSEA MEDICATION . *UNUSUAL SHORTNESS OF BREATH . *UNUSUAL BRUISING OR BLEEDING . *URINARY PROBLEMS (pain or burning when urinating, or frequent urination) . *BOWEL PROBLEMS (unusual diarrhea, constipation, pain near the anus) . TENDERNESS IN MOUTH AND THROAT WITH OR WITHOUT PRESENCE OF ULCERS (sore throat, sores in mouth, or a toothache) . UNUSUAL RASH, SWELLING OR PAIN  . UNUSUAL VAGINAL DISCHARGE OR ITCHING   Items with * indicate a potential emergency and should be followed up as soon as possible or go to the Emergency Department if any problems should occur.  Should you have questions after your visit or need to cancel or reschedule your appointment, please contact [facility]  [phone] and follow the prompts.  Office hours are 8:00 a.m. to 4:30 p.m. Monday - Friday. Please note that  voicemails left after 4:00 p.m. may not be returned until the following business day.  We are closed weekends and major holidays. You have access to a nurse at all times for urgent questions. Please call the main number to the clinic [phone] and follow the prompts.  For any non-urgent questions, you may also contact your provider using MyChart. We now offer e-Visits for anyone 50 and older to request care online for non-urgent symptoms. For details visit [email or web address].   Also download the MyChart app! Go to the app store, search "MyChart", open the app, select [facility], and log in with your MyChart username and password.  Due to Covid, a mask is required upon entering the hospital/clinic. If you do not have a mask, one will be given to you upon arrival. For doctor visits, patients may have 1 support person aged 50 or older with them. For treatment visits, patients cannot have anyone with them due to current Covid guidelines and our immunocompromised population.   Cyanocobalamin, Vitamin B12 injection  What is this medicine? CYANOCOBALAMIN (sye an oh koe BAL a min) is a man made form of vitamin B12. Vitamin B12 is used in the growth of healthy blood cells, nerve cells, and proteins in the body. It also helps with the metabolism of fats and carbohydrates. This medicine is used to treat people who can not absorb vitamin B12. This medicine may be used for other purposes; ask your health care provider or pharmacist if you have questions. COMMON BRAND NAME(S): B-12 Compliance Kit, B-12 Injection Kit, Cyomin, LA-12, Nutri-Twelve, Physicians EZ Use B-12, Primabalt  What should I tell my health care provider before I take this medicine? They need to know if you have any of these conditions:  kidney disease  Leber's disease  megaloblastic anemia  an unusual or allergic reaction to cyanocobalamin, cobalt, other medicines, foods, dyes, or preservatives  pregnant or trying to get  pregnant  breast-feeding How should I use this medicine? This medicine is injected into a muscle or deeply under the skin. It is usually given by a health care professional in a clinic or doctor's office. However, your doctor may teach you how to inject yourself. Follow all instructions. Talk to your pediatrician regarding the use of this medicine in children. Special care may be needed. Overdosage: If you think you have taken too much of this medicine contact a poison control center or emergency room at once. NOTE: This medicine is only for you. Do not share this medicine with others. What if I miss a dose? If you are given your dose at a clinic or doctor's office, call to reschedule your appointment. If you give your own injections and you miss a dose, take it as soon as you can. If it is almost time for your next dose, take only that dose. Do not take double or extra doses. What may interact with this medicine?  colchicine  heavy alcohol intake This list may not describe all possible interactions. Give your health care provider a list of all the medicines, herbs, non-prescription drugs, or dietary supplements you use. Also tell them if you smoke, drink alcohol, or use illegal drugs. Some items may interact with your medicine. What should I watch for while using this medicine? Visit your doctor or health care professional regularly. You may need blood work done while you are taking this medicine. You may need to follow a special diet. Talk to your doctor. Limit your alcohol intake and avoid smoking to get the best benefit. What side effects may I notice from receiving this medicine? Side effects that you should report to your doctor or health care professional as soon as possible:  allergic reactions like skin rash, itching or hives, swelling of the face, lips, or tongue  blue tint to skin  chest tightness, pain  difficulty breathing, wheezing  dizziness  red, swollen painful area  on the leg Side effects that usually do not require medical attention (report to your doctor or health care professional if they continue or are bothersome):  diarrhea  headache This list may not describe all possible side effects. Call your doctor for medical advice about side effects. You may report side effects to FDA at 1-800-FDA-1088. Where should I keep my medicine? Keep out of the reach of children. Store at room temperature between 15 and 30 degrees C (59 and 85 degrees F). Protect from light. Throw away any unused medicine after the expiration date. NOTE: This sheet is a summary. It may not cover all possible information. If you have questions about this medicine, talk to your doctor, pharmacist, or health care provider.  2021 Elsevier/Gold Standard (2007-09-20 22:10:20)  Iron Sucrose injection  What is this medicine? IRON SUCROSE (AHY ern SOO krohs) is an iron complex. Iron is used to make healthy red blood cells, which carry oxygen and nutrients throughout the body. This medicine is used to treat iron deficiency anemia in people with chronic kidney disease. This medicine may be used for other purposes; ask your health care provider or pharmacist if you have questions. COMMON BRAND NAME(S): Venofer What  should I tell my health care provider before I take this medicine? They need to know if you have any of these conditions:  anemia not caused by low iron levels  heart disease  high levels of iron in the blood  kidney disease  liver disease  an unusual or allergic reaction to iron, other medicines, foods, dyes, or preservatives  pregnant or trying to get pregnant  breast-feeding How should I use this medicine? This medicine is for infusion into a vein. It is given by a health care professional in a hospital or clinic setting. Talk to your pediatrician regarding the use of this medicine in children. While this drug may be prescribed for children as young as 2 years for  selected conditions, precautions do apply. Overdosage: If you think you have taken too much of this medicine contact a poison control center or emergency room at once. NOTE: This medicine is only for you. Do not share this medicine with others. What if I miss a dose? It is important not to miss your dose. Call your doctor or health care professional if you are unable to keep an appointment. What may interact with this medicine? Do not take this medicine with any of the following medications:  deferoxamine  dimercaprol  other iron products This medicine may also interact with the following medications:  chloramphenicol  deferasirox This list may not describe all possible interactions. Give your health care provider a list of all the medicines, herbs, non-prescription drugs, or dietary supplements you use. Also tell them if you smoke, drink alcohol, or use illegal drugs. Some items may interact with your medicine. What should I watch for while using this medicine? Visit your doctor or healthcare professional regularly. Tell your doctor or healthcare professional if your symptoms do not start to get better or if they get worse. You may need blood work done while you are taking this medicine. You may need to follow a special diet. Talk to your doctor. Foods that contain iron include: whole grains/cereals, dried fruits, beans, or peas, leafy green vegetables, and organ meats (liver, kidney). What side effects may I notice from receiving this medicine? Side effects that you should report to your doctor or health care professional as soon as possible:  allergic reactions like skin rash, itching or hives, swelling of the face, lips, or tongue  breathing problems  changes in blood pressure  cough  fast, irregular heartbeat  feeling faint or lightheaded, falls  fever or chills  flushing, sweating, or hot feelings  joint or muscle aches/pains  seizures  swelling of the ankles or  feet  unusually weak or tired Side effects that usually do not require medical attention (report to your doctor or health care professional if they continue or are bothersome):  diarrhea  feeling achy  headache  irritation at site where injected  nausea, vomiting  stomach upset  tiredness This list may not describe all possible side effects. Call your doctor for medical advice about side effects. You may report side effects to FDA at 1-800-FDA-1088. Where should I keep my medicine? This drug is given in a hospital or clinic and will not be stored at home. NOTE: This sheet is a summary. It may not cover all possible information. If you have questions about this medicine, talk to your doctor, pharmacist, or health care provider.  2021 Elsevier/Gold Standard (2011-03-20 17:14:35)

## 2020-10-29 NOTE — Assessment & Plan Note (Addendum)
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 CKD stage III [today GFR >60] currently s/p Venofe. Hemoglobin today is 9.6 improved from around 8. MAY 9BD,5329- Iron sat-16%.  Proceed with Venofer today.   # RUQ pain-CT scan chest/abdomen pelvis-negative for any acute process.  Currently pain is resolved.  Monitor for now.  # Etiology of anemia- ? Chronic diarrhea/malabsopbtion/CKD-iron saturation 18%.  #? CKD stage III-GFR 60- STABLE.[name]    #History of DVT/s/p IVC filter; ?  A. Fib-Eliquis-STABLE>   #Borderline low B12-230; s/p B12 injection. B12 injection today.   # DISPOSITION: will call with results - [phone]/home number # venofer/b12 today # venofer in 2 weeks # Follow up in 3 months-MD-labs- cbc;bmp; possible Venofer / B12 injection- Dr.B   Cc; Tammi Klippel

## 2020-10-29 NOTE — Progress Notes (Signed)
[place] NOTE  Patient Care Team: [name], MD as PCP - General (Family Medicine) Wellington Hampshire, MD as PCP - Cardiology (Cardiology) Deboraha Sprang, MD as PCP - Electrophysiology (Cardiology) Thom Chimes, MD as Referring Physician (Allergy) Juanito Doom, MD as Referring Physician (Internal Medicine) De Hollingshead, RPH-CPP as Pharmacist (Pharmacist) Cammie Sickle, MD as Consulting Physician (Hematology and Oncology) Jon Billings, RN as [place] Management Anemia CHIEF COMPLAINTS/PURPOSE OF CONSULTATION: Anemia   HEMATOLOGY HISTORY  # CHORNIC IRON DEFICIENCY ANEMIA [BL 10-11]-Jan today 21 PCP Hb-8.8; Antonieta Pert sat-5%; ferritin 11] EGD-; colonoscopy MARCH 2020 -polyp [[facility]-hold further work-up secondary to cardiac issues]; remote-EGD-?;  February 2021-LDH/haptoglobin normal; kappa lambda light chain ratio/M panel normal;  on Venofer; November 2020-CT scan question early cirrhosis/no splenomegaly.  No acute process; MARCH 2021- STOOL OCCULT x3- NEGATIVE.   #B12 deficiency-IM every 3 to 4 months  #2014 -question blood transfusion  [patient admitted for stroke]; CHF chronic -[ 2004 on 4 lits; Dr.Arida/Klein]; CT scan chest 2021 -no adenopathy; fibrotic changes; walker; CKD stage III-   #Incidental right axillary mass-s/p US biopsy [PCP] -negative for malignancy.  HISTORY OF PRESENTING ILLNESS:  Debra Saunders 75 y.o.  female anemia/likely secondary to CKD stage III is here for follow-up/review results of the CT scan-ordered for right quadrant abdominal pain..  Patient denies any blood in stools or black-colored stools.  Denies any nausea vomiting.  States abdominal pain is improved intermittent.  Chronic mild swelling in legs.  Continues been oxygen.   Review of Systems  Constitutional: Positive for malaise/fatigue. Negative for chills, diaphoresis, fever and weight loss.  HENT: Negative for nosebleeds  and sore throat.   Eyes: Negative for double vision.  Respiratory: Positive for shortness of breath. Negative for cough, hemoptysis, sputum production and wheezing.   Cardiovascular: Negative for chest pain, palpitations, orthopnea and leg swelling.       Right chest wall/midaxillary area.  Tender to touch.  No rash.  Gastrointestinal: Positive for diarrhea. Negative for abdominal pain, blood in stool, constipation, heartburn, melena, nausea and vomiting.  Genitourinary: Negative for dysuria, frequency and urgency.  Musculoskeletal: Negative for back pain and joint pain.  Skin: Negative.  Negative for itching and rash.  Neurological: Negative for dizziness, tingling, focal weakness, weakness and headaches.  Endo/Heme/Allergies: Does not bruise/bleed easily.  Psychiatric/Behavioral: Negative for depression. The patient is not nervous/anxious and does not have insomnia.     MEDICAL HISTORY:  Past Medical History:  Diagnosis Date  . Arthritis   . Atrial fibrillation, persistent ([place])    a. s/p TEE-guided DCCV on 08/20/2017  . Cardiorenal syndrome    a. 09/2017 Creat rose to 2.7 w/ diuresis-->HD x 2.  . Chest pain    a. 01/2012 Cath: nonobs dzs;  c. 04/2014 MV: No ischemia.  . Chronic back pain   . Chronic combined systolic (congestive) and diastolic (congestive) heart failure ([place])    a. Dx 2005 @ [facility];  b. 02/2014 Echo: EF 55-60%; c. 07/2017: EF 30-35%, diffuse HK, trivial AI, severe MR, moderate TR; d. 09/2017 Echo: EF 40-45%; e. 02/2018 Echo: EF 55-60%, Gr1 DD; f. 08/2019 Echo: EF 60-65%, Gr2 DD, Nl RV fxn, RVSP 49.52mmHg. Mod-Sev MR. Mild to mod TR. Mod PR. RA ~ 48mmHg.  [name] Chronic respiratory failure (HCC)    a. on home O2  . COPD (chronic obstructive pulmonary disease) ([place])    a. Home O2 - 3lpm  . Gastritis   .  History of intracranial hemorrhage    a. 6/14 described by neurosurgery as "small frontal posttraumatic SAH " - pt was on xarelto @ the time.  . Hyperlipidemia   . Hypertension    . Interstitial lung disease ([place])   . Lipoma of back   . Lymphedema    a. Chronic LLE edema.  [name] NICM (nonischemic cardiomyopathy) ([place])    a. 09/2017 Echo: EF 40-45%; b. 09/2017 Cath: minor, insignificant CAD; c. 02/2018 Echo: EF 55-60%, Gr1 DD; d. 08/2019 Echo: EF 60-65%, Gr2 DD.  [name] Obesity   . PAH (pulmonary artery hypertension) ([place])    a. 09/2017 Echo: PASP 39mmHg; b. 09/2017 RHC: RA 23, RV 84/13, PA 84/29, PCWP 20, CO 3.89, CI 1.89. LVEDP 18.  . Presence of permanent cardiac pacemaker 01/09/2014  . Sepsis with metabolic encephalopathy ([place])   . Sleep apnea   . Streptococcal bacteremia   . Stroke ([place]) 2014  . Subarachnoid hemorrhage ([place]) 01/19/2013  . Symptomatic bradycardia    a. s/p BSX PPM.  . Thyroid nodule   . Type II diabetes mellitus ([place])   . Urinary incontinence     SURGICAL HISTORY: Past Surgical History:  Procedure Laterality Date  . ABDOMINAL HYSTERECTOMY  1969  . BREAST BIOPSY Right 10/28/2019   lymph node bx, [place] Bx, pending path   . CARDIAC CATHETERIZATION  2013   @ [place]: No obstructive CAD: Only 20% ostial left circumflex.  [name] CARDIOVERSION N/A 10/12/2017   Procedure: CARDIOVERSION;  Surgeon: Minna Merritts, MD;  Location: [facility]S;  Service: Cardiovascular;  Laterality: N/A;  . CATARACT EXTRACTION W/PHACO Right 09/19/2020   Procedure: CATARACT EXTRACTION PHACO AND INTRAOCULAR LENS PLACEMENT (IOC) RIGHT DIABETIC 3.95 00:43.0 9.2%;  Surgeon: Leandrew Koyanagi, MD;  Location: [place];  Service: Ophthalmology;  Laterality: Right;  Pt requests to be last sleep apnea  . CATARACT EXTRACTION W/PHACO Left 10/03/2020   Procedure: CATARACT EXTRACTION PHACO AND INTRAOCULAR LENS PLACEMENT ([place]) LEFT DIABETIC;  Surgeon: Leandrew Koyanagi, MD;  Location: [place];  Service: Ophthalmology;  Laterality: Left;  3.25 0:59.3 5.5%  . CHOLECYSTECTOMY    . COLONOSCOPY WITH PROPOFOL N/A 09/10/2018   Procedure: COLONOSCOPY WITH PROPOFOL;  Surgeon: Manya Silvas, MD;  Location: [facility] ENDOSCOPY;  Service: Endoscopy;  Laterality: N/A;  . CYST REMOVAL TRUNK  2002   BACK  . DIALYSIS/PERMA CATHETER INSERTION N/A 10/06/2017   Procedure: DIALYSIS/PERMA CATHETER INSERTION;  Surgeon: Katha Cabal, MD;  Location: [place] CV LAB;  Service: Cardiovascular;  Laterality: N/A;  . INSERT / REPLACE / REMOVE PACEMAKER    . lipoma removal  2014   back  . PACEMAKER INSERTION  [address] Scientific dual chamber pacemaker implanted by Dr Caryl Comes for symptomatic bradycardia  . PERMANENT PACEMAKER INSERTION N/A 01/09/2014   Procedure: PERMANENT PACEMAKER INSERTION;  Surgeon: Deboraha Sprang, MD;  [facility];  Service: Cardiovascular;  Laterality: N/A;  . RIGHT HEART CATH N/A 04/19/2018   Procedure: RIGHT HEART CATH;  Surgeon: Larey Dresser, MD;  Location: [place] CV LAB;  Service: Cardiovascular;  Laterality: N/A;  . RIGHT HEART CATH N/A 08/30/2020   Procedure: RIGHT HEART CATH;  Surgeon: Larey Dresser, MD;  Location: [place] CV LAB;  Service: Cardiovascular;  Laterality: N/A;  . RIGHT/LEFT HEART CATH AND CORONARY ANGIOGRAPHY N/A 10/19/2017   Procedure: RIGHT/LEFT HEART CATH AND CORONARY ANGIOGRAPHY;  Surgeon: Wellington Hampshire, MD;  Location: [place] CV LAB;  Service: Cardiovascular;  Laterality: N/A;  . TEE WITHOUT CARDIOVERSION N/A 08/20/2017   Procedure: TRANSESOPHAGEAL ECHOCARDIOGRAM (TEE) & Direct current cardioversion;  Surgeon: Minna Merritts, MD;  Location: [facility] ORS;  Service: Cardiovascular;  Laterality: N/A;  . TRIGGER FINGER RELEASE      SOCIAL HISTORY: Social History   Socioeconomic History  . Marital status: Divorced    Spouse name: Not on file  . Number of children: 4  . Years of education: 67  . Highest education level: High school graduate  Occupational History  . Occupation: Retired- factory work    Comment: exposed to Pharmacist, community  Tobacco Use  . Smoking status: Former Smoker    Packs/day: 0.30     Years: 2.00    Pack years: 0.60    Types: Cigarettes    Quit date: 06/23/1990    Years since quitting: 30.3  . Smokeless tobacco: Never Used  Vaping Use  . Vaping Use: Never used  Substance and Sexual Activity  . Alcohol use: No  . Drug use: No  . Sexual activity: Not Currently  Other Topics Concern  . Not on file  Social History Narrative   Lives in [place] with daughter. Has 4 children. No pets.      Work - Restaurant manager, fast food, retired      Diet - regular      02/14/2019: reports has Meals On Wheels delivered to her home;       Remote hx of smoking > 30 years; No alcohol-    Social Determinants of Health   Financial Resource Strain: Low Risk   . Difficulty of Paying Living Expenses: Not hard at all  Food Insecurity: No Food Insecurity  . Worried About Charity fundraiser in the Last Year: Never true  . Ran Out of Food in the Last Year: Never true  Transportation Needs: Unmet Transportation Needs  . Lack of Transportation (Medical): Yes  . Lack of Transportation (Non-Medical): Yes  Physical Activity: Not on file  Stress: Not on file  Social Connections: Not on file  Intimate Partner Violence: Not on file    FAMILY HISTORY: Family History  Problem Relation Age of Onset  . Heart disease Mother   . Hypertension Mother   . COPD Mother        was a smoker  . Thyroid disease Mother   . Hypertension Father   . Hypertension Sister   . Thyroid disease Sister   . Breast cancer Sister   . Hypertension Sister   . Thyroid disease Sister   . Bone cancer Sister   . Breast cancer Maternal Aunt   . Kidney disease Neg Hx   . Bladder Cancer Neg Hx     ALLERGIES:  is allergic to aspirin, other, and penicillins.  MEDICATIONS:  Current Outpatient Medications  Medication Sig Dispense Refill  . acetaminophen (TYLENOL) 325 MG tablet Take 325-650 mg by mouth every 6 (six) hours as needed for mild pain. Reported on 10/10/2015    . albuterol (PROVENTIL) (2.5 MG/3ML) 0.083%  nebulizer solution Take 3 mLs (2.5 mg total) by nebulization every 6 (six) hours. 75 mL 12  . apixaban (ELIQUIS) 5 MG TABS tablet Take 1 tablet (5 mg total) by mouth 2 (two) times daily. Needs appt for future refills 60 tablet 6  . cholestyramine (QUESTRAN) 4 GM/DOSE powder Take 4 g by mouth daily.    . digoxin (LANOXIN) 0.125 MG tablet Take 1 tablet (125 mcg total) by mouth daily. 90 tablet 3  .  gabapentin (NEURONTIN) 100 MG capsule TAKE 2 CAPSULES BY MOUTH THREE TIMES DAILY 180 capsule 0  . glucose blood test strip Use with True Metrix glucometer to check sugars BID 200 each 3  . KLOR-CON M20 20 MEQ tablet Take 1 tablet by mouth once daily 90 tablet 3  . Lancets MISC Use with True Metrix glucometer to check sugars BID 200 each 3  . macitentan (OPSUMIT) 10 MG tablet Take 1 tablet (10 mg total) by mouth daily. 30 tablet 11  . meclizine (ANTIVERT) 25 MG tablet Take 1 tablet (25 mg total) by mouth 3 (three) times daily as needed for dizziness. 30 tablet 0  . Menthol, Topical Analgesic, (BIOFREEZE EX) Apply 1 application topically 4 (four) times daily as needed (for pain).    . ondansetron (ZOFRAN) 8 MG tablet Take 1 tablet (8 mg total) by mouth every 8 (eight) hours as needed for nausea or vomiting. 30 tablet 2  . OXYGEN Inhale 4 L into the lungs continuous.    . rosuvastatin (CRESTOR) 10 MG tablet Take 1 tablet (10 mg total) by mouth daily. 90 tablet 3  . Selexipag (UPTRAVI) 1600 MCG TABS Take 1,600 mcg by mouth 2 (two) times daily.    . sildenafil (REVATIO) 20 MG tablet Take 1 tablet (20 mg total) by mouth 3 (three) times daily. 270 tablet 3  . spironolactone (ALDACTONE) 25 MG tablet Take 1 tablet (25 mg total) by mouth at bedtime. Please call for a office visit [phone] 90 tablet 2  . torsemide (DEMADEX) 20 MG tablet Take 3 tablets (60 mg total) by mouth 2 (two) times daily. (Patient taking differently: Take 60 mg by mouth 2 (two) times daily. Per Dr [name] reduce to 40 mg twice daily) 180  tablet 3   No current facility-administered medications for this visit.   Facility-Administered Medications Ordered in Other Visits  Medication Dose Route Frequency Provider Last Rate Last Admin  . 0.9 %  sodium chloride infusion   Intravenous Once Charlaine Dalton R, MD      . cyanocobalamin ((VITAMIN B-12)) injection 1,000 mcg  1,000 mcg Intramuscular Once Charlaine Dalton R, MD      . iron sucrose (VENOFER) injection 200 mg  200 mg Intravenous Once Cammie Sickle, MD          PHYSICAL EXAMINATION:   Vitals:   10/29/20 1315  BP: (!) 105/51  Pulse: (!) 59  Resp: 18  Temp: 97.9 F (36.6 C)   Filed Weights   10/29/20 1315  Weight: 194 lb (88 kg)    Physical Exam Constitutional:      Comments: Patient in wheelchair.  Alone.  She is on O2 nasal cannula.  HENT:     Head: Normocephalic and atraumatic.     Mouth/Throat:     Pharynx: No oropharyngeal exudate.  Eyes:     Pupils: Pupils are equal, round, and reactive to light.  Cardiovascular:     Rate and Rhythm: Normal rate and regular rhythm.  Pulmonary:     Effort: No respiratory distress.     Breath sounds: No wheezing.     Comments: Decreased air entry bilaterally.  No wheeze or crackles. Abdominal:     General: Bowel sounds are normal. There is no distension.     Palpations: Abdomen is soft. There is no mass.     Tenderness: There is no abdominal tenderness. There is no guarding or rebound.  Musculoskeletal:        General: No tenderness. Normal  range of motion.     Cervical back: Normal range of motion and neck supple.  Skin:    General: Skin is warm.  Neurological:     Mental Status: She is alert and oriented to person, place, and time.  Psychiatric:        Mood and Affect: Affect normal.     LABORATORY DATA:  I have reviewed the data as listed Lab Results  Component Value Date   WBC 5.2 10/24/2020   HGB 9.6 (L) 10/24/2020   HCT 32.4 (L) 10/24/2020   MCV 91.5 10/24/2020   PLT 191  10/24/2020   Recent Labs    01/30/20 1127 01/30/20 1127 03/26/20 1317 04/10/20 1354 04/20/20 1435 06/13/20 1621 06/25/20 1334 08/24/20 1506 08/27/20 1257 08/30/20 1117 08/30/20 1118 10/15/20 0918 10/24/20 1455  NA 139  --  138 139  --  140   < > 140 140 144 144  --  140  K 3.8  --  3.7 3.8  --  4.0   < > 4.2 4.0 3.8 3.7  --  4.4  CL 103  --  104 102  --  102   < > 101 104  --   --   --  102  CO2 26  --  27 31  --  25   < > 30 27  --   --   --  30  GLUCOSE 97  --  113* 99  --  111*   < > 110* 113*  --   --   --  104*  BUN 26*  --  26* 30*  --  23   < > 23 32*  --   --   --  27*  CREATININE 1.13*  --  0.89 1.08  --  1.02*   < > 1.01* 1.00  --   --  1.20* 0.95  CALCIUM 8.6*  --  8.4* 8.9  --  8.8   < > 9.5 8.6*  --   --   --  8.9  GFRNONAA 47*  --  >60  --   --  54*   < > 57* 58*  --   --   --  >60  GFRAA 55*  --  >60  --   --  62  --   --   --   --   --   --   --   PROT  --    < > 7.3 6.9  --   --   --   --  7.4  --   --   --  7.7  ALBUMIN  --    < > 3.6 4.0  --   --   --   --  3.8  --   --   --  3.9  AST  --    < > 16 13  --   --   --   --  15  --   --   --  19  ALT  --    < > 19 7  --   --   --   --  12  --   --   --  17  ALKPHOS  --    < > 137* 133* 163*  --   --   --  107  --   --   --  115  BILITOT  --    < > 0.6 0.4  --   --   --   --  0.6  --   --   --  0.5  BILIDIR  --   --   --   --   --   --   --   --  0.1  --   --   --  <0.1  IBILI  --   --   --   --   --   --   --   --  0.5  --   --   --  NOT CALCULATED   < > = values in this interval not displayed.     CT CHEST ABDOMEN PELVIS W CONTRAST  Result Date: 10/15/2020 CLINICAL DATA:  Chest wall and right upper quadrant pain.  Diarrhea. EXAM: CT CHEST, ABDOMEN, AND PELVIS WITH CONTRAST TECHNIQUE: Multidetector CT imaging of the chest, abdomen and pelvis was performed following the standard protocol during bolus administration of intravenous contrast. CONTRAST:  177mL OMNIPAQUE IOHEXOL 300 MG/ML  SOLN COMPARISON:  CT chest  08/17/2019 and CT abdomen pelvis 04/14/2019. FINDINGS: CT CHEST FINDINGS Cardiovascular: Atherosclerotic calcification of the aorta, aortic valve and coronary arteries. Pulmonic trunk and heart are enlarged. No pericardial effusion. Mediastinum/Nodes: Thyroid is enlarged and contains multiple subcentimeter low-attenuation nodules. No follow-up recommended. (Ref: J Am Coll Radiol. 2015 Feb;12(2): 143-50).Calcified mediastinal lymph nodes. No pathologically enlarged mediastinal, hilar or axillary lymph nodes. Esophagus is grossly unremarkable. Lungs/Pleura: Patchy pulmonary parenchymal ground-glass, coarsening, bronchiectasis and scattered subpleural reticular densities with mosaic attenuation, unchanged from 08/17/2019. Probable calcified granuloma in the medial left lower lobe. Lungs are otherwise clear. No pleural fluid. Airway is unremarkable. Musculoskeletal: Degenerative changes in the spine. No worrisome lytic or sclerotic lesions. CT ABDOMEN PELVIS FINDINGS Hepatobiliary: Liver is slightly enlarged, 19.3 cm. Cholecystectomy. No biliary ductal dilatation. Pancreas: Negative. Spleen: Negative. Adrenals/Urinary Tract: Adrenal glands and kidneys are unremarkable. Ureters are decompressed. Bladder is grossly unremarkable. Stomach/Bowel: Stomach, small bowel and colon are unremarkable. Status post appendectomy by report. Vascular/Lymphatic: Atherosclerotic calcification of the aorta. IVC filter. No pathologically enlarged lymph nodes. Reproductive: Low-attenuation lesions in both ovaries measure up to 3.6 cm on the left, similar to 04/14/2019. Hysterectomy. Other: No free fluid.  Mesenteries and peritoneum are unremarkable. Musculoskeletal: Degenerative changes in the spine. No worrisome lytic or sclerotic lesions. IMPRESSION: 1. No findings to explain the patient's pain. 2. 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 3. Mild hepatomegaly. 4. Low-attenuation lesions in the ovaries bilaterally, last evaluated by ultrasound on 08/17/2019. 5. Aortic atherosclerosis (ICD10-I70.0). Coronary artery calcification. 6. Enlarged pulmonic trunk, indicative of pulmonary arterial hypertension. Electronically Signed   By: Lorin Picket M.D.   On: 10/15/2020 09:55    Iron deficiency # Chronic Anemia [BL10-11]-iron deficiency/? CKD stage III [today GFR >60] currently s/p Venofe. Hemoglobin today is 9.6 improved from around 8. MAY 1OX,0960- Iron sat-16%.  Proceed with Venofer today.   # RUQ pain-CT scan chest/abdomen pelvis-negative for any acute process.  Currently pain is resolved.  Monitor for now.  # Etiology of anemia- ? Chronic diarrhea/malabsopbtion/CKD-iron saturation 18%.  #? CKD stage III-GFR 60- STABLE.[name]    #History of DVT/s/p IVC filter; ?  A. Fib-Eliquis-STABLE>   #Borderline low B12-230; s/p B12 injection. B12 injection today.   # DISPOSITION: will call with results - [phone]/home number # venofer/b12 today # venofer in 2 weeks # Follow up in 3 months-MD-labs- cbc;bmp; possible Venofer / B12 injection- Dr.B   Cc; Tammi Klippel  All questions were  answered. The patient knows to call the clinic with any problems, questions or concerns.    Cammie Sickle, MD 10/29/2020 1:46 PM

## 2020-10-30 ENCOUNTER — Other Ambulatory Visit (HOSPITAL_COMMUNITY): Payer: Self-pay

## 2020-10-31 ENCOUNTER — Encounter (HOSPITAL_COMMUNITY): Payer: Self-pay

## 2020-10-31 ENCOUNTER — Other Ambulatory Visit: Payer: Self-pay

## 2020-10-31 NOTE — Progress Notes (Signed)
Today had a home visit with [name].  She states doing ok just short of breath all the time.  She has been trying to do some light house keeping since her grandson is working from the office this week instead of her home.  He keeps her from doing things because he does them.  She states her breathing is just getting worse due to her pulmonary fibrosis.  Appetite is good.  She has been cooking a couple days a week.  She watches her high sodium foods and how much fluids she drinks.  Legs edema is about normal.  Weight has been gradually going up, but she has been eating more.  She went for infusion and advised her iron and hemoglobin was low.  She is aware of up coming appts.  She has a HF clinic appt coming and aware that transportation is set up.  She has family support and everything for daily living.   She denies any chest pain, headaches or dizziness today.  Mood appears good. She worries about her family especially her daughter with sick husband.   Refilled her med boxes, called in her opsumit and sildinafil-will be delivered over night, she will have to place in her boxes.  She is complianct with her medications. Called in spirolactone to local pharmacy. Will continue to visit for heart failure, diet and medication compliance.   [place] [phone]

## 2020-10-31 NOTE — Patient Outreach (Signed)
[facility]) Care Management  10/31/2020  Debra Saunders 1946-02-29 [id number]   Telephone call to patient for disease management follow up.   No answer.  HIPAA compliant voice message left.    Plan: If no return call, RN CM will attempt patient again in August.   Namine Beahm J Jaquanna Ballentine, RN, MSN [place] Management Care Management Coordinator Direct Line [phone] Cell [phone] Toll Free: [phone]  Fax: [phone]

## 2020-11-01 ENCOUNTER — Ambulatory Visit: Payer: Self-pay

## 2020-11-12 ENCOUNTER — Inpatient Hospital Stay: Payer: Medicare HMO

## 2020-11-12 VITALS — BP 109/56 | HR 59 | Temp 96.4°F | Resp 20

## 2020-11-12 DIAGNOSIS — E538 Deficiency of other specified B group vitamins: Secondary | ICD-10-CM | POA: Diagnosis not present

## 2020-11-12 DIAGNOSIS — I4819 Other persistent atrial fibrillation: Secondary | ICD-10-CM | POA: Diagnosis not present

## 2020-11-12 DIAGNOSIS — E611 Iron deficiency: Secondary | ICD-10-CM

## 2020-11-12 DIAGNOSIS — I130 Hypertensive heart and chronic kidney disease with heart failure and stage 1 through stage 4 chronic kidney disease, or unspecified chronic kidney disease: Secondary | ICD-10-CM | POA: Diagnosis not present

## 2020-11-12 DIAGNOSIS — Z79899 Other long term (current) drug therapy: Secondary | ICD-10-CM | POA: Diagnosis not present

## 2020-11-12 DIAGNOSIS — D509 Iron deficiency anemia, unspecified: Secondary | ICD-10-CM | POA: Diagnosis not present

## 2020-11-12 DIAGNOSIS — N186 End stage renal disease: Secondary | ICD-10-CM | POA: Diagnosis not present

## 2020-11-12 DIAGNOSIS — Z7901 Long term (current) use of anticoagulants: Secondary | ICD-10-CM | POA: Diagnosis not present

## 2020-11-12 DIAGNOSIS — Z8673 Personal history of transient ischemic attack (TIA), and cerebral infarction without residual deficits: Secondary | ICD-10-CM | POA: Diagnosis not present

## 2020-11-12 DIAGNOSIS — I132 Hypertensive heart and chronic kidney disease with heart failure and with stage 5 chronic kidney disease, or end stage renal disease: Secondary | ICD-10-CM | POA: Diagnosis not present

## 2020-11-12 MED ORDER — IRON SUCROSE 20 MG/ML IV SOLN
200.0000 mg | Freq: Once | INTRAVENOUS | Status: AC
  Administered 2020-11-12: 200 mg via INTRAVENOUS
  Filled 2020-11-12: qty 10

## 2020-11-12 MED ORDER — SODIUM CHLORIDE 0.9 % IV SOLN
Freq: Once | INTRAVENOUS | Status: AC
  Filled 2020-11-12: qty 250

## 2020-11-12 NOTE — Patient Instructions (Signed)

## 2020-11-13 ENCOUNTER — Other Ambulatory Visit: Payer: Self-pay | Admitting: Family Medicine

## 2020-11-13 ENCOUNTER — Encounter (HOSPITAL_COMMUNITY): Payer: Self-pay

## 2020-11-13 ENCOUNTER — Other Ambulatory Visit (HOSPITAL_COMMUNITY): Payer: Self-pay

## 2020-11-13 NOTE — Progress Notes (Signed)
Today had a home visit with [name].  She is complaining about her eyes still bothering her since cateract surgery, she states she has an appt coming up.  She states she is down 4 lbs.  She states her breathing is getting worse.  She is taking all her medications and aware of how to take them.  Filled her boxes up, opsumit, uptravi and sildinifil attempted to call in refills but will not refill til next week and has to call back.  Will visit again next week to call them.  She denies any chest pain, or dizziness.  She does light chores around the house and cooks.  She is aware of up coming appts.  Will continue to visit for heart failure, diet and medication compliance.   [place] [phone]

## 2020-11-15 ENCOUNTER — Telehealth: Payer: Self-pay | Admitting: Family Medicine

## 2020-11-15 MED ORDER — GABAPENTIN 100 MG PO CAPS: 200.0000 mg | ORAL_CAPSULE | Freq: Three times a day (TID) | ORAL | 1 refills | Status: DC

## 2020-11-15 NOTE — Telephone Encounter (Signed)
-----   Message from De Hollingshead, [place] sent at 11/15/2020 10:42 AM EDT ----- Can you send that? It's not on my CPP. I'll let [name] know.   [name] ----- Message ----- From: [name], MD Sent: 11/15/2020  10:36 AM EDT To: De Hollingshead, RPH-CPP  I am fine giving a 90 day supply with 1 refill at a time.  ----- Message ----- From: De Hollingshead, RPH-CPP Sent: 11/14/2020   3:55 PM EDT To: [name], MD  Would you be comfortable with refills? Fransisco Beau never seems to put refills on anything when they come through the Refill pool.   [name] ----- Message ----- From: [name], EMT Sent: 11/14/2020   3:49 PM EDT To: De Hollingshead, RPH-CPP  Is there any way that she can get refills on her gabapentin. (maybe 6 months) The doctor never puts refills on it, every month we have to go through a lot to get refills for this.   [name] [employer]

## 2020-11-16 ENCOUNTER — Telehealth (HOSPITAL_COMMUNITY): Payer: Self-pay | Admitting: Pharmacy Technician

## 2020-11-16 NOTE — Telephone Encounter (Signed)
Advanced Heart Failure Patient Advocate Encounter  Prior Authorization for Opsumit has been submitted and approved.    Effective dates: 10/26/20 through 06/22/21  Charlann Boxer, CPhT

## 2020-11-21 ENCOUNTER — Other Ambulatory Visit: Payer: Self-pay | Admitting: Family Medicine

## 2020-11-21 DIAGNOSIS — E1122 Type 2 diabetes mellitus with diabetic chronic kidney disease: Secondary | ICD-10-CM

## 2020-11-27 ENCOUNTER — Ambulatory Visit (INDEPENDENT_AMBULATORY_CARE_PROVIDER_SITE_OTHER): Payer: Medicare HMO

## 2020-11-27 DIAGNOSIS — I428 Other cardiomyopathies: Secondary | ICD-10-CM

## 2020-11-28 ENCOUNTER — Encounter (HOSPITAL_COMMUNITY): Payer: Self-pay

## 2020-11-28 ENCOUNTER — Other Ambulatory Visit (HOSPITAL_COMMUNITY): Payer: Self-pay

## 2020-11-28 NOTE — Progress Notes (Signed)
Today had a home visit with Debra Saunders.  She is having a good day she said.  She has not eaten anything yet, she is a person that gets up late and starts her day late.  She appears to be taking her medications per her boxes.  She has all her medications and aware of how to take them.  Filled 2 weeks of meds up for her.  Called in digoxin and rouvastatin to local [facility].  She states she is gaining weight, she states been eating more, especially sweets.  She does only eat about 1 meal a day and a small breakfast.  She is drinking fluids and watching the amount.  Right leg has no edema, left leg has more edema in it.  Asked her when last visited with vascular, she state 2years ago.  She states she will make appt with [facility] for check up.  She is aware of change in her appt with Dr [name] at [facility] clinic from next week to next month. She denies increased shortness of breath today, headaches, dizziness or chest pain.  Denies fullness feeling in belly.  Will continue to visit for heart failure, diet and medication compliance.   [place] [phone]

## 2020-11-29 LAB — CUP PACEART REMOTE DEVICE CHECK
Battery Remaining Longevity: 72 mo
Battery Remaining Percentage: 76 %
Brady Statistic RA Percent Paced: 68 %
Brady Statistic RV Percent Paced: 99 %
Date Time Interrogation Session: 20220608162200
Implantable Lead Implant Date: 20150720
Implantable Lead Implant Date: 20150720
Implantable Lead Location: 753859
Implantable Lead Location: 753860
Implantable Lead Model: 5076
Implantable Lead Model: 5076
Implantable Pulse Generator Implant Date: 20150720
Lead Channel Impedance Value: 377 Ohm
Lead Channel Impedance Value: 540 Ohm
Lead Channel Pacing Threshold Amplitude: 0.8 V
Lead Channel Pacing Threshold Pulse Width: 0.4 ms
Lead Channel Setting Pacing Amplitude: 2 V
Lead Channel Setting Pacing Amplitude: 2.4 V
Lead Channel Setting Pacing Pulse Width: 0.4 ms
Lead Channel Setting Sensing Sensitivity: 2.5 mV
Pulse Gen Serial Number: 390330

## 2020-12-07 ENCOUNTER — Encounter (HOSPITAL_COMMUNITY): Payer: Medicare HMO | Admitting: Cardiology

## 2020-12-13 ENCOUNTER — Encounter (HOSPITAL_COMMUNITY): Payer: Self-pay

## 2020-12-13 ENCOUNTER — Other Ambulatory Visit (HOSPITAL_COMMUNITY): Payer: Self-pay

## 2020-12-13 NOTE — Progress Notes (Signed)
Today had a home visit with Debra Saunders.  She states tired today.  She states she has been home alone this week, her grandson working from office this week.  She is concerned abut him.  We talked about family, she appears in a good mood.  Appetite is good.  She tries to watch low sodium foods.  She is watching how much fluids she intakes.  She has all her medications, refilled her med boxes.  Called in refills for potassium and eliquis, she is aware to get someone to pick them up.  She will refill her opsumit and sildinafil when they call.  She denies any problems such as chest pain, headaches, dizziness or increased shortness of breath.  She states has a little more fatigue today than yesterday or day before.  She still not sleeping well at night and sleeps late in the day.  She starts her day around noon time.  She is aware of up coming appts.  Will continue to visit for heart failure, diet and medication management.   [place] [phone]

## 2020-12-14 ENCOUNTER — Telehealth: Payer: Self-pay | Admitting: Licensed Clinical Social Worker

## 2020-12-14 NOTE — Telephone Encounter (Signed)
Called pt at [phone]; number is her daughter [name]'s. Re-introduced self, role, reason for call.   She confirmed pt aware of Heart/Vascular appt at [facility] clinic but was not aware of the PCP appt Monday, concerned they wont be able to get a ride in time/to that office. I was able to confirm pt in person by calling clinic and that pt can get a ride to appt via [employer] (confirmed by [employer] who will arrange). Pt daughter given the above information and number for clinic and ride service. No additional questions/concerns with upcoming appts at this time.   Westley Hummer, MSW, [place]  [phone]

## 2020-12-17 ENCOUNTER — Ambulatory Visit (INDEPENDENT_AMBULATORY_CARE_PROVIDER_SITE_OTHER): Payer: Medicare HMO

## 2020-12-17 VITALS — Ht 60.0 in | Wt 197.0 lb

## 2020-12-17 DIAGNOSIS — Z0000 Encounter for general adult medical examination without abnormal findings: Secondary | ICD-10-CM | POA: Diagnosis not present

## 2020-12-17 NOTE — Progress Notes (Signed)
Subjective:   ANJU SERENO is a 75 y.o. female who presents for Medicare Annual (Subsequent) preventive examination.  Review of Systems    No ROS.  Medicare Wellness Virtual Visit.  Visual/audio telehealth visit, UTA vital signs.   See social history for additional risk factors.   Cardiac Risk Factors include: advanced age (>26men, >62 women);diabetes mellitus;hypertension     Objective:    Today's Vitals   12/17/20 1412  Weight: 197 lb (89.4 kg)  Height: 5' (1.524 m)   Body mass index is 38.47 kg/m.  Advanced Directives 12/17/2020 10/03/2020 09/19/2020 08/30/2020 08/27/2020 07/12/2020 04/10/2020  Does Patient Have a Medical Advance Directive? Yes Yes Yes No No No No  Type of Paramedic of [place];Living will [place];Living will [place];Living will - - - -  Does patient want to make changes to medical advance directive? No - Patient declined No - Patient declined No - Guardian declined - - - -  Copy of [place] in Chart? No - copy requested No - copy requested No - copy requested - - - -  Would patient like information on creating a medical advance directive? - - - No - Patient declined No - Patient declined - No - Patient declined  Pre-existing out of facility DNR order (yellow form or pink MOST form) - - - - - - -    Current Medications (verified) Outpatient Encounter Medications as of 12/17/2020  Medication Sig   acetaminophen (TYLENOL) 325 MG tablet Take 325-650 mg by mouth every 6 (six) hours as needed for mild pain. Reported on 10/10/2015   albuterol (PROVENTIL) (2.5 MG/3ML) 0.083% nebulizer solution Take 3 mLs (2.5 mg total) by nebulization every 6 (six) hours.   apixaban (ELIQUIS) 5 MG TABS tablet Take 1 tablet (5 mg total) by mouth 2 (two) times daily. Needs appt for future refills   cholestyramine (QUESTRAN) 4 GM/DOSE powder Take 4 g by mouth daily.   digoxin (LANOXIN) 0.125 MG tablet  Take 1 tablet (125 mcg total) by mouth daily.   gabapentin (NEURONTIN) 100 MG capsule Take 2 capsules (200 mg total) by mouth 3 (three) times daily.   KLOR-CON M20 20 MEQ tablet Take 1 tablet by mouth once daily   Lancets MISC Use with True Metrix glucometer to check sugars BID   macitentan (OPSUMIT) 10 MG tablet Take 1 tablet (10 mg total) by mouth daily.   meclizine (ANTIVERT) 25 MG tablet Take 1 tablet (25 mg total) by mouth 3 (three) times daily as needed for dizziness.   Menthol, Topical Analgesic, (BIOFREEZE EX) Apply 1 application topically 4 (four) times daily as needed (for pain).   ondansetron (ZOFRAN) 8 MG tablet Take 1 tablet (8 mg total) by mouth every 8 (eight) hours as needed for nausea or vomiting.   OXYGEN Inhale 4 L into the lungs continuous.   rosuvastatin (CRESTOR) 10 MG tablet Take 1 tablet (10 mg total) by mouth daily.   Selexipag (UPTRAVI) 1600 MCG TABS Take 1,600 mcg by mouth 2 (two) times daily.   sildenafil (REVATIO) 20 MG tablet Take 1 tablet (20 mg total) by mouth 3 (three) times daily.   spironolactone (ALDACTONE) 25 MG tablet Take 1 tablet (25 mg total) by mouth at bedtime. Please call for a office visit [phone]   torsemide (DEMADEX) 20 MG tablet Take 3 tablets (60 mg total) by mouth 2 (two) times daily. (Patient taking differently: Take 60 mg by mouth 2 (  two) times daily. Per Dr [name] reduce to 40 mg twice daily)   TRUE METRIX BLOOD GLUCOSE TEST test strip CHECK BLOOD GLUCOSE TWICE DAILY   No facility-administered encounter medications on file as of 12/17/2020.    Allergies (verified) Aspirin, Other, and Penicillins   History: Past Medical History:  Diagnosis Date   Arthritis    Atrial fibrillation, persistent ([place])    a. s/p TEE-guided DCCV on 08/20/2017   Cardiorenal syndrome    a. 09/2017 Creat rose to 2.7 w/ diuresis-->HD x 2.   Chest pain    a. 01/2012 Cath: nonobs dzs;  c. 04/2014 MV: No ischemia.   Chronic back pain    Chronic combined systolic  (congestive) and diastolic (congestive) heart failure ([place])    a. Dx 2005 @ [facility];  b. 02/2014 Echo: EF 55-60%; c. 07/2017: EF 30-35%, diffuse HK, trivial AI, severe MR, moderate TR; d. 09/2017 Echo: EF 40-45%; e. 02/2018 Echo: EF 55-60%, Gr1 DD; f. 08/2019 Echo: EF 60-65%, Gr2 DD, Nl RV fxn, RVSP 49.82mmHg. Mod-Sev MR. Mild to mod TR. Mod PR. RA ~ 73mmHg.   Chronic respiratory failure (HCC)    a. on home O2   COPD (chronic obstructive pulmonary disease) ([place])    a. Home O2 - 3lpm   Gastritis    History of intracranial hemorrhage    a. 6/14 described by neurosurgery as "small frontal posttraumatic SAH " - pt was on xarelto @ the time.   Hyperlipidemia    Hypertension    Interstitial lung disease (HCC)    Lipoma of back    Lymphedema    a. Chronic LLE edema.   NICM (nonischemic cardiomyopathy) ([place])    a. 09/2017 Echo: EF 40-45%; b. 09/2017 Cath: minor, insignificant CAD; c. 02/2018 Echo: EF 55-60%, Gr1 DD; d. 08/2019 Echo: EF 60-65%, Gr2 DD.   Obesity    PAH (pulmonary artery hypertension) ([place])    a. 09/2017 Echo: PASP 54mmHg; b. 09/2017 RHC: RA 23, RV 84/13, PA 84/29, PCWP 20, CO 3.89, CI 1.89. LVEDP 18.   Presence of permanent cardiac pacemaker 01/09/2014   Sepsis with metabolic encephalopathy ([place])    Sleep apnea    Streptococcal bacteremia    Stroke [facility]) 2014   Subarachnoid hemorrhage ([place]) 01/19/2013   Symptomatic bradycardia    a. s/p BSX PPM.   Thyroid nodule    Type II diabetes mellitus ([place])    Urinary incontinence    Past Surgical History:  Procedure Laterality Date   ABDOMINAL HYSTERECTOMY  1969   BREAST BIOPSY Right 10/28/2019   lymph node bx, [place] Bx, pending path    CARDIAC CATHETERIZATION  2013   @ [place]: No obstructive CAD: Only 20% ostial left circumflex.   CARDIOVERSION N/A 10/12/2017   Procedure: CARDIOVERSION;  Surgeon: Minna Merritts, MD;  Location: [facility] ORS;  Service: Cardiovascular;  Laterality: N/A;   CATARACT EXTRACTION W/PHACO Right 09/19/2020   Procedure:  CATARACT EXTRACTION PHACO AND INTRAOCULAR LENS PLACEMENT (IOC) RIGHT DIABETIC 3.95 00:43.0 9.2%;  Surgeon: Leandrew Koyanagi, MD;  Location: [place];  Service: Ophthalmology;  Laterality: Right;  Pt requests to be last sleep apnea   CATARACT EXTRACTION W/PHACO Left 10/03/2020   Procedure: CATARACT EXTRACTION PHACO AND INTRAOCULAR LENS PLACEMENT ([place]) LEFT DIABETIC;  Surgeon: Leandrew Koyanagi, MD;  Location: [place];  Service: Ophthalmology;  Laterality: Left;  3.25 0:59.3 5.5%   CHOLECYSTECTOMY     COLONOSCOPY WITH PROPOFOL N/A 09/10/2018   Procedure: COLONOSCOPY WITH PROPOFOL;  Surgeon: Vira Agar,  Gavin Pound, MD;  Location: [facility] ENDOSCOPY;  Service: Endoscopy;  Laterality: N/A;   CYST REMOVAL TRUNK  2002   BACK   DIALYSIS/PERMA CATHETER INSERTION N/A 10/06/2017   Procedure: DIALYSIS/PERMA CATHETER INSERTION;  Surgeon: Katha Cabal, MD;  Location: [place] CV LAB;  Service: Cardiovascular;  Laterality: N/A;   INSERT / REPLACE / REMOVE PACEMAKER     lipoma removal  2014   back   PACEMAKER INSERTION  2015   [employer] dual chamber pacemaker implanted by Dr Caryl Comes for symptomatic bradycardia   PERMANENT PACEMAKER INSERTION N/A 01/09/2014   Procedure: PERMANENT PACEMAKER INSERTION;  Surgeon: Deboraha Sprang, MD;  Location: [facility] CATH LAB;  Service: Cardiovascular;  Laterality: N/A;   RIGHT HEART CATH N/A 04/19/2018   Procedure: RIGHT HEART CATH;  Surgeon: Larey Dresser, MD;  Location: [place] CV LAB;  Service: Cardiovascular;  Laterality: N/A;   RIGHT HEART CATH N/A 08/30/2020   Procedure: RIGHT HEART CATH;  Surgeon: Larey Dresser, MD;  Location: [place] CV LAB;  Service: Cardiovascular;  Laterality: N/A;   RIGHT/LEFT HEART CATH AND CORONARY ANGIOGRAPHY N/A 10/19/2017   Procedure: RIGHT/LEFT HEART CATH AND CORONARY ANGIOGRAPHY;  Surgeon: Wellington Hampshire, MD;  Location: [place] CV LAB;  Service: Cardiovascular;  Laterality: N/A;   TEE  WITHOUT CARDIOVERSION N/A 08/20/2017   Procedure: TRANSESOPHAGEAL ECHOCARDIOGRAM (TEE) & Direct current cardioversion;  Surgeon: Minna Merritts, MD;  Location: [facility] ORS;  Service: Cardiovascular;  Laterality: N/A;   TRIGGER FINGER RELEASE     Family History  Problem Relation Age of Onset   Heart disease Mother    Hypertension Mother    COPD Mother        was a smoker   Thyroid disease Mother    Hypertension Father    Hypertension Sister    Thyroid disease Sister    Breast cancer Sister    Hypertension Sister    Thyroid disease Sister    Bone cancer Sister    Breast cancer Maternal Aunt    Kidney disease Neg Hx    Bladder Cancer Neg Hx    Social History   Socioeconomic History   Marital status: Divorced    Spouse name: Not on file   Number of children: 4   Years of education: 12   Highest education level: High school graduate  Occupational History   Occupation: Retired- factory work    Comment: exposed to Software engineer dust  Tobacco Use   Smoking status: Former    Packs/day: 0.30    Years: 2.00    Pack years: 0.60    Types: Cigarettes    Quit date: 06/23/1990    Years since quitting: 30.5   Smokeless tobacco: Never  Vaping Use   Vaping Use: Never used  Substance and Sexual Activity   Alcohol use: No   Drug use: No   Sexual activity: Not Currently  Other Topics Concern   Not on file  Social History Narrative   Lives in [place] with daughter. Has 4 children. No pets.      Work - Restaurant manager, fast food, retired      Diet - regular      02/14/2019: reports has Meals On Wheels delivered to her home;       Remote hx of smoking > 30 years; No alcohol-    Social Determinants of Health   Financial Resource Strain: Low Risk    Difficulty of Paying Living Expenses: Not very hard  Food Insecurity:  No Food Insecurity   Worried About Charity fundraiser in the Last Year: Never true   Ran Out of Food in the Last Year: Never true  Transportation Needs: No Transportation Needs    Lack of Transportation (Medical): No   Lack of Transportation (Non-Medical): No  Physical Activity: Not on file  Stress: No Stress Concern Present   Feeling of Stress : Only a little  Social Connections: Socially Isolated   Frequency of Communication with Friends and Family: More than three times a week   Frequency of Social Gatherings with Friends and Family: Once a week   Attends Religious Services: Never   Marine scientist or Organizations: No   Attends Music therapist: Never   Marital Status: Divorced    Tobacco Counseling Counseling given: Not Answered   Clinical Intake:  Pre-visit preparation completed: Yes        Diabetes: Yes (Followed by pcp)  How often do you need to have someone help you when you read instructions, pamphlets, or other written materials from your doctor or pharmacy?: 1 - Never   Interpreter Needed?: No      Activities of Daily Living In your present state of health, do you have any difficulty performing the following activities: 12/17/2020 10/03/2020  Hearing? N N  Vision? N N  Difficulty concentrating or making decisions? N N  Walking or climbing stairs? Y N  Dressing or bathing? N N  Doing errands, shopping? Y -  Comment Does not drive -  Preparing Food and eating ? N -  Using the Toilet? N -  In the past six months, have you accidently leaked urine? Y -  Comment Managed with daily brief. Followed by Urology. -  Do you have problems with loss of bowel control? Y -  Comment Managed with daily brief. Followed by GI. -  Managing your Medications? Y -  Comment Daughter and aide assist -  Managing your Finances? Y -  Comment Daughter assist as needed -  Housekeeping or managing your Housekeeping? N -  Some recent data might be hidden    Patient Care Team: [name], MD as PCP - General (Family Medicine) Wellington Hampshire, MD as PCP - Cardiology (Cardiology) Deboraha Sprang, MD as PCP - Electrophysiology  (Cardiology) Thom Chimes, MD as Referring Physician (Allergy) Juanito Doom, MD as Referring Physician (Internal Medicine) De Hollingshead, RPH-CPP as Pharmacist (Pharmacist) Cammie Sickle, MD as Consulting Physician (Hematology and Oncology) Jon Billings, RN as [place] any recent Medical Services you may have received from other than [facility] providers in the past year (date may be approximate).     Assessment:   This is a routine wellness examination for Joleigh.  I connected with Ambreen today by telephone and verified that I am speaking with the correct person using two identifiers. Location patient: home Location provider: work Persons participating in the virtual visit: patient, Marine scientist.    I discussed the limitations, risks, security and privacy concerns of performing an evaluation and management service by telephone and the availability of in person appointments. The patient expressed understanding and verbally consented to this telephonic visit.    Interactive audio and video telecommunications were attempted between this provider and patient, however failed, due to patient having technical difficulties OR patient did not have access to video capability.  We continued and completed visit with audio only.  Some vital signs may be absent or  patient reported.   Hearing/Vision screen Hearing Screening - Comments:: Patient is able to hear conversational tones without difficulty.  No issues reported.    Vision Screening - Comments:: Cataract extraction, bilateral. No retinopathy reported. They have seen their ophthalmologist in the last 12 months.    Dietary issues and exercise activities discussed:  Regular diet; plans to try the optavia diet Good water intake   Goals Addressed               This Visit's Progress     Patient Stated     Increase physical activity (pt-stated)         Depression Screen PHQ  2/9 Scores 12/17/2020 07/25/2020 04/10/2020 02/14/2020 12/15/2019 11/04/2019 09/27/2019  PHQ - 2 Score 0 0 0 0 0 0 0  PHQ- 9 Score - - - - - - -    Fall Risk Fall Risk  12/17/2020 04/30/2020 02/14/2020 12/15/2019 11/04/2019  Falls in the past year? 0 0 0 0 0  Comment - - - - -  Number falls in past yr: 0 0 - 0 -  Comment - - - - -  Injury with Fall? 0 - - - -  Comment - - - - -  Risk for fall due to : - History of fall(s) - - -  Follow up Falls evaluation completed Falls evaluation completed - Falls evaluation completed Falls evaluation completed    [place]: Handrails in use when climbing stairs? Yes Home free of loose throw rugs in walkways, pet beds, electrical cords, etc? Yes  Adequate lighting in your home to reduce risk of falls? Yes   ASSISTIVE DEVICES UTILIZED TO PREVENT FALLS: Use of a cane, walker or w/c? Yes , rolling walker   TIMED UP AND GO: Was the test performed? No .   Cognitive Function:  Patient is alert and oriented x3.  Denies difficulty focusing, making decision, memory loss.  MMSE/6CIT deferred. Normal by communication/observation.    6CIT Screen 12/15/2019 07/02/2018 07/01/2017  What Year? 0 points 0 points 0 points  What month? 0 points 0 points 0 points  What time? 0 points 0 points 0 points  Count back from 20 - 0 points 0 points  Months in reverse 0 points 0 points 0 points  Repeat phrase - 0 points 0 points  Total Score - 0 0    Immunizations Immunization History  Administered Date(s) Administered   Fluad Quad(high Dose 65+) 07/08/2019, 04/10/2020   Influenza, High Dose Seasonal PF 07/23/2016, 06/15/2017, 07/02/2018   Influenza, Seasonal, Injecte, Preservative Fre 07/27/2012   Influenza,inj,Quad PF,6+ Mos 06/10/2013, 05/01/2014, 03/19/2015   PFIZER(Purple Top)SARS-COV-2 Vaccination 09/23/2019, 10/17/2019   Pneumococcal Conjugate-13 10/06/2013   Pneumococcal Polysaccharide-23 04/28/2012   Td 08/03/2013    Health  Maintenance  Health Maintenance  Topic Date Due   URINE MICROALBUMIN  07/07/2020   HEMOGLOBIN A1C  10/09/2020   FOOT EXAM  11/03/2020   COVID-19 Vaccine (3 - Pfizer risk series) 01/02/2021 (Originally 11/14/2019)   Zoster Vaccines- Shingrix (1 of 2) 03/19/2021 (Originally 07/01/1964)   OPHTHALMOLOGY EXAM  01/12/2021   INFLUENZA VACCINE  01/21/2021   TETANUS/TDAP  08/04/2023   DEXA SCAN  Completed   Hepatitis C Screening  Completed   HPV VACCINES  Aged Out   Mammogram- deferred per patient  Dental Screening: Recommended annual dental exams for proper oral hygiene  Community Resource Referral / Chronic Care Management: CRR required this visit?  No   CCM required  this visit?  No      Plan:   Keep all routine maintenance appointments.   I have personally reviewed and noted the following in the patient's chart:   Medical and social history Use of alcohol, tobacco or illicit drugs  Current medications and supplements including opioid prescriptions.  Functional ability and status Nutritional status Physical activity Advanced directives List of other physicians Hospitalizations, surgeries, and ER visits in previous 12 months Vitals Screenings to include cognitive, depression, and falls Referrals and appointments  In addition, I have reviewed and discussed with patient certain preventive protocols, quality metrics, and best practice recommendations. A written personalized care plan for preventive services as well as general preventive health recommendations were provided to patient via mychart.     Varney Biles, LPN   0/21/1173

## 2020-12-17 NOTE — Patient Instructions (Addendum)
Ms. Scheurich , Thank you for taking time to come for your Medicare Wellness Visit. I appreciate your ongoing commitment to your health goals. Please review the following plan we discussed and let me know if I can assist you in the future.   These are the goals we discussed:  Goals       Patient Stated     Increase physical activity (pt-stated)      Medication Monitoring (pt-stated)      Patient Goals/Self-Care Activities Over the next 90 days, patient will:  - take medications as prescribed check blood glucose periodically, document, and provide at future appointments weight daily, and contact provider if weight gain of > 3 lbs in 1 day, >5 lbs in 1 week         Monitor Home Readings (pt-stated)      Patient Goals/Self-Care Activities Over the next 90 days, patient will:  - take medications as prescribed check blood glucose BID, document, and provide at future appointments weight daily, and contact provider if weight gain of > 3 lbs in 1 day, >5 lbs in 1 week         This is a list of the screening recommended for you and due dates:  Health Maintenance  Topic Date Due   Urine Protein Check  07/07/2020   Hemoglobin A1C  10/09/2020   Complete foot exam   11/03/2020   COVID-19 Vaccine (3 - Pfizer risk series) 01/02/2021*   Zoster (Shingles) Vaccine (1 of 2) 03/19/2021*   Eye exam for diabetics  01/12/2021   Flu Shot  01/21/2021   Tetanus Vaccine  08/04/2023   DEXA scan (bone density measurement)  Completed   Hepatitis C Screening: USPSTF Recommendation to screen - Ages 58-79 yo.  Completed   HPV Vaccine  Aged Out  *Topic was postponed. The date shown is not the original due date.    Advanced directives: End of life planning; Advance aging; Advanced directives discussed.  Copy of current HCPOA/Living Will requested.    Conditions/risks identified: none new  Follow up in one year for your annual wellness visit    Preventive Care 65 Years and Older, Female Preventive  care refers to lifestyle choices and visits with your health care provider that can promote health and wellness. What does preventive care include? A yearly physical exam. This is also called an annual well check. Dental exams once or twice a year. Routine eye exams. Ask your health care provider how often you should have your eyes checked. Personal lifestyle choices, including: Daily care of your teeth and gums. Regular physical activity. Eating a healthy diet. Avoiding tobacco and drug use. Limiting alcohol use. Practicing safe sex. Taking low-dose aspirin every day. Taking vitamin and mineral supplements as recommended by your health care provider. What happens during an annual well check? The services and screenings done by your health care provider during your annual well check will depend on your age, overall health, lifestyle risk factors, and family history of disease. Counseling  Your health care provider may ask you questions about your: Alcohol use. Tobacco use. Drug use. Emotional well-being. Home and relationship well-being. Sexual activity. Eating habits. History of falls. Memory and ability to understand (cognition). Work and work Statistician. Reproductive health. Screening  You may have the following tests or measurements: Height, weight, and BMI. Blood pressure. Lipid and cholesterol levels. These may be checked every 5 years, or more frequently if you are over 67 years old. Skin check. Lung cancer screening.  You may have this screening every year starting at age 26 if you have a 30-pack-year history of smoking and currently smoke or have quit within the past 15 years. Fecal occult blood test (FOBT) of the stool. You may have this test every year starting at age 5. Flexible sigmoidoscopy or colonoscopy. You may have a sigmoidoscopy every 5 years or a colonoscopy every 10 years starting at age 44. Hepatitis C blood test. Hepatitis B blood test. Sexually  transmitted disease (STD) testing. Diabetes screening. This is done by checking your blood sugar (glucose) after you have not eaten for a while (fasting). You may have this done every 1-3 years. Bone density scan. This is done to screen for osteoporosis. You may have this done starting at age 17. Mammogram. This may be done every 1-2 years. Talk to your health care provider about how often you should have regular mammograms. Talk with your health care provider about your test results, treatment options, and if necessary, the need for more tests. Vaccines  Your health care provider may recommend certain vaccines, such as: Influenza vaccine. This is recommended every year. Tetanus, diphtheria, and acellular pertussis (Tdap, Td) vaccine. You may need a Td booster every 10 years. Zoster vaccine. You may need this after age 61. Pneumococcal 13-valent conjugate (PCV13) vaccine. One dose is recommended after age 73. Pneumococcal polysaccharide (PPSV23) vaccine. One dose is recommended after age 65. Talk to your health care provider about which screenings and vaccines you need and how often you need them. This information is not intended to replace advice given to you by your health care provider. Make sure you discuss any questions you have with your health care provider. Document Released: 07/06/2015 Document Revised: 02/27/2016 Document Reviewed: 04/10/2015 Elsevier Interactive Patient Education  2017 [place] Prevention in the Home Falls can cause injuries. They can happen to people of all ages. There are many things you can do to make your home safe and to help prevent falls. What can I do on the outside of my home? Regularly fix the edges of walkways and driveways and fix any cracks. Remove anything that might make you trip as you walk through a door, such as a raised step or threshold. Trim any bushes or trees on the path to your home. Use bright outdoor lighting. Clear any walking  paths of anything that might make someone trip, such as rocks or tools. Regularly check to see if handrails are loose or broken. Make sure that both sides of any steps have handrails. Any raised decks and porches should have guardrails on the edges. Have any leaves, snow, or ice cleared regularly. Use sand or salt on walking paths during winter. Clean up any spills in your garage right away. This includes oil or grease spills. What can I do in the bathroom? Use night lights. Install grab bars by the toilet and in the tub and shower. Do not use towel bars as grab bars. Use non-skid mats or decals in the tub or shower. If you need to sit down in the shower, use a plastic, non-slip stool. Keep the floor dry. Clean up any water that spills on the floor as soon as it happens. Remove soap buildup in the tub or shower regularly. Attach bath mats securely with double-sided non-slip rug tape. Do not have throw rugs and other things on the floor that can make you trip. What can I do in the bedroom? Use night lights. Make sure that you have  a light by your bed that is easy to reach. Do not use any sheets or blankets that are too big for your bed. They should not hang down onto the floor. Have a firm chair that has side arms. You can use this for support while you get dressed. Do not have throw rugs and other things on the floor that can make you trip. What can I do in the kitchen? Clean up any spills right away. Avoid walking on wet floors. Keep items that you use a lot in easy-to-reach places. If you need to reach something above you, use a strong step stool that has a grab bar. Keep electrical cords out of the way. Do not use floor polish or wax that makes floors slippery. If you must use wax, use non-skid floor wax. Do not have throw rugs and other things on the floor that can make you trip. What can I do with my stairs? Do not leave any items on the stairs. Make sure that there are handrails  on both sides of the stairs and use them. Fix handrails that are broken or loose. Make sure that handrails are as long as the stairways. Check any carpeting to make sure that it is firmly attached to the stairs. Fix any carpet that is loose or worn. Avoid having throw rugs at the top or bottom of the stairs. If you do have throw rugs, attach them to the floor with carpet tape. Make sure that you have a light switch at the top of the stairs and the bottom of the stairs. If you do not have them, ask someone to add them for you. What else can I do to help prevent falls? Wear shoes that: Do not have high heels. Have rubber bottoms. Are comfortable and fit you well. Are closed at the toe. Do not wear sandals. If you use a stepladder: Make sure that it is fully opened. Do not climb a closed stepladder. Make sure that both sides of the stepladder are locked into place. Ask someone to hold it for you, if possible. Clearly mark and make sure that you can see: Any grab bars or handrails. First and last steps. Where the edge of each step is. Use tools that help you move around (mobility aids) if they are needed. These include: Canes. Walkers. Scooters. Crutches. Turn on the lights when you go into a dark area. Replace any light bulbs as soon as they burn out. Set up your furniture so you have a clear path. Avoid moving your furniture around. If any of your floors are uneven, fix them. If there are any pets around you, be aware of where they are. Review your medicines with your doctor. Some medicines can make you feel dizzy. This can increase your chance of falling. Ask your doctor what other things that you can do to help prevent falls. This information is not intended to replace advice given to you by your health care provider. Make sure you discuss any questions you have with your health care provider. Document Released: 04/05/2009 Document Revised: 11/15/2015 Document Reviewed:  07/14/2014 Elsevier Interactive Patient Education  2017 [employer].

## 2020-12-18 ENCOUNTER — Encounter: Payer: Self-pay | Admitting: Internal Medicine

## 2020-12-18 ENCOUNTER — Other Ambulatory Visit (HOSPITAL_COMMUNITY): Payer: Self-pay

## 2020-12-19 ENCOUNTER — Telehealth (HOSPITAL_COMMUNITY): Payer: Self-pay | Admitting: Pharmacy Technician

## 2020-12-19 ENCOUNTER — Other Ambulatory Visit (HOSPITAL_COMMUNITY): Payer: Self-pay

## 2020-12-19 NOTE — Telephone Encounter (Signed)
Advanced Heart Failure Patient Advocate Encounter  I received a message from [place] (EMT) that [place] is stating that the patient needs a PA for both Sildenafil and Opsumit. When I attempted to submit the PA's they both came back resulting that the medications are already authorized through 06/22/21.   Sent [name] the response. The specialty pharmacy should have enough refills as well.  Charlann Boxer, CPhT

## 2020-12-19 NOTE — Progress Notes (Signed)
Remote pacemaker transmission.   

## 2020-12-25 ENCOUNTER — Encounter (HOSPITAL_COMMUNITY): Payer: Self-pay

## 2020-12-25 ENCOUNTER — Ambulatory Visit (HOSPITAL_COMMUNITY)
Admission: RE | Admit: 2020-12-25 | Discharge: 2020-12-25 | Disposition: A | Discharge status: Home / Self Care | Payer: Medicare HMO | Source: Ambulatory Visit | Attending: Cardiology | Admitting: Cardiology

## 2020-12-25 ENCOUNTER — Other Ambulatory Visit: Payer: Self-pay

## 2020-12-25 ENCOUNTER — Encounter (HOSPITAL_COMMUNITY): Payer: Self-pay | Admitting: Cardiology

## 2020-12-25 VITALS — BP 144/78 | HR 60 | Wt 198.4 lb

## 2020-12-25 DIAGNOSIS — Z9981 Dependence on supplemental oxygen: Secondary | ICD-10-CM | POA: Diagnosis not present

## 2020-12-25 DIAGNOSIS — N183 Chronic kidney disease, stage 3 unspecified: Secondary | ICD-10-CM | POA: Insufficient documentation

## 2020-12-25 DIAGNOSIS — I2510 Atherosclerotic heart disease of native coronary artery without angina pectoris: Secondary | ICD-10-CM | POA: Insufficient documentation

## 2020-12-25 DIAGNOSIS — R55 Syncope and collapse: Secondary | ICD-10-CM | POA: Insufficient documentation

## 2020-12-25 DIAGNOSIS — Z87891 Personal history of nicotine dependence: Secondary | ICD-10-CM | POA: Insufficient documentation

## 2020-12-25 DIAGNOSIS — Z79899 Other long term (current) drug therapy: Secondary | ICD-10-CM | POA: Insufficient documentation

## 2020-12-25 DIAGNOSIS — I5032 Chronic diastolic (congestive) heart failure: Secondary | ICD-10-CM | POA: Diagnosis not present

## 2020-12-25 DIAGNOSIS — Z950 Presence of cardiac pacemaker: Secondary | ICD-10-CM | POA: Insufficient documentation

## 2020-12-25 DIAGNOSIS — R001 Bradycardia, unspecified: Secondary | ICD-10-CM | POA: Insufficient documentation

## 2020-12-25 DIAGNOSIS — Z7901 Long term (current) use of anticoagulants: Secondary | ICD-10-CM | POA: Insufficient documentation

## 2020-12-25 DIAGNOSIS — E1122 Type 2 diabetes mellitus with diabetic chronic kidney disease: Secondary | ICD-10-CM | POA: Diagnosis not present

## 2020-12-25 DIAGNOSIS — Z86718 Personal history of other venous thrombosis and embolism: Secondary | ICD-10-CM | POA: Diagnosis not present

## 2020-12-25 DIAGNOSIS — I272 Pulmonary hypertension, unspecified: Secondary | ICD-10-CM

## 2020-12-25 DIAGNOSIS — J8410 Pulmonary fibrosis, unspecified: Secondary | ICD-10-CM | POA: Diagnosis not present

## 2020-12-25 DIAGNOSIS — I130 Hypertensive heart and chronic kidney disease with heart failure and stage 1 through stage 4 chronic kidney disease, or unspecified chronic kidney disease: Secondary | ICD-10-CM | POA: Insufficient documentation

## 2020-12-25 DIAGNOSIS — J9611 Chronic respiratory failure with hypoxia: Secondary | ICD-10-CM | POA: Insufficient documentation

## 2020-12-25 DIAGNOSIS — J449 Chronic obstructive pulmonary disease, unspecified: Secondary | ICD-10-CM | POA: Diagnosis not present

## 2020-12-25 DIAGNOSIS — I480 Paroxysmal atrial fibrillation: Secondary | ICD-10-CM | POA: Insufficient documentation

## 2020-12-25 DIAGNOSIS — G4733 Obstructive sleep apnea (adult) (pediatric): Secondary | ICD-10-CM | POA: Insufficient documentation

## 2020-12-25 DIAGNOSIS — Z8249 Family history of ischemic heart disease and other diseases of the circulatory system: Secondary | ICD-10-CM | POA: Insufficient documentation

## 2020-12-25 DIAGNOSIS — I2721 Secondary pulmonary arterial hypertension: Secondary | ICD-10-CM | POA: Diagnosis not present

## 2020-12-25 DIAGNOSIS — I890 Lymphedema, not elsewhere classified: Secondary | ICD-10-CM | POA: Diagnosis not present

## 2020-12-25 LAB — BASIC METABOLIC PANEL
Anion gap: 4 — ABNORMAL LOW (ref 5–15)
BUN: 20 mg/dL (ref 8–23)
CO2: 33 mmol/L — ABNORMAL HIGH (ref 22–32)
Calcium: 8.9 mg/dL (ref 8.9–10.3)
Chloride: 103 mmol/L (ref 98–111)
Creatinine, Ser: 0.99 mg/dL (ref 0.44–1.00)
GFR, Estimated: 59 mL/min — ABNORMAL LOW (ref >60)
Glucose, Bld: 107 mg/dL — ABNORMAL HIGH (ref 70–99)
Potassium: 4 mmol/L (ref 3.5–5.1)
Sodium: 140 mmol/L (ref 135–145)

## 2020-12-25 LAB — DIGOXIN LEVEL: Digoxin Level: 0.5 ng/mL — ABNORMAL LOW (ref 0.8–2.0)

## 2020-12-25 MED ORDER — MECLIZINE HCL 25 MG PO TABS: 25.0000 mg | ORAL_TABLET | Freq: Three times a day (TID) | ORAL | 1 refills | Status: DC | PRN

## 2020-12-25 NOTE — Patient Instructions (Signed)
Good to see you today  No medication changes  Labs done today, your results will be available in MyChart, we will contact you for abnormal readings  Your physician recommends that you schedule a follow-up appointment in: 3 months  If you have any questions or concerns before your next appointment please send [place] a message through [place] or call our office at [phone].    TO LEAVE A MESSAGE FOR THE NURSE SELECT OPTION 2, PLEASE LEAVE A MESSAGE INCLUDING: YOUR NAME DATE OF BIRTH CALL BACK NUMBER REASON FOR CALL**this is important as we prioritize the call backs  YOU WILL RECEIVE A CALL BACK THE SAME DAY AS LONG AS YOU CALL BEFORE 4:00 PM  At the [facility], you and your health needs are our priority. As part of our continuing mission to provide you with exceptional heart care, we have created designated Provider Care Teams. These Care Teams include your primary Cardiologist (physician) and Advanced Practice Providers (APPs- Physician Assistants and Nurse Practitioners) who all work together to provide you with the care you need, when you need it.   You may see any of the following providers on your designated Care Team at your next follow up: Dr Glori Bickers Dr [name] Dr Patrice Paradise, NP Lyda Jester, [place] Ginnie Smart Audry Riles, PharmD   Please be sure to bring in all your medications bottles to every appointment.   Do the following things EVERYDAY: Weigh yourself in the morning before breakfast. Write it down and keep it in a log. Take your medicines as prescribed Eat low salt foods--Limit salt (sodium) to 2000 mg per day.  Stay as active as you can everyday Limit all fluids for the day to less than 2 liters

## 2020-12-25 NOTE — Progress Notes (Signed)
Have spoken to [place] concerning medication refills for [place] for her sildinafil, opsumit and uptravi.  He pharmacy is advising that the foundation is not releasing the funds to refill her meds.  She spent $40.00 for 7 pilss for the uptravi and she has only a couple of days of the others left.  She has appt today and Nira Conn advised she hopes to have an answer for her today.  Will check with [place] to see if they figured it out.   [place] [phone]

## 2020-12-25 NOTE — Progress Notes (Signed)
Date:  12/25/2020   ID:  Debra Saunders, DOB 22-Nov-1945, MRN [id number]   Provider location: [facility] Type of Visit: Established patient   PCP:  [name], MD  Cardiologist:  [name], MD Primary HF: Dr. [name]  Chief Complaint: Shortness of breath   History of Present Illness: Debra Saunders is a 75 y.o. 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   Admitted 10/24-10/29/19 with acute on chronic diastolic HF. HF team consulted with concerns for low output. PICC was placed and milrinone to support RV function was started along with IV Lasix to facilitate diuresis. RHC done to evaluate PAH (see below). V/Q scan negative. High res CT showed pulmonary fibrosis. Pulmonary consult and recommended stopping amiodarone. She was transitioned to torsemide 60 mg BID. DC weight: 226 lbs.    She has started on selexipag for pulmonary hypertension in addition to Opsumit and Revatio.      Echo in 3/21 showed EF 60-65%, normal RV, PASP 50 mmHg, moderate-severe MR.   Had [place] 08/30/2020- low/normal filling pressures, mild pumonary htn, and preserved cardiac. output.   Today she returns for HF follow up.Overall feeling fine. Denies SOB/PND/Orthopnea. Appetite ok. No fever or chills. Weight at home  pounds. Taking all medications.  6 minute walk (1/20): 98 m 6 minute walk (7/20): 213 m 6 minute walk (3/22): 198 m   Labs (11/19): digoxin 0.8 Labs (1/20): K 4.2, creatinine 1.36 => 1.33, digoxin 0.8 Labs (2/20): digoxin 0.2, K 4.3, creatinine 1.32 Labs (5/20): K 4.5, creatinine 1.4, digoxin 0.8 Labs (1/22): K 4, creatinine 1.07   PMH: 1.  Lymphedema: Left lower leg (chronic).  2. COPD 3. 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  - PFTs (2017) with FEV1 64%, FEV1/FVC ratio 106%, TLC 49%, DLCO 39% (restrictive).  - CT chest high resolution (10/19): Pulmonary parenchymal pattern of fibrosis.  - CT chest (4/22): Chronic ILD 4. Atrial fibrillation: Paroxysmal.  She is off amiodarone due to lung disease.  - 2/19 DCCV.  5. H/o DVT s/p IVC filter in 2004.  6. CAD: 4/19 coronary angiography with minimal coronary disease.  7. Bradycardia: [employer] PPM.  8. H/o traumatic SAH 2014.  9. CKD: Stage 3.  10. H/o CVA 11. OSA: CPAP 12. HTN 13. Type II diabetes 14. Pulmonary hypertension:  - PFTs 2017 with severe restriction.  - Echo (10/19): EF 60-65%, PASP 52 mmHg - RHC (4/19): Severe PAH with PVR 7.2 WU.  - RHC (10/19): mean RA 6, PA 56/17 mean 33, PCWP mean 12, CI 3.23, PVR 3.4 WU.  - RHC (3/22): mean 3, RV 30/1, PA 33/11, mean 20, PCWP mean 6, Oxygen saturations: PA 69% AO 100%, Cardiac Output (Fick) 6.18, Cardiac Index (Fick) 3.36, and PVR 2.3 WU - V/Q scan (10/19): No chronic PE.  - Anti-SCL 70 and ANA negative.  ACE normal.  RF slightly elevated at 15.  - Echo (3/21): EF 60-65%, normal RV, PASP 50 mmHg, moderate-severe MR. FH: mother with CHF 15. 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  16. Mitral regurgitation: moderate to severe on 3/22 echo.  17. Zio patch (3/22): 9.3% PACs, no AF, 2.9% PVCs  Current Outpatient Medications  Medication Sig Dispense Refill   acetaminophen (TYLENOL) 325 MG tablet Take 325-650 mg by mouth every 6 (six) hours as needed for mild pain. Reported on 10/10/2015     albuterol (ACCUNEB) 0.63 MG/3ML nebulizer solution Take 1 ampule by nebulization every 6 (six) hours as needed for wheezing.     apixaban (ELIQUIS) 5 MG TABS tablet Take 1 tablet (5 mg total) by mouth 2 (two) times daily. Needs appt for future refills 60 tablet 6   cholestyramine (QUESTRAN) 4  GM/DOSE powder Take 4 g by mouth daily.     digoxin (LANOXIN) 0.125 MG tablet Take 1 tablet (125 mcg total) by mouth daily. 90 tablet 3   gabapentin (NEURONTIN) 100 MG capsule Take 2 capsules (200 mg total) by mouth 3 (three) times daily. 540 capsule 1   KLOR-CON M20 20 MEQ tablet Take 1 tablet by mouth once daily 90 tablet 3   Lancets MISC Use with True Metrix glucometer to check sugars BID 200 each 3   macitentan (OPSUMIT) 10 MG tablet Take 1 tablet (10 mg total) by mouth daily. 30 tablet 11   meclizine (ANTIVERT) 25 MG tablet Take 1 tablet (25 mg total) by mouth 3 (three) times daily as needed for dizziness. 30 tablet 0   Menthol, Topical Analgesic, (BIOFREEZE EX) Apply 1 application topically 4 (four) times daily as needed (for pain).     ondansetron (ZOFRAN) 8 MG tablet Take 1 tablet (8 mg total) by mouth every 8 (eight) hours as needed for nausea or vomiting. 30 tablet 2   OXYGEN Inhale 4 L into the lungs continuous.     rosuvastatin (CRESTOR) 10 MG tablet Take 1 tablet (10 mg total) by mouth daily. 90 tablet 3   Selexipag (UPTRAVI) 1600 MCG TABS Take 1,600 mcg by mouth 2 (two) times daily.     sildenafil (REVATIO) 20 MG tablet Take 1 tablet (20 mg total) by mouth 3 (three) times daily. 270 tablet 3   spironolactone (ALDACTONE) 25 MG tablet Take 1 tablet (25 mg total) by mouth at bedtime. Please call for a office visit [phone] 90 tablet 2   torsemide (DEMADEX) 20 MG tablet Take 3 tablets (60 mg total) by mouth 2 (two) times daily. 180 tablet 3   TRUE METRIX BLOOD GLUCOSE TEST test strip CHECK BLOOD GLUCOSE TWICE DAILY 200 strip 3   No current facility-administered medications for this encounter.    Allergies:   Aspirin, Other, and Penicillins   Social History:  The patient  reports that she quit smoking about 30 years ago. Her smoking use included cigarettes. She has a 0.60 pack-year smoking history. She has never used smokeless tobacco. She reports that she does not drink alcohol  and does not use drugs.   Family History:  The patient's family history includes Bone cancer in her sister; Breast cancer in her maternal aunt and sister; COPD in her mother; Heart disease in her mother; Hypertension in her father, mother, sister, and sister; Thyroid disease in her mother, sister, and sister.   ROS:  Please see the history of present illness.   All other systems are personally reviewed and negative.   Exam:   General: NAD Neck: JVP 8-9 cm, no thyromegaly or thyroid nodule.  Lungs: Clear to auscultation bilaterally with normal respiratory effort. CV: Nondisplaced PMI.  Heart regular S1/S2, no S3/S4, 1/6 HSM apex.  No carotid bruit.  Normal pedal pulses.  Abdomen: Soft, nontender, no hepatosplenomegaly, no distention.  Skin: Intact  without lesions or rashes.  Neurologic: Alert and oriented x 3.  Psych: Normal affect. Extremities: No clubbing or cyanosis. Left leg lymphedema.  HEENT: Normal.   Recent Labs: 04/10/2020: TSH 0.98 08/24/2020: B Natriuretic Peptide 51.2 10/24/2020: ALT 17; BUN 27; Creatinine, Ser 0.95; Hemoglobin 9.6; Platelets 191; Potassium 4.4; Sodium 140  Personally reviewed   Wt Readings from Last 3 Encounters:  12/25/20 90 kg (198 lb 6.4 oz)  12/17/20 89.4 kg (197 lb)  11/28/20 89.4 kg (197 lb)    ASSESSMENT AND PLAN:  1. 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  Echo 10/19 showed EF 60-65% with moderate LVH, RV was difficult to see.  Echo in 3/21 with EF 60-65%, normal RV, PASP 50 mmHg, moderate-severe MR. During admission in 10/19, she required milrinone to support RV.  NYHA class III symptoms, worse x 2 months. Pulmonary fibrosis likely plays a role in her dyspnea.     - NYHA III - Volume status  torsemide to 60 mg bid  - ECHO     2. Pulmonary hypertension: She was followed in the past at [facility].  Based on 4/19 cath, she had severe PAH with PVR 7.2 WU.  She has been on sildenafil 20 mg tid x  4 years. Chronic hypoxemic respiratory failure, on home oxygen.  She has a history of remote DVT (2004) but V/Q scan negative for evidence of chronic PE.  High resolution CT chest in 10/19 showed pulmonary fibrosis, and last PFTs in 2017 showed severe restriction.  Serological workup for rheumatological diseases was unremarkable.  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 Amiodarone was stopped given lung parenchymal disease. 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 Repeat RHC in 10/19 showed moderate pulmonary hypertension, PVR 3.4 WU which is significantly lower than on the 4/19 cath. RHC in 3/22 with normal PA pressure and PVR 2.3 WU.  - Continue sildenafil and Opsumit.   - Continue selexipag 1600 mcg bid.  - Continue digoxin for RV support, check digoxin level today.  - Continue CPAP and home oxygen.  - Continue followup with pulmonary for ILD, possible chronic hypersensitivity pneumonitis.  3. CKD stage 3: BMET today.  4. Atrial fibrillation: Paroxysmal.  Off amiodarone with parenchymal lung disease. No palpitations.  She is a-paced today.  - Continue apixaban 5 mg bid.  5. OSA: Continue CPAP.  6. HTN: BP controlled.  7. 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     8. Presyncope: She has had several episodes, no prodrome.  - Zio Patch - No cause for syncope/presyncope. Jeanmarie Hubert, NP  12/25/2020  [place] [address] [phone] (office) [phone] (fax)  Patient seen with NP, agree with the above note.   She is stable today.  Remains on 4L O2 by [place].  Occasional "dizziness," seems more positional with head movement.  RHC in 3/22 showed normal filling pressures and normal PA pressure.  She is now seeing pulmonary at [facility] clinic.    General: NAD Neck: No JVD, no thyromegaly or thyroid nodule.  Lungs: Mild dry crackles at bases.  CV: Nondisplaced PMI.  Heart regular S1/S2, no S3/S4, no murmur.  Chronic left leg lymphedema.  No carotid bruit.  Normal pedal pulses.  Abdomen: Soft, nontender, no hepatosplenomegaly, no distention.  Skin: Intact without lesions or rashes.  Neurologic: Alert and oriented x 3.  Psych: Normal affect. Extremities: No clubbing or cyanosis.  HEENT: Normal.   She looks euvolemic, continue current torsemide.  Continue her pulmonary hypertension meds (last RHC showed normal PA pressure and PVR).  Followup with pulmonary for workup of ILD.    BMET, digoxin level, CBC today. Followup in 3 months.   [name] 12/26/2020

## 2021-01-01 ENCOUNTER — Encounter (HOSPITAL_COMMUNITY): Payer: Self-pay

## 2021-01-03 ENCOUNTER — Other Ambulatory Visit (HOSPITAL_COMMUNITY): Payer: Self-pay

## 2021-01-03 ENCOUNTER — Telehealth (HOSPITAL_COMMUNITY): Payer: Self-pay | Admitting: *Deleted

## 2021-01-03 MED ORDER — SILDENAFIL CITRATE 20 MG PO TABS: 20.0000 mg | ORAL_TABLET | Freq: Three times a day (TID) | ORAL | 3 refills | Status: DC

## 2021-01-03 NOTE — Telephone Encounter (Signed)
Per pharm tech Sildenafil cost is $3.95 for 90 day supply, rx sent to [facility], pt needs to call [employer] at [phone] to discuss Opsumit pt assist.  I have advised [name] of all above and she will relay info to pt

## 2021-01-10 ENCOUNTER — Other Ambulatory Visit (HOSPITAL_COMMUNITY): Payer: Self-pay

## 2021-01-10 ENCOUNTER — Encounter (HOSPITAL_COMMUNITY): Payer: Self-pay

## 2021-01-10 NOTE — Progress Notes (Signed)
Today had a home visit with Debra Saunders. The only complaint she has today and she can not go to sleep earlier at night, she has been going to sleep around 5 am and sleeps til noon or little later.  She denies chest pain, headache, and increased shortness of breath.   She has some dizziness but she states she has been getting it for months and Dr [name] knows about.  They did lots of test.  She is wearing her oxygen and ambulates with walker.  She tries to watch her diet.  She is taking all her medications except she has been out of opsumit for 1 week.  She has contacted Wynetta Emery and [place] for help and they advised will be mailing her paperwork. Have spoken to pharmacy and Nira Conn at [facility] about being out.  Filled her med boxes for her.  She will call for refill on uptravi and eliquis in about 1 week.  She has 2 med boxes filled.  She is aware of up coming appts.  She is complaining of her big toe hurting but has no swelling, no scratches and has not hit it.  Advised to contact her PCP, she states she will.  She is concerned if could be gout.  Not hot to touch or red.  Will continue to visit for heart failure, diet and medication management.   [place] [phone]

## 2021-01-14 ENCOUNTER — Telehealth: Payer: Self-pay | Admitting: Family Medicine

## 2021-01-14 NOTE — Telephone Encounter (Signed)
Patient informed, Due to the high volume of calls and your symptoms we have to forward your call to our Triage Nurse to expedient your call. Please hold for the transfer.  Patient transferred to Access Nurse. Due to  Chest pains x2 days and leg swelling.

## 2021-01-14 NOTE — Telephone Encounter (Signed)
Chest pain for two days , says it feels like in the past more to the right side of chest , like costochondritis she has had in the past,patient says she has no one to drive her, says that it does not radiate to the arm , neck or back just to right side of chest , no shortness of breathe , advised patient to keep appointment tomorrow but advised she really needs to be seen to night patient refuses ER or UC at this time.

## 2021-01-14 NOTE — Telephone Encounter (Signed)
Noted. Agree with advice to be seen right away. Please follow-up with the patient if she does not keep her appointment with Dr McLean-Scocuzza.

## 2021-01-15 ENCOUNTER — Encounter: Payer: Self-pay | Admitting: Internal Medicine

## 2021-01-15 ENCOUNTER — Ambulatory Visit (INDEPENDENT_AMBULATORY_CARE_PROVIDER_SITE_OTHER): Payer: Medicare HMO | Admitting: Internal Medicine

## 2021-01-15 ENCOUNTER — Ambulatory Visit (INDEPENDENT_AMBULATORY_CARE_PROVIDER_SITE_OTHER): Payer: Medicare HMO

## 2021-01-15 ENCOUNTER — Other Ambulatory Visit: Payer: Self-pay

## 2021-01-15 VITALS — BP 140/70 | HR 61 | Temp 97.2°F | Ht 60.0 in | Wt 196.8 lb

## 2021-01-15 DIAGNOSIS — R079 Chest pain, unspecified: Secondary | ICD-10-CM | POA: Diagnosis not present

## 2021-01-15 DIAGNOSIS — N644 Mastodynia: Secondary | ICD-10-CM | POA: Diagnosis not present

## 2021-01-15 DIAGNOSIS — I517 Cardiomegaly: Secondary | ICD-10-CM | POA: Diagnosis not present

## 2021-01-15 DIAGNOSIS — R7989 Other specified abnormal findings of blood chemistry: Secondary | ICD-10-CM

## 2021-01-15 DIAGNOSIS — M940 Chondrocostal junction syndrome [Tietze]: Secondary | ICD-10-CM

## 2021-01-15 DIAGNOSIS — R0789 Other chest pain: Secondary | ICD-10-CM

## 2021-01-15 DIAGNOSIS — M7989 Other specified soft tissue disorders: Secondary | ICD-10-CM

## 2021-01-15 DIAGNOSIS — D649 Anemia, unspecified: Secondary | ICD-10-CM | POA: Diagnosis not present

## 2021-01-15 LAB — CBC WITH DIFFERENTIAL/PLATELET
Basophils Absolute: 0 10*3/uL (ref 0.0–0.1)
Basophils Relative: 0.7 % (ref 0.0–3.0)
Eosinophils Absolute: 0.2 10*3/uL (ref 0.0–0.7)
Eosinophils Relative: 3.2 % (ref 0.0–5.0)
HCT: 32.8 % — ABNORMAL LOW (ref 36.0–46.0)
Hemoglobin: 10.4 g/dL — ABNORMAL LOW (ref 12.0–15.0)
Lymphocytes Relative: 23.9 % (ref 12.0–46.0)
Lymphs Abs: 1.3 10*3/uL (ref 0.7–4.0)
MCHC: 31.6 g/dL (ref 30.0–36.0)
MCV: 87.3 fl (ref 78.0–100.0)
Monocytes Absolute: 0.3 10*3/uL (ref 0.1–1.0)
Monocytes Relative: 6.5 % (ref 3.0–12.0)
Neutro Abs: 3.5 10*3/uL (ref 1.4–7.7)
Neutrophils Relative %: 65.7 % (ref 43.0–77.0)
Platelets: 180 10*3/uL (ref 150.0–400.0)
RBC: 3.76 Mil/uL — ABNORMAL LOW (ref 3.87–5.11)
RDW: 12.5 % (ref 11.5–15.5)
WBC: 5.3 10*3/uL (ref 4.0–10.5)

## 2021-01-15 LAB — TROPONIN I (HIGH SENSITIVITY): High Sens Troponin I: 13 ng/L (ref 2–17)

## 2021-01-15 NOTE — Progress Notes (Signed)
Patient presenting with on and off chest pain for the last 2 weeks. States it feels like an aching pain.   Patient states she is having pain in the left foot, so painful she has to walk with a limp. Ongoing for 2 weeks with swelling.   No falls or known injuries.

## 2021-01-15 NOTE — Telephone Encounter (Signed)
Providing access nurse documentation.      

## 2021-01-15 NOTE — Telephone Encounter (Signed)
Patient arrived for appointment.

## 2021-01-15 NOTE — Patient Instructions (Signed)
Voltaren gel or aspercream with lidocaine  Tylenol as needed   Costochondritis  Costochondritis is inflammation of the tissue (cartilage) that connects the ribs to the breastbone (sternum). This causes pain in the front of the chest. The pain usually starts slowlyand involves more than one rib. What are the causes? The exact cause of this condition is not always known. It results from stress on the cartilage where your ribs attach to your sternum. The cause of this stress could be: Chest injury. Exercise or activity, such as lifting. Severe coughing. What increases the risk? You are more likely to develop this condition if you: Are female. Are 37-62 years old. Recently started a new exercise or work activity. Have low levels of vitamin D. Have a condition that makes you cough frequently. What are the signs or symptoms? The main symptom of this condition is chest pain. The pain: Usually starts gradually and can be sharp or dull. Gets worse with deep breathing, coughing, or exercise. Gets better with rest. May be worse when you press on the affected area of your ribs and sternum. How is this diagnosed? This condition is diagnosed based on your symptoms, your medical history, and a physical exam. Your health care provider will check for pain when pressing on your sternum. You may also have tests to rule out other causes of chest pain. These may include: A chest X-ray to check for lung problems. An ECG (electrocardiogram) to see if you have a heart problem that could be causing the pain. An imaging scan to rule out a chest or rib fracture. How is this treated? This condition usually goes away on its own over time. Your health care provider may prescribe an NSAID, such as ibuprofen, to reduce pain and inflammation. Treatment may also include: Resting and avoiding activities that make pain worse. Applying heat or ice to the area to reduce pain and inflammation. Doing exercises to stretch  your chest muscles. If these treatments do not help, your health care provider may inject a numbingmedicine at the sternum-rib connection to help relieve the pain. Follow these instructions at home: Managing pain, stiffness, and swelling     If directed, put ice on the painful area. To do this: Put ice in a plastic bag. Place a towel between your skin and the bag. Leave the ice on for 20 minutes, 2-3 times a day. If directed, apply heat to the affected area as often as told by your health care provider. Use the heat source that your health care provider recommends, such as a moist heat pack or a heating pad. Place a towel between your skin and the heat source. Leave the heat on for 20-30 minutes. Remove the heat if your skin turns bright red. This is especially important if you are unable to feel pain, heat, or cold. You may have a greater risk of getting burned. Activity Rest as told by your health care provider. Avoid activities that make pain worse. This includes any activities that use chest, abdominal, and side muscles. Do not lift anything that is heavier than 10 lb (4.5 kg), or the limit that you are told, until your health care provider says that it is safe. Return to your normal activities as told by your health care provider. Ask your health care provider what activities are safe for you. General instructions Take over-the-counter and prescription medicines only as told by your health care provider. Keep all follow-up visits as told by your health care provider. This is  important. Contact a health care provider if: You have chills or a fever. Your pain does not go away or it gets worse. You have a cough that does not go away. Get help right away if: You have shortness of breath. You have severe chest pain that is not relieved by medicines, heat, or ice. These symptoms may represent a serious problem that is an emergency. Do not wait to see if the symptoms will go away. Get  medical help right away. Call your local emergency services (911 in the [place] Do not drive yourself to the hospital. Summary Costochondritis is inflammation of the tissue (cartilage) that connects the ribs to the breastbone (sternum). This condition causes pain in the front of the chest. Costochondritis results from stress on the cartilage where your ribs attach to your sternum. Treatment may include medicines, rest, heat or ice, and exercises. This information is not intended to replace advice given to you by your health care provider. Make sure you discuss any questions you have with your healthcare provider. Document Revised: 04/22/2019 Document Reviewed: 04/22/2019 [employer]vier Patient Education  2022 [employer].

## 2021-01-16 ENCOUNTER — Encounter: Payer: Self-pay | Admitting: Internal Medicine

## 2021-01-16 LAB — IRON,TIBC AND FERRITIN PANEL
%SAT: 23 % (calc) (ref 16–45)
Ferritin: 365 ng/mL — ABNORMAL HIGH (ref 16–288)
Iron: 78 ug/dL (ref 45–160)
TIBC: 336 mcg/dL (calc) (ref 250–450)

## 2021-01-16 LAB — D-DIMER, QUANTITATIVE: D-Dimer, Quant: 0.52 mcg/mL FEU — ABNORMAL HIGH (ref <0.50)

## 2021-01-16 NOTE — Progress Notes (Signed)
Chief Complaint  Patient presents with   Chest Pain   F/u  1. C/o chest pain on and off x 2 weeks h/o costochondritis called the clinic and advised to go to ED but declined CP is mid to right sided. No heavy lifting worse with pressing no radiation of sxs feels sore nothing worse nothing better  Cxr with stable scarring today Pulm htn on 4L o2 cont not changed stable chronic sob out of macitentan x 2 weeks sent Dr. [name] a message f/u [facility] and local [facility] clinic Dr. Fletcher Anon  2. C/o left foot pain and swelling 3. Due for mammogram   Review of Systems  Constitutional:  Negative for weight loss.  HENT:  Negative for hearing loss.   Eyes:  Negative for blurred vision.  Respiratory:  Positive for shortness of breath.   Cardiovascular:  Positive for chest pain and leg swelling.  Skin:  Negative for rash.  Past Medical History:  Diagnosis Date   Arthritis    Atrial fibrillation, persistent ([place])    a. s/p TEE-guided DCCV on 08/20/2017   Cardiorenal syndrome    a. 09/2017 Creat rose to 2.7 w/ diuresis-->HD x 2.   Chest pain    a. 01/2012 Cath: nonobs dzs;  c. 04/2014 MV: No ischemia.   Chronic back pain    Chronic combined systolic (congestive) and diastolic (congestive) heart failure ([place])    a. Dx 2005 @ [facility];  b. 02/2014 Echo: EF 55-60%; c. 07/2017: EF 30-35%, diffuse HK, trivial AI, severe MR, moderate TR; d. 09/2017 Echo: EF 40-45%; e. 02/2018 Echo: EF 55-60%, Gr1 DD; f. 08/2019 Echo: EF 60-65%, Gr2 DD, Nl RV fxn, RVSP 49.55mmHg. Mod-Sev MR. Mild to mod TR. Mod PR. RA ~ 59mmHg.   Chronic respiratory failure (HCC)    a. on home O2   COPD (chronic obstructive pulmonary disease) ([place])    a. Home O2 - 3lpm   Gastritis    History of intracranial hemorrhage    a. 6/14 described by neurosurgery as "small frontal posttraumatic SAH " - pt was on xarelto @ the time.   Hyperlipidemia    Hypertension    Interstitial lung disease (HCC)    Lipoma of back    Lymphedema    a.  Chronic LLE edema.   NICM (nonischemic cardiomyopathy) ([place])    a. 09/2017 Echo: EF 40-45%; b. 09/2017 Cath: minor, insignificant CAD; c. 02/2018 Echo: EF 55-60%, Gr1 DD; d. 08/2019 Echo: EF 60-65%, Gr2 DD.   Obesity    PAH (pulmonary artery hypertension) ([place])    a. 09/2017 Echo: PASP 74mmHg; b. 09/2017 RHC: RA 23, RV 84/13, PA 84/29, PCWP 20, CO 3.89, CI 1.89. LVEDP 18.   Presence of permanent cardiac pacemaker 01/09/2014   Sepsis with metabolic encephalopathy ([place])    Sleep apnea    Streptococcal bacteremia    Stroke [facility]) 2014   Subarachnoid hemorrhage ([place]) 01/19/2013   Symptomatic bradycardia    a. s/p BSX PPM.   Thyroid nodule    Type II diabetes mellitus ([place])    Urinary incontinence    Past Surgical History:  Procedure Laterality Date   ABDOMINAL HYSTERECTOMY  1969   BREAST BIOPSY Right 10/28/2019   lymph node bx, [place] Bx, pending path    CARDIAC CATHETERIZATION  2013   @ [place]: No obstructive CAD: Only 20% ostial left circumflex.   CARDIOVERSION N/A 10/12/2017   Procedure: CARDIOVERSION;  Surgeon: Minna Merritts, MD;  Location: [facility] ORS;  Service: Cardiovascular;  Laterality: N/A;   CATARACT EXTRACTION W/PHACO Right 09/19/2020   Procedure: CATARACT EXTRACTION PHACO AND INTRAOCULAR LENS PLACEMENT (IOC) RIGHT DIABETIC 3.95 00:43.0 9.2%;  Surgeon: Leandrew Koyanagi, MD;  Location: [place];  Service: Ophthalmology;  Laterality: Right;  Pt requests to be last sleep apnea   CATARACT EXTRACTION W/PHACO Left 10/03/2020   Procedure: CATARACT EXTRACTION PHACO AND INTRAOCULAR LENS PLACEMENT ([place]) LEFT DIABETIC;  Surgeon: Leandrew Koyanagi, MD;  Location: [place];  Service: Ophthalmology;  Laterality: Left;  3.25 0:59.3 5.5%   CHOLECYSTECTOMY     COLONOSCOPY WITH PROPOFOL N/A 09/10/2018   Procedure: COLONOSCOPY WITH PROPOFOL;  Surgeon: Manya Silvas, MD;  [facility];  Service: Endoscopy;  Laterality: N/A;   CYST REMOVAL TRUNK  2002   BACK    DIALYSIS/PERMA CATHETER INSERTION N/A 10/06/2017   Procedure: DIALYSIS/PERMA CATHETER INSERTION;  Surgeon: Katha Cabal, MD;  Location: [place] CV LAB;  Service: Cardiovascular;  Laterality: N/A;   INSERT / REPLACE / REMOVE PACEMAKER     lipoma removal  2014   back   PACEMAKER INSERTION  2015   [employer] dual chamber pacemaker implanted by Dr Caryl Comes for symptomatic bradycardia   PERMANENT PACEMAKER INSERTION N/A 01/09/2014   Procedure: PERMANENT PACEMAKER INSERTION;  Surgeon: Deboraha Sprang, MD;  Location: [facility] CATH LAB;  Service: Cardiovascular;  Laterality: N/A;   RIGHT HEART CATH N/A 04/19/2018   Procedure: RIGHT HEART CATH;  Surgeon: Larey Dresser, MD;  Location: [place] CV LAB;  Service: Cardiovascular;  Laterality: N/A;   RIGHT HEART CATH N/A 08/30/2020   Procedure: RIGHT HEART CATH;  Surgeon: Larey Dresser, MD;  Location: [place] CV LAB;  Service: Cardiovascular;  Laterality: N/A;   RIGHT/LEFT HEART CATH AND CORONARY ANGIOGRAPHY N/A 10/19/2017   Procedure: RIGHT/LEFT HEART CATH AND CORONARY ANGIOGRAPHY;  Surgeon: Wellington Hampshire, MD;  Location: [place] CV LAB;  Service: Cardiovascular;  Laterality: N/A;   TEE WITHOUT CARDIOVERSION N/A 08/20/2017   Procedure: TRANSESOPHAGEAL ECHOCARDIOGRAM (TEE) & Direct current cardioversion;  Surgeon: Minna Merritts, MD;  Location: [facility] ORS;  Service: Cardiovascular;  Laterality: N/A;   TRIGGER FINGER RELEASE     Family History  Problem Relation Age of Onset   Heart disease Mother    Hypertension Mother    COPD Mother        was a smoker   Thyroid disease Mother    Hypertension Father    Hypertension Sister    Thyroid disease Sister    Breast cancer Sister    Hypertension Sister    Thyroid disease Sister    Bone cancer Sister    Breast cancer Maternal Aunt    Kidney disease Neg Hx    Bladder Cancer Neg Hx    Social History   Socioeconomic History   Marital status: Divorced    Spouse name: Not on  file   Number of children: 4   Years of education: 12   Highest education level: High school graduate  Occupational History   Occupation: Retired- factory work    Comment: exposed to Pharmacist, community  Tobacco Use   Smoking status: Former    Packs/day: 0.30    Years: 2.00    Pack years: 0.60    Types: Cigarettes    Quit date: 06/23/1990    Years since quitting: 30.5   Smokeless tobacco: Never  Vaping Use   Vaping Use: Never used  Substance and Sexual Activity   Alcohol  use: No   Drug use: No   Sexual activity: Not Currently  Other Topics Concern   Not on file  Social History Narrative   Lives in [place] with daughter. Has 4 children. No pets.      Work - Restaurant manager, fast food, retired      Diet - regular      02/14/2019: reports has Meals On Wheels delivered to her home;       Remote hx of smoking > 30 years; No alcohol-    Social Determinants of Health   Financial Resource Strain: Low Risk    Difficulty of Paying Living Expenses: Not very hard  Food Insecurity: No Food Insecurity   Worried About Charity fundraiser in the Last Year: Never true   Ran Out of Food in the Last Year: Never true  Transportation Needs: No Transportation Needs   Lack of Transportation (Medical): No   Lack of Transportation (Non-Medical): No  Physical Activity: Not on file  Stress: No Stress Concern Present   Feeling of Stress : Only a little  Social Connections: Socially Isolated   Frequency of Communication with Friends and Family: More than three times a week   Frequency of Social Gatherings with Friends and Family: Once a week   Attends Religious Services: Never   Marine scientist or Organizations: No   Attends Music therapist: Never   Marital Status: Divorced  Human resources officer Violence: Not At Risk   Fear of Current or Ex-Partner: No   Emotionally Abused: No   Physically Abused: No   Sexually Abused: No   Current Meds  Medication Sig   acetaminophen (TYLENOL) 325 MG  tablet Take 325-650 mg by mouth every 6 (six) hours as needed for mild pain. Reported on 10/10/2015   albuterol (ACCUNEB) 0.63 MG/3ML nebulizer solution Take 1 ampule by nebulization every 6 (six) hours as needed for wheezing.   apixaban (ELIQUIS) 5 MG TABS tablet Take 1 tablet (5 mg total) by mouth 2 (two) times daily. Needs appt for future refills   cholestyramine (QUESTRAN) 4 GM/DOSE powder Take 4 g by mouth daily.   digoxin (LANOXIN) 0.125 MG tablet Take 1 tablet (125 mcg total) by mouth daily.   gabapentin (NEURONTIN) 100 MG capsule Take 2 capsules (200 mg total) by mouth 3 (three) times daily.   KLOR-CON M20 20 MEQ tablet Take 1 tablet by mouth once daily   Lancets MISC Use with True Metrix glucometer to check sugars BID   meclizine (ANTIVERT) 25 MG tablet Take 1 tablet (25 mg total) by mouth 3 (three) times daily as needed for dizziness.   Menthol, Topical Analgesic, (BIOFREEZE EX) Apply 1 application topically 4 (four) times daily as needed (for pain).   ondansetron (ZOFRAN) 8 MG tablet Take 1 tablet (8 mg total) by mouth every 8 (eight) hours as needed for nausea or vomiting.   OXYGEN Inhale 4 L into the lungs continuous.   rosuvastatin (CRESTOR) 10 MG tablet Take 1 tablet (10 mg total) by mouth daily.   Selexipag (UPTRAVI) 1600 MCG TABS Take 1,600 mcg by mouth 2 (two) times daily.   sildenafil (REVATIO) 20 MG tablet Take 1 tablet (20 mg total) by mouth 3 (three) times daily.   spironolactone (ALDACTONE) 25 MG tablet Take 1 tablet (25 mg total) by mouth at bedtime. Please call for a office visit [phone]   torsemide (DEMADEX) 20 MG tablet Take 3 tablets (60 mg total) by mouth 2 (two) times daily.  TRUE METRIX BLOOD GLUCOSE TEST test strip CHECK BLOOD GLUCOSE TWICE DAILY   Allergies  Allergen Reactions   Aspirin Hives and Other (See Comments)    Pt states that she is unable to take because she had bleeding in her brain   Other Other (See Comments)    Pt states that she is unable  to take blood thinners because she had bleeding in her brain; Blood Thinners-per doctor at [facility] (Xarelto)   Penicillins Hives, Shortness Of Breath, Swelling and Other (See Comments)    Has patient had a PCN reaction causing immediate rash, facial/tongue/throat swelling, SOB or lightheadedness with hypotension: Yes Has patient had a PCN reaction causing severe rash involving mucus membranes or skin necrosis: No Has patient had a PCN reaction that required hospitalization No Has patient had a PCN reaction occurring within the last 10 years: No If all of the above answers are "NO", then may proceed with Cephalosporin use.   Recent Results (from the past 2160 hour(s))  Iron and TIBC     Status: None   Collection Time: 10/24/20  2:55 PM  Result Value Ref Range   Iron 60 28 - 170 ug/dL   TIBC 388 250 - 450 ug/dL   Saturation Ratios 16 10.4 - 31.8 %   UIBC 328 ug/dL    Comment: Performed at [facility], [address] [place], [place] [address]  Ferritin     Status: None   Collection Time: 10/24/20  2:55 PM  Result Value Ref Range   Ferritin 215 11 - 307 ng/mL    Comment: Performed at [facility], [address] [place], [place] [address]  Basic metabolic panel     Status: Abnormal   Collection Time: 10/24/20  2:55 PM  Result Value Ref Range   Sodium 140 135 - 145 mmol/L   Potassium 4.4 3.5 - 5.1 mmol/L   Chloride 102 98 - 111 mmol/L   CO2 30 22 - 32 mmol/L   Glucose, Bld 104 (H) 70 - 99 mg/dL    Comment: Glucose reference range applies only to samples taken after fasting for at least 8 hours.   BUN 27 (H) 8 - 23 mg/dL   Creatinine, Ser 0.95 0.44 - 1.00 mg/dL   Calcium 8.9 8.9 - 10.3 mg/dL   GFR, Estimated >60 >60 mL/min    Comment: (NOTE) Calculated using the CKD-EPI Creatinine Equation (2021)    Anion gap 8 5 - 15    Comment: Performed at [facility], [address] [place], [place] [address]  CBC with Differential     Status: Abnormal    Collection Time: 10/24/20  2:55 PM  Result Value Ref Range   WBC 5.2 4.0 - 10.5 K/uL   RBC 3.54 (L) 3.87 - 5.11 MIL/uL   Hemoglobin 9.6 (L) 12.0 - 15.0 g/dL   HCT 32.4 (L) 36.0 - 46.0 %   MCV 91.5 80.0 - 100.0 fL   MCH 27.1 26.0 - 34.0 pg   MCHC 29.6 (L) 30.0 - 36.0 g/dL   RDW 12.4 11.5 - 15.5 %   Platelets 191 150 - 400 K/uL   nRBC 0.0 0.0 - 0.2 %   Neutrophils Relative % 62 %   Neutro Abs 3.2 1.7 - 7.7 K/uL   Lymphocytes Relative 27 %   Lymphs Abs 1.4 0.7 - 4.0 K/uL   Monocytes Relative 7 %   Monocytes Absolute 0.3 0.1 - 1.0 K/uL   Eosinophils Relative 3 %  Eosinophils Absolute 0.2 0.0 - 0.5 K/uL   Basophils Relative 1 %   Basophils Absolute 0.0 0.0 - 0.1 K/uL   Immature Granulocytes 0 %   Abs Immature Granulocytes 0.01 0.00 - 0.07 K/uL    Comment: Performed at [facility], [address] [place], [place] [address]  Hepatic function panel     Status: None   Collection Time: 10/24/20  2:55 PM  Result Value Ref Range   Total Protein 7.7 6.5 - 8.1 g/dL   Albumin 3.9 3.5 - 5.0 g/dL   AST 19 15 - 41 U/L   ALT 17 0 - 44 U/L   Alkaline Phosphatase 115 38 - 126 U/L   Total Bilirubin 0.5 0.3 - 1.2 mg/dL   Bilirubin, Direct <0.1 0.0 - 0.2 mg/dL   Indirect Bilirubin NOT CALCULATED 0.3 - 0.9 mg/dL    Comment: Performed at [facility], [address] [place], [place] [address]  CUP PACEART REMOTE DEVICE CHECK     Status: None   Collection Time: 11/28/20  4:22 PM  Result Value Ref Range   Date Time Interrogation Session [id number]    Pulse Generator Manufacturer BOST    Pulse Gen Model K064 ADVANTIO    Pulse Gen Serial Number [id number]    Clinic Name [facility] Pulse Generator Type Implantable Pulse Generator    Implantable Pulse Generator Implant Date 36644034    Implantable Lead Manufacturer [facility]    Implantable Lead Model 5076 CapSureFix Novus    Implantable Lead Serial Number [id number]    Implantable Lead Implant Date 56433295     Implantable Lead Location Detail 1 UNKNOWN    Implantable Lead Location [id number]    Implantable Lead Manufacturer MERM    Implantable Lead Model 5076 CapSureFix Novus    Implantable Lead Serial Number [id number]    Implantable Lead Implant Date 18841660    Implantable Lead Location Detail 1 UNKNOWN    Implantable Lead Location [phone]    Lead Channel Setting Sensing Sensitivity 2.5 mV   Lead Channel Setting Sensing Adaptation Mode Fixed Pacing    Lead Channel Setting Pacing Amplitude 2.0 V   Lead Channel Setting Pacing Pulse Width 0.4 ms   Lead Channel Setting Pacing Amplitude 2.4 V   Lead Channel Impedance Value 377 ohm   Lead Channel Pacing Threshold Amplitude 0.8 V   Lead Channel Pacing Threshold Pulse Width 0.4 ms   Lead Channel Impedance Value 540 ohm   Battery Status BOS    Battery Remaining Longevity 72 mo   Battery Remaining Percentage 76 %   Brady Statistic RA Percent Paced 68 %   Brady Statistic RV Percent Paced 99 %  Basic metabolic panel     Status: Abnormal   Collection Time: 12/25/20  4:46 PM  Result Value Ref Range   Sodium 140 135 - 145 mmol/L   Potassium 4.0 3.5 - 5.1 mmol/L   Chloride 103 98 - 111 mmol/L   CO2 33 (H) 22 - 32 mmol/L   Glucose, Bld 107 (H) 70 - 99 mg/dL    Comment: Glucose reference range applies only to samples taken after fasting for at least 8 hours.   BUN 20 8 - 23 mg/dL   Creatinine, Ser 0.99 0.44 - 1.00 mg/dL   Calcium 8.9 8.9 - 10.3 mg/dL   GFR, Estimated 59 (L) >60 mL/min    Comment: (NOTE) Calculated using the CKD-EPI Creatinine Equation (2021)    Anion gap 4 (L)  5 - 15    Comment: Performed at [facility], [place] [address] [place], [place] [address]  Digoxin level     Status: Abnormal   Collection Time: 12/25/20  4:46 PM  Result Value Ref Range   Digoxin Level 0.5 (L) 0.8 - 2.0 ng/mL    Comment: Performed at [facility], [place] [address], [place], [place] [address]  Troponin I (High Sensitivity)     Status: None    Collection Time: 01/15/21  2:28 PM  Result Value Ref Range   High Sens Troponin I 13 2 - 17 ng/L  D-Dimer, Quantitative     Status: Abnormal   Collection Time: 01/15/21  2:57 PM  Result Value Ref Range   D-Dimer, Quant 0.52 (H) <0.50 mcg/mL FEU    Comment: . The D-Dimer test is used frequently to exclude an acute PE or DVT. In patients with a low to moderate clinical risk assessment and a D-Dimer result <0.50 mcg/mL FEU, the likelihood of a PE or DVT is very low. However, a thromboembolic event should not be excluded solely on the basis of the D-Dimer level. Increased levels of D-Dimer are associated with a PE, DVT, DIC, malignancies, inflammation, sepsis, surgery, trauma, pregnancy, and advancing patient age. [Jama 2006 11:295(2):199-207] . For additional information, please refer to: http://education.questdiagnostics.com/faq/FAQ149 (This link is being provided for informational/ educational purposes only) .   CBC w/Diff     Status: Abnormal   Collection Time: 01/15/21  2:57 PM  Result Value Ref Range   WBC 5.3 4.0 - 10.5 K/uL   RBC 3.76 (L) 3.87 - 5.11 Mil/uL   Hemoglobin 10.4 (L) 12.0 - 15.0 g/dL   HCT 32.8 (L) 36.0 - 46.0 %   MCV 87.3 78.0 - 100.0 fl   MCHC 31.6 30.0 - 36.0 g/dL   RDW 12.5 11.5 - 15.5 %   Platelets 180.0 150.0 - 400.0 K/uL   Neutrophils Relative % 65.7 43.0 - 77.0 %   Lymphocytes Relative 23.9 12.0 - 46.0 %   Monocytes Relative 6.5 3.0 - 12.0 %   Eosinophils Relative 3.2 0.0 - 5.0 %   Basophils Relative 0.7 0.0 - 3.0 %   Neutro Abs 3.5 1.4 - 7.7 K/uL   Lymphs Abs 1.3 0.7 - 4.0 K/uL   Monocytes Absolute 0.3 0.1 - 1.0 K/uL   Eosinophils Absolute 0.2 0.0 - 0.7 K/uL   Basophils Absolute 0.0 0.0 - 0.1 K/uL  Iron, TIBC and Ferritin Panel     Status: Abnormal   Collection Time: 01/15/21  2:57 PM  Result Value Ref Range   Iron 78 45 - 160 mcg/dL   TIBC 336 250 - 450 mcg/dL (calc)   %SAT 23 16 - 45 % (calc)   Ferritin 365 (H) 16 - 288 ng/mL    Objective  Body mass index is 38.43 kg/m. Wt Readings from Last 3 Encounters:  01/15/21 196 lb 12.8 oz (89.3 kg)  01/10/21 191 lb 3.2 oz (86.7 kg)  12/25/20 198 lb 6.4 oz (90 kg)   Temp Readings from Last 3 Encounters:  01/15/21 (!) 97.2 F (36.2 C) (Temporal)  11/12/20 (!) 96.4 F (35.8 C) (Tympanic)  10/29/20 97.9 F (36.6 C) (Tympanic)   BP Readings from Last 3 Encounters:  01/15/21 140/70  01/10/21 (!) 141/76  12/25/20 (!) 144/78   Pulse Readings from Last 3 Encounters:  01/15/21 61  01/10/21 68  12/25/20 60    Physical Exam Vitals and nursing note reviewed.  Constitutional:      Appearance: Normal appearance. She is well-developed and well-groomed.  HENT:     Head: Normocephalic and atraumatic.  Cardiovascular:     Rate and Rhythm: Normal rate and regular rhythm.     Heart sounds: Normal heart sounds.  Pulmonary:     Effort: Pulmonary effort is normal.     Breath sounds: Normal breath sounds.  Chest:     Chest wall: Tenderness present.    Skin:    General: Skin is warm and moist.  Neurological:     General: No focal deficit present.     Mental Status: She is alert and oriented to person, place, and time.     Gait: Gait normal.  Psychiatric:        Attention and Perception: Attention and perception normal.        Mood and Affect: Mood and affect normal.        Speech: Speech normal.        Behavior: Behavior normal. Behavior is cooperative.        Thought Content: Thought content normal.        Cognition and Memory: Cognition and memory normal.    Assessment  Plan  Atypical chest pain ? Costochondritis with h/o of this vs chest pain and elevated d dimer r/o PE/dvt h/o pulm HTN CTA chest and [place] b/l legs with left leg swelling - Plan: Troponin I (High Sensitivity), D-Dimer, Quantitative, DG Chest 2 View, EKG 12-Lead Can try ice/heat tylenol, lidocaine/aspercream with lidocaine   Anemia, unspecified type - Plan: CBC w/Diff, Iron, TIBC and Ferritin  Panel   Breast pain, right - Plan: MM DIAG BREAST TOMO BILATERAL, US BREAST LTD UNI RIGHT INC AXILLA Lost to f/u last in 2021 abnormal   Provider: Dr. Olivia Mackie McLean-Scocuzza-Internal Medicine

## 2021-01-17 ENCOUNTER — Telehealth (HOSPITAL_COMMUNITY): Payer: Self-pay | Admitting: Pharmacy Technician

## 2021-01-17 NOTE — Telephone Encounter (Signed)
Spoke with patient, she received the application yesterday and has it filled in along with POI. I asked her if she wanted to bring it to the office so that I could fax it for her, since it would be quicker than mail.  Stated that transportation is a barrier, messages [name] (EMT) to see if she could help [place] with that. If not, patient will mail her portion in to [employer].

## 2021-01-17 NOTE — Telephone Encounter (Signed)
Advanced Heart Failure Patient Advocate Encounter  I received a message that the patient has been out of Opsumit and needs a refill. The patient's current [place] is out of funds and there are no other grant options available.   The patient was directed by [place] to contact [employer] for assistance with the medication.  I called [employer] to check the status of the patient's application. She was mailed an application on 3/11 and has not returned the document to [employer]. I started an application for Opsumit assistance as well. Called the patient to see if she would like to bring in POI to the office and sign the paperwork here on daughter cell. Had to leave VM.

## 2021-01-18 ENCOUNTER — Other Ambulatory Visit: Payer: Self-pay

## 2021-01-18 ENCOUNTER — Ambulatory Visit
Admission: RE | Admit: 2021-01-18 | Discharge: 2021-01-18 | Disposition: A | Discharge status: Home / Self Care | Payer: Medicare HMO | Source: Ambulatory Visit | Attending: Internal Medicine | Admitting: Internal Medicine

## 2021-01-18 DIAGNOSIS — R922 Inconclusive mammogram: Secondary | ICD-10-CM | POA: Diagnosis not present

## 2021-01-18 DIAGNOSIS — N644 Mastodynia: Secondary | ICD-10-CM | POA: Insufficient documentation

## 2021-01-18 NOTE — Telephone Encounter (Signed)
Sent in referral via fax.  Will follow up.

## 2021-01-21 ENCOUNTER — Ambulatory Visit: Admission: RE | Admit: 2021-01-21 | Payer: Medicare HMO | Source: Ambulatory Visit

## 2021-01-21 ENCOUNTER — Ambulatory Visit: Payer: Medicare HMO

## 2021-01-21 NOTE — Telephone Encounter (Signed)
Spoke with [employer] regarding Opsumit refill, they are going to reach out to the patient for counseling and to place the refill. Called and told the patient to expect a call from an 800# today regarding Opsumit refill.  Sent in [place] assistance application via efax.  Will follow up.

## 2021-01-21 NOTE — Telephone Encounter (Addendum)
Advanced Heart Failure Patient Advocate Encounter   Patient was approved to receive Opsumit from [employer].  Patient ID: [id number] Effective dates: 01/18/21 through 03/19/21  Patient is required to sign Medicare attestation form in order to be approved through the remainder of 2022. Called and spoke with the patient, will send Medicare attestation to [place] (EMT) to get patient's signature.   Will send in [place] referral after provider's signature is obtained.

## 2021-01-22 ENCOUNTER — Telehealth: Payer: Self-pay | Admitting: Family Medicine

## 2021-01-22 ENCOUNTER — Other Ambulatory Visit (HOSPITAL_COMMUNITY): Payer: Self-pay

## 2021-01-22 NOTE — Telephone Encounter (Signed)
Lft pt daughter a vm to call ofc to sch [place] and CT. Lamar Sprinkles

## 2021-01-23 NOTE — Telephone Encounter (Signed)
Advanced Heart Failure Patient Advocate Encounter   Patient was approved to receive Uptravi from [employer]  Patient ID: [id number] Effective dates: 01/22/21 through 03/23/21  The patient is required to sign the Medicare attestation form for both Opsumit and Uptravi in order to be granted assistance through the remainder of 2022. Emailed [name] (EMT) both forms for the patient to sign. The patient is aware that she will bring them the next time she comes for a home visit.   Will fax in once signatures are obtained.

## 2021-01-24 ENCOUNTER — Encounter (HOSPITAL_COMMUNITY): Payer: Self-pay

## 2021-01-24 ENCOUNTER — Other Ambulatory Visit (HOSPITAL_COMMUNITY): Payer: Self-pay | Admitting: *Deleted

## 2021-01-24 ENCOUNTER — Other Ambulatory Visit (HOSPITAL_COMMUNITY): Payer: Self-pay

## 2021-01-24 DIAGNOSIS — I270 Primary pulmonary hypertension: Secondary | ICD-10-CM

## 2021-01-24 MED ORDER — OPSUMIT 10 MG PO TABS: 10.0000 mg | ORAL_TABLET | Freq: Every day | ORAL | 11 refills | Status: DC

## 2021-01-24 NOTE — Telephone Encounter (Signed)
Called [facility] and confirmed that patient is scheduled to receive Debra Saunders tomorrow.  Charlann Boxer, CPhT

## 2021-01-24 NOTE — Telephone Encounter (Signed)
Advanced Heart Failure Patient Advocate Encounter  Called [facility]  [phone] to check status of patient's referral. Gave clarification on dosing information. Advised representative that the patient would be out today. Would like to get medication shipped today for an arrival date of tomorrow. She advised that she would send over to the processing team to try and accommodate that.   Will follow up.

## 2021-01-24 NOTE — Progress Notes (Signed)
Today had a home visit with Debra Saunders.  She states doing ok, she has been house cleaning, she has company coming in for an [place] this weekend.  She has heard from the [place] and will be delivered tomorrow.  Her Opsumit is being delivered today.  She has all her other medications.  Fixed 2 weeks of medications and she will place the 2 that is coming.  She has received a bill from [facility] for [phone] dollars.  She states she does not have the money to pay this.  Will reach out to HF clinic in [place] for guidance on this.  This is where she was getting her Uptravi.  She has everything for daily living.  She denies any problems such as chest pain, headaches, dizziness or increased shortness of breath.  Will continue to visit for heart failure.   [place] [phone]

## 2021-01-25 NOTE — Telephone Encounter (Signed)
Lft pt daughter vm to call ofc to sch CT and [place]. thanks

## 2021-01-28 ENCOUNTER — Other Ambulatory Visit: Payer: Self-pay

## 2021-01-28 ENCOUNTER — Encounter: Payer: Self-pay | Admitting: Internal Medicine

## 2021-01-28 ENCOUNTER — Inpatient Hospital Stay: Payer: Medicare HMO

## 2021-01-28 ENCOUNTER — Inpatient Hospital Stay: Payer: Medicare HMO | Admitting: Internal Medicine

## 2021-01-28 ENCOUNTER — Inpatient Hospital Stay: Payer: Medicare HMO | Attending: Internal Medicine

## 2021-01-28 DIAGNOSIS — Z79899 Other long term (current) drug therapy: Secondary | ICD-10-CM | POA: Insufficient documentation

## 2021-01-28 DIAGNOSIS — D509 Iron deficiency anemia, unspecified: Secondary | ICD-10-CM | POA: Diagnosis not present

## 2021-01-28 DIAGNOSIS — E538 Deficiency of other specified B group vitamins: Secondary | ICD-10-CM | POA: Insufficient documentation

## 2021-01-28 DIAGNOSIS — E611 Iron deficiency: Secondary | ICD-10-CM

## 2021-01-28 DIAGNOSIS — Z8673 Personal history of transient ischemic attack (TIA), and cerebral infarction without residual deficits: Secondary | ICD-10-CM | POA: Diagnosis not present

## 2021-01-28 DIAGNOSIS — Z87891 Personal history of nicotine dependence: Secondary | ICD-10-CM | POA: Insufficient documentation

## 2021-01-28 DIAGNOSIS — Z7901 Long term (current) use of anticoagulants: Secondary | ICD-10-CM | POA: Diagnosis not present

## 2021-01-28 DIAGNOSIS — I4819 Other persistent atrial fibrillation: Secondary | ICD-10-CM | POA: Insufficient documentation

## 2021-01-28 DIAGNOSIS — I132 Hypertensive heart and chronic kidney disease with heart failure and with stage 5 chronic kidney disease, or end stage renal disease: Secondary | ICD-10-CM | POA: Diagnosis not present

## 2021-01-28 DIAGNOSIS — I130 Hypertensive heart and chronic kidney disease with heart failure and stage 1 through stage 4 chronic kidney disease, or unspecified chronic kidney disease: Secondary | ICD-10-CM | POA: Diagnosis not present

## 2021-01-28 DIAGNOSIS — Z86718 Personal history of other venous thrombosis and embolism: Secondary | ICD-10-CM | POA: Insufficient documentation

## 2021-01-28 DIAGNOSIS — Z803 Family history of malignant neoplasm of breast: Secondary | ICD-10-CM | POA: Diagnosis not present

## 2021-01-28 DIAGNOSIS — N186 End stage renal disease: Secondary | ICD-10-CM | POA: Insufficient documentation

## 2021-01-28 LAB — CBC WITH DIFFERENTIAL/PLATELET
Abs Immature Granulocytes: 0.02 10*3/uL (ref 0.00–0.07)
Basophils Absolute: 0 10*3/uL (ref 0.0–0.1)
Basophils Relative: 1 %
Eosinophils Absolute: 0.2 10*3/uL (ref 0.0–0.5)
Eosinophils Relative: 3 %
HCT: 31.6 % — ABNORMAL LOW (ref 36.0–46.0)
Hemoglobin: 9.4 g/dL — ABNORMAL LOW (ref 12.0–15.0)
Immature Granulocytes: 0 %
Lymphocytes Relative: 25 %
Lymphs Abs: 1.4 10*3/uL (ref 0.7–4.0)
MCH: 27.4 pg (ref 26.0–34.0)
MCHC: 29.7 g/dL — ABNORMAL LOW (ref 30.0–36.0)
MCV: 92.1 fL (ref 80.0–100.0)
Monocytes Absolute: 0.3 10*3/uL (ref 0.1–1.0)
Monocytes Relative: 5 %
Neutro Abs: 3.7 10*3/uL (ref 1.7–7.7)
Neutrophils Relative %: 66 %
Platelets: 180 10*3/uL (ref 150–400)
RBC: 3.43 MIL/uL — ABNORMAL LOW (ref 3.87–5.11)
RDW: 12.1 % (ref 11.5–15.5)
WBC: 5.5 10*3/uL (ref 4.0–10.5)
nRBC: 0 % (ref 0.0–0.2)

## 2021-01-28 LAB — BASIC METABOLIC PANEL
Anion gap: 8 (ref 5–15)
BUN: 28 mg/dL — ABNORMAL HIGH (ref 8–23)
CO2: 28 mmol/L (ref 22–32)
Calcium: 8.6 mg/dL — ABNORMAL LOW (ref 8.9–10.3)
Chloride: 102 mmol/L (ref 98–111)
Creatinine, Ser: 1.03 mg/dL — ABNORMAL HIGH (ref 0.44–1.00)
GFR, Estimated: 56 mL/min — ABNORMAL LOW (ref >60)
Glucose, Bld: 130 mg/dL — ABNORMAL HIGH (ref 70–99)
Potassium: 4.1 mmol/L (ref 3.5–5.1)
Sodium: 138 mmol/L (ref 135–145)

## 2021-01-28 MED ORDER — IRON SUCROSE 20 MG/ML IV SOLN
200.0000 mg | Freq: Once | INTRAVENOUS | Status: AC
Start: 2021-01-28 — End: 2021-01-28
  Administered 2021-01-28: 200 mg via INTRAVENOUS
  Filled 2021-01-28: qty 10

## 2021-01-28 MED ORDER — SODIUM CHLORIDE 0.9 % IV SOLN
Freq: Once | INTRAVENOUS | Status: AC
Start: 2021-01-28 — End: 2021-01-28
  Filled 2021-01-28: qty 250

## 2021-01-28 MED ORDER — CYANOCOBALAMIN 1000 MCG/ML IJ SOLN
1000.0000 ug | Freq: Once | INTRAMUSCULAR | Status: AC
  Administered 2021-01-28: 1000 ug via INTRAMUSCULAR
  Filled 2021-01-28: qty 1

## 2021-01-28 NOTE — Assessment & Plan Note (Addendum)
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 CKD stage III [today GFR >60] currently s/p Venofe. Hemoglobin today is 9.6 improved from around 8. MAY 1HA,5790- Iron sat-16%.  Proceed with Venofer today.   # Etiology of anemia- ? Chronic diarrhea/malabsopbtion/CKD-iron saturation 18%.  #? CKD stage III-GFR 60- STABLE.  #History of DVT/s/p IVC filter; ?  A. Fib-Eliquis-STABLE>   #Borderline low B12-230; s/p B12 injection. B12 injection today.   # DISPOSITION: # venofer/b12 today # venofer in 2 weeks # Follow up in 3 months-NP-labs- cbc;bmp; possible Venofer / B12 injection- Dr.B   Cc; Tammi Klippel

## 2021-01-28 NOTE — Progress Notes (Signed)
[place] NOTE  Patient Care Team: [name], MD as PCP - General (Family Medicine) Wellington Hampshire, MD as PCP - Cardiology (Cardiology) Deboraha Sprang, MD as PCP - Electrophysiology (Cardiology) Thom Chimes, MD as Referring Physician (Allergy) Juanito Doom, MD as Referring Physician (Internal Medicine) De Hollingshead, RPH-CPP as Pharmacist (Pharmacist) Cammie Sickle, MD as Consulting Physician (Hematology and Oncology) Jon Billings, RN as [place] Management Anemia CHIEF COMPLAINTS/PURPOSE OF CONSULTATION: Anemia   HEMATOLOGY HISTORY  # CHORNIC IRON DEFICIENCY ANEMIA [BL 10-11]-Jan today 21 PCP Hb-8.8; Antonieta Pert sat-5%; ferritin 11] EGD-; colonoscopy MARCH 2020 -polyp [[facility]-hold further work-up secondary to cardiac issues]; remote-EGD-?;  February 2021-LDH/haptoglobin normal; kappa lambda light chain ratio/M panel normal;  on Venofer; November 2020-CT scan question early cirrhosis/no splenomegaly.  No acute process; MARCH 2021- STOOL OCCULT x3- NEGATIVE.   #B12 deficiency-IM every 3 to 4 months  #2014 -question blood transfusion  [patient admitted for stroke]; CHF chronic -[ 2004 on 4 lits; Dr.Arida/Klein]; CT scan chest 2021 -no adenopathy; fibrotic changes; walker; CKD stage III-   #Incidental right axillary mass-s/p US biopsy [PCP] -negative for malignancy.  HISTORY OF PRESENTING ILLNESS:  Debra Saunders 75 y.o.  female anemia/likely secondary to ?CKD stage III is here for follow-up.  Patient complains of fatigue.  Otherwise denies any blood in stools or black or stools.  No nausea or vomiting.  Patient continues with oxygen   Review of Systems  Constitutional:  Positive for malaise/fatigue. Negative for chills, diaphoresis, fever and weight loss.  HENT:  Negative for nosebleeds and sore throat.   Eyes:  Negative for double vision.  Respiratory:  Positive for shortness of breath. Negative for cough,  hemoptysis, sputum production and wheezing.   Cardiovascular:  Negative for chest pain, palpitations, orthopnea and leg swelling.  Gastrointestinal:  Positive for diarrhea. Negative for abdominal pain, blood in stool, constipation, heartburn, melena, nausea and vomiting.  Genitourinary:  Negative for dysuria, frequency and urgency.  Musculoskeletal:  Negative for back pain and joint pain.  Skin: Negative.  Negative for itching and rash.  Neurological:  Negative for dizziness, tingling, focal weakness, weakness and headaches.  Endo/Heme/Allergies:  Does not bruise/bleed easily.  Psychiatric/Behavioral:  Negative for depression. The patient is not nervous/anxious and does not have insomnia.    MEDICAL HISTORY:  Past Medical History:  Diagnosis Date  . Arthritis   . Atrial fibrillation, persistent ([place])    a. s/p TEE-guided DCCV on 08/20/2017  . Cardiorenal syndrome    a. 09/2017 Creat rose to 2.7 w/ diuresis-->HD x 2.  . Chest pain    a. 01/2012 Cath: nonobs dzs;  c. 04/2014 MV: No ischemia.  . Chronic back pain   . Chronic combined systolic (congestive) and diastolic (congestive) heart failure ([place])    a. Dx 2005 @ [facility];  b. 02/2014 Echo: EF 55-60%; c. 07/2017: EF 30-35%, diffuse HK, trivial AI, severe MR, moderate TR; d. 09/2017 Echo: EF 40-45%; e. 02/2018 Echo: EF 55-60%, Gr1 DD; f. 08/2019 Echo: EF 60-65%, Gr2 DD, Nl RV fxn, RVSP 49.53mmHg. Mod-Sev MR. Mild to mod TR. Mod PR. RA ~ 54mmHg.  [name] Chronic respiratory failure (HCC)    a. on home O2  . COPD (chronic obstructive pulmonary disease) ([place])    a. Home O2 - 3lpm  . Gastritis   . History of intracranial hemorrhage    a. 6/14 described by neurosurgery as "small frontal posttraumatic SAH " - pt was  on xarelto @ the time.  . Hyperlipidemia   . Hypertension   . Interstitial lung disease ([place])   . Lipoma of back   . Lymphedema    a. Chronic LLE edema.  [name] NICM (nonischemic cardiomyopathy) ([place])    a. 09/2017 Echo: EF 40-45%; b. 09/2017 Cath:  minor, insignificant CAD; c. 02/2018 Echo: EF 55-60%, Gr1 DD; d. 08/2019 Echo: EF 60-65%, Gr2 DD.  [name] Obesity   . PAH (pulmonary artery hypertension) ([place])    a. 09/2017 Echo: PASP 13mmHg; b. 09/2017 RHC: RA 23, RV 84/13, PA 84/29, PCWP 20, CO 3.89, CI 1.89. LVEDP 18.  . Presence of permanent cardiac pacemaker 01/09/2014  . Sepsis with metabolic encephalopathy ([place])   . Sleep apnea   . Streptococcal bacteremia   . Stroke ([place]) 2014  . Subarachnoid hemorrhage ([place]) 01/19/2013  . Symptomatic bradycardia    a. s/p BSX PPM.  . Thyroid nodule   . Type II diabetes mellitus ([place])   . Urinary incontinence     SURGICAL HISTORY: Past Surgical History:  Procedure Laterality Date  . ABDOMINAL HYSTERECTOMY  1969  . BREAST BIOPSY Right 10/28/2019   lymph node bx, [place] Bx, pending path   . CARDIAC CATHETERIZATION  2013   @ [place]: No obstructive CAD: Only 20% ostial left circumflex.  [name] CARDIOVERSION N/A 10/12/2017   Procedure: CARDIOVERSION;  Surgeon: Minna Merritts, MD;  Location: [facility] ORS;  Service: Cardiovascular;  Laterality: N/A;  . CATARACT EXTRACTION W/PHACO Right 09/19/2020   Procedure: CATARACT EXTRACTION PHACO AND INTRAOCULAR LENS PLACEMENT (IOC) RIGHT DIABETIC 3.95 00:43.0 9.2%;  Surgeon: Leandrew Koyanagi, MD;  Location: [facility];  Service: Ophthalmology;  Laterality: Right;  Pt requests to be last sleep apnea  . CATARACT EXTRACTION W/PHACO Left 10/03/2020   Procedure: CATARACT EXTRACTION PHACO AND INTRAOCULAR LENS PLACEMENT ([place]) LEFT DIABETIC;  Surgeon: Leandrew Koyanagi, MD;  Location: [facility];  Service: Ophthalmology;  Laterality: Left;  3.25 0:59.3 5.5%  . CHOLECYSTECTOMY    . COLONOSCOPY WITH PROPOFOL N/A 09/10/2018   Procedure: COLONOSCOPY WITH PROPOFOL;  Surgeon: Manya Silvas, MD;  Location[facility] ENDOSCOPY;  Service: Endoscopy;  Laterality: N/A;  . CYST REMOVAL TRUNK  2002   BACK  . DIALYSIS/PERMA CATHETER INSERTION N/A 10/06/2017   Procedure:  DIALYSIS/PERMA CATHETER INSERTION;  Surgeon: Katha Cabal, MD;  Location: [facility] CV LAB;  Service: Cardiovascular;  Laterality: N/A;  . INSERT / REPLACE / REMOVE PACEMAKER    . lipoma removal  2014   back  . PACEMAKER INSERTION  [address] [employer] dual chamber pacemaker implanted by Dr Caryl Comes for symptomatic bradycardia  . PERMANENT PACEMAKER INSERTION N/A 01/09/2014   Procedure: PERMANENT PACEMAKER INSERTION;  Surgeon: Deboraha Sprang, MD;  Location: [facility] CATH LAB;  Service: Cardiovascular;  Laterality: N/A;  . RIGHT HEART CATH N/A 04/19/2018   Procedure: RIGHT HEART CATH;  Surgeon: Larey Dresser, MD;  Location: [facility] CV LAB;  Service: Cardiovascular;  Laterality: N/A;  . RIGHT HEART CATH N/A 08/30/2020   Procedure: RIGHT HEART CATH;  Surgeon: Larey Dresser, MD;  Location: [place] CV LAB;  Service: Cardiovascular;  Laterality: N/A;  . RIGHT/LEFT HEART CATH AND CORONARY ANGIOGRAPHY N/A 10/19/2017   Procedure: RIGHT/LEFT HEART CATH AND CORONARY ANGIOGRAPHY;  Surgeon: Wellington Hampshire, MD;  Location: [place] CV LAB;  Service: Cardiovascular;  Laterality: N/A;  . TEE WITHOUT CARDIOVERSION N/A 08/20/2017   Procedure: TRANSESOPHAGEAL ECHOCARDIOGRAM (TEE) & Direct current cardioversion;  Surgeon:  Minna Merritts, MD;  Location: [facility] ORS;  Service: Cardiovascular;  Laterality: N/A;  . TRIGGER FINGER RELEASE      SOCIAL HISTORY: Social History   Socioeconomic History  . Marital status: Divorced    Spouse name: Not on file  . Number of children: 4  . Years of education: 67  . Highest education level: High school graduate  Occupational History  . Occupation: Retired- factory work    Comment: exposed to Pharmacist, community  Tobacco Use  . Smoking status: Former    Packs/day: 0.30    Years: 2.00    Pack years: 0.60    Types: Cigarettes    Quit date: 06/23/1990    Years since quitting: 30.6  . Smokeless tobacco: Never  Vaping Use  . Vaping Use: Never used   Substance and Sexual Activity  . Alcohol use: No  . Drug use: No  . Sexual activity: Not Currently  Other Topics Concern  . Not on file  Social History Narrative   Lives in [place] with daughter. Has 4 children. No pets.      Work - Restaurant manager, fast food, retired      Diet - regular      02/14/2019: reports has Meals On Wheels delivered to her home;       Remote hx of smoking > 30 years; No alcohol-    Social Determinants of Health   Financial Resource Strain: Low Risk   . Difficulty of Paying Living Expenses: Not very hard  Food Insecurity: No Food Insecurity  . Worried About Charity fundraiser in the Last Year: Never true  . Ran Out of Food in the Last Year: Never true  Transportation Needs: No Transportation Needs  . Lack of Transportation (Medical): No  . Lack of Transportation (Non-Medical): No  Physical Activity: Not on file  Stress: No Stress Concern Present  . Feeling of Stress : Only a little  Social Connections: Socially Isolated  . Frequency of Communication with Friends and Family: More than three times a week  . Frequency of Social Gatherings with Friends and Family: Once a week  . Attends Religious Services: Never  . Active Member of Clubs or Organizations: No  . Attends Archivist Meetings: Never  . Marital Status: Divorced  Human resources officer Violence: Not At Risk  . Fear of Current or Ex-Partner: No  . Emotionally Abused: No  . Physically Abused: No  . Sexually Abused: No    FAMILY HISTORY: Family History  Problem Relation Age of Onset  . Heart disease Mother   . Hypertension Mother   . COPD Mother        was a smoker  . Thyroid disease Mother   . Hypertension Father   . Hypertension Sister   . Thyroid disease Sister   . Breast cancer Sister   . Hypertension Sister   . Thyroid disease Sister   . Bone cancer Sister   . Breast cancer Maternal Aunt   . Kidney disease Neg Hx   . Bladder Cancer Neg Hx     ALLERGIES:  is allergic to  aspirin, other, and penicillins.  MEDICATIONS:  Current Outpatient Medications  Medication Sig Dispense Refill  . acetaminophen (TYLENOL) 325 MG tablet Take 325-650 mg by mouth every 6 (six) hours as needed for mild pain. Reported on 10/10/2015    . albuterol (ACCUNEB) 0.63 MG/3ML nebulizer solution Take 1 ampule by nebulization every 6 (six) hours as needed for wheezing.    [name]  apixaban (ELIQUIS) 5 MG TABS tablet Take 1 tablet (5 mg total) by mouth 2 (two) times daily. Needs appt for future refills 60 tablet 6  . cholestyramine (QUESTRAN) 4 GM/DOSE powder Take 4 g by mouth daily.    . digoxin (LANOXIN) 0.125 MG tablet Take 1 tablet (125 mcg total) by mouth daily. 90 tablet 3  . DUREZOL 0.05 % EMUL     . gabapentin (NEURONTIN) 100 MG capsule Take 2 capsules (200 mg total) by mouth 3 (three) times daily. 540 capsule 1  . KLOR-CON M20 20 MEQ tablet Take 1 tablet by mouth once daily 90 tablet 3  . Lancets MISC Use with True Metrix glucometer to check sugars BID 200 each 3  . macitentan (OPSUMIT) 10 MG tablet Take 1 tablet (10 mg total) by mouth daily. 30 tablet 11  . meclizine (ANTIVERT) 25 MG tablet Take 1 tablet (25 mg total) by mouth 3 (three) times daily as needed for dizziness. 30 tablet 1  . Menthol, Topical Analgesic, (BIOFREEZE EX) Apply 1 application topically 4 (four) times daily as needed (for pain).    . OXYGEN Inhale 4 L into the lungs continuous.    . rosuvastatin (CRESTOR) 10 MG tablet Take 1 tablet (10 mg total) by mouth daily. 90 tablet 3  . Selexipag (UPTRAVI) 1600 MCG TABS Take 1,600 mcg by mouth 2 (two) times daily.    . sildenafil (REVATIO) 20 MG tablet Take 1 tablet (20 mg total) by mouth 3 (three) times daily. 270 tablet 3  . spironolactone (ALDACTONE) 25 MG tablet Take 1 tablet (25 mg total) by mouth at bedtime. Please call for a office visit [phone] 90 tablet 2  . torsemide (DEMADEX) 20 MG tablet Take 3 tablets (60 mg total) by mouth 2 (two) times daily. 180 tablet 3  .  TRUE METRIX BLOOD GLUCOSE TEST test strip CHECK BLOOD GLUCOSE TWICE DAILY 200 strip 3  . ondansetron (ZOFRAN) 8 MG tablet Take 1 tablet (8 mg total) by mouth every 8 (eight) hours as needed for nausea or vomiting. (Patient not taking: Reported on 01/28/2021) 30 tablet 2   No current facility-administered medications for this visit.      PHYSICAL EXAMINATION:   Vitals:   01/28/21 1316  BP: 116/68  Pulse: (!) 59  Resp: 20  Temp: 97.6 F (36.4 C)  SpO2: 100%   Filed Weights   01/28/21 1316  Weight: 197 lb 8 oz (89.6 kg)    Physical Exam Constitutional:      Comments: Patient in wheelchair.  Alone.  She is on O2 nasal cannula.  HENT:     Head: Normocephalic and atraumatic.     Mouth/Throat:     Pharynx: No oropharyngeal exudate.  Eyes:     Pupils: Pupils are equal, round, and reactive to light.  Cardiovascular:     Rate and Rhythm: Normal rate and regular rhythm.  Pulmonary:     Effort: No respiratory distress.     Breath sounds: No wheezing.     Comments: Decreased air entry bilaterally.  No wheeze or crackles. Abdominal:     General: Bowel sounds are normal. There is no distension.     Palpations: Abdomen is soft. There is no mass.     Tenderness: There is no abdominal tenderness. There is no guarding or rebound.  Musculoskeletal:        General: No tenderness. Normal range of motion.     Cervical back: Normal range of motion and neck supple.  Skin:    General: Skin is warm.  Neurological:     Mental Status: She is alert and oriented to person, place, and time.  Psychiatric:        Mood and Affect: Affect normal.    LABORATORY DATA:  I have reviewed the data as listed Lab Results  Component Value Date   WBC 5.5 01/28/2021   HGB 9.4 (L) 01/28/2021   HCT 31.6 (L) 01/28/2021   MCV 92.1 01/28/2021   PLT 180 01/28/2021   Recent Labs    01/30/20 1127 01/30/20 1127 03/26/20 1317 04/10/20 1354 04/20/20 1435 06/13/20 1621 06/25/20 1334 08/27/20 1257  08/30/20 1117 10/24/20 1455 12/25/20 1646 01/28/21 1256  NA 139  --  138 139  --  140   < > 140   < > 140 140 138  K 3.8  --  3.7 3.8  --  4.0   < > 4.0   < > 4.4 4.0 4.1  CL 103  --  104 102  --  102   < > 104  --  102 103 102  CO2 26  --  27 31  --  25   < > 27  --  30 33* 28  GLUCOSE 97  --  113* 99  --  111*   < > 113*  --  104* 107* 130*  BUN 26*  --  26* 30*  --  23   < > 32*  --  27* 20 28*  CREATININE 1.13*  --  0.89 1.08  --  1.02*   < > 1.00   < > 0.95 0.99 1.03*  CALCIUM 8.6*  --  8.4* 8.9  --  8.8   < > 8.6*  --  8.9 8.9 8.6*  GFRNONAA 47*  --  >60  --   --  54*   < > 58*  --  >60 59* 56*  GFRAA 55*  --  >60  --   --  62  --   --   --   --   --   --   PROT  --    < > 7.3 6.9  --   --   --  7.4  --  7.7  --   --   ALBUMIN  --    < > 3.6 4.0  --   --   --  3.8  --  3.9  --   --   AST  --    < > 16 13  --   --   --  15  --  19  --   --   ALT  --    < > 19 7  --   --   --  12  --  17  --   --   ALKPHOS  --    < > 137* 133* 163*  --   --  107  --  115  --   --   BILITOT  --    < > 0.6 0.4  --   --   --  0.6  --  0.5  --   --   BILIDIR  --   --   --   --   --   --   --  0.1  --  <0.1  --   --   IBILI  --   --   --   --   --   --   --  0.5  --  NOT CALCULATED  --   --    < > = values in this interval not displayed.     DG Chest 2 View  Result Date: 01/15/2021 CLINICAL DATA:  Chest pain. EXAM: CHEST - 2 VIEW COMPARISON:  July 12, 2020. FINDINGS: Stable cardiomegaly. Left-sided pacemaker is unchanged in position. No pneumothorax or pleural effusion is noted. Stable right upper lobe scarring is noted. No definite acute pulmonary disease is noted. Bony thorax is unremarkable. IMPRESSION: Stable right upper lobe scarring.  No acute abnormality seen. Electronically Signed   By: Marijo Conception M.D.   On: 01/15/2021 15:44   US BREAST LTD UNI RIGHT INC AXILLA  Result Date: 01/18/2021 CLINICAL DATA:  75 year old with focal pain involving the far inner RIGHT breast near the cleavage  space. Annual evaluation, LEFT breast. EXAM: DIGITAL DIAGNOSTIC BILATERAL MAMMOGRAM WITH TOMOSYNTHESIS AND CAD; ULTRASOUND RIGHT BREAST LIMITED TECHNIQUE: Bilateral digital diagnostic mammography and breast tomosynthesis was performed. The images were evaluated with computer-aided detection.; Targeted ultrasound examination of the right breast was performed COMPARISON:  Previous exam(s). ACR Breast Density Category b: There are scattered areas of fibroglandular density. FINDINGS: Full field CC and MLO views of both breasts and the spot tangential view of the palpable concern in the RIGHT breast were obtained. RIGHT: There is a skin mole adjacent to the area of palpable concern in the far inner breast. A normal intramammary lymph node is present deep at this location which has been stable dating back to the 2017 mammogram, confirming benignity. No findings suspicious for malignancy. Targeted ultrasound is performed the area of focal pain and tenderness, demonstrating normal fibrofatty tissue at the 3 o'clock position approximately 13 cm from the nipple. The costal cartilage of an anterior rib is present in this location. No cyst, solid mass or abnormal acoustic shadowing is identified. LEFT: No findings suspicious for malignancy. Battery generator pack for the indwelling pacemaker overlies the low axilla on the MLO view. On correlative physical examination, the patient is exquisitely tender to palpation over the costal cartilage of anterior RIGHT ribs near the sternal margin. There is no palpable mass in the inner RIGHT breast. IMPRESSION: 1. No mammographic or sonographic evidence of malignancy involving the RIGHT breast. 2. No mammographic evidence of malignancy involving the LEFT breast. 3. As the patient's pain and tenderness is present involving the costal cartilage of anterior RIGHT ribs near their insertion on the sternum, costochondritis is suspected. RECOMMENDATION: Screening mammogram in one  year.(Code:SM-B-01Y) I have discussed the findings and recommendations with the patient. If applicable, a reminder letter will be sent to the patient regarding the next appointment. BI-RADS CATEGORY  2: Benign. Electronically Signed   By: Evangeline Dakin M.D.   On: 01/18/2021 14:20  MM DIAG BREAST TOMO BILATERAL  Result Date: 01/18/2021 CLINICAL DATA:  75 year old with focal pain involving the far inner RIGHT breast near the cleavage space. Annual evaluation, LEFT breast. EXAM: DIGITAL DIAGNOSTIC BILATERAL MAMMOGRAM WITH TOMOSYNTHESIS AND CAD; ULTRASOUND RIGHT BREAST LIMITED TECHNIQUE: Bilateral digital diagnostic mammography and breast tomosynthesis was performed. The images were evaluated with computer-aided detection.; Targeted ultrasound examination of the right breast was performed COMPARISON:  Previous exam(s). ACR Breast Density Category b: There are scattered areas of fibroglandular density. FINDINGS: Full field CC and MLO views of both breasts and the spot tangential view of the palpable concern in the RIGHT breast were obtained. RIGHT: There is a skin mole adjacent to the area of palpable concern in the far  inner breast. A normal intramammary lymph node is present deep at this location which has been stable dating back to the 2017 mammogram, confirming benignity. No findings suspicious for malignancy. Targeted ultrasound is performed the area of focal pain and tenderness, demonstrating normal fibrofatty tissue at the 3 o'clock position approximately 13 cm from the nipple. The costal cartilage of an anterior rib is present in this location. No cyst, solid mass or abnormal acoustic shadowing is identified. LEFT: No findings suspicious for malignancy. Battery generator pack for the indwelling pacemaker overlies the low axilla on the MLO view. On correlative physical examination, the patient is exquisitely tender to palpation over the costal cartilage of anterior RIGHT ribs near the sternal margin. There  is no palpable mass in the inner RIGHT breast. IMPRESSION: 1. No mammographic or sonographic evidence of malignancy involving the RIGHT breast. 2. No mammographic evidence of malignancy involving the LEFT breast. 3. As the patient's pain and tenderness is present involving the costal cartilage of anterior RIGHT ribs near their insertion on the sternum, costochondritis is suspected. RECOMMENDATION: Screening mammogram in one year.(Code:SM-B-01Y) I have discussed the findings and recommendations with the patient. If applicable, a reminder letter will be sent to the patient regarding the next appointment. BI-RADS CATEGORY  2: Benign. Electronically Signed   By: Evangeline Dakin M.D.   On: 01/18/2021 14:20    Iron deficiency # Chronic Anemia [BL10-11]-iron deficiency/? CKD stage III [today GFR >60] currently s/p Venofe. Hemoglobin today is 9.6 improved from around 8. MAY 0JW,1191- Iron sat-16%.  Proceed with Venofer today.   # Etiology of anemia- ? Chronic diarrhea/malabsopbtion/CKD-iron saturation 18%.  #? CKD stage III-GFR 60- STABLE.  #History of DVT/s/p IVC filter; ?  A. Fib-Eliquis-STABLE>   #Borderline low B12-230; s/p B12 injection. B12 injection today.   # DISPOSITION: # venofer/b12 today # venofer in 2 weeks # Follow up in 3 months-NP-labs- cbc;bmp; possible Venofer / B12 injection- Dr.B   Cc; Tammi Klippel  All questions were answered. The patient knows to call the clinic with any problems, questions or concerns.    Cammie Sickle, MD 01/28/2021 10:23 PM

## 2021-01-29 ENCOUNTER — Telehealth (HOSPITAL_COMMUNITY): Payer: Self-pay | Admitting: Pharmacy Technician

## 2021-01-29 NOTE — Telephone Encounter (Signed)
Advanced Heart Failure Patient Advocate Encounter  I received a message from [place] (EMT) that the patient has had some charging discrepancies with [facility] regarding Uptravi.  I called and spoke with the patient who stated that she authorized [facility] to send a 7 day supply to her, she received 30 days. She opened the bottle and has been actively taking the 30 day supply that was sent to her. I asked her if she called and spoke with anyone at [facility] about the discrepancy. She stated that she had not, the bill that was sent was $800.   I told her that she needs to call [facility]. There should be a record in their call log history that states 7 v 30 days of Uptravi. The fact that the patient accepted the delivery and did not send it back, means she will likely be stuck with the bill. I encouraged her to always call the pharmacy first when things like this happen. Her grant is out of funds, I have no options to help her resolve this particular issue.   Charlann Boxer, CPhT

## 2021-01-30 ENCOUNTER — Telehealth: Payer: Self-pay | Admitting: Family Medicine

## 2021-01-30 NOTE — Telephone Encounter (Signed)
Lft pt daughter a vm to call ofc to sch CT . thanks 

## 2021-01-31 ENCOUNTER — Other Ambulatory Visit: Payer: Self-pay

## 2021-01-31 NOTE — Patient Outreach (Signed)
[facility]) Care Management  01/31/2021  Debra Saunders Sep 30, 1945 [id number]   Telephone call to patient for disease management follow up.   No answer.  HIPAA compliant voice message left.    Plan: If no return call, RN CM will attempt patient again in the month of November.    [name], RN, MSN [facility] Care Management Coordinator Direct Line [phone] Cell [phone] Toll Free: [phone]  Fax: [phone]

## 2021-02-04 ENCOUNTER — Other Ambulatory Visit (HOSPITAL_COMMUNITY): Payer: Self-pay

## 2021-02-04 ENCOUNTER — Other Ambulatory Visit (HOSPITAL_COMMUNITY): Payer: Self-pay | Admitting: Cardiology

## 2021-02-04 NOTE — Progress Notes (Signed)
Today had a home visit with [name].  She states tired but ok.  She states back to sleeping longer in the day.  She states just can not go to sleep earlier.  Appetite is good, she states drinking enough water.  She takes all her medications.  She signed the paperwork for temporary fro her upsumit and [name], will fax to [employer] for her.  She has everything for daily living. She is aware of how to take her medications and can fix her own med boxes.  She has fixed 1 week, I filled a second week for her.  Called in refill for spironolactone.  She is aware to pick up.  She is aware to make her a pulmonology appt, she will get her daughter to make the appt.  She denies any chest pain, headaches or dizziness.  Her eyes are watering a lot, she states been like that since her surgery.  She has been doing a lot of cleaning around the home, she states that is why she is tired.  She is aware of up coming appts, she advised me her iron levels are not coming up.  Will continue to visit for heart failure, diet and medication compliance.   [place] [phone]

## 2021-02-05 NOTE — Telephone Encounter (Signed)
Advanced Heart Failure Patient Advocate Encounter  Patient was approved to receive Uptravi and Opsumit through 06/22/21 from [employer]. The [name] will come from [name] [phone]) and Opsumit will come from [place] [phone]). Sent information to [employer] (EMT).  Charlann Boxer, CPhT

## 2021-02-13 ENCOUNTER — Ambulatory Visit: Payer: Medicare HMO | Admitting: Family Medicine

## 2021-02-15 ENCOUNTER — Inpatient Hospital Stay: Payer: Medicare HMO

## 2021-02-15 VITALS — BP 120/56 | HR 60 | Temp 97.8°F | Resp 18

## 2021-02-15 DIAGNOSIS — I4819 Other persistent atrial fibrillation: Secondary | ICD-10-CM | POA: Diagnosis not present

## 2021-02-15 DIAGNOSIS — I130 Hypertensive heart and chronic kidney disease with heart failure and stage 1 through stage 4 chronic kidney disease, or unspecified chronic kidney disease: Secondary | ICD-10-CM | POA: Diagnosis not present

## 2021-02-15 DIAGNOSIS — Z79899 Other long term (current) drug therapy: Secondary | ICD-10-CM | POA: Diagnosis not present

## 2021-02-15 DIAGNOSIS — E538 Deficiency of other specified B group vitamins: Secondary | ICD-10-CM

## 2021-02-15 DIAGNOSIS — Z8673 Personal history of transient ischemic attack (TIA), and cerebral infarction without residual deficits: Secondary | ICD-10-CM | POA: Diagnosis not present

## 2021-02-15 DIAGNOSIS — D509 Iron deficiency anemia, unspecified: Secondary | ICD-10-CM | POA: Diagnosis not present

## 2021-02-15 DIAGNOSIS — E611 Iron deficiency: Secondary | ICD-10-CM

## 2021-02-15 DIAGNOSIS — N186 End stage renal disease: Secondary | ICD-10-CM | POA: Diagnosis not present

## 2021-02-15 DIAGNOSIS — I132 Hypertensive heart and chronic kidney disease with heart failure and with stage 5 chronic kidney disease, or end stage renal disease: Secondary | ICD-10-CM | POA: Diagnosis not present

## 2021-02-15 DIAGNOSIS — Z7901 Long term (current) use of anticoagulants: Secondary | ICD-10-CM | POA: Diagnosis not present

## 2021-02-15 MED ORDER — IRON SUCROSE 20 MG/ML IV SOLN
200.0000 mg | Freq: Once | INTRAVENOUS | Status: AC
  Administered 2021-02-15: 200 mg via INTRAVENOUS
  Filled 2021-02-15: qty 10

## 2021-02-15 MED ORDER — SODIUM CHLORIDE 0.9 % IV SOLN
Freq: Once | INTRAVENOUS | Status: AC
  Filled 2021-02-15: qty 250

## 2021-02-15 NOTE — Patient Instructions (Signed)
[facility] [place] [facility]   Discharge Instructions: Thank you for choosing [facility] to provide your oncology and hematology care.  If you have a lab appointment with the Cancer Center, please go directly to the Cancer Center and check in at the registration area.  We strive to give you quality time with your provider. You may need to reschedule your appointment if you arrive late (15 or more minutes).  Arriving late affects you and other patients whose appointments are after yours.  Also, if you miss three or more appointments without notifying the office, you may be dismissed from the clinic at the provider's discretion.      For prescription refill requests, have your pharmacy contact our office and allow 72 hours for refills to be completed.    Today you received the following: Venofer.      BELOW ARE SYMPTOMS THAT SHOULD BE REPORTED IMMEDIATELY: *FEVER GREATER THAN 100.4 F (38 C) OR HIGHER *CHILLS OR SWEATING *NAUSEA AND VOMITING THAT IS NOT CONTROLLED WITH YOUR NAUSEA MEDICATION *UNUSUAL SHORTNESS OF BREATH *UNUSUAL BRUISING OR BLEEDING *URINARY PROBLEMS (pain or burning when urinating, or frequent urination) *BOWEL PROBLEMS (unusual diarrhea, constipation, pain near the anus) TENDERNESS IN MOUTH AND THROAT WITH OR WITHOUT PRESENCE OF ULCERS (sore throat, sores in mouth, or a toothache) UNUSUAL RASH, SWELLING OR PAIN  UNUSUAL VAGINAL DISCHARGE OR ITCHING   Items with * indicate a potential emergency and should be followed up as soon as possible or go to the Emergency Department if any problems should occur.  Should you have questions after your visit or need to cancel or reschedule your appointment, please contact [facility] [place] [facility]  [phone] and follow the prompts.  Office hours are 8:00 a.m. to 4:30 p.m. Monday - Friday. Please note that voicemails left after 4:00 p.m. may not be returned until the following  business day.  We are closed weekends and major holidays. You have access to a nurse at all times for urgent questions. Please call the main number to the clinic [phone] and follow the prompts.  For any non-urgent questions, you may also contact your provider using MyChart. We now offer e-Visits for anyone 18 and older to request care online for non-urgent symptoms. For details visit [email or web address].   Also download the MyChart app! Go to the app store, search "MyChart", open the app, select , and log in with your MyChart username and password.  Due to Covid, a mask is required upon entering the hospital/clinic. If you do not have a mask, one will be given to you upon arrival. For doctor visits, patients may have 1 support person aged 18 or older with them. For treatment visits, patients cannot have anyone with them due to current Covid guidelines and our immunocompromised population.  

## 2021-02-18 ENCOUNTER — Other Ambulatory Visit (HOSPITAL_COMMUNITY): Payer: Self-pay

## 2021-02-19 DIAGNOSIS — E119 Type 2 diabetes mellitus without complications: Secondary | ICD-10-CM | POA: Diagnosis not present

## 2021-02-19 LAB — HM DIABETES EYE EXAM

## 2021-02-19 NOTE — Progress Notes (Signed)
Today had a home visit with Debra Saunders.  She is doing good she states.  She is running low on opsumit and uptravi.  She states will be delivered today and tomorrow.  We tracked it and does say will be delivered.  Called in refill for gabapentin.  Filled her med boxes for 2 weeks.  She has all her other meds.  She denies any chest pain, increased shortness of breath, or dizziness.  She has a headache today, she states coming from her neck pain.  She states appetite is good.  She has everything she needs for daily living.  Advised her to let me know if meds does not show.  She states has scheduled a pulmonology appt.  Will continue to visit for heart failure, diet and medication management.   [place] [phone]

## 2021-02-21 ENCOUNTER — Encounter (HOSPITAL_COMMUNITY): Payer: Self-pay

## 2021-02-21 NOTE — Progress Notes (Signed)
[name] has not been delivered by [facility].  Shows it was shipped on 25th and it was overnight shipment.  Still showing delayed in [place].  Contacted pharmacy and they are shipping more overnight by [employer], [place] should have in the morning.  She is taking her last today, she should not skip any dosages if she receives it.   [place] [phone]

## 2021-02-26 ENCOUNTER — Ambulatory Visit (INDEPENDENT_AMBULATORY_CARE_PROVIDER_SITE_OTHER): Payer: Medicare HMO

## 2021-02-26 DIAGNOSIS — I428 Other cardiomyopathies: Secondary | ICD-10-CM | POA: Diagnosis not present

## 2021-02-27 LAB — CUP PACEART REMOTE DEVICE CHECK
Battery Remaining Longevity: 66 mo
Battery Remaining Percentage: 67 %
Brady Statistic RA Percent Paced: 65 %
Brady Statistic RV Percent Paced: 99 %
Date Time Interrogation Session: 20220907130600
Implantable Lead Implant Date: 20150720
Implantable Lead Implant Date: 20150720
Implantable Lead Location: 753859
Implantable Lead Location: 753860
Implantable Lead Model: 5076
Implantable Lead Model: 5076
Implantable Pulse Generator Implant Date: 20150720
Lead Channel Impedance Value: 362 Ohm
Lead Channel Impedance Value: 557 Ohm
Lead Channel Pacing Threshold Amplitude: 0.8 V
Lead Channel Pacing Threshold Pulse Width: 0.4 ms
Lead Channel Setting Pacing Amplitude: 2 V
Lead Channel Setting Pacing Amplitude: 2.4 V
Lead Channel Setting Pacing Pulse Width: 0.4 ms
Lead Channel Setting Sensing Sensitivity: 2.5 mV
Pulse Gen Serial Number: 390330

## 2021-03-06 NOTE — Progress Notes (Signed)
Remote pacemaker transmission.   

## 2021-03-20 DIAGNOSIS — K529 Noninfective gastroenteritis and colitis, unspecified: Secondary | ICD-10-CM | POA: Diagnosis not present

## 2021-03-21 ENCOUNTER — Other Ambulatory Visit (HOSPITAL_COMMUNITY): Payer: Self-pay

## 2021-03-21 ENCOUNTER — Other Ambulatory Visit: Payer: Self-pay | Admitting: Cardiovascular Disease

## 2021-03-21 ENCOUNTER — Encounter (HOSPITAL_COMMUNITY): Payer: Self-pay

## 2021-03-21 NOTE — Progress Notes (Signed)
Today had a home visit with [name].  She is always pleasant to visit with.  She has everything for daily living.  She is having pain is side of chest and grunts when move.  She has not advised her PCP about this pain and advised her to contact them.  Refilled her med box.  She is running out of opsumit and eliquis.  Called in refills for both.  Opsumit is suppose to be delivered on Monday, asked to let me know if dont arrive.  She denies any chest pain, headaches, dizziness or increased shortness of breath.  She wears her oxygen 24/7.  She does have trouble going to sleep every night.  She gets up late in the day.  She takes her morning meds around 1:00 every day.  She lives alone and has lots of family support close by.  She is able to cook and do light house cleaning.  She is aware of up coming appts.  She takes her own vitals daily.  She watches high sodium foods and watches how much liquids she intakes daily.  Will continue to visit for heart failure, diet and medication management.   [employer] [phone]

## 2021-03-21 NOTE — Telephone Encounter (Signed)
Refill request

## 2021-03-22 NOTE — Telephone Encounter (Signed)
Refill request

## 2021-03-22 NOTE — Telephone Encounter (Signed)
Prescription refill request for Eliquis received. Indication: Afib  Last office visit: Sharolyn Douglas, 06/13/2020 Scr: 0.99, 12/25/2020 Age: 75 yo  Weight: 88.5 kg   Refill sent.

## 2021-03-27 ENCOUNTER — Other Ambulatory Visit (HOSPITAL_COMMUNITY): Payer: Self-pay

## 2021-03-27 ENCOUNTER — Other Ambulatory Visit: Payer: Self-pay

## 2021-03-27 ENCOUNTER — Encounter (HOSPITAL_COMMUNITY): Payer: Self-pay | Admitting: Cardiology

## 2021-03-27 ENCOUNTER — Ambulatory Visit (HOSPITAL_COMMUNITY)
Admission: RE | Admit: 2021-03-27 | Discharge: 2021-03-27 | Disposition: A | Discharge status: Home / Self Care | Payer: Medicare HMO | Source: Ambulatory Visit | Attending: Cardiology | Admitting: Cardiology

## 2021-03-27 VITALS — BP 130/62 | HR 60 | Wt 195.6 lb

## 2021-03-27 DIAGNOSIS — Z950 Presence of cardiac pacemaker: Secondary | ICD-10-CM | POA: Diagnosis not present

## 2021-03-27 DIAGNOSIS — I2721 Secondary pulmonary arterial hypertension: Secondary | ICD-10-CM

## 2021-03-27 DIAGNOSIS — I130 Hypertensive heart and chronic kidney disease with heart failure and stage 1 through stage 4 chronic kidney disease, or unspecified chronic kidney disease: Secondary | ICD-10-CM | POA: Insufficient documentation

## 2021-03-27 DIAGNOSIS — Z8673 Personal history of transient ischemic attack (TIA), and cerebral infarction without residual deficits: Secondary | ICD-10-CM | POA: Diagnosis not present

## 2021-03-27 DIAGNOSIS — Z8249 Family history of ischemic heart disease and other diseases of the circulatory system: Secondary | ICD-10-CM | POA: Diagnosis not present

## 2021-03-27 DIAGNOSIS — R001 Bradycardia, unspecified: Secondary | ICD-10-CM | POA: Diagnosis not present

## 2021-03-27 DIAGNOSIS — J9611 Chronic respiratory failure with hypoxia: Secondary | ICD-10-CM | POA: Diagnosis not present

## 2021-03-27 DIAGNOSIS — G4733 Obstructive sleep apnea (adult) (pediatric): Secondary | ICD-10-CM | POA: Diagnosis not present

## 2021-03-27 DIAGNOSIS — E1122 Type 2 diabetes mellitus with diabetic chronic kidney disease: Secondary | ICD-10-CM | POA: Insufficient documentation

## 2021-03-27 DIAGNOSIS — J849 Interstitial pulmonary disease, unspecified: Secondary | ICD-10-CM | POA: Diagnosis not present

## 2021-03-27 DIAGNOSIS — Z7901 Long term (current) use of anticoagulants: Secondary | ICD-10-CM | POA: Diagnosis not present

## 2021-03-27 DIAGNOSIS — I5022 Chronic systolic (congestive) heart failure: Secondary | ICD-10-CM | POA: Diagnosis not present

## 2021-03-27 DIAGNOSIS — I480 Paroxysmal atrial fibrillation: Secondary | ICD-10-CM | POA: Insufficient documentation

## 2021-03-27 DIAGNOSIS — Z87891 Personal history of nicotine dependence: Secondary | ICD-10-CM | POA: Insufficient documentation

## 2021-03-27 DIAGNOSIS — I272 Pulmonary hypertension, unspecified: Secondary | ICD-10-CM | POA: Diagnosis not present

## 2021-03-27 DIAGNOSIS — Z886 Allergy status to analgesic agent status: Secondary | ICD-10-CM | POA: Diagnosis not present

## 2021-03-27 DIAGNOSIS — Z86718 Personal history of other venous thrombosis and embolism: Secondary | ICD-10-CM | POA: Insufficient documentation

## 2021-03-27 DIAGNOSIS — Z9981 Dependence on supplemental oxygen: Secondary | ICD-10-CM | POA: Diagnosis not present

## 2021-03-27 DIAGNOSIS — J449 Chronic obstructive pulmonary disease, unspecified: Secondary | ICD-10-CM | POA: Insufficient documentation

## 2021-03-27 DIAGNOSIS — I5032 Chronic diastolic (congestive) heart failure: Secondary | ICD-10-CM | POA: Diagnosis not present

## 2021-03-27 DIAGNOSIS — Z79899 Other long term (current) drug therapy: Secondary | ICD-10-CM | POA: Diagnosis not present

## 2021-03-27 DIAGNOSIS — Z880 Allergy status to penicillin: Secondary | ICD-10-CM | POA: Diagnosis not present

## 2021-03-27 DIAGNOSIS — I2510 Atherosclerotic heart disease of native coronary artery without angina pectoris: Secondary | ICD-10-CM | POA: Diagnosis not present

## 2021-03-27 DIAGNOSIS — J8410 Pulmonary fibrosis, unspecified: Secondary | ICD-10-CM | POA: Diagnosis not present

## 2021-03-27 DIAGNOSIS — N183 Chronic kidney disease, stage 3 unspecified: Secondary | ICD-10-CM | POA: Diagnosis not present

## 2021-03-27 LAB — BASIC METABOLIC PANEL
Anion gap: 8 (ref 5–15)
BUN: 27 mg/dL — ABNORMAL HIGH (ref 8–23)
CO2: 29 mmol/L (ref 22–32)
Calcium: 8.8 mg/dL — ABNORMAL LOW (ref 8.9–10.3)
Chloride: 102 mmol/L (ref 98–111)
Creatinine, Ser: 1.14 mg/dL — ABNORMAL HIGH (ref 0.44–1.00)
GFR, Estimated: 50 mL/min — ABNORMAL LOW (ref >60)
Glucose, Bld: 116 mg/dL — ABNORMAL HIGH (ref 70–99)
Potassium: 4.2 mmol/L (ref 3.5–5.1)
Sodium: 139 mmol/L (ref 135–145)

## 2021-03-27 LAB — DIGOXIN LEVEL: Digoxin Level: 0.6 ng/mL — ABNORMAL LOW (ref 0.8–2.0)

## 2021-03-27 LAB — BRAIN NATRIURETIC PEPTIDE: B Natriuretic Peptide: 31.1 pg/mL (ref 0.0–100.0)

## 2021-03-27 NOTE — Patient Instructions (Signed)
EKG done today.  6 minute walk test done today.   Labs done today. We will contact you only if your labs are abnormal.  No medication changes were made. Please continue all current medications as prescribed.  Your physician recommends that you schedule a follow-up appointment soon for an echo(done at [facility]) and in 3 months with Dr. [name]. Please contact our office in December to schedule a January 2023 appointment.   If you have any questions or concerns before your next appointment please send [place] a message through [place] or call our office at [phone].    TO LEAVE A MESSAGE FOR THE NURSE SELECT OPTION 2, PLEASE LEAVE A MESSAGE INCLUDING: YOUR NAME DATE OF BIRTH CALL BACK NUMBER REASON FOR CALL**this is important as we prioritize the call backs  YOU WILL RECEIVE A CALL BACK THE SAME DAY AS LONG AS YOU CALL BEFORE 4:00 PM   Do the following things EVERYDAY: Weigh yourself in the morning before breakfast. Write it down and keep it in a log. Take your medicines as prescribed Eat low salt foods--Limit salt (sodium) to 2000 mg per day.  Stay as active as you can everyday Limit all fluids for the day to less than 2 liters   At the [facility], you and your health needs are our priority. As part of our continuing mission to provide you with exceptional heart care, we have created designated Provider Care Teams. These Care Teams include your primary Cardiologist (physician) and Advanced Practice Providers (APPs- Physician Assistants and Nurse Practitioners) who all work together to provide you with the care you need, when you need it.   You may see any of the following providers on your designated Care Team at your next follow up: Dr Glori Bickers Dr Haynes Kerns, NP Lyda Jester, [place] Audry Riles, PharmD   Please be sure to bring in all your medications bottles to every appointment.

## 2021-03-27 NOTE — Progress Notes (Signed)
6 Min Walk Test Completed  Pt ambulated 228.6 meters  O2 Sat ranged 97%-100% on 4L oxygen HR ranged 70-78

## 2021-03-27 NOTE — Progress Notes (Signed)
Medication Samples have been provided to the patient.  Drug name: Eliquis       Strength: 5mg         Qty: 4  LOT: [id number]  Exp.Date: 01/2023  Dosing instructions: Take 1 tab po bid  The patient has been instructed regarding the correct time, dose, and frequency of taking this medication, including desired effects and most common side effects.   Debra Saunders Name 4:79 PM 03/27/2021

## 2021-03-28 NOTE — Progress Notes (Signed)
Date:  03/28/2021   ID:  [name], DOB 05/31/1946, MRN [id number]   Provider location: [place] [facility] Type of Visit: Established patient   PCP:  [name], MD  Cardiologist:  [name], MD Primary HF: Dr. [name]  Chief Complaint: Shortness of breath   History of Present Illness: NORMAN BIER is a 75 y.o. 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   Admitted 10/24-10/29/19 with acute on chronic diastolic HF. HF team consulted with concerns for low output. PICC was placed and milrinone to support RV function was started along with IV Lasix to facilitate diuresis. RHC done to evaluate PAH (see below). V/Q scan negative. High res CT showed pulmonary fibrosis. Pulmonary consult and recommended stopping amiodarone. She was transitioned to torsemide 60 mg BID. DC weight: 226 lbs.    She has started on selexipag for pulmonary hypertension in addition to Opsumit and Revatio.      Echo in 3/21 showed EF 60-65%, normal RV, PASP 50 mmHg, moderate-severe MR.   Had Zarephath 08/30/2020- low/normal filling pressures, mild pumonary htn, and preserved cardiac. output.   Today she returns for HF follow up.  She walks with a walker. Short of breath after about 200 feet.  Chronic left leg lymphedema, no change.  No orthopnea/PND.  No lightheadedness.  She has had central chest pain that is reproducible with palpation.  Weight down 3 lbs.  6 minute walk (1/20): 98 m 6 minute walk (7/20): 213 m 6 minute walk (3/22): 198 m 6 minute walk (10/22): 229 m   Labs (11/19): digoxin 0.8 Labs (1/20): K 4.2, creatinine 1.36 => 1.33, digoxin 0.8 Labs (2/20):  digoxin 0.2, K 4.3, creatinine 1.32 Labs (5/20): K 4.5, creatinine 1.4, digoxin 0.8 Labs (1/22): K 4, creatinine 1.07 Labs (8/22): K 4.1, creatinine 1.03  ECG (personally reviewed): A-V dual paced   PMH: 1. Lymphedema: Left lower leg (chronic).  2. COPD 3. 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  - PFTs (2017) with FEV1 64%, FEV1/FVC ratio 106%, TLC 49%, DLCO 39% (restrictive).  - CT chest high resolution (10/19): Pulmonary parenchymal pattern of fibrosis.  - CT chest (4/22): Chronic ILD 4. Atrial fibrillation: Paroxysmal.  She is off amiodarone due to lung disease.  - 2/19 DCCV.  5. H/o DVT s/p IVC filter in 2004.  6. CAD: 4/19 coronary angiography with minimal coronary disease.  7. Bradycardia: [employer] PPM.  8. H/o traumatic SAH 2014.  9. CKD: Stage 3.  10. H/o CVA 11. OSA: CPAP 12. HTN 13. Type II diabetes 14. Pulmonary hypertension:  - PFTs 2017 with severe restriction.  - Echo (10/19): EF 60-65%, PASP 52 mmHg - RHC (4/19): Severe PAH with PVR 7.2 WU.  - RHC (10/19): mean RA 6, PA 56/17 mean 33, PCWP mean 12, CI 3.23, PVR 3.4 WU.  - RHC (3/22): mean 3, RV 30/1, PA 33/11, mean 20, PCWP mean 6, Oxygen saturations: PA 69% AO 100%, Cardiac Output (Fick) 6.18, Cardiac Index (Fick) 3.36, and PVR 2.3 WU - V/Q scan (10/19): No chronic PE.  - Anti-SCL 70 and ANA negative.  ACE normal.  RF slightly elevated at 15.  - Echo (  3/21): EF 60-65%, normal RV, PASP 50 mmHg, moderate-severe MR. FH: mother with CHF 15. 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  16. Mitral regurgitation: moderate to severe on 3/22 echo.  17. Zio patch (3/22): 9.3% PACs, no AF, 2.9% PVCs  Current Outpatient Medications  Medication Sig Dispense Refill   acetaminophen (TYLENOL) 325 MG tablet Take 325-650 mg by mouth every 6 (six) hours as needed for mild pain. Reported on 10/10/2015     albuterol (ACCUNEB) 0.63 MG/3ML nebulizer solution Take 1 ampule by nebulization  every 6 (six) hours as needed for wheezing.     apixaban (ELIQUIS) 5 MG TABS tablet TAKE 1 TABLET BY MOUTH TWICE DAILY . APPOINTMENT REQUIRED FOR FUTURE REFILLS 60 tablet 5   cholestyramine (QUESTRAN) 4 GM/DOSE powder Take 4 g by mouth daily.     digoxin (LANOXIN) 0.125 MG tablet Take 1 tablet (125 mcg total) by mouth daily. 90 tablet 3   DUREZOL 0.05 % EMUL      gabapentin (NEURONTIN) 100 MG capsule Take 2 capsules (200 mg total) by mouth 3 (three) times daily. 540 capsule 1   KLOR-CON M20 20 MEQ tablet Take 1 tablet by mouth once daily 90 tablet 3   Lancets MISC Use with True Metrix glucometer to check sugars BID 200 each 3   macitentan (OPSUMIT) 10 MG tablet Take 1 tablet (10 mg total) by mouth daily. 30 tablet 11   meclizine (ANTIVERT) 25 MG tablet Take 1 tablet (25 mg total) by mouth 3 (three) times daily as needed for dizziness. 30 tablet 1   Menthol, Topical Analgesic, (BIOFREEZE EX) Apply 1 application topically 4 (four) times daily as needed (for pain).     ondansetron (ZOFRAN) 8 MG tablet Take 1 tablet (8 mg total) by mouth every 8 (eight) hours as needed for nausea or vomiting. 30 tablet 2   OXYGEN Inhale 4 L into the lungs continuous.     rosuvastatin (CRESTOR) 10 MG tablet Take 1 tablet (10 mg total) by mouth daily. 90 tablet 3   Selexipag (UPTRAVI) 1600 MCG TABS Take 1,600 mcg by mouth 2 (two) times daily.     sildenafil (REVATIO) 20 MG tablet Take 1 tablet (20 mg total) by mouth 3 (three) times daily. 270 tablet 3   spironolactone (ALDACTONE) 25 MG tablet TAKE 1 TABLET BY MOUTH AT BEDTIME.  PLEASE CALL FOR OFFICE VISIT [phone] 90 tablet 3   torsemide (DEMADEX) 20 MG tablet Take 40 mg by mouth 2 (two) times daily.     TRUE METRIX BLOOD GLUCOSE TEST test strip CHECK BLOOD GLUCOSE TWICE DAILY 200 strip 3   No current facility-administered medications for this encounter.    Allergies:   Aspirin, Other, and Penicillins   Social History:  The patient  reports that she quit  smoking about 30 years ago. Her smoking use included cigarettes. She has a 0.60 pack-year smoking history. She has never used smokeless tobacco. She reports that she does not drink alcohol and does not use drugs.   Family History:  The patient's family history includes Bone cancer in her sister; Breast cancer in her maternal aunt and sister; COPD in her mother; Heart disease in her mother; Hypertension in her father, mother, sister, and sister; Thyroid disease in her mother, sister, and sister.   ROS:  Please see the history of present illness.   All other systems are personally reviewed and negative.   Exam:   General: NAD Neck: No JVD, no thyromegaly or thyroid nodule.  Lungs: Clear to auscultation bilaterally with normal respiratory effort. CV: Nondisplaced PMI.  Heart regular S1/S2, no S3/S4, no murmur.  No peripheral edema.  No carotid bruit.  Normal pedal pulses.  Abdomen: Soft, nontender, no hepatosplenomegaly, no distention.  Skin: Intact without lesions or rashes.  Neurologic: Alert and oriented x 3.  Psych: Normal affect. Extremities: No clubbing or cyanosis.  HEENT: Normal.  MSK: Chest wall tender to palpation.   Recent Labs: 04/10/2020: TSH 0.98 10/24/2020: ALT 17 01/28/2021: Hemoglobin 9.4; Platelets 180 03/27/2021: B Natriuretic Peptide 31.1; BUN 27; Creatinine, Ser 1.14; Potassium 4.2; Sodium 139  Personally reviewed   Wt Readings from Last 3 Encounters:  03/27/21 88.7 kg (195 lb 9.6 oz)  03/21/21 88.5 kg (195 lb)  01/28/21 89.6 kg (197 lb 8 oz)    ASSESSMENT AND PLAN:  1. 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  Echo 10/19 showed EF 60-65% with moderate LVH, RV was difficult to see.  Echo in 3/21 with EF 60-65%, normal RV, PASP 50 mmHg, moderate-severe MR. During admission in 10/19, she required milrinone to support RV.  NYHA class II currently, not volume overloaded on exam.  Pulmonary fibrosis likely plays a  role in her dyspnea.     - Continue torsemide to 40 mg bid  - I will arrange for repeat echo.  2. Pulmonary hypertension: She was followed in the past at [facility].  Based on 4/19 cath, she had severe PAH with PVR 7.2 WU.  She has been on sildenafil 20 mg tid x 4 years. Chronic hypoxemic respiratory failure, on home oxygen.  She has a history of remote DVT (2004) but V/Q scan negative for evidence of chronic PE.  High resolution CT chest in 10/19 showed pulmonary fibrosis, and last PFTs in 2017 showed severe restriction.  Serological workup for rheumatological diseases was unremarkable.  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 Amiodarone was stopped given lung parenchymal disease. 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 Repeat RHC in 10/19 showed moderate pulmonary hypertension, PVR 3.4 WU which is significantly lower than on the 4/19 cath. RHC in 3/22 with normal PA pressure and PVR 2.3 WU.  - Continue sildenafil and Opsumit.   - Continue selexipag 1600 mcg bid, will not increase with normal PA pressure on 3/22 RHC.  - Continue digoxin for RV support, check digoxin level today.  - Continue CPAP and home oxygen.  - Continue followup with pulmonary for ILD, possible chronic hypersensitivity pneumonitis.  - 6 minute walk improved today.  3. CKD stage 3: BMET today.  4. Atrial fibrillation: Paroxysmal.  Off amiodarone with parenchymal lung disease. No palpitations.  She is a-paced today.  - Continue apixaban 5 mg bid.  5. OSA: Continue CPAP.  6. HTN: BP controlled.  7. 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      Signed, [name], MD  03/28/2021  [facility] [place] [address] [facility] and [place] [address] [phone] (office) [phone] (fax)

## 2021-04-01 ENCOUNTER — Other Ambulatory Visit: Payer: Self-pay

## 2021-04-01 ENCOUNTER — Ambulatory Visit (INDEPENDENT_AMBULATORY_CARE_PROVIDER_SITE_OTHER): Payer: Medicare HMO | Admitting: Family Medicine

## 2021-04-01 VITALS — BP 130/70 | HR 60 | Temp 98.5°F | Ht 60.0 in | Wt 198.4 lb

## 2021-04-01 DIAGNOSIS — Z23 Encounter for immunization: Secondary | ICD-10-CM | POA: Diagnosis not present

## 2021-04-01 DIAGNOSIS — G4733 Obstructive sleep apnea (adult) (pediatric): Secondary | ICD-10-CM | POA: Diagnosis not present

## 2021-04-01 DIAGNOSIS — E1122 Type 2 diabetes mellitus with diabetic chronic kidney disease: Secondary | ICD-10-CM | POA: Diagnosis not present

## 2021-04-01 DIAGNOSIS — I4819 Other persistent atrial fibrillation: Secondary | ICD-10-CM | POA: Diagnosis not present

## 2021-04-01 DIAGNOSIS — G629 Polyneuropathy, unspecified: Secondary | ICD-10-CM

## 2021-04-01 DIAGNOSIS — E785 Hyperlipidemia, unspecified: Secondary | ICD-10-CM | POA: Diagnosis not present

## 2021-04-01 DIAGNOSIS — M940 Chondrocostal junction syndrome [Tietze]: Secondary | ICD-10-CM

## 2021-04-01 MED ORDER — DULOXETINE HCL 30 MG PO CPEP: ORAL_CAPSULE | ORAL | 3 refills | Status: DC

## 2021-04-01 MED ORDER — GABAPENTIN 300 MG PO CAPS: 300.0000 mg | ORAL_CAPSULE | Freq: Three times a day (TID) | ORAL | 1 refills | Status: DC

## 2021-04-01 NOTE — Patient Instructions (Signed)
Nice to see you. We are going to try Cymbalta to see if that helps with your costochondritis pain. We are also going to increase your gabapentin to 300 mg 3 times a day. If you notice any sleeping issues, excessive drowsiness, increasing anxiety or depression, weight gain, or any other side effects with these changes please let us know.

## 2021-04-01 NOTE — Assessment & Plan Note (Signed)
Check A1c. 

## 2021-04-01 NOTE — Assessment & Plan Note (Signed)
She will continue Eliquis.  She will continue to see cardiology.  Advised if she develops more frequent palpitations or any persistent palpitation she should be evaluated.

## 2021-04-01 NOTE — Assessment & Plan Note (Signed)
Check lipid panel  

## 2021-04-01 NOTE — Progress Notes (Signed)
[name], MD Phone: [phone]  Debra Saunders is a 75 y.o. female who presents today for follow-up.  Costochondritis/musculoskeletal chest pain: This has been going on for months if not years.  Typically it is worse on the right side.  She had an ultrasound when she had a recent mammogram and it was felt that her tenderness was related to costochondritis.  She notes discomfort with deep breathing.  Notes it feels raw in the area when she drinks hot liquid.  Notes the area is pretty much painful all the time though gets worse with with activity.  Tylenol provides minimal benefit.  She does report a history of seizures.  She notes the gabapentin does not help with the musculoskeletal chest pain.  She has no dysphagia though does note after eating her right upper quadrant gets firm.  She has a bowel movement daily.  No nausea or vomiting.  Neuropathy: She is on gabapentin 200 mg 3 times daily.  It does not make her drowsy.  This does help with her neuropathy symptoms though she notes her nighttime neuropathy symptoms do not resolve all the way with the gabapentin.  OSA: Patient uses a CPAP though she reports its not working appropriately.  Notes the motor keeps cutting off.  She spoke with her pulmonologist about getting a new CPAP though the company advised it could be next year before she gets 1.  She called pulmonology earlier today to see if they would just send a new order to her current CPAP supplier.  Atrial fibrillation: She notes rare palpitations occurring once every 1 to 2 weeks.  No bleeding.  She is on digoxin.  She is also on Eliquis.  She denies any missed doses of Eliquis.  Social History   Tobacco Use  Smoking Status Former   Packs/day: 0.30   Years: 2.00   Pack years: 0.60   Types: Cigarettes   Quit date: 06/23/1990   Years since quitting: 30.7  Smokeless Tobacco Never    Current Outpatient Medications on File Prior to Visit  Medication Sig Dispense Refill    acetaminophen (TYLENOL) 325 MG tablet Take 325-650 mg by mouth every 6 (six) hours as needed for mild pain. Reported on 10/10/2015     albuterol (ACCUNEB) 0.63 MG/3ML nebulizer solution Take 1 ampule by nebulization every 6 (six) hours as needed for wheezing.     apixaban (ELIQUIS) 5 MG TABS tablet TAKE 1 TABLET BY MOUTH TWICE DAILY . APPOINTMENT REQUIRED FOR FUTURE REFILLS 60 tablet 5   cholestyramine (QUESTRAN) 4 GM/DOSE powder Take 4 g by mouth daily.     digoxin (LANOXIN) 0.125 MG tablet Take 1 tablet (125 mcg total) by mouth daily. 90 tablet 3   DUREZOL 0.05 % EMUL      KLOR-CON M20 20 MEQ tablet Take 1 tablet by mouth once daily 90 tablet 3   Lancets MISC Use with True Metrix glucometer to check sugars BID 200 each 3   macitentan (OPSUMIT) 10 MG tablet Take 1 tablet (10 mg total) by mouth daily. 30 tablet 11   meclizine (ANTIVERT) 25 MG tablet Take 1 tablet (25 mg total) by mouth 3 (three) times daily as needed for dizziness. 30 tablet 1   Menthol, Topical Analgesic, (BIOFREEZE EX) Apply 1 application topically 4 (four) times daily as needed (for pain).     ondansetron (ZOFRAN) 8 MG tablet Take 1 tablet (8 mg total) by mouth every 8 (eight) hours as needed for nausea or vomiting. 30 tablet  2   OXYGEN Inhale 4 L into the lungs continuous.     rosuvastatin (CRESTOR) 10 MG tablet Take 1 tablet (10 mg total) by mouth daily. 90 tablet 3   Selexipag (UPTRAVI) 1600 MCG TABS Take 1,600 mcg by mouth 2 (two) times daily.     sildenafil (REVATIO) 20 MG tablet Take 1 tablet (20 mg total) by mouth 3 (three) times daily. 270 tablet 3   spironolactone (ALDACTONE) 25 MG tablet TAKE 1 TABLET BY MOUTH AT BEDTIME.  PLEASE CALL FOR OFFICE VISIT [phone] 90 tablet 3   torsemide (DEMADEX) 20 MG tablet Take 40 mg by mouth 2 (two) times daily.     TRUE METRIX BLOOD GLUCOSE TEST test strip CHECK BLOOD GLUCOSE TWICE DAILY 200 strip 3   No current facility-administered medications on file prior to visit.      ROS see history of present illness  Objective  Physical Exam Vitals:   04/01/21 1341  BP: 130/70  Pulse: 60  Temp: 98.5 F (36.9 C)  SpO2: 91%    BP Readings from Last 3 Encounters:  04/01/21 130/70  03/27/21 130/62  02/15/21 (!) 120/56   Wt Readings from Last 3 Encounters:  04/01/21 198 lb 6.4 oz (90 kg)  03/27/21 195 lb 9.6 oz (88.7 kg)  03/21/21 195 lb (88.5 kg)    Physical Exam Constitutional:      General: She is not in acute distress.    Appearance: She is not diaphoretic.  Cardiovascular:     Rate and Rhythm: Normal rate and regular rhythm.     Heart sounds: Normal heart sounds.  Pulmonary:     Effort: Pulmonary effort is normal.     Breath sounds: Normal breath sounds.  Chest:     Comments: The patient is diffusely tender over her upper costochondral area Skin:    General: Skin is warm and dry.  Neurological:     Mental Status: She is alert.     Assessment/Plan: Please see individual problem list.  Problem List Items Addressed This Visit     Atrial fibrillation ([place]) - Primary (Chronic)    She will continue Eliquis.  She will continue to see cardiology.  Advised if she develops more frequent palpitations or any persistent palpitation she should be evaluated.      Costochondritis    This is a chronic ongoing issue.  She periodically has flares of this.  Discussed that management of this in her is somewhat difficult given her other medical issues as well as her other medications.  NSAIDs are not an option given that she is on Eliquis.  Given her prior seizure she cannot take tramadol.  Narcotics are not indicated given her chronic respiratory issues.  We will trial Cymbalta.  I advised that she monitor for weight gain, sleeping issues, anxiety, depression, or any other new symptoms with starting this medication.  We are also going to refer her to pain management to see if local injections would be worthwhile.      Relevant Medications    DULoxetine (CYMBALTA) 30 MG capsule   Other Relevant Orders   Ambulatory referral to Pain Clinic   Diabetes ([place])    Check A1c.      Relevant Orders   HgB A1c   Hyperlipidemia    Check lipid panel.      Relevant Orders   Lipid panel   Neuropathy    We will increase her gabapentin to 300 mg 3 times daily.  She will monitor  for drowsiness with this.      Relevant Medications   gabapentin (NEURONTIN) 300 MG capsule   OSA (obstructive sleep apnea)    She will follow-up with pulmonology to determine if she can use her current DME supplier.  If she has any issues with this she will let us know.      Other Visit Diagnoses     Need for immunization against influenza       Relevant Orders   Flu Vaccine QUAD High Dose(Fluad) (Completed)      Return in about 6 weeks (around 05/13/2021) for Costochondritis.  This visit occurred during the SARS-CoV-2 public health emergency.  Safety protocols were in place, including screening questions prior to the visit, additional usage of staff PPE, and extensive cleaning of exam room while observing appropriate contact time as indicated for disinfecting solutions.    [name], MD [place]

## 2021-04-01 NOTE — Assessment & Plan Note (Signed)
She will follow-up with pulmonology to determine if she can use her current DME supplier.  If she has any issues with this she will let us know.

## 2021-04-01 NOTE — Assessment & Plan Note (Signed)
This is a chronic ongoing issue.  She periodically has flares of this.  Discussed that management of this in her is somewhat difficult given her other medical issues as well as her other medications.  NSAIDs are not an option given that she is on Eliquis.  Given her prior seizure she cannot take tramadol.  Narcotics are not indicated given her chronic respiratory issues.  We will trial Cymbalta.  I advised that she monitor for weight gain, sleeping issues, anxiety, depression, or any other new symptoms with starting this medication.  We are also going to refer her to pain management to see if local injections would be worthwhile.

## 2021-04-01 NOTE — Assessment & Plan Note (Signed)
We will increase her gabapentin to 300 mg 3 times daily.  She will monitor for drowsiness with this.

## 2021-04-02 LAB — LIPID PANEL
Cholesterol: 130 mg/dL (ref 0–200)
HDL: 55.2 mg/dL (ref >39.00)
LDL Cholesterol: 42 mg/dL (ref 0–99)
NonHDL: 74.52
Total CHOL/HDL Ratio: 2
Triglycerides: 163 mg/dL — ABNORMAL HIGH (ref 0.0–149.0)
VLDL: 32.6 mg/dL (ref 0.0–40.0)

## 2021-04-02 LAB — HEMOGLOBIN A1C: Hgb A1c MFr Bld: 5.6 % (ref 4.6–6.5)

## 2021-04-04 ENCOUNTER — Other Ambulatory Visit (HOSPITAL_COMMUNITY): Payer: Self-pay

## 2021-04-04 ENCOUNTER — Encounter (HOSPITAL_COMMUNITY): Payer: Self-pay

## 2021-04-04 NOTE — Progress Notes (Signed)
Today had a home visit with [name].  She sates not feeling well since her flu shot, she states this is normal for her.  She has all her medications and called in refills for her sildenafil.  She states having problems affording her eliquis this month, she got samples from cardiology.  They advised her to get up with pharmacy for help.  Will reach out to them for her.  She advised Wynetta Emery and [facility] Emery has notified her that they will no longer help with meds starting the beginning of the year.  They are notifying her doctor also.  She states appetite has been ok, she had bout of diarrhea yesterday.  She states her underpants are expensive and hard to find.  She is calling [facility] to see if they can help.  She states up all night changing because of urinating.  Advised her to elevate in the daytime and see if she can urinate more during daytime.  She is sitting at the kitchen table til bed with feet down.  She denies any cardiac chest pain, dizziness or increased shortness of breath.  She has everything for daily living.   She wears oxygen 24 hrs a day.  She tries to watch high sodium foods and watches how much fluids.  Will continue to visit for heart failure, diet and medication compliance.   [place] [phone]

## 2021-04-18 ENCOUNTER — Other Ambulatory Visit (HOSPITAL_COMMUNITY): Payer: Self-pay

## 2021-04-18 NOTE — Progress Notes (Signed)
Today had a phone call with Debra Saunders.  She states she has been feeling bad lately.  She states she has been nauseated and has not been able to eat as much.  She has an eye infection and been keeping a headache.  Advised this could be causing it.  She states she might thought she had extra fluid so she took extra torsemide for 2 days.  She states she lost a little but still sick feeling.  She is down 4 lbs in 2 weeks.  Advised to see when the eye infection improves if the nausea improved.  Set up a home visit with her on next week.  She has an eye appt on Monday.  Contacted her pharmacy about her eliquis and they have switched her eliquis to 30 day instead of 90 day to make it affordable for her.  It is now 28.33 for 30 days.  She states will pick it up.  Will visit next week for heart failure, diet and medication compliance.   [place] [phone]

## 2021-04-19 ENCOUNTER — Other Ambulatory Visit (HOSPITAL_COMMUNITY): Payer: Self-pay | Admitting: *Deleted

## 2021-04-19 MED ORDER — UPTRAVI 1600 MCG PO TABS: 1600.0000 ug | ORAL_TABLET | Freq: Two times a day (BID) | ORAL | 11 refills | Status: DC

## 2021-04-22 DIAGNOSIS — H43813 Vitreous degeneration, bilateral: Secondary | ICD-10-CM | POA: Diagnosis not present

## 2021-04-23 ENCOUNTER — Encounter (HOSPITAL_COMMUNITY): Payer: Self-pay

## 2021-04-23 ENCOUNTER — Telehealth: Payer: Self-pay | Admitting: Family Medicine

## 2021-04-23 ENCOUNTER — Other Ambulatory Visit (HOSPITAL_COMMUNITY): Payer: Self-pay

## 2021-04-23 NOTE — Telephone Encounter (Signed)
Per Belia Heman from [employer] called in regarding Pt Debra Saunders medication. Marita Kansas stated that Dr. [name] prescribe Pt dulexetine. Marita Kansas stated that Pt chest pain has improved. Marita [place] would like to know do they need to decrease the Pt medication or stop the medication. Marita Kansas would like a callback.

## 2021-04-23 NOTE — Telephone Encounter (Signed)
Reviewed Dr. Ellen Henri last note. Called patient. She notes that about 3 days after starting duloxetine, costochondritis pain resolved. Denies any sedation, dizziness, mental status changes.   Advised to continue duloxetine 30 mg daily to prevent pain. She verbalized understanding. Notifying PCP.

## 2021-04-23 NOTE — Progress Notes (Signed)
Today had a home visit with Debra Saunders.  She states her chest pain has resolved.  She states been nauseated but no vomitus and diarrhea is better.  She is out of her Lucrezia Starch, contacted pharmacy and they will have it ready this evening.  She has been out 2 days.  She was wanting to stop the duloxetine since chest pain is gone, contacted her PCP on if she should or not.  They will advise her.  Called in refill for her spironolactone, will be ready tomorrow.  She has all her medication for her med boxes for 2 weeks.  Filled 2 weeks up for her.  Will contact pharmacy in [place] [facility] to start working on what we need to do with her uptravi and opsumit, starting in January the foundations she is on will no longer pay for them.  She has lots of complaints today, jaw pain, neck pain, headache, not sleeping and nausea.  She states jaw pain is normal and goes into the jaw from the uptravi.  CPAP is turning off and she has been on back order since beginning of year for a new one.  Contacted her pulmonology and she has appt for Monday next week to discuss new machine.  She states nausea is getting better but not sure what is causing it.  She states not eating as much, discussed what she has been eating.  She tries not to eat high sodium foods but sometimes she does.  She is drinking about 60 ounces a day and sometimes a little more and sometimes not enough.  She missed one dose of torsemide last week due to going out for appts.  She has lost a few lbs in past few weeks.  She is aware of up coming appts and she will get transportation scheduled by her daughter.  She denies any chest pain or increased shortness of breath.  She is trying to go to bed earlier but still sleeping til noon a lot of days.  She is sitting with feet down most of the day at her dining table.  She does do light house cleaning during the day, she is up and down.  She cooks her own food.  Will continue to visit for heart failure, diet and medication  compliance.   [place] [phone]

## 2021-04-24 NOTE — Telephone Encounter (Signed)
Agree with staying on the cymbalta 30 mg dose.

## 2021-04-29 ENCOUNTER — Other Ambulatory Visit: Payer: Self-pay

## 2021-04-29 ENCOUNTER — Inpatient Hospital Stay: Payer: Medicare HMO

## 2021-04-29 ENCOUNTER — Inpatient Hospital Stay: Payer: Medicare HMO | Admitting: Internal Medicine

## 2021-04-29 ENCOUNTER — Inpatient Hospital Stay: Payer: Medicare HMO | Attending: Internal Medicine

## 2021-04-29 VITALS — BP 133/64 | HR 59 | Temp 98.0°F | Resp 18

## 2021-04-29 DIAGNOSIS — R0609 Other forms of dyspnea: Secondary | ICD-10-CM | POA: Diagnosis not present

## 2021-04-29 DIAGNOSIS — E538 Deficiency of other specified B group vitamins: Secondary | ICD-10-CM | POA: Insufficient documentation

## 2021-04-29 DIAGNOSIS — G4733 Obstructive sleep apnea (adult) (pediatric): Secondary | ICD-10-CM | POA: Diagnosis not present

## 2021-04-29 DIAGNOSIS — E611 Iron deficiency: Secondary | ICD-10-CM

## 2021-04-29 DIAGNOSIS — Z7901 Long term (current) use of anticoagulants: Secondary | ICD-10-CM | POA: Diagnosis not present

## 2021-04-29 DIAGNOSIS — N183 Chronic kidney disease, stage 3 unspecified: Secondary | ICD-10-CM | POA: Insufficient documentation

## 2021-04-29 DIAGNOSIS — Z9989 Dependence on other enabling machines and devices: Secondary | ICD-10-CM | POA: Diagnosis not present

## 2021-04-29 DIAGNOSIS — I4891 Unspecified atrial fibrillation: Secondary | ICD-10-CM | POA: Diagnosis not present

## 2021-04-29 DIAGNOSIS — Z86711 Personal history of pulmonary embolism: Secondary | ICD-10-CM | POA: Insufficient documentation

## 2021-04-29 DIAGNOSIS — J449 Chronic obstructive pulmonary disease, unspecified: Secondary | ICD-10-CM | POA: Diagnosis not present

## 2021-04-29 DIAGNOSIS — D509 Iron deficiency anemia, unspecified: Secondary | ICD-10-CM | POA: Diagnosis not present

## 2021-04-29 LAB — BASIC METABOLIC PANEL
Anion gap: 7 (ref 5–15)
BUN: 27 mg/dL — ABNORMAL HIGH (ref 8–23)
CO2: 30 mmol/L (ref 22–32)
Calcium: 8.4 mg/dL — ABNORMAL LOW (ref 8.9–10.3)
Chloride: 98 mmol/L (ref 98–111)
Creatinine, Ser: 1.04 mg/dL — ABNORMAL HIGH (ref 0.44–1.00)
GFR, Estimated: 55 mL/min — ABNORMAL LOW (ref >60)
Glucose, Bld: 128 mg/dL — ABNORMAL HIGH (ref 70–99)
Potassium: 4 mmol/L (ref 3.5–5.1)
Sodium: 135 mmol/L (ref 135–145)

## 2021-04-29 LAB — CBC WITH DIFFERENTIAL/PLATELET
Abs Immature Granulocytes: 0.03 10*3/uL (ref 0.00–0.07)
Basophils Absolute: 0 10*3/uL (ref 0.0–0.1)
Basophils Relative: 0 %
Eosinophils Absolute: 0.2 10*3/uL (ref 0.0–0.5)
Eosinophils Relative: 3 %
HCT: 30.2 % — ABNORMAL LOW (ref 36.0–46.0)
Hemoglobin: 9 g/dL — ABNORMAL LOW (ref 12.0–15.0)
Immature Granulocytes: 0 %
Lymphocytes Relative: 25 %
Lymphs Abs: 1.7 10*3/uL (ref 0.7–4.0)
MCH: 27.4 pg (ref 26.0–34.0)
MCHC: 29.8 g/dL — ABNORMAL LOW (ref 30.0–36.0)
MCV: 91.8 fL (ref 80.0–100.0)
Monocytes Absolute: 0.4 10*3/uL (ref 0.1–1.0)
Monocytes Relative: 6 %
Neutro Abs: 4.4 10*3/uL (ref 1.7–7.7)
Neutrophils Relative %: 66 %
Platelets: 187 10*3/uL (ref 150–400)
RBC: 3.29 MIL/uL — ABNORMAL LOW (ref 3.87–5.11)
RDW: 12.3 % (ref 11.5–15.5)
WBC: 6.7 10*3/uL (ref 4.0–10.5)
nRBC: 0 % (ref 0.0–0.2)

## 2021-04-29 MED ORDER — CYANOCOBALAMIN 1000 MCG/ML IJ SOLN
1000.0000 ug | Freq: Once | INTRAMUSCULAR | Status: AC
  Administered 2021-04-29: 1000 ug via INTRAMUSCULAR
  Filled 2021-04-29: qty 1

## 2021-04-29 MED ORDER — IRON SUCROSE 20 MG/ML IV SOLN
200.0000 mg | Freq: Once | INTRAVENOUS | Status: AC
  Administered 2021-04-29: 200 mg via INTRAVENOUS
  Filled 2021-04-29: qty 10

## 2021-04-29 MED ORDER — SODIUM CHLORIDE 0.9 % IV SOLN
Freq: Once | INTRAVENOUS | Status: AC
  Filled 2021-04-29: qty 250

## 2021-04-29 NOTE — Progress Notes (Signed)
[place] NOTE  Patient Care Team: [name], MD as PCP - General (Family Medicine) Wellington Hampshire, MD as PCP - Cardiology (Cardiology) Deboraha Sprang, MD as PCP - Electrophysiology (Cardiology) Thom Chimes, MD as Referring Physician (Allergy) Juanito Doom, MD as Referring Physician (Internal Medicine) De Hollingshead, RPH-CPP as Pharmacist (Pharmacist) Cammie Sickle, MD as Consulting Physician (Hematology and Oncology) Jon Billings, RN as [place] [facility] Anemia CHIEF COMPLAINTS/PURPOSE OF CONSULTATION: Anemia   HEMATOLOGY HISTORY  # CHORNIC IRON DEFICIENCY ANEMIA [BL 10-11]-Jan today 21 PCP Hb-8.8; Antonieta Pert sat-5%; ferritin 11] EGD-; colonoscopy MARCH 2020 -polyp [[facility]-hold further work-up secondary to cardiac issues]; remote-EGD-?;  February 2021-LDH/haptoglobin normal; kappa lambda light chain ratio/M panel normal;  on Venofer; November 2020-CT scan question early cirrhosis/no splenomegaly.  No acute process; MARCH 2021- STOOL OCCULT x3- NEGATIVE.   #B12 deficiency-IM every 3 to 4 months  #2014 -question blood transfusion  [patient admitted for stroke]; CHF chronic -[ 2004 on 4 lits; Dr.Arida/Klein]; CT scan chest 2021 -no adenopathy; fibrotic changes; walker; CKD stage III-   #Incidental right axillary mass-s/p US biopsy [PCP] -negative for malignancy.  HISTORY OF PRESENTING ILLNESS:  Debra Saunders 75 y.o.  female anemia/likely secondary to ?CKD stage III is here for follow-up.  Patient continues to chronic fatigue.  No blood in stools [with no nausea vomiting. Patient continues with oxygen   Review of Systems  Constitutional:  Positive for malaise/fatigue. Negative for chills, diaphoresis, fever and weight loss.  HENT:  Negative for nosebleeds and sore throat.   Eyes:  Negative for double vision.  Respiratory:  Positive for shortness of breath. Negative for cough, hemoptysis, sputum  production and wheezing.   Cardiovascular:  Negative for chest pain, palpitations, orthopnea and leg swelling.  Gastrointestinal:  Positive for diarrhea. Negative for abdominal pain, blood in stool, constipation, heartburn, melena, nausea and vomiting.  Genitourinary:  Negative for dysuria, frequency and urgency.  Musculoskeletal:  Negative for back pain and joint pain.  Skin: Negative.  Negative for itching and rash.  Neurological:  Negative for dizziness, tingling, focal weakness, weakness and headaches.  Endo/Heme/Allergies:  Does not bruise/bleed easily.  Psychiatric/Behavioral:  Negative for depression. The patient is not nervous/anxious and does not have insomnia.    MEDICAL HISTORY:  Past Medical History:  Diagnosis Date   Arthritis    Atrial fibrillation, persistent ([place])    a. s/p TEE-guided DCCV on 08/20/2017   Cardiorenal syndrome    a. 09/2017 Creat rose to 2.7 w/ diuresis-->HD x 2.   Chest pain    a. 01/2012 Cath: nonobs dzs;  c. 04/2014 MV: No ischemia.   Chronic back pain    Chronic combined systolic (congestive) and diastolic (congestive) heart failure ([place])    a. Dx 2005 @ [facility];  b. 02/2014 Echo: EF 55-60%; c. 07/2017: EF 30-35%, diffuse HK, trivial AI, severe MR, moderate TR; d. 09/2017 Echo: EF 40-45%; e. 02/2018 Echo: EF 55-60%, Gr1 DD; f. 08/2019 Echo: EF 60-65%, Gr2 DD, Nl RV fxn, RVSP 49.75mmHg. Mod-Sev MR. Mild to mod TR. Mod PR. RA ~ 70mmHg.   Chronic respiratory failure (HCC)    a. on home O2   COPD (chronic obstructive pulmonary disease) ([place])    a. Home O2 - 3lpm   Gastritis    History of intracranial hemorrhage    a. 6/14 described by neurosurgery as "small frontal posttraumatic SAH " - pt was on xarelto @ the time.  Hyperlipidemia    Hypertension    Interstitial lung disease (HCC)    Lipoma of back    Lymphedema    a. Chronic LLE edema.   NICM (nonischemic cardiomyopathy) ([place])    a. 09/2017 Echo: EF 40-45%; b. 09/2017 Cath: minor, insignificant CAD; c. 02/2018  Echo: EF 55-60%, Gr1 DD; d. 08/2019 Echo: EF 60-65%, Gr2 DD.   Obesity    PAH (pulmonary artery hypertension) ([place])    a. 09/2017 Echo: PASP 94mmHg; b. 09/2017 RHC: RA 23, RV 84/13, PA 84/29, PCWP 20, CO 3.89, CI 1.89. LVEDP 18.   Presence of permanent cardiac pacemaker 01/09/2014   Sepsis with metabolic encephalopathy ([place])    Sleep apnea    Streptococcal bacteremia    Stroke [facility]) 2014   Subarachnoid hemorrhage ([place]) 01/19/2013   Symptomatic bradycardia    a. s/p BSX PPM.   Thyroid nodule    Type II diabetes mellitus ([place])    Urinary incontinence     SURGICAL HISTORY: Past Surgical History:  Procedure Laterality Date   ABDOMINAL HYSTERECTOMY  1969   BREAST BIOPSY Right 10/28/2019   lymph node bx, [place] Bx, pending path    CARDIAC CATHETERIZATION  2013   @ [place]: No obstructive CAD: Only 20% ostial left circumflex.   CARDIOVERSION N/A 10/12/2017   Procedure: CARDIOVERSION;  Surgeon: Minna Merritts, MD;  Location: [facility] ORS;  Service: Cardiovascular;  Laterality: N/A;   CATARACT EXTRACTION W/PHACO Right 09/19/2020   Procedure: CATARACT EXTRACTION PHACO AND INTRAOCULAR LENS PLACEMENT (IOC) RIGHT DIABETIC 3.95 00:43.0 9.2%;  Surgeon: Leandrew Koyanagi, MD;  Location: [place];  Service: Ophthalmology;  Laterality: Right;  Pt requests to be last sleep apnea   CATARACT EXTRACTION W/PHACO Left 10/03/2020   Procedure: CATARACT EXTRACTION PHACO AND INTRAOCULAR LENS PLACEMENT ([place]) LEFT DIABETIC;  Surgeon: Leandrew Koyanagi, MD;  Location: [place];  Service: Ophthalmology;  Laterality: Left;  3.25 0:59.3 5.5%   CHOLECYSTECTOMY     COLONOSCOPY WITH PROPOFOL N/A 09/10/2018   Procedure: COLONOSCOPY WITH PROPOFOL;  Surgeon: Manya Silvas, MD;  Location: [facility] ENDOSCOPY;  Service: Endoscopy;  Laterality: N/A;   CYST REMOVAL TRUNK  2002   BACK   DIALYSIS/PERMA CATHETER INSERTION N/A 10/06/2017   Procedure: DIALYSIS/PERMA CATHETER INSERTION;  Surgeon: Katha Cabal,  MD;  Location: [place] CV LAB;  Service: Cardiovascular;  Laterality: N/A;   INSERT / REPLACE / REMOVE PACEMAKER     lipoma removal  2014   back   PACEMAKER INSERTION  2015   [employer] dual chamber pacemaker implanted by Dr Caryl Comes for symptomatic bradycardia   PERMANENT PACEMAKER INSERTION N/A 01/09/2014   Procedure: PERMANENT PACEMAKER INSERTION;  Surgeon: Deboraha Sprang, MD;  Location: [facility] CATH LAB;  Service: Cardiovascular;  Laterality: N/A;   RIGHT HEART CATH N/A 04/19/2018   Procedure: RIGHT HEART CATH;  Surgeon: Larey Dresser, MD;  Location: [place] CV LAB;  Service: Cardiovascular;  Laterality: N/A;   RIGHT HEART CATH N/A 08/30/2020   Procedure: RIGHT HEART CATH;  Surgeon: Larey Dresser, MD;  Location: [place] CV LAB;  Service: Cardiovascular;  Laterality: N/A;   RIGHT/LEFT HEART CATH AND CORONARY ANGIOGRAPHY N/A 10/19/2017   Procedure: RIGHT/LEFT HEART CATH AND CORONARY ANGIOGRAPHY;  Surgeon: Wellington Hampshire, MD;  Location: [place] CV LAB;  Service: Cardiovascular;  Laterality: N/A;   TEE WITHOUT CARDIOVERSION N/A 08/20/2017   Procedure: TRANSESOPHAGEAL ECHOCARDIOGRAM (TEE) & Direct current cardioversion;  Surgeon: Minna Merritts, MD;  Location: [facility]  ORS;  Service: Cardiovascular;  Laterality: N/A;   TRIGGER FINGER RELEASE      SOCIAL HISTORY: Social History   Socioeconomic History   Marital status: Divorced    Spouse name: Not on file   Number of children: 4   Years of education: 12   Highest education level: High school graduate  Occupational History   Occupation: Retired- factory work    Comment: 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 Has 4 children. No pets.      Work - Restaurant manager, fast food, retired      Diet - regular      02/14/2019: reports has Meals On Wheels delivered to her home;       Remote hx of smoking > 30 years; No alcohol-    Social Determinants of Health   Financial Resource Strain: Low Risk    Difficulty of Paying Living Expenses: Not very hard  Food Insecurity: No Food Insecurity   Worried About Charity fundraiser in the Last Year: Never true   Ran Out of Food in the Last Year: Never true  Transportation Needs: No Transportation Needs   Lack of Transportation (Medical): No   Lack of Transportation (Non-Medical): No  Physical Activity: Not on file  Stress: No Stress Concern Present   Feeling of Stress : Only a little  Social Connections: Socially Isolated   Frequency of Communication with Friends and Family: More than three times a week   Frequency of Social Gatherings with Friends and Family: Once a week   Attends Religious Services: Never   Marine scientist or Organizations: No   Attends Music therapist: Never   Marital Status: Divorced  Human resources officer Violence: Not At Risk   Fear of Current or Ex-Partner: No   Emotionally Abused: No   Physically Abused: No   Sexually Abused: No    FAMILY HISTORY: Family History  Problem Relation Age of Onset   Heart disease Mother    Hypertension Mother    COPD Mother        was a smoker   Thyroid disease Mother    Hypertension Father    Hypertension Sister    Thyroid disease Sister    Breast cancer Sister    Hypertension Sister    Thyroid disease Sister    Bone cancer Sister    Breast cancer Maternal Aunt    Kidney disease Neg Hx    Bladder Cancer Neg Hx     ALLERGIES:  is allergic to aspirin, other, and penicillins.  MEDICATIONS:  Current Outpatient Medications  Medication Sig Dispense Refill    acetaminophen (TYLENOL) 325 MG tablet Take 325-650 mg by mouth every 6 (six) hours as needed for mild pain. Reported on 10/10/2015     albuterol (ACCUNEB) 0.63 MG/3ML nebulizer solution Take 1 ampule by nebulization every 6 (six) hours as needed for wheezing.     apixaban (ELIQUIS) 5 MG TABS tablet  TAKE 1 TABLET BY MOUTH TWICE DAILY . APPOINTMENT REQUIRED FOR FUTURE REFILLS 60 tablet 5   cholestyramine (QUESTRAN) 4 GM/DOSE powder Take 4 g by mouth daily.     digoxin (LANOXIN) 0.125 MG tablet Take 1 tablet (125 mcg total) by mouth daily. 90 tablet 3   DULoxetine (CYMBALTA) 30 MG capsule Take 1 capsule (30 mg total) by mouth daily for 14 days, THEN 2 capsules (60 mg total) daily. 60 capsule 3   gabapentin (NEURONTIN) 300 MG capsule Take 1 capsule (300 mg total) by mouth 3 (three) times daily. 270 capsule 1   KLOR-CON M20 20 MEQ tablet Take 1 tablet by mouth once daily 90 tablet 3   Lancets MISC Use with True Metrix glucometer to check sugars BID 200 each 3   macitentan (OPSUMIT) 10 MG tablet Take 1 tablet (10 mg total) by mouth daily. 30 tablet 11   meclizine (ANTIVERT) 25 MG tablet Take 1 tablet (25 mg total) by mouth 3 (three) times daily as needed for dizziness. 30 tablet 1   Menthol, Topical Analgesic, (BIOFREEZE EX) Apply 1 application topically 4 (four) times daily as needed (for pain).     ondansetron (ZOFRAN) 8 MG tablet Take 1 tablet (8 mg total) by mouth every 8 (eight) hours as needed for nausea or vomiting. 30 tablet 2   OXYGEN Inhale 4 L into the lungs continuous.     rosuvastatin (CRESTOR) 10 MG tablet Take 1 tablet (10 mg total) by mouth daily. 90 tablet 3   Selexipag (UPTRAVI) 1600 MCG TABS Take 1 tablet (1,600 mcg total) by mouth 2 (two) times daily. 60 tablet 11   sildenafil (REVATIO) 20 MG tablet Take 1 tablet (20 mg total) by mouth 3 (three) times daily. 270 tablet 3   spironolactone (ALDACTONE) 25 MG tablet TAKE 1 TABLET BY MOUTH AT BEDTIME.  PLEASE CALL FOR OFFICE VISIT  [phone] 90 tablet 3   torsemide (DEMADEX) 20 MG tablet Take 40 mg by mouth 2 (two) times daily.     TRUE METRIX BLOOD GLUCOSE TEST test strip CHECK BLOOD GLUCOSE TWICE DAILY 200 strip 3   DUREZOL 0.05 % EMUL      No current facility-administered medications for this visit.      PHYSICAL EXAMINATION:   Vitals:   04/29/21 1426  BP: 132/82  Pulse: 61  Resp: (!) 22  Temp: 98.2 F (36.8 C)  SpO2: 100%   Filed Weights   04/29/21 1426  Weight: 197 lb (89.4 kg)    Physical Exam Constitutional:      Comments: Patient in wheelchair.  Alone.  She is on O2 nasal cannula.  HENT:     Head: Normocephalic and atraumatic.     Mouth/Throat:     Pharynx: No oropharyngeal exudate.  Eyes:     Pupils: Pupils are equal, round, and reactive to light.  Cardiovascular:     Rate and Rhythm: Normal rate and regular rhythm.  Pulmonary:     Effort: No respiratory distress.     Breath sounds: No wheezing.     Comments: Decreased air entry bilaterally.  No wheeze or crackles. Abdominal:     General: Bowel sounds are normal. There is no distension.     Palpations: Abdomen is soft. There is no mass.     Tenderness: There is no abdominal tenderness. There is no guarding or rebound.  Musculoskeletal:        General: No tenderness. Normal range of motion.     Cervical back:  Normal range of motion and neck supple.  Skin:    General: Skin is warm.  Neurological:     Mental Status: She is alert and oriented to person, place, and time.  Psychiatric:        Mood and Affect: Affect normal.    LABORATORY DATA:  I have reviewed the data as listed Lab Results  Component Value Date   WBC 6.7 04/29/2021   HGB 9.0 (L) 04/29/2021   HCT 30.2 (L) 04/29/2021   MCV 91.8 04/29/2021   PLT 187 04/29/2021   Recent Labs    06/13/20 1621 06/25/20 1334 08/27/20 1257 08/30/20 1117 10/24/20 1455 12/25/20 1646 01/28/21 1256 03/27/21 1530  NA 140   < > 140   < > 140 140 138 139  K 4.0   < > 4.0   <  > 4.4 4.0 4.1 4.2  CL 102   < > 104  --  102 103 102 102  CO2 25   < > 27  --  30 33* 28 29  GLUCOSE 111*   < > 113*  --  104* 107* 130* 116*  BUN 23   < > 32*  --  27* 20 28* 27*  CREATININE 1.02*   < > 1.00   < > 0.95 0.99 1.03* 1.14*  CALCIUM 8.8   < > 8.6*  --  8.9 8.9 8.6* 8.8*  GFRNONAA 54*   < > 58*  --  >60 59* 56* 50*  GFRAA 62  --   --   --   --   --   --   --   PROT  --   --  7.4  --  7.7  --   --   --   ALBUMIN  --   --  3.8  --  3.9  --   --   --   AST  --   --  15  --  19  --   --   --   ALT  --   --  12  --  17  --   --   --   ALKPHOS  --   --  107  --  115  --   --   --   BILITOT  --   --  0.6  --  0.5  --   --   --   BILIDIR  --   --  0.1  --  <0.1  --   --   --   IBILI  --   --  0.5  --  NOT CALCULATED  --   --   --    < > = values in this interval not displayed.     No results found.   Iron deficiency # Chronic Anemia [BL10-11]-iron deficiency/? CKD stage III [today GFR >60] currently s/p Venofer. Hemoglobin today is 9.0 improved from around 8.  Proceed with Venofer today. If Hb  <8 -would recommend bone marrow; hold off for now.   # Etiology of anemia- ? Chronic diarrhea/malabsopbtion/CKD-July 2022--iron saturation 23%; [facility] 365.   #? CKD stage III-GFR 60- STABLE.   #History of DVT/s/p IVC filter; ?  A. Fib-Eliquis-STABLE>   #Borderline low B12-230; s/p B12 injection. B12 injection today.   # DISPOSITION: # venofer/b12 today # Follow up in 3 months-MD; labs-- cbc;bmp; possible Venofer / B12 injection- Dr.B  Cc; Tammi Klippel  All questions were answered. The patient knows to call the clinic with  any problems, questions or concerns.    Cammie Sickle, MD 04/29/2021 2:42 PM

## 2021-04-29 NOTE — Patient Instructions (Signed)

## 2021-04-29 NOTE — Assessment & Plan Note (Addendum)
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 CKD stage III [today GFR >60] currently s/p Venofer. Hemoglobin today is 9.0 improved from around 8.  Proceed with Venofer today. If Hb  <8 -would recommend bone marrow; hold off for now.   # Etiology of anemia- ? Chronic diarrhea/malabsopbtion/CKD-July 2022--iron saturation 23%; Ferrin 365.   #? CKD stage III-GFR 60- STABLE.   #History of DVT/s/p IVC filter; ?  A. Fib-Eliquis-STABLE>   #Borderline low B12-230; s/p B12 injection. B12 injection today.   # DISPOSITION: # venofer/b12 today # Follow up in 3 months-MD; labs-- cbc;bmp; possible Venofer / B12 injection- Dr.B  Cc; Tammi Klippel

## 2021-05-02 ENCOUNTER — Other Ambulatory Visit: Payer: Self-pay

## 2021-05-02 NOTE — Patient Instructions (Signed)
Patient Goals/Self-Care Activities: Patient will self administer medications as prescribed Patient will attend all scheduled provider appointments Patient will continue to perform ADL's independently Take blood pressure at least weekly at home about one hour after medication and record in a notebook to take to doctor's visits.  Remain as active as possible Limit salt intake.

## 2021-05-10 ENCOUNTER — Other Ambulatory Visit: Payer: Self-pay | Admitting: Family Medicine

## 2021-05-10 DIAGNOSIS — M940 Chondrocostal junction syndrome [Tietze]: Secondary | ICD-10-CM

## 2021-05-24 ENCOUNTER — Ambulatory Visit (INDEPENDENT_AMBULATORY_CARE_PROVIDER_SITE_OTHER): Payer: Medicare HMO | Admitting: Family Medicine

## 2021-05-24 ENCOUNTER — Other Ambulatory Visit: Payer: Self-pay

## 2021-05-24 ENCOUNTER — Encounter: Payer: Self-pay | Admitting: Family Medicine

## 2021-05-24 DIAGNOSIS — M940 Chondrocostal junction syndrome [Tietze]: Secondary | ICD-10-CM | POA: Diagnosis not present

## 2021-05-24 DIAGNOSIS — G629 Polyneuropathy, unspecified: Secondary | ICD-10-CM | POA: Diagnosis not present

## 2021-05-24 DIAGNOSIS — J069 Acute upper respiratory infection, unspecified: Secondary | ICD-10-CM | POA: Insufficient documentation

## 2021-05-24 MED ORDER — DULOXETINE HCL 30 MG PO CPEP: 60.0000 mg | ORAL_CAPSULE | Freq: Every day | ORAL | 2 refills | Status: DC

## 2021-05-24 MED ORDER — GABAPENTIN 300 MG PO CAPS: ORAL_CAPSULE | ORAL | 1 refills | Status: DC

## 2021-05-24 NOTE — Assessment & Plan Note (Signed)
No improvement.  Discussed increasing her nighttime dose of gabapentin to 600 mg and continuing with 300 mg in the morning and middle of the day.  She will monitor for drowsiness.  If not improving over the next several weeks she will let us know.

## 2021-05-24 NOTE — Assessment & Plan Note (Signed)
Significantly improved.  She will remain on Cymbalta 60 mg once daily.  Refill sent to pharmacy.

## 2021-05-24 NOTE — Progress Notes (Signed)
Virtual Visit via telephone Note  This visit type was conducted due to national recommendations for restrictions regarding the COVID-19 pandemic (e.g. social distancing).  This format is felt to be most appropriate for this patient at this time.  All issues noted in this document were discussed and addressed.  No physical exam was performed (except for noted visual exam findings with Video Visits).   I connected with Debra Saunders today at  1:15 PM EST by telephone and verified that I am speaking with the correct person using two identifiers. Location patient: home Location provider: home office Persons participating in the virtual visit: patient, provider  I discussed the limitations, risks, security and privacy concerns of performing an evaluation and management service by telephone and the availability of in person appointments. I also discussed with the patient that there may be a patient responsible charge related to this service. The patient expressed understanding and agreed to proceed.  Interactive audio and video telecommunications were attempted between this provider and patient, however failed, due to patient having technical difficulties OR patient did not have access to video capability.  We continued and completed visit with audio only.   Reason for visit: f/u  HPI: Costochondritis: Patient notes this completely resolved with starting the Cymbalta.  Now she only gets occasionally sore from this if she reaches up while doing housework several times in a row.  She is on Cymbalta 60 mg once daily.  She has not noticed any side effects.  Neuropathy: Patient reports her symptoms are stable.  She did not notice any benefit from going up on the gabapentin to 300 mg 3 times daily.  She notes no drowsiness with this.  This bothers her the most at night.  Upper respiratory infection: Patient notes symptoms started about 2 weeks ago.  She had a negative home COVID test.  She had fever for  a few days and felt lousy with achiness and fatigue.  She had mild cough.  She has progressively improved and feels quite a bit better though occasionally does still feel achy and tired.  She will lay down for a bit and recover quite well.  She has had some congestion though it is progressively improving.   ROS: See pertinent positives and negatives per HPI.  Past Medical History:  Diagnosis Date   Arthritis    Atrial fibrillation, persistent ([place])    a. s/p TEE-guided DCCV on 08/20/2017   Cardiorenal syndrome    a. 09/2017 Creat rose to 2.7 w/ diuresis-->HD x 2.   Chest pain    a. 01/2012 Cath: nonobs dzs;  c. 04/2014 MV: No ischemia.   Chronic back pain    Chronic combined systolic (congestive) and diastolic (congestive) heart failure ([place])    a. Dx 2005 @ [facility];  b. 02/2014 Echo: EF 55-60%; c. 07/2017: EF 30-35%, diffuse HK, trivial AI, severe MR, moderate TR; d. 09/2017 Echo: EF 40-45%; e. 02/2018 Echo: EF 55-60%, Gr1 DD; f. 08/2019 Echo: EF 60-65%, Gr2 DD, Nl RV fxn, RVSP 49.43mmHg. Mod-Sev MR. Mild to mod TR. Mod PR. RA ~ 32mmHg.   Chronic respiratory failure (HCC)    a. on home O2   COPD (chronic obstructive pulmonary disease) ([place])    a. Home O2 - 3lpm   Gastritis    History of intracranial hemorrhage    a. 6/14 described by neurosurgery as "small frontal posttraumatic SAH " - pt was on xarelto @ the time.   Hyperlipidemia    Hypertension  Interstitial lung disease (HCC)    Lipoma of back    Lymphedema    a. Chronic LLE edema.   NICM (nonischemic cardiomyopathy) ([place])    a. 09/2017 Echo: EF 40-45%; b. 09/2017 Cath: minor, insignificant CAD; c. 02/2018 Echo: EF 55-60%, Gr1 DD; d. 08/2019 Echo: EF 60-65%, Gr2 DD.   Obesity    PAH (pulmonary artery hypertension) ([place])    a. 09/2017 Echo: PASP 33mmHg; b. 09/2017 RHC: RA 23, RV 84/13, PA 84/29, PCWP 20, CO 3.89, CI 1.89. LVEDP 18.   Presence of permanent cardiac pacemaker 01/09/2014   Sepsis with metabolic encephalopathy ([name])    Sleep  apnea    Streptococcal bacteremia    Stroke [facility]) 2014   Subarachnoid hemorrhage ([place]) 01/19/2013   Symptomatic bradycardia    a. s/p BSX PPM.   Thyroid nodule    Type II diabetes mellitus ([place])    Urinary incontinence     Past Surgical History:  Procedure Laterality Date   ABDOMINAL HYSTERECTOMY  1969   BREAST BIOPSY Right 10/28/2019   lymph node bx, [place] Bx, pending path    CARDIAC CATHETERIZATION  2013   @ [facility]: No obstructive CAD: Only 20% ostial left circumflex.   CARDIOVERSION N/A 10/12/2017   Procedure: CARDIOVERSION;  Surgeon: Minna Merritts, MD;  Location: [facility] ORS;  Service: Cardiovascular;  Laterality: N/A;   CATARACT EXTRACTION W/PHACO Right 09/19/2020   Procedure: CATARACT EXTRACTION PHACO AND INTRAOCULAR LENS PLACEMENT (IOC) RIGHT DIABETIC 3.95 00:43.0 9.2%;  Surgeon: Leandrew Koyanagi, MD;  Location: [place];  Service: Ophthalmology;  Laterality: Right;  Pt requests to be last sleep apnea   CATARACT EXTRACTION W/PHACO Left 10/03/2020   Procedure: CATARACT EXTRACTION PHACO AND INTRAOCULAR LENS PLACEMENT ([name]) LEFT DIABETIC;  Surgeon: Leandrew Koyanagi, MD;  Location: [place];  Service: Ophthalmology;  Laterality: Left;  3.25 0:59.3 5.5%   CHOLECYSTECTOMY     COLONOSCOPY WITH PROPOFOL N/A 09/10/2018   Procedure: COLONOSCOPY WITH PROPOFOL;  Surgeon: Manya Silvas, MD;  Location: [facility] ENDOSCOPY;  Service: Endoscopy;  Laterality: N/A;   CYST REMOVAL TRUNK  2002   BACK   DIALYSIS/PERMA CATHETER INSERTION N/A 10/06/2017   Procedure: DIALYSIS/PERMA CATHETER INSERTION;  Surgeon: Katha Cabal, MD;  Location: [place] CV LAB;  Service: Cardiovascular;  Laterality: N/A;   INSERT / REPLACE / REMOVE PACEMAKER     lipoma removal  2014   back   PACEMAKER INSERTION  2015   [employer] dual chamber pacemaker implanted by Dr Caryl Comes for symptomatic bradycardia   PERMANENT PACEMAKER INSERTION N/A 01/09/2014   Procedure: PERMANENT PACEMAKER  INSERTION;  Surgeon: Deboraha Sprang, MD;  Location: [facility] CATH LAB;  Service: Cardiovascular;  Laterality: N/A;   RIGHT HEART CATH N/A 04/19/2018   Procedure: RIGHT HEART CATH;  Surgeon: Larey Dresser, MD;  Location: [place] CV LAB;  Service: Cardiovascular;  Laterality: N/A;   RIGHT HEART CATH N/A 08/30/2020   Procedure: RIGHT HEART CATH;  Surgeon: Larey Dresser, MD;  Location: [place] CV LAB;  Service: Cardiovascular;  Laterality: N/A;   RIGHT/LEFT HEART CATH AND CORONARY ANGIOGRAPHY N/A 10/19/2017   Procedure: RIGHT/LEFT HEART CATH AND CORONARY ANGIOGRAPHY;  Surgeon: Wellington Hampshire, MD;  Location: [place] CV LAB;  Service: Cardiovascular;  Laterality: N/A;   TEE WITHOUT CARDIOVERSION N/A 08/20/2017   Procedure: TRANSESOPHAGEAL ECHOCARDIOGRAM (TEE) & Direct current cardioversion;  Surgeon: Minna Merritts, MD;  Location: [facility] ORS;  Service: Cardiovascular;  Laterality: N/A;   TRIGGER  FINGER RELEASE      Family History  Problem Relation Age of Onset   Heart disease Mother    Hypertension Mother    COPD Mother        was a smoker   Thyroid disease Mother    Hypertension Father    Hypertension Sister    Thyroid disease Sister    Breast cancer Sister    Hypertension Sister    Thyroid disease Sister    Bone cancer Sister    Breast cancer Maternal Aunt    Kidney disease Neg Hx    Bladder Cancer Neg Hx     SOCIAL HX: Former smoker   Current Outpatient Medications:    acetaminophen (TYLENOL) 325 MG tablet, Take 325-650 mg by mouth every 6 (six) hours as needed for mild pain. Reported on 10/10/2015, Disp: , Rfl:    albuterol (ACCUNEB) 0.63 MG/3ML nebulizer solution, Take 1 ampule by nebulization every 6 (six) hours as needed for wheezing., Disp: , Rfl:    apixaban (ELIQUIS) 5 MG TABS tablet, TAKE 1 TABLET BY MOUTH TWICE DAILY . APPOINTMENT REQUIRED FOR FUTURE REFILLS, Disp: 60 tablet, Rfl: 5   cholestyramine (QUESTRAN) 4 GM/DOSE powder, Take 4 g by mouth daily., Disp:  , Rfl:    digoxin (LANOXIN) 0.125 MG tablet, Take 1 tablet (125 mcg total) by mouth daily., Disp: 90 tablet, Rfl: 3   DUREZOL 0.05 % EMUL, , Disp: , Rfl:    KLOR-CON M20 20 MEQ tablet, Take 1 tablet by mouth once daily, Disp: 90 tablet, Rfl: 3   Lancets MISC, Use with True Metrix glucometer to check sugars BID, Disp: 200 each, Rfl: 3   macitentan (OPSUMIT) 10 MG tablet, Take 1 tablet (10 mg total) by mouth daily., Disp: 30 tablet, Rfl: 11   meclizine (ANTIVERT) 25 MG tablet, Take 1 tablet (25 mg total) by mouth 3 (three) times daily as needed for dizziness., Disp: 30 tablet, Rfl: 1   Menthol, Topical Analgesic, (BIOFREEZE EX), Apply 1 application topically 4 (four) times daily as needed (for pain)., Disp: , Rfl:    ondansetron (ZOFRAN) 8 MG tablet, Take 1 tablet (8 mg total) by mouth every 8 (eight) hours as needed for nausea or vomiting., Disp: 30 tablet, Rfl: 2   OXYGEN, Inhale 4 L into the lungs continuous., Disp: , Rfl:    rosuvastatin (CRESTOR) 10 MG tablet, Take 1 tablet (10 mg total) by mouth daily., Disp: 90 tablet, Rfl: 3   Selexipag (UPTRAVI) 1600 MCG TABS, Take 1 tablet (1,600 mcg total) by mouth 2 (two) times daily., Disp: 60 tablet, Rfl: 11   sildenafil (REVATIO) 20 MG tablet, Take 1 tablet (20 mg total) by mouth 3 (three) times daily., Disp: 270 tablet, Rfl: 3   spironolactone (ALDACTONE) 25 MG tablet, TAKE 1 TABLET BY MOUTH AT BEDTIME.  PLEASE CALL FOR OFFICE VISIT [phone], Disp: 90 tablet, Rfl: 3   torsemide (DEMADEX) 20 MG tablet, Take 40 mg by mouth 2 (two) times daily., Disp: , Rfl:    TRUE METRIX BLOOD GLUCOSE TEST test strip, CHECK BLOOD GLUCOSE TWICE DAILY, Disp: 200 strip, Rfl: 3   DULoxetine (CYMBALTA) 30 MG capsule, Take 2 capsules (60 mg total) by mouth daily., Disp: 180 capsule, Rfl: 2   gabapentin (NEURONTIN) 300 MG capsule, Take 1 capsule (300 mg total) by mouth every morning AND 1 capsule (300 mg total) daily after lunch AND 2 capsules (600 mg total) at bedtime.,  Disp: 360 capsule, Rfl: 1  EXAM: This was  a telephone visit and thus no exam was completed  ASSESSMENT AND PLAN:  Discussed the following assessment and plan:  Problem List Items Addressed This Visit     Costochondritis    Significantly improved.  She will remain on Cymbalta 60 mg once daily.  Refill sent to pharmacy.      Relevant Medications   DULoxetine (CYMBALTA) 30 MG capsule   Neuropathy    No improvement.  Discussed increasing her nighttime dose of gabapentin to 600 mg and continuing with 300 mg in the morning and middle of the day.  She will monitor for drowsiness.  If not improving over the next several weeks she will let us know.      Relevant Medications   gabapentin (NEURONTIN) 300 MG capsule   Upper respiratory infection    I suspect the patient had the flu though it is certainly possible she could have had some other viral illness.  She is slowly progressively improving.  Discussed monitoring her symptoms and if she does not continue to improve or if she worsens she will let us know.       Return in about 3 months (around 08/22/2021) for neuropathy.   I discussed the assessment and treatment plan with the patient. The patient was provided an opportunity to ask questions and all were answered. The patient agreed with the plan and demonstrated an understanding of the instructions.   The patient was advised to call back or seek an in-person evaluation if the symptoms worsen or if the condition fails to improve as anticipated.  I provided 11 minutes of non-face-to-face time during this encounter.   [name], MD

## 2021-05-24 NOTE — Assessment & Plan Note (Signed)
I suspect the patient had the flu though it is certainly possible she could have had some other viral illness.  She is slowly progressively improving.  Discussed monitoring her symptoms and if she does not continue to improve or if she worsens she will let us know.

## 2021-05-27 ENCOUNTER — Telehealth (HOSPITAL_COMMUNITY): Payer: Self-pay | Admitting: Pharmacy Technician

## 2021-05-27 NOTE — Telephone Encounter (Signed)
Advanced Heart Failure Patient Advocate Encounter  Prior Authorization for Opsumit has been approved.    PA# [id number] Effective dates: 06/23/20 through 06/22/22  Charlann Boxer, CPhT

## 2021-05-27 NOTE — Telephone Encounter (Signed)
Patient Advocate Encounter   Received notification from [facility] that prior authorization for Opsumit is required.   PA submitted on CoverMyMeds Key [id number] Status is pending   Will continue to follow.

## 2021-05-28 ENCOUNTER — Ambulatory Visit (INDEPENDENT_AMBULATORY_CARE_PROVIDER_SITE_OTHER): Payer: Medicare HMO

## 2021-05-28 DIAGNOSIS — I428 Other cardiomyopathies: Secondary | ICD-10-CM | POA: Diagnosis not present

## 2021-05-30 LAB — CUP PACEART REMOTE DEVICE CHECK
Battery Remaining Longevity: 66 mo
Battery Remaining Percentage: 70 %
Brady Statistic RA Percent Paced: 66 %
Brady Statistic RV Percent Paced: 99 %
Date Time Interrogation Session: 20221208032600
Implantable Lead Implant Date: 20150720
Implantable Lead Implant Date: 20150720
Implantable Lead Location: 753859
Implantable Lead Location: 753860
Implantable Lead Model: 5076
Implantable Lead Model: 5076
Implantable Pulse Generator Implant Date: 20150720
Lead Channel Impedance Value: 388 Ohm
Lead Channel Impedance Value: 559 Ohm
Lead Channel Pacing Threshold Amplitude: 0.8 V
Lead Channel Pacing Threshold Pulse Width: 0.4 ms
Lead Channel Setting Pacing Amplitude: 2 V
Lead Channel Setting Pacing Amplitude: 2.4 V
Lead Channel Setting Pacing Pulse Width: 0.4 ms
Lead Channel Setting Sensing Sensitivity: 2.5 mV
Pulse Gen Serial Number: 390330

## 2021-06-05 ENCOUNTER — Telehealth (HOSPITAL_COMMUNITY): Payer: Self-pay | Admitting: Pharmacist

## 2021-06-05 NOTE — Progress Notes (Signed)
Remote pacemaker transmission.   

## 2021-06-05 NOTE — Telephone Encounter (Signed)
Advanced Heart Failure Patient Advocate Encounter  Prior Authorizations for sildenafil and [name]  have been approved.    Effective dates: 06/23/20 through 06/22/22  Audry Riles, PharmD, BCPS, BCCP, CPP Heart Failure Clinic Pharmacist [phone]

## 2021-06-05 NOTE — Telephone Encounter (Signed)
Patient Advocate Encounter   Received notification from [facility] that prior authorizations for sildenafil and [name] are required.   PA submitted on CoverMyMeds Keys [id number], [id number] Status is pending   Will continue to follow.   Audry Riles, PharmD, BCPS, BCCP, CPP Heart Failure Clinic Pharmacist [phone]

## 2021-06-06 ENCOUNTER — Other Ambulatory Visit: Payer: Self-pay | Admitting: Family Medicine

## 2021-06-06 ENCOUNTER — Encounter (HOSPITAL_COMMUNITY): Payer: Self-pay

## 2021-06-06 ENCOUNTER — Other Ambulatory Visit (HOSPITAL_COMMUNITY): Payer: Self-pay

## 2021-06-06 ENCOUNTER — Other Ambulatory Visit (HOSPITAL_COMMUNITY): Payer: Self-pay | Admitting: Cardiology

## 2021-06-06 DIAGNOSIS — E785 Hyperlipidemia, unspecified: Secondary | ICD-10-CM

## 2021-06-06 DIAGNOSIS — M940 Chondrocostal junction syndrome [Tietze]: Secondary | ICD-10-CM

## 2021-06-06 NOTE — Progress Notes (Signed)
Today had a home visit with Debra Saunders.  She states feeling much better.  She is up and trying to do some Christmas things around her home.  She states appetite is better.  She does not why she is not losing weight, she states eats less then she was.  She tries to watch high sodium foods and about how much she drinks.  She has family support close by.  She has a grandson that works from her home everyother week.  She has all her medications, called in refills for eliquis, rousevastatin and cymbalta.  Did inform her she was approved for help with uptravi and opsumit for next year.  She is appreciative of that.  She denies any problems today such as chest pain, headaches, dizziness or increased shortness of breath.  She wears oxygen 24/7.  Her cpap machine is still messed up and they want her to do another sleep study in January to get a new one.  Will continue to visit for heart failure, diet and medication management.   [place] [phone]

## 2021-06-07 DIAGNOSIS — H209 Unspecified iridocyclitis: Secondary | ICD-10-CM | POA: Diagnosis not present

## 2021-06-19 ENCOUNTER — Telehealth (HOSPITAL_COMMUNITY): Payer: Self-pay

## 2021-06-19 NOTE — Telephone Encounter (Signed)
Contacted [place] to discuss home visit tomorrow and how she was doing.  She states horrible, been in the bed past 3 to 4 days.  She states urinating a lot and having real bad diarrhea.  She states has no energy and feels bad.  She states she fought with her front door in the middle of night with the wind and thinks she caught a cold.  She is not complaining of any cold symptoms.  Advised her she has been sick for past month and not getting any better.  She states can not eat, just feels sick all the time. Advised her to contact her PCP, she asked if I have talked with her daughter and I advised her I have not.  She states she was going to call me.  I contacted her daughter and she is concerned with her.  Sylvie has told both of [place] she will go to [employer] clinic at [place] tomorrow by her other daughter.  Her PCP can not see her til March but she can get in with the NP next Wednesday, she states does not like her and can not wait that long.  Advised her she need to go somewhere and get checked, she agreed.  Her daughter states will let me know what happens tomorrow.  Also she states [facility] does not have her duloxetine ready, noticed in Epic that is was sent to [facility], contacted them and they advised will have it ready in 1 hour.  Her daughter states she will pick it up, she has been out for a couple of days.  Will continue to visit for heart failure, diet and medication compliance.   [place] [phone]

## 2021-06-24 ENCOUNTER — Other Ambulatory Visit: Payer: Self-pay

## 2021-06-24 ENCOUNTER — Encounter: Payer: Self-pay | Admitting: Internal Medicine

## 2021-06-24 ENCOUNTER — Emergency Department: Payer: Medicare HMO

## 2021-06-24 DIAGNOSIS — I495 Sick sinus syndrome: Secondary | ICD-10-CM | POA: Diagnosis present

## 2021-06-24 DIAGNOSIS — D62 Acute posthemorrhagic anemia: Secondary | ICD-10-CM | POA: Diagnosis not present

## 2021-06-24 DIAGNOSIS — J189 Pneumonia, unspecified organism: Secondary | ICD-10-CM | POA: Diagnosis present

## 2021-06-24 DIAGNOSIS — I10 Essential (primary) hypertension: Secondary | ICD-10-CM | POA: Diagnosis not present

## 2021-06-24 DIAGNOSIS — I2690 Septic pulmonary embolism without acute cor pulmonale: Secondary | ICD-10-CM | POA: Diagnosis not present

## 2021-06-24 DIAGNOSIS — D649 Anemia, unspecified: Secondary | ICD-10-CM | POA: Diagnosis not present

## 2021-06-24 DIAGNOSIS — I248 Other forms of acute ischemic heart disease: Secondary | ICD-10-CM | POA: Diagnosis present

## 2021-06-24 DIAGNOSIS — R6521 Severe sepsis with septic shock: Secondary | ICD-10-CM | POA: Diagnosis not present

## 2021-06-24 DIAGNOSIS — Z8673 Personal history of transient ischemic attack (TIA), and cerebral infarction without residual deficits: Secondary | ICD-10-CM

## 2021-06-24 DIAGNOSIS — R112 Nausea with vomiting, unspecified: Secondary | ICD-10-CM | POA: Diagnosis not present

## 2021-06-24 DIAGNOSIS — I4819 Other persistent atrial fibrillation: Secondary | ICD-10-CM | POA: Diagnosis present

## 2021-06-24 DIAGNOSIS — T827XXA Infection and inflammatory reaction due to other cardiac and vascular devices, implants and grafts, initial encounter: Principal | ICD-10-CM | POA: Diagnosis present

## 2021-06-24 DIAGNOSIS — N1832 Chronic kidney disease, stage 3b: Secondary | ICD-10-CM | POA: Diagnosis not present

## 2021-06-24 DIAGNOSIS — Z9841 Cataract extraction status, right eye: Secondary | ICD-10-CM

## 2021-06-24 DIAGNOSIS — J9621 Acute and chronic respiratory failure with hypoxia: Secondary | ICD-10-CM | POA: Diagnosis present

## 2021-06-24 DIAGNOSIS — I700 Atherosclerosis of aorta: Secondary | ICD-10-CM | POA: Diagnosis present

## 2021-06-24 DIAGNOSIS — I714 Abdominal aortic aneurysm, without rupture, unspecified: Secondary | ICD-10-CM | POA: Diagnosis present

## 2021-06-24 DIAGNOSIS — B961 Klebsiella pneumoniae [K. pneumoniae] as the cause of diseases classified elsewhere: Secondary | ICD-10-CM | POA: Diagnosis present

## 2021-06-24 DIAGNOSIS — A408 Other streptococcal sepsis: Secondary | ICD-10-CM | POA: Diagnosis present

## 2021-06-24 DIAGNOSIS — Z20822 Contact with and (suspected) exposure to covid-19: Secondary | ICD-10-CM | POA: Diagnosis present

## 2021-06-24 DIAGNOSIS — R1011 Right upper quadrant pain: Secondary | ICD-10-CM | POA: Diagnosis not present

## 2021-06-24 DIAGNOSIS — R6520 Severe sepsis without septic shock: Secondary | ICD-10-CM | POA: Diagnosis not present

## 2021-06-24 DIAGNOSIS — Z86718 Personal history of other venous thrombosis and embolism: Secondary | ICD-10-CM

## 2021-06-24 DIAGNOSIS — E1122 Type 2 diabetes mellitus with diabetic chronic kidney disease: Secondary | ICD-10-CM | POA: Diagnosis present

## 2021-06-24 DIAGNOSIS — Z79899 Other long term (current) drug therapy: Secondary | ICD-10-CM

## 2021-06-24 DIAGNOSIS — N1831 Chronic kidney disease, stage 3a: Secondary | ICD-10-CM | POA: Diagnosis present

## 2021-06-24 DIAGNOSIS — Z9842 Cataract extraction status, left eye: Secondary | ICD-10-CM

## 2021-06-24 DIAGNOSIS — Z880 Allergy status to penicillin: Secondary | ICD-10-CM

## 2021-06-24 DIAGNOSIS — N179 Acute kidney failure, unspecified: Secondary | ICD-10-CM | POA: Diagnosis not present

## 2021-06-24 DIAGNOSIS — R531 Weakness: Secondary | ICD-10-CM | POA: Diagnosis not present

## 2021-06-24 DIAGNOSIS — I5032 Chronic diastolic (congestive) heart failure: Secondary | ICD-10-CM | POA: Diagnosis present

## 2021-06-24 DIAGNOSIS — F32A Depression, unspecified: Secondary | ICD-10-CM | POA: Diagnosis present

## 2021-06-24 DIAGNOSIS — A409 Streptococcal sepsis, unspecified: Secondary | ICD-10-CM | POA: Diagnosis not present

## 2021-06-24 DIAGNOSIS — Z9049 Acquired absence of other specified parts of digestive tract: Secondary | ICD-10-CM | POA: Diagnosis not present

## 2021-06-24 DIAGNOSIS — Z7901 Long term (current) use of anticoagulants: Secondary | ICD-10-CM

## 2021-06-24 DIAGNOSIS — R630 Anorexia: Secondary | ICD-10-CM | POA: Diagnosis present

## 2021-06-24 DIAGNOSIS — J440 Chronic obstructive pulmonary disease with acute lower respiratory infection: Secondary | ICD-10-CM | POA: Diagnosis present

## 2021-06-24 DIAGNOSIS — E860 Dehydration: Secondary | ICD-10-CM | POA: Diagnosis present

## 2021-06-24 DIAGNOSIS — E785 Hyperlipidemia, unspecified: Secondary | ICD-10-CM | POA: Diagnosis present

## 2021-06-24 DIAGNOSIS — I130 Hypertensive heart and chronic kidney disease with heart failure and stage 1 through stage 4 chronic kidney disease, or unspecified chronic kidney disease: Secondary | ICD-10-CM | POA: Diagnosis present

## 2021-06-24 DIAGNOSIS — K529 Noninfective gastroenteritis and colitis, unspecified: Secondary | ICD-10-CM | POA: Diagnosis present

## 2021-06-24 DIAGNOSIS — B955 Unspecified streptococcus as the cause of diseases classified elsewhere: Secondary | ICD-10-CM | POA: Diagnosis not present

## 2021-06-24 DIAGNOSIS — Z950 Presence of cardiac pacemaker: Secondary | ICD-10-CM

## 2021-06-24 DIAGNOSIS — Z8249 Family history of ischemic heart disease and other diseases of the circulatory system: Secondary | ICD-10-CM

## 2021-06-24 DIAGNOSIS — J9620 Acute and chronic respiratory failure, unspecified whether with hypoxia or hypercapnia: Secondary | ICD-10-CM | POA: Diagnosis not present

## 2021-06-24 DIAGNOSIS — N170 Acute kidney failure with tubular necrosis: Secondary | ICD-10-CM | POA: Diagnosis present

## 2021-06-24 DIAGNOSIS — Z87891 Personal history of nicotine dependence: Secondary | ICD-10-CM

## 2021-06-24 DIAGNOSIS — Y831 Surgical operation with implant of artificial internal device as the cause of abnormal reaction of the patient, or of later complication, without mention of misadventure at the time of the procedure: Secondary | ICD-10-CM | POA: Diagnosis present

## 2021-06-24 DIAGNOSIS — Z6830 Body mass index (BMI) 30.0-30.9, adult: Secondary | ICD-10-CM

## 2021-06-24 DIAGNOSIS — R197 Diarrhea, unspecified: Secondary | ICD-10-CM | POA: Diagnosis not present

## 2021-06-24 DIAGNOSIS — E669 Obesity, unspecified: Secondary | ICD-10-CM | POA: Diagnosis present

## 2021-06-24 DIAGNOSIS — Z961 Presence of intraocular lens: Secondary | ICD-10-CM | POA: Diagnosis present

## 2021-06-24 DIAGNOSIS — I340 Nonrheumatic mitral (valve) insufficiency: Secondary | ICD-10-CM | POA: Diagnosis not present

## 2021-06-24 DIAGNOSIS — K5730 Diverticulosis of large intestine without perforation or abscess without bleeding: Secondary | ICD-10-CM | POA: Diagnosis not present

## 2021-06-24 DIAGNOSIS — Z825 Family history of asthma and other chronic lower respiratory diseases: Secondary | ICD-10-CM

## 2021-06-24 DIAGNOSIS — E871 Hypo-osmolality and hyponatremia: Secondary | ICD-10-CM | POA: Diagnosis present

## 2021-06-24 DIAGNOSIS — J188 Other pneumonia, unspecified organism: Secondary | ICD-10-CM | POA: Diagnosis not present

## 2021-06-24 DIAGNOSIS — J441 Chronic obstructive pulmonary disease with (acute) exacerbation: Secondary | ICD-10-CM | POA: Diagnosis not present

## 2021-06-24 DIAGNOSIS — R509 Fever, unspecified: Secondary | ICD-10-CM | POA: Diagnosis not present

## 2021-06-24 DIAGNOSIS — J9601 Acute respiratory failure with hypoxia: Secondary | ICD-10-CM | POA: Diagnosis not present

## 2021-06-24 DIAGNOSIS — J849 Interstitial pulmonary disease, unspecified: Secondary | ICD-10-CM | POA: Diagnosis not present

## 2021-06-24 DIAGNOSIS — N390 Urinary tract infection, site not specified: Secondary | ICD-10-CM | POA: Diagnosis present

## 2021-06-24 DIAGNOSIS — I272 Pulmonary hypertension, unspecified: Secondary | ICD-10-CM | POA: Diagnosis not present

## 2021-06-24 DIAGNOSIS — R7881 Bacteremia: Secondary | ICD-10-CM | POA: Diagnosis not present

## 2021-06-24 DIAGNOSIS — R079 Chest pain, unspecified: Secondary | ICD-10-CM | POA: Diagnosis not present

## 2021-06-24 DIAGNOSIS — M419 Scoliosis, unspecified: Secondary | ICD-10-CM | POA: Diagnosis not present

## 2021-06-24 DIAGNOSIS — R0689 Other abnormalities of breathing: Secondary | ICD-10-CM | POA: Diagnosis not present

## 2021-06-24 DIAGNOSIS — A419 Sepsis, unspecified organism: Secondary | ICD-10-CM | POA: Diagnosis not present

## 2021-06-24 DIAGNOSIS — Z8349 Family history of other endocrine, nutritional and metabolic diseases: Secondary | ICD-10-CM

## 2021-06-24 DIAGNOSIS — G4733 Obstructive sleep apnea (adult) (pediatric): Secondary | ICD-10-CM | POA: Diagnosis present

## 2021-06-24 DIAGNOSIS — R0902 Hypoxemia: Secondary | ICD-10-CM | POA: Diagnosis not present

## 2021-06-24 DIAGNOSIS — I959 Hypotension, unspecified: Secondary | ICD-10-CM | POA: Diagnosis not present

## 2021-06-24 DIAGNOSIS — K921 Melena: Secondary | ICD-10-CM | POA: Diagnosis not present

## 2021-06-24 LAB — CBC
HCT: 25 % — ABNORMAL LOW (ref 36.0–46.0)
Hemoglobin: 8.1 g/dL — ABNORMAL LOW (ref 12.0–15.0)
MCH: 27.6 pg (ref 26.0–34.0)
MCHC: 32.4 g/dL (ref 30.0–36.0)
MCV: 85 fL (ref 80.0–100.0)
Platelets: 99 10*3/uL — ABNORMAL LOW (ref 150–400)
RBC: 2.94 MIL/uL — ABNORMAL LOW (ref 3.87–5.11)
RDW: 13.7 % (ref 11.5–15.5)
WBC: 20.1 10*3/uL — ABNORMAL HIGH (ref 4.0–10.5)
nRBC: 0 % (ref 0.0–0.2)

## 2021-06-24 LAB — COMPREHENSIVE METABOLIC PANEL
ALT: 12 U/L (ref 0–44)
AST: 19 U/L (ref 15–41)
Albumin: 2.5 g/dL — ABNORMAL LOW (ref 3.5–5.0)
Alkaline Phosphatase: 128 U/L — ABNORMAL HIGH (ref 38–126)
Anion gap: 8 (ref 5–15)
BUN: 57 mg/dL — ABNORMAL HIGH (ref 8–23)
CO2: 19 mmol/L — ABNORMAL LOW (ref 22–32)
Calcium: 8.2 mg/dL — ABNORMAL LOW (ref 8.9–10.3)
Chloride: 101 mmol/L (ref 98–111)
Creatinine, Ser: 2.27 mg/dL — ABNORMAL HIGH (ref 0.44–1.00)
GFR, Estimated: 22 mL/min — ABNORMAL LOW (ref >60)
Glucose, Bld: 122 mg/dL — ABNORMAL HIGH (ref 70–99)
Potassium: 4.2 mmol/L (ref 3.5–5.1)
Sodium: 128 mmol/L — ABNORMAL LOW (ref 135–145)
Total Bilirubin: 2.7 mg/dL — ABNORMAL HIGH (ref 0.3–1.2)
Total Protein: 6.3 g/dL — ABNORMAL LOW (ref 6.5–8.1)

## 2021-06-24 LAB — LIPASE, BLOOD: Lipase: 45 U/L (ref 11–51)

## 2021-06-24 MED ORDER — ONDANSETRON HCL 4 MG/2ML IJ SOLN
4.0000 mg | Freq: Once | INTRAMUSCULAR | Status: AC
  Administered 2021-06-24: 4 mg via INTRAVENOUS
  Filled 2021-06-24: qty 2

## 2021-06-24 MED ORDER — FENTANYL CITRATE PF 50 MCG/ML IJ SOSY
12.5000 ug | PREFILLED_SYRINGE | Freq: Once | INTRAMUSCULAR | Status: AC
  Administered 2021-06-24: 12.5 ug via INTRAVENOUS
  Filled 2021-06-24: qty 1

## 2021-06-24 MED ORDER — MORPHINE SULFATE (PF) 4 MG/ML IV SOLN
4.0000 mg | Freq: Once | INTRAVENOUS | Status: AC
  Administered 2021-06-24: 4 mg via INTRAVENOUS
  Filled 2021-06-24: qty 1

## 2021-06-24 NOTE — ED Triage Notes (Signed)
Pt comes into the ED via EMS from home dx with the flu 3 weeks ago, has been feeling bad since 12/24, having diarrhea denies vomiting, not eating well, on 2L [place] all the time, pt placed on 4L [place]     93-94 on 2L [place] Temp 100.3 109/60 [id number]

## 2021-06-24 NOTE — ED Provider Notes (Signed)
°  Emergency Medicine Provider Triage Evaluation Note  Debra Saunders , a 76 y.o.female,  was evaluated in triage.  Pt complains of abdominal pain, diarrhea, loss of appetite for the past few days.  Recently diagnosed with flu 3 weeks ago   Review of Systems  Positive: Abdominal pain, diarrhea, loss of appetite Negative: Denies fever, chest pain, vomiting  Physical Exam   Vitals:   06/24/21 1456  BP: (!) 101/55  Pulse: 93  Resp: 20  Temp: 99.3 F (37.4 C)  SpO2: 94%   Gen:   Awake, distressed Resp:  Normal effort  MSK:   Moves extremities without difficulty  Other:  Significant tenderness in her epigastric and umbilicus region  Medical Decision Making  Given the patient's initial medical screening exam, the following diagnostic evaluation has been ordered. The patient will be placed in the appropriate treatment space, once one is available, to complete the evaluation and treatment. I have discussed the plan of care with the patient and I have advised the patient that an ED physician or mid-level practitioner will reevaluate their condition after the test results have been received, as the results may give them additional insight into the type of treatment they may need.    Diagnostics: Labs, abdominal CT  Treatments: none immediately   Teodoro Spray, PA 06/24/21 1503    Duffy Bruce, MD 06/24/21 [phone]

## 2021-06-25 ENCOUNTER — Inpatient Hospital Stay
Admission: EM | Admit: 2021-06-25 | Discharge: 2021-06-29 | DRG: 314 | Disposition: A | Discharge status: Other Acute Inpatient Hospital | Payer: Medicare HMO | Attending: Internal Medicine | Admitting: Internal Medicine

## 2021-06-25 ENCOUNTER — Emergency Department: Payer: Medicare HMO

## 2021-06-25 ENCOUNTER — Encounter: Payer: Self-pay | Admitting: Internal Medicine

## 2021-06-25 ENCOUNTER — Other Ambulatory Visit: Payer: Self-pay

## 2021-06-25 DIAGNOSIS — I4891 Unspecified atrial fibrillation: Secondary | ICD-10-CM | POA: Diagnosis present

## 2021-06-25 DIAGNOSIS — I248 Other forms of acute ischemic heart disease: Secondary | ICD-10-CM

## 2021-06-25 DIAGNOSIS — I272 Pulmonary hypertension, unspecified: Secondary | ICD-10-CM | POA: Diagnosis not present

## 2021-06-25 DIAGNOSIS — I10 Essential (primary) hypertension: Secondary | ICD-10-CM | POA: Diagnosis present

## 2021-06-25 DIAGNOSIS — J9601 Acute respiratory failure with hypoxia: Secondary | ICD-10-CM

## 2021-06-25 DIAGNOSIS — A419 Sepsis, unspecified organism: Secondary | ICD-10-CM | POA: Diagnosis not present

## 2021-06-25 DIAGNOSIS — R6521 Severe sepsis with septic shock: Secondary | ICD-10-CM | POA: Diagnosis present

## 2021-06-25 DIAGNOSIS — Z950 Presence of cardiac pacemaker: Secondary | ICD-10-CM | POA: Diagnosis not present

## 2021-06-25 DIAGNOSIS — J189 Pneumonia, unspecified organism: Secondary | ICD-10-CM | POA: Diagnosis present

## 2021-06-25 DIAGNOSIS — N170 Acute kidney failure with tubular necrosis: Secondary | ICD-10-CM | POA: Diagnosis present

## 2021-06-25 DIAGNOSIS — J441 Chronic obstructive pulmonary disease with (acute) exacerbation: Secondary | ICD-10-CM | POA: Diagnosis not present

## 2021-06-25 DIAGNOSIS — J849 Interstitial pulmonary disease, unspecified: Secondary | ICD-10-CM | POA: Diagnosis not present

## 2021-06-25 DIAGNOSIS — R079 Chest pain, unspecified: Secondary | ICD-10-CM | POA: Diagnosis not present

## 2021-06-25 DIAGNOSIS — N179 Acute kidney failure, unspecified: Secondary | ICD-10-CM | POA: Diagnosis not present

## 2021-06-25 DIAGNOSIS — J188 Other pneumonia, unspecified organism: Secondary | ICD-10-CM | POA: Diagnosis present

## 2021-06-25 DIAGNOSIS — I5031 Acute diastolic (congestive) heart failure: Secondary | ICD-10-CM | POA: Diagnosis not present

## 2021-06-25 DIAGNOSIS — N1832 Chronic kidney disease, stage 3b: Secondary | ICD-10-CM | POA: Diagnosis not present

## 2021-06-25 DIAGNOSIS — Z20822 Contact with and (suspected) exposure to covid-19: Secondary | ICD-10-CM | POA: Diagnosis present

## 2021-06-25 DIAGNOSIS — I480 Paroxysmal atrial fibrillation: Secondary | ICD-10-CM | POA: Diagnosis not present

## 2021-06-25 DIAGNOSIS — I340 Nonrheumatic mitral (valve) insufficiency: Secondary | ICD-10-CM | POA: Diagnosis not present

## 2021-06-25 DIAGNOSIS — J9610 Chronic respiratory failure, unspecified whether with hypoxia or hypercapnia: Secondary | ICD-10-CM | POA: Diagnosis present

## 2021-06-25 DIAGNOSIS — I4819 Other persistent atrial fibrillation: Secondary | ICD-10-CM

## 2021-06-25 DIAGNOSIS — R112 Nausea with vomiting, unspecified: Secondary | ICD-10-CM

## 2021-06-25 DIAGNOSIS — T827XXA Infection and inflammatory reaction due to other cardiac and vascular devices, implants and grafts, initial encounter: Secondary | ICD-10-CM | POA: Diagnosis present

## 2021-06-25 DIAGNOSIS — I130 Hypertensive heart and chronic kidney disease with heart failure and stage 1 through stage 4 chronic kidney disease, or unspecified chronic kidney disease: Secondary | ICD-10-CM | POA: Diagnosis present

## 2021-06-25 DIAGNOSIS — K921 Melena: Secondary | ICD-10-CM | POA: Diagnosis present

## 2021-06-25 DIAGNOSIS — Y831 Surgical operation with implant of artificial internal device as the cause of abnormal reaction of the patient, or of later complication, without mention of misadventure at the time of the procedure: Secondary | ICD-10-CM | POA: Diagnosis present

## 2021-06-25 DIAGNOSIS — A409 Streptococcal sepsis, unspecified: Secondary | ICD-10-CM | POA: Diagnosis not present

## 2021-06-25 DIAGNOSIS — I5032 Chronic diastolic (congestive) heart failure: Secondary | ICD-10-CM | POA: Diagnosis present

## 2021-06-25 DIAGNOSIS — Z7901 Long term (current) use of anticoagulants: Secondary | ICD-10-CM | POA: Diagnosis not present

## 2021-06-25 DIAGNOSIS — Z880 Allergy status to penicillin: Secondary | ICD-10-CM | POA: Diagnosis not present

## 2021-06-25 DIAGNOSIS — I82409 Acute embolism and thrombosis of unspecified deep veins of unspecified lower extremity: Secondary | ICD-10-CM | POA: Diagnosis present

## 2021-06-25 DIAGNOSIS — R6520 Severe sepsis without septic shock: Secondary | ICD-10-CM | POA: Diagnosis not present

## 2021-06-25 DIAGNOSIS — E785 Hyperlipidemia, unspecified: Secondary | ICD-10-CM | POA: Diagnosis present

## 2021-06-25 DIAGNOSIS — R7881 Bacteremia: Secondary | ICD-10-CM | POA: Diagnosis present

## 2021-06-25 DIAGNOSIS — N390 Urinary tract infection, site not specified: Secondary | ICD-10-CM | POA: Diagnosis present

## 2021-06-25 DIAGNOSIS — R197 Diarrhea, unspecified: Secondary | ICD-10-CM | POA: Diagnosis present

## 2021-06-25 DIAGNOSIS — D62 Acute posthemorrhagic anemia: Secondary | ICD-10-CM | POA: Diagnosis not present

## 2021-06-25 DIAGNOSIS — E669 Obesity, unspecified: Secondary | ICD-10-CM | POA: Diagnosis present

## 2021-06-25 DIAGNOSIS — D696 Thrombocytopenia, unspecified: Secondary | ICD-10-CM | POA: Diagnosis present

## 2021-06-25 DIAGNOSIS — J9621 Acute and chronic respiratory failure with hypoxia: Secondary | ICD-10-CM | POA: Diagnosis present

## 2021-06-25 DIAGNOSIS — T827XXD Infection and inflammatory reaction due to other cardiac and vascular devices, implants and grafts, subsequent encounter: Secondary | ICD-10-CM | POA: Diagnosis not present

## 2021-06-25 DIAGNOSIS — F32A Depression, unspecified: Secondary | ICD-10-CM | POA: Diagnosis present

## 2021-06-25 DIAGNOSIS — N1831 Chronic kidney disease, stage 3a: Secondary | ICD-10-CM | POA: Diagnosis present

## 2021-06-25 DIAGNOSIS — A408 Other streptococcal sepsis: Secondary | ICD-10-CM | POA: Diagnosis present

## 2021-06-25 DIAGNOSIS — K922 Gastrointestinal hemorrhage, unspecified: Secondary | ICD-10-CM | POA: Diagnosis present

## 2021-06-25 DIAGNOSIS — E1122 Type 2 diabetes mellitus with diabetic chronic kidney disease: Secondary | ICD-10-CM | POA: Diagnosis present

## 2021-06-25 DIAGNOSIS — B961 Klebsiella pneumoniae [K. pneumoniae] as the cause of diseases classified elsewhere: Secondary | ICD-10-CM | POA: Diagnosis present

## 2021-06-25 DIAGNOSIS — D649 Anemia, unspecified: Secondary | ICD-10-CM | POA: Diagnosis not present

## 2021-06-25 DIAGNOSIS — I2690 Septic pulmonary embolism without acute cor pulmonale: Secondary | ICD-10-CM | POA: Diagnosis not present

## 2021-06-25 DIAGNOSIS — J440 Chronic obstructive pulmonary disease with acute lower respiratory infection: Secondary | ICD-10-CM | POA: Diagnosis present

## 2021-06-25 DIAGNOSIS — R109 Unspecified abdominal pain: Secondary | ICD-10-CM

## 2021-06-25 DIAGNOSIS — R1011 Right upper quadrant pain: Secondary | ICD-10-CM | POA: Diagnosis not present

## 2021-06-25 DIAGNOSIS — I700 Atherosclerosis of aorta: Secondary | ICD-10-CM | POA: Diagnosis present

## 2021-06-25 DIAGNOSIS — B955 Unspecified streptococcus as the cause of diseases classified elsewhere: Secondary | ICD-10-CM | POA: Diagnosis not present

## 2021-06-25 DIAGNOSIS — Z9049 Acquired absence of other specified parts of digestive tract: Secondary | ICD-10-CM | POA: Diagnosis not present

## 2021-06-25 DIAGNOSIS — E871 Hypo-osmolality and hyponatremia: Secondary | ICD-10-CM | POA: Diagnosis present

## 2021-06-25 LAB — BLOOD CULTURE ID PANEL (REFLEXED) - BCID2

## 2021-06-25 LAB — HEPATIC FUNCTION PANEL
ALT: 13 U/L (ref 0–44)
AST: 21 U/L (ref 15–41)
Albumin: 2.2 g/dL — ABNORMAL LOW (ref 3.5–5.0)
Alkaline Phosphatase: 126 U/L (ref 38–126)
Bilirubin, Direct: 1.7 mg/dL — ABNORMAL HIGH (ref 0.0–0.2)
Indirect Bilirubin: 1.3 mg/dL — ABNORMAL HIGH (ref 0.3–0.9)
Total Bilirubin: 3 mg/dL — ABNORMAL HIGH (ref 0.3–1.2)
Total Protein: 6.2 g/dL — ABNORMAL LOW (ref 6.5–8.1)

## 2021-06-25 LAB — PATHOLOGIST SMEAR REVIEW

## 2021-06-25 LAB — BASIC METABOLIC PANEL
Anion gap: 8 (ref 5–15)
Anion gap: 11 (ref 5–15)
BUN: 74 mg/dL — ABNORMAL HIGH (ref 8–23)
BUN: 83 mg/dL — ABNORMAL HIGH (ref 8–23)
CO2: 20 mmol/L — ABNORMAL LOW (ref 22–32)
CO2: 15 mmol/L — ABNORMAL LOW (ref 22–32)
Calcium: 7.9 mg/dL — ABNORMAL LOW (ref 8.9–10.3)
Calcium: 8 mg/dL — ABNORMAL LOW (ref 8.9–10.3)
Chloride: 98 mmol/L (ref 98–111)
Chloride: 98 mmol/L (ref 98–111)
Creatinine, Ser: 3.11 mg/dL — ABNORMAL HIGH (ref 0.44–1.00)
Creatinine, Ser: 3.21 mg/dL — ABNORMAL HIGH (ref 0.44–1.00)
GFR, Estimated: 15 mL/min — ABNORMAL LOW (ref >60)
GFR, Estimated: 14 mL/min — ABNORMAL LOW (ref >60)
Glucose, Bld: 171 mg/dL — ABNORMAL HIGH (ref 70–99)
Glucose, Bld: 201 mg/dL — ABNORMAL HIGH (ref 70–99)
Potassium: 4.9 mmol/L (ref 3.5–5.1)
Potassium: 5.1 mmol/L (ref 3.5–5.1)
Sodium: 126 mmol/L — ABNORMAL LOW (ref 135–145)
Sodium: 124 mmol/L — ABNORMAL LOW (ref 135–145)

## 2021-06-25 LAB — URINALYSIS, ROUTINE W REFLEX MICROSCOPIC
Bilirubin Urine: NEGATIVE
Glucose, UA: NEGATIVE mg/dL
Hgb urine dipstick: NEGATIVE
Ketones, ur: NEGATIVE mg/dL
Nitrite: NEGATIVE
Protein, ur: 30 mg/dL — AB
Specific Gravity, Urine: 1.016 (ref 1.005–1.030)
pH: 5 (ref 5.0–8.0)

## 2021-06-25 LAB — CBC
HCT: 22 % — ABNORMAL LOW (ref 36.0–46.0)
HCT: 25.3 % — ABNORMAL LOW (ref 36.0–46.0)
Hemoglobin: 7 g/dL — ABNORMAL LOW (ref 12.0–15.0)
Hemoglobin: 8.2 g/dL — ABNORMAL LOW (ref 12.0–15.0)
MCH: 27.3 pg (ref 26.0–34.0)
MCH: 27.7 pg (ref 26.0–34.0)
MCHC: 31.8 g/dL (ref 30.0–36.0)
MCHC: 32.4 g/dL (ref 30.0–36.0)
MCV: 85.9 fL (ref 80.0–100.0)
MCV: 85.5 fL (ref 80.0–100.0)
Platelets: 75 10*3/uL — ABNORMAL LOW (ref 150–400)
Platelets: 87 10*3/uL — ABNORMAL LOW (ref 150–400)
RBC: 2.56 MIL/uL — ABNORMAL LOW (ref 3.87–5.11)
RBC: 2.96 MIL/uL — ABNORMAL LOW (ref 3.87–5.11)
RDW: 14.3 % (ref 11.5–15.5)
RDW: 14.7 % (ref 11.5–15.5)
WBC: 18.3 10*3/uL — ABNORMAL HIGH (ref 4.0–10.5)
WBC: 22.8 10*3/uL — ABNORMAL HIGH (ref 4.0–10.5)
nRBC: 0 % (ref 0.0–0.2)
nRBC: 0.1 % (ref 0.0–0.2)

## 2021-06-25 LAB — BLOOD GAS, ARTERIAL
Acid-base deficit: 9.2 mmol/L — ABNORMAL HIGH (ref 0.0–2.0)
Allens test (pass/fail): POSITIVE — AB
Bicarbonate: 17.1 mmol/L — ABNORMAL LOW (ref 20.0–28.0)
Delivery systems: POSITIVE
Expiratory PAP: 6
FIO2: 0.4
Inspiratory PAP: 12
O2 Saturation: 98 %
Patient temperature: 37
RATE: 8 resp/min
pCO2 arterial: 38 mmHg (ref 32.0–48.0)
pH, Arterial: 7.26 — ABNORMAL LOW (ref 7.350–7.450)
pO2, Arterial: 118 mmHg — ABNORMAL HIGH (ref 83.0–108.0)

## 2021-06-25 LAB — PROCALCITONIN: Procalcitonin: 18.79 ng/mL

## 2021-06-25 LAB — TROPONIN I (HIGH SENSITIVITY)
Troponin I (High Sensitivity): 276 ng/L (ref <18)
Troponin I (High Sensitivity): 201 ng/L (ref <18)

## 2021-06-25 LAB — PREPARE RBC (CROSSMATCH)

## 2021-06-25 LAB — BRAIN NATRIURETIC PEPTIDE: B Natriuretic Peptide: 344.7 pg/mL — ABNORMAL HIGH (ref 0.0–100.0)

## 2021-06-25 LAB — HEMOGLOBIN A1C
Hgb A1c MFr Bld: 5.4 % (ref 4.8–5.6)
Mean Plasma Glucose: 108.28 mg/dL

## 2021-06-25 LAB — RESP PANEL BY RT-PCR (FLU A&B, COVID) ARPGX2
Influenza A by PCR: NEGATIVE
Influenza B by PCR: NEGATIVE
SARS Coronavirus 2 by RT PCR: NEGATIVE

## 2021-06-25 LAB — LACTIC ACID, PLASMA
Lactic Acid, Venous: 1 mmol/L (ref 0.5–1.9)
Lactic Acid, Venous: 1 mmol/L (ref 0.5–1.9)

## 2021-06-25 LAB — PROTIME-INR
INR: 2.1 — ABNORMAL HIGH (ref 0.8–1.2)
Prothrombin Time: 23.6 seconds — ABNORMAL HIGH (ref 11.4–15.2)

## 2021-06-25 LAB — LACTATE DEHYDROGENASE: LDH: 184 U/L (ref 98–192)

## 2021-06-25 LAB — SAVE SMEAR(SSMR), FOR PROVIDER SLIDE REVIEW

## 2021-06-25 LAB — APTT: aPTT: 29 seconds (ref 24–36)

## 2021-06-25 MED ORDER — METHYLPREDNISOLONE SODIUM SUCC 40 MG IJ SOLR
40.0000 mg | Freq: Two times a day (BID) | INTRAMUSCULAR | Status: DC
  Administered 2021-06-25 – 2021-06-27 (×5): 40 mg via INTRAVENOUS
  Filled 2021-06-25 (×5): qty 1

## 2021-06-25 MED ORDER — SODIUM CHLORIDE 0.9 % IV SOLN
1.0000 g | Freq: Once | INTRAVENOUS | Status: AC
  Administered 2021-06-25: 1 g via INTRAVENOUS
  Filled 2021-06-25: qty 1

## 2021-06-25 MED ORDER — DM-GUAIFENESIN ER 30-600 MG PO TB12: 1.0000 | ORAL_TABLET | Freq: Two times a day (BID) | ORAL | Status: DC | PRN

## 2021-06-25 MED ORDER — FENTANYL CITRATE PF 50 MCG/ML IJ SOSY
25.0000 ug | PREFILLED_SYRINGE | Freq: Once | INTRAMUSCULAR | Status: AC
  Administered 2021-06-25: 25 ug via INTRAVENOUS
  Filled 2021-06-25: qty 1

## 2021-06-25 MED ORDER — VANCOMYCIN VARIABLE DOSE PER UNSTABLE RENAL FUNCTION (PHARMACIST DOSING)
Status: DC
  Filled 2021-06-25: qty 1

## 2021-06-25 MED ORDER — SELEXIPAG 1600 MCG PO TABS: 1600.0000 ug | ORAL_TABLET | Freq: Two times a day (BID) | ORAL | Status: DC

## 2021-06-25 MED ORDER — HYDROCORTISONE SOD SUC (PF) 100 MG IJ SOLR
100.0000 mg | Freq: Once | INTRAMUSCULAR | Status: AC
  Administered 2021-06-25: 100 mg via INTRAVENOUS
  Filled 2021-06-25: qty 2

## 2021-06-25 MED ORDER — DIFLUPREDNATE 0.05 % OP EMUL: 1.0000 [drp] | Freq: Two times a day (BID) | OPHTHALMIC | Status: DC

## 2021-06-25 MED ORDER — SODIUM CHLORIDE 0.9 % IV SOLN
500.0000 mg | INTRAVENOUS | Status: DC
  Administered 2021-06-26: 500 mg via INTRAVENOUS
  Filled 2021-06-25: qty 5

## 2021-06-25 MED ORDER — MACITENTAN 10 MG PO TABS
10.0000 mg | ORAL_TABLET | Freq: Every day | ORAL | Status: DC
Start: 2021-06-25 — End: 2021-06-25

## 2021-06-25 MED ORDER — PANTOPRAZOLE SODIUM 40 MG IV SOLR: 40.0000 mg | Freq: Two times a day (BID) | INTRAVENOUS | Status: DC

## 2021-06-25 MED ORDER — MENTHOL (TOPICAL ANALGESIC) 5 % EX PTCH: MEDICATED_PATCH | Freq: Four times a day (QID) | CUTANEOUS | Status: DC | PRN

## 2021-06-25 MED ORDER — PANTOPRAZOLE SODIUM 40 MG IV SOLR
40.0000 mg | Freq: Two times a day (BID) | INTRAVENOUS | Status: DC
  Administered 2021-06-25 – 2021-06-29 (×8): 40 mg via INTRAVENOUS
  Filled 2021-06-25 (×8): qty 40

## 2021-06-25 MED ORDER — LACTATED RINGERS IV SOLN: INTRAVENOUS | Status: DC

## 2021-06-25 MED ORDER — MECLIZINE HCL 25 MG PO TABS
25.0000 mg | ORAL_TABLET | Freq: Three times a day (TID) | ORAL | Status: DC | PRN
  Filled 2021-06-25: qty 1

## 2021-06-25 MED ORDER — ACETAMINOPHEN 325 MG PO TABS
650.0000 mg | ORAL_TABLET | Freq: Four times a day (QID) | ORAL | Status: DC | PRN
  Administered 2021-06-25 – 2021-06-26 (×2): 650 mg via ORAL
  Filled 2021-06-25 (×2): qty 2

## 2021-06-25 MED ORDER — FUROSEMIDE 10 MG/ML IJ SOLN
20.0000 mg | Freq: Once | INTRAMUSCULAR | Status: AC
  Administered 2021-06-25: 20 mg via INTRAVENOUS
  Filled 2021-06-25: qty 4

## 2021-06-25 MED ORDER — VANCOMYCIN HCL 750 MG/150ML IV SOLN
750.0000 mg | Freq: Once | INTRAVENOUS | Status: AC
  Administered 2021-06-25: 750 mg via INTRAVENOUS
  Filled 2021-06-25: qty 150

## 2021-06-25 MED ORDER — MACITENTAN 10 MG PO TABS
10.0000 mg | ORAL_TABLET | Freq: Every day | ORAL | Status: DC
  Filled 2021-06-25: qty 1

## 2021-06-25 MED ORDER — NOREPINEPHRINE 4 MG/250ML-% IV SOLN
0.0000 ug/min | INTRAVENOUS | Status: DC
  Administered 2021-06-25: 2 ug/min via INTRAVENOUS
  Administered 2021-06-26: 3 ug/min via INTRAVENOUS
  Filled 2021-06-25 (×2): qty 250

## 2021-06-25 MED ORDER — VANCOMYCIN HCL IN DEXTROSE 1-5 GM/200ML-% IV SOLN
1000.0000 mg | Freq: Once | INTRAVENOUS | Status: AC
  Administered 2021-06-25: 1000 mg via INTRAVENOUS
  Filled 2021-06-25: qty 200

## 2021-06-25 MED ORDER — DIGOXIN 125 MCG PO TABS: 125.0000 ug | ORAL_TABLET | Freq: Every day | ORAL | Status: DC

## 2021-06-25 MED ORDER — IPRATROPIUM-ALBUTEROL 0.5-2.5 (3) MG/3ML IN SOLN
3.0000 mL | Freq: Once | RESPIRATORY_TRACT | Status: AC
  Administered 2021-06-25: 3 mL via RESPIRATORY_TRACT
  Filled 2021-06-25: qty 3

## 2021-06-25 MED ORDER — LACTATED RINGERS IV BOLUS
2000.0000 mL | Freq: Once | INTRAVENOUS | Status: AC
  Administered 2021-06-25: 2000 mL via INTRAVENOUS

## 2021-06-25 MED ORDER — IPRATROPIUM-ALBUTEROL 0.5-2.5 (3) MG/3ML IN SOLN
3.0000 mL | RESPIRATORY_TRACT | Status: DC
  Administered 2021-06-25 – 2021-06-29 (×22): 3 mL via RESPIRATORY_TRACT
  Filled 2021-06-25: qty 3
  Filled 2021-06-25: qty 9
  Filled 2021-06-25 (×15): qty 3

## 2021-06-25 MED ORDER — SODIUM CHLORIDE 0.9 % IV SOLN
2.0000 g | INTRAVENOUS | Status: DC
  Administered 2021-06-26 – 2021-06-29 (×4): 2 g via INTRAVENOUS
  Filled 2021-06-25 (×4): qty 20

## 2021-06-25 MED ORDER — ROSUVASTATIN CALCIUM 10 MG PO TABS
10.0000 mg | ORAL_TABLET | Freq: Every day | ORAL | Status: DC
  Administered 2021-06-25 – 2021-06-29 (×5): 10 mg via ORAL
  Filled 2021-06-25 (×5): qty 1

## 2021-06-25 MED ORDER — SODIUM CHLORIDE 0.9 % IV SOLN
500.0000 mg | Freq: Once | INTRAVENOUS | Status: AC
  Administered 2021-06-25: 500 mg via INTRAVENOUS
  Filled 2021-06-25: qty 5

## 2021-06-25 MED ORDER — ALBUTEROL SULFATE (2.5 MG/3ML) 0.083% IN NEBU: 2.5000 mg | INHALATION_SOLUTION | RESPIRATORY_TRACT | Status: DC | PRN

## 2021-06-25 MED ORDER — MORPHINE SULFATE (PF) 2 MG/ML IV SOLN
0.5000 mg | INTRAVENOUS | Status: DC | PRN
  Administered 2021-06-27 – 2021-06-28 (×2): 0.5 mg via INTRAVENOUS
  Filled 2021-06-25: qty 1

## 2021-06-25 MED ORDER — SODIUM CHLORIDE 0.9% IV SOLUTION: Freq: Once | INTRAVENOUS | Status: AC

## 2021-06-25 MED ORDER — CHOLESTYRAMINE 4 G PO PACK
4.0000 g | PACK | Freq: Every day | ORAL | Status: DC
  Administered 2021-06-26 – 2021-06-29 (×3): 4 g via ORAL
  Filled 2021-06-25 (×3): qty 1

## 2021-06-25 MED ORDER — DULOXETINE HCL 30 MG PO CPEP
60.0000 mg | ORAL_CAPSULE | Freq: Every day | ORAL | Status: DC
  Administered 2021-06-25 – 2021-06-29 (×5): 60 mg via ORAL
  Filled 2021-06-25 (×4): qty 1
  Filled 2021-06-25: qty 2

## 2021-06-25 MED ORDER — GABAPENTIN 300 MG PO CAPS
300.0000 mg | ORAL_CAPSULE | Freq: Two times a day (BID) | ORAL | Status: DC
Start: 2021-06-25 — End: 2021-06-29
  Administered 2021-06-25 – 2021-06-29 (×8): 300 mg via ORAL
  Filled 2021-06-25 (×8): qty 1

## 2021-06-25 MED ORDER — ONDANSETRON HCL 4 MG/2ML IJ SOLN: 4.0000 mg | Freq: Three times a day (TID) | INTRAMUSCULAR | Status: DC | PRN

## 2021-06-25 MED ORDER — SODIUM CHLORIDE 0.9 % IV SOLN: 2.0000 g | INTRAVENOUS | Status: DC

## 2021-06-25 NOTE — ED Provider Notes (Addendum)
[facility] Provider Note    Event Date/Time   First MD Initiated Contact with Patient 06/25/21 [phone]     (approximate)   History   Diarrhea and Loss of appetite   HPI  Debra Saunders is a 76 y.o. female with a history of atrial fibrillation on Eliquis, nonischemic cardiomyopathy with preserved EF, pulmonary artery hypertension, diabetes, hypertension, hyperlipidemia, COPD who presents for evaluation of abdominal pain.  Patient reports that a month ago she had the flu and since then has been deteriorating.  Over the last 2 weeks she has had diffuse sharp/cramping abdominal pain associated with several daily episodes of diarrhea.  She has noticed that her stool has been black.  Has had intermittent fevers has had nausea, severely diminished appetite but no vomiting.  Still taking her medications.  Patient reports that she feels very weak and dehydrated.  She denies chest pain or shortness of breath cough,  dysuria or hematuria.     Physical Exam   Triage Vital Signs: ED Triage Vitals  Enc Vitals Group     BP 06/24/21 1456 (!) 101/55     Pulse Rate 06/24/21 1456 93     Resp 06/24/21 1456 20     Temp 06/24/21 1456 99.3 F (37.4 C)     Temp Source 06/24/21 1859 Oral     SpO2 06/24/21 1456 94 %     Weight --      Height --      Head Circumference --      Peak Flow --      Pain Score 06/25/21 0253 8     Pain Loc --      Pain Edu? --      Excl. in [place]? --     Most recent vital signs: Vitals:   06/25/21 0630 06/25/21 0645  BP: (S) (!) 73/26 (!) 140/53  Pulse: 92 91  Resp: (S) (!) 27 (!) 21  Temp:    SpO2: (S) (!) 89% 91%     General: Awake, looks dry and uncomfortable due to pain CV:  RRR, no murmurs, strong pulses x 4 Resp:  Normal effort. Lungs clear to auscultation Abd:  Soft, diffusely tender to palpation, non distended, positive bowel sounds, no rebound or guarding. Rectal exam showing dark stool guaiac negative MSK:  No edema or  cyanosis Neuro:  Normal speech, face is symmetric, moving all extremities with no gross focal neuro deficit   ED Results / Procedures / Treatments   Labs (all labs ordered are listed, but only abnormal results are displayed) Labs Reviewed  COMPREHENSIVE METABOLIC PANEL - Abnormal; Notable for the following components:      Result Value   Sodium 128 (*)    CO2 19 (*)    Glucose, Bld 122 (*)    BUN 57 (*)    Creatinine, Ser 2.27 (*)    Calcium 8.2 (*)    Total Protein 6.3 (*)    Albumin 2.5 (*)    Alkaline Phosphatase 128 (*)    Total Bilirubin 2.7 (*)    GFR, Estimated 22 (*)    All other components within normal limits  CBC - Abnormal; Notable for the following components:   WBC 20.1 (*)    RBC 2.94 (*)    Hemoglobin 8.1 (*)    HCT 25.0 (*)    Platelets 99 (*)    All other components within normal limits  URINALYSIS, ROUTINE W REFLEX MICROSCOPIC - Abnormal; Notable for  the following components:   Color, Urine AMBER (*)    APPearance CLOUDY (*)    Protein, ur 30 (*)    Leukocytes,Ua SMALL (*)    Bacteria, UA MANY (*)    All other components within normal limits  PROTIME-INR - Abnormal; Notable for the following components:   Prothrombin Time 23.6 (*)    INR 2.1 (*)    All other components within normal limits  TROPONIN I (HIGH SENSITIVITY) - Abnormal; Notable for the following components:   Troponin I (High Sensitivity) 276 (*)    All other components within normal limits  RESP PANEL BY RT-PCR (FLU A&B, COVID) ARPGX2  CULTURE, BLOOD (ROUTINE X 2)  C DIFFICILE QUICK SCREEN W PCR REFLEX    CULTURE, BLOOD (ROUTINE X 2)  LIPASE, BLOOD  LACTIC ACID, PLASMA  PROCALCITONIN  TYPE AND SCREEN  TROPONIN I (HIGH SENSITIVITY)     EKG  ED ECG REPORT I, Rudene Re, the attending physician, personally viewed and interpreted this ECG.  Afib, rate 91, no STE or depression   RADIOLOGY I have personally reviewed the images performed during this visit and I agree  with the Radiologist's read.   Interpretation by Radiologist:  CT ABDOMEN PELVIS WO CONTRAST  Result Date: 06/24/2021 CLINICAL DATA:  Abdominal pain, nonlocalized. Diarrhea loss of appetite for past few days. EXAM: CT ABDOMEN AND PELVIS WITHOUT CONTRAST TECHNIQUE: Multidetector CT imaging of the abdomen and pelvis was performed following the standard protocol without IV contrast. COMPARISON:  CT examination dated October 15, 2020 FINDINGS: Lower chest: No acute abnormality. Hepatobiliary: No focal liver abnormality is seen. Status post cholecystectomy. No biliary dilatation. Pancreas: Unremarkable. No pancreatic ductal dilatation or surrounding inflammatory changes. Spleen: Normal in size without focal abnormality. Adrenals/Urinary Tract: Adrenal glands are unremarkable. Kidneys are normal, without renal calculi, focal lesion, or hydronephrosis. Bladder is unremarkable. Stomach/Bowel: Stomach is within normal limits. Appendix not visualized. No evidence of bowel wall thickening, distention, or inflammatory changes. Colonic diverticulosis without evidence of acute diverticulitis. Vascular/Lymphatic: Aortic atherosclerosis. No enlarged abdominal or pelvic lymph nodes. IVC filter is noted. Reproductive: Status post hysterectomy. Stable hypodense cystic structures in bilateral adnexal region. Other: No abdominal wall hernia or abnormality. No abdominopelvic ascites. Musculoskeletal: Degenerate disc disease of the lumbar spine with mild levoscoliosis. No acute osseous abnormality. IMPRESSION: 1. Sigmoid colonic diverticulosis without evidence of acute diverticulitis. 2. Bowel loops are normal in caliber without evidence of obstruction or significant wall thickening. 3. Status post cholecystectomy and hysterectomy. 4. 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 IVC filter is present. Electronically Signed   By: Keane Police D.O.   On: 06/24/2021 16:17   DG Chest Portable 1 View  Result Date:  06/25/2021 CLINICAL DATA:  Chest and back pain with diarrhea. EXAM: PORTABLE CHEST 1 VIEW COMPARISON:  01/15/2021 FINDINGS: 0459 hours. Stable asymmetric elevation right hemidiaphragm. Diffuse interstitial opacity again noted with new areas of patchy airspace disease in the right upper lobe and medial right base. No substantial pleural effusion. Left-sided permanent pacemaker again noted. Telemetry leads overlie the chest. IMPRESSION: Interval development of patchy airspace disease in the right upper lobe and medial right base. Multifocal pneumonia concern. Electronically Signed   By: Misty Stanley M.D.   On: 06/25/2021 05:23      PROCEDURES:  Critical Care performed: Yes, see critical care procedure note(s)  .Critical Care Performed by: Rudene Re, MD Authorized by: Rudene Re, MD   Critical care provider statement:    Critical care time (  minutes):  45   Critical care time was exclusive of:  Teaching time   Critical care was necessary to treat or prevent imminent or life-threatening deterioration of the following conditions:  Sepsis, shock, respiratory failure, renal failure, cardiac failure, circulatory failure, dehydration and metabolic crisis   Critical care was time spent personally by me on the following activities:  Development of treatment plan with patient or surrogate, discussions with consultants, evaluation of patient's response to treatment, examination of patient, ordering and review of laboratory studies, ordering and review of radiographic studies, ordering and performing treatments and interventions, pulse oximetry, re-evaluation of patient's condition, review of old charts and obtaining history from patient or surrogate   I assumed direction of critical care for this patient from another provider in my specialty: no     Care discussed with: admitting provider        IMPRESSION / MDM / [place] / ED COURSE  I reviewed the triage vital signs and the  nursing notes.  76 y.o. female with a history of atrial fibrillation on Eliquis, nonischemic cardiomyopathy with preserved EF, pulmonary artery hypertension, diabetes, hypertension, hyperlipidemia, COPD who presents for evaluation of abdominal pain.  Patient with 2 weeks of diffuse crampy abdominal pain, decreased appetite, nausea, several daily episodes of diarrhea.  Patient looks dry and uncomfortable due to pain, abdomen is nondistended with diffuse tenderness, no localized tenderness, rebound or guarding.  Has a low-grade fever.   Ddx: Gastroenteritis versus colitis versus diverticulitis versus viral syndrome versus mesenteric ischemia versus GI bleed versus SBO versus volvulus versus UTI versus pyelonephritis versus kidney stones versus appendicitis  Plan: CT abdomen pelvis, CBC, CMP, lipase, lactic acid, COVID and flu, C. difficile, EKG. patient placed on telemetry for close monitoring of cardiorespiratory status  Meds given:  MEDICATIONS ORDERED IN ED: Medications  lactated ringers bolus 2,000 mL (has no administration in time range)  fentaNYL (SUBLIMAZE) injection 12.5 mcg (12.5 mcg Intravenous Given 06/24/21 1740)  fentaNYL (SUBLIMAZE) injection 12.5 mcg (12.5 mcg Intravenous Given 06/24/21 2028)  ondansetron (ZOFRAN) injection 4 mg (4 mg Intravenous Given 06/24/21 2350)  morphine 4 MG/ML injection 4 mg (4 mg Intravenous Given 06/24/21 2350)    ED COURSE: CT visualized by me with no acute findings, read confirmed by radiology.  Patient has white count of 20 with new thrombocytopenia and platelets of 99.  Acute on chronic kidney injury with a creatinine of 2.27 patient's baseline is a 1.  Mild hyponatremia with a sodium of 128, LFTs and lipase are normal.  Worsening anemia with downtrending hemoglobin, rectal exam negative for blood.  Patient does endorse some episodes of dark stools and with her being on a blood thinner the source of blood loss could be a slow GI bleed.  No signs of bleeding right  now.  UA, lactic acid, procalcitonin and blood cultures are still pending.  Aggressive IV hydration has been initiated with 2 L bolus.  Patient received IV fentanyl and morphine for pain, IV Zofran for nausea.  _________________________ 5:41 AM on 06/25/2021 ----------------------------------------- Patient started to become hypoxic and was placed on 4 L nasal cannula.  A chest x-ray was done showing multifocal pneumonia, reviewed by me and confirmed by radiology.  Patient met sepsis criteria with fever, tachycardia, tachypnea.  Patient was started on cefepime and Vanco and azithromycin.  Aggressive IV hydration started based on sepsis protocol.  Procalcitonin elevated 18.79.  COVID and flu negative.  Lactic's normal.  Patient's first troponin is elevated at 276.  EKG shows A. fib with well-controlled rate.  Most likely demand ischemia in the setting of sepsis, severe dehydration, AKI, and hypoxia.  Second troponin is pending.  Will consider heparin if troponin is trending up.  _________________________ 6:54 AM on 06/25/2021 ----------------------------------------- Patient's respiratory status declining, increasing work of breathing, more crackly on exam.  Fluids were stopped and patient was transitioned to BiPAP.  Will redose 3 duo nebs and give 20 mg of IV Lasix.  Blood pressure is stable now at 140/53.  Hospitalist service updated.  Consults: Hospitalist service was consulted and after discussion of the case they accepted patient for admission.  EMR reviewed including patient's last echocardiogram from March 2022 showing normal EF, prior to IV fluid administration.  I also reviewed patient's last visit with her primary care doctor from December 2022 when she was seen for an upper respiratory infection and diagnosed with influenza.  History is gathered from patient and her daughters who were at bedside.  Plan discussed with all of them.        FINAL CLINICAL IMPRESSION(S) / ED DIAGNOSES    Final diagnoses:  Sepsis with acute organ dysfunction without septic shock, due to unspecified organism, unspecified type ([place])  Acute respiratory failure with hypoxia ([place])  Multifocal pneumonia  AKI (acute kidney injury) ([place])  Demand ischemia ([place])  Hyponatremia  Thrombocytopenia ([place])  Acute on chronic anemia     Rx / DC Orders   ED Discharge Orders     None        Note:  This document was prepared using Dragon voice recognition software and may include unintentional dictation errors.    Alfred Levins, [place], MD 06/25/21 [place], [place], MD 06/25/21 [place], [place], MD 06/25/21 [place], [place], MD 06/25/21 [phone]

## 2021-06-25 NOTE — Sepsis Progress Note (Signed)
Monitoring for the code sepsis protocol. °

## 2021-06-25 NOTE — ED Notes (Signed)
Dr. Alfred Levins at the bedside, RT called for Bipap. Readjusted BP cuff to obtain a better pressure.

## 2021-06-25 NOTE — ED Notes (Signed)
RT called regarding placing Cpap on patient when she is ready to sleep.

## 2021-06-25 NOTE — ED Notes (Signed)
Paper consent obtain see paper chart.

## 2021-06-25 NOTE — ED Notes (Signed)
Lab called to obtain labs MD is requesting

## 2021-06-25 NOTE — ED Notes (Signed)
Report received from [place], [place]

## 2021-06-25 NOTE — ED Notes (Deleted)
Lab called to obtain repeat lactic

## 2021-06-25 NOTE — Progress Notes (Signed)
PHARMACY - PHYSICIAN COMMUNICATION CRITICAL VALUE ALERT - BLOOD CULTURE IDENTIFICATION (BCID)  Debra Saunders is an 76 y.o. female who presented to [facility] on 06/25/2021 with a chief complaint of not feeling well following flu-like symptoms 3 weeks ago.    Assessment:  1/3 blood cultures with GPC in 3/4 bottles, BCID detects streptococcus species. Patient on vancomycin and cefepime.    Name of physician (or Provider) Contacted: Dr Blaine Hamper  Current antibiotics: vancomycin and cefepime  Changes to prescribed antibiotics recommended:  Recommendations accepted by provider - change to ceftriaxone  Results for orders placed or performed during the hospital encounter of 06/25/21  Blood Culture ID Panel (Reflexed) (Collected: 06/25/2021  2:33 AM)  Result Value Ref Range   Enterococcus faecalis NOT DETECTED NOT DETECTED   Enterococcus Faecium NOT DETECTED NOT DETECTED   Listeria monocytogenes NOT DETECTED NOT DETECTED   Staphylococcus species NOT DETECTED NOT DETECTED   Staphylococcus aureus (BCID) NOT DETECTED NOT DETECTED   Staphylococcus epidermidis NOT DETECTED NOT DETECTED   Staphylococcus lugdunensis NOT DETECTED NOT DETECTED   Streptococcus species DETECTED (A) NOT DETECTED   Streptococcus agalactiae NOT DETECTED NOT DETECTED   Streptococcus pneumoniae NOT DETECTED NOT DETECTED   Streptococcus pyogenes NOT DETECTED NOT DETECTED   A.calcoaceticus-baumannii NOT DETECTED NOT DETECTED   Bacteroides fragilis NOT DETECTED NOT DETECTED   Enterobacterales NOT DETECTED NOT DETECTED   Enterobacter cloacae complex NOT DETECTED NOT DETECTED   Escherichia coli NOT DETECTED NOT DETECTED   Klebsiella aerogenes NOT DETECTED NOT DETECTED   Klebsiella oxytoca NOT DETECTED NOT DETECTED   Klebsiella pneumoniae NOT DETECTED NOT DETECTED   Proteus species NOT DETECTED NOT DETECTED   Salmonella species NOT DETECTED NOT DETECTED   Serratia marcescens NOT DETECTED NOT DETECTED   Haemophilus influenzae NOT  DETECTED NOT DETECTED   Neisseria meningitidis NOT DETECTED NOT DETECTED   Pseudomonas aeruginosa NOT DETECTED NOT DETECTED   Stenotrophomonas maltophilia NOT DETECTED NOT DETECTED   Candida albicans NOT DETECTED NOT DETECTED   Candida auris NOT DETECTED NOT DETECTED   Candida glabrata NOT DETECTED NOT DETECTED   Candida krusei NOT DETECTED NOT DETECTED   Candida parapsilosis NOT DETECTED NOT DETECTED   Candida tropicalis NOT DETECTED NOT DETECTED   Cryptococcus neoformans/gattii NOT DETECTED NOT DETECTED    Doreene Eland, PharmD, BCPS, BCIDP Work Cell: [phone] 06/25/2021 2:52 PM

## 2021-06-25 NOTE — ED Notes (Signed)
IV team at the bedside. 

## 2021-06-25 NOTE — Sepsis Progress Note (Signed)
Messaged bedside RN E. Shell via secure chat regarding second set of blood cultures.  Epic shows 1 set drawn at 0233.  Per RN, will draw second set & send to lab.  Antibiotics given prior to second set being drawn.

## 2021-06-25 NOTE — H&P (Signed)
This  History and Physical    Debra Saunders RJJ:[id number] DOB: November 29, 1945 DOA: 06/25/2021  Referring MD/NP/PA:   PCP: [name], MD   Patient coming from:  The patient is coming from home.    Chief Complaint: Shortness of breath, cough, fever, chills, burning on urination, diarrhea and abdominal pain  HPI: Debra Saunders is a 76 y.o. female with medical history significant of hypertension, hyperlipidemia, diabetes mellitus, depression, subdural hematoma 2014, sinoatrial node dysfunction (s/p for pacemaker placement, OSA, PAH, interstitial lung disease on 4 L oxygen, dCHF, atrial fibrillation on Eliquis, CKD stage IIIa, cardiorenal syndrome, anemia, DVT on Eliquis, who presents with short breath, cough, fever, chills, burning on urination, dysuria.   Patient states that she has been sick for almost 3 weeks.  She has cough with some mucus production, shortness breath, fever, chills.  Denies chest pain.  Per her daughter (I called her daughter by phone), patient has chronic diarrhea intermittently, but her diarrhea seems to be worse now.  She also complains of some cramping diffuse abdominal pain.  She had 1 episode of dark stool yesterday, not sure if it was blood per her daughter.  Patient has poor appetite and decreased oral intake.  She has burning on urination, no dysuria or frequency.  No unilateral numbness or weakness.   Patient was initially hypotensive with blood pressure 73/26, which improved to 103/54 after giving 2 L LR bolus in ED.  Patient was found to have oxygen desaturation to 87% on home 4 L oxygen, patient has severe respiratory distress, using accessory muscle for breathing.  She was started on BiPAP in ED. Per patient's daughter, patient does not have a history of COPD.  ED Course: pt was found to have WBC 20.1, Hgb 9.0 on 04/29/21 --> 8.1 --> then 7.0, negative COVID PCR, INR 2.1, negative FOBT per ED physician's note, lactic acid 1.0, troponin level 276, lipase 45,  urinalysis (cloudy appearance, small amount of leukocyte, many bacteria, WBC 6-10), sodium 128, worsening renal function, temperature 100.1, heart rate of 131, 90, RR 31, 27.  Chest x-ray showed multifocal infiltration.  CT abdomen/pelvis is negative for acute intra-abdominal issues.  Patient is admitted to progressive bed as inpatient.  Addendum: blood culture has positive GPC in 3/4 bottles, BCID detected streptococcus species.  Review of Systems:   General: no fevers, chills, no body weight gain, has poor appetite, has fatigue HEENT: no blurry vision, hearing changes or sore throat Respiratory: has dyspnea, coughing, no wheezing CV: no chest pain, no palpitations GI: no nausea, vomiting, has abdominal pain, diarrhea, no constipation GU: no dysuria, has burning on urination, no increased urinary frequency, hematuria  Ext: no leg edema Neuro: no unilateral weakness, numbness, or tingling, no vision change or hearing loss Skin: no rash, no skin tear. MSK: No muscle spasm, no deformity, no limitation of range of movement in spin Heme: No easy bruising.  Travel history: No recent long distant travel.  Allergy:  Allergies  Allergen Reactions   Aspirin Hives and Other (See Comments)    Pt states that she is unable to take because she had bleeding in her brain   Other Other (See Comments)    Pt states that she is unable to take blood thinners because she had bleeding in her brain; Blood Thinners-per doctor at [facility] (Xarelto)   Penicillins Hives, Shortness Of Breath, Swelling and Other (See Comments)    Has patient had a PCN reaction causing immediate rash, facial/tongue/throat swelling, SOB  or lightheadedness with hypotension: Yes Has patient had a PCN reaction causing severe rash involving mucus membranes or skin necrosis: No Has patient had a PCN reaction that required hospitalization No Has patient had a PCN reaction occurring within the last 10 years: No If all of the above answers  are "NO", then may proceed with Cephalosporin use.    Past Medical History:  Diagnosis Date   Arthritis    Atrial fibrillation, persistent ([place])    a. s/p TEE-guided DCCV on 08/20/2017   Cardiorenal syndrome    a. 09/2017 Creat rose to 2.7 w/ diuresis-->HD x 2.   Chest pain    a. 01/2012 Cath: nonobs dzs;  c. 04/2014 MV: No ischemia.   Chronic back pain    Chronic combined systolic (congestive) and diastolic (congestive) heart failure ([place])    a. Dx 2005 @ [facility];  b. 02/2014 Echo: EF 55-60%; c. 07/2017: EF 30-35%, diffuse HK, trivial AI, severe MR, moderate TR; d. 09/2017 Echo: EF 40-45%; e. 02/2018 Echo: EF 55-60%, Gr1 DD; f. 08/2019 Echo: EF 60-65%, Gr2 DD, Nl RV fxn, RVSP 49.21mmHg. Mod-Sev MR. Mild to mod TR. Mod PR. RA ~ 22mmHg.   Chronic respiratory failure (HCC)    a. on home O2   Gastritis    History of intracranial hemorrhage    a. 6/14 described by neurosurgery as "small frontal posttraumatic SAH " - pt was on xarelto @ the time.   Hyperlipidemia    Hypertension    Interstitial lung disease (HCC)    Lipoma of back    Lymphedema    a. Chronic LLE edema.   NICM (nonischemic cardiomyopathy) ([place])    a. 09/2017 Echo: EF 40-45%; b. 09/2017 Cath: minor, insignificant CAD; c. 02/2018 Echo: EF 55-60%, Gr1 DD; d. 08/2019 Echo: EF 60-65%, Gr2 DD.   Obesity    PAH (pulmonary artery hypertension) ([place])    a. 09/2017 Echo: PASP 72mmHg; b. 09/2017 RHC: RA 23, RV 84/13, PA 84/29, PCWP 20, CO 3.89, CI 1.89. LVEDP 18.   Presence of permanent cardiac pacemaker 01/09/2014   Sepsis with metabolic encephalopathy ([place])    Sleep apnea    Streptococcal bacteremia    Stroke [facility]) 2014   Subarachnoid hemorrhage ([place]) 01/19/2013   Symptomatic bradycardia    a. s/p BSX PPM.   Thyroid nodule    Type II diabetes mellitus ([place])    Urinary incontinence     Past Surgical History:  Procedure Laterality Date   ABDOMINAL HYSTERECTOMY  1969   BREAST BIOPSY Right 10/28/2019   lymph node bx, [place] Bx, pending path     CARDIAC CATHETERIZATION  2013   @ [place]: No obstructive CAD: Only 20% ostial left circumflex.   CARDIOVERSION N/A 10/12/2017   Procedure: CARDIOVERSION;  Surgeon: Minna Merritts, MD;  Location: [facility] ORS;  Service: Cardiovascular;  Laterality: N/A;   CATARACT EXTRACTION W/PHACO Right 09/19/2020   Procedure: CATARACT EXTRACTION PHACO AND INTRAOCULAR LENS PLACEMENT ([facility]) RIGHT DIABETIC 3.95 00:43.0 9.2%;  Surgeon: Leandrew Koyanagi, MD;  Location: [place];  Service: Ophthalmology;  Laterality: Right;  Pt requests to be last sleep apnea   CATARACT EXTRACTION W/PHACO Left 10/03/2020   Procedure: CATARACT EXTRACTION PHACO AND INTRAOCULAR LENS PLACEMENT ([place]) LEFT DIABETIC;  Surgeon: Leandrew Koyanagi, MD;  Location: [place];  Service: Ophthalmology;  Laterality: Left;  3.25 0:59.3 5.5%   CHOLECYSTECTOMY     COLONOSCOPY WITH PROPOFOL N/A 09/10/2018   Procedure: COLONOSCOPY WITH PROPOFOL;  Surgeon: Manya Silvas, MD;  Location: [facility] ENDOS[facility];  Service: Endoscopy;  Laterality: N/A;   CYST REMOVAL TRUNK  2002   BACK   DIALYSIS/PERMA CATHETER INSERTION N/A 10/06/2017   Procedure: DIALYSIS/PERMA CATHETER INSERTION;  Surgeon: Katha Cabal, MD;  Location: [place] CV LAB;  Service: Cardiovascular;  Laterality: N/A;   INSERT / REPLACE / REMOVE PACEMAKER     lipoma removal  2014   back   PACEMAKER INSERTION  2015   [employer] dual chamber pacemaker implanted by Dr Caryl Comes for symptomatic bradycardia   PERMANENT PACEMAKER INSERTION N/A 01/09/2014   Procedure: PERMANENT PACEMAKER INSERTION;  Surgeon: Deboraha Sprang, MD;  Location: [facility] CATH LAB;  Service: Cardiovascular;  Laterality: N/A;   RIGHT HEART CATH N/A 04/19/2018   Procedure: RIGHT HEART CATH;  Surgeon: Larey Dresser, MD;  Location: [place] CV LAB;  Service: Cardiovascular;  Laterality: N/A;   RIGHT HEART CATH N/A 08/30/2020   Procedure: RIGHT HEART CATH;  Surgeon: Larey Dresser, MD;   Location: [place] CV LAB;  Service: Cardiovascular;  Laterality: N/A;   RIGHT/LEFT HEART CATH AND CORONARY ANGIOGRAPHY N/A 10/19/2017   Procedure: RIGHT/LEFT HEART CATH AND CORONARY ANGIOGRAPHY;  Surgeon: Wellington Hampshire, MD;  Location: [place] CV LAB;  Service: Cardiovascular;  Laterality: N/A;   TEE WITHOUT CARDIOVERSION N/A 08/20/2017   Procedure: TRANSESOPHAGEAL ECHOCARDIOGRAM (TEE) & Direct current cardioversion;  Surgeon: Minna Merritts, MD;  Location: [facility] OR[facility];  Service: Cardiovascular;  Laterality: N/A;   TRIGGER FINGER RELEASE      Social History:  reports that she quit smoking about 31 years ago. Her smoking use included cigarettes. She has a 0.60 pack-year smoking history. She has never used smokeless tobacco. She reports that she does not drink alcohol and does not use drugs.  Family History:  Family History  Problem Relation Age of Onset   Heart disease Mother    Hypertension Mother    COPD Mother        was a smoker   Thyroid disease Mother    Hypertension Father    Hypertension Sister    Thyroid disease Sister    Breast cancer Sister    Hypertension Sister    Thyroid disease Sister    Bone cancer Sister    Breast cancer Maternal Aunt    Kidney disease Neg Hx    Bladder Cancer Neg Hx      Prior to Admission medications   Medication Sig Start Date End Date Taking? Authorizing Provider  acetaminophen (TYLENOL) 325 MG tablet Take 325-650 mg by mouth every 6 (six) hours as needed for mild pain. Reported on 10/10/2015    [provider]  albuterol (ACCUNEB) 0.63 MG/3ML nebulizer solution Take 1 ampule by nebulization every 6 (six) hours as needed for wheezing.    [provider]  apixaban (ELIQUIS) 5 MG TABS tablet TAKE 1 TABLET BY MOUTH TWICE DAILY . APPOINTMENT REQUIRED FOR FUTURE REFILLS 03/22/21   Wellington Hampshire, MD  cholestyramine Lucrezia Starch) 4 GM/DOSE powder Take 4 g by mouth daily. 08/03/20   [provider]  digoxin (LANOXIN)  0.125 MG tablet Take 1 tablet (125 mcg total) by mouth daily. 09/03/20   Larey Dresser, MD  DULoxetine (CYMBALTA) 30 MG capsule TAKE 1 CAPSULE BY MOUTH ONCE DAILY FOR 14 DAYS, THEN INCREASE TO 2 CAPSULES ONCE DAILY 06/06/21   [name], MD  DUREZOL 0.05 % EMUL  01/05/21   [provider]  gabapentin (NEURONTIN) 300 MG capsule Take 1  capsule (300 mg total) by mouth every morning AND 1 capsule (300 mg total) daily after lunch AND 2 capsules (600 mg total) at bedtime. 05/24/21   [name], MD  KLOR-CON M20 20 MEQ tablet Take 1 tablet by mouth once daily 09/24/20   Larey Dresser, MD  Lancets MISC Use with True Metrix glucometer to check sugars BID 11/14/19   [name], MD  macitentan (OPSUMIT) 10 MG tablet Take 1 tablet (10 mg total) by mouth daily. 01/24/21   Larey Dresser, MD  meclizine (ANTIVERT) 25 MG tablet Take 1 tablet (25 mg total) by mouth 3 (three) times daily as needed for dizziness. 12/25/20   Larey Dresser, MD  Menthol, Topical Analgesic, (BIOFREEZE EX) Apply 1 application topically 4 (four) times daily as needed (for pain).    [provider]  ondansetron (ZOFRAN) 8 MG tablet Take 1 tablet (8 mg total) by mouth every 8 (eight) hours as needed for nausea or vomiting. 09/25/14   Jackolyn Confer, MD  OXYGEN Inhale 4 L into the lungs continuous.    [provider]  rosuvastatin (CRESTOR) 10 MG tablet Take 1 tablet by mouth once daily 06/06/21   Larey Dresser, MD  Selexipag (UPTRAVI) 1600 MCG TABS Take 1 tablet (1,600 mcg total) by mouth 2 (two) times daily. 04/19/21   Larey Dresser, MD  sildenafil (REVATIO) 20 MG tablet Take 1 tablet (20 mg total) by mouth 3 (three) times daily. 01/03/21   Larey Dresser, MD  spironolactone (ALDACTONE) 25 MG tablet TAKE 1 TABLET BY MOUTH AT BEDTIME.  PLEASE CALL FOR OFFICE VISIT [phone] 02/04/21   Larey Dresser, MD  torsemide (DEMADEX) 20 MG tablet Take 40 mg by mouth 2 (two) times daily.     [provider]  TRUE METRIX BLOOD GLUCOSE TEST test strip CHECK BLOOD GLUCOSE TWICE DAILY 11/21/20   [name], MD    Physical Exam: Vitals:   06/25/21 1540 06/25/21 1704 06/25/21 1730 06/25/21 1732  BP: (!) 102/50 (!) 94/50 (!) 88/53 (!) 88/53  Pulse: 70 70 70 70  Resp: 17 (!) 26 (!) 22 (!) 22  Temp:  97.8 F (36.6 C)  98.2 F (36.8 C)  TempSrc:  Axillary  Axillary  SpO2: 97% 100% 99% 97%  Weight:       General: Has acute respiratory distress HEENT:       Eyes: PERRL, EOMI, no scleral icterus.       ENT: No discharge from the ears and nose, no pharynx injection, no tonsillar enlargement.        Neck: No JVD, no bruit, no mass felt. Heme: No neck lymph node enlargement. Cardiac: S1/S2, RRR, No murmurs, No gallops or rubs. Respiratory: Has rhonchi and crackles bilaterally GI: Soft, nondistended, has mild diffused tenderness, no rebound pain, no organomegaly, BS present. GU: No hematuria Ext: No pitting leg edema bilaterally. 1+DP/PT pulse bilaterally. Musculoskeletal: No joint deformities, No joint redness or warmth, no limitation of ROM in spin. Skin: No rashes.  Neuro: Alert, oriented X3, cranial nerves II-XII grossly intact, moves all extremities normally.  Psych: Patient is not psychotic, no suicidal or hemocidal ideation.  Labs on Admission: I have personally reviewed following labs and imaging studies  CBC: Recent Labs  Lab 06/24/21 1456 06/25/21 1350  WBC 20.1* 18.3*  HGB 8.1* 7.0*  HCT 25.0* 22.0*  MCV 85.0 85.9  PLT 99* 75*   Basic Metabolic Panel: Recent Labs  Lab 06/24/21  1456 06/25/21 1350  NA 128* 126*  K 4.2 4.9  CL 101 98  CO2 19* 20*  GLUCOSE 122* 171*  BUN 57* 74*  CREATININE 2.27* 3.11*  CALCIUM 8.2* 7.9*   GFR: Estimated Creatinine Clearance: 17.2 mL/min (A) (by C-G formula based on SCr of 3.11 mg/dL (H)). Liver Function Tests: Recent Labs  Lab 06/24/21 1456  AST 19  ALT 12  ALKPHOS 128*  BILITOT 2.7*  PROT 6.3*   ALBUMIN 2.5*   Recent Labs  Lab 06/24/21 1456  LIPASE 45   No results for input(s): AMMONIA in the last 168 hours. Coagulation Profile: Recent Labs  Lab 06/25/21 0258  INR 2.1*   Cardiac Enzymes: No results for input(s): CKTOTAL, CKMB, CKMBINDEX, TROPONINI in the last 168 hours. BNP (last 3 results) No results for input(s): PROBNP in the last 8760 hours. HbA1C: No results for input(s): HGBA1C in the last 72 hours. CBG: No results for input(s): GLUCAP in the last 168 hours. Lipid Profile: No results for input(s): CHOL, HDL, LDLCALC, TRIG, CHOLHDL, LDLDIRECT in the last 72 hours. Thyroid Function Tests: No results for input(s): TSH, T4TOTAL, FREET4, T3FREE, THYROIDAB in the last 72 hours. Anemia Panel: No results for input(s): VITAMINB12, FOLATE, FERRITIN, TIBC, IRON, RETICCTPCT in the last 72 hours. Urine analysis:    Component Value Date/Time   COLORURINE AMBER (A) 06/25/2021 0610   APPEARANCEUR CLOUDY (A) 06/25/2021 0610   APPEARANCEUR Clear 11/18/2013 0443   LABSPEC 1.016 06/25/2021 0610   LABSPEC 1.049 11/18/2013 0443   PHURINE 5.0 06/25/2021 0610   GLUCOSEU NEGATIVE 06/25/2021 0610   GLUCOSEU Negative 11/18/2013 0443   HGBUR NEGATIVE 06/25/2021 0610   BILIRUBINUR NEGATIVE 06/25/2021 0610   BILIRUBINUR neg 11/04/2019 1526   BILIRUBINUR Negative 11/18/2013 0443   KETONESUR NEGATIVE 06/25/2021 0610   PROTEINUR 30 (A) 06/25/2021 0610   UROBILINOGEN 0.2 11/04/2019 1526   NITRITE NEGATIVE 06/25/2021 0610   LEUKOCYTESUR SMALL (A) 06/25/2021 0610   LEUKOCYTESUR Negative 11/18/2013 0443   Sepsis Labs: @LABRCNTIP (procalcitonin:4,lacticidven:4) ) Recent Results (from the past 240 hour(s))  Blood culture (routine x 2)     Status: None (Preliminary result)   Collection Time: 06/25/21  2:33 AM   Specimen: BLOOD RIGHT FOREARM  Result Value Ref Range Status   Specimen Description BLOOD RIGHT FOREARM  Final   Special Requests   Final    BOTTLES DRAWN AEROBIC AND  ANAEROBIC Blood Culture results may not be optimal due to an inadequate volume of blood received in culture bottles   Culture  Setup Time   Final    GRAM POSITIVE COCCI IN BOTH AEROBIC AND ANAEROBIC BOTTLES Organism ID to follow CRITICAL RESULT CALLED TO, READ BACK BY AND VERIFIED WITHArdeen Garland PHARMD [id number] 06/25/21 HNM Performed at [facility], [address] [place], [place] [address]    Culture GRAM POSITIVE COCCI  Final   Report Status PENDING  Incomplete  Blood Culture ID Panel (Reflexed)     Status: Abnormal   Collection Time: 06/25/21  2:33 AM  Result Value Ref Range Status   Enterococcus faecalis NOT DETECTED NOT DETECTED Final   Enterococcus Faecium NOT DETECTED NOT DETECTED Final   Listeria monocytogenes NOT DETECTED NOT DETECTED Final   Staphylococcus species NOT DETECTED NOT DETECTED Final   Staphylococcus aureus (BCID) NOT DETECTED NOT DETECTED Final   Staphylococcus epidermidis NOT DETECTED NOT DETECTED Final   Staphylococcus lugdunensis NOT DETECTED NOT DETECTED Final   Streptococcus species DETECTED (A) NOT DETECTED Final  Comment: Not Enterococcus species, Streptococcus agalactiae, Streptococcus pyogenes, or Streptococcus pneumoniae. CRITICAL RESULT CALLED TO, READ BACK BY AND VERIFIED WITH: [place] [id number] 06/25/21 HNM    Streptococcus agalactiae NOT DETECTED NOT DETECTED Final   Streptococcus pneumoniae NOT DETECTED NOT DETECTED Final   Streptococcus pyogenes NOT DETECTED NOT DETECTED Final   A.calcoaceticus-baumannii NOT DETECTED NOT DETECTED Final   Bacteroides fragilis NOT DETECTED NOT DETECTED Final   Enterobacterales NOT DETECTED NOT DETECTED Final   Enterobacter cloacae complex NOT DETECTED NOT DETECTED Final   Escherichia coli NOT DETECTED NOT DETECTED Final   Klebsiella aerogenes NOT DETECTED NOT DETECTED Final   Klebsiella oxytoca NOT DETECTED NOT DETECTED Final   Klebsiella pneumoniae NOT DETECTED NOT DETECTED Final   Proteus  species NOT DETECTED NOT DETECTED Final   Salmonella species NOT DETECTED NOT DETECTED Final   Serratia marcescens NOT DETECTED NOT DETECTED Final   Haemophilus influenzae NOT DETECTED NOT DETECTED Final   Neisseria meningitidis NOT DETECTED NOT DETECTED Final   Pseudomonas aeruginosa NOT DETECTED NOT DETECTED Final   Stenotrophomonas maltophilia NOT DETECTED NOT DETECTED Final   Candida albicans NOT DETECTED NOT DETECTED Final   Candida auris NOT DETECTED NOT DETECTED Final   Candida glabrata NOT DETECTED NOT DETECTED Final   Candida krusei NOT DETECTED NOT DETECTED Final   Candida parapsilosis NOT DETECTED NOT DETECTED Final   Candida tropicalis NOT DETECTED NOT DETECTED Final   Cryptococcus neoformans/gattii NOT DETECTED NOT DETECTED Final    Comment: Performed at [facility], [address] [place], [place] [address]  Resp Panel by RT-PCR (Flu A&B, Covid) Nasopharyngeal Swab     Status: None   Collection Time: 06/25/21  3:33 AM   Specimen: Nasopharyngeal Swab; Nasopharyngeal(NP) swabs in vial transport medium  Result Value Ref Range Status   SARS Coronavirus 2 by RT PCR NEGATIVE NEGATIVE Final    Comment: (NOTE) SARS-CoV-2 target nucleic acids are NOT DETECTED.  The SARS-CoV-2 RNA is generally detectable in upper respiratory specimens during the acute phase of infection. The lowest concentration of SARS-CoV-2 viral copies this assay can detect is 138 copies/mL. A negative result does not preclude SARS-Cov-2 infection and should not be used as the sole basis for treatment or other patient management decisions. A negative result may occur with  improper specimen collection/handling, submission of specimen other than nasopharyngeal swab, presence of viral mutation(s) within the areas targeted by this assay, and inadequate number of viral copies(<138 copies/mL). A negative result must be combined with clinical observations, patient history, and  epidemiological information. The expected result is Negative.  Fact Sheet for Patients:  EntrepreneurPulse.com.au  Fact Sheet for Healthcare Providers:  IncredibleEmployment.be  This test is no t yet approved or cleared by the [place] FDA and  has been authorized for detection and/or diagnosis of SARS-CoV-2 by FDA under an Emergency Use Authorization (EUA). This EUA will remain  in effect (meaning this test can be used) for the duration of the COVID-19 declaration under Section 564(b)(1) of the Act, 21 U.S.C.section 360bbb-3(b)(1), unless the authorization is terminated  or revoked sooner.       Influenza A by PCR NEGATIVE NEGATIVE Final   Influenza B by PCR NEGATIVE NEGATIVE Final    Comment: (NOTE) The Xpert Xpress SARS-CoV-2/FLU/RSV plus assay is intended as an aid in the diagnosis of influenza from Nasopharyngeal swab specimens and should not be used as a sole basis for treatment. Nasal washings and aspirates are unacceptable for Xpert Xpress SARS-CoV-2/FLU/RSV testing.  Fact Sheet for Patients: EntrepreneurPulse.com.au  Fact Sheet for Healthcare Providers: IncredibleEmployment.be  This test is not yet approved or cleared by the [place] FDA and has been authorized for detection and/or diagnosis of SARS-CoV-2 by FDA under an Emergency Use Authorization (EUA). This EUA will remain in effect (meaning this test can be used) for the duration of the COVID-19 declaration under Section 564(b)(1) of the Act, 21 U.S.C. section 360bbb-3(b)(1), unless the authorization is terminated or revoked.  Performed at [facility], [address] [place], [place] [address]   Blood culture (routine x 2)     Status: None (Preliminary result)   Collection Time: 06/25/21  5:55 AM   Specimen: BLOOD  Result Value Ref Range Status   Specimen Description BLOOD LEFT ARM  Final   Special Requests    Final    BOTTLES DRAWN AEROBIC AND ANAEROBIC Blood Culture adequate volume   Culture  Setup Time   Final    GRAM POSITIVE COCCI IN BOTH AEROBIC AND ANAEROBIC BOTTLES CRITICAL VALUE NOTED.  VALUE IS CONSISTENT WITH PREVIOUSLY REPORTED AND CALLED VALUE. Performed at [facility], [address] [place], [place] [address]    Culture [facility] POSITIVE COCCI  Final   Report Status PENDING  Incomplete     Radiological Exams on Admission: CT ABDOMEN PELVIS WO CONTRAST  Result Date: 06/24/2021 CLINICAL DATA:  Abdominal pain, nonlocalized. Diarrhea loss of appetite for past few days. EXAM: CT ABDOMEN AND PELVIS WITHOUT CONTRAST TECHNIQUE: Multidetector CT imaging of the abdomen and pelvis was performed following the standard protocol without IV contrast. COMPARISON:  CT examination dated October 15, 2020 FINDINGS: Lower chest: No acute abnormality. Hepatobiliary: No focal liver abnormality is seen. Status post cholecystectomy. No biliary dilatation. Pancreas: Unremarkable. No pancreatic ductal dilatation or surrounding inflammatory changes. Spleen: Normal in size without focal abnormality. Adrenals/Urinary Tract: Adrenal glands are unremarkable. Kidneys are normal, without renal calculi, focal lesion, or hydronephrosis. Bladder is unremarkable. Stomach/Bowel: Stomach is within normal limits. Appendix not visualized. No evidence of bowel wall thickening, distention, or inflammatory changes. Colonic diverticulosis without evidence of acute diverticulitis. Vascular/Lymphatic: Aortic atherosclerosis. No enlarged abdominal or pelvic lymph nodes. IVC filter is noted. Reproductive: Status post hysterectomy. Stable hypodense cystic structures in bilateral adnexal region. Other: No abdominal wall hernia or abnormality. No abdominopelvic ascites. Musculoskeletal: Degenerate disc disease of the lumbar spine with mild levoscoliosis. No acute osseous abnormality. IMPRESSION: 1. Sigmoid colonic diverticulosis without  evidence of acute diverticulitis. 2. Bowel loops are normal in caliber without evidence of obstruction or significant wall thickening. 3. Status post cholecystectomy and hysterectomy. 4. 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 IVC filter is present. Electronically Signed   By: Keane Police D.O.   On: 06/24/2021 16:17   DG Chest Portable 1 View  Result Date: 06/25/2021 CLINICAL DATA:  Chest and back pain with diarrhea. EXAM: PORTABLE CHEST 1 VIEW COMPARISON:  01/15/2021 FINDINGS: 0459 hours. Stable asymmetric elevation right hemidiaphragm. Diffuse interstitial opacity again noted with new areas of patchy airspace disease in the right upper lobe and medial right base. No substantial pleural effusion. Left-sided permanent pacemaker again noted. Telemetry leads overlie the chest. IMPRESSION: Interval development of patchy airspace disease in the right upper lobe and medial right base. Multifocal pneumonia concern. Electronically Signed   By: Misty Stanley M.D.   On: 06/25/2021 05:23     EKG: I have personally reviewed.  Atrial fibrillation, mild T wave inversion diffusely   Assessment/Plan Principal Problem:  Severe sepsis with septic shock (HCC) Active Problems:   Pulmonary hypertension, unspecified (HCC)   Acute on chronic respiratory failure with hypoxia (HCC)   Normocytic anemia   Interstitial lung disease (HCC)   UTI (urinary tract infection)   Atrial fibrillation (HCC)   Multifocal pneumonia   HTN (hypertension)   HLD (hyperlipidemia)   Chronic diastolic CHF (congestive heart failure) (HCC)   Acute renal failure superimposed on stage 3a chronic kidney disease (HCC)   DVT (deep venous thrombosis) (HCC)   Hyponatremia   Thrombocytopenia (HCC)   Diarrhea   Bacteremia due to Gram-positive bacteria   GI bleeding   Black stool   Severe sepsis with septic shock due to multifocal pneumonia, UTI and bacteremia due to Gram-positive bacteria: Patient has leukocytosis  with WBC 20.1, tachycardia with heart rate up to 131, tachypnea with RR 31, fever of 100.1.  Patient was initially hypotensive with blood pressure 73/26, which improved with fluid resuscitation, but blood pressure dropped again to SBP 80s.  Since patient has diastolic congestive heart failure with elevated BNP 344, cannot give patient aggressive IV fluid resuscitation.  Levophed is started.  Consulted Dr. Lanney Gins of ICU.  - Will admit to SDU as inpt - continue Levophed - switch IV Vancomycin and cefepime to Rocephin (patient received 1 dose of azithromycin in ED)  - Mucinex for cough  - Bronchodilators - Urine legionella and S. pneumococcal antigen - Follow up blood culture x2, sputum culture and respiratory virus panel, plus Flu pcr - will get Procalcitonin and trend lactic acid level per sepsis protocol - IVF: 2L of LR  Acute on chronic respiratory failure with hypoxia due to multifocal pneumonia in the setting of interstitial lung disease -Patient is on BiPAP -Bronchodilators  Pulmonary hypertension, unspecified (HCC) -Hold Sidenafil, Macitentan and Selexigap due to hypotension and septic shock  Black stool, possible GI bleeding and hx of normocytic anemia: Patient reports 1 episode of black stool.  Her hemoglobin dropped from 9.0-8.1, then further to 7.0. consulted Dr. Vicente Males of GI. - hold Eliquis - transfuse 1 units of blood now - IVF:  as above - Start IV pantoprazole 40 mg bid - Zofran IV for nausea - Avoid NSAIDs and SQ heparin - Maintain IV access (2 large bore IVs if possible). - Monitor closely and follow q6h cbc, transfuse as necessary, if Hgb<7.0 - LaB: INR, PTT and type screen  UTI (urinary tract infection) -on Rocephin -f/u urine culture  Atrial fibrillation ([place]): HR 311 --> 90s. -Hold Eliquis -hold digoxin due to worsening renal function  HTN (hypertension) -Hold torsemide and spironolactone  HLD (hyperlipidemia) -Crestor  Chronic diastolic CHF (congestive  heart failure) ([place]): 2D echo on 09/12/2019 showed EF 60 to 65%.  BNP is elevated 344, but patient does not have leg edema, does not seem to have CHF exacerbation -Hold spironolactone and torsemide due to hypotension and worsening renal function  Acute renal failure superimposed on stage 3a chronic kidney disease ([place]): Likely multifactorial etiology, including UTI, dehydration with continuation of diuretics, possible ATN due to hypotension -Hold diuretics -IV fluid as above  DVT (deep venous thrombosis) ([place]): Patient has IVC filter placement -Hold Eliquis  Hyponatremia: Sodium 128.  Mental status normal. -Hold diuretics -Patient is on n.p.o due to GIB. which will serve as fluid restriction -Patient received 2 L LR in ED -Check BMP q8h  Thrombocytopenia ([place]): Platelet 99.  Possibly due to ongoing infection -check LDH and peripheral smear  Diarrhea: Patient has a chronic diarrhea, but seem  to be worse than baseline -Check C. difficile and GI pathogen panel  Elevated troponin: Troponin level 276 --> 201.  No chest pain, most likely due to demand ischemia -Trend troponin -Check A1c, FLP -Continue Crestor -Cannot receive aspirin due to GI bleeding  DVT ppx: none (due to possible GI bleeding, cannot use heparin or Lovenox.  Hold Eliquis.  Due to history of DVT, cannot use SCD) Code Status: Full code per pt and her daughter Family Communication: Yes, patient's daughter by phone Disposition Plan:  Anticipate discharge back to previous environment Consults called: Dr. Lanney Gins of ICU Admission status and Level of care: Stepdown:  as inpt        Status is: Inpatient  Remains inpatient appropriate because: Patient has multiple comorbidities, now presents with severe sepsis due to multifocal pneumonia and a UTI.  Patient also has diarrhea, crampy abdominal pain, hyponatremia, elevated troponin, thrombocytopenia.  Her presentation is highly complicated.  Patient is at high risk of  deteriorating.  Need to be treated in hospital for at least 2 days          Date of Service 06/25/2021    [facility]   If 7PM-7AM, please contact night-coverage [email or web address] 06/25/2021, 6:14 PM

## 2021-06-25 NOTE — ED Notes (Signed)
Portable CXR performed

## 2021-06-25 NOTE — ED Notes (Signed)
RT at the bedside to apply Cpap

## 2021-06-25 NOTE — ED Notes (Addendum)
RT at the bedside.

## 2021-06-25 NOTE — ED Notes (Signed)
Critical troponin of 276, Dr. Alfred Levins notified and aware

## 2021-06-25 NOTE — Consult Note (Signed)
NAME:  Debra Saunders, MRN:  [id number], DOB:  11/10/1945, LOS: 0 ADMISSION DATE:  06/25/2021, CONSULTATION DATE:  06/25/21  REFERRING MD:  Hospitalist, CHIEF COMPLAINT:  shortness of breath, fever, chills, abdominal pain  History of Present Illness:  76yo female w/ a past medical history of HTN, HLD, Type II Diabetes, depression, subdural hematoma 2014, SA Node dysfunction s/p pacemaker, OSA, PAH, interstitial lung disease on 4L home O2, diastolic heart failure, atrial fibrillation on Eliquis, CKD Stage IIIa, cardiorenal syndrome, anemia and DVT who presented to ED this morning with shortness of breath, cough, fever, chills, burning on urination and dysuria. Patient also reportedly had one dark stool yesterday.   Patient was initially hypotensive on arrival to the ED, but improved after 2L of crystalloid. She was hypoxic on 4L oxygen w/ respiratory distress and placed on BIPAP.   ED workup revealed: WBC 20, Hgb 9-->7, lactate 1, troponin 276, +UA, COVID - and Flu -, CT abdomen/pelvis negative for acute findings, BC +for gram positive cocci and BCID detected streptococcus species.   Patient was started on Vancomycin and Cefepime and then transitioned to rocephin when blood cultures resulted. Initially improved with IVFs but then became hypotensive again requiring initiation of Levophed gtt and received steroids. Critical care was consulted.  Patient seen in ED. She is lethargic but arousable on BIPAP. VSS on levophed at 52mcg. Daughter at bedside.   Pertinent  Medical History  Atrial Fibrillation DVT Cardiorenal syndrome Combined systolic and diastolic heart failure EF 55-60% Chronic respiratory failure on 4L [place] at home COPD Prior ICH HLD HTN ILD NICM Lymphedema Pulmonary Hypertension Sleep Apnea Type II Diabetes  Significant Hospital Events: Including procedures, antibiotic start and stop dates in addition to other pertinent events   1/3 Admitted to ICU  Interim History /  Subjective:  76yo female with multiple comorbidities admitted with septic shock 2/2 UTI and bacteremia with a GI Bleed on 06/25/21.   Objective   Blood pressure (!) 102/50, pulse 70, temperature 100.1 F (37.8 C), temperature source Oral, resp. rate 17, weight 87.1 kg, SpO2 97 %.        Intake/Output Summary (Last 24 hours) at 06/25/2021 1701 Last data filed at 06/25/2021 7209 Gross per 24 hour  Intake 2310 ml  Output --  Net 2310 ml   Filed Weights   06/25/21 0851  Weight: 87.1 kg    Examination: General: Lethargic but arousable. Ill appearing HENT: Pupils equal and reactive Lungs: Lung sounds diminished on BIPAP currently  Cardiovascular: Irregular rhythm Abdomen: Rounded, tender, active bowel sounds Extremities: Warm, no deformities Neuro: Lethargic but easily arousable, VSS   Resolved Hospital Problem list   NA  Assessment & Plan:  Septic Shock 2/2 UTI/Bacteremia/Multifocal pneumonia --Blood cultures +GPC and BCID detected streptococcus species --Urinalysis + for UTI, culture pending --Initially on Vanc/Cefepime and narrowed to Rocephin --Lactate 1 --Continue Levophed for MAP > 65 --CT Abdomen/Pelvis negative for any acute findings --Received 2L Crystalloid in ED  GI Bleed --Reported one dark stool yesterday --Hgb dropped from 9-->7 --Receiving 1 unit of PRBC now --Started Protonix 40mg  IV BID --GI to see in AM  Acute on Chronic Respiratory Failure ILD on 4L chronic O2 COPD OSA on CPAP --Patient with acute onset of respiratory distress in ED --Concern for multifocal pneumonia on CXR --Responded well to BIPAP --ABG this afternoon: 7.26/38/118/17 on 05/29/39% --Will trial off and then put back on overnight --Continue Rocephin and Azithromycin --PRN Duonebs  Pulmonary Hypertension --Home medications: Sidenafil, Macitentan and  Selexigap --Holding due to hypotension  Non-Anion gap metabolic acidosis --pH 3.23 w/bicarb of 17 --Etiology unclear, Possibly  from diarrhea vs worsening renal failure --Monitor closely  Atrial Fibrillation --Home medications: Digoxin and Eliquis --Holding due to GI Bleed and worsening renal function  Chronic Diastolic heart failure --2D echo on 09/12/2019 showed EF 60 to 65%.  BNP is elevated 344  Acute on Chronic renal failure --Baseline Stage IIIa renal failure --Cr 3.11 on admission --Suspect 2/2 UTI and dehydration --Received 2L of crystalloid  DVT --Holding Eliquis --Patient does have an IVC filter  Hyponatremia --Na 128-->126 --Suspect 2/2 Dehydration given diarrhea and poor appetite --Received 2L LR in ED --BMP q8hr  Chronic LLE lymphedema --Holding lasix due to above  Best Practice (right click and "Reselect all SmartList Selections" daily)   Diet/type: NPO DVT prophylaxis: other GI prophylaxis: H2B Lines: N/A Foley:  N/A Code Status:  full code Last date of multidisciplinary goals of care discussion [Daughter updated at bedside]  Labs   CBC: Recent Labs  Lab 06/24/21 1456 06/25/21 1350  WBC 20.1* 18.3*  HGB 8.1* 7.0*  HCT 25.0* 22.0*  MCV 85.0 85.9  PLT 99* 75*    Basic Metabolic Panel: Recent Labs  Lab 06/24/21 1456 06/25/21 1350  NA 128* 126*  K 4.2 4.9  CL 101 98  CO2 19* 20*  GLUCOSE 122* 171*  BUN 57* 74*  CREATININE 2.27* 3.11*  CALCIUM 8.2* 7.9*   GFR: Estimated Creatinine Clearance: 17.2 mL/min (A) (by C-G formula based on SCr of 3.11 mg/dL (H)). Recent Labs  Lab 06/24/21 1456 06/25/21 0347 06/25/21 0433 06/25/21 1350  PROCALCITON  --  18.79  --   --   WBC 20.1*  --   --  18.3*  LATICACIDVEN  --   --  1.0 1.0    Liver Function Tests: Recent Labs  Lab 06/24/21 1456  AST 19  ALT 12  ALKPHOS 128*  BILITOT 2.7*  PROT 6.3*  ALBUMIN 2.5*   Recent Labs  Lab 06/24/21 1456  LIPASE 45   No results for input(s): AMMONIA in the last 168 hours.  ABG    Component Value Date/Time   PHART 7.26 (L) 06/25/2021 1607   PCO2ART 38 06/25/2021  1607   PO2ART 118 (H) 06/25/2021 1607   HCO3 17.1 (L) 06/25/2021 1607   TCO2 33 (H) 08/30/2020 1118   ACIDBASEDEF 9.2 (H) 06/25/2021 1607   O2SAT 98.0 06/25/2021 1607     Coagulation Profile: Recent Labs  Lab 06/25/21 0258  INR 2.1*    Cardiac Enzymes: No results for input(s): CKTOTAL, CKMB, CKMBINDEX, TROPONINI in the last 168 hours.  HbA1C: Hemoglobin A1C  Date/Time Value Ref Range Status  04/10/2020 02:21 PM 5.4 4.0 - 5.6 % Final  02/08/2020 11:44 AM   Final    Comment:    5.2  02/03/2013 04:41 AM 6.2 4.2 - 6.3 % Final    Comment:    The American Diabetes Association recommends that a primary goal of therapy should be <7% and that physicians should reevaluate the treatment regimen in patients with HbA1c values consistently >8%.   01/12/2013 04:35 AM 7.2 (H) 4.2 - 6.3 % Final    Comment:    The American Diabetes Association recommends that a primary goal of therapy should be <7% and that physicians should reevaluate the treatment regimen in patients with HbA1c values consistently >8%.    Hgb A1c MFr Bld  Date/Time Value Ref Range Status  04/01/2021 02:16 PM 5.6 4.6 -  6.5 % Final    Comment:    Glycemic Control Guidelines for People with Diabetes:Non Diabetic:  <6%Goal of Therapy: <7%Additional Action Suggested:  >8%   02/03/2019 02:13 PM 6.2 4.6 - 6.5 % Final    Comment:    Glycemic Control Guidelines for People with Diabetes:Non Diabetic:  <6%Goal of Therapy: <7%Additional Action Suggested:  >8%     CBG: No results for input(s): GLUCAP in the last 168 hours.  Review of Systems:   Unable to assess due to condition  Past Medical History:  She,  has a past medical history of Arthritis, Atrial fibrillation, persistent ([place]), Cardiorenal syndrome, Chest pain, Chronic back pain, Chronic combined systolic (congestive) and diastolic (congestive) heart failure (HCC), Chronic respiratory failure ([place]), COPD (chronic obstructive pulmonary disease) ([place]), Gastritis,  History of intracranial hemorrhage, Hyperlipidemia, Hypertension, Interstitial lung disease ([place]), Lipoma of back, Lymphedema, NICM (nonischemic cardiomyopathy) ([place]), Obesity, PAH (pulmonary artery hypertension) ([place]), Presence of permanent cardiac pacemaker (01/09/2014), Sepsis with metabolic encephalopathy ([place]), Sleep apnea, Streptococcal bacteremia, Stroke ([place]) (2014), Subarachnoid hemorrhage ([place]) (01/19/2013), Symptomatic bradycardia, Thyroid nodule, Type II diabetes mellitus ([place]), and Urinary incontinence.   Surgical History:   Past Surgical History:  Procedure Laterality Date   ABDOMINAL HYSTERECTOMY  1969   BREAST BIOPSY Right 10/28/2019   lymph node bx, [place] Bx, pending path    CARDIAC CATHETERIZATION  2013   @ [place]: No obstructive CAD: Only 20% ostial left circumflex.   CARDIOVERSION N/A 10/12/2017   Procedure: CARDIOVERSION;  Surgeon: Minna Merritts, MD;  Location: [facility] ORS;  Service: Cardiovascular;  Laterality: N/A;   CATARACT EXTRACTION W/PHACO Right 09/19/2020   Procedure: CATARACT EXTRACTION PHACO AND INTRAOCULAR LENS PLACEMENT (IOC) RIGHT DIABETIC 3.95 00:43.0 9.2%;  Surgeon: Leandrew Koyanagi, MD;  Location: [place];  Service: Ophthalmology;  Laterality: Right;  Pt requests to be last sleep apnea   CATARACT EXTRACTION W/PHACO Left 10/03/2020   Procedure: CATARACT EXTRACTION PHACO AND INTRAOCULAR LENS PLACEMENT ([place]) LEFT DIABETIC;  Surgeon: Leandrew Koyanagi, MD;  Location: [place];  Service: Ophthalmology;  Laterality: Left;  3.25 0:59.3 5.5%   CHOLECYSTECTOMY     COLONOSCOPY WITH PROPOFOL N/A 09/10/2018   Procedure: COLONOSCOPY WITH PROPOFOL;  Surgeon: Manya Silvas, MD;  Location: [facility] ENDOSCOPY;  Service: Endoscopy;  Laterality: N/A;   CYST REMOVAL TRUNK  2002   BACK   DIALYSIS/PERMA CATHETER INSERTION N/A 10/06/2017   Procedure: DIALYSIS/PERMA CATHETER INSERTION;  Surgeon: Katha Cabal, MD;  Location: [place] [facility] LAB;   Service: Cardiovascular;  Laterality: N/A;   INSERT / REPLACE / REMOVE PACEMAKER     lipoma removal  2014   back   PACEMAKER INSERTION  2015   [employer] dual chamber pacemaker implanted by Dr Caryl Comes for symptomatic bradycardia   PERMANENT PACEMAKER INSERTION N/A 01/09/2014   Procedure: PERMANENT PACEMAKER INSERTION;  Surgeon: Deboraha Sprang, MD;  Location: [facility] C[facility] LAB;  Service: Cardiovascular;  Laterality: N/A;   RIGHT HEART CATH N/A 04/19/2018   Procedure: RIGHT HEART CATH;  Surgeon: Larey Dresser, MD;  Location: [place] CV LAB;  Service: Cardiovascular;  Laterality: N/A;   RIGHT HEART CATH N/A 08/30/2020   Procedure: RIGHT HEART CATH;  Surgeon: Larey Dresser, MD;  Location: [place] CV LAB;  Service: Cardiovascular;  Laterality: N/A;   RIGHT/LEFT HEART CATH AND CORONARY ANGIOGRAPHY N/A 10/19/2017   Procedure: RIGHT/LEFT HEART CATH AND CORONARY ANGIOGRAPHY;  Surgeon: Wellington Hampshire, MD;  Location: [place] [facility] LAB;  Service: Cardiovascular;  Laterality: N/A;   TEE WITHOUT CARDIOVERSION N/A 08/20/2017   Procedure: TRANSESOPHAGEAL ECHOCARDIOGRAM (TEE) & Direct current cardioversion;  Surgeon: Minna Merritts, MD;  Location: [facility] ORS;  Service: Cardiovascular;  Laterality: N/A;   TRIGGER FINGER RELEASE       Social History:   reports that she quit smoking about 31 years ago. Her smoking use included cigarettes. She has a 0.60 pack-year smoking history. She has never used smokeless tobacco. She reports that she does not drink alcohol and does not use drugs.   Family History:  Her family history includes Bone cancer in her sister; Breast cancer in her maternal aunt and sister; COPD in her mother; Heart disease in her mother; Hypertension in her father, mother, sister, and sister; Thyroid disease in her mother, sister, and sister. There is no history of Kidney disease or Bladder Cancer.   Allergies Allergies  Allergen Reactions   Aspirin Hives and Other (See Comments)     Pt states that she is unable to take because she had bleeding in her brain   Other Other (See Comments)    Pt states that she is unable to take blood thinners because she had bleeding in her brain; Blood Thinners-per doctor at [facility] (Xarelto)   Penicillins Hives, Shortness Of Breath, Swelling and Other (See Comments)    Has patient had a PCN reaction causing immediate rash, facial/tongue/throat swelling, SOB or lightheadedness with hypotension: Yes Has patient had a PCN reaction causing severe rash involving mucus membranes or skin necrosis: No Has patient had a PCN reaction that required hospitalization No Has patient had a PCN reaction occurring within the last 10 years: No If all of the above answers are "NO", then may proceed with Cephalosporin use.     Home Medications  Prior to Admission medications   Medication Sig Start Date End Date Taking? Authorizing Provider  acetaminophen (TYLENOL) 325 MG tablet Take 325-650 mg by mouth every 6 (six) hours as needed for mild pain.   Yes [provider]  albuterol (ACCUNEB) 0.63 MG/3ML nebulizer solution Take 1 ampule by nebulization every 6 (six) hours as needed for wheezing.   Yes [provider]  apixaban (ELIQUIS) 5 MG TABS tablet TAKE 1 TABLET BY MOUTH TWICE DAILY . APPOINTMENT REQUIRED FOR FUTURE REFILLS 03/22/21  Yes Wellington Hampshire, MD  cholestyramine Lucrezia Starch) 4 GM/DOSE powder Take 4 g by mouth daily. 08/03/20  Yes [provider]  Difluprednate 0.05 % EMUL Place 1 drop into both eyes 2 (two) times daily.   Yes [provider]  digoxin (LANOXIN) 0.125 MG tablet Take 1 tablet (125 mcg total) by mouth daily. 09/03/20  Yes Larey Dresser, MD  DULoxetine (CYMBALTA) 30 MG capsule TAKE 1 CAPSULE BY MOUTH ONCE DAILY FOR 14 DAYS, THEN INCREASE TO 2 CAPSULES ONCE DAILY Patient taking differently: Take 60 mg by mouth daily. 06/06/21  Yes [name], MD  gabapentin (NEURONTIN) 300 MG capsule Take  600 mg by mouth 3 (three) times daily.   Yes [provider]  KLOR-CON M20 20 MEQ tablet Take 1 tablet by mouth once daily 09/24/20  Yes Larey Dresser, MD  Lancets MISC Use with True Metrix glucometer to check sugars BID 11/14/19  Yes [name], MD  macitentan (OPSUMIT) 10 MG tablet Take 1 tablet (10 mg total) by mouth daily. 01/24/21  Yes Larey Dresser, MD  meclizine (ANTIVERT) 25 MG tablet Take 1 tablet (25 mg total) by mouth 3 (three) times  daily as needed for dizziness. 12/25/20  Yes Larey Dresser, MD  Menthol, Topical Analgesic, (BIOFREEZE EX) Apply 1 application topically 4 (four) times daily as needed (for pain).   Yes [provider]  rosuvastatin (CRESTOR) 10 MG tablet Take 1 tablet by mouth once daily 06/06/21  Yes Larey Dresser, MD  Selexipag (UPTRAVI) 1600 MCG TABS Take 1 tablet (1,600 mcg total) by mouth 2 (two) times daily. 04/19/21  Yes Larey Dresser, MD  sildenafil (REVATIO) 20 MG tablet Take 1 tablet (20 mg total) by mouth 3 (three) times daily. 01/03/21  Yes Larey Dresser, MD  spironolactone (ALDACTONE) 25 MG tablet TAKE 1 TABLET BY MOUTH AT BEDTIME.  PLEASE CALL FOR OFFICE VISIT [phone] 02/04/21  Yes Larey Dresser, MD  torsemide (DEMADEX) 20 MG tablet Take 40 mg by mouth 2 (two) times daily.   Yes [provider]  TRUE METRIX BLOOD GLUCOSE TEST test strip CHECK BLOOD GLUCOSE TWICE DAILY 11/21/20  Yes [name], MD  gabapentin (NEURONTIN) 300 MG capsule Take 1 capsule (300 mg total) by mouth every morning AND 1 capsule (300 mg total) daily after lunch AND 2 capsules (600 mg total) at bedtime. Patient not taking: Reported on 06/25/2021 05/24/21   [name], MD  ondansetron (ZOFRAN) 8 MG tablet Take 1 tablet (8 mg total) by mouth every 8 (eight) hours as needed for nausea or vomiting. Patient not taking: Reported on 06/25/2021 09/25/14   Jackolyn Confer, MD     Critical care time: 46 minutes     Tonye Royalty  ACNP-BC

## 2021-06-25 NOTE — ED Notes (Signed)
Patient called out stating "I cannot breathe". Pt is tachypneic, using accessory muscles, and cannot speak in  complete sentences. O2 sats barely staying at 87-88% on 5L/O2. Dr. Alfred Levins notified and aware.

## 2021-06-25 NOTE — Progress Notes (Addendum)
Pharmacy Antibiotic Note  Debra Saunders is a 76 y.o. female admitted on 06/25/2021 with pneumonia and sepsis.  Pharmacy has been consulted for Cefepime and Vancomycin dosing.  -suspected influenza?  ~3 weeks ago per "office visit/virtual" note -PCT 18.79 -06/25/21: * note weight is a verbal per pt (87.1 kg)- could not get one from bed per RN  Plan: pt received Azith x1 and Cefepime 1 gram x 1 and Vancomycin 1 gram IV x1 in ED  -Will order Cefepime 1 gram x 1 for a total dose of 2 grams this am. Will continue with Cefepime 2 gram IV q24h  for Crcl 23.5 ml/min   -Will order Vancomycin 750mg  x1 for a total loading dose of 1750 mg. Will order random vancomycin level with am labs to determine further dosing given Crcl 23.5 ml/min, Scr 2.27 Goal trough= 15-20 mcg/ml  MRSA PCR ordered,  f/u BMp ordered this am,  plan scheduled Vancomycin dosing when renal fxn improved/stable    Weight: 87.1 kg (192 lb 0.3 oz)  Temp (24hrs), Avg:99.6 F (37.6 C), Min:99.3 F (37.4 C), Max:100.1 F (37.8 C)  Recent Labs  Lab 06/24/21 1456 06/25/21 0433  WBC 20.1*  --   CREATININE 2.27*  --   LATICACIDVEN  --  1.0    Estimated Creatinine Clearance: 23.5 mL/min (A) (by C-G formula based on SCr of 2.27 mg/dL (H)).    Allergies  Allergen Reactions   Aspirin Hives and Other (See Comments)    Pt states that she is unable to take because she had bleeding in her brain   Other Other (See Comments)    Pt states that she is unable to take blood thinners because she had bleeding in her brain; Blood Thinners-per doctor at [facility] (Xarelto)   Penicillins Hives, Shortness Of Breath, Swelling and Other (See Comments)    Has patient had a PCN reaction causing immediate rash, facial/tongue/throat swelling, SOB or lightheadedness with hypotension: Yes Has patient had a PCN reaction causing severe rash involving mucus membranes or skin necrosis: No Has patient had a PCN reaction that required hospitalization  No Has patient had a PCN reaction occurring within the last 10 years: No If all of the above answers are "NO", then may proceed with Cephalosporin use.    Antimicrobials this admission: Azith x 1 1/3     Cefepime 1/3   >>   Vanc  1/3 >>  Dose adjustments this admission:   Microbiology results: 1/3 BCx: NG <12hr 1/3 UCx: pend    Sputum:    1/3 MRSA PCR: pend  Thank you for allowing pharmacy to be a part of this patients care.  Areatha Kalata A 06/25/2021 9:41 AM

## 2021-06-25 NOTE — ED Notes (Signed)
Lab at bedside

## 2021-06-25 NOTE — ED Notes (Signed)
Dr. Alfred Levins at the bedside to evaluate patient

## 2021-06-25 NOTE — Consult Note (Signed)
CODE SEPSIS - PHARMACY COMMUNICATION  **Broad Spectrum Antibiotics should be administered within 1 hour of Sepsis diagnosis**  Time Code Sepsis Called/Page Received: 0312  Antibiotics Ordered: 8118  Time of 1st antibiotic administration: [place] ,PharmD, BCPS Clinical Pharmacist  06/25/2021  4:58 AM

## 2021-06-26 ENCOUNTER — Other Ambulatory Visit: Payer: Self-pay

## 2021-06-26 ENCOUNTER — Encounter: Payer: Self-pay | Admitting: Internal Medicine

## 2021-06-26 ENCOUNTER — Inpatient Hospital Stay (HOSPITAL_COMMUNITY)
Admit: 2021-06-26 | Discharge: 2021-06-26 | Disposition: A | Discharge status: Home / Self Care | Payer: Medicare HMO | Attending: Internal Medicine | Admitting: Internal Medicine

## 2021-06-26 ENCOUNTER — Ambulatory Visit: Payer: Medicare HMO | Admitting: Adult Health

## 2021-06-26 ENCOUNTER — Inpatient Hospital Stay: Payer: Medicare HMO

## 2021-06-26 DIAGNOSIS — N179 Acute kidney failure, unspecified: Secondary | ICD-10-CM | POA: Diagnosis not present

## 2021-06-26 DIAGNOSIS — A419 Sepsis, unspecified organism: Secondary | ICD-10-CM

## 2021-06-26 DIAGNOSIS — A409 Streptococcal sepsis, unspecified: Secondary | ICD-10-CM | POA: Diagnosis not present

## 2021-06-26 DIAGNOSIS — R6520 Severe sepsis without septic shock: Secondary | ICD-10-CM | POA: Diagnosis not present

## 2021-06-26 DIAGNOSIS — B955 Unspecified streptococcus as the cause of diseases classified elsewhere: Secondary | ICD-10-CM

## 2021-06-26 DIAGNOSIS — R7881 Bacteremia: Secondary | ICD-10-CM | POA: Diagnosis not present

## 2021-06-26 DIAGNOSIS — R6521 Severe sepsis with septic shock: Secondary | ICD-10-CM | POA: Diagnosis not present

## 2021-06-26 DIAGNOSIS — N1831 Chronic kidney disease, stage 3a: Secondary | ICD-10-CM | POA: Diagnosis not present

## 2021-06-26 DIAGNOSIS — K921 Melena: Secondary | ICD-10-CM

## 2021-06-26 DIAGNOSIS — E871 Hypo-osmolality and hyponatremia: Secondary | ICD-10-CM | POA: Diagnosis not present

## 2021-06-26 LAB — COMPREHENSIVE METABOLIC PANEL
ALT: 11 U/L (ref 0–44)
AST: 16 U/L (ref 15–41)
Albumin: 2.3 g/dL — ABNORMAL LOW (ref 3.5–5.0)
Alkaline Phosphatase: 137 U/L — ABNORMAL HIGH (ref 38–126)
Anion gap: 11 (ref 5–15)
BUN: 96 mg/dL — ABNORMAL HIGH (ref 8–23)
CO2: 18 mmol/L — ABNORMAL LOW (ref 22–32)
Calcium: 7.7 mg/dL — ABNORMAL LOW (ref 8.9–10.3)
Chloride: 95 mmol/L — ABNORMAL LOW (ref 98–111)
Creatinine, Ser: 3.02 mg/dL — ABNORMAL HIGH (ref 0.44–1.00)
GFR, Estimated: 15 mL/min — ABNORMAL LOW (ref >60)
Glucose, Bld: 224 mg/dL — ABNORMAL HIGH (ref 70–99)
Potassium: 5 mmol/L (ref 3.5–5.1)
Sodium: 124 mmol/L — ABNORMAL LOW (ref 135–145)
Total Bilirubin: 1.9 mg/dL — ABNORMAL HIGH (ref 0.3–1.2)
Total Protein: 6.6 g/dL (ref 6.5–8.1)

## 2021-06-26 LAB — LIPID PANEL
Cholesterol: 89 mg/dL (ref 0–200)
HDL: <10 mg/dL — ABNORMAL LOW (ref >40)
Triglycerides: 132 mg/dL (ref <150)
VLDL: 26 mg/dL (ref 0–40)

## 2021-06-26 LAB — TYPE AND SCREEN
ABO/RH(D): B POS
Antibody Screen: NEGATIVE
Unit division: 0

## 2021-06-26 LAB — PROCALCITONIN: Procalcitonin: 12.96 ng/mL

## 2021-06-26 LAB — BPAM RBC
Blood Product Expiration Date: 202301132359
ISSUE DATE / TIME: 202301031708
Unit Type and Rh: 7300

## 2021-06-26 LAB — CBC
HCT: 25.8 % — ABNORMAL LOW (ref 36.0–46.0)
Hemoglobin: 8.2 g/dL — ABNORMAL LOW (ref 12.0–15.0)
MCH: 26.8 pg (ref 26.0–34.0)
MCHC: 31.8 g/dL (ref 30.0–36.0)
MCV: 84.3 fL (ref 80.0–100.0)
Platelets: 93 10*3/uL — ABNORMAL LOW (ref 150–400)
RBC: 3.06 MIL/uL — ABNORMAL LOW (ref 3.87–5.11)
RDW: 15 % (ref 11.5–15.5)
WBC: 14.3 10*3/uL — ABNORMAL HIGH (ref 4.0–10.5)
nRBC: 0 % (ref 0.0–0.2)

## 2021-06-26 LAB — CBG MONITORING, ED: Glucose-Capillary: 212 mg/dL — ABNORMAL HIGH (ref 70–99)

## 2021-06-26 LAB — MRSA NEXT GEN BY PCR, NASAL: MRSA by PCR Next Gen: NOT DETECTED

## 2021-06-26 LAB — STREP PNEUMONIAE URINARY ANTIGEN: Strep Pneumo Urinary Antigen: NEGATIVE

## 2021-06-26 MED ORDER — MIDODRINE HCL 5 MG PO TABS
10.0000 mg | ORAL_TABLET | Freq: Three times a day (TID) | ORAL | Status: DC
  Administered 2021-06-26 (×3): 10 mg via ORAL
  Filled 2021-06-26 (×4): qty 2

## 2021-06-26 MED ORDER — MIDODRINE HCL 5 MG PO TABS
ORAL_TABLET | ORAL | Status: AC
  Filled 2021-06-26: qty 1

## 2021-06-26 MED ORDER — LACTATED RINGERS IV BOLUS
500.0000 mL | Freq: Once | INTRAVENOUS | Status: AC
  Administered 2021-06-26: 500 mL via INTRAVENOUS

## 2021-06-26 MED ORDER — SODIUM CHLORIDE 0.9 % IV SOLN: INTRAVENOUS | Status: DC

## 2021-06-26 MED ORDER — STERILE WATER FOR INJECTION IV SOLN
INTRAVENOUS | Status: DC
  Filled 2021-06-26 (×4): qty 150
  Filled 2021-06-26: qty 1000
  Filled 2021-06-26: qty 150
  Filled 2021-06-26 (×3): qty 1000

## 2021-06-26 NOTE — ED Notes (Signed)
CBG 212 

## 2021-06-26 NOTE — ED Notes (Signed)
Turned off levophed. Spoke with attending provider. MAP is 73.

## 2021-06-26 NOTE — ED Notes (Signed)
Per Dr Tera Helper, continue to wean off levophed and MAP of 60 or above is acceptable.

## 2021-06-26 NOTE — ED Notes (Signed)
Pt resting comfortably in hospital bed. 2 family members at bedside. Provided updates to family members. Pt denies needs at this time.

## 2021-06-26 NOTE — Progress Notes (Deleted)
Acute Office Visit  Subjective:    Patient ID: Debra Saunders, female    DOB: [birth year], 76 y.o.   MRN: [id number]  No chief complaint on file.   HPI Patient is in today for ***  Past Medical History:  Diagnosis Date   Arthritis    Atrial fibrillation, persistent ([place])    a. s/p TEE-guided DCCV on 08/20/2017   Cardiorenal syndrome    a. 09/2017 Creat rose to 2.7 w/ diuresis-->HD x 2.   Chest pain    a. 01/2012 Cath: nonobs dzs;  c. 04/2014 MV: No ischemia.   Chronic back pain    Chronic combined systolic (congestive) and diastolic (congestive) heart failure ([place])    a. Dx 2005 @ [facility];  b. 02/2014 Echo: EF 55-60%; c. 07/2017: EF 30-35%, diffuse HK, trivial AI, severe MR, moderate TR; d. 09/2017 Echo: EF 40-45%; e. 02/2018 Echo: EF 55-60%, Gr1 DD; f. 08/2019 Echo: EF 60-65%, Gr2 DD, Nl RV fxn, RVSP 49.28mmHg. Mod-Sev MR. Mild to mod TR. Mod PR. RA ~ 74mmHg.   Chronic respiratory failure (HCC)    a. on home O2   Gastritis    History of intracranial hemorrhage    a. 6/14 described by neurosurgery as "small frontal posttraumatic SAH " - pt was on xarelto @ the time.   Hyperlipidemia    Hypertension    Interstitial lung disease (HCC)    Lipoma of back    Lymphedema    a. Chronic LLE edema.   NICM (nonischemic cardiomyopathy) ([place])    a. 09/2017 Echo: EF 40-45%; b. 09/2017 Cath: minor, insignificant CAD; c. 02/2018 Echo: EF 55-60%, Gr1 DD; d. 08/2019 Echo: EF 60-65%, Gr2 DD.   Obesity    PAH (pulmonary artery hypertension) ([place])    a. 09/2017 Echo: PASP 76mmHg; b. 09/2017 RHC: RA 23, RV 84/13, PA 84/29, PCWP 20, CO 3.89, CI 1.89. LVEDP 18.   Presence of permanent cardiac pacemaker 01/09/2014   Sepsis with metabolic encephalopathy ([place])    Sleep apnea    Streptococcal bacteremia    Stroke [facility]) 2014   Subarachnoid hemorrhage ([place]) 01/19/2013   Symptomatic bradycardia    a. s/p BSX PPM.   Thyroid nodule    Type II diabetes mellitus ([place])    Urinary incontinence     Past Surgical  History:  Procedure Laterality Date   ABDOMINAL HYSTERECTOMY  1969   BREAST BIOPSY Right 10/28/2019   lymph node bx, [place] Bx, pending path    CARDIAC CATHETERIZATION  2013   @ [facility]: No obstructive CAD: Only 20% ostial left circumflex.   CARDIOVERSION N/A 10/12/2017   Procedure: CARDIOVERSION;  Surgeon: Minna Merritts, MD;  Location: [facility] ORS;  Service: Cardiovascular;  Laterality: N/A;   CATARACT EXTRACTION W/PHACO Right 09/19/2020   Procedure: CATARACT EXTRACTION PHACO AND INTRAOCULAR LENS PLACEMENT (IOC) RIGHT DIABETIC 3.95 00:43.0 9.2%;  Surgeon: Leandrew Koyanagi, MD;  Location: [place];  Service: Ophthalmology;  Laterality: Right;  Pt requests to be last sleep apnea   CATARACT EXTRACTION W/PHACO Left 10/03/2020   Procedure: CATARACT EXTRACTION PHACO AND INTRAOCULAR LENS PLACEMENT ([place]) LEFT DIABETIC;  Surgeon: Leandrew Koyanagi, MD;  Location: [place];  Service: Ophthalmology;  Laterality: Left;  3.25 0:59.3 5.5%   CHOLECYSTECTOMY     COLONOSCOPY WITH PROPOFOL N/A 09/10/2018   Procedure: COLONOSCOPY WITH PROPOFOL;  Surgeon: Manya Silvas, MD;  Location[facility] ENDOSCOPY;  Service: Endoscopy;  Laterality: N/A;   CYST REMOVAL TRUNK  2002   BACK  DIALYSIS/PERMA CATHETER INSERTION N/A 10/06/2017   Procedure: DIALYSIS/PERMA CATHETER INSERTION;  Surgeon: Katha Cabal, MD;  Location: [place] CV LAB;  Service: Cardiovascular;  Laterality: N/A;   INSERT / REPLACE / REMOVE PACEMAKER     lipoma removal  2014   back   PACEMAKER INSERTION  2015   [employer] dual chamber pacemaker implanted by Dr Caryl Comes for symptomatic bradycardia   PERMANENT PACEMAKER INSERTION N/A 01/09/2014   Procedure: PERMANENT PACEMAKER INSERTION;  Surgeon: Deboraha Sprang, MD;  Location: [facility] CATH LAB;  Service: Cardiovascular;  Laterality: N/A;   RIGHT HEART CATH N/A 04/19/2018   Procedure: RIGHT HEART CATH;  Surgeon: Larey Dresser, MD;  Location: [place] CV LAB;   Service: Cardiovascular;  Laterality: N/A;   RIGHT HEART CATH N/A 08/30/2020   Procedure: RIGHT HEART CATH;  Surgeon: Larey Dresser, MD;  Location: [place] CV LAB;  Service: Cardiovascular;  Laterality: N/A;   RIGHT/LEFT HEART CATH AND CORONARY ANGIOGRAPHY N/A 10/19/2017   Procedure: RIGHT/LEFT HEART CATH AND CORONARY ANGIOGRAPHY;  Surgeon: Wellington Hampshire, MD;  Location: [place] [facility] LAB;  Service: Cardiovascular;  Laterality: N/A;   TEE WITHOUT CARDIOVERSION N/A 08/20/2017   Procedure: TRANSESOPHAGEAL ECHOCARDIOGRAM (TEE) & Direct current cardioversion;  Surgeon: Minna Merritts, MD;  Location: [facility] ORS;  Service: Cardiovascular;  Laterality: N/A;   TRIGGER FINGER RELEASE      Family History  Problem Relation Age of Onset   Heart disease Mother    Hypertension Mother    COPD Mother        was a smoker   Thyroid disease Mother    Hypertension Father    Hypertension Sister    Thyroid disease Sister    Breast cancer Sister    Hypertension Sister    Thyroid disease Sister    Bone cancer Sister    Breast cancer Maternal Aunt    Kidney disease Neg Hx    Bladder Cancer Neg Hx     Social History   Socioeconomic History   Marital status: Divorced    Spouse name: Not on file   Number of children: 4   Years of education: 12   Highest education level: High school graduate  Occupational History   Occupation: Retired- factory work    Comment: 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 Has 4 children. No pets.      Work - Restaurant manager, fast food, retired      Diet -  regular      02/14/2019: reports has Meals On Wheels delivered to her home;       Remote hx of smoking > 30 years; No alcohol-    Social Determinants of Health   Financial Resource Strain: Low Risk    Difficulty of Paying Living Expenses: Not very hard  Food Insecurity: No Food Insecurity   Worried About Running Out of Food in the Last Year: Never true   Ran Out of  Food in the Last Year: Never true  Transportation Needs: No Transportation Needs   Lack of Transportation (Medical): No   Lack of Transportation (Non-Medical): No  Physical Activity: Not on file  Stress: No Stress Concern Present   Feeling of Stress : Only a little  Social Connections: Socially Isolated   Frequency of Communication with Friends and Family: More than three times a week   Frequency of Social Gatherings with Friends and Family: Once a week   Attends Religious Services: Never   Marine scientist or Organizations: No   Attends Music therapist: Never   Marital Status: Divorced  Human resources officer Violence: Not At Risk   Fear of Current or Ex-Partner: No   Emotionally Abused: No   Physically Abused: No   Sexually Abused: No    Facility-Administered Medications Prior to Visit  Medication Dose Route Frequency Provider Last Rate Last Admin   acetaminophen (TYLENOL) tablet 650 mg  650 mg Oral Q6H PRN Ivor Costa, MD   650 mg at 06/26/21 0808   albuterol (PROVENTIL) (2.5 MG/3ML) 0.083% nebulizer solution 2.5 mg  2.5 mg Nebulization Q4H PRN Ivor Costa, MD       azithromycin (ZITHROMAX) 500 mg in sodium chloride 0.9 % 250 mL IVPB  500 mg Intravenous Q24H Tonye Royalty, NP   Stopped at 06/26/21 1000   cefTRIAXone (ROCEPHIN) 2 g in sodium chloride 0.9 % 100 mL IVPB  2 g Intravenous Q24H Ivor Costa, MD   Stopped at 06/26/21 0848   cholestyramine (QUESTRAN) packet 4 g  4 g Oral Daily Ivor Costa, MD   4 g at 06/26/21 1102   dextromethorphan-guaiFENesin ([place] DM) 30-600 MG per 12 hr tablet 1 tablet   1 tablet Oral BID PRN Ivor Costa, MD       Difluprednate 0.05 % EMUL 1 drop  1 drop Both Eyes BID Ivor Costa, MD       DULoxetine (CYMBALTA) DR capsule 60 mg  60 mg Oral Daily Ivor Costa, MD   60 mg at 06/26/21 0856   gabapentin (NEURONTIN) capsule 300 mg  300 mg Oral BID Ivor Costa, MD   300 mg at 06/26/21 0856   ipratropium-albuterol (DUONEB) 0.5-2.5 (3) MG/3ML nebulizer solution 3 mL  3 mL Nebulization Q4H Ivor Costa, MD   3 mL at 06/26/21 1105   meclizine (ANTIVERT) tablet 25 mg  25 mg Oral TID PRN Ivor Costa, MD       Menthol 5 % PTCH   Topical QID PRN Ivor Costa, MD       methylPREDNISolone sodium succinate (SOLU-MEDROL) 40 mg/mL injection 40 mg  40 mg Intravenous Q12H Ivor Costa, MD   40 mg at 06/26/21 0749   midodrine (PROAMATINE) tablet 10 mg  10 mg Oral TID WC Lang Snow, NP   10 mg at 06/26/21 0748   morphine 2 MG/ML injection 0.5 mg  0.5 mg Intravenous Q3H PRN Ivor Costa, MD       norepinephrine (LEVOPHED) 4mg  in 251mL (0.016 mg/mL) premix infusion  0-40 mcg/min Intravenous Continuous Alfred Levins, [place], MD 3.75 mL/hr at 06/26/21 1054 1 mcg/min at 06/26/21 1054   ondansetron (ZOFRAN) injection 4 mg  4 mg Intravenous Q8H PRN Ivor Costa, MD       pantoprazole (PROTONIX) injection 40 mg  40 mg Intravenous Q12H Ivor Costa, MD   40 mg at 06/26/21 0857   rosuvastatin (CRESTOR) tablet 10 mg  10 mg Oral Daily Ivor Costa, MD   10 mg  at 06/26/21 1102   sodium bicarbonate 150 mEq in sterile water 1,150 mL infusion   Intravenous Continuous Darel Hong D, NP 100 mL/hr at 06/26/21 1101 New Bag at 06/26/21 1101   Outpatient Medications Prior to Visit  Medication Sig Dispense Refill   acetaminophen (TYLENOL) 325 MG tablet Take 325-650 mg by mouth every 6 (six) hours as needed for mild pain.     albuterol (ACCUNEB) 0.63 MG/3ML nebulizer solution Take 1 ampule by nebulization every 6 (six) hours as needed for wheezing.     apixaban (ELIQUIS) 5 MG TABS tablet TAKE 1 TABLET BY MOUTH  TWICE DAILY . APPOINTMENT REQUIRED FOR FUTURE REFILLS 60 tablet 5   cholestyramine (QUESTRAN) 4 GM/DOSE powder Take 4 g by mouth daily.     Difluprednate 0.05 % EMUL Place 1 drop into both eyes 2 (two) times daily.     digoxin (LANOXIN) 0.125 MG tablet Take 1 tablet (125 mcg total) by mouth daily. 90 tablet 3   DULoxetine (CYMBALTA) 30 MG capsule TAKE 1 CAPSULE BY MOUTH ONCE DAILY FOR 14 DAYS, THEN INCREASE TO 2 CAPSULES ONCE DAILY (Patient taking differently: Take 60 mg by mouth daily.) 60 capsule 0   gabapentin (NEURONTIN) 300 MG capsule Take 1 capsule (300 mg total) by mouth every morning AND 1 capsule (300 mg total) daily after lunch AND 2 capsules (600 mg total) at bedtime. (Patient not taking: Reported on 06/25/2021) 360 capsule 1   gabapentin (NEURONTIN) 300 MG capsule Take 600 mg by mouth 3 (three) times daily.     KLOR-CON M20 20 MEQ tablet Take 1 tablet by mouth once daily 90 tablet 3   Lancets MISC Use with True Metrix glucometer to check sugars BID 200 each 3   macitentan (OPSUMIT) 10 MG tablet Take 1 tablet (10 mg total) by mouth daily. 30 tablet 11   meclizine (ANTIVERT) 25 MG tablet Take 1 tablet (25 mg total) by mouth 3 (three) times daily as needed for dizziness. 30 tablet 1   Menthol, Topical Analgesic, (BIOFREEZE EX) Apply 1 application topically 4 (four) times daily as needed (for pain).     ondansetron (ZOFRAN) 8 MG tablet Take 1 tablet (8 mg total) by mouth every 8 (eight) hours as needed for nausea or vomiting. (Patient not taking: Reported on 06/25/2021) 30 tablet 2   rosuvastatin (CRESTOR) 10 MG tablet Take 1 tablet by mouth once daily 90 tablet 0   Selexipag (UPTRAVI) 1600 MCG TABS Take 1 tablet (1,600 mcg total) by mouth 2 (two) times daily. 60 tablet 11   sildenafil (REVATIO) 20 MG tablet Take 1 tablet (20 mg total) by mouth 3 (three) times daily. 270 tablet 3   spironolactone (ALDACTONE) 25 MG tablet TAKE 1 TABLET BY MOUTH AT BEDTIME.  PLEASE CALL FOR OFFICE VISIT  [phone] 90 tablet 3   torsemide (DEMADEX) 20 MG tablet Take 40 mg by mouth 2 (two) times daily.     TRUE METRIX BLOOD GLUCOSE TEST test strip CHECK BLOOD GLUCOSE TWICE DAILY 200 strip 3    Allergies  Allergen Reactions   Aspirin Hives and Other (See Comments)    Pt states that she is unable to take because she had bleeding in her brain   Other Other (See Comments)    Pt states that she is unable to take blood thinners because she had bleeding in her brain; Blood Thinners-[facility] at [facility] (Xarelto)   Penicillins Hives, Shortness Of Breath, Swelling and Other (See Comments)  Has patient had a PCN reaction causing immediate rash, facial/tongue/throat swelling, SOB or lightheadedness with hypotension: Yes Has patient had a PCN reaction causing severe rash involving mucus membranes or skin necrosis: No Has patient had a PCN reaction that required hospitalization No Has patient had a PCN reaction occurring within the last 10 years: No If all of the above answers are "NO", then may proceed with Cephalosporin use.    Review of Systems     Objective:    Physical Exam  There were no vitals taken for this visit. Wt Readings from Last 3 Encounters:  06/25/21 192 lb 0.3 oz (87.1 kg)  06/06/21 192 lb (87.1 kg)  05/24/21 197 lb (89.4 kg)    Health Maintenance Due  Topic Date Due   Zoster Vaccines- Shingrix (1 of 2) Never done   COVID-19 Vaccine (3 - Pfizer risk series) 11/14/2019   URINE MICROALBUMIN  07/07/2020   FOOT EXAM  11/03/2020    There are no preventive care reminders to display for this patient.   Lab Results  Component Value Date   TSH 0.98 04/10/2020   Lab Results  Component Value Date   WBC 14.3 (H) 06/26/2021   HGB 8.2 (L) 06/26/2021   HCT 25.8 (L) 06/26/2021   MCV 84.3 06/26/2021   PLT 93 (L) 06/26/2021   Lab Results  Component Value Date   NA 124 (L) 06/26/2021   K 5.0 06/26/2021   CO2 18 (L) 06/26/2021   GLUCOSE 224 (H) 06/26/2021   BUN  96 (H) 06/26/2021   CREATININE 3.02 (H) 06/26/2021   BILITOT 1.9 (H) 06/26/2021   ALKPHOS 137 (H) 06/26/2021   AST 16 06/26/2021   ALT 11 06/26/2021   PROT 6.6 06/26/2021   ALBUMIN 2.3 (L) 06/26/2021   CALCIUM 7.7 (L) 06/26/2021   ANIONGAP 11 06/26/2021   GFR 49.55 (L) 04/10/2020   Lab Results  Component Value Date   CHOL 89 06/26/2021   Lab Results  Component Value Date   HDL <10 (L) 06/26/2021   Lab Results  Component Value Date   LDLCALC NOT CALCULATED 06/26/2021   Lab Results  Component Value Date   TRIG 132 06/26/2021   Lab Results  Component Value Date   CHOLHDL NOT CALCULATED 06/26/2021   Lab Results  Component Value Date   HGBA1C 5.4 06/25/2021       Assessment & Plan:   Problem List Items Addressed This Visit   None    No orders of the defined types were placed in this encounter.    Marcille Buffy, FNP

## 2021-06-26 NOTE — ED Notes (Signed)
Morning labs were already drawn this morning by lab staff.

## 2021-06-26 NOTE — ED Notes (Signed)
Covered pt with warm blankets.

## 2021-06-26 NOTE — Progress Notes (Signed)
NAME:  Debra Saunders, MRN:  [id number], DOB:  [birth year], LOS: 1 ADMISSION DATE:  06/25/2021, CONSULTATION DATE:  06/25/21  REFERRING MD:  Hospitalist, CHIEF COMPLAINT:  shortness of breath, fever, chills, abdominal pain  History of Present Illness:  76yo female w/ a past medical history of HTN, HLD, Type II Diabetes, depression, subdural hematoma 2014, SA Node dysfunction s/p pacemaker, OSA, PAH, interstitial lung disease on 4L home O2, diastolic heart failure, atrial fibrillation on Eliquis, CKD Stage IIIa, cardiorenal syndrome, anemia and DVT who presented to ED this morning with shortness of breath, cough, fever, chills, burning on urination and dysuria. Patient also reportedly had one dark stool yesterday.   Patient was initially hypotensive on arrival to the ED, but improved after 2L of crystalloid. She was hypoxic on 4L oxygen w/ respiratory distress and placed on BIPAP.   ED workup revealed: WBC 20, Hgb 9-->7, lactate 1, troponin 276, +UA, COVID - and Flu -, CT abdomen/pelvis negative for acute findings, BC +for gram positive cocci and BCID detected streptococcus species.   Patient was started on Vancomycin and Cefepime and then transitioned to rocephin when blood cultures resulted. Initially improved with IVFs but then became hypotensive again requiring initiation of Levophed gtt and received steroids. Critical care was consulted.  Patient seen in ED. She is lethargic but arousable on BIPAP. VSS on levophed at 78mcg. Daughter at bedside.   06/26/20- patient is improved, weaned down to 4L [place], she is now speaking and is only on 110mc levophed. Reviewed medical plan with RN and RT today.   Pertinent  Medical History  Atrial Fibrillation DVT Cardiorenal syndrome Combined systolic and diastolic heart failure EF 55-60% Chronic respiratory failure on 4L [place] at home COPD Prior ICH HLD HTN ILD NICM Lymphedema Pulmonary Hypertension Sleep Apnea Type II Diabetes  Significant Hospital  Events: Including procedures, antibiotic start and stop dates in addition to other pertinent events   1/3 Admitted to ICU  Interim History / Subjective:  76yo female with multiple comorbidities admitted with septic shock 2/2 UTI and bacteremia with a GI Bleed on 06/25/21.   Objective   Blood pressure 117/61, pulse 70, temperature 98 F (36.7 C), temperature source Axillary, resp. rate 20, weight 87.1 kg, SpO2 96 %.        Intake/Output Summary (Last 24 hours) at 06/26/2021 0943 Last data filed at 06/26/2021 0848 Gross per 24 hour  Intake 678.5 ml  Output 300 ml  Net 378.5 ml    Filed Weights   06/25/21 0851  Weight: 87.1 kg    Examination: General: Lethargic but arousable. Ill appearing HENT: Pupils equal and reactive Lungs: Lung sounds diminished on BIPAP currently  Cardiovascular: Irregular rhythm Abdomen: Rounded, tender, active bowel sounds Extremities: Warm, no deformities Neuro: Lethargic but easily arousable, VSS   Resolved Hospital Problem list   NA  Assessment & Plan:  Septic Shock 2/2 UTI/Bacteremia/Multifocal pneumonia --Blood cultures +GPC and BCID detected streptococcus species --Urinalysis + for UTI, culture pending --Initially on Vanc/Cefepime and narrowed to Rocephin --Lactate 1 --Continue Levophed for MAP > 65 --CT Abdomen/Pelvis negative for any acute findings --Received 2L Crystalloid in ED  GI Bleed --Reported one dark stool yesterday --Hgb dropped from 9-->7 --Receiving 1 unit of PRBC now --Started Protonix 40mg  IV BID --GI to see in AM  Acute on Chronic Respiratory Failure ILD on 4L chronic O2 COPD OSA on CPAP --Patient with acute onset of respiratory distress in ED --Concern for multifocal pneumonia on CXR --Responded well to  BIPAP --ABG this afternoon: 7.26/38/118/17 on 05/29/39% --Will trial off and then put back on overnight --Continue Rocephin and Azithromycin --PRN Duonebs  Pulmonary Hypertension --Home medications: Sidenafil,  Macitentan and Selexigap --Holding due to hypotension  Non-Anion gap metabolic acidosis --pH 2.44 w/bicarb of 17 --Etiology unclear, Possibly from diarrhea vs worsening renal failure --Monitor closely  Atrial Fibrillation --Home medications: Digoxin and Eliquis --Holding due to GI Bleed and worsening renal function  Chronic Diastolic heart failure --2D echo on 09/12/2019 showed EF 60 to 65%.  BNP is elevated 344  Acute on Chronic renal failure --Baseline Stage IIIa renal failure --Cr 3.11 on admission --Suspect 2/2 UTI and dehydration --Received 2L of crystalloid  DVT --Holding Eliquis --Patient does have an IVC filter  Hyponatremia --Na 128-->126 --Suspect 2/2 Dehydration given diarrhea and poor appetite --Received 2L LR in ED --BMP q8hr  Chronic LLE lymphedema --Holding lasix due to above  Best Practice (right click and "Reselect all SmartList Selections" daily)   Diet/type: NPO DVT prophylaxis: other GI prophylaxis: H2B Lines: N/A Foley:  N/A Code Status:  full code Last date of multidisciplinary goals of care discussion [Daughter updated at bedside]  Labs   CBC: Recent Labs  Lab 06/24/21 1456 06/25/21 1350 06/25/21 2243  WBC 20.1* 18.3* 22.8*  HGB 8.1* 7.0* 8.2*  HCT 25.0* 22.0* 25.3*  MCV 85.0 85.9 85.5  PLT 99* 75* 87*     Basic Metabolic Panel: Recent Labs  Lab 06/24/21 1456 06/25/21 1350 06/25/21 2243  NA 128* 126* 124*  K 4.2 4.9 5.1  CL 101 98 98  CO2 19* 20* 15*  GLUCOSE 122* 171* 201*  BUN 57* 74* 83*  CREATININE 2.27* 3.11* 3.21*  CALCIUM 8.2* 7.9* 8.0*    GFR: Estimated Creatinine Clearance: 16.6 mL/min (A) (by C-G formula based on SCr of 3.21 mg/dL (H)). Recent Labs  Lab 06/24/21 1456 06/25/21 0347 06/25/21 0433 06/25/21 1350 06/25/21 2243  PROCALCITON  --  18.79  --   --   --   WBC 20.1*  --   --  18.3* 22.8*  LATICACIDVEN  --   --  1.0 1.0  --      Liver Function Tests: Recent Labs  Lab 06/24/21 1456  06/25/21 1350  AST 19 21  ALT 12 13  ALKPHOS 128* 126  BILITOT 2.7* 3.0*  PROT 6.3* 6.2*  ALBUMIN 2.5* 2.2*    Recent Labs  Lab 06/24/21 1456  LIPASE 45    No results for input(s): AMMONIA in the last 168 hours.  ABG    Component Value Date/Time   PHART 7.26 (L) 06/25/2021 1607   PCO2ART 38 06/25/2021 1607   PO2ART 118 (H) 06/25/2021 1607   HCO3 17.1 (L) 06/25/2021 1607   TCO2 33 (H) 08/30/2020 1118   ACIDBASEDEF 9.2 (H) 06/25/2021 1607   O2SAT 98.0 06/25/2021 1607      Coagulation Profile: Recent Labs  Lab 06/25/21 0258  INR 2.1*     Cardiac Enzymes: No results for input(s): CKTOTAL, CKMB, CKMBINDEX, TROPONINI in the last 168 hours.  HbA1C: Hemoglobin A1C  Date/Time Value Ref Range Status  02/03/2013 04:41 AM 6.2 4.2 - 6.3 % Final    Comment:    The American Diabetes Association recommends that a primary goal of therapy should be <7% and that physicians should reevaluate the treatment regimen in patients with HbA1c values consistently >8%.   01/12/2013 04:35 AM 7.2 (H) 4.2 - 6.3 % Final    Comment:    The American  Diabetes Association recommends that a primary goal of therapy should be <7% and that physicians should reevaluate the treatment regimen in patients with HbA1c values consistently >8%.    Hgb A1c MFr Bld  Date/Time Value Ref Range Status  06/25/2021 03:47 AM 5.4 4.8 - 5.6 % Final    Comment:    (NOTE) Pre diabetes:          5.7%-6.4%  Diabetes:              >6.4%  Glycemic control for   <7.0% adults with diabetes   04/01/2021 02:16 PM 5.6 4.6 - 6.5 % Final    Comment:    Glycemic Control Guidelines for People with Diabetes:Non Diabetic:  <6%Goal of Therapy: <7%Additional Action Suggested:  >8%     CBG: Recent Labs  Lab 06/26/21 0747  GLUCAP 212*    Review of Systems:   Unable to assess due to condition  Past Medical History:  She,  has a past medical history of Arthritis, Atrial fibrillation, persistent ([place]),  Cardiorenal syndrome, Chest pain, Chronic back pain, Chronic combined systolic (congestive) and diastolic (congestive) heart failure (HCC), Chronic respiratory failure ([place]), Gastritis, History of intracranial hemorrhage, Hyperlipidemia, Hypertension, Interstitial lung disease ([place]), Lipoma of back, Lymphedema, NICM (nonischemic cardiomyopathy) ([place]), Obesity, PAH (pulmonary artery hypertension) ([place]), Presence of permanent cardiac pacemaker (01/09/2014), Sepsis with metabolic encephalopathy ([place]), Sleep apnea, Streptococcal bacteremia, Stroke ([place]) (2014), Subarachnoid hemorrhage ([place]) (01/19/2013), Symptomatic bradycardia, Thyroid nodule, Type II diabetes mellitus ([place]), and Urinary incontinence.   Surgical History:   Past Surgical History:  Procedure Laterality Date   ABDOMINAL HYSTERECTOMY  1969   BREAST BIOPSY Right 10/28/2019   lymph node bx, [place] Bx, pending path    CARDIAC CATHETERIZATION  2013   @ [place]: No obstructive CAD: Only 20% ostial left circumflex.   CARDIOVERSION N/A 10/12/2017   Procedure: CARDIOVERSION;  Surgeon: Minna Merritts, MD;  Location: [facility] ORS;  Service: Cardiovascular;  Laterality: N/A;   CATARACT EXTRACTION W/PHACO Right 09/19/2020   Procedure: CATARACT EXTRACTION PHACO AND INTRAOCULAR LENS PLACEMENT (I[facility]) RIGHT DIABETIC 3.95 00:43.0 9.2%;  Surgeon: Leandrew Koyanagi, MD;  Location: [place];  Service: Ophthalmology;  Laterality: Right;  Pt requests to be last sleep apnea   CATARACT EXTRACTION W/PHACO Left 10/03/2020   Procedure: CATARACT EXTRACTION PHACO AND INTRAOCULAR LENS PLACEMENT ([place]) LEFT DIABETIC;  Surgeon: Leandrew Koyanagi, MD;  Location: [place];  Service: Ophthalmology;  Laterality: Left;  3.25 0:59.3 5.5%   CHOLECYSTECTOMY     COLONOSCOPY WITH PROPOFOL N/A 09/10/2018   Procedure: COLONOSCOPY WITH PROPOFOL;  Surgeon: Manya Silvas, MD;  [facility] ENDOSCOPY;  Service: Endoscopy;  Laterality: N/A;   CYST REMOVAL TRUNK   2002   BACK   DIALYSIS/PERMA CATHETER INSERTION N/A 10/06/2017   Procedure: DIALYSIS/PERMA CATHETER INSERTION;  Surgeon: Katha Cabal, MD;  Location: [place] CV LAB;  Service: Cardiovascular;  Laterality: N/A;   INSERT / REPLACE / REMOVE PACEMAKER     lipoma removal  2014   back   PACEMAKER INSERTION  2015   [employer] dual chamber pacemaker implanted by Dr Caryl Comes for symptomatic bradycardia   PERMANENT PACEMAKER INSERTION N/A 01/09/2014   Procedure: PERMANENT PACEMAKER INSERTION;  Surgeon: Deboraha Sprang, MD;  [facility];  Service: Cardiovascular;  Laterality: N/A;   RIGHT HEART CATH N/A 04/19/2018   Procedure: RIGHT HEART CATH;  Surgeon: Larey Dresser, MD;  Location: [place] CV LAB;  Service: Cardiovascular;  Laterality: N/A;  RIGHT HEART CATH N/A 08/30/2020   Procedure: RIGHT HEART CATH;  Surgeon: Larey Dresser, MD;  Location: [place] CV LAB;  Service: Cardiovascular;  Laterality: N/A;   RIGHT/LEFT HEART CATH AND CORONARY ANGIOGRAPHY N/A 10/19/2017   Procedure: RIGHT/LEFT HEART CATH AND CORONARY ANGIOGRAPHY;  Surgeon: Wellington Hampshire, MD;  Location: [place] CV LAB;  Service: Cardiovascular;  Laterality: N/A;   TEE WITHOUT CARDIOVERSION N/A 08/20/2017   Procedure: TRANSESOPHAGEAL ECHOCARDIOGRAM (TEE) & Direct current cardioversion;  Surgeon: Minna Merritts, MD;  Location: [facility] ORS;  Service: Cardiovascular;  Laterality: N/A;   TRIGGER FINGER RELEASE       Social History:   reports that she quit smoking about 31 years ago. Her smoking use included cigarettes. She has a 0.60 pack-year smoking history. She has never used smokeless tobacco. She reports that she does not drink alcohol and does not use drugs.   Family History:  Her family history includes Bone cancer in her sister; Breast cancer in her maternal aunt and sister; COPD in her mother; Heart disease in her mother; Hypertension in her father, mother, sister, and sister; Thyroid  disease in her mother, sister, and sister. There is no history of Kidney disease or Bladder Cancer.   Allergies Allergies  Allergen Reactions   Aspirin Hives and Other (See Comments)    Pt states that she is unable to take because she had bleeding in her brain   Other Other (See Comments)    Pt states that she is unable to take blood thinners because she had bleeding in her brain; Blood Thinners-per doctor at [facility] (Xarelto)   Penicillins Hives, Shortness Of Breath, Swelling and Other (See Comments)    Has patient had a PCN reaction causing immediate rash, facial/tongue/throat swelling, SOB or lightheadedness with hypotension: Yes Has patient had a PCN reaction causing severe rash involving mucus membranes or skin necrosis: No Has patient had a PCN reaction that required hospitalization No Has patient had a PCN reaction occurring within the last 10 years: No If all of the above answers are "NO", then may proceed with Cephalosporin use.     Home Medications  Prior to Admission medications   Medication Sig Start Date End Date Taking? Authorizing Provider  acetaminophen (TYLENOL) 325 MG tablet Take 325-650 mg by mouth every 6 (six) hours as needed for mild pain.   Yes [provider]  albuterol (ACCUNEB) 0.63 MG/3ML nebulizer solution Take 1 ampule by nebulization every 6 (six) hours as needed for wheezing.   Yes [provider]  apixaban (ELIQUIS) 5 MG TABS tablet TAKE 1 TABLET BY MOUTH TWICE DAILY . APPOINTMENT REQUIRED FOR FUTURE REFILLS 03/22/21  Yes Wellington Hampshire, MD  cholestyramine Lucrezia Starch) 4 GM/DOSE powder Take 4 g by mouth daily. 08/03/20  Yes [provider]  Difluprednate 0.05 % EMUL Place 1 drop into both eyes 2 (two) times daily.   Yes [provider]  digoxin (LANOXIN) 0.125 MG tablet Take 1 tablet (125 mcg total) by mouth daily. 09/03/20  Yes Larey Dresser, MD  DULoxetine (CYMBALTA) 30 MG capsule TAKE 1 CAPSULE BY MOUTH ONCE DAILY  FOR 14 DAYS, THEN INCREASE TO 2 CAPSULES ONCE DAILY Patient taking differently: Take 60 mg by mouth daily. 06/06/21  Yes [name], MD  gabapentin (NEURONTIN) 300 MG capsule Take 600 mg by mouth 3 (three) times daily.   Yes [provider]  KLOR-CON M20 20 MEQ tablet Take 1 tablet by mouth once daily 09/24/20  Yes Larey Dresser, MD  Lancets MISC Use with True Metrix glucometer to check sugars BID 11/14/19  Yes [name], MD  macitentan (OPSUMIT) 10 MG tablet Take 1 tablet (10 mg total) by mouth daily. 01/24/21  Yes Larey Dresser, MD  meclizine (ANTIVERT) 25 MG tablet Take 1 tablet (25 mg total) by mouth 3 (three) times daily as needed for dizziness. 12/25/20  Yes Larey Dresser, MD  Menthol, Topical Analgesic, (BIOFREEZE EX) Apply 1 application topically 4 (four) times daily as needed (for pain).   Yes [provider]  rosuvastatin (CRESTOR) 10 MG tablet Take 1 tablet by mouth once daily 06/06/21  Yes Larey Dresser, MD  Selexipag (UPTRAVI) 1600 MCG TABS Take 1 tablet (1,600 mcg total) by mouth 2 (two) times daily. 04/19/21  Yes Larey Dresser, MD  sildenafil (REVATIO) 20 MG tablet Take 1 tablet (20 mg total) by mouth 3 (three) times daily. 01/03/21  Yes Larey Dresser, MD  spironolactone (ALDACTONE) 25 MG tablet TAKE 1 TABLET BY MOUTH AT BEDTIME.  PLEASE CALL FOR OFFICE VISIT [phone] 02/04/21  Yes Larey Dresser, MD  torsemide (DEMADEX) 20 MG tablet Take 40 mg by mouth 2 (two) times daily.   Yes [provider]  TRUE METRIX BLOOD GLUCOSE TEST test strip CHECK BLOOD GLUCOSE TWICE DAILY 11/21/20  Yes [name], MD  gabapentin (NEURONTIN) 300 MG capsule Take 1 capsule (300 mg total) by mouth every morning AND 1 capsule (300 mg total) daily after lunch AND 2 capsules (600 mg total) at bedtime. Patient not taking: Reported on 06/25/2021 05/24/21   [name], MD  ondansetron (ZOFRAN) 8 MG tablet Take 1 tablet (8 mg total) by mouth every  8 (eight) hours as needed for nausea or vomiting. Patient not taking: Reported on 06/25/2021 09/25/14   Jackolyn Confer, MD    Critical care provider statement:   Total critical care time: 33 minutes   Performed by: Lanney Gins MD   Critical care time was exclusive of separately billable procedures and treating other patients.   Critical care was necessary to treat or prevent imminent or life-threatening deterioration.   Critical care was time spent personally by me on the following activities: development of treatment plan with patient and/or surrogate as well as nursing, discussions with consultants, evaluation of patient's response to treatment, examination of patient, obtaining history from patient or surrogate, ordering and performing treatments and interventions, ordering and review of laboratory studies, ordering and review of radiographic studies, pulse oximetry and re-evaluation of patient's condition.    Ottie Glazier, M.D.  Pulmonary & Critical Care Medicine

## 2021-06-26 NOTE — Consult Note (Signed)
Debra Saunders , MD [address] [place], [place], [place], [address] [id number] [place], [place], [place], [address] Phone: [phone]  Fax: [phone]  Consultation  Referring Provider:    Dr Blaine Hamper Primary Care Physician:  [name], MD Primary Gastroenterologist:  Dr. Haig Prophet         Reason for Consultation:     GI bleed  Date of Admission:  06/25/2021 Date of Consultation:  06/26/2021         HPI:   Debra Saunders is a 76 y.o. female who follows with Dr. Haig Prophet in the outpatient gastroenterology last seen on 03/20/2021 for chronic diarrhea which is felt to be related to medications.  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  She is interstitial lung disease on home oxygen and congestive heart failure.  Diarrhea was managed with Imodium and Metamucil.  At that point of time that is very high risk for any procedures due to pulmonary hypertension and oxygen dependency and hence avoided.  She presented to the ER hypotensive, hypoxic and commenced on BiPAP.  Found to have blood cultures positive for gram-positive cocci and Streptococcus.  Commenced on antibiotics.  On x-ray found to have pneumonia.  Commenced on Levophed.  I have been consulted for drop in hemoglobin from 9 g to 7 g with 1 dark stool.  Hemoglobin at baseline is around 9 g dropped to 7 g and is improved to 8.2 g.  Baseline creatinine about 1.04 and presently 3.1.  She says that her last bowel movement was 3 days back.  At that time it was black.  Denies any NSAID use.  Still finds it hard to breathe.  Has pain on the right side of her chest when she takes a deep breath.  Past Medical History:  Diagnosis Date   Arthritis    Atrial fibrillation, persistent ([place])    a. s/p TEE-guided DCCV on 08/20/2017   Cardiorenal syndrome    a. 09/2017 Creat rose to 2.7 w/ diuresis-->HD x 2.   Chest pain    a. 01/2012 Cath: nonobs dzs;  c. 04/2014 MV: No ischemia.   Chronic back pain    Chronic  combined systolic (congestive) and diastolic (congestive) heart failure ([place])    a. Dx 2005 @ [facility];  b. 02/2014 Echo: EF 55-60%; c. 07/2017: EF 30-35%, diffuse HK, trivial AI, severe MR, moderate TR; d. 09/2017 Echo: EF 40-45%; e. 02/2018 Echo: EF 55-60%, Gr1 DD; f. 08/2019 Echo: EF 60-65%, Gr2 DD, Nl RV fxn, RVSP 49.9mmHg. Mod-Sev MR. Mild to mod TR. Mod PR. RA ~ 65mmHg.   Chronic respiratory failure (HCC)    a. on home O2   Gastritis    History of intracranial hemorrhage    a. 6/14 described by neurosurgery as "small frontal posttraumatic SAH " - pt was on xarelto @ the time.   Hyperlipidemia    Hypertension    Interstitial lung disease (HCC)    Lipoma of back    Lymphedema    a. Chronic LLE edema.   NICM (nonischemic cardiomyopathy) ([place])    a. 09/2017 Echo: EF 40-45%; b. 09/2017 Cath: minor, insignificant CAD; c. 02/2018 Echo: EF 55-60%, Gr1 DD; d. 08/2019 Echo: EF 60-65%, Gr2 DD.   Obesity    PAH (pulmonary artery hypertension) ([place])    a. 09/2017 Echo: PASP 16mmHg; b. 09/2017 RHC: RA 23, RV 84/13, PA 84/29, PCWP 20, CO 3.89, CI 1.89. LVEDP 18.   Presence of  permanent cardiac pacemaker 01/09/2014   Sepsis with metabolic encephalopathy ([place])    Sleep apnea    Streptococcal bacteremia    Stroke [facility]) 2014   Subarachnoid hemorrhage ([place]) 01/19/2013   Symptomatic bradycardia    a. s/p BSX PPM.   Thyroid nodule    Type II diabetes mellitus ([place])    Urinary incontinence     Past Surgical History:  Procedure Laterality Date   ABDOMINAL HYSTERECTOMY  1969   BREAST BIOPSY Right 10/28/2019   lymph node bx, [place] Bx, pending path    CARDIAC CATHETERIZATION  2013   @ [place]: No obstructive CAD: Only 20% ostial left circumflex.   CARDIOVERSION N/A 10/12/2017   Procedure: CARDIOVERSION;  Surgeon: Minna Merritts, MD;  Location: [facility] ORS;  Service: Cardiovascular;  Laterality: N/A;   CATARACT EXTRACTION W/PHACO Right 09/19/2020   Procedure: CATARACT EXTRACTION PHACO AND INTRAOCULAR LENS PLACEMENT (IOC)  RIGHT DIABETIC 3.95 00:43.0 9.2%;  Surgeon: Leandrew Koyanagi, MD;  Location: [facility];  Service: Ophthalmology;  Laterality: Right;  Pt requests to be last sleep apnea   CATARACT EXTRACTION W/PHACO Left 10/03/2020   Procedure: CATARACT EXTRACTION PHACO AND INTRAOCULAR LENS PLACEMENT ([place]) LEFT DIABETIC;  Surgeon: Leandrew Koyanagi, MD;  Location: [facility];  Service: Ophthalmology;  Laterality: Left;  3.25 0:59.3 5.5%   CHOLECYSTECTOMY     COLONOSCOPY WITH PROPOFOL N/A 09/10/2018   Procedure: COLONOSCOPY WITH PROPOFOL;  Surgeon: Manya Silvas, MD;  Location: [facility] ENDOSCOPY;  Service: Endoscopy;  Laterality: N/A;   CYST REMOVAL TRUNK  2002   BACK   DIALYSIS/PERMA CATHETER INSERTION N/A 10/06/2017   Procedure: DIALYSIS/PERMA CATHETER INSERTION;  Surgeon: Katha Cabal, MD;  Location: [facility] CV LAB;  Service: Cardiovascular;  Laterality: N/A;   INSERT / REPLACE / REMOVE PACEMAKER     lipoma removal  2014   back   PACEMAKER INSERTION  2015   [employer] dual chamber pacemaker implanted by Dr Caryl Comes for symptomatic bradycardia   PERMANENT PACEMAKER INSERTION N/A 01/09/2014   Procedure: PERMANENT PACEMAKER INSERTION;  Surgeon: Deboraha Sprang, MD;  Location: [facility] CATH LAB;  Service: Cardiovascular;  Laterality: N/A;   RIGHT HEART CATH N/A 04/19/2018   Procedure: RIGHT HEART CATH;  Surgeon: Larey Dresser, MD;  Location: [place] CV LAB;  Service: Cardiovascular;  Laterality: N/A;   RIGHT HEART CATH N/A 08/30/2020   Procedure: RIGHT HEART CATH;  Surgeon: Larey Dresser, MD;  Location: [place] CV LAB;  Service: Cardiovascular;  Laterality: N/A;   RIGHT/LEFT HEART CATH AND CORONARY ANGIOGRAPHY N/A 10/19/2017   Procedure: RIGHT/LEFT HEART CATH AND CORONARY ANGIOGRAPHY;  Surgeon: Wellington Hampshire, MD;  Location: [facility] CV LAB;  Service: Cardiovascular;  Laterality: N/A;   TEE WITHOUT CARDIOVERSION N/A 08/20/2017   Procedure: TRANSESOPHAGEAL  ECHOCARDIOGRAM (TEE) & Direct current cardioversion;  Surgeon: Minna Merritts, MD;  Location: [facility] ORS;  Service: Cardiovascular;  Laterality: N/A;   TRIGGER FINGER RELEASE      Prior to Admission medications   Medication Sig Start Date End Date Taking? Authorizing Provider  acetaminophen (TYLENOL) 325 MG tablet Take 325-650 mg by mouth every 6 (six) hours as needed for mild pain.   Yes [provider]  albuterol (ACCUNEB) 0.63 MG/3ML nebulizer solution Take 1 ampule by nebulization every 6 (six) hours as needed for wheezing.   Yes [provider]  apixaban (ELIQUIS) 5 MG TABS tablet TAKE 1 TABLET BY MOUTH TWICE DAILY . APPOINTMENT REQUIRED FOR FUTURE REFILLS 03/22/21  Yes Wellington Hampshire, MD  cholestyramine Lucrezia Starch) 4 GM/DOSE powder Take 4 g by mouth daily. 08/03/20  Yes [provider]  Difluprednate 0.05 % EMUL Place 1 drop into both eyes 2 (two) times daily.   Yes [provider]  digoxin (LANOXIN) 0.125 MG tablet Take 1 tablet (125 mcg total) by mouth daily. 09/03/20  Yes Larey Dresser, MD  DULoxetine (CYMBALTA) 30 MG capsule TAKE 1 CAPSULE BY MOUTH ONCE DAILY FOR 14 DAYS, THEN INCREASE TO 2 CAPSULES ONCE DAILY Patient taking differently: Take 60 mg by mouth daily. 06/06/21  Yes [name], MD  gabapentin (NEURONTIN) 300 MG capsule Take 600 mg by mouth 3 (three) times daily.   Yes [provider]  KLOR-CON M20 20 MEQ tablet Take 1 tablet by mouth once daily 09/24/20  Yes Larey Dresser, MD  Lancets MISC Use with True Metrix glucometer to check sugars BID 11/14/19  Yes [name], MD  macitentan (OPSUMIT) 10 MG tablet Take 1 tablet (10 mg total) by mouth daily. 01/24/21  Yes Larey Dresser, MD  meclizine (ANTIVERT) 25 MG tablet Take 1 tablet (25 mg total) by mouth 3 (three) times daily as needed for dizziness. 12/25/20  Yes Larey Dresser, MD  Menthol, Topical Analgesic, (BIOFREEZE EX) Apply 1 application topically 4 (four)  times daily as needed (for pain).   Yes [provider]  rosuvastatin (CRESTOR) 10 MG tablet Take 1 tablet by mouth once daily 06/06/21  Yes Larey Dresser, MD  Selexipag (UPTRAVI) 1600 MCG TABS Take 1 tablet (1,600 mcg total) by mouth 2 (two) times daily. 04/19/21  Yes Larey Dresser, MD  sildenafil (REVATIO) 20 MG tablet Take 1 tablet (20 mg total) by mouth 3 (three) times daily. 01/03/21  Yes Larey Dresser, MD  spironolactone (ALDACTONE) 25 MG tablet TAKE 1 TABLET BY MOUTH AT BEDTIME.  PLEASE CALL FOR OFFICE VISIT [phone] 02/04/21  Yes Larey Dresser, MD  torsemide (DEMADEX) 20 MG tablet Take 40 mg by mouth 2 (two) times daily.   Yes [provider]  TRUE METRIX BLOOD GLUCOSE TEST test strip CHECK BLOOD GLUCOSE TWICE DAILY 11/21/20  Yes [name], MD  gabapentin (NEURONTIN) 300 MG capsule Take 1 capsule (300 mg total) by mouth every morning AND 1 capsule (300 mg total) daily after lunch AND 2 capsules (600 mg total) at bedtime. Patient not taking: Reported on 06/25/2021 05/24/21   [name], MD  ondansetron (ZOFRAN) 8 MG tablet Take 1 tablet (8 mg total) by mouth every 8 (eight) hours as needed for nausea or vomiting. Patient not taking: Reported on 06/25/2021 09/25/14   Jackolyn Confer, MD    Family History  Problem Relation Age of Onset   Heart disease Mother    Hypertension Mother    COPD Mother        was a smoker   Thyroid disease Mother    Hypertension Father    Hypertension Sister    Thyroid disease Sister    Breast cancer Sister    Hypertension Sister    Thyroid disease Sister    Bone cancer Sister    Breast cancer Maternal Aunt    Kidney disease Neg Hx    Bladder Cancer Neg Hx      Social History   Tobacco Use   Smoking status: Former    Packs/day: 0.30    Years: 2.00    Pack years: 0.60    Types: Cigarettes  Quit date: 06/23/1990    Years since quitting: 31.0   Smokeless tobacco: Never  Vaping Use   Vaping Use: Never  used  Substance Use Topics   Alcohol use: No   Drug use: No    Allergies as of 06/24/2021 - Review Complete 06/24/2021  Allergen Reaction Noted   Aspirin Hives and Other (See Comments) 03/06/2014   Other Other (See Comments) 04/12/2013   Penicillins Hives, Shortness Of Breath, Swelling, and Other (See Comments) 01/15/2012    Review of Systems:    All systems reviewed and negative except where noted in HPI.   Physical Exam:  Vital signs in last 24 hours: Temp:  [97.8 F (36.6 C)-98.2 F (36.8 C)] 98 F (36.7 C) (01/03 1852) Pulse Rate:  [50-78] 70 (01/04 0810) Resp:  [16-31] 20 (01/04 0810) BP: (86-149)/(38-89) 117/61 (01/04 0810) SpO2:  [95 %-100 %] 96 % (01/04 0945)   General:   Pleasant, cooperative in NAD Head:  Normocephalic and atraumatic. Eyes:   No icterus.   Conjunctiva pink. PERRLA. Ears:  Normal auditory acuity. Neck:  Supple; no masses or thyroidomegaly Lungs: Decreased air entry bilaterally.  Heart:  Regular rate and rhythm;  Without murmur, clicks, rubs or gallops Abdomen:  Soft, nondistended, mild tenderness in the right side of the abdomen normal bowel sounds. No appreciable masses or hepatomegaly.  No rebound or guarding.  Neurologic:  Alert and oriented x3;  grossly normal neurologically. Skin:  Intact without significant lesions or rashes. Cervical Nodes:  No significant cervical adenopathy. Psych:  Alert and cooperative. Normal affect.  LAB RESULTS: Recent Labs    06/24/21 1456 06/25/21 1350 06/25/21 2243  WBC 20.1* 18.3* 22.8*  HGB 8.1* 7.0* 8.2*  HCT 25.0* 22.0* 25.3*  PLT 99* 75* 87*   BMET Recent Labs    06/24/21 1456 06/25/21 1350 06/25/21 2243  NA 128* 126* 124*  K 4.2 4.9 5.1  CL 101 98 98  CO2 19* 20* 15*  GLUCOSE 122* 171* 201*  BUN 57* 74* 83*  CREATININE 2.27* 3.11* 3.21*  CALCIUM 8.2* 7.9* 8.0*   LFT Recent Labs    06/25/21 1350  PROT 6.2*  ALBUMIN 2.2*  AST 21  ALT 13  ALKPHOS 126  BILITOT 3.0*  BILIDIR 1.7*   IBILI 1.3*   PT/INR Recent Labs    06/25/21 0258  LABPROT 23.6*  INR 2.1*    STUDIES: CT ABDOMEN PELVIS WO CONTRAST  Result Date: 06/24/2021 CLINICAL DATA:  Abdominal pain, nonlocalized. Diarrhea loss of appetite for past few days. EXAM: CT ABDOMEN AND PELVIS WITHOUT CONTRAST TECHNIQUE: Multidetector CT imaging of the abdomen and pelvis was performed following the standard protocol without IV contrast. COMPARISON:  CT examination dated October 15, 2020 FINDINGS: Lower chest: No acute abnormality. Hepatobiliary: No focal liver abnormality is seen. Status post cholecystectomy. No biliary dilatation. Pancreas: Unremarkable. No pancreatic ductal dilatation or surrounding inflammatory changes. Spleen: Normal in size without focal abnormality. Adrenals/Urinary Tract: Adrenal glands are unremarkable. Kidneys are normal, without renal calculi, focal lesion, or hydronephrosis. Bladder is unremarkable. Stomach/Bowel: Stomach is within normal limits. Appendix not visualized. No evidence of bowel wall thickening, distention, or inflammatory changes. Colonic diverticulosis without evidence of acute diverticulitis. Vascular/Lymphatic: Aortic atherosclerosis. No enlarged abdominal or pelvic lymph nodes. IVC filter is noted. Reproductive: Status post hysterectomy. Stable hypodense cystic structures in bilateral adnexal region. Other: No abdominal wall hernia or abnormality. No abdominopelvic ascites. Musculoskeletal: Degenerate disc disease of the lumbar spine with mild levoscoliosis. No acute osseous abnormality.  IMPRESSION: 1. Sigmoid colonic diverticulosis without evidence of acute diverticulitis. 2. Bowel loops are normal in caliber without evidence of obstruction or significant wall thickening. 3. Status post cholecystectomy and hysterectomy. 4. 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 IVC filter is present. Electronically Signed   By: Keane Police D.O.   On: 06/24/2021 16:17   DG Chest  Portable 1 View  Result Date: 06/25/2021 CLINICAL DATA:  Chest and back pain with diarrhea. EXAM: PORTABLE CHEST 1 VIEW COMPARISON:  01/15/2021 FINDINGS: 0459 hours. Stable asymmetric elevation right hemidiaphragm. Diffuse interstitial opacity again noted with new areas of patchy airspace disease in the right upper lobe and medial right base. No substantial pleural effusion. Left-sided permanent pacemaker again noted. Telemetry leads overlie the chest. IMPRESSION: Interval development of patchy airspace disease in the right upper lobe and medial right base. Multifocal pneumonia concern. Electronically Signed   By: Misty Stanley M.D.   On: 06/25/2021 05:23   US Abdomen Limited RUQ (LIVER/GB)  Result Date: 06/26/2021 CLINICAL DATA:  Right upper quadrant pain. EXAM: ULTRASOUND ABDOMEN LIMITED RIGHT UPPER QUADRANT COMPARISON:  CT abdomen/pelvis 06/24/2021. FINDINGS: Gallbladder: Cholecystectomy. Common bile duct: Diameter: 5 mm, within normal limits. Liver: No focal lesion identified. Within normal limits in parenchymal echogenicity. Portal vein is patent on color Doppler imaging with normal direction of blood flow towards the liver. IMPRESSION: Cholecystectomy.  No evidence of acute abnormality. Electronically Signed   By: Margaretha Sheffield M.D.   On: 06/26/2021 09:28      Impression / Plan:   Debra Saunders is a 76 y.o. y/o female with a history of interstitial lung disease on long-term oxygen, pulmonary hypertension presented to the emergency room with septic shock likely secondary to bacteremia from underlying pneumonia affecting the right upper lobe and lower lobe.  Admission complicated by AKI.  The patient is on a blood thinner and I have been consulted for drop in hemoglobin from 9 g at baseline to 7 g.  Improved to 8 g with a unit of blood transfusion and 1 episode of dark stools.  She previously has followed with Dr. Haig Prophet as an outpatient for chronic diarrhea felt to be secondary to medications  used to treat pulmonary hypertension.  At that point of time it was felt that any endoscopy procedure was high risk due to her comorbidities.  At this point of time I think she is very sick to perform a colonoscopy or an endoscopy unless it is life-threatening.  Her troponin is 201 which is elevated.  I would suggest to treat her conservatively with monitoring her CBC closely and transfuse as needed, IV PPI.  If possible hold blood thinners.  If there is an acute hemorrhage consider CT angiogram or tagged RBC scan.  If there is no other option to evaluate a GI bleed and endoscopy currently option, then  should probably be the last option to evaluate her.  She would be at a very high risk for anesthesia complications with her comorbidities at this point of time.  The pain in the right side of her chest and abdomen when she takes a deep breath is probably related to the pneumonia.  She states the pain has been improving.  We can closely monitor  Thank you for involving me in the care of this patient.      LOS: 1 day   Debra Bellows, MD  06/26/2021, 9:49 AM

## 2021-06-26 NOTE — ED Notes (Signed)
Moved pt into hospital bed. Purewick changed. Daughter back at bedside.

## 2021-06-26 NOTE — ED Notes (Signed)
Sent msg to attending to inform of BP trends and current meds and to ask whether to still turn off levophed which is currently infusing at 73mcg/min.

## 2021-06-26 NOTE — Progress Notes (Signed)
PROGRESS NOTE    Debra Saunders  ZOX:[id number] DOB: 1945-08-06 DOA: 06/25/2021 PCP: [name], MD    Brief Narrative:  76 year old female with history of hypertension, hyperlipidemia, type 2 diabetes, SA node dysfunction and status post pacemaker sleep apnea, interstitial lung disease and chronic hypoxemia on 4 L of oxygen at home, diastolic congestive heart failure, chronic A. fib on Eliquis, CKD stage III AAA, cardiorenal syndrome, DVT on Eliquis presented to the ER with shortness of breath, cough fever and chills, dysuria and right upper quadrant abdominal pain for almost 3 weeks. In the emergency room initially hypotensive with blood pressure 73/26 that improved to 103/2:54 liters Ringer lactate bolus.  She was 87% on 4 L home oxygen.  Started on BiPAP for respiratory distress.  WBC count 20,000.  Hemoglobin 8.1-7.  COVID-19 negative.  INR 2.  FOBT negative.  Lactic acid 1.  Abnormal urinalysis.  Sodium 128-124.  Temperature 100.1.  Chest x-ray with multifocal infiltrate.  CT scan abdomen pelvis negative for any acute intra-abdominal issues.  Blood cultures subsequently growing gram-positive cocci with a Streptococcus.   Assessment & Plan:   Principal Problem:   Severe sepsis with septic shock (HCC) Active Problems:   Pulmonary hypertension, unspecified (HCC)   Acute on chronic respiratory failure with hypoxia (HCC)   Normocytic anemia   Interstitial lung disease (HCC)   UTI (urinary tract infection)   Atrial fibrillation (HCC)   Multifocal pneumonia   HTN (hypertension)   HLD (hyperlipidemia)   Chronic diastolic CHF (congestive heart failure) (HCC)   Acute renal failure superimposed on stage 3a chronic kidney disease (HCC)   DVT (deep venous thrombosis) (HCC)   Hyponatremia   Thrombocytopenia (HCC)   Diarrhea   Bacteremia due to Gram-positive bacteria   GI bleeding   Black stool  Severe sepsis with septic shock secondary to multifocal pneumonia.  UTI and bacteremia  due to gram-positive bacteria: Streptococcal bacteremia. Resuscitated with IV fluid and on low-dose Levophed overnight through peripheral IV.  Blood pressures adequate today.  Will wean off the Levophed. Initially on vancomycin and cefepime, now changed to Rocephin and azithromycin with Streptococcus bacteremia. Bronchodilators.  Chest physiotherapy.  Follow-up blood cultures, sputum cultures. Recheck blood cultures today.  Echocardiogram to rule out endocarditis/pacemaker vegetation.  Acute on chronic hypoxemic respiratory failure due to multifocal pneumonia: Was on BiPAP.  Currently on nasal cannula oxygen.  Pulmonary hypertension: Uses sildenafil, macitentan. On hold due to hypotension.  Continue to hold.  Acute anemia: Suspected acute blood loss anemia. Baseline hemoglobin 9-presented with hemoglobin 8-dropped to 7.  1 unit PRBC given.  Patient anticoagulated with Eliquis.  Holding Eliquis.  IV Protonix.  Avoid NSAIDs. Close monitoring of hemoglobin. Seen by gastroenterology, not planning for any endoscopic evaluation today.  Planning conservative management.  Acute renal failure superimposed on stage IIIa chronic kidney disease/hyponatremia: Likely multifactorial including UTI, dehydration with continued sign of diuretics, ATN from hypotension.  Holding all diuretics.  IV fluid as above. Nephrology consulted. Recent renal functions with creatinine 1.04-1.14. Started on bicarbonate infusion by nephrology.  We will check BMP every 12 hours.  Essential hypertension: Blood pressure medications on hold.  Hyperlipidemia: On Crestor continued.  Suspected UTI present on admission: Growing Klebsiella.  On Rocephin.  Follow-up urine cultures.  Sick sinus syndrome: On pacemaker.  Abnormal LFTs: Suspect shock liver.  No evidence of bile duct obstruction.  Improving.  Status postcholecystectomy.   DVT prophylaxis:   SCDs.   Code Status: Full code Family Communication: None  Disposition  Plan: Status is: Inpatient  Remains inpatient appropriate because: Significant hemodynamic instability.         Consultants:  Critical care GI Nephrology  Procedures:  None  Antimicrobials:  Rocephin azithromycin 1/3---   Subjective: Patient seen and examined.  She was still in the emergency room in the morning rounds.  She was back on 4 L of oxygen.  Denies any nausea.  She has chronic diarrhea but no bowel movement since admission. Patient complains of right upper quadrant abdominal pain mostly with deep breathing and mobility.  Objective: Vitals:   06/26/21 1230 06/26/21 1300 06/26/21 1330 06/26/21 1400  BP: (!) 102/49 (!) 110/49 (!) 137/51 (!) 92/48  Pulse: 70 69 68 70  Resp: 20 (!) 21 20 20   Temp:      TempSrc:      SpO2: 98% 98% 97% 96%  Weight:        Intake/Output Summary (Last 24 hours) at 06/26/2021 1418 Last data filed at 06/26/2021 1344 Gross per 24 hour  Intake 1518.96 ml  Output 450 ml  Net 1068.96 ml   Filed Weights   06/25/21 0851  Weight: 87.1 kg    Examination:  General exam: Appears calm and comfortable. Frail and debilitated. Not in any distress.  Currently on 4 L oxygen. Respiratory system: Some conducted airway sounds.  No added sounds. Cardiovascular system: S1 & S2 heard, RRR.  Pacemaker in place.   Gastrointestinal system: Mildly tender right upper quadrant.  No rigidity or guarding.  Bowel sound present. Central nervous system: Alert and oriented. No focal neurological deficits. Extremities: Symmetric 5 x 5 power. Skin: No rashes, lesions or ulcers Psychiatry: Judgement and insight appear normal. Mood & affect appropriate.     Data Reviewed: I have personally reviewed following labs and imaging studies  CBC: Recent Labs  Lab 06/24/21 1456 06/25/21 1350 06/25/21 2243 06/26/21 0847  WBC 20.1* 18.3* 22.8* 14.3*  HGB 8.1* 7.0* 8.2* 8.2*  HCT 25.0* 22.0* 25.3* 25.8*  MCV 85.0 85.9 85.5 84.3  PLT 99* 75* 87* 93*   Basic  Metabolic Panel: Recent Labs  Lab 06/24/21 1456 06/25/21 1350 06/25/21 2243 06/26/21 0847  NA 128* 126* 124* 124*  K 4.2 4.9 5.1 5.0  CL 101 98 98 95*  CO2 19* 20* 15* 18*  GLUCOSE 122* 171* 201* 224*  BUN 57* 74* 83* 96*  CREATININE 2.27* 3.11* 3.21* 3.02*  CALCIUM 8.2* 7.9* 8.0* 7.7*   GFR: Estimated Creatinine Clearance: 17.7 mL/min (A) (by C-G formula based on SCr of 3.02 mg/dL (H)). Liver Function Tests: Recent Labs  Lab 06/24/21 1456 06/25/21 1350 06/26/21 0847  AST 19 21 16   ALT 12 13 11   ALKPHOS 128* 126 137*  BILITOT 2.7* 3.0* 1.9*  PROT 6.3* 6.2* 6.6  ALBUMIN 2.5* 2.2* 2.3*   Recent Labs  Lab 06/24/21 1456  LIPASE 45   No results for input(s): AMMONIA in the last 168 hours. Coagulation Profile: Recent Labs  Lab 06/25/21 0258  INR 2.1*   Cardiac Enzymes: No results for input(s): CKTOTAL, CKMB, CKMBINDEX, TROPONINI in the last 168 hours. BNP (last 3 results) No results for input(s): PROBNP in the last 8760 hours. HbA1C: Recent Labs    06/25/21 0347  HGBA1C 5.4   CBG: Recent Labs  Lab 06/26/21 0747  GLUCAP 212*   Lipid Profile: Recent Labs    06/26/21 0847  CHOL 89  HDL <10*  LDLCALC NOT CALCULATED  TRIG 132  CHOLHDL NOT CALCULATED  Thyroid Function Tests: No results for input(s): TSH, T4TOTAL, FREET4, T3FREE, THYROIDAB in the last 72 hours. Anemia Panel: No results for input(s): VITAMINB12, FOLATE, FERRITIN, TIBC, IRON, RETICCTPCT in the last 72 hours. Sepsis Labs: Recent Labs  Lab 06/25/21 0347 06/25/21 0433 06/25/21 1350 06/26/21 0847  PROCALCITON 18.79  --   --  12.96  LATICACIDVEN  --  1.0 1.0  --     Recent Results (from the past 240 hour(s))  Blood culture (routine x 2)     Status: None (Preliminary result)   Collection Time: 06/25/21  2:33 AM   Specimen: BLOOD RIGHT FOREARM  Result Value Ref Range Status   Specimen Description   Final    BLOOD RIGHT FOREARM Performed at [facility], [address] [place], [place] [address]    Special Requests   Final    BOTTLES DRAWN AEROBIC AND ANAEROBIC Blood Culture results may not be optimal due to an inadequate volume of blood received in culture bottles Performed at [facility], [address] [place], [place] [address]    Culture  Setup Time   Final    GRAM POSITIVE COCCI IN BOTH AEROBIC AND ANAEROBIC BOTTLES Organism ID to follow CRITICAL RESULT CALLED TO, READ BACK BY AND VERIFIED WITHArdeen Garland RDEYCX [id number] 06/25/21 HNM Performed at [facility], [address], [place], [place] [address]    Culture   Final    Lonell Grandchild POSITIVE COCCI TOO YOUNG TO READ Performed at [facility], [place] [address] [place], [place] [address]    Report Status PENDING  Incomplete  Blood Culture ID Panel (Reflexed)     Status: Abnormal   Collection Time: 06/25/21  2:33 AM  Result Value Ref Range Status   Enterococcus faecalis NOT DETECTED NOT DETECTED Final   Enterococcus Faecium NOT DETECTED NOT DETECTED Final   Listeria monocytogenes NOT DETECTED NOT DETECTED Final   Staphylococcus species NOT DETECTED NOT DETECTED Final   Staphylococcus aureus (BCID) NOT DETECTED NOT DETECTED Final   Staphylococcus epidermidis NOT DETECTED NOT DETECTED Final   Staphylococcus lugdunensis NOT DETECTED NOT DETECTED Final   Streptococcus species DETECTED (A) NOT DETECTED Final    Comment: Not Enterococcus species, Streptococcus agalactiae, Streptococcus pyogenes, or Streptococcus pneumoniae. CRITICAL RESULT CALLED TO, READ BACK BY AND VERIFIED WITH: [place] OVZCHY [id number] 06/25/21 HNM    Streptococcus agalactiae NOT DETECTED NOT DETECTED Final   Streptococcus pneumoniae NOT DETECTED NOT DETECTED Final   Streptococcus pyogenes NOT DETECTED NOT DETECTED Final   A.calcoaceticus-baumannii NOT DETECTED NOT DETECTED Final   Bacteroides fragilis NOT DETECTED NOT DETECTED Final   Enterobacterales NOT DETECTED NOT DETECTED Final   Enterobacter cloacae  complex NOT DETECTED NOT DETECTED Final   Escherichia coli NOT DETECTED NOT DETECTED Final   Klebsiella aerogenes NOT DETECTED NOT DETECTED Final   Klebsiella oxytoca NOT DETECTED NOT DETECTED Final   Klebsiella pneumoniae NOT DETECTED NOT DETECTED Final   Proteus species NOT DETECTED NOT DETECTED Final   Salmonella species NOT DETECTED NOT DETECTED Final   Serratia marcescens NOT DETECTED NOT DETECTED Final   Haemophilus influenzae NOT DETECTED NOT DETECTED Final   Neisseria meningitidis NOT DETECTED NOT DETECTED Final   Pseudomonas aeruginosa NOT DETECTED NOT DETECTED Final   Stenotrophomonas maltophilia NOT DETECTED NOT DETECTED Final   Candida albicans NOT DETECTED NOT DETECTED Final   Candida auris NOT DETECTED NOT DETECTED Final   Candida glabrata NOT DETECTED NOT DETECTED Final   Candida krusei NOT  DETECTED NOT DETECTED Final   Candida parapsilosis NOT DETECTED NOT DETECTED Final   Candida tropicalis NOT DETECTED NOT DETECTED Final   Cryptococcus neoformans/gattii NOT DETECTED NOT DETECTED Final    Comment: Performed at [facility], [address] [place], [place] [address]  Resp Panel by RT-PCR (Flu A&B, Covid) Nasopharyngeal Swab     Status: None   Collection Time: 06/25/21  3:33 AM   Specimen: Nasopharyngeal Swab; Nasopharyngeal(NP) swabs in vial transport medium  Result Value Ref Range Status   SARS Coronavirus 2 by RT PCR NEGATIVE NEGATIVE Final    Comment: (NOTE) SARS-CoV-2 target nucleic acids are NOT DETECTED.  The SARS-CoV-2 RNA is generally detectable in upper respiratory specimens during the acute phase of infection. The lowest concentration of SARS-CoV-2 viral copies this assay can detect is 138 copies/mL. A negative result does not preclude SARS-Cov-2 infection and should not be used as the sole basis for treatment or other patient management decisions. A negative result may occur with  improper specimen collection/handling, submission of specimen  other than nasopharyngeal swab, presence of viral mutation(s) within the areas targeted by this assay, and inadequate number of viral copies(<138 copies/mL). A negative result must be combined with clinical observations, patient history, and epidemiological information. The expected result is Negative.  Fact Sheet for Patients:  EntrepreneurPulse.com.au  Fact Sheet for Healthcare Providers:  IncredibleEmployment.be  This test is no t yet approved or cleared by the [place] FDA and  has been authorized for detection and/or diagnosis of SARS-CoV-2 by FDA under an Emergency Use Authorization (EUA). This EUA will remain  in effect (meaning this test can be used) for the duration of the COVID-19 declaration under Section 564(b)(1) of the Act, 21 U.S.C.section 360bbb-3(b)(1), unless the authorization is terminated  or revoked sooner.       Influenza A by PCR NEGATIVE NEGATIVE Final   Influenza B by PCR NEGATIVE NEGATIVE Final    Comment: (NOTE) The Xpert Xpress SARS-CoV-2/FLU/RSV plus assay is intended as an aid in the diagnosis of influenza from Nasopharyngeal swab specimens and should not be used as a sole basis for treatment. Nasal washings and aspirates are unacceptable for Xpert Xpress SARS-CoV-2/FLU/RSV testing.  Fact Sheet for Patients: EntrepreneurPulse.com.au  Fact Sheet for Healthcare Providers: IncredibleEmployment.be  This test is not yet approved or cleared by the [place] FDA and has been authorized for detection and/or diagnosis of SARS-CoV-2 by FDA under an Emergency Use Authorization (EUA). This EUA will remain in effect (meaning this test can be used) for the duration of the COVID-19 declaration under Section 564(b)(1) of the Act, 21 U.S.C. section 360bbb-3(b)(1), unless the authorization is terminated or revoked.  Performed at [facility], [address]  [place], [place] [address]   Blood culture (routine x 2)     Status: None (Preliminary result)   Collection Time: 06/25/21  5:55 AM   Specimen: BLOOD  Result Value Ref Range Status   Specimen Description   Final    BLOOD LEFT ARM Performed at [facility], [address] [place], [place] [address]    Special Requests   Final    BOTTLES DRAWN AEROBIC AND ANAEROBIC Blood Culture adequate volume Performed at [facility], [address] [place], [place] [address]    Culture  Setup Time   Final    GRAM POSITIVE COCCI IN BOTH AEROBIC AND ANAEROBIC BOTTLES CRITICAL VALUE NOTED.  VALUE IS CONSISTENT WITH PREVIOUSLY REPORTED AND CALLED VALUE. Performed at [employer]  [facility], [address], [place], [place] [address]    Culture   Final    Lonell Grandchild POSITIVE COCCI TOO YOUNG TO READ Performed at [facility], [place] [address], [place], [place] [address]    Report Status PENDING  Incomplete  Urine Culture     Status: Abnormal (Preliminary result)   Collection Time: 06/25/21  6:55 AM   Specimen: Urine, Clean Catch  Result Value Ref Range Status   Specimen Description   Final    URINE, CLEAN CATCH Performed at [facility], [address] [place], [place] [address]    Special Requests   Final    NONE Performed at [facility], [address], [place], [place] [address]    Culture (A)  Final    >=100,000 COLONIES/mL KLEBSIELLA PNEUMONIAE SUSCEPTIBILITIES TO FOLLOW Performed at [facility], [place] [address], [place], [place] [address]    Report Status PENDING  Incomplete  MRSA Next Gen by PCR, Nasal     Status: None   Collection Time: 06/25/21 10:55 PM   Specimen: Nasal Mucosa; Nasal Swab  Result Value Ref Range Status   MRSA by PCR Next Gen NOT DETECTED NOT DETECTED Final    Comment: (NOTE) [name] (FDA approved for NASAL specimens only), is one component of a comprehensive MRSA colonization surveillance program. It  is not intended to diagnose MRSA infection nor to guide or monitor treatment for MRSA infections. Test performance is not FDA approved in patients less than 55 years old. Performed at [facility], [address] [place], [place] [address]          Radiology Studies: CT ABDOMEN PELVIS WO CONTRAST  Result Date: 06/24/2021 CLINICAL DATA:  Abdominal pain, nonlocalized. Diarrhea loss of appetite for past few days. EXAM: CT ABDOMEN AND PELVIS WITHOUT CONTRAST TECHNIQUE: Multidetector CT imaging of the abdomen and pelvis was performed following the standard protocol without IV contrast. COMPARISON:  CT examination dated October 15, 2020 FINDINGS: Lower chest: No acute abnormality. Hepatobiliary: No focal liver abnormality is seen. Status post cholecystectomy. No biliary dilatation. Pancreas: Unremarkable. No pancreatic ductal dilatation or surrounding inflammatory changes. Spleen: Normal in size without focal abnormality. Adrenals/Urinary Tract: Adrenal glands are unremarkable. Kidneys are normal, without renal calculi, focal lesion, or hydronephrosis. Bladder is unremarkable. Stomach/Bowel: Stomach is within normal limits. Appendix not visualized. No evidence of bowel wall thickening, distention, or inflammatory changes. Colonic diverticulosis without evidence of acute diverticulitis. Vascular/Lymphatic: Aortic atherosclerosis. No enlarged abdominal or pelvic lymph nodes. IVC filter is noted. Reproductive: Status post hysterectomy. Stable hypodense cystic structures in bilateral adnexal region. Other: No abdominal wall hernia or abnormality. No abdominopelvic ascites. Musculoskeletal: Degenerate disc disease of the lumbar spine with mild levoscoliosis. No acute osseous abnormality. IMPRESSION: 1. Sigmoid colonic diverticulosis without evidence of acute diverticulitis. 2. Bowel loops are normal in caliber without evidence of obstruction or significant wall thickening. 3. Status post cholecystectomy  and hysterectomy. 4. 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 IVC filter is present. Electronically Signed   By: Keane Police D.O.   On: 06/24/2021 16:17   DG Chest Portable 1 View  Result Date: 06/25/2021 CLINICAL DATA:  Chest and back pain with diarrhea. EXAM: PORTABLE CHEST 1 VIEW COMPARISON:  01/15/2021 FINDINGS: 0459 hours. Stable asymmetric elevation right hemidiaphragm. Diffuse interstitial opacity again noted with new areas of patchy airspace disease in the right upper lobe and medial right base. No substantial pleural effusion. Left-sided permanent pacemaker  again noted. Telemetry leads overlie the chest. IMPRESSION: Interval development of patchy airspace disease in the right upper lobe and medial right base. Multifocal pneumonia concern. Electronically Signed   By: Misty Stanley M.D.   On: 06/25/2021 05:23   US Abdomen Limited RUQ (LIVER/GB)  Result Date: 06/26/2021 CLINICAL DATA:  Right upper quadrant pain. EXAM: ULTRASOUND ABDOMEN LIMITED RIGHT UPPER QUADRANT COMPARISON:  CT abdomen/pelvis 06/24/2021. FINDINGS: Gallbladder: Cholecystectomy. Common bile duct: Diameter: 5 mm, within normal limits. Liver: No focal lesion identified. Within normal limits in parenchymal echogenicity. Portal vein is patent on color Doppler imaging with normal direction of blood flow towards the liver. IMPRESSION: Cholecystectomy.  No evidence of acute abnormality. Electronically Signed   By: Margaretha Sheffield M.D.   On: 06/26/2021 09:28        Scheduled Meds:  cholestyramine  4 g Oral Daily   Difluprednate  1 drop Both Eyes BID   DULoxetine  60 mg Oral Daily   gabapentin  300 mg Oral BID   ipratropium-albuterol  3 mL Nebulization Q4H   methylPREDNISolone (SOLU-MEDROL) injection  40 mg Intravenous Q12H   midodrine       midodrine  10 mg Oral TID WC   pantoprazole (PROTONIX) IV  40 mg Intravenous Q12H   rosuvastatin  10 mg Oral Daily   Continuous Infusions:  azithromycin Stopped  (06/26/21 1000)   cefTRIAXone (ROCEPHIN)  IV Stopped (06/26/21 0848)   norepinephrine (LEVOPHED) Adult infusion Stopped (06/26/21 1333)    sodium bicarbonate (isotonic) infusion in sterile water 100 mL/hr at 06/26/21 1101     LOS: 1 day    Time spent: 40 minutes    Barb Merino, MD [facility] Pager [phone]

## 2021-06-26 NOTE — Consult Note (Signed)
NAME: Debra Saunders  DOB: Sep 11, 1945  MRN: [id number]  Date/Time: 06/26/2021 11:42 PM  REQUESTING PROVIDER: Dr. Lovena Neighbours Subjective:  REASON FOR CONSULT: Bacteremia ? Debra Saunders is a 76 y.o. 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 As per daughters at bedside patient had respiratory illness a few weeks ago and they thought it was fluid but it got better.  And then last week when the weather was very cold she tried to close her window and got caught in the cold dropped and has been sick since then.  She has got cough withMucus production.  She has been short of breath.  She recently started developing fever and chills. She has had some loose stools.  Dark in color. In the ED on 06/24/2021 temperature was 100.1, BP 113/51, heart rate 96 and sats 97% on 4 L oxygen.  Labs revealed WBC of 20.1, Hb 8.1, platelet 99 and creatinine was 2.27.  Blood cultures were sent.Chest x-ray showed ID diffuse interstitial opacity with new areas of right upper lobe and medial right base opacity. Pt is c/o rt sided lower chest pain- had US of the rt upper quadrant- cholecystectomy, bot no other findings  Past Medical History:  Diagnosis Date   Arthritis    Atrial fibrillation, persistent ([place])    a. s/p TEE-guided DCCV on 08/20/2017   Cardiorenal syndrome    a. 09/2017 Creat rose to 2.7 w/ diuresis-->HD x 2.   Chest pain    a. 01/2012 Cath: nonobs dzs;  c. 04/2014 MV: No ischemia.   Chronic back pain    Chronic combined systolic (congestive) and diastolic (congestive) heart failure ([place])    a. Dx 2005 @ [facility];  b. 02/2014 Echo: EF 55-60%; c. 07/2017: EF 30-35%, diffuse HK, trivial AI, severe MR,  moderate TR; d. 09/2017 Echo: EF 40-45%; e. 02/2018 Echo: EF 55-60%, Gr1 DD; f. 08/2019 Echo: EF 60-65%, Gr2 DD, Nl RV fxn, RVSP 49.42mmHg. Mod-Sev MR. Mild to mod TR. Mod PR. RA ~ 19mmHg.   Chronic respiratory failure (HCC)    a. on home O2   Gastritis    History of intracranial hemorrhage    a. 6/14 described by neurosurgery as "small frontal posttraumatic SAH " - pt was on xarelto @ the time.   Hyperlipidemia    Hypertension    Interstitial lung disease (HCC)    Lipoma of back    Lymphedema    a. Chronic LLE edema.   NICM (nonischemic cardiomyopathy) ([place])    a. 09/2017 Echo: EF 40-45%; b. 09/2017 Cath: minor, insignificant CAD; c. 02/2018 Echo: EF 55-60%, Gr1 DD; d. 08/2019 Echo: EF 60-65%, Gr2 DD.   Obesity    PAH (pulmonary artery hypertension) ([place])    a. 09/2017 Echo: PASP 50mmHg; b. 09/2017 RHC: RA 23, RV 84/13, PA 84/29, PCWP 20, CO 3.89, CI 1.89. LVEDP 18.   Presence of permanent cardiac pacemaker 01/09/2014   Sepsis with metabolic encephalopathy ([place])    Sleep apnea    Streptococcal bacteremia    Stroke [facility]) 2014   Subarachnoid hemorrhage ([place]) 01/19/2013   Symptomatic bradycardia    a. s/p BSX PPM.   Thyroid nodule    Type II diabetes mellitus ([place])    Urinary incontinence  Past Surgical History:  Procedure Laterality Date   ABDOMINAL HYSTERECTOMY  1969   BREAST BIOPSY Right 10/28/2019   lymph node bx, [place] Bx, pending path    CARDIAC CATHETERIZATION  2013   @ [facility]: No obstructive CAD: Only 20% ostial left circumflex.   CARDIOVERSION N/A 10/12/2017   Procedure: CARDIOVERSION;  Surgeon: Minna Merritts, MD;  Location: [facility] ORS;  Service: Cardiovascular;  Laterality: N/A;   CATARACT EXTRACTION W/PHACO Right 09/19/2020   Procedure: CATARACT EXTRACTION PHACO AND INTRAOCULAR LENS PLACEMENT (IOC) RIGHT DIABETIC 3.95 00:43.0 9.2%;  Surgeon: Leandrew Koyanagi, MD;  Location: [place];  Service: Ophthalmology;  Laterality: Right;  Pt requests to be last sleep apnea    CATARACT EXTRACTION W/PHACO Left 10/03/2020   Procedure: CATARACT EXTRACTION PHACO AND INTRAOCULAR LENS PLACEMENT ([place]) LEFT DIABETIC;  Surgeon: Leandrew Koyanagi, MD;  Location: [place];  Service: Ophthalmology;  Laterality: Left;  3.25 0:59.3 5.5%   CHOLECYSTECTOMY     COLONOSCOPY WITH PROPOFOL N/A 09/10/2018   Procedure: COLONOSCOPY WITH PROPOFOL;  Surgeon: Manya Silvas, MD;  Location: [facility] ENDOSCOPY;  Service: Endoscopy;  Laterality: N/A;   CYST REMOVAL TRUNK  2002   BACK   DIALYSIS/PERMA CATHETER INSERTION N/A 10/06/2017   Procedure: DIALYSIS/PERMA CATHETER INSERTION;  Surgeon: Katha Cabal, MD;  Location: [place] CV LAB;  Service: Cardiovascular;  Laterality: N/A;   INSERT / REPLACE / REMOVE PACEMAKER     lipoma removal  2014   back   PACEMAKER INSERTION  2015   [employer] dual chamber pacemaker implanted by Dr Caryl Comes for symptomatic bradycardia   PERMANENT PACEMAKER INSERTION N/A 01/09/2014   Procedure: PERMANENT PACEMAKER INSERTION;  Surgeon: Deboraha Sprang, MD;  Location: [facility] CATH LAB;  Service: Cardiovascular;  Laterality: N/A;   RIGHT HEART CATH N/A 04/19/2018   Procedure: RIGHT HEART CATH;  Surgeon: Larey Dresser, MD;  Location: [place] CV LAB;  Service: Cardiovascular;  Laterality: N/A;   RIGHT HEART CATH N/A 08/30/2020   Procedure: RIGHT HEART CATH;  Surgeon: Larey Dresser, MD;  Location: [place] [facility];  Service: Cardiovascular;  Laterality: N/A;   RIGHT/LEFT HEART CATH AND CORONARY ANGIOGRAPHY N/A 10/19/2017   Procedure: RIGHT/LEFT HEART CATH AND CORONARY ANGIOGRAPHY;  Surgeon: Wellington Hampshire, MD;  Location: [place] CV LAB;  Service: Cardiovascular;  Laterality: N/A;   TEE WITHOUT CARDIOVERSION N/A 08/20/2017   Procedure: TRANSESOPHAGEAL ECHOCARDIOGRAM (TEE) & Direct current cardioversion;  Surgeon: Minna Merritts, MD;  Location: [facility] OR[facility];  Service: Cardiovascular;  Laterality: N/A;   TRIGGER FINGER RELEASE       Social History   Socioeconomic History   Marital status: Divorced    Spouse name: Not on file   Number of children: 4   Years of education: 12   Highest education level: High school graduate  Occupational History   Occupation: Retired- factory work    Comment: 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  Has 4 children. No pets.      Work - Restaurant manager, fast food, retired      Diet - regular      02/14/2019: reports has Meals On Wheels delivered to her home;       Remote hx of smoking > 30 years; No alcohol-    Social Determinants of Health   Financial Resource Strain: Low Risk    Difficulty of Paying Living Expenses: Not very hard  Food Insecurity: No Food Insecurity   Worried About Charity fundraiser in the Last Year: Never true   Ran Out of Food in the Last Year: Never true  Transportation Needs: No Transportation Needs   Lack of Transportation (Medical): No   Lack of Transportation (Non-Medical): No  Physical Activity: Not on file  Stress: No Stress Concern Present   Feeling of Stress : Only a little  Social Connections: Socially Isolated   Frequency of Communication with Friends and Family: More than three times a week   Frequency of Social Gatherings with Friends and Family: Once a week   Attends Religious Services: Never   Marine scientist or Organizations: No   Attends Music therapist: Never   Marital Status: Divorced  Human resources officer Violence: Not At Risk   Fear of Current or Ex-Partner: No   Emotionally Abused: No   Physically Abused:  No   Sexually Abused: No    Family History  Problem Relation Age of Onset   Heart disease Mother    Hypertension Mother    COPD Mother        was a smoker   Thyroid disease Mother    Hypertension Father    Hypertension Sister    Thyroid disease Sister    Breast cancer Sister    Hypertension Sister    Thyroid disease Sister    Bone cancer Sister    Breast cancer Maternal Aunt    Kidney disease Neg Hx    Bladder Cancer Neg Hx    Allergies  Allergen Reactions   Aspirin Hives and Other (See Comments)    Pt states that she is unable to take because she had bleeding in her brain   Other Other (See Comments)    Pt states that she is unable to take blood thinners because she had bleeding in her brain; Blood Thinners-per doctor at [facility] (Xarelto)   Penicillins Hives, Shortness Of Breath, Swelling and Other (See Comments)    Has patient had a PCN reaction causing immediate rash, facial/tongue/throat swelling, SOB or lightheadedness with hypotension: Yes Has patient had a PCN reaction causing severe rash involving mucus membranes or skin necrosis: No Has patient had a PCN reaction that required hospitalization No Has patient had a PCN reaction occurring within the last 10 years: No If all of the above answers are "NO", then may proceed with Cephalosporin use.   I? Current Facility-Administered Medications  Medication Dose Route Frequency Provider Last Rate Last Admin   acetaminophen (TYLENOL) tablet 650 mg  650 mg Oral Q6H PRN Ivor Costa, MD   650 mg at 06/26/21 0808   albuterol (PROVENTIL) (2.5 MG/3ML) 0.083% nebulizer solution 2.5 mg  2.5 mg Nebulization Q4H PRN Ivor Costa, MD       azithromycin (ZITHROMAX) 500 mg in sodium chloride 0.9 % 250 mL IVPB  500 mg Intravenous Q24H Tonye Royalty, NP   Stopped at 06/26/21 1000   cefTRIAXone (ROCEPHIN) 2 g in sodium chloride 0.9 % 100 mL IVPB  2 g Intravenous Q24H Ivor Costa, MD   Stopped at 06/26/21 0848   cholestyramine (QUESTRAN)  packet 4 g  4 g Oral Daily Ivor Costa, MD   4 g at 06/26/21 1102   dextromethorphan-guaiFENesin ([place] DM) 30-600 MG per 12 hr tablet 1 tablet  1 tablet Oral BID PRN Ivor Costa, MD       Difluprednate 0.05 % EMUL 1 drop  1 drop Both Eyes BID Ivor Costa, MD       DULoxetine (CYMBALTA) DR capsule 60 mg  60 mg Oral Daily Ivor Costa, MD   60 mg at 06/26/21 0856   gabapentin (NEURONTIN) capsule 300 mg  300 mg Oral BID Ivor Costa, MD   300 mg at 06/26/21 2219   ipratropium-albuterol (DUONEB) 0.5-2.5 (3) MG/3ML nebulizer solution 3 mL  3 mL Nebulization Q4H Ivor Costa, MD   3 mL at 06/26/21 1921   meclizine (ANTIVERT) tablet 25 mg  25 mg Oral TID PRN Ivor Costa, MD       Menthol 5 % PTCH   Topical QID PRN Ivor Costa, MD       methylPREDNISolone sodium succinate (SOLU-MEDROL) 40 mg/mL injection 40 mg  40 mg Intravenous Q12H Ivor Costa, MD   40 mg at 06/26/21 1921   midodrine (PROAMATINE) tablet 10 mg  10 mg Oral TID WC Lang Snow, NP   10 mg at 06/26/21 1725   morphine 2 MG/ML injection 0.5 mg  0.5 mg Intravenous Q3H PRN Ivor Costa, MD       norepinephrine (LEVOPHED) 4mg  in 247mL (0.016 mg/mL) premix infusion  0-40 mcg/min Intravenous Continuous Alfred Levins, [place], MD   Stopped at 06/26/21 1333   ondansetron (ZOFRAN) injection 4 mg  4 mg Intravenous Q8H PRN Ivor Costa, MD       pantoprazole (PROTONIX) injection 40 mg  40 mg Intravenous Q12H Ivor Costa, MD   40 mg at 06/26/21 2219   rosuvastatin (CRESTOR) tablet 10 mg  10 mg Oral Daily Ivor Costa, MD   10 mg at 06/26/21 1102   sodium bicarbonate 150 mEq in sterile water 1,150 mL infusion   Intravenous Continuous Darel Hong D, NP 100 mL/hr at 06/26/21 2340 New Bag at 06/26/21 2340   Current Outpatient Medications  Medication Sig Dispense Refill   acetaminophen (TYLENOL) 325 MG tablet Take 325-650 mg by mouth every 6 (six) hours as needed for mild pain.     albuterol (ACCUNEB) 0.63 MG/3ML nebulizer solution Take 1 ampule by nebulization  every 6 (six) hours as needed for wheezing.     apixaban (ELIQUIS) 5 MG TABS tablet TAKE 1 TABLET BY MOUTH TWICE DAILY . APPOINTMENT REQUIRED FOR FUTURE REFILLS 60 tablet 5   cholestyramine (QUESTRAN) 4 GM/DOSE powder Take 4 g by mouth daily.     Difluprednate 0.05 % EMUL Place 1 drop into both eyes 2 (two) times daily.     digoxin (LANOXIN) 0.125 MG tablet Take 1 tablet (125 mcg total) by mouth daily. 90 tablet 3   DULoxetine (CYMBALTA) 30 MG capsule TAKE 1 CAPSULE BY MOUTH ONCE DAILY FOR 14 DAYS, THEN INCREASE TO 2 CAPSULES ONCE DAILY (Patient taking differently: Take 60 mg by mouth daily.) 60 capsule 0   gabapentin (NEURONTIN) 300 MG capsule Take 600 mg by mouth 3 (three) times daily.     KLOR-CON M20 20 MEQ tablet Take 1 tablet by mouth once daily 90 tablet 3   Lancets MISC Use with True Metrix glucometer to check sugars BID 200  each 3   macitentan (OPSUMIT) 10 MG tablet Take 1 tablet (10 mg total) by mouth daily. 30 tablet 11   meclizine (ANTIVERT) 25 MG tablet Take 1 tablet (25 mg total) by mouth 3 (three) times daily as needed for dizziness. 30 tablet 1   Menthol, Topical Analgesic, (BIOFREEZE EX) Apply 1 application topically 4 (four) times daily as needed (for pain).     rosuvastatin (CRESTOR) 10 MG tablet Take 1 tablet by mouth once daily 90 tablet 0   Selexipag (UPTRAVI) 1600 MCG TABS Take 1 tablet (1,600 mcg total) by mouth 2 (two) times daily. 60 tablet 11   sildenafil (REVATIO) 20 MG tablet Take 1 tablet (20 mg total) by mouth 3 (three) times daily. 270 tablet 3   spironolactone (ALDACTONE) 25 MG tablet TAKE 1 TABLET BY MOUTH AT BEDTIME.  PLEASE CALL FOR OFFICE VISIT [phone] 90 tablet 3   torsemide (DEMADEX) 20 MG tablet Take 40 mg by mouth 2 (two) times daily.     TRUE METRIX BLOOD GLUCOSE TEST test strip CHECK BLOOD GLUCOSE TWICE DAILY 200 strip 3   gabapentin (NEURONTIN) 300 MG capsule Take 1 capsule (300 mg total) by mouth every morning AND 1 capsule (300 mg total) daily  after lunch AND 2 capsules (600 mg total) at bedtime. (Patient not taking: Reported on 06/25/2021) 360 capsule 1   ondansetron (ZOFRAN) 8 MG tablet Take 1 tablet (8 mg total) by mouth every 8 (eight) hours as needed for nausea or vomiting. (Patient not taking: Reported on 06/25/2021) 30 tablet 2     Abtx:  Anti-infectives (From admission, onward)    Start     Dose/Rate Route Frequency Ordered Stop   06/26/21 0600  ceFEPIme (MAXIPIME) 2 g in sodium chloride 0.9 % 100 mL IVPB  Status:  Discontinued        2 g 200 mL/hr over 30 Minutes Intravenous Every 24 hours 06/25/21 0958 06/25/21 1500   06/26/21 0600  cefTRIAXone (ROCEPHIN) 2 g in sodium chloride 0.9 % 100 mL IVPB        2 g 200 mL/hr over 30 Minutes Intravenous Every 24 hours 06/25/21 1500     06/26/21 0600  azithromycin (ZITHROMAX) 500 mg in sodium chloride 0.9 % 250 mL IVPB        500 mg 250 mL/hr over 60 Minutes Intravenous Every 24 hours 06/25/21 1831 06/28/21 0559   06/26/21 0000  vancomycin variable dose per unstable renal function (pharmacist dosing)  Status:  Discontinued         Does not apply See admin instructions 06/25/21 0900 06/25/21 1500   06/25/21 1030  ceFEPIme (MAXIPIME) 1 g in sodium chloride 0.9 % 100 mL IVPB        1 g 200 mL/hr over 30 Minutes Intravenous  Once 06/25/21 0900 06/25/21 1345   06/25/21 0915  vancomycin (VANCOREADY) IVPB 750 mg/150 mL        750 mg 150 mL/hr over 60 Minutes Intravenous  Once 06/25/21 0900 06/25/21 1100   06/25/21 0545  azithromycin (ZITHROMAX) 500 mg in sodium chloride 0.9 % 250 mL IVPB        500 mg 250 mL/hr over 60 Minutes Intravenous  Once 06/25/21 0538 06/25/21 0748   06/25/21 0500  ceFEPIme (MAXIPIME) 1 g in sodium chloride 0.9 % 100 mL IVPB        1 g 200 mL/hr over 30 Minutes Intravenous  Once 06/25/21 0449 06/25/21 0601   06/25/21 0500  vancomycin (VANCOCIN) IVPB  1000 mg/200 mL premix        1,000 mg 200 mL/hr over 60 Minutes Intravenous  Once 06/25/21 0449 06/25/21 0702        REVIEW OF SYSTEMS:  Const:  fever,  chills, negative weight loss Eyes: negative diplopia or visual changes, negative eye pain ENT: negative coryza, negative sore throat Resp: ++ cough, ++ dyspnea Cards:has rt sided chest pain, palpitations, lower extremity edema GU:  frequency, dysuria  GI: rt upper quadrant  abdominal pain, diarrhea, black stool  Skin: negative for rash and pruritus Heme: negative for easy bruising and gum/nose bleeding MS: general weakness Neurolo:dizziness Psych: negative for feelings of anxiety, depression  Endocrine: , diabetes Allergy/Immunology- as above: Objective:  VITALS:  BP (!) 132/57 (BP Location: Left Arm)    Pulse 70    Temp 98 F (36.7 C) (Axillary)    Resp 19    Wt 87.1 kg    SpO2 100%    BMI 30.07 kg/m  PHYSICAL EXAM:  General: Awake , cooperative, looks ill Head: Normocephalic, without obvious abnormality, atraumatic. Eyes: Conjunctivae clear, anicteric sclerae. Pupils are equal ENT Nares normal. No drainage or sinus tenderness. Tongue coated Neck: symmetrical, no adenopathy, thyroid: non tender no carotid bruit and no JVD.  Lungs: b/l air entry- crepts bases. Heart: irregular Abdomen: Soft, non-tender,not distended. Bowel sounds normal. No masses Extremities: left leg edematous  Skin: No rashes or lesions. Or bruising Lymph: Cervical, supraclavicular normal. Neurologic: Grossly non-focal Pertinent Labs Lab Results CBC    Component Value Date/Time   WBC 14.3 (H) 06/26/2021 0847   RBC 3.06 (L) 06/26/2021 0847   HGB 8.2 (L) 06/26/2021 0847   HGB 10.5 (L) 04/02/2018 1548   HCT 25.8 (L) 06/26/2021 0847   HCT 33.4 (L) 04/02/2018 1548   PLT 93 (L) 06/26/2021 0847   PLT 220 04/02/2018 1548   MCV 84.3 06/26/2021 0847   MCV 85 04/02/2018 1548   MCV 85 09/12/2014 1753   MCH 26.8 06/26/2021 0847   MCHC 31.8 06/26/2021 0847   RDW 15.0 06/26/2021 0847   RDW 12.8 04/02/2018 1548   RDW 14.0 09/12/2014 1753   LYMPHSABS 1.7  04/29/2021 1405   LYMPHSABS 1.8 04/02/2018 1548   LYMPHSABS 1.8 12/28/2013 1300   MONOABS 0.4 04/29/2021 1405   MONOABS 0.4 12/28/2013 1300   EOSABS 0.2 04/29/2021 1405   EOSABS 0.2 04/02/2018 1548   EOSABS 0.2 12/28/2013 1300   BASOSABS 0.0 04/29/2021 1405   BASOSABS 0.1 04/02/2018 1548   BASOSABS 0.1 12/28/2013 1300    CMP Latest Ref Rng & Units 06/26/2021 06/25/2021 06/25/2021  Glucose 70 - 99 mg/dL 224(H) 201(H) 171(H)  BUN 8 - 23 mg/dL 96(H) 83(H) 74(H)  Creatinine 0.44 - 1.00 mg/dL 3.02(H) 3.21(H) 3.11(H)  Sodium 135 - 145 mmol/L 124(L) 124(L) 126(L)  Potassium 3.5 - 5.1 mmol/L 5.0 5.1 4.9  Chloride 98 - 111 mmol/L 95(L) 98 98  CO2 22 - 32 mmol/L 18(L) 15(L) 20(L)  Calcium 8.9 - 10.3 mg/dL 7.7(L) 8.0(L) 7.9(L)  Total Protein 6.5 - 8.1 g/dL 6.6 - 6.2(L)  Total Bilirubin 0.3 - 1.2 mg/dL 1.9(H) - 3.0(H)  Alkaline Phos 38 - 126 U/L 137(H) - 126  AST 15 - 41 U/L 16 - 21  ALT 0 - 44 U/L 11 - 13      Microbiology: Recent Results (from the past 240 hour(s))  Blood culture (routine x 2)     Status: None (Preliminary result)   Collection Time: 06/25/21  2:33 AM  Specimen: BLOOD RIGHT FOREARM  Result Value Ref Range Status   Specimen Description   Final    BLOOD RIGHT FOREARM Performed at [facility], [address] [place], [place] [address]    Special Requests   Final    BOTTLES DRAWN AEROBIC AND ANAEROBIC Blood Culture results may not be optimal due to an inadequate volume of blood received in culture bottles Performed at [facility], [address] [place], [place] [address]    Culture  Setup Time   Final    GRAM POSITIVE COCCI IN BOTH AEROBIC AND ANAEROBIC BOTTLES Organism ID to follow CRITICAL RESULT CALLED TO, READ BACK BY AND VERIFIED WITHArdeen Garland [id number] 06/25/21 HNM Performed at [facility], [name], [place], [place] [address]    Culture   Final    Lonell Grandchild POSITIVE COCCI TOO YOUNG TO READ Performed at [facility], [address], [place], [place] [address]    Report Status PENDING  Incomplete  Blood Culture ID Panel (Reflexed)     Status: Abnormal   Collection Time: 06/25/21  2:33 AM  Result Value Ref Range Status   Enterococcus faecalis NOT DETECTED NOT DETECTED Final   Enterococcus Faecium NOT DETECTED NOT DETECTED Final   Listeria monocytogenes NOT DETECTED NOT DETECTED Final   Staphylococcus species NOT DETECTED NOT DETECTED Final   Staphylococcus aureus (BCID) NOT DETECTED NOT DETECTED Final   Staphylococcus epidermidis NOT DETECTED NOT DETECTED Final   Staphylococcus lugdunensis NOT DETECTED NOT DETECTED Final   Streptococcus species DETECTED (A) NOT DETECTED Final    Comment: Not Enterococcus species, Streptococcus agalactiae, Streptococcus pyogenes, or Streptococcus pneumoniae. CRITICAL RESULT CALLED TO, READ BACK BY AND VERIFIED WITH: [place] [id number] 06/25/21 HNM    Streptococcus agalactiae NOT DETECTED NOT DETECTED Final   Streptococcus pneumoniae NOT DETECTED NOT DETECTED Final   Streptococcus pyogenes NOT DETECTED NOT DETECTED Final   A.calcoaceticus-baumannii NOT DETECTED NOT DETECTED Final   Bacteroides fragilis NOT DETECTED NOT DETECTED Final   Enterobacterales NOT DETECTED NOT DETECTED Final   Enterobacter cloacae complex NOT DETECTED NOT DETECTED Final   Escherichia coli NOT DETECTED NOT DETECTED Final   Klebsiella aerogenes NOT DETECTED NOT DETECTED Final   Klebsiella oxytoca NOT DETECTED NOT DETECTED Final   Klebsiella pneumoniae NOT DETECTED NOT DETECTED Final   Proteus species NOT DETECTED NOT DETECTED Final   Salmonella species NOT DETECTED NOT DETECTED Final   Serratia marcescens NOT DETECTED NOT DETECTED Final   Haemophilus influenzae NOT DETECTED NOT DETECTED Final   Neisseria meningitidis NOT DETECTED NOT DETECTED Final   Pseudomonas aeruginosa NOT DETECTED NOT DETECTED Final   Stenotrophomonas maltophilia NOT DETECTED NOT DETECTED Final   Candida  albicans NOT DETECTED NOT DETECTED Final   Candida auris NOT DETECTED NOT DETECTED Final   Candida glabrata NOT DETECTED NOT DETECTED Final   Candida krusei NOT DETECTED NOT DETECTED Final   Candida parapsilosis NOT DETECTED NOT DETECTED Final   Candida tropicalis NOT DETECTED NOT DETECTED Final   Cryptococcus neoformans/gattii NOT DETECTED NOT DETECTED Final    Comment: Performed at [facility], [address] [place], [place] [address]  Resp Panel by RT-PCR (Flu A&B, Covid) Nasopharyngeal Swab     Status: None   Collection Time: 06/25/21  3:33 AM   Specimen: Nasopharyngeal Swab; Nasopharyngeal(NP) swabs in vial transport medium  Result Value Ref Range Status   SARS Coronavirus 2 by RT PCR NEGATIVE NEGATIVE Final  Comment: (NOTE) SARS-CoV-2 target nucleic acids are NOT DETECTED.  The SARS-CoV-2 RNA is generally detectable in upper respiratory specimens during the acute phase of infection. The lowest concentration of SARS-CoV-2 viral copies this assay can detect is 138 copies/mL. A negative result does not preclude SARS-Cov-2 infection and should not be used as the sole basis for treatment or other patient management decisions. A negative result may occur with  improper specimen collection/handling, submission of specimen other than nasopharyngeal swab, presence of viral mutation(s) within the areas targeted by this assay, and inadequate number of viral copies(<138 copies/mL). A negative result must be combined with clinical observations, patient history, and epidemiological information. The expected result is Negative.  Fact Sheet for Patients:  EntrepreneurPulse.com.au  Fact Sheet for Healthcare Providers:  IncredibleEmployment.be  This test is no t yet approved or cleared by the [place] FDA and  has been authorized for detection and/or diagnosis of SARS-CoV-2 by FDA under an Emergency Use Authorization (EUA). This EUA will  remain  in effect (meaning this test can be used) for the duration of the COVID-19 declaration under Section 564(b)(1) of the Act, 21 U.S.C.section 360bbb-3(b)(1), unless the authorization is terminated  or revoked sooner.       Influenza A by PCR NEGATIVE NEGATIVE Final   Influenza B by PCR NEGATIVE NEGATIVE Final    Comment: (NOTE) The Xpert Xpress SARS-CoV-2/FLU/RSV plus assay is intended as an aid in the diagnosis of influenza from Nasopharyngeal swab specimens and should not be used as a sole basis for treatment. Nasal washings and aspirates are unacceptable for Xpert Xpress SARS-CoV-2/FLU/RSV testing.  Fact Sheet for Patients: EntrepreneurPulse.com.au  Fact Sheet for Healthcare Providers: IncredibleEmployment.be  This test is not yet approved or cleared by the [place] FDA and has been authorized for detection and/or diagnosis of SARS-CoV-2 by FDA under an Emergency Use Authorization (EUA). This EUA will remain in effect (meaning this test can be used) for the duration of the COVID-19 declaration under Section 564(b)(1) of the Act, 21 U.S.C. section 360bbb-3(b)(1), unless the authorization is terminated or revoked.  Performed at [facility], [address] [place], [place] [address]   Blood culture (routine x 2)     Status: None (Preliminary result)   Collection Time: 06/25/21  5:55 AM   Specimen: BLOOD  Result Value Ref Range Status   Specimen Description   Final    BLOOD LEFT ARM Performed at [facility], [address], [place], [place] [address]    Special Requests   Final    BOTTLES DRAWN AEROBIC AND ANAEROBIC Blood Culture adequate volume Performed at [facility], [address], [place], [place] [address]    Culture  Setup Time   Final    GRAM POSITIVE COCCI IN BOTH AEROBIC AND ANAEROBIC BOTTLES CRITICAL VALUE NOTED.  VALUE IS CONSISTENT WITH PREVIOUSLY REPORTED AND CALLED  VALUE. Performed at [facility], [address] [place], [place] [address]    Culture   Final    Lonell Grandchild POSITIVE COCCI TOO YOUNG TO READ Performed at [facility], [place] [address] [place], [place] [address]    Report Status PENDING  Incomplete  Urine Culture     Status: Abnormal (Preliminary result)   Collection Time: 06/25/21  6:55 AM   Specimen: Urine, Clean Catch  Result Value Ref Range Status   Specimen Description   Final    URINE, CLEAN CATCH Performed at [facility], [place], [place]  [address]    Special Requests   Final    NONE Performed at [facility], [address] [place], [place] [address]    Culture (A)  Final    >=100,000 COLONIES/mL KLEBSIELLA PNEUMONIAE SUSCEPTIBILITIES TO FOLLOW Performed at [facility], [place] [address] [place], [place] [address]    Report Status PENDING  Incomplete  MRSA Next Gen by PCR, Nasal     Status: None   Collection Time: 06/25/21 10:55 PM   Specimen: Nasal Mucosa; Nasal Swab  Result Value Ref Range Status   MRSA by PCR Next Gen NOT DETECTED NOT DETECTED Final    Comment: (NOTE) [name] (FDA approved for NASAL specimens only), is one component of a comprehensive MRSA colonization surveillance program. It is not intended to diagnose MRSA infection nor to guide or monitor treatment for MRSA infections. Test performance is not FDA approved in patients less than 72 years old. Performed at [name], [address] [place], [place] [address]     IMAGING RESULTS:  I have personally reviewed the films ?Stable asymmetric elevation right hemidiaphragm. Diffuse interstitial opacity again noted with new areas of patchy airspace disease in the right upper lobe and medial right base. No substantial pleural effusion. Left-sided permanent pacemaker again Noted  Impression/Recommendation ? ?Streptococcus bacteremia with sepsis - source currently  unclear ID of the streptococcus pending- could be  GI pathogen Because of pacemaker she will need TEE if 2 d echo done today does not show any vegetation Vanco/cefepime changed to ceftriaxone  Acute on chronic resp failure- has ILD/COPD/OSA Azithromycin not needed now that we know she has strep bacteremia Could have chf VS pneumonia due to sterp  Afib on eliquis and digoxin  AKI on CKD  Anemia'  Thrombocytopenia -infection   Hyponatremia ? ___________________________________________________ Discussed with patient, daughters  Note:  This document was prepared using Dragon voice recognition software and may include unintentional dictation errors.

## 2021-06-26 NOTE — ED Notes (Signed)
Dr Tera Helper and RT were just at bedside. Verbal order for LR bolus 538mL and to wean off levophed.

## 2021-06-26 NOTE — ED Notes (Signed)
Redid 12 lead EKG. EKG reads NSR with BBB. HR is irregular, mostly 60-70.

## 2021-06-26 NOTE — ED Notes (Signed)
Bipap removed (for sleeping), placed on baseline 4L per [place].

## 2021-06-26 NOTE — ED Notes (Signed)
ED pyxis loaded with insufficient quantity for 12pm midodrine dose. Pharmacy messaged to send for pt. Pt still receiving 91mcg/min of levophed for BP support.

## 2021-06-26 NOTE — ED Notes (Signed)
ED secretary will order hospital bed for pt.

## 2021-06-26 NOTE — Consult Note (Signed)
[facility] Consult Note: June 26, 2021    Date of Admission:  06/25/2021           Reason for Consult: Acute kidney injury    Referring Provider: Barb Merino, MD Primary Care Provider: [name], MD   History of Presenting Illness:  Debra Saunders is a 76 y.o. female  She presented to the emergency room for evaluation of shortness of breath, fever, chills, mucus production.  Patient also reports right upper quadrant pain which has been going on since about Thanksgiving.  She also reports chronic diarrhea for over a year managed with cholestyramine.  Patient reports that however, it does not help much.  Patient reports that over the last couple of weeks, she has not been eating very much.  She denies any frank blood in the stool or vomiting blood. Patient initially presented to the emergency room and was hypotensive in the 70s.  She was given IV fluid bolus presents with lactated Ringer.  She has underlying intonational lung disease and uses chronic oxygen at 4 L/min. In the ER, her creatinine was noted to be elevated therefore nephrology has been consulted for evaluation. Baseline creatinine of 1.04 from April 29, 2021. CT abdomen pelvis without contrast upon admission showed kidneys are normal without hydronephrosis or renal calculi.  Bladder is unremarkable.  Review of Systems: ROS Gen: Had fevers and chills at home HEENT: No vision or hearing problems CV: Had shortness of breath upon presentation Resp: + cough , sputum production GI: No nausea, vomiting.  Reports chronic diarrhea.  No blood in the stool GU : No problems with voiding.  No hematuria.   MS: Ambulatory with a walker at home.  Chronic osteoarthritis pain in the knees Derm:   No complaints Psych: No complaints Heme: No complaints Neuro: No complaints Endocrine: No complaints   Past Medical History:  Diagnosis Date   Arthritis    Atrial fibrillation, persistent ([place])    a.  s/p TEE-guided DCCV on 08/20/2017   Cardiorenal syndrome    a. 09/2017 Creat rose to 2.7 w/ diuresis-->HD x 2.   Chest pain    a. 01/2012 Cath: nonobs dzs;  c. 04/2014 MV: No ischemia.   Chronic back pain    Chronic combined systolic (congestive) and diastolic (congestive) heart failure ([place])    a. Dx 2005 @ [facility];  b. 02/2014 Echo: EF 55-60%; c. 07/2017: EF 30-35%, diffuse HK, trivial AI, severe MR, moderate TR; d. 09/2017 Echo: EF 40-45%; e. 02/2018 Echo: EF 55-60%, Gr1 DD; f. 08/2019 Echo: EF 60-65%, Gr2 DD, Nl RV fxn, RVSP 49.83mmHg. Mod-Sev MR. Mild to mod TR. Mod PR. RA ~ 72mmHg.   Chronic respiratory failure (HCC)    a. on home O2   Gastritis    History of intracranial hemorrhage    a. 6/14 described by neurosurgery as "small frontal posttraumatic SAH " - pt was on xarelto @ the time.   Hyperlipidemia    Hypertension    Interstitial lung disease (HCC)    Lipoma of back    Lymphedema    a. Chronic LLE edema.   NICM (nonischemic cardiomyopathy) ([place])    a. 09/2017 Echo: EF 40-45%; b. 09/2017 Cath: minor, insignificant CAD; c. 02/2018 Echo: EF 55-60%, Gr1 DD; d. 08/2019 Echo: EF 60-65%, Gr2 DD.   Obesity    PAH (pulmonary artery hypertension) ([place])    a. 09/2017 Echo: PASP 74mmHg; b. 09/2017 RHC: RA 23, RV 84/13, PA 84/29, PCWP 20,  CO 3.89, CI 1.89. LVEDP 18.   Presence of permanent cardiac pacemaker 01/09/2014   Sepsis with metabolic encephalopathy ([place])    Sleep apnea    Streptococcal bacteremia    Stroke ([place]) 2014   Subarachnoid hemorrhage ([place]) 01/19/2013   Symptomatic bradycardia    a. s/p BSX PPM.   Thyroid nodule    Type II diabetes mellitus ([place])    Urinary incontinence     Social History   Tobacco Use   Smoking status: Former    Packs/day: 0.30    Years: 2.00    Pack years: 0.60    Types: Cigarettes    Quit date: 06/23/1990    Years since quitting: 31.0   Smokeless tobacco: Never  Vaping Use   Vaping Use: Never used  Substance Use Topics   Alcohol use: No   Drug use: No     Family History  Problem Relation Age of Onset   Heart disease Mother    Hypertension Mother    COPD Mother        was a smoker   Thyroid disease Mother    Hypertension Father    Hypertension Sister    Thyroid disease Sister    Breast cancer Sister    Hypertension Sister    Thyroid disease Sister    Bone cancer Sister    Breast cancer Maternal Aunt    Kidney disease Neg Hx    Bladder Cancer Neg Hx      OBJECTIVE: Blood pressure 117/61, pulse 70, temperature 98 F (36.7 C), temperature source Axillary, resp. rate 20, weight 87.1 kg, SpO2 96 %.  Physical Exam Physical Exam: General:  No acute distress, laying in the bed  HEENT  anicteric, moist oral mucous membrane  Pulm/lungs  Mild basilar crackles, O2 by [place]  CVS/Heart  irregular rhythm, no rub or gallop, systolic murmur present  Abdomen:   Soft, nontender  Extremities:  + Chronic left leg edema, trace on right  Neurologic:  Alert, oriented, able to follow commands  Skin:  No acute rashes     Lab Results Lab Results  Component Value Date   WBC 22.8 (H) 06/25/2021   HGB 8.2 (L) 06/25/2021   HCT 25.3 (L) 06/25/2021   MCV 85.5 06/25/2021   PLT 87 (L) 06/25/2021    Lab Results  Component Value Date   CREATININE 3.21 (H) 06/25/2021   BUN 83 (H) 06/25/2021   NA 124 (L) 06/25/2021   K 5.1 06/25/2021   CL 98 06/25/2021   CO2 15 (L) 06/25/2021    Lab Results  Component Value Date   ALT 13 06/25/2021   AST 21 06/25/2021   ALKPHOS 126 06/25/2021   BILITOT 3.0 (H) 06/25/2021     Microbiology: Recent Results (from the past 240 hour(s))  Blood culture (routine x 2)     Status: None (Preliminary result)   Collection Time: 06/25/21  2:33 AM   Specimen: BLOOD RIGHT FOREARM  Result Value Ref Range Status   Specimen Description BLOOD RIGHT FOREARM  Final   Special Requests   Final    BOTTLES DRAWN AEROBIC AND ANAEROBIC Blood Culture results may not be optimal due to an inadequate volume of blood received in  culture bottles   Culture  Setup Time   Final    GRAM POSITIVE COCCI IN BOTH AEROBIC AND ANAEROBIC BOTTLES Organism ID to follow CRITICAL RESULT CALLED TO, READ BACK BY AND VERIFIED WITHArdeen Garland PHARMD [id number] 06/25/21 HNM Performed at [facility], [address] [place], [place] [address]    Culture GRAM POSITIVE COCCI  Final   Report Status PENDING  Incomplete  Blood Culture ID Panel (Reflexed)     Status: Abnormal   Collection Time: 06/25/21  2:33 AM  Result Value Ref Range Status   Enterococcus faecalis NOT DETECTED NOT DETECTED Final   Enterococcus Faecium NOT DETECTED NOT DETECTED Final   Listeria monocytogenes NOT DETECTED NOT DETECTED Final   Staphylococcus species NOT DETECTED NOT DETECTED Final   Staphylococcus aureus (BCID) NOT DETECTED NOT DETECTED Final   Staphylococcus epidermidis NOT DETECTED NOT DETECTED Final   Staphylococcus lugdunensis NOT DETECTED NOT DETECTED Final   Streptococcus species DETECTED (A) NOT DETECTED Final    Comment: Not Enterococcus species, Streptococcus agalactiae, Streptococcus pyogenes, or Streptococcus pneumoniae. CRITICAL RESULT CALLED TO, READ BACK BY AND VERIFIED WITH: [place] TZGYFV [id number] 06/25/21 HNM    Streptococcus agalactiae NOT DETECTED NOT DETECTED Final   Streptococcus pneumoniae NOT DETECTED NOT DETECTED Final   Streptococcus pyogenes NOT DETECTED NOT DETECTED Final   A.calcoaceticus-baumannii NOT DETECTED NOT DETECTED Final   Bacteroides fragilis NOT DETECTED NOT DETECTED Final   Enterobacterales NOT DETECTED NOT DETECTED Final   Enterobacter cloacae complex NOT DETECTED NOT DETECTED Final   Escherichia coli NOT DETECTED NOT DETECTED Final   Klebsiella aerogenes NOT DETECTED NOT DETECTED Final   Klebsiella oxytoca NOT DETECTED NOT DETECTED Final   Klebsiella pneumoniae NOT DETECTED NOT DETECTED Final   Proteus species NOT DETECTED NOT DETECTED Final   Salmonella species NOT DETECTED NOT DETECTED Final    Serratia marcescens NOT DETECTED NOT DETECTED Final   Haemophilus influenzae NOT DETECTED NOT DETECTED Final   Neisseria meningitidis NOT DETECTED NOT DETECTED Final   Pseudomonas aeruginosa NOT DETECTED NOT DETECTED Final   Stenotrophomonas maltophilia NOT DETECTED NOT DETECTED Final   Candida albicans NOT DETECTED NOT DETECTED Final   Candida auris NOT DETECTED NOT DETECTED Final   Candida glabrata NOT DETECTED NOT DETECTED Final   Candida krusei NOT DETECTED NOT DETECTED Final   Candida parapsilosis NOT DETECTED NOT DETECTED Final   Candida tropicalis NOT DETECTED NOT DETECTED Final   Cryptococcus neoformans/gattii NOT DETECTED NOT DETECTED Final    Comment: Performed at [facility], [address] [place], [place] [address]  Resp Panel by RT-PCR (Flu A&B, Covid) Nasopharyngeal Swab     Status: None   Collection Time: 06/25/21  3:33 AM   Specimen: Nasopharyngeal Swab; Nasopharyngeal(NP) swabs in vial transport medium  Result Value Ref Range Status   SARS Coronavirus 2 by RT PCR NEGATIVE NEGATIVE Final    Comment: (NOTE) SARS-CoV-2 target nucleic acids are NOT DETECTED.  The SARS-CoV-2 RNA is generally detectable in upper respiratory specimens during the acute phase of infection. The lowest concentration of SARS-CoV-2 viral copies this assay can detect is 138 copies/mL. A negative result does not preclude SARS-Cov-2 infection and should not be used as the sole basis for treatment or other patient management decisions. A negative result may occur with  improper specimen collection/handling, submission of specimen other than nasopharyngeal swab, presence of viral mutation(s) within the areas targeted by this assay, and inadequate number of viral copies(<138 copies/mL). A negative result must be combined with clinical observations, patient history, and epidemiological information. The expected result is Negative.  Fact Sheet for Patients:   EntrepreneurPulse.com.au  Fact Sheet for Healthcare Providers:  IncredibleEmployment.be  This test is no t yet approved or cleared by the [place] FDA  and  has been authorized for detection and/or diagnosis of SARS-CoV-2 by FDA under an Emergency Use Authorization (EUA). This EUA will remain  in effect (meaning this test can be used) for the duration of the COVID-19 declaration under Section 564(b)(1) of the Act, 21 U.S.C.section 360bbb-3(b)(1), unless the authorization is terminated  or revoked sooner.       Influenza A by PCR NEGATIVE NEGATIVE Final   Influenza B by PCR NEGATIVE NEGATIVE Final    Comment: (NOTE) The Xpert Xpress SARS-CoV-2/FLU/RSV plus assay is intended as an aid in the diagnosis of influenza from Nasopharyngeal swab specimens and should not be used as a sole basis for treatment. Nasal washings and aspirates are unacceptable for Xpert Xpress SARS-CoV-2/FLU/RSV testing.  Fact Sheet for Patients: EntrepreneurPulse.com.au  Fact Sheet for Healthcare Providers: IncredibleEmployment.be  This test is not yet approved or cleared by the [place] FDA and has been authorized for detection and/or diagnosis of SARS-CoV-2 by FDA under an Emergency Use Authorization (EUA). This EUA will remain in effect (meaning this test can be used) for the duration of the COVID-19 declaration under Section 564(b)(1) of the Act, 21 U.S.C. section 360bbb-3(b)(1), unless the authorization is terminated or revoked.  Performed at [facility], [address] [place], [place] [address]   Blood culture (routine x 2)     Status: None (Preliminary result)   Collection Time: 06/25/21  5:55 AM   Specimen: BLOOD  Result Value Ref Range Status   Specimen Description BLOOD LEFT ARM  Final   Special Requests   Final    BOTTLES DRAWN AEROBIC AND ANAEROBIC Blood Culture adequate volume   Culture  Setup  Time   Final    GRAM POSITIVE COCCI IN BOTH AEROBIC AND ANAEROBIC BOTTLES CRITICAL VALUE NOTED.  VALUE IS CONSISTENT WITH PREVIOUSLY REPORTED AND CALLED VALUE. Performed at [facility], [address] [place], [place] [address]    Culture [facility] POSITIVE COCCI  Final   Report Status PENDING  Incomplete  Urine Culture     Status: Abnormal (Preliminary result)   Collection Time: 06/25/21  6:55 AM   Specimen: Urine, Clean Catch  Result Value Ref Range Status   Specimen Description   Final    URINE, CLEAN CATCH Performed at [facility], [address], [place], [place] [address]    Special Requests   Final    NONE Performed at [facility], [address], [place], [place] [address]    Culture (A)  Final    >=100,000 COLONIES/mL GRAM NEGATIVE RODS IDENTIFICATION AND SUSCEPTIBILITIES TO FOLLOW Performed at [facility], [place] [address] [place], [place] [address]    Report Status PENDING  Incomplete  MRSA Next Gen by PCR, Nasal     Status: None   Collection Time: 06/25/21 10:55 PM   Specimen: Nasal Mucosa; Nasal Swab  Result Value Ref Range Status   MRSA by PCR Next Gen NOT DETECTED NOT DETECTED Final    Comment: (NOTE) [name] (FDA approved for NASAL specimens only), is one component of a comprehensive MRSA colonization surveillance program. It is not intended to diagnose MRSA infection nor to guide or monitor treatment for MRSA infections. Test performance is not FDA approved in patients less than 78 years old. Performed at [facility], [address] [place],  [address]     Medications: Scheduled Meds:  cholestyramine  4 g Oral Daily   Difluprednate  1 drop Both Eyes BID  DULoxetine  60 mg Oral Daily   gabapentin  300 mg Oral BID   ipratropium-albuterol  3 mL Nebulization Q4H   methylPREDNISolone (SOLU-MEDROL) injection  40 mg Intravenous Q12H   midodrine       midodrine  10 mg Oral TID WC    pantoprazole (PROTONIX) IV  40 mg Intravenous Q12H   rosuvastatin  10 mg Oral Daily   Continuous Infusions:  sodium chloride 100 mL/hr at 06/26/21 0850   azithromycin 500 mg (06/26/21 0855)   cefTRIAXone (ROCEPHIN)  IV Stopped (06/26/21 0848)   norepinephrine (LEVOPHED) Adult infusion 3 mcg/min (06/26/21 0857)   PRN Meds:.acetaminophen, albuterol, dextromethorphan-guaiFENesin, meclizine, Menthol, morphine injection, ondansetron (ZOFRAN) IV  Allergies  Allergen Reactions   Aspirin Hives and Other (See Comments)    Pt states that she is unable to take because she had bleeding in her brain   Other Other (See Comments)    Pt states that she is unable to take blood thinners because she had bleeding in her brain; Blood Thinners-per doctor at [facility] (Xarelto)   Penicillins Hives, Shortness Of Breath, Swelling and Other (See Comments)    Has patient had a PCN reaction causing immediate rash, facial/tongue/throat swelling, SOB or lightheadedness with hypotension: Yes Has patient had a PCN reaction causing severe rash involving mucus membranes or skin necrosis: No Has patient had a PCN reaction that required hospitalization No Has patient had a PCN reaction occurring within the last 10 years: No If all of the above answers are "NO", then may proceed with Cephalosporin use.    Urinalysis: Recent Labs    06/25/21 0610  COLORURINE AMBER*  LABSPEC 1.016  PHURINE 5.0  GLUCOSEU NEGATIVE  HGBUR NEGATIVE  BILIRUBINUR NEGATIVE  KETONESUR NEGATIVE  PROTEINUR 30*  NITRITE NEGATIVE  LEUKOCYTESUR SMALL*      Imaging: CT ABDOMEN PELVIS WO CONTRAST  Result Date: 06/24/2021 CLINICAL DATA:  Abdominal pain, nonlocalized. Diarrhea loss of appetite for past few days. EXAM: CT ABDOMEN AND PELVIS WITHOUT CONTRAST TECHNIQUE: Multidetector CT imaging of the abdomen and pelvis was performed following the standard protocol without IV contrast. COMPARISON:  CT examination dated October 15, 2020 FINDINGS:  Lower chest: No acute abnormality. Hepatobiliary: No focal liver abnormality is seen. Status post cholecystectomy. No biliary dilatation. Pancreas: Unremarkable. No pancreatic ductal dilatation or surrounding inflammatory changes. Spleen: Normal in size without focal abnormality. Adrenals/Urinary Tract: Adrenal glands are unremarkable. Kidneys are normal, without renal calculi, focal lesion, or hydronephrosis. Bladder is unremarkable. Stomach/Bowel: Stomach is within normal limits. Appendix not visualized. No evidence of bowel wall thickening, distention, or inflammatory changes. Colonic diverticulosis without evidence of acute diverticulitis. Vascular/Lymphatic: Aortic atherosclerosis. No enlarged abdominal or pelvic lymph nodes. IVC filter is noted. Reproductive: Status post hysterectomy. Stable hypodense cystic structures in bilateral adnexal region. Other: No abdominal wall hernia or abnormality. No abdominopelvic ascites. Musculoskeletal: Degenerate disc disease of the lumbar spine with mild levoscoliosis. No acute osseous abnormality. IMPRESSION: 1. Sigmoid colonic diverticulosis without evidence of acute diverticulitis. 2. Bowel loops are normal in caliber without evidence of obstruction or significant wall thickening. 3. Status post cholecystectomy and hysterectomy. 4. 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 IVC filter is present. Electronically Signed   By: Keane Police D.O.   On: 06/24/2021 16:17   DG Chest Portable 1 View  Result Date: 06/25/2021 CLINICAL DATA:  Chest and back pain with diarrhea. EXAM: PORTABLE CHEST 1 VIEW COMPARISON:  01/15/2021 FINDINGS: 0459 hours. Stable asymmetric elevation right hemidiaphragm. Diffuse interstitial opacity  again noted with new areas of patchy airspace disease in the right upper lobe and medial right base. No substantial pleural effusion. Left-sided permanent pacemaker again noted. Telemetry leads overlie the chest. IMPRESSION: Interval  development of patchy airspace disease in the right upper lobe and medial right base. Multifocal pneumonia concern. Electronically Signed   By: Misty Stanley M.D.   On: 06/25/2021 05:23      Assessment/Plan:  TEJA COSTEN is a 76 y.o. female with medical problems of hypertension, hyperlipidemia, diabetes, depression, subdural hematoma, sinoatrial node dysfunction with history of pacemaker placement, history of obstructive sleep apnea, pulmonary arterial hypertension, interstitial lung disease on chronic 4 L oxygen, diastolic CHF, atrial fibrillation, anemia, history of DVT  was admitted on 06/25/2021 for :  Sepsis due to pneumonia ([place]) [J18.9, A41.9] Multifocal pneumonia [J18.9]  1.  Acute renal failure 2.  Chronic kidney disease stage IIIa.  Creatinine 1.04/GFR 55 from April 29, 2021 3.  Sepsis-Streptococcus bacteremia and Klebsiella pneumoniae in urine, multifocal pneumonia 4.  Chronic left leg edema- 5.  Hyponatremia 6.  Interstitial lung disease, pulmonary arterial hypertension, baseline requirement of O2 by [place] 4 L/min  Urinalysis June 25, 2021: Small leukocytes, 30 mg protein, many bacteria, 6-10 WBCs, 0-5 RBCs. Urine culture greater than 100,000 Klebsiella pneumoniae Imaging: CT abdomen noncontrast June 24, 2021:Atherosclerotic disease of abdominal aorta, IVC filter present, normal kidneys and bladder.  AKI likely secondary to concurrent sepsis and hypotension. Hyponatremia likely secondary to AKI  Recommendations: Supportive care-avoid hypotension Avoid nephrotoxic agents including IV contrast, nonsteroidals, aminoglycosides Agree with volume resuscitation Agree with IV bicarbonate supplementation No acute indication for dialysis at present.  Will monitor closely.  Exavier Lina Candiss Norse 06/26/21

## 2021-06-27 DIAGNOSIS — B955 Unspecified streptococcus as the cause of diseases classified elsewhere: Secondary | ICD-10-CM | POA: Diagnosis not present

## 2021-06-27 DIAGNOSIS — R7881 Bacteremia: Secondary | ICD-10-CM | POA: Diagnosis not present

## 2021-06-27 DIAGNOSIS — A419 Sepsis, unspecified organism: Secondary | ICD-10-CM | POA: Diagnosis not present

## 2021-06-27 DIAGNOSIS — R6521 Severe sepsis with septic shock: Secondary | ICD-10-CM | POA: Diagnosis not present

## 2021-06-27 LAB — URINE CULTURE: Culture: >=100000 — AB

## 2021-06-27 LAB — ECHOCARDIOGRAM COMPLETE
AR max vel: 1.71 cm2
AV Area VTI: 1.73 cm2
AV Area mean vel: 1.72 cm2
AV Mean grad: 10.5 mmHg
AV Peak grad: 18.4 mmHg
Ao pk vel: 2.15 m/s
S' Lateral: 2.8 cm
Weight: 3072.33 oz

## 2021-06-27 LAB — C DIFFICILE QUICK SCREEN W PCR REFLEX
C Diff antigen: NEGATIVE
C Diff interpretation: NOT DETECTED
C Diff toxin: NEGATIVE

## 2021-06-27 LAB — BASIC METABOLIC PANEL
Anion gap: 10 (ref 5–15)
Anion gap: 9 (ref 5–15)
BUN: 90 mg/dL — ABNORMAL HIGH (ref 8–23)
BUN: 84 mg/dL — ABNORMAL HIGH (ref 8–23)
CO2: 24 mmol/L (ref 22–32)
CO2: 25 mmol/L (ref 22–32)
Calcium: 7.2 mg/dL — ABNORMAL LOW (ref 8.9–10.3)
Calcium: 7.3 mg/dL — ABNORMAL LOW (ref 8.9–10.3)
Chloride: 95 mmol/L — ABNORMAL LOW (ref 98–111)
Chloride: 97 mmol/L — ABNORMAL LOW (ref 98–111)
Creatinine, Ser: 2.05 mg/dL — ABNORMAL HIGH (ref 0.44–1.00)
Creatinine, Ser: 1.68 mg/dL — ABNORMAL HIGH (ref 0.44–1.00)
GFR, Estimated: 25 mL/min — ABNORMAL LOW (ref >60)
GFR, Estimated: 31 mL/min — ABNORMAL LOW (ref >60)
Glucose, Bld: 227 mg/dL — ABNORMAL HIGH (ref 70–99)
Glucose, Bld: 257 mg/dL — ABNORMAL HIGH (ref 70–99)
Potassium: 4.5 mmol/L (ref 3.5–5.1)
Potassium: 4.5 mmol/L (ref 3.5–5.1)
Sodium: 129 mmol/L — ABNORMAL LOW (ref 135–145)
Sodium: 131 mmol/L — ABNORMAL LOW (ref 135–145)

## 2021-06-27 LAB — CBG MONITORING, ED: Glucose-Capillary: 218 mg/dL — ABNORMAL HIGH (ref 70–99)

## 2021-06-27 LAB — PROCALCITONIN: Procalcitonin: 5.8 ng/mL

## 2021-06-27 MED ORDER — MIDODRINE HCL 5 MG PO TABS
10.0000 mg | ORAL_TABLET | Freq: Three times a day (TID) | ORAL | Status: DC
  Administered 2021-06-27 – 2021-06-29 (×6): 10 mg via ORAL
  Filled 2021-06-27 (×5): qty 2

## 2021-06-27 MED ORDER — ENSURE MAX PROTEIN PO LIQD
11.0000 [oz_av] | Freq: Every day | ORAL | Status: DC
  Administered 2021-06-29: 11 [oz_av] via ORAL
  Filled 2021-06-27: qty 330

## 2021-06-27 MED ORDER — METHYLPREDNISOLONE SODIUM SUCC 40 MG IJ SOLR
40.0000 mg | Freq: Every day | INTRAMUSCULAR | Status: DC
  Administered 2021-06-28 – 2021-06-29 (×2): 40 mg via INTRAVENOUS
  Filled 2021-06-27: qty 1

## 2021-06-27 NOTE — ED Notes (Signed)
Pt taken off bipap and place on 4 L [place] per RT. Pt wears 4 L [place] at home and cpap at night per pt.

## 2021-06-27 NOTE — ED Notes (Signed)
Pt changed into clean brief, and repositioned in bed.

## 2021-06-27 NOTE — Progress Notes (Signed)
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 fib on Eliquis, CKD stage IIIa, cardiorenal syndrome, DVT on Eliquis presented to the ER with shortness of breath, cough fever and chills, dysuria and right upper quadrant abdominal pain for almost 3 weeks. In the emergency room initially hypotensive with blood pressure 73/26 that improved to 103/2:54 liters Ringer lactate bolus.  She was 87% on 4 L home oxygen.  Started on BiPAP for respiratory distress.  WBC count 20,000.  Hemoglobin 8.1-7.  COVID-19 negative.  INR 2.  FOBT negative.  Lactic acid 1.  Abnormal urinalysis.  Sodium 128-124.  Temperature 100.1.  Chest x-ray with multifocal infiltrate.  CT scan abdomen pelvis negative for any acute intra-abdominal issues.  Blood cultures subsequently growing gram-positive cocci with group G Streptococcus.   Assessment & Plan:   Principal Problem:   Severe sepsis with septic shock (HCC) Active Problems:   Pulmonary hypertension, unspecified (HCC)   Acute on chronic respiratory failure with hypoxia (HCC)   Normocytic anemia   Interstitial lung disease (HCC)   UTI (urinary tract infection)   Atrial fibrillation (HCC)   Multifocal pneumonia   HTN (hypertension)   HLD (hyperlipidemia)   Chronic diastolic CHF (congestive heart failure) (HCC)   Acute renal failure superimposed on stage 3a chronic kidney disease (HCC)   DVT (deep venous thrombosis) (HCC)   Hyponatremia   Thrombocytopenia (HCC)   Diarrhea   Bacteremia due to Gram-positive bacteria   GI bleeding   Black stool  Severe sepsis with septic shock secondary to multifocal pneumonia.  UTI and  bacteremia due to gram-positive bacteria: Streptococcal bacteremia. Blood pressures stabilized.  Renal functions improving.  Discontinue Levophed.  Taper off of steroids. Initially on vancomycin and cefepime, now changed to Rocephin .  Blood cultures 1/3, Streptococcus Blood cultures 1/5, no growth so far Patient has a pacemaker, rule out infection.  TTE not diagnostic.  Scheduled for TEE tomorrow. Bronchodilators.  Chest physiotherapy.  Follow-up blood cultures, sputum cultures. Followed by infectious disease.  Acute on chronic hypoxemic respiratory failure due to multifocal pneumonia:  4 L oxygen in the daytime.  BiPAP at night.  At her baseline.  Pulmonary hypertension: Uses sildenafil, macitentan. On hold due to hypotension.  Continue to hold.  Acute anemia: Suspected acute blood loss anemia. Baseline hemoglobin 9-presented with hemoglobin 8-dropped to 7.  1 unit PRBC given.  Patient anticoagulated with Eliquis.  Holding Eliquis.  IV Protonix.  Avoid NSAIDs. Close monitoring of hemoglobin. Seen by gastroenterology, not planning for any endoscopic evaluation today.  Planning conservative management. Hemoglobin is stable since initial drop.  Acute renal failure superimposed on stage IIIa chronic kidney disease/hyponatremia: Likely multifactorial including UTI, dehydration with continued sign of diuretics, ATN from hypotension.  Holding all diuretics.  On bicarbonate.  Followed by nephrology. Renal functions improving now. Started on bicarbonate infusion by nephrology.  We will check BMP every 12 hours.  Essential hypertension: Blood pressure medications on hold.  Hyperlipidemia: On Crestor continued.  Klebsiella UTI present on admission: On Rocephin.    Sick sinus syndrome: On pacemaker.  Rule out vegetation.  Abnormal LFTs: Suspect shock liver.  No evidence of bile duct obstruction.  Improving.  Status postcholecystectomy.  Right upper quadrant ultrasound fairly stable without any  evidence of cholangitis.   DVT prophylaxis:   SCDs.   Code Status: Full code Family Communication: None. Disposition Plan: Status is: Inpatient  Remains inpatient appropriate because: Significant hemodynamic instability.         Consultants:  Critical care GI Nephrology Infectious disease  Procedures:  None  Antimicrobials:  Rocephin 1/3---   Subjective:  Patient seen and examined.  No overnight events.  Blood pressure remained stable.  Still has some soreness on the right upper quadrant but better than yesterday.  Denies any nausea vomiting.  She does not have any appetite.  No BM since admission. Wearing BiPAP at night that she uses at home.  On oxygen that she uses at home.  Objective: Vitals:   06/27/21 0830 06/27/21 0930 06/27/21 1000 06/27/21 1030  BP: (!) 112/48 (!) 133/56 126/60 (!) 137/54  Pulse: 70 70 70 70  Resp: 17 17 (!) 21 (!) 25  Temp:      TempSrc:      SpO2: 97% 99% 100% 99%  Weight:        Intake/Output Summary (Last 24 hours) at 06/27/2021 1203 Last data filed at 06/27/2021 1048 Gross per 24 hour  Intake --  Output 2050 ml  Net -2050 ml   Filed Weights   06/25/21 0851  Weight: 87.1 kg    Examination:  General exam: Appears calm and comfortable. Frail and debilitated. Not in any distress.  Currently on 4 L oxygen. She is chronically sick looking.  Slightly anxious today. Respiratory system: Some conducted airway sounds.  No added sounds. Cardiovascular system: S1 & S2 heard, RRR.  Pacemaker in place.   Gastrointestinal system: Mildly tender right upper quadrant.  No rigidity or guarding.  Bowel sound present. Central nervous system: Alert and oriented. No focal neurological deficits. Extremities: Symmetric 5 x 5 power. Skin: No rashes, lesions or ulcers Psychiatry: Judgement and insight appear normal. Mood & affect appropriate.  Chronic asymmetrical edema left lower extremity.  Nonpitting.    Data Reviewed: I have personally  reviewed following labs and imaging studies  CBC: Recent Labs  Lab 06/24/21 1456 06/25/21 1350 06/25/21 2243 06/26/21 0847  WBC 20.1* 18.3* 22.8* 14.3*  HGB 8.1* 7.0* 8.2* 8.2*  HCT 25.0* 22.0* 25.3* 25.8*  MCV 85.0 85.9 85.5 84.3  PLT 99* 75* 87* 93*   Basic Metabolic Panel: Recent Labs  Lab 06/24/21 1456 06/25/21 1350 06/25/21 2243 06/26/21 0847 06/27/21 0828  NA 128* 126* 124* 124* 129*  K 4.2 4.9 5.1 5.0 4.5  CL 101 98 98 95* 95*  CO2 19* 20* 15* 18* 24  GLUCOSE 122* 171* 201* 224* 227*  BUN 57* 74* 83* 96* 90*  CREATININE 2.27* 3.11* 3.21* 3.02* 2.05*  CALCIUM 8.2* 7.9* 8.0* 7.7* 7.2*   GFR: Estimated Creatinine Clearance: 26 mL/min (A) (by C-G formula based on SCr of 2.05 mg/dL (H)). Liver Function Tests: Recent Labs  Lab 06/24/21 1456 06/25/21 1350 06/26/21 0847  AST 19 21 16   ALT 12 13 11   ALKPHOS 128* 126 137*  BILITOT 2.7* 3.0* 1.9*  PROT 6.3* 6.2* 6.6  ALBUMIN 2.5* 2.2* 2.3*   Recent Labs  Lab 06/24/21 1456  LIPASE 45   No results for input(s): AMMONIA in the last 168 hours. Coagulation Profile: Recent Labs  Lab 06/25/21 0258  INR 2.1*   Cardiac Enzymes: No results for input(s): CKTOTAL, CKMB, CKMBINDEX, TROPONINI in the last 168 hours. BNP (last  3 results) No results for input(s): PROBNP in the last 8760 hours. HbA1C: Recent Labs    06/25/21 0347  HGBA1C 5.4   CBG: Recent Labs  Lab 06/26/21 0747 06/27/21 0728  GLUCAP 212* 218*   Lipid Profile: Recent Labs    06/26/21 0847  CHOL 89  HDL <10*  LDLCALC NOT CALCULATED  TRIG 132  CHOLHDL NOT CALCULATED   Thyroid Function Tests: No results for input(s): TSH, T4TOTAL, FREET4, T3FREE, THYROIDAB in the last 72 hours. Anemia Panel: No results for input(s): VITAMINB12, FOLATE, FERRITIN, TIBC, IRON, RETICCTPCT in the last 72 hours. Sepsis Labs: Recent Labs  Lab 06/25/21 0347 06/25/21 0433 06/25/21 1350 06/26/21 0847 06/27/21 0828  PROCALCITON 18.79  --   --  12.96 5.80   LATICACIDVEN  --  1.0 1.0  --   --     Recent Results (from the past 240 hour(s))  Blood culture (routine x 2)     Status: Abnormal (Preliminary result)   Collection Time: 06/25/21  2:33 AM   Specimen: BLOOD RIGHT FOREARM  Result Value Ref Range Status   Specimen Description   Final    BLOOD RIGHT FOREARM Performed at [facility], [address], [place], [place] [address]    Special Requests   Final    BOTTLES DRAWN AEROBIC AND ANAEROBIC Blood Culture results may not be optimal due to an inadequate volume of blood received in culture bottles Performed at [facility], [address] [place], [place] [address]    Culture  Setup Time   Final    GRAM POSITIVE COCCI IN BOTH AEROBIC AND ANAEROBIC BOTTLES Organism ID to follow CRITICAL RESULT CALLED TO, READ BACK BY AND VERIFIED WITHArdeen Garland PYPPJK [id number] 06/25/21 HNM Performed at [facility], [address] [place], [place] [address]    Culture (A)  Final    STREPTOCOCCUS GROUP G SUSCEPTIBILITIES TO FOLLOW Performed at [facility], [place] [address], [place],  [address]    Report Status PENDING  Incomplete  Blood Culture ID Panel (Reflexed)     Status: Abnormal   Collection Time: 06/25/21  2:33 AM  Result Value Ref Range Status   Enterococcus faecalis NOT DETECTED NOT DETECTED Final   Enterococcus Faecium NOT DETECTED NOT DETECTED Final   Listeria monocytogenes NOT DETECTED NOT DETECTED Final   Staphylococcus species NOT DETECTED NOT DETECTED Final   Staphylococcus aureus (BCID) NOT DETECTED NOT DETECTED Final   Staphylococcus epidermidis NOT DETECTED NOT DETECTED Final   Staphylococcus lugdunensis NOT DETECTED NOT DETECTED Final   Streptococcus species DETECTED (A) NOT DETECTED Final    Comment: Not Enterococcus species, Streptococcus agalactiae, Streptococcus pyogenes, or Streptococcus pneumoniae. CRITICAL RESULT CALLED TO, READ BACK BY AND VERIFIED WITH: Muse PJASNK  [id number] 06/25/21 HNM    Streptococcus agalactiae NOT DETECTED NOT DETECTED Final   Streptococcus pneumoniae NOT DETECTED NOT DETECTED Final   Streptococcus pyogenes NOT DETECTED NOT DETECTED Final   A.calcoaceticus-baumannii NOT DETECTED NOT DETECTED Final   Bacteroides fragilis NOT DETECTED NOT DETECTED Final   Enterobacterales NOT DETECTED NOT DETECTED Final   Enterobacter cloacae complex NOT DETECTED NOT DETECTED Final   Escherichia coli NOT DETECTED NOT DETECTED Final   Klebsiella aerogenes NOT DETECTED NOT DETECTED Final   Klebsiella oxytoca NOT DETECTED NOT DETECTED Final   Klebsiella pneumoniae NOT DETECTED NOT DETECTED Final   Proteus species NOT DETECTED NOT DETECTED Final   Salmonella species NOT DETECTED NOT DETECTED Final   Serratia marcescens  NOT DETECTED NOT DETECTED Final   Haemophilus influenzae NOT DETECTED NOT DETECTED Final   Neisseria meningitidis NOT DETECTED NOT DETECTED Final   Pseudomonas aeruginosa NOT DETECTED NOT DETECTED Final   Stenotrophomonas maltophilia NOT DETECTED NOT DETECTED Final   Candida albicans NOT DETECTED NOT DETECTED Final   Candida auris NOT DETECTED NOT DETECTED Final   Candida glabrata NOT DETECTED NOT DETECTED Final   Candida krusei NOT DETECTED NOT DETECTED Final   Candida parapsilosis NOT DETECTED NOT DETECTED Final   Candida tropicalis NOT DETECTED NOT DETECTED Final   Cryptococcus neoformans/gattii NOT DETECTED NOT DETECTED Final    Comment: Performed at [facility], [address] [place], [place] [address]  Resp Panel by RT-PCR (Flu A&B, Covid) Nasopharyngeal Swab     Status: None   Collection Time: 06/25/21  3:33 AM   Specimen: Nasopharyngeal Swab; Nasopharyngeal(NP) swabs in vial transport medium  Result Value Ref Range Status   SARS Coronavirus 2 by RT PCR NEGATIVE NEGATIVE Final    Comment: (NOTE) SARS-CoV-2 target nucleic acids are NOT DETECTED.  The SARS-CoV-2 RNA is generally detectable in upper  respiratory specimens during the acute phase of infection. The lowest concentration of SARS-CoV-2 viral copies this assay can detect is 138 copies/mL. A negative result does not preclude SARS-Cov-2 infection and should not be used as the sole basis for treatment or other patient management decisions. A negative result may occur with  improper specimen collection/handling, submission of specimen other than nasopharyngeal swab, presence of viral mutation(s) within the areas targeted by this assay, and inadequate number of viral copies(<138 copies/mL). A negative result must be combined with clinical observations, patient history, and epidemiological information. The expected result is Negative.  Fact Sheet for Patients:  EntrepreneurPulse.com.au  Fact Sheet for Healthcare Providers:  IncredibleEmployment.be  This test is no t yet approved or cleared by the [place] FDA and  has been authorized for detection and/or diagnosis of SARS-CoV-2 by FDA under an Emergency Use Authorization (EUA). This EUA will remain  in effect (meaning this test can be used) for the duration of the COVID-19 declaration under Section 564(b)(1) of the Act, 21 U.S.C.section 360bbb-3(b)(1), unless the authorization is terminated  or revoked sooner.       Influenza A by PCR NEGATIVE NEGATIVE Final   Influenza B by PCR NEGATIVE NEGATIVE Final    Comment: (NOTE) The Xpert Xpress SARS-CoV-2/FLU/RSV plus assay is intended as an aid in the diagnosis of influenza from Nasopharyngeal swab specimens and should not be used as a sole basis for treatment. Nasal washings and aspirates are unacceptable for Xpert Xpress SARS-CoV-2/FLU/RSV testing.  Fact Sheet for Patients: EntrepreneurPulse.com.au  Fact Sheet for Healthcare Providers: IncredibleEmployment.be  This test is not yet approved or cleared by the [place] FDA and has been  authorized for detection and/or diagnosis of SARS-CoV-2 by FDA under an Emergency Use Authorization (EUA). This EUA will remain in effect (meaning this test can be used) for the duration of the COVID-19 declaration under Section 564(b)(1) of the Act, 21 U.S.C. section 360bbb-3(b)(1), unless the authorization is terminated or revoked.  Performed at [facility], [address] [place], [place] [address]   Blood culture (routine x 2)     Status: Abnormal (Preliminary result)   Collection Time: 06/25/21  5:55 AM   Specimen: BLOOD  Result Value Ref Range Status   Specimen Description   Final    BLOOD LEFT ARM Performed at [facility], [place],  [place], [place] [address]    Special Requests   Final    BOTTLES DRAWN AEROBIC AND ANAEROBIC Blood Culture adequate volume Performed at [facility], [address] [place], [place] [address]    Culture  Setup Time   Final    GRAM POSITIVE COCCI IN BOTH AEROBIC AND ANAEROBIC BOTTLES CRITICAL VALUE NOTED.  VALUE IS CONSISTENT WITH PREVIOUSLY REPORTED AND CALLED VALUE. Performed at [facility], [address] [place], [place] [address]    Culture STREPTOCOCCUS GROUP G (A)  Final   Report Status PENDING  Incomplete  Urine Culture     Status: Abnormal   Collection Time: 06/25/21  6:55 AM   Specimen: Urine, Clean Catch  Result Value Ref Range Status   Specimen Description   Final    URINE, CLEAN CATCH Performed at [facility], [address], [place], [place] [address]    Special Requests   Final    NONE Performed at [facility], [address] [place], [place] [address]    Culture >=100,000 COLONIES/mL KLEBSIELLA PNEUMONIAE (A)  Final   Report Status 06/27/2021 FINAL  Final   Organism ID, Bacteria KLEBSIELLA PNEUMONIAE (A)  Final      Susceptibility   Klebsiella pneumoniae - MIC*    AMPICILLIN RESISTANT Resistant     CEFAZOLIN <=4 SENSITIVE Sensitive      CEFEPIME <=0.12 SENSITIVE Sensitive     CEFTRIAXONE <=0.25 SENSITIVE Sensitive     CIPROFLOXACIN <=0.25 SENSITIVE Sensitive     GENTAMICIN <=1 SENSITIVE Sensitive     IMIPENEM <=0.25 SENSITIVE Sensitive     NITROFURANTOIN 32 SENSITIVE Sensitive     TRIMETH/SULFA <=20 SENSITIVE Sensitive     AMPICILLIN/SULBACTAM <=2 SENSITIVE Sensitive     PIP/TAZO <=4 SENSITIVE Sensitive     * >=100,000 COLONIES/mL KLEBSIELLA PNEUMONIAE  MRSA Next Gen by PCR, Nasal     Status: None   Collection Time: 06/25/21 10:55 PM   Specimen: Nasal Mucosa; Nasal Swab  Result Value Ref Range Status   MRSA by PCR Next Gen NOT DETECTED NOT DETECTED Final    Comment: (NOTE) [name] (FDA approved for NASAL specimens only), is one component of a comprehensive MRSA colonization surveillance program. It is not intended to diagnose MRSA infection nor to guide or monitor treatment for MRSA infections. Test performance is not FDA approved in patients less than 107 years old. Performed at [facility], [address] [place], [place] [address]          Radiology Studies: ECHOCARDIOGRAM COMPLETE  Result Date: 06/27/2021    ECHOCARDIOGRAM REPORT   Patient Name:   TYMBER STALLINGS Date of Exam: 06/26/2021 Medical Rec #:  [id number]        Height:       67.0 in Accession #:    [id number]       Weight:       192.0 lb Date of Birth:  05/08/1946         BSA:          1.988 m Patient Age:    8 years         BP:           139/44 mmHg Patient Gender: F                HR:           87 bpm. Exam Location:  [facility] Procedure: 2D Echo, Cardiac Doppler and Color Doppler Indications:  R78.81 Bacteremia  History:         Patient has prior history of Echocardiogram examinations, most                  recent 09/12/2019. Cardiomyopathy, Stroke, Arrythmias:Atrial                  Fibrillation; Risk Factors:Hypertension, Dyslipidemia, Diabetes                  and Sleep Apnea.  Sonographer:     Cresenciano Lick RDCS  Referring Phys:  [id number] Barb Merino Diagnosing Phys: Ida Rogue MD IMPRESSIONS  1. Unable to exclude valve endocarditis as detailed below.  2. Left ventricular ejection fraction, by estimation, is 60 to 65%. The left ventricle has normal function. The left ventricle has no regional wall motion abnormalities. There is mild left ventricular hypertrophy. Left ventricular diastolic parameters are indeterminate.  3. Right ventricular systolic function is normal. The right ventricular size is normal. There is moderately elevated pulmonary artery systolic pressure. The estimated right ventricular systolic pressure is 00.3 mmHg.  4. The mitral valve is normal in structure. Mild to moderate mitral valve regurgitation. No evidence of mitral stenosis.  5. Tricuspid valve regurgitation is moderate. Thickening of the tricuspid leaflets, unable to exclude vegetation.  6. The aortic valve is normal in structure. There is moderate calcification of the aortic valve. There is moderate thickening of the aortic valve. Aortic valve regurgitation is not visualized. valve thickening appears worse compared to study in 2021, unable to exclude vegetation.  7. Aortic valve sclerosis/calcification is present, without any evidence of aortic stenosis.  8. The inferior vena cava is normal in size with greater than 50% respiratory variability, suggesting right atrial pressure of 3 mmHg. FINDINGS  Left Ventricle: Left ventricular ejection fraction, by estimation, is 60 to 65%. The left ventricle has normal function. The left ventricle has no regional wall motion abnormalities. The left ventricular internal cavity size was normal in size. There is  mild left ventricular hypertrophy. Left ventricular diastolic parameters are indeterminate. Right Ventricle: The right ventricular size is normal. No increase in right ventricular wall thickness. Right ventricular systolic function is normal. There is moderately elevated pulmonary artery systolic  pressure. The tricuspid regurgitant velocity is 3.41 m/s, and with an assumed right atrial pressure of 5 mmHg, the estimated right ventricular systolic pressure is 49.1 mmHg. Left Atrium: Left atrial size was normal in size. Right Atrium: Right atrial size was normal in size. Pericardium: There is no evidence of pericardial effusion. Mitral Valve: The mitral valve is normal in structure. Mild to moderate mitral valve regurgitation. No evidence of mitral valve stenosis. Tricuspid Valve: The tricuspid valve is normal in structure. Tricuspid valve regurgitation is moderate . No evidence of tricuspid stenosis. Aortic Valve: The aortic valve is normal in structure. There is moderate calcification of the aortic valve. There is moderate thickening of the aortic valve. Aortic valve regurgitation is not visualized. Aortic valve sclerosis/calcification is present, without any evidence of aortic stenosis. Aortic valve mean gradient measures 10.5 mmHg. Aortic valve peak gradient measures 18.4 mmHg. Aortic valve area, by VTI measures 1.73 cm. Pulmonic Valve: The pulmonic valve was normal in structure. Pulmonic valve regurgitation is mild. No evidence of pulmonic stenosis. Aorta: The aortic root is normal in size and structure. Venous: The inferior vena cava is normal in size with greater than 50% respiratory variability, suggesting right atrial pressure of 3 mmHg. IAS/Shunts: No atrial level shunt detected by color  flow Doppler. Additional Comments: A device lead is visualized.  LEFT VENTRICLE PLAX 2D LVIDd:         5.00 cm   Diastology LVIDs:         2.80 cm   LV e' medial:    9.25 cm/s LV PW:         1.20 cm   LV E/e' medial:  14.9 LV IVS:        1.20 cm   LV e' lateral:   7.83 cm/s LVOT diam:     1.90 cm   LV E/e' lateral: 17.6 LV SV:         62 LV SV Index:   31 LVOT Area:     2.84 cm  RIGHT VENTRICLE             IVC RV Basal diam:  3.80 cm     IVC diam: 1.80 cm RV S prime:     18.47 cm/s TAPSE (M-mode): 2.2 cm LEFT ATRIUM              Index        RIGHT ATRIUM           Index LA diam:        4.00 cm 2.01 cm/m   RA Area:     17.40 cm LA Vol (A2C):   75.5 ml 37.99 ml/m  RA Volume:   48.20 ml  24.25 ml/m LA Vol (A4C):   79.3 ml 39.90 ml/m LA Biplane Vol: 78.5 ml 39.50 ml/m  AORTIC VALVE AV Area (Vmax):    1.71 cm AV Area (Vmean):   1.72 cm AV Area (VTI):     1.73 cm AV Vmax:           214.50 cm/s AV Vmean:          152.250 cm/s AV VTI:            0.356 m AV Peak Grad:      18.4 mmHg AV Mean Grad:      10.5 mmHg LVOT Vmax:         129.00 cm/s LVOT Vmean:        92.350 cm/s LVOT VTI:          0.217 m LVOT/AV VTI ratio: 0.61  AORTA Ao Root diam: 2.80 cm Ao Asc diam:  3.50 cm MV E velocity: 137.67 cm/s  TRICUSPID VALVE                             TR Peak grad:   46.5 mmHg                             TR Vmax:        341.00 cm/s                              SHUNTS                             Systemic VTI:  0.22 m                             Systemic Diam: 1.90 cm Ida Rogue MD Electronically signed by Ida Rogue MD Signature Date/Time: 06/27/2021/10:07:07 AM    Final  US Abdomen Limited RUQ (LIVER/GB)  Result Date: 06/26/2021 CLINICAL DATA:  Right upper quadrant pain. EXAM: ULTRASOUND ABDOMEN LIMITED RIGHT UPPER QUADRANT COMPARISON:  CT abdomen/pelvis 06/24/2021. FINDINGS: Gallbladder: Cholecystectomy. Common bile duct: Diameter: 5 mm, within normal limits. Liver: No focal lesion identified. Within normal limits in parenchymal echogenicity. Portal vein is patent on color Doppler imaging with normal direction of blood flow towards the liver. IMPRESSION: Cholecystectomy.  No evidence of acute abnormality. Electronically Signed   By: Margaretha Sheffield M.D.   On: 06/26/2021 09:28        Scheduled Meds:  cholestyramine  4 g Oral Daily   Difluprednate  1 drop Both Eyes BID   DULoxetine  60 mg Oral Daily   gabapentin  300 mg Oral BID   ipratropium-albuterol  3 mL Nebulization Q4H   [START ON 06/28/2021]  methylPREDNISolone (SOLU-MEDROL) injection  40 mg Intravenous Daily   midodrine  10 mg Oral TID WC   pantoprazole (PROTONIX) IV  40 mg Intravenous Q12H   Ensure Max Protein  11 oz Oral Daily   rosuvastatin  10 mg Oral Daily   Continuous Infusions:  cefTRIAXone (ROCEPHIN)  IV Stopped (06/27/21 0735)    sodium bicarbonate (isotonic) infusion in sterile water 100 mL/hr at 06/26/21 2340     LOS: 2 days    Time spent: 40 minutes    Barb Merino, MD [facility] Pager [phone]

## 2021-06-27 NOTE — ED Notes (Signed)
Pharmacy messaged for missing meds 

## 2021-06-27 NOTE — Progress Notes (Signed)
° ° °  [facility] has been requested to perform a transesophageal echocardiogram on Debra Saunders for streptococcus bacteremia.  After careful review of history and examination, the risks and benefits of transesophageal echocardiogram have been explained including risks of esophageal damage, perforation (1:10,000 risk), bleeding, pharyngeal hematoma as well as other potential complications associated with conscious sedation including aspiration, arrhythmia, respiratory failure and death. Alternatives to treatment were discussed, questions were answered. Patient is willing to proceed.   NPO at midnight.  Planning for TEE 06/28/2021.   2D echo 06/27/2021: 1. Unable to exclude valve endocarditis as detailed below.   2. Left ventricular ejection fraction, by estimation, is 60 to 65%. The  left ventricle has normal function. The left ventricle has no regional  wall motion abnormalities. There is mild left ventricular hypertrophy.  Left ventricular diastolic parameters  are indeterminate.   3. Right ventricular systolic function is normal. The right ventricular  size is normal. There is moderately elevated pulmonary artery systolic  pressure. The estimated right ventricular systolic pressure is 50.5 mmHg.   4. The mitral valve is normal in structure. Mild to moderate mitral valve  regurgitation. No evidence of mitral stenosis.   5. Tricuspid valve regurgitation is moderate. Thickening of the tricuspid  leaflets, unable to exclude vegetation.   6. The aortic valve is normal in structure. There is moderate  calcification of the aortic valve. There is moderate thickening of the  aortic valve. Aortic valve regurgitation is not visualized. valve  thickening appears worse compared to study in 2021,  unable to exclude vegetation.   7. Aortic valve sclerosis/calcification is present, without any evidence  of aortic stenosis.   8. The inferior vena cava is normal in size with greater than 50%   respiratory variability, suggesting right atrial pressure of 3 mmHg.    Christell Faith, PA-C 06/27/2021 10:31 AM

## 2021-06-27 NOTE — Progress Notes (Addendum)
[facility]  ROUNDING NOTE   Subjective:   Patient resting comfortably, ill appearing States she feels minimally improved from yesterday Appetite remains poor Remains on 4L [place]  Creatinine improved to 2.03 Recorded urine output of 1.75L in past 24 hours Sodium 129  Objective:  Vital signs in last 24 hours:  Pulse Rate:  [57-71] 70 (01/05 1030) Resp:  [13-28] 25 (01/05 1030) BP: (92-137)/(45-92) 137/54 (01/05 1030) SpO2:  [96 %-100 %] 99 % (01/05 1030)  Weight change:  Filed Weights   06/25/21 0851  Weight: 87.1 kg    Intake/Output: I/O last 3 completed shifts: In: 1190.5 [I.V.:340.5; IV Piggyback:850] Out: 7510 [Urine:1750]   Intake/Output this shift:  Total I/O In: -  Out: 600 [Urine:600]  Physical Exam: General: NAD, ill appearing  Head: Normocephalic, atraumatic. Moist oral mucosal membranes  Eyes: Anicteric  Lungs:  Basilar crackles, 4L [place]  Heart: Irregular rate and rhythm  Abdomen:  Soft, nontender  Extremities:  + peripheral edema in Rt leg.  Neurologic: Nonfocal, moving all four extremities  Skin: No lesions       Basic Metabolic Panel: Recent Labs  Lab 06/24/21 1456 06/25/21 1350 06/25/21 2243 06/26/21 0847 06/27/21 0828  NA 128* 126* 124* 124* 129*  K 4.2 4.9 5.1 5.0 4.5  CL 101 98 98 95* 95*  CO2 19* 20* 15* 18* 24  GLUCOSE 122* 171* 201* 224* 227*  BUN 57* 74* 83* 96* 90*  CREATININE 2.27* 3.11* 3.21* 3.02* 2.05*  CALCIUM 8.2* 7.9* 8.0* 7.7* 7.2*    Liver Function Tests: Recent Labs  Lab 06/24/21 1456 06/25/21 1350 06/26/21 0847  AST 19 21 16   ALT 12 13 11   ALKPHOS 128* 126 137*  BILITOT 2.7* 3.0* 1.9*  PROT 6.3* 6.2* 6.6  ALBUMIN 2.5* 2.2* 2.3*   Recent Labs  Lab 06/24/21 1456  LIPASE 45   No results for input(s): AMMONIA in the last 168 hours.  CBC: Recent Labs  Lab 06/24/21 1456 06/25/21 1350 06/25/21 2243 06/26/21 0847  WBC 20.1* 18.3* 22.8* 14.3*  HGB 8.1* 7.0* 8.2* 8.2*  HCT 25.0* 22.0*  25.3* 25.8*  MCV 85.0 85.9 85.5 84.3  PLT 99* 75* 87* 93*    Cardiac Enzymes: No results for input(s): CKTOTAL, CKMB, CKMBINDEX, TROPONINI in the last 168 hours.  BNP: Invalid input(s): POCBNP  CBG: Recent Labs  Lab 06/26/21 0747 06/27/21 0728  GLUCAP 212* 218*    Microbiology: Results for orders placed or performed during the hospital encounter of 06/25/21  Blood culture (routine x 2)     Status: Abnormal (Preliminary result)   Collection Time: 06/25/21  2:33 AM   Specimen: BLOOD RIGHT FOREARM  Result Value Ref Range Status   Specimen Description   Final    BLOOD RIGHT FOREARM Performed at [facility], [address], [place], [place] [address]    Special Requests   Final    BOTTLES DRAWN AEROBIC AND ANAEROBIC Blood Culture results may not be optimal due to an inadequate volume of blood received in culture bottles Performed at [facility], [address], [place], [place] [address]    Culture  Setup Time   Final    GRAM POSITIVE COCCI IN BOTH AEROBIC AND ANAEROBIC BOTTLES Organism ID to follow CRITICAL RESULT CALLED TO, READ BACK BY AND VERIFIED WITHArdeen Garland MPNTIR [id number] 06/25/21 HNM Performed at [facility], [address] [place], [place] [address]    Culture (A)  Final    STREPTOCOCCUS  GROUP G SUSCEPTIBILITIES TO FOLLOW Performed at [facility], [place] [address], [place], [place] [address]    Report Status PENDING  Incomplete  Blood Culture ID Panel (Reflexed)     Status: Abnormal   Collection Time: 06/25/21  2:33 AM  Result Value Ref Range Status   Enterococcus faecalis NOT DETECTED NOT DETECTED Final   Enterococcus Faecium NOT DETECTED NOT DETECTED Final   Listeria monocytogenes NOT DETECTED NOT DETECTED Final   Staphylococcus species NOT DETECTED NOT DETECTED Final   Staphylococcus aureus (BCID) NOT DETECTED NOT DETECTED Final   Staphylococcus epidermidis NOT DETECTED NOT DETECTED Final   Staphylococcus  lugdunensis NOT DETECTED NOT DETECTED Final   Streptococcus species DETECTED (A) NOT DETECTED Final    Comment: Not Enterococcus species, Streptococcus agalactiae, Streptococcus pyogenes, or Streptococcus pneumoniae. CRITICAL RESULT CALLED TO, READ BACK BY AND VERIFIED WITH: Pierce OHYWVP [id number] 06/25/21 HNM    Streptococcus agalactiae NOT DETECTED NOT DETECTED Final   Streptococcus pneumoniae NOT DETECTED NOT DETECTED Final   Streptococcus pyogenes NOT DETECTED NOT DETECTED Final   A.calcoaceticus-baumannii NOT DETECTED NOT DETECTED Final   Bacteroides fragilis NOT DETECTED NOT DETECTED Final   Enterobacterales NOT DETECTED NOT DETECTED Final   Enterobacter cloacae complex NOT DETECTED NOT DETECTED Final   Escherichia coli NOT DETECTED NOT DETECTED Final   Klebsiella aerogenes NOT DETECTED NOT DETECTED Final   Klebsiella oxytoca NOT DETECTED NOT DETECTED Final   Klebsiella pneumoniae NOT DETECTED NOT DETECTED Final   Proteus species NOT DETECTED NOT DETECTED Final   Salmonella species NOT DETECTED NOT DETECTED Final   Serratia marcescens NOT DETECTED NOT DETECTED Final   Haemophilus influenzae NOT DETECTED NOT DETECTED Final   Neisseria meningitidis NOT DETECTED NOT DETECTED Final   Pseudomonas aeruginosa NOT DETECTED NOT DETECTED Final   Stenotrophomonas maltophilia NOT DETECTED NOT DETECTED Final   Candida albicans NOT DETECTED NOT DETECTED Final   Candida auris NOT DETECTED NOT DETECTED Final   Candida glabrata NOT DETECTED NOT DETECTED Final   Candida krusei NOT DETECTED NOT DETECTED Final   Candida parapsilosis NOT DETECTED NOT DETECTED Final   Candida tropicalis NOT DETECTED NOT DETECTED Final   Cryptococcus neoformans/gattii NOT DETECTED NOT DETECTED Final    Comment: Performed at [facility], [address] [place],  [address]  Resp Panel by RT-PCR (Flu A&B, Covid) Nasopharyngeal Swab     Status: None   Collection Time: 06/25/21  3:33 AM   Specimen:  Nasopharyngeal Swab; Nasopharyngeal(NP) swabs in vial transport medium  Result Value Ref Range Status   SARS Coronavirus 2 by RT PCR NEGATIVE NEGATIVE Final    Comment: (NOTE) SARS-CoV-2 target nucleic acids are NOT DETECTED.  The SARS-CoV-2 RNA is generally detectable in upper respiratory specimens during the acute phase of infection. The lowest concentration of SARS-CoV-2 viral copies this assay can detect is 138 copies/mL. A negative result does not preclude SARS-Cov-2 infection and should not be used as the sole basis for treatment or other patient management decisions. A negative result may occur with  improper specimen collection/handling, submission of specimen other than nasopharyngeal swab, presence of viral mutation(s) within the areas targeted by this assay, and inadequate number of viral copies(<138 copies/mL). A negative result must be combined with clinical observations, patient history, and epidemiological information. The expected result is Negative.  Fact Sheet for Patients:  EntrepreneurPulse.com.au  Fact Sheet for Healthcare Providers:  IncredibleEmployment.be  This test is no t yet approved or cleared by the [place]  FDA and  has been authorized for detection and/or diagnosis of SARS-CoV-2 by FDA under an Emergency Use Authorization (EUA). This EUA will remain  in effect (meaning this test can be used) for the duration of the COVID-19 declaration under Section 564(b)(1) of the Act, 21 U.S.C.section 360bbb-3(b)(1), unless the authorization is terminated  or revoked sooner.       Influenza A by PCR NEGATIVE NEGATIVE Final   Influenza B by PCR NEGATIVE NEGATIVE Final    Comment: (NOTE) The Xpert Xpress SARS-CoV-2/FLU/RSV plus assay is intended as an aid in the diagnosis of influenza from Nasopharyngeal swab specimens and should not be used as a sole basis for treatment. Nasal washings and aspirates are unacceptable for  Xpert Xpress SARS-CoV-2/FLU/RSV testing.  Fact Sheet for Patients: EntrepreneurPulse.com.au  Fact Sheet for Healthcare Providers: IncredibleEmployment.be  This test is not yet approved or cleared by the [place] FDA and has been authorized for detection and/or diagnosis of SARS-CoV-2 by FDA under an Emergency Use Authorization (EUA). This EUA will remain in effect (meaning this test can be used) for the duration of the COVID-19 declaration under Section 564(b)(1) of the Act, 21 U.S.C. section 360bbb-3(b)(1), unless the authorization is terminated or revoked.  Performed at [facility], [address] [place], [place] [address]   Blood culture (routine x 2)     Status: Abnormal (Preliminary result)   Collection Time: 06/25/21  5:55 AM   Specimen: BLOOD  Result Value Ref Range Status   Specimen Description   Final    BLOOD LEFT ARM Performed at [facility], [address], [place], [place] [address]    Special Requests   Final    BOTTLES DRAWN AEROBIC AND ANAEROBIC Blood Culture adequate volume Performed at [facility], [address], [place], [place] [address]    Culture  Setup Time   Final    GRAM POSITIVE COCCI IN BOTH AEROBIC AND ANAEROBIC BOTTLES CRITICAL VALUE NOTED.  VALUE IS CONSISTENT WITH PREVIOUSLY REPORTED AND CALLED VALUE. Performed at [facility], [address] [place], [place] [address]    Culture STREPTOCOCCUS GROUP G (A)  Final   Report Status PENDING  Incomplete  Urine Culture     Status: Abnormal   Collection Time: 06/25/21  6:55 AM   Specimen: Urine, Clean Catch  Result Value Ref Range Status   Specimen Description   Final    URINE, CLEAN CATCH Performed at [facility], [name], [place], [place] [address]    Special Requests   Final    NONE Performed at [facility], [address] [place],  [address]    Culture >=100,000  COLONIES/mL KLEBSIELLA PNEUMONIAE (A)  Final   Report Status 06/27/2021 FINAL  Final   Organism ID, Bacteria KLEBSIELLA PNEUMONIAE (A)  Final      Susceptibility   Klebsiella pneumoniae - MIC*    AMPICILLIN RESISTANT Resistant     CEFAZOLIN <=4 SENSITIVE Sensitive     CEFEPIME <=0.12 SENSITIVE Sensitive     CEFTRIAXONE <=0.25 SENSITIVE Sensitive     CIPROFLOXACIN <=0.25 SENSITIVE Sensitive     GENTAMICIN <=1 SENSITIVE Sensitive     IMIPENEM <=0.25 SENSITIVE Sensitive     NITROFURANTOIN 32 SENSITIVE Sensitive     TRIMETH/SULFA <=20 SENSITIVE Sensitive     AMPICILLIN/SULBACTAM <=2 SENSITIVE Sensitive     PIP/TAZO <=4 SENSITIVE Sensitive     * >=100,000 COLONIES/mL KLEBSIELLA PNEUMONIAE  MRSA Next Gen by PCR, Nasal  Status: None   Collection Time: 06/25/21 10:55 PM   Specimen: Nasal Mucosa; Nasal Swab  Result Value Ref Range Status   MRSA by PCR Next Gen NOT DETECTED NOT DETECTED Final    Comment: (NOTE) [name] (FDA approved for NASAL specimens only), is one component of a comprehensive MRSA colonization surveillance program. It is not intended to diagnose MRSA infection nor to guide or monitor treatment for MRSA infections. Test performance is not FDA approved in patients less than 46 years old. Performed at [facility], [address] [place], [place] [address]    *Note: Due to a large number of results and/or encounters for the requested time period, some results have not been displayed. A complete set of results can be found in Results Review.    Coagulation Studies: Recent Labs    06/25/21 0258  LABPROT 23.6*  INR 2.1*    Urinalysis: Recent Labs    06/25/21 0610  COLORURINE AMBER*  LABSPEC 1.016  PHURINE 5.0  GLUCOSEU NEGATIVE  HGBUR NEGATIVE  BILIRUBINUR NEGATIVE  KETONESUR NEGATIVE  PROTEINUR 30*  NITRITE NEGATIVE  LEUKOCYTESUR SMALL*      Imaging: ECHOCARDIOGRAM COMPLETE  Result Date: 06/27/2021    ECHOCARDIOGRAM  REPORT   Patient Name:   JAX ABDELRAHMAN Date of Exam: 06/26/2021 Medical Rec #:  [id number]        Height:       67.0 in Accession #:    [id number]       Weight:       192.0 lb Date of Birth:  Nov 28, 1945         BSA:          1.988 m Patient Age:    42 years         BP:           139/44 mmHg Patient Gender: F                HR:           87 bpm. Exam Location:  [facility] Procedure: 2D Echo, Cardiac Doppler and Color Doppler Indications:     R78.81 Bacteremia  History:         Patient has prior history of Echocardiogram examinations, most                  recent 09/12/2019. Cardiomyopathy, Stroke, Arrythmias:Atrial                  Fibrillation; Risk Factors:Hypertension, Dyslipidemia, Diabetes                  and Sleep Apnea.  Sonographer:     Cresenciano Lick RDCS Referring Phys:  [id number] Barb Merino Diagnosing Phys: Ida Rogue MD IMPRESSIONS  1. Unable to exclude valve endocarditis as detailed below.  2. Left ventricular ejection fraction, by estimation, is 60 to 65%. The left ventricle has normal function. The left ventricle has no regional wall motion abnormalities. There is mild left ventricular hypertrophy. Left ventricular diastolic parameters are indeterminate.  3. Right ventricular systolic function is normal. The right ventricular size is normal. There is moderately elevated pulmonary artery systolic pressure. The estimated right ventricular systolic pressure is 35.4 mmHg.  4. The mitral valve is normal in structure. Mild to moderate mitral valve regurgitation. No evidence of mitral stenosis.  5. Tricuspid valve regurgitation is moderate. Thickening of the tricuspid leaflets, unable to exclude vegetation.  6. The aortic valve is normal in structure. There  is moderate calcification of the aortic valve. There is moderate thickening of the aortic valve. Aortic valve regurgitation is not visualized. valve thickening appears worse compared to study in 2021, unable to exclude vegetation.  7. Aortic valve  sclerosis/calcification is present, without any evidence of aortic stenosis.  8. The inferior vena cava is normal in size with greater than 50% respiratory variability, suggesting right atrial pressure of 3 mmHg. FINDINGS  Left Ventricle: Left ventricular ejection fraction, by estimation, is 60 to 65%. The left ventricle has normal function. The left ventricle has no regional wall motion abnormalities. The left ventricular internal cavity size was normal in size. There is  mild left ventricular hypertrophy. Left ventricular diastolic parameters are indeterminate. Right Ventricle: The right ventricular size is normal. No increase in right ventricular wall thickness. Right ventricular systolic function is normal. There is moderately elevated pulmonary artery systolic pressure. The tricuspid regurgitant velocity is 3.41 m/s, and with an assumed right atrial pressure of 5 mmHg, the estimated right ventricular systolic pressure is 40.9 mmHg. Left Atrium: Left atrial size was normal in size. Right Atrium: Right atrial size was normal in size. Pericardium: There is no evidence of pericardial effusion. Mitral Valve: The mitral valve is normal in structure. Mild to moderate mitral valve regurgitation. No evidence of mitral valve stenosis. Tricuspid Valve: The tricuspid valve is normal in structure. Tricuspid valve regurgitation is moderate . No evidence of tricuspid stenosis. Aortic Valve: The aortic valve is normal in structure. There is moderate calcification of the aortic valve. There is moderate thickening of the aortic valve. Aortic valve regurgitation is not visualized. Aortic valve sclerosis/calcification is present, without any evidence of aortic stenosis. Aortic valve mean gradient measures 10.5 mmHg. Aortic valve peak gradient measures 18.4 mmHg. Aortic valve area, by VTI measures 1.73 cm. Pulmonic Valve: The pulmonic valve was normal in structure. Pulmonic valve regurgitation is mild. No evidence of pulmonic  stenosis. Aorta: The aortic root is normal in size and structure. Venous: The inferior vena cava is normal in size with greater than 50% respiratory variability, suggesting right atrial pressure of 3 mmHg. IAS/Shunts: No atrial level shunt detected by color flow Doppler. Additional Comments: A device lead is visualized.  LEFT VENTRICLE PLAX 2D LVIDd:         5.00 cm   Diastology LVIDs:         2.80 cm   LV e' medial:    9.25 cm/s LV PW:         1.20 cm   LV E/e' medial:  14.9 LV IVS:        1.20 cm   LV e' lateral:   7.83 cm/s LVOT diam:     1.90 cm   LV E/e' lateral: 17.6 LV SV:         62 LV SV Index:   31 LVOT Area:     2.84 cm  RIGHT VENTRICLE             IVC RV Basal diam:  3.80 cm     IVC diam: 1.80 cm RV S prime:     18.47 cm/s TAPSE (M-mode): 2.2 cm LEFT ATRIUM             Index        RIGHT ATRIUM           Index LA diam:        4.00 cm 2.01 cm/m   RA Area:     17.40 cm LA Vol (A2C):  75.5 ml 37.99 ml/m  RA Volume:   48.20 ml  24.25 ml/m LA Vol (A4C):   79.3 ml 39.90 ml/m LA Biplane Vol: 78.5 ml 39.50 ml/m  AORTIC VALVE AV Area (Vmax):    1.71 cm AV Area (Vmean):   1.72 cm AV Area (VTI):     1.73 cm AV Vmax:           214.50 cm/s AV Vmean:          152.250 cm/s AV VTI:            0.356 m AV Peak Grad:      18.4 mmHg AV Mean Grad:      10.5 mmHg LVOT Vmax:         129.00 cm/s LVOT Vmean:        92.350 cm/s LVOT VTI:          0.217 m LVOT/AV VTI ratio: 0.61  AORTA Ao Root diam: 2.80 cm Ao Asc diam:  3.50 cm MV E velocity: 137.67 cm/s  TRICUSPID VALVE                             TR Peak grad:   46.5 mmHg                             TR Vmax:        341.00 cm/s                              SHUNTS                             Systemic VTI:  0.22 m                             Systemic Diam: 1.90 cm Ida Rogue MD Electronically signed by Ida Rogue MD Signature Date/Time: 06/27/2021/10:07:07 AM    Final    US Abdomen Limited RUQ (LIVER/GB)  Result Date: 06/26/2021 CLINICAL DATA:  Right upper  quadrant pain. EXAM: ULTRASOUND ABDOMEN LIMITED RIGHT UPPER QUADRANT COMPARISON:  CT abdomen/pelvis 06/24/2021. FINDINGS: Gallbladder: Cholecystectomy. Common bile duct: Diameter: 5 mm, within normal limits. Liver: No focal lesion identified. Within normal limits in parenchymal echogenicity. Portal vein is patent on color Doppler imaging with normal direction of blood flow towards the liver. IMPRESSION: Cholecystectomy.  No evidence of acute abnormality. Electronically Signed   By: Margaretha Sheffield M.D.   On: 06/26/2021 09:28     Medications:    cefTRIAXone (ROCEPHIN)  IV Stopped (06/27/21 0735)    sodium bicarbonate (isotonic) infusion in sterile water 100 mL/hr at 06/26/21 2340    cholestyramine  4 g Oral Daily   Difluprednate  1 drop Both Eyes BID   DULoxetine  60 mg Oral Daily   gabapentin  300 mg Oral BID   ipratropium-albuterol  3 mL Nebulization Q4H   [START ON 06/28/2021] methylPREDNISolone (SOLU-MEDROL) injection  40 mg Intravenous Daily   midodrine  10 mg Oral TID WC   pantoprazole (PROTONIX) IV  40 mg Intravenous Q12H   Ensure Max Protein  11 oz Oral Daily   rosuvastatin  10 mg Oral Daily   acetaminophen, albuterol, dextromethorphan-guaiFENesin, meclizine, Menthol, morphine injection, ondansetron (ZOFRAN) IV  Assessment/ Plan:  Ms. Debra  TAEYA Saunders is a 76 y.o.  female  with medical problems of hypertension, hyperlipidemia, diabetes, depression, subdural hematoma, sinoatrial node dysfunction with history of pacemaker placement, history of obstructive sleep apnea, pulmonary arterial hypertension, interstitial lung disease on chronic 4 L oxygen, diastolic CHF, atrial fibrillation, anemia, history of DVT  was admitted on 06/25/2021 for Sepsis due to pneumonia ([place]) [J18.9, A41.9] Multifocal pneumonia [J18.9]  Urinalysis June 25, 2021: Small leukocytes, 30 mg protein, many bacteria, 6-10 WBCs, 0-5 RBCs. Urine culture greater than 100,000 Klebsiella pneumoniae Imaging: CT abdomen  noncontrast June 24, 2021:Atherosclerotic disease of abdominal aorta, IVC filter present, normal kidneys and bladder.  Acute Kidney Injury with hyponatremia on chronic kidney disease stage 3b with baseline creatinine 1.04 and GFR of 55 on 04/29/21.  Acute kidney injury secondary to concurrent sepsis and hypotension No indication of dialysis at this time.   Creatinine improved today, Continue IVF for now.   Sodium 129  Avoid hypotension and other nephrotoxic agents  Lab Results  Component Value Date   CREATININE 2.05 (H) 06/27/2021   CREATININE 3.02 (H) 06/26/2021   CREATININE 3.21 (H) 06/25/2021    Intake/Output Summary (Last 24 hours) at 06/27/2021 1234 Last data filed at 06/27/2021 1048 Gross per 24 hour  Intake --  Output 2050 ml  Net -2050 ml   2. Sepsis-Streptococcus bacteremia and Klebsiella pneumoniae in urine, multifocal pneumonia. Currently on Rocephin   3. 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

## 2021-06-27 NOTE — H&P (Signed)
Debra Saunders , MD [address], [place], [place], [place], [address] [address] [place], [place], [place], [address] Phone: [phone]  Fax: [place] is being followed for GI bleed  Day 1 of follow up   Subjective: Feels much better.  No abdominal pain.  No further bowel movements or blood in her stools   Objective: Vital signs in last 24 hours: Vitals:   06/27/21 0430 06/27/21 0500 06/27/21 0530 06/27/21 0830  BP: (!) 106/50 (!) 108/47 (!) 106/45 (!) 112/48  Pulse: 70 70 70 70  Resp: 16 16 17 17   Temp:      TempSrc:      SpO2: 100% 100% 99% 97%  Weight:       Weight change:   Intake/Output Summary (Last 24 hours) at 06/27/2021 0835 Last data filed at 06/27/2021 0541 Gross per 24 hour  Intake 940.46 ml  Output 1450 ml  Net -509.54 ml     Exam: Heart:: S1S2 present or without murmur or extra heart sounds Lungs:  decreased air entry b/l  Abdomen: soft, nontender, normal bowel sounds   Lab Results: @LABTEST2 @ Micro Results: Recent Results (from the past 240 hour(s))  Blood culture (routine x 2)     Status: None (Preliminary result)   Collection Time: 06/25/21  2:33 AM   Specimen: BLOOD RIGHT FOREARM  Result Value Ref Range Status   Specimen Description   Final    BLOOD RIGHT FOREARM Performed at [facility], [address] [place], [place] [address]    Special Requests   Final    BOTTLES DRAWN AEROBIC AND ANAEROBIC Blood Culture results may not be optimal due to an inadequate volume of blood received in culture bottles Performed at [facility], [address] [place], [place] [address]    Culture  Setup Time   Final    GRAM POSITIVE COCCI IN BOTH AEROBIC AND ANAEROBIC BOTTLES Organism ID to follow CRITICAL RESULT CALLED TO, READ BACK BY AND VERIFIED WITHArdeen Garland [id number] 06/25/21 HNM Performed at [facility], [address] [name], [place] [address]    Culture   Final    Lonell Grandchild  POSITIVE COCCI TOO YOUNG TO READ Performed at [facility], [place] [address] [place], [place] [address]    Report Status PENDING  Incomplete  Blood Culture ID Panel (Reflexed)     Status: Abnormal   Collection Time: 06/25/21  2:33 AM  Result Value Ref Range Status   Enterococcus faecalis NOT DETECTED NOT DETECTED Final   Enterococcus Faecium NOT DETECTED NOT DETECTED Final   Listeria monocytogenes NOT DETECTED NOT DETECTED Final   Staphylococcus species NOT DETECTED NOT DETECTED Final   Staphylococcus aureus (BCID) NOT DETECTED NOT DETECTED Final   Staphylococcus epidermidis NOT DETECTED NOT DETECTED Final   Staphylococcus lugdunensis NOT DETECTED NOT DETECTED Final   Streptococcus species DETECTED (A) NOT DETECTED Final    Comment: Not Enterococcus species, Streptococcus agalactiae, Streptococcus pyogenes, or Streptococcus pneumoniae. CRITICAL RESULT CALLED TO, READ BACK BY AND VERIFIED WITH: MORGAN HICKS [id number] 06/25/21 HNM    Streptococcus agalactiae NOT DETECTED NOT DETECTED Final   Streptococcus pneumoniae NOT DETECTED NOT DETECTED Final   Streptococcus pyogenes NOT DETECTED NOT DETECTED Final   A.calcoaceticus-baumannii NOT DETECTED NOT DETECTED Final   Bacteroides fragilis NOT DETECTED NOT DETECTED Final   Enterobacterales NOT DETECTED NOT DETECTED Final   Enterobacter cloacae complex NOT DETECTED NOT DETECTED Final  Escherichia coli NOT DETECTED NOT DETECTED Final   Klebsiella aerogenes NOT DETECTED NOT DETECTED Final   Klebsiella oxytoca NOT DETECTED NOT DETECTED Final   Klebsiella pneumoniae NOT DETECTED NOT DETECTED Final   Proteus species NOT DETECTED NOT DETECTED Final   Salmonella species NOT DETECTED NOT DETECTED Final   Serratia marcescens NOT DETECTED NOT DETECTED Final   Haemophilus influenzae NOT DETECTED NOT DETECTED Final   Neisseria meningitidis NOT DETECTED NOT DETECTED Final   Pseudomonas aeruginosa NOT DETECTED NOT DETECTED Final    Stenotrophomonas maltophilia NOT DETECTED NOT DETECTED Final   Candida albicans NOT DETECTED NOT DETECTED Final   Candida auris NOT DETECTED NOT DETECTED Final   Candida glabrata NOT DETECTED NOT DETECTED Final   Candida krusei NOT DETECTED NOT DETECTED Final   Candida parapsilosis NOT DETECTED NOT DETECTED Final   Candida tropicalis NOT DETECTED NOT DETECTED Final   Cryptococcus neoformans/gattii NOT DETECTED NOT DETECTED Final    Comment: Performed at [facility], [address] [place], [place] [address]  Resp Panel by RT-PCR (Flu A&B, Covid) Nasopharyngeal Swab     Status: None   Collection Time: 06/25/21  3:33 AM   Specimen: Nasopharyngeal Swab; Nasopharyngeal(NP) swabs in vial transport medium  Result Value Ref Range Status   SARS Coronavirus 2 by RT PCR NEGATIVE NEGATIVE Final    Comment: (NOTE) SARS-CoV-2 target nucleic acids are NOT DETECTED.  The SARS-CoV-2 RNA is generally detectable in upper respiratory specimens during the acute phase of infection. The lowest concentration of SARS-CoV-2 viral copies this assay can detect is 138 copies/mL. A negative result does not preclude SARS-Cov-2 infection and should not be used as the sole basis for treatment or other patient management decisions. A negative result may occur with  improper specimen collection/handling, submission of specimen other than nasopharyngeal swab, presence of viral mutation(s) within the areas targeted by this assay, and inadequate number of viral copies(<138 copies/mL). A negative result must be combined with clinical observations, patient history, and epidemiological information. The expected result is Negative.  Fact Sheet for Patients:  EntrepreneurPulse.com.au  Fact Sheet for Healthcare Providers:  IncredibleEmployment.be  This test is no t yet approved or cleared by the [place] FDA and  has been authorized for detection and/or diagnosis of  SARS-CoV-2 by FDA under an Emergency Use Authorization (EUA). This EUA will remain  in effect (meaning this test can be used) for the duration of the COVID-19 declaration under Section 564(b)(1) of the Act, 21 U.S.C.section 360bbb-3(b)(1), unless the authorization is terminated  or revoked sooner.       Influenza A by PCR NEGATIVE NEGATIVE Final   Influenza B by PCR NEGATIVE NEGATIVE Final    Comment: (NOTE) The Xpert Xpress SARS-CoV-2/FLU/RSV plus assay is intended as an aid in the diagnosis of influenza from Nasopharyngeal swab specimens and should not be used as a sole basis for treatment. Nasal washings and aspirates are unacceptable for Xpert Xpress SARS-CoV-2/FLU/RSV testing.  Fact Sheet for Patients: EntrepreneurPulse.com.au  Fact Sheet for Healthcare Providers: IncredibleEmployment.be  This test is not yet approved or cleared by the [place] FDA and has been authorized for detection and/or diagnosis of SARS-CoV-2 by FDA under an Emergency Use Authorization (EUA). This EUA will remain in effect (meaning this test can be used) for the duration of the COVID-19 declaration under Section 564(b)(1) of the Act, 21 U.S.C. section 360bbb-3(b)(1), unless the authorization is terminated or revoked.  Performed at [facility], [place], [place]  [address]   Blood culture (routine x 2)     Status: None (Preliminary result)   Collection Time: 06/25/21  5:55 AM   Specimen: BLOOD  Result Value Ref Range Status   Specimen Description   Final    BLOOD LEFT ARM Performed at [facility], [address] [place], [place] [address]    Special Requests   Final    BOTTLES DRAWN AEROBIC AND ANAEROBIC Blood Culture adequate volume Performed at [facility], [address], [place], [place] [address]    Culture  Setup Time   Final    GRAM POSITIVE COCCI IN BOTH AEROBIC AND ANAEROBIC BOTTLES CRITICAL  VALUE NOTED.  VALUE IS CONSISTENT WITH PREVIOUSLY REPORTED AND CALLED VALUE. Performed at [facility], [address], [place], [place] [address]    Culture   Final    Lonell Grandchild POSITIVE COCCI TOO YOUNG TO READ Performed at [facility], [place] [address], [place], [place] [address]    Report Status PENDING  Incomplete  Urine Culture     Status: Abnormal (Preliminary result)   Collection Time: 06/25/21  6:55 AM   Specimen: Urine, Clean Catch  Result Value Ref Range Status   Specimen Description   Final    URINE, CLEAN CATCH Performed at [facility], [address], [place], [place] [address]    Special Requests   Final    NONE Performed at [facility], [address], [place], [place] [address]    Culture (A)  Final    >=100,000 COLONIES/mL KLEBSIELLA PNEUMONIAE SUSCEPTIBILITIES TO FOLLOW Performed at [facility], [place] [address] [place], [place] [address]    Report Status PENDING  Incomplete  MRSA Next Gen by PCR, Nasal     Status: None   Collection Time: 06/25/21 10:55 PM   Specimen: Nasal Mucosa; Nasal Swab  Result Value Ref Range Status   MRSA by PCR Next Gen NOT DETECTED NOT DETECTED Final    Comment: (NOTE) [name] (FDA approved for NASAL specimens only), is one component of a comprehensive MRSA colonization surveillance program. It is not intended to diagnose MRSA infection nor to guide or monitor treatment for MRSA infections. Test performance is not FDA approved in patients less than 73 years old. Performed at [facility], [address] [place], [place] [address]    Studies/Results: US Abdomen Limited RUQ (LIVER/GB)  Result Date: 06/26/2021 CLINICAL DATA:  Right upper quadrant pain. EXAM: ULTRASOUND ABDOMEN LIMITED RIGHT UPPER QUADRANT COMPARISON:  CT abdomen/pelvis 06/24/2021. FINDINGS: Gallbladder: Cholecystectomy. Common bile duct: Diameter: 5 mm, within normal limits. Liver: No focal lesion  identified. Within normal limits in parenchymal echogenicity. Portal vein is patent on color Doppler imaging with normal direction of blood flow towards the liver. IMPRESSION: Cholecystectomy.  No evidence of acute abnormality. Electronically Signed   By: Margaretha Sheffield M.D.   On: 06/26/2021 09:28   Medications: I have reviewed the patient's current medications. Scheduled Meds:  cholestyramine  4 g Oral Daily   Difluprednate  1 drop Both Eyes BID   DULoxetine  60 mg Oral Daily   gabapentin  300 mg Oral BID   ipratropium-albuterol  3 mL Nebulization Q4H   methylPREDNISolone (SOLU-MEDROL) injection  40 mg Intravenous Q12H   midodrine  10 mg Oral TID WC   pantoprazole (PROTONIX) IV  40 mg Intravenous Q12H   Ensure Max Protein  11 oz Oral Daily   rosuvastatin  10 mg Oral  Daily   Continuous Infusions:  cefTRIAXone (ROCEPHIN)  IV Stopped (06/27/21 0735)   norepinephrine (LEVOPHED) Adult infusion Stopped (06/26/21 1333)    sodium bicarbonate (isotonic) infusion in sterile water 100 mL/hr at 06/26/21 2340   PRN Meds:.acetaminophen, albuterol, dextromethorphan-guaiFENesin, meclizine, Menthol, morphine injection, ondansetron (ZOFRAN) IV   Assessment: Principal Problem:   Severe sepsis with septic shock (HCC) Active Problems:   Pulmonary hypertension, unspecified (HCC)   Acute on chronic respiratory failure with hypoxia (HCC)   Normocytic anemia   Interstitial lung disease (HCC)   UTI (urinary tract infection)   Atrial fibrillation (HCC)   Multifocal pneumonia   HTN (hypertension)   HLD (hyperlipidemia)   Chronic diastolic CHF (congestive heart failure) (HCC)   Acute renal failure superimposed on stage 3a chronic kidney disease (HCC)   DVT (deep venous thrombosis) (HCC)   Hyponatremia   Thrombocytopenia (HCC)   Diarrhea   Bacteremia due to Gram-positive bacteria   GI bleeding   Black stool  CBC Latest Ref Rng & Units 06/26/2021 06/25/2021 06/25/2021  WBC 4.0 - 10.5 K/uL 14.3(H)  22.8(H) 18.3(H)  Hemoglobin 12.0 - 15.0 g/dL 8.2(L) 8.2(L) 7.0(L)  Hematocrit 36.0 - 46.0 % 25.8(L) 25.3(L) 22.0(L)  Platelets 150 - 400 K/uL 93(L) 87(L) 75(L)    Debra Saunders is a 76 y.o. y/o female with a history of interstitial lung disease on long-term oxygen, pulmonary hypertension presented to the emergency room with septic shock likely secondary to bacteremia from underlying pneumonia affecting the right upper lobe and lower lobe.  Admission complicated by AKI.  The patient is on a blood thinner and I have been consulted for drop in hemoglobin from 9 g at baseline to 7 g.  Improved to 8 g with a unit of blood transfusion and 1 episode of dark stools.  She previously has followed with Dr. Haig Prophet as an outpatient for chronic diarrhea felt to be secondary to medications used to treat pulmonary hypertension.  At that point of time it was felt that any endoscopy procedure was high risk due to her comorbidities.  At this point of time I also think she is very sick to perform a colonoscopy or an endoscopy unless she has a life-threatening GI hemorrhage .  Her troponin is 201 which is elevated.  I would suggest to treat her conservatively with monitoring her CBC closely and transfuse as needed, IV PPI.  Hold blood thinners.     If there is an acute hemorrhage consider CT angiogram or tagged RBC scan.  If there is no other option to evaluate a GI bleed and endoscopy the only option, She would be at a very high risk for anesthesia complications with her comorbidities at this point of time.  The pain in the right side of her chest and abdomen when she takes a deep breath is probably related to the pneumonia which has significantly improved today .  RUQ USG showed no abnormalities and no significant elevation of her LFT's      LOS: 2 days   Debra Bellows, MD 06/27/2021, 8:35 AM

## 2021-06-27 NOTE — Progress Notes (Signed)
Date of Admission:  06/25/2021     ID: Debra Saunders is a 76 y.o. female  Principal Problem:   Severe sepsis with septic shock ([place]) Active Problems:   Pulmonary hypertension, unspecified ([place])   Acute on chronic respiratory failure with hypoxia (HCC)   Normocytic anemia   Interstitial lung disease (HCC)   UTI (urinary tract infection)   Atrial fibrillation (HCC)   Multifocal pneumonia   HTN (hypertension)   HLD (hyperlipidemia)   Chronic diastolic CHF (congestive heart failure) (HCC)   Acute renal failure superimposed on stage 3a chronic kidney disease (HCC)   DVT (deep venous thrombosis) (HCC)   Hyponatremia   Thrombocytopenia (HCC)   Diarrhea   Bacteremia due to Gram-positive bacteria   GI bleeding   Black stool    Subjective: T says she is feeling much better Breathing better No fever Has chronic diarrhea and takes cholestyramine for that-  Today had it after drinking liquids  Medications:   cholestyramine  4 g Oral Daily   Difluprednate  1 drop Both Eyes BID   DULoxetine  60 mg Oral Daily   gabapentin  300 mg Oral BID   ipratropium-albuterol  3 mL Nebulization Q4H   [START ON 06/28/2021] methylPREDNISolone (SOLU-MEDROL) injection  40 mg Intravenous Daily   midodrine  10 mg Oral TID WC   pantoprazole (PROTONIX) IV  40 mg Intravenous Q12H   Ensure Max Protein  11 oz Oral Daily   rosuvastatin  10 mg Oral Daily    Objective: Vital signs in last 24 hours: Patient Vitals for the past 24 hrs:  BP Pulse Resp SpO2  06/27/21 1700 (!) 125/51 70 17 98 %  06/27/21 1300 135/63 70 (!) 21 95 %  06/27/21 1030 (!) 137/54 70 (!) 25 99 %  06/27/21 1000 126/60 70 (!) 21 100 %  06/27/21 0930 (!) 133/56 70 17 99 %  06/27/21 0830 (!) 112/48 70 17 97 %  06/27/21 0530 (!) 106/45 70 17 99 %  06/27/21 0500 (!) 108/47 70 16 100 %  06/27/21 0430 (!) 106/50 70 16 100 %  06/27/21 0300 (!) 113/55 70 17 100 %  06/27/21 0230 (!) 124/53 70 15 99 %  06/27/21 0200 (!) 114/50 70 13 98  %  06/26/21 2219 (!) 132/57 70 19 100 %     PHYSICAL EXAM:  General: Alert, cooperative, no distress, appears stated age.  Lungs: b/l air entry Heart: Regular rate and rhythm, no murmur, rub or gallop.paced rhythm Abdomen: Soft, non-tender,not distended. Bowel sounds normal. No masses Extremities: atraumatic, no cyanosis. No edema. No clubbing Skin: No rashes or lesions. Or bruising Lymph: Cervical, supraclavicular normal. Neurologic: Grossly non-focal  Lab Results Recent Labs    06/25/21 2243 06/26/21 0847 06/27/21 0828 06/27/21 1749  WBC 22.8* 14.3*  --   --   HGB 8.2* 8.2*  --   --   HCT 25.3* 25.8*  --   --   NA 124* 124* 129* 131*  K 5.1 5.0 4.5 4.5  CL 98 95* 95* 97*  CO2 15* 18* 24 25  BUN 83* 96* 90* 84*  CREATININE 3.21* 3.02* 2.05* 1.68*   Liver Panel Recent Labs    06/25/21 1350 06/26/21 0847  PROT 6.2* 6.6  ALBUMIN 2.2* 2.3*  AST 21 16  ALT 13 11  ALKPHOS 126 137*  BILITOT 3.0* 1.9*  BILIDIR 1.7*  --   IBILI 1.3*  --    Sedimentation Rate No results for input(s):  ESRSEDRATE in the last 72 hours. C-Reactive Protein No results for input(s): CRP in the last 72 hours.  Microbiology:  Studies/Results: ECHOCARDIOGRAM COMPLETE  Result Date: 06/27/2021    ECHOCARDIOGRAM REPORT   Patient Name:   Debra Saunders Date of Exam: 06/26/2021 Medical Rec #:  [id number]        Height:       67.0 in Accession #:    [id number]       Weight:       192.0 lb Date of Birth:  02-09-1946         BSA:          1.988 m Patient Age:    25 years         BP:           139/44 mmHg Patient Gender: F                HR:           87 bpm. Exam Location:  [facility] Procedure: 2D Echo, Cardiac Doppler and Color Doppler Indications:     R78.81 Bacteremia  History:         Patient has prior history of Echocardiogram examinations, most                  recent 09/12/2019. Cardiomyopathy, Stroke, Arrythmias:Atrial                  Fibrillation; Risk Factors:Hypertension, Dyslipidemia, Diabetes                   and Sleep Apnea.  Sonographer:     Cresenciano Lick RDCS Referring Phys:  [id number] Barb Merino Diagnosing Phys: Ida Rogue MD IMPRESSIONS  1. Unable to exclude valve endocarditis as detailed below.  2. Left ventricular ejection fraction, by estimation, is 60 to 65%. The left ventricle has normal function. The left ventricle has no regional wall motion abnormalities. There is mild left ventricular hypertrophy. Left ventricular diastolic parameters are indeterminate.  3. Right ventricular systolic function is normal. The right ventricular size is normal. There is moderately elevated pulmonary artery systolic pressure. The estimated right ventricular systolic pressure is 49.4 mmHg.  4. The mitral valve is normal in structure. Mild to moderate mitral valve regurgitation. No evidence of mitral stenosis.  5. Tricuspid valve regurgitation is moderate. Thickening of the tricuspid leaflets, unable to exclude vegetation.  6. The aortic valve is normal in structure. There is moderate calcification of the aortic valve. There is moderate thickening of the aortic valve. Aortic valve regurgitation is not visualized. valve thickening appears worse compared to study in 2021, unable to exclude vegetation.  7. Aortic valve sclerosis/calcification is present, without any evidence of aortic stenosis.  8. The inferior vena cava is normal in size with greater than 50% respiratory variability, suggesting right atrial pressure of 3 mmHg. FINDINGS  Left Ventricle: Left ventricular ejection fraction, by estimation, is 60 to 65%. The left ventricle has normal function. The left ventricle has no regional wall motion abnormalities. The left ventricular internal cavity size was normal in size. There is  mild left ventricular hypertrophy. Left ventricular diastolic parameters are indeterminate. Right Ventricle: The right ventricular size is normal. No increase in right ventricular wall thickness. Right ventricular systolic  function is normal. There is moderately elevated pulmonary artery systolic pressure. The tricuspid regurgitant velocity is 3.41 m/s, and with an assumed right atrial pressure of 5 mmHg, the estimated right ventricular systolic pressure is 49.6 mmHg. Left  Atrium: Left atrial size was normal in size. Right Atrium: Right atrial size was normal in size. Pericardium: There is no evidence of pericardial effusion. Mitral Valve: The mitral valve is normal in structure. Mild to moderate mitral valve regurgitation. No evidence of mitral valve stenosis. Tricuspid Valve: The tricuspid valve is normal in structure. Tricuspid valve regurgitation is moderate . No evidence of tricuspid stenosis. Aortic Valve: The aortic valve is normal in structure. There is moderate calcification of the aortic valve. There is moderate thickening of the aortic valve. Aortic valve regurgitation is not visualized. Aortic valve sclerosis/calcification is present, without any evidence of aortic stenosis. Aortic valve mean gradient measures 10.5 mmHg. Aortic valve peak gradient measures 18.4 mmHg. Aortic valve area, by VTI measures 1.73 cm. Pulmonic Valve: The pulmonic valve was normal in structure. Pulmonic valve regurgitation is mild. No evidence of pulmonic stenosis. Aorta: The aortic root is normal in size and structure. Venous: The inferior vena cava is normal in size with greater than 50% respiratory variability, suggesting right atrial pressure of 3 mmHg. IAS/Shunts: No atrial level shunt detected by color flow Doppler. Additional Comments: A device lead is visualized.  LEFT VENTRICLE PLAX 2D LVIDd:         5.00 cm   Diastology LVIDs:         2.80 cm   LV e' medial:    9.25 cm/s LV PW:         1.20 cm   LV E/e' medial:  14.9 LV IVS:        1.20 cm   LV e' lateral:   7.83 cm/s LVOT diam:     1.90 cm   LV E/e' lateral: 17.6 LV SV:         62 LV SV Index:   31 LVOT Area:     2.84 cm  RIGHT VENTRICLE             IVC RV Basal diam:  3.80 cm     IVC  diam: 1.80 cm RV S prime:     18.47 cm/s TAPSE (M-mode): 2.2 cm LEFT ATRIUM             Index        RIGHT ATRIUM           Index LA diam:        4.00 cm 2.01 cm/m   RA Area:     17.40 cm LA Vol (A2C):   75.5 ml 37.99 ml/m  RA Volume:   48.20 ml  24.25 ml/m LA Vol (A4C):   79.3 ml 39.90 ml/m LA Biplane Vol: 78.5 ml 39.50 ml/m  AORTIC VALVE AV Area (Vmax):    1.71 cm AV Area (Vmean):   1.72 cm AV Area (VTI):     1.73 cm AV Vmax:           214.50 cm/s AV Vmean:          152.250 cm/s AV VTI:            0.356 m AV Peak Grad:      18.4 mmHg AV Mean Grad:      10.5 mmHg LVOT Vmax:         129.00 cm/s LVOT Vmean:        92.350 cm/s LVOT VTI:          0.217 m LVOT/AV VTI ratio: 0.61  AORTA Ao Root diam: 2.80 cm Ao Asc diam:  3.50 cm MV E velocity: 137.67 cm/s  TRICUSPID VALVE                             TR Peak grad:   46.5 mmHg                             TR Vmax:        341.00 cm/s                              SHUNTS                             Systemic VTI:  0.22 m                             Systemic Diam: 1.90 cm Ida Rogue MD Electronically signed by Ida Rogue MD Signature Date/Time: 06/27/2021/10:07:07 AM    Final    US Abdomen Limited RUQ (LIVER/GB)  Result Date: 06/26/2021 CLINICAL DATA:  Right upper quadrant pain. EXAM: ULTRASOUND ABDOMEN LIMITED RIGHT UPPER QUADRANT COMPARISON:  CT abdomen/pelvis 06/24/2021. FINDINGS: Gallbladder: Cholecystectomy. Common bile duct: Diameter: 5 mm, within normal limits. Liver: No focal lesion identified. Within normal limits in parenchymal echogenicity. Portal vein is patent on color Doppler imaging with normal direction of blood flow towards the liver. IMPRESSION: Cholecystectomy.  No evidence of acute abnormality. Electronically Signed   By: Margaretha Sheffield M.D.   On: 06/26/2021 09:28     Assessment/Plan: Streptococcus  Group G bacteremia with sepsis - source currently unclear Because of pacemaker she will need TEE - also 2 d echo showed thickened  valves on ceftriaxone   Acute on chronic resp failure- has ILD/COPD/OSA Azithromycin not needed now that we know she has strep bacteremia Could have chf VS pneumonia due to sterp   Afib on eliquis and digoxin   AKI on CKD   Anemia' Seen by GI for one dark stool- no endoscopy for now Chronic diarrhea for many years - takes cholestyramine- cdiff neg   Thrombocytopenia -infection    Hyponatremia ?  Discussed the management with patient and her nurse

## 2021-06-27 NOTE — ED Notes (Signed)
Pt boosted in bed, set up for breakfast.

## 2021-06-27 NOTE — Progress Notes (Signed)
NAME:  Debra Saunders, MRN:  [id number], DOB:  [birth year], LOS: 2 ADMISSION DATE:  06/25/2021, CONSULTATION DATE:  06/25/21  REFERRING MD:  Hospitalist, CHIEF COMPLAINT:  shortness of breath, fever, chills, abdominal pain  History of Present Illness:  76yo female w/ a past medical history of HTN, HLD, Type II Diabetes, depression, subdural hematoma 2014, SA Node dysfunction s/p pacemaker, OSA, PAH, interstitial lung disease on 4L home O2, diastolic heart failure, atrial fibrillation on Eliquis, CKD Stage IIIa, cardiorenal syndrome, anemia and DVT who presented to ED this morning with shortness of breath, cough, fever, chills, burning on urination and dysuria. Patient also reportedly had one dark stool yesterday.   Patient was initially hypotensive on arrival to the ED, but improved after 2L of crystalloid. She was hypoxic on 4L oxygen w/ respiratory distress and placed on BIPAP.   ED workup revealed: WBC 20, Hgb 9-->7, lactate 1, troponin 276, +UA, COVID - and Flu -, CT abdomen/pelvis negative for acute findings, BC +for gram positive cocci and BCID detected streptococcus species.   Patient was started on Vancomycin and Cefepime and then transitioned to rocephin when blood cultures resulted. Initially improved with IVFs but then became hypotensive again requiring initiation of Levophed gtt and received steroids. Critical care was consulted.  Patient seen in ED. She is lethargic but arousable on BIPAP. VSS on levophed at 64mcg. Daughter at bedside.   06/26/20- patient is improved, weaned down to 4L [place], she is now speaking and is only on 1mc levophed. Reviewed medical plan with RN and RT today.   06/27/20- patietn is now on nasal canula without BIPAP requirement , vasopressors have been weaned off. We will sign off from PCCM and available as needed.   Pertinent  Medical History  Atrial Fibrillation DVT Cardiorenal syndrome Combined systolic and diastolic heart failure EF 55-60% Chronic respiratory  failure on 4L [place] at home COPD Prior ICH HLD HTN ILD NICM Lymphedema Pulmonary Hypertension Sleep Apnea Type II Diabetes  Significant Hospital Events: Including procedures, antibiotic start and stop dates in addition to other pertinent events   1/3 Admitted to ICU  Interim History / Subjective:  76yo female with multiple comorbidities admitted with septic shock 2/2 UTI and bacteremia with a GI Bleed on 06/25/21.   Objective   Blood pressure 135/63, pulse 70, temperature 98 F (36.7 C), temperature source Axillary, resp. rate (!) 21, weight 87.1 kg, SpO2 95 %.        Intake/Output Summary (Last 24 hours) at 06/27/2021 1631 Last data filed at 06/27/2021 1048 Gross per 24 hour  Intake --  Output 1900 ml  Net -1900 ml    Filed Weights   06/25/21 0851  Weight: 87.1 kg    Examination: General: Lethargic but arousable. Ill appearing HENT: Pupils equal and reactive Lungs: Lung sounds diminished on BIPAP currently  Cardiovascular: Irregular rhythm Abdomen: Rounded, tender, active bowel sounds Extremities: Warm, no deformities Neuro: Lethargic but easily arousable, VSS   Resolved Hospital Problem list   NA  Assessment & Plan:  Septic Shock 2/2 UTI/Bacteremia/Multifocal pneumonia --Blood cultures +GPC and BCID detected streptococcus species --Urinalysis + for UTI, culture pending --Initially on Vanc/Cefepime and narrowed to Rocephin --Lactate 1 --Continue Levophed for MAP > 65 --CT Abdomen/Pelvis negative for any acute findings --Received 2L Crystalloid in ED  GI Bleed --Reported one dark stool yesterday --Hgb dropped from 9-->7 --Receiving 1 unit of PRBC now --Started Protonix 40mg  IV BID --GI to see in AM  Acute on Chronic Respiratory  Failure ILD on 4L chronic O2 COPD OSA on CPAP --Patient with acute onset of respiratory distress in ED --Concern for multifocal pneumonia on CXR --Responded well to BIPAP --ABG this afternoon: 7.26/38/118/17 on  05/29/39% --Will trial off and then put back on overnight --Continue Rocephin and Azithromycin --PRN Duonebs  Pulmonary Hypertension --Home medications: Sidenafil, Macitentan and Selexigap --Holding due to hypotension  Non-Anion gap metabolic acidosis --pH 6.83 w/bicarb of 17 --Etiology unclear, Possibly from diarrhea vs worsening renal failure --Monitor closely  Atrial Fibrillation --Home medications: Digoxin and Eliquis --Holding due to GI Bleed and worsening renal function  Chronic Diastolic heart failure --2D echo on 09/12/2019 showed EF 60 to 65%.  BNP is elevated 344  Acute on Chronic renal failure --Baseline Stage IIIa renal failure --Cr 3.11 on admission --Suspect 2/2 UTI and dehydration --Received 2L of crystalloid  DVT --Holding Eliquis --Patient does have an IVC filter  Hyponatremia --Na 128-->126 --Suspect 2/2 Dehydration given diarrhea and poor appetite --Received 2L LR in ED --BMP q8hr  Chronic LLE lymphedema --Holding lasix due to above  Best Practice (right click and "Reselect all SmartList Selections" daily)   Diet/type: NPO DVT prophylaxis: other GI prophylaxis: H2B Lines: N/A Foley:  N/A Code Status:  full code Last date of multidisciplinary goals of care discussion [Daughter updated at bedside]  Labs   CBC: Recent Labs  Lab 06/24/21 1456 06/25/21 1350 06/25/21 2243 06/26/21 0847  WBC 20.1* 18.3* 22.8* 14.3*  HGB 8.1* 7.0* 8.2* 8.2*  HCT 25.0* 22.0* 25.3* 25.8*  MCV 85.0 85.9 85.5 84.3  PLT 99* 75* 87* 93*     Basic Metabolic Panel: Recent Labs  Lab 06/24/21 1456 06/25/21 1350 06/25/21 2243 06/26/21 0847 06/27/21 0828  NA 128* 126* 124* 124* 129*  K 4.2 4.9 5.1 5.0 4.5  CL 101 98 98 95* 95*  CO2 19* 20* 15* 18* 24  GLUCOSE 122* 171* 201* 224* 227*  BUN 57* 74* 83* 96* 90*  CREATININE 2.27* 3.11* 3.21* 3.02* 2.05*  CALCIUM 8.2* 7.9* 8.0* 7.7* 7.2*    GFR: Estimated Creatinine Clearance: 26 mL/min (A) (by C-G formula  based on SCr of 2.05 mg/dL (H)). Recent Labs  Lab 06/24/21 1456 06/25/21 0347 06/25/21 0433 06/25/21 1350 06/25/21 2243 06/26/21 0847 06/27/21 0828  PROCALCITON  --  18.79  --   --   --  12.96 5.80  WBC 20.1*  --   --  18.3* 22.8* 14.3*  --   LATICACIDVEN  --   --  1.0 1.0  --   --   --      Liver Function Tests: Recent Labs  Lab 06/24/21 1456 06/25/21 1350 06/26/21 0847  AST 19 21 16   ALT 12 13 11   ALKPHOS 128* 126 137*  BILITOT 2.7* 3.0* 1.9*  PROT 6.3* 6.2* 6.6  ALBUMIN 2.5* 2.2* 2.3*    Recent Labs  Lab 06/24/21 1456  LIPASE 45    No results for input(s): AMMONIA in the last 168 hours.  ABG    Component Value Date/Time   PHART 7.26 (L) 06/25/2021 1607   PCO2ART 38 06/25/2021 1607   PO2ART 118 (H) 06/25/2021 1607   HCO3 17.1 (L) 06/25/2021 1607   TCO2 33 (H) 08/30/2020 1118   ACIDBASEDEF 9.2 (H) 06/25/2021 1607   O2SAT 98.0 06/25/2021 1607      Coagulation Profile: Recent Labs  Lab 06/25/21 0258  INR 2.1*     Cardiac Enzymes: No results for input(s): CKTOTAL, CKMB, CKMBINDEX, TROPONINI in the last  168 hours.  HbA1C: Hemoglobin A1C  Date/Time Value Ref Range Status  02/03/2013 04:41 AM 6.2 4.2 - 6.3 % Final    Comment:    The American Diabetes Association recommends that a primary goal of therapy should be <7% and that physicians should reevaluate the treatment regimen in patients with HbA1c values consistently >8%.   01/12/2013 04:35 AM 7.2 (H) 4.2 - 6.3 % Final    Comment:    The American Diabetes Association recommends that a primary goal of therapy should be <7% and that physicians should reevaluate the treatment regimen in patients with HbA1c values consistently >8%.    Hgb A1c MFr Bld  Date/Time Value Ref Range Status  06/25/2021 03:47 AM 5.4 4.8 - 5.6 % Final    Comment:    (NOTE) Pre diabetes:          5.7%-6.4%  Diabetes:              >6.4%  Glycemic control for   <7.0% adults with diabetes   04/01/2021 02:16 PM 5.6  4.6 - 6.5 % Final    Comment:    Glycemic Control Guidelines for People with Diabetes:Non Diabetic:  <6%Goal of Therapy: <7%Additional Action Suggested:  >8%     CBG: Recent Labs  Lab 06/26/21 0747 06/27/21 0728  GLUCAP 212* 218*     Review of Systems:   Unable to assess due to condition  Past Medical History:  She,  has a past medical history of Arthritis, Atrial fibrillation, persistent ([place]), Cardiorenal syndrome, Chest pain, Chronic back pain, Chronic combined systolic (congestive) and diastolic (congestive) heart failure (HCC), Chronic respiratory failure ([place]), Gastritis, History of intracranial hemorrhage, Hyperlipidemia, Hypertension, Interstitial lung disease ([place]), Lipoma of back, Lymphedema, NICM (nonischemic cardiomyopathy) ([place]), Obesity, PAH (pulmonary artery hypertension) ([place]), Presence of permanent cardiac pacemaker (01/09/2014), Sepsis with metabolic encephalopathy ([place]), Sleep apnea, Streptococcal bacteremia, Stroke ([place]) (2014), Subarachnoid hemorrhage ([place]) (01/19/2013), Symptomatic bradycardia, Thyroid nodule, Type II diabetes mellitus ([place]), and Urinary incontinence.   Surgical History:   Past Surgical History:  Procedure Laterality Date   ABDOMINAL HYSTERECTOMY  1969   BREAST BIOPSY Right 10/28/2019   lymph node bx, [place] Bx, pending path    CARDIAC CATHETERIZATION  2013   @ [facility]: No obstructive CAD: Only 20% ostial left circumflex.   CARDIOVERSION N/A 10/12/2017   Procedure: CARDIOVERSION;  Surgeon: Minna Merritts, MD;  Location: [facility];  Service: Cardiovascular;  Laterality: N/A;   CATARACT EXTRACTION W/PHACO Right 09/19/2020   Procedure: CATARACT EXTRACTION PHACO AND INTRAOCULAR LENS PLACEMENT (IOC) RIGHT DIABETIC 3.95 00:43.0 9.2%;  Surgeon: Leandrew Koyanagi, MD;  Location: [place];  Service: Ophthalmology;  Laterality: Right;  Pt requests to be last sleep apnea   CATARACT EXTRACTION W/PHACO Left 10/03/2020   Procedure: CATARACT EXTRACTION  PHACO AND INTRAOCULAR LENS PLACEMENT ([place]) LEFT DIABETIC;  Surgeon: Leandrew Koyanagi, MD;  Location: [facility];  Service: Ophthalmology;  Laterality: Left;  3.25 0:59.3 5.5%   CHOLECYSTECTOMY     COLONOSCOPY WITH PROPOFOL N/A 09/10/2018   Procedure: COLONOSCOPY WITH PROPOFOL;  Surgeon: Manya Silvas, MD;  Location: [facility] ENDOSCOPY;  Service: Endoscopy;  Laterality: N/A;   CYST REMOVAL TRUNK  2002   BACK   DIALYSIS/PERMA CATHETER INSERTION N/A 10/06/2017   Procedure: DIALYSIS/PERMA CATHETER INSERTION;  Surgeon: Katha Cabal, MD;  Location: [place] [facility];  Service: Cardiovascular;  Laterality: N/A;   INSERT / REPLACE / REMOVE PACEMAKER     lipoma removal  2014  back   PACEMAKER INSERTION  2015   Williams dual chamber pacemaker implanted by Dr Caryl Comes for symptomatic bradycardia   PERMANENT PACEMAKER INSERTION N/A 01/09/2014   Procedure: PERMANENT PACEMAKER INSERTION;  Surgeon: Deboraha Sprang, MD;  Location: [facility] LAB;  Service: Cardiovascular;  Laterality: N/A;   RIGHT HEART CATH N/A 04/19/2018   Procedure: RIGHT HEART CATH;  Surgeon: Larey Dresser, MD;  Location: [place] [facility];  Service: Cardiovascular;  Laterality: N/A;   RIGHT HEART CATH N/A 08/30/2020   Procedure: RIGHT HEART CATH;  Surgeon: Larey Dresser, MD;  Location: [place] [facility];  Service: Cardiovascular;  Laterality: N/A;   RIGHT/LEFT HEART CATH AND CORONARY ANGIOGRAPHY N/A 10/19/2017   Procedure: RIGHT/LEFT HEART CATH AND CORONARY ANGIOGRAPHY;  Surgeon: Wellington Hampshire, MD;  Location: [place] [facility];  Service: Cardiovascular;  Laterality: N/A;   TEE WITHOUT CARDIOVERSION N/A 08/20/2017   Procedure: TRANSESOPHAGEAL ECHOCARDIOGRAM (TEE) & Direct current cardioversion;  Surgeon: Minna Merritts, MD;  Location: [facility] OR[facility];  Service: Cardiovascular;  Laterality: N/A;   TRIGGER FINGER RELEASE       Social History:   reports that she quit smoking about 31 years ago. Her smoking  use included cigarettes. She has a 0.60 pack-year smoking history. She has never used smokeless tobacco. She reports that she does not drink alcohol and does not use drugs.   Family History:  Her family history includes Bone cancer in her sister; Breast cancer in her maternal aunt and sister; COPD in her mother; Heart disease in her mother; Hypertension in her father, mother, sister, and sister; Thyroid disease in her mother, sister, and sister. There is no history of Kidney disease or Bladder Cancer.   Allergies Allergies  Allergen Reactions   Aspirin Hives and Other (See Comments)    Pt states that she is unable to take because she had bleeding in her brain   Other Other (See Comments)    Pt states that she is unable to take blood thinners because she had bleeding in her brain; Blood Thinners-per doctor at [facility] (Xarelto)   Penicillins Hives, Shortness Of Breath, Swelling and Other (See Comments)    Has patient had a PCN reaction causing immediate rash, facial/tongue/throat swelling, SOB or lightheadedness with hypotension: Yes Has patient had a PCN reaction causing severe rash involving mucus membranes or skin necrosis: No Has patient had a PCN reaction that required hospitalization No Has patient had a PCN reaction occurring within the last 10 years: No If all of the above answers are "NO", then may proceed with Cephalosporin use.     Home Medications  Prior to Admission medications   Medication Sig Start Date End Date Taking? Authorizing Provider  acetaminophen (TYLENOL) 325 MG tablet Take 325-650 mg by mouth every 6 (six) hours as needed for mild pain.   Yes [provider]  albuterol (ACCUNEB) 0.63 MG/3ML nebulizer solution Take 1 ampule by nebulization every 6 (six) hours as needed for wheezing.   Yes [provider]  apixaban (ELIQUIS) 5 MG TABS tablet TAKE 1 TABLET BY MOUTH TWICE DAILY . APPOINTMENT REQUIRED FOR FUTURE REFILLS 03/22/21  Yes Wellington Hampshire,  MD  cholestyramine Lucrezia Starch) 4 GM/DOSE powder Take 4 g by mouth daily. 08/03/20  Yes [provider]  Difluprednate 0.05 % EMUL Place 1 drop into both eyes 2 (two) times daily.   Yes [provider]  digoxin (LANOXIN) 0.125 MG tablet Take 1 tablet (125 mcg total)  by mouth daily. 09/03/20  Yes Larey Dresser, MD  DULoxetine (CYMBALTA) 30 MG capsule TAKE 1 CAPSULE BY MOUTH ONCE DAILY FOR 14 DAYS, THEN INCREASE TO 2 CAPSULES ONCE DAILY Patient taking differently: Take 60 mg by mouth daily. 06/06/21  Yes [name], MD  gabapentin (NEURONTIN) 300 MG capsule Take 600 mg by mouth 3 (three) times daily.   Yes [provider]  KLOR-CON M20 20 MEQ tablet Take 1 tablet by mouth once daily 09/24/20  Yes Larey Dresser, MD  Lancets MISC Use with True Metrix glucometer to check sugars BID 11/14/19  Yes [name], MD  macitentan (OPSUMIT) 10 MG tablet Take 1 tablet (10 mg total) by mouth daily. 01/24/21  Yes Larey Dresser, MD  meclizine (ANTIVERT) 25 MG tablet Take 1 tablet (25 mg total) by mouth 3 (three) times daily as needed for dizziness. 12/25/20  Yes Larey Dresser, MD  Menthol, Topical Analgesic, (BIOFREEZE EX) Apply 1 application topically 4 (four) times daily as needed (for pain).   Yes [provider]  rosuvastatin (CRESTOR) 10 MG tablet Take 1 tablet by mouth once daily 06/06/21  Yes Larey Dresser, MD  Selexipag (UPTRAVI) 1600 MCG TABS Take 1 tablet (1,600 mcg total) by mouth 2 (two) times daily. 04/19/21  Yes Larey Dresser, MD  sildenafil (REVATIO) 20 MG tablet Take 1 tablet (20 mg total) by mouth 3 (three) times daily. 01/03/21  Yes Larey Dresser, MD  spironolactone (ALDACTONE) 25 MG tablet TAKE 1 TABLET BY MOUTH AT BEDTIME.  PLEASE CALL FOR OFFICE VISIT [phone] 02/04/21  Yes Larey Dresser, MD  torsemide (DEMADEX) 20 MG tablet Take 40 mg by mouth 2 (two) times daily.   Yes [provider]  TRUE METRIX BLOOD GLUCOSE TEST  test strip CHECK BLOOD GLUCOSE TWICE DAILY 11/21/20  Yes [name], MD  gabapentin (NEURONTIN) 300 MG capsule Take 1 capsule (300 mg total) by mouth every morning AND 1 capsule (300 mg total) daily after lunch AND 2 capsules (600 mg total) at bedtime. Patient not taking: Reported on 06/25/2021 05/24/21   [name], MD  ondansetron (ZOFRAN) 8 MG tablet Take 1 tablet (8 mg total) by mouth every 8 (eight) hours as needed for nausea or vomiting. Patient not taking: Reported on 06/25/2021 09/25/14   Jackolyn Confer, MD    Critical care provider statement:   Total critical care time: 33 minutes   Performed by: Lanney Gins MD   Critical care time was exclusive of separately billable procedures and treating other patients.   Critical care was necessary to treat or prevent imminent or life-threatening deterioration.   Critical care was time spent personally by me on the following activities: development of treatment plan with patient and/or surrogate as well as nursing, discussions with consultants, evaluation of patient's response to treatment, examination of patient, obtaining history from patient or surrogate, ordering and performing treatments and interventions, ordering and review of laboratory studies, ordering and review of radiographic studies, pulse oximetry and re-evaluation of patient's condition.    Ottie Glazier, M.D.  Pulmonary & Critical Care Medicine

## 2021-06-27 NOTE — ED Notes (Addendum)
Pt states she is not hungry, pt drank orange juice, not eating breakfast.

## 2021-06-27 NOTE — ED Notes (Signed)
Pt tolerating O2 4 L [place] well, sats remain at 100% on 4 L [place]

## 2021-06-28 ENCOUNTER — Encounter
Admission: EM | Disposition: A | Discharge status: Other Acute Inpatient Hospital | Payer: Self-pay | Source: Home / Self Care | Attending: Internal Medicine

## 2021-06-28 ENCOUNTER — Encounter: Payer: Self-pay | Admitting: Cardiovascular Disease

## 2021-06-28 ENCOUNTER — Inpatient Hospital Stay (HOSPITAL_COMMUNITY)
Admit: 2021-06-28 | Discharge: 2021-06-28 | Disposition: A | Discharge status: Home / Self Care | Payer: Medicare HMO | Attending: Physician Assistant | Admitting: Physician Assistant

## 2021-06-28 DIAGNOSIS — T827XXA Infection and inflammatory reaction due to other cardiac and vascular devices, implants and grafts, initial encounter: Principal | ICD-10-CM

## 2021-06-28 DIAGNOSIS — R7881 Bacteremia: Secondary | ICD-10-CM

## 2021-06-28 DIAGNOSIS — A419 Sepsis, unspecified organism: Secondary | ICD-10-CM | POA: Diagnosis present

## 2021-06-28 DIAGNOSIS — B955 Unspecified streptococcus as the cause of diseases classified elsewhere: Secondary | ICD-10-CM | POA: Diagnosis not present

## 2021-06-28 DIAGNOSIS — R6521 Severe sepsis with septic shock: Secondary | ICD-10-CM | POA: Diagnosis not present

## 2021-06-28 DIAGNOSIS — I340 Nonrheumatic mitral (valve) insufficiency: Secondary | ICD-10-CM

## 2021-06-28 HISTORY — PX: TEE WITHOUT CARDIOVERSION: SHX5443

## 2021-06-28 LAB — CBG MONITORING, ED
Glucose-Capillary: 203 mg/dL — ABNORMAL HIGH (ref 70–99)
Glucose-Capillary: 190 mg/dL — ABNORMAL HIGH (ref 70–99)

## 2021-06-28 LAB — BASIC METABOLIC PANEL
Anion gap: 4 — ABNORMAL LOW (ref 5–15)
BUN: 76 mg/dL — ABNORMAL HIGH (ref 8–23)
CO2: 30 mmol/L (ref 22–32)
Calcium: 7.5 mg/dL — ABNORMAL LOW (ref 8.9–10.3)
Chloride: 100 mmol/L (ref 98–111)
Creatinine, Ser: 1.46 mg/dL — ABNORMAL HIGH (ref 0.44–1.00)
GFR, Estimated: 37 mL/min — ABNORMAL LOW (ref >60)
Glucose, Bld: 242 mg/dL — ABNORMAL HIGH (ref 70–99)
Potassium: 3.8 mmol/L (ref 3.5–5.1)
Sodium: 134 mmol/L — ABNORMAL LOW (ref 135–145)

## 2021-06-28 LAB — CULTURE, BLOOD (ROUTINE X 2): Special Requests: ADEQUATE

## 2021-06-28 LAB — CBC WITH DIFFERENTIAL/PLATELET
Abs Immature Granulocytes: 0.46 10*3/uL — ABNORMAL HIGH (ref 0.00–0.07)
Basophils Absolute: 0 10*3/uL (ref 0.0–0.1)
Basophils Relative: 0 %
Eosinophils Absolute: 0 10*3/uL (ref 0.0–0.5)
Eosinophils Relative: 0 %
HCT: 23.4 % — ABNORMAL LOW (ref 36.0–46.0)
Hemoglobin: 7.7 g/dL — ABNORMAL LOW (ref 12.0–15.0)
Immature Granulocytes: 5 %
Lymphocytes Relative: 9 %
Lymphs Abs: 0.9 10*3/uL (ref 0.7–4.0)
MCH: 27.6 pg (ref 26.0–34.0)
MCHC: 32.9 g/dL (ref 30.0–36.0)
MCV: 83.9 fL (ref 80.0–100.0)
Monocytes Absolute: 0.7 10*3/uL (ref 0.1–1.0)
Monocytes Relative: 8 %
Neutro Abs: 7.3 10*3/uL (ref 1.7–7.7)
Neutrophils Relative %: 78 %
Platelets: 118 10*3/uL — ABNORMAL LOW (ref 150–400)
RBC: 2.79 MIL/uL — ABNORMAL LOW (ref 3.87–5.11)
RDW: 14.7 % (ref 11.5–15.5)
WBC: 9.3 10*3/uL (ref 4.0–10.5)
nRBC: 0 % (ref 0.0–0.2)

## 2021-06-28 LAB — LEGIONELLA PNEUMOPHILA SEROGP 1 UR AG: L. pneumophila Serogp 1 Ur Ag: NEGATIVE

## 2021-06-28 LAB — HEPATIC FUNCTION PANEL
ALT: 12 U/L (ref 0–44)
AST: 13 U/L — ABNORMAL LOW (ref 15–41)
Albumin: 2.4 g/dL — ABNORMAL LOW (ref 3.5–5.0)
Alkaline Phosphatase: 123 U/L (ref 38–126)
Bilirubin, Direct: 0.3 mg/dL — ABNORMAL HIGH (ref 0.0–0.2)
Indirect Bilirubin: 0.7 mg/dL (ref 0.3–0.9)
Total Bilirubin: 1 mg/dL (ref 0.3–1.2)
Total Protein: 6.3 g/dL — ABNORMAL LOW (ref 6.5–8.1)

## 2021-06-28 SURGERY — ECHOCARDIOGRAM, TRANSESOPHAGEAL
Anesthesia: Moderate Sedation

## 2021-06-28 MED ORDER — LORAZEPAM 2 MG/ML IJ SOLN: INTRAMUSCULAR | Status: AC | PRN

## 2021-06-28 MED ORDER — LIDOCAINE VISCOUS HCL 2 % MT SOLN
OROMUCOSAL | Status: AC
  Filled 2021-06-28: qty 15

## 2021-06-28 MED ORDER — FENTANYL CITRATE (PF) 100 MCG/2ML IJ SOLN
INTRAMUSCULAR | Status: AC | PRN
  Administered 2021-06-28: 50 ug via INTRAVENOUS
  Administered 2021-06-28 (×2): 25 ug via INTRAVENOUS

## 2021-06-28 MED ORDER — MIDAZOLAM HCL 2 MG/2ML IJ SOLN
INTRAMUSCULAR | Status: AC | PRN
Start: 2021-06-28 — End: 2021-06-28
  Administered 2021-06-28: 2 mg via INTRAVENOUS
  Administered 2021-06-28 (×2): 1 mg via INTRAVENOUS

## 2021-06-28 MED ORDER — SODIUM CHLORIDE 0.9 % IV SOLN: INTRAVENOUS | Status: DC

## 2021-06-28 MED ORDER — MORPHINE SULFATE (PF) 2 MG/ML IV SOLN
INTRAVENOUS | Status: AC
  Filled 2021-06-28: qty 1

## 2021-06-28 MED ORDER — SODIUM CHLORIDE FLUSH 0.9 % IV SOLN
INTRAVENOUS | Status: AC
  Filled 2021-06-28: qty 10

## 2021-06-28 MED ORDER — BUTAMBEN-TETRACAINE-BENZOCAINE 2-2-14 % EX AERO
INHALATION_SPRAY | CUTANEOUS | Status: AC
  Filled 2021-06-28: qty 5

## 2021-06-28 MED ORDER — FENTANYL CITRATE (PF) 100 MCG/2ML IJ SOLN
INTRAMUSCULAR | Status: AC
  Filled 2021-06-28: qty 2

## 2021-06-28 MED ORDER — MIDAZOLAM HCL 2 MG/2ML IJ SOLN
INTRAMUSCULAR | Status: AC
  Filled 2021-06-28: qty 4

## 2021-06-28 NOTE — Progress Notes (Signed)
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 fib on Eliquis, CKD stage IIIa, cardiorenal syndrome, DVT on Eliquis presented to the ER with shortness of breath, cough fever and chills, dysuria and right upper quadrant abdominal pain for almost 3 weeks. In the emergency room initially hypotensive with blood pressure 73/26 that improved to 103/2:54 liters Ringer lactate bolus.  She was 87% on 4 L home oxygen.  Started on BiPAP for respiratory distress.  WBC count 20,000.  Hemoglobin 8.1-7.  COVID-19 negative.  INR 2.  FOBT negative.  Lactic acid 1.  Abnormal urinalysis.  Sodium 128-124.  Temperature 100.1.  Chest x-ray with multifocal infiltrate.  CT scan abdomen pelvis negative for any acute intra-abdominal issues.  Blood cultures subsequently growing gram-positive cocci with group G Streptococcus.   Assessment & Plan:   Principal Problem:   Severe sepsis with septic shock (HCC) Active Problems:   Pulmonary hypertension, unspecified (HCC)   Acute on chronic respiratory failure with hypoxia (HCC)   Normocytic anemia   Interstitial lung disease (HCC)   UTI (urinary tract infection)   Atrial fibrillation (HCC)   Multifocal pneumonia   HTN (hypertension)   HLD (hyperlipidemia)   Chronic diastolic CHF (congestive heart failure) (HCC)   Acute renal failure superimposed on stage 3a chronic kidney disease (HCC)   DVT (deep venous thrombosis) (HCC)   Hyponatremia   Thrombocytopenia (HCC)   Diarrhea   Bacteremia due to Gram-positive bacteria   GI bleeding   Black stool   Septic shock (HCC)  Severe sepsis with septic shock secondary to multifocal  pneumonia.  UTI and bacteremia due to gram-positive bacteria: Streptococcal bacteremia. Blood pressures stabilized.  Renal functions improving.  Taper off of steroids. Initially on vancomycin and cefepime, now changed to Rocephin .  Blood cultures 1/3, Streptococcus Blood cultures 1/5, no growth so far Patient has a pacemaker, rule out infection.  TTE not diagnostic.  Scheduled for TEE today. Bronchodilators.  Chest physiotherapy.  Follow-up blood cultures, sputum cultures. Followed by infectious disease.  Acute on chronic hypoxemic respiratory failure due to multifocal pneumonia:  4 L oxygen in the daytime.  BiPAP at night.  At her baseline.  Pulmonary hypertension: Uses sildenafil, macitentan. On hold due to hypotension.  Continue to hold.  Acute anemia: Suspected acute blood loss anemia. Baseline hemoglobin 9-presented with hemoglobin 8-dropped to 7.  1 unit PRBC given. Hemoglobin 8.1-7.7.  Patient anticoagulated with Eliquis.  Holding Eliquis.  IV Protonix.  Avoid NSAIDs. Close monitoring of hemoglobin. Seen by gastroenterology, not planning for any endoscopic evaluation. Planning conservative management. Hemoglobin is stable since initial drop.  Acute renal failure superimposed on stage IIIa chronic kidney disease/hyponatremia: Likely multifactorial including UTI, dehydration with continued sign of diuretics, ATN from hypotension.  Holding all diuretics.  On bicarbonate.  Followed by nephrology. Renal functions improving now.  Good urine output. Started on bicarbonate infusion by nephrology.    Essential hypertension: Blood pressure medications on hold.  Hyperlipidemia: On Crestor continued.  Klebsiella UTI present on admission: On Rocephin.    Sick sinus syndrome: On pacemaker.  Rule out vegetation.  For TEE today.  Abnormal LFTs: Suspect shock liver.  No evidence of bile  duct obstruction.  Improving.  Status postcholecystectomy.  Right upper quadrant ultrasound fairly stable  without any evidence of cholangitis. Liver function test improving.   DVT prophylaxis:   SCDs.   Code Status: Full code Family Communication: None. Disposition Plan: Status is: Inpatient  Remains inpatient appropriate because: Significant hemodynamic instability. Does not need to go to stepdown.  Can go to medical telemetry.        Consultants:  Critical care GI Nephrology Infectious disease  Procedures:  TEE scheduled for today.  Antimicrobials:  Rocephin 1/3---   Subjective:  Patient seen and examined.  Right upper quadrant abdominal pain still persist and less intensity.  She does get pain with deep breathing. CT scan and ultrasound with no evidence of cholangitis or bile duct abnormalities.  Probably pleuritic chest pain.  Will use a small dose of morphine.  Objective: Vitals:   06/28/21 0445 06/28/21 0500 06/28/21 0515 06/28/21 0530  BP:  (!) 112/42  (!) 108/55  Pulse: 75 70 72 69  Resp: 20 (!) 21 (!) 21 (!) 23  Temp:      TempSrc:      SpO2: 99% 99% 98% 99%  Weight:        Intake/Output Summary (Last 24 hours) at 06/28/2021 0851 Last data filed at 06/28/2021 0747 Gross per 24 hour  Intake 100 ml  Output 600 ml  Net -500 ml   Filed Weights   06/25/21 0851  Weight: 87.1 kg    Examination:  General exam: Appears calm and comfortable.  Slightly anxious. Frail and debilitated. Not in any distress.  Currently on 4 L oxygen. She is chronically sick looking.  Weaned off CPAP that she uses for sleep apnea. Respiratory system: Some conducted airway sounds.  No added sounds. Cardiovascular system: S1 & S2 heard, RRR.  Pacemaker in place.   Gastrointestinal system: Mildly tender right upper quadrant.  No rigidity or guarding.  Bowel sound present. Central nervous system: Alert and oriented. No focal neurological deficits. Extremities: Symmetric 5 x 5 power. Skin: No rashes, lesions or ulcers Psychiatry: Judgement and insight appear normal. Mood & affect  appropriate.  Chronic asymmetrical edema left lower extremity.  Nonpitting.    Data Reviewed: I have personally reviewed following labs and imaging studies  CBC: Recent Labs  Lab 06/24/21 1456 06/25/21 1350 06/25/21 2243 06/26/21 0847 06/28/21 0500  WBC 20.1* 18.3* 22.8* 14.3* 9.3  NEUTROABS  --   --   --   --  7.3  HGB 8.1* 7.0* 8.2* 8.2* 7.7*  HCT 25.0* 22.0* 25.3* 25.8* 23.4*  MCV 85.0 85.9 85.5 84.3 83.9  PLT 99* 75* 87* 93* 607*   Basic Metabolic Panel: Recent Labs  Lab 06/25/21 2243 06/26/21 0847 06/27/21 0828 06/27/21 1749 06/28/21 0500  NA 124* 124* 129* 131* 134*  K 5.1 5.0 4.5 4.5 3.8  CL 98 95* 95* 97* 100  CO2 15* 18* 24 25 30   GLUCOSE 201* 224* 227* 257* 242*  BUN 83* 96* 90* 84* 76*  CREATININE 3.21* 3.02* 2.05* 1.68* 1.46*  CALCIUM 8.0* 7.7* 7.2* 7.3* 7.5*   GFR: Estimated Creatinine Clearance: 36.6 mL/min (A) (by C-G formula based on SCr of 1.46 mg/dL (H)). Liver Function Tests: Recent Labs  Lab 06/24/21 1456 06/25/21 1350 06/26/21 0847  AST 19 21 16   ALT 12 13 11   ALKPHOS 128* 126 137*  BILITOT 2.7* 3.0* 1.9*  PROT 6.3* 6.2* 6.6  ALBUMIN 2.5* 2.2* 2.3*   Recent Labs  Lab 06/24/21 1456  LIPASE 45   No results for input(s): AMMONIA in the last 168 hours. Coagulation Profile: Recent Labs  Lab 06/25/21 0258  INR 2.1*   Cardiac Enzymes: No results for input(s): CKTOTAL, CKMB, CKMBINDEX, TROPONINI in the last 168 hours. BNP (last 3 results) No results for input(s): PROBNP in the last 8760 hours. HbA1C: No results for input(s): HGBA1C in the last 72 hours.  CBG: Recent Labs  Lab 06/26/21 0747 06/27/21 0728 06/28/21 0747  GLUCAP 212* 218* 203*   Lipid Profile: Recent Labs    06/26/21 0847  CHOL 89  HDL <10*  LDLCALC NOT CALCULATED  TRIG 132  CHOLHDL NOT CALCULATED   Thyroid Function Tests: No results for input(s): TSH, T4TOTAL, FREET4, T3FREE, THYROIDAB in the last 72 hours. Anemia Panel: No results for input(s):  VITAMINB12, FOLATE, FERRITIN, TIBC, IRON, RETICCTPCT in the last 72 hours. Sepsis Labs: Recent Labs  Lab 06/25/21 0347 06/25/21 0433 06/25/21 1350 06/26/21 0847 06/27/21 0828  PROCALCITON 18.79  --   --  12.96 5.80  LATICACIDVEN  --  1.0 1.0  --   --     Recent Results (from the past 240 hour(s))  Blood culture (routine x 2)     Status: Abnormal (Preliminary result)   Collection Time: 06/25/21  2:33 AM   Specimen: BLOOD RIGHT FOREARM  Result Value Ref Range Status   Specimen Description   Final    BLOOD RIGHT FOREARM Performed at [facility], [address] [place], [place] [address]    Special Requests   Final    BOTTLES DRAWN AEROBIC AND ANAEROBIC Blood Culture results may not be optimal due to an inadequate volume of blood received in culture bottles Performed at [facility], [address] [place], [place] [address]    Culture  Setup Time   Final    GRAM POSITIVE COCCI IN BOTH AEROBIC AND ANAEROBIC BOTTLES Organism ID to follow CRITICAL RESULT CALLED TO, READ BACK BY AND VERIFIED WITHArdeen Garland [id number] 06/25/21 HNM Performed at [facility], [address] [place],  [address]    Culture (A)  Final    STREPTOCOCCUS GROUP G SUSCEPTIBILITIES TO FOLLOW Performed at [facility], [place] [address] [place],  [address]    Report Status PENDING  Incomplete  Blood Culture ID Panel (Reflexed)     Status: Abnormal   Collection Time: 06/25/21  2:33 AM  Result Value Ref Range Status   Enterococcus faecalis NOT DETECTED NOT DETECTED Final   Enterococcus Faecium NOT DETECTED NOT DETECTED Final   Listeria monocytogenes NOT DETECTED NOT DETECTED Final   Staphylococcus species NOT DETECTED NOT DETECTED Final   Staphylococcus aureus (BCID) NOT DETECTED NOT DETECTED Final   Staphylococcus epidermidis NOT DETECTED NOT DETECTED Final   Staphylococcus lugdunensis NOT DETECTED NOT DETECTED Final   Streptococcus species DETECTED  (A) NOT DETECTED Final    Comment: Not Enterococcus species, Streptococcus agalactiae, Streptococcus pyogenes, or Streptococcus pneumoniae. CRITICAL RESULT CALLED TO, READ BACK BY AND VERIFIED WITH: MORGAN HICKS [id number] 06/25/21 HNM    Streptococcus agalactiae NOT DETECTED NOT DETECTED Final   Streptococcus pneumoniae NOT DETECTED NOT DETECTED Final   Streptococcus pyogenes NOT DETECTED NOT DETECTED Final   A.calcoaceticus-baumannii NOT DETECTED NOT DETECTED Final   Bacteroides fragilis NOT DETECTED NOT DETECTED Final   Enterobacterales NOT DETECTED NOT DETECTED Final   Enterobacter cloacae complex NOT DETECTED NOT DETECTED Final   Escherichia coli NOT DETECTED NOT DETECTED Final   Klebsiella  aerogenes NOT DETECTED NOT DETECTED Final   Klebsiella oxytoca NOT DETECTED NOT DETECTED Final   Klebsiella pneumoniae NOT DETECTED NOT DETECTED Final   Proteus species NOT DETECTED NOT DETECTED Final   Salmonella species NOT DETECTED NOT DETECTED Final   Serratia marcescens NOT DETECTED NOT DETECTED Final   Haemophilus influenzae NOT DETECTED NOT DETECTED Final   Neisseria meningitidis NOT DETECTED NOT DETECTED Final   Pseudomonas aeruginosa NOT DETECTED NOT DETECTED Final   Stenotrophomonas maltophilia NOT DETECTED NOT DETECTED Final   Candida albicans NOT DETECTED NOT DETECTED Final   Candida auris NOT DETECTED NOT DETECTED Final   Candida glabrata NOT DETECTED NOT DETECTED Final   Candida krusei NOT DETECTED NOT DETECTED Final   Candida parapsilosis NOT DETECTED NOT DETECTED Final   Candida tropicalis NOT DETECTED NOT DETECTED Final   Cryptococcus neoformans/gattii NOT DETECTED NOT DETECTED Final    Comment: Performed at [facility], [address] [place], [place] [address]  Resp Panel by RT-PCR (Flu A&B, Covid) Nasopharyngeal Swab     Status: None   Collection Time: 06/25/21  3:33 AM   Specimen: Nasopharyngeal Swab; Nasopharyngeal(NP) swabs in vial transport medium  Result  Value Ref Range Status   SARS Coronavirus 2 by RT PCR NEGATIVE NEGATIVE Final    Comment: (NOTE) SARS-CoV-2 target nucleic acids are NOT DETECTED.  The SARS-CoV-2 RNA is generally detectable in upper respiratory specimens during the acute phase of infection. The lowest concentration of SARS-CoV-2 viral copies this assay can detect is 138 copies/mL. A negative result does not preclude SARS-Cov-2 infection and should not be used as the sole basis for treatment or other patient management decisions. A negative result may occur with  improper specimen collection/handling, submission of specimen other than nasopharyngeal swab, presence of viral mutation(s) within the areas targeted by this assay, and inadequate number of viral copies(<138 copies/mL). A negative result must be combined with clinical observations, patient history, and epidemiological information. The expected result is Negative.  Fact Sheet for Patients:  EntrepreneurPulse.com.au  Fact Sheet for Healthcare Providers:  IncredibleEmployment.be  This test is no t yet approved or cleared by the [place] FDA and  has been authorized for detection and/or diagnosis of SARS-CoV-2 by FDA under an Emergency Use Authorization (EUA). This EUA will remain  in effect (meaning this test can be used) for the duration of the COVID-19 declaration under Section 564(b)(1) of the Act, 21 U.S.C.section 360bbb-3(b)(1), unless the authorization is terminated  or revoked sooner.       Influenza A by PCR NEGATIVE NEGATIVE Final   Influenza B by PCR NEGATIVE NEGATIVE Final    Comment: (NOTE) The Xpert Xpress SARS-CoV-2/FLU/RSV plus assay is intended as an aid in the diagnosis of influenza from Nasopharyngeal swab specimens and should not be used as a sole basis for treatment. Nasal washings and aspirates are unacceptable for Xpert Xpress SARS-CoV-2/FLU/RSV testing.  Fact Sheet for  Patients: EntrepreneurPulse.com.au  Fact Sheet for Healthcare Providers: IncredibleEmployment.be  This test is not yet approved or cleared by the [place] FDA and has been authorized for detection and/or diagnosis of SARS-CoV-2 by FDA under an Emergency Use Authorization (EUA). This EUA will remain in effect (meaning this test can be used) for the duration of the COVID-19 declaration under Section 564(b)(1) of the Act, 21 U.S.C. section 360bbb-3(b)(1), unless the authorization is terminated or revoked.  Performed at [facility], [address], [place], [place] [address]   Blood culture (routine x 2)  Status: Abnormal (Preliminary result)   Collection Time: 06/25/21  5:55 AM   Specimen: BLOOD  Result Value Ref Range Status   Specimen Description   Final    BLOOD LEFT ARM Performed at [facility], [address], [place], [place] [address]    Special Requests   Final    BOTTLES DRAWN AEROBIC AND ANAEROBIC Blood Culture adequate volume Performed at [facility], [address] [place], [place] [address]    Culture  Setup Time   Final    GRAM POSITIVE COCCI IN BOTH AEROBIC AND ANAEROBIC BOTTLES CRITICAL VALUE NOTED.  VALUE IS CONSISTENT WITH PREVIOUSLY REPORTED AND CALLED VALUE. Performed at [facility], [address] [place], [place] [address]    Culture STREPTOCOCCUS GROUP G (A)  Final   Report Status PENDING  Incomplete  Urine Culture     Status: Abnormal   Collection Time: 06/25/21  6:55 AM   Specimen: Urine, Clean Catch  Result Value Ref Range Status   Specimen Description   Final    URINE, CLEAN CATCH Performed at [facility], [address], [place], [place] [address]    Special Requests   Final    NONE Performed at [facility], [place], [place] [address]    Culture >=100,000 COLONIES/mL KLEBSIELLA PNEUMONIAE (A)  Final   Report Status  06/27/2021 FINAL  Final   Organism ID, Bacteria KLEBSIELLA PNEUMONIAE (A)  Final      Susceptibility   Klebsiella pneumoniae - MIC*    AMPICILLIN RESISTANT Resistant     CEFAZOLIN <=4 SENSITIVE Sensitive     CEFEPIME <=0.12 SENSITIVE Sensitive     CEFTRIAXONE <=0.25 SENSITIVE Sensitive     CIPROFLOXACIN <=0.25 SENSITIVE Sensitive     GENTAMICIN <=1 SENSITIVE Sensitive     IMIPENEM <=0.25 SENSITIVE Sensitive     NITROFURANTOIN 32 SENSITIVE Sensitive     TRIMETH/SULFA <=20 SENSITIVE Sensitive     AMPICILLIN/SULBACTAM <=2 SENSITIVE Sensitive     PIP/TAZO <=4 SENSITIVE Sensitive     * >=100,000 COLONIES/mL KLEBSIELLA PNEUMONIAE  C Difficile Quick Screen w PCR reflex     Status: None   Collection Time: 06/25/21  6:06 PM   Specimen: Stool  Result Value Ref Range Status   C Diff antigen NEGATIVE NEGATIVE Final   C Diff toxin NEGATIVE NEGATIVE Final   C Diff interpretation No C. difficile detected.  Final    Comment: Performed at [facility], [address] [place], [place] [address]  MRSA Next Gen by PCR, Nasal     Status: None   Collection Time: 06/25/21 10:55 PM   Specimen: Nasal Mucosa; Nasal Swab  Result Value Ref Range Status   MRSA by PCR Next Gen NOT DETECTED NOT DETECTED Final    Comment: (NOTE) [name] (FDA approved for NASAL specimens only), is one component of a comprehensive MRSA colonization surveillance program. It is not intended to diagnose MRSA infection nor to guide or monitor treatment for MRSA infections. Test performance is not FDA approved in patients less than 78 years old. Performed at [facility], [address] [place], [place] [address]   Culture, blood (Routine X 2) w Reflex to ID Panel     Status: None (Preliminary result)   Collection Time: 06/27/21  8:29 AM   Specimen: BLOOD RIGHT HAND  Result Value Ref Range Status   Specimen Description BLOOD RIGHT HAND  Final   Special Requests  Final    BOTTLES DRAWN  AEROBIC AND ANAEROBIC Blood Culture adequate volume   Culture   Final    NO GROWTH < 24 HOURS Performed at [facility], [address] [place], [place] [address]    Report Status PENDING  Incomplete  Culture, blood (Routine X 2) w Reflex to ID Panel     Status: None (Preliminary result)   Collection Time: 06/27/21  8:29 AM   Specimen: BLOOD RIGHT HAND  Result Value Ref Range Status   Specimen Description BLOOD RIGHT HAND  Final   Special Requests   Final    BOTTLES DRAWN AEROBIC ONLY Blood Culture adequate volume   Culture   Final    NO GROWTH < 24 HOURS Performed at [facility], [address], [place], [place] [address]    Report Status PENDING  Incomplete         Radiology Studies: ECHOCARDIOGRAM COMPLETE  Result Date: 06/27/2021    ECHOCARDIOGRAM REPORT   Patient Name:   Debra Saunders Date of Exam: 06/26/2021 Medical Rec #:  [id number]        Height:       67.0 in Accession #:    [id number]       Weight:       192.0 lb Date of Birth:  12/10/1945         BSA:          1.988 m Patient Age:    65 years         BP:           139/44 mmHg Patient Gender: F                HR:           87 bpm. Exam Location:  [facility] Procedure: 2D Echo, Cardiac Doppler and Color Doppler Indications:     R78.81 Bacteremia  History:         Patient has prior history of Echocardiogram examinations, most                  recent 09/12/2019. Cardiomyopathy, Stroke, Arrythmias:Atrial                  Fibrillation; Risk Factors:Hypertension, Dyslipidemia, Diabetes                  and Sleep Apnea.  Sonographer:     Cresenciano Lick RDCS Referring Phys:  [id number] Barb Merino Diagnosing Phys: Ida Rogue MD IMPRESSIONS  1. Unable to exclude valve endocarditis as detailed below.  2. Left ventricular ejection fraction, by estimation, is 60 to 65%. The left ventricle has normal function. The left ventricle has no regional wall motion abnormalities. There is mild left ventricular hypertrophy.  Left ventricular diastolic parameters are indeterminate.  3. Right ventricular systolic function is normal. The right ventricular size is normal. There is moderately elevated pulmonary artery systolic pressure. The estimated right ventricular systolic pressure is 67.1 mmHg.  4. The mitral valve is normal in structure. Mild to moderate mitral valve regurgitation. No evidence of mitral stenosis.  5. Tricuspid valve regurgitation is moderate. Thickening of the tricuspid leaflets, unable to exclude vegetation.  6. The aortic valve is normal in structure. There is moderate calcification of the aortic valve. There is moderate thickening of the aortic valve. Aortic valve regurgitation is not visualized. valve thickening appears worse compared to study in 2021, unable to exclude vegetation.  7. Aortic valve sclerosis/calcification is present, without any  evidence of aortic stenosis.  8. The inferior vena cava is normal in size with greater than 50% respiratory variability, suggesting right atrial pressure of 3 mmHg. FINDINGS  Left Ventricle: Left ventricular ejection fraction, by estimation, is 60 to 65%. The left ventricle has normal function. The left ventricle has no regional wall motion abnormalities. The left ventricular internal cavity size was normal in size. There is  mild left ventricular hypertrophy. Left ventricular diastolic parameters are indeterminate. Right Ventricle: The right ventricular size is normal. No increase in right ventricular wall thickness. Right ventricular systolic function is normal. There is moderately elevated pulmonary artery systolic pressure. The tricuspid regurgitant velocity is 3.41 m/s, and with an assumed right atrial pressure of 5 mmHg, the estimated right ventricular systolic pressure is 52.8 mmHg. Left Atrium: Left atrial size was normal in size. Right Atrium: Right atrial size was normal in size. Pericardium: There is no evidence of pericardial effusion. Mitral Valve: The mitral  valve is normal in structure. Mild to moderate mitral valve regurgitation. No evidence of mitral valve stenosis. Tricuspid Valve: The tricuspid valve is normal in structure. Tricuspid valve regurgitation is moderate . No evidence of tricuspid stenosis. Aortic Valve: The aortic valve is normal in structure. There is moderate calcification of the aortic valve. There is moderate thickening of the aortic valve. Aortic valve regurgitation is not visualized. Aortic valve sclerosis/calcification is present, without any evidence of aortic stenosis. Aortic valve mean gradient measures 10.5 mmHg. Aortic valve peak gradient measures 18.4 mmHg. Aortic valve area, by VTI measures 1.73 cm. Pulmonic Valve: The pulmonic valve was normal in structure. Pulmonic valve regurgitation is mild. No evidence of pulmonic stenosis. Aorta: The aortic root is normal in size and structure. Venous: The inferior vena cava is normal in size with greater than 50% respiratory variability, suggesting right atrial pressure of 3 mmHg. IAS/Shunts: No atrial level shunt detected by color flow Doppler. Additional Comments: A device lead is visualized.  LEFT VENTRICLE PLAX 2D LVIDd:         5.00 cm   Diastology LVIDs:         2.80 cm   LV e' medial:    9.25 cm/s LV PW:         1.20 cm   LV E/e' medial:  14.9 LV IVS:        1.20 cm   LV e' lateral:   7.83 cm/s LVOT diam:     1.90 cm   LV E/e' lateral: 17.6 LV SV:         62 LV SV Index:   31 LVOT Area:     2.84 cm  RIGHT VENTRICLE             IVC RV Basal diam:  3.80 cm     IVC diam: 1.80 cm RV S prime:     18.47 cm/s TAPSE (M-mode): 2.2 cm LEFT ATRIUM             Index        RIGHT ATRIUM           Index LA diam:        4.00 cm 2.01 cm/m   RA Area:     17.40 cm LA Vol (A2C):   75.5 ml 37.99 ml/m  RA Volume:   48.20 ml  24.25 ml/m LA Vol (A4C):   79.3 ml 39.90 ml/m LA Biplane Vol: 78.5 ml 39.50 ml/m  AORTIC VALVE AV Area (Vmax):    1.71 cm AV  Area (Vmean):   1.72 cm AV Area (VTI):     1.73 cm AV  Vmax:           214.50 cm/s AV Vmean:          152.250 cm/s AV VTI:            0.356 m AV Peak Grad:      18.4 mmHg AV Mean Grad:      10.5 mmHg LVOT Vmax:         129.00 cm/s LVOT Vmean:        92.350 cm/s LVOT VTI:          0.217 m LVOT/AV VTI ratio: 0.61  AORTA Ao Root diam: 2.80 cm Ao Asc diam:  3.50 cm MV E velocity: 137.67 cm/s  TRICUSPID VALVE                             TR Peak grad:   46.5 mmHg                             TR Vmax:        341.00 cm/s                              SHUNTS                             Systemic VTI:  0.22 m                             Systemic Diam: 1.90 cm Ida Rogue MD Electronically signed by Ida Rogue MD Signature Date/Time: 06/27/2021/10:07:07 AM    Final    US Abdomen Limited RUQ (LIVER/GB)  Result Date: 06/26/2021 CLINICAL DATA:  Right upper quadrant pain. EXAM: ULTRASOUND ABDOMEN LIMITED RIGHT UPPER QUADRANT COMPARISON:  CT abdomen/pelvis 06/24/2021. FINDINGS: Gallbladder: Cholecystectomy. Common bile duct: Diameter: 5 mm, within normal limits. Liver: No focal lesion identified. Within normal limits in parenchymal echogenicity. Portal vein is patent on color Doppler imaging with normal direction of blood flow towards the liver. IMPRESSION: Cholecystectomy.  No evidence of acute abnormality. Electronically Signed   By: Margaretha Sheffield M.D.   On: 06/26/2021 09:28        Scheduled Meds:  cholestyramine  4 g Oral Daily   Difluprednate  1 drop Both Eyes BID   DULoxetine  60 mg Oral Daily   gabapentin  300 mg Oral BID   ipratropium-albuterol  3 mL Nebulization Q4H   methylPREDNISolone (SOLU-MEDROL) injection  40 mg Intravenous Daily   midodrine  10 mg Oral TID WC   pantoprazole (PROTONIX) IV  40 mg Intravenous Q12H   Ensure Max Protein  11 oz Oral Daily   rosuvastatin  10 mg Oral Daily   Continuous Infusions:  cefTRIAXone (ROCEPHIN)  IV Stopped (06/28/21 0747)    sodium bicarbonate (isotonic) infusion in sterile water 100 mL/hr at 06/28/21 0306      LOS: 3 days    Time spent: 40 minutes    Barb Merino, MD [facility] Pager [phone]

## 2021-06-28 NOTE — Progress Notes (Signed)
°  Transition of Care [facility]) Screening Note   Patient Details  Name: Debra Saunders Date of Birth: August 14, 1945   Transition of Care [facility]) CM/SW Contact:    Alberteen Sam, LCSW Phone Number: 06/28/2021, 4:17 PM    Transition of Care Department [facility]) has reviewed patient and no TOC needs have been identified at this time. We will continue to monitor patient advancement through interdisciplinary progression rounds. If new patient transition needs arise, please place a TOC consult.  [name], [name]

## 2021-06-28 NOTE — ED Notes (Signed)
Patient is resting comfortably. 

## 2021-06-28 NOTE — Progress Notes (Signed)
*  PRELIMINARY RESULTS* Echocardiogram 2D Echocardiogram has been performed.  Debra Saunders 06/28/2021, 1:11 PM

## 2021-06-28 NOTE — Progress Notes (Signed)
[facility]  ROUNDING NOTE   Subjective:   Patient resting quietly in bed in dark room Denies shortness of breath Denies cough Appetite remains poor Complains of right sided abdominal pain  Creatinine 1.5 Urine output of 610ml recorded on day shift  Objective:  Vital signs in last 24 hours:  Temp:  [99.3 F (37.4 C)] 99.3 F (37.4 C) (01/06 1211) Pulse Rate:  [55-92] 71 (01/06 1300) Resp:  [9-34] 27 (01/06 1300) BP: (107-172)/(38-97) 119/44 (01/06 1300) SpO2:  [76 %-100 %] 94 % (01/06 1300)  Weight change:  Filed Weights   06/25/21 0851  Weight: 87.1 kg    Intake/Output: I/O last 3 completed shifts: In: -  Out: 1600 [Urine:1600]   Intake/Output this shift:  Total I/O In: 100 [IV Piggyback:100] Out: -   Physical Exam: General: NAD, resting quietly  Head: Normocephalic, atraumatic. Moist oral mucosal membranes  Eyes: Anicteric  Lungs:  Diminished bases, 4L [place]  Heart: Irregular rate and rhythm  Abdomen:  Soft, nontender  Extremities:  + peripheral edema in Rt leg.  Neurologic: Nonfocal, moving all four extremities  Skin: No lesions       Basic Metabolic Panel: Recent Labs  Lab 06/25/21 2243 06/26/21 0847 06/27/21 0828 06/27/21 1749 06/28/21 0500  NA 124* 124* 129* 131* 134*  K 5.1 5.0 4.5 4.5 3.8  CL 98 95* 95* 97* 100  CO2 15* 18* 24 25 30   GLUCOSE 201* 224* 227* 257* 242*  BUN 83* 96* 90* 84* 76*  CREATININE 3.21* 3.02* 2.05* 1.68* 1.46*  CALCIUM 8.0* 7.7* 7.2* 7.3* 7.5*     Liver Function Tests: Recent Labs  Lab 06/24/21 1456 06/25/21 1350 06/26/21 0847  AST 19 21 16   ALT 12 13 11   ALKPHOS 128* 126 137*  BILITOT 2.7* 3.0* 1.9*  PROT 6.3* 6.2* 6.6  ALBUMIN 2.5* 2.2* 2.3*    Recent Labs  Lab 06/24/21 1456  LIPASE 45    No results for input(s): AMMONIA in the last 168 hours.  CBC: Recent Labs  Lab 06/24/21 1456 06/25/21 1350 06/25/21 2243 06/26/21 0847 06/28/21 0500  WBC 20.1* 18.3* 22.8* 14.3* 9.3   NEUTROABS  --   --   --   --  7.3  HGB 8.1* 7.0* 8.2* 8.2* 7.7*  HCT 25.0* 22.0* 25.3* 25.8* 23.4*  MCV 85.0 85.9 85.5 84.3 83.9  PLT 99* 75* 87* 93* 118*     Cardiac Enzymes: No results for input(s): CKTOTAL, CKMB, CKMBINDEX, TROPONINI in the last 168 hours.  BNP: Invalid input(s): POCBNP  CBG: Recent Labs  Lab 06/26/21 0747 06/27/21 0728 06/28/21 0747 06/28/21 1211  GLUCAP 212* 218* 203* 190*     Microbiology: Results for orders placed or performed during the hospital encounter of 06/25/21  Blood culture (routine x 2)     Status: Abnormal   Collection Time: 06/25/21  2:33 AM   Specimen: BLOOD RIGHT FOREARM  Result Value Ref Range Status   Specimen Description   Final    BLOOD RIGHT FOREARM Performed at [facility], [address] [place], [place] [address]    Special Requests   Final    BOTTLES DRAWN AEROBIC AND ANAEROBIC Blood Culture results may not be optimal due to an inadequate volume of blood received in culture bottles Performed at [facility], [address], [place], [place] [address]    Culture  Setup Time   Final    GRAM POSITIVE COCCI IN BOTH AEROBIC AND ANAEROBIC BOTTLES Organism ID  to follow CRITICAL RESULT CALLED TO, READ BACK BY AND VERIFIED WITHArdeen Garland PHARMD 1425 06/25/21 HNM Performed at [facility], [address] [place], [place] [address]    Culture STREPTOCOCCUS GROUP G (A)  Final   Report Status 06/28/2021 FINAL  Final   Organism ID, Bacteria STREPTOCOCCUS GROUP G  Final      Susceptibility   Streptococcus group g - MIC*    CLINDAMYCIN RESISTANT Resistant     AMPICILLIN <=0.25 SENSITIVE Sensitive     ERYTHROMYCIN 2 RESISTANT Resistant     VANCOMYCIN 0.5 SENSITIVE Sensitive     CEFTRIAXONE <=0.12 SENSITIVE Sensitive     LEVOFLOXACIN 0.5 SENSITIVE Sensitive     * STREPTOCOCCUS GROUP G  Blood Culture ID Panel (Reflexed)     Status: Abnormal   Collection Time: 06/25/21  2:33 AM  Result Value  Ref Range Status   Enterococcus faecalis NOT DETECTED NOT DETECTED Final   Enterococcus Faecium NOT DETECTED NOT DETECTED Final   Listeria monocytogenes NOT DETECTED NOT DETECTED Final   Staphylococcus species NOT DETECTED NOT DETECTED Final   Staphylococcus aureus (BCID) NOT DETECTED NOT DETECTED Final   Staphylococcus epidermidis NOT DETECTED NOT DETECTED Final   Staphylococcus lugdunensis NOT DETECTED NOT DETECTED Final   Streptococcus species DETECTED (A) NOT DETECTED Final    Comment: Not Enterococcus species, Streptococcus agalactiae, Streptococcus pyogenes, or Streptococcus pneumoniae. CRITICAL RESULT CALLED TO, READ BACK BY AND VERIFIED WITH: Muir DZHGDJ 2426 06/25/21 HNM    Streptococcus agalactiae NOT DETECTED NOT DETECTED Final   Streptococcus pneumoniae NOT DETECTED NOT DETECTED Final   Streptococcus pyogenes NOT DETECTED NOT DETECTED Final   A.calcoaceticus-baumannii NOT DETECTED NOT DETECTED Final   Bacteroides fragilis NOT DETECTED NOT DETECTED Final   Enterobacterales NOT DETECTED NOT DETECTED Final   Enterobacter cloacae complex NOT DETECTED NOT DETECTED Final   Escherichia coli NOT DETECTED NOT DETECTED Final   Klebsiella aerogenes NOT DETECTED NOT DETECTED Final   Klebsiella oxytoca NOT DETECTED NOT DETECTED Final   Klebsiella pneumoniae NOT DETECTED NOT DETECTED Final   Proteus species NOT DETECTED NOT DETECTED Final   Salmonella species NOT DETECTED NOT DETECTED Final   Serratia marcescens NOT DETECTED NOT DETECTED Final   Haemophilus influenzae NOT DETECTED NOT DETECTED Final   Neisseria meningitidis NOT DETECTED NOT DETECTED Final   Pseudomonas aeruginosa NOT DETECTED NOT DETECTED Final   Stenotrophomonas maltophilia NOT DETECTED NOT DETECTED Final   Candida albicans NOT DETECTED NOT DETECTED Final   Candida auris NOT DETECTED NOT DETECTED Final   Candida glabrata NOT DETECTED NOT DETECTED Final   Candida krusei NOT DETECTED NOT DETECTED Final   Candida  parapsilosis NOT DETECTED NOT DETECTED Final   Candida tropicalis NOT DETECTED NOT DETECTED Final   Cryptococcus neoformans/gattii NOT DETECTED NOT DETECTED Final    Comment: Performed at [facility], [address] [place], [place] [address]  Resp Panel by RT-PCR (Flu A&B, Covid) Nasopharyngeal Swab     Status: None   Collection Time: 06/25/21  3:33 AM   Specimen: Nasopharyngeal Swab; Nasopharyngeal(NP) swabs in vial transport medium  Result Value Ref Range Status   SARS Coronavirus 2 by RT PCR NEGATIVE NEGATIVE Final    Comment: (NOTE) SARS-CoV-2 target nucleic acids are NOT DETECTED.  The SARS-CoV-2 RNA is generally detectable in upper respiratory specimens during the acute phase of infection. The lowest concentration of SARS-CoV-2 viral copies this assay can detect is 138 copies/mL. A negative result does not preclude SARS-Cov-2 infection  and should not be used as the sole basis for treatment or other patient management decisions. A negative result may occur with  improper specimen collection/handling, submission of specimen other than nasopharyngeal swab, presence of viral mutation(s) within the areas targeted by this assay, and inadequate number of viral copies(<138 copies/mL). A negative result must be combined with clinical observations, patient history, and epidemiological information. The expected result is Negative.  Fact Sheet for Patients:  EntrepreneurPulse.com.au  Fact Sheet for Healthcare Providers:  IncredibleEmployment.be  This test is no t yet approved or cleared by the [place] FDA and  has been authorized for detection and/or diagnosis of SARS-CoV-2 by FDA under an Emergency Use Authorization (EUA). This EUA will remain  in effect (meaning this test can be used) for the duration of the COVID-19 declaration under Section 564(b)(1) of the Act, 21 U.S.C.section 360bbb-3(b)(1), unless the authorization is  terminated  or revoked sooner.       Influenza A by PCR NEGATIVE NEGATIVE Final   Influenza B by PCR NEGATIVE NEGATIVE Final    Comment: (NOTE) The Xpert Xpress SARS-CoV-2/FLU/RSV plus assay is intended as an aid in the diagnosis of influenza from Nasopharyngeal swab specimens and should not be used as a sole basis for treatment. Nasal washings and aspirates are unacceptable for Xpert Xpress SARS-CoV-2/FLU/RSV testing.  Fact Sheet for Patients: EntrepreneurPulse.com.au  Fact Sheet for Healthcare Providers: IncredibleEmployment.be  This test is not yet approved or cleared by the [place] FDA and has been authorized for detection and/or diagnosis of SARS-CoV-2 by FDA under an Emergency Use Authorization (EUA). This EUA will remain in effect (meaning this test can be used) for the duration of the COVID-19 declaration under Section 564(b)(1) of the Act, 21 U.S.C. section 360bbb-3(b)(1), unless the authorization is terminated or revoked.  Performed at [facility], [address] [place], [place] [address]   Blood culture (routine x 2)     Status: Abnormal   Collection Time: 06/25/21  5:55 AM   Specimen: BLOOD  Result Value Ref Range Status   Specimen Description   Final    BLOOD LEFT ARM Performed at [facility], [address] [place], [place] [address]    Special Requests   Final    BOTTLES DRAWN AEROBIC AND ANAEROBIC Blood Culture adequate volume Performed at [facility], [address], [place], [place] [address]    Culture  Setup Time   Final    GRAM POSITIVE COCCI IN BOTH AEROBIC AND ANAEROBIC BOTTLES CRITICAL VALUE NOTED.  VALUE IS CONSISTENT WITH PREVIOUSLY REPORTED AND CALLED VALUE. Performed at [facility], [address] [place], [place] [address]    Culture (A)  Final    STREPTOCOCCUS GROUP G SUSCEPTIBILITIES PERFORMED ON PREVIOUS CULTURE WITHIN THE LAST 5 DAYS. Performed  at [facility], [place] [address], [place], [place] [address]    Report Status 06/28/2021 FINAL  Final  Urine Culture     Status: Abnormal   Collection Time: 06/25/21  6:55 AM   Specimen: Urine, Clean Catch  Result Value Ref Range Status   Specimen Description   Final    URINE, CLEAN CATCH Performed at [facility], [address] [place], [place] [address]    Special Requests   Final    NONE Performed at [facility], [address], [place], [place] [address]    Culture >=100,000 COLONIES/mL KLEBSIELLA PNEUMONIAE (A)  Final   Report Status 06/27/2021 FINAL  Final  Organism ID, Bacteria KLEBSIELLA PNEUMONIAE (A)  Final      Susceptibility   Klebsiella pneumoniae - MIC*    AMPICILLIN RESISTANT Resistant     CEFAZOLIN <=4 SENSITIVE Sensitive     CEFEPIME <=0.12 SENSITIVE Sensitive     CEFTRIAXONE <=0.25 SENSITIVE Sensitive     CIPROFLOXACIN <=0.25 SENSITIVE Sensitive     GENTAMICIN <=1 SENSITIVE Sensitive     IMIPENEM <=0.25 SENSITIVE Sensitive     NITROFURANTOIN 32 SENSITIVE Sensitive     TRIMETH/SULFA <=20 SENSITIVE Sensitive     AMPICILLIN/SULBACTAM <=2 SENSITIVE Sensitive     PIP/TAZO <=4 SENSITIVE Sensitive     * >=100,000 COLONIES/mL KLEBSIELLA PNEUMONIAE  C Difficile Quick Screen w PCR reflex     Status: None   Collection Time: 06/25/21  6:06 PM   Specimen: Stool  Result Value Ref Range Status   C Diff antigen NEGATIVE NEGATIVE Final   C Diff toxin NEGATIVE NEGATIVE Final   C Diff interpretation No C. difficile detected.  Final    Comment: Performed at [facility], [address] [place], [place] [address]  MRSA Next Gen by PCR, Nasal     Status: None   Collection Time: 06/25/21 10:55 PM   Specimen: Nasal Mucosa; Nasal Swab  Result Value Ref Range Status   MRSA by PCR Next Gen NOT DETECTED NOT DETECTED Final    Comment: (NOTE) [name] (FDA approved for NASAL specimens only), is one component of a comprehensive  MRSA colonization surveillance program. It is not intended to diagnose MRSA infection nor to guide or monitor treatment for MRSA infections. Test performance is not FDA approved in patients less than 87 years old. Performed at [facility], [address] [place], [place] [address]   Culture, blood (Routine X 2) w Reflex to ID Panel     Status: None (Preliminary result)   Collection Time: 06/27/21  8:29 AM   Specimen: BLOOD RIGHT HAND  Result Value Ref Range Status   Specimen Description BLOOD RIGHT HAND  Final   Special Requests   Final    BOTTLES DRAWN AEROBIC AND ANAEROBIC Blood Culture adequate volume   Culture   Final    NO GROWTH < 24 HOURS Performed at [facility], [address] [place], [place] [address]    Report Status PENDING  Incomplete  Culture, blood (Routine X 2) w Reflex to ID Panel     Status: None (Preliminary result)   Collection Time: 06/27/21  8:29 AM   Specimen: BLOOD RIGHT HAND  Result Value Ref Range Status   Specimen Description BLOOD RIGHT HAND  Final   Special Requests   Final    BOTTLES DRAWN AEROBIC ONLY Blood Culture adequate volume   Culture   Final    NO GROWTH < 24 HOURS Performed at [facility], [address] [name],  [address]    Report Status PENDING  Incomplete   *Note: Due to a large number of results and/or encounters for the requested time period, some results have not been displayed. A complete set of results can be found in Results Review.    Coagulation Studies: No results for input(s): LABPROT, INR in the last 72 hours.   Urinalysis: No results for input(s): COLORURINE, LABSPEC, PHURINE, GLUCOSEU, HGBUR, BILIRUBINUR, KETONESUR, PROTEINUR, UROBILINOGEN, NITRITE, LEUKOCYTESUR in the last 72 hours.  Invalid input(s): APPERANCEUR     Imaging: ECHOCARDIOGRAM COMPLETE  Result Date: 06/27/2021    ECHOCARDIOGRAM REPORT  Patient Name:   RUSSIE GULLEDGE Date of Exam: 06/26/2021 Medical Rec  #:  [id number]        Height:       67.0 in Accession #:    [id number]       Weight:       192.0 lb Date of Birth:  1945-07-09         BSA:          1.988 m Patient Age:    33 years         BP:           139/44 mmHg Patient Gender: F                HR:           87 bpm. Exam Location:  [facility] Procedure: 2D Echo, Cardiac Doppler and Color Doppler Indications:     R78.81 Bacteremia  History:         Patient has prior history of Echocardiogram examinations, most                  recent 09/12/2019. Cardiomyopathy, Stroke, Arrythmias:Atrial                  Fibrillation; Risk Factors:Hypertension, Dyslipidemia, Diabetes                  and Sleep Apnea.  Sonographer:     Cresenciano Lick RDCS Referring Phys:  [id number] Barb Merino Diagnosing Phys: Ida Rogue MD IMPRESSIONS  1. Unable to exclude valve endocarditis as detailed below.  2. Left ventricular ejection fraction, by estimation, is 60 to 65%. The left ventricle has normal function. The left ventricle has no regional wall motion abnormalities. There is mild left ventricular hypertrophy. Left ventricular diastolic parameters are indeterminate.  3. Right ventricular systolic function is normal. The right ventricular size is normal. There is moderately elevated pulmonary artery systolic pressure. The estimated right ventricular systolic pressure is 16.6 mmHg.  4. The mitral valve is normal in structure. Mild to moderate mitral valve regurgitation. No evidence of mitral stenosis.  5. Tricuspid valve regurgitation is moderate. Thickening of the tricuspid leaflets, unable to exclude vegetation.  6. The aortic valve is normal in structure. There is moderate calcification of the aortic valve. There is moderate thickening of the aortic valve. Aortic valve regurgitation is not visualized. valve thickening appears worse compared to study in 2021, unable to exclude vegetation.  7. Aortic valve sclerosis/calcification is present, without any evidence of aortic stenosis.   8. The inferior vena cava is normal in size with greater than 50% respiratory variability, suggesting right atrial pressure of 3 mmHg. FINDINGS  Left Ventricle: Left ventricular ejection fraction, by estimation, is 60 to 65%. The left ventricle has normal function. The left ventricle has no regional wall motion abnormalities. The left ventricular internal cavity size was normal in size. There is  mild left ventricular hypertrophy. Left ventricular diastolic parameters are indeterminate. Right Ventricle: The right ventricular size is normal. No increase in right ventricular wall thickness. Right ventricular systolic function is normal. There is moderately elevated pulmonary artery systolic pressure. The tricuspid regurgitant velocity is 3.41 m/s, and with an assumed right atrial pressure of 5 mmHg, the estimated right ventricular systolic pressure is 06.3 mmHg. Left Atrium: Left atrial size was normal in size. Right Atrium: Right atrial size was normal in size. Pericardium: There is no evidence of pericardial effusion. Mitral Valve: The mitral valve is normal in structure. Mild  to moderate mitral valve regurgitation. No evidence of mitral valve stenosis. Tricuspid Valve: The tricuspid valve is normal in structure. Tricuspid valve regurgitation is moderate . No evidence of tricuspid stenosis. Aortic Valve: The aortic valve is normal in structure. There is moderate calcification of the aortic valve. There is moderate thickening of the aortic valve. Aortic valve regurgitation is not visualized. Aortic valve sclerosis/calcification is present, without any evidence of aortic stenosis. Aortic valve mean gradient measures 10.5 mmHg. Aortic valve peak gradient measures 18.4 mmHg. Aortic valve area, by VTI measures 1.73 cm. Pulmonic Valve: The pulmonic valve was normal in structure. Pulmonic valve regurgitation is mild. No evidence of pulmonic stenosis. Aorta: The aortic root is normal in size and structure. Venous: The  inferior vena cava is normal in size with greater than 50% respiratory variability, suggesting right atrial pressure of 3 mmHg. IAS/Shunts: No atrial level shunt detected by color flow Doppler. Additional Comments: A device lead is visualized.  LEFT VENTRICLE PLAX 2D LVIDd:         5.00 cm   Diastology LVIDs:         2.80 cm   LV e' medial:    9.25 cm/s LV PW:         1.20 cm   LV E/e' medial:  14.9 LV IVS:        1.20 cm   LV e' lateral:   7.83 cm/s LVOT diam:     1.90 cm   LV E/e' lateral: 17.6 LV SV:         62 LV SV Index:   31 LVOT Area:     2.84 cm  RIGHT VENTRICLE             IVC RV Basal diam:  3.80 cm     IVC diam: 1.80 cm RV S prime:     18.47 cm/s TAPSE (M-mode): 2.2 cm LEFT ATRIUM             Index        RIGHT ATRIUM           Index LA diam:        4.00 cm 2.01 cm/m   RA Area:     17.40 cm LA Vol (A2C):   75.5 ml 37.99 ml/m  RA Volume:   48.20 ml  24.25 ml/m LA Vol (A4C):   79.3 ml 39.90 ml/m LA Biplane Vol: 78.5 ml 39.50 ml/m  AORTIC VALVE AV Area (Vmax):    1.71 cm AV Area (Vmean):   1.72 cm AV Area (VTI):     1.73 cm AV Vmax:           214.50 cm/s AV Vmean:          152.250 cm/s AV VTI:            0.356 m AV Peak Grad:      18.4 mmHg AV Mean Grad:      10.5 mmHg LVOT Vmax:         129.00 cm/s LVOT Vmean:        92.350 cm/s LVOT VTI:          0.217 m LVOT/AV VTI ratio: 0.61  AORTA Ao Root diam: 2.80 cm Ao Asc diam:  3.50 cm MV E velocity: 137.67 cm/s  TRICUSPID VALVE                             TR Peak grad:  46.5 mmHg                             TR Vmax:        341.00 cm/s                              SHUNTS                             Systemic VTI:  0.22 m                             Systemic Diam: 1.90 cm Ida Rogue MD Electronically signed by Ida Rogue MD Signature Date/Time: 06/27/2021/10:07:07 AM    Final      Medications:    sodium chloride     [MAR Hold] cefTRIAXone (ROCEPHIN)  IV Stopped (06/28/21 0747)    sodium bicarbonate (isotonic) infusion in sterile water 100  mL/hr at 06/28/21 0930    butamben-tetracaine-benzocaine       [MAR Hold] cholestyramine  4 g Oral Daily   [MAR Hold] Difluprednate  1 drop Both Eyes BID   [MAR Hold] DULoxetine  60 mg Oral Daily   fentaNYL       [MAR Hold] gabapentin  300 mg Oral BID   [MAR Hold] ipratropium-albuterol  3 mL Nebulization Q4H   lidocaine       [MAR Hold] methylPREDNISolone (SOLU-MEDROL) injection  40 mg Intravenous Daily   midazolam       [MAR Hold] midodrine  10 mg Oral TID WC   morphine       [MAR Hold] pantoprazole (PROTONIX) IV  40 mg Intravenous Q12H   [MAR Hold] Ensure Max Protein  11 oz Oral Daily   [MAR Hold] rosuvastatin  10 mg Oral Daily   sodium chloride flush       [MAR Hold] acetaminophen, [MAR Hold] albuterol, [MAR Hold] dextromethorphan-guaiFENesin, [MAR Hold] meclizine, [MAR Hold] Menthol, [MAR Hold]  morphine injection, [MAR Hold] ondansetron (ZOFRAN) IV  Assessment/ Plan:  Ms. DERIONNA SALVADOR is a 76 y.o.  female  with medical problems of hypertension, hyperlipidemia, diabetes, depression, subdural hematoma, sinoatrial node dysfunction with history of pacemaker placement, history of obstructive sleep apnea, pulmonary arterial hypertension, interstitial lung disease on chronic 4 L oxygen, diastolic CHF, atrial fibrillation, anemia, history of DVT  was admitted on 06/25/2021 for Hyponatremia [E87.1] Thrombocytopenia ([place]) [D69.6] Demand ischemia ([place]) [I24.8] Acute respiratory failure with hypoxia ([place]) [J96.01] AKI (acute kidney injury) ([place]) [N17.9] Abdominal pain [R10.9] Septic shock ([place]) [A41.9, R65.21] Sepsis due to pneumonia ([place]) [J18.9, A41.9] Multifocal pneumonia [J18.9] Acute on chronic anemia [D64.9] Sepsis with acute organ dysfunction without septic shock, due to unspecified organism, unspecified type ([place]) [A41.9, R65.20]  Urinalysis June 25, 2021: Small leukocytes, 30 mg protein, many bacteria, 6-10 WBCs, 0-5 RBCs. Urine culture greater than 100,000 Klebsiella  pneumoniae Imaging: CT abdomen noncontrast June 24, 2021:Atherosclerotic disease of abdominal aorta, IVC filter present, normal kidneys and bladder.  Acute Kidney Injury with hyponatremia on chronic kidney disease stage 3b with baseline creatinine 1.04 and GFR of 55 on 04/29/21.  Acute kidney injury secondary to concurrent sepsis and hypotension No indication of dialysis at this time.   Creatinine continues to improve  Sodium improved to 134  Lab Results  Component Value Date   CREATININE  1.46 (H) 06/28/2021   CREATININE 1.68 (H) 06/27/2021   CREATININE 2.05 (H) 06/27/2021    Intake/Output Summary (Last 24 hours) at 06/28/2021 1313 Last data filed at 06/28/2021 0747 Gross per 24 hour  Intake 100 ml  Output --  Net 100 ml    2. Sepsis-Streptococcus bacteremia and Klebsiella pneumoniae in urine, multifocal pneumonia. Receiving Rocephin   3. 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

## 2021-06-28 NOTE — ED Notes (Signed)
CBG 203 

## 2021-06-28 NOTE — Progress Notes (Signed)
Date of Admission:  06/25/2021     ID: Debra Saunders is a 76 y.o. female  Principal Problem:   Severe sepsis with septic shock ([place]) Active Problems:   Pulmonary hypertension, unspecified (Debra Saunders)   Acute on chronic respiratory failure with hypoxia (HCC)   Normocytic anemia   Interstitial lung disease (HCC)   UTI (urinary tract infection)   Atrial fibrillation (HCC)   Multifocal pneumonia   HTN (hypertension)   HLD (hyperlipidemia)   Chronic diastolic CHF (congestive heart failure) (HCC)   Acute renal failure superimposed on stage 3a chronic kidney disease (HCC)   DVT (deep venous thrombosis) (HCC)   Hyponatremia   Thrombocytopenia (HCC)   Diarrhea   Bacteremia due to Gram-positive bacteria   GI bleeding   Black stool   Septic shock (HCC)    Subjective: Pt had TEE today Very tired 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 and [facility]  Medications:   butamben-tetracaine-benzocaine       [MAR Hold] cholestyramine  4 g Oral Daily   [MAR Hold] Difluprednate  1 drop Both Eyes BID   [MAR Hold] DULoxetine  60 mg Oral Daily   fentaNYL       [MAR Hold] gabapentin  300 mg Oral BID   [MAR Hold] ipratropium-albuterol  3 mL Nebulization Q4H   lidocaine       [MAR Hold] methylPREDNISolone (SOLU-MEDROL) injection  40 mg Intravenous Daily   midazolam       [MAR Hold] midodrine  10 mg Oral TID WC   morphine       [MAR Hold] pantoprazole (PROTONIX) IV  40 mg Intravenous Q12H   [MAR Hold] Ensure Max Protein  11 oz Oral Daily   [MAR Hold] rosuvastatin  10 mg Oral Daily   sodium chloride flush        Objective: Vital signs in last 24 hours: Patient Vitals for the past 24 hrs:  BP Temp Temp src Pulse Resp SpO2  06/28/21 1211 (!) 126/55 99.3 F (37.4 C) Oral 70 (!) 22 94 %  06/28/21 0530 (!) 108/55 -- -- 69 (!) 23 99 %  06/28/21 0515 -- -- -- 72 (!) 21 98 %  06/28/21 0500 (!) 112/42 -- -- 70 (!) 21 99 %  06/28/21 0445 -- --  -- 75 20 99 %  06/28/21 0430 (!) 107/40 -- -- 89 (!) 21 98 %  06/28/21 0415 -- -- -- 76 17 98 %  06/28/21 0400 135/61 -- -- 77 18 98 %  06/28/21 0345 -- -- -- 72 16 99 %  06/28/21 0330 135/69 -- -- 81 17 98 %  06/28/21 0315 -- -- -- 92 16 97 %  06/28/21 0300 (!) 134/54 -- -- 74 17 99 %  06/28/21 0245 -- -- -- 68 16 99 %  06/28/21 0230 138/62 -- -- 72 20 99 %  06/28/21 0215 -- -- -- 70 19 100 %  06/28/21 0200 (!) 133/56 -- -- 70 17 99 %  06/28/21 0145 -- -- -- 65 15 99 %  06/28/21 0130 (!) 141/59 -- -- 76 (!) 34 100 %  06/28/21 0115 -- -- -- 73 18 100 %  06/28/21 0100 (!) 147/58 -- -- 71 (!) 34 98 %  06/28/21 0045 -- -- -- 70 (!) 27 99 %  06/28/21 0030 138/62 -- -- 70 (!) 32 98 %  06/28/21 0015 -- -- -- 75 (!) 28 98 %  06/28/21 0000 (!) 142/61 -- -- 70 (!) 29 97 %  06/27/21 2345 -- -- -- 73 19 99 %  06/27/21 2330 (!) 154/59 -- -- 75 17 99 %  06/27/21 2315 -- -- -- 76 (!) 23 99 %  06/27/21 2300 (!) 141/61 -- -- 72 17 98 %  06/27/21 2245 -- -- -- 71 17 98 %  06/27/21 2230 (!) 158/66 -- -- 70 19 98 %  06/27/21 2215 -- -- -- (!) 55 (!) 23 98 %  06/27/21 2200 (!) 166/58 -- -- 71 (!) 22 97 %  06/27/21 2145 -- -- -- 70 (!) 22 99 %  06/27/21 2130 (!) 172/68 -- -- 70 (!) 29 98 %  06/27/21 2120 (!) 159/38 -- -- 73 17 100 %  06/27/21 2115 -- -- -- 67 19 98 %  06/27/21 2100 -- -- -- 80 (!) 22 97 %  06/27/21 2045 -- -- -- 70 16 99 %  06/27/21 2030 -- -- -- 70 18 97 %  06/27/21 2015 -- -- -- 70 18 98 %  06/27/21 2000 -- -- -- 70 17 98 %  06/27/21 1945 -- -- -- 70 18 99 %  06/27/21 1930 (!) 142/44 -- -- 70 20 98 %  06/27/21 1915 -- -- -- 70 (!) 33 99 %  06/27/21 1900 (!) 123/97 -- -- 69 18 100 %  06/27/21 1845 -- -- -- 69 (!) 23 100 %  06/27/21 1830 117/82 -- -- 70 20 97 %  06/27/21 1815 -- -- -- 71 16 99 %  06/27/21 1800 -- -- -- 69 (!) 21 96 %  06/27/21 1745 -- -- -- 70 17 100 %  06/27/21 1730 (!) 133/53 -- -- 70 16 98 %  06/27/21 1715 -- -- -- 70 18 98 %  06/27/21 1700 (!) 125/51  -- -- 70 17 98 %  06/27/21 1645 -- -- -- 70 (!) 21 98 %  06/27/21 1630 -- -- -- 70 (!) 23 99 %  06/27/21 1615 -- -- -- 73 (!) 22 92 %  06/27/21 1600 -- -- -- 70 (!) 22 99 %  06/27/21 1545 -- -- -- 71 (!) 21 97 %  06/27/21 1530 -- -- -- 70 20 96 %  06/27/21 1515 -- -- -- 67 (!) 21 95 %  06/27/21 1500 -- -- -- 70 15 97 %  06/27/21 1445 -- -- -- 69 (!) 22 100 %  06/27/21 1430 -- -- -- 69 20 100 %  06/27/21 1415 -- -- -- 70 (!) 9 97 %  06/27/21 1400 -- -- -- 69 17 97 %  06/27/21 1345 -- -- -- 70 18 99 %  06/27/21 1330 (!) 141/70 -- -- 65 16 98 %  06/27/21 1315 -- -- -- 70 18 100 %  06/27/21 1300 135/63 -- -- 70 (!) 21 95 %  06/27/21 1245 -- -- -- 70 11 97 %  06/27/21 1230 (!) 123/52 -- -- 70 16 100 %     PHYSICAL EXAM:  General: Alert, cooperative, no distress, appears stated age.  Lungs: b/l air entry Decreased rt base Heart: irregular- rate controlled Abdomen: Soft, non-tender,not distended. Bowel sounds normal. No masses Extremities left leg edema Skin: No rashes or lesions. Or bruising Lymph: Cervical, supraclavicular normal. Neurologic: Grossly non-focal  Lab Results Recent Labs    06/26/21 0847 06/27/21 0828 06/27/21 1749 06/28/21 0500  WBC 14.3*  --   --  9.3  HGB 8.2*  --   --  7.7*  HCT 25.8*  --   --  23.4*  NA 124*   < > 131* 134*  K 5.0   < > 4.5 3.8  CL 95*   < > 97* 100  CO2 18*   < > 25 30  BUN 96*   < > 84* 76*  CREATININE 3.02*   < > 1.68* 1.46*   < > = values in this interval not displayed.   Liver Panel Recent Labs    06/25/21 1350 06/26/21 0847  PROT 6.2* 6.6  ALBUMIN 2.2* 2.3*  AST 21 16  ALT 13 11  ALKPHOS 126 137*  BILITOT 3.0* 1.9*  BILIDIR 1.7*  --   IBILI 1.3*  --    Sedimentation Rate No results for input(s): ESRSEDRATE in the last 72 hours. C-Reactive Protein No results for input(s): CRP in the last 72 hours.  Microbiology: 06/25/21- [facility]- strep Group G 06/27/21 [facility]- NG 06/25/21 [facility]- klebsiella Studies/Results: ECHOCARDIOGRAM  COMPLETE  Result Date: 06/27/2021    ECHOCARDIOGRAM REPORT   Patient Name:   Debra Saunders Date of Exam: 06/26/2021 Medical Rec #:  [id number]        Height:       67.0 in Accession #:    [id number]       Weight:       192.0 lb Date of Birth:  23-Oct-1945         BSA:          1.988 m Patient Age:    76 years         BP:           139/44 mmHg Patient Gender: F                HR:           87 bpm. Exam Location:  [facility] Procedure: 2D Echo, Cardiac Doppler and Color Doppler Indications:     R78.81 Bacteremia  History:         Patient has prior history of Echocardiogram examinations, most                  recent 09/12/2019. Cardiomyopathy, Stroke, Arrythmias:Atrial                  Fibrillation; Risk Factors:Hypertension, Dyslipidemia, Diabetes                  and Sleep Apnea.  Sonographer:     Cresenciano Lick RDCS Referring Phys:  [id number] Barb Merino Diagnosing Phys: Ida Rogue MD IMPRESSIONS  1. Unable to exclude valve endocarditis as detailed below.  2. Left ventricular ejection fraction, by estimation, is 60 to 65%. The left ventricle has normal function. The left ventricle has no regional wall motion abnormalities. There is mild left ventricular hypertrophy. Left ventricular diastolic parameters are indeterminate.  3. Right ventricular systolic function is normal. The right ventricular size is normal. There is moderately elevated pulmonary artery systolic pressure. The estimated right ventricular systolic pressure is 98.3 mmHg.  4. The mitral valve is normal in structure. Mild to moderate mitral valve regurgitation. No evidence of mitral stenosis.  5. Tricuspid valve regurgitation is moderate. Thickening of the tricuspid leaflets, unable to exclude vegetation.  6. The aortic valve is normal in structure. There is moderate calcification of the aortic valve. There is moderate thickening of the aortic valve. Aortic valve regurgitation is not visualized. valve thickening appears worse compared to study in  2021, unable to exclude vegetation.  7. Aortic valve sclerosis/calcification is present, without any evidence of aortic stenosis.  8. The inferior vena cava is normal in size with greater than 50% respiratory variability, suggesting right atrial pressure of 3 mmHg. FINDINGS  Left Ventricle: Left ventricular ejection fraction, by estimation, is 60 to 65%. The left ventricle has normal function. The left ventricle has no regional wall motion abnormalities. The left ventricular internal cavity size was normal in size. There is  mild left ventricular hypertrophy. Left ventricular diastolic parameters are indeterminate. Right Ventricle: The right ventricular size is normal. No increase in right ventricular wall thickness. Right ventricular systolic function is normal. There is moderately elevated pulmonary artery systolic pressure. The tricuspid regurgitant velocity is 3.41 m/s, and with an assumed right atrial pressure of 5 mmHg, the estimated right ventricular systolic pressure is 31.4 mmHg. Left Atrium: Left atrial size was normal in size. Right Atrium: Right atrial size was normal in size. Pericardium: There is no evidence of pericardial effusion. Mitral Valve: The mitral valve is normal in structure. Mild to moderate mitral valve regurgitation. No evidence of mitral valve stenosis. Tricuspid Valve: The tricuspid valve is normal in structure. Tricuspid valve regurgitation is moderate . No evidence of tricuspid stenosis. Aortic Valve: The aortic valve is normal in structure. There is moderate calcification of the aortic valve. There is moderate thickening of the aortic valve. Aortic valve regurgitation is not visualized. Aortic valve sclerosis/calcification is present, without any evidence of aortic stenosis. Aortic valve mean gradient measures 10.5 mmHg. Aortic valve peak gradient measures 18.4 mmHg. Aortic valve area, by VTI measures 1.73 cm. Pulmonic Valve: The pulmonic valve was normal in structure. Pulmonic  valve regurgitation is mild. No evidence of pulmonic stenosis. Aorta: The aortic root is normal in size and structure. Venous: The inferior vena cava is normal in size with greater than 50% respiratory variability, suggesting right atrial pressure of 3 mmHg. IAS/Shunts: No atrial level shunt detected by color flow Doppler. Additional Comments: A device lead is visualized.  LEFT VENTRICLE PLAX 2D LVIDd:         5.00 cm   Diastology LVIDs:         2.80 cm   LV e' medial:    9.25 cm/s LV PW:         1.20 cm   LV E/e' medial:  14.9 LV IVS:        1.20 cm   LV e' lateral:   7.83 cm/s LVOT diam:     1.90 cm   LV E/e' lateral: 17.6 LV SV:         62 LV SV Index:   31 LVOT Area:     2.84 cm  RIGHT VENTRICLE             IVC RV Basal diam:  3.80 cm     IVC diam: 1.80 cm RV S prime:     18.47 cm/s TAPSE (M-mode): 2.2 cm LEFT ATRIUM             Index        RIGHT ATRIUM           Index LA diam:        4.00 cm 2.01 cm/m   RA Area:     17.40 cm LA Vol (A2C):   75.5 ml 37.99 ml/m  RA Volume:   48.20 ml  24.25 ml/m LA Vol (A4C):   79.3 ml 39.90 ml/m LA Biplane Vol: 78.5 ml 39.50 ml/m  AORTIC VALVE AV  Area (Vmax):    1.71 cm AV Area (Vmean):   1.72 cm AV Area (VTI):     1.73 cm AV Vmax:           214.50 cm/s AV Vmean:          152.250 cm/s AV VTI:            0.356 m AV Peak Grad:      18.4 mmHg AV Mean Grad:      10.5 mmHg LVOT Vmax:         129.00 cm/s LVOT Vmean:        92.350 cm/s LVOT VTI:          0.217 m LVOT/AV VTI ratio: 0.61  AORTA Ao Root diam: 2.80 cm Ao Asc diam:  3.50 cm MV E velocity: 137.67 cm/s  TRICUSPID VALVE                             TR Peak grad:   46.5 mmHg                             TR Vmax:        341.00 cm/s                              SHUNTS                             Systemic VTI:  0.22 m                             Systemic Diam: 1.90 cm Ida Rogue MD Electronically signed by Ida Rogue MD Signature Date/Time: 06/27/2021/10:07:07 AM    Final      Assessment/Plan: Streptococcus   Group G bacteremia with sepsis - with Large mobile vegetation on the RV pacing wire noted in the right  atrium,  estimated to be 1 x 1.5 cm in size or larger. Pt is being [facility] for removal of the pacemake  on ceftriaxone- will need for 4-6 weeks   Acute on chronic resp failure- has ILD/COPD/OSA Likely septic emboli to the lungs Afib on eliquis and digoxin   AKI on CKD-improving   Anemia' Seen by GI for one dark stool- no endoscopy for now Chronic diarrhea for many years - takes cholestyramine- cdiff neg Last Colonoscopy March 2020- polyps removed- tubulovillous adenoma   Thrombocytopenia -due infection    Hyponatremia- improved ?  Discussed the management with patient her daughters and care team

## 2021-06-28 NOTE — Progress Notes (Addendum)
Transesophageal Echocardiogram :  Indication: Bacteremia, rule out endocarditis Requesting/ordering  physician: Dr. Steva Ready  Procedure: Benzocaine spray x2 and 2 mls x 2 of viscous lidocaine were given orally to provide local anesthesia to the oropharynx. The patient was positioned supine on the left side, bite block provided. The patient was moderately sedated with the doses of versed and fentanyl as detailed below.  Using digital technique an omniplane probe was advanced into the distal esophagus without incident.   Moderate sedation: 1. Sedation used:  Versed: 3 mg, Fentanyl: 75 mcg 2. Time administered:    12:20 PM time when patient started recovery: 12:50 PM Total sedation time 30 minutes 3. I was face to face during this time  See report in EPIC  for complete details: In brief, Large freely mobile vegetation noted on pacer wire in the right atrium above the tricuspid valve, estimated at least 1 x 1.5 cm or greater.  See pictures below Mitral valve, aortic valve, pulmonic with no endocarditis Grossly there does not appear to be endocarditis on the tricuspid valve though this was not well visualized  transgastric imaging revealed normal LV function with no RWMAs and no mural apical thrombus.  .  Estimated ejection fraction was 55%.  Right sided cardiac chambers were normal with no evidence of pulmonary hypertension.  Mild to moderate MR  Imaging of the septum showed no ASD or VSD Bubble study was negative for shunt 2D and color flow confirmed no PFO  The LA was well visualized in orthogonal views.  There was no spontaneous contrast and no thrombus in the LA and LA appendage   The descending thoracic aorta had mild to moderate atheroma, no mural aortic debris with no evidence of aneurysmal dilation or disection   Debra Saunders 06/28/2021 1:13 PM     Media Information  \   Media Information     Media Information

## 2021-06-28 NOTE — Discharge Summary (Signed)
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 fib on Eliquis, CKD stage IIIa, cardiorenal syndrome, DVT on Eliquis presented to the ER with shortness of breath, cough fever and chills, dysuria and right upper quadrant abdominal pain for almost 3 weeks. In the emergency room initially hypotensive with blood pressure 73/26 that improved to 103/2:54 liters Ringer lactate bolus.  She was 87% on 4 L home oxygen.  Started on BiPAP for respiratory distress.  WBC count 20,000.  Hemoglobin 8.1-7.  COVID-19 negative.  INR 2.  FOBT negative.  Lactic acid 1.  Abnormal urinalysis.  Sodium 128-124.  Temperature 100.1.  Chest x-ray with multifocal infiltrate.  CT scan abdomen pelvis negative for any acute intra-abdominal issues.  Blood cultures subsequently growing gram-positive cocci with group G Streptococcus. Patient was found to have infected pacemaker lead with vegetation, transferring to [facility] for pacemaker explantation.   Severe sepsis with septic shock secondary to multifocal pneumonia.  Streptococcus bacteremia.  Klebsiella UTI. Infected pacemaker. Patient was initially treated with peripheral Levophed.  IV fluid resuscitation.  Blood pressures adequately stabilized.  Renal functions responding well. Will taper off stress dose of steroids. Initially on vancomycin  and cefepime, now changed to Rocephin .  Blood cultures 1/3, Streptococcus Blood cultures 1/5, no growth so far Patient has a pacemaker. TTE not diagnostic. TEE 1/6 consistent with infected pacemaker lead with vegetation.  Cardiology recommended explantation.  Patient will need to be transferred to [facility] and she will be seen by EP cardiology.  Dr. Adam Phenix aware about the patient.  Bronchodilators.  Chest physiotherapy.  Follow-up blood cultures, sputum cultures. Followed by infectious disease.   Acute on chronic hypoxemic respiratory failure due to multifocal pneumonia:  4 L oxygen in the daytime.  CPAP at night.  She is at her home settings.   Pulmonary hypertension: Uses sildenafil, macitentan. On hold due to hypotension.  Continue to hold.   Acute anemia: Suspected acute blood loss anemia. Baseline hemoglobin 9-presented with hemoglobin 8-dropped to 7.  1 unit PRBC given. Hemoglobin 8.1-7.7.  Patient anticoagulated with Eliquis.  Holding Eliquis.  IV Protonix.  Avoid NSAIDs. Close monitoring of hemoglobin. Seen by gastroenterology, not planning for any endoscopic evaluation. Planning conservative management. Hemoglobin is stable since initial drop.   Acute renal failure superimposed on stage IIIa chronic kidney disease/hyponatremia: Likely multifactorial including UTI, dehydration with continued sign of diuretics, ATN from hypotension.  Holding all diuretics.  On bicarbonate.  Followed by nephrology. Renal functions improving now.  Good urine output. Was seen by nephrology.   Essential hypertension: Blood pressure medications on hold.  We will continue to hold on transfer.   Hyperlipidemia: On Crestor continued.   Klebsiella UTI present on admission: On Rocephin.     Abnormal LFTs: Suspect shock liver.  No evidence of bile duct obstruction.  Improving.  Status postcholecystectomy.  Right upper quadrant ultrasound fairly stable without any evidence of  cholangitis. Liver function test improving.  Patient is stable to transfer to [facility], she can go to telemetry bed.  Patient will be transferred to [facility] hospitalist service with cardiology consultation.    Discharge Diagnoses:  Principal Problem:   Severe sepsis with septic shock ([place]) Active Problems:   Pulmonary hypertension, unspecified (HCC)   Acute on chronic respiratory failure with hypoxia (HCC)   Normocytic anemia   Interstitial lung disease (HCC)   UTI (urinary tract infection)   Atrial fibrillation (HCC)   Multifocal pneumonia   HTN (hypertension)   HLD (hyperlipidemia)   Chronic diastolic CHF (congestive heart failure) (HCC)   Acute renal failure superimposed on stage 3a chronic kidney disease (HCC)   DVT (deep venous thrombosis) (HCC)   Hyponatremia   Thrombocytopenia (HCC)   Diarrhea   Bacteremia due to Gram-positive bacteria   GI bleeding   Black stool   Septic shock [facility])    Discharge Instructions  Discharge Instructions     Diet - low sodium heart healthy   Complete by: As directed    Increase activity slowly   Complete by: As directed       Allergies as of 06/28/2021       Reactions   Aspirin Hives, Other (See Comments)   Pt states that she is unable to take because she had bleeding in her brain   Other Other (See Comments)   Pt states that she is unable to take blood thinners because she had bleeding in her brain; Blood Thinners-per doctor at [facility] (Xarelto)   Penicillins Hives, Shortness Of Breath, Swelling, Other (See Comments)   Has patient had a PCN reaction causing immediate rash, facial/tongue/throat swelling, SOB or lightheadedness with hypotension: Yes Has patient had a PCN reaction causing severe rash involving mucus membranes or skin necrosis: No Has patient had a PCN reaction that required hospitalization No Has patient had a PCN reaction occurring within the last 10 years: No If all of the above answers are "NO",  then may proceed with Cephalosporin use.        Medication List     STOP taking these medications    cholestyramine 4 GM/DOSE powder Commonly known as: QUESTRAN   digoxin 0.125 MG tablet Commonly known as: LANOXIN   Eliquis 5 MG Tabs tablet Generic drug: apixaban   Klor-Con M20 20 MEQ tablet Generic drug: potassium chloride SA   Lancets Misc   ondansetron 8 MG tablet Commonly known as: ZOFRAN   Opsumit 10 MG tablet Generic drug: macitentan   sildenafil 20 MG tablet Commonly known as: REVATIO   spironolactone 25 MG tablet Commonly known as: ALDACTONE   torsemide 20 MG tablet Commonly known as: DEMADEX   True Metrix Blood Glucose Test test strip Generic drug: glucose blood   Uptravi 1600 MCG Tabs Generic drug: Selexipag       TAKE these medications    acetaminophen 325 MG tablet Commonly known as: TYLENOL Take 325-650 mg by mouth every 6 (six) hours as needed for mild pain.   albuterol 0.63 MG/3ML nebulizer solution Commonly known as: ACCUNEB Take 1 ampule by nebulization every 6 (six) hours as needed for wheezing.   BIOFREEZE EX Apply 1 application topically 4 (four) times daily as needed (for pain).   Difluprednate 0.05 % Emul Place 1 drop into both eyes 2 (two) times daily.   DULoxetine 30 MG capsule Commonly known as: CYMBALTA TAKE 1 CAPSULE BY MOUTH ONCE DAILY FOR 14 DAYS, THEN INCREASE TO 2 CAPSULES ONCE DAILY What changed:  See the new instructions.   gabapentin 300 MG capsule Commonly known as: NEURONTIN Take 600 mg by mouth 3 (three) times daily. What changed: Another medication with the same name was removed. Continue taking this medication, and follow the directions you see here.   meclizine 25 MG tablet Commonly known as: ANTIVERT Take 1 tablet (25 mg total) by mouth 3 (three) times daily as needed for dizziness.   rosuvastatin 10 MG tablet Commonly known as: CRESTOR Take 1 tablet by mouth once daily        Allergies   Allergen Reactions   Aspirin Hives and Other (See Comments)    Pt states that she is unable to take because she had bleeding in her brain   Other Other (See Comments)    Pt states that she is unable to take blood thinners because she had bleeding in her brain; Blood Thinners-per doctor at [facility] (Xarelto)   Penicillins Hives, Shortness Of Breath, Swelling and Other (See Comments)    Has patient had a PCN reaction causing immediate rash, facial/tongue/throat swelling, SOB or lightheadedness with hypotension: Yes Has patient had a PCN reaction causing severe rash involving mucus membranes or skin necrosis: No Has patient had a PCN reaction that required hospitalization No Has patient had a PCN reaction occurring within the last 10 years: No If all of the above answers are "NO", then may proceed with Cephalosporin use.    Consultations: Infectious disease Cardiology   Procedures/Studies: CT ABDOMEN PELVIS WO CONTRAST  Result Date: 06/24/2021 CLINICAL DATA:  Abdominal pain, nonlocalized. Diarrhea loss of appetite for past few days. EXAM: CT ABDOMEN AND PELVIS WITHOUT CONTRAST TECHNIQUE: Multidetector CT imaging of the abdomen and pelvis was performed following the standard protocol without IV contrast. COMPARISON:  CT examination dated October 15, 2020 FINDINGS: Lower chest: No acute abnormality. Hepatobiliary: No focal liver abnormality is seen. Status post cholecystectomy. No biliary dilatation. Pancreas: Unremarkable. No pancreatic ductal dilatation or surrounding inflammatory changes. Spleen: Normal in size without focal abnormality. Adrenals/Urinary Tract: Adrenal glands are unremarkable. Kidneys are normal, without renal calculi, focal lesion, or hydronephrosis. Bladder is unremarkable. Stomach/Bowel: Stomach is within normal limits. Appendix not visualized. No evidence of bowel wall thickening, distention, or inflammatory changes. Colonic diverticulosis without evidence of acute  diverticulitis. Vascular/Lymphatic: Aortic atherosclerosis. No enlarged abdominal or pelvic lymph nodes. IVC filter is noted. Reproductive: Status post hysterectomy. Stable hypodense cystic structures in bilateral adnexal region. Other: No abdominal wall hernia or abnormality. No abdominopelvic ascites. Musculoskeletal: Degenerate disc disease of the lumbar spine with mild levoscoliosis. No acute osseous abnormality. IMPRESSION: 1. Sigmoid colonic diverticulosis without evidence of acute diverticulitis. 2. Bowel loops are normal in caliber without evidence of obstruction or significant wall thickening. 3. Status post cholecystectomy and hysterectomy. 4. 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 IVC filter is present. Electronically Signed   By: Keane Police D.O.   On: 06/24/2021 16:17   DG Chest Portable 1 View  Result Date: 06/25/2021 CLINICAL DATA:  Chest and back pain with diarrhea. EXAM: PORTABLE CHEST 1 VIEW COMPARISON:  01/15/2021 FINDINGS: 0459 hours. Stable asymmetric elevation right hemidiaphragm. Diffuse interstitial opacity again noted with new areas of patchy airspace disease in the right upper lobe and medial right base. No substantial pleural effusion. Left-sided permanent pacemaker again noted. Telemetry leads overlie the chest. IMPRESSION: Interval development of patchy airspace disease in the right upper lobe and medial right base. Multifocal pneumonia concern. Electronically Signed   By: Verda Cumins.D.  On: 06/25/2021 05:23   ECHOCARDIOGRAM COMPLETE  Result Date: 06/27/2021    ECHOCARDIOGRAM REPORT   Patient Name:   MARYLON VERNO Date of Exam: 06/26/2021 Medical Rec #:  [id number]        Height:       67.0 in Accession #:    [id number]       Weight:       192.0 lb Date of Birth:  1946-02-29         BSA:          1.988 m Patient Age:    19 years         BP:           139/44 mmHg Patient Gender: F                HR:           87 bpm. Exam Location:  [facility] Procedure: 2D  Echo, Cardiac Doppler and Color Doppler Indications:     R78.81 Bacteremia  History:         Patient has prior history of Echocardiogram examinations, most                  recent 09/12/2019. Cardiomyopathy, Stroke, Arrythmias:Atrial                  Fibrillation; Risk Factors:Hypertension, Dyslipidemia, Diabetes                  and Sleep Apnea.  Sonographer:     Cresenciano Lick RDCS Referring Phys:  [id number] Barb Merino Diagnosing Phys: Ida Rogue MD IMPRESSIONS  1. Unable to exclude valve endocarditis as detailed below.  2. Left ventricular ejection fraction, by estimation, is 60 to 65%. The left ventricle has normal function. The left ventricle has no regional wall motion abnormalities. There is mild left ventricular hypertrophy. Left ventricular diastolic parameters are indeterminate.  3. Right ventricular systolic function is normal. The right ventricular size is normal. There is moderately elevated pulmonary artery systolic pressure. The estimated right ventricular systolic pressure is 24.2 mmHg.  4. The mitral valve is normal in structure. Mild to moderate mitral valve regurgitation. No evidence of mitral stenosis.  5. Tricuspid valve regurgitation is moderate. Thickening of the tricuspid leaflets, unable to exclude vegetation.  6. The aortic valve is normal in structure. There is moderate calcification of the aortic valve. There is moderate thickening of the aortic valve. Aortic valve regurgitation is not visualized. valve thickening appears worse compared to study in 2021, unable to exclude vegetation.  7. Aortic valve sclerosis/calcification is present, without any evidence of aortic stenosis.  8. The inferior vena cava is normal in size with greater than 50% respiratory variability, suggesting right atrial pressure of 3 mmHg. FINDINGS  Left Ventricle: Left ventricular ejection fraction, by estimation, is 60 to 65%. The left ventricle has normal function. The left ventricle has no regional wall  motion abnormalities. The left ventricular internal cavity size was normal in size. There is  mild left ventricular hypertrophy. Left ventricular diastolic parameters are indeterminate. Right Ventricle: The right ventricular size is normal. No increase in right ventricular wall thickness. Right ventricular systolic function is normal. There is moderately elevated pulmonary artery systolic pressure. The tricuspid regurgitant velocity is 3.41 m/s, and with an assumed right atrial pressure of 5 mmHg, the estimated right ventricular systolic pressure is 35.3 mmHg. Left Atrium: Left atrial size was normal in size. Right Atrium: Right atrial size was normal in size.  Pericardium: There is no evidence of pericardial effusion. Mitral Valve: The mitral valve is normal in structure. Mild to moderate mitral valve regurgitation. No evidence of mitral valve stenosis. Tricuspid Valve: The tricuspid valve is normal in structure. Tricuspid valve regurgitation is moderate . No evidence of tricuspid stenosis. Aortic Valve: The aortic valve is normal in structure. There is moderate calcification of the aortic valve. There is moderate thickening of the aortic valve. Aortic valve regurgitation is not visualized. Aortic valve sclerosis/calcification is present, without any evidence of aortic stenosis. Aortic valve mean gradient measures 10.5 mmHg. Aortic valve peak gradient measures 18.4 mmHg. Aortic valve area, by VTI measures 1.73 cm. Pulmonic Valve: The pulmonic valve was normal in structure. Pulmonic valve regurgitation is mild. No evidence of pulmonic stenosis. Aorta: The aortic root is normal in size and structure. Venous: The inferior vena cava is normal in size with greater than 50% respiratory variability, suggesting right atrial pressure of 3 mmHg. IAS/Shunts: No atrial level shunt detected by color flow Doppler. Additional Comments: A device lead is visualized.  LEFT VENTRICLE PLAX 2D LVIDd:         5.00 cm   Diastology LVIDs:          2.80 cm   LV e' medial:    9.25 cm/s LV PW:         1.20 cm   LV E/e' medial:  14.9 LV IVS:        1.20 cm   LV e' lateral:   7.83 cm/s LVOT diam:     1.90 cm   LV E/e' lateral: 17.6 LV SV:         62 LV SV Index:   31 LVOT Area:     2.84 cm  RIGHT VENTRICLE             IVC RV Basal diam:  3.80 cm     IVC diam: 1.80 cm RV S prime:     18.47 cm/s TAPSE (M-mode): 2.2 cm LEFT ATRIUM             Index        RIGHT ATRIUM           Index LA diam:        4.00 cm 2.01 cm/m   RA Area:     17.40 cm LA Vol (A2C):   75.5 ml 37.99 ml/m  RA Volume:   48.20 ml  24.25 ml/m LA Vol (A4C):   79.3 ml 39.90 ml/m LA Biplane Vol: 78.5 ml 39.50 ml/m  AORTIC VALVE AV Area (Vmax):    1.71 cm AV Area (Vmean):   1.72 cm AV Area (VTI):     1.73 cm AV Vmax:           214.50 cm/s AV Vmean:          152.250 cm/s AV VTI:            0.356 m AV Peak Grad:      18.4 mmHg AV Mean Grad:      10.5 mmHg LVOT Vmax:         129.00 cm/s LVOT Vmean:        92.350 cm/s LVOT VTI:          0.217 m LVOT/AV VTI ratio: 0.61  AORTA Ao Root diam: 2.80 cm Ao Asc diam:  3.50 cm MV E velocity: 137.67 cm/s  TRICUSPID VALVE  TR Peak grad:   46.5 mmHg                             TR Vmax:        341.00 cm/s                              SHUNTS                             Systemic VTI:  0.22 m                             Systemic Diam: 1.90 cm Ida Rogue MD Electronically signed by Ida Rogue MD Signature Date/Time: 06/27/2021/10:07:07 AM    Final    CUP PACEART REMOTE DEVICE CHECK  Result Date: 05/30/2021 Scheduled remote reviewed. Normal device function.  2 NSVT, each 4 beats 2 ATR's, brief PAT Next remote 91 days. LR  US Abdomen Limited RUQ (LIVER/GB)  Result Date: 06/26/2021 CLINICAL DATA:  Right upper quadrant pain. EXAM: ULTRASOUND ABDOMEN LIMITED RIGHT UPPER QUADRANT COMPARISON:  CT abdomen/pelvis 06/24/2021. FINDINGS: Gallbladder: Cholecystectomy. Common bile duct: Diameter: 5 mm, within normal limits. Liver:  No focal lesion identified. Within normal limits in parenchymal echogenicity. Portal vein is patent on color Doppler imaging with normal direction of blood flow towards the liver. IMPRESSION: Cholecystectomy.  No evidence of acute abnormality. Electronically Signed   By: Margaretha Sheffield M.D.   On: 06/26/2021 09:28   (Echo, Carotid, EGD, Colonoscopy, ERCP)    Subjective: I have seen patient in the morning before going to procedure.  Persistent mild to moderate right upper quadrant abdominal pain and pleuritic pain on deep breathing otherwise no other complaints. TEE results were notified to me, I also notified patient's daughter and initiated transfer.   Discharge Exam: Vitals:   06/28/21 1345 06/28/21 1400  BP: (!) 129/58 (!) 124/52  Pulse: 70 69  Resp: 20 (!) 24  Temp:    SpO2: 96% 94%   Vitals:   06/28/21 1315 06/28/21 1330 06/28/21 1345 06/28/21 1400  BP: (!) 113/47 (!) 123/47 (!) 129/58 (!) 124/52  Pulse: 85 70 70 69  Resp: (!) 24 (!) 23 20 (!) 24  Temp:      TempSrc:      SpO2: 93% 94% 96% 94%  Weight:       General exam: Appears calm and comfortable.  Slightly anxious. Frail and debilitated. Not in any distress.  Currently on 4 L oxygen. She is chronically sick looking.  Weaned off CPAP that she uses for sleep apnea. Respiratory system: Some conducted airway sounds.  No added sounds. Cardiovascular system: S1 & S2 heard, RRR.  Pacemaker in place.   Gastrointestinal system: Mildly tender right upper quadrant.  No rigidity or guarding.  Bowel sound present. Central nervous system: Alert and oriented. No focal neurological deficits. Extremities: Symmetric 5 x 5 power. Skin: No rashes, lesions or ulcers Psychiatry: Judgement and insight appear normal. Mood & affect appropriate.  Chronic asymmetrical edema left lower extremity.  Nonpitting.    The results of significant diagnostics from this hospitalization (including imaging, microbiology, ancillary and laboratory) are  listed below for reference.     Microbiology: Recent Results (from the past 240 hour(s))  Blood culture (routine x 2)     Status:  Abnormal   Collection Time: 06/25/21  2:33 AM   Specimen: BLOOD RIGHT FOREARM  Result Value Ref Range Status   Specimen Description   Final    BLOOD RIGHT FOREARM Performed at [facility], [address] [place], [place] [address]    Special Requests   Final    BOTTLES DRAWN AEROBIC AND ANAEROBIC Blood Culture results may not be optimal due to an inadequate volume of blood received in culture bottles Performed at [facility], [address] [place], [place] [address]    Culture  Setup Time   Final    GRAM POSITIVE COCCI IN BOTH AEROBIC AND ANAEROBIC BOTTLES Organism ID to follow CRITICAL RESULT CALLED TO, READ BACK BY AND VERIFIED WITHArdeen Garland PHARMD [id number] 06/25/21 HNM Performed at [facility], [address] [place], [place] [address]    Culture STREPTOCOCCUS GROUP G (A)  Final   Report Status 06/28/2021 FINAL  Final   Organism ID, Bacteria STREPTOCOCCUS GROUP G  Final      Susceptibility   Streptococcus group g - MIC*    CLINDAMYCIN RESISTANT Resistant     AMPICILLIN <=0.25 SENSITIVE Sensitive     ERYTHROMYCIN 2 RESISTANT Resistant     VANCOMYCIN 0.5 SENSITIVE Sensitive     CEFTRIAXONE <=0.12 SENSITIVE Sensitive     LEVOFLOXACIN 0.5 SENSITIVE Sensitive     * STREPTOCOCCUS GROUP G  Blood Culture ID Panel (Reflexed)     Status: Abnormal   Collection Time: 06/25/21  2:33 AM  Result Value Ref Range Status   Enterococcus faecalis NOT DETECTED NOT DETECTED Final   Enterococcus Faecium NOT DETECTED NOT DETECTED Final   Listeria monocytogenes NOT DETECTED NOT DETECTED Final   Staphylococcus species NOT DETECTED NOT DETECTED Final   Staphylococcus aureus (BCID) NOT DETECTED NOT DETECTED Final   Staphylococcus epidermidis NOT DETECTED NOT DETECTED Final   Staphylococcus lugdunensis NOT DETECTED NOT DETECTED Final    Streptococcus species DETECTED (A) NOT DETECTED Final    Comment: Not Enterococcus species, Streptococcus agalactiae, Streptococcus pyogenes, or Streptococcus pneumoniae. CRITICAL RESULT CALLED TO, READ BACK BY AND VERIFIED WITH: [place] [id number] 06/25/21 HNM    Streptococcus agalactiae NOT DETECTED NOT DETECTED Final   Streptococcus pneumoniae NOT DETECTED NOT DETECTED Final   Streptococcus pyogenes NOT DETECTED NOT DETECTED Final   A.calcoaceticus-baumannii NOT DETECTED NOT DETECTED Final   Bacteroides fragilis NOT DETECTED NOT DETECTED Final   Enterobacterales NOT DETECTED NOT DETECTED Final   Enterobacter cloacae complex NOT DETECTED NOT DETECTED Final   Escherichia coli NOT DETECTED NOT DETECTED Final   Klebsiella aerogenes NOT DETECTED NOT DETECTED Final   Klebsiella oxytoca NOT DETECTED NOT DETECTED Final   Klebsiella pneumoniae NOT DETECTED NOT DETECTED Final   Proteus species NOT DETECTED NOT DETECTED Final   Salmonella species NOT DETECTED NOT DETECTED Final   Serratia marcescens NOT DETECTED NOT DETECTED Final   Haemophilus influenzae NOT DETECTED NOT DETECTED Final   Neisseria meningitidis NOT DETECTED NOT DETECTED Final   Pseudomonas aeruginosa NOT DETECTED NOT DETECTED Final   Stenotrophomonas maltophilia NOT DETECTED NOT DETECTED Final   Candida albicans NOT DETECTED NOT DETECTED Final   Candida auris NOT DETECTED NOT DETECTED Final   Candida glabrata NOT DETECTED NOT DETECTED Final   Candida krusei NOT DETECTED NOT DETECTED Final   Candida parapsilosis NOT DETECTED NOT DETECTED Final   Candida tropicalis NOT DETECTED NOT DETECTED Final   Cryptococcus neoformans/gattii NOT DETECTED NOT DETECTED Final    Comment:  Performed at [facility], [address] [place], [place] [address]  Resp Panel by RT-PCR (Flu A&B, Covid) Nasopharyngeal Swab     Status: None   Collection Time: 06/25/21  3:33 AM   Specimen: Nasopharyngeal Swab; Nasopharyngeal(NP) swabs  in vial transport medium  Result Value Ref Range Status   SARS Coronavirus 2 by RT PCR NEGATIVE NEGATIVE Final    Comment: (NOTE) SARS-CoV-2 target nucleic acids are NOT DETECTED.  The SARS-CoV-2 RNA is generally detectable in upper respiratory specimens during the acute phase of infection. The lowest concentration of SARS-CoV-2 viral copies this assay can detect is 138 copies/mL. A negative result does not preclude SARS-Cov-2 infection and should not be used as the sole basis for treatment or other patient management decisions. A negative result may occur with  improper specimen collection/handling, submission of specimen other than nasopharyngeal swab, presence of viral mutation(s) within the areas targeted by this assay, and inadequate number of viral copies(<138 copies/mL). A negative result must be combined with clinical observations, patient history, and epidemiological information. The expected result is Negative.  Fact Sheet for Patients:  EntrepreneurPulse.com.au  Fact Sheet for Healthcare Providers:  IncredibleEmployment.be  This test is no t yet approved or cleared by the [place] FDA and  has been authorized for detection and/or diagnosis of SARS-CoV-2 by FDA under an Emergency Use Authorization (EUA). This EUA will remain  in effect (meaning this test can be used) for the duration of the COVID-19 declaration under Section 564(b)(1) of the Act, 21 U.S.C.section 360bbb-3(b)(1), unless the authorization is terminated  or revoked sooner.       Influenza A by PCR NEGATIVE NEGATIVE Final   Influenza B by PCR NEGATIVE NEGATIVE Final    Comment: (NOTE) The Xpert Xpress SARS-CoV-2/FLU/RSV plus assay is intended as an aid in the diagnosis of influenza from Nasopharyngeal swab specimens and should not be used as a sole basis for treatment. Nasal washings and aspirates are unacceptable for Xpert Xpress  SARS-CoV-2/FLU/RSV testing.  Fact Sheet for Patients: EntrepreneurPulse.com.au  Fact Sheet for Healthcare Providers: IncredibleEmployment.be  This test is not yet approved or cleared by the [place] FDA and has been authorized for detection and/or diagnosis of SARS-CoV-2 by FDA under an Emergency Use Authorization (EUA). This EUA will remain in effect (meaning this test can be used) for the duration of the COVID-19 declaration under Section 564(b)(1) of the Act, 21 U.S.C. section 360bbb-3(b)(1), unless the authorization is terminated or revoked.  Performed at [facility], [address] [place], [place] [address]   Blood culture (routine x 2)     Status: Abnormal   Collection Time: 06/25/21  5:55 AM   Specimen: BLOOD  Result Value Ref Range Status   Specimen Description   Final    BLOOD LEFT ARM Performed at [facility], [address] [place], [place] [address]    Special Requests   Final    BOTTLES DRAWN AEROBIC AND ANAEROBIC Blood Culture adequate volume Performed at [facility], [address] [place], [place] [address]    Culture  Setup Time   Final    GRAM POSITIVE COCCI IN BOTH AEROBIC AND ANAEROBIC BOTTLES CRITICAL VALUE NOTED.  VALUE IS CONSISTENT WITH PREVIOUSLY REPORTED AND CALLED VALUE. Performed at [facility], [address] [place], [place] [address]    Culture (A)  Final    STREPTOCOCCUS GROUP G SUSCEPTIBILITIES PERFORMED ON PREVIOUS CULTURE WITHIN THE LAST 5 DAYS. Performed at [facility], [address], [place], [place] [address]    Report Status 06/28/2021 FINAL  Final  Urine Culture     Status: Abnormal   Collection Time: 06/25/21  6:55 AM   Specimen: Urine, Clean Catch  Result Value Ref Range Status   Specimen Description   Final    URINE, CLEAN CATCH Performed at [facility], [address] [place], [place] [address]    Special Requests    Final    NONE Performed at [facility], [place], [place] [address]    Culture >=100,000 COLONIES/mL KLEBSIELLA PNEUMONIAE (A)  Final   Report Status 06/27/2021 FINAL  Final   Organism ID, Bacteria KLEBSIELLA PNEUMONIAE (A)  Final      Susceptibility   Klebsiella pneumoniae - MIC*    AMPICILLIN RESISTANT Resistant     CEFAZOLIN <=4 SENSITIVE Sensitive     CEFEPIME <=0.12 SENSITIVE Sensitive     CEFTRIAXONE <=0.25 SENSITIVE Sensitive     CIPROFLOXACIN <=0.25 SENSITIVE Sensitive     GENTAMICIN <=1 SENSITIVE Sensitive     IMIPENEM <=0.25 SENSITIVE Sensitive     NITROFURANTOIN 32 SENSITIVE Sensitive     TRIMETH/SULFA <=20 SENSITIVE Sensitive     AMPICILLIN/SULBACTAM <=2 SENSITIVE Sensitive     PIP/TAZO <=4 SENSITIVE Sensitive     * >=100,000 COLONIES/mL KLEBSIELLA PNEUMONIAE  C Difficile Quick Screen w PCR reflex     Status: None   Collection Time: 06/25/21  6:06 PM   Specimen: Stool  Result Value Ref Range Status   C Diff antigen NEGATIVE NEGATIVE Final   C Diff toxin NEGATIVE NEGATIVE Final   C Diff interpretation No C. difficile detected.  Final    Comment: Performed at [facility], [address] [place], [place] [address]  MRSA Next Gen by PCR, Nasal     Status: None   Collection Time: 06/25/21 10:55 PM   Specimen: Nasal Mucosa; Nasal Swab  Result Value Ref Range Status   MRSA by PCR Next Gen NOT DETECTED NOT DETECTED Final    Comment: (NOTE) [name] (FDA approved for NASAL specimens only), is one component of a comprehensive MRSA colonization surveillance program. It is not intended to diagnose MRSA infection nor to guide or monitor treatment for MRSA infections. Test performance is not FDA approved in patients less than 45 years old. Performed at [facility], [address] [place], [place] [address]   Culture, blood (Routine X 2) w Reflex to ID Panel     Status: None (Preliminary result)   Collection  Time: 06/27/21  8:29 AM   Specimen: BLOOD RIGHT HAND  Result Value Ref Range Status   Specimen Description BLOOD RIGHT HAND  Final   Special Requests   Final    BOTTLES DRAWN AEROBIC AND ANAEROBIC Blood Culture adequate volume   Culture   Final    NO GROWTH < 24 HOURS Performed at [facility], [address] [place], [place] [address]    Report Status PENDING  Incomplete  Culture, blood (Routine X 2) w Reflex to ID Panel     Status: None (Preliminary result)   Collection Time: 06/27/21  8:29 AM   Specimen: BLOOD RIGHT HAND  Result Value Ref Range Status   Specimen Description BLOOD RIGHT HAND  Final   Special Requests   Final    BOTTLES DRAWN AEROBIC ONLY Blood Culture adequate volume   Culture   Final    NO GROWTH <  24 HOURS Performed at [facility], [place], [place] [address]    Report Status PENDING  Incomplete     Labs: BNP (last 3 results) Recent Labs    08/24/20 1506 03/27/21 1530 06/25/21 1350  BNP 51.2 31.1 809.9*   Basic Metabolic Panel: Recent Labs  Lab 06/25/21 2243 06/26/21 0847 06/27/21 0828 06/27/21 1749 06/28/21 0500  NA 124* 124* 129* 131* 134*  K 5.1 5.0 4.5 4.5 3.8  CL 98 95* 95* 97* 100  CO2 15* 18* 24 25 30   GLUCOSE 201* 224* 227* 257* 242*  BUN 83* 96* 90* 84* 76*  CREATININE 3.21* 3.02* 2.05* 1.68* 1.46*  CALCIUM 8.0* 7.7* 7.2* 7.3* 7.5*   Liver Function Tests: Recent Labs  Lab 06/24/21 1456 06/25/21 1350 06/26/21 0847  AST 19 21 16   ALT 12 13 11   ALKPHOS 128* 126 137*  BILITOT 2.7* 3.0* 1.9*  PROT 6.3* 6.2* 6.6  ALBUMIN 2.5* 2.2* 2.3*   Recent Labs  Lab 06/24/21 1456  LIPASE 45   No results for input(s): AMMONIA in the last 168 hours. CBC: Recent Labs  Lab 06/24/21 1456 06/25/21 1350 06/25/21 2243 06/26/21 0847 06/28/21 0500  WBC 20.1* 18.3* 22.8* 14.3* 9.3  NEUTROABS  --   --   --   --  7.3  HGB 8.1* 7.0* 8.2* 8.2* 7.7*  HCT 25.0* 22.0* 25.3* 25.8* 23.4*  MCV 85.0 85.9 85.5  84.3 83.9  PLT 99* 75* 87* 93* 118*   Cardiac Enzymes: No results for input(s): CKTOTAL, CKMB, CKMBINDEX, TROPONINI in the last 168 hours. BNP: Invalid input(s): POCBNP CBG: Recent Labs  Lab 06/26/21 0747 06/27/21 0728 06/28/21 0747 06/28/21 1211  GLUCAP 212* 218* 203* 190*   D-Dimer No results for input(s): DDIMER in the last 72 hours. Hgb A1c No results for input(s): HGBA1C in the last 72 hours. Lipid Profile Recent Labs    06/26/21 0847  CHOL 89  HDL <10*  LDLCALC NOT CALCULATED  TRIG 132  CHOLHDL NOT CALCULATED   Thyroid function studies No results for input(s): TSH, T4TOTAL, T3FREE, THYROIDAB in the last 72 hours.  Invalid input(s): FREET3 Anemia work up No results for input(s): VITAMINB12, FOLATE, FERRITIN, TIBC, IRON, RETICCTPCT in the last 72 hours. Urinalysis    Component Value Date/Time   COLORURINE AMBER (A) 06/25/2021 0610   APPEARANCEUR CLOUDY (A) 06/25/2021 0610   APPEARANCEUR Clear 11/18/2013 0443   LABSPEC 1.016 06/25/2021 0610   LABSPEC 1.049 11/18/2013 0443   PHURINE 5.0 06/25/2021 0610   GLUCOSEU NEGATIVE 06/25/2021 0610   GLUCOSEU Negative 11/18/2013 0443   HGBUR NEGATIVE 06/25/2021 0610   BILIRUBINUR NEGATIVE 06/25/2021 0610   BILIRUBINUR neg 11/04/2019 1526   BILIRUBINUR Negative 11/18/2013 0443   KETONESUR NEGATIVE 06/25/2021 0610   PROTEINUR 30 (A) 06/25/2021 0610   UROBILINOGEN 0.2 11/04/2019 1526   NITRITE NEGATIVE 06/25/2021 0610   LEUKOCYTESUR SMALL (A) 06/25/2021 0610   LEUKOCYTESUR Negative 11/18/2013 0443   Sepsis Labs Invalid input(s): PROCALCITONIN,  WBC,  LACTICIDVEN Microbiology Recent Results (from the past 240 hour(s))  Blood culture (routine x 2)     Status: Abnormal   Collection Time: 06/25/21  2:33 AM   Specimen: BLOOD RIGHT FOREARM  Result Value Ref Range Status   Specimen Description   Final    BLOOD RIGHT FOREARM Performed at [facility], [address], [place], [place] [address]    Special  Requests   Final    BOTTLES DRAWN AEROBIC AND ANAEROBIC Blood  Culture results may not be optimal due to an inadequate volume of blood received in culture bottles Performed at [facility], [address] [place], [place] [address]    Culture  Setup Time   Final    GRAM POSITIVE COCCI IN BOTH AEROBIC AND ANAEROBIC BOTTLES Organism ID to follow CRITICAL RESULT CALLED TO, READ BACK BY AND VERIFIED WITHArdeen Garland PHARMD 1425 06/25/21 HNM Performed at [facility], [address] [place], [place] [address]    Culture STREPTOCOCCUS GROUP G (A)  Final   Report Status 06/28/2021 FINAL  Final   Organism ID, Bacteria STREPTOCOCCUS GROUP G  Final      Susceptibility   Streptococcus group g - MIC*    CLINDAMYCIN RESISTANT Resistant     AMPICILLIN <=0.25 SENSITIVE Sensitive     ERYTHROMYCIN 2 RESISTANT Resistant     VANCOMYCIN 0.5 SENSITIVE Sensitive     CEFTRIAXONE <=0.12 SENSITIVE Sensitive     LEVOFLOXACIN 0.5 SENSITIVE Sensitive     * STREPTOCOCCUS GROUP G  Blood Culture ID Panel (Reflexed)     Status: Abnormal   Collection Time: 06/25/21  2:33 AM  Result Value Ref Range Status   Enterococcus faecalis NOT DETECTED NOT DETECTED Final   Enterococcus Faecium NOT DETECTED NOT DETECTED Final   Listeria monocytogenes NOT DETECTED NOT DETECTED Final   Staphylococcus species NOT DETECTED NOT DETECTED Final   Staphylococcus aureus (BCID) NOT DETECTED NOT DETECTED Final   Staphylococcus epidermidis NOT DETECTED NOT DETECTED Final   Staphylococcus lugdunensis NOT DETECTED NOT DETECTED Final   Streptococcus species DETECTED (A) NOT DETECTED Final    Comment: Not Enterococcus species, Streptococcus agalactiae, Streptococcus pyogenes, or Streptococcus pneumoniae. CRITICAL RESULT CALLED TO, READ BACK BY AND VERIFIED WITH: [place] [id number] 06/25/21 HNM    Streptococcus agalactiae NOT DETECTED NOT DETECTED Final   Streptococcus pneumoniae NOT DETECTED NOT DETECTED Final    Streptococcus pyogenes NOT DETECTED NOT DETECTED Final   A.calcoaceticus-baumannii NOT DETECTED NOT DETECTED Final   Bacteroides fragilis NOT DETECTED NOT DETECTED Final   Enterobacterales NOT DETECTED NOT DETECTED Final   Enterobacter cloacae complex NOT DETECTED NOT DETECTED Final   Escherichia coli NOT DETECTED NOT DETECTED Final   Klebsiella aerogenes NOT DETECTED NOT DETECTED Final   Klebsiella oxytoca NOT DETECTED NOT DETECTED Final   Klebsiella pneumoniae NOT DETECTED NOT DETECTED Final   Proteus species NOT DETECTED NOT DETECTED Final   Salmonella species NOT DETECTED NOT DETECTED Final   Serratia marcescens NOT DETECTED NOT DETECTED Final   Haemophilus influenzae NOT DETECTED NOT DETECTED Final   Neisseria meningitidis NOT DETECTED NOT DETECTED Final   Pseudomonas aeruginosa NOT DETECTED NOT DETECTED Final   Stenotrophomonas maltophilia NOT DETECTED NOT DETECTED Final   Candida albicans NOT DETECTED NOT DETECTED Final   Candida auris NOT DETECTED NOT DETECTED Final   Candida glabrata NOT DETECTED NOT DETECTED Final   Candida krusei NOT DETECTED NOT DETECTED Final   Candida parapsilosis NOT DETECTED NOT DETECTED Final   Candida tropicalis NOT DETECTED NOT DETECTED Final   Cryptococcus neoformans/gattii NOT DETECTED NOT DETECTED Final    Comment: Performed at [facility], [address] [place], [place] [address]  Resp Panel by RT-PCR (Flu A&B, Covid) Nasopharyngeal Swab     Status: None   Collection Time: 06/25/21  3:33 AM   Specimen: Nasopharyngeal Swab; Nasopharyngeal(NP) swabs in vial transport medium  Result Value Ref Range Status   SARS Coronavirus 2 by RT PCR NEGATIVE NEGATIVE  Final    Comment: (NOTE) SARS-CoV-2 target nucleic acids are NOT DETECTED.  The SARS-CoV-2 RNA is generally detectable in upper respiratory specimens during the acute phase of infection. The lowest concentration of SARS-CoV-2 viral copies this assay can detect is 138 copies/mL. A  negative result does not preclude SARS-Cov-2 infection and should not be used as the sole basis for treatment or other patient management decisions. A negative result may occur with  improper specimen collection/handling, submission of specimen other than nasopharyngeal swab, presence of viral mutation(s) within the areas targeted by this assay, and inadequate number of viral copies(<138 copies/mL). A negative result must be combined with clinical observations, patient history, and epidemiological information. The expected result is Negative.  Fact Sheet for Patients:  EntrepreneurPulse.com.au  Fact Sheet for Healthcare Providers:  IncredibleEmployment.be  This test is no t yet approved or cleared by the [place] FDA and  has been authorized for detection and/or diagnosis of SARS-CoV-2 by FDA under an Emergency Use Authorization (EUA). This EUA will remain  in effect (meaning this test can be used) for the duration of the COVID-19 declaration under Section 564(b)(1) of the Act, 21 U.S.C.section 360bbb-3(b)(1), unless the authorization is terminated  or revoked sooner.       Influenza A by PCR NEGATIVE NEGATIVE Final   Influenza B by PCR NEGATIVE NEGATIVE Final    Comment: (NOTE) The Xpert Xpress SARS-CoV-2/FLU/RSV plus assay is intended as an aid in the diagnosis of influenza from Nasopharyngeal swab specimens and should not be used as a sole basis for treatment. Nasal washings and aspirates are unacceptable for Xpert Xpress SARS-CoV-2/FLU/RSV testing.  Fact Sheet for Patients: EntrepreneurPulse.com.au  Fact Sheet for Healthcare Providers: IncredibleEmployment.be  This test is not yet approved or cleared by the [place] FDA and has been authorized for detection and/or diagnosis of SARS-CoV-2 by FDA under an Emergency Use Authorization (EUA). This EUA will remain in effect (meaning this test can  be used) for the duration of the COVID-19 declaration under Section 564(b)(1) of the Act, 21 U.S.C. section 360bbb-3(b)(1), unless the authorization is terminated or revoked.  Performed at [facility], [address] [place], [place] [address]   Blood culture (routine x 2)     Status: Abnormal   Collection Time: 06/25/21  5:55 AM   Specimen: BLOOD  Result Value Ref Range Status   Specimen Description   Final    BLOOD LEFT ARM Performed at [facility], [address] [place], [place] [address]    Special Requests   Final    BOTTLES DRAWN AEROBIC AND ANAEROBIC Blood Culture adequate volume Performed at [facility], [address] [place], [place] [address]    Culture  Setup Time   Final    GRAM POSITIVE COCCI IN BOTH AEROBIC AND ANAEROBIC BOTTLES CRITICAL VALUE NOTED.  VALUE IS CONSISTENT WITH PREVIOUSLY REPORTED AND CALLED VALUE. Performed at [facility], [address] [place], [place] [address]    Culture (A)  Final    STREPTOCOCCUS GROUP G SUSCEPTIBILITIES PERFORMED ON PREVIOUS CULTURE WITHIN THE LAST 5 DAYS. Performed at [facility], [address], [place], [place] [address]    Report Status 06/28/2021 FINAL  Final  Urine Culture     Status: Abnormal   Collection Time: 06/25/21  6:55 AM   Specimen: Urine, Clean Catch  Result Value Ref Range Status   Specimen Description   Final    URINE, CLEAN CATCH Performed at [facility], [address] [place], [place] [address]    Special Requests   Final    NONE Performed at [facility], [place], [place] [address]    Culture >=100,000 COLONIES/mL KLEBSIELLA PNEUMONIAE (A)  Final   Report Status 06/27/2021 FINAL  Final   Organism ID, Bacteria KLEBSIELLA PNEUMONIAE (A)  Final      Susceptibility   Klebsiella pneumoniae - MIC*    AMPICILLIN RESISTANT Resistant     CEFAZOLIN <=4 SENSITIVE Sensitive     CEFEPIME <=0.12 SENSITIVE  Sensitive     CEFTRIAXONE <=0.25 SENSITIVE Sensitive     CIPROFLOXACIN <=0.25 SENSITIVE Sensitive     GENTAMICIN <=1 SENSITIVE Sensitive     IMIPENEM <=0.25 SENSITIVE Sensitive     NITROFURANTOIN 32 SENSITIVE Sensitive     TRIMETH/SULFA <=20 SENSITIVE Sensitive     AMPICILLIN/SULBACTAM <=2 SENSITIVE Sensitive     PIP/TAZO <=4 SENSITIVE Sensitive     * >=100,000 COLONIES/mL KLEBSIELLA PNEUMONIAE  C Difficile Quick Screen w PCR reflex     Status: None   Collection Time: 06/25/21  6:06 PM   Specimen: Stool  Result Value Ref Range Status   C Diff antigen NEGATIVE NEGATIVE Final   C Diff toxin NEGATIVE NEGATIVE Final   C Diff interpretation No C. difficile detected.  Final    Comment: Performed at [facility], [address] [place], [place] [address]  MRSA Next Gen by PCR, Nasal     Status: None   Collection Time: 06/25/21 10:55 PM   Specimen: Nasal Mucosa; Nasal Swab  Result Value Ref Range Status   MRSA by PCR Next Gen NOT DETECTED NOT DETECTED Final    Comment: (NOTE) [name] (FDA approved for NASAL specimens only), is one component of a comprehensive MRSA colonization surveillance program. It is not intended to diagnose MRSA infection nor to guide or monitor treatment for MRSA infections. Test performance is not FDA approved in patients less than 12 years old. Performed at [facility], [address] [place], [place] [address]   Culture, blood (Routine X 2) w Reflex to ID Panel     Status: None (Preliminary result)   Collection Time: 06/27/21  8:29 AM   Specimen: BLOOD RIGHT HAND  Result Value Ref Range Status   Specimen Description BLOOD RIGHT HAND  Final   Special Requests   Final    BOTTLES DRAWN AEROBIC AND ANAEROBIC Blood Culture adequate volume   Culture   Final    NO GROWTH < 24 HOURS Performed at [facility], [address] [place], [place] [address]    Report Status PENDING  Incomplete  Culture, blood (Routine X  2) w Reflex to ID Panel     Status: None (Preliminary result)   Collection Time: 06/27/21  8:29 AM   Specimen: BLOOD RIGHT HAND  Result Value Ref Range Status   Specimen Description BLOOD RIGHT HAND  Final   Special Requests   Final    BOTTLES DRAWN AEROBIC ONLY Blood Culture adequate volume   Culture   Final    NO GROWTH < 24 HOURS Performed at [facility], [address] [place], [place] [address]    Report Status PENDING  Incomplete     Time coordinating discharge: 40 minutes  SIGNED:   Barb Merino, MD  [facility] 06/28/2021, 2:34 PM

## 2021-06-29 ENCOUNTER — Inpatient Hospital Stay (HOSPITAL_COMMUNITY)
Admission: AD | Admit: 2021-06-29 | Discharge: 2021-07-13 | DRG: 314 | Disposition: A | Discharge status: Rehabilitation Facility | Payer: Medicare HMO | Source: Other Acute Inpatient Hospital | Attending: Internal Medicine | Admitting: Internal Medicine

## 2021-06-29 DIAGNOSIS — T50905A Adverse effect of unspecified drugs, medicaments and biological substances, initial encounter: Secondary | ICD-10-CM | POA: Diagnosis present

## 2021-06-29 DIAGNOSIS — I2721 Secondary pulmonary arterial hypertension: Secondary | ICD-10-CM | POA: Diagnosis present

## 2021-06-29 DIAGNOSIS — Z86718 Personal history of other venous thrombosis and embolism: Secondary | ICD-10-CM | POA: Diagnosis not present

## 2021-06-29 DIAGNOSIS — I2510 Atherosclerotic heart disease of native coronary artery without angina pectoris: Secondary | ICD-10-CM | POA: Diagnosis present

## 2021-06-29 DIAGNOSIS — I714 Abdominal aortic aneurysm, without rupture, unspecified: Secondary | ICD-10-CM | POA: Diagnosis present

## 2021-06-29 DIAGNOSIS — J9620 Acute and chronic respiratory failure, unspecified whether with hypoxia or hypercapnia: Secondary | ICD-10-CM | POA: Diagnosis not present

## 2021-06-29 DIAGNOSIS — J440 Chronic obstructive pulmonary disease with acute lower respiratory infection: Secondary | ICD-10-CM | POA: Diagnosis present

## 2021-06-29 DIAGNOSIS — T827XXA Infection and inflammatory reaction due to other cardiac and vascular devices, implants and grafts, initial encounter: Secondary | ICD-10-CM | POA: Diagnosis not present

## 2021-06-29 DIAGNOSIS — G4733 Obstructive sleep apnea (adult) (pediatric): Secondary | ICD-10-CM | POA: Diagnosis present

## 2021-06-29 DIAGNOSIS — E119 Type 2 diabetes mellitus without complications: Secondary | ICD-10-CM

## 2021-06-29 DIAGNOSIS — B954 Other streptococcus as the cause of diseases classified elsewhere: Secondary | ICD-10-CM | POA: Diagnosis present

## 2021-06-29 DIAGNOSIS — B966 Bacteroides fragilis [B. fragilis] as the cause of diseases classified elsewhere: Secondary | ICD-10-CM | POA: Diagnosis not present

## 2021-06-29 DIAGNOSIS — Z825 Family history of asthma and other chronic lower respiratory diseases: Secondary | ICD-10-CM

## 2021-06-29 DIAGNOSIS — E871 Hypo-osmolality and hyponatremia: Secondary | ICD-10-CM | POA: Diagnosis not present

## 2021-06-29 DIAGNOSIS — Z8673 Personal history of transient ischemic attack (TIA), and cerebral infarction without residual deficits: Secondary | ICD-10-CM

## 2021-06-29 DIAGNOSIS — Z9049 Acquired absence of other specified parts of digestive tract: Secondary | ICD-10-CM

## 2021-06-29 DIAGNOSIS — N179 Acute kidney failure, unspecified: Secondary | ICD-10-CM | POA: Diagnosis present

## 2021-06-29 DIAGNOSIS — I4819 Other persistent atrial fibrillation: Secondary | ICD-10-CM

## 2021-06-29 DIAGNOSIS — N390 Urinary tract infection, site not specified: Secondary | ICD-10-CM | POA: Diagnosis present

## 2021-06-29 DIAGNOSIS — R109 Unspecified abdominal pain: Secondary | ICD-10-CM | POA: Diagnosis present

## 2021-06-29 DIAGNOSIS — L8990 Pressure ulcer of unspecified site, unspecified stage: Secondary | ICD-10-CM | POA: Diagnosis present

## 2021-06-29 DIAGNOSIS — I959 Hypotension, unspecified: Secondary | ICD-10-CM | POA: Diagnosis not present

## 2021-06-29 DIAGNOSIS — E669 Obesity, unspecified: Secondary | ICD-10-CM | POA: Diagnosis present

## 2021-06-29 DIAGNOSIS — I10 Essential (primary) hypertension: Secondary | ICD-10-CM | POA: Diagnosis present

## 2021-06-29 DIAGNOSIS — J9621 Acute and chronic respiratory failure with hypoxia: Secondary | ICD-10-CM | POA: Diagnosis not present

## 2021-06-29 DIAGNOSIS — I5031 Acute diastolic (congestive) heart failure: Secondary | ICD-10-CM | POA: Diagnosis not present

## 2021-06-29 DIAGNOSIS — I5032 Chronic diastolic (congestive) heart failure: Secondary | ICD-10-CM | POA: Diagnosis not present

## 2021-06-29 DIAGNOSIS — Z20822 Contact with and (suspected) exposure to covid-19: Secondary | ICD-10-CM | POA: Diagnosis present

## 2021-06-29 DIAGNOSIS — I330 Acute and subacute infective endocarditis: Secondary | ICD-10-CM | POA: Diagnosis present

## 2021-06-29 DIAGNOSIS — I82409 Acute embolism and thrombosis of unspecified deep veins of unspecified lower extremity: Secondary | ICD-10-CM | POA: Diagnosis not present

## 2021-06-29 DIAGNOSIS — Z880 Allergy status to penicillin: Secondary | ICD-10-CM | POA: Diagnosis not present

## 2021-06-29 DIAGNOSIS — I4891 Unspecified atrial fibrillation: Secondary | ICD-10-CM | POA: Diagnosis present

## 2021-06-29 DIAGNOSIS — I129 Hypertensive chronic kidney disease with stage 1 through stage 4 chronic kidney disease, or unspecified chronic kidney disease: Secondary | ICD-10-CM

## 2021-06-29 DIAGNOSIS — J8410 Pulmonary fibrosis, unspecified: Secondary | ICD-10-CM | POA: Diagnosis present

## 2021-06-29 DIAGNOSIS — J9611 Chronic respiratory failure with hypoxia: Secondary | ICD-10-CM | POA: Diagnosis present

## 2021-06-29 DIAGNOSIS — I951 Orthostatic hypotension: Secondary | ICD-10-CM | POA: Diagnosis not present

## 2021-06-29 DIAGNOSIS — J189 Pneumonia, unspecified organism: Secondary | ICD-10-CM | POA: Diagnosis not present

## 2021-06-29 DIAGNOSIS — Y712 Prosthetic and other implants, materials and accessory cardiovascular devices associated with adverse incidents: Secondary | ICD-10-CM | POA: Diagnosis present

## 2021-06-29 DIAGNOSIS — N1831 Chronic kidney disease, stage 3a: Secondary | ICD-10-CM

## 2021-06-29 DIAGNOSIS — E039 Hypothyroidism, unspecified: Secondary | ICD-10-CM | POA: Diagnosis present

## 2021-06-29 DIAGNOSIS — Z86711 Personal history of pulmonary embolism: Secondary | ICD-10-CM

## 2021-06-29 DIAGNOSIS — R1010 Upper abdominal pain, unspecified: Secondary | ICD-10-CM | POA: Diagnosis not present

## 2021-06-29 DIAGNOSIS — I130 Hypertensive heart and chronic kidney disease with heart failure and stage 1 through stage 4 chronic kidney disease, or unspecified chronic kidney disease: Secondary | ICD-10-CM | POA: Diagnosis not present

## 2021-06-29 DIAGNOSIS — B961 Klebsiella pneumoniae [K. pneumoniae] as the cause of diseases classified elsewhere: Secondary | ICD-10-CM | POA: Diagnosis present

## 2021-06-29 DIAGNOSIS — I480 Paroxysmal atrial fibrillation: Secondary | ICD-10-CM | POA: Diagnosis not present

## 2021-06-29 DIAGNOSIS — R0902 Hypoxemia: Secondary | ICD-10-CM | POA: Diagnosis not present

## 2021-06-29 DIAGNOSIS — T827XXD Infection and inflammatory reaction due to other cardiac and vascular devices, implants and grafts, subsequent encounter: Secondary | ICD-10-CM

## 2021-06-29 DIAGNOSIS — Z87891 Personal history of nicotine dependence: Secondary | ICD-10-CM

## 2021-06-29 DIAGNOSIS — Z7401 Bed confinement status: Secondary | ICD-10-CM | POA: Diagnosis not present

## 2021-06-29 DIAGNOSIS — F32A Depression, unspecified: Secondary | ICD-10-CM | POA: Diagnosis present

## 2021-06-29 DIAGNOSIS — D631 Anemia in chronic kidney disease: Secondary | ICD-10-CM | POA: Diagnosis present

## 2021-06-29 DIAGNOSIS — R29898 Other symptoms and signs involving the musculoskeletal system: Secondary | ICD-10-CM | POA: Diagnosis not present

## 2021-06-29 DIAGNOSIS — R6521 Severe sepsis with septic shock: Secondary | ICD-10-CM

## 2021-06-29 DIAGNOSIS — I509 Heart failure, unspecified: Secondary | ICD-10-CM | POA: Diagnosis not present

## 2021-06-29 DIAGNOSIS — Z7901 Long term (current) use of anticoagulants: Secondary | ICD-10-CM

## 2021-06-29 DIAGNOSIS — I495 Sick sinus syndrome: Secondary | ICD-10-CM | POA: Diagnosis not present

## 2021-06-29 DIAGNOSIS — Z751 Person awaiting admission to adequate facility elsewhere: Secondary | ICD-10-CM

## 2021-06-29 DIAGNOSIS — K521 Toxic gastroenteritis and colitis: Secondary | ICD-10-CM | POA: Diagnosis present

## 2021-06-29 DIAGNOSIS — I2690 Septic pulmonary embolism without acute cor pulmonale: Secondary | ICD-10-CM | POA: Diagnosis present

## 2021-06-29 DIAGNOSIS — Z886 Allergy status to analgesic agent status: Secondary | ICD-10-CM

## 2021-06-29 DIAGNOSIS — A409 Streptococcal sepsis, unspecified: Secondary | ICD-10-CM | POA: Diagnosis not present

## 2021-06-29 DIAGNOSIS — B955 Unspecified streptococcus as the cause of diseases classified elsewhere: Secondary | ICD-10-CM | POA: Diagnosis present

## 2021-06-29 DIAGNOSIS — M549 Dorsalgia, unspecified: Secondary | ICD-10-CM | POA: Diagnosis present

## 2021-06-29 DIAGNOSIS — L89891 Pressure ulcer of other site, stage 1: Secondary | ICD-10-CM | POA: Diagnosis present

## 2021-06-29 DIAGNOSIS — R7989 Other specified abnormal findings of blood chemistry: Secondary | ICD-10-CM | POA: Diagnosis present

## 2021-06-29 DIAGNOSIS — A419 Sepsis, unspecified organism: Secondary | ICD-10-CM | POA: Diagnosis present

## 2021-06-29 DIAGNOSIS — Y828 Other medical devices associated with adverse incidents: Secondary | ICD-10-CM | POA: Diagnosis present

## 2021-06-29 DIAGNOSIS — I428 Other cardiomyopathies: Secondary | ICD-10-CM | POA: Diagnosis present

## 2021-06-29 DIAGNOSIS — J811 Chronic pulmonary edema: Secondary | ICD-10-CM | POA: Diagnosis not present

## 2021-06-29 DIAGNOSIS — D649 Anemia, unspecified: Secondary | ICD-10-CM | POA: Diagnosis not present

## 2021-06-29 DIAGNOSIS — I110 Hypertensive heart disease with heart failure: Secondary | ICD-10-CM | POA: Diagnosis not present

## 2021-06-29 DIAGNOSIS — E1122 Type 2 diabetes mellitus with diabetic chronic kidney disease: Secondary | ICD-10-CM | POA: Diagnosis present

## 2021-06-29 DIAGNOSIS — Z90710 Acquired absence of both cervix and uterus: Secondary | ICD-10-CM

## 2021-06-29 DIAGNOSIS — E785 Hyperlipidemia, unspecified: Secondary | ICD-10-CM | POA: Diagnosis not present

## 2021-06-29 DIAGNOSIS — D62 Acute posthemorrhagic anemia: Secondary | ICD-10-CM | POA: Diagnosis not present

## 2021-06-29 DIAGNOSIS — J9610 Chronic respiratory failure, unspecified whether with hypoxia or hypercapnia: Secondary | ICD-10-CM | POA: Diagnosis present

## 2021-06-29 DIAGNOSIS — I272 Pulmonary hypertension, unspecified: Secondary | ICD-10-CM | POA: Diagnosis present

## 2021-06-29 DIAGNOSIS — Z95828 Presence of other vascular implants and grafts: Secondary | ICD-10-CM

## 2021-06-29 DIAGNOSIS — J9601 Acute respiratory failure with hypoxia: Secondary | ICD-10-CM | POA: Diagnosis not present

## 2021-06-29 DIAGNOSIS — Z8249 Family history of ischemic heart disease and other diseases of the circulatory system: Secondary | ICD-10-CM

## 2021-06-29 DIAGNOSIS — G8929 Other chronic pain: Secondary | ICD-10-CM | POA: Diagnosis present

## 2021-06-29 DIAGNOSIS — R7881 Bacteremia: Secondary | ICD-10-CM | POA: Diagnosis present

## 2021-06-29 DIAGNOSIS — Z8349 Family history of other endocrine, nutritional and metabolic diseases: Secondary | ICD-10-CM

## 2021-06-29 DIAGNOSIS — N1832 Chronic kidney disease, stage 3b: Secondary | ICD-10-CM | POA: Diagnosis not present

## 2021-06-29 DIAGNOSIS — Z950 Presence of cardiac pacemaker: Secondary | ICD-10-CM

## 2021-06-29 DIAGNOSIS — Z79899 Other long term (current) drug therapy: Secondary | ICD-10-CM

## 2021-06-29 DIAGNOSIS — Z803 Family history of malignant neoplasm of breast: Secondary | ICD-10-CM

## 2021-06-29 DIAGNOSIS — Z9981 Dependence on supplemental oxygen: Secondary | ICD-10-CM

## 2021-06-29 DIAGNOSIS — I340 Nonrheumatic mitral (valve) insufficiency: Secondary | ICD-10-CM | POA: Diagnosis not present

## 2021-06-29 DIAGNOSIS — E876 Hypokalemia: Secondary | ICD-10-CM | POA: Diagnosis not present

## 2021-06-29 DIAGNOSIS — M1990 Unspecified osteoarthritis, unspecified site: Secondary | ICD-10-CM | POA: Diagnosis present

## 2021-06-29 DIAGNOSIS — Z6839 Body mass index (BMI) 39.0-39.9, adult: Secondary | ICD-10-CM

## 2021-06-29 DIAGNOSIS — I890 Lymphedema, not elsewhere classified: Secondary | ICD-10-CM | POA: Diagnosis present

## 2021-06-29 LAB — CBC WITH DIFFERENTIAL/PLATELET
Abs Immature Granulocytes: 0.9 10*3/uL — ABNORMAL HIGH (ref 0.00–0.07)
Basophils Absolute: 0 10*3/uL (ref 0.0–0.1)
Basophils Relative: 0 %
Eosinophils Absolute: 0.2 10*3/uL (ref 0.0–0.5)
Eosinophils Relative: 1 %
HCT: 24.4 % — ABNORMAL LOW (ref 36.0–46.0)
Hemoglobin: 8 g/dL — ABNORMAL LOW (ref 12.0–15.0)
Immature Granulocytes: 6 %
Lymphocytes Relative: 11 %
Lymphs Abs: 1.7 10*3/uL (ref 0.7–4.0)
MCH: 27.4 pg (ref 26.0–34.0)
MCHC: 32.8 g/dL (ref 30.0–36.0)
MCV: 83.6 fL (ref 80.0–100.0)
Monocytes Absolute: 1.1 10*3/uL — ABNORMAL HIGH (ref 0.1–1.0)
Monocytes Relative: 7 %
Neutro Abs: 11.9 10*3/uL — ABNORMAL HIGH (ref 1.7–7.7)
Neutrophils Relative %: 75 %
Platelets: 135 10*3/uL — ABNORMAL LOW (ref 150–400)
RBC: 2.92 MIL/uL — ABNORMAL LOW (ref 3.87–5.11)
RDW: 14.3 % (ref 11.5–15.5)
WBC: 15.8 10*3/uL — ABNORMAL HIGH (ref 4.0–10.5)
nRBC: 0.4 % — ABNORMAL HIGH (ref 0.0–0.2)

## 2021-06-29 LAB — HEPATIC FUNCTION PANEL
ALT: 12 U/L (ref 0–44)
AST: 13 U/L — ABNORMAL LOW (ref 15–41)
Albumin: 2.3 g/dL — ABNORMAL LOW (ref 3.5–5.0)
Alkaline Phosphatase: 122 U/L (ref 38–126)
Bilirubin, Direct: 0.3 mg/dL — ABNORMAL HIGH (ref 0.0–0.2)
Indirect Bilirubin: 0.6 mg/dL (ref 0.3–0.9)
Total Bilirubin: 0.9 mg/dL (ref 0.3–1.2)
Total Protein: 6.8 g/dL (ref 6.5–8.1)

## 2021-06-29 LAB — TSH: TSH: 0.054 u[IU]/mL — ABNORMAL LOW (ref 0.350–4.500)

## 2021-06-29 LAB — COMPREHENSIVE METABOLIC PANEL
ALT: 11 U/L (ref 0–44)
AST: 14 U/L — ABNORMAL LOW (ref 15–41)
Albumin: 2.3 g/dL — ABNORMAL LOW (ref 3.5–5.0)
Alkaline Phosphatase: 126 U/L (ref 38–126)
Anion gap: 10 (ref 5–15)
BUN: 31 mg/dL — ABNORMAL HIGH (ref 8–23)
CO2: 34 mmol/L — ABNORMAL HIGH (ref 22–32)
Calcium: 8.3 mg/dL — ABNORMAL LOW (ref 8.9–10.3)
Chloride: 96 mmol/L — ABNORMAL LOW (ref 98–111)
Creatinine, Ser: 0.96 mg/dL (ref 0.44–1.00)
GFR, Estimated: >60 mL/min (ref >60)
Glucose, Bld: 226 mg/dL — ABNORMAL HIGH (ref 70–99)
Potassium: 3.9 mmol/L (ref 3.5–5.1)
Sodium: 140 mmol/L (ref 135–145)
Total Bilirubin: 1.1 mg/dL (ref 0.3–1.2)
Total Protein: 6.4 g/dL — ABNORMAL LOW (ref 6.5–8.1)

## 2021-06-29 LAB — CBC
HCT: 27.1 % — ABNORMAL LOW (ref 36.0–46.0)
Hemoglobin: 8.9 g/dL — ABNORMAL LOW (ref 12.0–15.0)
MCH: 27.6 pg (ref 26.0–34.0)
MCHC: 32.8 g/dL (ref 30.0–36.0)
MCV: 84.2 fL (ref 80.0–100.0)
Platelets: 155 10*3/uL (ref 150–400)
RBC: 3.22 MIL/uL — ABNORMAL LOW (ref 3.87–5.11)
RDW: 14.1 % (ref 11.5–15.5)
WBC: 18.3 10*3/uL — ABNORMAL HIGH (ref 4.0–10.5)
nRBC: 0.3 % — ABNORMAL HIGH (ref 0.0–0.2)

## 2021-06-29 LAB — GLUCOSE, CAPILLARY
Glucose-Capillary: 179 mg/dL — ABNORMAL HIGH (ref 70–99)
Glucose-Capillary: 223 mg/dL — ABNORMAL HIGH (ref 70–99)
Glucose-Capillary: 261 mg/dL — ABNORMAL HIGH (ref 70–99)
Glucose-Capillary: 204 mg/dL — ABNORMAL HIGH (ref 70–99)

## 2021-06-29 LAB — PROCALCITONIN: Procalcitonin: 0.59 ng/mL

## 2021-06-29 LAB — DIGOXIN LEVEL: Digoxin Level: <0.2 ng/mL — ABNORMAL LOW (ref 0.8–2.0)

## 2021-06-29 MED ORDER — TORSEMIDE 20 MG PO TABS: 40.0000 mg | ORAL_TABLET | Freq: Two times a day (BID) | ORAL | Status: DC

## 2021-06-29 MED ORDER — ACETAMINOPHEN 650 MG RE SUPP: 650.0000 mg | Freq: Four times a day (QID) | RECTAL | Status: DC | PRN

## 2021-06-29 MED ORDER — HEPARIN (PORCINE) 25000 UT/250ML-% IV SOLN
1500.0000 [IU]/h | INTRAVENOUS | Status: DC
  Administered 2021-06-29: 600 [IU]/h via INTRAVENOUS
  Administered 2021-06-30: 1300 [IU]/h via INTRAVENOUS
  Administered 2021-07-01: 1500 [IU]/h via INTRAVENOUS
  Filled 2021-06-29 (×3): qty 250

## 2021-06-29 MED ORDER — ACETAMINOPHEN 325 MG PO TABS
650.0000 mg | ORAL_TABLET | Freq: Four times a day (QID) | ORAL | Status: DC | PRN
  Administered 2021-06-30: 650 mg via ORAL
  Administered 2021-07-05: 325 mg via ORAL
  Administered 2021-07-05 – 2021-07-12 (×5): 650 mg via ORAL
  Filled 2021-06-29 (×6): qty 2

## 2021-06-29 MED ORDER — INSULIN ASPART 100 UNIT/ML IJ SOLN
0.0000 [IU] | Freq: Three times a day (TID) | INTRAMUSCULAR | Status: DC
  Administered 2021-06-29: 5 [IU] via SUBCUTANEOUS
  Administered 2021-06-30: 2 [IU] via SUBCUTANEOUS
  Administered 2021-06-30: 3 [IU] via SUBCUTANEOUS
  Administered 2021-06-30 – 2021-07-02 (×5): 2 [IU] via SUBCUTANEOUS
  Administered 2021-07-02: 3 [IU] via SUBCUTANEOUS
  Administered 2021-07-02 – 2021-07-03 (×4): 2 [IU] via SUBCUTANEOUS
  Administered 2021-07-04: 1 [IU] via SUBCUTANEOUS
  Administered 2021-07-05: 2 [IU] via SUBCUTANEOUS
  Administered 2021-07-05 (×2): 1 [IU] via SUBCUTANEOUS
  Administered 2021-07-06: 2 [IU] via SUBCUTANEOUS
  Administered 2021-07-06: 1 [IU] via SUBCUTANEOUS
  Administered 2021-07-06: 2 [IU] via SUBCUTANEOUS
  Administered 2021-07-07: 1 [IU] via SUBCUTANEOUS
  Administered 2021-07-07: 2 [IU] via SUBCUTANEOUS
  Administered 2021-07-08 – 2021-07-10 (×5): 1 [IU] via SUBCUTANEOUS

## 2021-06-29 MED ORDER — DULOXETINE HCL 60 MG PO CPEP
60.0000 mg | ORAL_CAPSULE | Freq: Every day | ORAL | Status: DC
  Administered 2021-06-30 – 2021-07-12 (×13): 60 mg via ORAL
  Filled 2021-06-29 (×13): qty 1

## 2021-06-29 MED ORDER — HYDROCODONE-ACETAMINOPHEN 5-325 MG PO TABS
1.0000 | ORAL_TABLET | ORAL | Status: DC | PRN
  Administered 2021-07-02 – 2021-07-12 (×4): 1 via ORAL
  Filled 2021-06-29 (×4): qty 1
  Filled 2021-06-29: qty 2

## 2021-06-29 MED ORDER — SENNOSIDES-DOCUSATE SODIUM 8.6-50 MG PO TABS: 1.0000 | ORAL_TABLET | Freq: Every evening | ORAL | Status: DC | PRN

## 2021-06-29 MED ORDER — SODIUM CHLORIDE 0.9 % IV SOLN: 250.0000 mL | INTRAVENOUS | Status: DC | PRN

## 2021-06-29 MED ORDER — MORPHINE SULFATE (PF) 2 MG/ML IV SOLN
2.0000 mg | INTRAVENOUS | Status: DC | PRN
  Administered 2021-06-29: 2 mg via INTRAVENOUS
  Filled 2021-06-29: qty 1

## 2021-06-29 MED ORDER — HYDRALAZINE HCL 20 MG/ML IJ SOLN: 10.0000 mg | INTRAMUSCULAR | Status: DC | PRN

## 2021-06-29 MED ORDER — GABAPENTIN 300 MG PO CAPS
600.0000 mg | ORAL_CAPSULE | Freq: Three times a day (TID) | ORAL | Status: DC
  Administered 2021-06-29 – 2021-07-12 (×41): 600 mg via ORAL
  Filled 2021-06-29 (×41): qty 2

## 2021-06-29 MED ORDER — MORPHINE SULFATE (PF) 2 MG/ML IV SOLN
1.0000 mg | INTRAVENOUS | Status: DC | PRN
  Administered 2021-06-29: 1 mg via INTRAVENOUS
  Filled 2021-06-29: qty 1

## 2021-06-29 MED ORDER — MACITENTAN 10 MG PO TABS
10.0000 mg | ORAL_TABLET | Freq: Every day | ORAL | Status: DC
  Administered 2021-06-29 – 2021-07-12 (×14): 10 mg via ORAL
  Filled 2021-06-29 (×15): qty 1

## 2021-06-29 MED ORDER — DIGOXIN 125 MCG PO TABS
0.1250 mg | ORAL_TABLET | Freq: Every day | ORAL | Status: DC
  Administered 2021-06-29 – 2021-07-09 (×11): 0.125 mg via ORAL
  Filled 2021-06-29 (×11): qty 1

## 2021-06-29 MED ORDER — SODIUM CHLORIDE 0.9% FLUSH
3.0000 mL | INTRAVENOUS | Status: DC | PRN
  Administered 2021-07-06: 3 mL via INTRAVENOUS

## 2021-06-29 MED ORDER — DIFLUPREDNATE 0.05 % OP EMUL
1.0000 [drp] | Freq: Two times a day (BID) | OPHTHALMIC | Status: DC
  Administered 2021-06-29 – 2021-07-12 (×25): 1 [drp] via OPHTHALMIC
  Filled 2021-06-29: qty 5

## 2021-06-29 MED ORDER — SODIUM CHLORIDE 0.9 % IV SOLN: INTRAVENOUS | Status: AC

## 2021-06-29 MED ORDER — SELEXIPAG 1600 MCG PO TABS
800.0000 ug | ORAL_TABLET | Freq: Two times a day (BID) | ORAL | Status: DC
  Administered 2021-06-29 – 2021-06-30 (×3): 800 ug via ORAL
  Filled 2021-06-29 (×4): qty 1

## 2021-06-29 MED ORDER — SILDENAFIL CITRATE 20 MG PO TABS
20.0000 mg | ORAL_TABLET | Freq: Three times a day (TID) | ORAL | Status: DC
  Administered 2021-06-29 – 2021-07-12 (×40): 20 mg via ORAL
  Filled 2021-06-29 (×40): qty 1

## 2021-06-29 MED ORDER — DILTIAZEM HCL 60 MG PO TABS
30.0000 mg | ORAL_TABLET | Freq: Four times a day (QID) | ORAL | Status: DC
  Administered 2021-06-29 – 2021-07-03 (×15): 30 mg via ORAL
  Filled 2021-06-29 (×15): qty 1

## 2021-06-29 MED ORDER — LABETALOL HCL 5 MG/ML IV SOLN
5.0000 mg | Freq: Four times a day (QID) | INTRAVENOUS | Status: DC | PRN
  Administered 2021-06-29: 5 mg via INTRAVENOUS
  Filled 2021-06-29: qty 4

## 2021-06-29 MED ORDER — SODIUM CHLORIDE 0.9% FLUSH
3.0000 mL | Freq: Two times a day (BID) | INTRAVENOUS | Status: DC
  Administered 2021-06-29 – 2021-07-12 (×17): 3 mL via INTRAVENOUS

## 2021-06-29 MED ORDER — ALBUTEROL SULFATE (2.5 MG/3ML) 0.083% IN NEBU: 3.0000 mL | INHALATION_SOLUTION | Freq: Four times a day (QID) | RESPIRATORY_TRACT | Status: DC | PRN

## 2021-06-29 MED ORDER — SODIUM CHLORIDE 0.9 % IV SOLN
2.0000 g | INTRAVENOUS | Status: DC
  Administered 2021-06-29 – 2021-07-03 (×5): 2 g via INTRAVENOUS
  Filled 2021-06-29 (×6): qty 20

## 2021-06-29 MED ORDER — PANTOPRAZOLE SODIUM 40 MG IV SOLR
40.0000 mg | INTRAVENOUS | Status: DC
  Administered 2021-06-30 – 2021-07-12 (×13): 40 mg via INTRAVENOUS
  Filled 2021-06-29 (×13): qty 40

## 2021-06-29 MED ORDER — ROSUVASTATIN CALCIUM 5 MG PO TABS
10.0000 mg | ORAL_TABLET | Freq: Every day | ORAL | Status: DC
  Administered 2021-06-30 – 2021-07-12 (×13): 10 mg via ORAL
  Filled 2021-06-29 (×12): qty 2

## 2021-06-29 MED ORDER — SPIRONOLACTONE 25 MG PO TABS
25.0000 mg | ORAL_TABLET | Freq: Every day | ORAL | Status: DC
  Administered 2021-06-29 – 2021-07-04 (×5): 25 mg via ORAL
  Filled 2021-06-29 (×6): qty 1

## 2021-06-29 NOTE — Assessment & Plan Note (Addendum)
-   Blood cultures 1/03 + Streptococcus group G -TEE 1/6 large mobile vegetation on the RV pacing wire noted in the right atrium with estimated 1x 1.5 cm in size or larger -Blood cultures 1/5 no growth -Septic shock physiology resolved, received IV Levophed and fluids. Cardiology, ID following. -Per EP cardiology, not a candidate for transvenous lead extraction because of significant comorbid conditions. 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   -Patient passed amoxicillin challenge. Per ID start penicillin G to complete 6 weeks of antibiotics, last day of treatment on 08/08/2021 followed by suppressive therapy with amoxicillin.  Outpatient follow-up with ID.

## 2021-06-29 NOTE — Assessment & Plan Note (Addendum)
-   Continue Crestor, LFTs normal

## 2021-06-29 NOTE — Assessment & Plan Note (Addendum)
-  Hemoglobin A1c of 5.4 on 06/25/2021 -Well-controlled, CBGs likely elevated in the setting of severe sepsis and steroids, continue sliding scale insulin -Place on SGLT2 inhibitor prior to discharge per cardiology

## 2021-06-29 NOTE — Assessment & Plan Note (Addendum)
-  Creatinine peaked at 3.21, -Creatinine slightly trending up, decreased to torsemide.  Monitor renal function

## 2021-06-29 NOTE — Assessment & Plan Note (Addendum)
Baseline hgb: 9-10.  History of anemia of chronic disease  --GI was consulted, deferred endoscopy  -Received 2 units packed RBCs on 1/13 for hemoglobin 6.8   - FOBT 1/12 negative. -H&H stable

## 2021-06-29 NOTE — Progress Notes (Signed)
Report called to Palma Holter, RN on 2C at [facility].

## 2021-06-29 NOTE — Assessment & Plan Note (Addendum)
-   BP currently stable

## 2021-06-29 NOTE — Assessment & Plan Note (Addendum)
Continue CPAP nightly. °

## 2021-06-29 NOTE — Assessment & Plan Note (Deleted)
Appears euvolemic on exam today Echo 06/27/21: EF of 60-65% normal LVF. Diastolic parameters indeterminate Start back her spironolactone. Per cards: hold Demadex until PO intake picks up I/O

## 2021-06-29 NOTE — Progress Notes (Signed)
Patient seen and examined. Remains stable for transfer . She has persistent moderate  RUQ abdominal pain likely secondary to pleurisy/septic lung emboli. Low dose morphine before transfer . Dc summary reviewed and remains unchanged.    No charge visit.

## 2021-06-29 NOTE — Assessment & Plan Note (Addendum)
-  Patient was started on IV amiodarone on 1/8 due to atrial fibrillation with RVR.  Failed rate control with oral Cardizem. -Was on amiodarone but was stopped in 2019 due to lung disease.  Continue digoxin -Underwent successful DCCV.  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

## 2021-06-29 NOTE — Consult Note (Signed)
{ Cardiology Consultation:   Patient ID: WANNA GULLY MRN: [id number]; DOB: 07-Sep-1945  Admit date: 06/29/2021 Date of Consult: 06/29/2021  PCP:  [name], MD   [facility] Providers Cardiologist:  [name], MD  Electrophysiologist:  Virl Axe, MD  {    Patient Profile:   Debra Saunders is a 76 y.o. female with a hx of paroxysmal atrial fibrillation, chronic diastolic congestive heart failure, pulmonary hypertension, history of intracranial hemorrhage, hypertension, hyperlipidemia, interstitial lung disease on home oxygen, previous pacemaker, diabetes mellitus who is being seen 06/29/2021 for the evaluation of pacemaker infection at the request of Orma Flaming, MD.  History of Present Illness:   Patient is followed by Dr. [name] for chronic diastolic congestive heart failure, paroxysmal atrial fibrillation and pulmonary hypertension.  She is followed by Dr. Caryl Comes for her pacemaker.  Patient was admitted to [facility] January 3 with complaints of dyspnea, cough, fevers, chills.  She was found to pneumonia and was in septic shock treated with antibiotics and pressors.  Blood cultures positive for Streptococcus group G.  Medications used for pulmonary hypertension were placed on hold due to hypotension.  Patient had transesophageal echocardiogram on January 6 which showed mobile density on the pacer wire in the right atrium above the tricuspid valve (1 x 1.5 cm).  No vegetations noted on the valves.  Ejection fraction was 55% and there was note of mild to moderate mitral regurgitation.  She was transferred to [facility] as she will need pacemaker extraction.  At present she denies dyspnea, chest pain and is overall feeling better.   Past Medical History:  Diagnosis Date   Arthritis    Atrial fibrillation, persistent ([place])    a. s/p TEE-guided DCCV on 08/20/2017   Cardiorenal syndrome    a. 09/2017 Creat rose to 2.7 w/ diuresis-->HD x 2.   Chest pain     a. 01/2012 Cath: nonobs dzs;  c. 04/2014 MV: No ischemia.   Chronic back pain    Chronic combined systolic (congestive) and diastolic (congestive) heart failure ([place])    a. Dx 2005 @ [facility];  b. 02/2014 Echo: EF 55-60%; c. 07/2017: EF 30-35%, diffuse HK, trivial AI, severe MR, moderate TR; d. 09/2017 Echo: EF 40-45%; e. 02/2018 Echo: EF 55-60%, Gr1 DD; f. 08/2019 Echo: EF 60-65%, Gr2 DD, Nl RV fxn, RVSP 49.84mmHg. Mod-Sev MR. Mild to mod TR. Mod PR. RA ~ 72mmHg.   Chronic respiratory failure (HCC)    a. on home O2   Gastritis    History of intracranial hemorrhage    a. 6/14 described by neurosurgery as "small frontal posttraumatic SAH " - pt was on xarelto @ the time.   Hyperlipidemia    Hypertension    Interstitial lung disease (HCC)    Lipoma of back    Lymphedema    a. Chronic LLE edema.   NICM (nonischemic cardiomyopathy) ([place])    a. 09/2017 Echo: EF 40-45%; b. 09/2017 Cath: minor, insignificant CAD; c. 02/2018 Echo: EF 55-60%, Gr1 DD; d. 08/2019 Echo: EF 60-65%, Gr2 DD.   Obesity    PAH (pulmonary artery hypertension) ([place])    a. 09/2017 Echo: PASP 38mmHg; b. 09/2017 RHC: RA 23, RV 84/13, PA 84/29, PCWP 20, CO 3.89, CI 1.89. LVEDP 18.   Presence of permanent cardiac pacemaker 01/09/2014   Sepsis with metabolic encephalopathy ([place])    Sleep apnea    Streptococcal bacteremia    Stroke [facility]) 2014   Subarachnoid hemorrhage ([place]) 01/19/2013  Symptomatic bradycardia    a. s/p BSX PPM.   Thyroid nodule    Type II diabetes mellitus ([place])    Urinary incontinence     Past Surgical History:  Procedure Laterality Date   ABDOMINAL HYSTERECTOMY  1969   BREAST BIOPSY Right 10/28/2019   lymph node bx, [place] Bx, pending path    CARDIAC CATHETERIZATION  2013   @ [place]: No obstructive CAD: Only 20% ostial left circumflex.   CARDIOVERSION N/A 10/12/2017   Procedure: CARDIOVERSION;  Surgeon: Minna Merritts, MD;  Location: [facility] ORS;  Service: Cardiovascular;  Laterality: N/A;   CATARACT EXTRACTION W/PHACO  Right 09/19/2020   Procedure: CATARACT EXTRACTION PHACO AND INTRAOCULAR LENS PLACEMENT (IOC) RIGHT DIABETIC 3.95 00:43.0 9.2%;  Surgeon: Leandrew Koyanagi, MD;  Location: [place];  Service: Ophthalmology;  Laterality: Right;  Pt requests to be last sleep apnea   CATARACT EXTRACTION W/PHACO Left 10/03/2020   Procedure: CATARACT EXTRACTION PHACO AND INTRAOCULAR LENS PLACEMENT ([place]) LEFT DIABETIC;  Surgeon: Leandrew Koyanagi, MD;  Location: [place];  Service: Ophthalmology;  Laterality: Left;  3.25 0:59.3 5.5%   CHOLECYSTECTOMY     COLONOSCOPY WITH PROPOFOL N/A 09/10/2018   Procedure: COLONOSCOPY WITH PROPOFOL;  Surgeon: Manya Silvas, MD;  Location: [facility]COPY;  Service: Endoscopy;  Laterality: N/A;   CYST REMOVAL TRUNK  2002   BACK   DIALYSIS/PERMA CATHETER INSERTION N/A 10/06/2017   Procedure: DIALYSIS/PERMA CATHETER INSERTION;  Surgeon: Katha Cabal, MD;  Location: [place] CV LAB;  Service: Cardiovascular;  Laterality: N/A;   INSERT / REPLACE / REMOVE PACEMAKER     lipoma removal  2014   back   PACEMAKER INSERTION  2015   [employer] dual chamber pacemaker implanted by Dr Caryl Comes for symptomatic bradycardia   PERMANENT PACEMAKER INSERTION N/A 01/09/2014   Procedure: PERMANENT PACEMAKER INSERTION;  Surgeon: Deboraha Sprang, MD;  Location: [facility] CATH LAB;  Service: Cardiovascular;  Laterality: N/A;   RIGHT HEART CATH N/A 04/19/2018   Procedure: RIGHT HEART CATH;  Surgeon: Larey Dresser, MD;  Location: [place] CV LAB;  Service: Cardiovascular;  Laterality: N/A;   RIGHT HEART CATH N/A 08/30/2020   Procedure: RIGHT HEART CATH;  Surgeon: Larey Dresser, MD;  Location: [place] CV LAB;  Service: Cardiovascular;  Laterality: N/A;   RIGHT/LEFT HEART CATH AND CORONARY ANGIOGRAPHY N/A 10/19/2017   Procedure: RIGHT/LEFT HEART CATH AND CORONARY ANGIOGRAPHY;  Surgeon: Wellington Hampshire, MD;  Location: [place] CV LAB;  Service: Cardiovascular;   Laterality: N/A;   TEE WITHOUT CARDIOVERSION N/A 08/20/2017   Procedure: TRANSESOPHAGEAL ECHOCARDIOGRAM (TEE) & Direct current cardioversion;  Surgeon: Minna Merritts, MD;  Location: [facility] ORS;  Service: Cardiovascular;  Laterality: N/A;   TEE WITHOUT CARDIOVERSION N/A 06/28/2021   Procedure: TRANSESOPHAGEAL ECHOCARDIOGRAM (TEE);  Surgeon: Minna Merritts, MD;  Location: [facility] ORS;  Service: Cardiovascular;  Laterality: N/A;   TRIGGER FINGER RELEASE       Inpatient Medications: Scheduled Meds:  Difluprednate  1 drop Both Eyes BID   [START ON 06/30/2021] DULoxetine  60 mg Oral Daily   gabapentin  600 mg Oral TID   insulin aspart  0-9 Units Subcutaneous TID WC   [START ON 06/30/2021] rosuvastatin  10 mg Oral Daily   sodium chloride flush  3 mL Intravenous Q12H   Continuous Infusions:  sodium chloride     sodium chloride     cefTRIAXone (ROCEPHIN)  IV 2 g (06/29/21 1355)   PRN Meds: sodium chloride,  acetaminophen **OR** acetaminophen, albuterol, HYDROcodone-acetaminophen, labetalol, morphine injection, senna-docusate, sodium chloride flush  Allergies:    Allergies  Allergen Reactions   Aspirin Hives and Other (See Comments)    Pt states that she is unable to take because she had bleeding in her brain   Other Other (See Comments)    Pt states that she is unable to take blood thinners because she had bleeding in her brain; Blood Thinners-per doctor at [facility] (Xarelto)   Penicillins Hives, Shortness Of Breath, Swelling and Other (See Comments)    Has patient had a PCN reaction causing immediate rash, facial/tongue/throat swelling, SOB or lightheadedness with hypotension: Yes Has patient had a PCN reaction causing severe rash involving mucus membranes or skin necrosis: No Has patient had a PCN reaction that required hospitalization No Has patient had a PCN reaction occurring within the last 10 years: No If all of the above answers are "NO", then may proceed with Cephalosporin use.     Social History:   Social History   Socioeconomic History   Marital status: Divorced    Spouse name: Not on file   Number of children: 4   Years of education: 12   Highest education level: High school graduate  Occupational History   Occupation: Retired- factory work    Comment: exposed to Pharmacist, community  Tobacco Use   Smoking status: Former    Packs/day: 0.30    Years: 2.00    Pack years: 0.60    Types: Cigarettes    Quit date: 06/23/1990    Years since quitting: 31.0   Smokeless tobacco: Never  Vaping Use   Vaping Use: Never used  Substance and Sexual Activity   Alcohol use: No   Drug use: No   Sexual activity: Not Currently  Other Topics Concern   Not on file  Social History Narrative   Lives in [place] with daughter. Has 4 children. No pets.      Work - Restaurant manager, fast food, retired      Diet - regular      02/14/2019: reports has Meals On Wheels delivered to her home;       Remote hx of smoking > 30 years; No alcohol-    Social Determinants of Health   Financial Resource Strain: Low Risk    Difficulty of Paying Living Expenses: Not very hard  Food Insecurity: No Food Insecurity   Worried About Charity fundraiser in the Last Year: Never true   Ran Out of Food in the Last Year: Never true  Transportation Needs: No Transportation Needs   Lack of Transportation (Medical): No   Lack of Transportation (Non-Medical): No  Physical Activity: Not on file  Stress: No Stress Concern Present   Feeling of Stress : Only a little  Social Connections: Socially Isolated   Frequency of Communication with Friends and Family: More than three times a week   Frequency of Social Gatherings with Friends and Family: Once a week   Attends Religious Services: Never   Marine scientist or Organizations: No   Attends Music therapist: Never   Marital Status: Divorced  Human resources officer Violence: Not At Risk   Fear of Current or Ex-Partner: No   Emotionally Abused: No    Physically Abused: No   Sexually Abused: No    Family History:    Family History  Problem Relation Age of Onset   Heart disease Mother    Hypertension Mother    COPD Mother  was a smoker   Thyroid disease Mother    Hypertension Father    Hypertension Sister    Thyroid disease Sister    Breast cancer Sister    Hypertension Sister    Thyroid disease Sister    Bone cancer Sister    Breast cancer Maternal Aunt    Kidney disease Neg Hx    Bladder Cancer Neg Hx      ROS:  Please see the history of present illness.  Recent sepsis associated with fevers, chills All other ROS reviewed and negative.     Physical Exam/Data:   Vitals:   06/29/21 1156 06/29/21 1300  BP: (!) 189/96 (!) 185/109  Pulse: 100 100  Resp: 18 18  Temp: 98.6 F (37 C)   TempSrc: Oral   SpO2: 91% 96%   No intake or output data in the 24 hours ending 06/29/21 1405 Last 3 Weights 06/29/2021 06/25/2021 06/06/2021  Weight (lbs) 204 lb 9.4 oz 192 lb 0.3 oz 192 lb  Weight (kg) 92.8 kg 87.1 kg 87.091 kg     There is no height or weight on file to calculate BMI.  General:  Well nourished, well developed, in no acute distress HEENT: normal Neck: no JVD Vascular: No carotid bruits; Distal pulses 2+ bilaterally Cardiac:  normal S1, S2; RRR Lungs:  clear to auscultation bilaterally, no wheezing, rhonchi or rales  Abd: soft, nontender, no hepatomegaly  Ext: trace hip edema Musculoskeletal:  No deformities, BUE and BLE strength normal and equal Skin: warm and dry  Neuro:  CNs 2-12 intact, no focal abnormalities noted Psych:  Normal affect   EKG:  The EKG was personally reviewed and demonstrates: Ventricular paced rhythm Telemetry:  Telemetry was personally reviewed and demonstrates: Atrial fibrillation  Laboratory Data:  High Sensitivity Troponin:   Recent Labs  Lab 06/25/21 0333 06/25/21 1350  TROPONINIHS 276* 201*     Chemistry Recent Labs  Lab 06/27/21 0828 06/27/21 1749 06/28/21 0500   NA 129* 131* 134*  K 4.5 4.5 3.8  CL 95* 97* 100  CO2 24 25 30   GLUCOSE 227* 257* 242*  BUN 90* 84* 76*  CREATININE 2.05* 1.68* 1.46*  CALCIUM 7.2* 7.3* 7.5*  GFRNONAA 25* 31* 37*  ANIONGAP 10 9 4*    Recent Labs  Lab 06/25/21 1350 06/26/21 0847 06/28/21 0500  PROT 6.2* 6.6 6.3*  ALBUMIN 2.2* 2.3* 2.4*  AST 21 16 13*  ALT 13 11 12   ALKPHOS 126 137* 123  BILITOT 3.0* 1.9* 1.0   Lipids  Recent Labs  Lab 06/26/21 0847  CHOL 89  TRIG 132  HDL <10*  LDLCALC NOT CALCULATED  CHOLHDL NOT CALCULATED    Hematology Recent Labs  Lab 06/28/21 0500 06/29/21 0507 06/29/21 1254  WBC 9.3 15.8* 18.3*  RBC 2.79* 2.92* 3.22*  HGB 7.7* 8.0* 8.9*  HCT 23.4* 24.4* 27.1*  MCV 83.9 83.6 84.2  MCH 27.6 27.4 27.6  MCHC 32.9 32.8 32.8  RDW 14.7 14.3 14.1  PLT 118* 135* 155    BNP Recent Labs  Lab 06/25/21 1350  BNP 344.7*      Radiology/Studies:  ECHOCARDIOGRAM COMPLETE  Result Date: 06/27/2021    ECHOCARDIOGRAM REPORT   Patient Name:   Debra Saunders Date of Exam: 06/26/2021 Medical Rec #:  [id number]        Height:       67.0 in Accession #:    [id number]       Weight:  192.0 lb Date of Birth:  [birth year]         BSA:          1.988 m Patient Age:    56 years         BP:           139/44 mmHg Patient Gender: F                HR:           87 bpm. Exam Location:  [facility] Procedure: 2D Echo, Cardiac Doppler and Color Doppler Indications:     R78.81 Bacteremia  History:         Patient has prior history of Echocardiogram examinations, most                  recent 09/12/2019. Cardiomyopathy, Stroke, Arrythmias:Atrial                  Fibrillation; Risk Factors:Hypertension, Dyslipidemia, Diabetes                  and Sleep Apnea.  Sonographer:     Cresenciano Lick RDCS Referring Phys:  [id number] Barb Merino Diagnosing Phys: Ida Rogue MD IMPRESSIONS  1. Unable to exclude valve endocarditis as detailed below.  2. Left ventricular ejection fraction, by estimation, is 60 to 65%.  The left ventricle has normal function. The left ventricle has no regional wall motion abnormalities. There is mild left ventricular hypertrophy. Left ventricular diastolic parameters are indeterminate.  3. Right ventricular systolic function is normal. The right ventricular size is normal. There is moderately elevated pulmonary artery systolic pressure. The estimated right ventricular systolic pressure is 97.0 mmHg.  4. The mitral valve is normal in structure. Mild to moderate mitral valve regurgitation. No evidence of mitral stenosis.  5. Tricuspid valve regurgitation is moderate. Thickening of the tricuspid leaflets, unable to exclude vegetation.  6. The aortic valve is normal in structure. There is moderate calcification of the aortic valve. There is moderate thickening of the aortic valve. Aortic valve regurgitation is not visualized. valve thickening appears worse compared to study in 2021, unable to exclude vegetation.  7. Aortic valve sclerosis/calcification is present, without any evidence of aortic stenosis.  8. The inferior vena cava is normal in size with greater than 50% respiratory variability, suggesting right atrial pressure of 3 mmHg. FINDINGS  Left Ventricle: Left ventricular ejection fraction, by estimation, is 60 to 65%. The left ventricle has normal function. The left ventricle has no regional wall motion abnormalities. The left ventricular internal cavity size was normal in size. There is  mild left ventricular hypertrophy. Left ventricular diastolic parameters are indeterminate. Right Ventricle: The right ventricular size is normal. No increase in right ventricular wall thickness. Right ventricular systolic function is normal. There is moderately elevated pulmonary artery systolic pressure. The tricuspid regurgitant velocity is 3.41 m/s, and with an assumed right atrial pressure of 5 mmHg, the estimated right ventricular systolic pressure is 26.3 mmHg. Left Atrium: Left atrial size was normal  in size. Right Atrium: Right atrial size was normal in size. Pericardium: There is no evidence of pericardial effusion. Mitral Valve: The mitral valve is normal in structure. Mild to moderate mitral valve regurgitation. No evidence of mitral valve stenosis. Tricuspid Valve: The tricuspid valve is normal in structure. Tricuspid valve regurgitation is moderate . No evidence of tricuspid stenosis. Aortic Valve: The aortic valve is normal in structure. There is moderate calcification of the aortic valve. There is moderate  thickening of the aortic valve. Aortic valve regurgitation is not visualized. Aortic valve sclerosis/calcification is present, without any evidence of aortic stenosis. Aortic valve mean gradient measures 10.5 mmHg. Aortic valve peak gradient measures 18.4 mmHg. Aortic valve area, by VTI measures 1.73 cm. Pulmonic Valve: The pulmonic valve was normal in structure. Pulmonic valve regurgitation is mild. No evidence of pulmonic stenosis. Aorta: The aortic root is normal in size and structure. Venous: The inferior vena cava is normal in size with greater than 50% respiratory variability, suggesting right atrial pressure of 3 mmHg. IAS/Shunts: No atrial level shunt detected by color flow Doppler. Additional Comments: A device lead is visualized.  LEFT VENTRICLE PLAX 2D LVIDd:         5.00 cm   Diastology LVIDs:         2.80 cm   LV e' medial:    9.25 cm/s LV PW:         1.20 cm   LV E/e' medial:  14.9 LV IVS:        1.20 cm   LV e' lateral:   7.83 cm/s LVOT diam:     1.90 cm   LV E/e' lateral: 17.6 LV SV:         62 LV SV Index:   31 LVOT Area:     2.84 cm  RIGHT VENTRICLE             IVC RV Basal diam:  3.80 cm     IVC diam: 1.80 cm RV S prime:     18.47 cm/s TAPSE (M-mode): 2.2 cm LEFT ATRIUM             Index        RIGHT ATRIUM           Index LA diam:        4.00 cm 2.01 cm/m   RA Area:     17.40 cm LA Vol (A2C):   75.5 ml 37.99 ml/m  RA Volume:   48.20 ml  24.25 ml/m LA Vol (A4C):   79.3 ml 39.90  ml/m LA Biplane Vol: 78.5 ml 39.50 ml/m  AORTIC VALVE AV Area (Vmax):    1.71 cm AV Area (Vmean):   1.72 cm AV Area (VTI):     1.73 cm AV Vmax:           214.50 cm/s AV Vmean:          152.250 cm/s AV VTI:            0.356 m AV Peak Grad:      18.4 mmHg AV Mean Grad:      10.5 mmHg LVOT Vmax:         129.00 cm/s LVOT Vmean:        92.350 cm/s LVOT VTI:          0.217 m LVOT/AV VTI ratio: 0.61  AORTA Ao Root diam: 2.80 cm Ao Asc diam:  3.50 cm MV E velocity: 137.67 cm/s  TRICUSPID VALVE                             TR Peak grad:   46.5 mmHg                             TR Vmax:        341.00 cm/s  SHUNTS                             Systemic VTI:  0.22 m                             Systemic Diam: 1.90 cm Ida Rogue MD Electronically signed by Ida Rogue MD Signature Date/Time: 06/27/2021/10:07:07 AM    Final    ECHO TEE  Result Date: 06/28/2021    TRANSESOPHOGEAL ECHO REPORT   Patient Name:   Debra Saunders Date of Exam: 06/28/2021 Medical Rec #:  [id number]        Height:       67.0 in Accession #:    [id number]       Weight:       192.0 lb Date of Birth:  13-Feb-1946         BSA:          1.988 m Patient Age:    1 years         BP:           119/44 mmHg Patient Gender: F                HR:           71 bpm. Exam Location:  [facility] Procedure: Transesophageal Echo, Cardiac Doppler, Color Doppler and Saline            Contrast Bubble Study Indications:     Bacteremia R78.81  History:         Patient has prior history of Echocardiogram examinations, most                  recent 06/26/2021. Pacemaker, Arrythmias:Atrial Fibrillation;                  Risk Factors:Hypertension.  Sonographer:     Sherrie Sport Referring Phys:  [id number] [name] Diagnosing Phys: Ida Rogue MD PROCEDURE: After discussion of the risks and benefits of a TEE, an informed consent was obtained from the patient. TEE procedure time was 30 minutes. The transesophogeal probe was passed without difficulty through the  esophogus of the patient. Imaged were obtained with the patient in a left lateral decubitus position. Local oropharyngeal anesthetic was provided with Benzocaine spray and Cetacaine. Sedation performed by performing physician. Patients was under conscious sedation during this procedure.  Anesthetic administered: 19mcg of Fentanyl, 3mg  of Versed. Image quality was excellent. The patient's vital signs; including heart rate, blood pressure, and oxygen saturation; remained stable throughout the procedure. The patient developed no complications during the procedure. IMPRESSIONS  1. Large mobile vegetation on the RV pacing wire noted in the right atrium, with estimated to be 1 x 1.5 cm in size or larger.  2. Left ventricular ejection fraction, by estimation, is 55 to 60%. The left ventricle has normal function. The left ventricle has no regional wall motion abnormalities. There is mild left ventricular hypertrophy.  3. Right ventricular systolic function is normal. The right ventricular size is normal.  4. No left atrial/left atrial appendage thrombus was detected.  5. The mitral valve is normal in structure. Mild to moderate mitral valve regurgitation. No evidence of mitral stenosis.  6. The aortic valve is normal in structure. There is moderate calcification of the aortic valve. Aortic valve regurgitation is not visualized. No aortic stenosis is present.  7. There is mild (  Grade II) atheroma plaque involving the aortic arch and descending aorta.  8. Agitated saline contrast bubble study was negative, with no evidence of any interatrial shunt. Conclusion(s)/Recommendation(s): Normal biventricular function without evidence of hemodynamically significant valvular heart disease. FINDINGS  Left Ventricle: Left ventricular ejection fraction, by estimation, is 55 to 60%. The left ventricle has normal function. The left ventricle has no regional wall motion abnormalities. The left ventricular internal cavity size was normal in  size. There is  mild left ventricular hypertrophy. Right Ventricle: The right ventricular size is normal. No increase in right ventricular wall thickness. Right ventricular systolic function is normal. Left Atrium: Left atrial size was normal in size. No left atrial/left atrial appendage thrombus was detected. Right Atrium: Right atrial size was normal in size. Pericardium: There is no evidence of pericardial effusion. Mitral Valve: The mitral valve is normal in structure. Mild to moderate mitral valve regurgitation. No evidence of mitral valve stenosis. Tricuspid Valve: The tricuspid valve is normal in structure. Tricuspid valve regurgitation is not demonstrated. No evidence of tricuspid stenosis. Aortic Valve: The aortic valve is normal in structure. There is moderate calcification of the aortic valve. Aortic valve regurgitation is not visualized. No aortic stenosis is present. There is no evidence of aortic valve vegetation. Pulmonic Valve: The pulmonic valve was normal in structure. Pulmonic valve regurgitation is not visualized. No evidence of pulmonic stenosis. Aorta: The aortic root is normal in size and structure. There is mild (Grade II) atheroma plaque involving the aortic arch and descending aorta. IAS/Shunts: No atrial level shunt detected by color flow Doppler. Agitated saline contrast was given intravenously to evaluate for intracardiac shunting. Agitated saline contrast bubble study was negative, with no evidence of any interatrial shunt. Additional Comments: A device lead is visualized. Ida Rogue MD Electronically signed by Ida Rogue MD Signature Date/Time: 06/28/2021/5:38:15 PM    Final (Updated)    US Abdomen Limited RUQ (LIVER/GB)  Result Date: 06/26/2021 CLINICAL DATA:  Right upper quadrant pain. EXAM: ULTRASOUND ABDOMEN LIMITED RIGHT UPPER QUADRANT COMPARISON:  CT abdomen/pelvis 06/24/2021. FINDINGS: Gallbladder: Cholecystectomy. Common bile duct: Diameter: 5 mm, within normal limits.  Liver: No focal lesion identified. Within normal limits in parenchymal echogenicity. Portal vein is patent on color Doppler imaging with normal direction of blood flow towards the liver. IMPRESSION: Cholecystectomy.  No evidence of acute abnormality. Electronically Signed   By: Margaretha Sheffield M.D.   On: 06/26/2021 09:28     Assessment and Plan:    Pneumonia/bacteremia with pacemaker lead vegetation-continue antibiotics.  I have discussed the patient with Dr. Curt Bears.  She will need device extraction.  We will keep n.p.o. after midnight tomorrow night.  Vegetation is large so may need to be done in the OR. Chronic diastolic congestive heart failure-she does not appear to be significantly volume overloaded on examination.  Also with significant prerenal azotemia.  We will hold Demadex and spironolactone for now.  Will need to be resumed prior to discharge. Pulmonary hypertension-felt to likely be secondary to history of pulmonary fibrosis.  We will resume home dose of Opsumit and sildenafil.  Advanced heart failure team will also resume selexipag.  Patient was also on digoxin for RV support but this has been held in the setting of acute renal insufficiency.  Can resume later if renal function improves. Acute on chronic stage III kidney disease-hold diuretics for now.  Continue to follow renal function Paroxysmal atrial fibrillation-she was previously on amiodarone but this was discontinued due to potential pulmonary toxicity.  She is back in atrial fibrillation at present but is tolerating.  Hold apixaban for upcoming device extraction.  Resume once all procedures complete. Anemia-follow hemoglobin.   Risk Assessment/Risk Scores:   Chads vasc 9  Cardiology to follow Kirk Ruths 06/29/21

## 2021-06-29 NOTE — Consult Note (Signed)
error 

## 2021-06-29 NOTE — Assessment & Plan Note (Addendum)
-   Completed course of antibiotics

## 2021-06-29 NOTE — Assessment & Plan Note (Addendum)
-  History of PE and DVT  -Initially Eliquis was placed on hold due to ABLA, now resumed -IVC filter was placed in 2015 at the time of pacemaker placement -H&H stable, continue eliquis

## 2021-06-29 NOTE — Consult Note (Addendum)
Advanced Heart Failure Team Consult Note   Primary Physician: [name], MD PCP-Cardiologist:  [name], MD HF cardiologist: DM  Reason for Consultation: Management of pulmonary HTN  HPI:    Debra Saunders is seen today for evaluation of management of PAH at the request of Dr. Stanford Breed.  Debra Saunders is a 76 y.o. female who has a history of chronic diastolic heart failure, nonobstructive CAD, atrial fibrillation on eliquis, bradycardia s/p PPM [facility] [employer]), chronic respiratory failure on home O2, COPD, PAH, DM2, HTN, CKD (required HD in past), DVT s/p IVC filter 2004, HL, traumatic subarachnoid hemorrhage while on xarelto 2014, CVA 2014, OSA, and chronic lymphedema.  She was admitted to [facility] on 1/3 January 3 with complaints of dyspnea, cough, fevers, chills.  She was found to pneumonia and was in septic shock treated with antibiotics and pressors.  Blood cultures positive for Streptococcus group G.  All her PAH meds were held.  Patient had transesophageal echocardiogram on January 6 which showed a large vegetation on the pacer wire in the right atrium above the tricuspid valve (1 x 1.5 cm).  No vegetations noted on the valves.  Ejection fraction was 55% and there was note of mild to moderate mitral regurgitation. RV ok. She was transferred to [facility] for pacemaker extraction.    Feels weak. Poor appetite/ Had severe RUQ pain but now improving  Afebrile. Denies CP, SOB, orthopnea or PND. SBP 170-180s. Noted to be back in AF. (Previously on amio but stopped due to potential lung toxicity)   Prior to admit she was on sildenafil 20 TID, macitentan 10 daily and selexipag 1600 bid    Review of Systems: [y] = yes, [ ]  = no   General: Weight gain [ ] ; Weight loss [ ] ; Anorexia [ ] ; Fatigue [ y]; Fever [ y]; Chills Blue.Reese ]; Weakness Blue.Reese ]  Cardiac: Chest pain/pressure [ ] ; Resting SOB [ y]; Exertional SOB [ ] ; Orthopnea [ ] ; Pedal Edema [ ] ; Palpitations [ ] ;  Syncope [ ] ; Presyncope [ ] ; Paroxysmal nocturnal dyspnea[ ]   Pulmonary: Cough [ ] ; Wheezing[ ] ; Hemoptysis[ ] ; Sputum [ ] ; Snoring [ ]   GI: Vomiting[ ] ; Dysphagia[ ] ; Melena[ ] ; Hematochezia [ ] ; Heartburn[ ] ; Abdominal pain [ y]; Constipation [ ] ; Diarrhea [ ] ; BRBPR [ ]   GU: Hematuria[ ] ; Dysuria [ ] ; Nocturia[ ]   Vascular: Pain in legs with walking [ ] ; Pain in feet with lying flat [ ] ; Non-healing sores [ ] ; Stroke [ y]; TIA [ ] ; Slurred speech [ ] ;  Neuro: Headaches[ ] ; Vertigo[ ] ; Seizures[ ] ; Paresthesias[ ] ;Blurred vision [ ] ; Diplopia [ ] ; Vision changes [ ]   Ortho/Skin: Arthritis [ y]; Joint pain Blue.Reese ]; Muscle pain [ ] ; Joint swelling [ ] ; Back Pain [ ] ; Rash [ ]   Psych: Depression[ ] ; Anxiety[ ]   Heme: Bleeding problems [ ] ; Clotting disorders [ ] ; Anemia [ ]   Endocrine: Diabetes [ y]; Thyroid dysfunction[ ]   Home Medications Prior to Admission medications   Medication Sig Start Date End Date Taking? Authorizing Provider  acetaminophen (TYLENOL) 325 MG tablet Take 325-650 mg by mouth every 6 (six) hours as needed for mild pain.    [provider]  albuterol (ACCUNEB) 0.63 MG/3ML nebulizer solution Take 1 ampule by nebulization every 6 (six) hours as needed for wheezing.    [provider]  Difluprednate 0.05 % EMUL Place 1 drop into both eyes 2 (two) times daily.  [provider]  DULoxetine (CYMBALTA) 30 MG capsule TAKE 1 CAPSULE BY MOUTH ONCE DAILY FOR 14 DAYS, THEN INCREASE TO 2 CAPSULES ONCE DAILY Patient taking differently: Take 60 mg by mouth daily. 06/06/21   [name], MD  gabapentin (NEURONTIN) 300 MG capsule Take 600 mg by mouth 3 (three) times daily.    [provider]  meclizine (ANTIVERT) 25 MG tablet Take 1 tablet (25 mg total) by mouth 3 (three) times daily as needed for dizziness. 12/25/20   Larey Dresser, MD  Menthol, Topical Analgesic, (BIOFREEZE EX) Apply 1 application topically 4 (four) times daily as needed (for  pain).    [provider]  rosuvastatin (CRESTOR) 10 MG tablet Take 1 tablet by mouth once daily 06/06/21   Larey Dresser, MD    Past Medical History: Past Medical History:  Diagnosis Date   Arthritis    Atrial fibrillation, persistent ([place])    a. s/p TEE-guided DCCV on 08/20/2017   Cardiorenal syndrome    a. 09/2017 Creat rose to 2.7 w/ diuresis-->HD x 2.   Chest pain    a. 01/2012 Cath: nonobs dzs;  c. 04/2014 MV: No ischemia.   Chronic back pain    Chronic combined systolic (congestive) and diastolic (congestive) heart failure ([place])    a. Dx 2005 @ [facility];  b. 02/2014 Echo: EF 55-60%; c. 07/2017: EF 30-35%, diffuse HK, trivial AI, severe MR, moderate TR; d. 09/2017 Echo: EF 40-45%; e. 02/2018 Echo: EF 55-60%, Gr1 DD; f. 08/2019 Echo: EF 60-65%, Gr2 DD, Nl RV fxn, RVSP 49.29mmHg. Mod-Sev MR. Mild to mod TR. Mod PR. RA ~ 32mmHg.   Chronic respiratory failure (HCC)    a. on home O2   Gastritis    History of intracranial hemorrhage    a. 6/14 described by neurosurgery as "small frontal posttraumatic SAH " - pt was on xarelto @ the time.   Hyperlipidemia    Hypertension    Interstitial lung disease (HCC)    Lipoma of back    Lymphedema    a. Chronic LLE edema.   NICM (nonischemic cardiomyopathy) ([place])    a. 09/2017 Echo: EF 40-45%; b. 09/2017 Cath: minor, insignificant CAD; c. 02/2018 Echo: EF 55-60%, Gr1 DD; d. 08/2019 Echo: EF 60-65%, Gr2 DD.   Obesity    PAH (pulmonary artery hypertension) ([place])    a. 09/2017 Echo: PASP 75mmHg; b. 09/2017 RHC: RA 23, RV 84/13, PA 84/29, PCWP 20, CO 3.89, CI 1.89. LVEDP 18.   Presence of permanent cardiac pacemaker 01/09/2014   Sepsis with metabolic encephalopathy ([place])    Sleep apnea    Streptococcal bacteremia    Stroke [facility]) 2014   Subarachnoid hemorrhage ([place]) 01/19/2013   Symptomatic bradycardia    a. s/p BSX PPM.   Thyroid nodule    Type II diabetes mellitus ([place])    Urinary incontinence     Past Surgical History: Past Surgical History:   Procedure Laterality Date   ABDOMINAL HYSTERECTOMY  1969   BREAST BIOPSY Right 10/28/2019   lymph node bx, [place] Bx, pending path    CARDIAC CATHETERIZATION  2013   @ [place]: No obstructive CAD: Only 20% ostial left circumflex.   CARDIOVERSION N/A 10/12/2017   Procedure: CARDIOVERSION;  Surgeon: Minna Merritts, MD;  Location: [facility] ORS;  Service: Cardiovascular;  Laterality: N/A;   CATARACT EXTRACTION W/PHACO Right 09/19/2020   Procedure: CATARACT EXTRACTION PHACO AND INTRAOCULAR LENS PLACEMENT (IOC) RIGHT DIABETIC 3.95 00:43.0 9.2%;  Surgeon: Leandrew Koyanagi,  MD;  Location: [place];  Service: Ophthalmology;  Laterality: Right;  Pt requests to be last sleep apnea   CATARACT EXTRACTION W/PHACO Left 10/03/2020   Procedure: CATARACT EXTRACTION PHACO AND INTRAOCULAR LENS PLACEMENT ([place]) LEFT DIABETIC;  Surgeon: Leandrew Koyanagi, MD;  Location: [place];  Service: Ophthalmology;  Laterality: Left;  3.25 0:59.3 5.5%   CHOLECYSTECTOMY     COLONOSCOPY WITH PROPOFOL N/A 09/10/2018   Procedure: COLONOSCOPY WITH PROPOFOL;  Surgeon: Manya Silvas, MD;  Location: [facility] ENDOSCOPY;  Service: Endoscopy;  Laterality: N/A;   CYST REMOVAL TRUNK  2002   BACK   DIALYSIS/PERMA CATHETER INSERTION N/A 10/06/2017   Procedure: DIALYSIS/PERMA CATHETER INSERTION;  Surgeon: Katha Cabal, MD;  Location: [place] CV LAB;  Service: Cardiovascular;  Laterality: N/A;   INSERT / REPLACE / REMOVE PACEMAKER     lipoma removal  2014   back   PACEMAKER INSERTION  2015   [employer] dual chamber pacemaker implanted by Dr Caryl Comes for symptomatic bradycardia   PERMANENT PACEMAKER INSERTION N/A 01/09/2014   Procedure: PERMANENT PACEMAKER INSERTION;  Surgeon: Deboraha Sprang, MD;  Location: [facility] CATH LAB;  Service: Cardiovascular;  Laterality: N/A;   RIGHT HEART CATH N/A 04/19/2018   Procedure: RIGHT HEART CATH;  Surgeon: Larey Dresser, MD;  Location: [place] CV LAB;  Service:  Cardiovascular;  Laterality: N/A;   RIGHT HEART CATH N/A 08/30/2020   Procedure: RIGHT HEART CATH;  Surgeon: Larey Dresser, MD;  Location: [place] CV LAB;  Service: Cardiovascular;  Laterality: N/A;   RIGHT/LEFT HEART CATH AND CORONARY ANGIOGRAPHY N/A 10/19/2017   Procedure: RIGHT/LEFT HEART CATH AND CORONARY ANGIOGRAPHY;  Surgeon: Wellington Hampshire, MD;  Location: [place] CV LAB;  Service: Cardiovascular;  Laterality: N/A;   TEE WITHOUT CARDIOVERSION N/A 08/20/2017   Procedure: TRANSESOPHAGEAL ECHOCARDIOGRAM (TEE) & Direct current cardioversion;  Surgeon: Minna Merritts, MD;  Location: [facility] OR[facility];  Service: Cardiovascular;  Laterality: N/A;   TEE WITHOUT CARDIOVERSION N/A 06/28/2021   Procedure: TRANSESOPHAGEAL ECHOCARDIOGRAM (TEE);  Surgeon: Minna Merritts, MD;  Location: [facility] ORS;  Service: Cardiovascular;  Laterality: N/A;   TRIGGER FINGER RELEASE      Family History: Family History  Problem Relation Age of Onset   Heart disease Mother    Hypertension Mother    COPD Mother        was a smoker   Thyroid disease Mother    Hypertension Father    Hypertension Sister    Thyroid disease Sister    Breast cancer Sister    Hypertension Sister    Thyroid disease Sister    Bone cancer Sister    Breast cancer Maternal Aunt    Kidney disease Neg Hx    Bladder Cancer Neg Hx     Social History: Social History   Socioeconomic History   Marital status: Divorced    Spouse name: Not on file   Number of children: 4   Years of education: 12   Highest education level: High school graduate  Occupational History   Occupation: Retired- factory work    Comment: exposed to Pharmacist, community  Tobacco Use   Smoking status: Former    Packs/day: 0.30    Years: 2.00    Pack years: 0.60    Types: Cigarettes    Quit date: 06/23/1990    Years since quitting: 31.0   Smokeless tobacco: Never  Vaping Use   Vaping Use: Never used  Substance and Sexual Activity  Alcohol use: No   Drug use:  No   Sexual activity: Not Currently  Other Topics Concern   Not on file  Social History Narrative   Lives in [place] with daughter. Has 4 children. No pets.      Work - Restaurant manager, fast food, retired      Diet - regular      02/14/2019: reports has Meals On Wheels delivered to her home;       Remote hx of smoking > 30 years; No alcohol-    Social Determinants of Health   Financial Resource Strain: Low Risk    Difficulty of Paying Living Expenses: Not very hard  Food Insecurity: No Food Insecurity   Worried About Charity fundraiser in the Last Year: Never true   Ran Out of Food in the Last Year: Never true  Transportation Needs: No Transportation Needs   Lack of Transportation (Medical): No   Lack of Transportation (Non-Medical): No  Physical Activity: Not on file  Stress: No Stress Concern Present   Feeling of Stress : Only a little  Social Connections: Socially Isolated   Frequency of Communication with Friends and Family: More than three times a week   Frequency of Social Gatherings with Friends and Family: Once a week   Attends Religious Services: Never   Marine scientist or Organizations: No   Attends Archivist Meetings: Never   Marital Status: Divorced    Allergies:  Allergies  Allergen Reactions   Aspirin Hives and Other (See Comments)    Pt states that she is unable to take because she had bleeding in her brain   Other Other (See Comments)    Pt states that she is unable to take blood thinners because she had bleeding in her brain; Blood Thinners-per doctor at [facility] (Xarelto)   Penicillins Hives, Shortness Of Breath, Swelling and Other (See Comments)    Has patient had a PCN reaction causing immediate rash, facial/tongue/throat swelling, SOB or lightheadedness with hypotension: Yes Has patient had a PCN reaction causing severe rash involving mucus membranes or skin necrosis: No Has patient had a PCN reaction that required hospitalization No Has  patient had a PCN reaction occurring within the last 10 years: No If all of the above answers are "NO", then may proceed with Cephalosporin use.    Objective:    Vital Signs:   Temp:  [97.5 F (36.4 C)-98.6 F (37 C)] 98.6 F (37 C) (01/07 1156) Pulse Rate:  [70-106] 100 (01/07 1300) Resp:  [16-19] 18 (01/07 1300) BP: (137-189)/(55-109) 185/109 (01/07 1300) SpO2:  [90 %-97 %] 96 % (01/07 1300) Weight:  [92.8 kg] 92.8 kg (01/07 0211) Last BM Date: 06/28/21  Intake/Output:  No intake or output data in the 24 hours ending 06/29/21 1512    Physical Exam    General:  Obese woman sitting up in bed  No resp difficulty HEENT: normal Neck: supple. JVP not elevated Carotids 2+ bilat; no bruits. No lymphadenopathy or thyromegaly appreciated. Cor: PMI nondisplaced. Irregular rate & rhythm. No rubs, gallops or murmurs. Lungs: clear Abdomen: obese soft, minimally tender, nondistended. No hepatosplenomegaly. No bruits or masses. Good bowel sounds. Extremities: no cyanosis, clubbing, rash, edema Neuro: alert & orientedx3, cranial nerves grossly intact. moves all 4 extremities w/o difficulty. Affect pleasant   Telemetry   AF 90-105 Personally reviewed  Labs   Basic Metabolic Panel: Recent Labs  Lab 06/26/21 0847 06/27/21 0828 06/27/21 1749 06/28/21 0500 06/29/21 1254  NA 124* 129* 131* 134* 140  K 5.0 4.5 4.5 3.8 3.9  CL 95* 95* 97* 100 96*  CO2 18* 24 25 30  34*  GLUCOSE 224* 227* 257* 242* 226*  BUN 96* 90* 84* 76* 31*  CREATININE 3.02* 2.05* 1.68* 1.46* 0.96  CALCIUM 7.7* 7.2* 7.3* 7.5* 8.3*    Liver Function Tests: Recent Labs  Lab 06/24/21 1456 06/25/21 1350 06/26/21 0847 06/28/21 0500 06/29/21 1254  AST 19 21 16  13* 14*  ALT 12 13 11 12 11   ALKPHOS 128* 126 137* 123 126  BILITOT 2.7* 3.0* 1.9* 1.0 1.1  PROT 6.3* 6.2* 6.6 6.3* 6.4*  ALBUMIN 2.5* 2.2* 2.3* 2.4* 2.3*   Recent Labs  Lab 06/24/21 1456  LIPASE 45   No results for input(s): AMMONIA in the  last 168 hours.  CBC: Recent Labs  Lab 06/25/21 2243 06/26/21 0847 06/28/21 0500 06/29/21 0507 06/29/21 1254  WBC 22.8* 14.3* 9.3 15.8* 18.3*  NEUTROABS  --   --  7.3 11.9*  --   HGB 8.2* 8.2* 7.7* 8.0* 8.9*  HCT 25.3* 25.8* 23.4* 24.4* 27.1*  MCV 85.5 84.3 83.9 83.6 84.2  PLT 87* 93* 118* 135* 155    Cardiac Enzymes: No results for input(s): CKTOTAL, CKMB, CKMBINDEX, TROPONINI in the last 168 hours.  BNP: BNP (last 3 results) Recent Labs    08/24/20 1506 03/27/21 1530 06/25/21 1350  BNP 51.2 31.1 344.7*    ProBNP (last 3 results) No results for input(s): PROBNP in the last 8760 hours.   CBG: Recent Labs  Lab 06/27/21 0728 06/28/21 0747 06/28/21 1211 06/29/21 0817 06/29/21 1217  GLUCAP 218* 203* 190* 179* 223*    Coagulation Studies: No results for input(s): LABPROT, INR in the last 72 hours.   Imaging   No results found.   Medications:     Current Medications:  Difluprednate  1 drop Both Eyes BID   [START ON 06/30/2021] DULoxetine  60 mg Oral Daily   gabapentin  600 mg Oral TID   insulin aspart  0-9 Units Subcutaneous TID WC   [START ON 06/30/2021] rosuvastatin  10 mg Oral Daily   sodium chloride flush  3 mL Intravenous Q12H    Infusions:  sodium chloride     sodium chloride     cefTRIAXone (ROCEPHIN)  IV 2 g (06/29/21 1355)      Assessment/Plan   1. 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 - ID following  - Continue ceftriaxone - EP following for pacer extraction - BP now improved  2. Acute on chronichypoxic respiratory failure with CAP - has underlying ILD. On home O2 - ? Septic embolization to lung -  ID following  - Continue ceftriaxone  3. Pulmonary hypertension: She [facility].  Based on 4/19 cath, she had severe PAH with PVR 7.2 WU. Chronic hypoxemic  respiratory failure, on home oxygen.  She has a history of remote DVT (2004) but V/Q scan negative for evidence of chronic PE.  High resolution CT chest in 10/19 showed pulmonary fibrosis, and PFTs in 2017 showed severe restriction.  Serological workup for rheumatological diseases was unremarkable.  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 Amiodarone was stopped given lung parenchymal disease. Suspect group 3 or 5  PH, cannot rule out group 1.  - Resume PH meds sildenafil 20 tid, macitentan 10 daily - Ok to resume selexipeg at full dose if stopped for < 3 days/Otherwise restart at 200 bid and titrate back up.  - Was on digoxin for RV support but held due to AKI. Can restart  - Continue CPAP and home oxygen.   4. PAF with tachy-brady syndrome - back in AF now - amio stopped in 2019 due to underlying lung disease.  - Will rate control for now with po cardizem 30q6. Consider short course of amio as needed - Will d/w EP regarding need for temp pacer with device removal - On apixaban PTA. Stopped due to concern over GIB on admit. Hgb now stable. Will start IV heparin   5. AKI due to ATN/shock - resolved - baseline Cr 1.03 - peak Scr  3.21 -> 0.96 today  6. Chronic diastolic HF - volume status stable - continue to hold torsemide for now until po intake picks up - consider SGLT2i prior to d/c  7. Severe HTN - resume home meds - cover with PRN hydrlazine  8. H/o DVT/PE - resume AC  9. DM2 - insulin. - cover with SSI   10. OSA on CPAP - continue CPAP  Length of Stay: 0  Glori Bickers, MD  06/29/2021, 3:12 PM  Advanced Heart Failure Team Pager [phone] (M-F; 7a - 5p)  Please contact [facility] for night-coverage after hours (4p -7a ) and weekends on [email or web address]

## 2021-06-29 NOTE — Progress Notes (Signed)
Subjective:  Patient is complaining of right upper quadrant pain   Antibiotics:  Anti-infectives (From admission, onward)    Start     Dose/Rate Route Frequency Ordered Stop   06/29/21 1330  cefTRIAXone (ROCEPHIN) 2 g in sodium chloride 0.9 % 100 mL IVPB       Note to Pharmacy: 24 hours from last dose at [facility]. Not sure if on 2g?   2 g 200 mL/hr over 30 Minutes Intravenous Every 24 hours 06/29/21 1237         Medications: Scheduled Meds:  Difluprednate  1 drop Both Eyes BID   [START ON 06/30/2021] DULoxetine  60 mg Oral Daily   gabapentin  600 mg Oral TID   insulin aspart  0-9 Units Subcutaneous TID WC   [START ON 06/30/2021] rosuvastatin  10 mg Oral Daily   sodium chloride flush  3 mL Intravenous Q12H   Continuous Infusions:  sodium chloride     sodium chloride     cefTRIAXone (ROCEPHIN)  IV 2 g (06/29/21 1355)   PRN Meds:.sodium chloride, acetaminophen **OR** acetaminophen, albuterol, HYDROcodone-acetaminophen, labetalol, morphine injection, senna-docusate, sodium chloride flush    Objective: Weight change:  No intake or output data in the 24 hours ending 06/29/21 1404 Blood pressure (!) 185/109, pulse 100, temperature 98.6 F (37 C), temperature source Oral, resp. rate 18, SpO2 96 %. Temp:  [97.5 F (36.4 C)-98.6 F (37 C)] 98.6 F (37 C) (01/07 1156) Pulse Rate:  [70-106] 100 (01/07 1300) Resp:  [16-19] 18 (01/07 1300) BP: (127-189)/(55-109) 185/109 (01/07 1300) SpO2:  [90 %-100 %] 96 % (01/07 1300) Weight:  [92.8 kg] 92.8 kg (01/07 0211)  Physical Exam: Physical Exam Constitutional:      General: She is not in acute distress.    Appearance: She is well-developed. She is obese. She is ill-appearing. She is not diaphoretic.  HENT:     Head: Normocephalic and atraumatic.     Right Ear: External ear normal.     Left Ear: External ear normal.     Mouth/Throat:     Pharynx: No oropharyngeal exudate.  Eyes:     General: No scleral icterus.     Conjunctiva/sclera: Conjunctivae normal.     Pupils: Pupils are equal, round, and reactive to light.  Cardiovascular:     Rate and Rhythm: Regular rhythm. Tachycardia present.     Heart sounds: Murmur heard.    No friction rub. No gallop.  Pulmonary:     Effort: Pulmonary effort is normal. No respiratory distress.     Breath sounds: Normal breath sounds. No stridor. No wheezing, rhonchi or rales.  Abdominal:     General: Bowel sounds are normal. There is no distension.     Palpations: Abdomen is soft. There is no mass.     Tenderness: There is no abdominal tenderness.     Hernia: No hernia is present.  Musculoskeletal:        General: No tenderness. Normal range of motion.  Lymphadenopathy:     Cervical: No cervical adenopathy.  Skin:    General: Skin is warm and dry.     Coloration: Skin is not pale.     Findings: No erythema or rash.  Neurological:     General: No focal deficit present.     Mental Status: She is alert and oriented to person, place, and time.     Motor: No abnormal muscle tone.     Coordination: Coordination normal.  Psychiatric:        Mood and Affect: Mood normal.        Behavior: Behavior normal.        Thought Content: Thought content normal.        Cognition and Memory: Memory is impaired.        Judgment: Judgment normal.     CBC:    BMET Recent Labs    06/27/21 1749 06/28/21 0500  NA 131* 134*  K 4.5 3.8  CL 97* 100  CO2 25 30  GLUCOSE 257* 242*  BUN 84* 76*  CREATININE 1.68* 1.46*  CALCIUM 7.3* 7.5*     Liver Panel  Recent Labs    06/28/21 0500  PROT 6.3*  ALBUMIN 2.4*  AST 13*  ALT 12  ALKPHOS 123  BILITOT 1.0  BILIDIR 0.3*  IBILI 0.7       Sedimentation Rate No results for input(s): ESRSEDRATE in the last 72 hours. C-Reactive Protein No results for input(s): CRP in the last 72 hours.  Micro Results: Recent Results (from the past 720 hour(s))  Blood culture (routine x 2)     Status: Abnormal   Collection Time:  06/25/21  2:33 AM   Specimen: BLOOD RIGHT FOREARM  Result Value Ref Range Status   Specimen Description   Final    BLOOD RIGHT FOREARM Performed at [facility], [address] [place], [place] [address]    Special Requests   Final    BOTTLES DRAWN AEROBIC AND ANAEROBIC Blood Culture results may not be optimal due to an inadequate volume of blood received in culture bottles Performed at [facility], [address] [place], [place] [address]    Culture  Setup Time   Final    GRAM POSITIVE COCCI IN BOTH AEROBIC AND ANAEROBIC BOTTLES Organism ID to follow CRITICAL RESULT CALLED TO, READ BACK BY AND VERIFIED WITHArdeen Garland PHARMD 1425 06/25/21 HNM Performed at [facility], [place], [place] [address]    Culture STREPTOCOCCUS GROUP G (A)  Final   Report Status 06/28/2021 FINAL  Final   Organism ID, Bacteria STREPTOCOCCUS GROUP G  Final      Susceptibility   Streptococcus group g - MIC*    CLINDAMYCIN RESISTANT Resistant     AMPICILLIN <=0.25 SENSITIVE Sensitive     ERYTHROMYCIN 2 RESISTANT Resistant     VANCOMYCIN 0.5 SENSITIVE Sensitive     CEFTRIAXONE <=0.12 SENSITIVE Sensitive     LEVOFLOXACIN 0.5 SENSITIVE Sensitive     PENICILLIN Value in next row Sensitive      SENSITIVE0.06    * STREPTOCOCCUS GROUP G  Blood Culture ID Panel (Reflexed)     Status: Abnormal   Collection Time: 06/25/21  2:33 AM  Result Value Ref Range Status   Enterococcus faecalis NOT DETECTED NOT DETECTED Final   Enterococcus Faecium NOT DETECTED NOT DETECTED Final   Listeria monocytogenes NOT DETECTED NOT DETECTED Final   Staphylococcus species NOT DETECTED NOT DETECTED Final   Staphylococcus aureus (BCID) NOT DETECTED NOT DETECTED Final   Staphylococcus epidermidis NOT DETECTED NOT DETECTED Final   Staphylococcus lugdunensis NOT DETECTED NOT DETECTED Final   Streptococcus species DETECTED (A) NOT DETECTED Final    Comment: Not Enterococcus species,  Streptococcus agalactiae, Streptococcus pyogenes, or Streptococcus pneumoniae. CRITICAL RESULT CALLED TO, READ BACK BY AND VERIFIED WITH: MORGAN HICKS PHARMD 1425 06/25/21 HNM    Streptococcus agalactiae NOT DETECTED NOT DETECTED Final   Streptococcus pneumoniae NOT DETECTED  NOT DETECTED Final   Streptococcus pyogenes NOT DETECTED NOT DETECTED Final   A.calcoaceticus-baumannii NOT DETECTED NOT DETECTED Final   Bacteroides fragilis NOT DETECTED NOT DETECTED Final   Enterobacterales NOT DETECTED NOT DETECTED Final   Enterobacter cloacae complex NOT DETECTED NOT DETECTED Final   Escherichia coli NOT DETECTED NOT DETECTED Final   Klebsiella aerogenes NOT DETECTED NOT DETECTED Final   Klebsiella oxytoca NOT DETECTED NOT DETECTED Final   Klebsiella pneumoniae NOT DETECTED NOT DETECTED Final   Proteus species NOT DETECTED NOT DETECTED Final   Salmonella species NOT DETECTED NOT DETECTED Final   Serratia marcescens NOT DETECTED NOT DETECTED Final   Haemophilus influenzae NOT DETECTED NOT DETECTED Final   Neisseria meningitidis NOT DETECTED NOT DETECTED Final   Pseudomonas aeruginosa NOT DETECTED NOT DETECTED Final   Stenotrophomonas maltophilia NOT DETECTED NOT DETECTED Final   Candida albicans NOT DETECTED NOT DETECTED Final   Candida auris NOT DETECTED NOT DETECTED Final   Candida glabrata NOT DETECTED NOT DETECTED Final   Candida krusei NOT DETECTED NOT DETECTED Final   Candida parapsilosis NOT DETECTED NOT DETECTED Final   Candida tropicalis NOT DETECTED NOT DETECTED Final   Cryptococcus neoformans/gattii NOT DETECTED NOT DETECTED Final    Comment: Performed at [facility], [address] [place], [place] [address]  Resp Panel by RT-PCR (Flu A&B, Covid) Nasopharyngeal Swab     Status: None   Collection Time: 06/25/21  3:33 AM   Specimen: Nasopharyngeal Swab; Nasopharyngeal(NP) swabs in vial transport medium  Result Value Ref Range Status   SARS Coronavirus 2 by RT PCR NEGATIVE  NEGATIVE Final    Comment: (NOTE) SARS-CoV-2 target nucleic acids are NOT DETECTED.  The SARS-CoV-2 RNA is generally detectable in upper respiratory specimens during the acute phase of infection. The lowest concentration of SARS-CoV-2 viral copies this assay can detect is 138 copies/mL. A negative result does not preclude SARS-Cov-2 infection and should not be used as the sole basis for treatment or other patient management decisions. A negative result may occur with  improper specimen collection/handling, submission of specimen other than nasopharyngeal swab, presence of viral mutation(s) within the areas targeted by this assay, and inadequate number of viral copies(<138 copies/mL). A negative result must be combined with clinical observations, patient history, and epidemiological information. The expected result is Negative.  Fact Sheet for Patients:  EntrepreneurPulse.com.au  Fact Sheet for Healthcare Providers:  IncredibleEmployment.be  This test is no t yet approved or cleared by the [place] FDA and  has been authorized for detection and/or diagnosis of SARS-CoV-2 by FDA under an Emergency Use Authorization (EUA). This EUA will remain  in effect (meaning this test can be used) for the duration of the COVID-19 declaration under Section 564(b)(1) of the Act, 21 U.S.C.section 360bbb-3(b)(1), unless the authorization is terminated  or revoked sooner.       Influenza A by PCR NEGATIVE NEGATIVE Final   Influenza B by PCR NEGATIVE NEGATIVE Final    Comment: (NOTE) The Xpert Xpress SARS-CoV-2/FLU/RSV plus assay is intended as an aid in the diagnosis of influenza from Nasopharyngeal swab specimens and should not be used as a sole basis for treatment. Nasal washings and aspirates are unacceptable for Xpert Xpress SARS-CoV-2/FLU/RSV testing.  Fact Sheet for Patients: EntrepreneurPulse.com.au  Fact Sheet for Healthcare  Providers: IncredibleEmployment.be  This test is not yet approved or cleared by the [place] FDA and has been authorized for detection and/or diagnosis of SARS-CoV-2 by FDA under an Emergency Use Authorization (  EUA). This EUA will remain in effect (meaning this test can be used) for the duration of the COVID-19 declaration under Section 564(b)(1) of the Act, 21 U.S.C. section 360bbb-3(b)(1), unless the authorization is terminated or revoked.  Performed at [facility], [address] [place], [place] [address]   Blood culture (routine x 2)     Status: Abnormal   Collection Time: 06/25/21  5:55 AM   Specimen: BLOOD  Result Value Ref Range Status   Specimen Description   Final    BLOOD LEFT ARM Performed at [facility], [address], [place], [place] [address]    Special Requests   Final    BOTTLES DRAWN AEROBIC AND ANAEROBIC Blood Culture adequate volume Performed at [facility], [address] [place], [place] [address]    Culture  Setup Time   Final    GRAM POSITIVE COCCI IN BOTH AEROBIC AND ANAEROBIC BOTTLES CRITICAL VALUE NOTED.  VALUE IS CONSISTENT WITH PREVIOUSLY REPORTED AND CALLED VALUE. Performed at [facility], [address] [place], [place] [address]    Culture (A)  Final    STREPTOCOCCUS GROUP G SUSCEPTIBILITIES PERFORMED ON PREVIOUS CULTURE WITHIN THE LAST 5 DAYS. Performed at [facility], [place] [address] [place], [place] [address]    Report Status 06/28/2021 FINAL  Final  Urine Culture     Status: Abnormal   Collection Time: 06/25/21  6:55 AM   Specimen: Urine, Clean Catch  Result Value Ref Range Status   Specimen Description   Final    URINE, CLEAN CATCH Performed at [facility], [address] [place], [place] [address]    Special Requests   Final    NONE Performed at [facility], [place], [place] [address]    Culture >=100,000  COLONIES/mL KLEBSIELLA PNEUMONIAE (A)  Final   Report Status 06/27/2021 FINAL  Final   Organism ID, Bacteria KLEBSIELLA PNEUMONIAE (A)  Final      Susceptibility   Klebsiella pneumoniae - MIC*    AMPICILLIN RESISTANT Resistant     CEFAZOLIN <=4 SENSITIVE Sensitive     CEFEPIME <=0.12 SENSITIVE Sensitive     CEFTRIAXONE <=0.25 SENSITIVE Sensitive     CIPROFLOXACIN <=0.25 SENSITIVE Sensitive     GENTAMICIN <=1 SENSITIVE Sensitive     IMIPENEM <=0.25 SENSITIVE Sensitive     NITROFURANTOIN 32 SENSITIVE Sensitive     TRIMETH/SULFA <=20 SENSITIVE Sensitive     AMPICILLIN/SULBACTAM <=2 SENSITIVE Sensitive     PIP/TAZO <=4 SENSITIVE Sensitive     * >=100,000 COLONIES/mL KLEBSIELLA PNEUMONIAE  C Difficile Quick Screen w PCR reflex     Status: None   Collection Time: 06/25/21  6:06 PM   Specimen: Stool  Result Value Ref Range Status   C Diff antigen NEGATIVE NEGATIVE Final   C Diff toxin NEGATIVE NEGATIVE Final   C Diff interpretation No C. difficile detected.  Final    Comment: Performed at [facility], [address] [place], [place] [address]  MRSA Next Gen by PCR, Nasal     Status: None   Collection Time: 06/25/21 10:55 PM   Specimen: Nasal Mucosa; Nasal Swab  Result Value Ref Range Status   MRSA by PCR Next Gen NOT DETECTED NOT DETECTED Final    Comment: (NOTE) [name] (FDA approved for NASAL specimens only), is one component of a comprehensive MRSA colonization surveillance program. It is not intended to diagnose MRSA infection nor to  guide or monitor treatment for MRSA infections. Test performance is not FDA approved in patients less than 62 years old. Performed at [facility], [address] [place], [place] [address]   Culture, blood (Routine X 2) w Reflex to ID Panel     Status: None (Preliminary result)   Collection Time: 06/27/21  8:29 AM   Specimen: BLOOD RIGHT HAND  Result Value Ref Range Status   Specimen Description BLOOD RIGHT  HAND  Final   Special Requests   Final    BOTTLES DRAWN AEROBIC AND ANAEROBIC Blood Culture adequate volume   Culture   Final    NO GROWTH 2 DAYS Performed at [facility], [address], [place], [place] [address]    Report Status PENDING  Incomplete  Culture, blood (Routine X 2) w Reflex to ID Panel     Status: None (Preliminary result)   Collection Time: 06/27/21  8:29 AM   Specimen: BLOOD RIGHT HAND  Result Value Ref Range Status   Specimen Description BLOOD RIGHT HAND  Final   Special Requests   Final    BOTTLES DRAWN AEROBIC ONLY Blood Culture adequate volume   Culture   Final    NO GROWTH 2 DAYS Performed at [facility], [address], [place], [place] [address]    Report Status PENDING  Incomplete    Studies/Results: ECHO TEE  Result Date: 06/28/2021    TRANSESOPHOGEAL ECHO REPORT   Patient Name:   Debra Saunders Date of Exam: 06/28/2021 Medical Rec #:  [id number]        Height:       67.0 in Accession #:    [id number]       Weight:       192.0 lb Date of Birth:  [birth year]         BSA:          1.988 m Patient Age:    48 years         BP:           119/44 mmHg Patient Gender: F                HR:           71 bpm. Exam Location:  [facility] Procedure: Transesophageal Echo, Cardiac Doppler, Color Doppler and Saline            Contrast Bubble Study Indications:     Bacteremia R78.81  History:         Patient has prior history of Echocardiogram examinations, most                  recent 06/26/2021. Pacemaker, Arrythmias:Atrial Fibrillation;                  Risk Factors:Hypertension.  Sonographer:     Sherrie Sport Referring Phys:  [id number] [name] Diagnosing Phys: Ida Rogue MD PROCEDURE: After discussion of the risks and benefits of a TEE, an informed consent was obtained from the patient. TEE procedure time was 30 minutes. The transesophogeal probe was passed without difficulty through the esophogus of the patient. Imaged were obtained with the patient in a left  lateral decubitus position. Local oropharyngeal anesthetic was provided with Benzocaine spray and Cetacaine. Sedation performed by performing physician. Patients was under conscious sedation during this procedure.  Anesthetic administered: 46mcg of Fentanyl, 3mg  of Versed. Image quality was excellent. The patient's vital signs; including heart rate, blood pressure, and oxygen saturation;  remained stable throughout the procedure. The patient developed no complications during the procedure. IMPRESSIONS  1. Large mobile vegetation on the RV pacing wire noted in the right atrium, with estimated to be 1 x 1.5 cm in size or larger.  2. Left ventricular ejection fraction, by estimation, is 55 to 60%. The left ventricle has normal function. The left ventricle has no regional wall motion abnormalities. There is mild left ventricular hypertrophy.  3. Right ventricular systolic function is normal. The right ventricular size is normal.  4. No left atrial/left atrial appendage thrombus was detected.  5. The mitral valve is normal in structure. Mild to moderate mitral valve regurgitation. No evidence of mitral stenosis.  6. The aortic valve is normal in structure. There is moderate calcification of the aortic valve. Aortic valve regurgitation is not visualized. No aortic stenosis is present.  7. There is mild (Grade II) atheroma plaque involving the aortic arch and descending aorta.  8. Agitated saline contrast bubble study was negative, with no evidence of any interatrial shunt. Conclusion(s)/Recommendation(s): Normal biventricular function without evidence of hemodynamically significant valvular heart disease. FINDINGS  Left Ventricle: Left ventricular ejection fraction, by estimation, is 55 to 60%. The left ventricle has normal function. The left ventricle has no regional wall motion abnormalities. The left ventricular internal cavity size was normal in size. There is  mild left ventricular hypertrophy. Right Ventricle: The  right ventricular size is normal. No increase in right ventricular wall thickness. Right ventricular systolic function is normal. Left Atrium: Left atrial size was normal in size. No left atrial/left atrial appendage thrombus was detected. Right Atrium: Right atrial size was normal in size. Pericardium: There is no evidence of pericardial effusion. Mitral Valve: The mitral valve is normal in structure. Mild to moderate mitral valve regurgitation. No evidence of mitral valve stenosis. Tricuspid Valve: The tricuspid valve is normal in structure. Tricuspid valve regurgitation is not demonstrated. No evidence of tricuspid stenosis. Aortic Valve: The aortic valve is normal in structure. There is moderate calcification of the aortic valve. Aortic valve regurgitation is not visualized. No aortic stenosis is present. There is no evidence of aortic valve vegetation. Pulmonic Valve: The pulmonic valve was normal in structure. Pulmonic valve regurgitation is not visualized. No evidence of pulmonic stenosis. Aorta: The aortic root is normal in size and structure. There is mild (Grade II) atheroma plaque involving the aortic arch and descending aorta. IAS/Shunts: No atrial level shunt detected by color flow Doppler. Agitated saline contrast was given intravenously to evaluate for intracardiac shunting. Agitated saline contrast bubble study was negative, with no evidence of any interatrial shunt. Additional Comments: A device lead is visualized. Ida Rogue MD Electronically signed by Ida Rogue MD Signature Date/Time: 06/28/2021/5:38:15 PM    Final (Updated)       Assessment/Plan:  INTERVAL HISTORY: Patient transferred to [facility]   Principal Problem:   Severe sepsis with septic shock secondary to multifocal pneumonia and streptoccos bacteremia  Active Problems:   Hypertension   Chronic systolic heart failure ([place])   Diabetes ([place])   Pulmonary hypertension, unspecified (HCC)   Hyperlipidemia   OSA  (obstructive sleep apnea)   Acute on chronic respiratory failure with hypoxia (HCC)   Atrial fibrillation ([place])   Acute renal failure superimposed on stage 3a chronic kidney disease (HCC)   DVT (deep venous thrombosis) ([place])   Bacteremia due to Gram-positive bacteria   Bacteremia due to Streptococcus    Debra Saunders is a 76 y.o. female with group  G streptococcal bacteremia with large right ventricle pacing wire vegetation and septic embolization to the lungs.  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  She does not recall what happened afterwards.  She denies having had amoxicillin or other penicillins since then.  Will continue ceftriaxone.  My understanding is her pacemaker will be extracted but likely temporary pacemaker placed.  #2 right upper quadrant pain: From septic embolization to the lungs.  I spent 36 minutes with the patient including than 50% of the time in face to face counseling of the patient regarding her cardiac device associated infection with septic embolization personally reviewing transesophageal echocardiogram CT scan updated blood cultures along with review of medical records in preparation for the visit and during the visit and in coordination of her care with primary team.   LOS: 0 days   Debra Saunders 06/29/2021, 2:04 PM

## 2021-06-29 NOTE — H&P (Signed)
History and Physical    Debra Saunders WUX:[id number] DOB: 08-20-1945 DOA: 06/29/2021  PCP: [name], MD Consultants:  cardiology: Dr. Fletcher Anon, EP: Dr. Caryl Comes, hematology: [name], pulm: Dr. Lake Bells  Patient coming from: [facility]  Chief Complaint: bacteremia secondary to infected pacemaker lead with vegetation-needs pacemaker explantation, severe sepsis with septic shock secondary to streptococcus bacteremia, klebsiella UTI, ?septic pulmonary emboli  HPI: Debra Saunders is a 76 y.o. female with medical history significant of  hypertension, hyperlipidemia, diabetes mellitus, depression, subdural hematoma 2014, sinoatrial node dysfunction (s/p for pacemaker placement, OSA, PAH, interstitial lung disease on 4 L oxygen, dCHF, atrial fibrillation on Eliquis, CKD stage IIIa, cardiorenal syndrome, anemia, DVT on Eliquis, who presented to [facility] on 06/25/2021 with shortness breath, cough, fever, chills, burning on urination, dysuria and RUQ pain x 3 weeks.   Per HPI on 06/25/21 from Dr. Blaine Hamper  Patient states that she has been sick for almost 3 weeks.  She has cough with some mucus production, shortness breath, fever, chills.  Denies chest pain.  Per her daughter (I called her daughter by phone), patient has chronic diarrhea intermittently, but her diarrhea seems to be worse now.  She also complains of some cramping diffuse abdominal pain.  She had 1 episode of dark stool yesterday, not sure if it was blood per her daughter.  Patient has poor appetite and decreased oral intake.  She has burning on urination, no dysuria or frequency.  No unilateral numbness or weakness.    Patient was initially hypotensive with blood pressure 73/26, which improved to 103/54 after giving 2 L LR bolus in ED.  Patient was found to have oxygen desaturation to 87% on home 4 L oxygen, patient has severe respiratory distress, using accessory muscle for breathing.  She was started on BiPAP in ED. Per patient's daughter, patient  does not have a history of COPD.  Summary of her hospital stay from 1/3-/17/23 where transferred to [facility] for admit today.   Severe sepsis with septic shock secondary to multifocal pneumonia (?septic emboli) and strep bacteremia as well as klebsiella UTI. She required peripheral levophed and stress steroids for a short time. Initially on vanc and cefepime, but changed to rocephin. TEE on 1/6 consistent with infected pacemaker lead with vegetation. Also had acute anemia with baseline hgb around 9.8>7. transfused 1 unit. Eliquis held. Seen by GI with no plans for scope. Hgb has remained  stable. She had acute on chronic renal failure and nephrology was consulted likely combo of pre renal, UTI, sepsis, ATN. Has improved back to baseline. Had abnormal LFTs likely shock liver, but this has resolved as well, along with her abdominal pain.   She is in bed and resting comfortably. She states she feels much better, just weak with poor appetite. She no longer has the pain in her RUQ after morphine given. She denies any headache, dizziness, chest pain or palpitations, shortness of breath, abdominal pain, N/V/D, dysuria or leg swelling.    ED Course: pt was found to have WBC 20.1, Hgb 9.0 on 04/29/21 --> 8.1 --> then 7.0, negative COVID PCR, INR 2.1, negative FOBT per ED physician's note, lactic acid 1.0, troponin level 276, lipase 45, urinalysis (cloudy appearance, small amount of leukocyte, many bacteria, WBC 6-10), sodium 128, worsening renal function, temperature 100.1, heart rate of 131, 90, RR 31, 27.  Chest x-ray showed multifocal infiltration.  CT abdomen/pelvis is negative for acute intra-abdominal issues.  Patient is admitted to progressive bed as inpatient.  Review of Systems: As per HPI; otherwise review of systems reviewed and negative.   Ambulatory Status:  Ambulates with rolling walker   Past Medical History:  Diagnosis Date   Arthritis    Atrial fibrillation, persistent ([place])    a. s/p  TEE-guided DCCV on 08/20/2017   Cardiorenal syndrome    a. 09/2017 Creat rose to 2.7 w/ diuresis-->HD x 2.   Chest pain    a. 01/2012 Cath: nonobs dzs;  c. 04/2014 MV: No ischemia.   Chronic back pain    Chronic combined systolic (congestive) and diastolic (congestive) heart failure ([place])    a. Dx 2005 @ [facility];  b. 02/2014 Echo: EF 55-60%; c. 07/2017: EF 30-35%, diffuse HK, trivial AI, severe MR, moderate TR; d. 09/2017 Echo: EF 40-45%; e. 02/2018 Echo: EF 55-60%, Gr1 DD; f. 08/2019 Echo: EF 60-65%, Gr2 DD, Nl RV fxn, RVSP 49.31mmHg. Mod-Sev MR. Mild to mod TR. Mod PR. RA ~ 38mmHg.   Chronic respiratory failure (HCC)    a. on home O2   Gastritis    History of intracranial hemorrhage    a. 6/14 described by neurosurgery as "small frontal posttraumatic SAH " - pt was on xarelto @ the time.   Hyperlipidemia    Hypertension    Interstitial lung disease (HCC)    Lipoma of back    Lymphedema    a. Chronic LLE edema.   NICM (nonischemic cardiomyopathy) ([place])    a. 09/2017 Echo: EF 40-45%; b. 09/2017 Cath: minor, insignificant CAD; c. 02/2018 Echo: EF 55-60%, Gr1 DD; d. 08/2019 Echo: EF 60-65%, Gr2 DD.   Obesity    PAH (pulmonary artery hypertension) ([place])    a. 09/2017 Echo: PASP 22mmHg; b. 09/2017 RHC: RA 23, RV 84/13, PA 84/29, PCWP 20, CO 3.89, CI 1.89. LVEDP 18.   Presence of permanent cardiac pacemaker 01/09/2014   Sepsis with metabolic encephalopathy ([place])    Sleep apnea    Streptococcal bacteremia    Stroke [facility]) 2014   Subarachnoid hemorrhage ([place]) 01/19/2013   Symptomatic bradycardia    a. s/p BSX PPM.   Thyroid nodule    Type II diabetes mellitus ([place])    Urinary incontinence     Past Surgical History:  Procedure Laterality Date   ABDOMINAL HYSTERECTOMY  1969   BREAST BIOPSY Right 10/28/2019   lymph node bx, [place] Bx, pending path    CARDIAC CATHETERIZATION  2013   @ [place]: No obstructive CAD: Only 20% ostial left circumflex.   CARDIOVERSION N/A 10/12/2017   Procedure: CARDIOVERSION;   Surgeon: Minna Merritts, MD;  Location: [facility] ORS;  Service: Cardiovascular;  Laterality: N/A;   CATARACT EXTRACTION W/PHACO Right 09/19/2020   Procedure: CATARACT EXTRACTION PHACO AND INTRAOCULAR LENS PLACEMENT (IOC) RIGHT DIABETIC 3.95 00:43.0 9.2%;  Surgeon: Leandrew Koyanagi, MD;  Location: [place];  Service: Ophthalmology;  Laterality: Right;  Pt requests to be last sleep apnea   CATARACT EXTRACTION W/PHACO Left 10/03/2020   Procedure: CATARACT EXTRACTION PHACO AND INTRAOCULAR LENS PLACEMENT ([place]) LEFT DIABETIC;  Surgeon: Leandrew Koyanagi, MD;  Location: [place];  Service: Ophthalmology;  Laterality: Left;  3.25 0:59.3 5.5%   CHOLECYSTECTOMY     COLONOSCOPY WITH PROPOFOL N/A 09/10/2018   Procedure: COLONOSCOPY WITH PROPOFOL;  Surgeon: Manya Silvas, MD;  Location: [facility] ENDOSCOPY;  Service: Endoscopy;  Laterality: N/A;   CYST REMOVAL TRUNK  2002   BACK   DIALYSIS/PERMA CATHETER INSERTION N/A 10/06/2017   Procedure: DIALYSIS/PERMA CATHETER INSERTION;  Surgeon: [name]  G, MD;  Location: [place] CV LAB;  Service: Cardiovascular;  Laterality: N/A;   INSERT / REPLACE / REMOVE PACEMAKER     lipoma removal  2014   back   PACEMAKER INSERTION  2015   [employer] dual chamber pacemaker implanted by Dr Caryl Comes for symptomatic bradycardia   PERMANENT PACEMAKER INSERTION N/A 01/09/2014   Procedure: PERMANENT PACEMAKER INSERTION;  Surgeon: Deboraha Sprang, MD;  Location: [facility] CATH LAB;  Service: Cardiovascular;  Laterality: N/A;   RIGHT HEART CATH N/A 04/19/2018   Procedure: RIGHT HEART CATH;  Surgeon: Larey Dresser, MD;  Location: [place] CV LAB;  Service: Cardiovascular;  Laterality: N/A;   RIGHT HEART CATH N/A 08/30/2020   Procedure: RIGHT HEART CATH;  Surgeon: Larey Dresser, MD;  Location: [place] CV LAB;  Service: Cardiovascular;  Laterality: N/A;   RIGHT/LEFT HEART CATH AND CORONARY ANGIOGRAPHY N/A 10/19/2017   Procedure: RIGHT/LEFT  HEART CATH AND CORONARY ANGIOGRAPHY;  Surgeon: Wellington Hampshire, MD;  Location: [place] CV LAB;  Service: Cardiovascular;  Laterality: N/A;   TEE WITHOUT CARDIOVERSION N/A 08/20/2017   Procedure: TRANSESOPHAGEAL ECHOCARDIOGRAM (TEE) & Direct current cardioversion;  Surgeon: Minna Merritts, MD;  Location: [facility] ORS;  Service: Cardiovascular;  Laterality: N/A;   TEE WITHOUT CARDIOVERSION N/A 06/28/2021   Procedure: TRANSESOPHAGEAL ECHOCARDIOGRAM (TEE);  Surgeon: Minna Merritts, MD;  Location: [facility] ORS;  Service: Cardiovascular;  Laterality: N/A;   TRIGGER FINGER RELEASE      Social History   Socioeconomic History   Marital status: Divorced    Spouse name: Not on file   Number of children: 4   Years of education: 12   Highest education level: High school graduate  Occupational History   Occupation: Retired- factory work    Comment: 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 Has 4 children. No pets.      Work - Restaurant manager, fast food, retired      Diet - regular      02/14/2019: reports has Meals On Wheels delivered to her home;       Remote hx of smoking > 30 years; No alcohol-    Social Determinants of Health   Financial Resource Strain: Low Risk    Difficulty of Paying Living Expenses: Not very hard  Food Insecurity: No Food Insecurity   Worried About Charity fundraiser in the Last Year: Never true   Ran Out of Food in the Last Year: Never true  Transportation Needs: No Transportation Needs   Lack of Transportation (Medical): No   Lack of Transportation  (Non-Medical): No  Physical Activity: Not on file  Stress: No Stress Concern Present   Feeling of Stress : Only a little  Social Connections: Socially Isolated   Frequency of Communication with Friends and Family: More than three times a week   Frequency of Social Gatherings with Friends and Family: Once a week   Attends Religious Services: Never  Active Member of Clubs or Organizations: No   Attends Archivist Meetings: Never   Marital Status: Divorced  Human resources officer Violence: Not At Risk   Fear of Current or Ex-Partner: No   Emotionally Abused: No   Physically Abused: No   Sexually Abused: No    Allergies  Allergen Reactions   Aspirin Hives and Other (See Comments)    Pt states that she is unable to take because she had bleeding in her brain   Other Other (See Comments)    Pt states that she is unable to take blood thinners because she had bleeding in her brain; Blood Thinners-per doctor at [facility] (Xarelto)   Penicillins Hives, Shortness Of Breath, Swelling and Other (See Comments)    Has patient had a PCN reaction causing immediate rash, facial/tongue/throat swelling, SOB or lightheadedness with hypotension: Yes Has patient had a PCN reaction causing severe rash involving mucus membranes or skin necrosis: No Has patient had a PCN reaction that required hospitalization No Has patient had a PCN reaction occurring within the last 10 years: No If all of the above answers are "NO", then may proceed with Cephalosporin use.    Family History  Problem Relation Age of Onset   Heart disease Mother    Hypertension Mother    COPD Mother        was a smoker   Thyroid disease Mother    Hypertension Father    Hypertension Sister    Thyroid disease Sister    Breast cancer Sister    Hypertension Sister    Thyroid disease Sister    Bone cancer Sister    Breast cancer Maternal Aunt    Kidney disease Neg Hx    Bladder Cancer Neg Hx     Prior to Admission  medications   Medication Sig Start Date End Date Taking? Authorizing Provider  acetaminophen (TYLENOL) 325 MG tablet Take 325-650 mg by mouth every 6 (six) hours as needed for mild pain.    [provider]  albuterol (ACCUNEB) 0.63 MG/3ML nebulizer solution Take 1 ampule by nebulization every 6 (six) hours as needed for wheezing.    [provider]  Difluprednate 0.05 % EMUL Place 1 drop into both eyes 2 (two) times daily.    [provider]  DULoxetine (CYMBALTA) 30 MG capsule TAKE 1 CAPSULE BY MOUTH ONCE DAILY FOR 14 DAYS, THEN INCREASE TO 2 CAPSULES ONCE DAILY Patient taking differently: Take 60 mg by mouth daily. 06/06/21   [name], MD  gabapentin (NEURONTIN) 300 MG capsule Take 600 mg by mouth 3 (three) times daily.    [provider]  meclizine (ANTIVERT) 25 MG tablet Take 1 tablet (25 mg total) by mouth 3 (three) times daily as needed for dizziness. 12/25/20   Larey Dresser, MD  Menthol, Topical Analgesic, (BIOFREEZE EX) Apply 1 application topically 4 (four) times daily as needed (for pain).    [provider]  rosuvastatin (CRESTOR) 10 MG tablet Take 1 tablet by mouth once daily 06/06/21   Larey Dresser, MD    Physical Exam: Vitals:   06/29/21 1400 06/29/21 1539 06/29/21 1600 06/29/21 1700  BP: (!) 179/149 (!) 167/96  (!) 168/84  Pulse: 91 92  79  Resp: 20 18  18   Temp:  97.6 F (36.4 C)    TempSrc:  Oral    SpO2: 90% 96%  98%  Weight:   92.8 kg   Height:   5' (1.524 m)  General:  Appears calm and comfortable and is in NAD Eyes:  PERRL, EOMI, normal lids, iris ENT:  grossly normal hearing, lips & tongue, mmm; appropriate dentition Neck:  no LAD, masses or thyromegaly; no carotid bruits Cardiovascular:  irregularly, irregular, no m/r/g. +LLE edema (chronic)  Respiratory:   bilateral velcro sounds in bases.occasional wheeze in upper lobes.   Normal respiratory effort. Abdomen:  soft, NT, ND, NABS Back:   normal  alignment, no CVAT Skin:  no rash or induration seen on limited exam Musculoskeletal:  globally weak , good ROM, no bony abnormality Lower extremity:    Limited foot exam with no ulcerations.  2+ distal pulses. Psychiatric:  grossly normal mood and affect, speech fluent and appropriate, AOx3 Neurologic:  CN 2-12 grossly intact, moves all extremities in coordinated fashion, sensation intact    Radiological Exams on Admission: Independently reviewed - see discussion in A/P where applicable  ECHO TEE  Result Date: 06/28/2021    TRANSESOPHOGEAL ECHO REPORT   Patient Name:   Debra Saunders Date of Exam: 06/28/2021 Medical Rec #:  [id number]        Height:       67.0 in Accession #:    [id number]       Weight:       192.0 lb Date of Birth:  1946-02-02         BSA:          1.988 m Patient Age:    60 years         BP:           119/44 mmHg Patient Gender: F                HR:           71 bpm. Exam Location:  [facility] Procedure: Transesophageal Echo, Cardiac Doppler, Color Doppler and Saline            Contrast Bubble Study Indications:     Bacteremia R78.81  History:         Patient has prior history of Echocardiogram examinations, most                  recent 06/26/2021. Pacemaker, Arrythmias:Atrial Fibrillation;                  Risk Factors:Hypertension.  Sonographer:     Sherrie Sport Referring Phys:  [id number] [name] Diagnosing Phys: Ida Rogue MD PROCEDURE: After discussion of the risks and benefits of a TEE, an informed consent was obtained from the patient. TEE procedure time was 30 minutes. The transesophogeal probe was passed without difficulty through the esophogus of the patient. Imaged were obtained with the patient in a left lateral decubitus position. Local oropharyngeal anesthetic was provided with Benzocaine spray and Cetacaine. Sedation performed by performing physician. Patients was under conscious sedation during this procedure.  Anesthetic administered: 16mcg of Fentanyl, 3mg  of Versed. Image  quality was excellent. The patient's vital signs; including heart rate, blood pressure, and oxygen saturation; remained stable throughout the procedure. The patient developed no complications during the procedure. IMPRESSIONS  1. Large mobile vegetation on the RV pacing wire noted in the right atrium, with estimated to be 1 x 1.5 cm in size or larger.  2. Left ventricular ejection fraction, by estimation, is 55 to 60%. The left ventricle has normal function. The left ventricle has no regional wall motion abnormalities. There is mild left ventricular hypertrophy.  3. Right ventricular systolic function  is normal. The right ventricular size is normal.  4. No left atrial/left atrial appendage thrombus was detected.  5. The mitral valve is normal in structure. Mild to moderate mitral valve regurgitation. No evidence of mitral stenosis.  6. The aortic valve is normal in structure. There is moderate calcification of the aortic valve. Aortic valve regurgitation is not visualized. No aortic stenosis is present.  7. There is mild (Grade II) atheroma plaque involving the aortic arch and descending aorta.  8. Agitated saline contrast bubble study was negative, with no evidence of any interatrial shunt. Conclusion(s)/Recommendation(s): Normal biventricular function without evidence of hemodynamically significant valvular heart disease. FINDINGS  Left Ventricle: Left ventricular ejection fraction, by estimation, is 55 to 60%. The left ventricle has normal function. The left ventricle has no regional wall motion abnormalities. The left ventricular internal cavity size was normal in size. There is  mild left ventricular hypertrophy. Right Ventricle: The right ventricular size is normal. No increase in right ventricular wall thickness. Right ventricular systolic function is normal. Left Atrium: Left atrial size was normal in size. No left atrial/left atrial appendage thrombus was detected. Right Atrium: Right atrial size was normal  in size. Pericardium: There is no evidence of pericardial effusion. Mitral Valve: The mitral valve is normal in structure. Mild to moderate mitral valve regurgitation. No evidence of mitral valve stenosis. Tricuspid Valve: The tricuspid valve is normal in structure. Tricuspid valve regurgitation is not demonstrated. No evidence of tricuspid stenosis. Aortic Valve: The aortic valve is normal in structure. There is moderate calcification of the aortic valve. Aortic valve regurgitation is not visualized. No aortic stenosis is present. There is no evidence of aortic valve vegetation. Pulmonic Valve: The pulmonic valve was normal in structure. Pulmonic valve regurgitation is not visualized. No evidence of pulmonic stenosis. Aorta: The aortic root is normal in size and structure. There is mild (Grade II) atheroma plaque involving the aortic arch and descending aorta. IAS/Shunts: No atrial level shunt detected by color flow Doppler. Agitated saline contrast was given intravenously to evaluate for intracardiac shunting. Agitated saline contrast bubble study was negative, with no evidence of any interatrial shunt. Additional Comments: A device lead is visualized. Ida Rogue MD Electronically signed by Ida Rogue MD Signature Date/Time: 06/28/2021/5:38:15 PM    Final (Updated)     EKG: Independently reviewed.  afib with rate 103; nonspecific ST changes with no evidence of acute ischemia   Labs on Admission: I have personally reviewed the available labs and imaging studies at the time of the admission.  Pertinent labs:  From today Wbc: 18.3 Hgb: 8.9 TSH: .054 Co2: 34 Glucose: 226 BUN: 31 Creatinine: .96 Digoxin level: <.2  Assessment/Plan * Severe sepsis with septic shock secondary to infected pacemaker with streptoccos bacteremia - (present on admission) 76 year old female initially presenting with septic shock requiring peripheral levophed due to vegetation on pacemaker wire resulting in strep  bacteremia and possible septic lung emboli -sepsis resolved -ID following. Originally on vancomycin/cefepime then transitioned to rocephin on 06/26/21, will need 4-6 weeks, f/u with ID recs -consulted cardiology who will discuss with EP about PPM extraction, likely Monday so will need to be NPO at midnight tomorrow. Started on heparin for [facility], eliquis has been held   Acute renal failure superimposed on stage 3a chronic kidney disease ([place])- (present on admission) Resolved. Creatinine peaked at 3.21 and now below baseline at .96  Nephrology was on case and followed with bicarb Now resolved and nephrology no longer needs to  follow, discussed with them today.  Stopped bicarb. Consult again if needed. Appreciate assistance.    Acute blood loss anemia- (present on admission) Baseline hgb: 9-10 Presented with hgb 8>7 then transfused 1 unit PRBC GI consulted who deferred endoscopy due to her critically ill state Started IV protonix, will continue hgb has remained stable and up to 8.9 today Cards has started IV heparin for AC   klebsiella pneumoniae UTI- (present on admission) On rocephin. (sensitive) No longer has any urinary symptoms.   Atrial fibrillation with tachy brady syndrome - (present on admission) Rate controlled, back in atrial fibrillation Was on amio, but stopped due to lung disease Renal function back to baseline, restart digoxin Continue telemetry eliquis was held due to ABLA, hgb has been stable.  Heparin started per cards F/u on cards further recommendation-will discuss with EP about need for temporary pacer   Acute on chronic respiratory failure with hypoxia ([place])- (present on admission) Chronic respiratory failure secondary to ILD on 4L oxygen Originally requiring bipap, but has resolved and back to baseline   Chronic diastolic CHF (congestive heart failure) ([place])- (present on admission) Appears euvolemic on exam today Echo 06/27/21: EF of 60-65% normal LVF. Diastolic  parameters indeterminate Start back her spironolactone. Per cards: hold Demadex until PO intake picks up I/O  Pulmonary hypertension, unspecified ([place])- (present on admission) Cardiology consulted for PAH/infected PPM Suspect group 3 or 5 PH, cannot rule out group 1.  Restarting home medication now that blood pressure is elevated  DVT (deep venous thrombosis) ([place])- (present on admission) eliquis on hold with acute blood loss anemia requiring 1 unit PRBC and for procedure Does have IVC filter-placed 2015 same time as her PPM  hgb stable, heparin started per cardiology   Hypertension- (present on admission) Per cardiology starting back [place] medication Prn IV parameters   Diabetes ([place]) a1c of 5.4 in 06/2021 Extremely well controlled; however, sugars elevated in setting of severe sepsis/steroids Will start SSI   Hyperlipidemia- (present on admission) LFTs wnl, continue crestor   OSA (obstructive sleep apnea)- (present on admission) Continue cpap at night       Body mass index is 39.96 kg/m.   Level of care: Progressive DVT prophylaxis:  heparin gtt/ted hose  Code Status:  Full - confirmed with patient Family Communication: son and daughter in law Disposition Plan:  The patient is from: home  Anticipated d/c is to: per day team  Requires inpatient hospitalization for IV antibiotics, IVF, close monitoring and surgical procedure to extract infected PPM. Significantly ill and is at significant risk of worsening, requires constant monitoring, assessment and MDM with specialists.    Patient is currently: acutely ill Consults called: cardiology: Dr. Stanford Breed, ID: Dr. Tommy Medal, PT/SLP  Admission status:  inpatient    Orma Flaming MD [facility]   How to contact the [facility] Attending or Consulting provider [place] or covering provider during after hours [place], for this patient?  Check the care team in [facility] and look for a) attending/consulting TR[facility] provider listed and b)  the [facility] team listed Log into [email or web address] and use [place]'s universal password to access. If you do not have the password, please contact the hospital operator. Locate the [facility] provider you are looking for under [facility] and page to a number that you can be directly reached. If you still have difficulty reaching the provider, please page the [facility] (Director on Call) for the Hospitalists listed on amion for assistance.   06/29/2021, 6:39 PM

## 2021-06-29 NOTE — Assessment & Plan Note (Addendum)
-   Cardiology following, resumed sildenafil, macitentan, selexipag - Continue digoxin, on Eliquis, CPAP and home O2

## 2021-06-29 NOTE — Progress Notes (Signed)
ANTICOAGULATION CONSULT NOTE - Follow Up Consult  Pharmacy Consult for Heparin Indication: atrial fibrillation  Allergies  Allergen Reactions   Aspirin Hives and Other (See Comments)    Pt states that she is unable to take because she had bleeding in her brain   Other Other (See Comments)    Pt states that she is unable to take blood thinners because she had bleeding in her brain; Blood Thinners-per doctor at [facility] (Xarelto)   Penicillins Hives, Shortness Of Breath, Swelling and Other (See Comments)    Has patient had a PCN reaction causing immediate rash, facial/tongue/throat swelling, SOB or lightheadedness with hypotension: Yes Has patient had a PCN reaction causing severe rash involving mucus membranes or skin necrosis: No Has patient had a PCN reaction that required hospitalization No Has patient had a PCN reaction occurring within the last 10 years: No If all of the above answers are "NO", then may proceed with Cephalosporin use.    Patient Measurements: Height: 5' (152.4 cm) Weight: 92.8 kg (204 lb 9.4 oz) IBW/kg (Calculated) : 45.5   Vital Signs: Temp: 97.6 F (36.4 C) (01/07 1539) Temp Source: Oral (01/07 1539) BP: 167/96 (01/07 1539) Pulse Rate: 92 (01/07 1539)  Labs: Recent Labs    06/27/21 1749 06/28/21 0500 06/28/21 0500 06/29/21 0507 06/29/21 1254  HGB  --  7.7*   < > 8.0* 8.9*  HCT  --  23.4*  --  24.4* 27.1*  PLT  --  118*  --  135* 155  CREATININE 1.68* 1.46*  --   --  0.96   < > = values in this interval not displayed.    Estimated Creatinine Clearance: 49.9 mL/min (by C-G formula based on SCr of 0.96 mg/dL).    Assessment: 75yof admitted to [facility] with sepsis found to have vegetation on pace maker lead and transferred to [facility] for explant.  Hx PAF last dose apixaban 1/3 PTA - admit hgb fel 7 and pt with black tarry stool.  GI work up conservative management at that time with other illness.  HGB improved 8.9, pltc improved 155  Now in  Afib Discussed with MD - will start lok dose heparin drip - no bolus  Will check aptt and heparin level as apixaban can falsely elevate heparin level until cleared.   Goal of Therapy:  Heparin level 0.3-0.7 units/ml aPTT 66-103 seconds Monitor platelets by anticoagulation protocol: Yes   Plan:  Heparin drip 600 uts/hr  Check aptt and heparin level in am  Monitor s/s bleeding    Bonnita Nasuti Pharm.D. CPP, BCPS Clinical Pharmacist [phone] 06/29/2021 5:01 PM

## 2021-06-29 NOTE — Assessment & Plan Note (Addendum)
-  2D echo 06/27/21: EF of 60-65% normal LVF. Diastolic parameters indeterminate -TEE showed EF of 55 to 60%, RV normal, mild to moderate MR -Creatinine slightly trending up, decreased torsemide to 40 mg in the morning 20 mg p.m.

## 2021-06-29 NOTE — Progress Notes (Signed)
[facility]  ROUNDING NOTE   Subjective:   Patient resting comfortably in bed, ill-appearing States she generally feels better today Poor appetite, but states she is drinking liquids Continues to complain of abdominal discomfort  Creatinine 1.5 Urine output of 1.4 L in preceding 24 hours  Objective:  Vital signs in last 24 hours:  Temp:  [97.5 F (36.4 C)-99.3 F (37.4 C)] 98.4 F (36.9 C) (01/07 0729) Pulse Rate:  [69-106] 85 (01/07 0729) Resp:  [15-27] 17 (01/07 0729) BP: (111-148)/(44-71) 148/57 (01/07 0729) SpO2:  [76 %-100 %] 97 % (01/07 0817) Weight:  [92.8 kg] 92.8 kg (01/07 0211)  Weight change:  Filed Weights   06/25/21 0851 06/29/21 0211  Weight: 87.1 kg 92.8 kg    Intake/Output: I/O last 3 completed shifts: In: 6708.8 [P.O.:720; I.V.:5788.8; IV Piggyback:200] Out: 1400 [Urine:1400]   Intake/Output this shift:  Total I/O In: -  Out: 700 [Urine:700]  Physical Exam: General: NAD, resting quietly  Head: Normocephalic, atraumatic. Moist oral mucosal membranes  Eyes: Anicteric  Lungs:  Diminished bases, 3L [place]  Heart: Irregular rate and rhythm  Abdomen:  Soft, nontender  Extremities:  + peripheral edema in Rt leg.  Neurologic: Nonfocal, moving all four extremities  Skin: No lesions       Basic Metabolic Panel: Recent Labs  Lab 06/25/21 2243 06/26/21 0847 06/27/21 0828 06/27/21 1749 06/28/21 0500  NA 124* 124* 129* 131* 134*  K 5.1 5.0 4.5 4.5 3.8  CL 98 95* 95* 97* 100  CO2 15* 18* 24 25 30   GLUCOSE 201* 224* 227* 257* 242*  BUN 83* 96* 90* 84* 76*  CREATININE 3.21* 3.02* 2.05* 1.68* 1.46*  CALCIUM 8.0* 7.7* 7.2* 7.3* 7.5*     Liver Function Tests: Recent Labs  Lab 06/24/21 1456 06/25/21 1350 06/26/21 0847 06/28/21 0500  AST 19 21 16  13*  ALT 12 13 11 12   ALKPHOS 128* 126 137* 123  BILITOT 2.7* 3.0* 1.9* 1.0  PROT 6.3* 6.2* 6.6 6.3*  ALBUMIN 2.5* 2.2* 2.3* 2.4*    Recent Labs  Lab 06/24/21 1456  LIPASE 45     No results for input(s): AMMONIA in the last 168 hours.  CBC: Recent Labs  Lab 06/25/21 1350 06/25/21 2243 06/26/21 0847 06/28/21 0500 06/29/21 0507  WBC 18.3* 22.8* 14.3* 9.3 15.8*  NEUTROABS  --   --   --  7.3 11.9*  HGB 7.0* 8.2* 8.2* 7.7* 8.0*  HCT 22.0* 25.3* 25.8* 23.4* 24.4*  MCV 85.9 85.5 84.3 83.9 83.6  PLT 75* 87* 93* 118* 135*     Cardiac Enzymes: No results for input(s): CKTOTAL, CKMB, CKMBINDEX, TROPONINI in the last 168 hours.  BNP: Invalid input(s): POCBNP  CBG: Recent Labs  Lab 06/26/21 0747 06/27/21 0728 06/28/21 0747 06/28/21 1211 06/29/21 0817  GLUCAP 212* 218* 203* 190* 179*     Microbiology: Results for orders placed or performed during the hospital encounter of 06/25/21  Blood culture (routine x 2)     Status: Abnormal   Collection Time: 06/25/21  2:33 AM   Specimen: BLOOD RIGHT FOREARM  Result Value Ref Range Status   Specimen Description   Final    BLOOD RIGHT FOREARM Performed at [name], [address] [place], [place] [address]    Special Requests   Final    BOTTLES DRAWN AEROBIC AND ANAEROBIC Blood Culture results may not be optimal due to an inadequate volume of blood received in culture bottles Performed at [facility], [address] [place], [place] [address]    Culture  Setup Time   Final    GRAM POSITIVE COCCI IN BOTH AEROBIC AND ANAEROBIC BOTTLES Organism ID to follow CRITICAL RESULT CALLED TO, READ BACK BY AND VERIFIED WITHArdeen Garland PHARMD 1425 06/25/21 HNM Performed at [facility], [place], [place] [address]    Culture STREPTOCOCCUS GROUP G (A)  Final   Report Status 06/28/2021 FINAL  Final   Organism ID, Bacteria STREPTOCOCCUS GROUP G  Final      Susceptibility   Streptococcus group g - MIC*    CLINDAMYCIN RESISTANT Resistant     AMPICILLIN <=0.25 SENSITIVE Sensitive     ERYTHROMYCIN 2 RESISTANT Resistant     VANCOMYCIN 0.5 SENSITIVE Sensitive      CEFTRIAXONE <=0.12 SENSITIVE Sensitive     LEVOFLOXACIN 0.5 SENSITIVE Sensitive     PENICILLIN Value in next row Sensitive      SENSITIVE0.06    * STREPTOCOCCUS GROUP G  Blood Culture ID Panel (Reflexed)     Status: Abnormal   Collection Time: 06/25/21  2:33 AM  Result Value Ref Range Status   Enterococcus faecalis NOT DETECTED NOT DETECTED Final   Enterococcus Faecium NOT DETECTED NOT DETECTED Final   Listeria monocytogenes NOT DETECTED NOT DETECTED Final   Staphylococcus species NOT DETECTED NOT DETECTED Final   Staphylococcus aureus (BCID) NOT DETECTED NOT DETECTED Final   Staphylococcus epidermidis NOT DETECTED NOT DETECTED Final   Staphylococcus lugdunensis NOT DETECTED NOT DETECTED Final   Streptococcus species DETECTED (A) NOT DETECTED Final    Comment: Not Enterococcus species, Streptococcus agalactiae, Streptococcus pyogenes, or Streptococcus pneumoniae. CRITICAL RESULT CALLED TO, READ BACK BY AND VERIFIED WITH: [place] JKDTOI [id number] 06/25/21 HNM    Streptococcus agalactiae NOT DETECTED NOT DETECTED Final   Streptococcus pneumoniae NOT DETECTED NOT DETECTED Final   Streptococcus pyogenes NOT DETECTED NOT DETECTED Final   A.calcoaceticus-baumannii NOT DETECTED NOT DETECTED Final   Bacteroides fragilis NOT DETECTED NOT DETECTED Final   Enterobacterales NOT DETECTED NOT DETECTED Final   Enterobacter cloacae complex NOT DETECTED NOT DETECTED Final   Escherichia coli NOT DETECTED NOT DETECTED Final   Klebsiella aerogenes NOT DETECTED NOT DETECTED Final   Klebsiella oxytoca NOT DETECTED NOT DETECTED Final   Klebsiella pneumoniae NOT DETECTED NOT DETECTED Final   Proteus species NOT DETECTED NOT DETECTED Final   Salmonella species NOT DETECTED NOT DETECTED Final   Serratia marcescens NOT DETECTED NOT DETECTED Final   Haemophilus influenzae NOT DETECTED NOT DETECTED Final   Neisseria meningitidis NOT DETECTED NOT DETECTED Final   Pseudomonas aeruginosa NOT DETECTED NOT DETECTED  Final   Stenotrophomonas maltophilia NOT DETECTED NOT DETECTED Final   Candida albicans NOT DETECTED NOT DETECTED Final   Candida auris NOT DETECTED NOT DETECTED Final   Candida glabrata NOT DETECTED NOT DETECTED Final   Candida krusei NOT DETECTED NOT DETECTED Final   Candida parapsilosis NOT DETECTED NOT DETECTED Final   Candida tropicalis NOT DETECTED NOT DETECTED Final   Cryptococcus neoformans/gattii NOT DETECTED NOT DETECTED Final    Comment: Performed at [facility], [address] [place],  [address]  Resp Panel by RT-PCR (Flu A&B, Covid) Nasopharyngeal Swab     Status: None   Collection Time: 06/25/21  3:33 AM   Specimen: Nasopharyngeal Swab; Nasopharyngeal(NP) swabs in vial transport medium  Result Value Ref Range Status   SARS Coronavirus 2 by RT PCR NEGATIVE NEGATIVE Final    Comment: (NOTE) SARS-CoV-2  target nucleic acids are NOT DETECTED.  The SARS-CoV-2 RNA is generally detectable in upper respiratory specimens during the acute phase of infection. The lowest concentration of SARS-CoV-2 viral copies this assay can detect is 138 copies/mL. A negative result does not preclude SARS-Cov-2 infection and should not be used as the sole basis for treatment or other patient management decisions. A negative result may occur with  improper specimen collection/handling, submission of specimen other than nasopharyngeal swab, presence of viral mutation(s) within the areas targeted by this assay, and inadequate number of viral copies(<138 copies/mL). A negative result must be combined with clinical observations, patient history, and epidemiological information. The expected result is Negative.  Fact Sheet for Patients:  EntrepreneurPulse.com.au  Fact Sheet for Healthcare Providers:  IncredibleEmployment.be  This test is no t yet approved or cleared by the [place] FDA and  has been authorized for detection and/or  diagnosis of SARS-CoV-2 by FDA under an Emergency Use Authorization (EUA). This EUA will remain  in effect (meaning this test can be used) for the duration of the COVID-19 declaration under Section 564(b)(1) of the Act, 21 U.S.C.section 360bbb-3(b)(1), unless the authorization is terminated  or revoked sooner.       Influenza A by PCR NEGATIVE NEGATIVE Final   Influenza B by PCR NEGATIVE NEGATIVE Final    Comment: (NOTE) The Xpert Xpress SARS-CoV-2/FLU/RSV plus assay is intended as an aid in the diagnosis of influenza from Nasopharyngeal swab specimens and should not be used as a sole basis for treatment. Nasal washings and aspirates are unacceptable for Xpert Xpress SARS-CoV-2/FLU/RSV testing.  Fact Sheet for Patients: EntrepreneurPulse.com.au  Fact Sheet for Healthcare Providers: IncredibleEmployment.be  This test is not yet approved or cleared by the [place] FDA and has been authorized for detection and/or diagnosis of SARS-CoV-2 by FDA under an Emergency Use Authorization (EUA). This EUA will remain in effect (meaning this test can be used) for the duration of the COVID-19 declaration under Section 564(b)(1) of the Act, 21 U.S.C. section 360bbb-3(b)(1), unless the authorization is terminated or revoked.  Performed at [facility], [address] [place], [place] [address]   Blood culture (routine x 2)     Status: Abnormal   Collection Time: 06/25/21  5:55 AM   Specimen: BLOOD  Result Value Ref Range Status   Specimen Description   Final    BLOOD LEFT ARM Performed at [facility], [address], [place], [place] [address]    Special Requests   Final    BOTTLES DRAWN AEROBIC AND ANAEROBIC Blood Culture adequate volume Performed at [facility], [address], [place], [place] [address]    Culture  Setup Time   Final    GRAM POSITIVE COCCI IN BOTH AEROBIC AND ANAEROBIC BOTTLES CRITICAL VALUE  NOTED.  VALUE IS CONSISTENT WITH PREVIOUSLY REPORTED AND CALLED VALUE. Performed at [facility], [address] [place], [place] [address]    Culture (A)  Final    STREPTOCOCCUS GROUP G SUSCEPTIBILITIES PERFORMED ON PREVIOUS CULTURE WITHIN THE LAST 5 DAYS. Performed at [facility], [place] [address], [place], [place] [address]    Report Status 06/28/2021 FINAL  Final  Urine Culture     Status: Abnormal   Collection Time: 06/25/21  6:55 AM   Specimen: Urine, Clean Catch  Result Value Ref Range Status   Specimen Description   Final    URINE, CLEAN CATCH Performed at [facility], [place], [place]  [address]    Special Requests   Final    NONE Performed at [facility], [place], [place] [address]    Culture >=100,000 COLONIES/mL KLEBSIELLA PNEUMONIAE (A)  Final   Report Status 06/27/2021 FINAL  Final   Organism ID, Bacteria KLEBSIELLA PNEUMONIAE (A)  Final      Susceptibility   Klebsiella pneumoniae - MIC*    AMPICILLIN RESISTANT Resistant     CEFAZOLIN <=4 SENSITIVE Sensitive     CEFEPIME <=0.12 SENSITIVE Sensitive     CEFTRIAXONE <=0.25 SENSITIVE Sensitive     CIPROFLOXACIN <=0.25 SENSITIVE Sensitive     GENTAMICIN <=1 SENSITIVE Sensitive     IMIPENEM <=0.25 SENSITIVE Sensitive     NITROFURANTOIN 32 SENSITIVE Sensitive     TRIMETH/SULFA <=20 SENSITIVE Sensitive     AMPICILLIN/SULBACTAM <=2 SENSITIVE Sensitive     PIP/TAZO <=4 SENSITIVE Sensitive     * >=100,000 COLONIES/mL KLEBSIELLA PNEUMONIAE  C Difficile Quick Screen w PCR reflex     Status: None   Collection Time: 06/25/21  6:06 PM   Specimen: Stool  Result Value Ref Range Status   C Diff antigen NEGATIVE NEGATIVE Final   C Diff toxin NEGATIVE NEGATIVE Final   C Diff interpretation No C. difficile detected.  Final    Comment: Performed at [facility], [address] [place], [place] [address]  MRSA Next Gen by PCR, Nasal     Status: None    Collection Time: 06/25/21 10:55 PM   Specimen: Nasal Mucosa; Nasal Swab  Result Value Ref Range Status   MRSA by PCR Next Gen NOT DETECTED NOT DETECTED Final    Comment: (NOTE) [name] (FDA approved for NASAL specimens only), is one component of a comprehensive MRSA colonization surveillance program. It is not intended to diagnose MRSA infection nor to guide or monitor treatment for MRSA infections. Test performance is not FDA approved in patients less than 41 years old. Performed at [facility], [address] [place], [place] [address]   Culture, blood (Routine X 2) w Reflex to ID Panel     Status: None (Preliminary result)   Collection Time: 06/27/21  8:29 AM   Specimen: BLOOD RIGHT HAND  Result Value Ref Range Status   Specimen Description BLOOD RIGHT HAND  Final   Special Requests   Final    BOTTLES DRAWN AEROBIC AND ANAEROBIC Blood Culture adequate volume   Culture   Final    NO GROWTH 2 DAYS Performed at [facility], [address], [place], [place] [address]    Report Status PENDING  Incomplete  Culture, blood (Routine X 2) w Reflex to ID Panel     Status: None (Preliminary result)   Collection Time: 06/27/21  8:29 AM   Specimen: BLOOD RIGHT HAND  Result Value Ref Range Status   Specimen Description BLOOD RIGHT HAND  Final   Special Requests   Final    BOTTLES DRAWN AEROBIC ONLY Blood Culture adequate volume   Culture   Final    NO GROWTH 2 DAYS Performed at [facility], [address], [place], [place] [address]    Report Status PENDING  Incomplete   *Note: Due to a large number of results and/or encounters for the requested time period, some results have not been displayed. A complete set of results can be found in Results Review.    Coagulation Studies: No results for input(s): LABPROT, INR in the last 72 hours.  Urinalysis: No results for input(s): COLORURINE, LABSPEC, PHURINE, GLUCOSEU, HGBUR, BILIRUBINUR,  KETONESUR, PROTEINUR, UROBILINOGEN, NITRITE, LEUKOCYTESUR in the last 72 hours.  Invalid input(s): APPERANCEUR     Imaging: ECHO TEE  Result Date: 06/28/2021    TRANSESOPHOGEAL ECHO REPORT   Patient Name:   Debra Saunders Date of Exam: 06/28/2021 Medical Rec #:  [id number]        Height:       67.0 in Accession #:    [id number]       Weight:       192.0 lb Date of Birth:  10/08/1945         BSA:          1.988 m Patient Age:    56 years         BP:           119/44 mmHg Patient Gender: F                HR:           71 bpm. Exam Location:  [facility] Procedure: Transesophageal Echo, Cardiac Doppler, Color Doppler and Saline            Contrast Bubble Study Indications:     Bacteremia R78.81  History:         Patient has prior history of Echocardiogram examinations, most                  recent 06/26/2021. Pacemaker, Arrythmias:Atrial Fibrillation;                  Risk Factors:Hypertension.  Sonographer:     Sherrie Sport Referring Phys:  [id number] [name] Diagnosing Phys: Ida Rogue MD PROCEDURE: After discussion of the risks and benefits of a TEE, an informed consent was obtained from the patient. TEE procedure time was 30 minutes. The transesophogeal probe was passed without difficulty through the esophogus of the patient. Imaged were obtained with the patient in a left lateral decubitus position. Local oropharyngeal anesthetic was provided with Benzocaine spray and Cetacaine. Sedation performed by performing physician. Patients was under conscious sedation during this procedure.  Anesthetic administered: 72mcg of Fentanyl, 3mg  of Versed. Image quality was excellent. The patient's vital signs; including heart rate, blood pressure, and oxygen saturation; remained stable throughout the procedure. The patient developed no complications during the procedure. IMPRESSIONS  1. Large mobile vegetation on the RV pacing wire noted in the right atrium, with estimated to be 1 x 1.5 cm in size or larger.  2. Left ventricular  ejection fraction, by estimation, is 55 to 60%. The left ventricle has normal function. The left ventricle has no regional wall motion abnormalities. There is mild left ventricular hypertrophy.  3. Right ventricular systolic function is normal. The right ventricular size is normal.  4. No left atrial/left atrial appendage thrombus was detected.  5. The mitral valve is normal in structure. Mild to moderate mitral valve regurgitation. No evidence of mitral stenosis.  6. The aortic valve is normal in structure. There is moderate calcification of the aortic valve. Aortic valve regurgitation is not visualized. No aortic stenosis is present.  7. There is mild (Grade II) atheroma plaque involving the aortic arch and descending aorta.  8. Agitated saline contrast bubble study was negative, with no evidence of any interatrial shunt. Conclusion(s)/Recommendation(s): Normal biventricular function without evidence of hemodynamically significant valvular heart disease. FINDINGS  Left Ventricle: Left ventricular ejection fraction, by estimation, is 55 to 60%. The left  ventricle has normal function. The left ventricle has no regional wall motion abnormalities. The left ventricular internal cavity size was normal in size. There is  mild left ventricular hypertrophy. Right Ventricle: The right ventricular size is normal. No increase in right ventricular wall thickness. Right ventricular systolic function is normal. Left Atrium: Left atrial size was normal in size. No left atrial/left atrial appendage thrombus was detected. Right Atrium: Right atrial size was normal in size. Pericardium: There is no evidence of pericardial effusion. Mitral Valve: The mitral valve is normal in structure. Mild to moderate mitral valve regurgitation. No evidence of mitral valve stenosis. Tricuspid Valve: The tricuspid valve is normal in structure. Tricuspid valve regurgitation is not demonstrated. No evidence of tricuspid stenosis. Aortic Valve: The  aortic valve is normal in structure. There is moderate calcification of the aortic valve. Aortic valve regurgitation is not visualized. No aortic stenosis is present. There is no evidence of aortic valve vegetation. Pulmonic Valve: The pulmonic valve was normal in structure. Pulmonic valve regurgitation is not visualized. No evidence of pulmonic stenosis. Aorta: The aortic root is normal in size and structure. There is mild (Grade II) atheroma plaque involving the aortic arch and descending aorta. IAS/Shunts: No atrial level shunt detected by color flow Doppler. Agitated saline contrast was given intravenously to evaluate for intracardiac shunting. Agitated saline contrast bubble study was negative, with no evidence of any interatrial shunt. Additional Comments: A device lead is visualized. Ida Rogue MD Electronically signed by Ida Rogue MD Signature Date/Time: 06/28/2021/5:38:15 PM    Final (Updated)      Medications:    cefTRIAXone (ROCEPHIN)  IV 2 g (06/29/21 0517)    sodium bicarbonate (isotonic) infusion in sterile water 100 mL/hr at 06/29/21 0320    cholestyramine  4 g Oral Daily   Difluprednate  1 drop Both Eyes BID   DULoxetine  60 mg Oral Daily   gabapentin  300 mg Oral BID   ipratropium-albuterol  3 mL Nebulization Q4H   methylPREDNISolone (SOLU-MEDROL) injection  40 mg Intravenous Daily   midodrine  10 mg Oral TID WC   pantoprazole (PROTONIX) IV  40 mg Intravenous Q12H   Ensure Max Protein  11 oz Oral Daily   rosuvastatin  10 mg Oral Daily   acetaminophen, albuterol, dextromethorphan-guaiFENesin, meclizine, Menthol, morphine injection, ondansetron (ZOFRAN) IV  Assessment/ Plan:  [name] is a 76 y.o.  female  with medical problems of hypertension, hyperlipidemia, diabetes, depression, subdural hematoma, sinoatrial node dysfunction with history of pacemaker placement, history of obstructive sleep apnea, pulmonary arterial hypertension, interstitial lung disease on  chronic 4 L oxygen, diastolic CHF, atrial fibrillation, anemia, history of DVT  was admitted on 06/25/2021 for Hyponatremia [E87.1] Thrombocytopenia ([place]) [D69.6] Demand ischemia ([place]) [I24.8] Acute respiratory failure with hypoxia ([place]) [J96.01] AKI (acute kidney injury) ([place]) [N17.9] Abdominal pain [R10.9] Septic shock ([place]) [A41.9, R65.21] Sepsis due to pneumonia ([place]) [J18.9, A41.9] Multifocal pneumonia [J18.9] Acute on chronic anemia [D64.9] Sepsis with acute organ dysfunction without septic shock, due to unspecified organism, unspecified type ([place]) [A41.9, R65.20]  Urinalysis June 25, 2021: Small leukocytes, 30 mg protein, many bacteria, 6-10 WBCs, 0-5 RBCs. Urine culture greater than 100,000 Klebsiella pneumoniae Imaging: CT abdomen noncontrast June 24, 2021:Atherosclerotic disease of abdominal aorta, IVC filter present, normal kidneys and bladder.  Acute Kidney Injury with hyponatremia on chronic kidney disease stage 3b with baseline creatinine 1.04 and GFR of 55 on 04/29/21.  Acute kidney injury secondary to concurrent sepsis and hypotension No  indication of dialysis at this time.   Creatinine improving slowly  Sodium 134, awaiting daily labs  Continue IV fluids at this time  Lab Results  Component Value Date   CREATININE 1.46 (H) 06/28/2021   CREATININE 1.68 (H) 06/27/2021   CREATININE 2.05 (H) 06/27/2021    Intake/Output Summary (Last 24 hours) at 06/29/2021 0950 Last data filed at 06/29/2021 0819 Gross per 24 hour  Intake 6608.81 ml  Output 2100 ml  Net 4508.81 ml    2. Sepsis-Streptococcus bacteremia and Klebsiella pneumoniae in urine, multifocal pneumonia.  Large mobile vegetation found on RV pacing wire. Currently awaiting transfer to [facility] for pacemaker wire removal.  Receiving Rocephin.   3. 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  Home management includes apixaban, currently held    LOS: 4 Kayhan Boardley 1/7/20239:50 AM

## 2021-06-29 NOTE — Assessment & Plan Note (Addendum)
-  Chronic respiratory failure secondary to ILD, pulmonary hypertension on 4L oxygen, originally required BiPAP, now back to baseline -Continue CPAP qhs for OSA

## 2021-06-29 NOTE — Progress Notes (Signed)
Pharmacy Consult for Pulmonary Hypertension Treatment   Indication - Continuation of prior to admission medication   Patient is 76 y.o.  with history of PAH on chronic Macitentan (Opsumit) PTA and will be continued while hospitalized.   Continuing this medication order as an inpatient requires that monitoring parameters per REMS requirements must be met.  Chronic therapy is under the supervision of Dr [name] who is enrolled in the REMS program and is being notified of continuation of therapy. A staff message in EPIC has been sent notifying the certified prescriber.  Per patient report has previously been educated on Pregnancy Risk and Hepatotoxicity . On admission pregnancy risk has been assessed and no monitoring required. Hepatic function has been evaluated. AST/ALT appropriate to continue medication at this time.  Hepatic Function Latest Ref Rng & Units 06/29/2021 06/28/2021 06/26/2021  Total Protein 6.5 - 8.1 g/dL 6.4(L) 6.3(L) 6.6  Albumin 3.5 - 5.0 g/dL 2.3(L) 2.4(L) 2.3(L)  AST 15 - 41 U/L 14(L) 13(L) 16  ALT 0 - 44 U/L '11 12 11  ' Alk Phosphatase 38 - 126 U/L 126 123 137(H)  Total Bilirubin 0.3 - 1.2 mg/dL 1.1 1.0 1.9(H)  Bilirubin, Direct 0.0 - 0.2 mg/dL - 0.3(H) -    If any question arise or pregnancy is identified during hospitalization, contact for bosentan and macitentan: [phone]; ambrisentan: [phone].    Bonnita Nasuti Pharm.D. CPP, BCPS Clinical Pharmacist [phone] 06/29/2021 4:46 PM

## 2021-06-30 ENCOUNTER — Other Ambulatory Visit: Payer: Self-pay

## 2021-06-30 ENCOUNTER — Encounter (HOSPITAL_COMMUNITY): Payer: Self-pay | Admitting: Family Medicine

## 2021-06-30 DIAGNOSIS — R6521 Severe sepsis with septic shock: Secondary | ICD-10-CM

## 2021-06-30 DIAGNOSIS — A419 Sepsis, unspecified organism: Secondary | ICD-10-CM

## 2021-06-30 LAB — COMPREHENSIVE METABOLIC PANEL
ALT: 9 U/L (ref 0–44)
AST: 11 U/L — ABNORMAL LOW (ref 15–41)
Albumin: 2 g/dL — ABNORMAL LOW (ref 3.5–5.0)
Alkaline Phosphatase: 99 U/L (ref 38–126)
Anion gap: 10 (ref 5–15)
BUN: 19 mg/dL (ref 8–23)
CO2: 34 mmol/L — ABNORMAL HIGH (ref 22–32)
Calcium: 8.1 mg/dL — ABNORMAL LOW (ref 8.9–10.3)
Chloride: 94 mmol/L — ABNORMAL LOW (ref 98–111)
Creatinine, Ser: 0.8 mg/dL (ref 0.44–1.00)
GFR, Estimated: >60 mL/min (ref >60)
Glucose, Bld: 175 mg/dL — ABNORMAL HIGH (ref 70–99)
Potassium: 3.9 mmol/L (ref 3.5–5.1)
Sodium: 138 mmol/L (ref 135–145)
Total Bilirubin: 0.9 mg/dL (ref 0.3–1.2)
Total Protein: 5.8 g/dL — ABNORMAL LOW (ref 6.5–8.1)

## 2021-06-30 LAB — HEPARIN LEVEL (UNFRACTIONATED)
Heparin Unfractionated: <0.1 IU/mL — ABNORMAL LOW (ref 0.30–0.70)
Heparin Unfractionated: <0.1 IU/mL — ABNORMAL LOW (ref 0.30–0.70)

## 2021-06-30 LAB — CBC
HCT: 24.5 % — ABNORMAL LOW (ref 36.0–46.0)
Hemoglobin: 8 g/dL — ABNORMAL LOW (ref 12.0–15.0)
MCH: 27.8 pg (ref 26.0–34.0)
MCHC: 32.7 g/dL (ref 30.0–36.0)
MCV: 85.1 fL (ref 80.0–100.0)
Platelets: 166 10*3/uL (ref 150–400)
RBC: 2.88 MIL/uL — ABNORMAL LOW (ref 3.87–5.11)
RDW: 14.4 % (ref 11.5–15.5)
WBC: 20.6 10*3/uL — ABNORMAL HIGH (ref 4.0–10.5)
nRBC: 0.2 % (ref 0.0–0.2)

## 2021-06-30 LAB — APTT: aPTT: 30 seconds (ref 24–36)

## 2021-06-30 LAB — GLUCOSE, CAPILLARY
Glucose-Capillary: 162 mg/dL — ABNORMAL HIGH (ref 70–99)
Glucose-Capillary: 187 mg/dL — ABNORMAL HIGH (ref 70–99)
Glucose-Capillary: 240 mg/dL — ABNORMAL HIGH (ref 70–99)
Glucose-Capillary: 172 mg/dL — ABNORMAL HIGH (ref 70–99)

## 2021-06-30 LAB — PROCALCITONIN: Procalcitonin: 0.61 ng/mL

## 2021-06-30 MED ORDER — CHOLESTYRAMINE LIGHT 4 G PO PACK
4.0000 g | PACK | Freq: Every day | ORAL | Status: DC | PRN
  Administered 2021-07-01: 4 g via ORAL
  Filled 2021-06-30 (×2): qty 1

## 2021-06-30 MED ORDER — AMIODARONE LOAD VIA INFUSION
150.0000 mg | Freq: Once | INTRAVENOUS | Status: AC
  Administered 2021-06-30: 150 mg via INTRAVENOUS
  Filled 2021-06-30: qty 83.34

## 2021-06-30 MED ORDER — TORSEMIDE 20 MG PO TABS
40.0000 mg | ORAL_TABLET | Freq: Two times a day (BID) | ORAL | Status: DC
  Administered 2021-06-30 – 2021-07-02 (×5): 40 mg via ORAL
  Filled 2021-06-30 (×7): qty 2

## 2021-06-30 MED ORDER — AMIODARONE HCL IN DEXTROSE 360-4.14 MG/200ML-% IV SOLN
30.0000 mg/h | INTRAVENOUS | Status: DC
  Administered 2021-06-30 – 2021-07-05 (×10): 30 mg/h via INTRAVENOUS
  Filled 2021-06-30 (×10): qty 200

## 2021-06-30 MED ORDER — AMIODARONE HCL IN DEXTROSE 360-4.14 MG/200ML-% IV SOLN
60.0000 mg/h | INTRAVENOUS | Status: AC
  Administered 2021-06-30 (×2): 60 mg/h via INTRAVENOUS
  Filled 2021-06-30: qty 400

## 2021-06-30 NOTE — Progress Notes (Signed)
PROGRESS NOTE    Debra Saunders  WJX:[id number] DOB: [birth year] DOA: 78/12/2021 PCP: [name], MD    No chief complaint on file.   Brief Narrative:  Patient is admitted to [facility] on 1/3 and found to have severe sepsis with septic shock secondary to multifocal pneumonia (?septic emboli) and strep bacteremia as well as klebsiella UTI. She required peripheral levophed and stress steroids for a short time. TEE on 1/6 consistent with infected pacemaker lead with vegetation. Also had acute anemia s/p prbc transfusion x1, . Eliquis was held for a few days. FOBT negative,  Seen by GI with no plans for scope.  She had AKI and shock liver has resolved, she is transferred to [facility] for pacer traction.   ID/ cards following   Subjective:  Had afib/rvr this am, started on amio She is on heparin drip, denies ab pain, no sign of bleeding She is sitting up in chair  on 4liter oxygen , reports this is her baseline , denies sob, no cough, no chest pain, No fever  Left lower extremity edema, reports chronic   Assessment & Plan:   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  Active Problems:   Hypertension   Diabetes ([place])   Pulmonary hypertension, unspecified ([place])   Hyperlipidemia   OSA (obstructive sleep apnea)   Acute on chronic respiratory failure with hypoxia (HCC)   Atrial fibrillation with tachy brady syndrome    Chronic diastolic CHF (congestive heart failure) ([place])   Acute renal failure superimposed on stage 3a chronic kidney disease ([place])   DVT (deep venous thrombosis) (HCC)   Acute blood loss anemia   klebsiella pneumoniae UTI   group G streptococcal bacteremia with large right ventricle pacing wire vegetation and septic embolization to the lungs bvs multifocal pneumonia/septic shock on presentation to [facility] -shock resolved, aki/shock liver resolved, procalcitonin down from 18.7 to 0.59 -currently on  rocephin -Plan to have pacer extraction, tentatively on 1/9, will keep n.p.o. after midnight -ID/cardiology following, input appreciated     A. fib/RVR, h/o tachy-brady syndrome s/p pacemaker placement -Remain in RVR despite Cardizem and digoxin, started on amiodarone drip 1/8 AM per cardiology recommendation -plan to extract pacer -Currently on heparin drip, transition back to Eliquis when okay with cardiology/EP -appreciate Dr Haroldine Laws  Pulmonary hypertension/ILD? On sildenafil/macitentan/selexipag these meds was held briefly due to shock/hypotension initially Dig held initially due to [facility], now resumed  Now restarted  Appreciate Dr Haroldine Laws  Chronic [facility] Diuretic held initially due to shock/hypotension/AKI Now resumed spironolactone and demadex Appreciated Dr Haroldine Laws   Acute on Chronic hypoxic respiratory failure on home O2 4 L Initially required bipap Currently on 4 L, back to baseline  AKI on CKD II Aki resolved, cr bact to baseline  Anemia of chronic disease S/p prbc x1 FOBT negative   H/o DVT, s/p IVC filter in 2015 Currently on heparin drip, plan to resume eliquis once ok with cards  Diet controlled DM2 Currently on ssi SGLT2 inhibitor prior to discharge per Dr Haroldine Laws  H/o subdural hematoma 2014  H/o chronic diarrhea , though due to pulmonary hypertension meds per GI -on cholestyramine -Reports has not had diarrhea since Christmas, change Cholestyramine to prn for diarrhea   Obesity /OSA on cpap  Body mass index is 38.66 kg/m.[name]     Skin Assessment:  I have examined the patients skin and I agree with the wound assessment as performed by the wound care RN  as outlined below:  Pressure Injury 06/29/21 Right Stage 1 -  Intact skin with non-blanchable redness of a localized area usually over a bony prominence. abrasion on right gluteal fold (Active)  06/29/21 1500  Location:   Location Orientation: Right  Staging: Stage 1 -  Intact skin with  non-blanchable redness of a localized area usually over a bony prominence.  Wound Description (Comments): abrasion on right gluteal fold  Present on Admission: Yes    Unresulted Labs (From admission, onward)     Start     Ordered   07/01/21 9528  Basic metabolic panel  Tomorrow morning,   R       Question:  Specimen collection method  Answer:  Lab=Lab collect   06/30/21 2217   07/01/21 0500  Magnesium  Tomorrow morning,   R       Question:  Specimen collection method  Answer:  Lab=Lab collect   06/30/21 2217   07/01/21 0000  Heparin level (unfractionated)  Once-Timed,   TIMED       Question:  Specimen collection method  Answer:  Lab=Lab collect   06/30/21 1541   06/30/21 0500  Procalcitonin  Daily,   R      06/29/21 1247   06/30/21 0500  CBC  Daily,   R      06/29/21 1624              DVT prophylaxis: Place TED hose Start: 06/29/21 1347   Code Status:full Family Communication: patient  Disposition:   Status is: Inpatient   Dispo: The patient is from: home, lives by herself, family stays with her during daytime               Anticipated d/c is to: home health, she declined snf placement              Anticipated d/c date is: TBD                Consultants:  Cardiology/EP ID  Procedures:  As above  Antimicrobials:   Anti-infectives (From admission, onward)    Start     Dose/Rate Route Frequency Ordered Stop   06/29/21 1330  cefTRIAXone (ROCEPHIN) 2 g in sodium chloride 0.9 % 100 mL IVPB       Note to Pharmacy: 24 hours from last dose at [facility]. Not sure if on 2g?   2 g 200 mL/hr over 30 Minutes Intravenous Every 24 hours 06/29/21 1237             Objective: Vitals:   06/30/21 1139 06/30/21 1525 06/30/21 1914 06/30/21 1937  BP: 132/78 136/71 135/65 (!) 145/63  Pulse: 99 92 87 (!) 105  Resp: 20 20 (!) 28 18  Temp: 98 F (36.7 C) 98 F (36.7 C) 98.5 F (36.9 C) (!) 97.4 F (36.3 C)  TempSrc: Oral Oral Oral Oral  SpO2: 94% 91% 95% 95%  Weight:       Height:        Intake/Output Summary (Last 24 hours) at 06/30/2021 2217 Last data filed at 06/30/2021 2214 Gross per 24 hour  Intake 840 ml  Output 950 ml  Net -110 ml   Filed Weights   06/29/21 1600 06/30/21 0621  Weight: 92.8 kg 89.8 kg    Examination:  General exam: alert, awake, communicative,calm, NAD Respiratory system: Clear to auscultation. Respiratory effort normal. Cardiovascular system:  IRRR. + systolic murmur right upper sternal border Gastrointestinal system: Abdomen is nondistended, soft and nontender.  Normal  bowel sounds heard. Central nervous system: Alert and oriented. No focal neurological deficits. Extremities:  left lower extremity edema, reports choric  Skin: No rashes, lesions or ulcers Psychiatry: Judgement and insight appear normal. Mood & affect appropriate.     Data Reviewed: I have personally reviewed following labs and imaging studies  CBC: Recent Labs  Lab 06/26/21 0847 06/28/21 0500 06/29/21 0507 06/29/21 1254 06/30/21 0522  WBC 14.3* 9.3 15.8* 18.3* 20.6*  NEUTROABS  --  7.3 11.9*  --   --   HGB 8.2* 7.7* 8.0* 8.9* 8.0*  HCT 25.8* 23.4* 24.4* 27.1* 24.5*  MCV 84.3 83.9 83.6 84.2 85.1  PLT 93* 118* 135* 155 185    Basic Metabolic Panel: Recent Labs  Lab 06/27/21 0828 06/27/21 1749 06/28/21 0500 06/29/21 1254 06/30/21 0522  NA 129* 131* 134* 140 138  K 4.5 4.5 3.8 3.9 3.9  CL 95* 97* 100 96* 94*  CO2 24 25 30  34* 34*  GLUCOSE 227* 257* 242* 226* 175*  BUN 90* 84* 76* 31* 19  CREATININE 2.05* 1.68* 1.46* 0.96 0.80  CALCIUM 7.2* 7.3* 7.5* 8.3* 8.1*    GFR: Estimated Creatinine Clearance: 58.8 mL/min (by C-G formula based on SCr of 0.8 mg/dL).  Liver Function Tests: Recent Labs  Lab 06/26/21 0847 06/28/21 0500 06/29/21 1254 06/29/21 1608 06/30/21 0522  AST 16 13* 14* 13* 11*  ALT 11 12 11 12 9   ALKPHOS 137* 123 126 122 99  BILITOT 1.9* 1.0 1.1 0.9 0.9  PROT 6.6 6.3* 6.4* 6.8 5.8*  ALBUMIN 2.3* 2.4* 2.3* 2.3*  2.0*    CBG: Recent Labs  Lab 06/29/21 2102 06/30/21 0617 06/30/21 1134 06/30/21 1626 06/30/21 2115  GLUCAP 204* 162* 187* 240* 172*     Recent Results (from the past 240 hour(s))  Blood culture (routine x 2)     Status: Abnormal   Collection Time: 06/25/21  2:33 AM   Specimen: BLOOD RIGHT FOREARM  Result Value Ref Range Status   Specimen Description   Final    BLOOD RIGHT FOREARM Performed at [facility], [address], [place], [place] [address]    Special Requests   Final    BOTTLES DRAWN AEROBIC AND ANAEROBIC Blood Culture results may not be optimal due to an inadequate volume of blood received in culture bottles Performed at [facility], [address] [place], [place] [address]    Culture  Setup Time   Final    GRAM POSITIVE COCCI IN BOTH AEROBIC AND ANAEROBIC BOTTLES Organism ID to follow CRITICAL RESULT CALLED TO, READ BACK BY AND VERIFIED WITHArdeen Garland PHARMD [id number] 06/25/21 HNM Performed at [facility], [address] [place], [place] [address]    Culture STREPTOCOCCUS GROUP G (A)  Final   Report Status 06/28/2021 FINAL  Final   Organism ID, Bacteria STREPTOCOCCUS GROUP G  Final      Susceptibility   Streptococcus group g - MIC*    CLINDAMYCIN RESISTANT Resistant     AMPICILLIN <=0.25 SENSITIVE Sensitive     ERYTHROMYCIN 2 RESISTANT Resistant     VANCOMYCIN 0.5 SENSITIVE Sensitive     CEFTRIAXONE <=0.12 SENSITIVE Sensitive     LEVOFLOXACIN 0.5 SENSITIVE Sensitive     PENICILLIN Value in next row Sensitive      SENSITIVE0.06    * STREPTOCOCCUS GROUP G  Blood Culture ID Panel (Reflexed)     Status: Abnormal   Collection Time: 06/25/21  2:33 AM  Result Value Ref Range  Status   Enterococcus faecalis NOT DETECTED NOT DETECTED Final   Enterococcus Faecium NOT DETECTED NOT DETECTED Final   Listeria monocytogenes NOT DETECTED NOT DETECTED Final   Staphylococcus species NOT DETECTED NOT DETECTED Final   Staphylococcus  aureus (BCID) NOT DETECTED NOT DETECTED Final   Staphylococcus epidermidis NOT DETECTED NOT DETECTED Final   Staphylococcus lugdunensis NOT DETECTED NOT DETECTED Final   Streptococcus species DETECTED (A) NOT DETECTED Final    Comment: Not Enterococcus species, Streptococcus agalactiae, Streptococcus pyogenes, or Streptococcus pneumoniae. CRITICAL RESULT CALLED TO, READ BACK BY AND VERIFIED WITH: [place] [id number] [id number] 06/25/21 HNM    Streptococcus agalactiae NOT DETECTED NOT DETECTED Final   Streptococcus pneumoniae NOT DETECTED NOT DETECTED Final   Streptococcus pyogenes NOT DETECTED NOT DETECTED Final   A.calcoaceticus-baumannii NOT DETECTED NOT DETECTED Final   Bacteroides fragilis NOT DETECTED NOT DETECTED Final   Enterobacterales NOT DETECTED NOT DETECTED Final   Enterobacter cloacae complex NOT DETECTED NOT DETECTED Final   Escherichia coli NOT DETECTED NOT DETECTED Final   Klebsiella aerogenes NOT DETECTED NOT DETECTED Final   Klebsiella oxytoca NOT DETECTED NOT DETECTED Final   Klebsiella pneumoniae NOT DETECTED NOT DETECTED Final   Proteus species NOT DETECTED NOT DETECTED Final   Salmonella species NOT DETECTED NOT DETECTED Final   Serratia marcescens NOT DETECTED NOT DETECTED Final   Haemophilus influenzae NOT DETECTED NOT DETECTED Final   Neisseria meningitidis NOT DETECTED NOT DETECTED Final   Pseudomonas aeruginosa NOT DETECTED NOT DETECTED Final   Stenotrophomonas maltophilia NOT DETECTED NOT DETECTED Final   Candida albicans NOT DETECTED NOT DETECTED Final   Candida auris NOT DETECTED NOT DETECTED Final   Candida glabrata NOT DETECTED NOT DETECTED Final   Candida krusei NOT DETECTED NOT DETECTED Final   Candida parapsilosis NOT DETECTED NOT DETECTED Final   Candida tropicalis NOT DETECTED NOT DETECTED Final   Cryptococcus neoformans/gattii NOT DETECTED NOT DETECTED Final    Comment: Performed at [facility], [address] [place], [place] [address]   Resp Panel by RT-PCR (Flu A&B, Covid) Nasopharyngeal Swab     Status: None   Collection Time: 06/25/21  3:33 AM   Specimen: Nasopharyngeal Swab; Nasopharyngeal(NP) swabs in vial transport medium  Result Value Ref Range Status   SARS Coronavirus 2 by RT PCR NEGATIVE NEGATIVE Final    Comment: (NOTE) SARS-CoV-2 target nucleic acids are NOT DETECTED.  The SARS-CoV-2 RNA is generally detectable in upper respiratory specimens during the acute phase of infection. The lowest concentration of SARS-CoV-2 viral copies this assay can detect is 138 copies/mL. A negative result does not preclude SARS-Cov-2 infection and should not be used as the sole basis for treatment or other patient management decisions. A negative result may occur with  improper specimen collection/handling, submission of specimen other than nasopharyngeal swab, presence of viral mutation(s) within the areas targeted by this assay, and inadequate number of viral copies(<138 copies/mL). A negative result must be combined with clinical observations, patient history, and epidemiological information. The expected result is Negative.  Fact Sheet for Patients:  EntrepreneurPulse.com.au  Fact Sheet for Healthcare Providers:  IncredibleEmployment.be  This test is no t yet approved or cleared by the [place] FDA and  has been authorized for detection and/or diagnosis of SARS-CoV-2 by FDA under an Emergency Use Authorization (EUA). This EUA will remain  in effect (meaning this test can be used) for the duration of the COVID-19 declaration under Section 564(b)(1) of the Act, 21 U.S.C.section 360bbb-3(b)(1), unless the  authorization is terminated  or revoked sooner.       Influenza A by PCR NEGATIVE NEGATIVE Final   Influenza B by PCR NEGATIVE NEGATIVE Final    Comment: (NOTE) The Xpert Xpress SARS-CoV-2/FLU/RSV plus assay is intended as an aid in the diagnosis of influenza from  Nasopharyngeal swab specimens and should not be used as a sole basis for treatment. Nasal washings and aspirates are unacceptable for Xpert Xpress SARS-CoV-2/FLU/RSV testing.  Fact Sheet for Patients: EntrepreneurPulse.com.au  Fact Sheet for Healthcare Providers: IncredibleEmployment.be  This test is not yet approved or cleared by the [place] FDA and has been authorized for detection and/or diagnosis of SARS-CoV-2 by FDA under an Emergency Use Authorization (EUA). This EUA will remain in effect (meaning this test can be used) for the duration of the COVID-19 declaration under Section 564(b)(1) of the Act, 21 U.S.C. section 360bbb-3(b)(1), unless the authorization is terminated or revoked.  Performed at [facility], [address] [place], [place] [address]   Blood culture (routine x 2)     Status: Abnormal   Collection Time: 06/25/21  5:55 AM   Specimen: BLOOD  Result Value Ref Range Status   Specimen Description   Final    BLOOD LEFT ARM Performed at [facility], [address] [place], [place] [address]    Special Requests   Final    BOTTLES DRAWN AEROBIC AND ANAEROBIC Blood Culture adequate volume Performed at [facility], [address] [place], [place] [address]    Culture  Setup Time   Final    GRAM POSITIVE COCCI IN BOTH AEROBIC AND ANAEROBIC BOTTLES CRITICAL VALUE NOTED.  VALUE IS CONSISTENT WITH PREVIOUSLY REPORTED AND CALLED VALUE. Performed at [facility], [address] [place], [place] [address]    Culture (A)  Final    STREPTOCOCCUS GROUP G SUSCEPTIBILITIES PERFORMED ON PREVIOUS CULTURE WITHIN THE LAST 5 DAYS. Performed at [facility], [place] [address], [place], [place] [address]    Report Status 06/28/2021 FINAL  Final  Urine Culture     Status: Abnormal   Collection Time: 06/25/21  6:55 AM   Specimen: Urine, Clean Catch  Result Value Ref Range Status    Specimen Description   Final    URINE, CLEAN CATCH Performed at [facility], [address] [place], [place] [address]    Special Requests   Final    NONE Performed at [facility], [place], [place] [address]    Culture >=100,000 COLONIES/mL KLEBSIELLA PNEUMONIAE (A)  Final   Report Status 06/27/2021 FINAL  Final   Organism ID, Bacteria KLEBSIELLA PNEUMONIAE (A)  Final      Susceptibility   Klebsiella pneumoniae - MIC*    AMPICILLIN RESISTANT Resistant     CEFAZOLIN <=4 SENSITIVE Sensitive     CEFEPIME <=0.12 SENSITIVE Sensitive     CEFTRIAXONE <=0.25 SENSITIVE Sensitive     CIPROFLOXACIN <=0.25 SENSITIVE Sensitive     GENTAMICIN <=1 SENSITIVE Sensitive     IMIPENEM <=0.25 SENSITIVE Sensitive     NITROFURANTOIN 32 SENSITIVE Sensitive     TRIMETH/SULFA <=20 SENSITIVE Sensitive     AMPICILLIN/SULBACTAM <=2 SENSITIVE Sensitive     PIP/TAZO <=4 SENSITIVE Sensitive     * >=100,000 COLONIES/mL KLEBSIELLA PNEUMONIAE  C Difficile Quick Screen w PCR reflex     Status: None   Collection Time: 06/25/21  6:06 PM   Specimen: Stool  Result Value Ref Range Status  C Diff antigen NEGATIVE NEGATIVE Final   C Diff toxin NEGATIVE NEGATIVE Final   C Diff interpretation No C. difficile detected.  Final    Comment: Performed at [facility], [address] [place], [place] [address]  MRSA Next Gen by PCR, Nasal     Status: None   Collection Time: 06/25/21 10:55 PM   Specimen: Nasal Mucosa; Nasal Swab  Result Value Ref Range Status   MRSA by PCR Next Gen NOT DETECTED NOT DETECTED Final    Comment: (NOTE) [name] (FDA approved for NASAL specimens only), is one component of a comprehensive MRSA colonization surveillance program. It is not intended to diagnose MRSA infection nor to guide or monitor treatment for MRSA infections. Test performance is not FDA approved in patients less than 16 years old. Performed at [facility], [address] [place], [place] [address]   Culture, blood (Routine X 2) w Reflex to ID Panel     Status: None (Preliminary result)   Collection Time: 06/27/21  8:29 AM   Specimen: BLOOD RIGHT HAND  Result Value Ref Range Status   Specimen Description BLOOD RIGHT HAND  Final   Special Requests   Final    BOTTLES DRAWN AEROBIC AND ANAEROBIC Blood Culture adequate volume   Culture   Final    NO GROWTH 3 DAYS Performed at [facility], [address] [place], [place] [address]    Report Status PENDING  Incomplete  Culture, blood (Routine X 2) w Reflex to ID Panel     Status: None (Preliminary result)   Collection Time: 06/27/21  8:29 AM   Specimen: BLOOD RIGHT HAND  Result Value Ref Range Status   Specimen Description BLOOD RIGHT HAND  Final   Special Requests   Final    BOTTLES DRAWN AEROBIC ONLY Blood Culture adequate volume   Culture   Final    NO GROWTH 3 DAYS Performed at [facility], [address] [place], [place] [address]    Report Status PENDING  Incomplete         Radiology Studies: No results found.      Scheduled Meds:  Difluprednate  1 drop Both Eyes BID   digoxin  0.125 mg Oral Daily   diltiazem  30 mg Oral Q6H   DULoxetine  60 mg Oral Daily   gabapentin  600 mg Oral TID   insulin aspart  0-9 Units Subcutaneous TID WC   macitentan  10 mg Oral Daily   pantoprazole (PROTONIX) IV  40 mg Intravenous Q24H   rosuvastatin  10 mg Oral Daily   Selexipag  800 mcg Oral BID   sildenafil  20 mg Oral TID   sodium chloride flush  3 mL Intravenous Q12H   spironolactone  25 mg Oral Daily   torsemide  40 mg Oral BID   Continuous Infusions:  sodium chloride     amiodarone 30 mg/hr (06/30/21 1829)   cefTRIAXone (ROCEPHIN)  IV 2 g (06/30/21 1250)   heparin 1,300 Units/hr (06/30/21 2212)     LOS: 1 day   Time spent: 58mins Greater than 50% of this time was spent in counseling, explanation of diagnosis, planning of further  management, and coordination of care.   Voice Recognition Viviann Spare dictation system was used to create this note, attempts have been made to correct errors. Please contact the author with questions and/or clarifications.   Florencia Reasons, MD PhD FACP [facility]  Available via Epic  secure chat 7am-7pm for nonurgent issues Please page for urgent issues To page the attending provider between 7A-7P or the covering provider during after hours 7P-7A, please log into the web site [email or web address] and access using universal [place] password for that web site. If you do not have the password, please call the hospital operator.    06/30/2021, 10:17 PM

## 2021-06-30 NOTE — Evaluation (Signed)
Clinical/Bedside Swallow Evaluation Patient Details  Name: Debra Saunders MRN: [id number] Date of Birth: [birth year]  Today's Date: 06/30/2021 Time: SLP Start Time (ACUTE ONLY): 1140 SLP Stop Time (ACUTE ONLY): 1200 SLP Time Calculation (min) (ACUTE ONLY): 20 min  Past Medical History:  Past Medical History:  Diagnosis Date   Arthritis    Atrial fibrillation, persistent ([place])    a. s/p TEE-guided DCCV on 08/20/2017   Cardiorenal syndrome    a. 09/2017 Creat rose to 2.7 w/ diuresis-->HD x 2.   Chest pain    a. 01/2012 Cath: nonobs dzs;  c. 04/2014 MV: No ischemia.   Chronic back pain    Chronic combined systolic (congestive) and diastolic (congestive) heart failure ([place])    a. Dx 2005 @ [facility];  b. 02/2014 Echo: EF 55-60%; c. 07/2017: EF 30-35%, diffuse HK, trivial AI, severe MR, moderate TR; d. 09/2017 Echo: EF 40-45%; e. 02/2018 Echo: EF 55-60%, Gr1 DD; f. 08/2019 Echo: EF 60-65%, Gr2 DD, Nl RV fxn, RVSP 49.97mHg. Mod-Sev MR. Mild to mod TR. Mod PR. RA ~ 855mg.   Chronic respiratory failure (HCC)    a. on home O2   Gastritis    History of intracranial hemorrhage    a. 6/14 described by neurosurgery as "small frontal posttraumatic SAH " - pt was on xarelto @ the time.   Hyperlipidemia    Hypertension    Interstitial lung disease (HCC)    Lipoma of back    Lymphedema    a. Chronic LLE edema.   NICM (nonischemic cardiomyopathy) ([place]   a. 09/2017 Echo: EF 40-45%; b. 09/2017 Cath: minor, insignificant CAD; c. 02/2018 Echo: EF 55-60%, Gr1 DD; d. 08/2019 Echo: EF 60-65%, Gr2 DD.   Obesity    PAH (pulmonary artery hypertension) ([place]   a. 09/2017 Echo: PASP 6582m; b. 09/2017 RHC: RA 23, RV 84/13, PA 84/29, PCWP 20, CO 3.89, CI 1.89. LVEDP 18.   Presence of permanent cardiac pacemaker 01/09/2014   Sepsis with metabolic encephalopathy ([place]  Sleep apnea    Streptococcal bacteremia    [facility]   Subarachnoid hemorrhage ([place]/30/2014   Symptomatic bradycardia    a. s/p BSX PPM.    Thyroid nodule    Type II diabetes mellitus ([place]  Urinary incontinence    Past Surgical History:  Past Surgical History:  Procedure Laterality Date   ABDOMINAL HYSTERECTOMY  1969   BREAST BIOPSY Right 10/28/2019   lymph node bx, [place], pending path    CARDIAC CATHETERIZATION  2013   [place] [facility] [place]: Only 20% ostial left circumflex.   CARDIOVERSION N/A 10/12/2017   Procedure: CARDIOVERSION;  Surgeon: GolMinna MerrittsD;  Location: [facility] ORS;  Service: Cardiovascular;  Laterality: N/A;   CATARACT EXTRACTION W/PHACO Right 09/19/2020   Procedure: CATARACT EXTRACTION PHACO AND INTRAOCULAR LENS PLACEMENT (IOC) RIGHT DIABETIC 3.95 00:43.0 9.2%;  Surgeon: BraLeandrew KoyanagiD;  Location: [facility]: Ophthalmology;  Laterality: Right;  Pt requests to be last sleep apnea   CATARACT EXTRACTION W/PHACO Left 10/03/2020   Procedure: CATARACT EXTRACTION PHACO AND INTRAOCULAR LENS PLACEMENT (IOCCastaliaEFT DIABETIC;  Surgeon: BraLeandrew KoyanagiD;  Location: ME[facility]ispService: Ophthalmology;  Laterality: Left;  3.25 0:59.3 5.5%   CHOLECYSTECTOMY     COLONOSCOPY WITH PROPOFOL N/A 09/10/2018   Procedure: COLONOSCOPY WITH PROPOFOL;  Surgeon: EllManya SilvasD;  [facility];  Service: Endoscopy;  Laterality: N/A;   CYST  REMOVAL TRUNK  2002   BACK   DIALYSIS/PERMA CATHETER INSERTION N/A 10/06/2017   Procedure: DIALYSIS/PERMA CATHETER INSERTION;  Surgeon: Katha Cabal, MD;  Location: [place] CV LAB;  Service: Cardiovascular;  Laterality: N/A;   INSERT / REPLACE / REMOVE PACEMAKER     lipoma removal  2014   back   PACEMAKER INSERTION  2015   [employer] dual chamber pacemaker implanted by Dr Caryl Comes for symptomatic bradycardia   PERMANENT PACEMAKER INSERTION N/A 01/09/2014   Procedure: PERMANENT PACEMAKER INSERTION;  Surgeon: Deboraha Sprang, MD;  Location: [facility] CATH LAB;  Service: Cardiovascular;  Laterality: N/A;   RIGHT HEART  CATH N/A 04/19/2018   Procedure: RIGHT HEART CATH;  Surgeon: Larey Dresser, MD;  Location: [place] CV LAB;  Service: Cardiovascular;  Laterality: N/A;   RIGHT HEART CATH N/A 08/30/2020   Procedure: RIGHT HEART CATH;  Surgeon: Larey Dresser, MD;  Location: [place] CV LAB;  Service: Cardiovascular;  Laterality: N/A;   RIGHT/LEFT HEART CATH AND CORONARY ANGIOGRAPHY N/A 10/19/2017   Procedure: RIGHT/LEFT HEART CATH AND CORONARY ANGIOGRAPHY;  Surgeon: Wellington Hampshire, MD;  Location: [place] CV LAB;  Service: Cardiovascular;  Laterality: N/A;   TEE WITHOUT CARDIOVERSION N/A 08/20/2017   Procedure: TRANSESOPHAGEAL ECHOCARDIOGRAM (TEE) & Direct current cardioversion;  Surgeon: Minna Merritts, MD;  Location: [facility] ORS;  Service: Cardiovascular;  Laterality: N/A;   TEE WITHOUT CARDIOVERSION N/A 06/28/2021   Procedure: TRANSESOPHAGEAL ECHOCARDIOGRAM (TEE);  Surgeon: Minna Merritts, MD;  Location: [facility] ORS;  Service: Cardiovascular;  Laterality: N/A;   TRIGGER FINGER RELEASE     HPI:  Pt is a 76 y.o. admitted 06/25/21 with c/o SOB, fever, chills, abdominal pain. Workup for septic shock secondary to klebsiella UTI and strep bacteremia with GIB, multifocal PNA, possible septic lung emboli. 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 Transfer to [facility] 06/29/21. Potential for PPM extraction 1/9. PMH includes HTN, afib, pacemaker, PAH, chronic resp failure (on home O2 at 4L/min), CHF, cardiorenal syndrome, DM2, OSA, stroke, ICH, chronic back pain, arthritis.    Assessment / Plan / Recommendation  Clinical Impression  [name] was seen during lunch meal for swallow evaluation. RN present and denies any difficulty, stating, "she can eat anything!" Pt denies dysphagia, but reports globus sensation on R side that she feels started over the past week and is not related to PO intake (specifically, food/liquid does not make the sensation any  worse or better). Oral mech exam revealed mild redness of faucial pillars and posterior pharyngeal wall, likely r/t extended use of supplemental oxygen via [place]. She was seen with regular solid of cooked cabbage, baked chicken, and mac & cheese. She had no s/s aspiration or dysphagia. She reports the globus sensation on R started during this hospitalization over the past week. Should this sensation persist, recommend ENT evaluation as outpatient.  Pt in agreement with plan. Continue with thin liquids and regular consistency solids. No further ST indicated.  SLP Visit Diagnosis: Dysphagia, unspecified (R13.10)    Aspiration Risk  Mild aspiration risk    Diet Recommendation Thin liquid;Regular        Other  Recommendations Oral Care Recommendations: Oral care BID    Recommendations for follow up therapy are one component of a multi-disciplinary discharge planning process, led by the attending physician.  Recommendations may be updated based on patient status, additional functional criteria and insurance authorization.  Follow up Recommendations No SLP follow  up      Assistance Recommended at Discharge None  Functional Status Assessment  None  Frequency and Duration   None         Prognosis Prognosis for Safe Diet Advancement: Good      Swallow Study   General Date of Onset: 06/30/21 HPI: Pt is a 76 y.o. admitted 06/25/21 with c/o SOB, fever, chills, abdominal pain. Workup for septic shock secondary to klebsiella UTI and strep bacteremia with GIB, multifocal PNA, possible septic lung emboli. 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 Transfer to [facility] 06/29/21. Potential for PPM extraction 1/9. PMH includes HTN, afib, pacemaker, PAH, chronic resp failure (on home O2 at 4L/min), CHF, cardiorenal syndrome, DM2, OSA, stroke, ICH, chronic back pain, arthritis. Type of Study: Bedside Swallow Evaluation Previous Swallow Assessment:  none Diet Prior to this Study: Regular;Thin liquids Temperature Spikes Noted: No Respiratory Status: Nasal cannula History of Recent Intubation: No Behavior/Cognition: Alert;Cooperative;Pleasant mood Oral Cavity Assessment: Within Functional Limits Oral Care Completed by SLP: Recent completion by staff Oral Cavity - Dentition: Adequate natural dentition Vision: Functional for self-feeding Self-Feeding Abilities: Able to feed self Patient Positioning: Upright in chair Baseline Vocal Quality: Normal Volitional Cough: Strong Volitional Swallow: Able to elicit    Oral/Motor/Sensory Function Overall Oral Motor/Sensory Function: Within functional limits   Ice Chips Ice chips: Not tested   Thin Liquid Thin Liquid: Within functional limits Presentation: Cup;Straw    Nectar Thick Nectar Thick Liquid: Not tested   Honey Thick Honey Thick Liquid: Not tested   Puree Puree: Within functional limits Presentation: Self Fed   Solid     Solid: Within functional limits Presentation: [place] P. Orland Visconti, M.S., CCC-SLP Speech-Language Pathologist Acute Rehabilitation Services Pager: [place] 06/30/2021,12:34 PM

## 2021-06-30 NOTE — Progress Notes (Signed)
ANTICOAGULATION CONSULT NOTE - Follow Up Consult  Pharmacy Consult for Heparin Indication: atrial fibrillation  Allergies  Allergen Reactions   Aspirin Hives and Other (See Comments)    Pt states that she is unable to take because she had bleeding in her brain   Other Other (See Comments)    Pt states that she is unable to take blood thinners because she had bleeding in her brain; Blood Thinners-per doctor at [facility] (Xarelto)   Penicillins Hives, Shortness Of Breath, Swelling and Other (See Comments)    Has patient had a PCN reaction causing immediate rash, facial/tongue/throat swelling, SOB or lightheadedness with hypotension: Yes Has patient had a PCN reaction causing severe rash involving mucus membranes or skin necrosis: No Has patient had a PCN reaction that required hospitalization No Has patient had a PCN reaction occurring within the last 10 years: No If all of the above answers are "NO", then may proceed with Cephalosporin use.    Patient Measurements: Height: 5' (152.4 cm) Weight: 89.8 kg (197 lb 15.6 oz) IBW/kg (Calculated) : 45.5   Vital Signs: Temp: 98.6 F (37 C) (01/08 0403) Temp Source: Oral (01/08 0403) BP: 146/81 (01/08 0403) Pulse Rate: 95 (01/08 0403)  Labs: Recent Labs    06/28/21 0500 06/29/21 0507 06/29/21 1254 06/30/21 0522  HGB 7.7* 8.0* 8.9* 8.0*  HCT 23.4* 24.4* 27.1* 24.5*  PLT 118* 135* 155 166  APTT  --   --   --  30  HEPARINUNFRC  --   --   --  <0.10*  CREATININE 1.46*  --  0.96 0.80     Estimated Creatinine Clearance: 58.8 mL/min (by C-G formula based on SCr of 0.8 mg/dL).    Assessment: 75yof admitted [facility] with sepsis found to have vegetation on pace maker lead and transferred to [facility] for explant.  Hx PAF last dose apixaban 1/3 PTA - admit hgb fel 7 and pt with black tarry stool.  GI work up conservative management at that time with other illness.   Currently on low dose heparin infusion. Initial heparin level is  undetectable. Per RN, no issues with bleeding or bloody stools noted.   Hgb down to 8. Plt improved.   Goal of Therapy:  Heparin level 0.3-0.7 units/ml Monitor platelets by anticoagulation protocol: Yes   Plan:  Increase heparin to 1000 units/hr. No bolus  F/u 8 hr HL  Monitor s/s bleeding    Albertina Parr, PharmD., BCPS, BCCCP Clinical Pharmacist Please refer to [facility] for unit-specific pharmacist

## 2021-06-30 NOTE — Progress Notes (Signed)
ANTICOAGULATION CONSULT NOTE - Follow Up Consult  Pharmacy Consult for Heparin Indication: atrial fibrillation  Allergies  Allergen Reactions   Aspirin Hives and Other (See Comments)    Pt states that she is unable to take because she had bleeding in her brain   Other Other (See Comments)    Pt states that she is unable to take blood thinners because she had bleeding in her brain; Blood Thinners-per doctor at [facility] (Xarelto)   Penicillins Hives, Shortness Of Breath, Swelling and Other (See Comments)    Has patient had a PCN reaction causing immediate rash, facial/tongue/throat swelling, SOB or lightheadedness with hypotension: Yes Has patient had a PCN reaction causing severe rash involving mucus membranes or skin necrosis: No Has patient had a PCN reaction that required hospitalization No Has patient had a PCN reaction occurring within the last 10 years: No If all of the above answers are "NO", then may proceed with Cephalosporin use.    Patient Measurements: Height: 5' (152.4 cm) Weight: 89.8 kg (197 lb 15.6 oz) IBW/kg (Calculated) : 45.5   Vital Signs: Temp: 98 F (36.7 C) (01/08 1139) Temp Source: Oral (01/08 1139) BP: 132/78 (01/08 1139) Pulse Rate: 99 (01/08 1139)  Labs: Recent Labs    06/28/21 0500 06/29/21 0507 06/29/21 1254 06/30/21 0522 06/30/21 1426  HGB 7.7* 8.0* 8.9* 8.0*  --   HCT 23.4* 24.4* 27.1* 24.5*  --   PLT 118* 135* 155 166  --   APTT  --   --   --  30  --   HEPARINUNFRC  --   --   --  <0.10* <0.10*  CREATININE 1.46*  --  0.96 0.80  --      Estimated Creatinine Clearance: 58.8 mL/min (by C-G formula based on SCr of 0.8 mg/dL).    Assessment: 75yof admitted to [facility] with sepsis found to have vegetation on pace maker lead and transferred to [facility] for explant.  Hx PAF last dose apixaban 1/3 PTA - admit hgb fel 7 and pt with black tarry stool.  GI work up conservative management at that time with other illness.   Currently on low dose  heparin infusion. Follow up  heparin level is still undetectable after rate change. No issues with bleeding or bloody stools noted.   Hgb down to 8. Plt improved.   Goal of Therapy:  Heparin level 0.3-0.7 units/ml Monitor platelets by anticoagulation protocol: Yes   Plan:  Increase heparin to 1300 units/hr. No bolus  F/u 8 hr HL  Monitor s/s bleeding   Erin Hearing PharmD., BCPS Clinical Pharmacist 06/30/2021 3:18 PM

## 2021-06-30 NOTE — Evaluation (Signed)
Physical Therapy Evaluation Patient Details Name: Debra Saunders MRN: [id number] DOB: 11-21-1945 Today's Date: 06/30/2021  History of Present Illness  Pt is a 75 y.o. admitted 06/25/21 with c/o SOB, fever, chills, abdominal pain. Workup for septic shock secondary to klebsiella UTI and strep bacteremia with GIB, multifocal PNA, possible septic lung emboli. 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 Transfer to [facility] 06/29/21. Potential for PPM extraction 1/9. PMH includes HTN, afib, pacemaker, PAH, chronic resp failure (on home O2), CHF, cardiorenal syndrome, DM2, OSA, stroke, ICH, chronic back pain, arthritis.   Clinical Impression  Pt presents with an overall decrease in functional mobility secondary to above. PTA, pt mod indep with rollator, lives alone with intermittent assist from family for household tasks. Today, pt able to initiate transfer and gait training with RW and minA; pt requiring increased assist for ADLs. Pt would benefit from continued acute PT services to maximize functional mobility and independence prior to d/c with HHPT services; pt adamantly against rehab at SNF, reports she will d/c to her home and daughter/family can assist as needed.     SpO2 stable on 4L O2; DOE 3-4/4 HR 98-147    Recommendations for follow up therapy are one component of a multi-disciplinary discharge planning process, led by the attending physician.  Recommendations may be updated based on patient status, additional functional criteria and insurance authorization.  Follow Up Recommendations Home health PT (adamant against SNF)    Assistance Recommended at Discharge Frequent or constant Supervision/Assistance  Patient can return home with the following  A little help with walking and/or transfers;A little help with bathing/dressing/bathroom;Assistance with cooking/housework;Assist for transportation;Help with stairs or ramp for entrance     Equipment Recommendations  (TBD)  Recommendations for Other Services       Functional Status Assessment Patient has had a recent decline in their functional status and demonstrates the ability to make significant improvements in function in a reasonable and predictable amount of time.     Precautions / Restrictions Precautions Precautions: Fall;Other (comment) Precaution Comments: bladder/bowel incontinence, watch HR, wears 4L O2 baseline Restrictions Weight Bearing Restrictions: No      Mobility  Bed Mobility Overal bed mobility: Needs Assistance Bed Mobility: Supine to Sit     Supine to sit: Supervision;HOB elevated     General bed mobility comments: increased time and effort, cues for breathing and activity pacing once seated EOB, no physical assist required    Transfers Overall transfer level: Needs assistance Equipment used: Rolling walker (2 wheels) Transfers: Sit to/from Stand Sit to Stand: Min assist           General transfer comment: Able to stand from EOB and BSC to RW with minA for trunk elevation and stability; cues for hand placement    Ambulation/Gait Ambulation/Gait assistance: Min assist Gait Distance (Feet): 6 Feet Assistive device: Rolling walker (2 wheels) Gait Pattern/deviations: Step-to pattern;Trunk flexed;Wide base of support Gait velocity: Decreased     General Gait Details: Steps to [facility] then to recliner with RW and minA for stability; DOE 3-4/4 noted requiring seated rest, HR up to 147  Stairs            Wheelchair Mobility    Modified Rankin (Stroke Patients Only)       Balance Overall balance assessment: Needs assistance   Sitting balance-Leahy Scale: Fair     Standing balance support: Bilateral upper extremity supported;During functional activity Standing balance-Leahy Scale: Poor Standing balance comment: Requiring  assist for perianal hygiene while standing                             Pertinent  Vitals/Pain Pain Assessment: Faces Faces Pain Scale: Hurts a little bit Pain Location: perianal area with hygiene Pain Descriptors / Indicators: Discomfort Pain Intervention(s): Monitored during session    Home Living Family/patient expects to be discharged to:: Private residence Living Arrangements: Alone Available Help at Discharge: Family;Available PRN/intermittently Type of Home: House Home Access: Ramped entrance       Home Layout: One level Home Equipment: Rollator (4 wheels);Tub bench;BSC/3in1 Additional Comments: Daughter lives 10-min away, able to assist PRN; pt's grandson works from her home during week, also able to assist PRN    Prior Function Prior Level of Function : Independent/Modified Independent;Needs assist             Mobility Comments: Typically mod indep with rollator. Wears 4L O2 baseline ADLs Comments: Typically mod indep with ADLs, bathes seated on tub bench. On weeks leading to admission, daughter needing to assist more around house, especially with cleaning from frequent bowel incontinence (pt reports she hasn't been eating much since Thanksgiving due to incontinence "any time I eat"). Pt reports indep with med management     Hand Dominance        Extremity/Trunk Assessment   Upper Extremity Assessment Upper Extremity Assessment: Generalized weakness    Lower Extremity Assessment Lower Extremity Assessment: Generalized weakness       Communication   Communication: No difficulties  Cognition Arousal/Alertness: Awake/alert Behavior During Therapy: WFL for tasks assessed/performed Overall Cognitive Status: Within Functional Limits for tasks assessed                                          General Comments General comments (skin integrity, edema, etc.): DOE 3-4/4 noted, SpO2 >/91% on 4L O2 (when pleth reliable); HR up to 147    Exercises     Assessment/Plan    PT Assessment Patient needs continued PT services  PT  Problem List Decreased strength;Decreased activity tolerance;Decreased balance;Decreased mobility;Cardiopulmonary status limiting activity;Decreased skin integrity       PT Treatment Interventions DME instruction;Gait training;Functional mobility training;Therapeutic activities;Therapeutic exercise;Balance training;Patient/family education    PT Goals (Current goals can be found in the Care Plan section)  Acute Rehab PT Goals Patient Stated Goal: "I'll be going back to my own home" PT Goal Formulation: With patient Time For Goal Achievement: 07/14/21 Potential to Achieve Goals: Good    Frequency Min 3X/week     Co-evaluation               AM-PAC PT "6 Clicks" Mobility  Outcome Measure Help needed turning from your back to your side while in a flat bed without using bedrails?: A Little Help needed moving from lying on your back to sitting on the side of a flat bed without using bedrails?: A Little Help needed moving to and from a bed to a chair (including a wheelchair)?: A Little Help needed standing up from a chair using your arms (e.g., wheelchair or bedside chair)?: A Little Help needed to walk in hospital room?: Total Help needed climbing 3-5 steps with a railing? : Total 6 Click Score: 14    End of Session Equipment Utilized During Treatment: Oxygen Activity Tolerance: Patient tolerated treatment well Patient left:  in chair;with call bell/phone within reach Nurse Communication: Mobility status PT Visit Diagnosis: Other abnormalities of gait and mobility (R26.89);Muscle weakness (generalized) (M62.81)    Time: 5247-9980 PT Time Calculation (min) (ACUTE ONLY): 24 min   Charges:   PT Evaluation $PT Eval Moderate Complexity: 1 Mod PT Treatments $Therapeutic Activity: 8-22 mins   Mabeline Caras, PT, DPT Acute Rehabilitation Services  Pager [phone] Office [phone]  Derry Lory 06/30/2021, 9:06 AM

## 2021-06-30 NOTE — Progress Notes (Addendum)
Advanced Heart Failure Rounding Note   Subjective:    Remains in AF with RVR rates consistently 110-120s despite po cardizem. BP high.   Denies SOB, orthopnea or PND. Feels weak. Appetite improved   On IV abx. Remains AF.    Objective:   Weight Range:  Vital Signs:   Temp:  [97.6 F (36.4 C)-98.6 F (37 C)] 98.5 F (36.9 C) (01/08 0719) Pulse Rate:  [65-104] 104 (01/08 0719) Resp:  [17-20] 20 (01/08 0719) BP: (135-189)/(66-149) 152/88 (01/08 0719) SpO2:  [90 %-98 %] 95 % (01/08 0719) FiO2 (%):  [32 %] 32 % (01/07 1346) Weight:  [89.8 kg-92.8 kg] 89.8 kg (01/08 0621) Last BM Date: 06/28/21  Weight change: Filed Weights   06/29/21 1600 06/30/21 0621  Weight: 92.8 kg 89.8 kg    Intake/Output:   Intake/Output Summary (Last 24 hours) at 06/30/2021 1009 Last data filed at 06/30/2021 0800 Gross per 24 hour  Intake 400 ml  Output 750 ml  Net -350 ml     Physical Exam: General:  Sitting up in bed No resp difficulty HEENT: normal Neck: supple. JVP hard to see. Looks ok. Carotids 2+ bilat; no bruits. No lymphadenopathy or thryomegaly appreciated. Cor: PMI nondisplaced. Irregular tachy. No rubs, gallops or murmurs. Lungs: clear Abdomen: obese soft, nontender, nondistended. No hepatosplenomegaly. No bruits or masses. Good bowel sounds. Extremities: no cyanosis, clubbing, rash, 1+ edema Neuro: alert & orientedx3, cranial nerves grossly intact. moves all 4 extremities w/o difficulty. Affect pleasant  Telemetry: AF 110-120s Personally reviewed  Labs: Basic Metabolic Panel: Recent Labs  Lab 06/27/21 0828 06/27/21 1749 06/28/21 0500 06/29/21 1254 06/30/21 0522  NA 129* 131* 134* 140 138  K 4.5 4.5 3.8 3.9 3.9  CL 95* 97* 100 96* 94*  CO2 24 25 30  34* 34*  GLUCOSE 227* 257* 242* 226* 175*  BUN 90* 84* 76* 31* 19  CREATININE 2.05* 1.68* 1.46* 0.96 0.80  CALCIUM 7.2* 7.3* 7.5* 8.3* 8.1*    Liver Function Tests: Recent Labs  Lab 06/26/21 0847 06/28/21 0500  06/29/21 1254 06/29/21 1608 06/30/21 0522  AST 16 13* 14* 13* 11*  ALT 11 12 11 12 9   ALKPHOS 137* 123 126 122 99  BILITOT 1.9* 1.0 1.1 0.9 0.9  PROT 6.6 6.3* 6.4* 6.8 5.8*  ALBUMIN 2.3* 2.4* 2.3* 2.3* 2.0*   Recent Labs  Lab 06/24/21 1456  LIPASE 45   No results for input(s): AMMONIA in the last 168 hours.  CBC: Recent Labs  Lab 06/26/21 0847 06/28/21 0500 06/29/21 0507 06/29/21 1254 06/30/21 0522  WBC 14.3* 9.3 15.8* 18.3* 20.6*  NEUTROABS  --  7.3 11.9*  --   --   HGB 8.2* 7.7* 8.0* 8.9* 8.0*  HCT 25.8* 23.4* 24.4* 27.1* 24.5*  MCV 84.3 83.9 83.6 84.2 85.1  PLT 93* 118* 135* 155 166    Cardiac Enzymes: No results for input(s): CKTOTAL, CKMB, CKMBINDEX, TROPONINI in the last 168 hours.  BNP: BNP (last 3 results) Recent Labs    08/24/20 1506 03/27/21 1530 06/25/21 1350  BNP 51.2 31.1 344.7*    ProBNP (last 3 results) No results for input(s): PROBNP in the last 8760 hours.    Other results:  Imaging: ECHO TEE  Result Date: 06/28/2021    TRANSESOPHOGEAL ECHO REPORT   Patient Name:   Debra Saunders Date of Exam: 06/28/2021 Medical Rec #:  [id number]        Height:       67.0 in Accession #:  [id number]       Weight:       192.0 lb Date of Birth:  1946-05-03         BSA:          1.988 m Patient Age:    76 years         BP:           119/44 mmHg Patient Gender: F                HR:           71 bpm. Exam Location:  [facility] Procedure: Transesophageal Echo, Cardiac Doppler, Color Doppler and Saline            Contrast Bubble Study Indications:     Bacteremia R78.81  History:         Patient has prior history of Echocardiogram examinations, most                  recent 06/26/2021. Pacemaker, Arrythmias:Atrial Fibrillation;                  Risk Factors:Hypertension.  Sonographer:     Sherrie Sport Referring Phys:  [id number] [name] Diagnosing Phys: Ida Rogue MD PROCEDURE: After discussion of the risks and benefits of a TEE, an informed consent was obtained from the  patient. TEE procedure time was 30 minutes. The transesophogeal probe was passed without difficulty through the esophogus of the patient. Imaged were obtained with the patient in a left lateral decubitus position. Local oropharyngeal anesthetic was provided with Benzocaine spray and Cetacaine. Sedation performed by performing physician. Patients was under conscious sedation during this procedure.  Anesthetic administered: 63mcg of Fentanyl, 3mg  of Versed. Image quality was excellent. The patient's vital signs; including heart rate, blood pressure, and oxygen saturation; remained stable throughout the procedure. The patient developed no complications during the procedure. IMPRESSIONS  1. Large mobile vegetation on the RV pacing wire noted in the right atrium, with estimated to be 1 x 1.5 cm in size or larger.  2. Left ventricular ejection fraction, by estimation, is 55 to 60%. The left ventricle has normal function. The left ventricle has no regional wall motion abnormalities. There is mild left ventricular hypertrophy.  3. Right ventricular systolic function is normal. The right ventricular size is normal.  4. No left atrial/left atrial appendage thrombus was detected.  5. The mitral valve is normal in structure. Mild to moderate mitral valve regurgitation. No evidence of mitral stenosis.  6. The aortic valve is normal in structure. There is moderate calcification of the aortic valve. Aortic valve regurgitation is not visualized. No aortic stenosis is present.  7. There is mild (Grade II) atheroma plaque involving the aortic arch and descending aorta.  8. Agitated saline contrast bubble study was negative, with no evidence of any interatrial shunt. Conclusion(s)/Recommendation(s): Normal biventricular function without evidence of hemodynamically significant valvular heart disease. FINDINGS  Left Ventricle: Left ventricular ejection fraction, by estimation, is 55 to 60%. The left ventricle has normal function. The  left ventricle has no regional wall motion abnormalities. The left ventricular internal cavity size was normal in size. There is  mild left ventricular hypertrophy. Right Ventricle: The right ventricular size is normal. No increase in right ventricular wall thickness. Right ventricular systolic function is normal. Left Atrium: Left atrial size was normal in size. No left atrial/left atrial appendage thrombus was detected. Right Atrium: Right atrial size was normal in size. Pericardium: There is no  evidence of pericardial effusion. Mitral Valve: The mitral valve is normal in structure. Mild to moderate mitral valve regurgitation. No evidence of mitral valve stenosis. Tricuspid Valve: The tricuspid valve is normal in structure. Tricuspid valve regurgitation is not demonstrated. No evidence of tricuspid stenosis. Aortic Valve: The aortic valve is normal in structure. There is moderate calcification of the aortic valve. Aortic valve regurgitation is not visualized. No aortic stenosis is present. There is no evidence of aortic valve vegetation. Pulmonic Valve: The pulmonic valve was normal in structure. Pulmonic valve regurgitation is not visualized. No evidence of pulmonic stenosis. Aorta: The aortic root is normal in size and structure. There is mild (Grade II) atheroma plaque involving the aortic arch and descending aorta. IAS/Shunts: No atrial level shunt detected by color flow Doppler. Agitated saline contrast was given intravenously to evaluate for intracardiac shunting. Agitated saline contrast bubble study was negative, with no evidence of any interatrial shunt. Additional Comments: A device lead is visualized. Ida Rogue MD Electronically signed by Ida Rogue MD Signature Date/Time: 06/28/2021/5:38:15 PM    Final (Updated)      Medications:     Scheduled Medications:  Difluprednate  1 drop Both Eyes BID   digoxin  0.125 mg Oral Daily   diltiazem  30 mg Oral Q6H   DULoxetine  60 mg Oral Daily    gabapentin  600 mg Oral TID   insulin aspart  0-9 Units Subcutaneous TID WC   macitentan  10 mg Oral Daily   pantoprazole (PROTONIX) IV  40 mg Intravenous Q24H   rosuvastatin  10 mg Oral Daily   Selexipag  800 mcg Oral BID   sildenafil  20 mg Oral TID   sodium chloride flush  3 mL Intravenous Q12H   spironolactone  25 mg Oral Daily    Infusions:  sodium chloride     sodium chloride 75 mL/hr at 06/30/21 0530   cefTRIAXone (ROCEPHIN)  IV 2 g (06/29/21 1355)   heparin 1,000 Units/hr (06/30/21 0648)    PRN Medications: sodium chloride, acetaminophen **OR** acetaminophen, albuterol, hydrALAZINE, HYDROcodone-acetaminophen, morphine injection, senna-docusate, sodium chloride flush   Assessment/Plan:   1. 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 - shock resolved - ID following  - Continue ceftriaxone - EP following for pacer extraction - tentatively tomorrow    2. Acute on chronichypoxic respiratory failure with CAP - has underlying ILD. On home O2 - ? Septic embolization to lung -  ID following  - Continue ceftriaxone   3. Pulmonary hypertension: She was followed in the past at [facility].  Based on 4/19 cath, she had severe PAH with PVR 7.2 WU. Chronic hypoxemic respiratory failure, on home oxygen.  She has a history of remote DVT (2004) but V/Q scan negative for evidence of chronic PE.  High resolution CT chest in 10/19 showed pulmonary fibrosis, and PFTs in 2017 showed severe restriction.  Serological workup for rheumatological diseases was unremarkable.  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 Amiodarone was stopped given lung parenchymal disease. 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   - PH meds resumed sildenafil 20 tid, macitentan 10 daily - Decision made to resume selexipeg at 800 bid (half PTA dose) given that she was off > 3 days. Tolerating  well. Can likely increase to 1600 po bid prior to d/c - Continue digoxin for RV support - Continue CPAP and home oxygen.    4. PAF with tachy-brady syndrome - back in AF now - amio stopped in 2019 due to underlying lung disease.  - Failing rate control with oral cardizem. Will use IV amio while in hospital. If doesn't convert will need TEE/DC-CV. (Was off AFC for several days) - Will d/w EP regarding need for temp pacer with device removal - On apixaban PTA. Stopped due to concern over GIB on admit. Hgb now stable.  - Continue heparin until procedures complete then switch back to Eliquis   5. AKI due to ATN/shock - resolved - baseline Cr 1.03 - peak Scr  3.21 -> 0.80 today   6. Chronic diastolic HF - volume status looks up - restart torsemide - consider SGLT2i prior to d/c   7. Severe HTN - resume home meds - cover with PRN hydrlazine   8. H/o DVT/PE - resume AC   9. DM2 - insulin. - cover with SSI    10. OSA on CPAP - continue CPAP   Length of Stay: 1   Glori Bickers MD 06/30/2021, 10:09 AM  Advanced Heart Failure Team Pager [phone] (M-F; 7a - 4p)  Please contact [facility] for night-coverage after hours (4p -7a ) and weekends on [email or web address]

## 2021-07-01 DIAGNOSIS — T827XXA Infection and inflammatory reaction due to other cardiac and vascular devices, implants and grafts, initial encounter: Principal | ICD-10-CM

## 2021-07-01 DIAGNOSIS — L8990 Pressure ulcer of unspecified site, unspecified stage: Secondary | ICD-10-CM | POA: Diagnosis present

## 2021-07-01 LAB — CBC
HCT: 27.8 % — ABNORMAL LOW (ref 36.0–46.0)
Hemoglobin: 8.6 g/dL — ABNORMAL LOW (ref 12.0–15.0)
MCH: 26.7 pg (ref 26.0–34.0)
MCHC: 30.9 g/dL (ref 30.0–36.0)
MCV: 86.3 fL (ref 80.0–100.0)
Platelets: 208 10*3/uL (ref 150–400)
RBC: 3.22 MIL/uL — ABNORMAL LOW (ref 3.87–5.11)
RDW: 14.6 % (ref 11.5–15.5)
WBC: 19.7 10*3/uL — ABNORMAL HIGH (ref 4.0–10.5)
nRBC: 0.2 % (ref 0.0–0.2)

## 2021-07-01 LAB — HEPARIN LEVEL (UNFRACTIONATED)
Heparin Unfractionated: 0.19 IU/mL — ABNORMAL LOW (ref 0.30–0.70)
Heparin Unfractionated: 0.35 IU/mL (ref 0.30–0.70)

## 2021-07-01 LAB — GLUCOSE, CAPILLARY
Glucose-Capillary: 182 mg/dL — ABNORMAL HIGH (ref 70–99)
Glucose-Capillary: 156 mg/dL — ABNORMAL HIGH (ref 70–99)
Glucose-Capillary: 198 mg/dL — ABNORMAL HIGH (ref 70–99)
Glucose-Capillary: 210 mg/dL — ABNORMAL HIGH (ref 70–99)

## 2021-07-01 LAB — BASIC METABOLIC PANEL
Anion gap: 7 (ref 5–15)
BUN: 17 mg/dL (ref 8–23)
CO2: 35 mmol/L — ABNORMAL HIGH (ref 22–32)
Calcium: 8 mg/dL — ABNORMAL LOW (ref 8.9–10.3)
Chloride: 95 mmol/L — ABNORMAL LOW (ref 98–111)
Creatinine, Ser: 0.86 mg/dL (ref 0.44–1.00)
GFR, Estimated: >60 mL/min (ref >60)
Glucose, Bld: 176 mg/dL — ABNORMAL HIGH (ref 70–99)
Potassium: 3.4 mmol/L — ABNORMAL LOW (ref 3.5–5.1)
Sodium: 137 mmol/L (ref 135–145)

## 2021-07-01 LAB — PROCALCITONIN: Procalcitonin: 0.55 ng/mL

## 2021-07-01 LAB — MAGNESIUM: Magnesium: 1.8 mg/dL (ref 1.7–2.4)

## 2021-07-01 MED ORDER — MAGNESIUM SULFATE 2 GM/50ML IV SOLN
2.0000 g | Freq: Once | INTRAVENOUS | Status: AC
  Administered 2021-07-01: 2 g via INTRAVENOUS
  Filled 2021-07-01: qty 50

## 2021-07-01 MED ORDER — SELEXIPAG 1600 MCG PO TABS
1600.0000 ug | ORAL_TABLET | Freq: Two times a day (BID) | ORAL | Status: DC
  Administered 2021-07-01 – 2021-07-02 (×3): 1600 ug via ORAL
  Filled 2021-07-01 (×4): qty 1

## 2021-07-01 MED ORDER — POTASSIUM CHLORIDE CRYS ER 20 MEQ PO TBCR
40.0000 meq | EXTENDED_RELEASE_TABLET | Freq: Two times a day (BID) | ORAL | Status: AC
  Administered 2021-07-01 (×2): 40 meq via ORAL
  Filled 2021-07-01 (×2): qty 2

## 2021-07-01 MED ORDER — SENNOSIDES-DOCUSATE SODIUM 8.6-50 MG PO TABS
1.0000 | ORAL_TABLET | Freq: Once | ORAL | Status: AC
  Administered 2021-07-01: 1 via ORAL
  Filled 2021-07-01: qty 1

## 2021-07-01 MED ORDER — POLYETHYLENE GLYCOL 3350 17 G PO PACK
17.0000 g | PACK | Freq: Two times a day (BID) | ORAL | Status: DC
  Administered 2021-07-01 – 2021-07-10 (×4): 17 g via ORAL
  Filled 2021-07-01 (×13): qty 1

## 2021-07-01 NOTE — Progress Notes (Signed)
Physical Therapy Treatment Patient Details Name: Debra Saunders MRN: [id number] DOB: 01-Dec-1945 Today's Date: 07/01/2021   History of Present Illness Pt is a 76 y.o. admitted 06/25/21 with c/o SOB, fever, chills, abdominal pain. Workup for septic shock secondary to klebsiella UTI and strep bacteremia with GIB, multifocal PNA, possible septic lung emboli. 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 Transfer to [facility] 06/29/21. Potential for PPM extraction 1/9. PMH includes HTN, afib, pacemaker, PAH, chronic resp failure (on home O2), CHF, cardiorenal syndrome, DM2, OSA, stroke, ICH, chronic back pain, arthritis.    PT Comments    The pt was seen for continued progression of activity tolerance, endurance, and to try use of 4-wheel walker. The pt was able to make good progress in both standing tolerance and ambulation distance, but continues to fatigue quickly and therefore benefits from seated rest 4-wheel walker offers. The pt was able to rise to standing from the walker with minG only, but requires increased time to power up to standing. Will continue to benefit from max PT and mobility to progress to functional level that is safe for pt to do at home as she remains against d/c plan of SNF.     Recommendations for follow up therapy are one component of a multi-disciplinary discharge planning process, led by the attending physician.  Recommendations may be updated based on patient status, additional functional criteria and insurance authorization.  Follow Up Recommendations  Home health PT (pt declining SNF)     Assistance Recommended at Discharge Frequent or constant Supervision/Assistance  Patient can return home with the following A little help with walking and/or transfers;A little help with bathing/dressing/bathroom;Assistance with cooking/housework;Assist for transportation;Help with stairs or ramp for entrance   Equipment  Recommendations  Rollator (4 wheels)    Recommendations for Other Services       Precautions / Restrictions Precautions Precautions: Fall;Other (comment) Precaution Comments: bladder/bowel incontinence, watch HR, wears 4L O2 baseline Restrictions Weight Bearing Restrictions: No     Mobility  Bed Mobility Overal bed mobility: Needs Assistance Bed Mobility: Sit to Supine     Supine to sit: Supervision;HOB elevated Sit to supine: Min assist   General bed mobility comments: minA to BLE to return to bed    Transfers Overall transfer level: Needs assistance Equipment used: Rollator (4 wheels) Transfers: Sit to/from Stand Sit to Stand: Min guard           General transfer comment: minG with increased time to power up    Ambulation/Gait Ambulation/Gait assistance: Min assist Gait Distance (Feet): 15 Feet (+ 25 ft) Assistive device: Rollator (4 wheels) Gait Pattern/deviations: Step-to pattern;Trunk flexed;Wide base of support Gait velocity: Decreased Gait velocity interpretation: <1.31 ft/sec, indicative of household ambulator   General Gait Details: small steps with heavy reliance on BUE support. No overt LOB. SpO2 stable on 3L with gait      Balance Overall balance assessment: Needs assistance   Sitting balance-Leahy Scale: Fair     Standing balance support: Bilateral upper extremity supported;During functional activity Standing balance-Leahy Scale: Poor Standing balance comment: BUE support, limited to 1-2 min standing at a time due to fatigue                            Cognition Arousal/Alertness: Awake/alert Behavior During Therapy: WFL for tasks assessed/performed Overall Cognitive Status: Within Functional Limits for tasks assessed  General Comments: pt followig all commands and able to express needs        Exercises      General Comments General comments (skin integrity, edema, etc.):  3/4 DOE with SpO2 >92% on 3L. HR max 145bpm but mostly 110-120s      Pertinent Vitals/Pain Pain Assessment: Faces Pain Score: 0-No pain Faces Pain Scale: Hurts little more Pain Location: headache Pain Descriptors / Indicators: Discomfort;Grimacing Pain Intervention(s): Monitored during session    Home Living Family/patient expects to be discharged to:: Private residence Living Arrangements: Alone Available Help at Discharge: Family;Available PRN/intermittently Type of Home: House Home Access: Ramped entrance       Home Layout: One level Home Equipment: Rollator (4 wheels);Tub bench;BSC/3in1      Prior Function            PT Goals (current goals can now be found in the care plan section) Acute Rehab PT Goals Patient Stated Goal: "I'll be going back to my own home" PT Goal Formulation: With patient Time For Goal Achievement: 07/14/21 Potential to Achieve Goals: Good Progress towards PT goals: Progressing toward goals    Frequency    Min 3X/week      PT Plan Current plan remains appropriate       AM-PAC PT "6 Clicks" Mobility   Outcome Measure  Help needed turning from your back to your side while in a flat bed without using bedrails?: A Little Help needed moving from lying on your back to sitting on the side of a flat bed without using bedrails?: A Little Help needed moving to and from a bed to a chair (including a wheelchair)?: A Little Help needed standing up from a chair using your arms (e.g., wheelchair or bedside chair)?: A Little Help needed to walk in hospital room?: A Lot Help needed climbing 3-5 steps with a railing? : Total 6 Click Score: 15    End of Session Equipment Utilized During Treatment: Oxygen;Gait belt Activity Tolerance: Patient tolerated treatment well Patient left: in bed;with bed alarm set;with call bell/phone within reach Nurse Communication: Mobility status PT Visit Diagnosis: Other abnormalities of gait and mobility  (R26.89);Muscle weakness (generalized) (M62.81)     Time: 9024-0973 PT Time Calculation (min) (ACUTE ONLY): 33 min  Charges:  $Therapeutic Exercise: 8-22 mins $Therapeutic Activity: 8-22 mins                     West Carbo, PT, DPT   Acute Rehabilitation Department Pager #: [phone]   Sandra Cockayne 07/01/2021, 6:29 PM

## 2021-07-01 NOTE — Evaluation (Signed)
Occupational Therapy Evaluation Patient Details Name: Debra Saunders MRN: [id number] DOB: [birth year] Today's Date: 07/01/2021   History of Present Illness Pt is a 76 y.o. admitted 06/25/21 with c/o SOB, fever, chills, abdominal pain. Workup for septic shock secondary to klebsiella UTI and strep bacteremia with GIB, multifocal PNA, possible septic lung emboli. 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 Transfer to [facility] 06/29/21. Potential for PPM extraction 1/9. PMH includes HTN, afib, pacemaker, PAH, chronic resp failure (on home O2), CHF, cardiorenal syndrome, DM2, OSA, stroke, ICH, chronic back pain, arthritis.   Clinical Impression   Pt presents with decline in function and safety with ADLs and ADL mobility with impaired strength, balance and endurance; pt on 4L home O2. Pt lives at home alone and was Ind with ADLs/selfcare, used a RW or for mobility, was able to meal prep; her daughter lives nearby and and assists with transportation, grocery shopping and housework. Pt currently requires min A with transfers, mod A with ADLs and toileting tasks. Pt fatigues easily and requires cues for breathing techniques to maintain O2 SATs >88% during activity. Pt would benefit from acute OT services to address impairments to maximize level of function and safety      Recommendations for follow up therapy are one component of a multi-disciplinary discharge planning process, led by the attending physician.  Recommendations may be updated based on patient status, additional functional criteria and insurance authorization.   Follow Up Recommendations  Home health OT (adamantly refusing post acute rehab setting)    Assistance Recommended at Discharge Frequent or constant Supervision/Assistance  Patient can return home with the following A lot of help with bathing/dressing/bathroom;A little help with walking and/or transfers;Assist for  transportation;Assistance with cooking/housework    Functional Status Assessment  Patient has had a recent decline in their functional status and demonstrates the ability to make significant improvements in function in a reasonable and predictable amount of time.  Equipment Recommendations  None recommended by OT    Recommendations for Other Services       Precautions / Restrictions Precautions Precautions: Fall;Other (comment) Precaution Comments: bladder/bowel incontinence, watch HR, wears 4L O2 baseline Restrictions Weight Bearing Restrictions: No      Mobility Bed Mobility Overal bed mobility: Needs Assistance Bed Mobility: Supine to Sit     Supine to sit: Supervision;HOB elevated     General bed mobility comments: increased time and effort, cues for breathing and activity pacing once seated EOB, no physical assist required    Transfers Overall transfer level: Needs assistance Equipment used: Rolling walker (2 wheels) Transfers: Sit to/from Stand Sit to Stand: Min assist           General transfer comment: Able to stand from EOB and BSC to RW, cues for hand placement      Balance Overall balance assessment: Needs assistance   Sitting balance-Leahy Scale: Fair     Standing balance support: Bilateral upper extremity supported;During functional activity Standing balance-Leahy Scale: Poor                             ADL either performed or assessed with clinical judgement   ADL Overall ADL's : Needs assistance/impaired Eating/Feeding: Independent   Grooming: Wash/dry hands;Wash/dry face;Min guard;Standing   Upper Body Bathing: Set up;Supervision/ safety;Sitting   Lower Body Bathing: Moderate assistance   Upper Body Dressing : Set up;Supervision/safety;Sitting   Lower Body Dressing: Moderate assistance  Toilet Transfer: Minimal assistance;Ambulation;Rolling walker (2 wheels);BSC/3in1   Toileting- Clothing Manipulation and Hygiene:  Moderate assistance;Sit to/from stand       Functional mobility during ADLs: Minimal assistance;Rolling walker (2 wheels) General ADL Comments: pt fatigues quickly     Vision Baseline Vision/History: 1 Wears glasses Ability to See in Adequate Light: 0 Adequate Patient Visual Report: No change from baseline       Perception     Praxis      Pertinent Vitals/Pain Pain Assessment: No/denies pain Pain Score: 0-No pain Pain Intervention(s): Monitored during session     Hand Dominance Right   Extremity/Trunk Assessment Upper Extremity Assessment Upper Extremity Assessment: Generalized weakness   Lower Extremity Assessment Lower Extremity Assessment: Defer to PT evaluation       Communication Communication Communication: No difficulties   Cognition   Behavior During Therapy: WFL for tasks assessed/performed Overall Cognitive Status: Within Functional Limits for tasks assessed                                       General Comments       Exercises     Shoulder Instructions      Home Living Family/patient expects to be discharged to:: Private residence Living Arrangements: Alone Available Help at Discharge: Family;Available PRN/intermittently Type of Home: House Home Access: Ramped entrance     Home Layout: One level     Bathroom Shower/Tub: Teacher, early years/pre: Handicapped height     Home Equipment: Rollator (4 wheels);Tub bench;BSC/3in1          Prior Functioning/Environment Prior Level of Function : Independent/Modified Independent;Needs assist               ADLs Comments: Typically mod indep with ADLs, bathes seated on tub bench. On weeks leading to admission, daughter needing to assist more around house, especially with cleaning from frequent bowel incontinence (pt reports she hasn't been eating much since Thanksgiving due to incontinence "any time I eat"). Pt reports indep with med management        OT  Problem List: Decreased strength;Impaired balance (sitting and/or standing);Decreased activity tolerance;Decreased knowledge of use of DME or AE      OT Treatment/Interventions: Self-care/ADL training;Patient/family education;Therapeutic activities;DME and/or AE instruction;Energy conservation    OT Goals(Current goals can be found in the care plan section) Acute Rehab OT Goals Patient Stated Goal: go home OT Goal Formulation: With patient Time For Goal Achievement: 07/15/21 ADL Goals Pt Will Perform Grooming: with set-up;with supervision;standing Pt Will Perform Upper Body Bathing: with set-up;with modified independence Pt Will Perform Lower Body Bathing: with min assist;with min guard assist Pt Will Perform Upper Body Dressing: with set-up;with modified independence Pt Will Perform Lower Body Dressing: with min assist Pt Will Transfer to Toilet: with min guard assist;with supervision;ambulating Pt Will Perform Toileting - Clothing Manipulation and hygiene: with min assist;with min guard assist;sit to/from stand  OT Frequency: Min 2X/week    Co-evaluation              AM-PAC OT "6 Clicks" Daily Activity     Outcome Measure Help from another person eating meals?: None Help from another person taking care of personal grooming?: A Little Help from another person toileting, which includes using toliet, bedpan, or urinal?: A Lot Help from another person bathing (including washing, rinsing, drying)?: A Lot Help from another person to put on and taking  off regular upper body clothing?: A Little Help from another person to put on and taking off regular lower body clothing?: A Lot 6 Click Score: 16   End of Session Equipment Utilized During Treatment: Gait belt;Rolling walker (2 wheels);Other (comment) (BSC)  Activity Tolerance: Patient limited by fatigue Patient left: in chair;with call bell/phone within reach;with nursing/sitter in room  OT Visit Diagnosis: Other abnormalities of  gait and mobility (R26.89);Muscle weakness (generalized) (M62.81)                Time: 2787-1836 OT Time Calculation (min): 20 min Charges:  OT General Charges $OT Visit: 1 Visit OT Evaluation $OT Eval Moderate Complexity: 1 Mod    Britt Bottom 07/01/2021, 2:37 PM

## 2021-07-01 NOTE — Consult Note (Addendum)
ELECTROPHYSIOLOGY CONSULT NOTE    Patient ID: Debra Saunders MRN: [id number], DOB/AGE: 09/23/1945 76 y.o.  Admit date: 06/29/2021 Date of Consult: 07/01/2021  Primary Physician: [name], MD Primary Cardiologist: [name], MD  Electrophysiologist: Dr. Caryl Comes  Referring Provider: Dr. Tyrell Antonio  Patient Profile: Debra Saunders is a 76 y.o. female with medical history significant of  hypertension, hyperlipidemia, diabetes mellitus, depression, subdural hematoma 2014, sinoatrial node dysfunction (s/p for pacemaker placement, OSA, PAH, interstitial lung disease on 4 L oxygen, dCHF, atrial fibrillation on Eliquis, CKD stage IIIa, cardiorenal syndrome, anemia, DVT on Eliquis, who presented to [facility] on 06/25/2021 with shortness breath, cough, fever, chills, burning on urination, dysuria and RUQ pain x 3 weeks. Seen today for infective endocarditis at request of Dr. Tyrell Antonio  HPI:  Debra Saunders is a 76 y.o. female with medical history as above.   She was admitted to [facility] on 1/3 January 3 with complaints of dyspnea, cough, fevers, chills.  She was found to pneumonia and was in septic shock treated with antibiotics and pressors.  Blood cultures positive for Streptococcus group G.  All her PAH meds were held.  TEE 06/29/20 showed  large vegetation on the pacer wire in the right atrium above the tricuspid valve (1 x 1.5 cm).  No vegetations noted on the valves.  Ejection fraction was 55% and mild/mod MR. RV ok. She was transferred to [facility] for pacemaker extraction discussions.     Feels weak. Poor appetite/ Had severe RUQ pain but now improving  Afebrile. Denies CP, SOB, orthopnea or PND. SBP 170-180s. Noted to be back in AF. (Previously on amio but stopped due to potential lung toxicity)    Prior to admit she was on sildenafil 20 TID, macitentan 10 daily and selexipag 1600 bid  She is currently on nasal CPAP on my exam, O2 around 91%. No acute SOB at this time. Repeats  history above that she was sick for 2-3 weeks, just thought she had a cold that wouldn't go away.   Past Medical History:  Diagnosis Date   Arthritis    Atrial fibrillation, persistent ([place])    a. s/p TEE-guided DCCV on 08/20/2017   Cardiorenal syndrome    a. 09/2017 Creat rose to 2.7 w/ diuresis-->HD x 2.   Chest pain    a. 01/2012 Cath: nonobs dzs;  c. 04/2014 MV: No ischemia.   Chronic back pain    Chronic combined systolic (congestive) and diastolic (congestive) heart failure ([place])    a. Dx 2005 @ [facility];  b. 02/2014 Echo: EF 55-60%; c. 07/2017: EF 30-35%, diffuse HK, trivial AI, severe MR, moderate TR; d. 09/2017 Echo: EF 40-45%; e. 02/2018 Echo: EF 55-60%, Gr1 DD; f. 08/2019 Echo: EF 60-65%, Gr2 DD, Nl RV fxn, RVSP 49.100mmHg. Mod-Sev MR. Mild to mod TR. Mod PR. RA ~ 5mmHg.   Chronic respiratory failure (HCC)    a. on home O2   Gastritis    History of intracranial hemorrhage    a. 6/14 described by neurosurgery as "small frontal posttraumatic SAH " - pt was on xarelto @ the time.   Hyperlipidemia    Hypertension    Interstitial lung disease (HCC)    Lipoma of back    Lymphedema    a. Chronic LLE edema.   NICM (nonischemic cardiomyopathy) ([place])    a. 09/2017 Echo: EF 40-45%; b. 09/2017 Cath: minor, insignificant CAD; c. 02/2018 Echo: EF 55-60%, Gr1 DD; d. 08/2019 Echo: EF 60-65%, Gr2  DD.   Obesity    PAH (pulmonary artery hypertension) ([place])    a. 09/2017 Echo: PASP 6mmHg; b. 09/2017 RHC: RA 23, RV 84/13, PA 84/29, PCWP 20, CO 3.89, CI 1.89. LVEDP 18.   Presence of permanent cardiac pacemaker 01/09/2014   Sepsis with metabolic encephalopathy ([place])    Sleep apnea    Streptococcal bacteremia    Stroke [facility]) 2014   Subarachnoid hemorrhage ([place]) 01/19/2013   Symptomatic bradycardia    a. s/p BSX PPM.   Thyroid nodule    Type II diabetes mellitus ([place])    Urinary incontinence      Surgical History:  Past Surgical History:  Procedure Laterality Date   ABDOMINAL HYSTERECTOMY  1969    BREAST BIOPSY Right 10/28/2019   lymph node bx, [place] Bx, pending path    CARDIAC CATHETERIZATION  2013   @ [place]: No obstructive CAD: Only 20% ostial left circumflex.   CARDIOVERSION N/A 10/12/2017   Procedure: CARDIOVERSION;  Surgeon: Minna Merritts, MD;  Location: [facility] ORS;  Service: Cardiovascular;  Laterality: N/A;   CATARACT EXTRACTION W/PHACO Right 09/19/2020   Procedure: CATARACT EXTRACTION PHACO AND INTRAOCULAR LENS PLACEMENT (IOC) RIGHT DIABETIC 3.95 00:43.0 9.2%;  Surgeon: Leandrew Koyanagi, MD;  Location: [place];  Service: Ophthalmology;  Laterality: Right;  Pt requests to be last sleep apnea   CATARACT EXTRACTION W/PHACO Left 10/03/2020   Procedure: CATARACT EXTRACTION PHACO AND INTRAOCULAR LENS PLACEMENT ([place]) LEFT DIABETIC;  Surgeon: Leandrew Koyanagi, MD;  Location: [place];  Service: Ophthalmology;  Laterality: Left;  3.25 0:59.3 5.5%   CHOLECYSTECTOMY     COLONOSCOPY WITH PROPOFOL N/A 09/10/2018   Procedure: COLONOSCOPY WITH PROPOFOL;  Surgeon: Manya Silvas, MD;  Location: [facility] ENDOSCOPY;  Service: Endoscopy;  Laterality: N/A;   CYST REMOVAL TRUNK  2002   BACK   DIALYSIS/PERMA CATHETER INSERTION N/A 10/06/2017   Procedure: DIALYSIS/PERMA CATHETER INSERTION;  Surgeon: Katha Cabal, MD;  Location: [place] CV LAB;  Service: Cardiovascular;  Laterality: N/A;   INSERT / REPLACE / REMOVE PACEMAKER     lipoma removal  2014   back   PACEMAKER INSERTION  2015   [employer] dual chamber pacemaker implanted by Dr Caryl Comes for symptomatic bradycardia   PERMANENT PACEMAKER INSERTION N/A 01/09/2014   Procedure: PERMANENT PACEMAKER INSERTION;  Surgeon: Deboraha Sprang, MD;  Location: [facility] CATH LAB;  Service: Cardiovascular;  Laterality: N/A;   RIGHT HEART CATH N/A 04/19/2018   Procedure: RIGHT HEART CATH;  Surgeon: Larey Dresser, MD;  Location: [place] CV LAB;  Service: Cardiovascular;  Laterality: N/A;   RIGHT HEART CATH N/A 08/30/2020    Procedure: RIGHT HEART CATH;  Surgeon: Larey Dresser, MD;  Location: [place] CV LAB;  Service: Cardiovascular;  Laterality: N/A;   RIGHT/LEFT HEART CATH AND CORONARY ANGIOGRAPHY N/A 10/19/2017   Procedure: RIGHT/LEFT HEART CATH AND CORONARY ANGIOGRAPHY;  Surgeon: Wellington Hampshire, MD;  Location: [place] CV LAB;  Service: Cardiovascular;  Laterality: N/A;   TEE WITHOUT CARDIOVERSION N/A 08/20/2017   Procedure: TRANSESOPHAGEAL ECHOCARDIOGRAM (TEE) & Direct current cardioversion;  Surgeon: Minna Merritts, MD;  Location: [facility] ORS;  Service: Cardiovascular;  Laterality: N/A;   TEE WITHOUT CARDIOVERSION N/A 06/28/2021   Procedure: TRANSESOPHAGEAL ECHOCARDIOGRAM (TEE);  Surgeon: Minna Merritts, MD;  Location: [facility] ORS;  Service: Cardiovascular;  Laterality: N/A;   TRIGGER FINGER RELEASE       Medications Prior to Admission  Medication Sig Dispense Refill Last Dose  acetaminophen (TYLENOL) 325 MG tablet Take 325-650 mg by mouth every 6 (six) hours as needed for mild pain.   Past Week   apixaban (ELIQUIS) 5 MG TABS tablet Take 5 mg by mouth 2 (two) times daily.   Past Month   Difluprednate 0.05 % EMUL Place 1 drop into both eyes 2 (two) times daily.   Past Week   digoxin (LANOXIN) 0.125 MG tablet Take by mouth daily.   Past Week   DULoxetine (CYMBALTA) 30 MG capsule TAKE 1 CAPSULE BY MOUTH ONCE DAILY FOR 14 DAYS, THEN INCREASE TO 2 CAPSULES ONCE DAILY (Patient taking differently: Take 60 mg by mouth daily.) 60 capsule 0 Past Week   gabapentin (NEURONTIN) 300 MG capsule Take 600 mg by mouth 3 (three) times daily.   Past Week   macitentan (OPSUMIT) 10 MG tablet Take 10 mg by mouth daily.   Past Week   meclizine (ANTIVERT) 25 MG tablet Take 1 tablet (25 mg total) by mouth 3 (three) times daily as needed for dizziness. 30 tablet 1 Past Week   Menthol, Topical Analgesic, (BIOFREEZE EX) Apply 1 application topically 4 (four) times daily as needed (for pain).   Past Month   potassium  chloride SA (KLOR-CON M) 20 MEQ tablet Take 20 mEq by mouth 2 (two) times daily.   Past Week   rosuvastatin (CRESTOR) 10 MG tablet Take 1 tablet by mouth once daily 90 tablet 0 Past Week   Selexipag (UPTRAVI) 1600 MCG TABS Take 1,600 mcg by mouth.   Past Week   sildenafil (REVATIO) 20 MG tablet Take 20 mg by mouth 3 (three) times daily.   Past Week   spironolactone (ALDACTONE) 25 MG tablet Take 25 mg by mouth daily.   Past Week   torsemide (DEMADEX) 20 MG tablet Take 40 mg by mouth 2 (two) times daily.   Past Week   albuterol (ACCUNEB) 0.63 MG/3ML nebulizer solution Take 1 ampule by nebulization every 6 (six) hours as needed for wheezing.       Inpatient Medications:   Difluprednate  1 drop Both Eyes BID   digoxin  0.125 mg Oral Daily   diltiazem  30 mg Oral Q6H   DULoxetine  60 mg Oral Daily   gabapentin  600 mg Oral TID   insulin aspart  0-9 Units Subcutaneous TID WC   macitentan  10 mg Oral Daily   pantoprazole (PROTONIX) IV  40 mg Intravenous Q24H   rosuvastatin  10 mg Oral Daily   Selexipag  800 mcg Oral BID   sildenafil  20 mg Oral TID   sodium chloride flush  3 mL Intravenous Q12H   spironolactone  25 mg Oral Daily   torsemide  40 mg Oral BID    Allergies:  Allergies  Allergen Reactions   Aspirin Hives and Other (See Comments)    Pt states that she is unable to take because she had bleeding in her brain   Other Other (See Comments)    Pt states that she is unable to take blood thinners because she had bleeding in her brain; Blood Thinners-per doctor at [facility] (Xarelto)   Penicillins Hives, Shortness Of Breath, Swelling and Other (See Comments)    Has patient had a PCN reaction causing immediate rash, facial/tongue/throat swelling, SOB or lightheadedness with hypotension: Yes Has patient had a PCN reaction causing severe rash involving mucus membranes or skin necrosis: No Has patient had a PCN reaction that required hospitalization No Has patient had a PCN  reaction  occurring within the last 10 years: No If all of the above answers are "NO", then may proceed with Cephalosporin use.    Social History   Socioeconomic History   Marital status: Divorced    Spouse name: Not on file   Number of children: 4   Years of education: 12   Highest education level: High school graduate  Occupational History   Occupation: Retired- factory work    Comment: exposed to Pharmacist, community  Tobacco Use   Smoking status: Former    Packs/day: 0.30    Years: 2.00    Pack years: 0.60    Types: Cigarettes    Quit date: 06/23/1990    Years since quitting: 31.0   Smokeless tobacco: Never  Vaping Use   Vaping Use: Never used  Substance and Sexual Activity   Alcohol use: No   Drug use: No   Sexual activity: Not Currently  Other Topics Concern   Not on file  Social History Narrative   Lives in [place] with daughter. Has 4 children. No pets.      Work - Restaurant manager, fast food, retired      Diet - regular      02/14/2019: reports has Meals On Wheels delivered to her home;       Remote hx of smoking > 30 years; No alcohol-    Social Determinants of Health   Financial Resource Strain: Low Risk    Difficulty of Paying Living Expenses: Not very hard  Food Insecurity: No Food Insecurity   Worried About Charity fundraiser in the Last Year: Never true   Ran Out of Food in the Last Year: Never true  Transportation Needs: No Transportation Needs   Lack of Transportation (Medical): No   Lack of Transportation (Non-Medical): No  Physical Activity: Not on file  Stress: No Stress Concern Present   Feeling of Stress : Only a little  Social Connections: Socially Isolated   Frequency of Communication with Friends and Family: More than three times a week   Frequency of Social Gatherings with Friends and Family: Once a week   Attends Religious Services: Never   Marine scientist or Organizations: No   Attends Music therapist: Never   Marital Status: Divorced   Human resources officer Violence: Not At Risk   Fear of Current or Ex-Partner: No   Emotionally Abused: No   Physically Abused: No   Sexually Abused: No     Family History  Problem Relation Age of Onset   Heart disease Mother    Hypertension Mother    COPD Mother        was a smoker   Thyroid disease Mother    Hypertension Father    Hypertension Sister    Thyroid disease Sister    Breast cancer Sister    Hypertension Sister    Thyroid disease Sister    Bone cancer Sister    Breast cancer Maternal Aunt    Kidney disease Neg Hx    Bladder Cancer Neg Hx      Review of Systems: All other systems reviewed and are otherwise negative except as noted above.  Physical Exam: Vitals:   06/30/21 1937 06/30/21 2311 07/01/21 0404 07/01/21 0613  BP: (!) 145/63 140/77 111/81   Pulse: (!) 105 81 94   Resp: 18 20 20    Temp: (!) 97.4 F (36.3 C) 98.7 F (37.1 C) 98.8 F (37.1 C)   TempSrc: Oral Oral Oral  SpO2: 95% 94% 95%   Weight:    88.4 kg  Height:        GEN- The patient is well appearing, alert and oriented x 3 today.   HEENT: normocephalic, atraumatic; sclera clear, conjunctiva pink; hearing intact; oropharynx clear; neck supple Lungs- Clear to ausculation bilaterally, normal work of breathing.  No wheezes, rales, rhonchi Heart- Regular rate and rhythm, no murmurs, rubs or gallops GI- soft, non-tender, non-distended, bowel sounds present Extremities- no clubbing, cyanosis, or edema; DP/PT/radial pulses 2+ bilaterally MS- no significant deformity or atrophy Skin- warm and dry, no rash or lesion Psych- euthymic mood, full affect Neuro- strength and sensation are intact  Labs:   Lab Results  Component Value Date   WBC 19.7 (H) 07/01/2021   HGB 8.6 (L) 07/01/2021   HCT 27.8 (L) 07/01/2021   MCV 86.3 07/01/2021   PLT 208 07/01/2021    Recent Labs  Lab 06/30/21 0522 07/01/21 0011  NA 138 137  K 3.9 3.4*  CL 94* 95*  CO2 34* 35*  BUN 19 17  CREATININE 0.80 0.86   CALCIUM 8.1* 8.0*  PROT 5.8*  --   BILITOT 0.9  --   ALKPHOS 99  --   ALT 9  --   AST 11*  --   GLUCOSE 175* 176*      Radiology/Studies: CT ABDOMEN PELVIS WO CONTRAST  Result Date: 06/24/2021 CLINICAL DATA:  Abdominal pain, nonlocalized. Diarrhea loss of appetite for past few days. EXAM: CT ABDOMEN AND PELVIS WITHOUT CONTRAST TECHNIQUE: Multidetector CT imaging of the abdomen and pelvis was performed following the standard protocol without IV contrast. COMPARISON:  CT examination dated October 15, 2020 FINDINGS: Lower chest: No acute abnormality. Hepatobiliary: No focal liver abnormality is seen. Status post cholecystectomy. No biliary dilatation. Pancreas: Unremarkable. No pancreatic ductal dilatation or surrounding inflammatory changes. Spleen: Normal in size without focal abnormality. Adrenals/Urinary Tract: Adrenal glands are unremarkable. Kidneys are normal, without renal calculi, focal lesion, or hydronephrosis. Bladder is unremarkable. Stomach/Bowel: Stomach is within normal limits. Appendix not visualized. No evidence of bowel wall thickening, distention, or inflammatory changes. Colonic diverticulosis without evidence of acute diverticulitis. Vascular/Lymphatic: Aortic atherosclerosis. No enlarged abdominal or pelvic lymph nodes. IVC filter is noted. Reproductive: Status post hysterectomy. Stable hypodense cystic structures in bilateral adnexal region. Other: No abdominal wall hernia or abnormality. No abdominopelvic ascites. Musculoskeletal: Degenerate disc disease of the lumbar spine with mild levoscoliosis. No acute osseous abnormality. IMPRESSION: 1. Sigmoid colonic diverticulosis without evidence of acute diverticulitis. 2. Bowel loops are normal in caliber without evidence of obstruction or significant wall thickening. 3. Status post cholecystectomy and hysterectomy. 4. 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 IVC filter is present. Electronically Signed   By: Keane Police D.O.   On: 06/24/2021 16:17   DG Chest Portable 1 View  Result Date: 06/25/2021 CLINICAL DATA:  Chest and back pain with diarrhea. EXAM: PORTABLE CHEST 1 VIEW COMPARISON:  01/15/2021 FINDINGS: 0459 hours. Stable asymmetric elevation right hemidiaphragm. Diffuse interstitial opacity again noted with new areas of patchy airspace disease in the right upper lobe and medial right base. No substantial pleural effusion. Left-sided permanent pacemaker again noted. Telemetry leads overlie the chest. IMPRESSION: Interval development of patchy airspace disease in the right upper lobe and medial right base. Multifocal pneumonia concern. Electronically Signed   By: Misty Stanley M.D.   On: 06/25/2021 05:23   ECHOCARDIOGRAM COMPLETE  Result Date: 06/27/2021    ECHOCARDIOGRAM REPORT   Patient Name:  [name] Date of Exam: 06/26/2021 Medical Rec #:  [id number]        Height:       67.0 in Accession #:    [id number]       Weight:       192.0 lb Date of Birth:  January 13, 1946         BSA:          1.988 m Patient Age:    20 years         BP:           139/44 mmHg Patient Gender: F                HR:           87 bpm. Exam Location:  [facility] Procedure: 2D Echo, Cardiac Doppler and Color Doppler Indications:     R78.81 Bacteremia  History:         Patient has prior history of Echocardiogram examinations, most                  recent 09/12/2019. Cardiomyopathy, Stroke, Arrythmias:Atrial                  Fibrillation; Risk Factors:Hypertension, Dyslipidemia, Diabetes                  and Sleep Apnea.  Sonographer:     Cresenciano Lick RDCS Referring Phys:  [id number] Barb Merino Diagnosing Phys: Ida Rogue MD IMPRESSIONS  1. Unable to exclude valve endocarditis as detailed below.  2. Left ventricular ejection fraction, by estimation, is 60 to 65%. The left ventricle has normal function. The left ventricle has no regional wall motion abnormalities. There is mild left ventricular hypertrophy. Left ventricular diastolic  parameters are indeterminate.  3. Right ventricular systolic function is normal. The right ventricular size is normal. There is moderately elevated pulmonary artery systolic pressure. The estimated right ventricular systolic pressure is 02.7 mmHg.  4. The mitral valve is normal in structure. Mild to moderate mitral valve regurgitation. No evidence of mitral stenosis.  5. Tricuspid valve regurgitation is moderate. Thickening of the tricuspid leaflets, unable to exclude vegetation.  6. The aortic valve is normal in structure. There is moderate calcification of the aortic valve. There is moderate thickening of the aortic valve. Aortic valve regurgitation is not visualized. valve thickening appears worse compared to study in 2021, unable to exclude vegetation.  7. Aortic valve sclerosis/calcification is present, without any evidence of aortic stenosis.  8. The inferior vena cava is normal in size with greater than 50% respiratory variability, suggesting right atrial pressure of 3 mmHg. FINDINGS  Left Ventricle: Left ventricular ejection fraction, by estimation, is 60 to 65%. The left ventricle has normal function. The left ventricle has no regional wall motion abnormalities. The left ventricular internal cavity size was normal in size. There is  mild left ventricular hypertrophy. Left ventricular diastolic parameters are indeterminate. Right Ventricle: The right ventricular size is normal. No increase in right ventricular wall thickness. Right ventricular systolic function is normal. There is moderately elevated pulmonary artery systolic pressure. The tricuspid regurgitant velocity is 3.41 m/s, and with an assumed right atrial pressure of 5 mmHg, the estimated right ventricular systolic pressure is 25.3 mmHg. Left Atrium: Left atrial size was normal in size. Right Atrium: Right atrial size was normal in size. Pericardium: There is no evidence of pericardial effusion. Mitral Valve: The mitral valve is normal in  structure. Mild to moderate mitral valve  regurgitation. No evidence of mitral valve stenosis. Tricuspid Valve: The tricuspid valve is normal in structure. Tricuspid valve regurgitation is moderate . No evidence of tricuspid stenosis. Aortic Valve: The aortic valve is normal in structure. There is moderate calcification of the aortic valve. There is moderate thickening of the aortic valve. Aortic valve regurgitation is not visualized. Aortic valve sclerosis/calcification is present, without any evidence of aortic stenosis. Aortic valve mean gradient measures 10.5 mmHg. Aortic valve peak gradient measures 18.4 mmHg. Aortic valve area, by VTI measures 1.73 cm. Pulmonic Valve: The pulmonic valve was normal in structure. Pulmonic valve regurgitation is mild. No evidence of pulmonic stenosis. Aorta: The aortic root is normal in size and structure. Venous: The inferior vena cava is normal in size with greater than 50% respiratory variability, suggesting right atrial pressure of 3 mmHg. IAS/Shunts: No atrial level shunt detected by color flow Doppler. Additional Comments: A device lead is visualized.  LEFT VENTRICLE PLAX 2D LVIDd:         5.00 cm   Diastology LVIDs:         2.80 cm   LV e' medial:    9.25 cm/s LV PW:         1.20 cm   LV E/e' medial:  14.9 LV IVS:        1.20 cm   LV e' lateral:   7.83 cm/s LVOT diam:     1.90 cm   LV E/e' lateral: 17.6 LV SV:         62 LV SV Index:   31 LVOT Area:     2.84 cm  RIGHT VENTRICLE             IVC RV Basal diam:  3.80 cm     IVC diam: 1.80 cm RV S prime:     18.47 cm/s TAPSE (M-mode): 2.2 cm LEFT ATRIUM             Index        RIGHT ATRIUM           Index LA diam:        4.00 cm 2.01 cm/m   RA Area:     17.40 cm LA Vol (A2C):   75.5 ml 37.99 ml/m  RA Volume:   48.20 ml  24.25 ml/m LA Vol (A4C):   79.3 ml 39.90 ml/m LA Biplane Vol: 78.5 ml 39.50 ml/m  AORTIC VALVE AV Area (Vmax):    1.71 cm AV Area (Vmean):   1.72 cm AV Area (VTI):     1.73 cm AV Vmax:            214.50 cm/s AV Vmean:          152.250 cm/s AV VTI:            0.356 m AV Peak Grad:      18.4 mmHg AV Mean Grad:      10.5 mmHg LVOT Vmax:         129.00 cm/s LVOT Vmean:        92.350 cm/s LVOT VTI:          0.217 m LVOT/AV VTI ratio: 0.61  AORTA Ao Root diam: 2.80 cm Ao Asc diam:  3.50 cm MV E velocity: 137.67 cm/s  TRICUSPID VALVE                             TR Peak grad:   46.5 mmHg  TR Vmax:        341.00 cm/s                              SHUNTS                             Systemic VTI:  0.22 m                             Systemic Diam: 1.90 cm Ida Rogue MD Electronically signed by Ida Rogue MD Signature Date/Time: 06/27/2021/10:07:07 AM    Final    ECHO TEE  Result Date: 06/28/2021    TRANSESOPHOGEAL ECHO REPORT   Patient Name:   KEAIRRA BARDON Date of Exam: 06/28/2021 Medical Rec #:  [id number]        Height:       67.0 in Accession #:    [id number]       Weight:       192.0 lb Date of Birth:  [birth year]         BSA:          1.988 m Patient Age:    89 years         BP:           119/44 mmHg Patient Gender: F                HR:           71 bpm. Exam Location:  [facility] Procedure: Transesophageal Echo, Cardiac Doppler, Color Doppler and [place]ine            Contrast Bubble Study Indications:     Bacteremia R78.81  History:         Patient has prior history of Echocardiogram examinations, most                  recent 06/26/2021. Pacemaker, Arrythmias:Atrial Fibrillation;                  Risk Factors:Hypertension.  Sonographer:     Sherrie Sport Referring Phys:  [id number] [name] Diagnosing Phys: Ida Rogue MD PROCEDURE: After discussion of the risks and benefits of a TEE, an informed consent was obtained from the patient. TEE procedure time was 30 minutes. The transesophogeal probe was passed without difficulty through the esophogus of the patient. Imaged were obtained with the patient in a left lateral decubitus position. Local oropharyngeal anesthetic was provided with  Benzocaine spray and Cetacaine. Sedation performed by performing physician. Patients was under conscious sedation during this procedure.  Anesthetic administered: 18mcg of Fentanyl, 3mg  of Versed. Image quality was excellent. The patient's vital signs; including heart rate, blood pressure, and oxygen saturation; remained stable throughout the procedure. The patient developed no complications during the procedure. IMPRESSIONS  1. Large mobile vegetation on the RV pacing wire noted in the right atrium, with estimated to be 1 x 1.5 cm in size or larger.  2. Left ventricular ejection fraction, by estimation, is 55 to 60%. The left ventricle has normal function. The left ventricle has no regional wall motion abnormalities. There is mild left ventricular hypertrophy.  3. Right ventricular systolic function is normal. The right ventricular size is normal.  4. No left atrial/left atrial appendage thrombus was detected.  5. The mitral valve is normal in structure. Mild to moderate mitral  valve regurgitation. No evidence of mitral stenosis.  6. The aortic valve is normal in structure. There is moderate calcification of the aortic valve. Aortic valve regurgitation is not visualized. No aortic stenosis is present.  7. There is mild (Grade II) atheroma plaque involving the aortic arch and descending aorta.  8. Agitated saline contrast bubble study was negative, with no evidence of any interatrial shunt. Conclusion(s)/Recommendation(s): Normal biventricular function without evidence of hemodynamically significant valvular heart disease. FINDINGS  Left Ventricle: Left ventricular ejection fraction, by estimation, is 55 to 60%. The left ventricle has normal function. The left ventricle has no regional wall motion abnormalities. The left ventricular internal cavity size was normal in size. There is  mild left ventricular hypertrophy. Right Ventricle: The right ventricular size is normal. No increase in right ventricular wall  thickness. Right ventricular systolic function is normal. Left Atrium: Left atrial size was normal in size. No left atrial/left atrial appendage thrombus was detected. Right Atrium: Right atrial size was normal in size. Pericardium: There is no evidence of pericardial effusion. Mitral Valve: The mitral valve is normal in structure. Mild to moderate mitral valve regurgitation. No evidence of mitral valve stenosis. Tricuspid Valve: The tricuspid valve is normal in structure. Tricuspid valve regurgitation is not demonstrated. No evidence of tricuspid stenosis. Aortic Valve: The aortic valve is normal in structure. There is moderate calcification of the aortic valve. Aortic valve regurgitation is not visualized. No aortic stenosis is present. There is no evidence of aortic valve vegetation. Pulmonic Valve: The pulmonic valve was normal in structure. Pulmonic valve regurgitation is not visualized. No evidence of pulmonic stenosis. Aorta: The aortic root is normal in size and structure. There is mild (Grade II) atheroma plaque involving the aortic arch and descending aorta. IAS/Shunts: No atrial level shunt detected by color flow Doppler. Agitated saline contrast was given intravenously to evaluate for intracardiac shunting. Agitated saline contrast bubble study was negative, with no evidence of any interatrial shunt. Additional Comments: A device lead is visualized. Ida Rogue MD Electronically signed by Ida Rogue MD Signature Date/Time: 06/28/2021/5:38:15 PM    Final (Updated)    US Abdomen Limited RUQ (LIVER/GB)  Result Date: 06/26/2021 CLINICAL DATA:  Right upper quadrant pain. EXAM: ULTRASOUND ABDOMEN LIMITED RIGHT UPPER QUADRANT COMPARISON:  CT abdomen/pelvis 06/24/2021. FINDINGS: Gallbladder: Cholecystectomy. Common bile duct: Diameter: 5 mm, within normal limits. Liver: No focal lesion identified. Within normal limits in parenchymal echogenicity. Portal vein is patent on color Doppler imaging with normal  direction of blood flow towards the liver. IMPRESSION: Cholecystectomy.  No evidence of acute abnormality. Electronically Signed   By: Margaretha Sheffield M.D.   On: 06/26/2021 09:28    EKG: 06/29/2020 shows AF at 103 bpm (personally reviewed)  TELEMETRY: Atrial fibrillation 90-110s, occasional V pacing (personally reviewed)  DEVICE HISTORY: [employer] device implanted 12/2013 for bradycardia and chronotropic incompetence.   Assessment/Plan: 1. 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 - Large vegetation of 1 by 1.5 cm, may need to directly involve TCTS - ID following on ceftriaxone.    2. Acute on chronichypoxic respiratory failure with CAP - has underlying ILD. On home O2 - ? Septic embolization to lung -  ID following    3. Pulmonary hypertension:  Followed in the past at [facility], has been on triple therapy resumed with sildenafil, macitentan, and selexipeg.  - On digoxin for RV  support - Continue CPAP and home oxygen (4L)   4. PAF with tachy-brady syndrome - AF 90-110s currently  - amio stopped in 2019 due to underlying lung disease.  - Has been difficult to rate control this admission.  - Currently covered with heparin - Eliquis held with concerns for GiB requiring transfussion.    5. AKI due to ATN/shock - resolved - baseline Cr 1.03 - Cr peaked at 3.21 this admit -> currently 0.86   6. H/o DVT/[place] - resume [place] once procedures finished  7. Anemia ? For GI bleed Eliquis initially held, now on heparin.  Hgb 8.6   Dr Quentin Ore has seen. She is VERY high risk with her O2 requirement and PAH on triple therapy. Will need to discuss risk with TCTS in the event that needs to be converted to open.   For questions or updates, please contact [facility] Please consult [email or web address] for contact info under  Cardiology/STEMI.  Jacalyn Lefevre, PA-C  07/01/2021 7:10 AM

## 2021-07-01 NOTE — Progress Notes (Addendum)
Advanced Heart Failure Rounding Note   Subjective:    Remains in AF with rates 90s-100s up to 110s on amio gtt at 30/hr + diltiazem 30 mg q 6 hrs. Anticoagulated with heparin gtt.  Torsemide restarted yesterday. Diuresed well. Wt down 3 lb yesterday. Near baseline weight.   Denies dyspnea, orthopnea or PND. Notes some chest wall pain if coughs.   AF. Remains on IV abx.  Procalcitonin 18.79 > 12.96 > 5.8 > 0.61 > 0.55   Objective:   Weight Range:  Vital Signs:   Temp:  [97.4 F (36.3 C)-98.8 F (37.1 C)] 98.8 F (37.1 C) (01/09 0404) Pulse Rate:  [81-105] 94 (01/09 0404) Resp:  [18-28] 20 (01/09 0404) BP: (111-145)/(63-81) 111/81 (01/09 0404) SpO2:  [91 %-95 %] 95 % (01/09 0404) Weight:  [88.4 kg] 88.4 kg (01/09 0613) Last BM Date: 06/29/21  Weight change: Filed Weights   06/29/21 1600 06/30/21 0621 07/01/21 0613  Weight: 92.8 kg 89.8 kg 88.4 kg    Intake/Output:   Intake/Output Summary (Last 24 hours) at 07/01/2021 0733 Last data filed at 07/01/2021 0615 Gross per 24 hour  Intake 1696.18 ml  Output 2900 ml  Net -1203.82 ml     Physical Exam: General:  Lying comfortably in bed HEENT: normal Neck: supple. JVP difficult to assess. Carotids 2+ bilat; no bruits. No lymphadenopathy or thryomegaly appreciated. Cor: PMI nondisplaced. Irregular rhythm, tachy. No rubs, gallops or murmurs. Lungs: clear Abdomen: soft, nontender, nondistended, obese. No hepatosplenomegaly.  Extremities: no cyanosis, clubbing, rash, 1+ edema on left, trace on right (chronic L > R edema) Neuro: alert & orientedx3, cranial nerves grossly intact. moves all 4 extremities w/o difficulty. Affect pleasant   Telemetry: AF 90s-100s, up to 110s at times (personally reviewed)  Labs: Basic Metabolic Panel: Recent Labs  Lab 06/27/21 1749 06/28/21 0500 06/29/21 1254 06/30/21 0522 07/01/21 0011  NA 131* 134* 140 138 137  K 4.5 3.8 3.9 3.9 3.4*  CL 97* 100 96* 94* 95*  CO2 25 30 34* 34*  35*  GLUCOSE 257* 242* 226* 175* 176*  BUN 84* 76* 31* 19 17  CREATININE 1.68* 1.46* 0.96 0.80 0.86  CALCIUM 7.3* 7.5* 8.3* 8.1* 8.0*  MG  --   --   --   --  1.8    Liver Function Tests: Recent Labs  Lab 06/26/21 0847 06/28/21 0500 06/29/21 1254 06/29/21 1608 06/30/21 0522  AST 16 13* 14* 13* 11*  ALT 11 12 11 12 9   ALKPHOS 137* 123 126 122 99  BILITOT 1.9* 1.0 1.1 0.9 0.9  PROT 6.6 6.3* 6.4* 6.8 5.8*  ALBUMIN 2.3* 2.4* 2.3* 2.3* 2.0*   Recent Labs  Lab 06/24/21 1456  LIPASE 45   No results for input(s): AMMONIA in the last 168 hours.  CBC: Recent Labs  Lab 06/28/21 0500 06/29/21 0507 06/29/21 1254 06/30/21 0522 07/01/21 0011  WBC 9.3 15.8* 18.3* 20.6* 19.7*  NEUTROABS 7.3 11.9*  --   --   --   HGB 7.7* 8.0* 8.9* 8.0* 8.6*  HCT 23.4* 24.4* 27.1* 24.5* 27.8*  MCV 83.9 83.6 84.2 85.1 86.3  PLT 118* 135* 155 166 208    Cardiac Enzymes: No results for input(s): CKTOTAL, CKMB, CKMBINDEX, TROPONINI in the last 168 hours.  BNP: BNP (last 3 results) Recent Labs    08/24/20 1506 03/27/21 1530 06/25/21 1350  BNP 51.2 31.1 344.7*    ProBNP (last 3 results) No results for input(s): PROBNP in the last 8760 hours.  Other results:  Imaging: No results found.   Medications:     Scheduled Medications:  Difluprednate  1 drop Both Eyes BID   digoxin  0.125 mg Oral Daily   diltiazem  30 mg Oral Q6H   DULoxetine  60 mg Oral Daily   gabapentin  600 mg Oral TID   insulin aspart  0-9 Units Subcutaneous TID WC   macitentan  10 mg Oral Daily   pantoprazole (PROTONIX) IV  40 mg Intravenous Q24H   rosuvastatin  10 mg Oral Daily   Selexipag  800 mcg Oral BID   sildenafil  20 mg Oral TID   sodium chloride flush  3 mL Intravenous Q12H   spironolactone  25 mg Oral Daily   torsemide  40 mg Oral BID    Infusions:  sodium chloride     amiodarone 30 mg/hr (06/30/21 2313)   cefTRIAXone (ROCEPHIN)  IV 2 g (06/30/21 1250)   heparin 1,500 Units/hr (07/01/21  0215)   magnesium sulfate bolus IVPB      PRN Medications: sodium chloride, acetaminophen **OR** acetaminophen, albuterol, cholestyramine light, hydrALAZINE, HYDROcodone-acetaminophen, morphine injection, senna-docusate, sodium chloride flush   Assessment/Plan:   1. 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 - shock resolved - ID following  - Continue ceftriaxone - EP following for pacer extraction - tentatively today  2. Acute on chronichypoxic respiratory failure with CAP - has underlying ILD. On home O2 - ? Septic embolization to lung -  ID following  - Continue ceftriaxone   3. Pulmonary hypertension: She was [facility] the past [facility].  Based on 4/19 cath, she had severe PAH with PVR 7.2 WU. Chronic hypoxemic respiratory failure, on home oxygen.  She has a history of remote DVT (2004) but V/Q scan negative for evidence of chronic PE.  High resolution CT chest in 10/19 showed pulmonary fibrosis, and PFTs in 2017 showed severe restriction.  Serological workup for rheumatological diseases was unremarkable.  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 Amiodarone was stopped given lung parenchymal disease. 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  - PH meds resumed sildenafil 20 tid, macitentan 10 daily - Decision made to resume selexipeg at 800 bid (half PTA dose) given that she was off > 3 days. Tolerating well. Can likely increase to 1600 po bid prior to d/c - Continue digoxin for RV support - Continue CPAP and home oxygen.    4. PAF with tachy-brady syndrome - back in AF now - amio stopped in 2019 due to underlying lung disease.  - Failed rate control with oral cardizem. Amio gtt  added with improved rate control. If doesn't convert will need TEE/DC-CV. (Was off AFC for several days) - EP seeing regarding need for temp pacer with device removal - On apixaban PTA. Stopped due to concern over GIB on admit. Hgb now stable.  - Continue heparin until procedures complete then switch back to Eliquis   5. AKI due to ATN/shock - resolved - baseline Cr 1.03 - peak Scr  3.21 -> 0.86 today   6. Chronic diastolic HF - Volume looks okay. Back to baseline weight. - Continue Torsemide 40 mg BID (home dose) - Continue spiro 25 mg daily - consider SGLT2i prior to d/c  7. Severe HTN - BP stable. Back on home medications. - cover with PRN hydrlazine   8. H/o DVT/PE - On heparin gtt   9. DM2 - insulin. - cover with SSI    10. OSA on CPAP - continue CPAP  11. Hypokalemia/hypomagnesemia: - K 3.4/Mag 1.8 - Supp   Length of Stay: 2   FINCH, LINDSAY N MD 07/01/2021, 7:33 AM  Advanced Heart Failure Team Pager [phone] (M-F; 7a - 4p)  Please contact [facility] for night-coverage after hours (4p -7a ) and weekends on [email or web address]   Patient seen with PA, agree with the above note.   Good diuresis with her home torsemide yesterday, net negative 1204 cc. Creatinine stable 0.86.    Feels better overall.   General: NAD Neck: JVP 8 cm, no thyromegaly or thyroid nodule.  Lungs: Clear to auscultation bilaterally with normal respiratory effort. CV: Nondisplaced PMI.  Heart regular S1/S2, no S3/S4, no murmur.  Left leg lymphedema.  Abdomen: Soft, nontender, no hepatosplenomegaly, no distention.  Skin: Intact without lesions or rashes.  Neurologic: Alert and oriented x 3.  Psych: Normal affect. Extremities: No clubbing or cyanosis.  HEENT: Normal.   1. Endocarditis: RV pacing wire with vegetation on TEE.  Group G strep bacteremia.  Septic emboli to lungs.  - ID following.  - She is on ceftriaxone.  - Will need pacemaker extraction, apparently will be later this week.   ?Need for temp-perm PPM after extraction (h/o tachy-brady syndrome), will need discussion with EP.  - Will need pacemaker extraction 2. Acute on chronic hypoxemic respiratory failure: She now has PNA from septic emboli to lungs.  At baseline, on 4L home oxygen with CPAP at night for interstitial lung disease and OSA.   - Antibiotics as above for PNA.  - She is down to 4 L by [place].  3. Pulmonary hypertension: Based on 4/19 cath, she had severe PAH with PVR 7.2 WU. Chronic hypoxemic respiratory failure, on home oxygen.  She has a history of remote DVT (2004) but V/Q scan negative for evidence of chronic PE.  High resolution CT chest in 10/19 showed pulmonary fibrosis, and PFTs in 2017 showed severe restriction.  Serological workup for rheumatological diseases was unremarkable.  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 Amiodarone was stopped in past given lung parenchymal disease. 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  - She is back on sildenafil 20 tid and macitentan 10 daily.  - She had been off selexipag and was restarted at 800 mcg bid, home dose is 1600 bid.  Will gradually titrate up.  - Continue digoxin for RV support.  - Home oxygen, CPAP.  4. Atrial fibrillation with tachy-brady syndrome: She is currently in atrial fibrillation with controlled rate. Had been off amiodarone since 2019 due to interstitial lung disease/pulmonary fibrosis.  - Continue po diltiazem.  - Amiodarone gtt for now => use for rate control and plan short course once we get her back into NSR.  Would avoid long-term use.  - If she does not go back into NSR on her own, will need TEE-guided DCCV eventually.  Will need to discuss timing with EP.  Likely after device extraction as she will need to be continuously anticoagulated.  Will also need to take into account possible  bradycardia when she is back in NSR.  - Continue heparin gtt for now.  5. AKI: Resolved, creatinine 0.86.  6. 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 Volume status ok.  - Continue home torsemide 40 mg bid.  - Continue spironolactone 25 mg daily.  7. HTN: BP controlled. 8. H/o DVT: Anticoagulated.  9. Left leg lymphedema: Chronic.  10. OSA: CPAP.   [name] 07/01/2021 8:54 AM

## 2021-07-01 NOTE — Plan of Care (Signed)
  Problem: Education: Goal: Knowledge of disease or condition will improve Outcome: Progressing   Problem: Activity: Goal: Ability to tolerate increased activity will improve Outcome: Progressing   

## 2021-07-01 NOTE — Progress Notes (Signed)
ANTICOAGULATION CONSULT NOTE - Follow Up Consult  Pharmacy Consult for heparin Indication: atrial fibrillation  Labs: Recent Labs    06/29/21 1254 06/30/21 0522 06/30/21 1426 07/01/21 0011  HGB 8.9* 8.0*  --  8.6*  HCT 27.1* 24.5*  --  27.8*  PLT 155 166  --  208  APTT  --  30  --   --   HEPARINUNFRC  --  <0.10* <0.10* 0.19*  CREATININE 0.96 0.80  --  0.86    Assessment: 76yo female subtherapeutic on heparin after rate change though now approaching goal; no infusion issues or signs of bleeding per RN.  Goal of Therapy:  Heparin level 0.3-0.7 units/ml   Plan:  Will increase heparin infusion by 2-3 units/kg/hr to 1500 units/hr and check level in 8 hours.    Wynona Neat, PharmD, BCPS  07/01/2021,2:18 AM

## 2021-07-01 NOTE — Progress Notes (Signed)
PROGRESS NOTE    Debra Saunders  ERX:[id number] DOB: 1945/07/25 DOA: 06/29/2021 PCP: [name], MD   Brief Narrative: 76 year old with past medical history significant for hypertension, hyperlipidemia, diabetes, depression, subdural hematoma 2014, sinoatrial node dysfunction status post pacemaker, OSA, PAH, interstitial lung disease on chronic 4 L of oxygen, diastolic heart failure, A. fib on Eliquis, CKD stage IIIa, cardiorenal syndrome, DVT presents to [facility] ER on 06/25/2021 with shortness of breath, cough fevers chills dysuria and right upper quadrant pain.  Patient admitted with severe sepsis with septic shock secondary to multifocal pneumonia question septic emboli and strep bacteremia, Klebsiella UTI.  She required Levophed and stress dose steroids for a short time.  TEE 1/6 consistent with infected pacemaker lead with vegetation.  Patient also require 1 unit of packed red blood cell for hemoglobin of 7, evaluated by GI, no plans for scope.  She also had acute on chronic renal failure which has improved.  Shock liver from sepsis which has improved. Patient was transferred to [facility] 06/29/2021 for pacer extraction.  Patient developed A. fib with RVR, she was a started on amiodarone.     Assessment & Plan:   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  Active Problems:   Hypertension   Diabetes ([place])   Pulmonary hypertension, unspecified (HCC)   Hyperlipidemia   OSA (obstructive sleep apnea)   Acute on chronic respiratory failure with hypoxia (HCC)   Atrial fibrillation with tachy brady syndrome    Chronic diastolic CHF (congestive heart failure) (HCC)   Acute renal failure superimposed on stage 3a chronic kidney disease (HCC)   DVT (deep venous thrombosis) (HCC)   Acute blood loss anemia   klebsiella pneumoniae UTI   Pressure injury of skin   1-Sever Sepsis, Septic Shock secondary to infected  pacemaker, vegetation pacer wire, infective endocarditis.  Streptococcus bacteremia, multifocal pneumonia: -POA. Blood culture 1/03: Grew  Streptococcus group G -TEE: Large mobile vegetation on the RV pacing wire noted in the right  atrium, with estimated to be 1 x 1.5 cm in size or larger.  -Shock resolved, patient received IV Levophed and IV fluids. -Blood culture: 1/03 no growth to date.  -ID and cardiology following. -EP consulted for pacemaker lead extraction with may or may not require the assistant of CVTS as he is high risk for surgery. -No timing for procedure yet. -Continue with IV ceftriaxone  2-A. fib RVR, history of tachybradycardia syndrome status post pacemaker placement: -Continue Cardizem and digoxin.  Started on IV amiodarone 1/8 due to A fib RVR>  -On heparin drip--incision to Eliquis when okay with cardiology/EP  3-Pulmonary Hypertension/ILD?Acute on chronic hypoxic respiratory failure On Sildenafil/macitentan/selexipag, these medications were held briefly due to shock and hypotension initially. -Initially required BiPAP -Currently on chronic 4 L of oxygen   Chronic diastolic heart failure: Appreciate HF team.  Continue with torsemide, spironolactone.   AKI on CKD stage II Cr peak to 3.2 Cr trending down to 0.8 Prior cr range: 1.0-1.1 Monitor on diuretics.   Anemia of chronic disease: -Post 1 unit of packed red blood cell -FOBT negative -hb stable 8.6  History of DVT status post IVC filter 2015  Diet-controlled diabetes type 2 SGLT2 inhibitor prior to discharge per cardiology  History of subdural hematoma 2014  History of chronic diarrhea, related to medications for pulmonary hypertension -On cholestyramine PRN No BM since admission, will order one dose of senna Obesity/OSA on CPAP BMI 38.6  See below nursing skin assessment:    Pressure Injury 06/29/21 Right Stage 1 -  Intact skin with non-blanchable redness of a localized area usually over a  bony prominence. abrasion on right gluteal fold (Active)  06/29/21 1500  Location:   Location Orientation: Right  Staging: Stage 1 -  Intact skin with non-blanchable redness of a localized area usually over a bony prominence.  Wound Description (Comments): abrasion on right gluteal fold  Present on Admission: Yes      Estimated body mass index is 38.06 kg/m as calculated from the following:   Height as of this encounter: 5' (1.524 m).   Weight as of this encounter: 88.4 kg.   DVT prophylaxis: heparin GTT Code Status: Full code Family Communication: care discussed with patient.  Disposition Plan:  Status is: Inpatient  Remains inpatient appropriate because: needs treatment for endocarditis.     Consultants:  Cardiology  EP ID  Procedures:  TEE:   1. Large mobile vegetation on the RV pacing wire noted in the right  atrium, with estimated to be 1 x 1.5 cm in size or larger.   2. Left ventricular ejection fraction, by estimation, is 55 to 60%. The  left ventricle has normal function. The left ventricle has no regional  wall motion abnormalities. There is mild left ventricular hypertrophy.   3. Right ventricular systolic function is normal. The right ventricular  size is normal.   4. No left atrial/left atrial appendage thrombus was detected.   5. The mitral valve is normal in structure. Mild to moderate mitral valve  regurgitation. No evidence of mitral stenosis.   6. The aortic valve is normal in structure. There is moderate  calcification of the aortic valve. Aortic valve regurgitation is not  visualized. No aortic stenosis is present.   7. There is mild (Grade II) atheroma plaque involving the aortic arch and  descending aorta.   8. Agitated saline contrast bubble study was negative, with no evidence  of any interatrial shunt.   Antimicrobials:  Ceftriaxone.   Subjective: She was still using CPAP this am, she was waking up. Denies worsening dyspnea.  No BM  since admission.   Objective: Vitals:   06/30/21 1937 06/30/21 2311 07/01/21 0404 07/01/21 0613  BP: (!) 145/63 140/77 111/81   Pulse: (!) 105 81 94   Resp: 18 20 20    Temp: (!) 97.4 F (36.3 C) 98.7 F (37.1 C) 98.8 F (37.1 C)   TempSrc: Oral Oral Oral   SpO2: 95% 94% 95%   Weight:    88.4 kg  Height:        Intake/Output Summary (Last 24 hours) at 07/01/2021 0717 Last data filed at 07/01/2021 0615 Gross per 24 hour  Intake 1696.18 ml  Output 2900 ml  Net -1203.82 ml   Filed Weights   06/29/21 1600 06/30/21 0621 07/01/21 0613  Weight: 92.8 kg 89.8 kg 88.4 kg    Examination:  General exam: Appears calm and comfortable  Respiratory system: BL air movement no wheezing.  Cardiovascular system: S1 & S2 heard, IRR,  Gastrointestinal system: Abdomen is nondistended, soft and nontender. No organomegaly or masses felt. Normal bowel sounds heard. Central nervous system: Alert and oriented.  Extremities: Symmetric 5 x 5 power.   Data Reviewed: I have personally reviewed following labs and imaging studies  CBC: Recent Labs  Lab 06/28/21 0500 06/29/21 0507 06/29/21 1254 06/30/21 0522 07/01/21 0011  WBC 9.3 15.8* 18.3* 20.6* 19.7*  NEUTROABS 7.3 11.9*  --   --   --  HGB 7.7* 8.0* 8.9* 8.0* 8.6*  HCT 23.4* 24.4* 27.1* 24.5* 27.8*  MCV 83.9 83.6 84.2 85.1 86.3  PLT 118* 135* 155 166 510   Basic Metabolic Panel: Recent Labs  Lab 06/27/21 1749 06/28/21 0500 06/29/21 1254 06/30/21 0522 07/01/21 0011  NA 131* 134* 140 138 137  K 4.5 3.8 3.9 3.9 3.4*  CL 97* 100 96* 94* 95*  CO2 25 30 34* 34* 35*  GLUCOSE 257* 242* 226* 175* 176*  BUN 84* 76* 31* 19 17  CREATININE 1.68* 1.46* 0.96 0.80 0.86  CALCIUM 7.3* 7.5* 8.3* 8.1* 8.0*  MG  --   --   --   --  1.8   GFR: Estimated Creatinine Clearance: 53.4 mL/min (by C-G formula based on SCr of 0.86 mg/dL). Liver Function Tests: Recent Labs  Lab 06/26/21 0847 06/28/21 0500 06/29/21 1254 06/29/21 1608 06/30/21 0522   AST 16 13* 14* 13* 11*  ALT 11 12 11 12 9   ALKPHOS 137* 123 126 122 99  BILITOT 1.9* 1.0 1.1 0.9 0.9  PROT 6.6 6.3* 6.4* 6.8 5.8*  ALBUMIN 2.3* 2.4* 2.3* 2.3* 2.0*   Recent Labs  Lab 06/24/21 1456  LIPASE 45   No results for input(s): AMMONIA in the last 168 hours. Coagulation Profile: Recent Labs  Lab 06/25/21 0258  INR 2.1*   Cardiac Enzymes: No results for input(s): CKTOTAL, CKMB, CKMBINDEX, TROPONINI in the last 168 hours. BNP (last 3 results) No results for input(s): PROBNP in the last 8760 hours. HbA1C: No results for input(s): HGBA1C in the last 72 hours. CBG: Recent Labs  Lab 06/30/21 0617 06/30/21 1134 06/30/21 1626 06/30/21 2115 07/01/21 0611  GLUCAP 162* 187* 240* 172* 182*   Lipid Profile: No results for input(s): CHOL, HDL, LDLCALC, TRIG, CHOLHDL, LDLDIRECT in the last 72 hours. Thyroid Function Tests: Recent Labs    06/29/21 1254  TSH 0.054*   Anemia Panel: No results for input(s): VITAMINB12, FOLATE, FERRITIN, TIBC, IRON, RETICCTPCT in the last 72 hours. Sepsis Labs: Recent Labs  Lab 06/25/21 0433 06/25/21 1350 06/26/21 0847 06/27/21 0828 06/29/21 1254 06/30/21 0522 07/01/21 0011  PROCALCITON  --   --    < > 5.80 0.59 0.61 0.55  LATICACIDVEN 1.0 1.0  --   --   --   --   --    < > = values in this interval not displayed.    Recent Results (from the past 240 hour(s))  Blood culture (routine x 2)     Status: Abnormal   Collection Time: 06/25/21  2:33 AM   Specimen: BLOOD RIGHT FOREARM  Result Value Ref Range Status   Specimen Description   Final    BLOOD RIGHT FOREARM Performed at [facility], [address], [place], [place] [address]    Special Requests   Final    BOTTLES DRAWN AEROBIC AND ANAEROBIC Blood Culture results may not be optimal due to an inadequate volume of blood received in culture bottles Performed at [facility], [address], [place], [place] [address]    Culture  Setup Time   Final     GRAM POSITIVE COCCI IN BOTH AEROBIC AND ANAEROBIC BOTTLES Organism ID to follow CRITICAL RESULT CALLED TO, READ BACK BY AND VERIFIED WITHArdeen Garland MPNTIR [id number] 06/25/21 HNM Performed at [facility], [address], [place], [place] [address]    Culture STREPTOCOCCUS GROUP G (A)  Final   Report Status 06/28/2021 FINAL  Final   Organism ID, Bacteria  STREPTOCOCCUS GROUP G  Final      Susceptibility   Streptococcus group g - MIC*    CLINDAMYCIN RESISTANT Resistant     AMPICILLIN <=0.25 SENSITIVE Sensitive     ERYTHROMYCIN 2 RESISTANT Resistant     VANCOMYCIN 0.5 SENSITIVE Sensitive     CEFTRIAXONE <=0.12 SENSITIVE Sensitive     LEVOFLOXACIN 0.5 SENSITIVE Sensitive     PENICILLIN Value in next row Sensitive      SENSITIVE0.06    * STREPTOCOCCUS GROUP G  Blood Culture ID Panel (Reflexed)     Status: Abnormal   Collection Time: 06/25/21  2:33 AM  Result Value Ref Range Status   Enterococcus faecalis NOT DETECTED NOT DETECTED Final   Enterococcus Faecium NOT DETECTED NOT DETECTED Final   Listeria monocytogenes NOT DETECTED NOT DETECTED Final   Staphylococcus species NOT DETECTED NOT DETECTED Final   Staphylococcus aureus (BCID) NOT DETECTED NOT DETECTED Final   Staphylococcus epidermidis NOT DETECTED NOT DETECTED Final   Staphylococcus lugdunensis NOT DETECTED NOT DETECTED Final   Streptococcus species DETECTED (A) NOT DETECTED Final    Comment: Not Enterococcus species, Streptococcus agalactiae, Streptococcus pyogenes, or Streptococcus pneumoniae. CRITICAL RESULT CALLED TO, READ BACK BY AND VERIFIED WITH: [place] [id number] 06/25/21 HNM    Streptococcus agalactiae NOT DETECTED NOT DETECTED Final   Streptococcus pneumoniae NOT DETECTED NOT DETECTED Final   Streptococcus pyogenes NOT DETECTED NOT DETECTED Final   A.calcoaceticus-baumannii NOT DETECTED NOT DETECTED Final   Bacteroides fragilis NOT DETECTED NOT DETECTED Final   Enterobacterales NOT DETECTED NOT DETECTED  Final   Enterobacter cloacae complex NOT DETECTED NOT DETECTED Final   Escherichia coli NOT DETECTED NOT DETECTED Final   Klebsiella aerogenes NOT DETECTED NOT DETECTED Final   Klebsiella oxytoca NOT DETECTED NOT DETECTED Final   Klebsiella pneumoniae NOT DETECTED NOT DETECTED Final   Proteus species NOT DETECTED NOT DETECTED Final   Salmonella species NOT DETECTED NOT DETECTED Final   Serratia marcescens NOT DETECTED NOT DETECTED Final   Haemophilus influenzae NOT DETECTED NOT DETECTED Final   Neisseria meningitidis NOT DETECTED NOT DETECTED Final   Pseudomonas aeruginosa NOT DETECTED NOT DETECTED Final   Stenotrophomonas maltophilia NOT DETECTED NOT DETECTED Final   Candida albicans NOT DETECTED NOT DETECTED Final   Candida auris NOT DETECTED NOT DETECTED Final   Candida glabrata NOT DETECTED NOT DETECTED Final   Candida krusei NOT DETECTED NOT DETECTED Final   Candida parapsilosis NOT DETECTED NOT DETECTED Final   Candida tropicalis NOT DETECTED NOT DETECTED Final   Cryptococcus neoformans/gattii NOT DETECTED NOT DETECTED Final    Comment: Performed at [facility], [address] [place], [place] [address]  Resp Panel by RT-PCR (Flu A&B, Covid) Nasopharyngeal Swab     Status: None   Collection Time: 06/25/21  3:33 AM   Specimen: Nasopharyngeal Swab; Nasopharyngeal(NP) swabs in vial transport medium  Result Value Ref Range Status   SARS Coronavirus 2 by RT PCR NEGATIVE NEGATIVE Final    Comment: (NOTE) SARS-CoV-2 target nucleic acids are NOT DETECTED.  The SARS-CoV-2 RNA is generally detectable in upper respiratory specimens during the acute phase of infection. The lowest concentration of SARS-CoV-2 viral copies this assay can detect is 138 copies/mL. A negative result does not preclude SARS-Cov-2 infection and should not be used as the sole basis for treatment or other patient management decisions. A negative result may occur with  improper specimen  collection/handling, submission of specimen other than nasopharyngeal swab, presence of viral mutation(s) within  the areas targeted by this assay, and inadequate number of viral copies(<138 copies/mL). A negative result must be combined with clinical observations, patient history, and epidemiological information. The expected result is Negative.  Fact Sheet for Patients:  EntrepreneurPulse.com.au  Fact Sheet for Healthcare Providers:  IncredibleEmployment.be  This test is no t yet approved or cleared by the [place] FDA and  has been authorized for detection and/or diagnosis of SARS-CoV-2 by FDA under an Emergency Use Authorization (EUA). This EUA will remain  in effect (meaning this test can be used) for the duration of the COVID-19 declaration under Section 564(b)(1) of the Act, 21 U.S.C.section 360bbb-3(b)(1), unless the authorization is terminated  or revoked sooner.       Influenza A by PCR NEGATIVE NEGATIVE Final   Influenza B by PCR NEGATIVE NEGATIVE Final    Comment: (NOTE) The Xpert Xpress SARS-CoV-2/FLU/RSV plus assay is intended as an aid in the diagnosis of influenza from Nasopharyngeal swab specimens and should not be used as a sole basis for treatment. Nasal washings and aspirates are unacceptable for Xpert Xpress SARS-CoV-2/FLU/RSV testing.  Fact Sheet for Patients: EntrepreneurPulse.com.au  Fact Sheet for Healthcare Providers: IncredibleEmployment.be  This test is not yet approved or cleared by the [place] FDA and has been authorized for detection and/or diagnosis of SARS-CoV-2 by FDA under an Emergency Use Authorization (EUA). This EUA will remain in effect (meaning this test can be used) for the duration of the COVID-19 declaration under Section 564(b)(1) of the Act, 21 U.S.C. section 360bbb-3(b)(1), unless the authorization is terminated or revoked.  Performed at [facility], [address] [place], [place] [address]   Blood culture (routine x 2)     Status: Abnormal   Collection Time: 06/25/21  5:55 AM   Specimen: BLOOD  Result Value Ref Range Status   Specimen Description   Final    BLOOD LEFT ARM Performed at [facility], [address] [place], [place] [address]    Special Requests   Final    BOTTLES DRAWN AEROBIC AND ANAEROBIC Blood Culture adequate volume Performed at [facility], [address], [place], [place] [address]    Culture  Setup Time   Final    GRAM POSITIVE COCCI IN BOTH AEROBIC AND ANAEROBIC BOTTLES CRITICAL VALUE NOTED.  VALUE IS CONSISTENT WITH PREVIOUSLY REPORTED AND CALLED VALUE. Performed at [facility], [address] [place], [place] [address]    Culture (A)  Final    STREPTOCOCCUS GROUP G SUSCEPTIBILITIES PERFORMED ON PREVIOUS CULTURE WITHIN THE LAST 5 DAYS. Performed at [facility], [place] [address], [place], [place] [address]    Report Status 06/28/2021 FINAL  Final  Urine Culture     Status: Abnormal   Collection Time: 06/25/21  6:55 AM   Specimen: Urine, Clean Catch  Result Value Ref Range Status   Specimen Description   Final    URINE, CLEAN CATCH Performed at [facility], [address] [place], [place] [address]    Special Requests   Final    NONE Performed at [facility], [address] [place], [place] [address]    Culture >=100,000 COLONIES/mL KLEBSIELLA PNEUMONIAE (A)  Final   Report Status 06/27/2021 FINAL  Final   Organism ID, Bacteria KLEBSIELLA PNEUMONIAE (A)  Final      Susceptibility   Klebsiella pneumoniae - MIC*    AMPICILLIN RESISTANT Resistant     CEFAZOLIN <=4 SENSITIVE Sensitive  CEFEPIME <=0.12 SENSITIVE Sensitive     CEFTRIAXONE <=0.25 SENSITIVE Sensitive     CIPROFLOXACIN <=0.25 SENSITIVE Sensitive     GENTAMICIN <=1 SENSITIVE Sensitive     IMIPENEM <=0.25 SENSITIVE Sensitive     NITROFURANTOIN 32  SENSITIVE Sensitive     TRIMETH/SULFA <=20 SENSITIVE Sensitive     AMPICILLIN/SULBACTAM <=2 SENSITIVE Sensitive     PIP/TAZO <=4 SENSITIVE Sensitive     * >=100,000 COLONIES/mL KLEBSIELLA PNEUMONIAE  C Difficile Quick Screen w PCR reflex     Status: None   Collection Time: 06/25/21  6:06 PM   Specimen: Stool  Result Value Ref Range Status   C Diff antigen NEGATIVE NEGATIVE Final   C Diff toxin NEGATIVE NEGATIVE Final   C Diff interpretation No C. difficile detected.  Final    Comment: Performed at [facility], [address] [place], [place] [address]  MRSA Next Gen by PCR, Nasal     Status: None   Collection Time: 06/25/21 10:55 PM   Specimen: Nasal Mucosa; Nasal Swab  Result Value Ref Range Status   MRSA by PCR Next Gen NOT DETECTED NOT DETECTED Final    Comment: (NOTE) [name] (FDA approved for NASAL specimens only), is one component of a comprehensive MRSA colonization surveillance program. It is not intended to diagnose MRSA infection nor to guide or monitor treatment for MRSA infections. Test performance is not FDA approved in patients less than 7 years old. Performed at [facility], [address] [place], [place] [address]   Culture, blood (Routine X 2) w Reflex to ID Panel     Status: None (Preliminary result)   Collection Time: 06/27/21  8:29 AM   Specimen: BLOOD RIGHT HAND  Result Value Ref Range Status   Specimen Description BLOOD RIGHT HAND  Final   Special Requests   Final    BOTTLES DRAWN AEROBIC AND ANAEROBIC Blood Culture adequate volume   Culture   Final    NO GROWTH 4 DAYS Performed at [facility], [address], [place], [place] [address]    Report Status PENDING  Incomplete  Culture, blood (Routine X 2) w Reflex to ID Panel     Status: None (Preliminary result)   Collection Time: 06/27/21  8:29 AM   Specimen: BLOOD RIGHT HAND  Result Value Ref Range Status   Specimen Description BLOOD RIGHT HAND   Final   Special Requests   Final    BOTTLES DRAWN AEROBIC ONLY Blood Culture adequate volume   Culture   Final    NO GROWTH 4 DAYS Performed at [facility], [address], [place], [place] [address]    Report Status PENDING  Incomplete         Radiology Studies: No results found.      Scheduled Meds:  Difluprednate  1 drop Both Eyes BID   digoxin  0.125 mg Oral Daily   diltiazem  30 mg Oral Q6H   DULoxetine  60 mg Oral Daily   gabapentin  600 mg Oral TID   insulin aspart  0-9 Units Subcutaneous TID WC   macitentan  10 mg Oral Daily   pantoprazole (PROTONIX) IV  40 mg Intravenous Q24H   rosuvastatin  10 mg Oral Daily   Selexipag  800 mcg Oral BID   sildenafil  20 mg Oral TID   sodium chloride flush  3 mL Intravenous Q12H   spironolactone  25 mg Oral Daily   torsemide  40 mg Oral BID   Continuous Infusions:  sodium chloride     amiodarone 30 mg/hr (06/30/21 2313)   cefTRIAXone (ROCEPHIN)  IV 2 g (06/30/21 1250)   heparin 1,500 Units/hr (07/01/21 0215)     LOS: 2 days    Time spent: 35 minutes.     Elmarie Shiley, MD [facility]   If 7PM-7AM, please contact night-coverage [email or web address]  07/01/2021, 7:17 AM

## 2021-07-01 NOTE — Progress Notes (Signed)
[place]  Date of Admission:  06/29/2021     Total days of antibiotics 7         ASSESSMENT:  Debra Saunders blood cultures from 06/27/21 have remained without growth to date.  Electrophysiology has been consulted for pacemaker/lead extraction which may or may not require the assistance of CVTS as she is high risk for surgery. Appears to be tolerating ceftriaxone with no adverse side effects. Continue ceftriaxone and monitor blood cultures for continued clearance of bacteremia. Remaining medical and supportive care per primary team.   PLAN:  Continue current dose of ceftriaxone.  Await surgical recommendations for removal of pacemaker/leads Monitor cultures for clearance of bacteremia.  Remaining medical and supportive care per primary team.   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  Active Problems:   Hypertension   Diabetes ([place])   Pulmonary hypertension, unspecified (HCC)   Hyperlipidemia   OSA (obstructive sleep apnea)   Acute on chronic respiratory failure with hypoxia (HCC)   Atrial fibrillation with tachy brady syndrome    Chronic diastolic CHF (congestive heart failure) (HCC)   Acute renal failure superimposed on stage 3a chronic kidney disease (HCC)   DVT (deep venous thrombosis) (HCC)   Acute blood loss anemia   klebsiella pneumoniae UTI   Pressure injury of skin    Difluprednate  1 drop Both Eyes BID   digoxin  0.125 mg Oral Daily   diltiazem  30 mg Oral Q6H   DULoxetine  60 mg Oral Daily   gabapentin  600 mg Oral TID   insulin aspart  0-9 Units Subcutaneous TID WC   macitentan  10 mg Oral Daily   pantoprazole (PROTONIX) IV  40 mg Intravenous Q24H   polyethylene glycol  17 g Oral BID   potassium chloride  40 mEq Oral BID   rosuvastatin  10 mg Oral Daily   Selexipag  1,000 mcg Oral BID   sildenafil  20 mg Oral TID   sodium chloride flush  3 mL Intravenous  Q12H   spironolactone  25 mg Oral Daily   torsemide  40 mg Oral BID    SUBJECTIVE:  Afebrile overnight with no acute events. On CPAP.  Allergies  Allergen Reactions   Aspirin Hives and Other (See Comments)    Pt states that she is unable to take because she had bleeding in her brain   Other Other (See Comments)    Pt states that she is unable to take blood thinners because she had bleeding in her brain; Blood Thinners-per doctor at [facility] (Xarelto)   Penicillins Hives, Shortness Of Breath, Swelling and Other (See Comments)    Has patient had a PCN reaction causing immediate rash, facial/tongue/throat swelling, SOB or lightheadedness with hypotension: Yes Has patient had a PCN reaction causing severe rash involving mucus membranes or skin necrosis: No Has patient had a PCN reaction that required hospitalization No Has patient had a PCN reaction occurring within the last 10 years: No If all of the above answers are "NO", then may proceed with Cephalosporin use.     Review of Systems: Review of Systems  Constitutional:  Negative for chills, fever and weight loss.  Respiratory:  Negative for cough, shortness of breath and wheezing.   Cardiovascular:  Negative for chest pain and leg swelling.  Gastrointestinal:  Negative for abdominal pain, constipation, diarrhea, nausea and vomiting.  Skin:  Negative for rash.  OBJECTIVE: Vitals:   06/30/21 2311 07/01/21 0404 07/01/21 0613 07/01/21 0736  BP: 140/77 111/81  132/75  Pulse: 81 94  90  Resp: 20 20  (!) 21  Temp: 98.7 F (37.1 C) 98.8 F (37.1 C)  98.8 F (37.1 C)  TempSrc: Oral Oral    SpO2: 94% 95%  91%  Weight:   88.4 kg   Height:       Body mass index is 38.06 kg/m.  Physical Exam Constitutional:      General: She is not in acute distress.    Appearance: She is well-developed.  Cardiovascular:     Rate and Rhythm: Tachycardia present. Rhythm irregular.     Heart sounds: Normal heart sounds.  Pulmonary:      Effort: Pulmonary effort is normal.     Breath sounds: Normal breath sounds.  Skin:    General: Skin is warm and dry.  Neurological:     Mental Status: She is alert and oriented to person, place, and time.    Lab Results Lab Results  Component Value Date   WBC 19.7 (H) 07/01/2021   HGB 8.6 (L) 07/01/2021   HCT 27.8 (L) 07/01/2021   MCV 86.3 07/01/2021   PLT 208 07/01/2021    Lab Results  Component Value Date   CREATININE 0.86 07/01/2021   BUN 17 07/01/2021   NA 137 07/01/2021   K 3.4 (L) 07/01/2021   CL 95 (L) 07/01/2021   CO2 35 (H) 07/01/2021    Lab Results  Component Value Date   ALT 9 06/30/2021   AST 11 (L) 06/30/2021   ALKPHOS 99 06/30/2021   BILITOT 0.9 06/30/2021     Microbiology: Recent Results (from the past 240 hour(s))  Blood culture (routine x 2)     Status: Abnormal   Collection Time: 06/25/21  2:33 AM   Specimen: BLOOD RIGHT FOREARM  Result Value Ref Range Status   Specimen Description   Final    BLOOD RIGHT FOREARM Performed at [facility], [address], [place], [place] [address]    Special Requests   Final    BOTTLES DRAWN AEROBIC AND ANAEROBIC Blood Culture results may not be optimal due to an inadequate volume of blood received in culture bottles Performed at [facility], [address] [place], [place] [address]    Culture  Setup Time   Final    GRAM POSITIVE COCCI IN BOTH AEROBIC AND ANAEROBIC BOTTLES Organism ID to follow CRITICAL RESULT CALLED TO, READ BACK BY AND VERIFIED WITHArdeen Garland PHARMD [id number] 06/25/21 HNM Performed at [facility], [address] [place], [place] [address]    Culture STREPTOCOCCUS GROUP G (A)  Final   Report Status 06/28/2021 FINAL  Final   Organism ID, Bacteria STREPTOCOCCUS GROUP G  Final      Susceptibility   Streptococcus group g - MIC*    CLINDAMYCIN RESISTANT Resistant     AMPICILLIN <=0.25 SENSITIVE Sensitive     ERYTHROMYCIN 2 RESISTANT Resistant     VANCOMYCIN  0.5 SENSITIVE Sensitive     CEFTRIAXONE <=0.12 SENSITIVE Sensitive     LEVOFLOXACIN 0.5 SENSITIVE Sensitive     PENICILLIN Value in next row Sensitive      SENSITIVE0.06    * STREPTOCOCCUS GROUP G  Blood Culture ID Panel (Reflexed)     Status: Abnormal   Collection Time: 06/25/21  2:33 AM  Result Value Ref Range Status   Enterococcus faecalis NOT DETECTED NOT DETECTED Final  Enterococcus Faecium NOT DETECTED NOT DETECTED Final   Listeria monocytogenes NOT DETECTED NOT DETECTED Final   Staphylococcus species NOT DETECTED NOT DETECTED Final   Staphylococcus aureus (BCID) NOT DETECTED NOT DETECTED Final   Staphylococcus epidermidis NOT DETECTED NOT DETECTED Final   Staphylococcus lugdunensis NOT DETECTED NOT DETECTED Final   Streptococcus species DETECTED (A) NOT DETECTED Final    Comment: Not Enterococcus species, Streptococcus agalactiae, Streptococcus pyogenes, or Streptococcus pneumoniae. CRITICAL RESULT CALLED TO, READ BACK BY AND VERIFIED WITH: Hughes [id number] 06/25/21 HNM    Streptococcus agalactiae NOT DETECTED NOT DETECTED Final   Streptococcus pneumoniae NOT DETECTED NOT DETECTED Final   Streptococcus pyogenes NOT DETECTED NOT DETECTED Final   A.calcoaceticus-baumannii NOT DETECTED NOT DETECTED Final   Bacteroides fragilis NOT DETECTED NOT DETECTED Final   Enterobacterales NOT DETECTED NOT DETECTED Final   Enterobacter cloacae complex NOT DETECTED NOT DETECTED Final   Escherichia coli NOT DETECTED NOT DETECTED Final   Klebsiella aerogenes NOT DETECTED NOT DETECTED Final   Klebsiella oxytoca NOT DETECTED NOT DETECTED Final   Klebsiella pneumoniae NOT DETECTED NOT DETECTED Final   Proteus species NOT DETECTED NOT DETECTED Final   Salmonella species NOT DETECTED NOT DETECTED Final   Serratia marcescens NOT DETECTED NOT DETECTED Final   Haemophilus influenzae NOT DETECTED NOT DETECTED Final   Neisseria meningitidis NOT DETECTED NOT DETECTED Final   Pseudomonas  aeruginosa NOT DETECTED NOT DETECTED Final   Stenotrophomonas maltophilia NOT DETECTED NOT DETECTED Final   Candida albicans NOT DETECTED NOT DETECTED Final   Candida auris NOT DETECTED NOT DETECTED Final   Candida glabrata NOT DETECTED NOT DETECTED Final   Candida krusei NOT DETECTED NOT DETECTED Final   Candida parapsilosis NOT DETECTED NOT DETECTED Final   Candida tropicalis NOT DETECTED NOT DETECTED Final   Cryptococcus neoformans/gattii NOT DETECTED NOT DETECTED Final    Comment: Performed at [facility], [address] [place], [place] [address]  Resp Panel by RT-PCR (Flu A&B, Covid) Nasopharyngeal Swab     Status: None   Collection Time: 06/25/21  3:33 AM   Specimen: Nasopharyngeal Swab; Nasopharyngeal(NP) swabs in vial transport medium  Result Value Ref Range Status   SARS Coronavirus 2 by RT PCR NEGATIVE NEGATIVE Final    Comment: (NOTE) SARS-CoV-2 target nucleic acids are NOT DETECTED.  The SARS-CoV-2 RNA is generally detectable in upper respiratory specimens during the acute phase of infection. The lowest concentration of SARS-CoV-2 viral copies this assay can detect is 138 copies/mL. A negative result does not preclude SARS-Cov-2 infection and should not be used as the sole basis for treatment or other patient management decisions. A negative result may occur with  improper specimen collection/handling, submission of specimen other than nasopharyngeal swab, presence of viral mutation(s) within the areas targeted by this assay, and inadequate number of viral copies(<138 copies/mL). A negative result must be combined with clinical observations, patient history, and epidemiological information. The expected result is Negative.  Fact Sheet for Patients:  EntrepreneurPulse.com.au  Fact Sheet for Healthcare Providers:  IncredibleEmployment.be  This test is no t yet approved or cleared by the [place] FDA and  has been  authorized for detection and/or diagnosis of SARS-CoV-2 by FDA under an Emergency Use Authorization (EUA). This EUA will remain  in effect (meaning this test can be used) for the duration of the COVID-19 declaration under Section 564(b)(1) of the Act, 21 U.S.C.section 360bbb-3(b)(1), unless the authorization is terminated  or revoked sooner.  Influenza A by PCR NEGATIVE NEGATIVE Final   Influenza B by PCR NEGATIVE NEGATIVE Final    Comment: (NOTE) The Xpert Xpress SARS-CoV-2/FLU/RSV plus assay is intended as an aid in the diagnosis of influenza from Nasopharyngeal swab specimens and should not be used as a sole basis for treatment. Nasal washings and aspirates are unacceptable for Xpert Xpress SARS-CoV-2/FLU/RSV testing.  Fact Sheet for Patients: EntrepreneurPulse.com.au  Fact Sheet for Healthcare Providers: IncredibleEmployment.be  This test is not yet approved or cleared by the [place] FDA and has been authorized for detection and/or diagnosis of SARS-CoV-2 by FDA under an Emergency Use Authorization (EUA). This EUA will remain in effect (meaning this test can be used) for the duration of the COVID-19 declaration under Section 564(b)(1) of the Act, 21 U.S.C. section 360bbb-3(b)(1), unless the authorization is terminated or revoked.  Performed at [facility], [address] [place], [place] [address]   Blood culture (routine x 2)     Status: Abnormal   Collection Time: 06/25/21  5:55 AM   Specimen: BLOOD  Result Value Ref Range Status   Specimen Description   Final    BLOOD LEFT ARM Performed at [facility], [address] [place], [place] [address]    Special Requests   Final    BOTTLES DRAWN AEROBIC AND ANAEROBIC Blood Culture adequate volume Performed at [facility], [address] [place], [place] [address]    Culture  Setup Time   Final    GRAM POSITIVE COCCI IN BOTH AEROBIC AND  ANAEROBIC BOTTLES CRITICAL VALUE NOTED.  VALUE IS CONSISTENT WITH PREVIOUSLY REPORTED AND CALLED VALUE. Performed at [facility], [name] [place], [place] [address]    Culture (A)  Final    STREPTOCOCCUS GROUP G SUSCEPTIBILITIES PERFORMED ON PREVIOUS CULTURE WITHIN THE LAST 5 DAYS. Performed at [facility], [place] [address] [place], [place] [address]    Report Status 06/28/2021 FINAL  Final  Urine Culture     Status: Abnormal   Collection Time: 06/25/21  6:55 AM   Specimen: Urine, Clean Catch  Result Value Ref Range Status   Specimen Description   Final    URINE, CLEAN CATCH Performed at [facility], [address] [place], [place] [address]    Special Requests   Final    NONE Performed at [facility], [place], [place] [address]    Culture >=100,000 COLONIES/mL KLEBSIELLA PNEUMONIAE (A)  Final   Report Status 06/27/2021 FINAL  Final   Organism ID, Bacteria KLEBSIELLA PNEUMONIAE (A)  Final      Susceptibility   Klebsiella pneumoniae - MIC*    AMPICILLIN RESISTANT Resistant     CEFAZOLIN <=4 SENSITIVE Sensitive     CEFEPIME <=0.12 SENSITIVE Sensitive     CEFTRIAXONE <=0.25 SENSITIVE Sensitive     CIPROFLOXACIN <=0.25 SENSITIVE Sensitive     GENTAMICIN <=1 SENSITIVE Sensitive     IMIPENEM <=0.25 SENSITIVE Sensitive     NITROFURANTOIN 32 SENSITIVE Sensitive     TRIMETH/SULFA <=20 SENSITIVE Sensitive     AMPICILLIN/SULBACTAM <=2 SENSITIVE Sensitive     PIP/TAZO <=4 SENSITIVE Sensitive     * >=100,000 COLONIES/mL KLEBSIELLA PNEUMONIAE  C Difficile Quick Screen w PCR reflex     Status: None   Collection Time: 06/25/21  6:06 PM   Specimen: Stool  Result Value Ref Range Status   C Diff antigen NEGATIVE NEGATIVE Final   C Diff toxin NEGATIVE NEGATIVE Final  C Diff interpretation No C. difficile detected.  Final    Comment: Performed at [facility], [address] [place], [place] [address]  MRSA Next Gen  by PCR, Nasal     Status: None   Collection Time: 06/25/21 10:55 PM   Specimen: Nasal Mucosa; Nasal Swab  Result Value Ref Range Status   MRSA by PCR Next Gen NOT DETECTED NOT DETECTED Final    Comment: (NOTE) [name] (FDA approved for NASAL specimens only), is one component of a comprehensive MRSA colonization surveillance program. It is not intended to diagnose MRSA infection nor to guide or monitor treatment for MRSA infections. Test performance is not FDA approved in patients less than 26 years old. Performed at [facility], [address] [place], [place] [address]   Culture, blood (Routine X 2) w Reflex to ID Panel     Status: None (Preliminary result)   Collection Time: 06/27/21  8:29 AM   Specimen: BLOOD RIGHT HAND  Result Value Ref Range Status   Specimen Description BLOOD RIGHT HAND  Final   Special Requests   Final    BOTTLES DRAWN AEROBIC AND ANAEROBIC Blood Culture adequate volume   Culture   Final    NO GROWTH 4 DAYS Performed at [facility], [address] [place], [place] [address]    Report Status PENDING  Incomplete  Culture, blood (Routine X 2) w Reflex to ID Panel     Status: None (Preliminary result)   Collection Time: 06/27/21  8:29 AM   Specimen: BLOOD RIGHT HAND  Result Value Ref Range Status   Specimen Description BLOOD RIGHT HAND  Final   Special Requests   Final    BOTTLES DRAWN AEROBIC ONLY Blood Culture adequate volume   Culture   Final    NO GROWTH 4 DAYS Performed at [facility], [address], [place], [place] [address]    Report Status PENDING  Incomplete     Terri Piedra, NP [place] [facility]  07/01/2021  11:59 AM

## 2021-07-01 NOTE — Progress Notes (Signed)
ANTICOAGULATION CONSULT NOTE - Follow Up Consult  Pharmacy Consult for Heparin Indication: atrial fibrillation  Allergies  Allergen Reactions   Aspirin Hives and Other (See Comments)    Pt states that she is unable to take because she had bleeding in her brain   Other Other (See Comments)    Pt states that she is unable to take blood thinners because she had bleeding in her brain; Blood Thinners-per doctor at [facility] (Xarelto)   Penicillins Hives, Shortness Of Breath, Swelling and Other (See Comments)    Has patient had a PCN reaction causing immediate rash, facial/tongue/throat swelling, SOB or lightheadedness with hypotension: Yes Has patient had a PCN reaction causing severe rash involving mucus membranes or skin necrosis: No Has patient had a PCN reaction that required hospitalization No Has patient had a PCN reaction occurring within the last 10 years: No If all of the above answers are "NO", then may proceed with Cephalosporin use.    Patient Measurements: Height: 5' (152.4 cm) Weight: 88.4 kg (194 lb 14.2 oz) IBW/kg (Calculated) : 45.5   Vital Signs: Temp: 98.5 F (36.9 C) (01/09 1200) Temp Source: Oral (01/09 1200) BP: 137/74 (01/09 1200) Pulse Rate: 90 (01/09 0736)  Labs: Recent Labs    06/29/21 1254 06/29/21 1254 06/30/21 0522 06/30/21 1426 07/01/21 0011 07/01/21 1005  HGB 8.9*  --  8.0*  --  8.6*  --   HCT 27.1*  --  24.5*  --  27.8*  --   PLT 155  --  166  --  208  --   APTT  --   --  30  --   --   --   HEPARINUNFRC  --    < > <0.10* <0.10* 0.19* 0.35  CREATININE 0.96  --  0.80  --  0.86  --    < > = values in this interval not displayed.     Estimated Creatinine Clearance: 53.4 mL/min (by C-G formula based on SCr of 0.86 mg/dL).    Assessment: 75yof admitted to [facility] with sepsis found to have vegetation on pace maker lead and transferred to [facility] for explant.  Hx PAF last dose apixaban 1/3 PTA - admit hgb fel 7 and pt with black tarry stool.  GI  work up conservative management at that time with other illness.   Heparin drip rate increased 1500 uts/hr Heparin level 0.35 at goal. No issues with bleeding or bloody stools noted.   Hgb 8.6 stable  Plt stable  Goal of Therapy:  Heparin level 0.3-0.7 units/ml Monitor platelets by anticoagulation protocol: Yes   Plan:  Continue heparin 1500 units/hr. No bolus  Daily heparin level and CBC Monitor s/s bleeding  Follow up when EP procedure timing to hold heparin prior   Bonnita Nasuti Pharm.D. CPP, BCPS Clinical Pharmacist [phone] 07/01/2021 2:30 PM

## 2021-07-01 NOTE — Plan of Care (Signed)
°  Problem: Education: Goal: Knowledge of General Education information will improve Description: Including pain rating scale, medication(s)/side effects and non-pharmacologic comfort measures Outcome: Progressing   Problem: Activity: Goal: Risk for activity intolerance will decrease Outcome: Progressing   Problem: Nutrition: Goal: Adequate nutrition will be maintained Outcome: Progressing   Problem: Coping: Goal: Level of anxiety will decrease Outcome: Progressing   Problem: Skin Integrity: Goal: Risk for impaired skin integrity will decrease Outcome: Progressing   

## 2021-07-01 NOTE — Progress Notes (Signed)
Mobility Specialist Progress Note    07/01/21 1138  Mobility  Activity Ambulated in room  Level of Assistance Standby assist, set-up cues, supervision of patient - no hands on  Assistive Device Four wheel walker  Distance Ambulated (ft) 28 ft  Mobility Ambulated with assistance in room  Mobility Response Tolerated fair  Mobility performed by Mobility specialist  Bed Position Chair  $Mobility charge 1 Mobility   Pt received in chair and agreeable. Ambulated on 4LO2. Pt quickly fatigued. Returned to chair with call bell in reach.   [facility] Mobility Specialist  M.S. 2C and 6E: [phone] M.S. 4E: [phone]

## 2021-07-01 NOTE — Consult Note (Signed)
° °  [facility] CM Inpatient Consult   07/01/2021  Debra Saunders 06-Feb-1946 [id number]  [facility] [ACO] Patient:  Primary Care Provider:  [name], MD, [facility] station, Embedded provider with a chronic care management team and programs   Patient is currently active with [facility] for chronic disease management services.  Patient has been engaged by a [facility] RN CM.  Our community based plan of care has focused on disease management and community resource support.    Plan: Will update [facility] RN of hospitalization and post hospital follow up needs.  Of note, [facility] Care Management services does not replace or interfere with any services that are needed or arranged by inpatient [facility] care management team.  For additional questions or referrals please contact:    [name], RN BSN [facility] Liaison  [phone] business mobile phone Toll free office [phone]  Fax number: [phone] [email or web address]   .

## 2021-07-02 ENCOUNTER — Other Ambulatory Visit (HOSPITAL_COMMUNITY): Payer: Self-pay

## 2021-07-02 ENCOUNTER — Inpatient Hospital Stay: Payer: Self-pay

## 2021-07-02 ENCOUNTER — Encounter: Payer: Self-pay | Admitting: Internal Medicine

## 2021-07-02 DIAGNOSIS — J9621 Acute and chronic respiratory failure with hypoxia: Secondary | ICD-10-CM

## 2021-07-02 LAB — BASIC METABOLIC PANEL
Anion gap: 6 (ref 5–15)
BUN: 13 mg/dL (ref 8–23)
CO2: 35 mmol/L — ABNORMAL HIGH (ref 22–32)
Calcium: 7.8 mg/dL — ABNORMAL LOW (ref 8.9–10.3)
Chloride: 92 mmol/L — ABNORMAL LOW (ref 98–111)
Creatinine, Ser: 1.02 mg/dL — ABNORMAL HIGH (ref 0.44–1.00)
GFR, Estimated: 56 mL/min — ABNORMAL LOW (ref >60)
Glucose, Bld: 187 mg/dL — ABNORMAL HIGH (ref 70–99)
Potassium: 4 mmol/L (ref 3.5–5.1)
Sodium: 133 mmol/L — ABNORMAL LOW (ref 135–145)

## 2021-07-02 LAB — CULTURE, BLOOD (ROUTINE X 2)
Culture: NO GROWTH
Culture: NO GROWTH
Special Requests: ADEQUATE
Special Requests: ADEQUATE

## 2021-07-02 LAB — CBC
HCT: 24.9 % — ABNORMAL LOW (ref 36.0–46.0)
Hemoglobin: 8 g/dL — ABNORMAL LOW (ref 12.0–15.0)
MCH: 27.2 pg (ref 26.0–34.0)
MCHC: 32.1 g/dL (ref 30.0–36.0)
MCV: 84.7 fL (ref 80.0–100.0)
Platelets: 203 10*3/uL (ref 150–400)
RBC: 2.94 MIL/uL — ABNORMAL LOW (ref 3.87–5.11)
RDW: 14.6 % (ref 11.5–15.5)
WBC: 18.3 10*3/uL — ABNORMAL HIGH (ref 4.0–10.5)
nRBC: 0 % (ref 0.0–0.2)

## 2021-07-02 LAB — GLUCOSE, CAPILLARY
Glucose-Capillary: 165 mg/dL — ABNORMAL HIGH (ref 70–99)
Glucose-Capillary: 165 mg/dL — ABNORMAL HIGH (ref 70–99)
Glucose-Capillary: 218 mg/dL — ABNORMAL HIGH (ref 70–99)
Glucose-Capillary: 195 mg/dL — ABNORMAL HIGH (ref 70–99)

## 2021-07-02 LAB — MAGNESIUM: Magnesium: 1.6 mg/dL — ABNORMAL LOW (ref 1.7–2.4)

## 2021-07-02 MED ORDER — SELEXIPAG 1600 MCG PO TABS
1600.0000 ug | ORAL_TABLET | Freq: Two times a day (BID) | ORAL | Status: DC
  Administered 2021-07-02 – 2021-07-12 (×21): 1600 ug via ORAL
  Filled 2021-07-02 (×21): qty 1

## 2021-07-02 MED ORDER — MAGNESIUM SULFATE 4 GM/100ML IV SOLN
4.0000 g | Freq: Once | INTRAVENOUS | Status: AC
  Administered 2021-07-02: 4 g via INTRAVENOUS
  Filled 2021-07-02: qty 100

## 2021-07-02 MED ORDER — MAGNESIUM SULFATE 2 GM/50ML IV SOLN: 2.0000 g | Freq: Once | INTRAVENOUS | Status: DC

## 2021-07-02 MED ORDER — SELEXIPAG 800 MCG PO TABS: 1600.0000 ug | ORAL_TABLET | Freq: Two times a day (BID) | ORAL | Status: DC

## 2021-07-02 MED ORDER — APIXABAN 5 MG PO TABS
5.0000 mg | ORAL_TABLET | Freq: Two times a day (BID) | ORAL | Status: DC
  Administered 2021-07-02 – 2021-07-12 (×22): 5 mg via ORAL
  Filled 2021-07-02 (×22): qty 1

## 2021-07-02 MED ORDER — SELEXIPAG 800 MCG PO TABS
1600.0000 ug | ORAL_TABLET | Freq: Two times a day (BID) | ORAL | Status: DC
  Filled 2021-07-02: qty 2

## 2021-07-02 NOTE — Progress Notes (Addendum)
Advanced Heart Failure Rounding Note   Subjective:    Remains in AF on amio gtt at 30/hr + diltiazem 30 mg q 6 hrs. Anticoagulated with heparin gtt.  Bld cultures 1/5---> No growth.   1/9- EP consulted. Not a candidate for extraction d/t comorbid conditions.   Denies SOB.  Had some abdominal pain over night.  Abdominal resolved. No BM since admit.   Objective:   Weight Range:  Vital Signs:   Temp:  [97.7 F (36.5 C)-98.8 F (37.1 C)] 97.7 F (36.5 C) (01/10 0504) Pulse Rate:  [82-90] 84 (01/10 0504) Resp:  [16-26] 20 (01/10 0504) BP: (115-137)/(61-83) 115/61 (01/10 0638) SpO2:  [90 %-93 %] 90 % (01/10 0504) Weight:  [92.3 kg] 92.3 kg (01/10 0500) Last BM Date: 06/29/21  Weight change: Filed Weights   06/30/21 0621 07/01/21 0613 07/02/21 0500  Weight: 89.8 kg 88.4 kg 92.3 kg    Intake/Output:   Intake/Output Summary (Last 24 hours) at 07/02/2021 0721 Last data filed at 07/02/2021 0400 Gross per 24 hour  Intake 722.69 ml  Output 1850 ml  Net -1127.31 ml     Physical Exam: General:  No resp difficulty HEENT: normal Neck: supple. no JVD. Carotids 2+ bilat; no bruits. No lymphadenopathy or thryomegaly appreciated. Cor: PMI nondisplaced. Irregular rate & rhythm. No rubs, gallops or murmurs. Lungs: clear on 4 liters  [place] Abdomen: soft, nontender, nondistended. No hepatosplenomegaly. No bruits or masses. Slugggish bowel sounds. Extremities: no cyanosis, clubbing, rash, edema Neuro: alert & orientedx3, cranial nerves grossly intact. moves all 4 extremities w/o difficulty. Affect pleasant  Telemetry: A fib 90s  Labs: Basic Metabolic Panel: Recent Labs  Lab 06/28/21 0500 06/29/21 1254 06/30/21 0522 07/01/21 0011 07/02/21 0142  NA 134* 140 138 137 133*  K 3.8 3.9 3.9 3.4* 4.0  CL 100 96* 94* 95* 92*  CO2 30 34* 34* 35* 35*  GLUCOSE 242* 226* 175* 176* 187*  BUN 76* 31* 19 17 13   CREATININE 1.46* 0.96 0.80 0.86 1.02*  CALCIUM 7.5* 8.3* 8.1* 8.0* 7.8*  MG   --   --   --  1.8 1.6*    Liver Function Tests: Recent Labs  Lab 06/26/21 0847 06/28/21 0500 06/29/21 1254 06/29/21 1608 06/30/21 0522  AST 16 13* 14* 13* 11*  ALT 11 12 11 12 9   ALKPHOS 137* 123 126 122 99  BILITOT 1.9* 1.0 1.1 0.9 0.9  PROT 6.6 6.3* 6.4* 6.8 5.8*  ALBUMIN 2.3* 2.4* 2.3* 2.3* 2.0*   No results for input(s): LIPASE, AMYLASE in the last 168 hours.  No results for input(s): AMMONIA in the last 168 hours.  CBC: Recent Labs  Lab 06/28/21 0500 06/29/21 0507 06/29/21 1254 06/30/21 0522 07/01/21 0011 07/02/21 0142  WBC 9.3 15.8* 18.3* 20.6* 19.7* 18.3*  NEUTROABS 7.3 11.9*  --   --   --   --   HGB 7.7* 8.0* 8.9* 8.0* 8.6* 8.0*  HCT 23.4* 24.4* 27.1* 24.5* 27.8* 24.9*  MCV 83.9 83.6 84.2 85.1 86.3 84.7  PLT 118* 135* 155 166 208 203    Cardiac Enzymes: No results for input(s): CKTOTAL, CKMB, CKMBINDEX, TROPONINI in the last 168 hours.  BNP: BNP (last 3 results) Recent Labs    08/24/20 1506 03/27/21 1530 06/25/21 1350  BNP 51.2 31.1 344.7*    ProBNP (last 3 results) No results for input(s): PROBNP in the last 8760 hours.    Other results:  Imaging: No results found.   Medications:  Scheduled Medications:  Difluprednate  1 drop Both Eyes BID   digoxin  0.125 mg Oral Daily   diltiazem  30 mg Oral Q6H   DULoxetine  60 mg Oral Daily   gabapentin  600 mg Oral TID   insulin aspart  0-9 Units Subcutaneous TID WC   macitentan  10 mg Oral Daily   pantoprazole (PROTONIX) IV  40 mg Intravenous Q24H   polyethylene glycol  17 g Oral BID   rosuvastatin  10 mg Oral Daily   Selexipag  1,600 mcg Oral BID   sildenafil  20 mg Oral TID   sodium chloride flush  3 mL Intravenous Q12H   spironolactone  25 mg Oral Daily   torsemide  40 mg Oral BID    Infusions:  sodium chloride     amiodarone 30 mg/hr (07/01/21 2155)   cefTRIAXone (ROCEPHIN)  IV 2 g (07/01/21 1313)   heparin 1,500 Units/hr (07/01/21 1838)    PRN Medications: sodium  chloride, acetaminophen **OR** acetaminophen, albuterol, cholestyramine light, hydrALAZINE, HYDROcodone-acetaminophen, morphine injection, sodium chloride flush   Assessment/Plan:   1. 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 - shock resolved - ID following  -Bld Cx 1/5 - No growth  - Continue ceftriaxone - Await ID recommendations.  - EP did not recommend extraction due to co-morbid conditions.    2. Acute on chronichypoxic respiratory failure with CAP - has underlying ILD. On home O2 - ? Septic embolization to lung -  ID following  - Continue ceftriaxone -On 4 liters [place] which is her home regimen. Sats stable.    3. Pulmonary hypertension: She was followed in the past at [facility].  Based on 4/19 cath, she had severe PAH with PVR 7.2 WU. Chronic hypoxemic respiratory failure, on home oxygen.  She has a history of remote DVT (2004) but V/Q scan negative for evidence of chronic PE.  High resolution CT chest in 10/19 showed pulmonary fibrosis, and PFTs in 2017 showed severe restriction.  Serological workup for rheumatological diseases was unremarkable.  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 Amiodarone was stopped given lung parenchymal disease. 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  - PH meds resumed sildenafil 20 tid, macitentan 10 daily - Decision made to resume selexipeg at 800 bid (half PTA dose) given that she was off > 3 days.  - Can likely increase to 1600 po bid prior to d/c - Continue digoxin for RV support - Continue CPAP and home oxygen.    4. PAF with tachy-brady syndrome - back in AF now - amio stopped in 2019 due to underlying lung disease.  - Failed rate control with oral  cardizem. Amio gtt added with improved rate control. If doesn't convert will need TEE/DC-CV. (Was off AFC for several days) - Per EP not a candidate for extraction.  - On apixaban PTA. Stopped due to concern over GIB on admit. Hgb 8 today.  - Set up TEE/ DC-CV soon.    5. AKI due to ATN/shock - resolved - baseline Cr 1.03 - peak Scr  3.21 -> 1.0 - Resolved.    6. Chronic diastolic HF - Volume status stable.  - Continue Torsemide 40 mg BID (home dose) - Continue spiro 25 mg daily -  consider SGLT2i prior to d/c - Renal function stable.    7. Severe HTN - Stable on home medications. - cover with PRN hydrlazine   8. H/o DVT/PE - On heparin gtt   9. DM2 - insulin. - cover with SSI    10. OSA on CPAP - continue CPAP  11. Hypokalemia/hypomagnesemia: - K stable.  - Give 4 grams Mag   12. Anemia  -Hgb 8 today.  -No obvious source.  -Check FOBT - Ideally would restart eliquis soon   Discussed with Dr [name]. Set up TEE/DC-CV on Thursday. Restart eliquis today.  Disposition- lives alone. Will need HH. She does not want SNF.   Length of Stay: 3   Amy Clegg  NP-C  07/02/2021, 7:21 AM  Advanced Heart Failure Team Pager [phone] (M-F; 7a - 4p)  Please contact [facility] for night-coverage after hours (4p -7a ) and weekends on [email or web address]   Patient seen with NP, agree with the above note.   Afebrile, WBCs trending down (18 today).  1/5 blood cultures NGTD.  She remains in atrial fibrillation on amiodarone gtt. Seen by EP, thought to be too high risk for device extraction.   General: NAD Neck: No JVD, no thyromegaly or thyroid nodule.  Lungs: Clear to auscultation bilaterally with normal respiratory effort. CV: Nondisplaced PMI.  Heart irregular S1/S2, no S3/S4, no murmur.  Left lower leg lymphedema.  Abdomen: Soft, nontender, no hepatosplenomegaly, no distention.  Skin: Intact without lesions or rashes.  Neurologic: Alert and oriented x 3.  Psych: Normal  affect. Extremities: No clubbing or cyanosis.  HEENT: Normal.   1. Endocarditis: RV pacing wire with vegetation on TEE.  Group G strep bacteremia.  Septic emboli to lungs. Seen by EP, thought to be too high risk for device extraction.  - ID following.  - She is on ceftriaxone.  Will eventually need PICC placement and likely 6 wks IV abx followed by long-term oral suppression.  2. Acute on chronic hypoxemic respiratory failure: She now has PNA from septic emboli to lungs.  At baseline, on 4L home oxygen with CPAP at night for interstitial lung disease and OSA.   - Antibiotics as above for PNA.  - She is down to 4 L by [place].  3. Pulmonary hypertension: Based on 4/19 cath, she had severe PAH with PVR 7.2 WU. Chronic hypoxemic respiratory failure, on home oxygen.  She has a history of remote DVT (2004) but V/Q scan negative for evidence of chronic PE.  High resolution CT chest in 10/19 showed pulmonary fibrosis, and PFTs in 2017 showed severe restriction.  Serological workup for rheumatological diseases was unremarkable.  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 Amiodarone was stopped in past given lung parenchymal disease. 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  - She is back on sildenafil 20 tid and macitentan 10 daily.  - Selexipag increased back to home dose 1600 bid.   - Continue digoxin for RV support.  - Home oxygen, CPAP.  4. Atrial fibrillation with tachy-brady syndrome: She is currently in atrial fibrillation with controlled rate. Had been off amiodarone since 2019 due to interstitial lung disease/pulmonary fibrosis.  - Continue po diltiazem for now.   - Amiodarone gtt for now => use for rate control and plan short course once we get her back into NSR.  Would avoid long-term use.  - Can  transition back to Eliquis since no device extraction  planned.  - Aim for TEE-guided DCCV on Thursday.  Will need TEE since she was off anticoagulation for a couple of days after last TEE.  5. AKI: Resolved, creatinine 1.02.  6. 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 Volume status ok.  - Continue home torsemide 40 mg bid.  - Continue spironolactone 25 mg daily.  7. HTN: BP controlled. 8. H/o DVT: Anticoagulated.  9. Left leg lymphedema: Chronic.  10. OSA: CPAP.   Walk with PT.   [name] 07/02/2021 8:50 AM

## 2021-07-02 NOTE — Progress Notes (Signed)
°   07/01/21 2356  Assess: MEWS Score  Temp 97.7 F (36.5 C)  BP 127/83  Pulse Rate 82  ECG Heart Rate 78  Resp (!) 26  Level of Consciousness Alert  SpO2 93 %  O2 Device CPAP  Assess: MEWS Score  MEWS Temp 0  MEWS Systolic 0  MEWS Pulse 0  MEWS RR 2  MEWS LOC 0  MEWS Score 2  MEWS Score Color Yellow  Assess: if the MEWS score is Yellow or Red  Were vital signs taken at a resting state? Yes  Focused Assessment No change from prior assessment  Early Detection of Sepsis Score *See Row Information* Low  MEWS guidelines implemented *See Row Information* No, previously yellow, continue vital signs every 4 hours  Treat  Pain Scale 0-10  Pain Score 0  Notify: Charge Nurse/RN  Name of Charge Nurse/RN Notified Shanon Ace, RN  Date Charge Nurse/RN Notified 07/01/21  Time Charge Nurse/RN Notified 2345

## 2021-07-02 NOTE — Progress Notes (Signed)
ANTICOAGULATION CONSULT NOTE - Follow Up Consult  Pharmacy Consult for Heparin > Apixaban Indication: atrial fibrillation  Allergies  Allergen Reactions   Aspirin Hives and Other (See Comments)    Pt states that she is unable to take because she had bleeding in her brain   Other Other (See Comments)    Pt states that she is unable to take blood thinners because she had bleeding in her brain; Blood Thinners-per doctor at [facility] (Xarelto)   Penicillins Hives, Shortness Of Breath, Swelling and Other (See Comments)    Has patient had a PCN reaction causing immediate rash, facial/tongue/throat swelling, SOB or lightheadedness with hypotension: Yes Has patient had a PCN reaction causing severe rash involving mucus membranes or skin necrosis: No Has patient had a PCN reaction that required hospitalization No Has patient had a PCN reaction occurring within the last 10 years: No If all of the above answers are "NO", then may proceed with Cephalosporin use.    Patient Measurements: Height: 5' (152.4 cm) Weight: 92.3 kg (203 lb 7.8 oz) IBW/kg (Calculated) : 45.5   Vital Signs: Temp: 97.7 F (36.5 C) (01/10 0504) Temp Source: Oral (01/10 0504) BP: 115/61 (01/10 0141) Pulse Rate: 78 (01/10 0910)  Labs: Recent Labs    06/30/21 0522 06/30/21 1426 07/01/21 0011 07/01/21 1005 07/02/21 0142  HGB 8.0*  --  8.6*  --  8.0*  HCT 24.5*  --  27.8*  --  24.9*  PLT 166  --  208  --  203  APTT 30  --   --   --   --   HEPARINUNFRC <0.10* <0.10* 0.19* 0.35  --   CREATININE 0.80  --  0.86  --  1.02*     Estimated Creatinine Clearance: 46.1 mL/min (A) (by C-G formula based on SCr of 1.02 mg/dL (H)).  Assessment: 75yof admitted to [facility] with sepsis found to have vegetation on pace maker lead and transferred to [facility] for explant.  Hx PAF last dose apixaban 1/3 PTA - admit hgb fel 7 and pt with black tarry stool.  GI work up conservative management at that time with other illness.   Heparin  level within goal range this AM.  No overt bleeding or complications noted.  Hgb drifting down slightly 8.6 > 8; Pltc 203.  Goal of Therapy:   Monitor platelets by anticoagulation protocol: Yes   Plan:  Stop IV heparin since no imminent plans for pacer extraction. Start Eliquis 5 mg BID.  [place] Crane, Roylene Reason, BCCP Clinical Pharmacist  07/02/2021 10:35 AM   [facility] pharmacy phone numbers are listed on [email or web address]

## 2021-07-02 NOTE — Plan of Care (Signed)
°  Problem: Education: Goal: Knowledge of General Education information will improve Description: Including pain rating scale, medication(s)/side effects and non-pharmacologic comfort measures Outcome: Progressing   Problem: Health Behavior/Discharge Planning: Goal: Ability to manage health-related needs will improve Outcome: Progressing   Problem: Clinical Measurements: Goal: Ability to maintain clinical measurements within normal limits will improve Outcome: Progressing Goal: Respiratory complications will improve Outcome: Progressing Goal: Cardiovascular complication will be avoided Outcome: Progressing   Problem: Activity: Goal: Risk for activity intolerance will decrease Outcome: Progressing   Problem: Nutrition: Goal: Adequate nutrition will be maintained Outcome: Progressing

## 2021-07-02 NOTE — Progress Notes (Signed)
Arrived to room to discuss bedside PICC placement including risks, benefits, and alternatives. Patient requested waiting until 07-03-21 AM for placement. Primary RN notified. VAST to follow up in AM.

## 2021-07-02 NOTE — Care Management Important Message (Signed)
Important Message  Patient Details  Name: Debra Saunders MRN: [id number] Date of Birth: 19-Mar-1946   Medicare Important Message Given:  Yes     Orbie Pyo 07/02/2021, 3:15 PM

## 2021-07-02 NOTE — Progress Notes (Signed)
Mobility Specialist Progress Note    07/02/21 1454  Mobility  Activity Ambulated in hall  Level of Assistance Minimal assist, patient does 75% or more  Assistive Device Four wheel walker  Distance Ambulated (ft) 110 ft  Mobility Ambulated with assistance in hallway  Mobility Response Tolerated fair  Mobility performed by Mobility specialist  Bed Position Chair  $Mobility charge 1 Mobility   Pt received in bed and agreeable. Pt c/o stomach and head pain. Ambulated on 4LO2 and encouraged pursed lip breathing. Took x1 seated rest break to recover. Upon return to room used [facility] and had successful BM. Left in chair with call bell in reach.   [facility] Mobility Specialist  M.S. 2C and 6E: [phone] M.S. 4E: [phone]

## 2021-07-02 NOTE — TOC Initial Note (Addendum)
Transition of Care [facility]) - Initial/Assessment Note    Patient Details  Name: Debra Saunders MRN: [id number] Date of Birth: [birth year]  Transition of Care [facility]) CM/SW Contact:    Erenest Rasher, RN Phone Number: [phone] 07/02/2021, 11:47 AM  Clinical Narrative:                 HF TOC CM spoke to pt and offered choice for [facility]. Pt states she had HH in the past. Pt had HH with [facility]. Contacted [facility] rep, Stacie with new referral. Contacted [facility] Coordinator, Pam for home IV abx. Pt states she was scheduled for sleep study for a new CPAP, her CPAP does not work. Her DME is with [facility]. Has oxygen, Rollator, bedside commode and cane. Contacted [facility] to check on CPAP. Contacted [facility], spoke to rep, Wells Guiles. New order for CPAP, pt's device is broken (not repairable). Attending made aware.    Faxed order for new CPAP to [facility].    Expected Discharge Plan: [place] Barriers to Discharge: Continued Medical Work up   Patient Goals and CMS Choice Patient states their goals for this hospitalization and ongoing recovery are:: patient wants to get better CMS Medicare.gov Compare Post Acute Care list provided to:: Patient Choice offered to / list presented to : Patient  Expected Discharge Plan and Services Expected Discharge Plan: [place]   Discharge Planning Services: CM Consult Post Acute Care Choice: [place] arrangements for the past 2 months: Single Family Home   Prior Living Arrangements/Services Living arrangements for the past 2 months: Single Family Home Lives with:: Self Patient language and need for interpreter reviewed:: Yes Do you feel safe going back to the place where you live?: Yes      Need for Family Participation in Patient Care: Yes (Comment) Care giver support system in place?: Yes (comment) Current home services: DME (rolling walker with seat, CPAP ([facility]), oxygen ([facility]), bedside  commode) Criminal Activity/Legal Involvement Pertinent to Current Situation/Hospitalization: No - Comment as needed  Activities of Daily Living Home Assistive Devices/Equipment: Other (Comment) ADL Screening (condition at time of admission) Patient's cognitive ability adequate to safely complete daily activities?: Yes Is the patient deaf or have difficulty hearing?: No Does the patient have difficulty seeing, even when wearing glasses/contacts?: No Does the patient have difficulty concentrating, remembering, or making decisions?: No Patient able to express need for assistance with ADLs?: Yes Does the patient have difficulty dressing or bathing?: No Independently performs ADLs?: No Communication: Independent Dressing (OT): Needs assistance Does the patient have difficulty walking or climbing stairs?: Yes Weakness of Legs: None Weakness of Arms/Hands: None  Permission Sought/Granted Permission sought to share information with : Case Manager, Family Supports, PCP Permission granted to share information with : Yes, Verbal Permission Granted  Share Information with NAME: Rockne Menghini  Permission granted to share info w AGENCY: Home Health, DME  Permission granted to share info w Relationship: daughter  Permission granted to share info w Contact Information: [phone]  Emotional Assessment Appearance:: Appears stated age Attitude/Demeanor/Rapport: Engaged Affect (typically observed): Accepting Orientation: : Oriented to Self, Oriented to Place, Oriented to  Time, Oriented to Situation   Psych Involvement: No (comment)  Admission diagnosis:  Bacteremia due to Streptococcus [R78.81, B95.5] Patient Active Problem List   Diagnosis Date Noted   Pressure injury of skin 07/01/2021   Acute blood loss anemia 06/29/2021   klebsiella pneumoniae UTI 06/29/2021   Septic shock (  [place]) 06/28/2021   Sepsis due to pneumonia ([name]) 06/25/2021   Multifocal pneumonia 06/25/2021   GI bleeding  06/25/2021   Black stool 06/25/2021   Severe sepsis with septic shock secondary to infected pacemaker with streptoccos bacteremia  06/25/2021   HTN (hypertension)    HLD (hyperlipidemia)    Chronic diastolic CHF (congestive heart failure) ([place])    Acute renal failure superimposed on stage 3a chronic kidney disease (HCC)    DVT (deep venous thrombosis) (HCC)    Hyponatremia    Thrombocytopenia (HCC)    Diarrhea    AKI (acute kidney injury) ([place])    Upper respiratory infection 05/24/2021   Burn 04/30/2020   Obesity (BMI 30-39.9) 04/30/2020   Elevated alkaline phosphatase level 04/30/2020   Chronic diarrhea 11/04/2019   Dysuria 09/29/2019   Abnormal mammogram 09/29/2019   Breast pain, right 08/08/2019   Bilateral ovarian cysts 08/08/2019   B12 deficiency 08/02/2019   Iron deficiency 08/01/2019   Dizziness 07/06/2019   Shortness of breath    Atrial fibrillation with tachy brady syndrome  09/22/2017   Acute bilateral low back pain without sciatica 01/28/2017   Pain in limb 07/22/2016   Neuropathy 06/09/2016   UTI (urinary tract infection) 03/03/2016   Chronic deep vein thrombosis (DVT) ([place]) 03/03/2016   Chronic back pain 10/19/2015   Goiter 04/25/2015   Cervical disc disorder with radiculopathy of cervical region 03/30/2015   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   Interstitial lung disease ([place]) 04/06/2014   Abnormality of gait 01/13/2014   Sinoatrial node dysfunction ([place]) 01/09/2014   Allergic rhinitis 10/26/2013   Lymphedema 08/03/2013   Constipation 04/12/2013   Costochondritis 01/19/2013   Fatigue 09/27/2012   Normocytic anemia 04/28/2012   Acute on chronic respiratory failure with hypoxia ([place]) 04/26/2012   OSA (obstructive sleep apnea) 04/22/2012   Diabetes ([place]) 03/30/2012   Pulmonary hypertension, unspecified ([place]) 03/30/2012   Hyperlipidemia 03/30/2012   Hypertension    PCP:  [name], MD Pharmacy:   [facility] [address] ([address]),  [place]) [place] [address] Phone: [phone] Fax: [place] [facility] [place], [place] [address] Phone: [phone] Fax: [place], [place] [address] [place] [address] Phone: [phone] Fax: [phone]     Social Determinants of Health (SDOH) Interventions    Readmission Risk Interventions No flowsheet data found.

## 2021-07-02 NOTE — TOC Benefit Eligibility Note (Signed)
Patient Teacher, English as a foreign language completed.    The patient is currently admitted and upon discharge could be taking Farxiga 10 mg.  The current 30 day co-pay is, $95.00.   The patient is currently admitted and upon discharge could be taking Jardiance 10 mg.  The current 30 day co-pay is, $45.00.   The patient is insured through [employer], [place] Patient Advocate Specialist [place] Patient Advocate Team Direct Number: [phone]  Fax: [phone]

## 2021-07-02 NOTE — Progress Notes (Addendum)
PROGRESS NOTE    Debra Saunders  DJS:[id number] DOB: 1945/09/26 DOA: 06/29/2021 PCP: [name], MD   Brief Narrative: 76 year old with past medical history significant for hypertension, hyperlipidemia, diabetes, depression, subdural hematoma 2014, sinoatrial node dysfunction status post pacemaker, OSA, PAH, interstitial lung disease on chronic 4 L of oxygen, diastolic heart failure, A. fib on Eliquis, CKD stage IIIa, cardiorenal syndrome, DVT presents to [facility] ER on 06/25/2021 with shortness of breath, cough fevers chills dysuria and right upper quadrant pain.  Patient admitted with severe sepsis with septic shock secondary to multifocal pneumonia question septic emboli and strep bacteremia, Klebsiella UTI.  She required Levophed and stress dose steroids for a short time.  TEE 1/6 consistent with infected pacemaker lead with vegetation.  Patient also require 1 unit of packed red blood cell for hemoglobin of 7, evaluated by GI, no plans for scope.  She also had acute on chronic renal failure which has improved.  Shock liver from sepsis which has improved. Patient was transferred to [facility] 06/29/2021 for pacer extraction.  Patient developed A. fib with RVR, she was a started on amiodarone.  Patient evaluated by EP, patient was considered not a candidate for transvenous lead extraction because of her significant comorbid medical condition which put her at prohibitive risk.  We recommended ID to determine best antibiotic treatment.   Assessment & Plan:   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  Active Problems:   Hypertension   Diabetes ([place])   Pulmonary hypertension, unspecified (HCC)   Hyperlipidemia   OSA (obstructive sleep apnea)   Acute on chronic respiratory failure with hypoxia (HCC)   Atrial fibrillation with tachy brady syndrome    Chronic diastolic CHF (congestive heart failure) (HCC)   Acute renal  failure superimposed on stage 3a chronic kidney disease (HCC)   DVT (deep venous thrombosis) (HCC)   Acute blood loss anemia   klebsiella pneumoniae UTI   Pressure injury of skin   1-Sever Sepsis, Septic Shock secondary to infected pacemaker, vegetation pacer wire, infective endocarditis.  Streptococcus bacteremia, multifocal pneumonia: -POA. Blood culture 1/03: Grew  Streptococcus group G -TEE: Large mobile vegetation on the RV pacing wire noted in the right  atrium, with estimated to be 1 x 1.5 cm in size or larger.  -Shock resolved, patient received IV Levophed and IV fluids. -Blood culture: 1/03 no growth to date.  -ID and cardiology following. -EP consulted for pacemaker lead extraction.  Patient was considered not a candidate for transvenous lead extraction because of her significant comorbid medical conditions. -Continue with IV ceftriaxone.  ID will arrange for penicillin allergy testing. -Per ID patient will need a prolonged course of antibiotics treatment followed by chronic suppression.  -We will order PICC line  2-A. fib RVR, history of tachybradycardia syndrome status post pacemaker placement: -Continue Cardizem and digoxin.  Started on IV amiodarone 1/8 due to A fib RVR>  -back on Eliquis.   3-Pulmonary Hypertension/ILD?Acute on chronic hypoxic respiratory failure On Sildenafil/macitentan/selexipag, these medications were held briefly due to shock and hypotension initially. -Initially required BiPAP -Currently on chronic 4 L of oxygen   Chronic diastolic heart failure: Appreciate HF team.  Continue with torsemide, spironolactone.   AKI on CKD stage II Cr peak to 3.2 Cr trending down to 0.8 Prior cr range: 1.0-1.1 Monitor on diuretics.   Anemia of chronic disease: -Post 1 unit of packed red blood cell -FOBT negative -hb stable 8.6  History of DVT status post IVC filter 2015  Diet-controlled diabetes type 2 SGLT2 inhibitor prior to discharge per  cardiology  History of subdural hematoma 2014 Hypomagnesemia; replete IV>  History of chronic diarrhea, related to medications for pulmonary hypertension -On cholestyramine PRN No BM since admission, will order one dose of senna Obesity/OSA on CPAP BMI 38.6  See below nursing skin assessment:    Pressure Injury 06/29/21 Right Stage 1 -  Intact skin with non-blanchable redness of a localized area usually over a bony prominence. abrasion on right gluteal fold (Active)  06/29/21 1500  Location:   Location Orientation: Right  Staging: Stage 1 -  Intact skin with non-blanchable redness of a localized area usually over a bony prominence.  Wound Description (Comments): abrasion on right gluteal fold  Present on Admission: Yes      Estimated body mass index is 39.74 kg/m as calculated from the following:   Height as of this encounter: 5' (1.524 m).   Weight as of this encounter: 92.3 kg.   DVT prophylaxis: heparin GTT Code Status: Full code Family Communication: care discussed with patient.  Disposition Plan:  Status is: Inpatient  Remains inpatient appropriate because: needs treatment for endocarditis.     Consultants:  Cardiology  EP ID  Procedures:  TEE:   1. Large mobile vegetation on the RV pacing wire noted in the right  atrium, with estimated to be 1 x 1.5 cm in size or larger.   2. Left ventricular ejection fraction, by estimation, is 55 to 60%. The  left ventricle has normal function. The left ventricle has no regional  wall motion abnormalities. There is mild left ventricular hypertrophy.   3. Right ventricular systolic function is normal. The right ventricular  size is normal.   4. No left atrial/left atrial appendage thrombus was detected.   5. The mitral valve is normal in structure. Mild to moderate mitral valve  regurgitation. No evidence of mitral stenosis.   6. The aortic valve is normal in structure. There is moderate  calcification of the aortic  valve. Aortic valve regurgitation is not  visualized. No aortic stenosis is present.   7. There is mild (Grade II) atheroma plaque involving the aortic arch and  descending aorta.   8. Agitated saline contrast bubble study was negative, with no evidence  of any interatrial shunt.   Antimicrobials:  Ceftriaxone.   Subjective: She is feeling better, dyspnea improved. No BM yet. drinking miralax.   Objective: Vitals:   07/02/21 0504 07/02/21 0638 07/02/21 0910 07/02/21 1134  BP: 115/61 115/61  (!) 125/57  Pulse: 84  78   Resp: 20     Temp: 97.7 F (36.5 C)     TempSrc: Oral     SpO2: 90%     Weight:      Height:        Intake/Output Summary (Last 24 hours) at 07/02/2021 1335 Last data filed at 07/02/2021 1130 Gross per 24 hour  Intake 725.69 ml  Output 2850 ml  Net -2124.31 ml    Filed Weights   06/30/21 0621 07/01/21 0613 07/02/21 0500  Weight: 89.8 kg 88.4 kg 92.3 kg    Examination:  General exam: NAD Respiratory system: No wheezing, no crackles.  Cardiovascular system: S 1, S 2 IRR,  Gastrointestinal system: ABS present, soft, nt Central nervous system: Alert, follows command Extremities: trace edema   Data Reviewed: I have personally reviewed following labs and imaging studies  CBC: Recent Labs  Lab  06/28/21 0500 06/29/21 0507 06/29/21 1254 06/30/21 0522 07/01/21 0011 07/02/21 0142  WBC 9.3 15.8* 18.3* 20.6* 19.7* 18.3*  NEUTROABS 7.3 11.9*  --   --   --   --   HGB 7.7* 8.0* 8.9* 8.0* 8.6* 8.0*  HCT 23.4* 24.4* 27.1* 24.5* 27.8* 24.9*  MCV 83.9 83.6 84.2 85.1 86.3 84.7  PLT 118* 135* 155 166 208 315    Basic Metabolic Panel: Recent Labs  Lab 06/28/21 0500 06/29/21 1254 06/30/21 0522 07/01/21 0011 07/02/21 0142  NA 134* 140 138 137 133*  K 3.8 3.9 3.9 3.4* 4.0  CL 100 96* 94* 95* 92*  CO2 30 34* 34* 35* 35*  GLUCOSE 242* 226* 175* 176* 187*  BUN 76* 31* 19 17 13   CREATININE 1.46* 0.96 0.80 0.86 1.02*  CALCIUM 7.5* 8.3* 8.1* 8.0* 7.8*   MG  --   --   --  1.8 1.6*    GFR: Estimated Creatinine Clearance: 46.1 mL/min (A) (by C-G formula based on SCr of 1.02 mg/dL (H)). Liver Function Tests: Recent Labs  Lab 06/26/21 0847 06/28/21 0500 06/29/21 1254 06/29/21 1608 06/30/21 0522  AST 16 13* 14* 13* 11*  ALT 11 12 11 12 9   ALKPHOS 137* 123 126 122 99  BILITOT 1.9* 1.0 1.1 0.9 0.9  PROT 6.6 6.3* 6.4* 6.8 5.8*  ALBUMIN 2.3* 2.4* 2.3* 2.3* 2.0*    No results for input(s): LIPASE, AMYLASE in the last 168 hours.  No results for input(s): AMMONIA in the last 168 hours. Coagulation Profile: No results for input(s): INR, PROTIME in the last 168 hours.  Cardiac Enzymes: No results for input(s): CKTOTAL, CKMB, CKMBINDEX, TROPONINI in the last 168 hours. BNP (last 3 results) No results for input(s): PROBNP in the last 8760 hours. HbA1C: No results for input(s): HGBA1C in the last 72 hours. CBG: Recent Labs  Lab 07/01/21 1120 07/01/21 1625 07/01/21 2119 07/02/21 0620 07/02/21 1123  GLUCAP 156* 198* 210* 165* 165*    Lipid Profile: No results for input(s): CHOL, HDL, LDLCALC, TRIG, CHOLHDL, LDLDIRECT in the last 72 hours. Thyroid Function Tests: No results for input(s): TSH, T4TOTAL, FREET4, T3FREE, THYROIDAB in the last 72 hours.  Anemia Panel: No results for input(s): VITAMINB12, FOLATE, FERRITIN, TIBC, IRON, RETICCTPCT in the last 72 hours. Sepsis Labs: Recent Labs  Lab 06/25/21 1350 06/26/21 0847 06/27/21 0828 06/29/21 1254 06/30/21 0522 07/01/21 0011  PROCALCITON  --    < > 5.80 0.59 0.61 0.55  LATICACIDVEN 1.0  --   --   --   --   --    < > = values in this interval not displayed.     Recent Results (from the past 240 hour(s))  Blood culture (routine x 2)     Status: Abnormal   Collection Time: 06/25/21  2:33 AM   Specimen: BLOOD RIGHT FOREARM  Result Value Ref Range Status   Specimen Description   Final    BLOOD RIGHT FOREARM Performed at [name], [address],  [place], [place] [address]    Special Requests   Final    BOTTLES DRAWN AEROBIC AND ANAEROBIC Blood Culture results may not be optimal due to an inadequate volume of blood received in culture bottles Performed at [facility], [address], [place], [place] [address]    Culture  Setup Time   Final    GRAM POSITIVE COCCI IN BOTH AEROBIC AND ANAEROBIC BOTTLES Organism ID to follow CRITICAL RESULT CALLED TO, READ BACK  BY AND VERIFIED WITHArdeen Garland PHARMD [id number] 06/25/21 HNM Performed at [facility], [place], [place] [address]    Culture STREPTOCOCCUS GROUP G (A)  Final   Report Status 06/28/2021 FINAL  Final   Organism ID, Bacteria STREPTOCOCCUS GROUP G  Final      Susceptibility   Streptococcus group g - MIC*    CLINDAMYCIN RESISTANT Resistant     AMPICILLIN <=0.25 SENSITIVE Sensitive     ERYTHROMYCIN 2 RESISTANT Resistant     VANCOMYCIN 0.5 SENSITIVE Sensitive     CEFTRIAXONE <=0.12 SENSITIVE Sensitive     LEVOFLOXACIN 0.5 SENSITIVE Sensitive     PENICILLIN Value in next row Sensitive      SENSITIVE0.06    * STREPTOCOCCUS GROUP G  Blood Culture ID Panel (Reflexed)     Status: Abnormal   Collection Time: 06/25/21  2:33 AM  Result Value Ref Range Status   Enterococcus faecalis NOT DETECTED NOT DETECTED Final   Enterococcus Faecium NOT DETECTED NOT DETECTED Final   Listeria monocytogenes NOT DETECTED NOT DETECTED Final   Staphylococcus species NOT DETECTED NOT DETECTED Final   Staphylococcus aureus (BCID) NOT DETECTED NOT DETECTED Final   Staphylococcus epidermidis NOT DETECTED NOT DETECTED Final   Staphylococcus lugdunensis NOT DETECTED NOT DETECTED Final   Streptococcus species DETECTED (A) NOT DETECTED Final    Comment: Not Enterococcus species, Streptococcus agalactiae, Streptococcus pyogenes, or Streptococcus pneumoniae. CRITICAL RESULT CALLED TO, READ BACK BY AND VERIFIED WITH: Walters [id number] 06/25/21 HNM    Streptococcus  agalactiae NOT DETECTED NOT DETECTED Final   Streptococcus pneumoniae NOT DETECTED NOT DETECTED Final   Streptococcus pyogenes NOT DETECTED NOT DETECTED Final   A.calcoaceticus-baumannii NOT DETECTED NOT DETECTED Final   Bacteroides fragilis NOT DETECTED NOT DETECTED Final   Enterobacterales NOT DETECTED NOT DETECTED Final   Enterobacter cloacae complex NOT DETECTED NOT DETECTED Final   Escherichia coli NOT DETECTED NOT DETECTED Final   Klebsiella aerogenes NOT DETECTED NOT DETECTED Final   Klebsiella oxytoca NOT DETECTED NOT DETECTED Final   Klebsiella pneumoniae NOT DETECTED NOT DETECTED Final   Proteus species NOT DETECTED NOT DETECTED Final   Salmonella species NOT DETECTED NOT DETECTED Final   Serratia marcescens NOT DETECTED NOT DETECTED Final   Haemophilus influenzae NOT DETECTED NOT DETECTED Final   Neisseria meningitidis NOT DETECTED NOT DETECTED Final   Pseudomonas aeruginosa NOT DETECTED NOT DETECTED Final   Stenotrophomonas maltophilia NOT DETECTED NOT DETECTED Final   Candida albicans NOT DETECTED NOT DETECTED Final   Candida auris NOT DETECTED NOT DETECTED Final   Candida glabrata NOT DETECTED NOT DETECTED Final   Candida krusei NOT DETECTED NOT DETECTED Final   Candida parapsilosis NOT DETECTED NOT DETECTED Final   Candida tropicalis NOT DETECTED NOT DETECTED Final   Cryptococcus neoformans/gattii NOT DETECTED NOT DETECTED Final    Comment: Performed at [facility], [address] [place], [place] [address]  Resp Panel by RT-PCR (Flu A&B, Covid) Nasopharyngeal Swab     Status: None   Collection Time: 06/25/21  3:33 AM   Specimen: Nasopharyngeal Swab; Nasopharyngeal(NP) swabs in vial transport medium  Result Value Ref Range Status   SARS Coronavirus 2 by RT PCR NEGATIVE NEGATIVE Final    Comment: (NOTE) SARS-CoV-2 target nucleic acids are NOT DETECTED.  The SARS-CoV-2 RNA is generally detectable in upper respiratory specimens during the acute phase of  infection. The lowest concentration of SARS-CoV-2 viral copies this assay can detect is 138 copies/mL. A  negative result does not preclude SARS-Cov-2 infection and should not be used as the sole basis for treatment or other patient management decisions. A negative result may occur with  improper specimen collection/handling, submission of specimen other than nasopharyngeal swab, presence of viral mutation(s) within the areas targeted by this assay, and inadequate number of viral copies(<138 copies/mL). A negative result must be combined with clinical observations, patient history, and epidemiological information. The expected result is Negative.  Fact Sheet for Patients:  EntrepreneurPulse.com.au  Fact Sheet for Healthcare Providers:  IncredibleEmployment.be  This test is no t yet approved or cleared by the [place] FDA and  has been authorized for detection and/or diagnosis of SARS-CoV-2 by FDA under an Emergency Use Authorization (EUA). This EUA will remain  in effect (meaning this test can be used) for the duration of the COVID-19 declaration under Section 564(b)(1) of the Act, 21 U.S.C.section 360bbb-3(b)(1), unless the authorization is terminated  or revoked sooner.       Influenza A by PCR NEGATIVE NEGATIVE Final   Influenza B by PCR NEGATIVE NEGATIVE Final    Comment: (NOTE) The Xpert Xpress SARS-CoV-2/FLU/RSV plus assay is intended as an aid in the diagnosis of influenza from Nasopharyngeal swab specimens and should not be used as a sole basis for treatment. Nasal washings and aspirates are unacceptable for Xpert Xpress SARS-CoV-2/FLU/RSV testing.  Fact Sheet for Patients: EntrepreneurPulse.com.au  Fact Sheet for Healthcare Providers: IncredibleEmployment.be  This test is not yet approved or cleared by the [place] FDA and has been authorized for detection and/or diagnosis of SARS-CoV-2  by FDA under an Emergency Use Authorization (EUA). This EUA will remain in effect (meaning this test can be used) for the duration of the COVID-19 declaration under Section 564(b)(1) of the Act, 21 U.S.C. section 360bbb-3(b)(1), unless the authorization is terminated or revoked.  Performed at [facility], [address] [place], [place] [address]   Blood culture (routine x 2)     Status: Abnormal   Collection Time: 06/25/21  5:55 AM   Specimen: BLOOD  Result Value Ref Range Status   Specimen Description   Final    BLOOD LEFT ARM Performed at [facility], [address] [place], [place] [address]    Special Requests   Final    BOTTLES DRAWN AEROBIC AND ANAEROBIC Blood Culture adequate volume Performed at [facility], [address], [place], [place] [address]    Culture  Setup Time   Final    GRAM POSITIVE COCCI IN BOTH AEROBIC AND ANAEROBIC BOTTLES CRITICAL VALUE NOTED.  VALUE IS CONSISTENT WITH PREVIOUSLY REPORTED AND CALLED VALUE. Performed at [facility], [address] [place], [place] [address]    Culture (A)  Final    STREPTOCOCCUS GROUP G SUSCEPTIBILITIES PERFORMED ON PREVIOUS CULTURE WITHIN THE LAST 5 DAYS. Performed at [facility], [place] [address] [place], [place] [address]    Report Status 06/28/2021 FINAL  Final  Urine Culture     Status: Abnormal   Collection Time: 06/25/21  6:55 AM   Specimen: Urine, Clean Catch  Result Value Ref Range Status   Specimen Description   Final    URINE, CLEAN CATCH Performed at [facility], [address] [place], [place] [address]    Special Requests   Final    NONE Performed at [facility], [address], [place], [place] [address]    Culture >=100,000 COLONIES/mL KLEBSIELLA PNEUMONIAE (A)  Final   Report  Status 06/27/2021 FINAL  Final   Organism ID, Bacteria KLEBSIELLA PNEUMONIAE (A)  Final      Susceptibility   Klebsiella pneumoniae - MIC*     AMPICILLIN RESISTANT Resistant     CEFAZOLIN <=4 SENSITIVE Sensitive     CEFEPIME <=0.12 SENSITIVE Sensitive     CEFTRIAXONE <=0.25 SENSITIVE Sensitive     CIPROFLOXACIN <=0.25 SENSITIVE Sensitive     GENTAMICIN <=1 SENSITIVE Sensitive     IMIPENEM <=0.25 SENSITIVE Sensitive     NITROFURANTOIN 32 SENSITIVE Sensitive     TRIMETH/SULFA <=20 SENSITIVE Sensitive     AMPICILLIN/SULBACTAM <=2 SENSITIVE Sensitive     PIP/TAZO <=4 SENSITIVE Sensitive     * >=100,000 COLONIES/mL KLEBSIELLA PNEUMONIAE  C Difficile Quick Screen w PCR reflex     Status: None   Collection Time: 06/25/21  6:06 PM   Specimen: Stool  Result Value Ref Range Status   C Diff antigen NEGATIVE NEGATIVE Final   C Diff toxin NEGATIVE NEGATIVE Final   C Diff interpretation No C. difficile detected.  Final    Comment: Performed at [facility], [address] [place], [place] [address]  MRSA Next Gen by PCR, Nasal     Status: None   Collection Time: 06/25/21 10:55 PM   Specimen: Nasal Mucosa; Nasal Swab  Result Value Ref Range Status   MRSA by PCR Next Gen NOT DETECTED NOT DETECTED Final    Comment: (NOTE) [name] (FDA approved for NASAL specimens only), is one component of a comprehensive MRSA colonization surveillance program. It is not intended to diagnose MRSA infection nor to guide or monitor treatment for MRSA infections. Test performance is not FDA approved in patients less than 11 years old. Performed at [facility], [address] [place], [place] [address]   Culture, blood (Routine X 2) w Reflex to ID Panel     Status: None   Collection Time: 06/27/21  8:29 AM   Specimen: BLOOD RIGHT HAND  Result Value Ref Range Status   Specimen Description BLOOD RIGHT HAND  Final   Special Requests   Final    BOTTLES DRAWN AEROBIC AND ANAEROBIC Blood Culture adequate volume   Culture   Final    NO GROWTH 5 DAYS Performed at [facility], [address] [place],  [place] [address]    Report Status 07/02/2021 FINAL  Final  Culture, blood (Routine X 2) w Reflex to ID Panel     Status: None   Collection Time: 06/27/21  8:29 AM   Specimen: BLOOD RIGHT HAND  Result Value Ref Range Status   Specimen Description BLOOD RIGHT HAND  Final   Special Requests   Final    BOTTLES DRAWN AEROBIC ONLY Blood Culture adequate volume   Culture   Final    NO GROWTH 5 DAYS Performed at [facility], [address], [place],  [address]    Report Status 07/02/2021 FINAL  Final         Radiology Studies: No results found.      Scheduled Meds:  apixaban  5 mg Oral BID   Difluprednate  1 drop Both Eyes BID   digoxin  0.125 mg Oral Daily   diltiazem  30 mg Oral Q6H   DULoxetine  60 mg Oral Daily   gabapentin  600 mg Oral TID   insulin aspart  0-9 Units Subcutaneous TID WC   macitentan  10 mg Oral Daily   pantoprazole (PROTONIX) IV  40 mg Intravenous Q24H   polyethylene glycol  17 g Oral BID   rosuvastatin  10 mg Oral Daily   Selexipag  1,600 mcg Oral BID   sildenafil  20 mg Oral TID   sodium chloride flush  3 mL Intravenous Q12H   spironolactone  25 mg Oral Daily   torsemide  40 mg Oral BID   Continuous Infusions:  sodium chloride     amiodarone 30 mg/hr (07/02/21 0920)   cefTRIAXone (ROCEPHIN)  IV 2 g (07/02/21 1234)     LOS: 3 days    Time spent: 35 minutes.     Elmarie Shiley, MD [facility]   If 7PM-7AM, please contact night-coverage [email or web address]  07/02/2021, 1:35 PM

## 2021-07-02 NOTE — Progress Notes (Signed)
[facility]  Date of Admission:  06/29/2021     Total days of antibiotics 9         ASSESSMENT:  Debra Saunders is not a candidate for surgical extraction of her pacemaker and leads secondary to risk with multiple co-morbidities. Will need 6 weeks of IV therapy followed by suppression secondary to retained infected device. Has allergy to Penicillins noted and will arrange for Penicillin Allergy Skin testing to aid in antibiotic selection. Tolerating ceftriaxone at present and will continue. Will await results of allergy testing prior to final recommendations. Blood cultures finalized without growth and okay for PICC line at any time. Remaining medical care per Primary team and Cardiology.    PLAN:  Continue ceftriaxone.  Arrange for penicillin allergy testing. Will need prolonged course of antibiotic treatment followed by suppression.  Remaining medical and supportive care per primary team and Cardiology.  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  Active Problems:   Hypertension   Diabetes ([facility])   Pulmonary hypertension, unspecified (HCC)   Hyperlipidemia   OSA (obstructive sleep apnea)   Acute on chronic respiratory failure with hypoxia (HCC)   Atrial fibrillation with tachy brady syndrome    Chronic diastolic CHF (congestive heart failure) (HCC)   Acute renal failure superimposed on stage 3a chronic kidney disease (HCC)   DVT (deep venous thrombosis) (HCC)   Acute blood loss anemia   klebsiella pneumoniae UTI   Pressure injury of skin    apixaban  5 mg Oral BID   Difluprednate  1 drop Both Eyes BID   digoxin  0.125 mg Oral Daily   diltiazem  30 mg Oral Q6H   DULoxetine  60 mg Oral Daily   gabapentin  600 mg Oral TID   insulin aspart  0-9 Units Subcutaneous TID WC   macitentan  10 mg Oral Daily   pantoprazole (PROTONIX) IV  40 mg Intravenous Q24H   polyethylene glycol  17 g  Oral BID   rosuvastatin  10 mg Oral Daily   Selexipag  1,600 mcg Oral BID   sildenafil  20 mg Oral TID   sodium chloride flush  3 mL Intravenous Q12H   spironolactone  25 mg Oral Daily   torsemide  40 mg Oral BID    SUBJECTIVE:  Afebrile overnight with no acute events. Waiting for eye drops and have CPAP removed. Denies fevers/chills.   Allergies  Allergen Reactions   Aspirin Hives and Other (See Comments)    Pt states that she is unable to take because she had bleeding in her brain   Other Other (See Comments)    Pt states that she is unable to take blood thinners because she had bleeding in her brain; Blood Thinners-per doctor at [facility] (Xarelto)   Penicillins Hives, Shortness Of Breath, Swelling and Other (See Comments)    Has patient had a PCN reaction causing immediate rash, facial/tongue/throat swelling, SOB or lightheadedness with hypotension: Yes Has patient had a PCN reaction causing severe rash involving mucus membranes or skin necrosis: No Has patient had a PCN reaction that required hospitalization No Has patient had a PCN reaction occurring within the last 10 years: No If all of the above answers are "NO", then may proceed with Cephalosporin use.     Review of Systems: Review of Systems  Constitutional:  Negative for chills, fever and weight loss.  Respiratory:  Negative for cough, shortness  of breath and wheezing.   Cardiovascular:  Negative for chest pain and leg swelling.  Gastrointestinal:  Negative for abdominal pain, constipation, diarrhea, nausea and vomiting.  Skin:  Negative for rash.     OBJECTIVE: Vitals:   07/02/21 0504 07/02/21 0638 07/02/21 0910 07/02/21 1134  BP: 115/61 115/61  (!) 125/57  Pulse: 84  78   Resp: 20     Temp: 97.7 F (36.5 C)     TempSrc: Oral     SpO2: 90%     Weight:      Height:       Body mass index is 39.74 kg/m.  Physical Exam Constitutional:      General: She is not in acute distress.    Appearance: She is  well-developed.  Cardiovascular:     Rate and Rhythm: Normal rate and regular rhythm.     Heart sounds: Normal heart sounds.  Pulmonary:     Effort: Pulmonary effort is normal.     Breath sounds: Normal breath sounds.  Skin:    General: Skin is warm and dry.  Neurological:     Mental Status: She is alert and oriented to person, place, and time.    Lab Results Lab Results  Component Value Date   WBC 18.3 (H) 07/02/2021   HGB 8.0 (L) 07/02/2021   HCT 24.9 (L) 07/02/2021   MCV 84.7 07/02/2021   PLT 203 07/02/2021    Lab Results  Component Value Date   CREATININE 1.02 (H) 07/02/2021   BUN 13 07/02/2021   NA 133 (L) 07/02/2021   K 4.0 07/02/2021   CL 92 (L) 07/02/2021   CO2 35 (H) 07/02/2021    Lab Results  Component Value Date   ALT 9 06/30/2021   AST 11 (L) 06/30/2021   ALKPHOS 99 06/30/2021   BILITOT 0.9 06/30/2021     Microbiology: Recent Results (from the past 240 hour(s))  Blood culture (routine x 2)     Status: Abnormal   Collection Time: 06/25/21  2:33 AM   Specimen: BLOOD RIGHT FOREARM  Result Value Ref Range Status   Specimen Description   Final    BLOOD RIGHT FOREARM Performed at [facility], [address] [place], [place] [address]    Special Requests   Final    BOTTLES DRAWN AEROBIC AND ANAEROBIC Blood Culture results may not be optimal due to an inadequate volume of blood received in culture bottles Performed at [facility], [address] [place], [place] [address]    Culture  Setup Time   Final    GRAM POSITIVE COCCI IN BOTH AEROBIC AND ANAEROBIC BOTTLES Organism ID to follow CRITICAL RESULT CALLED TO, READ BACK BY AND VERIFIED WITHArdeen Garland PHARMD [id number] 06/25/21 HNM Performed at [facility], [address] [place], [place] [address]    Culture STREPTOCOCCUS GROUP G (A)  Final   Report Status 06/28/2021 FINAL  Final   Organism ID, Bacteria STREPTOCOCCUS GROUP G  Final      Susceptibility   Streptococcus  group g - MIC*    CLINDAMYCIN RESISTANT Resistant     AMPICILLIN <=0.25 SENSITIVE Sensitive     ERYTHROMYCIN 2 RESISTANT Resistant     VANCOMYCIN 0.5 SENSITIVE Sensitive     CEFTRIAXONE <=0.12 SENSITIVE Sensitive     LEVOFLOXACIN 0.5 SENSITIVE Sensitive     PENICILLIN Value in next row Sensitive      SENSITIVE0.06    * STREPTOCOCCUS GROUP G  Blood Culture  ID Panel (Reflexed)     Status: Abnormal   Collection Time: 06/25/21  2:33 AM  Result Value Ref Range Status   Enterococcus faecalis NOT DETECTED NOT DETECTED Final   Enterococcus Faecium NOT DETECTED NOT DETECTED Final   Listeria monocytogenes NOT DETECTED NOT DETECTED Final   Staphylococcus species NOT DETECTED NOT DETECTED Final   Staphylococcus aureus (BCID) NOT DETECTED NOT DETECTED Final   Staphylococcus epidermidis NOT DETECTED NOT DETECTED Final   Staphylococcus lugdunensis NOT DETECTED NOT DETECTED Final   Streptococcus species DETECTED (A) NOT DETECTED Final    Comment: Not Enterococcus species, Streptococcus agalactiae, Streptococcus pyogenes, or Streptococcus pneumoniae. CRITICAL RESULT CALLED TO, READ BACK BY AND VERIFIED WITH: [place] [id number] 06/25/21 HNM    Streptococcus agalactiae NOT DETECTED NOT DETECTED Final   Streptococcus pneumoniae NOT DETECTED NOT DETECTED Final   Streptococcus pyogenes NOT DETECTED NOT DETECTED Final   A.calcoaceticus-baumannii NOT DETECTED NOT DETECTED Final   Bacteroides fragilis NOT DETECTED NOT DETECTED Final   Enterobacterales NOT DETECTED NOT DETECTED Final   Enterobacter cloacae complex NOT DETECTED NOT DETECTED Final   Escherichia coli NOT DETECTED NOT DETECTED Final   Klebsiella aerogenes NOT DETECTED NOT DETECTED Final   Klebsiella oxytoca NOT DETECTED NOT DETECTED Final   Klebsiella pneumoniae NOT DETECTED NOT DETECTED Final   Proteus species NOT DETECTED NOT DETECTED Final   Salmonella species NOT DETECTED NOT DETECTED Final   Serratia marcescens NOT DETECTED NOT  DETECTED Final   Haemophilus influenzae NOT DETECTED NOT DETECTED Final   Neisseria meningitidis NOT DETECTED NOT DETECTED Final   Pseudomonas aeruginosa NOT DETECTED NOT DETECTED Final   Stenotrophomonas maltophilia NOT DETECTED NOT DETECTED Final   Candida albicans NOT DETECTED NOT DETECTED Final   Candida auris NOT DETECTED NOT DETECTED Final   Candida glabrata NOT DETECTED NOT DETECTED Final   Candida krusei NOT DETECTED NOT DETECTED Final   Candida parapsilosis NOT DETECTED NOT DETECTED Final   Candida tropicalis NOT DETECTED NOT DETECTED Final   Cryptococcus neoformans/gattii NOT DETECTED NOT DETECTED Final    Comment: Performed at [facility], [address] [place], [place] [address]  Resp Panel by RT-PCR (Flu A&B, Covid) Nasopharyngeal Swab     Status: None   Collection Time: 06/25/21  3:33 AM   Specimen: Nasopharyngeal Swab; Nasopharyngeal(NP) swabs in vial transport medium  Result Value Ref Range Status   SARS Coronavirus 2 by RT PCR NEGATIVE NEGATIVE Final    Comment: (NOTE) SARS-CoV-2 target nucleic acids are NOT DETECTED.  The SARS-CoV-2 RNA is generally detectable in upper respiratory specimens during the acute phase of infection. The lowest concentration of SARS-CoV-2 viral copies this assay can detect is 138 copies/mL. A negative result does not preclude SARS-Cov-2 infection and should not be used as the sole basis for treatment or other patient management decisions. A negative result may occur with  improper specimen collection/handling, submission of specimen other than nasopharyngeal swab, presence of viral mutation(s) within the areas targeted by this assay, and inadequate number of viral copies(<138 copies/mL). A negative result must be combined with clinical observations, patient history, and epidemiological information. The expected result is Negative.  Fact Sheet for Patients:  EntrepreneurPulse.com.au  Fact Sheet for  Healthcare Providers:  IncredibleEmployment.be  This test is no t yet approved or cleared by the [place] FDA and  has been authorized for detection and/or diagnosis of SARS-CoV-2 by FDA under an Emergency Use Authorization (EUA). This EUA will remain  in effect (meaning this  test can be used) for the duration of the COVID-19 declaration under Section 564(b)(1) of the Act, 21 U.S.C.section 360bbb-3(b)(1), unless the authorization is terminated  or revoked sooner.       Influenza A by PCR NEGATIVE NEGATIVE Final   Influenza B by PCR NEGATIVE NEGATIVE Final    Comment: (NOTE) The Xpert Xpress SARS-CoV-2/FLU/RSV plus assay is intended as an aid in the diagnosis of influenza from Nasopharyngeal swab specimens and should not be used as a sole basis for treatment. Nasal washings and aspirates are unacceptable for Xpert Xpress SARS-CoV-2/FLU/RSV testing.  Fact Sheet for Patients: EntrepreneurPulse.com.au  Fact Sheet for Healthcare Providers: IncredibleEmployment.be  This test is not yet approved or cleared by the [place] FDA and has been authorized for detection and/or diagnosis of SARS-CoV-2 by FDA under an Emergency Use Authorization (EUA). This EUA will remain in effect (meaning this test can be used) for the duration of the COVID-19 declaration under Section 564(b)(1) of the Act, 21 U.S.C. section 360bbb-3(b)(1), unless the authorization is terminated or revoked.  Performed at [facility], [address] [place], [place] [address]   Blood culture (routine x 2)     Status: Abnormal   Collection Time: 06/25/21  5:55 AM   Specimen: BLOOD  Result Value Ref Range Status   Specimen Description   Final    BLOOD LEFT ARM Performed at [facility], [address], [place], [place] [address]    Special Requests   Final    BOTTLES DRAWN AEROBIC AND ANAEROBIC Blood Culture adequate volume Performed  at [facility], [address] [place], [place] [address]    Culture  Setup Time   Final    GRAM POSITIVE COCCI IN BOTH AEROBIC AND ANAEROBIC BOTTLES CRITICAL VALUE NOTED.  VALUE IS CONSISTENT WITH PREVIOUSLY REPORTED AND CALLED VALUE. Performed at [facility], [address] [place], [place] [address]    Culture (A)  Final    STREPTOCOCCUS GROUP G SUSCEPTIBILITIES PERFORMED ON PREVIOUS CULTURE WITHIN THE LAST 5 DAYS. Performed at [name], [place] [address], [place], [place] [address]    Report Status 06/28/2021 FINAL  Final  Urine Culture     Status: Abnormal   Collection Time: 06/25/21  6:55 AM   Specimen: Urine, Clean Catch  Result Value Ref Range Status   Specimen Description   Final    URINE, CLEAN CATCH Performed at [facility], [address] [place], [place] [address]    Special Requests   Final    NONE Performed at [facility], [place], [place] [address]    Culture >=100,000 COLONIES/mL KLEBSIELLA PNEUMONIAE (A)  Final   Report Status 06/27/2021 FINAL  Final   Organism ID, Bacteria KLEBSIELLA PNEUMONIAE (A)  Final      Susceptibility   Klebsiella pneumoniae - MIC*    AMPICILLIN RESISTANT Resistant     CEFAZOLIN <=4 SENSITIVE Sensitive     CEFEPIME <=0.12 SENSITIVE Sensitive     CEFTRIAXONE <=0.25 SENSITIVE Sensitive     CIPROFLOXACIN <=0.25 SENSITIVE Sensitive     GENTAMICIN <=1 SENSITIVE Sensitive     IMIPENEM <=0.25 SENSITIVE Sensitive     NITROFURANTOIN 32 SENSITIVE Sensitive     TRIMETH/SULFA <=20 SENSITIVE Sensitive     AMPICILLIN/SULBACTAM <=2 SENSITIVE Sensitive     PIP/TAZO <=4 SENSITIVE Sensitive     * >=100,000 COLONIES/mL KLEBSIELLA PNEUMONIAE  C Difficile Quick Screen w PCR reflex     Status:  None   Collection Time: 06/25/21  6:06 PM   Specimen: Stool  Result Value Ref Range Status   C Diff antigen NEGATIVE NEGATIVE Final   C Diff toxin NEGATIVE NEGATIVE Final   C Diff  interpretation No C. difficile detected.  Final    Comment: Performed at [facility], [address] [place], [place] [address]  MRSA Next Gen by PCR, Nasal     Status: None   Collection Time: 06/25/21 10:55 PM   Specimen: Nasal Mucosa; Nasal Swab  Result Value Ref Range Status   MRSA by PCR Next Gen NOT DETECTED NOT DETECTED Final    Comment: (NOTE) [name] (FDA approved for NASAL specimens only), is one component of a comprehensive MRSA colonization surveillance program. It is not intended to diagnose MRSA infection nor to guide or monitor treatment for MRSA infections. Test performance is not FDA approved in patients less than 16 years old. Performed at [name], [address] [place], [place] [address]   Culture, blood (Routine X 2) w Reflex to ID Panel     Status: None   Collection Time: 06/27/21  8:29 AM   Specimen: BLOOD RIGHT HAND  Result Value Ref Range Status   Specimen Description BLOOD RIGHT HAND  Final   Special Requests   Final    BOTTLES DRAWN AEROBIC AND ANAEROBIC Blood Culture adequate volume   Culture   Final    NO GROWTH 5 DAYS Performed at [facility], [address] [place], [place] [address]    Report Status 07/02/2021 FINAL  Final  Culture, blood (Routine X 2) w Reflex to ID Panel     Status: None   Collection Time: 06/27/21  8:29 AM   Specimen: BLOOD RIGHT HAND  Result Value Ref Range Status   Specimen Description BLOOD RIGHT HAND  Final   Special Requests   Final    BOTTLES DRAWN AEROBIC ONLY Blood Culture adequate volume   Culture   Final    NO GROWTH 5 DAYS Performed at [facility], [address], [place], [place] [address]    Report Status 07/02/2021 FINAL  Final     Terri Piedra, NP [facility]  07/02/2021  11:42 AM

## 2021-07-02 NOTE — Progress Notes (Signed)
Pt placed on CPAP with 3 lpm nasal cannula bled in.  Tolerating well.

## 2021-07-03 ENCOUNTER — Inpatient Hospital Stay (HOSPITAL_COMMUNITY): Payer: Medicare HMO

## 2021-07-03 DIAGNOSIS — N390 Urinary tract infection, site not specified: Secondary | ICD-10-CM

## 2021-07-03 DIAGNOSIS — D62 Acute posthemorrhagic anemia: Secondary | ICD-10-CM

## 2021-07-03 LAB — CBC
HCT: 26.3 % — ABNORMAL LOW (ref 36.0–46.0)
Hemoglobin: 8 g/dL — ABNORMAL LOW (ref 12.0–15.0)
MCH: 26.4 pg (ref 26.0–34.0)
MCHC: 30.4 g/dL (ref 30.0–36.0)
MCV: 86.8 fL (ref 80.0–100.0)
Platelets: 196 10*3/uL (ref 150–400)
RBC: 3.03 MIL/uL — ABNORMAL LOW (ref 3.87–5.11)
RDW: 14.9 % (ref 11.5–15.5)
WBC: 19.6 10*3/uL — ABNORMAL HIGH (ref 4.0–10.5)
nRBC: 0 % (ref 0.0–0.2)

## 2021-07-03 LAB — GLUCOSE, CAPILLARY
Glucose-Capillary: 165 mg/dL — ABNORMAL HIGH (ref 70–99)
Glucose-Capillary: 158 mg/dL — ABNORMAL HIGH (ref 70–99)
Glucose-Capillary: 165 mg/dL — ABNORMAL HIGH (ref 70–99)
Glucose-Capillary: 177 mg/dL — ABNORMAL HIGH (ref 70–99)

## 2021-07-03 LAB — BASIC METABOLIC PANEL
Anion gap: 7 (ref 5–15)
BUN: 14 mg/dL (ref 8–23)
CO2: 32 mmol/L (ref 22–32)
Calcium: 7.9 mg/dL — ABNORMAL LOW (ref 8.9–10.3)
Chloride: 92 mmol/L — ABNORMAL LOW (ref 98–111)
Creatinine, Ser: 1.27 mg/dL — ABNORMAL HIGH (ref 0.44–1.00)
GFR, Estimated: 43 mL/min — ABNORMAL LOW (ref >60)
Glucose, Bld: 191 mg/dL — ABNORMAL HIGH (ref 70–99)
Potassium: 4.2 mmol/L (ref 3.5–5.1)
Sodium: 131 mmol/L — ABNORMAL LOW (ref 135–145)

## 2021-07-03 LAB — MAGNESIUM: Magnesium: 2.1 mg/dL (ref 1.7–2.4)

## 2021-07-03 MED ORDER — SODIUM CHLORIDE 0.9 % IV SOLN: INTRAVENOUS | Status: DC

## 2021-07-03 MED ORDER — SODIUM CHLORIDE 0.9% FLUSH
0.1000 mL | Freq: Once | INTRAVENOUS | Status: AC
  Administered 2021-07-03: 0.1 mL via TOPICAL

## 2021-07-03 MED ORDER — CAPSAICIN 0.025 % EX CREA
TOPICAL_CREAM | Freq: Two times a day (BID) | CUTANEOUS | Status: AC
  Filled 2021-07-03: qty 60

## 2021-07-03 MED ORDER — SODIUM CHLORIDE 0.9% FLUSH
10.0000 mL | Freq: Two times a day (BID) | INTRAVENOUS | Status: DC
  Administered 2021-07-03 – 2021-07-11 (×12): 10 mL
  Administered 2021-07-12: 20 mL
  Administered 2021-07-12: 10 mL

## 2021-07-03 MED ORDER — HISTAMINE PHOSPHATE 2.75 MG/ML IJ SOLN
0.1000 mL | Freq: Once | INTRAMUSCULAR | Status: AC
  Administered 2021-07-03: 0.1 mL via TOPICAL
  Filled 2021-07-03: qty 0.1

## 2021-07-03 MED ORDER — BENZYLPENICILLOYL POLYLYSINE 0.25 ML ID SOLN
0.0600 mL | Freq: Once | INTRADERMAL | Status: AC
  Administered 2021-07-03: 0.06 mL via TOPICAL
  Filled 2021-07-03: qty 0.06

## 2021-07-03 MED ORDER — CHLORHEXIDINE GLUCONATE CLOTH 2 % EX PADS
6.0000 | MEDICATED_PAD | Freq: Every day | CUTANEOUS | Status: DC
  Administered 2021-07-03 – 2021-07-12 (×9): 6 via TOPICAL

## 2021-07-03 MED ORDER — TORSEMIDE 20 MG PO TABS
40.0000 mg | ORAL_TABLET | Freq: Every day | ORAL | Status: DC
  Administered 2021-07-03: 40 mg via ORAL
  Filled 2021-07-03: qty 2

## 2021-07-03 MED ORDER — DIPHENHYDRAMINE HCL 50 MG/ML IJ SOLN: 25.0000 mg | Freq: Once | INTRAMUSCULAR | Status: DC | PRN

## 2021-07-03 MED ORDER — AMOXICILLIN 250 MG PO CAPS
250.0000 mg | ORAL_CAPSULE | Freq: Once | ORAL | Status: AC
  Administered 2021-07-03: 250 mg via ORAL
  Filled 2021-07-03: qty 1

## 2021-07-03 MED ORDER — EPINEPHRINE 0.3 MG/0.3ML IJ SOAJ
0.3000 mg | Freq: Once | INTRAMUSCULAR | Status: DC | PRN
  Filled 2021-07-03: qty 0.6

## 2021-07-03 MED ORDER — PENICILLIN G IN SODIUM CHLORIDE 10,000 UNITS/ML VIAL FOR SKIN TEST (NO CHARGE)
1000.0000 [IU] | Freq: Once | INTRADERMAL | Status: AC
  Administered 2021-07-03: 1000 [IU] via INTRADERMAL
  Filled 2021-07-03: qty 1

## 2021-07-03 MED ORDER — SODIUM CHLORIDE 0.9% FLUSH
0.1000 mL | Freq: Once | INTRAVENOUS | Status: AC
  Administered 2021-07-03: 0.1 mL via INTRADERMAL

## 2021-07-03 MED ORDER — PENICILLIN G IN SODIUM CHLORIDE 10,000 UNITS/ML VIAL FOR SKIN TEST
1000.0000 [IU] | Freq: Once | INTRADERMAL | Status: AC
  Administered 2021-07-03: 1000 [IU] via TOPICAL
  Filled 2021-07-03: qty 1

## 2021-07-03 MED ORDER — BENZYLPENICILLOYL POLYLYSINE 0.25 ML ID SOLN (NO-CHARGE)
0.1000 mL | Freq: Once | INTRADERMAL | Status: AC
  Administered 2021-07-03: 0.1 mL via INTRADERMAL
  Filled 2021-07-03: qty 0.25

## 2021-07-03 MED ORDER — TORSEMIDE 20 MG PO TABS
20.0000 mg | ORAL_TABLET | Freq: Every evening | ORAL | Status: DC
  Administered 2021-07-03: 20 mg via ORAL
  Filled 2021-07-03: qty 1

## 2021-07-03 MED ORDER — CYCLOPENTOLATE HCL 2 % OP SOLN
1.0000 [drp] | Freq: Every day | OPHTHALMIC | Status: DC
  Administered 2021-07-03 – 2021-07-12 (×5): 1 [drp] via OPHTHALMIC

## 2021-07-03 NOTE — Progress Notes (Signed)
Penicillin skin test part 1- prick test

## 2021-07-03 NOTE — Assessment & Plan Note (Signed)
Pressure Injury 06/29/21 Right Stage 1 -  Intact skin with non-blanchable redness of a localized area usually over a bony prominence. abrasion on right gluteal fold (Active)  06/29/21 1500  Location:   Location Orientation: Right  Staging: Stage 1 -  Intact skin with non-blanchable redness of a localized area usually over a bony prominence.  Wound Description (Comments): abrasion on right gluteal fold  Present on Admission: Yes

## 2021-07-03 NOTE — Progress Notes (Addendum)
Penicillin Skin Testing Assessment  Penicillin allergy skin testing completed on 07/03/21  Penicillin allergy skin testing requested by: Dr. Laurice Record  Patient history of penicillin or beta lactam allergy: Per Ms. Stuck, she was treated with a penicillin antibiotic over 10 to 15 years ago for a UTI. When she took this antibiotic, she experienced throat and facial swelling. She did have some shortness of breath that required going to the hospital. Since the allergy occurred many years ago, Ms. Rickles is okay with completing penicillin skin testing.  Last use of antihistamine (or other drug affecting response to histamine): No noted drugs that would affect histamine response   Test Date Product Scratch Width (mm) Intradermal #1 Width (mm) Intradermal #2 Width (mm) Results (Pos/Neg/Ambiguous)  1/11 PRE-PEN (undiluted) 0 mm 1 mm 1 mm Neg   1/11 Penicillin G (10,000 U/mL) 0 mm 1 mm 1  mm Neg   1/11 Dilutent Control 0 mm 1 mm   Neg   1/11 Histamine (1mg /mL) 4 mm      Pos      Criteria for positive prick skin test: Induration > 23mm greater than diluent control Criteria for positive intradermal skin test: Significant increase in size of original bleb with wheal diameter 57mm or more larger than diluent control; itching and flare are commonly present Criteria for negative intradermal skin test: No increase in size of original bleb and no reaction greater than control site Equivocal intradermal skin test: Wheal only slightly larger than initial injection bleb and control site, with or without erythematous flare OR duplicates are discordant. Control site: If wheal >2-3 mm after 20 min, repeat skin test to look for dermatographism     Interpretation: [x]  NEGATIVE for penicillin allergy []  POSITIVE for penicillin allergy      Both portions of penicillin skin test have been negative. We will proceed with an amoxicillin oral challenge with the 250 mg dose. This was discussed with both the  patient and RN. We will give a dose of amoxicillin 250 mg and monitor her vitals every 15 minutes for 1 hour after the dose is given.   Jimmy Footman, PharmD, BCPS, BCIDP Infectious Diseases Clinical Pharmacist Phone: [phone] 07/03/2021 2:16 PM

## 2021-07-03 NOTE — Progress Notes (Signed)
Physical Therapy Treatment Patient Details Name: Debra Saunders MRN: [id number] DOB: 1945/12/18 Today's Date: 07/03/2021   History of Present Illness Pt is a 76 y.o. admitted 06/25/21 with c/o SOB, fever, chills, abdominal pain. Workup for septic shock secondary to klebsiella UTI and strep bacteremia with GIB, multifocal PNA, possible septic lung emboli. 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 Transfer to [facility] 06/29/21. Per cardiology, not a candidate for transvenous pacemaker lead extraction due to comorbidities. Pt developed afib with RVR; plan for TEE-guided DCCV 1/12. PMH includes HTN, afib, pacemaker, PAH, chronic resp failure (on 4L home O2), CHF, cardiorenal syndrome, DM2, OSA, stroke, ICH, chronic back pain, arthritis.   PT Comments    Pt slowly progressing with mobility; tolerates brief bouts of activity with intermittent minA; limited by significant fatigue and "it feels like I cannot get a good breath in." Pt adamant about return home at d/c, therefore recommending follow-up with HHPT services. Will continue to follow acutely to address established goals.  SpO2 91% on 4L O2 (pt's baseline) HR 70s BP 109/66    Recommendations for follow up therapy are one component of a multi-disciplinary discharge planning process, led by the attending physician.  Recommendations may be updated based on patient status, additional functional criteria and insurance authorization.  Follow Up Recommendations  Home health PT (declining SNF)     Assistance Recommended at Discharge Frequent or constant Supervision/Assistance  Patient can return home with the following A little help with walking and/or transfers;A little help with bathing/dressing/bathroom;Assistance with cooking/housework;Assist for transportation;Help with stairs or ramp for entrance   Equipment Recommendations  Rollator (4 wheels)    Recommendations for Other Services        Precautions / Restrictions Precautions Precautions: Fall;Other (comment) Precaution Comments: bladder/bowel incontinence, watch HR, wears 4L O2 baseline Restrictions Weight Bearing Restrictions: No     Mobility  Bed Mobility Overal bed mobility: Needs Assistance Bed Mobility: Supine to Sit;Sit to Supine     Supine to sit: Supervision;HOB elevated Sit to supine: Min assist   General bed mobility comments: Increased time and effort to sit EOB, requiring 2x seated rest breaks before coming to full sit EOB; minA for BLE management return tos upine    Transfers Overall transfer level: Needs assistance Equipment used: 1 person hand held assist;Rollator (4 wheels) Transfers: Bed to chair/wheelchair/BSC;Sit to/from Stand Sit to Stand: Min guard     Step pivot transfers: Min guard     General transfer comment: Initial step pivot from bed to recliner with HHA for stability, seated rest secondary to c/o dizziness; additional sit<>stand with rollator, good ability to lock brake without cues, min guard for balance    Ambulation/Gait Ambulation/Gait assistance: Min guard Gait Distance (Feet): 112 Feet Assistive device: Rollator (4 wheels) Gait Pattern/deviations: Step-through pattern;Decreased stride length;Trunk flexed Gait velocity: Decreased     General Gait Details: Slow, labored gait with rollator and min guard for balance, pt clearly fatigued; performed on 4L O2 [place]   Stairs             Wheelchair Mobility    Modified Rankin (Stroke Patients Only)       Balance Overall balance assessment: Needs assistance Sitting-balance support: No upper extremity supported;Feet supported Sitting balance-Leahy Scale: Fair     Standing balance support: Bilateral upper extremity supported;During functional activity;Single extremity supported Standing balance-Leahy Scale: Poor Standing balance comment: reliant on at least single UE support; required assist for posterior  pericare/hygiene while  standing                            Cognition Arousal/Alertness: Awake/alert Behavior During Therapy: WFL for tasks assessed/performed Overall Cognitive Status: Within Functional Limits for tasks assessed                                          Exercises      General Comments General comments (skin integrity, edema, etc.): SpO2 91% on 4L O2 [place]; HR 70s, BP 109/66      Pertinent Vitals/Pain Pain Assessment: No/denies pain Pain Intervention(s): Monitored during session    Home Living                          Prior Function            PT Goals (current goals can now be found in the care plan section) Progress towards PT goals: Progressing toward goals    Frequency    Min 3X/week      PT Plan Current plan remains appropriate    Co-evaluation              AM-PAC PT "6 Clicks" Mobility   Outcome Measure  Help needed turning from your back to your side while in a flat bed without using bedrails?: None Help needed moving from lying on your back to sitting on the side of a flat bed without using bedrails?: A Little Help needed moving to and from a bed to a chair (including a wheelchair)?: A Little Help needed standing up from a chair using your arms (e.g., wheelchair or bedside chair)?: A Little Help needed to walk in hospital room?: A Little Help needed climbing 3-5 steps with a railing? : A Lot 6 Click Score: 18    End of Session Equipment Utilized During Treatment: Oxygen;Gait belt Activity Tolerance: Patient tolerated treatment well;Patient limited by fatigue Patient left: in bed;with call bell/phone within reach (for PICC line placement) Nurse Communication: Mobility status PT Visit Diagnosis: Other abnormalities of gait and mobility (R26.89);Muscle weakness (generalized) (M62.81)     Time: 5397-6734 PT Time Calculation (min) (ACUTE ONLY): 27 min  Charges:  $Therapeutic Exercise: 8-22  mins $Therapeutic Activity: 8-22 mins                     Mabeline Caras, PT, DPT Acute Rehabilitation Services  Pager [phone] Office [place] 07/03/2021, 11:54 AM

## 2021-07-03 NOTE — Assessment & Plan Note (Signed)
BMI 39.7.  Healthy lifestyle recommended.

## 2021-07-03 NOTE — Hospital Course (Addendum)
76 year old female with hypertension, hyperlipidemia, diabetes, SDH 2014, SA node dysfunction status post pacemaker, OSA, PAH, interstitial lung disease on chronic 4 L O2, diastolic CHF, A. fib on Eliquis, CKD stage III AAA, cardiorenal syndrome, DVT, initially presented to [facility] ED on 06/25/2021 with shortness of breath, cough, fevers, chills dysuria and right upper quadrant pain.  Patient was admitted with severe sepsis, septic shock secondary to multifocal pneumonia, septic emboli and strep bacteremia, Klebsiella UTI.  Initially required Levophed, stress dose steroids for short time.  TEE 1/6 consistent with infected pacemaker lead with vegetation.  Patient also required 1 unit of packed RBCs for hemoglobin of 7, no plans for endoscopy. Acute on chronic CKD has improved.  LFTs has improved (shock liver) Patient was transferred to [facility] on 1/7 for pacer extraction, EP cardiology and cardiology were consulted, not considered a candidate for transvenous lead extraction due to significant comorbidities.  Patient also developed A. fib with RVR and was started on amiodarone. ID is following, will need prolonged course of antibiotic treatment. Underwent TEE/DCCV on 07/04/2021.  Now awaiting skilled nursing facility for rehab

## 2021-07-03 NOTE — Progress Notes (Signed)
OT Cancellation Note  Patient Details Name: Debra Saunders MRN: [id number] DOB: Jan 10, 1946   Cancelled Treatment:    Reason Eval/Treat Not Completed: Fatigue/lethargy limiting ability to participate. Pt politely declined any EOB or OOB activity today stating, " I am wiped out". OT will follow up next available time  Debra Saunders 07/03/2021, 2:28 PM

## 2021-07-03 NOTE — Progress Notes (Signed)
Peripherally Inserted Central Catheter Placement  The IV Nurse has discussed with the patient and/or persons authorized to consent for the patient, the purpose of this procedure and the potential benefits and risks involved with this procedure.  The benefits include less needle sticks, lab draws from the catheter, and the patient may be discharged home with the catheter. Risks include, but not limited to, infection, bleeding, blood clot (thrombus formation), and puncture of an artery; nerve damage and irregular heartbeat and possibility to perform a PICC exchange if needed/ordered by physician.  Alternatives to this procedure were also discussed.  Bard Power PICC patient education guide, fact sheet on infection prevention and patient information card has been provided to patient /or left at bedside.    PICC Placement Documentation  PICC Single Lumen 07/03/21 Right Brachial 39 cm 0 cm (Active)  Indication for Insertion or Continuance of Line Home intravenous therapies (PICC only) 07/03/21 1110  Exposed Catheter (cm) 0 cm 07/03/21 1110  Site Assessment Clean;Dry;Intact 07/03/21 1110  Line Status Flushed;Blood return noted;Saline locked 07/03/21 1110  Dressing Type Transparent 07/03/21 1110  Dressing Status Clean;Dry;Intact 07/03/21 1110  Antimicrobial disc in place? Yes 07/03/21 1110  Dressing Change Due 07/10/21 07/03/21 1110       Kippy Melena Ramos 07/03/2021, 11:13 AM

## 2021-07-03 NOTE — Progress Notes (Signed)
[facility]  Date of Admission:  06/29/2021     Total days of antibiotics 10         ASSESSMENT:  Ms. Usery completed her Penicillin allergy skin test which was tolerated well and negative. Next step is amoxicillin challenge. [name] Has TEE scheduled tomorrow. PICC line in right upper extremity. Discussed plan of care to include 6 weeks of IV antibiotics with ceftriaxone and then transition to oral antibiotics for chronic suppression with retained pacemaker and lead infection.  Remaining medical and supportive care per Primary Team and Cardiology.  PLAN:  Continue current dose of ceftriaxone TEE scheduled tomorrow  Await amoxicillin challenge results for final recommendations.  Remaining medical care per primary team and Cardiology.    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  Active Problems:   Hypertension   Diabetes ([address] [place])   Pulmonary hypertension, unspecified (HCC)   Hyperlipidemia   OSA (obstructive sleep apnea)   Acute on chronic respiratory failure with hypoxia (HCC)   Atrial fibrillation with tachy brady syndrome    Obesity (BMI 30-39.9)   Chronic diastolic CHF (congestive heart failure) (HCC)   Acute renal failure superimposed on stage 3a chronic kidney disease (HCC)   DVT (deep venous thrombosis) (HCC)   Acute blood loss anemia   klebsiella pneumoniae UTI   Pressure injury of skin    amoxicillin  250 mg Oral Once   apixaban  5 mg Oral BID   Chlorhexidine Gluconate Cloth  6 each Topical Daily   Difluprednate  1 drop Both Eyes BID   digoxin  0.125 mg Oral Daily   DULoxetine  60 mg Oral Daily   gabapentin  600 mg Oral TID   insulin aspart  0-9 Units Subcutaneous TID WC   macitentan  10 mg Oral Daily   pantoprazole (PROTONIX) IV  40 mg Intravenous Q24H   polyethylene glycol  17 g Oral BID   rosuvastatin  10 mg Oral Daily   Selexipag  1,600 mcg Oral BID   sildenafil   20 mg Oral TID   sodium chloride flush  10-40 mL Intracatheter Q12H   sodium chloride flush  3 mL Intravenous Q12H   spironolactone  25 mg Oral Daily   torsemide  20 mg Oral QPM   torsemide  40 mg Oral Daily    SUBJECTIVE:  Afebrile overnight with no acute events. Feeling tired today.   Allergies  Allergen Reactions   Aspirin Anaphylaxis and Hives    Pt states that she is unable to take because she had bleeding in her brain   Other Other (See Comments)    Pt states that she is unable to take blood thinners because she had bleeding in her brain; Blood Thinners-per doctor at [facility] (Xarelto)   Penicillins Hives, Shortness Of Breath, Swelling and Other (See Comments)    Has patient had a PCN reaction causing immediate rash, facial/tongue/throat swelling, SOB or lightheadedness with hypotension: Yes Has patient had a PCN reaction causing severe rash involving mucus membranes or skin necrosis: No Has patient had a PCN reaction that required hospitalization No Has patient had a PCN reaction occurring within the last 10 years: No If all of the above answers are "NO", then may proceed with Cephalosporin use.     Review of Systems: Review of Systems  Constitutional:  Negative for chills, fever and weight loss.  Respiratory:  Negative for cough, shortness of breath  and wheezing.   Cardiovascular:  Negative for chest pain and leg swelling.  Gastrointestinal:  Negative for abdominal pain, constipation, diarrhea, nausea and vomiting.  Skin:  Negative for rash.     OBJECTIVE: Vitals:   07/03/21 0716 07/03/21 0830 07/03/21 0955 07/03/21 1150  BP: (!) 92/48 (!) 99/51 111/75 (!) 121/59  Pulse: 72 70 71 71  Resp: 18 (!) 28 20 (!) 23  Temp: 97.8 F (36.6 C)   97.8 F (36.6 C)  TempSrc: Oral   Oral  SpO2: 98% 94% 94% 93%  Weight:      Height:       Body mass index is 39.74 kg/m.  Physical Exam Constitutional:      General: She is not in acute distress.    Appearance: She is  well-developed.     Interventions: Nasal cannula in place.     Comments: Lying in bed with head of bed elevated; pleasant.   Cardiovascular:     Rate and Rhythm: Normal rate and regular rhythm.     Heart sounds: Normal heart sounds.  Pulmonary:     Effort: Pulmonary effort is normal.     Breath sounds: Normal breath sounds.  Skin:    General: Skin is warm and dry.  Neurological:     Mental Status: She is alert and oriented to person, place, and time.  Psychiatric:        Behavior: Behavior normal.        Thought Content: Thought content normal.        Judgment: Judgment normal.    Lab Results Lab Results  Component Value Date   WBC 19.6 (H) 07/03/2021   HGB 8.0 (L) 07/03/2021   HCT 26.3 (L) 07/03/2021   MCV 86.8 07/03/2021   PLT 196 07/03/2021    Lab Results  Component Value Date   CREATININE 1.27 (H) 07/03/2021   BUN 14 07/03/2021   NA 131 (L) 07/03/2021   K 4.2 07/03/2021   CL 92 (L) 07/03/2021   CO2 32 07/03/2021    Lab Results  Component Value Date   ALT 9 06/30/2021   AST 11 (L) 06/30/2021   ALKPHOS 99 06/30/2021   BILITOT 0.9 06/30/2021     Microbiology: Recent Results (from the past 240 hour(s))  Blood culture (routine x 2)     Status: Abnormal   Collection Time: 06/25/21  2:33 AM   Specimen: BLOOD RIGHT FOREARM  Result Value Ref Range Status   Specimen Description   Final    BLOOD RIGHT FOREARM Performed at [facility], [address], [place], [place] [address]    Special Requests   Final    BOTTLES DRAWN AEROBIC AND ANAEROBIC Blood Culture results may not be optimal due to an inadequate volume of blood received in culture bottles Performed at [facility], [address] [place], [place] [address]    Culture  Setup Time   Final    GRAM POSITIVE COCCI IN BOTH AEROBIC AND ANAEROBIC BOTTLES Organism ID to follow CRITICAL RESULT CALLED TO, READ BACK BY AND VERIFIED WITHArdeen Garland PHARMD [id number] 06/25/21 HNM Performed at  [facility], [address] [place], [place] [address]    Culture STREPTOCOCCUS GROUP G (A)  Final   Report Status 06/28/2021 FINAL  Final   Organism ID, Bacteria STREPTOCOCCUS GROUP G  Final      Susceptibility   Streptococcus group g - MIC*    CLINDAMYCIN RESISTANT Resistant  AMPICILLIN <=0.25 SENSITIVE Sensitive     ERYTHROMYCIN 2 RESISTANT Resistant     VANCOMYCIN 0.5 SENSITIVE Sensitive     CEFTRIAXONE <=0.12 SENSITIVE Sensitive     LEVOFLOXACIN 0.5 SENSITIVE Sensitive     PENICILLIN Value in next row Sensitive      SENSITIVE0.06    * STREPTOCOCCUS GROUP G  Blood Culture ID Panel (Reflexed)     Status: Abnormal   Collection Time: 06/25/21  2:33 AM  Result Value Ref Range Status   Enterococcus faecalis NOT DETECTED NOT DETECTED Final   Enterococcus Faecium NOT DETECTED NOT DETECTED Final   Listeria monocytogenes NOT DETECTED NOT DETECTED Final   Staphylococcus species NOT DETECTED NOT DETECTED Final   Staphylococcus aureus (BCID) NOT DETECTED NOT DETECTED Final   Staphylococcus epidermidis NOT DETECTED NOT DETECTED Final   Staphylococcus lugdunensis NOT DETECTED NOT DETECTED Final   Streptococcus species DETECTED (A) NOT DETECTED Final    Comment: Not Enterococcus species, Streptococcus agalactiae, Streptococcus pyogenes, or Streptococcus pneumoniae. CRITICAL RESULT CALLED TO, READ BACK BY AND VERIFIED WITH: Livengood VOJJKK [id number] 06/25/21 HNM    Streptococcus agalactiae NOT DETECTED NOT DETECTED Final   Streptococcus pneumoniae NOT DETECTED NOT DETECTED Final   Streptococcus pyogenes NOT DETECTED NOT DETECTED Final   A.calcoaceticus-baumannii NOT DETECTED NOT DETECTED Final   Bacteroides fragilis NOT DETECTED NOT DETECTED Final   Enterobacterales NOT DETECTED NOT DETECTED Final   Enterobacter cloacae complex NOT DETECTED NOT DETECTED Final   Escherichia coli NOT DETECTED NOT DETECTED Final   Klebsiella aerogenes NOT DETECTED NOT DETECTED Final   Klebsiella  oxytoca NOT DETECTED NOT DETECTED Final   Klebsiella pneumoniae NOT DETECTED NOT DETECTED Final   Proteus species NOT DETECTED NOT DETECTED Final   Salmonella species NOT DETECTED NOT DETECTED Final   Serratia marcescens NOT DETECTED NOT DETECTED Final   Haemophilus influenzae NOT DETECTED NOT DETECTED Final   Neisseria meningitidis NOT DETECTED NOT DETECTED Final   Pseudomonas aeruginosa NOT DETECTED NOT DETECTED Final   Stenotrophomonas maltophilia NOT DETECTED NOT DETECTED Final   Candida albicans NOT DETECTED NOT DETECTED Final   Candida auris NOT DETECTED NOT DETECTED Final   Candida glabrata NOT DETECTED NOT DETECTED Final   Candida krusei NOT DETECTED NOT DETECTED Final   Candida parapsilosis NOT DETECTED NOT DETECTED Final   Candida tropicalis NOT DETECTED NOT DETECTED Final   Cryptococcus neoformans/gattii NOT DETECTED NOT DETECTED Final    Comment: Performed at [facility], [address] [place], [place] [address]  Resp Panel by RT-PCR (Flu A&B, Covid) Nasopharyngeal Swab     Status: None   Collection Time: 06/25/21  3:33 AM   Specimen: Nasopharyngeal Swab; Nasopharyngeal(NP) swabs in vial transport medium  Result Value Ref Range Status   SARS Coronavirus 2 by RT PCR NEGATIVE NEGATIVE Final    Comment: (NOTE) SARS-CoV-2 target nucleic acids are NOT DETECTED.  The SARS-CoV-2 RNA is generally detectable in upper respiratory specimens during the acute phase of infection. The lowest concentration of SARS-CoV-2 viral copies this assay can detect is 138 copies/mL. A negative result does not preclude SARS-Cov-2 infection and should not be used as the sole basis for treatment or other patient management decisions. A negative result may occur with  improper specimen collection/handling, submission of specimen other than nasopharyngeal swab, presence of viral mutation(s) within the areas targeted by this assay, and inadequate number of viral copies(<138 copies/mL). A  negative result must be combined with clinical observations, patient history, and epidemiological information. The  expected result is Negative.  Fact Sheet for Patients:  EntrepreneurPulse.com.au  Fact Sheet for Healthcare Providers:  IncredibleEmployment.be  This test is no t yet approved or cleared by the [place] FDA and  has been authorized for detection and/or diagnosis of SARS-CoV-2 by FDA under an Emergency Use Authorization (EUA). This EUA will remain  in effect (meaning this test can be used) for the duration of the COVID-19 declaration under Section 564(b)(1) of the Act, 21 U.S.C.section 360bbb-3(b)(1), unless the authorization is terminated  or revoked sooner.       Influenza A by PCR NEGATIVE NEGATIVE Final   Influenza B by PCR NEGATIVE NEGATIVE Final    Comment: (NOTE) The Xpert Xpress SARS-CoV-2/FLU/RSV plus assay is intended as an aid in the diagnosis of influenza from Nasopharyngeal swab specimens and should not be used as a sole basis for treatment. Nasal washings and aspirates are unacceptable for Xpert Xpress SARS-CoV-2/FLU/RSV testing.  Fact Sheet for Patients: EntrepreneurPulse.com.au  Fact Sheet for Healthcare Providers: IncredibleEmployment.be  This test is not yet approved or cleared by the [place] FDA and has been authorized for detection and/or diagnosis of SARS-CoV-2 by FDA under an Emergency Use Authorization (EUA). This EUA will remain in effect (meaning this test can be used) for the duration of the COVID-19 declaration under Section 564(b)(1) of the Act, 21 U.S.C. section 360bbb-3(b)(1), unless the authorization is terminated or revoked.  Performed at [facility], [address] [place], [place] [address]   Blood culture (routine x 2)     Status: Abnormal   Collection Time: 06/25/21  5:55 AM   Specimen: BLOOD  Result Value Ref Range Status    Specimen Description   Final    BLOOD LEFT ARM Performed at [facility], [address], [place], [place] [address]    Special Requests   Final    BOTTLES DRAWN AEROBIC AND ANAEROBIC Blood Culture adequate volume Performed at [facility], [address], [place], [place] [address]    Culture  Setup Time   Final    GRAM POSITIVE COCCI IN BOTH AEROBIC AND ANAEROBIC BOTTLES CRITICAL VALUE NOTED.  VALUE IS CONSISTENT WITH PREVIOUSLY REPORTED AND CALLED VALUE. Performed at [facility], [address] [place], [place] [address]    Culture (A)  Final    STREPTOCOCCUS GROUP G SUSCEPTIBILITIES PERFORMED ON PREVIOUS CULTURE WITHIN THE LAST 5 DAYS. Performed at [facility], [place] [address] [place], [place] [address]    Report Status 06/28/2021 FINAL  Final  Urine Culture     Status: Abnormal   Collection Time: 06/25/21  6:55 AM   Specimen: Urine, Clean Catch  Result Value Ref Range Status   Specimen Description   Final    URINE, CLEAN CATCH Performed at [facility], [address], [place], [place] [address]    Special Requests   Final    NONE Performed at [facility], [address] [place], [place] [address]    Culture >=100,000 COLONIES/mL KLEBSIELLA PNEUMONIAE (A)  Final   Report Status 06/27/2021 FINAL  Final   Organism ID, Bacteria KLEBSIELLA PNEUMONIAE (A)  Final      Susceptibility   Klebsiella pneumoniae - MIC*    AMPICILLIN RESISTANT Resistant     CEFAZOLIN <=4 SENSITIVE Sensitive     CEFEPIME <=0.12 SENSITIVE Sensitive     CEFTRIAXONE <=0.25 SENSITIVE Sensitive     CIPROFLOXACIN <=0.25 SENSITIVE Sensitive     GENTAMICIN <=1 SENSITIVE Sensitive  IMIPENEM <=0.25 SENSITIVE Sensitive     NITROFURANTOIN 32 SENSITIVE Sensitive     TRIMETH/SULFA <=20 SENSITIVE Sensitive     AMPICILLIN/SULBACTAM <=2 SENSITIVE Sensitive     PIP/TAZO <=4 SENSITIVE Sensitive     * >=100,000 COLONIES/mL KLEBSIELLA PNEUMONIAE  C  Difficile Quick Screen w PCR reflex     Status: None   Collection Time: 06/25/21  6:06 PM   Specimen: Stool  Result Value Ref Range Status   C Diff antigen NEGATIVE NEGATIVE Final   C Diff toxin NEGATIVE NEGATIVE Final   C Diff interpretation No C. difficile detected.  Final    Comment: Performed at [facility], [address] [place], [place] [address]  MRSA Next Gen by PCR, Nasal     Status: None   Collection Time: 06/25/21 10:55 PM   Specimen: Nasal Mucosa; Nasal Swab  Result Value Ref Range Status   MRSA by PCR Next Gen NOT DETECTED NOT DETECTED Final    Comment: (NOTE) [name] (FDA approved for NASAL specimens only), is one component of a comprehensive MRSA colonization surveillance program. It is not intended to diagnose MRSA infection nor to guide or monitor treatment for MRSA infections. Test performance is not FDA approved in patients less than 62 years old. Performed at [facility], [address] [place], [place] [address]   Culture, blood (Routine X 2) w Reflex to ID Panel     Status: None   Collection Time: 06/27/21  8:29 AM   Specimen: BLOOD RIGHT HAND  Result Value Ref Range Status   Specimen Description BLOOD RIGHT HAND  Final   Special Requests   Final    BOTTLES DRAWN AEROBIC AND ANAEROBIC Blood Culture adequate volume   Culture   Final    NO GROWTH 5 DAYS Performed at [facility], [address] [place], [place] [address]    Report Status 07/02/2021 FINAL  Final  Culture, blood (Routine X 2) w Reflex to ID Panel     Status: None   Collection Time: 06/27/21  8:29 AM   Specimen: BLOOD RIGHT HAND  Result Value Ref Range Status   Specimen Description BLOOD RIGHT HAND  Final   Special Requests   Final    BOTTLES DRAWN AEROBIC ONLY Blood Culture adequate volume   Culture   Final    NO GROWTH 5 DAYS Performed at [facility], [address] [place], [place] [address]    Report Status 07/02/2021  FINAL  Final     Terri Piedra, NP [facility]  07/03/2021  3:07 PM

## 2021-07-03 NOTE — Progress Notes (Addendum)
Advanced Heart Failure Rounding Note   Subjective:    Remains in AF on amio gtt at 30/hr + diltiazem 30 mg q 6 hrs. Anticoagulated with heparin gtt.  Bld cultures 1/5---> No growth.   1/9- EP consulted. Not a candidate for extraction d/t comorbid conditions.   On po Torsemide. No weight this am. Weight yesterday up 10 lb (bed weight).  Denies dyspnea. Occasional cough. No CP.  Objective:   Weight Range: Not reliable, mix of bed and standing weight  Vital Signs:   Temp:  [97.6 F (36.4 C)-99.1 F (37.3 C)] 97.8 F (36.6 C) (01/11 0332) Pulse Rate:  [70-78] 71 (01/11 0332) Resp:  [16-20] 18 (01/11 0332) BP: (97-125)/(50-63) 107/50 (01/11 0332) SpO2:  [94 %-96 %] 96 % (01/11 0332) Last BM Date: 06/29/21  Weight change: Filed Weights   06/30/21 0621 07/01/21 0613 07/02/21 0500  Weight: 89.8 kg 88.4 kg 92.3 kg    Intake/Output:   Intake/Output Summary (Last 24 hours) at 07/03/2021 4235 Last data filed at 07/03/2021 0600 Gross per 24 hour  Intake 556.34 ml  Output 1400 ml  Net -843.66 ml     Physical Exam: General:  Elderly female lying in bed. No distress HEENT: normal Neck: no JVD. Carotids 2+ bilat; no bruits. No lymphadenopathy or thryomegaly appreciated. Cor: PMI nondisplaced. Irregular rhythm. No rubs, gallops or murmurs. Lungs: clear, CPAP on Abdomen: soft, nontender, nondistended. No hepatosplenomegaly. No bruits or masses. Good bowel sounds. Extremities: no cyanosis, clubbing, rash, LLE lymphedema Neuro: alert & orientedx3, cranial nerves grossly intact. moves all 4 extremities w/o difficulty. Affect pleasant     Telemetry: Afib 70s, intermittently Vpaced   Labs: Basic Metabolic Panel: Recent Labs  Lab 06/29/21 1254 06/30/21 0522 07/01/21 0011 07/02/21 0142 07/03/21 0051  NA 140 138 137 133* 131*  K 3.9 3.9 3.4* 4.0 4.2  CL 96* 94* 95* 92* 92*  CO2 34* 34* 35* 35* 32  GLUCOSE 226* 175* 176* 187* 191*  BUN 31* 19 17 13 14   CREATININE  0.96 0.80 0.86 1.02* 1.27*  CALCIUM 8.3* 8.1* 8.0* 7.8* 7.9*  MG  --   --  1.8 1.6* 2.1    Liver Function Tests: Recent Labs  Lab 06/26/21 0847 06/28/21 0500 06/29/21 1254 06/29/21 1608 06/30/21 0522  AST 16 13* 14* 13* 11*  ALT 11 12 11 12 9   ALKPHOS 137* 123 126 122 99  BILITOT 1.9* 1.0 1.1 0.9 0.9  PROT 6.6 6.3* 6.4* 6.8 5.8*  ALBUMIN 2.3* 2.4* 2.3* 2.3* 2.0*   No results for input(s): LIPASE, AMYLASE in the last 168 hours.  No results for input(s): AMMONIA in the last 168 hours.  CBC: Recent Labs  Lab 06/28/21 0500 06/29/21 0507 06/29/21 1254 06/30/21 0522 07/01/21 0011 07/02/21 0142 07/03/21 0051  WBC 9.3 15.8* 18.3* 20.6* 19.7* 18.3* 19.6*  NEUTROABS 7.3 11.9*  --   --   --   --   --   HGB 7.7* 8.0* 8.9* 8.0* 8.6* 8.0* 8.0*  HCT 23.4* 24.4* 27.1* 24.5* 27.8* 24.9* 26.3*  MCV 83.9 83.6 84.2 85.1 86.3 84.7 86.8  PLT 118* 135* 155 166 208 203 196    Cardiac Enzymes: No results for input(s): CKTOTAL, CKMB, CKMBINDEX, TROPONINI in the last 168 hours.  BNP: BNP (last 3 results) Recent Labs    08/24/20 1506 03/27/21 1530 06/25/21 1350  BNP 51.2 31.1 344.7*    ProBNP (last 3 results) No results for input(s): PROBNP in the last 8760 hours.  Other results:  Imaging: [place] EKG SITE RITE  Result Date: 07/02/2021 If Site Rite image not attached, placement could not be confirmed due to current cardiac rhythm.    Medications:     Scheduled Medications:  apixaban  5 mg Oral BID   Difluprednate  1 drop Both Eyes BID   digoxin  0.125 mg Oral Daily   diltiazem  30 mg Oral Q6H   DULoxetine  60 mg Oral Daily   gabapentin  600 mg Oral TID   insulin aspart  0-9 Units Subcutaneous TID WC   macitentan  10 mg Oral Daily   pantoprazole (PROTONIX) IV  40 mg Intravenous Q24H   polyethylene glycol  17 g Oral BID   rosuvastatin  10 mg Oral Daily   Selexipag  1,600 mcg Oral BID   sildenafil  20 mg Oral TID   sodium chloride flush  3 mL Intravenous Q12H    spironolactone  25 mg Oral Daily   torsemide  40 mg Oral BID    Infusions:  sodium chloride     amiodarone 30 mg/hr (07/02/21 2139)   cefTRIAXone (ROCEPHIN)  IV Stopped (07/02/21 1304)    PRN Medications: sodium chloride, acetaminophen **OR** acetaminophen, albuterol, cholestyramine light, hydrALAZINE, HYDROcodone-acetaminophen, morphine injection, sodium chloride flush   Assessment/Plan:   1. 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 - shock resolved - ID following  - Bld Cx 1/5 - No growth  - ID has seen. Continue ceftriaxone. Will need IV abx X 6 weeks followed by suppressive antibiotics. Will eventually need PICC. - EP did not recommend extraction due to co-morbid conditions.    2. Acute on chronichypoxic respiratory failure with CAP - has underlying ILD. On home O2 - ? Septic embolization to lung -  ID following  - Continue ceftriaxone - On 4 liters [place] which is her home regimen. Sats stable.    3. Pulmonary hypertension: She was followed in the past at [facility].  Based on 4/19 cath, she had severe PAH with PVR 7.2 WU. Chronic hypoxemic respiratory failure, on home oxygen.  She has a history of remote DVT (2004) but V/Q scan negative for evidence of chronic PE.  High resolution CT chest in 10/19 showed pulmonary fibrosis, and PFTs in 2017 showed severe restriction.  Serological workup for rheumatological diseases was unremarkable.  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 Amiodarone was stopped given lung parenchymal disease. 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  - PH meds resumed sildenafil 20 tid, macitentan 10 daily - Decision made to resume selexipeg at 800 bid (half PTA dose)  given that she was off > 3 days. Now back up to 1,600 mcg BID - Continue digoxin for RV support - Continue CPAP and home oxygen.    4. PAF with tachy-brady syndrome - back in AF now - amio stopped in 2019 due to underlying lung disease.  - Failed rate control with oral cardizem. Amio gtt added with improved rate control.  - On apixaban PTA. Stopped due to concern over GIB on admit. Hgb 8 today.  - Back on Eliquis 5 mg BID - TEE/DCCV on 01/12   5. AKI due to ATN/shock - resolved - baseline Cr 1.03 - peak Scr  3.21 improved  to 0.80, now creeping back up slowly > 1.0 > 1.27. Possibly d/t diuresis   6. 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 - Volume status looks okay. Scr trending up last few days. ? 10 lb weight gain overnight. Reweigh. - Decrease Torsemide to 40 mg am/20 mg qhs - Continue spiro 25 mg daily - consider SGLT2i prior to d/c - Scr trending up, 0.8 > 1 > 1.27   7. Severe HTN - Stable on home medications. - cover with PRN hydrlazine   8. 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 DM2 - insulin. - cover with SSI    10. OSA on CPAP - continue CPAP  11. Hypokalemia/hypomagnesemia: - K and Mag stable   12. Anemia  -Hgb 8 today.  -No obvious source.  -check FOBT   Will need HH PT at discharge. Declines SNF [facility] contacted by TOC for CPAP - home CPAP does not work Coordinate IV abx with [facility] Coordinator  Length of Stay: Sully, LINDSAY N  NP-C  07/03/2021, 7:02 AM  Advanced Heart Failure Team Pager [phone] (M-F; [place])  Please contact [facility] for night-coverage after hours (4p -7a ) and weekends on [email or web address]   Patient seen with NP, agree with the above note.   Feels weak when she walks.   She remains in atrial fibrillation, rate is controlled. SBP 90s-100s.  I/Os mildly negative.   She remains on ceftriaxone.   General: NAD Neck: JVP  8-9 cm, no thyromegaly or thyroid nodule.  Lungs: Clear to auscultation bilaterally with normal respiratory effort. CV: Nondisplaced PMI.  Heart irregular S1/S2, no S3/S4, no murmur.  Trace ankle edema.  Abdomen: Soft, nontender, no hepatosplenomegaly, no distention.  Skin: Intact without lesions or rashes.  Neurologic: Alert and oriented x 3.  Psych: Normal affect. Extremities: No clubbing or cyanosis.  HEENT: Normal.   Creatinine mildly higher at 1.27.  Ok to decrease torsemide to 40 qam/20 qpm for now. Continue spironolactone.   Continue her PH regimen.   She remains in atrial fibrillation with rate controlled on amiodarone gtt.  She is on apixaban.  - Plan for TEE-guided DCCV tomorrow.  - With good rate control and soft BP, stop diltiazem.  - She remains on digoxin for RV support.   Continue ceftriaxone for endocarditis/pacer wire vegetation, group G strep.  Will need PICC eventually.   [name] 07/03/2021 9:08 AM

## 2021-07-03 NOTE — Progress Notes (Addendum)
Progress Note   Patient: Debra Saunders UVO:[id number] DOB: 03/05/1946 DOA: 06/29/2021     4 DOS: the patient was seen and examined on 07/03/2021   Brief hospital course: 76 year old female with hypertension, hyperlipidemia, diabetes, SDH 2014, SA node dysfunction status post pacemaker, OSA, PAH, interstitial lung disease on chronic 4 L O2, diastolic CHF, A. fib on Eliquis, CKD stage III AAA, cardiorenal syndrome, DVT, initially presented to [facility] ED on 06/25/2021 with shortness of breath, cough, fevers, chills dysuria and right upper quadrant pain.  Patient was admitted with severe sepsis, septic shock secondary to multifocal pneumonia, septic emboli and strep bacteremia, Klebsiella UTI.  Initially required Levophed, stress dose steroids for short time.  TEE 1/6 consistent with infected pacemaker lead with vegetation.  Patient also required 1 unit of packed RBCs for hemoglobin of 7, no plans for endoscopy. Acute on chronic CKD has improved.  LFTs has improved (shock liver) Patient was transferred to [facility] on 1/7 for pacer extraction, EP cardiology and cardiology were consulted, not considered a candidate for transvenous lead extraction due to significant comorbidities.  Patient also developed A. fib with RVR and was started on amiodarone. ID is following, will need prolonged course of antibiotic treatment.  Assessment and Plan * Severe sepsis with septic shock secondary to infected pacemaker with streptoccos bacteremia - (present on admission) - Blood cultures 1/03 + Streptococcus group G -TEE 1/6 large mobile vegetation on the RV pacing wire noted in the right atrium with estimated 1x 1.5 cm in size or larger -Blood cultures 1/5 no growth -Septic shock physiology resolved, received IV Levophed and fluids. -Cardiology, ID following. -Per EP cardiology, not a candidate for transvenous lead extraction because of significant comorbid conditions. -Continue IV ceftriaxone.  ID following, will need  prolonged course of antibiotic treatment followed by chronic suppression  Atrial fibrillation with tachy brady syndrome - (present on admission) -Patient was started on IV amiodarone on 1/8 due to atrial fibrillation with RVR.  Failed rate control with oral Cardizem. -Was on amiodarone but was stopped in 2019 due to lung disease.  Continue digoxin -Continue Eliquis 5 mg twice daily. -Per cardiology, TEE/DCCV planned on 1/12.  Acute blood loss anemia- (present on admission) Baseline hgb: 9-10.  History of anemia of chronic disease  -Presented with hgb 8>7 then transfused 1 unit PRBC -GI was consulted, deferred endoscopy  -H&H currently stable   DVT (deep venous thrombosis) (HCC)- (present on admission) -History of PE and DVT  -Initially Eliquis was placed on hold due to ABLA, now resumed -IVC filter was placed in 2015 at the time of pacemaker placement -Continue anticoagulation  Acute renal failure superimposed on stage 3a chronic kidney disease ([place])- (present on admission) Resolved. Creatinine peaked at 3.21, now improved to baseline, 1.2 today -Nephrology was consulted, now signed off.  Continue to monitor while on torsemide and Aldactone  Chronic diastolic CHF (congestive heart failure) ([place])- (present on admission) -2D echo 06/27/21: EF of 60-65% normal LVF. Diastolic parameters indeterminate -TEE this admission showed EF of 55 to 60%, RV normal, mild to moderate MR -Appears closer to euvolemic status, cardiology following, continue torsemide, spironolactone -Creatinine slowly trending up, still in baseline  Acute on chronic respiratory failure with hypoxia ([place])- (present on admission) -Chronic respiratory failure secondary to ILD, pulmonary hypertension on 4L oxygen, originally required BiPAP, back to baseline -Has underlying OSA on CPAP nightly  Hyperlipidemia- (present on admission) - LFTs now normal, continue Crestor  Pulmonary hypertension, unspecified ([place])- (present on  admission) -  Cardiology following, resumed sildenafil, macitentan, selexipag -Continue digoxin, on Eliquis, CPAP and home O2  Hypertension- (present on admission) - BP currently stable, continue torsemide, Aldactone, hydralazine as needed with parameters  Pressure injury of skin- (present on admission) Pressure Injury 06/29/21 Right Stage 1 -  Intact skin with non-blanchable redness of a localized area usually over a bony prominence. abrasion on right gluteal fold (Active)  06/29/21 1500  Location:   Location Orientation: Right  Staging: Stage 1 -  Intact skin with non-blanchable redness of a localized area usually over a bony prominence.  Wound Description (Comments): abrasion on right gluteal fold  Present on Admission: Yes       Diabetes ([place]) - Hemoglobin A1c of 5.4 on 06/25/2021 -Well-controlled, CBGs likely was elevated in the setting of severe sepsis and steroids, continue sliding scale insulin -Placed on SGLT2 inhibitor prior to discharge per cardiology  klebsiella pneumoniae UTI- (present on admission) - Currently no urinary symptoms, on IV Rocephin for infective endocarditis  Obesity (BMI 30-39.9)- (present on admission) BMI 39.7.  Healthy lifestyle recommended.  OSA (obstructive sleep apnea)- (present on admission) - Continue CPAP nightly    Subjective: No acute complaints, alert and oriented.  Was resting at the time of my examination on CPAP.  Objective Vital signs were reviewed and unremarkable. BP 111/75 (BP Location: Right Wrist)    Pulse 71    Temp 97.8 F (36.6 C) (Oral)    Resp 20    Ht 5' (1.524 m)    Wt 92.3 kg    SpO2 94%    BMI 39.74 kg/m   Physical Exam General: Alert and oriented x 3, NAD Cardiovascular: Irregularly irregular Respiratory: CTAB, no wheezing, rales or rhonchi Gastrointestinal: Soft, nontender, nondistended, NBS Ext: no pedal edema bilaterally Neuro: no new deficits Skin: No rashes Psych: Normal affect and demeanor, alert and  oriented x3    Data Reviewed:  My review of labs, imaging, notes and other tests shows no new significant findings.  Except creatinine slowly trending up, 1.27   Family Communication: Discussed with patient in detail  Disposition: Status is: Inpatient  Remains inpatient appropriate because: Plan for TEE/DCCV tomorrow, IV antibiotics  DVT prophylaxis: Apixaban    Time spent: 35 minutes  Author: Estill Cotta, MD 07/03/2021 11:32 AM  For on call review [email or web address].

## 2021-07-03 NOTE — Progress Notes (Signed)
Signed Consent for Penicillin Skin Test   Lestine Box, PharmD PGY2 Infectious Diseases Pharmacy Resident   Please check [email or web address] for unit-specific pharmacy phone numbers

## 2021-07-04 ENCOUNTER — Inpatient Hospital Stay (HOSPITAL_COMMUNITY): Payer: Medicare HMO

## 2021-07-04 ENCOUNTER — Encounter (HOSPITAL_COMMUNITY)
Admission: AD | Disposition: A | Discharge status: Rehabilitation Facility | Payer: Self-pay | Source: Other Acute Inpatient Hospital | Attending: Internal Medicine

## 2021-07-04 ENCOUNTER — Inpatient Hospital Stay (HOSPITAL_COMMUNITY): Payer: Medicare HMO | Admitting: Certified Registered Nurse Anesthetist

## 2021-07-04 ENCOUNTER — Encounter (HOSPITAL_COMMUNITY): Payer: Self-pay | Admitting: Family Medicine

## 2021-07-04 DIAGNOSIS — I340 Nonrheumatic mitral (valve) insufficiency: Secondary | ICD-10-CM

## 2021-07-04 DIAGNOSIS — I4891 Unspecified atrial fibrillation: Secondary | ICD-10-CM

## 2021-07-04 HISTORY — PX: TEE WITHOUT CARDIOVERSION: SHX5443

## 2021-07-04 HISTORY — PX: CARDIOVERSION: SHX1299

## 2021-07-04 LAB — GLUCOSE, CAPILLARY
Glucose-Capillary: 140 mg/dL — ABNORMAL HIGH (ref 70–99)
Glucose-Capillary: 130 mg/dL — ABNORMAL HIGH (ref 70–99)
Glucose-Capillary: 115 mg/dL — ABNORMAL HIGH (ref 70–99)
Glucose-Capillary: 135 mg/dL — ABNORMAL HIGH (ref 70–99)

## 2021-07-04 LAB — CBC
HCT: 26.1 % — ABNORMAL LOW (ref 36.0–46.0)
Hemoglobin: 8.2 g/dL — ABNORMAL LOW (ref 12.0–15.0)
MCH: 27.6 pg (ref 26.0–34.0)
MCHC: 31.4 g/dL (ref 30.0–36.0)
MCV: 87.9 fL (ref 80.0–100.0)
Platelets: 176 10*3/uL (ref 150–400)
RBC: 2.97 MIL/uL — ABNORMAL LOW (ref 3.87–5.11)
RDW: 14.7 % (ref 11.5–15.5)
WBC: 19 10*3/uL — ABNORMAL HIGH (ref 4.0–10.5)
nRBC: 0 % (ref 0.0–0.2)

## 2021-07-04 LAB — BASIC METABOLIC PANEL
Anion gap: 11 (ref 5–15)
BUN: 16 mg/dL (ref 8–23)
CO2: 31 mmol/L (ref 22–32)
Calcium: 7.9 mg/dL — ABNORMAL LOW (ref 8.9–10.3)
Chloride: 89 mmol/L — ABNORMAL LOW (ref 98–111)
Creatinine, Ser: 1.45 mg/dL — ABNORMAL HIGH (ref 0.44–1.00)
GFR, Estimated: 37 mL/min — ABNORMAL LOW (ref >60)
Glucose, Bld: 123 mg/dL — ABNORMAL HIGH (ref 70–99)
Potassium: 4.4 mmol/L (ref 3.5–5.1)
Sodium: 131 mmol/L — ABNORMAL LOW (ref 135–145)

## 2021-07-04 LAB — OCCULT BLOOD X 1 CARD TO LAB, STOOL: Fecal Occult Bld: NEGATIVE

## 2021-07-04 SURGERY — ECHOCARDIOGRAM, TRANSESOPHAGEAL
Anesthesia: General

## 2021-07-04 MED ORDER — PHENYLEPHRINE 40 MCG/ML (10ML) SYRINGE FOR IV PUSH (FOR BLOOD PRESSURE SUPPORT)
PREFILLED_SYRINGE | INTRAVENOUS | Status: DC | PRN
  Administered 2021-07-04: 120 ug via INTRAVENOUS

## 2021-07-04 MED ORDER — PENICILLIN G POTASSIUM 20000000 UNITS IJ SOLR
8.0000 10*6.[IU] | Freq: Two times a day (BID) | INTRAVENOUS | Status: DC
  Administered 2021-07-04 – 2021-07-09 (×11): 8 10*6.[IU] via INTRAVENOUS
  Filled 2021-07-04 (×13): qty 8

## 2021-07-04 MED ORDER — PROPOFOL 500 MG/50ML IV EMUL
INTRAVENOUS | Status: DC | PRN
  Administered 2021-07-04: 50 ug/kg/min via INTRAVENOUS

## 2021-07-04 MED ORDER — PROPOFOL 500 MG/50ML IV EMUL
INTRAVENOUS | Status: DC | PRN
  Administered 2021-07-04: 20 mg via INTRAVENOUS

## 2021-07-04 NOTE — Progress Notes (Signed)
Echocardiogram Echocardiogram Transesophageal has been performed.  [place] Deputy Bruce Mayers RDCS 07/04/2021, 1:51 PM

## 2021-07-04 NOTE — Progress Notes (Signed)
Occupational Therapy Treatment Patient Details Name: Debra Saunders MRN: [id number] DOB: 11/15/1945 Today's Date: 07/04/2021   History of present illness Pt is a 76 y.o. admitted 06/25/21 with c/o SOB, fever, chills, abdominal pain. Workup for septic shock secondary to klebsiella UTI and strep bacteremia with GIB, multifocal PNA, possible septic lung emboli. 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 Transfer to [facility] 06/29/21. Per cardiology, not a candidate for transvenous pacemaker lead extraction due to comorbidities. Pt developed afib with RVR; plan for TEE-guided DCCV 1/12. PMH includes HTN, afib, pacemaker, PAH, chronic resp failure (on 4L home O2), CHF, cardiorenal syndrome, DM2, OSA, stroke, ICH, chronic back pain, arthritis.   OT comments  Pt making slow progress with functional goals. Pt required increased time to complete sitting EOB with res break before continuing any activity. Pt reports feeling weak and tired. Sessio focused on energy conservation, mobility using RW, toilet transfers, toileting tasks and transfer to recliner at end of session. OT updating d/c recommendation to ST SNF before returning home for safest d/c option and pt in still very limited by generalized weakness and Poor endurance/activity tolerance. OT will continue to follow acutely to maximize level of function and safety   Recommendations for follow up therapy are one component of a multi-disciplinary discharge planning process, led by the attending physician.  Recommendations may be updated based on patient status, additional functional criteria and insurance authorization.    Follow Up Recommendations  Skilled nursing-short term rehab (<3 hours/day)    Assistance Recommended at Discharge Frequent or constant Supervision/Assistance  Patient can return home with the following  A lot of help with bathing/dressing/bathroom;A little help with walking and/or  transfers;Assist for transportation;Assistance with cooking/housework   Equipment Recommendations  None recommended by OT    Recommendations for Other Services      Precautions / Restrictions Precautions Precautions: Fall;Other (comment) Precaution Comments: bladder/bowel incontinence, watch HR, wears 4L O2 baseline, fatigues quickly Restrictions Weight Bearing Restrictions: No       Mobility Bed Mobility Overal bed mobility: Needs Assistance Bed Mobility: Supine to Sit     Supine to sit: Supervision;HOB elevated     General bed mobility comments: Increased time and effort to sit EOB, requiring rest break after completing    Transfers Overall transfer level: Needs assistance Equipment used: Rolling walker (2 wheels) Transfers: Bed to chair/wheelchair/BSC;Sit to/from Stand Sit to Stand: Min guard   Step pivot transfers: Min guard       General transfer comment: pt stood a RW for toileting/posterior hygiene max A     Balance Overall balance assessment: Needs assistance Sitting-balance support: No upper extremity supported;Feet supported Sitting balance-Leahy Scale: Fair     Standing balance support: Bilateral upper extremity supported;During functional activity;Single extremity supported Standing balance-Leahy Scale: Poor                             ADL either performed or assessed with clinical judgement   ADL       Grooming: Wash/dry hands;Wash/dry face;Min Dispensing optician: Ambulation;Rolling walker (2 wheels);BSC/3in1;Min guard;Cueing for safety   Toileting- Clothing Manipulation and Hygiene: Maximal assistance;Sit to/from stand Toileting - Clothing Manipulation Details (indicate cue type and reason): max A clothing mgt, total A hygine standing at RW     Functional mobility during ADLs: Rolling  walker (2 wheels);Min guard;Cueing for safety General ADL Comments: pt fatigues quickly     Extremity/Trunk Assessment Upper Extremity Assessment Upper Extremity Assessment: Generalized weakness   Lower Extremity Assessment Lower Extremity Assessment: Defer to PT evaluation        Vision Baseline Vision/History: 1 Wears glasses Ability to See in Adequate Light: 0 Adequate Patient Visual Report: No change from baseline     Perception     Praxis      Cognition Arousal/Alertness: Awake/alert Behavior During Therapy: WFL for tasks assessed/performed Overall Cognitive Status: Within Functional Limits for tasks assessed                                            Exercises     Shoulder Instructions       General Comments      Pertinent Vitals/ Pain       Pain Assessment: No/denies pain Pain Score: 0-No pain Faces Pain Scale: No hurt Pain Intervention(s): Monitored during session;Repositioned  Home Living                                          Prior Functioning/Environment              Frequency  Min 2X/week        Progress Toward Goals  OT Goals(current goals can now be found in the care plan section)  Progress towards OT goals: Progressing toward goals     Plan      Co-evaluation                 AM-PAC OT "6 Clicks" Daily Activity     Outcome Measure   Help from another person eating meals?: None Help from another person taking care of personal grooming?: A Little Help from another person toileting, which includes using toliet, bedpan, or urinal?: A Lot Help from another person bathing (including washing, rinsing, drying)?: A Lot Help from another person to put on and taking off regular upper body clothing?: A Little Help from another person to put on and taking off regular lower body clothing?: A Lot 6 Click Score: 16    End of Session Equipment Utilized During Treatment: Gait belt;Rolling walker (2 wheels);Other (comment) (BSC)  OT Visit Diagnosis: Other abnormalities of gait and  mobility (R26.89);Muscle weakness (generalized) (M62.81)   Activity Tolerance Patient limited by fatigue   Patient Left in chair;with call bell/phone within reach;with nursing/sitter in room   Nurse Communication          Time: 5852-7782 OT Time Calculation (min): 26 min  Charges: OT General Charges $OT Visit: 1 Visit OT Treatments $Self Care/Home Management : 8-22 mins $Therapeutic Activity: 8-22 mins    Britt Bottom 07/04/2021, 1:21 PM

## 2021-07-04 NOTE — Progress Notes (Signed)
Mobility Specialist: Progress Note   07/04/21 1703  Mobility  Activity  (Cancel)   Attempted to see pt earlier today. Unable d/t pt having cardioversion at 12:00. Went back later and pt eating dinner. Will f/u tomorrow.   [facility] Jora Galluzzo Mobility Specialist Mobility Specialist 4 [place]: [phone] Mobility Specialist 2 [place] and [place]: [phone]

## 2021-07-04 NOTE — Progress Notes (Addendum)
Advanced Heart Failure Rounding Note   Subjective:    Bld cultures 1/5---> No growth.   1/9- EP consulted. Not a candidate for extraction d/t comorbid conditions.   PICC line placed 01/11 for home abx  AF 70s. On amio gtt at 30/hr. Anticoagulated with Eliquis.   Yesterday 1,300 cc UOP + unmeasured void  Denies dyspnea. No CP. Fatigued. Declined OT yesterday   Objective:     Vital Signs:   Temp:  [97.8 F (36.6 C)-99.6 F (37.6 C)] 99.6 F (37.6 C) (01/11 2309) Pulse Rate:  [69-74] 69 (01/12 0400) Resp:  [14-28] 17 (01/12 0400) BP: (92-127)/(45-75) 127/51 (01/12 0400) SpO2:  [91 %-98 %] 93 % (01/12 0400) Weight:  [90.9 kg] 90.9 kg (01/12 0500) Last BM Date: 06/29/21  Weight change: Filed Weights   07/01/21 0613 07/02/21 0500 07/04/21 0500  Weight: 88.4 kg 92.3 kg 90.9 kg    Intake/Output:   Intake/Output Summary (Last 24 hours) at 07/04/2021 0655 Last data filed at 07/04/2021 0526 Gross per 24 hour  Intake 1205.6 ml  Output 1300 ml  Net -94.4 ml     Physical Exam: General:  Elderly female lying comfortably in bed HEENT: normal Neck: supple. no JVD. Carotids 2+ bilat; no bruits.  Cor: PMI nondisplaced. Irregular rhythm. No rubs, gallops or murmurs. Lungs: clear Abdomen: soft, nontender, nondistended. No hepatosplenomegaly. No bruits or masses. Good bowel sounds. Extremities: no cyanosis, clubbing, rash, chronic LLE lymphedema, TED hose on Neuro: alert & orientedx3, cranial nerves grossly intact. moves all 4 extremities w/o difficulty. Affect pleasant   Telemetry: Afib 70s, intermittent V pacing, short runs of NSVT up to 5 beats   Labs: Basic Metabolic Panel: Recent Labs  Lab 06/29/21 1254 06/30/21 0522 07/01/21 0011 07/02/21 0142 07/03/21 0051  NA 140 138 137 133* 131*  K 3.9 3.9 3.4* 4.0 4.2  CL 96* 94* 95* 92* 92*  CO2 34* 34* 35* 35* 32  GLUCOSE 226* 175* 176* 187* 191*  BUN 31* 19 17 13 14   CREATININE 0.96 0.80 0.86 1.02* 1.27*   CALCIUM 8.3* 8.1* 8.0* 7.8* 7.9*  MG  --   --  1.8 1.6* 2.1    Liver Function Tests: Recent Labs  Lab 06/28/21 0500 06/29/21 1254 06/29/21 1608 06/30/21 0522  AST 13* 14* 13* 11*  ALT 12 11 12 9   ALKPHOS 123 126 122 99  BILITOT 1.0 1.1 0.9 0.9  PROT 6.3* 6.4* 6.8 5.8*  ALBUMIN 2.4* 2.3* 2.3* 2.0*   No results for input(s): LIPASE, AMYLASE in the last 168 hours.  No results for input(s): AMMONIA in the last 168 hours.  CBC: Recent Labs  Lab 06/28/21 0500 06/29/21 0507 06/29/21 1254 06/30/21 0522 07/01/21 0011 07/02/21 0142 07/03/21 0051  WBC 9.3 15.8* 18.3* 20.6* 19.7* 18.3* 19.6*  NEUTROABS 7.3 11.9*  --   --   --   --   --   HGB 7.7* 8.0* 8.9* 8.0* 8.6* 8.0* 8.0*  HCT 23.4* 24.4* 27.1* 24.5* 27.8* 24.9* 26.3*  MCV 83.9 83.6 84.2 85.1 86.3 84.7 86.8  PLT 118* 135* 155 166 208 203 196    Cardiac Enzymes: No results for input(s): CKTOTAL, CKMB, CKMBINDEX, TROPONINI in the last 168 hours.  BNP: BNP (last 3 results) Recent Labs    08/24/20 1506 03/27/21 1530 06/25/21 1350  BNP 51.2 31.1 344.7*    ProBNP (last 3 results) No results for input(s): PROBNP in the last 8760 hours.    Other results:  Imaging: DG  Chest Port 1 View  Result Date: 07/03/2021 CLINICAL DATA:  RIGHT PICC line inserted EXAM: PORTABLE CHEST 1 VIEW COMPARISON:  07/12/2020 FINDINGS: Interval placement RIGHT PICC line with tip at the RIGHT cavoatrial junction. LEFT-sided pacer noted. Stable cardiac silhouette. Mild pulmonary edema pattern. IMPRESSION: 1. RIGHT PICC line insertion with tip at the cavoatrial junction. 2. Mild pulmonary edema pattern. Electronically Signed   By: Suzy Bouchard M.D.   On: 07/03/2021 11:54   [place] EKG SITE RITE  Result Date: 07/02/2021 If Site Rite image not attached, placement could not be confirmed due to current cardiac rhythm.    Medications:     Scheduled Medications:  apixaban  5 mg Oral BID   capsaicin   Topical BID   Chlorhexidine Gluconate  Cloth  6 each Topical Daily   cyclopentolate  1 drop Both Eyes QHS   Difluprednate  1 drop Both Eyes BID   digoxin  0.125 mg Oral Daily   DULoxetine  60 mg Oral Daily   gabapentin  600 mg Oral TID   insulin aspart  0-9 Units Subcutaneous TID WC   macitentan  10 mg Oral Daily   pantoprazole (PROTONIX) IV  40 mg Intravenous Q24H   polyethylene glycol  17 g Oral BID   rosuvastatin  10 mg Oral Daily   Selexipag  1,600 mcg Oral BID   sildenafil  20 mg Oral TID   sodium chloride flush  10-40 mL Intracatheter Q12H   sodium chloride flush  3 mL Intravenous Q12H   spironolactone  25 mg Oral Daily   torsemide  20 mg Oral QPM   torsemide  40 mg Oral Daily    Infusions:  sodium chloride     sodium chloride 20 mL/hr at 07/04/21 0500   amiodarone 30 mg/hr (07/04/21 0516)   cefTRIAXone (ROCEPHIN)  IV Stopped (07/03/21 1629)    PRN Medications: sodium chloride, acetaminophen **OR** acetaminophen, albuterol, cholestyramine light, diphenhydrAMINE, EPINEPHrine, hydrALAZINE, HYDROcodone-acetaminophen, morphine injection, sodium chloride flush   Assessment/Plan:   1. 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 - shock resolved - ID following  - Bld Cx 1/5 - No growth  - ID has seen. Continue ceftriaxone. Will need IV abx X 6 weeks followed by suppressive antibiotics.  - PICC placed 01/11 - EP did not recommend extraction due to co-morbid conditions.    2. Acute on chronichypoxic respiratory failure with CAP - has underlying ILD. On home O2 - ? Septic embolization to lung -  ID following  - Continue ceftriaxone - On 4 liters [place] which is her home regimen. O2 sats stable.    3. Pulmonary hypertension: She was followed in the past at [facility].  Based on 4/19 cath, she had severe PAH with PVR 7.2 WU. Chronic hypoxemic respiratory failure, on home  oxygen.  She has a history of remote DVT (2004) but V/Q scan negative for evidence of chronic PE.  High resolution CT chest in 10/19 showed pulmonary fibrosis, and PFTs in 2017 showed severe restriction.  Serological workup for rheumatological diseases was unremarkable.  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 Amiodarone was stopped given lung parenchymal disease. 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  - PH meds resumed sildenafil 20 tid, macitentan 10 daily -  Decision made to resume selexipeg at 800 bid (half PTA dose) given that she was off > 3 days. Now back up to 1,600 mcg BID - Continue digoxin for RV support - Continue CPAP and home oxygen.    4. PAF with tachy-brady syndrome - back in AF now - amio stopped in 2019 due to underlying lung disease.  - Failed rate control with oral cardizem. Cardizem discontinued. Amio gtt added with improved rate control.  - On apixaban PTA. Stopped due to concern over GIB on admit. Hgb stable in 8s.  - Back on Eliquis 5 mg BID - TEE/DCCV today   5. AKI due to ATN/shock - resolved - baseline Cr 1.03 - peak Scr  3.21 improved to 0.80, now creeping back up slowly > 1.0 > 1.27.  - BMET today    6. 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 - Volume status looks okay.  - Continue Torsemide to 40 mg am/20 mg qhs (on 40 mg BID prior to admit) - Monitor BP. Soft yesterday. Seems better this am after stopping cardizem. - Continue spiro 25 mg daily - consider SGLT2i prior to d/c - Scr 0.8 > 1 > 1.27, awaiting BMET   7. HTN - SBP intermittently soft but better this am, diastolic pressure on lower end. Monitor.   8. 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 DM2 - on insulin. - cover with SSI    10. OSA on CPAP -  continue CPAP  11. Hypokalemia/hypomagnesemia: - BMET today   12. Anemia  -Hgb has been in 8s -No labs this am. Check CBC -No obvious source.  -FOBT negative   Encouraged mobilization. Declined OT yesterday.  Will need HH PT at discharge. Declines SNF [facility] contacted by TOC for CPAP - home CPAP does not work Coordinate IV abx with [facility] Coordinator  Length of Stay: [place], LINDSAY N  NP-C  07/04/2021, 6:55 AM  Advanced Heart Failure Team Pager [phone] (M-F; [place])  Please contact [facility] for night-coverage after hours (4p -7a ) and weekends on [email or web address]   Patient seen with NP, agree with the above note.   Weight down, I/Os negative.  Creatinine mildly higher at 1.45.  Hgb stable 8.2, FOBT negative.  BP stable. Afebrile, WBCs stable at 19. Remains in atrial fibrillation.   She has been "tired," has not wanted to get out of bed.   General: NAD Neck: JVP 8 cm, no thyromegaly or thyroid nodule.  Lungs: Clear to auscultation bilaterally with normal respiratory effort. CV: Nondisplaced PMI.  Heart irregular S1/S2, no S3/S4, no murmur.  No peripheral edema.  N Abdomen: Soft, nontender, no hepatosplenomegaly, no distention.  Skin: Intact without lesions or rashes.  Neurologic: Alert and oriented x 3.  Psych: Normal affect. Extremities: No clubbing or cyanosis.  HEENT: Normal.   Creatinine mildly higher at 1.45.  Hold torsemide today (NPO for TEE/DCCV).     Continue her PH regimen.    She remains in atrial fibrillation with rate controlled on amiodarone gtt.  She is on apixaban.  - Plan for TEE-guided DCCV today.  Discussed risks/benefits with patient, she agrees to procedure.  - She remains on digoxin for RV support. Check level in am.    Continue ceftriaxone for endocarditis/pacer wire vegetation, group G strep.  Will need PICC eventually.   Hgb low but stable, no evidence for active GI bleeding (  FOBT negative).   2 person assist and not  working much with PT.  She does not want SNF but unsure that she can successfully go home.   [name] 07/04/2021 8:45 AM

## 2021-07-04 NOTE — Plan of Care (Signed)
  Problem: Education: Goal: Knowledge of General Education information will improve Description: Including pain rating scale, medication(s)/side effects and non-pharmacologic comfort measures Outcome: Progressing   Problem: Health Behavior/Discharge Planning: Goal: Ability to manage health-related needs will improve Outcome: Progressing   Problem: Clinical Measurements: Goal: Ability to maintain clinical measurements within normal limits will improve Outcome: Progressing Goal: Will remain free from infection Outcome: Progressing Goal: Diagnostic test results will improve Outcome: Progressing Goal: Respiratory complications will improve Outcome: Progressing Goal: Cardiovascular complication will be avoided Outcome: Progressing   Problem: Activity: Goal: Risk for activity intolerance will decrease Outcome: Progressing   Problem: Nutrition: Goal: Adequate nutrition will be maintained Outcome: Progressing   Problem: Coping: Goal: Level of anxiety will decrease Outcome: Progressing   Problem: Elimination: Goal: Will not experience complications related to bowel motility Outcome: Progressing Goal: Will not experience complications related to urinary retention Outcome: Progressing   Problem: Pain Managment: Goal: General experience of comfort will improve Outcome: Progressing   Problem: Safety: Goal: Ability to remain free from injury will improve Outcome: Progressing   Problem: Skin Integrity: Goal: Risk for impaired skin integrity will decrease Outcome: Progressing   Problem: Education: Goal: Knowledge of disease or condition will improve Outcome: Progressing Goal: Understanding of medication regimen will improve Outcome: Progressing Goal: Individualized Educational Video(s) Outcome: Progressing   Problem: Activity: Goal: Ability to tolerate increased activity will improve Outcome: Progressing   Problem: Cardiac: Goal: Ability to achieve and maintain  adequate cardiopulmonary perfusion will improve Outcome: Progressing   Problem: Health Behavior/Discharge Planning: Goal: Ability to safely manage health-related needs after discharge will improve Outcome: Progressing   

## 2021-07-04 NOTE — Progress Notes (Signed)
[facility]  Date of Admission:  06/29/2021     Total days of antibiotics 11         ASSESSMENT:  Ms. Puccini passed her amoxicillin challenge with no signs of anaphylaxis. Will change antibiotics to Penicillin G with plans for treatment with continuous Penicillin infusion for 6 weeks with transition to amoxicillin for suppression afterwards. PICC line in place and Home Health/OPAT orders below. Scheduled for TEE to day. Will arrange for ID office follow up. Remaining medical and supportive care per Primary Team and Cardiology.   PLAN:  Change antibiotics to Penicillin G. Home Health / OPAT orders TEE today per Cardiology Remaining medical and supportive care per Primary Team and Cardiology.  ID office follow up.  ID will sign off.   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  Allergies  Allergen Reactions   Aspirin Anaphylaxis and Hives    Pt states that she is unable to take because she had bleeding in her brain   Other Other (See Comments)    Pt states that she is unable to take blood thinners because she had bleeding in her brain; Blood Thinners-per doctor at [facility]    OPAT Orders Discharge antibiotics to be given via PICC line Discharge antibiotics: Penicillin G Per pharmacy protocol  Aim for Vancomycin trough 15-20 or AUC 400-550 (unless otherwise indicated) Duration: 6 weeks  End Date: 08/08/21  [facility] Care Per Protocol:  Home health RN for IV administration and teaching; PICC line care and labs.    Labs weekly while on IV antibiotics: _X_ CBC with differential _X_ BMP __ CMP _X_ CRP _X_ ESR __ Vancomycin trough __ CK  __ Please pull PIC at completion of IV antibiotics _X_ Please leave PIC in place until doctor has seen patient or been notified  Fax weekly labs to [phone]  Clinic Follow Up Appt:  9:30 am on 07/25/21 with Terri Piedra, NP    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  Active Problems:   Hypertension   Diabetes ([place])   Pulmonary hypertension, unspecified ([place])   Hyperlipidemia   OSA (obstructive sleep apnea)   Acute on chronic respiratory failure with hypoxia ([place])   Atrial fibrillation with tachy brady syndrome    Obesity (BMI 30-39.9)   Chronic diastolic CHF (congestive heart failure) (HCC)   Acute renal failure superimposed on stage 3a chronic kidney disease (HCC)   DVT (deep venous thrombosis) (HCC)   Acute blood loss anemia   klebsiella pneumoniae UTI   Pressure injury of skin    apixaban  5 mg Oral BID   capsaicin   Topical BID   Chlorhexidine Gluconate Cloth  6 each Topical Daily   cyclopentolate  1 drop Both Eyes QHS   Difluprednate  1 drop Both Eyes BID   digoxin  0.125 mg Oral Daily   DULoxetine  60 mg Oral Daily   gabapentin  600 mg Oral TID   insulin aspart  0-9 Units Subcutaneous TID WC   macitentan  10 mg Oral Daily   pantoprazole (PROTONIX) IV  40 mg Intravenous Q24H   polyethylene glycol  17 g Oral BID   rosuvastatin  10 mg Oral Daily   Selexipag  1,600 mcg Oral BID   sildenafil  20 mg Oral TID   sodium chloride flush  10-40 mL Intracatheter Q12H  sodium chloride flush  3 mL Intravenous Q12H   spironolactone  25 mg Oral Daily    SUBJECTIVE:  Afebrile overnight with no acute events.  Allergies  Allergen Reactions   Aspirin Anaphylaxis and Hives    Pt states that she is unable to take because she had bleeding in her brain   Other Other (See Comments)    Pt states that she is unable to take blood thinners because she had bleeding in her brain; Blood Thinners-per doctor at [facility] (Xarelto)     Review of Systems: Review of Systems  Constitutional:  Negative for chills, fever and weight loss.  Respiratory:  Negative for cough, shortness of breath and wheezing.   Cardiovascular:   Negative for chest pain and leg swelling.  Gastrointestinal:  Negative for abdominal pain, constipation, diarrhea, nausea and vomiting.  Skin:  Negative for rash.     OBJECTIVE: Vitals:   07/04/21 0745 07/04/21 0800 07/04/21 0938 07/04/21 1105  BP:    (!) 117/49  Pulse: 70 70 70 70  Resp: (!) 6 15 (!) 26 19  Temp: 98.8 F (37.1 C)     TempSrc: Oral     SpO2: 93% 90% 94% 94%  Weight:      Height:       Body mass index is 39.14 kg/m.  Physical Exam Constitutional:      General: She is not in acute distress.    Appearance: She is well-developed.     Comments: Lying in chair beside the bed; pleasant.   Cardiovascular:     Rate and Rhythm: Normal rate. Rhythm irregular.     Heart sounds: Normal heart sounds.     Comments: PICC in right upper extremity.  Pulmonary:     Effort: Pulmonary effort is normal.     Breath sounds: Normal breath sounds.  Skin:    General: Skin is warm and dry.  Neurological:     Mental Status: She is alert and oriented to person, place, and time.  Psychiatric:        Behavior: Behavior normal.        Thought Content: Thought content normal.        Judgment: Judgment normal.    Lab Results Lab Results  Component Value Date   WBC 19.0 (H) 07/04/2021   HGB 8.2 (L) 07/04/2021   HCT 26.1 (L) 07/04/2021   MCV 87.9 07/04/2021   PLT 176 07/04/2021    Lab Results  Component Value Date   CREATININE 1.45 (H) 07/04/2021   BUN 16 07/04/2021   NA 131 (L) 07/04/2021   K 4.4 07/04/2021   CL 89 (L) 07/04/2021   CO2 31 07/04/2021    Lab Results  Component Value Date   ALT 9 06/30/2021   AST 11 (L) 06/30/2021   ALKPHOS 99 06/30/2021   BILITOT 0.9 06/30/2021     Microbiology: Recent Results (from the past 240 hour(s))  Blood culture (routine x 2)     Status: Abnormal   Collection Time: 06/25/21  2:33 AM   Specimen: BLOOD RIGHT FOREARM  Result Value Ref Range Status   Specimen Description   Final    BLOOD RIGHT FOREARM Performed at [facility], [address], [place], [place] [address]    Special Requests   Final    BOTTLES DRAWN AEROBIC AND ANAEROBIC Blood Culture results may not be optimal due to an inadequate volume of blood received in culture bottles Performed at [facility], [address] [place], [place] [address]    Culture  Setup Time   Final    GRAM POSITIVE COCCI IN BOTH AEROBIC AND ANAEROBIC BOTTLES Organism ID to follow CRITICAL RESULT CALLED TO, READ BACK BY AND VERIFIED WITHArdeen Garland PHARMD [id number] 06/25/21 HNM Performed at [facility], [place], [place] [address]    Culture STREPTOCOCCUS GROUP G (A)  Final   Report Status 06/28/2021 FINAL  Final   Organism ID, Bacteria STREPTOCOCCUS GROUP G  Final      Susceptibility   Streptococcus group g - MIC*    CLINDAMYCIN RESISTANT Resistant     AMPICILLIN <=0.25 SENSITIVE Sensitive     ERYTHROMYCIN 2 RESISTANT Resistant     VANCOMYCIN 0.5 SENSITIVE Sensitive     CEFTRIAXONE <=0.12 SENSITIVE Sensitive     LEVOFLOXACIN 0.5 SENSITIVE Sensitive     PENICILLIN Value in next row Sensitive      SENSITIVE0.06    * STREPTOCOCCUS GROUP G  Blood Culture ID Panel (Reflexed)     Status: Abnormal   Collection Time: 06/25/21  2:33 AM  Result Value Ref Range Status   Enterococcus faecalis NOT DETECTED NOT DETECTED Final   Enterococcus Faecium NOT DETECTED NOT DETECTED Final   Listeria monocytogenes NOT DETECTED NOT DETECTED Final   Staphylococcus species NOT DETECTED NOT DETECTED Final   Staphylococcus aureus (BCID) NOT DETECTED NOT DETECTED Final   Staphylococcus epidermidis NOT DETECTED NOT DETECTED Final   Staphylococcus lugdunensis NOT DETECTED NOT DETECTED Final   Streptococcus species DETECTED (A) NOT DETECTED Final    Comment: Not Enterococcus species, Streptococcus agalactiae, Streptococcus pyogenes, or Streptococcus pneumoniae. CRITICAL RESULT CALLED TO, READ BACK BY AND VERIFIED WITH: Stephen TMAUQJ [id number]  06/25/21 HNM    Streptococcus agalactiae NOT DETECTED NOT DETECTED Final   Streptococcus pneumoniae NOT DETECTED NOT DETECTED Final   Streptococcus pyogenes NOT DETECTED NOT DETECTED Final   A.calcoaceticus-baumannii NOT DETECTED NOT DETECTED Final   Bacteroides fragilis NOT DETECTED NOT DETECTED Final   Enterobacterales NOT DETECTED NOT DETECTED Final   Enterobacter cloacae complex NOT DETECTED NOT DETECTED Final   Escherichia coli NOT DETECTED NOT DETECTED Final   Klebsiella aerogenes NOT DETECTED NOT DETECTED Final   Klebsiella oxytoca NOT DETECTED NOT DETECTED Final   Klebsiella pneumoniae NOT DETECTED NOT DETECTED Final   Proteus species NOT DETECTED NOT DETECTED Final   Salmonella species NOT DETECTED NOT DETECTED Final   Serratia marcescens NOT DETECTED NOT DETECTED Final   Haemophilus influenzae NOT DETECTED NOT DETECTED Final   Neisseria meningitidis NOT DETECTED NOT DETECTED Final   Pseudomonas aeruginosa NOT DETECTED NOT DETECTED Final   Stenotrophomonas maltophilia NOT DETECTED NOT DETECTED Final   Candida albicans NOT DETECTED NOT DETECTED Final   Candida auris NOT DETECTED NOT DETECTED Final   Candida glabrata NOT DETECTED NOT DETECTED Final   Candida krusei NOT DETECTED NOT DETECTED Final   Candida parapsilosis NOT DETECTED NOT DETECTED Final   Candida tropicalis NOT DETECTED NOT DETECTED Final   Cryptococcus neoformans/gattii NOT DETECTED NOT DETECTED Final    Comment: Performed at [facility], [address] [place], [place] [address]  Resp Panel by RT-PCR (Flu A&B, Covid) Nasopharyngeal Swab     Status: None   Collection Time: 06/25/21  3:33 AM   Specimen: Nasopharyngeal Swab; Nasopharyngeal(NP) swabs in vial transport medium  Result Value Ref Range Status   SARS Coronavirus 2 by RT PCR NEGATIVE NEGATIVE Final    Comment: (NOTE) SARS-CoV-2 target  nucleic acids are NOT DETECTED.  The SARS-CoV-2 RNA is generally detectable in upper respiratory specimens  during the acute phase of infection. The lowest concentration of SARS-CoV-2 viral copies this assay can detect is 138 copies/mL. A negative result does not preclude SARS-Cov-2 infection and should not be used as the sole basis for treatment or other patient management decisions. A negative result may occur with  improper specimen collection/handling, submission of specimen other than nasopharyngeal swab, presence of viral mutation(s) within the areas targeted by this assay, and inadequate number of viral copies(<138 copies/mL). A negative result must be combined with clinical observations, patient history, and epidemiological information. The expected result is Negative.  Fact Sheet for Patients:  EntrepreneurPulse.com.au  Fact Sheet for Healthcare Providers:  IncredibleEmployment.be  This test is no t yet approved or cleared by the [place] FDA and  has been authorized for detection and/or diagnosis of SARS-CoV-2 by FDA under an Emergency Use Authorization (EUA). This EUA will remain  in effect (meaning this test can be used) for the duration of the COVID-19 declaration under Section 564(b)(1) of the Act, 21 U.S.C.section 360bbb-3(b)(1), unless the authorization is terminated  or revoked sooner.       Influenza A by PCR NEGATIVE NEGATIVE Final   Influenza B by PCR NEGATIVE NEGATIVE Final    Comment: (NOTE) The Xpert Xpress SARS-CoV-2/FLU/RSV plus assay is intended as an aid in the diagnosis of influenza from Nasopharyngeal swab specimens and should not be used as a sole basis for treatment. Nasal washings and aspirates are unacceptable for Xpert Xpress SARS-CoV-2/FLU/RSV testing.  Fact Sheet for Patients: EntrepreneurPulse.com.au  Fact Sheet for Healthcare Providers: IncredibleEmployment.be  This test is not yet approved or cleared by the [place] FDA and has been authorized for detection  and/or diagnosis of SARS-CoV-2 by FDA under an Emergency Use Authorization (EUA). This EUA will remain in effect (meaning this test can be used) for the duration of the COVID-19 declaration under Section 564(b)(1) of the Act, 21 U.S.C. section 360bbb-3(b)(1), unless the authorization is terminated or revoked.  Performed at [facility], [address] [place], [place] [address]   Blood culture (routine x 2)     Status: Abnormal   Collection Time: 06/25/21  5:55 AM   Specimen: BLOOD  Result Value Ref Range Status   Specimen Description   Final    BLOOD LEFT ARM Performed at [facility], [address], [place], [place] [address]    Special Requests   Final    BOTTLES DRAWN AEROBIC AND ANAEROBIC Blood Culture adequate volume Performed at [facility], [address], [place], [place] [address]    Culture  Setup Time   Final    GRAM POSITIVE COCCI IN BOTH AEROBIC AND ANAEROBIC BOTTLES CRITICAL VALUE NOTED.  VALUE IS CONSISTENT WITH PREVIOUSLY REPORTED AND CALLED VALUE. Performed at [facility], [address] [place], [place] [address]    Culture (A)  Final    STREPTOCOCCUS GROUP G SUSCEPTIBILITIES PERFORMED ON PREVIOUS CULTURE WITHIN THE LAST 5 DAYS. Performed at [facility], [place] [address], [place], [place] [address]    Report Status 06/28/2021 FINAL  Final  Urine Culture     Status: Abnormal   Collection Time: 06/25/21  6:55 AM   Specimen: Urine, Clean Catch  Result Value Ref Range Status   Specimen Description   Final    URINE, CLEAN CATCH Performed at [facility], [address], [place],  [address]  Special Requests   Final    NONE Performed at [facility], [address] [place], [place] [address]    Culture >=100,000 COLONIES/mL KLEBSIELLA PNEUMONIAE (A)  Final   Report Status 06/27/2021 FINAL  Final   Organism ID, Bacteria KLEBSIELLA PNEUMONIAE (A)  Final      Susceptibility    Klebsiella pneumoniae - MIC*    AMPICILLIN RESISTANT Resistant     CEFAZOLIN <=4 SENSITIVE Sensitive     CEFEPIME <=0.12 SENSITIVE Sensitive     CEFTRIAXONE <=0.25 SENSITIVE Sensitive     CIPROFLOXACIN <=0.25 SENSITIVE Sensitive     GENTAMICIN <=1 SENSITIVE Sensitive     IMIPENEM <=0.25 SENSITIVE Sensitive     NITROFURANTOIN 32 SENSITIVE Sensitive     TRIMETH/SULFA <=20 SENSITIVE Sensitive     AMPICILLIN/SULBACTAM <=2 SENSITIVE Sensitive     PIP/TAZO <=4 SENSITIVE Sensitive     * >=100,000 COLONIES/mL KLEBSIELLA PNEUMONIAE  C Difficile Quick Screen w PCR reflex     Status: None   Collection Time: 06/25/21  6:06 PM   Specimen: Stool  Result Value Ref Range Status   C Diff antigen NEGATIVE NEGATIVE Final   C Diff toxin NEGATIVE NEGATIVE Final   C Diff interpretation No C. difficile detected.  Final    Comment: Performed at [facility], [address] [place], [place] [address]  MRSA Next Gen by PCR, Nasal     Status: None   Collection Time: 06/25/21 10:55 PM   Specimen: Nasal Mucosa; Nasal Swab  Result Value Ref Range Status   MRSA by PCR Next Gen NOT DETECTED NOT DETECTED Final    Comment: (NOTE) [name] (FDA approved for NASAL specimens only), is one component of a comprehensive MRSA colonization surveillance program. It is not intended to diagnose MRSA infection nor to guide or monitor treatment for MRSA infections. Test performance is not FDA approved in patients less than 53 years old. Performed at [facility], [address] [place], [place] [address]   Culture, blood (Routine X 2) w Reflex to ID Panel     Status: None   Collection Time: 06/27/21  8:29 AM   Specimen: BLOOD RIGHT HAND  Result Value Ref Range Status   Specimen Description BLOOD RIGHT HAND  Final   Special Requests   Final    BOTTLES DRAWN AEROBIC AND ANAEROBIC Blood Culture adequate volume   Culture   Final    NO GROWTH 5 DAYS Performed at [facility],  [address] [place], [place] [address]    Report Status 07/02/2021 FINAL  Final  Culture, blood (Routine X 2) w Reflex to ID Panel     Status: None   Collection Time: 06/27/21  8:29 AM   Specimen: BLOOD RIGHT HAND  Result Value Ref Range Status   Specimen Description BLOOD RIGHT HAND  Final   Special Requests   Final    BOTTLES DRAWN AEROBIC ONLY Blood Culture adequate volume   Culture   Final    NO GROWTH 5 DAYS Performed at [facility], [address] [place], [place] [address]    Report Status 07/02/2021 FINAL  Final     Terri Piedra, NP [facility]  07/04/2021  12:28 PM

## 2021-07-04 NOTE — Procedures (Signed)
Electrical Cardioversion Procedure Note Debra Saunders [id number] 08/28/1945  Procedure: Electrical Cardioversion Indications:  Atrial Fibrillation  Procedure Details Consent: Risks of procedure as well as the alternatives and risks of each were explained to the (patient/caregiver).  Consent for procedure obtained. Time Out: Verified patient identification, verified procedure, site/side was marked, verified correct patient position, special equipment/implants available, medications/allergies/relevent history reviewed, required imaging and test results available.  Performed  Patient placed on cardiac monitor, pulse oximetry, supplemental oxygen as necessary.  Sedation given:  Propofol per anesthesiology Pacer pads placed anterior and posterior chest.  Cardioverted 1 time(s).  Cardioverted at [place].  Evaluation Findings: Post procedure EKG shows: NSR Complications: None Patient did tolerate procedure well.   Debra Saunders 07/04/2021, 1:40 PM

## 2021-07-04 NOTE — NC FL2 (Signed)
[place] LEVEL OF CARE SCREENING TOOL     IDENTIFICATION  Patient Name: Debra Saunders Birthdate: 1945/07/16 Sex: female Admission Date (Current Location): 06/29/2021  [facility] and [place] Number:  Herbalist and Address:  [facility] [place], [place] [address] [place], [place] [address]      Provider Number: [id number]  Attending Physician Name and Address:  Mendel Corning, MD  Relative Name and Phone Number:       Current Level of Care: Hospital Recommended Level of Care: [place] Prior Approval Number:    Date Approved/Denied:   PASRR Number: [id number]  Discharge Plan: SNF    Current Diagnoses: Patient Active Problem List   Diagnosis Date Noted   Pressure injury of skin 07/01/2021   Acute blood loss anemia 06/29/2021   klebsiella pneumoniae UTI 06/29/2021   Septic shock ([place]) 06/28/2021   Sepsis due to pneumonia ([place]) 06/25/2021   Multifocal pneumonia 06/25/2021   GI bleeding 06/25/2021   Black stool 06/25/2021   Severe sepsis with septic shock secondary to infected pacemaker with streptoccos bacteremia  06/25/2021   HTN (hypertension)    HLD (hyperlipidemia)    Chronic diastolic CHF (congestive heart failure) ([place])    Acute renal failure superimposed on stage 3a chronic kidney disease ([place])    DVT (deep venous thrombosis) ([place])    Hyponatremia    Thrombocytopenia ([place])    Diarrhea    AKI (acute kidney injury) ([place])    Upper respiratory infection 05/24/2021   Burn 04/30/2020   Obesity (BMI 30-39.9) 04/30/2020   Elevated alkaline phosphatase level 04/30/2020   Chronic diarrhea 11/04/2019   Dysuria 09/29/2019   Abnormal mammogram 09/29/2019   Breast pain, right 08/08/2019   Bilateral ovarian cysts 08/08/2019   B12 deficiency 08/02/2019   Iron deficiency 08/01/2019   Dizziness 07/06/2019   Shortness of breath    Atrial fibrillation with tachy brady syndrome  09/22/2017   Acute bilateral low back  pain without sciatica 01/28/2017   Pain in limb 07/22/2016   Neuropathy 06/09/2016   UTI (urinary tract infection) 03/03/2016   Chronic deep vein thrombosis (DVT) ([place]) 03/03/2016   Chronic back pain 10/19/2015   Goiter 04/25/2015   Cervical disc disorder with radiculopathy of cervical region 03/30/2015   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   Interstitial lung disease ([place]) 04/06/2014   Abnormality of gait 01/13/2014   Sinoatrial node dysfunction ([place]) 01/09/2014   Allergic rhinitis 10/26/2013   Lymphedema 08/03/2013   Constipation 04/12/2013   Costochondritis 01/19/2013   Fatigue 09/27/2012   Normocytic anemia 04/28/2012   Acute on chronic respiratory failure with hypoxia ([place]) 04/26/2012   OSA (obstructive sleep apnea) 04/22/2012   Diabetes ([place]) 03/30/2012   Pulmonary hypertension, unspecified ([place]) 03/30/2012   Hyperlipidemia 03/30/2012   Hypertension     Orientation RESPIRATION BLADDER Height & Weight     Self, Time, Situation, Place  O2 (4L) Incontinent, External catheter Weight: 200 lb 6.4 oz (90.9 kg) Height:  5' (152.4 cm)  BEHAVIORAL SYMPTOMS/MOOD NEUROLOGICAL BOWEL NUTRITION STATUS      Continent Diet (Please see DC summary)  AMBULATORY STATUS COMMUNICATION OF NEEDS Skin   Limited Assist Verbally PU Stage and Appropriate Care                       Personal Care Assistance Level of Assistance  Bathing, Feeding, Dressing Bathing Assistance: Limited assistance Feeding assistance: Independent Dressing Assistance: Limited assistance  Functional Limitations Info  Sight, Hearing, Speech Sight Info: Adequate Hearing Info: Adequate Speech Info: Adequate    SPECIAL CARE FACTORS FREQUENCY  PT (By licensed PT), OT (By licensed OT)     PT Frequency: 5x/week OT Frequency: 5x/week            Contractures Contractures Info: Not present    Additional Factors Info  Code Status, Allergies, Insulin Sliding Scale Code Status Info: FULL Allergies  Info: Aspirin, Other   Insulin Sliding Scale Info: Please see med list       Current Medications (07/04/2021):  This is the current hospital active medication list Current Facility-Administered Medications  Medication Dose Route Frequency Provider Last Rate Last Admin   0.9 %  sodium chloride infusion  250 mL Intravenous PRN Orma Flaming, MD       acetaminophen (TYLENOL) tablet 650 mg  650 mg Oral Q6H PRN Orma Flaming, MD   650 mg at 06/30/21 0008   Or   acetaminophen (TYLENOL) suppository 650 mg  650 mg Rectal Q6H PRN Orma Flaming, MD       albuterol (PROVENTIL) (2.5 MG/3ML) 0.083% nebulizer solution 3 mL  3 mL Nebulization Q6H PRN Orma Flaming, MD       amiodarone (NEXTERONE PREMIX) 360-4.14 MG/200ML-% (1.8 mg/mL) IV infusion  30 mg/hr Intravenous Continuous Florencia Reasons, MD 16.67 mL/hr at 07/04/21 1318 30 mg/hr at 07/04/21 1318   apixaban (ELIQUIS) tablet 5 mg  5 mg Oral BID Larey Dresser, MD   5 mg at 07/04/21 1006   capsaicin (ZOSTRIX) 0.025 % cream   Topical BID Rai, Ripudeep Raliegh Ip, MD   Given at 07/04/21 1007   Chlorhexidine Gluconate Cloth 2 % PADS 6 each  6 each Topical Daily Rai, Ripudeep K, MD   6 each at 07/04/21 1110   cholestyramine light (PREVALITE) packet 4 g  4 g Oral Daily PRN Florencia Reasons, MD   4 g at 07/01/21 2143   cyclopentolate (CYCLODRYL) 2 % ophthalmic solution 1 drop  1 drop Both Eyes QHS Shalhoub, Sherryll Burger, MD   1 drop at 07/03/21 2354   Difluprednate 0.05 % EMUL 1 drop  1 drop Both Eyes BID Orma Flaming, MD   1 drop at 07/04/21 1007   digoxin (LANOXIN) tablet 0.125 mg  0.125 mg Oral Daily Bensimhon, Shaune Pascal, MD   0.125 mg at 07/04/21 1006   diphenhydrAMINE (BENADRYL) injection 25 mg  25 mg Intravenous Once PRN Laurice Record, MD       DULoxetine (CYMBALTA) DR capsule 60 mg  60 mg Oral Daily Orma Flaming, MD   60 mg at 07/04/21 1006   EPINEPHrine (EPI-PEN) injection 0.3 mg  0.3 mg Intramuscular Once PRN Laurice Record, MD       gabapentin (NEURONTIN) capsule 600  mg  600 mg Oral TID Orma Flaming, MD   600 mg at 07/04/21 1005   hydrALAZINE (APRESOLINE) injection 10 mg  10 mg Intravenous Q4H PRN Bensimhon, Shaune Pascal, MD       HYDROcodone-acetaminophen (NORCO/VICODIN) 5-325 MG per tablet 1-2 tablet  1-2 tablet Oral Q4H PRN Orma Flaming, MD   1 tablet at 07/03/21 1705   insulin aspart (novoLOG) injection 0-9 Units  0-9 Units Subcutaneous TID WC Orma Flaming, MD   1 Units at 07/04/21 0640   macitentan (OPSUMIT) tablet 10 mg  10 mg Oral Daily Bensimhon, Shaune Pascal, MD   10 mg at 07/04/21 1005   morphine 2 MG/ML injection 1 mg  1 mg Intravenous  Q3H PRN Orma Flaming, MD   1 mg at 06/29/21 1308   pantoprazole (PROTONIX) injection 40 mg  40 mg Intravenous Q24H Orma Flaming, MD   40 mg at 07/04/21 1005   penicillin G potassium 8 Million Units in dextrose 5 % 500 mL continuous infusion  8 Million Units Intravenous Q12H Laurice Record, MD       polyethylene glycol (MIRALAX / GLYCOLAX) packet 17 g  17 g Oral BID Regalado, Belkys A, MD   17 g at 07/03/21 0825   rosuvastatin (CRESTOR) tablet 10 mg  10 mg Oral Daily Orma Flaming, MD   10 mg at 07/04/21 1005   Selexipag TABS 1,600 mcg  1,600 mcg Oral BID Joselyn Glassman A, RPH   1,600 mcg at 07/04/21 1006   sildenafil (REVATIO) tablet 20 mg  20 mg Oral TID Bensimhon, Shaune Pascal, MD   20 mg at 07/04/21 1005   sodium chloride flush (NS) 0.9 % injection 10-40 mL  10-40 mL Intracatheter Q12H Rai, Ripudeep K, MD   10 mL at 07/03/21 2119   sodium chloride flush (NS) 0.9 % injection 3 mL  3 mL Intravenous Q12H Orma Flaming, MD   3 mL at 07/03/21 0829   sodium chloride flush (NS) 0.9 % injection 3 mL  3 mL Intravenous PRN Orma Flaming, MD       spironolactone (ALDACTONE) tablet 25 mg  25 mg Oral Daily Bensimhon, Shaune Pascal, MD   25 mg at 07/04/21 1006     Discharge Medications: Please see discharge summary for a list of discharge medications.  Relevant Imaging Results:  Relevant Lab Results:   Additional  Information SSN#: [id number] [place] COVID-19 Vaccine 10/17/2019 , 09/23/2019  Ingra Rother, LCSW

## 2021-07-04 NOTE — Progress Notes (Signed)
Patient via bed to Endo for Tee and Cardioversion

## 2021-07-04 NOTE — Anesthesia Preprocedure Evaluation (Signed)
Anesthesia Evaluation  Patient identified by MRN, date of birth, ID band Patient awake    Reviewed: Allergy & Precautions, NPO status , Patient's Chart, lab work & pertinent test results  Airway Mallampati: III  TM Distance: >3 FB Neck ROM: Limited    Dental  (+) Dental Advisory Given, Poor Dentition, Missing   Pulmonary sleep apnea and Oxygen sleep apnea , former smoker,    Pulmonary exam normal breath sounds clear to auscultation       Cardiovascular hypertension, +CHF  + dysrhythmias Atrial Fibrillation + pacemaker  Rhythm:Irregular Rate:Abnormal     Neuro/Psych  Neuromuscular disease CVA    GI/Hepatic negative GI ROS, Neg liver ROS,   Endo/Other  diabetesMorbid obesity  Renal/GU Renal InsufficiencyRenal disease     Musculoskeletal  (+) Arthritis ,   Abdominal   Peds  Hematology  (+) Blood dyscrasia (eliquis), ,   Anesthesia Other Findings Day of surgery medications reviewed with the patient.  Reproductive/Obstetrics                             Anesthesia Physical Anesthesia Plan  ASA: 3  Anesthesia Plan: MAC   Post-op Pain Management:    Induction: Intravenous  PONV Risk Score and Plan: 2 and TIVA and Treatment may vary due to age or medical condition  Airway Management Planned: Nasal Cannula and Natural Airway  Additional Equipment:   Intra-op Plan:   Post-operative Plan:   Informed Consent: I have reviewed the patients History and Physical, chart, labs and discussed the procedure including the risks, benefits and alternatives for the proposed anesthesia with the patient or authorized representative who has indicated his/her understanding and acceptance.     Dental advisory given  Plan Discussed with: CRNA and Anesthesiologist  Anesthesia Plan Comments:         Anesthesia Quick Evaluation

## 2021-07-04 NOTE — Progress Notes (Signed)
PT Cancellation Note  Patient Details Name: Debra Saunders MRN: [id number] DOB: 1946-02-04   Cancelled Treatment:    Reason Eval/Treat Not Completed: Patient at procedure or test/unavailable (TEE, cardioversion). Will follow-up for PT treatment tomorrow. Per Occupational Therapy, pt now agreeable for SNF which is an appropriate recommendation but pt had initially adamantly declined this.  Mabeline Caras, PT, DPT Acute Rehabilitation Services  Pager [phone] Office [place] 07/04/2021, 1:35 PM

## 2021-07-04 NOTE — H&P (View-Only) (Signed)
Advanced Heart Failure Rounding Note   Subjective:    Bld cultures 1/5---> No growth.   1/9- EP consulted. Not a candidate for extraction d/t comorbid conditions.   PICC line placed 01/11 for home abx  AF 70s. On amio gtt at 30/hr. Anticoagulated with Eliquis.   Yesterday 1,300 cc UOP + unmeasured void  Denies dyspnea. No CP. Fatigued. Declined OT yesterday   Objective:     Vital Signs:   Temp:  [97.8 F (36.6 C)-99.6 F (37.6 C)] 99.6 F (37.6 C) (01/11 2309) Pulse Rate:  [69-74] 69 (01/12 0400) Resp:  [14-28] 17 (01/12 0400) BP: (92-127)/(45-75) 127/51 (01/12 0400) SpO2:  [91 %-98 %] 93 % (01/12 0400) Weight:  [90.9 kg] 90.9 kg (01/12 0500) Last BM Date: 06/29/21  Weight change: Filed Weights   07/01/21 0613 07/02/21 0500 07/04/21 0500  Weight: 88.4 kg 92.3 kg 90.9 kg    Intake/Output:   Intake/Output Summary (Last 24 hours) at 07/04/2021 0655 Last data filed at 07/04/2021 0526 Gross per 24 hour  Intake 1205.6 ml  Output 1300 ml  Net -94.4 ml     Physical Exam: General:  Elderly female lying comfortably in bed HEENT: normal Neck: supple. no JVD. Carotids 2+ bilat; no bruits.  Cor: PMI nondisplaced. Irregular rhythm. No rubs, gallops or murmurs. Lungs: clear Abdomen: soft, nontender, nondistended. No hepatosplenomegaly. No bruits or masses. Good bowel sounds. Extremities: no cyanosis, clubbing, rash, chronic LLE lymphedema, TED hose on Neuro: alert & orientedx3, cranial nerves grossly intact. moves all 4 extremities w/o difficulty. Affect pleasant   Telemetry: Afib 70s, intermittent V pacing, short runs of NSVT up to 5 beats   Labs: Basic Metabolic Panel: Recent Labs  Lab 06/29/21 1254 06/30/21 0522 07/01/21 0011 07/02/21 0142 07/03/21 0051  NA 140 138 137 133* 131*  K 3.9 3.9 3.4* 4.0 4.2  CL 96* 94* 95* 92* 92*  CO2 34* 34* 35* 35* 32  GLUCOSE 226* 175* 176* 187* 191*  BUN 31* 19 17 13 14   CREATININE 0.96 0.80 0.86 1.02* 1.27*   CALCIUM 8.3* 8.1* 8.0* 7.8* 7.9*  MG  --   --  1.8 1.6* 2.1    Liver Function Tests: Recent Labs  Lab 06/28/21 0500 06/29/21 1254 06/29/21 1608 06/30/21 0522  AST 13* 14* 13* 11*  ALT 12 11 12 9   ALKPHOS 123 126 122 99  BILITOT 1.0 1.1 0.9 0.9  PROT 6.3* 6.4* 6.8 5.8*  ALBUMIN 2.4* 2.3* 2.3* 2.0*   No results for input(s): LIPASE, AMYLASE in the last 168 hours.  No results for input(s): AMMONIA in the last 168 hours.  CBC: Recent Labs  Lab 06/28/21 0500 06/29/21 0507 06/29/21 1254 06/30/21 0522 07/01/21 0011 07/02/21 0142 07/03/21 0051  WBC 9.3 15.8* 18.3* 20.6* 19.7* 18.3* 19.6*  NEUTROABS 7.3 11.9*  --   --   --   --   --   HGB 7.7* 8.0* 8.9* 8.0* 8.6* 8.0* 8.0*  HCT 23.4* 24.4* 27.1* 24.5* 27.8* 24.9* 26.3*  MCV 83.9 83.6 84.2 85.1 86.3 84.7 86.8  PLT 118* 135* 155 166 208 203 196    Cardiac Enzymes: No results for input(s): CKTOTAL, CKMB, CKMBINDEX, TROPONINI in the last 168 hours.  BNP: BNP (last 3 results) Recent Labs    08/24/20 1506 03/27/21 1530 06/25/21 1350  BNP 51.2 31.1 344.7*    ProBNP (last 3 results) No results for input(s): PROBNP in the last 8760 hours.    Other results:  Imaging: DG  Chest Port 1 View  Result Date: 07/03/2021 CLINICAL DATA:  RIGHT PICC line inserted EXAM: PORTABLE CHEST 1 VIEW COMPARISON:  07/12/2020 FINDINGS: Interval placement RIGHT PICC line with tip at the RIGHT cavoatrial junction. LEFT-sided pacer noted. Stable cardiac silhouette. Mild pulmonary edema pattern. IMPRESSION: 1. RIGHT PICC line insertion with tip at the cavoatrial junction. 2. Mild pulmonary edema pattern. Electronically Signed   By: Suzy Bouchard M.D.   On: 07/03/2021 11:54   [place] EKG SITE RITE  Result Date: 07/02/2021 If Site Rite image not attached, placement could not be confirmed due to current cardiac rhythm.    Medications:     Scheduled Medications:  apixaban  5 mg Oral BID   capsaicin   Topical BID   Chlorhexidine Gluconate  Cloth  6 each Topical Daily   cyclopentolate  1 drop Both Eyes QHS   Difluprednate  1 drop Both Eyes BID   digoxin  0.125 mg Oral Daily   DULoxetine  60 mg Oral Daily   gabapentin  600 mg Oral TID   insulin aspart  0-9 Units Subcutaneous TID WC   macitentan  10 mg Oral Daily   pantoprazole (PROTONIX) IV  40 mg Intravenous Q24H   polyethylene glycol  17 g Oral BID   rosuvastatin  10 mg Oral Daily   Selexipag  1,600 mcg Oral BID   sildenafil  20 mg Oral TID   sodium chloride flush  10-40 mL Intracatheter Q12H   sodium chloride flush  3 mL Intravenous Q12H   spironolactone  25 mg Oral Daily   torsemide  20 mg Oral QPM   torsemide  40 mg Oral Daily    Infusions:  sodium chloride     sodium chloride 20 mL/hr at 07/04/21 0500   amiodarone 30 mg/hr (07/04/21 0516)   cefTRIAXone (ROCEPHIN)  IV Stopped (07/03/21 1629)    PRN Medications: sodium chloride, acetaminophen **OR** acetaminophen, albuterol, cholestyramine light, diphenhydrAMINE, EPINEPHrine, hydrALAZINE, HYDROcodone-acetaminophen, morphine injection, sodium chloride flush   Assessment/Plan:   1. 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 - shock resolved - ID following  - Bld Cx 1/5 - No growth  - ID has seen. Continue ceftriaxone. Will need IV abx X 6 weeks followed by suppressive antibiotics.  - PICC placed 01/11 - EP did not recommend extraction due to co-morbid conditions.    2. Acute on chronichypoxic respiratory failure with CAP - has underlying ILD. On home O2 - ? Septic embolization to lung -  ID following  - Continue ceftriaxone - On 4 liters [place] which is her home regimen. O2 sats stable.    3. Pulmonary hypertension: She was [facility] the past [facility].  Based on 4/19 cath, she had severe PAH with PVR 7.2 WU. Chronic hypoxemic respiratory failure, on home  oxygen.  She has a history of remote DVT (2004) but V/Q scan negative for evidence of chronic PE.  High resolution CT chest in 10/19 showed pulmonary fibrosis, and PFTs in 2017 showed severe restriction.  Serological workup for rheumatological diseases was unremarkable.  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 Amiodarone was stopped given lung parenchymal disease. 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  - PH meds resumed sildenafil 20 tid, macitentan 10 daily -  Decision made to resume selexipeg at 800 bid (half PTA dose) given that she was off > 3 days. Now back up to 1,600 mcg BID - Continue digoxin for RV support - Continue CPAP and home oxygen.    4. PAF with tachy-brady syndrome - back in AF now - amio stopped in 2019 due to underlying lung disease.  - Failed rate control with oral cardizem. Cardizem discontinued. Amio gtt added with improved rate control.  - On apixaban PTA. Stopped due to concern over GIB on admit. Hgb stable in 8s.  - Back on Eliquis 5 mg BID - TEE/DCCV today   5. AKI due to ATN/shock - resolved - baseline Cr 1.03 - peak Scr  3.21 improved to 0.80, now creeping back up slowly > 1.0 > 1.27.  - BMET today    6. 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 - Volume status looks okay.  - Continue Torsemide to 40 mg am/20 mg qhs (on 40 mg BID prior to admit) - Monitor BP. Soft yesterday. Seems better this am after stopping cardizem. - Continue spiro 25 mg daily - consider SGLT2i prior to d/c - Scr 0.8 > 1 > 1.27, awaiting BMET   7. HTN - SBP intermittently soft but better this am, diastolic pressure on lower end. Monitor.   8. 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 DM2 - on insulin. - cover with SSI    10. OSA on CPAP -  continue CPAP  11. Hypokalemia/hypomagnesemia: - BMET today   12. Anemia  -Hgb has been in 8s -No labs this am. Check CBC -No obvious source.  -FOBT negative   Encouraged mobilization. Declined OT yesterday.  Will need HH PT at discharge. Declines SNF [facility] contacted by TOC for CPAP - home CPAP does not work Coordinate IV abx with [facility] Coordinator  Length of Stay: [place], LINDSAY N  NP-C  07/04/2021, 6:55 AM  Advanced Heart Failure Team Pager [phone] (M-F; [place])  Please contact [facility] for night-coverage after hours (4p -7a ) and weekends on [email or web address]   Patient seen with NP, agree with the above note.   Weight down, I/Os negative.  Creatinine mildly higher at 1.45.  Hgb stable 8.2, FOBT negative.  BP stable. Afebrile, WBCs stable at 19. Remains in atrial fibrillation.   She has been "tired," has not wanted to get out of bed.   General: NAD Neck: JVP 8 cm, no thyromegaly or thyroid nodule.  Lungs: Clear to auscultation bilaterally with normal respiratory effort. CV: Nondisplaced PMI.  Heart irregular S1/S2, no S3/S4, no murmur.  No peripheral edema.  N Abdomen: Soft, nontender, no hepatosplenomegaly, no distention.  Skin: Intact without lesions or rashes.  Neurologic: Alert and oriented x 3.  Psych: Normal affect. Extremities: No clubbing or cyanosis.  HEENT: Normal.   Creatinine mildly higher at 1.45.  Hold torsemide today (NPO for TEE/DCCV).     Continue her PH regimen.    She remains in atrial fibrillation with rate controlled on amiodarone gtt.  She is on apixaban.  - Plan for TEE-guided DCCV today.  Discussed risks/benefits with patient, she agrees to procedure.  - She remains on digoxin for RV support. Check level in am.    Continue ceftriaxone for endocarditis/pacer wire vegetation, group G strep.  Will need PICC eventually.   Hgb low but stable, no evidence for active GI bleeding (  FOBT negative).   2 person assist and not  working much with PT.  She does not want SNF but unsure that she can successfully go home.   [name] 07/04/2021 8:45 AM

## 2021-07-04 NOTE — Progress Notes (Signed)
Progress Note   Patient: Debra Saunders:[id number] DOB: 03/26/1946 DOA: 06/29/2021     5 DOS: the patient was seen and examined on 07/04/2021   Brief hospital course: 77 year old female with hypertension, hyperlipidemia, diabetes, SDH 2014, SA node dysfunction status post pacemaker, OSA, PAH, interstitial lung disease on chronic 4 L O2, diastolic CHF, A. fib on Eliquis, CKD stage III AAA, cardiorenal syndrome, DVT, initially presented to [facility] ED on 06/25/2021 with shortness of breath, cough, fevers, chills dysuria and right upper quadrant pain.  Patient was admitted with severe sepsis, septic shock secondary to multifocal pneumonia, septic emboli and strep bacteremia, Klebsiella UTI.  Initially required Levophed, stress dose steroids for short time.  TEE 1/6 consistent with infected pacemaker lead with vegetation.  Patient also required 1 unit of packed RBCs for hemoglobin of 7, no plans for endoscopy. Acute on chronic CKD has improved.  LFTs has improved (shock liver) Patient was transferred to [facility] on 1/7 for pacer extraction, EP cardiology and cardiology were consulted, not considered a candidate for transvenous lead extraction due to significant comorbidities.  Patient also developed A. fib with RVR and was started on amiodarone. ID is following, will need prolonged course of antibiotic treatment.  1/12: TEE/DCCV today  Assessment and Plan * Severe sepsis with septic shock secondary to infected pacemaker with streptoccos bacteremia - (present on admission) - Blood cultures 1/03 + Streptococcus group G -TEE 1/6 large mobile vegetation on the RV pacing wire noted in the right atrium with estimated 1x 1.5 cm in size or larger -Blood cultures 1/5 no growth -Septic shock physiology resolved, received IV Levophed and fluids. -Cardiology, ID following. -Per EP cardiology, not a candidate for transvenous lead extraction because of significant comorbid conditions. -Continue IV ceftriaxone.   ID following, will need prolonged course of antibiotic treatment followed by chronic suppression   Atrial fibrillation with tachy brady syndrome - (present on admission) -Patient was started on IV amiodarone on 1/8 due to atrial fibrillation with RVR.  Failed rate control with oral Cardizem. -Was on amiodarone but was stopped in 2019 due to lung disease.  Continue digoxin -Continue Eliquis 5 mg twice daily. -Plan for TEE/DCCV today.  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  History of anemia of chronic disease  -Presented with hgb 8>7 then transfused 1 unit PRBC -GI was consulted, deferred endoscopy  -H&H stable  DVT (deep venous thrombosis) (HCC)- (present on admission) -History of PE and DVT  -Initially Eliquis was placed on hold due to ABLA, now resumed -IVC filter was placed in 2015 at the time of pacemaker placement -Continue eliquis  Acute renal failure superimposed on stage 3a chronic kidney disease ([place])- (present on admission) -Creatinine peaked at 3.21, - creatinine trending up, 1.4 hold off on torsemide and Aldactone  Chronic diastolic CHF (congestive heart failure) ([place])- (present on admission) -2D echo 06/27/21: EF of 60-65% normal LVF. Diastolic parameters indeterminate -TEE this admission showed EF of 55 to 60%, RV normal, mild to moderate MR -Creatinine trending up, 1.4.  Holding torsemide, Aldactone, will recheck BMET in a.m.  Acute on chronic respiratory failure with hypoxia (HCC)- (present on admission) -Chronic respiratory failure secondary to ILD, pulmonary hypertension on 4L oxygen, originally required BiPAP, back to baseline -Has underlying OSA on CPAP nightly  Hyperlipidemia- (present on admission) - LFTs now normal, continue Crestor  Pulmonary hypertension, unspecified ([place])- (present on admission) -  Cardiology following, resumed sildenafil, macitentan,  selexipag - Continue digoxin, on Eliquis, CPAP and home O2  Hypertension- (present on admission) - BP stable.  Pressure injury of skin- (present on admission) Pressure Injury 06/29/21 Right Stage 1 -  Intact skin with non-blanchable redness of a localized area usually over a bony prominence. abrasion on right gluteal fold (Active)  06/29/21 1500  Location:   Location Orientation: Right  Staging: Stage 1 -  Intact skin with non-blanchable redness of a localized area usually over a bony prominence.  Wound Description (Comments): abrasion on right gluteal fold  Present on Admission: Yes       Diabetes ([place]) - Hemoglobin A1c of 5.4 on 06/25/2021 -Well-controlled, CBGs likely elevated in the setting of severe sepsis and steroids, continue sliding scale insulin -Place on SGLT2 inhibitor prior to discharge per cardiology  Recent Labs    07/03/21 0607 07/03/21 1145 07/03/21 1655 07/03/21 2113 07/04/21 0602 07/04/21 1106  GLUCAP 165* 158* 165* 177* 140* 130*    klebsiella pneumoniae UTI- (present on admission) - Currently no urinary symptoms, on IV Rocephin for infective endocarditis  Obesity (BMI 30-39.9)- (present on admission) BMI 39.7.  Healthy lifestyle recommended.  OSA (obstructive sleep apnea)- (present on admission) - Continue CPAP nightly     Subjective: No acute complaints, seen in a.m. awaiting TEE  Objective BP (!) 110/46 (BP Location: Left Wrist)    Pulse 61    Temp 98.9 F (37.2 C) (Oral)    Resp 20    Ht 5' (1.524 m)    Wt 90.9 kg    SpO2 90%    BMI 39.14 kg/m    Physical Exam General: Alert and oriented x 3, NAD Cardiovascular: S1 S2 clear, RRR. No pedal edema b/l Respiratory: CTAB, no wheezing,  Gastrointestinal: Soft, nontender, nondistended, NBS Ext: no pedal edema bilaterally Psych: Normal affect and demeanor, alert and oriented x3    Data Reviewed:  My review of labs, imaging, notes and other tests shows no new significant findings.    Family Communication:   Disposition: Status is: Inpatient  Remains inpatient appropriate because: TEE/DCCV today   Time spent: 35 minutes  Author: Estill Cotta, MD 07/04/2021 3:35 PM  For on call review [email or web address].

## 2021-07-04 NOTE — TOC CM/SW Note (Signed)
Spoke to pt and she has decided on SNF. HF TOC CM contacted [facility] IV infusion coordinator, Pam RN and [facility] rep, Stacie that pt has decided to go to SNF rehab and IV abx. [place], Heart Failure TOC CM [phone]

## 2021-07-04 NOTE — Transfer of Care (Signed)
Immediate Anesthesia Transfer of Care Note  Patient: Debra Saunders  Procedure(s) Performed: TRANSESOPHAGEAL ECHOCARDIOGRAM (TEE) CARDIOVERSION  Patient Location: PACU  Anesthesia Type:MAC  Level of Consciousness: awake and alert   Airway & Oxygen Therapy: Patient Spontanous Breathing and Patient connected to nasal cannula oxygen  Post-op Assessment: Report given to RN and Post -op Vital signs reviewed and stable  Post vital signs: Reviewed and stable  Last Vitals:  Vitals Value Taken Time  BP 114/34 07/04/21 1345  Temp 37.1 C 07/04/21 1346  Pulse 62 07/04/21 1347  Resp 21 07/04/21 1347  SpO2 93 % 07/04/21 1347  Vitals shown include unvalidated device data.  Last Pain:  Vitals:   07/04/21 1346  TempSrc: Oral  PainSc: 0-No pain      Patients Stated Pain Goal: 0 (50/03/70 4888)  Complications: No notable events documented.

## 2021-07-04 NOTE — Progress Notes (Signed)
Chaplain responded to a request for a spiritual consult and education pertaining to completing a advanced directive. In my conversation with the patient and family member they were both under the impression that the patient had completed one at an earlier point in time.  Before filling out another one, family member is going to check patients house and bring it in for the patient to review and have it submitted to be put into the hospital system.   If there is no advanced directive located the patient indicated that she did want to complete one and have it notarized and put in the system.   Wynona Neat Resident [place]  [phone]

## 2021-07-04 NOTE — Interval H&P Note (Signed)
History and Physical Interval Note:  07/04/2021 1:14 PM  Debra Saunders  has presented today for surgery, with the diagnosis of A-FIB.  The various methods of treatment have been discussed with the patient and family. After consideration of risks, benefits and other options for treatment, the patient has consented to  Procedure(s): TRANSESOPHAGEAL ECHOCARDIOGRAM (TEE) (N/A) CARDIOVERSION (N/A) as a surgical intervention.  The patient's history has been reviewed, patient examined, no change in status, stable for surgery.  I have reviewed the patient's chart and labs.  Questions were answered to the patient's satisfaction.     Brodey Bonn [employer]

## 2021-07-04 NOTE — TOC Progression Note (Addendum)
Transition of Care [facility]) - Progression Note    Patient Details  Name: CHARNETTA WULFF MRN: [id number] Date of Birth: 26-Mar-1946  [facility]) CM/SW Contact  Lerin Jech, LCSW Phone Number: 07/04/2021, 2:26 PM  Clinical Narrative:    HF CSW received consult for possible SNF placement at time of discharge. CSW spoke with Ms. Pecor at bedside and also completed a brief SDOH. Ms. Stapleton reported that she does have a PCP and is able to get her medications as she uses [facility] to help with her medications. Ms. Leidy reported that she isn't driving but that family and friends help and she also uses [facility] [employer] and is able to get food and pay her bills. Patient reported that she lives alone and is currently unable to care for herself at her home given her current physical needs and fall risk. Patient expressed understanding of PT recommendation and is agreeable to SNF placement at time of discharge. Patient reports preference for [facility] or [facility] in [place]. CSW discussed insurance authorization process and provided the Medicare SNF ratings list. Patient has received  COVID vaccines. CSW will send out referrals for review. Patient expressed being hopeful for rehab and to feel better soon. No further questions reported at this time.   Skilled Nursing Rehab Facilities-   [email or web address]   Ratings out of 5 possible   Name Address  Phone # [place] Inspection Overall  [facility] [address] [place] 5 5 2 4   [facility]  [address] [place], [place] [phone] 4 2 5 5   [facility] [place], [place] 4 1 1 1   [place], [place] 2 2 4 4   [facility] [address], [place] 2 1 1 1   [place] [address] [place] [phone] 3 1 4 3   [facility] [address] [place] 5 2 2 3   [facility] [address] [place] 4 1 2 1   [place] 5 1 2 2   [facility] [phone], [place] [phone] 4 1 1 1   [facility] [address], [facility] [phone] 4 1 2 1   [place] [phone] 4 1 1 1    2:57pm - HF CSW received a voicemail from the patient's daughter, [name]. CSW spoke with [name] at Ms. Iribe's bedside and she reiterated that she would like her mother to go to [place].  3:12pm - HF CSW spoke with Magda Paganini at [facility] and she reported she does have a bed available and will start insurance authorization. Ms. Cunanan will need a COVID test same day of discharge.  Patient's family asking for HCPOA paperwork and CSW asked the HF RNCM, Edwin Cap to put in a consult to the Chaplain to initiate HCPOA paperwork.   Expected Discharge Plan: Skilled Nursing Facility Barriers to Discharge: Continued Medical Work up  Expected Discharge Plan and Services Expected Discharge Plan: [place] In-house Referral: Clinical Social Work Discharge Planning Services: CM Consult Post Acute Care Choice: [place] arrangements for the past 2 months: Single Family Home                                       Social Determinants of  Health (SDOH) Interventions Food Insecurity Interventions: Intervention Not Indicated Financial Strain Interventions: Intervention Not Indicated Housing Interventions: Intervention Not Indicated Transportation Interventions: Financial planner, Other (Comment) (family and friends also help provide transportation)  Readmission Risk Interventions No flowsheet data found.  Aldean Pipe, MSW, [place] Heart Failure Social Worker

## 2021-07-04 NOTE — CV Procedure (Signed)
Procedure: TEE  Indication: Atrial fibrillation  Sedation: Per anesthesiology  Findings: Please see echo section for full report.    No LA appendage thrombus.   Vegetation still present on pacemaker lead in RA and on tricuspid valve.    Proceeding with DCCV.   [name] 07/04/2021 1:42 PM

## 2021-07-04 NOTE — Progress Notes (Signed)
PHARMACY CONSULT NOTE FOR:  OUTPATIENT  PARENTERAL ANTIBIOTIC THERAPY (OPAT)  Indication: Group G Streptococcus Bacteremia and Pacemaker Vegetation Regimen: Penicillin G IV 16 million units every 24 hours administered as a continuous infusion End date: 08/08/21  IV antibiotic discharge orders are pended. To discharging provider:  please sign these orders via discharge navigator,  Select New Orders & click on the button choice - Manage This Unsigned Work.     Thank you for allowing pharmacy to be a part of this patient's care.  Lestine Box, PharmD PGY2 Infectious Diseases Pharmacy Resident   Please check [email or web address] for unit-specific pharmacy phone numbers

## 2021-07-05 ENCOUNTER — Encounter (HOSPITAL_COMMUNITY): Payer: Self-pay | Admitting: Cardiology

## 2021-07-05 DIAGNOSIS — I480 Paroxysmal atrial fibrillation: Secondary | ICD-10-CM

## 2021-07-05 LAB — CBC
HCT: 22.3 % — ABNORMAL LOW (ref 36.0–46.0)
Hemoglobin: 6.8 g/dL — CL (ref 12.0–15.0)
MCH: 26.5 pg (ref 26.0–34.0)
MCHC: 30.5 g/dL (ref 30.0–36.0)
MCV: 86.8 fL (ref 80.0–100.0)
Platelets: 151 10*3/uL (ref 150–400)
RBC: 2.57 MIL/uL — ABNORMAL LOW (ref 3.87–5.11)
RDW: 14.8 % (ref 11.5–15.5)
WBC: 12.2 10*3/uL — ABNORMAL HIGH (ref 4.0–10.5)
nRBC: 0 % (ref 0.0–0.2)

## 2021-07-05 LAB — GLUCOSE, CAPILLARY
Glucose-Capillary: 125 mg/dL — ABNORMAL HIGH (ref 70–99)
Glucose-Capillary: 139 mg/dL — ABNORMAL HIGH (ref 70–99)
Glucose-Capillary: 154 mg/dL — ABNORMAL HIGH (ref 70–99)
Glucose-Capillary: 105 mg/dL — ABNORMAL HIGH (ref 70–99)

## 2021-07-05 LAB — BASIC METABOLIC PANEL
Anion gap: 5 (ref 5–15)
BUN: 15 mg/dL (ref 8–23)
CO2: 31 mmol/L (ref 22–32)
Calcium: 7.3 mg/dL — ABNORMAL LOW (ref 8.9–10.3)
Chloride: 95 mmol/L — ABNORMAL LOW (ref 98–111)
Creatinine, Ser: 1.4 mg/dL — ABNORMAL HIGH (ref 0.44–1.00)
GFR, Estimated: 39 mL/min — ABNORMAL LOW (ref >60)
Glucose, Bld: 126 mg/dL — ABNORMAL HIGH (ref 70–99)
Potassium: 3.9 mmol/L (ref 3.5–5.1)
Sodium: 131 mmol/L — ABNORMAL LOW (ref 135–145)

## 2021-07-05 LAB — IRON AND TIBC
Iron: 24 ug/dL — ABNORMAL LOW (ref 28–170)
Saturation Ratios: 10 % — ABNORMAL LOW (ref 10.4–31.8)
TIBC: 237 ug/dL — ABNORMAL LOW (ref 250–450)
UIBC: 213 ug/dL

## 2021-07-05 LAB — RETICULOCYTES
Immature Retic Fract: 18.4 % — ABNORMAL HIGH (ref 2.3–15.9)
RBC.: 2.65 MIL/uL — ABNORMAL LOW (ref 3.87–5.11)
Retic Count, Absolute: 89 10*3/uL (ref 19.0–186.0)
Retic Ct Pct: 3.4 % — ABNORMAL HIGH (ref 0.4–3.1)

## 2021-07-05 LAB — FERRITIN: Ferritin: 790 ng/mL — ABNORMAL HIGH (ref 11–307)

## 2021-07-05 LAB — PREPARE RBC (CROSSMATCH)

## 2021-07-05 LAB — FOLATE: Folate: 8.4 ng/mL (ref >5.9)

## 2021-07-05 LAB — VITAMIN B12: Vitamin B-12: 609 pg/mL (ref 180–914)

## 2021-07-05 MED ORDER — TORSEMIDE 20 MG PO TABS
40.0000 mg | ORAL_TABLET | Freq: Two times a day (BID) | ORAL | Status: DC
  Administered 2021-07-05 – 2021-07-08 (×7): 40 mg via ORAL
  Filled 2021-07-05 (×7): qty 2

## 2021-07-05 MED ORDER — BENZONATATE 100 MG PO CAPS
200.0000 mg | ORAL_CAPSULE | Freq: Three times a day (TID) | ORAL | Status: DC
  Administered 2021-07-05 – 2021-07-12 (×24): 200 mg via ORAL
  Filled 2021-07-05 (×24): qty 2

## 2021-07-05 MED ORDER — GUAIFENESIN-CODEINE 100-10 MG/5ML PO SOLN
5.0000 mL | ORAL | Status: DC | PRN
  Administered 2021-07-05 – 2021-07-11 (×6): 5 mL via ORAL
  Filled 2021-07-05 (×7): qty 5

## 2021-07-05 MED ORDER — SODIUM CHLORIDE 0.9% IV SOLUTION: Freq: Once | INTRAVENOUS | Status: DC

## 2021-07-05 MED ORDER — PHENOL 1.4 % MT LIQD
1.0000 | OROMUCOSAL | Status: DC | PRN
  Administered 2021-07-05 – 2021-07-06 (×2): 1 via OROMUCOSAL
  Filled 2021-07-05: qty 177

## 2021-07-05 MED ORDER — TORSEMIDE 20 MG PO TABS
40.0000 mg | ORAL_TABLET | Freq: Every day | ORAL | Status: DC
  Administered 2021-07-05: 40 mg via ORAL
  Filled 2021-07-05: qty 2

## 2021-07-05 MED ORDER — AMIODARONE HCL 200 MG PO TABS
200.0000 mg | ORAL_TABLET | Freq: Two times a day (BID) | ORAL | Status: DC
  Administered 2021-07-05 – 2021-07-09 (×10): 200 mg via ORAL
  Filled 2021-07-05 (×10): qty 1

## 2021-07-05 NOTE — TOC Progression Note (Signed)
Transition of Care [facility]) - Progression Note    Patient Details  Name: Debra Saunders MRN: [id number] Date of Birth: January 03, 1946  [facility] CM/SW Contact  Reece Agar, [place] Phone Number: 07/05/2021, 2:50 PM  Clinical Narrative:    CSW spoke with admissions at [facility] about pt DC over the weekend. Admissions stated that pt auth would not be approved (via insurance) because levels were elevated and pt does not look medically stable. Admissions will restart auth.on Monday.  Expected Discharge Plan: Skilled Nursing Facility Barriers to Discharge: Continued Medical Work up  Expected Discharge Plan and Services Expected Discharge Plan: [place] In-house Referral: Clinical Social Work Discharge Planning Services: CM Consult Post Acute Care Choice: [place] arrangements for the past 2 months: Single Family Home                                       Social Determinants of Health (SDOH) Interventions Food Insecurity Interventions: Intervention Not Indicated Financial Strain Interventions: Intervention Not Indicated Housing Interventions: Intervention Not Indicated Transportation Interventions: [employer], Other (Comment) (family and friends also help provide transportation)  Readmission Risk Interventions No flowsheet data found.

## 2021-07-05 NOTE — Anesthesia Postprocedure Evaluation (Signed)
Anesthesia Post Note  Patient: Debra Saunders  Procedure(s) Performed: TRANSESOPHAGEAL ECHOCARDIOGRAM (TEE) CARDIOVERSION     Patient location during evaluation: Endoscopy Anesthesia Type: General Level of consciousness: awake and alert Pain management: pain level controlled Vital Signs Assessment: post-procedure vital signs reviewed and stable Respiratory status: spontaneous breathing, nonlabored ventilation, respiratory function stable and patient connected to nasal cannula oxygen Cardiovascular status: stable and blood pressure returned to baseline Postop Assessment: no apparent nausea or vomiting Anesthetic complications: no   No notable events documented.  Last Vitals:  Vitals:   07/05/21 0331 07/05/21 0725  BP: (!) 129/46 137/69  Pulse: 62 60  Resp: 16 19  Temp: 37.1 C 37.1 C  SpO2: 96% 92%    Last Pain:  Vitals:   07/05/21 0725  TempSrc: Oral  PainSc:    Pain Goal: Patients Stated Pain Goal: 0 (07/02/21 1917)                 Catalina Gravel

## 2021-07-05 NOTE — Progress Notes (Addendum)
Advanced Heart Failure Rounding Note   Subjective:    Bld cultures 1/5---> No growth.   1/9- EP consulted. Not a candidate for extraction d/t comorbid conditions.  1/11 PICC placed.  1/12 S/P TEE DC/CV with restoration of SR. Diuretics held   Creatinine 1.3>1.4>1.4  Complaining of fatigue and some shortness of breath.   Objective:     Vital Signs:   Temp:  [98.2 F (36.8 C)-99.5 F (37.5 C)] 98.7 F (37.1 C) (01/13 0725) Pulse Rate:  [60-70] 60 (01/13 0725) Resp:  [16-26] 19 (01/13 0725) BP: (110-137)/(30-111) 137/69 (01/13 0725) SpO2:  [90 %-98 %] 92 % (01/13 0725) Weight:  [90.9 kg-91.1 kg] 91.1 kg (01/13 0631) Last BM Date: 07/04/21  Weight change: Filed Weights   07/04/21 0500 07/04/21 1301 07/05/21 0631  Weight: 90.9 kg 90.9 kg 91.1 kg    Intake/Output:   Intake/Output Summary (Last 24 hours) at 07/05/2021 0931 Last data filed at 07/05/2021 0332 Gross per 24 hour  Intake 833.17 ml  Output 950 ml  Net -116.83 ml     Physical Exam: General:   No resp difficulty HEENT: normal Neck: supple. JVP 8-9 . Carotids 2+ bilat; no bruits. No lymphadenopathy or thryomegaly appreciated. Cor: PMI nondisplaced. Regular rate & rhythm. No rubs, gallops or murmurs. Lungs: clear On 4 liters [place] Abdomen: soft, nontender, nondistended. No hepatosplenomegaly. No bruits or masses. Good bowel sounds. Extremities: no cyanosis, clubbing, rash, edema. RUE PICC Neuro: alert & orientedx3, cranial nerves grossly intact. moves all 4 extremities w/o difficulty. Affect pleasant   Telemetry: A-V paced 60s  Labs: Basic Metabolic Panel: Recent Labs  Lab 07/01/21 0011 07/02/21 0142 07/03/21 0051 07/04/21 0531 07/05/21 0104  NA 137 133* 131* 131* 131*  K 3.4* 4.0 4.2 4.4 3.9  CL 95* 92* 92* 89* 95*  CO2 35* 35* 32 31 31  GLUCOSE 176* 187* 191* 123* 126*  BUN 17 13 14 16 15   CREATININE 0.86 1.02* 1.27* 1.45* 1.40*  CALCIUM 8.0* 7.8* 7.9* 7.9* 7.3*  MG 1.8 1.6* 2.1  --   --      Liver Function Tests: Recent Labs  Lab 06/29/21 1254 06/29/21 1608 06/30/21 0522  AST 14* 13* 11*  ALT 11 12 9   ALKPHOS 126 122 99  BILITOT 1.1 0.9 0.9  PROT 6.4* 6.8 5.8*  ALBUMIN 2.3* 2.3* 2.0*   No results for input(s): LIPASE, AMYLASE in the last 168 hours.  No results for input(s): AMMONIA in the last 168 hours.  CBC: Recent Labs  Lab 06/29/21 0507 06/29/21 1254 06/30/21 0522 07/01/21 0011 07/02/21 0142 07/03/21 0051 07/04/21 0531  WBC 15.8*   < > 20.6* 19.7* 18.3* 19.6* 19.0*  NEUTROABS 11.9*  --   --   --   --   --   --   HGB 8.0*   < > 8.0* 8.6* 8.0* 8.0* 8.2*  HCT 24.4*   < > 24.5* 27.8* 24.9* 26.3* 26.1*  MCV 83.6   < > 85.1 86.3 84.7 86.8 87.9  PLT 135*   < > 166 208 203 196 176   < > = values in this interval not displayed.    Cardiac Enzymes: No results for input(s): CKTOTAL, CKMB, CKMBINDEX, TROPONINI in the last 168 hours.  BNP: BNP (last 3 results) Recent Labs    08/24/20 1506 03/27/21 1530 06/25/21 1350  BNP 51.2 31.1 344.7*    ProBNP (last 3 results) No results for input(s): PROBNP in the last 8760 hours.  Other results:  Imaging: DG Chest Port 1 View  Result Date: 07/03/2021 CLINICAL DATA:  RIGHT PICC line inserted EXAM: PORTABLE CHEST 1 VIEW COMPARISON:  07/12/2020 FINDINGS: Interval placement RIGHT PICC line with tip at the RIGHT cavoatrial junction. LEFT-sided pacer noted. Stable cardiac silhouette. Mild pulmonary edema pattern. IMPRESSION: 1. RIGHT PICC line insertion with tip at the cavoatrial junction. 2. Mild pulmonary edema pattern. Electronically Signed   By: Suzy Bouchard M.D.   On: 07/03/2021 11:54   ECHO TEE  Result Date: 07/04/2021    TRANSESOPHOGEAL ECHO REPORT   Patient Name:   Debra Saunders Date of Exam: 07/04/2021 Medical Rec #:  [id number]        Height:       60.0 in Accession #:    [id number]       Weight:       200.4 lb Date of Birth:  May 23, 1946         BSA:          1.868 m Patient Age:    76 years          BP:           108/43 mmHg Patient Gender: F                HR:           59 bpm. Exam Location:  Inpatient Procedure: Transesophageal Echo, Color Doppler and Cardiac Doppler Indications:     I48.91* Unspecified atrial fibrillation  History:         Patient has prior history of Echocardiogram examinations, most                  recent 06/28/2021. CHF, Pulmonary HTN, Arrythmias:Atrial                  Fibrillation; Risk Factors:Hypertension, Diabetes, Dyslipidemia                  and Sleep Apnea.  Sonographer:     [name] Sarna Senior RDCS Referring Phys:  [place] Diagnosing Phys: Franki Monte PROCEDURE: After discussion of the risks and benefits of a TEE, an informed consent was obtained from the patient. The transesophogeal probe was passed without difficulty through the esophogus of the patient. Sedation performed by different physician. The patient was monitored while under deep sedation. Anesthestetic sedation was provided intravenously by Anesthesiology: 70mg  of Propofol. The patient developed no complications during the procedure. A successful direct current cardioversion was performed at 200 joules with 1 attempt. IMPRESSIONS  1. Left ventricular ejection fraction, by estimation, is 60 to 65%. The left ventricle has normal function. There is mild left ventricular hypertrophy.  2. Peak RV-RA gradient 30 mmHg. 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 Right ventricular systolic function is normal. The right ventricular size is mildly enlarged.  3. Left atrial size was mildly dilated. No left atrial/left atrial appendage thrombus was detected.  4. Right atrial size was moderately dilated.  5. The mitral valve is normal in structure. Mild to moderate mitral valve regurgitation. No evidence of mitral stenosis.  6. There is mobile vegetation on the tricuspid valve. The tricuspid valve is abnormal. Tricuspid valve regurgitation is moderate.  7. The aortic valve is  tricuspid. Aortic valve regurgitation is not visualized. Aortic valve sclerosis/calcification is present, without any evidence of aortic stenosis.  8. Grade 3 plaque in the descending thoracic aorta.  9. No PFO  or ASD by color doppler. FINDINGS  Left Ventricle: Left ventricular ejection fraction, by estimation, is 60 to 65%. The left ventricle has normal function. The left ventricular internal cavity size was normal in size. There is mild left ventricular hypertrophy. Right Ventricle: Peak RV-RA gradient 30 mmHg. 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 The right ventricular size is mildly enlarged. No increase in right ventricular wall thickness. Right ventricular systolic function  is normal. Left Atrium: Left atrial size was mildly dilated. No left atrial/left atrial appendage thrombus was detected. Right Atrium: Right atrial size was moderately dilated. Pericardium: There is no evidence of pericardial effusion. Mitral Valve: The mitral valve is normal in structure. Mild to moderate mitral valve regurgitation. No evidence of mitral valve stenosis. Tricuspid Valve: There is mobile vegetation on the tricuspid valve. The tricuspid valve is abnormal. Tricuspid valve regurgitation is moderate. Aortic Valve: The aortic valve is tricuspid. Aortic valve regurgitation is not visualized. Aortic valve sclerosis/calcification is present, without any evidence of aortic stenosis. Pulmonic Valve: The pulmonic valve was normal in structure. Pulmonic valve regurgitation is not visualized. Aorta: Grade 3 plaque in the descending thoracic aorta. The aortic root is normal in size and structure. IAS/Shunts: No PFO or ASD by color doppler. Additional Comments: A device lead is visualized in the right ventricle. Jatasia Gundrum [employer] Electronically signed by Franki Monte Signature Date/Time: 07/04/2021/3:51:43 PM    Final      Medications:     Scheduled Medications:  apixaban  5 mg Oral BID    benzonatate  200 mg Oral TID   capsaicin   Topical BID   Chlorhexidine Gluconate Cloth  6 each Topical Daily   cyclopentolate  1 drop Both Eyes QHS   Difluprednate  1 drop Both Eyes BID   digoxin  0.125 mg Oral Daily   DULoxetine  60 mg Oral Daily   gabapentin  600 mg Oral TID   insulin aspart  0-9 Units Subcutaneous TID WC   macitentan  10 mg Oral Daily   pantoprazole (PROTONIX) IV  40 mg Intravenous Q24H   polyethylene glycol  17 g Oral BID   rosuvastatin  10 mg Oral Daily   Selexipag  1,600 mcg Oral BID   sildenafil  20 mg Oral TID   sodium chloride flush  10-40 mL Intracatheter Q12H   sodium chloride flush  3 mL Intravenous Q12H    Infusions:  sodium chloride     amiodarone 30 mg/hr (07/05/21 0643)   penicillin g continuous IV infusion 8 Million Units (07/05/21 0426)    PRN Medications: sodium chloride, acetaminophen **OR** acetaminophen, albuterol, cholestyramine light, diphenhydrAMINE, EPINEPHrine, guaiFENesin-codeine, hydrALAZINE, HYDROcodone-acetaminophen, morphine injection, sodium chloride flush   Assessment/Plan:   1. 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 - shock resolved - ID following  - Bld Cx 1/5 - No growth  - ID has seen. Continue ceftriaxone. Will need IV abx X 6 weeks followed by suppressive antibiotics.  - PICC placed 07/03/21 - EP did not recommend extraction due to co-morbid conditions.    2. Acute on chronichypoxic respiratory failure with CAP - has underlying ILD. On home O2 - ? Septic embolization to lung -  ID following  - Continue ceftriaxone - On 4 liters [place] which is her home regimen. O2 sats stable.    3. Pulmonary hypertension:  She was followed in the past at [facility].  Based on 4/19 cath, she had severe PAH with PVR 7.2 WU. Chronic hypoxemic respiratory failure, on home oxygen.  She has a  history of remote DVT (2004) but V/Q scan negative for evidence of chronic PE.  High resolution CT chest in 10/19 showed pulmonary fibrosis, and PFTs in 2017 showed severe restriction.  Serological workup for rheumatological diseases was unremarkable.  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 Amiodarone was stopped given lung parenchymal disease. 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  - PH meds resumed sildenafil 20 tid, macitentan 10 daily - Decision made to resume selexipeg at 800 bid (half PTA dose) given that she was off > 3 days. Now back up to 1,600 mcg BID - Continue digoxin for RV support - Continue CPAP and home oxygen.    4. PAF with tachy-brady syndrome - amio stopped in 2019 due to underlying lung disease.  - Failed rate control with oral cardizem. Cardizem discontinued. Amio gtt added with improved rate control.  - On apixaban PTA. Stopped due to concern over GIB on admit.  - Back on Eliquis 5 mg BID - S/P TEE and successful DC-CV.  - Stop amio drip and start amio 200 mg twice a day.    5. AKI due to ATN/shock - resolved - baseline Cr 1.03 - peak Scr  3.21 improved to 0.80, now creeping back up slowly > 1.0 > 1.27>1.4 >1.4  - BMET daily    6. 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 - Volume status trending up. Restart torsemide 40 mg daily. -  Continue spiro 25 mg daily - consider SGLT2i prior to d/c - Renal function trending up.   7. HTN - SBP intermittently soft but better this am, diastolic pressure on lower end. Monitor.   8. 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 DM2 - on insulin. - cover with SSI    10. OSA on CPAP - continue CPAP  11. Hypokalemia/hypomagnesemia: - K stable.    12. Anemia  -- check CBC  -No  obvious source.  -FOBT negative   Will need SNF.  [employer] contacted by [facility] for CPAP - home CPAP does not work Coordinate IV abx with Occupational psychologist.   Length of Stay: Jashaun Penrose  NP-C  07/05/2021, 9:31 AM  Advanced Heart Failure Team Pager [phone] (M-F; [place])  Please contact [facility] for night-coverage after hours (4p -7a ) and weekends on [email or web address]   Patient seen with NP, agree with the above note.    Successful TEE-guided DCCV yesterday.  She appears to be a-paced today. Stable creatinine 1.4.  On TEE, she still has vegetation on pacemaker wire and on tricuspid valve with moderate TR.  LV EF 60-65%, normal RV systolic function with mild dilation.    Denies dyspnea, reports fatigue.    General: NAD Neck: JVP 9-10 cm, no thyromegaly or thyroid nodule.  Lungs: Clear to auscultation bilaterally with normal respiratory effort. CV: Nondisplaced PMI.  Heart regular S1/S2, no S3/S4, no murmur.  Left leg lymphedemia.  Abdomen: Soft, nontender, no hepatosplenomegaly, no distention.  Skin: Intact without lesions or rashes.  Neurologic: Alert and oriented x 3.  Psych: Normal affect. Extremities: No clubbing or cyanosis.  HEENT: Normal.   Mild volume overload on exam today, would restart home torsemide 40 mg bid and replace K.    Continue her PH regimen.    She is out of AF today (looks a-paced).   - Will get ECG.  - Can convert amiodarone to po.    Continue PCN G for endocarditis/pacer wire vegetation, group G strep.  Treat IV to 08/08/21, then will need po suppression.    Hgb has been low but stable, no evidence for active GI bleeding (FOBT negative).  - Need CBC today.    She will need SNF placement.  She is agreeable to this.   [name] 07/05/2021 10:38 AM

## 2021-07-05 NOTE — Progress Notes (Signed)
Mobility Specialist: Progress Note   07/05/21 1701  Mobility  Activity Contraindicated/medical hold   Per RN pt is feeling lethargic and is awaiting blood transfusion Currently has a low grade fever. Instructed to hold mobility for now.   [facility] Julianny Milstein Mobility Specialist Mobility Specialist 4 [place]: [phone] Mobility Specialist 2 [place] and [place]: [phone]

## 2021-07-05 NOTE — Progress Notes (Signed)
PT Cancellation Note  Patient Details Name: Debra Saunders MRN: [id number] DOB: 1945/11/17   Cancelled Treatment:    Reason Eval/Treat Not Completed: C/o fatigue/lethargy, dizziness and feeling feverish limiting pt's ability to participate; per RN, plan to receive blood transfusion. Pt's daughters present and supportive; pt and family in agreement for SNF-level therapies. Will update d/c recommendations accordingly next treatment session.  Mabeline Caras, PT, DPT Acute Rehabilitation Services  Pager [phone] Office [place] 07/05/2021, 2:56 PM

## 2021-07-05 NOTE — Progress Notes (Signed)
Progress Note   Patient: Debra Saunders OYD:[id number] DOB: Dec 04, 1945 DOA: 06/29/2021     6 DOS: the patient was seen and examined on 07/05/2021   Brief hospital course: 76 year old female with hypertension, hyperlipidemia, diabetes, SDH 2014, SA node dysfunction status post pacemaker, OSA, PAH, interstitial lung disease on chronic 4 L O2, diastolic CHF, A. fib on Eliquis, CKD stage III AAA, cardiorenal syndrome, DVT, initially presented to [facility] ED on 06/25/2021 with shortness of breath, cough, fevers, chills dysuria and right upper quadrant pain.  Patient was admitted with severe sepsis, septic shock secondary to multifocal pneumonia, septic emboli and strep bacteremia, Klebsiella UTI.  Initially required Levophed, stress dose steroids for short time.  TEE 1/6 consistent with infected pacemaker lead with vegetation.  Patient also required 1 unit of packed RBCs for hemoglobin of 7, no plans for endoscopy. Acute on chronic CKD has improved.  LFTs has improved (shock liver) Patient was transferred to [facility] on 1/7 for pacer extraction, EP cardiology and cardiology were consulted, not considered a candidate for transvenous lead extraction due to significant comorbidities.  Patient also developed A. fib with RVR and was started on amiodarone. ID is following, will need prolonged course of antibiotic treatment. Underwent TEE/DCCV on 07/04/2021.    Assessment and Plan * Severe sepsis with septic shock secondary to infected pacemaker with streptoccos bacteremia - (present on admission) - Blood cultures 1/03 + Streptococcus group G -TEE 1/6 large mobile vegetation on the RV pacing wire noted in the right atrium with estimated 1x 1.5 cm in size or larger -Blood cultures 1/5 no growth -Septic shock physiology resolved, received IV Levophed and fluids. -Cardiology, ID following. -Per EP cardiology, not a candidate for transvenous lead extraction because of significant comorbid conditions. -TEE today  showed vegetation still present on pacemaker lead in RA and tricuspid valve.  Patient passed amoxicillin challenge -Per ID start penicillin G to complete 6 weeks of antibiotics, last day of treatment on 08/08/2021 followed by suppressive therapy with amoxicillin.  Outpatient follow-up with ID.   Atrial fibrillation with tachy brady syndrome - (present on admission) -Patient was started on IV amiodarone on 1/8 due to atrial fibrillation with RVR.  Failed rate control with oral Cardizem. -Was on amiodarone but was stopped in 2019 due to lung disease.  Continue digoxin -Continue Eliquis 5 mg twice daily. -Underwent successful DCCV.  TEE showed vegetation still present on pacemaker lead in RA and tricuspid valve  Acute blood loss anemia- (present on admission) Baseline hgb: 9-10.  History of anemia of chronic disease  -Presented with hgb 8>7 then transfused 1 unit PRBC -GI was consulted, deferred endoscopy  -Hemoglobin trended down to 6.8, will transfuse 2 units packed RBCs.  - FOBT 1/12 negative.  DVT (deep venous thrombosis) ([place])- (present on admission) -History of PE and DVT  -Initially Eliquis was placed on hold due to ABLA, now resumed -IVC filter was placed in 2015 at the time of pacemaker placement -Continue eliquis  Acute renal failure superimposed on stage 3a chronic kidney disease ([place])- (present on admission) -Creatinine peaked at 3.21, -Creatinine stable at 1.4 and slightly trending down today  Chronic diastolic CHF (congestive heart failure) ([place])- (present on admission) -2D echo 06/27/21: EF of 60-65% normal LVF. Diastolic parameters indeterminate -TEE showed EF of 55 to 60%, RV normal, mild to moderate MR -Creatinine stable at 1.4, slightly trending down, was 1.45 on 1/12, monitor on diuretics -Cardiology following  Acute on chronic respiratory failure with hypoxia ([place])- (present on  admission) -Chronic respiratory failure secondary to ILD, pulmonary hypertension on 4L  oxygen, originally required BiPAP, now back to baseline -Has underlying OSA on CPAP nightly -Complaining of cough after the PE, placed on Tessalon Perles and Robitussin  Hyperlipidemia- (present on admission) - LFTs now normal, continue Crestor  Pulmonary hypertension, unspecified ([place])- (present on admission) - Cardiology following, resumed sildenafil, macitentan, selexipag - Continue digoxin, on Eliquis, CPAP and home O2  Hypertension- (present on admission) - BP stable.  Pressure injury of skin- (present on admission) Pressure Injury 06/29/21 Right Stage 1 -  Intact skin with non-blanchable redness of a localized area usually over a bony prominence. abrasion on right gluteal fold (Active)  06/29/21 1500  Location:   Location Orientation: Right  Staging: Stage 1 -  Intact skin with non-blanchable redness of a localized area usually over a bony prominence.  Wound Description (Comments): abrasion on right gluteal fold  Present on Admission: Yes       Diabetes ([place]) - Hemoglobin A1c of 5.4 on 06/25/2021 -Well-controlled, CBGs likely elevated in the setting of severe sepsis and steroids, continue sliding scale insulin -Place on SGLT2 inhibitor prior to discharge per cardiology  Recent Labs    07/03/21 0607 07/03/21 1145 07/03/21 1655 07/03/21 2113 07/04/21 0602 07/04/21 1106  GLUCAP 165* 158* 165* 177* 140* 130*    klebsiella pneumoniae UTI- (present on admission) - Currently no urinary symptoms, on IV Rocephin for infective endocarditis  Obesity (BMI 30-39.9)- (present on admission) BMI 39.7.  Healthy lifestyle recommended.  OSA (obstructive sleep apnea)- (present on admission) - Continue CPAP nightly   Subjective: Feeling tired, somewhat hoarse, coughing today after TEE.  No fevers or chills.  Objective BP (!) 149/55    Pulse 66    Temp 98.7 F (37.1 C) (Oral)    Resp 20    Ht 5' (1.524 m)    Wt 91.1 kg    SpO2 92%    BMI 39.22 kg/m    Physical  Exam General: Alert and oriented x 3, NAD, coughing Cardiovascular: S1 S2 clear, RRR. No pedal edema b/l Respiratory: CTAB, no wheezing Gastrointestinal: Soft, nontender, nondistended, NBS Ext: no pedal edema bilaterally Psych: Normal affect and demeanor, alert and oriented x3    Data Reviewed: CBC Latest Ref Rng & Units 07/05/2021 07/04/2021 07/03/2021  WBC 4.0 - 10.5 K/uL 12.2(H) 19.0(H) 19.6(H)  Hemoglobin 12.0 - 15.0 g/dL 6.8(LL) 8.2(L) 8.0(L)  Hematocrit 36.0 - 46.0 % 22.3(L) 26.1(L) 26.3(L)  Platelets 150 - 400 K/uL 151 176 196    BMP Latest Ref Rng & Units 07/05/2021 07/04/2021 07/03/2021  Glucose 70 - 99 mg/dL 126(H) 123(H) 191(H)  BUN 8 - 23 mg/dL 15 16 14   Creatinine 0.44 - 1.00 mg/dL 1.40(H) 1.45(H) 1.27(H)  BUN/Creat Ratio 12 - 28 - - -  Sodium 135 - 145 mmol/L 131(L) 131(L) 131(L)  Potassium 3.5 - 5.1 mmol/L 3.9 4.4 4.2  Chloride 98 - 111 mmol/L 95(L) 89(L) 92(L)  CO2 22 - 32 mmol/L 31 31 32  Calcium 8.9 - 10.3 mg/dL 7.3(L) 7.9(L) 7.9(L)     Family Communication:   Disposition: Status is: Inpatient  Remains inpatient appropriate because: Awaiting skilled nursing facility, anemia, hemoglobin 6.8, needs to be     Time spent: 35 minutes  Author: Estill Cotta, MD 07/05/2021 2:04 PM  For on call review [email or web address].

## 2021-07-05 NOTE — Plan of Care (Signed)
  Problem: Education: Goal: Knowledge of General Education information will improve Description: Including pain rating scale, medication(s)/side effects and non-pharmacologic comfort measures Outcome: Progressing   Problem: Health Behavior/Discharge Planning: Goal: Ability to manage health-related needs will improve Outcome: Progressing   Problem: Clinical Measurements: Goal: Ability to maintain clinical measurements within normal limits will improve Outcome: Progressing Goal: Will remain free from infection Outcome: Progressing Goal: Diagnostic test results will improve Outcome: Progressing Goal: Respiratory complications will improve Outcome: Progressing Goal: Cardiovascular complication will be avoided Outcome: Progressing   Problem: Activity: Goal: Risk for activity intolerance will decrease Outcome: Progressing   Problem: Nutrition: Goal: Adequate nutrition will be maintained Outcome: Progressing   Problem: Coping: Goal: Level of anxiety will decrease Outcome: Progressing   Problem: Elimination: Goal: Will not experience complications related to bowel motility Outcome: Progressing Goal: Will not experience complications related to urinary retention Outcome: Progressing   Problem: Pain Managment: Goal: General experience of comfort will improve Outcome: Progressing   Problem: Safety: Goal: Ability to remain free from injury will improve Outcome: Progressing   Problem: Skin Integrity: Goal: Risk for impaired skin integrity will decrease Outcome: Progressing   Problem: Education: Goal: Knowledge of disease or condition will improve Outcome: Progressing Goal: Understanding of medication regimen will improve Outcome: Progressing Goal: Individualized Educational Video(s) Outcome: Progressing   Problem: Activity: Goal: Ability to tolerate increased activity will improve Outcome: Progressing   Problem: Cardiac: Goal: Ability to achieve and maintain  adequate cardiopulmonary perfusion will improve Outcome: Progressing   Problem: Health Behavior/Discharge Planning: Goal: Ability to safely manage health-related needs after discharge will improve Outcome: Progressing   

## 2021-07-05 NOTE — Care Management Important Message (Signed)
Important Message  Patient Details  Name: Debra Saunders MRN: [id number] Date of Birth: 23-Nov-1945   Medicare Important Message Given:  Yes     [name] 07/05/2021, 1:32 PM

## 2021-07-06 DIAGNOSIS — R109 Unspecified abdominal pain: Secondary | ICD-10-CM | POA: Diagnosis present

## 2021-07-06 DIAGNOSIS — I5031 Acute diastolic (congestive) heart failure: Secondary | ICD-10-CM

## 2021-07-06 DIAGNOSIS — R1010 Upper abdominal pain, unspecified: Secondary | ICD-10-CM

## 2021-07-06 HISTORY — DX: Unspecified abdominal pain: R10.9

## 2021-07-06 LAB — CBC
HCT: 27.1 % — ABNORMAL LOW (ref 36.0–46.0)
Hemoglobin: 8.7 g/dL — ABNORMAL LOW (ref 12.0–15.0)
MCH: 27.8 pg (ref 26.0–34.0)
MCHC: 32.1 g/dL (ref 30.0–36.0)
MCV: 86.6 fL (ref 80.0–100.0)
Platelets: 147 10*3/uL — ABNORMAL LOW (ref 150–400)
RBC: 3.13 MIL/uL — ABNORMAL LOW (ref 3.87–5.11)
RDW: 14.5 % (ref 11.5–15.5)
WBC: 14 10*3/uL — ABNORMAL HIGH (ref 4.0–10.5)
nRBC: 0 % (ref 0.0–0.2)

## 2021-07-06 LAB — GLUCOSE, CAPILLARY
Glucose-Capillary: 127 mg/dL — ABNORMAL HIGH (ref 70–99)
Glucose-Capillary: 181 mg/dL — ABNORMAL HIGH (ref 70–99)
Glucose-Capillary: 154 mg/dL — ABNORMAL HIGH (ref 70–99)
Glucose-Capillary: 115 mg/dL — ABNORMAL HIGH (ref 70–99)

## 2021-07-06 LAB — BASIC METABOLIC PANEL
Anion gap: 11 (ref 5–15)
Anion gap: 9 (ref 5–15)
BUN: 12 mg/dL (ref 8–23)
BUN: 13 mg/dL (ref 8–23)
CO2: 28 mmol/L (ref 22–32)
CO2: 30 mmol/L (ref 22–32)
Calcium: 7.2 mg/dL — ABNORMAL LOW (ref 8.9–10.3)
Calcium: 7.9 mg/dL — ABNORMAL LOW (ref 8.9–10.3)
Chloride: 87 mmol/L — ABNORMAL LOW (ref 98–111)
Chloride: 94 mmol/L — ABNORMAL LOW (ref 98–111)
Creatinine, Ser: 1.23 mg/dL — ABNORMAL HIGH (ref 0.44–1.00)
Creatinine, Ser: 1.24 mg/dL — ABNORMAL HIGH (ref 0.44–1.00)
GFR, Estimated: 45 mL/min — ABNORMAL LOW (ref >60)
GFR, Estimated: 45 mL/min — ABNORMAL LOW (ref >60)
Glucose, Bld: 377 mg/dL — ABNORMAL HIGH (ref 70–99)
Glucose, Bld: 175 mg/dL — ABNORMAL HIGH (ref 70–99)
Potassium: 5.3 mmol/L — ABNORMAL HIGH (ref 3.5–5.1)
Potassium: 3.8 mmol/L (ref 3.5–5.1)
Sodium: 126 mmol/L — ABNORMAL LOW (ref 135–145)
Sodium: 133 mmol/L — ABNORMAL LOW (ref 135–145)

## 2021-07-06 NOTE — Progress Notes (Signed)
Progress Note   Patient: Debra Saunders DOB: 1946-03-23 DOA: 06/29/2021     7 DOS: the patient was seen and examined on 07/06/2021   Brief hospital course: 76 year old female with hypertension, hyperlipidemia, diabetes, SDH 2014, SA node dysfunction status post pacemaker, OSA, PAH, interstitial lung disease on chronic 4 L O2, diastolic CHF, A. fib on Eliquis, CKD stage III AAA, cardiorenal syndrome, DVT, initially presented to [facility] ED on 06/25/2021 with shortness of breath, cough, fevers, chills dysuria and right upper quadrant pain.  Patient was admitted with severe sepsis, septic shock secondary to multifocal pneumonia, septic emboli and strep bacteremia, Klebsiella UTI.  Initially required Levophed, stress dose steroids for short time.  TEE 1/6 consistent with infected pacemaker lead with vegetation.  Patient also required 1 unit of packed RBCs for hemoglobin of 7, no plans for endoscopy. Acute on chronic CKD has improved.  LFTs has improved (shock liver) Patient was transferred to [facility] on 1/7 for pacer extraction, EP cardiology and cardiology were consulted, not considered a candidate for transvenous lead extraction due to significant comorbidities.  Patient also developed A. fib with RVR and was started on amiodarone. ID is following, will need prolonged course of antibiotic treatment. Underwent TEE/DCCV on 07/04/2021.    Assessment and Plan * Severe sepsis with septic shock secondary to infected pacemaker with streptoccos bacteremia - (present on admission) - Blood cultures 1/03 + Streptococcus group G -TEE 1/6 large mobile vegetation on the RV pacing wire noted in the right atrium with estimated 1x 1.5 cm in size or larger -Blood cultures 1/5 no growth -Septic shock physiology resolved, received IV Levophed and fluids. -Cardiology, ID following. -Per EP cardiology, not a candidate for transvenous lead extraction because of significant comorbid conditions. -TEE 1/12  showed vegetation still present on pacemaker lead in RA and tricuspid valve.   -Patient passed amoxicillin challenge. Per ID start penicillin G to complete 6 weeks of antibiotics, last day of treatment on 08/08/2021 followed by suppressive therapy with amoxicillin.  Outpatient follow-up with ID.   Atrial fibrillation with tachy brady syndrome - (present on admission) -Patient was started on IV amiodarone on 1/8 due to atrial fibrillation with RVR.  Failed rate control with oral Cardizem. -Was on amiodarone but was stopped in 2019 due to lung disease.  Continue digoxin -Continue Eliquis 5 mg twice daily. -Underwent successful DCCV.  TEE showed vegetation still present on pacemaker lead in RA and tricuspid valve  Acute blood loss anemia- (present on admission) Baseline hgb: 9-10.  History of anemia of chronic disease  --GI was consulted, deferred endoscopy  -Received 2 units packed RBCs on 1/13 for hemoglobin 6.8   - FOBT 1/12 negative. -Follow H&H.  DVT (deep venous thrombosis) ([place])- (present on admission) -History of PE and DVT  -Initially Eliquis was placed on hold due to ABLA, now resumed -IVC filter was placed in 2015 at the time of pacemaker placement -Continue eliquis, follow H&H  Acute renal failure superimposed on stage 3a chronic kidney disease ([place])- (present on admission) -Creatinine peaked at 3.21, -AKI resolved, creatinine 1.2  Chronic diastolic CHF (congestive heart failure) ([place])- (present on admission) -2D echo 06/27/21: EF of 60-65% normal LVF. Diastolic parameters indeterminate -TEE showed EF of 55 to 60%, RV normal, mild to moderate MR -Creatinine now stable, continue torsemide -Sodium stable on repeat labs  Acute on chronic respiratory failure with hypoxia (HCC)- (present on admission) -Chronic respiratory failure secondary to ILD, pulmonary hypertension on 4L oxygen, originally required BiPAP,  now back to baseline -Continue CPAP qhs for OSA   Hyperlipidemia-  (present on admission) - Continue Crestor, LFTs normal  Pulmonary hypertension, unspecified ([place])- (present on admission) - Cardiology following, resumed sildenafil, macitentan, selexipag - Continue digoxin, on Eliquis, CPAP and home O2  Hypertension- (present on admission) - BP stable.  Pressure injury of skin- (present on admission) Pressure Injury 06/29/21 Right Stage 1 -  Intact skin with non-blanchable redness of a localized area usually over a bony prominence. abrasion on right gluteal fold (Active)  06/29/21 1500  Location:   Location Orientation: Right  Staging: Stage 1 -  Intact skin with non-blanchable redness of a localized area usually over a bony prominence.  Wound Description (Comments): abrasion on right gluteal fold  Present on Admission: Yes       Diabetes ([place]) -Hemoglobin A1c of 5.4 on 06/25/2021 -Well-controlled, CBGs likely elevated in the setting of severe sepsis and steroids, continue sliding scale insulin -Place on SGLT2 inhibitor prior to discharge per cardiology    klebsiella pneumoniae UTI- (present on admission) - Currently no urinary symptoms, on IV Rocephin for infective endocarditis  Obesity (BMI 30-39.9)- (present on admission) BMI 39.7.  Healthy lifestyle recommended.  OSA (obstructive sleep apnea)- (present on admission) - Continue CPAP nightly   Subjective: States mild upper abdominal pain, no nausea vomiting, diarrhea or fevers.  Objective Vital signs were reviewed and unremarkable. Vitals:   07/06/21 0755 07/06/21 1111  BP: (!) 119/47 (!) 135/48  Pulse: 60 64  Resp: 19 20  Temp: 98.5 F (36.9 C) 99.2 F (37.3 C)  SpO2: 91% 91%     Physical Exam General: Alert and oriented x 3, NAD Cardiovascular: S1 S2 clear, RRR. No pedal edema b/l Respiratory: CTAB, no wheezing, rales or rhonchi Gastrointestinal: Soft, mild right upper and epigastric TTP , nondistended, NBS Ext: no pedal edema bilaterally Neuro: no new  deficits   Data Reviewed:  My review of labs, imaging, notes and other tests shows no new significant findings.   Family Communication: Discussed with patient  Disposition: Status is: Inpatient  Remains inpatient appropriate because: Awaiting skilled nursing facility   Time spent: 25 minutes  Author: Estill Cotta, MD 07/06/2021 12:58 PM  For on call review [email or web address].

## 2021-07-06 NOTE — Progress Notes (Addendum)
Patient ID: Debra Saunders, female   DOB: 04-14-1946, 76 y.o.   MRN: [id number]    Advanced Heart Failure Rounding Note   Subjective:    Bld cultures 1/5---> No growth.   1/9- EP consulted. Not a candidate for extraction d/t comorbid conditions.  1/11 PICC placed.  1/12 S/P TEE DC/CV with restoration of SR.   Creatinine 1.3>1.4>1.4>1.23  Feels better today, no dyspnea at rest.  She got lightheaded when she stood up.   Objective:     Vital Signs:   Temp:  [97.9 F (36.6 C)-99.9 F (37.7 C)] 98.5 F (36.9 C) (01/14 0755) Pulse Rate:  [58-66] 60 (01/14 0755) Resp:  [14-22] 19 (01/14 0755) BP: (113-149)/(46-55) 119/47 (01/14 0755) SpO2:  [91 %-98 %] 91 % (01/14 0755) Weight:  [90.6 kg] 90.6 kg (01/14 0605) Last BM Date: 07/05/21  Weight change: Filed Weights   07/04/21 1301 07/05/21 0631 07/06/21 0605  Weight: 90.9 kg 91.1 kg 90.6 kg    Intake/Output:   Intake/Output Summary (Last 24 hours) at 07/06/2021 1045 Last data filed at 07/06/2021 0343 Gross per 24 hour  Intake 1721.4 ml  Output 1475 ml  Net 246.4 ml     Physical Exam: General: NAD Neck: JVP 8 cm, no thyromegaly or thyroid nodule.  Lungs: Clear to auscultation bilaterally with normal respiratory effort. CV: Nondisplaced PMI.  Heart regular S1/S2, no S3/S4, 2/6 HSM LLSB.  No peripheral edema.   Abdomen: Soft, nontender, no hepatosplenomegaly, no distention.  Skin: Intact without lesions or rashes.  Neurologic: Alert and oriented x 3.  Psych: Normal affect. Extremities: No clubbing or cyanosis.  HEENT: Normal.   Telemetry: A-V paced 60s (personally reviewed)  Labs: Basic Metabolic Panel: Recent Labs  Lab 07/01/21 0011 07/02/21 0142 07/03/21 0051 07/04/21 0531 07/05/21 0104 07/06/21 0500  NA 137 133* 131* 131* 131* 126*  K 3.4* 4.0 4.2 4.4 3.9 5.3*  CL 95* 92* 92* 89* 95* 87*  CO2 35* 35* 32 31 31 28   GLUCOSE 176* 187* 191* 123* 126* 377*  BUN 17 13 14 16 15 12   CREATININE 0.86 1.02* 1.27*  1.45* 1.40* 1.23*  CALCIUM 8.0* 7.8* 7.9* 7.9* 7.3* 7.2*  MG 1.8 1.6* 2.1  --   --   --     Liver Function Tests: Recent Labs  Lab 06/29/21 1254 06/29/21 1608 06/30/21 0522  AST 14* 13* 11*  ALT 11 12 9   ALKPHOS 126 122 99  BILITOT 1.1 0.9 0.9  PROT 6.4* 6.8 5.8*  ALBUMIN 2.3* 2.3* 2.0*   No results for input(s): LIPASE, AMYLASE in the last 168 hours.  No results for input(s): AMMONIA in the last 168 hours.  CBC: Recent Labs  Lab 07/02/21 0142 07/03/21 0051 07/04/21 0531 07/05/21 1114 07/06/21 0500  WBC 18.3* 19.6* 19.0* 12.2* 14.0*  HGB 8.0* 8.0* 8.2* 6.8* 8.7*  HCT 24.9* 26.3* 26.1* 22.3* 27.1*  MCV 84.7 86.8 87.9 86.8 86.6  PLT 203 196 176 151 147*    Cardiac Enzymes: No results for input(s): CKTOTAL, CKMB, CKMBINDEX, TROPONINI in the last 168 hours.  BNP: BNP (last 3 results) Recent Labs    08/24/20 1506 03/27/21 1530 06/25/21 1350  BNP 51.2 31.1 344.7*    ProBNP (last 3 results) No results for input(s): PROBNP in the last 8760 hours.    Other results:  Imaging: ECHO TEE  Result Date: 07/04/2021    TRANSESOPHOGEAL ECHO REPORT   Patient Name:   Debra Saunders Date of Exam: 07/04/2021 Medical  Rec #:  [id number]        Height:       60.0 in Accession #:    [id number]       Weight:       200.4 lb Date of Birth:  Nov 09, 1945         BSA:          1.868 m Patient Age:    66 years         BP:           108/43 mmHg Patient Gender: F                HR:           59 bpm. Exam Location:  Inpatient Procedure: Transesophageal Echo, Color Doppler and Cardiac Doppler Indications:     I48.91* Unspecified atrial fibrillation  History:         Patient has prior history of Echocardiogram examinations, most                  recent 06/28/2021. CHF, Pulmonary HTN, Arrythmias:Atrial                  Fibrillation; Risk Factors:Hypertension, Diabetes, Dyslipidemia                  and Sleep Apnea.  Sonographer:     [name] Sarna Senior RDCS Referring Phys:  [place] Diagnosing  Phys: Franki Monte PROCEDURE: After discussion of the risks and benefits of a TEE, an informed consent was obtained from the patient. The transesophogeal probe was passed without difficulty through the esophogus of the patient. Sedation performed by different physician. The patient was monitored while under deep sedation. Anesthestetic sedation was provided intravenously by Anesthesiology: 70mg  of Propofol. The patient developed no complications during the procedure. A successful direct current cardioversion was performed at 200 joules with 1 attempt. IMPRESSIONS  1. Left ventricular ejection fraction, by estimation, is 60 to 65%. The left ventricle has normal function. There is mild left ventricular hypertrophy.  2. Peak RV-RA gradient 30 mmHg. 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 Right ventricular systolic function is normal. The right ventricular size is mildly enlarged.  3. Left atrial size was mildly dilated. No left atrial/left atrial appendage thrombus was detected.  4. Right atrial size was moderately dilated.  5. The mitral valve is normal in structure. Mild to moderate mitral valve regurgitation. No evidence of mitral stenosis.  6. There is mobile vegetation on the tricuspid valve. The tricuspid valve is abnormal. Tricuspid valve regurgitation is moderate.  7. The aortic valve is tricuspid. Aortic valve regurgitation is not visualized. Aortic valve sclerosis/calcification is present, without any evidence of aortic stenosis.  8. Grade 3 plaque in the descending thoracic aorta.  9. No PFO or ASD by color doppler. FINDINGS  Left Ventricle: Left ventricular ejection fraction, by estimation, is 60 to 65%. The left ventricle has normal function. The left ventricular internal cavity size was normal in size. There is mild left ventricular hypertrophy. Right Ventricle: Peak RV-RA gradient 30 mmHg. 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  The right ventricular size is mildly enlarged. No increase in right ventricular wall thickness. Right ventricular systolic function  is normal. Left Atrium: Left atrial size was mildly dilated. No left atrial/left atrial appendage thrombus was detected. Right Atrium: Right atrial size was moderately dilated. Pericardium:  There is no evidence of pericardial effusion. Mitral Valve: The mitral valve is normal in structure. Mild to moderate mitral valve regurgitation. No evidence of mitral valve stenosis. Tricuspid Valve: There is mobile vegetation on the tricuspid valve. The tricuspid valve is abnormal. Tricuspid valve regurgitation is moderate. Aortic Valve: The aortic valve is tricuspid. Aortic valve regurgitation is not visualized. Aortic valve sclerosis/calcification is present, without any evidence of aortic stenosis. Pulmonic Valve: The pulmonic valve was normal in structure. Pulmonic valve regurgitation is not visualized. Aorta: Grade 3 plaque in the descending thoracic aorta. The aortic root is normal in size and structure. IAS/Shunts: No PFO or ASD by color doppler. Additional Comments: A device lead is visualized in the right ventricle. Analise Glotfelty [employer] Electronically signed by Franki Monte Signature Date/Time: 07/04/2021/3:51:43 PM    Final      Medications:     Scheduled Medications:  sodium chloride   Intravenous Once   amiodarone  200 mg Oral BID   apixaban  5 mg Oral BID   benzonatate  200 mg Oral TID   capsaicin   Topical BID   Chlorhexidine Gluconate Cloth  6 each Topical Daily   cyclopentolate  1 drop Both Eyes QHS   Difluprednate  1 drop Both Eyes BID   digoxin  0.125 mg Oral Daily   DULoxetine  60 mg Oral Daily   gabapentin  600 mg Oral TID   insulin aspart  0-9 Units Subcutaneous TID WC   macitentan  10 mg Oral Daily   pantoprazole (PROTONIX) IV  40 mg Intravenous Q24H   polyethylene glycol  17 g Oral BID   rosuvastatin  10 mg Oral Daily   Selexipag  1,600 mcg Oral BID    sildenafil  20 mg Oral TID   sodium chloride flush  10-40 mL Intracatheter Q12H   sodium chloride flush  3 mL Intravenous Q12H   torsemide  40 mg Oral BID    Infusions:  sodium chloride     penicillin g continuous IV infusion 8 Million Units (07/06/21 0510)    PRN Medications: sodium chloride, acetaminophen **OR** acetaminophen, albuterol, cholestyramine light, diphenhydrAMINE, EPINEPHrine, guaiFENesin-codeine, hydrALAZINE, HYDROcodone-acetaminophen, morphine injection, phenol, sodium chloride flush   Assessment/Plan:   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  Group G strep bacteremia.  Septic emboli to lungs. Seen by EP, thought to be too high risk for device extraction.  - ID following.  - She will need PCN G IV at home x 6 wks.  Has PICC.  2. Acute on chronic hypoxemic respiratory failure: She developed PNA from septic emboli to lungs.  At baseline, on 4L home oxygen with CPAP at night for interstitial lung disease and OSA.   - Antibiotics as above for PNA.  - She is down to 4 L by [place] (baseline).  3. Pulmonary hypertension: Based on 4/19 cath, she had severe PAH with PVR 7.2 WU. Chronic hypoxemic respiratory failure, on home oxygen.  She has a history of remote DVT (2004) but V/Q scan negative for evidence of chronic PE.  High resolution CT chest in 10/19 showed pulmonary fibrosis, and PFTs in 2017 showed severe restriction.  Serological workup for rheumatological diseases was unremarkable.  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 Amiodarone was stopped in past given lung parenchymal disease. 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  - She is  back on sildenafil 20 tid and macitentan 10 daily.  - Selexipag increased back to home dose 1600 bid.   - Continue digoxin for RV support.  - Home oxygen, CPAP.  4. Atrial  fibrillation with tachy-brady syndrome:  Had been off amiodarone since 2019 due to interstitial lung disease/pulmonary fibrosis. Amiodarone restarted, now s/p TEE-guided DCCV this admission, suspect a-paced this morning.  - Continue amiodarone 200 mg po bid for now.  - Continue Eliquis 5 mg bid.   5. AKI: Resolved, creatinine 1.23.  6. 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  Volume status looks ok today.  - Continue home torsemide 40 mg bid.  7. HTN: BP controlled.  Now with orthostatic symptoms.  - Place compression stockings.  8. H/o DVT: Anticoagulated.  9. Left leg lymphedema: Chronic.  10. OSA: CPAP.  11. Hyponatremia: Na lower at 126.  - Repeat BMET to confirm.  - Fluid restrict.   She will need SNF placement.   [name] 07/06/2021 10:45 AM

## 2021-07-06 NOTE — Progress Notes (Signed)
Mobility Specialist Progress Note    07/06/21 1653  Mobility  Activity Ambulated in hall  Level of Assistance Contact guard assist, steadying assist  Assistive Device Four wheel walker  Distance Ambulated (ft) 75 ft  Mobility Ambulated with assistance in hallway  Mobility Response Tolerated fair  Mobility performed by Mobility specialist  Bed Position Chair  $Mobility charge 1 Mobility   Pt received in bed and agreeable. Ambulated on 4LO2. Took x1 seated rest break. Upon return to room had void on [facility] and pericare from NT. When walking to chair c/o dizziness. Left in chair with call bell in reach and RN and NT present.   [facility] Mobility Specialist  M.S. 2C and 6E: [phone] M.S. 4E: [phone]

## 2021-07-07 LAB — TYPE AND SCREEN
ABO/RH(D): B POS
Antibody Screen: NEGATIVE
Unit division: 0
Unit division: 0

## 2021-07-07 LAB — BASIC METABOLIC PANEL
Anion gap: 8 (ref 5–15)
BUN: 12 mg/dL (ref 8–23)
CO2: 30 mmol/L (ref 22–32)
Calcium: 7.8 mg/dL — ABNORMAL LOW (ref 8.9–10.3)
Chloride: 95 mmol/L — ABNORMAL LOW (ref 98–111)
Creatinine, Ser: 1.28 mg/dL — ABNORMAL HIGH (ref 0.44–1.00)
GFR, Estimated: 43 mL/min — ABNORMAL LOW (ref >60)
Glucose, Bld: 123 mg/dL — ABNORMAL HIGH (ref 70–99)
Potassium: 3.7 mmol/L (ref 3.5–5.1)
Sodium: 133 mmol/L — ABNORMAL LOW (ref 135–145)

## 2021-07-07 LAB — CBC
HCT: 26.2 % — ABNORMAL LOW (ref 36.0–46.0)
Hemoglobin: 8.2 g/dL — ABNORMAL LOW (ref 12.0–15.0)
MCH: 27.1 pg (ref 26.0–34.0)
MCHC: 31.3 g/dL (ref 30.0–36.0)
MCV: 86.5 fL (ref 80.0–100.0)
Platelets: 152 10*3/uL (ref 150–400)
RBC: 3.03 MIL/uL — ABNORMAL LOW (ref 3.87–5.11)
RDW: 14.8 % (ref 11.5–15.5)
WBC: 13.4 10*3/uL — ABNORMAL HIGH (ref 4.0–10.5)
nRBC: 0 % (ref 0.0–0.2)

## 2021-07-07 LAB — BPAM RBC
Blood Product Expiration Date: 202301222359
Blood Product Expiration Date: 202301252359
ISSUE DATE / TIME: 202301132052
ISSUE DATE / TIME: 202301140009
Unit Type and Rh: 7300
Unit Type and Rh: 7300

## 2021-07-07 LAB — GLUCOSE, CAPILLARY
Glucose-Capillary: 112 mg/dL — ABNORMAL HIGH (ref 70–99)
Glucose-Capillary: 147 mg/dL — ABNORMAL HIGH (ref 70–99)
Glucose-Capillary: 157 mg/dL — ABNORMAL HIGH (ref 70–99)
Glucose-Capillary: 96 mg/dL (ref 70–99)

## 2021-07-07 NOTE — Progress Notes (Signed)
Mobility Specialist Progress Note    07/07/21 1619  Mobility  Activity Ambulated in hall  Level of Assistance Contact guard assist, steadying assist  Assistive Device Four wheel walker  Distance Ambulated (ft) 75 ft  Mobility Ambulated with assistance in hallway  Mobility Response Tolerated fair  Mobility performed by Mobility specialist  Bed Position Chair  $Mobility charge 1 Mobility   Pt received in bed and agreeable. C/o stomach pain and fatigue. Took x2 seated rest breaks. Ambulated on 4LO2. Returned to chair with call bell in reach and visitors present.   [employer] [facility] Mobility Specialist  M.S. 2C and 6E: [phone] M.S. 4E: [phone]

## 2021-07-07 NOTE — Assessment & Plan Note (Addendum)
-   Unclear etiology, CT abdomen on 06/24/2021 negative for acute abdominal pathology.  Right abd  US showed no acute abnormality, history of cholecystectomy -Improved, tolerating diet

## 2021-07-07 NOTE — Progress Notes (Signed)
Progress Note   Patient: Debra Saunders DOB: 1945/11/02 DOA: 06/29/2021     8 DOS: the patient was seen and examined on 07/07/2021   Brief hospital course: 76 year old female with hypertension, hyperlipidemia, diabetes, SDH 2014, SA node dysfunction status post pacemaker, OSA, PAH, interstitial lung disease on chronic 4 L O2, diastolic CHF, A. fib on Eliquis, CKD stage III AAA, cardiorenal syndrome, DVT, initially presented to [facility] ED on 06/25/2021 with shortness of breath, cough, fevers, chills dysuria and right upper quadrant pain.  Patient was admitted with severe sepsis, septic shock secondary to multifocal pneumonia, septic emboli and strep bacteremia, Klebsiella UTI.  Initially required Levophed, stress dose steroids for short time.  TEE 1/6 consistent with infected pacemaker lead with vegetation.  Patient also required 1 unit of packed RBCs for hemoglobin of 7, no plans for endoscopy. Acute on chronic CKD has improved.  LFTs has improved (shock liver) Patient was transferred to [facility] on 1/7 for pacer extraction, EP cardiology and cardiology were consulted, not considered a candidate for transvenous lead extraction due to significant comorbidities.  Patient also developed A. fib with RVR and was started on amiodarone. ID is following, will need prolonged course of antibiotic treatment. Underwent TEE/DCCV on 07/04/2021.  Now awaiting skilled nursing facility for rehab  Assessment and Plan * Severe sepsis with septic shock secondary to infected pacemaker with streptoccos bacteremia - (present on admission) - Blood cultures 1/03 + Streptococcus group G -TEE 1/6 large mobile vegetation on the RV pacing wire noted in the right atrium with estimated 1x 1.5 cm in size or larger -Blood cultures 1/5 no growth -Septic shock physiology resolved, received IV Levophed and fluids. Cardiology, ID following. -Per EP cardiology, not a candidate for transvenous lead extraction because of  significant comorbid conditions. 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   -Patient passed amoxicillin challenge. Per ID start penicillin G to complete 6 weeks of antibiotics, last day of treatment on 08/08/2021 followed by suppressive therapy with amoxicillin.  Outpatient follow-up with ID.   Atrial fibrillation with tachy brady syndrome - (present on admission) -Patient was started on IV amiodarone on 1/8 due to atrial fibrillation with RVR.  Failed rate control with oral Cardizem. -Was on amiodarone but was stopped in 2019 due to lung disease.  Continue digoxin -Underwent successful DCCV.  TEE showed vegetation still present on pacemaker lead in RA and tricuspid valve -Continue eliquis, no acute issues  Abdominal pain- (present on admission) - Unclear etiology, CT abdomen on 06/24/2021 negative for acute abdominal pathology.  Right abd  US showed no acute abnormality, history of cholecystectomy -Per patient, improving today.If any worsening symptoms of anemia, nausea or vomiting, fevers, pain, low threshold for repeating imaging  Acute blood loss anemia- (present on admission) Baseline hgb: 9-10.  History of anemia of chronic disease  --GI was consulted, deferred endoscopy  -Received 2 units packed RBCs on 1/13 for hemoglobin 6.8   - FOBT 1/12 negative. -Follow H&H.  DVT (deep venous thrombosis) ([place])- (present on admission) -History of PE and DVT  -Initially Eliquis was placed on hold due to ABLA, now resumed -IVC filter was placed in 2015 at the time of pacemaker placement -H&H currently stable, 8.2, continue eliquis  Acute renal failure superimposed on stage 3a chronic kidney disease ([place])- (present on admission) -Creatinine peaked at 3.21, -AKI resolved, creatinine stable 1.2, follow closely  Chronic diastolic CHF (congestive heart failure) ([place])- (present on admission) -2D  echo 06/27/21: EF of 60-65% normal LVF. Diastolic parameters  indeterminate -TEE showed EF of 55 to 60%, RV normal, mild to moderate MR -Continue torsemide, sodium, creatinine stable.   Acute on chronic respiratory failure with hypoxia (HCC)- (present on admission) -Chronic respiratory failure secondary to ILD, pulmonary hypertension on 4L oxygen, originally required BiPAP, now back to baseline -Continue CPAP qhs for OSA   Hyperlipidemia- (present on admission) - Continue Crestor, LFTs normal  Pulmonary hypertension, unspecified ([place])- (present on admission) - Cardiology following, resumed sildenafil, macitentan, selexipag - Continue digoxin, on Eliquis, CPAP and home O2  Hypertension- (present on admission) - BP currently stable  Pressure injury of skin- (present on admission) Pressure Injury 06/29/21 Right Stage 1 -  Intact skin with non-blanchable redness of a localized area usually over a bony prominence. abrasion on right gluteal fold (Active)  06/29/21 1500  Location:   Location Orientation: Right  Staging: Stage 1 -  Intact skin with non-blanchable redness of a localized area usually over a bony prominence.  Wound Description (Comments): abrasion on right gluteal fold  Present on Admission: Yes       Diabetes ([place]) -Hemoglobin A1c of 5.4 on 06/25/2021 -Well-controlled, CBGs likely elevated in the setting of severe sepsis and steroids, continue sliding scale insulin -Place on SGLT2 inhibitor prior to discharge per cardiology    klebsiella pneumoniae UTI- (present on admission) - Completed course of antibiotics  Obesity (BMI 30-39.9)- (present on admission) BMI 39.7.  Healthy lifestyle recommended.  OSA (obstructive sleep apnea)- (present on admission) - Continue CPAP nightly    Subjective: States that his abdomen feels better today, no acute nausea vomiting or diarrhea.  No fevers  Objective Vitals:   07/07/21 0316 07/07/21 0736  BP: (!) 123/54 (!) 116/49  Pulse: 60 60  Resp: 15 17  Temp: 98.9 F (37.2 C) 98.7 F  (37.1 C)  SpO2: 98% 99%    Physical Exam General: Alert and oriented x 3, NAD Cardiovascular: S1 S2 clear, RRR. No pedal edema b/l Respiratory: CTAB, no wheezing, rales or rhonchi Gastrointestinal: Soft, nontender, nondistended, NBS Ext: no pedal edema bilaterally Psych: Normal affect and demeanor, alert and oriented x3    Data Reviewed:  My review of labs, imaging, notes and other tests shows no new significant findings.   Family Communication:   Disposition: Status is: Inpatient  Remains inpatient appropriate because: Overall improving, needs skilled nursing facility for rehab, medically stable     Author: Estill Cotta, MD 07/07/2021 11:24 AM  For on call review [email or web address].

## 2021-07-07 NOTE — Progress Notes (Signed)
Patient ID: Debra Saunders, female   DOB: 1945-07-28, 76 y.o.   MRN: [id number]  Chart reviewed, looks stable today.  Awaiting SNF.  Will see again Monday unless called.   [name] 07/07/2021

## 2021-07-07 NOTE — Progress Notes (Signed)
Placed patient on CPAP for the night set at 6cm and oxygen set at 4lpm.

## 2021-07-07 NOTE — Plan of Care (Signed)
  Problem: Education: Goal: Knowledge of General Education information will improve Description: Including pain rating scale, medication(s)/side effects and non-pharmacologic comfort measures Outcome: Progressing   Problem: Health Behavior/Discharge Planning: Goal: Ability to manage health-related needs will improve Outcome: Progressing   Problem: Clinical Measurements: Goal: Ability to maintain clinical measurements within normal limits will improve Outcome: Progressing Goal: Will remain free from infection Outcome: Progressing Goal: Diagnostic test results will improve Outcome: Progressing Goal: Respiratory complications will improve Outcome: Progressing Goal: Cardiovascular complication will be avoided Outcome: Progressing   Problem: Activity: Goal: Risk for activity intolerance will decrease Outcome: Progressing   Problem: Nutrition: Goal: Adequate nutrition will be maintained Outcome: Progressing   Problem: Coping: Goal: Level of anxiety will decrease Outcome: Progressing   Problem: Elimination: Goal: Will not experience complications related to bowel motility Outcome: Progressing Goal: Will not experience complications related to urinary retention Outcome: Progressing   Problem: Pain Managment: Goal: General experience of comfort will improve Outcome: Progressing   Problem: Safety: Goal: Ability to remain free from injury will improve Outcome: Progressing   Problem: Skin Integrity: Goal: Risk for impaired skin integrity will decrease Outcome: Progressing   Problem: Education: Goal: Knowledge of disease or condition will improve Outcome: Progressing Goal: Understanding of medication regimen will improve Outcome: Progressing Goal: Individualized Educational Video(s) Outcome: Progressing   Problem: Activity: Goal: Ability to tolerate increased activity will improve Outcome: Progressing   Problem: Cardiac: Goal: Ability to achieve and maintain  adequate cardiopulmonary perfusion will improve Outcome: Progressing   Problem: Health Behavior/Discharge Planning: Goal: Ability to safely manage health-related needs after discharge will improve Outcome: Progressing   

## 2021-07-08 ENCOUNTER — Telehealth (HOSPITAL_COMMUNITY): Payer: Self-pay | Admitting: Pharmacy Technician

## 2021-07-08 LAB — CBC
HCT: 25.1 % — ABNORMAL LOW (ref 36.0–46.0)
Hemoglobin: 8.1 g/dL — ABNORMAL LOW (ref 12.0–15.0)
MCH: 28.2 pg (ref 26.0–34.0)
MCHC: 32.3 g/dL (ref 30.0–36.0)
MCV: 87.5 fL (ref 80.0–100.0)
Platelets: 159 10*3/uL (ref 150–400)
RBC: 2.87 MIL/uL — ABNORMAL LOW (ref 3.87–5.11)
RDW: 14.6 % (ref 11.5–15.5)
WBC: 12.4 10*3/uL — ABNORMAL HIGH (ref 4.0–10.5)
nRBC: 0 % (ref 0.0–0.2)

## 2021-07-08 LAB — BASIC METABOLIC PANEL
Anion gap: 5 (ref 5–15)
BUN: 14 mg/dL (ref 8–23)
CO2: 31 mmol/L (ref 22–32)
Calcium: 7.8 mg/dL — ABNORMAL LOW (ref 8.9–10.3)
Chloride: 95 mmol/L — ABNORMAL LOW (ref 98–111)
Creatinine, Ser: 1.37 mg/dL — ABNORMAL HIGH (ref 0.44–1.00)
GFR, Estimated: 40 mL/min — ABNORMAL LOW (ref >60)
Glucose, Bld: 104 mg/dL — ABNORMAL HIGH (ref 70–99)
Potassium: 3.7 mmol/L (ref 3.5–5.1)
Sodium: 131 mmol/L — ABNORMAL LOW (ref 135–145)

## 2021-07-08 LAB — GLUCOSE, CAPILLARY
Glucose-Capillary: 111 mg/dL — ABNORMAL HIGH (ref 70–99)
Glucose-Capillary: 102 mg/dL — ABNORMAL HIGH (ref 70–99)
Glucose-Capillary: 147 mg/dL — ABNORMAL HIGH (ref 70–99)
Glucose-Capillary: 189 mg/dL — ABNORMAL HIGH (ref 70–99)

## 2021-07-08 MED ORDER — POTASSIUM CHLORIDE CRYS ER 20 MEQ PO TBCR
40.0000 meq | EXTENDED_RELEASE_TABLET | Freq: Once | ORAL | Status: AC
  Administered 2021-07-08: 40 meq via ORAL
  Filled 2021-07-08: qty 2

## 2021-07-08 NOTE — Telephone Encounter (Addendum)
Advanced Heart Failure Patient Advocate Encounter  Patient was approved for a TAF grant that will cover the cost of Opsumit and Uptravi.   Member Number [id number]  Group Number [id number] Coverage from date 06/23/2021 Coverage to date 06/22/2022 Claims Processing [place] PCN: [id number] BIN: [id number] Processing: 08 Phone: [phone] Fax: [phone]  Patient is currently hospitalized, will hold off sending new RXs to [facility] until she is discharged to ensure no medication changes are made.   Charlann Boxer, CPhT

## 2021-07-08 NOTE — Progress Notes (Addendum)
Physical Therapy Treatment Patient Details Name: Debra Saunders MRN: [id number] DOB: [birth year] Today's Date: 07/08/2021   History of Present Illness Pt is a 76 y.o. admitted 06/25/21 with c/o SOB, fever, chills, abdominal pain. Workup for septic shock secondary to klebsiella UTI and strep bacteremia with GIB, multifocal PNA, possible septic lung emboli. 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 Transfer to [facility] 06/29/21. Per cardiology, not a candidate for transvenous pacemaker lead extraction due to comorbidities. Pt developed afib with RVR; plan for TEE-guided DCCV 1/12. PMH includes HTN, afib, pacemaker, PAH, chronic resp failure (on 4L home O2), CHF, cardiorenal syndrome, DM2, OSA, stroke, ICH, chronic back pain, arthritis.    PT Comments    Pt feeling unwell most of the day, missed mobility this am.  Emphasis on transitions to EOB, transfer to [facility], BSC to progression of gait with 3 trials, back to recliner.  She is still very weak and debilitated and has agreed to rehab at SNF to improve conditioning before home    Recommendations for follow up therapy are one component of a multi-disciplinary discharge planning process, led by the attending physician.  Recommendations may be updated based on patient status, additional functional criteria and insurance authorization.  Follow Up Recommendations  Skilled nursing-short term rehab (<3 hours/day)     Assistance Recommended at Discharge Frequent or constant Supervision/Assistance  Patient can return home with the following A little help with walking and/or transfers;A little help with bathing/dressing/bathroom;Assistance with cooking/housework;Assist for transportation;Help with stairs or ramp for entrance   Equipment Recommendations  Rollator (4 wheels);Other (comment) (TBD)    Recommendations for Other Services       Precautions / Restrictions Precautions Precautions: Fall  (watch sats) Precaution Comments: bladder/bowel incontinence, watch HR, wears 4L O2 baseline, fatigues quickly     Mobility  Bed Mobility Overal bed mobility: Needs Assistance Bed Mobility: Supine to Sit     Supine to sit: Min assist (HOB down)     General bed mobility comments: scooted to EOB without assist, minimal assist forward.    Transfers Overall transfer level: Needs assistance Equipment used: Rolling walker (2 wheels) Transfers: Sit to/from Stand;Bed to chair/wheelchair/BSC Sit to Stand: Min assist Stand pivot transfers: Min assist;Min guard (min from bed, min guard from rollator and BSC)              Ambulation/Gait Ambulation/Gait assistance: Min guard Gait Distance (Feet): 25 Feet (then 45 and addtional 60 feet with rest between) Assistive device: Rollator (4 wheels) Gait Pattern/deviations: Step-through pattern Gait velocity: Decreased Gait velocity interpretation: <1.31 ft/sec, indicative of household ambulator   General Gait Details: slow and mildly labored with rollator.  cues for safety sit, standing and turns in rollator   Stairs             Wheelchair Mobility    Modified Rankin (Stroke Patients Only)       Balance     Sitting balance-Leahy Scale: Fair     Standing balance support: Bilateral upper extremity supported;During functional activity;Single extremity supported Standing balance-Leahy Scale: Poor Standing balance comment: reliant on at least single UE support; required assist for posterior pericare/hygiene while standing                            Cognition Arousal/Alertness: Awake/alert Behavior During Therapy: WFL for tasks assessed/performed Overall Cognitive Status: Within Functional Limits for tasks assessed  Exercises      General Comments General comments (skin integrity, edema, etc.): dyspnea 2-3/4, SpO2 on 4L 88% and 6L 93%  o/w VSS       Pertinent Vitals/Pain Pain Assessment: Faces Faces Pain Scale: No hurt Pain Intervention(s): Monitored during session    Home Living                          Prior Function            PT Goals (current goals can now be found in the care plan section) Acute Rehab PT Goals Patient Stated Goal: "I'll be going back to my own home" PT Goal Formulation: With patient Time For Goal Achievement: 07/14/21 Potential to Achieve Goals: Good Progress towards PT goals: Progressing toward goals    Frequency    Min 3X/week      PT Plan Current plan remains appropriate    Co-evaluation              AM-PAC PT "6 Clicks" Mobility   Outcome Measure  Help needed turning from your back to your side while in a flat bed without using bedrails?: A Little Help needed moving from lying on your back to sitting on the side of a flat bed without using bedrails?: A Little Help needed moving to and from a bed to a chair (including a wheelchair)?: A Little Help needed standing up from a chair using your arms (e.g., wheelchair or bedside chair)?: A Little Help needed to walk in hospital room?: A Little Help needed climbing 3-5 steps with a railing? : A Lot 6 Click Score: 17    End of Session Equipment Utilized During Treatment: Oxygen Activity Tolerance: Patient tolerated treatment well;Patient limited by fatigue Patient left: in chair;with call bell/phone within reach;with family/visitor present Nurse Communication: Mobility status PT Visit Diagnosis: Other abnormalities of gait and mobility (R26.89);Muscle weakness (generalized) (M62.81)     Time: 4650-3546 PT Time Calculation (min) (ACUTE ONLY): 39 min  Charges:  $Gait Training: 8-22 mins $Therapeutic Activity: 23-37 mins                     07/08/2021  Ginger Carne., PT Acute Rehabilitation Services [phone]  (pager) [phone]  (office)   Tessie Fass Larsen Zettel 07/08/2021, 4:26 PM

## 2021-07-08 NOTE — TOC Progression Note (Signed)
Transition of Care [facility]) - Progression Note    Patient Details  Name: Debra Saunders MRN: [id number] Date of Birth: 1945-06-29  Transition of [facility]) CM/SW Contact  Reece Agar, [place] Phone Number: 07/08/2021, 1:22 PM  Clinical Narrative:    CSW contacted [place] to follow up on pt insurance [place]. Admissions shared that [facility] is closed today and they will follow up tomorrow on the claim.   Expected Discharge Plan: Skilled Nursing Facility Barriers to Discharge: Continued Medical Work up  Expected Discharge Plan and Services Expected Discharge Plan: [place] In-house Referral: Clinical Social Work Discharge Planning Services: CM Consult Post Acute Care Choice: [place] arrangements for the past 2 months: Single Family Home                                       Social Determinants of Health (SDOH) Interventions Food Insecurity Interventions: Intervention Not Indicated Financial Strain Interventions: Intervention Not Indicated Housing Interventions: Intervention Not Indicated Transportation Interventions: [employer], Other (Comment) (family and friends also help provide transportation)  Readmission Risk Interventions No flowsheet data found.

## 2021-07-08 NOTE — Progress Notes (Signed)
Progress Note   Patient: Debra Saunders DOB: 12-02-1945 DOA: 06/29/2021     9 DOS: the patient was seen and examined on 07/08/2021   Brief hospital course: 76 year old female with hypertension, hyperlipidemia, diabetes, SDH 2014, SA node dysfunction status post pacemaker, OSA, PAH, interstitial lung disease on chronic 4 L O2, diastolic CHF, A. fib on Eliquis, CKD stage III AAA, cardiorenal syndrome, DVT, initially presented to [facility] ED on 06/25/2021 with shortness of breath, cough, fevers, chills dysuria and right upper quadrant pain.  Patient was admitted with severe sepsis, septic shock secondary to multifocal pneumonia, septic emboli and strep bacteremia, Klebsiella UTI.  Initially required Levophed, stress dose steroids for short time.  TEE 1/6 consistent with infected pacemaker lead with vegetation.  Patient also required 1 unit of packed RBCs for hemoglobin of 7, no plans for endoscopy. Acute on chronic CKD has improved.  LFTs has improved (shock liver) Patient was transferred to [facility] on 1/7 for pacer extraction, EP cardiology and cardiology were consulted, not considered a candidate for transvenous lead extraction due to significant comorbidities.  Patient also developed A. fib with RVR and was started on amiodarone. ID is following, will need prolonged course of antibiotic treatment. Underwent TEE/DCCV on 07/04/2021.  Now awaiting skilled nursing facility for rehab  Assessment and Plan * Severe sepsis with septic shock secondary to infected pacemaker with streptoccos bacteremia - (present on admission) - Blood cultures 1/03 + Streptococcus group G -TEE 1/6 large mobile vegetation on the RV pacing wire noted in the right atrium with estimated 1x 1.5 cm in size or larger -Blood cultures 1/5 no growth -Septic shock physiology resolved, received IV Levophed and fluids. Cardiology, ID following. -Per EP cardiology, not a candidate for transvenous lead extraction because of  significant comorbid conditions. 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   -Patient passed amoxicillin challenge. Per ID start penicillin G to complete 6 weeks of antibiotics, last day of treatment on 08/08/2021 followed by suppressive therapy with amoxicillin.  Outpatient follow-up with ID.   Atrial fibrillation with tachy brady syndrome - (present on admission) -Patient was started on IV amiodarone on 1/8 due to atrial fibrillation with RVR.  Failed rate control with oral Cardizem. -Was on amiodarone but was stopped in 2019 due to lung disease.  Continue digoxin -Underwent successful DCCV.  TEE showed vegetation still present on pacemaker lead in RA and tricuspid valve -Continue eliquis, no acute issues, awaiting SNF  Abdominal pain- (present on admission) - Unclear etiology, CT abdomen on 06/24/2021 negative for acute abdominal pathology.  Right abd  US showed no acute abnormality, history of cholecystectomy -Improved, tolerating diet  Acute blood loss anemia- (present on admission) Baseline hgb: 9-10.  History of anemia of chronic disease  --GI was consulted, deferred endoscopy  -Received 2 units packed RBCs on 1/13 for hemoglobin 6.8   - FOBT 1/12 negative. -H&H stable  DVT (deep venous thrombosis) ([place])- (present on admission) -History of PE and DVT  -Initially Eliquis was placed on hold due to ABLA, now resumed -IVC filter was placed in 2015 at the time of pacemaker placement -H&H stable, continue eliquis  Acute renal failure superimposed on stage 3a chronic kidney disease ([place])- (present on admission) -Creatinine peaked at 3.21, -Creatinine stable at 1.3, on torsemide, monitor  Chronic diastolic CHF (congestive heart failure) ([place])- (present on admission) -2D echo 06/27/21: EF of 60-65% normal LVF. Diastolic parameters indeterminate -TEE showed EF of 55 to  60%, RV normal, mild to moderate MR -Continue torsemide.  Creatinine  stable  Acute on chronic respiratory failure with hypoxia (HCC)- (present on admission) -Chronic respiratory failure secondary to ILD, pulmonary hypertension on 4L oxygen, originally required BiPAP, now back to baseline -Continue CPAP qhs for OSA   Hyperlipidemia- (present on admission) - Continue Crestor, LFTs normal  Pulmonary hypertension, unspecified ([place])- (present on admission) - Cardiology following, resumed sildenafil, macitentan, selexipag - Continue digoxin, on Eliquis, CPAP and home O2  Hypertension- (present on admission) - BP currently stable  Pressure injury of skin- (present on admission) Pressure Injury 06/29/21 Right Stage 1 -  Intact skin with non-blanchable redness of a localized area usually over a bony prominence. abrasion on right gluteal fold (Active)  06/29/21 1500  Location:   Location Orientation: Right  Staging: Stage 1 -  Intact skin with non-blanchable redness of a localized area usually over a bony prominence.  Wound Description (Comments): abrasion on right gluteal fold  Present on Admission: Yes       Diabetes ([place]) -Hemoglobin A1c of 5.4 on 06/25/2021 -Well-controlled, CBGs likely elevated in the setting of severe sepsis and steroids, continue sliding scale insulin -Place on SGLT2 inhibitor prior to discharge per cardiology    klebsiella pneumoniae UTI- (present on admission) - Completed course of antibiotics  Obesity (BMI 30-39.9)- (present on admission) BMI 39.7.  Healthy lifestyle recommended.  OSA (obstructive sleep apnea)- (present on admission) - Continue CPAP nightly     Subjective: No complaints, awaiting skilled nursing facility  Objective BP 97/75 (BP Location: Left Wrist)    Pulse 70    Temp 99.1 F (37.3 C)    Resp 18    Ht 5' (1.524 m)    Wt 90.6 kg    SpO2 96%    BMI 39.01 kg/m   Physical Exam General: Alert and oriented x 3, NAD Cardiovascular: S1 S2 clear, RRR. No pedal edema b/l Respiratory: CTAB, no wheezing,  rales or rhonchi Gastrointestinal: Soft, nontender, nondistended, NBS Ext: no pedal edema bilaterally  Data Reviewed:  My review of labs, imaging, notes and other tests shows no new significant findings.   Family Communication:   Disposition: Status is: Inpatient  Remains inpatient appropriate because: Medically stable, awaiting skilled nursing facility  DVT prophylaxis: On apixaban    Time spent: 25 minutes  Author: Estill Cotta, MD 07/08/2021 1:41 PM  For on call review [email or web address].

## 2021-07-08 NOTE — Plan of Care (Signed)
°  Problem: Education: Goal: Knowledge of General Education information will improve Description: Including pain rating scale, medication(s)/side effects and non-pharmacologic comfort measures Outcome: Progressing   Problem: Clinical Measurements: Goal: Will remain free from infection Outcome: Progressing   Problem: Coping: Goal: Level of anxiety will decrease Outcome: Progressing   Problem: Pain Managment: Goal: General experience of comfort will improve Outcome: Progressing   

## 2021-07-08 NOTE — Progress Notes (Signed)
Pt placed on nasal CPAP with 4 lpm bled in.  Tolerating well.

## 2021-07-08 NOTE — Plan of Care (Signed)
  Problem: Education: Goal: Knowledge of General Education information will improve Description: Including pain rating scale, medication(s)/side effects and non-pharmacologic comfort measures Outcome: Progressing   Problem: Clinical Measurements: Goal: Ability to maintain clinical measurements within normal limits will improve Outcome: Progressing Goal: Diagnostic test results will improve Outcome: Progressing Goal: Respiratory complications will improve Outcome: Progressing Goal: Cardiovascular complication will be avoided Outcome: Progressing   Problem: Activity: Goal: Risk for activity intolerance will decrease Outcome: Progressing   Problem: Nutrition: Goal: Adequate nutrition will be maintained Outcome: Progressing   

## 2021-07-08 NOTE — Progress Notes (Addendum)
Patient ID: Debra Saunders, female   DOB: [birth year], 76 y.o.   MRN: [id number]    Advanced Heart Failure Rounding Note   Subjective:    Bld cultures 1/5---> No growth.   1/9- EP consulted. Not a candidate for extraction d/t comorbid conditions.  1/11 PICC placed.  1/12 S/P TEE DC/CV with restoration of SR.   Creatinine stable at 1.37 Na 126>133>131  Denies CP or dyspnea. Continues to have some orthostatic dizziness. Took compression hose off yesterday d/t discomfort.     Objective:     Vital Signs:   Temp:  [98.7 F (37.1 C)-99.1 F (37.3 C)] 99 F (37.2 C) (01/16 0339) Pulse Rate:  [60-71] 69 (01/16 0339) Resp:  [16-20] 17 (01/16 0339) BP: (102-116)/(39-49) 102/40 (01/16 0339) SpO2:  [92 %-99 %] 98 % (01/16 0339) Weight:  [90.6 kg] 90.6 kg (01/16 0500) Last BM Date: 07/07/21  Weight change: Filed Weights   07/06/21 0605 07/07/21 0624 07/08/21 0500  Weight: 90.6 kg 90.8 kg 90.6 kg    Intake/Output:   Intake/Output Summary (Last 24 hours) at Saunders 0701 Last data filed at Saunders 0400 Gross per 24 hour  Intake 395.7 ml  Output 950 ml  Net -554.3 ml     Physical Exam: General:  Lying comfortably in bed HEENT: normal Neck: supple. no JVD. Carotids 2+ bilat; no bruits. No lymphadenopathy or thryomegaly appreciated. Cor: PMI nondisplaced. Regular rate & rhythm. No rubs, gallops or murmurs. Lungs: clear Abdomen: soft, nontender, nondistended. No hepatosplenomegaly. No bruits or masses. Good bowel sounds. Extremities: no cyanosis, clubbing, rash, lymphedema LLE, trace edema on right Neuro: alert & orientedx3, cranial nerves grossly intact. moves all 4 extremities w/o difficulty. Affect pleasant   Telemetry: AV paced 60s Personally reviewed  Labs: Basic Metabolic Panel: Recent Labs  Lab 07/02/21 0142 07/03/21 0051 07/04/21 0531 07/05/21 0104 07/06/21 0500 07/06/21 1209 07/07/21 0110 07/08/21 0355  NA 133* 131*   < > 131* 126* 133* 133* 131*   K 4.0 4.2   < > 3.9 5.3* 3.8 3.7 3.7  CL 92* 92*   < > 95* 87* 94* 95* 95*  CO2 35* 32   < > 31 28 30 30 31   GLUCOSE 187* 191*   < > 126* 377* 175* 123* 104*  BUN 13 14   < > 15 12 13 12 14   CREATININE 1.02* 1.27*   < > 1.40* 1.23* 1.24* 1.28* 1.37*  CALCIUM 7.8* 7.9*   < > 7.3* 7.2* 7.9* 7.8* 7.8*  MG 1.6* 2.1  --   --   --   --   --   --    < > = values in this interval not displayed.    Liver Function Tests: No results for input(s): AST, ALT, ALKPHOS, BILITOT, PROT, ALBUMIN in the last 168 hours.  No results for input(s): LIPASE, AMYLASE in the last 168 hours.  No results for input(s): AMMONIA in the last 168 hours.  CBC: Recent Labs  Lab 07/04/21 0531 07/05/21 1114 07/06/21 0500 07/07/21 0110 07/08/21 0355  WBC 19.0* 12.2* 14.0* 13.4* 12.4*  HGB 8.2* 6.8* 8.7* 8.2* 8.1*  HCT 26.1* 22.3* 27.1* 26.2* 25.1*  MCV 87.9 86.8 86.6 86.5 87.5  PLT 176 151 147* 152 159    Cardiac Enzymes: No results for input(s): CKTOTAL, CKMB, CKMBINDEX, TROPONINI in the last 168 hours.  BNP: BNP (last 3 results) Recent Labs    08/24/20 1506 03/27/21 1530 06/25/21 1350  BNP 51.2 31.1 344.7*  ProBNP (last 3 results) No results for input(s): PROBNP in the last 8760 hours.    Other results:  Imaging: No results found.   Medications:     Scheduled Medications:  sodium chloride   Intravenous Once   amiodarone  200 mg Oral BID   apixaban  5 mg Oral BID   benzonatate  200 mg Oral TID   Chlorhexidine Gluconate Cloth  6 each Topical Daily   cyclopentolate  1 drop Both Eyes QHS   Difluprednate  1 drop Both Eyes BID   digoxin  0.125 mg Oral Daily   DULoxetine  60 mg Oral Daily   gabapentin  600 mg Oral TID   insulin aspart  0-9 Units Subcutaneous TID WC   macitentan  10 mg Oral Daily   pantoprazole (PROTONIX) IV  40 mg Intravenous Q24H   polyethylene glycol  17 g Oral BID   rosuvastatin  10 mg Oral Daily   Selexipag  1,600 mcg Oral BID   sildenafil  20 mg Oral TID    sodium chloride flush  10-40 mL Intracatheter Q12H   sodium chloride flush  3 mL Intravenous Q12H   torsemide  40 mg Oral BID    Infusions:  sodium chloride     penicillin g continuous IV infusion 8 Million Units (07/08/21 0355)    PRN Medications: sodium chloride, acetaminophen **OR** acetaminophen, albuterol, cholestyramine light, diphenhydrAMINE, EPINEPHrine, guaiFENesin-codeine, hydrALAZINE, HYDROcodone-acetaminophen, morphine injection, phenol, sodium chloride flush   Assessment/Plan:   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  Group G strep bacteremia.  Septic emboli to lungs. Seen by EP, thought to be too high risk for device extraction.  - ID following.  - She will need PCN G IV at home x 6 wks.  Has PICC.  2. Acute on chronic hypoxemic respiratory failure: She developed PNA from septic emboli to lungs.  At baseline, on 4L home oxygen with CPAP at night for interstitial lung disease and OSA.   - Antibiotics as above for PNA.  - She is down to 4 L by [place] (baseline).  3. Pulmonary hypertension: Based on 4/19 cath, she had severe PAH with PVR 7.2 WU. Chronic hypoxemic respiratory failure, on home oxygen.  She has a history of remote DVT (2004) but V/Q scan negative for evidence of chronic PE.  High resolution CT chest in 10/19 showed pulmonary fibrosis, and PFTs in 2017 showed severe restriction.  Serological workup for rheumatological diseases was unremarkable.  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 Amiodarone was stopped in past given lung parenchymal disease. 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  - She is back on sildenafil 20 tid and macitentan 10 daily.  - Selexipag increased back to home dose 1600 bid.   - Continue digoxin for RV support.  - Home oxygen, CPAP.  4. Atrial fibrillation with tachy-brady  syndrome:  Had been off amiodarone since 2019 due to interstitial lung disease/pulmonary fibrosis. Amiodarone restarted, now s/p TEE-guided DCCV this admission - Appears AV paced this am - Continue amiodarone 200 mg po bid for now.  - Continue Eliquis 5 mg bid.   5. AKI: Resolved, creatinine 1.37.  6. 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  Volume status looks ok today.  - Continue home torsemide 40 mg bid.  7. HTN: BP controlled.  Now with orthostatic symptoms.  - TED hose removed d/t discomfort, she is willing to try again. Discussed with RN.  8. H/o DVT: Anticoagulated.  9. Left leg lymphedema: Chronic.  10. OSA: CPAP.  11. Hyponatremia:  - Na 126 > 133 > 133 > 131 - Fluid restrict.  12. Anemia: -FOBT negative 01/12 - Hgb down to 6.8 on 01/13. Received 2 U PRBCs - Hgb 8.1 this am - Daily CBC  She will need SNF for rehab. Awaiting placement.   [facility], Debra Saunders 7:01 AM  Patient seen with PA, agree with the above note.   Creatinine and weight stable.  Gets short of breath walking in hall but overall improved. She is on her baseline 4L oxygen by [place].    General: NAD Neck: No JVD, no thyromegaly or thyroid nodule.  Lungs: Clear to auscultation bilaterally with normal respiratory effort. CV: Nondisplaced PMI.  Heart regular S1/S2, no S3/S4, 2/6 HSM LLSB.  No peripheral edema.   Abdomen: Soft, nontender, no hepatosplenomegaly, no distention.  Skin: Intact without lesions or rashes.  Neurologic: Alert and oriented x 3.  Psych: Normal affect. Extremities: No clubbing or cyanosis.  HEENT: Normal.   Volume status ok, continue toresmide 40 mg bid.  Continue her PH medications.   She will be on long-term PCN G for endocarditis involving PPM lead and tricuspid valve.  She is not a candidate for  device extraction (has been seen by EP).    Awaiting SNF placement.   [name] Saunders 1:10 PM

## 2021-07-09 LAB — CBC
HCT: 24.4 % — ABNORMAL LOW (ref 36.0–46.0)
Hemoglobin: 7.7 g/dL — ABNORMAL LOW (ref 12.0–15.0)
MCH: 27.8 pg (ref 26.0–34.0)
MCHC: 31.6 g/dL (ref 30.0–36.0)
MCV: 88.1 fL (ref 80.0–100.0)
Platelets: 170 10*3/uL (ref 150–400)
RBC: 2.77 MIL/uL — ABNORMAL LOW (ref 3.87–5.11)
RDW: 14.6 % (ref 11.5–15.5)
WBC: 12.9 10*3/uL — ABNORMAL HIGH (ref 4.0–10.5)
nRBC: 0 % (ref 0.0–0.2)

## 2021-07-09 LAB — FERRITIN: Ferritin: 728 ng/mL — ABNORMAL HIGH (ref 11–307)

## 2021-07-09 LAB — BASIC METABOLIC PANEL
Anion gap: 6 (ref 5–15)
BUN: 16 mg/dL (ref 8–23)
CO2: 30 mmol/L (ref 22–32)
Calcium: 7.9 mg/dL — ABNORMAL LOW (ref 8.9–10.3)
Chloride: 94 mmol/L — ABNORMAL LOW (ref 98–111)
Creatinine, Ser: 1.54 mg/dL — ABNORMAL HIGH (ref 0.44–1.00)
GFR, Estimated: 34 mL/min — ABNORMAL LOW (ref >60)
Glucose, Bld: 125 mg/dL — ABNORMAL HIGH (ref 70–99)
Potassium: 4.1 mmol/L (ref 3.5–5.1)
Sodium: 130 mmol/L — ABNORMAL LOW (ref 135–145)

## 2021-07-09 LAB — RESP PANEL BY RT-PCR (FLU A&B, COVID) ARPGX2
Influenza A by PCR: NEGATIVE
Influenza B by PCR: NEGATIVE
SARS Coronavirus 2 by RT PCR: NEGATIVE

## 2021-07-09 LAB — GLUCOSE, CAPILLARY
Glucose-Capillary: 124 mg/dL — ABNORMAL HIGH (ref 70–99)
Glucose-Capillary: 125 mg/dL — ABNORMAL HIGH (ref 70–99)
Glucose-Capillary: 131 mg/dL — ABNORMAL HIGH (ref 70–99)
Glucose-Capillary: 114 mg/dL — ABNORMAL HIGH (ref 70–99)

## 2021-07-09 LAB — IRON AND TIBC
Iron: 19 ug/dL — ABNORMAL LOW (ref 28–170)
Saturation Ratios: 9 % — ABNORMAL LOW (ref 10.4–31.8)
TIBC: 210 ug/dL — ABNORMAL LOW (ref 250–450)
UIBC: 191 ug/dL

## 2021-07-09 LAB — DIGOXIN LEVEL: Digoxin Level: 1.2 ng/mL (ref 0.8–2.0)

## 2021-07-09 MED ORDER — IPRATROPIUM-ALBUTEROL 0.5-2.5 (3) MG/3ML IN SOLN
3.0000 mL | Freq: Three times a day (TID) | RESPIRATORY_TRACT | Status: DC
  Administered 2021-07-09 (×2): 3 mL via RESPIRATORY_TRACT
  Filled 2021-07-09 (×3): qty 3

## 2021-07-09 MED ORDER — TORSEMIDE 40 MG PO TABS: 40.0000 mg | ORAL_TABLET | Freq: Every day | ORAL | Status: DC

## 2021-07-09 MED ORDER — TORSEMIDE 20 MG PO TABS: 20.0000 mg | ORAL_TABLET | Freq: Every evening | ORAL | Status: DC

## 2021-07-09 MED ORDER — AMIODARONE HCL 200 MG PO TABS: 200.0000 mg | ORAL_TABLET | Freq: Two times a day (BID) | ORAL | Status: DC

## 2021-07-09 MED ORDER — TORSEMIDE 20 MG PO TABS
40.0000 mg | ORAL_TABLET | Freq: Every day | ORAL | Status: DC
  Administered 2021-07-09 – 2021-07-11 (×3): 40 mg via ORAL
  Filled 2021-07-09 (×3): qty 2

## 2021-07-09 MED ORDER — IPRATROPIUM-ALBUTEROL 0.5-2.5 (3) MG/3ML IN SOLN
3.0000 mL | Freq: Three times a day (TID) | RESPIRATORY_TRACT | Status: DC
  Filled 2021-07-09: qty 3

## 2021-07-09 MED ORDER — AMIODARONE HCL 200 MG PO TABS: 200.0000 mg | ORAL_TABLET | Freq: Every day | ORAL | Status: DC

## 2021-07-09 MED ORDER — CHOLESTYRAMINE LIGHT 4 G PO PACK: 4.0000 g | PACK | Freq: Every day | ORAL | Status: DC | PRN

## 2021-07-09 MED ORDER — BENZONATATE 200 MG PO CAPS: 200.0000 mg | ORAL_CAPSULE | Freq: Three times a day (TID) | ORAL | 0 refills | Status: DC | PRN

## 2021-07-09 MED ORDER — TORSEMIDE 20 MG PO TABS
20.0000 mg | ORAL_TABLET | Freq: Every evening | ORAL | Status: DC
  Administered 2021-07-09 – 2021-07-10 (×2): 20 mg via ORAL
  Filled 2021-07-09 (×2): qty 1

## 2021-07-09 MED ORDER — PENICILLIN G POTASSIUM IV (FOR PTA / DISCHARGE USE ONLY): 16.0000 10*6.[IU] | INTRAVENOUS | 0 refills | Status: DC

## 2021-07-09 NOTE — Discharge Summary (Signed)
Physician Discharge Summary   Patient: Debra Saunders MRN: [id number] DOB: 1946/02/14  Admit date:     06/29/2021  Discharge date: 07/09/21  Discharge Physician: Estill Cotta   PCP: [name], MD   Recommendations at discharge:   Aldactone, potassium discontinued  Amiodarone 200 mg p.o. daily at the time of discharge. Torsemide 40 mg every morning, 20 mg every evening Continue IV penicillin G to complete 6 weeks of antibiotics, last day of treatment on 08/08/2021  Discharge Diagnoses    Severe sepsis with septic shock secondary to infected pacemaker with streptoccos bacteremia    Atrial fibrillation with tachy brady syndrome    Hypertension   Pulmonary hypertension, unspecified (HCC)   Hyperlipidemia   Acute on chronic respiratory failure with hypoxia (HCC)   Chronic diastolic CHF (congestive heart failure) (HCC)   Acute renal failure superimposed on stage 3a chronic kidney disease ([place]) History of DVT (deep venous thrombosis) (HCC)   Acute blood loss anemia   Abdominal pain   Pressure injury of skin   OSA (obstructive sleep apnea)   Obesity (BMI 30-39.9)   klebsiella pneumoniae UTI     Hospital Course   76 year old female with hypertension, hyperlipidemia, diabetes, SDH 2014, SA node dysfunction status post pacemaker, OSA, PAH, interstitial lung disease on chronic 4 L O2, diastolic CHF, A. fib on Eliquis, CKD stage III AAA, cardiorenal syndrome, DVT, initially [facility] ED [facility]21 with shortness of breath, cough, fevers, chills dysuria and right upper quadrant pain.  Patient was admitted with severe sepsis, septic shock secondary to multifocal pneumonia, septic emboli and strep bacteremia, Klebsiella UTI.  Initially required Levophed, stress dose steroids for short time.  TEE 1/6 consistent with infected pacemaker lead with vegetation.  Patient also required 1 unit of packed RBCs for hemoglobin of 7, no plans for endoscopy. Acute on chronic CKD has  improved.  LFTs has improved (shock liver) Patient was transferred to [facility] on 1/7 for pacer extraction, EP cardiology and cardiology were consulted, not considered a candidate for transvenous lead extraction due to significant comorbidities.  Patient also developed A. fib with RVR and was started on amiodarone. ID is following, will need prolonged course of antibiotic treatment. Underwent TEE/DCCV on 07/04/2021.   * Severe sepsis with septic shock secondary to infected pacemaker with streptoccos bacteremia - (present on admission) - Blood cultures 1/03 + Streptococcus group G -TEE 1/6 large mobile vegetation on the RV pacing wire noted in the right atrium with estimated 1x 1.5 cm in size or larger -Blood cultures 1/5 no growth -Septic shock physiology resolved, received IV Levophed and fluids. Cardiology, ID following. -Per EP cardiology, not a candidate for transvenous lead extraction because of significant comorbid conditions. 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   -Patient passed amoxicillin challenge. Per ID start penicillin G to complete 6 weeks of antibiotics, last day of treatment on 08/08/2021 followed by suppressive therapy with amoxicillin.  Outpatient follow-up with ID.   Atrial fibrillation with tachy brady syndrome - (present on admission) -Patient was started on IV amiodarone on 1/8 due to atrial fibrillation with RVR.  Failed rate control with oral Cardizem,  was on amiodarone but was stopped in 2019 due to lung disease.  -Underwent successful DCCV.  TEE showed vegetation still present on pacemaker lead in RA and tricuspid valve -Continue eliquis, no acute issues  -Per cardiology recommendations, continue amiodarone 200 mg daily, digoxin  Abdominal pain- (present on  admission) - Unclear etiology, CT abdomen on 06/24/2021 negative for acute abdominal pathology.  Right abd  US showed no acute abnormality, history of  cholecystectomy -Improved, tolerating diet  Acute blood loss anemia- (present on admission) Baseline hgb: 9-10.  History of anemia of chronic disease  --GI was consulted, deferred endoscopy  -Received 2 units packed RBCs on 1/13 for hemoglobin 6.8   - FOBT 1/12 negative. -H&H stable  DVT (deep venous thrombosis) ([place])- (present on admission) -History of PE and DVT  -Initially Eliquis was placed on hold due to ABLA, now resumed -IVC filter was placed in 2015 at the time of pacemaker placement -H&H stable, continue eliquis  Acute renal failure superimposed on stage 3a chronic kidney disease ([place])- (present on admission) -Creatinine peaked at 3.21, -Creatinine slightly trending up, torsemide decreased 40 mg every morning, 20 mg every evening  Chronic diastolic CHF (congestive heart failure) ([place])- (present on admission) -2D echo 06/27/21: EF of 60-65% normal LVF. Diastolic parameters indeterminate -TEE showed EF of 55 to 60%, RV normal, mild to moderate MR -Creatinine slightly trending up, decreased torsemide to 40 mg in the morning 20 mg p.m. -Aldactone discontinued  Acute on chronic respiratory failure with hypoxia (HCC)- (present on admission) -Chronic respiratory failure secondary to ILD, pulmonary hypertension on 4L oxygen, originally required BiPAP, now back to baseline -Continue CPAP qhs for OSA   Hyperlipidemia- (present on admission) - Continue Crestor, LFTs normal  Pulmonary hypertension, unspecified ([place])- (present on admission) - Cardiology following, resumed sildenafil, macitentan, selexipag - Continue digoxin, on Eliquis, CPAP and home O2  Hypertension- (present on admission) - BP currently stable  Pressure injury of skin- (present on admission) Pressure Injury 06/29/21 Right Stage 1 -  Intact skin with non-blanchable redness of a localized area usually over a bony prominence. abrasion on right gluteal fold (Active)  06/29/21 1500  Location:   Location  Orientation: Right  Staging: Stage 1 -  Intact skin with non-blanchable redness of a localized area usually over a bony prominence.  Wound Description (Comments): abrasion on right gluteal fold  Present on Admission: Yes   Diabetes ([place]) -Hemoglobin A1c of 5.4 on 06/25/2021 -Well-controlled, CBGs likely elevated in the setting of severe sepsis and steroids,   klebsiella pneumoniae UTI- (present on admission) - Completed course of antibiotics for UTI  Obesity (BMI 30-39.9)- (present on admission) BMI 39.7.  Healthy lifestyle recommended.  OSA (obstructive sleep apnea)- (present on admission) - Continue CPAP nightly    Pain control - [place] Controlled Substance Reporting System database was reviewed. and patient was instructed, not to drive, operate heavy machinery, perform activities at heights, swimming or participation in water activities or provide baby-sitting services while on Pain, Sleep and Anxiety Medications; until their outpatient Physician has advised to do so again. Also recommended to not to take more than prescribed Pain, Sleep and Anxiety Medications.   Consultants:  Cardiology Infectious disease Gastroenterology  Procedures performed:  TEE 1/6 DCCV TEE on 1/12  Disposition: Skilled nursing facility Diet recommendation: Cardiac diet  DISCHARGE MEDICATION: Allergies as of 07/09/2021       Reactions   Aspirin Anaphylaxis, Hives   Pt states that she is unable to take because she had bleeding in her brain   Other Other (See Comments)   Pt states that she is unable to take blood thinners because she had bleeding in her brain; Blood Thinners-per doctor at [facility] (Xarelto)        Medication List     STOP  taking these medications    potassium chloride SA 20 MEQ tablet Commonly known as: KLOR-CON M   spironolactone 25 MG tablet Commonly known as: ALDACTONE       TAKE these medications    acetaminophen 325 MG tablet Commonly known as:  TYLENOL Take 325-650 mg by mouth every 6 (six) hours as needed for mild pain.   albuterol 0.63 MG/3ML nebulizer solution Commonly known as: ACCUNEB Take 1 ampule by nebulization every 6 (six) hours as needed for wheezing.   amiodarone 200 MG tablet Commonly known as: PACERONE Take 1 tablet (200 mg total) by mouth daily.   apixaban 5 MG Tabs tablet Commonly known as: ELIQUIS Take 5 mg by mouth 2 (two) times daily.   benzonatate 200 MG capsule Commonly known as: TESSALON Take 1 capsule (200 mg total) by mouth 3 (three) times daily as needed for cough.   BIOFREEZE EX Apply 1 application topically 4 (four) times daily as needed (for pain).   cholestyramine light 4 g packet Commonly known as: PREVALITE Take 1 packet (4 g total) by mouth daily as needed (diarrhea).   Difluprednate 0.05 % Emul Place 1 drop into both eyes 2 (two) times daily.   digoxin 0.125 MG tablet Commonly known as: LANOXIN Take by mouth daily.   DULoxetine 30 MG capsule Commonly known as: CYMBALTA TAKE 1 CAPSULE BY MOUTH ONCE DAILY FOR 14 DAYS, THEN INCREASE TO 2 CAPSULES ONCE DAILY What changed: See the new instructions.   gabapentin 300 MG capsule Commonly known as: NEURONTIN Take 600 mg by mouth 3 (three) times daily.   macitentan 10 MG tablet Commonly known as: OPSUMIT Take 10 mg by mouth daily.   meclizine 25 MG tablet Commonly known as: ANTIVERT Take 1 tablet (25 mg total) by mouth 3 (three) times daily as needed for dizziness.   penicillin G  IVPB Inject 16 Million Units into the vein daily. Administer as a continuous infusion Indication:  Strep Bacteremia and Pacemaker Vegetation First Dose: Yes Last Day of Therapy:  08/08/21 Labs - Once weekly:  CBC/D and BMP, Labs - Every other week:  ESR and CRP Method of administration: Elastomeric (Continuous infusion) Method of administration may be changed at the discretion of home infusion pharmacist based upon assessment of the patient and/or  caregiver's ability to self-administer the medication ordered.   rosuvastatin 10 MG tablet Commonly known as: CRESTOR Take 1 tablet by mouth once daily   sildenafil 20 MG tablet Commonly known as: REVATIO Take 20 mg by mouth 3 (three) times daily.   torsemide 20 MG tablet Commonly known as: DEMADEX Take 1 tablet (20 mg total) by mouth every evening. What changed: You were already taking a medication with the same name, and this prescription was added. Make sure you understand how and when to take each.   Torsemide 40 MG Tabs Take 40 mg by mouth daily with breakfast. Start taking on: July 10, 2021 What changed:  medication strength when to take this   Uptravi 1600 MCG Tabs Generic drug: Selexipag Take 1,600 mcg by mouth.               Durable Medical Equipment  (From admission, onward)           Start     Ordered   07/04/21 0724  Heart failure home health orders  (Heart failure home health orders / Face to face)  Once       Comments: Heart Failure Follow-up Care:  Verify follow-up appointments  per Patient Discharge Instructions. Confirm transportation arranged. Reconcile home medications with discharge medication list. Remove discontinued medications from use. Assist patient/caregiver to manage medications using pill box. Reinforce low sodium food selection BMET and CBC weekly Assessments: Vital signs and oxygen saturation at each visit. Assess home environment for safety concerns, caregiver support and availability of low-sodium foods. Consult Education officer, museum, PT/OT, Dietitian, and CNA based on assessments. Perform comprehensive cardiopulmonary assessment. Notify MD for any change in condition or weight gain of 3 pounds in one day or 5 pounds in one week with symptoms. Daily Weights and Symptom Monitoring: Ensure patient has access to scales. Teach patient/caregiver to weigh daily before breakfast and after voiding using same scale and record.    Teach  patient/caregiver to track weight and symptoms and when to notify Provider. Activity: Develop individualized activity plan with patient/caregiver.  Question Answer Comment  Heart Failure Follow-up Care Advanced [facility] Failure ([facility]F) [facility] at [place] Visits Set up telemonitoring equipment to monitor daily vital signs, weights and oxygen saturation   Obtain the following labs Other see comments   Lab frequency Weekly   Fax lab results to AHF Clinic at [phone]   Diet Low Sodium Heart Healthy   Fluid restrictions: 1800 mL Fluid   Skilled Nurse to notify MD of weight trends weekly for first 2 weeks. May fax or call: [facility] Clinic at [phone] (fax) or [phone]      07/04/21 0724   07/02/21 1215  For home use only DME continuous positive airway pressure (CPAP)  Once       Comments: Autotitration 5/20 CPAP needed, old device is not functioning (not repairable), benefits from wearing CPAP  Question Answer Comment  Length of Need Lifetime   Patient has OSA or probable OSA Yes   Is the patient currently using CPAP in the home Yes   If yes (to question two) Determine DME provider and inform them of any new orders/settings   Date of face to face encounter 07/02/2021   Settings Other see comments   Signs and symptoms of probable OSA  (select all that apply) Snoring   CPAP supplies needed Mask, headgear, cushions, filters, heated tubing and water chamber      07/02/21 1214              Discharge Care Instructions  (From admission, onward)           Start     Ordered   07/09/21 0000  Change dressing on IV access line weekly and PRN  (Home infusion instructions - [facility] )        07/09/21 1318            Follow-up Information     Golden Circle, FNP Follow up.   Specialty: Infectious Diseases Why: 9:30 am on 07/25/21. Please call to reschedule if you are not able to make this appointment. Contact information: [address] [place] [address] [phone]         [facility] Follow up on 07/16/2021.   Specialty: Cardiology Why: [facility] 9:30 am Entrance C, Garage Code 1202 Contact information: [address] [phone] [place]        [name], MD. Schedule an appointment as soon as possible for a visit in 2 week(s).   Specialty: Family Medicine Contact information: [address]  [place] [address] [phone]         Wellington Hampshire, MD .   Specialty: Cardiology Contact information: [place] [address] [phone]         Deboraha Sprang, MD .   Specialty: Cardiology Contact information: [address] [place] [phone] [phone]                 Discharge Exam: Danley Danker Weights   07/07/21 9480 07/08/21 0500 07/09/21 0630  Weight: 90.8 kg 90.6 kg 91.1 kg   S: No acute complaints, hoping to be discharged today  BP (!) 127/48 (BP Location: Right Wrist)    Pulse 69    Temp 98.8 F (37.1 C) (Oral)    Resp 18    Ht 5' (1.524 m)    Wt 91.1 kg    SpO2 98%    BMI 39.22 kg/m     Physical Exam General: Alert and oriented x 3, NAD Cardiovascular: S1 S2 clear, RRR. No pedal edema b/l Respiratory: CTAB, no wheezing, rales or rhonchi Gastrointestinal: Soft, nontender, nondistended, NBS Ext: no pedal edema bilaterally Neuro: no new deficits  Condition at discharge: fair  The results of significant diagnostics from this hospitalization (including imaging, microbiology, ancillary and laboratory) are listed below for reference.   Imaging Studies: CT ABDOMEN PELVIS WO CONTRAST  Result Date: 06/24/2021 CLINICAL DATA:  Abdominal pain, nonlocalized. Diarrhea loss of appetite for past few days. EXAM: CT ABDOMEN AND PELVIS WITHOUT CONTRAST TECHNIQUE:  Multidetector CT imaging of the abdomen and pelvis was performed following the standard protocol without IV contrast. COMPARISON:  CT examination dated October 15, 2020 FINDINGS: Lower chest: No acute abnormality. Hepatobiliary: No focal liver abnormality is seen. Status post cholecystectomy. No biliary dilatation. Pancreas: Unremarkable. No pancreatic ductal dilatation or surrounding inflammatory changes. Spleen: Normal in size without focal abnormality. Adrenals/Urinary Tract: Adrenal glands are unremarkable. Kidneys are normal, without renal calculi, focal lesion, or hydronephrosis. Bladder is unremarkable. Stomach/Bowel: Stomach is within normal limits. Appendix not visualized. No evidence of bowel wall thickening, distention, or inflammatory changes. Colonic diverticulosis without evidence of acute diverticulitis. Vascular/Lymphatic: Aortic atherosclerosis. No enlarged abdominal or pelvic lymph nodes. IVC filter is noted. Reproductive: Status post hysterectomy. Stable hypodense cystic structures in bilateral adnexal region. Other: No abdominal wall hernia or abnormality. No abdominopelvic ascites. Musculoskeletal: Degenerate disc disease of the lumbar spine with mild levoscoliosis. No acute osseous abnormality. IMPRESSION: 1. Sigmoid colonic diverticulosis without evidence of acute diverticulitis. 2. Bowel loops are normal in caliber without evidence of obstruction or significant wall thickening. 3. Status post cholecystectomy and hysterectomy. 4. 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 IVC filter is present. Electronically Signed   By: Keane Police D.O.   On: 06/24/2021 16:17   DG Chest Port 1 View  Result Date: 07/03/2021 CLINICAL DATA:  RIGHT PICC line inserted EXAM: PORTABLE CHEST 1 VIEW COMPARISON:  07/12/2020 FINDINGS: Interval placement RIGHT PICC line with tip at the RIGHT cavoatrial junction. LEFT-sided pacer noted. Stable cardiac silhouette. Mild pulmonary edema pattern.  IMPRESSION: 1. RIGHT PICC line insertion with tip at the cavoatrial junction. 2. Mild pulmonary edema pattern. Electronically Signed   By: Suzy Bouchard M.D.   On: 07/03/2021 11:54   DG Chest Portable 1 View  Result Date: 06/25/2021 CLINICAL DATA:  Chest and back pain with diarrhea. EXAM: PORTABLE CHEST 1 VIEW COMPARISON:  01/15/2021 FINDINGS: 0459 hours. Stable asymmetric elevation right hemidiaphragm. Diffuse interstitial opacity again  noted with new areas of patchy airspace disease in the right upper lobe and medial right base. No substantial pleural effusion. Left-sided permanent pacemaker again noted. Telemetry leads overlie the chest. IMPRESSION: Interval development of patchy airspace disease in the right upper lobe and medial right base. Multifocal pneumonia concern. Electronically Signed   By: Misty Stanley M.D.   On: 06/25/2021 05:23   ECHOCARDIOGRAM COMPLETE  Result Date: 06/27/2021    ECHOCARDIOGRAM REPORT   Patient Name:   Debra Saunders Date of Exam: 06/26/2021 Medical Rec #:  [id number]        Height:       67.0 in Accession #:    [id number]       Weight:       192.0 lb Date of Birth:  1946/05/22         BSA:          1.988 m Patient Age:    72 years         BP:           139/44 mmHg Patient Gender: F                HR:           87 bpm. Exam Location:  [facility] Procedure: 2D Echo, Cardiac Doppler and Color Doppler Indications:     R78.81 Bacteremia  History:         Patient has prior history of Echocardiogram examinations, most                  recent 09/12/2019. Cardiomyopathy, Stroke, Arrythmias:Atrial                  Fibrillation; Risk Factors:Hypertension, Dyslipidemia, Diabetes                  and Sleep Apnea.  Sonographer:     Cresenciano Lick RDCS Referring Phys:  [id number] Barb Merino Diagnosing Phys: Ida Rogue MD IMPRESSIONS  1. Unable to exclude valve endocarditis as detailed below.  2. Left ventricular ejection fraction, by estimation, is 60 to 65%. The left ventricle has  normal function. The left ventricle has no regional wall motion abnormalities. There is mild left ventricular hypertrophy. Left ventricular diastolic parameters are indeterminate.  3. Right ventricular systolic function is normal. The right ventricular size is normal. There is moderately elevated pulmonary artery systolic pressure. The estimated right ventricular systolic pressure is 44.6 mmHg.  4. The mitral valve is normal in structure. Mild to moderate mitral valve regurgitation. No evidence of mitral stenosis.  5. Tricuspid valve regurgitation is moderate. Thickening of the tricuspid leaflets, unable to exclude vegetation.  6. The aortic valve is normal in structure. There is moderate calcification of the aortic valve. There is moderate thickening of the aortic valve. Aortic valve regurgitation is not visualized. valve thickening appears worse compared to study in 2021, unable to exclude vegetation.  7. Aortic valve sclerosis/calcification is present, without any evidence of aortic stenosis.  8. The inferior vena cava is normal in size with greater than 50% respiratory variability, suggesting right atrial pressure of 3 mmHg. FINDINGS  Left Ventricle: Left ventricular ejection fraction, by estimation, is 60 to 65%. The left ventricle has normal function. The left ventricle has no regional wall motion abnormalities. The left ventricular internal cavity size was normal in size. There is  mild left ventricular hypertrophy. Left ventricular diastolic parameters are indeterminate. Right Ventricle: The right ventricular size is normal. No increase in right ventricular  wall thickness. Right ventricular systolic function is normal. There is moderately elevated pulmonary artery systolic pressure. The tricuspid regurgitant velocity is 3.41 m/s, and with an assumed right atrial pressure of 5 mmHg, the estimated right ventricular systolic pressure is 33.8 mmHg. Left Atrium: Left atrial size was normal in size. Right Atrium:  Right atrial size was normal in size. Pericardium: There is no evidence of pericardial effusion. Mitral Valve: The mitral valve is normal in structure. Mild to moderate mitral valve regurgitation. No evidence of mitral valve stenosis. Tricuspid Valve: The tricuspid valve is normal in structure. Tricuspid valve regurgitation is moderate . No evidence of tricuspid stenosis. Aortic Valve: The aortic valve is normal in structure. There is moderate calcification of the aortic valve. There is moderate thickening of the aortic valve. Aortic valve regurgitation is not visualized. Aortic valve sclerosis/calcification is present, without any evidence of aortic stenosis. Aortic valve mean gradient measures 10.5 mmHg. Aortic valve peak gradient measures 18.4 mmHg. Aortic valve area, by VTI measures 1.73 cm. Pulmonic Valve: The pulmonic valve was normal in structure. Pulmonic valve regurgitation is mild. No evidence of pulmonic stenosis. Aorta: The aortic root is normal in size and structure. Venous: The inferior vena cava is normal in size with greater than 50% respiratory variability, suggesting right atrial pressure of 3 mmHg. IAS/Shunts: No atrial level shunt detected by color flow Doppler. Additional Comments: A device lead is visualized.  LEFT VENTRICLE PLAX 2D LVIDd:         5.00 cm   Diastology LVIDs:         2.80 cm   LV e' medial:    9.25 cm/s LV PW:         1.20 cm   LV E/e' medial:  14.9 LV IVS:        1.20 cm   LV e' lateral:   7.83 cm/s LVOT diam:     1.90 cm   LV E/e' lateral: 17.6 LV SV:         62 LV SV Index:   31 LVOT Area:     2.84 cm  RIGHT VENTRICLE             IVC RV Basal diam:  3.80 cm     IVC diam: 1.80 cm RV S prime:     18.47 cm/s TAPSE (M-mode): 2.2 cm LEFT ATRIUM             Index        RIGHT ATRIUM           Index LA diam:        4.00 cm 2.01 cm/m   RA Area:     17.40 cm LA Vol (A2C):   75.5 ml 37.99 ml/m  RA Volume:   48.20 ml  24.25 ml/m LA Vol (A4C):   79.3 ml 39.90 ml/m LA Biplane Vol:  78.5 ml 39.50 ml/m  AORTIC VALVE AV Area (Vmax):    1.71 cm AV Area (Vmean):   1.72 cm AV Area (VTI):     1.73 cm AV Vmax:           214.50 cm/s AV Vmean:          152.250 cm/s AV VTI:            0.356 m AV Peak Grad:      18.4 mmHg AV Mean Grad:      10.5 mmHg LVOT Vmax:         129.00 cm/s LVOT Vmean:  92.350 cm/s LVOT VTI:          0.217 m LVOT/AV VTI ratio: 0.61  AORTA Ao Root diam: 2.80 cm Ao Asc diam:  3.50 cm MV E velocity: 137.67 cm/s  TRICUSPID VALVE                             TR Peak grad:   46.5 mmHg                             TR Vmax:        341.00 cm/s                              SHUNTS                             Systemic VTI:  0.22 m                             Systemic Diam: 1.90 cm Ida Rogue MD Electronically signed by Ida Rogue MD Signature Date/Time: 06/27/2021/10:07:07 AM    Final    ECHO TEE  Result Date: 07/04/2021    TRANSESOPHOGEAL ECHO REPORT   Patient Name:   Debra Saunders Date of Exam: 07/04/2021 Medical Rec #:  [id number]        Height:       60.0 in Accession #:    [id number]       Weight:       200.4 lb Date of Birth:  19-Aug-1945         BSA:          1.868 m Patient Age:    68 years         BP:           108/43 mmHg Patient Gender: F                HR:           59 bpm. Exam Location:  Inpatient Procedure: Transesophageal Echo, Color Doppler and Cardiac Doppler Indications:     I48.91* Unspecified atrial fibrillation  History:         Patient has prior history of Echocardiogram examinations, most                  recent 06/28/2021. CHF, Pulmonary HTN, Arrythmias:Atrial                  Fibrillation; Risk Factors:Hypertension, Diabetes, Dyslipidemia                  and Sleep Apnea.  Sonographer:     [name] Sarna Senior RDCS Referring Phys:  [place] Diagnosing Phys: Franki Monte PROCEDURE: After discussion of the risks and benefits of a TEE, an informed consent was obtained from the patient. The transesophogeal probe was passed without difficulty through  the esophogus of the patient. Sedation performed by different physician. The patient was monitored while under deep sedation. Anesthestetic sedation was provided intravenously by Anesthesiology: 31m of Propofol. The patient developed no complications during the procedure. A successful direct current cardioversion was performed at 200 joules with 1 attempt. IMPRESSIONS  1. Left ventricular ejection fraction, by estimation, is 60 to 65%. The left ventricle has normal function.  There is mild left ventricular hypertrophy.  2. Peak RV-RA gradient 30 mmHg. 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 Right ventricular systolic function is normal. The right ventricular size is mildly enlarged.  3. Left atrial size was mildly dilated. No left atrial/left atrial appendage thrombus was detected.  4. Right atrial size was moderately dilated.  5. The mitral valve is normal in structure. Mild to moderate mitral valve regurgitation. No evidence of mitral stenosis.  6. There is mobile vegetation on the tricuspid valve. The tricuspid valve is abnormal. Tricuspid valve regurgitation is moderate.  7. The aortic valve is tricuspid. Aortic valve regurgitation is not visualized. Aortic valve sclerosis/calcification is present, without any evidence of aortic stenosis.  8. Grade 3 plaque in the descending thoracic aorta.  9. No PFO or ASD by color doppler. FINDINGS  Left Ventricle: Left ventricular ejection fraction, by estimation, is 60 to 65%. The left ventricle has normal function. The left ventricular internal cavity size was normal in size. There is mild left ventricular hypertrophy. Right Ventricle: Peak RV-RA gradient 30 mmHg. 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 The right ventricular size is mildly enlarged. No increase in right ventricular wall thickness. Right ventricular systolic function  is normal. Left Atrium: Left atrial size was mildly dilated. No  left atrial/left atrial appendage thrombus was detected. Right Atrium: Right atrial size was moderately dilated. Pericardium: There is no evidence of pericardial effusion. Mitral Valve: The mitral valve is normal in structure. Mild to moderate mitral valve regurgitation. No evidence of mitral valve stenosis. Tricuspid Valve: There is mobile vegetation on the tricuspid valve. The tricuspid valve is abnormal. Tricuspid valve regurgitation is moderate. Aortic Valve: The aortic valve is tricuspid. Aortic valve regurgitation is not visualized. Aortic valve sclerosis/calcification is present, without any evidence of aortic stenosis. Pulmonic Valve: The pulmonic valve was normal in structure. Pulmonic valve regurgitation is not visualized. Aorta: Grade 3 plaque in the descending thoracic aorta. The aortic root is normal in size and structure. IAS/Shunts: No PFO or ASD by color doppler. Additional Comments: A device lead is visualized in the right ventricle. [employer] Electronically signed by Franki Monte Signature Date/Time: 07/04/2021/3:51:43 PM    Final    ECHO TEE  Result Date: 06/28/2021    TRANSESOPHOGEAL ECHO REPORT   Patient Name:   Debra Saunders Date of Exam: 06/28/2021 Medical Rec #:  [id number]        Height:       67.0 in Accession #:    [id number]       Weight:       192.0 lb Date of Birth:  1945/11/02         BSA:          1.988 m Patient Age:    11 years         BP:           119/44 mmHg Patient Gender: F                HR:           71 bpm. Exam Location:  [facility] Procedure: Transesophageal Echo, Cardiac Doppler, Color Doppler and Saline            Contrast Bubble Study Indications:     Bacteremia R78.81  History:         Patient has prior history of Echocardiogram examinations, most  recent 06/26/2021. Pacemaker, Arrythmias:Atrial Fibrillation;                  Risk Factors:Hypertension.  Sonographer:     Sherrie Sport Referring Phys:  [id number] [name] Diagnosing Phys: Ida Rogue MD  PROCEDURE: After discussion of the risks and benefits of a TEE, an informed consent was obtained from the patient. TEE procedure time was 30 minutes. The transesophogeal probe was passed without difficulty through the esophogus of the patient. Imaged were obtained with the patient in a left lateral decubitus position. Local oropharyngeal anesthetic was provided with Benzocaine spray and Cetacaine. Sedation performed by performing physician. Patients was under conscious sedation during this procedure.  Anesthetic administered: 73mg of Fentanyl, 364mof Versed. Image quality was excellent. The patient's vital signs; including heart rate, blood pressure, and oxygen saturation; remained stable throughout the procedure. The patient developed no complications during the procedure. IMPRESSIONS  1. Large mobile vegetation on the RV pacing wire noted in the right atrium, with estimated to be 1 x 1.5 cm in size or larger.  2. Left ventricular ejection fraction, by estimation, is 55 to 60%. The left ventricle has normal function. The left ventricle has no regional wall motion abnormalities. There is mild left ventricular hypertrophy.  3. Right ventricular systolic function is normal. The right ventricular size is normal.  4. No left atrial/left atrial appendage thrombus was detected.  5. The mitral valve is normal in structure. Mild to moderate mitral valve regurgitation. No evidence of mitral stenosis.  6. The aortic valve is normal in structure. There is moderate calcification of the aortic valve. Aortic valve regurgitation is not visualized. No aortic stenosis is present.  7. There is mild (Grade II) atheroma plaque involving the aortic arch and descending aorta.  8. Agitated saline contrast bubble study was negative, with no evidence of any interatrial shunt. Conclusion(s)/Recommendation(s): Normal biventricular function without evidence of hemodynamically significant valvular heart disease. FINDINGS  Left Ventricle: Left  ventricular ejection fraction, by estimation, is 55 to 60%. The left ventricle has normal function. The left ventricle has no regional wall motion abnormalities. The left ventricular internal cavity size was normal in size. There is  mild left ventricular hypertrophy. Right Ventricle: The right ventricular size is normal. No increase in right ventricular wall thickness. Right ventricular systolic function is normal. Left Atrium: Left atrial size was normal in size. No left atrial/left atrial appendage thrombus was detected. Right Atrium: Right atrial size was normal in size. Pericardium: There is no evidence of pericardial effusion. Mitral Valve: The mitral valve is normal in structure. Mild to moderate mitral valve regurgitation. No evidence of mitral valve stenosis. Tricuspid Valve: The tricuspid valve is normal in structure. Tricuspid valve regurgitation is not demonstrated. No evidence of tricuspid stenosis. Aortic Valve: The aortic valve is normal in structure. There is moderate calcification of the aortic valve. Aortic valve regurgitation is not visualized. No aortic stenosis is present. There is no evidence of aortic valve vegetation. Pulmonic Valve: The pulmonic valve was normal in structure. Pulmonic valve regurgitation is not visualized. No evidence of pulmonic stenosis. Aorta: The aortic root is normal in size and structure. There is mild (Grade II) atheroma plaque involving the aortic arch and descending aorta. IAS/Shunts: No atrial level shunt detected by color flow Doppler. Agitated saline contrast was given intravenously to evaluate for intracardiac shunting. Agitated saline contrast bubble study was negative, with no evidence of any interatrial shunt. Additional Comments: A device lead is visualized. TiIda Rogue  MD Electronically signed by Ida Rogue MD Signature Date/Time: 06/28/2021/5:38:15 PM    Final (Updated)    [place] EKG SITE RITE  Result Date: 07/02/2021 If Site Rite image not attached,  placement could not be confirmed due to current cardiac rhythm.  US Abdomen Limited RUQ (LIVER/GB)  Result Date: 06/26/2021 CLINICAL DATA:  Right upper quadrant pain. EXAM: ULTRASOUND ABDOMEN LIMITED RIGHT UPPER QUADRANT COMPARISON:  CT abdomen/pelvis 06/24/2021. FINDINGS: Gallbladder: Cholecystectomy. Common bile duct: Diameter: 5 mm, within normal limits. Liver: No focal lesion identified. Within normal limits in parenchymal echogenicity. Portal vein is patent on color Doppler imaging with normal direction of blood flow towards the liver. IMPRESSION: Cholecystectomy.  No evidence of acute abnormality. Electronically Signed   By: Margaretha Sheffield M.D.   On: 06/26/2021 09:28    Microbiology: Results for orders placed or performed during the hospital encounter of 06/25/21  Blood culture (routine x 2)     Status: Abnormal   Collection Time: 06/25/21  2:33 AM   Specimen: BLOOD RIGHT FOREARM  Result Value Ref Range Status   Specimen Description   Final    BLOOD RIGHT FOREARM Performed at [facility], [address] [place], [place] [address]    Special Requests   Final    BOTTLES DRAWN AEROBIC AND ANAEROBIC Blood Culture results may not be optimal due to an inadequate volume of blood received in culture bottles Performed at [facility], [address] [place], [place] [address]    Culture  Setup Time   Final    GRAM POSITIVE COCCI IN BOTH AEROBIC AND ANAEROBIC BOTTLES Organism ID to follow CRITICAL RESULT CALLED TO, READ BACK BY AND VERIFIED WITHArdeen Garland PHARMD [id number] 06/25/21 HNM Performed at [facility], [address] [place],  [address]    Culture STREPTOCOCCUS GROUP G (A)  Final   Report Status 06/28/2021 FINAL  Final   Organism ID, Bacteria STREPTOCOCCUS GROUP G  Final      Susceptibility   Streptococcus group g - MIC*    CLINDAMYCIN RESISTANT Resistant     AMPICILLIN <=0.25 SENSITIVE Sensitive     ERYTHROMYCIN 2 RESISTANT Resistant      VANCOMYCIN 0.5 SENSITIVE Sensitive     CEFTRIAXONE <=0.12 SENSITIVE Sensitive     LEVOFLOXACIN 0.5 SENSITIVE Sensitive     PENICILLIN Value in next row Sensitive      SENSITIVE0.06    * STREPTOCOCCUS GROUP G  Blood Culture ID Panel (Reflexed)     Status: Abnormal   Collection Time: 06/25/21  2:33 AM  Result Value Ref Range Status   Enterococcus faecalis NOT DETECTED NOT DETECTED Final   Enterococcus Faecium NOT DETECTED NOT DETECTED Final   Listeria monocytogenes NOT DETECTED NOT DETECTED Final   Staphylococcus species NOT DETECTED NOT DETECTED Final   Staphylococcus aureus (BCID) NOT DETECTED NOT DETECTED Final   Staphylococcus epidermidis NOT DETECTED NOT DETECTED Final   Staphylococcus lugdunensis NOT DETECTED NOT DETECTED Final   Streptococcus species DETECTED (A) NOT DETECTED Final    Comment: Not Enterococcus species, Streptococcus agalactiae, Streptococcus pyogenes, or Streptococcus pneumoniae. CRITICAL RESULT CALLED TO, READ BACK BY AND VERIFIED WITH: MORGAN HICKS SUPJSR [id number] 06/25/21 HNM    Streptococcus agalactiae NOT DETECTED NOT DETECTED Final   Streptococcus pneumoniae NOT DETECTED NOT DETECTED Final   Streptococcus pyogenes NOT DETECTED NOT DETECTED Final   A.calcoaceticus-baumannii NOT DETECTED NOT DETECTED Final   Bacteroides fragilis NOT DETECTED NOT DETECTED Final   Enterobacterales NOT DETECTED NOT DETECTED  Final   Enterobacter cloacae complex NOT DETECTED NOT DETECTED Final   Escherichia coli NOT DETECTED NOT DETECTED Final   Klebsiella aerogenes NOT DETECTED NOT DETECTED Final   Klebsiella oxytoca NOT DETECTED NOT DETECTED Final   Klebsiella pneumoniae NOT DETECTED NOT DETECTED Final   Proteus species NOT DETECTED NOT DETECTED Final   Salmonella species NOT DETECTED NOT DETECTED Final   Serratia marcescens NOT DETECTED NOT DETECTED Final   Haemophilus influenzae NOT DETECTED NOT DETECTED Final   Neisseria meningitidis NOT DETECTED NOT DETECTED Final    Pseudomonas aeruginosa NOT DETECTED NOT DETECTED Final   Stenotrophomonas maltophilia NOT DETECTED NOT DETECTED Final   Candida albicans NOT DETECTED NOT DETECTED Final   Candida auris NOT DETECTED NOT DETECTED Final   Candida glabrata NOT DETECTED NOT DETECTED Final   Candida krusei NOT DETECTED NOT DETECTED Final   Candida parapsilosis NOT DETECTED NOT DETECTED Final   Candida tropicalis NOT DETECTED NOT DETECTED Final   Cryptococcus neoformans/gattii NOT DETECTED NOT DETECTED Final    Comment: Performed at [facility], [address] [place], [place] [address]  Resp Panel by RT-PCR (Flu A&B, Covid) Nasopharyngeal Swab     Status: None   Collection Time: 06/25/21  3:33 AM   Specimen: Nasopharyngeal Swab; Nasopharyngeal(NP) swabs in vial transport medium  Result Value Ref Range Status   SARS Coronavirus 2 by RT PCR NEGATIVE NEGATIVE Final    Comment: (NOTE) SARS-CoV-2 target nucleic acids are NOT DETECTED.  The SARS-CoV-2 RNA is generally detectable in upper respiratory specimens during the acute phase of infection. The lowest concentration of SARS-CoV-2 viral copies this assay can detect is 138 copies/mL. A negative result does not preclude SARS-Cov-2 infection and should not be used as the sole basis for treatment or other patient management decisions. A negative result may occur with  improper specimen collection/handling, submission of specimen other than nasopharyngeal swab, presence of viral mutation(s) within the areas targeted by this assay, and inadequate number of viral copies(<138 copies/mL). A negative result must be combined with clinical observations, patient history, and epidemiological information. The expected result is Negative.  Fact Sheet for Patients:  EntrepreneurPulse.com.au  Fact Sheet for Healthcare Providers:  IncredibleEmployment.be  This test is no t yet approved or cleared by the [place] FDA and   has been authorized for detection and/or diagnosis of SARS-CoV-2 by FDA under an Emergency Use Authorization (EUA). This EUA will remain  in effect (meaning this test can be used) for the duration of the COVID-19 declaration under Section 564(b)(1) of the Act, 21 U.S.C.section 360bbb-3(b)(1), unless the authorization is terminated  or revoked sooner.       Influenza A by PCR NEGATIVE NEGATIVE Final   Influenza B by PCR NEGATIVE NEGATIVE Final    Comment: (NOTE) The Xpert Xpress SARS-CoV-2/FLU/RSV plus assay is intended as an aid in the diagnosis of influenza from Nasopharyngeal swab specimens and should not be used as a sole basis for treatment. Nasal washings and aspirates are unacceptable for Xpert Xpress SARS-CoV-2/FLU/RSV testing.  Fact Sheet for Patients: EntrepreneurPulse.com.au  Fact Sheet for Healthcare Providers: IncredibleEmployment.be  This test is not yet approved or cleared by the [place] FDA and has been authorized for detection and/or diagnosis of SARS-CoV-2 by FDA under an Emergency Use Authorization (EUA). This EUA will remain in effect (meaning this test can be used) for the duration of the COVID-19 declaration under Section 564(b)(1) of the Act, 21 U.S.C. section 360bbb-3(b)(1), unless the authorization is terminated or  revoked.  Performed at [facility], [address] [place], [place] [address]   Blood culture (routine x 2)     Status: Abnormal   Collection Time: 06/25/21  5:55 AM   Specimen: BLOOD  Result Value Ref Range Status   Specimen Description   Final    BLOOD LEFT ARM Performed at [facility], [address], [place], [place] [address]    Special Requests   Final    BOTTLES DRAWN AEROBIC AND ANAEROBIC Blood Culture adequate volume Performed at [facility], [address], [place], [place] [address]    Culture  Setup Time   Final    GRAM POSITIVE COCCI IN BOTH  AEROBIC AND ANAEROBIC BOTTLES CRITICAL VALUE NOTED.  VALUE IS CONSISTENT WITH PREVIOUSLY REPORTED AND CALLED VALUE. Performed at [facility], [address] [place], [place] [address]    Culture (A)  Final    STREPTOCOCCUS GROUP G SUSCEPTIBILITIES PERFORMED ON PREVIOUS CULTURE WITHIN THE LAST 5 DAYS. Performed at [facility], [place] [address], [place], [place] [address]    Report Status 06/28/2021 FINAL  Final  Urine Culture     Status: Abnormal   Collection Time: 06/25/21  6:55 AM   Specimen: Urine, Clean Catch  Result Value Ref Range Status   Specimen Description   Final    URINE, CLEAN CATCH Performed at [facility], [address] [place], [place] [address]    Special Requests   Final    NONE Performed at [facility], [place], [place] [address]    Culture >=100,000 COLONIES/mL KLEBSIELLA PNEUMONIAE (A)  Final   Report Status 06/27/2021 FINAL  Final   Organism ID, Bacteria KLEBSIELLA PNEUMONIAE (A)  Final      Susceptibility   Klebsiella pneumoniae - MIC*    AMPICILLIN RESISTANT Resistant     CEFAZOLIN <=4 SENSITIVE Sensitive     CEFEPIME <=0.12 SENSITIVE Sensitive     CEFTRIAXONE <=0.25 SENSITIVE Sensitive     CIPROFLOXACIN <=0.25 SENSITIVE Sensitive     GENTAMICIN <=1 SENSITIVE Sensitive     IMIPENEM <=0.25 SENSITIVE Sensitive     NITROFURANTOIN 32 SENSITIVE Sensitive     TRIMETH/SULFA <=20 SENSITIVE Sensitive     AMPICILLIN/SULBACTAM <=2 SENSITIVE Sensitive     PIP/TAZO <=4 SENSITIVE Sensitive     * >=100,000 COLONIES/mL KLEBSIELLA PNEUMONIAE  C Difficile Quick Screen w PCR reflex     Status: None   Collection Time: 06/25/21  6:06 PM   Specimen: Stool  Result Value Ref Range Status   C Diff antigen NEGATIVE NEGATIVE Final   C Diff toxin NEGATIVE NEGATIVE Final   C Diff interpretation No C. difficile detected.  Final    Comment: Performed at [facility], [address] [place], [place] [address]   MRSA Next Gen by PCR, Nasal     Status: None   Collection Time: 06/25/21 10:55 PM   Specimen: Nasal Mucosa; Nasal Swab  Result Value Ref Range Status   MRSA by PCR Next Gen NOT DETECTED NOT DETECTED Final    Comment: (NOTE) [name] (FDA approved for NASAL specimens only), is one component of a comprehensive MRSA colonization surveillance program. It is not intended to diagnose MRSA infection nor to guide or monitor treatment for MRSA infections. Test performance is not FDA approved in patients less than 76 years old. Performed at [facility], [address] [place], [place] [address]   Culture,  blood (Routine X 2) w Reflex to ID Panel     Status: None   Collection Time: 06/27/21  8:29 AM   Specimen: BLOOD RIGHT HAND  Result Value Ref Range Status   Specimen Description BLOOD RIGHT HAND  Final   Special Requests   Final    BOTTLES DRAWN AEROBIC AND ANAEROBIC Blood Culture adequate volume   Culture   Final    NO GROWTH 5 DAYS Performed at [facility], [address], [place], [place] [address]    Report Status 07/02/2021 FINAL  Final  Culture, blood (Routine X 2) w Reflex to ID Panel     Status: None   Collection Time: 06/27/21  8:29 AM   Specimen: BLOOD RIGHT HAND  Result Value Ref Range Status   Specimen Description BLOOD RIGHT HAND  Final   Special Requests   Final    BOTTLES DRAWN AEROBIC ONLY Blood Culture adequate volume   Culture   Final    NO GROWTH 5 DAYS Performed at [facility], [address] [place], [place] [address]    Report Status 07/02/2021 FINAL  Final   *Note: Due to a large number of results and/or encounters for the requested time period, some results have not been displayed. A complete set of results can be found in Results Review.    Labs: CBC: Recent Labs  Lab 07/05/21 1114 07/06/21 0500 07/07/21 0110 07/08/21 0355 07/09/21 0345  WBC 12.2* 14.0* 13.4* 12.4* 12.9*  HGB 6.8* 8.7* 8.2* 8.1*  7.7*  HCT 22.3* 27.1* 26.2* 25.1* 24.4*  MCV 86.8 86.6 86.5 87.5 88.1  PLT 151 147* 152 159 628   Basic Metabolic Panel: Recent Labs  Lab 07/03/21 0051 07/04/21 0531 07/06/21 0500 07/06/21 1209 07/07/21 0110 07/08/21 0355 07/09/21 0345  NA 131*   < > 126* 133* 133* 131* 130*  K 4.2   < > 5.3* 3.8 3.7 3.7 4.1  CL 92*   < > 87* 94* 95* 95* 94*  CO2 32   < > _0 GLUCOSE 191*   < > 377* 175* 123* 104* 125*  BUN 14   < > _1 CREATININE 1.27*   < > 1.23* 1.24* 1.28* 1.37* 1.54*  CALCIUM 7.9*   < > 7.2* 7.9* 7.8* 7.8* 7.9*  MG 2.1  --   --   --   --   --   --    < > = values in this interval not displayed.   Liver Function Tests: No results for input(s): AST, ALT, ALKPHOS, BILITOT, PROT, ALBUMIN in the last 168 hours. CBG: Recent Labs  Lab 07/08/21 1117 07/08/21 1628 07/08/21 2104 07/09/21 0626 07/09/21 1137  GLUCAP 102* 147* 189* 124* 125*    Discharge time spent: greater than 30 minutes.  Signed: Estill Cotta, MD [facility] 07/09/2021

## 2021-07-09 NOTE — Progress Notes (Signed)
Patient ID: Debra Saunders, female   DOB: 10-08-1945, 76 y.o.   MRN: [id number]    Advanced Heart Failure Rounding Note   Subjective:    Bld cultures 1/5---> No growth.   1/9- EP consulted. Not a candidate for extraction d/t comorbid conditions.  1/11 PICC placed.  1/12 S/P TEE DC/CV with restoration of SR.   Creatinine 1.37 => 1.5.  Na 130  Denies dyspnea or chest pain but has not been out of bed today. Afebrile, remains on PCN G.   Objective:     Vital Signs:   Temp:  [98.7 F (37.1 C)-99.8 F (37.7 C)] 98.7 F (37.1 C) (01/17 0732) Pulse Rate:  [61-82] 69 (01/17 0334) Resp:  [16-22] 18 (01/17 0334) BP: (97-127)/(48-75) 127/48 (01/17 0334) SpO2:  [92 %-98 %] 98 % (01/17 0334) Weight:  [91.1 kg] 91.1 kg (01/17 0630) Last BM Date: 07/08/21  Weight change: Filed Weights   07/07/21 0624 07/08/21 0500 07/09/21 0630  Weight: 90.8 kg 90.6 kg 91.1 kg    Intake/Output:   Intake/Output Summary (Last 24 hours) at 07/09/2021 0907 Last data filed at 07/09/2021 3419 Gross per 24 hour  Intake 1462.74 ml  Output 700 ml  Net 762.74 ml     Physical Exam: General: NAD Neck: JVP 7-8 cm, no thyromegaly or thyroid nodule.  Lungs: Clear to auscultation bilaterally with normal respiratory effort. CV: Nondisplaced PMI.  Heart regular S1/S2, no S3/S4, 2/6 HSM LLSB.  No peripheral edema.   Abdomen: Soft, nontender, no hepatosplenomegaly, no distention.  Skin: Intact without lesions or rashes.  Neurologic: Alert and oriented x 3.  Psych: Normal affect. Extremities: No clubbing or cyanosis.  HEENT: Normal.    Telemetry: AV paced 60s Personally reviewed  Labs: Basic Metabolic Panel: Recent Labs  Lab 07/03/21 0051 07/04/21 0531 07/06/21 0500 07/06/21 1209 07/07/21 0110 07/08/21 0355 07/09/21 0345  NA 131*   < > 126* 133* 133* 131* 130*  K 4.2   < > 5.3* 3.8 3.7 3.7 4.1  CL 92*   < > 87* 94* 95* 95* 94*  CO2 32   < > 28 30 30 31 30   GLUCOSE 191*   < > 377* 175* 123*  104* 125*  BUN 14   < > 12 13 12 14 16   CREATININE 1.27*   < > 1.23* 1.24* 1.28* 1.37* 1.54*  CALCIUM 7.9*   < > 7.2* 7.9* 7.8* 7.8* 7.9*  MG 2.1  --   --   --   --   --   --    < > = values in this interval not displayed.    Liver Function Tests: No results for input(s): AST, ALT, ALKPHOS, BILITOT, PROT, ALBUMIN in the last 168 hours.  No results for input(s): LIPASE, AMYLASE in the last 168 hours.  No results for input(s): AMMONIA in the last 168 hours.  CBC: Recent Labs  Lab 07/05/21 1114 07/06/21 0500 07/07/21 0110 07/08/21 0355 07/09/21 0345  WBC 12.2* 14.0* 13.4* 12.4* 12.9*  HGB 6.8* 8.7* 8.2* 8.1* 7.7*  HCT 22.3* 27.1* 26.2* 25.1* 24.4*  MCV 86.8 86.6 86.5 87.5 88.1  PLT 151 147* 152 159 170    Cardiac Enzymes: No results for input(s): CKTOTAL, CKMB, CKMBINDEX, TROPONINI in the last 168 hours.  BNP: BNP (last 3 results) Recent Labs    08/24/20 1506 03/27/21 1530 06/25/21 1350  BNP 51.2 31.1 344.7*    ProBNP (last 3 results) No results for input(s): PROBNP in the last  8760 hours.    Other results:  Imaging: No results found.   Medications:     Scheduled Medications:  sodium chloride   Intravenous Once   amiodarone  200 mg Oral BID   apixaban  5 mg Oral BID   benzonatate  200 mg Oral TID   Chlorhexidine Gluconate Cloth  6 each Topical Daily   cyclopentolate  1 drop Both Eyes QHS   Difluprednate  1 drop Both Eyes BID   digoxin  0.125 mg Oral Daily   DULoxetine  60 mg Oral Daily   gabapentin  600 mg Oral TID   insulin aspart  0-9 Units Subcutaneous TID WC   macitentan  10 mg Oral Daily   pantoprazole (PROTONIX) IV  40 mg Intravenous Q24H   polyethylene glycol  17 g Oral BID   rosuvastatin  10 mg Oral Daily   Selexipag  1,600 mcg Oral BID   sildenafil  20 mg Oral TID   sodium chloride flush  10-40 mL Intracatheter Q12H   sodium chloride flush  3 mL Intravenous Q12H   torsemide  20 mg Oral QPM   torsemide  40 mg Oral Q breakfast     Infusions:  sodium chloride     penicillin g continuous IV infusion 8 Million Units (07/09/21 1062)    PRN Medications: sodium chloride, acetaminophen **OR** acetaminophen, albuterol, cholestyramine light, diphenhydrAMINE, EPINEPHrine, guaiFENesin-codeine, hydrALAZINE, HYDROcodone-acetaminophen, morphine injection, phenol, sodium chloride flush   Assessment/Plan:   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  Group G strep bacteremia.  Septic emboli to lungs. Seen by EP, thought to be too high risk for device extraction. Unfortunately she will be at high risk for complications going forward.  - ID following.  - She will need PCN G IV at home x 6 wks to 08/08/20.  Has PICC.  2. Acute on chronic hypoxemic respiratory failure: She developed PNA from septic emboli to lungs.  At baseline, on 4L home oxygen with CPAP at night for interstitial lung disease and OSA.   - Antibiotics as above for PNA.  - She is down to 4 L by [place] (baseline).  3. Pulmonary hypertension: Based on 4/19 cath, she had severe PAH with PVR 7.2 WU. Chronic hypoxemic respiratory failure, on home oxygen.  She has a history of remote DVT (2004) but V/Q scan negative for evidence of chronic PE.  High resolution CT chest in 10/19 showed pulmonary fibrosis, and PFTs in 2017 showed severe restriction.  Serological workup for rheumatological diseases was unremarkable.  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 Amiodarone was stopped in past given lung parenchymal disease. 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  - She is back on sildenafil 20 tid and macitentan 10 daily.  - Selexipag increased back to home dose 1600 bid.   - Continue digoxin for RV support, need to check level.  - Home oxygen, CPAP.  4. Atrial fibrillation with tachy-brady syndrome:  Had been off  amiodarone since 2019 due to interstitial lung disease/pulmonary fibrosis. Amiodarone restarted, now s/p TEE-guided DCCV this admission. A-V paced this morning.  - Decrease amiodarone to 200 mg daily at discharge.  - Continue Eliquis 5 mg bid.   5. AKI: Creatinine higher today at 1.5, lower the torsemide dose as below.   6. 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  Volume status looks ok today.  - Decrease torsemide to 40 qam/20 qpm with rising creatinine.   7. HTN: BP controlled.  Now with orthostatic symptoms.  - Use ted hose.  8. H/o DVT: Anticoagulated.  9. Left leg lymphedema: Chronic.  10. OSA: CPAP.  11. Hyponatremia: Stable, 130 today.  - Fluid restrict.  12. Anemia:  FOBT negative 01/12.  Hgb down to 6.8 on 01/13. Received 2 U PRBCs. Hgb 7.7 today.   No overt bleeding.  - Transfuse hgb < 7 - Check Fe studies.   She will need SNF for rehab. Awaiting placement. Cardiac meds for discharge: continue selexipag/sildenafil/macitentan as prior to admission, Eliquis 5 mg bid, amiodarone 200 mg daily, torsemide 40 qam/20 qpm, digoxin 0.125 daily, Crestor 10 daily.    [name] 07/09/2021 9:07 AM

## 2021-07-09 NOTE — TOC Progression Note (Addendum)
Transition of Care [facility]) - Progression Note    Patient Details  Name: Debra Saunders MRN: [id number] Date of Birth: Aug 17, 1945  Transition of Care [facility]) CM/SW Contact  Brinn Westby, LCSW Phone Number: 07/09/2021, 10:08 AM  Clinical Narrative:    HF CSW spoke with Debra Saunders at [facility] [phone] to follow up regarding the insurance authorization for SNF. Debra Saunders reported that she had to cancel the authorization from last week and re-start it today as yesterday was a holiday. CSW sent over therapy notes in the hub and Debra Saunders reported that she will continue to follow up with the CSW.  Thursday 07/04/21 - patient was not medically ready for discharge. Insurance authorization had been started by the facility but the facility was told by [facility] that because the patient wasn't medically ready the insurance authorization would be sent to [facility] and probably denied. So the facility cancelled the insurance authorization initiated on 07/05/21. [employer] was closed Monday for the [employer] holiday and insurance authorization re-started today 07/09/21.  3:54pm- HF CSW received a call from [place] at [place] that the insurance authorization came through but that it is too late to discharge today and will need to be tomorrow. Debra Saunders reported she will need a COVID test tomorrow same day as discharge. CSW spoke with [name] at bedside about the insurance getting approved for [employer] and plan for discharge tomorrow. 4:08pm - CSW spoke with the patient's daughter Debra Saunders to update her about the insurance coming through and that her mom will discharge tomorrow and the CSW will be in touch tomorrow as well.  CSW will continue to follow throughout discharge.   Expected Discharge Plan: Skilled Nursing Facility Barriers to Discharge: Continued Medical Work up  Expected Discharge Plan and Services Expected Discharge Plan: [place] In-house Referral: Clinical Social Work Discharge Planning  Services: CM Consult Post Acute Care Choice: [place] arrangements for the past 2 months: Single Family Home                                       Social Determinants of Health (SDOH) Interventions Food Insecurity Interventions: Intervention Not Indicated Financial Strain Interventions: Intervention Not Indicated Housing Interventions: Intervention Not Indicated Transportation Interventions: [employer], Other (Comment) (family and friends also help provide transportation)  Readmission Risk Interventions No flowsheet data found.

## 2021-07-09 NOTE — Progress Notes (Signed)
Mobility Specialist Progress Note    07/09/21 1059  Mobility  Bed Position Chair  Activity Ambulated with assistance in hallway  Level of Assistance Contact guard assist, steadying assist  Assistive Device Four wheel walker  Distance Ambulated (ft) 140 ft  Activity Response Tolerated fair  $Mobility charge 1 Mobility   Pt received in bed and agreeable. Had successful void on BSC and attempted BM but did not complete it. Took x1 seated rest break during walk. Ambulated on 4LO2. Returned to chair with call bell in reach.   [facility] Mobility Specialist  M.S. 2C and 6E: [phone] M.S. 4E: [phone]

## 2021-07-09 NOTE — Progress Notes (Signed)
Order received for STAT dig level - unable to get blood return from PICC to obtain sample - IV team consult placed - lab also notified to draw stat level. Primary RN, Sarah aware.

## 2021-07-10 ENCOUNTER — Other Ambulatory Visit: Payer: Self-pay

## 2021-07-10 DIAGNOSIS — I110 Hypertensive heart disease with heart failure: Secondary | ICD-10-CM

## 2021-07-10 DIAGNOSIS — Z86718 Personal history of other venous thrombosis and embolism: Secondary | ICD-10-CM

## 2021-07-10 DIAGNOSIS — I10 Essential (primary) hypertension: Secondary | ICD-10-CM

## 2021-07-10 DIAGNOSIS — B955 Unspecified streptococcus as the cause of diseases classified elsewhere: Secondary | ICD-10-CM

## 2021-07-10 DIAGNOSIS — R109 Unspecified abdominal pain: Secondary | ICD-10-CM

## 2021-07-10 DIAGNOSIS — R7881 Bacteremia: Secondary | ICD-10-CM

## 2021-07-10 DIAGNOSIS — N1831 Chronic kidney disease, stage 3a: Secondary | ICD-10-CM

## 2021-07-10 DIAGNOSIS — I495 Sick sinus syndrome: Secondary | ICD-10-CM

## 2021-07-10 DIAGNOSIS — N179 Acute kidney failure, unspecified: Secondary | ICD-10-CM

## 2021-07-10 DIAGNOSIS — J9601 Acute respiratory failure with hypoxia: Secondary | ICD-10-CM

## 2021-07-10 DIAGNOSIS — I4891 Unspecified atrial fibrillation: Secondary | ICD-10-CM

## 2021-07-10 LAB — CBC
HCT: 25.1 % — ABNORMAL LOW (ref 36.0–46.0)
Hemoglobin: 7.8 g/dL — ABNORMAL LOW (ref 12.0–15.0)
MCH: 27.1 pg (ref 26.0–34.0)
MCHC: 31.1 g/dL (ref 30.0–36.0)
MCV: 87.2 fL (ref 80.0–100.0)
Platelets: 183 10*3/uL (ref 150–400)
RBC: 2.88 MIL/uL — ABNORMAL LOW (ref 3.87–5.11)
RDW: 14.5 % (ref 11.5–15.5)
WBC: 12.2 10*3/uL — ABNORMAL HIGH (ref 4.0–10.5)
nRBC: 0 % (ref 0.0–0.2)

## 2021-07-10 LAB — BASIC METABOLIC PANEL
Anion gap: 9 (ref 5–15)
BUN: 18 mg/dL (ref 8–23)
CO2: 27 mmol/L (ref 22–32)
Calcium: 7.9 mg/dL — ABNORMAL LOW (ref 8.9–10.3)
Chloride: 92 mmol/L — ABNORMAL LOW (ref 98–111)
Creatinine, Ser: 1.45 mg/dL — ABNORMAL HIGH (ref 0.44–1.00)
GFR, Estimated: 37 mL/min — ABNORMAL LOW (ref >60)
Glucose, Bld: 125 mg/dL — ABNORMAL HIGH (ref 70–99)
Potassium: 4.1 mmol/L (ref 3.5–5.1)
Sodium: 128 mmol/L — ABNORMAL LOW (ref 135–145)

## 2021-07-10 LAB — GLUCOSE, CAPILLARY
Glucose-Capillary: 133 mg/dL — ABNORMAL HIGH (ref 70–99)
Glucose-Capillary: 110 mg/dL — ABNORMAL HIGH (ref 70–99)
Glucose-Capillary: 110 mg/dL — ABNORMAL HIGH (ref 70–99)
Glucose-Capillary: 124 mg/dL — ABNORMAL HIGH (ref 70–99)

## 2021-07-10 LAB — RESP PANEL BY RT-PCR (FLU A&B, COVID) ARPGX2
Influenza A by PCR: NEGATIVE
Influenza B by PCR: NEGATIVE
SARS Coronavirus 2 by RT PCR: NEGATIVE

## 2021-07-10 MED ORDER — MIDODRINE HCL 5 MG PO TABS: 5.0000 mg | ORAL_TABLET | Freq: Three times a day (TID) | ORAL | 1 refills | Status: DC

## 2021-07-10 MED ORDER — DIGOXIN 62.5 MCG PO TABS: 0.0625 mg | ORAL_TABLET | Freq: Every day | ORAL | 11 refills | Status: DC

## 2021-07-10 MED ORDER — IPRATROPIUM-ALBUTEROL 0.5-2.5 (3) MG/3ML IN SOLN
3.0000 mL | Freq: Two times a day (BID) | RESPIRATORY_TRACT | Status: DC
  Administered 2021-07-10 – 2021-07-11 (×2): 3 mL via RESPIRATORY_TRACT
  Filled 2021-07-10 (×2): qty 3

## 2021-07-10 MED ORDER — AMIODARONE HCL 200 MG PO TABS
200.0000 mg | ORAL_TABLET | Freq: Every day | ORAL | Status: DC
  Administered 2021-07-10 – 2021-07-12 (×3): 200 mg via ORAL
  Filled 2021-07-10 (×3): qty 1

## 2021-07-10 MED ORDER — CEFAZOLIN SODIUM-DEXTROSE 2-4 GM/100ML-% IV SOLN
2.0000 g | Freq: Three times a day (TID) | INTRAVENOUS | Status: DC
  Administered 2021-07-10 – 2021-07-12 (×7): 2 g via INTRAVENOUS
  Filled 2021-07-10 (×9): qty 100

## 2021-07-10 MED ORDER — CEFAZOLIN IV (FOR PTA / DISCHARGE USE ONLY): 2.0000 g | Freq: Three times a day (TID) | INTRAVENOUS | 0 refills | Status: AC

## 2021-07-10 MED ORDER — DIGOXIN 125 MCG PO TABS
0.0625 mg | ORAL_TABLET | Freq: Every day | ORAL | Status: DC
  Administered 2021-07-11 – 2021-07-12 (×2): 0.0625 mg via ORAL
  Filled 2021-07-10 (×2): qty 1

## 2021-07-10 MED ORDER — MIDODRINE HCL 5 MG PO TABS
5.0000 mg | ORAL_TABLET | Freq: Three times a day (TID) | ORAL | Status: DC
  Administered 2021-07-10 – 2021-07-12 (×7): 5 mg via ORAL
  Filled 2021-07-10 (×6): qty 1

## 2021-07-10 NOTE — Patient Outreach (Signed)
[facility]) Care Management  07/10/2021  Debra Saunders [birth year] [id number]   Case Closure     Patient has been inpatient greater than 10 days. Discharge plan is for SNF.     Plan: RN CM will close case.  Enzo Montgomery, RN,BSN,CCM [place] Management Telephonic Care Management Coordinator Direct Phone: [phone] Toll Free: [phone] Fax: [phone]

## 2021-07-10 NOTE — Progress Notes (Signed)
° ° °  [place] for Infectious Disease  Date of Admission:  06/29/2021            Brief Progress Note:  Received message that continuous Penicillin infusion will present challenges at rehabilitation facility. Will change renally dosed Cefazolin and continue with previous duration of treatment with end date of 08/08/21     Terri Piedra, NP [place] for Infectious Disease [facility]  07/10/2021  10:15 AM

## 2021-07-10 NOTE — TOC Transition Note (Addendum)
Transition of Care [facility]) - CM/SW Discharge Note   Patient Details  Name: Debra Saunders MRN: [id number] Date of Birth: Sep 04, 1945  Transition [facility]) CM/SW Contact:  Syleena Mchan, LCSW Phone Number: 07/10/2021, 10:32 AM   Clinical Narrative:    [facility] backed out today last minute for Ms. Foor's discharge today reporting that she is "too high acuity" for them to accept her for rehab. CSW notified MD and treatment team of [facility] declining to accept for discharge today.  CSW notified the patient's daughter Debra Saunders and discussed options. Debra Saunders reported she would like to see if [facility] is available for rehab.  11:38am - HF CSW spoke with [facility] [phone] to ask about bed availability and they reported they do have short  term rehab beds available and will take a look in the hub and call the CSW back. 2:10pm - HF CSW spoke with Tammy at [facility] and she needs to make sure that the family can bring the CPAP and uptravi and macitentan from home. 2:15pm - HF CSW spoke with Debra Saunders the patients daughter [phone] and she can bring the medications and CPAP from home for her mom. 2:17pm - Tammy from [facility] reported she can offer a bed for rehab. CSW will see about calling [employer] to switch the facility for the insurance authorization without having to put another through.  2:20pm - HF CSW called [employer] [phone] to see about modifying the insurance authorization to a different facility from [facility] to [employer]. CSW was informed by [facility] that a message had to be left for the inpatient worker Lorita Officer at [phone] to ask about modifying the insurance authorization to a new facility. CSW to follow up with Lorita Officer at [facility].  4:47pm - HF CSW spoke with the patient's daughter Debra Saunders to update her about the plan for [facility] and awaiting insurance authorization back before her mom will discharge. CSW informed Ms. Toy Cookey about [facility] SNF and awaiting insurance  authorization.  CSW will continue to follow throughout discharge.    Final next level of care: Skilled Nursing Facility Barriers to Discharge: No Barriers Identified   Patient Goals and CMS Choice Patient states their goals for this hospitalization and ongoing recovery are:: patient wants to get better CMS Medicare.gov Compare Post Acute Care list provided to:: Patient Choice offered to / list presented to : Patient  Discharge Placement PASRR number recieved: 07/10/21            Patient chooses bed at: [facility] Patient to be transferred to facility by: [place] Name of family member notified: Dtr, Debra Saunders Patient and family notified of of transfer: 07/10/21  Discharge Plan and Services In-house Referral: Clinical Social Work Discharge Planning Services: [employer] Consult Post Acute Care Choice: Home Health                               Social Determinants of Health (SDOH) Interventions Food Insecurity Interventions: Intervention Not Indicated Financial Strain Interventions: Intervention Not Indicated Housing Interventions: Intervention Not Indicated Transportation Interventions: [employer], Other (Comment) (family and friends also help provide transportation)   Readmission Risk Interventions No flowsheet data found.

## 2021-07-10 NOTE — Discharge Summary (Addendum)
Physician Discharge Summary   Patient: Debra Saunders MRN: [id number] DOB: Apr 29, 1946  Admit date:     06/29/2021  Discharge date: 07/12/21  Discharge Physician: Alma Friendly   PCP: [name], MD   Recommendations at discharge:   Continue IV penicillin G to complete 6 weeks of antibiotics, last day of treatment on 08/08/2021 New medicine: Digoxin 0.0625 mg starting 1/19 New medication: Midodrine 5 mg p.o. 3 times daily Follow-up with advanced heart failure clinic as scheduled Follow-up with infectious disease as scheduled Follow-up with PCP in 2 weeks Follow-up with cardiology/EP as scheduled  Discharge Diagnoses    Severe sepsis with septic shock secondary to infected pacemaker with streptoccos bacteremia    Atrial fibrillation with tachy brady syndrome    Hypertension   Pulmonary hypertension, unspecified ([place])   Hyperlipidemia   Acute on chronic respiratory failure with hypoxia (HCC)   Chronic diastolic CHF (congestive heart failure) ([place])   Acute renal failure superimposed on stage 3a chronic kidney disease ([place]) History of DVT (deep venous thrombosis) (HCC)   Acute blood loss anemia   Abdominal pain   Pressure injury of skin   OSA (obstructive sleep apnea)   Obesity (BMI 30-39.9)   klebsiella pneumoniae UTI     Hospital Course   76 year old female with hypertension, hyperlipidemia, diabetes, SDH 2014, SA node dysfunction status post pacemaker, OSA, PAH, interstitial lung disease on chronic 4 L O2, diastolic CHF, A. fib on Eliquis, CKD stage III AAA, cardiorenal syndrome, DVT, initially presented to [facility] ED on 06/25/2021 with shortness of breath, cough, fevers, chills dysuria and right upper quadrant pain. Patient was admitted with severe sepsis, septic shock secondary to multifocal pneumonia, septic emboli and strep bacteremia, Klebsiella UTI.  Initially required Levophed, stress dose steroids for short time.  TEE 1/6 consistent with infected pacemaker  lead with vegetation.  Patient also required 1 unit of packed RBCs for hemoglobin of 7, no plans for endoscopy. Acute on chronic CKD has improved.  LFTs has improved (shock liver) Patient was transferred to [facility] on 1/7 for pacer extraction, EP cardiology and cardiology were consulted, not considered a candidate for transvenous lead extraction due to significant comorbidities.  Patient also developed A. fib with RVR and was started on amiodarone. ID consulted, will need prolonged course of antibiotic treatment. Underwent TEE/DCCV on 07/04/2021   Severe sepsis with septic shock secondary to infected pacemaker with streptoccos bacteremia/endocarditis- (present on admission) Septic shock resolved - Blood cultures 1/03 + Streptococcus group G -TEE 1/6 large mobile vegetation on the RV pacing wire noted in the right atrium with estimated 1x 1.5 cm in size or larger -Blood cultures 1/5 no growth -Per EP cardiology, not a candidate for transvenous lead extraction because of significant comorbid conditions. 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   -Per ID continue IV Ancef, last day of treatment on 08/08/2021 followed by suppressive therapy with amoxicillin.  Outpatient follow-up with ID.  Atrial fibrillation with tachy brady syndrome - (present on admission) -Underwent successful DCCV.  TEE showed vegetation still present on pacemaker lead in RA and tricuspid valve -Continue eliquis, no acute issues  -Per cardiology recommendations, continue amiodarone 200 mg daily, digoxin 0.0625 mg p.o. daily  Normocytic anemia/Anemia of CKD- (present on admission) Baseline hgb: 9-10.  History of anemia of chronic disease  --GI was consulted, deferred endoscopy  -Received 2 units packed RBCs on 1/13 for hemoglobin 6.8   - FOBT  1/12 negative Follow-up with PCP  DVT (deep venous thrombosis) ([place])- (present on admission) -History of PE and DVT  -IVC filter was  placed in 2015 at the time of pacemaker placement -H&H stable, continue eliquis  Acute renal failure superimposed on stage 3a chronic kidney disease ([place])- (present on admission) -Creatinine peaked at 3.21, now back to baseline -PCP follow-up  Chronic diastolic CHF (congestive heart failure) ([place])- (present on admission) -2D echo 06/27/21: EF of 60-65% normal LVF. Diastolic parameters indeterminate -TEE showed EF of 55 to 60%, RV normal, mild to moderate MR -Continue torsemide -Follow-up with advanced heart failure clinic  Acute on chronic respiratory failure with hypoxia (HCC)- (present on admission) -Chronic respiratory failure secondary to ILD, pulmonary hypertension on 4L oxygen, originally required BiPAP, now back to baseline -Continue CPAP qhs for OSA and supplemental oxygen  Hyperlipidemia- (present on admission) - Continue Crestor, LFTs normal  Pulmonary hypertension ([name])- (present on admission) - Cardiology following, resumed sildenafil, macitentan, selexipag - Continue digoxin, on Eliquis, CPAP and home O2  Hypertension- (present on admission) At times with orthostatic hypotension.  Patient needs to be on TED hose.  Midodrine 5 mg p.o. 3 times daily added on day of discharge.  Pressure injury of skin- (present on admission) Pressure Injury 06/29/21 Right Stage 1 -  Intact skin with non-blanchable redness of a localized area usually over a bony prominence. abrasion on right gluteal fold (Active)  06/29/21 1500  Location:   Location Orientation: Right  Staging: Stage 1 -  Intact skin with non-blanchable redness of a localized area usually over a bony prominence.  Wound Description (Comments): abrasion on right gluteal fold  Present on Admission: Yes   Diabetes mellitus type II ([place]) -Hemoglobin A1c of 5.4 on 06/25/2021 -Well-controlled  klebsiella pneumoniae UTI- (present on admission) - Completed course of antibiotics for UTI  Obesity (BMI 30-39.9)- (present on  admission) BMI 39.7.  Healthy lifestyle recommended.  OSA (obstructive sleep apnea)- (present on admission) - Continue CPAP nightly      Consultants:  Cardiology/advanced heart failure Infectious disease Gastroenterology  Procedures performed:  TEE 1/6 DCCV TEE on 1/12  Disposition: Skilled nursing facility Diet recommendation: Cardiac diet  DISCHARGE MEDICATION: Allergies as of 07/12/2021       Reactions   Aspirin Anaphylaxis, Hives   Pt states that she is unable to take because she had bleeding in her brain   Other Other (See Comments)   Pt states that she is unable to take blood thinners because she had bleeding in her brain; Blood Thinners-per doctor at [facility] (Xarelto)        Medication List     STOP taking these medications    potassium chloride SA 20 MEQ tablet Commonly known as: KLOR-CON M   spironolactone 25 MG tablet Commonly known as: ALDACTONE       TAKE these medications    acetaminophen 325 MG tablet Commonly known as: TYLENOL Take 325-650 mg by mouth every 6 (six) hours as needed for mild pain.   albuterol 0.63 MG/3ML nebulizer solution Commonly known as: ACCUNEB Take 1 ampule by nebulization every 6 (six) hours as needed for wheezing.   amiodarone 200 MG tablet Commonly known as: PACERONE Take 1 tablet (200 mg total) by mouth daily.   apixaban 5 MG Tabs tablet Commonly known as: ELIQUIS Take 5 mg by mouth 2 (two) times daily.   benzonatate 200 MG capsule Commonly known as: TESSALON Take 1 capsule (200 mg total) by mouth 3 (three) times  daily as needed for cough.   BIOFREEZE EX Apply 1 application topically 4 (four) times daily as needed (for pain).   ceFAZolin  IVPB Commonly known as: ANCEF Inject 2 g into the vein every 8 (eight) hours. Indication:  Bacteremia/ Pacemaker Vegetation First Dose: Yes Last Day of Therapy:  08/08/21 Labs - Once weekly:  CBC/D and BMP, Labs - Every other week:  ESR and CRP Method of  administration: IV Push Method of administration may be changed at the discretion of home infusion pharmacist based upon assessment of the patient and/or caregiver's ability to self-administer the medication ordered.   cholestyramine light 4 g packet Commonly known as: PREVALITE Take 1 packet (4 g total) by mouth daily as needed (diarrhea).   Difluprednate 0.05 % Emul Place 1 drop into both eyes 2 (two) times daily.   Digoxin 62.5 MCG Tabs Take 0.0625 mg by mouth daily. What changed:  medication strength how much to take   DULoxetine 30 MG capsule Commonly known as: CYMBALTA TAKE 1 CAPSULE BY MOUTH ONCE DAILY FOR 14 DAYS, THEN INCREASE TO 2 CAPSULES ONCE DAILY What changed: See the new instructions.   gabapentin 300 MG capsule Commonly known as: NEURONTIN Take 600 mg by mouth 3 (three) times daily.   macitentan 10 MG tablet Commonly known as: OPSUMIT Take 10 mg by mouth daily.   meclizine 25 MG tablet Commonly known as: ANTIVERT Take 1 tablet (25 mg total) by mouth 3 (three) times daily as needed for dizziness.   midodrine 5 MG tablet Commonly known as: PROAMATINE Take 1 tablet (5 mg total) by mouth 3 (three) times daily with meals.   rosuvastatin 10 MG tablet Commonly known as: CRESTOR Take 1 tablet by mouth once daily   sildenafil 20 MG tablet Commonly known as: REVATIO Take 20 mg by mouth 3 (three) times daily.   Torsemide 40 MG Tabs Take 40 mg by mouth 2 (two) times daily. Start taking on: July 13, 2021 What changed: medication strength   Uptravi 1600 MCG Tabs Generic drug: Selexipag Take 1,600 mcg by mouth.               Durable Medical Equipment  (From admission, onward)           Start     Ordered   07/04/21 0724  Heart failure home health orders  (Heart failure home health orders / Face to face)  Once       Comments: Heart Failure Follow-up Care:  Verify follow-up appointments per Patient Discharge Instructions. Confirm transportation  arranged. Reconcile home medications with discharge medication list. Remove discontinued medications from use. Assist patient/caregiver to manage medications using pill box. Reinforce low sodium food selection BMET and CBC weekly Assessments: Vital signs and oxygen saturation at each visit. Assess home environment for safety concerns, caregiver support and availability of low-sodium foods. Consult Education officer, museum, PT/OT, Dietitian, and CNA based on assessments. Perform comprehensive cardiopulmonary assessment. Notify MD for any change in condition or weight gain of 3 pounds in one day or 5 pounds in one week with symptoms. Daily Weights and Symptom Monitoring: Ensure patient has access to scales. Teach patient/caregiver to weigh daily before breakfast and after voiding using same scale and record.    Teach patient/caregiver to track weight and symptoms and when to notify Provider. Activity: Develop individualized activity plan with patient/caregiver.  Question Answer Comment  Heart Failure Follow-up Care Advanced Heart Failure (AHF) [facility] at [place] Visits Set up telemonitoring  equipment to monitor daily vital signs, weights and oxygen saturation   Obtain the following labs Other see comments   Lab frequency Weekly   Fax lab results to AHF Clinic at [phone]   Diet Low Sodium Heart Healthy   Fluid restrictions: 1800 mL Fluid   Skilled Nurse to notify MD of weight trends weekly for first 2 weeks. May fax or call: AHF Clinic at [phone] (fax) or [phone]      07/04/21 0724   07/02/21 1215  For home use only DME continuous positive airway pressure (CPAP)  Once       Comments: Autotitration 5/20 CPAP needed, old device is not functioning (not repairable), benefits from wearing CPAP  Question Answer Comment  Length of Need Lifetime   Patient has OSA or probable OSA Yes   Is the patient currently using CPAP in the home Yes   If yes (to question two) Determine DME  provider and inform them of any new orders/settings   Date of face to face encounter 07/02/2021   Settings Other see comments   Signs and symptoms of probable OSA  (select all that apply) Snoring   CPAP supplies needed Mask, headgear, cushions, filters, heated tubing and water chamber      07/02/21 1214              Discharge Care Instructions  (From admission, onward)           Start     Ordered   07/10/21 0000  Change dressing on IV access line weekly and PRN  (Home infusion instructions - [facility] )        07/10/21 1057   07/09/21 0000  Change dressing on IV access line weekly and PRN  (Home infusion instructions - [facility] )        07/09/21 1318            Follow-up Information     [place], FNP Follow up.   Specialty: Infectious Diseases Why: 9:30 am on 07/25/21. Please call to reschedule if you are not able to make this appointment. Contact information: [address] [place] [address] [phone]         [place] Follow up on 07/16/2021.   Specialty: Cardiology Why: 1/30 at 10:30 AM   [facility] at [place], [place] information: [address] [phone] [place]        [name], MD. Schedule an appointment as soon as possible for a visit in 2 week(s).   Specialty: Family Medicine Contact information: [address] [place] [address] [phone]         Wellington Hampshire, MD .   Specialty: Cardiology Contact information: [address] [place] [address] [phone]         Deboraha Sprang, MD .   Specialty: Cardiology Contact information: [place] [phone] [place] Follow up.   Why: CPAP -DME is  working on Contractor information: [place] [id number] [phone]                 Discharge Exam: Filed Weights   07/10/21 0406 07/11/21 0500 07/12/21 0650  Weight: 91.2 kg 90.9 kg 89.9 kg  S: No acute complaints, hoping to be discharged today  BP (!) 129/56 (BP Location: Left Arm)    Pulse 72    Temp 98.9 F (37.2 C) (Oral)    Resp (!) 23    Ht 5' (1.524 m)    Wt 89.9 kg    SpO2 90%    BMI 38.71 kg/m     Physical Exam General: Alert and oriented x 3, NAD Cardiovascular: S1 S2 clear, RRR. No pedal edema b/l Respiratory: CTAB, no wheezing, rales or rhonchi Gastrointestinal: Soft, nontender, nondistended, NBS Ext: no pedal edema bilaterally Neuro: no new deficits  Condition at discharge: fair  The results of significant diagnostics from this hospitalization (including imaging, microbiology, ancillary and laboratory) are listed below for reference.   Imaging Studies: CT ABDOMEN PELVIS WO CONTRAST  Result Date: 06/24/2021 CLINICAL DATA:  Abdominal pain, nonlocalized. Diarrhea loss of appetite for past few days. EXAM: CT ABDOMEN AND PELVIS WITHOUT CONTRAST TECHNIQUE: Multidetector CT imaging of the abdomen and pelvis was performed following the standard protocol without IV contrast. COMPARISON:  CT examination dated October 15, 2020 FINDINGS: Lower chest: No acute abnormality. Hepatobiliary: No focal liver abnormality is seen. Status post cholecystectomy. No biliary dilatation. Pancreas: Unremarkable. No pancreatic ductal dilatation or surrounding inflammatory changes. Spleen: Normal in size without focal abnormality. Adrenals/Urinary Tract: Adrenal glands are unremarkable. Kidneys are normal, without renal calculi, focal lesion, or hydronephrosis. Bladder is unremarkable. Stomach/Bowel: Stomach is within normal limits. Appendix not visualized. No evidence of bowel wall thickening, distention, or inflammatory changes. Colonic diverticulosis  without evidence of acute diverticulitis. Vascular/Lymphatic: Aortic atherosclerosis. No enlarged abdominal or pelvic lymph nodes. IVC filter is noted. Reproductive: Status post hysterectomy. Stable hypodense cystic structures in bilateral adnexal region. Other: No abdominal wall hernia or abnormality. No abdominopelvic ascites. Musculoskeletal: Degenerate disc disease of the lumbar spine with mild levoscoliosis. No acute osseous abnormality. IMPRESSION: 1. Sigmoid colonic diverticulosis without evidence of acute diverticulitis. 2. Bowel loops are normal in caliber without evidence of obstruction or significant wall thickening. 3. Status post cholecystectomy and hysterectomy. 4. 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 IVC filter is present. Electronically Signed   By: Keane Police D.O.   On: 06/24/2021 16:17   DG Chest Port 1 View  Result Date: 07/03/2021 CLINICAL DATA:  RIGHT PICC line inserted EXAM: PORTABLE CHEST 1 VIEW COMPARISON:  07/12/2020 FINDINGS: Interval placement RIGHT PICC line with tip at the RIGHT cavoatrial junction. LEFT-sided pacer noted. Stable cardiac silhouette. Mild pulmonary edema pattern. IMPRESSION: 1. RIGHT PICC line insertion with tip at the cavoatrial junction. 2. Mild pulmonary edema pattern. Electronically Signed   By: Suzy Bouchard M.D.   On: 07/03/2021 11:54   DG Chest Portable 1 View  Result Date: 06/25/2021 CLINICAL DATA:  Chest and back pain with diarrhea. EXAM: PORTABLE CHEST 1 VIEW COMPARISON:  01/15/2021 FINDINGS: 0459 hours. Stable asymmetric elevation right hemidiaphragm. Diffuse interstitial opacity again noted with new areas of patchy airspace disease in the right upper lobe and medial right base. No substantial pleural effusion. Left-sided permanent pacemaker again noted. Telemetry leads overlie the chest. IMPRESSION: Interval development of patchy airspace disease in the right upper lobe and medial right base. Multifocal pneumonia concern.  Electronically Signed   By: Misty Stanley M.D.   On: 06/25/2021 05:23   ECHOCARDIOGRAM COMPLETE  Result Date: 06/27/2021    ECHOCARDIOGRAM REPORT   Patient Name:   Debra Saunders Date of Exam: 06/26/2021 Medical Rec #:  [id number]  Height:       67.0 in Accession #:    [id number]       Weight:       192.0 lb Date of Birth:  14-Apr-1946         BSA:          1.988 m Patient Age:    50 years         BP:           139/44 mmHg Patient Gender: F                HR:           87 bpm. Exam Location:  [facility] Procedure: 2D Echo, Cardiac Doppler and Color Doppler Indications:     R78.81 Bacteremia  History:         Patient has prior history of Echocardiogram examinations, most                  recent 09/12/2019. Cardiomyopathy, Stroke, Arrythmias:Atrial                  Fibrillation; Risk Factors:Hypertension, Dyslipidemia, Diabetes                  and Sleep Apnea.  Sonographer:     Cresenciano Lick RDCS Referring Phys:  [id number] Barb Merino Diagnosing Phys: Ida Rogue MD IMPRESSIONS  1. Unable to exclude valve endocarditis as detailed below.  2. Left ventricular ejection fraction, by estimation, is 60 to 65%. The left ventricle has normal function. The left ventricle has no regional wall motion abnormalities. There is mild left ventricular hypertrophy. Left ventricular diastolic parameters are indeterminate.  3. Right ventricular systolic function is normal. The right ventricular size is normal. There is moderately elevated pulmonary artery systolic pressure. The estimated right ventricular systolic pressure is 05.3 mmHg.  4. The mitral valve is normal in structure. Mild to moderate mitral valve regurgitation. No evidence of mitral stenosis.  5. Tricuspid valve regurgitation is moderate. Thickening of the tricuspid leaflets, unable to exclude vegetation.  6. The aortic valve is normal in structure. There is moderate calcification of the aortic valve. There is moderate thickening of the aortic valve. Aortic  valve regurgitation is not visualized. valve thickening appears worse compared to study in 2021, unable to exclude vegetation.  7. Aortic valve sclerosis/calcification is present, without any evidence of aortic stenosis.  8. The inferior vena cava is normal in size with greater than 50% respiratory variability, suggesting right atrial pressure of 3 mmHg. FINDINGS  Left Ventricle: Left ventricular ejection fraction, by estimation, is 60 to 65%. The left ventricle has normal function. The left ventricle has no regional wall motion abnormalities. The left ventricular internal cavity size was normal in size. There is  mild left ventricular hypertrophy. Left ventricular diastolic parameters are indeterminate. Right Ventricle: The right ventricular size is normal. No increase in right ventricular wall thickness. Right ventricular systolic function is normal. There is moderately elevated pulmonary artery systolic pressure. The tricuspid regurgitant velocity is 3.41 m/s, and with an assumed right atrial pressure of 5 mmHg, the estimated right ventricular systolic pressure is 97.6 mmHg. Left Atrium: Left atrial size was normal in size. Right Atrium: Right atrial size was normal in size. Pericardium: There is no evidence of pericardial effusion. Mitral Valve: The mitral valve is normal in structure. Mild to moderate mitral valve regurgitation. No evidence of mitral valve stenosis. Tricuspid Valve: The tricuspid valve is normal in structure. Tricuspid valve regurgitation is  moderate . No evidence of tricuspid stenosis. Aortic Valve: The aortic valve is normal in structure. There is moderate calcification of the aortic valve. There is moderate thickening of the aortic valve. Aortic valve regurgitation is not visualized. Aortic valve sclerosis/calcification is present, without any evidence of aortic stenosis. Aortic valve mean gradient measures 10.5 mmHg. Aortic valve peak gradient measures 18.4 mmHg. Aortic valve area, by VTI  measures 1.73 cm. Pulmonic Valve: The pulmonic valve was normal in structure. Pulmonic valve regurgitation is mild. No evidence of pulmonic stenosis. Aorta: The aortic root is normal in size and structure. Venous: The inferior vena cava is normal in size with greater than 50% respiratory variability, suggesting right atrial pressure of 3 mmHg. IAS/Shunts: No atrial level shunt detected by color flow Doppler. Additional Comments: A device lead is visualized.  LEFT VENTRICLE PLAX 2D LVIDd:         5.00 cm   Diastology LVIDs:         2.80 cm   LV e' medial:    9.25 cm/s LV PW:         1.20 cm   LV E/e' medial:  14.9 LV IVS:        1.20 cm   LV e' lateral:   7.83 cm/s LVOT diam:     1.90 cm   LV E/e' lateral: 17.6 LV SV:         62 LV SV Index:   31 LVOT Area:     2.84 cm  RIGHT VENTRICLE             IVC RV Basal diam:  3.80 cm     IVC diam: 1.80 cm RV S prime:     18.47 cm/s TAPSE (M-mode): 2.2 cm LEFT ATRIUM             Index        RIGHT ATRIUM           Index LA diam:        4.00 cm 2.01 cm/m   RA Area:     17.40 cm LA Vol (A2C):   75.5 ml 37.99 ml/m  RA Volume:   48.20 ml  24.25 ml/m LA Vol (A4C):   79.3 ml 39.90 ml/m LA Biplane Vol: 78.5 ml 39.50 ml/m  AORTIC VALVE AV Area (Vmax):    1.71 cm AV Area (Vmean):   1.72 cm AV Area (VTI):     1.73 cm AV Vmax:           214.50 cm/s AV Vmean:          152.250 cm/s AV VTI:            0.356 m AV Peak Grad:      18.4 mmHg AV Mean Grad:      10.5 mmHg LVOT Vmax:         129.00 cm/s LVOT Vmean:        92.350 cm/s LVOT VTI:          0.217 m LVOT/AV VTI ratio: 0.61  AORTA Ao Root diam: 2.80 cm Ao Asc diam:  3.50 cm MV E velocity: 137.67 cm/s  TRICUSPID VALVE                             TR Peak grad:   46.5 mmHg  TR Vmax:        341.00 cm/s                              SHUNTS                             Systemic VTI:  0.22 m                             Systemic Diam: 1.90 cm Ida Rogue MD Electronically signed by Ida Rogue MD  Signature Date/Time: 06/27/2021/10:07:07 AM    Final    ECHO TEE  Result Date: 07/04/2021    TRANSESOPHOGEAL ECHO REPORT   Patient Name:   Debra Saunders Date of Exam: 07/04/2021 Medical Rec #:  [id number]        Height:       60.0 in Accession #:    [id number]       Weight:       200.4 lb Date of Birth:  [birth year]         BSA:          1.868 m Patient Age:    14 years         BP:           108/43 mmHg Patient Gender: F                HR:           59 bpm. Exam Location:  Inpatient Procedure: Transesophageal Echo, Color Doppler and Cardiac Doppler Indications:     I48.91* Unspecified atrial fibrillation  History:         Patient has prior history of Echocardiogram examinations, most                  recent 06/28/2021. CHF, Pulmonary HTN, Arrythmias:Atrial                  Fibrillation; Risk Factors:Hypertension, Diabetes, Dyslipidemia                  and Sleep Apnea.  Sonographer:     [name] Sarna Senior RDCS Referring Phys:  [place] Diagnosing Phys: Franki Monte PROCEDURE: After discussion of the risks and benefits of a TEE, an informed consent was obtained from the patient. The transesophogeal probe was passed without difficulty through the esophogus of the patient. Sedation performed by different physician. The patient was monitored while under deep sedation. Anesthestetic sedation was provided intravenously by Anesthesiology: 75m of Propofol. The patient developed no complications during the procedure. A successful direct current cardioversion was performed at 200 joules with 1 attempt. IMPRESSIONS  1. Left ventricular ejection fraction, by estimation, is 60 to 65%. The left ventricle has normal function. There is mild left ventricular hypertrophy.  2. Peak RV-RA gradient 30 mmHg. 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 Right ventricular systolic function is normal. The right ventricular size is mildly enlarged.  3. Left atrial size was mildly dilated. No left  atrial/left atrial appendage thrombus was detected.  4. Right atrial size was moderately dilated.  5. The mitral valve is normal in structure. Mild to moderate mitral valve regurgitation. No evidence of mitral stenosis.  6. There is mobile vegetation on the tricuspid valve. The tricuspid valve is abnormal. Tricuspid  valve regurgitation is moderate.  7. The aortic valve is tricuspid. Aortic valve regurgitation is not visualized. Aortic valve sclerosis/calcification is present, without any evidence of aortic stenosis.  8. Grade 3 plaque in the descending thoracic aorta.  9. No PFO or ASD by color doppler. FINDINGS  Left Ventricle: Left ventricular ejection fraction, by estimation, is 60 to 65%. The left ventricle has normal function. The left ventricular internal cavity size was normal in size. There is mild left ventricular hypertrophy. Right Ventricle: Peak RV-RA gradient 30 mmHg. 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 The right ventricular size is mildly enlarged. No increase in right ventricular wall thickness. Right ventricular systolic function  is normal. Left Atrium: Left atrial size was mildly dilated. No left atrial/left atrial appendage thrombus was detected. Right Atrium: Right atrial size was moderately dilated. Pericardium: There is no evidence of pericardial effusion. Mitral Valve: The mitral valve is normal in structure. Mild to moderate mitral valve regurgitation. No evidence of mitral valve stenosis. Tricuspid Valve: There is mobile vegetation on the tricuspid valve. The tricuspid valve is abnormal. Tricuspid valve regurgitation is moderate. Aortic Valve: The aortic valve is tricuspid. Aortic valve regurgitation is not visualized. Aortic valve sclerosis/calcification is present, without any evidence of aortic stenosis. Pulmonic Valve: The pulmonic valve was normal in structure. Pulmonic valve regurgitation is not visualized. Aorta: Grade 3 plaque in the  descending thoracic aorta. The aortic root is normal in size and structure. IAS/Shunts: No PFO or ASD by color doppler. Additional Comments: A device lead is visualized in the right ventricle. [employer] Electronically signed by Franki Monte Signature Date/Time: 07/04/2021/3:51:43 PM    Final    ECHO TEE  Result Date: 06/28/2021    TRANSESOPHOGEAL ECHO REPORT   Patient Name:   Debra Saunders Date of Exam: 06/28/2021 Medical Rec #:  [id number]        Height:       67.0 in Accession #:    [id number]       Weight:       192.0 lb Date of Birth:  1945-09-09         BSA:          1.988 m Patient Age:    68 years         BP:           119/44 mmHg Patient Gender: F                HR:           71 bpm. Exam Location:  [facility] Procedure: Transesophageal Echo, Cardiac Doppler, Color Doppler and Saline            Contrast Bubble Study Indications:     Bacteremia R78.81  History:         Patient has prior history of Echocardiogram examinations, most                  recent 06/26/2021. Pacemaker, Arrythmias:Atrial Fibrillation;                  Risk Factors:Hypertension.  Sonographer:     Sherrie Sport Referring Phys:  [id number] [name] Diagnosing Phys: Ida Rogue MD PROCEDURE: After discussion of the risks and benefits of a TEE, an informed consent was obtained from the patient. TEE procedure time was 30 minutes. The transesophogeal probe was passed without difficulty through the esophogus of the patient. Imaged were obtained  with the patient in a left lateral decubitus position. Local oropharyngeal anesthetic was provided with Benzocaine spray and Cetacaine. Sedation performed by performing physician. Patients was under conscious sedation during this procedure.  Anesthetic administered: 22mg of Fentanyl, 330mof Versed. Image quality was excellent. The patient's vital signs; including heart rate, blood pressure, and oxygen saturation; remained stable throughout the procedure. The patient developed no complications during  the procedure. IMPRESSIONS  1. Large mobile vegetation on the RV pacing wire noted in the right atrium, with estimated to be 1 x 1.5 cm in size or larger.  2. Left ventricular ejection fraction, by estimation, is 55 to 60%. The left ventricle has normal function. The left ventricle has no regional wall motion abnormalities. There is mild left ventricular hypertrophy.  3. Right ventricular systolic function is normal. The right ventricular size is normal.  4. No left atrial/left atrial appendage thrombus was detected.  5. The mitral valve is normal in structure. Mild to moderate mitral valve regurgitation. No evidence of mitral stenosis.  6. The aortic valve is normal in structure. There is moderate calcification of the aortic valve. Aortic valve regurgitation is not visualized. No aortic stenosis is present.  7. There is mild (Grade II) atheroma plaque involving the aortic arch and descending aorta.  8. Agitated saline contrast bubble study was negative, with no evidence of any interatrial shunt. Conclusion(s)/Recommendation(s): Normal biventricular function without evidence of hemodynamically significant valvular heart disease. FINDINGS  Left Ventricle: Left ventricular ejection fraction, by estimation, is 55 to 60%. The left ventricle has normal function. The left ventricle has no regional wall motion abnormalities. The left ventricular internal cavity size was normal in size. There is  mild left ventricular hypertrophy. Right Ventricle: The right ventricular size is normal. No increase in right ventricular wall thickness. Right ventricular systolic function is normal. Left Atrium: Left atrial size was normal in size. No left atrial/left atrial appendage thrombus was detected. Right Atrium: Right atrial size was normal in size. Pericardium: There is no evidence of pericardial effusion. Mitral Valve: The mitral valve is normal in structure. Mild to moderate mitral valve regurgitation. No evidence of mitral valve  stenosis. Tricuspid Valve: The tricuspid valve is normal in structure. Tricuspid valve regurgitation is not demonstrated. No evidence of tricuspid stenosis. Aortic Valve: The aortic valve is normal in structure. There is moderate calcification of the aortic valve. Aortic valve regurgitation is not visualized. No aortic stenosis is present. There is no evidence of aortic valve vegetation. Pulmonic Valve: The pulmonic valve was normal in structure. Pulmonic valve regurgitation is not visualized. No evidence of pulmonic stenosis. Aorta: The aortic root is normal in size and structure. There is mild (Grade II) atheroma plaque involving the aortic arch and descending aorta. IAS/Shunts: No atrial level shunt detected by color flow Doppler. Agitated saline contrast was given intravenously to evaluate for intracardiac shunting. Agitated saline contrast bubble study was negative, with no evidence of any interatrial shunt. Additional Comments: A device lead is visualized. TiIda RogueD Electronically signed by TiIda RogueD Signature Date/Time: 06/28/2021/5:38:15 PM    Final (Updated)    [place] SITE RITE  Result Date: 07/02/2021 If Site Rite image not attached, placement could not be confirmed due to current cardiac rhythm.  [place]abdomen Limited RUQ (LIVER/GB)  Result Date: 06/26/2021 CLINICAL DATA:  Right upper quadrant pain. EXAM: ULTRASOUND ABDOMEN LIMITED RIGHT UPPER QUADRANT COMPARISON:  CT abdomen/pelvis 06/24/2021. FINDINGS: Gallbladder: Cholecystectomy. Common bile duct: Diameter: 5 mm, within normal limits. Liver:  No focal lesion identified. Within normal limits in parenchymal echogenicity. Portal vein is patent on color Doppler imaging with normal direction of blood flow towards the liver. IMPRESSION: Cholecystectomy.  No evidence of acute abnormality. Electronically Signed   By: Margaretha Sheffield M.D.   On: 06/26/2021 09:28    Microbiology: Results for orders placed or performed during the hospital  encounter of 06/29/21  Resp Panel by RT-PCR (Flu A&B, Covid) Nasopharyngeal Swab     Status: None   Collection Time: 07/09/21 11:19 AM   Specimen: Nasopharyngeal Swab; Nasopharyngeal(NP) swabs in vial transport medium  Result Value Ref Range Status   SARS Coronavirus 2 by RT PCR NEGATIVE NEGATIVE Final    Comment: (NOTE) SARS-CoV-2 target nucleic acids are NOT DETECTED.  The SARS-CoV-2 RNA is generally detectable in upper respiratory specimens during the acute phase of infection. The lowest concentration of SARS-CoV-2 viral copies this assay can detect is 138 copies/mL. A negative result does not preclude SARS-Cov-2 infection and should not be used as the sole basis for treatment or other patient management decisions. A negative result may occur with  improper specimen collection/handling, submission of specimen other than nasopharyngeal swab, presence of viral mutation(s) within the areas targeted by this assay, and inadequate number of viral copies(<138 copies/mL). A negative result must be combined with clinical observations, patient history, and epidemiological information. The expected result is Negative.  Fact Sheet for Patients:  EntrepreneurPulse.com.au  Fact Sheet for Healthcare Providers:  IncredibleEmployment.be  This test is no t yet approved or cleared by the [place] FDA and  has been authorized for detection and/or diagnosis of SARS-CoV-2 by FDA under an Emergency Use Authorization (EUA). This EUA will remain  in effect (meaning this test can be used) for the duration of the COVID-19 declaration under Section 564(b)(1) of the Act, 21 U.S.C.section 360bbb-3(b)(1), unless the authorization is terminated  or revoked sooner.       Influenza A by PCR NEGATIVE NEGATIVE Final   Influenza B by PCR NEGATIVE NEGATIVE Final    Comment: (NOTE) The Xpert Xpress SARS-CoV-2/FLU/RSV plus assay is intended as an aid in the diagnosis of  influenza from Nasopharyngeal swab specimens and should not be used as a sole basis for treatment. Nasal washings and aspirates are unacceptable for Xpert Xpress SARS-CoV-2/FLU/RSV testing.  Fact Sheet for Patients: EntrepreneurPulse.com.au  Fact Sheet for Healthcare Providers: IncredibleEmployment.be  This test is not yet approved or cleared by the [place] FDA and has been authorized for detection and/or diagnosis of SARS-CoV-2 by FDA under an Emergency Use Authorization (EUA). This EUA will remain in effect (meaning this test can be used) for the duration of the COVID-19 declaration under Section 564(b)(1) of the Act, 21 U.S.C. section 360bbb-3(b)(1), unless the authorization is terminated or revoked.  Performed at [facility], [place] [address] [place], [place] [address]   Resp Panel by RT-PCR (Flu A&B, Covid) Nasopharyngeal Swab     Status: None   Collection Time: 07/10/21  9:04 AM   Specimen: Nasopharyngeal Swab; Nasopharyngeal(NP) swabs in vial transport medium  Result Value Ref Range Status   SARS Coronavirus 2 by RT PCR NEGATIVE NEGATIVE Final    Comment: (NOTE) SARS-CoV-2 target nucleic acids are NOT DETECTED.  The SARS-CoV-2 RNA is generally detectable in upper respiratory specimens during the acute phase of infection. The lowest concentration of SARS-CoV-2 viral copies this assay can detect is 138 copies/mL. A negative result does not preclude SARS-Cov-2 infection and should not be used as  the sole basis for treatment or other patient management decisions. A negative result may occur with  improper specimen collection/handling, submission of specimen other than nasopharyngeal swab, presence of viral mutation(s) within the areas targeted by this assay, and inadequate number of viral copies(<138 copies/mL). A negative result must be combined with clinical observations, patient history, and epidemiological information. The  expected result is Negative.  Fact Sheet for Patients:  EntrepreneurPulse.com.au  Fact Sheet for Healthcare Providers:  IncredibleEmployment.be  This test is no t yet approved or cleared by the [place] FDA and  has been authorized for detection and/or diagnosis of SARS-CoV-2 by FDA under an Emergency Use Authorization (EUA). This EUA will remain  in effect (meaning this test can be used) for the duration of the COVID-19 declaration under Section 564(b)(1) of the Act, 21 U.S.C.section 360bbb-3(b)(1), unless the authorization is terminated  or revoked sooner.       Influenza A by PCR NEGATIVE NEGATIVE Final   Influenza B by PCR NEGATIVE NEGATIVE Final    Comment: (NOTE) The Xpert Xpress SARS-CoV-2/FLU/RSV plus assay is intended as an aid in the diagnosis of influenza from Nasopharyngeal swab specimens and should not be used as a sole basis for treatment. Nasal washings and aspirates are unacceptable for Xpert Xpress SARS-CoV-2/FLU/RSV testing.  Fact Sheet for Patients: EntrepreneurPulse.com.au  Fact Sheet for Healthcare Providers: IncredibleEmployment.be  This test is not yet approved or cleared by the [place] FDA and has been authorized for detection and/or diagnosis of SARS-CoV-2 by FDA under an Emergency Use Authorization (EUA). This EUA will remain in effect (meaning this test can be used) for the duration of the COVID-19 declaration under Section 564(b)(1) of the Act, 21 U.S.C. section 360bbb-3(b)(1), unless the authorization is terminated or revoked.  Performed at [facility], [place] [address] [place], [place] [address]    *Note: Due to a large number of results and/or encounters for the requested time period, some results have not been displayed. A complete set of results can be found in Results Review.    Labs: CBC: Recent Labs  Lab 07/08/21 0355 07/09/21 0345  07/10/21 0120 07/11/21 0139 07/12/21 0110  WBC 12.4* 12.9* 12.2* 11.0* 11.6*  HGB 8.1* 7.7* 7.8* 7.2* 8.7*  HCT 25.1* 24.4* 25.1* 23.0* 26.6*  MCV 87.5 88.1 87.2 87.5 86.9  PLT 159 170 183 170 287   Basic Metabolic Panel: Recent Labs  Lab 07/08/21 0355 07/09/21 0345 07/10/21 0120 07/11/21 0139 07/12/21 0110  NA 131* 130* 128* 130* 131*  K 3.7 4.1 4.1 3.9 3.7  CL 95* 94* 92* 95* 96*  CO2 '31 30 27 26 26  ' GLUCOSE 104* 125* 125* 108* 117*  Saunders '14 16 18 16 15  ' CREATININE 1.37* 1.54* 1.45* 1.36* 1.29*  CALCIUM 7.8* 7.9* 7.9* 7.7* 8.0*   Liver Function Tests: No results for input(s): AST, ALT, ALKPHOS, BILITOT, PROT, ALBUMIN in the last 168 hours. CBG: Recent Labs  Lab 07/11/21 1610 07/11/21 2154 07/12/21 0630 07/12/21 1127 07/12/21 1529  GLUCAP 112* 121* 120* 111* 114*    Discharge time spent: greater than 30 minutes.  Signed: Alma Friendly, MD [facility] 07/12/2021

## 2021-07-10 NOTE — Progress Notes (Addendum)
Physical Therapy Treatment Patient Details Name: Debra Saunders MRN: [id number] DOB: Oct 15, 1945 Today's Date: 07/10/2021   History of Present Illness Pt is a 76 y.o. admitted 06/25/21 with c/o SOB, fever, chills, abdominal pain. Workup for septic shock secondary to klebsiella UTI and strep bacteremia with GIB, multifocal PNA, possible septic lung emboli. 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 Transfer to [facility] 06/29/21. Per cardiology, not a candidate for transvenous pacemaker lead extraction due to comorbidities. Pt developed afib with RVR; had DCCV 1/12. PMH includes HTN, afib, pacemaker, PAH, chronic resp failure (on 4L home O2), CHF, cardiorenal syndrome, DM2, OSA, stroke, ICH, chronic back pain, arthritis.   PT Comments    Pt slowly progressing with mobility, endorses headache, fatigue and chills this session. Pt tolerated repeated bouts of sitting and standing activity. Pt remains limited by generalized weakness, decreased activity tolerance, and impaired balance strategies/postural reactions. Continue to recommend SNF-level therapies to maximize functional mobility and independence prior to return home.  HR 70s-80s    Recommendations for follow up therapy are one component of a multi-disciplinary discharge planning process, led by the attending physician.  Recommendations may be updated based on patient status, additional functional criteria and insurance authorization.  Follow Up Recommendations  Skilled nursing-short term rehab (<3 hours/day)     Assistance Recommended at Discharge Frequent or constant Supervision/Assistance  Patient can return home with the following A little help with walking and/or transfers;A little help with bathing/dressing/bathroom;Assistance with cooking/housework;Assist for transportation;Help with stairs or ramp for entrance   Equipment Recommendations  Rollator (4 wheels);BSC/3in1 (TBD)     Recommendations for Other Services       Precautions / Restrictions Precautions Precautions: Fall;Other (comment) Precaution Comments: bladder/bowel incontinence, watch HR, wears 4L O2 baseline, fatigues quickly Restrictions Weight Bearing Restrictions: No     Mobility  Bed Mobility Overal bed mobility: Needs Assistance Bed Mobility: Supine to Sit     Supine to sit: Supervision, HOB elevated Sit to supine: Min assist   General bed mobility comments: MinA for BLE management return to supine; increased time and effort    Transfers Overall transfer level: Needs assistance Equipment used: Rolling walker (2 wheels) Transfers: Sit to/from Stand Sit to Stand: Min guard           General transfer comment: pt performed 4x sit<>stands from EOB to RW, min guard for balance; prolonged seated rest breaks between standing bouts    Ambulation/Gait             Pre-gait activities: side steps towards HOB; 4x bouts of standing for ~15-sec with RW and min guard General Gait Details: pt declined secondary to fatigue   Stairs             Wheelchair Mobility    Modified Rankin (Stroke Patients Only)       Balance Overall balance assessment: Needs assistance Sitting-balance support: No upper extremity supported, Feet supported Sitting balance-Leahy Scale: Fair     Standing balance support: Bilateral upper extremity supported, During functional activity, Single extremity supported Standing balance-Leahy Scale: Poor Standing balance comment: reliant on at least single UE support; required assist for posterior pericare/hygiene while standing                            Cognition Arousal/Alertness: Awake/alert Behavior During Therapy: WFL for tasks assessed/performed, Flat affect Overall Cognitive Status: Within Functional Limits for tasks assessed  Exercises      General Comments General  comments (skin integrity, edema, etc.): pt expressing frustration regarding current health condition and feeling so poorly      Pertinent Vitals/Pain Pain Assessment Pain Assessment: Faces Faces Pain Scale: Hurts a little bit Pain Location: headache Pain Descriptors / Indicators: Headache Pain Intervention(s): Monitored during session, Limited activity within patient's tolerance    Home Living                          Prior Function            PT Goals (current goals can now be found in the care plan section) Progress towards PT goals: Progressing toward goals (slowly)    Frequency    Min 2X/week      PT Plan Frequency needs to be updated    Co-evaluation              AM-PAC PT "6 Clicks" Mobility   Outcome Measure  Help needed turning from your back to your side while in a flat bed without using bedrails?: A Little Help needed moving from lying on your back to sitting on the side of a flat bed without using bedrails?: A Little Help needed moving to and from a bed to a chair (including a wheelchair)?: A Little Help needed standing up from a chair using your arms (e.g., wheelchair or bedside chair)?: A Little Help needed to walk in hospital room?: Total Help needed climbing 3-5 steps with a railing? : Total 6 Click Score: 14    End of Session Equipment Utilized During Treatment: Oxygen Activity Tolerance: Patient tolerated treatment well;Patient limited by fatigue Patient left: in bed;with call bell/phone within reach;with bed alarm set Nurse Communication: Mobility status PT Visit Diagnosis: Other abnormalities of gait and mobility (R26.89);Muscle weakness (generalized) (M62.81)     Time: 2876-8115 PT Time Calculation (min) (ACUTE ONLY): 33 min  Charges:  $Therapeutic Exercise: 8-22 mins $Therapeutic Activity: 8-22 mins                     Mabeline Caras, PT, DPT Acute Rehabilitation Services  Pager [phone] Office  [place] 07/10/2021, 5:30 PM

## 2021-07-10 NOTE — Progress Notes (Addendum)
Patient ID: Debra Saunders, female   DOB: 29-Dec-1945, 76 y.o.   MRN: [id number]    Advanced Heart Failure Rounding Note   Subjective:    Bld cultures 1/5---> No growth.   1/9- EP consulted. Not a candidate for extraction d/t comorbid conditions.  1/11 PICC placed.  1/12 S/P TEE DC/CV with restoration of SR.   Scr 1.28 > 1.37 > 1.54 > 1.45  Na 133 > 130 > 128  Hgb trending down, 8.7 > 8.1 > 7.7 > 7.8  Remains AF. On PenG.   BP soft  Remains very weak. Needs assistance with mobility. No dyspnea at rest or CP.   Continues to complain of dizziness with position changes.   Objective:     Vital Signs:   Temp:  [98.5 F (36.9 C)-99.7 F (37.6 C)] 99.7 F (37.6 C) (01/18 0406) Pulse Rate:  [62-75] 69 (01/18 0406) Resp:  [16-20] 20 (01/18 0406) BP: (93-146)/(38-67) 99/38 (01/18 0406) SpO2:  [94 %-100 %] 99 % (01/18 0406) Weight:  [91.2 kg] 91.2 kg (01/18 0406) Last BM Date: 07/09/21  Weight change: Filed Weights   07/08/21 0500 07/09/21 0630 07/10/21 0406  Weight: 90.6 kg 91.1 kg 91.2 kg    Intake/Output:   Intake/Output Summary (Last 24 hours) at 07/10/2021 0705 Last data filed at 07/09/2021 2114 Gross per 24 hour  Intake 133 ml  Output 400 ml  Net -267 ml     Physical Exam: General: No distress. Lying in bed with CPAP on. Appears fatigued. HEENT: normal Neck: supple. no JVD. Carotids 2+ bilat; no bruits. No lymphadenopathy or thryomegaly appreciated. Cor: PMI nondisplaced. Regular rate & rhythm. No rubs, gallops or murmurs. Lungs: clear Abdomen: soft, nontender, nondistended. No hepatosplenomegaly. No bruits or masses. Good bowel sounds. Extremities: no cyanosis, clubbing, rash, chronic LLE lymphedema, trace edema on right Neuro: alert & orientedx3, cranial nerves grossly intact. moves all 4 extremities w/o difficulty. Affect pleasant     Telemetry: Atrial sensed, V paced, 70s.  Labs: Basic Metabolic Panel: Recent Labs  Lab 07/06/21 1209  07/07/21 0110 07/08/21 0355 07/09/21 0345 07/10/21 0120  NA 133* 133* 131* 130* 128*  K 3.8 3.7 3.7 4.1 4.1  CL 94* 95* 95* 94* 92*  CO2 30 30 31 30 27   GLUCOSE 175* 123* 104* 125* 125*  BUN 13 12 14 16 18   CREATININE 1.24* 1.28* 1.37* 1.54* 1.45*  CALCIUM 7.9* 7.8* 7.8* 7.9* 7.9*    Liver Function Tests: No results for input(s): AST, ALT, ALKPHOS, BILITOT, PROT, ALBUMIN in the last 168 hours.  No results for input(s): LIPASE, AMYLASE in the last 168 hours.  No results for input(s): AMMONIA in the last 168 hours.  CBC: Recent Labs  Lab 07/06/21 0500 07/07/21 0110 07/08/21 0355 07/09/21 0345 07/10/21 0120  WBC 14.0* 13.4* 12.4* 12.9* 12.2*  HGB 8.7* 8.2* 8.1* 7.7* 7.8*  HCT 27.1* 26.2* 25.1* 24.4* 25.1*  MCV 86.6 86.5 87.5 88.1 87.2  PLT 147* 152 159 170 183    Cardiac Enzymes: No results for input(s): CKTOTAL, CKMB, CKMBINDEX, TROPONINI in the last 168 hours.  BNP: BNP (last 3 results) Recent Labs    08/24/20 1506 03/27/21 1530 06/25/21 1350  BNP 51.2 31.1 344.7*    ProBNP (last 3 results) No results for input(s): PROBNP in the last 8760 hours.    Other results:  Imaging: No results found.   Medications:     Scheduled Medications:  sodium chloride   Intravenous Once   amiodarone  200 mg Oral BID   apixaban  5 mg Oral BID   benzonatate  200 mg Oral TID   Chlorhexidine Gluconate Cloth  6 each Topical Daily   cyclopentolate  1 drop Both Eyes QHS   Difluprednate  1 drop Both Eyes BID   digoxin  0.125 mg Oral Daily   DULoxetine  60 mg Oral Daily   gabapentin  600 mg Oral TID   insulin aspart  0-9 Units Subcutaneous TID WC   ipratropium-albuterol  3 mL Nebulization TID   macitentan  10 mg Oral Daily   pantoprazole (PROTONIX) IV  40 mg Intravenous Q24H   polyethylene glycol  17 g Oral BID   rosuvastatin  10 mg Oral Daily   Selexipag  1,600 mcg Oral BID   sildenafil  20 mg Oral TID   sodium chloride flush  10-40 mL Intracatheter Q12H    sodium chloride flush  3 mL Intravenous Q12H   torsemide  20 mg Oral QPM   torsemide  40 mg Oral Q breakfast    Infusions:  sodium chloride     penicillin g continuous IV infusion 8 Million Units (07/09/21 2026)    PRN Medications: sodium chloride, acetaminophen **OR** acetaminophen, albuterol, cholestyramine light, diphenhydrAMINE, EPINEPHrine, guaiFENesin-codeine, hydrALAZINE, HYDROcodone-acetaminophen, morphine injection, phenol, sodium chloride flush   Assessment/Plan:   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  Group G strep bacteremia.  Septic emboli to lungs. Seen by EP, thought to be too high risk for device extraction. Unfortunately she will be at high risk for complications going forward.  - ID following.  - She will need PCN G IV at home x 6 wks to 08/08/20.  Has PICC.  2. Acute on chronic hypoxemic respiratory failure: She developed PNA from septic emboli to lungs.  At baseline, on 4L home oxygen with CPAP at night for interstitial lung disease and OSA.   - Antibiotics as above for PNA.  - She is down to 4 L by [place] (baseline).  3. Pulmonary hypertension: Based on 4/19 cath, she had severe PAH with PVR 7.2 WU. Chronic hypoxemic respiratory failure, on home oxygen.  She has a history of remote DVT (2004) but V/Q scan negative for evidence of chronic PE.  High resolution CT chest in 10/19 showed pulmonary fibrosis, and PFTs in 2017 showed severe restriction.  Serological workup for rheumatological diseases was unremarkable.  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 Amiodarone was stopped in past given lung parenchymal disease. 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  - She is back on sildenafil 20 tid and macitentan 10 daily.  - Selexipag increased back to home dose 1600 bid.   - Continue digoxin for RV support. Dig  level 1.2 yesterday. Hold today then restart tomorrow at 0.0625 mg daily.  - Home oxygen, CPAP.  4. Atrial fibrillation with tachy-brady syndrome:  Had been off amiodarone since 2019 due to interstitial lung disease/pulmonary fibrosis. Amiodarone restarted, now s/p TEE-guided DCCV this admission. A-V paced this morning.  - continue amiodarone to 200 mg daily.  - Continue Eliquis 5 mg bid.   5. AKI: Scr up slightly to 1.54 yesterday. Torsemide decreased to 40 q am/20 q pm. Scr 1.45 today.  6. 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  Volume status looks ok today.  - Decreased torsemide to 40 qam/20 qpm as above. Weight stable. 7. HTN: BP soft today.  Now with orthostatic symptoms.  - Need to use TED hose, discussed with patient again this morning.   - Will add midodrine 5 mg TID 8. H/o DVT: Anticoagulated.  9. Left leg lymphedema: Chronic.  10. OSA: CPAP.  11. Hyponatremia: Na 128 this am - Fluid restrict.  12. Anemia:  FOBT negative 01/12.  Hgb down to 6.8 on 01/13. Received 2 U PRBCs. Hgb 7.7 today.   No overt bleeding.  - Transfuse hgb < 7 - Ferritin elevated (728) but T sat and iron levels low. Recheck iron studies in a month. Discussed with PharmD.  Awaiting SNF for rehab. Insurance auth came through too late yesterday evening to discharge.  Cardiac meds for discharge:  -selexipag/macitentan/sildenafil as prior to admission -Eliquis 5 mg bid -amiodarone 200 mg daily -torsemide 40 qam/20 qpm -Hold digoxin today then restart tomorrow at 0.0625 mg daily -Crestor 10 daily.  -mildodrine 5 mg TID  Has f/u in HF clinic scheduled. Will need CBC, BMET and digoxin level day of f/u   [facility], LINDSAY N 07/10/2021 7:05 AM  Agree with the above PA note.   She is tenuous but I think that she will remain so with  endocarditis involving the TV and pacemaker lead and unable to extract PPM due to high procedural risk.   With soft BP, will add midodrine 5 mg tid.    With elevated digoxin level, hold today and decrease to 0.0625 mg daily.   Continue torsemide 40 qam/20 qpm.   Discharge on the above meds to SNF.  I am afraid she will be at fairly high risk for readmission.   [name] 07/10/2021

## 2021-07-10 NOTE — Telephone Encounter (Signed)
This encounter was created in error - please disregard.

## 2021-07-10 NOTE — Progress Notes (Signed)
Occupational Therapy Treatment Patient Details Name: Debra Saunders MRN: [id number] DOB: [birth year] Today's Date: 07/10/2021   History of present illness Pt is a 76 y.o. admitted 06/25/21 with c/o SOB, fever, chills, abdominal pain. Workup for septic shock secondary to klebsiella UTI and strep bacteremia with GIB, multifocal PNA, possible septic lung emboli. 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 Transfer to [facility] 06/29/21. Per cardiology, not a candidate for transvenous pacemaker lead extraction due to comorbidities. Pt developed afib with RVR; plan for TEE-guided DCCV 1/12. PMH includes HTN, afib, pacemaker, PAH, chronic resp failure (on 4L home O2), CHF, cardiorenal syndrome, DM2, OSA, stroke, ICH, chronic back pain, arthritis.   OT comments  Pt continues to make progress with functional goals. Pt reports that she didn't get much sleep last night but agreeable to OOB activity. OT will continue to follow acutely to maximize level of function and safety   Recommendations for follow up therapy are one component of a multi-disciplinary discharge planning process, led by the attending physician.  Recommendations may be updated based on patient status, additional functional criteria and insurance authorization.    Follow Up Recommendations  Skilled nursing-short term rehab (<3 hours/day)    Assistance Recommended at Discharge Frequent or constant Supervision/Assistance  Patient can return home with the following  A lot of help with bathing/dressing/bathroom;A little help with walking and/or transfers;Assist for transportation;Assistance with cooking/housework   Equipment Recommendations   (TBD at next venue of care)    Recommendations for Other Services      Precautions / Restrictions Precautions Precautions: Fall Precaution Comments: bladder/bowel incontinence, watch HR, wears 4L O2 baseline, fatigues quickly Restrictions Weight  Bearing Restrictions: No       Mobility Bed Mobility Overal bed mobility: Needs Assistance Bed Mobility: Supine to Sit     Supine to sit: Min assist          Transfers Overall transfer level: Needs assistance Equipment used: Rolling walker (2 wheels) Transfers: Sit to/from Stand, Bed to chair/wheelchair/BSC Sit to Stand: Min assist Stand pivot transfers: Min assist, Min guard   Step pivot transfers: Min guard     General transfer comment: pt stood a RW for toileting/posterior hygiene max A     Balance Overall balance assessment: Needs assistance Sitting-balance support: No upper extremity supported, Feet supported Sitting balance-Leahy Scale: Fair     Standing balance support: Bilateral upper extremity supported, During functional activity, Single extremity supported Standing balance-Leahy Scale: Poor                             ADL either performed or assessed with clinical judgement   ADL Overall ADL's : Needs assistance/impaired     Grooming: Wash/dry hands;Wash/dry face;Min guard;Standing               Lower Body Dressing: Moderate assistance   Toilet Transfer: Ambulation;Rolling walker (2 wheels);BSC/3in1;Min guard;Cueing for safety   Toileting- Clothing Manipulation and Hygiene: Maximal assistance;Sit to/from stand Toileting - Clothing Manipulation Details (indicate cue type and reason): max A clothing mgt, total A hygine standing at RW     Functional mobility during ADLs: Rolling walker (2 wheels);Min guard;Cueing for safety General ADL Comments: pt fatigues quickly    Extremity/Trunk Assessment Upper Extremity Assessment Upper Extremity Assessment: Generalized weakness   Lower Extremity Assessment Lower Extremity Assessment: Defer to PT evaluation        Vision Baseline Vision/History: 1 Wears  glasses Ability to See in Adequate Light: 0 Adequate Patient Visual Report: No change from baseline     Perception     Praxis       Cognition Arousal/Alertness: Awake/alert Behavior During Therapy: WFL for tasks assessed/performed Overall Cognitive Status: Within Functional Limits for tasks assessed                                          Exercises      Shoulder Instructions       General Comments      Pertinent Vitals/ Pain       Pain Assessment Pain Assessment: No/denies pain Pain Score: 0-No pain Faces Pain Scale: No hurt Pain Intervention(s): Monitored during session  Home Living                                          Prior Functioning/Environment              Frequency  Min 2X/week        Progress Toward Goals  OT Goals(current goals can now be found in the care plan section)  Progress towards OT goals: Progressing toward goals     Plan Discharge plan remains appropriate;Frequency remains appropriate    Co-evaluation                 AM-PAC OT "6 Clicks" Daily Activity     Outcome Measure   Help from another person eating meals?: None Help from another person taking care of personal grooming?: A Little Help from another person toileting, which includes using toliet, bedpan, or urinal?: A Lot Help from another person bathing (including washing, rinsing, drying)?: A Lot Help from another person to put on and taking off regular upper body clothing?: A Little Help from another person to put on and taking off regular lower body clothing?: A Lot 6 Click Score: 16    End of Session Equipment Utilized During Treatment: Gait belt;Rolling walker (2 wheels);Other (comment) (BSC)  OT Visit Diagnosis: Other abnormalities of gait and mobility (R26.89);Muscle weakness (generalized) (M62.81)   Activity Tolerance Patient limited by fatigue   Patient Left in chair;with call bell/phone within reach;with nursing/sitter in room   Nurse Communication          Time: 5329-9242 OT Time Calculation (min): 20 min  Charges: OT General  Charges $OT Visit: 1 Visit OT Treatments $Therapeutic Activity: 8-22 mins    Britt Bottom 07/10/2021, 12:55 PM

## 2021-07-10 NOTE — Progress Notes (Signed)
PHARMACY CONSULT NOTE FOR:  OUTPATIENT  PARENTERAL ANTIBIOTIC THERAPY (OPAT)  Indication: Group G Streptococcus Bacteremia and Pacemaker Vegetation Regimen: Cefazolin 2g IV q8h End date: 08/08/21  IV antibiotic discharge orders are pended. To discharging provider:  please sign these orders via discharge navigator,  Select New Orders & click on the button choice - Manage This Unsigned Work.     Thank you for allowing pharmacy to be a part of this patient's care.  Lestine Box, PharmD PGY2 Infectious Diseases Pharmacy Resident   Please check [email or web address] for unit-specific pharmacy phone numbers

## 2021-07-10 NOTE — Progress Notes (Signed)
Refused  to eat breakfast and lunch today claiming that she has no appetite to eat and afraid that she might have diarrhea. Encouraged to eat since she is taking medications. And that she needs nourishment.[name]

## 2021-07-11 DIAGNOSIS — I5032 Chronic diastolic (congestive) heart failure: Secondary | ICD-10-CM

## 2021-07-11 DIAGNOSIS — E669 Obesity, unspecified: Secondary | ICD-10-CM

## 2021-07-11 DIAGNOSIS — G4733 Obstructive sleep apnea (adult) (pediatric): Secondary | ICD-10-CM

## 2021-07-11 LAB — BASIC METABOLIC PANEL
Anion gap: 9 (ref 5–15)
BUN: 16 mg/dL (ref 8–23)
CO2: 26 mmol/L (ref 22–32)
Calcium: 7.7 mg/dL — ABNORMAL LOW (ref 8.9–10.3)
Chloride: 95 mmol/L — ABNORMAL LOW (ref 98–111)
Creatinine, Ser: 1.36 mg/dL — ABNORMAL HIGH (ref 0.44–1.00)
GFR, Estimated: 40 mL/min — ABNORMAL LOW (ref >60)
Glucose, Bld: 108 mg/dL — ABNORMAL HIGH (ref 70–99)
Potassium: 3.9 mmol/L (ref 3.5–5.1)
Sodium: 130 mmol/L — ABNORMAL LOW (ref 135–145)

## 2021-07-11 LAB — GLUCOSE, CAPILLARY
Glucose-Capillary: 104 mg/dL — ABNORMAL HIGH (ref 70–99)
Glucose-Capillary: 113 mg/dL — ABNORMAL HIGH (ref 70–99)
Glucose-Capillary: 112 mg/dL — ABNORMAL HIGH (ref 70–99)
Glucose-Capillary: 121 mg/dL — ABNORMAL HIGH (ref 70–99)

## 2021-07-11 LAB — CBC
HCT: 23 % — ABNORMAL LOW (ref 36.0–46.0)
Hemoglobin: 7.2 g/dL — ABNORMAL LOW (ref 12.0–15.0)
MCH: 27.4 pg (ref 26.0–34.0)
MCHC: 31.3 g/dL (ref 30.0–36.0)
MCV: 87.5 fL (ref 80.0–100.0)
Platelets: 170 10*3/uL (ref 150–400)
RBC: 2.63 MIL/uL — ABNORMAL LOW (ref 3.87–5.11)
RDW: 14.4 % (ref 11.5–15.5)
WBC: 11 10*3/uL — ABNORMAL HIGH (ref 4.0–10.5)
nRBC: 0 % (ref 0.0–0.2)

## 2021-07-11 LAB — PREPARE RBC (CROSSMATCH)

## 2021-07-11 MED ORDER — TORSEMIDE 20 MG PO TABS
40.0000 mg | ORAL_TABLET | Freq: Two times a day (BID) | ORAL | Status: DC
  Administered 2021-07-11 – 2021-07-12 (×3): 40 mg via ORAL
  Filled 2021-07-11 (×3): qty 2

## 2021-07-11 MED ORDER — SODIUM CHLORIDE 0.9% IV SOLUTION: Freq: Once | INTRAVENOUS | Status: DC

## 2021-07-11 NOTE — Progress Notes (Signed)
Patient ID: Debra Saunders, female   DOB: February 27, 1946, 76 y.o.   MRN: [id number]    Advanced Heart Failure Rounding Note   Subjective:    Bld cultures 1/5---> No growth.   1/9- EP consulted. Not a candidate for extraction d/t comorbid conditions.  1/11 PICC placed.  1/12 S/P TEE DC/CV with restoration of SR.   Scr 1.28 > 1.37 > 1.54 > 1.45 > 1.36  Hgb trending down, 8.7 > 8.1 > 7.7 > 7.8 > 7.2, no overt bleeding.   Afebrile, transitioned to cefazolin.   Remains very weak. Needs assistance with mobility. Short of breath walking in room.    Objective:     Vital Signs:   Temp:  [98.4 F (36.9 C)-100.2 F (37.9 C)] 98.4 F (36.9 C) (01/19 0733) Pulse Rate:  [64-74] 64 (01/19 0733) Resp:  [14-23] 14 (01/19 0733) BP: (99-138)/(35-55) 138/55 (01/19 0733) SpO2:  [93 %-100 %] 100 % (01/19 0841) Weight:  [90.9 kg] 90.9 kg (01/19 0500) Last BM Date: 07/10/21  Weight change: Filed Weights   07/09/21 0630 07/10/21 0406 07/11/21 0500  Weight: 91.1 kg 91.2 kg 90.9 kg    Intake/Output:   Intake/Output Summary (Last 24 hours) at 07/11/2021 1036 Last data filed at 07/11/2021 1000 Gross per 24 hour  Intake 310 ml  Output 2050 ml  Net -1740 ml     Physical Exam: General: NAD Neck: JVP 8 cm, no thyromegaly or thyroid nodule.  Lungs: Clear to auscultation bilaterally with normal respiratory effort. CV: Nondisplaced PMI.  Heart regular S1/S2, no S3/S4, no murmur.  No peripheral edema.   Abdomen: Soft, nontender, no hepatosplenomegaly, no distention.  Skin: Intact without lesions or rashes.  Neurologic: Alert and oriented x 3.  Psych: Normal affect. Extremities: No clubbing or cyanosis.  HEENT: Normal.   Telemetry: Atrial sensed, V paced, 70s.  Labs: Basic Metabolic Panel: Recent Labs  Lab 07/07/21 0110 07/08/21 0355 07/09/21 0345 07/10/21 0120 07/11/21 0139  NA 133* 131* 130* 128* 130*  K 3.7 3.7 4.1 4.1 3.9  CL 95* 95* 94* 92* 95*  CO2 30 31 30 27 26   GLUCOSE  123* 104* 125* 125* 108*  BUN 12 14 16 18 16   CREATININE 1.28* 1.37* 1.54* 1.45* 1.36*  CALCIUM 7.8* 7.8* 7.9* 7.9* 7.7*    Liver Function Tests: No results for input(s): AST, ALT, ALKPHOS, BILITOT, PROT, ALBUMIN in the last 168 hours.  No results for input(s): LIPASE, AMYLASE in the last 168 hours.  No results for input(s): AMMONIA in the last 168 hours.  CBC: Recent Labs  Lab 07/07/21 0110 07/08/21 0355 07/09/21 0345 07/10/21 0120 07/11/21 0139  WBC 13.4* 12.4* 12.9* 12.2* 11.0*  HGB 8.2* 8.1* 7.7* 7.8* 7.2*  HCT 26.2* 25.1* 24.4* 25.1* 23.0*  MCV 86.5 87.5 88.1 87.2 87.5  PLT 152 159 170 183 170    Cardiac Enzymes: No results for input(s): CKTOTAL, CKMB, CKMBINDEX, TROPONINI in the last 168 hours.  BNP: BNP (last 3 results) Recent Labs    08/24/20 1506 03/27/21 1530 06/25/21 1350  BNP 51.2 31.1 344.7*    ProBNP (last 3 results) No results for input(s): PROBNP in the last 8760 hours.    Other results:  Imaging: No results found.   Medications:     Scheduled Medications:  sodium chloride   Intravenous Once   sodium chloride   Intravenous Once   amiodarone  200 mg Oral Daily   apixaban  5 mg Oral BID   benzonatate  200 mg Oral TID   Chlorhexidine Gluconate Cloth  6 each Topical Daily   cyclopentolate  1 drop Both Eyes QHS   Difluprednate  1 drop Both Eyes BID   digoxin  0.0625 mg Oral Daily   DULoxetine  60 mg Oral Daily   gabapentin  600 mg Oral TID   insulin aspart  0-9 Units Subcutaneous TID WC   ipratropium-albuterol  3 mL Nebulization BID   macitentan  10 mg Oral Daily   midodrine  5 mg Oral TID WC   pantoprazole (PROTONIX) IV  40 mg Intravenous Q24H   polyethylene glycol  17 g Oral BID   rosuvastatin  10 mg Oral Daily   Selexipag  1,600 mcg Oral BID   sildenafil  20 mg Oral TID   sodium chloride flush  10-40 mL Intracatheter Q12H   sodium chloride flush  3 mL Intravenous Q12H   torsemide  40 mg Oral BID    Infusions:  sodium  chloride      ceFAZolin (ANCEF) IV 2 g (07/11/21 0611)    PRN Medications: sodium chloride, acetaminophen **OR** acetaminophen, albuterol, cholestyramine light, diphenhydrAMINE, EPINEPHrine, guaiFENesin-codeine, hydrALAZINE, HYDROcodone-acetaminophen, morphine injection, phenol, sodium chloride flush   Assessment/Plan:   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  Group G strep bacteremia.  Septic emboli to lungs. Seen by EP, thought to be too high risk for device extraction. Unfortunately she will be at high risk for complications going forward.  - ID following.  - She will need cefazolin IV at home x 6 wks to 08/08/20.  Has PICC.  2. Acute on chronic hypoxemic respiratory failure: She developed PNA from septic emboli to lungs.  At baseline, on 4L home oxygen with CPAP at night for interstitial lung disease and OSA.   - Antibiotics as above for PNA.  - She is down to 4 L by [place] (baseline).  3. Pulmonary hypertension: Based on 4/19 cath, she had severe PAH with PVR 7.2 WU. Chronic hypoxemic respiratory failure, on home oxygen.  She has a history of remote DVT (2004) but V/Q scan negative for evidence of chronic PE.  High resolution CT chest in 10/19 showed pulmonary fibrosis, and PFTs in 2017 showed severe restriction.  Serological workup for rheumatological diseases was unremarkable.  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 Amiodarone was stopped in past given lung parenchymal disease. 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  - She is back on sildenafil 20 tid and macitentan 10 daily.  - Selexipag increased back to home dose 1600 bid.   - Continue digoxin for RV support, decreased to 0.0625 daily.  - Home oxygen, CPAP.  4. Atrial fibrillation with tachy-brady syndrome:  Had been off amiodarone since 2019 due to interstitial lung  disease/pulmonary fibrosis. Amiodarone restarted, now s/p TEE-guided DCCV this admission. A-V paced this morning.  - continue amiodarone to 200 mg daily.  - Continue Eliquis 5 mg bid.   5. AKI: Scr up slightly to 1.54 yesterday. Torsemide decreased to 40 q am/20 q pm. Scr 1.45 today.  6. 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  Probably mild volume overload and creatinine stable.  - Increase torsemide back to 40 mg bid.  7.  HTN: Has been low here with orthostasis, started on midodrine and stable today.  - Need to use TED hose  - Continue midodrine 5 mg TID 8. H/o DVT: Anticoagulated.  9. Left leg lymphedema: Chronic.  10. OSA: CPAP.  11. Hyponatremia: Na 130 this am - Fluid restrict.  12. Anemia:  FOBT negative 01/12.  Hgb down to 6.8 on 01/13. Received 2 U PRBCs. Hgb 7.2 today with slow drop.   No overt bleeding.  - Will give 1 unit PRBCs today since she is going to rehab soon.  - Ferritin elevated (728) but T sat and iron levels low. Recheck iron studies in a month. Discussed with PharmD.  Awaiting SNF for rehab. Still having issues with this.  Cardiac meds for discharge:   -selexipag/macitentan/sildenafil as prior to admission -Eliquis 5 mg bid -amiodarone 200 mg daily -torsemide 40 bid -digoxin 0.0625 mg daily -Crestor 10 daily.  -mildodrine 5 mg TID  Has f/u in HF clinic scheduled. Will need CBC, BMET and digoxin level day of f/u  [name] 07/11/2021 10:36 AM

## 2021-07-11 NOTE — Progress Notes (Signed)
Occupational Therapy Treatment Patient Details Name: BLAYNE GARLICK MRN: [id number] DOB: 06/25/1945 Today's Date: 07/11/2021   History of present illness Pt is a 76 y.o. admitted 06/25/21 with c/o SOB, fever, chills, abdominal pain. Workup for septic shock secondary to klebsiella UTI and strep bacteremia with GIB, multifocal PNA, possible septic lung emboli. 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 Transfer to [facility] 06/29/21. Per cardiology, not a candidate for transvenous pacemaker lead extraction due to comorbidities. Pt developed afib with RVR; had DCCV 1/12. PMH includes HTN, afib, pacemaker, PAH, chronic resp failure (on 4L home O2), CHF, cardiorenal syndrome, DM2, OSA, stroke, ICH, chronic back pain, arthritis.   OT comments  Patient received in bed and stated she was not feeling well but agreeable to work with OT. Patient was supervision to get to EOB and asked to use BSC. Patient was min guard for transfer and max assist for hygiene. Patient returned to EOB following and performed static standing. Patient asked to return to supine due to fatigue and headache. Acute OT to continue to follow.    Recommendations for follow up therapy are one component of a multi-disciplinary discharge planning process, led by the attending physician.  Recommendations may be updated based on patient status, additional functional criteria and insurance authorization.    Follow Up Recommendations  Skilled nursing-short term rehab (<3 hours/day)    Assistance Recommended at Discharge Frequent or constant Supervision/Assistance  Patient can return home with the following  A lot of help with bathing/dressing/bathroom;A little help with walking and/or transfers;Assist for transportation;Assistance with cooking/housework   Equipment Recommendations  None recommended by OT    Recommendations for Other Services      Precautions / Restrictions  Precautions Precautions: Fall;Other (comment) Precaution Comments: bladder/bowel incontinence, watch HR, wears 4L O2 baseline, fatigues quickly Restrictions Weight Bearing Restrictions: No       Mobility Bed Mobility Overal bed mobility: Needs Assistance Bed Mobility: Supine to Sit, Sit to Supine     Supine to sit: Supervision, HOB elevated Sit to supine: Min assist   General bed mobility comments: required assistance with BLE to get back to supine    Transfers Overall transfer level: Needs assistance Equipment used: Rolling walker (2 wheels) Transfers: Sit to/from Stand Sit to Stand: Min guard     Step pivot transfers: Min guard     General transfer comment: bed to [facility] transfer     Balance Overall balance assessment: Needs assistance Sitting-balance support: No upper extremity supported, Feet supported Sitting balance-Leahy Scale: Fair     Standing balance support: Bilateral upper extremity supported, During functional activity, Single extremity supported Standing balance-Leahy Scale: Poor Standing balance comment: reliant on assistive device for balance                           ADL either performed or assessed with clinical judgement   ADL Overall ADL's : Needs assistance/impaired                         Toilet Transfer: Ambulation;Rolling walker (2 wheels);BSC/3in1;Min guard;Cueing for safety Toilet Transfer Details (indicate cue type and reason): bed to [facility] transfer Toileting- Clothing Manipulation and Hygiene: Maximal assistance;Sit to/from stand Toileting - Clothing Manipulation Details (indicate cue type and reason): max assist with toilet hygiene while standing       General ADL Comments: unable to perform toilet hygien    Extremity/Trunk  Assessment              Vision       Perception     Praxis      Cognition Arousal/Alertness: Awake/alert Behavior During Therapy: WFL for tasks assessed/performed, Flat  affect Overall Cognitive Status: Within Functional Limits for tasks assessed                                 General Comments: followed commands and alert        Exercises      Shoulder Instructions       General Comments      Pertinent Vitals/ Pain       Pain Assessment Pain Assessment: Faces Faces Pain Scale: Hurts a little bit Pain Location: headache Pain Descriptors / Indicators: Headache Pain Intervention(s): Patient requesting pain meds-RN notified, Limited activity within patient's tolerance  Home Living                                          Prior Functioning/Environment              Frequency  Min 2X/week        Progress Toward Goals  OT Goals(current goals can now be found in the care plan section)  Progress towards OT goals: Progressing toward goals  Acute Rehab OT Goals Patient Stated Goal: feel better OT Goal Formulation: With patient Time For Goal Achievement: 07/15/21 ADL Goals Pt Will Perform Grooming: with set-up;with supervision;standing Pt Will Perform Upper Body Bathing: with set-up;with modified independence Pt Will Perform Lower Body Bathing: with min assist;with min guard assist Pt Will Perform Upper Body Dressing: with set-up;with modified independence Pt Will Perform Lower Body Dressing: with min assist Pt Will Transfer to Toilet: with min guard assist;with supervision;ambulating Pt Will Perform Toileting - Clothing Manipulation and hygiene: with min assist;with min guard assist;sit to/from stand  Plan Discharge plan remains appropriate;Frequency remains appropriate    Co-evaluation                 AM-PAC OT "6 Clicks" Daily Activity     Outcome Measure   Help from another person eating meals?: None Help from another person taking care of personal grooming?: A Little Help from another person toileting, which includes using toliet, bedpan, or urinal?: A Lot Help from another person  bathing (including washing, rinsing, drying)?: A Lot Help from another person to put on and taking off regular upper body clothing?: A Little Help from another person to put on and taking off regular lower body clothing?: A Lot 6 Click Score: 16    End of Session Equipment Utilized During Treatment: Rolling walker (2 wheels);Oxygen  OT Visit Diagnosis: Other abnormalities of gait and mobility (R26.89);Muscle weakness (generalized) (M62.81)   Activity Tolerance Patient limited by fatigue   Patient Left in bed;with call bell/phone within reach   Nurse Communication Patient requests pain meds        Time: 1459-1520 OT Time Calculation (min): 21 min  Charges: OT General Charges $OT Visit: 1 Visit OT Treatments $Self Care/Home Management : 8-22 mins  Lodema Hong, [place]  Pager [phone] Office [place] 07/11/2021, 3:29 PM

## 2021-07-11 NOTE — Progress Notes (Signed)
Mobility Specialist Progress Note    07/11/21 1733  Mobility  Bed Position Chair  Activity Transferred from bed to chair  Level of Assistance Contact guard assist, steadying assist  Assistive Device Four wheel walker  Distance Ambulated (ft) 3 ft  Activity Response Tolerated fair  $Mobility charge 1 Mobility   Pt received in bed and agreeable. Had diarrhea upon standing. Used BSC. Left in chair with call bell in reach.   [facility] Mobility Specialist  M.S. 2C and 6E: [phone] M.S. 4E: [phone]

## 2021-07-11 NOTE — TOC Progression Note (Addendum)
Transition of Care [facility]) - Progression Note    Patient Details  Name: Debra Saunders MRN: [id number] Date of Birth: 11/09/1945  Transition of Care [facility]) CM/SW [place], LCSW Phone Number: 07/11/2021, 9:29 AM  Clinical Narrative:    HF CSW called [employer] [phone] ext [phone] to speak with a Lorita Officer about the insurance authorization that needed to be modified to change the SNF facility from [facility] to [facility], however the CSW had to leave a voicemail for her to return the call.  11:45am - HF CSW again called [employer] with no answer regarding the modified insurance authorization.  12:10pm - HF CSW left a voicemail for Tammy at [facility] [phone] to start the insurance authorization again as the CSW didn't have any luck modifying the previous insurance authorization with [facility]. CSW will continue to outreach Tammy at [facility]. 12:31pm - HF CSW spoke with Tammy who confirmed she will start insurance authorization with [facility].   3:45pm - HF CSW spoke with Dr. [place] Pink who reported they they chanced the IV medication penicillin to Ancef instead and the CSW reached out to Tammy at [facility] to verify this change would be okay for the facility. Tammy confirmed this would not be a problem. Insurance authorization is still pending for [facility].  CSW will continue to follow throughout discharge.   Expected Discharge Plan: Skilled Nursing Facility Barriers to Discharge: No Barriers Identified  Expected Discharge Plan and Services Expected Discharge Plan: [place] In-house Referral: Clinical Social Work Discharge Planning Services: CM Consult Post Acute Care Choice: [place] arrangements for the past 2 months: Single Family Home Expected Discharge Date: 07/09/21                                     Social Determinants of Health (SDOH) Interventions Food Insecurity Interventions: Intervention Not Indicated Financial Strain  Interventions: Intervention Not Indicated Housing Interventions: Intervention Not Indicated Transportation Interventions: [employer], Other (Comment) (family and friends also help provide transportation)  Readmission Risk Interventions No flowsheet data found.

## 2021-07-11 NOTE — Progress Notes (Signed)
Progress Note   Patient: Debra Saunders YHC:[id number] DOB: 1945/08/29 DOA: 06/29/2021     12 DOS: the patient was seen and examined on 07/11/2021   Brief hospital course: 76 year old female with hypertension, hyperlipidemia, diabetes, SDH 2014, SA node dysfunction status post pacemaker, OSA, PAH, interstitial lung disease on chronic 4 L O2, diastolic CHF, A. fib on Eliquis, CKD stage III AAA, cardiorenal syndrome, DVT, initially presented to [facility] ED on 06/25/2021 with shortness of breath, cough, fevers, chills dysuria and right upper quadrant pain.   Patient was admitted with severe sepsis, septic shock secondary to multifocal pneumonia, septic emboli and strep bacteremia, Klebsiella UTI.  Initially required Levophed, stress dose steroids for short time.  TEE 1/6 consistent with infected pacemaker lead with vegetation.  Patient also required 1 unit of packed RBCs for hemoglobin of 7, no plans for endoscopy. Acute on chronic CKD has improved.  LFTs has improved (shock liver)  Patient was transferred to [facility] on 1/7 for pacer extraction, EP cardiology and cardiology were consulted, not considered a candidate for transvenous lead extraction due to significant comorbidities.  Patient also developed A. fib with RVR and was started on amiodarone. ID is following, will need prolonged course of antibiotic treatment.  Initially patient was placed on a continuous penicillin infusion.  Plan was for patient to go to skilled nursing however, patient felt to require too much acute care.  Infectious disease reevaluated patient on 1/18 unchanged penicillin continuous instead to IV Ancef every 8 hours.  Assessment and Plan * Severe sepsis with septic shock secondary to infected pacemaker with streptoccos bacteremia - (present on admission) - Blood cultures 1/03 + Streptococcus group G -TEE 1/6 large mobile vegetation on the RV pacing wire noted in the right atrium with estimated 1x 1.5 cm in size or  larger -Blood cultures 1/5 no growth -Septic shock physiology resolved, received IV Levophed and fluids. Cardiology, ID following. -Per EP cardiology, not a candidate for transvenous lead extraction because of significant comorbid conditions. 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   -Patient passed amoxicillin challenge. Per ID start penicillin G to complete 6 weeks of antibiotics, last day of treatment on 08/08/2021.  After skilled nursing declined to take patient, antibiotics changed to IV Ancef every 8 hours.  Completion date still 08/08/2021 and this will be followed by suppressive therapy with amoxicillin.  Outpatient follow-up with ID.   Abdominal pain- (present on admission) - Unclear etiology, CT abdomen on 06/24/2021 negative for acute abdominal pathology.  Right abd  US showed no acute abnormality, history of cholecystectomy -Improved, tolerating diet  Pressure injury of skin- (present on admission) Pressure Injury 06/29/21 Right Stage 1 -  Intact skin with non-blanchable redness of a localized area usually over a bony prominence. abrasion on right gluteal fold (Active)  06/29/21 1500  Location:   Location Orientation: Right  Staging: Stage 1 -  Intact skin with non-blanchable redness of a localized area usually over a bony prominence.  Wound Description (Comments): abrasion on right gluteal fold  Present on Admission: Yes    klebsiella pneumoniae UTI- (present on admission) - Completed course of antibiotics  Acute blood loss anemia- (present on admission) Baseline hgb: 9-10.  History of anemia of chronic disease  --GI was consulted, deferred endoscopy  -Received 2 units packed RBCs on 1/13 for hemoglobin 6.8   - FOBT 1/12 negative. -H&H stable  DVT (deep venous thrombosis) ([place])- (present on admission) -History of PE  and DVT  -Initially Eliquis was placed on hold due to ABLA, now resumed -IVC filter was placed in 2015 at the  time of pacemaker placement -H&H stable, continue eliquis  Acute renal failure superimposed on stage 3a chronic kidney disease ([place])- (present on admission) -Creatinine peaked at 3.21, creatinine today at 1.36 -Creatinine slightly trending up, decreased to torsemide.  Monitor renal function  Chronic diastolic CHF (congestive heart failure) ([place])- (present on admission) -2D echo 06/27/21: EF of 60-65% normal LVF. Diastolic parameters indeterminate -TEE showed EF of 55 to 60%, RV normal, mild to moderate MR -Creatinine slightly trending up, decreased torsemide to 40 mg in the morning 20 mg p.m.  Obesity (BMI 30-39.9)- (present on admission) BMI 39.7.  Healthy lifestyle recommended.  Atrial fibrillation with tachy brady syndrome - (present on admission) -Patient was started on IV amiodarone on 1/8 due to atrial fibrillation with RVR.  Failed rate control with oral Cardizem. -Was on amiodarone but was stopped in 2019 due to lung disease.  Continue digoxin -Underwent successful DCCV.  TEE showed vegetation still present on pacemaker lead in RA and tricuspid valve -Continue eliquis, no acute issues  Acute on chronic respiratory failure with hypoxia (HCC)- (present on admission) -Chronic respiratory failure secondary to ILD, pulmonary hypertension on 4L oxygen, originally required BiPAP, now back to baseline -Continue CPAP qhs for OSA   OSA (obstructive sleep apnea)- (present on admission) - Continue CPAP nightly  Hyperlipidemia- (present on admission) - Continue Crestor, LFTs normal  Pulmonary hypertension, unspecified ([place])- (present on admission) - Cardiology following, resumed sildenafil, macitentan, selexipag - Continue digoxin, on Eliquis, CPAP and home O2  Diabetes (HCC) -Hemoglobin A1c of 5.4 on 06/25/2021 -Well-controlled, CBGs likely elevated in the setting of severe sepsis and steroids, continue sliding scale insulin -Place on SGLT2 inhibitor prior to discharge per  cardiology    Hypertension- (present on admission) - BP currently stable     Subjective: Patient doing okay, no complaints.  Objective BP (!) 133/55    Pulse 69    Temp 99.1 F (37.3 C) (Oral)    Resp 20    Ht 5' (1.524 m)    Wt 90.9 kg    SpO2 96%    BMI 39.14 kg/m   Physical Exam General: Alert and oriented x 3, no acute distress Cardiovascular: S1 S2 clear, RRR.  Respiratory: Clear to auscultation bilaterally Gastrointestinal: Soft, nontender, nondistended, positive bowel sounds Ext: No clubbing cyanosis or edema  Data Reviewed: Rview of labs note continued improvement in sodium, white blood cell count and creatinine.  Hemoglobin with slight drop, today at 7.2.  Family Communication: Left message for family  Disposition: Status is: Inpatient  Remains inpatient appropriate because: Medically stable, awaiting skilled nursing facility  DVT prophylaxis: On apixaban    Time spent: 25 minutes  Author: Annita Brod, MD 07/11/2021 3:40 PM  For on call review [email or web address].

## 2021-07-11 NOTE — Plan of Care (Signed)

## 2021-07-11 NOTE — Care Management Important Message (Signed)
Important Message  Patient Details  Name: Debra Saunders MRN: [id number] Date of Birth: 02/08/1946   Medicare Important Message Given:  Yes     Orbie Pyo 07/11/2021, 2:24 PM

## 2021-07-12 DIAGNOSIS — I110 Hypertensive heart disease with heart failure: Secondary | ICD-10-CM

## 2021-07-12 DIAGNOSIS — A419 Sepsis, unspecified organism: Secondary | ICD-10-CM | POA: Diagnosis not present

## 2021-07-12 DIAGNOSIS — I272 Pulmonary hypertension, unspecified: Secondary | ICD-10-CM | POA: Diagnosis not present

## 2021-07-12 DIAGNOSIS — R109 Unspecified abdominal pain: Secondary | ICD-10-CM

## 2021-07-12 DIAGNOSIS — J810 Acute pulmonary edema: Secondary | ICD-10-CM | POA: Diagnosis not present

## 2021-07-12 DIAGNOSIS — J8410 Pulmonary fibrosis, unspecified: Secondary | ICD-10-CM | POA: Diagnosis not present

## 2021-07-12 DIAGNOSIS — I10 Essential (primary) hypertension: Secondary | ICD-10-CM | POA: Diagnosis not present

## 2021-07-12 DIAGNOSIS — R5382 Chronic fatigue, unspecified: Secondary | ICD-10-CM | POA: Diagnosis not present

## 2021-07-12 DIAGNOSIS — E1122 Type 2 diabetes mellitus with diabetic chronic kidney disease: Secondary | ICD-10-CM | POA: Diagnosis not present

## 2021-07-12 DIAGNOSIS — J9620 Acute and chronic respiratory failure, unspecified whether with hypoxia or hypercapnia: Secondary | ICD-10-CM | POA: Diagnosis not present

## 2021-07-12 DIAGNOSIS — I159 Secondary hypertension, unspecified: Secondary | ICD-10-CM | POA: Diagnosis not present

## 2021-07-12 DIAGNOSIS — I330 Acute and subacute infective endocarditis: Secondary | ICD-10-CM | POA: Diagnosis not present

## 2021-07-12 DIAGNOSIS — R059 Cough, unspecified: Secondary | ICD-10-CM | POA: Diagnosis not present

## 2021-07-12 DIAGNOSIS — I5032 Chronic diastolic (congestive) heart failure: Secondary | ICD-10-CM | POA: Diagnosis not present

## 2021-07-12 DIAGNOSIS — I38 Endocarditis, valve unspecified: Secondary | ICD-10-CM | POA: Diagnosis not present

## 2021-07-12 DIAGNOSIS — R0989 Other specified symptoms and signs involving the circulatory and respiratory systems: Secondary | ICD-10-CM | POA: Diagnosis not present

## 2021-07-12 DIAGNOSIS — Z20822 Contact with and (suspected) exposure to covid-19: Secondary | ICD-10-CM | POA: Diagnosis not present

## 2021-07-12 DIAGNOSIS — A409 Streptococcal sepsis, unspecified: Secondary | ICD-10-CM | POA: Diagnosis not present

## 2021-07-12 DIAGNOSIS — Z86718 Personal history of other venous thrombosis and embolism: Secondary | ICD-10-CM

## 2021-07-12 DIAGNOSIS — I130 Hypertensive heart and chronic kidney disease with heart failure and stage 1 through stage 4 chronic kidney disease, or unspecified chronic kidney disease: Secondary | ICD-10-CM | POA: Diagnosis not present

## 2021-07-12 DIAGNOSIS — I4891 Unspecified atrial fibrillation: Secondary | ICD-10-CM | POA: Diagnosis not present

## 2021-07-12 DIAGNOSIS — I480 Paroxysmal atrial fibrillation: Secondary | ICD-10-CM | POA: Diagnosis not present

## 2021-07-12 DIAGNOSIS — J189 Pneumonia, unspecified organism: Secondary | ICD-10-CM | POA: Diagnosis not present

## 2021-07-12 DIAGNOSIS — Z452 Encounter for adjustment and management of vascular access device: Secondary | ICD-10-CM | POA: Diagnosis not present

## 2021-07-12 DIAGNOSIS — E669 Obesity, unspecified: Secondary | ICD-10-CM | POA: Diagnosis not present

## 2021-07-12 DIAGNOSIS — I495 Sick sinus syndrome: Secondary | ICD-10-CM | POA: Diagnosis not present

## 2021-07-12 DIAGNOSIS — R300 Dysuria: Secondary | ICD-10-CM | POA: Diagnosis not present

## 2021-07-12 DIAGNOSIS — B961 Klebsiella pneumoniae [K. pneumoniae] as the cause of diseases classified elsewhere: Secondary | ICD-10-CM

## 2021-07-12 DIAGNOSIS — I517 Cardiomegaly: Secondary | ICD-10-CM | POA: Diagnosis not present

## 2021-07-12 DIAGNOSIS — R29898 Other symptoms and signs involving the musculoskeletal system: Secondary | ICD-10-CM | POA: Diagnosis not present

## 2021-07-12 DIAGNOSIS — K219 Gastro-esophageal reflux disease without esophagitis: Secondary | ICD-10-CM | POA: Diagnosis not present

## 2021-07-12 DIAGNOSIS — G4733 Obstructive sleep apnea (adult) (pediatric): Secondary | ICD-10-CM | POA: Diagnosis not present

## 2021-07-12 DIAGNOSIS — I509 Heart failure, unspecified: Secondary | ICD-10-CM | POA: Diagnosis not present

## 2021-07-12 DIAGNOSIS — R7881 Bacteremia: Secondary | ICD-10-CM | POA: Diagnosis not present

## 2021-07-12 DIAGNOSIS — N183 Chronic kidney disease, stage 3 unspecified: Secondary | ICD-10-CM | POA: Diagnosis not present

## 2021-07-12 DIAGNOSIS — I5030 Unspecified diastolic (congestive) heart failure: Secondary | ICD-10-CM | POA: Diagnosis not present

## 2021-07-12 DIAGNOSIS — I890 Lymphedema, not elsewhere classified: Secondary | ICD-10-CM | POA: Diagnosis not present

## 2021-07-12 DIAGNOSIS — E611 Iron deficiency: Secondary | ICD-10-CM | POA: Diagnosis not present

## 2021-07-12 DIAGNOSIS — R7309 Other abnormal glucose: Secondary | ICD-10-CM | POA: Diagnosis not present

## 2021-07-12 DIAGNOSIS — R7989 Other specified abnormal findings of blood chemistry: Secondary | ICD-10-CM | POA: Diagnosis not present

## 2021-07-12 DIAGNOSIS — J9601 Acute respiratory failure with hypoxia: Secondary | ICD-10-CM | POA: Diagnosis not present

## 2021-07-12 DIAGNOSIS — Z8673 Personal history of transient ischemic attack (TIA), and cerebral infarction without residual deficits: Secondary | ICD-10-CM | POA: Diagnosis not present

## 2021-07-12 DIAGNOSIS — Z79899 Other long term (current) drug therapy: Secondary | ICD-10-CM | POA: Diagnosis not present

## 2021-07-12 DIAGNOSIS — R0902 Hypoxemia: Secondary | ICD-10-CM | POA: Diagnosis not present

## 2021-07-12 DIAGNOSIS — N390 Urinary tract infection, site not specified: Secondary | ICD-10-CM | POA: Diagnosis not present

## 2021-07-12 DIAGNOSIS — Z7901 Long term (current) use of anticoagulants: Secondary | ICD-10-CM | POA: Diagnosis not present

## 2021-07-12 DIAGNOSIS — Z9981 Dependence on supplemental oxygen: Secondary | ICD-10-CM | POA: Diagnosis not present

## 2021-07-12 DIAGNOSIS — R42 Dizziness and giddiness: Secondary | ICD-10-CM | POA: Diagnosis not present

## 2021-07-12 DIAGNOSIS — T827XXA Infection and inflammatory reaction due to other cardiac and vascular devices, implants and grafts, initial encounter: Secondary | ICD-10-CM | POA: Diagnosis not present

## 2021-07-12 DIAGNOSIS — E785 Hyperlipidemia, unspecified: Secondary | ICD-10-CM | POA: Diagnosis not present

## 2021-07-12 DIAGNOSIS — J9621 Acute and chronic respiratory failure with hypoxia: Secondary | ICD-10-CM | POA: Diagnosis not present

## 2021-07-12 DIAGNOSIS — J9611 Chronic respiratory failure with hypoxia: Secondary | ICD-10-CM | POA: Diagnosis not present

## 2021-07-12 DIAGNOSIS — Z7401 Bed confinement status: Secondary | ICD-10-CM | POA: Diagnosis not present

## 2021-07-12 DIAGNOSIS — I959 Hypotension, unspecified: Secondary | ICD-10-CM | POA: Diagnosis not present

## 2021-07-12 DIAGNOSIS — Z950 Presence of cardiac pacemaker: Secondary | ICD-10-CM | POA: Diagnosis not present

## 2021-07-12 DIAGNOSIS — D631 Anemia in chronic kidney disease: Secondary | ICD-10-CM | POA: Diagnosis not present

## 2021-07-12 DIAGNOSIS — E538 Deficiency of other specified B group vitamins: Secondary | ICD-10-CM | POA: Diagnosis not present

## 2021-07-12 DIAGNOSIS — R6521 Severe sepsis with septic shock: Secondary | ICD-10-CM | POA: Diagnosis not present

## 2021-07-12 DIAGNOSIS — N179 Acute kidney failure, unspecified: Secondary | ICD-10-CM | POA: Diagnosis not present

## 2021-07-12 DIAGNOSIS — U071 COVID-19: Secondary | ICD-10-CM | POA: Diagnosis not present

## 2021-07-12 DIAGNOSIS — B955 Unspecified streptococcus as the cause of diseases classified elsewhere: Secondary | ICD-10-CM | POA: Diagnosis not present

## 2021-07-12 DIAGNOSIS — J449 Chronic obstructive pulmonary disease, unspecified: Secondary | ICD-10-CM | POA: Diagnosis not present

## 2021-07-12 DIAGNOSIS — D649 Anemia, unspecified: Secondary | ICD-10-CM | POA: Diagnosis not present

## 2021-07-12 DIAGNOSIS — E875 Hyperkalemia: Secondary | ICD-10-CM | POA: Diagnosis not present

## 2021-07-12 LAB — BPAM RBC
Blood Product Expiration Date: 202302062359
ISSUE DATE / TIME: 202301191240
Unit Type and Rh: 7300

## 2021-07-12 LAB — BASIC METABOLIC PANEL
Anion gap: 9 (ref 5–15)
BUN: 15 mg/dL (ref 8–23)
CO2: 26 mmol/L (ref 22–32)
Calcium: 8 mg/dL — ABNORMAL LOW (ref 8.9–10.3)
Chloride: 96 mmol/L — ABNORMAL LOW (ref 98–111)
Creatinine, Ser: 1.29 mg/dL — ABNORMAL HIGH (ref 0.44–1.00)
GFR, Estimated: 42 mL/min — ABNORMAL LOW (ref >60)
Glucose, Bld: 117 mg/dL — ABNORMAL HIGH (ref 70–99)
Potassium: 3.7 mmol/L (ref 3.5–5.1)
Sodium: 131 mmol/L — ABNORMAL LOW (ref 135–145)

## 2021-07-12 LAB — CBC
HCT: 26.6 % — ABNORMAL LOW (ref 36.0–46.0)
Hemoglobin: 8.7 g/dL — ABNORMAL LOW (ref 12.0–15.0)
MCH: 28.4 pg (ref 26.0–34.0)
MCHC: 32.7 g/dL (ref 30.0–36.0)
MCV: 86.9 fL (ref 80.0–100.0)
Platelets: 175 10*3/uL (ref 150–400)
RBC: 3.06 MIL/uL — ABNORMAL LOW (ref 3.87–5.11)
RDW: 14.3 % (ref 11.5–15.5)
WBC: 11.6 10*3/uL — ABNORMAL HIGH (ref 4.0–10.5)
nRBC: 0 % (ref 0.0–0.2)

## 2021-07-12 LAB — TYPE AND SCREEN
ABO/RH(D): B POS
Antibody Screen: NEGATIVE
Unit division: 0

## 2021-07-12 LAB — GLUCOSE, CAPILLARY
Glucose-Capillary: 120 mg/dL — ABNORMAL HIGH (ref 70–99)
Glucose-Capillary: 111 mg/dL — ABNORMAL HIGH (ref 70–99)
Glucose-Capillary: 114 mg/dL — ABNORMAL HIGH (ref 70–99)
Glucose-Capillary: 114 mg/dL — ABNORMAL HIGH (ref 70–99)

## 2021-07-12 MED ORDER — MIDODRINE HCL 5 MG PO TABS: 5.0000 mg | ORAL_TABLET | Freq: Three times a day (TID) | ORAL | 1 refills | Status: DC

## 2021-07-12 MED ORDER — TORSEMIDE 40 MG PO TABS: 40.0000 mg | ORAL_TABLET | Freq: Two times a day (BID) | ORAL | Status: DC

## 2021-07-12 MED ORDER — POTASSIUM CHLORIDE CRYS ER 20 MEQ PO TBCR
40.0000 meq | EXTENDED_RELEASE_TABLET | Freq: Once | ORAL | Status: AC
  Administered 2021-07-12: 40 meq via ORAL
  Filled 2021-07-12: qty 2

## 2021-07-12 MED ORDER — DIGOXIN 62.5 MCG PO TABS: 0.0625 mg | ORAL_TABLET | Freq: Every day | ORAL | 11 refills | Status: DC

## 2021-07-12 NOTE — Progress Notes (Addendum)
Patient ID: Debra Saunders, female   DOB: 1945/09/04, 76 y.o.   MRN: [id number]    Advanced Heart Failure Rounding Note   Subjective:    Bld cultures 1/5---> No growth.   1/9- EP consulted. Not a candidate for extraction d/t comorbid conditions.  1/11 PICC placed.  1/12 S/P TEE DC/CV with restoration of SR.   Torsemide increased back up yesterday to 40 mg bid for mild fluid overload. Wt down 2 lb.   Scr 1.28 > 1.37 > 1.54 > 1.45 > 1.36 >1.29   Transfused 1u RBC yesterday, Hgb 7.2>>8.7. No overt bleeding.  Afebrile. Transitioned to cefazolin.   Sleeping in bed w/ CPAP. No current complaints.   Awaiting SNF placement.     Objective:     Vital Signs:   Temp:  [98.4 F (36.9 C)-99.2 F (37.3 C)] 99 F (37.2 C) (01/20 0349) Pulse Rate:  [64-72] 68 (01/20 0349) Resp:  [14-28] 20 (01/20 0349) BP: (101-153)/(41-63) 137/52 (01/20 0349) SpO2:  [90 %-100 %] 94 % (01/20 0349) Weight:  [89.9 kg] 89.9 kg (01/20 0650) Last BM Date: 07/11/21  Weight change: Filed Weights   07/10/21 0406 07/11/21 0500 07/12/21 0650  Weight: 91.2 kg 90.9 kg 89.9 kg    Intake/Output:   Intake/Output Summary (Last 24 hours) at 07/12/2021 0716 Last data filed at 07/12/2021 0350 Gross per 24 hour  Intake 392 ml  Output 1225 ml  Net -833 ml     Physical Exam: General:  fatigued appearing, resting w/ CPAP. No respiratory difficulty HEENT: normal Neck: supple. JVD 7 cm. Carotids 2+ bilat; no bruits. No lymphadenopathy or thyromegaly appreciated. Cor: PMI nondisplaced. Regular rate & rhythm. No rubs, gallops or murmurs. Lungs: clear Abdomen: soft, nontender, nondistended. No hepatosplenomegaly. No bruits or masses. Good bowel sounds. Extremities: no cyanosis, clubbing, rash, trace b/l LE L>R (chronic lymphedema) Neuro: alert & oriented x 3, cranial nerves grossly intact. moves all 4 extremities w/o difficulty. Affect pleasant.   Telemetry: Atrial sensed, V paced, 70s, w/ occasional PVCs    Labs: Basic Metabolic Panel: Recent Labs  Lab 07/08/21 0355 07/09/21 0345 07/10/21 0120 07/11/21 0139 07/12/21 0110  NA 131* 130* 128* 130* 131*  K 3.7 4.1 4.1 3.9 3.7  CL 95* 94* 92* 95* 96*  CO2 31 30 27 26 26   GLUCOSE 104* 125* 125* 108* 117*  BUN 14 16 18 16 15   CREATININE 1.37* 1.54* 1.45* 1.36* 1.29*  CALCIUM 7.8* 7.9* 7.9* 7.7* 8.0*    Liver Function Tests: No results for input(s): AST, ALT, ALKPHOS, BILITOT, PROT, ALBUMIN in the last 168 hours.  No results for input(s): LIPASE, AMYLASE in the last 168 hours.  No results for input(s): AMMONIA in the last 168 hours.  CBC: Recent Labs  Lab 07/08/21 0355 07/09/21 0345 07/10/21 0120 07/11/21 0139 07/12/21 0110  WBC 12.4* 12.9* 12.2* 11.0* 11.6*  HGB 8.1* 7.7* 7.8* 7.2* 8.7*  HCT 25.1* 24.4* 25.1* 23.0* 26.6*  MCV 87.5 88.1 87.2 87.5 86.9  PLT 159 170 183 170 175    Cardiac Enzymes: No results for input(s): CKTOTAL, CKMB, CKMBINDEX, TROPONINI in the last 168 hours.  BNP: BNP (last 3 results) Recent Labs    08/24/20 1506 03/27/21 1530 06/25/21 1350  BNP 51.2 31.1 344.7*    ProBNP (last 3 results) No results for input(s): PROBNP in the last 8760 hours.    Other results:  Imaging: No results found.   Medications:     Scheduled Medications:  sodium chloride  Intravenous Once   sodium chloride   Intravenous Once   amiodarone  200 mg Oral Daily   apixaban  5 mg Oral BID   benzonatate  200 mg Oral TID   Chlorhexidine Gluconate Cloth  6 each Topical Daily   cyclopentolate  1 drop Both Eyes QHS   Difluprednate  1 drop Both Eyes BID   digoxin  0.0625 mg Oral Daily   DULoxetine  60 mg Oral Daily   gabapentin  600 mg Oral TID   insulin aspart  0-9 Units Subcutaneous TID WC   macitentan  10 mg Oral Daily   midodrine  5 mg Oral TID WC   pantoprazole (PROTONIX) IV  40 mg Intravenous Q24H   polyethylene glycol  17 g Oral BID   rosuvastatin  10 mg Oral Daily   Selexipag  1,600 mcg Oral BID    sildenafil  20 mg Oral TID   sodium chloride flush  10-40 mL Intracatheter Q12H   sodium chloride flush  3 mL Intravenous Q12H   torsemide  40 mg Oral BID    Infusions:  sodium chloride      ceFAZolin (ANCEF) IV 2 g (07/12/21 0555)    PRN Medications: sodium chloride, acetaminophen **OR** acetaminophen, albuterol, cholestyramine light, diphenhydrAMINE, EPINEPHrine, guaiFENesin-codeine, hydrALAZINE, HYDROcodone-acetaminophen, morphine injection, phenol, sodium chloride flush   Assessment/Plan:   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  Group G strep bacteremia.  Septic emboli to lungs. Seen by EP, thought to be too high risk for device extraction. Unfortunately she will be at high risk for complications going forward.  - ID following.  - She will need cefazolin IV at home x 6 wks to 08/08/20.  Has PICC.  2. Acute on chronic hypoxemic respiratory failure: She developed PNA from septic emboli to lungs.  At baseline, on 4L home oxygen with CPAP at night for interstitial lung disease and OSA.   - Antibiotics as above for PNA.  - She is down to 4 L by [place] (baseline).  3. Pulmonary hypertension: Based on 4/19 cath, she had severe PAH with PVR 7.2 WU. Chronic hypoxemic respiratory failure, on home oxygen.  She has a history of remote DVT (2004) but V/Q scan negative for evidence of chronic PE.  High resolution CT chest in 10/19 showed pulmonary fibrosis, and PFTs in 2017 showed severe restriction.  Serological workup for rheumatological diseases was unremarkable.  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 Amiodarone was stopped in past given lung parenchymal disease. 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  - She is back on sildenafil 20 tid and macitentan 10 daily.  - Selexipag increased back to home dose 1600 bid.   - Continue  digoxin for RV support, decreased to 0.0625 daily.  - Home oxygen, CPAP.  4. Atrial fibrillation with tachy-brady syndrome:  Had been off amiodarone since 2019 due to interstitial lung disease/pulmonary fibrosis. Amiodarone restarted, now s/p TEE-guided DCCV this admission. A-V paced this morning.  - continue amiodarone to 200 mg daily.  - Continue Eliquis 5 mg bid.   5. AKI: Scr bumped slightly to 1.54. Improved, 1.29 today  6. 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  Probably mild volume overload and  creatinine stable.  - Continue torsemide to 40 mg bid  7. HTN: Has been low here with orthostasis, started on midodrine and stable today.  - Need to use TED hose  - Continue midodrine 5 mg TID 8. H/o DVT: Anticoagulated.  9. Left leg lymphedema: Chronic.  10. OSA: CPAP.  11. Hyponatremia: Na 131 this am - Fluid restrict.  12. Anemia:  FOBT negative 01/12.  Hgb down to 6.8 on 01/13. Received 2 U PRBCs. Hgb 7.2 1/19>>transfused 1u. Hgb 8.7 today.   No overt bleeding.  - Ferritin elevated (728) but T sat and iron levels low. Recheck iron studies in a month. Discussed with PharmD.  Awaiting SNF for rehab. Stable for d/c from HF standpoint once bed is available.   Cardiac meds for discharge:   -selexipag/macitentan/sildenafil as prior to admission -Eliquis 5 mg bid -amiodarone 200 mg daily -torsemide 40 bid -digoxin 0.0625 mg daily -Crestor 10 daily.  -mildodrine 5 mg TID  Has f/u in HF clinic scheduled. Will need CBC, BMET and digoxin level day of f/u  Lyda Jester, PA-C  07/12/2021 7:16 AM  Patient seen with PA, agree with the above note.   She is ready for discharge today whenever SNF bed available.  She should be discharged on the cardiac meds listed above.   [name] 07/12/2021

## 2021-07-12 NOTE — TOC Transition Note (Addendum)
Transition of Care [facility]) - CM/SW Discharge Note   Patient Details  Name: Debra Saunders MRN: [id number] Date of Birth: 09-18-1945  [facility]) CM/SW Contact:  Marykate Heuberger, LCSW Phone Number: 07/12/2021, 5:16 PM   Clinical Narrative:    Patient will DC to: [facility], SNF Anticipated DC date: 07/12/21 Family notified: yes, dtr, [name] Transport by: Corey Harold   Per MD patient ready for DC to [facility], SNF. RN to call report prior to discharge [phone] Room# 611-A). RN, patient, patient's family, and facility notified of DC. Discharge Summary and FL2 sent to facility. DC packet on chart. Patient's daughter, [name] is aware about bringing Debra Saunders CPAP and medications from home to the SNF facility. CSW called [facility] at 5:02pm and 5:28pm to verify they can take Debra Saunders for discharge tonight. Ambulance transport requested for patient.   CSW will sign off for now as social work intervention is no longer needed. Please consult [place] again if new needs arise.     Final next level of care: Skilled Nursing Facility Barriers to Discharge: No Barriers Identified   Patient Goals and CMS Choice Patient states their goals for this hospitalization and ongoing recovery are:: patient wants to get better CMS Medicare.gov Compare Post Acute Care list provided to:: Patient Choice offered to / list presented to : Patient  Discharge Placement PASRR number recieved: 07/12/21 Existing PASRR number confirmed : 07/12/21          Patient chooses bed at: [facility] [place] Patient to be transferred to facility by: [place] Name of family member notified: Dtr, [name] Patient and family notified of of transfer: 07/12/21  Discharge Plan and Services In-house Referral: Clinical Social Work Discharge Planning Services: [employer] Consult Post Acute Care Choice: Home Health                               Social Determinants of Health (SDOH) Interventions Food Insecurity  Interventions: Intervention Not Indicated Financial Strain Interventions: Intervention Not Indicated Housing Interventions: Intervention Not Indicated Transportation Interventions: [employer], Other (Comment) (family and friends also help provide transportation)   Readmission Risk Interventions No flowsheet data found.    Yuvia Plant, MSW, [place] Heart Failure Social Worker

## 2021-07-12 NOTE — Plan of Care (Signed)
  Problem: Education: Goal: Knowledge of General Education information will improve Description: Including pain rating scale, medication(s)/side effects and non-pharmacologic comfort measures Outcome: Progressing   Problem: Health Behavior/Discharge Planning: Goal: Ability to manage health-related needs will improve Outcome: Progressing   Problem: Clinical Measurements: Goal: Ability to maintain clinical measurements within normal limits will improve Outcome: Progressing Goal: Will remain free from infection Outcome: Progressing Goal: Diagnostic test results will improve Outcome: Progressing Goal: Respiratory complications will improve Outcome: Progressing Goal: Cardiovascular complication will be avoided Outcome: Progressing   Problem: Activity: Goal: Risk for activity intolerance will decrease Outcome: Progressing   Problem: Nutrition: Goal: Adequate nutrition will be maintained Outcome: Progressing   Problem: Coping: Goal: Level of anxiety will decrease Outcome: Progressing   Problem: Elimination: Goal: Will not experience complications related to bowel motility Outcome: Progressing Goal: Will not experience complications related to urinary retention Outcome: Progressing   Problem: Pain Managment: Goal: General experience of comfort will improve Outcome: Progressing   Problem: Safety: Goal: Ability to remain free from injury will improve Outcome: Progressing   Problem: Skin Integrity: Goal: Risk for impaired skin integrity will decrease Outcome: Progressing   Problem: Education: Goal: Knowledge of disease or condition will improve Outcome: Progressing Goal: Understanding of medication regimen will improve Outcome: Progressing Goal: Individualized Educational Video(s) Outcome: Progressing   Problem: Activity: Goal: Ability to tolerate increased activity will improve Outcome: Progressing   Problem: Cardiac: Goal: Ability to achieve and maintain  adequate cardiopulmonary perfusion will improve Outcome: Progressing   Problem: Health Behavior/Discharge Planning: Goal: Ability to safely manage health-related needs after discharge will improve Outcome: Progressing   

## 2021-07-12 NOTE — TOC CM/SW Note (Addendum)
1645 pm  Debra Saunders is not able to service CPAP. Pt owns device. Debra Saunders provided vendors that can repair. [employer] # [phone], or [employer] # [phone] are DME vendors that service CPAP. Contacted Dtr, Debra Saunders and provided her with contact info for company that will service CPAP. Provided dtr with contact number for [employer].  Made dtr aware that they are working on new CPAP. Explained to dtr she can also follow up with [facility] on pending auth. Debra Saunders RN3 CCM, Heart Failure TOC CM [phone]   HF TOC CM spoke to pt at bedside. States her CPAP will run for 3-4 hours then stop. It will eventually restart, but she has been able to get to run continuously. HF TOC CM contacted [employer] rep, Wells Guiles and insurance auth for home CPAP is still pending. Attending made aware. [place], Heart Failure TOC CM [phone]

## 2021-07-12 NOTE — TOC Progression Note (Addendum)
Transition of Care [facility]) - Progression Note    Patient Details  Name: Debra Saunders MRN: [id number] Date of Birth: 09/10/1945  Transition [facility] Care [facility]) CM/SW Contact  Jericka Kadar, LCSW Phone Number: 07/12/2021, 3:17 PM  Clinical Narrative:    HF CSW reached out to Tammy at [facility] to see if the insurance authorization had come  back yet however Tammy didn't answer and CSW had to leave a voicemail for her to return the call.   4:03pm - HF CSW spoke with Tammy at [facility] who reported that the insurance authorization came through and is approved. CSW informed the MD about the insurance approval to see about discharge today. MD will DC Ms. Collings today and family will bring CPAP to [facility], SN[facility].  CSW will continue to follow throughout discharge.   Expected Discharge Plan: Skilled Nursing Facility Barriers to Discharge: No Barriers Identified  Expected Discharge Plan and Services Expected Discharge Plan: [place] In-house Referral: Clinical Social Work Discharge Planning Services: CM Consult Post Acute Care Choice: [place] arrangements for the past 2 months: Single Family Home Expected Discharge Date: 07/09/21                                     Social Determinants of Health (SDOH) Interventions Food Insecurity Interventions: Intervention Not Indicated Financial Strain Interventions: Intervention Not Indicated Housing Interventions: Intervention Not Indicated Transportation Interventions: [employer], Other (Comment) (family and friends also help provide transportation)  Readmission Risk Interventions No flowsheet data found.  Eathel Pajak, MSW, [place] Heart Failure Social Worker

## 2021-07-12 NOTE — Plan of Care (Signed)

## 2021-07-13 DIAGNOSIS — D649 Anemia, unspecified: Secondary | ICD-10-CM | POA: Diagnosis not present

## 2021-07-13 DIAGNOSIS — I4891 Unspecified atrial fibrillation: Secondary | ICD-10-CM | POA: Diagnosis not present

## 2021-07-13 DIAGNOSIS — A409 Streptococcal sepsis, unspecified: Secondary | ICD-10-CM | POA: Diagnosis not present

## 2021-07-13 DIAGNOSIS — J9620 Acute and chronic respiratory failure, unspecified whether with hypoxia or hypercapnia: Secondary | ICD-10-CM | POA: Diagnosis not present

## 2021-07-13 NOTE — Plan of Care (Signed)

## 2021-07-16 ENCOUNTER — Encounter (HOSPITAL_COMMUNITY): Payer: Medicare HMO

## 2021-07-16 DIAGNOSIS — E785 Hyperlipidemia, unspecified: Secondary | ICD-10-CM | POA: Diagnosis not present

## 2021-07-16 DIAGNOSIS — R7309 Other abnormal glucose: Secondary | ICD-10-CM | POA: Diagnosis not present

## 2021-07-16 DIAGNOSIS — A409 Streptococcal sepsis, unspecified: Secondary | ICD-10-CM | POA: Diagnosis not present

## 2021-07-16 DIAGNOSIS — D649 Anemia, unspecified: Secondary | ICD-10-CM | POA: Diagnosis not present

## 2021-07-16 DIAGNOSIS — I10 Essential (primary) hypertension: Secondary | ICD-10-CM | POA: Diagnosis not present

## 2021-07-16 DIAGNOSIS — J9620 Acute and chronic respiratory failure, unspecified whether with hypoxia or hypercapnia: Secondary | ICD-10-CM | POA: Diagnosis not present

## 2021-07-16 DIAGNOSIS — Z86718 Personal history of other venous thrombosis and embolism: Secondary | ICD-10-CM | POA: Diagnosis not present

## 2021-07-16 DIAGNOSIS — I130 Hypertensive heart and chronic kidney disease with heart failure and stage 1 through stage 4 chronic kidney disease, or unspecified chronic kidney disease: Secondary | ICD-10-CM | POA: Diagnosis not present

## 2021-07-16 DIAGNOSIS — R5382 Chronic fatigue, unspecified: Secondary | ICD-10-CM | POA: Diagnosis not present

## 2021-07-16 DIAGNOSIS — I4891 Unspecified atrial fibrillation: Secondary | ICD-10-CM | POA: Diagnosis not present

## 2021-07-19 DIAGNOSIS — J9620 Acute and chronic respiratory failure, unspecified whether with hypoxia or hypercapnia: Secondary | ICD-10-CM | POA: Diagnosis not present

## 2021-07-20 DIAGNOSIS — I10 Essential (primary) hypertension: Secondary | ICD-10-CM | POA: Diagnosis not present

## 2021-07-22 ENCOUNTER — Encounter: Payer: Self-pay | Admitting: Family Medicine

## 2021-07-22 ENCOUNTER — Ambulatory Visit (HOSPITAL_COMMUNITY)
Admit: 2021-07-22 | Discharge: 2021-07-22 | Disposition: A | Discharge status: Home / Self Care | Payer: Medicare HMO | Attending: Family Medicine | Admitting: Family Medicine

## 2021-07-22 ENCOUNTER — Telehealth (HOSPITAL_COMMUNITY): Payer: Self-pay

## 2021-07-22 ENCOUNTER — Encounter (HOSPITAL_COMMUNITY): Payer: Self-pay

## 2021-07-22 ENCOUNTER — Other Ambulatory Visit: Payer: Self-pay

## 2021-07-22 VITALS — BP 152/60 | HR 65 | Wt 170.9 lb

## 2021-07-22 DIAGNOSIS — I38 Endocarditis, valve unspecified: Secondary | ICD-10-CM | POA: Diagnosis not present

## 2021-07-22 DIAGNOSIS — Z86718 Personal history of other venous thrombosis and embolism: Secondary | ICD-10-CM | POA: Insufficient documentation

## 2021-07-22 DIAGNOSIS — Z9989 Dependence on other enabling machines and devices: Secondary | ICD-10-CM

## 2021-07-22 DIAGNOSIS — J9611 Chronic respiratory failure with hypoxia: Secondary | ICD-10-CM | POA: Diagnosis not present

## 2021-07-22 DIAGNOSIS — E871 Hypo-osmolality and hyponatremia: Secondary | ICD-10-CM

## 2021-07-22 DIAGNOSIS — J449 Chronic obstructive pulmonary disease, unspecified: Secondary | ICD-10-CM | POA: Insufficient documentation

## 2021-07-22 DIAGNOSIS — J9620 Acute and chronic respiratory failure, unspecified whether with hypoxia or hypercapnia: Secondary | ICD-10-CM | POA: Diagnosis not present

## 2021-07-22 DIAGNOSIS — D649 Anemia, unspecified: Secondary | ICD-10-CM | POA: Insufficient documentation

## 2021-07-22 DIAGNOSIS — I480 Paroxysmal atrial fibrillation: Secondary | ICD-10-CM | POA: Insufficient documentation

## 2021-07-22 DIAGNOSIS — Z7901 Long term (current) use of anticoagulants: Secondary | ICD-10-CM | POA: Insufficient documentation

## 2021-07-22 DIAGNOSIS — E1122 Type 2 diabetes mellitus with diabetic chronic kidney disease: Secondary | ICD-10-CM | POA: Diagnosis not present

## 2021-07-22 DIAGNOSIS — I5032 Chronic diastolic (congestive) heart failure: Secondary | ICD-10-CM | POA: Diagnosis not present

## 2021-07-22 DIAGNOSIS — Z9981 Dependence on supplemental oxygen: Secondary | ICD-10-CM | POA: Insufficient documentation

## 2021-07-22 DIAGNOSIS — E875 Hyperkalemia: Secondary | ICD-10-CM | POA: Diagnosis not present

## 2021-07-22 DIAGNOSIS — I330 Acute and subacute infective endocarditis: Secondary | ICD-10-CM | POA: Diagnosis not present

## 2021-07-22 DIAGNOSIS — I10 Essential (primary) hypertension: Secondary | ICD-10-CM

## 2021-07-22 DIAGNOSIS — J8410 Pulmonary fibrosis, unspecified: Secondary | ICD-10-CM | POA: Diagnosis not present

## 2021-07-22 DIAGNOSIS — Z8673 Personal history of transient ischemic attack (TIA), and cerebral infarction without residual deficits: Secondary | ICD-10-CM | POA: Insufficient documentation

## 2021-07-22 DIAGNOSIS — Z950 Presence of cardiac pacemaker: Secondary | ICD-10-CM | POA: Diagnosis not present

## 2021-07-22 DIAGNOSIS — I5030 Unspecified diastolic (congestive) heart failure: Secondary | ICD-10-CM | POA: Diagnosis not present

## 2021-07-22 DIAGNOSIS — I130 Hypertensive heart and chronic kidney disease with heart failure and stage 1 through stage 4 chronic kidney disease, or unspecified chronic kidney disease: Secondary | ICD-10-CM | POA: Insufficient documentation

## 2021-07-22 DIAGNOSIS — Z79899 Other long term (current) drug therapy: Secondary | ICD-10-CM | POA: Diagnosis not present

## 2021-07-22 DIAGNOSIS — G4733 Obstructive sleep apnea (adult) (pediatric): Secondary | ICD-10-CM | POA: Diagnosis not present

## 2021-07-22 DIAGNOSIS — R7989 Other specified abnormal findings of blood chemistry: Secondary | ICD-10-CM | POA: Diagnosis not present

## 2021-07-22 DIAGNOSIS — I272 Pulmonary hypertension, unspecified: Secondary | ICD-10-CM | POA: Diagnosis not present

## 2021-07-22 DIAGNOSIS — R42 Dizziness and giddiness: Secondary | ICD-10-CM

## 2021-07-22 DIAGNOSIS — N183 Chronic kidney disease, stage 3 unspecified: Secondary | ICD-10-CM | POA: Diagnosis not present

## 2021-07-22 DIAGNOSIS — I890 Lymphedema, not elsewhere classified: Secondary | ICD-10-CM | POA: Diagnosis not present

## 2021-07-22 DIAGNOSIS — R5381 Other malaise: Secondary | ICD-10-CM

## 2021-07-22 LAB — BASIC METABOLIC PANEL
Anion gap: 10 (ref 5–15)
BUN: 12 mg/dL (ref 8–23)
CO2: 35 mmol/L — ABNORMAL HIGH (ref 22–32)
Calcium: 8 mg/dL — ABNORMAL LOW (ref 8.9–10.3)
Chloride: 95 mmol/L — ABNORMAL LOW (ref 98–111)
Creatinine, Ser: 1.18 mg/dL — ABNORMAL HIGH (ref 0.44–1.00)
GFR, Estimated: 47 mL/min — ABNORMAL LOW (ref >60)
Glucose, Bld: 120 mg/dL — ABNORMAL HIGH (ref 70–99)
Potassium: 2.7 mmol/L — CL (ref 3.5–5.1)
Sodium: 140 mmol/L (ref 135–145)

## 2021-07-22 LAB — CBC
HCT: 26.8 % — ABNORMAL LOW (ref 36.0–46.0)
Hemoglobin: 8 g/dL — ABNORMAL LOW (ref 12.0–15.0)
MCH: 26.8 pg (ref 26.0–34.0)
MCHC: 29.9 g/dL — ABNORMAL LOW (ref 30.0–36.0)
MCV: 89.9 fL (ref 80.0–100.0)
Platelets: 238 10*3/uL (ref 150–400)
RBC: 2.98 MIL/uL — ABNORMAL LOW (ref 3.87–5.11)
RDW: 14.7 % (ref 11.5–15.5)
WBC: 7.7 10*3/uL (ref 4.0–10.5)
nRBC: 0 % (ref 0.0–0.2)

## 2021-07-22 LAB — DIGOXIN LEVEL: Digoxin Level: 0.7 ng/mL — ABNORMAL LOW (ref 0.8–2.0)

## 2021-07-22 LAB — BRAIN NATRIURETIC PEPTIDE: B Natriuretic Peptide: 244.4 pg/mL — ABNORMAL HIGH (ref 0.0–100.0)

## 2021-07-22 MED ORDER — TORSEMIDE 40 MG PO TABS: 60.0000 mg | ORAL_TABLET | Freq: Two times a day (BID) | ORAL | Status: DC

## 2021-07-22 MED ORDER — POTASSIUM CHLORIDE ER 10 MEQ PO TBCR: 20.0000 meq | EXTENDED_RELEASE_TABLET | Freq: Every day | ORAL | 3 refills | Status: DC

## 2021-07-22 NOTE — Progress Notes (Signed)
ReDS Vest / Clip - 07/22/21 1100       ReDS Vest / Clip   Station Marker A    Ruler Value 30.5    ReDS Value Range High volume overload    ReDS Actual Value 45

## 2021-07-22 NOTE — Patient Instructions (Signed)
Medication Changes:  Increase Torsemide to 60mg  Twice daily  Start Potassium 20Meq Daily  Lab Work:  Labs done today, your results will be available in MyChart, we will contact you for abnormal readings.   Testing/Procedures:  none  Referrals:  none  Special Instructions // Education:  Wear Compression Hose   Follow-Up in: clinic in 2 weeks and Dr. [name] in 3 months  At the [facility], you and your health needs are our priority. We have a designated team specialized in the treatment of Heart Failure. This Care Team includes your primary Heart Failure Specialized Cardiologist (physician), Advanced Practice Providers (APPs- Physician Assistants and Nurse Practitioners), and Pharmacist who all work together to provide you with the care you need, when you need it.   You may see any of the following providers on your designated Care Team at your next follow up:  Dr Glori Bickers Dr Haynes Kerns, NP Lyda Jester, [place] [facility] [place], [place] Audry Riles, PharmD   Please be sure to bring in all your medications bottles to every appointment.   Need to Contact us:  If you have any questions or concerns before your next appointment please send [place] a message through [place] or call our office at [phone].    TO LEAVE A MESSAGE FOR THE NURSE SELECT OPTION 2, PLEASE LEAVE A MESSAGE INCLUDING: YOUR NAME DATE OF BIRTH CALL BACK NUMBER REASON FOR CALL**this is important as we prioritize the call backs  YOU WILL RECEIVE A CALL BACK THE SAME DAY AS LONG AS YOU CALL BEFORE 4:00 PM

## 2021-07-22 NOTE — Telephone Encounter (Signed)
Lab called with a potassium of 2.7

## 2021-07-22 NOTE — Progress Notes (Signed)
Date:  07/22/2021   ID:  Debra Saunders, DOB 01-12-1946, MRN [id number]   Provider location: [facility] Type of Visit: Established patient   PCP:  [name], MD  Cardiologist:  [name], MD Primary HF: Dr. [name]  Chief Complaint: HF follow up   History of Present Illness: Debra Saunders is a 76 y.o. 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   Admitted 10/24-10/29/19 with acute on chronic diastolic HF. HF team consulted with concerns for low output. PICC was placed and milrinone to support RV function was started along with IV Lasix to facilitate diuresis. RHC done to evaluate PAH (see below). V/Q scan negative. High res CT showed pulmonary fibrosis. Pulmonary consult and recommended stopping amiodarone. She was transitioned to torsemide 60 mg BID. DC weight: 226 lbs.    She has started on selexipag for pulmonary hypertension in addition to Opsumit and Revatio.      Echo in 3/21 showed EF 60-65%, normal RV, PASP 50 mmHg, moderate-severe MR.   Had [place] 08/30/2020- low/normal filling pressures, mild pumonary htn, and preserved cardiac. output.   Admitted to [facility] 1/23 with endocarditis and PNA, requiring pressors. BCx positive for Streptococcus group G.  All her PAH meds were held. TEE showed a large vegetation on the pacer wire in the right atrium above the tricuspid valve (1 x 1.5 cm).  No vegetations noted on the valves.  EF was 55% and there was note of mild to moderate mitral regurgitation. RV ok. She was transferred to [facility] for pacemaker extraction. EP felt she was not a candidate due to multiple comorbidities. ID consulted and  planned for 6 weeks of IV abx. She developed Afib with RVR and started on amio. She underwent successful TEE-guided DCCV 1/22. Hospitalization complicated by anemia, hypotension, and AKI Spiro and KCL, midodrine started during admission. She was discharged to SNF, weight 198 lbs.  Today she returns for post hospital HF follow up. She is now at [facility]. Overall feeling weak. She is able to participate in PT/OT and uses her walker but her legs are very weak. She is SOB with minimal activity. She continues to have dizziness when standing, bending over or moving her head.  Denies CP, abnormal bleeding, palpitations, or PND/Orthopnea. Appetite poor, experiencing early satiety. No fever or chills. Weight at facility 170 lbs. Taking all medications provided by facility.   ReDs: 45%  Device interrogation: ? AT/AF on 07/21/21 (personally reviewed with Dr. [name]).  6 minute walk (1/20): 98 m 6 minute walk (7/20): 213 m 6 minute walk (3/22): 198 m 6 minute walk (10/22): 229 m   Labs (11/19): digoxin 0.8 Labs (1/20): K 4.2, creatinine 1.36 => 1.33, digoxin 0.8 Labs (2/20): digoxin 0.2, K 4.3, creatinine 1.32 Labs (5/20): K 4.5, creatinine 1.4, digoxin 0.8 Labs (1/22): K 4, creatinine 1.07 Labs (8/22): K 4.1, creatinine 1.03 Labs (1/23): K 3.7, creatinine 1.29  ECG (personally reviewed): A-V dual paced, reviewed with Dr. [name]   PMH: 1. Lymphedema: Left lower leg (chronic).  2. COPD 3. 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  - PFTs (2017) with FEV1 64%, FEV1/FVC ratio 106%, TLC 49%, DLCO  39% (restrictive).  - CT chest high resolution (10/19): Pulmonary parenchymal pattern of fibrosis.  - CT chest (4/22): Chronic ILD 4. Atrial fibrillation: Paroxysmal.  She was previously off amiodarone due to lung disease.  - 2/19 DCCV.  - 1/23 DCCV 5. H/o DVT s/p IVC filter in 2004.  6. CAD: 4/19 coronary angiography with minimal coronary disease.  7. Bradycardia: [place]  [employer] PPM.  8. H/o traumatic SAH 2014.  9. CKD: Stage 3.  10. H/o CVA 11. OSA: CPAP 12. HTN 13. Type II diabetes 14. Pulmonary hypertension:  - PFTs 2017 with severe restriction.  - Echo (10/19): EF 60-65%, PASP 52 mmHg - RHC (4/19): Severe PAH with PVR 7.2 WU.  - RHC (10/19): mean RA 6, PA 56/17 mean 33, PCWP mean 12, CI 3.23, PVR 3.4 WU.  - RHC (3/22): mean 3, RV 30/1, PA 33/11, mean 20, PCWP mean 6, Oxygen saturations: PA 69% AO 100%, Cardiac Output (Fick) 6.18, Cardiac Index (Fick) 3.36, and PVR 2.3 WU - V/Q scan (10/19): No chronic PE.  - Anti-SCL 70 and ANA negative.  ACE normal.  RF slightly elevated at 15.  - Echo (3/21): EF 60-65%, normal RV, PASP 50 mmHg, moderate-severe MR. - TEE (1/23): EF 60-65%, mild RV dilation with normal RV systolic function, MR mild-moderate, moderate TR with vegetation on TV and pacemaker lead - Echo (1/23): EF 60-65%, normal RV, RVSP 51.5 mmHg, mild-moderate MR, moderate TR, FH: mother with CHF 15. 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  16. Mitral regurgitation: moderate to severe on 3/22 echo.  17. Zio patch (3/22): 9.3% PACs, no AF, 2.9% PVCs  Current Outpatient Medications  Medication Sig Dispense Refill   acetaminophen (TYLENOL) 325 MG tablet Take 325-650 mg by mouth every 6 (six) hours as needed for mild pain.     albuterol (ACCUNEB) 0.63 MG/3ML nebulizer solution Take 1 ampule by nebulization every 6 (six) hours as needed for wheezing.     amiodarone (PACERONE) 200 MG tablet Take 1 tablet (200 mg total) by mouth daily.     apixaban (ELIQUIS) 5 MG TABS tablet Take 5 mg by mouth 2 (two) times daily.     benzonatate (TESSALON) 200 MG capsule Take 1 capsule (200 mg total) by mouth 3 (three) times daily as needed for cough. 20 capsule 0   ceFAZolin (ANCEF) IVPB Inject 2 g into the vein every 8 (eight) hours. Indication:  Bacteremia/ Pacemaker Vegetation First Dose: Yes Last Day of Therapy:  08/08/21 Labs - Once weekly:  CBC/D  and BMP, Labs - Every other week:  ESR and CRP Method of administration: IV Push Method of administration may be changed at the discretion of home infusion pharmacist based upon assessment of the patient and/or caregiver's ability to self-administer the medication ordered. 87 Units 0   cholestyramine light (PREVALITE) 4 g packet Take 1 packet (4 g total) by mouth daily as needed (diarrhea).     Difluprednate 0.05 % EMUL Place 1 drop into both eyes 2 (two) times daily.     Digoxin 62.5 MCG TABS Take 0.0625 mg by mouth daily. 30 tablet 11   DULoxetine (CYMBALTA) 30 MG capsule TAKE 1 CAPSULE BY MOUTH ONCE DAILY FOR 14 DAYS, THEN INCREASE TO 2 CAPSULES ONCE DAILY (Patient taking differently: Take 60 mg by mouth daily.) 60 capsule 0   gabapentin (NEURONTIN) 300 MG capsule Take 600 mg by mouth 3 (three) times daily.     macitentan (OPSUMIT) 10 MG tablet Take 10 mg by mouth  daily.     meclizine (ANTIVERT) 25 MG tablet Take 1 tablet (25 mg total) by mouth 3 (three) times daily as needed for dizziness. 30 tablet 1   Menthol, Topical Analgesic, (BIOFREEZE EX) Apply 1 application topically 4 (four) times daily as needed (for pain).     midodrine (PROAMATINE) 5 MG tablet Take 1 tablet (5 mg total) by mouth 3 (three) times daily with meals. 90 tablet 1   rosuvastatin (CRESTOR) 10 MG tablet Take 1 tablet by mouth once daily 90 tablet 0   Selexipag (UPTRAVI) 1600 MCG TABS Take 1,600 mcg by mouth.     sildenafil (REVATIO) 20 MG tablet Take 20 mg by mouth 3 (three) times daily.     torsemide 40 MG TABS Take 40 mg by mouth 2 (two) times daily.     No current facility-administered medications for this encounter.    Allergies:   Aspirin and Other   Social History:  The patient  reports that she quit smoking about 31 years ago. Her smoking use included cigarettes. She has a 0.60 pack-year smoking history. She has never used smokeless tobacco. She reports that she does not drink alcohol and does not use drugs.    Family History:  The patient's family history includes Bone cancer in her sister; Breast cancer in her maternal aunt and sister; COPD in her mother; Heart disease in her mother; Hypertension in her father, mother, sister, and sister; Thyroid disease in her mother, sister, and sister.   ROS:  Please see the history of present illness.   All other systems are personally reviewed and negative.   Physical Exam:   General:  NAD. No resp difficulty, arrived in [facility] on oxygen, weak appearing. HEENT: Normal Neck: Supple. JVP 6-7. Carotids 2+ bilat; no bruits. No lymphadenopathy or thryomegaly appreciated. Cor: PMI nondisplaced. Regular rate & rhythm. No rubs, gallops or murmurs. Lungs: Rhonchi in bases. Abdomen: Soft, nontender, nondistended. No hepatosplenomegaly. No bruits or masses. Good bowel sounds. Extremities: No cyanosis, clubbing, rash, chronic LLE lymphedema, 2+ RLL edema; RUE PICC w/ old blood in dressing. Neuro: Alert & oriented x 3, cranial nerves grossly intact. Moves all 4 extremities w/o difficulty. Affect pleasant.  Recent Labs: 06/25/2021: B Natriuretic Peptide 344.7 06/29/2021: TSH 0.054 06/30/2021: ALT 9 07/03/2021: Magnesium 2.1 07/12/2021: BUN 15; Creatinine, Ser 1.29; Hemoglobin 8.7; Platelets 175; Potassium 3.7; Sodium 131  Personally reviewed   Wt Readings from Last 3 Encounters:  07/22/21 77.5 kg  07/12/21 89.9 kg  06/29/21 92.8 kg    BP (!) 152/60    Pulse 65    Wt 77.5 kg Comment: Patient was weighed at facility on 07/18/21   SpO2 98%    BMI 33.38 kg/m   ASSESSMENT AND PLAN: 1. Atrial fibrillation with tachy-brady syndrome:  Had been off amiodarone since 2019 due to interstitial lung disease/pulmonary fibrosis. Amiodarone restarted this admission 1/23. s/p TEE-guided DCCV 1/23. A-V on ECG today, looks like SR. Discussed with Dr. [name], as it looks like she has been in and out of AF/AT on device interrogation? Device rep said she was in AF yesterday for most of the day but  SR today. Will defer referral to EP (Tikosyn) as her QTc is long at 529 msec. - Continue amiodarone 200 mg daily for now.  - Continue Eliquis 5 mg bid.  No bleeding issues. CBC today. 2. 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 NYHA IIIb, functional class difficult due to fatigue and physical deconditioning. GDMT limited by orthostasis and need for midodrine. She appears mildly volume overloaded today, ReDs 45%. - Increase torsemide to 60 mg bid + 20 KCL. BMET/BNP today. - Continue digoxin 0.0625 mg daily. Dig level today. - Consider SGLT2i next. 3. Pulmonary hypertension: Based on 4/19 cath, she had severe PAH with PVR 7.2 WU. Chronic hypoxemic respiratory failure, on home oxygen.  She has a history of remote DVT (2004) but V/Q scan negative for evidence of chronic PE.  High resolution CT chest in 10/19 showed pulmonary fibrosis, and PFTs in 2017 showed severe restriction.  Serological workup for rheumatological diseases was unremarkable.  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 Amiodarone was stopped in past given lung parenchymal disease. 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  - She is back on sildenafil 20 tid and macitentan 10 daily.  - Selexipag home dose 1600 bid.   - Continue digoxin for RV support. - Home oxygen, CPAP.  4. 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  Group G strep bacteremia.  Septic emboli to lungs. Seen by EP, thought to be too high risk for device extraction. Unfortunately she will be at high risk for complications going forward.  - Continue cefazolin IV until 08/08/20 viaPICC.  5. Chronic hypoxemic respiratory failure: Recovering from recent PNA.   At baseline, on 4L home oxygen with CPAP at night for interstitial lung disease and OSA.   - Continue home O2 4 L by [place] (baseline).  6. H/o AKI: BMET today. 7. HTN: Mildly elevated today but with on-going orthostatic symptoms will not reduce midodrine today. - Place compression hose. - Continue midodrine 5 mg tid. 8. H/o DVT: Anticoagulated.  9. Left leg lymphedema: Chronic.  10. OSA: CPAP.  11. H/o Hyponatremia: BMET today. - Fluid restrict.  12. Anemia:  Received 2 U PRBCs this admission. No overt bleeding.  - Recheck iron studies in a month (07/2021).  13. Physical deconditioning: Continue PTOT at facility.  Follow up with APP in 2 weeks (ReDs) and Dr. [name] in 12 weeks.  Signed, Rafael Bihari, FNP  07/22/2021  [facility] [address] [facility] [place] [address] [phone] (office) [phone] (fax)

## 2021-07-22 NOTE — Telephone Encounter (Signed)
Lab called with a potassium of 2.7. Allena Katz NP notified, new orders to Start Spirolactone 12.5 mg daily. Hold torsemide dose tonight 07/22/2021, and to draw BMP on 07/24/2021 received, Janett Billow spoke with Nurse directly at [facility]. All orders were faxed to [phone]

## 2021-07-23 ENCOUNTER — Other Ambulatory Visit: Payer: Self-pay | Admitting: *Deleted

## 2021-07-23 DIAGNOSIS — E611 Iron deficiency: Secondary | ICD-10-CM

## 2021-07-24 ENCOUNTER — Telehealth (HOSPITAL_COMMUNITY): Payer: Self-pay | Admitting: Cardiology

## 2021-07-24 DIAGNOSIS — I10 Essential (primary) hypertension: Secondary | ICD-10-CM | POA: Diagnosis not present

## 2021-07-24 DIAGNOSIS — N390 Urinary tract infection, site not specified: Secondary | ICD-10-CM | POA: Diagnosis not present

## 2021-07-24 DIAGNOSIS — I159 Secondary hypertension, unspecified: Secondary | ICD-10-CM | POA: Diagnosis not present

## 2021-07-24 MED ORDER — MACITENTAN 10 MG PO TABS: 10.0000 mg | ORAL_TABLET | Freq: Every day | ORAL | 3 refills | Status: DC

## 2021-07-24 MED ORDER — UPTRAVI 1600 MCG PO TABS: 1600.0000 ug | ORAL_TABLET | Freq: Two times a day (BID) | ORAL | 3 refills | Status: DC

## 2021-07-25 ENCOUNTER — Ambulatory Visit (INDEPENDENT_AMBULATORY_CARE_PROVIDER_SITE_OTHER): Payer: Medicare HMO | Admitting: Family

## 2021-07-25 ENCOUNTER — Encounter: Payer: Self-pay | Admitting: Family

## 2021-07-25 ENCOUNTER — Other Ambulatory Visit: Payer: Self-pay

## 2021-07-25 ENCOUNTER — Other Ambulatory Visit (HOSPITAL_COMMUNITY): Payer: Self-pay | Admitting: *Deleted

## 2021-07-25 VITALS — BP 160/68 | HR 66 | Temp 98.1°F

## 2021-07-25 DIAGNOSIS — B955 Unspecified streptococcus as the cause of diseases classified elsewhere: Secondary | ICD-10-CM | POA: Diagnosis not present

## 2021-07-25 DIAGNOSIS — T827XXA Infection and inflammatory reaction due to other cardiac and vascular devices, implants and grafts, initial encounter: Secondary | ICD-10-CM

## 2021-07-25 DIAGNOSIS — Z452 Encounter for adjustment and management of vascular access device: Secondary | ICD-10-CM | POA: Diagnosis not present

## 2021-07-25 DIAGNOSIS — R7881 Bacteremia: Secondary | ICD-10-CM

## 2021-07-25 DIAGNOSIS — R300 Dysuria: Secondary | ICD-10-CM | POA: Diagnosis not present

## 2021-07-25 DIAGNOSIS — J9620 Acute and chronic respiratory failure, unspecified whether with hypoxia or hypercapnia: Secondary | ICD-10-CM | POA: Diagnosis not present

## 2021-07-25 DIAGNOSIS — I5030 Unspecified diastolic (congestive) heart failure: Secondary | ICD-10-CM | POA: Diagnosis not present

## 2021-07-25 HISTORY — DX: Infection and inflammatory reaction due to other cardiac and vascular devices, implants and grafts, initial encounter: T82.7XXA

## 2021-07-25 MED ORDER — MACITENTAN 10 MG PO TABS: 10.0000 mg | ORAL_TABLET | Freq: Every day | ORAL | 3 refills | Status: DC

## 2021-07-25 NOTE — Patient Instructions (Signed)
Nice to see you.  Continue to take the Cefazolin as prescribed through 08/08/21  Change to Cefadroxil 500 mg twice daily orally.   Plan for follow up in 3 weeks or sooner if needed.   Have a great day and stay safe!

## 2021-07-25 NOTE — Assessment & Plan Note (Signed)
PICC in right upper extremity functioning appropriately with small amount of dried blood under dressing. Continue routine PICC care per protocol. Ok to remove on 08/08/21 following completion of IV treatment with Cefazolin

## 2021-07-25 NOTE — Assessment & Plan Note (Signed)
Debra Saunders continues to receive her Cefazolin as prescribed with no adverse side effects. Will attempt to obtain lab work results from her skilled facility. Discussed the plan to continue with Cefazolin through 08/08/21 and then change to cefadroxil  500 mg PO bid for suppression. Plan for follow up in 3 weeks after change to Cefadroxil.

## 2021-07-25 NOTE — Progress Notes (Signed)
Subjective:    Patient ID: Debra Saunders, female    DOB: May 03, 1946, 76 y.o.   MRN: [id number]  Chief Complaint  Patient presents with   Hospitalization Follow-up    Patient reports that she feel still trying to get her strength back from being in the hospital.     HPI:  Debra Saunders is a 76 y.o. 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 Initially started on ceftriaxone and passed Penicillin allergy test and was narrowed to Pencillin. She was not a surgical candidate to have the device removed or exchanged secondary to her multiple co-morbidities. Plan was for Penicillin for 6 weeks followed by oral suppression. Prior to discharge to skilled facility change of medication was requested and Cefazolin was started. End date of IV treatment is 08/08/21. Here today for hospitalization follow up.  Debra Saunders has been receiving her Cefazolin as prescribed with no adverse side effects. Overall feeling well today and has been having fatigue. Has questions about how the infection is doing. PICC line is functioning appropriately. Working with physical and occupational therapy at her facility. No lab work is available for review.    Allergies  Allergen Reactions   Aspirin Anaphylaxis and Hives    Pt states that she is unable to take because she had bleeding in her brain   Other Other (See Comments)    Pt states that she is unable to take blood thinners because she had bleeding in her brain; Blood Thinners-per doctor at [facility] (Xarelto)      Outpatient Medications Prior to Visit  Medication Sig Dispense Refill   acetaminophen (TYLENOL) 325 MG tablet Take 325-650 mg by mouth every 6 (six) hours as needed for mild pain.     albuterol (ACCUNEB) 0.63 MG/3ML nebulizer solution Take 1 ampule by nebulization  every 6 (six) hours as needed for wheezing.     amiodarone (PACERONE) 200 MG tablet Take 1 tablet (200 mg total) by mouth daily.     apixaban (ELIQUIS) 5 MG TABS tablet Take 5 mg by mouth 2 (two) times daily.     benzonatate (TESSALON) 200 MG capsule Take 1 capsule (200 mg total) by mouth 3 (three) times daily as needed for cough. 20 capsule 0   ceFAZolin (ANCEF) IVPB Inject 2 g into the vein every 8 (eight) hours. Indication:  Bacteremia/ Pacemaker Vegetation First Dose: Yes Last Day of Therapy:  08/08/21 Labs - Once weekly:  CBC/D and BMP, Labs - Every other week:  ESR and CRP Method of administration: IV Push Method of administration may be changed at the discretion of home infusion pharmacist based upon assessment of the patient and/or caregiver's ability to self-administer the medication ordered. 87 Units 0   cholestyramine light (PREVALITE) 4 g packet Take 1 packet (4 g total) by mouth daily as needed (diarrhea).     Difluprednate 0.05 % EMUL Place 1 drop into both eyes 2 (two) times daily.     Digoxin 62.5 MCG TABS Take 0.0625 mg by mouth daily. 30 tablet 11   DULoxetine (CYMBALTA) 30 MG capsule TAKE 1 CAPSULE BY MOUTH ONCE DAILY FOR 14 DAYS, THEN INCREASE TO 2 CAPSULES ONCE DAILY (Patient taking differently: Take 60 mg by mouth daily.) 60 capsule 0   gabapentin (NEURONTIN) 300 MG capsule Take 600 mg by mouth 3 (three)  times daily.     macitentan (OPSUMIT) 10 MG tablet Take 1 tablet (10 mg total) by mouth daily. 90 tablet 3   meclizine (ANTIVERT) 25 MG tablet Take 1 tablet (25 mg total) by mouth 3 (three) times daily as needed for dizziness. 30 tablet 1   Menthol, Topical Analgesic, (BIOFREEZE EX) Apply 1 application topically 4 (four) times daily as needed (for pain).     midodrine (PROAMATINE) 5 MG tablet Take 1 tablet (5 mg total) by mouth 3 (three) times daily with meals. 90 tablet 1   rosuvastatin (CRESTOR) 10 MG tablet Take 1 tablet by mouth once daily 90 tablet 0   Selexipag  (UPTRAVI) 1600 MCG TABS Take 1 tablet (1,600 mcg total) by mouth 2 (two) times daily. 180 tablet 3   sildenafil (REVATIO) 20 MG tablet Take 20 mg by mouth 3 (three) times daily.     Torsemide 40 MG TABS Take 60 mg by mouth 2 (two) times daily.     potassium chloride (KLOR-CON) 10 MEQ tablet Take 2 tablets (20 mEq total) by mouth daily. 90 tablet 3   No facility-administered medications prior to visit.     Past Medical History:  Diagnosis Date   Arthritis    Atrial fibrillation, persistent ([place])    a. s/p TEE-guided DCCV on 08/20/2017   Cardiorenal syndrome    a. 09/2017 Creat rose to 2.7 w/ diuresis-->HD x 2.   Chest pain    a. 01/2012 Cath: nonobs dzs;  c. 04/2014 MV: No ischemia.   Chronic back pain    Chronic combined systolic (congestive) and diastolic (congestive) heart failure ([place])    a. Dx 2005 @ [facility];  b. 02/2014 Echo: EF 55-60%; c. 07/2017: EF 30-35%, diffuse HK, trivial AI, severe MR, moderate TR; d. 09/2017 Echo: EF 40-45%; e. 02/2018 Echo: EF 55-60%, Gr1 DD; f. 08/2019 Echo: EF 60-65%, Gr2 DD, Nl RV fxn, RVSP 49.30mHg. Mod-Sev MR. Mild to mod TR. Mod PR. RA ~ 836mg.   Chronic respiratory failure (HCC)    a. on home O2   Gastritis    History of intracranial hemorrhage    a. 6/14 described by neurosurgery as "small frontal posttraumatic SAH " - pt was on xarelto @ the time.   Hyperlipidemia    Hypertension    Interstitial lung disease (HCC)    Lipoma of back    Lymphedema    a. Chronic LLE edema.   NICM (nonischemic cardiomyopathy) ([place]   a. 09/2017 Echo: EF 40-45%; b. 09/2017 Cath: minor, insignificant CAD; c. 02/2018 Echo: EF 55-60%, Gr1 DD; d. 08/2019 Echo: EF 60-65%, Gr2 DD.   Obesity    PAH (pulmonary artery hypertension) ([place]   a. 09/2017 Echo: PASP 6540m; b. 09/2017 RHC: RA 23, RV 84/13, PA 84/29, PCWP 20, CO 3.89, CI 1.89. LVEDP 18.   Presence of permanent cardiac pacemaker 01/09/2014   Sepsis with metabolic encephalopathy ([place]  Sleep apnea    Streptococcal  bacteremia    [facility]   Subarachnoid hemorrhage ([place]/30/2014   Symptomatic bradycardia    a. s/p BSX PPM.   Thyroid nodule    Type II diabetes mellitus ([place]  Urinary incontinence      Past Surgical History:  Procedure Laterality Date   ABDOMINAL HYSTERECTOMY  1969   BREAST BIOPSY Right 10/28/2019   lymph node bx, [place], pending path    CARDIAC CATHETERIZATION  2013   [place]: Only  20% ostial left circumflex.   CARDIOVERSION N/A 10/12/2017   Procedure: CARDIOVERSION;  Surgeon: Minna Merritts, MD;  Location: [place] [facility];  Service: Cardiovascular;  Laterality: N/A;   CARDIOVERSION N/A 07/04/2021   Procedure: CARDIOVERSION;  Surgeon: Larey Dresser, MD;  Location: [facility] ENDOSCOPY;  Service: Cardiovascular;  Laterality: N/A;   CATARACT EXTRACTION W/PHACO Right 09/19/2020   Procedure: CATARACT EXTRACTION PHACO AND INTRAOCULAR LENS PLACEMENT (IOC) RIGHT DIABETIC 3.95 00:43.0 9.2%;  Surgeon: Leandrew Koyanagi, MD;  Location: [place];  Service: Ophthalmology;  Laterality: Right;  Pt requests to be last sleep apnea   CATARACT EXTRACTION W/PHACO Left 10/03/2020   Procedure: CATARACT EXTRACTION PHACO AND INTRAOCULAR LENS PLACEMENT ([place]) LEFT DIABETIC;  Surgeon: Leandrew Koyanagi, MD;  Location: [place];  Service: Ophthalmology;  Laterality: Left;  3.25 0:59.3 5.5%   CHOLECYSTECTOMY     COLONOSCOPY WITH PROPOFOL N/A 09/10/2018   Procedure: COLONOSCOPY WITH PROPOFOL;  Surgeon: Manya Silvas, MD;  [facility] END[facility]COPY;  Service: Endoscopy;  Laterality: N/A;   CYST REMOVAL TRUNK  2002   BACK   DIALYSIS/PERMA CATHETER INSERTION N/A 10/06/2017   Procedure: DIALYSIS/PERMA CATHETER INSERTION;  Surgeon: Katha Cabal, MD;  Location: [place] CV LAB;  Service: Cardiovascular;  Laterality: N/A;   INSERT / REPLACE / REMOVE PACEMAKER     lipoma removal  2014   back   PACEMAKER INSERTION  2015   [employer] dual chamber  pacemaker implanted by Dr Caryl Comes for symptomatic bradycardia   PERMANENT PACEMAKER INSERTION N/A 01/09/2014   Procedure: PERMANENT PACEMAKER INSERTION;  Surgeon: Deboraha Sprang, MD;  Location[facility] C[facility] LAB;  Service: Cardiovascular;  Laterality: N/A;   RIGHT HEART CATH N/A 04/19/2018   Procedure: RIGHT HEART CATH;  Surgeon: Larey Dresser, MD;  Location: [place] CV LAB;  Service: Cardiovascular;  Laterality: N/A;   RIGHT HEART CATH N/A 08/30/2020   Procedure: RIGHT HEART CATH;  Surgeon: Larey Dresser, MD;  Location: [place] [facility] LAB;  Service: Cardiovascular;  Laterality: N/A;   RIGHT/LEFT HEART CATH AND CORONARY ANGIOGRAPHY N/A 10/19/2017   Procedure: RIGHT/LEFT HEART CATH AND CORONARY ANGIOGRAPHY;  Surgeon: Wellington Hampshire, MD;  Location: [place] CV LAB;  Service: Cardiovascular;  Laterality: N/A;   TEE WITHOUT CARDIOVERSION N/A 08/20/2017   Procedure: TRANSESOPHAGEAL ECHOCARDIOGRAM (TEE) & Direct current cardioversion;  Surgeon: Minna Merritts, MD;  Location: [facility] ORS;  Service: Cardiovascular;  Laterality: N/A;   TEE WITHOUT CARDIOVERSION N/A 06/28/2021   Procedure: TRANSESOPHAGEAL ECHOCARDIOGRAM (TEE);  Surgeon: Minna Merritts, MD;  Location: [facility] ORS;  Service: Cardiovascular;  Laterality: N/A;   TEE WITHOUT CARDIOVERSION N/A 07/04/2021   Procedure: TRANSESOPHAGEAL ECHOCARDIOGRAM (TEE);  Surgeon: Larey Dresser, MD;  Location: [facility] ENDOSCOPY;  Service: Cardiovascular;  Laterality: N/A;   TRIGGER FINGER RELEASE         Review of Systems  Constitutional:  Negative for chills, diaphoresis, fatigue and fever.  Respiratory:  Negative for cough, chest tightness, shortness of breath and wheezing.   Cardiovascular:  Negative for chest pain.  Gastrointestinal:  Negative for abdominal pain, diarrhea, nausea and vomiting.     Objective:    BP (!) 160/68    Pulse 66    Temp 98.1 F (36.7 C) (Oral)    SpO2 100%  Nursing note and vital signs reviewed.  Physical  Exam Constitutional:      General: She is not in acute distress.    Appearance: She is well-developed.  Cardiovascular:  Rate and Rhythm: Normal rate and regular rhythm.     Heart sounds: Normal heart sounds.  Pulmonary:     Effort: Pulmonary effort is normal.     Breath sounds: Normal breath sounds.  Skin:    General: Skin is warm and dry.  Neurological:     Mental Status: She is alert and oriented to person, place, and time.  Psychiatric:        Behavior: Behavior normal.        Thought Content: Thought content normal.        Judgment: Judgment normal.     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 ([place]) 07/25/2021   Peripherally inserted central catheter in place 07/25/2021   Abdominal pain 07/06/2021   Pressure injury of skin 07/01/2021   Bacteremia due to Streptococcus 06/29/2021   Acute blood loss anemia 06/29/2021   klebsiella pneumoniae UTI 06/29/2021   Septic shock ([place]) 06/28/2021   Sepsis due to pneumonia ([place]) 06/25/2021   Multifocal pneumonia 06/25/2021   GI bleeding 06/25/2021   Black stool 06/25/2021   Severe sepsis with septic shock secondary to infected pacemaker with streptoccos bacteremia  06/25/2021   HTN (hypertension)    HLD (hyperlipidemia)    Chronic diastolic CHF (congestive heart failure) ([place])    Acute renal failure superimposed on stage 3a chronic kidney disease ([place])    DVT (deep venous thrombosis) (HCC)    Hyponatremia    Thrombocytopenia ([place])    Diarrhea    AKI (acute kidney injury) ([place])    Upper respiratory infection 05/24/2021   Burn 04/30/2020   Obesity (BMI 30-39.9) 04/30/2020   Elevated alkaline phosphatase level 04/30/2020   Chronic  diarrhea 11/04/2019   Dysuria 09/29/2019   Abnormal mammogram 09/29/2019   Breast pain, right 08/08/2019   Bilateral ovarian cysts 08/08/2019   B12 deficiency 08/02/2019   Iron deficiency 08/01/2019   Dizziness 07/06/2019   Shortness of breath    Atrial fibrillation with tachy brady syndrome  09/22/2017   Acute bilateral low back pain without sciatica 01/28/2017   Pain in limb 07/22/2016   Neuropathy 06/09/2016   UTI (urinary tract infection) 03/03/2016   Chronic deep vein thrombosis (DVT) ([place]) 03/03/2016   Chronic back pain 10/19/2015   Goiter 04/25/2015   Cervical disc disorder with radiculopathy of cervical region 03/30/2015   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   Interstitial lung disease ([place]) 04/06/2014   Abnormality of gait 01/13/2014   Sinoatrial node dysfunction ([place]) 01/09/2014   Allergic rhinitis 10/26/2013   Lymphedema 08/03/2013   Constipation 04/12/2013   Costochondritis 01/19/2013   Fatigue 09/27/2012   Normocytic anemia 04/28/2012   Acute on chronic respiratory failure with hypoxia ([place]) 04/26/2012   OSA (obstructive sleep apnea) 04/22/2012   Diabetes ([place]) 03/30/2012   Pulmonary hypertension, unspecified ([place]) 03/30/2012   Hyperlipidemia 03/30/2012   Hypertension      Problem List Items Addressed This Visit       Other   Bacteremia due to Streptococcus   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 Will attempt to obtain lab work results from her skilled facility. Discussed the plan to continue with Cefazolin through 08/08/21 and then change to cefadroxil  500 mg PO bid for suppression. Plan for follow up in 3 weeks after change to Cefadroxil.       Peripherally inserted central catheter in place    PICC in right upper extremity functioning appropriately with small amount of dried blood under dressing. Continue routine PICC care per  protocol. Ok to remove on 08/08/21 following completion of IV treatment with Cefazolin         I am having Arther Dames. [place] maintain her acetaminophen, (Menthol, Topical Analgesic, (BIOFREEZE EX)), albuterol, meclizine, Difluprednate, rosuvastatin, DULoxetine, gabapentin, apixaban, sildenafil, cholestyramine light, benzonatate, amiodarone, ceFAZolin, Digoxin, midodrine, Torsemide, potassium chloride, macitentan, and Uptravi.   Follow-up: Return in about 3 weeks (around 08/15/2021), or if symptoms worsen or fail to improve.   Terri Piedra, MSN, FNP-C Nurse Practitioner [facility] [place] number: [phone]

## 2021-07-26 ENCOUNTER — Other Ambulatory Visit (HOSPITAL_COMMUNITY): Payer: Self-pay

## 2021-07-26 MED ORDER — MACITENTAN 10 MG PO TABS: 10.0000 mg | ORAL_TABLET | Freq: Every day | ORAL | 3 refills | Status: DC

## 2021-07-29 ENCOUNTER — Encounter (HOSPITAL_COMMUNITY): Payer: Self-pay

## 2021-07-29 DIAGNOSIS — I5030 Unspecified diastolic (congestive) heart failure: Secondary | ICD-10-CM | POA: Diagnosis not present

## 2021-07-29 DIAGNOSIS — K219 Gastro-esophageal reflux disease without esophagitis: Secondary | ICD-10-CM | POA: Diagnosis not present

## 2021-07-29 DIAGNOSIS — R42 Dizziness and giddiness: Secondary | ICD-10-CM | POA: Diagnosis not present

## 2021-07-29 NOTE — Progress Notes (Signed)
Debra Saunders contacted me and advised she will be running out of Uptravi and Opsumit.  She is in Rehab for 4 weeks.  She has to take these medications with her from home.  Contacted [facility] and they states they did not have the foundation that approved her for this year.  Gave them the information and they also advised they have prescription for Uptravi but not opsumit.  Spoke with them last week and they also advised they only had [place] script, they also advised she had a $2,239 copay but that was before the information given for TAF.  They advised they will ship the [place] tomorrow and she should have it in 2 days.  [place] for help with these medications.  Will continue to follow up on her medications to advise her.   [place] [phone]

## 2021-07-30 ENCOUNTER — Other Ambulatory Visit (HOSPITAL_COMMUNITY): Payer: Self-pay | Admitting: *Deleted

## 2021-07-30 ENCOUNTER — Telehealth (HOSPITAL_COMMUNITY): Payer: Self-pay | Admitting: Pharmacy Technician

## 2021-07-30 ENCOUNTER — Telehealth (HOSPITAL_COMMUNITY): Payer: Self-pay | Admitting: Cardiology

## 2021-07-30 ENCOUNTER — Other Ambulatory Visit (HOSPITAL_COMMUNITY): Payer: Self-pay | Admitting: Family Medicine

## 2021-07-30 MED ORDER — UPTRAVI 1600 MCG PO TABS: 1600.0000 ug | ORAL_TABLET | Freq: Two times a day (BID) | ORAL | 11 refills | Status: DC

## 2021-07-30 NOTE — Telephone Encounter (Signed)
Advanced Heart Failure Patient Advocate Encounter  Found out that [facility] was triaged the patient's Uptravi referral and PA approvals on 02/01. The patient would need to call [phone] to request a refill. The Opsumit was sent to [facility] [phone]) pharmacy. The pharmacy has tried to contact the patient for a refill several times to refill Opsumit.  Sent Katie a message with the above information.   Charlann Boxer, CPhT

## 2021-07-30 NOTE — Telephone Encounter (Signed)
Abnormal labs received via fax Mg 1.6 Per Ninetta Lights, NP Start MagOX 200 mg one tab daily Pt aware via daughter Order sent to [facility] Fax # [phone]

## 2021-07-31 ENCOUNTER — Other Ambulatory Visit: Payer: Self-pay

## 2021-07-31 ENCOUNTER — Inpatient Hospital Stay: Payer: Medicare HMO

## 2021-07-31 ENCOUNTER — Inpatient Hospital Stay (HOSPITAL_BASED_OUTPATIENT_CLINIC_OR_DEPARTMENT_OTHER): Payer: Medicare HMO | Admitting: Internal Medicine

## 2021-07-31 ENCOUNTER — Encounter: Payer: Self-pay | Admitting: Internal Medicine

## 2021-07-31 ENCOUNTER — Inpatient Hospital Stay: Payer: Medicare HMO | Attending: Internal Medicine

## 2021-07-31 ENCOUNTER — Encounter (HOSPITAL_COMMUNITY): Payer: Self-pay

## 2021-07-31 VITALS — BP 141/68 | HR 60 | Temp 97.1°F | Wt 180.2 lb

## 2021-07-31 DIAGNOSIS — Z86718 Personal history of other venous thrombosis and embolism: Secondary | ICD-10-CM | POA: Insufficient documentation

## 2021-07-31 DIAGNOSIS — Z7901 Long term (current) use of anticoagulants: Secondary | ICD-10-CM | POA: Insufficient documentation

## 2021-07-31 DIAGNOSIS — E611 Iron deficiency: Secondary | ICD-10-CM | POA: Diagnosis not present

## 2021-07-31 DIAGNOSIS — D631 Anemia in chronic kidney disease: Secondary | ICD-10-CM | POA: Insufficient documentation

## 2021-07-31 DIAGNOSIS — N183 Chronic kidney disease, stage 3 unspecified: Secondary | ICD-10-CM | POA: Insufficient documentation

## 2021-07-31 DIAGNOSIS — R42 Dizziness and giddiness: Secondary | ICD-10-CM | POA: Diagnosis not present

## 2021-07-31 DIAGNOSIS — I5030 Unspecified diastolic (congestive) heart failure: Secondary | ICD-10-CM | POA: Diagnosis not present

## 2021-07-31 DIAGNOSIS — I4891 Unspecified atrial fibrillation: Secondary | ICD-10-CM | POA: Insufficient documentation

## 2021-07-31 DIAGNOSIS — E538 Deficiency of other specified B group vitamins: Secondary | ICD-10-CM | POA: Insufficient documentation

## 2021-07-31 DIAGNOSIS — I959 Hypotension, unspecified: Secondary | ICD-10-CM | POA: Diagnosis not present

## 2021-07-31 LAB — CBC WITH DIFFERENTIAL/PLATELET
Abs Immature Granulocytes: 0.01 10*3/uL (ref 0.00–0.07)
Basophils Absolute: 0.1 10*3/uL (ref 0.0–0.1)
Basophils Relative: 1 %
Eosinophils Absolute: 0.4 10*3/uL (ref 0.0–0.5)
Eosinophils Relative: 6 %
HCT: 31 % — ABNORMAL LOW (ref 36.0–46.0)
Hemoglobin: 9.3 g/dL — ABNORMAL LOW (ref 12.0–15.0)
Immature Granulocytes: 0 %
Lymphocytes Relative: 26 %
Lymphs Abs: 1.7 10*3/uL (ref 0.7–4.0)
MCH: 27 pg (ref 26.0–34.0)
MCHC: 30 g/dL (ref 30.0–36.0)
MCV: 90.1 fL (ref 80.0–100.0)
Monocytes Absolute: 0.5 10*3/uL (ref 0.1–1.0)
Monocytes Relative: 7 %
Neutro Abs: 4.1 10*3/uL (ref 1.7–7.7)
Neutrophils Relative %: 60 %
Platelets: 189 10*3/uL (ref 150–400)
RBC: 3.44 MIL/uL — ABNORMAL LOW (ref 3.87–5.11)
RDW: 15.1 % (ref 11.5–15.5)
WBC: 6.8 10*3/uL (ref 4.0–10.5)
nRBC: 0 % (ref 0.0–0.2)

## 2021-07-31 LAB — BASIC METABOLIC PANEL
Anion gap: 8 (ref 5–15)
BUN: 15 mg/dL (ref 8–23)
CO2: 30 mmol/L (ref 22–32)
Calcium: 8.6 mg/dL — ABNORMAL LOW (ref 8.9–10.3)
Chloride: 98 mmol/L (ref 98–111)
Creatinine, Ser: 1.25 mg/dL — ABNORMAL HIGH (ref 0.44–1.00)
GFR, Estimated: 44 mL/min — ABNORMAL LOW (ref >60)
Glucose, Bld: 117 mg/dL — ABNORMAL HIGH (ref 70–99)
Potassium: 4.4 mmol/L (ref 3.5–5.1)
Sodium: 136 mmol/L (ref 135–145)

## 2021-07-31 NOTE — Progress Notes (Signed)
Patient had recent admission for multiple issues and was d/c to [facility] with proposed home d/c  of 08/08/21.

## 2021-07-31 NOTE — Progress Notes (Signed)
**Note Debra-Identified via Obfuscation** Debra NOTE  Patient Care Team: Debra Saunders, Debra Saunders as PCP - General (Family Medicine) Debra Saunders, Debra Saunders as PCP - Cardiology (Cardiology) Debra Saunders, Debra Saunders as PCP - Electrophysiology (Cardiology) Debra Sa[facility], Debra Saunders as Referring Physician (Allergy) Debra Saunders, Debra Saunders as Referring Physician (Internal Medicine) Debra Saunders, Debra Saunders as Pharmacist (Pharmacist) Debra Saunders, Debra Saunders as Consulting Physician (Hematology and Oncology) Anemia CHIEF COMPLAINTS/PURPOSE OF CONSULTATION: Anemia   HEMATOLOGY HISTORY  # CHORNIC IRON DEFICIENCY ANEMIA [BL 10-11]-Jan today 21 PCP Hb-8.8; Debra [facility] sat-5%; ferritin 11] EGD-; colonoscopy MARCH 2020 -polyp [[facility]-hold further work-up secondary to cardiac issues]; remote-EGD-?;  February 2021-LDH/haptoglobin normal; kappa lambda light chain ratio/M panel normal;  on Venofer; November 2020-CT scan question early cirrhosis/no splenomegaly.  No acute process; MARCH 2021- STOOL OCCULT x3- NEGATIVE.   #B12 deficiency-IM every 3 to 4 months  #[facility] -question blood transfusion  [patient admitted for stroke]; CHF chronic -[ 2004 on 4 lits; Debra [facility]]; CT scan chest 2021 -no adenopathy; fibrotic changes; walker; CKD stage III-   #Incidental right axillary mass-s/p US biopsy [PCP] -negative for malignancy.  HISTORY OF PRESENTING ILLNESS: Patient in wheelchair.  Alone. Debra [facility]unders 76 y.o.  female anemia/likely secondary to ?CKD stage III is here for follow-up.  In the interim patient was admitted to the [facility] for infected pacemaker/severe sepsis-complicated course with A-fib with tachybradycardia syndrome; acute on chronic renal failure.  Patient is currently in a rehab; she currently has a PICC line-on IV antibiotics until 2/16.   Patient continues to chronic fatigue.  No blood in stools [with no nausea vomiting. Patient continues with oxygen.  Awaiting to be discharged from the rehab on 2/16.    Review of  Systems  Constitutional:  Positive for malaise/fatigue. Negative for chills, diaphoresis, fever and weight loss.  HENT:  Negative for nosebleeds and sore throat.   Eyes:  Negative for double vision.  Respiratory:  Positive for shortness of breath. Negative for cough, hemoptysis, sputum production and wheezing.   Cardiovascular:  Negative for chest pain, palpitations, orthopnea and leg swelling.  Gastrointestinal:  Negative for abdominal pain, blood in stool, constipation, heartburn, melena, nausea and vomiting.  Genitourinary:  Negative for dysuria, frequency and urgency.  Musculoskeletal:  Negative for back pain and joint pain.  Skin: Negative.  Negative for itching and rash.  Neurological:  Negative for dizziness, tingling, focal weakness, weakness and headaches.  Endo/Heme/Allergies:  Does not bruise/bleed easily.  Psychiatric/Behavioral:  Negative for depression. The patient is not nervous/anxious and does not have insomnia.    MEDICAL HISTORY:  Past Medical History:  Diagnosis Date   Arthritis    Atrial fibrillation, persistent (Debra [facility])    a. s/p TEE-guided DCCV on 08/20/2017   Cardiorenal syndrome    a. 09/2017 Creat rose to 2.7 w/ diuresis-->HD x 2.   Chest pain    a. 01/2012 Cath: nonobs dzs;  c. 04/2014 MV: No ischemia.   Chronic back pain    Chronic combined systolic (congestive) and diastolic (congestive) heart failure (Debra [facility])    a. Dx 2005 @ [facility];  b. 02/2014 Echo: EF 55-60%; c. 07/2017: EF 30-35%, diffuse HK, trivial AI, severe MR, moderate TR; d. 09/2017 Echo: EF 40-45%; e. 02/2018 Echo: EF 55-60%, Gr1 DD; f. 08/2019 Echo: EF 60-65%, Gr2 DD, Nl RV fxn, RVSP 49.31mHg. Mod-Sev MR. Mild to mod TR. Mod PR. RA ~ 828mg.   Chronic respiratory failure (HCC)    a. on home O2   Gastritis  History of intracranial hemorrhage    a. 6/14 described by neurosurgery as "small frontal posttraumatic SAH " - pt was on xarelto @ the time.   Hyperlipidemia    Hypertension    Interstitial lung disease  (HCC)    Lipoma of back    Lymphedema    a. Chronic LLE edema.   NICM (nonischemic cardiomyopathy) ([place])    a. 09/2017 Echo: EF 40-45%; b. 09/2017 Cath: minor, insignificant CAD; c. 02/2018 Echo: EF 55-60%, Gr1 DD; d. 08/2019 Echo: EF 60-65%, Gr2 DD.   Obesity    PAH (pulmonary artery hypertension) (Debra Saunders)    a. 09/2017 Echo: PASP 61mHg; b. 09/2017 RHC: RA 23, RV 84/13, PA 84/29, PCWP 20, CO 3.89, CI 1.89. LVEDP 18.   Presence of permanent cardiac pacemaker 01/09/2014   Sepsis with metabolic encephalopathy (Debra [facility]    Sleep apnea    Streptococcal bacteremia    Stroke (Debra [facility]   [place]achnoid hemorrhage ([place]   Symptomatic bradycardia    a. s/p BSX PPM.   Thyroid nodule    Type II diabetes mellitus ([place]    Urinary incontinence     SURGICAL HISTORY: Past Surgical History:  Procedure Laterality Date   ABDOMINAL HYSTERECTOMY  1969   BREAST BIOPSY Right 10/28/2019   [place]x, [place], [place] path    [place]IAC [place]  [place]   [place]: Only 20% ostial left circumflex.   CARDIOVERSION N/A 10/12/2017   Procedure: CARDIOVERSION;  Surgeon: GMinna Saunders Debra Saunders;  [facility];  Service: Cardiovascular;  Laterality: N/A;   CARDIOVERSION N/A 07/04/2021   Procedure: CARDIOVERSION;  Surgeon: MLarey Dresser Debra [facility];  [facility];  Service: Cardiovascular;  Laterality: N/A;   CATARACT EXTRACTION W/PHACO Right 09/19/2020   Procedure: CATARACT EXTRACTION PHACO AND INTRAOCULAR LENS PLACEMENT (IOC) RIGHT DIABETIC 3.95 00:43.0 9.2%;  Surgeon: BLeandrew Koyanagi Debra [facility];  Location: [facility]  Service: Ophthalmology;  Laterality: Right;  Pt requests to be last sleep apnea   CATARACT EXTRACTION W/PHACO Left 10/03/2020   Procedure: CATARACT EXTRACTION PHACO AND INTRAOCULAR LENS PLACEMENT ([place] LEFT DIABETIC;  Surgeon: BLeandrew Koyanagi Debra [facility];  [place] [facility]  Service: Ophthalmology;  Laterality: Left;  3.25 0:59.3 5.5%    CHOLECYSTECTOMY     COLONOSCOPY WITH PROPOFOL N/A 09/10/2018   Procedure: COLONOSCOPY WITH PROPOFOL;  Surgeon: EManya Silvas Debra Sa[facility];  [facility];  Service: Endoscopy;  Laterality: N/A;   CYST REMOVAL TRUNK  2002   BACK   DIALYSIS/PERMA CATHETER INSERTION N/A 10/06/2017   Procedure: DIALYSIS/PERMA CATHETER INSERTION;  Surgeon: SKatha Cabal Debra Sa[facility];  [facility];  Service: Cardiovascular;  Laterality: N/A;   INSERT / REPLACE / REMOVE PACEMAKER     lipoma removal  [facility]   back   PACEMAKER INSERTION  2015   [employer] dual chamber pacemaker implanted by Dr KCaryl Comesfor symptomatic bradycardia   PERMANENT PACEMAKER INSERTION N/A 01/09/2014   Procedure: PERMANENT PACEMAKER INSERTION;  Surgeon: SDeboraha Saunders Debra Saunders;  [facility];  Service: Cardiovascular;  Laterality: N/A;   RIGHT HEART CATH N/A 04/19/2018   Procedure: RIGHT HEART CATH;  Surgeon: MLarey Dresser Debra Sa[facility];  [place] [facility];  Service: Cardiovascular;  Laterality: N/A;   RIGHT HEART CATH N/A 08/30/2020   Procedure: RIGHT HEART CATH;  Surgeon: MLarey Dresser Debra Saunders;  [facility];  Service: Cardiovascular;  Laterality: N/A;   RIGHT/LEFT HEART CATH AND CORONARY ANGIOGRAPHY N/A 10/19/2017  Procedure: RIGHT/LEFT HEART CATH AND CORONARY ANGIOGRAPHY;  Surgeon: Debra Saunders, Debra Saunders;  Location: [place] [facility] LAB;  Service: Cardiovascular;  Laterality: N/A;   TEE WITHOUT CARDIOVERSION N/A 08/20/2017   Procedure: TRANSESOPHAGEAL ECHOCARDIOGRAM (TEE) & Direct current cardioversion;  Surgeon: Debra Saunders, Debra Saunders;  Location: [facility];  Service: Cardiovascular;  Laterality: N/A;   TEE WITHOUT CARDIOVERSION N/A 06/28/2021   Procedure: TRANSESOPHAGEAL ECHOCARDIOGRAM (TEE);  Surgeon: Debra Saunders, Debra Saunders;  Location: [facility];  Service: Cardiovascular;  Laterality: N/A;   TEE WITHOUT CARDIOVERSION N/A 07/04/2021   Procedure: TRANSESOPHAGEAL ECHOCARDIOGRAM (TEE);  Surgeon: Larey Dresser, Debra Saunders;  [facility];  Service: Cardiovascular;  Laterality: N/A;   TRIGGER FINGER RELEASE      SOCIAL HISTORY: Social History   Socioeconomic History   Marital status: Divorced    Spouse name: Not on file   Number of children: 4   Years of education: 12   Highest education level: High school graduate  Occupational History   Occupation: Retired- factory work    Comment: 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 Has 4 children. No pets.      Work - Restaurant manager, fast food, retired      Diet - regular      02/14/2019: reports has Meals On Wheels delivered to her home;       Remote hx of smoking > 30 years; No alcohol-    Social Determinants of Health   Financial Resource Strain: Medium Risk   Difficulty of Paying Living Expenses: Somewhat hard  Food Insecurity: No Food Insecurity   Worried About Charity fundraiser in the Last Year: Never true   Ran Out of Food in the Last Year: Never true  Transportation Needs: No Transportation Needs   Lack of Transportation (Medical): No   Lack of Transportation (Non-Medical): No  Physical Activity: Not on file  Stress: No Stress Concern Present   Feeling of Stress : Only a little  Social Connections: Socially Isolated   Frequency of Communication with Friends and Family: More than three times a week   Frequency of Social Gatherings with Friends and Family: Once a week   Attends Religious Services: Never   Marine scientist or Organizations: No   Attends Music therapist: Never   Marital Status:  Divorced  Human resources officer Violence: Not At Risk   Fear of Current or Ex-Partner: No   Emotionally Abused: No   Physically Abused: No   Sexually Abused: No    FAMILY HISTORY: Family History  Problem Relation Age of Onset   Heart disease Mother    Hypertension Mother    COPD Mother        was a smoker   Thyroid disease Mother    Hypertension Father    Hypertension Sister    Thyroid disease Sister    Breast cancer Sister    Hypertension Sister    Thyroid disease Sister  Bone cancer Sister    Breast cancer Maternal Aunt    Kidney disease Neg Hx    Bladder Cancer Neg Hx     ALLERGIES:  is allergic to aspirin and other.  MEDICATIONS:  Current Outpatient Medications  Medication Sig Dispense Refill   acetaminophen (TYLENOL) 325 MG tablet Take 325-650 mg by mouth every 6 (six) hours as needed for mild pain.     albuterol (ACCUNEB) 0.63 MG/3ML nebulizer solution Take 1 ampule by nebulization every 6 (six) hours as needed for wheezing.     amiodarone (PACERONE) 200 MG tablet Take 1 tablet (200 mg total) by mouth daily.     apixaban (ELIQUIS) 5 MG TABS tablet Take 5 mg by mouth 2 (two) times daily.     benzonatate (TESSALON) 200 MG capsule Take 1 capsule (200 mg total) by mouth 3 (three) times daily as needed for cough. 20 capsule 0   ceFAZolin (ANCEF) IVPB Inject 2 g into the vein every 8 (eight) hours. Indication:  Bacteremia/ Pacemaker Vegetation First Dose: Yes Last Day of Therapy:  08/08/21 Labs - Once weekly:  CBC/D and BMP, Labs - Every other week:  ESR and CRP Method of administration: IV Push Method of administration may be changed at the discretion of home infusion pharmacist based upon assessment of the patient and/or caregiver's ability to self-administer the medication ordered. 87 Units 0   cholestyramine light (PREVALITE) 4 g packet Take 1 packet (4 g total) by mouth daily as needed (diarrhea).     Difluprednate 0.05 % EMUL Place 1 drop into both eyes 2 (two) times  daily.     Digoxin 62.5 MCG TABS Take 0.0625 mg by mouth daily. 30 tablet 11   DULoxetine (CYMBALTA) 30 MG capsule TAKE 1 CAPSULE BY MOUTH ONCE DAILY FOR 14 DAYS, THEN INCREASE TO 2 CAPSULES ONCE DAILY (Patient taking differently: Take 30 mg by mouth daily.) 60 capsule 0   gabapentin (NEURONTIN) 300 MG capsule Take 600 mg by mouth 3 (three) times daily.     macitentan (OPSUMIT) 10 MG tablet Take 1 tablet (10 mg total) by mouth daily. 90 tablet 3   Magnesium 200 MG TABS Take 1 tablet by mouth in the morning and at bedtime.     meclizine (ANTIVERT) 25 MG tablet Take 1 tablet (25 mg total) by mouth 3 (three) times daily as needed for dizziness. 30 tablet 1   Menthol, Topical Analgesic, (BIOFREEZE EX) Apply 1 application topically 4 (four) times daily as needed (for pain).     midodrine (PROAMATINE) 5 MG tablet Take 1 tablet (5 mg total) by mouth 3 (three) times daily with meals. 90 tablet 1   pantoprazole (PROTONIX) 40 MG tablet Take 40 mg by mouth daily.     potassium chloride (KLOR-CON) 10 MEQ tablet Take 2 tablets (20 mEq total) by mouth daily. (Patient taking differently: Take 40 mEq by mouth in the morning, at noon, and at bedtime.) 90 tablet 3   rosuvastatin (CRESTOR) 10 MG tablet Take 1 tablet by mouth once daily 90 tablet 0   Selexipag (UPTRAVI) 1600 MCG TABS Take 1 tablet (1,600 mcg total) by mouth 2 (two) times daily. 60 tablet 11   sildenafil (REVATIO) 20 MG tablet Take 20 mg by mouth 3 (three) times daily.     spironolactone (ALDACTONE) 25 MG tablet Take 12.5 mg by mouth daily.     Torsemide 40 MG TABS Take 60 mg by mouth 2 (two) times daily.     No current  facility-administered medications for this visit.      PHYSICAL EXAMINATION:   Vitals:   07/31/21 1300  BP: (!) 141/68  Pulse: 60  Temp: (!) 97.1 F (36.2 C)   Filed Weights   07/31/21 1300  Weight: 180 lb 3.2 oz (81.7 kg)    Physical Exam Constitutional:      Comments: Patient in wheelchair.  Alone.  She is on O2  nasal cannula.  HENT:     Head: Normocephalic and atraumatic.     Mouth/Throat:     Pharynx: No oropharyngeal exudate.  Eyes:     Pupils: Pupils are equal, round, and reactive to light.  Cardiovascular:     Rate and Rhythm: Normal rate and regular rhythm.  Pulmonary:     Effort: No respiratory distress.     Breath sounds: No wheezing.     Comments: Decreased air entry bilaterally.  No wheeze or crackles. Abdominal:     General: Bowel sounds are normal. There is no distension.     Palpations: Abdomen is soft. There is no mass.     Tenderness: There is no abdominal tenderness. There is no guarding or rebound.  Musculoskeletal:        General: No tenderness. Normal range of motion.     Cervical back: Normal range of motion and neck supple.  Skin:    General: Skin is warm.  Neurological:     Mental Status: She is alert and oriented to person, place, and time.  Psychiatric:        Mood and Affect: Affect normal.    LABORATORY DATA:  I have reviewed the data as listed Lab Results  Component Value Date   WBC 6.8 07/31/2021   HGB 9.3 (L) 07/31/2021   HCT 31.0 (L) 07/31/2021   MCV 90.1 07/31/2021   PLT 189 07/31/2021   Recent Labs    06/25/21 1350 06/25/21 2243 06/28/21 0500 06/29/21 1254 06/29/21 1608 06/30/21 0522 07/01/21 0011 07/12/21 0110 07/22/21 1101 07/31/21 1257  NA 126*   < > 134* 140  --  138   < > 131* 140 136  K 4.9   < > 3.8 3.9  --  3.9   < > 3.7 2.7* 4.4  CL 98   < > 100 96*  --  94*   < > 96* 95* 98  CO2 20*   < > 30 34*  --  34*   < > 26 35* 30  GLUCOSE 171*   < > 242* 226*  --  175*   < > 117* 120* 117*  BUN 74*   < > 76* 31*  --  19   < > '15 12 15  ' CREATININE 3.11*   < > 1.46* 0.96  --  0.80   < > 1.29* 1.18* 1.25*  CALCIUM 7.9*   < > 7.5* 8.3*  --  8.1*   < > 8.0* 8.0* 8.6*  GFRNONAA 15*   < > 37* >60  --  >60   < > 42* 47* 44*  PROT 6.2*   < > 6.3* 6.4* 6.8 5.8*  --   --   --   --   ALBUMIN 2.2*   < > 2.4* 2.3* 2.3* 2.0*  --   --   --   --    AST 21   < > 13* 14* 13* 11*  --   --   --   --   ALT 13   < >  '12 11 12 9  ' --   --   --   --   ALKPHOS 126   < > 123 126 122 99  --   --   --   --   BILITOT 3.0*   < > 1.0 1.1 0.9 0.9  --   --   --   --   BILIDIR 1.7*  --  0.3*  --  0.3*  --   --   --   --   --   IBILI 1.3*  --  0.7  --  0.6  --   --   --   --   --    < > = values in this interval not displayed.     DG Chest Port 1 View  Result Date: 07/03/2021 CLINICAL DATA:  RIGHT PICC line inserted EXAM: PORTABLE CHEST 1 VIEW COMPARISON:  07/12/2020 FINDINGS: Interval placement RIGHT PICC line with tip at the RIGHT cavoatrial junction. LEFT-sided pacer noted. Stable cardiac silhouette. Mild pulmonary edema pattern. IMPRESSION: 1. RIGHT PICC line insertion with tip at the cavoatrial junction. 2. Mild pulmonary edema pattern. Electronically Signed   By: Suzy Bouchard M.D.   On: 07/03/2021 11:54   ECHO TEE  Result Date: 07/04/2021    TRANSESOPHOGEAL ECHO REPORT   Patient Name:   ALEICIA KENAGY Date of Exam: 07/04/2021 Medical Rec #:  [id number]        Height:       60.0 in Accession #:    [id number]       Weight:       200.4 lb Date of Birth:  07-20-1945         BSA:          1.868 m Patient Age:    47 years         BP:           108/43 mmHg Patient Gender: F                HR:           59 bpm. Exam Location:  Inpatient Procedure: Transesophageal Echo, Color Doppler and Cardiac Doppler Indications:     I48.91* Unspecified atrial fibrillation  History:         Patient has prior history of Echocardiogram examinations, most                  recent 06/28/2021. CHF, Pulmonary HTN, Arrythmias:Atrial                  Fibrillation; Risk Factors:Hypertension, Diabetes, Dyslipidemia                  and Sleep Apnea.  Sonographer:     [name] Sarna Senior RDCS Referring Phys:  [place] Diagnosing Phys: Franki Monte PROCEDURE: After discussion of the risks and benefits of a TEE, an informed consent was obtained from the patient. The transesophogeal  probe was passed without difficulty through the esophogus of the patient. Sedation performed by different physician. The patient was monitored while under deep sedation. Anesthestetic sedation was provided intravenously by Anesthesiology: 65m of Propofol. The patient developed no complications during the procedure. A successful direct current cardioversion was performed at 200 joules with 1 attempt. IMPRESSIONS  1. Left ventricular ejection fraction, by estimation, is 60 to 65%. The left ventricle has normal function. There is mild left ventricular hypertrophy.  2. Peak RV-RA gradient 30 mmHg. There is a 1.6 x 1 cm  mobile vegetation attached to the pacemaker in the RA. Right ventricular systolic function is normal. The right ventricular size is mildly enlarged.  3. Left atrial size was mildly dilated. No left atrial/left atrial appendage thrombus was detected.  4. Right atrial size was moderately dilated.  5. The mitral valve is normal in structure. Mild to moderate mitral valve regurgitation. No evidence of mitral stenosis.  6. There is mobile vegetation on the tricuspid valve. The tricuspid valve is abnormal. Tricuspid valve regurgitation is moderate.  7. The aortic valve is tricuspid. Aortic valve regurgitation is not visualized. Aortic valve sclerosis/calcification is present, without any evidence of aortic stenosis.  8. Grade 3 plaque in the descending thoracic aorta.  9. No PFO or ASD by color doppler. FINDINGS  Left Ventricle: Left ventricular ejection fraction, by estimation, is 60 to 65%. The left ventricle has normal function. The left ventricular internal cavity size was normal in size. There is mild left ventricular hypertrophy. Right Ventricle: Peak RV-RA gradient 30 mmHg. 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 The right ventricular size is mildly enlarged. No increase in right ventricular wall thickness. Right ventricular systolic function  is normal. Left Atrium:  Left atrial size was mildly dilated. No left atrial/left atrial appendage thrombus was detected. Right Atrium: Right atrial size was moderately dilated. Pericardium: There is no evidence of pericardial effusion. Mitral Valve: The mitral valve is normal in structure. Mild to moderate mitral valve regurgitation. No evidence of mitral valve stenosis. Tricuspid Valve: There is mobile vegetation on the tricuspid valve. The tricuspid valve is abnormal. Tricuspid valve regurgitation is moderate. Aortic Valve: The aortic valve is tricuspid. Aortic valve regurgitation is not visualized. Aortic valve sclerosis/calcification is present, without any evidence of aortic stenosis. Pulmonic Valve: The pulmonic valve was normal in structure. Pulmonic valve regurgitation is not visualized. Aorta: Grade 3 plaque in the descending thoracic aorta. The aortic root is normal in size and structure. IAS/Shunts: No PFO or ASD by color doppler. Additional Comments: A device lead is visualized in the right ventricle. Dalton McleanMD Electronically signed by Franki Monte Signature Date/Time: 07/04/2021/3:51:43 PM    Final    [place] EKG SITE RITE  Result Date: 07/02/2021 If Site Rite image not attached, placement could not be confirmed due to current cardiac rhythm.    Iron deficiency # Chronic Anemia [BL10-11]-iron deficiency/? CKD stage III  currently s/p Venofer. [name] JAN 2023 [sat 9%; ferrtin-700+- [facility]/sepsis]; today hemoglobin is 9.3 improved from 8.  # HOLD Venofer today.   # Etiology of anemia-likely CKD stage III/ [chronic diarrhea/malabsorption].   #History of DVT/s/p IVC filter; ?  A. Fib-Eliquis-STABLE>   #Borderline low B12-230; s/p B12 injection.  Hold B12 injections in Jan 2023 within normal limits.    # DISPOSITION: # NO venofer or b12 today # Follow up in 4 months-Debra [facility]; labs-- cbc;bmp; irons tudies/ferritin; B12 levels-possible Venofer / B12 injection- Dr.B  Cc; Tammi Klippel  All questions were answered.  The patient knows to call the clinic with any problems, questions or concerns.    Debra Saunders, Debra Saunders 07/31/2021 4:25 PM

## 2021-07-31 NOTE — Assessment & Plan Note (Addendum)
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 CKD stage III  currently s/p Venofer. [name] JAN 2023 [sat 9%; ferrtin-700+- hospital/sepsis]; today hemoglobin is 9.3 improved from 8.  # HOLD Venofer today.   # Etiology of anemia-likely CKD stage III/ [chronic diarrhea/malabsorption].   #History of DVT/s/p IVC filter; ?  A. Fib-Eliquis-STABLE>   #Borderline low B12-230; s/p B12 injection.  Hold B12 injections in Jan 2023 within normal limits.    # DISPOSITION: # NO venofer or b12 today # Follow up in 4 months-MD; labs-- cbc;bmp; irons tudies/ferritin; B12 levels-possible Venofer / B12 injection- Dr.B  Cc; Tammi Klippel

## 2021-07-31 NOTE — Progress Notes (Signed)
Today was with [name] and contacted [employer] and they have [place] going out today for delivery tomorrow.  Contacted [facility] and they advised they are missing information on script and needs a PA.  Contacted [facility] and they advised [name] is set to ship today, pharmacist has approved it and will be delivered tomorrow.  Will confirm this that it was delivered to patient tomorrow.   [place] [phone]

## 2021-08-01 ENCOUNTER — Ambulatory Visit: Payer: Self-pay

## 2021-08-02 DIAGNOSIS — I5030 Unspecified diastolic (congestive) heart failure: Secondary | ICD-10-CM | POA: Diagnosis not present

## 2021-08-02 DIAGNOSIS — R42 Dizziness and giddiness: Secondary | ICD-10-CM | POA: Diagnosis not present

## 2021-08-02 DIAGNOSIS — I959 Hypotension, unspecified: Secondary | ICD-10-CM | POA: Diagnosis not present

## 2021-08-02 DIAGNOSIS — A409 Streptococcal sepsis, unspecified: Secondary | ICD-10-CM | POA: Diagnosis not present

## 2021-08-02 NOTE — Telephone Encounter (Signed)
Advanced Heart Failure Patient Advocate Encounter  Since patient was sent a 7 day supply of Uptravi, [facility] cant refill it until Tuesday of next week. Kenton Kingfisher that [facility] will likely call to speak with the patient that day. Opsumit was already mailed out from [employer]  Charlann Boxer, CPhT

## 2021-08-05 ENCOUNTER — Other Ambulatory Visit (HOSPITAL_COMMUNITY): Payer: Self-pay | Admitting: Cardiology

## 2021-08-05 ENCOUNTER — Encounter (HOSPITAL_COMMUNITY): Payer: Medicare HMO

## 2021-08-06 DIAGNOSIS — G4733 Obstructive sleep apnea (adult) (pediatric): Secondary | ICD-10-CM | POA: Diagnosis not present

## 2021-08-06 DIAGNOSIS — I5030 Unspecified diastolic (congestive) heart failure: Secondary | ICD-10-CM | POA: Diagnosis not present

## 2021-08-06 DIAGNOSIS — Z20822 Contact with and (suspected) exposure to covid-19: Secondary | ICD-10-CM | POA: Diagnosis not present

## 2021-08-06 DIAGNOSIS — J9620 Acute and chronic respiratory failure, unspecified whether with hypoxia or hypercapnia: Secondary | ICD-10-CM | POA: Diagnosis not present

## 2021-08-07 DIAGNOSIS — J9620 Acute and chronic respiratory failure, unspecified whether with hypoxia or hypercapnia: Secondary | ICD-10-CM | POA: Diagnosis not present

## 2021-08-07 DIAGNOSIS — U071 COVID-19: Secondary | ICD-10-CM | POA: Diagnosis not present

## 2021-08-07 DIAGNOSIS — I5030 Unspecified diastolic (congestive) heart failure: Secondary | ICD-10-CM | POA: Diagnosis not present

## 2021-08-08 DIAGNOSIS — J9620 Acute and chronic respiratory failure, unspecified whether with hypoxia or hypercapnia: Secondary | ICD-10-CM | POA: Diagnosis not present

## 2021-08-08 DIAGNOSIS — I5030 Unspecified diastolic (congestive) heart failure: Secondary | ICD-10-CM | POA: Diagnosis not present

## 2021-08-08 DIAGNOSIS — U071 COVID-19: Secondary | ICD-10-CM | POA: Diagnosis not present

## 2021-08-09 DIAGNOSIS — J9620 Acute and chronic respiratory failure, unspecified whether with hypoxia or hypercapnia: Secondary | ICD-10-CM | POA: Diagnosis not present

## 2021-08-09 DIAGNOSIS — U071 COVID-19: Secondary | ICD-10-CM | POA: Diagnosis not present

## 2021-08-09 DIAGNOSIS — I5030 Unspecified diastolic (congestive) heart failure: Secondary | ICD-10-CM | POA: Diagnosis not present

## 2021-08-09 DIAGNOSIS — D649 Anemia, unspecified: Secondary | ICD-10-CM | POA: Diagnosis not present

## 2021-08-09 DIAGNOSIS — I10 Essential (primary) hypertension: Secondary | ICD-10-CM | POA: Diagnosis not present

## 2021-08-12 DIAGNOSIS — M6281 Muscle weakness (generalized): Secondary | ICD-10-CM | POA: Diagnosis not present

## 2021-08-12 DIAGNOSIS — Z736 Limitation of activities due to disability: Secondary | ICD-10-CM | POA: Diagnosis not present

## 2021-08-12 DIAGNOSIS — U071 COVID-19: Secondary | ICD-10-CM | POA: Diagnosis not present

## 2021-08-12 DIAGNOSIS — I482 Chronic atrial fibrillation, unspecified: Secondary | ICD-10-CM | POA: Diagnosis not present

## 2021-08-12 DIAGNOSIS — R7881 Bacteremia: Secondary | ICD-10-CM | POA: Diagnosis not present

## 2021-08-12 DIAGNOSIS — M6259 Muscle wasting and atrophy, not elsewhere classified, multiple sites: Secondary | ICD-10-CM | POA: Diagnosis not present

## 2021-08-12 DIAGNOSIS — Z741 Need for assistance with personal care: Secondary | ICD-10-CM | POA: Diagnosis not present

## 2021-08-12 DIAGNOSIS — R4789 Other speech disturbances: Secondary | ICD-10-CM | POA: Diagnosis not present

## 2021-08-12 DIAGNOSIS — J9611 Chronic respiratory failure with hypoxia: Secondary | ICD-10-CM | POA: Diagnosis not present

## 2021-08-12 DIAGNOSIS — E876 Hypokalemia: Secondary | ICD-10-CM | POA: Diagnosis not present

## 2021-08-12 DIAGNOSIS — J189 Pneumonia, unspecified organism: Secondary | ICD-10-CM | POA: Diagnosis not present

## 2021-08-12 DIAGNOSIS — A419 Sepsis, unspecified organism: Secondary | ICD-10-CM | POA: Diagnosis not present

## 2021-08-12 DIAGNOSIS — R2681 Unsteadiness on feet: Secondary | ICD-10-CM | POA: Diagnosis not present

## 2021-08-13 DIAGNOSIS — G4733 Obstructive sleep apnea (adult) (pediatric): Secondary | ICD-10-CM | POA: Diagnosis not present

## 2021-08-13 DIAGNOSIS — I959 Hypotension, unspecified: Secondary | ICD-10-CM | POA: Diagnosis not present

## 2021-08-13 DIAGNOSIS — I5030 Unspecified diastolic (congestive) heart failure: Secondary | ICD-10-CM | POA: Diagnosis not present

## 2021-08-13 DIAGNOSIS — U071 COVID-19: Secondary | ICD-10-CM | POA: Diagnosis not present

## 2021-08-13 DIAGNOSIS — R42 Dizziness and giddiness: Secondary | ICD-10-CM | POA: Diagnosis not present

## 2021-08-13 DIAGNOSIS — M6281 Muscle weakness (generalized): Secondary | ICD-10-CM | POA: Diagnosis not present

## 2021-08-13 DIAGNOSIS — A409 Streptococcal sepsis, unspecified: Secondary | ICD-10-CM | POA: Diagnosis not present

## 2021-08-13 DIAGNOSIS — J9620 Acute and chronic respiratory failure, unspecified whether with hypoxia or hypercapnia: Secondary | ICD-10-CM | POA: Diagnosis not present

## 2021-08-14 ENCOUNTER — Other Ambulatory Visit (HOSPITAL_COMMUNITY): Payer: Self-pay | Admitting: *Deleted

## 2021-08-14 ENCOUNTER — Other Ambulatory Visit (HOSPITAL_COMMUNITY): Payer: Self-pay

## 2021-08-14 DIAGNOSIS — R197 Diarrhea, unspecified: Secondary | ICD-10-CM | POA: Diagnosis not present

## 2021-08-14 DIAGNOSIS — N390 Urinary tract infection, site not specified: Secondary | ICD-10-CM | POA: Diagnosis not present

## 2021-08-14 MED ORDER — AMIODARONE HCL 200 MG PO TABS: 200.0000 mg | ORAL_TABLET | Freq: Every day | ORAL | 6 refills | Status: DC

## 2021-08-14 MED ORDER — MIDODRINE HCL 5 MG PO TABS: 5.0000 mg | ORAL_TABLET | Freq: Three times a day (TID) | ORAL | 3 refills | Status: DC

## 2021-08-14 NOTE — Progress Notes (Incomplete)
Date:  08/14/2021   ID:  Debra Saunders, DOB [birth year], MRN [id number]   Provider location: [place] [facility] Type of Visit: Established patient   PCP:  [name], MD  Cardiologist:  [name], MD Primary HF: Dr. [name]  Chief Complaint: HF follow up   History of Present Illness: Debra Saunders is a 76 y.o. 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   Admitted 10/24-10/29/19 with acute on chronic diastolic HF. HF team consulted with concerns for low output. PICC was placed and milrinone to support RV function was started along with IV Lasix to facilitate diuresis. RHC done to evaluate PAH (see below). V/Q scan negative. High res CT showed pulmonary fibrosis. Pulmonary consult and recommended stopping amiodarone. She was transitioned to torsemide 60 mg BID. DC weight: 226 lbs.    She has started on selexipag for pulmonary hypertension in addition to Opsumit and Revatio.      Echo in 3/21 showed EF 60-65%, normal RV, PASP 50 mmHg, moderate-severe MR.   Had Success 08/30/2020- low/normal filling pressures, mild pumonary htn, and preserved cardiac. output.   Admitted to [facility] 1/23 with endocarditis and PNA, requiring pressors. BCx positive for Streptococcus group G.  All her PAH meds were held. TEE showed a large vegetation on the pacer wire in the right atrium above the tricuspid valve (1 x 1.5 cm).  No vegetations noted on the valves.  EF was 55% and there was note of mild to moderate mitral regurgitation. RV ok. She was transferred to [facility] for pacemaker extraction. EP felt she was not a candidate due to multiple comorbidities. ID consulted and  planned for 6 weeks of IV abx. She developed Afib with RVR and started on amio. She underwent successful TEE-guided DCCV 1/22. Hospitalization complicated by anemia, hypotension, and AKI Spiro and KCL, midodrine started during admission. She was discharged to SNF, weight 198 lbs.  Today she returns for post hospital HF follow up. She is now at [facility]. Overall feeling weak. She is able to participate in PT/OT and uses her walker but her legs are very weak. She is SOB with minimal activity. She continues to have dizziness when standing, bending over or moving her head.  Denies CP, abnormal bleeding, palpitations, or PND/Orthopnea. Appetite poor, experiencing early satiety. No fever or chills. Weight at facility 170 lbs. Taking all medications provided by facility.   ReDs: 45%  Device interrogation: ? AT/AF on 07/21/21 (personally reviewed with Dr. [name]).  6 minute walk (1/20): 98 m 6 minute walk (7/20): 213 m 6 minute walk (3/22): 198 m 6 minute walk (10/22): 229 m   Labs (11/19): digoxin 0.8 Labs (1/20): K 4.2, creatinine 1.36 => 1.33, digoxin 0.8 Labs (2/20): digoxin 0.2, K 4.3, creatinine 1.32 Labs (5/20): K 4.5, creatinine 1.4, digoxin 0.8 Labs (1/22): K 4, creatinine 1.07 Labs (8/22): K 4.1, creatinine 1.03 Labs (1/23): K 3.7, creatinine 1.29  ECG (personally reviewed): A-V dual paced, reviewed with Dr. [name]   PMH: 1. Lymphedema: Left lower leg (chronic).  2. COPD 3. 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  - PFTs (2017) with FEV1 64%, FEV1/FVC ratio 106%, TLC 49%, DLCO  39% (restrictive).  - CT chest high resolution (10/19): Pulmonary parenchymal pattern of fibrosis.  - CT chest (4/22): Chronic ILD 4. Atrial fibrillation: Paroxysmal.  She was previously off amiodarone due to lung disease.  - 2/19 DCCV.  - 1/23 DCCV 5. H/o DVT s/p IVC filter in 2004.  6. CAD: 4/19 coronary angiography with minimal coronary disease.  7. Bradycardia: [place]  [facility] PPM.  8. H/o traumatic SAH 2014.  9. CKD: Stage 3.  10. H/o CVA 11. OSA: CPAP 12. HTN 13. Type II diabetes 14. Pulmonary hypertension:  - PFTs 2017 with severe restriction.  - Echo (10/19): EF 60-65%, PASP 52 mmHg - RHC (4/19): Severe PAH with PVR 7.2 WU.  - RHC (10/19): mean RA 6, PA 56/17 mean 33, PCWP mean 12, CI 3.23, PVR 3.4 WU.  - RHC (3/22): mean 3, RV 30/1, PA 33/11, mean 20, PCWP mean 6, Oxygen saturations: PA 69% AO 100%, Cardiac Output (Fick) 6.18, Cardiac Index (Fick) 3.36, and PVR 2.3 WU - V/Q scan (10/19): No chronic PE.  - Anti-SCL 70 and ANA negative.  ACE normal.  RF slightly elevated at 15.  - Echo (3/21): EF 60-65%, normal RV, PASP 50 mmHg, moderate-severe MR. - TEE (1/23): EF 60-65%, mild RV dilation with normal RV systolic function, MR mild-moderate, moderate TR with vegetation on TV and pacemaker lead - Echo (1/23): EF 60-65%, normal RV, RVSP 51.5 mmHg, mild-moderate MR, moderate TR, FH: mother with CHF 15. 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  16. Mitral regurgitation: moderate to severe on 3/22 echo.  17. Zio patch (3/22): 9.3% PACs, no AF, 2.9% PVCs  Current Outpatient Medications  Medication Sig Dispense Refill   acetaminophen (TYLENOL) 325 MG tablet Take 325-650 mg by mouth every 6 (six) hours as needed for mild pain.     albuterol (ACCUNEB) 0.63 MG/3ML nebulizer solution Take 1 ampule by nebulization every 6 (six) hours as needed for wheezing.     amiodarone (PACERONE) 200 MG tablet Take 1 tablet (200 mg total) by mouth daily.     apixaban (ELIQUIS) 5 MG TABS tablet Take 5 mg by mouth 2 (two) times daily.     benzonatate (TESSALON) 200 MG capsule Take 1 capsule (200 mg total) by mouth 3 (three) times daily as needed for cough. 20 capsule 0   cholestyramine light (PREVALITE) 4 g packet Take 1 packet (4 g total) by mouth daily as needed (diarrhea).     Difluprednate 0.05 % EMUL Place 1 drop into both eyes 2 (two) times daily.      Digoxin 62.5 MCG TABS Take 0.0625 mg by mouth daily. 30 tablet 11   DULoxetine (CYMBALTA) 30 MG capsule TAKE 1 CAPSULE BY MOUTH ONCE DAILY FOR 14 DAYS, THEN INCREASE TO 2 CAPSULES ONCE DAILY (Patient taking differently: Take 30 mg by mouth daily.) 60 capsule 0   gabapentin (NEURONTIN) 300 MG capsule Take 600 mg by mouth 3 (three) times daily.     macitentan (OPSUMIT) 10 MG tablet Take 1 tablet (10 mg total) by mouth daily. 90 tablet 3   Magnesium 200 MG TABS Take 1 tablet by mouth in the morning and at bedtime.     meclizine (ANTIVERT) 25 MG tablet Take 1 tablet (25 mg total) by mouth 3 (three) times daily as needed for dizziness. 30 tablet 1   Menthol, Topical Analgesic, (BIOFREEZE EX) Apply 1 application topically 4 (four) times daily as needed (for pain).     midodrine (PROAMATINE) 5 MG tablet Take  1 tablet (5 mg total) by mouth 3 (three) times daily with meals. 90 tablet 1   pantoprazole (PROTONIX) 40 MG tablet Take 40 mg by mouth daily.     potassium chloride (KLOR-CON) 10 MEQ tablet Take 2 tablets (20 mEq total) by mouth daily. (Patient taking differently: Take 40 mEq by mouth in the morning, at noon, and at bedtime.) 90 tablet 3   rosuvastatin (CRESTOR) 10 MG tablet Take 1 tablet by mouth once daily 90 tablet 0   Selexipag (UPTRAVI) 1600 MCG TABS Take 1 tablet (1,600 mcg total) by mouth 2 (two) times daily. 60 tablet 11   sildenafil (REVATIO) 20 MG tablet Take 20 mg by mouth 3 (three) times daily.     spironolactone (ALDACTONE) 25 MG tablet Take 12.5 mg by mouth daily.     Torsemide 40 MG TABS Take 60 mg by mouth 2 (two) times daily.     No current facility-administered medications for this visit.    Allergies:   Aspirin and Other   Social History:  The patient  reports that she quit smoking about 31 years ago. Her smoking use included cigarettes. She has a 0.60 pack-year smoking history. She has never used smokeless tobacco. She reports that she does not drink alcohol and does not use  drugs.   Family History:  The patient's family history includes Bone cancer in her sister; Breast cancer in her maternal aunt and sister; COPD in her mother; Heart disease in her mother; Hypertension in her father, mother, sister, and sister; Thyroid disease in her mother, sister, and sister.   ROS:  Please see the history of present illness.   All other systems are personally reviewed and negative.   Physical Exam:   General:  NAD. No resp difficulty, arrived in [facility] on oxygen, weak appearing. HEENT: Normal Neck: Supple. JVP 6-7. Carotids 2+ bilat; no bruits. No lymphadenopathy or thryomegaly appreciated. Cor: PMI nondisplaced. Regular rate & rhythm. No rubs, gallops or murmurs. Lungs: Rhonchi in bases. Abdomen: Soft, nontender, nondistended. No hepatosplenomegaly. No bruits or masses. Good bowel sounds. Extremities: No cyanosis, clubbing, rash, chronic LLE lymphedema, 2+ RLL edema; RUE PICC w/ old blood in dressing. Neuro: Alert & oriented x 3, cranial nerves grossly intact. Moves all 4 extremities w/o difficulty. Affect pleasant.  Recent Labs: 06/29/2021: TSH 0.054 06/30/2021: ALT 9 07/03/2021: Magnesium 2.1 07/22/2021: B Natriuretic Peptide 244.4 07/31/2021: BUN 15; Creatinine, Ser 1.25; Hemoglobin 9.3; Platelets 189; Potassium 4.4; Sodium 136  Personally reviewed   Wt Readings from Last 3 Encounters:  07/31/21 81.7 kg (180 lb 3.2 oz)  07/22/21 77.5 kg (170 lb 14.4 oz)  07/12/21 89.9 kg (198 lb 3.1 oz)    There were no vitals taken for this visit.  ASSESSMENT AND PLAN: 1. Atrial fibrillation with tachy-brady syndrome:  Had been off amiodarone since 2019 due to interstitial lung disease/pulmonary fibrosis. Amiodarone restarted this admission 1/23. s/p TEE-guided DCCV 1/23. A-V on ECG today, looks like SR. Discussed with Dr. [name], as it looks like she has been in and out of AF/AT on device interrogation? Device rep said she was in AF yesterday for most of the day but SR today. Will defer  referral to EP (Tikosyn) as her QTc is long at 529 msec. - Continue amiodarone 200 mg daily for now.  - Continue Eliquis 5 mg bid.  No bleeding issues. CBC today. 2. 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 NYHA IIIb, functional class difficult due to fatigue and physical deconditioning. GDMT limited by orthostasis and need for midodrine. She appears mildly volume overloaded today, ReDs 45%. - Increase torsemide to 60 mg bid + 20 KCL. BMET/BNP today. - Continue digoxin 0.0625 mg daily. Dig level today. - Consider SGLT2i next. 3. Pulmonary hypertension: Based on 4/19 cath, she had severe PAH with PVR 7.2 WU. Chronic hypoxemic respiratory failure, on home oxygen.  She has a history of remote DVT (2004) but V/Q scan negative for evidence of chronic PE.  High resolution CT chest in 10/19 showed pulmonary fibrosis, and PFTs in 2017 showed severe restriction.  Serological workup for rheumatological diseases was unremarkable.  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 Amiodarone was stopped in past given lung parenchymal disease. 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  - She is back on sildenafil 20 tid and macitentan 10 daily.  - Selexipag home dose 1600 bid.   - Continue digoxin for RV support. - Home oxygen, CPAP.  4. 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  Group G strep bacteremia.  Septic emboli to lungs. Seen by EP, thought to be too high risk for device extraction. Unfortunately she will be at high risk for complications going forward.  - Continue cefazolin IV until 08/08/20 viaPICC.  5. Chronic hypoxemic respiratory failure: Recovering from recent PNA.  At baseline, on 4L  home oxygen with CPAP at night for interstitial lung disease and OSA.   - Continue home O2 4 L by [place] (baseline).  6. H/o AKI: BMET today. 7. HTN: Mildly elevated today but with on-going orthostatic symptoms will not reduce midodrine today. - Place compression hose. - Continue midodrine 5 mg tid. 8. H/o DVT: Anticoagulated.  9. Left leg lymphedema: Chronic.  10. OSA: CPAP.  11. H/o Hyponatremia: BMET today. - Fluid restrict.  12. Anemia:  Received 2 U PRBCs this admission. No overt bleeding.  - Recheck iron studies in a month (07/2021).  13. Physical deconditioning: Continue PTOT at facility.  Follow up with APP in 2 weeks (ReDs) and Dr. [name] in 12 weeks.  Signed, Rafael Bihari, FNP  08/14/2021  [facility] [address] [facility] [place] [address] [phone] (office) [phone] (fax)

## 2021-08-14 NOTE — Progress Notes (Signed)
Today had a home visit with Debra Saunders.  She came home from rehab yesterday.  She has several packs of meds.  We went through her med list and filled her med boxes.  Called in refills for her meds to HF clinic in [place] to have them sent to [address].  She states doing pretty good at home, she gets tired easily.  She has several family members staying with her.  She has everything needed for daily living.  She is worried about gaining weight from eating.  She states her family is pushing her to eat.  She is taking all her medications.  She denies any chest pain, dizziness or increased shortness of breath today.  She is getting over covid.  She will postpone her [facility] appt this week til later.  Will continue to visit for heart failure, diet and medication management.   Earl [phone]

## 2021-08-14 NOTE — Telephone Encounter (Signed)
Meds refilled.

## 2021-08-15 ENCOUNTER — Ambulatory Visit: Payer: Medicare HMO | Admitting: Family

## 2021-08-16 ENCOUNTER — Encounter (HOSPITAL_COMMUNITY): Payer: Medicare HMO

## 2021-08-16 DIAGNOSIS — H209 Unspecified iridocyclitis: Secondary | ICD-10-CM | POA: Diagnosis not present

## 2021-08-19 ENCOUNTER — Telehealth: Payer: Self-pay | Admitting: Family Medicine

## 2021-08-19 ENCOUNTER — Encounter: Payer: Self-pay | Admitting: Internal Medicine

## 2021-08-19 ENCOUNTER — Telehealth (INDEPENDENT_AMBULATORY_CARE_PROVIDER_SITE_OTHER): Payer: Medicare HMO | Admitting: Internal Medicine

## 2021-08-19 DIAGNOSIS — R251 Tremor, unspecified: Secondary | ICD-10-CM | POA: Diagnosis not present

## 2021-08-19 DIAGNOSIS — R0602 Shortness of breath: Secondary | ICD-10-CM | POA: Diagnosis not present

## 2021-08-19 DIAGNOSIS — G479 Sleep disorder, unspecified: Secondary | ICD-10-CM | POA: Diagnosis not present

## 2021-08-19 DIAGNOSIS — R059 Cough, unspecified: Secondary | ICD-10-CM | POA: Diagnosis not present

## 2021-08-19 DIAGNOSIS — R42 Dizziness and giddiness: Secondary | ICD-10-CM

## 2021-08-19 NOTE — Telephone Encounter (Signed)
Pt daughter stated that pt was release from the hospital on Jan 21st. Pt daughter stated that Pt started Pt after being release from hospital.  Pt daughter called in stating that pt is experiencing dizziness and weakness. Pt daughter stated that Pt contracted Covid while in physical therapy. Pt daughter stated that when Pt is sleep, Pt is reaching for items and bite her finger. Pt daughter stated that Pt is talking to herself in her sleep. Pt daughter stated that pt is having hot and cold flashes. Pt daughter thinks that everything is coming from medication. Pt was schedule for VV on 08/19/2021 at 3:40pm with Dr. Quay Burow. Pt daughter called back to office to schedule appt per Dr. Quay Burow VV. Spoke with Dr. [name] pt needs to be admitted in ED. Advise pt daughter of Dr. [name] request. Pt daughter understood.   Per note is for 08/19/2021

## 2021-08-19 NOTE — Progress Notes (Signed)
Virtual Visit via telephone note  I connected with Debra Saunders on 08/19/21 at  3:40 PM EST by telephone and verified that I am speaking with the correct person using two identifiers.   I discussed the limitations of evaluation and management by telemedicine and the availability of in person appointments. The patient expressed understanding and agreed to proceed.  Present for the visit:  Myself, Dr Billey Gosling, [name], and her daughter [name].  The patient is currently at home and I am in the office.    No referring provider.    History of Present Illness: She is here for an acute visit.  This patient is not currently to me, but she just states she is getting over a lot of different sicknesses.  She has had pneumonia and sepsis with infection of pacemaker and endocarditis- required weeks of antibiotics.  She was in bed for weeks.  She got COVID approximately 08/05/2021 and has been feeling worse since then.  She states she is still coughing and bringing up yellow phlegm, wheezing and shortness of breath.  She feels the symptoms have gotten worse since having COVID and or not improving.  She states chills, but no fevers.  She does feel tired and is having night sweats.  She is drinking a lot of water, but not eating well.  She is on oxygen via nasal cannula.  She has not checked her blood pressure or other vitals in the past few days.  She is feeling very dizzy, but denies this being a spinning sensation-just more of a lightheadedness.  She feels that whenever she changes position or moves around.  She also feels like there is fluid inside her head swishing around when she lays down or sits up.  Her ears are popping.  She has tenderness on the sides of her head.  That is worse when she lays down and has pressure on them.  She denies any changes in vision.  She denies headaches.  She has shaking in her hands, which is not new but it has gotten worse.  At times she has difficulty feeding  herself.  Her daughter states that when she is sleeping she is acting out her dreams.  If she is dreaming about eating something the other day she went to eat something and then bit her finger.  She has done that several times.  She is reaching for things in her sleep.  They deny new medications.  Physical therapy is close to becoming tomorrow for their first visit.  She thought she was getting a home health nurse, but no one has called.     Review of Systems  Constitutional:  Positive for chills, diaphoresis (night sweats) and malaise/fatigue. Negative for fever.  HENT:  Positive for congestion. Negative for ear discharge and ear pain.   Eyes:  Negative for blurred vision and double vision.  Respiratory:  Positive for cough, sputum production (yellow), shortness of breath and wheezing.   Cardiovascular:  Positive for chest pain (with certain movements - turning over in bed).  Gastrointestinal:  Positive for diarrhea (a little). Negative for abdominal pain, blood in stool, constipation, heartburn and nausea.  Genitourinary:  Negative for dysuria.  Musculoskeletal:  Positive for back pain, joint pain and myalgias.  Neurological:  Positive for tremors (shaking more than they were - has difficulty feeding herself at times). Negative for focal weakness and headaches.       Pain on b/l temples - worse when laying down -  since having covid  Psychiatric/Behavioral:         Acting out dreams      Social History   Socioeconomic History   Marital status: Divorced    Spouse name: Not on file   Number of children: 4   Years of education: 12   Highest education level: High school graduate  Occupational History   Occupation: Retired- factory work    Comment: exposed to Pharmacist, community  Tobacco Use   Smoking status: Former    Packs/day: 0.30    Years: 2.00    Pack years: 0.60    Types: Cigarettes    Quit date: 06/23/1990    Years since quitting: 31.1   Smokeless tobacco: Never  Vaping Use    Vaping Use: Never used  Substance and Sexual Activity   Alcohol use: No   Drug use: No   Sexual activity: Not Currently  Other Topics Concern   Not on file  Social History Narrative   Lives in [place] with daughter. Has 4 children. No pets.      Work - Restaurant manager, fast food, retired      Diet - regular      02/14/2019: reports has Meals On Wheels delivered to her home;       Remote hx of smoking > 30 years; No alcohol-    Social Determinants of Health   Financial Resource Strain: Medium Risk   Difficulty of Paying Living Expenses: Somewhat hard  Food Insecurity: No Food Insecurity   Worried About Charity fundraiser in the Last Year: Never true   Ran Out of Food in the Last Year: Never true  Transportation Needs: No Transportation Needs   Lack of Transportation (Medical): No   Lack of Transportation (Non-Medical): No  Physical Activity: Not on file  Stress: No Stress Concern Present   Feeling of Stress : Only a little  Social Connections: Socially Isolated   Frequency of Communication with Friends and Family: More than three times a week   Frequency of Social Gatherings with Friends and Family: Once a week   Attends Religious Services: Never   Marine scientist or Organizations: No   Attends Music therapist: Never   Marital Status: Divorced     Observations/Objective:    Assessment and Plan:  Shortness of breath, lightheadedness, cough, sleep disorder-acting out dreams, tremor, among other concerns  The patient has multiple complaints on this visit is not appropriate for a virtual visit especially at telephone visit.  Discussed with her and her daughter that she needs further evaluation of these concerns including blood work, imaging and needs to be evaluated in person.  Advised that she call PCPs office to see if they can get her in in the next couple of days.    I did recommend trying to hold the [employer]  He has taken them in the past and  has not had any side effects, but potentially they can cause dizziness and confusion.   Follow Up Instructions:    I discussed the assessment and treatment plan with the patient. The patient was provided an opportunity to ask questions and all were answered. The patient agreed with the plan and demonstrated an understanding of the instructions.   The patient was advised to call back or seek an in-person evaluation if the symptoms worsen or if the condition fails to improve as anticipated.  Time spent on telephone call: 15 minutes  Binnie Rail, MD

## 2021-08-20 NOTE — Telephone Encounter (Signed)
I called and spoke wit the patients daughter and I did convince her that the patient needs to be admitted to the hospital and  so she agreed to take her by ambulance to the hospital.  Tabari Volkert,cma

## 2021-08-21 ENCOUNTER — Emergency Department: Payer: Medicare HMO

## 2021-08-21 ENCOUNTER — Other Ambulatory Visit: Payer: Self-pay

## 2021-08-21 ENCOUNTER — Other Ambulatory Visit (HOSPITAL_COMMUNITY): Payer: Self-pay

## 2021-08-21 ENCOUNTER — Inpatient Hospital Stay: Payer: Medicare HMO

## 2021-08-21 ENCOUNTER — Inpatient Hospital Stay
Admission: EM | Admit: 2021-08-21 | Discharge: 2021-08-27 | DRG: 153 | Disposition: A | Discharge status: Home / Self Care | Payer: Medicare HMO | Attending: Hospitalist | Admitting: Hospitalist

## 2021-08-21 ENCOUNTER — Encounter (HOSPITAL_COMMUNITY): Payer: Self-pay

## 2021-08-21 DIAGNOSIS — I428 Other cardiomyopathies: Secondary | ICD-10-CM | POA: Diagnosis present

## 2021-08-21 DIAGNOSIS — Z87891 Personal history of nicotine dependence: Secondary | ICD-10-CM

## 2021-08-21 DIAGNOSIS — Z95828 Presence of other vascular implants and grafts: Secondary | ICD-10-CM

## 2021-08-21 DIAGNOSIS — R531 Weakness: Secondary | ICD-10-CM | POA: Diagnosis not present

## 2021-08-21 DIAGNOSIS — Z86718 Personal history of other venous thrombosis and embolism: Secondary | ICD-10-CM

## 2021-08-21 DIAGNOSIS — E1122 Type 2 diabetes mellitus with diabetic chronic kidney disease: Secondary | ICD-10-CM | POA: Diagnosis present

## 2021-08-21 DIAGNOSIS — I130 Hypertensive heart and chronic kidney disease with heart failure and stage 1 through stage 4 chronic kidney disease, or unspecified chronic kidney disease: Secondary | ICD-10-CM | POA: Diagnosis present

## 2021-08-21 DIAGNOSIS — R42 Dizziness and giddiness: Secondary | ICD-10-CM

## 2021-08-21 DIAGNOSIS — R911 Solitary pulmonary nodule: Secondary | ICD-10-CM | POA: Diagnosis not present

## 2021-08-21 DIAGNOSIS — I890 Lymphedema, not elsewhere classified: Secondary | ICD-10-CM | POA: Diagnosis present

## 2021-08-21 DIAGNOSIS — I951 Orthostatic hypotension: Secondary | ICD-10-CM | POA: Diagnosis not present

## 2021-08-21 DIAGNOSIS — Z8673 Personal history of transient ischemic attack (TIA), and cerebral infarction without residual deficits: Secondary | ICD-10-CM

## 2021-08-21 DIAGNOSIS — J9611 Chronic respiratory failure with hypoxia: Secondary | ICD-10-CM | POA: Diagnosis present

## 2021-08-21 DIAGNOSIS — Z8249 Family history of ischemic heart disease and other diseases of the circulatory system: Secondary | ICD-10-CM

## 2021-08-21 DIAGNOSIS — R110 Nausea: Secondary | ICD-10-CM | POA: Diagnosis not present

## 2021-08-21 DIAGNOSIS — D631 Anemia in chronic kidney disease: Secondary | ICD-10-CM | POA: Diagnosis not present

## 2021-08-21 DIAGNOSIS — N1832 Chronic kidney disease, stage 3b: Secondary | ICD-10-CM | POA: Diagnosis present

## 2021-08-21 DIAGNOSIS — J0140 Acute pansinusitis, unspecified: Secondary | ICD-10-CM | POA: Diagnosis not present

## 2021-08-21 DIAGNOSIS — I272 Pulmonary hypertension, unspecified: Secondary | ICD-10-CM | POA: Diagnosis present

## 2021-08-21 DIAGNOSIS — I495 Sick sinus syndrome: Secondary | ICD-10-CM | POA: Diagnosis not present

## 2021-08-21 DIAGNOSIS — R011 Cardiac murmur, unspecified: Secondary | ICD-10-CM

## 2021-08-21 DIAGNOSIS — I38 Endocarditis, valve unspecified: Secondary | ICD-10-CM | POA: Diagnosis not present

## 2021-08-21 DIAGNOSIS — Z9981 Dependence on supplemental oxygen: Secondary | ICD-10-CM

## 2021-08-21 DIAGNOSIS — U099 Post covid-19 condition, unspecified: Secondary | ICD-10-CM | POA: Diagnosis present

## 2021-08-21 DIAGNOSIS — Z886 Allergy status to analgesic agent status: Secondary | ICD-10-CM

## 2021-08-21 DIAGNOSIS — I4819 Other persistent atrial fibrillation: Secondary | ICD-10-CM | POA: Diagnosis not present

## 2021-08-21 DIAGNOSIS — J849 Interstitial pulmonary disease, unspecified: Secondary | ICD-10-CM | POA: Diagnosis not present

## 2021-08-21 DIAGNOSIS — I2510 Atherosclerotic heart disease of native coronary artery without angina pectoris: Secondary | ICD-10-CM | POA: Diagnosis present

## 2021-08-21 DIAGNOSIS — Z8616 Personal history of COVID-19: Secondary | ICD-10-CM

## 2021-08-21 DIAGNOSIS — E785 Hyperlipidemia, unspecified: Secondary | ICD-10-CM | POA: Diagnosis present

## 2021-08-21 DIAGNOSIS — E669 Obesity, unspecified: Secondary | ICD-10-CM | POA: Diagnosis present

## 2021-08-21 DIAGNOSIS — J811 Chronic pulmonary edema: Secondary | ICD-10-CM | POA: Diagnosis not present

## 2021-08-21 DIAGNOSIS — I2721 Secondary pulmonary arterial hypertension: Secondary | ICD-10-CM | POA: Diagnosis not present

## 2021-08-21 DIAGNOSIS — I4891 Unspecified atrial fibrillation: Secondary | ICD-10-CM | POA: Diagnosis present

## 2021-08-21 DIAGNOSIS — Z20822 Contact with and (suspected) exposure to covid-19: Secondary | ICD-10-CM | POA: Diagnosis not present

## 2021-08-21 DIAGNOSIS — G4733 Obstructive sleep apnea (adult) (pediatric): Secondary | ICD-10-CM | POA: Diagnosis present

## 2021-08-21 DIAGNOSIS — J9610 Chronic respiratory failure, unspecified whether with hypoxia or hypercapnia: Secondary | ICD-10-CM | POA: Diagnosis present

## 2021-08-21 DIAGNOSIS — J8410 Pulmonary fibrosis, unspecified: Secondary | ICD-10-CM | POA: Diagnosis present

## 2021-08-21 DIAGNOSIS — J479 Bronchiectasis, uncomplicated: Secondary | ICD-10-CM | POA: Diagnosis not present

## 2021-08-21 DIAGNOSIS — I480 Paroxysmal atrial fibrillation: Secondary | ICD-10-CM | POA: Diagnosis not present

## 2021-08-21 DIAGNOSIS — I959 Hypotension, unspecified: Secondary | ICD-10-CM | POA: Diagnosis not present

## 2021-08-21 DIAGNOSIS — U071 COVID-19: Secondary | ICD-10-CM | POA: Diagnosis not present

## 2021-08-21 DIAGNOSIS — E860 Dehydration: Secondary | ICD-10-CM | POA: Diagnosis present

## 2021-08-21 DIAGNOSIS — Z950 Presence of cardiac pacemaker: Secondary | ICD-10-CM

## 2021-08-21 DIAGNOSIS — Z8679 Personal history of other diseases of the circulatory system: Secondary | ICD-10-CM

## 2021-08-21 DIAGNOSIS — J324 Chronic pansinusitis: Secondary | ICD-10-CM | POA: Diagnosis present

## 2021-08-21 DIAGNOSIS — I5032 Chronic diastolic (congestive) heart failure: Secondary | ICD-10-CM | POA: Diagnosis present

## 2021-08-21 DIAGNOSIS — R5383 Other fatigue: Secondary | ICD-10-CM | POA: Diagnosis not present

## 2021-08-21 DIAGNOSIS — Z7901 Long term (current) use of anticoagulants: Secondary | ICD-10-CM

## 2021-08-21 DIAGNOSIS — Z79899 Other long term (current) drug therapy: Secondary | ICD-10-CM

## 2021-08-21 LAB — CBC WITH DIFFERENTIAL/PLATELET
Abs Immature Granulocytes: 0.05 10*3/uL (ref 0.00–0.07)
Basophils Absolute: 0 10*3/uL (ref 0.0–0.1)
Basophils Relative: 0 %
Eosinophils Absolute: 0.1 10*3/uL (ref 0.0–0.5)
Eosinophils Relative: 1 %
HCT: 32.1 % — ABNORMAL LOW (ref 36.0–46.0)
Hemoglobin: 9.6 g/dL — ABNORMAL LOW (ref 12.0–15.0)
Immature Granulocytes: 1 %
Lymphocytes Relative: 16 %
Lymphs Abs: 1.2 10*3/uL (ref 0.7–4.0)
MCH: 26.1 pg (ref 26.0–34.0)
MCHC: 29.9 g/dL — ABNORMAL LOW (ref 30.0–36.0)
MCV: 87.2 fL (ref 80.0–100.0)
Monocytes Absolute: 0.7 10*3/uL (ref 0.1–1.0)
Monocytes Relative: 9 %
Neutro Abs: 5.5 10*3/uL (ref 1.7–7.7)
Neutrophils Relative %: 73 %
Platelets: 244 10*3/uL (ref 150–400)
RBC: 3.68 MIL/uL — ABNORMAL LOW (ref 3.87–5.11)
RDW: 14.3 % (ref 11.5–15.5)
WBC: 7.6 10*3/uL (ref 4.0–10.5)
nRBC: 0 % (ref 0.0–0.2)

## 2021-08-21 LAB — TROPONIN I (HIGH SENSITIVITY)
Troponin I (High Sensitivity): 15 ng/L (ref <18)
Troponin I (High Sensitivity): 14 ng/L (ref <18)

## 2021-08-21 LAB — BASIC METABOLIC PANEL
Anion gap: 12 (ref 5–15)
BUN: 28 mg/dL — ABNORMAL HIGH (ref 8–23)
CO2: 27 mmol/L (ref 22–32)
Calcium: 9.3 mg/dL (ref 8.9–10.3)
Chloride: 97 mmol/L — ABNORMAL LOW (ref 98–111)
Creatinine, Ser: 1.54 mg/dL — ABNORMAL HIGH (ref 0.44–1.00)
GFR, Estimated: 34 mL/min — ABNORMAL LOW (ref >60)
Glucose, Bld: 211 mg/dL — ABNORMAL HIGH (ref 70–99)
Potassium: 4.8 mmol/L (ref 3.5–5.1)
Sodium: 136 mmol/L (ref 135–145)

## 2021-08-21 LAB — DIGOXIN LEVEL: Digoxin Level: 0.3 ng/mL — ABNORMAL LOW (ref 0.8–2.0)

## 2021-08-21 LAB — GLUCOSE, CAPILLARY: Glucose-Capillary: 103 mg/dL — ABNORMAL HIGH (ref 70–99)

## 2021-08-21 LAB — RESP PANEL BY RT-PCR (FLU A&B, COVID) ARPGX2
Influenza A by PCR: NEGATIVE
Influenza B by PCR: NEGATIVE
SARS Coronavirus 2 by RT PCR: POSITIVE — AB

## 2021-08-21 LAB — SEDIMENTATION RATE: Sed Rate: 88 mm/hr — ABNORMAL HIGH (ref 0–30)

## 2021-08-21 LAB — BRAIN NATRIURETIC PEPTIDE: B Natriuretic Peptide: 45.7 pg/mL (ref 0.0–100.0)

## 2021-08-21 MED ORDER — SODIUM CHLORIDE 0.9% FLUSH
3.0000 mL | Freq: Two times a day (BID) | INTRAVENOUS | Status: DC
  Administered 2021-08-21 – 2021-08-26 (×11): 3 mL via INTRAVENOUS

## 2021-08-21 MED ORDER — ACETAMINOPHEN 325 MG PO TABS
650.0000 mg | ORAL_TABLET | Freq: Four times a day (QID) | ORAL | Status: DC | PRN
  Administered 2021-08-23 – 2021-08-27 (×5): 650 mg via ORAL
  Filled 2021-08-21 (×5): qty 2

## 2021-08-21 MED ORDER — AMIODARONE HCL 200 MG PO TABS
200.0000 mg | ORAL_TABLET | Freq: Every day | ORAL | Status: DC
  Administered 2021-08-22 – 2021-08-27 (×6): 200 mg via ORAL
  Filled 2021-08-21 (×6): qty 1

## 2021-08-21 MED ORDER — ALBUTEROL SULFATE (2.5 MG/3ML) 0.083% IN NEBU: 2.5000 mL | INHALATION_SOLUTION | Freq: Four times a day (QID) | RESPIRATORY_TRACT | Status: DC | PRN

## 2021-08-21 MED ORDER — SILDENAFIL CITRATE 20 MG PO TABS
20.0000 mg | ORAL_TABLET | Freq: Three times a day (TID) | ORAL | Status: DC
Start: 2021-08-21 — End: 2021-08-27
  Administered 2021-08-21 – 2021-08-27 (×16): 20 mg via ORAL
  Filled 2021-08-21 (×20): qty 1

## 2021-08-21 MED ORDER — SPIRONOLACTONE 25 MG PO TABS
12.5000 mg | ORAL_TABLET | Freq: Every day | ORAL | Status: DC
  Administered 2021-08-22: 12.5 mg via ORAL
  Filled 2021-08-21: qty 1
  Filled 2021-08-21: qty 0.5

## 2021-08-21 MED ORDER — ACETAMINOPHEN 325 MG PO TABS: 325.0000 mg | ORAL_TABLET | Freq: Four times a day (QID) | ORAL | Status: DC | PRN

## 2021-08-21 MED ORDER — SODIUM CHLORIDE 0.9 % IV SOLN: 250.0000 mL | INTRAVENOUS | Status: DC | PRN

## 2021-08-21 MED ORDER — CEFADROXIL 500 MG PO CAPS: 500.0000 mg | ORAL_CAPSULE | Freq: Two times a day (BID) | ORAL | Status: DC

## 2021-08-21 MED ORDER — ONDANSETRON HCL 4 MG/2ML IJ SOLN
4.0000 mg | Freq: Four times a day (QID) | INTRAMUSCULAR | Status: DC | PRN
Start: 2021-08-21 — End: 2021-08-27
  Administered 2021-08-25 – 2021-08-26 (×2): 4 mg via INTRAVENOUS
  Filled 2021-08-21 (×2): qty 2

## 2021-08-21 MED ORDER — MACITENTAN 10 MG PO TABS
10.0000 mg | ORAL_TABLET | Freq: Every day | ORAL | Status: DC
  Administered 2021-08-22 – 2021-08-27 (×6): 10 mg via ORAL
  Filled 2021-08-21 (×6): qty 1

## 2021-08-21 MED ORDER — CEFTRIAXONE SODIUM 2 G IJ SOLR
2.0000 g | INTRAMUSCULAR | Status: DC
  Administered 2021-08-21 – 2021-08-25 (×5): 2 g via INTRAVENOUS
  Filled 2021-08-21 (×2): qty 20
  Filled 2021-08-21: qty 2
  Filled 2021-08-21 (×3): qty 20

## 2021-08-21 MED ORDER — DIGOXIN 125 MCG PO TABS
0.1250 mg | ORAL_TABLET | Freq: Every day | ORAL | Status: DC
  Administered 2021-08-22 – 2021-08-27 (×6): 0.125 mg via ORAL
  Filled 2021-08-21 (×6): qty 1

## 2021-08-21 MED ORDER — TORSEMIDE 20 MG PO TABS
60.0000 mg | ORAL_TABLET | Freq: Two times a day (BID) | ORAL | Status: DC
  Administered 2021-08-21 – 2021-08-22 (×2): 60 mg via ORAL
  Filled 2021-08-21: qty 3

## 2021-08-21 MED ORDER — ONDANSETRON HCL 4 MG PO TABS
4.0000 mg | ORAL_TABLET | Freq: Four times a day (QID) | ORAL | Status: DC | PRN
Start: 2021-08-21 — End: 2021-08-27

## 2021-08-21 MED ORDER — DULOXETINE HCL 60 MG PO CPEP
60.0000 mg | ORAL_CAPSULE | Freq: Every day | ORAL | Status: DC
  Administered 2021-08-21 – 2021-08-27 (×7): 60 mg via ORAL
  Filled 2021-08-21 (×7): qty 1

## 2021-08-21 MED ORDER — POTASSIUM CHLORIDE ER 10 MEQ PO TBCR: 20.0000 meq | EXTENDED_RELEASE_TABLET | Freq: Every day | ORAL | Status: DC

## 2021-08-21 MED ORDER — SODIUM CHLORIDE 0.9% FLUSH: 3.0000 mL | INTRAVENOUS | Status: DC | PRN

## 2021-08-21 MED ORDER — SODIUM CHLORIDE 0.9 % IV SOLN
3.0000 g | Freq: Once | INTRAVENOUS | Status: AC
  Administered 2021-08-21: 3 g via INTRAVENOUS
  Filled 2021-08-21: qty 8

## 2021-08-21 MED ORDER — ACETAMINOPHEN 650 MG RE SUPP: 650.0000 mg | Freq: Four times a day (QID) | RECTAL | Status: DC | PRN

## 2021-08-21 MED ORDER — GABAPENTIN 300 MG PO CAPS
600.0000 mg | ORAL_CAPSULE | Freq: Three times a day (TID) | ORAL | Status: DC
  Administered 2021-08-21 – 2021-08-27 (×16): 600 mg via ORAL
  Filled 2021-08-21 (×16): qty 2

## 2021-08-21 MED ORDER — APIXABAN 5 MG PO TABS
5.0000 mg | ORAL_TABLET | Freq: Two times a day (BID) | ORAL | Status: DC
  Administered 2021-08-21 – 2021-08-27 (×12): 5 mg via ORAL
  Filled 2021-08-21 (×12): qty 1

## 2021-08-21 MED ORDER — SELEXIPAG 1600 MCG PO TABS
1600.0000 ug | ORAL_TABLET | Freq: Two times a day (BID) | ORAL | Status: DC
  Administered 2021-08-22 – 2021-08-27 (×10): 1600 ug via ORAL
  Filled 2021-08-21 (×11): qty 1

## 2021-08-21 MED ORDER — MECLIZINE HCL 25 MG PO TABS
25.0000 mg | ORAL_TABLET | Freq: Three times a day (TID) | ORAL | Status: DC | PRN
  Filled 2021-08-21: qty 1

## 2021-08-21 MED ORDER — DIFLUPREDNATE 0.05 % OP EMUL: 1.0000 [drp] | Freq: Two times a day (BID) | OPHTHALMIC | Status: DC

## 2021-08-21 MED ORDER — MIDODRINE HCL 5 MG PO TABS
5.0000 mg | ORAL_TABLET | Freq: Three times a day (TID) | ORAL | Status: DC
Start: 2021-08-22 — End: 2021-08-23
  Administered 2021-08-22 – 2021-08-23 (×4): 5 mg via ORAL
  Filled 2021-08-21 (×4): qty 1

## 2021-08-21 MED ORDER — ROSUVASTATIN CALCIUM 10 MG PO TABS
10.0000 mg | ORAL_TABLET | Freq: Every day | ORAL | Status: DC
  Administered 2021-08-22 – 2021-08-27 (×6): 10 mg via ORAL
  Filled 2021-08-21 (×6): qty 1

## 2021-08-21 NOTE — ED Notes (Signed)
Pt placed on purewick and informed of need for urine sample ?

## 2021-08-21 NOTE — Assessment & Plan Note (Signed)
History of paroxysmal A-fib ?Continue amiodarone and digoxin for rate control ?Continue Eliquis as primary prophylaxis for an acute stroke ?

## 2021-08-21 NOTE — ED Triage Notes (Signed)
Pt reports dizziness x a few weeks, and nausea since Saturday. Pt has extensive cardiac history and recent hospitalization. Pt currently on abx for pacemaker infection.   ?

## 2021-08-21 NOTE — ED Triage Notes (Signed)
Pt reports they had covid two weeks ago and were in the hospital for it.  ?

## 2021-08-21 NOTE — H&P (Addendum)
History and Physical    Patient: Debra Saunders TDH:[id number] DOB: [birth year] DOA: 08/21/2021 DOS: the patient was seen and examined on 08/22/2021 PCP: [name], MD  Patient coming from: Home  Chief Complaint:  Chief Complaint  Patient presents with   Dizziness    HPI: Debra Saunders is a 76 y.o. female with medical history significant for recent endocarditis, pacemaker site/wire infection discharged to subacute rehab for IV antibiotic therapy and returned home about a week prior to admission, history of chronic respiratory failure on 4 L of oxygen continuous history of chronic diastolic dysfunction CHF, history of A-fib status post DCCV, history of interstitial lung disease, pulmonary hypertension who was brought into the ER by EMS for evaluation of dizziness which she states that she has chronically but this episode is different.  She states that over the last several days she gets dizzy with any form of movement and her gait is very unsteady despite the use of an assist device.  She is having to hold onto her daughter to prevent her from falling.  She complains of a lot of postnasal drip as well as noises in her ears.  She complains of feeling very weak and tired. She has not had any falls and denies having any nausea, no vomiting, no fever, no chills, no abdominal pain, no changes in her bowel habits, no leg swelling, no blurred vision, no focal deficit.  Review of Systems: As mentioned in the history of present illness. All other systems reviewed and are negative. Past Medical History:  Diagnosis Date   Arthritis    Atrial fibrillation, persistent ([place])    a. s/p TEE-guided DCCV on 08/20/2017   Cardiorenal syndrome    a. 09/2017 Creat rose to 2.7 w/ diuresis-->HD x 2.   Chest pain    a. 01/2012 Cath: nonobs dzs;  c. 04/2014 MV: No ischemia.   Chronic back pain    Chronic combined systolic (congestive) and diastolic (congestive) heart failure ([place])    a. Dx 2005 @ [facility];  b.  02/2014 Echo: EF 55-60%; c. 07/2017: EF 30-35%, diffuse HK, trivial AI, severe MR, moderate TR; d. 09/2017 Echo: EF 40-45%; e. 02/2018 Echo: EF 55-60%, Gr1 DD; f. 08/2019 Echo: EF 60-65%, Gr2 DD, Nl RV fxn, RVSP 49.26mmHg. Mod-Sev MR. Mild to mod TR. Mod PR. RA ~ 82mmHg.   Chronic respiratory failure (HCC)    a. on home O2   Gastritis    History of intracranial hemorrhage    a. 6/14 described by neurosurgery as "small frontal posttraumatic SAH " - pt was on xarelto @ the time.   Hyperlipidemia    Hypertension    Interstitial lung disease (HCC)    Lipoma of back    Lymphedema    a. Chronic LLE edema.   NICM (nonischemic cardiomyopathy) ([place])    a. 09/2017 Echo: EF 40-45%; b. 09/2017 Cath: minor, insignificant CAD; c. 02/2018 Echo: EF 55-60%, Gr1 DD; d. 08/2019 Echo: EF 60-65%, Gr2 DD.   Obesity    PAH (pulmonary artery hypertension) ([place])    a. 09/2017 Echo: PASP 40mmHg; b. 09/2017 RHC: RA 23, RV 84/13, PA 84/29, PCWP 20, CO 3.89, CI 1.89. LVEDP 18.   Presence of permanent cardiac pacemaker 01/09/2014   Sepsis with metabolic encephalopathy ([place])    Sleep apnea    Streptococcal bacteremia    Stroke [facility]) 2014   Subarachnoid hemorrhage ([place]) 01/19/2013   Symptomatic bradycardia    a. s/p BSX PPM.   Thyroid  nodule    Type II diabetes mellitus ([place])    Urinary incontinence    Past Surgical History:  Procedure Laterality Date   ABDOMINAL HYSTERECTOMY  1969   BREAST BIOPSY Right 10/28/2019   lymph node bx, [place] Bx, pending path    CARDIAC CATHETERIZATION  2013   @ [facility]: No obstructive CAD: Only 20% ostial left circumflex.   CARDIOVERSION N/A 10/12/2017   Procedure: CARDIOVERSION;  Surgeon: Minna Merritts, MD;  Location: [place] [facility];  Service: Cardiovascular;  Laterality: N/A;   CARDIOVERSION N/A 07/04/2021   Procedure: CARDIOVERSION;  Surgeon: Larey Dresser, MD;  Location: [facility] ENDOSCOPY;  Service: Cardiovascular;  Laterality: N/A;   CATARACT EXTRACTION W/PHACO Right 09/19/2020   Procedure: CATARACT  EXTRACTION PHACO AND INTRAOCULAR LENS PLACEMENT (IOC) RIGHT DIABETIC 3.95 00:43.0 9.2%;  Surgeon: Leandrew Koyanagi, MD;  Location: [place];  Service: Ophthalmology;  Laterality: Right;  Pt requests to be last sleep apnea   CATARACT EXTRACTION W/PHACO Left 10/03/2020   Procedure: CATARACT EXTRACTION PHACO AND INTRAOCULAR LENS PLACEMENT ([place]) LEFT DIABETIC;  Surgeon: Leandrew Koyanagi, MD;  Location: [place];  Service: Ophthalmology;  Laterality: Left;  3.25 0:59.3 5.5%   CHOLECYSTECTOMY     COLONOSCOPY WITH PROPOFOL N/A 09/10/2018   Procedure: COLONOSCOPY WITH PROPOFOL;  Surgeon: Manya Silvas, MD;  Location: [facility] ENDOSCOPY;  Service: Endoscopy;  Laterality: N/A;   CYST REMOVAL TRUNK  2002   BACK   DIALYSIS/PERMA CATHETER INSERTION N/A 10/06/2017   Procedure: DIALYSIS/PERMA CATHETER INSERTION;  Surgeon: Katha Cabal, MD;  Location: [place] CV LAB;  Service: Cardiovascular;  Laterality: N/A;   INSERT / REPLACE / REMOVE PACEMAKER     lipoma removal  2014   back   PACEMAKER INSERTION  2015   [employer] dual chamber pacemaker implanted by Dr Caryl Comes for symptomatic bradycardia   PERMANENT PACEMAKER INSERTION N/A 01/09/2014   Procedure: PERMANENT PACEMAKER INSERTION;  Surgeon: Deboraha Sprang, MD;  Location: [facility] CATH LAB;  Service: Cardiovascular;  Laterality: N/A;   RIGHT HEART CATH N/A 04/19/2018   Procedure: RIGHT HEART CATH;  Surgeon: Larey Dresser, MD;  Location: [place] CV LAB;  Service: Cardiovascular;  Laterality: N/A;   RIGHT HEART CATH N/A 08/30/2020   Procedure: RIGHT HEART CATH;  Surgeon: Larey Dresser, MD;  Location: [place] [facility];  Service: Cardiovascular;  Laterality: N/A;   RIGHT/LEFT HEART CATH AND CORONARY ANGIOGRAPHY N/A 10/19/2017   Procedure: RIGHT/LEFT HEART CATH AND CORONARY ANGIOGRAPHY;  Surgeon: Wellington Hampshire, MD;  Location: [place] [facility];  Service: Cardiovascular;  Laterality: N/A;   TEE WITHOUT  CARDIOVERSION N/A 08/20/2017   Procedure: TRANSESOPHAGEAL ECHOCARDIOGRAM (TEE) & Direct current cardioversion;  Surgeon: Minna Merritts, MD;  Location: [facility] ORS;  Service: Cardiovascular;  Laterality: N/A;   TEE WITHOUT CARDIOVERSION N/A 06/28/2021   Procedure: TRANSESOPHAGEAL ECHOCARDIOGRAM (TEE);  Surgeon: Minna Merritts, MD;  Location: [facility] ORS;  Service: Cardiovascular;  Laterality: N/A;   TEE WITHOUT CARDIOVERSION N/A 07/04/2021   Procedure: TRANSESOPHAGEAL ECHOCARDIOGRAM (TEE);  Surgeon: Larey Dresser, MD;  Location: [facility] ENDOSCOPY;  Service: Cardiovascular;  Laterality: N/A;   TRIGGER FINGER RELEASE     Social History:  reports that she quit smoking about 31 years ago. Her smoking use included cigarettes. She has a 0.60 pack-year smoking history. She has never used smokeless tobacco. She reports that she does not drink alcohol and does not use drugs.  Allergies  Allergen Reactions   Aspirin Anaphylaxis and Hives  Pt states that she is unable to take because she had bleeding in her brain   Other Other (See Comments)    Pt states that she is unable to take blood thinners because she had bleeding in her brain; Blood Thinners-per doctor at [facility] (Xarelto)   Penicillins Hives, Shortness Of Breath, Swelling and Other (See Comments)    Has patient had a PCN reaction causing immediate rash, facial/tongue/throat swelling, SOB or lightheadedness with hypotension: Yes Has patient had a PCN reaction causing severe rash involving mucus membranes or skin necrosis: No Has patient had a PCN reaction that required hospitalization No Has patient had a PCN reaction occurring within the last 10 years: No If all of the above answers are "NO", then may proceed with Cephalosporin use.    Family History  Problem Relation Age of Onset   Heart disease Mother    Hypertension Mother    COPD Mother        was a smoker   Thyroid disease Mother    Hypertension Father    Hypertension Sister     Thyroid disease Sister    Breast cancer Sister    Hypertension Sister    Thyroid disease Sister    Bone cancer Sister    Breast cancer Maternal Aunt    Kidney disease Neg Hx    Bladder Cancer Neg Hx     Prior to Admission medications   Medication Sig Start Date End Date Taking? Authorizing Provider  acetaminophen (TYLENOL) 325 MG tablet Take 325-650 mg by mouth every 6 (six) hours as needed for mild pain.    [provider]  albuterol (ACCUNEB) 0.63 MG/3ML nebulizer solution Take 1 ampule by nebulization every 6 (six) hours as needed for wheezing.    [provider]  amiodarone (PACERONE) 200 MG tablet Take 1 tablet (200 mg total) by mouth daily. 08/14/21   Larey Dresser, MD  apixaban (ELIQUIS) 5 MG TABS tablet Take 5 mg by mouth 2 (two) times daily.    [provider]  benzonatate (TESSALON) 200 MG capsule Take 1 capsule (200 mg total) by mouth 3 (three) times daily as needed for cough. 07/09/21   Rai, Vernelle Emerald, MD  cefadroxil (DURICEF) 500 MG capsule Take 500 mg by mouth 2 (two) times daily.    [provider]  cholestyramine light (PREVALITE) 4 g packet Take 1 packet (4 g total) by mouth daily as needed (diarrhea). 07/09/21   Rai, Ripudeep K, MD  Difluprednate 0.05 % EMUL Place 1 drop into both eyes 2 (two) times daily.    [provider]  Digoxin 62.5 MCG TABS Take 0.0625 mg by mouth daily. 07/12/21   Alma Friendly, MD  DULoxetine (CYMBALTA) 30 MG capsule TAKE 1 CAPSULE BY MOUTH ONCE DAILY FOR 14 DAYS, THEN INCREASE TO 2 CAPSULES ONCE DAILY Patient taking differently: Take 30 mg by mouth daily. 06/06/21   [name], MD  gabapentin (NEURONTIN) 300 MG capsule Take 600 mg by mouth 3 (three) times daily.    [provider]  macitentan (OPSUMIT) 10 MG tablet Take 1 tablet (10 mg total) by mouth daily. 07/26/21   Larey Dresser, MD  Magnesium 200 MG TABS Take 1 tablet by mouth in the morning and at bedtime.    [provider]  meclizine (ANTIVERT) 25 MG tablet Take 1 tablet (25 mg total) by mouth 3 (three) times daily as needed for dizziness. 12/25/20   Larey Dresser, MD  Menthol, Topical Analgesic, (BIOFREEZE EX) Apply 1 application topically 4 (four) times daily as needed (for pain).    [provider]  midodrine (PROAMATINE) 5 MG tablet Take 1 tablet (5 mg total) by mouth 3 (three) times daily with meals. 08/14/21   Larey Dresser, MD  pantoprazole (PROTONIX) 40 MG tablet Take 40 mg by mouth daily.    [provider]  potassium chloride (KLOR-CON) 10 MEQ tablet Take 2 tablets (20 mEq total) by mouth daily. Patient taking differently: Take 40 mEq by mouth in the morning, at noon, and at bedtime. 07/22/21   Rafael Bihari, FNP  rosuvastatin (CRESTOR) 10 MG tablet Take 1 tablet by mouth once daily 06/06/21   Larey Dresser, MD  Selexipag (UPTRAVI) 1600 MCG TABS Take 1 tablet (1,600 mcg total) by mouth 2 (two) times daily. 07/30/21   Larey Dresser, MD  sildenafil (REVATIO) 20 MG tablet Take 20 mg by mouth 3 (three) times daily.    [provider]  spironolactone (ALDACTONE) 25 MG tablet Take 12.5 mg by mouth daily.    [provider]  Torsemide 40 MG TABS Take 60 mg by mouth 2 (two) times daily. 07/22/21   Rafael Bihari, FNP    Physical Exam: Vitals:   08/21/21 1832 08/21/21 2003 08/22/21 0025 08/22/21 0416  BP: (!) 116/47 (!) 108/57 (!) 123/45 126/61  Pulse: (!) 59 (!) 59 60 60  Resp: 18 18 16 14   Temp: 100 F (37.8 C) 100.3 F (37.9 C) 98.4 F (36.9 C) 97.8 F (36.6 C)  TempSrc:  Oral    SpO2: 98% 97% 97% 99%  Height:       Physical Exam Vitals and nursing note reviewed.  Constitutional:      Appearance: Normal appearance. She is normal weight.     Comments: Chronically ill-appearing  HENT:     Head: Normocephalic and atraumatic.     Nose: Nose normal.  Cardiovascular:     Rate and Rhythm: Normal rate and regular rhythm.     Heart  sounds: Murmur heard.  Musculoskeletal:     Left lower leg: Edema present.  Skin:    General: Skin is warm and dry.  Neurological:     General: No focal deficit present.     Mental Status: She is alert and oriented to person, place, and time.     Motor: Weakness present.  Psychiatric:        Mood and Affect: Mood normal.        Behavior: Behavior normal.     Data Reviewed: Data Reviewed: Relevant notes from primary care and specialist visits, past discharge summaries as available in EHR, including Care Everywhere. Prior diagnostic testing as pertinent to current admission diagnoses Updated medications and problem lists for reconciliation ED course, including vitals, labs, imaging, treatment and response to treatment Triage notes, nursing and pharmacy notes and ED provider's notes Notable results as noted in HPI Labs reviewed BNP 45.7, sodium 136, chloride 97, potassium 4.8, glucose 211, BUN 28, creatinine 1.5, sed rate 88, hemoglobin 9.6, hematocrit 32.1 Respiratory viral panel is positive for SARS coronavirus 2 CT scan of the head without contrast shows no acute intracranial process identified. Chronic microvascular ischemic changes. Evidence of acute pansinusitis as well as extensive opacification in the bilateral mastoid air cells, correlate clinically. Chest x-ray reviewed by me shows enlargement of cardiac silhouette with pulmonary vascular congestion and persistent interstitial infiltrates favoring pulmonary edema. Twelve-lead EKG reviewed by me shows sinus  rhythm with low voltage precordial leads There are no new results to review at this time.  Assessment and Plan: * Vertigo Patient presents for evaluation of vertigo and is found to have pansinusitis We will place patient on antibiotic therapy for treatment of her pansinusitis Supportive care with meclizine and intranasal steroids  Endocarditis Patient recently treated for endocarditis/infection due to implanted cardiac  device. Recently completed IV Cefazolin through 08/08/21 and currently of Cefadroxil 500 mg BID. Patient is currently on IV Rocephin for her pansinusitis and will need to be restarted on her Cefadroxil upon discharge.  History of COVID-19 Patient had COVID-19 viral infection in January while in subacute rehab Her COVID-19 PCR test is positive during this hospitalization COVID-19 cycle threshold is 38 and patient is no longer considered infectious No indication for isolation at this time  Generalized weakness Secondary to chronic illness and acutely worsened by vertigo Place patient on fall precautions PT evaluation  Chronic diastolic CHF (congestive heart failure) ([place])- (present on admission) Patient has a history of chronic diastolic dysfunction CHF Continue torsemide 60 mg twice daily as well as spironolactone  Obesity (BMI 30-39.9)- (present on admission) Complicates overall prognosis and care Lifestyle modification and exercise has been discussed with patient in detail  Atrial fibrillation with tachy brady syndrome - (present on admission) History of paroxysmal A-fib Continue amiodarone and digoxin for rate control Continue Eliquis as primary prophylaxis for an acute stroke  Interstitial lung disease ([place])- (present on admission) Chronic Associated with pulmonary hypertension Continue sildenafil,selexipag and macitentan  Chronic respiratory failure ([place])- (present on admission) Patient has a history of chronic respiratory failure secondary to interstitial lung disease Continue oxygen supplementation at 3 L to maintain pulse oximetry greater than 92%       Advance Care Planning:   Code Status: Full Code   Consults: None  Family Communication: Greater than 50% of time was spent discussing patient's condition and plan of care with her and her daughter at the bedside.  All questions and concerns have been addressed.  They verbalized understanding and agree with the  plan.  Severity of Illness: The appropriate patient status for this patient is INPATIENT. Inpatient status is judged to be reasonable and necessary in order to provide the required intensity of service to ensure the patient's safety. The patient's presenting symptoms, physical exam findings, and initial radiographic and laboratory data in the context of their chronic comorbidities is felt to place them at high risk for further clinical deterioration. Furthermore, it is not anticipated that the patient will be medically stable for discharge from the hospital within 2 midnights of admission.   * I certify that at the point of admission it is my clinical judgment that the patient will require inpatient hospital care spanning beyond 2 midnights from the point of admission due to high intensity of service, high risk for further deterioration and high frequency of surveillance required.*  Author: Collier Bullock, MD 08/22/2021 5:22 AM  For on call review [email or web address].

## 2021-08-21 NOTE — ED Provider Notes (Signed)
[facility] Provider Note   Event Date/Time   First MD Initiated Contact with Patient 08/21/21 1358     (approximate)  History   Dizziness   HPI  Debra Saunders is a 76 y.o. female with a recently complex medical history including endocarditis, pacemaker site or wire infection, prolonged hospitalization for sepsis discharged in January to nursing facility continue antibiotic  For several days now she is experienced feeling of weakness fatigue has been increasing also a feeling of a "sloshing" feeling in her head nasal congestion and difficulty hearing a muffled feeling in her ears.  She is not any chest pain.  She does continue to endorse slight shortness of breath, weakness fatigue and dyspnea with any attempted exertion.  Uses oxygen 4 L at baseline, currently on same.  Lower extremity swelling including chronic swelling of her left lower leg which is unchanged or perhaps even slightly better than typical for her and her daughter  She has had fever last reporting having a fever about 101 roughly a week ago and continues to have night sweats chills and fatigue now for over a week     Physical Exam   Triage Vital Signs: ED Triage Vitals  Enc Vitals Group     BP 08/21/21 1337 (!) 119/48     Pulse Rate 08/21/21 1337 60     Resp 08/21/21 1337 20     Temp 08/21/21 1337 99.1 F (37.3 C)     Temp Source 08/21/21 1337 Oral     SpO2 08/21/21 1337 100 %     Weight --      Height 08/21/21 1337 5' (1.524 m)     Head Circumference --      [facility] --      Pain Score 08/21/21 1333 0     Pain Loc --      Pain Edu? --      Excl. in [place]? --     Most recent vital signs: Vitals:   08/21/21 1517 08/21/21 1519  BP: (!) 104/56   Pulse: 65 60  Resp: 19 19  Temp:    SpO2: 95% 95%     General: Awake, no distress.  Very pleasant she and her daughter both very amicable.  Appears slightly dyspneic.  No acute distress though CV:  Good peripheral perfusion.   Patient has a moderate systolic ejection murmur Resp:  Normal effort.  Mildly diminished lung sounds at bases bilaterally.  Slight use of accessory muscles.  Speaks in phrases without distress though.  Oxygen saturation normal on her baseline oxygen Abd:  No distention.  Other:  Mild left greater than right lower extremity edema, patient reports this to be chronic Mild bilateral coryza and sinusitis.  Right tympanic membrane normal, left tympanic membrane with bubbles noted behind the tympanic membrane  ED Results / Procedures / Treatments   Labs (all labs ordered are listed, but only abnormal results are displayed) Labs Reviewed  CBC WITH DIFFERENTIAL/PLATELET - Abnormal; Notable for the following components:      Result Value   RBC 3.68 (*)    Hemoglobin 9.6 (*)    HCT 32.1 (*)    MCHC 29.9 (*)    All other components within normal limits  BASIC METABOLIC PANEL - Abnormal; Notable for the following components:   Chloride 97 (*)    Glucose, Bld 211 (*)    BUN 28 (*)    Creatinine, Ser 1.54 (*)    GFR, Estimated  34 (*)    All other components within normal limits  SEDIMENTATION RATE - Abnormal; Notable for the following components:   Sed Rate 88 (*)    All other components within normal limits  RESP PANEL BY RT-PCR (FLU A&B, COVID) [id number]  CULTURE, BLOOD (ROUTINE X 2)  CULTURE, BLOOD (ROUTINE X 2)  URINALYSIS, COMPLETE (UACMP) WITH MICROSCOPIC  BRAIN NATRIURETIC PEPTIDE  TROPONIN I (HIGH SENSITIVITY)  TROPONIN I (HIGH SENSITIVITY)     EKG  Reviewed inter by me at 1347 Heart rate 69 QRS 90 QTc 09/25/1960 Probable ventricular pacing with atrial sensing.  Inferolateral T wave inversions, compared with previous morphology is same   RADIOLOGY DG Chest 2 View  Result Date: 08/21/2021 CLINICAL DATA:  Fatigue, recent endocarditis, dizziness for few weeks, nausea since Saturday EXAM: CHEST - 2 VIEW COMPARISON:  07/03/2021 FINDINGS: LEFT subclavian sequential transvenous pacemaker  leads project at RIGHT atrium and RIGHT ventricle. Enlargement of cardiac silhouette with pulmonary vascular congestion. Atherosclerotic calcification aorta. Scattered interstitial infiltrates in both lungs favoring pulmonary edema. No pleural effusion or pneumothorax. Mild persistent elevation of RIGHT diaphragm. No acute osseous findings. IMPRESSION: Enlargement of cardiac silhouette with pulmonary vascular congestion and persistent interstitial infiltrates favoring pulmonary edema. Aortic Atherosclerosis (ICD10-I70.0). Electronically Signed   By: Lavonia Dana M.D.   On: 08/21/2021 15:07   CT Head Wo Contrast  Result Date: 08/21/2021 CLINICAL DATA:  Dizziness EXAM: CT HEAD WITHOUT CONTRAST TECHNIQUE: Contiguous axial images were obtained from the base of the skull through the vertex without intravenous contrast. RADIATION DOSE REDUCTION: This exam was performed according to the departmental dose-optimization program which includes automated exposure control, adjustment of the mA and/or kV according to patient size and/or use of iterative reconstruction technique. COMPARISON:  CT head 03/30/2015 FINDINGS: Brain: No acute intracranial hemorrhage, mass effect, or herniation. No extra-axial fluid collections. No evidence of acute territorial infarct. No hydrocephalus. Mild patchy hypodensities in the periventricular and subcortical white matter appears stable and likely represent chronic microvascular ischemic changes. Vascular: Calcified plaques in the carotid siphons. Skull: Normal. Negative for fracture or focal lesion. Sinuses/Orbits: Air-fluid level seen throughout all of the paranasal sinuses as well as moderate to severe opacification of the ethmoid air cells. Extensive opacification also seen in the bilateral mastoid air cells. Other: None. IMPRESSION: 1. No acute intracranial process identified. Chronic microvascular ischemic changes. 2. Evidence of acute pansinusitis as well as extensive opacification in  the bilateral mastoid air cells, correlate clinically. Electronically Signed   By: Ofilia Neas M.D.   On: 08/21/2021 14:44    CT of the head reviewed, notable for pansinusitis  Chest x-ray personally viewed and interpreted by me, pulmonary vascular congestion and interstitial infiltrates     PROCEDURES:  Critical Care performed: No  Procedures   MEDICATIONS ORDERED IN ED: Medications  Ampicillin-Sulbactam (UNASYN) 3 g in sodium chloride 0.9 % 100 mL IVPB (3 g Intravenous New Bag/Given 08/21/21 1556)     IMPRESSION / MDM / ASSESSMENT AND PLAN / ED COURSE  I reviewed the triage vital signs and the nursing notes.                              Differential diagnosis includes, but is not limited to, sinusitis, worsening CHF or pulmonary edema, worsening valvular disease, ongoing subacute endocarditis, ACS, other acute infectious or metabolic abnormalities anemia etc.  Her lab evaluation demonstrates normal white count and elevated sedimentation rate, CT scan  of her head concerning for pansinusitis and given her clinical history and exam of the tympanic membranes and sinuses I suspect she does have acute and ongoing pansinusitis.  I discussed her care and her previous history as well as today's evaluation with Dr. Juleen China of infectious disease, and it would be reasonable to admit her for IV Unasyn and a repeat echocardiogram and further work-up given her recent history of endocarditis.  My suspicion at this point is its unlikely she has ongoing endocarditis or obvious bacteremia, but we cannot rule this out at this point and she does have demonstrated bacterial sinusitis.  We discussed her penicillin allergy and apparently this has been evaluated and she has been challenged with amoxicillin in January Past this per documentation on January 12.  I discussed with the patient as well as her daughter, and both are agreeable understanding of the plan to treat with Unasyn, and we did discuss the  risks of this including risk of allergic reaction for which the patient and her daughter are understanding.  The patient is on the cardiac monitor to evaluate for evidence of arrhythmia and/or significant heart rate changes.   Admission and care plan including recommendation for further work-up including echocardiogram are discussed with Dr. Francine Graven who accepts admission.      FINAL CLINICAL IMPRESSION(S) / ED DIAGNOSES   Final diagnoses:  Heart murmur, systolic  Acute pansinusitis, recurrence not specified     Rx / DC Orders   ED Discharge Orders     None        Note:  This document was prepared using Dragon voice recognition software and may include unintentional dictation errors.   Delman Kitten, MD 08/21/21 [phone]

## 2021-08-21 NOTE — Progress Notes (Signed)
Cross Cover ?CPAP ordered as patient uses nightly at home for OSA ?

## 2021-08-21 NOTE — Assessment & Plan Note (Addendum)
Patient has a history of chronic diastolic dysfunction CHF ?--takes torsemide 60 mg twice daily as well as spironolactone at home.  Likely dehydrated, since BUN elevated from baseline.  Both held after morning dose on 3/2. ?Plan: ?--cont to hold torsemide and aldactone ?

## 2021-08-21 NOTE — Assessment & Plan Note (Addendum)
With chronic hypoxemic respiratory failure ?Associated with pulmonary hypertension ?--Unfortunately few options for her atrial fibrillation, will continue amiodarone, per cardio ?--Continue home sildenafil,selexipag and macitentan ?--Continue supplemental O2 to keep sats between 88-92% ? ?

## 2021-08-21 NOTE — Progress Notes (Signed)
Today had a home visit with [name].  She advised me yesterday she has been feeling bad and looking at the notes from her PCP, they advised her to go to ED 2 days ago.  She is dizzy, had some chest pain over the weekend, trouble hearing and seeing.  These are new symptoms since she has been discharged from [facility] 1 week ago.  She caught covid prior to leaving and stayed an extra week, she is 2.5 weeks out from covid.  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  She had 4 weeks of [facility] and been taking oral since discahrge from them.  She had to cancel infectious disease due to covid and has appt this Friday.  She has cardiology appt coming up also. She has been taking all her medications.  She has no fever but almost fell when I arrived from the dizziness.  She explains it does not feel like her vertigo, this is something different. Vitals are normal, she is on midodrine for blood pressure.  She started amiodarone in the hospital and was cardioverted while she was there.  Her lungs were clear and edema is down in her legs.  Her appetite has been good and gained 2 lbs since home.  Advised her to go to ED, she finally agreed.  Called [facility] and got an ambulance to transport due to dizziness and felt unsafe for her to walk to vehicle. When they arrived gave brief history, list of meds and they transported non emergency to [facility].  Will continue to visit for heart failure, diet and medication management.  ? ?Rosaleen Mazer ?[place] EMT-Paramedic ?[phone] ?

## 2021-08-21 NOTE — Assessment & Plan Note (Signed)
Patient has a history of chronic respiratory failure secondary to interstitial lung disease ?Continue oxygen supplementation at 3 L to maintain pulse oximetry greater than 92% ?

## 2021-08-21 NOTE — Assessment & Plan Note (Signed)
Complicates overall prognosis and care ?Lifestyle modification and exercise has been discussed with patient in detail ?

## 2021-08-21 NOTE — Assessment & Plan Note (Signed)
Patient had COVID-19 viral infection in January while in subacute rehab ?Her COVID-19 PCR test is positive during this hospitalization ?COVID-19 cycle threshold is 38 and patient is no longer considered infectious ?No indication for isolation at this time ?

## 2021-08-21 NOTE — Assessment & Plan Note (Addendum)
Secondary to chronic illness and acutely worsened by dizziness and inability to get out of bed ?Place patient on fall precautions ?PT evaluation ?

## 2021-08-21 NOTE — Assessment & Plan Note (Addendum)
Patient presents for evaluation of vertigo and is found to have pansinusitis on CT head ?--started on ceftriaxone  ?--d/c ceftriaxone today ?

## 2021-08-22 DIAGNOSIS — J849 Interstitial pulmonary disease, unspecified: Secondary | ICD-10-CM

## 2021-08-22 DIAGNOSIS — I951 Orthostatic hypotension: Secondary | ICD-10-CM | POA: Diagnosis not present

## 2021-08-22 DIAGNOSIS — Z8679 Personal history of other diseases of the circulatory system: Secondary | ICD-10-CM

## 2021-08-22 DIAGNOSIS — J9611 Chronic respiratory failure with hypoxia: Secondary | ICD-10-CM

## 2021-08-22 DIAGNOSIS — I38 Endocarditis, valve unspecified: Secondary | ICD-10-CM | POA: Diagnosis not present

## 2021-08-22 DIAGNOSIS — I272 Pulmonary hypertension, unspecified: Secondary | ICD-10-CM | POA: Diagnosis not present

## 2021-08-22 DIAGNOSIS — U071 COVID-19: Secondary | ICD-10-CM | POA: Diagnosis not present

## 2021-08-22 DIAGNOSIS — I480 Paroxysmal atrial fibrillation: Secondary | ICD-10-CM | POA: Diagnosis not present

## 2021-08-22 HISTORY — DX: Personal history of other diseases of the circulatory system: Z86.79

## 2021-08-22 LAB — CBC
HCT: 28.2 % — ABNORMAL LOW (ref 36.0–46.0)
Hemoglobin: 8.5 g/dL — ABNORMAL LOW (ref 12.0–15.0)
MCH: 25.9 pg — ABNORMAL LOW (ref 26.0–34.0)
MCHC: 30.1 g/dL (ref 30.0–36.0)
MCV: 86 fL (ref 80.0–100.0)
Platelets: 203 10*3/uL (ref 150–400)
RBC: 3.28 MIL/uL — ABNORMAL LOW (ref 3.87–5.11)
RDW: 14.4 % (ref 11.5–15.5)
WBC: 6.1 10*3/uL (ref 4.0–10.5)
nRBC: 0 % (ref 0.0–0.2)

## 2021-08-22 LAB — BASIC METABOLIC PANEL
Anion gap: 8 (ref 5–15)
BUN: 30 mg/dL — ABNORMAL HIGH (ref 8–23)
CO2: 29 mmol/L (ref 22–32)
Calcium: 9.2 mg/dL (ref 8.9–10.3)
Chloride: 100 mmol/L (ref 98–111)
Creatinine, Ser: 1.43 mg/dL — ABNORMAL HIGH (ref 0.44–1.00)
GFR, Estimated: 38 mL/min — ABNORMAL LOW (ref >60)
Glucose, Bld: 126 mg/dL — ABNORMAL HIGH (ref 70–99)
Potassium: 4.6 mmol/L (ref 3.5–5.1)
Sodium: 137 mmol/L (ref 135–145)

## 2021-08-22 LAB — GLUCOSE, CAPILLARY
Glucose-Capillary: 117 mg/dL — ABNORMAL HIGH (ref 70–99)
Glucose-Capillary: 181 mg/dL — ABNORMAL HIGH (ref 70–99)
Glucose-Capillary: 173 mg/dL — ABNORMAL HIGH (ref 70–99)

## 2021-08-22 MED ORDER — FLUTICASONE PROPIONATE 50 MCG/ACT NA SUSP
2.0000 | Freq: Every day | NASAL | Status: DC
  Administered 2021-08-23 – 2021-08-27 (×5): 2 via NASAL
  Filled 2021-08-22: qty 16

## 2021-08-22 MED ORDER — DIFLUPREDNATE 0.05 % OP EMUL
1.0000 [drp] | Freq: Two times a day (BID) | OPHTHALMIC | Status: DC
  Administered 2021-08-22 – 2021-08-27 (×10): 1 [drp] via OPHTHALMIC
  Filled 2021-08-22: qty 5

## 2021-08-22 NOTE — Consult Note (Signed)
Cardiology Consultation:   Patient ID: Debra Saunders MRN: [id number]; DOB: 1946-01-03  Admit date: 08/21/2021 Date of Consult: 08/22/2021  PCP:  [name], MD   [facility] Providers Cardiologist:  [name], MD  Physician requesting consult: Dr. Francine Graven Reason for consult: Symptomatic orthostasis, pulmonary hypertension  Patient Profile:   Debra Saunders is a 76 y.o. female who has a history of chronic diastolic heart failure, nonobstructive CAD, atrial fibrillation on eliquis, bradycardia s/p PPM [facility]), chronic respiratory failure on home O2, COPD, PAH, DM2, HTN, CKD (required HD in past), DVT s/p IVC filter 2004, HL, traumatic subarachnoid hemorrhage while on xarelto 2014, CVA 2014, OSA, and chronic lymphedema, presenting to the emergency room with symptomatic orthostasis/near syncope, weakness   History of Present Illness:   Full cardiac history as detailed below Followed by advanced heart failure clinic in [facility] for pulmonary hypertension Has been maintained on aggressive regimen torsemide 60 twice daily, 2 medications for pulmonary hypertension, sildenafil, Aldactone, midodrine 5 3 times daily  She reports that blood pressure typically will drop when she stands but has been able to make do while working doing rehab over the past several months, was at [facility] for some time COVID 2 weeks ago/Valentine's Day.  Since leaving [facility], profound orthostasis, unable to walk requiring assistance from friends and family.  Reports unable to work with PT  Attempt to work with PT today systolic pressures 144 supine down to 70 with standing, significant symptoms had to lay back down.  Noted more than once, 50 point drop  Reports that his has been a chronic issue but worse recently  Other prior cardiac history reviewed Admitted 10/24-10/29/19 with acute on chronic diastolic HF. HF team consulted with concerns for low output. PICC was placed and  milrinone to support RV function was started along with IV Lasix to facilitate diuresis. RHC done to evaluate PAH (see below). V/Q scan negative. High res CT showed pulmonary fibrosis. Pulmonary consult and recommended stopping amiodarone. She was transitioned to torsemide 60 mg BID. DC weight: 226 lbs.    She has started on selexipag for pulmonary hypertension in addition to Opsumit and Revatio.       Echo in 3/21 showed EF 60-65%, normal RV, PASP 50 mmHg, moderate-severe MR.    Had [place] 08/30/2020- low/normal filling pressures, mild pumonary htn, and preserved cardiac. output.    Admitted to [facility] 1/23 with endocarditis and PNA, requiring pressors. BCx positive for Streptococcus group G.  All her PAH meds were held. TEE showed a large vegetation on the pacer wire in the right atrium above the tricuspid valve (1 x 1.5 cm).  No vegetations noted on the valves.  EF was 55% and there was note of mild to moderate mitral regurgitation. RV ok. She was transferred to [facility] for pacemaker extraction. EP felt she was not a candidate due to multiple comorbidities. ID consulted and planned for 6 weeks of IV abx. She developed Afib with RVR and started on amio. She underwent successful TEE-guided DCCV 1/22. Hospitalization complicated by anemia, hypotension, and AKI Spiro and KCL, midodrine started during admission. She was discharged to SNF, weight 198 lbs.    Past Medical History:  Diagnosis Date   Arthritis    Atrial fibrillation, persistent ([place])    a. s/p TEE-guided DCCV on 08/20/2017   Cardiorenal syndrome    a. 09/2017 Creat rose to 2.7 w/ diuresis-->HD x 2.   Chest pain    a. 01/2012 Cath: nonobs dzs;  c. 04/2014 MV: No ischemia.   Chronic back pain    Chronic combined systolic (congestive) and diastolic (congestive) heart failure ([place])    a. Dx 2005 @ [facility];  b. 02/2014 Echo: EF 55-60%; c. 07/2017: EF 30-35%, diffuse HK, trivial AI, severe MR, moderate TR; d. 09/2017 Echo: EF 40-45%; e. 02/2018 Echo: EF  55-60%, Gr1 DD; f. 08/2019 Echo: EF 60-65%, Gr2 DD, Nl RV fxn, RVSP 49.47mmHg. Mod-Sev MR. Mild to mod TR. Mod PR. RA ~ 2mmHg.   Chronic respiratory failure (HCC)    a. on home O2   Gastritis    History of intracranial hemorrhage    a. 6/14 described by neurosurgery as "small frontal posttraumatic SAH " - pt was on xarelto @ the time.   Hyperlipidemia    Hypertension    Interstitial lung disease (HCC)    Lipoma of back    Lymphedema    a. Chronic LLE edema.   NICM (nonischemic cardiomyopathy) ([place])    a. 09/2017 Echo: EF 40-45%; b. 09/2017 Cath: minor, insignificant CAD; c. 02/2018 Echo: EF 55-60%, Gr1 DD; d. 08/2019 Echo: EF 60-65%, Gr2 DD.   Obesity    PAH (pulmonary artery hypertension) ([place])    a. 09/2017 Echo: PASP 24mmHg; b. 09/2017 RHC: RA 23, RV 84/13, PA 84/29, PCWP 20, CO 3.89, CI 1.89. LVEDP 18.   Presence of permanent cardiac pacemaker 01/09/2014   Sepsis with metabolic encephalopathy ([place])    Sleep apnea    Streptococcal bacteremia    Stroke [facility]) 2014   Subarachnoid hemorrhage ([place]) 01/19/2013   Symptomatic bradycardia    a. s/p BSX PPM.   Thyroid nodule    Type II diabetes mellitus ([place])    Urinary incontinence     Past Surgical History:  Procedure Laterality Date   ABDOMINAL HYSTERECTOMY  1969   BREAST BIOPSY Right 10/28/2019   lymph node bx, [place] Bx, pending path    CARDIAC CATHETERIZATION  2013   @ [place]: No obstructive CAD: Only 20% ostial left circumflex.   CARDIOVERSION N/A 10/12/2017   Procedure: CARDIOVERSION;  Surgeon: Minna Merritts, MD;  Location: [place] [facility];  Service: Cardiovascular;  Laterality: N/A;   CARDIOVERSION N/A 07/04/2021   Procedure: CARDIOVERSION;  Surgeon: Larey Dresser, MD;  Location: [facility] ENDOSCOPY;  Service: Cardiovascular;  Laterality: N/A;   CATARACT EXTRACTION W/PHACO Right 09/19/2020   Procedure: CATARACT EXTRACTION PHACO AND INTRAOCULAR LENS PLACEMENT (IOC) RIGHT DIABETIC 3.95 00:43.0 9.2%;  Surgeon: Leandrew Koyanagi, MD;  Location:  [place];  Service: Ophthalmology;  Laterality: Right;  Pt requests to be last sleep apnea   CATARACT EXTRACTION W/PHACO Left 10/03/2020   Procedure: CATARACT EXTRACTION PHACO AND INTRAOCULAR LENS PLACEMENT ([place]) LEFT DIABETIC;  Surgeon: Leandrew Koyanagi, MD;  Location: [place];  Service: Ophthalmology;  Laterality: Left;  3.25 0:59.3 5.5%   CHOLECYSTECTOMY     COLONOSCOPY WITH PROPOFOL N/A 09/10/2018   Procedure: COLONOSCOPY WITH PROPOFOL;  Surgeon: Manya Silvas, MD;  Location: [facility] ENDOSCOPY;  Service: Endoscopy;  Laterality: N/A;   CYST REMOVAL TRUNK  2002   BACK   DIALYSIS/PERMA CATHETER INSERTION N/A 10/06/2017   Procedure: DIALYSIS/PERMA CATHETER INSERTION;  Surgeon: Katha Cabal, MD;  Location: [place] CV LAB;  Service: Cardiovascular;  Laterality: N/A;   INSERT / REPLACE / REMOVE PACEMAKER     lipoma removal  2014   back   PACEMAKER INSERTION  2015   [employer] dual chamber pacemaker implanted by Dr Caryl Comes for symptomatic bradycardia  PERMANENT PACEMAKER INSERTION N/A 01/09/2014   Procedure: PERMANENT PACEMAKER INSERTION;  Surgeon: Deboraha Sprang, MD;  Location: [facility] CATH LAB;  Service: Cardiovascular;  Laterality: N/A;   RIGHT HEART CATH N/A 04/19/2018   Procedure: RIGHT HEART CATH;  Surgeon: Larey Dresser, MD;  Location: [place] [facility] LAB;  Service: Cardiovascular;  Laterality: N/A;   RIGHT HEART CATH N/A 08/30/2020   Procedure: RIGHT HEART CATH;  Surgeon: Larey Dresser, MD;  Location: [place] [facility];  Service: Cardiovascular;  Laterality: N/A;   RIGHT/LEFT HEART CATH AND CORONARY ANGIOGRAPHY N/A 10/19/2017   Procedure: RIGHT/LEFT HEART CATH AND CORONARY ANGIOGRAPHY;  Surgeon: Wellington Hampshire, MD;  Location: [place] CV LAB;  Service: Cardiovascular;  Laterality: N/A;   TEE WITHOUT CARDIOVERSION N/A 08/20/2017   Procedure: TRANSESOPHAGEAL ECHOCARDIOGRAM (TEE) & Direct current cardioversion;  Surgeon: Minna Merritts,  MD;  Location: [facility];  Service: Cardiovascular;  Laterality: N/A;   TEE WITHOUT CARDIOVERSION N/A 06/28/2021   Procedure: TRANSESOPHAGEAL ECHOCARDIOGRAM (TEE);  Surgeon: Minna Merritts, MD;  Location: [facility];  Service: Cardiovascular;  Laterality: N/A;   TEE WITHOUT CARDIOVERSION N/A 07/04/2021   Procedure: TRANSESOPHAGEAL ECHOCARDIOGRAM (TEE);  Surgeon: Larey Dresser, MD;  Location: [facility] ENDOSCOPY;  Service: Cardiovascular;  Laterality: N/A;   TRIGGER FINGER RELEASE       Home Medications:  Prior to Admission medications   Medication Sig Start Date End Date Taking? Authorizing Provider  acetaminophen (TYLENOL) 325 MG tablet Take 325-650 mg by mouth every 6 (six) hours as needed for mild pain.   Yes [provider]  albuterol (ACCUNEB) 0.63 MG/3ML nebulizer solution Take 1 ampule by nebulization every 6 (six) hours as needed for wheezing.   Yes [provider]  amiodarone (PACERONE) 200 MG tablet Take 1 tablet (200 mg total) by mouth daily. 08/14/21  Yes Larey Dresser, MD  apixaban (ELIQUIS) 5 MG TABS tablet Take 5 mg by mouth 2 (two) times daily.   Yes [provider]  benzonatate (TESSALON) 200 MG capsule Take 1 capsule (200 mg total) by mouth 3 (three) times daily as needed for cough. 07/09/21  Yes Rai, Ripudeep K, MD  cefadroxil (DURICEF) 500 MG capsule Take 500 mg by mouth 2 (two) times daily.   Yes [provider]  Difluprednate 0.05 % EMUL Place 1 drop into both eyes 2 (two) times daily.   Yes [provider]  digoxin (LANOXIN) 0.125 MG tablet Take by mouth daily.   Yes [provider]  DULoxetine (CYMBALTA) 60 MG capsule Take 60 mg by mouth daily.   Yes [provider]  gabapentin (NEURONTIN) 300 MG capsule Take 600 mg by mouth 3 (three) times daily.   Yes [provider]  macitentan (OPSUMIT) 10 MG tablet Take 1 tablet (10 mg total) by mouth daily. 07/26/21  Yes Larey Dresser, MD  meclizine (ANTIVERT) 12.5  MG tablet Take 12.5 mg by mouth 2 (two) times daily.   Yes [provider]  meclizine (ANTIVERT) 25 MG tablet Take 1 tablet (25 mg total) by mouth 3 (three) times daily as needed for dizziness. 12/25/20  Yes Larey Dresser, MD  Menthol, Topical Analgesic, (BIOFREEZE EX) Apply 1 application topically 4 (four) times daily as needed (for pain).   Yes [provider]  midodrine (PROAMATINE) 5 MG tablet Take 1 tablet (5 mg total) by mouth 3 (three) times daily with meals. 08/14/21  Yes Larey Dresser, MD  potassium chloride (KLOR-CON) 10 MEQ  tablet Take 2 tablets (20 mEq total) by mouth daily. Patient taking differently: Take 40 mEq by mouth in the morning, at noon, and at bedtime. 07/22/21  Yes [place], Maricela Bo, FNP  potassium chloride SA (KLOR-CON M) 20 MEQ tablet Take 20 mEq by mouth once.   Yes [provider]  rosuvastatin (CRESTOR) 10 MG tablet Take 1 tablet by mouth once daily 06/06/21  Yes Larey Dresser, MD  Selexipag (UPTRAVI) 1600 MCG TABS Take 1 tablet (1,600 mcg total) by mouth 2 (two) times daily. 07/30/21  Yes Larey Dresser, MD  sildenafil (REVATIO) 20 MG tablet Take 20 mg by mouth 3 (three) times daily.   Yes [provider]  spironolactone (ALDACTONE) 25 MG tablet Take 12.5 mg by mouth daily.   Yes [provider]  torsemide (DEMADEX) 20 MG tablet Take 60 mg by mouth 2 (two) times daily. Take 3 tablets by mouth twice daily   Yes [provider]  cholestyramine light (PREVALITE) 4 g packet Take 1 packet (4 g total) by mouth daily as needed (diarrhea). Patient not taking: Reported on 08/21/2021 07/09/21   Rai, Vernelle Emerald, MD  DULoxetine (CYMBALTA) 30 MG capsule TAKE 1 CAPSULE BY MOUTH ONCE DAILY FOR 14 DAYS, THEN INCREASE TO 2 CAPSULES ONCE DAILY Patient not taking: Reported on 08/21/2021 06/06/21   [name], MD  Magnesium 200 MG TABS Take 1 tablet by mouth in the morning and at bedtime. Patient not taking: Reported on  08/21/2021    [provider]  pantoprazole (PROTONIX) 40 MG tablet Take 40 mg by mouth daily. Patient not taking: Reported on 08/21/2021    [provider]  Torsemide 40 MG TABS Take 60 mg by mouth 2 (two) times daily. Patient not taking: Reported on 08/21/2021 07/22/21   Rafael Bihari, FNP    Inpatient Medications: Scheduled Meds:  amiodarone  200 mg Oral Daily   apixaban  5 mg Oral BID   Difluprednate  1 drop Ophthalmic BID   digoxin  0.125 mg Oral Daily   DULoxetine  60 mg Oral Daily   fluticasone  2 spray Each Nare Daily   gabapentin  600 mg Oral TID   macitentan  10 mg Oral Daily   midodrine  5 mg Oral TID WC   rosuvastatin  10 mg Oral Daily   Selexipag  1,600 mcg Oral BID   sildenafil  20 mg Oral TID   sodium chloride flush  3 mL Intravenous Q12H   Continuous Infusions:  sodium chloride     cefTRIAXone (ROCEPHIN)  IV Stopped (08/21/21 2152)   PRN Meds: sodium chloride, acetaminophen **OR** acetaminophen, albuterol, meclizine, ondansetron **OR** ondansetron (ZOFRAN) IV, sodium chloride flush  Allergies:    Allergies  Allergen Reactions   Aspirin Anaphylaxis and Hives    Pt states that she is unable to take because she had bleeding in her brain   Other Other (See Comments)    Pt states that she is unable to take blood thinners because she had bleeding in her brain; Blood Thinners-per doctor at [facility] (Xarelto)    Social History:   Social History   Socioeconomic History   Marital status: Divorced    Spouse name: Not on file   Number of children: 4   Years of education: 12   Highest education level: High school graduate  Occupational History   Occupation: Retired- factory work    Comment: exposed to Pharmacist, community  Tobacco Use   Smoking  status: Former    Packs/day: 0.30    Years: 2.00    Pack years: 0.60    Types: Cigarettes    Quit date: 06/23/1990    Years since quitting: 31.1   Smokeless tobacco: Never  Vaping Use   Vaping Use: Never  used  Substance and Sexual Activity   Alcohol use: No   Drug use: No   Sexual activity: Not Currently  Other Topics Concern   Not on file  Social History Narrative   Lives in [place] with daughter. Has 4 children. No pets.      Work - Restaurant manager, fast food, retired      Diet - regular      02/14/2019: reports has Meals On Wheels delivered to her home;       Remote hx of smoking > 30 years; No alcohol-    Social Determinants of Health   Financial Resource Strain: Medium Risk   Difficulty of Paying Living Expenses: Somewhat hard  Food Insecurity: No Food Insecurity   Worried About Charity fundraiser in the Last Year: Never true   Ran Out of Food in the Last Year: Never true  Transportation Needs: No Transportation Needs   Lack of Transportation (Medical): No   Lack of Transportation (Non-Medical): No  Physical Activity: Not on file  Stress: No Stress Concern Present   Feeling of Stress : Only a little  Social Connections: Socially Isolated   Frequency of Communication with Friends and Family: More than three times a week   Frequency of Social Gatherings with Friends and Family: Once a week   Attends Religious Services: Never   Marine scientist or Organizations: No   Attends Music therapist: Never   Marital Status: Divorced  Human resources officer Violence: Not At Risk   Fear of Current or Ex-Partner: No   Emotionally Abused: No   Physically Abused: No   Sexually Abused: No    Family History:    Family History  Problem Relation Age of Onset   Heart disease Mother    Hypertension Mother    COPD Mother        was a smoker   Thyroid disease Mother    Hypertension Father    Hypertension Sister    Thyroid disease Sister    Breast cancer Sister    Hypertension Sister    Thyroid disease Sister    Bone cancer Sister    Breast cancer Maternal Aunt    Kidney disease Neg Hx    Bladder Cancer Neg Hx      ROS:  Please see the history of present illness.   Review of Systems  Constitutional: Negative.   HENT: Negative.    Respiratory: Negative.    Cardiovascular: Negative.   Gastrointestinal: Negative.   Musculoskeletal: Negative.   Neurological:  Positive for dizziness.       Near syncope  Psychiatric/Behavioral: Negative.    All other systems reviewed and are negative.   Physical Exam/Data:   Vitals:   08/22/21 0416 08/22/21 0745 08/22/21 1118 08/22/21 1623  BP: 126/61 (!) 116/50 (!) 106/50 (!) 117/49  Pulse: 60 60 62 60  Resp: 14 18 17 18   Temp: 97.8 F (36.6 C) 99.2 F (37.3 C) 98.6 F (37 C) 98.8 F (37.1 C)  TempSrc:  Oral Oral Oral  SpO2: 99% 100% 98% 99%  Weight:      Height:        Intake/Output Summary (Last 24 hours) at 08/22/2021  1906 Last data filed at 08/22/2021 1813 Gross per 24 hour  Intake 820 ml  Output 2800 ml  Net -1980 ml   Last 3 Weights 08/21/2021 08/21/2021 07/31/2021  Weight (lbs) 181 lb 3.5 oz 182 lb 180 lb 3.2 oz  Weight (kg) 82.2 kg 82.555 kg 81.738 kg     Body mass index is 35.39 kg/m.  General:  Well nourished, well developed, in no acute distress obese HEENT: normal Neck:  JVD 8 Vascular: No carotid bruits; Distal pulses 2+ bilaterally Cardiac:  normal S1, S2; RRR; no murmur  Lungs: Scattered Rales Abd: soft, nontender, no hepatomegaly  Ext: no edema Musculoskeletal:  No deformities, BUE and BLE strength normal and equal Skin: warm and dry  Neuro:  CNs 2-12 intact, no focal abnormalities noted Psych:  Normal affect   EKG:  The EKG was personally reviewed and demonstrates:   Normal sinus rhythm rate 60 bpm diffuse T wave abnormality V2 through V6 2 3 aVF Prior EKG showing paced rhythm, wide complex  Telemetry:  Telemetry was personally reviewed and demonstrates:   Normal sinus rhythm  Relevant CV Studies: TEE July 04, 2021   1. Left ventricular ejection fraction, by estimation, is 60 to 65%. The  left ventricle has normal function. There is mild left ventricular  hypertrophy.    2. Peak RV-RA gradient 30 mmHg. There is a 1.6 x 1 cm mobile vegetation  attached to the pacemaker in the RA. Right ventricular systolic function  is normal. The right ventricular size is mildly enlarged.   3. Left atrial size was mildly dilated. No left atrial/left atrial  appendage thrombus was detected.   4. Right atrial size was moderately dilated.   5. The mitral valve is normal in structure. Mild to moderate mitral valve  regurgitation. No evidence of mitral stenosis.   6. There is mobile vegetation on the tricuspid valve. The tricuspid valve  is abnormal. Tricuspid valve regurgitation is moderate.   7. The aortic valve is tricuspid. Aortic valve regurgitation is not  visualized. Aortic valve sclerosis/calcification is present, without any  evidence of aortic stenosis.   8. Grade 3 plaque in the descending thoracic aorta.   9. No PFO or ASD by color doppler.   Laboratory Data:  High Sensitivity Troponin:   Recent Labs  Lab 08/21/21 1337 08/21/21 1622  TROPONINIHS 15 14     Chemistry Recent Labs  Lab 08/21/21 1337 08/22/21 0619  NA 136 137  K 4.8 4.6  CL 97* 100  CO2 27 29  GLUCOSE 211* 126*  BUN 28* 30*  CREATININE 1.54* 1.43*  CALCIUM 9.3 9.2  GFRNONAA 34* 38*  ANIONGAP 12 8    No results for input(s): PROT, ALBUMIN, AST, ALT, ALKPHOS, BILITOT in the last 168 hours. Lipids No results for input(s): CHOL, TRIG, HDL, LABVLDL, LDLCALC, CHOLHDL in the last 168 hours.  Hematology Recent Labs  Lab 08/21/21 1337 08/22/21 0619  WBC 7.6 6.1  RBC 3.68* 3.28*  HGB 9.6* 8.5*  HCT 32.1* 28.2*  MCV 87.2 86.0  MCH 26.1 25.9*  MCHC 29.9* 30.1  RDW 14.3 14.4  PLT 244 203   Thyroid No results for input(s): TSH, FREET4 in the last 168 hours.  BNP Recent Labs  Lab 08/21/21 1444  BNP 45.7    DDimer No results for input(s): DDIMER in the last 168 hours.   Radiology/Studies:  DG Chest 2 View  Result Date: 08/21/2021 CLINICAL DATA:  Fatigue, recent endocarditis,  dizziness for few weeks,  nausea since Saturday EXAM: CHEST - 2 VIEW COMPARISON:  07/03/2021 FINDINGS: LEFT subclavian sequential transvenous pacemaker leads project at RIGHT atrium and RIGHT ventricle. Enlargement of cardiac silhouette with pulmonary vascular congestion. Atherosclerotic calcification aorta. Scattered interstitial infiltrates in both lungs favoring pulmonary edema. No pleural effusion or pneumothorax. Mild persistent elevation of RIGHT diaphragm. No acute osseous findings. IMPRESSION: Enlargement of cardiac silhouette with pulmonary vascular congestion and persistent interstitial infiltrates favoring pulmonary edema. Aortic Atherosclerosis (ICD10-I70.0). Electronically Signed   By: Lavonia Dana M.D.   On: 08/21/2021 15:07   CT Head Wo Contrast  Result Date: 08/21/2021 CLINICAL DATA:  Dizziness EXAM: CT HEAD WITHOUT CONTRAST TECHNIQUE: Contiguous axial images were obtained from the base of the skull through the vertex without intravenous contrast. RADIATION DOSE REDUCTION: This exam was performed according to the departmental dose-optimization program which includes automated exposure control, adjustment of the mA and/or kV according to patient size and/or use of iterative reconstruction technique. COMPARISON:  CT head 03/30/2015 FINDINGS: Brain: No acute intracranial hemorrhage, mass effect, or herniation. No extra-axial fluid collections. No evidence of acute territorial infarct. No hydrocephalus. Mild patchy hypodensities in the periventricular and subcortical white matter appears stable and likely represent chronic microvascular ischemic changes. Vascular: Calcified plaques in the carotid siphons. Skull: Normal. Negative for fracture or focal lesion. Sinuses/Orbits: Air-fluid level seen throughout all of the paranasal sinuses as well as moderate to severe opacification of the ethmoid air cells. Extensive opacification also seen in the bilateral mastoid air cells. Other: None. IMPRESSION: 1. No  acute intracranial process identified. Chronic microvascular ischemic changes. 2. Evidence of acute pansinusitis as well as extensive opacification in the bilateral mastoid air cells, correlate clinically. Electronically Signed   By: Ofilia Neas M.D.   On: 08/21/2021 14:44   CT CHEST WO CONTRAST  Result Date: 08/21/2021 CLINICAL DATA:  Dizziness. EXAM: CT CHEST WITHOUT CONTRAST TECHNIQUE: Multidetector CT imaging of the chest was performed following the standard protocol without IV contrast. RADIATION DOSE REDUCTION: This exam was performed according to the departmental dose-optimization program which includes automated exposure control, adjustment of the mA and/or kV according to patient size and/or use of iterative reconstruction technique. COMPARISON:  Radiograph of same day.  CT of October 15, 2020. FINDINGS: Cardiovascular: Atherosclerosis of thoracic aorta is noted without aneurysm formation. Mild cardiomegaly is noted. No pericardial effusion is noted. Mediastinum/Nodes: Stable enlarged thyroid gland with multiple small nodules. Stable calcified subcarinal adenopathy is noted consistent with prior granulomatous disease. Esophagus is unremarkable. Lungs/Pleura: No pneumothorax or pleural effusion is noted. Stable reticular opacities and bronchiectasis is seen in the upper and lower lobes bilaterally suggesting fibrosis or possible chronic pneumonitis. Interval development of 2.1 x 1.6 cm irregular density seen in the right lower lobe best seen on image 80 of series 3. This may represent focal inflammation or atelectasis, but neoplasm cannot be excluded. Evaluation is limited due to the lack of intravenous contrast. Upper Abdomen: No acute abnormality. Musculoskeletal: No chest wall mass or suspicious bone lesions identified. IMPRESSION: Stable large thyroid gland with multiple nodules. Not clinically significant; no follow-up imaging recommended. (Ref: J Am Coll Radiol. 2015 Feb;12(2): 143-50). There is  again noted stable reticular opacities and bronchiectasis seen involving the upper and lower lobes bilaterally suggesting fibrosis or possibly chronic pneumonitis. There is noted interval development of 2.1 x 1.6 cm irregular nodular density in the right lower lobe; this may represent focal inflammation or atelectasis, but neoplasm or malignancy cannot be excluded. Follow-up CT scan of the chest  with intravenous contrast in 2-3 weeks is recommended to ensure stability or resolution. Aortic Atherosclerosis (ICD10-I70.0). Electronically Signed   By: Marijo Conception M.D.   On: 08/21/2021 17:15     Assessment and Plan:   Symptomatic orthostasis/near syncope 50 point drop in pressure today when trying to work with PT, verified more than once -Certainly possible in the setting of COVID 2 weeks ago she has had weight loss, poor fluid intake Renal function appears above her baseline creatinine on arrival 1.54, trending down to 1.43, BUN remains elevated double her typical baseline --Agree with holding torsemide/Aldactone for now Recheck orthostatics tomorrow.  Avoid IV fluids, would just let blood pressure trend up slowly --Continue midodrine 5 mg 3 times daily.  For persistent orthostasis when she reaches euvolemic state may need to go to 10  2.  Pulmonary hypertension She has brought in her Opsumit, [name] These will be given to pharmacy for continued dosing Continue sildenafil, digoxin Digoxin level very low.  We will very try that she is taking this at home We will only restart torsemide once renal function and blood pressure stabilizes  3.  COVID infection 2 weeks ago, still with significant nasal congestion, difficulty hearing May need Flonase nasal spray, saline washes Avoid Sudafed type medication  4.  Atrial fibrillation, paroxysmal Continue amiodarone, digoxin, Eliquis Recent TEE cardioversion 2 months ago, normal sinus rhythm restored She is maintaining normal sinus  rhythm  Interstitial lung disease Has chronic respiratory failure Unfortunately few options for her atrial fibrillation, will continue amiodarone  Long discussion with her concerning her symptoms, management, case discussed with hospitalist service  Total encounter time more than 110 minutes  Greater than 50% was spent in counseling and coordination of care with the patient  For questions or updates, please contact [place] Please consult [email or web address] for contact info under    Signed, Ida Rogue, MD  08/22/2021 7:06 PM

## 2021-08-22 NOTE — Assessment & Plan Note (Addendum)
--  Per PT eval on 3/2, BP dropped to 60's-70's when standing.  Pt is very symptomatic with severe dizziness and near syncope with standing. ?--Pt said this is a known problem, and takes midodrine to increase BP ?--Hold torsemide and aldactone held since 3/2, midodrine increased on 3/3. ?--cardiology consulted ?Plan: ?--Hold home torsemide and aldactone for now ?--cont midodrine at increased 10 mg TID ?--cont TED hose and abdominal binder ?--frequent mobility  ? ?

## 2021-08-22 NOTE — Evaluation (Addendum)
Physical Therapy Evaluation ?Patient Details ?Name: Debra Saunders ?MRN: [id number] ?DOB: [birth year] ?Today's Date: 08/22/2021 ? ?History of Present Illness ? The patient is a 76 yo female that presented to the ED for chief complaint of dizziness, gait instability. Recently admitted for endocarditis, pacemaker site/wire infection, transferred to [facility]. Discharged to subacute rehab for IV antibiotic therapy and returned home about a week prior to this admission. Workup positive for SARS coronavirus 2, as well as acute pansinusitis as well as extensive opacification in the bilateral mastoid air cells. PMH includes HTN, afib, pacemaker, PAH, chronic resp failure (on home O2), CHF, cardiorenal syndrome, DM2, OSA, stroke, ICH, chronic back pain, arthritis. ?  ?Clinical Impression ? Pt awake and resting in bed upon PT entrance into room today for evaluation. Pt is A&Ox4 and denies any c/o pain at rest. She does report she is still feeling dizzy at rest. Pt states she lives at home alone, w/ a ramp to enter. She uses a Rollator at baseline and requires assistance from her daughter and grandson PRN.  ? ?Pt was able to perform bed mobility w/ SUPERVISION. Once seated EOB, Pt still had c/o dizziness but was able to progress to standing w/ CGA and RW to continue to check orthostatics. BP dropped w/ first standing attempt (see "Vitals" section) and positive orthostatics. Pt laid back down and BP returned to [facility], and attempt to standing was attempted once again, but BP dropped again and positive orthostatics once again. Further mobility/transfers were differed due to positive orthostatics, RN and MD notified about (+) orthostatics. Pt will benefit from continued skilled PT in order to increase LE strength, improve mobility/gait/endurance, and restore PLOF. Current discharge recommendation is HHPT, this is appropriate due to the level of assistance required by the patient to ensure safety and improve overall function. ? ?    ? ?Recommendations for follow up therapy are one component of a multi-disciplinary discharge planning process, led by the attending physician.  Recommendations may be updated based on patient status, additional functional criteria and insurance authorization. ? ?Follow Up Recommendations Home health PT ? ?  ?Assistance Recommended at Discharge Frequent or constant Supervision/Assistance  ?Patient can return home with the following ? A little help with walking and/or transfers;A little help with bathing/dressing/bathroom;Assistance with cooking/housework;Assist for transportation;Help with stairs or ramp for entrance ? ?  ?Equipment Recommendations Rolling walker (2 wheels)  ?Recommendations for Other Services ?    ?  ?Functional Status Assessment Patient has had a recent decline in their functional status and demonstrates the ability to make significant improvements in function in a reasonable and predictable amount of time.  ? ?  ?Precautions / Restrictions Precautions ?Precautions: Fall ?Restrictions ?Weight Bearing Restrictions: No  ? ?  ? ?Mobility ? Bed Mobility ?Overal bed mobility: Needs Assistance ?Bed Mobility: Supine to Sit, Sit to Supine ?  ?  ?Supine to sit: Supervision ?Sit to supine: Supervision ?  ?  ?  ? ?Transfers ?Overall transfer level: Needs assistance ?Equipment used: Rolling walker (2 wheels) ?Transfers: Sit to/from Stand ?Sit to Stand: Min guard ?  ?  ?  ?  ?  ?  ?  ? ?Ambulation/Gait ?  ?  ?  ?  ?  ?  ?  ?  ? ?Stairs ?  ?  ?  ?  ?  ? ?Wheelchair Mobility ?  ? ?Modified Rankin (Stroke Patients Only) ?  ? ?  ? ?Balance Overall balance assessment: Needs assistance ?Sitting-balance support: Bilateral upper extremity  supported, Feet supported ?Sitting balance-Leahy Scale: Good ?  ?  ?Standing balance support: Bilateral upper extremity supported, During functional activity, Reliant on assistive device for balance ?Standing balance-Leahy Scale: Fair ?  ?  ?  ?  ?  ?  ?  ?  ?  ?  ?  ?  ?    ? ? ? ?Pertinent Vitals/Pain Pain Assessment ?Pain Assessment: No/denies pain  ? ? ?Home Living Family/patient expects to be discharged to:: Private residence ?Living Arrangements: Alone ?Available Help at Discharge: Family;Available PRN/intermittently ?Type of Home: House ?Home Access: Ramped entrance ?  ?  ?  ?Home Layout: One level ?Home Equipment: Rollator (4 wheels);Tub bench;BSC/3in1 ?Additional Comments: Daughter and grandson come and assist PRN  ?  ?Prior Function   ?  ?  ?  ?  ?  ?  ?Mobility Comments: Typically mod indep with rollator. Wears 4L O2 baseline ?  ?  ? ? ?Hand Dominance  ?   ? ?  ?Extremity/Trunk Assessment  ? Upper Extremity Assessment ?Upper Extremity Assessment: Overall WFL for tasks assessed;Generalized weakness ?  ? ?Lower Extremity Assessment ?Lower Extremity Assessment: Overall WFL for tasks assessed;Generalized weakness ?  ? ?   ?Communication  ?    ?Cognition Arousal/Alertness: Awake/alert ?Behavior During Therapy: [facility] for tasks assessed/performed ?Overall Cognitive Status: Within Functional Limits for tasks assessed ?  ?  ?  ?  ?  ?  ?  ?  ?  ?  ?  ?  ?  ?  ?  ?  ?  ?  ?  ? ?  ?General Comments   ? ?  ?Exercises    ? ?Assessment/Plan  ?  ?PT Assessment Patient needs continued PT services  ?PT Problem List Decreased strength;Decreased mobility;Decreased range of motion;Decreased activity tolerance;Decreased balance;Decreased coordination ? ?   ?  ?PT Treatment Interventions DME instruction;Therapeutic exercise;Gait training;Balance training;Stair training;Neuromuscular re-education;Functional mobility training;Therapeutic activities   ? ?PT Goals (Current goals can be found in the Care Plan section)  ?Acute Rehab PT Goals ?Patient Stated Goal: to get dizziness resolved and get stronger to go home ?PT Goal Formulation: With patient ?Time For Goal Achievement: 09/05/21 ?Potential to Achieve Goals: Fair ? ?  ?Frequency Min 2X/week ?  ? ? ?Co-evaluation   ?  ?  ?  ?  ? ? ?  ?AM-PAC PT "6  Clicks" Mobility  ?Outcome Measure Help needed turning from your back to your side while in a flat bed without using bedrails?: A Little ?Help needed moving from lying on your back to sitting on the side of a flat bed without using bedrails?: A Little ?Help needed moving to and from a bed to a chair (including a wheelchair)?: A Little ?Help needed standing up from a chair using your arms (e.g., wheelchair or bedside chair)?: A Little ?Help needed to walk in hospital room?: A Little ?Help needed climbing 3-5 steps with a railing? : A Lot ?6 Click Score: 17 ? ?  ?End of Session   ?Activity Tolerance: Patient tolerated treatment well (Patient limited by (+) orthostatics) ?Patient left: in bed;with call bell/phone within reach;with bed alarm set ?Nurse Communication: Mobility status ?PT Visit Diagnosis: Unsteadiness on feet (R26.81);Muscle weakness (generalized) (M62.81) ?  ? ?Time: 9562-1308 ?PT Time Calculation (min) (ACUTE ONLY): 38 min ? ? ?Charges:     ?  ?  ?   ? ?Jonnie Kind, SPT ?08/22/2021, 12:39 PM ? ?

## 2021-08-22 NOTE — Progress Notes (Signed)
?Progress Note ? ? ?Patient: Debra Saunders SWF:[id number] DOB: 02-28-1946 DOA: 08/21/2021     1 ?DOS: the patient was seen and examined on 08/22/2021 ?  ?Brief hospital course: ?Debra Saunders is a 76 y.o. female with medical history significant for recent endocarditis, pacemaker site/wire infection discharged to subacute rehab for IV antibiotic therapy and returned home about a week prior to admission, history of chronic respiratory failure on 4 L of oxygen continuous history of chronic diastolic dysfunction CHF, history of A-fib status post DCCV, history of interstitial lung disease, pulmonary hypertension who was brought into the ER by EMS for evaluation of dizziness which she states that she has chronically but this episode is different.  She states that over the last several days she gets dizzy with any form of movement and her gait is very unsteady despite the use of an assist device.  She is having to hold onto her daughter to prevent her from falling.  She complains of a lot of postnasal drip as well as noises in her ears.  She complains of feeling very weak and tired. ? ?Assessment and Plan: ?* Orthostatic hypotension ?--Per PT eval, BP dropped to 60's-70's when standing ?--Pt said this is a known problem, and takes midodrine to increase BP ?Plan: ?--Hold home torsemide and aldactone for now ?--cont home midodrine ?--cardiology consult today ? ?Endocarditis ?Patient recently treated for endocarditis/infection due to implanted cardiac device. ?Recently completed IV Cefazolin through 08/08/21 and currently of Cefadroxil 500 mg BID. ?Patient is currently on IV Rocephin for her pansinusitis and will need to be restarted on her Cefadroxil upon discharge. ? ?History of COVID-19 ?Patient had COVID-19 viral infection in January while in subacute rehab ?Her COVID-19 PCR test is positive during this hospitalization ?COVID-19 cycle threshold is 38 and patient is no longer considered infectious ?No indication for  isolation at this time ? ?Generalized weakness ?Secondary to chronic illness and acutely worsened by dizziness and inability to get out of bed ?Place patient on fall precautions ?PT evaluation ? ?Chronic diastolic CHF (congestive heart failure) ([place]) ?Patient has a history of chronic diastolic dysfunction CHF ?--takes torsemide 60 mg twice daily as well as spironolactone at home ?--hold both for now ? ?Obesity (BMI 30-39.9) ?Complicates overall prognosis and care ?Lifestyle modification and exercise has been discussed with patient in detail ? ?Pansinusitis ?Patient presents for evaluation of vertigo and is found to have pansinusitis on CT head ?--started on ceftriaxone  ? ?Atrial fibrillation with tachy brady syndrome  ?History of paroxysmal A-fib ?Continue amiodarone and digoxin for rate control ?Continue Eliquis as primary prophylaxis for an acute stroke ? ?Interstitial lung disease ([place]) ?Chronic ?Associated with pulmonary hypertension ?Continue sildenafil,selexipag and macitentan ? ?Chronic respiratory failure ([place]) ?Patient has a history of chronic respiratory failure secondary to interstitial lung disease ?Continue oxygen supplementation at 3 L to maintain pulse oximetry greater than 92% ? ? ? ? ? ? ? ?Subjective:  ?Pt reported dizziness whenever she gets up.   ? ? ?Physical Exam: ?Vitals:  ? 08/22/21 0745 08/22/21 1118 08/22/21 1623 08/22/21 1949  ?BP: (!) 116/50 (!) 106/50 (!) 117/49 (!) 123/52  ?Pulse: 60 62 60 (!) 58  ?Resp: 18 17 18 20   ?Temp: 99.2 ?F (37.3 ?C) 98.6 ?F (37 ?C) 98.8 ?F (37.1 ?C) 99 ?F (37.2 ?C)  ?TempSrc: Oral Oral Oral Oral  ?SpO2: 100% 98% 99% 99%  ?Weight:      ?Height:      ? ? ?Constitutional: NAD, AAOx3 ?HEENT:  conjunctivae and lids normal, EOMI ?CV: No cyanosis.   ?RESP: normal respiratory effort, on 4L ?Extremities: No effusions, edema in BLE ?SKIN: warm, dry ?Neuro: II - XII grossly intact.   ?Psych: Normal mood and affect.  Appropriate judgement and reason ? ? ?Data  Reviewed: ? ?Family Communication:  ? ?Disposition: ?Status is: Inpatient ?Remains inpatient appropriate because: severely orthostatic ? ? Planned Discharge Destination: pending PT eval ? ? ? ? ?Time spent: 50 minutes ? ?Author: ?Enzo Bi, MD ?08/22/2021 8:38 PM ? ?For on call review [email or web address].  ? ?

## 2021-08-22 NOTE — Assessment & Plan Note (Addendum)
Patient recently treated for endocarditis/infection due to implanted cardiac device. ?Recently completed IV Cefazolin through 08/08/21 and currently of Cefadroxil 500 mg BID. ?Patient was put on IV Rocephin for her pansinusitis ?--d/c ceftriaxone today ?--resume home Cefadroxil  ?

## 2021-08-23 ENCOUNTER — Ambulatory Visit: Payer: Medicare HMO | Admitting: Family

## 2021-08-23 ENCOUNTER — Inpatient Hospital Stay (HOSPITAL_COMMUNITY)
Admit: 2021-08-23 | Discharge: 2021-08-23 | Disposition: A | Discharge status: Home / Self Care | Payer: Medicare HMO | Attending: Cardiovascular Disease | Admitting: Cardiovascular Disease

## 2021-08-23 DIAGNOSIS — U071 COVID-19: Secondary | ICD-10-CM | POA: Diagnosis not present

## 2021-08-23 DIAGNOSIS — I480 Paroxysmal atrial fibrillation: Secondary | ICD-10-CM | POA: Diagnosis not present

## 2021-08-23 DIAGNOSIS — I951 Orthostatic hypotension: Secondary | ICD-10-CM | POA: Diagnosis not present

## 2021-08-23 DIAGNOSIS — N189 Chronic kidney disease, unspecified: Secondary | ICD-10-CM | POA: Diagnosis present

## 2021-08-23 DIAGNOSIS — I38 Endocarditis, valve unspecified: Secondary | ICD-10-CM | POA: Diagnosis not present

## 2021-08-23 DIAGNOSIS — N1832 Chronic kidney disease, stage 3b: Secondary | ICD-10-CM | POA: Diagnosis present

## 2021-08-23 DIAGNOSIS — D631 Anemia in chronic kidney disease: Secondary | ICD-10-CM | POA: Diagnosis present

## 2021-08-23 DIAGNOSIS — I272 Pulmonary hypertension, unspecified: Secondary | ICD-10-CM | POA: Diagnosis not present

## 2021-08-23 LAB — GLUCOSE, CAPILLARY
Glucose-Capillary: 123 mg/dL — ABNORMAL HIGH (ref 70–99)
Glucose-Capillary: 204 mg/dL — ABNORMAL HIGH (ref 70–99)
Glucose-Capillary: 127 mg/dL — ABNORMAL HIGH (ref 70–99)
Glucose-Capillary: 158 mg/dL — ABNORMAL HIGH (ref 70–99)

## 2021-08-23 LAB — ECHOCARDIOGRAM COMPLETE
AR max vel: 2.46 cm2
AV Area VTI: 2.8 cm2
AV Area mean vel: 2.32 cm2
AV Mean grad: 5 mmHg
AV Peak grad: 8.2 mmHg
Ao pk vel: 1.43 m/s
Area-P 1/2: 1.82 cm2
Height: 60 in
MV VTI: 1.98 cm2
S' Lateral: 2.2 cm
Weight: 2899.49 oz

## 2021-08-23 LAB — BASIC METABOLIC PANEL
Anion gap: 10 (ref 5–15)
BUN: 34 mg/dL — ABNORMAL HIGH (ref 8–23)
CO2: 29 mmol/L (ref 22–32)
Calcium: 8.9 mg/dL (ref 8.9–10.3)
Chloride: 95 mmol/L — ABNORMAL LOW (ref 98–111)
Creatinine, Ser: 1.47 mg/dL — ABNORMAL HIGH (ref 0.44–1.00)
GFR, Estimated: 36 mL/min — ABNORMAL LOW (ref >60)
Glucose, Bld: 148 mg/dL — ABNORMAL HIGH (ref 70–99)
Potassium: 4.4 mmol/L (ref 3.5–5.1)
Sodium: 134 mmol/L — ABNORMAL LOW (ref 135–145)

## 2021-08-23 LAB — MAGNESIUM: Magnesium: 1.8 mg/dL (ref 1.7–2.4)

## 2021-08-23 LAB — CBC
HCT: 26.4 % — ABNORMAL LOW (ref 36.0–46.0)
Hemoglobin: 7.9 g/dL — ABNORMAL LOW (ref 12.0–15.0)
MCH: 25.6 pg — ABNORMAL LOW (ref 26.0–34.0)
MCHC: 29.9 g/dL — ABNORMAL LOW (ref 30.0–36.0)
MCV: 85.4 fL (ref 80.0–100.0)
Platelets: 210 10*3/uL (ref 150–400)
RBC: 3.09 MIL/uL — ABNORMAL LOW (ref 3.87–5.11)
RDW: 14.3 % (ref 11.5–15.5)
WBC: 6.7 10*3/uL (ref 4.0–10.5)
nRBC: 0 % (ref 0.0–0.2)

## 2021-08-23 LAB — PREPARE RBC (CROSSMATCH)

## 2021-08-23 MED ORDER — MIDODRINE HCL 5 MG PO TABS: 10.0000 mg | ORAL_TABLET | Freq: Three times a day (TID) | ORAL | Status: DC

## 2021-08-23 MED ORDER — SODIUM CHLORIDE 0.9% IV SOLUTION: Freq: Once | INTRAVENOUS | Status: DC

## 2021-08-23 MED ORDER — MIDODRINE HCL 5 MG PO TABS
10.0000 mg | ORAL_TABLET | Freq: Three times a day (TID) | ORAL | Status: DC
Start: 2021-08-23 — End: 2021-08-27
  Administered 2021-08-23 – 2021-08-27 (×12): 10 mg via ORAL
  Filled 2021-08-23 (×14): qty 2

## 2021-08-23 NOTE — Care Management Important Message (Signed)
Important Message ? ?Patient Details  ?Name: Debra Saunders ?MRN: [id number] ?Date of Birth: 1945/09/30 ? ? ?Medicare Important Message Given:  N/A - LOS <3 / Initial given by admissions ? ? ? ? ?[name] ?08/23/2021, 9:56 AM ?

## 2021-08-23 NOTE — Progress Notes (Signed)
Patient reported she had not urinated today.  Noted 275 ml in suction container, however patient said that was from last night.   ?Nurse tech reported she had to clean patient up first thing this morning (large occurrence), however patient stated that was also from last night and was just then getting cleaned up. ?Patient is Alert and oriented x4.   ? ?Bladder scan was performed- The max amount noted was around 215 ml ? ?MD was notified- Ordered an In/out cath  ?

## 2021-08-23 NOTE — Progress Notes (Signed)
Progress Note  Patient Name: Debra Saunders Date of Encounter: 08/23/2021  [facility] Cardiologist: [name], MD   Subjective   Still dizzy, even while sitting. Unable to check standing BP standing. BP still soft at times.   Inpatient Medications    Scheduled Meds:  amiodarone  200 mg Oral Daily   apixaban  5 mg Oral BID   Difluprednate  1 drop Ophthalmic BID   digoxin  0.125 mg Oral Daily   DULoxetine  60 mg Oral Daily   fluticasone  2 spray Each Nare Daily   gabapentin  600 mg Oral TID   macitentan  10 mg Oral Daily   midodrine  5 mg Oral TID WC   rosuvastatin  10 mg Oral Daily   Selexipag  1,600 mcg Oral BID   sildenafil  20 mg Oral TID   sodium chloride flush  3 mL Intravenous Q12H   Continuous Infusions:  sodium chloride     cefTRIAXone (ROCEPHIN)  IV 2 g (08/22/21 2020)   PRN Meds: sodium chloride, acetaminophen **OR** acetaminophen, albuterol, meclizine, ondansetron **OR** ondansetron (ZOFRAN) IV, sodium chloride flush   Vital Signs    Vitals:   08/22/21 2335 08/23/21 0015 08/23/21 0309 08/23/21 0802  BP:  (!) 115/48 (!) 113/49 (!) 105/54  Pulse: 65 69 68 60  Resp: (!) 22 20 15 18   Temp:  98.5 F (36.9 C) 98.2 F (36.8 C) 98.4 F (36.9 C)  TempSrc:   Axillary   SpO2: 98% 96% 96% 100%  Weight:      Height:        Intake/Output Summary (Last 24 hours) at 08/23/2021 1225 Last data filed at 08/23/2021 1000 Gross per 24 hour  Intake 720 ml  Output 1550 ml  Net -830 ml   Last 3 Weights 08/21/2021 08/21/2021 07/31/2021  Weight (lbs) 181 lb 3.5 oz 182 lb 180 lb 3.2 oz  Weight (kg) 82.2 kg 82.555 kg 81.738 kg      Telemetry    AV pacing, PVCs, HR 60s - Personally Reviewed  ECG    No new - Personally Reviewed  Physical Exam   GEN: No acute distress.   Neck: No JVD Cardiac: RRR, no murmurs, rubs, or gallops.  Respiratory: Clear to auscultation bilaterally. GI: Soft, nontender, non-distended  MS: No edema; No deformity. Neuro:  Nonfocal   Psych: Normal affect   Labs    High Sensitivity Troponin:   Recent Labs  Lab 08/21/21 1337 08/21/21 1622  TROPONINIHS 15 14     Chemistry Recent Labs  Lab 08/21/21 1337 08/22/21 0619 08/23/21 0519  NA 136 137 134*  K 4.8 4.6 4.4  CL 97* 100 95*  CO2 27 29 29   GLUCOSE 211* 126* 148*  BUN 28* 30* 34*  CREATININE 1.54* 1.43* 1.47*  CALCIUM 9.3 9.2 8.9  MG  --   --  1.8  GFRNONAA 34* 38* 36*  ANIONGAP 12 8 10     Lipids No results for input(s): CHOL, TRIG, HDL, LABVLDL, LDLCALC, CHOLHDL in the last 168 hours.  Hematology Recent Labs  Lab 08/21/21 1337 08/22/21 0619 08/23/21 0519  WBC 7.6 6.1 6.7  RBC 3.68* 3.28* 3.09*  HGB 9.6* 8.5* 7.9*  HCT 32.1* 28.2* 26.4*  MCV 87.2 86.0 85.4  MCH 26.1 25.9* 25.6*  MCHC 29.9* 30.1 29.9*  RDW 14.3 14.4 14.3  PLT 244 203 210   Thyroid No results for input(s): TSH, FREET4 in the last 168 hours.  BNP Recent Labs  Lab 08/21/21 1444  BNP 45.7    DDimer No results for input(s): DDIMER in the last 168 hours.   Radiology    DG Chest 2 View  Result Date: 08/21/2021 CLINICAL DATA:  Fatigue, recent endocarditis, dizziness for few weeks, nausea since Saturday EXAM: CHEST - 2 VIEW COMPARISON:  07/03/2021 FINDINGS: LEFT subclavian sequential transvenous pacemaker leads project at RIGHT atrium and RIGHT ventricle. Enlargement of cardiac silhouette with pulmonary vascular congestion. Atherosclerotic calcification aorta. Scattered interstitial infiltrates in both lungs favoring pulmonary edema. No pleural effusion or pneumothorax. Mild persistent elevation of RIGHT diaphragm. No acute osseous findings. IMPRESSION: Enlargement of cardiac silhouette with pulmonary vascular congestion and persistent interstitial infiltrates favoring pulmonary edema. Aortic Atherosclerosis (ICD10-I70.0). Electronically Signed   By: Lavonia Dana M.D.   On: 08/21/2021 15:07   CT Head Wo Contrast  Result Date: 08/21/2021 CLINICAL DATA:  Dizziness EXAM: CT HEAD  WITHOUT CONTRAST TECHNIQUE: Contiguous axial images were obtained from the base of the skull through the vertex without intravenous contrast. RADIATION DOSE REDUCTION: This exam was performed according to the departmental dose-optimization program which includes automated exposure control, adjustment of the mA and/or kV according to patient size and/or use of iterative reconstruction technique. COMPARISON:  CT head 03/30/2015 FINDINGS: Brain: No acute intracranial hemorrhage, mass effect, or herniation. No extra-axial fluid collections. No evidence of acute territorial infarct. No hydrocephalus. Mild patchy hypodensities in the periventricular and subcortical white matter appears stable and likely represent chronic microvascular ischemic changes. Vascular: Calcified plaques in the carotid siphons. Skull: Normal. Negative for fracture or focal lesion. Sinuses/Orbits: Air-fluid level seen throughout all of the paranasal sinuses as well as moderate to severe opacification of the ethmoid air cells. Extensive opacification also seen in the bilateral mastoid air cells. Other: None. IMPRESSION: 1. No acute intracranial process identified. Chronic microvascular ischemic changes. 2. Evidence of acute pansinusitis as well as extensive opacification in the bilateral mastoid air cells, correlate clinically. Electronically Signed   By: Ofilia Neas M.D.   On: 08/21/2021 14:44   CT CHEST WO CONTRAST  Result Date: 08/21/2021 CLINICAL DATA:  Dizziness. EXAM: CT CHEST WITHOUT CONTRAST TECHNIQUE: Multidetector CT imaging of the chest was performed following the standard protocol without IV contrast. RADIATION DOSE REDUCTION: This exam was performed according to the departmental dose-optimization program which includes automated exposure control, adjustment of the mA and/or kV according to patient size and/or use of iterative reconstruction technique. COMPARISON:  Radiograph of same day.  CT of October 15, 2020. FINDINGS:  Cardiovascular: Atherosclerosis of thoracic aorta is noted without aneurysm formation. Mild cardiomegaly is noted. No pericardial effusion is noted. Mediastinum/Nodes: Stable enlarged thyroid gland with multiple small nodules. Stable calcified subcarinal adenopathy is noted consistent with prior granulomatous disease. Esophagus is unremarkable. Lungs/Pleura: No pneumothorax or pleural effusion is noted. Stable reticular opacities and bronchiectasis is seen in the upper and lower lobes bilaterally suggesting fibrosis or possible chronic pneumonitis. Interval development of 2.1 x 1.6 cm irregular density seen in the right lower lobe best seen on image 80 of series 3. This may represent focal inflammation or atelectasis, but neoplasm cannot be excluded. Evaluation is limited due to the lack of intravenous contrast. Upper Abdomen: No acute abnormality. Musculoskeletal: No chest wall mass or suspicious bone lesions identified. IMPRESSION: Stable large thyroid gland with multiple nodules. Not clinically significant; no follow-up imaging recommended. (Ref: J Am Coll Radiol. 2015 Feb;12(2): 143-50). There is again noted stable reticular opacities and bronchiectasis seen involving the upper and lower lobes bilaterally  suggesting fibrosis or possibly chronic pneumonitis. There is noted interval development of 2.1 x 1.6 cm irregular nodular density in the right lower lobe; this may represent focal inflammation or atelectasis, but neoplasm or malignancy cannot be excluded. Follow-up CT scan of the chest with intravenous contrast in 2-3 weeks is recommended to ensure stability or resolution. Aortic Atherosclerosis (ICD10-I70.0). Electronically Signed   By: Marijo Conception M.D.   On: 08/21/2021 17:15    Cardiac Studies   TEE July 04, 2021   1. Left ventricular ejection fraction, by estimation, is 60 to 65%. The  left ventricle has normal function. There is mild left ventricular  hypertrophy.   2. Peak RV-RA gradient  30 mmHg. There is a 1.6 x 1 cm mobile vegetation  attached to the pacemaker in the RA. Right ventricular systolic function  is normal. The right ventricular size is mildly enlarged.   3. Left atrial size was mildly dilated. No left atrial/left atrial  appendage thrombus was detected.   4. Right atrial size was moderately dilated.   5. The mitral valve is normal in structure. Mild to moderate mitral valve  regurgitation. No evidence of mitral stenosis.   6. There is mobile vegetation on the tricuspid valve. The tricuspid valve  is abnormal. Tricuspid valve regurgitation is moderate.   7. The aortic valve is tricuspid. Aortic valve regurgitation is not  visualized. Aortic valve sclerosis/calcification is present, without any  evidence of aortic stenosis.   8. Grade 3 plaque in the descending thoracic aorta.   9. No PFO or ASD by color doppler.   [place] 08/2020 1. Low/normal filling pressures.  2. Very mild pulmonary hypertension.  3. Preserved cardiac output.    Given ongoing dyspnea, needs pulmonary evaluation.   Patient Profile     76 y.o. female history of chronic diastolic heart failure, nonobstructive CAD, atrial fibrillation on eliquis, bradycardia s/p PPM [employer] investment [employer]), chronic respiratory failure on home O2, COPD, PAH, DM2, HTN, CKD (required HD in past), DVT s/p IVC filter 2004, HL, traumatic subarachnoid hemorrhage while on xarelto 2014, CVA 2014, OSA, and chronic lymphedema, presenting to the emergency room with symptomatic orthostasis/near syncope, weakness.  Assessment & Plan    Symptomatic orthostasis/near syncope - worse in the setting of recent COVID infection, poor oral intake and diuretic use - torsemide and spiro held - she is on midodrine 5mg  TID - she is still dizzy at rest and unable to check standing BP due to dizziness.  - caution with IVF given pulmonary HTN - may need to increase midodrine.  Pulmonary HTN - she was following advanced heart failure  clinic.  - continue PTA medications.  - restart Torsemide when kidney function normalizes  Recent COVID infection - may be contributing to breathing  Paroxysmal Afib - remains in NSR - continue amiodarone, digoxin - Eliquis for stroke ppx  ILD with chronic respiratory failure - she is on 4-5LO2 - she is on amiodarone, we will continue this for afib  For questions or updates, please contact [facility] Please consult [email or web address] for contact info under        Signed, Trystan Eads Ninfa Meeker, PA-C  08/23/2021, 12:25 PM

## 2021-08-23 NOTE — Assessment & Plan Note (Signed)
Cr stable and at baseline. ?

## 2021-08-23 NOTE — Progress Notes (Addendum)
Physical Therapy Treatment ?Patient Details ?Name: Debra Saunders ?MRN: [id number] ?DOB: Aug 19, 1945 ?Today's Date: 08/23/2021 ? ? ?History of Present Illness The patient is a 76 yo female that presented to the ED for chief complaint of dizziness, gait instability. Recently admitted for endocarditis, pacemaker site/wire infection, transferred to [facility]. Discharged to subacute rehab for IV antibiotic therapy and returned home about a week prior to this admission. Workup positive for SARS coronavirus 2, as well as acute pansinusitis as well as extensive opacification in the bilateral mastoid air cells. PMH includes HTN, afib, pacemaker, PAH, chronic resp failure (on home O2), CHF, cardiorenal syndrome, DM2, OSA, stroke, ICH, chronic back pain, arthritis. ? ?  ?PT Comments  ? ? Pt awake and alert resting in recliner upon PT entrance into room for today's session. Pt denies any c/o of pain at rest, notes still having some dizziness and diplopia while sitting in recliner; reporting "I see two of you". Pt is able to perform sit to stand w/ CGA using RW to transfer from recliner to bed. She was able to complete LE strength exercises in both the recliner (prior to transfer) and supine in bed (see "General LE Exercise" section). Pt reported slight increases in dizziness throughout session. Pt will benefit from continued skilled PT in order to increase LE strength, improve mobility/gait, decrease c/o dizziness, and restore PLOF. Current discharge recommendation remains appropriate due to the level of assistance required by the patient to ensure safety and improve overall function. ?  ?Recommendations for follow up therapy are one component of a multi-disciplinary discharge planning process, led by the attending physician.  Recommendations may be updated based on patient status, additional functional criteria and insurance authorization. ? ?Follow Up Recommendations ? Home health PT ?  ?  ?Assistance Recommended at Discharge  Frequent or constant Supervision/Assistance  ?Patient can return home with the following A little help with walking and/or transfers;A little help with bathing/dressing/bathroom;Assistance with cooking/housework;Assist for transportation;Help with stairs or ramp for entrance ?  ?Equipment Recommendations ? Rolling walker (2 wheels)  ?  ?Recommendations for Other Services   ? ? ?  ?Precautions / Restrictions Precautions ?Precautions: Fall ?Restrictions ?Weight Bearing Restrictions: No  ?  ? ?Mobility ? Bed Mobility ?Overal bed mobility: Needs Assistance ?Bed Mobility: Sit to Supine ?  ?  ?  ?Sit to supine: Supervision ?  ?  ?  ? ?Transfers ?Overall transfer level: Needs assistance ?Equipment used: Rolling walker (2 wheels) ?Transfers: Sit to/from Stand, Bed to chair/wheelchair/BSC ?Sit to Stand: Min guard ?  ?Step pivot transfers: Min guard ?  ?  ?  ?  ?  ? ?Ambulation/Gait ?  ?  ?  ?  ?  ?  ?  ?  ? ? ?Stairs ?  ?  ?  ?  ?  ? ? ?Wheelchair Mobility ?  ? ?Modified Rankin (Stroke Patients Only) ?  ? ? ?  ?Balance Overall balance assessment: Needs assistance ?Sitting-balance support: Bilateral upper extremity supported, Feet supported ?Sitting balance-Leahy Scale: Good ?  ?  ?Standing balance support: Bilateral upper extremity supported, During functional activity, Reliant on assistive device for balance ?Standing balance-Leahy Scale: Fair ?  ?  ?  ?  ?  ?  ?  ?  ?  ?  ?  ?  ?  ? ?  ?Cognition Arousal/Alertness: Awake/alert ?Behavior During Therapy: [facility] for tasks assessed/performed ?Overall Cognitive Status: Within Functional Limits for tasks assessed ?  ?  ?  ?  ?  ?  ?  ?  ?  ?  ?  ?  ?  ?  ?  ?  ?  ?  ?  ? ?  ?  Exercises General Exercises - Lower Extremity ?Ankle Circles/Pumps: AROM, Both, 10 reps ?Long Arc Quad: AROM, Both, 10 reps ?Hip ABduction/ADduction: AROM, Both, 10 reps ?Straight Leg Raises: AROM, Both, 10 reps ?Hip Flexion/Marching: AROM, Both, 10 reps ? ?  ?General Comments   ?  ?  ? ?Pertinent Vitals/Pain  Pain Assessment ?Pain Assessment: No/denies pain  ? ? ?Home Living   ?  ?  ?  ?  ?  ?  ?  ?  ?  ?   ?  ?Prior Function    ?  ?  ?   ? ?PT Goals (current goals can now be found in the care plan section) Progress towards PT goals: Progressing toward goals ? ?  ?Frequency ? ? ? Min 2X/week ? ? ? ?  ?PT Plan Current plan remains appropriate  ? ? ?Co-evaluation   ?  ?  ?  ?  ? ?  ?AM-PAC PT "6 Clicks" Mobility   ?Outcome Measure ? Help needed turning from your back to your side while in a flat bed without using bedrails?: A Little ?Help needed moving from lying on your back to sitting on the side of a flat bed without using bedrails?: A Little ?Help needed moving to and from a bed to a chair (including a wheelchair)?: A Little ?Help needed standing up from a chair using your arms (e.g., wheelchair or bedside chair)?: A Little ?Help needed to walk in hospital room?: A Little ?Help needed climbing 3-5 steps with a railing? : A Lot ?6 Click Score: 17 ? ?  ?End of Session   ?Activity Tolerance: Patient tolerated treatment well ?Patient left: in bed;with call bell/phone within reach;with bed alarm set ?Nurse Communication: Mobility status ?PT Visit Diagnosis: Unsteadiness on feet (R26.81);Muscle weakness (generalized) (M62.81);Other abnormalities of gait and mobility (R26.89) ?  ? ? ?Time: 4496-7591 ?PT Time Calculation (min) (ACUTE ONLY): 18 min ? ?Charges:  $Therapeutic Exercise: 8-22 mins          ?          ? ? ?Jonnie Kind, SPT ?08/23/2021, 3:43 PM ? ?

## 2021-08-23 NOTE — Progress Notes (Addendum)
?Progress Note ? ? ?Patient: Debra Saunders ENI:[id number] DOB: 13-Jan-1946 DOA: 08/21/2021     2 ?DOS: the patient was seen and examined on 08/23/2021 ?  ?Brief hospital course: ?Debra Saunders is a 76 y.o. female with medical history significant for recent endocarditis, pacemaker site/wire infection discharged to subacute rehab for IV antibiotic therapy and returned home about a week prior to admission, history of chronic respiratory failure on 4 L of oxygen continuous history of chronic diastolic dysfunction CHF, history of A-fib status post DCCV, history of interstitial lung disease, pulmonary hypertension who was brought into the ER by EMS for evaluation of dizziness which she states that she has chronically but this episode is different.  She states that over the last several days she gets dizzy with any form of movement and her gait is very unsteady despite the use of an assist device.  She is having to hold onto her daughter to prevent her from falling.  She complains of a lot of postnasal drip as well as noises in her ears.  She complains of feeling very weak and tired. ? ?Assessment and Plan: ?* Orthostatic hypotension ?--Per PT eval on 3/2, BP dropped to 60's-70's when standing ?--Pt said this is a known problem, and takes midodrine to increase BP ?--cardiology consulted ?Plan: ?--Hold home torsemide and aldactone for now ?--increase home midodrine to 10 mg TID ?--apply TED hose and abdominal binder ? ? ?Anemia due to chronic kidney disease ?--baseline Hgb 7-8 ?Plan: ?--1u pRBC today for Hgb 7.9 in the hopes of improving her symptoms of weakness ? ?Stage 3b chronic kidney disease (CKD) ([place]) ?Cr stable and at baseline. ? ?Endocarditis ?Patient recently treated for endocarditis/infection due to implanted cardiac device. ?Recently completed IV Cefazolin through 08/08/21 and currently of Cefadroxil 500 mg BID. ?Patient is currently on IV Rocephin for her pansinusitis and will need to be restarted on her  Cefadroxil upon discharge. ?--cont ceftriaxone for now ? ?History of COVID-19 ?Patient had COVID-19 viral infection in January while in subacute rehab ?Her COVID-19 PCR test is positive during this hospitalization ?COVID-19 cycle threshold is 38 and patient is no longer considered infectious ?No indication for isolation at this time ? ?Generalized weakness ?Secondary to chronic illness and acutely worsened by dizziness and inability to get out of bed ?Place patient on fall precautions ?PT evaluation ? ?Chronic diastolic CHF (congestive heart failure) ([place]) ?Patient has a history of chronic diastolic dysfunction CHF ?--takes torsemide 60 mg twice daily as well as spironolactone at home.  Likely dehydrated, since BUN elevated from baseline.  Both held after morning dose on 3/2. ?Plan: ?--cont to hold torsemide and aldactone ? ?Obesity (BMI 30-39.9) ?Complicates overall prognosis and care ?Lifestyle modification and exercise has been discussed with patient in detail ? ?Pansinusitis ?Patient presents for evaluation of vertigo and is found to have pansinusitis on CT head ?--started on ceftriaxone  ? ?Atrial fibrillation with tachy brady syndrome  ?History of paroxysmal A-fib ?Continue amiodarone and digoxin for rate control ?Continue Eliquis as primary prophylaxis for an acute stroke ? ?Interstitial lung disease ([place]) ?With chronic hypoxemic respiratory failure ?Associated with pulmonary hypertension ?--Unfortunately few options for her atrial fibrillation, will continue amiodarone, per cardio ?--Continue home sildenafil,selexipag and macitentan ?--Continue supplemental O2 to keep sats between 88-92% ? ? ? ? ? ? ? ? ?Subjective:  ?Pt reported post-nasal drainage, sinuses still felt clogged up.   ? ?Still severely orthostatic today. ? ? ?Physical Exam: ?Vitals:  ? 08/23/21 0015 08/23/21 0309  08/23/21 0802 08/23/21 1434  ?BP: (!) 115/48 (!) 113/49 (!) 105/54 (!) 121/50  ?Pulse: 69 68 60 (!) 59  ?Resp: 20 15 18 17   ?Temp:  98.5 ?F (36.9 ?C) 98.2 ?F (36.8 ?C) 98.4 ?F (36.9 ?C) 98.3 ?F (36.8 ?C)  ?TempSrc:  Axillary    ?SpO2: 96% 96% 100% 98%  ?Weight:      ?Height:      ? ? ?Constitutional: NAD, AAOx3, sitting in recliner ?HEENT: conjunctivae and lids normal, EOMI ?CV: No cyanosis.   ?RESP: normal respiratory effort, on 3L ?Extremities: chronic more swelling on the RLE. ?SKIN: warm, dry ?Neuro: II - XII grossly intact.   ?Psych: Normal mood and affect.  Appropriate judgement and reason ? ? ?Data Reviewed: ? ?Family Communication:  ? ?Disposition: ?Status is: Inpatient ?Remains inpatient appropriate because: severely orthostatic ? ? Planned Discharge Destination: pending PT eval ? ? ? ? ?Time spent: 50 minutes ? ?Author: ?Enzo Bi, MD ?08/23/2021 3:48 PM ? ?For on call review [email or web address].  ? ?

## 2021-08-23 NOTE — Assessment & Plan Note (Addendum)
--  baseline Hgb 7-8 ?--1u pRBC on 3/3 for Hgb 7.9 in the hopes of improving her symptoms of weakness ?Plan: ?--monitor for Hgb, with goal of 8. ?

## 2021-08-23 NOTE — TOC Initial Note (Addendum)
Transition of Care (TOC) - Initial/Assessment Note  ? ? ?Patient Details  ?Name: Debra Saunders ?MRN: [id number] ?Date of Birth: 03-18-1946 ? ?Transition of Care (TOC) CM/SW Contact:    ?Alberteen Sam, LCSW ?Phone Number: ?08/23/2021, 8:53 AM ? ?Clinical Narrative:                 ? ?Update: Patient is active with [facility] PT/OT will need resumption orders at discharge. ? ? ? ?CSW spoke with patient's daughter. Confirmed patient recently left [facility] and was set up with home health services however unsure of agency.  ? ?Reports patient has rollator and walker at home, no dme needs.  ? ?Daughter reports patient may need ems transport home at time of discharge, TBD if patient regains more strength per daughter.  ? ?CSW is following up to identify which home health agency patient is active with so services can be resumed at time of discharge. ? ?TOC will continue to follow for needs.  ? ?Expected Discharge Plan: [place] ?Barriers to Discharge: Continued Medical Work up ? ? ?Patient Goals and CMS Choice ?Patient states their goals for this hospitalization and ongoing recovery are:: to go home ?CMS Medicare.gov Compare Post Acute Care list provided to:: Patient ?Choice offered to / list presented to : Patient ? ?Expected Discharge Plan and Services ?Expected Discharge Plan: [place] ?  ?  ?  ?Living arrangements for the past 2 months: [place] ?                ?  ?  ?  ?  ?  ?  ?  ?  ?  ?  ? ?Prior Living Arrangements/Services ?Living arrangements for the past 2 months: [place] ?Lives with:: Self ?  ?Do you feel safe going back to the place where you live?: Yes      ?  ?  ?  ?  ? ?Activities of Daily Living ?Home Assistive Devices/Equipment: CBG Meter, Shower chair without back, Walker (specify type), Oxygen ?ADL Screening (condition at time of admission) ?Patient's cognitive ability adequate to safely complete daily activities?: Yes ?Is the patient deaf or  have difficulty hearing?: No ?Does the patient have difficulty seeing, even when wearing glasses/contacts?: No ?Does the patient have difficulty concentrating, remembering, or making decisions?: No ?Patient able to express need for assistance with ADLs?: Yes ?Does the patient have difficulty dressing or bathing?: No ?Independently performs ADLs?: Yes (appropriate for developmental age) ?Does the patient have difficulty walking or climbing stairs?: Yes ?Weakness of Legs: Both ?Weakness of Arms/Hands: None ? ?Permission Sought/Granted ?  ?  ?   ?   ?   ?   ? ?Emotional Assessment ?  ?  ?  ?Orientation: : Oriented to Self, Oriented to Place, Oriented to  Time, Oriented to Situation ?Alcohol / Substance Use: Not Applicable ?Psych Involvement: No (comment) ? ?Admission diagnosis:  Generalized weakness [R53.1] ?Heart murmur, systolic [K93.8] ?Acute pansinusitis, recurrence not specified [J01.40] ?Patient Active Problem List  ? Diagnosis Date Noted  ? 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 Orthostatic hypotension 08/22/2021  ? Generalized weakness 08/21/2021  ? Acute pansinusitis 08/21/2021  ? History of COVID-19   ? Infection due to implanted cardiac device ([place]) 07/25/2021  ? Peripherally inserted central catheter in place 07/25/2021  ? Abdominal pain 07/06/2021  ? Pressure injury of skin 07/01/2021  ? Bacteremia due to Streptococcus 06/29/2021  ? Acute blood loss anemia 06/29/2021  ?  klebsiella pneumoniae UTI 06/29/2021  ? Septic shock ([place]) 06/28/2021  ? Sepsis due to pneumonia ([place]) 06/25/2021  ? Multifocal pneumonia 06/25/2021  ? GI bleeding 06/25/2021  ? Black stool 06/25/2021  ? Severe sepsis with septic shock secondary to infected pacemaker with streptoccos bacteremia  06/25/2021  ? HTN (hypertension)   ? HLD (hyperlipidemia)   ? Chronic diastolic CHF (congestive heart failure) ([name])   ? Acute renal failure superimposed on stage 3a chronic kidney disease ([place])   ? DVT (deep venous thrombosis) ([place])   ? Hyponatremia   ?  Thrombocytopenia ([place])   ? Diarrhea   ? AKI (acute kidney injury) ([place])   ? Upper respiratory infection 05/24/2021  ? Burn 04/30/2020  ? Obesity (BMI 30-39.9) 04/30/2020  ? Elevated alkaline phosphatase level 04/30/2020  ? Chronic diarrhea 11/04/2019  ? Dysuria 09/29/2019  ? Abnormal mammogram 09/29/2019  ? Breast pain, right 08/08/2019  ? Bilateral ovarian cysts 08/08/2019  ? B12 deficiency 08/02/2019  ? Iron deficiency 08/01/2019  ? Pansinusitis 07/06/2019  ? Shortness of breath   ? Atrial fibrillation with tachy brady syndrome  09/22/2017  ? Acute bilateral low back pain without sciatica 01/28/2017  ? Pain in limb 07/22/2016  ? Neuropathy 06/09/2016  ? UTI (urinary tract infection) 03/03/2016  ? Chronic deep vein thrombosis (DVT) (H[facility]) 03/03/2016  ? Chronic back pain 10/19/2015  ? Goiter 04/25/2015  ? Cervical disc disorder with radiculopathy of cervical region 03/30/2015  ? 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 Interstitial lung disease ([place]) 04/06/2014  ? Abnormality of gait 01/13/2014  ? Sinoatrial node dysfunction ([place]) 01/09/2014  ? Allergic rhinitis 10/26/2013  ? Lymphedema 08/03/2013  ? Constipation 04/12/2013  ? Costochondritis 01/19/2013  ? Fatigue 09/27/2012  ? Normocytic anemia 04/28/2012  ? Chronic respiratory failure ([place]) 04/26/2012  ? OSA (obstructive sleep apnea) 04/22/2012  ? Pulmonary hypertension, unspecified ([place]) 03/30/2012  ? Hyperlipidemia 03/30/2012  ? Hypertension   ? ?PCP:  [name], MD ?Pharmacy:   ?[facility] [place] [facility] [address] ?[address] [address] ?Phone: [phone] Fax: [phone] ? ?[facility] [place] [address] ?[place] [address] ?Phone: [phone] Fax: [phone] ? ?[facility] [place] ?[place] [address] ?Phone: [phone] Fax:  [phone] ? ?[place], [address] ?[place] [address] ?Phone: [phone] Fax: [phone] ? ?[place], [address] ?[place] [address] ?Phone: [phone] Fax: [phone] ? ?[facility] [address] ?[place] [address] ?Phone: [phone] Fax: [phone] ? ? ? ? ?Social Determinants of Health (SDOH) Interventions ?  ? ?Readmission Risk Interventions ?No flowsheet data found. ? ? ?

## 2021-08-23 NOTE — Progress Notes (Signed)
*  PRELIMINARY RESULTS* ?Echocardiogram ?2D Echocardiogram has been performed. ? ?Doniqua Saxby, Sonia Side ?08/23/2021, 2:13 PM ?

## 2021-08-23 NOTE — Progress Notes (Signed)
Mobility Specialist - Progress Note ? ? 08/23/21 1200  ?Orthostatic Lying   ?BP- Lying 106/48  ?Pulse- Lying 59  ?Orthostatic Sitting  ?BP- Sitting 93/56  ?Pulse- Sitting 60  ?Orthostatic Standing at 0 minutes  ?BP- Standing at 0 minutes (!) 74/50  ?Pulse- Standing at 0 minutes 69  ?Mobility  ?Activity Transferred from bed to chair;Dangled on edge of bed;Stood at bedside  ?Level of Assistance Standby assist, set-up cues, supervision of patient - no hands on  ?Assistive Device Front wheel walker  ?Distance Ambulated (ft) 2 ft  ?Activity Response Tolerated well  ?$Mobility charge 1 Mobility  ? ? ? ?Orthostatics recorded above. Does voice 6/10 dizziness upon sitting that does increase to 10/10 once standing. Pt states "I feel like I'm about to pass out". VC to keep eyes open; mobility keeps pt engaged in conversation while BP is being recorded. Noted soiled chucks, NT able to assist with quick linen change. O2 maintained high 90s on 4L. Pt transferred to chair with needs in reach. RN notified.  ? ? ?Kathee Delton ?Mobility Specialist ?08/23/21, 1:02 PM ? ? ? ? ?

## 2021-08-24 DIAGNOSIS — I951 Orthostatic hypotension: Secondary | ICD-10-CM | POA: Diagnosis not present

## 2021-08-24 LAB — TYPE AND SCREEN
ABO/RH(D): B POS
Antibody Screen: NEGATIVE
Unit division: 0

## 2021-08-24 LAB — BPAM RBC
Blood Product Expiration Date: 202303212359
ISSUE DATE / TIME: 202303031750
Unit Type and Rh: 7300

## 2021-08-24 LAB — CBC
HCT: 29.5 % — ABNORMAL LOW (ref 36.0–46.0)
Hemoglobin: 9.1 g/dL — ABNORMAL LOW (ref 12.0–15.0)
MCH: 26 pg (ref 26.0–34.0)
MCHC: 30.8 g/dL (ref 30.0–36.0)
MCV: 84.3 fL (ref 80.0–100.0)
Platelets: 230 10*3/uL (ref 150–400)
RBC: 3.5 MIL/uL — ABNORMAL LOW (ref 3.87–5.11)
RDW: 14 % (ref 11.5–15.5)
WBC: 6.4 10*3/uL (ref 4.0–10.5)
nRBC: 0 % (ref 0.0–0.2)

## 2021-08-24 LAB — BASIC METABOLIC PANEL
Anion gap: 10 (ref 5–15)
BUN: 33 mg/dL — ABNORMAL HIGH (ref 8–23)
CO2: 28 mmol/L (ref 22–32)
Calcium: 8.8 mg/dL — ABNORMAL LOW (ref 8.9–10.3)
Chloride: 99 mmol/L (ref 98–111)
Creatinine, Ser: 1.39 mg/dL — ABNORMAL HIGH (ref 0.44–1.00)
GFR, Estimated: 39 mL/min — ABNORMAL LOW (ref >60)
Glucose, Bld: 123 mg/dL — ABNORMAL HIGH (ref 70–99)
Potassium: 4 mmol/L (ref 3.5–5.1)
Sodium: 137 mmol/L (ref 135–145)

## 2021-08-24 LAB — DIGOXIN LEVEL: Digoxin Level: 1.1 ng/mL (ref 0.8–2.0)

## 2021-08-24 LAB — MAGNESIUM: Magnesium: 2.1 mg/dL (ref 1.7–2.4)

## 2021-08-24 NOTE — Progress Notes (Addendum)
Progress Note  Patient Name: Debra Saunders Date of Encounter: 08/24/2021  [facility] Cardiologist: [name], MD   Subjective   Bps better on increased dose of midodrine. Kidney function improving. Needs repeat orthostatics today.    Inpatient Medications    Scheduled Meds:  sodium chloride   Intravenous Once   amiodarone  200 mg Oral Daily   apixaban  5 mg Oral BID   Difluprednate  1 drop Ophthalmic BID   digoxin  0.125 mg Oral Daily   DULoxetine  60 mg Oral Daily   fluticasone  2 spray Each Nare Daily   gabapentin  600 mg Oral TID   macitentan  10 mg Oral Daily   midodrine  10 mg Oral TID WC   rosuvastatin  10 mg Oral Daily   Selexipag  1,600 mcg Oral BID   sildenafil  20 mg Oral TID   sodium chloride flush  3 mL Intravenous Q12H   Continuous Infusions:  sodium chloride     cefTRIAXone (ROCEPHIN)  IV 2 g (08/23/21 2133)   PRN Meds: sodium chloride, acetaminophen **OR** acetaminophen, albuterol, meclizine, ondansetron **OR** ondansetron (ZOFRAN) IV, sodium chloride flush   Vital Signs    Vitals:   08/23/21 2343 08/24/21 0259 08/24/21 0500 08/24/21 0739  BP: (!) 153/66 (!) 121/55  (!) 121/50  Pulse: (!) 59 60  60  Resp: '20 20  19  '$ Temp: 98.1 F (36.7 C) 98.1 F (36.7 C)  97.9 F (36.6 C)  TempSrc:      SpO2: 96% 95%  95%  Weight:   78.6 kg   Height:        Intake/Output Summary (Last 24 hours) at 08/24/2021 0856 Last data filed at 08/24/2021 0737 Gross per 24 hour  Intake 612 ml  Output 900 ml  Net -288 ml   Last 3 Weights 08/24/2021 08/21/2021 08/21/2021  Weight (lbs) 173 lb 4.5 oz 181 lb 3.5 oz 182 lb  Weight (kg) 78.6 kg 82.2 kg 82.555 kg      Telemetry    AV paced rhythm, HR 60s - Personally Reviewed  ECG    No new - Personally Reviewed  Physical Exam   GEN: No acute distress.   Neck: No JVD Cardiac: RRR, + murmur, no rubs, or gallops.  Respiratory: Clear to auscultation bilaterally. GI: Soft, nontender, non-distended  MS: No  edema; No deformity. Neuro:  Nonfocal  Psych: Normal affect   Labs    High Sensitivity Troponin:   Recent Labs  Lab 08/21/21 1337 08/21/21 1622  TROPONINIHS 15 14     Chemistry Recent Labs  Lab 08/22/21 0619 08/23/21 0519 08/24/21 0558  NA 137 134* 137  K 4.6 4.4 4.0  CL 100 95* 99  CO2 '29 29 28  '$ GLUCOSE 126* 148* 123*  BUN 30* 34* 33*  CREATININE 1.43* 1.47* 1.39*  CALCIUM 9.2 8.9 8.8*  MG  --  1.8 2.1  GFRNONAA 38* 36* 39*  ANIONGAP '8 10 10    '$ Lipids No results for input(s): CHOL, TRIG, HDL, LABVLDL, LDLCALC, CHOLHDL in the last 168 hours.  Hematology Recent Labs  Lab 08/22/21 0619 08/23/21 0519 08/24/21 0558  WBC 6.1 6.7 6.4  RBC 3.28* 3.09* 3.50*  HGB 8.5* 7.9* 9.1*  HCT 28.2* 26.4* 29.5*  MCV 86.0 85.4 84.3  MCH 25.9* 25.6* 26.0  MCHC 30.1 29.9* 30.8  RDW 14.4 14.3 14.0  PLT 203 210 230   Thyroid No results for input(s): TSH, FREET4 in the  last 168 hours.  BNP Recent Labs  Lab 08/21/21 1444  BNP 45.7    DDimer No results for input(s): DDIMER in the last 168 hours.   Radiology    ECHOCARDIOGRAM COMPLETE  Result Date: 08/23/2021    ECHOCARDIOGRAM REPORT   Patient Name:   Debra Saunders Date of Exam: 08/23/2021 Medical Rec #:  [id number]        Height:       60.0 in Accession #:    [id number]       Weight:       181.2 lb Date of Birth:  1945-09-27         BSA:          1.790 m Patient Age:    76 years         BP:           105/54 mmHg Patient Gender: F                HR:           60 bpm. Exam Location:  [facility] Procedure: 2D Echo, Cardiac Doppler and Color Doppler Indications:     Endocarditis I38  History:         Patient has prior history of Echocardiogram examinations, most                  recent 07/04/2021. Pacemaker, Arrythmias:Atrial Fibrillation,                  Signs/Symptoms:Chest Pain; Risk Factors:Hypertension.  Sonographer:     Sherrie Sport Referring Phys:  [place] Diagnosing Phys: Ida Rogue MD  Sonographer Comments: Suboptimal  apical window. IMPRESSIONS  1. Left ventricular ejection fraction, by estimation, is 60 to 65%. The left ventricle has normal function. The left ventricle has no regional wall motion abnormalities. There is mild left ventricular hypertrophy. Left ventricular diastolic parameters are consistent with Grade I diastolic dysfunction (impaired relaxation).  2. Right ventricular systolic function is normal. The right ventricular size is normal. There is mildly elevated pulmonary artery systolic pressure. The estimated right ventricular systolic pressure is 63.8 mmHg.  3. Left atrial size was moderately dilated.  4. The mitral valve is normal in structure. No evidence of mitral valve regurgitation. No evidence of mitral stenosis.  5. Tricuspid valve regurgitation is moderate.  6. The aortic valve is normal in structure. Aortic valve regurgitation is not visualized. Aortic valve sclerosis/calcification is present, without any evidence of aortic stenosis.  7. The inferior vena cava is normal in size with greater than 50% respiratory variability, suggesting right atrial pressure of 3 mmHg.  8. RV pacer wire difficult to visualize, grossly no vegetation noted. FINDINGS  Left Ventricle: Left ventricular ejection fraction, by estimation, is 60 to 65%. The left ventricle has normal function. The left ventricle has no regional wall motion abnormalities. The left ventricular internal cavity size was normal in size. There is  mild left ventricular hypertrophy. Left ventricular diastolic parameters are consistent with Grade I diastolic dysfunction (impaired relaxation). Right Ventricle: The right ventricular size is normal. No increase in right ventricular wall thickness. Right ventricular systolic function is normal. There is mildly elevated pulmonary artery systolic pressure. The tricuspid regurgitant velocity is 2.97  m/s, and with an assumed right atrial pressure of 5 mmHg, the estimated right ventricular systolic pressure is 46.6  mmHg. Left Atrium: Left atrial size was moderately dilated. Right Atrium: Right atrial size was normal in size. Pericardium: There  is no evidence of pericardial effusion. Mitral Valve: The mitral valve is normal in structure. No evidence of mitral valve regurgitation. No evidence of mitral valve stenosis. MV peak gradient, 3.7 mmHg. The mean mitral valve gradient is 1.0 mmHg. Tricuspid Valve: The tricuspid valve is normal in structure. Tricuspid valve regurgitation is moderate . No evidence of tricuspid stenosis. Aortic Valve: The aortic valve is normal in structure. Aortic valve regurgitation is not visualized. Aortic valve sclerosis/calcification is present, without any evidence of aortic stenosis. Aortic valve mean gradient measures 5.0 mmHg. Aortic valve peak  gradient measures 8.2 mmHg. Aortic valve area, by VTI measures 2.80 cm. Pulmonic Valve: The pulmonic valve was normal in structure. Pulmonic valve regurgitation is mild. No evidence of pulmonic stenosis. Aorta: The aortic root is normal in size and structure. Venous: The inferior vena cava is normal in size with greater than 50% respiratory variability, suggesting right atrial pressure of 3 mmHg. IAS/Shunts: No atrial level shunt detected by color flow Doppler. Additional Comments: A device lead is visualized.  LEFT VENTRICLE PLAX 2D LVIDd:         4.00 cm   Diastology LVIDs:         2.20 cm   LV e' medial:    4.90 cm/s LV PW:         1.30 cm   LV E/e' medial:  13.0 LV IVS:        1.30 cm   LV e' lateral:   4.13 cm/s LVOT diam:     2.00 cm   LV E/e' lateral: 15.4 LV SV:         63 LV SV Index:   35 LVOT Area:     3.14 cm  RIGHT VENTRICLE RV S prime:     19.50 cm/s TAPSE (M-mode): 2.7 cm LEFT ATRIUM             Index        RIGHT ATRIUM           Index LA diam:        3.20 cm 1.79 cm/m   RA Area:     15.50 cm LA Vol (A2C):   98.9 ml 55.25 ml/m  RA Volume:   30.80 ml  17.21 ml/m LA Vol (A4C):   66.3 ml 37.04 ml/m LA Biplane Vol: 80.6 ml 45.03 ml/m   AORTIC VALVE                    PULMONIC VALVE AV Area (Vmax):    2.46 cm     PV Vmax:        0.90 m/s AV Area (Vmean):   2.32 cm     PV Vmean:       53.100 cm/s AV Area (VTI):     2.80 cm     PV VTI:         0.128 m AV Vmax:           143.00 cm/s  PV Peak grad:   3.2 mmHg AV Vmean:          99.500 cm/s  PV Mean grad:   1.0 mmHg AV VTI:            0.223 m      RVOT Peak grad: 9 mmHg AV Peak Grad:      8.2 mmHg AV Mean Grad:      5.0 mmHg LVOT Vmax:         112.00 cm/s LVOT Vmean:  73.500 cm/s LVOT VTI:          0.199 m LVOT/AV VTI ratio: 0.89  AORTA Ao Root diam: 3.20 cm MITRAL VALVE               TRICUSPID VALVE MV Area (PHT): 1.82 cm    TR Peak grad:   35.3 mmHg MV Area VTI:   1.98 cm    TR Vmax:        297.00 cm/s MV Peak grad:  3.7 mmHg MV Mean grad:  1.0 mmHg    SHUNTS MV Vmax:       0.96 m/s    Systemic VTI:  0.20 m MV Vmean:      49.8 cm/s   Systemic Diam: 2.00 cm MV Decel Time: 416 msec    Pulmonic VTI:  0.208 m MV E velocity: 63.80 cm/s MV A velocity: 86.50 cm/s MV E/A ratio:  0.74 Ida Rogue MD Electronically signed by Ida Rogue MD Signature Date/Time: 08/23/2021/2:28:52 PM    Final     Cardiac Studies   Echo 08/23/21   1. Left ventricular ejection fraction, by estimation, is 60 to 65%. The  left ventricle has normal function. The left ventricle has no regional  wall motion abnormalities. There is mild left ventricular hypertrophy.  Left ventricular diastolic parameters  are consistent with Grade I diastolic dysfunction (impaired relaxation).   2. Right ventricular systolic function is normal. The right ventricular  size is normal. There is mildly elevated pulmonary artery systolic  pressure. The estimated right ventricular systolic pressure is 97.4 mmHg.   3. Left atrial size was moderately dilated.   4. The mitral valve is normal in structure. No evidence of mitral valve  regurgitation. No evidence of mitral stenosis.   5. Tricuspid valve regurgitation is moderate.   6.  The aortic valve is normal in structure. Aortic valve regurgitation is  not visualized. Aortic valve sclerosis/calcification is present, without  any evidence of aortic stenosis.   7. The inferior vena cava is normal in size with greater than 50%  respiratory variability, suggesting right atrial pressure of 3 mmHg.   8. RV pacer wire difficult to visualize, grossly no vegetation noted.   TEE July 04, 2021   1. Left ventricular ejection fraction, by estimation, is 60 to 65%. The  left ventricle has normal function. There is mild left ventricular  hypertrophy.   2. Peak RV-RA gradient 30 mmHg. There is a 1.6 x 1 cm mobile vegetation  attached to the pacemaker in the RA. Right ventricular systolic function  is normal. The right ventricular size is mildly enlarged.   3. Left atrial size was mildly dilated. No left atrial/left atrial  appendage thrombus was detected.   4. Right atrial size was moderately dilated.   5. The mitral valve is normal in structure. Mild to moderate mitral valve  regurgitation. No evidence of mitral stenosis.   6. There is mobile vegetation on the tricuspid valve. The tricuspid valve  is abnormal. Tricuspid valve regurgitation is moderate.   7. The aortic valve is tricuspid. Aortic valve regurgitation is not  visualized. Aortic valve sclerosis/calcification is present, without any  evidence of aortic stenosis.   8. Grade 3 plaque in the descending thoracic aorta.   9. No PFO or ASD by color doppler.    [place] 08/2020 1. Low/normal filling pressures.  2. Very mild pulmonary hypertension.  3. Preserved cardiac output.    Given ongoing dyspnea, needs pulmonary evaluation.   Patient Profile  76 y.o. female with a history of chronic diastolic heart failure, nonobstructive CAD, atrial fibrillation on eliquis, bradycardia s/p PPM Corporate investment banker), chronic respiratory failure on home O2, COPD, PAH, DM2, HTN, CKD (required HD in past), DVT s/p IVC filter 2004,  HL, traumatic subarachnoid hemorrhage while on xarelto 2014, CVA 2014, OSA, and chronic lymphedema, presenting to the emergency room with symptomatic orthostasis/near syncope, weakness  Assessment & Plan    Symptomatic orthostasis/near syncope - worse in the setting of recent COVID infection, poor oral intake and diuretic use - torsemide and spiro held on admission - Echo this admission showed LVEF 60-65%, no WMA, mild LVH, G1DD, mildly elevated pulmonary artery pressure, moderately dilated LA, mod TR - pt had persistent orthostatic hypotension and dizziness and midodrine increased to '10mg'$  TID - Most recent orthostatics 3/3 BP showed drop from sitting to standing SBP 93>74 - she has not been up yet today - re-check orthostatics   Pulmonary HTN - she was following advanced heart failure clinic.  - continue PTA medications.  - restart Torsemide when kidney function normalizes   Recent COVID infection - tested positive 2 weeks ago - may be contributing to breathing   Paroxysmal Afib - remains in NSR - continue amiodarone, digoxin - Eliquis for stroke ppx   ILD with chronic respiratory failure - she is on 4-5LO2 - she is on amiodarone, we [place] [place] continue this for afib  For questions or updates, please contact [facility] Please consult [email or web address] for contact info under        Signed, Cadence Ninfa Meeker, PA-C  08/24/2021, 8:57 AM     I have seen and examined this patient with Cadence Furth.  Agree with above, note added to reflect my findings.  Feeling mildly improved, though continued symptoms of orthostasis.  GEN: Well nourished, well developed, in no acute distress  HEENT: normal  Neck: no JVD, carotid bruits, or masses Cardiac: RRR; no murmurs, rubs, or gallops,no edema  Respiratory:  clear to auscultation bilaterally, normal work of breathing GI: soft, nontender, nondistended, + BS MS: no deformity or atrophy  Skin: warm and dry, device site well healed Neuro:   Strength and sensation are intact Psych: euthymic mood, full affect   Orthostatic hypotension: Currently on midodrine 10 mg 3 times daily.  She is continuing to have symptoms.  I have discussed with the [employer] rep who Lalah Durango come and likely on Monday to turn on minute ventilation which may help with her symptoms. Pulmonary hypertension: Continue current medications.  Well-controlled based on most recent echo. Paroxysmal atrial fibrillation: Continue amiodarone, digoxin, Eliquis.  Beckem Tomberlin M. Jonah Nestle MD 08/24/2021 12:15 PM

## 2021-08-24 NOTE — Progress Notes (Signed)
?Progress Note ? ? ?Patient: Debra Saunders:[id number] DOB: [birth year] DOA: 08/21/2021     3 ?DOS: the patient was seen and examined on 08/24/2021 ?  ?Brief hospital course: ?Debra Saunders is a 76 y.o. female with medical history significant for recent endocarditis, pacemaker site/wire infection discharged to subacute rehab for IV antibiotic therapy and returned home about a week prior to admission, history of chronic respiratory failure on 4 L of oxygen continuous history of chronic diastolic dysfunction CHF, history of A-fib status post DCCV, history of interstitial lung disease, pulmonary hypertension who was brought into the ER by EMS for evaluation of dizziness which she states that she has chronically but this episode is different.  She states that over the last several days she gets dizzy with any form of movement and her gait is very unsteady despite the use of an assist device.  She is having to hold onto her daughter to prevent her from falling.  She complains of a lot of postnasal drip as well as noises in her ears.  She complains of feeling very weak and tired. ? ?Assessment and Plan: ?* Orthostatic hypotension ?--Per PT eval on 3/2, BP dropped to 60's-70's when standing ?--Pt said this is a known problem, and takes midodrine to increase BP ?--Hold torsemide and aldactone held since 3/2, midodrine increased on 3/3. ?--cardiology consulted ?Plan: ?--Hold home torsemide and aldactone for now ?--cont midodrine at increased 10 mg TID ?--cont TED hose and abdominal binder ? ? ?Anemia due to chronic kidney disease ?--baseline Hgb 7-8 ?--1u pRBC on 3/3 for Hgb 7.9 in the hopes of improving her symptoms of weakness ?Plan: ?--monitor for Hgb, with goal of 8. ? ?Stage 3b chronic kidney disease (CKD) ([place]) ?Cr stable and at baseline. ? ?Endocarditis ?Patient recently treated for endocarditis/infection due to implanted cardiac device. ?Recently completed IV Cefazolin through 08/08/21 and currently of Cefadroxil  500 mg BID. ?Patient is currently on IV Rocephin for her pansinusitis and will need to be restarted on her Cefadroxil upon discharge. ?--cont ceftriaxone for now ? ?History of COVID-19 ?Patient had COVID-19 viral infection in January while in subacute rehab ?Her COVID-19 PCR test is positive during this hospitalization ?COVID-19 cycle threshold is 38 and patient is no longer considered infectious ?No indication for isolation at this time ? ?Generalized weakness ?Secondary to chronic illness and acutely worsened by dizziness and inability to get out of bed ?Place patient on fall precautions ?PT evaluation ? ?Chronic diastolic CHF (congestive heart failure) ([place]) ?Patient has a history of chronic diastolic dysfunction CHF ?--takes torsemide 60 mg twice daily as well as spironolactone at home.  Likely dehydrated, since BUN elevated from baseline.  Both held after morning dose on 3/2. ?Plan: ?--cont to hold torsemide and aldactone ? ?Obesity (BMI 30-39.9) ?Complicates overall prognosis and care ?Lifestyle modification and exercise has been discussed with patient in detail ? ?Pansinusitis ?Patient presents for evaluation of vertigo and is found to have pansinusitis on CT head ?--started on ceftriaxone  ? ?Atrial fibrillation with tachy brady syndrome  ?History of paroxysmal A-fib ?Continue amiodarone and digoxin for rate control ?Continue Eliquis as primary prophylaxis for an acute stroke ? ?Interstitial lung disease ([place]) ?With chronic hypoxemic respiratory failure ?Associated with pulmonary hypertension ?--Unfortunately few options for her atrial fibrillation, will continue amiodarone, per cardio ?--Continue home sildenafil,selexipag and macitentan ?--Continue supplemental O2 to keep sats between 88-92% ? ? ? ? ? ? ? ? ?Subjective:  ?Pt reported feeling better today.  Orthostatic BP  result neg today. ? ? ?Physical Exam: ?Vitals:  ? 08/24/21 0500 08/24/21 0739 08/24/21 1116 08/24/21 1437  ?BP:  (!) 121/50 (!) 133/57    ?Pulse:  60 (!) 59   ?Resp:  19 18   ?Temp:  97.9 ?F (36.6 ?C) 97.8 ?F (36.6 ?C)   ?TempSrc:      ?SpO2:  95% 97% 98%  ?Weight: 78.6 kg     ?Height:      ? ? ?Constitutional: NAD, AAOx3, sitting in recliner ?HEENT: conjunctivae and lids normal, EOMI ?CV: No cyanosis.   ?RESP: normal respiratory effort, on Max ?Extremities: ted hose on, LLE larger than RLE (chronic) ?SKIN: warm, dry ?Neuro: II - XII grossly intact.   ?Psych: better mood and affect.  Appropriate judgement and reason ? ? ?Data Reviewed: ? ?Family Communication:  ? ?Disposition: ?Status is: Inpatient ?Remains inpatient appropriate because: holding home diuretic now due to orthostasis, need cardiology to clear ? ? Planned Discharge Destination: home with [facility] ? ? ? ? ?Time spent: 35 minutes ? ?Author: ?Enzo Bi, MD ?08/24/2021 4:30 PM ? ?For on call review [email or web address].  ? ?

## 2021-08-24 NOTE — Progress Notes (Signed)
Mobility Specialist - Progress Note ? ? ? 08/24/21 1500  ?Mobility  ?Activity Transferred from chair to bed  ?Level of Assistance Standby assist, set-up cues, supervision of patient - no hands on  ?Assistive Device None  ?Distance Ambulated (ft) 2 ft  ?Activity Response Tolerated well  ?$Mobility charge 1 Mobility  ? ? ?Pt transferred chair-bed ? ? ?Kathee Delton ?Mobility Specialist ?08/24/21, 3:23 PM ? ? ? ? ? ?

## 2021-08-25 DIAGNOSIS — I951 Orthostatic hypotension: Secondary | ICD-10-CM | POA: Diagnosis not present

## 2021-08-25 LAB — BASIC METABOLIC PANEL
Anion gap: 4 — ABNORMAL LOW (ref 5–15)
BUN: 28 mg/dL — ABNORMAL HIGH (ref 8–23)
CO2: 30 mmol/L (ref 22–32)
Calcium: 8.8 mg/dL — ABNORMAL LOW (ref 8.9–10.3)
Chloride: 97 mmol/L — ABNORMAL LOW (ref 98–111)
Creatinine, Ser: 1.15 mg/dL — ABNORMAL HIGH (ref 0.44–1.00)
GFR, Estimated: 49 mL/min — ABNORMAL LOW (ref >60)
Glucose, Bld: 121 mg/dL — ABNORMAL HIGH (ref 70–99)
Potassium: 4.1 mmol/L (ref 3.5–5.1)
Sodium: 131 mmol/L — ABNORMAL LOW (ref 135–145)

## 2021-08-25 LAB — CBC
HCT: 29.1 % — ABNORMAL LOW (ref 36.0–46.0)
Hemoglobin: 9 g/dL — ABNORMAL LOW (ref 12.0–15.0)
MCH: 26.2 pg (ref 26.0–34.0)
MCHC: 30.9 g/dL (ref 30.0–36.0)
MCV: 84.8 fL (ref 80.0–100.0)
Platelets: 257 10*3/uL (ref 150–400)
RBC: 3.43 MIL/uL — ABNORMAL LOW (ref 3.87–5.11)
RDW: 14.3 % (ref 11.5–15.5)
WBC: 7.1 10*3/uL (ref 4.0–10.5)
nRBC: 0 % (ref 0.0–0.2)

## 2021-08-25 LAB — MAGNESIUM: Magnesium: 2.2 mg/dL (ref 1.7–2.4)

## 2021-08-25 NOTE — Progress Notes (Signed)
Progress Note  Patient Name: Debra Saunders Date of Encounter: 08/25/2021  [facility] Cardiologist: [name], MD   Subjective   Continuing to get dizzy when he stands.  Otherwise without complaint.  Inpatient Medications    Scheduled Meds:  sodium chloride   Intravenous Once   amiodarone  200 mg Oral Daily   apixaban  5 mg Oral BID   Difluprednate  1 drop Ophthalmic BID   digoxin  0.125 mg Oral Daily   DULoxetine  60 mg Oral Daily   fluticasone  2 spray Each Nare Daily   gabapentin  600 mg Oral TID   macitentan  10 mg Oral Daily   midodrine  10 mg Oral TID WC   rosuvastatin  10 mg Oral Daily   Selexipag  1,600 mcg Oral BID   sildenafil  20 mg Oral TID   sodium chloride flush  3 mL Intravenous Q12H   Continuous Infusions:  sodium chloride     cefTRIAXone (ROCEPHIN)  IV 2 g (08/24/21 2048)   PRN Meds: sodium chloride, acetaminophen **OR** acetaminophen, albuterol, meclizine, ondansetron **OR** ondansetron (ZOFRAN) IV, sodium chloride flush   Vital Signs    Vitals:   08/25/21 0500 08/25/21 0500 08/25/21 0906 08/25/21 1143  BP:  (!) 125/47 (!) 118/53 (!) 149/63  Pulse:  (!) 58 60 (!) 59  Resp:    20  Temp:  98.7 F (37.1 C) 98.4 F (36.9 C) 98.7 F (37.1 C)  TempSrc:      SpO2:  99% 97% 97%  Weight: 81 kg     Height:        Intake/Output Summary (Last 24 hours) at 08/25/2021 1211 Last data filed at 08/24/2021 2300 Gross per 24 hour  Intake 370 ml  Output 600 ml  Net -230 ml    Last 3 Weights 08/25/2021 08/24/2021 08/21/2021  Weight (lbs) 178 lb 9.2 oz 173 lb 4.5 oz 181 lb 3.5 oz  Weight (kg) 81 kg 78.6 kg 82.2 kg      Telemetry    AV paced- Personally Reviewed  ECG    No new - Personally Reviewed  Physical Exam   GEN: Well nourished, well developed, in no acute distress  HEENT: normal  Neck: no JVD, carotid bruits, or masses Cardiac: RRR; no murmurs, rubs, or gallops,no edema  Respiratory:  clear to auscultation bilaterally, normal work  of breathing GI: soft, nontender, nondistended, + BS MS: no deformity or atrophy  Skin: warm and dry, device site well healed Neuro:  Strength and sensation are intact Psych: euthymic mood, full affect   Labs    High Sensitivity Troponin:   Recent Labs  Lab 08/21/21 1337 08/21/21 1622  TROPONINIHS 15 14      Chemistry Recent Labs  Lab 08/23/21 0519 08/24/21 0558 08/25/21 0434  NA 134* 137 131*  K 4.4 4.0 4.1  CL 95* 99 97*  CO2 '29 28 30  '$ GLUCOSE 148* 123* 121*  BUN 34* 33* 28*  CREATININE 1.47* 1.39* 1.15*  CALCIUM 8.9 8.8* 8.8*  MG 1.8 2.1 2.2  GFRNONAA 36* 39* 49*  ANIONGAP 10 10 4*     Lipids No results for input(s): CHOL, TRIG, HDL, LABVLDL, LDLCALC, CHOLHDL in the last 168 hours.  Hematology Recent Labs  Lab 08/23/21 0519 08/24/21 0558 08/25/21 0434  WBC 6.7 6.4 7.1  RBC 3.09* 3.50* 3.43*  HGB 7.9* 9.1* 9.0*  HCT 26.4* 29.5* 29.1*  MCV 85.4 84.3 84.8  MCH 25.6* 26.0 26.2  MCHC 29.9* 30.8 30.9  RDW 14.3 14.0 14.3  PLT 210 230 257    Thyroid No results for input(s): TSH, FREET4 in the last 168 hours.  BNP Recent Labs  Lab 08/21/21 1444  BNP 45.7     DDimer No results for input(s): DDIMER in the last 168 hours.   Radiology    ECHOCARDIOGRAM COMPLETE  Result Date: 08/23/2021    ECHOCARDIOGRAM REPORT   Patient Name:   [name] Date of Exam: 08/23/2021 Medical Rec #:  [id number]        Height:       60.0 in Accession #:    [id number]       Weight:       181.2 lb Date of Birth:  1946/02/17         BSA:          1.790 m Patient Age:    46 years         BP:           105/54 mmHg Patient Gender: F                HR:           60 bpm. Exam Location:  [facility] Procedure: 2D Echo, Cardiac Doppler and Color Doppler Indications:     Endocarditis I38  History:         Patient has prior history of Echocardiogram examinations, most                  recent 07/04/2021. Pacemaker, Arrythmias:Atrial Fibrillation,                  Signs/Symptoms:Chest Pain; Risk  Factors:Hypertension.  Sonographer:     Sherrie Sport Referring Phys:  Pacheco Diagnosing Phys: Ida Rogue MD  Sonographer Comments: Suboptimal apical window. IMPRESSIONS  1. Left ventricular ejection fraction, by estimation, is 60 to 65%. The left ventricle has normal function. The left ventricle has no regional wall motion abnormalities. There is mild left ventricular hypertrophy. Left ventricular diastolic parameters are consistent with Grade I diastolic dysfunction (impaired relaxation).  2. Right ventricular systolic function is normal. The right ventricular size is normal. There is mildly elevated pulmonary artery systolic pressure. The estimated right ventricular systolic pressure is 78.4 mmHg.  3. Left atrial size was moderately dilated.  4. The mitral valve is normal in structure. No evidence of mitral valve regurgitation. No evidence of mitral stenosis.  5. Tricuspid valve regurgitation is moderate.  6. The aortic valve is normal in structure. Aortic valve regurgitation is not visualized. Aortic valve sclerosis/calcification is present, without any evidence of aortic stenosis.  7. The inferior vena cava is normal in size with greater than 50% respiratory variability, suggesting right atrial pressure of 3 mmHg.  8. RV pacer wire difficult to visualize, grossly no vegetation noted. FINDINGS  Left Ventricle: Left ventricular ejection fraction, by estimation, is 60 to 65%. The left ventricle has normal function. The left ventricle has no regional wall motion abnormalities. The left ventricular internal cavity size was normal in size. There is  mild left ventricular hypertrophy. Left ventricular diastolic parameters are consistent with Grade I diastolic dysfunction (impaired relaxation). Right Ventricle: The right ventricular size is normal. No increase in right ventricular wall thickness. Right ventricular systolic function is normal. There is mildly elevated pulmonary artery systolic pressure.  The tricuspid regurgitant velocity is 2.97  m/s, and with an assumed right atrial pressure of 5 mmHg, the  estimated right ventricular systolic pressure is 68.1 mmHg. Left Atrium: Left atrial size was moderately dilated. Right Atrium: Right atrial size was normal in size. Pericardium: There is no evidence of pericardial effusion. Mitral Valve: The mitral valve is normal in structure. No evidence of mitral valve regurgitation. No evidence of mitral valve stenosis. MV peak gradient, 3.7 mmHg. The mean mitral valve gradient is 1.0 mmHg. Tricuspid Valve: The tricuspid valve is normal in structure. Tricuspid valve regurgitation is moderate . No evidence of tricuspid stenosis. Aortic Valve: The aortic valve is normal in structure. Aortic valve regurgitation is not visualized. Aortic valve sclerosis/calcification is present, without any evidence of aortic stenosis. Aortic valve mean gradient measures 5.0 mmHg. Aortic valve peak  gradient measures 8.2 mmHg. Aortic valve area, by VTI measures 2.80 cm. Pulmonic Valve: The pulmonic valve was normal in structure. Pulmonic valve regurgitation is mild. No evidence of pulmonic stenosis. Aorta: The aortic root is normal in size and structure. Venous: The inferior vena cava is normal in size with greater than 50% respiratory variability, suggesting right atrial pressure of 3 mmHg. IAS/Shunts: No atrial level shunt detected by color flow Doppler. Additional Comments: A device lead is visualized.  LEFT VENTRICLE PLAX 2D LVIDd:         4.00 cm   Diastology LVIDs:         2.20 cm   LV e' medial:    4.90 cm/s LV PW:         1.30 cm   LV E/e' medial:  13.0 LV IVS:        1.30 cm   LV e' lateral:   4.13 cm/s LVOT diam:     2.00 cm   LV E/e' lateral: 15.4 LV SV:         63 LV SV Index:   35 LVOT Area:     3.14 cm  RIGHT VENTRICLE RV S prime:     19.50 cm/s TAPSE (M-mode): 2.7 cm LEFT ATRIUM             Index        RIGHT ATRIUM           Index LA diam:        3.20 cm 1.79 cm/m   RA Area:      15.50 cm LA Vol (A2C):   98.9 ml 55.25 ml/m  RA Volume:   30.80 ml  17.21 ml/m LA Vol (A4C):   66.3 ml 37.04 ml/m LA Biplane Vol: 80.6 ml 45.03 ml/m  AORTIC VALVE                    PULMONIC VALVE AV Area (Vmax):    2.46 cm     PV Vmax:        0.90 m/s AV Area (Vmean):   2.32 cm     PV Vmean:       53.100 cm/s AV Area (VTI):     2.80 cm     PV VTI:         0.128 m AV Vmax:           143.00 cm/s  PV Peak grad:   3.2 mmHg AV Vmean:          99.500 cm/s  PV Mean grad:   1.0 mmHg AV VTI:            0.223 m      RVOT Peak grad: 9 mmHg AV Peak Grad:  8.2 mmHg AV Mean Grad:      5.0 mmHg LVOT Vmax:         112.00 cm/s LVOT Vmean:        73.500 cm/s LVOT VTI:          0.199 m LVOT/AV VTI ratio: 0.89  AORTA Ao Root diam: 3.20 cm MITRAL VALVE               TRICUSPID VALVE MV Area (PHT): 1.82 cm    TR Peak grad:   35.3 mmHg MV Area VTI:   1.98 cm    TR Vmax:        297.00 cm/s MV Peak grad:  3.7 mmHg MV Mean grad:  1.0 mmHg    SHUNTS MV Vmax:       0.96 m/s    Systemic VTI:  0.20 m MV Vmean:      49.8 cm/s   Systemic Diam: 2.00 cm MV Decel Time: 416 msec    Pulmonic VTI:  0.208 m MV E velocity: 63.80 cm/s MV A velocity: 86.50 cm/s MV E/A ratio:  0.74 Ida Rogue MD Electronically signed by Ida Rogue MD Signature Date/Time: 08/23/2021/2:28:52 PM    Final     Cardiac Studies   Echo 08/23/21   1. Left ventricular ejection fraction, by estimation, is 60 to 65%. The  left ventricle has normal function. The left ventricle has no regional  wall motion abnormalities. There is mild left ventricular hypertrophy.  Left ventricular diastolic parameters  are consistent with Grade I diastolic dysfunction (impaired relaxation).   2. Right ventricular systolic function is normal. The right ventricular  size is normal. There is mildly elevated pulmonary artery systolic  pressure. The estimated right ventricular systolic pressure is 04.5 mmHg.   3. Left atrial size was moderately dilated.   4. The mitral  valve is normal in structure. No evidence of mitral valve  regurgitation. No evidence of mitral stenosis.   5. Tricuspid valve regurgitation is moderate.   6. The aortic valve is normal in structure. Aortic valve regurgitation is  not visualized. Aortic valve sclerosis/calcification is present, without  any evidence of aortic stenosis.   7. The inferior vena cava is normal in size with greater than 50%  respiratory variability, suggesting right atrial pressure of 3 mmHg.   8. RV pacer wire difficult to visualize, grossly no vegetation noted.   TEE July 04, 2021   1. Left ventricular ejection fraction, by estimation, is 60 to 65%. The  left ventricle has normal function. There is mild left ventricular  hypertrophy.   2. Peak RV-RA gradient 30 mmHg. There is a 1.6 x 1 cm mobile vegetation  attached to the pacemaker in the RA. Right ventricular systolic function  is normal. The right ventricular size is mildly enlarged.   3. Left atrial size was mildly dilated. No left atrial/left atrial  appendage thrombus was detected.   4. Right atrial size was moderately dilated.   5. The mitral valve is normal in structure. Mild to moderate mitral valve  regurgitation. No evidence of mitral stenosis.   6. There is mobile vegetation on the tricuspid valve. The tricuspid valve  is abnormal. Tricuspid valve regurgitation is moderate.   7. The aortic valve is tricuspid. Aortic valve regurgitation is not  visualized. Aortic valve sclerosis/calcification is present, without any  evidence of aortic stenosis.   8. Grade 3 plaque in the descending thoracic aorta.   9. No PFO or ASD by color doppler.  [place]y 08/2020 1. Low/normal filling pressures.  2. Very mild pulmonary hypertension.  3. Preserved cardiac output.    Given ongoing dyspnea, needs pulmonary evaluation.   Patient Profile     76 y.o. 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  Holding diuresis.  Echo with a normal ejection fraction and mildly elevated pulmonary artery pressure.  Currently on midodrine 10 mg 3 times daily with compression stockings.  I have discussed with [employer] who Eugen Jeansonne turn on minute ventilation tomorrow which may help with her symptoms.  Pulmonary hypertension: Mildly elevated.  Continue current medications.  Has follow-up in heart failure clinic.  Paroxysmal atrial fibrillation: Remains in sinus rhythm.  Continue amiodarone, digoxin, Eliquis.  ILD: Currently on oxygen 4 to 5 L.  Currently on amiodarone for atrial fibrillation.   For questions or updates, please contact [place] Please consult [email or web address] for contact info under        Signed, Atlee Kluth Meredith Leeds, MD  08/25/2021, 12:11 PM

## 2021-08-25 NOTE — Progress Notes (Signed)
?Progress Note ? ? ?Patient: Debra Saunders:[id number] DOB: 1946-04-28 DOA: 08/21/2021     4 ?DOS: the patient was seen and examined on 08/25/2021 ?  ?Brief hospital course: ?Debra Saunders is a 76 y.o. female with medical history significant for recent endocarditis, pacemaker site/wire infection discharged to subacute rehab for IV antibiotic therapy and returned home about a week prior to admission, history of chronic respiratory failure on 4 L of oxygen continuous history of chronic diastolic dysfunction CHF, history of A-fib status post DCCV, history of interstitial lung disease, pulmonary hypertension who was brought into the ER by EMS for evaluation of dizziness which she states that she has chronically but this episode is different.  She states that over the last several days she gets dizzy with any form of movement and her gait is very unsteady despite the use of an assist device.  She is having to hold onto her daughter to prevent her from falling.  She complains of a lot of postnasal drip as well as noises in her ears.  She complains of feeling very weak and tired. ? ?Assessment and Plan: ?* Orthostatic hypotension ?--Per PT eval on 3/2, BP dropped to 60's-70's when standing.  Pt is very symptomatic with severe dizziness and near syncope with standing. ?--Pt said this is a known problem, and takes midodrine to increase BP ?--Hold torsemide and aldactone held since 3/2, midodrine increased on 3/3. ?--cardiology consulted ?Plan: ?--Hold home torsemide and aldactone for now ?--cont midodrine at increased 10 mg TID ?--cont TED hose and abdominal binder ?--frequent mobility  ? ? ?Anemia due to chronic kidney disease ?--baseline Hgb 7-8 ?--1u pRBC on 3/3 for Hgb 7.9 in the hopes of improving her symptoms of weakness ?Plan: ?--monitor for Hgb, with goal of 8. ? ?Stage 3b chronic kidney disease (CKD) ([place]) ?Cr stable and at baseline. ? ?Endocarditis ?Patient recently treated for endocarditis/infection due to  implanted cardiac device. ?Recently completed IV Cefazolin through 08/08/21 and currently of Cefadroxil 500 mg BID. ?Patient is currently on IV Rocephin for her pansinusitis and will need to be restarted on her Cefadroxil upon discharge. ?--cont ceftriaxone for now ? ?History of COVID-19 ?Patient had COVID-19 viral infection in January while in subacute rehab ?Her COVID-19 PCR test is positive during this hospitalization ?COVID-19 cycle threshold is 38 and patient is no longer considered infectious ?No indication for isolation at this time ? ?Generalized weakness ?Secondary to chronic illness and acutely worsened by dizziness and inability to get out of bed ?Place patient on fall precautions ?PT evaluation ? ?Chronic diastolic CHF (congestive heart failure) ([place]) ?Patient has a history of chronic diastolic dysfunction CHF ?--takes torsemide 60 mg twice daily as well as spironolactone at home.  Likely dehydrated, since BUN elevated from baseline.  Both held after morning dose on 3/2. ?Plan: ?--cont to hold torsemide and aldactone ? ?Obesity (BMI 30-39.9) ?Complicates overall prognosis and care ?Lifestyle modification and exercise has been discussed with patient in detail ? ?Pansinusitis ?Patient presents for evaluation of vertigo and is found to have pansinusitis on CT head ?--started on ceftriaxone  ? ?Atrial fibrillation with tachy brady syndrome  ?History of paroxysmal A-fib ?Continue amiodarone and digoxin for rate control ?Continue Eliquis as primary prophylaxis for an acute stroke ? ?Interstitial lung disease ([place]) ?With chronic hypoxemic respiratory failure ?Associated with pulmonary hypertension ?--Unfortunately few options for her atrial fibrillation, will continue amiodarone, per cardio ?--Continue home sildenafil,selexipag and macitentan ?--Continue supplemental O2 to keep sats between 88-92% ? ? ? ? ? ? ? ? ?  Subjective:  ?Pt's BP was higher, and orthostatic dropped only to 95 systolic, but still complained  of severe dizziness.   ? ? ?Physical Exam: ?Vitals:  ? 08/25/21 0500 08/25/21 0500 08/25/21 0906 08/25/21 1143  ?BP:  (!) 125/47 (!) 118/53 (!) 149/63  ?Pulse:  (!) 58 60 (!) 59  ?Resp:    20  ?Temp:  98.7 ?F (37.1 ?C) 98.4 ?F (36.9 ?C) 98.7 ?F (37.1 ?C)  ?TempSrc:      ?SpO2:  99% 97% 97%  ?Weight: 81 kg     ?Height:      ? ? ?Constitutional: NAD, AAOx3 ?HEENT: conjunctivae and lids normal, EOMI ?CV: No cyanosis.   ?RESP: normal respiratory effort, on 4L ?Extremities: ted hoses on, LLE large (chronic) ?SKIN: warm, dry ?Neuro: II - XII grossly intact.   ?Psych: Normal mood and affect.  Appropriate judgement and reason ? ? ?Data Reviewed: ? ?Family Communication:  ? ?Disposition: ?Status is: Inpatient ?Remains inpatient appropriate because: holding home diuretic now due to orthostasis, need cardiology to clear ? ? Planned Discharge Destination: home with [facility] ? ? ? ? ?Time spent: 35 minutes ? ?Author: ?Enzo Bi, MD ?08/25/2021 3:23 PM ? ?For on call review [email or web address].  ? ?

## 2021-08-25 NOTE — Progress Notes (Signed)
+   orthostatic VS/ pt reports dizziness when standing/ MD made aware   ?

## 2021-08-26 DIAGNOSIS — R42 Dizziness and giddiness: Secondary | ICD-10-CM | POA: Diagnosis not present

## 2021-08-26 DIAGNOSIS — I951 Orthostatic hypotension: Secondary | ICD-10-CM | POA: Diagnosis not present

## 2021-08-26 LAB — CBC
HCT: 29.6 % — ABNORMAL LOW (ref 36.0–46.0)
Hemoglobin: 9 g/dL — ABNORMAL LOW (ref 12.0–15.0)
MCH: 26.2 pg (ref 26.0–34.0)
MCHC: 30.4 g/dL (ref 30.0–36.0)
MCV: 86 fL (ref 80.0–100.0)
Platelets: 298 10*3/uL (ref 150–400)
RBC: 3.44 MIL/uL — ABNORMAL LOW (ref 3.87–5.11)
RDW: 14.4 % (ref 11.5–15.5)
WBC: 7.7 10*3/uL (ref 4.0–10.5)
nRBC: 0 % (ref 0.0–0.2)

## 2021-08-26 LAB — BASIC METABOLIC PANEL
Anion gap: 8 (ref 5–15)
BUN: 25 mg/dL — ABNORMAL HIGH (ref 8–23)
CO2: 28 mmol/L (ref 22–32)
Calcium: 8.7 mg/dL — ABNORMAL LOW (ref 8.9–10.3)
Chloride: 99 mmol/L (ref 98–111)
Creatinine, Ser: 1.22 mg/dL — ABNORMAL HIGH (ref 0.44–1.00)
GFR, Estimated: 45 mL/min — ABNORMAL LOW (ref >60)
Glucose, Bld: 108 mg/dL — ABNORMAL HIGH (ref 70–99)
Potassium: 4.2 mmol/L (ref 3.5–5.1)
Sodium: 135 mmol/L (ref 135–145)

## 2021-08-26 LAB — CULTURE, BLOOD (ROUTINE X 2)
Culture: NO GROWTH
Culture: NO GROWTH

## 2021-08-26 LAB — MRSA NEXT GEN BY PCR, NASAL: MRSA by PCR Next Gen: NOT DETECTED

## 2021-08-26 LAB — DIGOXIN LEVEL: Digoxin Level: 1.5 ng/mL (ref 0.8–2.0)

## 2021-08-26 LAB — MAGNESIUM: Magnesium: 2.3 mg/dL (ref 1.7–2.4)

## 2021-08-26 LAB — GLUCOSE, CAPILLARY: Glucose-Capillary: 150 mg/dL — ABNORMAL HIGH (ref 70–99)

## 2021-08-26 MED ORDER — CEFADROXIL 500 MG PO CAPS
500.0000 mg | ORAL_CAPSULE | Freq: Two times a day (BID) | ORAL | Status: DC
Start: 2021-08-26 — End: 2021-08-27
  Administered 2021-08-26 – 2021-08-27 (×2): 500 mg via ORAL
  Filled 2021-08-26 (×3): qty 1

## 2021-08-26 NOTE — Assessment & Plan Note (Signed)
--  only upon standing.  Thought to be due to severe orthostasis, however, now that systolic maintained in 641 with standing, pt still complained of severe dizziness. ?--Discussed with oncall ENT Dr. Kathyrn Sheriff, who rec outpatient clinic f/u. ?Plan: ?--cont to manage orthostasis ?--frequent mobility ?--outpatient f/u with ENT ?

## 2021-08-26 NOTE — Care Management Important Message (Signed)
Important Message ? ?Patient Details  ?Name: Debra Saunders ?MRN: [id number] ?Date of Birth: 1945/06/27 ? ? ?Medicare Important Message Given:  Yes ? ? ? ? ?[name] ?08/26/2021, 12:05 PM ?

## 2021-08-26 NOTE — Progress Notes (Signed)
PT Cancellation Note ? ?Patient Details ?Name: Debra Saunders ?MRN: [id number] ?DOB: 16-May-1946 ? ? ?Cancelled Treatment:    Reason Eval/Treat Not Completed: Other (comment). Pt currently eating lunch, PT to re-attempt as able. ? ? ?Lieutenant Diego PT, DPT ?1:24 PM,08/26/21 ? ?

## 2021-08-26 NOTE — Progress Notes (Signed)
orthostatic VS this morning remain + / but improved/ pt continues to have sever dizziness when standing / requiring assistance to balance / pt closing eyes and grabbing her head when she stood to help relieve her dizziness/ improved when she sat down /MD aware / will continue to monitor ?

## 2021-08-26 NOTE — Progress Notes (Signed)
Physical Therapy Treatment ?Patient Details ?Name: Debra Saunders ?MRN: [id number] ?DOB: 06/11/1946 ?Today's Date: 08/26/2021 ? ? ?History of Present Illness The patient is a 76 yo female that presented to the ED for chief complaint of dizziness, gait instability. Recently admitted for endocarditis, pacemaker site/wire infection, transferred to [facility]. Discharged to subacute rehab for IV antibiotic therapy and returned home about a week prior to this admission. Workup positive for SARS coronavirus 2, as well as acute pansinusitis as well as extensive opacification in the bilateral mastoid air cells. PMH includes HTN, afib, pacemaker, PAH, chronic resp failure (on home O2), CHF, cardiorenal syndrome, DM2, OSA, stroke, ICH, chronic back pain, arthritis. ? ?  ?PT Comments  ? ? Pt screened in depth for BPPV this session. Agreeable to mobility and attempt, and verbalized understanding of education. She was able to perform sit <> stand and step pivot with CGA, and returned to supine with supervision. L and R dix hallpike performed, pt with some complaints of dizziness but no nystagmus noted. Pt reported worse symptoms is when she came back up into sitting. At this time pt testing/assessment does not indicate BPPV. Pt in bed with all needs in reach, current recommendation remains appropriate. ? ?Screening questions: Pt complained of diplopia as well as  ?aural fullness, ear pain.  ? ?Subjective history of current problem: sudden onset, pt has had dizziness in the past, but not like this. ? ?exacerbating factors: changing position, especially when coming up into standing ? ?Relieving factors: laying or sitting down ? ?Symptom duration: minutes, throughout entirely standing  ? ?Symptom frequency: every time she stands up ? ?Description of symptoms, description of dizziness: Pt reports feeling faint, like she's going to pass out. Orthostatics +. ? ? ?BPPV TESTS: ? Symptoms Duration Intensity Nystagmus  ?Left Dix-Hallpike  Reported some dizziness 45sec 4/10 none  ?Right Dix-Hallpike Reported some dizziness, >L 45 sec 4/10 non  ?      ?      ? ?   ?Recommendations for follow up therapy are one component of a multi-disciplinary discharge planning process, led by the attending physician.  Recommendations may be updated based on patient status, additional functional criteria and insurance authorization. ? ?Follow Up Recommendations ? Home health PT ?  ?  ?Assistance Recommended at Discharge Frequent or constant Supervision/Assistance  ?Patient can return home with the following A little help with walking and/or transfers;A little help with bathing/dressing/bathroom;Assistance with cooking/housework;Assist for transportation;Help with stairs or ramp for entrance ?  ?Equipment Recommendations ? Rolling walker (2 wheels)  ?  ?Recommendations for Other Services   ? ? ?  ?Precautions / Restrictions Precautions ?Precautions: Fall ?Restrictions ?Weight Bearing Restrictions: No  ?  ? ?Mobility ? Bed Mobility ?Overal bed mobility: Needs Assistance ?Bed Mobility: Sit to Supine ?  ?  ?Supine to sit: Supervision ?Sit to supine: Supervision ?  ?  ?  ? ?Transfers ?Overall transfer level: Needs assistance ?Equipment used: Rolling walker (2 wheels) ?Transfers: Bed to chair/wheelchair/BSC ?Sit to Stand: Min guard ?  ?  ?  ?  ?  ?General transfer comment: pt dizzy throughout ?  ? ?Ambulation/Gait ?  ?  ?  ?  ?  ?  ?  ?  ? ? ?Stairs ?  ?  ?  ?  ?  ? ? ?Wheelchair Mobility ?  ? ?Modified Rankin (Stroke Patients Only) ?  ? ? ?  ?Balance Overall balance assessment: Needs assistance ?Sitting-balance support: Bilateral upper extremity supported, Feet supported ?Sitting  balance-Leahy Scale: Good ?  ?  ?Standing balance support: Bilateral upper extremity supported, During functional activity, Reliant on assistive device for balance ?Standing balance-Leahy Scale: Fair ?  ?  ?  ?  ?  ?  ?  ?  ?  ?  ?  ?  ?  ? ?  ?Cognition Arousal/Alertness: Awake/alert ?Behavior  During Therapy: [facility] for tasks assessed/performed ?Overall Cognitive Status: Within Functional Limits for tasks assessed ?  ?  ?  ?  ?  ?  ?  ?  ?  ?  ?  ?  ?  ?  ?  ?  ?  ?  ?  ? ?  ?Exercises Other Exercises ?Other Exercises: L and R dix hallpike performed. pt with complaints of some dizziness with L and R, worse on the R. no nystagmus noted however, and pt reported worsening symptoms when returning to long sitting. ? ?  ?General Comments   ?  ?  ? ?Pertinent Vitals/Pain Pain Assessment ?Pain Assessment: No/denies pain  ? ? ?Home Living   ?  ?  ?  ?  ?  ?  ?  ?  ?  ?   ?  ?Prior Function    ?  ?  ?   ? ?PT Goals (current goals can now be found in the care plan section) Progress towards PT goals: Progressing toward goals ? ?  ?Frequency ? ? ? Min 2X/week ? ? ? ?  ?PT Plan Current plan remains appropriate  ? ? ?Co-evaluation   ?  ?  ?  ?  ? ?  ?AM-PAC PT "6 Clicks" Mobility   ?Outcome Measure ? Help needed turning from your back to your side while in a flat bed without using bedrails?: A Little ?Help needed moving from lying on your back to sitting on the side of a flat bed without using bedrails?: A Little ?Help needed moving to and from a bed to a chair (including a wheelchair)?: A Little ?Help needed standing up from a chair using your arms (e.g., wheelchair or bedside chair)?: A Little ?Help needed to walk in hospital room?: A Little ?Help needed climbing 3-5 steps with a railing? : A Lot ?6 Click Score: 17 ? ?  ?End of Session   ?Activity Tolerance: Patient tolerated treatment well ?Patient left: in bed;with call bell/phone within reach;with bed alarm set ?Nurse Communication: Mobility status ?PT Visit Diagnosis: Unsteadiness on feet (R26.81);Muscle weakness (generalized) (M62.81);Other abnormalities of gait and mobility (R26.89) ?  ? ? ?Time: 1430-1450 ?PT Time Calculation (min) (ACUTE ONLY): 20 min ? ?Charges:  $Therapeutic Activity: 8-22 mins          ?          ? ?Lieutenant Diego PT, DPT ?3:28  PM,08/26/21 ? ? ?

## 2021-08-26 NOTE — Progress Notes (Signed)
?Progress Note ? ? ?Patient: Debra Saunders NOM:[id number] DOB: [birth year] DOA: 08/21/2021     5 ?DOS: the patient was seen and examined on 08/26/2021 ?  ?Brief hospital course: ?Debra Saunders is a 76 y.o. female with medical history significant for recent endocarditis, pacemaker site/wire infection discharged to subacute rehab for IV antibiotic therapy and returned home about a week prior to admission, history of chronic respiratory failure on 4 L of oxygen continuous history of chronic diastolic dysfunction CHF, history of A-fib status post DCCV, history of interstitial lung disease, pulmonary hypertension who was brought into the ER by EMS for evaluation of dizziness which she states that she has chronically but this episode is different.  She states that over the last several days she gets dizzy with any form of movement and her gait is very unsteady despite the use of an assist device.  She is having to hold onto her daughter to prevent her from falling.  She complains of a lot of postnasal drip as well as noises in her ears.  She complains of feeling very weak and tired. ? ?Assessment and Plan: ?* Severe dizziness ?--only upon standing.  Thought to be due to severe orthostasis, however, now that systolic maintained in 962 with standing, pt still complained of severe dizziness. ?--Discussed with oncall ENT Dr. Kathyrn Saunders, who rec outpatient clinic f/u. ?Plan: ?--cont to manage orthostasis ?--frequent mobility ?--outpatient f/u with ENT ? ?Orthostatic hypotension ?--Per PT eval on 3/2, BP dropped to 60's-70's when standing.  Pt is very symptomatic with severe dizziness and near syncope with standing. ?--Pt said this is a known problem, and takes midodrine to increase BP ?--Hold torsemide and aldactone held since 3/2, midodrine increased on 3/3. ?--cardiology consulted ?Plan: ?--Hold home torsemide and aldactone for now ?--cont midodrine at increased 10 mg TID ?--cont TED hose and abdominal binder ?--frequent  mobility  ? ? ?Anemia due to chronic kidney disease ?--baseline Hgb 7-8 ?--1u pRBC on 3/3 for Hgb 7.9 in the hopes of improving her symptoms of weakness ?Plan: ?--monitor for Hgb, with goal of 8. ? ?Stage 3b chronic kidney disease (CKD) ([place]) ?Cr stable and at baseline. ? ?History of endocarditis ?Patient recently treated for endocarditis/infection due to implanted cardiac device. ?Recently completed IV Cefazolin through 08/08/21 and currently of Cefadroxil 500 mg BID. ?Patient was put on IV Rocephin for her pansinusitis ?--d/c ceftriaxone today ?--resume home Cefadroxil  ? ?History of COVID-19 ?Patient had COVID-19 viral infection in January while in subacute rehab ?Her COVID-19 PCR test is positive during this hospitalization ?COVID-19 cycle threshold is 38 and patient is no longer considered infectious ?No indication for isolation at this time ? ?Generalized weakness ?Secondary to chronic illness and acutely worsened by dizziness and inability to get out of bed ?Place patient on fall precautions ?PT evaluation ? ?Chronic diastolic CHF (congestive heart failure) ([place]) ?Patient has a history of chronic diastolic dysfunction CHF ?--takes torsemide 60 mg twice daily as well as spironolactone at home.  Likely dehydrated, since BUN elevated from baseline.  Both held after morning dose on 3/2. ?Plan: ?--cont to hold torsemide and aldactone ? ?Obesity (BMI 30-39.9) ?Complicates overall prognosis and care ?Lifestyle modification and exercise has been discussed with patient in detail ? ?Pansinusitis ?Patient presents for evaluation of vertigo and is found to have pansinusitis on CT head ?--started on ceftriaxone  ?--d/c ceftriaxone today ? ?Atrial fibrillation with tachy brady syndrome  ?History of paroxysmal A-fib ?Continue amiodarone and digoxin for rate control ?Continue Eliquis  as primary prophylaxis for an acute stroke ? ?Interstitial lung disease ([place]) ?With chronic hypoxemic respiratory failure ?Associated with  pulmonary hypertension ?--Unfortunately few options for her atrial fibrillation, will continue amiodarone, per cardio ?--Continue home sildenafil,selexipag and macitentan ?--Continue supplemental O2 to keep sats between 88-92% ? ? ? ? ? ? ? ? ?Subjective:  ?BP dropped some but maintained at 505 systolic after standing, however, pt still reported severe dizziness.  No dizziness with sitting, however, also has dizziness when turning her head quickly.  Continue to feel congested in her sinuses.  ? ?Discussed with oncall ENT, who rec outpatient followup. ? ? ?Physical Exam: ?Vitals:  ? 08/26/21 0918 08/26/21 1135 08/26/21 1540 08/26/21 1947  ?BP:  (!) 132/56 136/60 (!) 126/57  ?Pulse:  60 60 (!) 59  ?Resp:  '18 18 20  '$ ?Temp: 98.4 ?F (36.9 ?C) 98.1 ?F (36.7 ?C) 98.6 ?F (37 ?C) 98.2 ?F (36.8 ?C)  ?TempSrc:    Oral  ?SpO2:  99% 97% 97%  ?Weight:      ?Height:      ? ? ?Constitutional: NAD, AAOx3 ?HEENT: conjunctivae and lids normal, EOMI ?CV: No cyanosis.   ?RESP: normal respiratory effort ?SKIN: warm, dry ?Neuro: II - XII grossly intact.   ?Psych: Normal mood and affect.  Appropriate judgement and reason ? ? ?Data Reviewed: ? ?Family Communication:  ? ?Disposition: ?Status is: Inpatient ?Remains inpatient appropriate because: holding home diuretic now due to orthostasis, need cardiology to clear ? ? Planned Discharge Destination: home with [facility] ? ? ? ? ?Time spent: 50 minutes ? ?Author: ?Debra Bi, MD ?08/26/2021 8:27 PM ? ?For on call review [email or web address].  ? ?

## 2021-08-26 NOTE — Progress Notes (Addendum)
Mobility Specialist - Progress Note ? ? 08/26/21 1200  ?Mobility  ?Activity Ambulated with assistance in room  ?Level of Assistance Standby assist, set-up cues, supervision of patient - no hands on  ?Assistive Device Front wheel walker  ?Distance Ambulated (ft) 30 ft  ?Activity Response Tolerated well  ?$Mobility charge 1 Mobility  ? ? ?Pre-mobility: 69 HR, 93% SpO2 ?Post-mobility: 59 HR, 136/60 BP, 97% SpO2 ? ? ?Pt sitting in recliner upon arrival, utilizing 4L. Pt ambulated in room with supervision, no LOB. Increased dizziness with ambulation. Pt returned to recliner, BP 136/60 post-activity. Alarm set, needs in reach.  ? ? ?Kathee Delton ?Mobility Specialist ?08/26/21, 12:32 PM ? ?

## 2021-08-26 NOTE — Progress Notes (Signed)
? ?Progress Note ? ?Patient Name: Debra Saunders ?Date of Encounter: 08/26/2021 ? ?[facility] Cardiologist: [name], MD  ? ?Subjective  ? ?Orthostatics yesterday show better bps, but still some drop with position change. Patient says dizziness is about the same when she stands up.  ? ?Inpatient Medications  ?  ?Scheduled Meds: ? sodium chloride   Intravenous Once  ? amiodarone  200 mg Oral Daily  ? apixaban  5 mg Oral BID  ? Difluprednate  1 drop Ophthalmic BID  ? digoxin  0.125 mg Oral Daily  ? DULoxetine  60 mg Oral Daily  ? fluticasone  2 spray Each Nare Daily  ? gabapentin  600 mg Oral TID  ? macitentan  10 mg Oral Daily  ? midodrine  10 mg Oral TID WC  ? rosuvastatin  10 mg Oral Daily  ? Selexipag  1,600 mcg Oral BID  ? sildenafil  20 mg Oral TID  ? sodium chloride flush  3 mL Intravenous Q12H  ? ?Continuous Infusions: ? sodium chloride    ? cefTRIAXone (ROCEPHIN)  IV 2 g (08/25/21 2047)  ? ?PRN Meds: ?sodium chloride, acetaminophen **OR** acetaminophen, albuterol, meclizine, ondansetron **OR** ondansetron (ZOFRAN) IV, sodium chloride flush  ? ?Vital Signs  ?  ?Vitals:  ? 08/25/21 2115 08/26/21 0349 08/26/21 0500 08/26/21 0729  ?BP:  109/62  110/63  ?Pulse:  (!) 59  61  ?Resp: (!) '23 14  17  '$ ?Temp:  98.1 ?F (36.7 ?C)  98 ?F (36.7 ?C)  ?TempSrc:  Axillary  Axillary  ?SpO2:  97%  98%  ?Weight:   81 kg   ?Height:      ? ? ?Intake/Output Summary (Last 24 hours) at 08/26/2021 0840 ?Last data filed at 08/25/2021 2047 ?Gross per 24 hour  ?Intake 120 ml  ?Output --  ?Net 120 ml  ? ?Last 3 Weights 08/26/2021 08/25/2021 08/24/2021  ?Weight (lbs) 178 lb 9.2 oz 178 lb 9.2 oz 173 lb 4.5 oz  ?Weight (kg) 81 kg 81 kg 78.6 kg  ?   ? ?Telemetry  ?  ?AV pacing HR 60s - Personally Reviewed ? ?ECG  ?  ?No new - Personally Reviewed ? ?Physical Exam  ? ?GEN: No acute distress.   ?Neck: No JVD ?Cardiac: RRR, no murmurs, rubs, or gallops.  ?Respiratory: Clear to auscultation bilaterally. ?GI: Soft, nontender, non-distended  ?MS: No  edema; No deformity. ?Neuro:  Nonfocal  ?Psych: Normal affect  ? ?Labs  ?  ?High Sensitivity Troponin:   ?Recent Labs  ?Lab 08/21/21 ?1337 08/21/21 ?1622  ?TROPONINIHS 15 14  ?   ?Chemistry ?Recent Labs  ?Lab 08/24/21 ?8938 08/25/21 ?0434 08/26/21 ?0451  ?NA 137 131* 135  ?K 4.0 4.1 4.2  ?CL 99 97* 99  ?CO2 '28 30 28  '$ ?GLUCOSE 123* 121* 108*  ?BUN 33* 28* 25*  ?CREATININE 1.39* 1.15* 1.22*  ?CALCIUM 8.8* 8.8* 8.7*  ?MG 2.1 2.2 2.3  ?GFRNONAA 39* 49* 45*  ?ANIONGAP 10 4* 8  ?  ?Lipids No results for input(s): CHOL, TRIG, HDL, LABVLDL, LDLCALC, CHOLHDL in the last 168 hours.  ?Hematology ?Recent Labs  ?Lab 08/24/21 ?1017 08/25/21 ?0434 08/26/21 ?0451  ?WBC 6.4 7.1 7.7  ?RBC 3.50* 3.43* 3.44*  ?HGB 9.1* 9.0* 9.0*  ?HCT 29.5* 29.1* 29.6*  ?MCV 84.3 84.8 86.0  ?MCH 26.0 26.2 26.2  ?MCHC 30.8 30.9 30.4  ?RDW 14.0 14.3 14.4  ?PLT 230 257 298  ? ?Thyroid No results for input(s): TSH, FREET4 in the  last 168 hours.  ?BNP ?Recent Labs  ?Lab 08/21/21 ?1444  ?BNP 45.7  ?  ?DDimer No results for input(s): DDIMER in the last 168 hours.  ? ?Radiology  ?  ?No results found. ? ?Cardiac Studies  ? ?Echo 08/23/21 ? ? 1. Left ventricular ejection fraction, by estimation, is 60 to 65%. The  ?left ventricle has normal function. The left ventricle has no regional  ?wall motion abnormalities. There is mild left ventricular hypertrophy.  ?Left ventricular diastolic parameters  ?are consistent with Grade I diastolic dysfunction (impaired relaxation).  ? 2. Right ventricular systolic function is normal. The right ventricular  ?size is normal. There is mildly elevated pulmonary artery systolic  ?pressure. The estimated right ventricular systolic pressure is 77.1 mmHg.  ? 3. Left atrial size was moderately dilated.  ? 4. The mitral valve is normal in structure. No evidence of mitral valve  ?regurgitation. No evidence of mitral stenosis.  ? 5. Tricuspid valve regurgitation is moderate.  ? 6. The aortic valve is normal in structure. Aortic valve  regurgitation is  ?not visualized. Aortic valve sclerosis/calcification is present, without  ?any evidence of aortic stenosis.  ? 7. The inferior vena cava is normal in size with greater than 50%  ?respiratory variability, suggesting right atrial pressure of 3 mmHg.  ? 8. RV pacer wire difficult to visualize, grossly no vegetation noted.  ?  ?TEE July 04, 2021 ? ? 1. Left ventricular ejection fraction, by estimation, is 60 to 65%. The  ?left ventricle has normal function. There is mild left ventricular  ?hypertrophy.  ? 2. Peak RV-RA gradient 30 mmHg. There is a 1.6 x 1 cm mobile vegetation  ?attached to the pacemaker in the RA. Right ventricular systolic function  ?is normal. The right ventricular size is mildly enlarged.  ? 3. Left atrial size was mildly dilated. No left atrial/left atrial  ?appendage thrombus was detected.  ? 4. Right atrial size was moderately dilated.  ? 5. The mitral valve is normal in structure. Mild to moderate mitral valve  ?regurgitation. No evidence of mitral stenosis.  ? 6. There is mobile vegetation on the tricuspid valve. The tricuspid valve  ?is abnormal. Tricuspid valve regurgitation is moderate.  ? 7. The aortic valve is tricuspid. Aortic valve regurgitation is not  ?visualized. Aortic valve sclerosis/calcification is present, without any  ?evidence of aortic stenosis.  ? 8. Grade 3 plaque in the descending thoracic aorta.  ? 9. No PFO or ASD by color doppler.  ?  ?[place] 08/2020 ?1. Low/normal filling pressures.  ?2. Very mild pulmonary hypertension.  ?3. Preserved cardiac output.  ?  ?Given ongoing dyspnea, needs pulmonary evaluation.  ? ?Patient Profile  ?   ?76 y.o. 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 ?Assessment & Plan  ?  ?Symptomatic orthostasis/near syncope ?- worse in the setting of recent COVID infection, poor oral intake and diuretic use ?- torsemide and spiro held on admission ?- Echo this admission showed LVEF 60-65%, no WMA, mild LVH, G1DD, mildly elevated pulmonary artery pressure, moderately dilated LA, mod TR ?- she is on midodrine to '10mg'$  TID ?- continue abdominal binder and compression  socks ?- daily orthostatics ?[employer] plan to turn on minute ventilation today ?- Patient reports unchanged dizziness, may need to consider Florinef ? ?Pulmonary HTN ?- she was following advanced heart failure clinic.  ?- continue PTA medications.  ?- restart Torsemide when kidney function normalizes ?  ?Recent COVID infection ?-infection 2-3 weeks ago ?- may be contributing to breathing ?  ?Paroxysmal Afib ?- remains in NSR ?- continue amiodarone, digoxin ?- Eliquis for stroke ppx ?  ?ILD with chronic respiratory failure ?- she is on 4-5LO2 ?- she is on amiodarone, we will continue this for afib ? ?For questions or updates, please contact [place] ?Please consult [email or web address] for contact info under  ? ?  ?   ?Signed, ?Jeoffrey Eleazer Ninfa Meeker, PA-C  ?08/26/2021, 8:40 AM    ?

## 2021-08-27 ENCOUNTER — Ambulatory Visit (INDEPENDENT_AMBULATORY_CARE_PROVIDER_SITE_OTHER): Payer: Medicare HMO

## 2021-08-27 ENCOUNTER — Telehealth: Payer: Self-pay | Admitting: Family Medicine

## 2021-08-27 DIAGNOSIS — R42 Dizziness and giddiness: Secondary | ICD-10-CM | POA: Diagnosis not present

## 2021-08-27 DIAGNOSIS — I495 Sick sinus syndrome: Secondary | ICD-10-CM

## 2021-08-27 LAB — BASIC METABOLIC PANEL
Anion gap: 8 (ref 5–15)
BUN: 28 mg/dL — ABNORMAL HIGH (ref 8–23)
CO2: 28 mmol/L (ref 22–32)
Calcium: 8.6 mg/dL — ABNORMAL LOW (ref 8.9–10.3)
Chloride: 99 mmol/L (ref 98–111)
Creatinine, Ser: 1.24 mg/dL — ABNORMAL HIGH (ref 0.44–1.00)
GFR, Estimated: 45 mL/min — ABNORMAL LOW (ref >60)
Glucose, Bld: 106 mg/dL — ABNORMAL HIGH (ref 70–99)
Potassium: 4.3 mmol/L (ref 3.5–5.1)
Sodium: 135 mmol/L (ref 135–145)

## 2021-08-27 LAB — MAGNESIUM: Magnesium: 2.5 mg/dL — ABNORMAL HIGH (ref 1.7–2.4)

## 2021-08-27 LAB — CBC
HCT: 29 % — ABNORMAL LOW (ref 36.0–46.0)
Hemoglobin: 8.6 g/dL — ABNORMAL LOW (ref 12.0–15.0)
MCH: 25.8 pg — ABNORMAL LOW (ref 26.0–34.0)
MCHC: 29.7 g/dL — ABNORMAL LOW (ref 30.0–36.0)
MCV: 87.1 fL (ref 80.0–100.0)
Platelets: 277 10*3/uL (ref 150–400)
RBC: 3.33 MIL/uL — ABNORMAL LOW (ref 3.87–5.11)
RDW: 14.3 % (ref 11.5–15.5)
WBC: 7.5 10*3/uL (ref 4.0–10.5)
nRBC: 0 % (ref 0.0–0.2)

## 2021-08-27 MED ORDER — FLUTICASONE PROPIONATE 50 MCG/ACT NA SUSP: 2.0000 | Freq: Every day | NASAL | 2 refills | Status: DC

## 2021-08-27 MED ORDER — TORSEMIDE 20 MG PO TABS: 20.0000 mg | ORAL_TABLET | Freq: Every day | ORAL | 2 refills | Status: DC

## 2021-08-27 MED ORDER — MIDODRINE HCL 10 MG PO TABS: 10.0000 mg | ORAL_TABLET | Freq: Three times a day (TID) | ORAL | 2 refills | Status: DC

## 2021-08-27 MED ORDER — CEFADROXIL 500 MG PO CAPS: 500.0000 mg | ORAL_CAPSULE | Freq: Two times a day (BID) | ORAL | 2 refills | Status: AC

## 2021-08-27 NOTE — Discharge Summary (Signed)
Physician Discharge Summary   Debra Saunders  female DOB: [birth year]  ZOX:[id number]  PCP: [name], MD  Admit date: 08/21/2021 Discharge date: 08/27/2021  Admitted From: home Disposition:  home Daughter updated on the phone prior to discharge.  Home Health: Yes CODE STATUS: Full code  Discharge Instructions     Discharge instructions   Complete by: As directed    Your midodrine is increased, your torsemide is decreased, your blood pressure is now 100's when you stand.  You are negative for vertigo.  Please wear TED hose.  We have optimized you for your orthostasis, but you still have dizziness when standing.  Try to get up more and build up more tolerance with your dizziness.  Also, it appears that you were supposed to follow up with infectious disease provider Mauricio Po around 08/15/21, but I don't see that you have.  He wants you to continue taking antibiotic cefadroxil for suppression.   Dr. Enzo Bi [facility] Course:  For full details, please see H&P, progress notes, consult notes and ancillary notes.  Briefly,  Debra Saunders is a 76 y.o. 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    She stated that over the last several days she gets dizzy with any form of movement and her gait is very unsteady despite the use of an assist device.  She is having to hold onto her daughter to prevent her from falling.  She complains of a lot of  postnasal drip as well as noises in her ears.  She complains of feeling very weak and tired.   * Severe dizziness --severe dizziness only upon standing, no symptom while sitting.  Initially thought to be due to severe orthostasis, however, even when systolic maintained at 811 with standing, pt still complained of severe dizziness.  Pt specifically denied room spinning. --PT performed several eval for BPPV which all returned neg.   --Since pt presented with pansinusitis, vestibular neuritis was considered, however, pt denied vertigo, and the treatment would be supportive care.  I discussed with oncall ENT Dr. Kathyrn Sheriff, who rec outpatient clinic f/u. --Meclizine PRN --frequent mobility as tolerated to adapt to dizziness.  --outpatient f/u with ENT   Orthostatic hypotension --Per PT eval on 3/2, BP dropped to 60's-70's when standing.  Pt is very symptomatic with severe dizziness and near syncope with standing. --Pt said this is a known problem, and takes midodrine to increase BP --on torsemide 60 mg BID and aldactone 12.5 mg daily PTA, which were held since 3/2, with improvement in BP. --cardiology consulted --midodrine increased on 3/3 to 10 mg TID. --ordered TED hose and abdominal binder --Pt had daily orthostatic BP done, and for 2 days prior to discharge, systolic while standing were 100's-110's.   --Pt was discharged on reduced torsemide 20 mg daily and home aldactone, and increased midodrine. --outpatient f/u with cardiology   Anemia due to chronic kidney disease --baseline Hgb 7-8 --1u pRBC on 3/3 for Hgb 7.9 in the  hopes of improving her symptoms of weakness   Stage 3b chronic kidney disease (CKD) (HCC) Cr stable and at baseline.   History of endocarditis Patient recently treated for endocarditis/infection due to implanted cardiac device. Recently completed IV Cefazolin through 08/08/21 and then transitioned to Cefadroxil 500 mg BID for chronic suppression, per ID clinic note on  07/25/21.   --pt was discharged on Cefadroxil.  --Pt was supposed to f/u with ID Mauricio Po around 08/15/21, which did not happen.  Pt and daughter advised to f/u with ID as soon as possible after discharge.   History of COVID-19 Patient had COVID-19 viral infection in January while in subacute rehab   Generalized weakness Secondary to chronic illness and acutely worsened by dizziness and inability to get out of bed --worked with PT during hospitalization.  Discharged on [facility] PT/OT   Chronic diastolic CHF (congestive heart failure) ([place]) Patient has a history of chronic diastolic dysfunction CHF --PTA torsemide 60 mg twice daily as well as spironolactone at home. --on presentation, pt was likely dehydrated, since BP low, and BUN elevated from baseline while Cr was stable.  Both held after morning dose on 3/2. --Pt was discharged on reduced torsemide 20 mg daily and home aldactone, with agreement from cardiology.   Obesity (BMI 30-39.9) Complicates overall prognosis and care   Pansinusitis --seen on CT head --was started on ceftriaxone on admission for empiric tx, and switched back to home Cefadroxil prior to discharge. --supportive care   History of paroxysmal A-fib with tachy brady syndrome  s/p PPM Corporate investment banker) Continue amiodarone and digoxin for rate control Continue Eliquis as primary prophylaxis for an acute stroke   Interstitial lung disease ([place]) With chronic hypoxemic respiratory failure Associated with pulmonary hypertension --Unfortunately few options for her atrial fibrillation, will continue amiodarone, per cardio --Continue home sildenafil, selexipag and macitentan --Continue supplemental O2 to keep sats between 88-92%   Discharge Diagnoses:  Principal Problem:   Severe dizziness Active Problems:   Orthostatic hypotension   Interstitial lung disease (HCC)   Atrial fibrillation with tachy brady syndrome    Pansinusitis   Obesity (BMI 30-39.9)   Chronic  diastolic CHF (congestive heart failure) (HCC)   Generalized weakness   Acute pansinusitis   History of COVID-19   History of endocarditis   Stage 3b chronic kidney disease (CKD) ([place])   Anemia due to chronic kidney disease   30 Day Unplanned Readmission Risk Score    Flowsheet Row ED to Hosp-Admission (Current) from 08/21/2021 in [place] [facility] PCU  30 Day Unplanned Readmission Risk Score (%) 34.62 Filed at 08/27/2021 0801       This score is the patient's risk of an unplanned readmission within 30 days of being discharged (0 -100%). The score is based on dignosis, age, lab data, medications, orders, and past utilization.   Low:  0-14.9   Medium: 15-21.9   High: 22-29.9   Extreme: 30 and above         Discharge Instructions:  Allergies as of 08/27/2021       Reactions   Aspirin Anaphylaxis, Hives   Pt states that she is unable to take because she had bleeding in her brain   Other Other (See Comments)   Pt states that she is unable to take blood thinners because she had bleeding in her brain; Blood Thinners-per doctor at [facility] (Xarelto)        Medication List     STOP taking these medications  cholestyramine light 4 g packet Commonly known as: PREVALITE   Magnesium 200 MG Tabs   pantoprazole 40 MG tablet Commonly known as: PROTONIX   potassium chloride 10 MEQ tablet Commonly known as: KLOR-CON   potassium chloride SA 20 MEQ tablet Commonly known as: KLOR-CON M       TAKE these medications    acetaminophen 325 MG tablet Commonly known as: TYLENOL Take 325-650 mg by mouth every 6 (six) hours as needed for mild pain.   albuterol 0.63 MG/3ML nebulizer solution Commonly known as: ACCUNEB Take 1 ampule by nebulization every 6 (six) hours as needed for wheezing.   amiodarone 200 MG tablet Commonly known as: PACERONE Take 1 tablet (200 mg total) by mouth daily.   apixaban 5 MG Tabs tablet Commonly known as: ELIQUIS Take 5 mg by  mouth 2 (two) times daily.   benzonatate 200 MG capsule Commonly known as: TESSALON Take 1 capsule (200 mg total) by mouth 3 (three) times daily as needed for cough.   BIOFREEZE EX Apply 1 application topically 4 (four) times daily as needed (for pain).   cefadroxil 500 MG capsule Commonly known as: DURICEF Take 1 capsule (500 mg total) by mouth 2 (two) times daily.   Difluprednate 0.05 % Emul Place 1 drop into both eyes 2 (two) times daily.   digoxin 0.125 MG tablet Commonly known as: LANOXIN Take by mouth daily.   DULoxetine 60 MG capsule Commonly known as: CYMBALTA Take 60 mg by mouth daily. What changed: Another medication with the same name was removed. Continue taking this medication, and follow the directions you see here.   fluticasone 50 MCG/ACT nasal spray Commonly known as: FLONASE Place 2 sprays into both nostrils daily for 10 days.   gabapentin 300 MG capsule Commonly known as: NEURONTIN Take 600 mg by mouth 3 (three) times daily.   macitentan 10 MG tablet Commonly known as: OPSUMIT Take 1 tablet (10 mg total) by mouth daily.   meclizine 25 MG tablet Commonly known as: ANTIVERT Take 1 tablet (25 mg total) by mouth 3 (three) times daily as needed for dizziness. What changed: Another medication with the same name was removed. Continue taking this medication, and follow the directions you see here.   midodrine 10 MG tablet Commonly known as: PROAMATINE Take 1 tablet (10 mg total) by mouth 3 (three) times daily with meals. What changed:  medication strength how much to take   rosuvastatin 10 MG tablet Commonly known as: CRESTOR Take 1 tablet by mouth once daily   sildenafil 20 MG tablet Commonly known as: REVATIO Take 20 mg by mouth 3 (three) times daily.   spironolactone 25 MG tablet Commonly known as: ALDACTONE Take 12.5 mg by mouth daily.   torsemide 20 MG tablet Commonly known as: DEMADEX Take 1 tablet (20 mg total) by mouth daily. Dose  reduced from 60 mg twice daily. What changed:  how much to take when to take this additional instructions Another medication with the same name was removed. Continue taking this medication, and follow the directions you see here.   Uptravi 1600 MCG Tabs Generic drug: Selexipag Take 1 tablet (1,600 mcg total) by mouth 2 (two) times daily.         Follow-up Information     Golden Circle, FNP Follow up on 09/04/2021.   Specialty: Infectious Diseases Why: as soon as possible. @ 1:45pm Contact information: [address] [place] [address] [phone]  [name], MD Follow up on 08/30/2021.   Specialty: Family Medicine Why: @ 10am Contact information: [address] [place] [address] [phone]         Wellington Hampshire, MD Follow up on 08/29/2021.   Specialty: Cardiology Why: @ 9:20am Contact information: [place] [address] [phone]         Margaretha Sheffield, MD Follow up.   Specialty: Otolaryngology Why: As needed Contact information: [address] [place] [address] [phone]                 Allergies  Allergen Reactions   Aspirin Anaphylaxis and Hives    Pt states that she is unable to take because she had bleeding in her brain   Other Other (See Comments)    Pt states that she is unable to take blood thinners because she had bleeding in her brain; Blood Thinners-per doctor at [facility] (Xarelto)     The results of significant diagnostics from this hospitalization (including imaging, microbiology, ancillary and laboratory) are listed below for reference.   Consultations:   Procedures/Studies: DG Chest 2 View  Result Date: 08/21/2021 CLINICAL DATA:  Fatigue, recent endocarditis, dizziness for few weeks, nausea since Saturday EXAM: CHEST - 2 VIEW COMPARISON:  07/03/2021 FINDINGS: LEFT subclavian sequential transvenous pacemaker leads  project at RIGHT atrium and RIGHT ventricle. Enlargement of cardiac silhouette with pulmonary vascular congestion. Atherosclerotic calcification aorta. Scattered interstitial infiltrates in both lungs favoring pulmonary edema. No pleural effusion or pneumothorax. Mild persistent elevation of RIGHT diaphragm. No acute osseous findings. IMPRESSION: Enlargement of cardiac silhouette with pulmonary vascular congestion and persistent interstitial infiltrates favoring pulmonary edema. Aortic Atherosclerosis (ICD10-I70.0). Electronically Signed   By: Lavonia Dana M.D.   On: 08/21/2021 15:07   CT Head Wo Contrast  Result Date: 08/21/2021 CLINICAL DATA:  Dizziness EXAM: CT HEAD WITHOUT CONTRAST TECHNIQUE: Contiguous axial images were obtained from the base of the skull through the vertex without intravenous contrast. RADIATION DOSE REDUCTION: This exam was performed according to the departmental dose-optimization program which includes automated exposure control, adjustment of the mA and/or kV according to patient size and/or use of iterative reconstruction technique. COMPARISON:  CT head 03/30/2015 FINDINGS: Brain: No acute intracranial hemorrhage, mass effect, or herniation. No extra-axial fluid collections. No evidence of acute territorial infarct. No hydrocephalus. Mild patchy hypodensities in the periventricular and subcortical white matter appears stable and likely represent chronic microvascular ischemic changes. Vascular: Calcified plaques in the carotid siphons. Skull: Normal. Negative for fracture or focal lesion. Sinuses/Orbits: Air-fluid level seen throughout all of the paranasal sinuses as well as moderate to severe opacification of the ethmoid air cells. Extensive opacification also seen in the bilateral mastoid air cells. Other: None. IMPRESSION: 1. No acute intracranial process identified. Chronic microvascular ischemic changes. 2. Evidence of acute pansinusitis as well as extensive opacification in the  bilateral mastoid air cells, correlate clinically. Electronically Signed   By: Ofilia Neas M.D.   On: 08/21/2021 14:44   CT CHEST WO CONTRAST  Result Date: 08/21/2021 CLINICAL DATA:  Dizziness. EXAM: CT CHEST WITHOUT CONTRAST TECHNIQUE: Multidetector CT imaging of the chest was performed following the standard protocol without IV contrast. RADIATION DOSE REDUCTION: This exam was performed according to the departmental dose-optimization program which includes automated exposure control, adjustment of the mA and/or kV according to patient size and/or use of iterative reconstruction technique. COMPARISON:  Radiograph of same day.  CT of October 15, 2020. FINDINGS: Cardiovascular: Atherosclerosis of thoracic aorta is noted without aneurysm formation. Mild cardiomegaly is noted. No pericardial effusion is noted. Mediastinum/Nodes: Stable enlarged thyroid gland with multiple small nodules. Stable calcified subcarinal adenopathy is noted consistent with prior granulomatous disease. Esophagus is unremarkable. Lungs/Pleura: No pneumothorax or pleural effusion is noted. Stable reticular opacities and bronchiectasis is seen in the upper and lower lobes bilaterally suggesting fibrosis or possible chronic pneumonitis. Interval development of 2.1 x 1.6 cm irregular density seen in the right lower lobe best seen on image 80 of series 3. This may represent focal inflammation or atelectasis, but neoplasm cannot be excluded. Evaluation is limited due to the lack of intravenous contrast. Upper Abdomen: No acute abnormality. Musculoskeletal: No chest wall mass or suspicious bone lesions identified. IMPRESSION: Stable large thyroid gland with multiple nodules. Not clinically significant; no follow-up imaging recommended. (Ref: J Am Coll Radiol. 2015 Feb;12(2): 143-50). There is again noted stable reticular opacities and bronchiectasis seen involving the upper and lower lobes bilaterally suggesting fibrosis or possibly chronic  pneumonitis. There is noted interval development of 2.1 x 1.6 cm irregular nodular density in the right lower lobe; this may represent focal inflammation or atelectasis, but neoplasm or malignancy cannot be excluded. Follow-up CT scan of the chest with intravenous contrast in 2-3 weeks is recommended to ensure stability or resolution. Aortic Atherosclerosis (ICD10-I70.0). Electronically Signed   By: Marijo Conception M.D.   On: 08/21/2021 17:15   ECHOCARDIOGRAM COMPLETE  Result Date: 08/23/2021    ECHOCARDIOGRAM REPORT   Patient Name:   Debra Saunders Date of Exam: 08/23/2021 Medical Rec #:  [id number]        Height:       60.0 in Accession #:    [id number]       Weight:       181.2 lb Date of Birth:  [birth year]         BSA:          1.790 m Patient Age:    76 years         BP:           105/54 mmHg Patient Gender: F                HR:           60 bpm. Exam Location:  [facility] Procedure: 2D Echo, Cardiac Doppler and Color Doppler Indications:     Endocarditis I38  History:         Patient has prior history of Echocardiogram examinations, most                  recent 07/04/2021. Pacemaker, Arrythmias:Atrial Fibrillation,                  Signs/Symptoms:Chest Pain; Risk Factors:Hypertension.  Sonographer:     Sherrie Sport Referring Phys:  [place] Diagnosing Phys: Ida Rogue MD  Sonographer Comments: Suboptimal apical window. IMPRESSIONS  1. Left ventricular ejection fraction, by estimation, is 60 to 65%. The left ventricle has normal function. The left ventricle has no regional wall motion abnormalities. There is mild left ventricular hypertrophy. Left ventricular diastolic parameters are consistent with Grade I diastolic dysfunction (impaired relaxation).  2. Right ventricular systolic function is normal. The right ventricular size is normal. There is mildly elevated pulmonary artery systolic pressure. The estimated right ventricular systolic pressure is 02.7 mmHg.  3. Left atrial size was moderately  dilated.  4. The mitral valve is  normal in structure. No evidence of mitral valve regurgitation. No evidence of mitral stenosis.  5. Tricuspid valve regurgitation is moderate.  6. The aortic valve is normal in structure. Aortic valve regurgitation is not visualized. Aortic valve sclerosis/calcification is present, without any evidence of aortic stenosis.  7. The inferior vena cava is normal in size with greater than 50% respiratory variability, suggesting right atrial pressure of 3 mmHg.  8. RV pacer wire difficult to visualize, grossly no vegetation noted. FINDINGS  Left Ventricle: Left ventricular ejection fraction, by estimation, is 60 to 65%. The left ventricle has normal function. The left ventricle has no regional wall motion abnormalities. The left ventricular internal cavity size was normal in size. There is  mild left ventricular hypertrophy. Left ventricular diastolic parameters are consistent with Grade I diastolic dysfunction (impaired relaxation). Right Ventricle: The right ventricular size is normal. No increase in right ventricular wall thickness. Right ventricular systolic function is normal. There is mildly elevated pulmonary artery systolic pressure. The tricuspid regurgitant velocity is 2.97  m/s, and with an assumed right atrial pressure of 5 mmHg, the estimated right ventricular systolic pressure is 02.5 mmHg. Left Atrium: Left atrial size was moderately dilated. Right Atrium: Right atrial size was normal in size. Pericardium: There is no evidence of pericardial effusion. Mitral Valve: The mitral valve is normal in structure. No evidence of mitral valve regurgitation. No evidence of mitral valve stenosis. MV peak gradient, 3.7 mmHg. The mean mitral valve gradient is 1.0 mmHg. Tricuspid Valve: The tricuspid valve is normal in structure. Tricuspid valve regurgitation is moderate . No evidence of tricuspid stenosis. Aortic Valve: The aortic valve is normal in structure. Aortic valve regurgitation  is not visualized. Aortic valve sclerosis/calcification is present, without any evidence of aortic stenosis. Aortic valve mean gradient measures 5.0 mmHg. Aortic valve peak  gradient measures 8.2 mmHg. Aortic valve area, by VTI measures 2.80 cm. Pulmonic Valve: The pulmonic valve was normal in structure. Pulmonic valve regurgitation is mild. No evidence of pulmonic stenosis. Aorta: The aortic root is normal in size and structure. Venous: The inferior vena cava is normal in size with greater than 50% respiratory variability, suggesting right atrial pressure of 3 mmHg. IAS/Shunts: No atrial level shunt detected by color flow Doppler. Additional Comments: A device lead is visualized.  LEFT VENTRICLE PLAX 2D LVIDd:         4.00 cm   Diastology LVIDs:         2.20 cm   LV e' medial:    4.90 cm/s LV PW:         1.30 cm   LV E/e' medial:  13.0 LV IVS:        1.30 cm   LV e' lateral:   4.13 cm/s LVOT diam:     2.00 cm   LV E/e' lateral: 15.4 LV SV:         63 LV SV Index:   35 LVOT Area:     3.14 cm  RIGHT VENTRICLE RV S prime:     19.50 cm/s TAPSE (M-mode): 2.7 cm LEFT ATRIUM             Index        RIGHT ATRIUM           Index LA diam:        3.20 cm 1.79 cm/m   RA Area:     15.50 cm LA Vol (A2C):   98.9 ml 55.25 ml/m  RA Volume:  30.80 ml  17.21 ml/m LA Vol (A4C):   66.3 ml 37.04 ml/m LA Biplane Vol: 80.6 ml 45.03 ml/m  AORTIC VALVE                    PULMONIC VALVE AV Area (Vmax):    2.46 cm     PV Vmax:        0.90 m/s AV Area (Vmean):   2.32 cm     PV Vmean:       53.100 cm/s AV Area (VTI):     2.80 cm     PV VTI:         0.128 m AV Vmax:           143.00 cm/s  PV Peak grad:   3.2 mmHg AV Vmean:          99.500 cm/s  PV Mean grad:   1.0 mmHg AV VTI:            0.223 m      RVOT Peak grad: 9 mmHg AV Peak Grad:      8.2 mmHg AV Mean Grad:      5.0 mmHg LVOT Vmax:         112.00 cm/s LVOT Vmean:        73.500 cm/s LVOT VTI:          0.199 m LVOT/AV VTI ratio: 0.89  AORTA Ao Root diam: 3.20 cm MITRAL VALVE                TRICUSPID VALVE MV Area (PHT): 1.82 cm    TR Peak grad:   35.3 mmHg MV Area VTI:   1.98 cm    TR Vmax:        297.00 cm/s MV Peak grad:  3.7 mmHg MV Mean grad:  1.0 mmHg    SHUNTS MV Vmax:       0.96 m/s    Systemic VTI:  0.20 m MV Vmean:      49.8 cm/s   Systemic Diam: 2.00 cm MV Decel Time: 416 msec    Pulmonic VTI:  0.208 m MV E velocity: 63.80 cm/s MV A velocity: 86.50 cm/s MV E/A ratio:  0.74 Ida Rogue MD Electronically signed by Ida Rogue MD Signature Date/Time: 08/23/2021/2:28:52 PM    Final       Labs: BNP (last 3 results) Recent Labs    06/25/21 1350 07/22/21 1101 08/21/21 1444  BNP 344.7* 244.4* 31.5   Basic Metabolic Panel: Recent Labs  Lab 08/23/21 0519 08/24/21 0558 08/25/21 0434 08/26/21 0451 08/27/21 0641  NA 134* 137 131* 135 135  K 4.4 4.0 4.1 4.2 4.3  CL 95* 99 97* 99 99  CO2 '29 28 30 28 28  '$ GLUCOSE 148* 123* 121* 108* 106*  BUN 34* 33* 28* 25* 28*  CREATININE 1.47* 1.39* 1.15* 1.22* 1.24*  CALCIUM 8.9 8.8* 8.8* 8.7* 8.6*  MG 1.8 2.1 2.2 2.3 2.5*   Liver Function Tests: No results for input(s): AST, ALT, ALKPHOS, BILITOT, PROT, ALBUMIN in the last 168 hours. No results for input(s): LIPASE, AMYLASE in the last 168 hours. No results for input(s): AMMONIA in the last 168 hours. CBC: Recent Labs  Lab 08/21/21 1337 08/22/21 0619 08/23/21 0519 08/24/21 0558 08/25/21 0434 08/26/21 0451 08/27/21 0641  WBC 7.6   < > 6.7 6.4 7.1 7.7 7.5  NEUTROABS 5.5  --   --   --   --   --   --  HGB 9.6*   < > 7.9* 9.1* 9.0* 9.0* 8.6*  HCT 32.1*   < > 26.4* 29.5* 29.1* 29.6* 29.0*  MCV 87.2   < > 85.4 84.3 84.8 86.0 87.1  PLT 244   < > 210 230 257 298 277   < > = values in this interval not displayed.   Cardiac Enzymes: No results for input(s): CKTOTAL, CKMB, CKMBINDEX, TROPONINI in the last 168 hours. BNP: Invalid input(s): POCBNP CBG: Recent Labs  Lab 08/23/21 0803 08/23/21 1232 08/23/21 1702 08/23/21 1950 08/26/21 1137  GLUCAP 123*  204* 127* 158* 150*   D-Dimer No results for input(s): DDIMER in the last 72 hours. Hgb A1c No results for input(s): HGBA1C in the last 72 hours. Lipid Profile No results for input(s): CHOL, HDL, LDLCALC, TRIG, CHOLHDL, LDLDIRECT in the last 72 hours. Thyroid function studies No results for input(s): TSH, T4TOTAL, T3FREE, THYROIDAB in the last 72 hours.  Invalid input(s): FREET3 Anemia work up No results for input(s): VITAMINB12, FOLATE, FERRITIN, TIBC, IRON, RETICCTPCT in the last 72 hours. Urinalysis    Component Value Date/Time   COLORURINE AMBER (A) 06/25/2021 0610   APPEARANCEUR CLOUDY (A) 06/25/2021 0610   APPEARANCEUR Clear 11/18/2013 0443   LABSPEC 1.016 06/25/2021 0610   LABSPEC 1.049 11/18/2013 0443   PHURINE 5.0 06/25/2021 0610   GLUCOSEU NEGATIVE 06/25/2021 0610   GLUCOSEU Negative 11/18/2013 0443   HGBUR NEGATIVE 06/25/2021 0610   BILIRUBINUR NEGATIVE 06/25/2021 0610   BILIRUBINUR neg 11/04/2019 1526   BILIRUBINUR Negative 11/18/2013 0443   KETONESUR NEGATIVE 06/25/2021 0610   PROTEINUR 30 (A) 06/25/2021 0610   UROBILINOGEN 0.2 11/04/2019 1526   NITRITE NEGATIVE 06/25/2021 0610   LEUKOCYTESUR SMALL (A) 06/25/2021 0610   LEUKOCYTESUR Negative 11/18/2013 0443   Sepsis Labs Invalid input(s): PROCALCITONIN,  WBC,  LACTICIDVEN Microbiology Recent Results (from the past 240 hour(s))  Resp Panel by RT-PCR (Flu A&B, Covid) Nasopharyngeal Swab     Status: Abnormal   Collection Time: 08/21/21  1:37 PM   Specimen: Nasopharyngeal Swab; Nasopharyngeal(NP) swabs in vial transport medium  Result Value Ref Range Status   SARS Coronavirus 2 by RT PCR POSITIVE (A) NEGATIVE Final    Comment: (NOTE) SARS-CoV-2 target nucleic acids are DETECTED.  The SARS-CoV-2 RNA is generally detectable in upper respiratory specimens during the acute phase of infection. Positive results are indicative of the presence of the identified virus, but do not rule out bacterial infection or  co-infection with other pathogens not detected by the test. Clinical correlation with patient history and other diagnostic information is necessary to determine patient infection status. The expected result is Negative.  Fact Sheet for Patients: EntrepreneurPulse.com.au  Fact Sheet for Healthcare Providers: IncredibleEmployment.be  This test is not yet approved or cleared by the [place] FDA and  has been authorized for detection and/or diagnosis of SARS-CoV-2 by FDA under an Emergency Use Authorization (EUA).  This EUA will remain in effect (meaning this test can be used) for the duration of  the COVID-19 declaration under Section 564(b)(1) of the A ct, 21 U.S.C. section 360bbb-3(b)(1), unless the authorization is terminated or revoked sooner.     Influenza A by PCR NEGATIVE NEGATIVE Final   Influenza B by PCR NEGATIVE NEGATIVE Final    Comment: (NOTE) The Xpert Xpress SARS-CoV-2/FLU/RSV plus assay is intended as an aid in the diagnosis of influenza from Nasopharyngeal swab specimens and should not be used as a sole basis for treatment. Nasal washings and aspirates are unacceptable for  Xpert Xpress SARS-CoV-2/FLU/RSV testing.  Fact Sheet for Patients: EntrepreneurPulse.com.au  Fact Sheet for Healthcare Providers: IncredibleEmployment.be  This test is not yet approved or cleared by the [place] FDA and has been authorized for detection and/or diagnosis of SARS-CoV-2 by FDA under an Emergency Use Authorization (EUA). This EUA will remain in effect (meaning this test can be used) for the duration of the COVID-19 declaration under Section 564(b)(1) of the Act, 21 U.S.C. section 360bbb-3(b)(1), unless the authorization is terminated or revoked.  Performed at [facility], [address] [place], [place] [address]   Culture, blood (Routine X 2) w Reflex to ID Panel     Status: None    Collection Time: 08/21/21  3:15 PM   Specimen: BLOOD  Result Value Ref Range Status   Specimen Description BLOOD LEFT ANTECUBITAL  Final   Special Requests BOTTLES DRAWN AEROBIC AND ANAEROBIC BCAV  Final   Culture   Final    NO GROWTH 5 DAYS Performed at [facility], [address], [place], [place] [address]    Report Status 08/26/2021 FINAL  Final  Culture, blood (Routine X 2) w Reflex to ID Panel     Status: None   Collection Time: 08/21/21  3:20 PM   Specimen: BLOOD  Result Value Ref Range Status   Specimen Description BLOOD RIGHT ANTECUBITAL  Final   Special Requests BOTTLES DRAWN AEROBIC AND ANAEROBIC BCAV  Final   Culture   Final    NO GROWTH 5 DAYS Performed at [facility], [address], [place], [place] [address]    Report Status 08/26/2021 FINAL  Final  MRSA Next Gen by PCR, Nasal     Status: None   Collection Time: 08/26/21  8:48 PM   Specimen: Nasal Mucosa; Nasal Swab  Result Value Ref Range Status   MRSA by PCR Next Gen NOT DETECTED NOT DETECTED Final    Comment: (NOTE) [name] (FDA approved for NASAL specimens only), is one component of a comprehensive MRSA colonization surveillance program. It is not intended to diagnose MRSA infection nor to guide or monitor treatment for MRSA infections. Test performance is not FDA approved in patients less than 33 years old. Performed at [facility], [address] [place], [place] [address]      Total time spend on discharging this patient, including the last patient exam, discussing the hospital stay, instructions for ongoing care as it relates to all pertinent caregivers, as well as preparing the medical discharge records, prescriptions, and/or referrals as applicable, is 45 minutes.    Enzo Bi, MD  [facility] 08/27/2021, 12:54 PM

## 2021-08-27 NOTE — Telephone Encounter (Signed)
I faxed the order to [place] , confirmation given.  Kye Hedden,cma  ?

## 2021-08-27 NOTE — Progress Notes (Addendum)
? ?Progress Note ? ?Patient Name: Debra Saunders ?Date of Encounter: 08/27/2021 ? ?[facility] Cardiologist: [name], MD  ? ?Subjective  ? ?BPS better, orthostatics still with a drop with position change. Patient says she still has dizziness when walking, but overall improved.  ? ?Inpatient Medications  ?  ?Scheduled Meds: ? sodium chloride   Intravenous Once  ? amiodarone  200 mg Oral Daily  ? apixaban  5 mg Oral BID  ? cefadroxil  500 mg Oral BID  ? Difluprednate  1 drop Ophthalmic BID  ? digoxin  0.125 mg Oral Daily  ? DULoxetine  60 mg Oral Daily  ? fluticasone  2 spray Each Nare Daily  ? gabapentin  600 mg Oral TID  ? macitentan  10 mg Oral Daily  ? midodrine  10 mg Oral TID WC  ? rosuvastatin  10 mg Oral Daily  ? Selexipag  1,600 mcg Oral BID  ? sildenafil  20 mg Oral TID  ? sodium chloride flush  3 mL Intravenous Q12H  ? ?Continuous Infusions: ? sodium chloride    ? ?PRN Meds: ?sodium chloride, acetaminophen **OR** acetaminophen, albuterol, meclizine, ondansetron **OR** ondansetron (ZOFRAN) IV, sodium chloride flush  ? ?Vital Signs  ?  ?Vitals:  ? 08/26/21 2347 08/27/21 0428 08/27/21 0500 08/27/21 0726  ?BP: 140/61 (!) 121/54  111/62  ?Pulse: 60 62  60  ?Resp: '18 16  17  '$ ?Temp: 98.3 ?F (36.8 ?C) 97.9 ?F (36.6 ?C)  98 ?F (36.7 ?C)  ?TempSrc:  Axillary  Axillary  ?SpO2: 99% 97%  97%  ?Weight:   83.5 kg   ?Height:      ? ? ?Intake/Output Summary (Last 24 hours) at 08/27/2021 0913 ?Last data filed at 08/27/2021 0551 ?Gross per 24 hour  ?Intake 480 ml  ?Output 800 ml  ?Net -320 ml  ? ?Last 3 Weights 08/27/2021 08/26/2021 08/25/2021  ?Weight (lbs) 184 lb 1.4 oz 178 lb 9.2 oz 178 lb 9.2 oz  ?Weight (kg) 83.5 kg 81 kg 81 kg  ?   ? ?Telemetry  ?  ?AV pacing HR 60s - Personally Reviewed ? ?ECG  ?  ?No new - Personally Reviewed ? ?Physical Exam  ? ?GEN: No acute distress.   ?Neck: No JVD ?Cardiac: RRR, + murmurs, no rubs, or gallops.  ?Respiratory: mild crackles. ?GI: Soft, nontender, non-distended  ?MS: No edema; No  deformity. ?Neuro:  Nonfocal  ?Psych: Normal affect  ? ?Labs  ?  ?High Sensitivity Troponin:   ?Recent Labs  ?Lab 08/21/21 ?1337 08/21/21 ?1622  ?TROPONINIHS 15 14  ?   ?Chemistry ?Recent Labs  ?Lab 08/25/21 ?2409 08/26/21 ?0451 08/27/21 ?7353  ?NA 131* 135 135  ?K 4.1 4.2 4.3  ?CL 97* 99 99  ?CO2 '30 28 28  '$ ?GLUCOSE 121* 108* 106*  ?BUN 28* 25* 28*  ?CREATININE 1.15* 1.22* 1.24*  ?CALCIUM 8.8* 8.7* 8.6*  ?MG 2.2 2.3 2.5*  ?GFRNONAA 49* 45* 45*  ?ANIONGAP 4* 8 8  ?  ?Lipids No results for input(s): CHOL, TRIG, HDL, LABVLDL, LDLCALC, CHOLHDL in the last 168 hours.  ?Hematology ?Recent Labs  ?Lab 08/25/21 ?2992 08/26/21 ?0451 08/27/21 ?4268  ?WBC 7.1 7.7 7.5  ?RBC 3.43* 3.44* 3.33*  ?HGB 9.0* 9.0* 8.6*  ?HCT 29.1* 29.6* 29.0*  ?MCV 84.8 86.0 87.1  ?MCH 26.2 26.2 25.8*  ?MCHC 30.9 30.4 29.7*  ?RDW 14.3 14.4 14.3  ?PLT 257 298 277  ? ?Thyroid No results for input(s): TSH, FREET4 in the last 168  hours.  ?BNP ?Recent Labs  ?Lab 08/21/21 ?1444  ?BNP 45.7  ?  ?DDimer No results for input(s): DDIMER in the last 168 hours.  ? ?Radiology  ?  ?No results found. ? ?Cardiac Studies  ? ?Echo 08/23/21 ? ? 1. Left ventricular ejection fraction, by estimation, is 60 to 65%. The  ?left ventricle has normal function. The left ventricle has no regional  ?wall motion abnormalities. There is mild left ventricular hypertrophy.  ?Left ventricular diastolic parameters  ?are consistent with Grade I diastolic dysfunction (impaired relaxation).  ? 2. Right ventricular systolic function is normal. The right ventricular  ?size is normal. There is mildly elevated pulmonary artery systolic  ?pressure. The estimated right ventricular systolic pressure is 97.0 mmHg.  ? 3. Left atrial size was moderately dilated.  ? 4. The mitral valve is normal in structure. No evidence of mitral valve  ?regurgitation. No evidence of mitral stenosis.  ? 5. Tricuspid valve regurgitation is moderate.  ? 6. The aortic valve is normal in structure. Aortic valve  regurgitation is  ?not visualized. Aortic valve sclerosis/calcification is present, without  ?any evidence of aortic stenosis.  ? 7. The inferior vena cava is normal in size with greater than 50%  ?respiratory variability, suggesting right atrial pressure of 3 mmHg.  ? 8. RV pacer wire difficult to visualize, grossly no vegetation noted.  ?  ?TEE July 04, 2021 ? ? 1. Left ventricular ejection fraction, by estimation, is 60 to 65%. The  ?left ventricle has normal function. There is mild left ventricular  ?hypertrophy.  ? 2. Peak RV-RA gradient 30 mmHg. There is a 1.6 x 1 cm mobile vegetation  ?attached to the pacemaker in the RA. Right ventricular systolic function  ?is normal. The right ventricular size is mildly enlarged.  ? 3. Left atrial size was mildly dilated. No left atrial/left atrial  ?appendage thrombus was detected.  ? 4. Right atrial size was moderately dilated.  ? 5. The mitral valve is normal in structure. Mild to moderate mitral valve  ?regurgitation. No evidence of mitral stenosis.  ? 6. There is mobile vegetation on the tricuspid valve. The tricuspid valve  ?is abnormal. Tricuspid valve regurgitation is moderate.  ? 7. The aortic valve is tricuspid. Aortic valve regurgitation is not  ?visualized. Aortic valve sclerosis/calcification is present, without any  ?evidence of aortic stenosis.  ? 8. Grade 3 plaque in the descending thoracic aorta.  ? 9. No PFO or ASD by color doppler.  ?  ?[place] 08/2020 ?1. Low/normal filling pressures.  ?2. Very mild pulmonary hypertension.  ?3. Preserved cardiac output.  ?  ?Given ongoing dyspnea, needs pulmonary evaluation.  ? ?Patient Profile  ?   ?76 y.o. 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 ?Assessment & Plan  ?  ?Symptomatic orthostasis/near syncope ?- worse in the setting of recent COVID infection, poor oral intake and diuretic use ?- torsemide and spiro held on admission ?- Echo this admission showed LVEF 60-65%, no WMA, mild LVH, G1DD, mildly elevated pulmonary artery pressure, moderately dilated LA, mod TR ?- continue midodrine to '10mg'$  TID ?- continue abdominal binder and compression socks ?- Bps overall  better, but orthostatics still with a drop upon standing ?- [place] turned on minute ventilation which should help with symptoms ?- plan to discharge with lower torsemide dose '20mg'$  daily ?  ?Pulmonary HTN ?- she was following advanced heart failure clinic.  ?- continue PTA medications.  ?- restart Torsemide at lower dose as above.  ?  ?Recent COVID infection ?-infection 2-3 weeks ago ? ?Paroxysmal Afib ?- remains in NSR ?- continue amiodarone, digoxin ?- Eliquis for stroke ppx ?  ?ILD with chronic respiratory failure ?- she is on 4-5LO2 ?- she is on amiodarone, we will continue this for afib ?  ? ?For questions or updates, please contact [place] ?Please consult [email or web address] for contact info under  ? ?  ?   ?Signed, ?Jameon Deller Ninfa Meeker, PA-C  ?08/27/2021, 9:13 AM    ?

## 2021-08-27 NOTE — Telephone Encounter (Signed)
Margaret from [facility] called about verbal orders that have not been faxed back  ?

## 2021-08-27 NOTE — Progress Notes (Signed)
Pts home medications returned to pt ?

## 2021-08-27 NOTE — Progress Notes (Signed)
Pt left without discharge instructions/ Lysbeth Galas, pts daughter called and discharged instructions explained over the phone/ verbalized an understanding/ per pts daughter- she will come pick up written discharge instructions tomorrow.    ?

## 2021-08-27 NOTE — TOC Transition Note (Addendum)
Transition of Care (TOC) - CM/SW Discharge Note ? ? ?Patient Details  ?Name: Debra Saunders ?MRN: [id number] ?Date of Birth: July 29, 1945 ? ?Transition of Care (TOC) CM/SW Contact:  ?Alberteen Sam, LCSW ?Phone Number: ?08/27/2021, 9:29 AM ? ? ?Clinical Narrative:    ? ?Patient to discharge home today, is set up with [facility] PT and OT through [place], [place] with [place] informed of dc today and orders are in.  ? ?CSW spoke with patient's daughter [name] who reports she will be at medical mall main entrance to pick patient up at 2pm, RN and tech informed.  ? ?Confirmed with daughter aware to bring home oxygen upon pick up.  ? ?No other dc needs identified.  ? ?Final next level of care: [place] ?Barriers to Discharge: No Barriers Identified ? ? ?Patient Goals and CMS Choice ?Patient states their goals for this hospitalization and ongoing recovery are:: to go home ?CMS Medicare.gov Compare Post Acute Care list provided to:: Patient ?Choice offered to / list presented to : Patient ? ?Discharge Placement ?  ?           ?  ?Patient to be transferred to facility by: daughter ?Name of family member notified: daughter [name] ?Patient and family notified of of transfer: 08/27/21 ? ?Discharge Plan and Services ?  ?  ?           ?  ?  ?  ?  ?  ?HH Arranged: PT, OT ?[place] Agency: [place] ?Date HH Agency Contacted: 08/27/21 ?Time [place]: [phone] ?Representative spoke with at [place]: [place] ? ?Social Determinants of Health (SDOH) Interventions ?  ? ? ?Readmission Risk Interventions ?No flowsheet data found. ? ? ? ? ?

## 2021-08-28 ENCOUNTER — Other Ambulatory Visit (HOSPITAL_COMMUNITY): Payer: Self-pay

## 2021-08-28 ENCOUNTER — Encounter (HOSPITAL_COMMUNITY): Payer: Self-pay

## 2021-08-28 ENCOUNTER — Telehealth: Payer: Self-pay

## 2021-08-28 MED ORDER — DIGOXIN 125 MCG PO TABS: 0.0625 mg | ORAL_TABLET | Freq: Every day | ORAL | Status: DC

## 2021-08-28 NOTE — Telephone Encounter (Signed)
-----   Message from Jeannette How sent at 08/28/2021  9:41 AM EST ----- ?Regarding: FW: Digoxin dosing ? ?----- Message ----- ?From: Nelva Bush, MD ?Sent: 08/27/2021   9:24 PM EST ?To: Cadence Ninfa Meeker, PA-C, [facility] [place] Triage ?Subject: Digoxin dosing                                ? ?Hello, ? ?Ms. Sorenson was discharged from [facility] today.  I noticed this evening that her digoxin level has been trending up over the last few days.  Could you reach out to her and ask her to decrease digoxin to 0.0625 mg daily?  That is half of the dose that she had been taking.  We should have a follow-up digoxin level and BMP in about a week.  Please let me know if any questions or concerns come up.  Thanks. ? ?Nelva Bush, MD ?[facility] ? ? ? ? ?

## 2021-08-28 NOTE — Progress Notes (Signed)
Had a home visit with [place] due to fixing her med boxes with correct dosages from her discharge from hospital.  Took out her potassium and changed her dose of torsemide, gabapentin and digoxin.  She has all her medications.  Working with [name] and her family about transportation to all her appts coming up.  Switched her infectious disease to 17th from 15th to give her 1 trip to [place], sent social worker in [place] about getting her transportation.  Her daughter states she will be taking her tomorrow to Dr Caryl Comes appt and they have scheduled another transportation for her PCP appt on Friday.  She states doing ok, still a lot of dizziness.  Her daughter is staying with her around the clock to help her right now.  Appetite is good.  She is trying to watch high sodium foods and we did discuss some foods that are high.  She is aware to be cautious with fluids.  She is weighing and weight was unchanged today.  Hospital has set up home health for nursing, PT and OT.  She has not heard form them yet.  Printed her a copy of her discharge papers for her, she was not provided a copy. She denies any chest pain or increased shortness of breath. She complains of still not being able to hear well and hears her heartbeat, she states this aggravates her more than anything. Her daughter states that she thought someone was banging on the door its so loud.   Will continue to visit for heart failure, diet and medication compliance.  ? ?Lilliah Priego ?[place] EMT-Paramedic ?[phone] ?

## 2021-08-28 NOTE — Telephone Encounter (Signed)
Transition Care Management Follow-up Telephone Call ?Date of discharge and from where: 08/27/21 [facility] ?How have you been since you were released from the hospital? Information received from daughter, HIPAA compliant. Difficulty hearing. Had dizziness when first discharged but none today. The only issue is hearing loss.  Still a little weak and tired. Plans to schedule with otolaryngology as needed.  Wearing TEDhose. Walker in use and family close by to prevent falls. No BP readings to report.  ?Any questions or concerns? No ? ?Items Reviewed: ?Did the pt receive and understand the discharge instructions provided? Yes  ?Medications obtained and verified? Yes  ?Any new allergies since your discharge? No  ?Dietary orders reviewed? Yes ?Do you have support at home? Yes  ? ?Home Care and Equipment/Supplies: ?Were home health services ordered? Discharged on [facility] PT/OT. Not yet started.  ? ?Functional Questionnaire: (I = Independent and D = Dependent) ?ADLs: Family assist as needed ? ?Maintaining continence- I ? ?Transferring/Ambulation- [place] Rile ? ?Managing Meds- daughter managing  ? ?Follow up appointments reviewed: ? ?PCP Hospital f/u appt confirmed? Yes  Scheduled to see PCP  on 08/30/21  @ 10:00. ?[facility] f/u appt confirmed? Plans to schedule with Infectious Disease and Cardiology.  ?Are transportation arrangements needed? No  ?If their condition worsens, is the pt aware to call PCP or go to the Emergency Dept.? Yes ?Was the patient provided with contact information for the PCP's office or ED? Yes ?Was to pt encouraged to call back with questions or concerns? Yes  ?

## 2021-08-28 NOTE — Telephone Encounter (Signed)
Called Debra Saunders EMT as she is scheduled to go see the patient at home today and relayed Dr. Marisue Humble recommendation. [name] will be setting up her pill box and make sure that patient will take 0.0625 mg daily. She informed me that the patient has a hard time coordinating transportation and might have a hard time getting here for a lab draw next week. Patient does have an appointment on 09/06/21 at [facility], and [name] will try and coordinate the digoxin level and BMP to be drawn at that follow up visit. She will call me when she is with the patient today to solidify our POC. ?

## 2021-08-28 NOTE — Telephone Encounter (Signed)
[name] called from patients home and informed me that upon speaking with the patient, Dr. [name] had already cut patients Digoxin in half to the 0.0625 mg some time ago. Found in patients chart the following progress note from Dr. [name] on 07/11/21: Continue digoxin for RV support, decreased to 0.0625 daily.  ? ?Advised that [name] have patient skip her dose tonight, and we will clarify with her the dose she needs to be on when she is here in the office tomorrow for a Pacer check with Dr. Caryl Comes. Will route to Dr. Olin Pia nurse Alvis Lemmings RN. ? ?Will also message Oran Rein RN to help order a BMP and Digoxin level during patients appointment on 09/06/21 at [facility] under Dr. Marisue Humble name. ? ? ?

## 2021-08-28 NOTE — Telephone Encounter (Signed)
Thank you for the update.  It appears that Debra Saunders was receiving digoxin 0.125 mg daily in the hospital, which likely explains her rising level.  I agree with holding tonight's dose of digoxin and restarting 0.0625 mg daily tomorrow evening.  Follow-up digoxin level can be collected at heart failure appointment next week. ? ?Nelva Bush, MD ?[facility] ? ?

## 2021-08-28 NOTE — Progress Notes (Signed)
Seen where [facility] did not get her discharge paperwork.  Will print it for her and family and will see her at home this afternoon.  Notified [name] her daughter of same.  ? ?Varina Hulon ?[place] EMT-Paramedic ?[phone] ?

## 2021-08-29 ENCOUNTER — Ambulatory Visit (INDEPENDENT_AMBULATORY_CARE_PROVIDER_SITE_OTHER): Payer: Medicare HMO | Admitting: Internal Medicine

## 2021-08-29 ENCOUNTER — Telehealth: Payer: Self-pay

## 2021-08-29 ENCOUNTER — Ambulatory Visit: Payer: Self-pay | Admitting: Pharmacist

## 2021-08-29 ENCOUNTER — Encounter: Payer: Self-pay | Admitting: Internal Medicine

## 2021-08-29 ENCOUNTER — Telehealth (HOSPITAL_COMMUNITY): Payer: Self-pay | Admitting: Licensed Clinical Social Worker

## 2021-08-29 ENCOUNTER — Other Ambulatory Visit: Payer: Self-pay

## 2021-08-29 VITALS — BP 140/60 | HR 60 | Ht 60.0 in | Wt 180.0 lb

## 2021-08-29 DIAGNOSIS — I495 Sick sinus syndrome: Secondary | ICD-10-CM | POA: Diagnosis not present

## 2021-08-29 DIAGNOSIS — Z79899 Other long term (current) drug therapy: Secondary | ICD-10-CM | POA: Diagnosis not present

## 2021-08-29 DIAGNOSIS — I428 Other cardiomyopathies: Secondary | ICD-10-CM | POA: Diagnosis not present

## 2021-08-29 DIAGNOSIS — I10 Essential (primary) hypertension: Secondary | ICD-10-CM

## 2021-08-29 DIAGNOSIS — D649 Anemia, unspecified: Secondary | ICD-10-CM

## 2021-08-29 DIAGNOSIS — J324 Chronic pansinusitis: Secondary | ICD-10-CM

## 2021-08-29 DIAGNOSIS — Z950 Presence of cardiac pacemaker: Secondary | ICD-10-CM

## 2021-08-29 DIAGNOSIS — I480 Paroxysmal atrial fibrillation: Secondary | ICD-10-CM

## 2021-08-29 DIAGNOSIS — I5032 Chronic diastolic (congestive) heart failure: Secondary | ICD-10-CM | POA: Diagnosis not present

## 2021-08-29 DIAGNOSIS — Z8679 Personal history of other diseases of the circulatory system: Secondary | ICD-10-CM

## 2021-08-29 LAB — CUP PACEART REMOTE DEVICE CHECK
Battery Remaining Longevity: 60 mo
Battery Remaining Percentage: 65 %
Brady Statistic RA Percent Paced: 99 %
Brady Statistic RV Percent Paced: 99 %
Date Time Interrogation Session: 20230308164100
Implantable Lead Implant Date: 20150720
Implantable Lead Implant Date: 20150720
Implantable Lead Location: 753859
Implantable Lead Location: 753860
Implantable Lead Model: 5076
Implantable Lead Model: 5076
Implantable Pulse Generator Implant Date: 20150720
Lead Channel Impedance Value: 359 Ohm
Lead Channel Impedance Value: 518 Ohm
Lead Channel Pacing Threshold Amplitude: 1.1 V
Lead Channel Pacing Threshold Pulse Width: 0.4 ms
Lead Channel Setting Pacing Amplitude: 2 V
Lead Channel Setting Pacing Amplitude: 2.4 V
Lead Channel Setting Pacing Pulse Width: 0.4 ms
Lead Channel Setting Sensing Sensitivity: 2.5 mV
Pulse Gen Serial Number: 390330

## 2021-08-29 NOTE — Telephone Encounter (Signed)
Reviewed. Plan to see tomorrow as scheduled.  ?

## 2021-08-29 NOTE — Chronic Care Management (AMB) (Signed)
?  Chronic Care Management  ? ?Note ? ?08/29/2021 ?Name: Debra Saunders MRN: [id number] DOB: 1946-02-26 ? ? ? ?Closing pharmacy CCM case at this time. . Patient has clinic contact information for future questions or concerns.  ? ?[name], PharmD, [place], CPP ?Clinical Pharmacist ?Therapist, music at [employer] ?[phone] ? ?

## 2021-08-29 NOTE — Telephone Encounter (Signed)
Left voicemail asking patient daughter [name] to return my call. Per Dr.Vu patient ok to do telehealth visit on 3/17. Patient will need CBC, CMP, CRP done prior to appointment on 3/17.  ?

## 2021-08-29 NOTE — Telephone Encounter (Signed)
CSW received request from [name] Paramedic stating patient needs transportation to appointment on 09-06-21 at HF clinic. CSW made arrangements through [facility] and will be picked up at 12:30 pm. CSW contacted patient's daughter to inform of ride and pick up time. CSW available as needed. [name] Sarna, [place], [place] ? ?

## 2021-08-29 NOTE — Progress Notes (Signed)
Patient Care Team: [name], MD as PCP - General (Family Medicine) Wellington Hampshire, MD as PCP - Cardiology (Cardiology) Deboraha Sprang, MD as PCP - Electrophysiology (Cardiology) Thom Chimes, MD as Referring Physician (Allergy) Juanito Doom, MD as Referring Physician (Internal Medicine) De Hollingshead, RPH-CPP as Pharmacist (Pharmacist) Cammie Sickle, MD as Consulting Physician (Hematology and Oncology)   HPI  Debra Saunders is a 76 y.o. female Seen in followup for [employer] pacemaker implanted 7/15 for bradycardia and chronotropic incompetence.   Atrial fib on Apixaban iron deficiency anemia with a saturation of 10% 1/23 Hypertension with orthostatic hypotension   Chronic dyspnea.  Mostly nonambulatory.  Pulmonary hypertension has been improved with therapy.  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  And was [facility] to [facility] for consideration of extraction.  It was felt that she was not a candidate that she was to be treated with 6 weeks of antibiotics.  Long term suppressive antibiotics have been initiated.  Hospitalization complicated by atrial fibrillation for which she underwent TEE guided cardioversion and therapy with amiodarone.  Hospitalized again with dizziness.  Found to be orthostatic.  Started on midodrine; no appreciable benefit.    Issues in the past with amiodarone have been concerns regarding lung disease underlying.  She is being treated for pulmonary hypertension with sildenafil and macitentan   Some palpitations. DATE TEST EF    4/19 LHC   No obstruc CAD  3/21 Echo  60-65% Pa PRESS 50   1/23 Echo  60-65% RVSP 51 RV faiklure    Date Cr K Hgb TSH  LFTs  2/21 1.16 3.8 8.3     1/23 1.29 3.7  8.6 (iron sat  9%) 0.054 16          Records and Results Reviewed   Past Medical History:  Diagnosis Date   Arthritis    Atrial fibrillation, persistent ([place])    a. s/p TEE-guided DCCV on 08/20/2017   Cardiorenal syndrome    a. 09/2017 Creat rose to 2.7 w/ diuresis-->HD x 2.   Chest pain    a. 01/2012 Cath: nonobs dzs;  c. 04/2014 MV: No ischemia.   Chronic back pain    Chronic combined systolic (congestive) and diastolic (congestive) heart failure ([place])    a. Dx 2005 @ [facility];  b. 02/2014 Echo: EF 55-60%; c. 07/2017: EF 30-35%, diffuse HK, trivial AI, severe MR, moderate TR; d. 09/2017 Echo: EF 40-45%; e. 02/2018 Echo: EF 55-60%, Gr1 DD; f. 08/2019 Echo: EF 60-65%, Gr2 DD, Nl RV fxn, RVSP 49.31mHg. Mod-Sev MR. Mild to mod TR. Mod PR. RA ~ 89mg.   Chronic respiratory failure (HCC)    a. on home O2   Gastritis    History of intracranial hemorrhage    a. 6/14 described by neurosurgery as "small frontal posttraumatic SAH " - pt was on xarelto @ the time.   Hyperlipidemia    Hypertension    Interstitial lung disease (HCC)    Lipoma of back    Lymphedema    a. Chronic LLE edema.   [place]IC[place] ([place]mic cardiomyopathy) ([place]   a. 09/2017 Echo: EF 40-45%; b. 09/2017 Cath: minor, insignificant CAD; c. 02/2018 Echo: EF 55-60%, Gr1 DD; d. 08/2019  Echo: EF 60-65%, Gr2 DD.   Obesity    PAH (pulmonary artery hypertension) ([place])    a. 09/2017 Echo: PASP 95mHg; b. 09/2017 RHC: RA 23, RV 84/13, PA 84/29, PCWP 20, CO 3.89, CI 1.89. LVEDP 18.   Presence of permanent cardiac pacemaker 01/09/2014   Sepsis with metabolic encephalopathy ([place]    Sleep apnea    Streptococcal bacteremia    Stroke ([facility] 2014   [place]   Symptomatic bradycardia    a. s/p BSX PPM.   Thyroid nodule    Type II diabetes mellitus ([place]    Urinary incontinence     Past Surgical History:  Procedure Laterality Date   ABDOMINAL HYSTERECTOMY  1969   BREAST  BIOPSY Right 10/28/2019   lymph node [place]x, [place], [place]    CARDIAC CATHETERIZATION  2013   [place] No obstructive CAD: Only 20% ostial left circumflex.   CARDIOVERSION N/A 10/12/2017   Procedure: CARDIOVERSION;  Surgeon: GMinna Merritts MD;  [place] [facility];  Service: Cardiovascular;  Laterality: N/A;   CARDIOVERSION N/A 07/04/2021   Procedure: CARDIOVERSION;  Surgeon: MLarey Dresser MD;  [facility];  Service: Cardiovascular;  Laterality: N/A;   CATARACT EXTRACTION W/PHACO Right 09/19/2020   Procedure: CATARACT EXTRACTION PHACO AND INTRAOCULAR LENS PLACEMENT (IOC) RIGHT DIABETIC 3.95 00:43.0 9.2%;  Surgeon: BLeandrew Koyanagi MD;  [place]  Service: Ophthalmology;  Laterality: Right;  Pt requests to be last sleep apnea   CATARACT EXTRACTION W/PHACO Left 10/03/2020   Procedure: CATARACT EXTRACTION PHACO AND INTRAOCULAR LENS PLACEMENT ([place] LEFT DIABETIC;  Surgeon: BLeandrew Koyanagi MD;  [place]  Service: Ophthalmology;  Laterality: Left;  3.25 0:59.3 5.5%   CHOLECYSTECTOMY     COLONOSCOPY WITH PROPOFOL N/A 09/10/2018   Procedure: COLONOSCOPY WITH PROPOFOL;  Surgeon: EManya Silvas MD;  [facility];  Service: Endoscopy;  Laterality: N/A;   CYST REMOVAL TRUNK  2002   BACK   DIALYSIS/PERMA CATHETER INSERTION N/A 10/06/2017   Procedure: DIALYSIS/PERMA CATHETER INSERTION;  Surgeon: SKatha Cabal MD;  [place] [facility];  Service: Cardiovascular;  Laterality: N/A;   INSERT / REPLACE / REMOVE PACEMAKER     lipoma removal  2014   back   PACEMAKER INSERTION  2015   [employer] dual chamber pacemaker implanted by Dr KCaryl Comesfor symptomatic bradycardia   PERMANENT PACEMAKER INSERTION N/A 01/09/2014   Procedure: PERMANENT PACEMAKER INSERTION;  Surgeon: SDeboraha Sprang MD;  [facility];  Service: Cardiovascular;  Laterality: N/A;   RIGHT HEART CATH N/A 04/19/2018   Procedure: RIGHT  HEART CATH;  Surgeon: MLarey Dresser MD;  [place];  Service: Cardiovascular;  Laterality: N/A;   RIGHT HEART CATH N/A 08/30/2020   Procedure: RIGHT HEART CATH;  Surgeon: MLarey Dresser MD;  [place];  Service: Cardiovascular;  Laterality: N/A;   RIGHT/LEFT HEART CATH AND CORONARY ANGIOGRAPHY N/A 10/19/2017   Procedure: RIGHT/LEFT HEART CATH AND CORONARY ANGIOGRAPHY;  Surgeon: AWellington Hampshire MD;  [place];  Service: Cardiovascular;  Laterality: N/A;   TEE WITHOUT CARDIOVERSION N/A 08/20/2017   Procedure: TRANSESOPHAGEAL ECHOCARDIOGRAM (TEE) & Direct current cardioversion;  Surgeon: GMinna Merritts MD;  [facility];  Service: Cardiovascular;  Laterality: N/A;   TEE WITHOUT CARDIOVERSION N/A 06/28/2021   Procedure: TRANSESOPHAGEAL ECHOCARDIOGRAM (TEE);  Surgeon: GMinna Merritts MD;  Location: [facility];  Service: Cardiovascular;  Laterality:  N/A;   TEE WITHOUT CARDIOVERSION N/A 07/04/2021   Procedure: TRANSESOPHAGEAL ECHOCARDIOGRAM (TEE);  Surgeon: Larey Dresser, MD;  [facility];  Service: Cardiovascular;  Laterality: N/A;   TRIGGER FINGER RELEASE      Current Meds  Medication Sig   acetaminophen (TYLENOL) 325 MG tablet Take 325-650 mg by mouth every 6 (six) hours as needed for mild pain.   albuterol (ACCUNEB) 0.63 MG/3ML nebulizer solution Take 1 ampule by nebulization every 6 (six) hours as needed for wheezing.   amiodarone (PACERONE) 200 MG tablet Take 1 tablet (200 mg total) by mouth daily.   apixaban (ELIQUIS) 5 MG TABS tablet Take 5 mg by mouth 2 (two) times daily.   benzonatate (TESSALON) 200 MG capsule Take 1 capsule (200 mg total) by mouth 3 (three) times daily as needed for cough.   cefadroxil (DURICEF) 500 MG capsule Take 1 capsule (500 mg total) by mouth 2 (two) times daily.   Difluprednate 0.05 % EMUL Place 1 drop into both eyes 2 (two) times daily.   digoxin (LANOXIN) 0.125 MG tablet Take  0.5 tablets (0.0625 mg total) by mouth daily.   DULoxetine (CYMBALTA) 60 MG capsule Take 60 mg by mouth daily.   fluticasone (FLONASE) 50 MCG/ACT nasal spray Place 2 sprays into both nostrils daily for 10 days.   gabapentin (NEURONTIN) 300 MG capsule Take 600 mg by mouth 3 (three) times daily.   macitentan (OPSUMIT) 10 MG tablet Take 1 tablet (10 mg total) by mouth daily.   meclizine (ANTIVERT) 25 MG tablet Take 1 tablet (25 mg total) by mouth 3 (three) times daily as needed for dizziness.   Menthol, Topical Analgesic, (BIOFREEZE EX) Apply 1 application topically 4 (four) times daily as needed (for pain).   midodrine (PROAMATINE) 10 MG tablet Take 1 tablet (10 mg total) by mouth 3 (three) times daily with meals.   rosuvastatin (CRESTOR) 10 MG tablet Take 1 tablet by mouth once daily   Selexipag (UPTRAVI) 1600 MCG TABS Take 1 tablet (1,600 mcg total) by mouth 2 (two) times daily.   sildenafil (REVATIO) 20 MG tablet Take 20 mg by mouth 3 (three) times daily.   spironolactone (ALDACTONE) 25 MG tablet Take 12.5 mg by mouth daily.   torsemide (DEMADEX) 20 MG tablet Take 1 tablet (20 mg total) by mouth daily. Dose reduced from 60 mg twice daily.    Allergies  Allergen Reactions   Aspirin Anaphylaxis and Hives    Pt states that she is unable to take because she had bleeding in her brain   Other Other (See Comments)    Pt states that she is unable to take blood thinners because she had bleeding in her brain; Blood Thinners-per doctor at [facility] (Xarelto)      Review of Systems negative except from HPI and PMH  Physical Exam BP 140/60 (BP Location: Right Arm, Patient Position: Sitting, Cuff Size: Normal)    Pulse 60    Ht 5' (1.524 m)    Wt 180 lb (81.6 kg)    SpO2 92% Comment: 4 Liters of Oxygen   BMI 35.15 kg/m  Well developed and well nourished in no acute distress HENT normal E scleral and icterus clear Neck Supple JVP flat;   Clear to ausculation Regular rate and rhythm, 3/6  systolic murmur Soft with active bowel sounds No clubbing cyanosis 3+ Edema left 1+ on the right Alert and oriented, grossly normal motor and sensory function Skin Warm and Dry  ECG AV pacing  at 60 Intervals 20/16/46  Estimated Creatinine Clearance: 35.4 mL/min (A) (by C-G formula based on SCr of 1.24 mg/dL (H)).   Assessment and  Plan  Chronotropic incompetence   Sinus bradycardia  Atrial fibrillation-persistent requiring cardioversion   Atrial tachycardia non sustained with rates 200 msec (ie nonsustained AFib)   Dyspnea on exertion   Cardiac implantable device infection-Streptococcus-chronic  Pacemaker [employer]      HFpEF   Hypertension and orthostatic hypotension   Anemia-iron deficiency  Hyperthyroidism  High risk medication surveillance-amiodarone  End-of-life discussion   The patient hospitalized with endocarditis and vegetation on her lead.  Streptococcus.  Not a candidate for extraction and so has been submitted for lifelong antibiotics.  Tolerating the most far.  On amiodarone and holding sinus rhythm.  No cough.  Reviewed the risks and benefits and the issues of lung injury.  We will continue at 200 mg a day.  Her TSH was low at initiation.  We will check it again today.  She has been iron deficient.  With her heart failure, she may need more iron replacement.  We will recheck her saturation.  Orthostatic hypotension has been a problem. We discussed the physiology of orthostatic intolerance including gravitational fluid shifts and the impact of hypertensive vascular disease on orthostasis and treatment options.  We discussed pharmacological options including midodrine.  We discussed nonpharmacological options including raising the HOB, isometric contraction upon standing, abdominal binders  thigh sleeves. We emphasized the importance of recognizing the prodrome and sitting prior to falling, safety in the shower and in the bathroom and the avoidance  of dehydration   We will continue with pharmacological approach with using ProAmatine although she has not benefited significantly.  Would have a low threshold for stopping it; moreover, given the fact that her blood pressures are much lower, she might actually benefit from fludrocortisone but would be worried about supine hypertension and would probably try pyridostigmine    We also reviewed her CODE STATUS.  It is her desire to be a code.  We discussed the issue about the possibility of being able to be weaned from the ventilator given her lung disease.  I have suggested that she review this with her pulmonologist  Discussed with Dr. DM, we will discontinue digoxin at this point as levels have been elevated in the amiodarone is accumulating and concerns about therapeutic risk benefit     Current medicines are reviewed at length with the patient today .  The patient does not  have concerns regarding medicines.

## 2021-08-29 NOTE — Patient Instructions (Addendum)
Medication Instructions:  ?- Your physician has recommended you make the following change in your medication:  ? ?1) STOP Digoxin ? ?*If you need a refill on your cardiac medications before your next appointment, please call your pharmacy* ? ? ?Lab Work: ?- Your physician recommends that you have lab work today: TSH/ Free T3/ Free T4/ CMET/ Iron/ T-IBC/ Ferritin ? ?If you have labs (blood work) drawn today and your tests are completely normal, you will receive your results only by: ?MyChart Message (if you have MyChart) OR ?A paper copy in the mail ?If you have any lab test that is abnormal or we need to change your treatment, we will call you to review the results. ? ? ?Testing/Procedures: ?- none ordered ? ? ?Follow-Up: ?At [facility], you and your health needs are our priority.  As part of our continuing mission to provide you with exceptional heart care, we have created designated Provider Care Teams.  These Care Teams include your primary Cardiologist (physician) and Advanced Practice Providers (APPs -  Physician Assistants and Nurse Practitioners) who all work together to provide you with the care you need, when you need it. ? ?We recommend signing up for the patient portal called "MyChart".  Sign up information is provided on this After Visit Summary.  MyChart is used to connect with patients for Virtual Visits (Telemedicine).  Patients are able to view lab/test results, encounter notes, upcoming appointments, etc.  Non-urgent messages can be sent to your provider as well.   ?To learn more about what you can do with MyChart, go to [email or web address].   ? ?Your next appointment:   ?6 month(s) ? ?The format for your next appointment:   ?In Person ? ?Provider:   ?Virl Axe, MD  ? ? ?Other Instructions ?N/a ? ?

## 2021-08-30 ENCOUNTER — Encounter: Payer: Self-pay | Admitting: Family Medicine

## 2021-08-30 ENCOUNTER — Ambulatory Visit: Payer: Medicare HMO

## 2021-08-30 ENCOUNTER — Other Ambulatory Visit: Payer: Self-pay

## 2021-08-30 ENCOUNTER — Ambulatory Visit (INDEPENDENT_AMBULATORY_CARE_PROVIDER_SITE_OTHER): Payer: Medicare HMO | Admitting: Family Medicine

## 2021-08-30 VITALS — BP 130/70 | HR 62 | Temp 98.5°F | Ht 60.0 in | Wt 180.4 lb

## 2021-08-30 DIAGNOSIS — T827XXA Infection and inflammatory reaction due to other cardiac and vascular devices, implants and grafts, initial encounter: Secondary | ICD-10-CM | POA: Diagnosis not present

## 2021-08-30 DIAGNOSIS — S0990XA Unspecified injury of head, initial encounter: Secondary | ICD-10-CM | POA: Diagnosis not present

## 2021-08-30 DIAGNOSIS — I951 Orthostatic hypotension: Secondary | ICD-10-CM

## 2021-08-30 DIAGNOSIS — R42 Dizziness and giddiness: Secondary | ICD-10-CM

## 2021-08-30 DIAGNOSIS — Z8679 Personal history of other diseases of the circulatory system: Secondary | ICD-10-CM

## 2021-08-30 DIAGNOSIS — J324 Chronic pansinusitis: Secondary | ICD-10-CM

## 2021-08-30 LAB — COMPREHENSIVE METABOLIC PANEL
ALT: 22 IU/L (ref 0–32)
AST: 19 IU/L (ref 0–40)
Albumin/Globulin Ratio: 1.1 — ABNORMAL LOW (ref 1.2–2.2)
Albumin: 3.8 g/dL (ref 3.7–4.7)
Alkaline Phosphatase: 266 IU/L — ABNORMAL HIGH (ref 44–121)
BUN/Creatinine Ratio: 25 (ref 12–28)
BUN: 27 mg/dL (ref 8–27)
Bilirubin Total: 0.2 mg/dL (ref 0.0–1.2)
CO2: 27 mmol/L (ref 20–29)
Calcium: 9.3 mg/dL (ref 8.7–10.3)
Chloride: 99 mmol/L (ref 96–106)
Creatinine, Ser: 1.07 mg/dL — ABNORMAL HIGH (ref 0.57–1.00)
Globulin, Total: 3.4 g/dL (ref 1.5–4.5)
Glucose: 113 mg/dL — ABNORMAL HIGH (ref 70–99)
Potassium: 4.7 mmol/L (ref 3.5–5.2)
Sodium: 139 mmol/L (ref 134–144)
Total Protein: 7.2 g/dL (ref 6.0–8.5)
eGFR: 53 mL/min/{1.73_m2} — ABNORMAL LOW (ref >59)

## 2021-08-30 LAB — CBC WITH DIFFERENTIAL/PLATELET
Basophils Absolute: 0 10*3/uL (ref 0.0–0.1)
Basophils Relative: 0.6 % (ref 0.0–3.0)
Eosinophils Absolute: 0.3 10*3/uL (ref 0.0–0.7)
Eosinophils Relative: 3.2 % (ref 0.0–5.0)
HCT: 29.6 % — ABNORMAL LOW (ref 36.0–46.0)
Hemoglobin: 9.4 g/dL — ABNORMAL LOW (ref 12.0–15.0)
Lymphocytes Relative: 17.2 % (ref 12.0–46.0)
Lymphs Abs: 1.4 10*3/uL (ref 0.7–4.0)
MCHC: 31.6 g/dL (ref 30.0–36.0)
MCV: 85 fl (ref 78.0–100.0)
Monocytes Absolute: 0.5 10*3/uL (ref 0.1–1.0)
Monocytes Relative: 6.3 % (ref 3.0–12.0)
Neutro Abs: 5.8 10*3/uL (ref 1.4–7.7)
Neutrophils Relative %: 72.7 % (ref 43.0–77.0)
Platelets: 293 10*3/uL (ref 150.0–400.0)
RBC: 3.48 Mil/uL — ABNORMAL LOW (ref 3.87–5.11)
RDW: 15.1 % (ref 11.5–15.5)
WBC: 7.9 10*3/uL (ref 4.0–10.5)

## 2021-08-30 LAB — TSH: TSH: 0.763 u[IU]/mL (ref 0.450–4.500)

## 2021-08-30 LAB — IRON,TIBC AND FERRITIN PANEL
Ferritin: 877 ng/mL — ABNORMAL HIGH (ref 15–150)
Iron Saturation: 14 % — ABNORMAL LOW (ref 15–55)
Iron: 43 ug/dL (ref 27–139)
Total Iron Binding Capacity: 308 ug/dL (ref 250–450)
UIBC: 265 ug/dL (ref 118–369)

## 2021-08-30 LAB — C-REACTIVE PROTEIN: CRP: 2.3 mg/dL (ref 0.5–20.0)

## 2021-08-30 LAB — T4, FREE: Free T4: 1.42 ng/dL (ref 0.82–1.77)

## 2021-08-30 LAB — T3, FREE: T3, Free: 1.6 pg/mL — ABNORMAL LOW (ref 2.0–4.4)

## 2021-08-30 NOTE — Patient Instructions (Signed)
Nice to see you. ?We will get a CT scan scheduled.  We will get blood work today.  If you develop headaches, numbness, weakness, vision changes, or any other new symptoms you need to seek medical attention. ?

## 2021-08-30 NOTE — Assessment & Plan Note (Signed)
The patient was found to be orthostatic in the hospital.  She continues to have issues with lightheadedness so that issue may not be related to orthostasis as her blood pressures have seemed to improve with the midodrine.  She will continue to follow with cardiology. ?

## 2021-08-30 NOTE — Assessment & Plan Note (Addendum)
Her fall was likely multifactorial with her dizziness playing a role along with mechanical factors.  Discussed the need for CT imaging of her head given the head injury on anticoagulation.  Patient notes she cannot do this today given transportation issues.  We will try to get this scheduled for soon as possible.  She will seek medical attention emergency department for any neurological symptoms or headaches. ?

## 2021-08-30 NOTE — Assessment & Plan Note (Signed)
This is an ongoing issue.  Orthostasis could be contributing though blood pressure seem to have improved and she still has dizziness.  She needs to follow-up with ENT for further evaluation particularly in the setting of her left hearing loss.  I will refer to ENT. ?

## 2021-08-30 NOTE — Progress Notes (Signed)
?[name], MD ?Phone: [phone] ? ?Debra Saunders is a 76 y.o. female who presents today for hospital follow-up. ? ?Lightheadedness: The patient was recently hospitalized for lightheadedness.  She was found to have a sinus infection and was treated with IV ceftriaxone and then transition back to her oral antibiotics which she has been on for endocarditis and a pacemaker infection.  She had a BPPV evaluation with PT that was negative.  She was found to be orthostatic and thus her torsemide and Aldactone were stopped those those were resumed at discharge.  Her midodrine was increased to 10 mg 3 times daily.  ENT recommended outpatient follow-up.  She has seen one of her cardiologist and has follow-up with the other one next week.  She has follow-up with infectious disease next week as well.  They note the lightheadedness can occur at any time and it particularly occurs with head movements.  They note no syncope.  She does not remember what her blood pressures been recently though her daughter notes they have not been elevated and they have not been low.  She notes some left-sided hearing loss. ? ?Endocarditis: Patient remains on oral antibiotics.  She has follow-up with infectious disease next week. ? ?Fall: Patient reports a fall yesterday.  She tried to get into bed and slipped on the stairs that she uses to get into bed.  She fell backwards and did hit the back of her head.  She notes her leg hurt at the time but does not hurt now.  She has not had any new headaches since hitting her head.  She is on Eliquis. ? ?Social History  ? ?Tobacco Use  ?Smoking Status Former  ? Packs/day: 0.30  ? Years: 2.00  ? Pack years: 0.60  ? Types: Cigarettes  ? Quit date: 06/23/1990  ? Years since quitting: 31.2  ?Smokeless Tobacco Never  ? ? ?Current Outpatient Medications on File Prior to Visit  ?Medication Sig Dispense Refill  ? acetaminophen (TYLENOL) 325 MG tablet Take 325-650 mg by mouth every 6 (six) hours as needed  for mild pain.    ? albuterol (ACCUNEB) 0.63 MG/3ML nebulizer solution Take 1 ampule by nebulization every 6 (six) hours as needed for wheezing.    ? amiodarone (PACERONE) 200 MG tablet Take 1 tablet (200 mg total) by mouth daily. 30 tablet 6  ? apixaban (ELIQUIS) 5 MG TABS tablet Take 5 mg by mouth 2 (two) times daily.    ? benzonatate (TESSALON) 200 MG capsule Take 1 capsule (200 mg total) by mouth 3 (three) times daily as needed for cough. 20 capsule 0  ? cefadroxil (DURICEF) 500 MG capsule Take 1 capsule (500 mg total) by mouth 2 (two) times daily. 60 capsule 2  ? Difluprednate 0.05 % EMUL Place 1 drop into both eyes 2 (two) times daily.    ? DULoxetine (CYMBALTA) 60 MG capsule Take 60 mg by mouth daily.    ? fluticasone (FLONASE) 50 MCG/ACT nasal spray Place 2 sprays into both nostrils daily for 10 days.  2  ? gabapentin (NEURONTIN) 300 MG capsule Take 600 mg by mouth 3 (three) times daily.    ? macitentan (OPSUMIT) 10 MG tablet Take 1 tablet (10 mg total) by mouth daily. 90 tablet 3  ? meclizine (ANTIVERT) 25 MG tablet Take 1 tablet (25 mg total) by mouth 3 (three) times daily as needed for dizziness. 30 tablet 1  ? Menthol, Topical Analgesic, (BIOFREEZE EX) Apply 1 application topically 4 (four)  times daily as needed (for pain).    ? midodrine (PROAMATINE) 10 MG tablet Take 1 tablet (10 mg total) by mouth 3 (three) times daily with meals. 90 tablet 2  ? rosuvastatin (CRESTOR) 10 MG tablet Take 1 tablet by mouth once daily 90 tablet 0  ? Selexipag (UPTRAVI) 1600 MCG TABS Take 1 tablet (1,600 mcg total) by mouth 2 (two) times daily. 60 tablet 11  ? sildenafil (REVATIO) 20 MG tablet Take 20 mg by mouth 3 (three) times daily.    ? spironolactone (ALDACTONE) 25 MG tablet Take 12.5 mg by mouth daily.    ? torsemide (DEMADEX) 20 MG tablet Take 1 tablet (20 mg total) by mouth daily. Dose reduced from 60 mg twice daily. 30 tablet 2  ? ?No current facility-administered medications on file prior to visit.  ? ? ? ?ROS  see history of present illness ? ?Objective ? ?Physical Exam ?Vitals:  ? 08/30/21 0956  ?BP: 130/70  ?Pulse: 62  ?Temp: 98.5 ?F (36.9 ?C)  ?SpO2: 93%  ? ? ?BP Readings from Last 3 Encounters:  ?08/30/21 130/70  ?08/29/21 140/60  ?08/27/21 (!) 119/54  ? ?Wt Readings from Last 3 Encounters:  ?08/30/21 180 lb 6.4 oz (81.8 kg)  ?08/29/21 180 lb (81.6 kg)  ?08/28/21 173 lb (78.5 kg)  ? ? ?Physical Exam ?Constitutional:   ?   General: She is not in acute distress. ?   Appearance: She is not diaphoretic.  ?HENT:  ?   Head: Normocephalic and atraumatic.  ?   Right Ear: Tympanic membrane normal.  ?   Left Ear: Tympanic membrane normal.  ?Cardiovascular:  ?   Rate and Rhythm: Normal rate and regular rhythm.  ?   Heart sounds: Normal heart sounds.  ?Pulmonary:  ?   Effort: Pulmonary effort is normal.  ?   Breath sounds: Normal breath sounds.  ?Skin: ?   General: Skin is warm and dry.  ?Neurological:  ?   Mental Status: She is alert.  ?   Comments: EOMI, PERRL, opens and closes eyes adequately, V1 through V3 intact to light touch, hearing absent in the left ear, hearing intact to finger rub in the right ear, shoulder shrug intact, 5/5 strength in bilateral biceps, triceps, grip, quads, hamstrings, plantar and dorsiflexion, sensation to light touch intact in bilateral UE and LE  ? ? ? ?Assessment/Plan: Please see individual problem list. ? ?Problem List Items Addressed This Visit   ? ? Head injury - Primary  ?  Her fall was likely multifactorial with her dizziness playing a role along with mechanical factors.  Discussed the need for CT imaging of her head given the head injury on anticoagulation.  Patient notes she cannot do this today given transportation issues.  We will try to get this scheduled for soon as possible.  She will seek medical attention emergency department for any neurological symptoms or headaches. ?  ?  ? Relevant Orders  ? CT HEAD WO CONTRAST (5MM)  ? History of endocarditis  ?  The patient will continue on  cefadroxil.  She will follow-up with ID as planned.  We will obtain the CBC and CRP that they requested. ?  ?  ? Infection due to implanted cardiac device [facility])  ? Relevant Orders  ? CBC with Differential/Platelet  ? C-reactive protein  ? Orthostatic hypotension  ?  The patient was found to be orthostatic in the hospital.  She continues to have issues with lightheadedness so that issue may  not be related to orthostasis as her blood pressures have seemed to improve with the midodrine.  She will continue to follow with cardiology. ?  ?  ? Pansinusitis  ?  This is possibly contributing to her dizziness/lightheadedness.  She will continue the cefadroxil for her prior endocarditis. ?  ?  ? Severe dizziness  ?  This is an ongoing issue.  Orthostasis could be contributing though blood pressure seem to have improved and she still has dizziness.  She needs to follow-up with ENT for further evaluation particularly in the setting of her left hearing loss.  I will refer to ENT. ?  ?  ? Relevant Orders  ? Ambulatory referral to ENT  ? ?Return in about 3 months (around 11/30/2021). ? ?This visit occurred during the SARS-CoV-2 public health emergency.  Safety protocols were in place, including screening questions prior to the visit, additional usage of staff PPE, and extensive cleaning of exam room while observing appropriate contact time as indicated for disinfecting solutions.  ? ? ?[name], MD ?[place] ? ?

## 2021-08-30 NOTE — Assessment & Plan Note (Signed)
This is possibly contributing to her dizziness/lightheadedness.  She will continue the cefadroxil for her prior endocarditis. ?

## 2021-08-30 NOTE — Assessment & Plan Note (Signed)
The patient will continue on cefadroxil.  She will follow-up with ID as planned.  We will obtain the CBC and CRP that they requested. ?

## 2021-09-02 ENCOUNTER — Other Ambulatory Visit (HOSPITAL_COMMUNITY): Payer: Self-pay

## 2021-09-02 ENCOUNTER — Encounter (HOSPITAL_COMMUNITY): Payer: Self-pay

## 2021-09-02 NOTE — Progress Notes (Signed)
Today had a home visit with Debra Saunders.  Seen she had a fall and discussed needing help until her dizziness subsides.  Her daughter has been staying with her to help.  She is very independent and wants to do for herself.  She denies any headaches since fall.  She states a little sore in her body but that is it.  She has not got her amiodarone picked up that is ready at pharmacy.  She states will get them to pick up today.  She is out of it as in the morning.  She is aware.  Fixed her boxes except for amiodarone for 1 week.  Will visit again next week.  Will check with her tomorrow o make sure she has her medication.  They also have sildinifil and gabapentin ready.  Appetite has been good, her daughter been cooking.  She is gaining some weight back.  She denies any increased shortness of breath or chest pain.  She is aware of her HF clinic appt this week, transportation has been scheduled.  She has everything for daily living.  Lungs are clear.  Did orthostatics today, were negative.  Will continue to visit for heart failure, diet and medication management. ? ?[name] ? EMT-Paramedic ?[phone]  ?

## 2021-09-03 ENCOUNTER — Other Ambulatory Visit: Payer: Medicare HMO

## 2021-09-03 ENCOUNTER — Ambulatory Visit: Payer: Medicare HMO

## 2021-09-03 DIAGNOSIS — Z961 Presence of intraocular lens: Secondary | ICD-10-CM

## 2021-09-03 DIAGNOSIS — G9341 Metabolic encephalopathy: Secondary | ICD-10-CM

## 2021-09-03 DIAGNOSIS — I609 Nontraumatic subarachnoid hemorrhage, unspecified: Secondary | ICD-10-CM

## 2021-09-03 DIAGNOSIS — M545 Low back pain, unspecified: Secondary | ICD-10-CM | POA: Diagnosis not present

## 2021-09-03 DIAGNOSIS — Z87891 Personal history of nicotine dependence: Secondary | ICD-10-CM

## 2021-09-03 DIAGNOSIS — E785 Hyperlipidemia, unspecified: Secondary | ICD-10-CM

## 2021-09-03 DIAGNOSIS — R32 Unspecified urinary incontinence: Secondary | ICD-10-CM

## 2021-09-03 DIAGNOSIS — E041 Nontoxic single thyroid nodule: Secondary | ICD-10-CM

## 2021-09-03 DIAGNOSIS — I5042 Chronic combined systolic (congestive) and diastolic (congestive) heart failure: Secondary | ICD-10-CM | POA: Diagnosis not present

## 2021-09-03 DIAGNOSIS — T827XXA Infection and inflammatory reaction due to other cardiac and vascular devices, implants and grafts, initial encounter: Secondary | ICD-10-CM | POA: Diagnosis not present

## 2021-09-03 DIAGNOSIS — R6521 Severe sepsis with septic shock: Secondary | ICD-10-CM | POA: Diagnosis not present

## 2021-09-03 DIAGNOSIS — Z8673 Personal history of transient ischemic attack (TIA), and cerebral infarction without residual deficits: Secondary | ICD-10-CM

## 2021-09-03 DIAGNOSIS — N186 End stage renal disease: Secondary | ICD-10-CM | POA: Diagnosis not present

## 2021-09-03 DIAGNOSIS — E669 Obesity, unspecified: Secondary | ICD-10-CM

## 2021-09-03 DIAGNOSIS — A409 Streptococcal sepsis, unspecified: Secondary | ICD-10-CM | POA: Diagnosis not present

## 2021-09-03 DIAGNOSIS — Z9981 Dependence on supplemental oxygen: Secondary | ICD-10-CM

## 2021-09-03 DIAGNOSIS — E1122 Type 2 diabetes mellitus with diabetic chronic kidney disease: Secondary | ICD-10-CM | POA: Diagnosis not present

## 2021-09-03 DIAGNOSIS — I4819 Other persistent atrial fibrillation: Secondary | ICD-10-CM | POA: Diagnosis not present

## 2021-09-03 DIAGNOSIS — Z6835 Body mass index (BMI) 35.0-35.9, adult: Secondary | ICD-10-CM

## 2021-09-03 DIAGNOSIS — Z792 Long term (current) use of antibiotics: Secondary | ICD-10-CM

## 2021-09-03 DIAGNOSIS — I132 Hypertensive heart and chronic kidney disease with heart failure and with stage 5 chronic kidney disease, or end stage renal disease: Secondary | ICD-10-CM | POA: Diagnosis not present

## 2021-09-03 DIAGNOSIS — G4733 Obstructive sleep apnea (adult) (pediatric): Secondary | ICD-10-CM

## 2021-09-03 DIAGNOSIS — I429 Cardiomyopathy, unspecified: Secondary | ICD-10-CM

## 2021-09-03 DIAGNOSIS — J849 Interstitial pulmonary disease, unspecified: Secondary | ICD-10-CM

## 2021-09-03 DIAGNOSIS — Z7901 Long term (current) use of anticoagulants: Secondary | ICD-10-CM

## 2021-09-03 NOTE — Progress Notes (Signed)
?  ? ?Date:  09/06/2021  ? ?ID:  BERNELL HAYNIE, DOB 03-28-1946, MRN [id number]   ?Provider location: [facility] ?Type of Visit: Established patient  ? ?PCP:  [name], MD  ?Cardiologist:  [name], MD ?Primary HF: Dr. [name] ? ?Chief Complaint: post-hospital HF follow up ?  ?History of Present Illness: ?Debra Saunders is a 76 y.o. 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 ?  ?Admitted 10/24-10/29/19 with acute on chronic diastolic HF. HF team consulted with concerns for low output. PICC was placed and milrinone to support RV function was started along with IV Lasix to facilitate diuresis. RHC done to evaluate PAH (see below). V/Q scan negative. High res CT showed pulmonary fibrosis. Pulmonary consult and recommended stopping amiodarone. She was transitioned to torsemide 60 mg BID. DC weight: 226 lbs.  ?  ?She has started on selexipag for pulmonary hypertension in addition to Opsumit and Revatio.     ? ?Echo in 3/21 showed EF 60-65%, normal RV, PASP 50 mmHg, moderate-severe MR.  ? ?Had [place] 08/30/2020- low/normal filling pressures, mild pumonary htn, and preserved cardiac. output.  ? ?Admitted to [facility] 1/23 with endocarditis and PNA, requiring pressors. BCx positive for Streptococcus group G.  All her PAH meds were held. TEE showed a large vegetation on the pacer wire in the right atrium above the tricuspid valve (1 x 1.5 cm).  No vegetations noted on the valves.  EF was 55% and there was note of mild to moderate mitral regurgitation. RV ok. She was transferred to [facility] for pacemaker extraction. EP felt she was not a candidate due to multiple comorbidities. ID  consulted and planned for 6 weeks of IV abx. She developed Afib with RVR and started on amio. She underwent successful TEE-guided DCCV 1/22. Hospitalization complicated by anemia, hypotension, and AKI Spiro and KCL, midodrine started during admission. She was discharged to SNF, weight 198 lbs. ? ?Admitted 3/23 with lightheadedness. Treated for sinus infection, HF meds held due to othostasis. Midodrine increased to 10 mg tid. HH PT arranged. ? ?Today she returns for post hospital HF follow up with her daughter. She feels "awful." Remains fatigued and dyspneic with minimal activity. Consistently dizzy and had a fall recently. Feels like she has more fluid on board and has noticed palpitations. Continues with chronic LLE lymphedema. Denies CP, abnormal bleeding or PND/Orthopnea. Appetite ok. No fever or chills. Weight at home 180 pounds. Taking all medications.  ? ?Device interrogation: No recent AT/AF,> 99% v pacing (personally reviewed). ? ?ReDs: 41% ? ?6 minute walk (1/20): 98 m ?6 minute walk (7/20): 213 m ?6 minute walk (3/22): 198 m ?6 minute walk (10/22): 229 m ?  ?Labs (11/19): digoxin 0.8 ?Labs (1/20): K 4.2, creatinine 1.36 => 1.33, digoxin 0.8 ?Labs (2/20): digoxin 0.2, K 4.3, creatinine 1.32 ?Labs (5/20): K 4.5, creatinine 1.4, digoxin 0.8 ?Labs (1/22): K 4, creatinine 1.07 ?Labs (8/22): K 4.1, creatinine 1.03 ?Labs (1/23): K 3.7, creatinine 1.29 ?Labs (1/23): K 4.7, creatinine 1.07 ?Labs (3/23): K 4.3, creatinine 1.24 ? ?ECG (personally reviewed): none ordered today. ?  ?PMH: ?1. Lymphedema: Left lower leg (chronic).  ?2. COPD ?3. Pulmonary fibrosis: On home oxygen.  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  ?- PFTs (2017) with FEV1 64%, FEV1/FVC ratio 106%, TLC 49%, DLCO 39% (restrictive).  ?- CT chest high resolution (10/19): Pulmonary parenchymal pattern of fibrosis.  ?- CT chest (4/22): Chronic ILD ?4. Atrial fibrillation: Paroxysmal.  She was previously off amiodarone due to lung disease.  ?- 2/19 DCCV.  ?- 1/23  DCCV ?5. H/o DVT s/p IVC filter in 2004.  ?6. CAD: 4/19 coronary angiography with minimal coronary disease.  ?7. Bradycardia: [employer] PPM.  ?8. H/o traumatic [place] 2014.  ?9. CKD: Stage 3.  ?10. H/o CVA ?11. OSA: CPAP ?12. HTN ?13. Type II diabetes ?14. Pulmonary hypertension:  ?- PFTs 2017 with severe restriction.  ?- Echo (10/19): EF 60-65%, PASP 52 mmHg ?- RHC (4/19): Severe PAH with PVR 7.2 WU.  ?- RHC (10/19): mean RA 6, PA 56/17 mean 33, PCWP mean 12, CI 3.23, PVR 3.4 WU.  ?- RHC (3/22): mean 3, RV 30/1, PA 33/11, mean 20, PCWP mean 6, Oxygen saturations: ?PA 69% AO 100%, Cardiac Output (Fick) 6.18, Cardiac Index (Fick) 3.36, and PVR 2.3 WU ?- V/Q scan (10/19): No chronic PE.  ?- Anti-SCL 70 and ANA negative.  ACE normal.  RF slightly elevated at 15.  ?- Echo (3/21): EF 60-65%, normal RV, PASP 50 mmHg, moderate-severe MR. ?- TEE (1/23): EF 60-65%, mild RV dilation with normal RV systolic function, MR mild-moderate, moderate TR with vegetation on TV and pacemaker lead ?- Echo (1/23): EF 60-65%, normal RV, RVSP 51.5 mmHg, mild-moderate MR, moderate TR, ?FH: mother with CHF ?15. 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  ?16. Mitral regurgitation: moderate to severe on 3/22 echo.  ?17. Zio patch (3/22): 9.3% PACs, no AF, 2.9% PVCs ? ?Current Outpatient Medications  ?Medication Sig Dispense Refill  ? acetaminophen (TYLENOL) 325 MG tablet Take 325-650 mg by mouth every 6 (six) hours as needed for mild pain.    ? albuterol (ACCUNEB) 0.63 MG/3ML nebulizer solution Take 1 ampule by nebulization every 6 (six) hours as needed for wheezing.    ? amiodarone (PACERONE) 200 MG tablet Take 1 tablet (200 mg total) by mouth daily. 30 tablet 6  ? apixaban (ELIQUIS) 5 MG TABS tablet Take 5 mg by mouth 2 (two) times daily.    ? benzonatate (TESSALON) 200 MG capsule Take 1 capsule (200 mg total) by mouth 3 (three) times daily as needed for cough. 20 capsule 0  ? cefadroxil (DURICEF) 500 MG capsule Take 1 capsule  (500 mg total) by mouth 2 (two) times daily. 60 capsule 2  ? Difluprednate 0.05 % EMUL Place 1 drop into both eyes 2 (two) times daily.    ? DULoxetine (CYMBALTA) 60 MG capsule Take 60 mg by mouth daily.    ? fluticasone (FLONASE) 50 MCG/ACT nasal spray Place 2 sprays into both nostrils daily for 10 days. (Patient taking differently: Place 2 sprays into both nostrils daily. As needed)  2  ? gabapentin (NEURONTIN) 300 MG capsule Take 600 mg by mouth 3 (three) times daily.    ? macitentan (OPSUMIT) 10 MG tablet Take 1 tablet (10 mg total) by mouth daily. 90 tablet 3  ? meclizine (ANTIVERT) 25 MG tablet Take 1 tablet (25 mg total) by mouth 3 (three) times daily as needed for dizziness. 30 tablet 1  ? Menthol, Topical Analgesic, (BIOFREEZE EX) Apply 1 application topically 4 (four) times daily as needed (for pain).    ? midodrine (PROAMATINE) 10 MG tablet Take 1 tablet (10 mg total) by mouth  3 (three) times daily with meals. 90 tablet 2  ? rosuvastatin (CRESTOR) 10 MG tablet Take 1 tablet by mouth once daily 90 tablet 0  ? Selexipag (UPTRAVI) 1600 MCG TABS Take 1 tablet (1,600 mcg total) by mouth 2 (two) times daily. 60 tablet 11  ? sildenafil (REVATIO) 20 MG tablet Take 20 mg by mouth 3 (three) times daily.    ? spironolactone (ALDACTONE) 25 MG tablet Take 12.5 mg by mouth daily.    ? torsemide (DEMADEX) 20 MG tablet Take 1 tablet (20 mg total) by mouth daily. Dose reduced from 60 mg twice daily. 30 tablet 2  ? ?No current facility-administered medications for this encounter.  ? ? ?Allergies:   Aspirin and Other  ? ?Social History:  The patient  reports that she quit smoking about 31 years ago. Her smoking use included cigarettes. She has a 0.60 pack-year smoking history. She has never used smokeless tobacco. She reports that she does not drink alcohol and does not use drugs.  ? ?Family History:  The patient's family history includes Bone cancer in her sister; Breast cancer in her maternal aunt and sister; COPD in her  mother; Heart disease in her mother; Hypertension in her father, mother, sister, and sister; Thyroid disease in her mother, sister, and sister.  ? ?ROS:  Please see the history of present illness.   All ot

## 2021-09-04 ENCOUNTER — Inpatient Hospital Stay: Payer: Medicare HMO | Admitting: Internal Medicine

## 2021-09-05 ENCOUNTER — Telehealth: Payer: Self-pay | Admitting: Family Medicine

## 2021-09-05 NOTE — Telephone Encounter (Signed)
-----  Message from Deboraha Sprang, MD sent at 09/05/2021  1:17 PM EDT ----- ?Jadelynn Boylan   good morning, this lady's alk phos came back modestly elevated--would you be willing to pick up the ball and figure out if it is relevant and what needs to get done; certainly out of my [place].  Thanks ?

## 2021-09-05 NOTE — Telephone Encounter (Signed)
Please let the patient know that I received a message from Dr. Caryl Comes that her alkaline phosphatase was mildly elevated.  This could be coming from her bones, her liver, or her intestines.  Has she had any abdominal pain?  I would like to recheck this in the next couple of weeks.  Orders placed. ?

## 2021-09-06 ENCOUNTER — Ambulatory Visit (HOSPITAL_COMMUNITY)
Admission: RE | Admit: 2021-09-06 | Discharge: 2021-09-06 | Disposition: A | Discharge status: Home / Self Care | Payer: Medicare HMO | Source: Ambulatory Visit | Attending: Family Medicine | Admitting: Family Medicine

## 2021-09-06 ENCOUNTER — Inpatient Hospital Stay: Payer: Medicare HMO | Admitting: Internal Medicine

## 2021-09-06 ENCOUNTER — Telehealth (HOSPITAL_COMMUNITY): Payer: Self-pay

## 2021-09-06 ENCOUNTER — Other Ambulatory Visit: Payer: Self-pay

## 2021-09-06 ENCOUNTER — Telehealth: Payer: Self-pay | Admitting: Family Medicine

## 2021-09-06 ENCOUNTER — Ambulatory Visit (INDEPENDENT_AMBULATORY_CARE_PROVIDER_SITE_OTHER): Payer: Medicare HMO | Admitting: Internal Medicine

## 2021-09-06 ENCOUNTER — Encounter (HOSPITAL_COMMUNITY): Payer: Self-pay

## 2021-09-06 ENCOUNTER — Telehealth: Payer: Self-pay

## 2021-09-06 VITALS — Resp 16 | Ht 60.0 in | Wt 184.4 lb

## 2021-09-06 VITALS — BP 158/62 | HR 64 | Wt 184.8 lb

## 2021-09-06 DIAGNOSIS — I480 Paroxysmal atrial fibrillation: Secondary | ICD-10-CM

## 2021-09-06 DIAGNOSIS — J8410 Pulmonary fibrosis, unspecified: Secondary | ICD-10-CM | POA: Insufficient documentation

## 2021-09-06 DIAGNOSIS — N179 Acute kidney failure, unspecified: Secondary | ICD-10-CM | POA: Insufficient documentation

## 2021-09-06 DIAGNOSIS — I330 Acute and subacute infective endocarditis: Secondary | ICD-10-CM | POA: Diagnosis not present

## 2021-09-06 DIAGNOSIS — J449 Chronic obstructive pulmonary disease, unspecified: Secondary | ICD-10-CM | POA: Insufficient documentation

## 2021-09-06 DIAGNOSIS — J440 Chronic obstructive pulmonary disease with acute lower respiratory infection: Secondary | ICD-10-CM | POA: Insufficient documentation

## 2021-09-06 DIAGNOSIS — I340 Nonrheumatic mitral (valve) insufficiency: Secondary | ICD-10-CM | POA: Insufficient documentation

## 2021-09-06 DIAGNOSIS — I495 Sick sinus syndrome: Secondary | ICD-10-CM | POA: Diagnosis not present

## 2021-09-06 DIAGNOSIS — I2510 Atherosclerotic heart disease of native coronary artery without angina pectoris: Secondary | ICD-10-CM | POA: Diagnosis not present

## 2021-09-06 DIAGNOSIS — Z79899 Other long term (current) drug therapy: Secondary | ICD-10-CM | POA: Diagnosis not present

## 2021-09-06 DIAGNOSIS — Z9981 Dependence on supplemental oxygen: Secondary | ICD-10-CM | POA: Diagnosis not present

## 2021-09-06 DIAGNOSIS — R7881 Bacteremia: Secondary | ICD-10-CM | POA: Insufficient documentation

## 2021-09-06 DIAGNOSIS — Z8679 Personal history of other diseases of the circulatory system: Secondary | ICD-10-CM | POA: Insufficient documentation

## 2021-09-06 DIAGNOSIS — I959 Hypotension, unspecified: Secondary | ICD-10-CM | POA: Insufficient documentation

## 2021-09-06 DIAGNOSIS — I5032 Chronic diastolic (congestive) heart failure: Secondary | ICD-10-CM

## 2021-09-06 DIAGNOSIS — Z8673 Personal history of transient ischemic attack (TIA), and cerebral infarction without residual deficits: Secondary | ICD-10-CM | POA: Insufficient documentation

## 2021-09-06 DIAGNOSIS — Z7901 Long term (current) use of anticoagulants: Secondary | ICD-10-CM | POA: Diagnosis not present

## 2021-09-06 DIAGNOSIS — E1122 Type 2 diabetes mellitus with diabetic chronic kidney disease: Secondary | ICD-10-CM | POA: Insufficient documentation

## 2021-09-06 DIAGNOSIS — J9611 Chronic respiratory failure with hypoxia: Secondary | ICD-10-CM

## 2021-09-06 DIAGNOSIS — N183 Chronic kidney disease, stage 3 unspecified: Secondary | ICD-10-CM | POA: Insufficient documentation

## 2021-09-06 DIAGNOSIS — I2721 Secondary pulmonary arterial hypertension: Secondary | ICD-10-CM | POA: Insufficient documentation

## 2021-09-06 DIAGNOSIS — Z8701 Personal history of pneumonia (recurrent): Secondary | ICD-10-CM | POA: Diagnosis not present

## 2021-09-06 DIAGNOSIS — Z86718 Personal history of other venous thrombosis and embolism: Secondary | ICD-10-CM | POA: Diagnosis not present

## 2021-09-06 DIAGNOSIS — R5381 Other malaise: Secondary | ICD-10-CM

## 2021-09-06 DIAGNOSIS — E871 Hypo-osmolality and hyponatremia: Secondary | ICD-10-CM

## 2021-09-06 DIAGNOSIS — D649 Anemia, unspecified: Secondary | ICD-10-CM

## 2021-09-06 DIAGNOSIS — I130 Hypertensive heart and chronic kidney disease with heart failure and stage 1 through stage 4 chronic kidney disease, or unspecified chronic kidney disease: Secondary | ICD-10-CM | POA: Diagnosis not present

## 2021-09-06 DIAGNOSIS — I2690 Septic pulmonary embolism without acute cor pulmonale: Secondary | ICD-10-CM | POA: Insufficient documentation

## 2021-09-06 DIAGNOSIS — B954 Other streptococcus as the cause of diseases classified elsewhere: Secondary | ICD-10-CM | POA: Insufficient documentation

## 2021-09-06 DIAGNOSIS — Z452 Encounter for adjustment and management of vascular access device: Secondary | ICD-10-CM | POA: Insufficient documentation

## 2021-09-06 DIAGNOSIS — G4733 Obstructive sleep apnea (adult) (pediatric): Secondary | ICD-10-CM

## 2021-09-06 DIAGNOSIS — Z9989 Dependence on other enabling machines and devices: Secondary | ICD-10-CM | POA: Insufficient documentation

## 2021-09-06 DIAGNOSIS — I890 Lymphedema, not elsewhere classified: Secondary | ICD-10-CM

## 2021-09-06 DIAGNOSIS — I272 Pulmonary hypertension, unspecified: Secondary | ICD-10-CM | POA: Diagnosis not present

## 2021-09-06 DIAGNOSIS — Z8249 Family history of ischemic heart disease and other diseases of the circulatory system: Secondary | ICD-10-CM | POA: Insufficient documentation

## 2021-09-06 DIAGNOSIS — T827XXD Infection and inflammatory reaction due to other cardiac and vascular devices, implants and grafts, subsequent encounter: Secondary | ICD-10-CM

## 2021-09-06 DIAGNOSIS — R42 Dizziness and giddiness: Secondary | ICD-10-CM

## 2021-09-06 DIAGNOSIS — E785 Hyperlipidemia, unspecified: Secondary | ICD-10-CM | POA: Insufficient documentation

## 2021-09-06 DIAGNOSIS — Z950 Presence of cardiac pacemaker: Secondary | ICD-10-CM | POA: Insufficient documentation

## 2021-09-06 DIAGNOSIS — I10 Essential (primary) hypertension: Secondary | ICD-10-CM

## 2021-09-06 DIAGNOSIS — Z9181 History of falling: Secondary | ICD-10-CM | POA: Insufficient documentation

## 2021-09-06 MED ORDER — TORSEMIDE 20 MG PO TABS: 40.0000 mg | ORAL_TABLET | Freq: Every day | ORAL | 6 refills | Status: DC

## 2021-09-06 NOTE — Telephone Encounter (Signed)
-----   Message from Jabier Mutton, MD sent at 09/06/2021  4:50 PM EDT ----- ?Please call patient and arrange 3 month in person follow up ? ? ?She had some concern about the pacerpocket site... I asked her to send mychart picture or if not able to then see [place] next week to evaluate. Please follow up with her on this ? ?Thank you ?

## 2021-09-06 NOTE — Telephone Encounter (Signed)
Patient was called and I spoke with her daughter, the patient is scheduled to see the provider on 09/10/2021 and the provider agreed that she could do her labs at that visit and I informed the patient of that.  Ronella Plunk,cma  ?

## 2021-09-06 NOTE — Patient Instructions (Signed)
Increase Torsemide to 40 mg (2 tabs) Daily ? ?Your physician recommends that you return for lab work in: 10 days (09/16/21) at [facility], in the [place] ? ?Please wear your compression hose and abdominal binder daily, place them on as soon as you get up in the morning and remove before you go to bed at night. ? ?Your physician recommends that you schedule a follow-up appointment in: as scheduled with Dr [name] ? ?If you have any questions or concerns before your next appointment please send [place] a message through [place] or call our office at [phone].   ? ?TO LEAVE A MESSAGE FOR THE NURSE SELECT OPTION 2, PLEASE LEAVE A MESSAGE INCLUDING: ?YOUR NAME ?DATE OF BIRTH ?CALL BACK NUMBER ?REASON FOR CALL**this is important as we prioritize the call backs ? ?YOU WILL RECEIVE A CALL BACK THE SAME DAY AS LONG AS YOU CALL BEFORE 4:00 PM ? ?At the [facility], you and your health needs are our priority. As part of our continuing mission to provide you with exceptional heart care, we have created designated Provider Care Teams. These Care Teams include your primary Cardiologist (physician) and Advanced Practice Providers (APPs- Physician Assistants and Nurse Practitioners) who all work together to provide you with the care you need, when you need it.  ? ?You may see any of the following providers on your designated Care Team at your next follow up: ?Dr Glori Bickers ?Dr [name] ?Darrick Grinder, NP ?Lyda Jester, PA ?Jessica Milford,NP ?Marlyce Huge, PA ?Audry Riles, PharmD ? ? ?Please be sure to bring in all your medications bottles to every appointment.  ? ? ? ?

## 2021-09-06 NOTE — Telephone Encounter (Signed)
Can you contact the patient and confirm if she is taking gabapentin 600 mg three times daily? If so, I can then send the medication in for her.  ?

## 2021-09-06 NOTE — Progress Notes (Signed)
? ?Subjective:  ? ? Patient ID: Debra Saunders, female    DOB: Mar 02, 1946, 76 y.o.   MRN: [id number] ? ?Chief Complaint  ?Patient presents with  ? Telehealth Consent  ?  [facility] hosp follow up  ? ? ?HPI: ? ?Debra Saunders is a 76 y.o. 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 Initially started on ceftriaxone and passed Penicillin allergy test and was narrowed to Pencillin. She was not a surgical candidate to have the device removed or exchanged secondary to her multiple co-morbidities. Plan was for Penicillin for 6 weeks followed by oral suppression. Prior to discharge to skilled facility change of medication was requested and Cefazolin was started. End date of IV treatment is 08/08/21. Here today for hospitalization follow up. ? ?Debra Saunders has been receiving her Cefazolin as prescribed with no adverse side effects. Overall feeling well today and has been having fatigue. Has questions about how the infection is doing. PICC line is functioning appropriately. Working with physical and occupational therapy at her facility. No lab work is available for review.  ? ? ?----------------- ?09/06/2021 id clinic follow up ?She is having some loose stool only with eating, but feels well, and having good appetite ?She denies f/c/n/v/rash ?She is taking cefadroxil 500 mg bid ?She reports "my pacer moved" and described some "raised" skin above the pacer. No pain/discomfort ?I asked her to send me picture of that via mychart, and if not able to then she needs to come here physically next week ? ?Other ros negative ? ? ?Allergies  ?Allergen Reactions  ? Aspirin Anaphylaxis and Hives  ?  Pt states that she is unable to take because she had bleeding in her brain  ? Other Other (See Comments)  ?  Pt states that she is unable to take blood thinners  because she had bleeding in her brain; Blood Thinners-per doctor at [facility] ([place])  ? ? ? ? ?Outpatient Medications Prior to Visit  ?Medication Sig Dispense Refill  ? acetaminophen (TYLENOL) 325 MG tablet Take 325-650 mg by mouth every 6 (six) hours as needed for mild pain.    ? albuterol (ACCUNEB) 0.63 MG/3ML nebulizer solution Take 1 ampule by nebulization every 6 (six) hours as needed for wheezing.    ? amiodarone (PACERONE) 200 MG tablet Take 1 tablet (200 mg total) by mouth daily. 30 tablet 6  ? apixaban (ELIQUIS) 5 MG TABS tablet Take 5 mg by mouth 2 (two) times daily.    ? benzonatate (TESSALON) 200 MG capsule Take 1 capsule (200 mg total) by mouth 3 (three) times daily as needed for cough. 20 capsule 0  ? cefadroxil (DURICEF) 500 MG capsule Take 1 capsule (500 mg total) by mouth 2 (two) times daily. 60 capsule 2  ? Difluprednate 0.05 % EMUL Place 1 drop into both eyes 2 (two) times daily.    ? DULoxetine (CYMBALTA) 60 MG capsule Take 60 mg by mouth daily.    ? fluticasone (FLONASE) 50 MCG/ACT nasal spray Place 2 sprays into both nostrils daily for 10 days. (Patient taking differently: Place 2 sprays into both nostrils daily. As needed)  2  ? gabapentin (NEURONTIN) 300 MG capsule Take 600 mg by mouth 3 (three) times daily.    ? macitentan (OPSUMIT) 10 MG tablet Take 1 tablet (10 mg total) by mouth daily.  90 tablet 3  ? meclizine (ANTIVERT) 25 MG tablet Take 1 tablet (25 mg total) by mouth 3 (three) times daily as needed for dizziness. 30 tablet 1  ? Menthol, Topical Analgesic, (BIOFREEZE EX) Apply 1 application topically 4 (four) times daily as needed (for pain).    ? midodrine (PROAMATINE) 10 MG tablet Take 1 tablet (10 mg total) by mouth 3 (three) times daily with meals. 90 tablet 2  ? rosuvastatin (CRESTOR) 10 MG tablet Take 1 tablet by mouth once daily 90 tablet 0  ? Selexipag (UPTRAVI) 1600 MCG TABS Take 1 tablet (1,600 mcg total) by mouth 2 (two) times daily. 60 tablet 11  ? sildenafil (REVATIO)  20 MG tablet Take 20 mg by mouth 3 (three) times daily.    ? spironolactone (ALDACTONE) 25 MG tablet Take 12.5 mg by mouth daily.    ? torsemide (DEMADEX) 20 MG tablet Take 2 tablets (40 mg total) by mouth daily. 60 tablet 6  ? ?No facility-administered medications prior to visit.  ? ? ? ?Past Medical History:  ?Diagnosis Date  ? Arthritis   ? Atrial fibrillation, persistent ([place])   ? a. s/p TEE-guided DCCV on 08/20/2017  ? Cardiorenal syndrome   ? a. 09/2017 Creat rose to 2.7 w/ diuresis-->HD x 2.  ? Chest pain   ? a. 01/2012 Cath: nonobs dzs;  c. 04/2014 MV: No ischemia.  ? Chronic back pain   ? Chronic combined systolic (congestive) and diastolic (congestive) heart failure (HCC)   ? a. Dx 2005 @ [facility];  b. 02/2014 Echo: EF 55-60%; c. 07/2017: EF 30-35%, diffuse HK, trivial AI, severe MR, moderate TR; d. 09/2017 Echo: EF 40-45%; e. 02/2018 Echo: EF 55-60%, Gr1 DD; f. 08/2019 Echo: EF 60-65%, Gr2 DD, Nl RV fxn, RVSP 49.61mHg. Mod-Sev MR. Mild to mod TR. Mod PR. RA ~ 838mg.  ? Chronic respiratory failure (HCAvalon  ? a. on home O2  ? Gastritis   ? History of intracranial hemorrhage   ? a. 6/14 described by neurosurgery as "small frontal posttraumatic SAH " - pt was on xarelto @ the time.  ? Hyperlipidemia   ? Hypertension   ? Interstitial lung disease (HCAllenville  ? Lipoma of back   ? Lymphedema   ? a. Chronic LLE edema.  ? NICM (nonischemic cardiomyopathy) (HCMishicot  ? a. 09/2017 Echo: EF 40-45%; b. 09/2017 Cath: minor, insignificant CAD; c. 02/2018 Echo: EF 55-60%, Gr1 DD; d. 08/2019 Echo: EF 60-65%, Gr2 DD.  ? Obesity   ? PAH (pulmonary artery hypertension) (HCDighton  ? a. 09/2017 Echo: PASP 6534m; b. 09/2017 RHC: RA 23, RV 84/13, PA 84/29, PCWP 20, CO 3.89, CI 1.89. LVEDP 18.  ? Presence of permanent cardiac pacemaker 01/09/2014  ? Sepsis with metabolic encephalopathy (HCCDublin ? Sleep apnea   ? Streptococcal bacteremia   ? [facility]  ? Subarachnoid hemorrhage (HCCHamilton/30/2014  ? Symptomatic bradycardia   ? a. s/p BSX PPM.  ? Thyroid  nodule   ? Type II diabetes mellitus (HCCMillville ? Urinary incontinence   ? ? ? ?Past Surgical History:  ?Procedure Laterality Date  ? ABDOMINAL HYSTERECTOMY  1969  ? BREAST BIOPSY Right 10/28/2019  ? lymph node bx, [place], pending path   ? CARDIAC CATHETERIZATION  2013  ? [facility] obstructive [facility]: Only 20% ostial left circumflex.  ? CARDIOVERSION N/A 10/12/2017  ? Procedure: CARDIOVERSION;  Surgeon: GolMinna MerrittsD;  Location: [facility] ORS;  Service: Cardiovascular;  Laterality: N/A;  ? CARDIOVERSION N/A 07/04/2021  ? Procedure: CARDIOVERSION;  Surgeon: Larey Dresser, MD;  Location[facility] ENDOSCOPY;  Service: Cardiovascular;  Laterality: N/A;  ? CATARACT EXTRACTION W/PHACO Right 09/19/2020  ? Procedure: CATARACT EXTRACTION PHACO AND INTRAOCULAR LENS PLACEMENT (IOC) RIGHT DIABETIC 3.95 00:43.0 9.2%;  Surgeon: Leandrew Koyanagi, MD;  Location: [place];  Service: Ophthalmology;  Laterality: Right;  Pt requests to be last ?sleep apnea  ? CATARACT EXTRACTION W/PHACO Left 10/03/2020  ? Procedure: CATARACT EXTRACTION PHACO AND INTRAOCULAR LENS PLACEMENT ([place]) LEFT DIABETIC;  Surgeon: Leandrew Koyanagi, MD;  Location: [place];  Service: Ophthalmology;  Laterality: Left;  3.25 ?0:59.3 ?5.5%  ? CHOLECYSTECTOMY    ? COLONOSCOPY WITH PROPOFOL N/A 09/10/2018  ? Procedure: COLONOSCOPY WITH PROPOFOL;  Surgeon: Manya Silvas, MD;  Location[facility] ENDOSCOPY;  Service: Endoscopy;  Laterality: N/A;  ? CYST REMOVAL TRUNK  2002  ? BACK  ? DIALYSIS/PERMA CATHETER INSERTION N/A 10/06/2017  ? Procedure: DIALYSIS/PERMA CATHETER INSERTION;  Surgeon: Katha Cabal, MD;  Location: [place] CV LAB;  Service: Cardiovascular;  Laterality: N/A;  ? INSERT / REPLACE / REMOVE PACEMAKER    ? lipoma removal  2014  ? back  ? PACEMAKER INSERTION  2015  ? [place] dual chamber pacemaker implanted by Dr Caryl Comes for symptomatic bradycardia  ? PERMANENT PACEMAKER INSERTION N/A 01/09/2014  ? Procedure: PERMANENT  PACEMAKER INSERTION;  Surgeon: Deboraha Sprang, MD;  Location: [facility] CATH LAB;  Service: Cardiovascular;  Laterality: N/A;  ? RIGHT HEART CATH N/A 04/19/2018  ? Procedure: RIGHT HEART CATH;  Surgeon: [name], Keturah Barre

## 2021-09-06 NOTE — Progress Notes (Signed)
ReDS Vest / Clip - 09/06/21 1300   ? ?  ? ReDS Vest / Clip  ? Station Marker A   ? Ruler Value 28.5   ? ReDS Value Range High volume overload   ? ReDS Actual Value 41   ? ?  ?  ? ?  ? ? ?

## 2021-09-06 NOTE — Telephone Encounter (Signed)
Called and left patient a detailed message to confirm/remind patient of their appointment at the [facility] on 09/09/21.  ? ? ?

## 2021-09-06 NOTE — Telephone Encounter (Signed)
3 month follow up scheduled. Sent patient a message via my chart asking her to send a picture if unable to call our office to schedule an appointment to be evaluated.  ? ? ?[name] P [name], CMA ? ?

## 2021-09-06 NOTE — Telephone Encounter (Signed)
-----   Message from [name], [place] sent at 09/02/2021  4:32 PM EDT ----- ?She seen you on Friday.  Can you send new prescription in for her gabapentin to [facility] on [address].  She is taking 600 mg  3 times a day.   Her old prescription was for 300 mg 3 times a day.  This was changed in her hospital stay in January.    ? ?Thank you ? ?[name] Staley ?[place] EMT-Paramedic ?[phone] ? ?

## 2021-09-09 ENCOUNTER — Telehealth: Payer: Self-pay | Admitting: Family Medicine

## 2021-09-09 NOTE — Telephone Encounter (Signed)
Thu From [facility] called in to see if [facility] received a fax from there office on Pt. Thu requesting callback  ?

## 2021-09-09 NOTE — Progress Notes (Signed)
Remote pacemaker transmission.   

## 2021-09-10 ENCOUNTER — Ambulatory Visit: Payer: Medicare HMO | Admitting: Family Medicine

## 2021-09-10 MED ORDER — GABAPENTIN 300 MG PO CAPS: 600.0000 mg | ORAL_CAPSULE | Freq: Three times a day (TID) | ORAL | 1 refills | Status: DC

## 2021-09-10 NOTE — Addendum Note (Signed)
Addended by: [name] on: 09/10/2021 09:43 AM ? ? Modules accepted: Orders ? ?

## 2021-09-10 NOTE — Telephone Encounter (Signed)
I called the patient and she stated she takes 600 mg three times per day.  Debra Saunders,cma  ?

## 2021-09-10 NOTE — Telephone Encounter (Signed)
Refill send to pharmacy. She needs to monitor for drowsiness on that dose. If she feels drowsy with this she needs to let us know.  ?

## 2021-09-11 ENCOUNTER — Other Ambulatory Visit (HOSPITAL_COMMUNITY): Payer: Self-pay

## 2021-09-11 ENCOUNTER — Other Ambulatory Visit: Payer: Self-pay | Admitting: Family Medicine

## 2021-09-11 ENCOUNTER — Encounter (HOSPITAL_COMMUNITY): Payer: Self-pay

## 2021-09-11 DIAGNOSIS — M940 Chondrocostal junction syndrome [Tietze]: Secondary | ICD-10-CM

## 2021-09-11 NOTE — Telephone Encounter (Signed)
Spoke to representative and they are going to resend fax ?

## 2021-09-11 NOTE — Progress Notes (Signed)
Today had a home visit with Debra Saunders.  She states still feeling real bad.  She states her right ear unplugged and can hear out of it now.  She states her left ear is still pugged and can not hear with it.  She states still has headache above her eyes.  She states she thinks she still has sinus infection.  She has not made her ear, nose and throat appt yet. Advised she needs to.  Her appetite is good.  She has not weighed today, states she will start weighing tomorrow and keeping track of it.  She had PT at home yesterday, she states went ok.  She still has a lot of dizziness and feels like she is going to fall backwards.  She has been taking all her medications according to her boxes.  She has all her medications, called in refills for her antibiotic and duloxetine. She is aware of up coming appts and blood work next week.  She is wearing her ted hose with socks today.  She states has not heard from nursing they had ordered.  Talked to her about palliative Care and she is interested. Will consult with cardiology about getting her referred.   She has good family support at her home but transportation still an issue.  She denies any chest pain,  She states her pacemaker moved out of spot but she has now pushed it back.  Her rate is regular and appears to be working fine. She tries to watch her diet and fluids.  She is getting more active in her home.  She states she sleeps a lot and very tired all the time.  She has everything she needs for daily living.  Fixed her med boxes and will continue to visit for heart failure, diet and medication management.  ? ?Debra Saunders ?[place] EMT-Paramedic ?[phone] ?

## 2021-09-16 ENCOUNTER — Other Ambulatory Visit: Payer: Self-pay | Admitting: Internal Medicine

## 2021-09-18 ENCOUNTER — Other Ambulatory Visit (HOSPITAL_COMMUNITY): Payer: Self-pay

## 2021-09-18 ENCOUNTER — Encounter (HOSPITAL_COMMUNITY): Payer: Self-pay

## 2021-09-18 NOTE — Progress Notes (Signed)
Today had a home visit with [name].  She is still having a lot of dizziness.  She is having to go stay some with her daughter since her daughter that has been staying with her is going to [place] for awhile with her daughter to stay.  She can not dress herself without help yet.  She has PT, Nurse and OT coming in now.  She is aware of up coming appts.  She has all her medications.  She still has swelling in left leg and none in right.  She is elevating.  Weighing daily now, no weight gain.  Appetite is good.  Watching how much fluids she in drinking.  Sleeping good.  Denies any chest pain or increased in shortness of breath.  She has not made an ear, nose and throat appt yet.  Called in refills, she will get family to pick up.  She states still gets short of breath with activity.  She has been doing more around the home.  Will continue to visit for heart failure, diet and medication management.  ? ?Anvitha Hutmacher ?[place] EMT-Paramedic ?[phone] ?

## 2021-09-25 ENCOUNTER — Encounter: Payer: Self-pay | Admitting: Family Medicine

## 2021-09-25 ENCOUNTER — Ambulatory Visit (INDEPENDENT_AMBULATORY_CARE_PROVIDER_SITE_OTHER): Payer: Medicare HMO | Admitting: Family Medicine

## 2021-09-25 VITALS — BP 160/70 | HR 61 | Temp 98.9°F | Ht 60.0 in | Wt 187.0 lb

## 2021-09-25 DIAGNOSIS — I5032 Chronic diastolic (congestive) heart failure: Secondary | ICD-10-CM | POA: Diagnosis not present

## 2021-09-25 DIAGNOSIS — R42 Dizziness and giddiness: Secondary | ICD-10-CM | POA: Diagnosis not present

## 2021-09-25 DIAGNOSIS — S0990XD Unspecified injury of head, subsequent encounter: Secondary | ICD-10-CM

## 2021-09-25 DIAGNOSIS — J40 Bronchitis, not specified as acute or chronic: Secondary | ICD-10-CM | POA: Diagnosis not present

## 2021-09-25 DIAGNOSIS — J849 Interstitial pulmonary disease, unspecified: Secondary | ICD-10-CM | POA: Diagnosis not present

## 2021-09-25 MED ORDER — DOXYCYCLINE HYCLATE 100 MG PO TABS: 100.0000 mg | ORAL_TABLET | Freq: Two times a day (BID) | ORAL | 0 refills | Status: DC

## 2021-09-25 MED ORDER — PREDNISONE 20 MG PO TABS: 40.0000 mg | ORAL_TABLET | Freq: Every day | ORAL | 0 refills | Status: DC

## 2021-09-25 NOTE — Progress Notes (Signed)
?[name], MD ?Phone: [phone] ? ?Debra Saunders is a 76 y.o. female who presents today for follow-up. ? ?Dizziness: This continues to be an intermittent issue.  Feels like her head is moving if she sits down or stands up.  She had an appointment with ENT though she canceled it as she thought she was seeing them for her ears.  She notes her ears feel better. ? ?Head injury: She reports after leaving our office she thought about it some more and feels as though she did not hit her head.  She has had no headaches.  She did not have the CT completed. ? ?CHF: Patient notes her breathing is at her baseline.  She notes no orthopnea or PND.  She notes no change to chronic swelling. ? ?Cough: Patient notes coughing for the last couple of weeks.  Occurs most often at night and is productive of yellowish phlegm.  She notes no change to her chronic dyspnea.  She remains on oxygen 4 L via nasal cannula.  She does use her nebulizer treatments though has not been using them very frequently. ? ?Social History  ? ?Tobacco Use  ?Smoking Status Former  ? Packs/day: 0.30  ? Years: 2.00  ? Pack years: 0.60  ? Types: Cigarettes  ? Quit date: 06/23/1990  ? Years since quitting: 31.2  ?Smokeless Tobacco Never  ? ? ?Current Outpatient Medications on File Prior to Visit  ?Medication Sig Dispense Refill  ? acetaminophen (TYLENOL) 325 MG tablet Take 325-650 mg by mouth every 6 (six) hours as needed for mild pain.    ? albuterol (ACCUNEB) 0.63 MG/3ML nebulizer solution Take 1 ampule by nebulization every 6 (six) hours as needed for wheezing.    ? amiodarone (PACERONE) 200 MG tablet Take 1 tablet (200 mg total) by mouth daily. 30 tablet 6  ? apixaban (ELIQUIS) 5 MG TABS tablet Take 5 mg by mouth 2 (two) times daily.    ? benzonatate (TESSALON) 200 MG capsule Take 1 capsule (200 mg total) by mouth 3 (three) times daily as needed for cough. 20 capsule 0  ? cefadroxil (DURICEF) 500 MG capsule Take 1 capsule (500 mg total) by mouth 2  (two) times daily. 60 capsule 2  ? Difluprednate 0.05 % EMUL Place 1 drop into both eyes 2 (two) times daily.    ? DULoxetine (CYMBALTA) 30 MG capsule TAKE 1 CAPSULE BY MOUTH DAILY FOR 14 DAYS, THEN INCREASE TO 2 CAPSULES DAILY 60 capsule 0  ? DULoxetine (CYMBALTA) 60 MG capsule Take 60 mg by mouth daily.    ? gabapentin (NEURONTIN) 300 MG capsule Take 2 capsules (600 mg total) by mouth 3 (three) times daily. 540 capsule 1  ? macitentan (OPSUMIT) 10 MG tablet Take 1 tablet (10 mg total) by mouth daily. 90 tablet 3  ? meclizine (ANTIVERT) 25 MG tablet Take 1 tablet (25 mg total) by mouth 3 (three) times daily as needed for dizziness. 30 tablet 1  ? Menthol, Topical Analgesic, (BIOFREEZE EX) Apply 1 application topically 4 (four) times daily as needed (for pain).    ? midodrine (PROAMATINE) 10 MG tablet Take 1 tablet (10 mg total) by mouth 3 (three) times daily with meals. 90 tablet 2  ? rosuvastatin (CRESTOR) 10 MG tablet Take 1 tablet by mouth once daily 90 tablet 0  ? Selexipag (UPTRAVI) 1600 MCG TABS Take 1 tablet (1,600 mcg total) by mouth 2 (two) times daily. 60 tablet 11  ? sildenafil (REVATIO) 20 MG tablet Take  20 mg by mouth 3 (three) times daily.    ? spironolactone (ALDACTONE) 25 MG tablet Take 12.5 mg by mouth daily.    ? torsemide (DEMADEX) 20 MG tablet Take 2 tablets (40 mg total) by mouth daily. 60 tablet 6  ? fluticasone (FLONASE) 50 MCG/ACT nasal spray Place 2 sprays into both nostrils daily for 10 days. (Patient taking differently: Place 2 sprays into both nostrils daily. As needed)  2  ? ?No current facility-administered medications on file prior to visit.  ? ? ? ?ROS see history of present illness ? ?Objective ? ?Physical Exam ?Vitals:  ? 09/25/21 1209  ?BP: (!) 160/70  ?Pulse: 61  ?Temp: 98.9 ?F (37.2 ?C)  ?SpO2: 94%  ? ? ?BP Readings from Last 3 Encounters:  ?09/25/21 (!) 160/70  ?09/18/21 137/75  ?09/11/21 109/68  ? ?Wt Readings from Last 3 Encounters:  ?09/25/21 187 lb (84.8 kg)  ?09/18/21 181  lb 3.2 oz (82.2 kg)  ?09/06/21 184 lb 12.8 oz (83.8 kg)  ? ? ?Physical Exam ?Constitutional:   ?   General: She is not in acute distress. ?   Appearance: She is not diaphoretic.  ?Cardiovascular:  ?   Rate and Rhythm: Normal rate and regular rhythm.  ?   Heart sounds: Normal heart sounds.  ?Pulmonary:  ?   Effort: Pulmonary effort is normal. No respiratory distress.  ?   Breath sounds: Wheezing (Faint expiratory wheezes throughout) present. No rhonchi or rales.  ?Skin: ?   General: Skin is warm and dry.  ?Neurological:  ?   Mental Status: She is alert.  ? ? ? ?Assessment/Plan: Please see individual problem list. ? ?Problem List Items Addressed This Visit   ? ? Chronic diastolic CHF (congestive heart failure) ([place])  ?  Patient reports she has been taking torsemide 40 mg daily as well as her spironolactone.  She will continue those things.  She appears to be relatively euvolemic today. ?  ?  ? Head injury  ?  Reports that she actually did not hit her head when she fell previously.  She notes no headaches.  No need for further work-up. ?  ?  ? Interstitial lung disease ([place]) - Primary  ?  Likely with an exacerbation or bronchitis at this time.  We will treat with prednisone 40 mg once daily for 5 days and doxycycline 100 mg twice daily for 7 days.  She will use her nebulizer every 6 hours for the next 2 days and then go back to using it as needed.  If she is not improving she will let us know. ?  ?  ? Severe dizziness  ?  Ongoing issue.  Discussed that she does need to see ENT for this.  They were provided with the phone number to call to get this rescheduled. ?  ?  ? ?Other Visit Diagnoses   ? ? Bronchitis      ? Relevant Medications  ? predniSONE (DELTASONE) 20 MG tablet  ? doxycycline (VIBRA-TABS) 100 MG tablet  ? ?  ? ? ? ?Return in about 3 months (around 12/25/2021). ? ?This visit occurred during the SARS-CoV-2 public health emergency.  Safety protocols were in place, including screening questions prior to the  visit, additional usage of staff PPE, and extensive cleaning of exam room while observing appropriate contact time as indicated for disinfecting solutions.  ? ? ?[name], MD ?[place] ? ?

## 2021-09-25 NOTE — Assessment & Plan Note (Signed)
Reports that she actually did not hit her head when she fell previously.  She notes no headaches.  No need for further work-up. ?

## 2021-09-25 NOTE — Assessment & Plan Note (Signed)
Ongoing issue.  Discussed that she does need to see ENT for this.  They were provided with the phone number to call to get this rescheduled. ?

## 2021-09-25 NOTE — Assessment & Plan Note (Addendum)
Likely with an exacerbation or bronchitis at this time.  We will treat with prednisone 40 mg once daily for 5 days and doxycycline 100 mg twice daily for 7 days.  She will use her nebulizer every 6 hours for the next 2 days and then go back to using it as needed.  If she is not improving she will let us know. ?

## 2021-09-25 NOTE — Patient Instructions (Addendum)
Nice to see you. ?Please call ENT at [phone] to reschedule your appointment. ?We will treat you for bronchitis with prednisone and doxycycline.  If you develop any diarrhea while taking the antibiotic please let us know. ?If your breathing worsens please seek medical attention.  If you are not improving with your cough please let us know. ?

## 2021-09-25 NOTE — Assessment & Plan Note (Signed)
Patient reports she has been taking torsemide 40 mg daily as well as her spironolactone.  She will continue those things.  She appears to be relatively euvolemic today. ?

## 2021-09-26 ENCOUNTER — Encounter (HOSPITAL_COMMUNITY): Payer: Self-pay | Admitting: Cardiology

## 2021-09-26 ENCOUNTER — Telehealth (HOSPITAL_COMMUNITY): Payer: Self-pay

## 2021-09-26 ENCOUNTER — Ambulatory Visit (HOSPITAL_COMMUNITY)
Admission: RE | Admit: 2021-09-26 | Discharge: 2021-09-26 | Disposition: A | Discharge status: Home / Self Care | Payer: Medicare HMO | Source: Ambulatory Visit | Attending: Cardiology | Admitting: Cardiology

## 2021-09-26 VITALS — Wt 186.8 lb

## 2021-09-26 DIAGNOSIS — Z950 Presence of cardiac pacemaker: Secondary | ICD-10-CM | POA: Diagnosis not present

## 2021-09-26 DIAGNOSIS — I5032 Chronic diastolic (congestive) heart failure: Secondary | ICD-10-CM

## 2021-09-26 DIAGNOSIS — I480 Paroxysmal atrial fibrillation: Secondary | ICD-10-CM | POA: Diagnosis not present

## 2021-09-26 DIAGNOSIS — G4733 Obstructive sleep apnea (adult) (pediatric): Secondary | ICD-10-CM | POA: Diagnosis not present

## 2021-09-26 DIAGNOSIS — I330 Acute and subacute infective endocarditis: Secondary | ICD-10-CM

## 2021-09-26 DIAGNOSIS — R7881 Bacteremia: Secondary | ICD-10-CM | POA: Insufficient documentation

## 2021-09-26 DIAGNOSIS — Z79899 Other long term (current) drug therapy: Secondary | ICD-10-CM | POA: Insufficient documentation

## 2021-09-26 DIAGNOSIS — I130 Hypertensive heart and chronic kidney disease with heart failure and stage 1 through stage 4 chronic kidney disease, or unspecified chronic kidney disease: Secondary | ICD-10-CM | POA: Diagnosis not present

## 2021-09-26 DIAGNOSIS — Z86718 Personal history of other venous thrombosis and embolism: Secondary | ICD-10-CM | POA: Insufficient documentation

## 2021-09-26 DIAGNOSIS — I272 Pulmonary hypertension, unspecified: Secondary | ICD-10-CM | POA: Diagnosis not present

## 2021-09-26 DIAGNOSIS — I951 Orthostatic hypotension: Secondary | ICD-10-CM | POA: Insufficient documentation

## 2021-09-26 DIAGNOSIS — Z9981 Dependence on supplemental oxygen: Secondary | ICD-10-CM | POA: Insufficient documentation

## 2021-09-26 DIAGNOSIS — I5033 Acute on chronic diastolic (congestive) heart failure: Secondary | ICD-10-CM | POA: Diagnosis not present

## 2021-09-26 DIAGNOSIS — Z7901 Long term (current) use of anticoagulants: Secondary | ICD-10-CM | POA: Diagnosis not present

## 2021-09-26 DIAGNOSIS — I890 Lymphedema, not elsewhere classified: Secondary | ICD-10-CM | POA: Insufficient documentation

## 2021-09-26 DIAGNOSIS — J9611 Chronic respiratory failure with hypoxia: Secondary | ICD-10-CM | POA: Diagnosis not present

## 2021-09-26 DIAGNOSIS — J8410 Pulmonary fibrosis, unspecified: Secondary | ICD-10-CM | POA: Insufficient documentation

## 2021-09-26 LAB — COMPREHENSIVE METABOLIC PANEL
ALT: 9 U/L (ref 0–44)
AST: 13 U/L — ABNORMAL LOW (ref 15–41)
Albumin: 3.3 g/dL — ABNORMAL LOW (ref 3.5–5.0)
Alkaline Phosphatase: 93 U/L (ref 38–126)
Anion gap: 8 (ref 5–15)
BUN: 14 mg/dL (ref 8–23)
CO2: 31 mmol/L (ref 22–32)
Calcium: 8.9 mg/dL (ref 8.9–10.3)
Chloride: 101 mmol/L (ref 98–111)
Creatinine, Ser: 1.19 mg/dL — ABNORMAL HIGH (ref 0.44–1.00)
GFR, Estimated: 47 mL/min — ABNORMAL LOW (ref >60)
Glucose, Bld: 110 mg/dL — ABNORMAL HIGH (ref 70–99)
Potassium: 2.9 mmol/L — ABNORMAL LOW (ref 3.5–5.1)
Sodium: 140 mmol/L (ref 135–145)
Total Bilirubin: 0.6 mg/dL (ref 0.3–1.2)
Total Protein: 6.9 g/dL (ref 6.5–8.1)

## 2021-09-26 LAB — TSH: TSH: 0.463 u[IU]/mL (ref 0.350–4.500)

## 2021-09-26 LAB — BRAIN NATRIURETIC PEPTIDE: B Natriuretic Peptide: 145.2 pg/mL — ABNORMAL HIGH (ref 0.0–100.0)

## 2021-09-26 MED ORDER — POTASSIUM CHLORIDE CRYS ER 20 MEQ PO TBCR: 40.0000 meq | EXTENDED_RELEASE_TABLET | Freq: Two times a day (BID) | ORAL | 3 refills | Status: DC

## 2021-09-26 MED ORDER — TORSEMIDE 20 MG PO TABS: 40.0000 mg | ORAL_TABLET | Freq: Two times a day (BID) | ORAL | 6 refills | Status: DC

## 2021-09-26 MED ORDER — MIDODRINE HCL 10 MG PO TABS: 15.0000 mg | ORAL_TABLET | Freq: Three times a day (TID) | ORAL | 3 refills | Status: DC

## 2021-09-26 NOTE — Telephone Encounter (Addendum)
Pt aware, agreeable, and verbalized understanding ? ?Labs ordered and med list updated  ? ?----- Message from Larey Dresser, MD sent at 09/26/2021  3:59 PM EDT ----- ?Start KCl 40 mEq bid.  BMET next week.  ?

## 2021-09-26 NOTE — Patient Instructions (Signed)
Medication Changes: ? ?Increase Midodrine to '15mg'$   Three times a day ? ?Increase Torsemide to 40 mg Twice daily ? ? ?Lab Work: ? ?Labs done today, your results will be available in MyChart, we will contact you for abnormal readings. ? ? ?Testing/Procedures: ? ?Repeat blood work in 10-14 days at [facility]  ? ?Referrals: ? ?none ? ?Special Instructions // Education: ? ?none ? ?Follow-Up in: 3 weeks  ? ?At the [facility], you and your health needs are our priority. We have a designated team specialized in the treatment of Heart Failure. This Care Team includes your primary Heart Failure Specialized Cardiologist (physician), Advanced Practice Providers (APPs- Physician Assistants and Nurse Practitioners), and Pharmacist who all work together to provide you with the care you need, when you need it.  ? ?You may see any of the following providers on your designated Care Team at your next follow up: ? ?Dr Glori Bickers ?Dr [name] ?Darrick Grinder, NP ?Lyda Jester, PA ?Jessica Milford,NP ?Marlyce Huge, PA ?Audry Riles, PharmD ? ? ?Please be sure to bring in all your medications bottles to every appointment.  ? ?Need to Contact us: ? ?If you have any questions or concerns before your next appointment please send [place] a message through [place] or call our office at [phone].   ? ?TO LEAVE A MESSAGE FOR THE NURSE SELECT OPTION 2, PLEASE LEAVE A MESSAGE INCLUDING: ?YOUR NAME ?DATE OF BIRTH ?CALL BACK NUMBER ?REASON FOR CALL**this is important as we prioritize the call backs ? ?YOU WILL RECEIVE A CALL BACK THE SAME DAY AS LONG AS YOU CALL BEFORE 4:00 PM ? ? ?

## 2021-09-26 NOTE — Progress Notes (Signed)
?  ? ?Date:  09/26/2021  ? ?ID:  Debra Saunders, DOB 01/06/1946, MRN [id number]   ?Provider location: [place] [facility] ?Type of Visit: Established patient  ? ?PCP:  [name], MD  ?Cardiologist:  [name], MD ?Primary HF: Dr. [name] ? ?Chief Complaint: post-hospital HF follow up ?  ?History of Present Illness: ?Debra Saunders is a 76 y.o. 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 ?  ?Admitted 10/24-10/29/19 with acute on chronic diastolic HF. HF team consulted with concerns for low output. PICC was placed and milrinone to support RV function was started along with IV Lasix to facilitate diuresis. RHC done to evaluate PAH (see below). V/Q scan negative. High res CT showed pulmonary fibrosis. Pulmonary consult and recommended stopping amiodarone. She was transitioned to torsemide 60 mg BID. DC weight: 226 lbs.  ?  ?She has started on selexipag for pulmonary hypertension in addition to Opsumit and Revatio.     ? ?Echo in 3/21 showed EF 60-65%, normal RV, PASP 50 mmHg, moderate-severe MR.  ? ?Had [place] 08/30/2020- low/normal filling pressures, mild pumonary htn, and preserved cardiac. output.  ? ?Admitted to [facility] 1/23 with endocarditis and PNA, requiring pressors. BCx positive for Streptococcus group G.  All her PAH meds were held. TEE showed a large vegetation on the pacer wire in the right atrium above the tricuspid valve (1 x 1.5 cm).  No vegetations noted on the valves.  EF was 55% and there was note of mild to moderate mitral regurgitation. RV ok. She was transferred to [facility] for pacemaker extraction. EP felt she was not a candidate due to multiple comorbidities. ID  consulted and planned for 6 weeks of IV abx. She developed Afib with RVR and started on amio. She underwent successful TEE-guided DCCV 07/14/21. Hospitalization complicated by anemia, hypotension, and AKI.  Midodrine started during admission. She was discharged to SNF, weight 198 lbs. ? ?Admitted 3/23 with lightheadedness. Treated for sinus infection, HF meds held due to othostasis. Midodrine increased to 10 mg tid. HH PT arranged.  Repeat echo in 3/23 showed EF 60-65%, mild LVH, normal RV, IVC normal, no definite vegetation noted.  ? ?She returns for followup of CHF.  She is home-bound and remains very weak.  Uses wheelchair when she leaves the house.  She is short of breath walking across her house, short of breath bathing. Using 4L home oxygen.  Weight up 2 lbs.  Wearing abdominal binder, sometimes compression stockings but hard to get them on. No chest pain.  She still gets lightheaded with standing and has fallen once since last appt.  ? ?ECG (personally reviewed): A-V sequential pacing.  ? ?6 minute walk (1/20): 98 m ?6 minute walk (7/20): 213 m ?6 minute walk (3/22): 198 m ?6 minute walk (10/22): 229 m ?  ?Labs (11/19): digoxin 0.8 ?Labs (1/20): K 4.2, creatinine 1.36 => 1.33, digoxin 0.8 ?Labs (2/20): digoxin 0.2, K 4.3, creatinine 1.32 ?Labs (5/20): K 4.5, creatinine 1.4, digoxin 0.8 ?Labs (1/22): K 4, creatinine 1.07 ?Labs (8/22): K 4.1, creatinine 1.03 ?Labs (1/23): K 3.7, creatinine 1.29 ?Labs (1/23): K 4.7, creatinine 1.07 ?Labs (3/23): K 4.3, creatinine 1.24 => 1.07, hgb 9.4 ?  ?PMH: ?  1. Lymphedema: Left lower leg (chronic).  ?2. COPD ?3. 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  ?- PFTs (2017) with FEV1 64%, FEV1/FVC ratio 106%, TLC 49%, DLCO 39% (restrictive).  ?- CT chest high resolution (10/19): Pulmonary parenchymal pattern of fibrosis.  ?- CT chest (4/22): Chronic ILD ?4. Atrial fibrillation: Paroxysmal.  She was previously off amiodarone due to lung disease.  ?- 2/19 DCCV.   ?- 1/23 DCCV ?5. H/o DVT s/p IVC filter in 2004.  ?6. CAD: 4/19 coronary angiography with minimal coronary disease.  ?7. Bradycardia: [employer] PPM.  ?8. H/o traumatic [place] 2014.  ?9. CKD: Stage 3.  ?10. H/o CVA ?11. OSA: CPAP ?12. HTN ?13. Type II diabetes ?14. Pulmonary hypertension:  ?- PFTs 2017 with severe restriction.  ?- Echo (10/19): EF 60-65%, PASP 52 mmHg ?- RHC (4/19): Severe PAH with PVR 7.2 WU.  ?- RHC (10/19): mean RA 6, PA 56/17 mean 33, PCWP mean 12, CI 3.23, PVR 3.4 WU.  ?- RHC (3/22): mean 3, RV 30/1, PA 33/11, mean 20, PCWP mean 6, Oxygen saturations: ?PA 69% AO 100%, Cardiac Output (Fick) 6.18, Cardiac Index (Fick) 3.36, and PVR 2.3 WU ?- V/Q scan (10/19): No chronic PE.  ?- Anti-SCL 70 and ANA negative.  ACE normal.  RF slightly elevated at 15.  ?- Echo (3/21): EF 60-65%, normal RV, PASP 50 mmHg, moderate-severe MR. ?- TEE (1/23): EF 60-65%, mild RV dilation with normal RV systolic function, MR mild-moderate, moderate TR with vegetation on TV and pacemaker lead ?- Echo (1/23): EF 60-65%, normal RV, RVSP 51.5 mmHg, mild-moderate MR, moderate TR ?- Echo (3/23): EF 60-65%, mild LVH, normal RV, IVC normal, no definite vegetation noted.  ?FH: mother with CHF ?15. 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  ?16. Mitral regurgitation: moderate to severe on 3/22 echo.  ?17. Zio patch (3/22): 9.3% PACs, no AF, 2.9% PVCs ?18. 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  Deemed too high risk to remove device so have tried to treat through.  ? ?Current Outpatient Medications  ?Medication Sig Dispense Refill  ? acetaminophen (TYLENOL) 325 MG tablet Take 325-650 mg by mouth every 6 (six) hours as needed for mild pain.    ? albuterol (ACCUNEB) 0.63 MG/3ML nebulizer solution Take 1 ampule by nebulization every 6 (six) hours as needed for wheezing.    ? amiodarone (PACERONE) 200 MG tablet Take 1 tablet (200 mg total) by mouth daily. 30 tablet 6  ? apixaban (ELIQUIS) 5  MG TABS tablet Take 5 mg by mouth 2 (two) times daily.    ? benzonatate (TESSALON) 200 MG capsule Take 1 capsule (200 mg total) by mouth 3 (three) times daily as needed for cough. 20 capsule 0  ? cefadroxil (DURICEF) 500 MG capsule Take 1 capsule (500 mg total) by mouth 2 (two) times daily. 60 capsule 2  ? Difluprednate 0.05 % EMUL Place 1 drop into both eyes 2 (two) times daily.    ? doxycycline (VIBRA-TABS) 100 MG tablet Take 1 tablet (100 mg total) by mouth 2 (two) times daily. 14 tablet 0  ? DULoxetine (CYMBALTA) 30 MG capsule Take 60 mg by mouth daily.    ? gabapentin (NEURONTIN) 300 MG capsule Take 2 capsules (600 mg total) by mouth 3 (three) times daily. 540 capsule 1  ? macitentan (OPSUMIT) 10 MG tablet Take 1 tablet (10 mg total) by mouth daily. 90 tablet 3  ? meclizine (ANTIVERT) 25 MG tablet Take 1  tablet (25 mg total) by mouth 3 (three) times daily as needed for dizziness. 30 tablet 1  ? Menthol, Topical Analgesic, (BIOFREEZE EX) Apply 1 application topically 4 (four) times daily as needed (for pain).    ? predniSONE (DELTASONE) 20 MG tablet Take 2 tablets (40 mg total) by mouth daily with breakfast. 10 tablet 0  ? rosuvastatin (CRESTOR) 10 MG tablet Take 1 tablet by mouth once daily 90 tablet 0  ? Selexipag (UPTRAVI) 1600 MCG TABS Take 1 tablet (1,600 mcg total) by mouth 2 (two) times daily. 60 tablet 11  ? sildenafil (REVATIO) 20 MG tablet Take 20 mg by mouth 3 (three) times daily.    ? spironolactone (ALDACTONE) 25 MG tablet Take 25 mg by mouth daily.    ? midodrine (PROAMATINE) 10 MG tablet Take 1.5 tablets (15 mg total) by mouth 3 (three) times daily with meals. 135 tablet 3  ? potassium chloride SA (KLOR-CON M) 20 MEQ tablet Take 2 tablets (40 mEq total) by mouth 2 (two) times daily. 90 tablet 3  ? torsemide (DEMADEX) 20 MG tablet Take 2 tablets (40 mg total) by mouth 2 (two) times daily. 180 tablet 6  ? ?No current facility-administered medications for this encounter.  ? ? ?Allergies:   Aspirin  and Other  ? ?Social History:  The patient  reports that she quit smoking about 31 years ago. Her smoking use included cigarettes. She has a 0.60 pack-year smoking history. She has never used smokeless tobacc

## 2021-10-01 DIAGNOSIS — H6122 Impacted cerumen, left ear: Secondary | ICD-10-CM | POA: Diagnosis not present

## 2021-10-01 DIAGNOSIS — R42 Dizziness and giddiness: Secondary | ICD-10-CM | POA: Diagnosis not present

## 2021-10-01 DIAGNOSIS — H9042 Sensorineural hearing loss, unilateral, left ear, with unrestricted hearing on the contralateral side: Secondary | ICD-10-CM | POA: Diagnosis not present

## 2021-10-02 ENCOUNTER — Other Ambulatory Visit (HOSPITAL_COMMUNITY): Payer: Self-pay

## 2021-10-02 ENCOUNTER — Encounter (HOSPITAL_COMMUNITY): Payer: Self-pay

## 2021-10-02 MED ORDER — TORSEMIDE 20 MG PO TABS: 40.0000 mg | ORAL_TABLET | Freq: Two times a day (BID) | ORAL | 5 refills | Status: DC

## 2021-10-02 NOTE — Progress Notes (Signed)
Today had a home visit with Debra Saunders.  She states doing good with PT, she is getting around better.  Discussed her appts last week.  She is finishing up her antibiotics and prednisone.  She states her potassium was low and they have increased it now.  She has all her medications.  Filled her boxes according to Epic and doctors notes.  Called in refills for torsemide and spironolactone.  She states dizziness is still bad when up and about, she went to her ear, nose and throat appt and they said everything looked good.  None of her doctors are sure why she is so dizzy.  Her PT showed up  during our visit.  Will continue to visit for heart failure, diet and medication compliance.  She appears in a good mood today and appears she is getting to feeling better.  ? ?[name] ?[place] EMT-Paramedic ?[phone] ?

## 2021-10-07 ENCOUNTER — Other Ambulatory Visit
Admission: RE | Admit: 2021-10-07 | Discharge: 2021-10-07 | Disposition: A | Discharge status: Home / Self Care | Payer: Medicare HMO | Attending: Cardiology | Admitting: Cardiology

## 2021-10-07 DIAGNOSIS — I5032 Chronic diastolic (congestive) heart failure: Secondary | ICD-10-CM | POA: Insufficient documentation

## 2021-10-07 LAB — BASIC METABOLIC PANEL
Anion gap: 8 (ref 5–15)
BUN: 36 mg/dL — ABNORMAL HIGH (ref 8–23)
CO2: 28 mmol/L (ref 22–32)
Calcium: 9 mg/dL (ref 8.9–10.3)
Chloride: 99 mmol/L (ref 98–111)
Creatinine, Ser: 1.58 mg/dL — ABNORMAL HIGH (ref 0.44–1.00)
GFR, Estimated: 33 mL/min — ABNORMAL LOW (ref >60)
Glucose, Bld: 119 mg/dL — ABNORMAL HIGH (ref 70–99)
Potassium: 5 mmol/L (ref 3.5–5.1)
Sodium: 135 mmol/L (ref 135–145)

## 2021-10-08 ENCOUNTER — Other Ambulatory Visit (HOSPITAL_COMMUNITY): Payer: Medicare HMO

## 2021-10-12 ENCOUNTER — Other Ambulatory Visit (HOSPITAL_COMMUNITY): Payer: Self-pay | Admitting: Cardiology

## 2021-10-14 DIAGNOSIS — H209 Unspecified iridocyclitis: Secondary | ICD-10-CM | POA: Diagnosis not present

## 2021-10-21 ENCOUNTER — Other Ambulatory Visit: Payer: Self-pay | Admitting: Family

## 2021-10-21 NOTE — Progress Notes (Addendum)
?  ? ?Date:  10/23/2021  ? ?ID:  Debra Saunders, DOB [birth year], MRN [id number]   ?Provider location: [facility] ?Type of Visit: Established patient  ? ?PCP:  [name], MD  ?Cardiologist:  [name], MD ?HF cardiologist: Dr. [name] ? ?Chief Complaint: HF follow up ?  ?History of Present Illness: ?Debra Saunders is a 76 y.o. 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 ?  ?Admitted 10/24-10/29/19 with acute on chronic diastolic HF. HF team consulted with concerns for low output. PICC was placed and milrinone to support RV function was started along with IV Lasix to facilitate diuresis. RHC done to evaluate PAH (see below). V/Q scan negative. High res CT showed pulmonary fibrosis. Pulmonary consult and recommended stopping amiodarone. She was transitioned to torsemide 60 mg BID. DC weight: 226 lbs.  ?  ?She has started on selexipag for pulmonary hypertension in addition to Opsumit and Revatio.     ? ?Echo in 3/21 showed EF 60-65%, normal RV, PASP 50 mmHg, moderate-severe MR.  ? ?Had [place] 08/30/2020- low/normal filling pressures, mild pumonary htn, and preserved cardiac. output.  ? ?Admitted to [facility] 1/23 with endocarditis and PNA, requiring pressors. BCx positive for Streptococcus group G.  All her PAH meds were held. TEE showed a large vegetation on the pacer wire in the right atrium above the tricuspid valve (1 x 1.5 cm).  No vegetations noted on the valves.  EF was 55% and there was note of mild to moderate mitral regurgitation. RV ok. She was transferred to [facility] for pacemaker extraction. EP felt she was not a candidate due to multiple comorbidities. ID consulted  and planned for 6 weeks of IV abx. She developed Afib with RVR and started on amio. She underwent successful TEE-guided DCCV 07/14/21. Hospitalization complicated by anemia, hypotension, and AKI.  Midodrine started during admission. She was discharged to SNF, weight 198 lbs. ? ?Admitted 3/23 with lightheadedness. Treated for sinus infection, HF meds held due to othostasis. Midodrine increased to 10 mg tid. HH PT arranged.  Repeat echo in 3/23 showed EF 60-65%, mild LVH, normal RV, IVC normal, no definite vegetation noted.  ? ?Follow up 4/23, she was volume overloaded and torsemide increased to 40 bid, midodrine increased to 15 mg tid.  ? ?Today she returns for HF follow up with her daughter. Overall feeling fair, breathing is somewhat better on higher dose of torsemide. Dizziness remains biggest complaint. She has dyspnea with washing dishes. Legs swell more in the evening but improve in the morning. Denies palpitations or CP. Chronically sleeps reclined in hospital bed. Appetite ok. No fever or chills. Weight at home 179-180 pounds. Taking all medications. Complaint with abd binder and compression hose. Asking about home use of PureWick. Drinking well over 2 L/fluids a day. ? ?ReDs: 45% ? ?6 minute walk (1/20): 98 m ?6 minute walk (7/20): 213 m ?6 minute walk (3/22): 198 m ?6 minute walk (10/22): 229 m ?  ?Labs (11/19): digoxin 0.8 ?Labs (1/20): K 4.2, creatinine 1.36 => 1.33, digoxin 0.8 ?Labs (2/20): digoxin 0.2, K 4.3, creatinine 1.32 ?Labs (5/20): K 4.5, creatinine 1.4, digoxin 0.8 ?Labs (1/22): K 4, creatinine 1.07 ?Labs (8/22): K 4.1, creatinine  1.03 ?Labs (1/23): K 3.7, creatinine 1.29 ?Labs (1/23): K 4.7, creatinine 1.07 ?Labs (3/23): K 4.3, creatinine 1.24 => 1.07, hgb 9.4 ?Labs (4/23): K 5.0, creatinine 1.58, TSH normal, LFTs normal ?  ?PMH: ?1. Lymphedema: Left lower leg (chronic).  ?2. COPD ?3. 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  ?- PFTs (2017) with FEV1 64%, FEV1/FVC ratio  106%, TLC 49%, DLCO 39% (restrictive).  ?- CT chest high resolution (10/19): Pulmonary parenchymal pattern of fibrosis.  ?- CT chest (4/22): Chronic ILD ?4. Atrial fibrillation: Paroxysmal.  She was previously off amiodarone due to lung disease.  ?- 2/19 DCCV.  ?- 1/23 DCCV ?5. H/o DVT s/p IVC filter in 2004.  ?6. CAD: 4/19 coronary angiography with minimal coronary disease.  ?7. Bradycardia: [employer] PPM.  ?8. H/o traumatic [place] 2014.  ?9. CKD: Stage 3.  ?10. H/o CVA ?11. OSA: CPAP ?12. HTN ?13. Type II diabetes ?14. Pulmonary hypertension:  ?- PFTs 2017 with severe restriction.  ?- Echo (10/19): EF 60-65%, PASP 52 mmHg ?- RHC (4/19): Severe PAH with PVR 7.2 WU.  ?- RHC (10/19): mean RA 6, PA 56/17 mean 33, PCWP mean 12, CI 3.23, PVR 3.4 WU.  ?- RHC (3/22): mean 3, RV 30/1, PA 33/11, mean 20, PCWP mean 6, Oxygen saturations: ?PA 69% AO 100%, Cardiac Output (Fick) 6.18, Cardiac Index (Fick) 3.36, and PVR 2.3 WU ?- V/Q scan (10/19): No chronic PE.  ?- Anti-SCL 70 and ANA negative.  ACE normal.  RF slightly elevated at 15.  ?- Echo (3/21): EF 60-65%, normal RV, PASP 50 mmHg, moderate-severe MR. ?- TEE (1/23): EF 60-65%, mild RV dilation with normal RV systolic function, MR mild-moderate, moderate TR with vegetation on TV and pacemaker lead ?- Echo (1/23): EF 60-65%, normal RV, RVSP 51.5 mmHg, mild-moderate MR, moderate TR ?- Echo (3/23): EF 60-65%, mild LVH, normal RV, IVC normal, no definite vegetation noted.  ?FH: mother with CHF ?15. 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  ?16. Mitral regurgitation: moderate to severe on 3/22 echo.  ?17. Zio patch (3/22): 9.3% PACs, no AF, 2.9% PVCs ?18. 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  Deemed too high risk to remove device so have tried to treat through.  ? ?Current Outpatient Medications  ?Medication Sig Dispense Refill  ? acetaminophen (TYLENOL) 325 MG tablet Take 325-650 mg by mouth every 6 (six) hours as needed for  mild pain.    ? albuterol (ACCUNEB) 0.63 MG/3ML nebulizer solution Take 1 ampule by nebulization every 6 (six) hours as needed for wheezing.    ? amiodarone (PACERONE) 200 MG tablet Take 1 tablet (200 mg total) by mouth daily. 30 tablet 6  ? apixaban (ELIQUIS) 5 MG TABS tablet Take 5 mg by mouth 2 (two) times daily.    ? benzonatate (TESSALON) 200 MG capsule Take 1 capsule (200 mg total) by mouth 3 (three) times daily as needed for cough. 20 capsule 0  ? cefadroxil (DURICEF) 500 MG capsule Take 1 capsule (500 mg total) by mouth 2 (two) times daily. 60 capsule 2  ? Difluprednate 0.05 % EMUL Place 1 drop into both eyes 2 (two) times daily.    ? doxycycline (VIBRA-TABS) 100 MG tablet Take 1 tablet (100 mg total) by mouth 2 (two) times daily. 14 tablet 0  ? DULoxetine (CYMBALTA) 30 MG capsule Take 60 mg by mouth daily.    ? gabapentin (NEURONTIN) 300 MG capsule Take 2 capsules (600 mg total) by  mouth 3 (three) times daily. 540 capsule 1  ? macitentan (OPSUMIT) 10 MG tablet Take 1 tablet (10 mg total) by mouth daily. 90 tablet 3  ? meclizine (ANTIVERT) 25 MG tablet Take 1 tablet (25 mg total) by mouth 3 (three) times daily as needed for dizziness. 30 tablet 1  ? Menthol, Topical Analgesic, (BIOFREEZE EX) Apply 1 application topically 4 (four) times daily as needed (for pain).    ? midodrine (PROAMATINE) 10 MG tablet Take 1.5 tablets (15 mg total) by mouth 3 (three) times daily with meals. 135 tablet 3  ? potassium chloride SA (KLOR-CON M) 20 MEQ tablet Take 2 tablets (40 mEq total) by mouth 2 (two) times daily. 120 tablet 11  ? rosuvastatin (CRESTOR) 10 MG tablet Take 1 tablet by mouth once daily 90 tablet 0  ? Selexipag (UPTRAVI) 1600 MCG TABS Take 1 tablet (1,600 mcg total) by mouth 2 (two) times daily. 60 tablet 11  ? sildenafil (REVATIO) 20 MG tablet Take 20 mg by mouth 3 (three) times daily.    ? spironolactone (ALDACTONE) 25 MG tablet Take 25 mg by mouth daily.    ? torsemide (DEMADEX) 20 MG tablet Take 2 tablets  (40 mg total) by mouth 2 (two) times daily. 120 tablet 5  ? ?No current facility-administered medications for this encounter.  ? ?Allergies:   Aspirin and Other  ? ?Social History:  The patient  reports that s

## 2021-10-22 ENCOUNTER — Other Ambulatory Visit (HOSPITAL_COMMUNITY): Payer: Self-pay

## 2021-10-22 ENCOUNTER — Other Ambulatory Visit (HOSPITAL_COMMUNITY): Payer: Self-pay | Admitting: Cardiology

## 2021-10-22 ENCOUNTER — Encounter (HOSPITAL_COMMUNITY): Payer: Self-pay

## 2021-10-22 NOTE — Progress Notes (Signed)
Today had a home visit with [name].  Assisted her in signing up for the Ventricle program.  She is aware of her appt with Heart vascular tomorrow.  She appears to be taking all her medications.  She weighs daily and takes her blood pressure. She still has home health with PT coming in.  She states getting stronger.  She has edema in lower legs.  She states weight staying same. Discussed fluid and diet.  She denies any chest pain, headache, dizziness or increased shortness of breath. She is wearing her cpap and working well.  She states watching her high sodium foods and fluids.  She states gets thirsty at night.  Called in refills for her.  Filled her med boxes up for 2 weeks.  Will watch her cardiology notes for any med changes.  She denies chest pain or increased shortness of breath.  She wears oxygen 24 hrs a day.  She still have dizziness.  She is experiencing headaches.  She states has one all the time since her sinus infection.  She has been to ear, nose and throat and they states ears and sinus are clear.  She has everything for daily living.  She has good family support.  Will continue to visit for heart failure, diet and medication management.  ? ?Candis Kabel ? EMT-Paramedic ?[phone] ?

## 2021-10-23 ENCOUNTER — Encounter (HOSPITAL_COMMUNITY): Payer: Self-pay

## 2021-10-23 ENCOUNTER — Telehealth (HOSPITAL_COMMUNITY): Payer: Self-pay

## 2021-10-23 ENCOUNTER — Ambulatory Visit (HOSPITAL_COMMUNITY)
Admission: RE | Admit: 2021-10-23 | Discharge: 2021-10-23 | Disposition: A | Discharge status: Home / Self Care | Payer: Medicare HMO | Source: Ambulatory Visit | Attending: Family Medicine | Admitting: Family Medicine

## 2021-10-23 VITALS — BP 160/68 | HR 60 | Wt 182.6 lb

## 2021-10-23 DIAGNOSIS — I2510 Atherosclerotic heart disease of native coronary artery without angina pectoris: Secondary | ICD-10-CM | POA: Insufficient documentation

## 2021-10-23 DIAGNOSIS — Z950 Presence of cardiac pacemaker: Secondary | ICD-10-CM | POA: Diagnosis not present

## 2021-10-23 DIAGNOSIS — I951 Orthostatic hypotension: Secondary | ICD-10-CM | POA: Insufficient documentation

## 2021-10-23 DIAGNOSIS — Z7901 Long term (current) use of anticoagulants: Secondary | ICD-10-CM | POA: Diagnosis not present

## 2021-10-23 DIAGNOSIS — E1122 Type 2 diabetes mellitus with diabetic chronic kidney disease: Secondary | ICD-10-CM | POA: Insufficient documentation

## 2021-10-23 DIAGNOSIS — I10 Essential (primary) hypertension: Secondary | ICD-10-CM

## 2021-10-23 DIAGNOSIS — I480 Paroxysmal atrial fibrillation: Secondary | ICD-10-CM | POA: Diagnosis not present

## 2021-10-23 DIAGNOSIS — J9611 Chronic respiratory failure with hypoxia: Secondary | ICD-10-CM | POA: Diagnosis not present

## 2021-10-23 DIAGNOSIS — I890 Lymphedema, not elsewhere classified: Secondary | ICD-10-CM | POA: Insufficient documentation

## 2021-10-23 DIAGNOSIS — I2721 Secondary pulmonary arterial hypertension: Secondary | ICD-10-CM | POA: Diagnosis not present

## 2021-10-23 DIAGNOSIS — Z79899 Other long term (current) drug therapy: Secondary | ICD-10-CM | POA: Insufficient documentation

## 2021-10-23 DIAGNOSIS — J449 Chronic obstructive pulmonary disease, unspecified: Secondary | ICD-10-CM | POA: Insufficient documentation

## 2021-10-23 DIAGNOSIS — I130 Hypertensive heart and chronic kidney disease with heart failure and stage 1 through stage 4 chronic kidney disease, or unspecified chronic kidney disease: Secondary | ICD-10-CM | POA: Diagnosis not present

## 2021-10-23 DIAGNOSIS — I38 Endocarditis, valve unspecified: Secondary | ICD-10-CM | POA: Insufficient documentation

## 2021-10-23 DIAGNOSIS — Z9981 Dependence on supplemental oxygen: Secondary | ICD-10-CM | POA: Diagnosis not present

## 2021-10-23 DIAGNOSIS — I4891 Unspecified atrial fibrillation: Secondary | ICD-10-CM | POA: Diagnosis not present

## 2021-10-23 DIAGNOSIS — R42 Dizziness and giddiness: Secondary | ICD-10-CM

## 2021-10-23 DIAGNOSIS — Z86718 Personal history of other venous thrombosis and embolism: Secondary | ICD-10-CM

## 2021-10-23 DIAGNOSIS — N189 Chronic kidney disease, unspecified: Secondary | ICD-10-CM | POA: Insufficient documentation

## 2021-10-23 DIAGNOSIS — E871 Hypo-osmolality and hyponatremia: Secondary | ICD-10-CM

## 2021-10-23 DIAGNOSIS — Z9989 Dependence on other enabling machines and devices: Secondary | ICD-10-CM

## 2021-10-23 DIAGNOSIS — G4733 Obstructive sleep apnea (adult) (pediatric): Secondary | ICD-10-CM | POA: Insufficient documentation

## 2021-10-23 DIAGNOSIS — I5032 Chronic diastolic (congestive) heart failure: Secondary | ICD-10-CM | POA: Diagnosis not present

## 2021-10-23 LAB — BASIC METABOLIC PANEL
Anion gap: 9 (ref 5–15)
BUN: 43 mg/dL — ABNORMAL HIGH (ref 8–23)
CO2: 25 mmol/L (ref 22–32)
Calcium: 9.4 mg/dL (ref 8.9–10.3)
Chloride: 103 mmol/L (ref 98–111)
Creatinine, Ser: 1.95 mg/dL — ABNORMAL HIGH (ref 0.44–1.00)
GFR, Estimated: 26 mL/min — ABNORMAL LOW (ref >60)
Glucose, Bld: 162 mg/dL — ABNORMAL HIGH (ref 70–99)
Potassium: 6 mmol/L — ABNORMAL HIGH (ref 3.5–5.1)
Sodium: 137 mmol/L (ref 135–145)

## 2021-10-23 LAB — BRAIN NATRIURETIC PEPTIDE: B Natriuretic Peptide: 66.8 pg/mL (ref 0.0–100.0)

## 2021-10-23 MED ORDER — LOKELMA 10 G PO PACK
10.0000 g | PACK | Freq: Every day | ORAL | 0 refills | Status: AC
Start: 2021-10-23 — End: 2021-10-24

## 2021-10-23 MED ORDER — METOLAZONE 2.5 MG PO TABS: ORAL_TABLET | ORAL | 0 refills | Status: DC

## 2021-10-23 MED ORDER — TORSEMIDE 20 MG PO TABS: 60.0000 mg | ORAL_TABLET | Freq: Every day | ORAL | 5 refills | Status: DC

## 2021-10-23 NOTE — Progress Notes (Signed)
ReDS Vest / Clip - 10/23/21 1200   ? ?  ? ReDS Vest / Clip  ? Station Marker A   ? Ruler Value 27   ? ReDS Value Range High volume overload   ? ReDS Actual Value 45   ? ?  ?  ? ?  ? ? ?

## 2021-10-23 NOTE — Telephone Encounter (Signed)
-----   Message from Rafael Bihari, [place] sent at 10/23/2021  3:45 PM EDT ----- ?K and kidney function elevated. ? ?Stop spiro and all KCL supplementation. She needs a dose of Lokelma 10 g x 1 today. ? ?Do not take metolazone dose today as discussed. Increase daily torsemide to 60 mg bid. ? ?Repeat BMET on Monday. ?

## 2021-10-23 NOTE — Addendum Note (Signed)
Encounter addended by: Rafael Bihari, FNP on: 10/23/2021 1:25 PM ? Actions taken: Clinical Note Signed

## 2021-10-23 NOTE — Patient Instructions (Addendum)
Labs done today. We will contact you only if your labs are abnormal. ? ?Take 1 tablet of Metolazone 2.'5mg'$  (1 tablet) by mouth tomorrow with morning dose of Torsemide. Take an extra 68mq(2 tablets) of potassium as well.  ? ?No other medication changes were made. Please continue all current medications as prescribed. ? ?Your physician recommends that you schedule a follow-up appointment in: 1 week for a lab only appointment([facility] and in 3 months with Dr. MAundra Dubin  ? ?If you have any questions or concerns before your next appointment please send [place] message through mOasisor call our office at [phone]   ? ?TO LEAVE A MESSAGE FOR THE NURSE SELECT OPTION 2, PLEASE LEAVE A MESSAGE INCLUDING: ?YOUR NAME ?DATE OF BIRTH ?CALL BACK NUMBER ?REASON FOR CALL**this is important as we prioritize the call backs ? ?YOU WILL RECEIVE A CALL BACK THE SAME DAY AS LONG AS YOU CALL BEFORE 4:00 PM ? ? ?Do the following things EVERYDAY: ?Weigh yourself in the morning before breakfast. Write it down and keep it in a log. ?Take your medicines as prescribed ?Eat low salt foods--Limit salt (sodium) to 2000 mg per day.  ?Stay as active as you can everyday ?Limit all fluids for the day to less than 2 liters ? ? ?[facility] and your [facility] needs are our priority. As part of our continuing mission to provide you with exceptional heart care, we have created designated Provider Care Teams. These Care Teams include your primary Cardiologist (physician) and Advanced Practice Providers (APPs- Physician Assistants and Nurse Practitioners) who all work together to provide you with the care you need, when you need it.  ? ?You may see any of the following providers on your designated Care Team at your next follow up: ?Dr DGlori Bickers?Dr DLoralie Champagne?ADarrick Grinder NP ?BLyda Jester PA ?LAudry Riles PharmD ? ? ?Please be sure to bring in all your medications  bottles to every appointment.  ? ?

## 2021-10-23 NOTE — Telephone Encounter (Signed)
Patient advised and verbalized understanding. Med list updated to reflect changes.  ?Meds ordered this encounter  ?Medications  ? torsemide (DEMADEX) 20 MG tablet  ?  Sig: Take 3 tablets (60 mg total) by mouth daily.  ?  Dispense:  120 tablet  ?  Refill:  5  ?  Please cancel all previous orders for current medication. Change in dosage or pill size.  ? sodium zirconium cyclosilicate (LOKELMA) 10 g PACK packet  ?  Sig: Take 10 g by mouth daily for 1 dose.  ?  Dispense:  1 packet  ?  Refill:  0  ? ? ?

## 2021-10-24 ENCOUNTER — Other Ambulatory Visit (HOSPITAL_COMMUNITY): Payer: Self-pay

## 2021-10-28 ENCOUNTER — Telehealth: Payer: Self-pay

## 2021-10-28 NOTE — Telephone Encounter (Signed)
Kendrick Fries called from [employer] to let Dr. [name] know that they have been unable to reach patient regarding her oxygen prescription. ?

## 2021-10-28 NOTE — Telephone Encounter (Signed)
Please call the patient and let them know that Debra Saunders has been trying to contact her regarding her oxygen prescription.  ?

## 2021-10-29 ENCOUNTER — Other Ambulatory Visit (HOSPITAL_COMMUNITY): Payer: Self-pay

## 2021-10-29 DIAGNOSIS — J449 Chronic obstructive pulmonary disease, unspecified: Secondary | ICD-10-CM | POA: Diagnosis not present

## 2021-10-29 NOTE — Telephone Encounter (Signed)
I called and spoke with the patients daughter and I informed her that [employer] was trying to reach the patient, she took the phone number and extension and stated they will call them.  Sylvie Mifsud,cma  ?

## 2021-10-31 ENCOUNTER — Other Ambulatory Visit
Admission: RE | Admit: 2021-10-31 | Discharge: 2021-10-31 | Disposition: A | Discharge status: Home / Self Care | Payer: Medicare HMO | Attending: Family Medicine | Admitting: Family Medicine

## 2021-10-31 DIAGNOSIS — R42 Dizziness and giddiness: Secondary | ICD-10-CM | POA: Diagnosis not present

## 2021-10-31 DIAGNOSIS — I5032 Chronic diastolic (congestive) heart failure: Secondary | ICD-10-CM | POA: Insufficient documentation

## 2021-10-31 LAB — BASIC METABOLIC PANEL
Anion gap: 9 (ref 5–15)
BUN: 37 mg/dL — ABNORMAL HIGH (ref 8–23)
CO2: 30 mmol/L (ref 22–32)
Calcium: 9.4 mg/dL (ref 8.9–10.3)
Chloride: 101 mmol/L (ref 98–111)
Creatinine, Ser: 1.26 mg/dL — ABNORMAL HIGH (ref 0.44–1.00)
GFR, Estimated: 44 mL/min — ABNORMAL LOW (ref >60)
Glucose, Bld: 111 mg/dL — ABNORMAL HIGH (ref 70–99)
Potassium: 3.3 mmol/L — ABNORMAL LOW (ref 3.5–5.1)
Sodium: 140 mmol/L (ref 135–145)

## 2021-11-01 ENCOUNTER — Telehealth (HOSPITAL_COMMUNITY): Payer: Self-pay

## 2021-11-01 MED ORDER — POTASSIUM CHLORIDE CRYS ER 20 MEQ PO TBCR: 40.0000 meq | EXTENDED_RELEASE_TABLET | Freq: Every day | ORAL | 3 refills | Status: DC

## 2021-11-01 NOTE — Telephone Encounter (Signed)
Patients daughter, [name] advised and verbalized understanding. Patient already has a pending lab appt scheduled, med list updated to reflect changes.  ? ?Meds ordered this encounter  ?Medications  ? potassium chloride SA (KLOR-CON M20) 20 MEQ tablet  ?  Sig: Take 2 tablets (40 mEq total) by mouth daily.  ?  Dispense:  180 tablet  ?  Refill:  3  ?  Please cancel all previous orders for current medication. Change in dosage or pill size.  ? ?. ?

## 2021-11-01 NOTE — Telephone Encounter (Signed)
-----   Message from Rafael Bihari, [place] sent at 11/01/2021  7:56 AM EDT ----- ?Kidney function improved but K now low. ? ?Restart 40 KCL daily.   ? ?Repeat BMET in 7-10 days ?

## 2021-11-11 ENCOUNTER — Telehealth: Payer: Self-pay

## 2021-11-11 NOTE — Telephone Encounter (Signed)
I called to [employer] they are the ones supplying her oxygen and they informed me to have the patient call them at [phone] so they can speak with her and troubleshoot her issue with the concentrator, she took the number and stated she would call them.  Idamae Coccia,cma

## 2021-11-11 NOTE — Telephone Encounter (Signed)
Patient states the concentrator is broken on her oxygen device.  Patient states she has to cut it off and it starts back in an hour or so.  Patient states she has called the number on her tank, which is for [employer] in [place], and they stated they don't know anything about it.  Patient states she called [employer] [facility] in [place] and they stated they don't have anything to do with her oxygen, they are only involved with her CPAP machine and nebulizer.  Patient states she would like to know what to do.  Please call.

## 2021-11-13 ENCOUNTER — Encounter (HOSPITAL_COMMUNITY): Payer: Self-pay

## 2021-11-13 ENCOUNTER — Other Ambulatory Visit (HOSPITAL_COMMUNITY): Payer: Self-pay

## 2021-11-13 ENCOUNTER — Other Ambulatory Visit (HOSPITAL_COMMUNITY): Payer: Self-pay | Admitting: Cardiology

## 2021-11-13 ENCOUNTER — Other Ambulatory Visit (HOSPITAL_COMMUNITY): Payer: Self-pay | Admitting: *Deleted

## 2021-11-13 DIAGNOSIS — E785 Hyperlipidemia, unspecified: Secondary | ICD-10-CM

## 2021-11-13 MED ORDER — ROSUVASTATIN CALCIUM 10 MG PO TABS: 10.0000 mg | ORAL_TABLET | Freq: Every day | ORAL | 3 refills | Status: DC

## 2021-11-14 NOTE — Progress Notes (Signed)
Had a home visit with [name].  She states she has been doing good.  She is being very active in her home.  She sits at her dining room table for a god part of the day.  She has edema in her legs up to her hip area.  Advised she need to get them legs above her heart to get the fluid out.  Any sugestions I made she did not want to do them.  She states using the abdominal binder a couple of days a week and using the leg pumps a few times.  She states tries to watch her high salty foods, she ate zucchini fries for lunch.  She cooked a meal for her family on Sunday, she states feels better, her dizziness is not as bad since decrease in gabapentin.  She states her hearing is better.  She complains of pain in her eyes, she has been to eye doctor, started after cateract sugery, she had infection in them.  She appears to taking all her medications. She has everything for daily living.  She is aware of up coming appts, she will need refill on her antibiotics when goes to infectious disease.  She denies pain today or increased in shortness of breath.  Will continue to visit for heart failure, diet and medication management.   [place] [phone]

## 2021-11-19 NOTE — Progress Notes (Incomplete)
Date:  11/19/2021   ID:  Debra Saunders, DOB 13-Dec-1945, MRN [id number]   Provider location: [facility] Type of Visit: Established patient   PCP:  Debra Haven, MD  Cardiologist:  Debra Sacramento, MD HF cardiologist: Dr. Aundra Saunders  Chief Complaint: HF follow up   History of Present Illness: Debra Saunders is a 76 y.o. 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   Admitted 10/24-10/29/19 with acute on chronic diastolic HF. HF team consulted with concerns for low output. PICC was placed and milrinone to support RV function was started along with IV Lasix to facilitate diuresis. RHC done to evaluate PAH (see below). V/Q scan negative. High res CT showed pulmonary fibrosis. Pulmonary consult and recommended stopping amiodarone. She was transitioned to torsemide 60 mg BID. DC weight: 226 lbs.    She has started on selexipag for pulmonary hypertension in addition to Opsumit and Revatio.      Echo in 3/21 showed EF 60-65%, normal RV, PASP 50 mmHg, moderate-severe MR.   Had [place] 08/30/2020- low/normal filling pressures, mild pumonary htn, and preserved cardiac. output.   Admitted to [facility] 1/23 with endocarditis and PNA, requiring pressors. BCx positive for Streptococcus group G.  All her PAH meds were held. TEE showed a large vegetation on the pacer wire in the right atrium above the tricuspid valve (1 x 1.5 cm).  No vegetations noted on the valves.  EF was 55% and there was note of mild to moderate mitral regurgitation. RV ok. She was transferred to [facility] for pacemaker extraction. EP felt she was not a candidate due to multiple comorbidities. ID consulted  and planned for 6 weeks of IV abx. She developed Afib with RVR and started on amio. She underwent successful TEE-guided DCCV 07/14/21. Hospitalization complicated by anemia, hypotension, and AKI.  Midodrine started during admission. She was discharged to SNF, weight 198 lbs.  Admitted 3/23 with lightheadedness. Treated for sinus infection, HF meds held due to othostasis. Midodrine increased to 10 mg tid. HH PT arranged.  Repeat echo in 3/23 showed EF 60-65%, mild LVH, normal RV, IVC normal, no definite vegetation noted.   Follow up 4/23, she was volume overloaded and torsemide increased to 40 bid, midodrine increased to 15 mg tid.   Today she returns for HF follow up with her daughter. Overall feeling fair, breathing is somewhat better on higher dose of torsemide. Dizziness remains biggest complaint. She has dyspnea with washing dishes. Legs swell more in the evening but improve in the morning. Denies palpitations or CP. Chronically sleeps reclined in hospital bed. Appetite ok. No fever or chills. Weight at home 179-180 pounds. Taking all medications. Complaint with abd binder and compression hose. Asking about home use of PureWick. Drinking well over 2 L/fluids a day.  ReDs: 45%  6 minute walk (1/20): 98 m 6 minute walk (7/20): 213 m 6 minute walk (3/22): 198 m 6 minute walk (10/22): 229 m   Labs (11/19): digoxin 0.8 Labs (1/20): K 4.2, creatinine 1.36 => 1.33, digoxin 0.8 Labs (2/20): digoxin 0.2, K 4.3, creatinine 1.32 Labs (5/20): K 4.5, creatinine 1.4, digoxin 0.8 Labs (1/22): K 4, creatinine 1.07 Labs (8/22): K 4.1, creatinine  1.03 Labs (1/23): K 3.7, creatinine 1.29 Labs (1/23): K 4.7, creatinine 1.07 Labs (3/23): K 4.3, creatinine 1.24 => 1.07, hgb 9.4 Labs (4/23): K 5.0, creatinine 1.58, TSH normal, LFTs normal   PMH: 1. Lymphedema: Left lower leg (chronic).  2. COPD 3. 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  - PFTs (2017) with FEV1 64%, FEV1/FVC ratio  106%, TLC 49%, DLCO 39% (restrictive).  - CT chest high resolution (10/19): Pulmonary parenchymal pattern of fibrosis.  - CT chest (4/22): Chronic ILD 4. Atrial fibrillation: Paroxysmal.  She was previously off amiodarone due to lung disease.  - 2/19 DCCV.  - 1/23 DCCV 5. H/o DVT s/p IVC filter in 2004.  6. CAD: 4/19 coronary angiography with minimal coronary disease.  7. Bradycardia: [employer] PPM.  8. H/o traumatic SAH 2014.  9. CKD: Stage 3.  10. H/o CVA 11. OSA: CPAP 12. HTN 13. Type II diabetes 14. Pulmonary hypertension:  - PFTs 2017 with severe restriction.  - Echo (10/19): EF 60-65%, PASP 52 mmHg - RHC (4/19): Severe PAH with PVR 7.2 WU.  - RHC (10/19): mean RA 6, PA 56/17 mean 33, PCWP mean 12, CI 3.23, PVR 3.4 WU.  - RHC (3/22): mean 3, RV 30/1, PA 33/11, mean 20, PCWP mean 6, Oxygen saturations: PA 69% AO 100%, Cardiac Output (Fick) 6.18, Cardiac Index (Fick) 3.36, and PVR 2.3 WU - V/Q scan (10/19): No chronic PE.  - Anti-SCL 70 and ANA negative.  ACE normal.  RF slightly elevated at 15.  - Echo (3/21): EF 60-65%, normal RV, PASP 50 mmHg, moderate-severe MR. - TEE (1/23): EF 60-65%, mild RV dilation with normal RV systolic function, MR mild-moderate, moderate TR with vegetation on TV and pacemaker lead - Echo (1/23): EF 60-65%, normal RV, RVSP 51.5 mmHg, mild-moderate MR, moderate TR - Echo (3/23): EF 60-65%, mild LVH, normal RV, IVC normal, no definite vegetation noted.  FH: mother with CHF 13. 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  16. Mitral regurgitation: moderate to severe on 3/22 echo.  17. Zio patch (3/22): 9.3% PACs, no AF, 2.9% PVCs 18. 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  Deemed too high risk to remove device so have tried to treat through.   Current Outpatient Medications  Medication Sig Dispense Refill   acetaminophen (TYLENOL) 325 MG tablet Take 325-650 mg by mouth every 6 (six) hours as needed for  mild pain.     albuterol (ACCUNEB) 0.63 MG/3ML nebulizer solution Take 1 ampule by nebulization every 6 (six) hours as needed for wheezing.     amiodarone (PACERONE) 200 MG tablet Take 1 tablet (200 mg total) by mouth daily. 30 tablet 6   apixaban (ELIQUIS) 5 MG TABS tablet Take 5 mg by mouth 2 (two) times daily.     benzonatate (TESSALON) 200 MG capsule Take 1 capsule (200 mg total) by mouth 3 (three) times daily as needed for cough. 20 capsule 0   cefadroxil (DURICEF) 500 MG capsule Take 1 capsule (500 mg total) by mouth 2 (two) times daily. 60 capsule 2   Difluprednate 0.05 % EMUL Place 1 drop into both eyes 2 (two) times daily.     doxycycline (VIBRA-TABS) 100 MG tablet Take 1 tablet (100 mg total) by mouth 2 (two) times daily. 14 tablet 0   DULoxetine (CYMBALTA) 30 MG capsule Take 60 mg by mouth daily.     gabapentin (NEURONTIN) 300 MG capsule Take 2 capsules (600 mg total) by  mouth 3 (three) times daily. (Patient taking differently: Take 300 mg by mouth 3 (three) times daily. Taking 300 mg in am, noon and 600 mg at night) 540 capsule 1   macitentan (OPSUMIT) 10 MG tablet Take 1 tablet (10 mg total) by mouth daily. 90 tablet 3   meclizine (ANTIVERT) 25 MG tablet Take 1 tablet (25 mg total) by mouth 3 (three) times daily as needed for dizziness. 30 tablet 1   Menthol, Topical Analgesic, (BIOFREEZE EX) Apply 1 application topically 4 (four) times daily as needed (for pain).     metolazone (ZAROXOLYN) 2.5 MG tablet Take 1 tablet by mouth as directed by the HF Clinic (Patient not taking: Reported on 11/13/2021) 1 tablet 0   midodrine (PROAMATINE) 10 MG tablet Take 1.5 tablets (15 mg total) by mouth 3 (three) times daily with meals. 135 tablet 3   potassium chloride SA (KLOR-CON M20) 20 MEQ tablet Take 2 tablets (40 mEq total) by mouth daily. 180 tablet 3   rosuvastatin (CRESTOR) 10 MG tablet Take 1 tablet (10 mg total) by mouth daily. 90 tablet 3   Selexipag (UPTRAVI) 1600 MCG TABS Take 1 tablet  (1,600 mcg total) by mouth 2 (two) times daily. 60 tablet 11   sildenafil (REVATIO) 20 MG tablet Take 20 mg by mouth 3 (three) times daily.     torsemide (DEMADEX) 20 MG tablet Take 3 tablets (60 mg total) by mouth daily. 120 tablet 5   No current facility-administered medications for this visit.   Allergies:   Aspirin and Other   Social History:  The patient  reports that she quit smoking about 31 years ago. Her smoking use included cigarettes. She has a 0.60 pack-year smoking history. She has never used smokeless tobacco. She reports that she does not drink alcohol and does not use drugs.   Family History:  The patient's family history includes Bone cancer in her sister; Breast cancer in her maternal aunt and sister; COPD in her mother; Heart disease in her mother; Hypertension in her father, mother, sister, and sister; Thyroid disease in her mother, sister, and sister.   ROS:  Please see the history of present illness.   All other systems are personally reviewed and negative.   Physical Exam:   General:  NAD. No resp difficulty, arrived in [facility] on oxygen HEENT: Normal Neck: Supple. JV 7-8. Carotids 2+ bilat; no bruits. No lymphadenopathy or thryomegaly appreciated. Cor: PMI nondisplaced. Regular rate & rhythm. No rubs, gallops, 2/6 SEM RUSB Lungs: Clear, diminished in bases Abdomen: Soft, nontender, nondistended. No hepatosplenomegaly. No bruits or masses. Good bowel sounds. Extremities: No cyanosis, clubbing, rash, chronic LLE edema Neuro: Alert & oriented x 3, cranial nerves grossly intact. Moves all 4 extremities w/o difficulty. Affect pleasant.  Recent Labs: 08/27/2021: Magnesium 2.5 08/30/2021: Hemoglobin 9.4; Platelets 293.0 09/26/2021: ALT 9; TSH 0.463 10/23/2021: B Natriuretic Peptide 66.8 10/31/2021: BUN 37; Creatinine, Ser 1.26; Potassium 3.3; Sodium 140  Personally reviewed   Wt Readings from Last 3 Encounters:  11/13/21 88.5 kg (195 lb)  10/23/21 82.8 kg (182 lb 9.6 oz)   10/22/21 77.6 kg (171 lb)    There were no vitals taken for this visit.  ASSESSMENT AND PLAN: 1. Atrial fibrillation with tachy-brady syndrome:  Had been off amiodarone since 2019 due to interstitial lung disease/pulmonary fibrosis. Amiodarone restarted this admission 1/23. s/p TEE-guided DCCV 1/23. She is regular on exam today.  - Continue amiodarone 200 mg daily. LFTs/TSH ok 4/23. Will need regular eye exam.  -  Continue Eliquis 5 mg bid.   2. 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 NYHA III, functional class difficult due to fatigue and physical deconditioning. She has orthostatic symptoms and uses midodrine. She remains volume overloaded today on exam, REDs 45%. Weight is down 4 lbs. - Take metolazone 2.5 mg + extra 40 KCL with tomorrow's AM dose of torsemide. BMET/BNP today, repeat BMET in 1 week. - Continue torsemide 40 mg bid.  - Continue spiro 12.5 mg daily. - Will not start SGLT2 inhibitor given issues with orthostasis. - We discussed limiting her fluids to < 2L/day. 3. Pulmonary hypertension: Based on 4/19 cath, she had severe PAH with PVR 7.2 WU. Chronic hypoxemic respiratory failure, on home oxygen.  She has a history of remote DVT (2004) but V/Q scan negative for evidence of chronic PE.  High resolution CT chest in 10/19 showed pulmonary fibrosis, and PFTs in 2017 showed severe restriction.  Serological workup for rheumatological diseases was unremarkable.  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 Amiodarone was stopped in past given lung parenchymal disease. 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  - She is back on sildenafil 20 tid and macitentan 10 daily.  - Selexipag  home dose 1600 bid.   - Home oxygen, CPAP.  4. Endocarditis: in 1/23.  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  Group G strep bacteremia.  Septic emboli to lungs. Seen by EP, thought to be too high risk for device extraction. Unfortunately she will be at high risk for complications going forward.  - She is now on lifetime suppression with cefadroxil.  5. Chronic hypoxemic respiratory failure:  At baseline, on 4L home oxygen with CPAP at night for interstitial lung disease and OSA.   - Continue home O2 4 L by  (baseline).  6. HTN: BP elevated today, but generally well controlled with PT checks at home (120's/50's yesterday). 7. H/o DVT: Anticoagulated.  8. Left leg lymphedema: Chronic.  9. OSA: CPAP.  10. Orthostatic hypotension: Ongoing symptoms.  - She should continue abdominal binder and needs to wear compression stockings daily.   - Continue midodrine 15 mg tid.   Follow up in 3-4 weeks with APP.   Signed, Rafael Bihari, FNP  11/19/2021  [facility] [address] [facility] [place] [address] [phone] (office) [phone] (fax)

## 2021-11-21 ENCOUNTER — Encounter (HOSPITAL_COMMUNITY): Payer: Medicare HMO

## 2021-11-25 ENCOUNTER — Ambulatory Visit (INDEPENDENT_AMBULATORY_CARE_PROVIDER_SITE_OTHER): Payer: Medicare HMO | Admitting: Internal Medicine

## 2021-11-25 ENCOUNTER — Other Ambulatory Visit: Payer: Self-pay

## 2021-11-25 ENCOUNTER — Encounter: Payer: Self-pay | Admitting: Internal Medicine

## 2021-11-25 VITALS — BP 117/69 | HR 59 | Temp 98.7°F | Ht 60.0 in

## 2021-11-25 DIAGNOSIS — Z950 Presence of cardiac pacemaker: Secondary | ICD-10-CM | POA: Diagnosis not present

## 2021-11-25 DIAGNOSIS — R7881 Bacteremia: Secondary | ICD-10-CM | POA: Diagnosis not present

## 2021-11-25 NOTE — Patient Instructions (Signed)
Continue your antibiotics  Labs today   See me in 1 month  See me/call me sooner if redness/or balloon sensation or increasing pain at pacer site    Ct is ordered for [facility] to be scheduled within next 2 weeks

## 2021-11-25 NOTE — Progress Notes (Addendum)
Subjective:    Patient ID: Debra Saunders, female    DOB: 1946/03/31, 76 y.o.   MRN: [id number]  Chief Complaint  Patient presents with   Follow-up    HPI:  Debra Saunders is a 76 y.o. 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 Initially started on ceftriaxone and passed Penicillin allergy test and was narrowed to Pencillin. She was not a surgical candidate to have the device removed or exchanged secondary to her multiple co-morbidities. Plan was for Penicillin for 6 weeks followed by oral suppression. Prior to discharge to skilled facility change of medication was requested and Cefazolin was started. End date of IV treatment is 08/08/21. Here today for hospitalization follow up.  [name] has been receiving her Cefazolin as prescribed with no adverse side effects. Overall feeling well today and has been having fatigue. Has questions about how the infection is doing. PICC line is functioning appropriately. Working with physical and occupational therapy at her facility. No lab work is available for review.    ----------------- 09/06/2021 id clinic follow up She is having some loose stool only with eating, but feels well, and having good appetite She denies f/c/n/v/rash She is taking cefadroxil 500 mg bid She reports "my pacer moved" and described some "raised" skin above the pacer. No pain/discomfort I asked her to send me picture of that via mychart, and if not able to then she needs to come here physically next week  Other ros negative  ------------------- 11/25/21 id clinic f/u Doing well on supressive antibiotics cefadroxil 500 twice a day for group g strep bacteremia/pacemaker infection with retained pacer No n/v/d No fever, chill  Patient thinks the left pocket site has been sore since  last visit with me    Allergies  Allergen Reactions   Aspirin Anaphylaxis and Hives    Pt states that she is unable to take because she had bleeding in her brain   Other Other (See Comments)    Pt states that she is unable to take blood thinners because she had bleeding in her brain; Blood Thinners-per doctor at [facility] (Xarelto)      Outpatient Medications Prior to Visit  Medication Sig Dispense Refill   acetaminophen (TYLENOL) 325 MG tablet Take 325-650 mg by mouth every 6 (six) hours as needed for mild pain.     albuterol (ACCUNEB) 0.63 MG/3ML nebulizer solution Take 1 ampule by nebulization every 6 (six) hours as needed for wheezing.     amiodarone (PACERONE) 200 MG tablet Take 1 tablet (200 mg total) by mouth daily. 30 tablet 6   apixaban (ELIQUIS) 5 MG TABS tablet Take 5 mg by mouth 2 (two) times daily.     cefadroxil (DURICEF) 500 MG capsule Take 1 capsule (500 mg total) by mouth 2 (two) times daily. 60 capsule 2   Difluprednate 0.05 % EMUL Place 1 drop into both eyes 2 (two) times daily.     doxycycline (VIBRA-TABS) 100 MG tablet Take 1 tablet (100 mg total) by mouth 2 (two) times daily. 14 tablet 0   DULoxetine (CYMBALTA) 30 MG capsule Take 60 mg by mouth daily.     gabapentin (NEURONTIN) 300 MG capsule Take 2 capsules (600 mg total) by mouth 3 (three) times daily. (Patient taking differently: Take 300 mg by mouth 3 (three) times daily. Taking  300 mg in am, noon and 600 mg at night) 540 capsule 1   macitentan (OPSUMIT) 10 MG tablet Take 1 tablet (10 mg total) by mouth daily. 90 tablet 3   meclizine (ANTIVERT) 25 MG tablet Take 1 tablet (25 mg total) by mouth 3 (three) times daily as needed for dizziness. 30 tablet 1   Menthol, Topical Analgesic, (BIOFREEZE EX) Apply 1 application topically 4 (four) times daily as needed (for pain).     midodrine (PROAMATINE) 10 MG tablet Take 1.5 tablets (15 mg total) by mouth 3 (three) times daily with meals. 135 tablet 3   potassium  chloride SA (KLOR-CON M20) 20 MEQ tablet Take 2 tablets (40 mEq total) by mouth daily. 180 tablet 3   rosuvastatin (CRESTOR) 10 MG tablet Take 1 tablet (10 mg total) by mouth daily. 90 tablet 3   Selexipag (UPTRAVI) 1600 MCG TABS Take 1 tablet (1,600 mcg total) by mouth 2 (two) times daily. 60 tablet 11   sildenafil (REVATIO) 20 MG tablet Take 20 mg by mouth 3 (three) times daily.     torsemide (DEMADEX) 20 MG tablet Take 3 tablets (60 mg total) by mouth daily. 120 tablet 5   benzonatate (TESSALON) 200 MG capsule Take 1 capsule (200 mg total) by mouth 3 (three) times daily as needed for cough. 20 capsule 0   metolazone (ZAROXOLYN) 2.5 MG tablet Take 1 tablet by mouth as directed by the HF Clinic (Patient not taking: Reported on 11/13/2021) 1 tablet 0   No facility-administered medications prior to visit.     Past Medical History:  Diagnosis Date   Arthritis    Atrial fibrillation, persistent ([place])    a. s/p TEE-guided DCCV on 08/20/2017   Cardiorenal syndrome    a. 09/2017 Creat rose to 2.7 w/ diuresis-->HD x 2.   Chest pain    a. 01/2012 Cath: nonobs dzs;  c. 04/2014 MV: No ischemia.   Chronic back pain    Chronic combined systolic (congestive) and diastolic (congestive) heart failure ([place])    a. Dx 2005 @ [facility];  b. 02/2014 Echo: EF 55-60%; c. 07/2017: EF 30-35%, diffuse HK, trivial AI, severe MR, moderate TR; d. 09/2017 Echo: EF 40-45%; e. 02/2018 Echo: EF 55-60%, Gr1 DD; f. 08/2019 Echo: EF 60-65%, Gr2 DD, Nl RV fxn, RVSP 49.60mHg. Mod-Sev MR. Mild to mod TR. Mod PR. RA ~ 862mg.   Chronic respiratory failure (HCC)    a. on home O2   Gastritis    History of intracranial hemorrhage    a. 6/14 described by neurosurgery as "small frontal posttraumatic SAH " - pt was on xarelto @ the time.   Hyperlipidemia    Hypertension    Interstitial lung disease (HCC)    Lipoma of back    Lymphedema    a. Chronic LLE edema.   NICM (nonischemic cardiomyopathy) ([facility] [place]   a. 09/2017 Echo: EF 40-45%; b.  09/2017 Cath: minor, insignificant CAD; c. 02/2018 Echo: EF 55-60%, Gr1 DD; d. 08/2019 Echo: EF 60-65%, Gr2 DD.   Obesity    PAH (pulmonary artery hypertension) ([facility] [place]   a. 09/2017 Echo: PASP 6578m; b. 09/2017 RHC: RA 23, RV 84/13, PA 84/29, PCWP 20, CO 3.89, CI 1.89. LVEDP 18.   Presence of permanent cardiac pacemaker 01/09/2014   Sepsis with metabolic encephalopathy ([facility]  Sleep apnea    Streptococcal bacteremia    Stroke ([facility]   Subarachnoid hemorrhage ([place]/30/2014   Symptomatic bradycardia    a.  s/p BSX PPM.   Thyroid nodule    Type II diabetes mellitus ([place])    Urinary incontinence      Past Surgical History:  Procedure Laterality Date   ABDOMINAL HYSTERECTOMY  1969   BREAST BIOPSY Right 10/28/2019   lymph node bx, [place] Bx, pending path    CARDIAC CATHETERIZATION  2013   @ [facility]: No obstructive CAD: Only 20% ostial left circumflex.   CARDIOVERSION N/A 10/12/2017   Procedure: CARDIOVERSION;  Surgeon: Minna Merritts, MD;  Location: [facility];  Service: Cardiovascular;  Laterality: N/A;   CARDIOVERSION N/A 07/04/2021   Procedure: CARDIOVERSION;  Surgeon: Larey Dresser, MD;  Location: [facility] ENDOSCOPY;  Service: Cardiovascular;  Laterality: N/A;   CATARACT EXTRACTION W/PHACO Right 09/19/2020   Procedure: CATARACT EXTRACTION PHACO AND INTRAOCULAR LENS PLACEMENT (IOC) RIGHT DIABETIC 3.95 00:43.0 9.2%;  Surgeon: Leandrew Koyanagi, MD;  Location: [facility];  Service: Ophthalmology;  Laterality: Right;  Pt requests to be last sleep apnea   CATARACT EXTRACTION W/PHACO Left 10/03/2020   Procedure: CATARACT EXTRACTION PHACO AND INTRAOCULAR LENS PLACEMENT ([place]) LEFT DIABETIC;  Surgeon: Leandrew Koyanagi, MD;  Location: [facility];  Service: Ophthalmology;  Laterality: Left;  3.25 0:59.3 5.5%   CHOLECYSTECTOMY     COLONOSCOPY WITH PROPOFOL N/A 09/10/2018   Procedure: COLONOSCOPY WITH PROPOFOL;  Surgeon: Manya Silvas, MD;  Location: [facility] ENDOSCOPY;  Service:  Endoscopy;  Laterality: N/A;   CYST REMOVAL TRUNK  2002   BACK   DIALYSIS/PERMA CATHETER INSERTION N/A 10/06/2017   Procedure: DIALYSIS/PERMA CATHETER INSERTION;  Surgeon: Katha Cabal, MD;  Location: [facility] CV LAB;  Service: Cardiovascular;  Laterality: N/A;   INSERT / REPLACE / REMOVE PACEMAKER     lipoma removal  2014   back   PACEMAKER INSERTION  2015   [employer] dual chamber pacemaker implanted by Dr Caryl Comes for symptomatic bradycardia   PERMANENT PACEMAKER INSERTION N/A 01/09/2014   Procedure: PERMANENT PACEMAKER INSERTION;  Surgeon: Deboraha Sprang, MD;  Location[facility];  Service: Cardiovascular;  Laterality: N/A;   RIGHT HEART CATH N/A 04/19/2018   Procedure: RIGHT HEART CATH;  Surgeon: Larey Dresser, MD;  Location: [facility] LAB;  Service: Cardiovascular;  Laterality: N/A;   RIGHT HEART CATH N/A 08/30/2020   Procedure: RIGHT HEART CATH;  Surgeon: Larey Dresser, MD;  Location: [facility] CV LAB;  Service: Cardiovascular;  Laterality: N/A;   RIGHT/LEFT HEART CATH AND CORONARY ANGIOGRAPHY N/A 10/19/2017   Procedure: RIGHT/LEFT HEART CATH AND CORONARY ANGIOGRAPHY;  Surgeon: Wellington Hampshire, MD;  Location: [place] [facility] CV LAB;  Service: Cardiovascular;  Laterality: N/A;   TEE WITHOUT CARDIOVERSION N/A 08/20/2017   Procedure: TRANSESOPHAGEAL ECHOCARDIOGRAM (TEE) & Direct current cardioversion;  Surgeon: Minna Merritts, MD;  Location: [facility] ORS;  Service: Cardiovascular;  Laterality: N/A;   TEE WITHOUT CARDIOVERSION N/A 06/28/2021   Procedure: TRANSESOPHAGEAL ECHOCARDIOGRAM (TEE);  Surgeon: Minna Merritts, MD;  Location: [facility] ORS;  Service: Cardiovascular;  Laterality: N/A;   TEE WITHOUT CARDIOVERSION N/A 07/04/2021   Procedure: TRANSESOPHAGEAL ECHOCARDIOGRAM (TEE);  Surgeon: Larey Dresser, MD;  Location: [facility] ENDOSCOPY;  Service: Cardiovascular;  Laterality: N/A;   TRIGGER FINGER RELEASE         ROS: All other ROS negative     Objective:     BP 117/69   Pulse (!) 59   Temp 98.7 F (37.1 C) (Oral)   Ht 5' (1.524 m)   SpO2 91% Comment: Patient on O2,  4LPM  BMI 38.08 kg/m  Nursing note and vital signs reviewed.  Physical exam: General/constitutional: no distress, pleasant; on home oxygen; conversant HEENT: Normocephalic, PER, Conj Clear, EOMI, Oropharynx clear Neck supple CV: rrr no mrg Lungs: clear to auscultation, normal respiratory effort Abd: Soft, Nontender Ext: no edema Skin/msk - slightly tender about pocket site; no fluctuance or redness    Labs: Lab Results  Component Value Date   WBC 7.9 08/30/2021   HGB 9.4 (L) 08/30/2021   HCT 29.6 (L) 08/30/2021   MCV 85.0 08/30/2021   PLT 293.0 16/03/9603   Last metabolic panel Lab Results  Component Value Date   GLUCOSE 111 (H) 10/31/2021   NA 140 10/31/2021   K 3.3 (L) 10/31/2021   CL 101 10/31/2021   CO2 30 10/31/2021   BUN 37 (H) 10/31/2021   CREATININE 1.26 (H) 10/31/2021   EGFR 53 (L) 08/29/2021   CALCIUM 9.4 10/31/2021   PHOS 3.1 10/19/2017   PROT 6.9 09/26/2021   ALBUMIN 3.3 (L) 09/26/2021   LABGLOB 3.4 08/29/2021   AGRATIO 1.1 (L) 08/29/2021   BILITOT 0.6 09/26/2021   ALKPHOS 93 09/26/2021   AST 13 (L) 09/26/2021   ALT 9 09/26/2021   ANIONGAP 9 10/31/2021     Assessment & Plan:    Patient Active Problem List   Diagnosis Date Noted   Head injury 08/30/2021   Stage 3b chronic kidney disease (CKD) ([place]) 08/23/2021   Anemia due to chronic kidney disease 08/23/2021   History of endocarditis 08/22/2021   Orthostatic hypotension 08/22/2021   Generalized weakness 08/21/2021   History of COVID-19    Infection due to implanted cardiac device (HCC) 07/25/2021   Abdominal pain 07/06/2021   Pressure injury of skin 07/01/2021   GI bleeding 06/25/2021   Black stool 06/25/2021   Severe sepsis with septic shock secondary to infected pacemaker with streptoccos bacteremia  06/25/2021   HTN (hypertension)    HLD (hyperlipidemia)    Chronic  diastolic CHF (congestive heart failure) (HCC)    Acute renal failure superimposed on stage 3a chronic kidney disease (HCC)    DVT (deep venous thrombosis) (HCC)    Hyponatremia    Thrombocytopenia (HCC)    Diarrhea    Burn 04/30/2020   Obesity (BMI 30-39.9) 04/30/2020   Elevated alkaline phosphatase level 04/30/2020   Chronic diarrhea 11/04/2019   Dysuria 09/29/2019   Abnormal mammogram 09/29/2019   Breast pain, right 08/08/2019   Bilateral ovarian cysts 08/08/2019   B12 deficiency 08/02/2019   Iron deficiency 08/01/2019   Pansinusitis 07/06/2019   Shortness of breath    Atrial fibrillation with tachy brady syndrome  09/22/2017   Acute bilateral low back pain without sciatica 01/28/2017   Pain in limb 07/22/2016   Neuropathy 06/09/2016   Chronic deep vein thrombosis (DVT) ([place]) 03/03/2016   Chronic back pain 10/19/2015   Goiter 04/25/2015   Cervical disc disorder with radiculopathy of cervical region 03/30/2015   Severe dizziness 09/01/2014   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   Interstitial lung disease ([place]) 04/06/2014   Abnormality of gait 01/13/2014   Sinoatrial node dysfunction ([place]) 01/09/2014   Allergic rhinitis 10/26/2013   Lymphedema 08/03/2013   Constipation 04/12/2013   Costochondritis 01/19/2013   Fatigue 09/27/2012   Normocytic anemia 04/28/2012   OSA (obstructive sleep apnea) 04/22/2012   Pulmonary hypertension, unspecified ([place]) 03/30/2012   Hyperlipidemia 03/30/2012   Hypertension     Imaging: 08/21/2021 chest ct FINDINGS: Cardiovascular: Atherosclerosis of thoracic aorta is noted without  aneurysm formation. Mild cardiomegaly is noted. No pericardial effusion is noted.   Mediastinum/Nodes: Stable enlarged thyroid gland with multiple small nodules. Stable calcified subcarinal adenopathy is noted consistent with prior granulomatous disease. Esophagus is unremarkable.   Lungs/Pleura: No pneumothorax or pleural effusion is noted.  Stable reticular opacities and bronchiectasis is seen in the upper and lower lobes bilaterally suggesting fibrosis or possible chronic pneumonitis. Interval development of 2.1 x 1.6 cm irregular density seen in the right lower lobe best seen on image 80 of series 3. This may represent focal inflammation or atelectasis, but neoplasm cannot be excluded. Evaluation is limited due to the lack of intravenous contrast.   Upper Abdomen: No acute abnormality.   Musculoskeletal: No chest wall mass or suspicious bone lesions identified.   IMPRESSION: Stable large thyroid gland with multiple nodules. Not clinically significant; no follow-up imaging recommended. (Ref: J Am Coll Radiol. 2015 Feb;12(2): 143-50).   There is again noted stable reticular opacities and bronchiectasis seen involving the upper and lower lobes bilaterally suggesting fibrosis or possibly chronic pneumonitis. There is noted interval development of 2.1 x 1.6 cm irregular nodular density in the right lower lobe; this may represent focal inflammation or atelectasis, but neoplasm or malignancy cannot be excluded. Follow-up CT scan of the chest with intravenous contrast in 2-3 weeks is recommended to ensure stability or resolution.   Aortic Atherosclerosis   Problem List Items Addressed This Visit   None Visit Diagnoses     Bacteremia    -  Primary   Relevant Orders   CT CHEST LIMITED W CONTRAST   CBC   COMPLETE METABOLIC PANEL WITH GFR   C-reactive protein   Pacemaker       Relevant Orders   CT CHEST LIMITED W CONTRAST   CBC   COMPLETE METABOLIC PANEL WITH GFR   C-reactive protein       76 yo female with GGS bacteremia and pacemaker infection s/p 6 weeks pcn by 08/08/21 transtioned to chronic cefadroxil suppression  She appears to be doing well tolerating the medication with mild diarrhea side effect  A thing I am not sure today is her description of the pacer pocket site of "raised skin/blister" but  without tenderness/redness. I asked her to send picture via mychart or if not able to see one of [place] next week physically  Otherwise she'll also need to see [place] in 3 months for follow up routinely   Continue cefadroxil 500 mg po bid   ----------- 6/5 assessment Clinically appears to be doing well although tender at the pacer site, would need to see what crp is and need chest ct. No redness/abscess on exam Tolerating cefadroxil    Labs/ct scan F/u 1 month Continue cefadroxil 500 mg twice a day    I have spent a total of 20 minutes of face-to-face and non-face-to-face time, excluding clinical staff time, preparing to see patient, ordering tests and/or medications, and provide counseling the patient  I am having Arther Dames. Toy Cookey maintain her acetaminophen, (Menthol, Topical Analgesic, (BIOFREEZE EX)), albuterol, meclizine, Difluprednate, apixaban, sildenafil, benzonatate, macitentan, Uptravi, amiodarone, cefadroxil, gabapentin, doxycycline, DULoxetine, midodrine, metolazone, torsemide, potassium chloride SA, and rosuvastatin.   Follow-up: Return in about 4 weeks (around 12/23/2021).         Jabier Mutton, [place] [facility] [place] [phone]  pager   [phone] cell 11/25/2021, 3:24 PM

## 2021-11-26 ENCOUNTER — Telehealth: Payer: Self-pay | Admitting: Cardiovascular Disease

## 2021-11-26 ENCOUNTER — Ambulatory Visit (INDEPENDENT_AMBULATORY_CARE_PROVIDER_SITE_OTHER): Payer: Medicare HMO

## 2021-11-26 ENCOUNTER — Telehealth: Payer: Self-pay

## 2021-11-26 DIAGNOSIS — I495 Sick sinus syndrome: Secondary | ICD-10-CM

## 2021-11-26 LAB — CBC
HCT: 29.5 % — ABNORMAL LOW (ref 35.0–45.0)
Hemoglobin: 9.4 g/dL — ABNORMAL LOW (ref 11.7–15.5)
MCH: 27.6 pg (ref 27.0–33.0)
MCHC: 31.9 g/dL — ABNORMAL LOW (ref 32.0–36.0)
MCV: 86.8 fL (ref 80.0–100.0)
MPV: 10.9 fL (ref 7.5–12.5)
Platelets: 185 10*3/uL (ref 140–400)
RBC: 3.4 10*6/uL — ABNORMAL LOW (ref 3.80–5.10)
RDW: 13 % (ref 11.0–15.0)
WBC: 5 10*3/uL (ref 3.8–10.8)

## 2021-11-26 LAB — COMPLETE METABOLIC PANEL WITH GFR
AG Ratio: 1.5 (calc) (ref 1.0–2.5)
ALT: 10 U/L (ref 6–29)
AST: 12 U/L (ref 10–35)
Albumin: 4 g/dL (ref 3.6–5.1)
Alkaline phosphatase (APISO): 106 U/L (ref 37–153)
BUN/Creatinine Ratio: 16 (calc) (ref 6–22)
BUN: 20 mg/dL (ref 7–25)
CO2: 29 mmol/L (ref 20–32)
Calcium: 9.2 mg/dL (ref 8.6–10.4)
Chloride: 104 mmol/L (ref 98–110)
Creat: 1.23 mg/dL — ABNORMAL HIGH (ref 0.60–1.00)
Globulin: 2.6 g/dL (calc) (ref 1.9–3.7)
Glucose, Bld: 129 mg/dL — ABNORMAL HIGH (ref 65–99)
Potassium: 3.9 mmol/L (ref 3.5–5.3)
Sodium: 142 mmol/L (ref 135–146)
Total Bilirubin: 0.4 mg/dL (ref 0.2–1.2)
Total Protein: 6.6 g/dL (ref 6.1–8.1)
eGFR: 45 mL/min/{1.73_m2} — ABNORMAL LOW (ref >=60)

## 2021-11-26 LAB — C-REACTIVE PROTEIN: CRP: 3 mg/L (ref <8.0)

## 2021-11-26 MED ORDER — CEFADROXIL 500 MG PO CAPS: 500.0000 mg | ORAL_CAPSULE | Freq: Two times a day (BID) | ORAL | 3 refills | Status: DC

## 2021-11-26 NOTE — Addendum Note (Signed)
Addended by: Prudencio Pair T on: 11/26/2021 05:04 PM   Modules accepted: Orders

## 2021-11-26 NOTE — Telephone Encounter (Signed)
Pt c/o Shortness Of Breath: STAT if SOB developed within the last 24 hours or pt is noticeably SOB on the phone  1. Are you currently SOB (can you hear that pt is SOB on the phone)? Ssomewhat, yes  2. How long have you been experiencing SOB? Since last night  3. Are you SOB when sitting or when up moving around? both  4. Are you currently experiencing any other symptoms? Weight gain, up to 189, yesterday 186 Patient states she took a breathing treatment and made it some better.   Patient has an appt with Murray Hodgkins, NP on 6/14

## 2021-11-26 NOTE — Telephone Encounter (Signed)
Received call from [place], community paramedic - states patient needs refill of antibiotics.   Beryle Flock, RN

## 2021-11-27 ENCOUNTER — Other Ambulatory Visit (HOSPITAL_COMMUNITY): Payer: Self-pay

## 2021-11-27 LAB — CUP PACEART REMOTE DEVICE CHECK
Battery Remaining Longevity: 60 mo
Battery Remaining Percentage: 62 %
Brady Statistic RA Percent Paced: 92 %
Brady Statistic RV Percent Paced: 99 %
Date Time Interrogation Session: 20230607141100
Implantable Lead Implant Date: 20150720
Implantable Lead Implant Date: 20150720
Implantable Lead Location: 753859
Implantable Lead Location: 753860
Implantable Lead Model: 5076
Implantable Lead Model: 5076
Implantable Pulse Generator Implant Date: 20150720
Lead Channel Impedance Value: 348 Ohm
Lead Channel Impedance Value: 493 Ohm
Lead Channel Pacing Threshold Amplitude: 0.7 V
Lead Channel Pacing Threshold Pulse Width: 0.4 ms
Lead Channel Setting Pacing Amplitude: 2 V
Lead Channel Setting Pacing Amplitude: 2.4 V
Lead Channel Setting Pacing Pulse Width: 0.4 ms
Lead Channel Setting Sensing Sensitivity: 2.5 mV
Pulse Gen Serial Number: 390330

## 2021-11-28 ENCOUNTER — Encounter (HOSPITAL_COMMUNITY): Payer: Self-pay

## 2021-11-28 DIAGNOSIS — Z961 Presence of intraocular lens: Secondary | ICD-10-CM | POA: Diagnosis not present

## 2021-11-28 DIAGNOSIS — H209 Unspecified iridocyclitis: Secondary | ICD-10-CM | POA: Diagnosis not present

## 2021-11-28 NOTE — Telephone Encounter (Signed)
Recent labs were okay.  So she can take a dose of metolazone today.

## 2021-11-28 NOTE — Telephone Encounter (Signed)
Spoke w/ pt.  She reports SOB, "congestion" and wt gain of 3 lbs overnight.   She does have some ankle swelling; does wear compression hose, but does not keep feet elevated. She reports limiting her fluid and sodium intake, drinking a small amount of water and her normal cup of coffee.  She had spaghetti last night, but made the sauce homemade w/ no added salt.  She saw Dr. [name] last month on 10/23/21 and as advised to take metolazone, but after labs came back, she was advised by Allena Katz, FNP to "Do not take metolazone dose today as discussed. Increase daily torsemide to 60 mg bid." She reports that [name], EMT [phone]) was at her house yesterday and advised her that she is working today and came come out and administer IV lasix if ordered. Advised her that I will make Dr. Fletcher Anon aware and call her back w/ his recommendation.

## 2021-11-28 NOTE — Telephone Encounter (Signed)
Spoke w/ pt.  Advised her of Dr. Tyrell Antonio recommendation. She is appreciative of the call and will take her metolazone right now. Asked her to call back if we can be of further assistance.

## 2021-11-28 NOTE — Progress Notes (Signed)
Had a home visit with Debra Saunders. She advised me yesterday her breathing was real short and she was up 3 lbs.  She had missed her 2nd dose of torsemide the day before.  Today breathing is better but not back to her normal.  She is down 2 lbs.  Advised her not to miss any meds and watch her fluids and high sodium foods.  To elevate her legs and rest.  She staes her dizziness is worse, she is taking a higher dose of gabapentin, she states her doctor changed it. She is taking 600 teice a day and 900 mg at night.  She was down to 300 mg twice a day and 600 mg at night.  Epic states 600 mg 3 times a day.  Will check with her PCP for correct dose.  She has all her medications, picked up 3 for her at pharmacy.  Refilled 2 weeks of boxes for her. Lungs are clear and and she is able to take a deep breath.  She is short of breath with activity more than her normal.  At rest goes away in short period of time.  She is talking in full sentences and has her oxygen on 24/7.  She has mor than her normal edema in her left leg and r leg is fine.  Abdomen she states is ok.  Denies chest pain.  Contacted Cancer center and they are setting up transportation for her visit next week and Dr [name] office is setting up transportation for her appt next week also, per Janett Billow in their office.  Will continue to visit for heart failure, diet and medication mangement.   [place] [phone]

## 2021-11-29 ENCOUNTER — Telehealth: Payer: Self-pay | Admitting: Family Medicine

## 2021-11-29 NOTE — Telephone Encounter (Signed)
-----   Message from [name], [place] sent at 11/28/2021  8:42 AM EDT ----- Good morning, Brett Fairy with community paramedic.  I visit with Debra Saunders at her home and fill her med boxes up for her and make sure she is taking her meds right.  I work with the Heart Failure clinic in [place] and here in [place].  For the past few times she tells me you changed her gabapentin, Im just wanting to know how much should she be taking.  2 weeks ago it was 300 twice a day and 600 at night.  Now she is taking 600 mg twice a day and 900 at night.  Prior to this she was taking 900 3 times a day.  When she reduced the mg during the day her dizziness did improve.  She has a lot of swelling in her left leg and pulmonology said she has a lot of fluid in her lungs.  Her weight is trending up almost to 200 lbs now.  Her breathing was short 2 days ago but she did miss her 2nd dose of torsemide that day, yesterday breathing was better and down 2 lbs.  To make this shorter, I'm just wanting to know the correct dose she should be taking of the gabapentin.  She states her dizziness is worse but she is taking more gabapentin this past week.  Thank you so much. She also has a visit with you next week in person.   [place] [phone]

## 2021-11-29 NOTE — Telephone Encounter (Signed)
The last dose that I can see she was supposed to be on was 600 mg TID. She certainly should reduce the dose to at least that if she is dizzier at higher doses. We may need to reduce the dose further if the dizziness persists at the 600 mg TID dose of gabapentin.

## 2021-12-02 ENCOUNTER — Other Ambulatory Visit: Payer: Self-pay

## 2021-12-02 ENCOUNTER — Inpatient Hospital Stay: Payer: Medicare HMO

## 2021-12-02 ENCOUNTER — Inpatient Hospital Stay: Payer: Medicare HMO | Attending: Internal Medicine

## 2021-12-02 ENCOUNTER — Encounter: Payer: Self-pay | Admitting: Internal Medicine

## 2021-12-02 ENCOUNTER — Inpatient Hospital Stay (HOSPITAL_BASED_OUTPATIENT_CLINIC_OR_DEPARTMENT_OTHER): Payer: Medicare HMO | Admitting: Internal Medicine

## 2021-12-02 VITALS — BP 149/69 | HR 59 | Resp 20

## 2021-12-02 DIAGNOSIS — E538 Deficiency of other specified B group vitamins: Secondary | ICD-10-CM | POA: Insufficient documentation

## 2021-12-02 DIAGNOSIS — E611 Iron deficiency: Secondary | ICD-10-CM

## 2021-12-02 DIAGNOSIS — Z86718 Personal history of other venous thrombosis and embolism: Secondary | ICD-10-CM | POA: Insufficient documentation

## 2021-12-02 DIAGNOSIS — Z7901 Long term (current) use of anticoagulants: Secondary | ICD-10-CM | POA: Insufficient documentation

## 2021-12-02 DIAGNOSIS — Z803 Family history of malignant neoplasm of breast: Secondary | ICD-10-CM | POA: Diagnosis not present

## 2021-12-02 DIAGNOSIS — I4891 Unspecified atrial fibrillation: Secondary | ICD-10-CM | POA: Diagnosis not present

## 2021-12-02 DIAGNOSIS — N183 Chronic kidney disease, stage 3 unspecified: Secondary | ICD-10-CM | POA: Insufficient documentation

## 2021-12-02 DIAGNOSIS — E876 Hypokalemia: Secondary | ICD-10-CM | POA: Insufficient documentation

## 2021-12-02 DIAGNOSIS — Z87891 Personal history of nicotine dependence: Secondary | ICD-10-CM | POA: Insufficient documentation

## 2021-12-02 DIAGNOSIS — R5383 Other fatigue: Secondary | ICD-10-CM | POA: Insufficient documentation

## 2021-12-02 DIAGNOSIS — D631 Anemia in chronic kidney disease: Secondary | ICD-10-CM | POA: Diagnosis not present

## 2021-12-02 LAB — BASIC METABOLIC PANEL
Anion gap: 8 (ref 5–15)
BUN: 33 mg/dL — ABNORMAL HIGH (ref 8–23)
CO2: 31 mmol/L (ref 22–32)
Calcium: 8.8 mg/dL — ABNORMAL LOW (ref 8.9–10.3)
Chloride: 101 mmol/L (ref 98–111)
Creatinine, Ser: 1.57 mg/dL — ABNORMAL HIGH (ref 0.44–1.00)
GFR, Estimated: 34 mL/min — ABNORMAL LOW (ref >60)
Glucose, Bld: 157 mg/dL — ABNORMAL HIGH (ref 70–99)
Potassium: 3.2 mmol/L — ABNORMAL LOW (ref 3.5–5.1)
Sodium: 140 mmol/L (ref 135–145)

## 2021-12-02 LAB — CBC WITH DIFFERENTIAL/PLATELET
Abs Immature Granulocytes: 0.01 10*3/uL (ref 0.00–0.07)
Basophils Absolute: 0 10*3/uL (ref 0.0–0.1)
Basophils Relative: 1 %
Eosinophils Absolute: 0.2 10*3/uL (ref 0.0–0.5)
Eosinophils Relative: 4 %
HCT: 32.5 % — ABNORMAL LOW (ref 36.0–46.0)
Hemoglobin: 9.8 g/dL — ABNORMAL LOW (ref 12.0–15.0)
Immature Granulocytes: 0 %
Lymphocytes Relative: 27 %
Lymphs Abs: 1.2 10*3/uL (ref 0.7–4.0)
MCH: 27.5 pg (ref 26.0–34.0)
MCHC: 30.2 g/dL (ref 30.0–36.0)
MCV: 91 fL (ref 80.0–100.0)
Monocytes Absolute: 0.3 10*3/uL (ref 0.1–1.0)
Monocytes Relative: 6 %
Neutro Abs: 2.9 10*3/uL (ref 1.7–7.7)
Neutrophils Relative %: 62 %
Platelets: 197 10*3/uL (ref 150–400)
RBC: 3.57 MIL/uL — ABNORMAL LOW (ref 3.87–5.11)
RDW: 13.4 % (ref 11.5–15.5)
WBC: 4.6 10*3/uL (ref 4.0–10.5)
nRBC: 0 % (ref 0.0–0.2)

## 2021-12-02 LAB — IRON AND TIBC
Iron: 59 ug/dL (ref 28–170)
Saturation Ratios: 17 % (ref 10.4–31.8)
TIBC: 357 ug/dL (ref 250–450)
UIBC: 298 ug/dL

## 2021-12-02 LAB — FERRITIN: Ferritin: 374 ng/mL — ABNORMAL HIGH (ref 11–307)

## 2021-12-02 LAB — VITAMIN B12: Vitamin B-12: 135 pg/mL — ABNORMAL LOW (ref 180–914)

## 2021-12-02 MED ORDER — SODIUM CHLORIDE 0.9 % IV SOLN
Freq: Once | INTRAVENOUS | Status: AC
  Filled 2021-12-02: qty 250

## 2021-12-02 MED ORDER — CYANOCOBALAMIN 1000 MCG/ML IJ SOLN
1000.0000 ug | Freq: Once | INTRAMUSCULAR | Status: AC
  Administered 2021-12-02: 1000 ug via INTRAMUSCULAR
  Filled 2021-12-02: qty 1

## 2021-12-02 MED ORDER — IRON SUCROSE 20 MG/ML IV SOLN
200.0000 mg | Freq: Once | INTRAVENOUS | Status: AC
  Administered 2021-12-02: 200 mg via INTRAVENOUS
  Filled 2021-12-02: qty 10

## 2021-12-02 NOTE — Assessment & Plan Note (Addendum)
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 CKD stage III  currently s/p Venofer. [name] JAN 2023 [sat 9%; ferrtin-700+- hospital/sepsis];   # today hemoglobin is 9.8 improved from 8; proceed with venofer today.   # Mild Hypokalemia- K 3.2- continue Kdur BID.   # Etiology of anemia-likely CKD stage III/ [chronic diarrhea/malabsorption].   #History of DVT/s/p IVC filter; ?  A. Fib-Eliquis-STABLE>   #Borderline low B12-230; s/p B12 injection.  Hold B12 injections in Jan 2023 within normal limits.    # DISPOSITION: # proceed with venofer and  b12 today # Follow up in 4 months-MD; labs-- cbc;bmp; irons tudies/ferritin; B12 levels-possible Venofer / B12 injection- Dr.B  Cc; Tammi Klippel

## 2021-12-02 NOTE — Progress Notes (Signed)
Monitored x 15 minutes post Venofer treatment. Patient tolerated treatment well.

## 2021-12-02 NOTE — Progress Notes (Signed)
Chronic dizziness and is being referred to neurology.

## 2021-12-02 NOTE — Progress Notes (Signed)
[place] NOTE  Patient Care Team: [name], MD as PCP - General (Family Medicine) Wellington Hampshire, MD as PCP - Cardiology (Cardiology) Deboraha Sprang, MD as PCP - Electrophysiology (Cardiology) Thom Chimes, MD as Referring Physician (Allergy) Juanito Doom, MD as Referring Physician (Internal Medicine) Cammie Sickle, MD as Consulting Physician (Hematology and Oncology) Anemia CHIEF COMPLAINTS/PURPOSE OF CONSULTATION: Anemia   HEMATOLOGY HISTORY  # CHORNIC IRON DEFICIENCY ANEMIA [BL 10-11]-Jan today 21 PCP Hb-8.8; Antonieta Pert sat-5%; ferritin 11] EGD-; colonoscopy MARCH 2020 -polyp [[facility]-hold further work-up secondary to cardiac issues]; remote-EGD-?;  February 2021-LDH/haptoglobin normal; kappa lambda light chain ratio/M panel normal;  on Venofer; November 2020-CT scan question early cirrhosis/no splenomegaly.  No acute process; MARCH 2021- STOOL OCCULT x3- NEGATIVE.   #B12 deficiency-IM every 3 to 4 months  #2014 -question blood transfusion  [patient admitted for stroke]; CHF chronic -[ 2004 on 4 lits; Dr.Arida/Klein]; CT scan chest 2021 -no adenopathy; fibrotic changes; walker; CKD stage III-   #Incidental right axillary mass-s/p US biopsy [PCP] -negative for malignancy.  HISTORY OF PRESENTING ILLNESS: Patient in wheelchair.  Alone.  Debra Saunders 76 y.o.  female anemia/likely secondary to ?CKD stage III is here for follow-up.  Patient continues to chronic fatigue.  No blood in stools [with no nausea vomiting. Patient continues with oxygen.    Review of Systems  Constitutional:  Positive for malaise/fatigue. Negative for chills, diaphoresis, fever and weight loss.  HENT:  Negative for nosebleeds and sore throat.   Eyes:  Negative for double vision.  Respiratory:  Positive for shortness of breath. Negative for cough, hemoptysis, sputum production and wheezing.   Cardiovascular:  Negative for chest pain, palpitations, orthopnea  and leg swelling.  Gastrointestinal:  Negative for abdominal pain, blood in stool, constipation, heartburn, melena, nausea and vomiting.  Genitourinary:  Negative for dysuria, frequency and urgency.  Musculoskeletal:  Negative for back pain and joint pain.  Skin: Negative.  Negative for itching and rash.  Neurological:  Negative for dizziness, tingling, focal weakness, weakness and headaches.  Endo/Heme/Allergies:  Does not bruise/bleed easily.  Psychiatric/Behavioral:  Negative for depression. The patient is not nervous/anxious and does not have insomnia.     MEDICAL HISTORY:  Past Medical History:  Diagnosis Date   Arthritis    Atrial fibrillation, persistent ([place])    a. s/p TEE-guided DCCV on 08/20/2017   Cardiorenal syndrome    a. 09/2017 Creat rose to 2.7 w/ diuresis-->HD x 2.   Chest pain    a. 01/2012 Cath: nonobs dzs;  c. 04/2014 MV: No ischemia.   Chronic back pain    Chronic combined systolic (congestive) and diastolic (congestive) heart failure ([place])    a. Dx 2005 @ [facility];  b. 02/2014 Echo: EF 55-60%; c. 07/2017: EF 30-35%, diffuse HK, trivial AI, severe MR, moderate TR; d. 09/2017 Echo: EF 40-45%; e. 02/2018 Echo: EF 55-60%, Gr1 DD; f. 08/2019 Echo: EF 60-65%, Gr2 DD, Nl RV fxn, RVSP 49.73mHg. Mod-Sev MR. Mild to mod TR. Mod PR. RA ~ 864mg.   Chronic respiratory failure (HCC)    a. on home O2   Gastritis    History of intracranial hemorrhage    a. 6/14 described by neurosurgery as "small frontal posttraumatic SAH " - pt was on xarelto @ the time.   Hyperlipidemia    Hypertension    Interstitial lung disease (HCC)    Lipoma of back    Lymphedema    a. Chronic LLE  edema.   NICM (nonischemic cardiomyopathy) ([place])    a. 09/2017 Echo: EF 40-45%; b. 09/2017 Cath: minor, insignificant CAD; c. 02/2018 Echo: EF 55-60%, Gr1 DD; d. 08/2019 Echo: EF 60-65%, Gr2 DD.   Obesity    PAH (pulmonary artery hypertension) ([place])    a. 09/2017 Echo: PASP 28mHg; b. 09/2017 RHC: RA 23, RV 84/13, PA  84/29, PCWP 20, CO 3.89, CI 1.89. LVEDP 18.   Presence of permanent cardiac pacemaker 01/09/2014   Sepsis with metabolic encephalopathy ([place]    Sleep apnea    Streptococcal bacteremia    Stroke [facility] 2014   Subarachnoid hemorrhage ([place] 01/19/2013   Symptomatic bradycardia    a. s/p BSX PPM.   Thyroid nodule    Type II diabetes mellitus ([place]    Urinary incontinence     SURGICAL HISTORY: Past Surgical History:  Procedure Laterality Date   ABDOMINAL HYSTERECTOMY  1969   BREAST BIOPSY Right 10/28/2019   lymph node bx, [place], pending path    CARDIAC CATHETERIZATION  2013   [place] obstructive CAD: Only 20% ostial left circumflex.   CARDIOVERSION N/A 10/12/2017   Procedure: CARDIOVERSION;  Surgeon: GMinna Merritts MD;  [facility];  Service: Cardiovascular;  Laterality: N/A;   CARDIOVERSION N/A 07/04/2021   Procedure: CARDIOVERSION;  Surgeon: MLarey Dresser MD;  [facility];  Service: Cardiovascular;  Laterality: N/A;   CATARACT EXTRACTION W/PHACO Right 09/19/2020   Procedure: CATARACT EXTRACTION PHACO AND INTRAOCULAR LENS PLACEMENT (IOC) RIGHT DIABETIC 3.95 00:43.0 9.2%;  Surgeon: BLeandrew Koyanagi MD;  [place]  Service: Ophthalmology;  Laterality: Right;  Pt requests to be last sleep apnea   CATARACT EXTRACTION W/PHACO Left 10/03/2020   Procedure: CATARACT EXTRACTION PHACO AND INTRAOCULAR LENS PLACEMENT ([place] LEFT DIABETIC;  Surgeon: BLeandrew Koyanagi MD;  [place] [facility]  Service: Ophthalmology;  Laterality: Left;  3.25 0:59.3 5.5%   CHOLECYSTECTOMY     COLONOSCOPY WITH PROPOFOL N/A 09/10/2018   Procedure: COLONOSCOPY WITH PROPOFOL;  Surgeon: EManya Silvas MD;  [facility];  Service: Endoscopy;  Laterality: N/A;   CYST REMOVAL TRUNK  2002   BACK   DIALYSIS/PERMA CATHETER INSERTION N/A 10/06/2017   Procedure: DIALYSIS/PERMA CATHETER INSERTION;  Surgeon: SKatha Cabal MD;  [facility];  Service: Cardiovascular;  Laterality: N/A;   INSERT / REPLACE / REMOVE PACEMAKER     lipoma removal  2014   back   PACEMAKER INSERTION  2015   [employer] dual chamber pacemaker implanted by Dr KCaryl Comesfor symptomatic bradycardia   PERMANENT PACEMAKER INSERTION N/A 01/09/2014   Procedure: PERMANENT PACEMAKER INSERTION;  Surgeon: SDeboraha Sprang MD;  [facility];  Service: Cardiovascular;  Laterality: N/A;   RIGHT HEART CATH N/A 04/19/2018   Procedure: RIGHT HEART CATH;  Surgeon: MLarey Dresser MD;  [place];  Service: Cardiovascular;  Laterality: N/A;   RIGHT HEART CATH N/A 08/30/2020   Procedure: RIGHT HEART CATH;  Surgeon: MLarey Dresser MD;  [facility];  Service: Cardiovascular;  Laterality: N/A;   RIGHT/LEFT HEART CATH AND CORONARY ANGIOGRAPHY N/A 10/19/2017   Procedure: RIGHT/LEFT HEART CATH AND CORONARY ANGIOGRAPHY;  Surgeon: AWellington Hampshire MD;  [facility];  Service: Cardiovascular;  Laterality: N/A;   TEE WITHOUT CARDIOVERSION N/A 08/20/2017   Procedure: TRANSESOPHAGEAL ECHOCARDIOGRAM (TEE) & Direct current cardioversion;  Surgeon: GMinna Merritts MD;  [facility];  Service:  Cardiovascular;  Laterality: N/A;   TEE WITHOUT CARDIOVERSION N/A 06/28/2021   Procedure: TRANSESOPHAGEAL ECHOCARDIOGRAM (TEE);  Surgeon: Minna Merritts, MD;  Location: [facility];  Service: Cardiovascular;  Laterality: N/A;   TEE WITHOUT CARDIOVERSION N/A 07/04/2021   Procedure: TRANSESOPHAGEAL ECHOCARDIOGRAM (TEE);  Surgeon: Larey Dresser, MD;  [facility];  Service: Cardiovascular;  Laterality: N/A;   TRIGGER FINGER RELEASE      SOCIAL HISTORY: Social History   Socioeconomic History   Marital status: Divorced    Spouse name: Not on file   Number of children: 4   Years of education: 12   Highest education level: High school graduate  Occupational History   Occupation: Retired- factory  work    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 Has 4 children. No pets.      Work - Restaurant manager, fast food, retired      Diet - regular      02/14/2019: reports has Meals On Wheels delivered to her home;       Remote hx of smoking > 30 years; No alcohol-    Social Determinants of Health   Financial Resource Strain: Medium Risk (07/04/2021)   Overall Financial Resource Strain (CARDIA)    Difficulty of Paying Living Expenses: Somewhat hard  Food Insecurity: No Food Insecurity (07/04/2021)   Hunger Vital Sign    Worried About Running Out of Food in the Last Year: Never true    Ran Out of Food in the Last Year: Never true  Transportation Needs: No Transportation Needs (07/04/2021)   PRAPARE - Hydrologist (Medical): No    Lack of Transportation (Non-Medical): No  Physical Activity: Inactive (01/05/2018)   Exercise Vital Sign    Days of Exercise per Week: 0 days    Minutes of Exercise per Session: 0 min  Stress: No Stress Concern Present (12/17/2020)   [place]    Feeling of Stress : Only a little  Social Connections: Socially Isolated (12/17/2020)   Social Connection and Isolation Panel [NHANES]    Frequency of Communication with Friends and Family: More than three times a week    Frequency of Social Gatherings with Friends and Family: Once a week    Attends Religious Services: Never    Marine scientist or Organizations: No    Attends Archivist Meetings:  Never    Marital Status: Divorced  Human resources officer Violence: Not At Risk (12/17/2020)   Humiliation, Afraid, Rape, and Kick questionnaire    Fear of Current or Ex-Partner: No    Emotionally Abused: No    Physically Abused: No    Sexually Abused: No    FAMILY HISTORY: Family History  Problem Relation Age of Onset   Heart disease Mother    Hypertension Mother    COPD Mother        was a smoker  Thyroid disease Mother    Hypertension Father    Hypertension Sister    Thyroid disease Sister    Breast cancer Sister    Hypertension Sister    Thyroid disease Sister    Bone cancer Sister    Breast cancer Maternal Aunt    Kidney disease Neg Hx    Bladder Cancer Neg Hx     ALLERGIES:  is allergic to aspirin and other.  MEDICATIONS:  Current Outpatient Medications  Medication Sig Dispense Refill   acetaminophen (TYLENOL) 325 MG tablet Take 325-650 mg by mouth every 6 (six) hours as needed for mild pain.     albuterol (ACCUNEB) 0.63 MG/3ML nebulizer solution Take 1 ampule by nebulization every 6 (six) hours as needed for wheezing.     amiodarone (PACERONE) 200 MG tablet Take 1 tablet (200 mg total) by mouth daily. 30 tablet 6   apixaban (ELIQUIS) 5 MG TABS tablet Take 5 mg by mouth 2 (two) times daily.     cefadroxil (DURICEF) 500 MG capsule Take 1 capsule (500 mg total) by mouth 2 (two) times daily. 180 capsule 3   Difluprednate 0.05 % EMUL Place 1 drop into both eyes 2 (two) times daily.     DULoxetine (CYMBALTA) 30 MG capsule Take 60 mg by mouth daily.     gabapentin (NEURONTIN) 300 MG capsule Take 2 capsules (600 mg total) by mouth 3 (three) times daily. (Patient taking differently: Take 300 mg by mouth 3 (three) times daily. Taking 600 mg in am, noon and 900 mg at night) 540 capsule 1   macitentan (OPSUMIT) 10 MG tablet Take 1 tablet (10 mg total) by mouth daily. 90 tablet 3   meclizine (ANTIVERT) 25 MG tablet Take 1 tablet (25 mg total) by mouth 3 (three) times daily as needed  for dizziness. 30 tablet 1   Menthol, Topical Analgesic, (BIOFREEZE EX) Apply 1 application topically 4 (four) times daily as needed (for pain).     midodrine (PROAMATINE) 10 MG tablet Take 1.5 tablets (15 mg total) by mouth 3 (three) times daily with meals. 135 tablet 3   potassium chloride SA (KLOR-CON M20) 20 MEQ tablet Take 2 tablets (40 mEq total) by mouth daily. 180 tablet 3   rosuvastatin (CRESTOR) 10 MG tablet Take 1 tablet (10 mg total) by mouth daily. 90 tablet 3   Selexipag (UPTRAVI) 1600 MCG TABS Take 1 tablet (1,600 mcg total) by mouth 2 (two) times daily. 60 tablet 11   sildenafil (REVATIO) 20 MG tablet Take 20 mg by mouth 3 (three) times daily.     torsemide (DEMADEX) 20 MG tablet Take 3 tablets (60 mg total) by mouth daily. 120 tablet 5   benzonatate (TESSALON) 200 MG capsule Take 1 capsule (200 mg total) by mouth 3 (three) times daily as needed for cough. 20 capsule 0   doxycycline (VIBRA-TABS) 100 MG tablet Take 1 tablet (100 mg total) by mouth 2 (two) times daily. (Patient not taking: Reported on 11/28/2021) 14 tablet 0   metolazone (ZAROXOLYN) 2.5 MG tablet Take 1 tablet by mouth as directed by the HF Clinic 1 tablet 0   No current facility-administered medications for this visit.      PHYSICAL EXAMINATION:   Vitals:   12/02/21 1300  BP: (!) 101/54  Pulse: (!) 50  Resp: 18  Temp: 98.6 F (37 C)   Filed Weights   12/02/21 1300  Weight: 189 lb 9.6 oz (86 kg)    Physical Exam Constitutional:  Comments: Patient in wheelchair.  Alone.  She is on O2 nasal cannula.  HENT:     Head: Normocephalic and atraumatic.     Mouth/Throat:     Pharynx: No oropharyngeal exudate.  Eyes:     Pupils: Pupils are equal, round, and reactive to light.  Cardiovascular:     Rate and Rhythm: Normal rate and regular rhythm.  Pulmonary:     Effort: No respiratory distress.     Breath sounds: No wheezing.     Comments: Decreased air entry bilaterally.  No wheeze or  crackles. Abdominal:     General: Bowel sounds are normal. There is no distension.     Palpations: Abdomen is soft. There is no mass.     Tenderness: There is no abdominal tenderness. There is no guarding or rebound.  Musculoskeletal:        General: No tenderness. Normal range of motion.     Cervical back: Normal range of motion and neck supple.  Skin:    General: Skin is warm.  Neurological:     Mental Status: She is alert and oriented to person, place, and time.  Psychiatric:        Mood and Affect: Affect normal.     LABORATORY DATA:  I have reviewed the data as listed Lab Results  Component Value Date   WBC 4.6 12/02/2021   HGB 9.8 (L) 12/02/2021   HCT 32.5 (L) 12/02/2021   MCV 91.0 12/02/2021   PLT 197 12/02/2021   Recent Labs    06/25/21 1350 06/25/21 2243 06/28/21 0500 06/29/21 1254 06/29/21 1608 06/30/21 0522 07/01/21 0011 08/29/21 1037 09/26/21 1306 10/07/21 1735 10/23/21 1248 10/31/21 1508 11/25/21 0346 12/02/21 1254  NA 126*   < > 134*   < >  --  138   < > 139 140   < > 137 140 142 140  K 4.9   < > 3.8   < >  --  3.9   < > 4.7 2.9*   < > 6.0* 3.3* 3.9 3.2*  CL 98   < > 100   < >  --  94*   < > 99 101   < > 103 101 104 101  CO2 20*   < > 30   < >  --  34*   < > 27 31   < > '25 30 29 31  '$ GLUCOSE 171*   < > 242*   < >  --  175*   < > 113* 110*   < > 162* 111* 129* 157*  BUN 74*   < > 76*   < >  --  19   < > 27 14   < > 43* 37* 20 33*  CREATININE 3.11*   < > 1.46*   < >  --  0.80   < > 1.07* 1.19*   < > 1.95* 1.26* 1.23* 1.57*  CALCIUM 7.9*   < > 7.5*   < >  --  8.1*   < > 9.3 8.9   < > 9.4 9.4 9.2 8.8*  GFRNONAA 15*   < > 37*   < >  --  >60   < >  --  47*   < > 26* 44*  --  34*  PROT 6.2*   < > 6.3*   < > 6.8 5.8*  --  7.2 6.9  --   --   --  6.6  --   ALBUMIN  2.2*   < > 2.4*   < > 2.3* 2.0*  --  3.8 3.3*  --   --   --   --   --   AST 21   < > 13*   < > 13* 11*  --  19 13*  --   --   --  12  --   ALT 13   < > 12   < > 12 9  --  22 9  --   --   --  10  --    ALKPHOS 126   < > 123   < > 122 99  --  266* 93  --   --   --   --   --   BILITOT 3.0*   < > 1.0   < > 0.9 0.9  --  0.2 0.6  --   --   --  0.4  --   BILIDIR 1.7*  --  0.3*  --  0.3*  --   --   --   --   --   --   --   --   --   IBILI 1.3*  --  0.7  --  0.6  --   --   --   --   --   --   --   --   --    < > = values in this interval not displayed.     CUP PACEART REMOTE DEVICE CHECK  Result Date: 11/27/2021 Scheduled remote reviewed. Normal device function.  Next remote in 91 days. Kathy Breach, RN, CCDS, [employer]    Iron deficiency # Chronic Anemia [BL10-11]-iron deficiency/? CKD stage III  currently s/p Venofer. [name] JAN 2023 [sat 9%; ferrtin-700+- hospital/sepsis];   # today hemoglobin is 9.8 improved from 8; proceed with venofer  # Mild Hypokalemia- K 3.2- continue Kdur BID.   # Etiology of anemia-likely CKD stage III/ [chronic diarrhea/malabsorption].   #History of DVT/s/p IVC filter; ?  A. Fib-Eliquis-STABLE>   #Borderline low B12-230; s/p B12 injection.  Hold B12 injections in Jan 2023 within normal limits.    # DISPOSITION: # proceed with venofer and  b12 today # Follow up in 4 months-MD; labs-- cbc;bmp; irons tudies/ferritin; B12 levels-possible Venofer / B12 injection- Dr.B  Cc; Tammi Klippel  All questions were answered. The patient knows to call the clinic with any problems, questions or concerns.    Cammie Sickle, MD 12/02/2021 1:47 PM

## 2021-12-03 ENCOUNTER — Ambulatory Visit: Payer: Medicare HMO | Admitting: Family Medicine

## 2021-12-04 ENCOUNTER — Encounter: Payer: Self-pay | Admitting: Nurse Practitioner

## 2021-12-04 ENCOUNTER — Ambulatory Visit: Payer: Medicare HMO | Admitting: Nurse Practitioner

## 2021-12-04 NOTE — Progress Notes (Deleted)
Cardiology Office Note:    Date:  12/04/2021   ID:  Debra Saunders, DOB 11-Apr-1946, MRN [id number]  PCP:  [name], MD  [facility] Cardiologist:  [name], MD  [facility] Electrophysiologist:  Virl Axe, MD   Referring MD: [name], MD   Chief Complaint: ***  History of Present Illness:    Debra Saunders is a 76 y.o. female with a hx of chronic diastolic heart failure, nonobstructive CAD, atrial fibrillation on eliquis, bradycardia s/p PPM [facility]), chronic respiratory failure on home O2, COPD, PAH, DM2, HTN, CKD (required HD in past), DVT s/p IVC filter 2004, HL, traumatic subarachnoid hemorrhage while on xarelto 2014, CVA 2014, OSA, and chronic lymphedema.  Admitted 10/24-10/29/19 with acute on chronic diastolic HF. HF team consulted with concerns for low output. PICC was placed and milrinone to support RV function was started along with IV Lasix to facilitate diuresis. RHC done to evaluate PAH (see below). V/Q scan negative. High res CT showed pulmonary fibrosis. Pulmonary consult and recommended stopping amiodarone. She was transitioned to torsemide 60 mg BID. DC weight: 226 lbs.    She has started on selexipag for pulmonary hypertension in addition to Opsumit and Revatio.       Echo in 3/21 showed EF 60-65%, normal RV, PASP 50 mmHg, moderate-severe MR.    Had [place] 08/30/2020- low/normal filling pressures, mild pumonary htn, and preserved cardiac. output.    Admitted to [facility] 1/23 with endocarditis and PNA, requiring pressors. BCx positive for Streptococcus group G.  All her PAH meds were held. TEE showed a large vegetation on the pacer wire in the right atrium above the tricuspid valve (1 x 1.5 cm).  No vegetations noted on the valves.  EF was 55% and there was note of mild to moderate mitral regurgitation. RV ok. She was transferred to [facility] for pacemaker extraction. EP felt she was not a candidate due to multiple comorbidities. ID consulted  and planned for 6 weeks of IV abx. She developed Afib with RVR and started on amio. She underwent successful TEE-guided DCCV 07/14/21. Hospitalization complicated by anemia, hypotension, and AKI.  Midodrine started during admission. She was discharged to SNF, weight 198 lbs.   Admitted 3/23 with lightheadedness. Treated for sinus infection, HF meds held due to othostasis. Midodrine increased to 10 mg tid. HH PT arranged.  Repeat echo in 3/23 showed EF 60-65%, mild LVH, normal RV, IVC normal, no definite vegetation noted.   Seen in the advanced heart failure clinic 09/26/21 and she was home-bound. On 4LO2. She uses midodrine for orthostatic symptoms. She was volume overloaded and torsemde was increased to '40mg'$  BID. Midodrine was increased to '15mg'$  TID. Amiodarone was stopped for ILD. Concern for sarcoidosis.   Today,   Past Medical History:  Diagnosis Date   Arthritis    Atrial fibrillation, persistent ([place])    a. s/p TEE-guided DCCV on 08/20/2017   Cardiorenal syndrome    a. 09/2017 Creat rose to 2.7 w/ diuresis-->HD x 2.   Chest pain    a. 01/2012 Cath: nonobs dzs;  c. 04/2014 MV: No ischemia.   Chronic back pain    Chronic combined systolic (congestive) and diastolic (congestive) heart failure ([place])    a. Dx 2005 @ [facility];  b. 02/2014 Echo: EF 55-60%; c. 07/2017: EF 30-35%; d. 09/2017 Echo: EF 40-45%; e. 02/2018 Echo: EF 55-60%; f. 08/2019 Echo: EF 60-65%; g. 08/2020 RHC: RA 3, RV 30/1, PA 33/11 (20), PCWP 6; f.  08/2021 Echo: EF 60-65%, no rwma, GrIDD, nl RV fxn, RVSP 40.57mHg, mod dil LA, mod TR.   Chronic respiratory failure (HCC)    a. on home O2   Gastritis    History of intracranial hemorrhage    a. 6/14 described by neurosurgery as "small frontal posttraumatic SAH " - pt was on xarelto @ the time.   Hyperlipidemia    Hypertension    Interstitial lung disease (HCC)    Lipoma of back    Lymphedema    a. Chronic LLE edema.   NICM (nonischemic cardiomyopathy) (HMartin    a. 09/2017 Echo: EF 40-45%;  b. 09/2017 Cath: minor, insignificant CAD; c. 02/2018 Echo: EF 55-60%, Gr1 DD; d. 08/2019 Echo: EF 60-65%, Gr2 DD.   Obesity    PAH (pulmonary artery hypertension) ([place]    a. 09/2017 Echo: PASP 682mg; b. 09/2017 RHC: RA 23, RV 84/13, PA 84/29, PCWP 20, CO 3.89, CI 1.89. LVEDP 18.   Presence of permanent cardiac pacemaker 01/09/2014   Sepsis with metabolic encephalopathy (HCKauf[place]   Sleep apnea    Streptococcal bacteremia    Stroke (HE [facility]2014   Subarachnoid hemorrhage (HCLake Quivira07/30/2014   Symptomatic bradycardia    a. s/p BSX PPM.   Thyroid nodule    Type II diabetes mellitus (HCMaunabo   Urinary incontinence     Past Surgical History:  Procedure Laterality Date   ABDOMINAL HYSTERECTOMY  1969   BREAST BIOPSY Right 10/28/2019   lymph node bx, UsKoreax, pending path    CARDIAC CATHETERIZATION  2013   @ [place] obstructive CAD: Only 20% ostial left circumflex.   CARDIOVERSION N/A 10/12/2017   Procedure: CARDIOVERSION;  Surgeon: GoMinna MerrittsMD;  [place] [place];  Service: Cardiovascular;  Laterality: N/A;   CARDIOVERSION N/A 07/04/2021   Procedure: CARDIOVERSION;  Surgeon: McLarey DresserMD;  [facility];  Service: Cardiovascular;  Laterality: N/A;   CATARACT EXTRACTION W/PHACO Right 09/19/2020   Procedure: CATARACT EXTRACTION PHACO AND INTRAOCULAR LENS PLACEMENT (IOC) RIGHT DIABETIC 3.95 00:43.0 9.2%;  Surgeon: BrLeandrew KoyanagiMD;  Location: [place] Service: Ophthalmology;  Laterality: Right;  Pt requests to be last sleep apnea   CATARACT EXTRACTION W/PHACO Left 10/03/2020   Procedure: CATARACT EXTRACTION PHACO AND INTRAOCULAR LENS PLACEMENT (IOWest MonroeLEFT DIABETIC;  Surgeon: BrLeandrew KoyanagiMD;  Location: [place] Service: Ophthalmology;  Laterality: Left;  3.25 0:59.3 5.5%   CHOLECYSTECTOMY     COLONOSCOPY WITH PROPOFOL N/A 09/10/2018   Procedure: COLONOSCOPY WITH PROPOFOL;  Surgeon: ElManya SilvasMD;  [facility];  Service:  Endoscopy;  Laterality: N/A;   CYST REMOVAL TRUNK  2002   BACK   DIALYSIS/PERMA CATHETER INSERTION N/A 10/06/2017   Procedure: DIALYSIS/PERMA CATHETER INSERTION;  Surgeon: ScKatha CabalMD;  Location: [place] LAB;  Service: Cardiovascular;  Laterality: N/A;   INSERT / REPLACE / REMOVE PACEMAKER     lipoma removal  2014   back   PACEMAKER INSERTION  2015   [employer] dual chamber pacemaker implanted by Dr KlCaryl Comesor symptomatic bradycardia   PERMANENT PACEMAKER INSERTION N/A 01/09/2014   Procedure: PERMANENT PACEMAKER INSERTION;  Surgeon: StDeboraha SprangMD;  [facility];  Service: Cardiovascular;  Laterality: N/A;   RIGHT HEART CATH N/A 04/19/2018   Procedure: RIGHT HEART CATH;  Surgeon: McLarey DresserMD;  Location: MCS[place]V LAB;  Service: Cardiovascular;  Laterality: N/A;   RIGHT HEART CATH N/A 08/30/2020  Procedure: RIGHT HEART CATH;  Surgeon: Larey Dresser, MD;  Location: [place] CV LAB;  Service: Cardiovascular;  Laterality: N/A;   RIGHT/LEFT HEART CATH AND CORONARY ANGIOGRAPHY N/A 10/19/2017   Procedure: RIGHT/LEFT HEART CATH AND CORONARY ANGIOGRAPHY;  Surgeon: Wellington Hampshire, MD;  Location: [place] CV LAB;  Service: Cardiovascular;  Laterality: N/A;   TEE WITHOUT CARDIOVERSION N/A 08/20/2017   Procedure: TRANSESOPHAGEAL ECHOCARDIOGRAM (TEE) & Direct current cardioversion;  Surgeon: Minna Merritts, MD;  [facility];  Service: Cardiovascular;  Laterality: N/A;   TEE WITHOUT CARDIOVERSION N/A 06/28/2021   Procedure: TRANSESOPHAGEAL ECHOCARDIOGRAM (TEE);  Surgeon: Minna Merritts, MD;  [facility];  Service: Cardiovascular;  Laterality: N/A;   TEE WITHOUT CARDIOVERSION N/A 07/04/2021   Procedure: TRANSESOPHAGEAL ECHOCARDIOGRAM (TEE);  Surgeon: Larey Dresser, MD;  Location[facility] ENDOSCOPY;  Service: Cardiovascular;  Laterality: N/A;   TRIGGER FINGER RELEASE      Current Medications: No outpatient medications have been  marked as taking for the 12/05/21 encounter (Office Visit) with Kathlen Mody, Sheamus Hasting H, PA-C.     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 Has 4 children. No pets.      Work - Restaurant manager, fast food, retired      Diet - regular      02/14/2019: reports has Meals On Wheels delivered to her home;       Remote hx of smoking > 30 years; No alcohol-    Social Determinants of Health   Financial Resource Strain: Medium Risk (07/04/2021)   Overall Financial Resource Strain (CARDIA)    Difficulty of Paying Living Expenses: Somewhat hard  Food Insecurity: No Food Insecurity (07/04/2021)   Hunger Vital Sign    Worried About Running Out of Food in the Last Year: Never true    Ran Out of Food in the Last Year: Never true  Transportation Needs: Unmet Transportation Needs (12/04/2021)   PRAPARE - Transportation    Lack of Transportation (Medical): Yes    Lack of Transportation (Non-Medical): Yes  Physical Activity: Inactive (01/05/2018)   Exercise Vital Sign    Days of Exercise per Week: 0 days    Minutes of Exercise per Session: 0 min  Stress: No Stress Concern Present (12/17/2020)   [place]    Feeling of Stress : Only a little   Social Connections: Socially Isolated (12/17/2020)   Social Connection and Isolation Panel [NHANES]    Frequency of Communication with Friends and Family: More than three times a week    Frequency of Social Gatherings with Friends and  Family: Once a week    Attends Religious Services: Never    Marine scientist or Organizations: No    Attends Music therapist: Never    Marital Status: Divorced     Family History: The patient's ***family history includes Bone cancer in her sister; Breast cancer in her maternal aunt and sister; COPD in her mother; Heart disease in her mother; Hypertension in her father, mother, sister, and sister; Thyroid disease in her mother, sister, and sister. There is no history of Kidney disease or Bladder Cancer.  ROS:   Please see the history of present illness.    *** All other systems reviewed and are negative.  EKGs/Labs/Other Studies Reviewed:    The following studies were reviewed today: ***  EKG:  EKG is *** ordered today.  The ekg ordered today demonstrates ***  Recent Labs: 08/27/2021: Magnesium 2.5 09/26/2021: TSH 0.463 10/23/2021: B Natriuretic Peptide 66.8 11/25/2021: ALT 10 12/02/2021: BUN 33; Creatinine, Ser 1.57; Hemoglobin 9.8; Platelets 197; Potassium 3.2; Sodium 140  Recent Lipid Panel    Component Value Date/Time   CHOL 89 06/26/2021 0847   CHOL 137 02/03/2013 0441   TRIG 132 06/26/2021 0847   TRIG 197 02/03/2013 0441   HDL <10 (L) 06/26/2021 0847   HDL 25 (L) 02/03/2013 0441   CHOLHDL NOT CALCULATED 06/26/2021 0847   VLDL 26 06/26/2021 0847   VLDL 39 02/03/2013 0441   LDLCALC NOT CALCULATED 06/26/2021 0847   LDLCALC 73 02/03/2013 0441     Risk Assessment/Calculations:   {Does this patient have ATRIAL FIBRILLATION?:[phone]}   Physical Exam:    VS:  There were no vitals taken for this visit.    Wt Readings from Last 3 Encounters:  12/02/21 189 lb 9.6 oz (86 kg)  11/28/21 193 lb (87.5 kg)  11/13/21 195 lb  (88.5 kg)     GEN: *** Well nourished, well developed in no acute distress HEENT: Normal NECK: No JVD; No carotid bruits LYMPHATICS: No lymphadenopathy CARDIAC: ***RRR, no murmurs, rubs, gallops RESPIRATORY:  Clear to auscultation without rales, wheezing or rhonchi  ABDOMEN: Soft, non-tender, non-distended MUSCULOSKELETAL:  No edema; No deformity  SKIN: Warm and dry NEUROLOGIC:  Alert and oriented x 3 PSYCHIATRIC:  Normal affect   ASSESSMENT:    No diagnosis found. PLAN:    In order of problems listed above:  Afib  Chronic diastolic CHF   Pulmonary HTN RV failure  Endocarditis  Chronic hypoxic respiratory failure  HTN  H/o DVT  Orthostatic hypotension  Disposition: Follow up {follow up:15908} with ***   Shared Decision Making/Informed Consent   {Are you ordering a CV Procedure (e.g. stress test, cath, DCCV, TEE, etc)?   Press F2        :[id number]}    Signed, Shetara Launer Arlyss Repress  12/04/2021 3:49 PM    [facility]

## 2021-12-04 NOTE — Progress Notes (Deleted)
Office Visit    Patient Name: Debra Saunders Date of Encounter: 12/04/2021  Primary Care Provider:  [name], MD Primary Cardiologist:  [name], MD  Chief Complaint    ***  Past Medical History    Past Medical History:  Diagnosis Date   Arthritis    Atrial fibrillation, persistent ([place])    a. s/p TEE-guided DCCV on 08/20/2017   Cardiorenal syndrome    a. 09/2017 Creat rose to 2.7 w/ diuresis-->HD x 2.   Chest pain    a. 01/2012 Cath: nonobs dzs;  c. 04/2014 MV: No ischemia.   Chronic back pain    Chronic combined systolic (congestive) and diastolic (congestive) heart failure ([place])    a. Dx 2005 @ [facility];  b. 02/2014 Echo: EF 55-60%; c. 07/2017: EF 30-35%; d. 09/2017 Echo: EF 40-45%; e. 02/2018 Echo: EF 55-60%; f. 08/2019 Echo: EF 60-65%; g. 08/2020 RHC: RA 3, RV 30/1, PA 33/11 (20), PCWP 6; f. 08/2021 Echo: EF 60-65%, no rwma, GrIDD, nl RV fxn, RVSP 40.19mHg, mod dil LA, mod TR.   Chronic respiratory failure (HCC)    a. on home O2   Gastritis    History of intracranial hemorrhage    a. 6/14 described by neurosurgery as "small frontal posttraumatic SAH " - pt was on xarelto @ the time.   Hyperlipidemia    Hypertension    Interstitial lung disease (HCC)    Lipoma of back    Lymphedema    a. Chronic LLE edema.   NICM (nonischemic cardiomyopathy) (HLansing    a. 09/2017 Echo: EF 40-45%; b. 09/2017 Cath: minor, insignificant CAD; c. 02/2018 Echo: EF 55-60%, Gr1 DD; d. 08/2019 Echo: EF 60-65%, Gr2 DD.   Obesity    PAH (pulmonary artery hypertension) (HBylas    a. 09/2017 Echo: PASP 641mg; b. 09/2017 RHC: RA 23, RV 84/13, PA 84/29, PCWP 20, CO 3.89, CI 1.89. LVEDP 18.   Presence of permanent cardiac pacemaker 01/09/2014   Sepsis with metabolic encephalopathy (HCBull Run Mountain [place]   Sleep apnea    Streptococcal bacteremia    [facility]   Subarachnoid hemorrhage (HCTuolumne07/30/2014   Symptomatic bradycardia    a. s/p BSX PPM.   Thyroid nodule    Type II diabetes mellitus (HCNew Eagle    Urinary incontinence    Past Surgical History:  Procedure Laterality Date   ABDOMINAL HYSTERECTOMY  1969   BREAST BIOPSY Right 10/28/2019   lymph node bx, UsKoreax, pending path    CARDIAC CATHETERIZATION  2013   @ [place] obstructive CAD: Only 20% ostial left circumflex.   CARDIOVERSION N/A 10/12/2017   Procedure: CARDIOVERSION;  Surgeon: GoMinna MerrittsMD;  [place];  Service: Cardiovascular;  Laterality: N/A;   CARDIOVERSION N/A 07/04/2021   Procedure: CARDIOVERSION;  Surgeon: McLarey DresserMD;  [facility];  Service: Cardiovascular;  Laterality: N/A;   CATARACT EXTRACTION W/PHACO Right 09/19/2020   Procedure: CATARACT EXTRACTION PHACO AND INTRAOCULAR LENS PLACEMENT (IOC) RIGHT DIABETIC 3.95 00:43.0 9.2%;  Surgeon: BrLeandrew KoyanagiMD;  Location: [place] Service: Ophthalmology;  Laterality: Right;  Pt requests to be last sleep apnea   CATARACT EXTRACTION W/PHACO Left 10/03/2020   Procedure: CATARACT EXTRACTION PHACO AND INTRAOCULAR LENS PLACEMENT (IOCokedaleLEFT DIABETIC;  Surgeon: BrLeandrew KoyanagiMD;  Location: [facility] Service: Ophthalmology;  Laterality: Left;  3.25 0:59.3 5.5%   CHOLECYSTECTOMY     COLONOSCOPY WITH PROPOFOL N/A 09/10/2018   Procedure:  COLONOSCOPY WITH PROPOFOL;  Surgeon: Manya Silvas, MD;  Location: [facility] ENDOSCOPY;  Service: Endoscopy;  Laterality: N/A;   CYST REMOVAL TRUNK  2002   BACK   DIALYSIS/PERMA CATHETER INSERTION N/A 10/06/2017   Procedure: DIALYSIS/PERMA CATHETER INSERTION;  Surgeon: Katha Cabal, MD;  Location: [place] CV LAB;  Service: Cardiovascular;  Laterality: N/A;   INSERT / REPLACE / REMOVE PACEMAKER     lipoma removal  2014   back   PACEMAKER INSERTION  2015   [employer] dual chamber pacemaker implanted by Dr Caryl Comes for symptomatic bradycardia   PERMANENT PACEMAKER INSERTION N/A 01/09/2014   Procedure: PERMANENT PACEMAKER INSERTION;  Surgeon: Deboraha Sprang, MD;   Location: [facility] CATH LAB;  Service: Cardiovascular;  Laterality: N/A;   RIGHT HEART CATH N/A 04/19/2018   Procedure: RIGHT HEART CATH;  Surgeon: Larey Dresser, MD;  Location: [place] CV LAB;  Service: Cardiovascular;  Laterality: N/A;   RIGHT HEART CATH N/A 08/30/2020   Procedure: RIGHT HEART CATH;  Surgeon: Larey Dresser, MD;  Location: [place] CV LAB;  Service: Cardiovascular;  Laterality: N/A;   RIGHT/LEFT HEART CATH AND CORONARY ANGIOGRAPHY N/A 10/19/2017   Procedure: RIGHT/LEFT HEART CATH AND CORONARY ANGIOGRAPHY;  Surgeon: Wellington Hampshire, MD;  Location: [place] CV LAB;  Service: Cardiovascular;  Laterality: N/A;   TEE WITHOUT CARDIOVERSION N/A 08/20/2017   Procedure: TRANSESOPHAGEAL ECHOCARDIOGRAM (TEE) & Direct current cardioversion;  Surgeon: Minna Merritts, MD;  [facility];  Service: Cardiovascular;  Laterality: N/A;   TEE WITHOUT CARDIOVERSION N/A 06/28/2021   Procedure: TRANSESOPHAGEAL ECHOCARDIOGRAM (TEE);  Surgeon: Minna Merritts, MD;  [facility];  Service: Cardiovascular;  Laterality: N/A;   TEE WITHOUT CARDIOVERSION N/A 07/04/2021   Procedure: TRANSESOPHAGEAL ECHOCARDIOGRAM (TEE);  Surgeon: Larey Dresser, MD;  Location: [facility] ENDOSCOPY;  Service: Cardiovascular;  Laterality: N/A;   TRIGGER FINGER RELEASE      Allergies  Allergies  Allergen Reactions   Aspirin Anaphylaxis and Hives    Pt states that she is unable to take because she had bleeding in her brain   Other Other (See Comments)    Pt states that she is unable to take blood thinners because she had bleeding in her brain; Blood Thinners-per [facility] at [facility] (Xarelto)    History of Present Illness    ***  Home Medications    Current Outpatient Medications  Medication Sig Dispense Refill   acetaminophen (TYLENOL) 325 MG tablet Take 325-650 mg by mouth every 6 (six) hours as needed for mild pain.     albuterol (ACCUNEB) 0.63 MG/3ML nebulizer solution Take 1 ampule by  nebulization every 6 (six) hours as needed for wheezing.     amiodarone (PACERONE) 200 MG tablet Take 1 tablet (200 mg total) by mouth daily. 30 tablet 6   apixaban (ELIQUIS) 5 MG TABS tablet Take 5 mg by mouth 2 (two) times daily.     benzonatate (TESSALON) 200 MG capsule Take 1 capsule (200 mg total) by mouth 3 (three) times daily as needed for cough. 20 capsule 0   cefadroxil (DURICEF) 500 MG capsule Take 1 capsule (500 mg total) by mouth 2 (two) times daily. 180 capsule 3   Difluprednate 0.05 % EMUL Place 1 drop into both eyes 2 (two) times daily.     doxycycline (VIBRA-TABS) 100 MG tablet Take 1 tablet (100 mg total) by mouth 2 (two) times daily. (Patient not taking: Reported on 11/28/2021) 14 tablet 0   DULoxetine (  CYMBALTA) 30 MG capsule Take 60 mg by mouth daily.     gabapentin (NEURONTIN) 300 MG capsule Take 2 capsules (600 mg total) by mouth 3 (three) times daily. (Patient taking differently: Take 300 mg by mouth 3 (three) times daily. Taking 600 mg in am, noon and 900 mg at night) 540 capsule 1   macitentan (OPSUMIT) 10 MG tablet Take 1 tablet (10 mg total) by mouth daily. 90 tablet 3   meclizine (ANTIVERT) 25 MG tablet Take 1 tablet (25 mg total) by mouth 3 (three) times daily as needed for dizziness. 30 tablet 1   Menthol, Topical Analgesic, (BIOFREEZE EX) Apply 1 application topically 4 (four) times daily as needed (for pain).     metolazone (ZAROXOLYN) 2.5 MG tablet Take 1 tablet by mouth as directed by the HF Clinic 1 tablet 0   midodrine (PROAMATINE) 10 MG tablet Take 1.5 tablets (15 mg total) by mouth 3 (three) times daily with meals. 135 tablet 3   potassium chloride SA (KLOR-CON M20) 20 MEQ tablet Take 2 tablets (40 mEq total) by mouth daily. 180 tablet 3   rosuvastatin (CRESTOR) 10 MG tablet Take 1 tablet (10 mg total) by mouth daily. 90 tablet 3   Selexipag (UPTRAVI) 1600 MCG TABS Take 1 tablet (1,600 mcg total) by mouth 2 (two) times daily. 60 tablet 11   sildenafil (REVATIO) 20  MG tablet Take 20 mg by mouth 3 (three) times daily.     torsemide (DEMADEX) 20 MG tablet Take 3 tablets (60 mg total) by mouth daily. 120 tablet 5   No current facility-administered medications for this visit.     Review of Systems    ***.  All other systems reviewed and are otherwise negative except as noted above.    Physical Exam    VS:  There were no vitals taken for this visit. , BMI There is no height or weight on file to calculate BMI.     GEN: Well nourished, well developed, in no acute distress. HEENT: normal. Neck: Supple, no JVD, carotid bruits, or masses. Cardiac: RRR, no murmurs, rubs, or gallops. No clubbing, cyanosis, edema.  Radials/DP/PT 2+ and equal bilaterally.  Respiratory:  Respirations regular and unlabored, clear to auscultation bilaterally. GI: Soft, nontender, nondistended, BS + x 4. MS: no deformity or atrophy. Skin: warm and dry, no rash. Neuro:  Strength and sensation are intact. Psych: Normal affect.  Accessory Clinical Findings    ECG personally reviewed by me today - *** - no acute changes.  Lab Results  Component Value Date   WBC 4.6 12/02/2021   HGB 9.8 (L) 12/02/2021   HCT 32.5 (L) 12/02/2021   MCV 91.0 12/02/2021   PLT 197 12/02/2021   Lab Results  Component Value Date   CREATININE 1.57 (H) 12/02/2021   BUN 33 (H) 12/02/2021   NA 140 12/02/2021   K 3.2 (L) 12/02/2021   CL 101 12/02/2021   CO2 31 12/02/2021   Lab Results  Component Value Date   ALT 10 11/25/2021   AST 12 11/25/2021   ALKPHOS 93 09/26/2021   BILITOT 0.4 11/25/2021   Lab Results  Component Value Date   CHOL 89 06/26/2021   HDL <10 (L) 06/26/2021   LDLCALC NOT CALCULATED 06/26/2021   TRIG 132 06/26/2021   CHOLHDL NOT CALCULATED 06/26/2021    Lab Results  Component Value Date   HGBA1C 5.4 06/25/2021    Assessment & Plan    1.  ***   Harrell Gave  Sharolyn Douglas, NP 12/04/2021, 8:50 AM

## 2021-12-05 ENCOUNTER — Encounter (INDEPENDENT_AMBULATORY_CARE_PROVIDER_SITE_OTHER): Payer: Medicare HMO | Admitting: Medical

## 2021-12-06 ENCOUNTER — Encounter: Payer: Self-pay | Admitting: Medical

## 2021-12-06 ENCOUNTER — Ambulatory Visit: Payer: Medicare HMO | Admitting: Family Medicine

## 2021-12-10 ENCOUNTER — Ambulatory Visit: Payer: Medicare HMO | Admitting: Medical

## 2021-12-10 ENCOUNTER — Ambulatory Visit
Admission: RE | Admit: 2021-12-10 | Discharge: 2021-12-10 | Disposition: A | Discharge status: Home / Self Care | Payer: Medicare HMO | Source: Ambulatory Visit | Attending: Internal Medicine | Admitting: Internal Medicine

## 2021-12-10 DIAGNOSIS — Y838 Other surgical procedures as the cause of abnormal reaction of the patient, or of later complication, without mention of misadventure at the time of the procedure: Secondary | ICD-10-CM | POA: Diagnosis not present

## 2021-12-10 DIAGNOSIS — Z950 Presence of cardiac pacemaker: Secondary | ICD-10-CM | POA: Insufficient documentation

## 2021-12-10 DIAGNOSIS — R0789 Other chest pain: Secondary | ICD-10-CM | POA: Diagnosis not present

## 2021-12-10 DIAGNOSIS — J479 Bronchiectasis, uncomplicated: Secondary | ICD-10-CM | POA: Diagnosis not present

## 2021-12-10 DIAGNOSIS — R600 Localized edema: Secondary | ICD-10-CM | POA: Insufficient documentation

## 2021-12-10 DIAGNOSIS — I517 Cardiomegaly: Secondary | ICD-10-CM | POA: Diagnosis not present

## 2021-12-10 DIAGNOSIS — T827XXA Infection and inflammatory reaction due to other cardiac and vascular devices, implants and grafts, initial encounter: Secondary | ICD-10-CM | POA: Insufficient documentation

## 2021-12-10 DIAGNOSIS — R7881 Bacteremia: Secondary | ICD-10-CM | POA: Insufficient documentation

## 2021-12-10 DIAGNOSIS — Z792 Long term (current) use of antibiotics: Secondary | ICD-10-CM | POA: Insufficient documentation

## 2021-12-10 DIAGNOSIS — J849 Interstitial pulmonary disease, unspecified: Secondary | ICD-10-CM | POA: Insufficient documentation

## 2021-12-10 DIAGNOSIS — T82199A Other mechanical complication of unspecified cardiac device, initial encounter: Secondary | ICD-10-CM | POA: Insufficient documentation

## 2021-12-10 DIAGNOSIS — I700 Atherosclerosis of aorta: Secondary | ICD-10-CM | POA: Insufficient documentation

## 2021-12-10 DIAGNOSIS — Y718 Miscellaneous cardiovascular devices associated with adverse incidents, not elsewhere classified: Secondary | ICD-10-CM | POA: Insufficient documentation

## 2021-12-10 LAB — POCT I-STAT CREATININE: Creatinine, Ser: 1.3 mg/dL — ABNORMAL HIGH (ref 0.44–1.00)

## 2021-12-10 MED ORDER — IOHEXOL 300 MG/ML  SOLN
60.0000 mL | Freq: Once | INTRAMUSCULAR | Status: AC | PRN
Start: 2021-12-10 — End: 2021-12-10
  Administered 2021-12-10: 60 mL via INTRAVENOUS

## 2021-12-10 NOTE — Progress Notes (Signed)
Remote pacemaker transmission.   

## 2021-12-11 ENCOUNTER — Encounter (HOSPITAL_COMMUNITY): Payer: Self-pay

## 2021-12-12 ENCOUNTER — Encounter (HOSPITAL_COMMUNITY): Payer: Self-pay

## 2021-12-16 ENCOUNTER — Encounter (HOSPITAL_COMMUNITY): Payer: Self-pay

## 2021-12-16 ENCOUNTER — Other Ambulatory Visit (HOSPITAL_COMMUNITY): Payer: Self-pay

## 2021-12-17 ENCOUNTER — Encounter (HOSPITAL_COMMUNITY): Payer: Self-pay

## 2021-12-18 ENCOUNTER — Ambulatory Visit: Payer: Medicare HMO

## 2021-12-18 ENCOUNTER — Telehealth: Payer: Self-pay

## 2021-12-18 NOTE — Telephone Encounter (Signed)
Pt returning call... Reschedule pt for 12/27/2021 at 12:30pm..[name]

## 2021-12-18 NOTE — Telephone Encounter (Signed)
No answer when called for scheduled AWV. Left message to call the office back and reschedule for another day.

## 2021-12-20 ENCOUNTER — Ambulatory Visit (HOSPITAL_COMMUNITY)
Admission: RE | Admit: 2021-12-20 | Discharge: 2021-12-20 | Disposition: A | Discharge status: Home / Self Care | Payer: Medicare HMO | Source: Ambulatory Visit | Attending: Family Medicine | Admitting: Family Medicine

## 2021-12-20 ENCOUNTER — Encounter (HOSPITAL_COMMUNITY): Payer: Self-pay

## 2021-12-20 VITALS — BP 148/74 | HR 60 | Wt 187.4 lb

## 2021-12-20 DIAGNOSIS — I951 Orthostatic hypotension: Secondary | ICD-10-CM | POA: Diagnosis not present

## 2021-12-20 DIAGNOSIS — I5033 Acute on chronic diastolic (congestive) heart failure: Secondary | ICD-10-CM | POA: Insufficient documentation

## 2021-12-20 DIAGNOSIS — I5032 Chronic diastolic (congestive) heart failure: Secondary | ICD-10-CM

## 2021-12-20 DIAGNOSIS — G4733 Obstructive sleep apnea (adult) (pediatric): Secondary | ICD-10-CM | POA: Diagnosis not present

## 2021-12-20 DIAGNOSIS — J8410 Pulmonary fibrosis, unspecified: Secondary | ICD-10-CM | POA: Diagnosis not present

## 2021-12-20 DIAGNOSIS — I495 Sick sinus syndrome: Secondary | ICD-10-CM | POA: Insufficient documentation

## 2021-12-20 DIAGNOSIS — I10 Essential (primary) hypertension: Secondary | ICD-10-CM

## 2021-12-20 DIAGNOSIS — I480 Paroxysmal atrial fibrillation: Secondary | ICD-10-CM | POA: Insufficient documentation

## 2021-12-20 DIAGNOSIS — I272 Pulmonary hypertension, unspecified: Secondary | ICD-10-CM | POA: Insufficient documentation

## 2021-12-20 DIAGNOSIS — I2721 Secondary pulmonary arterial hypertension: Secondary | ICD-10-CM

## 2021-12-20 DIAGNOSIS — Z86718 Personal history of other venous thrombosis and embolism: Secondary | ICD-10-CM | POA: Insufficient documentation

## 2021-12-20 DIAGNOSIS — I890 Lymphedema, not elsewhere classified: Secondary | ICD-10-CM | POA: Insufficient documentation

## 2021-12-20 DIAGNOSIS — I2690 Septic pulmonary embolism without acute cor pulmonale: Secondary | ICD-10-CM | POA: Diagnosis not present

## 2021-12-20 DIAGNOSIS — Z9981 Dependence on supplemental oxygen: Secondary | ICD-10-CM | POA: Insufficient documentation

## 2021-12-20 DIAGNOSIS — Z7901 Long term (current) use of anticoagulants: Secondary | ICD-10-CM | POA: Insufficient documentation

## 2021-12-20 DIAGNOSIS — R42 Dizziness and giddiness: Secondary | ICD-10-CM

## 2021-12-20 DIAGNOSIS — I130 Hypertensive heart and chronic kidney disease with heart failure and stage 1 through stage 4 chronic kidney disease, or unspecified chronic kidney disease: Secondary | ICD-10-CM | POA: Diagnosis not present

## 2021-12-20 DIAGNOSIS — Z9989 Dependence on other enabling machines and devices: Secondary | ICD-10-CM

## 2021-12-20 DIAGNOSIS — Z8679 Personal history of other diseases of the circulatory system: Secondary | ICD-10-CM | POA: Diagnosis not present

## 2021-12-20 DIAGNOSIS — J9611 Chronic respiratory failure with hypoxia: Secondary | ICD-10-CM | POA: Diagnosis not present

## 2021-12-20 DIAGNOSIS — Z79899 Other long term (current) drug therapy: Secondary | ICD-10-CM | POA: Insufficient documentation

## 2021-12-20 DIAGNOSIS — R7881 Bacteremia: Secondary | ICD-10-CM | POA: Insufficient documentation

## 2021-12-20 LAB — BASIC METABOLIC PANEL
Anion gap: 9 (ref 5–15)
BUN: 25 mg/dL — ABNORMAL HIGH (ref 8–23)
CO2: 30 mmol/L (ref 22–32)
Calcium: 9.3 mg/dL (ref 8.9–10.3)
Chloride: 103 mmol/L (ref 98–111)
Creatinine, Ser: 1.25 mg/dL — ABNORMAL HIGH (ref 0.44–1.00)
GFR, Estimated: 44 mL/min — ABNORMAL LOW (ref >60)
Glucose, Bld: 116 mg/dL — ABNORMAL HIGH (ref 70–99)
Potassium: 4.1 mmol/L (ref 3.5–5.1)
Sodium: 142 mmol/L (ref 135–145)

## 2021-12-20 LAB — BRAIN NATRIURETIC PEPTIDE: B Natriuretic Peptide: 157.5 pg/mL — ABNORMAL HIGH (ref 0.0–100.0)

## 2021-12-20 MED ORDER — MIDODRINE HCL 10 MG PO TABS: 10.0000 mg | ORAL_TABLET | Freq: Three times a day (TID) | ORAL | 6 refills | Status: DC

## 2021-12-20 MED ORDER — TORSEMIDE 20 MG PO TABS: 60.0000 mg | ORAL_TABLET | Freq: Two times a day (BID) | ORAL | 6 refills | Status: DC

## 2021-12-20 NOTE — Progress Notes (Signed)
[facility]   Date:  12/20/2021   ID:  Debra Saunders, DOB [birth year], MRN [id number]   Provider location: [place] [facility] Type of Visit: Established patient   PCP:  [name], MD  Cardiologist:  [name], MD HF Cardiologist: Dr. [name]  Chief Complaint: HF follow up   HPI: Debra Saunders is a 76 y.o. 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   Admitted 10/24-10/29/19 with acute on chronic diastolic HF. HF team consulted with concerns for low output. PICC was placed and milrinone to support RV function was started along with IV Lasix to facilitate diuresis. RHC done to evaluate PAH (see below). V/Q scan negative. High res CT showed pulmonary fibrosis. Pulmonary consult and recommended stopping amiodarone. She was transitioned to torsemide 60 mg BID. DC weight: 226 lbs.    She has started on selexipag for pulmonary hypertension in addition to Opsumit and Revatio.      Echo in 3/21 showed EF 60-65%, normal RV, PASP 50 mmHg, moderate-severe MR.   Had [place] 08/30/2020- low/normal filling pressures, mild pumonary htn, and preserved cardiac. output.   Admitted to [facility] 1/23 with endocarditis and PNA, requiring pressors. BCx positive for Streptococcus group G.  All her PAH meds were held. TEE showed a large vegetation on the pacer wire in the right atrium above the tricuspid valve (1 x 1.5 cm).  No vegetations noted on the valves.  EF was 55% and there was note of mild to moderate mitral regurgitation. RV ok. She was transferred to [facility] for pacemaker extraction. EP felt she was not a candidate due to multiple comorbidities. ID  consulted and planned for 6 weeks of IV abx. She developed Afib with RVR and started on amio. She underwent successful TEE-guided DCCV 07/14/21. Hospitalization complicated by anemia, hypotension, and AKI.  Midodrine started during admission. She was discharged to SNF, weight 198 lbs.  Admitted 3/23 with lightheadedness. Treated for sinus infection, HF meds held due to othostasis. Midodrine increased to 10 mg tid. HH PT arranged.  Repeat echo in 3/23 showed EF 60-65%, mild LVH, normal RV, IVC normal, no definite vegetation noted.   Follow up 4/23, she was volume overloaded and torsemide increased to 40 bid, midodrine increased to 15 mg tid.   Today she returns for HF follow up with her daughter. Overall feeling fair. Breathing is about "the same." Remains on 4L oxygen and she has dyspnea with house chores and walking further distances on flat ground. Daughter says she pushes herself. She chronically sleeps reclined in hospital bed. Compliant with abdominal binder and compression hose. Drinks a lot of fluid during the day and feels she is swelling more. Denies CP, dizziness, edema, or PND. Appetite ok. No fever or chills. Taking all medications.   6 minute walk (1/20): 98 m 6 minute walk (7/20): 213 m 6 minute walk (3/22): 198 m 6 minute walk (10/22): 229 m   Labs (11/19): digoxin 0.8 Labs (1/20): K 4.2, creatinine 1.36 => 1.33, digoxin 0.8 Labs (2/20): digoxin 0.2, K 4.3, creatinine 1.32 Labs (5/20): K 4.5, creatinine 1.4, digoxin 0.8 Labs (1/22): K 4, creatinine 1.07 Labs (8/22): K 4.1, creatinine 1.03 Labs (  1/23): K 3.7, creatinine 1.29 Labs (1/23): K 4.7, creatinine 1.07 Labs (3/23): K 4.3, creatinine 1.24 => 1.07, hgb 9.4 Labs (4/23): K 5.0, creatinine 1.58, TSH normal, LFTs normal Labs (6/23): K 3.2, creatinine 1.57   PMH: 1. Lymphedema: Left lower leg (chronic).  2. COPD 3. 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  - PFTs (2017) with FEV1 64%, FEV1/FVC ratio  106%, TLC 49%, DLCO 39% (restrictive).  - CT chest high resolution (10/19): Pulmonary parenchymal pattern of fibrosis.  - CT chest (4/22): Chronic ILD 4. Atrial fibrillation: Paroxysmal.  She was previously off amiodarone due to lung disease.  - 2/19 DCCV.  - 1/23 DCCV 5. H/o DVT s/p IVC filter in 2004.  6. CAD: 4/19 coronary angiography with minimal coronary disease.  7. Bradycardia: [employer] PPM.  8. H/o traumatic SAH 2014.  9. CKD: Stage 3.  10. H/o CVA 11. OSA: CPAP 12. HTN 13. Type II diabetes 14. Pulmonary hypertension:  - PFTs 2017 with severe restriction.  - Echo (10/19): EF 60-65%, PASP 52 mmHg - RHC (4/19): Severe PAH with PVR 7.2 WU.  - RHC (10/19): mean RA 6, PA 56/17 mean 33, PCWP mean 12, CI 3.23, PVR 3.4 WU.  - RHC (3/22): mean 3, RV 30/1, PA 33/11, mean 20, PCWP mean 6, Oxygen saturations: PA 69% AO 100%, Cardiac Output (Fick) 6.18, Cardiac Index (Fick) 3.36, and PVR 2.3 WU - V/Q scan (10/19): No chronic PE.  - Anti-SCL 70 and ANA negative.  ACE normal.  RF slightly elevated at 15.  - Echo (3/21): EF 60-65%, normal RV, PASP 50 mmHg, moderate-severe MR. - TEE (1/23): EF 60-65%, mild RV dilation with normal RV systolic function, MR mild-moderate, moderate TR with vegetation on TV and pacemaker lead - Echo (1/23): EF 60-65%, normal RV, RVSP 51.5 mmHg, mild-moderate MR, moderate TR - Echo (3/23): EF 60-65%, mild LVH, normal RV, IVC normal, no definite vegetation noted.  FH: mother with CHF 56. 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  16. Mitral regurgitation: moderate to severe on 3/22 echo.  17. Zio patch (3/22): 9.3% PACs, no AF, 2.9% PVCs 18. 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  Deemed too high risk to remove device so have tried to treat through.   Current Outpatient Medications  Medication Sig Dispense Refill   acetaminophen (TYLENOL) 325 MG tablet Take 325-650 mg by mouth every 6 (six) hours as needed for  mild pain.     albuterol (ACCUNEB) 0.63 MG/3ML nebulizer solution Take 1 ampule by nebulization every 6 (six) hours as needed for wheezing.     amiodarone (PACERONE) 200 MG tablet Take 1 tablet (200 mg total) by mouth daily. 30 tablet 6   apixaban (ELIQUIS) 5 MG TABS tablet Take 5 mg by mouth 2 (two) times daily.     benzonatate (TESSALON) 200 MG capsule Take 1 capsule (200 mg total) by mouth 3 (three) times daily as needed for cough. 20 capsule 0   cefadroxil (DURICEF) 500 MG capsule Take 1 capsule (500 mg total) by mouth 2 (two) times daily. 180 capsule 3   Difluprednate 0.05 % EMUL Place 1 drop into both eyes 2 (two) times daily.     doxycycline (VIBRA-TABS) 100 MG tablet Take 1 tablet (100 mg total) by mouth 2 (two) times daily. 14 tablet 0   DULoxetine (CYMBALTA) 30 MG capsule Take 60 mg by mouth daily.     macitentan (OPSUMIT) 10 MG tablet Take 1 tablet (  10 mg total) by mouth daily. 90 tablet 3   meclizine (ANTIVERT) 25 MG tablet Take 1 tablet (25 mg total) by mouth 3 (three) times daily as needed for dizziness. 30 tablet 1   Menthol, Topical Analgesic, (BIOFREEZE EX) Apply 1 application topically 4 (four) times daily as needed (for pain).     midodrine (PROAMATINE) 10 MG tablet Take 1.5 tablets (15 mg total) by mouth 3 (three) times daily with meals. 135 tablet 3   Potassium Chloride (KLOR-CON PO) Take 20 mEq by mouth 2 (two) times daily.     rosuvastatin (CRESTOR) 10 MG tablet Take 1 tablet (10 mg total) by mouth daily. 90 tablet 3   Selexipag (UPTRAVI) 1600 MCG TABS Take 1 tablet (1,600 mcg total) by mouth 2 (two) times daily. 60 tablet 11   torsemide (DEMADEX) 20 MG tablet Patient takes 3 tablets by mouth twice a day.     gabapentin (NEURONTIN) 300 MG capsule Take 2 capsules (600 mg total) by mouth 3 (three) times daily. (Patient taking differently: Take 300 mg by mouth 3 (three) times daily. Taking 600 mg in am, noon and 900 mg at night) 540 capsule 1   metolazone (ZAROXOLYN) 2.5 MG  tablet Take 1 tablet by mouth as directed by the HF Clinic (Patient not taking: Reported on 12/20/2021) 1 tablet 0   No current facility-administered medications for this encounter.   Allergies:   Aspirin and Other   Social History:  The patient  reports that she quit smoking about 31 years ago. Her smoking use included cigarettes. She has a 0.60 pack-year smoking history. She has never used smokeless tobacco. She reports that she does not drink alcohol and does not use drugs.   Family History:  The patient's family history includes Bone cancer in her sister; Breast cancer in her maternal aunt and sister; COPD in her mother; Heart disease in her mother; Hypertension in her father, mother, sister, and sister; Thyroid disease in her mother, sister, and sister.   ROS:  Please see the history of present illness.   All other systems are personally reviewed and negative.   Physical Exam:   General:  NAD. No resp difficulty, arrived in [facility] on oxygen, fatigued-appearing. HEENT: Normal Neck: Supple. JVP 7-8. Carotids 2+ bilat; no bruits. No lymphadenopathy or thryomegaly appreciated. Cor: PMI nondisplaced. Regular rate & rhythm. No rubs, gallops, 2/6 SEM RUSB Lungs: Clear Abdomen: Obese, nontender, nondistended. No hepatosplenomegaly. No bruits or masses. Good bowel sounds. Extremities: No cyanosis, clubbing, rash, 1+ BLE edema; chronic LLE lymphedema. Neuro: Alert & oriented x 3, cranial nerves grossly intact. Moves all 4 extremities w/o difficulty. Affect pleasant.  Recent Labs: 08/27/2021: Magnesium 2.5 09/26/2021: TSH 0.463 10/23/2021: B Natriuretic Peptide 66.8 11/25/2021: ALT 10 12/02/2021: BUN 33; Hemoglobin 9.8; Platelets 197; Potassium 3.2; Sodium 140 12/10/2021: Creatinine, Ser 1.30  Personally reviewed   Wt Readings from Last 3 Encounters:  12/20/21 85 kg (187 lb 6.4 oz)  12/02/21 86 kg (189 lb 9.6 oz)  11/28/21 87.5 kg (193 lb)    BP (!) 148/74   Pulse 60   Wt 85 kg (187 lb 6.4 oz)    SpO2 97% Comment: Patient is on 4 liters of room oxygen.  BMI 36.60 kg/m   ASSESSMENT AND PLAN: 1. Atrial fibrillation with tachy-brady syndrome:  Had been off amiodarone since 2019 due to interstitial lung disease/pulmonary fibrosis. Amiodarone restarted this admission 1/23. s/p TEE-guided DCCV 1/23. She is regular on exam today.  - Continue amiodarone 200  mg daily. LFTs/TSH ok 4/23. Will need regular eye exam.  - Continue Eliquis 5 mg bid.   2. 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 NYHA III, functional class difficult due to fatigue and physical deconditioning. She has orthostatic symptoms and uses midodrine. She remains volume overloaded today on exam. - Increase torsemide to 80 mg bid + extra 40 KCL x 3 days, then back to torsemide 60 mg bid with 40 KCL bid. BMET/BNP today, repeat BMET in 10-14 days. - Continue spiro 12.5 mg daily. - Will not start SGLT2 inhibitor given issues with orthostasis. - We discussed limiting her fluids to < 2L/day. 3. Pulmonary hypertension: Based on 4/19 cath, she had severe PAH with PVR 7.2 WU. Chronic hypoxemic respiratory failure, on home oxygen.  She has a history of remote DVT (2004) but V/Q scan negative for evidence of chronic PE.  High resolution CT chest in 10/19 showed pulmonary fibrosis, and PFTs in 2017 showed severe restriction.  Serological workup for rheumatological diseases was unremarkable.  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 Amiodarone was stopped in past given lung parenchymal disease. 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  - She is back on sildenafil 20 tid and macitentan 10 daily.  - Selexipag home dose 1600 bid.   - Home  oxygen, CPAP.  4. Endocarditis: in 1/23.  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  Group G strep bacteremia.  Septic emboli to lungs. Seen by EP, thought to be too high risk for device extraction. Unfortunately she will be at high risk for complications going forward.  - She is now on lifetime suppression with cefadroxil. She is followed by Dr. Gale Journey. 5. Chronic hypoxemic respiratory failure:  At baseline, on 4L home oxygen with CPAP at night for interstitial lung disease and OSA.   - Continue home O2 4 L by [place] (baseline).  6. HTN: BP elevated today, decreased midodrine to 10 mg tid. 7. H/o DVT: Anticoagulated.  8. Left leg lymphedema: Chronic.  9. OSA: CPAP.  10. Orthostatic hypotension: Ongoing symptoms but seem improved with abdominal binder and compression stockings.  - Decrease midodrine to 10 mg tid.   Keep follow up with Dr. [name] as scheduled.  Signed, Rafael Bihari, FNP  12/20/2021  [facility] [address] [facility] [place] [address] [phone] (office) [phone] (fax)

## 2021-12-20 NOTE — Patient Instructions (Addendum)
Thank you for coming in today  Labs were done today, if any labs are abnormal the clinic will call you No news is good news  DECREASE Midodrine to 10 mg three times a day  INCREASE Torsemide to 80 mg twice daily with extra 40 of mew of potassium for 3 days then 60 mg twice daily and 40 of potassium twice daily   Your physician recommends that you return for lab work in: 10-14 days   Your physician recommends that you schedule a follow-up appointment in: keep follow up appointment   At the [facility], you and your health needs are our priority. As part of our continuing mission to provide you with exceptional heart care, we have created designated Provider Care Teams. These Care Teams include your primary Cardiologist (physician) and Advanced Practice Providers (APPs- Physician Assistants and Nurse Practitioners) who all work together to provide you with the care you need, when you need it.   You may see any of the following providers on your designated Care Team at your next follow up: Dr Glori Bickers Dr Haynes Kerns, NP Lyda Jester, [place] [facility] [place], [place] Audry Riles, PharmD   Please be sure to bring in all your medications bottles to every appointment.   If you have any questions or concerns before your next appointment please send [place] a message through [place] or call our office at [phone].    TO LEAVE A MESSAGE FOR THE NURSE SELECT OPTION 2, PLEASE LEAVE A MESSAGE INCLUDING: YOUR NAME DATE OF BIRTH CALL BACK NUMBER REASON FOR CALL**this is important as we prioritize the call backs  YOU WILL RECEIVE A CALL BACK THE SAME DAY AS LONG AS YOU CALL BEFORE 4:00 PM

## 2021-12-26 ENCOUNTER — Other Ambulatory Visit (HOSPITAL_COMMUNITY): Payer: Self-pay

## 2021-12-26 NOTE — Progress Notes (Signed)
Today had a home visit with [name].  She is still dizzy and has no complaints of increased shortness of breath or chest pain.  She states will be making an appt with neurology to see if they can figure the dizziness out.  She stepped out on the front porch and appears to be doing well today.  Mood was good.  Gave her 2 cases of depends that was donated.  She advised me on Monday that she has a $500 bill from [employer] for her uptravi and they want $800 to ship this months.  [place] in HF clinic and advised her but she contacted patient with grant info and stated she could not get in touch with [employer].  Patient is unable to advise [employer] about grant info.  I cntacted [employer] and gave the grant info and they advised will take 5 to 7 days to update their system and then they will contact patient for a shipment.  Asked if they could expedite shipment, they advised no.  Patient may not have enough to last til then, advised pharmacist of this, have not heard back and advised her of the bill.  Patient states does not have the money to pay the bill.  She is aware of up coming appts and has rest of her medications.  She tries to watch high sodium foods and she weighs daily.  She did make the med changes that was made at her HF clinic appt.  Will continue to visit for heart failure, diet and medication management.   [place] [phone]

## 2021-12-27 ENCOUNTER — Telehealth: Payer: Self-pay

## 2021-12-27 ENCOUNTER — Other Ambulatory Visit: Payer: Self-pay | Admitting: Cardiovascular Disease

## 2021-12-27 ENCOUNTER — Ambulatory Visit: Payer: Medicare HMO

## 2021-12-27 DIAGNOSIS — I4819 Other persistent atrial fibrillation: Secondary | ICD-10-CM

## 2021-12-27 NOTE — Telephone Encounter (Signed)
Refill request

## 2021-12-27 NOTE — Telephone Encounter (Signed)
Unable to reach patient for scheduled AWV. No answer. Okay to reschedule for another day.

## 2021-12-27 NOTE — Telephone Encounter (Signed)
Prescription refill request for Eliquis received. Indication: 12/20/21 Last office visit: a fib Scr: 1.25 Age: 76 Weight: 86kg

## 2022-01-07 ENCOUNTER — Encounter: Payer: Self-pay | Admitting: Nurse Practitioner

## 2022-01-07 ENCOUNTER — Other Ambulatory Visit
Admission: RE | Admit: 2022-01-07 | Discharge: 2022-01-07 | Disposition: A | Discharge status: Home / Self Care | Payer: Medicare HMO | Attending: Family Medicine | Admitting: Family Medicine

## 2022-01-07 ENCOUNTER — Ambulatory Visit (INDEPENDENT_AMBULATORY_CARE_PROVIDER_SITE_OTHER): Payer: Medicare HMO | Admitting: Nurse Practitioner

## 2022-01-07 VITALS — BP 140/58 | HR 60 | Ht 60.0 in | Wt 193.6 lb

## 2022-01-07 DIAGNOSIS — I5033 Acute on chronic diastolic (congestive) heart failure: Secondary | ICD-10-CM

## 2022-01-07 DIAGNOSIS — I4819 Other persistent atrial fibrillation: Secondary | ICD-10-CM

## 2022-01-07 DIAGNOSIS — E782 Mixed hyperlipidemia: Secondary | ICD-10-CM | POA: Diagnosis not present

## 2022-01-07 DIAGNOSIS — I2721 Secondary pulmonary arterial hypertension: Secondary | ICD-10-CM

## 2022-01-07 DIAGNOSIS — I10 Essential (primary) hypertension: Secondary | ICD-10-CM | POA: Diagnosis not present

## 2022-01-07 DIAGNOSIS — J849 Interstitial pulmonary disease, unspecified: Secondary | ICD-10-CM | POA: Diagnosis not present

## 2022-01-07 DIAGNOSIS — I5032 Chronic diastolic (congestive) heart failure: Secondary | ICD-10-CM | POA: Insufficient documentation

## 2022-01-07 LAB — BASIC METABOLIC PANEL
Anion gap: 7 (ref 5–15)
BUN: 22 mg/dL (ref 8–23)
CO2: 30 mmol/L (ref 22–32)
Calcium: 8.8 mg/dL — ABNORMAL LOW (ref 8.9–10.3)
Chloride: 104 mmol/L (ref 98–111)
Creatinine, Ser: 1.1 mg/dL — ABNORMAL HIGH (ref 0.44–1.00)
GFR, Estimated: 51 mL/min — ABNORMAL LOW (ref >60)
Glucose, Bld: 129 mg/dL — ABNORMAL HIGH (ref 70–99)
Potassium: 3.9 mmol/L (ref 3.5–5.1)
Sodium: 141 mmol/L (ref 135–145)

## 2022-01-07 NOTE — Progress Notes (Signed)
Office Visit    Patient Name: Debra Saunders Date of Encounter: 01/07/2022  Primary Care Provider:  [name], MD Primary Cardiologist:  [name], MD  Chief Complaint    76 year old female with a history of chronic combined systolic and diastolic congestive heart failure with improved EF, pulmonary hypertension, nonobstructive CAD (April 2019), persistent atrial fibrillation on Eliquis, symptomatic bradycardia status post permanent pacemaker, frequent PVCs, chronic respiratory failure with hypoxia on home O2, COPD, interstitial lung disease, type 2 diabetes mellitus, hypertension, hyperlipidemia, obesity, traumatic subarachnoid hemorrhage in the setting of Xarelto, left-sided DVT status post IVC filter, chronic right-sided atypical chest pain, chronic left lower extremity lymphedema, prior stroke, sleep apnea, and cardiorenal syndrome, who presents for follow-up of CHF.  Past Medical History    Past Medical History:  Diagnosis Date   Arthritis    Atrial fibrillation, persistent ([place])    a. s/p TEE-guided DCCV on 08/20/2017; b. 06/2021 Recurrent Afib-->amio/TEE/DCCV.   CAD (coronary artery disease)    a. 01/2012 Cath: nonobs dzs; b. 04/2014 MV: No ischemia; c. 09/2017 Cath: Minimal nonobs RCA dzs, otw nl cors.   Cardiorenal syndrome    a. 09/2017 Creat rose to 2.7 w/ diuresis-->HD x 2.   Chronic back pain    Chronic combined systolic (congestive) and diastolic (congestive) heart failure ([place])    a. Dx 2005 @ [facility];  b. 02/2014 Echo: EF 55-60%; c. 07/2017: EF 30-35%; d. 09/2017 Echo: EF 40-45%; e. 02/2018 Echo: EF 55-60%; f. 08/2019 Echo: EF 60-65%; g. 08/2020 RHC: RA 3, RV 30/1, PA 33/11 (20), PCWP 6; f. 08/2021 Echo: EF 60-65%, no rwma, GrIDD, nl RV fxn, RVSP 40.59mHg, mod dil LA, mod TR.   Chronic respiratory failure (HCC)    a. on home O2   Gastritis    History of intracranial hemorrhage    a. 6/14 described by neurosurgery as "small frontal posttraumatic SAH " - pt was on  xarelto @ the time.   Hyperlipidemia    Hypertension    Interstitial lung disease (HCC)    Lipoma of back    Lymphedema    a. Chronic LLE edema.   NICM (nonischemic cardiomyopathy) ([place]    a. 09/2017 Echo: EF 40-45%; b. 09/2017 Cath: minor, insignificant CAD; c. 02/2018 Echo: EF 55-60%, Gr1 DD; d. 08/2019 Echo: EF 60-65%, Gr2 DD.   Obesity    PAH (pulmonary artery hypertension) ([place]    a. 09/2017 Echo: PASP 647mg; b. 09/2017 RHC: RA 23, RV 84/13, PA 84/29, PCWP 20, CO 3.89, CI 1.89. LVEDP 18; c. 08/2020 RHC: RA 3, RV 30/1, PA 33/11 (20), PCWP 6. CO/CI 6.18/3.36. PVR 2.3 WU.   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 06/2021. Veg on PPM lead. Not candidate for extraction-->s/p 6 wks abx.   Sepsis with metabolic encephalopathy ([place]   Sleep apnea    Streptococcal bacteremia    [facility]   Subarachnoid hemorrhage (HCEcho07/30/2014   Symptomatic bradycardia    a. s/p BSX PPM.   Thyroid nodule    Type II diabetes mellitus ([facility]   Urinary incontinence    Past Surgical History:  Procedure Laterality Date   ABDOMINAL HYSTERECTOMY  1969   BREAST BIOPSY Right 10/28/2019   lymph node bx, [place], pending path    [place]   [place] 20% ostial left circumflex.   CARDIOVERSION N/A 10/12/2017  Procedure: CARDIOVERSION;  Surgeon: Minna Merritts, MD;  Location: [facility];  Service: Cardiovascular;  Laterality: N/A;   CARDIOVERSION N/A 07/04/2021   Procedure: CARDIOVERSION;  Surgeon: Larey Dresser, MD;  Location: [facility] ENDOSCOPY;  Service: Cardiovascular;  Laterality: N/A;   CATARACT EXTRACTION W/PHACO Right 09/19/2020   Procedure: CATARACT EXTRACTION PHACO AND INTRAOCULAR LENS PLACEMENT (IOC) RIGHT DIABETIC 3.95 00:43.0 9.2%;  Surgeon: Leandrew Koyanagi, MD;  Location: [place];  Service: Ophthalmology;  Laterality: Right;  Pt requests to be last sleep apnea   CATARACT  EXTRACTION W/PHACO Left 10/03/2020   Procedure: CATARACT EXTRACTION PHACO AND INTRAOCULAR LENS PLACEMENT ([place]) LEFT DIABETIC;  Surgeon: Leandrew Koyanagi, MD;  Location: [place];  Service: Ophthalmology;  Laterality: Left;  3.25 0:59.3 5.5%   CHOLECYSTECTOMY     COLONOSCOPY WITH PROPOFOL N/A 09/10/2018   Procedure: COLONOSCOPY WITH PROPOFOL;  Surgeon: Manya Silvas, MD;  Location[facility] ENDOSCOPY;  Service: Endoscopy;  Laterality: N/A;   CYST REMOVAL TRUNK  2002   BACK   DIALYSIS/PERMA CATHETER INSERTION N/A 10/06/2017   Procedure: DIALYSIS/PERMA CATHETER INSERTION;  Surgeon: Katha Cabal, MD;  Location: [place] CV LAB;  Service: Cardiovascular;  Laterality: N/A;   INSERT / REPLACE / REMOVE PACEMAKER     lipoma removal  2014   back   PACEMAKER INSERTION  2015   [employer] dual chamber pacemaker implanted by Dr Caryl Comes for symptomatic bradycardia   PERMANENT PACEMAKER INSERTION N/A 01/09/2014   Procedure: PERMANENT PACEMAKER INSERTION;  Surgeon: Deboraha Sprang, MD;  Location: [facility] CATH LAB;  Service: Cardiovascular;  Laterality: N/A;   RIGHT HEART CATH N/A 04/19/2018   Procedure: RIGHT HEART CATH;  Surgeon: Larey Dresser, MD;  Location: [place] CV LAB;  Service: Cardiovascular;  Laterality: N/A;   RIGHT HEART CATH N/A 08/30/2020   Procedure: RIGHT HEART CATH;  Surgeon: Larey Dresser, MD;  Location: [place] CV LAB;  Service: Cardiovascular;  Laterality: N/A;   RIGHT/LEFT HEART CATH AND CORONARY ANGIOGRAPHY N/A 10/19/2017   Procedure: RIGHT/LEFT HEART CATH AND CORONARY ANGIOGRAPHY;  Surgeon: Wellington Hampshire, MD;  Location: [place] CV LAB;  Service: Cardiovascular;  Laterality: N/A;   TEE WITHOUT CARDIOVERSION N/A 08/20/2017   Procedure: TRANSESOPHAGEAL ECHOCARDIOGRAM (TEE) & Direct current cardioversion;  Surgeon: Minna Merritts, MD;  Location: [facility];  Service: Cardiovascular;  Laterality: N/A;   TEE WITHOUT CARDIOVERSION N/A 06/28/2021    Procedure: TRANSESOPHAGEAL ECHOCARDIOGRAM (TEE);  Surgeon: Minna Merritts, MD;  Location: [facility];  Service: Cardiovascular;  Laterality: N/A;   TEE WITHOUT CARDIOVERSION N/A 07/04/2021   Procedure: TRANSESOPHAGEAL ECHOCARDIOGRAM (TEE);  Surgeon: Larey Dresser, MD;  Location: [facility] ENDOSCOPY;  Service: Cardiovascular;  Laterality: N/A;   TRIGGER FINGER RELEASE      Allergies  Allergies  Allergen Reactions   Aspirin Anaphylaxis and Hives    Pt states that she is unable to take because she had bleeding in her brain   Other Other (See Comments)    Pt states that she is unable to take blood thinners because she had bleeding in her brain; Blood Thinners-per doctor at [facility] (Xarelto)    History of Present Illness    76 year old female with the above complex past medical history including chronic combined systolic and diastolic congestive heart failure with improved ejection fraction, pulmonary hypertension, nonobstructive CAD, persistent atrial fibrillation on Eliquis, symptomatic bradycardia status post permanent pacemaker, frequent PVCs, chronic respiratory failure with hypoxia on home O2, COPD, interstitial lung disease (  amiodarone previously discontinued out of concern for toxicity), pulmonary hypertension, type diabetes mellitus, hypertension, hyperlipidemia, obesity, traumatic subarachnoid hemorrhage in the setting of Xarelto, left-sided DVT status post IVC filter, chronic right-sided atypical chest pain, chronic left lower extremity lymphedema, prior stroke, sleep apnea, and cardiorenal syndrome.  She previously underwent diagnostic catheterization August 2013, with nonobstructive disease, followed by negative stress testing in 2015.  She has a long history of diastolic heart failure and at 1 point and febrile to thousand 19, EF was down to 30-35% but this subsequently improved.  Diagnostic catheterization in April 2019, in the setting of chest pain showed minor irregularities in the right  coronary artery but otherwise normal coronary arteries, and she was medically managed.  She has been followed by the advanced heart failure team in [place] and managed with Opsumit, Uptravi, and we have audio.  Most recent right heart catheterization in March 2022 showed a PA of 33/11 (20).  In January 2023, Ms. Kimbell was admitted to [facility] with pneumonia and endocarditis requiring vasopressor therapy.  Blood cultures were positive for Streptococcus group G.  All pulmonary hypertension medications were held.  TEE showed a large vegetation on the pacer wire in the right atrium above the tricuspid valve (1 x 1.5 cm).  No vegetations were noted on the valves.  EF was 55% and there was mild to moderate mitral regurgitation.  She was transferred to [facility] for pacemaker extraction.  EP felt that she was not a candidate due to multiple comorbidities.  ID was consulted and plan for 6 weeks of IV antibiotics and she has since been on cefadroxil.  She developed atrial fibrillation with RVR during hospitalization and was started on amiodarone and subsequently underwent successful TEE guided cardioversion July 14, 2021.  Hospitalization was otherwise complicated by anemia, hypotension, and acute kidney injury.  She did require initiation of midodrine.  Most recent echo in March 2023 showed an EF of 60 to 65% with normal RV function, RVSP of 40.3 mmHg, and moderate TR. she required adjustment in outpatient diuretic dosing at heart failure follow-up in April 2023-increased to 40 mg twice daily.  At that time she was back on all 3 of her pulmonary hypertension medications.  Ms. Macpherson was last seen in heart failure clinic on June 30, at which time she was volume overloaded and continued to have dyspnea with limited activity.  Torsemide was increased to 80 mg twice daily x3 days and then dropped to 60 mg twice daily.  Blood pressure was elevated and her midodrine dose was reduced to 10 mg 3 times daily.  Her BNP that  day was mildly elevated at 157.5.  Creatinine was stable at 1.25.  Unfortunately, since her last visit, she has continued to have dyspnea with minimal activity.  She is chronic, stable left lower extremity swelling and has not had any right lower extremity edema.  She feels that her abdomen is bloated however and that she is still retaining fluid.  She has not been noticing any significant change in urine output despite titration of torsemide.  Her weight today is 193 pounds whereas she had been trending in the low to mid 180s in the spring.  She denies chest pain, palpitations, PND, syncope, or early satiety.  She has chronic orthopnea.  She has been experiencing intermittent lightheadedness and dizziness dating back to even prior to her January hospitalization, for which she is pending a neurology evaluation.  Lightheaded episodes occur randomly at least once an hour, generally  while sitting and will pass within seconds to minutes.  She was previously evaluated by ENT and subsequently referred to neurology.  She has not had any significant arrhythmias detected on device monitoring in June.  Home Medications    Current Outpatient Medications  Medication Sig Dispense Refill   acetaminophen (TYLENOL) 325 MG tablet Take 325-650 mg by mouth every 6 (six) hours as needed for mild pain.     albuterol (ACCUNEB) 0.63 MG/3ML nebulizer solution Take 1 ampule by nebulization every 6 (six) hours as needed for wheezing.     amiodarone (PACERONE) 200 MG tablet Take 1 tablet (200 mg total) by mouth daily. 30 tablet 6   apixaban (ELIQUIS) 5 MG TABS tablet TAKE 1 TABLET BY MOUTH TWICE DAILY. APPOINTMENT NEEDED FOR FURTHER REFILLS. 180 tablet 0   benzonatate (TESSALON) 200 MG capsule Take 1 capsule (200 mg total) by mouth 3 (three) times daily as needed for cough. 20 capsule 0   cefadroxil (DURICEF) 500 MG capsule Take 1 capsule (500 mg total) by mouth 2 (two) times daily. 180 capsule 3   Difluprednate 0.05 % EMUL Place  1 drop into both eyes 2 (two) times daily.     DULoxetine (CYMBALTA) 30 MG capsule Take 60 mg by mouth daily.     macitentan (OPSUMIT) 10 MG tablet Take 1 tablet (10 mg total) by mouth daily. 90 tablet 3   meclizine (ANTIVERT) 25 MG tablet Take 1 tablet (25 mg total) by mouth 3 (three) times daily as needed for dizziness. 30 tablet 1   Menthol, Topical Analgesic, (BIOFREEZE EX) Apply 1 application topically 4 (four) times daily as needed (for pain).     Potassium Chloride (KLOR-CON PO) Take 20 mEq by mouth 2 (two) times daily.     rosuvastatin (CRESTOR) 10 MG tablet Take 1 tablet (10 mg total) by mouth daily. 90 tablet 3   Selexipag (UPTRAVI) 1600 MCG TABS Take 1 tablet (1,600 mcg total) by mouth 2 (two) times daily. 60 tablet 11   torsemide (DEMADEX) 20 MG tablet Take 3 tablets (60 mg total) by mouth 2 (two) times daily. Patient takes 3 tablets by mouth twice a day. 180 tablet 6   gabapentin (NEURONTIN) 300 MG capsule Take 2 capsules (600 mg total) by mouth 3 (three) times daily. (Patient taking differently: Take 300 mg by mouth 3 (three) times daily. Taking 600 mg in am, noon and 900 mg at night) 540 capsule 1   No current facility-administered medications for this visit.     Review of Systems    Ongoing dyspnea on exertion with limited activity.  Chronic left lower extremity swelling.  She notes increasing abdominal girth/bloating.  She has been experiencing lightheaded/dizzy episodes since prior to her January admission and is pending neurology evaluation.  She has some degree of chronic orthopnea.  She denies chest pain, palpitations, PND, syncope, or early satiety.  All other systems reviewed and are otherwise negative except as noted above.    Physical Exam    VS:  BP (!) 140/58   Pulse 60   Ht 5' (1.524 m)   Wt 193 lb 9.6 oz (87.8 kg)   SpO2 94%   BMI 37.81 kg/m  , BMI Body mass index is 37.81 kg/m.     GEN: Well nourished, well developed, in no acute distress. HEENT:  normal. Neck: Supple, JVP approximately 12 cm, no carotid bruits, or masses. Cardiac: RRR, 2/6 systolic murmur at the upper sternal borders and left lower sternal border.  No murmurs or gallops.  No clubbing, cyanosis, 2+ left lower extremity/pedal edema, without edema on the right.  Radials/PT 2+ and equal bilaterally.  Respiratory:  Respirations regular and unlabored, clear to auscultation bilaterally. GI: Soft, nontender, nondistended, BS + x 4. MS: no deformity or atrophy. Skin: warm and dry, no rash. Neuro:  Strength and sensation are intact. Psych: Normal affect.  Accessory Clinical Findings    ECG personally reviewed by me today -AV paced, 60- no acute changes.  Lab Results  Component Value Date   WBC 4.6 12/02/2021   HGB 9.8 (L) 12/02/2021   HCT 32.5 (L) 12/02/2021   MCV 91.0 12/02/2021   PLT 197 12/02/2021   Lab Results  Component Value Date   CREATININE 1.10 (H) 01/07/2022   BUN 22 01/07/2022   NA 141 01/07/2022   K 3.9 01/07/2022   CL 104 01/07/2022   CO2 30 01/07/2022   Lab Results  Component Value Date   ALT 10 11/25/2021   AST 12 11/25/2021   ALKPHOS 93 09/26/2021   BILITOT 0.4 11/25/2021   Lab Results  Component Value Date   CHOL 89 06/26/2021   HDL <10 (L) 06/26/2021   LDLCALC NOT CALCULATED 06/26/2021   TRIG 132 06/26/2021   CHOLHDL NOT CALCULATED 06/26/2021    Lab Results  Component Value Date   HGBA1C 5.4 06/25/2021   Lab Results  Component Value Date   TSH 0.463 09/26/2021    Assessment & Plan    1.  Acute on chronic heart failure with preserved ejection fraction/pulmonary hypertension: Most recent echo in March 2023 with an EF of 60 to 15%, grade 1 diastolic dysfunction, and RVSP of 40.3 mmHg.  Over the past few months, patient has required titration of torsemide therapy initially from 20 twice daily to 40 twice daily, and now 60 twice daily.  Despite titration, she has not noticed any significant change in urine output or reduction in  weight/improvement in symptoms.  Her weight remains 8 to 10 pounds above her weights in March/April and she continues to note significant dyspnea with limited activity.  She has chronic left lower extremity/pedal edema and only mild JVD however, she complains of abdominal bloating.  Abdomen is soft on examination.  Lab work today shows stable renal function and electrolytes.  I am going to add metolazone 2.5 mg on Mondays, Wednesdays, and Fridays with plan for follow-up basic metabolic panel next week.  She will otherwise continue torsemide 60 mg twice daily.  Upon review of her medications, I also noted that she is not currently taking sildenafil.  She is not sure when she stopped taking it but it fell off of her list on June 30, though she believes she stopped taking it prior to then.  As her blood pressure is elevated today I am going to discontinue midodrine and provided that pressure is stable, hopefully revaluate, resumed when she follows up at heart failure clinic in early August.  She otherwise remains on Opsumit and [place].  2.  Persistent atrial fibrillation: Patient required TEE and cardioversion January 2023 in the setting of recurrent A-fib while hospitalized for SBE.  She remains on amiodarone 2 mg daily and is maintaining sinus rhythm.  She is anticoagulated with Eliquis.  Normal LFTs in June with normal TSH in April.  3.  Essential hypertension/hypotension: Blood pressure elevated in the setting of ongoing midodrine therapy.  I am going to discontinue this today.  4.  Dizziness/lightheadedness: This has been going on since  prior to her hospitalization in January, with sudden lightheadedness occurring 1-2 times an hour, lasting a few minutes, resolving spontaneously.  Symptoms typically occur while sitting.  She has been seen by ENT with recommendation for neurology evaluation.  Appointment is pending.  Of note, June interrogation of pacemaker did not show any significant arrhythmias or  high-grade alarms, and did show normal device function.  5.  SBE: Hospitalized in January with endocarditis/group G strep bacteremia.  RV pacing wire and tricuspid valve with vegetations along with septic emboli to the lungs.  Seen by EP and felt to be too high risk for device extraction.  Remains on oral antibiotics for lifetime suppression and is followed by infectious disease.  6.  History of DVT: Chronically anticoagulated in the setting of above.  7.  Obstructive sleep apnea: Compliant with CPAP.  8.  Chronic left lower extremity lymphedema: Stable swelling per patient.  9.  Chronic hypoxemic respiratory failure/COPD/interstitial lung disease: On chronic home O2.  CPAP at bedtime.  10.  Disposition: Follow-up basic metabolic panel in 1 week.  Follow-up in heart failure clinic as planned in early August.  Murray Hodgkins, NP 01/07/2022, 6:16 PM

## 2022-01-07 NOTE — Patient Instructions (Signed)
Medication Instructions:   Your physician has recommended you make the following change in your medication:    STOP taking Midodrine.   *If you need a refill on your cardiac medications before your next appointment, please call your pharmacy*   Lab Work:  Get lab drawn as ordered by [facility].    Follow-Up: At [facility], you and your health needs are our priority.  As part of our continuing mission to provide you with exceptional heart care, we have created designated Provider Care Teams.  These Care Teams include your primary Cardiologist (physician) and Advanced Practice Providers (APPs -  Physician Assistants and Nurse Practitioners) who all work together to provide you with the care you need, when you need it.  We recommend signing up for the patient portal called "MyChart".  Sign up information is provided on this After Visit Summary.  MyChart is used to connect with patients for Virtual Visits (Telemedicine).  Patients are able to view lab/test results, encounter notes, upcoming appointments, etc.  Non-urgent messages can be sent to your provider as well.   To learn more about what you can do with MyChart, go to [email or web address].    Your next appointment:   Follow up as scheduled with Heart Failure Clinic     Important Information About [place]

## 2022-01-08 ENCOUNTER — Telehealth: Payer: Self-pay

## 2022-01-08 DIAGNOSIS — I2721 Secondary pulmonary arterial hypertension: Secondary | ICD-10-CM

## 2022-01-08 DIAGNOSIS — I4819 Other persistent atrial fibrillation: Secondary | ICD-10-CM

## 2022-01-08 DIAGNOSIS — I5032 Chronic diastolic (congestive) heart failure: Secondary | ICD-10-CM

## 2022-01-08 DIAGNOSIS — I5033 Acute on chronic diastolic (congestive) heart failure: Secondary | ICD-10-CM

## 2022-01-08 NOTE — Telephone Encounter (Signed)
-----   Message from Theora Gianotti, NP sent at 01/07/2022  5:32 PM EDT ----- Kidney function and electrolytes are stable.  In that setting, as we discussed in clinic today, I would like her to start taking metolazone 2.5 mg in the mornings on Mondays, Wednesdays, and Fridays with plan for follow-up basic metabolic panel in 1 week.  Otherwise continue torsemide 60 mg twice daily.

## 2022-01-08 NOTE — Telephone Encounter (Signed)
Left message for pt to call back  °

## 2022-01-09 MED ORDER — METOLAZONE 2.5 MG PO TABS: 2.5000 mg | ORAL_TABLET | ORAL | 2 refills | Status: DC

## 2022-01-09 NOTE — Telephone Encounter (Signed)
Reviewed results and recommendations with patient. Advised that I would send her my chart with these instructions as well. She verbalized understanding with no further questions at this time.

## 2022-01-09 NOTE — Telephone Encounter (Signed)
Pt is returning call.  

## 2022-01-14 ENCOUNTER — Other Ambulatory Visit: Payer: Self-pay

## 2022-01-14 DIAGNOSIS — I5020 Unspecified systolic (congestive) heart failure: Secondary | ICD-10-CM

## 2022-01-14 DIAGNOSIS — I5030 Unspecified diastolic (congestive) heart failure: Secondary | ICD-10-CM

## 2022-01-14 NOTE — Patient Outreach (Signed)
[facility]) Care Management  01/14/2022  Debra Saunders 05-Jun-1946 [id number]   Telephone Screen   Successful outreach call to patient to introduce [facility] services and assess care needs as part of benefit of PCP office and insurance plan. Patient resides in the home alone with supportive daughter who lives nearby.   Main healthcare issues concern today: transportation-has exhausted all rides through [facility] and misses many MD appts as daughter works and not always able to get off. Agreeable to care guide referral for transportation resources.   Conditions: Per chart review, patient has PMH that includes but not limited to HF, CAD, A-fib, COPD, HTN, DVT and IVC filter.   Medications Reviewed Today     Reviewed by Hayden Pedro, RN (Registered Nurse) on 01/14/22 at [place] List Status: <None>   Medication Order Taking? Sig Documenting Provider Last Dose Status Informant  acetaminophen (TYLENOL) 325 MG tablet [id number] No Take 325-650 mg by mouth every 6 (six) hours as needed for mild pain. [provider] Taking Active Self, Other, Pharmacy Records           Med Note Gilman Buttner, Juanita Laster   Tue Dec 25, 2020  3:51 PM)    albuterol (ACCUNEB) 0.63 MG/3ML nebulizer solution [id number] No Take 1 ampule by nebulization every 6 (six) hours as needed for wheezing. [provider] Taking Active Other, Self, Pharmacy Records  amiodarone (PACERONE) 200 MG tablet [id number] No Take 1 tablet (200 mg total) by mouth daily. Larey Dresser, MD Taking Active Self, Other, Pharmacy Records  apixaban [facility]) 5 MG TABS tablet [id number] No TAKE 1 TABLET BY MOUTH TWICE DAILY. APPOINTMENT NEEDED FOR FURTHER REFILLS. Larey Dresser, MD Taking Active   benzonatate (TESSALON) 200 MG capsule [id number] No Take 1 capsule (200 mg total) by mouth 3 (three) times daily as needed for cough. Mendel Corning, MD Taking Active Self, Other, Pharmacy Records  cefadroxil  (DURICEF) 500 MG capsule [id number] No Take 1 capsule (500 mg total) by mouth 2 (two) times daily. Jabier Mutton, MD Taking Active   Difluprednate 0.05 % EMUL [id number] No Place 1 drop into both eyes 2 (two) times daily. [provider] Taking Active Pharmacy Records, Self, Other  DULoxetine (CYMBALTA) 30 MG capsule [id number] No Take 60 mg by mouth daily. [provider] Taking Active   gabapentin (NEURONTIN) 300 MG capsule [id number] No Take 2 capsules (600 mg total) by mouth 3 (three) times daily.  Patient taking differently: Take 300 mg by mouth 3 (three) times daily. Taking 600 mg in am, noon and 900 mg at night   [name], MD Taking Expired 12/26/21 2359   macitentan (OPSUMIT) 10 MG tablet [id number] No Take 1 tablet (10 mg total) by mouth daily. Larey Dresser, MD Taking Active Self, Other, Pharmacy Records  meclizine (ANTIVERT) 25 MG tablet [id number] No Take 1 tablet (25 mg total) by mouth 3 (three) times daily as needed for dizziness. Larey Dresser, MD Taking Active Pharmacy Records, Other, Self  Menthol, Topical Analgesic, [facility] EX) [id number] No Apply 1 application topically 4 (four) times daily as needed (for pain). [provider] Taking Active Other, Self, Pharmacy Records  metolazone (ZAROXOLYN) 2.5 MG tablet [id number]  Take 1 tablet (2.5 mg total) by mouth every Monday, Wednesday, and Friday. Theora Gianotti, NP  Active   Potassium Chloride (KLOR-CON PO) [id number] No Take 20 mEq by mouth 2 (two) times daily. [provider] Taking Active  rosuvastatin (CRESTOR) 10 MG tablet [id number] No Take 1 tablet (10 mg total) by mouth daily. Larey Dresser, MD Taking Active   Selexipag (UPTRAVI) 1600 MCG TABS [id number] No Take 1 tablet (1,600 mcg total) by mouth 2 (two) times daily. Larey Dresser, MD Taking Active Self, Other, Pharmacy Records  torsemide [facility]) 20 MG tablet [id number] No Take 3 tablets (60 mg total) by mouth 2 (two)  times daily. Patient takes 3 tablets by mouth twice a day. Allena Katz [place], [place] Taking Active                08/27/2021    8:15 AM 09/25/2021   12:10 PM 11/25/2021    2:59 PM 12/02/2021    1:00 PM 01/14/2022    2:03 PM  Fall Risk  Falls in the past year?  0 1  1  Was there an injury with Fall?  0 0  0  Fall Risk Category Calculator  0 1  1  Fall Risk Category  Low Low  Low  Patient Fall Risk Level Moderate fall risk Low fall risk Moderate fall risk High fall risk Moderate fall risk  Patient at Risk for Falls Due to  No Fall Risks Impaired balance/gait;Impaired mobility;History of fall(s)  History of fall(s);Impaired balance/gait;Impaired mobility;Medication side effect  Patient at Risk for Falls Due to - Comments   In w/c    Fall risk Follow up  Falls evaluation completed Falls evaluation completed  Falls evaluation completed;Education provided       01/14/2022    1:54 PM 11/25/2021    2:59 PM 09/06/2021    3:46 PM 08/30/2021    9:58 AM 07/25/2021   10:10 AM  Depression screen PHQ 2/9  Decreased Interest 0 0 0 0 0  Down, Depressed, Hopeless 0 0 0 0 0  PHQ - 2 Score 0 0 0 0 0    SDOH Screenings   Alcohol Screen: Low Risk  (05/02/2021)   Alcohol Screen    Last Alcohol Screening Score (AUDIT): 0  Depression (PHQ2-9): Low Risk  (01/14/2022)   Depression (PHQ2-9)    PHQ-2 Score: 0  Financial Resource Strain: Medium Risk (07/04/2021)   Overall Financial Resource Strain (CARDIA)    Difficulty of Paying Living Expenses: Somewhat hard  Food Insecurity: No Food Insecurity (01/14/2022)   Hunger Vital Sign    Worried About Running Out of Food in the Last Year: Never true    Ran Out of Food in the Last Year: Never true  Housing: Low Risk  (07/04/2021)   Housing    Last Housing Risk Score: 0  Physical Activity: Inactive (01/05/2018)   Exercise Vital Sign    Days of Exercise per Week: 0 days    Minutes of Exercise per Session: 0 min  Social Connections: Socially Isolated (12/17/2020)    Social Connection and Isolation Panel [NHANES]    Frequency of Communication with Friends and Family: More than three times a week    Frequency of Social Gatherings with Friends and Family: Once a week    Attends Religious Services: Never    Marine scientist or Organizations: No    Attends Archivist Meetings: Never    Marital Status: Divorced  Stress: No Stress Concern Present (12/17/2020)   [employer] [place]    Feeling of Stress : Only a little  Tobacco Use: Medium Risk (01/14/2022)   Patient History    Smoking Tobacco Use: Former  Smokeless Tobacco Use: Never    Passive Exposure: Not on file  Transportation Needs: Unmet Transportation Needs (01/14/2022)   PRAPARE - Hydrologist (Medical): Yes    Lack of Transportation (Non-Medical): Yes    Care Plan : [place] of Care  Updates made by Hayden Pedro, RN since 01/14/2022 12:00 AM     Problem: Chronic Disease Management and Care Coordination Needs HTN   Priority: High     Long-Range Goal: Development of Plan of Care for HTN Completed 01/14/2022  Start Date: 05/02/2021  Expected End Date: 05/22/2022  Priority: High  Note:   Current Barriers:  Chronic Disease Management support and education needs related to HTN  RNCM Clinical Goal(s):  Patient will verbalize basic understanding of  HTN disease process and self health management plan   take all medications exactly as prescribed and will call provider for medication related questions through collaboration with RN Care manager, provider, and care team.   Interventions: Education provided on HTN management and diet Inter-disciplinary care team collaboration (see longitudinal plan of care) Evaluation of current treatment plan related to  self management and patient's adherence to plan as established by provider  Hypertension Interventions: Last practice  recorded BP readings:  BP Readings from Last 3 Encounters:  04/29/21 133/64  04/29/21 132/82  04/01/21 130/70  Most recent eGFR/CrCl: No results found for: EGFR  No components found for: CRCL  Evaluation of current treatment plan related to hypertension self management and patient's adherence to plan as established by provider; Discussed plans with patient for ongoing care management follow up and provided patient with direct contact information for care management team; Provided education on prescribed diet low salt diet;   Patient Goals/Self-Care Activities: Patient will self administer medications as prescribed Patient will attend all scheduled provider appointments Patient will continue to perform ADL's independently Take blood pressure at least weekly at home about one hour after medication and record in a notebook to take to doctor's visits.  Remain as active as possible Limit salt intake.  Follow Up Plan:  Telephone follow up appointment with care management team member scheduled for:  February The patient has been provided with contact information for the care management team and has been advised to call with any health related questions or concerns.   Resolving due to duplicate care plan and goals    Care Plan : RN Care Manager POC  Updates made by Hayden Pedro, RN since 01/14/2022 12:00 AM     Problem: Chronic Disease Mgmt of Chronic Condition-HF   Priority: High     Long-Range Goal: Development of POC for Mgmt of Chronic Condition-HF   Start Date: 01/14/2022  Expected End Date: 01/15/2023  Priority: High  Note:    Current Barriers:  Knowledge Deficits related to plan of care for management of CHF  Transportation barriers  RNCM Clinical Goal(s):  Patient will verbalize understanding of plan for management of CHF as evidenced by mgmt of chronic conditions  through collaboration with RN Care manager, provider, and care team.   Interventions: 1:1  collaboration with primary care provider regarding development and update of comprehensive plan of care as evidenced by provider attestation and co-signature Inter-disciplinary care team collaboration (see longitudinal plan of care) Evaluation of current treatment plan related to  self management and patient's adherence to plan as established by provider   Heart Failure Interventions:  (Status:  New goal.) Long Term Goal Provided education on low  sodium diet Discussed importance of daily weight and advised patient to weigh and record daily Reviewed role of diuretics in prevention of fluid overload and management of heart failure; Discussed the importance of keeping all appointments with provider Referral made to community resources care guide team for assistance with transportation;  Patient Goals/Self-Care Activities: Take all medications as prescribed Attend all scheduled provider appointments call office if I gain more than 2 pounds in one day or 5 pounds in one week keep legs up while sitting track weight in diary use salt in moderation watch for swelling in feet, ankles and legs every day  Follow Up Plan:  Telephone follow up appointment with care management team member scheduled for:  within the month of Sept         Consent:  [facility] services reviewed and discussed with patient. Verbal consent for services given.   Plan: RN CM discussed with patient next outreach within the month of Sept. Patient agrees to care plan and follow up. RN CM will send barriers letter and route encounter to PCP. RN CM will send welcome letter to patient.  Enzo Montgomery, RN,BSN,CCM [place] Management Telephonic Care Management Coordinator Direct Phone: [phone] Toll Free: [phone] Fax: [phone]

## 2022-01-15 ENCOUNTER — Other Ambulatory Visit (HOSPITAL_COMMUNITY): Payer: Self-pay

## 2022-01-15 ENCOUNTER — Telehealth: Payer: Self-pay

## 2022-01-15 ENCOUNTER — Other Ambulatory Visit (HOSPITAL_COMMUNITY): Payer: Self-pay | Admitting: *Deleted

## 2022-01-15 MED ORDER — MECLIZINE HCL 25 MG PO TABS
25.0000 mg | ORAL_TABLET | Freq: Three times a day (TID) | ORAL | 1 refills | Status: DC | PRN
Start: 2022-01-15 — End: 2024-05-03

## 2022-01-15 NOTE — Telephone Encounter (Signed)
   Telephone encounter was:  Unsuccessful.  01/15/2022 Name: Debra Saunders MRN: [id number] DOB: 1946/03/02  Unsuccessful outbound call made today to assist with:  Transportation Needs   Outreach Attempt:  2nd Attempt  A HIPAA compliant voice message was left requesting a return call.  Instructed patient to call back at [phone].  Left message on home voicemail to inform patient of her pick up time on 01/22/22  12:59pm. Also gave my name and number to return call if any questions.   Junella Domke, AAS Paralegal, [place] [facility]  [address],  [address] ??[email or web address]  ?? [phone]   [email or web address]

## 2022-01-15 NOTE — Telephone Encounter (Signed)
Telephone encounter was:  Successful.  01/15/2022 Name: HOUDA BRAU MRN: [id number] DOB: 23-May-1946  JEANEANE ADAMEC is a 76 y.o. year old female who is a primary care patient of [name], [name], MD . The community resource team was consulted for assistance with Transportation Needs   Care guide performed the following interventions: Spoke with patient about transportation. Patient stated that her daughter would provide transportation for her 01/28/22 appointment, but she would need transportation for 01/22/22 1:30pm appointment Luella Cook [facility] Neurology Department.  Scheduled transportation through [employer], ride is scheduled with [place], ride ID [phone], pickup time 12:59pm.  Also spoke with patient about Mariana Kaufman, I will mail the application and brochure to her home address.  Follow Up Plan:   I will call the patient to inform her of the pickup time and rider id.  Tobe Kervin, AAS Paralegal, [place] Management  [address], [place] [address] ??[email or web address]  ?? [phone]   [email or web address]  01/15/2022  [name] DOB: 12-21-1945 MRN: [id number]   RIDER WAIVER AND RELEASE OF LIABILITY  For purposes of improving physical access to our facilities, [place] is pleased to partner with third parties to provide [place] patients or other authorized individuals the option of convenient, on-demand ground transportation services (the Technical brewer") through use of the technology service that enables users to request on-demand ground transportation from independent third-party providers.  By opting to use and accept these [employer], I, the undersigned, hereby agree on behalf of myself, and on behalf of any minor child using the Government social research officer for whom I am the parent or legal guardian, as follows:  Government social research officer provided to me are  provided by independent third-party transportation providers who are not [employer] or employees and who are unaffiliated with [employer]. [place] is neither a transportation carrier nor a common or public carrier. [place] has no control over the quality or safety of the transportation that occurs as a result of the [employer]. [place] cannot guarantee that any third-party transportation provider will complete any arranged transportation service. [place] makes no representation, warranty, or guarantee regarding the reliability, timeliness, quality, safety, suitability, or availability of any of the Transport Services or that they will be error free. I fully understand that traveling by vehicle involves risks and dangers of serious bodily injury, including permanent disability, paralysis, and death. I agree, on behalf of myself and on behalf of any minor child using the Transport Services for whom I am the parent or legal guardian, that the entire risk arising out of my use of the [employer] remains solely with me, to the maximum extent permitted under applicable law. The [employer] are provided "as is" and "as available." [place] disclaims all representations and warranties, express, implied or statutory, not expressly set out in these terms, including the implied warranties of merchantability and fitness for a particular purpose. I hereby waive and release [place], its agents, employees, officers, directors, representatives, insurers, attorneys, assigns, successors, subsidiaries, and affiliates from any and all past, present, or future claims, demands, liabilities, actions, causes of action, or suits of any kind directly or indirectly arising from acceptance and use of the [employer]. I further waive and release [place] and its affiliates from all present and future liability and responsibility for any injury or death to persons or damages  to property  caused by or related to the use of the [employer]. I have read this Waiver and Release of Liability, and I understand the terms used in it and their legal significance. This Waiver is freely and voluntarily given with the understanding that my right (as well as the right of any minor child for whom I am the parent or legal guardian using the [employer]) to legal recourse against [place] in connection with the [employer] is knowingly surrendered in return for use of these services.   I attest that I read the Ride Waiver and Release of Liability to [name], gave Ms. Casamento the opportunity to ask questions and answered the questions asked (if any). I affirm that [name] then provided consent for assistance with transportation.     Tashia Leiterman D Andree Elk

## 2022-01-16 ENCOUNTER — Telehealth: Payer: Self-pay

## 2022-01-16 ENCOUNTER — Other Ambulatory Visit (HOSPITAL_COMMUNITY): Payer: Self-pay | Admitting: *Deleted

## 2022-01-16 MED ORDER — SILDENAFIL CITRATE 20 MG PO TABS: 20.0000 mg | ORAL_TABLET | Freq: Three times a day (TID) | ORAL | 0 refills | Status: DC

## 2022-01-16 NOTE — Telephone Encounter (Signed)
   Telephone encounter was:  Unsuccessful.  01/16/2022 Name: Debra Saunders MRN: [id number] DOB: [birth year]  Unsuccessful outbound call made today to assist with:  Transportation Needs   Outreach Attempt:  3rd Attempt.  Referral closed unable to contact patient.  A HIPAA compliant voice message was left requesting a return call.  Instructed patient to call back at [phone]. Left message on voicemail for the patient to return my call regarding 12:59pm pick up time for 01/22/22 appointment at [facility]. Also informed patient she would need to sign a rider waiver form.     Lauralynn Loeb, AAS Paralegal, [place] Management  [address], [place] [address] ??[email or web address]  ?? [phone]   [email or web address]

## 2022-01-16 NOTE — Progress Notes (Signed)
Had a home visit with Debra Saunders today.  She states her eyes are bothering her today.  She has been to eye doctor and they states she is ok.  Called and made her neurology appt for her, she is aware of Aug 2 appt now and her daughter is aware.  She is taking her medication and she has started the metolazone.  There is a question if she is still taking sildinifil, cannot find a note that is was stopped, not on her med list no more.  Contacted HF clinic and left message.  She has lost a few lbs, she states not urinating more since metolazone was started.  She is back sleeping late in the day and going to sleep late.  She states does not eat much during the day.  She eats healthy ow salt foods.  She is drinking around 50 ounces a day.  She denies any chest pain, headaches, or increased shortness of breath. Fixed her med boxes for 2 weeks.  Called in a couples prescriptions that needed refilled to her local pharmacy.  Will continue to visit for heart failure, diet and medication management   [place] [phone]

## 2022-01-16 NOTE — Telephone Encounter (Signed)
   Telephone encounter was:  Successful.  01/16/2022 Name: Debra Saunders MRN: [id number] DOB: 1946-02-06  Debra Saunders is a 76 y.o. year old female who is a primary care patient of [name], [name], MD . The community resource team was consulted for assistance with Transportation Needs   Care guide performed the following interventions: Spoke with Leda Roys at [facility]rology [facility]. regarding faxing a copy of the ride waiver to the office for the patient to sign at 01/22/22 appointment. Received confirmation of fax sent via email.   Follow Up Plan:   I will attempt to call patient today.  Tania Perrott, AAS Paralegal, [place] Management  [address],  [address] ??[email or web address]  ?? [phone]   [email or web address]

## 2022-01-22 DIAGNOSIS — R42 Dizziness and giddiness: Secondary | ICD-10-CM | POA: Diagnosis not present

## 2022-01-22 DIAGNOSIS — H539 Unspecified visual disturbance: Secondary | ICD-10-CM | POA: Diagnosis not present

## 2022-01-22 DIAGNOSIS — Z7689 Persons encountering health services in other specified circumstances: Secondary | ICD-10-CM | POA: Diagnosis not present

## 2022-01-22 DIAGNOSIS — R200 Anesthesia of skin: Secondary | ICD-10-CM | POA: Diagnosis not present

## 2022-01-22 DIAGNOSIS — R251 Tremor, unspecified: Secondary | ICD-10-CM | POA: Diagnosis not present

## 2022-01-28 ENCOUNTER — Other Ambulatory Visit (HOSPITAL_COMMUNITY): Payer: Self-pay

## 2022-01-28 ENCOUNTER — Ambulatory Visit (HOSPITAL_COMMUNITY)
Admission: RE | Admit: 2022-01-28 | Discharge: 2022-01-28 | Disposition: A | Discharge status: Home / Self Care | Payer: Medicare HMO | Source: Ambulatory Visit | Attending: Cardiology | Admitting: Cardiology

## 2022-01-28 ENCOUNTER — Encounter (HOSPITAL_COMMUNITY): Payer: Self-pay | Admitting: Cardiology

## 2022-01-28 VITALS — BP 118/70 | HR 60 | Wt 193.6 lb

## 2022-01-28 DIAGNOSIS — Z7901 Long term (current) use of anticoagulants: Secondary | ICD-10-CM | POA: Diagnosis not present

## 2022-01-28 DIAGNOSIS — I340 Nonrheumatic mitral (valve) insufficiency: Secondary | ICD-10-CM | POA: Insufficient documentation

## 2022-01-28 DIAGNOSIS — R42 Dizziness and giddiness: Secondary | ICD-10-CM | POA: Diagnosis not present

## 2022-01-28 DIAGNOSIS — I890 Lymphedema, not elsewhere classified: Secondary | ICD-10-CM | POA: Diagnosis not present

## 2022-01-28 DIAGNOSIS — I2510 Atherosclerotic heart disease of native coronary artery without angina pectoris: Secondary | ICD-10-CM | POA: Diagnosis not present

## 2022-01-28 DIAGNOSIS — I272 Pulmonary hypertension, unspecified: Secondary | ICD-10-CM

## 2022-01-28 DIAGNOSIS — J8410 Pulmonary fibrosis, unspecified: Secondary | ICD-10-CM | POA: Diagnosis not present

## 2022-01-28 DIAGNOSIS — J9611 Chronic respiratory failure with hypoxia: Secondary | ICD-10-CM | POA: Diagnosis not present

## 2022-01-28 DIAGNOSIS — G4733 Obstructive sleep apnea (adult) (pediatric): Secondary | ICD-10-CM | POA: Diagnosis not present

## 2022-01-28 DIAGNOSIS — Z79899 Other long term (current) drug therapy: Secondary | ICD-10-CM | POA: Diagnosis not present

## 2022-01-28 DIAGNOSIS — I130 Hypertensive heart and chronic kidney disease with heart failure and stage 1 through stage 4 chronic kidney disease, or unspecified chronic kidney disease: Secondary | ICD-10-CM | POA: Diagnosis not present

## 2022-01-28 DIAGNOSIS — D649 Anemia, unspecified: Secondary | ICD-10-CM | POA: Insufficient documentation

## 2022-01-28 DIAGNOSIS — J449 Chronic obstructive pulmonary disease, unspecified: Secondary | ICD-10-CM | POA: Insufficient documentation

## 2022-01-28 DIAGNOSIS — I951 Orthostatic hypotension: Secondary | ICD-10-CM | POA: Insufficient documentation

## 2022-01-28 DIAGNOSIS — Z86718 Personal history of other venous thrombosis and embolism: Secondary | ICD-10-CM | POA: Insufficient documentation

## 2022-01-28 DIAGNOSIS — I480 Paroxysmal atrial fibrillation: Secondary | ICD-10-CM | POA: Insufficient documentation

## 2022-01-28 DIAGNOSIS — Z8673 Personal history of transient ischemic attack (TIA), and cerebral infarction without residual deficits: Secondary | ICD-10-CM | POA: Insufficient documentation

## 2022-01-28 DIAGNOSIS — Z950 Presence of cardiac pacemaker: Secondary | ICD-10-CM | POA: Insufficient documentation

## 2022-01-28 DIAGNOSIS — I495 Sick sinus syndrome: Secondary | ICD-10-CM | POA: Insufficient documentation

## 2022-01-28 DIAGNOSIS — I2721 Secondary pulmonary arterial hypertension: Secondary | ICD-10-CM | POA: Diagnosis not present

## 2022-01-28 DIAGNOSIS — Z9981 Dependence on supplemental oxygen: Secondary | ICD-10-CM | POA: Insufficient documentation

## 2022-01-28 DIAGNOSIS — I5032 Chronic diastolic (congestive) heart failure: Secondary | ICD-10-CM

## 2022-01-28 DIAGNOSIS — E1122 Type 2 diabetes mellitus with diabetic chronic kidney disease: Secondary | ICD-10-CM | POA: Insufficient documentation

## 2022-01-28 DIAGNOSIS — I5033 Acute on chronic diastolic (congestive) heart failure: Secondary | ICD-10-CM | POA: Insufficient documentation

## 2022-01-28 LAB — COMPREHENSIVE METABOLIC PANEL
ALT: 12 U/L (ref 0–44)
AST: 13 U/L — ABNORMAL LOW (ref 15–41)
Albumin: 3.8 g/dL (ref 3.5–5.0)
Alkaline Phosphatase: 97 U/L (ref 38–126)
Anion gap: 9 (ref 5–15)
BUN: 59 mg/dL — ABNORMAL HIGH (ref 8–23)
CO2: 36 mmol/L — ABNORMAL HIGH (ref 22–32)
Calcium: 9.1 mg/dL (ref 8.9–10.3)
Chloride: 95 mmol/L — ABNORMAL LOW (ref 98–111)
Creatinine, Ser: 1.66 mg/dL — ABNORMAL HIGH (ref 0.44–1.00)
GFR, Estimated: 31 mL/min — ABNORMAL LOW (ref >60)
Glucose, Bld: 106 mg/dL — ABNORMAL HIGH (ref 70–99)
Potassium: 3.2 mmol/L — ABNORMAL LOW (ref 3.5–5.1)
Sodium: 140 mmol/L (ref 135–145)
Total Bilirubin: 0.9 mg/dL (ref 0.3–1.2)
Total Protein: 7.1 g/dL (ref 6.5–8.1)

## 2022-01-28 LAB — TSH: TSH: 0.397 u[IU]/mL (ref 0.350–4.500)

## 2022-01-28 LAB — SEDIMENTATION RATE: Sed Rate: 61 mm/hr — ABNORMAL HIGH (ref 0–22)

## 2022-01-28 NOTE — H&P (View-Only) (Signed)
Date:  01/28/2022   ID:  Debra Saunders, DOB 1945/09/16, MRN [id number]   Provider location: [facility] Type of Visit: Established patient   PCP:  [name], MD  Cardiologist:  [name], MD Primary HF: Dr. [name]  Chief Complaint: post-hospital HF follow up   History of Present Illness: Debra Saunders is a 76 y.o. 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   Admitted 10/24-10/29/19 with acute on chronic diastolic HF. HF team consulted with concerns for low output. PICC was placed and milrinone to support RV function was started along with IV Lasix to facilitate diuresis. RHC done to evaluate PAH (see below). V/Q scan negative. High res CT showed pulmonary fibrosis. Pulmonary consult and recommended stopping amiodarone. She was transitioned to torsemide 60 mg BID. DC weight: 226 lbs.    She has started on selexipag for pulmonary hypertension in addition to Opsumit and Revatio.      Echo in 3/21 showed EF 60-65%, normal RV, PASP 50 mmHg, moderate-severe MR.   Had [place] 08/30/2020- low/normal filling pressures, mild pumonary htn, and preserved cardiac. output.   Admitted to [facility] 1/23 with endocarditis and PNA, requiring pressors. BCx positive for Streptococcus group G.  All her PAH meds were held. TEE showed a large vegetation on the pacer wire in the right atrium above the tricuspid valve (1 x 1.5 cm).  No vegetations noted on the valves.  EF was 55% and there was note of mild to moderate mitral regurgitation. RV ok. She was transferred to [facility] for pacemaker extraction. EP felt she was not a candidate due to multiple comorbidities. ID  consulted and planned for 6 weeks of IV abx. She developed Afib with RVR and started on amio. She underwent successful TEE-guided DCCV 07/14/21. Hospitalization complicated by anemia, hypotension, and AKI.  Midodrine started during admission. She was discharged to SNF, weight 198 lbs.  Admitted 3/23 with lightheadedness. Treated for sinus infection, HF meds held due to othostasis. Midodrine increased to 10 mg tid. HH PT arranged.  Repeat echo in 3/23 showed EF 60-65%, mild LVH, normal RV, IVC normal, no definite vegetation noted.   She returns for followup of CHF.  She is home-bound and remains very weak.  Uses wheelchair when she leaves the house.  Using 4L home oxygen and CPAP at night.  She feels like she never recovered from her hospitalization for endocarditis in 1/23.  She is on amiodarone and in NSR, no palpitations.  She has been off sildenafil but not sure why.  She still has it at home.  Weight up 6 lbs.  She is short of breath with "any activity."  Not short of breath at rest.   Uses walker around the house, short of breath walking in her house.  No orthopnea/PND with CPAP on.  She was recently started on metolazone three times a week but does not feel like this has helped her.   6 minute walk (1/20): 98 m 6 minute walk (7/20): 213 m 6 minute walk (3/22): 198 m 6 minute walk (10/22): 229 m   Labs (11/19): digoxin 0.8 Labs (1/20): K 4.2, creatinine 1.36 => 1.33, digoxin 0.8 Labs (2/20): digoxin  0.2, K 4.3, creatinine 1.32 Labs (5/20): K 4.5, creatinine 1.4, digoxin 0.8 Labs (1/22): K 4, creatinine 1.07 Labs (8/22): K 4.1, creatinine 1.03 Labs (1/23): K 3.7, creatinine 1.29 Labs (1/23): K 4.7, creatinine 1.07 Labs (3/23): K 4.3, creatinine 1.24 => 1.07, hgb 9.4 Labs (6/23): BNP 158 Labs (7/23): K 3.9, creatinine 1.1   PMH: 1. Lymphedema: Left lower leg (chronic).  2. COPD 3. 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  - PFTs (2017) with FEV1 64%, FEV1/FVC ratio  106%, TLC 49%, DLCO 39% (restrictive).  - CT chest high resolution (10/19): Pulmonary parenchymal pattern of fibrosis.  - CT chest (4/22): Chronic ILD 4. Atrial fibrillation: Paroxysmal.  She was previously off amiodarone due to lung disease.  - 2/19 DCCV.  - 1/23 DCCV 5. H/o DVT s/p IVC filter in 2004.  6. CAD: 4/19 coronary angiography with minimal coronary disease.  7. Bradycardia: [employer] PPM.  8. H/o traumatic SAH 2014.  9. CKD: Stage 3.  10. H/o CVA 11. OSA: CPAP 12. HTN 13. Type II diabetes 14. Pulmonary hypertension:  - PFTs 2017 with severe restriction.  - Echo (10/19): EF 60-65%, PASP 52 mmHg - RHC (4/19): Severe PAH with PVR 7.2 WU.  - RHC (10/19): mean RA 6, PA 56/17 mean 33, PCWP mean 12, CI 3.23, PVR 3.4 WU.  - RHC (3/22): mean 3, RV 30/1, PA 33/11, mean 20, PCWP mean 6, Oxygen saturations: PA 69% AO 100%, Cardiac Output (Fick) 6.18, Cardiac Index (Fick) 3.36, and PVR 2.3 WU - V/Q scan (10/19): No chronic PE.  - Anti-SCL 70 and ANA negative.  ACE normal.  RF slightly elevated at 15.  - Echo (3/21): EF 60-65%, normal RV, PASP 50 mmHg, moderate-severe MR. - TEE (1/23): EF 60-65%, mild RV dilation with normal RV systolic function, MR mild-moderate, moderate TR with vegetation on TV and pacemaker lead - Echo (1/23): EF 60-65%, normal RV, RVSP 51.5 mmHg, mild-moderate MR, moderate TR - Echo (3/23): EF 60-65%, mild LVH, normal RV, IVC normal, no definite vegetation noted.  FH: mother with CHF 69. 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  16. Mitral regurgitation: moderate to severe on 3/22 echo.  17. Zio patch (3/22): 9.3% PACs, no AF, 2.9% PVCs 18. 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  Deemed too high risk to remove device so have tried to treat through.   Current Outpatient Medications  Medication Sig Dispense Refill   acetaminophen (TYLENOL) 325 MG tablet Take 325-650 mg by mouth every 6 (six) hours as needed for  mild pain.     albuterol (ACCUNEB) 0.63 MG/3ML nebulizer solution Take 1 ampule by nebulization every 6 (six) hours as needed for wheezing.     amiodarone (PACERONE) 200 MG tablet Take 1 tablet (200 mg total) by mouth daily. 30 tablet 6   apixaban (ELIQUIS) 5 MG TABS tablet TAKE 1 TABLET BY MOUTH TWICE DAILY. APPOINTMENT NEEDED FOR FURTHER REFILLS. 180 tablet 0   benzonatate (TESSALON) 200 MG capsule Take 1 capsule (200 mg total) by mouth 3 (three) times daily as needed for cough. 20 capsule 0   cefadroxil (DURICEF) 500 MG capsule Take 1 capsule (500 mg total) by mouth 2 (two) times daily. 180 capsule 3   Difluprednate 0.05 % EMUL Place 1 drop into both eyes 2 (two) times daily.     DULoxetine (CYMBALTA) 30 MG capsule Take 60 mg by mouth daily.     gabapentin (NEURONTIN) 300 MG capsule Take  2 capsules (600 mg total) by mouth 3 (three) times daily. 540 capsule 1   macitentan (OPSUMIT) 10 MG tablet Take 1 tablet (10 mg total) by mouth daily. 90 tablet 3   meclizine (ANTIVERT) 25 MG tablet Take 1 tablet (25 mg total) by mouth 3 (three) times daily as needed for dizziness. 30 tablet 1   Menthol, Topical Analgesic, (BIOFREEZE EX) Apply 1 application topically 4 (four) times daily as needed (for pain).     metolazone (ZAROXOLYN) 2.5 MG tablet Take 1 tablet (2.5 mg total) by mouth every Monday, Wednesday, and Friday. 12 tablet 2   potassium chloride SA (KLOR-CON M) 20 MEQ tablet Take 40 mEq by mouth 2 (two) times daily.     rosuvastatin (CRESTOR) 10 MG tablet Take 1 tablet (10 mg total) by mouth daily. 90 tablet 3   Selexipag (UPTRAVI) 1600 MCG TABS Take 1 tablet (1,600 mcg total) by mouth 2 (two) times daily. 60 tablet 11   sildenafil (REVATIO) 20 MG tablet Take 1 tablet (20 mg total) by mouth 3 (three) times daily. 10 tablet 0   torsemide (DEMADEX) 20 MG tablet Take 3 tablets (60 mg total) by mouth 2 (two) times daily. Patient takes 3 tablets by mouth twice a day. 180 tablet 6   No current  facility-administered medications for this encounter.    Allergies:   Aspirin and Other   Social History:  The patient  reports that she quit smoking about 31 years ago. Her smoking use included cigarettes. She has a 0.60 pack-year smoking history. She has never used smokeless tobacco. She reports that she does not drink alcohol and does not use drugs.   Family History:  The patient's family history includes Bone cancer in her sister; Breast cancer in her maternal aunt and sister; COPD in her mother; Heart disease in her mother; Hypertension in her father, mother, sister, and sister; Thyroid disease in her mother, sister, and sister.   ROS:  Please see the history of present illness.   All other systems are personally reviewed and negative.   Physical Exam:   General: NAD Neck: No JVD, no thyromegaly or thyroid nodule.  Lungs: Dry crackles at bases.  CV: Nondisplaced PMI.  Heart regular S1/S2, no S3/S4, 2/6 HSM LLSB.  No peripheral edema.  No carotid bruit.  Normal pedal pulses.  Abdomen: Soft, nontender, no hepatosplenomegaly, no distention.  Skin: Intact without lesions or rashes.  Neurologic: Alert and oriented x 3.  Psych: Normal affect. Extremities: No clubbing or cyanosis. Left leg chronic lymphedema.  HEENT: Normal.   Recent Labs: 08/27/2021: Magnesium 2.5 12/02/2021: Hemoglobin 9.8; Platelets 197 12/20/2021: B Natriuretic Peptide 157.5 01/28/2022: ALT 12; BUN 59; Creatinine, Ser 1.66; Potassium 3.2; Sodium 140; TSH 0.397  Personally reviewed   Wt Readings from Last 3 Encounters:  01/28/22 87.8 kg (193 lb 9.6 oz)  01/07/22 87.8 kg (193 lb 9.6 oz)  12/20/21 85 kg (187 lb 6.4 oz)    BP 118/70   Pulse 60   Wt 87.8 kg (193 lb 9.6 oz)   SpO2 98% Comment: 4l n/c  BMI 37.81 kg/m   ASSESSMENT AND PLAN: 1. Atrial fibrillation with tachy-brady syndrome:  Had been off amiodarone since 2019 due to interstitial lung disease/pulmonary fibrosis. Amiodarone restarted during 1/23  admission. s/p TEE-guided DCCV 1/23. She is in NSR today.  - With worsening dyspnea and not clearly volume overloaded, I will stop amiodarone again.  Check ESR today.   - Continue Eliquis 5 mg bid.  2. 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 Echo in 3/23 showed EF 60-65%, mild LVH, normal RV, IVC normal, no definite vegetation noted. She is now off midodrine.  NYHA class IIIb symptoms, though she is not clearly volume overloaded on exam.  She is on high dose of diuretics without much improvement. It is possible that interstitial lung disease may be playing a large role in her current symptoms.   - I will arrange for RHC to assess filling pressures. If she is significantly volume overloaded, I will admit her for diuresis .  We discussed risks/benefits and she agreed to procedure.  - Continue torsemide 60 mg bid + metolazone 2.5 M/W/F.  BMET/BNP today.  - Consider SGLT2 inhibitor in future (after RHC depending on findings).  3. Pulmonary hypertension: Based on 4/19 cath, she had severe PAH with PVR 7.2 WU. Chronic hypoxemic respiratory failure, on home oxygen.  She has a history of remote DVT (2004) but V/Q scan negative for evidence of chronic PE.  High resolution CT chest in 10/19 showed pulmonary fibrosis, and PFTs in 2017 showed severe restriction.  Serological workup for rheumatological diseases was unremarkable.  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 Amiodarone was stopped in past given lung parenchymal disease then restarted. 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  - Continue macitentan 10 daily.  - Continue Selexipag 1600 bid.   - Restart sildenafil 20 mg tid.  - She cannot do  a 6 minute walk today.  - Home oxygen, CPAP.  - As above, stopping amiodarone in case it is playing a role in dyspnea.  4. Endocarditis: in 1/23.  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  Group G strep bacteremia.  Septic emboli to lungs. Seen by EP, thought to be too high risk for device extraction. Unfortunately she will be at high risk for complications going forward.  - She is now on lifetime suppression with cefadroxil.  5. Chronic hypoxemic respiratory failure:  At baseline, on 4L home oxygen with CPAP at night for interstitial lung disease and OSA.   - Continue home O2 4 L by  (baseline).  - Followup with pulmonary at [facility].  6. HTN: BP not elevated.  7. H/o DVT: Anticoagulated.  8. Left leg lymphedema: Chronic.  9. OSA: CPAP.  10. Orthostatic hypotension: This seems improved, she is off midodrine.    Signed, [name], MD  01/28/2022  [facility] [place] [address] [facility] [place] [address] [phone] (office) [phone] (fax)

## 2022-01-28 NOTE — Progress Notes (Addendum)
Date:  01/28/2022   ID:  Debra Saunders, DOB 1945/09/16, MRN [id number]   Provider location: [facility] Type of Visit: Established patient   PCP:  [name], MD  Cardiologist:  [name], MD Primary HF: Dr. [name]  Chief Complaint: post-hospital HF follow up   History of Present Illness: Debra Saunders is a 76 y.o. 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   Admitted 10/24-10/29/19 with acute on chronic diastolic HF. HF team consulted with concerns for low output. PICC was placed and milrinone to support RV function was started along with IV Lasix to facilitate diuresis. RHC done to evaluate PAH (see below). V/Q scan negative. High res CT showed pulmonary fibrosis. Pulmonary consult and recommended stopping amiodarone. She was transitioned to torsemide 60 mg BID. DC weight: 226 lbs.    She has started on selexipag for pulmonary hypertension in addition to Opsumit and Revatio.      Echo in 3/21 showed EF 60-65%, normal RV, PASP 50 mmHg, moderate-severe MR.   Had [place] 08/30/2020- low/normal filling pressures, mild pumonary htn, and preserved cardiac. output.   Admitted to [facility] 1/23 with endocarditis and PNA, requiring pressors. BCx positive for Streptococcus group G.  All her PAH meds were held. TEE showed a large vegetation on the pacer wire in the right atrium above the tricuspid valve (1 x 1.5 cm).  No vegetations noted on the valves.  EF was 55% and there was note of mild to moderate mitral regurgitation. RV ok. She was transferred to [facility] for pacemaker extraction. EP felt she was not a candidate due to multiple comorbidities. ID  consulted and planned for 6 weeks of IV abx. She developed Afib with RVR and started on amio. She underwent successful TEE-guided DCCV 07/14/21. Hospitalization complicated by anemia, hypotension, and AKI.  Midodrine started during admission. She was discharged to SNF, weight 198 lbs.  Admitted 3/23 with lightheadedness. Treated for sinus infection, HF meds held due to othostasis. Midodrine increased to 10 mg tid. HH PT arranged.  Repeat echo in 3/23 showed EF 60-65%, mild LVH, normal RV, IVC normal, no definite vegetation noted.   She returns for followup of CHF.  She is home-bound and remains very weak.  Uses wheelchair when she leaves the house.  Using 4L home oxygen and CPAP at night.  She feels like she never recovered from her hospitalization for endocarditis in 1/23.  She is on amiodarone and in NSR, no palpitations.  She has been off sildenafil but not sure why.  She still has it at home.  Weight up 6 lbs.  She is short of breath with "any activity."  Not short of breath at rest.   Uses walker around the house, short of breath walking in her house.  No orthopnea/PND with CPAP on.  She was recently started on metolazone three times a week but does not feel like this has helped her.   6 minute walk (1/20): 98 m 6 minute walk (7/20): 213 m 6 minute walk (3/22): 198 m 6 minute walk (10/22): 229 m   Labs (11/19): digoxin 0.8 Labs (1/20): K 4.2, creatinine 1.36 => 1.33, digoxin 0.8 Labs (2/20): digoxin  0.2, K 4.3, creatinine 1.32 Labs (5/20): K 4.5, creatinine 1.4, digoxin 0.8 Labs (1/22): K 4, creatinine 1.07 Labs (8/22): K 4.1, creatinine 1.03 Labs (1/23): K 3.7, creatinine 1.29 Labs (1/23): K 4.7, creatinine 1.07 Labs (3/23): K 4.3, creatinine 1.24 => 1.07, hgb 9.4 Labs (6/23): BNP 158 Labs (7/23): K 3.9, creatinine 1.1   PMH: 1. Lymphedema: Left lower leg (chronic).  2. COPD 3. 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  - PFTs (2017) with FEV1 64%, FEV1/FVC ratio  106%, TLC 49%, DLCO 39% (restrictive).  - CT chest high resolution (10/19): Pulmonary parenchymal pattern of fibrosis.  - CT chest (4/22): Chronic ILD 4. Atrial fibrillation: Paroxysmal.  She was previously off amiodarone due to lung disease.  - 2/19 DCCV.  - 1/23 DCCV 5. H/o DVT s/p IVC filter in 2004.  6. CAD: 4/19 coronary angiography with minimal coronary disease.  7. Bradycardia: [employer] PPM.  8. H/o traumatic SAH 2014.  9. CKD: Stage 3.  10. H/o CVA 11. OSA: CPAP 12. HTN 13. Type II diabetes 14. Pulmonary hypertension:  - PFTs 2017 with severe restriction.  - Echo (10/19): EF 60-65%, PASP 52 mmHg - RHC (4/19): Severe PAH with PVR 7.2 WU.  - RHC (10/19): mean RA 6, PA 56/17 mean 33, PCWP mean 12, CI 3.23, PVR 3.4 WU.  - RHC (3/22): mean 3, RV 30/1, PA 33/11, mean 20, PCWP mean 6, Oxygen saturations: PA 69% AO 100%, Cardiac Output (Fick) 6.18, Cardiac Index (Fick) 3.36, and PVR 2.3 WU - V/Q scan (10/19): No chronic PE.  - Anti-SCL 70 and ANA negative.  ACE normal.  RF slightly elevated at 15.  - Echo (3/21): EF 60-65%, normal RV, PASP 50 mmHg, moderate-severe MR. - TEE (1/23): EF 60-65%, mild RV dilation with normal RV systolic function, MR mild-moderate, moderate TR with vegetation on TV and pacemaker lead - Echo (1/23): EF 60-65%, normal RV, RVSP 51.5 mmHg, mild-moderate MR, moderate TR - Echo (3/23): EF 60-65%, mild LVH, normal RV, IVC normal, no definite vegetation noted.  FH: mother with CHF 69. 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  16. Mitral regurgitation: moderate to severe on 3/22 echo.  17. Zio patch (3/22): 9.3% PACs, no AF, 2.9% PVCs 18. 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  Deemed too high risk to remove device so have tried to treat through.   Current Outpatient Medications  Medication Sig Dispense Refill   acetaminophen (TYLENOL) 325 MG tablet Take 325-650 mg by mouth every 6 (six) hours as needed for  mild pain.     albuterol (ACCUNEB) 0.63 MG/3ML nebulizer solution Take 1 ampule by nebulization every 6 (six) hours as needed for wheezing.     amiodarone (PACERONE) 200 MG tablet Take 1 tablet (200 mg total) by mouth daily. 30 tablet 6   apixaban (ELIQUIS) 5 MG TABS tablet TAKE 1 TABLET BY MOUTH TWICE DAILY. APPOINTMENT NEEDED FOR FURTHER REFILLS. 180 tablet 0   benzonatate (TESSALON) 200 MG capsule Take 1 capsule (200 mg total) by mouth 3 (three) times daily as needed for cough. 20 capsule 0   cefadroxil (DURICEF) 500 MG capsule Take 1 capsule (500 mg total) by mouth 2 (two) times daily. 180 capsule 3   Difluprednate 0.05 % EMUL Place 1 drop into both eyes 2 (two) times daily.     DULoxetine (CYMBALTA) 30 MG capsule Take 60 mg by mouth daily.     gabapentin (NEURONTIN) 300 MG capsule Take  2 capsules (600 mg total) by mouth 3 (three) times daily. 540 capsule 1   macitentan (OPSUMIT) 10 MG tablet Take 1 tablet (10 mg total) by mouth daily. 90 tablet 3   meclizine (ANTIVERT) 25 MG tablet Take 1 tablet (25 mg total) by mouth 3 (three) times daily as needed for dizziness. 30 tablet 1   Menthol, Topical Analgesic, (BIOFREEZE EX) Apply 1 application topically 4 (four) times daily as needed (for pain).     metolazone (ZAROXOLYN) 2.5 MG tablet Take 1 tablet (2.5 mg total) by mouth every Monday, Wednesday, and Friday. 12 tablet 2   potassium chloride SA (KLOR-CON M) 20 MEQ tablet Take 40 mEq by mouth 2 (two) times daily.     rosuvastatin (CRESTOR) 10 MG tablet Take 1 tablet (10 mg total) by mouth daily. 90 tablet 3   Selexipag (UPTRAVI) 1600 MCG TABS Take 1 tablet (1,600 mcg total) by mouth 2 (two) times daily. 60 tablet 11   sildenafil (REVATIO) 20 MG tablet Take 1 tablet (20 mg total) by mouth 3 (three) times daily. 10 tablet 0   torsemide (DEMADEX) 20 MG tablet Take 3 tablets (60 mg total) by mouth 2 (two) times daily. Patient takes 3 tablets by mouth twice a day. 180 tablet 6   No current  facility-administered medications for this encounter.    Allergies:   Aspirin and Other   Social History:  The patient  reports that she quit smoking about 31 years ago. Her smoking use included cigarettes. She has a 0.60 pack-year smoking history. She has never used smokeless tobacco. She reports that she does not drink alcohol and does not use drugs.   Family History:  The patient's family history includes Bone cancer in her sister; Breast cancer in her maternal aunt and sister; COPD in her mother; Heart disease in her mother; Hypertension in her father, mother, sister, and sister; Thyroid disease in her mother, sister, and sister.   ROS:  Please see the history of present illness.   All other systems are personally reviewed and negative.   Physical Exam:   General: NAD Neck: No JVD, no thyromegaly or thyroid nodule.  Lungs: Dry crackles at bases.  CV: Nondisplaced PMI.  Heart regular S1/S2, no S3/S4, 2/6 HSM LLSB.  No peripheral edema.  No carotid bruit.  Normal pedal pulses.  Abdomen: Soft, nontender, no hepatosplenomegaly, no distention.  Skin: Intact without lesions or rashes.  Neurologic: Alert and oriented x 3.  Psych: Normal affect. Extremities: No clubbing or cyanosis. Left leg chronic lymphedema.  HEENT: Normal.   Recent Labs: 08/27/2021: Magnesium 2.5 12/02/2021: Hemoglobin 9.8; Platelets 197 12/20/2021: B Natriuretic Peptide 157.5 01/28/2022: ALT 12; BUN 59; Creatinine, Ser 1.66; Potassium 3.2; Sodium 140; TSH 0.397  Personally reviewed   Wt Readings from Last 3 Encounters:  01/28/22 87.8 kg (193 lb 9.6 oz)  01/07/22 87.8 kg (193 lb 9.6 oz)  12/20/21 85 kg (187 lb 6.4 oz)    BP 118/70   Pulse 60   Wt 87.8 kg (193 lb 9.6 oz)   SpO2 98% Comment: 4l n/c  BMI 37.81 kg/m   ASSESSMENT AND PLAN: 1. Atrial fibrillation with tachy-brady syndrome:  Had been off amiodarone since 2019 due to interstitial lung disease/pulmonary fibrosis. Amiodarone restarted during 1/23  admission. s/p TEE-guided DCCV 1/23. She is in NSR today.  - With worsening dyspnea and not clearly volume overloaded, I will stop amiodarone again.  Check ESR today.   - Continue Eliquis 5 mg bid.  2. 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 Echo in 3/23 showed EF 60-65%, mild LVH, normal RV, IVC normal, no definite vegetation noted. She is now off midodrine.  NYHA class IIIb symptoms, though she is not clearly volume overloaded on exam.  She is on high dose of diuretics without much improvement. It is possible that interstitial lung disease may be playing a large role in her current symptoms.   - I will arrange for RHC to assess filling pressures. If she is significantly volume overloaded, I will admit her for diuresis .  We discussed risks/benefits and she agreed to procedure.  - Continue torsemide 60 mg bid + metolazone 2.5 M/W/F.  BMET/BNP today.  - Consider SGLT2 inhibitor in future (after RHC depending on findings).  3. Pulmonary hypertension: Based on 4/19 cath, she had severe PAH with PVR 7.2 WU. Chronic hypoxemic respiratory failure, on home oxygen.  She has a history of remote DVT (2004) but V/Q scan negative for evidence of chronic PE.  High resolution CT chest in 10/19 showed pulmonary fibrosis, and PFTs in 2017 showed severe restriction.  Serological workup for rheumatological diseases was unremarkable.  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 Amiodarone was stopped in past given lung parenchymal disease then restarted. 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  - Continue macitentan 10 daily.  - Continue Selexipag 1600 bid.   - Restart sildenafil 20 mg tid.  - She cannot do  a 6 minute walk today.  - Home oxygen, CPAP.  - As above, stopping amiodarone in case it is playing a role in dyspnea.  4. Endocarditis: in 1/23.  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  Group G strep bacteremia.  Septic emboli to lungs. Seen by EP, thought to be too high risk for device extraction. Unfortunately she will be at high risk for complications going forward.  - She is now on lifetime suppression with cefadroxil.  5. Chronic hypoxemic respiratory failure:  At baseline, on 4L home oxygen with CPAP at night for interstitial lung disease and OSA.   - Continue home O2 4 L by  (baseline).  - Followup with pulmonary at [facility].  6. HTN: BP not elevated.  7. H/o DVT: Anticoagulated.  8. Left leg lymphedema: Chronic.  9. OSA: CPAP.  10. Orthostatic hypotension: This seems improved, she is off midodrine.    Signed, [name], MD  01/28/2022  [facility] [place] [address] [facility] and [place] [address] [phone] (office) [phone] (fax)

## 2022-01-28 NOTE — Patient Instructions (Addendum)
Please restart your Sildenafil '20mg'$  Three times a day  Labs done today, your results will be available in MyChart, we will contact you for abnormal readings.  You are scheduled for a Cardiac Catheterization on Tuesday, August 22 with Dr. [name].  1. Please arrive at the Main Entrance A at [facility] [place], [place] [address] at 10:00 AM (This time is two hours before your procedure to ensure your preparation). Free valet parking service is available.   Special note: Every effort is made to have your procedure done on time. Please understand that emergencies sometimes delay scheduled procedures.  2. Diet: Do not eat solid foods after midnight.  You may have clear liquids until 5 AM upon the day of the procedure.  3. Medication instructions in preparation for your procedure:   Contrast Allergy: No    Stop taking Eliquis (Apixiban) on Monday, August 21.  Stop taking, Torsemide (Demadex) Tuesday, August 22,  On the morning of your procedure, take any morning medicines NOT listed above.  You may use sips of water.  5. Plan to go home the same day, you will only stay overnight if medically necessary. 6. You MUST have a responsible adult to drive you home. 7. An adult MUST be with you the first 24 hours after you arrive home. 8. Bring a current list of your medications, and the last time and date medication taken. 9. Bring ID and current insurance cards. 10.Please wear clothes that are easy to get on and off and wear slip-on shoes.  Your physician recommends that you schedule a follow-up appointment in: 1 month   If you have any questions or concerns before your next appointment please send [place] a message through [place] or call our office at [phone].    TO LEAVE A MESSAGE FOR THE NURSE SELECT OPTION 2, PLEASE LEAVE A MESSAGE INCLUDING: YOUR NAME DATE OF BIRTH CALL BACK NUMBER REASON FOR CALL**this is important as we prioritize the call backs  YOU  WILL RECEIVE A CALL BACK THE SAME DAY AS LONG AS YOU CALL BEFORE 4:00 PM  At the [facility], you and your health needs are our priority. As part of our continuing mission to provide you with exceptional heart care, we have created designated Provider Care Teams. These Care Teams include your primary Cardiologist (physician) and Advanced Practice Providers (APPs- Physician Assistants and Nurse Practitioners) who all work together to provide you with the care you need, when you need it.   You may see any of the following providers on your designated Care Team at your next follow up: Dr Glori Bickers Dr Haynes Kerns, NP Lyda Jester, [place] [facility] [place], [place] Audry Riles, PharmD   Please be sure to bring in all your medications bottles to every appointment.

## 2022-01-28 NOTE — Addendum Note (Signed)
Encounter addended by: Larey Dresser, MD on: 01/28/2022 11:43 PM  Actions taken: Clinical Note Signed

## 2022-01-30 ENCOUNTER — Other Ambulatory Visit (HOSPITAL_COMMUNITY): Payer: Self-pay

## 2022-01-30 ENCOUNTER — Telehealth (HOSPITAL_COMMUNITY): Payer: Self-pay

## 2022-01-30 MED ORDER — POTASSIUM CHLORIDE CRYS ER 20 MEQ PO TBCR: EXTENDED_RELEASE_TABLET | ORAL | Status: DC

## 2022-01-30 MED ORDER — METOLAZONE 2.5 MG PO TABS: 2.5000 mg | ORAL_TABLET | ORAL | 2 refills | Status: DC

## 2022-01-30 NOTE — Progress Notes (Signed)
Today had a home visit with Debra Saunders.  She states does not feel well.  She states not urinating like she should and her eye hurts, making her head hurt.  Her eye is red, she has been having problems since cateract surgery.  She states just placed her drops in it and should clear up.  She seen cardiology yesterday and he just advised to increase potassium by 20 meq daily and pause the metolazone til Aug 18th and then resume twice a week.  She is aware of this change.  She was also to stop the amiodarone which she has pulled out. She thought she was still taking midodrine but last time I was here I did not place them in her box due to cardiology had stopped them. She states she has not been taking them, she could not remember.  Refilled her boxes for 2 weeks.  She has all her medications.  She talked a lot about her reunion this weekend and looking forward to it.  [place] was wanting to do a heart cath but postponed it til next week due to reunion.  She is not getting rid of the fluid with adding metolazone.   She was eating sausage  and a biscuit with egg when arrived.  She states ate hot dog yesterday.  She states drinking pepsi and placing ice chips in a 28 oz cup and refilling it 5 times.  Added the fluid up per day and she is intaking approx 140 ozs.  Discussed with her high salt foods and fluids.  She states thirsty all the time.  She states cardiology is aware.  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  Explained to her why it is importance to elevate.  She states urinates more at night than during the day, explained because she has her feet up.  She denies any chest pain or increased dizziness or shortness of breath.  She is going to beach for a week at beginning of September with family.  She states her family just wants her to sit and not do anything.  She wants to get up and cook for reunion and do more.  She  appeared depressed or just did not feel well today.  Will continue to visit for heart failure, diet and medication management.   Spike with daughter after visit also.   [place] [phone]

## 2022-01-30 NOTE — Telephone Encounter (Addendum)
   Pt aware, agreeable, and verbalized understanding  Med changes made.  ----- Message from Larey Dresser, MD sent at 01/29/2022  4:35 PM EDT ----- ESR elevated, amiodarone stopped (?lung toxicity).  Increase total daily KCl by 20 mEq.  BUN/creatinine higher, hold next 2 doses of metolazone and then decrease to twice weekly (Monday and Friday). BMET 10 days.

## 2022-02-06 ENCOUNTER — Telehealth (HOSPITAL_COMMUNITY): Payer: Self-pay | Admitting: *Deleted

## 2022-02-06 DIAGNOSIS — G4733 Obstructive sleep apnea (adult) (pediatric): Secondary | ICD-10-CM | POA: Diagnosis not present

## 2022-02-06 NOTE — Telephone Encounter (Signed)
Right heart cath approved

## 2022-02-10 ENCOUNTER — Telehealth (HOSPITAL_COMMUNITY): Payer: Self-pay

## 2022-02-10 NOTE — Telephone Encounter (Signed)
Spoke to daughter about procedure scheduled for tomorrow.Aware of holding Eliquis today and not taking torsemide am of procedure. Has transportation to and from procedure.

## 2022-02-11 ENCOUNTER — Other Ambulatory Visit: Payer: Self-pay

## 2022-02-11 ENCOUNTER — Encounter (HOSPITAL_COMMUNITY)
Admission: RE | Disposition: A | Discharge status: Home / Self Care | Payer: Self-pay | Source: Home / Self Care | Attending: Cardiology

## 2022-02-11 ENCOUNTER — Ambulatory Visit (HOSPITAL_COMMUNITY)
Admission: RE | Admit: 2022-02-11 | Discharge: 2022-02-11 | Disposition: A | Discharge status: Home / Self Care | Payer: Medicare HMO | Attending: Cardiology | Admitting: Cardiology

## 2022-02-11 DIAGNOSIS — I890 Lymphedema, not elsewhere classified: Secondary | ICD-10-CM | POA: Insufficient documentation

## 2022-02-11 DIAGNOSIS — I4891 Unspecified atrial fibrillation: Secondary | ICD-10-CM | POA: Insufficient documentation

## 2022-02-11 DIAGNOSIS — Z9981 Dependence on supplemental oxygen: Secondary | ICD-10-CM | POA: Diagnosis not present

## 2022-02-11 DIAGNOSIS — Z79899 Other long term (current) drug therapy: Secondary | ICD-10-CM | POA: Diagnosis not present

## 2022-02-11 DIAGNOSIS — I2510 Atherosclerotic heart disease of native coronary artery without angina pectoris: Secondary | ICD-10-CM | POA: Diagnosis not present

## 2022-02-11 DIAGNOSIS — J8410 Pulmonary fibrosis, unspecified: Secondary | ICD-10-CM | POA: Diagnosis not present

## 2022-02-11 DIAGNOSIS — Z87891 Personal history of nicotine dependence: Secondary | ICD-10-CM | POA: Diagnosis not present

## 2022-02-11 DIAGNOSIS — N183 Chronic kidney disease, stage 3 unspecified: Secondary | ICD-10-CM | POA: Diagnosis not present

## 2022-02-11 DIAGNOSIS — I495 Sick sinus syndrome: Secondary | ICD-10-CM | POA: Insufficient documentation

## 2022-02-11 DIAGNOSIS — Z86718 Personal history of other venous thrombosis and embolism: Secondary | ICD-10-CM | POA: Diagnosis not present

## 2022-02-11 DIAGNOSIS — J449 Chronic obstructive pulmonary disease, unspecified: Secondary | ICD-10-CM | POA: Insufficient documentation

## 2022-02-11 DIAGNOSIS — I272 Pulmonary hypertension, unspecified: Secondary | ICD-10-CM | POA: Diagnosis not present

## 2022-02-11 DIAGNOSIS — I2721 Secondary pulmonary arterial hypertension: Secondary | ICD-10-CM

## 2022-02-11 DIAGNOSIS — G4733 Obstructive sleep apnea (adult) (pediatric): Secondary | ICD-10-CM | POA: Diagnosis not present

## 2022-02-11 DIAGNOSIS — I5033 Acute on chronic diastolic (congestive) heart failure: Secondary | ICD-10-CM | POA: Insufficient documentation

## 2022-02-11 DIAGNOSIS — Z950 Presence of cardiac pacemaker: Secondary | ICD-10-CM | POA: Diagnosis not present

## 2022-02-11 DIAGNOSIS — I130 Hypertensive heart and chronic kidney disease with heart failure and stage 1 through stage 4 chronic kidney disease, or unspecified chronic kidney disease: Secondary | ICD-10-CM | POA: Diagnosis not present

## 2022-02-11 DIAGNOSIS — E1122 Type 2 diabetes mellitus with diabetic chronic kidney disease: Secondary | ICD-10-CM | POA: Insufficient documentation

## 2022-02-11 DIAGNOSIS — Z7901 Long term (current) use of anticoagulants: Secondary | ICD-10-CM | POA: Insufficient documentation

## 2022-02-11 HISTORY — PX: RIGHT HEART CATH: CATH118263

## 2022-02-11 LAB — POCT I-STAT EG7
Acid-Base Excess: 7 mmol/L — ABNORMAL HIGH (ref 0.0–2.0)
Acid-Base Excess: 7 mmol/L — ABNORMAL HIGH (ref 0.0–2.0)
Bicarbonate: 33.1 mmol/L — ABNORMAL HIGH (ref 20.0–28.0)
Bicarbonate: 33.3 mmol/L — ABNORMAL HIGH (ref 20.0–28.0)
Calcium, Ion: 1.21 mmol/L (ref 1.15–1.40)
Calcium, Ion: 1.22 mmol/L (ref 1.15–1.40)
HCT: 31 % — ABNORMAL LOW (ref 36.0–46.0)
HCT: 31 % — ABNORMAL LOW (ref 36.0–46.0)
Hemoglobin: 10.5 g/dL — ABNORMAL LOW (ref 12.0–15.0)
Hemoglobin: 10.5 g/dL — ABNORMAL LOW (ref 12.0–15.0)
O2 Saturation: 70 %
O2 Saturation: 70 %
Potassium: 3.3 mmol/L — ABNORMAL LOW (ref 3.5–5.1)
Potassium: 3.3 mmol/L — ABNORMAL LOW (ref 3.5–5.1)
Sodium: 142 mmol/L (ref 135–145)
Sodium: 142 mmol/L (ref 135–145)
TCO2: 35 mmol/L — ABNORMAL HIGH (ref 22–32)
TCO2: 35 mmol/L — ABNORMAL HIGH (ref 22–32)
pCO2, Ven: 55.9 mmHg (ref 44–60)
pCO2, Ven: 56.2 mmHg (ref 44–60)
pH, Ven: 7.38 (ref 7.25–7.43)
pH, Ven: 7.381 (ref 7.25–7.43)
pO2, Ven: 38 mmHg (ref 32–45)
pO2, Ven: 38 mmHg (ref 32–45)

## 2022-02-11 LAB — CBC
HCT: 32.8 % — ABNORMAL LOW (ref 36.0–46.0)
Hemoglobin: 10 g/dL — ABNORMAL LOW (ref 12.0–15.0)
MCH: 27.7 pg (ref 26.0–34.0)
MCHC: 30.5 g/dL (ref 30.0–36.0)
MCV: 90.9 fL (ref 80.0–100.0)
Platelets: 191 10*3/uL (ref 150–400)
RBC: 3.61 MIL/uL — ABNORMAL LOW (ref 3.87–5.11)
RDW: 11.9 % (ref 11.5–15.5)
WBC: 4.1 10*3/uL (ref 4.0–10.5)
nRBC: 0 % (ref 0.0–0.2)

## 2022-02-11 LAB — BASIC METABOLIC PANEL
Anion gap: 11 (ref 5–15)
BUN: 51 mg/dL — ABNORMAL HIGH (ref 8–23)
CO2: 29 mmol/L (ref 22–32)
Calcium: 9.3 mg/dL (ref 8.9–10.3)
Chloride: 102 mmol/L (ref 98–111)
Creatinine, Ser: 1.71 mg/dL — ABNORMAL HIGH (ref 0.44–1.00)
GFR, Estimated: 30 mL/min — ABNORMAL LOW (ref >60)
Glucose, Bld: 124 mg/dL — ABNORMAL HIGH (ref 70–99)
Potassium: 3.7 mmol/L (ref 3.5–5.1)
Sodium: 142 mmol/L (ref 135–145)

## 2022-02-11 LAB — GLUCOSE, CAPILLARY: Glucose-Capillary: 121 mg/dL — ABNORMAL HIGH (ref 70–99)

## 2022-02-11 SURGERY — RIGHT HEART CATH
Anesthesia: LOCAL

## 2022-02-11 MED ORDER — LIDOCAINE HCL (PF) 1 % IJ SOLN
INTRAMUSCULAR | Status: DC | PRN
  Administered 2022-02-11: 2 mL via INTRADERMAL

## 2022-02-11 MED ORDER — ACETAMINOPHEN 325 MG PO TABS
650.0000 mg | ORAL_TABLET | ORAL | Status: DC | PRN
Start: 2022-02-11 — End: 2022-02-11

## 2022-02-11 MED ORDER — SODIUM CHLORIDE 0.9 % IV SOLN: INTRAVENOUS | Status: DC

## 2022-02-11 MED ORDER — SODIUM CHLORIDE 0.9% FLUSH: 3.0000 mL | INTRAVENOUS | Status: DC | PRN

## 2022-02-11 MED ORDER — LABETALOL HCL 5 MG/ML IV SOLN: 10.0000 mg | INTRAVENOUS | Status: DC | PRN

## 2022-02-11 MED ORDER — SODIUM CHLORIDE 0.9% FLUSH: 3.0000 mL | Freq: Two times a day (BID) | INTRAVENOUS | Status: DC

## 2022-02-11 MED ORDER — SODIUM CHLORIDE 0.9 % IV SOLN: 250.0000 mL | INTRAVENOUS | Status: DC | PRN

## 2022-02-11 MED ORDER — HYDRALAZINE HCL 20 MG/ML IJ SOLN: 10.0000 mg | INTRAMUSCULAR | Status: DC | PRN

## 2022-02-11 MED ORDER — ONDANSETRON HCL 4 MG/2ML IJ SOLN: 4.0000 mg | Freq: Four times a day (QID) | INTRAMUSCULAR | Status: DC | PRN

## 2022-02-11 SURGICAL SUPPLY — 7 items
CATH BALLN WEDGE 5F 110CM (CATHETERS) IMPLANT
GUIDEWIRE .025 260CM (WIRE) IMPLANT
KIT HEART LEFT (KITS) ×1 IMPLANT
PACK CARDIAC CATHETERIZATION (CUSTOM PROCEDURE TRAY) ×1 IMPLANT
SHEATH GLIDE SLENDER 4/5FR (SHEATH) IMPLANT
TRANSDUCER W/STOPCOCK (MISCELLANEOUS) ×1 IMPLANT
WIRE MICROINTRODUCER 60CM (WIRE) IMPLANT

## 2022-02-11 NOTE — Interval H&P Note (Signed)
History and Physical Interval Note:  02/11/2022 12:33 PM  Debra Saunders  has presented today for surgery, with the diagnosis of pulmonary HTN.  The various methods of treatment have been discussed with the patient and family. After consideration of risks, benefits and other options for treatment, the patient has consented to  Procedure(s): RIGHT HEART CATH (N/A) as a surgical intervention.  The patient's history has been reviewed, patient examined, no change in status, stable for surgery.  I have reviewed the patient's chart and labs.  Questions were answered to the patient's satisfaction.     Vidalia Serpas [employer]

## 2022-02-12 ENCOUNTER — Encounter (HOSPITAL_COMMUNITY): Payer: Self-pay | Admitting: Cardiology

## 2022-02-19 ENCOUNTER — Ambulatory Visit (INDEPENDENT_AMBULATORY_CARE_PROVIDER_SITE_OTHER): Payer: Medicare HMO

## 2022-02-19 VITALS — Ht 61.0 in | Wt 191.0 lb

## 2022-02-19 DIAGNOSIS — Z0000 Encounter for general adult medical examination without abnormal findings: Secondary | ICD-10-CM | POA: Diagnosis not present

## 2022-02-19 NOTE — Progress Notes (Signed)
Subjective:   Debra Saunders is a 76 y.o. female who presents for Medicare Annual (Subsequent) preventive examination.  Review of Systems    No ROS.  Medicare Wellness Virtual Visit.  Visual/audio telehealth visit, UTA vital signs.   See social history for additional risk factors.   Cardiac Risk Factors include: advanced age (>82mn, >>48women);hypertension     Objective:    Today's Vitals   02/19/22 1407  Weight: 191 lb (86.6 kg)  Height: '5\' 1"'$  (1.549 m)   Body mass index is 36.09 kg/m.     02/19/2022    2:16 PM 02/11/2022   10:44 AM 01/14/2022    2:02 PM 12/02/2021    1:12 PM 08/21/2021    6:18 PM 07/31/2021    1:08 PM 06/30/2021    6:09 PM  Advanced Directives  Does Patient Have a Medical Advance Directive? Yes Yes Yes No No No No  Type of AParamedicof ATrail SideLiving will Healthcare Power of [place]     Does patient want to make changes to medical advance directive? No - Patient declined  No - Patient declined      Copy of [place]in Chart? No - copy requested No - copy requested No - copy requested      Would patient like information on creating a medical advance directive?    No - Patient declined No - Patient declined No - Patient declined No - Patient declined    Current Medications (verified) Outpatient Encounter Medications as of 02/19/2022  Medication Sig   acetaminophen (TYLENOL) 325 MG tablet Take 325-650 mg by mouth every 6 (six) hours as needed for mild pain.   albuterol (ACCUNEB) 0.63 MG/3ML nebulizer solution Take 1 ampule by nebulization every 6 (six) hours as needed for wheezing.   amiodarone (PACERONE) 200 MG tablet Take 200 mg by mouth daily.   apixaban (ELIQUIS) 5 MG TABS tablet TAKE 1 TABLET BY MOUTH TWICE DAILY. APPOINTMENT NEEDED FOR FURTHER REFILLS.   benzonatate (TESSALON) 200 MG capsule Take 1 capsule (200 mg total) by mouth 3 (three) times daily as needed for cough.  (Patient not taking: Reported on 01/30/2022)   cefadroxil (DURICEF) 500 MG capsule Take 1 capsule (500 mg total) by mouth 2 (two) times daily.   Difluprednate 0.05 % EMUL Place 1 drop into both eyes 2 (two) times daily.   DULoxetine (CYMBALTA) 30 MG capsule Take 30 mg by mouth daily.   gabapentin (NEURONTIN) 300 MG capsule Take 2 capsules (600 mg total) by mouth 3 (three) times daily.   macitentan (OPSUMIT) 10 MG tablet Take 1 tablet (10 mg total) by mouth daily.   meclizine (ANTIVERT) 25 MG tablet Take 1 tablet (25 mg total) by mouth 3 (three) times daily as needed for dizziness.   Menthol, Topical Analgesic, (BIOFREEZE EX) Apply 1 application topically 4 (four) times daily as needed (for pain).   metolazone (ZAROXOLYN) 2.5 MG tablet Take 1 tablet (2.5 mg total) by mouth 2 (two) times a week. Every Monday and Friday (Patient taking differently: Take 2.5 mg by mouth every Monday, Wednesday, and Friday. she is to pause til Aug 18th per lab work)   potassium chloride SA (KLOR-CON M) 20 MEQ tablet Take 1.5 tablets Twice daily (Patient taking differently: Take 40 mEq by mouth 2 (two) times daily.)   rosuvastatin (CRESTOR) 10 MG tablet Take 1 tablet (10 mg total) by mouth daily.   Selexipag (UPTRAVI) 1600 MCG  TABS Take 1 tablet (1,600 mcg total) by mouth 2 (two) times daily.   sildenafil (REVATIO) 20 MG tablet Take 1 tablet (20 mg total) by mouth 3 (three) times daily.   torsemide (DEMADEX) 20 MG tablet Take 3 tablets (60 mg total) by mouth 2 (two) times daily. Patient takes 3 tablets by mouth twice a day.   No facility-administered encounter medications on file as of 02/19/2022.    Allergies (verified) Aspirin and Other   History: Past Medical History:  Diagnosis Date   Arthritis    Atrial fibrillation, persistent ([place])    a. s/p TEE-guided DCCV on 08/20/2017; b. 06/2021 Recurrent Afib-->amio/TEE/DCCV.   CAD (coronary artery disease)    a. 01/2012 Cath: nonobs dzs; b. 04/2014 MV: No ischemia; c.  09/2017 Cath: Minimal nonobs RCA dzs, otw nl cors.   Cardiorenal syndrome    a. 09/2017 Creat rose to 2.7 w/ diuresis-->HD x 2.   Chronic back pain    Chronic combined systolic (congestive) and diastolic (congestive) heart failure ([place])    a. Dx 2005 @ [facility];  b. 02/2014 Echo: EF 55-60%; c. 07/2017: EF 30-35%; d. 09/2017 Echo: EF 40-45%; e. 02/2018 Echo: EF 55-60%; f. 08/2019 Echo: EF 60-65%; g. 08/2020 RHC: RA 3, RV 30/1, PA 33/11 (20), PCWP 6; f. 08/2021 Echo: EF 60-65%, no rwma, GrIDD, nl RV fxn, RVSP 40.40mHg, mod dil LA, mod TR.   Chronic respiratory failure (HCC)    a. on home O2   Gastritis    History of intracranial hemorrhage    a. 6/14 described by neurosurgery as "small frontal posttraumatic SAH " - pt was on xarelto @ the time.   Hyperlipidemia    Hypertension    Interstitial lung disease (HCC)    Lipoma of back    Lymphedema    a. Chronic LLE edema.   NICM (nonischemic cardiomyopathy) ([place]    a. 09/2017 Echo: EF 40-45%; b. 09/2017 Cath: minor, insignificant CAD; c. 02/2018 Echo: EF 55-60%, Gr1 DD; d. 08/2019 Echo: EF 60-65%, Gr2 DD.   Obesity    PAH (pulmonary artery hypertension) ([facility]    a. 09/2017 Echo: PASP 626mg; b. 09/2017 RHC: RA 23, RV 84/13, PA 84/29, PCWP 20, CO 3.89, CI 1.89. LVEDP 18; c. 08/2020 RHC: RA 3, RV 30/1, PA 33/11 (20), PCWP 6. CO/CI 6.18/3.36. PVR 2.3 WU.   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 06/2021. Veg on PPM lead. Not candidate for extraction-->s/p 6 wks abx.   Sepsis with metabolic encephalopathy ([facility] [place]   Sleep apnea    Streptococcal bacteremia    [facility]   Subarachnoid hemorrhage ([facility]/2014   Symptomatic bradycardia    a. s/p BSX PPM.   Thyroid nodule    Type II diabetes mellitus ([facility]   Urinary incontinence    Past Surgical History:  Procedure Laterality Date   ABDOMINAL HYSTERECTOMY  1969   BREAST BIOPSY Right 10/28/2019   lymph node bx, [facility], pending path    CARDIAC  CATHETERIZATION  2013   [facility]: Only 20% ostial left circumflex.   CARDIOVERSION N/A 10/12/2017   Procedure: CARDIOVERSION;  Surgeon: GoMinna MerrittsMD;  [facility];  Service: Cardiovascular;  Laterality: N/A;   CARDIOVERSION N/A 07/04/2021   Procedure: CARDIOVERSION;  Surgeon: McLarey DresserMD;  [facility] Service: Cardiovascular;  Laterality: N/A;   CATARACT EXTRACTION W/PHACO Right 09/19/2020   Procedure:  CATARACT EXTRACTION PHACO AND INTRAOCULAR LENS PLACEMENT (IOC) RIGHT DIABETIC 3.95 00:43.0 9.2%;  Surgeon: Leandrew Koyanagi, MD;  Location: [place];  Service: Ophthalmology;  Laterality: Right;  Pt requests to be last sleep apnea   CATARACT EXTRACTION W/PHACO Left 10/03/2020   Procedure: CATARACT EXTRACTION PHACO AND INTRAOCULAR LENS PLACEMENT ([place]) LEFT DIABETIC;  Surgeon: Leandrew Koyanagi, MD;  Location: [place];  Service: Ophthalmology;  Laterality: Left;  3.25 0:59.3 5.5%   CHOLECYSTECTOMY     COLONOSCOPY WITH PROPOFOL N/A 09/10/2018   Procedure: COLONOSCOPY WITH PROPOFOL;  Surgeon: Manya Silvas, MD;  Location[facility] ENDOSCOPY;  Service: Endoscopy;  Laterality: N/A;   CYST REMOVAL TRUNK  2002   BACK   DIALYSIS/PERMA CATHETER INSERTION N/A 10/06/2017   Procedure: DIALYSIS/PERMA CATHETER INSERTION;  Surgeon: Katha Cabal, MD;  Location: [place] CV LAB;  Service: Cardiovascular;  Laterality: N/A;   INSERT / REPLACE / REMOVE PACEMAKER     lipoma removal  2014   back   PACEMAKER INSERTION  2015   [employer] dual chamber pacemaker implanted by Dr Caryl Comes for symptomatic bradycardia   PERMANENT PACEMAKER INSERTION N/A 01/09/2014   Procedure: PERMANENT PACEMAKER INSERTION;  Surgeon: Deboraha Sprang, MD;  Location[facility] CATH LAB;  Service: Cardiovascular;  Laterality: N/A;   RIGHT HEART CATH N/A 04/19/2018   Procedure: RIGHT HEART CATH;  Surgeon: Larey Dresser, MD;  Location: [place] CV LAB;   Service: Cardiovascular;  Laterality: N/A;   RIGHT HEART CATH N/A 08/30/2020   Procedure: RIGHT HEART CATH;  Surgeon: Larey Dresser, MD;  Location: [place] CV LAB;  Service: Cardiovascular;  Laterality: N/A;   RIGHT HEART CATH N/A 02/11/2022   Procedure: RIGHT HEART CATH;  Surgeon: Larey Dresser, MD;  Location: [place] CV LAB;  Service: Cardiovascular;  Laterality: N/A;   RIGHT/LEFT HEART CATH AND CORONARY ANGIOGRAPHY N/A 10/19/2017   Procedure: RIGHT/LEFT HEART CATH AND CORONARY ANGIOGRAPHY;  Surgeon: Wellington Hampshire, MD;  Location: [place] CV LAB;  Service: Cardiovascular;  Laterality: N/A;   TEE WITHOUT CARDIOVERSION N/A 08/20/2017   Procedure: TRANSESOPHAGEAL ECHOCARDIOGRAM (TEE) & Direct current cardioversion;  Surgeon: Minna Merritts, MD;  Location: [facility];  Service: Cardiovascular;  Laterality: N/A;   TEE WITHOUT CARDIOVERSION N/A 06/28/2021   Procedure: TRANSESOPHAGEAL ECHOCARDIOGRAM (TEE);  Surgeon: Minna Merritts, MD;  Location: [facility] ORS;  Service: Cardiovascular;  Laterality: N/A;   TEE WITHOUT CARDIOVERSION N/A 07/04/2021   Procedure: TRANSESOPHAGEAL ECHOCARDIOGRAM (TEE);  Surgeon: Larey Dresser, MD;  Location[facility] ENDOSCOPY;  Service: Cardiovascular;  Laterality: N/A;   TRIGGER FINGER RELEASE     Family History  Problem Relation Age of Onset   Heart disease Mother    Hypertension Mother    COPD Mother        was a smoker   Thyroid disease Mother    Hypertension Father    Hypertension Sister    Thyroid disease Sister    Breast cancer Sister    Hypertension Sister    Thyroid disease Sister    Bone cancer Sister    Stroke Son    Breast cancer Maternal Aunt    Kidney disease Neg Hx    Bladder Cancer Neg Hx    Social History   Socioeconomic History   Marital status: Divorced    Spouse name: Not on file   Number of children: 4   Years of education: 12   Highest education level: High school graduate  Occupational History  Occupation:  Retired- factory work    Comment: exposed to Pharmacist, community  Tobacco Use   Smoking status: Former    Packs/day: 0.30    Years: 2.00    Total pack years: 0.60    Types: Cigarettes    Quit date: 06/23/1990    Years since quitting: 31.6   Smokeless tobacco: Never  Vaping Use   Vaping Use: Never used  Substance and Sexual Activity   Alcohol use: No   Drug use: No   Sexual activity: Not Currently  Other Topics Concern   Not on file  Social History Narrative   Lives in [place] with daughter. Has 4 children. No pets.      Work - Restaurant manager, fast food, retired      Diet - regular      02/14/2019: reports has Meals On Wheels delivered to her home;       Remote hx of smoking > 30 years; No alcohol-    Social Determinants of Health   Financial Resource Strain: Low Risk  (02/19/2022)   Overall Financial Resource Strain (CARDIA)    Difficulty of Paying Living Expenses: Not hard at all  Food Insecurity: No Food Insecurity (02/19/2022)   Hunger Vital Sign    Worried About Running Out of Food in the Last Year: Never true    [place] in the Last Year: Never true  Transportation Needs: Unmet Transportation Needs (01/15/2022)   PRAPARE - Transportation    Lack of Transportation (Medical): Yes    Lack of Transportation (Non-Medical): Yes  Physical Activity: Inactive (01/05/2018)   Exercise Vital Sign    Days of Exercise per Week: 0 days    Minutes of Exercise per Session: 0 min  Stress: No Stress Concern Present (02/19/2022)   [place] of Stress : Not at all  Social Connections: Socially Isolated (02/19/2022)   Social Connection and Isolation Panel [NHANES]    Frequency of Communication with Friends and Family: More than three times a week    Frequency of Social Gatherings with Friends and Family: Once a week    Attends Religious Services: Never    Marine scientist or Organizations: No    Attends Programme researcher, broadcasting/film/video: Never    Marital Status: Divorced   Tobacco Counseling Counseling given: Not Answered  Clinical Intake: Pre-visit preparation completed: Yes        Diabetes:  (Followed by PCP)  How often do you need to have someone help you when you read instructions, pamphlets, or other written materials from your doctor or pharmacy?: 1 - Never  Interpreter Needed?: No    Activities of Daily Living    02/19/2022    2:20 PM 02/11/2022   10:33 AM  In your present state of health, do you have any difficulty performing the following activities:  Hearing? 0 0  Vision? 0 0  Difficulty concentrating or making decisions? 0 0  Walking or climbing stairs? 1 0  Comment Walker in use   Dressing or bathing? 0 1  Doing errands, shopping? 1   Comment Family assist   Comment Family assist as needed   Using the Toilet? N   In the past six months, have you accidently leaked urine? N   Do you have problems with loss of bowel control? N   Managing your Medications? Y   Comment Aid assist   Comment Family assist as needed  Comment Family assist as needed     Patient Care Team: [name], MD as PCP - General (Family Medicine) Wellington Hampshire, MD as PCP - Cardiology (Cardiology) Deboraha Sprang, MD as PCP - Electrophysiology (Cardiology) Thom Chimes, MD as Referring Physician (Allergy) Juanito Doom, MD as Referring Physician (Internal Medicine) Cammie Sickle, MD as Consulting Physician (Hematology and Oncology) Florance, Tomasa Blase, RN as [place] any recent [place] you may have received from other than [facility] providers in the past year (date may be approximate).     Assessment:   This is a routine wellness examination for Debra Saunders.  Virtual Visit via Telephone Note  I connected with  Debra Saunders on 02/19/22 at  2:00 PM EDT by telephone and verified that I am speaking with the correct  person using two identifiers.  Location: Patient: home Provider: office Persons participating in the virtual visit: patient/Nurse Health Advisor   I discussed the limitations of performing an evaluation and management service by telehealth. We continued and completed visit with audio only. Some vital signs may be absent or patient reported.   Hearing/Vision screen Hearing Screening - Comments:: Patient is able to hear conversational tones without difficulty. No issues reported.  Vision Screening - Comments:: Followed by [facility] Cataract extraction, bilateral.  No retinopathy reported.  They have seen their ophthalmologist in the last 12 months.   Dietary issues and exercise activities discussed:   Healthy diet   Goals Addressed               This Visit's Progress     Patient Stated     Increase physical activity (pt-stated)        As tolerated       Depression Screen    02/19/2022    2:16 PM 01/14/2022    1:54 PM 11/25/2021    2:59 PM 09/06/2021    3:46 PM 08/30/2021    9:58 AM 07/25/2021   10:10 AM 05/02/2021   10:16 AM  PHQ 2/9 Scores  PHQ - 2 Score 0 0 0 0 0 0 0    Fall Risk    02/19/2022    2:17 PM 01/14/2022    2:03 PM 11/25/2021    2:59 PM 09/25/2021   12:10 PM 07/25/2021   10:10 AM  [place] in the past year? '1 1 1 '$ 0 0  Number falls in past yr: 0 0 0 0 0  Injury with Fall?  0 0 0 0  Risk for fall due to : Impaired balance/gait History of fall(s);Impaired balance/gait;Impaired mobility;Medication side effect Impaired balance/gait;Impaired mobility;History of fall(s) No Fall Risks No Fall Risks  Risk for fall due to: Comment Walker in use  In w/c    Follow up  Falls evaluation completed;Education provided Falls evaluation completed Falls evaluation completed Falls evaluation completed    [place]: Home free of loose throw rugs in walkways, pet beds, electrical cords, etc? Yes  Adequate lighting in your  home to reduce risk of falls? Yes   ASSISTIVE DEVICES UTILIZED TO PREVENT FALLS:  Life alert? Yes  Use of a cane, walker or w/c? Yes  Grab bars in the bathroom? Yes  Shower chair or bench in shower? Yes  Elevated toilet seat or a handicapped toilet? Yes   TIMED UP AND GO: Was the test performed? No .    Cognitive Function:  02/19/2022    2:22 PM 12/15/2019    2:32 PM 07/02/2018    2:26 PM 07/01/2017    1:43 PM  6CIT Screen  What Year? 0 points 0 points 0 points 0 points  What month? 0 points 0 points 0 points 0 points  What time? 0 points 0 points 0 points 0 points  Count back from 20 0 points  0 points 0 points  Months in reverse 0 points 0 points 0 points 0 points  Repeat phrase 0 points  0 points 0 points  Total Score 0 points  0 points 0 points    Immunizations Immunization History  Administered Date(s) Administered   Fluad Quad(high Dose 65+) 07/08/2019, 04/10/2020, 04/01/2021   Influenza, High Dose Seasonal PF 07/23/2016, 06/15/2017, 07/02/2018   Influenza, Seasonal, Injecte, Preservative Fre 07/27/2012   Influenza,inj,Quad PF,6+ Mos 06/10/2013, 05/01/2014, 03/19/2015   PFIZER(Purple Top)SARS-COV-2 Vaccination 09/23/2019, 10/17/2019   Pneumococcal Conjugate-13 10/06/2013   Pneumococcal Polysaccharide-23 04/28/2012   Td 08/03/2013   Shingrix Completed?: No.    Education has been provided regarding the importance of this vaccine. Patient has been advised to call insurance company to determine out of pocket expense if they have not yet received this vaccine. Advised may also receive vaccine at local pharmacy or Health Dept. Verbalized acceptance and understanding.  Screening Tests Health Maintenance  Topic Date Due   OPHTHALMOLOGY EXAM  02/19/2022   COVID-19 Vaccine (3 - Pfizer risk series) 03/07/2022 (Originally 11/14/2019)   FOOT EXAM  03/22/2022 (Originally 11/03/2020)   HEMOGLOBIN A1C  03/22/2022 (Originally 12/23/2021)   URINE MICROALBUMIN  03/22/2022  (Originally 07/07/2020)   Zoster Vaccines- Shingrix (1 of 2) 05/22/2022 (Originally 07/01/1964)   INFLUENZA VACCINE  09/21/2022 (Originally 01/21/2022)   TETANUS/TDAP  08/04/2023   Pneumonia Vaccine 58+ Years old  Completed   DEXA SCAN  Completed   Hepatitis C Screening  Completed   HPV VACCINES  Aged Out   COLONOSCOPY (Pts 45-73yr Insurance coverage will need to be confirmed)  Discontinued   Health Maintenance Health Maintenance Due  Topic Date Due   OPHTHALMOLOGY EXAM  02/19/2022   Lung Cancer Screening: (Low Dose CT Chest recommended if Age 78-80years, 30 pack-year currently smoking OR have quit w/in 15years.) does not qualify.   Vision Screening: Recommended annual ophthalmology exams for early detection of glaucoma and other disorders of the eye.  Dental Screening: Recommended annual dental exams for proper oral hygiene  Community Resource Referral / Chronic Care Management: CRR required this visit?  No   CCM required this visit?  No      Plan:     I have personally reviewed and noted the following in the patient's chart:   Medical and social history Use of alcohol, tobacco or illicit drugs  Current medications and supplements including opioid prescriptions. Patient is not currently taking opioid prescriptions. Functional ability and status Nutritional status Physical activity Advanced directives List of other physicians Hospitalizations, surgeries, and ER visits in previous 12 months Vitals Screenings to include cognitive, depression, and falls Referrals and appointments  In addition, I have reviewed and discussed with patient certain preventive protocols, quality metrics, and best practice recommendations. A written personalized care plan for preventive services as well as general preventive health recommendations were provided to patient.     OVarney Biles LPN   [id number]

## 2022-02-19 NOTE — Patient Instructions (Addendum)
[name] , Thank you for taking time to come for your Medicare Wellness Visit. I appreciate your ongoing commitment to your health goals. Please review the following plan we discussed and let me know if I can assist you in the future.   These are the goals we discussed:  Goals       Patient Stated     Increase physical activity (pt-stated)      As tolerated      Monitor Home Readings (pt-stated)      Patient Goals/Self-Care Activities Over the next 90 days, patient will:  - take medications as prescribed check blood glucose BID, document, and provide at future appointments weight daily, and contact provider if weight gain of > 3 lbs in 1 day, >5 lbs in 1 week        This is a list of the screening recommended for you and due dates:  Health Maintenance  Topic Date Due   Eye exam for diabetics  02/19/2022   COVID-19 Vaccine (3 - Pfizer risk series) 03/07/2022*   Complete foot exam   03/22/2022*   Hemoglobin A1C  03/22/2022*   Urine Protein Check  03/22/2022*   Zoster (Shingles) Vaccine (1 of 2) 05/22/2022*   Flu Shot  09/21/2022*   Tetanus Vaccine  08/04/2023   Pneumonia Vaccine  Completed   DEXA scan (bone density measurement)  Completed   Hepatitis C Screening: USPSTF Recommendation to screen - Ages 42-79 yo.  Completed   HPV Vaccine  Aged Out   Colon Cancer Screening  Discontinued  *Topic was postponed. The date shown is not the original due date.    Preventive Care 16 Years and Older, Female Preventive care refers to lifestyle choices and visits with your health care provider that can promote health and wellness. What does preventive care include? A yearly physical exam. This is also called an annual well check. Dental exams once or twice a year. Routine eye exams. Ask your health care provider how often you should have your eyes checked. Personal lifestyle choices, including: Daily care of your teeth and gums. Regular physical activity. Eating a healthy  diet. Avoiding tobacco and drug use. Limiting alcohol use. Practicing safe sex. Taking low-dose aspirin every day. Taking vitamin and mineral supplements as recommended by your health care provider. What happens during an annual well check? The services and screenings done by your health care provider during your annual well check will depend on your age, overall health, lifestyle risk factors, and family history of disease. Counseling  Your health care provider may ask you questions about your: Alcohol use. Tobacco use. Drug use. Emotional well-being. Home and relationship well-being. Sexual activity. Eating habits. History of falls. Memory and ability to understand (cognition). Work and work Statistician. Reproductive health. Screening  You may have the following tests or measurements: Height, weight, and BMI. Blood pressure. Lipid and cholesterol levels. These may be checked every 5 years, or more frequently if you are over 41 years old. Skin check. Lung cancer screening. You may have this screening every year starting at age 85 if you have a 30-pack-year history of smoking and currently smoke or have quit within the past 15 years. Fecal occult blood test (FOBT) of the stool. You may have this test every year starting at age 29. Flexible sigmoidoscopy or colonoscopy. You may have a sigmoidoscopy every 5 years or a colonoscopy every 10 years starting at age 12. Hepatitis C blood test. Hepatitis B blood test. Sexually transmitted disease (  STD) testing. Diabetes screening. This is done by checking your blood sugar (glucose) after you have not eaten for a while (fasting). You may have this done every 1-3 years. Bone density scan. This is done to screen for osteoporosis. You may have this done starting at age 34. Mammogram. This may be done every 1-2 years. Talk to your health care provider about how often you should have regular mammograms. Talk with your health care provider about  your test results, treatment options, and if necessary, the need for more tests. Vaccines  Your health care provider may recommend certain vaccines, such as: Influenza vaccine. This is recommended every year. Tetanus, diphtheria, and acellular pertussis (Tdap, Td) vaccine. You may need a Td booster every 10 years. Zoster vaccine. You may need this after age 73. Pneumococcal 13-valent conjugate (PCV13) vaccine. One dose is recommended after age 52. Pneumococcal polysaccharide (PPSV23) vaccine. One dose is recommended after age 40. Talk to your health care provider about which screenings and vaccines you need and how often you need them. This information is not intended to replace advice given to you by your health care provider. Make sure you discuss any questions you have with your health care provider. Document Released: 07/06/2015 Document Revised: 02/27/2016 Document Reviewed: 04/10/2015 Elsevier Interactive Patient Education  2017 [place] Prevention in the Home Falls can cause injuries. They can happen to people of all ages. There are many things you can do to make your home safe and to help prevent falls. What can I do on the outside of my home? Regularly fix the edges of walkways and driveways and fix any cracks. Remove anything that might make you trip as you walk through a door, such as a raised step or threshold. Trim any bushes or trees on the path to your home. Use bright outdoor lighting. Clear any walking paths of anything that might make someone trip, such as rocks or tools. Regularly check to see if handrails are loose or broken. Make sure that both sides of any steps have handrails. Any raised decks and porches should have guardrails on the edges. Have any leaves, snow, or ice cleared regularly. Use sand or salt on walking paths during winter. Clean up any spills in your garage right away. This includes oil or grease spills. What can I do in the bathroom? Use  night lights. Install grab bars by the toilet and in the tub and shower. Do not use towel bars as grab bars. Use non-skid mats or decals in the tub or shower. If you need to sit down in the shower, use a plastic, non-slip stool. Keep the floor dry. Clean up any water that spills on the floor as soon as it happens. Remove soap buildup in the tub or shower regularly. Attach bath mats securely with double-sided non-slip rug tape. Do not have throw rugs and other things on the floor that can make you trip. What can I do in the bedroom? Use night lights. Make sure that you have a light by your bed that is easy to reach. Do not use any sheets or blankets that are too big for your bed. They should not hang down onto the floor. Have a firm chair that has side arms. You can use this for support while you get dressed. Do not have throw rugs and other things on the floor that can make you trip. What can I do in the kitchen? Clean up any spills right away. Avoid walking on  wet floors. Keep items that you use a lot in easy-to-reach places. If you need to reach something above you, use a strong step stool that has a grab bar. Keep electrical cords out of the way. Do not use floor polish or wax that makes floors slippery. If you must use wax, use non-skid floor wax. Do not have throw rugs and other things on the floor that can make you trip. What can I do with my stairs? Do not leave any items on the stairs. Make sure that there are handrails on both sides of the stairs and use them. Fix handrails that are broken or loose. Make sure that handrails are as long as the stairways. Check any carpeting to make sure that it is firmly attached to the stairs. Fix any carpet that is loose or worn. Avoid having throw rugs at the top or bottom of the stairs. If you do have throw rugs, attach them to the floor with carpet tape. Make sure that you have a light switch at the top of the stairs and the bottom of the  stairs. If you do not have them, ask someone to add them for you. What else can I do to help prevent falls? Wear shoes that: Do not have high heels. Have rubber bottoms. Are comfortable and fit you well. Are closed at the toe. Do not wear sandals. If you use a stepladder: Make sure that it is fully opened. Do not climb a closed stepladder. Make sure that both sides of the stepladder are locked into place. Ask someone to hold it for you, if possible. Clearly mark and make sure that you can see: Any grab bars or handrails. First and last steps. Where the edge of each step is. Use tools that help you move around (mobility aids) if they are needed. These include: Canes. Walkers. Scooters. Crutches. Turn on the lights when you go into a dark area. Replace any light bulbs as soon as they burn out. Set up your furniture so you have a clear path. Avoid moving your furniture around. If any of your floors are uneven, fix them. If there are any pets around you, be aware of where they are. Review your medicines with your doctor. Some medicines can make you feel dizzy. This can increase your chance of falling. Ask your doctor what other things that you can do to help prevent falls. This information is not intended to replace advice given to you by your health care provider. Make sure you discuss any questions you have with your health care provider. Document Released: 04/05/2009 Document Revised: 11/15/2015 Document Reviewed: 07/14/2014 Elsevier Interactive Patient Education  2017 [employer].

## 2022-02-20 ENCOUNTER — Other Ambulatory Visit: Payer: Self-pay

## 2022-02-20 NOTE — Patient Outreach (Signed)
[facility]) Care Management  02/20/2022  Debra Saunders 11-06-1945 [id number]    Case Transfer  Pt will be followed for care coordination by [place] [facility]. Assigned RN CM will outreach to patient.     Enzo Montgomery, RN,BSN,CCM [facility] [facility] Telephonic Care Management Coordinator Direct Phone: [phone] Toll Free: [phone] Fax: [phone]

## 2022-02-25 ENCOUNTER — Ambulatory Visit (INDEPENDENT_AMBULATORY_CARE_PROVIDER_SITE_OTHER): Payer: Medicare HMO

## 2022-02-25 DIAGNOSIS — I5032 Chronic diastolic (congestive) heart failure: Secondary | ICD-10-CM

## 2022-02-27 ENCOUNTER — Encounter: Payer: Medicare HMO | Admitting: Internal Medicine

## 2022-02-27 LAB — CUP PACEART REMOTE DEVICE CHECK
Battery Remaining Longevity: 54 mo
Battery Remaining Percentage: 57 %
Brady Statistic RA Percent Paced: 89 %
Brady Statistic RV Percent Paced: 99 %
Date Time Interrogation Session: 20230907033900
Implantable Lead Implant Date: 20150720
Implantable Lead Implant Date: 20150720
Implantable Lead Location: 753859
Implantable Lead Location: 753860
Implantable Lead Model: 5076
Implantable Lead Model: 5076
Implantable Pulse Generator Implant Date: 20150720
Lead Channel Impedance Value: 367 Ohm
Lead Channel Impedance Value: 533 Ohm
Lead Channel Pacing Threshold Amplitude: 0.7 V
Lead Channel Pacing Threshold Pulse Width: 0.4 ms
Lead Channel Setting Pacing Amplitude: 2 V
Lead Channel Setting Pacing Amplitude: 2.4 V
Lead Channel Setting Pacing Pulse Width: 0.4 ms
Lead Channel Setting Sensing Sensitivity: 2.5 mV
Pulse Gen Serial Number: 390330

## 2022-03-06 ENCOUNTER — Ambulatory Visit: Payer: Medicare HMO | Admitting: Internal Medicine

## 2022-03-06 ENCOUNTER — Encounter (HOSPITAL_COMMUNITY): Payer: Medicare HMO

## 2022-03-12 DIAGNOSIS — I10 Essential (primary) hypertension: Secondary | ICD-10-CM | POA: Diagnosis not present

## 2022-03-12 DIAGNOSIS — E119 Type 2 diabetes mellitus without complications: Secondary | ICD-10-CM | POA: Diagnosis not present

## 2022-03-12 DIAGNOSIS — Z87891 Personal history of nicotine dependence: Secondary | ICD-10-CM | POA: Diagnosis not present

## 2022-03-12 DIAGNOSIS — R42 Dizziness and giddiness: Secondary | ICD-10-CM | POA: Diagnosis not present

## 2022-03-18 ENCOUNTER — Encounter (HOSPITAL_COMMUNITY): Payer: Medicare HMO

## 2022-03-19 DIAGNOSIS — R251 Tremor, unspecified: Secondary | ICD-10-CM | POA: Diagnosis not present

## 2022-03-19 DIAGNOSIS — R569 Unspecified convulsions: Secondary | ICD-10-CM | POA: Diagnosis not present

## 2022-03-19 DIAGNOSIS — E1122 Type 2 diabetes mellitus with diabetic chronic kidney disease: Secondary | ICD-10-CM | POA: Diagnosis not present

## 2022-03-19 DIAGNOSIS — Z6841 Body Mass Index (BMI) 40.0 and over, adult: Secondary | ICD-10-CM | POA: Diagnosis not present

## 2022-03-19 DIAGNOSIS — R200 Anesthesia of skin: Secondary | ICD-10-CM | POA: Diagnosis not present

## 2022-03-19 DIAGNOSIS — R42 Dizziness and giddiness: Secondary | ICD-10-CM | POA: Diagnosis not present

## 2022-03-19 DIAGNOSIS — H539 Unspecified visual disturbance: Secondary | ICD-10-CM | POA: Diagnosis not present

## 2022-03-19 NOTE — Progress Notes (Signed)
Remote pacemaker transmission.   

## 2022-03-20 ENCOUNTER — Encounter: Payer: Self-pay | Admitting: Internal Medicine

## 2022-03-20 ENCOUNTER — Other Ambulatory Visit
Admission: RE | Admit: 2022-03-20 | Discharge: 2022-03-20 | Disposition: A | Discharge status: Home / Self Care | Payer: Medicare HMO | Source: Ambulatory Visit | Attending: Internal Medicine | Admitting: Internal Medicine

## 2022-03-20 ENCOUNTER — Ambulatory Visit: Payer: Medicare HMO | Attending: Internal Medicine | Admitting: Internal Medicine

## 2022-03-20 VITALS — BP 100/44 | HR 60 | Ht 60.0 in | Wt 194.0 lb

## 2022-03-20 DIAGNOSIS — D509 Iron deficiency anemia, unspecified: Secondary | ICD-10-CM | POA: Insufficient documentation

## 2022-03-20 DIAGNOSIS — Z79899 Other long term (current) drug therapy: Secondary | ICD-10-CM | POA: Diagnosis not present

## 2022-03-20 DIAGNOSIS — I428 Other cardiomyopathies: Secondary | ICD-10-CM

## 2022-03-20 DIAGNOSIS — Z950 Presence of cardiac pacemaker: Secondary | ICD-10-CM | POA: Diagnosis not present

## 2022-03-20 DIAGNOSIS — I4819 Other persistent atrial fibrillation: Secondary | ICD-10-CM

## 2022-03-20 DIAGNOSIS — I4719 Other supraventricular tachycardia: Secondary | ICD-10-CM

## 2022-03-20 DIAGNOSIS — I495 Sick sinus syndrome: Secondary | ICD-10-CM | POA: Diagnosis not present

## 2022-03-20 DIAGNOSIS — E876 Hypokalemia: Secondary | ICD-10-CM | POA: Diagnosis not present

## 2022-03-20 DIAGNOSIS — I5032 Chronic diastolic (congestive) heart failure: Secondary | ICD-10-CM | POA: Diagnosis not present

## 2022-03-20 DIAGNOSIS — I471 Supraventricular tachycardia: Secondary | ICD-10-CM

## 2022-03-20 LAB — BASIC METABOLIC PANEL
Anion gap: 13 (ref 5–15)
BUN: 46 mg/dL — ABNORMAL HIGH (ref 8–23)
CO2: 30 mmol/L (ref 22–32)
Calcium: 9.1 mg/dL (ref 8.9–10.3)
Chloride: 99 mmol/L (ref 98–111)
Creatinine, Ser: 1.46 mg/dL — ABNORMAL HIGH (ref 0.44–1.00)
GFR, Estimated: 37 mL/min — ABNORMAL LOW (ref >60)
Glucose, Bld: 121 mg/dL — ABNORMAL HIGH (ref 70–99)
Potassium: 3.1 mmol/L — ABNORMAL LOW (ref 3.5–5.1)
Sodium: 142 mmol/L (ref 135–145)

## 2022-03-20 LAB — FERRITIN: Ferritin: 519 ng/mL — ABNORMAL HIGH (ref 11–307)

## 2022-03-20 NOTE — Progress Notes (Signed)
Patient Care Team: [name], MD as PCP - General (Family Medicine) Wellington Hampshire, MD as PCP - Cardiology (Cardiology) Deboraha Sprang, MD as PCP - Electrophysiology (Cardiology) Thom Chimes, MD as Referring Physician (Allergy) Juanito Doom, MD as Referring Physician (Internal Medicine) Cammie Sickle, MD as Consulting Physician (Hematology and Oncology)   HPI  Debra Saunders is a 76 y.o. female Seen in followup for [place] pacemaker implanted 7/15 for bradycardia and chronotropic incompetence.  Atrial fib on Apixaban cx by iron deficiency anemia with a saturation of 10% 1/23 Hypertension with orthostatic hypotension   Chronic dyspnea.  Mostly nonambulatory.  Pulmonary hypertension has been improved with therapy with sildenafil and macitentan .  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  And was transferred from [facility] to [facility] for consideration of extraction.  It was felt that she was not a candidate that she was to be treated with 6 weeks of antibiotics.  Long term suppressive antibiotics have been initiated.  Hospitalization complicated by atrial fibrillation for which she underwent TEE guided cardioversion and therapy with amiodarone.  Hospitalized again with dizziness.  Found to be orthostatic.  Started on midodrine; no appreciable benefit.  Issues in the past with amiodarone have been concerns regarding lung disease underlying.      s. DATE TEST EF    4/19 LHC   No obstruc CAD  3/21 Echo  60-65% Pa PRESS 50   1/23 Echo  60-65% RVSP 51 RV failure  8/23 RHC  Nl filling pressures    Date Cr K Hgb TSH LFTs  2/21 1.16 3.8 8.3     1/23 1.29 3.7  8.6 (iron sat  9%) 0.054  16  8/23  3.3             Records and Results Reviewed   Past Medical History:  Diagnosis Date   Arthritis    Atrial fibrillation, persistent ([place])    a. s/p TEE-guided DCCV on 08/20/2017; b. 06/2021 Recurrent Afib-->amio/TEE/DCCV.   CAD (coronary artery disease)    a. 01/2012 Cath: nonobs dzs; b. 04/2014 MV: No ischemia; c. 09/2017 Cath: Minimal nonobs RCA dzs, otw nl cors.   Cardiorenal syndrome    a. 09/2017 Creat rose to 2.7 w/ diuresis-->HD x 2.   Chronic back pain    Chronic combined systolic (congestive) and diastolic (congestive) heart failure ([place])    a. Dx 2005 @ [facility];  b. 02/2014 Echo: EF 55-60%; c. 07/2017: EF 30-35%; d. 09/2017 Echo: EF 40-45%; e. 02/2018 Echo: EF 55-60%; f. 08/2019 Echo: EF 60-65%; g. 08/2020 RHC: RA 3, RV 30/1, PA 33/11 (20), PCWP 6; f. 08/2021 Echo: EF 60-65%, no rwma, GrIDD, nl RV fxn, RVSP 40.55mHg, mod dil LA, mod TR.   Chronic respiratory failure (HCC)    a. on home O2   Gastritis    History of intracranial hemorrhage    a. 6/14 described by neurosurgery as "small frontal posttraumatic SAH " - pt was on xarelto @ the time.   Hyperlipidemia    Hypertension    Interstitial lung disease (HCC)    Lipoma of back    Lymphedema    a. Chronic LLE edema.   NICM (nonischemic cardiomyopathy) [place]  a. 09/2017 Echo: EF 40-45%; b. 09/2017 Cath: minor, insignificant CAD; c. 02/2018 Echo: EF 55-60%, Gr1 DD; d. 08/2019 Echo: EF 60-65%, Gr2 DD.   Obesity    PAH (pulmonary artery hypertension) ([place])    a. 09/2017 Echo: PASP 43mHg; b. 09/2017 RHC: RA 23, RV 84/13, PA 84/29, PCWP 20, CO 3.89, CI 1.89. LVEDP 18; c. 08/2020 RHC: RA 3, RV 30/1, PA 33/11 (20), PCWP 6. CO/CI 6.18/3.36. PVR 2.3 WU.   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 06/2021. Veg on PPM lead. Not candidate for extraction-->s/p 6 wks abx.   Sepsis with metabolic encephalopathy ([place]    Sleep apnea    Streptococcal bacteremia    Stroke [facility] 2014    [place]oid [place] [place] 01/19[place]   Symptomatic bradycardia    a. s/p BSX PPM.   Thyroid nodule    Type II diabetes mellitus ([place]    Urinary incontinence     Past Surgical History:  Procedure Laterality Date   ABDOMINAL HYSTERECTOMY  1969   BREAST BIOPSY Right 10/28/2019   [place]    [place]IAC [place]  [place]   [place] No obstructive CAD: Only 20% ostial left circumflex.   CARDIOVERSION N/A 10/12/2017   Procedure: CARDIOVERSION;  Surgeon: GMinna Merritts MD;  [facility];  Service: Cardiovascular;  Laterality: N/A;   CARDIOVERSION N/A 07/04/2021   Procedure: CARDIOVERSION;  Surgeon: MLarey Dresser MD;  [facility];  Service: Cardiovascular;  Laterality: N/A;   CATARACT EXTRACTION W/PHACO Right 09/19/2020   Procedure: CATARACT EXTRACTION PHACO AND INTRAOCULAR LENS PLACEMENT (IOC) RIGHT DIABETIC 3.95 00:43.0 9.2%;  Surgeon: BLeandrew Koyanagi MD;  [place]  Service: Ophthalmology;  Laterality: Right;  Pt requests to be last sleep apnea   CATARACT EXTRACTION W/PHACO Left 10/03/2020   Procedure: CATARACT EXTRACTION PHACO AND INTRAOCULAR LENS PLACEMENT ([place] DIABETIC;  Surgeon: BLeandrew Koyanagi MD;  [place]  Service: Ophthalmology;  Laterality: Left;  3.25 0:59.3 5.5%   CHOLECYSTECTOMY     COLONOSCOPY WITH PROPOFOL N/A 09/10/2018   Procedure: COLONOSCOPY WITH PROPOFOL;  Surgeon: EManya Silvas MD;  [facility];  Service: Endoscopy;  Laterality: N/A;   CYST REMOVAL TRUNK  2002   BACK   DIALYSIS/PERMA CATHETER INSERTION N/A 10/06/2017   Procedure: DIALYSIS/PERMA CATHETER INSERTION;  Surgeon: SKatha Cabal MD;  [place];  Service: Cardiovascular;  Laterality: N/A;   INSERT / REPLACE / REMOVE PACEMAKER     lipoma removal  2014   back   PACEMAKER INSERTION  2015   [employer] dual chamber pacemaker implanted by Dr KCaryl Comesfor  symptomatic bradycardia   PERMANENT PACEMAKER INSERTION N/A 01/09/2014   Procedure: PERMANENT PACEMAKER INSERTION;  Surgeon: SDeboraha Sprang MD;  [facility];  Service: Cardiovascular;  Laterality: N/A;   RIGHT HEART CATH N/A 04/19/2018   Procedure: RIGHT HEART CATH;  Surgeon: MLarey Dresser MD;  [place];  Service: Cardiovascular;  Laterality: N/A;   RIGHT HEART CATH N/A 08/30/2020   Procedure: RIGHT HEART CATH;  Surgeon: MLarey Dresser MD;  [place];  Service: Cardiovascular;  Laterality: N/A;   RIGHT HEART CATH N/A 02/11/2022   Procedure: RIGHT HEART CATH;  Surgeon: MLarey Dresser MD;  [place] [facility];  Service: Cardiovascular;  Laterality: N/A;   RIGHT/LEFT HEART CATH AND CORONARY ANGIOGRAPHY  N/A 10/19/2017   Procedure: RIGHT/LEFT HEART CATH AND CORONARY ANGIOGRAPHY;  Surgeon: Wellington Hampshire, MD;  Location: [place] CV LAB;  Service: Cardiovascular;  Laterality: N/A;   TEE WITHOUT CARDIOVERSION N/A 08/20/2017   Procedure: TRANSESOPHAGEAL ECHOCARDIOGRAM (TEE) & Direct current cardioversion;  Surgeon: Minna Merritts, MD;  [facility];  Service: Cardiovascular;  Laterality: N/A;   TEE WITHOUT CARDIOVERSION N/A 06/28/2021   Procedure: TRANSESOPHAGEAL ECHOCARDIOGRAM (TEE);  Surgeon: Minna Merritts, MD;  Location: [facility] ORS;  Service: Cardiovascular;  Laterality: N/A;   TEE WITHOUT CARDIOVERSION N/A 07/04/2021   Procedure: TRANSESOPHAGEAL ECHOCARDIOGRAM (TEE);  Surgeon: Larey Dresser, MD;  [facility];  Service: Cardiovascular;  Laterality: N/A;   TRIGGER FINGER RELEASE      Current Meds  Medication Sig   acetaminophen (TYLENOL) 325 MG tablet Take 325-650 mg by mouth every 6 (six) hours as needed for mild pain.   albuterol (ACCUNEB) 0.63 MG/3ML nebulizer solution Take 1 ampule by nebulization every 6 (six) hours as needed for wheezing.   amiodarone (PACERONE) 200 MG tablet Take 200 mg by mouth  daily.   apixaban (ELIQUIS) 5 MG TABS tablet TAKE 1 TABLET BY MOUTH TWICE DAILY. APPOINTMENT NEEDED FOR FURTHER REFILLS.   cefadroxil (DURICEF) 500 MG capsule Take 1 capsule (500 mg total) by mouth 2 (two) times daily.   Difluprednate 0.05 % EMUL Place 1 drop into both eyes 2 (two) times daily.   gabapentin (NEURONTIN) 300 MG capsule Take 2 capsules (600 mg total) by mouth 3 (three) times daily.   macitentan (OPSUMIT) 10 MG tablet Take 1 tablet (10 mg total) by mouth daily.   meclizine (ANTIVERT) 25 MG tablet Take 1 tablet (25 mg total) by mouth 3 (three) times daily as needed for dizziness.   Menthol, Topical Analgesic, (BIOFREEZE EX) Apply 1 application topically 4 (four) times daily as needed (for pain).   metolazone (ZAROXOLYN) 2.5 MG tablet Take 2.5 mg by mouth every Monday, Wednesday, and Friday.   potassium chloride SA (KLOR-CON M) 20 MEQ tablet Take 40 mEq by mouth 2 (two) times daily.   rosuvastatin (CRESTOR) 10 MG tablet Take 1 tablet (10 mg total) by mouth daily.   Selexipag (UPTRAVI) 1600 MCG TABS Take 1 tablet (1,600 mcg total) by mouth 2 (two) times daily.   sildenafil (REVATIO) 20 MG tablet Take 1 tablet (20 mg total) by mouth 3 (three) times daily.   torsemide (DEMADEX) 20 MG tablet Take 3 tablets (60 mg total) by mouth 2 (two) times daily. Patient takes 3 tablets by mouth twice a day.    Allergies  Allergen Reactions   Aspirin Anaphylaxis and Hives    Pt states that she is unable to take because she had bleeding in her brain   Other Other (See Comments)    Pt states that she is unable to take blood thinners because she had bleeding in her brain; Blood Thinners-[facility] at [facility] (Xarelto)      Review of Systems negative except from HPI and PMH  Physical Exam BP (!) 100/44 (BP Location: Left Arm, Patient Position: Sitting, Cuff Size: Normal)   Pulse 60   Ht 5' (1.524 m)   Wt 194 lb (88 kg)   SpO2 96% Comment: 4 Liters of oxygen  BMI 37.89 kg/m  Well developed  and well nourished in no acute distress wearing oxygen HENT normal Neck supple with JVP-flat Clear Device pocket well healed; without hematoma or erythema.  There is no tethering  Regular  rate and rhythm,  gallop 2/6 murmur Abd-soft with active BS No Clubbing cyanosis 3+L edema Skin-warm and dry A & Oriented  Grossly normal sensory and motor function  ECG AV pacing at 60  CrCl cannot be calculated (Patient's most recent lab result is older than the maximum 21 days allowed.).   Assessment and  Plan  Chronotropic incompetence   Sinus bradycardia  Atrial fibrillation-persistent requiring cardioversion   Atrial tachycardia non sustained with rates 200 msec (ie nonsustained AFib)   Dyspnea on exertion   Cardiac implantable device infection-Streptococcus-chronic  Pacemaker [employer]      HFpEF   Hypertension and orthostatic hypotension   Anemia-iron deficiency  Hyperthyroidism  High risk medication surveillance-amiodarone  End-of-life discussion   Tolerating antibiotics for her vegetation on her lead.  Continue.  Infrequent atrial arrhythmias, will discuss with Dr. DM about decreasing her amiodarone from 200 mg 7 days a week--5 days a week  Blood pressure is low; denies significant orthostatic lightheadedness somewhat to my surprise.  We will recheck her ferritin to see if she still needs more iron  Last potassium was borderline low.  We will recheck today.  Suspect will need more potassium repletion    Current medicines are reviewed at length with the patient today .  The patient does not  have concerns regarding medicines.

## 2022-03-20 NOTE — Patient Instructions (Signed)
Medication Instructions:  - Your physician recommends that you continue on your current medications as directed. Please refer to the Current Medication list given to you today.  *If you need a refill on your cardiac medications before your next appointment, please call your pharmacy*   Lab Work: - Your physician recommends that you have lab work today:   BMP/ [place] Entrance at [facility] 1st desk on the right to check in (REGISTRATION)  Lab hours: Monday- Friday (7:30 am- 5:30 pm)   If you have labs (blood work) drawn today and your tests are completely normal, you will receive your results only by: MyChart Message (if you have MyChart) OR A paper copy in the mail If you have any lab test that is abnormal or we need to change your treatment, we will call you to review the results.   Testing/Procedures: - none ordered   Follow-Up: At [facility], you and your health needs are our priority.  As part of our continuing mission to provide you with exceptional heart care, we have created designated Provider Care Teams.  These Care Teams include your primary Cardiologist (physician) and Advanced Practice Providers (APPs -  Physician Assistants and Nurse Practitioners) who all work together to provide you with the care you need, when you need it.  We recommend signing up for the patient portal called "MyChart".  Sign up information is provided on this After Visit Summary.  MyChart is used to connect with patients for Virtual Visits (Telemedicine).  Patients are able to view lab/test results, encounter notes, upcoming appointments, etc.  Non-urgent messages can be sent to your provider as well.   To learn more about what you can do with MyChart, go to [email or web address].    Your next appointment:   1 year(s)  The format for your next appointment:   In Person  Provider:   Virl Axe, MD    Other Instructions N/a  Important Information About  [place]

## 2022-03-26 ENCOUNTER — Telehealth: Payer: Self-pay | Admitting: Internal Medicine

## 2022-03-26 DIAGNOSIS — Z79899 Other long term (current) drug therapy: Secondary | ICD-10-CM

## 2022-03-26 DIAGNOSIS — E876 Hypokalemia: Secondary | ICD-10-CM

## 2022-03-26 MED ORDER — SPIRONOLACTONE 25 MG PO TABS: ORAL_TABLET | ORAL | Status: DC

## 2022-03-26 NOTE — Telephone Encounter (Addendum)
No MD orders received from Dr. Caryl Comes last week regarding low potassium.  I have reviewed results verbally with Dr. Caryl Comes.  Orders received to: 1) Take potassium 20 meq - 4 tablets (80 meq) BID x 3 days - then resume 2 tablets (40 meq) BID  2) START Aldactone 25 mg: - take 0.5 tablet (12.5 mg) QD  3) REPEAT a BMP in 2 weeks  I called the patient's primary phone # on file, which is for her daughter, [name].  I have reviewed the patient's lab results with [name] (ok per DPR). I have advised her of Dr. Olin Pia recommendations as stated above. Per [name], the patient had been on aldactone previously, but could not remember when this was stopped.   I inquired if I should also call the patient to discuss these results/ recommendations, and per [facility], that is ok. She advised the patient may recall when aldactone was stopped.   I advised [name], I will review MD recommendations with the patient prior to sending anything to her pharmacy. [name] states she will give the patient a wake up call once she gets off the phone so she can start moving around.  I inquired if I waited until ~ 11:00 am to call, would that be best? Per [name], it would be best to wait until around that time to call the patient.   She was appreciative of the call today.

## 2022-03-26 NOTE — Telephone Encounter (Signed)
I called and spoke with the patient. I have advised her of her lab results and Dr. Olin Pia recommendations as stated below:  1) Take potassium 20 meq - 4 tablets (80 meq) BID x 3 days - then resume 2 tablets (40 meq) BID   2) START Aldactone 25 mg: - take 0.5 tablet (12.5 mg) QD  3) Repeat a BMP in 2 weeks   The patient voices understanding of these results and recommendations and is agreeable. She advised that she has Spironolactone still at home from when she was previously on this. I advised her I will not send another RX at this time.  She is aware I will be looking out for her repeat lab in 2 weeks and if I do not see this resulted at that time, I will call to remind her to go to the [facility].  The patient again voiced understanding and is agreeable.  She was appreciative for the call back today.

## 2022-03-26 NOTE — Telephone Encounter (Signed)
Emily Filbert, RN  03/20/2022  5:17 PM EDT Back to Top    Preliminary results reviewed. Forwarded to MD desktop for review and signature. K+ 3.1 (has previously run low)   Secure chat to Dr. Caryl Comes.

## 2022-04-01 MED FILL — Iron Sucrose Inj 20 MG/ML (Fe Equiv): INTRAVENOUS | Qty: 10 | Status: AC

## 2022-04-02 ENCOUNTER — Encounter: Payer: Self-pay | Admitting: Internal Medicine

## 2022-04-02 ENCOUNTER — Inpatient Hospital Stay: Payer: Medicare HMO

## 2022-04-02 ENCOUNTER — Inpatient Hospital Stay: Payer: Medicare HMO | Attending: Internal Medicine

## 2022-04-02 ENCOUNTER — Inpatient Hospital Stay (HOSPITAL_BASED_OUTPATIENT_CLINIC_OR_DEPARTMENT_OTHER): Payer: Medicare HMO | Admitting: Internal Medicine

## 2022-04-02 DIAGNOSIS — E876 Hypokalemia: Secondary | ICD-10-CM | POA: Diagnosis not present

## 2022-04-02 DIAGNOSIS — E611 Iron deficiency: Secondary | ICD-10-CM

## 2022-04-02 DIAGNOSIS — R5383 Other fatigue: Secondary | ICD-10-CM | POA: Diagnosis not present

## 2022-04-02 DIAGNOSIS — D631 Anemia in chronic kidney disease: Secondary | ICD-10-CM | POA: Insufficient documentation

## 2022-04-02 DIAGNOSIS — Z7901 Long term (current) use of anticoagulants: Secondary | ICD-10-CM | POA: Diagnosis not present

## 2022-04-02 DIAGNOSIS — N183 Chronic kidney disease, stage 3 unspecified: Secondary | ICD-10-CM | POA: Insufficient documentation

## 2022-04-02 DIAGNOSIS — I4891 Unspecified atrial fibrillation: Secondary | ICD-10-CM | POA: Insufficient documentation

## 2022-04-02 DIAGNOSIS — Z87891 Personal history of nicotine dependence: Secondary | ICD-10-CM | POA: Diagnosis not present

## 2022-04-02 DIAGNOSIS — Z86718 Personal history of other venous thrombosis and embolism: Secondary | ICD-10-CM | POA: Diagnosis not present

## 2022-04-02 DIAGNOSIS — E538 Deficiency of other specified B group vitamins: Secondary | ICD-10-CM | POA: Insufficient documentation

## 2022-04-02 DIAGNOSIS — Z803 Family history of malignant neoplasm of breast: Secondary | ICD-10-CM | POA: Insufficient documentation

## 2022-04-02 LAB — CBC WITH DIFFERENTIAL/PLATELET
Abs Immature Granulocytes: 0.01 10*3/uL (ref 0.00–0.07)
Basophils Absolute: 0 10*3/uL (ref 0.0–0.1)
Basophils Relative: 1 %
Eosinophils Absolute: 0.2 10*3/uL (ref 0.0–0.5)
Eosinophils Relative: 4 %
HCT: 32.9 % — ABNORMAL LOW (ref 36.0–46.0)
Hemoglobin: 10 g/dL — ABNORMAL LOW (ref 12.0–15.0)
Immature Granulocytes: 0 %
Lymphocytes Relative: 28 %
Lymphs Abs: 1.1 10*3/uL (ref 0.7–4.0)
MCH: 26.8 pg (ref 26.0–34.0)
MCHC: 30.4 g/dL (ref 30.0–36.0)
MCV: 88.2 fL (ref 80.0–100.0)
Monocytes Absolute: 0.4 10*3/uL (ref 0.1–1.0)
Monocytes Relative: 9 %
Neutro Abs: 2.4 10*3/uL (ref 1.7–7.7)
Neutrophils Relative %: 58 %
Platelets: 163 10*3/uL (ref 150–400)
RBC: 3.73 MIL/uL — ABNORMAL LOW (ref 3.87–5.11)
RDW: 12.9 % (ref 11.5–15.5)
WBC: 4 10*3/uL (ref 4.0–10.5)
nRBC: 0 % (ref 0.0–0.2)

## 2022-04-02 LAB — IRON AND TIBC
Iron: 55 ug/dL (ref 28–170)
Saturation Ratios: 17 % (ref 10.4–31.8)
TIBC: 325 ug/dL (ref 250–450)
UIBC: 270 ug/dL

## 2022-04-02 LAB — BASIC METABOLIC PANEL
Anion gap: 8 (ref 5–15)
BUN: 34 mg/dL — ABNORMAL HIGH (ref 8–23)
CO2: 28 mmol/L (ref 22–32)
Calcium: 8.6 mg/dL — ABNORMAL LOW (ref 8.9–10.3)
Chloride: 101 mmol/L (ref 98–111)
Creatinine, Ser: 1.25 mg/dL — ABNORMAL HIGH (ref 0.44–1.00)
GFR, Estimated: 44 mL/min — ABNORMAL LOW (ref >60)
Glucose, Bld: 116 mg/dL — ABNORMAL HIGH (ref 70–99)
Potassium: 3.4 mmol/L — ABNORMAL LOW (ref 3.5–5.1)
Sodium: 137 mmol/L (ref 135–145)

## 2022-04-02 LAB — FERRITIN: Ferritin: 473 ng/mL — ABNORMAL HIGH (ref 11–307)

## 2022-04-02 LAB — VITAMIN B12: Vitamin B-12: 231 pg/mL (ref 180–914)

## 2022-04-02 MED ORDER — CYANOCOBALAMIN 1000 MCG/ML IJ SOLN
1000.0000 ug | Freq: Once | INTRAMUSCULAR | Status: AC
  Administered 2022-04-02: 1000 ug via INTRAMUSCULAR
  Filled 2022-04-02: qty 1

## 2022-04-02 NOTE — Assessment & Plan Note (Addendum)
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 CKD stage III  currently s/p Venofer. [name] JAN 2023 [sat 9%; ferrtin-700+- hospital/sepsis]; Today hemoglobin is 10  improved from 8; HOLD  venofer today. [SEP 2023- ferritin 400; iron sat June 14%]. #Recommend gentle iron 1 pill a day; should not upset your stomach or cause constipation.   # Mild Hypokalemia- K 3.4- continue Kdur BID.   # Etiology of anemia-likely CKD stage III/ [chronic diarrhea/malabsorption]-   #History of DVT/s/p IVC filter; ?  A. Fib-Eliquis-STABLE>   #Borderline low B12-230; s/p B12 injection.; proceed with injection q 4 months.    # DISPOSITION: # proceed  b12 today; HOLD  venofer today # Follow up in 4 months-MD; labs-- cbc;bmp; irons tudies/ferritin; B12 levels-possible Venofer / B12 injection- Dr.B  Cc; Tammi Klippel

## 2022-04-02 NOTE — Progress Notes (Signed)
[place] NOTE  Patient Care Team: [name], MD as PCP - General (Family Medicine) Wellington Hampshire, MD as PCP - Cardiology (Cardiology) Deboraha Sprang, MD as PCP - Electrophysiology (Cardiology) Thom Chimes, MD as Referring Physician (Allergy) Juanito Doom, MD as Referring Physician (Internal Medicine) Cammie Sickle, MD as Consulting Physician (Hematology and Oncology) Anemia CHIEF COMPLAINTS/PURPOSE OF CONSULTATION: Anemia   HEMATOLOGY HISTORY  # CHORNIC IRON DEFICIENCY ANEMIA [BL 10-11]-Jan today 21 PCP Hb-8.8; Antonieta Pert sat-5%; ferritin 11] EGD-; colonoscopy MARCH 2020 -polyp [[facility]-hold further work-up secondary to cardiac issues]; remote-EGD-?;  February 2021-LDH/haptoglobin normal; kappa lambda light chain ratio/M panel normal;  on Venofer; November 2020-CT scan question early cirrhosis/no splenomegaly.  No acute process; MARCH 2021- STOOL OCCULT x3- NEGATIVE.   #B12 deficiency-IM every 3 to 4 months  #2014 -question blood transfusion  [patient admitted for stroke]; CHF chronic -[ 2004 on 4 lits; Dr.Arida/Klein]; CT scan chest 2021 -no adenopathy; fibrotic changes; walker; CKD stage III-   #Incidental right axillary mass-s/p US biopsy [PCP] -negative for malignancy.  HISTORY OF PRESENTING ILLNESS: Patient in wheelchair on [place] O2.  Alone.  Debra Saunders 76 y.o.  female anemia/likely secondary to ?CKD stage III is here for follow-up.  Complains of chronic fatigue. No blood in stools ; no nausea vomiting.  Continues to be compliant with her Eliquis.  Patient continues with oxygen.   Recently followed up by cardiology.   Review of Systems  Constitutional:  Positive for malaise/fatigue. Negative for chills, diaphoresis, fever and weight loss.  HENT:  Negative for nosebleeds and sore throat.   Eyes:  Negative for double vision.  Respiratory:  Positive for shortness of breath. Negative for cough, hemoptysis, sputum production and  wheezing.   Cardiovascular:  Negative for chest pain, palpitations, orthopnea and leg swelling.  Gastrointestinal:  Negative for abdominal pain, blood in stool, constipation, heartburn, melena, nausea and vomiting.  Genitourinary:  Negative for dysuria, frequency and urgency.  Musculoskeletal:  Negative for back pain and joint pain.  Skin: Negative.  Negative for itching and rash.  Neurological:  Negative for dizziness, tingling, focal weakness, weakness and headaches.  Endo/Heme/Allergies:  Does not bruise/bleed easily.  Psychiatric/Behavioral:  Negative for depression. The patient is not nervous/anxious and does not have insomnia.     MEDICAL HISTORY:  Past Medical History:  Diagnosis Date   Arthritis    Atrial fibrillation, persistent ([place])    a. s/p TEE-guided DCCV on 08/20/2017; b. 06/2021 Recurrent Afib-->amio/TEE/DCCV.   CAD (coronary artery disease)    a. 01/2012 Cath: nonobs dzs; b. 04/2014 MV: No ischemia; c. 09/2017 Cath: Minimal nonobs RCA dzs, otw nl cors.   Cardiorenal syndrome    a. 09/2017 Creat rose to 2.7 w/ diuresis-->HD x 2.   Chronic back pain    Chronic combined systolic (congestive) and diastolic (congestive) heart failure ([place])    a. Dx 2005 @ [facility];  b. 02/2014 Echo: EF 55-60%; c. 07/2017: EF 30-35%; d. 09/2017 Echo: EF 40-45%; e. 02/2018 Echo: EF 55-60%; f. 08/2019 Echo: EF 60-65%; g. 08/2020 RHC: RA 3, RV 30/1, PA 33/11 (20), PCWP 6; f. 08/2021 Echo: EF 60-65%, no rwma, GrIDD, nl RV fxn, RVSP 40.43mHg, mod dil LA, mod TR.   Chronic respiratory failure (HCC)    a. on home O2   Gastritis    History of intracranial hemorrhage    a. 6/14 described by neurosurgery as "small frontal posttraumatic SAH " - pt was on xarelto @  the time.   Hyperlipidemia    Hypertension    Interstitial lung disease (HCC)    Lipoma of back    Lymphedema    a. Chronic LLE edema.   NICM (nonischemic cardiomyopathy) ([place])    a. 09/2017 Echo: EF 40-45%; b. 09/2017 Cath: minor, insignificant CAD; c.  02/2018 Echo: EF 55-60%, Gr1 DD; d. 08/2019 Echo: EF 60-65%, Gr2 DD.   Obesity    PAH (pulmonary artery hypertension) ([place])    a. 09/2017 Echo: PASP 97mHg; b. 09/2017 RHC: RA 23, RV 84/13, PA 84/29, PCWP 20, CO 3.89, CI 1.89. LVEDP 18; c. 08/2020 RHC: RA 3, RV 30/1, PA 33/11 (20), PCWP 6. CO/CI 6.18/3.36. PVR 2.3 WU.   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 06/2021. Veg on PPM lead. Not candidate for extraction-->s/p 6 wks abx.   Sepsis [place] encephalopathy ([place]    Sleep apnea    Streptococcal bacteremia    Stroke ([facility] 2014   [place]   Symptomatic bradycardia    a. s/p BSX PPM.   Thyroid nodule    Type II diabetes mellitus ([place]    Urinary incontinence     SURGICAL HISTORY: Past Surgical History:  Procedure Laterality Date   ABDOMINAL HYSTERECTOMY  1969   BREAST BIOPSY Right 10/28/2019   lymph node bx, [place], [place]    [place]IAC [place]  [place]   [place] obstructive CAD: Only 20% ostial left circumflex.   CARDIOVERSION N/A 10/12/2017   Procedure: CARDIOVERSION;  Surgeon: GMinna Merritts MD;  [facility];  Service: Cardiovascular;  Laterality: N/A;   CARDIOVERSION N/A 07/04/2021   Procedure: CARDIOVERSION;  Surgeon: MLarey Dresser MD;  [id number];  Service: Cardiovascular;  Laterality: N/A;   CATARACT EXTRACTION W/PHACO Right 09/19/2020   Procedure: CATARACT EXTRACTION PHACO AND INTRAOCULAR LENS PLACEMENT (IOC) RIGHT DIABETIC 3.95 00:43.0 9.2%;  Surgeon: BLeandrew Koyanagi MD;  [place]  Service: Ophthalmology;  Laterality: Right;  Pt requests to be last sleep apnea   CATARACT EXTRACTION W/PHACO Left 10/03/2020   Procedure: CATARACT EXTRACTION PHACO AND INTRAOCULAR LENS PLACEMENT ([place] DIABETIC;  Surgeon: BLeandrew Koyanagi MD;  [place]  Service: Ophthalmology;  Laterality: Left;  3.25 0:59.3 5.5%    CHOLECYSTECTOMY     COLONOSCOPY WITH PROPOFOL N/A 09/10/2018   Procedure: COLONOSCOPY WITH PROPOFOL;  Surgeon: EManya Silvas MD;  [facility];  Service: Endoscopy;  Laterality: N/A;   CYST REMOVAL TRUNK  2002   BACK   DIALYSIS/PERMA CATHETER INSERTION N/A 10/06/2017   Procedure: DIALYSIS/PERMA CATHETER INSERTION;  Surgeon: SKatha Cabal MD;  [place];  Service: Cardiovascular;  Laterality: N/A;   INSERT / REPLACE / REMOVE PACEMAKER     lipoma removal  2014   back   PACEMAKER INSERTION  2015   [employer] dual chamber pacemaker implanted by Dr KCaryl Comesfor symptomatic bradycardia   PERMANENT PACEMAKER INSERTION N/A 01/09/2014   Procedure: PERMANENT PACEMAKER INSERTION;  Surgeon: SDeboraha Sprang MD;  [facility];  Service: Cardiovascular;  Laterality: N/A;   RIGHT HEART CATH N/A 04/19/2018   Procedure: RIGHT HEART CATH;  Surgeon: MLarey Dresser MD;  [place];  Service: Cardiovascular;  Laterality: N/A;   RIGHT HEART CATH N/A 08/30/2020   Procedure: RIGHT HEART CATH;  Surgeon: MLarey Dresser MD;  [place] [facility];  Service: Cardiovascular;  Laterality: N/A;   RIGHT HEART CATH N/A 02/11/2022   Procedure: RIGHT HEART CATH;  Surgeon: Larey Dresser, MD;  [place]: [place] LAB;  Service: Cardiovascular;  Laterality: N/A;   RIGHT/LEFT HEART CATH AND CORONARY ANGIOGRAPHY N/A 10/19/2017   Procedure: RIGHT/LEFT HEART CATH AND CORONARY ANGIOGRAPHY;  Surgeon: Wellington Hampshire, MD;  Location: [place] CV LAB;  Service: Cardiovascular;  Laterality: N/A;   TEE WITHOUT CARDIOVERSION N/A 08/20/2017   Procedure: TRANSESOPHAGEAL ECHOCARDIOGRAM (TEE) & Direct current cardioversion;  Surgeon: Minna Merritts, MD;  [facility];  Service: Cardiovascular;  Laterality: N/A;   TEE WITHOUT CARDIOVERSION N/A 06/28/2021   Procedure: TRANSESOPHAGEAL ECHOCARDIOGRAM (TEE);  Surgeon: Minna Merritts, MD;   Location[facility];  Service: Cardiovascular;  Laterality: N/A;   TEE WITHOUT CARDIOVERSION N/A 07/04/2021   Procedure: TRANSESOPHAGEAL ECHOCARDIOGRAM (TEE);  Surgeon: Larey Dresser, MD;  [facility];  Service: Cardiovascular;  Laterality: N/A;   TRIGGER FINGER RELEASE      SOCIAL HISTORY: Social History   Socioeconomic History   Marital status: Divorced    Spouse name: Not on file   Number of children: 4   Years of education: 12   Highest education level: High school graduate  Occupational History   Occupation: Retired- factory work    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 Has 4 children. No pets.      Work - Restaurant manager, fast food, retired      Diet - regular      02/14/2019: reports has Meals On Wheels delivered to her home;       Remote hx of smoking > 30 years; No alcohol-    Social Determinants of Health   Financial Resource Strain: Low Risk  (02/19/2022)   Overall Financial Resource Strain (CARDIA)    Difficulty of Paying Living Expenses: Not hard at all  Food Insecurity: No Food Insecurity (02/19/2022)   Hunger Vital Sign    Worried About Running Out of Food in the Last Year: Never true    Ran Out of Food in the Last Year: Never true  Transportation Needs: Unmet Transportation Needs (01/15/2022)   PRAPARE - Transportation    Lack of Transportation (Medical): Yes    Lack of Transportation (Non-Medical): Yes  Physical Activity: Inactive (01/05/2018)   Exercise Vital Sign    Days of Exercise per Week: 0 days    Minutes of Exercise per  Session: 0 min  Stress: No Stress Concern Present (02/19/2022)   [place]    Feeling of Stress : Not at all  Social Connections: Socially Isolated (02/19/2022)   Social Connection and Isolation Panel [NHANES]    Frequency of Communication with Friends and Family: More than three times a week    Frequency of Social Gatherings with Friends and Family: Once a week    Attends Religious Services: Never  Active Member of Clubs or Organizations: No    Attends Archivist Meetings: Never    Marital Status: Divorced  Human resources officer Violence: Not At Risk (02/19/2022)   Humiliation, Afraid, Rape, and Kick questionnaire    Fear of Current or Ex-Partner: No    Emotionally Abused: No    Physically Abused: No    Sexually Abused: No    FAMILY HISTORY: Family History  Problem Relation Age of Onset   Heart disease Mother    Hypertension Mother    COPD Mother        was a smoker   Thyroid disease Mother    Hypertension Father    Hypertension Sister    Thyroid disease Sister    Breast cancer Sister    Hypertension Sister    Thyroid disease Sister    Bone cancer Sister    Stroke Son    Breast cancer Maternal Aunt    Kidney disease Neg Hx    Bladder Cancer Neg Hx     ALLERGIES:  is allergic to aspirin and other.  MEDICATIONS:  Current Outpatient Medications  Medication Sig Dispense Refill   acetaminophen (TYLENOL) 325 MG tablet Take 325-650 mg by mouth every 6 (six) hours as needed for mild pain.     albuterol (ACCUNEB) 0.63 MG/3ML nebulizer solution Take 1 ampule by nebulization every 6 (six) hours as needed for wheezing.     amiodarone (PACERONE) 200 MG tablet Take 200 mg by mouth daily.     apixaban (ELIQUIS) 5 MG TABS tablet TAKE 1 TABLET BY MOUTH TWICE DAILY. APPOINTMENT NEEDED FOR FURTHER REFILLS. 180 tablet 0   cefadroxil (DURICEF) 500 MG capsule Take 1 capsule (500 mg total) by mouth 2 (two) times daily.  180 capsule 3   Difluprednate 0.05 % EMUL Place 1 drop into both eyes 2 (two) times daily.     gabapentin (NEURONTIN) 300 MG capsule Take 2 capsules (600 mg total) by mouth 3 (three) times daily. 540 capsule 1   macitentan (OPSUMIT) 10 MG tablet Take 1 tablet (10 mg total) by mouth daily. 90 tablet 3   meclizine (ANTIVERT) 25 MG tablet Take 1 tablet (25 mg total) by mouth 3 (three) times daily as needed for dizziness. 30 tablet 1   Menthol, Topical Analgesic, (BIOFREEZE EX) Apply 1 application topically 4 (four) times daily as needed (for pain).     metolazone (ZAROXOLYN) 2.5 MG tablet Take 2.5 mg by mouth every Monday, Wednesday, and Friday.     midodrine (PROAMATINE) 5 MG tablet Take 5 mg by mouth 3 (three) times daily.     potassium chloride SA (KLOR-CON M) 20 MEQ tablet Take 40 mEq by mouth 2 (two) times daily.     rosuvastatin (CRESTOR) 10 MG tablet Take 1 tablet (10 mg total) by mouth daily. 90 tablet 3   Selexipag (UPTRAVI) 1600 MCG TABS Take 1 tablet (1,600 mcg total) by mouth 2 (two) times daily. 60 tablet 11   sildenafil (REVATIO) 20 MG tablet Take 1 tablet (20 mg total) by mouth 3 (three) times daily. 10 tablet 0   spironolactone (ALDACTONE) 25 MG tablet Take 0.5 tablet (12.5 mg) by mouth once daily     torsemide (DEMADEX) 20 MG tablet Take 3 tablets (60 mg total) by mouth 2 (two) times daily. Patient takes 3 tablets by mouth twice a day. 180 tablet 6   benzonatate (TESSALON) 200 MG capsule Take 1 capsule (200 mg total) by mouth 3 (three) times daily as needed  for cough. (Patient not taking: Reported on 01/30/2022) 20 capsule 0   No current facility-administered medications for this visit.   Facility-Administered Medications Ordered in Other Visits  Medication Dose Route Frequency Provider Last Rate Last Admin   cyanocobalamin (VITAMIN B12) injection 1,000 mcg  1,000 mcg Intramuscular Once Cammie Sickle, MD          PHYSICAL EXAMINATION:   Vitals:   04/02/22 1324  BP:  (!) 123/95  Pulse: (!) 59  Resp: (!) 21  Temp: (!) 97.2 F (36.2 C)  SpO2: 95%   Filed Weights   04/02/22 1324  Weight: 196 lb (88.9 kg)    Physical Exam Constitutional:      Comments: Patient in wheelchair.  Alone.  She is on O2 nasal cannula.  HENT:     Head: Normocephalic and atraumatic.     Mouth/Throat:     Pharynx: No oropharyngeal exudate.  Eyes:     Pupils: Pupils are equal, round, and reactive to light.  Cardiovascular:     Rate and Rhythm: Normal rate and regular rhythm.  Pulmonary:     Effort: No respiratory distress.     Breath sounds: No wheezing.     Comments: Decreased air entry bilaterally.  No wheeze or crackles. Abdominal:     General: Bowel sounds are normal. There is no distension.     Palpations: Abdomen is soft. There is no mass.     Tenderness: There is no abdominal tenderness. There is no guarding or rebound.  Musculoskeletal:        General: No tenderness. Normal range of motion.     Cervical back: Normal range of motion and neck supple.  Skin:    General: Skin is warm.  Neurological:     Mental Status: She is alert and oriented to person, place, and time.  Psychiatric:        Mood and Affect: Affect normal.     LABORATORY DATA:  I have reviewed the data as listed Lab Results  Component Value Date   WBC 4.0 04/02/2022   HGB 10.0 (L) 04/02/2022   HCT 32.9 (L) 04/02/2022   MCV 88.2 04/02/2022   PLT 163 04/02/2022   Recent Labs    06/25/21 1350 06/25/21 2243 06/28/21 0500 06/29/21 1254 06/29/21 1608 06/30/21 0522 08/29/21 1037 09/26/21 1306 10/07/21 1735 11/25/21 0346 12/02/21 1254 01/28/22 1540 02/11/22 1037 02/11/22 1302 02/11/22 1303 03/20/22 1219 04/02/22 1229  NA 126*   < > 134*   < >  --    < > 139 140   < > 142   < > 140 142   < > 142 142 137  K 4.9   < > 3.8   < >  --    < > 4.7 2.9*   < > 3.9   < > 3.2* 3.7   < > 3.3* 3.1* 3.4*  CL 98   < > 100   < >  --    < > 99 101   < > 104   < > 95* 102  --   --  99 101   CO2 20*   < > 30   < >  --    < > 27 31   < > 29   < > 36* 29  --   --  30 28  GLUCOSE 171*   < > 242*   < >  --    < > 113* 110*   < >  129*   < > 106* 124*  --   --  121* 116*  BUN 74*   < > 76*   < >  --    < > 27 14   < > 20   < > 59* 51*  --   --  46* 34*  CREATININE 3.11*   < > 1.46*   < >  --    < > 1.07* 1.19*   < > 1.23*   < > 1.66* 1.71*  --   --  1.46* 1.25*  CALCIUM 7.9*   < > 7.5*   < >  --    < > 9.3 8.9   < > 9.2   < > 9.1 9.3  --   --  9.1 8.6*  GFRNONAA 15*   < > 37*   < >  --    < >  --  47*   < >  --    < > 31* 30*  --   --  37* 44*  PROT 6.2*   < > 6.3*   < > 6.8   < > 7.2 6.9  --  6.6  --  7.1  --   --   --   --   --   ALBUMIN 2.2*   < > 2.4*   < > 2.3*   < > 3.8 3.3*  --   --   --  3.8  --   --   --   --   --   AST 21   < > 13*   < > 13*   < > 19 13*  --  12  --  13*  --   --   --   --   --   ALT 13   < > 12   < > 12   < > 22 9  --  10  --  12  --   --   --   --   --   ALKPHOS 126   < > 123   < > 122   < > 266* 93  --   --   --  97  --   --   --   --   --   BILITOT 3.0*   < > 1.0   < > 0.9   < > 0.2 0.6  --  0.4  --  0.9  --   --   --   --   --   BILIDIR 1.7*  --  0.3*  --  0.3*  --   --   --   --   --   --   --   --   --   --   --   --   IBILI 1.3*  --  0.7  --  0.6  --   --   --   --   --   --   --   --   --   --   --   --    < > = values in this interval not displayed.     No results found.   Iron deficiency # Chronic Anemia [BL10-11]-iron deficiency/? CKD stage III  currently s/p Venofer. [name] JAN 2023 [sat 9%; ferrtin-700+- hospital/sepsis]; Today hemoglobin is 10  improved from 8; HOLD  venofer today. [SEP 2023- ferritin 400; iron sat June 14%]. #Recommend gentle iron 1 pill a day; should not upset  your stomach or cause constipation.   # Mild Hypokalemia- K 3.4- continue Kdur BID.   # Etiology of anemia-likely CKD stage III/ [chronic diarrhea/malabsorption]-   #History of DVT/s/p IVC filter; ?  A. Fib-Eliquis-STABLE>   #Borderline low B12-230; s/p B12  injection.; proceed with injection q 4 months.    # DISPOSITION: # proceed  b12 today; HOLD  venofer today # Follow up in 4 months-MD; labs-- cbc;bmp; irons tudies/ferritin; B12 levels-possible Venofer / B12 injection- Dr.B  Cc; Tammi Klippel  All questions were answered. The patient knows to call the clinic with any problems, questions or concerns.    Cammie Sickle, MD 04/02/2022 1:49 PM

## 2022-04-02 NOTE — Progress Notes (Signed)
Pt in for follow up, reports not having any energy, dizziness and increased shortness of breath.  Pt reports having "increased fluid and swelling".  Pt states her appetite is also decreased.

## 2022-04-02 NOTE — Patient Instructions (Signed)
#  Recommend gentle iron 1 pill a day; should not upset your stomach or cause constipation.  Talk to the pharmacist if you can find it/it is over-the-counter.  

## 2022-04-03 ENCOUNTER — Ambulatory Visit (HOSPITAL_COMMUNITY)
Admission: RE | Admit: 2022-04-03 | Discharge: 2022-04-03 | Disposition: A | Discharge status: Home / Self Care | Payer: Medicare HMO | Source: Ambulatory Visit | Attending: Family Medicine | Admitting: Family Medicine

## 2022-04-03 ENCOUNTER — Encounter (HOSPITAL_COMMUNITY): Payer: Self-pay

## 2022-04-03 VITALS — BP 134/72 | HR 60 | Wt 196.6 lb

## 2022-04-03 DIAGNOSIS — Z7901 Long term (current) use of anticoagulants: Secondary | ICD-10-CM | POA: Insufficient documentation

## 2022-04-03 DIAGNOSIS — I5033 Acute on chronic diastolic (congestive) heart failure: Secondary | ICD-10-CM | POA: Diagnosis not present

## 2022-04-03 DIAGNOSIS — I4819 Other persistent atrial fibrillation: Secondary | ICD-10-CM | POA: Diagnosis not present

## 2022-04-03 DIAGNOSIS — J9611 Chronic respiratory failure with hypoxia: Secondary | ICD-10-CM | POA: Diagnosis not present

## 2022-04-03 DIAGNOSIS — J8410 Pulmonary fibrosis, unspecified: Secondary | ICD-10-CM | POA: Diagnosis not present

## 2022-04-03 DIAGNOSIS — E1122 Type 2 diabetes mellitus with diabetic chronic kidney disease: Secondary | ICD-10-CM | POA: Diagnosis not present

## 2022-04-03 DIAGNOSIS — I890 Lymphedema, not elsewhere classified: Secondary | ICD-10-CM | POA: Diagnosis not present

## 2022-04-03 DIAGNOSIS — J849 Interstitial pulmonary disease, unspecified: Secondary | ICD-10-CM | POA: Diagnosis not present

## 2022-04-03 DIAGNOSIS — J449 Chronic obstructive pulmonary disease, unspecified: Secondary | ICD-10-CM | POA: Diagnosis not present

## 2022-04-03 DIAGNOSIS — I340 Nonrheumatic mitral (valve) insufficiency: Secondary | ICD-10-CM | POA: Diagnosis not present

## 2022-04-03 DIAGNOSIS — I10 Essential (primary) hypertension: Secondary | ICD-10-CM | POA: Diagnosis not present

## 2022-04-03 DIAGNOSIS — G4733 Obstructive sleep apnea (adult) (pediatric): Secondary | ICD-10-CM | POA: Diagnosis not present

## 2022-04-03 DIAGNOSIS — N183 Chronic kidney disease, stage 3 unspecified: Secondary | ICD-10-CM | POA: Insufficient documentation

## 2022-04-03 DIAGNOSIS — I495 Sick sinus syndrome: Secondary | ICD-10-CM | POA: Insufficient documentation

## 2022-04-03 DIAGNOSIS — Z950 Presence of cardiac pacemaker: Secondary | ICD-10-CM | POA: Insufficient documentation

## 2022-04-03 DIAGNOSIS — Z9981 Dependence on supplemental oxygen: Secondary | ICD-10-CM | POA: Diagnosis not present

## 2022-04-03 DIAGNOSIS — Z8679 Personal history of other diseases of the circulatory system: Secondary | ICD-10-CM | POA: Diagnosis not present

## 2022-04-03 DIAGNOSIS — R627 Adult failure to thrive: Secondary | ICD-10-CM | POA: Diagnosis not present

## 2022-04-03 DIAGNOSIS — I5032 Chronic diastolic (congestive) heart failure: Secondary | ICD-10-CM | POA: Diagnosis not present

## 2022-04-03 DIAGNOSIS — R0600 Dyspnea, unspecified: Secondary | ICD-10-CM | POA: Diagnosis not present

## 2022-04-03 DIAGNOSIS — R5383 Other fatigue: Secondary | ICD-10-CM | POA: Diagnosis present

## 2022-04-03 DIAGNOSIS — I130 Hypertensive heart and chronic kidney disease with heart failure and stage 1 through stage 4 chronic kidney disease, or unspecified chronic kidney disease: Secondary | ICD-10-CM | POA: Diagnosis not present

## 2022-04-03 DIAGNOSIS — Z86718 Personal history of other venous thrombosis and embolism: Secondary | ICD-10-CM | POA: Diagnosis not present

## 2022-04-03 DIAGNOSIS — J811 Chronic pulmonary edema: Secondary | ICD-10-CM | POA: Diagnosis not present

## 2022-04-03 DIAGNOSIS — R42 Dizziness and giddiness: Secondary | ICD-10-CM | POA: Diagnosis not present

## 2022-04-03 DIAGNOSIS — R531 Weakness: Secondary | ICD-10-CM | POA: Insufficient documentation

## 2022-04-03 DIAGNOSIS — I272 Pulmonary hypertension, unspecified: Secondary | ICD-10-CM

## 2022-04-03 DIAGNOSIS — Z79899 Other long term (current) drug therapy: Secondary | ICD-10-CM | POA: Diagnosis not present

## 2022-04-03 DIAGNOSIS — R7881 Bacteremia: Secondary | ICD-10-CM | POA: Diagnosis not present

## 2022-04-03 DIAGNOSIS — I2690 Septic pulmonary embolism without acute cor pulmonale: Secondary | ICD-10-CM | POA: Diagnosis not present

## 2022-04-03 DIAGNOSIS — I2510 Atherosclerotic heart disease of native coronary artery without angina pectoris: Secondary | ICD-10-CM | POA: Diagnosis not present

## 2022-04-03 DIAGNOSIS — R5381 Other malaise: Secondary | ICD-10-CM

## 2022-04-03 DIAGNOSIS — I951 Orthostatic hypotension: Secondary | ICD-10-CM | POA: Diagnosis not present

## 2022-04-03 DIAGNOSIS — Z8673 Personal history of transient ischemic attack (TIA), and cerebral infarction without residual deficits: Secondary | ICD-10-CM | POA: Insufficient documentation

## 2022-04-03 NOTE — Patient Instructions (Addendum)
EKG done today.  No Labs done today.   No other medication changes were made. Please continue all current medications as prescribed.  A chest x-ray takes a picture of the organs and structures inside the chest, including the heart, lungs, and blood vessels. This test can show several things, including, whether the heart is enlarges; whether fluid is building up in the lungs; and whether pacemaker / defibrillator leads are still in place. We will call you with the results  Your physician recommends that you schedule a follow-up appointment in: 2 weeks for a lab only appointment(can be done in [place]) and in 3 months with Dr. [name] . Please contact our office in December to schedule a January 2024 appointment.   If you have any questions or concerns before your next appointment please send [place] a message through [place] or call our office at [phone].    TO LEAVE A MESSAGE FOR THE NURSE SELECT OPTION 2, PLEASE LEAVE A MESSAGE INCLUDING: YOUR NAME DATE OF BIRTH CALL BACK NUMBER REASON FOR CALL**this is important as we prioritize the call backs  YOU WILL RECEIVE A CALL BACK THE SAME DAY AS LONG AS YOU CALL BEFORE 4:00 PM   Do the following things EVERYDAY: Weigh yourself in the morning before breakfast. Write it down and keep it in a log. Take your medicines as prescribed Eat low salt foods--Limit salt (sodium) to 2000 mg per day.  Stay as active as you can everyday Limit all fluids for the day to less than 2 liters   At the [facility], you and your health needs are our priority. As part of our continuing mission to provide you with exceptional heart care, we have created designated Provider Care Teams. These Care Teams include your primary Cardiologist (physician) and Advanced Practice Providers (APPs- Physician Assistants and Nurse Practitioners) who all work together to provide you with the care you need, when you need it.   You may see any of the following  providers on your designated Care Team at your next follow up: Dr Glori Bickers Dr Haynes Kerns, NP Lyda Jester, [place] Audry Riles, PharmD   Please be sure to bring in all your medications bottles to every appointment.

## 2022-04-03 NOTE — Progress Notes (Signed)
Date:  04/03/2022   ID:  [name], DOB 1945-07-06, MRN [id number]   Provider location: [facility] Type of Visit: Established patient   PCP:  [name], MD  Cardiologist:  [name], MD HF Cardiologist: Dr. [name]  Chief Complaint: HF follow up   History of Present Illness: Debra Saunders is a 76 y.o. 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   Admitted 10/24-10/29/19 with acute on chronic diastolic HF. HF team consulted with concerns for low output. PICC was placed and milrinone to support RV function was started along with IV Lasix to facilitate diuresis. RHC done to evaluate PAH (see below). V/Q scan negative. High res CT showed pulmonary fibrosis. Pulmonary consult and recommended stopping amiodarone. She was transitioned to torsemide 60 mg BID. DC weight: 226 lbs.    She has started on selexipag for pulmonary hypertension in addition to Opsumit and Revatio.      Echo in 3/21 showed EF 60-65%, normal RV, PASP 50 mmHg, moderate-severe MR.   Had [place] 08/30/2020- low/normal filling pressures, mild pumonary htn, and preserved cardiac. output.   Admitted to [facility] 1/23 with endocarditis and PNA, requiring pressors. BCx positive for Streptococcus group G.  All her PAH meds were held. TEE showed a large vegetation on the pacer wire in the right atrium above the tricuspid valve (1 x 1.5 cm).  No vegetations noted on the valves.  EF was 55% and there was note of mild to moderate mitral regurgitation. RV ok. She was transferred to [facility] for pacemaker extraction. EP felt she was not a candidate due to multiple comorbidities. ID consulted  and planned for 6 weeks of IV abx. She developed Afib with RVR and started on amio. She underwent successful TEE-guided DCCV 07/14/21. Hospitalization complicated by anemia, hypotension, and AKI.  Midodrine started during admission. She was discharged to SNF, weight 198 lbs.  Admitted 3/23 with lightheadedness. Treated for sinus infection, HF meds held due to othostasis. Midodrine increased to 10 mg tid. HH PT arranged.  Repeat echo in 3/23 showed EF 60-65%, mild LVH, normal RV, IVC normal, no definite vegetation noted.   Follow up 8/23, continued with dyspnea, despite euvolemia. Amiodarone stopped, in case this was causing her symptoms, and arranged for RHC. RHC showed showed normal filling and PA pressures. Felt symptoms pulmonary-driven.  Today she returns for HF follow up with her daughter. Overall feeling fatigued.  She is homebound and is very weak. She uses a wheelchair when out of the house and uses a walker getting around the house. She is SOB with any activity. Feels dizzy with head movements. Denies palpitations, CP, edema, or PND/Orthopnea. Appetite ok. No fever or chills. Weight at home stable. Taking all medications. She wears CPAP.  ECG (personally reviewed): BiV pacing 62 bpm  Device interrogation (personally reviewed): no AT/AF  6 minute walk (1/20): 98 m 6 minute walk (7/20): 213 m 6 minute walk (3/22): 198 m 6 minute walk (10/22): 229 m   Labs (11/19): digoxin 0.8 Labs (1/20): K 4.2, creatinine 1.36 => 1.33, digoxin 0.8 Labs (2/20): digoxin 0.2, K 4.3, creatinine 1.32 Labs (5/20): K 4.5, creatinine 1.4, digoxin 0.8 Labs (1/22): K  4, creatinine 1.07 Labs (8/22): K 4.1, creatinine 1.03 Labs (1/23): K 3.7, creatinine 1.29 Labs (1/23): K 4.7, creatinine 1.07 Labs (3/23): K 4.3, creatinine 1.24 => 1.07, hgb 9.4 Labs (6/23): BNP 158 Labs (7/23): K 3.9, creatinine 1.1 Labs (10/23): K 3.4, creatinine 1.25, hgb 10   PMH: 1. Lymphedema: Left lower leg (chronic).  2. COPD 3.  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  - PFTs (2017) with FEV1 64%, FEV1/FVC ratio 106%, TLC 49%, DLCO 39% (restrictive).  - CT chest high resolution (10/19): Pulmonary parenchymal pattern of fibrosis.  - CT chest (4/22): Chronic ILD 4. Atrial fibrillation: Paroxysmal.  She was previously off amiodarone due to lung disease.  - 2/19 DCCV.  - 1/23 DCCV 5. H/o DVT s/p IVC filter in 2004.  6. CAD: 4/19 coronary angiography with minimal coronary disease.  7. Bradycardia: [employer] PPM.  8. H/o traumatic SAH 2014.  9. CKD: Stage 3.  10. H/o CVA 11. OSA: CPAP 12. HTN 13. Type II diabetes 14. Pulmonary hypertension:  - PFTs 2017 with severe restriction.  - Echo (10/19): EF 60-65%, PASP 52 mmHg - RHC (4/19): Severe PAH with PVR 7.2 WU.  - RHC (10/19): mean RA 6, PA 56/17 mean 33, PCWP mean 12, CI 3.23, PVR 3.4 WU.  - RHC (3/22): mean 3, RV 30/1, PA 33/11, mean 20, PCWP mean 6, Oxygen saturations: PA 69% AO 100%, Cardiac Output (Fick) 6.18, Cardiac Index (Fick) 3.36, and PVR 2.3 WU - V/Q scan (10/19): No chronic PE.  - Anti-SCL 70 and ANA negative.  ACE normal.  RF slightly elevated at 15.  - Echo (3/21): EF 60-65%, normal RV, PASP 50 mmHg, moderate-severe MR. - TEE (1/23): EF 60-65%, mild RV dilation with normal RV systolic function, MR mild-moderate, moderate TR with vegetation on TV and pacemaker lead - Echo (1/23): EF 60-65%, normal RV, RVSP 51.5 mmHg, mild-moderate MR, moderate TR - Echo (3/23): EF 60-65%, mild LVH, normal RV, IVC normal, no definite vegetation noted.  - RHC (8/23): RA 3, PA 29/10 (mean 16), PCWP 12, CO/CI (Fick) 6.18/3.37 15. 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  16. Mitral regurgitation: moderate to severe on 3/22 echo.  17. Zio patch (3/22): 9.3% PACs, no AF, 2.9% PVCs 18. 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  Deemed too high risk to remove device so have tried to treat through.   FH:  mother with CHF  Current Outpatient Medications  Medication Sig Dispense Refill   acetaminophen (TYLENOL) 325 MG tablet Take 325-650 mg by mouth every 6 (six) hours as needed for mild pain.     albuterol (ACCUNEB) 0.63 MG/3ML nebulizer solution Take 1 ampule by nebulization every 6 (six) hours as needed for wheezing.     apixaban (ELIQUIS) 5 MG TABS tablet TAKE 1 TABLET BY MOUTH TWICE DAILY. APPOINTMENT NEEDED FOR FURTHER REFILLS. 180 tablet 0   benzonatate (TESSALON) 200 MG capsule Take 1 capsule (200 mg total) by mouth 3 (three) times daily as needed for cough. 20 capsule 0   cefadroxil (DURICEF) 500 MG capsule Take 1 capsule (500 mg total) by mouth 2 (two) times daily. 180 capsule 3   Difluprednate 0.05 % EMUL Place 1 drop into both eyes 2 (two) times daily.     gabapentin (NEURONTIN) 300 MG capsule Take 2 capsules (600 mg total) by mouth 3 (three) times daily. 540 capsule 1   macitentan (OPSUMIT) 10 MG tablet Take 1 tablet (10 mg total)  by mouth daily. 90 tablet 3   meclizine (ANTIVERT) 25 MG tablet Take 1 tablet (25 mg total) by mouth 3 (three) times daily as needed for dizziness. 30 tablet 1   Menthol, Topical Analgesic, (BIOFREEZE EX) Apply 1 application topically 4 (four) times daily as needed (for pain).     metolazone (ZAROXOLYN) 2.5 MG tablet Take 2.5 mg by mouth every Monday, Wednesday, and Friday.     midodrine (PROAMATINE) 5 MG tablet Take 5 mg by mouth 3 (three) times daily.     potassium chloride SA (KLOR-CON M) 20 MEQ tablet Take 40 mEq by mouth 2 (two) times daily.     rosuvastatin (CRESTOR) 10 MG tablet Take 1 tablet (10 mg total) by mouth daily. 90 tablet 3   Selexipag (UPTRAVI) 1600 MCG TABS Take 1 tablet (1,600 mcg total) by mouth 2 (two) times daily. 60 tablet 11   sildenafil (REVATIO) 20 MG tablet Take 1 tablet (20 mg total) by mouth 3 (three) times daily. 10 tablet 0   spironolactone (ALDACTONE) 25 MG tablet Take 0.5 tablet (12.5 mg) by mouth once daily     torsemide  (DEMADEX) 20 MG tablet Take 3 tablets (60 mg total) by mouth 2 (two) times daily. Patient takes 3 tablets by mouth twice a day. (Patient taking differently: Take 60 mg by mouth 2 (two) times daily.) 180 tablet 6   No current facility-administered medications for this encounter.   Allergies:   Aspirin and Other   Social History:  The patient  reports that she quit smoking about 31 years ago. Her smoking use included cigarettes. She has a 0.60 pack-year smoking history. She has never used smokeless tobacco. She reports that she does not drink alcohol and does not use drugs.   Family History:  The patient's family history includes Bone cancer in her sister; Breast cancer in her maternal aunt and sister; COPD in her mother; Heart disease in her mother; Hypertension in her father, mother, sister, and sister; Stroke in her son; Thyroid disease in her mother, sister, and sister.   ROS:  Please see the history of present illness.   All other systems are personally reviewed and negative.   Recent Labs: 08/27/2021: Magnesium 2.5 12/20/2021: B Natriuretic Peptide 157.5 01/28/2022: ALT 12; TSH 0.397 04/02/2022: BUN 34; Creatinine, Ser 1.25; Hemoglobin 10.0; Platelets 163; Potassium 3.4; Sodium 137  Personally reviewed   Wt Readings from Last 3 Encounters:  04/03/22 89.2 kg (196 lb 9.6 oz)  04/02/22 88.9 kg (196 lb)  03/20/22 88 kg (194 lb)    BP 134/72   Pulse 60   Wt 89.2 kg (196 lb 9.6 oz)   SpO2 99% Comment: Patient is on 4-liters of room oxygen.  BMI 38.40 kg/m   Physical Exam General:  NAD. No resp difficulty, arrived in [facility], fatigued-appearing. HEENT: Normal Neck: Supple. No JVD. Carotids 2+ bilat; no bruits. No lymphadenopathy or thryomegaly appreciated. Cor: PMI nondisplaced. Regular rate & rhythm. No rubs, gallops, 2/6 HSM. Lungs: Crackles RUL Abdomen: Obese, soft, nontender, nondistended. No hepatosplenomegaly. No bruits or masses. Good bowel sounds. Extremities: No cyanosis, clubbing,  rash; chronic LLE lymphedema. Neuro: Alert & oriented x 3, cranial nerves grossly intact. Moves all 4 extremities w/o difficulty. Affect pleasant.  Assessment & Plan 1. Atrial fibrillation with tachy-brady syndrome:  Had been off amiodarone since 2019 due to interstitial lung disease/pulmonary fibrosis. Amiodarone restarted during 1/23 admission. s/p TEE-guided DCCV 1/23. She is in NSR today.  - She is now off amiodarone.  SR on ECG today. - Continue Eliquis 5 mg bid.  No bleeding issues. 2. 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 Echo in 3/23 showed EF 60-65%, mild LVH, normal RV, IVC normal, no definite vegetation noted. She is now off midodrine.  Repeat RHC (8/23) showed normal filling and PA pressures. NYHA class IIIb symptoms, though she is not volume overloaded on exam.  Likely that interstitial lung disease may be playing a large role in her current symptoms.   - Continue spironolactone 12.5 mg daily. - Continue torsemide 60 mg bid + metolazone 2.5 M/W/F.  Labs reviewed from Heme visit yesterday, K 3.4 and SCr 1.25. Repeat BMET in 2 weeks. Instructed to maintain compliance with KCL. - Consider SGLT2 inhibitor in future but will hold off today with generalized fatigue/weakness.  3. Pulmonary hypertension: Based on 4/19 cath, she had severe PAH with PVR 7.2 WU. Chronic hypoxemic respiratory failure, on home oxygen.  She has a history of remote DVT (2004) but V/Q scan negative for evidence of chronic PE.  High resolution CT chest in 10/19 showed pulmonary fibrosis, and PFTs in 2017 showed severe restriction.  Serological workup for rheumatological diseases was unremarkable.  There has been concern for possible burnt-out sarcoidosis as cause of her interstitial fibrosis and hilar adenopathy (has FH of sarcoidosis) versus chronic  hypersensitivity pneumonitis. Amiodarone was stopped in past given lung parenchymal disease then restarted. 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 RHC (8/23) showed normal filling and PA pressures. - Continue macitentan 10 daily.  - Continue Selexipag 1600 bid.   - Continue sildenafil 20 mg tid.  - She cannot do a 6 minute walk today.  - Home oxygen, CPAP.  - As above, now off amiodarone in case it is playing a role in dyspnea.  4. Endocarditis: in 1/23.  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  Group G strep bacteremia.  Septic emboli to lungs. Seen by EP, thought to be too high risk for device extraction. Unfortunately she will be at high risk for complications going forward.  - She is now on lifetime suppression with cefadroxil.  5. Chronic hypoxemic respiratory failure:  At baseline, on 4L home oxygen with CPAP at night for interstitial lung disease and OSA.   - Crackles in RUL, obtain CXR. - Continue home O2 4 L by [place] (baseline).  - Followup with pulmonary at [facility].  6. HTN: Stable today. No medication changes.  7. H/o DVT: Anticoagulated.  8. Left leg lymphedema: Chronic.  9. OSA: CPAP.  10. Orthostatic hypotension: She is now off midodrine.  - She is followed by Neurology. Gabapentin was recently decreased and Cymbalta stopped. 11. Fatigue/FTT: Suspect multifactorial (anemia, PAH, chronic respiratory failure, and physical deconditioning). May be beneficial to discuss palliative care soon.  Follow up in 3 months with Dr. [name]. Advised she take a COVID test today (daughter was COVID + last week).  Signed, Rafael Bihari, FNP  04/03/2022  [facility] [address] [facility] [place] [address] [phone] (office) [phone] (fax)

## 2022-04-04 ENCOUNTER — Encounter (HOSPITAL_COMMUNITY): Payer: Self-pay

## 2022-04-07 ENCOUNTER — Telehealth (HOSPITAL_COMMUNITY): Payer: Self-pay

## 2022-04-07 MED ORDER — DOXYCYCLINE MONOHYDRATE 100 MG PO TABS: 100.0000 mg | ORAL_TABLET | Freq: Two times a day (BID) | ORAL | 0 refills | Status: DC

## 2022-04-07 NOTE — Telephone Encounter (Addendum)
Daughter aware and agreeable. Tested negative for covid twice last week    Rx sent   ----- Message from Rafael Bihari, FNP sent at 04/07/2022  9:03 AM EDT ----- Possible RUL infection/PNA. She needs to take a COVID test if she has not already done so (her daughter recently was COVID+).   She is on chronic suppression for endocarditis with cefadroxil. Recent QTc on ECG was long, 521 msec.   Will avoid respiratory fluoroquinolone and start doxycycline 100 mg bid x 5 days. Discussed with PharmD as well.  Please call. Will also send to her PCP as Juluis Rainier

## 2022-04-07 NOTE — Addendum Note (Signed)
Encounter addended by: Rafael Bihari, FNP on: 04/07/2022 8:23 AM  Actions taken: Clinical Note Signed

## 2022-04-11 ENCOUNTER — Telehealth: Payer: Self-pay | Admitting: Family Medicine

## 2022-04-11 NOTE — Telephone Encounter (Signed)
Patient called and stated she tested positive at home for COVID last week. She was not seen by a provider, She was calling for medication. Patient stated she was diagnose with pneumonia last week. She also said that her side his hurting worse then last week. Patient was advised that there were no appointment available and she needs to be seen. Patient refused to be triaged, so patient was advised to go to urgent care to be evaluated. Patient stated oh and hung up.

## 2022-04-11 NOTE — Telephone Encounter (Signed)
Noted. Please follow-up with her next week to make sure she got evaluated.

## 2022-04-14 ENCOUNTER — Other Ambulatory Visit (HOSPITAL_COMMUNITY): Payer: Self-pay | Admitting: Cardiology

## 2022-04-14 DIAGNOSIS — I4819 Other persistent atrial fibrillation: Secondary | ICD-10-CM

## 2022-04-14 MED ORDER — APIXABAN 5 MG PO TABS: 5.0000 mg | ORAL_TABLET | Freq: Two times a day (BID) | ORAL | 0 refills | Status: DC

## 2022-04-14 NOTE — Telephone Encounter (Signed)
Noted  

## 2022-04-14 NOTE — Telephone Encounter (Signed)
I called and spoke with the patients daughter and she stated that the patient had covid and she did not want to go to urgent care and she is much better today with no complaints she thinks the worse if over.  She is only having fatigue, never had a cough or fever.  Kenetha Cozza,cma

## 2022-04-17 ENCOUNTER — Other Ambulatory Visit (HOSPITAL_COMMUNITY): Payer: Self-pay

## 2022-04-21 NOTE — Progress Notes (Signed)
Had set up a home visit for today but Debra Saunders states she is feeling better but still weak.  She still sleeping a lot and laying in bed.  She states her house is a mess and she does have all her medications.  I had cardiology to send in new script for her eliquis the beginning of the week, she states her daughter is picking it up today.  She states she is drinking and eating a little.  She states her family is getting what ever she needs and checking on her a lot.  She states that she wants to wait til next week for a visit.  Advised her if she needs anything to let me know.  She is aware of how to take her medications.   Debra Saunders [phone]

## 2022-04-22 ENCOUNTER — Other Ambulatory Visit
Admission: RE | Admit: 2022-04-22 | Discharge: 2022-04-22 | Disposition: A | Discharge status: Home / Self Care | Payer: Medicare HMO | Source: Ambulatory Visit | Attending: Family Medicine | Admitting: Family Medicine

## 2022-04-22 DIAGNOSIS — I5032 Chronic diastolic (congestive) heart failure: Secondary | ICD-10-CM | POA: Diagnosis not present

## 2022-04-22 DIAGNOSIS — R42 Dizziness and giddiness: Secondary | ICD-10-CM | POA: Diagnosis not present

## 2022-04-22 DIAGNOSIS — R200 Anesthesia of skin: Secondary | ICD-10-CM | POA: Diagnosis not present

## 2022-04-22 DIAGNOSIS — H539 Unspecified visual disturbance: Secondary | ICD-10-CM | POA: Diagnosis not present

## 2022-04-22 DIAGNOSIS — R251 Tremor, unspecified: Secondary | ICD-10-CM | POA: Diagnosis not present

## 2022-04-22 LAB — BASIC METABOLIC PANEL
Anion gap: 6 (ref 5–15)
BUN: 40 mg/dL — ABNORMAL HIGH (ref 8–23)
CO2: 27 mmol/L (ref 22–32)
Calcium: 9.6 mg/dL (ref 8.9–10.3)
Chloride: 107 mmol/L (ref 98–111)
Creatinine, Ser: 1.69 mg/dL — ABNORMAL HIGH (ref 0.44–1.00)
GFR, Estimated: 31 mL/min — ABNORMAL LOW (ref >60)
Glucose, Bld: 126 mg/dL — ABNORMAL HIGH (ref 70–99)
Potassium: 5.4 mmol/L — ABNORMAL HIGH (ref 3.5–5.1)
Sodium: 140 mmol/L (ref 135–145)

## 2022-04-22 LAB — BRAIN NATRIURETIC PEPTIDE: B Natriuretic Peptide: 57.8 pg/mL (ref 0.0–100.0)

## 2022-04-22 NOTE — Addendum Note (Signed)
Addended by: Antonieta Iba C on: 04/22/2022 02:13 PM   Modules accepted: Orders

## 2022-04-25 ENCOUNTER — Telehealth (HOSPITAL_COMMUNITY): Payer: Self-pay

## 2022-04-25 DIAGNOSIS — I5032 Chronic diastolic (congestive) heart failure: Secondary | ICD-10-CM

## 2022-04-25 MED ORDER — POTASSIUM CHLORIDE CRYS ER 20 MEQ PO TBCR: 40.0000 meq | EXTENDED_RELEASE_TABLET | Freq: Every day | ORAL | 2 refills | Status: DC

## 2022-04-25 NOTE — Telephone Encounter (Signed)
Patients daughter advised and verbalized understanding,med list updated to reflect changes,lab orders entered, patient will have labs done in [place].   Meds ordered this encounter  Medications   potassium chloride SA (KLOR-CON M) 20 MEQ tablet    Sig: Take 2 tablets (40 mEq total) by mouth daily.    Dispense:  60 tablet    Refill:  2    Please cancel all previous orders for current medication. Change in dosage or pill size.   Orders Placed This Encounter  Procedures   Basic metabolic panel    Standing Status:   Future    Standing Expiration Date:   04/26/2023    Order Specific Question:   Release to patient    Answer:   Immediate    Order Specific Question:   Release to patient    Answer:   Immediate [1]

## 2022-04-25 NOTE — Telephone Encounter (Signed)
-----   Message from Rafael Bihari, [place] sent at 04/22/2022  4:42 PM EDT ----- K elevated.  Decrease KCL to 40 daily. Repeat BMET Friday or Monday.

## 2022-04-30 ENCOUNTER — Other Ambulatory Visit (HOSPITAL_COMMUNITY): Payer: Self-pay | Admitting: *Deleted

## 2022-04-30 ENCOUNTER — Other Ambulatory Visit (HOSPITAL_COMMUNITY): Payer: Self-pay | Admitting: Cardiology

## 2022-04-30 ENCOUNTER — Other Ambulatory Visit (HOSPITAL_COMMUNITY): Payer: Self-pay

## 2022-04-30 MED ORDER — SILDENAFIL CITRATE 20 MG PO TABS: 20.0000 mg | ORAL_TABLET | Freq: Three times a day (TID) | ORAL | 3 refills | Status: DC

## 2022-05-01 NOTE — Progress Notes (Signed)
Had a home visit with Debra Saunders.  She is feeling better but just has not recovered fully, still tired and get exhausted quick.  She is trying to stay up more and do a few things.  She never heard about her sleep study she was suppose to have.  She has all her medications but running out of sildinafil today, contacted HF clinic in [place] for a new script for her pharmacy.   Filled 2 weeks of medications according to her last appts.  She states she has lost weight since laying in the bed not eating as much.   Her edema has went down in her legs.  She has some dizziness, she started taking her meclizine and hope it is vertigo, she says it feels like it.  Dizzy laying in the bed when moves to try to get up.  Lungs are clear.  She has no other complaints today such as chest pain, headaches or increased shortness of breath.  She is aware of her up coming appts.  Will continue to visit for heart failure, diet and medication compliant.   [place] [phone]

## 2022-05-08 DIAGNOSIS — N1832 Chronic kidney disease, stage 3b: Secondary | ICD-10-CM | POA: Diagnosis not present

## 2022-05-08 DIAGNOSIS — E1122 Type 2 diabetes mellitus with diabetic chronic kidney disease: Secondary | ICD-10-CM | POA: Diagnosis not present

## 2022-05-08 DIAGNOSIS — E875 Hyperkalemia: Secondary | ICD-10-CM | POA: Diagnosis not present

## 2022-05-22 ENCOUNTER — Ambulatory Visit: Payer: Self-pay

## 2022-05-22 NOTE — Patient Instructions (Signed)
Visit Information  Thank you for taking time to visit with me today. Please don't hesitate to contact me if I can be of assistance to you.   Following are the goals we discussed today:   Goals Addressed             This Visit's Progress    Patient Stated:  Managing dizziness       Care Coordination Interventions: Evaluation of current treatment plan related to dizziness and patient's adherence to plan as established by provider Reviewed medications with patient and discussed importance of compliance. Reviewed scheduled/upcoming provider appointments  Discussed plans with patient for ongoing care management follow up and provided patient with direct contact information for care management team Discussed fall precautions Advised patient to arise from laying down or seated position slowly. Advised to sit down immediately if/ when experiencing dizziness when standing Advised to notify provider for worsening symptoms.           Our next appointment is by telephone on 06/12/22 at 3 pm  Please call the care guide team at [phone] if you need to cancel or reschedule your appointment.   If you are experiencing a Mental Health or [place] or need someone to talk to, please call the Suicide and Crisis Lifeline: 988 call 1-800-273-TALK (toll free, 24 hour hotline)  Patient verbalizes understanding of instructions and care plan provided today and agrees to view in [place]. Active MyChart status and patient understanding of how to access instructions and care plan via MyChart confirmed with patient.     Quinn Plowman RN,BSN,CCM [facility] Care Coordination [phone] direct line  Dizziness Dizziness is a common problem. It makes you feel unsteady or light-headed. You may feel like you are about to pass out (faint). Dizziness can lead to getting hurt if you stumble or fall. Dizziness can be caused by many things, including: Medicines. Not having enough water in your body  (dehydration). Illness. Follow these instructions at home: Eating and drinking  Drink enough fluid to keep your pee (urine) pale yellow. This helps to keep you from getting dehydrated. Try to drink more clear fluids, such as water. Do not drink alcohol. Limit how much caffeine you drink or eat, if your doctor tells you to do that. Limit how much salt (sodium) you drink or eat, if your doctor tells you to do that. Activity  Avoid making quick movements. Stand up slowly from sitting in a chair, and steady yourself until you feel okay. In the morning, first sit up on the side of the bed. When you feel okay, stand up slowly while you hold onto something. Do this until you know that your balance is okay. If you need to stand in one place for a long time, move your legs often. Tighten and relax the muscles in your legs while you are standing. Do not drive or use machinery if you feel dizzy. Avoid bending down if you feel dizzy. Place items in your home so you can reach them easily without leaning over. Lifestyle Do not smoke or use any products that contain nicotine or tobacco. If you need help quitting, ask your doctor. Try to lower your stress level. You can do this by using methods such as yoga or meditation. Talk with your doctor if you need help. General instructions Watch your dizziness for any changes. Take over-the-counter and prescription medicines only as told by your doctor. Talk with your doctor if you think that you are dizzy because of a  medicine that you are taking. Tell a friend or a family member that you are feeling dizzy. If he or she notices any changes in your behavior, have this person call your doctor. Keep all follow-up visits. Contact a doctor if: Your dizziness does not go away. Your dizziness or light-headedness gets worse. You feel like you may vomit (are nauseous). You have trouble hearing. You have new symptoms. You are unsteady on your feet. You feel like the  room is spinning. You have neck pain or a stiff neck. You have a fever. Get help right away if: You vomit or have watery poop (diarrhea), and you cannot eat or drink anything. You have trouble: Talking. Walking. Swallowing. Using your arms, hands, or legs. You feel generally weak. You are not thinking clearly, or you have trouble forming sentences. A friend or family member may notice this. You have: Chest pain. Pain in your belly (abdomen). Shortness of breath. Sweating. Your vision changes. You are bleeding. You have a very bad headache. These symptoms may be an emergency. Get help right away. Call your local emergency services (911 in the [place] Do not wait to see if the symptoms will go away. Do not drive yourself to the hospital. Summary Dizziness makes you feel unsteady or light-headed. You may feel like you are about to pass out (faint). Drink enough fluid to keep your pee (urine) pale yellow. Do not drink alcohol. Avoid making quick movements if you feel dizzy. Watch your dizziness for any changes. This information is not intended to replace advice given to you by your health care provider. Make sure you discuss any questions you have with your health care provider. Document Revised: 05/14/2020 Document Reviewed: 05/14/2020 Elsevier Patient Education  [place].

## 2022-05-22 NOTE — Patient Outreach (Signed)
  Care Coordination   Follow Up Visit Note   05/22/2022 Name: Debra Saunders MRN: [id number] DOB: [birth year]  Debra Saunders is a 76 y.o. year old female who sees Debra Saunders, Debra Adam, MD for primary care. I spoke with  Debra Saunders by phone today.  What matters to the patients health and wellness today?  Patient states her major concern is the dizziness she has been experiencing for 1 year.  She reports having tests but states the doctors are not able to tell what is causing it.  Patient states she continues to follow up with her neurologist with last visit being 04/22/22.  She reports her gabapentin medication was decreased and she was restarted on Midodrine.      Goals Addressed             This Visit's Progress    Patient Stated:  Managing dizziness       Care Coordination Interventions: Evaluation of current treatment plan related to dizziness and patient's adherence to plan as established by provider Reviewed medications with patient and discussed importance of compliance. Reviewed scheduled/upcoming provider appointments  Discussed plans with patient for ongoing care management follow up and provided patient with direct contact information for care management team Discussed fall precautions Advised patient to arise from laying down or seated position slowly. Advised to sit down immediately if/ when experiencing dizziness when standing Advised to notify provider for worsening symptoms.           SDOH assessments and interventions completed:  Yes  SDOH Interventions Today    Flowsheet Row Most Recent Value  SDOH Interventions   Food Insecurity Interventions Intervention Not Indicated  Housing Interventions Intervention Not Indicated  Transportation Interventions Other (Comment)  [refer to [employer] guide for transportation assistance.]        Care Coordination Interventions:  Yes, provided   Follow up plan: Follow up call scheduled for 06/12/22     Encounter Outcome:  Pt. Visit Completed   Debra Plowman RN,BSN,CCM [place] [phone] direct line

## 2022-05-23 ENCOUNTER — Other Ambulatory Visit: Payer: Self-pay | Admitting: Family Medicine

## 2022-05-23 DIAGNOSIS — M940 Chondrocostal junction syndrome [Tietze]: Secondary | ICD-10-CM

## 2022-05-23 NOTE — Telephone Encounter (Signed)
This is not on her medication list. Can you see if she has been taking this daily? If she has not, why did she request it?

## 2022-05-27 ENCOUNTER — Ambulatory Visit (INDEPENDENT_AMBULATORY_CARE_PROVIDER_SITE_OTHER): Payer: Medicare HMO

## 2022-05-27 DIAGNOSIS — I495 Sick sinus syndrome: Secondary | ICD-10-CM

## 2022-05-27 LAB — CUP PACEART REMOTE DEVICE CHECK
Battery Remaining Longevity: 48 mo
Battery Remaining Percentage: 54 %
Brady Statistic RA Percent Paced: 89 %
Brady Statistic RV Percent Paced: 98 %
Date Time Interrogation Session: 20231205032100
Implantable Lead Connection Status: 753985
Implantable Lead Connection Status: 753985
Implantable Lead Implant Date: 20150720
Implantable Lead Implant Date: 20150720
Implantable Lead Location: 753859
Implantable Lead Location: 753860
Implantable Lead Model: 5076
Implantable Lead Model: 5076
Implantable Pulse Generator Implant Date: 20150720
Lead Channel Impedance Value: 353 Ohm
Lead Channel Impedance Value: 484 Ohm
Lead Channel Pacing Threshold Amplitude: 0.7 V
Lead Channel Pacing Threshold Pulse Width: 0.4 ms
Lead Channel Setting Pacing Amplitude: 2 V
Lead Channel Setting Pacing Amplitude: 2.4 V
Lead Channel Setting Pacing Pulse Width: 0.4 ms
Lead Channel Setting Sensing Sensitivity: 2.5 mV
Pulse Gen Serial Number: 390330
Zone Setting Status: 755011

## 2022-05-27 NOTE — Telephone Encounter (Signed)
Spoke with pt.  She reported that Dr. Melrose Nakayama took her off the Duloxetine about a month ago.  She reported that he stopped several medications due to dizziness. I have refused the refill request.

## 2022-05-28 ENCOUNTER — Telehealth (HOSPITAL_COMMUNITY): Payer: Self-pay | Admitting: Pharmacy Technician

## 2022-05-28 NOTE — Telephone Encounter (Signed)
Patient Advocate Encounter   Received notification from [facility] that prior authorization for Opsumit is required.   PA submitted on CoverMyMeds Key [id number] Status is pending   Will continue to follow.

## 2022-05-28 NOTE — Telephone Encounter (Signed)
Advanced Heart Failure Patient Advocate Encounter  Prior Authorization for Opsumit has been approved.    PA# [id number] Effective dates: 06/23/21 through 06/23/23  Charlann Boxer, CPhT

## 2022-05-29 ENCOUNTER — Other Ambulatory Visit (HOSPITAL_COMMUNITY): Payer: Self-pay

## 2022-05-29 NOTE — Progress Notes (Signed)
Today had a home visit with Debra Saunders.  She states doing better.  She still has some dizziness.  She has been doing more things around the home now.  She went to her sons over Thanksgiving and did good.  She will be staying at her daughters for Christmas.  She is aware of her up coming appts.  Fixed her med box for 1 week and called in refills to her pharmacy.  She has some edema in her legs, more in left.  Denies chest pain, headaches, or increased shortness of breath.  She is aware to stop when she gets tired.  Still having problems sleeping through the night, gets up later in the day.  Tries to watch fluids and high sodium foods.  Has everything for daily living and good family support when she ask for help or they see she needs it.  Will continue to visit for heart failure, diet and medication management.   [place] [phone]

## 2022-05-30 ENCOUNTER — Telehealth (HOSPITAL_COMMUNITY): Payer: Self-pay | Admitting: Pharmacy Technician

## 2022-05-30 NOTE — Telephone Encounter (Signed)
Advanced Heart Failure Patient Advocate Encounter  Prior Authorization for Uptravi 1600 mcg has been approved.    PA# [id number] Effective dates: 05/30/22 through 06/23/23  Charlann Boxer, CPhT

## 2022-05-30 NOTE — Telephone Encounter (Addendum)
Patient Advocate Encounter   Received notification from [facility] that prior authorization for Sildenafil is required.

## 2022-05-30 NOTE — Telephone Encounter (Signed)
Patient Advocate Encounter   Received notification from [facility] that prior authorization for Uptravi 1600 mcg is required.   PA submitted on CoverMyMeds Key [id number] Status is pending   Will continue to follow.

## 2022-06-03 ENCOUNTER — Encounter: Payer: Self-pay | Admitting: Nephrology

## 2022-06-05 ENCOUNTER — Telehealth: Payer: Self-pay | Admitting: Internal Medicine

## 2022-06-05 DIAGNOSIS — R200 Anesthesia of skin: Secondary | ICD-10-CM | POA: Diagnosis not present

## 2022-06-05 DIAGNOSIS — I482 Chronic atrial fibrillation, unspecified: Secondary | ICD-10-CM | POA: Diagnosis not present

## 2022-06-05 DIAGNOSIS — R42 Dizziness and giddiness: Secondary | ICD-10-CM | POA: Diagnosis not present

## 2022-06-05 DIAGNOSIS — R251 Tremor, unspecified: Secondary | ICD-10-CM | POA: Diagnosis not present

## 2022-06-05 NOTE — Telephone Encounter (Signed)
Office note from nephrology-reviewed, Dr.Lateef.   BC-please reschedule all feb patient's appointment to the week of Christmas if possible.

## 2022-06-10 ENCOUNTER — Other Ambulatory Visit (HOSPITAL_COMMUNITY): Payer: Self-pay

## 2022-06-11 NOTE — Telephone Encounter (Signed)
Advanced Heart Failure Patient Advocate Encounter  Placed a call to [facility] to inquire questions for Sildenafil submission. Had to send physical PA fax to insurance for approval.  Will follow up.

## 2022-06-12 ENCOUNTER — Ambulatory Visit: Payer: Self-pay

## 2022-06-12 NOTE — Telephone Encounter (Signed)
Advanced Heart Failure Patient Advocate Encounter  Prior Authorization for Sildenafil has been approved.    Effective dates: 06/11/22 through 06/23/23  Charlann Boxer, CPhT

## 2022-06-12 NOTE — Patient Outreach (Signed)
  Care Coordination   Follow Up Visit Note   06/12/2022 Name: Debra Saunders MRN: [id number] DOB: [birth year]  Debra Saunders is a 76 y.o. year old female who sees [name], [name], MD for primary care. I spoke with  Debra Saunders by phone today.  What matters to the patients health and wellness today?  Managing dizziness    Goals Addressed             This Visit's Progress    Patient Stated:  Managing dizziness       Care Coordination Interventions: Evaluation of current treatment plan related to dizziness and patient's adherence to plan as established by provider:  Patient states her dizziness is about the same. She reports seeing the neurologist on 06/05/22 who advised her to restart taking her Midodrine. She states neurologist referred her back to her cardiologist for further evaluation. Patient denies any falls.  Reviewed medications with patient and discussed importance of compliance. Reviewed scheduled/upcoming provider appointments:  Patient states her next follow up visit with be in February 2024.  Discussed plans with patient for ongoing care management follow up and provided patient with direct contact information for care management team.  Patient agreed to telephone follow up visit with RNCM on 08/20/22.  Advised patient to arise from laying down or seated position slowly. Advised to sit down immediately if/ when experiencing dizziness when standing Advised to notify provider for worsening symptoms.           SDOH assessments and interventions completed:  No     Care Coordination Interventions:  Yes, provided   Follow up plan: Follow up call scheduled for 08/20/22    Encounter Outcome:  Pt. Visit Completed   Quinn Plowman RN,BSN,CCM [place] [phone] direct line

## 2022-06-18 ENCOUNTER — Inpatient Hospital Stay: Payer: Medicare HMO

## 2022-06-18 ENCOUNTER — Inpatient Hospital Stay (HOSPITAL_BASED_OUTPATIENT_CLINIC_OR_DEPARTMENT_OTHER): Payer: Medicare HMO | Admitting: Internal Medicine

## 2022-06-18 ENCOUNTER — Other Ambulatory Visit (HOSPITAL_COMMUNITY): Payer: Self-pay

## 2022-06-18 ENCOUNTER — Inpatient Hospital Stay: Payer: Medicare HMO | Attending: Internal Medicine

## 2022-06-18 ENCOUNTER — Encounter: Payer: Self-pay | Admitting: Internal Medicine

## 2022-06-18 VITALS — BP 122/51 | HR 63 | Temp 98.6°F | Resp 18 | Ht 60.0 in | Wt 200.0 lb

## 2022-06-18 VITALS — BP 138/57 | HR 60 | Resp 18

## 2022-06-18 DIAGNOSIS — R002 Palpitations: Secondary | ICD-10-CM | POA: Insufficient documentation

## 2022-06-18 DIAGNOSIS — I4891 Unspecified atrial fibrillation: Secondary | ICD-10-CM | POA: Diagnosis not present

## 2022-06-18 DIAGNOSIS — E611 Iron deficiency: Secondary | ICD-10-CM

## 2022-06-18 DIAGNOSIS — Z803 Family history of malignant neoplasm of breast: Secondary | ICD-10-CM | POA: Insufficient documentation

## 2022-06-18 DIAGNOSIS — R0602 Shortness of breath: Secondary | ICD-10-CM | POA: Insufficient documentation

## 2022-06-18 DIAGNOSIS — E876 Hypokalemia: Secondary | ICD-10-CM | POA: Diagnosis not present

## 2022-06-18 DIAGNOSIS — Z7901 Long term (current) use of anticoagulants: Secondary | ICD-10-CM | POA: Insufficient documentation

## 2022-06-18 DIAGNOSIS — N898 Other specified noninflammatory disorders of vagina: Secondary | ICD-10-CM

## 2022-06-18 DIAGNOSIS — R5383 Other fatigue: Secondary | ICD-10-CM | POA: Insufficient documentation

## 2022-06-18 DIAGNOSIS — E538 Deficiency of other specified B group vitamins: Secondary | ICD-10-CM

## 2022-06-18 DIAGNOSIS — Z86718 Personal history of other venous thrombosis and embolism: Secondary | ICD-10-CM | POA: Insufficient documentation

## 2022-06-18 DIAGNOSIS — N183 Chronic kidney disease, stage 3 unspecified: Secondary | ICD-10-CM | POA: Insufficient documentation

## 2022-06-18 DIAGNOSIS — Z87891 Personal history of nicotine dependence: Secondary | ICD-10-CM | POA: Diagnosis not present

## 2022-06-18 DIAGNOSIS — D631 Anemia in chronic kidney disease: Secondary | ICD-10-CM | POA: Diagnosis not present

## 2022-06-18 LAB — CBC WITH DIFFERENTIAL/PLATELET
Abs Immature Granulocytes: 0.01 10*3/uL (ref 0.00–0.07)
Basophils Absolute: 0 10*3/uL (ref 0.0–0.1)
Basophils Relative: 1 %
Eosinophils Absolute: 0.2 10*3/uL (ref 0.0–0.5)
Eosinophils Relative: 5 %
HCT: 29.6 % — ABNORMAL LOW (ref 36.0–46.0)
Hemoglobin: 9 g/dL — ABNORMAL LOW (ref 12.0–15.0)
Immature Granulocytes: 0 %
Lymphocytes Relative: 25 %
Lymphs Abs: 1.1 10*3/uL (ref 0.7–4.0)
MCH: 27.4 pg (ref 26.0–34.0)
MCHC: 30.4 g/dL (ref 30.0–36.0)
MCV: 90.2 fL (ref 80.0–100.0)
Monocytes Absolute: 0.3 10*3/uL (ref 0.1–1.0)
Monocytes Relative: 7 %
Neutro Abs: 2.7 10*3/uL (ref 1.7–7.7)
Neutrophils Relative %: 62 %
Platelets: 158 10*3/uL (ref 150–400)
RBC: 3.28 MIL/uL — ABNORMAL LOW (ref 3.87–5.11)
RDW: 13 % (ref 11.5–15.5)
WBC: 4.2 10*3/uL (ref 4.0–10.5)
nRBC: 0 % (ref 0.0–0.2)

## 2022-06-18 LAB — IRON AND TIBC
Iron: 59 ug/dL (ref 28–170)
Saturation Ratios: 18 % (ref 10.4–31.8)
TIBC: 333 ug/dL (ref 250–450)
UIBC: 274 ug/dL

## 2022-06-18 LAB — FERRITIN: Ferritin: 382 ng/mL — ABNORMAL HIGH (ref 11–307)

## 2022-06-18 LAB — BASIC METABOLIC PANEL
Anion gap: 7 (ref 5–15)
BUN: 18 mg/dL (ref 8–23)
CO2: 27 mmol/L (ref 22–32)
Calcium: 8.5 mg/dL — ABNORMAL LOW (ref 8.9–10.3)
Chloride: 107 mmol/L (ref 98–111)
Creatinine, Ser: 1.01 mg/dL — ABNORMAL HIGH (ref 0.44–1.00)
GFR, Estimated: 57 mL/min — ABNORMAL LOW (ref >60)
Glucose, Bld: 102 mg/dL — ABNORMAL HIGH (ref 70–99)
Potassium: 4 mmol/L (ref 3.5–5.1)
Sodium: 141 mmol/L (ref 135–145)

## 2022-06-18 LAB — VITAMIN B12: Vitamin B-12: 151 pg/mL — ABNORMAL LOW (ref 180–914)

## 2022-06-18 MED ORDER — CYANOCOBALAMIN 1000 MCG/ML IJ SOLN
1000.0000 ug | Freq: Once | INTRAMUSCULAR | Status: AC
  Administered 2022-06-18: 1000 ug via INTRAMUSCULAR
  Filled 2022-06-18: qty 1

## 2022-06-18 MED ORDER — SODIUM CHLORIDE 0.9 % IV SOLN
200.0000 mg | Freq: Once | INTRAVENOUS | Status: AC
  Administered 2022-06-18: 200 mg via INTRAVENOUS
  Filled 2022-06-18: qty 200

## 2022-06-18 MED ORDER — MACITENTAN 10 MG PO TABS: 10.0000 mg | ORAL_TABLET | Freq: Every day | ORAL | 3 refills | Status: DC

## 2022-06-18 MED ORDER — SODIUM CHLORIDE 0.9 % IV SOLN
Freq: Once | INTRAVENOUS | Status: AC
  Filled 2022-06-18: qty 250

## 2022-06-18 NOTE — Progress Notes (Unsigned)
Patient states that she has been having concerns with vaginal itching and dysuria that has been going on for couple of months. Also stated that over the weekend had some palpitations. She has not seen her PCP since 08/2021. Increased weight of 15lbs since November with decreased appetite.

## 2022-06-18 NOTE — Assessment & Plan Note (Addendum)
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 CKD stage III  currently s/p Venofer. [name] JAN 2023 [sat 9%; ferrtin-700+- hospital/sepsis];   # Today hemoglobin is 9.0 - proceed venofer today. [SEP 2023- ferritin 400; iron sat June 14%]; continue gentle iron 1 pill a day.  Iron studies from today pending.  Also discussed the possible role of Retacrit with patient's hemoglobin does not improve.   # Mild Hypokalemia- K 3.4- continue Kdur BID.   # Etiology of anemia-likely CKD stage III [Dr.Lateef]/ [chronic diarrhea/malabsorption]-monitor closely.  #History of DVT/s/p IVC filter; ?  A. Fib-Eliquis-STABLE>   #Borderline low B12-OCT 2023- 230; s/p B12 injection.; proceed with injection q 2- 4 months.    # Vaginal itching/discharge-recommend GYN evaluation.  Referral made.  # DISPOSITION: # gyencology referral re: vaginal discharge/itching. # proceed  b12 today; and venofer today # Follow up in 2 months-MD; labs-- cbc;bmp; -possible Venofer OR retacrit;  B12 injection- Dr.B  Cc; Tammi Klippel

## 2022-06-18 NOTE — Progress Notes (Signed)
[facility] CONSULT NOTE  Patient Care Team: Glori Luis, MD as PCP - General (Family Medicine) [place] Ouch, MD as PCP - Cardiology (Cardiology) [facility] Salvia, MD as PCP - Electrophysiology (Cardiology) Carolee Rota, MD as Referring Physician (Allergy) Lupita Leash, MD as Referring Physician (Internal Medicine) Earna Coder, MD as Consulting Physician (Hematology and Oncology) Anemia CHIEF COMPLAINTS/PURPOSE OF CONSULTATION: Anemia   HEMATOLOGY HISTORY  # CHORNIC IRON DEFICIENCY ANEMIA [BL 10-11]-Jan today 21 PCP Hb-8.8; Maralyn Sago sat-5%; ferritin 11] EGD-; colonoscopy MARCH 2020 -polyp [[facility]-hold further work-up secondary to cardiac issues]; remote-EGD-?;  February 2021-LDH/haptoglobin normal; kappa lambda light chain ratio/M panel normal;  on Venofer; November 2020-CT scan question early cirrhosis/no splenomegaly.  No acute process; MARCH 2021- STOOL OCCULT x3- NEGATIVE.   #B12 deficiency-IM every 3 to 4 months  #2014 -question blood transfusion  [patient admitted for stroke]; CHF chronic -[ 2004 on 4 lits; Dr.Arida/Klein]; CT scan chest 2021 -no adenopathy; fibrotic changes; walker; CKD stage III-   #Incidental right axillary mass-s/p US biopsy [PCP] -negative for malignancy.  HISTORY OF PRESENTING ILLNESS: Patient in wheelchair on [place] O2.  Alone.  Debra Saunders 76 y.o.  female anemia/likely secondary to ?CKD stage III is here for follow-up.   Patient was recently evaluated by nephrology noted to have send anemia hemoglobin 9.  Patient was referred to [place] for further evaluation recommendations.  Patient states that she has been having concerns with vaginal itching and dysuria that has been going on for 3 months. Whitish discharge; with itching.    Also stated that over the weekend had some palpitations.   Complains of chronic fatigue. No blood in stools ; no nausea vomiting.  Continues to be compliant with her Eliquis.  Patient  continues with oxygen.    Review of Systems  Constitutional:  Positive for malaise/fatigue. Negative for chills, diaphoresis, fever and weight loss.  HENT:  Negative for nosebleeds and sore throat.   Eyes:  Negative for double vision.  Respiratory:  Positive for shortness of breath. Negative for cough, hemoptysis, sputum production and wheezing.   Cardiovascular:  Negative for chest pain, palpitations, orthopnea and leg swelling.  Gastrointestinal:  Negative for abdominal pain, blood in stool, constipation, heartburn, melena, nausea and vomiting.  Genitourinary:  Negative for dysuria, frequency and urgency.  Musculoskeletal:  Negative for back pain and joint pain.  Skin: Negative.  Negative for itching and rash.  Neurological:  Negative for dizziness, tingling, focal weakness, weakness and headaches.  Endo/Heme/Allergies:  Does not bruise/bleed easily.  Psychiatric/Behavioral:  Negative for depression. The patient is not nervous/anxious and does not have insomnia.     MEDICAL HISTORY:  Past Medical History:  Diagnosis Date   Arthritis    Atrial fibrillation, persistent (HCC)    a. s/p TEE-guided DCCV on 08/20/2017; b. 06/2021 Recurrent Afib-->amio/TEE/DCCV.   CAD (coronary artery disease)    a. 01/2012 Cath: nonobs dzs; b. 04/2014 MV: No ischemia; c. 09/2017 Cath: Minimal nonobs RCA dzs, otw nl cors.   Cardiorenal syndrome    a. 09/2017 Creat rose to 2.7 w/ diuresis-->HD x 2.   Chronic back pain    Chronic combined systolic (congestive) and diastolic (congestive) heart failure (HCC)    a. Dx 2005 @ [facility];  b. 02/2014 Echo: EF 55-60%; c. 07/2017: EF 30-35%; d. 09/2017 Echo: EF 40-45%; e. 02/2018 Echo: EF 55-60%; f. 08/2019 Echo: EF 60-65%; g. 08/2020 RHC: RA 3, RV 30/1, PA 33/11 (20), PCWP 6; f. 08/2021  Echo: EF 60-65%, no rwma, GrIDD, nl RV fxn, RVSP 40.18mmHg, mod dil LA, mod TR.   Chronic respiratory failure (HCC)    a. on home O2   Gastritis    History of intracranial hemorrhage    a. 6/14  described by neurosurgery as "small frontal posttraumatic SAH " - pt was on xarelto @ the time.   Hyperlipidemia    Hypertension    Interstitial lung disease (HCC)    Lipoma of back    Lymphedema    a. Chronic LLE edema.   NICM (nonischemic cardiomyopathy) (HCC)    a. 09/2017 Echo: EF 40-45%; b. 09/2017 Cath: minor, insignificant CAD; c. 02/2018 Echo: EF 55-60%, Gr1 DD; d. 08/2019 Echo: EF 60-65%, Gr2 DD.   Obesity    PAH (pulmonary artery hypertension) (HCC)    a. 09/2017 Echo: PASP ; b. 09/2017 RHC: RA 23, RV 84/13, PA 84/29, PCWP 20, CO 3.89, CI 1.89. LVEDP 18; c. 08/2020 RHC: RA 3, RV 30/1, PA 33/11 (20), PCWP 6. CO/CI 6.18/3.36. PVR 2.3 WU.   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 06/2021. Veg on PPM lead. Not candidate for extraction-->s/p 6 wks abx.   Sepsis with metabolic encephalopathy (HCC)    Sleep apnea    Streptococcal bacteremia    Stroke [facility]) 2014   Subarachnoid hemorrhage (HCC) 01/19/2013   Symptomatic bradycardia    a. s/p BSX PPM.   Thyroid nodule    Type II diabetes mellitus (HCC)    Urinary incontinence     SURGICAL HISTORY: Past Surgical History:  Procedure Laterality Date   ABDOMINAL HYSTERECTOMY  1969   BREAST BIOPSY Right 10/28/2019   lymph node bx, [place] Bx, pending path    CARDIAC CATHETERIZATION  2013   @ [facility]: No obstructive CAD: Only 20% ostial left circumflex.   CARDIOVERSION N/A 10/12/2017   Procedure: CARDIOVERSION;  Surgeon: Antonieta Iba, MD;  Location: [facility] ORS;  Service: Cardiovascular;  Laterality: N/A;   CARDIOVERSION N/A 07/04/2021   Procedure: CARDIOVERSION;  Surgeon: Laurey Morale, MD;  Location: [facility] ENDOSCOPY;  Service: Cardiovascular;  Laterality: N/A;   CATARACT EXTRACTION W/PHACO Right 09/19/2020   Procedure: CATARACT EXTRACTION PHACO AND INTRAOCULAR LENS PLACEMENT (IOC) RIGHT DIABETIC 3.95 00:43.0 9.2%;  Surgeon: Lockie Mola, MD;  Location: [facility] SURGERY CNTR;   Service: Ophthalmology;  Laterality: Right;  Pt requests to be last sleep apnea   CATARACT EXTRACTION W/PHACO Left 10/03/2020   Procedure: CATARACT EXTRACTION PHACO AND INTRAOCULAR LENS PLACEMENT (IOC) LEFT DIABETIC;  Surgeon: Lockie Mola, MD;  Location: [facility] SURGERY CNTR;  Service: Ophthalmology;  Laterality: Left;  3.25 0:59.3 5.5%   CHOLECYSTECTOMY     COLONOSCOPY WITH PROPOFOL N/A 09/10/2018   Procedure: COLONOSCOPY WITH PROPOFOL;  Surgeon: Scot Jun, MD;  Location: [facility] ENDOSCOPY;  Service: Endoscopy;  Laterality: N/A;   CYST REMOVAL TRUNK  2002   BACK   DIALYSIS/PERMA CATHETER INSERTION N/A 10/06/2017   Procedure: DIALYSIS/PERMA CATHETER INSERTION;  Surgeon: Renford Dills, MD;  Location: [facility] INVASIVE CV LAB;  Service: Cardiovascular;  Laterality: N/A;   INSERT / REPLACE / REMOVE PACEMAKER     lipoma removal  2014   back   PACEMAKER INSERTION  2015   [employer] dual chamber pacemaker implanted by Dr Graciela Husbands for symptomatic bradycardia   PERMANENT PACEMAKER INSERTION N/A 01/09/2014   Procedure: PERMANENT PACEMAKER INSERTION;  Surgeon: [facility] Salvia, MD;  Location: [facility] CATH LAB;  Service: Cardiovascular;  Laterality: N/A;   RIGHT HEART CATH N/A 04/19/2018   Procedure: RIGHT HEART CATH;  Surgeon: Laurey Morale, MD;  Location: [facility] INVASIVE CV LAB;  Service: Cardiovascular;  Laterality: N/A;   RIGHT HEART CATH N/A 08/30/2020   Procedure: RIGHT HEART CATH;  Surgeon: Laurey Morale, MD;  Location: [facility] INVASIVE CV LAB;  Service: Cardiovascular;  Laterality: N/A;   RIGHT HEART CATH N/A 02/11/2022   Procedure: RIGHT HEART CATH;  Surgeon: Laurey Morale, MD;  Location: [facility] INVASIVE CV LAB;  Service: Cardiovascular;  Laterality: N/A;   RIGHT/LEFT HEART CATH AND CORONARY ANGIOGRAPHY N/A 10/19/2017   Procedure: RIGHT/LEFT HEART CATH AND CORONARY ANGIOGRAPHY;  Surgeon: [place] Ouch, MD;  Location: [facility] INVASIVE CV LAB;  Service: Cardiovascular;  Laterality: N/A;    TEE WITHOUT CARDIOVERSION N/A 08/20/2017   Procedure: TRANSESOPHAGEAL ECHOCARDIOGRAM (TEE) & Direct current cardioversion;  Surgeon: Antonieta Iba, MD;  Location: [facility] ORS;  Service: Cardiovascular;  Laterality: N/A;   TEE WITHOUT CARDIOVERSION N/A 06/28/2021   Procedure: TRANSESOPHAGEAL ECHOCARDIOGRAM (TEE);  Surgeon: Antonieta Iba, MD;  Location: [facility] ORS;  Service: Cardiovascular;  Laterality: N/A;   TEE WITHOUT CARDIOVERSION N/A 07/04/2021   Procedure: TRANSESOPHAGEAL ECHOCARDIOGRAM (TEE);  Surgeon: Laurey Morale, MD;  Location: [facility] ENDOSCOPY;  Service: Cardiovascular;  Laterality: N/A;   TRIGGER FINGER RELEASE      SOCIAL HISTORY: Social History   Socioeconomic History   Marital status: Divorced    Spouse name: Not on file   Number of children: 4   Years of education: 12   Highest education level: High school graduate  Occupational History   Occupation: Retired- factory work    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 Has 4 children. No pets.      Work - Hotel manager, retired      Diet - regular      02/14/2019: reports has Meals On Wheels delivered to her home;       Remote hx of smoking > 30 years; No alcohol-    Social Determinants of Health   Financial Resource Strain: Low Risk  (02/19/2022)   Overall Financial Resource Strain (CARDIA)    Difficulty of Paying Living Expenses: Not hard at all  Food Insecurity: No Food Insecurity (05/22/2022)   Hunger Vital Sign    Worried About Running Out of Food in the Last Year: Never true    Ran  Out of Food in the Last Year: Never true  Transportation Needs: Unmet Transportation Needs (06/18/2022)   PRAPARE - Transportation    Lack of Transportation (Medical): Yes    Lack of Transportation (Non-Medical): Yes  Physical Activity: Inactive (01/05/2018)   Exercise Vital Sign    Days of Exercise per Week: 0 days    Minutes of Exercise per Session: 0 min  Stress: No Stress Concern Present (02/19/2022)   [employer] of Occupational Health - Occupational Stress Questionnaire    Feeling of  Stress : Not at all  Social Connections: Socially Isolated (02/19/2022)   Social Connection and Isolation Panel [NHANES]    Frequency of Communication with Friends and Family: More than three times a week    Frequency of Social Gatherings with Friends and Family: Once a week    Attends Religious Services: Never    Database administrator or Organizations: No    Attends Banker Meetings: Never    Marital Status: Divorced  Catering manager Violence: Not At Risk (02/19/2022)   Humiliation, Afraid, Rape, and Kick questionnaire    Fear of Current or Ex-Partner: No    Emotionally Abused: No    Physically Abused: No    Sexually Abused: No    FAMILY HISTORY: Family History  Problem Relation Age of Onset   Heart disease Mother    Hypertension Mother    COPD Mother        was a smoker   Thyroid disease Mother    Hypertension Father    Hypertension Sister    Thyroid disease Sister    Breast cancer Sister    Hypertension Sister    Thyroid disease Sister    Bone cancer Sister    Stroke Son    Breast cancer Maternal Aunt    Kidney disease Neg Hx    Bladder Cancer Neg Hx     ALLERGIES:  is allergic to aspirin and other.  MEDICATIONS:  Current Outpatient Medications  Medication Sig Dispense Refill   acetaminophen (TYLENOL) 325 MG tablet Take 325-650 mg by mouth every 6 (six) hours as needed for mild pain.     albuterol (ACCUNEB) 0.63 MG/3ML nebulizer solution Take 1 ampule by  nebulization every 6 (six) hours as needed for wheezing.     apixaban (ELIQUIS) 5 MG TABS tablet Take 1 tablet (5 mg total) by mouth 2 (two) times daily. 180 tablet 0   Difluprednate 0.05 % EMUL Place 1 drop into both eyes 2 (two) times daily.     gabapentin (NEURONTIN) 300 MG capsule Take 2 capsules (600 mg total) by mouth 3 (three) times daily. (Patient taking differently: Take 300 mg by mouth 2 (two) times daily. once a day) 540 capsule 1   macitentan (OPSUMIT) 10 MG tablet Take 1 tablet (10 mg total) by mouth daily. 90 tablet 3   meclizine (ANTIVERT) 25 MG tablet Take 1 tablet (25 mg total) by mouth 3 (three) times daily as needed for dizziness. 30 tablet 1   Menthol, Topical Analgesic, (BIOFREEZE EX) Apply 1 application topically 4 (four) times daily as needed (for pain).     midodrine (PROAMATINE) 5 MG tablet Take 5 mg by mouth 3 (three) times daily. Taking once a day, not laying down for 4 hrs after     potassium chloride SA (KLOR-CON M) 20 MEQ tablet Take 2 tablets (40 mEq total) by mouth daily. (Patient taking differently: Take 20 mEq by mouth once.) 60 tablet 2   rosuvastatin (CRESTOR) 10 MG tablet Take 1 tablet (10 mg total) by mouth daily. 90 tablet 3   Selexipag (UPTRAVI) 1600 MCG TABS Take 1 tablet (1,600 mcg total) by mouth 2 (two) times daily. 60 tablet 11   sildenafil (REVATIO) 20 MG tablet Take 1 tablet (20 mg total) by mouth 3 (three) times daily. 90 tablet 3   spironolactone (ALDACTONE) 25 MG tablet Take 0.5 tablet (12.5 mg) by mouth once daily (Patient taking differently: 25 mg. She has been taking a whole pill)  torsemide (DEMADEX) 20 MG tablet Take 3 tablets (60 mg total) by mouth 2 (two) times daily. Patient takes 3 tablets by mouth twice a day. (Patient taking differently: Take 40 mg by mouth 2 (two) times daily. Been taking 40mg  twice a day) 180 tablet 6   benzonatate (TESSALON) 200 MG capsule Take 1 capsule (200 mg total) by mouth 3 (three) times daily as needed for cough.  (Patient not taking: Reported on 04/30/2022) 20 capsule 0   cefadroxil (DURICEF) 500 MG capsule Take 1 capsule (500 mg total) by mouth 2 (two) times daily. (Patient not taking: Reported on 05/29/2022) 180 capsule 3   doxycycline (ADOXA) 100 MG tablet Take 1 tablet (100 mg total) by mouth 2 (two) times daily. (Patient not taking: Reported on 06/18/2022) 12 tablet 0   metolazone (ZAROXOLYN) 2.5 MG tablet Take 2.5 mg by mouth every Monday, Wednesday, and Friday. (Patient not taking: Reported on 04/30/2022)     No current facility-administered medications for this visit.      PHYSICAL EXAMINATION:   Vitals:   06/18/22 1433  BP: (!) 122/51  Pulse: 63  Resp: 18  Temp: 98.6 F (37 C)  SpO2: 100%   Filed Weights   06/18/22 1433  Weight: 200 lb (90.7 kg)    Physical Exam Constitutional:      Comments: Patient in wheelchair.  Alone.  She is on O2 nasal cannula.  HENT:     Head: Normocephalic and atraumatic.     Mouth/Throat:     Pharynx: No oropharyngeal exudate.  Eyes:     Pupils: Pupils are equal, round, and reactive to light.  Cardiovascular:     Rate and Rhythm: Normal rate and regular rhythm.  Pulmonary:     Effort: No respiratory distress.     Breath sounds: No wheezing.     Comments: Decreased air entry bilaterally.  No wheeze or crackles. Abdominal:     General: Bowel sounds are normal. There is no distension.     Palpations: Abdomen is soft. There is no mass.     Tenderness: There is no abdominal tenderness. There is no guarding or rebound.  Musculoskeletal:        General: No tenderness. Normal range of motion.     Cervical back: Normal range of motion and neck supple.  Skin:    General: Skin is warm.  Neurological:     Mental Status: She is alert and oriented to person, place, and time.  Psychiatric:        Mood and Affect: Affect normal.     LABORATORY DATA:  I have reviewed the data as listed Lab Results  Component Value Date   WBC 4.2 06/18/2022   HGB  9.0 (L) 06/18/2022   HCT 29.6 (L) 06/18/2022   MCV 90.2 06/18/2022   PLT 158 06/18/2022   Recent Labs    06/25/21 1350 06/25/21 2243 06/28/21 0500 06/29/21 1254 06/29/21 1608 06/30/21 0522 08/29/21 1037 09/26/21 1306 10/07/21 1735 11/25/21 0346 12/02/21 1254 01/28/22 1540 02/11/22 1037 04/02/22 1229 04/22/22 1424 06/18/22 1420  NA 126*   < > 134*   < >  --    < > 139 140   < > 142   < > 140   < > 137 140 141  K 4.9   < > 3.8   < >  --    < > 4.7 2.9*   < > 3.9   < > 3.2*   < > 3.4* 5.4* 4.0  CL 98   < > 100   < >  --    < > 99 101   < > 104   < > 95*   < > 101 107 107  CO2 20*   < > 30   < >  --    < > 27 31   < > 29   < > 36*   < > 28 27 27   GLUCOSE 171*   < > 242*   < >  --    < > 113* 110*   < > 129*   < > 106*   < > 116* 126* 102*  BUN 74*   < > 76*   < >  --    < > 27 14   < > 20   < > 59*   < > 34* 40* 18  CREATININE 3.11*   < > 1.46*   < >  --    < > 1.07* 1.19*   < > 1.23*   < > 1.66*   < > 1.25* 1.69* 1.01*  CALCIUM 7.9*   < > 7.5*   < >  --    < > 9.3 8.9   < > 9.2   < > 9.1   < > 8.6* 9.6 8.5*  GFRNONAA 15*   < > 37*   < >  --    < >  --  47*   < >  --    < > 31*   < > 44* 31* 57*  PROT 6.2*   < > 6.3*   < > 6.8   < > 7.2 6.9  --  6.6  --  7.1  --   --   --   --   ALBUMIN 2.2*   < > 2.4*   < > 2.3*   < > 3.8 3.3*  --   --   --  3.8  --   --   --   --   AST 21   < > 13*   < > 13*   < > 19 13*  --  12  --  13*  --   --   --   --   ALT 13   < > 12   < > 12   < > 22 9  --  10  --  12  --   --   --   --   ALKPHOS 126   < > 123   < > 122   < > 266* 93  --   --   --  97  --   --   --   --   BILITOT 3.0*   < > 1.0   < > 0.9   < > 0.2 0.6  --  0.4  --  0.9  --   --   --   --   BILIDIR 1.7*  --  0.3*  --  0.3*  --   --   --   --   --   --   --   --   --   --   --   IBILI 1.3*  --  0.7  --  0.6  --   --   --   --   --   --   --   --   --   --   --    < > =  values in this interval not displayed.     CUP PACEART REMOTE DEVICE CHECK  Result Date: 05/27/2022 Scheduled remote  reviewed. Normal device function.  2 ATR's, brief, PAT 5 PMT appropriately treated and terminated Next remote 91 days. LA    Iron deficiency # Chronic Anemia [BL10-11]-iron deficiency/? CKD stage III  currently s/p Venofer. [name] JAN 2023 [sat 9%; ferrtin-700+- hospital/sepsis];   # Today hemoglobin is 9.0 - proceed venofer today. [SEP 2023- ferritin 400; iron sat June 14%]; continue gentle iron 1 pill a day.  Iron studies from today pending.  Also discussed the possible role of Retacrit with patient's hemoglobin does not improve.   # Mild Hypokalemia- K 3.4- continue Kdur BID.   # Etiology of anemia-likely CKD stage III [Dr.Lateef]/ [chronic diarrhea/malabsorption]-monitor closely.  #History of DVT/s/p IVC filter; ?  A. Fib-Eliquis-STABLE>   #Borderline low B12-OCT 2023- 230; s/p B12 injection.; proceed with injection q 2- 4 months.    # DISPOSITION: # gyencology referral re: vaginal discharge/itching. # proceed  b12 today; and venofer today # Follow up in 2 months-MD; labs-- cbc;bmp; -possible Venofer OR retacrit;  B12 injection- Dr.B  Cc; Harmon Dun  All questions were answered. The patient knows to call the clinic with any problems, questions or concerns.    Earna Coder, MD 06/19/2022 7:59 AM

## 2022-06-19 ENCOUNTER — Other Ambulatory Visit (HOSPITAL_COMMUNITY): Payer: Self-pay

## 2022-06-19 ENCOUNTER — Encounter: Payer: Self-pay | Admitting: Internal Medicine

## 2022-06-19 ENCOUNTER — Encounter (HOSPITAL_COMMUNITY): Payer: Self-pay

## 2022-06-19 NOTE — Progress Notes (Signed)
Today seen Debra Saunders.  She states feeling ok but could be better.  She is complaining of feeling full or nausea when eats.  She states her weight is up and abdomen is tight.  Her leg is swollen more than normal.  She has only been taking 2 torsemide twice a day, she states hard to take 3.  Advised her for her fluid she needs to take 3 twice a day and will check on her in a few days to see if weight came down.  She states drinking right at 60 oz of fluid a day.  She states not eating a lot of salty foods.  Lungs are clear and oxygen level is good on her normal oxygen.  She is not complaining or increased shortness of breath.  Blood pressure for her is elevated.  She is not taking the midodrine.  She only takes when blood pressure is low.  She states her eyes are still bothering her from her cataract surgery 8 months ago.  She has everything for daily living and good family support.  She is up and ambulatory with walker today.  Refilled 2 weeks of medications and called in sildinafil.  She states been taking all her medications.  Discussed watching high sodium foods and elevate.  Importance of taking all her torsemide.  Will call and get weight on Tuesday when I return to work.  Will continue to visit for heart failure, diet and medication management.   [place] EMT-paramedic [phone]

## 2022-06-24 NOTE — Progress Notes (Signed)
Remote pacemaker transmission.   

## 2022-06-25 ENCOUNTER — Telehealth (HOSPITAL_COMMUNITY): Payer: Self-pay

## 2022-06-25 NOTE — Telephone Encounter (Signed)
Today received a phone call from [place], she states her weight has came down and breathing is a little easier.  She states feels a little better.  She said she has been taking the right dose of torsemide for past several days which is 3 twice a day.  Her weight today was 190 lbs.    [place] EMT-paramedic [phone]

## 2022-07-01 ENCOUNTER — Encounter: Payer: Self-pay | Admitting: Obstetrics

## 2022-07-01 ENCOUNTER — Other Ambulatory Visit (HOSPITAL_COMMUNITY)
Admission: RE | Admit: 2022-07-01 | Discharge: 2022-07-01 | Disposition: A | Discharge status: Home / Self Care | Payer: Medicare HMO | Source: Ambulatory Visit | Attending: Obstetrics | Admitting: Obstetrics

## 2022-07-01 ENCOUNTER — Other Ambulatory Visit (HOSPITAL_COMMUNITY)
Admission: RE | Admit: 2022-07-01 | Discharge: 2022-07-01 | Disposition: A | Discharge status: Home / Self Care | Payer: Medicare HMO | Source: Ambulatory Visit

## 2022-07-01 ENCOUNTER — Ambulatory Visit (INDEPENDENT_AMBULATORY_CARE_PROVIDER_SITE_OTHER): Payer: Medicare HMO | Admitting: Obstetrics

## 2022-07-01 VITALS — BP 110/50 | Ht 60.0 in | Wt 190.0 lb

## 2022-07-01 DIAGNOSIS — N898 Other specified noninflammatory disorders of vagina: Secondary | ICD-10-CM | POA: Diagnosis not present

## 2022-07-01 DIAGNOSIS — N9089 Other specified noninflammatory disorders of vulva and perineum: Secondary | ICD-10-CM

## 2022-07-01 DIAGNOSIS — N763 Subacute and chronic vulvitis: Secondary | ICD-10-CM | POA: Diagnosis not present

## 2022-07-01 NOTE — Progress Notes (Signed)
GYN ENCOUNTER   Subjective  HPI: Debra Saunders is a 77 y.o. G4P0 who presents today for evaluation of vaginal itching and bumps. She has a history of angiokeratoma. She reports that for approximately the past 4 months, she has noted itchy, painful bumps on her vulva that sometimes bleed when she washes. She has had a thick yellowish-white discharge. She also reports that she is incontinent and often has difficulty keeping her vulva dry. She reports frequent skin tears and bleeding.  Past Medical History:  Diagnosis Date   Arthritis    Atrial fibrillation, persistent ([place])    a. s/p TEE-guided DCCV on 08/20/2017; b. 06/2021 Recurrent Afib-->amio/TEE/DCCV.   CAD (coronary artery disease)    a. 01/2012 Cath: nonobs dzs; b. 04/2014 MV: No ischemia; c. 09/2017 Cath: Minimal nonobs RCA dzs, otw nl cors.   Cardiorenal syndrome    a. 09/2017 Creat rose to 2.7 w/ diuresis-->HD x 2.   Chronic back pain    Chronic combined systolic (congestive) and diastolic (congestive) heart failure ([place])    a. Dx 2005 @ [facility];  b. 02/2014 Echo: EF 55-60%; c. 07/2017: EF 30-35%; d. 09/2017 Echo: EF 40-45%; e. 02/2018 Echo: EF 55-60%; f. 08/2019 Echo: EF 60-65%; g. 08/2020 RHC: RA 3, RV 30/1, PA 33/11 (20), PCWP 6; f. 08/2021 Echo: EF 60-65%, no rwma, GrIDD, nl RV fxn, RVSP 40.44mHg, mod dil LA, mod TR.   Chronic respiratory failure (HCC)    a. on home O2   Gastritis    History of intracranial hemorrhage    a. 6/14 described by neurosurgery as "small frontal posttraumatic SAH " - pt was on xarelto @ the time.   Hyperlipidemia    Hypertension    Interstitial lung disease (HCC)    Lipoma of back    Lymphedema    a. Chronic LLE edema.   NICM (nonischemic cardiomyopathy) ([place]    a. 09/2017 Echo: EF 40-45%; b. 09/2017 Cath: minor, insignificant CAD; c. 02/2018 Echo: EF 55-60%, Gr1 DD; d. 08/2019 Echo: EF 60-65%, Gr2 DD.   Obesity    PAH (pulmonary artery hypertension) ([place]    a. 09/2017 Echo: PASP 67mg; b. 09/2017 RHC: RA  23, RV 84/13, PA 84/29, PCWP 20, CO 3.89, CI 1.89. LVEDP 18; c. 08/2020 RHC: RA 3, RV 30/1, PA 33/11 (20), PCWP 6. CO/CI 6.18/3.36. PVR 2.3 WU.   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 06/2021. Veg on PPM lead. Not candidate for extraction-->s/p 6 wks abx.   Sepsis with metabolic encephalopathy ([place]   Sleep apnea    Streptococcal bacteremia    [facility]   Subarachnoid hemorrhage ([place]   Symptomatic bradycardia    a. s/p BSX PPM.   Thyroid nodule    Type II diabetes mellitus ([place]   Urinary incontinence    Past Surgical History:  Procedure Laterality Date   ABDOMINAL HYSTERECTOMY  1969   BREAST BIOPSY Right 10/28/2019   lymph node bx, [facility], pending path    CARDIAC CATHETERIZATION  2013   @ [facility] obstructive CAD: Only 20% ostial left circumflex.   CARDIOVERSION N/A 10/12/2017   Procedure: CARDIOVERSION;  Surgeon: GoMinna MerrittsMD;  [place];  Service: Cardiovascular;  Laterality: N/A;   CARDIOVERSION N/A 07/04/2021   Procedure: CARDIOVERSION;  Surgeon: McLarey DresserMD;  [facility];  Service: Cardiovascular;  Laterality: N/A;   CATARACT EXTRACTION W/PHACO Right 09/19/2020  Procedure: CATARACT EXTRACTION PHACO AND INTRAOCULAR LENS PLACEMENT (IOC) RIGHT DIABETIC 3.95 00:43.0 9.2%;  Surgeon: Leandrew Koyanagi, MD;  Location: [place];  Service: Ophthalmology;  Laterality: Right;  Pt requests to be last sleep apnea   CATARACT EXTRACTION W/PHACO Left 10/03/2020   Procedure: CATARACT EXTRACTION PHACO AND INTRAOCULAR LENS PLACEMENT ([place]) LEFT DIABETIC;  Surgeon: Leandrew Koyanagi, MD;  Location: [place];  Service: Ophthalmology;  Laterality: Left;  3.25 0:59.3 5.5%   CHOLECYSTECTOMY     COLONOSCOPY WITH PROPOFOL N/A 09/10/2018   Procedure: COLONOSCOPY WITH PROPOFOL;  Surgeon: Manya Silvas, MD;  Location[facility]COPY;  Service: Endoscopy;   Laterality: N/A;   CYST REMOVAL TRUNK  2002   BACK   DIALYSIS/PERMA CATHETER INSERTION N/A 10/06/2017   Procedure: DIALYSIS/PERMA CATHETER INSERTION;  Surgeon: Katha Cabal, MD;  Location: [place] CV LAB;  Service: Cardiovascular;  Laterality: N/A;   INSERT / REPLACE / REMOVE PACEMAKER     lipoma removal  2014   back   PACEMAKER INSERTION  2015   [employer] dual chamber pacemaker implanted by Dr Caryl Comes for symptomatic bradycardia   PERMANENT PACEMAKER INSERTION N/A 01/09/2014   Procedure: PERMANENT PACEMAKER INSERTION;  Surgeon: Deboraha Sprang, MD;  Location: [facility] CATH LAB;  Service: Cardiovascular;  Laterality: N/A;   RIGHT HEART CATH N/A 04/19/2018   Procedure: RIGHT HEART CATH;  Surgeon: Larey Dresser, MD;  Location: [place] CV LAB;  Service: Cardiovascular;  Laterality: N/A;   RIGHT HEART CATH N/A 08/30/2020   Procedure: RIGHT HEART CATH;  Surgeon: Larey Dresser, MD;  Location: [place] CV LAB;  Service: Cardiovascular;  Laterality: N/A;   RIGHT HEART CATH N/A 02/11/2022   Procedure: RIGHT HEART CATH;  Surgeon: Larey Dresser, MD;  Location: [place] CV LAB;  Service: Cardiovascular;  Laterality: N/A;   RIGHT/LEFT HEART CATH AND CORONARY ANGIOGRAPHY N/A 10/19/2017   Procedure: RIGHT/LEFT HEART CATH AND CORONARY ANGIOGRAPHY;  Surgeon: Wellington Hampshire, MD;  Location: [place] CV LAB;  Service: Cardiovascular;  Laterality: N/A;   TEE WITHOUT CARDIOVERSION N/A 08/20/2017   Procedure: TRANSESOPHAGEAL ECHOCARDIOGRAM (TEE) & Direct current cardioversion;  Surgeon: Minna Merritts, MD;  Location[facility] ORS;  Service: Cardiovascular;  Laterality: N/A;   TEE WITHOUT CARDIOVERSION N/A 06/28/2021   Procedure: TRANSESOPHAGEAL ECHOCARDIOGRAM (TEE);  Surgeon: Minna Merritts, MD;  Location: [facility] ORS;  Service: Cardiovascular;  Laterality: N/A;   TEE WITHOUT CARDIOVERSION N/A 07/04/2021   Procedure: TRANSESOPHAGEAL ECHOCARDIOGRAM (TEE);  Surgeon: Larey Dresser, MD;   Location: [facility] ENDOSCOPY;  Service: Cardiovascular;  Laterality: N/A;   TRIGGER FINGER RELEASE     OB History     Gravida  4   Para      Term      Preterm      AB      Living  4      SAB      IAB      Ectopic      Multiple      Live Births           Obstetric Comments  Age first pregnancy 18        Allergies  Allergen Reactions   Aspirin Anaphylaxis and Hives    Pt states that she is unable to take because she had bleeding in her brain   Other Other (See Comments)    Pt states that she is unable to take blood thinners because she had bleeding in her  brain; Blood Thinners-per doctor at [facility]     Negative except as noted in HPI History obtained from the patient  Objective  BP (!) 110/50   Ht 5' (1.524 m)   Wt 190 lb (86.2 kg)   BMI 37.11 kg/m   Physical examination   Pelvic:   Vulva: Rugated exophytic lesions with condyloma-like appearance noted on left labia and above clitoral hood. General edema of vulva.  Vagina: No lesions or abnormalities noted. Swab collected.  Support: Normal pelvic support.  Urethra No masses tenderness or scarring.  Meatus Normal size without lesions or prolapse.  Cervix: Not examined  Anus: Not examined.  Perineum: Normal exam.  No lesions.     Consulted with Dr. Amalia Hailey, who also examined the Ms. Meditz and was present for the biopsy.  Procedure: Vulvar Biopsy  Area to be biopsied was cleansed with betadine solution. An injection of lidocaine with epinephrine was given. The leading edge of lesion was biopsied using a cervical biopsy forceps. Hemostasis achieved with pressure and silver nitrate. Patient tolerated the procedure well. Specimen to pathology.  Assessment 1) Vulvar lesion 2) Vaginal discharge   Plan 1) Biopsied lesion sent to pathology. F/u based on results. After care reviewed with patient. 2) Swab collected. Discussed perineal hygiene and avoiding prolonged exposure to moisture and  irritants.   Lloyd Huger, CNM

## 2022-07-02 ENCOUNTER — Encounter: Payer: Self-pay | Admitting: Obstetrics

## 2022-07-03 ENCOUNTER — Other Ambulatory Visit (HOSPITAL_COMMUNITY): Payer: Self-pay

## 2022-07-03 ENCOUNTER — Other Ambulatory Visit (HOSPITAL_COMMUNITY): Payer: Self-pay | Admitting: Cardiology

## 2022-07-03 DIAGNOSIS — I4819 Other persistent atrial fibrillation: Secondary | ICD-10-CM

## 2022-07-03 LAB — CERVICOVAGINAL ANCILLARY ONLY
Bacterial Vaginitis (gardnerella): NEGATIVE
Candida Glabrata: NEGATIVE
Candida Vaginitis: POSITIVE — AB
Comment: NEGATIVE
Comment: NEGATIVE
Comment: NEGATIVE

## 2022-07-03 LAB — SURGICAL PATHOLOGY

## 2022-07-04 ENCOUNTER — Other Ambulatory Visit: Payer: Self-pay | Admitting: Obstetrics

## 2022-07-04 ENCOUNTER — Telehealth: Payer: Self-pay | Admitting: Obstetrics

## 2022-07-04 ENCOUNTER — Encounter: Payer: Self-pay | Admitting: Obstetrics

## 2022-07-04 DIAGNOSIS — B379 Candidiasis, unspecified: Secondary | ICD-10-CM

## 2022-07-04 MED ORDER — FLUCONAZOLE 150 MG PO TABS: 150.0000 mg | ORAL_TABLET | Freq: Once | ORAL | 0 refills | Status: AC

## 2022-07-04 NOTE — Telephone Encounter (Signed)
-----  Message from Landis Gandy, [place] sent at 07/04/2022  4:00 PM EST ----- Regarding: FW: Biopsy appt  ----- Message ----- From: Lurlean Horns, CNM Sent: 07/04/2022  12:10 PM EST To: Landis Gandy, CMA Subject: Biopsy appt                                    Hi Collene Mares,  Could you please get [name] scheduled for another biopsy with Dr. Amalia Hailey?  Her biopsy came back negative, but we are a little concerned that the sample was not large enough so it needs to be repeated with more numbing and a larger area. I sent her a MyChart message with this information but want to make sure she gets a phone call to schedule.  Thank you!!  Missy

## 2022-07-04 NOTE — Progress Notes (Signed)
Biopsy results came back as normal tissue. Consulted with Dr. Amalia Hailey, who recommends having Debra Saunders return to biopsy a larger area. +yeast on swab. Rx for Diflucan sent. Debra Saunders notified of recommendation and swab results via [place].  M. Norberto Sorenson, CNM

## 2022-07-04 NOTE — Telephone Encounter (Signed)
I contacted patient via phone. I left message for patient to call back to be scheduled. Was going to offer 11:30 1/17 with Dr .Rebekah Chesterfield. Ok per [facility]. Due to patient needing repeat biopsy.

## 2022-07-08 ENCOUNTER — Encounter (HOSPITAL_COMMUNITY): Payer: Self-pay

## 2022-07-08 NOTE — Progress Notes (Signed)
Had a home visit with [name].  She states doing ok.  She has been to her appts.  She is aware of up coming appts.  She has all her medications and aware of how to take them.  Filled up 2 weeks of medications for her.  She is able to do her med boxes but she likes them to be done.  Her weight is coming down.  She states appetite is good. She has been doing more around the home.  She has good family support.  She has everything for daily living.  She denies any problems such as chest pain, headaches, or increased shortness of breath.  She still has dizziness and been to neurology for this.  Edema is going down in legs.  She tries to watch high sodium foods and fluids 60 ounces a day.  Will keep visiting for heart failure, diet and medication management.   [place] [phone]

## 2022-07-09 NOTE — Telephone Encounter (Signed)
Patient is scheduled for 1/24 with Dr. Amalia Hailey

## 2022-07-15 ENCOUNTER — Telehealth: Payer: Self-pay

## 2022-07-15 ENCOUNTER — Ambulatory Visit: Payer: Medicare HMO | Attending: Internal Medicine | Admitting: Internal Medicine

## 2022-07-15 ENCOUNTER — Encounter: Payer: Self-pay | Admitting: Internal Medicine

## 2022-07-15 VITALS — Ht 60.0 in

## 2022-07-15 DIAGNOSIS — R001 Bradycardia, unspecified: Secondary | ICD-10-CM | POA: Diagnosis not present

## 2022-07-15 DIAGNOSIS — I4819 Other persistent atrial fibrillation: Secondary | ICD-10-CM

## 2022-07-15 DIAGNOSIS — Z950 Presence of cardiac pacemaker: Secondary | ICD-10-CM

## 2022-07-15 DIAGNOSIS — I5032 Chronic diastolic (congestive) heart failure: Secondary | ICD-10-CM

## 2022-07-15 MED ORDER — MIDODRINE HCL 5 MG PO TABS: ORAL_TABLET | ORAL | 3 refills | Status: DC

## 2022-07-15 NOTE — Patient Instructions (Addendum)
Medication Instructions:  Your physician has recommended you make the following change in your medication:   ** Resume Proamatine '5mg'$  - 1 tablet at 12noon, 4pm and 8pm  *If you need a refill on your cardiac medications before your next appointment, please call your pharmacy*   Lab Work: None ordered.  If you have labs (blood work) drawn today and your tests are completely normal, you will receive your results only by: [place] (if you have MyChart) OR A paper copy in the mail If you have any lab test that is abnormal or we need to change your treatment, we will call you to review the results.   Testing/Procedures: None ordered.    Follow-Up: At [facility], you and your health needs are our priority.  As part of our continuing mission to provide you with exceptional heart care, we have created designated Provider Care Teams.  These Care Teams include your primary Cardiologist (physician) and Advanced Practice Providers (APPs -  Physician Assistants and Nurse Practitioners) who all work together to provide you with the care you need, when you need it.  We recommend signing up for the patient portal called "MyChart".  Sign up information is provided on this After Visit Summary.  MyChart is used to connect with patients for Virtual Visits (Telemedicine).  Patients are able to view lab/test results, encounter notes, upcoming appointments, etc.  Non-urgent messages can be sent to your provider as well.   To learn more about what you can do with MyChart, go to [email or web address].    Your next appointment:   6 months with Dr Caryl Comes  Other Instructions - Per Dr Caryl Comes  raise head of bed on block 4 "  try decreasing fluids and using hard candy to assuage thirst

## 2022-07-15 NOTE — Telephone Encounter (Signed)
  Patient Consent for Virtual Visit        Debra Saunders has provided verbal consent on 07/15/2022 for a virtual visit (video or telephone).   CONSENT FOR VIRTUAL VISIT FOR:  Debra Saunders  By participating in this virtual visit I agree to the following:  I hereby voluntarily request, consent and authorize [place] and its employed or contracted physicians, physician assistants, nurse practitioners or other licensed health care professionals (the Practitioner), to provide me with telemedicine health care services (the "Services") as deemed necessary by the treating Practitioner. I acknowledge and consent to receive the Services by the Practitioner via telemedicine. I understand that the telemedicine visit will involve communicating with the Practitioner through live audiovisual communication technology and the disclosure of certain medical information by electronic transmission. I acknowledge that I have been given the opportunity to request an in-person assessment or other available alternative prior to the telemedicine visit and am voluntarily participating in the telemedicine visit.  I understand that I have the right to withhold or withdraw my consent to the use of telemedicine in the course of my care at any time, without affecting my right to future care or treatment, and that the Practitioner or I may terminate the telemedicine visit at any time. I understand that I have the right to inspect all information obtained and/or recorded in the course of the telemedicine visit and may receive copies of available information for a reasonable fee.  I understand that some of the potential risks of receiving the Services via telemedicine include:  Delay or interruption in medical evaluation due to technological equipment failure or disruption; Information transmitted may not be sufficient (e.g. poor resolution of images) to allow for appropriate medical decision making by the  Practitioner; and/or  In rare instances, security protocols could fail, causing a breach of personal health information.  Furthermore, I acknowledge that it is my responsibility to provide information about my medical history, conditions and care that is complete and accurate to the best of my ability. I acknowledge that Practitioner's advice, recommendations, and/or decision may be based on factors not within their control, such as incomplete or inaccurate data provided by me or distortions of diagnostic images or specimens that may result from electronic transmissions. I understand that the practice of medicine is not an exact science and that Practitioner makes no warranties or guarantees regarding treatment outcomes. I acknowledge that a copy of this consent can be made available to me via my patient portal ([place]), or I can request a printed copy by calling the office of [place].    I understand that my insurance will be billed for this visit.   I have read or had this consent read to me. I understand the contents of this consent, which adequately explains the benefits and risks of the Services being provided via telemedicine.  I have been provided ample opportunity to ask questions regarding this consent and the Services and have had my questions answered to my satisfaction. I give my informed consent for the services to be provided through the use of telemedicine in my medical care

## 2022-07-15 NOTE — Progress Notes (Signed)
Electrophysiology TeleHealth Note   Due to national recommendations of social distancing due to COVID 19, an audio/video telehealth visit is felt to be most appropriate for this patient at this time.  See MyChart message from today for the patient's consent to telehealth for [facility].   Date:  07/15/2022   ID:  Debra Saunders, DOB Jun 25, 1945, MRN [id number]  Location: patient's home  Provider location: [address] [place]  Evaluation Performed: Follow-up visit  PCP:  [name], MD  Cardiologist:    Electrophysiologist:  SK   Chief Complaint:  sinus bradycardia   History of Present Illness:    Debra Saunders is a 77 y.o. female who presents via audio/video conferencing for a telehealth visit today.  Since last being seen in our clinic for lowup for Ogema pacemaker implanted 7/15 for bradycardia and chronotropic incompetence with endocarditis identified in setting of sepsis and bacteremia Strep G>>lead vegetation.  Not felt to be candidate for system extraction and so Rx with ABx and lifelong suppression recommended   Hx of Atrial fib on Apixaban cx by iron deficiency anemia with a saturation of 10% 1/23  Also Pulm HTN on O2, sildenafil and macitentan   Chronic dyspnea.    Pulmonary hypertension has been improved with therapy with sildenafil and macitentan (ERAntagonist) she is Oxygen dependent  Hypertension with orthostatic hypotension-- still with problem  saw [facility] neuro notes are confusing  APP said continue midodrine and AVS says stop she ahs more dizziness off of it Uses shower chair  hospital bed, but feet are flat on the floor    Mostly nonambulatory   Continues with suppressive antibiotics   DATE TEST EF    4/19 LHC   No obstruc CAD  3/21 Echo  60-65% Pa PRESS 50   1/23 Echo  60-65% RVSP 51 RV failure  8/23 RHC   Nl filling pressures    Date Cr K Hgb TSH LFTs  2/21 1.16 3.8 8.3       1/23 1.29 3.7  8.6 (iron sat  9%)  0.054 16  8/23   3.3   0.397     12/23 1.01 4.0 9.0            Past Medical History:  Diagnosis Date   Arthritis    Atrial fibrillation, persistent ([place])    a. s/p TEE-guided DCCV on 08/20/2017; b. 06/2021 Recurrent Afib-->amio/TEE/DCCV.   CAD (coronary artery disease)    a. 01/2012 Cath: nonobs dzs; b. 04/2014 MV: No ischemia; c. 09/2017 Cath: Minimal nonobs RCA dzs, otw nl cors.   Cardiorenal syndrome    a. 09/2017 Creat rose to 2.7 w/ diuresis-->HD x 2.   Chronic back pain    Chronic combined systolic (congestive) and diastolic (congestive) heart failure ([place])    a. Dx 2005 @ [facility];  b. 02/2014 Echo: EF 55-60%; c. 07/2017: EF 30-35%; d. 09/2017 Echo: EF 40-45%; e. 02/2018 Echo: EF 55-60%; f. 08/2019 Echo: EF 60-65%; g. 08/2020 RHC: RA 3, RV 30/1, PA 33/11 (20), PCWP 6; f. 08/2021 Echo: EF 60-65%, no rwma, GrIDD, nl RV fxn, RVSP 40.30mHg, mod dil LA, mod TR.   Chronic respiratory failure (HCC)    a. on home O2   Gastritis    History of intracranial hemorrhage    a. 6/14 described by neurosurgery as "small frontal posttraumatic SAH " - pt was on xarelto @ the time.   Hyperlipidemia    Hypertension  Interstitial lung disease (HCC)    Lipoma of back    Lymphedema    a. Chronic LLE edema.   NICM (nonischemic cardiomyopathy) ([place])    a. 09/2017 Echo: EF 40-45%; b. 09/2017 Cath: minor, insignificant CAD; c. 02/2018 Echo: EF 55-60%, Gr1 DD; d. 08/2019 Echo: EF 60-65%, Gr2 DD.   Obesity    PAH (pulmonary artery hypertension) ([place])    a. 09/2017 Echo: PASP 71mHg; b. 09/2017 RHC: RA 23, RV 84/13, PA 84/29, PCWP 20, CO 3.89, CI 1.89. LVEDP 18; c. 08/2020 RHC: RA 3, RV 30/1, PA 33/11 (20), PCWP 6. CO/CI 6.18/3.36. PVR 2.3 WU.   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 06/2021. Veg on PPM lead. Not candidate for extraction-->s/p 6 wks abx.   Sepsis with metabolic encephalopathy ([place]    Sleep apnea    Streptococcal bacteremia    Stroke ([facility] 2014    Subarachnoid hemorrhage ([place] 01/19/2013   Symptomatic bradycardia    a. s/p BSX PPM.   Thyroid nodule    Type II diabetes mellitus ([place]    Urinary incontinence     Past Surgical History:  Procedure Laterality Date   ABDOMINAL HYSTERECTOMY  1969   BREAST BIOPSY Right 10/28/2019   lymph node bx, [place], pending path    [place]IAC CATHE[place]  [place]   [place]: Only 20% ostial left circumflex.   CARDIOVERSION N/A 10/12/2017   Procedure: CARDIOVERSION;  Surgeon: GMinna Merritts MD;  [place];  Service: Cardiovascular;  Laterality: N/A;   CARDIOVERSION N/A 07/04/2021   Procedure: CARDIOVERSION;  Surgeon: MLarey Dresser MD;  [facility];  Service: Cardiovascular;  Laterality: N/A;   CATARACT EXTRACTION W/PHACO Right 09/19/2020   Procedure: CATARACT EXTRACTION PHACO AND INTRAOCULAR LENS PLACEMENT (IOC) RIGHT DIABETIC 3.95 00:43.0 9.2%;  Surgeon: BLeandrew Koyanagi MD;  [place]  Service: Ophthalmology;  Laterality: Right;  Pt requests to be last sleep apnea   CATARACT EXTRACTION W/PHACO Left 10/03/2020   Procedure: CATARACT EXTRACTION PHACO AND INTRAOCULAR LENS PLACEMENT ([place] LEFT DIABETIC;  Surgeon: BLeandrew Koyanagi MD;  [place]  Service: Ophthalmology;  Laterality: Left;  3.25 0:59.3 5.5%   CHOLECYSTECTOMY     COLONOSCOPY WITH PROPOFOL N/A 09/10/2018   Procedure: COLONOSCOPY WITH PROPOFOL;  Surgeon: EManya Silvas MD;  [facility];  Service: Endoscopy;  Laterality: N/A;   CYST REMOVAL TRUNK  2002   BACK   DIALYSIS/PERMA CATHETER INSERTION N/A 10/06/2017   Procedure: DIALYSIS/PERMA CATHETER INSERTION;  Surgeon: SKatha Cabal MD;  [place] [facility];  Service: Cardiovascular;  Laterality: N/A;   INSERT / REPLACE / REMOVE PACEMAKER     lipoma removal  2014   back   PACEMAKER INSERTION  2015   [employer] dual chamber pacemaker implanted by Dr KCaryl Comesfor  symptomatic bradycardia   PERMANENT PACEMAKER INSERTION N/A 01/09/2014   Procedure: PERMANENT PACEMAKER INSERTION;  Surgeon: SDeboraha Sprang MD;  [facility];  Service: Cardiovascular;  Laterality: N/A;   RIGHT HEART CATH N/A 04/19/2018   Procedure: RIGHT HEART CATH;  Surgeon: MLarey Dresser MD;  [place] [facility];  Service: Cardiovascular;  Laterality: N/A;   RIGHT HEART CATH N/A 08/30/2020   Procedure: RIGHT HEART CATH;  Surgeon: MLarey Dresser MD;  [place];  Service: Cardiovascular;  Laterality: N/A;   RIGHT HEART CATH N/A 02/11/2022  Procedure: RIGHT HEART CATH;  Surgeon: Larey Dresser, MD;  Location: [place] CV LAB;  Service: Cardiovascular;  Laterality: N/A;   RIGHT/LEFT HEART CATH AND CORONARY ANGIOGRAPHY N/A 10/19/2017   Procedure: RIGHT/LEFT HEART CATH AND CORONARY ANGIOGRAPHY;  Surgeon: Wellington Hampshire, MD;  Location: [place] CV LAB;  Service: Cardiovascular;  Laterality: N/A;   TEE WITHOUT CARDIOVERSION N/A 08/20/2017   Procedure: TRANSESOPHAGEAL ECHOCARDIOGRAM (TEE) & Direct current cardioversion;  Surgeon: Minna Merritts, MD;  [facility];  Service: Cardiovascular;  Laterality: N/A;   TEE WITHOUT CARDIOVERSION N/A 06/28/2021   Procedure: TRANSESOPHAGEAL ECHOCARDIOGRAM (TEE);  Surgeon: Minna Merritts, MD;  Location: [facility] ORS;  Service: Cardiovascular;  Laterality: N/A;   TEE WITHOUT CARDIOVERSION N/A 07/04/2021   Procedure: TRANSESOPHAGEAL ECHOCARDIOGRAM (TEE);  Surgeon: Larey Dresser, MD;  Location[facility];  Service: Cardiovascular;  Laterality: N/A;   TRIGGER FINGER RELEASE      Current Outpatient Medications  Medication Sig Dispense Refill   acetaminophen (TYLENOL) 325 MG tablet Take 325-650 mg by mouth every 6 (six) hours as needed for mild pain.     albuterol (ACCUNEB) 0.63 MG/3ML nebulizer solution Take 1 ampule by nebulization every 6 (six) hours as needed for wheezing.     cefadroxil (DURICEF)  500 MG capsule Take 1 capsule (500 mg total) by mouth 2 (two) times daily. 180 capsule 3   Difluprednate 0.05 % EMUL Place 1 drop into both eyes 3 (three) times daily.     ELIQUIS 5 MG TABS tablet Take 1 tablet by mouth twice daily 180 tablet 0   gabapentin (NEURONTIN) 300 MG capsule Take 2 capsules (600 mg total) by mouth 3 (three) times daily. (Patient taking differently: Take 300 mg by mouth 2 (two) times daily. once a day) 540 capsule 1   macitentan (OPSUMIT) 10 MG tablet Take 1 tablet (10 mg total) by mouth daily. 90 tablet 3   meclizine (ANTIVERT) 25 MG tablet Take 1 tablet (25 mg total) by mouth 3 (three) times daily as needed for dizziness. 30 tablet 1   Menthol, Topical Analgesic, (BIOFREEZE EX) Apply 1 application topically 4 (four) times daily as needed (for pain).     potassium chloride SA (KLOR-CON M) 20 MEQ tablet Take 2 tablets (40 mEq total) by mouth daily. 60 tablet 2   rosuvastatin (CRESTOR) 10 MG tablet Take 1 tablet (10 mg total) by mouth daily. 90 tablet 3   Selexipag (UPTRAVI) 1600 MCG TABS Take 1 tablet (1,600 mcg total) by mouth 2 (two) times daily. 60 tablet 11   sildenafil (REVATIO) 20 MG tablet Take 1 tablet (20 mg total) by mouth 3 (three) times daily. 90 tablet 3   doxycycline (ADOXA) 100 MG tablet Take 1 tablet (100 mg total) by mouth 2 (two) times daily. 12 tablet 0   metolazone (ZAROXOLYN) 2.5 MG tablet Take 2.5 mg by mouth every Monday, Wednesday, and Friday.     midodrine (PROAMATINE) 5 MG tablet Take 5 mg by mouth 3 (three) times daily. Taking once a day, not laying down for 4 hrs after (Patient not taking: Reported on 07/15/2022)     spironolactone (ALDACTONE) 25 MG tablet Take 0.5 tablet (12.5 mg) by mouth once daily (Patient taking differently: Take 25 mg by mouth daily. She has been taking a whole pill)     torsemide (DEMADEX) 20 MG tablet Take 3 tablets (60 mg total) by mouth 2 (two) times daily. Patient takes 3 tablets by mouth twice a day. (Patient taking  differently: Take 40 mg by mouth 2 (two) times daily. Been taking '40mg'$  twice a day) 180 tablet 6   No current facility-administered medications for this visit.    Allergies:   Aspirin and Other      ROS:  Please see the history of present illness.   All other systems are personally reviewed and negative.    Exam:    Vital Signs:  Ht 5' (1.524 m)   BMI 37.50 kg/m         Data Reviewed:      Other studies personally reviewed: Additional studies/ records that were reviewed today include: (As above)   Review of the above records today demonstrates: (As above)     Last device remote is reviewed from [place] PDF dated 1/24 which reveals normal device function,   arrhythmias - rare afib Longevity about 5 years    ASSESSMENT & PLAN:    Chronotropic incompetence   Sinus bradycardia   Atrial fibrillation-persistent requiring cardioversion   Atrial tachycardia non sustained with rates 200 msec (ie nonsustained AFib)   Dyspnea on exertion   Cardiac implantable device infection-Streptococcus-chronic   Pacemaker [employer]      HFpEF   Hypertension and orthostatic hypotension   Anemia-iron deficiency   Hyperthyroidism   High risk medication surveillance-amiodarone   Discussed orthostasis  1) resume proamatine '5mg'$  12, 16 and 2000 hrs ( goes to bed at 3 am) 2) raise HOB on block 4 " 3) try decreasing fluids and using hard candy to assuage thirst 4) need to work on anemia     Follow-up:  93m Next remote: As Scheduled   Current medicines are reviewed at length with the patient today.   The patient does not have concerns regarding her medicines.  The following changes were made today:  (As above)   Labs/ tests ordered today include: none No orders of the defined types were placed in this encounter.   Future tests       Today, I have spent 27 minutes with the patient with telehealth technology discussing the above.  Signed, SVirl Axe MD   07/15/2022 3:26 PM     [id number] [phone](office) ([phone](fax)

## 2022-07-16 ENCOUNTER — Other Ambulatory Visit (HOSPITAL_COMMUNITY)
Admission: RE | Admit: 2022-07-16 | Discharge: 2022-07-16 | Disposition: A | Discharge status: Home / Self Care | Payer: Medicare HMO | Source: Ambulatory Visit | Attending: Obstetrics and Gynecology | Admitting: Obstetrics and Gynecology

## 2022-07-16 ENCOUNTER — Encounter: Payer: Self-pay | Admitting: Obstetrics and Gynecology

## 2022-07-16 ENCOUNTER — Ambulatory Visit: Payer: Medicare HMO | Admitting: Obstetrics and Gynecology

## 2022-07-16 VITALS — BP 109/58 | HR 61 | Ht 60.0 in | Wt 198.0 lb

## 2022-07-16 DIAGNOSIS — N762 Acute vulvitis: Secondary | ICD-10-CM | POA: Diagnosis not present

## 2022-07-16 DIAGNOSIS — N9089 Other specified noninflammatory disorders of vulva and perineum: Secondary | ICD-10-CM | POA: Insufficient documentation

## 2022-07-16 NOTE — Progress Notes (Signed)
HPI:      [name] is a 77 y.o. G4P0 who LMP was No LMP recorded. Patient has had a hysterectomy.  Subjective:   She presents today to follow-up on her vulvar biopsy.  She has multiple lesions on her vulva which appeared condylomatous however at last biopsy showed only normal tissue.  I do not believe this to be the case so a repeat biopsy will be performed today.    Hx: The following portions of the patient's history were reviewed and updated as appropriate:             She  has a past medical history of Arthritis, Atrial fibrillation, persistent ([place]), CAD (coronary artery disease), Cardiorenal syndrome, Chronic back pain, Chronic combined systolic (congestive) and diastolic (congestive) heart failure ([place]), Chronic respiratory failure ([place]), Gastritis, History of intracranial hemorrhage, Hyperlipidemia, Hypertension, Interstitial lung disease ([place]), Lipoma of back, Lymphedema, NICM (nonischemic cardiomyopathy) ([place]), Obesity, PAH (pulmonary artery hypertension) ([place]), Presence of permanent cardiac pacemaker (01/09/2014), SBE (subacute bacterial endocarditis), Sepsis with metabolic encephalopathy ([place]), Sleep apnea, Streptococcal bacteremia, Stroke ([place]) (2014), Subarachnoid hemorrhage ([place]) (01/19/2013), Symptomatic bradycardia, Thyroid nodule, Type II diabetes mellitus ([place]), and Urinary incontinence. She does not have any pertinent problems on file. She  has a past surgical history that includes Cholecystectomy; Cardiac catheterization (2013); Abdominal hysterectomy (1969); Cyst removal trunk (2002); lipoma removal (2014); Pacemaker insertion (2015); Insert / replace / remove pacemaker; permanent pacemaker insertion (N/A, 01/09/2014); Trigger finger release; TEE without cardioversion (N/A, 08/20/2017); DIALYSIS/PERMA CATHETER INSERTION (N/A, 10/06/2017); CARDIOVERSION (N/A, 10/12/2017); RIGHT/LEFT HEART CATH AND CORONARY ANGIOGRAPHY (N/A, 10/19/2017); RIGHT HEART CATH (N/A, 04/19/2018);  Colonoscopy with propofol (N/A, 09/10/2018); Breast biopsy (Right, 10/28/2019); RIGHT HEART CATH (N/A, 08/30/2020); Cataract extraction w/PHACO (Right, 09/19/2020); Cataract extraction w/PHACO (Left, 10/03/2020); TEE without cardioversion (N/A, 06/28/2021); TEE without cardioversion (N/A, 07/04/2021); Cardioversion (N/A, 07/04/2021); and RIGHT HEART CATH (N/A, 02/11/2022). Her family history includes Bone cancer in her sister; Breast cancer in her maternal aunt and sister; COPD in her mother; Heart disease in her mother; Hypertension in her father, mother, sister, and sister; Stroke in her son; Thyroid disease in her mother, sister, and sister. She  reports that she quit smoking about 32 years ago. Her smoking use included cigarettes. She has a 0.60 pack-year smoking history. She has never used smokeless tobacco. She reports that she does not drink alcohol and does not use drugs. She has a current medication list which includes the following prescription(s): acetaminophen, albuterol, cefadroxil, difluprednate, doxycycline, eliquis, gabapentin, macitentan, meclizine, menthol (topical analgesic), metolazone, midodrine, potassium chloride sa, rosuvastatin, uptravi, sildenafil, spironolactone, and torsemide. She is allergic to aspirin and other.       Review of Systems:  Review of Systems  Constitutional: Denied constitutional symptoms, night sweats, recent illness, fatigue, fever, insomnia and weight loss.  Eyes: Denied eye symptoms, eye pain, photophobia, vision change and visual disturbance.  Ears/Nose/Throat/Neck: Denied ear, nose, throat or neck symptoms, hearing loss, nasal discharge, sinus congestion and sore throat.  Cardiovascular: Denied cardiovascular symptoms, arrhythmia, chest pain/pressure, edema, exercise intolerance, orthopnea and palpitations.  Respiratory: Denied pulmonary symptoms, asthma, pleuritic pain, productive sputum, cough, dyspnea and wheezing.  Gastrointestinal: Denied, gastro-esophageal  reflux, melena, nausea and vomiting.  Genitourinary: See HPI for additional information.  Musculoskeletal: Denied musculoskeletal symptoms, stiffness, swelling, muscle weakness and myalgia.  Dermatologic: Denied dermatology symptoms, rash and scar.  Neurologic: Denied neurology symptoms, dizziness, headache, neck pain and syncope.  Psychiatric: Denied psychiatric symptoms, anxiety and depression.  Endocrine: Denied endocrine symptoms including  hot flashes and night sweats.   Meds:   Current Outpatient Medications on File Prior to Visit  Medication Sig Dispense Refill   acetaminophen (TYLENOL) 325 MG tablet Take 325-650 mg by mouth every 6 (six) hours as needed for mild pain.     albuterol (ACCUNEB) 0.63 MG/3ML nebulizer solution Take 1 ampule by nebulization every 6 (six) hours as needed for wheezing.     cefadroxil (DURICEF) 500 MG capsule Take 1 capsule (500 mg total) by mouth 2 (two) times daily. 180 capsule 3   Difluprednate 0.05 % EMUL Place 1 drop into both eyes 3 (three) times daily.     doxycycline (ADOXA) 100 MG tablet Take 1 tablet (100 mg total) by mouth 2 (two) times daily. 12 tablet 0   ELIQUIS 5 MG TABS tablet Take 1 tablet by mouth twice daily 180 tablet 0   gabapentin (NEURONTIN) 300 MG capsule Take 2 capsules (600 mg total) by mouth 3 (three) times daily. (Patient taking differently: Take 300 mg by mouth 2 (two) times daily. once a day) 540 capsule 1   macitentan (OPSUMIT) 10 MG tablet Take 1 tablet (10 mg total) by mouth daily. 90 tablet 3   meclizine (ANTIVERT) 25 MG tablet Take 1 tablet (25 mg total) by mouth 3 (three) times daily as needed for dizziness. 30 tablet 1   Menthol, Topical Analgesic, (BIOFREEZE EX) Apply 1 application topically 4 (four) times daily as needed (for pain).     metolazone (ZAROXOLYN) 2.5 MG tablet Take 2.5 mg by mouth every Monday, Wednesday, and Friday.     midodrine (PROAMATINE) 5 MG tablet Take one tablet at 12 noon, 4pm and 8pm 270 tablet 3    potassium chloride SA (KLOR-CON M) 20 MEQ tablet Take 2 tablets (40 mEq total) by mouth daily. 60 tablet 2   rosuvastatin (CRESTOR) 10 MG tablet Take 1 tablet (10 mg total) by mouth daily. 90 tablet 3   Selexipag (UPTRAVI) 1600 MCG TABS Take 1 tablet (1,600 mcg total) by mouth 2 (two) times daily. 60 tablet 11   sildenafil (REVATIO) 20 MG tablet Take 1 tablet (20 mg total) by mouth 3 (three) times daily. 90 tablet 3   spironolactone (ALDACTONE) 25 MG tablet Take 0.5 tablet (12.5 mg) by mouth once daily (Patient taking differently: Take 25 mg by mouth daily. She has been taking a whole pill)     torsemide (DEMADEX) 20 MG tablet Take 3 tablets (60 mg total) by mouth 2 (two) times daily. Patient takes 3 tablets by mouth twice a day. (Patient taking differently: Take 40 mg by mouth 2 (two) times daily. Been taking '40mg'$  twice a day) 180 tablet 6   No current facility-administered medications on file prior to visit.      Objective:     Vitals:   07/16/22 1528  BP: (!) 109/58  Pulse: 61   Filed Weights   07/16/22 1528  Weight: 198 lb (89.8 kg)              Physical examination   Pelvic:   Vulva: Normal appearance.    Multiple condylomatous appearing lesions close to the clitoral hood and anterior labia somewhat vesicular  Urethra No masses tenderness or scarring.  Meatus Normal size without lesions or prolapse.  Anus: Normal exam.  No lesions.  Perineum: Normal exam.  No lesions.           Consent given by patient for labial biopsy  Cleansed with Betadine, injected with 1% lidocaine with  epi, confirmed to be numb, cervical biopsy forceps used to excise 2 pieces of tissue in the area of concern.  Silver nitrate applied hemostasis noted.  Patient tolerated procedure well.  Assessment:    G4P0 Patient Active Problem List   Diagnosis Date Noted   Head injury 08/30/2021   Stage 3b chronic kidney disease (CKD) ([place]) 08/23/2021   Anemia due to chronic kidney disease 08/23/2021    History of endocarditis 08/22/2021   Orthostatic hypotension 08/22/2021   Generalized weakness 08/21/2021   History of COVID-19    Infection due to implanted cardiac device ([place]) 07/25/2021   Abdominal pain 07/06/2021   Pressure injury of skin 07/01/2021   GI bleeding 06/25/2021   Black stool 06/25/2021   Severe sepsis with septic shock secondary to infected pacemaker with streptoccos bacteremia  06/25/2021   HTN (hypertension)    HLD (hyperlipidemia)    Chronic diastolic CHF (congestive heart failure) ([place])    Acute renal failure superimposed on stage 3a chronic kidney disease (HCC)    DVT (deep venous thrombosis) (HCC)    Hyponatremia    Thrombocytopenia ([place])    Diarrhea    Burn 04/30/2020   Obesity (BMI 30-39.9) 04/30/2020   Elevated alkaline phosphatase level 04/30/2020   Chronic diarrhea 11/04/2019   Dysuria 09/29/2019   Abnormal mammogram 09/29/2019   Breast pain, right 08/08/2019   Bilateral ovarian cysts 08/08/2019   B12 deficiency 08/02/2019   Iron deficiency 08/01/2019   Pansinusitis 07/06/2019   Shortness of breath    Atrial fibrillation with tachy brady syndrome  09/22/2017   Acute bilateral low back pain without sciatica 01/28/2017   Pain in limb 07/22/2016   Neuropathy 06/09/2016   Chronic deep vein thrombosis (DVT) ([place]) 03/03/2016   Chronic back pain 10/19/2015   Goiter 04/25/2015   Cervical disc disorder with radiculopathy of cervical region 03/30/2015   Severe dizziness 09/01/2014   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   Interstitial lung disease ([place]) 04/06/2014   Abnormality of gait 01/13/2014   Sinoatrial node dysfunction ([place]) 01/09/2014   Allergic rhinitis 10/26/2013   Lymphedema 08/03/2013   Constipation 04/12/2013   Costochondritis 01/19/2013   Fatigue 09/27/2012   Normocytic anemia 04/28/2012   OSA (obstructive sleep apnea) 04/22/2012   Pulmonary hypertension, unspecified ([place]) 03/30/2012   Hyperlipidemia 03/30/2012   Hypertension       1. Lesion of labia        Plan:            1.  Will contact patient with results return.  We will discuss diagnosis.  Video visit possible. Orders No orders of the defined types were placed in this encounter.   No orders of the defined types were placed in this encounter.     F/U  Return in about 2 weeks (around 07/30/2022). I spent 21 minutes involved in the care of this patient preparing to see the patient by obtaining and reviewing her medical history (including labs, imaging tests and prior procedures), documenting clinical information in the electronic health record (EHR), counseling and coordinating care plans, writing and sending prescriptions, ordering tests or procedures and in direct communicating with the patient and medical staff discussing pertinent items from her history and physical exam.  Finis Bud, M.D. 07/16/2022 3:53 PM

## 2022-07-16 NOTE — Progress Notes (Signed)
Patient presents today for a labial biopsy. She states no additional concerns.

## 2022-07-17 ENCOUNTER — Encounter: Payer: Self-pay | Admitting: Obstetrics and Gynecology

## 2022-07-17 ENCOUNTER — Encounter (HOSPITAL_COMMUNITY): Payer: Self-pay

## 2022-07-17 ENCOUNTER — Other Ambulatory Visit (HOSPITAL_COMMUNITY): Payer: Self-pay

## 2022-07-17 NOTE — Progress Notes (Signed)
Today had a home visit with [name].  She states not feeling well today.  She states since her appt yesterday she has had body aches and diarrhea.  She complains of headache and dizziness, she states no more than normal for her sometimes.  She has ate and drank today.  She has took her morning meds.  She has all her meds except on eliquis she has enough for 2 days.  She states will pick them up.  She states has not started the midodrine back yet, she states will start today.  Lungs are clear, no fever.  Edema in left leg and foot, right is good.  She is in good spirits except for feeling bad.  She has family support.  She is aware of what is going on and mentioned Caryl Comes was giving her a palliative order for the home.  She is aware of up coming appts.  She denies any problems such as chest pain or increased shortness of breath.  She states short on money but know she has to get her medications.  She has everything for daily living.  Will continue to visit for heart failure, diet and medication management.   [place] [phone]

## 2022-07-18 LAB — SURGICAL PATHOLOGY

## 2022-07-24 NOTE — Progress Notes (Signed)
Schedule a video visit with me if possible

## 2022-07-30 ENCOUNTER — Encounter: Payer: Self-pay | Admitting: Obstetrics and Gynecology

## 2022-07-30 ENCOUNTER — Ambulatory Visit: Payer: Medicare HMO | Admitting: Obstetrics and Gynecology

## 2022-07-30 VITALS — BP 91/50 | HR 60 | Ht 60.0 in | Wt 203.9 lb

## 2022-07-30 DIAGNOSIS — D280 Benign neoplasm of vulva: Secondary | ICD-10-CM

## 2022-07-30 NOTE — Progress Notes (Signed)
Patient presents today to discuss recent biopsy results. She states occasional spotting after bathing but no other concerns.

## 2022-07-30 NOTE — Progress Notes (Signed)
HPI:      Debra Saunders is a 77 y.o. G4P0 who LMP was No LMP recorded. Patient has had a hysterectomy.  Subjective:   She presents today for follow-up of her vulvar biopsy and results.  She has several areas of growth on her labia and mons.  A biopsy was performed.    Hx: The following portions of the patient's history were reviewed and updated as appropriate:             She  has a past medical history of Arthritis, Atrial fibrillation, persistent ([place]), CAD (coronary artery disease), Cardiorenal syndrome, Chronic back pain, Chronic combined systolic (congestive) and diastolic (congestive) heart failure ([place]), Chronic respiratory failure ([place]), Gastritis, History of intracranial hemorrhage, Hyperlipidemia, Hypertension, Interstitial lung disease ([place]), Lipoma of back, Lymphedema, NICM (nonischemic cardiomyopathy) ([place]), Obesity, PAH (pulmonary artery hypertension) ([place]), Presence of permanent cardiac pacemaker (01/09/2014), SBE (subacute bacterial endocarditis), Sepsis with metabolic encephalopathy ([place]), Sleep apnea, Streptococcal bacteremia, Stroke ([place]) (2014), Subarachnoid hemorrhage ([place]) (01/19/2013), Symptomatic bradycardia, Thyroid nodule, Type II diabetes mellitus ([place]), and Urinary incontinence. She does not have any pertinent problems on file. She  has a past surgical history that includes Cholecystectomy; Cardiac catheterization (2013); Abdominal hysterectomy (1969); Cyst removal trunk (2002); lipoma removal (2014); Pacemaker insertion (2015); Insert / replace / remove pacemaker; permanent pacemaker insertion (N/A, 01/09/2014); Trigger finger release; TEE without cardioversion (N/A, 08/20/2017); DIALYSIS/PERMA CATHETER INSERTION (N/A, 10/06/2017); CARDIOVERSION (N/A, 10/12/2017); RIGHT/LEFT HEART CATH AND CORONARY ANGIOGRAPHY (N/A, 10/19/2017); RIGHT HEART CATH (N/A, 04/19/2018); Colonoscopy with propofol (N/A, 09/10/2018); Breast biopsy (Right, 10/28/2019); RIGHT HEART CATH (N/A,  08/30/2020); Cataract extraction w/PHACO (Right, 09/19/2020); Cataract extraction w/PHACO (Left, 10/03/2020); TEE without cardioversion (N/A, 06/28/2021); TEE without cardioversion (N/A, 07/04/2021); Cardioversion (N/A, 07/04/2021); and RIGHT HEART CATH (N/A, 02/11/2022). Her family history includes Bone cancer in her sister; Breast cancer in her maternal aunt and sister; COPD in her mother; Heart disease in her mother; Hypertension in her father, mother, sister, and sister; Stroke in her son; Thyroid disease in her mother, sister, and sister. She  reports that she quit smoking about 32 years ago. Her smoking use included cigarettes. She has a 0.60 pack-year smoking history. She has never used smokeless tobacco. She reports that she does not drink alcohol and does not use drugs. She has a current medication list which includes the following prescription(s): acetaminophen, albuterol, cefadroxil, difluprednate, doxycycline, duloxetine, eliquis, gabapentin, macitentan, meclizine, menthol (topical analgesic), metolazone, midodrine, potassium chloride sa, rosuvastatin, uptravi, sildenafil, spironolactone, and torsemide. She is allergic to aspirin and other.       Review of Systems:  Review of Systems  Constitutional: Denied constitutional symptoms, night sweats, recent illness, fatigue, fever, insomnia and weight loss.  Eyes: Denied eye symptoms, eye pain, photophobia, vision change and visual disturbance.  Ears/Nose/Throat/Neck: Denied ear, nose, throat or neck symptoms, hearing loss, nasal discharge, sinus congestion and sore throat.  Cardiovascular: Denied cardiovascular symptoms, arrhythmia, chest pain/pressure, edema, exercise intolerance, orthopnea and palpitations.  Respiratory: Denied pulmonary symptoms, asthma, pleuritic pain, productive sputum, cough, dyspnea and wheezing.  Gastrointestinal: Denied, gastro-esophageal reflux, melena, nausea and vomiting.  Genitourinary: See HPI for additional information.   Musculoskeletal: Denied musculoskeletal symptoms, stiffness, swelling, muscle weakness and myalgia.  Dermatologic: Denied dermatology symptoms, rash and scar.  Neurologic: Denied neurology symptoms, dizziness, headache, neck pain and syncope.  Psychiatric: Denied psychiatric symptoms, anxiety and depression.  Endocrine: Denied endocrine symptoms including hot flashes and night sweats.   Meds:   Current Outpatient Medications on File Prior to  Visit  Medication Sig Dispense Refill   acetaminophen (TYLENOL) 325 MG tablet Take 325-650 mg by mouth every 6 (six) hours as needed for mild pain.     albuterol (ACCUNEB) 0.63 MG/3ML nebulizer solution Take 1 ampule by nebulization every 6 (six) hours as needed for wheezing.     cefadroxil (DURICEF) 500 MG capsule Take 1 capsule (500 mg total) by mouth 2 (two) times daily. 180 capsule 3   Difluprednate 0.05 % EMUL Place 1 drop into both eyes 3 (three) times daily.     doxycycline (ADOXA) 100 MG tablet Take 1 tablet (100 mg total) by mouth 2 (two) times daily. 12 tablet 0   DULoxetine (CYMBALTA) 60 MG capsule Take 60 mg by mouth daily.     ELIQUIS 5 MG TABS tablet Take 1 tablet by mouth twice daily 180 tablet 0   gabapentin (NEURONTIN) 300 MG capsule Take 2 capsules (600 mg total) by mouth 3 (three) times daily. (Patient taking differently: Take 300 mg by mouth 2 (two) times daily. once a day) 540 capsule 1   macitentan (OPSUMIT) 10 MG tablet Take 1 tablet (10 mg total) by mouth daily. 90 tablet 3   meclizine (ANTIVERT) 25 MG tablet Take 1 tablet (25 mg total) by mouth 3 (three) times daily as needed for dizziness. 30 tablet 1   Menthol, Topical Analgesic, (BIOFREEZE EX) Apply 1 application topically 4 (four) times daily as needed (for pain).     metolazone (ZAROXOLYN) 2.5 MG tablet Take 2.5 mg by mouth every Monday, Wednesday, and Friday.     midodrine (PROAMATINE) 5 MG tablet Take one tablet at 12 noon, 4pm and 8pm 270 tablet 3   potassium chloride SA  (KLOR-CON M) 20 MEQ tablet Take 2 tablets (40 mEq total) by mouth daily. (Patient taking differently: Take 20 mEq by mouth daily. Advised Dr Melrose Nakayama advised to take 1 a day) 60 tablet 2   rosuvastatin (CRESTOR) 10 MG tablet Take 1 tablet (10 mg total) by mouth daily. 90 tablet 3   Selexipag (UPTRAVI) 1600 MCG TABS Take 1 tablet (1,600 mcg total) by mouth 2 (two) times daily. 60 tablet 11   sildenafil (REVATIO) 20 MG tablet Take 1 tablet (20 mg total) by mouth 3 (three) times daily. 90 tablet 3   spironolactone (ALDACTONE) 25 MG tablet Take 0.5 tablet (12.5 mg) by mouth once daily (Patient taking differently: Take 25 mg by mouth daily. She has been taking a whole pill)     torsemide (DEMADEX) 20 MG tablet Take 3 tablets (60 mg total) by mouth 2 (two) times daily. Patient takes 3 tablets by mouth twice a day. (Patient taking differently: Take 40 mg by mouth 2 (two) times daily. Been taking '40mg'$  twice a day) 180 tablet 6   No current facility-administered medications on file prior to visit.      Objective:     Vitals:   07/30/22 1509  BP: (!) 91/50  Pulse: 60   Filed Weights   07/30/22 1509  Weight: 203 lb 14.4 oz (92.5 kg)              Biopsy results show angiokeratoma benign          Assessment:    G4P0 Patient Active Problem List   Diagnosis Date Noted   Head injury 08/30/2021   Stage 3b chronic kidney disease (CKD) ([place]) 08/23/2021   Anemia due to chronic kidney disease 08/23/2021   History of endocarditis 08/22/2021   Orthostatic hypotension 08/22/2021  Generalized weakness 08/21/2021   History of COVID-19    Infection due to implanted cardiac device ([place]) 07/25/2021   Abdominal pain 07/06/2021   Pressure injury of skin 07/01/2021   GI bleeding 06/25/2021   Black stool 06/25/2021   Severe sepsis with septic shock secondary to infected pacemaker with streptoccos bacteremia  06/25/2021   HTN (hypertension)    HLD (hyperlipidemia)    Chronic diastolic CHF (congestive  heart failure) ([place])    Acute renal failure superimposed on stage 3a chronic kidney disease (H[facility])    DVT (deep venous thrombosis) (H[facility])    Hyponatremia    Thrombocytopenia ([place])    Diarrhea    Burn 04/30/2020   Obesity (BMI 30-39.9) 04/30/2020   Elevated alkaline phosphatase level 04/30/2020   Chronic diarrhea 11/04/2019   Dysuria 09/29/2019   Abnormal mammogram 09/29/2019   Breast pain, right 08/08/2019   Bilateral ovarian cysts 08/08/2019   B12 deficiency 08/02/2019   Iron deficiency 08/01/2019   Pansinusitis 07/06/2019   Shortness of breath    Atrial fibrillation with tachy brady syndrome  09/22/2017   Acute bilateral low back pain without sciatica 01/28/2017   Pain in limb 07/22/2016   Neuropathy 06/09/2016   Chronic deep vein thrombosis (DVT) ([place]) 03/03/2016   Chronic back pain 10/19/2015   Goiter 04/25/2015   Cervical disc disorder with radiculopathy of cervical region 03/30/2015   Severe dizziness 09/01/2014   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   Interstitial lung disease ([place]) 04/06/2014   Abnormality of gait 01/13/2014   Sinoatrial node dysfunction ([place]) 01/09/2014   Allergic rhinitis 10/26/2013   Lymphedema 08/03/2013   Constipation 04/12/2013   Costochondritis 01/19/2013   Fatigue 09/27/2012   Normocytic anemia 04/28/2012   OSA (obstructive sleep apnea) 04/22/2012   Pulmonary hypertension, unspecified ([place]) 03/30/2012   Hyperlipidemia 03/30/2012   Hypertension      1. Angiokeratoma of labia majora     We discussed this diagnosis in detail.  Patient says they occasionally irritate her but otherwise she is fine.   Plan:            1.  Have discussed possible referral to dermatology should patient desire cryotherapy or excision.  She will think about this and schedule her own appointment once she has decided. Happy to learn of the diagnosis-benign. Orders No orders of the defined types were placed in this encounter.   No orders of the defined  types were placed in this encounter.     F/U  No follow-ups on file. I spent 15 minutes involved in the care of this patient preparing to see the patient by obtaining and reviewing her medical history (including labs, imaging tests and prior procedures), documenting clinical information in the electronic health record (EHR), counseling and coordinating care plans, writing and sending prescriptions, ordering tests or procedures and in direct communicating with the patient and medical staff discussing pertinent items from her history and physical exam.  Finis Bud, M.D. 07/30/2022 3:28 PM

## 2022-08-05 ENCOUNTER — Ambulatory Visit: Payer: Medicare HMO | Admitting: Internal Medicine

## 2022-08-05 ENCOUNTER — Ambulatory Visit: Payer: Medicare HMO

## 2022-08-05 ENCOUNTER — Other Ambulatory Visit: Payer: Medicare HMO

## 2022-08-19 ENCOUNTER — Inpatient Hospital Stay: Payer: Medicare HMO | Attending: Internal Medicine

## 2022-08-19 ENCOUNTER — Inpatient Hospital Stay: Payer: Medicare HMO

## 2022-08-19 ENCOUNTER — Inpatient Hospital Stay (HOSPITAL_BASED_OUTPATIENT_CLINIC_OR_DEPARTMENT_OTHER): Payer: Medicare HMO | Admitting: Internal Medicine

## 2022-08-19 VITALS — BP 106/66 | HR 61 | Temp 96.6°F | Resp 18 | Wt 197.9 lb

## 2022-08-19 VITALS — BP 122/53 | HR 60 | Temp 97.0°F | Resp 18

## 2022-08-19 DIAGNOSIS — D631 Anemia in chronic kidney disease: Secondary | ICD-10-CM | POA: Insufficient documentation

## 2022-08-19 DIAGNOSIS — Z803 Family history of malignant neoplasm of breast: Secondary | ICD-10-CM | POA: Diagnosis not present

## 2022-08-19 DIAGNOSIS — Z86718 Personal history of other venous thrombosis and embolism: Secondary | ICD-10-CM | POA: Diagnosis not present

## 2022-08-19 DIAGNOSIS — N183 Chronic kidney disease, stage 3 unspecified: Secondary | ICD-10-CM | POA: Insufficient documentation

## 2022-08-19 DIAGNOSIS — I4891 Unspecified atrial fibrillation: Secondary | ICD-10-CM | POA: Diagnosis not present

## 2022-08-19 DIAGNOSIS — Z7901 Long term (current) use of anticoagulants: Secondary | ICD-10-CM | POA: Diagnosis not present

## 2022-08-19 DIAGNOSIS — R002 Palpitations: Secondary | ICD-10-CM | POA: Insufficient documentation

## 2022-08-19 DIAGNOSIS — E538 Deficiency of other specified B group vitamins: Secondary | ICD-10-CM | POA: Diagnosis not present

## 2022-08-19 DIAGNOSIS — R5383 Other fatigue: Secondary | ICD-10-CM | POA: Insufficient documentation

## 2022-08-19 DIAGNOSIS — Z87891 Personal history of nicotine dependence: Secondary | ICD-10-CM | POA: Diagnosis not present

## 2022-08-19 DIAGNOSIS — E611 Iron deficiency: Secondary | ICD-10-CM

## 2022-08-19 DIAGNOSIS — R0602 Shortness of breath: Secondary | ICD-10-CM | POA: Insufficient documentation

## 2022-08-19 DIAGNOSIS — E876 Hypokalemia: Secondary | ICD-10-CM | POA: Insufficient documentation

## 2022-08-19 LAB — BASIC METABOLIC PANEL
Anion gap: 7 (ref 5–15)
BUN: 25 mg/dL — ABNORMAL HIGH (ref 8–23)
CO2: 26 mmol/L (ref 22–32)
Calcium: 8.6 mg/dL — ABNORMAL LOW (ref 8.9–10.3)
Chloride: 104 mmol/L (ref 98–111)
Creatinine, Ser: 1.15 mg/dL — ABNORMAL HIGH (ref 0.44–1.00)
GFR, Estimated: 48 mL/min — ABNORMAL LOW (ref >60)
Glucose, Bld: 118 mg/dL — ABNORMAL HIGH (ref 70–99)
Potassium: 3.4 mmol/L — ABNORMAL LOW (ref 3.5–5.1)
Sodium: 137 mmol/L (ref 135–145)

## 2022-08-19 LAB — CBC WITH DIFFERENTIAL/PLATELET
Abs Immature Granulocytes: 0.01 10*3/uL (ref 0.00–0.07)
Basophils Absolute: 0 10*3/uL (ref 0.0–0.1)
Basophils Relative: 1 %
Eosinophils Absolute: 0.2 10*3/uL (ref 0.0–0.5)
Eosinophils Relative: 4 %
HCT: 33.3 % — ABNORMAL LOW (ref 36.0–46.0)
Hemoglobin: 9.9 g/dL — ABNORMAL LOW (ref 12.0–15.0)
Immature Granulocytes: 0 %
Lymphocytes Relative: 24 %
Lymphs Abs: 1.1 10*3/uL (ref 0.7–4.0)
MCH: 27.3 pg (ref 26.0–34.0)
MCHC: 29.7 g/dL — ABNORMAL LOW (ref 30.0–36.0)
MCV: 91.7 fL (ref 80.0–100.0)
Monocytes Absolute: 0.4 10*3/uL (ref 0.1–1.0)
Monocytes Relative: 7 %
Neutro Abs: 3.1 10*3/uL (ref 1.7–7.7)
Neutrophils Relative %: 64 %
Platelets: 184 10*3/uL (ref 150–400)
RBC: 3.63 MIL/uL — ABNORMAL LOW (ref 3.87–5.11)
RDW: 12.4 % (ref 11.5–15.5)
WBC: 4.9 10*3/uL (ref 4.0–10.5)
nRBC: 0 % (ref 0.0–0.2)

## 2022-08-19 MED ORDER — CYANOCOBALAMIN 1000 MCG/ML IJ SOLN
1000.0000 ug | Freq: Once | INTRAMUSCULAR | Status: AC
  Administered 2022-08-19: 1000 ug via INTRAMUSCULAR
  Filled 2022-08-19: qty 1

## 2022-08-19 MED ORDER — SODIUM CHLORIDE 0.9 % IV SOLN
200.0000 mg | Freq: Once | INTRAVENOUS | Status: AC
  Administered 2022-08-19: 200 mg via INTRAVENOUS
  Filled 2022-08-19: qty 200

## 2022-08-19 MED ORDER — SODIUM CHLORIDE 0.9 % IV SOLN
Freq: Once | INTRAVENOUS | Status: AC
  Filled 2022-08-19: qty 250

## 2022-08-19 NOTE — Progress Notes (Signed)
Worsening fatigue.

## 2022-08-19 NOTE — Assessment & Plan Note (Signed)
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 CKD stage III  currently s/p Venofer. JAN 2023 [sat 9%; ferrtin-700+- hospital/sepsis];   # [DEC 2023- ferritin 343; iron sat 18%]; continue gentle iron 1 pill a day.    Proceed with venofer today. Also discussed the possible role of Retacrit with patient's hemoglobin does not improve.   # Mild Hypokalemia- K 3.4- continue Kdur BID.   # Etiology of anemia-likely CKD stage III [Dr.Lateef]/ [chronic diarrhea/malabsorption]-monitor closely. Stable.   #History of DVT/s/p IVC filter; ?  A. Fib-Eliquis-stable.   #Borderline low B12-OCT 2023- 230; s/p B12 injection.; proceed with injection q 2- 4 months.    # Vaginal itching/discharge-rs/p Bx- negative.   # DISPOSITION: # proceed  b12 today; and venofer today; no retacrit # Follow up in 3 months-MD; labs-- cbc;bmp; b12 levels; iron studies; ferritin--possible Venofer OR retacrit;  B12 injection- Dr.B  Cc; Tammi Klippel

## 2022-08-19 NOTE — Progress Notes (Signed)
[place] NOTE  Patient Care Team: [name], MD as PCP - General (Family Medicine) Wellington Hampshire, MD as PCP - Cardiology (Cardiology) Deboraha Sprang, MD as PCP - Electrophysiology (Cardiology) Thom Chimes, MD as Referring Physician (Allergy) Juanito Doom, MD as Referring Physician (Internal Medicine) Cammie Sickle, MD as Consulting Physician (Hematology and Oncology) Anemia CHIEF COMPLAINTS/PURPOSE OF CONSULTATION: Anemia   HEMATOLOGY HISTORY  # CHORNIC IRON DEFICIENCY ANEMIA [BL 10-11]-Jan today 21 PCP Hb-8.8; Antonieta Pert sat-5%; ferritin 11] EGD-; colonoscopy MARCH 2020 -polyp [[facility]-hold further work-up secondary to cardiac issues]; remote-EGD-?;  February 2021-LDH/haptoglobin normal; kappa lambda light chain ratio/M panel normal;  on Venofer; November 2020-CT scan question early cirrhosis/no splenomegaly.  No acute process; MARCH 2021- STOOL OCCULT x3- NEGATIVE.   #B12 deficiency-IM every 3 to 4 months  #2014 -question blood transfusion  [patient admitted for stroke]; CHF chronic -[ 2004 on 4 lits; Dr.Arida/[place]]; CT scan chest 2021 -no adenopathy; fibrotic changes; walker; CKD stage III-   #Incidental right axillary mass-s/p US biopsy [PCP] -negative for malignancy.  HISTORY OF PRESENTING ILLNESS: Patient in wheelchair on [place] O2.  Alone.  Debra Saunders 78 y.o.  female multiple comorbidities including COPD/chronic typically failure on oxygen and anemia/likely secondary to ?CKD stage III -as needed IV iron is here for follow-up.   Worsening fatigue.  However fatigue is chronic.  No blood in stools ; no nausea vomiting.  Continues to be compliant with her Eliquis.  Patient continues with oxygen.    Review of Systems  Constitutional:  Positive for malaise/fatigue. Negative for chills, diaphoresis, fever and weight loss.  HENT:  Negative for nosebleeds and sore throat.   Eyes:  Negative for double vision.  Respiratory:  Positive  for shortness of breath. Negative for cough, hemoptysis, sputum production and wheezing.   Cardiovascular:  Negative for chest pain, palpitations, orthopnea and leg swelling.  Gastrointestinal:  Negative for abdominal pain, blood in stool, constipation, heartburn, melena, nausea and vomiting.  Genitourinary:  Negative for dysuria, frequency and urgency.  Musculoskeletal:  Negative for back pain and joint pain.  Skin: Negative.  Negative for itching and rash.  Neurological:  Negative for dizziness, tingling, focal weakness, weakness and headaches.  Endo/Heme/Allergies:  Does not bruise/bleed easily.  Psychiatric/Behavioral:  Negative for depression. The patient is not nervous/anxious and does not have insomnia.     MEDICAL HISTORY:  Past Medical History:  Diagnosis Date   Arthritis    Atrial fibrillation, persistent ([place])    a. s/p TEE-guided DCCV on 08/20/2017; b. 06/2021 Recurrent Afib-->amio/TEE/DCCV.   CAD (coronary artery disease)    a. 01/2012 Cath: nonobs dzs; b. 04/2014 MV: No ischemia; c. 09/2017 Cath: Minimal nonobs RCA dzs, otw nl cors.   Cardiorenal syndrome    a. 09/2017 Creat rose to 2.7 w/ diuresis-->HD x 2.   Chronic back pain    Chronic combined systolic (congestive) and diastolic (congestive) heart failure ([place])    a. Dx 2005 @ [facility];  b. 02/2014 Echo: EF 55-60%; c. 07/2017: EF 30-35%; d. 09/2017 Echo: EF 40-45%; e. 02/2018 Echo: EF 55-60%; f. 08/2019 Echo: EF 60-65%; g. 08/2020 RHC: RA 3, RV 30/1, PA 33/11 (20), PCWP 6; f. 08/2021 Echo: EF 60-65%, no rwma, GrIDD, nl RV fxn, RVSP 40.29mHg, mod dil LA, mod TR.   Chronic respiratory failure (HCC)    a. on home O2   Gastritis    History of intracranial hemorrhage    a. 6/14 described by  neurosurgery as "small frontal posttraumatic SAH " - pt was on xarelto @ the time.   Hyperlipidemia    Hypertension    Interstitial lung disease (HCC)    Lipoma of back    Lymphedema    a. Chronic LLE edema.   NICM (nonischemic cardiomyopathy)  ([place])    a. 09/2017 Echo: EF 40-45%; b. 09/2017 Cath: minor, insignificant CAD; c. 02/2018 Echo: EF 55-60%, Gr1 DD; d. 08/2019 Echo: EF 60-65%, Gr2 DD.   Obesity    PAH (pulmonary artery hypertension) ([place])    a. 09/2017 Echo: PASP 50mHg; b. 09/2017 RHC: RA 23, RV 84/13, PA 84/29, PCWP 20, CO 3.89, CI 1.89. LVEDP 18; c. 08/2020 RHC: RA 3, RV 30/1, PA 33/11 (20), PCWP 6. CO/CI 6.18/3.36. PVR 2.3 WU.   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 06/2021. Veg on PPM lead. Not candidate for extraction-->s/p 6 wks abx.   Sepsis with metabolic encephalopathy ([place]    Sleep apnea    Streptococcal bacteremia    Stroke ([facility] 2014   Subarachnoid hemorrhage ([place] 01/19/2013   Symptomatic bradycardia    a. s/p BSX PPM.   Thyroid nodule    Type II diabetes mellitus ([place]    Urinary incontinence     SURGICAL HISTORY: Past Surgical History:  Procedure Laterality Date   ABDOMINAL HYSTERECTOMY  1969   BREAST BIOPSY Right 10/28/2019   lymph node bx, [place], pending path    CARDIAC CATHETERIZATION  2013   [place] obstructive CAD: Only 20% ostial left circumflex.   CARDIOVERSION N/A 10/12/2017   Procedure: CARDIOVERSION;  Surgeon: GMinna Merritts MD;  [place] [place];  Service: Cardiovascular;  Laterality: N/A;   CARDIOVERSION N/A 07/04/2021   Procedure: CARDIOVERSION;  Surgeon: MLarey Dresser MD;  [facility];  Service: Cardiovascular;  Laterality: N/A;   CATARACT EXTRACTION W/PHACO Right 09/19/2020   Procedure: CATARACT EXTRACTION PHACO AND INTRAOCULAR LENS PLACEMENT (IOC) RIGHT DIABETIC 3.95 00:43.0 9.2%;  Surgeon: BLeandrew Koyanagi MD;  [place]  Service: Ophthalmology;  Laterality: Right;  Pt requests to be last sleep apnea   CATARACT EXTRACTION W/PHACO Left 10/03/2020   Procedure: CATARACT EXTRACTION PHACO AND INTRAOCULAR LENS PLACEMENT ([place] LEFT DIABETIC;  Surgeon: BLeandrew Koyanagi MD;  [place]:  [place]  Service: Ophthalmology;  Laterality: Left;  3.25 0:59.3 5.5%   CHOLECYSTECTOMY     COLONOSCOPY WITH PROPOFOL N/A 09/10/2018   Procedure: COLONOSCOPY WITH PROPOFOL;  Surgeon: EManya Silvas MD;  [facility];  Service: Endoscopy;  Laterality: N/A;   CYST REMOVAL TRUNK  2002   BACK   DIALYSIS/PERMA CATHETER INSERTION N/A 10/06/2017   Procedure: DIALYSIS/PERMA CATHETER INSERTION;  Surgeon: SKatha Cabal MD;  [facility];  Service: Cardiovascular;  Laterality: N/A;   INSERT / REPLACE / REMOVE PACEMAKER     lipoma removal  2014   back   PACEMAKER INSERTION  2015   [employer] dual chamber pacemaker implanted by Dr KCaryl Comesfor symptomatic bradycardia   PERMANENT PACEMAKER INSERTION N/A 01/09/2014   Procedure: PERMANENT PACEMAKER INSERTION;  Surgeon: SDeboraha Sprang MD;  [facility];  Service: Cardiovascular;  Laterality: N/A;   RIGHT HEART CATH N/A 04/19/2018   Procedure: RIGHT HEART CATH;  Surgeon: MLarey Dresser MD;  [place] [facility];  Service: Cardiovascular;  Laterality: N/A;   RIGHT HEART CATH N/A 08/30/2020   Procedure: RIGHT HEART CATH;  Surgeon: Larey Dresser, MD;  Location: [place] LAB;  Service: Cardiovascular;  Laterality: N/A;   RIGHT HEART CATH N/A 02/11/2022   Procedure: RIGHT HEART CATH;  Surgeon: Larey Dresser, MD;  Location: [place] CV LAB;  Service: Cardiovascular;  Laterality: N/A;   RIGHT/LEFT HEART CATH AND CORONARY ANGIOGRAPHY N/A 10/19/2017   Procedure: RIGHT/LEFT HEART CATH AND CORONARY ANGIOGRAPHY;  Surgeon: Wellington Hampshire, MD;  Location: [place] CV LAB;  Service: Cardiovascular;  Laterality: N/A;   TEE WITHOUT CARDIOVERSION N/A 08/20/2017   Procedure: TRANSESOPHAGEAL ECHOCARDIOGRAM (TEE) & Direct current cardioversion;  Surgeon: Minna Merritts, MD;  [facility];  Service: Cardiovascular;  Laterality: N/A;   TEE WITHOUT CARDIOVERSION N/A 06/28/2021    Procedure: TRANSESOPHAGEAL ECHOCARDIOGRAM (TEE);  Surgeon: Minna Merritts, MD;  Location: [facility];  Service: Cardiovascular;  Laterality: N/A;   TEE WITHOUT CARDIOVERSION N/A 07/04/2021   Procedure: TRANSESOPHAGEAL ECHOCARDIOGRAM (TEE);  Surgeon: Larey Dresser, MD;  [facility];  Service: Cardiovascular;  Laterality: N/A;   TRIGGER FINGER RELEASE      SOCIAL HISTORY: Social History   Socioeconomic History   Marital status: Divorced    Spouse name: Not on file   Number of children: 4   Years of education: 12   Highest education level: High school graduate  Occupational History   Occupation: Retired- factory work    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 Has 4 children. No pets.      Work - Restaurant manager, fast food, retired      Diet - regular      02/14/2019: reports has Meals On Wheels delivered to her home;       Remote hx of smoking > 30 years; No alcohol-    Social Determinants of Health   Financial Resource Strain: Low Risk  (02/19/2022)   Overall Financial Resource Strain (CARDIA)    Difficulty of Paying Living Expenses: Not hard at all  Food Insecurity: No Food Insecurity (05/22/2022)   Hunger Vital Sign    Worried About Running Out of Food in the Last Year: Never true    Ran Out of Food in the Last Year: Never true  Transportation Needs: Unmet Transportation Needs (06/18/2022)   PRAPARE - Hydrologist (Medical): Yes    Lack of Transportation (Non-Medical): Yes   Physical Activity: Inactive (01/05/2018)   Exercise Vital Sign    Days of Exercise per Week: 0 days    Minutes of Exercise per Session: 0 min  Stress: No Stress Concern Present (02/19/2022)   [place] of Stress : Not at all  Social Connections: Socially Isolated (02/19/2022)   Social Connection and Isolation Panel [NHANES]    Frequency of Communication with Friends and Family: More than three times a week    Frequency of Social  Gatherings with Friends and Family: Once a week    Attends Religious Services: Never    Marine scientist or Organizations: No    Attends Archivist Meetings: Never    Marital Status: Divorced  Human resources officer Violence: Not At Risk (02/19/2022)   Humiliation, Afraid, Rape, and Kick questionnaire    Fear of Current or Ex-Partner: No    Emotionally Abused: No    Physically Abused: No    Sexually Abused: No    FAMILY HISTORY: Family History  Problem Relation Age of Onset   Heart disease Mother    Hypertension Mother    COPD Mother        was a smoker   Thyroid disease Mother    Hypertension Father    Hypertension Sister    Thyroid disease Sister    Breast cancer Sister    Hypertension Sister    Thyroid disease Sister    Bone cancer Sister    Stroke Son    Breast cancer Maternal Aunt    Kidney disease Neg Hx    Bladder Cancer Neg Hx     ALLERGIES:  is allergic to aspirin and other.  MEDICATIONS:  Current Outpatient Medications  Medication Sig Dispense Refill   acetaminophen (TYLENOL) 325 MG tablet Take 325-650 mg by mouth every 6 (six) hours as needed for mild pain.     albuterol (ACCUNEB) 0.63 MG/3ML nebulizer solution Take 1 ampule by nebulization every 6 (six) hours as needed for wheezing.     cefadroxil (DURICEF) 500 MG capsule Take 1 capsule (500 mg total) by mouth 2 (two) times daily. 180 capsule 3   Difluprednate 0.05 % EMUL Place 1 drop into both  eyes 3 (three) times daily.     doxycycline (ADOXA) 100 MG tablet Take 1 tablet (100 mg total) by mouth 2 (two) times daily. 12 tablet 0   DULoxetine (CYMBALTA) 60 MG capsule Take 60 mg by mouth daily.     ELIQUIS 5 MG TABS tablet Take 1 tablet by mouth twice daily 180 tablet 0   gabapentin (NEURONTIN) 300 MG capsule Take 2 capsules (600 mg total) by mouth 3 (three) times daily. (Patient taking differently: Take 300 mg by mouth 2 (two) times daily. once a day) 540 capsule 1   macitentan (OPSUMIT) 10 MG tablet Take 1 tablet (10 mg total) by mouth daily. 90 tablet 3   meclizine (ANTIVERT) 25 MG tablet Take 1 tablet (25 mg total) by mouth 3 (three) times daily as needed for dizziness. 30 tablet 1   Menthol, Topical Analgesic, (BIOFREEZE EX) Apply 1 application topically 4 (four) times daily as needed (for pain).     metolazone (ZAROXOLYN) 2.5 MG tablet Take 2.5 mg by mouth every Monday, Wednesday, and Friday.     midodrine (PROAMATINE) 5 MG tablet Take one tablet at 12 noon, 4pm and 8pm 270 tablet 3   rosuvastatin (CRESTOR) 10 MG tablet Take 1 tablet (10 mg total) by mouth daily. 90 tablet 3   Selexipag (UPTRAVI) 1600 MCG TABS Take 1 tablet (1,600 mcg total) by mouth 2 (two) times daily. 60 tablet 11   sildenafil (REVATIO) 20 MG tablet Take 1 tablet (20 mg total) by mouth 3 (three) times daily. 90 tablet 3   spironolactone (ALDACTONE) 25 MG tablet Take 0.5 tablet (12.5 mg) by mouth once daily (Patient taking differently: Take 25 mg by mouth daily. She has been taking a whole pill)     torsemide (DEMADEX) 20 MG tablet  Take 3 tablets (60 mg total) by mouth 2 (two) times daily. Patient takes 3 tablets by mouth twice a day. (Patient taking differently: Take 40 mg by mouth 2 (two) times daily. Been taking '40mg'$  twice a day) 180 tablet 6   potassium chloride SA (KLOR-CON M) 20 MEQ tablet Take 2 tablets (40 mEq total) by mouth daily. (Patient not taking: Reported on 08/19/2022) 60 tablet 2   No current  facility-administered medications for this visit.      PHYSICAL EXAMINATION:   Vitals:   08/19/22 1300  BP: 106/66  Pulse: 61  Resp: 18  Temp: (!) 96.6 F (35.9 C)  SpO2: 98%   Filed Weights   08/19/22 1300  Weight: 197 lb 14.4 oz (89.8 kg)    Physical Exam Constitutional:      Comments: Patient in wheelchair.  Alone.  She is on O2 nasal cannula.  HENT:     Head: Normocephalic and atraumatic.     Mouth/Throat:     Pharynx: No oropharyngeal exudate.  Eyes:     Pupils: Pupils are equal, round, and reactive to light.  Cardiovascular:     Rate and Rhythm: Normal rate and regular rhythm.  Pulmonary:     Effort: No respiratory distress.     Breath sounds: No wheezing.     Comments: Decreased air entry bilaterally.  No wheeze or crackles. Abdominal:     General: Bowel sounds are normal. There is no distension.     Palpations: Abdomen is soft. There is no mass.     Tenderness: There is no abdominal tenderness. There is no guarding or rebound.  Musculoskeletal:        General: No tenderness. Normal range of motion.     Cervical back: Normal range of motion and neck supple.  Skin:    General: Skin is warm.  Neurological:     Mental Status: She is alert and oriented to person, place, and time.  Psychiatric:        Mood and Affect: Affect normal.     LABORATORY DATA:  I have reviewed the data as listed Lab Results  Component Value Date   WBC 4.9 08/19/2022   HGB 9.9 (L) 08/19/2022   HCT 33.3 (L) 08/19/2022   MCV 91.7 08/19/2022   PLT 184 08/19/2022   Recent Labs    08/29/21 1037 09/26/21 1306 10/07/21 1735 11/25/21 0346 12/02/21 1254 01/28/22 1540 02/11/22 1037 04/22/22 1424 06/18/22 1420 08/19/22 1307  NA 139 140   < > 142   < > 140   < > 140 141 137  K 4.7 2.9*   < > 3.9   < > 3.2*   < > 5.4* 4.0 3.4*  CL 99 101   < > 104   < > 95*   < > 107 107 104  CO2 27 31   < > 29   < > 36*   < > '27 27 26  '$ GLUCOSE 113* 110*   < > 129*   < > 106*   < > 126*  102* 118*  BUN 27 14   < > 20   < > 59*   < > 40* 18 25*  CREATININE 1.07* 1.19*   < > 1.23*   < > 1.66*   < > 1.69* 1.01* 1.15*  CALCIUM 9.3 8.9   < > 9.2   < > 9.1   < > 9.6 8.5* 8.6*  GFRNONAA  --  47*   < >  --    < >  31*   < > 31* 57* 48*  PROT 7.2 6.9  --  6.6  --  7.1  --   --   --   --   ALBUMIN 3.8 3.3*  --   --   --  3.8  --   --   --   --   AST 19 13*  --  12  --  13*  --   --   --   --   ALT 22 9  --  10  --  12  --   --   --   --   ALKPHOS 266* 93  --   --   --  97  --   --   --   --   BILITOT 0.2 0.6  --  0.4  --  0.9  --   --   --   --    < > = values in this interval not displayed.     No results found.   Iron deficiency # Chronic Anemia [BL10-11]-iron deficiency/? CKD stage III  currently s/p Venofer. JAN 2023 [sat 9%; ferrtin-700+- hospital/sepsis];   # [DEC 2023- ferritin 343; iron sat 18%]; continue gentle iron 1 pill a day.    Proceed with venofer today. Also discussed the possible role of Retacrit with patient's hemoglobin does not improve.   # Mild Hypokalemia- K 3.4- continue Kdur BID.   # Etiology of anemia-likely CKD stage III [Dr.Lateef]/ [chronic diarrhea/malabsorption]-monitor closely. Stable.   #History of DVT/s/p IVC filter; ?  A. Fib-Eliquis-stable.   #Borderline low B12-OCT 2023- 230; s/p B12 injection.; proceed with injection q 2- 4 months.    # Vaginal itching/discharge-rs/p Bx- negative.   # DISPOSITION: # proceed  b12 today; and venofer today; no retacrit # Follow up in 3 months-MD; labs-- cbc;bmp; b12 levels; iron studies; ferritin--possible Venofer OR retacrit;  B12 injection- Dr.B  Cc; Tammi Klippel  All questions were answered. The patient knows to call the clinic with any problems, questions or concerns.    Cammie Sickle, MD 08/19/2022 1:53 PM

## 2022-08-22 ENCOUNTER — Ambulatory Visit: Payer: Self-pay

## 2022-08-22 NOTE — Patient Outreach (Signed)
  Care Coordination   Follow Up Visit Note   08/22/2022 Name: Debra Saunders MRN: [id number] DOB: 08-21-1945  Debra Saunders is a 77 y.o. year old female who sees Debra Saunders, [name], MD for primary care. I spoke with  Debra Saunders by phone today.  What matters to the patients health and wellness today?  Patient states she continues to have dizziness and fatigue. She reports having follow up with oncology this week and having infusion.  Patient denies any falls. Patient states she is having difficulty getting her potassium and Difluprednate eye drops filled. She reports being out of her potassium the entire month of January 2024.   Telephone call made to [facility], pharmacist at [facility].  She states she will fill patients potassium and have ready after 5 pm today.  Debra Saunders states patients eye drops were denied by provider.  Returned call to patient to inform her she can pick up potassium prescription today after 5 pm.  Patient states it has been a while since she's seen her eye doctor.  Advised to schedule appointment.     Goals Addressed             This Visit's Progress    Patient Stated:  Managing dizziness and fatigue       Interventions Today    Flowsheet Row Most Recent Value  Chronic Disease   Chronic disease during today's visit Other  [dizziness, fatigue]  General Interventions   General Interventions Discussed/Reviewed General Interventions Reviewed, Doctor Visits, Labs  [patient verbally agreed to next telephone outreach with RNCM.]  Labs --  [Discussed most recent Hgb results.]  Doctor Visits Discussed/Reviewed Doctor Visits Reviewed  Jolinda Croak upcoming/ scheduled appointments. Patient advised to schedule eye exam and follow up appointment with primary care provider. Coco called for potassium refill and update on eye drops provided]  Education Interventions   Education Provided Provided Printed Education  [Fall prevention article sent to patient. Patient  notified to pick up potassium RX from the pharmacy after 5 pm today.]  Pharmacy Interventions   Pharmacy Dicussed/Reviewed Pharmacy Topics Reviewed  [medications reviewed and compliance discussed.  Patient advised to take new iron medication as prescribed. Advised to eat fruits/ vegetables and drink plenty of water to avoid constipation. Advised to notify provider if constipation becomes an issue.]  Safety Interventions   Safety Discussed/Reviewed Fall Risk  [Fall safety discussed. Advised to always use walker when ambulating.  Advised patient rise from sitting/ laying down slowly and avoid sudden moves that may contribute to dizziness. Advised to notify provider if dizziness worsens.]               SDOH assessments and interventions completed:  No     Care Coordination Interventions:  Yes, provided   Follow up plan: Follow up call scheduled for 09/29/22    Encounter Outcome:  Pt. Visit Completed   Quinn Plowman RN,BSN,CCM [place] [phone] direct line

## 2022-08-22 NOTE — Patient Instructions (Signed)
Visit Information  Thank you for taking time to visit with me today. Please don't hesitate to contact me if I can be of assistance to you.   Following are the goals we discussed today:  You are able to pick up your potassium prescription after 5 pm today 08/22/22. Call to schedule eye and primary care provider appointments Always use walker when ambulating for safety Rise slow from sitting/ standing positions and avoid sudden movements to help manage dizziness  Our next appointment is by telephone on 08/29/22 at 2 pm  Please call the care guide team at [phone] if you need to cancel or reschedule your appointment.   If you are experiencing a Mental Health or [place] or need someone to talk to, please call the Suicide and Crisis Lifeline: 988 call 1-800-273-TALK (toll free, 24 hour hotline)  Patient verbalizes understanding of instructions and care plan provided today and agrees to view in [place]. Active MyChart status and patient understanding of how to access instructions and care plan via MyChart confirmed with patient.     Quinn Plowman RN,BSN,CCM [place] Coordination [phone] direct line

## 2022-08-26 ENCOUNTER — Ambulatory Visit (INDEPENDENT_AMBULATORY_CARE_PROVIDER_SITE_OTHER): Payer: Medicare HMO

## 2022-08-26 DIAGNOSIS — I495 Sick sinus syndrome: Secondary | ICD-10-CM

## 2022-08-27 LAB — CUP PACEART REMOTE DEVICE CHECK
Battery Remaining Longevity: 42 mo
Battery Remaining Percentage: 48 %
Brady Statistic RA Percent Paced: 86 %
Brady Statistic RV Percent Paced: 98 %
Date Time Interrogation Session: 20240305065000
Implantable Lead Connection Status: 753985
Implantable Lead Connection Status: 753985
Implantable Lead Implant Date: 20150720
Implantable Lead Implant Date: 20150720
Implantable Lead Location: 753859
Implantable Lead Location: 753860
Implantable Lead Model: 5076
Implantable Lead Model: 5076
Implantable Pulse Generator Implant Date: 20150720
Lead Channel Impedance Value: 346 Ohm
Lead Channel Impedance Value: 514 Ohm
Lead Channel Pacing Threshold Amplitude: 0.7 V
Lead Channel Pacing Threshold Pulse Width: 0.4 ms
Lead Channel Setting Pacing Amplitude: 2 V
Lead Channel Setting Pacing Amplitude: 2.4 V
Lead Channel Setting Pacing Pulse Width: 0.4 ms
Lead Channel Setting Sensing Sensitivity: 2.5 mV
Pulse Gen Serial Number: 390330
Zone Setting Status: 755011

## 2022-09-01 DIAGNOSIS — H2013 Chronic iridocyclitis, bilateral: Secondary | ICD-10-CM | POA: Diagnosis not present

## 2022-09-01 DIAGNOSIS — Z961 Presence of intraocular lens: Secondary | ICD-10-CM | POA: Diagnosis not present

## 2022-09-02 ENCOUNTER — Other Ambulatory Visit (HOSPITAL_COMMUNITY): Payer: Self-pay | Admitting: Family Medicine

## 2022-09-02 ENCOUNTER — Other Ambulatory Visit (HOSPITAL_COMMUNITY): Payer: Self-pay | Admitting: Cardiology

## 2022-09-29 ENCOUNTER — Ambulatory Visit: Payer: Self-pay

## 2022-09-29 NOTE — Patient Outreach (Signed)
  Care Coordination   Follow Up Visit Note   09/29/2022 Name: Debra Saunders MRN: [id number] DOB: 28-Mar-1946  Debra Saunders is a 77 y.o. year old female who sees Birdie Sons, Yehuda Mao, MD for primary care. I spoke with  [name] by phone today.  What matters to the patients health and wellness today?  Patient states she continues to have dizziness/ fatigue. She states she has noted a slight improvement in that the dizziness does not happen everyday like it did. Patient states she continues to have infusions and fatigue is relieved for 1-2 days after infusion administered.  Patient denies any falls. Reports receiving fall prevention education information. Patient reports having appointment with eye doctor on 09/01/22. She states she now has her potassium medication and eye drops. Patient states she was also prescribed an eye dilating medication from her eye doctor that had to be ordered.  Patient denies any further needs at this time.  Advised to call RNCM or primary care provider if care coordination services needed in the future.    Goals Addressed             This Visit's Progress    COMPLETED: Patient Stated:  Managing dizziness and fatigue       Interventions Today    Flowsheet Row Most Recent Value  Chronic Disease   Chronic disease during today's visit Other  [dizziness, fatigue/ anemia]  General Interventions   General Interventions Discussed/Reviewed General Interventions Reviewed, Doctor Visits  [evaluation of current treatment plan related to dizziness/ fatigue and patients adherence to plan as recommended by provider.  Assessed for ongoing dizziness/ fatigue symtoms.]  Doctor Visits Discussed/Reviewed Doctor Visits Reviewed  [facility] scheduled/ upcoming provider appointments. Confirmed patient had follow up with eye doctor.]  Education Interventions   Education Provided Provided Education  Provided Verbal Education On Other  [Advised to not stand up quickly but pause for  a few seconds when going from laying down/ sitting to standing.]  Pharmacy Interventions   Pharmacy Dicussed/Reviewed Pharmacy Topics Reviewed  [medications reviewed and compliance discussed.  Inquired if patient able to obtain potassium medication and eye drops.]  Safety Interventions   Safety Discussed/Reviewed Fall Risk  [assessed for falls. Confirmed patient received fall prevention education information sent to her.]               SDOH assessments and interventions completed:  No     Care Coordination Interventions:  Yes, provided   Follow up plan: No further intervention required.   Encounter Outcome:  Pt. Visit Completed   George Ina RN,BSN,CCM [facility] Care Coordination [phone] direct line

## 2022-10-01 NOTE — Progress Notes (Signed)
Remote pacemaker transmission.   

## 2022-10-14 ENCOUNTER — Other Ambulatory Visit (HOSPITAL_COMMUNITY): Payer: Self-pay | Admitting: Cardiology

## 2022-10-14 ENCOUNTER — Other Ambulatory Visit (HOSPITAL_COMMUNITY): Payer: Self-pay | Admitting: Family Medicine

## 2022-10-14 ENCOUNTER — Other Ambulatory Visit: Payer: Self-pay | Admitting: *Deleted

## 2022-10-14 DIAGNOSIS — I4819 Other persistent atrial fibrillation: Secondary | ICD-10-CM

## 2022-10-14 MED ORDER — SILDENAFIL CITRATE 20 MG PO TABS: 20.0000 mg | ORAL_TABLET | Freq: Three times a day (TID) | ORAL | 3 refills | Status: DC

## 2022-10-14 MED ORDER — APIXABAN 5 MG PO TABS: 5.0000 mg | ORAL_TABLET | Freq: Two times a day (BID) | ORAL | 3 refills | Status: DC

## 2022-10-16 ENCOUNTER — Other Ambulatory Visit (HOSPITAL_COMMUNITY): Payer: Self-pay

## 2022-10-17 ENCOUNTER — Ambulatory Visit (INDEPENDENT_AMBULATORY_CARE_PROVIDER_SITE_OTHER): Payer: Medicare HMO | Admitting: Family Medicine

## 2022-10-17 ENCOUNTER — Ambulatory Visit
Admission: RE | Admit: 2022-10-17 | Discharge: 2022-10-17 | Disposition: A | Discharge status: Home / Self Care | Payer: Medicare HMO | Source: Ambulatory Visit | Attending: Family Medicine | Admitting: Family Medicine

## 2022-10-17 ENCOUNTER — Other Ambulatory Visit (HOSPITAL_COMMUNITY)
Admission: RE | Admit: 2022-10-17 | Discharge: 2022-10-17 | Disposition: A | Discharge status: Home / Self Care | Payer: Medicare HMO | Source: Ambulatory Visit | Attending: Family Medicine | Admitting: Family Medicine

## 2022-10-17 ENCOUNTER — Encounter: Payer: Self-pay | Admitting: Family Medicine

## 2022-10-17 ENCOUNTER — Other Ambulatory Visit: Payer: Self-pay | Admitting: Family Medicine

## 2022-10-17 VITALS — BP 116/68 | HR 45 | Temp 98.0°F | Ht 60.0 in | Wt 196.6 lb

## 2022-10-17 DIAGNOSIS — K649 Unspecified hemorrhoids: Secondary | ICD-10-CM | POA: Diagnosis not present

## 2022-10-17 DIAGNOSIS — R8290 Unspecified abnormal findings in urine: Secondary | ICD-10-CM

## 2022-10-17 DIAGNOSIS — I2510 Atherosclerotic heart disease of native coronary artery without angina pectoris: Secondary | ICD-10-CM | POA: Diagnosis present

## 2022-10-17 DIAGNOSIS — Z8616 Personal history of COVID-19: Secondary | ICD-10-CM | POA: Diagnosis not present

## 2022-10-17 DIAGNOSIS — J986 Disorders of diaphragm: Secondary | ICD-10-CM | POA: Diagnosis not present

## 2022-10-17 DIAGNOSIS — K648 Other hemorrhoids: Secondary | ICD-10-CM | POA: Diagnosis not present

## 2022-10-17 DIAGNOSIS — D631 Anemia in chronic kidney disease: Secondary | ICD-10-CM | POA: Diagnosis present

## 2022-10-17 DIAGNOSIS — K635 Polyp of colon: Secondary | ICD-10-CM | POA: Diagnosis not present

## 2022-10-17 DIAGNOSIS — G8929 Other chronic pain: Secondary | ICD-10-CM | POA: Diagnosis present

## 2022-10-17 DIAGNOSIS — R1010 Upper abdominal pain, unspecified: Secondary | ICD-10-CM

## 2022-10-17 DIAGNOSIS — D126 Benign neoplasm of colon, unspecified: Secondary | ICD-10-CM | POA: Diagnosis not present

## 2022-10-17 DIAGNOSIS — J849 Interstitial pulmonary disease, unspecified: Secondary | ICD-10-CM | POA: Diagnosis present

## 2022-10-17 DIAGNOSIS — Z136 Encounter for screening for cardiovascular disorders: Secondary | ICD-10-CM | POA: Diagnosis not present

## 2022-10-17 DIAGNOSIS — K219 Gastro-esophageal reflux disease without esophagitis: Secondary | ICD-10-CM | POA: Diagnosis present

## 2022-10-17 DIAGNOSIS — N898 Other specified noninflammatory disorders of vagina: Secondary | ICD-10-CM | POA: Insufficient documentation

## 2022-10-17 DIAGNOSIS — R079 Chest pain, unspecified: Secondary | ICD-10-CM | POA: Diagnosis not present

## 2022-10-17 DIAGNOSIS — D123 Benign neoplasm of transverse colon: Secondary | ICD-10-CM | POA: Diagnosis not present

## 2022-10-17 DIAGNOSIS — N179 Acute kidney failure, unspecified: Secondary | ICD-10-CM | POA: Diagnosis not present

## 2022-10-17 DIAGNOSIS — I428 Other cardiomyopathies: Secondary | ICD-10-CM | POA: Diagnosis present

## 2022-10-17 DIAGNOSIS — I2721 Secondary pulmonary arterial hypertension: Secondary | ICD-10-CM | POA: Diagnosis present

## 2022-10-17 DIAGNOSIS — A630 Anogenital (venereal) warts: Secondary | ICD-10-CM | POA: Diagnosis not present

## 2022-10-17 DIAGNOSIS — E785 Hyperlipidemia, unspecified: Secondary | ICD-10-CM | POA: Diagnosis present

## 2022-10-17 DIAGNOSIS — K921 Melena: Secondary | ICD-10-CM

## 2022-10-17 DIAGNOSIS — I272 Pulmonary hypertension, unspecified: Secondary | ICD-10-CM | POA: Diagnosis not present

## 2022-10-17 DIAGNOSIS — R1084 Generalized abdominal pain: Secondary | ICD-10-CM | POA: Diagnosis not present

## 2022-10-17 DIAGNOSIS — Z9981 Dependence on supplemental oxygen: Secondary | ICD-10-CM | POA: Diagnosis not present

## 2022-10-17 DIAGNOSIS — I5032 Chronic diastolic (congestive) heart failure: Secondary | ICD-10-CM | POA: Diagnosis not present

## 2022-10-17 DIAGNOSIS — N1832 Chronic kidney disease, stage 3b: Secondary | ICD-10-CM | POA: Diagnosis present

## 2022-10-17 DIAGNOSIS — E1122 Type 2 diabetes mellitus with diabetic chronic kidney disease: Secondary | ICD-10-CM | POA: Diagnosis present

## 2022-10-17 DIAGNOSIS — J9611 Chronic respiratory failure with hypoxia: Secondary | ICD-10-CM | POA: Diagnosis present

## 2022-10-17 DIAGNOSIS — E669 Obesity, unspecified: Secondary | ICD-10-CM | POA: Diagnosis present

## 2022-10-17 DIAGNOSIS — R001 Bradycardia, unspecified: Secondary | ICD-10-CM | POA: Diagnosis not present

## 2022-10-17 DIAGNOSIS — K5730 Diverticulosis of large intestine without perforation or abscess without bleeding: Secondary | ICD-10-CM | POA: Diagnosis not present

## 2022-10-17 DIAGNOSIS — Z87891 Personal history of nicotine dependence: Secondary | ICD-10-CM | POA: Diagnosis not present

## 2022-10-17 DIAGNOSIS — I9589 Other hypotension: Secondary | ICD-10-CM | POA: Diagnosis present

## 2022-10-17 DIAGNOSIS — R109 Unspecified abdominal pain: Secondary | ICD-10-CM | POA: Diagnosis present

## 2022-10-17 DIAGNOSIS — R1013 Epigastric pain: Secondary | ICD-10-CM | POA: Diagnosis present

## 2022-10-17 DIAGNOSIS — I4819 Other persistent atrial fibrillation: Secondary | ICD-10-CM | POA: Diagnosis not present

## 2022-10-17 DIAGNOSIS — K2960 Other gastritis without bleeding: Secondary | ICD-10-CM | POA: Diagnosis not present

## 2022-10-17 DIAGNOSIS — E876 Hypokalemia: Secondary | ICD-10-CM | POA: Diagnosis present

## 2022-10-17 DIAGNOSIS — I130 Hypertensive heart and chronic kidney disease with heart failure and stage 1 through stage 4 chronic kidney disease, or unspecified chronic kidney disease: Secondary | ICD-10-CM | POA: Diagnosis present

## 2022-10-17 DIAGNOSIS — I4821 Permanent atrial fibrillation: Secondary | ICD-10-CM | POA: Diagnosis present

## 2022-10-17 DIAGNOSIS — D120 Benign neoplasm of cecum: Secondary | ICD-10-CM | POA: Diagnosis not present

## 2022-10-17 DIAGNOSIS — I10 Essential (primary) hypertension: Secondary | ICD-10-CM | POA: Diagnosis not present

## 2022-10-17 DIAGNOSIS — D122 Benign neoplasm of ascending colon: Secondary | ICD-10-CM | POA: Diagnosis not present

## 2022-10-17 DIAGNOSIS — R933 Abnormal findings on diagnostic imaging of other parts of digestive tract: Secondary | ICD-10-CM | POA: Diagnosis not present

## 2022-10-17 DIAGNOSIS — G4733 Obstructive sleep apnea (adult) (pediatric): Secondary | ICD-10-CM | POA: Diagnosis present

## 2022-10-17 DIAGNOSIS — F419 Anxiety disorder, unspecified: Secondary | ICD-10-CM | POA: Diagnosis present

## 2022-10-17 DIAGNOSIS — F32A Depression, unspecified: Secondary | ICD-10-CM | POA: Diagnosis present

## 2022-10-17 LAB — COMPREHENSIVE METABOLIC PANEL
ALT: 9 U/L (ref 0–35)
AST: 13 U/L (ref 0–37)
Albumin: 4.2 g/dL (ref 3.5–5.2)
Alkaline Phosphatase: 114 U/L (ref 39–117)
BUN: 26 mg/dL — ABNORMAL HIGH (ref 6–23)
CO2: 33 mEq/L — ABNORMAL HIGH (ref 19–32)
Calcium: 9 mg/dL (ref 8.4–10.5)
Chloride: 98 mEq/L (ref 96–112)
Creatinine, Ser: 1.14 mg/dL (ref 0.40–1.20)
GFR: 45.86 mL/min — ABNORMAL LOW (ref >60.00)
Glucose, Bld: 125 mg/dL — ABNORMAL HIGH (ref 70–99)
Potassium: 3.3 mEq/L — ABNORMAL LOW (ref 3.5–5.1)
Sodium: 140 mEq/L (ref 135–145)
Total Bilirubin: 0.4 mg/dL (ref 0.2–1.2)
Total Protein: 7.3 g/dL (ref 6.0–8.3)

## 2022-10-17 LAB — CBC WITH DIFFERENTIAL/PLATELET
Basophils Absolute: 0 10*3/uL (ref 0.0–0.1)
Basophils Relative: 0.8 % (ref 0.0–3.0)
Eosinophils Absolute: 0.2 10*3/uL (ref 0.0–0.7)
Eosinophils Relative: 3 % (ref 0.0–5.0)
HCT: 32.1 % — ABNORMAL LOW (ref 36.0–46.0)
Hemoglobin: 10.1 g/dL — ABNORMAL LOW (ref 12.0–15.0)
Lymphocytes Relative: 23.9 % (ref 12.0–46.0)
Lymphs Abs: 1.5 10*3/uL (ref 0.7–4.0)
MCHC: 31.4 g/dL (ref 30.0–36.0)
MCV: 88.6 fl (ref 78.0–100.0)
Monocytes Absolute: 0.5 10*3/uL (ref 0.1–1.0)
Monocytes Relative: 7.4 % (ref 3.0–12.0)
Neutro Abs: 4.2 10*3/uL (ref 1.4–7.7)
Neutrophils Relative %: 64.9 % (ref 43.0–77.0)
Platelets: 187 10*3/uL (ref 150.0–400.0)
RBC: 3.62 Mil/uL — ABNORMAL LOW (ref 3.87–5.11)
RDW: 13.3 % (ref 11.5–15.5)
WBC: 6.4 10*3/uL (ref 4.0–10.5)

## 2022-10-17 LAB — POCT URINALYSIS DIPSTICK
Bilirubin, UA: NEGATIVE
Glucose, UA: NEGATIVE
Ketones, UA: NEGATIVE
Leukocytes, UA: NEGATIVE
Nitrite, UA: NEGATIVE
Protein, UA: NEGATIVE
Spec Grav, UA: 1.01 (ref 1.010–1.025)
Urobilinogen, UA: 0.2 E.U./dL
pH, UA: 5.5 (ref 5.0–8.0)

## 2022-10-17 LAB — URINALYSIS, MICROSCOPIC ONLY

## 2022-10-17 LAB — POCT I-STAT CREATININE: Creatinine, Ser: 1.3 mg/dL — ABNORMAL HIGH (ref 0.44–1.00)

## 2022-10-17 LAB — LIPASE: Lipase: 153 U/L — ABNORMAL HIGH (ref 11.0–59.0)

## 2022-10-17 MED ORDER — IOHEXOL 300 MG/ML  SOLN
100.0000 mL | Freq: Once | INTRAMUSCULAR | Status: AC | PRN
  Administered 2022-10-17: 100 mL via INTRAVENOUS

## 2022-10-17 NOTE — Assessment & Plan Note (Addendum)
Acute onset issue.  Undetermined etiology.  Discussed going to the emergency room for imaging and lab work given the significance of her pain and her exam though she declines this at this time.  I did discuss that she will likely end up in the ED for evaluation even after we start the work up. Will get a stat CT abdomen pelvis with contrast.  Will get stat labs as well.  Will check urinalysis given her urine symptoms.  She was advised to go to the emergency room immediately if she had any worsening symptoms or if she is not able to tolerate her current level of discomfort.

## 2022-10-17 NOTE — Progress Notes (Signed)
Urine sent for micro

## 2022-10-17 NOTE — Addendum Note (Signed)
Addended by: Prince Solian A on: 10/17/2022 12:25 PM   Modules accepted: Orders

## 2022-10-17 NOTE — Assessment & Plan Note (Signed)
Patient notes these are chronic issue.  She has seen a specialist previously.  They advised her she needed to see dermatology to consider removal if she wanted.

## 2022-10-17 NOTE — Assessment & Plan Note (Signed)
Concerning for melena.  We are checking lab work today.  May need to consider holding her Eliquis pending her labs.

## 2022-10-17 NOTE — Addendum Note (Signed)
Addended by: Prince Solian A on: 10/17/2022 12:26 PM   Modules accepted: Orders

## 2022-10-17 NOTE — Progress Notes (Addendum)
Debra Alar, MD Phone: [phone]  Debra Saunders is a 77 y.o. female who presents today for same day visit.   Abdominal pain: This has been going on for 2 weeks.  She describes it as a 10 out of 10 pain.  It hurts all over her abdomen.  She has had some nausea.  She had vomiting 1 time at the outset that was black.  No bright red blood.  She has chronic diarrhea for the last 2 years.  She has been having melena with this.  She notes no bright red blood in her stool.  She does report some dysuria.  No urinary frequency.  She has chronic urinary urgency.  She notes fever of 101 F 2 days ago.  She does note some vaginal discharge for 2 to 3 weeks.  She has not sexually active.  She reports she is status post cholecystectomy, appendectomy, and hysterectomy.  Social History   Tobacco Use  Smoking Status Former   Packs/day: 0.30   Years: 2.00   Additional pack years: 0.00   Total pack years: 0.60   Types: Cigarettes   Quit date: 06/23/1990   Years since quitting: 32.3  Smokeless Tobacco Never    Current Outpatient Medications on File Prior to Visit  Medication Sig Dispense Refill   acetaminophen (TYLENOL) 325 MG tablet Take 325-650 mg by mouth every 6 (six) hours as needed for mild pain.     albuterol (ACCUNEB) 0.63 MG/3ML nebulizer solution Take 1 ampule by nebulization every 6 (six) hours as needed for wheezing.     apixaban (ELIQUIS) 5 MG TABS tablet Take 1 tablet (5 mg total) by mouth 2 (two) times daily. 180 tablet 3   cefadroxil (DURICEF) 500 MG capsule Take 1 capsule (500 mg total) by mouth 2 (two) times daily. 180 capsule 3   Difluprednate 0.05 % EMUL Place 1 drop into both eyes 3 (three) times daily.     doxycycline (ADOXA) 100 MG tablet Take 1 tablet (100 mg total) by mouth 2 (two) times daily. 12 tablet 0   DULoxetine (CYMBALTA) 60 MG capsule Take 60 mg by mouth daily.     gabapentin (NEURONTIN) 300 MG capsule Take 2 capsules (600 mg total) by mouth 3 (three) times daily.  (Patient taking differently: Take 300 mg by mouth 2 (two) times daily. once a day) 540 capsule 1   macitentan (OPSUMIT) 10 MG tablet Take 1 tablet (10 mg total) by mouth daily. 90 tablet 3   meclizine (ANTIVERT) 25 MG tablet Take 1 tablet (25 mg total) by mouth 3 (three) times daily as needed for dizziness. 30 tablet 1   Menthol, Topical Analgesic, (BIOFREEZE EX) Apply 1 application topically 4 (four) times daily as needed (for pain).     metolazone (ZAROXOLYN) 2.5 MG tablet Take 2.5 mg by mouth every Monday, Wednesday, and Friday.     midodrine (PROAMATINE) 5 MG tablet Take one tablet at 12 noon, 4pm and 8pm 270 tablet 3   potassium chloride SA (KLOR-CON M) 20 MEQ tablet Take 2 tablets (40 mEq total) by mouth daily. 60 tablet 2   rosuvastatin (CRESTOR) 10 MG tablet Take 1 tablet (10 mg total) by mouth daily. 90 tablet 3   Selexipag (UPTRAVI) 1600 MCG TABS Take 1 tablet (1,600 mcg total) by mouth 2 (two) times daily. 60 tablet 11   sildenafil (REVATIO) 20 MG tablet Take 1 tablet (20 mg total) by mouth 3 (three) times daily. 90 tablet 3   spironolactone (ALDACTONE)  25 MG tablet Take 1 tablet (25 mg total) by mouth daily. She has been taking a whole pill 30 tablet 3   torsemide (DEMADEX) 20 MG tablet Take 3 tablets (60 mg total) by mouth 2 (two) times daily. Patient takes 3 tablets by mouth twice a day. (Patient taking differently: Take 40 mg by mouth 2 (two) times daily. Been taking 40mg  twice a day) 180 tablet 6   No current facility-administered medications on file prior to visit.     ROS see history of present illness  Objective  Physical Exam Vitals:   10/17/22 1113  BP: 116/68  Pulse: (!) 45  Temp: 98 F (36.7 C)  SpO2: 95%    BP Readings from Last 3 Encounters:  10/17/22 116/68  08/19/22 (!) 122/53  08/19/22 106/66   Wt Readings from Last 3 Encounters:  10/17/22 196 lb 9.6 oz (89.2 kg)  08/19/22 197 lb 14.4 oz (89.8 kg)  07/30/22 203 lb 14.4 oz (92.5 kg)    Physical  Exam Constitutional:      General: She is not in acute distress.    Appearance: She is not diaphoretic.  Cardiovascular:     Rate and Rhythm: Normal rate. Rhythm irregularly irregular.     Heart sounds: Normal heart sounds.  Pulmonary:     Effort: Pulmonary effort is normal.     Breath sounds: Normal breath sounds.  Abdominal:     General: Bowel sounds are normal. There is no distension.     Palpations: Abdomen is soft. There is no mass.     Tenderness: There is abdominal tenderness. There is guarding. There is no rebound.  Genitourinary:    Comments: Prince Solian, CMA served as chaperone external genital warts noted, no external vaginal discharge noted, no internal exam completed Skin:    General: Skin is warm and dry.  Neurological:     Mental Status: She is alert.      Assessment/Plan: Please see individual problem list.  Pain of upper abdomen Assessment & Plan: Acute onset issue.  Undetermined etiology.  Discussed going to the emergency room for imaging and lab work given the significance of her pain and her exam though she declines this at this time.  I did discuss that she will likely end up in the ED for evaluation even after we start the work up. Will get a stat CT abdomen pelvis with contrast.  Will get stat labs as well.  Will check urinalysis given her urine symptoms.  She was advised to go to the emergency room immediately if she had any worsening symptoms or if she is not able to tolerate her current level of discomfort.  Orders: -     CT ABDOMEN PELVIS W CONTRAST; Future -     Lipase -     Comprehensive metabolic panel -     CBC with Differential/Platelet -     POCT urinalysis dipstick; Future  Black stool Assessment & Plan: Concerning for melena.  We are checking lab work today.  May need to consider holding her Eliquis pending her labs.   Vaginal discharge Assessment & Plan: Vaginal swab for BV and yeast infection completed.  Orders: -      Cervicovaginal ancillary only  Genital warts Assessment & Plan: Patient notes these are chronic issue.  She has seen a specialist previously.  They advised her she needed to see dermatology to consider removal if she wanted.    Return in about 3 weeks (around 11/07/2022).  I  have spent 31 minutes in the care of this patient regarding history taking, documentation, completion of exam, discussion of plan, placing orders.   Debra Alar, MD [facility]

## 2022-10-17 NOTE — Assessment & Plan Note (Signed)
Vaginal swab for BV and yeast infection completed.

## 2022-10-19 ENCOUNTER — Other Ambulatory Visit: Payer: Self-pay

## 2022-10-19 ENCOUNTER — Inpatient Hospital Stay
Admission: EM | Admit: 2022-10-19 | Discharge: 2022-10-26 | DRG: 392 | Disposition: A | Discharge status: Home Health | Payer: Medicare HMO | Source: Ambulatory Visit | Attending: Internal Medicine | Admitting: Internal Medicine

## 2022-10-19 DIAGNOSIS — I2510 Atherosclerotic heart disease of native coronary artery without angina pectoris: Secondary | ICD-10-CM | POA: Diagnosis present

## 2022-10-19 DIAGNOSIS — K219 Gastro-esophageal reflux disease without esophagitis: Secondary | ICD-10-CM | POA: Diagnosis present

## 2022-10-19 DIAGNOSIS — Z79899 Other long term (current) drug therapy: Secondary | ICD-10-CM

## 2022-10-19 DIAGNOSIS — K529 Noninfective gastroenteritis and colitis, unspecified: Secondary | ICD-10-CM | POA: Diagnosis present

## 2022-10-19 DIAGNOSIS — Z825 Family history of asthma and other chronic lower respiratory diseases: Secondary | ICD-10-CM

## 2022-10-19 DIAGNOSIS — J9611 Chronic respiratory failure with hypoxia: Secondary | ICD-10-CM | POA: Diagnosis present

## 2022-10-19 DIAGNOSIS — Z803 Family history of malignant neoplasm of breast: Secondary | ICD-10-CM

## 2022-10-19 DIAGNOSIS — R109 Unspecified abdominal pain: Secondary | ICD-10-CM | POA: Diagnosis present

## 2022-10-19 DIAGNOSIS — F32A Depression, unspecified: Secondary | ICD-10-CM | POA: Diagnosis present

## 2022-10-19 DIAGNOSIS — G4733 Obstructive sleep apnea (adult) (pediatric): Secondary | ICD-10-CM | POA: Diagnosis present

## 2022-10-19 DIAGNOSIS — Z87891 Personal history of nicotine dependence: Secondary | ICD-10-CM | POA: Diagnosis not present

## 2022-10-19 DIAGNOSIS — N1832 Chronic kidney disease, stage 3b: Secondary | ICD-10-CM | POA: Diagnosis present

## 2022-10-19 DIAGNOSIS — D126 Benign neoplasm of colon, unspecified: Secondary | ICD-10-CM | POA: Diagnosis not present

## 2022-10-19 DIAGNOSIS — I4819 Other persistent atrial fibrillation: Secondary | ICD-10-CM | POA: Diagnosis not present

## 2022-10-19 DIAGNOSIS — I890 Lymphedema, not elsewhere classified: Secondary | ICD-10-CM | POA: Diagnosis present

## 2022-10-19 DIAGNOSIS — R1013 Epigastric pain: Principal | ICD-10-CM | POA: Diagnosis present

## 2022-10-19 DIAGNOSIS — I2721 Secondary pulmonary arterial hypertension: Secondary | ICD-10-CM | POA: Diagnosis present

## 2022-10-19 DIAGNOSIS — E785 Hyperlipidemia, unspecified: Secondary | ICD-10-CM | POA: Diagnosis present

## 2022-10-19 DIAGNOSIS — Z9049 Acquired absence of other specified parts of digestive tract: Secondary | ICD-10-CM

## 2022-10-19 DIAGNOSIS — N179 Acute kidney failure, unspecified: Secondary | ICD-10-CM | POA: Diagnosis not present

## 2022-10-19 DIAGNOSIS — I130 Hypertensive heart and chronic kidney disease with heart failure and stage 1 through stage 4 chronic kidney disease, or unspecified chronic kidney disease: Secondary | ICD-10-CM | POA: Diagnosis present

## 2022-10-19 DIAGNOSIS — K635 Polyp of colon: Secondary | ICD-10-CM | POA: Diagnosis not present

## 2022-10-19 DIAGNOSIS — Z8719 Personal history of other diseases of the digestive system: Secondary | ICD-10-CM

## 2022-10-19 DIAGNOSIS — K2960 Other gastritis without bleeding: Secondary | ICD-10-CM | POA: Diagnosis not present

## 2022-10-19 DIAGNOSIS — E876 Hypokalemia: Secondary | ICD-10-CM | POA: Diagnosis present

## 2022-10-19 DIAGNOSIS — Z823 Family history of stroke: Secondary | ICD-10-CM

## 2022-10-19 DIAGNOSIS — Z6839 Body mass index (BMI) 39.0-39.9, adult: Secondary | ICD-10-CM

## 2022-10-19 DIAGNOSIS — R079 Chest pain, unspecified: Secondary | ICD-10-CM | POA: Diagnosis not present

## 2022-10-19 DIAGNOSIS — D122 Benign neoplasm of ascending colon: Secondary | ICD-10-CM | POA: Diagnosis present

## 2022-10-19 DIAGNOSIS — Z86718 Personal history of other venous thrombosis and embolism: Secondary | ICD-10-CM

## 2022-10-19 DIAGNOSIS — Z8249 Family history of ischemic heart disease and other diseases of the circulatory system: Secondary | ICD-10-CM

## 2022-10-19 DIAGNOSIS — Z792 Long term (current) use of antibiotics: Secondary | ICD-10-CM

## 2022-10-19 DIAGNOSIS — R001 Bradycardia, unspecified: Secondary | ICD-10-CM | POA: Diagnosis not present

## 2022-10-19 DIAGNOSIS — Z90710 Acquired absence of both cervix and uterus: Secondary | ICD-10-CM

## 2022-10-19 DIAGNOSIS — R0789 Other chest pain: Secondary | ICD-10-CM | POA: Diagnosis not present

## 2022-10-19 DIAGNOSIS — Z6838 Body mass index (BMI) 38.0-38.9, adult: Secondary | ICD-10-CM

## 2022-10-19 DIAGNOSIS — I5032 Chronic diastolic (congestive) heart failure: Secondary | ICD-10-CM | POA: Diagnosis present

## 2022-10-19 DIAGNOSIS — D120 Benign neoplasm of cecum: Secondary | ICD-10-CM | POA: Diagnosis present

## 2022-10-19 DIAGNOSIS — D631 Anemia in chronic kidney disease: Secondary | ICD-10-CM | POA: Diagnosis present

## 2022-10-19 DIAGNOSIS — R1010 Upper abdominal pain, unspecified: Secondary | ICD-10-CM | POA: Diagnosis not present

## 2022-10-19 DIAGNOSIS — K5730 Diverticulosis of large intestine without perforation or abscess without bleeding: Secondary | ICD-10-CM | POA: Diagnosis not present

## 2022-10-19 DIAGNOSIS — Z7901 Long term (current) use of anticoagulants: Secondary | ICD-10-CM

## 2022-10-19 DIAGNOSIS — I428 Other cardiomyopathies: Secondary | ICD-10-CM | POA: Diagnosis present

## 2022-10-19 DIAGNOSIS — K649 Unspecified hemorrhoids: Secondary | ICD-10-CM | POA: Diagnosis not present

## 2022-10-19 DIAGNOSIS — I9589 Other hypotension: Secondary | ICD-10-CM | POA: Diagnosis present

## 2022-10-19 DIAGNOSIS — E1122 Type 2 diabetes mellitus with diabetic chronic kidney disease: Secondary | ICD-10-CM | POA: Diagnosis present

## 2022-10-19 DIAGNOSIS — Z8619 Personal history of other infectious and parasitic diseases: Secondary | ICD-10-CM

## 2022-10-19 DIAGNOSIS — Z8673 Personal history of transient ischemic attack (TIA), and cerebral infarction without residual deficits: Secondary | ICD-10-CM

## 2022-10-19 DIAGNOSIS — D123 Benign neoplasm of transverse colon: Secondary | ICD-10-CM | POA: Diagnosis present

## 2022-10-19 DIAGNOSIS — Z8349 Family history of other endocrine, nutritional and metabolic diseases: Secondary | ICD-10-CM

## 2022-10-19 DIAGNOSIS — R1084 Generalized abdominal pain: Secondary | ICD-10-CM | POA: Diagnosis not present

## 2022-10-19 DIAGNOSIS — Z8679 Personal history of other diseases of the circulatory system: Secondary | ICD-10-CM

## 2022-10-19 DIAGNOSIS — J986 Disorders of diaphragm: Secondary | ICD-10-CM | POA: Diagnosis not present

## 2022-10-19 DIAGNOSIS — E669 Obesity, unspecified: Secondary | ICD-10-CM | POA: Diagnosis present

## 2022-10-19 DIAGNOSIS — Z886 Allergy status to analgesic agent status: Secondary | ICD-10-CM

## 2022-10-19 DIAGNOSIS — I272 Pulmonary hypertension, unspecified: Secondary | ICD-10-CM | POA: Diagnosis not present

## 2022-10-19 DIAGNOSIS — K648 Other hemorrhoids: Secondary | ICD-10-CM | POA: Diagnosis present

## 2022-10-19 DIAGNOSIS — I4821 Permanent atrial fibrillation: Secondary | ICD-10-CM | POA: Diagnosis present

## 2022-10-19 DIAGNOSIS — Z9981 Dependence on supplemental oxygen: Secondary | ICD-10-CM

## 2022-10-19 DIAGNOSIS — F419 Anxiety disorder, unspecified: Secondary | ICD-10-CM | POA: Diagnosis present

## 2022-10-19 DIAGNOSIS — J849 Interstitial pulmonary disease, unspecified: Secondary | ICD-10-CM | POA: Diagnosis present

## 2022-10-19 DIAGNOSIS — Z950 Presence of cardiac pacemaker: Secondary | ICD-10-CM

## 2022-10-19 DIAGNOSIS — Z8616 Personal history of COVID-19: Secondary | ICD-10-CM

## 2022-10-19 DIAGNOSIS — K644 Residual hemorrhoidal skin tags: Secondary | ICD-10-CM | POA: Diagnosis present

## 2022-10-19 DIAGNOSIS — R933 Abnormal findings on diagnostic imaging of other parts of digestive tract: Secondary | ICD-10-CM | POA: Diagnosis not present

## 2022-10-19 DIAGNOSIS — G8929 Other chronic pain: Secondary | ICD-10-CM | POA: Diagnosis present

## 2022-10-19 DIAGNOSIS — I10 Essential (primary) hypertension: Secondary | ICD-10-CM | POA: Diagnosis not present

## 2022-10-19 LAB — COMPREHENSIVE METABOLIC PANEL WITH GFR
ALT: 12 U/L (ref 0–44)
AST: 16 U/L (ref 15–41)
Albumin: 3.7 g/dL (ref 3.5–5.0)
Alkaline Phosphatase: 98 U/L (ref 38–126)
Anion gap: 9 (ref 5–15)
BUN: 24 mg/dL — ABNORMAL HIGH (ref 8–23)
CO2: 28 mmol/L (ref 22–32)
Calcium: 8.6 mg/dL — ABNORMAL LOW (ref 8.9–10.3)
Chloride: 100 mmol/L (ref 98–111)
Creatinine, Ser: 1.05 mg/dL — ABNORMAL HIGH (ref 0.44–1.00)
GFR, Estimated: 54 mL/min — ABNORMAL LOW
Glucose, Bld: 98 mg/dL (ref 70–99)
Potassium: 3 mmol/L — ABNORMAL LOW (ref 3.5–5.1)
Sodium: 137 mmol/L (ref 135–145)
Total Bilirubin: 0.6 mg/dL (ref 0.3–1.2)
Total Protein: 6.7 g/dL (ref 6.5–8.1)

## 2022-10-19 LAB — LIPASE, BLOOD: Lipase: 54 U/L — ABNORMAL HIGH (ref 11–51)

## 2022-10-19 LAB — CBC
HCT: 31.9 % — ABNORMAL LOW (ref 36.0–46.0)
Hemoglobin: 9.5 g/dL — ABNORMAL LOW (ref 12.0–15.0)
MCH: 27.4 pg (ref 26.0–34.0)
MCHC: 29.8 g/dL — ABNORMAL LOW (ref 30.0–36.0)
MCV: 91.9 fL (ref 80.0–100.0)
Platelets: 189 K/uL (ref 150–400)
RBC: 3.47 MIL/uL — ABNORMAL LOW (ref 3.87–5.11)
RDW: 12.3 % (ref 11.5–15.5)
WBC: 6.5 K/uL (ref 4.0–10.5)
nRBC: 0 % (ref 0.0–0.2)

## 2022-10-19 MED ORDER — DULOXETINE HCL 30 MG PO CPEP
60.0000 mg | ORAL_CAPSULE | Freq: Every day | ORAL | Status: DC
  Administered 2022-10-20 – 2022-10-26 (×7): 60 mg via ORAL
  Filled 2022-10-19 (×5): qty 2
  Filled 2022-10-19: qty 1
  Filled 2022-10-19: qty 2

## 2022-10-19 MED ORDER — OXYCODONE HCL 5 MG PO TABS
5.0000 mg | ORAL_TABLET | ORAL | Status: DC | PRN
  Filled 2022-10-19 (×2): qty 1

## 2022-10-19 MED ORDER — POTASSIUM CHLORIDE 2 MEQ/ML IV SOLN
INTRAVENOUS | Status: DC
  Filled 2022-10-19: qty 1000

## 2022-10-19 MED ORDER — ACETAMINOPHEN 500 MG PO TABS
500.0000 mg | ORAL_TABLET | Freq: Four times a day (QID) | ORAL | Status: DC | PRN
  Administered 2022-10-20 – 2022-10-21 (×2): 500 mg via ORAL
  Filled 2022-10-19 (×2): qty 1

## 2022-10-19 MED ORDER — POLYETHYLENE GLYCOL 3350 17 G PO PACK: 17.0000 g | PACK | Freq: Every day | ORAL | Status: DC | PRN

## 2022-10-19 MED ORDER — CEFADROXIL 500 MG PO CAPS
500.0000 mg | ORAL_CAPSULE | Freq: Two times a day (BID) | ORAL | Status: DC
  Administered 2022-10-20 – 2022-10-26 (×14): 500 mg via ORAL
  Filled 2022-10-19 (×16): qty 1

## 2022-10-19 MED ORDER — PANTOPRAZOLE SODIUM 40 MG IV SOLR
40.0000 mg | Freq: Two times a day (BID) | INTRAVENOUS | Status: DC
  Administered 2022-10-20 – 2022-10-24 (×9): 40 mg via INTRAVENOUS
  Filled 2022-10-19 (×9): qty 10

## 2022-10-19 MED ORDER — MELATONIN 5 MG PO TABS
5.0000 mg | ORAL_TABLET | Freq: Every evening | ORAL | Status: DC | PRN
  Administered 2022-10-20: 5 mg via ORAL
  Filled 2022-10-19: qty 1

## 2022-10-19 MED ORDER — PROCHLORPERAZINE EDISYLATE 10 MG/2ML IJ SOLN
5.0000 mg | Freq: Four times a day (QID) | INTRAMUSCULAR | Status: DC | PRN
  Administered 2022-10-26: 5 mg via INTRAVENOUS
  Filled 2022-10-19 (×2): qty 1

## 2022-10-19 MED ORDER — HYDROMORPHONE HCL 1 MG/ML IJ SOLN
0.5000 mg | INTRAMUSCULAR | Status: DC | PRN
  Administered 2022-10-20 – 2022-10-23 (×7): 0.5 mg via INTRAVENOUS
  Filled 2022-10-19 (×2): qty 1
  Filled 2022-10-19 (×2): qty 0.5
  Filled 2022-10-19 (×3): qty 1

## 2022-10-19 MED ORDER — MACITENTAN 10 MG PO TABS: 10.0000 mg | ORAL_TABLET | Freq: Every day | ORAL | Status: DC

## 2022-10-19 MED ORDER — SELEXIPAG 1600 MCG PO TABS
1600.0000 ug | ORAL_TABLET | Freq: Two times a day (BID) | ORAL | Status: DC
  Administered 2022-10-20 – 2022-10-26 (×12): 1600 ug via ORAL
  Filled 2022-10-19 (×12): qty 1

## 2022-10-19 MED ORDER — PANTOPRAZOLE SODIUM 40 MG IV SOLR
40.0000 mg | Freq: Once | INTRAVENOUS | Status: AC
  Administered 2022-10-19: 40 mg via INTRAVENOUS
  Filled 2022-10-19: qty 10

## 2022-10-19 NOTE — ED Provider Notes (Signed)
[facility] Provider Note    Event Date/Time   First MD Initiated Contact with Patient 10/19/22 2150     (approximate)   History   Abdominal Pain   HPI  Debra Saunders is a 77 y.o. female who has had some generalized abdominal pain for 2 to 3 weeks went to a PCP with CT that was concerning so sent here for evaluation.  Patient has a history of A-fib which she is on Eliquis for.  Patient reports her last dose was this morning.  She reports intermittent upper abdominal pain worse after eating.  States is difficult and that the pain is pretty severe.  She reports is a 10 out of 10.  IMPRESSION: 1. Possible short-segment intussusception or ulceration in the region of the pylorus. Recommend correlation with endoscopy. 2. Subcentimeter cystic lesions in the pancreatic body and uncinate process, not definitely seen on prior studies. Recommend follow-up MRI/MRCP with and without contrast in 2 years. 3. Colonic diverticulosis without evidence of acute diverticulitis. 4. Bilateral adnexal cysts, increased in size since 2023. Recommend follow-up pelvic ultrasound in 6-12 months.   She reports she is status post cholecystectomy, appendectomy, and hysterectomy.   Physical Exam   Triage Vital Signs: ED Triage Vitals  Enc Vitals Group     BP 10/19/22 1843 119/67     Pulse Rate 10/19/22 1844 74     Resp 10/19/22 1843 20     Temp 10/19/22 1843 98.7 F (37.1 C)     Temp src --      SpO2 10/19/22 1843 98 %     Weight 10/19/22 1843 196 lb (88.9 kg)     Height 10/19/22 1843 5' (1.524 m)     Head Circumference --      Peak Flow --      Pain Score 10/19/22 1843 10     Pain Loc --      Pain Edu? --      Excl. in GC? --     Most recent vital signs: Vitals:   10/19/22 1843 10/19/22 1844  BP: 119/67   Pulse:  74  Resp: 20   Temp: 98.7 F (37.1 C)   SpO2: 98%      General: Awake, no distress.  CV:  Good peripheral perfusion.  Resp:  Normal effort.   Abd:  No distention.  Some mild upper abdominal tenderness.  No lower abdominal tenderness. Other:     ED Results / Procedures / Treatments   Labs (all labs ordered are listed, but only abnormal results are displayed) Labs Reviewed  LIPASE, BLOOD - Abnormal; Notable for the following components:      Result Value   Lipase 54 (*)    All other components within normal limits  COMPREHENSIVE METABOLIC PANEL - Abnormal; Notable for the following components:   Potassium 3.0 (*)    BUN 24 (*)    Creatinine, Ser 1.05 (*)    Calcium 8.6 (*)    GFR, Estimated 54 (*)    All other components within normal limits  CBC - Abnormal; Notable for the following components:   RBC 3.47 (*)    Hemoglobin 9.5 (*)    HCT 31.9 (*)    MCHC 29.8 (*)    All other components within normal limits  URINALYSIS, ROUTINE W REFLEX MICROSCOPIC     EKG  My interpretation of EKG:  Ventricular paced rhythm rate of 69 without any ST elevation, T wave inversion in aVL,  widened QRS secondary to pacing  RADIOLOGY I have reviewed the CT personally and interpreted from 4/26 without evidence of kidney stones   PROCEDURES:  Critical Care performed: No  Procedures   MEDICATIONS ORDERED IN ED: Medications - No data to display   IMPRESSION / MDM / ASSESSMENT AND PLAN / ED COURSE  I reviewed the triage vital signs and the nursing notes.   Patient's presentation is most consistent with acute presentation with potential threat to life or bodily function.  Patient comes in with abnormal CT imaging concerning for intussusception, gastric ulcer.  Did discuss with the radiologist Dr. Jake Samples who did not really feel like it looked like intussusception given this would be very abnormal in the stomach but looks more like an intraluminal mass versus pyloric thickening there is no evidence of any obstruction.  I discussed the case with Dr. Timothy Lasso who we discussed outpatient follow-up for outpatient endoscopy versus  admission.  Discussed with family and they prefer to be admitted given how significant her symptoms are.  Will start on IV PPI recommended liquid diet.  He would not build to scope her till Tuesday after blood thinners been held for 48 hours.  CBC shows hemoglobin around baseline.  Normal white count.  Lipase slightly elevated.  CMP shows stable creatinine.    The patient is on the cardiac monitor to evaluate for evidence of arrhythmia and/or significant heart rate changes.      FINAL CLINICAL IMPRESSION(S) / ED DIAGNOSES   Final diagnoses:  Abdominal pain, unspecified abdominal location     Rx / DC Orders   ED Discharge Orders     None        Note:  This document was prepared using Dragon voice recognition software and may include unintentional dictation errors.   Concha Se, MD 10/19/22 [phone]

## 2022-10-19 NOTE — ED Triage Notes (Signed)
Pt to ED POV for generalized abd pain for 2-3 weeks. Went to PCP and has CT resulted, was sent to ED.   Pt verbalizes she does not have legal guardian, daughter is POA. Daughter brought pt to ED.

## 2022-10-19 NOTE — H&P (Signed)
History and Physical  Debra Saunders:[id number] DOB: 1946/01/31 DOA: 10/19/2022  Referring physician: Dr. Elias Plan, EDP  PCP: Glori Luis, MD  Outpatient Specialists: Cardiology, oncology, OB/GYN. Patient coming from: Home  Chief Complaint: Abdominal pain.  HPI: Debra Saunders is a 77 y.o. female with medical history significant for type 2 diabetes, obesity, OSA on CPAP, essential hypertension, anemia of chronic disease, CKD 3B, permanent atrial fibrillation on Eliquis, chronic diastolic CHF, GERD, who presented to [facility] ED from home with complaints of postprandial upper abdominal pain of 2 weeks duration.  Worse after eating.  The patient saw her primary care provider for the same 2 days ago for which a CT scan was ordered.  It resulted today with abnormal findings.  The patient was advised to go to the ED for further evaluation.  In the ED, CT abdomen pelvis with contrast done on 10/17/2022 was reviewed.  It revealed the following findings: 1. Possible short-segment intussusception or ulceration in the region of the pylorus. Recommend correlation with endoscopy. 2. Subcentimeter cystic lesions in the pancreatic body and uncinate process, not definitely seen on prior studies. Recommend follow-up MRI/MRCP with and without contrast in 2 years. 3. Colonic diverticulosis without evidence of acute diverticulitis. 4. Bilateral adnexal cysts, increased in size since 2023. Recommend follow-up pelvic ultrasound in 6-12 months.  On physical exam, tenderness noted in upper abdomen.  The patient was started on IV Protonix 40 mg twice daily.  EDP discussed the case with GI who recommended holding off on Eliquis for possible EGD on Tuesday, 10/21/2022.  The patient was admitted by [facility], hospitalist service to progressive care unit as inpatient status.   ED Course: Tmax 98.7.  BP 124/49, pulse 40, respiratory 20, O2 saturation 98% on 4 L lab studies remarkable for hemoglobin 9.5 from 10.1, WBC  6.5, platelet count 189.  Serum potassium 3.0, BUN 24, creatinine 1.05 from 1.30.  Lipase 54.  Review of Systems: Review of systems as noted in the HPI. All other systems reviewed and are negative.   Past Medical History:  Diagnosis Date   Arthritis    Atrial fibrillation, persistent (HCC)    a. s/p TEE-guided DCCV on 08/20/2017; b. 06/2021 Recurrent Afib-->amio/TEE/DCCV.   CAD (coronary artery disease)    a. 01/2012 Cath: nonobs dzs; b. 04/2014 MV: No ischemia; c. 09/2017 Cath: Minimal nonobs RCA dzs, otw nl cors.   Cardiorenal syndrome    a. 09/2017 Creat rose to 2.7 w/ diuresis-->HD x 2.   Chronic back pain    Chronic combined systolic (congestive) and diastolic (congestive) heart failure (HCC)    a. Dx 2005 @ [facility];  b. 02/2014 Echo: EF 55-60%; c. 07/2017: EF 30-35%; d. 09/2017 Echo: EF 40-45%; e. 02/2018 Echo: EF 55-60%; f. 08/2019 Echo: EF 60-65%; g. 08/2020 RHC: RA 3, RV 30/1, PA 33/11 (20), PCWP 6; f. 08/2021 Echo: EF 60-65%, no rwma, GrIDD, nl RV fxn, RVSP 40.56mmHg, mod dil LA, mod TR.   Chronic respiratory failure (HCC)    a. on home O2   Gastritis    History of intracranial hemorrhage    a. 6/14 described by neurosurgery as "small frontal posttraumatic SAH " - pt was on xarelto @ the time.   Hyperlipidemia    Hypertension    Interstitial lung disease (HCC)    Lipoma of back    Lymphedema    a. Chronic LLE edema.   NICM (nonischemic cardiomyopathy) (HCC)    a. 09/2017 Echo: EF 40-45%; b. 09/2017 Cath:  minor, insignificant CAD; c. 02/2018 Echo: EF 55-60%, Gr1 DD; d. 08/2019 Echo: EF 60-65%, Gr2 DD.   Obesity    PAH (pulmonary artery hypertension) (HCC)    a. 09/2017 Echo: PASP ; b. 09/2017 RHC: RA 23, RV 84/13, PA 84/29, PCWP 20, CO 3.89, CI 1.89. LVEDP 18; c. 08/2020 RHC: RA 3, RV 30/1, PA 33/11 (20), PCWP 6. CO/CI 6.18/3.36. PVR 2.3 WU.   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 06/2021. Veg on PPM lead. Not candidate for  extraction-->s/p 6 wks abx.   Sepsis with metabolic encephalopathy (HCC)    Sleep apnea    Streptococcal bacteremia    Stroke [facility]) 2014   Subarachnoid hemorrhage (HCC) 01/19/2013   Symptomatic bradycardia    a. s/p BSX PPM.   Thyroid nodule    Type II diabetes mellitus (HCC)    Urinary incontinence    Past Surgical History:  Procedure Laterality Date   ABDOMINAL HYSTERECTOMY  1969   BREAST BIOPSY Right 10/28/2019   lymph node bx, [place] Bx, pending path    CARDIAC CATHETERIZATION  2013   @ [facility]: No obstructive CAD: Only 20% ostial left circumflex.   CARDIOVERSION N/A 10/12/2017   Procedure: CARDIOVERSION;  Surgeon: Antonieta Iba, MD;  Location: [facility] ORS;  Service: Cardiovascular;  Laterality: N/A;   CARDIOVERSION N/A 07/04/2021   Procedure: CARDIOVERSION;  Surgeon: Laurey Morale, MD;  Location: [facility] ENDOSCOPY;  Service: Cardiovascular;  Laterality: N/A;   CATARACT EXTRACTION W/PHACO Right 09/19/2020   Procedure: CATARACT EXTRACTION PHACO AND INTRAOCULAR LENS PLACEMENT (IOC) RIGHT DIABETIC 3.95 00:43.0 9.2%;  Surgeon: Lockie Mola, MD;  Location: [facility] SURGERY CNTR;  Service: Ophthalmology;  Laterality: Right;  Pt requests to be last sleep apnea   CATARACT EXTRACTION W/PHACO Left 10/03/2020   Procedure: CATARACT EXTRACTION PHACO AND INTRAOCULAR LENS PLACEMENT (IOC) LEFT DIABETIC;  Surgeon: Lockie Mola, MD;  Location: [facility] SURGERY CNTR;  Service: Ophthalmology;  Laterality: Left;  3.25 0:59.3 5.5%   CHOLECYSTECTOMY     COLONOSCOPY WITH PROPOFOL N/A 09/10/2018   Procedure: COLONOSCOPY WITH PROPOFOL;  Surgeon: Scot Jun, MD;  Location: [facility] ENDOSCOPY;  Service: Endoscopy;  Laterality: N/A;   CYST REMOVAL TRUNK  2002   BACK   DIALYSIS/PERMA CATHETER INSERTION N/A 10/06/2017   Procedure: DIALYSIS/PERMA CATHETER INSERTION;  Surgeon: Renford Dills, MD;  Location: [facility] INVASIVE CV LAB;  Service: Cardiovascular;  Laterality: N/A;   INSERT / REPLACE / REMOVE  PACEMAKER     lipoma removal  2014   back   PACEMAKER INSERTION  2015   [employer] dual chamber pacemaker implanted by Dr Graciela Husbands for symptomatic bradycardia   PERMANENT PACEMAKER INSERTION N/A 01/09/2014   Procedure: PERMANENT PACEMAKER INSERTION;  Surgeon: [facility] Salvia, MD;  Location: [facility] CATH LAB;  Service: Cardiovascular;  Laterality: N/A;   RIGHT HEART CATH N/A 04/19/2018   Procedure: RIGHT HEART CATH;  Surgeon: Laurey Morale, MD;  Location: [name] INVASIVE CV LAB;  Service: Cardiovascular;  Laterality: N/A;   RIGHT HEART CATH N/A 08/30/2020   Procedure: RIGHT HEART CATH;  Surgeon: Laurey Morale, MD;  Location: [facility] INVASIVE CV LAB;  Service: Cardiovascular;  Laterality: N/A;   RIGHT HEART CATH N/A 02/11/2022   Procedure: RIGHT HEART CATH;  Surgeon: Laurey Morale, MD;  Location: [facility] INVASIVE CV LAB;  Service: Cardiovascular;  Laterality: N/A;   RIGHT/LEFT HEART CATH AND CORONARY ANGIOGRAPHY N/A 10/19/2017   Procedure: RIGHT/LEFT HEART CATH AND  CORONARY ANGIOGRAPHY;  Surgeon: Iran Ouch, MD;  Location: [facility] INVASIVE CV LAB;  Service: Cardiovascular;  Laterality: N/A;   TEE WITHOUT CARDIOVERSION N/A 08/20/2017   Procedure: TRANSESOPHAGEAL ECHOCARDIOGRAM (TEE) & Direct current cardioversion;  Surgeon: Antonieta Iba, MD;  Location: [facility] ORS;  Service: Cardiovascular;  Laterality: N/A;   TEE WITHOUT CARDIOVERSION N/A 06/28/2021   Procedure: TRANSESOPHAGEAL ECHOCARDIOGRAM (TEE);  Surgeon: Antonieta Iba, MD;  Location: [facility] ORS;  Service: Cardiovascular;  Laterality: N/A;   TEE WITHOUT CARDIOVERSION N/A 07/04/2021   Procedure: TRANSESOPHAGEAL ECHOCARDIOGRAM (TEE);  Surgeon: Laurey Morale, MD;  Location: [facility] ENDOSCOPY;  Service: Cardiovascular;  Laterality: N/A;   TRIGGER FINGER RELEASE      Social History:  reports that she quit smoking about 32 years ago. Her smoking use included cigarettes. She has a 0.60 pack-year smoking history. She has never used smokeless tobacco. She  reports that she does not drink alcohol and does not use drugs.   Allergies  Allergen Reactions   Aspirin Anaphylaxis and Hives    Pt states that she is unable to take because she had bleeding in her brain   Other Other (See Comments)    Pt states that she is unable to take blood thinners because she had bleeding in her brain; Blood Thinners-per doctor at [facility] (Xarelto)    Family History  Problem Relation Age of Onset   Heart disease Mother    Hypertension Mother    COPD Mother        was a smoker   Thyroid disease Mother    Hypertension Father    Hypertension Sister    Thyroid disease Sister    Breast cancer Sister    Hypertension Sister    Thyroid disease Sister    Bone cancer Sister    Stroke Son    Breast cancer Maternal Aunt    Kidney disease Neg Hx    Bladder Cancer Neg Hx       Prior to Admission medications   Medication Sig Start Date End Date Taking? Authorizing Provider  acetaminophen (TYLENOL) 325 MG tablet Take 325-650 mg by mouth every 6 (six) hours as needed for mild pain.   Yes [provider]  albuterol (ACCUNEB) 0.63 MG/3ML nebulizer solution Take 1 ampule by nebulization every 6 (six) hours as needed for wheezing.   Yes [provider]  apixaban (ELIQUIS) 5 MG TABS tablet Take 1 tablet (5 mg total) by mouth 2 (two) times daily. 10/14/22  Yes Laurey Morale, MD  atropine 1 % ophthalmic solution Place 1 drop into both eyes at bedtime. 10/09/22  Yes [provider]  cefadroxil (DURICEF) 500 MG capsule Take 1 capsule (500 mg total) by mouth 2 (two) times daily. 11/26/21 11/21/22 Yes Vu, Tonita [place], MD  Difluprednate 0.05 % EMUL Place 1 drop into both eyes 3 (three) times daily.   Yes [provider]  DULoxetine (CYMBALTA) 60 MG capsule Take 60 mg by mouth daily.   Yes [provider]  gabapentin (NEURONTIN) 300 MG capsule Take 2 capsules (600 mg total) by mouth 3 (three) times daily. Patient taking differently: Take  300 mg by mouth 2 (two) times daily. once a day 09/10/21 01/29/23 Yes Glori Luis, MD  macitentan (OPSUMIT) 10 MG tablet Take 1 tablet (10 mg total) by mouth daily. 06/18/22  Yes Laurey Morale, MD  meclizine (ANTIVERT) 25 MG tablet Take 1 tablet (25 mg total) by mouth 3 (three) times daily as needed  for dizziness. 01/15/22  Yes Laurey Morale, MD  Menthol, Topical Analgesic, (BIOFREEZE EX) Apply 1 application topically 4 (four) times daily as needed (for pain).   Yes [provider]  metolazone (ZAROXOLYN) 2.5 MG tablet Take 2.5 mg by mouth every Monday, Wednesday, and Friday.   Yes [provider]  midodrine (PROAMATINE) 5 MG tablet Take one tablet at 12 noon, 4pm and 8pm 07/15/22  Yes [facility] Salvia, MD  potassium chloride SA (KLOR-CON M) 20 MEQ tablet Take 2 tablets (40 mEq total) by mouth daily. 04/25/22  Yes [place], Anderson Malta, FNP  rosuvastatin (CRESTOR) 10 MG tablet Take 1 tablet (10 mg total) by mouth daily. 11/13/21  Yes Laurey Morale, MD  Selexipag (UPTRAVI) 1600 MCG TABS Take 1 tablet (1,600 mcg total) by mouth 2 (two) times daily. 07/30/21  Yes Laurey Morale, MD  sildenafil (REVATIO) 20 MG tablet Take 1 tablet (20 mg total) by mouth 3 (three) times daily. 10/14/22  Yes Laurey Morale, MD  spironolactone (ALDACTONE) 25 MG tablet Take 1 tablet (25 mg total) by mouth daily. She has been taking a whole pill 09/03/22  Yes Laurey Morale, MD  torsemide (DEMADEX) 20 MG tablet Take 3 tablets (60 mg total) by mouth 2 (two) times daily. Patient takes 3 tablets by mouth twice a day. Patient taking differently: Take 40 mg by mouth 2 (two) times daily. Been taking 40mg  twice a day 12/20/21  Yes [place], [place], FNP  doxycycline (ADOXA) 100 MG tablet Take 1 tablet (100 mg total) by mouth 2 (two) times daily. Patient not taking: Reported on 10/19/2022 04/07/22   Jacklynn Ganong, FNP    Physical Exam: BP 119/67   Pulse 74   Temp 98.7 F (37.1 C)   Resp 20   Ht 5'  (1.524 m)   Wt 88.9 kg   SpO2 98%   BMI 38.28 kg/m   General: 77 y.o. year-old female well developed well nourished in no acute distress.  Alert and oriented x3. Cardiovascular: Irregular rate and rhythm with no rubs or gallops.  No thyromegaly or JVD noted.  No lower extremity edema. 2/4 pulses in all 4 extremities. Respiratory: Clear to auscultation with no wheezes or rales. Good inspiratory effort. Abdomen: Soft.  Tenderness at epigastric region.  Nondistended with normal bowel sounds x4 quadrants. Muskuloskeletal: No cyanosis or clubbing noted bilaterally Neuro: CN II-XII intact, strength, sensation, reflexes Skin: No ulcerative lesions noted or rashes Psychiatry: Judgement and insight appear normal. Mood is appropriate for condition and setting          Labs on Admission:  Basic Metabolic Panel: Recent Labs  Lab 10/17/22 1139 10/17/22 1218 10/19/22 1847  NA 140  --  137  K 3.3*  --  3.0*  CL 98  --  100  CO2 33*  --  28  GLUCOSE 125*  --  98  BUN 26*  --  24*  CREATININE 1.14 1.30* 1.05*  CALCIUM 9.0  --  8.6*   Liver Function Tests: Recent Labs  Lab 10/17/22 1139 10/19/22 1847  AST 13 16  ALT 9 12  ALKPHOS 114 98  BILITOT 0.4 0.6  PROT 7.3 6.7  ALBUMIN 4.2 3.7   Recent Labs  Lab 10/17/22 1139 10/19/22 1847  LIPASE 153.0* 54*   No results for input(s): "AMMONIA" in the last 168 hours. CBC: Recent Labs  Lab 10/17/22 1139 10/19/22 1847  WBC 6.4 6.5  NEUTROABS 4.2  --  HGB 10.1* 9.5*  HCT 32.1* 31.9*  MCV 88.6 91.9  PLT 187.0 189   Cardiac Enzymes: No results for input(s): "CKTOTAL", "CKMB", "CKMBINDEX", "TROPONINI" in the last 168 hours.  BNP (last 3 results) Recent Labs    10/23/21 1248 12/20/21 1254 04/22/22 1424  BNP 66.8 157.5* 57.8    ProBNP (last 3 results) No results for input(s): "PROBNP" in the last 8760 hours.  CBG: No results for input(s): "GLUCAP" in the last 168 hours.  Radiological Exams on Admission: No results  found.  EKG: I independently viewed the EKG done and my findings are as followed: Ventricular paced rhythm with frequent AV dual paced complexes and occasional PVCs.  Rate of 69.  QTc 454.  Assessment/Plan Present on Admission:  Abdominal pain  Principal Problem:   Abdominal pain  Abdominal pain secondary to possible ulceration of the pylorus seen on CT scan Continue IV Protonix started in the ED As needed analgesics As needed antiemetics GI consulted by EDP Hold off Eliquis as recommended by GI.  Permanent atrial fibrillation on Eliquis Hold off on Eliquis as recommended by GI Rate is currently rate controlled. Closely monitor on telemetry.  Chronic diastolic CHF Euvolemic on exam Last 2D echo done on 08/23/2021 revealed LVEF 6065% and grade 1 diastolic dysfunction. Hold off home oral antihypertensives to avoid hypotension. Monitor strict I's and O's and daily weight while on gentle IV fluid hydration.  Hypokalemia Serum potassium 3.0 Repleted intravenously with LR KCl 40 mill equivalent at 50 cc/h x 2 days. Obtain magnesium level in the morning Replete electrolytes as indicated  Obesity BMI 38 Recommend weight loss outpatient with regular physical activity and healthy dieting.  OSA CPAP nightly  Chronic hypotension Resume home midodrine Maintain MAP greater than 65 Closely monitor vital signs.   DVT prophylaxis: SCDs  Code Status: Full code  Family Communication: Updated the patient's daughter at bedside  Disposition Plan: Admitted to progressive care unit  Consults called: GI consulted by EDP.  Admission status: Inpatient status.   Status is: Inpatient The patient requires at least 2 midnights for further evaluation and treatment of present condition.   Darlin Drop MD [facility] Pager [phone]  If 7PM-7AM, please contact night-coverage [email or web address] Password [email or web address]  10/19/2022, 11:20 PM

## 2022-10-20 ENCOUNTER — Encounter: Payer: Self-pay | Admitting: Internal Medicine

## 2022-10-20 ENCOUNTER — Other Ambulatory Visit: Payer: Self-pay | Admitting: Family Medicine

## 2022-10-20 ENCOUNTER — Other Ambulatory Visit: Payer: Self-pay

## 2022-10-20 ENCOUNTER — Ambulatory Visit: Payer: Medicare HMO | Admitting: Family Medicine

## 2022-10-20 DIAGNOSIS — I4821 Permanent atrial fibrillation: Secondary | ICD-10-CM | POA: Diagnosis present

## 2022-10-20 DIAGNOSIS — R1010 Upper abdominal pain, unspecified: Secondary | ICD-10-CM | POA: Diagnosis not present

## 2022-10-20 DIAGNOSIS — I272 Pulmonary hypertension, unspecified: Secondary | ICD-10-CM | POA: Diagnosis not present

## 2022-10-20 DIAGNOSIS — I5032 Chronic diastolic (congestive) heart failure: Secondary | ICD-10-CM | POA: Diagnosis not present

## 2022-10-20 LAB — CERVICOVAGINAL ANCILLARY ONLY
Bacterial Vaginitis (gardnerella): NEGATIVE
Candida Glabrata: NEGATIVE
Candida Vaginitis: POSITIVE — AB
Comment: NEGATIVE
Comment: NEGATIVE
Comment: NEGATIVE

## 2022-10-20 LAB — CBC
HCT: 30.4 % — ABNORMAL LOW (ref 36.0–46.0)
Hemoglobin: 9.1 g/dL — ABNORMAL LOW (ref 12.0–15.0)
MCH: 27.7 pg (ref 26.0–34.0)
MCHC: 29.9 g/dL — ABNORMAL LOW (ref 30.0–36.0)
MCV: 92.7 fL (ref 80.0–100.0)
Platelets: 168 10*3/uL (ref 150–400)
RBC: 3.28 MIL/uL — ABNORMAL LOW (ref 3.87–5.11)
RDW: 12.2 % (ref 11.5–15.5)
WBC: 6.6 10*3/uL (ref 4.0–10.5)
nRBC: 0 % (ref 0.0–0.2)

## 2022-10-20 LAB — URINALYSIS, ROUTINE W REFLEX MICROSCOPIC
Bacteria, UA: NONE SEEN
Bilirubin Urine: NEGATIVE
Glucose, UA: NEGATIVE mg/dL
Ketones, ur: NEGATIVE mg/dL
Leukocytes,Ua: NEGATIVE
Nitrite: NEGATIVE
Protein, ur: NEGATIVE mg/dL
Specific Gravity, Urine: 1.009 (ref 1.005–1.030)
pH: 5 (ref 5.0–8.0)

## 2022-10-20 LAB — COMPREHENSIVE METABOLIC PANEL
ALT: 12 U/L (ref 0–44)
AST: 14 U/L — ABNORMAL LOW (ref 15–41)
Albumin: 3.8 g/dL (ref 3.5–5.0)
Alkaline Phosphatase: 98 U/L (ref 38–126)
Anion gap: 8 (ref 5–15)
BUN: 23 mg/dL (ref 8–23)
CO2: 32 mmol/L (ref 22–32)
Calcium: 8.7 mg/dL — ABNORMAL LOW (ref 8.9–10.3)
Chloride: 101 mmol/L (ref 98–111)
Creatinine, Ser: 0.96 mg/dL (ref 0.44–1.00)
GFR, Estimated: >60 mL/min (ref >60)
Glucose, Bld: 107 mg/dL — ABNORMAL HIGH (ref 70–99)
Potassium: 2.9 mmol/L — ABNORMAL LOW (ref 3.5–5.1)
Sodium: 141 mmol/L (ref 135–145)
Total Bilirubin: 0.8 mg/dL (ref 0.3–1.2)
Total Protein: 6.6 g/dL (ref 6.5–8.1)

## 2022-10-20 LAB — APTT
aPTT: 29 seconds (ref 24–36)
aPTT: 78 seconds — ABNORMAL HIGH (ref 24–36)
aPTT: 79 seconds — ABNORMAL HIGH (ref 24–36)

## 2022-10-20 LAB — PHOSPHORUS: Phosphorus: 3.3 mg/dL (ref 2.5–4.6)

## 2022-10-20 LAB — PROTIME-INR
INR: 1.2 (ref 0.8–1.2)
Prothrombin Time: 14.6 seconds (ref 11.4–15.2)

## 2022-10-20 LAB — MAGNESIUM: Magnesium: 1.9 mg/dL (ref 1.7–2.4)

## 2022-10-20 LAB — HEPARIN LEVEL (UNFRACTIONATED): Heparin Unfractionated: >1.1 IU/mL — ABNORMAL HIGH (ref 0.30–0.70)

## 2022-10-20 MED ORDER — HEPARIN BOLUS VIA INFUSION
4000.0000 [IU] | Freq: Once | INTRAVENOUS | Status: AC
  Administered 2022-10-20: 4000 [IU] via INTRAVENOUS
  Filled 2022-10-20: qty 4000

## 2022-10-20 MED ORDER — TORSEMIDE 20 MG PO TABS
40.0000 mg | ORAL_TABLET | Freq: Two times a day (BID) | ORAL | Status: DC
  Administered 2022-10-20 – 2022-10-22 (×6): 40 mg via ORAL
  Filled 2022-10-20 (×6): qty 2

## 2022-10-20 MED ORDER — ORAL CARE MOUTH RINSE: 15.0000 mL | OROMUCOSAL | Status: DC | PRN

## 2022-10-20 MED ORDER — MACITENTAN 10 MG PO TABS
10.0000 mg | ORAL_TABLET | Freq: Every day | ORAL | Status: DC
  Filled 2022-10-20: qty 1

## 2022-10-20 MED ORDER — MACITENTAN 10 MG PO TABS
10.0000 mg | ORAL_TABLET | Freq: Every day | ORAL | Status: DC
  Administered 2022-10-20 – 2022-10-26 (×7): 10 mg via ORAL
  Filled 2022-10-20 (×8): qty 1

## 2022-10-20 MED ORDER — SODIUM CHLORIDE 0.9 % IV SOLN: INTRAVENOUS | Status: DC

## 2022-10-20 MED ORDER — HEPARIN (PORCINE) 25000 UT/250ML-% IV SOLN
1000.0000 [IU]/h | INTRAVENOUS | Status: AC
  Administered 2022-10-20 (×2): 1000 [IU]/h via INTRAVENOUS
  Filled 2022-10-20 (×2): qty 250

## 2022-10-20 MED ORDER — POTASSIUM CHLORIDE CRYS ER 20 MEQ PO TBCR
40.0000 meq | EXTENDED_RELEASE_TABLET | ORAL | Status: AC
  Administered 2022-10-20 (×3): 40 meq via ORAL
  Filled 2022-10-20 (×3): qty 2

## 2022-10-20 MED ORDER — MIDODRINE HCL 5 MG PO TABS
5.0000 mg | ORAL_TABLET | Freq: Two times a day (BID) | ORAL | Status: DC
  Administered 2022-10-20 – 2022-10-26 (×12): 5 mg via ORAL
  Filled 2022-10-20 (×12): qty 1

## 2022-10-20 MED ORDER — FLUCONAZOLE 150 MG PO TABS: 150.0000 mg | ORAL_TABLET | ORAL | 0 refills | Status: DC

## 2022-10-20 MED ORDER — GABAPENTIN 300 MG PO CAPS
300.0000 mg | ORAL_CAPSULE | Freq: Every day | ORAL | Status: DC
  Administered 2022-10-20 – 2022-10-23 (×4): 300 mg via ORAL
  Filled 2022-10-20 (×4): qty 1

## 2022-10-20 NOTE — Progress Notes (Signed)
Monitoring by Pharmacy for Pulmonary Hypertension Treatment   Indication - Continuation of prior to admission medication   Patient is 77 y.o.  with history of PAH on chronic macitentan (OPSUMIT) PTA and will be continued while hospitalized.   Continuing this medication order as an inpatient requires that monitoring parameters per REMS requirements must be met.  Chronic therapy is under the supervision of Marca Ancona (?)  who is enrolled in the REMS program and is being notified of continuation of therapy. A staff message in EPIC has been sent notifying the certified prescriber.  Per patient report has previously been educated on Pregnancy risk and Hepatotoxicity. On admission pregnancy risk has been assessed and no monitoring required, and hysterectomy has been confirmed as birth control option for this patient.  Hepatic function has been evaluated. AST / ALT appropriate to continue medication at this time.     Latest Ref Rng & Units 10/19/2022    6:47 PM 10/17/2022   11:39 AM 01/28/2022    3:40 PM  Hepatic Function  Total Protein 6.5 - 8.1 g/dL 6.7  7.3  7.1   Albumin 3.5 - 5.0 g/dL 3.7  4.2  3.8   AST 15 - 41 U/L 16  13  13    ALT 0 - 44 U/L 12  9  12    Alk Phosphatase 38 - 126 U/L 98  114  97   Total Bilirubin 0.3 - 1.2 mg/dL 0.6  0.4  0.9     If any question arise or pregnancy is identified during hospitalization, contact for bosentan: [phone]; macitentan: [phone]; ambrisentan: [phone].  Thank for you allowing [place] to participate in the care of this patient.  Nakai Pollio D 10/20/2022, 1:58 AM Clinical Pharmacist  Guides for Female Patient:  ambrisentan (LETAIRIS), macitentan (OPSUMIT), bosentan (TRACLEER).

## 2022-10-20 NOTE — Progress Notes (Addendum)
ANTICOAGULATION CONSULT NOTE - Initial Consult  Pharmacy Consult for Heparin  Indication: atrial fibrillation/bilateral DVT   Allergies  Allergen Reactions   Aspirin Anaphylaxis and Hives    Pt states that she is unable to take because she had bleeding in her brain   Other Other (See Comments)    Pt states that she is unable to take blood thinners because she had bleeding in her brain; Blood Thinners-per doctor at [facility] (Xarelto)    Patient Measurements: Height: 5' (152.4 cm) Weight: 88.9 kg (196 lb) IBW/kg (Calculated) : 45.5 Heparin Dosing Weight: 66.5 kg   Vital Signs: Temp: 98.7 F (37.1 C) (04/28 1843) BP: 119/67 (04/28 1843) Pulse Rate: 74 (04/28 1844)  Labs: Recent Labs    10/17/22 1139 10/17/22 1218 10/19/22 1847  HGB 10.1*  --  9.5*  HCT 32.1*  --  31.9*  PLT 187.0  --  189  CREATININE 1.14 1.30* 1.05*    Estimated Creatinine Clearance: 43.1 mL/min (A) (by C-G formula based on SCr of 1.05 mg/dL (H)).   Medical History: Past Medical History:  Diagnosis Date   Arthritis    Atrial fibrillation, persistent (HCC)    a. s/p TEE-guided DCCV on 08/20/2017; b. 06/2021 Recurrent Afib-->amio/TEE/DCCV.   CAD (coronary artery disease)    a. 01/2012 Cath: nonobs dzs; b. 04/2014 MV: No ischemia; c. 09/2017 Cath: Minimal nonobs RCA dzs, otw nl cors.   Cardiorenal syndrome    a. 09/2017 Creat rose to 2.7 w/ diuresis-->HD x 2.   Chronic back pain    Chronic combined systolic (congestive) and diastolic (congestive) heart failure (HCC)    a. Dx 2005 @ [facility];  b. 02/2014 Echo: EF 55-60%; c. 07/2017: EF 30-35%; d. 09/2017 Echo: EF 40-45%; e. 02/2018 Echo: EF 55-60%; f. 08/2019 Echo: EF 60-65%; g. 08/2020 RHC: RA 3, RV 30/1, PA 33/11 (20), PCWP 6; f. 08/2021 Echo: EF 60-65%, no rwma, GrIDD, nl RV fxn, RVSP 40.64mmHg, mod dil LA, mod TR.   Chronic respiratory failure (HCC)    a. on home O2   Gastritis    History of intracranial hemorrhage    a. 6/14 described by neurosurgery as  "small frontal posttraumatic SAH " - pt was on xarelto @ the time.   Hyperlipidemia    Hypertension    Interstitial lung disease (HCC)    Lipoma of back    Lymphedema    a. Chronic LLE edema.   NICM (nonischemic cardiomyopathy) (HCC)    a. 09/2017 Echo: EF 40-45%; b. 09/2017 Cath: minor, insignificant CAD; c. 02/2018 Echo: EF 55-60%, Gr1 DD; d. 08/2019 Echo: EF 60-65%, Gr2 DD.   Obesity    PAH (pulmonary artery hypertension) (HCC)    a. 09/2017 Echo: PASP ; b. 09/2017 RHC: RA 23, RV 84/13, PA 84/29, PCWP 20, CO 3.89, CI 1.89. LVEDP 18; c. 08/2020 RHC: RA 3, RV 30/1, PA 33/11 (20), PCWP 6. CO/CI 6.18/3.36. PVR 2.3 WU.   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 06/2021. Veg on PPM lead. Not candidate for extraction-->s/p 6 wks abx.   Sepsis with metabolic encephalopathy (HCC)    Sleep apnea    Streptococcal bacteremia    Stroke [facility]) 2014   Subarachnoid hemorrhage (HCC) 01/19/2013   Symptomatic bradycardia    a. s/p BSX PPM.   Thyroid nodule    Type II diabetes mellitus (HCC)    Urinary incontinence     Medications:  (Not in a  hospital admission)   Assessment: Pharmacy consulted to dose heparin in this 77 year old female admitted with abdominal pain, on Eliquis 5 mg PO BID PTA for chronic bilateral DVT's, Afib.  Last dose of Eliquis previous week.  Plan for EGD test on 4/30.   CrCl = 43.1 ml/min   Baseline HL = > 1.10   Goal of Therapy:  Heparin level 0.3-0.7 units/ml aPTT 66 - 102 seconds Monitor platelets by anticoagulation protocol: Yes   Plan:  4/29:  HL @ 0052 (baseline) = >1.10 Give 4000 units bolus x 1 Begin heparin drip at 1000 units/hr.  Use aPTT to dose until correlating with HL. Will check aPTT 8 hrs after start of drip on 4/29 @ 0900. Will recheck HL on 4/30 @ 0500.   Debra Saunders D 10/20/2022,12:17 AM

## 2022-10-20 NOTE — Consult Note (Addendum)
GI Inpatient Consult Note  Reason for Consult: Dr. Margo Aye    Attending Requesting Consult: epigastric pain  History of Present Illness: Debra Saunders is a 77 y.o. female seen for evaluation of epigastric pain at the request of Dr. Margo Aye. Past medical history significant for type 2 diabetes, obesity, OSA on CPAP, essential hypertension, anemia of chronic disease, CKD 3B, permanent atrial fibrillation on Eliquis, bilateral chronic DVTs, chronic diastolic CHF, GERD. Admitted for abd pain with CT as below.   She reports over the past few weeks developing issues with abdominal pain, notes that foods intensify the pain significantly and she placed herself in a very bland diet.  She feels even water could cause the pain to begin.  Mainly in mid to upper abdomen.  She had nausea daily over past few weeks with 1 episode of vomiting. She denies feeling any GERD symptoms or dysphagia. She denies any history of similar symptoms of epigastric pain. She reports she has gained about 10 pounds in the past 3 months.  She reports she has a chronic history of diarrhea for the past 2 years, at one point was on [place] and that helped some but she ran out.  She uses Imodium as needed. She does have some occasional nocturnal diarrhea. She endorses 1 episode of melena with dark tarry stools last week but normally has brown watery stools. No frequent lower abd pain.    She denies any tobacco alcohol or NSAID use.  Mainly uses Tylenol.  Last Colonoscopy: 2020-30 mm polyp sigmoid, diverticulosis sigmoid, descending, and transverse. 2 small polyps transverse. Path w/ TA in transverse. Tubulovillous adenoma sigmoid.  Last Endoscopy: per patient 15 years ago - normal   Past Medical History:  Past Medical History:  Diagnosis Date   Arthritis    Atrial fibrillation, persistent (HCC)    a. s/p TEE-guided DCCV on 08/20/2017; b. 06/2021 Recurrent Afib-->amio/TEE/DCCV.   CAD (coronary artery disease)    a. 01/2012 Cath:  nonobs dzs; b. 04/2014 MV: No ischemia; c. 09/2017 Cath: Minimal nonobs RCA dzs, otw nl cors.   Cardiorenal syndrome    a. 09/2017 Creat rose to 2.7 w/ diuresis-->HD x 2.   Chronic back pain    Chronic combined systolic (congestive) and diastolic (congestive) heart failure (HCC)    a. Dx 2005 @ [facility];  b. 02/2014 Echo: EF 55-60%; c. 07/2017: EF 30-35%; d. 09/2017 Echo: EF 40-45%; e. 02/2018 Echo: EF 55-60%; f. 08/2019 Echo: EF 60-65%; g. 08/2020 RHC: RA 3, RV 30/1, PA 33/11 (20), PCWP 6; f. 08/2021 Echo: EF 60-65%, no rwma, GrIDD, nl RV fxn, RVSP 40.19mmHg, mod dil LA, mod TR.   Chronic respiratory failure (HCC)    a. on home O2   Gastritis    History of intracranial hemorrhage    a. 6/14 described by neurosurgery as "small frontal posttraumatic SAH " - pt was on xarelto @ the time.   Hyperlipidemia    Hypertension    Interstitial lung disease (HCC)    Lipoma of back    Lymphedema    a. Chronic LLE edema.   NICM (nonischemic cardiomyopathy) (HCC)    a. 09/2017 Echo: EF 40-45%; b. 09/2017 Cath: minor, insignificant CAD; c. 02/2018 Echo: EF 55-60%, Gr1 DD; d. 08/2019 Echo: EF 60-65%, Gr2 DD.   Obesity    PAH (pulmonary artery hypertension) (HCC)    a. 09/2017 Echo: PASP ; b. 09/2017 RHC: RA 23, RV 84/13, PA 84/29, PCWP 20, CO 3.89, CI 1.89. LVEDP 18;  c. 08/2020 RHC: RA 3, RV 30/1, PA 33/11 (20), PCWP 6. CO/CI 6.18/3.36. PVR 2.3 WU.   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 06/2021. Veg on PPM lead. Not candidate for extraction-->s/p 6 wks abx.   Sepsis with metabolic encephalopathy (HCC)    Sleep apnea    Streptococcal bacteremia    Stroke (HCC) 2014   Subarachnoid hemorrhage (HCC) 01/19/2013   Symptomatic bradycardia    a. s/p BSX PPM.   Thyroid nodule    Type II diabetes mellitus (HCC)    Urinary incontinence     Problem List: Patient Active Problem List   Diagnosis Date Noted   Vaginal discharge 10/17/2022   Genital warts  10/17/2022   Head injury 08/30/2021   Stage 3b chronic kidney disease (CKD) (HCC) 08/23/2021   Anemia due to chronic kidney disease 08/23/2021   History of endocarditis 08/22/2021   Orthostatic hypotension 08/22/2021   Generalized weakness 08/21/2021   History of COVID-19    Infection due to implanted cardiac device (HCC) 07/25/2021   Abdominal pain 07/06/2021   Pressure injury of skin 07/01/2021   GI bleeding 06/25/2021   Black stool 06/25/2021   HTN (hypertension)    HLD (hyperlipidemia)    Chronic diastolic CHF (congestive heart failure) (HCC)    Acute renal failure superimposed on stage 3a chronic kidney disease (HCC)    DVT (deep venous thrombosis) (HCC)    Hyponatremia    Thrombocytopenia (HCC)    Diarrhea    Burn 04/30/2020   Obesity (BMI 30-39.9) 04/30/2020   Elevated alkaline phosphatase level 04/30/2020   Chronic diarrhea 11/04/2019   Dysuria 09/29/2019   Abnormal mammogram 09/29/2019   Breast pain, right 08/08/2019   Bilateral ovarian cysts 08/08/2019   B12 deficiency 08/02/2019   Iron deficiency 08/01/2019   Pansinusitis 07/06/2019   Shortness of breath    Atrial fibrillation with tachy brady syndrome  09/22/2017   Acute bilateral low back pain without sciatica 01/28/2017   Pain in limb 07/22/2016   Neuropathy 06/09/2016   Chronic deep vein thrombosis (DVT) (HCC) 03/03/2016   Chronic back pain 10/19/2015   Goiter 04/25/2015   Cervical disc disorder with radiculopathy of cervical region 03/30/2015   Severe dizziness 09/01/2014   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   Interstitial lung disease (HCC) 04/06/2014   Abnormality of gait 01/13/2014   Sinoatrial node dysfunction (HCC) 01/09/2014   Allergic rhinitis 10/26/2013   Lymphedema 08/03/2013   Constipation 04/12/2013   Costochondritis 01/19/2013   Fatigue 09/27/2012   Normocytic anemia 04/28/2012   OSA (obstructive sleep apnea) 04/22/2012   Pulmonary hypertension, unspecified (HCC) 03/30/2012    Hyperlipidemia 03/30/2012   Hypertension     Past Surgical History: Past Surgical History:  Procedure Laterality Date   ABDOMINAL HYSTERECTOMY  1969   BREAST BIOPSY Right 10/28/2019   lymph node bx, [place] Bx, pending path    CARDIAC CATHETERIZATION  2013   @ [facility]: No obstructive CAD: Only 20% ostial left circumflex.   CARDIOVERSION N/A 10/12/2017   Procedure: CARDIOVERSION;  Surgeon: Antonieta Iba, MD;  Location: [facility] ORS;  Service: Cardiovascular;  Laterality: N/A;   CARDIOVERSION N/A 07/04/2021   Procedure: CARDIOVERSION;  Surgeon: Laurey Morale, MD;  Location: [facility] ENDOSCOPY;  Service: Cardiovascular;  Laterality: N/A;   CATARACT EXTRACTION W/PHACO Right 09/19/2020   Procedure: CATARACT EXTRACTION PHACO AND INTRAOCULAR LENS PLACEMENT (IOC) RIGHT DIABETIC 3.95 00:43.0 9.2%;  Surgeon: Lockie Mola, MD;  Location:  [facility];  Service: Ophthalmology;  Laterality: Right;  Pt requests to be last sleep apnea   CATARACT EXTRACTION W/PHACO Left 10/03/2020   Procedure: CATARACT EXTRACTION PHACO AND INTRAOCULAR LENS PLACEMENT (IOC) LEFT DIABETIC;  Surgeon: Lockie Mola, MD;  Location: [facility];  Service: Ophthalmology;  Laterality: Left;  3.25 0:59.3 5.5%   CHOLECYSTECTOMY     COLONOSCOPY WITH PROPOFOL N/A 09/10/2018   Procedure: COLONOSCOPY WITH PROPOFOL;  Surgeon: Scot Jun, MD;  Location: [facility] ENDOSCOPY;  Service: Endoscopy;  Laterality: N/A;   CYST REMOVAL TRUNK  2002   BACK   DIALYSIS/PERMA CATHETER INSERTION N/A 10/06/2017   Procedure: DIALYSIS/PERMA CATHETER INSERTION;  Surgeon: Renford Dills, MD;  Location: [facility] INVASIVE CV LAB;  Service: Cardiovascular;  Laterality: N/A;   INSERT / REPLACE / REMOVE PACEMAKER     lipoma removal  2014   back   PACEMAKER INSERTION  2015   [employer] dual chamber pacemaker implanted by Dr Graciela Husbands for symptomatic bradycardia   PERMANENT PACEMAKER INSERTION N/A 01/09/2014   Procedure: PERMANENT PACEMAKER  INSERTION;  Surgeon: [facility] Salvia, MD;  Location: [facility] CATH LAB;  Service: Cardiovascular;  Laterality: N/A;   RIGHT HEART CATH N/A 04/19/2018   Procedure: RIGHT HEART CATH;  Surgeon: Laurey Morale, MD;  Location: [facility] INVASIVE CV LAB;  Service: Cardiovascular;  Laterality: N/A;   RIGHT HEART CATH N/A 08/30/2020   Procedure: RIGHT HEART CATH;  Surgeon: Laurey Morale, MD;  Location: [facility] INVASIVE CV LAB;  Service: Cardiovascular;  Laterality: N/A;   RIGHT HEART CATH N/A 02/11/2022   Procedure: RIGHT HEART CATH;  Surgeon: Laurey Morale, MD;  Location: [facility] INVASIVE CV LAB;  Service: Cardiovascular;  Laterality: N/A;   RIGHT/LEFT HEART CATH AND CORONARY ANGIOGRAPHY N/A 10/19/2017   Procedure: RIGHT/LEFT HEART CATH AND CORONARY ANGIOGRAPHY;  Surgeon: Iran Ouch, MD;  Location: [facility] INVASIVE CV LAB;  Service: Cardiovascular;  Laterality: N/A;   TEE WITHOUT CARDIOVERSION N/A 08/20/2017   Procedure: TRANSESOPHAGEAL ECHOCARDIOGRAM (TEE) & Direct current cardioversion;  Surgeon: Antonieta Iba, MD;  Location: [facility] ORS;  Service: Cardiovascular;  Laterality: N/A;   TEE WITHOUT CARDIOVERSION N/A 06/28/2021   Procedure: TRANSESOPHAGEAL ECHOCARDIOGRAM (TEE);  Surgeon: Antonieta Iba, MD;  Location: [facility] ORS;  Service: Cardiovascular;  Laterality: N/A;   TEE WITHOUT CARDIOVERSION N/A 07/04/2021   Procedure: TRANSESOPHAGEAL ECHOCARDIOGRAM (TEE);  Surgeon: Laurey Morale, MD;  Location: [facility] ENDOSCOPY;  Service: Cardiovascular;  Laterality: N/A;   TRIGGER FINGER RELEASE      Allergies: Allergies  Allergen Reactions   Aspirin Anaphylaxis and Hives    Pt states that she is unable to take because she had bleeding in her brain   Other Other (See Comments)    Pt states that she is unable to take blood thinners because she had bleeding in her brain; Blood Thinners-per doctor at [facility] (Xarelto)    Home Medications: (Not in a hospital admission)  Home medication reconciliation was completed with  the patient.   Scheduled Inpatient Medications:    cefadroxil  500 mg Oral BID   DULoxetine  60 mg Oral Daily   macitentan  10 mg Oral Daily   midodrine  5 mg Oral BID WC   pantoprazole (PROTONIX) IV  40 mg Intravenous Q12H   Selexipag  1,600 mcg Oral BID    Continuous Inpatient Infusions:    heparin 1,000 Units/hr (10/20/22 0127)   lactated ringers 1,000 mL with potassium chloride 40 mEq infusion 50 mL/hr  at 10/20/22 0143    PRN Inpatient Medications:  acetaminophen, HYDROmorphone (DILAUDID) injection, melatonin, oxyCODONE, polyethylene glycol, prochlorperazine  Family History: family history includes Bone cancer in her sister; Breast cancer in her maternal aunt and sister; COPD in her mother; Heart disease in her mother; Hypertension in her father, mother, sister, and sister; Stroke in her son; Thyroid disease in her mother, sister, and sister.    Social History:   reports that she quit smoking about 32 years ago. Her smoking use included cigarettes. She has a 0.60 pack-year smoking history. She has never used smokeless tobacco. She reports that she does not drink alcohol and does not use drugs.   Review of Systems: Constitutional: Weight is stable.  Eyes: No changes in vision. ENT: No oral lesions, sore throat.  GI: see HPI.  Heme/Lymph: No easy bruising.  CV: No chest pain.  GU: No hematuria.  Integumentary: No rashes.  Neuro: No headaches.  Psych: No depression/anxiety.  Endocrine: No heat/cold intolerance.  Allergic/Immunologic: No urticaria.  Resp: No cough, SOB.  Musculoskeletal: No joint swelling.    Physical Examination: BP (!) 173/80   Pulse (!) 54   Temp 98.5 F (36.9 C) (Oral)   Resp 20   Ht 5' (1.524 m)   Wt 88.9 kg   SpO2 92%   BMI 38.28 kg/m  Gen: NAD, alert and oriented x 4 HEENT: PEERLA, EOMI, Neck: supple, no JVD or thyromegaly Chest: CTA bilaterally, no wheezes, crackles, or other adventitious sounds. On 4L O2 CV: RRR, no m/g/c/r Abd:  soft, NT, ND, +BS in all four quadrants; no HSM, guarding, ridigity, or rebound tenderness Ext: + edema Skin: no rash or lesions noted Lymph: no LAD  Data: Lab Results  Component Value Date   WBC 6.6 10/20/2022   HGB 9.1 (L) 10/20/2022   HCT 30.4 (L) 10/20/2022   MCV 92.7 10/20/2022   PLT 168 10/20/2022   Recent Labs  Lab 10/17/22 1139 10/19/22 1847 10/20/22 0423  HGB 10.1* 9.5* 9.1*   Lab Results  Component Value Date   NA 141 10/20/2022   K 2.9 (L) 10/20/2022   CL 101 10/20/2022   CO2 32 10/20/2022   BUN 23 10/20/2022   CREATININE 0.96 10/20/2022   Lab Results  Component Value Date   ALT 12 10/20/2022   AST 14 (L) 10/20/2022   ALKPHOS 98 10/20/2022   BILITOT 0.8 10/20/2022   Recent Labs  Lab 10/20/22 0052  APTT 29  INR 1.2    CT a/p 10/17/22-IMPRESSION: 1. Possible short-segment intussusception or ulceration in the region of the pylorus. Recommend correlation with endoscopy. 2. Subcentimeter cystic lesions in the pancreatic body and uncinate process, not definitely seen on prior studies. Recommend follow-up MRI/MRCP with and without contrast in 2 years. 3. Colonic diverticulosis without evidence of acute diverticulitis. 4. Bilateral adnexal cysts, increased in size since 2023. Recommend follow-up pelvic ultrasound in 6-12 months.    Assessment/Plan: Ms. Klindt is a 77 y.o. female admitted for epigastric pain.    Epigastric pain - CT as above concerning for ulceration of pylorus vs. Underlying malignancy. Intussusception of stomach felt very unlikely. Planning for EGD tomorrow 4/30 after eliquis washout for further evaluation. Last dose of eliquis was 4/28 AM. Will continue w/ PPI BID. Clears today then NPO at MN prior to EGD tomorrow. Atria fib/hx of chronic bilateral DVTs - continue holding eliquis, on heparin gtt.  CHF  Hypokalemia-primary team to replace prior to sedated procedures AOCD--Hgb around baseline 9-10 Chronic respiratory  failure/OSA-on 4L o2  at home since 2005, on CPAP at night Diarrhea - can address as outpatient   Recommendations: Clear liquid diet today then NPO at midnight PPI BID Hold eliquis, on heparin gtt per pharmacy (will need to be held 6 hrs prior to EGD), plan to d/c around 4am on 10/21/22 Monitor CBC, CMP Supportive care per primary team   Case was discussed with Dr. Timothy Lasso. Thank you for the consult. Please call with questions or concerns.  Ardelia Mems, PA-C [facility]

## 2022-10-20 NOTE — ED Notes (Signed)
Perineal care performed using facility approved bath wipes. Absorbent pad placed underneath patient and new brief applied to keep purewick in place.

## 2022-10-20 NOTE — Progress Notes (Signed)
ANTICOAGULATION CONSULT NOTE - Initial Consult  Pharmacy Consult for Heparin  Indication: atrial fibrillation/bilateral DVT   Allergies  Allergen Reactions   Aspirin Anaphylaxis and Hives    Pt states that she is unable to take because she had bleeding in her brain   Other Other (See Comments)    Pt states that she is unable to take blood thinners because she had bleeding in her brain; Blood Thinners-per doctor at [facility] (Xarelto)    Patient Measurements: Height: 5' (152.4 cm) Weight: 88.9 kg (196 lb) IBW/kg (Calculated) : 45.5 Heparin Dosing Weight: 66.5 kg   Vital Signs: Temp: 98.5 F (36.9 C) (04/29 0148) Temp Source: Oral (04/29 0148) BP: 153/78 (04/29 0930) Pulse Rate: 54 (04/29 0930)  Labs: Recent Labs    10/17/22 1139 10/17/22 1218 10/19/22 1847 10/20/22 0052 10/20/22 0423 10/20/22 0814  HGB 10.1*  --  9.5*  --  9.1*  --   HCT 32.1*  --  31.9*  --  30.4*  --   PLT 187.0  --  189  --  168  --   APTT  --   --   --  29  --  78*  LABPROT  --   --   --  14.6  --   --   INR  --   --   --  1.2  --   --   HEPARINUNFRC  --   --   --  >1.10*  --   --   CREATININE 1.14 1.30* 1.05*  --  0.96  --      Estimated Creatinine Clearance: 47.2 mL/min (by C-G formula based on SCr of 0.96 mg/dL).   Medical History: Past Medical History:  Diagnosis Date   Arthritis    Atrial fibrillation, persistent (HCC)    a. s/p TEE-guided DCCV on 08/20/2017; b. 06/2021 Recurrent Afib-->amio/TEE/DCCV.   CAD (coronary artery disease)    a. 01/2012 Cath: nonobs dzs; b. 04/2014 MV: No ischemia; c. 09/2017 Cath: Minimal nonobs RCA dzs, otw nl cors.   Cardiorenal syndrome    a. 09/2017 Creat rose to 2.7 w/ diuresis-->HD x 2.   Chronic back pain    Chronic combined systolic (congestive) and diastolic (congestive) heart failure (HCC)    a. Dx 2005 @ [facility];  b. 02/2014 Echo: EF 55-60%; c. 07/2017: EF 30-35%; d. 09/2017 Echo: EF 40-45%; e. 02/2018 Echo: EF 55-60%; f. 08/2019 Echo: EF 60-65%; g.  08/2020 RHC: RA 3, RV 30/1, PA 33/11 (20), PCWP 6; f. 08/2021 Echo: EF 60-65%, no rwma, GrIDD, nl RV fxn, RVSP 40.73mmHg, mod dil LA, mod TR.   Chronic respiratory failure (HCC)    a. on home O2   Gastritis    History of intracranial hemorrhage    a. 6/14 described by neurosurgery as "small frontal posttraumatic SAH " - pt was on xarelto @ the time.   Hyperlipidemia    Hypertension    Interstitial lung disease (HCC)    Lipoma of back    Lymphedema    a. Chronic LLE edema.   NICM (nonischemic cardiomyopathy) (HCC)    a. 09/2017 Echo: EF 40-45%; b. 09/2017 Cath: minor, insignificant CAD; c. 02/2018 Echo: EF 55-60%, Gr1 DD; d. 08/2019 Echo: EF 60-65%, Gr2 DD.   Obesity    PAH (pulmonary artery hypertension) (HCC)    a. 09/2017 Echo: PASP ; b. 09/2017 RHC: RA 23, RV 84/13, PA 84/29, PCWP 20, CO 3.89, CI 1.89. LVEDP 18; c. 08/2020 RHC: RA 3,  RV 30/1, PA 33/11 (20), PCWP 6. CO/CI 6.18/3.36. PVR 2.3 WU.   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 06/2021. Veg on PPM lead. Not candidate for extraction-->s/p 6 wks abx.   Sepsis with metabolic encephalopathy (HCC)    Sleep apnea    Streptococcal bacteremia    Stroke [facility]) 2014   Subarachnoid hemorrhage (HCC) 01/19/2013   Symptomatic bradycardia    a. s/p BSX PPM.   Thyroid nodule    Type II diabetes mellitus (HCC)    Urinary incontinence     Medications:  (Not in a hospital admission)  Assessment: Pharmacy consulted to dose heparin in this 77 year old female admitted with abdominal pain, on Eliquis 5 mg PO BID PTA for chronic bilateral DVT's, Afib.  Last dose of Eliquis previous week.  Plan for EGD test on 4/30.   CrCl = 43.1 ml/min   Baseline HL = > 1.10   4/29 0814 aPTT 78, therapeutic x1 @ 1000 units/hr  Goal of Therapy:  Heparin level 0.3-0.7 units/ml aPTT 66 - 102 seconds Monitor platelets by anticoagulation protocol: Yes   Plan:  Heparin therapeutic x 1  Continue heparin  drip at 1000 units/hr.  Use aPTT to dose until correlating with HL. Will check aPTT 8 hrs  to confirm   Sharen Hones, PharmD, BCPS Clinical Pharmacist   10/20/2022,10:21 AM

## 2022-10-20 NOTE — H&P (View-Only) (Signed)
 GI Inpatient Consult Note  Reason for Consult: Dr. Hall    Attending Requesting Consult: epigastric pain  History of Present Illness: Debra Saunders is a 77 y.o. female seen for evaluation of epigastric pain at the request of Dr. Hall. Past medical history significant for type 2 diabetes, obesity, OSA on CPAP, essential hypertension, anemia of chronic disease, CKD 3B, permanent atrial fibrillation on Eliquis, bilateral chronic DVTs, chronic diastolic CHF, GERD. Admitted for abd pain with CT as below.   She reports over the past few weeks developing issues with abdominal pain, notes that foods intensify the pain significantly and she placed herself in a very bland diet.  She feels even water could cause the pain to begin.  Mainly in mid to upper abdomen.  She had nausea daily over past few weeks with 1 episode of vomiting. She denies feeling any GERD symptoms or dysphagia. She denies any history of similar symptoms of epigastric pain. She reports she has gained about 10 pounds in the past 3 months.  She reports she has a chronic history of diarrhea for the past 2 years, at one point was on questran and that helped some but she ran out.  She uses Imodium as needed. She does have some occasional nocturnal diarrhea. She endorses 1 episode of melena with dark tarry stools last week but normally has brown watery stools. No frequent lower abd pain.    She denies any tobacco alcohol or NSAID use.  Mainly uses Tylenol.  Last Colonoscopy: 2020-30 mm polyp sigmoid, diverticulosis sigmoid, descending, and transverse. 2 small polyps transverse. Path w/ TA in transverse. Tubulovillous adenoma sigmoid.  Last Endoscopy: per patient 15 years ago - normal   Past Medical History:  Past Medical History:  Diagnosis Date   Arthritis    Atrial fibrillation, persistent (HCC)    a. s/p TEE-guided DCCV on 08/20/2017; b. 06/2021 Recurrent Afib-->amio/TEE/DCCV.   CAD (coronary artery disease)    a. 01/2012 Cath:  nonobs dzs; b. 04/2014 MV: No ischemia; c. 09/2017 Cath: Minimal nonobs RCA dzs, otw nl cors.   Cardiorenal syndrome    a. 09/2017 Creat rose to 2.7 w/ diuresis-->HD x 2.   Chronic back pain    Chronic combined systolic (congestive) and diastolic (congestive) heart failure (HCC)    a. Dx 2005 @ [facility];  b. 02/2014 Echo: EF 55-60%; c. 07/2017: EF 30-35%; d. 09/2017 Echo: EF 40-45%; e. 02/2018 Echo: EF 55-60%; f. 08/2019 Echo: EF 60-65%; g. 08/2020 RHC: RA 3, RV 30/1, PA 33/11 (20), PCWP 6; f. 08/2021 Echo: EF 60-65%, no rwma, GrIDD, nl RV fxn, RVSP 40.3mmHg, mod dil LA, mod TR.   Chronic respiratory failure (HCC)    a. on home O2   Gastritis    History of intracranial hemorrhage    a. 6/14 described by neurosurgery as "small frontal posttraumatic SAH " - pt was on xarelto @ the time.   Hyperlipidemia    Hypertension    Interstitial lung disease (HCC)    Lipoma of back    Lymphedema    a. Chronic LLE edema.   NICM (nonischemic cardiomyopathy) (HCC)    a. 09/2017 Echo: EF 40-45%; b. 09/2017 Cath: minor, insignificant CAD; c. 02/2018 Echo: EF 55-60%, Gr1 DD; d. 08/2019 Echo: EF 60-65%, Gr2 DD.   Obesity    PAH (pulmonary artery hypertension) (HCC)    a. 09/2017 Echo: PASP 65mmHg; b. 09/2017 RHC: RA 23, RV 84/13, PA 84/29, PCWP 20, CO 3.89, CI 1.89. LVEDP 18;   c. 08/2020 RHC: RA 3, RV 30/1, PA 33/11 (20), PCWP 6. CO/CI 6.18/3.36. PVR 2.3 WU.   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 06/2021. Veg on PPM lead. Not candidate for extraction-->s/p 6 wks abx.   Sepsis with metabolic encephalopathy (HCC)    Sleep apnea    Streptococcal bacteremia    Stroke (HCC) 2014   Subarachnoid hemorrhage (HCC) 01/19/2013   Symptomatic bradycardia    a. s/p BSX PPM.   Thyroid nodule    Type II diabetes mellitus (HCC)    Urinary incontinence     Problem List: Patient Active Problem List   Diagnosis Date Noted   Vaginal discharge 10/17/2022   Genital warts  10/17/2022   Head injury 08/30/2021   Stage 3b chronic kidney disease (CKD) (HCC) 08/23/2021   Anemia due to chronic kidney disease 08/23/2021   History of endocarditis 08/22/2021   Orthostatic hypotension 08/22/2021   Generalized weakness 08/21/2021   History of COVID-19    Infection due to implanted cardiac device (HCC) 07/25/2021   Abdominal pain 07/06/2021   Pressure injury of skin 07/01/2021   GI bleeding 06/25/2021   Black stool 06/25/2021   HTN (hypertension)    HLD (hyperlipidemia)    Chronic diastolic CHF (congestive heart failure) (HCC)    Acute renal failure superimposed on stage 3a chronic kidney disease (HCC)    DVT (deep venous thrombosis) (HCC)    Hyponatremia    Thrombocytopenia (HCC)    Diarrhea    Burn 04/30/2020   Obesity (BMI 30-39.9) 04/30/2020   Elevated alkaline phosphatase level 04/30/2020   Chronic diarrhea 11/04/2019   Dysuria 09/29/2019   Abnormal mammogram 09/29/2019   Breast pain, right 08/08/2019   Bilateral ovarian cysts 08/08/2019   B12 deficiency 08/02/2019   Iron deficiency 08/01/2019   Pansinusitis 07/06/2019   Shortness of breath    Atrial fibrillation with tachy brady syndrome  09/22/2017   Acute bilateral low back pain without sciatica 01/28/2017   Pain in limb 07/22/2016   Neuropathy 06/09/2016   Chronic deep vein thrombosis (DVT) (HCC) 03/03/2016   Chronic back pain 10/19/2015   Goiter 04/25/2015   Cervical disc disorder with radiculopathy of cervical region 03/30/2015   Severe dizziness 09/01/2014   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   Interstitial lung disease (HCC) 04/06/2014   Abnormality of gait 01/13/2014   Sinoatrial node dysfunction (HCC) 01/09/2014   Allergic rhinitis 10/26/2013   Lymphedema 08/03/2013   Constipation 04/12/2013   Costochondritis 01/19/2013   Fatigue 09/27/2012   Normocytic anemia 04/28/2012   OSA (obstructive sleep apnea) 04/22/2012   Pulmonary hypertension, unspecified (HCC) 03/30/2012    Hyperlipidemia 03/30/2012   Hypertension     Past Surgical History: Past Surgical History:  Procedure Laterality Date   ABDOMINAL HYSTERECTOMY  1969   BREAST BIOPSY Right 10/28/2019   lymph node bx, Us Bx, pending path    CARDIAC CATHETERIZATION  2013   @ [facility]: No obstructive CAD: Only 20% ostial left circumflex.   CARDIOVERSION N/A 10/12/2017   Procedure: CARDIOVERSION;  Surgeon: Gollan, Timothy J, MD;  Location: [facility];  Service: Cardiovascular;  Laterality: N/A;   CARDIOVERSION N/A 07/04/2021   Procedure: CARDIOVERSION;  Surgeon: McLean, Dalton S, MD;  Location: [facility];  Service: Cardiovascular;  Laterality: N/A;   CATARACT EXTRACTION W/PHACO Right 09/19/2020   Procedure: CATARACT EXTRACTION PHACO AND INTRAOCULAR LENS PLACEMENT (IOC) RIGHT DIABETIC 3.95 00:43.0 9.2%;  Surgeon: Brasington, Chadwick, MD;  Location:   [facility];  Service: Ophthalmology;  Laterality: Right;  Pt requests to be last sleep apnea   CATARACT EXTRACTION W/PHACO Left 10/03/2020   Procedure: CATARACT EXTRACTION PHACO AND INTRAOCULAR LENS PLACEMENT (IOC) LEFT DIABETIC;  Surgeon: Brasington, Chadwick, MD;  Location: [facility];  Service: Ophthalmology;  Laterality: Left;  3.25 0:59.3 5.5%   CHOLECYSTECTOMY     COLONOSCOPY WITH PROPOFOL N/A 09/10/2018   Procedure: COLONOSCOPY WITH PROPOFOL;  Surgeon: Elliott, Robert T, MD;  Location: [facility];  Service: Endoscopy;  Laterality: N/A;   CYST REMOVAL TRUNK  2002   BACK   DIALYSIS/PERMA CATHETER INSERTION N/A 10/06/2017   Procedure: DIALYSIS/PERMA CATHETER INSERTION;  Surgeon: Schnier, Gregory G, MD;  Location: [facility];  Service: Cardiovascular;  Laterality: N/A;   INSERT / REPLACE / REMOVE PACEMAKER     lipoma removal  2014   back   PACEMAKER INSERTION  2015   [employer] dual chamber pacemaker implanted by Dr Klein for symptomatic bradycardia   PERMANENT PACEMAKER INSERTION N/A 01/09/2014   Procedure: PERMANENT PACEMAKER  INSERTION;  Surgeon: Steven C Klein, MD;  Location: [facility];  Service: Cardiovascular;  Laterality: N/A;   RIGHT HEART CATH N/A 04/19/2018   Procedure: RIGHT HEART CATH;  Surgeon: McLean, Dalton S, MD;  Location: [facility];  Service: Cardiovascular;  Laterality: N/A;   RIGHT HEART CATH N/A 08/30/2020   Procedure: RIGHT HEART CATH;  Surgeon: McLean, Dalton S, MD;  [facility]: [facility];  Service: Cardiovascular;  Laterality: N/A;   RIGHT HEART CATH N/A 02/11/2022   Procedure: RIGHT HEART CATH;  Surgeon: McLean, Dalton S, MD;  [facility]: [facility];  Service: Cardiovascular;  Laterality: N/A;   RIGHT/LEFT HEART CATH AND CORONARY ANGIOGRAPHY N/A 10/19/2017   Procedure: RIGHT/LEFT HEART CATH AND CORONARY ANGIOGRAPHY;  Surgeon: Arida, Muhammad A, MD;  Location: [facility];  Service: Cardiovascular;  Laterality: N/A;   TEE WITHOUT CARDIOVERSION N/A 08/20/2017   Procedure: TRANSESOPHAGEAL ECHOCARDIOGRAM (TEE) & Direct current cardioversion;  Surgeon: Gollan, Timothy J, MD;  Location: [facility];  Service: Cardiovascular;  Laterality: N/A;   TEE WITHOUT CARDIOVERSION N/A 06/28/2021   Procedure: TRANSESOPHAGEAL ECHOCARDIOGRAM (TEE);  Surgeon: Gollan, Timothy J, MD;  Location: [facility];  Service: Cardiovascular;  Laterality: N/A;   TEE WITHOUT CARDIOVERSION N/A 07/04/2021   Procedure: TRANSESOPHAGEAL ECHOCARDIOGRAM (TEE);  Surgeon: McLean, Dalton S, MD;  [facility];  Service: Cardiovascular;  Laterality: N/A;   TRIGGER FINGER RELEASE      Allergies: Allergies  Allergen Reactions   Aspirin Anaphylaxis and Hives    Pt states that she is unable to take because she had bleeding in her brain   Other Other (See Comments)    Pt states that she is unable to take blood thinners because she had bleeding in her brain; Blood Thinners-per doctor at [name] (Xarelto)    Home Medications: (Not in a hospital admission)  Home medication reconciliation was completed with  the patient.   Scheduled Inpatient Medications:    cefadroxil  500 mg Oral BID   DULoxetine  60 mg Oral Daily   macitentan  10 mg Oral Daily   midodrine  5 mg Oral BID WC   pantoprazole (PROTONIX) IV  40 mg Intravenous Q12H   Selexipag  1,600 mcg Oral BID    Continuous Inpatient Infusions:    heparin 1,000 Units/hr (10/20/22 0127)   lactated ringers 1,000 mL with potassium chloride 40 mEq infusion 50 mL/hr   at 10/20/22 0143    PRN Inpatient Medications:  acetaminophen, HYDROmorphone (DILAUDID) injection, melatonin, oxyCODONE, polyethylene glycol, prochlorperazine  Family History: family history includes Bone cancer in her sister; Breast cancer in her maternal aunt and sister; COPD in her mother; Heart disease in her mother; Hypertension in her father, mother, sister, and sister; Stroke in her son; Thyroid disease in her mother, sister, and sister.    Social History:   reports that she quit smoking about 32 years ago. Her smoking use included cigarettes. She has a 0.60 pack-year smoking history. She has never used smokeless tobacco. She reports that she does not drink alcohol and does not use drugs.   Review of Systems: Constitutional: Weight is stable.  Eyes: No changes in vision. ENT: No oral lesions, sore throat.  GI: see HPI.  Heme/Lymph: No easy bruising.  CV: No chest pain.  GU: No hematuria.  Integumentary: No rashes.  Neuro: No headaches.  Psych: No depression/anxiety.  Endocrine: No heat/cold intolerance.  Allergic/Immunologic: No urticaria.  Resp: No cough, SOB.  Musculoskeletal: No joint swelling.    Physical Examination: BP (!) 173/80   Pulse (!) 54   Temp 98.5 F (36.9 C) (Oral)   Resp 20   Ht 5' (1.524 m)   Wt 88.9 kg   SpO2 92%   BMI 38.28 kg/m  Gen: NAD, alert and oriented x 4 HEENT: PEERLA, EOMI, Neck: supple, no JVD or thyromegaly Chest: CTA bilaterally, no wheezes, crackles, or other adventitious sounds. On 4L O2 CV: RRR, no m/g/c/r Abd:  soft, NT, ND, +BS in all four quadrants; no HSM, guarding, ridigity, or rebound tenderness Ext: + edema Skin: no rash or lesions noted Lymph: no LAD  Data: Lab Results  Component Value Date   WBC 6.6 10/20/2022   HGB 9.1 (L) 10/20/2022   HCT 30.4 (L) 10/20/2022   MCV 92.7 10/20/2022   PLT 168 10/20/2022   Recent Labs  Lab 10/17/22 1139 10/19/22 1847 10/20/22 0423  HGB 10.1* 9.5* 9.1*   Lab Results  Component Value Date   NA 141 10/20/2022   K 2.9 (L) 10/20/2022   CL 101 10/20/2022   CO2 32 10/20/2022   BUN 23 10/20/2022   CREATININE 0.96 10/20/2022   Lab Results  Component Value Date   ALT 12 10/20/2022   AST 14 (L) 10/20/2022   ALKPHOS 98 10/20/2022   BILITOT 0.8 10/20/2022   Recent Labs  Lab 10/20/22 0052  APTT 29  INR 1.2    CT a/p 10/17/22-IMPRESSION: 1. Possible short-segment intussusception or ulceration in the region of the pylorus. Recommend correlation with endoscopy. 2. Subcentimeter cystic lesions in the pancreatic body and uncinate process, not definitely seen on prior studies. Recommend follow-up MRI/MRCP with and without contrast in 2 years. 3. Colonic diverticulosis without evidence of acute diverticulitis. 4. Bilateral adnexal cysts, increased in size since 2023. Recommend follow-up pelvic ultrasound in 6-12 months.    Assessment/Plan: Debra Saunders is a 77 y.o. female admitted for epigastric pain.    Epigastric pain - CT as above concerning for ulceration of pylorus vs. Underlying malignancy. Intussusception of stomach felt very unlikely. Planning for EGD tomorrow 4/30 after eliquis washout for further evaluation. Last dose of eliquis was 4/28 AM. Will continue w/ PPI BID. Clears today then NPO at MN prior to EGD tomorrow. Atria fib/hx of chronic bilateral DVTs - continue holding eliquis, on heparin gtt.  CHF  Hypokalemia-primary team to replace prior to sedated procedures AOCD--Hgb around baseline 9-10 Chronic respiratory   failure/OSA-on 4L o2  at home since 2005, on CPAP at night Diarrhea - can address as outpatient   Recommendations: Clear liquid diet today then NPO at midnight PPI BID Hold eliquis, on heparin gtt per pharmacy (will need to be held 6 hrs prior to EGD), plan to d/c around 4am on 10/21/22 Monitor CBC, CMP Supportive care per primary team   Case was discussed with Dr. Russo. Thank you for the consult. Please call with questions or concerns.  Simon Aaberg B Shogo Larkey, PA-C [facility]  

## 2022-10-20 NOTE — Progress Notes (Signed)
ANTICOAGULATION CONSULT NOTE -   Pharmacy Consult for Heparin  Indication: atrial fibrillation/bilateral DVT   Allergies  Allergen Reactions   Aspirin Anaphylaxis and Hives    Pt states that she is unable to take because she had bleeding in her brain   Other Other (See Comments)    Pt states that she is unable to take blood thinners because she had bleeding in her brain; Blood Thinners-per doctor at [facility] (Xarelto)    Patient Measurements: Height: 5' (152.4 cm) Weight: 88.9 kg (196 lb) IBW/kg (Calculated) : 45.5 Heparin Dosing Weight: 66.5 kg   Vital Signs: BP: 139/69 (04/29 1430) Pulse Rate: 61 (04/29 1430)  Labs: Recent Labs    10/19/22 1847 10/20/22 0052 10/20/22 0423 10/20/22 0814 10/20/22 1547  HGB 9.5*  --  9.1*  --   --   HCT 31.9*  --  30.4*  --   --   PLT 189  --  168  --   --   APTT  --  29  --  78* 79*  LABPROT  --  14.6  --   --   --   INR  --  1.2  --   --   --   HEPARINUNFRC  --  >1.10*  --   --   --   CREATININE 1.05*  --  0.96  --   --      Estimated Creatinine Clearance: 47.2 mL/min (by C-G formula based on SCr of 0.96 mg/dL).   Medical History: Past Medical History:  Diagnosis Date   Arthritis    Atrial fibrillation, persistent (HCC)    a. s/p TEE-guided DCCV on 08/20/2017; b. 06/2021 Recurrent Afib-->amio/TEE/DCCV.   CAD (coronary artery disease)    a. 01/2012 Cath: nonobs dzs; b. 04/2014 MV: No ischemia; c. 09/2017 Cath: Minimal nonobs RCA dzs, otw nl cors.   Cardiorenal syndrome    a. 09/2017 Creat rose to 2.7 w/ diuresis-->HD x 2.   Chronic back pain    Chronic combined systolic (congestive) and diastolic (congestive) heart failure (HCC)    a. Dx 2005 @ [facility];  b. 02/2014 Echo: EF 55-60%; c. 07/2017: EF 30-35%; d. 09/2017 Echo: EF 40-45%; e. 02/2018 Echo: EF 55-60%; f. 08/2019 Echo: EF 60-65%; g. 08/2020 RHC: RA 3, RV 30/1, PA 33/11 (20), PCWP 6; f. 08/2021 Echo: EF 60-65%, no rwma, GrIDD, nl RV fxn, RVSP 40.4mmHg, mod dil LA, mod TR.   Chronic  respiratory failure (HCC)    a. on home O2   Gastritis    History of intracranial hemorrhage    a. 6/14 described by neurosurgery as "small frontal posttraumatic SAH " - pt was on xarelto @ the time.   Hyperlipidemia    Hypertension    Interstitial lung disease (HCC)    Lipoma of back    Lymphedema    a. Chronic LLE edema.   NICM (nonischemic cardiomyopathy) (HCC)    a. 09/2017 Echo: EF 40-45%; b. 09/2017 Cath: minor, insignificant CAD; c. 02/2018 Echo: EF 55-60%, Gr1 DD; d. 08/2019 Echo: EF 60-65%, Gr2 DD.   Obesity    PAH (pulmonary artery hypertension) (HCC)    a. 09/2017 Echo: PASP ; b. 09/2017 RHC: RA 23, RV 84/13, PA 84/29, PCWP 20, CO 3.89, CI 1.89. LVEDP 18; c. 08/2020 RHC: RA 3, RV 30/1, PA 33/11 (20), PCWP 6. CO/CI 6.18/3.36. PVR 2.3 WU.   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 06/2021.  Veg on PPM lead. Not candidate for extraction-->s/p 6 wks abx.   Sepsis with metabolic encephalopathy (HCC)    Sleep apnea    Streptococcal bacteremia    Stroke [facility]) 2014   Subarachnoid hemorrhage (HCC) 01/19/2013   Symptomatic bradycardia    a. s/p BSX PPM.   Thyroid nodule    Type II diabetes mellitus (HCC)    Urinary incontinence     Medications:  (Not in a hospital admission)  Assessment: Pharmacy consulted to dose heparin in this 77 year old female admitted with abdominal pain, on Eliquis 5 mg PO BID PTA for chronic bilateral DVT's, Afib.  Last dose of Eliquis previous week.  Plan for EGD test on 4/30.   CrCl = 43.1 ml/min   Baseline HL = > 1.10   4/29 0814 aPTT 78, therapeutic x1 @ 1000 units/hr 4/29 1547 aPTT 79, therapeutic x2  Goal of Therapy:  Heparin level 0.3-0.7 units/ml aPTT 66 - 102 seconds Monitor platelets by anticoagulation protocol: Yes   Plan:  4/29 1547 aPTT 79, therapeutic x2 Heparin therapeutic x 2  Continue heparin drip at 1000 units/hr.  Use aPTT to dose until correlating with HL. Will  check aPTT /HL with am labs -plan to hold heparin drip @0500  on 4/30 for procedure   Angelique Blonder, PharmD Clinical Pharmacist   10/20/2022,4:50 PM

## 2022-10-20 NOTE — Progress Notes (Signed)
Monitoring by Pharmacy for Pulmonary Hypertension Treatment   Indication - Continuation of prior to admission medication   Patient is 77 y.o.  with history of PAH on chronic macitentan (OPSUMIT) PTA and will be continued while hospitalized.   Continuing this medication order as an inpatient requires that monitoring parameters per REMS requirements must be met.  Chronic therapy is under the supervision of Marca Ancona  who is enrolled in the REMS program and is being notified of continuation of therapy. A staff message in EPIC has been sent notifying the certified prescriber.  Per patient report has previously been educated on Pregnancy risk and Hepatotoxicity. On admission pregnancy risk has been assessed and no monitoring required, and hysterectomy has been confirmed as birth control option for this patient.  Hepatic function has been evaluated. AST / ALT appropriate to continue medication at this time.     Latest Ref Rng & Units 10/20/2022    4:23 AM 10/19/2022    6:47 PM 10/17/2022   11:39 AM  Hepatic Function  Total Protein 6.5 - 8.1 g/dL 6.6  6.7  7.3   Albumin 3.5 - 5.0 g/dL 3.8  3.7  4.2   AST 15 - 41 U/L 14  16  13    ALT 0 - 44 U/L 12  12  9    Alk Phosphatase 38 - 126 U/L 98  98  114   Total Bilirubin 0.3 - 1.2 mg/dL 0.8  0.6  0.4     If any question arise or pregnancy is identified during hospitalization, contact for bosentan: [phone]; macitentan: [phone]; ambrisentan: [phone].  Thank for you allowing [place] to participate in the care of this patient.  Sharen Hones, PharmD, BCPS Clinical Pharmacist   10/20/2022, 10:11 AM Clinical Pharmacist  Guides for Female Patient:  ambrisentan (LETAIRIS), macitentan (OPSUMIT), bosentan (TRACLEER).

## 2022-10-20 NOTE — Progress Notes (Addendum)
Progress Note    Debra Saunders  ZOX:[id number] DOB: July 24, 1945  DOA: 10/19/2022 PCP: Glori Luis, MD      Brief Narrative:    Medical records reviewed and are as summarized below:  Debra Saunders is a 77 y.o. 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  She presented to the hospital with upper abdominal pain that is worse after meals.  This has been going on for at least 2 weeks.  She saw her PCP on 10/17/2022.  CT scan abdomen pelvis was concerning for "possible short segment intussusception or ulceration in the region of the pylorus".  She was advised to go to the emergency department for further management.      Assessment/Plan:   Principal Problem:   Abdominal pain Active Problems:   Pulmonary hypertension, unspecified (HCC)   Chronic diastolic CHF (congestive heart failure) (HCC)   Permanent atrial fibrillation (HCC)   Body mass index is 38.28 kg/m.  (Obesity)   Upper abdominal pain: CT scanning abdomen and pelvis concerning for ulcer in the region of the pylorus.  Continue IV Protonix.  Analgesics as needed for pain.  Clear liquid diet and n.p.o. after midnight.  Follow-up with gastroenterologist.   Hypokalemia: Replete potassium and monitor levels.   Permanent atrial fibrillation: Eliquis has been held.  She has been started on IV heparin drip.  Monitor heparin level per protocol.  CHA2DS2-VASc score is 8   History of left leg DVT in 2014 per patient: Eliquis on hold.   Chronic diastolic CHF, pulmonary hypertension: Compensated.  She is on macitentan and selexipag.  Continue torsemide. 2D echo on 08/23/2021 showed EF estimated at 60 to  65%, grade 1 diastolic dysfunction.   History of Streptococcus bacteremia and infected pacemaker on January 2023: She is on cefadroxil for chronic suppressive antibiotic therapy   Other comorbidities include OSA on CPAP at night, history of stroke, type II DM, anxiety, depression, history of chronic hypotension on midodrine, interstitial lung disease,    Diet Order             Diet clear liquid Room service appropriate? Yes; Fluid consistency: Thin  Diet effective now                            Consultants: Gastroenterologist  Procedures: None    Medications:    cefadroxil  500 mg Oral BID   DULoxetine  60 mg Oral Daily   gabapentin  300 mg Oral QHS   macitentan  10 mg Oral Daily   midodrine  5 mg Oral BID WC   pantoprazole (PROTONIX) IV  40 mg Intravenous Q12H   potassium chloride  40 mEq Oral Q4H   Selexipag  1,600 mcg Oral BID   torsemide  40 mg Oral BID   Continuous Infusions:  heparin 1,000 Units/hr (10/20/22 0956)     Anti-infectives (From admission, onward)    Start     Dose/Rate Route Frequency Ordered Stop   10/20/22 0000  cefadroxil (DURICEF) capsule 500 mg        500 mg Oral 2 times daily 10/19/22 2358  Family Communication/Anticipated D/C date and plan/Code Status   DVT prophylaxis:      Code Status: Full Code  Family Communication: None Disposition Plan: Plan to discharge home in 1 to 2 days   Status is: Inpatient Remains inpatient appropriate because: Upper abdominal pain, plan for endoscopic workup       Subjective:   Interval events noted.  She complains of upper abdominal pain.  No vomiting, hematemesis or bloody stools.  Objective:    Vitals:   10/20/22 1315 10/20/22 1337 10/20/22 1400 10/20/22 1430  BP: (!) 174/98 (!) 164/90 (!) 149/78 139/69  Pulse: 71  (!) 56 61  Resp: 19 (!) 21 16 18   Temp:      TempSrc:      SpO2: 99% 100% 100% 98%  Weight:      Height:       No data  found.  No intake or output data in the 24 hours ending 10/20/22 1507 Filed Weights   10/19/22 1843  Weight: 88.9 kg    Exam:  GEN: NAD SKIN: Warm and dry EYES: EOMI ENT: MMM CV: RRR PULM: CTA B ABD: soft, obese, epigastric and periumbilical tenderness, no rebound tenderness or guarding, +BS CNS: AAO x 3, non focal EXT: Chronic left leg lymphedema with tenderness, no erythema    Pressure Injury 06/29/21 Right Stage 1 -  Intact skin with non-blanchable redness of a localized area usually over a bony prominence. abrasion on right gluteal fold (Active)  06/29/21 1500  Location:   Location Orientation: Right  Staging: Stage 1 -  Intact skin with non-blanchable redness of a localized area usually over a bony prominence.  Wound Description (Comments): abrasion on right gluteal fold  Present on Admission: Yes     Data Reviewed:   I have personally reviewed following labs and imaging studies:  Labs: Labs show the following:   Basic Metabolic Panel: Recent Labs  Lab 10/17/22 1139 10/17/22 1218 10/19/22 1847 10/20/22 0423  NA 140  --  137 141  K 3.3*  --  3.0* 2.9*  CL 98  --  100 101  CO2 33*  --  28 32  GLUCOSE 125*  --  98 107*  BUN 26*  --  24* 23  CREATININE 1.14 1.30* 1.05* 0.96  CALCIUM 9.0  --  8.6* 8.7*  MG  --   --   --  1.9  PHOS  --   --   --  3.3   GFR Estimated Creatinine Clearance: 47.2 mL/min (by C-G formula based on SCr of 0.96 mg/dL). Liver Function Tests: Recent Labs  Lab 10/17/22 1139 10/19/22 1847 10/20/22 0423  AST 13 16 14*  ALT 9 12 12   ALKPHOS 114 98 98  BILITOT 0.4 0.6 0.8  PROT 7.3 6.7 6.6  ALBUMIN 4.2 3.7 3.8   Recent Labs  Lab 10/17/22 1139 10/19/22 1847  LIPASE 153.0* 54*   No results for input(s): "AMMONIA" in the last 168 hours. Coagulation profile Recent Labs  Lab 10/20/22 0052  INR 1.2    CBC: Recent Labs  Lab 10/17/22 1139 10/19/22 1847 10/20/22 0423  WBC 6.4 6.5 6.6  NEUTROABS 4.2  --   --   HGB  10.1* 9.5* 9.1*  HCT 32.1* 31.9* 30.4*  MCV 88.6 91.9 92.7  PLT 187.0 189 168   Cardiac Enzymes: No results for input(s): "CKTOTAL", "CKMB", "CKMBINDEX", "TROPONINI" in the last 168 hours. BNP (last 3 results) No results for input(s): "PROBNP" in the last 8760  hours. CBG: No results for input(s): "GLUCAP" in the last 168 hours. D-Dimer: No results for input(s): "DDIMER" in the last 72 hours. Hgb A1c: No results for input(s): "HGBA1C" in the last 72 hours. Lipid Profile: No results for input(s): "CHOL", "HDL", "LDLCALC", "TRIG", "CHOLHDL", "LDLDIRECT" in the last 72 hours. Thyroid function studies: No results for input(s): "TSH", "T4TOTAL", "T3FREE", "THYROIDAB" in the last 72 hours.  Invalid input(s): "FREET3" Anemia work up: No results for input(s): "VITAMINB12", "FOLATE", "FERRITIN", "TIBC", "IRON", "RETICCTPCT" in the last 72 hours. Sepsis Labs: Recent Labs  Lab 10/17/22 1139 10/19/22 1847 10/20/22 0423  WBC 6.4 6.5 6.6    Microbiology No results found for this or any previous visit (from the past 240 hour(s)).  Procedures and diagnostic studies:  No results found.             LOS: 1 day   Eldin Bonsell  [facility]   Pager on [email or web address]. If 7PM-7AM, please contact night-coverage at [email or web address]     10/20/2022, 3:07 PM

## 2022-10-20 NOTE — Progress Notes (Deleted)
ANTICOAGULATION CONSULT NOTE - Initial Consult  Pharmacy Consult for Heparin  Indication: atrial fibrillation/bilateral DVT   Allergies  Allergen Reactions   Aspirin Anaphylaxis and Hives    Pt states that she is unable to take because she had bleeding in her brain   Other Other (See Comments)    Pt states that she is unable to take blood thinners because she had bleeding in her brain; Blood Thinners-per doctor at [facility] (Xarelto)    Patient Measurements: Height: 5' (152.4 cm) Weight: 88.9 kg (196 lb) IBW/kg (Calculated) : 45.5 Heparin Dosing Weight: 66.5 kg   Vital Signs: BP: 139/69 (04/29 1430) Pulse Rate: 61 (04/29 1430)  Labs: Recent Labs    10/19/22 1847 10/20/22 0052 10/20/22 0423 10/20/22 0814 10/20/22 1547  HGB 9.5*  --  9.1*  --   --   HCT 31.9*  --  30.4*  --   --   PLT 189  --  168  --   --   APTT  --  29  --  78* 79*  LABPROT  --  14.6  --   --   --   INR  --  1.2  --   --   --   HEPARINUNFRC  --  >1.10*  --   --   --   CREATININE 1.05*  --  0.96  --   --      Estimated Creatinine Clearance: 47.2 mL/min (by C-G formula based on SCr of 0.96 mg/dL).   Medical History: Past Medical History:  Diagnosis Date   Arthritis    Atrial fibrillation, persistent (HCC)    a. s/p TEE-guided DCCV on 08/20/2017; b. 06/2021 Recurrent Afib-->amio/TEE/DCCV.   CAD (coronary artery disease)    a. 01/2012 Cath: nonobs dzs; b. 04/2014 MV: No ischemia; c. 09/2017 Cath: Minimal nonobs RCA dzs, otw nl cors.   Cardiorenal syndrome    a. 09/2017 Creat rose to 2.7 w/ diuresis-->HD x 2.   Chronic back pain    Chronic combined systolic (congestive) and diastolic (congestive) heart failure (HCC)    a. Dx 2005 @ [facility];  b. 02/2014 Echo: EF 55-60%; c. 07/2017: EF 30-35%; d. 09/2017 Echo: EF 40-45%; e. 02/2018 Echo: EF 55-60%; f. 08/2019 Echo: EF 60-65%; g. 08/2020 RHC: RA 3, RV 30/1, PA 33/11 (20), PCWP 6; f. 08/2021 Echo: EF 60-65%, no rwma, GrIDD, nl RV fxn, RVSP 40.27mmHg, mod dil LA, mod  TR.   Chronic respiratory failure (HCC)    a. on home O2   Gastritis    History of intracranial hemorrhage    a. 6/14 described by neurosurgery as "small frontal posttraumatic SAH " - pt was on xarelto @ the time.   Hyperlipidemia    Hypertension    Interstitial lung disease (HCC)    Lipoma of back    Lymphedema    a. Chronic LLE edema.   NICM (nonischemic cardiomyopathy) (HCC)    a. 09/2017 Echo: EF 40-45%; b. 09/2017 Cath: minor, insignificant CAD; c. 02/2018 Echo: EF 55-60%, Gr1 DD; d. 08/2019 Echo: EF 60-65%, Gr2 DD.   Obesity    PAH (pulmonary artery hypertension) (HCC)    a. 09/2017 Echo: PASP ; b. 09/2017 RHC: RA 23, RV 84/13, PA 84/29, PCWP 20, CO 3.89, CI 1.89. LVEDP 18; c. 08/2020 RHC: RA 3, RV 30/1, PA 33/11 (20), PCWP 6. CO/CI 6.18/3.36. PVR 2.3 WU.   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  06/2021. Veg on PPM lead. Not candidate for extraction-->s/p 6 wks abx.   Sepsis with metabolic encephalopathy (HCC)    Sleep apnea    Streptococcal bacteremia    Stroke [facility]) 2014   Subarachnoid hemorrhage (HCC) 01/19/2013   Symptomatic bradycardia    a. s/p BSX PPM.   Thyroid nodule    Type II diabetes mellitus (HCC)    Urinary incontinence     Medications:  (Not in a hospital admission)  Assessment: Pharmacy consulted to dose heparin in this 77 year old female admitted with abdominal pain, on Eliquis 5 mg PO BID PTA for chronic bilateral DVT's, Afib.  Last dose of Eliquis previous week.  Plan for EGD test on 4/30.   CrCl = 43.1 ml/min   Baseline HL = > 1.10   4/29 0814 aPTT 78, therapeutic x1 @ 1000 units/hr 4/29 1547 aPTT 79  Goal of Therapy:  Heparin level 0.3-0.7 units/ml aPTT 66 - 102 seconds Monitor platelets by anticoagulation protocol: Yes   Plan:  Heparin remains therapeutic Continue heparin drip at 1000 units/hr.  Use aPTT to dose until correlating with HL. Heparin to stop 4/30 @ 0500 for  procedure F/u post procedure anticoagulation plans   Sharen Hones, PharmD, BCPS Clinical Pharmacist   10/20/2022,4:54 PM

## 2022-10-21 ENCOUNTER — Encounter
Admission: EM | Disposition: A | Discharge status: Home / Self Care | Payer: Self-pay | Source: Home / Self Care | Attending: Internal Medicine

## 2022-10-21 ENCOUNTER — Other Ambulatory Visit: Payer: Self-pay | Admitting: Family Medicine

## 2022-10-21 ENCOUNTER — Inpatient Hospital Stay: Payer: Medicare HMO | Admitting: Certified Registered"

## 2022-10-21 ENCOUNTER — Encounter: Payer: Self-pay | Admitting: Internal Medicine

## 2022-10-21 DIAGNOSIS — I5032 Chronic diastolic (congestive) heart failure: Secondary | ICD-10-CM | POA: Diagnosis not present

## 2022-10-21 DIAGNOSIS — I4821 Permanent atrial fibrillation: Secondary | ICD-10-CM | POA: Diagnosis not present

## 2022-10-21 DIAGNOSIS — R1010 Upper abdominal pain, unspecified: Secondary | ICD-10-CM | POA: Diagnosis not present

## 2022-10-21 DIAGNOSIS — N83201 Unspecified ovarian cyst, right side: Secondary | ICD-10-CM

## 2022-10-21 HISTORY — PX: ESOPHAGOGASTRODUODENOSCOPY (EGD) WITH PROPOFOL: SHX5813

## 2022-10-21 LAB — CBC
HCT: 31.1 % — ABNORMAL LOW (ref 36.0–46.0)
Hemoglobin: 9.2 g/dL — ABNORMAL LOW (ref 12.0–15.0)
MCH: 27.4 pg (ref 26.0–34.0)
MCHC: 29.6 g/dL — ABNORMAL LOW (ref 30.0–36.0)
MCV: 92.6 fL (ref 80.0–100.0)
Platelets: 168 10*3/uL (ref 150–400)
RBC: 3.36 MIL/uL — ABNORMAL LOW (ref 3.87–5.11)
RDW: 12.1 % (ref 11.5–15.5)
WBC: 5.1 10*3/uL (ref 4.0–10.5)
nRBC: 0 % (ref 0.0–0.2)

## 2022-10-21 LAB — BASIC METABOLIC PANEL
Anion gap: 8 (ref 5–15)
BUN: 19 mg/dL (ref 8–23)
CO2: 29 mmol/L (ref 22–32)
Calcium: 9 mg/dL (ref 8.9–10.3)
Chloride: 101 mmol/L (ref 98–111)
Creatinine, Ser: 0.94 mg/dL (ref 0.44–1.00)
GFR, Estimated: >60 mL/min (ref >60)
Glucose, Bld: 120 mg/dL — ABNORMAL HIGH (ref 70–99)
Potassium: 3.9 mmol/L (ref 3.5–5.1)
Sodium: 138 mmol/L (ref 135–145)

## 2022-10-21 LAB — HEPATIC FUNCTION PANEL
ALT: 13 U/L (ref 0–44)
AST: 16 U/L (ref 15–41)
Albumin: 3.7 g/dL (ref 3.5–5.0)
Alkaline Phosphatase: 102 U/L (ref 38–126)
Bilirubin, Direct: 0.1 mg/dL (ref 0.0–0.2)
Indirect Bilirubin: 0.5 mg/dL (ref 0.3–0.9)
Total Bilirubin: 0.6 mg/dL (ref 0.3–1.2)
Total Protein: 6.7 g/dL (ref 6.5–8.1)

## 2022-10-21 LAB — MAGNESIUM: Magnesium: 1.9 mg/dL (ref 1.7–2.4)

## 2022-10-21 SURGERY — ESOPHAGOGASTRODUODENOSCOPY (EGD) WITH PROPOFOL
Anesthesia: General

## 2022-10-21 MED ORDER — ROSUVASTATIN CALCIUM 10 MG PO TABS
10.0000 mg | ORAL_TABLET | Freq: Every day | ORAL | Status: DC
  Administered 2022-10-22 – 2022-10-26 (×5): 10 mg via ORAL
  Filled 2022-10-21 (×5): qty 1

## 2022-10-21 MED ORDER — METOLAZONE 2.5 MG PO TABS: 2.5000 mg | ORAL_TABLET | ORAL | Status: DC

## 2022-10-21 MED ORDER — SPIRONOLACTONE 25 MG PO TABS
25.0000 mg | ORAL_TABLET | Freq: Every day | ORAL | Status: DC
  Administered 2022-10-22: 25 mg via ORAL
  Filled 2022-10-21: qty 1

## 2022-10-21 MED ORDER — HEPARIN (PORCINE) 25000 UT/250ML-% IV SOLN
1000.0000 [IU]/h | INTRAVENOUS | Status: AC
  Administered 2022-10-21: 1000 [IU]/h via INTRAVENOUS
  Filled 2022-10-21: qty 250

## 2022-10-21 MED ORDER — PROPOFOL 10 MG/ML IV BOLUS
INTRAVENOUS | Status: AC
  Filled 2022-10-21: qty 20

## 2022-10-21 MED ORDER — PROPOFOL 500 MG/50ML IV EMUL
INTRAVENOUS | Status: DC | PRN
  Administered 2022-10-21: 120 ug/kg/min via INTRAVENOUS

## 2022-10-21 MED ORDER — POTASSIUM CHLORIDE CRYS ER 20 MEQ PO TBCR
40.0000 meq | EXTENDED_RELEASE_TABLET | Freq: Every day | ORAL | Status: DC
  Administered 2022-10-22: 40 meq via ORAL
  Filled 2022-10-21: qty 2

## 2022-10-21 MED ORDER — PEG 3350-KCL-NA BICARB-NACL 420 G PO SOLR
4000.0000 mL | Freq: Once | ORAL | Status: AC
  Administered 2022-10-21: 4000 mL via ORAL
  Filled 2022-10-21: qty 4000

## 2022-10-21 MED ORDER — PEG 3350-KCL-NA BICARB-NACL 420 G PO SOLR: 2000.0000 mL | Freq: Once | ORAL | Status: DC | PRN

## 2022-10-21 MED ORDER — SILDENAFIL CITRATE 20 MG PO TABS
20.0000 mg | ORAL_TABLET | Freq: Three times a day (TID) | ORAL | Status: DC
  Administered 2022-10-21 – 2022-10-26 (×14): 20 mg via ORAL
  Filled 2022-10-21 (×15): qty 1

## 2022-10-21 MED ORDER — LIDOCAINE 2% (20 MG/ML) 5 ML SYRINGE
INTRAMUSCULAR | Status: DC | PRN
  Administered 2022-10-21: 20 mg via INTRAVENOUS

## 2022-10-21 MED ORDER — GLYCOPYRROLATE 0.2 MG/ML IJ SOLN
INTRAMUSCULAR | Status: DC | PRN
  Administered 2022-10-21: .2 mg via INTRAVENOUS

## 2022-10-21 MED ORDER — PROPOFOL 10 MG/ML IV BOLUS
INTRAVENOUS | Status: DC | PRN
  Administered 2022-10-21: 30 mg via INTRAVENOUS
  Administered 2022-10-21: 70 mg via INTRAVENOUS

## 2022-10-21 NOTE — Progress Notes (Signed)
Pt completed colonoscopy preparation and tolerated well. Will continue to monitor.  Update 0120: Pharmacy messaged RN staff about APTT scheduled to be drawn at 0000 but was not drawn yet. Laboratory staff was called and made aware and states " lab tech came to draw patient but have a hard time sticking patient, but the lab tech will be back again to draw the lab. Pharmacy made aware. Will continue to monitor.

## 2022-10-21 NOTE — TOC CM/SW Note (Addendum)
  Transition of Care [facility]) Screening Note   Patient Details  Name: Debra Saunders Date of Birth: 03-28-1946   Transition of Care [facility]) CM/SW Contact:    Margarito Liner, LCSW Phone Number: 10/21/2022, 11:36 AM    Transition of Care Department [facility]) has reviewed patient and no TOC needs have been identified at this time. We will continue to monitor patient advancement through interdisciplinary progression rounds. If new patient transition needs arise, please place a TOC consult.     11:48 pm: SDOH flag for transportation needs. Added resources to AVS.  Charlynn Court, CSW [phone]

## 2022-10-21 NOTE — Progress Notes (Addendum)
ANTICOAGULATION CONSULT NOTE   Pharmacy Consult for IV Heparin  Indication: atrial fibrillation / Hx bilateral DVT   Patient Measurements: Height: 5' (152.4 cm) Weight: 89 kg (196 lb 3.4 oz) IBW/kg (Calculated) : 45.5 Heparin Dosing Weight: 66.5 kg   Labs: Recent Labs    10/19/22 1847 10/20/22 0052 10/20/22 0423 10/20/22 0814 10/20/22 1547 10/21/22 0404  HGB 9.5*  --  9.1*  --   --  9.2*  HCT 31.9*  --  30.4*  --   --  31.1*  PLT 189  --  168  --   --  168  APTT  --  29  --  78* 79*  --   LABPROT  --  14.6  --   --   --   --   INR  --  1.2  --   --   --   --   HEPARINUNFRC  --  >1.10*  --   --   --   --   CREATININE 1.05*  --  0.96  --   --  0.94    Estimated Creatinine Clearance: 48.2 mL/min (by C-G formula based on SCr of 0.94 mg/dL).  Medical History: Past Medical History:  Diagnosis Date   Arthritis    Atrial fibrillation, persistent (HCC)    a. s/p TEE-guided DCCV on 08/20/2017; b. 06/2021 Recurrent Afib-->amio/TEE/DCCV.   CAD (coronary artery disease)    a. 01/2012 Cath: nonobs dzs; b. 04/2014 MV: No ischemia; c. 09/2017 Cath: Minimal nonobs RCA dzs, otw nl cors.   Cardiorenal syndrome    a. 09/2017 Creat rose to 2.7 w/ diuresis-->HD x 2.   Chronic back pain    Chronic combined systolic (congestive) and diastolic (congestive) heart failure (HCC)    a. Dx 2005 @ [facility];  b. 02/2014 Echo: EF 55-60%; c. 07/2017: EF 30-35%; d. 09/2017 Echo: EF 40-45%; e. 02/2018 Echo: EF 55-60%; f. 08/2019 Echo: EF 60-65%; g. 08/2020 RHC: RA 3, RV 30/1, PA 33/11 (20), PCWP 6; f. 08/2021 Echo: EF 60-65%, no rwma, GrIDD, nl RV fxn, RVSP 40.65mmHg, mod dil LA, mod TR.   Chronic respiratory failure (HCC)    a. on home O2   Gastritis    History of intracranial hemorrhage    a. 6/14 described by neurosurgery as "small frontal posttraumatic SAH " - pt was on xarelto @ the time.   Hyperlipidemia    Hypertension    Interstitial lung disease (HCC)    Lipoma of back    Lymphedema    a. Chronic LLE  edema.   NICM (nonischemic cardiomyopathy) (HCC)    a. 09/2017 Echo: EF 40-45%; b. 09/2017 Cath: minor, insignificant CAD; c. 02/2018 Echo: EF 55-60%, Gr1 DD; d. 08/2019 Echo: EF 60-65%, Gr2 DD.   Obesity    PAH (pulmonary artery hypertension) (HCC)    a. 09/2017 Echo: PASP ; b. 09/2017 RHC: RA 23, RV 84/13, PA 84/29, PCWP 20, CO 3.89, CI 1.89. LVEDP 18; c. 08/2020 RHC: RA 3, RV 30/1, PA 33/11 (20), PCWP 6. CO/CI 6.18/3.36. PVR 2.3 WU.   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 06/2021. Veg on PPM lead. Not candidate for extraction-->s/p 6 wks abx.   Sepsis with metabolic encephalopathy (HCC)    Sleep apnea    Streptococcal bacteremia    Stroke [facility]) 2014   Subarachnoid hemorrhage (HCC) 01/19/2013   Symptomatic bradycardia    a. s/p BSX PPM.   Thyroid nodule  Type II diabetes mellitus (HCC)    Urinary incontinence     Assessment: Pharmacy consulted to dose heparin in this 77 year old female admitted with abdominal pain, on Eliquis 5 mg PO BID PTA for chronic bilateral DVT's, Afib.  Last dose of Eliquis 4/28.  Baseline HL > 1.10   EGD 4/30 Colonoscopy 5/1  0429 0814 aPTT 78, therapeutic x 1; 1000 un/hr 0429 1547 aPTT 79, therapeutic x 2; 1000 un/hr  Goal of Therapy:  Heparin level 0.3-0.7 units/ml aPTT 66 - 102 seconds Monitor platelets by anticoagulation protocol: Yes   Plan:  --Post EGD, re-start heparin at previous rate of 1000 units/hr --Will check aPTT and HL 8 hours from re-start. Continue to follow aPTT until correlation established with HL and then switch over to HL monitoring only --Daily CBC per protocol while on IV heparin --Heparin to be stopped 5/1 at 0630 for planned colonoscopy   Tressie Ellis 10/21/2022,2:41 PM

## 2022-10-21 NOTE — Anesthesia Postprocedure Evaluation (Signed)
Anesthesia Post Note  Patient: Debra Saunders  Procedure(s) Performed: ESOPHAGOGASTRODUODENOSCOPY (EGD) WITH PROPOFOL  Patient location during evaluation: Endoscopy Anesthesia Type: General Level of consciousness: awake and alert Pain management: pain level controlled Vital Signs Assessment: post-procedure vital signs reviewed and stable Respiratory status: spontaneous breathing, nonlabored ventilation, respiratory function stable and patient connected to nasal cannula oxygen Cardiovascular status: blood pressure returned to baseline and stable Postop Assessment: no apparent nausea or vomiting Anesthetic complications: no   No notable events documented.   Last Vitals:  Vitals:   10/21/22 1400 10/21/22 1410  BP: (!) 112/44 107/60  Pulse:  (!) 114  Resp:  17  Temp:    SpO2:  95%    Last Pain:  Vitals:   10/21/22 1350  TempSrc: Temporal  PainSc: Asleep                 Corinda Gubler

## 2022-10-21 NOTE — Interval H&P Note (Signed)
History and Physical Interval Note: Consult note 10/21/22 was reviewed and there was no interval change after seeing and examining the patient. Still with abdominal pain. Labs were reviewed and stable. NPO since midnight.  Written consent was obtained from the patient after discussion of risks, benefits, and alternatives. Patient has consented to proceed with Esophagogastroduodenoscopy with possible intervention  Esophagogastroduodenoscopy with possible biopsy, control of bleeding, polypectomy, and interventions as necessary has been discussed with the patient/patient representative. Informed consent was obtained from the patient/patient representative after explaining the indication, nature, and risks of the procedure including but not limited to death, bleeding, perforation, missed neoplasm/lesions, cardiorespiratory compromise, and reaction to medications. Opportunity for questions was given and appropriate answers were provided. Patient/patient representative has verbalized understanding is amenable to undergoing the procedure.    10/21/2022 1:25 PM  Debra Saunders  has presented today for surgery, with the diagnosis of abdominal pain, abnormal CT scan.  The various methods of treatment have been discussed with the patient and family. After consideration of risks, benefits and other options for treatment, the patient has consented to  Procedure(s): ESOPHAGOGASTRODUODENOSCOPY (EGD) WITH PROPOFOL (N/A) as a surgical intervention.  The patient's history has been reviewed, patient examined, no change in status, stable for surgery.  I have reviewed the patient's chart and labs.  Questions were answered to the patient's satisfaction.     Jaynie Collins

## 2022-10-21 NOTE — Plan of Care (Signed)

## 2022-10-21 NOTE — Op Note (Signed)
[facility] Patient Name: Debra Saunders Procedure Date: 10/21/2022 1:10 PM MRN: [id number] Account #: [id number] Date of Birth: 1945-12-04 Admit Type: Inpatient Age: 77 Room: [facility] ENDO ROOM 1 Gender: Female Note Status: Finalized Instrument Name: Laurette Schimke [id number] Procedure:             Upper GI endoscopy Indications:           Abnormal CT of the GI tract Providers:             Trenda Moots, DO Referring MD:          Yehuda Mao. Birdie Sons (Referring MD) Medicines:             Monitored Anesthesia Care Complications:         No immediate complications. Estimated blood loss:                         Minimal. Procedure:             Pre-Anesthesia Assessment:                        - Prior to the procedure, a History and Physical was                         performed, and patient medications and allergies were                         reviewed. The patient is competent. The risks and                         benefits of the procedure and the sedation options and                         risks were discussed with the patient. All questions                         were answered and informed consent was obtained.                         Patient identification and proposed procedure were                         verified by the physician, the nurse, the anesthetist                         and the technician in the endoscopy suite. Mental                         Status Examination: alert and oriented. Airway                         Examination: normal oropharyngeal airway and neck                         mobility. Respiratory Examination: clear to                         auscultation. CV Examination: RRR, no murmurs, no S3  or S4. Prophylactic Antibiotics: The patient does not                         require prophylactic antibiotics. Prior                         Anticoagulants: The patient has taken Eliquis                          (apixaban), last dose was 2 days prior to procedure.                         After reviewing the risks and benefits, the patient                         was deemed in satisfactory condition to undergo the                         procedure. The anesthesia plan was to use monitored                         anesthesia care (MAC). Immediately prior to                         administration of medications, the patient was                         re-assessed for adequacy to receive sedatives. The                         heart rate, respiratory rate, oxygen saturations,                         blood pressure, adequacy of pulmonary ventilation, and                         response to care were monitored throughout the                         procedure. The physical status of the patient was                         re-assessed after the procedure.                        After obtaining informed consent, the endoscope was                         passed under direct vision. Throughout the procedure,                         the patient's blood pressure, pulse, and oxygen                         saturations were monitored continuously. The Endoscope                         was introduced through the mouth, and advanced to the  second part of duodenum. The upper GI endoscopy was                         accomplished without difficulty. The patient tolerated                         the procedure well. Findings:      The duodenal bulb, first portion of the duodenum and second portion of       the duodenum were normal. Estimated blood loss: none.      Small bowel appears to be telescoping through pylorus, which may be       etiology of patient's discomfort. no signs of ulcer or tumor when       visualized. Biopsies were taken with a cold forceps for histology.       Biopsies were taken with a cold forceps for Helicobacter pylori testing.       Estimated blood loss was  minimal.      The exam of the stomach was otherwise normal.      The Z-line was regular. Estimated blood loss: none.      Esophagogastric landmarks were identified: the gastroesophageal junction       was found at 36 cm from the incisors.      The examined esophagus was normal. Estimated blood loss: none. Impression:            - Normal duodenal bulb, first portion of the duodenum                         and second portion of the duodenum.                        - Z-line regular.                        - Esophagogastric landmarks identified.                        - Normal esophagus. Recommendation:        - Patient has a contact number available for                         emergencies. The signs and symptoms of potential                         delayed complications were discussed with the patient.                         Return to normal activities tomorrow. Written                         discharge instructions were provided to the patient.                        - Discharge patient to home.                        - Clear liquid diet.                        - Continue present medications.                        -  Await pathology results.                        - Plan for colonoscopy and possible video capsule                         endoscopy tomorrow.                        Start golytely prep today                        labs in am                        npo at midnight- ok to drink prep after npo order                        - The findings and recommendations were discussed with                         the patient's family.                        - The findings and recommendations were discussed with                         the patient.                        - The findings and recommendations were discussed with                         the referring physician. Procedure Code(s):     --- Professional ---                        [phone], Esophagogastroduodenoscopy, flexible,                          transoral; with biopsy, single or multiple Diagnosis Code(s):     --- Professional ---                        R93.3, Abnormal findings on diagnostic imaging of                         other parts of digestive tract CPT copyright 2022 American Medical Association. All rights reserved. The codes documented in this report are preliminary and upon coder review may  be revised to meet current compliance requirements. Attending Participation:      I personally performed the entire procedure. Elfredia Nevins, DO Jaynie Collins DO, DO 10/21/2022 1:58:39 PM This report has been signed electronically. Number of Addenda: 0 Note Initiated On: 10/21/2022 1:10 PM Estimated Blood Loss:  Estimated blood loss was minimal.      [facility]

## 2022-10-21 NOTE — Anesthesia Preprocedure Evaluation (Addendum)
Anesthesia Evaluation  Patient identified by MRN, date of birth, ID band Patient awake  General Assessment Comment:Patient had vomiting two weeks ago, none since then. Has had persistent abdominal pain since then.  Reviewed: Allergy & Precautions, NPO status , Patient's Chart, lab work & pertinent test results  History of Anesthesia Complications Negative for: history of anesthetic complications  Airway Mallampati: II  TM Distance: >3 FB Neck ROM: Full    Dental no notable dental hx. (+) Teeth Intact   Pulmonary sleep apnea, Continuous Positive Airway Pressure Ventilation and Oxygen sleep apnea , neg COPD, Patient abstained from smoking.Not current smoker, former smoker 4L/min O2 at baseline    + decreased breath sounds      Cardiovascular Exercise Tolerance: Poor METShypertension, pulmonary hypertension+ CAD and +CHF  (-) Past MI (-) dysrhythmias + pacemaker  Rhythm:Regular Rate:Normal - Systolic murmurs TTE 1. Left ventricular ejection fraction, by estimation, is 60 to 65%. The  left ventricle has normal function. The left ventricle has no regional  wall motion abnormalities. There is mild left ventricular hypertrophy.  Left ventricular diastolic parameters  are consistent with Grade I diastolic dysfunction (impaired relaxation).   2. Right ventricular systolic function is normal. The right ventricular  size is normal. There is mildly elevated pulmonary artery systolic  pressure. The estimated right ventricular systolic pressure is 40.3 mmHg.   3. Left atrial size was moderately dilated.   4. The mitral valve is normal in structure. No evidence of mitral valve  regurgitation. No evidence of mitral stenosis.   5. Tricuspid valve regurgitation is moderate.   6. The aortic valve is normal in structure. Aortic valve regurgitation is  not visualized. Aortic valve sclerosis/calcification is present, without  any evidence of aortic  stenosis.   7. The inferior vena cava is normal in size with greater than 50%  respiratory variability, suggesting right atrial pressure of 3 mmHg.   8. RV pacer wire difficult to visualize, grossly no vegetation noted.     Neuro/Psych CVA, No Residual Symptoms  negative psych ROS   GI/Hepatic ,neg GERD  ,,(+)     (-) substance abuse    Endo/Other  diabetes    Renal/GU CRFRenal disease     Musculoskeletal   Abdominal  (+) + obese  Peds  Hematology   Anesthesia Other Findings Past Medical History: No date: Arthritis No date: Atrial fibrillation, persistent (HCC)     Comment:  a. s/p TEE-guided DCCV on 08/20/2017; b. 06/2021 Recurrent              Afib-->amio/TEE/DCCV. No date: CAD (coronary artery disease)     Comment:  a. 01/2012 Cath: nonobs dzs; b. 04/2014 MV: No ischemia;               c. 09/2017 Cath: Minimal nonobs RCA dzs, otw nl cors. No date: Cardiorenal syndrome     Comment:  a. 09/2017 Creat rose to 2.7 w/ diuresis-->HD x 2. No date: Chronic back pain No date: Chronic combined systolic (congestive) and diastolic  (congestive) heart failure (HCC)     Comment:  a. Dx 2005 @ [facility];  b. 02/2014 Echo: EF 55-60%; c.               07/2017: EF 30-35%; d. 09/2017 Echo: EF 40-45%; e. 02/2018               Echo: EF 55-60%; f. 08/2019 Echo: EF 60-65%; g. 08/2020                 RHC: RA 3, RV 30/1, PA 33/11 (20), PCWP 6; f. 08/2021               Echo: EF 60-65%, no rwma, GrIDD, nl RV fxn, RVSP               40.3mmHg, mod dil LA, mod TR. No date: Chronic respiratory failure (HCC)     Comment:  a. on home O2 No date: Gastritis No date: History of intracranial hemorrhage     Comment:  a. 6/14 described by neurosurgery as "small frontal               posttraumatic SAH " - pt was on xarelto @ the time. No date: Hyperlipidemia No date: Hypertension No date: Interstitial lung disease (HCC) No date: Lipoma of back No date: Lymphedema     Comment:  a. Chronic LLE edema. No date: NICM  (nonischemic cardiomyopathy) (HCC)     Comment:  a. 09/2017 Echo: EF 40-45%; b. 09/2017 Cath: minor,               insignificant CAD; c. 02/2018 Echo: EF 55-60%, Gr1 DD; d.               08/2019 Echo: EF 60-65%, Gr2 DD. No date: Obesity No date: PAH (pulmonary artery hypertension) (HCC)     Comment:  a. 09/2017 Echo: PASP 65mmHg; b. 09/2017 RHC: RA 23, RV               84/13, PA 84/29, PCWP 20, CO 3.89, CI 1.89. LVEDP 18; c.               08/2020 RHC: RA 3, RV 30/1, PA 33/11 (20), PCWP 6. CO/CI               6.18/3.36. PVR 2.3 WU. 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 06/2021. Veg on PPM lead. Not candidate for               extraction-->s/p 6 wks abx. No date: Sepsis with metabolic encephalopathy (HCC) No date: Sleep apnea No date: Streptococcal bacteremia 2014: Stroke (HCC) 01/19/2013: Subarachnoid hemorrhage (HCC) No date: Symptomatic bradycardia     Comment:  a. s/p BSX PPM. No date: Thyroid nodule No date: Type II diabetes mellitus (HCC) No date: Urinary incontinence  Reproductive/Obstetrics                              Anesthesia Physical Anesthesia Plan  ASA: 3  Anesthesia Plan: General   Post-op Pain Management: Minimal or no pain anticipated   Induction: Intravenous  PONV Risk Score and Plan: 3 and Propofol infusion, TIVA and Ondansetron  Airway Management Planned: Nasal Cannula  Additional Equipment: None  Intra-op Plan:   Post-operative Plan:   Informed Consent: I have reviewed the patients History and Physical, chart, labs and discussed the procedure including the risks, benefits and alternatives for the proposed anesthesia with the patient or authorized representative who has indicated his/her understanding and acceptance.     Dental advisory given  Plan Discussed with: CRNA and Surgeon  Anesthesia Plan Comments: (Discussed risks of anesthesia with  patient, including possibility of difficulty with spontaneous ventilation under anesthesia necessitating airway intervention, PONV, and rare risks such as cardiac or respiratory or neurological events, and allergic reactions. Discussed the role of CRNA in patient's perioperative care. Patient understands.)           Anesthesia Quick Evaluation  

## 2022-10-21 NOTE — Transfer of Care (Signed)
Immediate Anesthesia Transfer of Care Note  Patient: Debra Saunders  Procedure(s) Performed: ESOPHAGOGASTRODUODENOSCOPY (EGD) WITH PROPOFOL  Patient Location: Endoscopy Unit  Anesthesia Type:General  Level of Consciousness: drowsy  Airway & Oxygen Therapy: Patient Spontanous Breathing  Post-op Assessment: Report given to RN and Post -op Vital signs reviewed and stable  Post vital signs: Reviewed  Last Vitals:  Vitals Value Taken Time  BP 120/98 10/21/22 1350  Temp 35.7 C 10/21/22 1350  Pulse 67 10/21/22 1350  Resp 16 10/21/22 1350  SpO2 97 % 10/21/22 1350    Last Pain:  Vitals:   10/21/22 1350  TempSrc:   PainSc: Asleep         Complications: No notable events documented.

## 2022-10-21 NOTE — Care Plan (Signed)
Brief GI Postop note  Upper endoscopy relatively unremarkable.  The small bowel did appear to be telescoping through the pylorus which may explain some of the patient's intermittent discomfort.  This area was also biopsied, but no signs of malignancy or ulceration were appreciated.  Will plan for colonoscopy and possible video capsule endoscopy tomorrow to rule out other sources of abdominal pain.  Clear liquids today.  N.p.o. at midnight GoLytely prep to start today.  Patient okay to drink GoLytely colonoscopy prep after midnight Heparin to be shut off 6 hours prior to procedure Labs in a.m.  Patient's daughter was updated by phone Hospitalist also updated  Enis Slipper, DO [facility]

## 2022-10-21 NOTE — Discharge Instructions (Signed)
Transportation Resources Agency Name: [facility] [employer] Agency Address: [address] [place], [place] [address] Phone: [phone] Email: [email or web address] .[email or web address] Website: [email or web address].[email or web address].org Service(s) Offered: [employer], self-sufficiency, congregate meal  program, weatherization program, Field seismologist program, emergency food assistance,  housing counseling, home ownership program, wheels-towork program. October 16, 2016 22  Agency Name: [facility] [employer] [id number]) Address: [address], [place], [place] [address] Phone: [phone] Email:  Website: www.acta-[place].com Service(s) Offered: Transportation for general public, subscription and demand  response; Dial-a-Ride for citizens 32 years of age or older. Agency Name: Department of Social Services Address: [address] [place], [place] [address] Phone: [phone] Service(s) Offered: Child support services; child welfare services; food stamps;  Medicaid; work first family assistance; and aid with fuel,  rent, food and medicine, transportation assistance.  Agency Name: [employer]  Network Address: Phone: [phone] Service(s) Offered: Transports veterans to the [facility] medical center. Call  forty-eight hours in advance and leave the name, telephone  number, date, and time of appointment. Veteran will be  contacted by the driver the day before the appointment to  arrange a pick up point

## 2022-10-21 NOTE — Progress Notes (Addendum)
Progress Note    Debra Saunders  ZOX:[id number] DOB: 24-Apr-1946  DOA: 10/19/2022 PCP: Glori Luis, MD      Brief Narrative:    Medical records reviewed and are as summarized below:  Debra Saunders is a 77 y.o. 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  She presented to the hospital with upper abdominal pain that is worse after meals.  This has been going on for at least 2 weeks.  She saw her PCP on 10/17/2022.  CT scan abdomen pelvis was concerning for "possible short segment intussusception or ulceration in the region of the pylorus".  She was advised to go to the emergency department for further management.      Assessment/Plan:   Principal Problem:   Abdominal pain Active Problems:   Pulmonary hypertension, unspecified (HCC)   Chronic diastolic CHF (congestive heart failure) (HCC)   Permanent atrial fibrillation (HCC)   Body mass index is 38.32 kg/m.  (Obesity)   Upper abdominal pain: CT scanning abdomen and pelvis concerning for ulcer in the region of the pylorus.  EGD on 10/21/2022 showed small bowel appears to be telescoping through the pylorus which could be the etiology of patient's abdominal pain.  Plan for colonoscopy and probable video capsule endoscopy tomorrow.  Continue IV Protonix.  Follow-up with gastroenterologist.   Hypokalemia: Improved   Permanent atrial fibrillation: Eliquis has been held.  Okay to restart IV heparin drip with stop date of 10/22/2022 and stop time of 6:30 AM for colonoscopy tomorrow.  CHA2DS2-VASc score is 8   History of left leg DVT in 2014 per patient: Eliquis on hold.   Chronic diastolic CHF, pulmonary  hypertension: Compensated.  She is on macitentan and selexipag.  Continue torsemide. 2D echo on 08/23/2021 showed EF estimated at 60 to 65%, grade 1 diastolic dysfunction.   Chronic hypoxic respiratory failure: She is on 4 L/min oxygen via Debra Saunders.   History of Streptococcus bacteremia and infected pacemaker on January 2023: She is on cefadroxil for chronic suppressive antibiotic therapy   Other comorbidities include OSA on CPAP at night, history of stroke, type II DM, anxiety, depression, history of chronic hypotension on midodrine, interstitial lung disease,    Diet Order             Diet NPO time specified Except for: Sips with Meds  Diet effective midnight                            Consultants: Gastroenterologist  Procedures: EGD    Medications:    cefadroxil  500 mg Oral BID   DULoxetine  60 mg Oral Daily   gabapentin  300 mg Oral QHS   macitentan  10 mg Oral Daily   midodrine  5 mg Oral BID WC   pantoprazole (PROTONIX) IV  40 mg Intravenous Q12H   Selexipag  1,600 mcg Oral BID   torsemide  40 mg Oral BID   Continuous Infusions:  sodium chloride       Anti-infectives (From admission, onward)    Start     Dose/Rate Route Frequency Ordered Stop   10/20/22 0000  cefadroxil (DURICEF) capsule 500 mg  500 mg Oral 2 times daily 10/19/22 2358                Family Communication/Anticipated D/C date and plan/Code Status   DVT prophylaxis:      Code Status: Full Code  Family Communication: None Disposition Plan: Plan to discharge home tomorrow   Status is: Inpatient Remains inpatient appropriate because: Upper abdominal pain, plan for endoscopic workup       Subjective:   She still has pain in the epigastric periumbilical area.  No vomiting or diarrhea.  No bloody stools.  Objective:    Vitals:   10/21/22 0333 10/21/22 0417 10/21/22 0513 10/21/22 0737  BP: 115/62  115/62 105/61  Pulse: (!) 59  (!) 59 63  Resp: 20   16   Temp: 98.1 F (36.7 C)  98.1 F (36.7 C) 97.9 F (36.6 C)  TempSrc:   Oral   SpO2: 100%  100% 100%  Weight:  89 kg    Height:       No data found.   Intake/Output Summary (Last 24 hours) at 10/21/2022 1610 Last data filed at 10/21/2022 0914 Gross per 24 hour  Intake 788.03 ml  Output 1100 ml  Net -311.97 ml   Filed Weights   10/19/22 1843 10/21/22 0417  Weight: 88.9 kg 89 kg    Exam:  GEN: NAD SKIN: Warm and dry EYES: No pallor or icterus ENT: MMM CV: RRR PULM: CTA B ABD: soft, obese, epigastric and periumbilical tenderness, no rebound tenderness or guarding, +BS CNS: AAO x 3, non focal EXT: Chronic left leg lymphedema with chronic tenderness, no erythema     Pressure Injury 06/29/21 Right Stage 1 -  Intact skin with non-blanchable redness of a localized area usually over a bony prominence. abrasion on right gluteal fold (Active)  06/29/21 1500  Location:   Location Orientation: Right  Staging: Stage 1 -  Intact skin with non-blanchable redness of a localized area usually over a bony prominence.  Wound Description (Comments): abrasion on right gluteal fold  Present on Admission: Yes     Data Reviewed:   I have personally reviewed following labs and imaging studies:  Labs: Labs show the following:   Basic Metabolic Panel: Recent Labs  Lab 10/17/22 1139 10/17/22 1218 10/19/22 1847 10/20/22 0423 10/21/22 0404  NA 140  --  137 141 138  K 3.3*  --  3.0* 2.9* 3.9  CL 98  --  100 101 101  CO2 33*  --  28 32 29  GLUCOSE 125*  --  98 107* 120*  BUN 26*  --  24* 23 19  CREATININE 1.14 1.30* 1.05* 0.96 0.94  CALCIUM 9.0  --  8.6* 8.7* 9.0  MG  --   --   --  1.9 1.9  PHOS  --   --   --  3.3  --    GFR Estimated Creatinine Clearance: 48.2 mL/min (by C-G formula based on SCr of 0.94 mg/dL). Liver Function Tests: Recent Labs  Lab 10/17/22 1139 10/19/22 1847 10/20/22 0423 10/21/22 0404  AST 13 16 14* 16  ALT 9 12 12 13   ALKPHOS 114 98 98 102   BILITOT 0.4 0.6 0.8 0.6  PROT 7.3 6.7 6.6 6.7  ALBUMIN 4.2 3.7 3.8 3.7   Recent Labs  Lab 10/17/22 1139 10/19/22 1847  LIPASE 153.0* 54*   No results for input(s): "AMMONIA" in the last 168 hours. Coagulation profile Recent Labs  Lab 10/20/22 0052  INR  1.2    CBC: Recent Labs  Lab 10/17/22 1139 10/19/22 1847 10/20/22 0423 10/21/22 0404  WBC 6.4 6.5 6.6 5.1  NEUTROABS 4.2  --   --   --   HGB 10.1* 9.5* 9.1* 9.2*  HCT 32.1* 31.9* 30.4* 31.1*  MCV 88.6 91.9 92.7 92.6  PLT 187.0 189 168 168   Cardiac Enzymes: No results for input(s): "CKTOTAL", "CKMB", "CKMBINDEX", "TROPONINI" in the last 168 hours. BNP (last 3 results) No results for input(s): "PROBNP" in the last 8760 hours. CBG: No results for input(s): "GLUCAP" in the last 168 hours. D-Dimer: No results for input(s): "DDIMER" in the last 72 hours. Hgb A1c: No results for input(s): "HGBA1C" in the last 72 hours. Lipid Profile: No results for input(s): "CHOL", "HDL", "LDLCALC", "TRIG", "CHOLHDL", "LDLDIRECT" in the last 72 hours. Thyroid function studies: No results for input(s): "TSH", "T4TOTAL", "T3FREE", "THYROIDAB" in the last 72 hours.  Invalid input(s): "FREET3" Anemia work up: No results for input(s): "VITAMINB12", "FOLATE", "FERRITIN", "TIBC", "IRON", "RETICCTPCT" in the last 72 hours. Sepsis Labs: Recent Labs  Lab 10/17/22 1139 10/19/22 1847 10/20/22 0423 10/21/22 0404  WBC 6.4 6.5 6.6 5.1    Microbiology No results found for this or any previous visit (from the past 240 hour(s)).  Procedures and diagnostic studies:  No results found.             LOS: 2 days   Kazimierz Springborn  [facility]   Pager on [email or web address]. If 7PM-7AM, please contact night-coverage at [email or web address]     10/21/2022, 9:23 AM

## 2022-10-22 ENCOUNTER — Inpatient Hospital Stay: Payer: Medicare HMO | Admitting: Anesthesiology

## 2022-10-22 ENCOUNTER — Encounter
Admission: EM | Disposition: A | Discharge status: Home / Self Care | Payer: Self-pay | Source: Home / Self Care | Attending: Internal Medicine

## 2022-10-22 ENCOUNTER — Encounter: Payer: Self-pay | Admitting: Gastroenterology

## 2022-10-22 DIAGNOSIS — R109 Unspecified abdominal pain: Secondary | ICD-10-CM | POA: Diagnosis not present

## 2022-10-22 DIAGNOSIS — R933 Abnormal findings on diagnostic imaging of other parts of digestive tract: Secondary | ICD-10-CM | POA: Diagnosis not present

## 2022-10-22 HISTORY — PX: COLONOSCOPY WITH PROPOFOL: SHX5780

## 2022-10-22 HISTORY — PX: GIVENS CAPSULE STUDY: SHX5432

## 2022-10-22 LAB — HEPARIN LEVEL (UNFRACTIONATED): Heparin Unfractionated: 0.45 IU/mL (ref 0.30–0.70)

## 2022-10-22 LAB — CBC
HCT: 33.8 % — ABNORMAL LOW (ref 36.0–46.0)
Hemoglobin: 10.2 g/dL — ABNORMAL LOW (ref 12.0–15.0)
MCH: 27.7 pg (ref 26.0–34.0)
MCHC: 30.2 g/dL (ref 30.0–36.0)
MCV: 91.8 fL (ref 80.0–100.0)
Platelets: 187 10*3/uL (ref 150–400)
RBC: 3.68 MIL/uL — ABNORMAL LOW (ref 3.87–5.11)
RDW: 12.1 % (ref 11.5–15.5)
WBC: 5.3 10*3/uL (ref 4.0–10.5)
nRBC: 0 % (ref 0.0–0.2)

## 2022-10-22 LAB — BASIC METABOLIC PANEL
Anion gap: 7 (ref 5–15)
BUN: 18 mg/dL (ref 8–23)
CO2: 29 mmol/L (ref 22–32)
Calcium: 9 mg/dL (ref 8.9–10.3)
Chloride: 100 mmol/L (ref 98–111)
Creatinine, Ser: 1.16 mg/dL — ABNORMAL HIGH (ref 0.44–1.00)
GFR, Estimated: 48 mL/min — ABNORMAL LOW (ref >60)
Glucose, Bld: 99 mg/dL (ref 70–99)
Potassium: 3.9 mmol/L (ref 3.5–5.1)
Sodium: 136 mmol/L (ref 135–145)

## 2022-10-22 LAB — APTT: aPTT: 69 seconds — ABNORMAL HIGH (ref 24–36)

## 2022-10-22 LAB — GLUCOSE, CAPILLARY: Glucose-Capillary: 74 mg/dL (ref 70–99)

## 2022-10-22 LAB — SURGICAL PATHOLOGY

## 2022-10-22 SURGERY — COLONOSCOPY WITH PROPOFOL
Anesthesia: General

## 2022-10-22 MED ORDER — PROPOFOL 10 MG/ML IV BOLUS
INTRAVENOUS | Status: DC | PRN
  Administered 2022-10-22: 40 mg via INTRAVENOUS

## 2022-10-22 MED ORDER — PROPOFOL 10 MG/ML IV BOLUS
INTRAVENOUS | Status: AC
  Filled 2022-10-22: qty 40

## 2022-10-22 MED ORDER — EPHEDRINE SULFATE (PRESSORS) 50 MG/ML IJ SOLN
INTRAMUSCULAR | Status: DC | PRN
  Administered 2022-10-22 (×4): 5 mg via INTRAVENOUS

## 2022-10-22 MED ORDER — DICYCLOMINE HCL 10 MG PO CAPS
10.0000 mg | ORAL_CAPSULE | Freq: Three times a day (TID) | ORAL | Status: DC
  Administered 2022-10-22: 10 mg via ORAL
  Filled 2022-10-22 (×3): qty 1

## 2022-10-22 MED ORDER — SODIUM CHLORIDE 0.9 % IV SOLN: INTRAVENOUS | Status: DC

## 2022-10-22 MED ORDER — HEPARIN (PORCINE) 25000 UT/250ML-% IV SOLN
1000.0000 [IU]/h | INTRAVENOUS | Status: AC
  Administered 2022-10-22: 1000 [IU]/h via INTRAVENOUS
  Filled 2022-10-22: qty 250

## 2022-10-22 MED ORDER — PROPOFOL 500 MG/50ML IV EMUL
INTRAVENOUS | Status: DC | PRN
  Administered 2022-10-22: 140 ug/kg/min via INTRAVENOUS

## 2022-10-22 MED ORDER — LIDOCAINE HCL (CARDIAC) PF 100 MG/5ML IV SOSY
PREFILLED_SYRINGE | INTRAVENOUS | Status: DC | PRN
  Administered 2022-10-22: 100 mg via INTRAVENOUS

## 2022-10-22 MED ORDER — PHENYLEPHRINE 80 MCG/ML (10ML) SYRINGE FOR IV PUSH (FOR BLOOD PRESSURE SUPPORT)
PREFILLED_SYRINGE | INTRAVENOUS | Status: DC | PRN
  Administered 2022-10-22 (×2): 80 ug via INTRAVENOUS
  Administered 2022-10-22: 160 ug via INTRAVENOUS
  Administered 2022-10-22: 80 ug via INTRAVENOUS

## 2022-10-22 NOTE — Transfer of Care (Signed)
Immediate Anesthesia Transfer of Care Note  Patient: Debra Saunders  Procedure(s) Performed: COLONOSCOPY WITH PROPOFOL GIVENS CAPSULE STUDY  Patient Location: Endoscopy Unit  Anesthesia Type:General  Level of Consciousness: drowsy  Airway & Oxygen Therapy: Patient Spontanous Breathing  Post-op Assessment: Report given to RN and Post -op Vital signs reviewed and stable  Post vital signs: Reviewed and stable  Last Vitals:  Vitals Value Taken Time  BP 121/47   Temp    Pulse 68   Resp 15   SpO2 98     Last Pain:  Vitals:   10/22/22 1449  TempSrc: Temporal  PainSc: 7       Patients Stated Pain Goal: 0 (10/22/22 5409)  Complications: No notable events documented.

## 2022-10-22 NOTE — Progress Notes (Signed)
ANTICOAGULATION CONSULT NOTE   Pharmacy Consult for IV Heparin  Indication: atrial fibrillation / Hx bilateral DVT   Patient Measurements: Height: 5' (152.4 cm) Weight: 89 kg (196 lb 3.4 oz) IBW/kg (Calculated) : 45.5 Heparin Dosing Weight: 66.5 kg   Labs: Recent Labs    10/20/22 0052 10/20/22 0423 10/20/22 0814 10/20/22 1547 10/21/22 0404 10/22/22 0143  HGB  --  9.1*  --   --  9.2* 10.2*  HCT  --  30.4*  --   --  31.1* 33.8*  PLT  --  168  --   --  168 187  APTT 29  --  78* 79*  --  69*  LABPROT 14.6  --   --   --   --   --   INR 1.2  --   --   --   --   --   HEPARINUNFRC >1.10*  --   --   --   --  0.45  CREATININE  --  0.96  --   --  0.94 1.16*    Estimated Creatinine Clearance: 39 mL/min (A) (by C-G formula based on SCr of 1.16 mg/dL (H)).  Medical History: Past Medical History:  Diagnosis Date   Arthritis    Atrial fibrillation, persistent (HCC)    a. s/p TEE-guided DCCV on 08/20/2017; b. 06/2021 Recurrent Afib-->amio/TEE/DCCV.   CAD (coronary artery disease)    a. 01/2012 Cath: nonobs dzs; b. 04/2014 MV: No ischemia; c. 09/2017 Cath: Minimal nonobs RCA dzs, otw nl cors.   Cardiorenal syndrome    a. 09/2017 Creat rose to 2.7 w/ diuresis-->HD x 2.   Chronic back pain    Chronic combined systolic (congestive) and diastolic (congestive) heart failure (HCC)    a. Dx 2005 @ [facility];  b. 02/2014 Echo: EF 55-60%; c. 07/2017: EF 30-35%; d. 09/2017 Echo: EF 40-45%; e. 02/2018 Echo: EF 55-60%; f. 08/2019 Echo: EF 60-65%; g. 08/2020 RHC: RA 3, RV 30/1, PA 33/11 (20), PCWP 6; f. 08/2021 Echo: EF 60-65%, no rwma, GrIDD, nl RV fxn, RVSP 40.43mmHg, mod dil LA, mod TR.   Chronic respiratory failure (HCC)    a. on home O2   Gastritis    History of intracranial hemorrhage    a. 6/14 described by neurosurgery as "small frontal posttraumatic SAH " - pt was on xarelto @ the time.   Hyperlipidemia    Hypertension    Interstitial lung disease (HCC)    Lipoma of back    Lymphedema    a. Chronic  LLE edema.   NICM (nonischemic cardiomyopathy) (HCC)    a. 09/2017 Echo: EF 40-45%; b. 09/2017 Cath: minor, insignificant CAD; c. 02/2018 Echo: EF 55-60%, Gr1 DD; d. 08/2019 Echo: EF 60-65%, Gr2 DD.   Obesity    PAH (pulmonary artery hypertension) (HCC)    a. 09/2017 Echo: PASP ; b. 09/2017 RHC: RA 23, RV 84/13, PA 84/29, PCWP 20, CO 3.89, CI 1.89. LVEDP 18; c. 08/2020 RHC: RA 3, RV 30/1, PA 33/11 (20), PCWP 6. CO/CI 6.18/3.36. PVR 2.3 WU.   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 06/2021. Veg on PPM lead. Not candidate for extraction-->s/p 6 wks abx.   Sepsis with metabolic encephalopathy (HCC)    Sleep apnea    Streptococcal bacteremia    Stroke [facility]) 2014   Subarachnoid hemorrhage (HCC) 01/19/2013   Symptomatic bradycardia    a. s/p BSX PPM.   Thyroid nodule  Type II diabetes mellitus (HCC)    Urinary incontinence     Assessment: Pharmacy consulted to dose heparin in this 77 year old female admitted with abdominal pain, on Eliquis 5 mg PO BID PTA for chronic bilateral DVT's, Afib.  Last dose of Eliquis 4/28.  Baseline HL > 1.10   EGD 4/30 Colonoscopy 5/1  0429 0814 aPTT 78, therapeutic x 1; 1000 un/hr 0429 1547 aPTT 79, therapeutic x 2; 1000 un/hr 0501 0143 aPTT 69, therapeutic x 3, HL 0.45  Goal of Therapy:  Heparin level 0.3-0.7 units/ml aPTT 66 - 102 seconds Monitor platelets by anticoagulation protocol: Yes   Plan:  --Continue to follow aPTT until correlation established with HL and then switch over to HL monitoring only --Daily CBC per protocol while on IV heparin --Heparin to be stopped 5/1 at 0630 for planned colonoscopy, pharmacy to f/u regarding anticoag plan post procedure.  Otelia Sergeant, PharmD, [facility] 10/22/2022 2:27 AM

## 2022-10-22 NOTE — Care Plan (Signed)
Brief GI Post Op note  Colonoscopy completed.  Only polyps and diverticulosis was noted Resume heparin drip in 6 hours.  Restart OAC/Eliquis in 2 days  Perform video capsule endoscopy. Remain n.p.o. 4 hours after capsule administration then can resume full liquid diet If capsule is negative recommend Guynn follow-up for potential abdominal pain source as endoscopy has been unrevealing  Upper endoscopy was not supportive of previous CT findings as there is no ulcer or signs of malignancy.  It is possible that the small bowel is telescoping through the pylorus which caused the CT findings, however, biopsies were negative for small bowel in the stomach and surgical intervention would be the fix for this.  However, given her chronic hypoxic respiratory failure, she would likely be a poor surgical candidate  Dispo does not need to be held up for capsule reading once the capsule study is complete and device removed.  Symptomatic control including antiemetics and pain control as per primary team  Enis Slipper, DO [facility]

## 2022-10-22 NOTE — Interval H&P Note (Signed)
History and Physical Interval Note: Consult note from 10/20/22  was reviewed and there was no interval change after seeing and examining the patient.  No change in exam. Labs reviewed and are stable. Written consent was obtained from the patient after discussion of risks, benefits, and alternatives. Patient has consented to proceed with Colonoscopy and Video capsule endoscopy  with possible intervention   Colonoscopy and video capsule endoscopy with possible biopsy, control of bleeding, polypectomy, and interventions as necessary has been discussed with the patient/patient representative. Informed consent was obtained from the patient/patient representative after explaining the indication, nature, and risks of the procedure including but not limited to death, bleeding, perforation, missed neoplasm/lesions, cardiorespiratory compromise, and reaction to medications. Opportunity for questions was given and appropriate answers were provided. Patient/patient representative has verbalized understanding is amenable to undergoing the procedure.   10/22/2022 3:27 PM  Debra Saunders  has presented today for surgery, with the diagnosis of abnormal ct, abdominal pain.  The various methods of treatment have been discussed with the patient and family. After consideration of risks, benefits and other options for treatment, the patient has consented to  Procedure(s) with comments: COLONOSCOPY WITH PROPOFOL (N/A) GIVENS CAPSULE STUDY (N/A) - IF NOTHING IS FOUND WITH COLONOSCOPY as a surgical intervention.  The patient's history has been reviewed, patient examined, no change in status, stable for surgery.  I have reviewed the patient's chart and labs.  Questions were answered to the patient's satisfaction.     Debra Saunders

## 2022-10-22 NOTE — Care Management Important Message (Signed)
Important Message  Patient Details  Name: Debra Saunders MRN: [id number] Date of Birth: Oct 15, 1945   Medicare Important Message Given:  Yes     Johnell Comings 10/22/2022, 11:10 AM

## 2022-10-22 NOTE — Progress Notes (Signed)
ANTICOAGULATION CONSULT NOTE   Pharmacy Consult for Heparin Infusion Indication: atrial fibrillation and history of bilateral DVT   Patient Measurements: Height: 5' (152.4 cm) Weight: 88.4 kg (194 lb 14.2 oz) IBW/kg (Calculated) : 45.5 Heparin Dosing Weight: 66.5 kg   Labs: Recent Labs    10/20/22 0052 10/20/22 0423 10/20/22 0814 10/20/22 1547 10/21/22 0404 10/22/22 0143  HGB  --  9.1*  --   --  9.2* 10.2*  HCT  --  30.4*  --   --  31.1* 33.8*  PLT  --  168  --   --  168 187  APTT 29  --  78* 79*  --  69*  LABPROT 14.6  --   --   --   --   --   INR 1.2  --   --   --   --   --   HEPARINUNFRC >1.10*  --   --   --   --  0.45  CREATININE  --  0.96  --   --  0.94 1.16*    Estimated Creatinine Clearance: 38.9 mL/min (A) (by C-G formula based on SCr of 1.16 mg/dL (H)).  Medical History: Past Medical History:  Diagnosis Date   Arthritis    Atrial fibrillation, persistent (HCC)    a. s/p TEE-guided DCCV on 08/20/2017; b. 06/2021 Recurrent Afib-->amio/TEE/DCCV.   CAD (coronary artery disease)    a. 01/2012 Cath: nonobs dzs; b. 04/2014 MV: No ischemia; c. 09/2017 Cath: Minimal nonobs RCA dzs, otw nl cors.   Cardiorenal syndrome    a. 09/2017 Creat rose to 2.7 w/ diuresis-->HD x 2.   Chronic back pain    Chronic combined systolic (congestive) and diastolic (congestive) heart failure (HCC)    a. Dx 2005 @ [facility];  b. 02/2014 Echo: EF 55-60%; c. 07/2017: EF 30-35%; d. 09/2017 Echo: EF 40-45%; e. 02/2018 Echo: EF 55-60%; f. 08/2019 Echo: EF 60-65%; g. 08/2020 RHC: RA 3, RV 30/1, PA 33/11 (20), PCWP 6; f. 08/2021 Echo: EF 60-65%, no rwma, GrIDD, nl RV fxn, RVSP 40.65mmHg, mod dil LA, mod TR.   Chronic respiratory failure (HCC)    a. on home O2   Gastritis    History of intracranial hemorrhage    a. 6/14 described by neurosurgery as "small frontal posttraumatic SAH " - pt was on xarelto @ the time.   Hyperlipidemia    Hypertension    Interstitial lung disease (HCC)    Lipoma of back     Lymphedema    a. Chronic LLE edema.   NICM (nonischemic cardiomyopathy) (HCC)    a. 09/2017 Echo: EF 40-45%; b. 09/2017 Cath: minor, insignificant CAD; c. 02/2018 Echo: EF 55-60%, Gr1 DD; d. 08/2019 Echo: EF 60-65%, Gr2 DD.   Obesity    PAH (pulmonary artery hypertension) (HCC)    a. 09/2017 Echo: PASP ; b. 09/2017 RHC: RA 23, RV 84/13, PA 84/29, PCWP 20, CO 3.89, CI 1.89. LVEDP 18; c. 08/2020 RHC: RA 3, RV 30/1, PA 33/11 (20), PCWP 6. CO/CI 6.18/3.36. PVR 2.3 WU.   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 06/2021. Veg on PPM lead. Not candidate for extraction-->s/p 6 wks abx.   Sepsis with metabolic encephalopathy (HCC)    Sleep apnea    Streptococcal bacteremia    Stroke [facility]) 2014   Subarachnoid hemorrhage (HCC) 01/19/2013   Symptomatic bradycardia    a. s/p BSX PPM.   Thyroid nodule  Type II diabetes mellitus (HCC)    Urinary incontinence     Assessment: Debra Saunders is a 77 y.o. female presenting with abdominal pain. PMH significant for T2DM, obesity, OSA on CPAP, HTN, anemia, CKD3b, PAF, DVT (2014), CHF, GERD. Patient was on Eliquis PTA per chart review. Last dose of Eliquis 4/28. Patient underwent EGD 4/30 and colonoscopy 5/1. Pharmacy has been consulted to initiate and manage heparin infusion.   Baseline Labs: aPTT 30, PT 14.6, INR 1.2, Hgb 9.1, Hct 30.4, Plt 168   Goal of Therapy:  Heparin level 0.3-0.7 units/ml aPTT 66 - 102 seconds Monitor platelets by anticoagulation protocol: Yes   Date Time aPTT/HL Rate/Comment  4/29 0814 78/---  1000/therapeutic x1 4/29 1547 79/---  1000/therapeutic x2 5/1 0143 69/0.45 1000/therapeutic x3  Plan:  Resume heparin infusion at 1000 units/hr at 2200 without bolus per MD Check HL 8 hours after resuming  Continue to monitor H&H and platelets daily while on heparin infusion   Celene Squibb, PharmD PGY1 Pharmacy Resident 10/22/2022 6:30 PM

## 2022-10-22 NOTE — Hospital Course (Addendum)
PMH of type II DM, obesity, OSA, on CPAP, permanent A-fib, history of DVT in 2014 on anticoagulation, CKD 3B, pulmonary hypertension with chronic respiratory failure on 4 LPM. Presented to the hospital with complaints of abdominal pain for 2 to 3 weeks.  Had outpatient CT abdomen which was concerning for possible peptic ulcer disease.  GI was consulted.  Underwent EGD colonoscopy as well as capsule endoscopy.  All were unremarkable for any explanation of her abdominal pain. Continues to have ongoing abdominal pain and also had some chest pain on 5/3.  Currently plan is for pain control and ensure adequate oral intake.

## 2022-10-22 NOTE — Op Note (Signed)
[facility] Patient Name: Debra Saunders Procedure Date: 10/22/2022 3:29 PM MRN: [id number] Account #: [id number] Date of Birth: 1945/07/25 Admit Type: Inpatient Age: 77 Room: [facility] ENDO ROOM 3 Gender: Female Note Status: Finalized Instrument Name: Colonscope [id number] Procedure:             Colonoscopy Indications:           Generalized abdominal pain Providers:             Trenda Moots, DO Referring MD:          Yehuda Mao. Birdie Sons (Referring MD) Medicines:             Monitored Anesthesia Care Complications:         No immediate complications. Estimated blood loss:                         Minimal. Procedure:             Pre-Anesthesia Assessment:                        - Prior to the procedure, a History and Physical was                         performed, and patient medications and allergies were                         reviewed. The patient is competent. The risks and                         benefits of the procedure and the sedation options and                         risks were discussed with the patient. All questions                         were answered and informed consent was obtained.                         Patient identification and proposed procedure were                         verified by the physician, the nurse, the anesthetist                         and the technician in the endoscopy suite. Mental                         Status Examination: alert and oriented. Airway                         Examination: normal oropharyngeal airway and neck                         mobility. Respiratory Examination: poor air movement.                         CV Examination: RRR, no murmurs, no S3 or S4.  Prophylactic Antibiotics: The patient does not require                         prophylactic antibiotics. Prior Anticoagulants: The                         patient has taken heparin, last dose was day of                          procedure. ASA Grade Assessment: III - A patient with                         severe systemic disease. After reviewing the risks and                         benefits, the patient was deemed in satisfactory                         condition to undergo the procedure. The anesthesia                         plan was to use monitored anesthesia care (MAC).                         Immediately prior to administration of medications,                         the patient was re-assessed for adequacy to receive                         sedatives. The heart rate, respiratory rate, oxygen                         saturations, blood pressure, adequacy of pulmonary                         ventilation, and response to care were monitored                         throughout the procedure. The physical status of the                         patient was re-assessed after the procedure.                        After obtaining informed consent, the colonoscope was                         passed under direct vision. Throughout the procedure,                         the patient's blood pressure, pulse, and oxygen                         saturations were monitored continuously. The                         Colonoscope was introduced through the anus and  advanced to the the cecum, identified by appendiceal                         orifice and ileocecal valve. The colonoscopy was                         performed without difficulty. The patient tolerated                         the procedure well. The quality of the bowel                         preparation was evaluated using the BBPS [facility] Bowel                         Preparation Scale) with scores of: Right Colon = 2                         (minor amount of residual staining, small fragments of                         stool and/or opaque liquid, but mucosa seen well),                         Transverse Colon = 2 (minor amount of  residual                         staining, small fragments of stool and/or opaque                         liquid, but mucosa seen well) and Left Colon = 2                         (minor amount of residual staining, small fragments of                         stool and/or opaque liquid, but mucosa seen well). The                         total BBPS score equals 6. Fair Prep. The ileocecal                         valve, appendiceal orifice, and rectum were                         photographed. Findings:      Hemorrhoids were found on perianal exam.      The digital rectal exam was normal. Pertinent negatives include normal       sphincter tone.      Two sessile polyps were found in the transverse colon. The polyps were 1       to 2 mm in size. These polyps were removed with a jumbo cold forceps.       Resection and retrieval were complete. Estimated blood loss was minimal.      A 3 to 4 mm polyp was found in the ascending colon. The polyp was       sessile. The polyp was  removed with a cold snare. Resection and       retrieval were complete. Estimated blood loss was minimal.      A 7 to 9 mm polyp was found in the cecum. The polyp was sessile. The       polyp was removed with a cold snare. Resection and retrieval were       complete. To prevent bleeding after the polypectomy, one hemostatic clip       was successfully placed (MR conditional). There was no bleeding at the       end of the procedure. Estimated blood loss: none.      Multiple small-mouthed diverticula were found in the left colon.       Estimated blood loss: none.      Non-bleeding external and internal hemorrhoids were found during       retroflexion and during perianal exam. Estimated blood loss: none.      The exam was otherwise without abnormality on direct and retroflexion       views. Impression:            - Hemorrhoids found on perianal exam.                        - Two 1 to 2 mm polyps in the transverse colon,                          removed with a jumbo cold forceps. Resected and                         retrieved.                        - One 3 to 4 mm polyp in the ascending colon, removed                         with a cold snare. Resected and retrieved.                        - One 7 to 9 mm polyp in the cecum, removed with a                         cold snare. Resected and retrieved. Clip (MR                         conditional) was placed.                        - Diverticulosis in the left colon.                        - Non-bleeding external and internal hemorrhoids.                        - The examination was otherwise normal on direct and                         retroflexion views. Recommendation:        - Return patient to hospital ward for ongoing care.                        -  NPO.                        - Continue present medications.                        - Restart heparin in 6 hours.                        Hold oral anticoagulant for 2 days post procedure.                        Will proceed with video capsule endoscopy                        NPO for 4 hours post starting capsule. Then can resume                         full liquid diet.                        - Await pathology results.                        - The findings and recommendations were discussed with                         the patient.                        - The findings and recommendations were discussed with                         the patient's family.                        - The findings and recommendations were discussed with                         the referring physician. Procedure Code(s):     --- Professional ---                        [phone], Colonoscopy, flexible; with removal of                         tumor(s), polyp(s), or other lesion(s) by snare                         technique                        45380, 59, Colonoscopy, flexible; with biopsy, single                         or  multiple Diagnosis Code(s):     --- Professional ---                        D12.3, Benign neoplasm of transverse colon (hepatic                         flexure or splenic flexure)  D12.2, Benign neoplasm of ascending colon                        D12.0, Benign neoplasm of cecum                        K64.8, Other hemorrhoids                        R10.84, Generalized abdominal pain                        K57.30, Diverticulosis of large intestine without                         perforation or abscess without bleeding CPT copyright 2022 American Medical Association. All rights reserved. The codes documented in this report are preliminary and upon coder review may  be revised to meet current compliance requirements. Attending Participation:      I personally performed the entire procedure. Elfredia Nevins, DO Jaynie Collins DO, DO 10/22/2022 4:15:10 PM This report has been signed electronically. Number of Addenda: 0 Note Initiated On: 10/22/2022 3:29 PM Scope Withdrawal Time: 0 hours 13 minutes 59 seconds  Total Procedure Duration: 0 hours 22 minutes 57 seconds  Estimated Blood Loss:  Estimated blood loss was minimal.      [facility]

## 2022-10-22 NOTE — Progress Notes (Signed)
[facility] Progress Note Patient: Debra Saunders ZOX:[id number] DOB: 01-14-1946 DOA: 10/19/2022  DOS: the patient was seen and examined on 10/22/2022  Brief hospital course: PMH of type II DM, obesity, OSA, on CPAP, permanent A-fib, history of DVT in 2014 on anticoagulation, CKD 3B, pulmonary hypertension with chronic respiratory failure on 4 LPM. Presented to the hospital with complaints of abdominal pain for 2 to 3 weeks.  Had outpatient CT abdomen which was concerning for possible peptic ulcer disease.  GI was consulted.  Underwent EGD colonoscopy as well as capsule endoscopy. Assessment and Plan: Concern for peptic ulcer disease. Ongoing abdominal pain Has chronic abdominal pain ongoing for last 2 to 3 weeks. Underwent CT scan outside of the hospital which was concerning for ulceration in the pyloric area. GI was consulted.  Underwent EGD which shows no ulceration. Underwent colonoscopy due to anemia as well as ongoing symptoms which is also negative for any acute abnormality. Capsule endoscopy currently undergoing. Appreciate GI assistance. Had some polyps which were removed and therefore heparin will need to be resumed 6 hours later. Patient actually continues to report that she has abdominal cramps leading to poor p.o. intake. Will attempt Bentyl and monitor reaction.  Permanent atrial fibrillation on Eliquis Hold off on Eliquis as recommended by GI Currently on heparin drip.   History of bilateral chronic DVTs Hold off home Eliquis as recommended by GI   Chronic diastolic CHF Pulmonary artery hypertension Chronic hypoxic respiratory failure Euvolemic on exam Last 2D echo done on 08/23/2021 revealed LVEF 60-65% and grade 1 diastolic dysfunction. Resume PAH medications. Continue diuretics. Continue follow-up with cardiology outpatient. On 4 LPM at home.    Obesity Body mass index is 38.06 kg/m.  Placing the patient at high risk of poor outcome.   OSA CPAP nightly    Chronic hypotension Resume home midodrine Maintain MAP greater than 65 Closely monitor vital signs.   Chronic anxiety/depression Stable Resume home regimen.  Subjective: Seen after colonoscopy.  Continues to report abdominal pain and minimal oral intake.  No nausea no vomiting.  Physical Exam: General: in Mild distress, No Rash Cardiovascular: S1 and S2 Present, No Murmur Respiratory: Good respiratory effort, Bilateral Air entry present. No Crackles, No wheezes Abdomen: Bowel Sound present, mild diffuse tenderness Extremities: Bilateral edema Neuro: Alert and oriented x3, no new focal deficit  Data Reviewed: I have Reviewed nursing notes, Vitals, and Lab results. Since last encounter, pertinent lab results CBC and BMP   . I have ordered test including CBC and BMP  . I have discussed pt's care plan and test results with GI  .   Disposition: Status is: Inpatient Remains inpatient appropriate because: Monitor for improvement in oral intake as well as abdominal pain symptoms.  Family Communication: No one at bedside Level of care: Progressive   Vitals:   10/22/22 1449 10/22/22 1605 10/22/22 1635 10/22/22 1720  BP: (!) 130/47   (!) 120/58  Pulse: 61  61   Resp: 18  (!) 22   Temp: (!) 97.1 F (36.2 C) (!) 97 F (36.1 C)  98.2 F (36.8 C)  TempSrc: Temporal Temporal  Oral  SpO2: 100%  100% 99%  Weight: 88.4 kg     Height: 5' (1.524 m)        Author: Lynden Oxford, MD 10/22/2022 6:55 PM  Please look on [email or web address] to find out who is on call.

## 2022-10-22 NOTE — Anesthesia Postprocedure Evaluation (Signed)
Anesthesia Post Note  Patient: CASSIDEY BARRALES  Procedure(s) Performed: COLONOSCOPY WITH PROPOFOL GIVENS CAPSULE STUDY  Patient location during evaluation: Endoscopy Anesthesia Type: General Level of consciousness: awake and alert Pain management: pain level controlled Vital Signs Assessment: post-procedure vital signs reviewed and stable Respiratory status: spontaneous breathing, nonlabored ventilation, respiratory function stable and patient connected to nasal cannula oxygen Cardiovascular status: blood pressure returned to baseline and stable Postop Assessment: no apparent nausea or vomiting Anesthetic complications: no   No notable events documented.   Last Vitals:  Vitals:   10/22/22 1605 10/22/22 1635  BP:    Pulse:  61  Resp:  (!) 22  Temp: (!) 36.1 C   SpO2:  100%    Last Pain:  Vitals:   10/22/22 1635  TempSrc:   PainSc: 5                  Louie Boston

## 2022-10-22 NOTE — Plan of Care (Signed)
  Problem: Health Behavior/Discharge Planning: Goal: Ability to manage health-related needs will improve Outcome: Progressing   Problem: Clinical Measurements: Goal: Will remain free from infection Outcome: Progressing   Problem: Clinical Measurements: Goal: Respiratory complications will improve Outcome: Progressing   Problem: Clinical Measurements: Goal: Cardiovascular complication will be avoided Outcome: Progressing   Problem: Nutrition: Goal: Adequate nutrition will be maintained Outcome: Progressing   Problem: Elimination: Goal: Will not experience complications related to bowel motility Outcome: Progressing   Problem: Elimination: Goal: Will not experience complications related to urinary retention Outcome: Progressing   Problem: Pain Managment: Goal: General experience of comfort will improve Outcome: Progressing

## 2022-10-22 NOTE — Anesthesia Preprocedure Evaluation (Signed)
Anesthesia Evaluation  Patient identified by MRN, date of birth, ID band Patient awake  General Assessment Comment:Patient had vomiting two weeks ago, none since then. Has had persistent abdominal pain since then.  Reviewed: Allergy & Precautions, NPO status , Patient's Chart, lab work & pertinent test results  History of Anesthesia Complications Negative for: history of anesthetic complications  Airway Mallampati: II  TM Distance: >3 FB Neck ROM: Full    Dental no notable dental hx. (+) Teeth Intact   Pulmonary sleep apnea, Continuous Positive Airway Pressure Ventilation and Oxygen sleep apnea , neg COPD, Patient abstained from smoking.Not current smoker, former smoker 4L/min O2 at baseline    + decreased breath sounds      Cardiovascular Exercise Tolerance: Poor METShypertension, pulmonary hypertension+ CAD and +CHF  (-) Past MI (-) dysrhythmias + pacemaker  Rhythm:Regular Rate:Normal - Systolic murmurs TTE 1. Left ventricular ejection fraction, by estimation, is 60 to 65%. The  left ventricle has normal function. The left ventricle has no regional  wall motion abnormalities. There is mild left ventricular hypertrophy.  Left ventricular diastolic parameters  are consistent with Grade I diastolic dysfunction (impaired relaxation).   2. Right ventricular systolic function is normal. The right ventricular  size is normal. There is mildly elevated pulmonary artery systolic  pressure. The estimated right ventricular systolic pressure is 40.3 mmHg.   3. Left atrial size was moderately dilated.   4. The mitral valve is normal in structure. No evidence of mitral valve  regurgitation. No evidence of mitral stenosis.   5. Tricuspid valve regurgitation is moderate.   6. The aortic valve is normal in structure. Aortic valve regurgitation is  not visualized. Aortic valve sclerosis/calcification is present, without  any evidence of aortic  stenosis.   7. The inferior vena cava is normal in size with greater than 50%  respiratory variability, suggesting right atrial pressure of 3 mmHg.   8. RV pacer wire difficult to visualize, grossly no vegetation noted.     Neuro/Psych CVA, No Residual Symptoms  negative psych ROS   GI/Hepatic ,neg GERD  ,,(+)     (-) substance abuse    Endo/Other  diabetes    Renal/GU CRFRenal disease     Musculoskeletal   Abdominal  (+) + obese  Peds  Hematology   Anesthesia Other Findings Past Medical History: No date: Arthritis No date: Atrial fibrillation, persistent (HCC)     Comment:  a. s/p TEE-guided DCCV on 08/20/2017; b. 06/2021 Recurrent              Afib-->amio/TEE/DCCV. No date: CAD (coronary artery disease)     Comment:  a. 01/2012 Cath: nonobs dzs; b. 04/2014 MV: No ischemia;               c. 09/2017 Cath: Minimal nonobs RCA dzs, otw nl cors. No date: Cardiorenal syndrome     Comment:  a. 09/2017 Creat rose to 2.7 w/ diuresis-->HD x 2. No date: Chronic back pain No date: Chronic combined systolic (congestive) and diastolic  (congestive) heart failure (HCC)     Comment:  a. Dx 2005 @ [facility];  b. 02/2014 Echo: EF 55-60%; c.               07/2017: EF 30-35%; d. 09/2017 Echo: EF 40-45%; e. 02/2018               Echo: EF 55-60%; f. 08/2019 Echo: EF 60-65%; g. 08/2020  RHC: RA 3, RV 30/1, PA 33/11 (20), PCWP 6; f. 08/2021               Echo: EF 60-65%, no rwma, GrIDD, nl RV fxn, RVSP               40.37mmHg, mod dil LA, mod TR. No date: Chronic respiratory failure (HCC)     Comment:  a. on home O2 No date: Gastritis No date: History of intracranial hemorrhage     Comment:  a. 6/14 described by neurosurgery as "small frontal               posttraumatic SAH " - pt was on xarelto @ the time. No date: Hyperlipidemia No date: Hypertension No date: Interstitial lung disease (HCC) No date: Lipoma of back No date: Lymphedema     Comment:  a. Chronic LLE edema. No date: NICM  (nonischemic cardiomyopathy) (HCC)     Comment:  a. 09/2017 Echo: EF 40-45%; b. 09/2017 Cath: minor,               insignificant CAD; c. 02/2018 Echo: EF 55-60%, Gr1 DD; d.               08/2019 Echo: EF 60-65%, Gr2 DD. No date: Obesity No date: PAH (pulmonary artery hypertension) (HCC)     Comment:  a. 09/2017 Echo: PASP ; b. 09/2017 RHC: RA 23, RV               84/13, PA 84/29, PCWP 20, CO 3.89, CI 1.89. LVEDP 18; c.               08/2020 RHC: RA 3, RV 30/1, PA 33/11 (20), PCWP 6. CO/CI               6.18/3.36. PVR 2.3 WU. 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 06/2021. Veg on PPM lead. Not candidate for               extraction-->s/p 6 wks abx. No date: Sepsis with metabolic encephalopathy (HCC) No date: Sleep apnea No date: Streptococcal bacteremia 2014: Stroke (HCC) 01/19/2013: Subarachnoid hemorrhage (HCC) No date: Symptomatic bradycardia     Comment:  a. s/p BSX PPM. No date: Thyroid nodule No date: Type II diabetes mellitus (HCC) No date: Urinary incontinence  Reproductive/Obstetrics                              Anesthesia Physical Anesthesia Plan  ASA: 3  Anesthesia Plan: General   Post-op Pain Management: Minimal or no pain anticipated   Induction: Intravenous  PONV Risk Score and Plan: 3 and Propofol infusion, TIVA and Ondansetron  Airway Management Planned: Nasal Cannula  Additional Equipment: None  Intra-op Plan:   Post-operative Plan:   Informed Consent: I have reviewed the patients History and Physical, chart, labs and discussed the procedure including the risks, benefits and alternatives for the proposed anesthesia with the patient or authorized representative who has indicated his/her understanding and acceptance.     Dental advisory given  Plan Discussed with: CRNA and Surgeon  Anesthesia Plan Comments: (Discussed risks of anesthesia with  patient, including possibility of difficulty with spontaneous ventilation under anesthesia necessitating airway intervention, PONV, and rare risks such as cardiac or respiratory or neurological events, and allergic reactions. Discussed the role of CRNA in patient's perioperative care. Patient understands.)  Anesthesia Quick Evaluation  

## 2022-10-22 NOTE — Plan of Care (Signed)

## 2022-10-22 NOTE — Progress Notes (Signed)
Confirmed with MD Locklear to stopped heparin drip at this time. Heparin drip stopped. Will notify incoming shift. Will continue to monitor.

## 2022-10-23 ENCOUNTER — Inpatient Hospital Stay: Payer: Medicare HMO

## 2022-10-23 DIAGNOSIS — R109 Unspecified abdominal pain: Secondary | ICD-10-CM | POA: Diagnosis not present

## 2022-10-23 LAB — CBC
HCT: 31 % — ABNORMAL LOW (ref 36.0–46.0)
Hemoglobin: 9.4 g/dL — ABNORMAL LOW (ref 12.0–15.0)
MCH: 27.5 pg (ref 26.0–34.0)
MCHC: 30.3 g/dL (ref 30.0–36.0)
MCV: 90.6 fL (ref 80.0–100.0)
Platelets: 192 10*3/uL (ref 150–400)
RBC: 3.42 MIL/uL — ABNORMAL LOW (ref 3.87–5.11)
RDW: 12.4 % (ref 11.5–15.5)
WBC: 5.6 10*3/uL (ref 4.0–10.5)
nRBC: 0 % (ref 0.0–0.2)

## 2022-10-23 LAB — BASIC METABOLIC PANEL
Anion gap: 11 (ref 5–15)
BUN: 21 mg/dL (ref 8–23)
CO2: 27 mmol/L (ref 22–32)
Calcium: 9 mg/dL (ref 8.9–10.3)
Chloride: 97 mmol/L — ABNORMAL LOW (ref 98–111)
Creatinine, Ser: 1.65 mg/dL — ABNORMAL HIGH (ref 0.44–1.00)
GFR, Estimated: 31 mL/min — ABNORMAL LOW (ref >60)
Glucose, Bld: 103 mg/dL — ABNORMAL HIGH (ref 70–99)
Potassium: 3.9 mmol/L (ref 3.5–5.1)
Sodium: 135 mmol/L (ref 135–145)

## 2022-10-23 LAB — HEPARIN LEVEL (UNFRACTIONATED)
Heparin Unfractionated: 0.63 IU/mL (ref 0.30–0.70)
Heparin Unfractionated: 0.82 IU/mL — ABNORMAL HIGH (ref 0.30–0.70)

## 2022-10-23 LAB — MAGNESIUM: Magnesium: 1.8 mg/dL (ref 1.7–2.4)

## 2022-10-23 LAB — LIPASE, BLOOD: Lipase: 37 U/L (ref 11–51)

## 2022-10-23 MED ORDER — LIDOCAINE VISCOUS HCL 2 % MT SOLN: 15.0000 mL | Freq: Four times a day (QID) | OROMUCOSAL | Status: DC | PRN

## 2022-10-23 MED ORDER — DIFLUPREDNATE 0.05 % OP EMUL: 1.0000 [drp] | Freq: Three times a day (TID) | OPHTHALMIC | Status: DC

## 2022-10-23 MED ORDER — HEPARIN (PORCINE) 25000 UT/250ML-% IV SOLN
750.0000 [IU]/h | INTRAVENOUS | Status: DC
  Administered 2022-10-23: 800 [IU]/h via INTRAVENOUS
  Filled 2022-10-23: qty 250

## 2022-10-23 NOTE — Progress Notes (Signed)
   10/23/22 0845  Assess: MEWS Score  Temp 97.8 F (36.6 C)  BP (!) 97/47  MAP (mmHg) (!) 59  Pulse Rate (!) 45  Resp 16  Level of Consciousness Alert  SpO2 99 %  O2 Device CPAP  Assess: MEWS Score  MEWS Temp 0  MEWS Systolic 1  MEWS Pulse 1  MEWS RR 0  MEWS LOC 0  MEWS Score 2  MEWS Score Color Yellow  Assess: if the MEWS score is Yellow or Red  Were vital signs taken at a resting state? Yes  Focused Assessment No change from prior assessment  Does the patient meet 2 or more of the SIRS criteria? No  MEWS guidelines implemented  Yes, yellow  Treat  MEWS Interventions Considered administering scheduled or prn medications/treatments as ordered  Take Vital Signs  Increase Vital Sign Frequency  Yellow: Q2hr x1, continue Q4hrs until patient remains green for 12hrs  Escalate  MEWS: Escalate Yellow: Discuss with charge nurse and consider notifying provider and/or RRT  Notify: Charge Nurse/RN  Name of Charge Nurse/RN Notified Lashana  Assess: SIRS CRITERIA  SIRS Temperature  0  SIRS Pulse 0  SIRS Respirations  0  SIRS WBC 0  SIRS Score Sum  0

## 2022-10-23 NOTE — Progress Notes (Signed)
[facility] Progress Note Patient: Debra Saunders VHQ:[id number] DOB: [birth year] DOA: 10/19/2022  DOS: the patient was seen and examined on 10/23/2022  Brief hospital course: PMH of type II DM, obesity, OSA, on CPAP, permanent A-fib, history of DVT in 2014 on anticoagulation, CKD 3B, pulmonary hypertension with chronic respiratory failure on 4 LPM. Presented to the hospital with complaints of abdominal pain for 2 to 3 weeks.  Had outpatient CT abdomen which was concerning for possible peptic ulcer disease.  GI was consulted.  Underwent EGD colonoscopy as well as capsule endoscopy. Assessment and Plan: Concern for peptic ulcer disease. Ongoing abdominal pain Has chronic abdominal pain ongoing for last 2 to 3 weeks. Underwent CT scan outside of the hospital which was concerning for ulceration in the pyloric area. GI was consulted.  Underwent EGD which shows no ulceration. Underwent colonoscopy due to anemia as well as ongoing symptoms which is also negative for any acute abnormality. Capsule endoscopy currently undergoing. Appreciate GI assistance. Had some polyps which were removed and therefore heparin will need to be resumed 6 hours later. Patient actually continues to report that she has abdominal cramps leading to poor p.o. intake. No significant improvement with Bentyl.  Continue pain control.  Permanent atrial fibrillation on Eliquis Bradycardia Hold off on Eliquis as recommended by GI Currently on heparin drip. Has pacemaker.  EKG unremarkable.  Chest x-ray unremarkable.   History of bilateral chronic DVTs Hold off home Eliquis as recommended by GI   Chronic diastolic CHF Pulmonary artery hypertension Chronic hypoxic respiratory failure Euvolemic on exam Last 2D echo done on 08/23/2021 revealed LVEF 60-65% and grade 1 diastolic dysfunction. Resume PAH medications. Continue diuretics. Continue follow-up with cardiology outpatient. On 4 LPM at home.    Obesity Body mass  index is 39.4 kg/m.  Placing the patient at high risk of poor outcome.   OSA CPAP nightly   Chronic hypotension Resume home midodrine Maintain MAP greater than 65 Closely monitor vital signs.   Chronic anxiety/depression Stable Resume home regimen.  AKI. Renal function worsening. Holding diuretics. Ultrasound renal unremarkable for any obstruction.  Subjective: Continues to have abdominal pain.  No nausea or vomiting but no fever no chills.  No BM so far.  Physical Exam: S1 and S2 present. No murmur. Bilateral basal crackles. Bowel sound present.  Diffusely tender. Improving edema.  Data Reviewed: I have Reviewed nursing notes, Vitals, and Lab results. Since last encounter, pertinent lab results CBC and BMP   . I have ordered test including CBC and BMP  .    Disposition: Status is: Inpatient Remains inpatient appropriate because: Monitor for improvement in oral intake as well as abdominal pain symptoms.  Family Communication: No one at bedside Level of care: Progressive   Vitals:   10/23/22 1045 10/23/22 1059 10/23/22 1128 10/23/22 1634  BP: (!) 130/55   (!) 108/57  Pulse: 71   (!) 40  Resp: 18 14 18 16   Temp: 98.1 F (36.7 C)   98.2 F (36.8 C)  TempSrc: Oral     SpO2: 100%   97%  Weight:      Height:         Author: Lynden Oxford, MD 10/23/2022 6:26 PM  Please look on [email or web address] to find out who is on call.

## 2022-10-23 NOTE — Progress Notes (Signed)
ANTICOAGULATION CONSULT NOTE   Pharmacy Consult for Heparin Infusion Indication: atrial fibrillation and history of bilateral DVT   Patient Measurements: Height: 5' (152.4 cm) Weight: 91.5 kg (201 lb 11.5 oz) IBW/kg (Calculated) : 45.5 Heparin Dosing Weight: 66.5 kg   Labs: Recent Labs    10/20/22 1547 10/21/22 0404 10/21/22 0404 10/22/22 0143 10/23/22 0609 10/23/22 1442  HGB  --  9.2*   < > 10.2* 9.4*  --   HCT  --  31.1*  --  33.8* 31.0*  --   PLT  --  168  --  187 192  --   APTT 79*  --   --  69*  --   --   HEPARINUNFRC  --   --   --  0.45 0.63 0.82*  CREATININE  --  0.94  --  1.16* 1.65*  --    < > = values in this interval not displayed.    Estimated Creatinine Clearance: 27.9 mL/min (A) (by C-G formula based on SCr of 1.65 mg/dL (H)).  Medical History: Past Medical History:  Diagnosis Date   Arthritis    Atrial fibrillation, persistent (HCC)    a. s/p TEE-guided DCCV on 08/20/2017; b. 06/2021 Recurrent Afib-->amio/TEE/DCCV.   CAD (coronary artery disease)    a. 01/2012 Cath: nonobs dzs; b. 04/2014 MV: No ischemia; c. 09/2017 Cath: Minimal nonobs RCA dzs, otw nl cors.   Cardiorenal syndrome    a. 09/2017 Creat rose to 2.7 w/ diuresis-->HD x 2.   Chronic back pain    Chronic combined systolic (congestive) and diastolic (congestive) heart failure (HCC)    a. Dx 2005 @ [facility];  b. 02/2014 Echo: EF 55-60%; c. 07/2017: EF 30-35%; d. 09/2017 Echo: EF 40-45%; e. 02/2018 Echo: EF 55-60%; f. 08/2019 Echo: EF 60-65%; g. 08/2020 RHC: RA 3, RV 30/1, PA 33/11 (20), PCWP 6; f. 08/2021 Echo: EF 60-65%, no rwma, GrIDD, nl RV fxn, RVSP 40.42mmHg, mod dil LA, mod TR.   Chronic respiratory failure (HCC)    a. on home O2   Gastritis    History of intracranial hemorrhage    a. 6/14 described by neurosurgery as "small frontal posttraumatic SAH " - pt was on xarelto @ the time.   Hyperlipidemia    Hypertension    Interstitial lung disease (HCC)    Lipoma of back    Lymphedema    a. Chronic LLE  edema.   NICM (nonischemic cardiomyopathy) (HCC)    a. 09/2017 Echo: EF 40-45%; b. 09/2017 Cath: minor, insignificant CAD; c. 02/2018 Echo: EF 55-60%, Gr1 DD; d. 08/2019 Echo: EF 60-65%, Gr2 DD.   Obesity    PAH (pulmonary artery hypertension) (HCC)    a. 09/2017 Echo: PASP ; b. 09/2017 RHC: RA 23, RV 84/13, PA 84/29, PCWP 20, CO 3.89, CI 1.89. LVEDP 18; c. 08/2020 RHC: RA 3, RV 30/1, PA 33/11 (20), PCWP 6. CO/CI 6.18/3.36. PVR 2.3 WU.   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 06/2021. Veg on PPM lead. Not candidate for extraction-->s/p 6 wks abx.   Sepsis with metabolic encephalopathy (HCC)    Sleep apnea    Streptococcal bacteremia    Stroke [facility]) 2014   Subarachnoid hemorrhage (HCC) 01/19/2013   Symptomatic bradycardia    a. s/p BSX PPM.   Thyroid nodule    Type II diabetes mellitus (HCC)    Urinary incontinence     Assessment: Debra Saunders is a 77  y.o. female presenting with abdominal pain. PMH significant for T2DM, obesity, OSA on CPAP, HTN, anemia, CKD3b, PAF, DVT (2014), CHF, GERD. Patient was on Eliquis PTA per chart review. Last dose of Eliquis 4/28. Patient underwent EGD 4/30 and colonoscopy 5/1. Pharmacy has been consulted to initiate and manage heparin infusion.   Baseline Labs: aPTT 30, PT 14.6, INR 1.2, Hgb 9.1, Hct 30.4, Plt 168   Goal of Therapy:  Heparin level 0.3-0.7 units/ml aPTT 66 - 102 seconds Monitor platelets by anticoagulation protocol: Yes   Date Time aPTT/HL Rate/Comment  4/29 0814 78/---  1000/therapeutic x1 4/29 1547 79/---  1000/therapeutic x2 5/1 0143 69/0.45 1000/therapeutic x3 5/2 0609 HL 0.63 Therapeutic x 1 after restart 5/2 1442 HL 0.82 Supratherapeutic.   Plan:  Heparin level is supratherapeutic. Will decrease heparin infusion to 800 units/hr. Recheck heparin level in 8 hours. CBC daily while on heparin.     Paschal Dopp, PharmD, 10/23/2022 3:18 PM

## 2022-10-23 NOTE — Progress Notes (Signed)
ANTICOAGULATION CONSULT NOTE   Pharmacy Consult for Heparin Infusion Indication: atrial fibrillation and history of bilateral DVT   Patient Measurements: Height: 5' (152.4 cm) Weight: 91.5 kg (201 lb 11.5 oz) IBW/kg (Calculated) : 45.5 Heparin Dosing Weight: 66.5 kg   Labs: Recent Labs    10/20/22 0814 10/20/22 1547 10/21/22 0404 10/21/22 0404 10/22/22 0143 10/23/22 0609  HGB  --   --  9.2*   < > 10.2* 9.4*  HCT  --   --  31.1*  --  33.8* 31.0*  PLT  --   --  168  --  187 192  APTT 78* 79*  --   --  69*  --   HEPARINUNFRC  --   --   --   --  0.45 0.63  CREATININE  --   --  0.94  --  1.16*  --    < > = values in this interval not displayed.    Estimated Creatinine Clearance: 39.7 mL/min (A) (by C-G formula based on SCr of 1.16 mg/dL (H)).  Medical History: Past Medical History:  Diagnosis Date   Arthritis    Atrial fibrillation, persistent (HCC)    a. s/p TEE-guided DCCV on 08/20/2017; b. 06/2021 Recurrent Afib-->amio/TEE/DCCV.   CAD (coronary artery disease)    a. 01/2012 Cath: nonobs dzs; b. 04/2014 MV: No ischemia; c. 09/2017 Cath: Minimal nonobs RCA dzs, otw nl cors.   Cardiorenal syndrome    a. 09/2017 Creat rose to 2.7 w/ diuresis-->HD x 2.   Chronic back pain    Chronic combined systolic (congestive) and diastolic (congestive) heart failure (HCC)    a. Dx 2005 @ [facility];  b. 02/2014 Echo: EF 55-60%; c. 07/2017: EF 30-35%; d. 09/2017 Echo: EF 40-45%; e. 02/2018 Echo: EF 55-60%; f. 08/2019 Echo: EF 60-65%; g. 08/2020 RHC: RA 3, RV 30/1, PA 33/11 (20), PCWP 6; f. 08/2021 Echo: EF 60-65%, no rwma, GrIDD, nl RV fxn, RVSP 40.12mmHg, mod dil LA, mod TR.   Chronic respiratory failure (HCC)    a. on home O2   Gastritis    History of intracranial hemorrhage    a. 6/14 described by neurosurgery as "small frontal posttraumatic SAH " - pt was on xarelto @ the time.   Hyperlipidemia    Hypertension    Interstitial lung disease (HCC)    Lipoma of back    Lymphedema    a. Chronic LLE  edema.   NICM (nonischemic cardiomyopathy) (HCC)    a. 09/2017 Echo: EF 40-45%; b. 09/2017 Cath: minor, insignificant CAD; c. 02/2018 Echo: EF 55-60%, Gr1 DD; d. 08/2019 Echo: EF 60-65%, Gr2 DD.   Obesity    PAH (pulmonary artery hypertension) (HCC)    a. 09/2017 Echo: PASP ; b. 09/2017 RHC: RA 23, RV 84/13, PA 84/29, PCWP 20, CO 3.89, CI 1.89. LVEDP 18; c. 08/2020 RHC: RA 3, RV 30/1, PA 33/11 (20), PCWP 6. CO/CI 6.18/3.36. PVR 2.3 WU.   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 06/2021. Veg on PPM lead. Not candidate for extraction-->s/p 6 wks abx.   Sepsis with metabolic encephalopathy (HCC)    Sleep apnea    Streptococcal bacteremia    Stroke [facility]) 2014   Subarachnoid hemorrhage (HCC) 01/19/2013   Symptomatic bradycardia    a. s/p BSX PPM.   Thyroid nodule    Type II diabetes mellitus (HCC)    Urinary incontinence     Assessment: Debra Saunders is  a 77 y.o. female presenting with abdominal pain. PMH significant for T2DM, obesity, OSA on CPAP, HTN, anemia, CKD3b, PAF, DVT (2014), CHF, GERD. Patient was on Eliquis PTA per chart review. Last dose of Eliquis 4/28. Patient underwent EGD 4/30 and colonoscopy 5/1. Pharmacy has been consulted to initiate and manage heparin infusion.   Baseline Labs: aPTT 30, PT 14.6, INR 1.2, Hgb 9.1, Hct 30.4, Plt 168   Goal of Therapy:  Heparin level 0.3-0.7 units/ml aPTT 66 - 102 seconds Monitor platelets by anticoagulation protocol: Yes   Date Time aPTT/HL Rate/Comment  4/29 0814 78/---  1000/therapeutic x1 4/29 1547 79/---  1000/therapeutic x2 5/1 0143 69/0.45 1000/therapeutic x3 5/2 0609 HL 0.63 Therapeutic x 1 after restart  Plan:  Continue heparin infusion at 1000 units/hr Recheck HL 8 hours to confirm  Continue to monitor H&H and platelets daily while on heparin infusion   Otelia Sergeant, PharmD, [facility] 10/23/2022 6:50 AM

## 2022-10-23 NOTE — Progress Notes (Signed)
Inpatient Follow-up/Progress Note   Patient ID: Debra Saunders is a 77 y.o. female.  Overnight Events / Subjective Findings NAEON. Pt completed capsule study. Tolerating PO. Abdominal pain improving with medication. Currently on the commode.  No other acute gi symptoms.  Review of Systems  Constitutional:  Negative for activity change, appetite change, chills, diaphoresis, fatigue, fever and unexpected weight change.  HENT:  Negative for trouble swallowing and voice change.   Respiratory:  Negative for shortness of breath and wheezing.   Cardiovascular:  Negative for chest pain, palpitations and leg swelling.  Gastrointestinal:  Positive for abdominal pain (improving). Negative for abdominal distention, anal bleeding, blood in stool, constipation, diarrhea, nausea, rectal pain and vomiting.  Musculoskeletal:  Negative for arthralgias and myalgias.  Skin:  Negative for color change and pallor.  Neurological:  Negative for dizziness, syncope and weakness.  Psychiatric/Behavioral:  Negative for confusion.   All other systems reviewed and are negative.    Medications  Current Facility-Administered Medications:    acetaminophen (TYLENOL) tablet 500 mg, 500 mg, Oral, Q6H PRN, Darlin Drop, DO, 500 mg at 10/21/22 9147   cefadroxil (DURICEF) capsule 500 mg, 500 mg, Oral, BID, Hall, Carole N, DO, 500 mg at 10/23/22 8295   DULoxetine (CYMBALTA) DR capsule 60 mg, 60 mg, Oral, Daily, Hall, Carole N, DO, 60 mg at 10/23/22 0841   gabapentin (NEURONTIN) capsule 300 mg, 300 mg, Oral, QHS, Lurene Shadow, MD, 300 mg at 10/22/22 2144   HYDROmorphone (DILAUDID) injection 0.5 mg, 0.5 mg, Intravenous, Q4H PRN, Margo Aye, Carole N, DO, 0.5 mg at 10/23/22 1058   lidocaine (XYLOCAINE) 2 % viscous mouth solution 15 mL, 15 mL, Oral, Q6H PRN, Jaynie Collins, DO   macitentan (OPSUMIT) tablet 10 mg, 10 mg, Oral, Daily, Hall, Carole N, DO, 10 mg at 10/23/22 0841   melatonin tablet 5 mg, 5 mg, Oral, QHS  PRN, Dow Adolph N, DO, 5 mg at 10/20/22 2127   midodrine (PROAMATINE) tablet 5 mg, 5 mg, Oral, BID WC, Hall, Carole N, DO, 5 mg at 10/23/22 6213   Oral care mouth rinse, 15 mL, Mouth Rinse, PRN, Dow Adolph N, DO   oxyCODONE (Oxy IR/ROXICODONE) immediate release tablet 5 mg, 5 mg, Oral, Q4H PRN, Hall, Carole N, DO   pantoprazole (PROTONIX) injection 40 mg, 40 mg, Intravenous, Q12H, Hall, Carole N, DO, 40 mg at 10/23/22 0841   polyethylene glycol (MIRALAX / GLYCOLAX) packet 17 g, 17 g, Oral, Daily PRN, Hall, Carole N, DO   prochlorperazine (COMPAZINE) injection 5 mg, 5 mg, Intravenous, Q6H PRN, Hall, Carole N, DO   rosuvastatin (CRESTOR) tablet 10 mg, 10 mg, Oral, Daily, Lurene Shadow, MD, 10 mg at 10/23/22 0841   Selexipag TABS 1,600 mcg, 1,600 mcg, Oral, BID, Hall, Carole N, DO, 1,600 mcg at 10/23/22 1056   sildenafil (REVATIO) tablet 20 mg, 20 mg, Oral, TID, Lurene Shadow, MD, 20 mg at 10/23/22 0841   acetaminophen, HYDROmorphone (DILAUDID) injection, lidocaine, melatonin, mouth rinse, oxyCODONE, polyethylene glycol, prochlorperazine   Objective    Vitals:   10/23/22 0845 10/23/22 1045 10/23/22 1059 10/23/22 1128  BP: (!) 97/47 (!) 130/55    Pulse: (!) 45 71    Resp: 16 18 14 18   Temp: 97.8 F (36.6 C) 98.1 F (36.7 C)    TempSrc: Oral Oral    SpO2: 99% 100%    Weight:      Height:         Physical Exam Vitals and nursing note  reviewed.  Constitutional:      General: She is not in acute distress.    Appearance: Normal appearance. She is obese. She is not ill-appearing, toxic-appearing or diaphoretic.  HENT:     Head: Normocephalic and atraumatic.     Nose: Nose normal.  Eyes:     Extraocular Movements: Extraocular movements intact.  Cardiovascular:     Rate and Rhythm: Normal rate.  Pulmonary:     Effort: Pulmonary effort is normal. No respiratory distress.     Breath sounds: Normal breath sounds. No wheezing.  Musculoskeletal:     Cervical back: Neck supple.   Skin:    General: Skin is dry.     Coloration: Skin is not jaundiced or pale.  Neurological:     General: No focal deficit present.     Mental Status: She is alert and oriented to person, place, and time. Mental status is at baseline.  Psychiatric:        Mood and Affect: Mood normal.        Behavior: Behavior normal.        Thought Content: Thought content normal.        Judgment: Judgment normal.      Laboratory Data Recent Labs  Lab 10/17/22 1139 10/19/22 1847 10/21/22 0404 10/22/22 0143 10/23/22 0609  WBC 6.4   < > 5.1 5.3 5.6  HGB 10.1*   < > 9.2* 10.2* 9.4*  HCT 32.1*   < > 31.1* 33.8* 31.0*  PLT 187.0   < > 168 187 192  NEUTOPHILPCT 64.9  --   --   --   --   LYMPHOPCT 23.9  --   --   --   --   MONOPCT 7.4  --   --   --   --   EOSPCT 3.0  --   --   --   --    < > = values in this interval not displayed.   Recent Labs  Lab 10/19/22 1847 10/20/22 0423 10/21/22 0404 10/22/22 0143 10/23/22 0609  NA 137 141 138 136 135  K 3.0* 2.9* 3.9 3.9 3.9  CL 100 101 101 100 97*  CO2 28 32 29 29 27   BUN 24* 23 19 18 21   CREATININE 1.05* 0.96 0.94 1.16* 1.65*  CALCIUM 8.6* 8.7* 9.0 9.0 9.0  PROT 6.7 6.6 6.7  --   --   BILITOT 0.6 0.8 0.6  --   --   ALKPHOS 98 98 102  --   --   ALT 12 12 13   --   --   AST 16 14* 16  --   --   GLUCOSE 98 107* 120* 99 103*   Recent Labs  Lab 10/20/22 0052  INR 1.2      Imaging Studies: DG Chest Port 1 View  Result Date: 10/23/2022 CLINICAL DATA:  [id number] Pacemaker [id number] EXAM: PORTABLE CHEST 1 VIEW COMPARISON:  April 03, 2022 FINDINGS: The cardiomediastinal silhouette is unchanged in contour.LEFT chest dual lead cardiac pacing device. Atherosclerotic calcifications. Unchanged elevation of the RIGHT hemidiaphragm. No pleural effusion. No pneumothorax. Coarse reticular markings consistent with underlying interstitial lung disease. Is not significantly changed in comparison to prior. No acute pleuroparenchymal abnormality. IMPRESSION:  LEFT chest dual lead cardiac pacing device without evidence of pneumothorax. Electronically Signed   By: Meda Klinefelter M.D.   On: 10/23/2022 13:06   US RENAL  Result Date: 10/23/2022 CLINICAL DATA:  [id number] AKI (acute kidney injury) (HCC) [id number] EXAM:  RENAL / URINARY TRACT ULTRASOUND COMPLETE COMPARISON:  CT 10/17/2022 FINDINGS: Right Kidney: Renal measurements: 10.4 x 5.1 x 4.7 cm = volume: 130.1 mL. No hydronephrosis. Mildly increased renal cortical echogenicity. Left Kidney: Renal measurements: 9.7 x 5.3 x 4.1 cm = volume: 110.5 mL. No hydronephrosis. Mildly increased renal cortical echogenicity. Bladder: Appears normal for degree of bladder distention. Other: None. IMPRESSION: No hydronephrosis. Mildly increased renal cortical echogenicity bilaterally, as can be seen in medical renal disease. Electronically Signed   By: Caprice Renshaw M.D.   On: 10/23/2022 11:00    Assessment:    # Generalized abdominal pain/ epigastric pain - panendoscopy relatively unrevealing - did not reveal same findings as CT. 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 See report for further details. Copy provided to patient as well. This was dicussed with the patient Continue bid ppi for 8 weeks Can try viscous lidocaine prn for abdominal pain. Possibly TCA as outpatient Advance diet to gi soft/low residue as tolerated Resume eliquis 2 days post colonoscopy  Supportive care including pain control and anti emetics as per primary team  EGD, colonoscopy and VCE- all relatively unremarkable for pain etiology  Recommend possible gynecological and surgical workup as outpatient if pain does not resolve  GI to sign off. Available as needed. Please do not hesitate to call regarding  questions or concerns.  Supportive care and symptom control as per primary team  I personally performed the service.  Management of other medical comorbidities as per primary team  Thank you for allowing [place] to participate in this patient's care.  Jaynie Collins, DO [facility]  Portions of the record may have been created with voice recognition software. Occasional wrong-word or 'sound-a-like' substitutions may have occurred due to the inherent limitations of voice recognition software.  Read the chart carefully and recognize, using context, where substitutions may have occurred.

## 2022-10-23 NOTE — Evaluation (Signed)
Physical Therapy Evaluation Patient Details Name: Debra Saunders MRN: [id number] DOB: [birth year] Today's Date: 10/23/2022  History of Present Illness  presented to ER secondary to abdominal pain, abnormal findings on outpatient imaging; admitted for management of possible ulceration of pylorus.  Endoscopy, colonoscopy generally unrevealing (no ulceration, no malignancy); pending capsule endoscopy.  Clinical Impression  Patient resting in bed upon arrival to session; endorses having been OOB to chair for a period of time this AM.  Alert and oriented, follows commands and agreeable to session.  Does report continued abdominal pain, 7/10; meds received prior to session.  Bilat UE/LE strength and ROM grossly symmetrical and WFL; no focal weakness appreciated.  Able to complete bed mobility with mod indep; sit/stand, basic transfers and gait (40') with RW, cga/close sup.  Demonstrates reciprocal stepping pattern; good walker position/use; easily negotiates turns, obstacles without difficulty. Marked SOB/fatigue (BORG 7-8/10) after minimal distance, requiring seated rest period for recovery. Sats 87% with gait efforts, but quickly rebound >90% within 10-15 seconds of seated rest  Would benefit from skilled PT to address above deficits and promote optimal return to PLOF.; recommend post-acute PT services as recommended per interdisciplinary care team.       Recommendations for follow up therapy are one component of a multi-disciplinary discharge planning process, led by the attending physician.  Recommendations may be updated based on patient status, additional functional criteria and insurance authorization.  Follow Up Recommendations       Assistance Recommended at Discharge PRN  Patient can return home with the following  A little help with walking and/or transfers;A little help with bathing/dressing/bathroom    Equipment Recommendations    Recommendations for Other Services       Functional  Status Assessment Patient has had a recent decline in their functional status and demonstrates the ability to make significant improvements in function in a reasonable and predictable amount of time.     Precautions / Restrictions Precautions Precautions: Fall;ICD/Pacemaker Restrictions Weight Bearing Restrictions: No      Mobility  Bed Mobility Overal bed mobility: Modified Independent                  Transfers Overall transfer level: Needs assistance Equipment used: Rolling walker (2 wheels) Transfers: Sit to/from Stand, Bed to chair/wheelchair/BSC Sit to Stand: Supervision Stand pivot transfers: Supervision              Ambulation/Gait Ambulation/Gait assistance: Supervision, Min guard Gait Distance (Feet): 40 Feet Assistive device: Rolling walker (2 wheels)         General Gait Details: reciprocal stepping pattern; good walker position/use; easily negotiates turns, obstacles without difficulty. Marked SOB/fatigue (BORG 7-8/10) after minimal distance, requiring seated rest period for recovery.  Sats 87% with gait efforts, but quickly rebound >90% within 10-15 seconds of seated rest  Stairs            Wheelchair Mobility    Modified Rankin (Stroke Patients Only)       Balance Overall balance assessment: Needs assistance Sitting-balance support: No upper extremity supported, Feet supported Sitting balance-Leahy Scale: Good     Standing balance support: Bilateral upper extremity supported Standing balance-Leahy Scale: Good                               Pertinent Vitals/Pain Pain Assessment Pain Assessment: Faces Pain Score: 7  Pain Location: abdominal pain Pain Descriptors / Indicators: Aching, Grimacing Pain Intervention(s): Monitored during session,  Limited activity within patient's tolerance, Premedicated before session, Repositioned    Home Living Family/patient expects to be discharged to:: Private residence Living  Arrangements: Alone Available Help at Discharge: Family;Available 24 hours/day Type of Home: House Home Access: Ramped entrance       Home Layout: One level Home Equipment: Rollator (4 wheels)      Prior Function Prior Level of Function : Independent/Modified Independent             Mobility Comments: Mod indep with rollator for household distances; 4L O2 at baseline; denies fall history. ADLs Comments: Mod indep with ADLs (tub bench); daughter assists with household chores, errands, meals as needed     Hand Dominance   Dominant Hand: Right    Extremity/Trunk Assessment   Upper Extremity Assessment Upper Extremity Assessment: Overall WFL for tasks assessed    Lower Extremity Assessment Lower Extremity Assessment: Overall WFL for tasks assessed       Communication   Communication: No difficulties  Cognition Arousal/Alertness: Awake/alert Behavior During Therapy: WFL for tasks assessed/performed Overall Cognitive Status: Within Functional Limits for tasks assessed                                          General Comments      Exercises Other Exercises Other Exercises: Assisted with bathing/ADL routine per patient request--set up for upper body bathing/dressing (in seated position); cga/sup for standing balance with RW for peri-care/hygiene; min assist for lower body clothing management (threading LEs in seated position).  Fatigues quickly with efforts.   Assessment/Plan    PT Assessment Patient needs continued PT services  PT Problem List Decreased activity tolerance;Decreased balance;Decreased mobility;Cardiopulmonary status limiting activity;Pain       PT Treatment Interventions DME instruction;Balance training;Therapeutic activities;Functional mobility training;Gait training;Therapeutic exercise;Patient/family education    PT Goals (Current goals can be found in the Care Plan section)  Acute Rehab PT Goals Patient Stated Goal: to return  home PT Goal Formulation: With patient Time For Goal Achievement: 11/06/22 Potential to Achieve Goals: Good    Frequency Min 3X/week     Co-evaluation               AM-PAC PT "6 Clicks" Mobility  Outcome Measure Help needed turning from your back to your side while in a flat bed without using bedrails?: None Help needed moving from lying on your back to sitting on the side of a flat bed without using bedrails?: None Help needed moving to and from a bed to a chair (including a wheelchair)?: A Little Help needed standing up from a chair using your arms (e.g., wheelchair or bedside chair)?: A Little Help needed to walk in hospital room?: A Little Help needed climbing 3-5 steps with a railing? : A Little 6 Click Score: 20    End of Session   Activity Tolerance: Patient tolerated treatment well Patient left: with call bell/phone within reach;with chair alarm set;in chair Nurse Communication: Mobility status PT Visit Diagnosis: Muscle weakness (generalized) (M62.81);Difficulty in walking, not elsewhere classified (R26.2)    Time: 1204-1229 PT Time Calculation (min) (ACUTE ONLY): 25 min   Charges:   PT Evaluation $PT Eval Moderate Complexity: 1 Mod PT Treatments $Therapeutic Activity: 8-22 mins        Keagan Brislin H. Manson Passey, PT, DPT, NCS 10/23/22, 10:44 PM [phone]

## 2022-10-24 ENCOUNTER — Inpatient Hospital Stay: Payer: Medicare HMO

## 2022-10-24 ENCOUNTER — Encounter: Payer: Self-pay | Admitting: Gastroenterology

## 2022-10-24 DIAGNOSIS — R109 Unspecified abdominal pain: Secondary | ICD-10-CM | POA: Diagnosis not present

## 2022-10-24 LAB — BASIC METABOLIC PANEL
Anion gap: 10 (ref 5–15)
BUN: 26 mg/dL — ABNORMAL HIGH (ref 8–23)
CO2: 29 mmol/L (ref 22–32)
Calcium: 9.2 mg/dL (ref 8.9–10.3)
Chloride: 98 mmol/L (ref 98–111)
Creatinine, Ser: 1.59 mg/dL — ABNORMAL HIGH (ref 0.44–1.00)
GFR, Estimated: 33 mL/min — ABNORMAL LOW (ref >60)
Glucose, Bld: 112 mg/dL — ABNORMAL HIGH (ref 70–99)
Potassium: 4.4 mmol/L (ref 3.5–5.1)
Sodium: 137 mmol/L (ref 135–145)

## 2022-10-24 LAB — CBC
HCT: 31.4 % — ABNORMAL LOW (ref 36.0–46.0)
Hemoglobin: 9.6 g/dL — ABNORMAL LOW (ref 12.0–15.0)
MCH: 27.5 pg (ref 26.0–34.0)
MCHC: 30.6 g/dL (ref 30.0–36.0)
MCV: 90 fL (ref 80.0–100.0)
Platelets: 192 10*3/uL (ref 150–400)
RBC: 3.49 MIL/uL — ABNORMAL LOW (ref 3.87–5.11)
RDW: 12.3 % (ref 11.5–15.5)
WBC: 5.1 10*3/uL (ref 4.0–10.5)
nRBC: 0 % (ref 0.0–0.2)

## 2022-10-24 LAB — TROPONIN I (HIGH SENSITIVITY)
Troponin I (High Sensitivity): 21 ng/L — ABNORMAL HIGH (ref <18)
Troponin I (High Sensitivity): 16 ng/L (ref <18)

## 2022-10-24 LAB — HEPARIN LEVEL (UNFRACTIONATED)
Heparin Unfractionated: 0.61 IU/mL (ref 0.30–0.70)
Heparin Unfractionated: 0.77 IU/mL — ABNORMAL HIGH (ref 0.30–0.70)

## 2022-10-24 LAB — SURGICAL PATHOLOGY

## 2022-10-24 LAB — MAGNESIUM: Magnesium: 2 mg/dL (ref 1.7–2.4)

## 2022-10-24 MED ORDER — OXYCODONE HCL 5 MG PO TABS
5.0000 mg | ORAL_TABLET | ORAL | Status: DC | PRN
  Administered 2022-10-24 – 2022-10-26 (×4): 5 mg via ORAL
  Filled 2022-10-24 (×4): qty 1

## 2022-10-24 MED ORDER — APIXABAN 5 MG PO TABS
5.0000 mg | ORAL_TABLET | Freq: Two times a day (BID) | ORAL | Status: DC
  Administered 2022-10-24 – 2022-10-26 (×5): 5 mg via ORAL
  Filled 2022-10-24 (×5): qty 1

## 2022-10-24 MED ORDER — GABAPENTIN 300 MG PO CAPS
300.0000 mg | ORAL_CAPSULE | Freq: Two times a day (BID) | ORAL | Status: DC
  Administered 2022-10-24 – 2022-10-26 (×5): 300 mg via ORAL
  Filled 2022-10-24: qty 1
  Filled 2022-10-24: qty 3
  Filled 2022-10-24 (×3): qty 1

## 2022-10-24 MED ORDER — LIDOCAINE 5 % EX PTCH
2.0000 | MEDICATED_PATCH | CUTANEOUS | Status: DC
  Administered 2022-10-24 – 2022-10-26 (×3): 2 via TRANSDERMAL
  Filled 2022-10-24 (×3): qty 2

## 2022-10-24 MED ORDER — POLYETHYLENE GLYCOL 3350 17 G PO PACK
17.0000 g | PACK | Freq: Every day | ORAL | Status: DC
  Administered 2022-10-25 – 2022-10-26 (×2): 17 g via ORAL
  Filled 2022-10-24 (×3): qty 1

## 2022-10-24 MED ORDER — PANTOPRAZOLE SODIUM 40 MG PO TBEC
40.0000 mg | DELAYED_RELEASE_TABLET | Freq: Two times a day (BID) | ORAL | Status: DC
  Administered 2022-10-24 – 2022-10-26 (×4): 40 mg via ORAL
  Filled 2022-10-24 (×4): qty 1

## 2022-10-24 MED ORDER — ATROPINE SULFATE 1 % OP SOLN
1.0000 [drp] | Freq: Every day | OPHTHALMIC | Status: DC
  Administered 2022-10-24 – 2022-10-25 (×2): 1 [drp] via OPHTHALMIC
  Filled 2022-10-24: qty 2

## 2022-10-24 NOTE — Progress Notes (Addendum)
Mobility Specialist - Progress Note   10/24/22 1119  Mobility  Activity Ambulated with assistance in hallway  Level of Assistance Standby assist, set-up cues, supervision of patient - no hands on  Assistive Device Front wheel walker  Distance Ambulated (ft) 70 ft  Activity Response Tolerated well  $Mobility charge 1 Mobility      Pre-mobility: 61 HR, 98% SpO2 During mobility: 73 HR, 93% SpO2 Post-mobility: 66 HR, 95% SpO2   Pt lying in bed upon arrival, utilizing 3L. Pt reports abdominal pain 6/10 that remained persistent but did not increase with activity. Chest pain. Pt completed bed mobility modI and STS/AMB with supervision. No LOB. Pt ambulated 35' x 2 with supervision. SOB with exertion, but sats maintained > 92% on 4L. Upon return to room pt sat EOB for seated rest break prior to transfer to recliner. Pt left in chair with alarm set, needs in reach. MD and RN entered prior to exit. Pt back on 3L.    Filiberto Pinks Mobility Specialist 10/24/22, 11:28 AM

## 2022-10-24 NOTE — Progress Notes (Addendum)
[facility] Progress Note Patient: Debra Saunders NWG:[id number] DOB: 1946-02-22 DOA: 10/19/2022  DOS: the patient was seen and examined on 10/24/2022  Brief hospital course: PMH of type II DM, obesity, OSA, on CPAP, permanent A-fib, history of DVT in 2014 on anticoagulation, CKD 3B, pulmonary hypertension with chronic respiratory failure on 4 LPM. Presented to the hospital with complaints of abdominal pain for 2 to 3 weeks.  Had outpatient CT abdomen which was concerning for possible peptic ulcer disease.  GI was consulted.  Underwent EGD colonoscopy as well as capsule endoscopy.  All were unremarkable for any explanation of her abdominal pain. Continues to have ongoing abdominal pain and also had some chest pain on 5/3.  Currently plan is for pain control and ensure adequate oral intake. Assessment and Plan: Concern for peptic ulcer disease.  Ruled out Ongoing abdominal pain Has chronic abdominal pain ongoing for last 2 to 3 weeks. Underwent CT scan outside of the hospital which was concerning for ulceration in the pyloric area versus intussusception. GI was consulted.  Underwent EGD which shows no ulceration. Underwent colonoscopy due to anemia as well as ongoing symptoms which is also negative for any acute abnormality. Capsule endoscopy shows no abnormalities. GI currently signed off.  Recommending PPI twice daily for 8 weeks.  Recommend viscous lidocaine for abdominal pain.  Also recommend possible TCA as outpatient. Also recommend outpatient GYN follow-up and if continues to have ongoing abdominal pain General surgery referral. No significant improvement with Bentyl.  Permanent atrial fibrillation on Eliquis Bradycardia Eliquis was on hold.  Patient was on heparin.  Now back on Eliquis 48 hours after colonoscopy. Has pacemaker.  EKG unremarkable.  Chest x-ray unremarkable.   History of bilateral chronic DVTs Eliquis being resumed.  Patient was bridged with heparin.   Chronic  diastolic CHF Pulmonary artery hypertension Chronic hypoxic respiratory failure AKI Euvolemic on exam Last 2D echo done on 08/23/2021 revealed LVEF 60-65% and grade 1 diastolic dysfunction. Resume PAH medications. Continue follow-up with cardiology outpatient. On 4 LPM at home. Diuretics are on hold secondary to AKI.  Baseline serum creatinine around 1.  Serum creatinine jumped to 1.7 on 5/2.  Improving to 1.5 now.  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  Not felt to be candidate for system extraction and so Rx with ABx and lifelong suppression.  Currently on cefadroxil.  Chest pain. Appears to be musculoskeletal in nature as it is clearly reproducible. EKG shows paced rhythm.  Troponins are negative for any acute ischemia. Will attempt lidocaine patch as well as narcotic pain medications.    Obesity Body mass index is 37.77 kg/m.  Placing the patient at high risk of poor outcome.   OSA CPAP nightly   Chronic hypotension Resume home midodrine Maintain MAP greater than 65 Closely monitor vital signs.   Chronic anxiety/depression Stable Resume home regimen.  AKI. Renal function worsening. Holding diuretics. Ultrasound renal unremarkable for any obstruction.  Subjective: Reported to have chest pain since last night.  No nausea no vomiting no fever no chills.  Passing gas but no BM.  Physical Exam: General: in moderate distress, No Rash Cardiovascular: S1 and S2 Present, No Murmur, left-sided reproducible chest pain  Respiratory: Good respiratory effort, Bilateral Air entry present.  Occasional crackles, No wheezes,  Abdomen: Bowel Sound present, diffuse tenderness Extremities: No edema Neuro: Alert and oriented x3, no new focal deficit   Data Reviewed: I have  Reviewed nursing notes, Vitals, and Lab results. Since last encounter, pertinent lab  results CBC and BMP   . I have ordered test including CBC and BMP and troponin. I have ordered imaging chest x-ray. I have independently visualized and interpreted EKG which showed EKG: nonspecific ST and T waves changes paced rhythm.     Disposition: Status is: Inpatient Remains inpatient appropriate because: Monitor for pain control. Marland Kitchenapixaban (ELIQUIS) tablet 5 mg   Family Communication: No one at bedside Level of care: Telemetry Medical   Vitals:   10/24/22 0423 10/24/22 0701 10/24/22 0911 10/24/22 1113  BP: 123/62  (!) 105/54 (!) 109/58  Pulse: 60  64 60  Resp: 18  (!) 21 (!) 21  Temp: (!) 97.3 F (36.3 C)  98 F (36.7 C) 98 F (36.7 C)  TempSrc:      SpO2: 97%  94% 96%  Weight:  87.7 kg    Height:         Author: Lynden Oxford, MD 10/24/2022 4:04 PM  Please look on [email or web address] to find out who is on call.

## 2022-10-24 NOTE — Progress Notes (Signed)
  Transition of Care [facility]) Screening Note   Patient Details  Name: Debra Saunders Date of Birth: 1946-05-01   Transition of Care [facility]) CM/SW Contact:    Truddie Hidden, RN Phone Number: 10/24/2022, 10:00 AM    Transition of Care Department [facility]) has reviewed patient and no TOC needs have been identified at this time. We will continue to monitor patient advancement through interdisciplinary progression rounds. If new patient transition needs arise, please place a TOC consult.

## 2022-10-24 NOTE — Progress Notes (Signed)
ANTICOAGULATION CONSULT NOTE   Pharmacy Consult for Heparin Infusion Indication: atrial fibrillation and history of bilateral DVT   Patient Measurements: Height: 5' (152.4 cm) Weight: 91.5 kg (201 lb 11.5 oz) IBW/kg (Calculated) : 45.5 Heparin Dosing Weight: 66.5 kg   Labs: Recent Labs    10/22/22 0143 10/23/22 0609 10/23/22 1442 10/24/22 0002  HGB 10.2* 9.4*  --  9.6*  HCT 33.8* 31.0*  --  31.4*  PLT 187 192  --  192  APTT 69*  --   --   --   HEPARINUNFRC 0.45 0.63 0.82* 0.61  CREATININE 1.16* 1.65*  --  1.59*    Estimated Creatinine Clearance: 28.9 mL/min (A) (by C-G formula based on SCr of 1.59 mg/dL (H)).  Medical History: Past Medical History:  Diagnosis Date   Arthritis    Atrial fibrillation, persistent (HCC)    a. s/p TEE-guided DCCV on 08/20/2017; b. 06/2021 Recurrent Afib-->amio/TEE/DCCV.   CAD (coronary artery disease)    a. 01/2012 Cath: nonobs dzs; b. 04/2014 MV: No ischemia; c. 09/2017 Cath: Minimal nonobs RCA dzs, otw nl cors.   Cardiorenal syndrome    a. 09/2017 Creat rose to 2.7 w/ diuresis-->HD x 2.   Chronic back pain    Chronic combined systolic (congestive) and diastolic (congestive) heart failure (HCC)    a. Dx 2005 @ [facility];  b. 02/2014 Echo: EF 55-60%; c. 07/2017: EF 30-35%; d. 09/2017 Echo: EF 40-45%; e. 02/2018 Echo: EF 55-60%; f. 08/2019 Echo: EF 60-65%; g. 08/2020 RHC: RA 3, RV 30/1, PA 33/11 (20), PCWP 6; f. 08/2021 Echo: EF 60-65%, no rwma, GrIDD, nl RV fxn, RVSP 40.16mmHg, mod dil LA, mod TR.   Chronic respiratory failure (HCC)    a. on home O2   Gastritis    History of intracranial hemorrhage    a. 6/14 described by neurosurgery as "small frontal posttraumatic SAH " - pt was on xarelto @ the time.   Hyperlipidemia    Hypertension    Interstitial lung disease (HCC)    Lipoma of back    Lymphedema    a. Chronic LLE edema.   NICM (nonischemic cardiomyopathy) (HCC)    a. 09/2017 Echo: EF 40-45%; b. 09/2017 Cath: minor, insignificant CAD; c. 02/2018  Echo: EF 55-60%, Gr1 DD; d. 08/2019 Echo: EF 60-65%, Gr2 DD.   Obesity    PAH (pulmonary artery hypertension) (HCC)    a. 09/2017 Echo: PASP ; b. 09/2017 RHC: RA 23, RV 84/13, PA 84/29, PCWP 20, CO 3.89, CI 1.89. LVEDP 18; c. 08/2020 RHC: RA 3, RV 30/1, PA 33/11 (20), PCWP 6. CO/CI 6.18/3.36. PVR 2.3 WU.   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 06/2021. Veg on PPM lead. Not candidate for extraction-->s/p 6 wks abx.   Sepsis with metabolic encephalopathy (HCC)    Sleep apnea    Streptococcal bacteremia    Stroke [facility]) 2014   Subarachnoid hemorrhage (HCC) 01/19/2013   Symptomatic bradycardia    a. s/p BSX PPM.   Thyroid nodule    Type II diabetes mellitus (HCC)    Urinary incontinence     Assessment: Debra Saunders is a 77 y.o. female presenting with abdominal pain. PMH significant for T2DM, obesity, OSA on CPAP, HTN, anemia, CKD3b, PAF, DVT (2014), CHF, GERD. Patient was on Eliquis PTA per chart review. Last dose of Eliquis 4/28. Patient underwent EGD 4/30 and colonoscopy 5/1. Pharmacy has been consulted to initiate and manage heparin infusion.  Baseline Labs: aPTT 30, PT 14.6, INR 1.2, Hgb 9.1, Hct 30.4, Plt 168   Goal of Therapy:  Heparin level 0.3-0.7 units/ml aPTT 66 - 102 seconds Monitor platelets by anticoagulation protocol: Yes   Date Time aPTT/HL Rate/Comment  4/29 0814 78/---  1000/therapeutic x1 4/29 1547 79/---  1000/therapeutic x2 5/1 0143 69/0.45 1000/therapeutic x3 5/2 0609 HL 0.63 Therapeutic x 1 after restart 5/2 1442 HL 0.82 Supratherapeutic.  5/3 0002 HL 0.61 Therapeutic x 1  Plan:  Heparin level is therapeutic. Will continue heparin infusion at 800 units/hr. Recheck heparin level in 8 hours to confirm. CBC daily while on heparin.     Otelia Sergeant, PharmD, [facility] 10/24/2022 12:47 AM

## 2022-10-24 NOTE — Progress Notes (Addendum)
ANTICOAGULATION CONSULT NOTE   Pharmacy Consult for Heparin Infusion Indication: atrial fibrillation and history of bilateral DVT   Patient Measurements: Height: 5' (152.4 cm) Weight: 87.7 kg (193 lb 6.4 oz) IBW/kg (Calculated) : 45.5 Heparin Dosing Weight: 66.5 kg   Labs: Recent Labs    10/22/22 0143 10/23/22 0609 10/23/22 1442 10/24/22 0002 10/24/22 0904  HGB 10.2* 9.4*  --  9.6*  --   HCT 33.8* 31.0*  --  31.4*  --   PLT 187 192  --  192  --   APTT 69*  --   --   --   --   HEPARINUNFRC 0.45 0.63 0.82* 0.61 0.77*  CREATININE 1.16* 1.65*  --  1.59*  --     Estimated Creatinine Clearance: 28.3 mL/min (A) (by C-G formula based on SCr of 1.59 mg/dL (H)).  Medical History: Past Medical History:  Diagnosis Date   Arthritis    Atrial fibrillation, persistent (HCC)    a. s/p TEE-guided DCCV on 08/20/2017; b. 06/2021 Recurrent Afib-->amio/TEE/DCCV.   CAD (coronary artery disease)    a. 01/2012 Cath: nonobs dzs; b. 04/2014 MV: No ischemia; c. 09/2017 Cath: Minimal nonobs RCA dzs, otw nl cors.   Cardiorenal syndrome    a. 09/2017 Creat rose to 2.7 w/ diuresis-->HD x 2.   Chronic back pain    Chronic combined systolic (congestive) and diastolic (congestive) heart failure (HCC)    a. Dx 2005 @ [facility];  b. 02/2014 Echo: EF 55-60%; c. 07/2017: EF 30-35%; d. 09/2017 Echo: EF 40-45%; e. 02/2018 Echo: EF 55-60%; f. 08/2019 Echo: EF 60-65%; g. 08/2020 RHC: RA 3, RV 30/1, PA 33/11 (20), PCWP 6; f. 08/2021 Echo: EF 60-65%, no rwma, GrIDD, nl RV fxn, RVSP 40.48mmHg, mod dil LA, mod TR.   Chronic respiratory failure (HCC)    a. on home O2   Gastritis    History of intracranial hemorrhage    a. 6/14 described by neurosurgery as "small frontal posttraumatic SAH " - pt was on xarelto @ the time.   Hyperlipidemia    Hypertension    Interstitial lung disease (HCC)    Lipoma of back    Lymphedema    a. Chronic LLE edema.   NICM (nonischemic cardiomyopathy) (HCC)    a. 09/2017 Echo: EF 40-45%; b. 09/2017  Cath: minor, insignificant CAD; c. 02/2018 Echo: EF 55-60%, Gr1 DD; d. 08/2019 Echo: EF 60-65%, Gr2 DD.   Obesity    PAH (pulmonary artery hypertension) (HCC)    a. 09/2017 Echo: PASP ; b. 09/2017 RHC: RA 23, RV 84/13, PA 84/29, PCWP 20, CO 3.89, CI 1.89. LVEDP 18; c. 08/2020 RHC: RA 3, RV 30/1, PA 33/11 (20), PCWP 6. CO/CI 6.18/3.36. PVR 2.3 WU.   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 06/2021. Veg on PPM lead. Not candidate for extraction-->s/p 6 wks abx.   Sepsis with metabolic encephalopathy (HCC)    Sleep apnea    Streptococcal bacteremia    Stroke [facility]) 2014   Subarachnoid hemorrhage (HCC) 01/19/2013   Symptomatic bradycardia    a. s/p BSX PPM.   Thyroid nodule    Type II diabetes mellitus (HCC)    Urinary incontinence     Assessment: Debra Saunders is a 77 y.o. female presenting with abdominal pain. PMH significant for T2DM, obesity, OSA on CPAP, HTN, anemia, CKD3b, PAF, DVT (2014), CHF, GERD. Patient was on Eliquis PTA per chart review. Last dose of Eliquis  4/28. Patient underwent EGD 4/30 and colonoscopy 5/1. Pharmacy has been consulted to initiate and manage heparin infusion.   Baseline Labs: aPTT 30, PT 14.6, INR 1.2, Hgb 9.1, Hct 30.4, Plt 168   Goal of Therapy:  Heparin level 0.3-0.7 units/ml Monitor platelets by anticoagulation protocol: Yes   Date Time aPTT/HL Rate/Comment  4/29 0814 78/---  1000/therapeutic x1 4/29 1547 79/---  1000/therapeutic x2 5/1 0143 69/0.45 1000/therapeutic x3 5/2 0609 HL 0.63 Therapeutic x 1 after restart 5/2 1442 HL 0.82 Supratherapeutic 5/3 0002 HL 0.61 Therapeutic x 1 5/3 0904 HL 0.77 Supratherapeutic  Plan:  Heparin level is supratherapeutic. Decrease heparin infusion to 750 units/hr. Recheck heparin level in 8 hours. CBC daily while on heparin.    Tressie Ellis 10/24/2022 9:50 AM

## 2022-10-24 NOTE — Care Management Important Message (Signed)
Important Message  Patient Details  Name: Debra Saunders MRN: [id number] Date of Birth: [birth year]   Medicare Important Message Given:  Yes     Johnell Comings 10/24/2022, 2:03 PM

## 2022-10-25 DIAGNOSIS — R1013 Epigastric pain: Secondary | ICD-10-CM

## 2022-10-25 LAB — CBC
HCT: 29.8 % — ABNORMAL LOW (ref 36.0–46.0)
Hemoglobin: 9.1 g/dL — ABNORMAL LOW (ref 12.0–15.0)
MCH: 27.8 pg (ref 26.0–34.0)
MCHC: 30.5 g/dL (ref 30.0–36.0)
MCV: 91.1 fL (ref 80.0–100.0)
Platelets: 166 10*3/uL (ref 150–400)
RBC: 3.27 MIL/uL — ABNORMAL LOW (ref 3.87–5.11)
RDW: 12.3 % (ref 11.5–15.5)
WBC: 4.5 10*3/uL (ref 4.0–10.5)
nRBC: 0 % (ref 0.0–0.2)

## 2022-10-25 LAB — MAGNESIUM: Magnesium: 2 mg/dL (ref 1.7–2.4)

## 2022-10-25 LAB — BASIC METABOLIC PANEL
Anion gap: 7 (ref 5–15)
BUN: 28 mg/dL — ABNORMAL HIGH (ref 8–23)
CO2: 28 mmol/L (ref 22–32)
Calcium: 8.8 mg/dL — ABNORMAL LOW (ref 8.9–10.3)
Chloride: 99 mmol/L (ref 98–111)
Creatinine, Ser: 1.32 mg/dL — ABNORMAL HIGH (ref 0.44–1.00)
GFR, Estimated: 41 mL/min — ABNORMAL LOW (ref >60)
Glucose, Bld: 144 mg/dL — ABNORMAL HIGH (ref 70–99)
Potassium: 3.6 mmol/L (ref 3.5–5.1)
Sodium: 134 mmol/L — ABNORMAL LOW (ref 135–145)

## 2022-10-25 NOTE — TOC Initial Note (Signed)
Transition of Care [facility]) - Initial/Assessment Note    Patient Details  Name: Debra Saunders MRN: [id number] Date of Birth: Aug 04, 1945  [facility]) CM/SW Contact:    Darolyn Rua, LCSW Phone Number: 10/25/2022, 8:48 AM  Clinical Narrative:                  CSW spoke with patient's listed POA [name] regarding HH recommendations from PT on 5/2. She reports being in agreement no preference of agency, patient has wheel chair and a walker in the home, no further dme needs identified. Patient's daughter [name] to pick patient up at time of discharge. She confirms patient continues to see Dr. Birdie Sons as PCP and uses [facility] on [address] for pharmacy.   Referral for [facility] PT and RN given to [facility] with Frances Furbish.  Expected Discharge Plan: Home w Home Health Services Barriers to Discharge: Continued Medical Work up   Patient Goals and CMS Choice Patient states their goals for this hospitalization and ongoing recovery are:: to go home CMS Medicare.gov Compare Post Acute Care list provided to:: Patient Represenative (must comment) (POA [name]) Choice offered to / list presented to : [facility] POA / Guardian      Expected Discharge Plan and Services       Living arrangements for the past 2 months: Single Family Home                           HH Arranged: PT, RN HH Agency: [facility] Date [facility] Agency Contacted: 10/25/22 Time HH Agency Contacted: [phone] Representative spoke with at [facility] Agency: Kandee Keen  Prior Living Arrangements/Services Living arrangements for the past 2 months: Single Family Home Lives with:: Relatives   Do you feel safe going back to the place where you live?: Yes               Activities of Daily Living Home Assistive Devices/Equipment: Environmental consultant (specify type), Eyeglasses ADL Screening (condition at time of admission) Patient's cognitive ability adequate to safely complete daily activities?: Yes Is the patient deaf or have difficulty  hearing?: No Does the patient have difficulty seeing, even when wearing glasses/contacts?: No Does the patient have difficulty concentrating, remembering, or making decisions?: No Patient able to express need for assistance with ADLs?: Yes Does the patient have difficulty dressing or bathing?: No Independently performs ADLs?: Yes (appropriate for developmental age) Does the patient have difficulty walking or climbing stairs?: Yes Weakness of Legs: None Weakness of Arms/Hands: None  Permission Sought/Granted                  Emotional Assessment              Admission diagnosis:  Abdominal pain [R10.9] Abdominal pain, unspecified abdominal location [R10.9] Patient Active Problem List   Diagnosis Date Noted   Permanent atrial fibrillation (HCC) 10/20/2022   Vaginal discharge 10/17/2022   Genital warts 10/17/2022   Head injury 08/30/2021   Stage 3b chronic kidney disease (CKD) (HCC) 08/23/2021   Anemia due to chronic kidney disease 08/23/2021   History of endocarditis 08/22/2021   Orthostatic hypotension 08/22/2021   Generalized weakness 08/21/2021   History of COVID-19    Infection due to implanted cardiac device (HCC) 07/25/2021   Abdominal pain 07/06/2021   Pressure injury of skin 07/01/2021   GI bleeding 06/25/2021   Black stool 06/25/2021   HTN (hypertension)    HLD (hyperlipidemia)    Chronic  diastolic CHF (congestive heart failure) (HCC)    Acute renal failure superimposed on stage 3a chronic kidney disease (HCC)    DVT (deep venous thrombosis) (HCC)    Hyponatremia    Thrombocytopenia (HCC)    Diarrhea    Burn 04/30/2020   Obesity (BMI 30-39.9) 04/30/2020   Elevated alkaline phosphatase level 04/30/2020   Chronic diarrhea 11/04/2019   Dysuria 09/29/2019   Abnormal mammogram 09/29/2019   Breast pain, right 08/08/2019   Bilateral ovarian cysts 08/08/2019   B12 deficiency 08/02/2019   Iron deficiency 08/01/2019   Pansinusitis 07/06/2019   Shortness of  breath    Atrial fibrillation with tachy brady syndrome  09/22/2017   Acute bilateral low back pain without sciatica 01/28/2017   Pain in limb 07/22/2016   Neuropathy 06/09/2016   Chronic deep vein thrombosis (DVT) (HCC) 03/03/2016   Chronic back pain 10/19/2015   Goiter 04/25/2015   Cervical disc disorder with radiculopathy of cervical region 03/30/2015   Severe dizziness 09/01/2014   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 [place] ([place]) [place] [address] Phone: [phone] Fax: [phone]  [facility] - [place], [place] - [address] [place] [address] Phone: [phone] Fax: [phone]  [facility] - [address] [place] [address] Phone: [phone] Fax: [phone]  [facility] - [place], [place] - [address] [facility] [address] [place] [address] Phone: [phone] Fax: [phone]  [name] - [place], [place] - [address] [place] [address] Phone: [phone] Fax: [phone]  [facility] [place], [place] - [address] [place] [address] Phone: [phone] Fax: [phone]     Social Determinants of Health (SDOH) Social History: SDOH Screenings   Food Insecurity: No  Food Insecurity (10/20/2022)  Housing: Low Risk  (10/20/2022)  Transportation Needs: Unmet Transportation Needs (10/20/2022)  Utilities: Not At Risk (10/20/2022)  Alcohol Screen: Low Risk  (05/02/2021)  Depression (PHQ2-9): Low Risk  (10/17/2022)  Financial Resource Strain: Low Risk  (02/19/2022)  Physical Activity: Inactive (01/05/2018)  Social Connections: Socially Isolated (02/19/2022)  Stress: No Stress Concern Present (02/19/2022)  Tobacco Use: Medium Risk (10/24/2022)   SDOH Interventions: Transportation Interventions: Inpatient TOC, Other (Comment) (Resources added to AVS.)   Readmission Risk Interventions     No data to display

## 2022-10-25 NOTE — Progress Notes (Signed)
Mobility Specialist - Progress Note   Pre-mobility: SpO2 (97%) on 3L During mobility: SpO2(91%) on 4L Post-mobility:SPO2(92%) 3L     10/25/22 1910  Mobility  Activity Ambulated with assistance in hallway;Stood at bedside;Dangled on edge of bed  Level of Assistance Standby assist, set-up cues, supervision of patient - no hands on  Assistive Device Front wheel walker  Range of Motion/Exercises Active  Activity Response Tolerated well  Mobility Referral Yes  $Mobility charge 1 Mobility   Pt resting in recliner on 3L upon entry. Pt STS and ambulates to hallway around NS on 4L temporarily; SBA with RW. Pt took one standing rest break leaning against railing. Pt returned to recliner in room and left with needs in reach, chair alarm activated. Pt O2 left on 3L.   Johnathan Hausen Mobility Specialist 10/25/22, 7:23 PM

## 2022-10-25 NOTE — Progress Notes (Signed)
PROGRESS NOTE  Debra Saunders UJW:[id number] DOB: 1946-03-22 DOA: 10/19/2022 PCP: Glori Luis, MD  Brief History   PMH of type II DM, obesity, OSA, on CPAP, permanent A-fib, history of DVT in 2014 on anticoagulation, CKD 3B, pulmonary hypertension with chronic respiratory failure on 4 LPM. Presented to the hospital with complaints of abdominal pain for 2 to 3 weeks.  Had outpatient CT abdomen which was concerning for possible peptic ulcer disease.  GI was consulted.  Underwent EGD colonoscopy as well as capsule endoscopy.  All were unremarkable for any explanation of her abdominal pain. Continues to have ongoing abdominal pain and also had some chest pain on 5/3.  Currently plan is for pain control and ensure adequate oral intake.  Pt states that she is feeling better, but that she still has abdominal pain.  A & P  Concern for peptic ulcer disease.  Ruled out Ongoing abdominal pain Has chronic abdominal pain ongoing for last 2 to 3 weeks. Underwent CT scan outside of the hospital which was concerning for ulceration in the pyloric area versus intussusception. GI was consulted.  Underwent EGD which shows no ulceration. Underwent colonoscopy due to anemia as well as ongoing symptoms which is also negative for any acute abnormality. Capsule endoscopy shows no abnormalities. GI currently signed off.  Recommending PPI twice daily for 8 weeks.  Recommend viscous lidocaine for abdominal pain.  Also recommend possible TCA as outpatient. Also recommend outpatient GYN follow-up and if continues to have ongoing abdominal pain General surgery referral. No significant improvement with Bentyl. Will check X-ray of abdomen to rule out constipation.   Permanent atrial fibrillation on Eliquis Bradycardia Eliquis was on hold.  Patient was on heparin.  Now back on Eliquis 48 hours after colonoscopy. Has pacemaker.  EKG unremarkable.  Chest x-ray unremarkable.   History of bilateral chronic  DVTs Eliquis being resumed.  Patient was bridged with heparin.   Chronic diastolic CHF Pulmonary artery hypertension Chronic hypoxic respiratory failure AKI Euvolemic on exam Last 2D echo done on 08/23/2021 revealed LVEF 60-65% and grade 1 diastolic dysfunction. Resume PAH medications. Continue follow-up with cardiology outpatient. On 4 LPM at home. Diuretics are on hold secondary to AKI.  Baseline serum creatinine around 1.  Serum creatinine jumped to 1.7 on 5/2.  Improving to 1.32 on 10/25/2022.   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  Not felt to be candidate for system extraction and so Rx with ABx and lifelong suppression.  Currently on cefadroxil.   Chest pain. Appears to be musculoskeletal in nature as it is clearly reproducible. EKG shows paced rhythm.  Troponins are negative for any acute ischemia. Will attempt lidocaine patch as well as narcotic pain medications.    Obesity Body mass index is 37.77 kg/m.  Placing the patient at high risk of poor outcome.   OSA CPAP nightly   Chronic hypotension Resume home midodrine Maintain MAP greater than 65 Closely monitor vital signs.   Chronic anxiety/depression Stable Resume home regimen.   AKI. Renal function worsening. Holding diuretics. Ultrasound renal unremarkable for any obstruction.  I have seen and examined this patient myself. I have spent 34 minutes in her evaluation and care.  DVT prophylaxis: Eliquis Code Status: Full Family Communication: None available Disposition Plan: tbd    Mesiah Manzo, DO [facility] Direct contact: see [email or web address]  7PM-7AM contact night coverage as above 10/25/2022, 6:06 PM  LOS: 6 days  Consultants  Gastroenterology  Procedures  EGD - no source of bleeding located.  Antibiotics   Anti-infectives (From admission, onward)    Start      Dose/Rate Route Frequency Ordered Stop   10/20/22 0000  cefadroxil (DURICEF) capsule 500 mg        500 mg Oral 2 times daily 10/19/22 2358          Subjective  Reported to have chest pain since last night. No nausea no vomiting no fever no chills. Passing gas but no BM.   Objective   Vitals:  Vitals:   10/25/22 1231 10/25/22 1753  BP: 112/60 (!) 128/56  Pulse: 60 60  Resp: 20 18  Temp: 97.9 F (36.6 C) 98.7 F (37.1 C)  SpO2: 99% 100%    Exam:  Constitutional:  The patient is awake, alert, and oriented x 3. No acute distress. Respiratory:  No increased work of breathing. No wheezes, rales, or rhonchi No tactile fremitus Cardiovascular:  Regular rate and rhythm No murmurs, ectopy, or gallups. No lateral PMI. No thrills. Abdomen:  Abdomen is soft, non-tender, non-distended No hernias, masses, or organomegaly Normoactive bowel sounds.  Musculoskeletal:  No cyanosis, clubbing, or edema Skin:  No rashes, lesions, ulcers palpation of skin: no induration or nodules Neurologic:  CN 2-12 intact Sensation all 4 extremities intact Psychiatric:  Mental status Mood, affect appropriate Orientation to person, place, time  judgment and insight appear intact   I have personally reviewed the following:   Today's Data   Vitals:   10/25/22 1231 10/25/22 1753  BP: 112/60 (!) 128/56  Pulse: 60 60  Resp: 20 18  Temp: 97.9 F (36.6 C) 98.7 F (37.1 C)  SpO2: 99% 100%     Lab Data  CBC    Component Value Date/Time   WBC 4.5 10/25/2022 0326   RBC 3.27 (L) 10/25/2022 0326   HGB 9.1 (L) 10/25/2022 0326   HGB 10.5 (L) 04/02/2018 1548   HCT 29.8 (L) 10/25/2022 0326   HCT 33.4 (L) 04/02/2018 1548   PLT 166 10/25/2022 0326   PLT 220 04/02/2018 1548   MCV 91.1 10/25/2022 0326   MCV 85 04/02/2018 1548   MCV 85 09/12/2014 1753   MCH 27.8 10/25/2022 0326   MCHC 30.5 10/25/2022 0326   RDW 12.3 10/25/2022 0326   RDW 12.8 04/02/2018 1548   RDW 14.0 09/12/2014 1753    LYMPHSABS 1.5 10/17/2022 1139   LYMPHSABS 1.8 04/02/2018 1548   LYMPHSABS 1.8 12/28/2013 1300   MONOABS 0.5 10/17/2022 1139   MONOABS 0.4 12/28/2013 1300   EOSABS 0.2 10/17/2022 1139   EOSABS 0.2 04/02/2018 1548   EOSABS 0.2 12/28/2013 1300   BASOSABS 0.0 10/17/2022 1139   BASOSABS 0.1 04/02/2018 1548   BASOSABS 0.1 12/28/2013 1300      Latest Ref Rng & Units 10/25/2022    3:26 AM 10/24/2022   12:02 AM 10/23/2022    6:09 AM  BMP  Glucose 70 - 99 mg/dL 161  096  045   BUN 8 - 23 mg/dL 28  26  21    Creatinine 0.44 - 1.00 mg/dL 4.09  8.11  9.14   Sodium 135 - 145 mmol/L 134  137  135   Potassium 3.5 - 5.1 mmol/L 3.6  4.4  3.9   Chloride 98 - 111 mmol/L 99  98  97   CO2 22 - 32 mmol/L 28  29  27    Calcium 8.9 - 10.3 mg/dL 8.8  9.2  9.0      Micro Data   Results for orders placed or performed during the hospital encounter of 08/21/21  Resp Panel by RT-PCR (Flu A&B, Covid) Nasopharyngeal Swab     Status: Abnormal   Collection Time: 08/21/21  1:37 PM   Specimen: Nasopharyngeal Swab; Nasopharyngeal(NP) swabs in vial transport medium  Result Value Ref Range Status   SARS Coronavirus 2 by RT PCR POSITIVE (A) NEGATIVE Final    Comment: (NOTE) SARS-CoV-2 target nucleic acids are DETECTED.  The SARS-CoV-2 RNA is generally detectable in upper respiratory specimens during the acute phase of infection. Positive results are indicative of the presence of the identified virus, but do not rule out bacterial infection or co-infection with other pathogens not detected by the test. Clinical correlation with patient history and other diagnostic information is necessary to determine patient infection status. The expected result is Negative.  Fact Sheet for Patients: BloggerCourse.com  Fact Sheet for Healthcare Providers: SeriousBroker.it  This test is not yet approved or cleared by the [place] FDA and  has been authorized for detection  and/or diagnosis of SARS-CoV-2 by FDA under an Emergency Use Authorization (EUA).  This EUA will remain in effect (meaning this test can be used) for the duration of  the COVID-19 declaration under Section 564(b)(1) of the A ct, 21 U.S.C. section 360bbb-3(b)(1), unless the authorization is terminated or revoked sooner.     Influenza A by PCR NEGATIVE NEGATIVE Final   Influenza B by PCR NEGATIVE NEGATIVE Final    Comment: (NOTE) The Xpert Xpress SARS-CoV-2/FLU/RSV plus assay is intended as an aid in the diagnosis of influenza from Nasopharyngeal swab specimens and should not be used as a sole basis for treatment. Nasal washings and aspirates are unacceptable for Xpert Xpress SARS-CoV-2/FLU/RSV testing.  Fact Sheet for Patients: BloggerCourse.com  Fact Sheet for Healthcare Providers: SeriousBroker.it  This test is not yet approved or cleared by the [place] FDA and has been authorized for detection and/or diagnosis of SARS-CoV-2 by FDA under an Emergency Use Authorization (EUA). This EUA will remain in effect (meaning this test can be used) for the duration of the COVID-19 declaration under Section 564(b)(1) of the Act, 21 U.S.C. section 360bbb-3(b)(1), unless the authorization is terminated or revoked.  Performed at [facility], [address] [place], [place] [address]   Culture, blood (Routine X 2) w Reflex to ID Panel     Status: None   Collection Time: 08/21/21  3:15 PM   Specimen: BLOOD  Result Value Ref Range Status   Specimen Description BLOOD LEFT ANTECUBITAL  Final   Special Requests BOTTLES DRAWN AEROBIC AND ANAEROBIC BCAV  Final   Culture   Final    NO GROWTH 5 DAYS Performed at [facility], [address] [place], [place] [address]    Report Status 08/26/2021 FINAL  Final  Culture, blood (Routine X 2) w Reflex to ID Panel     Status: None   Collection Time: 08/21/21  3:20 PM    Specimen: BLOOD  Result Value Ref Range Status   Specimen Description BLOOD RIGHT ANTECUBITAL  Final   Special Requests BOTTLES DRAWN AEROBIC AND ANAEROBIC BCAV  Final   Culture   Final    NO GROWTH 5 DAYS Performed at [facility], [address], [place], [place] [address]    Report Status 08/26/2021 FINAL  Final  MRSA Next Gen by PCR, Nasal     Status: None   Collection Time: 08/26/21  8:48 PM   Specimen: Nasal Mucosa; Nasal Swab  Result Value Ref Range Status   MRSA by PCR Next Gen NOT DETECTED NOT DETECTED Final    Comment: (NOTE) [name] (FDA approved for NASAL specimens only), is one component of a comprehensive MRSA colonization surveillance program. It is not intended to diagnose MRSA infection nor to guide or monitor treatment for MRSA infections. Test performance is not FDA approved in patients less than 59 years old. Performed at [facility], [address] [place], [place] [address]    *Note: Due to a large number of results and/or encounters for the requested time period, some results have not been displayed. A complete set of results can be found in Results Review.    Scheduled Meds:  apixaban  5 mg Oral BID   atropine  1 drop Both Eyes QHS   cefadroxil  500 mg Oral BID   DULoxetine  60 mg Oral Daily   gabapentin  300 mg Oral BID   lidocaine  2 patch Transdermal Q24H   macitentan  10 mg Oral Daily   midodrine  5 mg Oral BID WC   pantoprazole  40 mg Oral BID   polyethylene glycol  17 g Oral Daily   rosuvastatin  10 mg Oral Daily   Selexipag  1,600 mcg Oral BID   sildenafil  20 mg Oral TID   Continuous Infusions:  Principal Problem:   Abdominal pain Active Problems:   Pulmonary hypertension, unspecified (HCC)   Chronic diastolic CHF (congestive heart failure) (HCC)   Permanent atrial fibrillation (HCC)   LOS: 6 days

## 2022-10-26 LAB — CBC
HCT: 29 % — ABNORMAL LOW (ref 36.0–46.0)
Hemoglobin: 8.6 g/dL — ABNORMAL LOW (ref 12.0–15.0)
MCH: 27.6 pg (ref 26.0–34.0)
MCHC: 29.7 g/dL — ABNORMAL LOW (ref 30.0–36.0)
MCV: 92.9 fL (ref 80.0–100.0)
Platelets: 141 10*3/uL — ABNORMAL LOW (ref 150–400)
RBC: 3.12 MIL/uL — ABNORMAL LOW (ref 3.87–5.11)
RDW: 12.4 % (ref 11.5–15.5)
WBC: 5.1 10*3/uL (ref 4.0–10.5)
nRBC: 0 % (ref 0.0–0.2)

## 2022-10-26 LAB — BASIC METABOLIC PANEL
Anion gap: 8 (ref 5–15)
BUN: 32 mg/dL — ABNORMAL HIGH (ref 8–23)
CO2: 24 mmol/L (ref 22–32)
Calcium: 8.4 mg/dL — ABNORMAL LOW (ref 8.9–10.3)
Chloride: 102 mmol/L (ref 98–111)
Creatinine, Ser: 1.26 mg/dL — ABNORMAL HIGH (ref 0.44–1.00)
GFR, Estimated: 43 mL/min — ABNORMAL LOW (ref >60)
Glucose, Bld: 129 mg/dL — ABNORMAL HIGH (ref 70–99)
Potassium: 3.6 mmol/L (ref 3.5–5.1)
Sodium: 134 mmol/L — ABNORMAL LOW (ref 135–145)

## 2022-10-26 MED ORDER — OXYCODONE HCL 5 MG PO TABS: 5.0000 mg | ORAL_TABLET | ORAL | 0 refills | Status: DC | PRN

## 2022-10-26 MED ORDER — LIDOCAINE VISCOUS HCL 2 % MT SOLN: 15.0000 mL | Freq: Four times a day (QID) | OROMUCOSAL | 0 refills | Status: DC | PRN

## 2022-10-26 MED ORDER — PANTOPRAZOLE SODIUM 40 MG PO TBEC: 40.0000 mg | DELAYED_RELEASE_TABLET | Freq: Two times a day (BID) | ORAL | 0 refills | Status: DC

## 2022-10-26 MED ORDER — GABAPENTIN 300 MG PO CAPS: 300.0000 mg | ORAL_CAPSULE | Freq: Two times a day (BID) | ORAL | 0 refills | Status: DC

## 2022-10-26 MED ORDER — POLYETHYLENE GLYCOL 3350 17 G PO PACK: 17.0000 g | PACK | Freq: Every day | ORAL | 0 refills | Status: DC

## 2022-10-26 MED ORDER — LIDOCAINE 5 % EX PTCH: 2.0000 | MEDICATED_PATCH | CUTANEOUS | 0 refills | Status: DC

## 2022-10-26 NOTE — Discharge Summary (Signed)
Physician Discharge Summary   Patient: Debra Saunders MRN: [id number] DOB: [birth year]  Admit date:     10/19/2022  Discharge date: 10/26/22  Discharge Physician: Fran Lowes   PCP: Glori Luis, MD   Recommendations at discharge:    Discharge to home with home health PT and RN. Follow up with PCP in 7-10 days. Follow up with GI in two months for colonoscopy.  Discharge Diagnoses: Principal Problem:   Abdominal pain Active Problems:   Pulmonary hypertension, unspecified (HCC)   Chronic diastolic CHF (congestive heart failure) (HCC)   Permanent atrial fibrillation (HCC)  Resolved Problems:   * No resolved hospital problems. [facility] Course: PMH of type II DM, obesity, OSA, on CPAP, permanent A-fib, history of DVT in 2014 on anticoagulation, CKD 3B, pulmonary hypertension with chronic respiratory failure on 4 LPM. Presented to the hospital with complaints of abdominal pain for 2 to 3 weeks.  Had outpatient CT abdomen which was concerning for possible peptic ulcer disease.  GI was consulted.  Underwent EGD colonoscopy as well as capsule endoscopy.  All were unremarkable for any explanation of her abdominal pain. Continues to have ongoing abdominal pain and also had some chest pain on 5/3.  Currently plan is for pain control and ensure adequate oral intake.  Assessment and Plan: No notes have been filed under this hospital service. Service: Hospitalist        Consultants: Gastroenterology Procedures performed: EGD  Disposition: Home health Diet recommendation:  Discharge Diet Orders (From admission, onward)     Start     Ordered   10/26/22 0000  Diet - low sodium heart healthy        10/26/22 1045           Cardiac diet DISCHARGE MEDICATION: Allergies as of 10/26/2022       Reactions   Aspirin Anaphylaxis, Hives   Pt states that she is unable to take because she had bleeding in her brain   Other Other (See Comments)   Pt states that she is unable to  take blood thinners because she had bleeding in her brain; Blood Thinners-per doctor at [facility] (Xarelto)        Medication List     STOP taking these medications    Difluprednate 0.05 % Emul   doxycycline 100 MG tablet Commonly known as: ADOXA   metolazone 2.5 MG tablet Commonly known as: ZAROXOLYN   potassium chloride SA 20 MEQ tablet Commonly known as: KLOR-CON M   spironolactone 25 MG tablet Commonly known as: ALDACTONE   torsemide 20 MG tablet Commonly known as: DEMADEX       TAKE these medications    acetaminophen 325 MG tablet Commonly known as: TYLENOL Take 325-650 mg by mouth every 6 (six) hours as needed for mild pain.   albuterol 0.63 MG/3ML nebulizer solution Commonly known as: ACCUNEB Take 1 ampule by nebulization every 6 (six) hours as needed for wheezing.   apixaban 5 MG Tabs tablet Commonly known as: Eliquis Take 1 tablet (5 mg total) by mouth 2 (two) times daily.   atropine 1 % ophthalmic solution Place 1 drop into both eyes at bedtime.   BIOFREEZE EX Apply 1 application topically 4 (four) times daily as needed (for pain).   cefadroxil 500 MG capsule Commonly known as: DURICEF Take 1 capsule (500 mg total) by mouth 2 (two) times daily.   DULoxetine 60 MG capsule Commonly known as: CYMBALTA Take 60 mg by mouth daily.  gabapentin 300 MG capsule Commonly known as: NEURONTIN Take 1 capsule (300 mg total) by mouth 2 (two) times daily. What changed:  how much to take when to take this   lidocaine 2 % solution Commonly known as: XYLOCAINE Use as directed 15 mLs in the mouth or throat every 6 (six) hours as needed (Abdominal pain).   lidocaine 5 % Commonly known as: LIDODERM Place 2 patches onto the skin daily. Remove & Discard patch within 12 hours or as directed by MD   macitentan 10 MG tablet Commonly known as: OPSUMIT Take 1 tablet (10 mg total) by mouth daily.   meclizine 25 MG tablet Commonly known as: ANTIVERT Take 1  tablet (25 mg total) by mouth 3 (three) times daily as needed for dizziness.   midodrine 5 MG tablet Commonly known as: PROAMATINE Take one tablet at 12 noon, 4pm and 8pm   oxyCODONE 5 MG immediate release tablet Commonly known as: Oxy IR/ROXICODONE Take 1 tablet (5 mg total) by mouth every 4 (four) hours as needed for moderate pain, breakthrough pain or severe pain.   pantoprazole 40 MG tablet Commonly known as: PROTONIX Take 1 tablet (40 mg total) by mouth 2 (two) times daily.   polyethylene glycol 17 g packet Commonly known as: MIRALAX / GLYCOLAX Take 17 g by mouth daily. Start taking on: Oct 27, 2022   rosuvastatin 10 MG tablet Commonly known as: CRESTOR Take 1 tablet (10 mg total) by mouth daily.   sildenafil 20 MG tablet Commonly known as: REVATIO Take 1 tablet (20 mg total) by mouth 3 (three) times daily.   Uptravi 1600 MCG Tabs Generic drug: Selexipag Take 1 tablet (1,600 mcg total) by mouth 2 (two) times daily.        Discharge Exam: Filed Weights   10/24/22 0701 10/25/22 0422 10/26/22 0359  Weight: 87.7 kg 88.5 kg 89.2 kg   Exam:  Constitutional:  The patient is awake, alert, and oriented x 3. No acute distress. Respiratory:  No increased work of breathing. No wheezes, rales, or rhonchi No tactile fremitus Cardiovascular:  Regular rate and rhythm No murmurs, ectopy, or gallups. No lateral PMI. No thrills. Abdomen:  Abdomen is soft, non-tender, non-distended No hernias, masses, or organomegaly Normoactive bowel sounds.  Musculoskeletal:  No cyanosis, clubbing, or edema Skin:  No rashes, lesions, ulcers palpation of skin: no induration or nodules Neurologic:  CN 2-12 intact Sensation all 4 extremities intact Psychiatric:  Mental status Mood, affect appropriate Orientation to person, place, time  judgment and insight appear intact   Condition at discharge: good  The results of significant diagnostics from this hospitalization (including  imaging, microbiology, ancillary and laboratory) are listed below for reference.   Imaging Studies: DG Chest 2 View  Result Date: 10/24/2022 CLINICAL DATA:  Chest pain EXAM: CHEST - 2 VIEW COMPARISON:  Previous studies including the chest radiograph done on 10/23/2022 FINDINGS: Transverse diameter of heart is slightly increased. Pacemaker battery is seen in left infraclavicular region. There is decreased volume with increased markings in right upper lung field with no significant change. There are no new focal infiltrates or signs of pulmonary edema. There is no significant pleural effusion or pneumothorax. IMPRESSION: Scarring in the right upper lung field has not changed. There are no new infiltrates or signs of pulmonary edema. Electronically Signed   By: Ernie Avena M.D.   On: 10/24/2022 17:33   DG Chest Port 1 View  Result Date: 10/23/2022 CLINICAL DATA:  [id number] Pacemaker [id number] EXAM:  PORTABLE CHEST 1 VIEW COMPARISON:  April 03, 2022 FINDINGS: The cardiomediastinal silhouette is unchanged in contour.LEFT chest dual lead cardiac pacing device. Atherosclerotic calcifications. Unchanged elevation of the RIGHT hemidiaphragm. No pleural effusion. No pneumothorax. Coarse reticular markings consistent with underlying interstitial lung disease. Is not significantly changed in comparison to prior. No acute pleuroparenchymal abnormality. IMPRESSION: LEFT chest dual lead cardiac pacing device without evidence of pneumothorax. Electronically Signed   By: Meda Klinefelter M.D.   On: 10/23/2022 13:06   US RENAL  Result Date: 10/23/2022 CLINICAL DATA:  [id number] AKI (acute kidney injury) (HCC) [id number] EXAM: RENAL / URINARY TRACT ULTRASOUND COMPLETE COMPARISON:  CT 10/17/2022 FINDINGS: Right Kidney: Renal measurements: 10.4 x 5.1 x 4.7 cm = volume: 130.1 mL. No hydronephrosis. Mildly increased renal cortical echogenicity. Left Kidney: Renal measurements: 9.7 x 5.3 x 4.1 cm = volume: 110.5 mL. No  hydronephrosis. Mildly increased renal cortical echogenicity. Bladder: Appears normal for degree of bladder distention. Other: None. IMPRESSION: No hydronephrosis. Mildly increased renal cortical echogenicity bilaterally, as can be seen in medical renal disease. Electronically Signed   By: Caprice Renshaw M.D.   On: 10/23/2022 11:00   CT Abdomen Pelvis W Contrast  Result Date: 10/17/2022 CLINICAL DATA:  Upper abdominal pain for 2 weeks, nausea, vomiting common diarrhea. EXAM: CT ABDOMEN AND PELVIS WITH CONTRAST TECHNIQUE: Multidetector CT imaging of the abdomen and pelvis was performed using the standard protocol following bolus administration of intravenous contrast. RADIATION DOSE REDUCTION: This exam was performed according to the departmental dose-optimization program which includes automated exposure control, adjustment of the mA and/or kV according to patient size and/or use of iterative reconstruction technique. CONTRAST:  OMNIPAQUE IOHEXOL 300 MG/ML  SOLN COMPARISON:  CT abdomen/pelvis 06-2021 FINDINGS: Lower chest: Subpleural fibrotic change in the lung bases is similar to the prior study. Cardiac device leads are partially imaged. There is mild calcification of the aortic valve. The imaged heart is otherwise unremarkable. Hepatobiliary: The liver is unremarkable. The gallbladder is surgically absent, likely accounting for the mild biliary ductal prominence. Pancreas: There are subcentimeter cystic lesions in the pancreatic body (2-33) and uncinate process (2-38), not definitely seen on prior studies. Spleen: Unremarkable. Adrenals/Urinary Tract: The adrenals are unremarkable. The kidneys are unremarkable, with no focal lesion, stone, hydronephrosis, or hydroureter. There is symmetric excretion of contrast into the collecting systems on the delayed images. Stomach/Bowel: There is possible short-segment intussusception or ulceration in the region of the pylorus (2-30, 4-59). The stomach and duodenum are  otherwise unremarkable. There is no evidence of bowel obstruction. There is colonic diverticulosis without evidence of acute diverticulitis. The appendix is not identified. Vascular/Lymphatic: There is calcified plaque in the nonaneurysmal abdominal aorta. The major branch vessels are patent. The main portal and splenic veins are patent. An IVC filter is in place. Patency is not assessed due to bolus timing. There is no abdominopelvic lymphadenopathy. Reproductive: The uterus is surgically absent. There is a 4.4 cm simple left adnexal cyst and a 2.2 cm right adnexal cyst. There is possible right hydrosalpinx. The cysts are increased in size since 2023. Other: There is no ascites or free air. Musculoskeletal: There is no acute osseous abnormality or suspicious osseous lesion. IMPRESSION: 1. Possible short-segment intussusception or ulceration in the region of the pylorus. Recommend correlation with endoscopy. 2. Subcentimeter cystic lesions in the pancreatic body and uncinate process, not definitely seen on prior studies. Recommend follow-up MRI/MRCP with and without contrast in 2 years. 3. Colonic diverticulosis without evidence of acute diverticulitis.  4. Bilateral adnexal cysts, increased in size since 2023. Recommend follow-up pelvic ultrasound in 6-12 months. Electronically Signed   By: Lesia Hausen M.D.   On: 10/17/2022 13:24    Microbiology: Results for orders placed or performed during the hospital encounter of 08/21/21  Resp Panel by RT-PCR (Flu A&B, Covid) Nasopharyngeal Swab     Status: Abnormal   Collection Time: 08/21/21  1:37 PM   Specimen: Nasopharyngeal Swab; Nasopharyngeal(NP) swabs in vial transport medium  Result Value Ref Range Status   SARS Coronavirus 2 by RT PCR POSITIVE (A) NEGATIVE Final    Comment: (NOTE) SARS-CoV-2 target nucleic acids are DETECTED.  The SARS-CoV-2 RNA is generally detectable in upper respiratory specimens during the acute phase of infection. Positive results  are indicative of the presence of the identified virus, but do not rule out bacterial infection or co-infection with other pathogens not detected by the test. Clinical correlation with patient history and other diagnostic information is necessary to determine patient infection status. The expected result is Negative.  Fact Sheet for Patients: BloggerCourse.com  Fact Sheet for Healthcare Providers: SeriousBroker.it  This test is not yet approved or cleared by the [place] FDA and  has been authorized for detection and/or diagnosis of SARS-CoV-2 by FDA under an Emergency Use Authorization (EUA).  This EUA will remain in effect (meaning this test can be used) for the duration of  the COVID-19 declaration under Section 564(b)(1) of the A ct, 21 U.S.C. section 360bbb-3(b)(1), unless the authorization is terminated or revoked sooner.     Influenza A by PCR NEGATIVE NEGATIVE Final   Influenza B by PCR NEGATIVE NEGATIVE Final    Comment: (NOTE) The Xpert Xpress SARS-CoV-2/FLU/RSV plus assay is intended as an aid in the diagnosis of influenza from Nasopharyngeal swab specimens and should not be used as a sole basis for treatment. Nasal washings and aspirates are unacceptable for Xpert Xpress SARS-CoV-2/FLU/RSV testing.  Fact Sheet for Patients: BloggerCourse.com  Fact Sheet for Healthcare Providers: SeriousBroker.it  This test is not yet approved or cleared by the [place] FDA and has been authorized for detection and/or diagnosis of SARS-CoV-2 by FDA under an Emergency Use Authorization (EUA). This EUA will remain in effect (meaning this test can be used) for the duration of the COVID-19 declaration under Section 564(b)(1) of the Act, 21 U.S.C. section 360bbb-3(b)(1), unless the authorization is terminated or revoked.  Performed at [facility], [address] [place], [place] [address]   Culture, blood (Routine X 2) w Reflex to ID Panel     Status: None   Collection Time: 08/21/21  3:15 PM   Specimen: BLOOD  Result Value Ref Range Status   Specimen Description BLOOD LEFT ANTECUBITAL  Final   Special Requests BOTTLES DRAWN AEROBIC AND ANAEROBIC BCAV  Final   Culture   Final    NO GROWTH 5 DAYS Performed at [facility] [address], [place], [place] [address]    Report Status 08/26/2021 FINAL  Final  Culture, blood (Routine X 2) w Reflex to ID Panel     Status: None   Collection Time: 08/21/21  3:20 PM   Specimen: BLOOD  Result Value Ref Range Status   Specimen Description BLOOD RIGHT ANTECUBITAL  Final   Special Requests BOTTLES DRAWN AEROBIC AND ANAEROBIC BCAV  Final   Culture   Final    NO GROWTH 5 DAYS Performed at [facility], [address] [place], [place] [address]  Report Status 08/26/2021 FINAL  Final  MRSA Next Gen by PCR, Nasal     Status: None   Collection Time: 08/26/21  8:48 PM   Specimen: Nasal Mucosa; Nasal Swab  Result Value Ref Range Status   MRSA by PCR Next Gen NOT DETECTED NOT DETECTED Final    Comment: (NOTE) [name] (FDA approved for NASAL specimens only), is one component of a comprehensive MRSA colonization surveillance program. It is not intended to diagnose MRSA infection nor to guide or monitor treatment for MRSA infections. Test performance is not FDA approved in patients less than 52 years old. Performed at [facility], [address] [place], [place] [address]    *Note: Due to a large number of results and/or encounters for the requested time period, some results have not been displayed. A complete set of results can be found in Results Review.    Labs: CBC: Recent Labs  Lab 10/22/22 0143 10/23/22 0609 10/24/22 0002 10/25/22 0326 10/26/22 0554  WBC 5.3 5.6 5.1 4.5 5.1  HGB 10.2* 9.4* 9.6* 9.1* 8.6*  HCT 33.8* 31.0* 31.4* 29.8* 29.0*   MCV 91.8 90.6 90.0 91.1 92.9  PLT 187 192 192 166 141*   Basic Metabolic Panel: Recent Labs  Lab 10/20/22 0423 10/21/22 0404 10/22/22 0143 10/23/22 0609 10/24/22 0002 10/25/22 0326 10/26/22 0554  NA 141 138 136 135 137 134* 134*  K 2.9* 3.9 3.9 3.9 4.4 3.6 3.6  CL 101 101 100 97* 98 99 102  CO2 32 29 29 27 29 28 24   GLUCOSE 107* 120* 99 103* 112* 144* 129*  BUN 23 19 18 21  26* 28* 32*  CREATININE 0.96 0.94 1.16* 1.65* 1.59* 1.32* 1.26*  CALCIUM 8.7* 9.0 9.0 9.0 9.2 8.8* 8.4*  MG 1.9 1.9  --  1.8 2.0 2.0  --   PHOS 3.3  --   --   --   --   --   --    Liver Function Tests: Recent Labs  Lab 10/19/22 1847 10/20/22 0423 10/21/22 0404  AST 16 14* 16  ALT 12 12 13   ALKPHOS 98 98 102  BILITOT 0.6 0.8 0.6  PROT 6.7 6.6 6.7  ALBUMIN 3.7 3.8 3.7   CBG: Recent Labs  Lab 10/22/22 1453  GLUCAP 74    Discharge time spent: greater than 30 minutes.  Signed: Jamarii Banks, DO [facility] 10/26/2022

## 2022-10-26 NOTE — Progress Notes (Signed)
PT Cancellation Note  Patient Details Name: Debra Saunders MRN: [id number] DOB: September 11, 1945   Cancelled Treatment:    Reason Eval/Treat Not Completed: Other (comment)  Offered and encouraged session this am.  Pt is awaiting discharge and wishes to save her energy for home.  She does voice some concern over mobility and staying home alone "I'll be all right,"  Encoruaged pt to have +1 assist initially upon discharge.  Continued to decline interventions at this time.   Danielle Dess 10/26/2022, 11:18 AM

## 2022-10-26 NOTE — Progress Notes (Signed)
Patient daughter and POA Ms carmen notified of discharge. No Legal guardianship as per daughter

## 2022-10-27 ENCOUNTER — Telehealth: Payer: Self-pay | Admitting: Family Medicine

## 2022-10-27 ENCOUNTER — Telehealth: Payer: Self-pay | Admitting: *Deleted

## 2022-10-27 NOTE — Telephone Encounter (Signed)
I am not sure why they prescribed this for her abdominal pain. It can be discontinued.

## 2022-10-27 NOTE — Telephone Encounter (Signed)
Pt called in staying that she just left the hospital and the nurse told her that med lidocaine (XYLOCAINE) 2 % solution needs to be change to another one. As per pt, this med can cause numb in the throat and mouth, causing her to not to swallow which can lead to her shocking. And since she leave alone, this med can be a problem. As per pt, she will like to f/u with a nurse concerning this.

## 2022-10-27 NOTE — Transitions of Care (Post Inpatient/ED Visit) (Signed)
10/27/2022  Name: Debra Saunders MRN: [id number] DOB: Aug 05, 1945  Today's TOC FU Call Status: Today's TOC FU Call Status:: Successful TOC FU Call Competed TOC FU Call Complete Date: 10/27/22  Transition Care Management Follow-up Telephone Call Date of Discharge: 10/26/22 Discharge Facility: [facility]) Type of Discharge: Inpatient Admission Primary Inpatient Discharge Diagnosis:: abd pain How have you been since you were released from the hospital?: Same Any questions or concerns?: No  Items Reviewed: Did you receive and understand the discharge instructions provided?: Yes Medications obtained,verified, and reconciled?: Yes (Medications Reviewed) Any new allergies since your discharge?: No Dietary orders reviewed?: Yes Type of Diet Ordered:: low sodium heart healthy (discussed bland foods since patient still having abd pain) Do you have support at home?: Yes People in Home: alone Name of Support/Comfort Primary Source: [name]  Medications Reviewed Today: Medications Reviewed Today     Reviewed by Jaynie Collins, DO (Physician) on 10/22/22 at 1519  Med List Status: Complete   Medication Order Taking? Sig Documenting Provider Last Dose Status Informant  0.9 %  sodium chloride infusion [id number]   Jaynie Collins, DO  Active   acetaminophen (TYLENOL) 325 MG tablet [id number] Yes Take 325-650 mg by mouth every 6 (six) hours as needed for mild pain. [provider] unk unk Active Self, Other, Pharmacy Records, Multiple Informants           Med Note Suezanne Cheshire   Tue Dec 25, 2020  3:51 PM)    acetaminophen (TYLENOL) tablet 500 mg [id number]   Darlin Drop, DO  Active   albuterol (ACCUNEB) 0.63 MG/3ML nebulizer solution [id number] Yes Take 1 ampule by nebulization every 6 (six) hours as needed for wheezing. [provider] unk unk Active Self, Other, Pharmacy Records, Multiple Informants  apixaban (ELIQUIS) 5 MG TABS  tablet [id number] Yes Take 1 tablet (5 mg total) by mouth 2 (two) times daily. Laurey Morale, MD 10/19/2022 Active Other, Pharmacy Records, Multiple Informants  atropine 1 % ophthalmic solution [id number] Yes Place 1 drop into both eyes at bedtime. [provider] Past Week Active Other, Pharmacy Records, Multiple Informants  cefadroxil (DURICEF) 500 MG capsule [id number] Yes Take 1 capsule (500 mg total) by mouth 2 (two) times daily. Raymondo Band, MD Past Week Active Self, Other, Pharmacy Records, Multiple Informants  cefadroxil (DURICEF) capsule 500 mg [id number]   Darlin Drop, DO  Active   Difluprednate 0.05 % EMUL [id number] Yes Place 1 drop into both eyes 3 (three) times daily. [provider] Past Week Active Self, Other, Pharmacy Records, Multiple Informants  doxycycline (ADOXA) 100 MG tablet [id number] No Take 1 tablet (100 mg total) by mouth 2 (two) times daily.  Patient not taking: Reported on 10/19/2022   Jacklynn Ganong, FNP Not Taking Active Other, Pharmacy Records, Multiple Informants  DULoxetine (CYMBALTA) 60 MG capsule [id number] Yes Take 60 mg by mouth daily. [provider] Past Week Active Other, Pharmacy Records, Multiple Informants  DULoxetine (CYMBALTA) DR capsule 60 mg [id number]   Darlin Drop, DO  Active   fluconazole (DIFLUCAN) 150 MG tablet [id number]  Take 1 tablet (150 mg total) by mouth every 3 (three) days for 2 doses. Glori Luis, MD  Active   gabapentin (NEURONTIN) 300 MG capsule [id number] Yes Take 2 capsules (600 mg total) by mouth 3 (three) times daily.  Patient taking differently: Take 300 mg by mouth 2 (two) times daily. once a day   [place],  Yehuda Mao, MD Past Week Active Self, Other, Pharmacy Records, Multiple Informants  gabapentin (NEURONTIN) capsule 300 mg [id number]   Lurene Shadow, MD  Active   HYDROmorphone (DILAUDID) injection 0.5 mg [id number]   Darlin Drop, DO  Active   macitentan (OPSUMIT) 10 MG tablet [id number] Yes  Take 1 tablet (10 mg total) by mouth daily. Laurey Morale, MD Past Week Active Other, Pharmacy Records, Multiple Informants  macitentan (OPSUMIT) tablet 10 mg [id number]   Darlin Drop, DO  Active   meclizine (ANTIVERT) 25 MG tablet [id number] Yes Take 1 tablet (25 mg total) by mouth 3 (three) times daily as needed for dizziness. Laurey Morale, MD unk Arlana Pouch Active Self, Other, Pharmacy Records, Multiple Informants  melatonin tablet 5 mg [id number]   Darlin Drop, DO  Active   Menthol, Topical Analgesic, [facility] EX) [id number] Yes Apply 1 application topically 4 (four) times daily as needed (for pain). [provider] unk unk Active Self, Other, Pharmacy Records, Multiple Informants  metolazone (ZAROXOLYN) 2.5 MG tablet [id number] Yes Take 2.5 mg by mouth every Monday, Wednesday, and Friday. [provider] Past Week Active Other, Pharmacy Records, Multiple Informants  metolazone (ZAROXOLYN) tablet 2.5 mg [id number]   Lurene Shadow, MD  Active   midodrine (PROAMATINE) 5 MG tablet [id number] Yes Take one tablet at 12 noon, 4pm and 8pm Duke Salvia, MD Past Week Active Other, Pharmacy Records, Multiple Informants  midodrine (PROAMATINE) tablet 5 mg [id number]   Darlin Drop, DO  Active   Oral care mouth rinse [id number]   Darlin Drop, DO  Active   oxyCODONE (Oxy IR/ROXICODONE) immediate release tablet 5 mg [id number]   Darlin Drop, DO  Active   pantoprazole (PROTONIX) injection 40 mg [id number]   Darlin Drop, DO  Active   polyethylene glycol [facility] / GLYCOLAX) packet 17 g [id number]   Darlin Drop, DO  Active   polyethylene glycol-electrolytes (NuLYTELY) solution 2,000 mL [id number]   Jaynie Collins, DO  Active   potassium chloride SA (KLOR-CON M) 20 MEQ tablet [id number] Yes Take 2 tablets (40 mEq total) by mouth daily. [place], Anderson Malta, FNP Past Week Active Other, Pharmacy Records, Multiple Informants  potassium chloride SA (KLOR-CON M) CR tablet 40 mEq  [id number]   Lurene Shadow, MD  Active   prochlorperazine (COMPAZINE) injection 5 mg [id number]   Darlin Drop, DO  Active   rosuvastatin (CRESTOR) 10 MG tablet [id number] Yes Take 1 tablet (10 mg total) by mouth daily. Laurey Morale, MD Past Week Active Self, Other, Pharmacy Records, Multiple Informants  rosuvastatin (CRESTOR) tablet 10 mg [id number]   Lurene Shadow, MD  Active   Selexipag (UPTRAVI) 1600 MCG TABS [id number] Yes Take 1 tablet (1,600 mcg total) by mouth 2 (two) times daily. Laurey Morale, MD Past Week Active Self, Other, Pharmacy Records, Multiple Informants  Selexipag TABS 1,600 mcg [id number]   Darlin Drop, DO  Active   sildenafil (REVATIO) 20 MG tablet [id number] Yes Take 1 tablet (20 mg total) by mouth 3 (three) times daily. Laurey Morale, MD Past Week Active Other, Pharmacy Records, Multiple Informants  sildenafil (REVATIO) tablet 20 mg [id number]   Lurene Shadow, MD  Active   spironolactone (ALDACTONE) 25 MG tablet [id number] Yes Take 1 tablet (25 mg total) by mouth daily. She has been taking a whole pill Laurey Morale, MD Past Week Active Other, Pharmacy Records, Multiple Informants  spironolactone (ALDACTONE) tablet 25 mg [id number]  Lurene Shadow, MD  Active   torsemide (DEMADEX) 20 MG tablet [id number] Yes Take 3 tablets (60 mg total) by mouth 2 (two) times daily. Patient takes 3 tablets by mouth twice a day.  Patient taking differently: Take 40 mg by mouth 2 (two) times daily. Been taking 40mg  twice a day   [place], Anderson Malta, FNP Past Week Active Self, Other, Pharmacy Records, Multiple Informants  torsemide [facility]) tablet 40 mg [id number]   Lurene Shadow, MD  Active             Home Care and Equipment/Supplies: Were Home Health Services Ordered?: Yes Name of Home Health Agency:: [facility] Has Agency set up a time to come to your home?: No EMR reviewed for Home Health Orders: Orders present/patient has not received call (refer to CM for follow-up)  (Daughter given number) Any new equipment or medical supplies ordered?: No  Functional Questionnaire: Do you need assistance with bathing/showering or dressing?: No Do you need assistance with meal preparation?: No Do you need assistance with eating?: No Do you have difficulty maintaining continence: No Do you need assistance with getting out of bed/getting out of a chair/moving?: No Do you have difficulty managing or taking your medications?: No  Follow up appointments reviewed: PCP Follow-up appointment confirmed?: Yes Date of PCP follow-up appointment?: 11/04/22 Follow-up Provider: [facility] Follow-up appointment confirmed?: No Reason Specialist Follow-Up Not Confirmed: Patient has Specialist Provider Number and will Call for Appointment Do you need transportation to your follow-up appointment?: No Do you understand care options if your condition(s) worsen?: Yes-patient verbalized understanding Interventions Today    Flowsheet Row Most Recent Value  General Interventions   General Interventions Discussed/Reviewed General Interventions Discussed, General Interventions Reviewed, Doctor Visits  Doctor Visits Discussed/Reviewed Doctor Visits Discussed, Doctor Visits Reviewed  Nutrition Interventions   Nutrition Discussed/Reviewed Nutrition Discussed, Nutrition Reviewed, Decreasing salt  [bland diet ubntil stomach feels better]      TOC Interventions Today    Flowsheet Row Most Recent Value  TOC Interventions   TOC Interventions Discussed/Reviewed TOC Interventions Discussed, TOC Interventions Reviewed, Arranged PCP follow up within 7 days/Care Guide scheduled         Gean Maidens BSN RN [facility] [phone]

## 2022-10-28 NOTE — Telephone Encounter (Signed)
Spoke to Patient's daughter [name] she states her Mother is not interested in going back to the hospital right now when the weekend provider came in and discharged her after the first provider had stated that she was going to be hospitalized. The discharge provider gave the Patient medications and discharged her. Patient's daughter would like some advice due to her Mother still being in pain. The home health nurse states a GI cocktail needs to be sent in but Dr. Lorin Picket said the Patient needs to be evaluated again. [name] stated they have an appointment with you on 11/04/22. Please advise

## 2022-10-28 NOTE — Telephone Encounter (Signed)
Reviewed discharge summary.  Pt presented with abdominal pain.  GI saw her.  Had EGD and colonoscopy.  CT also performed.  Still unclear what was causing her pain.  Comment regarding chest pain while in hospital.  If persistent pain, she needs to be evaluated and then can determine best treatment.

## 2022-10-28 NOTE — Telephone Encounter (Signed)
Patient's daughter states the provider at the hospital said they was going to keep her Mother but when they switched shifts for the weekend the other provider came in and discharged her without going forward with the tests the first provider said they would do. The home health nurse would like to see if you can call in a GI cocktail for the Patient due to her still being in pain. Patient has discontinued the Lidocaine 2% solution.

## 2022-10-29 ENCOUNTER — Encounter: Payer: Self-pay | Admitting: Gastroenterology

## 2022-10-29 DIAGNOSIS — I130 Hypertensive heart and chronic kidney disease with heart failure and stage 1 through stage 4 chronic kidney disease, or unspecified chronic kidney disease: Secondary | ICD-10-CM | POA: Diagnosis not present

## 2022-10-29 DIAGNOSIS — N1832 Chronic kidney disease, stage 3b: Secondary | ICD-10-CM | POA: Diagnosis not present

## 2022-10-29 DIAGNOSIS — E1122 Type 2 diabetes mellitus with diabetic chronic kidney disease: Secondary | ICD-10-CM | POA: Diagnosis not present

## 2022-10-29 DIAGNOSIS — M549 Dorsalgia, unspecified: Secondary | ICD-10-CM | POA: Diagnosis not present

## 2022-10-29 DIAGNOSIS — I4821 Permanent atrial fibrillation: Secondary | ICD-10-CM | POA: Diagnosis not present

## 2022-10-29 DIAGNOSIS — D631 Anemia in chronic kidney disease: Secondary | ICD-10-CM | POA: Diagnosis not present

## 2022-10-29 DIAGNOSIS — I5042 Chronic combined systolic (congestive) and diastolic (congestive) heart failure: Secondary | ICD-10-CM | POA: Diagnosis not present

## 2022-10-29 DIAGNOSIS — I2721 Secondary pulmonary arterial hypertension: Secondary | ICD-10-CM | POA: Diagnosis not present

## 2022-10-29 DIAGNOSIS — I2510 Atherosclerotic heart disease of native coronary artery without angina pectoris: Secondary | ICD-10-CM | POA: Diagnosis not present

## 2022-10-29 NOTE — Addendum Note (Signed)
Addended by: Glori Luis on: 10/29/2022 10:58 AM   Modules accepted: Orders

## 2022-10-29 NOTE — Telephone Encounter (Addendum)
Noted. Is she still having stomach pain? Has the patient been taking the protonix twice daily? The GI physician recommended having her see general surgery and GYN if her abdominal pain is not improving. I referred her to GYN last week. Has she heard from them yet? I can refer her to general surgery to get their input if she is ok with that. A GI cocktail would include viscous lidocaine and they previously noted an issue with the patient taking the viscous lidocaine so I do not think a GI cocktail would be an option. We could have her try carafate to see if that would help.

## 2022-10-30 DIAGNOSIS — D631 Anemia in chronic kidney disease: Secondary | ICD-10-CM | POA: Diagnosis not present

## 2022-10-30 DIAGNOSIS — N1832 Chronic kidney disease, stage 3b: Secondary | ICD-10-CM | POA: Diagnosis not present

## 2022-10-30 DIAGNOSIS — I2510 Atherosclerotic heart disease of native coronary artery without angina pectoris: Secondary | ICD-10-CM | POA: Diagnosis not present

## 2022-10-30 DIAGNOSIS — E1122 Type 2 diabetes mellitus with diabetic chronic kidney disease: Secondary | ICD-10-CM | POA: Diagnosis not present

## 2022-10-30 DIAGNOSIS — I4821 Permanent atrial fibrillation: Secondary | ICD-10-CM | POA: Diagnosis not present

## 2022-10-30 DIAGNOSIS — I130 Hypertensive heart and chronic kidney disease with heart failure and stage 1 through stage 4 chronic kidney disease, or unspecified chronic kidney disease: Secondary | ICD-10-CM | POA: Diagnosis not present

## 2022-10-30 DIAGNOSIS — I2721 Secondary pulmonary arterial hypertension: Secondary | ICD-10-CM | POA: Diagnosis not present

## 2022-10-30 DIAGNOSIS — I5042 Chronic combined systolic (congestive) and diastolic (congestive) heart failure: Secondary | ICD-10-CM | POA: Diagnosis not present

## 2022-10-30 DIAGNOSIS — M549 Dorsalgia, unspecified: Secondary | ICD-10-CM | POA: Diagnosis not present

## 2022-10-31 ENCOUNTER — Telehealth: Payer: Self-pay | Admitting: Family Medicine

## 2022-10-31 NOTE — Telephone Encounter (Signed)
Katie from [facility] called stating she saw saw the pt on yesterday and she did an eval but because of her abdominal pain pt did not want do therapy uintil she heals. Pt has a home aid to help with pt back and they need an order for 2x a week for three weeks starting next week. Florentina Addison stated there is a Engineer, civil (consulting) following as well

## 2022-11-04 ENCOUNTER — Encounter: Payer: Self-pay | Admitting: Obstetrics and Gynecology

## 2022-11-04 ENCOUNTER — Ambulatory Visit: Payer: Medicare HMO | Admitting: Obstetrics and Gynecology

## 2022-11-04 ENCOUNTER — Encounter: Payer: Self-pay | Admitting: Family Medicine

## 2022-11-04 ENCOUNTER — Ambulatory Visit (INDEPENDENT_AMBULATORY_CARE_PROVIDER_SITE_OTHER): Payer: Medicare HMO | Admitting: Family Medicine

## 2022-11-04 VITALS — BP 174/72 | HR 60 | Ht 60.0 in | Wt 201.4 lb

## 2022-11-04 VITALS — BP 132/82 | HR 61 | Temp 98.4°F | Ht 60.0 in | Wt 201.6 lb

## 2022-11-04 DIAGNOSIS — D631 Anemia in chronic kidney disease: Secondary | ICD-10-CM

## 2022-11-04 DIAGNOSIS — I5032 Chronic diastolic (congestive) heart failure: Secondary | ICD-10-CM | POA: Diagnosis not present

## 2022-11-04 DIAGNOSIS — N83209 Unspecified ovarian cyst, unspecified side: Secondary | ICD-10-CM

## 2022-11-04 DIAGNOSIS — K921 Melena: Secondary | ICD-10-CM | POA: Diagnosis not present

## 2022-11-04 DIAGNOSIS — R1013 Epigastric pain: Secondary | ICD-10-CM | POA: Diagnosis not present

## 2022-11-04 DIAGNOSIS — N1832 Chronic kidney disease, stage 3b: Secondary | ICD-10-CM

## 2022-11-04 DIAGNOSIS — N949 Unspecified condition associated with female genital organs and menstrual cycle: Secondary | ICD-10-CM

## 2022-11-04 LAB — BASIC METABOLIC PANEL
BUN: 15 mg/dL (ref 6–23)
CO2: 28 mEq/L (ref 19–32)
Calcium: 8.8 mg/dL (ref 8.4–10.5)
Chloride: 107 mEq/L (ref 96–112)
Creatinine, Ser: 0.96 mg/dL (ref 0.40–1.20)
GFR: 56.34 mL/min — ABNORMAL LOW (ref >60.00)
Glucose, Bld: 95 mg/dL (ref 70–99)
Potassium: 4.3 mEq/L (ref 3.5–5.1)
Sodium: 142 mEq/L (ref 135–145)

## 2022-11-04 LAB — CBC
HCT: 30 % — ABNORMAL LOW (ref 36.0–46.0)
Hemoglobin: 9.4 g/dL — ABNORMAL LOW (ref 12.0–15.0)
MCHC: 31.4 g/dL (ref 30.0–36.0)
MCV: 88.6 fl (ref 78.0–100.0)
Platelets: 180 10*3/uL (ref 150.0–400.0)
RBC: 3.38 Mil/uL — ABNORMAL LOW (ref 3.87–5.11)
RDW: 13.4 % (ref 11.5–15.5)
WBC: 4.7 10*3/uL (ref 4.0–10.5)

## 2022-11-04 MED ORDER — TORSEMIDE 20 MG PO TABS
20.0000 mg | ORAL_TABLET | Freq: Every day | ORAL | Status: DC
Start: 2022-11-04 — End: 2022-12-29

## 2022-11-04 NOTE — Progress Notes (Signed)
Patient presents today to discuss ovarian cysts. She states having stomach pain which led her to the ED where she underwent multiple tests. CT showing L and R cysts and possible hydrosalpinx. Reports her stomach is still causing her pain.

## 2022-11-04 NOTE — Assessment & Plan Note (Signed)
Ongoing issues with this.  It is certainly possible that she is having intussusception though would be a little odd for that the cause consistent abdominal pain.  Discussed her ovarian cyst could also be contributing to her abdominal pain.  Will refer to general surgery and have her see GYN later today as planned.

## 2022-11-04 NOTE — Progress Notes (Signed)
[name], MD Phone: [phone]  Debra Saunders is a 77 y.o. female who presents today for follow-up.  Abdominal pain: Patient was evaluated in the hospital after her last visit for her abdominal pain.  She underwent extensive GI workup with colonoscopy, upper endoscopy, and capsule endoscopy that did not reveal a cause for her symptoms.  GI felt it was not GI related and recommended that she see general surgery and gynecology if her symptoms persisted.  Patient was already referred to gynecology given her ovarian cysts seen on CT imaging.  She has an appointment with GYN this afternoon.  She was found to be dehydrated in the hospital.  Her chronic anemia was slightly worse at discharge. Patient continues to have some abdominal pain.  Notes her whole abdomen hurts all the time that worsens significantly when she eats something.  Her CT imaging revealed possible short segment intussusception.  CHF: Patient was taken off of her torsemide in the hospital.  She notes she woke up short of breath last night and feels like she is retaining fluid in her chest.  Social History   Tobacco Use  Smoking Status Former   Packs/day: 0.30   Years: 2.00   Additional pack years: 0.00   Total pack years: 0.60   Types: Cigarettes   Quit date: 06/23/1990   Years since quitting: 32.3  Smokeless Tobacco Never    Current Outpatient Medications on File Prior to Visit  Medication Sig Dispense Refill   acetaminophen (TYLENOL) 325 MG tablet Take 325-650 mg by mouth every 6 (six) hours as needed for mild pain.     albuterol (ACCUNEB) 0.63 MG/3ML nebulizer solution Take 1 ampule by nebulization every 6 (six) hours as needed for wheezing.     apixaban (ELIQUIS) 5 MG TABS tablet Take 1 tablet (5 mg total) by mouth 2 (two) times daily. 180 tablet 3   atropine 1 % ophthalmic solution Place 1 drop into both eyes at bedtime.     cefadroxil (DURICEF) 500 MG capsule Take 1 capsule (500 mg total) by mouth 2 (two)  times daily. 180 capsule 3   DULoxetine (CYMBALTA) 60 MG capsule Take 60 mg by mouth daily.     gabapentin (NEURONTIN) 300 MG capsule Take 1 capsule (300 mg total) by mouth 2 (two) times daily. 60 capsule 0   lidocaine (LIDODERM) 5 % Place 2 patches onto the skin daily. Remove & Discard patch within 12 hours or as directed by MD 30 patch 0   macitentan (OPSUMIT) 10 MG tablet Take 1 tablet (10 mg total) by mouth daily. 90 tablet 3   meclizine (ANTIVERT) 25 MG tablet Take 1 tablet (25 mg total) by mouth 3 (three) times daily as needed for dizziness. 30 tablet 1   Menthol, Topical Analgesic, (BIOFREEZE EX) Apply 1 application topically 4 (four) times daily as needed (for pain).     midodrine (PROAMATINE) 5 MG tablet Take one tablet at 12 noon, 4pm and 8pm 270 tablet 3   oxyCODONE (OXY IR/ROXICODONE) 5 MG immediate release tablet Take 1 tablet (5 mg total) by mouth every 4 (four) hours as needed for moderate pain, breakthrough pain or severe pain. 12 tablet 0   pantoprazole (PROTONIX) 40 MG tablet Take 1 tablet (40 mg total) by mouth 2 (two) times daily. 30 tablet 0   polyethylene glycol (MIRALAX / GLYCOLAX) 17 g packet Take 17 g by mouth daily. 14 each 0   rosuvastatin (CRESTOR) 10 MG tablet Take 1 tablet (10 mg  total) by mouth daily. 90 tablet 3   Selexipag (UPTRAVI) 1600 MCG TABS Take 1 tablet (1,600 mcg total) by mouth 2 (two) times daily. 60 tablet 11   sildenafil (REVATIO) 20 MG tablet Take 1 tablet (20 mg total) by mouth 3 (three) times daily. 90 tablet 3   No current facility-administered medications on file prior to visit.     ROS see history of present illness  Objective  Physical Exam Vitals:   11/04/22 1136 11/04/22 1146  BP: (!) 144/82 132/82  Pulse: 61   Temp: 98.4 F (36.9 C)   SpO2: 98%     BP Readings from Last 3 Encounters:  11/04/22 132/82  10/26/22 104/64  10/17/22 116/68   Wt Readings from Last 3 Encounters:  11/04/22 201 lb 9.6 oz (91.4 kg)  10/26/22 196 lb  10.4 oz (89.2 kg)  10/17/22 196 lb 9.6 oz (89.2 kg)    Physical Exam Constitutional:      General: She is not in acute distress.    Appearance: She is not diaphoretic.  Cardiovascular:     Rate and Rhythm: Normal rate and regular rhythm.     Heart sounds: Normal heart sounds.  Pulmonary:     Effort: Pulmonary effort is normal.     Breath sounds: Wheezing (Rare faint scattered expiratory wheezes) present.  Abdominal:     General: Bowel sounds are normal. There is no distension.     Palpations: Abdomen is soft.     Tenderness: There is abdominal tenderness (Mildly tender throughout). There is no guarding.  Skin:    General: Skin is warm and dry.  Neurological:     Mental Status: She is alert.      Assessment/Plan: Please see individual problem list.  Epigastric pain Assessment & Plan: Ongoing issues with this.  It is certainly possible that she is having intussusception though would be a little odd for that the cause consistent abdominal pain.  Discussed her ovarian cyst could also be contributing to her abdominal pain.  Will refer to general surgery and have her see GYN later today as planned.  Orders: -     Ambulatory referral to General Surgery  Anemia due to stage 3b chronic kidney disease (HCC) -     CBC  Stage 3b chronic kidney disease (CKD) (HCC) Assessment & Plan: Chronic issue.  Possibly with some AKI recently.  Check kidney function today.  Orders: -     Basic metabolic panel  Black stool Assessment & Plan: Recent GI workup with no cause for this.   Chronic diastolic CHF (congestive heart failure) (HCC) Assessment & Plan: Chronic issue.  Possibly with some volume overload with her symptoms and weight gain.  Advised to restart torsemide 20 mg daily.  We will check renal function today.  If she remains on torsemide daily moving forward will recheck renal function in 1 to 2 weeks.  Orders: -     Torsemide; Take 1 tablet (20 mg total) by mouth  daily.     Return in about 2 weeks (around 11/18/2022) for Recheck breathing and abdominal pain.   [name], MD [facility]

## 2022-11-04 NOTE — Progress Notes (Signed)
HPI:      [name] is a 77 y.o. G4P0 who LMP was No LMP recorded. Patient has had a hysterectomy.  Subjective:   She presents today with recent onset of abdominal pain.  She also reports that she has intermittent diarrhea.  She suffers for pulmonary hypertension with chronic respiratory failure.  Last night she had difficulty breathing and had to sit up in the chair because she believes she is fluid overloaded.  She has contacted her PCP and he has advised her to take a "water pill". A recent workup for her abdominal pain shows possible bowel intussusception. In addition she has 2 small ovarian cysts 1 of which has slightly increased in size.  There is also the possibility of a hydrosalpinx.    Hx: The following portions of the patient's history were reviewed and updated as appropriate:             She  has a past medical history of Arthritis, Atrial fibrillation, persistent (HCC), CAD (coronary artery disease), Cardiorenal syndrome, Chronic back pain, Chronic combined systolic (congestive) and diastolic (congestive) heart failure (HCC), Chronic respiratory failure (HCC), Gastritis, History of COVID-19, History of endocarditis (08/22/2021), History of intracranial hemorrhage, Hyperlipidemia, Hypertension, Infection due to implanted cardiac device (HCC) (07/25/2021), Interstitial lung disease (HCC), Lipoma of back, Lymphedema, NICM (nonischemic cardiomyopathy) (HCC), Obesity, PAH (pulmonary artery hypertension) (HCC), 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 She does not have any pertinent problems on file. She  has a past surgical history that includes Cholecystectomy; Cardiac catheterization  (2013); Abdominal hysterectomy (1969); Cyst removal trunk (2002); lipoma removal (2014); Pacemaker insertion (2015); Insert / replace / remove pacemaker; permanent pacemaker insertion (N/A, 01/09/2014); Trigger finger release; TEE without cardioversion (N/A, 08/20/2017); DIALYSIS/PERMA CATHETER INSERTION (N/A, 10/06/2017); CARDIOVERSION (N/A, 10/12/2017); RIGHT/LEFT HEART CATH AND CORONARY ANGIOGRAPHY (N/A, 10/19/2017); RIGHT HEART CATH (N/A, 04/19/2018); Colonoscopy with propofol (N/A, 09/10/2018); Breast biopsy (Right, 10/28/2019); RIGHT HEART CATH (N/A, 08/30/2020); Cataract extraction w/PHACO (Right, 09/19/2020); Cataract extraction w/PHACO (Left, 10/03/2020); TEE without cardioversion (N/A, 06/28/2021); TEE without cardioversion (N/A, 07/04/2021); Cardioversion (N/A, 07/04/2021); RIGHT HEART CATH (N/A, 02/11/2022); Esophagogastroduodenoscopy (egd) with propofol (N/A, 10/21/2022); Colonoscopy with propofol (N/A, 10/22/2022); and Givens capsule study (N/A, 10/22/2022). Her family history includes Bone cancer in her sister; Breast cancer in her maternal aunt and sister; COPD in her mother; Heart disease in her mother; Hypertension in her father, mother, sister, and sister; Stroke in her son; Thyroid disease in her mother, sister, and sister. She  reports that she quit smoking about 32 years ago. Her smoking use included cigarettes. She has a 0.60 pack-year smoking history. She has never used smokeless tobacco. She reports that she does not drink alcohol and does not use drugs. She has a current medication list which includes the following prescription(s): acetaminophen, albuterol, apixaban, atropine, cefadroxil, lidocaine, macitentan, meclizine, menthol (topical analgesic), midodrine, oxycodone, pantoprazole, polyethylene glycol, rosuvastatin, uptravi, sildenafil, torsemide, duloxetine, and gabapentin. She is allergic to aspirin and other.       Review of Systems:  Review of Systems  Constitutional: Denied constitutional  symptoms, night sweats, recent illness, fatigue, fever, insomnia and weight loss.  Eyes: Denied eye symptoms, eye pain, photophobia, vision change and visual disturbance.  Ears/Nose/Throat/Neck: Denied ear, nose, throat or neck symptoms, hearing loss, nasal discharge, sinus congestion and sore throat.  Cardiovascular: Denied cardiovascular symptoms, arrhythmia, chest pain/pressure, edema, exercise intolerance, orthopnea and palpitations.  Respiratory: Denied pulmonary symptoms, asthma, pleuritic pain, productive sputum, cough, dyspnea and wheezing.  Gastrointestinal: Denied, gastro-esophageal reflux, melena, nausea and vomiting.  Genitourinary: Denied genitourinary symptoms including symptomatic vaginal discharge, pelvic relaxation issues, and urinary complaints.  Musculoskeletal: Denied musculoskeletal symptoms, stiffness, swelling, muscle weakness and myalgia.  Dermatologic: Denied dermatology symptoms, rash and scar.  Neurologic: Denied neurology symptoms, dizziness, headache, neck pain and syncope.  Psychiatric: Denied psychiatric symptoms, anxiety and depression.  Endocrine: Denied endocrine symptoms including hot flashes and night sweats.   Meds:   Current Outpatient Medications on File Prior to Visit  Medication Sig Dispense Refill   acetaminophen (TYLENOL) 325 MG tablet Take 325-650 mg by mouth every 6 (six) hours as needed for mild pain.     albuterol (ACCUNEB) 0.63 MG/3ML nebulizer solution Take 1 ampule by nebulization every 6 (six) hours as needed for wheezing.     apixaban (ELIQUIS) 5 MG TABS tablet Take 1 tablet (5 mg total) by mouth 2 (two) times daily. 180 tablet 3   atropine 1 % ophthalmic solution Place 1 drop into both eyes at bedtime.     cefadroxil (DURICEF) 500 MG capsule Take 1 capsule (500 mg total) by mouth 2 (two) times daily. 180 capsule 3   lidocaine (LIDODERM) 5 % Place 2 patches onto the skin daily. Remove & Discard patch within 12 hours or as directed by MD 30  patch 0   macitentan (OPSUMIT) 10 MG tablet Take 1 tablet (10 mg total) by mouth daily. 90 tablet 3   meclizine (ANTIVERT) 25 MG tablet Take 1 tablet (25 mg total) by mouth 3 (three) times daily as needed for dizziness. 30 tablet 1   Menthol, Topical Analgesic, (BIOFREEZE EX) Apply 1 application topically 4 (four) times daily as needed (for pain).     midodrine (PROAMATINE) 5 MG tablet Take one tablet at 12 noon, 4pm and 8pm 270 tablet 3   oxyCODONE (OXY IR/ROXICODONE) 5 MG immediate release tablet Take 1 tablet (5 mg total) by mouth every 4 (four) hours as needed for moderate pain, breakthrough pain or severe pain. 12 tablet 0   pantoprazole (PROTONIX) 40 MG tablet Take 1 tablet (40 mg total) by mouth 2 (two) times daily. 30 tablet 0   polyethylene glycol (MIRALAX / GLYCOLAX) 17 g packet Take 17 g by mouth daily. 14 each 0   rosuvastatin (CRESTOR) 10 MG tablet Take 1 tablet (10 mg total) by mouth daily. 90 tablet 3   Selexipag (UPTRAVI) 1600 MCG TABS Take 1 tablet (1,600 mcg total) by mouth 2 (two) times daily. 60 tablet 11   sildenafil (REVATIO) 20 MG tablet Take 1 tablet (20 mg total) by mouth 3 (three) times daily. 90 tablet 3   DULoxetine (CYMBALTA) 60 MG capsule Take 60 mg by mouth daily. (Patient not taking: Reported on 11/04/2022)     gabapentin (NEURONTIN) 300 MG capsule Take 1 capsule (300 mg total) by mouth 2 (two) times daily. (Patient not taking: Reported on 11/04/2022) 60 capsule 0   No current facility-administered medications on file prior to visit.      Objective:     Vitals:   11/04/22 1442 11/04/22 1456  BP: (!) 174/74 (!) 174/72  Pulse: 60    Filed Weights   11/04/22 1442  Weight: 201 lb 6.4 oz (91.4 kg)              CT results reviewed  Assessment:    G4P0 Patient Active Problem List   Diagnosis Date Noted   Permanent atrial fibrillation (HCC) 10/20/2022   Genital warts 10/17/2022   Stage 3b chronic kidney disease (CKD) (HCC) 08/23/2021   Anemia  due to chronic kidney disease 08/23/2021   Orthostatic hypotension 08/22/2021   Abdominal pain 07/06/2021   Black stool 06/25/2021   HLD (hyperlipidemia)    Chronic diastolic CHF (congestive heart failure) (HCC)    DVT (deep venous thrombosis) (HCC)    Thrombocytopenia (HCC)    Obesity (BMI 30-39.9) 04/30/2020   Elevated alkaline phosphatase level 04/30/2020   Chronic diarrhea 11/04/2019   Bilateral ovarian cysts 08/08/2019   B12 deficiency 08/02/2019   Iron deficiency 08/01/2019   Atrial fibrillation with tachy brady syndrome  09/22/2017   Pain in limb 07/22/2016   Neuropathy 06/09/2016   Chronic deep vein thrombosis (DVT) (HCC) 03/03/2016   Chronic back pain 10/19/2015   Goiter 04/25/2015   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 Adnexal cyst     Likely benign based on appearance.  Slight increase in size.  Doubt this as a source of her abdominal pain.  Hydrosalpinx also likely not the source of her abdominal pain.  Likely bowel related.  Intussusception?  Patient also has chronic diarrhea.   Plan:            1.  Patient has a follow-up scheduled with surgery for possible surgery.  Would be happy to present during surgery to further evaluate the ovarian cyst or the hydrosalpinx.  Possible management at that time.   Patient with multiple medical problems that need to be addressed on a regular basis. Advised her to keep her surgery appointment and to be evaluated for her bowel issues which I believe are the likely source of her abdominal pain.  Orders No orders of the defined types were placed in this encounter.   No orders of the defined types  were placed in this encounter.     F/U  No follow-ups on file. I spent 25 minutes involved in the care of this patient preparing to see the patient by obtaining and reviewing her medical history (including labs, imaging tests and prior procedures), documenting clinical information in the electronic health record (EHR), counseling and coordinating care plans, writing and sending prescriptions, ordering tests or procedures and in direct communicating with the patient and medical staff discussing pertinent items from her history and physical exam.  Elonda Husky, M.D. 11/04/2022 3:22 PM

## 2022-11-04 NOTE — Assessment & Plan Note (Addendum)
Chronic issue.  Possibly with some volume overload with her symptoms and weight gain.  Advised to restart torsemide 20 mg daily.  We will check renal function today.  If she remains on torsemide daily moving forward will recheck renal function in 1 to 2 weeks.

## 2022-11-04 NOTE — Assessment & Plan Note (Signed)
Recent GI workup with no cause for this.

## 2022-11-04 NOTE — Assessment & Plan Note (Signed)
Chronic issue.  Possibly with some AKI recently.  Check kidney function today.

## 2022-11-07 DIAGNOSIS — D631 Anemia in chronic kidney disease: Secondary | ICD-10-CM | POA: Diagnosis not present

## 2022-11-07 DIAGNOSIS — E1122 Type 2 diabetes mellitus with diabetic chronic kidney disease: Secondary | ICD-10-CM | POA: Diagnosis not present

## 2022-11-07 DIAGNOSIS — I4821 Permanent atrial fibrillation: Secondary | ICD-10-CM | POA: Diagnosis not present

## 2022-11-07 DIAGNOSIS — I2510 Atherosclerotic heart disease of native coronary artery without angina pectoris: Secondary | ICD-10-CM | POA: Diagnosis not present

## 2022-11-07 DIAGNOSIS — M549 Dorsalgia, unspecified: Secondary | ICD-10-CM | POA: Diagnosis not present

## 2022-11-07 DIAGNOSIS — I2721 Secondary pulmonary arterial hypertension: Secondary | ICD-10-CM | POA: Diagnosis not present

## 2022-11-07 DIAGNOSIS — I130 Hypertensive heart and chronic kidney disease with heart failure and stage 1 through stage 4 chronic kidney disease, or unspecified chronic kidney disease: Secondary | ICD-10-CM | POA: Diagnosis not present

## 2022-11-07 DIAGNOSIS — I5042 Chronic combined systolic (congestive) and diastolic (congestive) heart failure: Secondary | ICD-10-CM | POA: Diagnosis not present

## 2022-11-07 DIAGNOSIS — N1832 Chronic kidney disease, stage 3b: Secondary | ICD-10-CM | POA: Diagnosis not present

## 2022-11-11 DIAGNOSIS — J9611 Chronic respiratory failure with hypoxia: Secondary | ICD-10-CM

## 2022-11-11 DIAGNOSIS — K2970 Gastritis, unspecified, without bleeding: Secondary | ICD-10-CM

## 2022-11-11 DIAGNOSIS — D631 Anemia in chronic kidney disease: Secondary | ICD-10-CM | POA: Diagnosis not present

## 2022-11-11 DIAGNOSIS — Z87891 Personal history of nicotine dependence: Secondary | ICD-10-CM

## 2022-11-11 DIAGNOSIS — I493 Ventricular premature depolarization: Secondary | ICD-10-CM

## 2022-11-11 DIAGNOSIS — Z8673 Personal history of transient ischemic attack (TIA), and cerebral infarction without residual deficits: Secondary | ICD-10-CM

## 2022-11-11 DIAGNOSIS — N1832 Chronic kidney disease, stage 3b: Secondary | ICD-10-CM | POA: Diagnosis not present

## 2022-11-11 DIAGNOSIS — Z7901 Long term (current) use of anticoagulants: Secondary | ICD-10-CM

## 2022-11-11 DIAGNOSIS — M549 Dorsalgia, unspecified: Secondary | ICD-10-CM | POA: Diagnosis not present

## 2022-11-11 DIAGNOSIS — E876 Hypokalemia: Secondary | ICD-10-CM

## 2022-11-11 DIAGNOSIS — B955 Unspecified streptococcus as the cause of diseases classified elsewhere: Secondary | ICD-10-CM

## 2022-11-11 DIAGNOSIS — E669 Obesity, unspecified: Secondary | ICD-10-CM

## 2022-11-11 DIAGNOSIS — I071 Rheumatic tricuspid insufficiency: Secondary | ICD-10-CM

## 2022-11-11 DIAGNOSIS — T827XXA Infection and inflammatory reaction due to other cardiac and vascular devices, implants and grafts, initial encounter: Secondary | ICD-10-CM

## 2022-11-11 DIAGNOSIS — I82503 Chronic embolism and thrombosis of unspecified deep veins of lower extremity, bilateral: Secondary | ICD-10-CM

## 2022-11-11 DIAGNOSIS — F419 Anxiety disorder, unspecified: Secondary | ICD-10-CM

## 2022-11-11 DIAGNOSIS — I428 Other cardiomyopathies: Secondary | ICD-10-CM

## 2022-11-11 DIAGNOSIS — I890 Lymphedema, not elsewhere classified: Secondary | ICD-10-CM

## 2022-11-11 DIAGNOSIS — E041 Nontoxic single thyroid nodule: Secondary | ICD-10-CM

## 2022-11-11 DIAGNOSIS — I4821 Permanent atrial fibrillation: Secondary | ICD-10-CM | POA: Diagnosis not present

## 2022-11-11 DIAGNOSIS — Z9181 History of falling: Secondary | ICD-10-CM

## 2022-11-11 DIAGNOSIS — I130 Hypertensive heart and chronic kidney disease with heart failure and stage 1 through stage 4 chronic kidney disease, or unspecified chronic kidney disease: Secondary | ICD-10-CM | POA: Diagnosis not present

## 2022-11-11 DIAGNOSIS — Z950 Presence of cardiac pacemaker: Secondary | ICD-10-CM

## 2022-11-11 DIAGNOSIS — I2510 Atherosclerotic heart disease of native coronary artery without angina pectoris: Secondary | ICD-10-CM | POA: Diagnosis not present

## 2022-11-11 DIAGNOSIS — E1122 Type 2 diabetes mellitus with diabetic chronic kidney disease: Secondary | ICD-10-CM | POA: Diagnosis not present

## 2022-11-11 DIAGNOSIS — K5730 Diverticulosis of large intestine without perforation or abscess without bleeding: Secondary | ICD-10-CM

## 2022-11-11 DIAGNOSIS — J849 Interstitial pulmonary disease, unspecified: Secondary | ICD-10-CM

## 2022-11-11 DIAGNOSIS — I9589 Other hypotension: Secondary | ICD-10-CM

## 2022-11-11 DIAGNOSIS — N179 Acute kidney failure, unspecified: Secondary | ICD-10-CM

## 2022-11-11 DIAGNOSIS — I2721 Secondary pulmonary arterial hypertension: Secondary | ICD-10-CM | POA: Diagnosis not present

## 2022-11-11 DIAGNOSIS — E785 Hyperlipidemia, unspecified: Secondary | ICD-10-CM

## 2022-11-11 DIAGNOSIS — Z9981 Dependence on supplemental oxygen: Secondary | ICD-10-CM

## 2022-11-11 DIAGNOSIS — K219 Gastro-esophageal reflux disease without esophagitis: Secondary | ICD-10-CM

## 2022-11-11 DIAGNOSIS — M1990 Unspecified osteoarthritis, unspecified site: Secondary | ICD-10-CM

## 2022-11-11 DIAGNOSIS — Z792 Long term (current) use of antibiotics: Secondary | ICD-10-CM

## 2022-11-11 DIAGNOSIS — G8929 Other chronic pain: Secondary | ICD-10-CM

## 2022-11-11 DIAGNOSIS — I5042 Chronic combined systolic (congestive) and diastolic (congestive) heart failure: Secondary | ICD-10-CM | POA: Diagnosis not present

## 2022-11-11 DIAGNOSIS — G4733 Obstructive sleep apnea (adult) (pediatric): Secondary | ICD-10-CM

## 2022-11-11 DIAGNOSIS — Z6838 Body mass index (BMI) 38.0-38.9, adult: Secondary | ICD-10-CM

## 2022-11-11 DIAGNOSIS — F32A Depression, unspecified: Secondary | ICD-10-CM

## 2022-11-13 DIAGNOSIS — E1122 Type 2 diabetes mellitus with diabetic chronic kidney disease: Secondary | ICD-10-CM | POA: Diagnosis not present

## 2022-11-13 DIAGNOSIS — I4821 Permanent atrial fibrillation: Secondary | ICD-10-CM | POA: Diagnosis not present

## 2022-11-13 DIAGNOSIS — N1832 Chronic kidney disease, stage 3b: Secondary | ICD-10-CM | POA: Diagnosis not present

## 2022-11-13 DIAGNOSIS — I2510 Atherosclerotic heart disease of native coronary artery without angina pectoris: Secondary | ICD-10-CM | POA: Diagnosis not present

## 2022-11-13 DIAGNOSIS — D631 Anemia in chronic kidney disease: Secondary | ICD-10-CM | POA: Diagnosis not present

## 2022-11-13 DIAGNOSIS — M549 Dorsalgia, unspecified: Secondary | ICD-10-CM | POA: Diagnosis not present

## 2022-11-13 DIAGNOSIS — I5042 Chronic combined systolic (congestive) and diastolic (congestive) heart failure: Secondary | ICD-10-CM | POA: Diagnosis not present

## 2022-11-13 DIAGNOSIS — I130 Hypertensive heart and chronic kidney disease with heart failure and stage 1 through stage 4 chronic kidney disease, or unspecified chronic kidney disease: Secondary | ICD-10-CM | POA: Diagnosis not present

## 2022-11-13 DIAGNOSIS — I2721 Secondary pulmonary arterial hypertension: Secondary | ICD-10-CM | POA: Diagnosis not present

## 2022-11-14 DIAGNOSIS — I2510 Atherosclerotic heart disease of native coronary artery without angina pectoris: Secondary | ICD-10-CM | POA: Diagnosis not present

## 2022-11-14 DIAGNOSIS — I5042 Chronic combined systolic (congestive) and diastolic (congestive) heart failure: Secondary | ICD-10-CM | POA: Diagnosis not present

## 2022-11-14 DIAGNOSIS — I4821 Permanent atrial fibrillation: Secondary | ICD-10-CM | POA: Diagnosis not present

## 2022-11-14 DIAGNOSIS — M549 Dorsalgia, unspecified: Secondary | ICD-10-CM | POA: Diagnosis not present

## 2022-11-14 DIAGNOSIS — E1122 Type 2 diabetes mellitus with diabetic chronic kidney disease: Secondary | ICD-10-CM | POA: Diagnosis not present

## 2022-11-14 DIAGNOSIS — D631 Anemia in chronic kidney disease: Secondary | ICD-10-CM | POA: Diagnosis not present

## 2022-11-14 DIAGNOSIS — N1832 Chronic kidney disease, stage 3b: Secondary | ICD-10-CM | POA: Diagnosis not present

## 2022-11-14 DIAGNOSIS — I2721 Secondary pulmonary arterial hypertension: Secondary | ICD-10-CM | POA: Diagnosis not present

## 2022-11-14 DIAGNOSIS — I130 Hypertensive heart and chronic kidney disease with heart failure and stage 1 through stage 4 chronic kidney disease, or unspecified chronic kidney disease: Secondary | ICD-10-CM | POA: Diagnosis not present

## 2022-11-18 ENCOUNTER — Inpatient Hospital Stay (HOSPITAL_BASED_OUTPATIENT_CLINIC_OR_DEPARTMENT_OTHER): Payer: Medicare HMO | Admitting: Internal Medicine

## 2022-11-18 ENCOUNTER — Ambulatory Visit (INDEPENDENT_AMBULATORY_CARE_PROVIDER_SITE_OTHER): Payer: Medicare HMO | Admitting: Surgery

## 2022-11-18 ENCOUNTER — Inpatient Hospital Stay: Payer: Medicare HMO | Attending: Internal Medicine

## 2022-11-18 ENCOUNTER — Inpatient Hospital Stay: Payer: Medicare HMO

## 2022-11-18 ENCOUNTER — Encounter: Payer: Self-pay | Admitting: Surgery

## 2022-11-18 ENCOUNTER — Encounter: Payer: Self-pay | Admitting: Internal Medicine

## 2022-11-18 ENCOUNTER — Ambulatory Visit: Payer: Medicare HMO | Admitting: Surgery

## 2022-11-18 VITALS — BP 168/120 | HR 62

## 2022-11-18 VITALS — BP 156/73 | HR 58 | Temp 96.5°F | Ht 60.0 in | Wt 206.7 lb

## 2022-11-18 VITALS — BP 178/98 | HR 89 | Temp 98.0°F | Ht 60.0 in | Wt 206.0 lb

## 2022-11-18 DIAGNOSIS — Z803 Family history of malignant neoplasm of breast: Secondary | ICD-10-CM | POA: Diagnosis not present

## 2022-11-18 DIAGNOSIS — M940 Chondrocostal junction syndrome [Tietze]: Secondary | ICD-10-CM | POA: Diagnosis not present

## 2022-11-18 DIAGNOSIS — D649 Anemia, unspecified: Secondary | ICD-10-CM | POA: Diagnosis not present

## 2022-11-18 DIAGNOSIS — J449 Chronic obstructive pulmonary disease, unspecified: Secondary | ICD-10-CM | POA: Insufficient documentation

## 2022-11-18 DIAGNOSIS — Z9981 Dependence on supplemental oxygen: Secondary | ICD-10-CM | POA: Insufficient documentation

## 2022-11-18 DIAGNOSIS — Z87891 Personal history of nicotine dependence: Secondary | ICD-10-CM | POA: Insufficient documentation

## 2022-11-18 DIAGNOSIS — R5383 Other fatigue: Secondary | ICD-10-CM | POA: Insufficient documentation

## 2022-11-18 DIAGNOSIS — E611 Iron deficiency: Secondary | ICD-10-CM

## 2022-11-18 DIAGNOSIS — E538 Deficiency of other specified B group vitamins: Secondary | ICD-10-CM | POA: Diagnosis not present

## 2022-11-18 DIAGNOSIS — Z8673 Personal history of transient ischemic attack (TIA), and cerebral infarction without residual deficits: Secondary | ICD-10-CM | POA: Insufficient documentation

## 2022-11-18 DIAGNOSIS — I4891 Unspecified atrial fibrillation: Secondary | ICD-10-CM | POA: Insufficient documentation

## 2022-11-18 DIAGNOSIS — R0602 Shortness of breath: Secondary | ICD-10-CM | POA: Insufficient documentation

## 2022-11-18 DIAGNOSIS — Z950 Presence of cardiac pacemaker: Secondary | ICD-10-CM | POA: Diagnosis not present

## 2022-11-18 DIAGNOSIS — Z79899 Other long term (current) drug therapy: Secondary | ICD-10-CM | POA: Diagnosis not present

## 2022-11-18 DIAGNOSIS — R197 Diarrhea, unspecified: Secondary | ICD-10-CM | POA: Diagnosis not present

## 2022-11-18 DIAGNOSIS — N183 Chronic kidney disease, stage 3 unspecified: Secondary | ICD-10-CM | POA: Insufficient documentation

## 2022-11-18 DIAGNOSIS — Z7901 Long term (current) use of anticoagulants: Secondary | ICD-10-CM | POA: Diagnosis not present

## 2022-11-18 DIAGNOSIS — Z86718 Personal history of other venous thrombosis and embolism: Secondary | ICD-10-CM | POA: Insufficient documentation

## 2022-11-18 DIAGNOSIS — R222 Localized swelling, mass and lump, trunk: Secondary | ICD-10-CM | POA: Diagnosis not present

## 2022-11-18 DIAGNOSIS — R0789 Other chest pain: Secondary | ICD-10-CM | POA: Diagnosis not present

## 2022-11-18 DIAGNOSIS — E876 Hypokalemia: Secondary | ICD-10-CM | POA: Insufficient documentation

## 2022-11-18 DIAGNOSIS — D631 Anemia in chronic kidney disease: Secondary | ICD-10-CM | POA: Insufficient documentation

## 2022-11-18 LAB — CMP (CANCER CENTER ONLY)
ALT: 15 U/L (ref 0–44)
AST: 18 U/L (ref 15–41)
Albumin: 3.6 g/dL (ref 3.5–5.0)
Alkaline Phosphatase: 97 U/L (ref 38–126)
Anion gap: 10 (ref 5–15)
BUN: 17 mg/dL (ref 8–23)
CO2: 28 mmol/L (ref 22–32)
Calcium: 8.5 mg/dL — ABNORMAL LOW (ref 8.9–10.3)
Chloride: 104 mmol/L (ref 98–111)
Creatinine: 0.85 mg/dL (ref 0.44–1.00)
GFR, Estimated: >60 mL/min (ref >60)
Glucose, Bld: 114 mg/dL — ABNORMAL HIGH (ref 70–99)
Potassium: 2.9 mmol/L — ABNORMAL LOW (ref 3.5–5.1)
Sodium: 142 mmol/L (ref 135–145)
Total Bilirubin: 0.4 mg/dL (ref 0.3–1.2)
Total Protein: 6.8 g/dL (ref 6.5–8.1)

## 2022-11-18 LAB — CBC WITH DIFFERENTIAL (CANCER CENTER ONLY)
Abs Immature Granulocytes: 0.01 10*3/uL (ref 0.00–0.07)
Basophils Absolute: 0 10*3/uL (ref 0.0–0.1)
Basophils Relative: 1 %
Eosinophils Absolute: 0.2 10*3/uL (ref 0.0–0.5)
Eosinophils Relative: 5 %
HCT: 29.6 % — ABNORMAL LOW (ref 36.0–46.0)
Hemoglobin: 8.8 g/dL — ABNORMAL LOW (ref 12.0–15.0)
Immature Granulocytes: 0 %
Lymphocytes Relative: 29 %
Lymphs Abs: 1.2 10*3/uL (ref 0.7–4.0)
MCH: 27.6 pg (ref 26.0–34.0)
MCHC: 29.7 g/dL — ABNORMAL LOW (ref 30.0–36.0)
MCV: 92.8 fL (ref 80.0–100.0)
Monocytes Absolute: 0.3 10*3/uL (ref 0.1–1.0)
Monocytes Relative: 7 %
Neutro Abs: 2.4 10*3/uL (ref 1.7–7.7)
Neutrophils Relative %: 58 %
Platelet Count: 169 10*3/uL (ref 150–400)
RBC: 3.19 MIL/uL — ABNORMAL LOW (ref 3.87–5.11)
RDW: 12.3 % (ref 11.5–15.5)
WBC Count: 4.1 10*3/uL (ref 4.0–10.5)
nRBC: 0 % (ref 0.0–0.2)

## 2022-11-18 LAB — FERRITIN: Ferritin: 582 ng/mL — ABNORMAL HIGH (ref 11–307)

## 2022-11-18 LAB — IRON AND TIBC
Iron: 71 ug/dL (ref 28–170)
Saturation Ratios: 22 % (ref 10.4–31.8)
TIBC: 330 ug/dL (ref 250–450)
UIBC: 259 ug/dL

## 2022-11-18 LAB — VITAMIN B12: Vitamin B-12: 169 pg/mL — ABNORMAL LOW (ref 180–914)

## 2022-11-18 MED ORDER — SODIUM CHLORIDE 0.9 % IV SOLN
Freq: Once | INTRAVENOUS | Status: AC
  Filled 2022-11-18: qty 250

## 2022-11-18 MED ORDER — POTASSIUM CHLORIDE CRYS ER 20 MEQ PO TBCR: EXTENDED_RELEASE_TABLET | ORAL | 3 refills | Status: DC

## 2022-11-18 MED ORDER — SODIUM CHLORIDE 0.9 % IV SOLN
200.0000 mg | Freq: Once | INTRAVENOUS | Status: AC
  Administered 2022-11-18: 200 mg via INTRAVENOUS
  Filled 2022-11-18: qty 200

## 2022-11-18 MED ORDER — CYANOCOBALAMIN 1000 MCG/ML IJ SOLN
1000.0000 ug | Freq: Once | INTRAMUSCULAR | Status: AC
  Administered 2022-11-18: 1000 ug via INTRAMUSCULAR
  Filled 2022-11-18: qty 1

## 2022-11-18 NOTE — Progress Notes (Signed)
Pt has been educated and understands. Pt declined to stay 30 mins after iron infusion. BP elevated and noted to take her BP meds and discuss with her primary. Patient had to go to another appt. At 1530.

## 2022-11-18 NOTE — Progress Notes (Signed)
Patient ID: Debra Saunders, female   DOB: [birth year], 77 y.o.   MRN: [id number]  Chief Complaint: Pain with swallowing, epigastric pain  History of Present Illness Debra Saunders is a 77 y.o. female with the above, even drinking water produces pain during the swallowing process, and remaining epigastric after eating or drinking.  She had a CT scan which showed an area of presumed short segment intussusception of the duodenal/pyloric region, without evidence of any obstruction or persistence during EGD.  EGD was completed as noted below.  I do not believe any telescoping of small bowel proximally into the pylorus would be an issue causing the patient's pain.  She has had gastroesophageal reflux disease pain in the past, and this is different.  Therefore apparently there has been no entertainment of trialing a PPI once again.  She has had a cholecystectomy, and has normal LFTs.  She has some subcentimeter cysts on her pancreas, but normal lipase.  She has had no recent jaundice or hepatitis.  Colonoscopy of 5/1 noted to have tubular adenoma polypectomies, and diverticula and mild hemorrhoids.   EGD (10/21/2022) Findings: The duodenal bulb, first portion of the duodenum and second portion of the duodenum were normal. Estimated blood loss: none.  Small bowel appears to be telescoping through pylorus, which may be etiology of patient' s discomfort. no signs of ulcer or tumor when visualized. Biopsies were taken with a cold forceps for histology. Biopsies were taken with a cold forceps for Helicobacter pylori testing. Estimated blood loss was minimal.  The exam of the stomach was otherwise normal.  The Z- line was regular. Estimated blood loss: none.  Esophagogastric landmarks were identified: the gastroesophageal junction was found at 36 cm from the incisors.  The examined esophagus was normal. Past Medical History Past Medical History:  Diagnosis Date   Arthritis    Atrial fibrillation,  persistent (HCC)    a. s/p TEE-guided DCCV on 08/20/2017; b. 06/2021 Recurrent Afib-->amio/TEE/DCCV.   CAD (coronary artery disease)    a. 01/2012 Cath: nonobs dzs; b. 04/2014 MV: No ischemia; c. 09/2017 Cath: Minimal nonobs RCA dzs, otw nl cors.   Cardiorenal syndrome    a. 09/2017 Creat rose to 2.7 w/ diuresis-->HD x 2.   Chronic back pain    Chronic combined systolic (congestive) and diastolic (congestive) heart failure (HCC)    a. Dx 2005 @ [facility];  b. 02/2014 Echo: EF 55-60%; c. 07/2017: EF 30-35%; d. 09/2017 Echo: EF 40-45%; e. 02/2018 Echo: EF 55-60%; f. 08/2019 Echo: EF 60-65%; g. 08/2020 RHC: RA 3, RV 30/1, PA 33/11 (20), PCWP 6; f. 08/2021 Echo: EF 60-65%, no rwma, GrIDD, nl RV fxn, RVSP 40.74mmHg, mod dil LA, mod TR.   Chronic respiratory failure (HCC)    a. on home O2   Gastritis    History of COVID-19    History of endocarditis 08/22/2021   History of intracranial hemorrhage    a. 6/14 described by neurosurgery as "small frontal posttraumatic SAH " - pt was on xarelto @ the time.   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 Chronic LLE edema.   NICM (nonischemic cardiomyopathy) (HCC)    a. 09/2017 Echo: EF 40-45%; b. 09/2017 Cath: minor, insignificant CAD; c. 02/2018 Echo: EF 55-60%, Gr1 DD; d. 08/2019 Echo: EF 60-65%, Gr2 DD.   Obesity  PAH (pulmonary artery hypertension) (HCC)    a. 09/2017 Echo: PASP ; b. 09/2017 RHC: RA 23, RV 84/13, PA 84/29, PCWP 20, CO 3.89, CI 1.89. LVEDP 18; c. 08/2020 RHC: RA 3, RV 30/1, PA 33/11 (20), PCWP 6. CO/CI 6.18/3.36. PVR 2.3 WU.   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 06/2021. Veg on PPM lead. Not candidate for extraction-->s/p 6 wks abx.   Sepsis with metabolic encephalopathy (HCC)    Sleep apnea    Streptococcal bacteremia    Stroke [facility]) 2014   Subarachnoid  hemorrhage (HCC) 01/19/2013   Symptomatic bradycardia    a. s/p BSX PPM.   Thyroid nodule    Type II diabetes mellitus (HCC)    Urinary incontinence       Past Surgical History:  Procedure Laterality Date   ABDOMINAL HYSTERECTOMY  1969   BREAST BIOPSY Right 10/28/2019   lymph node bx, [place] Bx, pending path    CARDIAC CATHETERIZATION  2013   @ [facility]: No obstructive CAD: Only 20% ostial left circumflex.   CARDIOVERSION N/A 10/12/2017   Procedure: CARDIOVERSION;  Surgeon: Antonieta Iba, MD;  Location: [facility] ORS;  Service: Cardiovascular;  Laterality: N/A;   CARDIOVERSION N/A 07/04/2021   Procedure: CARDIOVERSION;  Surgeon: Laurey Morale, MD;  Location: [facility] ENDOSCOPY;  Service: Cardiovascular;  Laterality: N/A;   CATARACT EXTRACTION W/PHACO Right 09/19/2020   Procedure: CATARACT EXTRACTION PHACO AND INTRAOCULAR LENS PLACEMENT (IOC) RIGHT DIABETIC 3.95 00:43.0 9.2%;  Surgeon: Lockie Mola, MD;  Location: [facility] SURGERY CNTR;  Service: Ophthalmology;  Laterality: Right;  Pt requests to be last sleep apnea   CATARACT EXTRACTION W/PHACO Left 10/03/2020   Procedure: CATARACT EXTRACTION PHACO AND INTRAOCULAR LENS PLACEMENT (IOC) LEFT DIABETIC;  Surgeon: Lockie Mola, MD;  Location: [facility] SURGERY CNTR;  Service: Ophthalmology;  Laterality: Left;  3.25 0:59.3 5.5%   CHOLECYSTECTOMY     COLONOSCOPY WITH PROPOFOL N/A 09/10/2018   Procedure: COLONOSCOPY WITH PROPOFOL;  Surgeon: Scot Jun, MD;  Location: [facility] ENDOSCOPY;  Service: Endoscopy;  Laterality: N/A;   COLONOSCOPY WITH PROPOFOL N/A 10/22/2022   Procedure: COLONOSCOPY WITH PROPOFOL;  Surgeon: Jaynie Collins, DO;  Location: [facility] ENDOSCOPY;  Service: Gastroenterology;  Laterality: N/A;   CYST REMOVAL TRUNK  2002   BACK   DIALYSIS/PERMA CATHETER INSERTION N/A 10/06/2017   Procedure: DIALYSIS/PERMA CATHETER INSERTION;  Surgeon: Renford Dills, MD;  Location: [facility] INVASIVE CV LAB;  Service: Cardiovascular;   Laterality: N/A;   ESOPHAGOGASTRODUODENOSCOPY (EGD) WITH PROPOFOL N/A 10/21/2022   Procedure: ESOPHAGOGASTRODUODENOSCOPY (EGD) WITH PROPOFOL;  Surgeon: Jaynie Collins, DO;  Location: [facility] ENDOSCOPY;  Service: Gastroenterology;  Laterality: N/A;   GIVENS CAPSULE STUDY N/A 10/22/2022   Procedure: GIVENS CAPSULE STUDY;  Surgeon: Jaynie Collins, DO;  Location: [facility] ENDOSCOPY;  Service: Gastroenterology;  Laterality: N/A;  IF NOTHING IS FOUND WITH COLONOSCOPY   INSERT / REPLACE / REMOVE PACEMAKER     lipoma removal  2014   back   PACEMAKER INSERTION  2015   [employer] dual chamber pacemaker implanted by Dr Graciela Husbands for symptomatic bradycardia   PERMANENT PACEMAKER INSERTION N/A 01/09/2014   Procedure: PERMANENT PACEMAKER INSERTION;  Surgeon: [facility] Salvia, MD;  Location: [facility] CATH LAB;  Service: Cardiovascular;  Laterality: N/A;   RIGHT HEART CATH N/A 04/19/2018   Procedure: RIGHT HEART CATH;  Surgeon: Laurey Morale, MD;  Location: [facility] INVASIVE CV LAB;  Service: Cardiovascular;  Laterality: N/A;   RIGHT  HEART CATH N/A 08/30/2020   Procedure: RIGHT HEART CATH;  Surgeon: Laurey Morale, MD;  Location: [facility] INVASIVE CV LAB;  Service: Cardiovascular;  Laterality: N/A;   RIGHT HEART CATH N/A 02/11/2022   Procedure: RIGHT HEART CATH;  Surgeon: Laurey Morale, MD;  Location: [facility] INVASIVE CV LAB;  Service: Cardiovascular;  Laterality: N/A;   RIGHT/LEFT HEART CATH AND CORONARY ANGIOGRAPHY N/A 10/19/2017   Procedure: RIGHT/LEFT HEART CATH AND CORONARY ANGIOGRAPHY;  Surgeon: Iran Ouch, MD;  Location: [facility] INVASIVE CV LAB;  Service: Cardiovascular;  Laterality: N/A;   TEE WITHOUT CARDIOVERSION N/A 08/20/2017   Procedure: TRANSESOPHAGEAL ECHOCARDIOGRAM (TEE) & Direct current cardioversion;  Surgeon: Antonieta Iba, MD;  Location: [facility] ORS;  Service: Cardiovascular;  Laterality: N/A;   TEE WITHOUT CARDIOVERSION N/A 06/28/2021   Procedure: TRANSESOPHAGEAL ECHOCARDIOGRAM (TEE);  Surgeon:  Antonieta Iba, MD;  Location: [facility] ORS;  Service: Cardiovascular;  Laterality: N/A;   TEE WITHOUT CARDIOVERSION N/A 07/04/2021   Procedure: TRANSESOPHAGEAL ECHOCARDIOGRAM (TEE);  Surgeon: Laurey Morale, MD;  Location: [facility] ENDOSCOPY;  Service: Cardiovascular;  Laterality: N/A;   TRIGGER FINGER RELEASE      Allergies  Allergen Reactions   Aspirin Anaphylaxis and Hives    Pt states that she is unable to take because she had bleeding in her brain   Other Other (See Comments)    Pt states that she is unable to take blood thinners because she had bleeding in her brain; Blood Thinners-per doctor [facility] (Xarelto)    Current Outpatient Medications  Medication Sig Dispense Refill   acetaminophen (TYLENOL) 325 MG tablet Take 325-650 mg by mouth every 6 (six) hours as needed for mild pain.     albuterol (ACCUNEB) 0.63 MG/3ML nebulizer solution Take 1 ampule by nebulization every 6 (six) hours as needed for wheezing.     apixaban (ELIQUIS) 5 MG TABS tablet Take 1 tablet (5 mg total) by mouth 2 (two) times daily. 180 tablet 3   atropine 1 % ophthalmic solution Place 1 drop into both eyes at bedtime.     cefadroxil (DURICEF) 500 MG capsule Take 1 capsule (500 mg total) by mouth 2 (two) times daily. 180 capsule 3   lidocaine (LIDODERM) 5 % Place 2 patches onto the skin daily. Remove & Discard patch within 12 hours or as directed by MD 30 patch 0   macitentan (OPSUMIT) 10 MG tablet Take 1 tablet (10 mg total) by mouth daily. 90 tablet 3   meclizine (ANTIVERT) 25 MG tablet Take 1 tablet (25 mg total) by mouth 3 (three) times daily as needed for dizziness. 30 tablet 1   Menthol, Topical Analgesic, (BIOFREEZE EX) Apply 1 application topically 4 (four) times daily as needed (for pain).     midodrine (PROAMATINE) 5 MG tablet Take one tablet at 12 noon, 4pm and 8pm 270 tablet 3   polyethylene glycol (MIRALAX / GLYCOLAX) 17 g packet Take 17 g by mouth daily. 14 each 0   potassium chloride SA (KLOR-CON  M) 20 MEQ tablet 1 pill twice a day 60 tablet 3   rosuvastatin (CRESTOR) 10 MG tablet Take 1 tablet (10 mg total) by mouth daily. 90 tablet 3   Selexipag (UPTRAVI) 1600 MCG TABS Take 1 tablet (1,600 mcg total) by mouth 2 (two) times daily. 60 tablet 11   DULoxetine (CYMBALTA) 60 MG capsule Take 60 mg by mouth daily. (Patient not taking: Reported on 11/04/2022)     gabapentin (NEURONTIN) 300 MG capsule Take 1  capsule (300 mg total) by mouth 2 (two) times daily. (Patient not taking: Reported on 11/04/2022) 60 capsule 0   oxyCODONE (OXY IR/ROXICODONE) 5 MG immediate release tablet Take 1 tablet (5 mg total) by mouth every 4 (four) hours as needed for moderate pain, breakthrough pain or severe pain. (Patient not taking: Reported on 11/18/2022) 12 tablet 0   sildenafil (REVATIO) 20 MG tablet Take 1 tablet (20 mg total) by mouth 3 (three) times daily. (Patient not taking: Reported on 11/18/2022) 90 tablet 3   torsemide (DEMADEX) 20 MG tablet Take 1 tablet (20 mg total) by mouth daily. (Patient not taking: Reported on 11/18/2022)     No current facility-administered medications for this visit.    Family History Family History  Problem Relation Age of Onset   Heart disease Mother    Hypertension Mother    COPD Mother        was a smoker   Thyroid disease Mother    Hypertension Father    Hypertension Sister    Thyroid disease Sister    Breast cancer Sister    Hypertension Sister    Thyroid disease Sister    Bone cancer Sister    Stroke Son    Breast cancer Maternal Aunt    Kidney disease Neg Hx    Bladder Cancer Neg Hx       Social History Social History   Tobacco Use   Smoking status: Former    Packs/day: 0.30    Years: 2.00    Additional pack years: 0.00    Total pack years: 0.60    Types: Cigarettes    Quit date: 06/23/1990    Years since quitting: 32.4    Passive exposure: Past   Smokeless tobacco: Never  Vaping Use   Vaping Use: Never used  Substance Use Topics   Alcohol use:  No   Drug use: No        Review of Systems  Constitutional:  Positive for malaise/fatigue.  HENT: Negative.    Eyes:  Positive for blurred vision, double vision and pain.  Respiratory: Negative.    Cardiovascular:  Positive for palpitations, orthopnea and leg swelling.  Gastrointestinal:  Positive for abdominal pain, diarrhea and nausea. Negative for vomiting.  Genitourinary: Negative.   Skin: Negative.   Neurological: Negative.   Psychiatric/Behavioral: Negative.       Physical Exam Blood pressure (!) 178/98, pulse 89, temperature 98 F (36.7 C), height 5' (1.524 m), weight 206 lb (93.4 kg), SpO2 (!) 89 %. Last Weight  Most recent update: 11/18/2022  4:09 PM    Weight  93.4 kg (206 lb)             CONSTITUTIONAL: Well developed, and nourished, appropriately responsive and aware without distress.   EYES: Sclera non-icteric.   EARS, NOSE, MOUTH AND THROAT:  The oropharynx is clear. Oral mucosa is pink and moist.    Hearing is intact to voice.  NECK: Trachea is midline, and there is no jugular venous distension.  LYMPH NODES:  Lymph nodes in the neck are not appreciated. RESPIRATORY:   Normal respiratory effort without pathologic use of accessory muscles. CARDIOVASCULAR:  Well perfused.  GI: The abdomen is notable for bilateral para xiphoid costal tenderness, consistent with costochondritis, her abdomen is otherwise soft, nontender, and nondistended.  There is an infraumbilical midline scar with some dermal changes at the umbilicus.  There were no palpable masses.  Abdomen is obese.  I did not appreciate hepatosplenomegaly. MUSCULOSKELETAL:  Symmetrical muscle tone appreciated.   SKIN: Skin turgor is normal. No pathologic skin lesions appreciated.  NEUROLOGIC:  Motor and sensation appear grossly normal.  Cranial nerves are grossly without defect. PSYCH:  Alert and oriented to person, place and time. Affect is appropriate for situation.  Data Reviewed I have personally  reviewed what is currently available of the patient's imaging, recent labs and medical records.   Labs:     Latest Ref Rng & Units 11/18/2022    1:02 PM 11/04/2022   11:44 AM 10/26/2022    5:54 AM  CBC  WBC 4.0 - 10.5 K/uL 4.1  4.7  5.1   Hemoglobin 12.0 - 15.0 g/dL 8.8  9.4  8.6   Hematocrit 36.0 - 46.0 % 29.6  30.0  29.0   Platelets 150 - 400 K/uL 169  180.0  141       Latest Ref Rng & Units 11/18/2022    1:02 PM 11/04/2022   11:44 AM 10/26/2022    5:54 AM  CMP  Glucose 70 - 99 mg/dL 409  95  811   BUN 8 - 23 mg/dL 17  15  32   Creatinine 0.44 - 1.00 mg/dL 9.14  7.82  9.56   Sodium 135 - 145 mmol/L 142  142  134   Potassium 3.5 - 5.1 mmol/L 2.9  4.3  3.6   Chloride 98 - 111 mmol/L 104  107  102   CO2 22 - 32 mmol/L 28  28  24    Calcium 8.9 - 10.3 mg/dL 8.5  8.8  8.4   Total Protein 6.5 - 8.1 g/dL 6.8     Total Bilirubin 0.3 - 1.2 mg/dL 0.4     Alkaline Phos 38 - 126 U/L 97     AST 15 - 41 U/L 18     ALT 0 - 44 U/L 15         Imaging: Radiological images reviewed:  CLINICAL DATA:  Upper abdominal pain for 2 weeks, nausea, vomiting common diarrhea.   EXAM: CT ABDOMEN AND PELVIS WITH CONTRAST   TECHNIQUE: Multidetector CT imaging of the abdomen and pelvis was performed using the standard protocol following bolus administration of intravenous contrast.   RADIATION DOSE REDUCTION: This exam was performed according to the departmental dose-optimization program which includes automated exposure control, adjustment of the mA and/or kV according to patient size and/or use of iterative reconstruction technique.   CONTRAST:  OMNIPAQUE IOHEXOL 300 MG/ML  SOLN   COMPARISON:  CT abdomen/pelvis 06-2021   FINDINGS: Lower chest: Subpleural fibrotic change in the lung bases is similar to the prior study. Cardiac device leads are partially imaged. There is mild calcification of the aortic valve. The imaged heart is otherwise unremarkable.   Hepatobiliary: The liver is  unremarkable. The gallbladder is surgically absent, likely accounting for the mild biliary ductal prominence.   Pancreas: There are subcentimeter cystic lesions in the pancreatic body (2-33) and uncinate process (2-38), not definitely seen on prior studies.   Spleen: Unremarkable.   Adrenals/Urinary Tract: The adrenals are unremarkable.   The kidneys are unremarkable, with no focal lesion, stone, hydronephrosis, or hydroureter. There is symmetric excretion of contrast into the collecting systems on the delayed images.   Stomach/Bowel: There is possible short-segment intussusception or ulceration in the region of the pylorus (2-30, 4-59). The stomach and duodenum are otherwise unremarkable. There is no evidence of bowel obstruction. There is colonic diverticulosis without evidence of acute diverticulitis. The appendix is  not identified.   Vascular/Lymphatic: There is calcified plaque in the nonaneurysmal abdominal aorta. The major branch vessels are patent. The main portal and splenic veins are patent. An IVC filter is in place. Patency is not assessed due to bolus timing. There is no abdominopelvic lymphadenopathy.   Reproductive: The uterus is surgically absent. There is a 4.4 cm simple left adnexal cyst and a 2.2 cm right adnexal cyst. There is possible right hydrosalpinx. The cysts are increased in size since 2023.   Other: There is no ascites or free air.   Musculoskeletal: There is no acute osseous abnormality or suspicious osseous lesion.   IMPRESSION: 1. Possible short-segment intussusception or ulceration in the region of the pylorus. Recommend correlation with endoscopy. 2. Subcentimeter cystic lesions in the pancreatic body and uncinate process, not definitely seen on prior studies. Recommend follow-up MRI/MRCP with and without contrast in 2 years. 3. Colonic diverticulosis without evidence of acute diverticulitis. 4. Bilateral adnexal cysts, increased in size  since 2023. Recommend follow-up pelvic ultrasound in 6-12 months.     Electronically Signed   By: Lesia Hausen M.D.   On: 10/17/2022 13:24   Within last 24 hrs: No results found.  Assessment    Costochondritis, questionable esophageal spasm.   Patient Active Problem List   Diagnosis Date Noted   Permanent atrial fibrillation (HCC) 10/20/2022   Genital warts 10/17/2022   Stage 3b chronic kidney disease (CKD) (HCC) 08/23/2021   Anemia due to chronic kidney disease 08/23/2021   Orthostatic hypotension 08/22/2021   Abdominal pain 07/06/2021   Black stool 06/25/2021   HLD (hyperlipidemia)    Chronic diastolic CHF (congestive heart failure) (HCC)    DVT (deep venous thrombosis) (HCC)    Thrombocytopenia (HCC)    Obesity (BMI 30-39.9) 04/30/2020   Elevated alkaline phosphatase level 04/30/2020   Chronic diarrhea 11/04/2019   Bilateral ovarian cysts 08/08/2019   B12 deficiency 08/02/2019   Iron deficiency 08/01/2019   Atrial fibrillation with tachy brady syndrome  09/22/2017   Pain in limb 07/22/2016   Neuropathy 06/09/2016   Chronic deep vein thrombosis (DVT) (HCC) 03/03/2016   Chronic back pain 10/19/2015   Goiter 04/25/2015   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  I do not believe any perceived retrograde intussusception could be a source of her pain.  As it would not be associated with her remarkable costal tenderness.  I have to question a possibility of esophageal spasm or persistent reflux.  But none of this was  found on gastroenterology's evaluation. She has a degree of sensitivities that make trial of nonsteroidal antiinflammatories risky.  Perhaps an H2 blocker or PPI trial may be worthwhile to cover any intraluminal source of her pain.  Topical lidocaine patches have minimal benefit.  Other topical pain relievers i.e. ice or heat may be worthwhile. Suspect she may benefit from a pain specialist to help adjust her gabapentin which was recently discontinued, or possibly trial of Lyrica or some other associated pain control assist medication may be worthwhile.  There is clearly nothing from a  surgical perspective in terms of intervention that would be worthwhile in this 77 year old at high risk from general anesthesia.  Face-to-face time spent with the patient and accompanying care providers(if present) was 50 minutes, with more than 50% of the time spent counseling, educating, and coordinating care of the patient.    These notes generated with voice recognition software. I apologize for typographical errors.  Campbell Lerner M.D., FACS 11/18/2022, 4:20 PM

## 2022-11-18 NOTE — Addendum Note (Signed)
Addended by: Darrold Span A on: 11/18/2022 02:35 PM   Modules accepted: Orders

## 2022-11-18 NOTE — Assessment & Plan Note (Addendum)
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 CKD stage III  currently s/p Venofer.   # [DEC 2023- ferritin 382; iron sat 18%]; continue gentle iron 1 pill a day.    Proceed with venofer today. Also discussed the possible role of Retacrit with patient's hemoglobin does not improve.  S/p EGD/ capsule MAY 2024- NEG [hospital]; [place] [Dr.Russo]- MA 2024 - NEG.  # Etiology of anemia-unclear- CKD stage III  vs Others-[Dr.Lateef]/ [chronic diarrhea/malabsorption]-monitor closely.  If anemia is worse consider bone marrow biopsy.  # Mild Hypokalemia- K 2.8-  recommend re-start Kdur BID; refilled script   #History of DVT/s/p IVC filter; ?  A. Fib-Eliquis- stable.   #Borderline low B12-OCT 2023- 230; s/p B12 injection.; proceed with injection q 2- 4 months.    # DISPOSITION: # proceed  b12 today; and venofer today; no retacrit # Follow up in 1 months-APP-  labs-- cbc;bmp; b12 levels; iron studies; ferritin--possible Venofer OR retacrit;  B12 injection- Dr.B  Cc; Debra Saunders

## 2022-11-18 NOTE — Assessment & Plan Note (Deleted)
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 CKD stage III  currently s/p Venofer. JAN 2023 [sat 9%; ferrtin-700+- hospital/sepsis];   # [DEC 2023- ferritin 343; iron sat 18%]; continue gentle iron 1 pill a day.    Proceed with venofer today. Also discussed the possible role of Retacrit with patient's hemoglobin does not improve.   # Mild Hypokalemia- K 3.4- continue Kdur BID.   # Etiology of anemia-likely CKD stage III [Dr.Lateef]/ [chronic diarrhea/malabsorption]-monitor closely. Stable.   #History of DVT/s/p IVC filter; ?  A. Fib-Eliquis-stable.   #Borderline low B12-OCT 2023- 230; s/p B12 injection.; proceed with injection q 2- 4 months.    # Vaginal itching/discharge-rs/p Bx- negative.   # DISPOSITION: # proceed  b12 today; and venofer today; no retacrit # Follow up in 3 months-MD; labs-- cbc;bmp; b12 levels; iron studies; ferritin--possible Venofer OR retacrit;  B12 injection- Dr.B  Cc; Michelle JOhnson 

## 2022-11-18 NOTE — Progress Notes (Signed)
[facility] CONSULT NOTE  Patient Care Team: Debra Luis, MD as PCP - General (Family Medicine) Debra Ouch, MD as PCP - Cardiology (Cardiology) Debra Salvia, MD as PCP - Electrophysiology (Cardiology) Debra Rota, MD as Referring Physician (Allergy) Debra Leash, MD as Referring Physician (Internal Medicine) Debra Coder, MD as Consulting Physician (Hematology and Oncology) Anemia CHIEF COMPLAINTS/PURPOSE OF CONSULTATION: Anemia   HEMATOLOGY HISTORY  # CHORNIC IRON DEFICIENCY ANEMIA [BL 10-11]-Jan today 21 PCP Hb-8.8; Maralyn Sago sat-5%; ferritin 11] EGD-; colonoscopy MARCH 2020 -polyp [[facility]-hold further work-up secondary to cardiac issues]; remote-EGD-?;  February 2021-LDH/haptoglobin normal; kappa lambda light chain ratio/M panel normal;  on Venofer; November 2020-CT scan question early cirrhosis/no splenomegaly.  No acute process; MARCH 2021- STOOL OCCULT x3- NEGATIVE.   #B12 deficiency-IM every 3 to 4 months  #2014 -question blood transfusion  [patient admitted for stroke]; CHF chronic -[ 2004 on 4 lits; Dr.Arida/Klein]; CT scan chest 2021 -no adenopathy; fibrotic changes; walker; CKD stage III-   #Incidental right axillary mass-s/p US biopsy [PCP] -negative for malignancy.  HISTORY OF PRESENTING ILLNESS: Patient in wheelchair on [place] O2.  Alone.  Debra Saunders 77 y.o.  female multiple comorbidities including COPD/chronic typically failure on oxygen and anemia/likely secondary to ?CKD stage III -as needed IV iron is here for follow-up.   In the interim patient was admitted to hospital for abdominal pain.  Underwent EGD/capsule study unremarkable.  Patient's hemoglobin on 9.  Patient also recently underwent colonoscopy.  Patient has chronic fatigue not worse.  No blood in stools ; no nausea vomiting.  Continues to be compliant with her Eliquis.  Patient continues with oxygen.    Review of Systems  Constitutional:  Positive for  malaise/fatigue. Negative for chills, diaphoresis, fever and weight loss.  HENT:  Negative for nosebleeds and sore throat.   Eyes:  Negative for double vision.  Respiratory:  Positive for shortness of breath. Negative for cough, hemoptysis, sputum production and wheezing.   Cardiovascular:  Negative for chest pain, palpitations, orthopnea and leg swelling.  Gastrointestinal:  Negative for abdominal pain, blood in stool, constipation, heartburn, melena, nausea and vomiting.  Genitourinary:  Negative for dysuria, frequency and urgency.  Musculoskeletal:  Negative for back pain and joint pain.  Skin: Negative.  Negative for itching and rash.  Neurological:  Negative for dizziness, tingling, focal weakness, weakness and headaches.  Endo/Heme/Allergies:  Does not bruise/bleed easily.  Psychiatric/Behavioral:  Negative for depression. The patient is not nervous/anxious and does not have insomnia.     MEDICAL HISTORY:  Past Medical History:  Diagnosis Date   Arthritis    Atrial fibrillation, persistent (HCC)    a. s/p TEE-guided DCCV on 08/20/2017; b. 06/2021 Recurrent Afib-->amio/TEE/DCCV.   CAD (coronary artery disease)    a. 01/2012 Cath: nonobs dzs; b. 04/2014 MV: No ischemia; c. 09/2017 Cath: Minimal nonobs RCA dzs, otw nl cors.   Cardiorenal syndrome    a. 09/2017 Creat rose to 2.7 w/ diuresis-->HD x 2.   Chronic back pain    Chronic combined systolic (congestive) and diastolic (congestive) heart failure (HCC)    a. Dx 2005 @ [facility];  b. 02/2014 Echo: EF 55-60%; c. 07/2017: EF 30-35%; d. 09/2017 Echo: EF 40-45%; e. 02/2018 Echo: EF 55-60%; f. 08/2019 Echo: EF 60-65%; g. 08/2020 RHC: RA 3, RV 30/1, PA 33/11 (20), PCWP 6; f. 08/2021 Echo: EF 60-65%, no rwma, GrIDD, nl RV fxn, RVSP 40.52mmHg, mod dil LA, mod TR.   Chronic  respiratory failure (HCC)    a. on home O2   Gastritis    History of COVID-19    History of endocarditis 08/22/2021   History of intracranial hemorrhage    a. 6/14 described by  neurosurgery as "small frontal posttraumatic SAH " - pt was on xarelto @ the time.   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 Chronic LLE edema.   NICM (nonischemic cardiomyopathy) (HCC)    a. 09/2017 Echo: EF 40-45%; b. 09/2017 Cath: minor, insignificant CAD; c. 02/2018 Echo: EF 55-60%, Gr1 DD; d. 08/2019 Echo: EF 60-65%, Gr2 DD.   Obesity    PAH (pulmonary artery hypertension) (HCC)    a. 09/2017 Echo: PASP ; b. 09/2017 RHC: RA 23, RV 84/13, PA 84/29, PCWP 20, CO 3.89, CI 1.89. LVEDP 18; c. 08/2020 RHC: RA 3, RV 30/1, PA 33/11 (20), PCWP 6. CO/CI 6.18/3.36. PVR 2.3 WU.   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 06/2021. Veg on PPM lead. Not candidate for extraction-->s/p 6 wks abx.   Sepsis with metabolic encephalopathy (HCC)    Sleep apnea    Streptococcal bacteremia    Stroke [facility]) 2014   Subarachnoid hemorrhage (HCC) 01/19/2013   Symptomatic bradycardia    a. s/p BSX PPM.   Thyroid nodule    Type II diabetes mellitus (HCC)    Urinary incontinence     SURGICAL HISTORY: Past Surgical History:  Procedure Laterality Date   ABDOMINAL HYSTERECTOMY  1969   BREAST BIOPSY Right 10/28/2019   lymph node bx, [place] Bx, pending path    CARDIAC CATHETERIZATION  2013   @ [facility]: No obstructive CAD: Only 20% ostial left circumflex.   CARDIOVERSION N/A 10/12/2017   Procedure: CARDIOVERSION;  Surgeon: Antonieta Iba, MD;  Location: [facility] ORS;  Service: Cardiovascular;  Laterality: N/A;   CARDIOVERSION N/A 07/04/2021   Procedure: CARDIOVERSION;  Surgeon: Laurey Morale, MD;  Location: [facility] ENDOSCOPY;  Service: Cardiovascular;  Laterality: N/A;   CATARACT EXTRACTION W/PHACO Right 09/19/2020   Procedure: CATARACT EXTRACTION PHACO AND INTRAOCULAR LENS PLACEMENT (IOC) RIGHT DIABETIC 3.95 00:43.0 9.2%;  Surgeon: Lockie Mola, MD;  Location: [facility] SURGERY CNTR;  Service: Ophthalmology;  Laterality: Right;  Pt requests to be last sleep apnea   CATARACT EXTRACTION W/PHACO Left 10/03/2020   Procedure: CATARACT EXTRACTION PHACO AND INTRAOCULAR LENS PLACEMENT (IOC) LEFT DIABETIC;  Surgeon: Lockie Mola, MD;  Location: [facility]GERY CN[facility];  Service: Ophthalmology;  Laterality: Left;  3.25 0:59.3 5.5%   CHOLECYSTECTOMY     COLONOSCOPY WITH PROPOFOL N/A 09/10/2018   Procedure: COLONOSCOPY WITH PROPOFOL;  Surgeon: Scot Jun, MD;  Location: [facility] ENDOSCOPY;  Service: Endoscopy;  Laterality: N/A;   COLONOSCOPY WITH PROPOFOL N/A 10/22/2022   Procedure: COLONOSCOPY WITH PROPOFOL;  Surgeon: Jaynie Collins, DO;  Location: [facility] ENDOSCOPY;  Service: Gastroenterology;  Laterality: N/A;   CYST REMOVAL TRUNK  2002   BACK   DIALYSIS/PERMA CATHETER INSERTION N/A 10/06/2017   Procedure: DIALYSIS/PERMA CATHETER INSERTION;  Surgeon: Renford Dills, MD;  Location: [facility] INVASIVE CV LAB;  Service: Cardiovascular;  Laterality: N/A;   ESOPHAGOGASTRODUODENOSCOPY (EGD) WITH PROPOFOL N/A 10/21/2022   Procedure: ESOPHAGOGASTRODUODENOSCOPY (EGD) WITH PROPOFOL;  Surgeon: Jaynie Collins, DO;  Location: [facility] ENDOSCOPY;  Service: Gastroenterology;  Laterality: N/A;   GIVENS CAPSULE  STUDY N/A 10/22/2022   Procedure: GIVENS CAPSULE STUDY;  Surgeon: Jaynie Collins, DO;  Location: [facility] ENDOSCOPY;  Service: Gastroenterology;  Laterality: N/A;  IF NOTHING IS FOUND WITH COLONOSCOPY   INSERT / REPLACE / REMOVE PACEMAKER     lipoma removal  2014   back   PACEMAKER INSERTION  2015   [employer] dual chamber pacemaker implanted by Dr Graciela Husbands for symptomatic bradycardia   PERMANENT PACEMAKER INSERTION N/A 01/09/2014   Procedure: PERMANENT PACEMAKER INSERTION;  Surgeon: Debra Salvia, MD;  Location: [facility] CATH LAB;  Service: Cardiovascular;  Laterality: N/A;   RIGHT HEART CATH N/A 04/19/2018   Procedure: RIGHT HEART  CATH;  Surgeon: Laurey Morale, MD;  Location: [facility] INVASIVE CV LAB;  Service: Cardiovascular;  Laterality: N/A;   RIGHT HEART CATH N/A 08/30/2020   Procedure: RIGHT HEART CATH;  Surgeon: Laurey Morale, MD;  Location: [facility] INVASIVE CV LAB;  Service: Cardiovascular;  Laterality: N/A;   RIGHT HEART CATH N/A 02/11/2022   Procedure: RIGHT HEART CATH;  Surgeon: Laurey Morale, MD;  Location: [facility] INVASIVE CV LAB;  Service: Cardiovascular;  Laterality: N/A;   RIGHT/LEFT HEART CATH AND CORONARY ANGIOGRAPHY N/A 10/19/2017   Procedure: RIGHT/LEFT HEART CATH AND CORONARY ANGIOGRAPHY;  Surgeon: Debra Ouch, MD;  Location: [facility] INVASIVE CV LAB;  Service: Cardiovascular;  Laterality: N/A;   TEE WITHOUT CARDIOVERSION N/A 08/20/2017   Procedure: TRANSESOPHAGEAL ECHOCARDIOGRAM (TEE) & Direct current cardioversion;  Surgeon: Antonieta Iba, MD;  Location: [facility] ORS;  Service: Cardiovascular;  Laterality: N/A;   TEE WITHOUT CARDIOVERSION N/A 06/28/2021   Procedure: TRANSESOPHAGEAL ECHOCARDIOGRAM (TEE);  Surgeon: Antonieta Iba, MD;  Location: [facility] ORS;  Service: Cardiovascular;  Laterality: N/A;   TEE WITHOUT CARDIOVERSION N/A 07/04/2021   Procedure: TRANSESOPHAGEAL ECHOCARDIOGRAM (TEE);  Surgeon: Laurey Morale, MD;  Location: [facility] ENDOSCOPY;  Service: Cardiovascular;  Laterality: N/A;   TRIGGER FINGER RELEASE      SOCIAL HISTORY: Social History   Socioeconomic History   Marital status: Divorced    Spouse name: Not on file   Number of children: 4   Years of education: 12   Highest education level: High school graduate  Occupational History   Occupation: Retired- factory work    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 Has 4 children. No pets.      Work - Hotel manager, retired      Diet - regular      02/14/2019: reports has Meals On Wheels delivered to her home;       Remote hx of smoking > 30 years; No alcohol-    Social Determinants of Health   Financial Resource Strain: Low Risk  (02/19/2022)   Overall Financial Resource Strain (CARDIA)    Difficulty of Paying Living Expenses: Not hard at all  Food Insecurity: No Food Insecurity (10/20/2022)  Hunger Vital Sign    Worried About Running Out of Food in the Last Year: Never true    Ran Out of Food in the Last Year: Never true  Transportation Needs: Unmet Transportation Needs (11/18/2022)   PRAPARE - Transportation    Lack of Transportation (Medical): Yes    Lack of Transportation (Non-Medical): Yes  Physical Activity: Inactive (01/05/2018)   Exercise Vital Sign    Days of Exercise per Week: 0 days    Minutes of Exercise per Session: 0 min  Stress: No Stress Concern Present (02/19/2022)   [employer] of Occupational Health - Occupational Stress Questionnaire    Feeling of Stress : Not at all  Social Connections: Socially Isolated (02/19/2022)   Social Connection and Isolation Panel [NHANES]    Frequency of Communication with Friends and Family: More than three times a week    Frequency of Social Gatherings with Friends and Family: Once a week    Attends Religious Services: Never    Database administrator or Organizations: No    Attends Banker Meetings: Never    Marital Status: Divorced  Catering manager Violence: Not At Risk (10/20/2022)   Humiliation, Afraid, Rape, and Kick questionnaire    Fear of Current or Ex-Partner: No    Emotionally Abused: No    Physically Abused: No    Sexually Abused: No     FAMILY HISTORY: Family History  Problem Relation Age of Onset   Heart disease Mother    Hypertension Mother    COPD Mother        was a smoker   Thyroid disease Mother    Hypertension Father    Hypertension Sister    Thyroid disease Sister    Breast cancer Sister    Hypertension Sister    Thyroid disease Sister    Bone cancer Sister    Stroke Son    Breast cancer Maternal Aunt    Kidney disease Neg Hx    Bladder Cancer Neg Hx     ALLERGIES:  is allergic to aspirin and other.  MEDICATIONS:  Current Outpatient Medications  Medication Sig Dispense Refill   acetaminophen (TYLENOL) 325 MG tablet Take 325-650 mg by mouth every 6 (six) hours as needed for mild pain.     albuterol (ACCUNEB) 0.63 MG/3ML nebulizer solution Take 1 ampule by nebulization every 6 (six) hours as needed for wheezing.     apixaban (ELIQUIS) 5 MG TABS tablet Take 1 tablet (5 mg total) by mouth 2 (two) times daily. 180 tablet 3   atropine 1 % ophthalmic solution Place 1 drop into both eyes at bedtime.     cefadroxil (DURICEF) 500 MG capsule Take 1 capsule (500 mg total) by mouth 2 (two) times daily. 180 capsule 3   lidocaine (LIDODERM) 5 % Place 2 patches onto the skin daily. Remove & Discard patch within 12 hours or as directed by MD 30 patch 0   macitentan (OPSUMIT) 10 MG tablet Take 1 tablet (10 mg total) by mouth daily. 90 tablet 3   meclizine (ANTIVERT) 25 MG tablet Take 1 tablet (25 mg total) by mouth 3 (three) times daily as needed for dizziness. 30 tablet 1   Menthol, Topical Analgesic, (BIOFREEZE EX) Apply 1 application topically 4 (four) times daily as needed (for pain).     midodrine (PROAMATINE) 5 MG tablet Take one tablet at 12 noon, 4pm and 8pm 270 tablet 3   oxyCODONE (OXY IR/ROXICODONE) 5 MG immediate release tablet Take  1 tablet (5 mg total) by mouth every 4 (four) hours as needed for moderate pain, breakthrough pain or severe pain. 12 tablet 0   polyethylene glycol (MIRALAX / GLYCOLAX) 17 g  packet Take 17 g by mouth daily. 14 each 0   potassium chloride SA (KLOR-CON M) 20 MEQ tablet 1 pill twice a day 60 tablet 3   rosuvastatin (CRESTOR) 10 MG tablet Take 1 tablet (10 mg total) by mouth daily. 90 tablet 3   Selexipag (UPTRAVI) 1600 MCG TABS Take 1 tablet (1,600 mcg total) by mouth 2 (two) times daily. 60 tablet 11   DULoxetine (CYMBALTA) 60 MG capsule Take 60 mg by mouth daily. (Patient not taking: Reported on 11/04/2022)     gabapentin (NEURONTIN) 300 MG capsule Take 1 capsule (300 mg total) by mouth 2 (two) times daily. (Patient not taking: Reported on 11/04/2022) 60 capsule 0   sildenafil (REVATIO) 20 MG tablet Take 1 tablet (20 mg total) by mouth 3 (three) times daily. (Patient not taking: Reported on 11/18/2022) 90 tablet 3   torsemide (DEMADEX) 20 MG tablet Take 1 tablet (20 mg total) by mouth daily. (Patient not taking: Reported on 11/18/2022)     No current facility-administered medications for this visit.      PHYSICAL EXAMINATION:   Vitals:   11/18/22 1259  BP: (!) 156/73  Pulse: (!) 58  Temp: (!) 96.5 F (35.8 C)  SpO2: 96%   Filed Weights   11/18/22 1259  Weight: 206 lb 11.2 oz (93.8 kg)    Physical Exam Constitutional:      Comments: Patient in wheelchair.  Alone.  She is on O2 nasal cannula.  HENT:     Head: Normocephalic and atraumatic.     Mouth/Throat:     Pharynx: No oropharyngeal exudate.  Eyes:     Pupils: Pupils are equal, round, and reactive to light.  Cardiovascular:     Rate and Rhythm: Normal rate and regular rhythm.  Pulmonary:     Effort: No respiratory distress.     Breath sounds: No wheezing.     Comments: Decreased air entry bilaterally.  No wheeze or crackles. Abdominal:     General: Bowel sounds are normal. There is no distension.     Palpations: Abdomen is soft. There is no mass.     Tenderness: There is no abdominal tenderness. There is no guarding or rebound.  Musculoskeletal:        General: No tenderness. Normal range of  motion.     Cervical back: Normal range of motion and neck supple.  Skin:    General: Skin is warm.  Neurological:     Mental Status: She is alert and oriented to person, place, and time.  Psychiatric:        Mood and Affect: Affect normal.     LABORATORY DATA:  I have reviewed the data as listed Lab Results  Component Value Date   WBC 4.1 11/18/2022   HGB 8.8 (L) 11/18/2022   HCT 29.6 (L) 11/18/2022   MCV 92.8 11/18/2022   PLT 169 11/18/2022   Recent Labs    10/20/22 0423 10/21/22 0404 10/22/22 0143 10/25/22 0326 10/26/22 0554 11/04/22 1144 11/18/22 1302  NA 141 138   < > 134* 134* 142 142  K 2.9* 3.9   < > 3.6 3.6 4.3 2.9*  CL 101 101   < > 99 102 107 104  CO2 32 29   < > 28 24 28 28   GLUCOSE  107* 120*   < > 144* 129* 95 114*  BUN 23 19   < > 28* 32* 15 17  CREATININE 0.96 0.94   < > 1.32* 1.26* 0.96 0.85  CALCIUM 8.7* 9.0   < > 8.8* 8.4* 8.8 8.5*  GFRNONAA >60 >60   < > 41* 43*  --  >60  PROT 6.6 6.7  --   --   --   --  6.8  ALBUMIN 3.8 3.7  --   --   --   --  3.6  AST 14* 16  --   --   --   --  18  ALT 12 13  --   --   --   --  15  ALKPHOS 98 102  --   --   --   --  97  BILITOT 0.8 0.6  --   --   --   --  0.4  BILIDIR  --  0.1  --   --   --   --   --   IBILI  --  0.5  --   --   --   --   --    < > = values in this interval not displayed.     DG Chest 2 View  Result Date: 10/24/2022 CLINICAL DATA:  Chest pain EXAM: CHEST - 2 VIEW COMPARISON:  Previous studies including the chest radiograph done on 10/23/2022 FINDINGS: Transverse diameter of heart is slightly increased. Pacemaker battery is seen in left infraclavicular region. There is decreased volume with increased markings in right upper lung field with no significant change. There are no new focal infiltrates or signs of pulmonary edema. There is no significant pleural effusion or pneumothorax. IMPRESSION: Scarring in the right upper lung field has not changed. There are no new infiltrates or signs of  pulmonary edema. Electronically Signed   By: Ernie Avena M.D.   On: 10/24/2022 17:33   DG Chest Port 1 View  Result Date: 10/23/2022 CLINICAL DATA:  [id number] Pacemaker [id number] EXAM: PORTABLE CHEST 1 VIEW COMPARISON:  April 03, 2022 FINDINGS: The cardiomediastinal silhouette is unchanged in contour.LEFT chest dual lead cardiac pacing device. Atherosclerotic calcifications. Unchanged elevation of the RIGHT hemidiaphragm. No pleural effusion. No pneumothorax. Coarse reticular markings consistent with underlying interstitial lung disease. Is not significantly changed in comparison to prior. No acute pleuroparenchymal abnormality. IMPRESSION: LEFT chest dual lead cardiac pacing device without evidence of pneumothorax. Electronically Signed   By: Meda Klinefelter M.D.   On: 10/23/2022 13:06   US RENAL  Result Date: 10/23/2022 CLINICAL DATA:  [id number] AKI (acute kidney injury) (HCC) [id number] EXAM: RENAL / URINARY TRACT ULTRASOUND COMPLETE COMPARISON:  CT 10/17/2022 FINDINGS: Right Kidney: Renal measurements: 10.4 x 5.1 x 4.7 cm = volume: 130.1 mL. No hydronephrosis. Mildly increased renal cortical echogenicity. Left Kidney: Renal measurements: 9.7 x 5.3 x 4.1 cm = volume: 110.5 mL. No hydronephrosis. Mildly increased renal cortical echogenicity. Bladder: Appears normal for degree of bladder distention. Other: None. IMPRESSION: No hydronephrosis. Mildly increased renal cortical echogenicity bilaterally, as can be seen in medical renal disease. Electronically Signed   By: Caprice Renshaw M.D.   On: 10/23/2022 11:00     Symptomatic anemia # Chronic Anemia [BL10-11]-iron deficiency/? CKD stage III  currently s/p Venofer.   # [DEC 2023- ferritin 382; iron sat 18%]; continue gentle iron 1 pill a day.    Proceed with venofer today. Also discussed the possible role of Retacrit with patient's  hemoglobin does not improve.  S/p EGD/ capsule MAY 2024- NEG [hospital]; [place] [Dr.Russo]- [place] 2024 - NEG.  # Etiology of  anemia-unclear- CKD stage III  vs Others-[Dr.Lateef]/ [chronic diarrhea/malabsorption]-monitor closely.  If anemia is worse consider bone marrow biopsy.  # Mild Hypokalemia- K 2.8-  recommend re-start Kdur BID; refilled script   #History of DVT/s/p IVC filter; ?  A. Fib-Eliquis- stable.   #Borderline low B12-OCT 2023- 230; s/p B12 injection.; proceed with injection q 2- 4 months.    # DISPOSITION: # proceed  b12 today; and venofer today; no retacrit # Follow up in 1 months-APP-  labs-- cbc;bmp; b12 levels; iron studies; ferritin--possible Venofer OR retacrit;  B12 injection- Dr.B  Cc; Harmon Dun  All questions were answered. The patient knows to call the clinic with any problems, questions or concerns.    Debra Coder, MD 11/18/2022 2:26 PM

## 2022-11-18 NOTE — Patient Instructions (Addendum)
We will refer you to pain management to see if there is something they can do.   You may try taking Ibuprofen 200-400 mg two to three times a day for 1 week. You may try using ice to the area, 15 minutes on, 2 hours off for one week.   Costochondritis  Costochondritis is inflammation of the tissue (cartilage) that connects the ribs to the breastbone (sternum). This causes pain in the front of the chest. The pain often starts slowly and involves more than one rib. What are the causes? This condition results from stress on the cartilage where your ribs attach to your sternum. The exact cause of this stress is not always known. The cause may be: Chest injury. Exercise or activity. This may include lifting. Severe coughing. What increases the risk? You are more likely to develop this condition if: You are female. You are 36-22 years old. You just started a new exercise or work activity. You have low levels of vitamin D. You have a condition that makes you cough often. What are the signs or symptoms? The main symptom of this condition is chest pain. The pain: Starts slowly and can be sharp or dull. Gets worse with deep breathing, coughing, or exercise. Gets better with rest. May be worse when you press on the affected area of your ribs and sternum. How is this diagnosed? This condition is diagnosed based on your symptoms, your medical history, and a physical exam. Your health care provider will check for pain when pressing on your sternum. You may also have tests to rule out other causes of chest pain. These may include: A chest X-ray. This may be done to check for lung problems. An electrocardiogram (ECG). This may be done to see if you have a heart problem that could be causing the pain. An imaging scan. This may be done to rule out a broken bone (fracture) in your chest or ribcage. How is this treated? This condition may go away on its own over time. Your health care provider may  prescribe an NSAID, such as ibuprofen, to reduce pain and inflammation. Treatment may also include: Resting and avoiding activities that make pain worse. Putting heat or ice on the area to reduce pain and inflammation. Doing exercises to stretch your chest muscles. If these treatments do not help, your health care provider may inject a numbing medicine at the spot where the sternum and rib connect. This can help relieve the pain. Follow these instructions at home: Managing pain, stiffness, and swelling     If directed, put ice on the painful area. To do this: Put ice in a plastic bag. Place a towel between your skin and the bag. Leave the ice on for 20 minutes, 2-3 times a day. If directed, apply heat to the affected area as often as told by your health care provider. Use the heat source that your health care provider recommends, such as a moist heat pack or a heating pad. Place a towel between your skin and the heat source. Leave the heat on for 20-30 minutes. If your skin turns bright red, remove the heat or ice right away to prevent skin damage. The risk of skin damage is higher if you cannot feel pain, heat, or cold. Activity Rest as told by your health care provider. Avoid activities that make pain worse. This includes activities that use the muscles in your chest, abdomen, and sides. You may have to avoid lifting. Ask your health care provider  how much you can safely lift. Return to your normal activities as told by your health care provider. Ask your health care provider what activities are safe for you. General instructions Take over-the-counter and prescription medicines only as told by your health care provider. Contact a health care provider if: You have chills or a fever. Your pain does not go away, or it gets worse. You have a cough that does not go away. Get help right away if: You feel short of breath. You have severe chest pain that does not get better with medicines,  heat, or ice. These symptoms may be an emergency. Get help right away. Call 911. Do not wait to see if the symptoms will go away. Do not drive yourself to the hospital. This information is not intended to replace advice given to you by your health care provider. Make sure you discuss any questions you have with your health care provider. Document Revised: 12/26/2021 Document Reviewed: 12/26/2021 Elsevier Patient Education  2024 [employer].

## 2022-11-18 NOTE — Progress Notes (Signed)
Fatigue/weakness: yes, lt side very weak Dyspena: yes Light headedness: yes Blood in stool:  no  Pt states she is having b/l eye pain and trouble seeing, started this morning. Can see outlines.  What can she take to help sleep?  Was taken off fluid rx in hospital, feels like she's gaining wt and harder to breathe.

## 2022-11-19 DIAGNOSIS — I2510 Atherosclerotic heart disease of native coronary artery without angina pectoris: Secondary | ICD-10-CM | POA: Diagnosis not present

## 2022-11-19 DIAGNOSIS — I5042 Chronic combined systolic (congestive) and diastolic (congestive) heart failure: Secondary | ICD-10-CM | POA: Diagnosis not present

## 2022-11-19 DIAGNOSIS — I130 Hypertensive heart and chronic kidney disease with heart failure and stage 1 through stage 4 chronic kidney disease, or unspecified chronic kidney disease: Secondary | ICD-10-CM | POA: Diagnosis not present

## 2022-11-19 DIAGNOSIS — N1832 Chronic kidney disease, stage 3b: Secondary | ICD-10-CM | POA: Diagnosis not present

## 2022-11-19 DIAGNOSIS — D631 Anemia in chronic kidney disease: Secondary | ICD-10-CM | POA: Diagnosis not present

## 2022-11-19 DIAGNOSIS — E1122 Type 2 diabetes mellitus with diabetic chronic kidney disease: Secondary | ICD-10-CM | POA: Diagnosis not present

## 2022-11-19 DIAGNOSIS — I2721 Secondary pulmonary arterial hypertension: Secondary | ICD-10-CM | POA: Diagnosis not present

## 2022-11-19 DIAGNOSIS — I4821 Permanent atrial fibrillation: Secondary | ICD-10-CM | POA: Diagnosis not present

## 2022-11-19 DIAGNOSIS — M549 Dorsalgia, unspecified: Secondary | ICD-10-CM | POA: Diagnosis not present

## 2022-11-20 ENCOUNTER — Telehealth: Payer: Self-pay

## 2022-11-20 NOTE — Progress Notes (Signed)
Pt called requesting an appt with Dr. Shirlee Latch stating it has been a while since she has last been seen. Pt also mentioned having blurry vision and possible fluid retention. Advised pt to go to ER to be evaluated. also scheduled pt an appt with Dr Shirlee Latch. Pt stated she would get her duaghter to take her to the ER.

## 2022-11-20 NOTE — Telephone Encounter (Signed)
Aneta Mins from [facility] called clinic for a second time stating that patient was taken off Torsemide and Potassium by her Oncologist asking what should be done.Aneta Mins was instructed that Oncologist should be called  Patient has not been seen in our clinic since 2019 by Foster Simpson. When called earlier stating patient was having blurry vision and fluid retention. Aneta Mins and patient instructed that patient should go to ER to be checked but patient and daughter are refusing.

## 2022-11-22 DIAGNOSIS — E1122 Type 2 diabetes mellitus with diabetic chronic kidney disease: Secondary | ICD-10-CM | POA: Diagnosis not present

## 2022-11-22 DIAGNOSIS — I2510 Atherosclerotic heart disease of native coronary artery without angina pectoris: Secondary | ICD-10-CM | POA: Diagnosis not present

## 2022-11-22 DIAGNOSIS — N1832 Chronic kidney disease, stage 3b: Secondary | ICD-10-CM | POA: Diagnosis not present

## 2022-11-22 DIAGNOSIS — D631 Anemia in chronic kidney disease: Secondary | ICD-10-CM | POA: Diagnosis not present

## 2022-11-22 DIAGNOSIS — M549 Dorsalgia, unspecified: Secondary | ICD-10-CM | POA: Diagnosis not present

## 2022-11-22 DIAGNOSIS — I5042 Chronic combined systolic (congestive) and diastolic (congestive) heart failure: Secondary | ICD-10-CM | POA: Diagnosis not present

## 2022-11-22 DIAGNOSIS — I130 Hypertensive heart and chronic kidney disease with heart failure and stage 1 through stage 4 chronic kidney disease, or unspecified chronic kidney disease: Secondary | ICD-10-CM | POA: Diagnosis not present

## 2022-11-22 DIAGNOSIS — I2721 Secondary pulmonary arterial hypertension: Secondary | ICD-10-CM | POA: Diagnosis not present

## 2022-11-22 DIAGNOSIS — I4821 Permanent atrial fibrillation: Secondary | ICD-10-CM | POA: Diagnosis not present

## 2022-11-25 ENCOUNTER — Ambulatory Visit (INDEPENDENT_AMBULATORY_CARE_PROVIDER_SITE_OTHER): Payer: Medicare HMO

## 2022-11-25 DIAGNOSIS — I495 Sick sinus syndrome: Secondary | ICD-10-CM

## 2022-11-27 LAB — CUP PACEART REMOTE DEVICE CHECK
Battery Remaining Longevity: 42 mo
Battery Remaining Percentage: 45 %
Brady Statistic RA Percent Paced: 83 %
Brady Statistic RV Percent Paced: 97 %
Date Time Interrogation Session: 20240606032600
Implantable Lead Connection Status: 753985
Implantable Lead Connection Status: 753985
Implantable Lead Implant Date: 20150720
Implantable Lead Implant Date: 20150720
Implantable Lead Location: 753859
Implantable Lead Location: 753860
Implantable Lead Model: 5076
Implantable Lead Model: 5076
Implantable Pulse Generator Implant Date: 20150720
Lead Channel Impedance Value: 348 Ohm
Lead Channel Impedance Value: 510 Ohm
Lead Channel Pacing Threshold Amplitude: 0.7 V
Lead Channel Pacing Threshold Pulse Width: 0.4 ms
Lead Channel Setting Pacing Amplitude: 2 V
Lead Channel Setting Pacing Amplitude: 2.4 V
Lead Channel Setting Pacing Pulse Width: 0.4 ms
Lead Channel Setting Sensing Sensitivity: 2.5 mV
Pulse Gen Serial Number: 390330
Zone Setting Status: 755011

## 2022-11-28 ENCOUNTER — Other Ambulatory Visit (HOSPITAL_COMMUNITY): Payer: Self-pay | Admitting: Cardiology

## 2022-11-28 ENCOUNTER — Ambulatory Visit (INDEPENDENT_AMBULATORY_CARE_PROVIDER_SITE_OTHER): Payer: Medicare HMO | Admitting: Family Medicine

## 2022-11-28 ENCOUNTER — Encounter: Payer: Self-pay | Admitting: Family Medicine

## 2022-11-28 VITALS — BP 122/70 | HR 60 | Temp 98.5°F | Ht 60.0 in | Wt 200.6 lb

## 2022-11-28 DIAGNOSIS — I5032 Chronic diastolic (congestive) heart failure: Secondary | ICD-10-CM | POA: Diagnosis not present

## 2022-11-28 DIAGNOSIS — K529 Noninfective gastroenteritis and colitis, unspecified: Secondary | ICD-10-CM

## 2022-11-28 DIAGNOSIS — E785 Hyperlipidemia, unspecified: Secondary | ICD-10-CM

## 2022-11-28 DIAGNOSIS — R1013 Epigastric pain: Secondary | ICD-10-CM

## 2022-11-28 MED ORDER — PANTOPRAZOLE SODIUM 40 MG PO TBEC
40.0000 mg | DELAYED_RELEASE_TABLET | Freq: Every day | ORAL | 0 refills | Status: DC
Start: 2022-11-28 — End: 2023-02-26

## 2022-11-28 NOTE — Assessment & Plan Note (Signed)
Chronic issue.  Has improved some.  It is very possible that some of this may be musculoskeletal issue though I would not expect a musculoskeletal cause of abdominal pain to worsen with eating.  She will see pain management as planned.  We will trial Protonix 40 mg before breakfast.  She will see GI for follow-up.

## 2022-11-28 NOTE — Progress Notes (Signed)
[name], MD Phone: [phone]  Debra Saunders is a 77 y.o. female who presents today for follow-up.  Epigastric pain/chronic diarrhea: Patient saw general surgery and they noted that the epigastric pain was not related to a surgical issue.  She saw gynecology and they advised that they could go in to take her ovaries out if she was going to have surgery by the general surgeon.  She continues to have epigastric discomfort that is described as a grabbing sensation.  The pain worsens when she eats.  Notes the discomfort has improved to a certain degree.  She has had chronic diarrhea for a long time.  Anytime she eats she almost immediately has to have a bowel movement.  She does occasionally have some bleeding after having the diarrhea.  She did recently have endoscopy, colonoscopy, and capsule endoscopy.  CHF: Patient reports her shortness of breath has improved.  She is on torsemide once daily.  She notes no change to chronic edema.  Social History   Tobacco Use  Smoking Status Former   Packs/day: 0.30   Years: 2.00   Additional pack years: 0.00   Total pack years: 0.60   Types: Cigarettes   Quit date: 06/23/1990   Years since quitting: 32.4   Passive exposure: Past  Smokeless Tobacco Never    Current Outpatient Medications on File Prior to Visit  Medication Sig Dispense Refill   acetaminophen (TYLENOL) 325 MG tablet Take 325-650 mg by mouth every 6 (six) hours as needed for mild pain.     albuterol (ACCUNEB) 0.63 MG/3ML nebulizer solution Take 1 ampule by nebulization every 6 (six) hours as needed for wheezing.     apixaban (ELIQUIS) 5 MG TABS tablet Take 1 tablet (5 mg total) by mouth 2 (two) times daily. 180 tablet 3   atropine 1 % ophthalmic solution Place 1 drop into both eyes at bedtime.     DULoxetine (CYMBALTA) 60 MG capsule Take 60 mg by mouth daily.     gabapentin (NEURONTIN) 300 MG capsule Take 1 capsule (300 mg total) by mouth 2 (two) times daily. 60 capsule 0    lidocaine (LIDODERM) 5 % Place 2 patches onto the skin daily. Remove & Discard patch within 12 hours or as directed by MD 30 patch 0   macitentan (OPSUMIT) 10 MG tablet Take 1 tablet (10 mg total) by mouth daily. 90 tablet 3   meclizine (ANTIVERT) 25 MG tablet Take 1 tablet (25 mg total) by mouth 3 (three) times daily as needed for dizziness. 30 tablet 1   Menthol, Topical Analgesic, (BIOFREEZE EX) Apply 1 application topically 4 (four) times daily as needed (for pain).     midodrine (PROAMATINE) 5 MG tablet Take one tablet at 12 noon, 4pm and 8pm 270 tablet 3   oxyCODONE (OXY IR/ROXICODONE) 5 MG immediate release tablet Take 1 tablet (5 mg total) by mouth every 4 (four) hours as needed for moderate pain, breakthrough pain or severe pain. 12 tablet 0   polyethylene glycol (MIRALAX / GLYCOLAX) 17 g packet Take 17 g by mouth daily. 14 each 0   potassium chloride SA (KLOR-CON M) 20 MEQ tablet 1 pill twice a day 60 tablet 3   Selexipag (UPTRAVI) 1600 MCG TABS Take 1 tablet (1,600 mcg total) by mouth 2 (two) times daily. 60 tablet 11   sildenafil (REVATIO) 20 MG tablet Take 1 tablet (20 mg total) by mouth 3 (three) times daily. 90 tablet 3   torsemide (DEMADEX) 20 MG tablet  Take 1 tablet (20 mg total) by mouth daily.     No current facility-administered medications on file prior to visit.     ROS see history of present illness  Objective  Physical Exam Vitals:   11/28/22 1643  BP: 122/70  Pulse: 60  Temp: 98.5 F (36.9 C)  SpO2: 93%    BP Readings from Last 3 Encounters:  11/28/22 122/70  11/18/22 (!) 178/98  11/18/22 (!) 168/120   Wt Readings from Last 3 Encounters:  11/28/22 200 lb 9.6 oz (91 kg)  11/18/22 206 lb (93.4 kg)  11/18/22 206 lb 11.2 oz (93.8 kg)    Physical Exam Constitutional:      General: She is not in acute distress.    Appearance: She is not diaphoretic.  Cardiovascular:     Rate and Rhythm: Normal rate and regular rhythm.     Heart sounds: Normal heart  sounds.  Pulmonary:     Effort: Pulmonary effort is normal.     Breath sounds: Normal breath sounds.  Abdominal:     General: Bowel sounds are normal. There is no distension.     Palpations: Abdomen is soft.     Tenderness: There is abdominal tenderness (Tenderness in epigastric region that is worsened with tensing up musculature and palpating at that time).  Skin:    General: Skin is warm and dry.  Neurological:     Mental Status: She is alert.      Assessment/Plan: Please see individual problem list.  Epigastric pain Assessment & Plan: Chronic issue.  Has improved some.  It is very possible that some of this may be musculoskeletal issue though I would not expect a musculoskeletal cause of abdominal pain to worsen with eating.  She will see pain management as planned.  We will trial Protonix 40 mg before breakfast.  She will see GI for follow-up.  Orders: -     Pantoprazole Sodium; Take 1 tablet (40 mg total) by mouth daily before breakfast for 14 days.  Dispense: 14 tablet; Refill: 0 -     Ambulatory referral to Gastroenterology  Chronic diarrhea Assessment & Plan: Chronic issue.  Refer back to GI.  Orders: -     Ambulatory referral to Gastroenterology  Chronic diastolic CHF (congestive heart failure) (HCC) Assessment & Plan: Chronic issue.  Breathing has improved.  Weight has trended down slightly over the last week or so.  She will continue torsemide 20 mg daily.  Her hematologist placed her on potassium supplementation.  Will check a BMP next week.  Orders: -     Basic metabolic panel; Future    Return in about 5 days (around 12/03/2022) for Labs, 3 months PCP.   [name], MD [facility]

## 2022-11-28 NOTE — Assessment & Plan Note (Signed)
Chronic issue.  Refer back to GI.

## 2022-11-28 NOTE — Assessment & Plan Note (Signed)
Chronic issue.  Breathing has improved.  Weight has trended down slightly over the last week or so.  She will continue torsemide 20 mg daily.  Her hematologist placed her on potassium supplementation.  Will check a BMP next week.

## 2022-12-03 ENCOUNTER — Other Ambulatory Visit: Payer: Medicare HMO

## 2022-12-09 ENCOUNTER — Other Ambulatory Visit: Payer: Self-pay | Admitting: Internal Medicine

## 2022-12-09 DIAGNOSIS — E119 Type 2 diabetes mellitus without complications: Secondary | ICD-10-CM | POA: Diagnosis not present

## 2022-12-09 DIAGNOSIS — H40003 Preglaucoma, unspecified, bilateral: Secondary | ICD-10-CM | POA: Diagnosis not present

## 2022-12-09 DIAGNOSIS — H2013 Chronic iridocyclitis, bilateral: Secondary | ICD-10-CM | POA: Diagnosis not present

## 2022-12-09 DIAGNOSIS — Z961 Presence of intraocular lens: Secondary | ICD-10-CM | POA: Diagnosis not present

## 2022-12-09 LAB — HM DIABETES EYE EXAM

## 2022-12-10 NOTE — Telephone Encounter (Signed)
Patient last seen 11/2021 and was advised to follow up in 4 weeks. Do you want to fill again? I sent a message for her to schedule follow up with [place].   Thanks!

## 2022-12-18 MED FILL — Iron Sucrose Inj 20 MG/ML (Fe Equiv): INTRAVENOUS | Qty: 10 | Status: AC

## 2022-12-18 NOTE — Progress Notes (Signed)
Remote pacemaker transmission.   

## 2022-12-19 ENCOUNTER — Inpatient Hospital Stay (HOSPITAL_BASED_OUTPATIENT_CLINIC_OR_DEPARTMENT_OTHER): Payer: Medicare HMO | Admitting: Medical Oncology

## 2022-12-19 ENCOUNTER — Inpatient Hospital Stay: Payer: Medicare HMO

## 2022-12-19 ENCOUNTER — Encounter: Payer: Self-pay | Admitting: Medical Oncology

## 2022-12-19 ENCOUNTER — Inpatient Hospital Stay: Payer: Medicare HMO | Attending: Internal Medicine

## 2022-12-19 VITALS — BP 142/75 | HR 60 | Temp 97.2°F | Resp 20 | Ht 60.0 in | Wt 199.0 lb

## 2022-12-19 DIAGNOSIS — N183 Chronic kidney disease, stage 3 unspecified: Secondary | ICD-10-CM | POA: Diagnosis not present

## 2022-12-19 DIAGNOSIS — E538 Deficiency of other specified B group vitamins: Secondary | ICD-10-CM | POA: Insufficient documentation

## 2022-12-19 DIAGNOSIS — D649 Anemia, unspecified: Secondary | ICD-10-CM | POA: Diagnosis not present

## 2022-12-19 DIAGNOSIS — N289 Disorder of kidney and ureter, unspecified: Secondary | ICD-10-CM

## 2022-12-19 DIAGNOSIS — R222 Localized swelling, mass and lump, trunk: Secondary | ICD-10-CM | POA: Diagnosis not present

## 2022-12-19 DIAGNOSIS — E611 Iron deficiency: Secondary | ICD-10-CM | POA: Diagnosis not present

## 2022-12-19 DIAGNOSIS — D631 Anemia in chronic kidney disease: Secondary | ICD-10-CM | POA: Diagnosis not present

## 2022-12-19 LAB — CBC WITH DIFFERENTIAL (CANCER CENTER ONLY)
Abs Immature Granulocytes: 0.01 10*3/uL (ref 0.00–0.07)
Basophils Absolute: 0 10*3/uL (ref 0.0–0.1)
Basophils Relative: 0 %
Eosinophils Absolute: 0.2 10*3/uL (ref 0.0–0.5)
Eosinophils Relative: 4 %
HCT: 32.8 % — ABNORMAL LOW (ref 36.0–46.0)
Hemoglobin: 9.8 g/dL — ABNORMAL LOW (ref 12.0–15.0)
Immature Granulocytes: 0 %
Lymphocytes Relative: 27 %
Lymphs Abs: 1.2 10*3/uL (ref 0.7–4.0)
MCH: 27.6 pg (ref 26.0–34.0)
MCHC: 29.9 g/dL — ABNORMAL LOW (ref 30.0–36.0)
MCV: 92.4 fL (ref 80.0–100.0)
Monocytes Absolute: 0.3 10*3/uL (ref 0.1–1.0)
Monocytes Relative: 7 %
Neutro Abs: 2.6 10*3/uL (ref 1.7–7.7)
Neutrophils Relative %: 62 %
Platelet Count: 184 10*3/uL (ref 150–400)
RBC: 3.55 MIL/uL — ABNORMAL LOW (ref 3.87–5.11)
RDW: 12.1 % (ref 11.5–15.5)
WBC Count: 4.3 10*3/uL (ref 4.0–10.5)
nRBC: 0 % (ref 0.0–0.2)

## 2022-12-19 LAB — BASIC METABOLIC PANEL - CANCER CENTER ONLY
Anion gap: 9 (ref 5–15)
BUN: 15 mg/dL (ref 8–23)
CO2: 29 mmol/L (ref 22–32)
Calcium: 8.9 mg/dL (ref 8.9–10.3)
Chloride: 102 mmol/L (ref 98–111)
Creatinine: 0.93 mg/dL (ref 0.44–1.00)
GFR, Estimated: >60 mL/min (ref >60)
Glucose, Bld: 108 mg/dL — ABNORMAL HIGH (ref 70–99)
Potassium: 3.6 mmol/L (ref 3.5–5.1)
Sodium: 140 mmol/L (ref 135–145)

## 2022-12-19 LAB — VITAMIN B12: Vitamin B-12: 246 pg/mL (ref 180–914)

## 2022-12-19 LAB — IRON AND TIBC
Iron: 61 ug/dL (ref 28–170)
Saturation Ratios: 17 % (ref 10.4–31.8)
TIBC: 356 ug/dL (ref 250–450)
UIBC: 295 ug/dL

## 2022-12-19 LAB — FERRITIN: Ferritin: 506 ng/mL — ABNORMAL HIGH (ref 11–307)

## 2022-12-19 MED ORDER — CYANOCOBALAMIN 1000 MCG/ML IJ SOLN
1000.0000 ug | Freq: Once | INTRAMUSCULAR | Status: AC
  Administered 2022-12-19: 1000 ug via INTRAMUSCULAR
  Filled 2022-12-19: qty 1

## 2022-12-19 MED ORDER — EPOETIN ALFA-EPBX 20000 UNIT/ML IJ SOLN
20000.0000 [IU] | Freq: Once | INTRAMUSCULAR | Status: AC
  Administered 2022-12-19: 20000 [IU] via SUBCUTANEOUS
  Filled 2022-12-19: qty 1

## 2022-12-19 NOTE — Progress Notes (Signed)
[facility] CONSULT NOTE  Patient Care Team: Glori Luis, MD as PCP - General (Family Medicine) Iran Ouch, MD as PCP - Cardiology (Cardiology) [facility] Salvia, MD as PCP - Electrophysiology (Cardiology) Carolee Rota, MD as Referring Physician (Allergy) Lupita Leash, MD as Referring Physician (Internal Medicine) Earna Coder, MD as Consulting Physician (Hematology and Oncology) Anemia CHIEF COMPLAINTS/PURPOSE OF CONSULTATION: Anemia   HEMATOLOGY HISTORY  # CHORNIC IRON DEFICIENCY ANEMIA [BL 10-11]-Jan today 21 PCP Hb-8.8; Maralyn Sago sat-5%; ferritin 11] EGD-; colonoscopy MARCH 2020 -polyp [[facility]-hold further work-up secondary to cardiac issues]; remote-EGD-?;  February 2021-LDH/haptoglobin normal; kappa lambda light chain ratio/M panel normal;  on Venofer; November 2020-CT scan question early cirrhosis/no splenomegaly.  No acute process; MARCH 2021- STOOL OCCULT x3- NEGATIVE.   #B12 deficiency-IM every 3 to 4 months  #2014 -question blood transfusion  [patient admitted for stroke]; CHF chronic -[ 2004 on 4 lits; Dr.Arida/Klein]; CT scan chest 2021 -no adenopathy; fibrotic changes; walker; CKD stage III-   #Incidental right axillary mass-s/p US biopsy [PCP] -negative for malignancy.  HISTORY OF PRESENTING ILLNESS: Patient in wheelchair on [place] O2.  Alone.  Debra Saunders 77 y.o.  female multiple comorbidities including COPD/chronic typically failure on oxygen and anemia/likely secondary to ?CKD stage III -as needed IV iron is here for follow-up. Recent EGD/capsule study/colonoscopy unremarkable.    Today she states that she has been doing ok. No new concerns but she has chronic fatigue. She continued to wear her portable O2.   No blood in stools ; no nausea vomiting.  Continues to be compliant with her Eliquis without bleeding episodes.    Review of Systems  Constitutional:  Positive for malaise/fatigue. Negative for chills, diaphoresis, fever  and weight loss.  HENT:  Negative for nosebleeds and sore throat.   Eyes:  Negative for double vision.  Respiratory:  Positive for shortness of breath. Negative for cough, hemoptysis, sputum production and wheezing.   Cardiovascular:  Negative for chest pain, palpitations, orthopnea and leg swelling.  Gastrointestinal:  Negative for abdominal pain, blood in stool, constipation, heartburn, melena, nausea and vomiting.  Genitourinary:  Negative for dysuria, frequency and urgency.  Musculoskeletal:  Negative for back pain and joint pain.  Skin: Negative.  Negative for itching and rash.  Neurological:  Negative for dizziness, tingling, focal weakness, weakness and headaches.  Endo/Heme/Allergies:  Does not bruise/bleed easily.  Psychiatric/Behavioral:  Negative for depression. The patient is not nervous/anxious and does not have insomnia.     MEDICAL HISTORY:  Past Medical History:  Diagnosis Date   Arthritis    Atrial fibrillation, persistent (HCC)    a. s/p TEE-guided DCCV on 08/20/2017; b. 06/2021 Recurrent Afib-->amio/TEE/DCCV.   CAD (coronary artery disease)    a. 01/2012 Cath: nonobs dzs; b. 04/2014 MV: No ischemia; c. 09/2017 Cath: Minimal nonobs RCA dzs, otw nl cors.   Cardiorenal syndrome    a. 09/2017 Creat rose to 2.7 w/ diuresis-->HD x 2.   Chronic back pain    Chronic combined systolic (congestive) and diastolic (congestive) heart failure (HCC)    a. Dx 2005 @ [facility];  b. 02/2014 Echo: EF 55-60%; c. 07/2017: EF 30-35%; d. 09/2017 Echo: EF 40-45%; e. 02/2018 Echo: EF 55-60%; f. 08/2019 Echo: EF 60-65%; g. 08/2020 RHC: RA 3, RV 30/1, PA 33/11 (20), PCWP 6; f. 08/2021 Echo: EF 60-65%, no rwma, GrIDD, nl RV fxn, RVSP 40.49mmHg, mod dil LA, mod TR.   Chronic respiratory failure (HCC)  a. on home O2   Gastritis    History of COVID-19    History of endocarditis 08/22/2021   History of intracranial hemorrhage    a. 6/14 described by neurosurgery as "small frontal posttraumatic SAH " - pt was on  xarelto @ the time.   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 Chronic LLE edema.   NICM (nonischemic cardiomyopathy) (HCC)    a. 09/2017 Echo: EF 40-45%; b. 09/2017 Cath: minor, insignificant CAD; c. 02/2018 Echo: EF 55-60%, Gr1 DD; d. 08/2019 Echo: EF 60-65%, Gr2 DD.   Obesity    PAH (pulmonary artery hypertension) (HCC)    a. 09/2017 Echo: PASP ; b. 09/2017 RHC: RA 23, RV 84/13, PA 84/29, PCWP 20, CO 3.89, CI 1.89. LVEDP 18; c. 08/2020 RHC: RA 3, RV 30/1, PA 33/11 (20), PCWP 6. CO/CI 6.18/3.36. PVR 2.3 WU.   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 06/2021. Veg on PPM lead. Not candidate for extraction-->s/p 6 wks abx.   Sepsis with metabolic encephalopathy (HCC)    Sleep apnea    Streptococcal bacteremia    Stroke [facility]) 2014   Subarachnoid hemorrhage (HCC) 01/19/2013   Symptomatic bradycardia    a. s/p BSX PPM.   Thyroid nodule    Type II diabetes mellitus (HCC)    Urinary incontinence     SURGICAL HISTORY: Past Surgical History:  Procedure Laterality Date   ABDOMINAL HYSTERECTOMY  1969   BREAST BIOPSY Right 10/28/2019   lymph node bx, [place] Bx, pending path    CARDIAC CATHETERIZATION  2013   @ [facility]: No obstructive CAD: Only 20% ostial left circumflex.   CARDIOVERSION N/A 10/12/2017   Procedure: CARDIOVERSION;  Surgeon: Antonieta Iba, MD;  Location: [facility] ORS;  Service: Cardiovascular;  Laterality: N/A;   CARDIOVERSION N/A 07/04/2021   Procedure: CARDIOVERSION;  Surgeon: Laurey Morale, MD;  Location: [facility] ENDOSCOPY;  Service: Cardiovascular;  Laterality: N/A;   CATARACT EXTRACTION W/PHACO Right 09/19/2020   Procedure: CATARACT EXTRACTION PHACO AND INTRAOCULAR LENS PLACEMENT (IOC) RIGHT DIABETIC 3.95 00:43.0 9.2%;  Surgeon: Lockie Mola, MD;  Location: [facility] SURGERY CNTR;  Service:  Ophthalmology;  Laterality: Right;  Pt requests to be last sleep apnea   CATARACT EXTRACTION W/PHACO Left 10/03/2020   Procedure: CATARACT EXTRACTION PHACO AND INTRAOCULAR LENS PLACEMENT (IOC) LEFT DIABETIC;  Surgeon: Lockie Mola, MD;  Location: [facility] SURGERY CNTR;  Service: Ophthalmology;  Laterality: Left;  3.25 0:59.3 5.5%   CHOLECYSTECTOMY     COLONOSCOPY WITH PROPOFOL N/A 09/10/2018   Procedure: COLONOSCOPY WITH PROPOFOL;  Surgeon: Scot Jun, MD;  Location: [facility] ENDOSCOPY;  Service: Endoscopy;  Laterality: N/A;   COLONOSCOPY WITH PROPOFOL N/A 10/22/2022   Procedure: COLONOSCOPY WITH PROPOFOL;  Surgeon: Jaynie Collins, DO;  Location: [facility] ENDOSCOPY;  Service: Gastroenterology;  Laterality: N/A;   CYST REMOVAL TRUNK  2002   BACK   DIALYSIS/PERMA CATHETER INSERTION N/A 10/06/2017   Procedure: DIALYSIS/PERMA CATHETER INSERTION;  Surgeon: Renford Dills, MD;  Location: [facility] INVASIVE CV LAB;  Service: Cardiovascular;  Laterality: N/A;   ESOPHAGOGASTRODUODENOSCOPY (EGD) WITH PROPOFOL N/A 10/21/2022   Procedure: ESOPHAGOGASTRODUODENOSCOPY (EGD) WITH PROPOFOL;  Surgeon: Jaynie Collins, DO;  Location: [facility] ENDOSCOPY;  Service: Gastroenterology;  Laterality: N/A;   GIVENS CAPSULE STUDY N/A 10/22/2022   Procedure:  GIVENS CAPSULE STUDY;  Surgeon: Jaynie Collins, DO;  Location: [facility] ENDOSCOPY;  Service: Gastroenterology;  Laterality: N/A;  IF NOTHING IS FOUND WITH COLONOSCOPY   INSERT / REPLACE / REMOVE PACEMAKER     lipoma removal  2014   back   PACEMAKER INSERTION  2015   [employer] dual chamber pacemaker implanted by Dr Graciela Husbands for symptomatic bradycardia   PERMANENT PACEMAKER INSERTION N/A 01/09/2014   Procedure: PERMANENT PACEMAKER INSERTION;  Surgeon: [facility] Salvia, MD;  Location: [facility] CATH LAB;  Service: Cardiovascular;  Laterality: N/A;   RIGHT HEART CATH N/A 04/19/2018   Procedure: RIGHT HEART CATH;  Surgeon: Laurey Morale, MD;  Location: [facility]  INVASIVE CV LAB;  Service: Cardiovascular;  Laterality: N/A;   RIGHT HEART CATH N/A 08/30/2020   Procedure: RIGHT HEART CATH;  Surgeon: Laurey Morale, MD;  Location: [facility] INVASIVE CV LAB;  Service: Cardiovascular;  Laterality: N/A;   RIGHT HEART CATH N/A 02/11/2022   Procedure: RIGHT HEART CATH;  Surgeon: Laurey Morale, MD;  Location: [facility] INVASIVE CV LAB;  Service: Cardiovascular;  Laterality: N/A;   RIGHT/LEFT HEART CATH AND CORONARY ANGIOGRAPHY N/A 10/19/2017   Procedure: RIGHT/LEFT HEART CATH AND CORONARY ANGIOGRAPHY;  Surgeon: Iran Ouch, MD;  Location: [facility] INVASIVE CV LAB;  Service: Cardiovascular;  Laterality: N/A;   TEE WITHOUT CARDIOVERSION N/A 08/20/2017   Procedure: TRANSESOPHAGEAL ECHOCARDIOGRAM (TEE) & Direct current cardioversion;  Surgeon: Antonieta Iba, MD;  Location: [facility] ORS;  Service: Cardiovascular;  Laterality: N/A;   TEE WITHOUT CARDIOVERSION N/A 06/28/2021   Procedure: TRANSESOPHAGEAL ECHOCARDIOGRAM (TEE);  Surgeon: Antonieta Iba, MD;  Location: [facility] ORS;  Service: Cardiovascular;  Laterality: N/A;   TEE WITHOUT CARDIOVERSION N/A 07/04/2021   Procedure: TRANSESOPHAGEAL ECHOCARDIOGRAM (TEE);  Surgeon: Laurey Morale, MD;  Location: [facility] ENDOSCOPY;  Service: Cardiovascular;  Laterality: N/A;   TRIGGER FINGER RELEASE      SOCIAL HISTORY: Social History   Socioeconomic History   Marital status: Divorced    Spouse name: Not on file   Number of children: 4   Years of education: 12   Highest education level: High school graduate  Occupational History   Occupation: Retired- factory work    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 Has 4 children. No pets.      Work - Hotel manager, retired      Diet - regular      02/14/2019: reports has Meals On Wheels delivered to her home;       Remote hx of smoking > 30 years; No alcohol-    Social Determinants of Health   Financial Resource Strain: Low Risk  (02/19/2022)   Overall Financial Resource Strain (CARDIA)    Difficulty of Paying Living Expenses: Not hard at all  Food Insecurity: No Food Insecurity (10/20/2022)  Hunger Vital Sign    Worried About Running Out of Food in the Last Year: Never true    Ran Out of Food in the Last Year: Never true  Transportation Needs: Unmet Transportation Needs (11/18/2022)   PRAPARE - Transportation    Lack of Transportation (Medical): Yes    Lack of Transportation (Non-Medical): Yes  Physical Activity: Inactive (01/05/2018)   Exercise Vital Sign    Days of Exercise per Week: 0 days    Minutes of Exercise per Session: 0 min  Stress: No Stress Concern Present (02/19/2022)   [employer] of Occupational Health - Occupational Stress Questionnaire    Feeling of Stress : Not at all  Social Connections: Socially Isolated (02/19/2022)   Social Connection and Isolation Panel [NHANES]    Frequency of Communication with Friends and Family: More than three times a week    Frequency of Social Gatherings with Friends and Family: Once a week    Attends Religious Services: Never    Database administrator or Organizations: No    Attends Banker Meetings: Never    Marital Status: Divorced  Catering manager Violence: Not At Risk (10/20/2022)   Humiliation, Afraid, Rape, and Kick questionnaire    Fear of Current or Ex-Partner: No    Emotionally Abused: No    Physically Abused: No    Sexually Abused: No    FAMILY HISTORY: Family  History  Problem Relation Age of Onset   Heart disease Mother    Hypertension Mother    COPD Mother        was a smoker   Thyroid disease Mother    Hypertension Father    Hypertension Sister    Thyroid disease Sister    Breast cancer Sister    Hypertension Sister    Thyroid disease Sister    Bone cancer Sister    Stroke Son    Breast cancer Maternal Aunt    Kidney disease Neg Hx    Bladder Cancer Neg Hx     ALLERGIES:  is allergic to aspirin and other.  MEDICATIONS:  Current Outpatient Medications  Medication Sig Dispense Refill   acetaminophen (TYLENOL) 325 MG tablet Take 325-650 mg by mouth every 6 (six) hours as needed for mild pain.     albuterol (ACCUNEB) 0.63 MG/3ML nebulizer solution Take 1 ampule by nebulization every 6 (six) hours as needed for wheezing.     apixaban (ELIQUIS) 5 MG TABS tablet Take 1 tablet (5 mg total) by mouth 2 (two) times daily. 180 tablet 3   atropine 1 % ophthalmic solution Place 1 drop into both eyes at bedtime.     cefadroxil (DURICEF) 500 MG capsule Take 1 capsule by mouth twice daily 60 capsule 0   DULoxetine (CYMBALTA) 60 MG capsule Take 60 mg by mouth daily.     gabapentin (NEURONTIN) 300 MG capsule Take 1 capsule (300 mg total) by mouth 2 (two) times daily. 60 capsule 0   lidocaine (LIDODERM) 5 % Place 2 patches onto the skin daily. Remove & Discard patch within 12 hours or as directed by MD 30 patch 0   macitentan (OPSUMIT) 10 MG tablet Take 1 tablet (10 mg total) by mouth daily. 90 tablet 3   meclizine (ANTIVERT) 25 MG tablet Take 1 tablet (25 mg total) by mouth 3 (three) times daily as needed for dizziness. 30 tablet 1   Menthol, Topical Analgesic, (BIOFREEZE EX) Apply 1 application topically 4 (four) times daily as needed (for pain).  midodrine (PROAMATINE) 5 MG tablet Take one tablet at 12 noon, 4pm and 8pm 270 tablet 3   oxyCODONE (OXY IR/ROXICODONE) 5 MG immediate release tablet Take 1 tablet (5 mg total) by mouth every 4 (four)  hours as needed for moderate pain, breakthrough pain or severe pain. 12 tablet 0   polyethylene glycol (MIRALAX / GLYCOLAX) 17 g packet Take 17 g by mouth daily. 14 each 0   potassium chloride SA (KLOR-CON M) 20 MEQ tablet 1 pill twice a day 60 tablet 3   rosuvastatin (CRESTOR) 10 MG tablet Take 1 tablet by mouth once daily 90 tablet 0   Selexipag (UPTRAVI) 1600 MCG TABS Take 1 tablet (1,600 mcg total) by mouth 2 (two) times daily. 60 tablet 11   sildenafil (REVATIO) 20 MG tablet Take 1 tablet (20 mg total) by mouth 3 (three) times daily. 90 tablet 3   torsemide (DEMADEX) 20 MG tablet Take 1 tablet (20 mg total) by mouth daily.     pantoprazole (PROTONIX) 40 MG tablet Take 1 tablet (40 mg total) by mouth daily before breakfast for 14 days. 14 tablet 0   No current facility-administered medications for this visit.      PHYSICAL EXAMINATION:   Vitals:   12/19/22 1306  BP: (!) 142/75  Pulse: 60  Resp: 20  Temp: (!) 97.2 F (36.2 C)  SpO2: 100%   Filed Weights   12/19/22 1306  Weight: 199 lb (90.3 kg)    Physical Exam Constitutional:      Comments: Patient in wheelchair.  Alone.  She is on O2 nasal cannula.  HENT:     Head: Normocephalic and atraumatic.     Mouth/Throat:     Pharynx: No oropharyngeal exudate.  Eyes:     Pupils: Pupils are equal, round, and reactive to light.  Cardiovascular:     Rate and Rhythm: Normal rate and regular rhythm.  Pulmonary:     Effort: No respiratory distress.     Breath sounds: No wheezing.     Comments: Decreased air entry bilaterally.  No wheeze or crackles. Abdominal:     General: Bowel sounds are normal. There is no distension.     Palpations: Abdomen is soft. There is no mass.     Tenderness: There is no abdominal tenderness. There is no guarding or rebound.  Musculoskeletal:        General: No tenderness. Normal range of motion.     Cervical back: Normal range of motion and neck supple.  Skin:    General: Skin is warm.   Neurological:     Mental Status: She is alert and oriented to person, place, and time.  Psychiatric:        Mood and Affect: Affect normal.     LABORATORY DATA:  I have reviewed the data as listed Lab Results  Component Value Date   WBC 4.3 12/19/2022   HGB 9.8 (L) 12/19/2022   HCT 32.8 (L) 12/19/2022   MCV 92.4 12/19/2022   PLT 184 12/19/2022   Recent Labs    10/20/22 0423 10/21/22 0404 10/22/22 0143 10/26/22 0554 11/04/22 1144 11/18/22 1302 12/19/22 1236  NA 141 138   < > 134* 142 142 140  K 2.9* 3.9   < > 3.6 4.3 2.9* 3.6  CL 101 101   < > 102 107 104 102  CO2 32 29   < > 24 28 28 29   GLUCOSE 107* 120*   < > 129* 95 114* 108*  BUN 23 19   < > 32* 15 17 15   CREATININE 0.96 0.94   < > 1.26* 0.96 0.85 0.93  CALCIUM 8.7* 9.0   < > 8.4* 8.8 8.5* 8.9  GFRNONAA >60 >60   < > 43*  --  >60 >60  PROT 6.6 6.7  --   --   --  6.8  --   ALBUMIN 3.8 3.7  --   --   --  3.6  --   AST 14* 16  --   --   --  18  --   ALT 12 13  --   --   --  15  --   ALKPHOS 98 102  --   --   --  97  --   BILITOT 0.8 0.6  --   --   --  0.4  --   BILIDIR  --  0.1  --   --   --   --   --   IBILI  --  0.5  --   --   --   --   --    < > = values in this interval not displayed.   # Chronic Anemia [BL10-11]-iron deficiency/? CKD stage III  currently s/p Venofer.    # [DEC 2023- ferritin 382; iron sat 18%]; continue gentle iron 1 pill a day.    Proceed with venofer today. Also discussed the possible role of Retacrit with patient's hemoglobin does not improve.  S/p EGD/ capsule MAY 2024- NEG [hospital]; [place] [Dr.Russo]- MA 2024 - NEG.   # Etiology of anemia-unclear- CKD stage III  vs Others-[Dr.Lateef]/ [chronic diarrhea/malabsorption]-monitor closely.  If anemia is worse consider bone marrow biopsy. Today her Hgb has improved with IV iron. Her Hgb is 9.8 which is up from 8.8 at her last visit. MCV has improved, Iron studies pending. Ferritin elevated (likely reactive) with normal iron saturations. At this  time I suspect that she would benefit from Retacrit. We discussed further. She may need alternating IV iron and retacrit based on labs.     # Mild Hypokalemia- Resolved with BID supplementation. Continue. K 3.6   #History of DVT/s/p IVC filter; ?  A. Fib-Eliquis- stable.    #Borderline low B12-OCT 2023- 230; s/p B12 injection.; proceed with injection q 2- 4 months as planned.    # DISPOSITION: B12 today and retacrit today- hold venofer  Follow up in 1 months-APP-  labs-- cbc;bmp; b12 levels; iron studies; ferritin--possible Venofer OR retacrit;  B12 injection- [facility]       All questions were answered. The patient knows to call the clinic with any problems, questions or concerns.    Rushie Chestnut, PA-C 12/19/2022 2:27 PM

## 2022-12-19 NOTE — Addendum Note (Signed)
Addended by: Luz Lex on: 12/19/2022 03:42 PM   Modules accepted: Orders

## 2022-12-22 ENCOUNTER — Ambulatory Visit: Payer: Medicare HMO

## 2022-12-22 ENCOUNTER — Ambulatory Visit: Payer: Self-pay

## 2022-12-22 NOTE — Patient Instructions (Signed)
Visit Information  Thank you for taking time to visit with me today. Please don't hesitate to contact me if I can be of assistance to you.   Following are the goals we discussed today:   Goals Addressed             This Visit's Progress    management of health conditions.       Interventions Today    Flowsheet Row Most Recent Value  Chronic Disease   Chronic disease during today's visit Other  [dizziness, anemia/ fatigue, abdominal pain]  General Interventions   General Interventions Discussed/Reviewed General Interventions Reviewed, Doctor Visits  [evaluation of treatment plan related to abdominal pain, dizziness, anemia/fatigue and patients adherence to plan as established by provider.  Assessed for ongoing dizzines/anemia fatigue.  Assessed for abdominal pain level]  Doctor Visits Discussed/Reviewed Doctor Visits Reviewed  Annabell Sabal recent ED visit note and provider follow up  status post ED  visit.]  Education Interventions   Education Provided Provided Education  [Advised to notify provider of increase in abdominal pain, dizziness.]  Pharmacy Interventions   Pharmacy Dicussed/Reviewed Pharmacy Topics Reviewed  [reviewed medications and advised compliance.]              Our next appointment is by telephone on 02/05/23 at 2 pm  Please call the care guide team at [phone] if you need to cancel or reschedule your appointment.   If you are experiencing a Mental Health or Behavioral Health Crisis or need someone to talk to, please call the Suicide and Crisis Lifeline: 988 call 1-800-273-TALK (toll free, 24 hour hotline)  Patient verbalizes understanding of instructions and care plan provided today and agrees to view in MyChart. Active MyChart status and patient understanding of how to access instructions and care plan via MyChart confirmed with patient.     George Ina RN,BSN,CCM [facility] Care Coordination [phone] direct line

## 2022-12-22 NOTE — Patient Outreach (Signed)
  Care Coordination   Initial Visit Note   12/22/2022 Name: Debra Saunders MRN: [id number] DOB: 12-13-1945  Debra Saunders is a 77 y.o. year old female who sees Birdie Sons, Yehuda Mao, MD for primary care. I spoke with  [name] by phone today.  What matters to the patients health and wellness today?   Patient reports recent hospitalization on 10/19/22 for abdominal pain.  Patient states she was seen that day by her primary provider who sent her to the ED.  Patient reports having CT, colonoscopy/ endoscopy testing done.  She states a cyst was found on her ovary.  Patient states she was referred to a GYN and Careers adviser.  She states neither would elect to do surgery on her for the reason found from the testing.  Patient states currently her abdominal pain is a 3 on 10 point scale. She states her abdomin seems to hurt worse in the morning when she takes in the first drink or food.  She reports the pain level is usually an 8 at that time.  Patient states dizziness is somewhat better. She reports the fatigue continues. She states she recently had a B12 shot which was not that effective. Patient states she didn't need an infusion due to her iron level being 9.   Goals Addressed             This Visit's Progress    management of health conditions.       Interventions Today    Flowsheet Row Most Recent Value  Chronic Disease   Chronic disease during today's visit Other  [dizziness, anemia/ fatigue, abdominal pain]  General Interventions   General Interventions Discussed/Reviewed General Interventions Reviewed, Doctor Visits  [evaluation of treatment plan related to abdominal pain, dizziness, anemia/fatigue and patients adherence to plan as established by provider.  Assessed for ongoing dizzines/anemia fatigue.  Assessed for abdominal pain level]  Doctor Visits Discussed/Reviewed Doctor Visits Reviewed  Annabell Sabal recent ED visit note and provider follow up  status post ED  visit.]  Education  Interventions   Education Provided Provided Education  [Advised to notify provider of increase in abdominal pain, dizziness.]  Pharmacy Interventions   Pharmacy Dicussed/Reviewed Pharmacy Topics Reviewed  [reviewed medications and advised compliance.]              SDOH assessments and interventions completed:  No     Care Coordination Interventions:  Yes, provided   Follow up plan: Follow up call scheduled for 02/05/23    Encounter Outcome:  Pt. Visit Completed   George Ina RN,BSN,CCM [facility] Care Coordination [phone] direct line

## 2022-12-23 ENCOUNTER — Ambulatory Visit: Payer: Medicare HMO | Admitting: Student in an Organized Health Care Education/Training Program

## 2022-12-24 NOTE — Patient Outreach (Signed)
  Care Coordination   12/24/2022 Late entry 12/22/22 Name: Debra Saunders MRN: [id number] DOB: [birth year]   Care Coordination Outreach Attempts:  Successful contact made with patient.  RNCM asked to call patient at a later time today due to required meeting. Patient verbally agreed to reschedule call.   Follow Up Plan:  Additional outreach attempts will be made to offer the patient care coordination information and services.   Encounter Outcome:  Pt. Request to Call Back   Care Coordination Interventions:  No, not indicated    George Ina [name] Care Coordination [phone] direct line

## 2022-12-29 ENCOUNTER — Encounter: Payer: Self-pay | Admitting: Cardiology

## 2022-12-29 ENCOUNTER — Other Ambulatory Visit (HOSPITAL_COMMUNITY): Payer: Self-pay

## 2022-12-29 ENCOUNTER — Ambulatory Visit (HOSPITAL_BASED_OUTPATIENT_CLINIC_OR_DEPARTMENT_OTHER): Payer: Medicare HMO | Admitting: Cardiology

## 2022-12-29 ENCOUNTER — Other Ambulatory Visit
Admission: RE | Admit: 2022-12-29 | Discharge: 2022-12-29 | Disposition: A | Discharge status: Home / Self Care | Payer: Medicare HMO | Source: Ambulatory Visit | Attending: Cardiology | Admitting: Cardiology

## 2022-12-29 DIAGNOSIS — I5032 Chronic diastolic (congestive) heart failure: Secondary | ICD-10-CM | POA: Diagnosis not present

## 2022-12-29 MED ORDER — DAPAGLIFLOZIN PROPANEDIOL 10 MG PO TABS: 10.0000 mg | ORAL_TABLET | Freq: Every day | ORAL | 6 refills | Status: DC

## 2022-12-29 MED ORDER — POTASSIUM CHLORIDE CRYS ER 20 MEQ PO TBCR: EXTENDED_RELEASE_TABLET | ORAL | 6 refills | Status: DC

## 2022-12-29 MED ORDER — TORSEMIDE 20 MG PO TABS
40.00 mg | ORAL_TABLET | Freq: Every day | ORAL | 6 refills | Status: AC
Start: 2022-12-29 — End: ?

## 2022-12-29 NOTE — Patient Instructions (Signed)
Medication Changes:  Increase Torsemide to 40 mg (2 tablets) daily  Increase Potassium to 40 mEq (2 tablets) in the Morning and 20 mEq (1 tablet) at night.  Farxiga 10 (1 tablet) daily  Lab Work:  Labs done today, your results will be available in MyChart, we will contact you for abnormal readings.   Lab work again in 10 days. Please come to the medical mall entrance to get your labs.   Special Instructions // Education:  Do the following things EVERYDAY: Weigh yourself in the morning before breakfast. Write it down and keep it in a log. Take your medicines as prescribed Eat low salt foods--Limit salt (sodium) to 2000 mg per day.  Stay as active as you can everyday Limit all fluids for the day to less than 2 liters   Follow-Up in: please follow in 6 weeks with Dr. Shirlee Latch.    If you have any questions or concerns before your next appointment please send [place] a message through [place] or call our office at [phone] Monday-Friday 8 am-5 pm.   If you have an urgent need after hours on the weekend please call your Primary Cardiologist or the [facility] in [place] at [phone].

## 2022-12-29 NOTE — Progress Notes (Signed)
Date:  12/29/2022   ID:  Debra Saunders, DOB 1945-11-03, MRN [id number]   Provider location: [place] [facility] Type of Visit: Established patient   PCP:  Glori Luis, MD  Cardiologist:  [name], MD HF Cardiologist: Dr. Shirlee Latch  Chief Complaint: HF follow up   History of Present Illness: Debra Saunders is a 77 y.o. 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   Admitted 10/24-10/29/19 with acute on chronic diastolic HF. HF team consulted with concerns for low output. PICC was placed and milrinone to support RV function was started along with IV Lasix to facilitate diuresis. RHC done to evaluate PAH (see below). V/Q scan negative. High res CT showed pulmonary fibrosis. Pulmonary consult and recommended stopping amiodarone. She was transitioned to torsemide 60 mg BID. DC weight: 226 lbs.    She has started on selexipag for pulmonary hypertension in addition to Opsumit and Revatio.      Echo in 3/21 showed EF 60-65%, normal RV, PASP 50 mmHg, moderate-severe MR.   Had RHC 08/30/2020- low/normal filling pressures, mild pumonary htn, and preserved cardiac. output.   Admitted to [facility] 1/23 with endocarditis and PNA, requiring pressors. BCx positive for Streptococcus group G.  All her PAH meds were held. TEE showed a large vegetation on the pacer wire in the right atrium above the tricuspid valve (1 x 1.5 cm).  No vegetations noted on the valves.  EF was 55% and there was note of mild to moderate mitral regurgitation. RV ok. She was transferred to [facility] for pacemaker extraction. EP felt she was not a candidate due to multiple comorbidities. ID consulted  and planned for 6 weeks of IV abx. She developed Afib with RVR and started on amio. She underwent successful TEE-guided DCCV 07/14/21. Hospitalization complicated by anemia, hypotension, and AKI.  Midodrine started during admission. She was discharged to SNF, weight 198 lbs.  Admitted 3/23 with lightheadedness. Treated for sinus infection, HF meds held due to othostasis. Midodrine increased to 10 mg tid. HH PT arranged.  Repeat echo in 3/23 showed EF 60-65%, mild LVH, normal RV, IVC normal, no definite vegetation noted.   Follow up 8/23, continued with dyspnea, despite euvolemia. Amiodarone stopped, in case this was causing her symptoms, and arranged for RHC. RHC showed showed normal filling and PA pressures. Felt symptoms pulmonary-driven.  Today she returns for HF follow up with her daughter. She wears CPAP at night, 4 L oxygen at all times.  She has occasional palpitations though in NSR by exam today.  She has chronic left lower leg lymphedema.  She is short of breath walking around the house, walking up any stairs. She has chronic orthopnea, raises the head of her bed.  No chest pain.  Still occasional lightheadedness with standing, takes midodrine.  No falls. She has only been taking torsemide 20 mg daily recently.   6 minute walk (1/20): 98 m 6 minute walk (7/20): 213 m 6 minute walk (3/22): 198 m 6 minute walk (10/22): 229 m   Labs (11/19): digoxin 0.8 Labs (1/20): K 4.2, creatinine 1.36 => 1.33, digoxin 0.8 Labs (2/20): digoxin 0.2, K 4.3, creatinine 1.32 Labs (5/20): K 4.5, creatinine 1.4, digoxin  0.8 Labs (1/22): K 4, creatinine 1.07 Labs (8/22): K 4.1, creatinine 1.03 Labs (1/23): K 3.7, creatinine 1.29 Labs (1/23): K 4.7, creatinine 1.07 Labs (3/23): K 4.3, creatinine 1.24 => 1.07, hgb 9.4 Labs (6/23): BNP 158 Labs (7/23): K 3.9, creatinine 1.1 Labs (10/23): K 3.4, creatinine 1.25, hgb 10 Labs (6/24): K 3.6, creatinine 0.93   PMH: 1. Lymphedema: Left lower leg (chronic).  2.  COPD 3. 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  - PFTs (2017) with FEV1 64%, FEV1/FVC ratio 106%, TLC 49%, DLCO 39% (restrictive).  - CT chest high resolution (10/19): Pulmonary parenchymal pattern of fibrosis.  - CT chest (4/22): Chronic ILD 4. Atrial fibrillation: Paroxysmal.  She was previously off amiodarone due to lung disease.  - 2/19 DCCV.  - 1/23 DCCV 5. H/o DVT s/p IVC filter in 2004.  6. CAD: 4/19 coronary angiography with minimal coronary disease.  7. Bradycardia: [employer] PPM.  8. H/o traumatic SAH 2014.  9. CKD: Stage 3.  10. H/o CVA 11. OSA: CPAP 12. HTN 13. Type II diabetes 14. Pulmonary hypertension:  - PFTs 2017 with severe restriction.  - Echo (10/19): EF 60-65%, PASP 52 mmHg - RHC (4/19): Severe PAH with PVR 7.2 WU.  - RHC (10/19): mean RA 6, PA 56/17 mean 33, PCWP mean 12, CI 3.23, PVR 3.4 WU.  - RHC (3/22): mean 3, RV 30/1, PA 33/11, mean 20, PCWP mean 6, Oxygen saturations: PA 69% AO 100%, Cardiac Output (Fick) 6.18, Cardiac Index (Fick) 3.36, and PVR 2.3 WU - V/Q scan (10/19): No chronic PE.  - Anti-SCL 70 and ANA negative.  ACE normal.  RF slightly elevated at 15.  - Echo (3/21): EF 60-65%, normal RV, PASP 50 mmHg, moderate-severe MR. - TEE (1/23): EF 60-65%, mild RV dilation with normal RV systolic function, MR mild-moderate, moderate TR with vegetation on TV and pacemaker lead - Echo (1/23): EF 60-65%, normal RV, RVSP 51.5 mmHg, mild-moderate MR, moderate TR - Echo (3/23): EF 60-65%, mild LVH, normal RV, IVC normal, no definite vegetation noted.  - RHC (8/23): RA 3, PA 29/10 (mean 16), PCWP 12, CO/CI (Fick) 6.18/3.37 15. 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  16. Mitral regurgitation: moderate to severe on 3/22 echo.  17. Zio patch (3/22): 9.3% PACs, no AF, 2.9% PVCs 18. 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  Deemed too high risk to remove device so have tried to treat through.   19. Fe deficiency anemia 20. B12 deficiency  FH: mother with CHF  Current Outpatient Medications  Medication Sig Dispense Refill   acetaminophen (TYLENOL) 325 MG tablet Take 325-650 mg by mouth every 6 (six) hours as needed for mild pain.     albuterol (ACCUNEB) 0.63 MG/3ML nebulizer solution Take 1 ampule by nebulization every 6 (six) hours as needed for wheezing.     apixaban (ELIQUIS) 5 MG TABS tablet Take 1 tablet (5 mg total) by mouth 2 (two) times daily. 180 tablet 3   atropine 1 % ophthalmic solution Place 1 drop into both eyes at bedtime.     cefadroxil (DURICEF) 500 MG capsule Take 1 capsule by mouth twice daily 60 capsule 0   dapagliflozin propanediol (FARXIGA) 10 MG TABS tablet Take 1 tablet (10 mg total) by mouth daily before breakfast. 90 tablet 6   DULoxetine (CYMBALTA) 60 MG capsule Take 60 mg by mouth daily.     gabapentin (NEURONTIN) 300 MG capsule Take 1 capsule (300 mg  total) by mouth 2 (two) times daily. (Patient taking differently: Take 300 mg by mouth daily.) 60 capsule 0   macitentan (OPSUMIT) 10 MG tablet Take 1 tablet (10 mg total) by mouth daily. 90 tablet 3   meclizine (ANTIVERT) 25 MG tablet Take 1 tablet (25 mg total) by mouth 3 (three) times daily as needed for dizziness. 30 tablet 1   Menthol, Topical Analgesic, (BIOFREEZE EX) Apply 1 application topically 4 (four) times daily as needed (for pain).     midodrine (PROAMATINE) 5 MG tablet Take one tablet at 12 noon, 4pm and 8pm 270 tablet 3   oxyCODONE (OXY IR/ROXICODONE) 5 MG immediate release tablet Take 1 tablet (5 mg total) by mouth every 4 (four) hours as needed for moderate pain, breakthrough pain or severe pain. 12 tablet 0   rosuvastatin (CRESTOR) 10 MG tablet Take 1 tablet by mouth once daily 90 tablet 0   Selexipag (UPTRAVI) 1600 MCG TABS Take 1 tablet (1,600 mcg total) by mouth 2 (two) times daily. 60 tablet 11   sildenafil (REVATIO) 20 MG tablet Take 1 tablet (20 mg total) by mouth 3 (three) times  daily. 90 tablet 3   lidocaine (LIDODERM) 5 % Place 2 patches onto the skin daily. Remove & Discard patch within 12 hours or as directed by MD (Patient not taking: Reported on 12/29/2022) 30 patch 0   pantoprazole (PROTONIX) 40 MG tablet Take 1 tablet (40 mg total) by mouth daily before breakfast for 14 days. 14 tablet 0   polyethylene glycol (MIRALAX / GLYCOLAX) 17 g packet Take 17 g by mouth daily. (Patient not taking: Reported on 12/29/2022) 14 each 0   potassium chloride SA (KLOR-CON M) 20 MEQ tablet Take 2 tablets (40 mEq total) by mouth in the morning AND 1 tablet (20 mEq total) every evening. 90 tablet 6   torsemide (DEMADEX) 20 MG tablet Take 2 tablets (40 mg total) by mouth daily. 60 tablet 6   No current facility-administered medications for this visit.   Allergies:   Aspirin and Other   Social History:  The patient  reports that she quit smoking about 32 years ago. Her smoking use included cigarettes. She has a 0.60 pack-year smoking history. She has been exposed to tobacco smoke. She has never used smokeless tobacco. She reports that she does not drink alcohol and does not use drugs.   Family History:  The patient's family history includes Bone cancer in her sister; Breast cancer in her maternal aunt and sister; COPD in her mother; Heart disease in her mother; Hypertension in her father, mother, sister, and sister; Stroke in her son; Thyroid disease in her mother, sister, and sister.   ROS:  Please see the history of present illness.   All other systems are personally reviewed and negative.   Recent Labs: 01/28/2022: TSH 0.397 04/22/2022: B Natriuretic Peptide 57.8 10/25/2022: Magnesium 2.0 11/18/2022: ALT 15 12/19/2022: BUN 15; Creatinine 0.93; Hemoglobin 9.8; Platelet Count 184; Potassium 3.6; Sodium 140  Personally reviewed   Wt Readings from Last 3 Encounters:  12/29/22 200 lb 3.2 oz (90.8 kg)  12/19/22 199 lb (90.3 kg)  11/28/22 200 lb 9.6 oz (91 kg)    BP (!) 116/55 (BP  Location: Right Arm, Patient Position: Sitting)   Pulse (!) 59   Wt 200 lb 3.2 oz (90.8 kg)   SpO2 98%   BMI 39.10 kg/m   Physical Exam General: NAD Neck: JVP 8-9 cm, no thyromegaly or thyroid nodule.  Lungs: Crackles  at bases.  CV: Nondisplaced PMI.  Heart regular S1/S2, +S4, 2/6 SEM RUSB.  Left leg lymphedema (chronic).  No carotid bruit.  Normal pedal pulses.  Abdomen: Soft, nontender, no hepatosplenomegaly, no distention.  Skin: Intact without lesions or rashes.  Neurologic: Alert and oriented x 3.  Psych: Normal affect. Extremities: No clubbing or cyanosis.  HEENT: Normal.   Assessment & Plan 1. Atrial fibrillation with tachy-brady syndrome:  Had been off amiodarone since 2019 due to interstitial lung disease/pulmonary fibrosis. Amiodarone restarted during 1/23 admission. s/p TEE-guided DCCV 1/23. She is in NSR today by exam.  - She is now off amiodarone.  - Continue Eliquis 5 mg bid.  CBC today.  2. 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 Echo in 3/23 showed EF 60-65%, mild LVH, normal RV, IVC normal, no definite vegetation noted. She is now off midodrine.  Repeat RHC (8/23) showed normal filling and PA pressures. NYHA class IIIb symptoms, she is on a lower dose of torsemide than in the past and is volume overloaded on exam with 4 lb weight gain since the last time she was in the office.  I also suspect that interstitial lung disease plays a large role in her dyspnea.   - Increase torsemide to 40 mg daily and KCl to 40 qam/20 qpm.  BMET/BNP today, BMET in 10 days.  - Start Farxiga 10 mg daily.  - I will arrange for echo.  3. Pulmonary hypertension: Based on 4/19 cath, she had severe PAH with PVR 7.2 WU. Chronic hypoxemic respiratory failure, on home oxygen.  She has a history of remote DVT (2004) but V/Q scan negative for  evidence of chronic PE.  High resolution CT chest in 10/19 showed pulmonary fibrosis, and PFTs in 2017 showed severe restriction.  Serological workup for rheumatological diseases was unremarkable.  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 Amiodarone was stopped in past given lung parenchymal disease. 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 RHC (8/23) showed normal filling and PA pressures. - Continue macitentan 10 daily.  - Continue Selexipag 1600 bid.   - Continue sildenafil 20 mg tid.  - She cannot do a 6 minute walk today due to significant pulmonary limitations.  - Home oxygen, CPAP to continue.  - As above, now off amiodarone due to possibility of pulmonary toxicity.   4. Endocarditis: in 1/23.  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  Group G strep bacteremia.  Septic emboli to lungs. Seen by EP, thought to be too high risk for device extraction. Unfortunately she will be at high risk for complications going forward.  - She is now on lifetime suppression with cefadroxil.  5. Chronic hypoxemic respiratory failure:  At baseline, on 4L home oxygen with CPAP at night for interstitial lung disease and OSA.   - Continue home O2 4 L by [place] (baseline).  - Followup with pulmonary.  6. HTN: Stable today. No medication changes.  7. H/o DVT: Anticoagulated.  8. Left leg lymphedema: Chronic, unchanged.  9. OSA: CPAP.  10. Orthostatic hypotension: Stable on midodrine.   Followup in 6 wks.   Signed, Marca Ancona, MD  12/29/2022  [facility]  [address] [facility] [place] [address] [phone]  [phone] (office) [phone] (fax)

## 2022-12-30 ENCOUNTER — Other Ambulatory Visit: Payer: Self-pay

## 2022-12-30 DIAGNOSIS — I5032 Chronic diastolic (congestive) heart failure: Secondary | ICD-10-CM

## 2023-01-09 ENCOUNTER — Other Ambulatory Visit: Payer: Self-pay | Admitting: Internal Medicine

## 2023-01-19 ENCOUNTER — Inpatient Hospital Stay: Payer: Medicare HMO | Admitting: Internal Medicine

## 2023-01-19 ENCOUNTER — Encounter: Payer: Self-pay | Admitting: Internal Medicine

## 2023-01-19 ENCOUNTER — Inpatient Hospital Stay: Payer: Medicare HMO

## 2023-01-19 ENCOUNTER — Inpatient Hospital Stay: Payer: Medicare HMO | Attending: Internal Medicine

## 2023-01-19 VITALS — BP 109/51 | HR 64 | Temp 97.5°F | Wt 197.5 lb

## 2023-01-19 VITALS — BP 110/57 | HR 59 | Temp 98.0°F | Resp 18

## 2023-01-19 DIAGNOSIS — Z7901 Long term (current) use of anticoagulants: Secondary | ICD-10-CM | POA: Insufficient documentation

## 2023-01-19 DIAGNOSIS — N183 Chronic kidney disease, stage 3 unspecified: Secondary | ICD-10-CM | POA: Diagnosis not present

## 2023-01-19 DIAGNOSIS — E538 Deficiency of other specified B group vitamins: Secondary | ICD-10-CM | POA: Diagnosis not present

## 2023-01-19 DIAGNOSIS — D631 Anemia in chronic kidney disease: Secondary | ICD-10-CM | POA: Diagnosis not present

## 2023-01-19 DIAGNOSIS — D649 Anemia, unspecified: Secondary | ICD-10-CM

## 2023-01-19 DIAGNOSIS — E611 Iron deficiency: Secondary | ICD-10-CM

## 2023-01-19 DIAGNOSIS — R5383 Other fatigue: Secondary | ICD-10-CM | POA: Diagnosis not present

## 2023-01-19 DIAGNOSIS — E876 Hypokalemia: Secondary | ICD-10-CM | POA: Insufficient documentation

## 2023-01-19 DIAGNOSIS — N289 Disorder of kidney and ureter, unspecified: Secondary | ICD-10-CM

## 2023-01-19 DIAGNOSIS — Z86718 Personal history of other venous thrombosis and embolism: Secondary | ICD-10-CM | POA: Insufficient documentation

## 2023-01-19 DIAGNOSIS — Z87891 Personal history of nicotine dependence: Secondary | ICD-10-CM | POA: Insufficient documentation

## 2023-01-19 LAB — CBC WITH DIFFERENTIAL (CANCER CENTER ONLY)
Abs Immature Granulocytes: 0.05 10*3/uL (ref 0.00–0.07)
Basophils Absolute: 0 10*3/uL (ref 0.0–0.1)
Basophils Relative: 0 %
Eosinophils Absolute: 0.2 10*3/uL (ref 0.0–0.5)
Eosinophils Relative: 3 %
HCT: 34.8 % — ABNORMAL LOW (ref 36.0–46.0)
Hemoglobin: 10.3 g/dL — ABNORMAL LOW (ref 12.0–15.0)
Immature Granulocytes: 1 %
Lymphocytes Relative: 21 %
Lymphs Abs: 1.1 10*3/uL (ref 0.7–4.0)
MCH: 27.1 pg (ref 26.0–34.0)
MCHC: 29.6 g/dL — ABNORMAL LOW (ref 30.0–36.0)
MCV: 91.6 fL (ref 80.0–100.0)
Monocytes Absolute: 0.3 10*3/uL (ref 0.1–1.0)
Monocytes Relative: 6 %
Neutro Abs: 3.8 10*3/uL (ref 1.7–7.7)
Neutrophils Relative %: 69 %
Platelet Count: 174 10*3/uL (ref 150–400)
RBC: 3.8 MIL/uL — ABNORMAL LOW (ref 3.87–5.11)
RDW: 12.1 % (ref 11.5–15.5)
WBC Count: 5.5 10*3/uL (ref 4.0–10.5)
nRBC: 0 % (ref 0.0–0.2)

## 2023-01-19 LAB — BASIC METABOLIC PANEL - CANCER CENTER ONLY
Anion gap: 8 (ref 5–15)
BUN: 34 mg/dL — ABNORMAL HIGH (ref 8–23)
CO2: 28 mmol/L (ref 22–32)
Calcium: 9 mg/dL (ref 8.9–10.3)
Chloride: 103 mmol/L (ref 98–111)
Creatinine: 1.25 mg/dL — ABNORMAL HIGH (ref 0.44–1.00)
GFR, Estimated: 44 mL/min — ABNORMAL LOW (ref >60)
Glucose, Bld: 109 mg/dL — ABNORMAL HIGH (ref 70–99)
Potassium: 4.6 mmol/L (ref 3.5–5.1)
Sodium: 139 mmol/L (ref 135–145)

## 2023-01-19 LAB — IRON AND TIBC
Iron: 61 ug/dL (ref 28–170)
Saturation Ratios: 17 % (ref 10.4–31.8)
TIBC: 364 ug/dL (ref 250–450)
UIBC: 303 ug/dL

## 2023-01-19 LAB — VITAMIN B12: Vitamin B-12: 233 pg/mL (ref 180–914)

## 2023-01-19 LAB — FERRITIN: Ferritin: 628 ng/mL — ABNORMAL HIGH (ref 11–307)

## 2023-01-19 MED ORDER — SODIUM CHLORIDE 0.9 % IV SOLN
Freq: Once | INTRAVENOUS | Status: AC
  Filled 2023-01-19: qty 250

## 2023-01-19 MED ORDER — CYANOCOBALAMIN 1000 MCG/ML IJ SOLN
1000.0000 ug | Freq: Once | INTRAMUSCULAR | Status: AC
  Administered 2023-01-19: 1000 ug via INTRAMUSCULAR
  Filled 2023-01-19: qty 1

## 2023-01-19 MED ORDER — SODIUM CHLORIDE 0.9 % IV SOLN
200.0000 mg | Freq: Once | INTRAVENOUS | Status: AC
  Administered 2023-01-19: 200 mg via INTRAVENOUS
  Filled 2023-01-19: qty 10

## 2023-01-19 NOTE — Progress Notes (Signed)
Debra [facility]unders CONSULT NOTE  Patient Care Team: Glori Luis, MD as PCP - General (Family Medicine) Iran Ouch, MD as PCP - Cardiology (Cardiology) [facility] Salvia, MD as PCP - Electrophysiology (Cardiology) Carolee Rota, MD as Referring Physician (Allergy) Lupita Leash, MD (Inactive) as Referring Physician (Internal Medicine) Earna Coder, MD as Consulting Physician (Hematology and Oncology) Otho Ket, RN as [facility] Care Management Anemia CHIEF COMPLAINTS/PURPOSE OF CONSULTATION: Anemia   HEMATOLOGY HISTORY  # CHORNIC IRON DEFICIENCY ANEMIA [BL 10-11]-Jan today 21 PCP Hb-8.8; Maralyn Sago sat-5%; ferritin 11] EGD-; colonoscopy MARCH 2020 -polyp [[facility]-hold further work-up secondary to cardiac issues]; remote-EGD-?;  February 2021-LDH/haptoglobin normal; kappa lambda light chain ratio/M panel normal;  on Venofer; November 2020-CT scan question early cirrhosis/no splenomegaly.  No acute process; MARCH 2021- STOOL OCCULT x3- NEGATIVE.   #B12 deficiency-IM every 3 to 4 months  #2014 -question blood transfusion  [patient admitted for stroke]; CHF chronic -[ 2004 on 4 lits; Dr.Arida/Klein]; CT scan chest 2021 -no adenopathy; fibrotic changes; walker; CKD stage III-   #Incidental right axillary mass-s/p US biopsy [PCP] -negative for malignancy.  HISTORY OF PRESENTING ILLNESS: Patient in wheelchair on [place] O2.  Alone.  Debra Saunders 77 y.o.  female multiple comorbidities including COPD/chronic typically failure on oxygen and anemia/likely secondary to ?CKD stage III -as needed IV iron is here for follow-up.   Patient says that she can't sleep, she goes to bed at 10 but can't get to sleep until about 4. She says that she is so tired.   No blood in stools ; no nausea vomiting.  Continues to be compliant with her Eliquis.  Patient continues with oxygen.    Review of Systems  Constitutional:  Positive for malaise/fatigue. Negative for  chills, diaphoresis, fever and weight loss.  HENT:  Negative for nosebleeds and sore throat.   Eyes:  Negative for double vision.  Respiratory:  Positive for shortness of breath. Negative for cough, hemoptysis, sputum production and wheezing.   Cardiovascular:  Negative for chest pain, palpitations, orthopnea and leg swelling.  Gastrointestinal:  Negative for abdominal pain, blood in stool, constipation, heartburn, melena, nausea and vomiting.  Genitourinary:  Negative for dysuria, frequency and urgency.  Musculoskeletal:  Negative for back pain and joint pain.  Skin: Negative.  Negative for itching and rash.  Neurological:  Negative for dizziness, tingling, focal weakness, weakness and headaches.  Endo/Heme/Allergies:  Does not bruise/bleed easily.  Psychiatric/Behavioral:  Negative for depression. The patient is not nervous/anxious and does not have insomnia.     MEDICAL HISTORY:  Past Medical History:  Diagnosis Date   Arthritis    Atrial fibrillation, persistent (HCC)    a. s/p TEE-guided DCCV on 08/20/2017; b. 06/2021 Recurrent Afib-->amio/TEE/DCCV.   CAD (coronary artery disease)    a. 01/2012 Cath: nonobs dzs; b. 04/2014 MV: No ischemia; c. 09/2017 Cath: Minimal nonobs RCA dzs, otw nl cors.   Cardiorenal syndrome    a. 09/2017 Creat rose to 2.7 w/ diuresis-->HD x 2.   Chronic back pain    Chronic combined systolic (congestive) and diastolic (congestive) heart failure (HCC)    a. Dx 2005 @ [facility];  b. 02/2014 Echo: EF 55-60%; c. 07/2017: EF 30-35%; d. 09/2017 Echo: EF 40-45%; e. 02/2018 Echo: EF 55-60%; f. 08/2019 Echo: EF 60-65%; g. 08/2020 RHC: RA 3, RV 30/1, PA 33/11 (20), PCWP 6; f. 08/2021 Echo: EF 60-65%, no rwma, GrIDD, nl RV fxn, RVSP 40.68mmHg, mod dil LA,  mod TR.   Chronic respiratory failure (HCC)    a. on home O2   Gastritis    History of COVID-19    History of endocarditis 08/22/2021   History of intracranial hemorrhage    a. 6/14 described by neurosurgery as "small frontal  posttraumatic SAH " - pt was on xarelto @ the time.   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 Chronic LLE edema.   NICM (nonischemic cardiomyopathy) (HCC)    a. 09/2017 Echo: EF 40-45%; b. 09/2017 Cath: minor, insignificant CAD; c. 02/2018 Echo: EF 55-60%, Gr1 DD; d. 08/2019 Echo: EF 60-65%, Gr2 DD.   Obesity    PAH (pulmonary artery hypertension) (HCC)    a. 09/2017 Echo: PASP ; b. 09/2017 RHC: RA 23, RV 84/13, PA 84/29, PCWP 20, CO 3.89, CI 1.89. LVEDP 18; c. 08/2020 RHC: RA 3, RV 30/1, PA 33/11 (20), PCWP 6. CO/CI 6.18/3.36. PVR 2.3 WU.   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 06/2021. Veg on PPM lead. Not candidate for extraction-->s/p 6 wks abx.   Sepsis with metabolic encephalopathy (HCC)    Sleep apnea    Streptococcal bacteremia    Stroke [facility]) 2014   Subarachnoid hemorrhage (HCC) 01/19/2013   Symptomatic bradycardia    a. s/p BSX PPM.   Thyroid nodule    Type II diabetes mellitus (HCC)    Urinary incontinence     SURGICAL HISTORY: Past Surgical History:  Procedure Laterality Date   ABDOMINAL HYSTERECTOMY  1969   BREAST BIOPSY Right 10/28/2019   lymph node bx, [place] Bx, pending path    CARDIAC CATHETERIZATION  2013   @ [facility]: No obstructive CAD: Only 20% ostial left circumflex.   CARDIOVERSION N/A 10/12/2017   Procedure: CARDIOVERSION;  Surgeon: Antonieta Iba, MD;  Location: [facility] ORS;  Service: Cardiovascular;  Laterality: N/A;   CARDIOVERSION N/A 07/04/2021   Procedure: CARDIOVERSION;  Surgeon: Laurey Morale, MD;  Location: [facility] END[facility];  Service: Cardiovascular;  Laterality: N/A;   CATARACT EXTRACTION W/PHACO Right 09/19/2020   Procedure: CATARACT EXTRACTION PHACO AND INTRAOCULAR LENS PLACEMENT (IOC) RIGHT DIABETIC 3.95 00:43.0 9.2%;  Surgeon: Lockie Mola, MD;  Location: [facility];  Service: Ophthalmology;  Laterality: Right;  Pt requests to be last sleep apnea   CATARACT EXTRACTION W/PHACO Left 10/03/2020   Procedure: CATARACT EXTRACTION PHACO AND INTRAOCULAR LENS PLACEMENT (IOC) LEFT DIABETIC;  Surgeon: Lockie Mola, MD;  Location: [facility];  Service: Ophthalmology;  Laterality: Left;  3.25 0:59.3 5.5%   CHOLECYSTECTOMY     COLONOSCOPY WITH PROPOFOL N/A 09/10/2018   Procedure: COLONOSCOPY WITH PROPOFOL;  Surgeon: Scot Jun, MD;  Location: [facility] ENDOSCOPY;  Service: Endoscopy;  Laterality: N/A;   COLONOSCOPY WITH PROPOFOL N/A 10/22/2022   Procedure: COLONOSCOPY WITH PROPOFOL;  Surgeon: Jaynie Collins, DO;  Location: [facility] ENDOSCOPY;  Service: Gastroenterology;  Laterality: N/A;   CYST REMOVAL TRUNK  2002   BACK   DIALYSIS/PERMA CATHETER INSERTION N/A 10/06/2017   Procedure: DIALYSIS/PERMA CATHETER INSERTION;  Surgeon: Renford Dills, MD;  Location: [facility] INVASIVE CV LAB;  Service: Cardiovascular;  Laterality: N/A;   ESOPHAGOGASTRODUODENOSCOPY (EGD) WITH PROPOFOL N/A 10/21/2022   Procedure: ESOPHAGOGASTRODUODENOSCOPY (EGD) WITH PROPOFOL;  Surgeon: Jaynie Collins, DO;  Location: [facility] ENDOSCOPY;  Service: Gastroenterology;  Laterality:  N/A;   GIVENS CAPSULE STUDY N/A 10/22/2022   Procedure: GIVENS CAPSULE STUDY;  Surgeon: Jaynie Collins, DO;  Location: [facility] ENDOSCOPY;  Service: Gastroenterology;  Laterality: N/A;  IF NOTHING IS FOUND WITH COLONOSCOPY   INSERT / REPLACE / REMOVE PACEMAKER     lipoma removal  2014   back   PACEMAKER INSERTION  2015   [employer] dual chamber pacemaker implanted by Dr Graciela Husbands for symptomatic bradycardia   PERMANENT PACEMAKER INSERTION N/A 01/09/2014   Procedure: PERMANENT PACEMAKER INSERTION;  Surgeon: [facility] Salvia, MD;  Location: [facility] CATH LAB;  Service: Cardiovascular;  Laterality: N/A;   RIGHT HEART CATH N/A 04/19/2018   Procedure: RIGHT HEART CATH;  Surgeon: Laurey Morale,  MD;  Location: [facility] INVAS[facility] CV LAB;  Service: Cardiovascular;  Laterality: N/A;   RIGHT HEART CATH N/A 08/30/2020   Procedure: RIGHT HEART CATH;  Surgeon: Laurey Morale, MD;  Location: [facility] CV [facility];  Service: Cardiovascular;  Laterality: N/A;   RIGHT HEART CATH N/A 02/11/2022   Procedure: RIGHT HEART CATH;  Surgeon: Laurey Morale, MD;  Location: [facility] INV[facility];  Service: Cardiovascular;  Laterality: N/A;   RIGHT/LEFT HEART CATH AND CORONARY ANGIOGRAPHY N/A 10/19/2017   Procedure: RIGHT/LEFT HEART CATH AND CORONARY ANGIOGRAPHY;  Surgeon: Iran Ouch, MD;  Location: [facility] INVASIVE CV LAB;  Service: Cardiovascular;  Laterality: N/A;   TEE WITHOUT CARDIOVERSION N/A 08/20/2017   Procedure: TRANSESOPHAGEAL ECHOCARDIOGRAM (TEE) & Direct current cardioversion;  Surgeon: Antonieta Iba, MD;  Location: [facility] ORS;  Service: Cardiovascular;  Laterality: N/A;   TEE WITHOUT CARDIOVERSION N/A 06/28/2021   Procedure: TRANSESOPHAGEAL ECHOCARDIOGRAM (TEE);  Surgeon: Antonieta Iba, MD;  Location: [facility] ORS;  Service: Cardiovascular;  Laterality: N/A;   TEE WITHOUT CARDIOVERSION N/A 07/04/2021   Procedure: TRANSESOPHAGEAL ECHOCARDIOGRAM (TEE);  Surgeon: Laurey Morale, MD;  Location: [facility] ENDOSCOPY;  Service: Cardiovascular;  Laterality: N/A;   TRIGGER FINGER RELEASE      SOCIAL HISTORY: Social History   Socioeconomic History   Marital status: Divorced    Spouse name: Not on file   Number of children: 4   Years of education: 12   Highest education level: High school graduate  Occupational History   Occupation: Retired- factory work    Comment: exposed to Interior and spatial designer  Tobacco Use   Smoking status: Former    Current packs/day: 0.00    Average packs/day: 0.3 packs/day for 2.0 years (0.6 ttl pk-yrs)    Types: Cigarettes    Start date: 06/23/1988    Quit date: 06/23/1990    Years since quitting: 32.5    Passive exposure: Past   Smokeless tobacco: Never  Vaping Use   Vaping status: Never  Used  Substance and Sexual Activity   Alcohol use: No   Drug use: No   Sexual activity: Not Currently    Birth control/protection: Surgical  Other Topics Concern   Not on file  Social History Narrative   Lives in [place] with daughter. Has 4 children. No pets.      Work - Hotel manager, retired      Diet - regular      02/14/2019: reports has Meals On Wheels delivered to her home;       Remote hx of smoking > 30 years; No alcohol-    Social Determinants of Health   Financial Resource Strain: Low Risk  (02/19/2022)   Overall Financial Resource Strain (CARDIA)    Difficulty of Paying Living Expenses: Not  hard at all  Food Insecurity: No Food Insecurity (10/20/2022)   Hunger Vital Sign    Worried About Running Out of Food in the Last Year: Never true    Ran Out of Food in the Last Year: Never true  Transportation Needs: Unmet Transportation Needs (11/18/2022)   PRAPARE - Transportation    Lack of Transportation (Medical): Yes    Lack of Transportation (Non-Medical): Yes  Physical Activity: Inactive (01/05/2018)   Exercise Vital Sign    Days of Exercise per Week: 0 days    Minutes of Exercise per Session: 0 min  Stress: No Stress Concern Present (02/19/2022)   [employer] of Occupational Health - Occupational Stress Questionnaire    Feeling of Stress : Not at all  Social Connections: Socially Isolated (02/19/2022)   Social Connection and Isolation Panel [NHANES]    Frequency of Communication with Friends and Family: More than three times a week    Frequency of Social Gatherings with Friends and Family: Once a week    Attends Religious Services: Never    Database administrator or Organizations: No    Attends Banker Meetings: Never    Marital Status: Divorced  Catering manager Violence: Not At Risk (10/20/2022)   Humiliation, Afraid, Rape, and Kick questionnaire    Fear of Current or Ex-Partner: No    Emotionally Abused: No    Physically Abused: No     Sexually Abused: No    FAMILY HISTORY: Family History  Problem Relation Age of Onset   Heart disease Mother    Hypertension Mother    COPD Mother        was a smoker   Thyroid disease Mother    Hypertension Father    Hypertension Sister    Thyroid disease Sister    Breast cancer Sister    Hypertension Sister    Thyroid disease Sister    Bone cancer Sister    Stroke Son    Breast cancer Maternal Aunt    Kidney disease Neg Hx    Bladder Cancer Neg Hx     ALLERGIES:  is allergic to aspirin and other.  MEDICATIONS:  Current Outpatient Medications  Medication Sig Dispense Refill   acetaminophen (TYLENOL) 325 MG tablet Take 325-650 mg by mouth every 6 (six) hours as needed for mild pain.     albuterol (ACCUNEB) 0.63 MG/3ML nebulizer solution Take 1 ampule by nebulization every 6 (six) hours as needed for wheezing.     apixaban (ELIQUIS) 5 MG TABS tablet Take 1 tablet (5 mg total) by mouth 2 (two) times daily. 180 tablet 3   atropine 1 % ophthalmic solution Place 1 drop into both eyes at bedtime.     cefadroxil (DURICEF) 500 MG capsule TAKE 1 CAPSULE BY MOUTH TWICE DAILY .PATIENT  NEEDS  APPOINTMENT 60 capsule 0   dapagliflozin propanediol (FARXIGA) 10 MG TABS tablet Take 1 tablet (10 mg total) by mouth daily before breakfast. 90 tablet 6   DULoxetine (CYMBALTA) 60 MG capsule Take 60 mg by mouth daily.     gabapentin (NEURONTIN) 300 MG capsule Take 1 capsule (300 mg total) by mouth 2 (two) times daily. (Patient taking differently: Take 300 mg by mouth daily.) 60 capsule 0   macitentan (OPSUMIT) 10 MG tablet Take 1 tablet (10 mg total) by mouth daily. 90 tablet 3   meclizine (ANTIVERT) 25 MG tablet Take 1 tablet (25 mg total) by mouth 3 (three) times daily as needed for dizziness. 30  tablet 1   Menthol, Topical Analgesic, (BIOFREEZE EX) Apply 1 application topically 4 (four) times daily as needed (for pain).     midodrine (PROAMATINE) 5 MG tablet Take one tablet at 12 noon, 4pm and  8pm 270 tablet 3   oxyCODONE (OXY IR/ROXICODONE) 5 MG immediate release tablet Take 1 tablet (5 mg total) by mouth every 4 (four) hours as needed for moderate pain, breakthrough pain or severe pain. 12 tablet 0   potassium chloride SA (KLOR-CON M) 20 MEQ tablet Take 2 tablets (40 mEq total) by mouth in the morning AND 1 tablet (20 mEq total) every evening. 90 tablet 6   rosuvastatin (CRESTOR) 10 MG tablet Take 1 tablet by mouth once daily 90 tablet 0   Selexipag (UPTRAVI) 1600 MCG TABS Take 1 tablet (1,600 mcg total) by mouth 2 (two) times daily. 60 tablet 11   sildenafil (REVATIO) 20 MG tablet Take 1 tablet (20 mg total) by mouth 3 (three) times daily. 90 tablet 3   torsemide (DEMADEX) 20 MG tablet Take 2 tablets (40 mg total) by mouth daily. 60 tablet 6   lidocaine (LIDODERM) 5 % Place 2 patches onto the skin daily. Remove & Discard patch within 12 hours or as directed by MD (Patient not taking: Reported on 01/19/2023) 30 patch 0   pantoprazole (PROTONIX) 40 MG tablet Take 1 tablet (40 mg total) by mouth daily before breakfast for 14 days. 14 tablet 0   polyethylene glycol (MIRALAX / GLYCOLAX) 17 g packet Take 17 g by mouth daily. (Patient not taking: Reported on 12/29/2022) 14 each 0   No current facility-administered medications for this visit.      PHYSICAL EXAMINATION:   Vitals:   01/19/23 1452  BP: (!) 109/51  Pulse: 64  Temp: (!) 97.5 F (36.4 C)  SpO2: 99%    Filed Weights   01/19/23 1452  Weight: 197 lb 8 oz (89.6 kg)     Physical Exam Constitutional:      Comments: Patient in wheelchair.  Alone.  She is on O2 nasal cannula.  HENT:     Head: Normocephalic and atraumatic.     Mouth/Throat:     Pharynx: No oropharyngeal exudate.  Eyes:     Pupils: Pupils are equal, round, and reactive to light.  Cardiovascular:     Rate and Rhythm: Normal rate and regular rhythm.  Pulmonary:     Effort: No respiratory distress.     Breath sounds: No wheezing.     Comments:  Decreased air entry bilaterally.  No wheeze or crackles. Abdominal:     General: Bowel sounds are normal. There is no distension.     Palpations: Abdomen is soft. There is no mass.     Tenderness: There is no abdominal tenderness. There is no guarding or rebound.  Musculoskeletal:        General: No tenderness. Normal range of motion.     Cervical back: Normal range of motion and neck supple.  Skin:    General: Skin is warm.  Neurological:     Mental Status: She is alert and oriented to person, place, and time.  Psychiatric:        Mood and Affect: Affect normal.     LABORATORY DATA:  I have reviewed the data as listed Lab Results  Component Value Date   WBC 5.5 01/19/2023   HGB 10.3 (L) 01/19/2023   HCT 34.8 (L) 01/19/2023   MCV 91.6 01/19/2023   PLT 174 01/19/2023  Recent Labs    10/20/22 0423 10/21/22 0404 10/22/22 0143 10/26/22 0554 11/04/22 1144 11/18/22 1302 12/19/22 1236  NA 141 138   < > 134* 142 142 140  K 2.9* 3.9   < > 3.6 4.3 2.9* 3.6  CL 101 101   < > 102 107 104 102  CO2 32 29   < > 24 28 28 29   GLUCOSE 107* 120*   < > 129* 95 114* 108*  BUN 23 19   < > 32* 15 17 15   CREATININE 0.96 0.94   < > 1.26* 0.96 0.85 0.93  CALCIUM 8.7* 9.0   < > 8.4* 8.8 8.5* 8.9  GFRNONAA >60 >60   < > 43*  --  >60 >60  PROT 6.6 6.7  --   --   --  6.8  --   ALBUMIN 3.8 3.7  --   --   --  3.6  --   AST 14* 16  --   --   --  18  --   ALT 12 13  --   --   --  15  --   ALKPHOS 98 102  --   --   --  97  --   BILITOT 0.8 0.6  --   --   --  0.4  --   BILIDIR  --  0.1  --   --   --   --   --   IBILI  --  0.5  --   --   --   --   --    < > = values in this interval not displayed.     No results found.   Symptomatic anemia # Chronic Anemia [BL10-11]-iron deficiency/? CKD stage III  currently s/p Venofer.   # [DEC 2023- ferritin 382; iron sat 18%]; continue gentle iron 1 pill a day.    Proceed with venofer today. Also discussed the possible role of Retacrit with patient's  hemoglobin does not improve.  S/p EGD/ capsule MAY 2024- NEG [hospital]; [facility] [Dr.Russo]- [place] 2024 - NEG.  # Etiology of anemia-unclear- CKD stage III  vs Others-[Dr.Lateef]/ [chronic diarrhea/malabsorption]-monitor closely.  If anemia is worse consider bone marrow biopsy.  # Mild Hypokalemia- K 2.8-  recommend re-start Kdur BID; refilled script   #History of DVT/s/p IVC filter; ?  A. Fib-Eliquis- stable.   #Borderline low B12-OCT 2023- 230; s/p B12 injection.; proceed with injection q 2- 4 months.    # DISPOSITION: # proceed  b12 today; and venofer today; no retacrit # Follow up in 1 months-APP-  labs-- cbc;bmp; b12 levels; iron studies; ferritin--possible Venofer OR retacrit;  B12 injection- Dr.B  Cc; Harmon Dun   All questions were answered. The patient knows to call the clinic with any problems, questions or concerns.    Earna Coder, MD 01/19/2023 3:01 PM

## 2023-01-19 NOTE — Progress Notes (Signed)
Patient says that she can't sleep, she goes to bed at 10 but can't get to sleep until about 4. She says that she is so tired.

## 2023-01-19 NOTE — Assessment & Plan Note (Addendum)
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 CKD stage III  currently s/p Venofer.   # [DEC 2023- ferritin 382; iron sat 18%]; continue gentle iron 1 pill a day.    Proceed with venofer today. Also discussed the possible role of Retacrit with patient's hemoglobin does not improve.  S/p EGD/ capsule MAY 2024- NEG [hospital]; [facility] [Dr.Russo]- MAY 2024 - NEG.  # Etiology of anemia-unclear- CKD stage III  vs Others-[Dr.Lateef]/ [chronic diarrhea/malabsorption]-monitor closely.  If anemia is worse consider bone marrow biopsy.  # Mild Hypokalemia- K 2.8-  recommend re-start Kdur BID; refilled script   #History of DVT/s/p IVC filter; ?  A. Fib-Eliquis- stable.   #Borderline low B12-OCT 2023- 230; s/p B12 injection.; proceed with injection q 2- 4 months.    # DISPOSITION: # proceed  b12 today; and venofer today; no retacrit # Follow up in 69months-MD -  labs-- cbc;bmp; b12 levels; iron studies; ferritin--possible Venofer OR retacrit;  B12 injection- Dr.B  Cc; Harmon Dun

## 2023-02-02 ENCOUNTER — Ambulatory Visit: Payer: Medicare HMO | Admitting: Cardiology

## 2023-02-02 NOTE — Progress Notes (Unsigned)
Cardiology Office Note Date:  02/03/2023  Patient ID:  Debra Saunders, Debra Saunders [birth year], MRN [id number] PCP:  Glori Luis, MD  Cardiologist:  [name], MD HF cardiologist: Marca Ancona, MD Electrophysiologist: Sherryl Manges, MD    Chief Complaint: 6mon follow-up  History of Present Illness: Debra Saunders is a 77 y.o. 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  She last saw Dr. Graciela Husbands 06/2022, she had recently stopped midodrine and was having more dizziness. He resumed it at noon, 1600 and 2000 (patient goes to bed around 3am).   She had appt with Adv HF 12/30/22, off midodrine. Fluid overloaded so increased torsemide 20mg  > 40mg  and started farxiga.   On followup today, she is having costochondritis at central chest. In the past, she takes tylenol for this pain but is not taking scheduled tylenol at this time. She denies worsening SOB, jaw pain, arm pain/numbness. Her chest pain feels exactly like her costochondritis episodes in the past. Her chest pain only hurts in central chest with movement or deep inspirations.  She denies palpitations, N/V, dizziness, syncope. She has chronic LLE edema d/t DVT. RLE is not edematous.    Device Information: Bos Sci dual chamber PPM, imp 12/2013; SND MDT leads   AAD History: Amiodarone, stopped d/t concerns for ILD/pulm fibrosis  Past Medical History:  Diagnosis Date   Arthritis    Atrial fibrillation, persistent (HCC)    a. s/p TEE-guided DCCV on 08/20/2017; b. 06/2021 Recurrent Afib-->amio/TEE/DCCV.   CAD (coronary artery disease)    a. 01/2012 Cath: nonobs dzs; b. 04/2014 MV: No ischemia; c. 09/2017 Cath: Minimal nonobs RCA dzs, otw nl cors.   Cardiorenal syndrome    a. 09/2017 Creat rose to 2.7 w/ diuresis-->HD x 2.   Chronic back pain    Chronic combined  systolic (congestive) and diastolic (congestive) heart failure (HCC)    a. Dx 2005 @ [facility];  b. 02/2014 Echo: EF 55-60%; c. 07/2017: EF 30-35%; d. 09/2017 Echo: EF 40-45%; e. 02/2018 Echo: EF 55-60%; f. 08/2019 Echo: EF 60-65%; g. 08/2020 RHC: RA 3, RV 30/1, PA 33/11 (20), PCWP 6; f. 08/2021 Echo: EF 60-65%, no rwma, GrIDD, nl RV fxn, RVSP 40.76mmHg, mod dil LA, mod TR.   Chronic respiratory failure (HCC)    a. on home O2   Gastritis    History of COVID-19    History of endocarditis 08/22/2021   History of intracranial hemorrhage    a. 6/14 described by neurosurgery as "small frontal posttraumatic SAH " - pt was on xarelto @ the time.   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 Chronic LLE edema.   NICM (nonischemic cardiomyopathy) (HCC)    a. 09/2017 Echo: EF 40-45%; b. 09/2017 Cath: minor, insignificant CAD; c. 02/2018 Echo: EF 55-60%, Gr1 DD; d. 08/2019 Echo: EF 60-65%, Gr2 DD.   Obesity    PAH (pulmonary artery hypertension) (HCC)    a. 09/2017 Echo: PASP ; b. 09/2017 RHC: RA 23, RV 84/13, PA 84/29, PCWP 20, CO 3.89, CI 1.89. LVEDP 18; c. 08/2020 RHC: RA 3, RV 30/1, PA 33/11 (20), PCWP 6. CO/CI 6.18/3.36. PVR 2.3 WU.   Presence of permanent cardiac pacemaker  01/09/2014   SBE (subacute bacterial endocarditis)    a. 06/2021. Veg on PPM lead. Not candidate for extraction-->s/p 6 wks abx.   Sepsis with metabolic encephalopathy (HCC)    Sleep apnea    Streptococcal bacteremia    Stroke [facility]) 2014   Subarachnoid hemorrhage (HCC) 01/19/2013   Symptomatic bradycardia    a. s/p BSX PPM.   Thyroid nodule    Type II diabetes mellitus (HCC)    Urinary incontinence     Past Surgical History:  Procedure Laterality Date   ABDOMINAL HYSTERECTOMY  1969   BREAST BIOPSY Right 10/28/2019   lymph node bx, [place] Bx, pending path    CARDIAC CATHETERIZATION  2013   @ [facility]: No  obstructive CAD: Only 20% ostial left circumflex.   CARDIOVERSION N/A 10/12/2017   Procedure: CARDIOVERSION;  Surgeon: Antonieta Iba, MD;  Location: [facility] ORS;  Service: Cardiovascular;  Laterality: N/A;   CARDIOVERSION N/A 07/04/2021   Procedure: CARDIOVERSION;  Surgeon: Laurey Morale, MD;  Location: [facility] ENDOSCOPY;  Service: Cardiovascular;  Laterality: N/A;   CATARACT EXTRACTION W/PHACO Right 09/19/2020   Procedure: CATARACT EXTRACTION PHACO AND INTRAOCULAR LENS PLACEMENT (IOC) RIGHT DIABETIC 3.95 00:43.0 9.2%;  Surgeon: Lockie Mola, MD;  Location: [facility] SURGERY CNTR;  Service: Ophthalmology;  Laterality: Right;  Pt requests to be last sleep apnea   CATARACT EXTRACTION W/PHACO Left 10/03/2020   Procedure: CATARACT EXTRACTION PHACO AND INTRAOCULAR LENS PLACEMENT (IOC) LEFT DIABETIC;  Surgeon: Lockie Mola, MD;  Location: [facility] SURGERY CNTR;  Service: Ophthalmology;  Laterality: Left;  3.25 0:59.3 5.5%   CHOLECYSTECTOMY     COLONOSCOPY WITH PROPOFOL N/A 09/10/2018   Procedure: COLONOSCOPY WITH PROPOFOL;  Surgeon: Scot Jun, MD;  Location: [facility] ENDOSCOPY;  Service: Endoscopy;  Laterality: N/A;   COLONOSCOPY WITH PROPOFOL N/A 10/22/2022   Procedure: COLONOSCOPY WITH PROPOFOL;  Surgeon: Jaynie Collins, DO;  Location: [facility] ENDOSCOPY;  Service: Gastroenterology;  Laterality: N/A;   CYST REMOVAL TRUNK  2002   BACK   DIALYSIS/PERMA CATHETER INSERTION N/A 10/06/2017   Procedure: DIALYSIS/PERMA CATHETER INSERTION;  Surgeon: Renford Dills, MD;  Location: [facility] INVASIVE CV LAB;  Service: Cardiovascular;  Laterality: N/A;   ESOPHAGOGASTRODUODENOSCOPY (EGD) WITH PROPOFOL N/A 10/21/2022   Procedure: ESOPHAGOGASTRODUODENOSCOPY (EGD) WITH PROPOFOL;  Surgeon: Jaynie Collins, DO;  Location: [facility] ENDOSCOPY;  Service: Gastroenterology;  Laterality: N/A;   GIVENS CAPSULE STUDY N/A 10/22/2022   Procedure: GIVENS CAPSULE STUDY;  Surgeon: Jaynie Collins, DO;  Location:  [facility] ENDOSCOPY;  Service: Gastroenterology;  Laterality: N/A;  IF NOTHING IS FOUND WITH COLONOSCOPY   INSERT / REPLACE / REMOVE PACEMAKER     lipoma removal  2014   back   PACEMAKER INSERTION  2015   [employer] dual chamber pacemaker implanted by Dr Graciela Husbands for symptomatic bradycardia   PERMANENT PACEMAKER INSERTION N/A 01/09/2014   Procedure: PERMANENT PACEMAKER INSERTION;  Surgeon: [place] Salvia, MD;  Location: [facility] CATH LAB;  Service: Cardiovascular;  Laterality: N/A;   RIGHT HEART CATH N/A 04/19/2018   Procedure: RIGHT HEART CATH;  Surgeon: Laurey Morale, MD;  Location: [facility] INVASIVE CV LAB;  Service: Cardiovascular;  Laterality: N/A;   RIGHT HEART CATH N/A 08/30/2020   Procedure: RIGHT HEART CATH;  Surgeon: Laurey Morale, MD;  Location: [facility] INVASIVE CV LAB;  Service: Cardiovascular;  Laterality: N/A;   RIGHT HEART CATH N/A 02/11/2022   Procedure: RIGHT HEART CATH;  Surgeon: Laurey Morale, MD;  Location: [facility] INVASIVE CV LAB;  Service: Cardiovascular;  Laterality: N/A;   RIGHT/LEFT HEART CATH AND CORONARY ANGIOGRAPHY N/A 10/19/2017   Procedure: RIGHT/LEFT HEART CATH AND CORONARY ANGIOGRAPHY;  Surgeon: Iran Ouch, MD;  Location: [facility] INVASIVE CV LAB;  Service: Cardiovascular;  Laterality: N/A;   TEE WITHOUT CARDIOVERSION N/A 08/20/2017   Procedure: TRANSESOPHAGEAL ECHOCARDIOGRAM (TEE) & Direct current cardioversion;  Surgeon: Antonieta Iba, MD;  Location: [facility] ORS;  Service: Cardiovascular;  Laterality: N/A;   TEE WITHOUT CARDIOVERSION N/A 06/28/2021   Procedure: TRANSESOPHAGEAL ECHOCARDIOGRAM (TEE);  Surgeon: Antonieta Iba, MD;  Location: [facility] ORS;  Service: Cardiovascular;  Laterality: N/A;   TEE WITHOUT CARDIOVERSION N/A 07/04/2021   Procedure: TRANSESOPHAGEAL ECHOCARDIOGRAM (TEE);  Surgeon: Laurey Morale, MD;  Location: [facility] ENDOSCOPY;  Service: Cardiovascular;  Laterality: N/A;   TRIGGER FINGER RELEASE      Current Outpatient Medications  Medication Instructions    acetaminophen (TYLENOL) 325-650 mg, Oral, Every 6 hours PRN   albuterol (ACCUNEB) 0.63 MG/3ML nebulizer solution 1 ampule, Nebulization, Every 6 hours PRN   apixaban (ELIQUIS) 5 mg, Oral, 2 times daily   atropine 1 % ophthalmic solution 1 drop, Both Eyes, Daily at bedtime   cefadroxil (DURICEF) 500 MG capsule TAKE 1 CAPSULE BY MOUTH TWICE DAILY .PATIENT  NEEDS  APPOINTMENT   dapagliflozin propanediol (FARXIGA) 10 mg, Oral, Daily before breakfast   DULoxetine (CYMBALTA) 60 mg, Daily   gabapentin (NEURONTIN) 300 mg, Oral, 2 times daily   lidocaine (LIDODERM) 5 % 2 patches, Transdermal, Every 24 hours, Remove & Discard patch within 12 hours or as directed by MD   macitentan (OPSUMIT) 10 mg, Oral, Daily   meclizine (ANTIVERT) 25 mg, Oral, 3 times daily PRN   Menthol, Topical Analgesic, (BIOFREEZE EX) 1 application , Topical, 4 times daily PRN   midodrine (PROAMATINE) 5 MG tablet Take one tablet at 12 noon, 4pm and 8pm   oxyCODONE (OXY IR/ROXICODONE) 5 mg, Oral, Every 4 hours PRN   pantoprazole (PROTONIX) 40 mg, Oral, Daily before breakfast   polyethylene glycol (MIRALAX / GLYCOLAX) 17 g, Oral, Daily   potassium chloride SA (KLOR-CON M) 20 MEQ tablet 40 mEq, Oral, Daily   rosuvastatin (CRESTOR) 10 mg, Oral, Daily   sildenafil (REVATIO) 20 mg, Oral, 3 times daily   torsemide (DEMADEX) 40 mg, Oral, Daily   Uptravi 1,600 mcg, Oral, 2 times daily    Social History:  The patient  reports that she quit smoking about 32 years ago. Her smoking use included cigarettes. She started smoking about 34 years ago. She has a 0.6 pack-year smoking history. She has been exposed to tobacco smoke. She has never used smokeless tobacco. She reports that she does not drink alcohol and does not use drugs.   Family History:  The patient's family history includes Bone cancer in her sister; Breast cancer in her maternal aunt and sister; COPD in her mother; Heart disease in her mother; Hypertension in her father, mother,  sister, and sister; Stroke in her son; Thyroid disease in her mother, sister, and sister.  ROS:  Please see the history of present illness. All other systems are reviewed and otherwise negative.   PHYSICAL EXAM:  VS:  BP 122/60 (BP Location: Right Arm, Patient Position: Sitting)   Pulse 61   Ht 5' (1.524 m)   Wt 196 lb 12.8 oz (89.3 kg)   SpO2 98%   BMI 38.43 kg/m  BMI: Body mass index is 38.43 kg/m.  GEN- The patient is well appearing, alert and oriented x 3 today.  Lungs- Clear to ausculation bilaterally, normal work of breathing.  Heart- Regular rate and rhythm, no murmurs, rubs or gallops Extremities- 2-3+ LLE edema appears chronic. No RLE edema, warm, dry Skin-   device pocket well-healed, no tethering   Device interrogation done today and reviewed by myself:  Battery 3 years Lead thresholds, impedence, sensing stable  Atrially dependent Ventricular intrinsic at 31bpm Rare, very brief NSVT episode Rare AFib episodes  No changes made today  Battery advisory   EKG is ordered. Personal review of EKG from  today  shows:   EKG Interpretation Date/Time:  Tuesday February 03 2023 15:21:16 EDT Ventricular Rate:  60 PR Interval:  194 QRS Duration:  180 QT Interval:  488 QTC Calculation: 488 R Axis:   -74  Text Interpretation: AV dual-paced rhythm When compared with ECG of 24-Oct-2022 10:18, Vent. rate has decreased BY   7 BPM Confirmed by Sherie Don [phone]) on 02/03/2023 4:38:33 PM    Recent Labs: 04/22/2022: B Natriuretic Peptide 57.8 10/25/2022: Magnesium 2.0 11/18/2022: ALT 15 01/19/2023: BUN 34; Creatinine 1.25; Hemoglobin 10.3; Platelet Count 174; Potassium 4.6; Sodium 139  No results found for requested labs within last 365 days.   Estimated Creatinine Clearance: 36.3 mL/min (A) (by C-G formula based on SCr of 1.25 mg/dL (H)).   Wt Readings from Last 3 Encounters:  02/03/23 196 lb 12.8 oz (89.3 kg)  01/19/23 197 lb 8 oz (89.6 kg)  12/29/22 200 lb 3.2 oz (90.8  kg)     Additional studies reviewed include: Previous EP, cardiology notes.   RHC, 02/11/2022 1. Normal filling pressures.  2. Normal PA pressure.   TTE, 08/23/2021  1. Left ventricular ejection fraction, by estimation, is 60 to 65%. The left ventricle has normal function. The left ventricle has no regional wall motion abnormalities. There is mild left ventricular hypertrophy. Left ventricular diastolic parameters are consistent with Grade I diastolic dysfunction (impaired relaxation).   2. Right ventricular systolic function is normal. The right ventricular size is normal. There is mildly elevated pulmonary artery systolic pressure. The estimated right ventricular systolic pressure is 40.3 mmHg.   3. Left atrial size was moderately dilated.   4. The mitral valve is normal in structure. No evidence of mitral valve regurgitation. No evidence of mitral stenosis.   5. Tricuspid valve regurgitation is moderate.   6. The aortic valve is normal in structure. Aortic valve regurgitation is not visualized. Aortic valve sclerosis/calcification is present, without any evidence of aortic stenosis.   7. The inferior vena cava is normal in size with greater than 50% respiratory variability, suggesting right atrial pressure of 3 mmHg.   8. RV pacer wire difficult to visualize, grossly no vegetation noted.   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   Therefore, the patient's annual risk of stroke is 12.2 %.    Stroke ppx - 5mg  eliquis BID, appropriately dosed No bleeding concerns        Current medicines are reviewed at  length with the patient today.   The patient does not have concerns regarding her medicines.  The following changes were made today:  none  Labs/ tests ordered today include:  Orders Placed This Encounter  Procedures   CBC   Basic metabolic panel   EKG 12-Lead     Disposition: Follow up with Dr. Graciela Husbands or EP APP as usual post procedure   Signed, Sherie Don, NP  02/03/23  4:38 PM  Electrophysiology [facility]

## 2023-02-03 ENCOUNTER — Ambulatory Visit: Payer: Medicare HMO | Attending: Cardiology | Admitting: Cardiology

## 2023-02-03 VITALS — BP 122/60 | HR 61 | Ht 60.0 in | Wt 196.8 lb

## 2023-02-03 DIAGNOSIS — Z950 Presence of cardiac pacemaker: Secondary | ICD-10-CM

## 2023-02-03 DIAGNOSIS — I480 Paroxysmal atrial fibrillation: Secondary | ICD-10-CM

## 2023-02-03 DIAGNOSIS — Z79899 Other long term (current) drug therapy: Secondary | ICD-10-CM | POA: Diagnosis not present

## 2023-02-03 DIAGNOSIS — R079 Chest pain, unspecified: Secondary | ICD-10-CM

## 2023-02-03 DIAGNOSIS — I495 Sick sinus syndrome: Secondary | ICD-10-CM

## 2023-02-03 NOTE — Patient Instructions (Addendum)
Medication Instructions:  Your physician recommends that you continue on your current medications as directed. Please refer to the Current Medication list given to you today.   *If you need a refill on your cardiac medications before your next appointment, please call your pharmacy*   Lab Work: Your provider would like for you to have following labs drawn today (CBC, BMP).  .   Testing/Procedures: No test ordered today    Follow-Up: At [facility], you and your health needs are our priority.  As part of our continuing mission to provide you with exceptional heart care, we have created designated Provider Care Teams.  These Care Teams include your primary Cardiologist (physician) and Advanced Practice Providers (APPs -  Physician Assistants and Nurse Practitioners) who all work together to provide you with the care you need, when you need it.  We recommend signing up for the patient portal called "MyChart".  Sign up information is provided on this After Visit Summary.  MyChart is used to connect with patients for Virtual Visits (Telemedicine).  Patients are able to view lab/test results, encounter notes, upcoming appointments, etc.  Non-urgent messages can be sent to your provider as well.   To learn more about what you can do with MyChart, go to [email or web address].    Your next appointment:   6 month(s)  Provider:   Sherie Don, NP

## 2023-02-05 ENCOUNTER — Encounter (INDEPENDENT_AMBULATORY_CARE_PROVIDER_SITE_OTHER): Payer: Self-pay

## 2023-02-06 ENCOUNTER — Telehealth: Payer: Self-pay

## 2023-02-06 NOTE — Telephone Encounter (Signed)
Called patient, spoke with daughter, offered to get her mother set up for a virtual visit. However, she is not able to do early appointments as her mother does not do well with any visits this early. I did review the schedule to see if Dr.Klein was available any other days here, the only option was September 12th, patient daughter states this is okay with them, but wanted to check with NP as well to see if this was to far out.  Thanks!

## 2023-02-09 NOTE — Telephone Encounter (Signed)
I called daughter, LVM to call back to get scheduled.  Left call back number.

## 2023-02-10 DIAGNOSIS — Z961 Presence of intraocular lens: Secondary | ICD-10-CM | POA: Diagnosis not present

## 2023-02-10 DIAGNOSIS — H40003 Preglaucoma, unspecified, bilateral: Secondary | ICD-10-CM | POA: Diagnosis not present

## 2023-02-10 DIAGNOSIS — H2013 Chronic iridocyclitis, bilateral: Secondary | ICD-10-CM | POA: Diagnosis not present

## 2023-02-11 ENCOUNTER — Other Ambulatory Visit: Payer: Self-pay | Admitting: Internal Medicine

## 2023-02-11 ENCOUNTER — Encounter: Payer: Medicare HMO | Admitting: Cardiology

## 2023-02-11 NOTE — Telephone Encounter (Signed)
Patient is returning call in regards to setting up virtual appt. Requesting call back to her daughter.

## 2023-02-11 NOTE — Telephone Encounter (Signed)
Do you want to refill? 

## 2023-02-12 ENCOUNTER — Encounter: Payer: Medicare HMO | Admitting: Cardiology

## 2023-02-12 DIAGNOSIS — R5381 Other malaise: Secondary | ICD-10-CM | POA: Diagnosis not present

## 2023-02-12 NOTE — Telephone Encounter (Signed)
Contacted daughter- number below.  Scheduled for a telephone visit with Dr.Klein when he is back in office 09/05 at 11:40 AM. They are aware someone will call them before hand, and update them with a time frame of when visit will take place.  Daughter verbalized understanding, thankful for call back.

## 2023-02-12 NOTE — Telephone Encounter (Signed)
Hey Dr.Vu okay to refill? Patient has a follow up with Dr.Ravishankar on 9/19

## 2023-02-13 ENCOUNTER — Encounter: Payer: Medicare HMO | Admitting: Cardiology

## 2023-02-19 DIAGNOSIS — J449 Chronic obstructive pulmonary disease, unspecified: Secondary | ICD-10-CM | POA: Diagnosis not present

## 2023-02-19 DIAGNOSIS — J9611 Chronic respiratory failure with hypoxia: Secondary | ICD-10-CM | POA: Diagnosis not present

## 2023-02-19 DIAGNOSIS — E1122 Type 2 diabetes mellitus with diabetic chronic kidney disease: Secondary | ICD-10-CM | POA: Diagnosis not present

## 2023-02-19 DIAGNOSIS — I5042 Chronic combined systolic (congestive) and diastolic (congestive) heart failure: Secondary | ICD-10-CM | POA: Diagnosis not present

## 2023-02-19 DIAGNOSIS — I4891 Unspecified atrial fibrillation: Secondary | ICD-10-CM | POA: Diagnosis not present

## 2023-02-19 DIAGNOSIS — D631 Anemia in chronic kidney disease: Secondary | ICD-10-CM | POA: Diagnosis not present

## 2023-02-19 DIAGNOSIS — I130 Hypertensive heart and chronic kidney disease with heart failure and stage 1 through stage 4 chronic kidney disease, or unspecified chronic kidney disease: Secondary | ICD-10-CM | POA: Diagnosis not present

## 2023-02-19 DIAGNOSIS — J8410 Pulmonary fibrosis, unspecified: Secondary | ICD-10-CM | POA: Diagnosis not present

## 2023-02-19 DIAGNOSIS — N189 Chronic kidney disease, unspecified: Secondary | ICD-10-CM | POA: Diagnosis not present

## 2023-02-20 DIAGNOSIS — J449 Chronic obstructive pulmonary disease, unspecified: Secondary | ICD-10-CM | POA: Diagnosis not present

## 2023-02-20 DIAGNOSIS — J9611 Chronic respiratory failure with hypoxia: Secondary | ICD-10-CM | POA: Diagnosis not present

## 2023-02-20 DIAGNOSIS — D631 Anemia in chronic kidney disease: Secondary | ICD-10-CM | POA: Diagnosis not present

## 2023-02-20 DIAGNOSIS — J8410 Pulmonary fibrosis, unspecified: Secondary | ICD-10-CM | POA: Diagnosis not present

## 2023-02-20 DIAGNOSIS — E1122 Type 2 diabetes mellitus with diabetic chronic kidney disease: Secondary | ICD-10-CM | POA: Diagnosis not present

## 2023-02-20 DIAGNOSIS — I130 Hypertensive heart and chronic kidney disease with heart failure and stage 1 through stage 4 chronic kidney disease, or unspecified chronic kidney disease: Secondary | ICD-10-CM | POA: Diagnosis not present

## 2023-02-20 DIAGNOSIS — I4891 Unspecified atrial fibrillation: Secondary | ICD-10-CM | POA: Diagnosis not present

## 2023-02-20 DIAGNOSIS — I5042 Chronic combined systolic (congestive) and diastolic (congestive) heart failure: Secondary | ICD-10-CM | POA: Diagnosis not present

## 2023-02-20 DIAGNOSIS — N189 Chronic kidney disease, unspecified: Secondary | ICD-10-CM | POA: Diagnosis not present

## 2023-02-24 ENCOUNTER — Ambulatory Visit (INDEPENDENT_AMBULATORY_CARE_PROVIDER_SITE_OTHER): Payer: Medicare HMO

## 2023-02-24 DIAGNOSIS — I480 Paroxysmal atrial fibrillation: Secondary | ICD-10-CM | POA: Diagnosis not present

## 2023-02-25 ENCOUNTER — Ambulatory Visit: Payer: Self-pay

## 2023-02-25 LAB — CUP PACEART REMOTE DEVICE CHECK
Battery Remaining Longevity: 36 mo
Battery Remaining Percentage: 42 %
Brady Statistic RA Percent Paced: 98 %
Brady Statistic RV Percent Paced: 99 %
Date Time Interrogation Session: 20240904115200
Implantable Lead Connection Status: 753985
Implantable Lead Connection Status: 753985
Implantable Lead Implant Date: 20150720
Implantable Lead Implant Date: 20150720
Implantable Lead Location: 753859
Implantable Lead Location: 753860
Implantable Lead Model: 5076
Implantable Lead Model: 5076
Implantable Pulse Generator Implant Date: 20150720
Lead Channel Impedance Value: 334 Ohm
Lead Channel Impedance Value: 482 Ohm
Lead Channel Pacing Threshold Amplitude: 0.7 V
Lead Channel Pacing Threshold Pulse Width: 0.4 ms
Lead Channel Setting Pacing Amplitude: 2 V
Lead Channel Setting Pacing Amplitude: 2.4 V
Lead Channel Setting Pacing Pulse Width: 0.4 ms
Lead Channel Setting Sensing Sensitivity: 2.5 mV
Pulse Gen Serial Number: 390330
Zone Setting Status: 755011

## 2023-02-26 ENCOUNTER — Telehealth: Payer: Self-pay

## 2023-02-26 ENCOUNTER — Ambulatory Visit: Payer: Medicare HMO | Attending: Internal Medicine | Admitting: Internal Medicine

## 2023-02-26 ENCOUNTER — Encounter: Payer: Self-pay | Admitting: Internal Medicine

## 2023-02-26 VITALS — Ht 60.0 in

## 2023-02-26 DIAGNOSIS — I480 Paroxysmal atrial fibrillation: Secondary | ICD-10-CM | POA: Diagnosis not present

## 2023-02-26 DIAGNOSIS — I495 Sick sinus syndrome: Secondary | ICD-10-CM

## 2023-02-26 NOTE — Patient Instructions (Signed)
Medication Instructions:  The current medical regimen is effective;  continue present plan and medications.  *If you need a refill on your cardiac medications before your next appointment, please call your pharmacy*   Lab Work: BMET, CBC with diff- come to the [facility] lab, no lab appointment needed.  If you have labs (blood work) drawn today and your tests are completely normal, you will receive your results only by: MyChart Message (if you have MyChart) OR A paper copy in the mail If you have any lab test that is abnormal or we need to change your treatment, we will call you to review the results.   Testing/Procedures: Device Gen Change- we will be in contact to get scheduled.    Follow-Up: At [facility], you and your health needs are our priority.  As part of our continuing mission to provide you with exceptional heart care, we have created designated Provider Care Teams.  These Care Teams include your primary Cardiologist (physician) and Advanced Practice Providers (APPs -  Physician Assistants and Nurse Practitioners) who all work together to provide you with the care you need, when you need it.  We recommend signing up for the patient portal called "MyChart".  Sign up information is provided on this After Visit Summary.  MyChart is used to connect with patients for Virtual Visits (Telemedicine).  Patients are able to view lab/test results, encounter notes, upcoming appointments, etc.  Non-urgent messages can be sent to your provider as well.   To learn more about what you can do with MyChart, go to [email or web address].    Your next appointment:   10 days with device clinic- 91 day follow up with Dr.Klein  Other Instructions See letter with further instructions.

## 2023-02-26 NOTE — Patient Outreach (Signed)
  Care Coordination   Follow Up Visit Note   02/26/2023 Late entry for 02/25/23 Name: Debra Saunders MRN: [id number] DOB: 1945/12/02  Debra Saunders is a 77 y.o. year old female who sees Birdie Sons, Yehuda Mao, MD for primary care. I spoke with  [name] by phone today.  What matters to the patients health and wellness today?  Patient states she is receiving home health physical therapy.  Patient states she continues to have CP off and on. She reports provider increasing her gabapentin dosage to help with the costochondritis chest pain. She states it helps some but not much.  Patient reports having recent follow up visit with oncologist due to her anemia.  She reports continuing her iron medication, having a B12  shot and venofer.  She states her next oncology follow up is in 1 month.  Patient states her energy increase for a week or so after receiving treatment then the fatigue returns.  Patient states she is scheduled to have a meeting with her cardiologist on tomorrow to discuss treatment options related to her pacemaker.  She states her pacemaker has been recalled however providers concerned about the bacteria on her current pacemaker wire leads and removal.     Goals Addressed             This Visit's Progress    management of health conditions.       Interventions Today    Flowsheet Row Most Recent Value  Chronic Disease   Chronic disease during today's visit Other  [dizziness, anemia / fatigue, abdominal pain, costochondritis at central chest]  General Interventions   General Interventions Discussed/Reviewed General Interventions Reviewed, Doctor Visits  [evaluation of current treatment plan for mentioned health conditions and patients adherence to plan as established by provider. Assessed for CP, dizziness, anemia symptoms, ongoing abdominal pain]  Doctor Visits Discussed/Reviewed Doctor Visits Reviewed  [reviewed upcoming provider appointments. Advised to keep follow up  appointments with providers.]  Education Interventions   Education Provided Provided Education  [Advised to notify provider of worsening dizziness, fatigue, CP, abdominal pain to provider. and call 911 for severe symptoms.]  Pharmacy Interventions   Pharmacy Dicussed/Reviewed Pharmacy Topics Reviewed  [medications reviewed.  Advised to take medications as prescribed.]              SDOH assessments and interventions completed:  No     Care Coordination Interventions:  Yes, provided   Follow up plan: Follow up call scheduled for 03/25/23    Encounter Outcome:  Patient Visit Completed   George Ina RN,BSN,CCM [facility] Care Coordination [phone] direct line

## 2023-02-26 NOTE — Telephone Encounter (Signed)
..   Pt understands that although there may be some limitations with this type of visit, we will take all precautions to reduce any security or privacy concerns.  Pt understands that this will be treated like an in office visit and we will file with pt's insurance, and there may be a patient responsible charge related to this service. ? ?

## 2023-02-26 NOTE — Patient Instructions (Signed)
Visit Information  Thank you for taking time to visit with me today. Please don't hesitate to contact me if I can be of assistance to you.   Following are the goals we discussed today:   Goals Addressed             This Visit's Progress    management of health conditions.       Interventions Today    Flowsheet Row Most Recent Value  Chronic Disease   Chronic disease during today's visit Other  [dizziness, anemia / fatigue, abdominal pain, costochondritis at central chest]  General Interventions   General Interventions Discussed/Reviewed General Interventions Reviewed, Doctor Visits  [evaluation of current treatment plan for mentioned health conditions and patients adherence to plan as established by provider. Assessed for CP, dizziness, anemia symptoms, ongoing abdominal pain]  Doctor Visits Discussed/Reviewed Doctor Visits Reviewed  [reviewed upcoming provider appointments. Advised to keep follow up appointments with providers.]  Education Interventions   Education Provided Provided Education  [Advised to notify provider of worsening dizziness, fatigue, CP, abdominal pain to provider. and call 911 for severe symptoms.]  Pharmacy Interventions   Pharmacy Dicussed/Reviewed Pharmacy Topics Reviewed  [medications reviewed.  Advised to take medications as prescribed.]              Our next appointment is by telephone on 03/25/23 at 2:30pm  Please call the care guide team at [phone] if you need to cancel or reschedule your appointment.   If you are experiencing a Mental Health or Behavioral Health Crisis or need someone to talk to, please call the Suicide and Crisis Lifeline: 988 call 1-800-273-TALK (toll free, 24 hour hotline)  Patient verbalizes understanding of instructions and care plan provided today and agrees to view in MyChart. Active MyChart status and patient understanding of how to access instructions and care plan via MyChart confirmed with patient.     George Ina RN,BSN,CCM [facility] Care Coordination [phone] direct line

## 2023-02-26 NOTE — Addendum Note (Signed)
Addended by: Duke Salvia on: 02/26/2023 01:22 PM   Modules accepted: Orders

## 2023-02-26 NOTE — Progress Notes (Signed)
Electrophysiology TeleHealth Note      Date:  02/26/2023   ID:  Debra Saunders, DOB 03-Jul-1945, MRN [id number]  Location: patient's home  Provider location: [address] [place]  Evaluation Performed: Follow-up visit  PCP:  Glori Luis, MD  Cardiologist:    Electrophysiologist:  SK   Chief Complaint:    History of Present Illness:    Debra Saunders is a 77 y.o. female who presents via audio conferencing for a telehealth visit today.  Since last being seen in our clinic for Seen in followup for [facility] Scientific pacemaker implanted 7/15 for bradycardia and chronotropic incompetence.  Atrial fib on Apixaban cx by iron deficiency anemia with a saturation of 10% 1/23 and started on amiodarone subsequently stopped   Admitted 1/23 with endocarditis and shock with blood cultures positive for Streptococcus G TEE showed a large vegetation on her pacer wire.  And was transferred from [facility] to [facility] for consideration of extraction.  It was felt that she was not a candidate that she was to be treated with 6 weeks of antibiotics.  Long term suppressive antibiotics have been initiated.     Hypertension with orthostatic hypotension started on midodrine by CHF clinic    Chronic dyspnea.  Mostly nonambulatory.  Pulmonary hypertension has been improved with therapy with sildenafil and macitentan .  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      DATE TEST EF    4/19 LHC   No obstruc CAD  3/21 Echo  60-65% Pa PRESS 50   1/23 Echo  60-65% RVSP 51 RV failure  8/23 RHC  Nl filling pressures    Date Cr K Hgb TSH LFTs  2/21 1.16 3.8 8.3     1/23 1.29 3.7  8.6 (iron sat  9%) 0.054 16  8/23  3.3     8/24 0.97 4.4 10.0        the patient reports doing relatively well although she is not very active Device is on advisory recall.  With her high  degree of ventricular and atrial pacing is advised to replace it as sensitivity is reprogrammed to 0.1 mV with concerns of patient inhibition  Thromboembolic risk factors ( age  -2, HTN-1, TIA/CVA-2, DM-1, Gender-1) for a CHADSVASc Score of >=7   The patient denies symptoms of fevers, chills, cough, or new SOB worrisome for COVID 19.   Past Medical History:  Diagnosis Date   Arthritis    Atrial fibrillation, persistent (HCC)    a. s/p TEE-guided DCCV on 08/20/2017; b. 06/2021 Recurrent Afib-->amio/TEE/DCCV.   CAD (coronary artery disease)    a. 01/2012 Cath: nonobs dzs; b. 04/2014 MV: No ischemia; c. 09/2017 Cath: Minimal nonobs RCA dzs, otw nl cors.   Cardiorenal syndrome    a. 09/2017 Creat rose to 2.7 w/ diuresis-->HD x 2.   Chronic back pain    Chronic combined systolic (congestive) and diastolic (congestive) heart failure (HCC)    a. Dx 2005 @ [facility];  b. 02/2014 Echo: EF 55-60%; c. 07/2017: EF 30-35%; d. 09/2017 Echo: EF 40-45%; e. 02/2018 Echo: EF 55-60%; f. 08/2019 Echo: EF 60-65%; g. 08/2020 RHC: RA 3, RV 30/1, PA 33/11 (20), PCWP 6; f. 08/2021 Echo: EF 60-65%, no rwma, GrIDD, nl RV fxn, RVSP 40.64mmHg, mod dil LA, mod TR.   Chronic respiratory failure (HCC)    a. on  home O2   Gastritis    History of COVID-19    History of endocarditis 08/22/2021   History of intracranial hemorrhage    a. 6/14 described by neurosurgery as "small frontal posttraumatic SAH " - pt was on xarelto @ the time.   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 Chronic LLE edema.   NICM (nonischemic cardiomyopathy) (HCC)    a. 09/2017 Echo: EF 40-45%; b. 09/2017 Cath: minor, insignificant CAD; c. 02/2018 Echo: EF 55-60%, Gr1 DD; d. 08/2019 Echo: EF 60-65%, Gr2 DD.   Obesity    PAH (pulmonary artery hypertension) (HCC)    a. 09/2017 Echo: PASP ; b. 09/2017 RHC: RA 23, RV 84/13, PA 84/29, PCWP  20, CO 3.89, CI 1.89. LVEDP 18; c. 08/2020 RHC: RA 3, RV 30/1, PA 33/11 (20), PCWP 6. CO/CI 6.18/3.36. PVR 2.3 WU.   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 06/2021. Veg on PPM lead. Not candidate for extraction-->s/p 6 wks abx.   Sepsis with metabolic encephalopathy (HCC)    Sleep apnea    Streptococcal bacteremia    Stroke [facility]) 2014   Subarachnoid hemorrhage (HCC) 01/19/2013   Symptomatic bradycardia    a. s/p BSX PPM.   Thyroid nodule    Type II diabetes mellitus (HCC)    Urinary incontinence     Past Surgical History:  Procedure Laterality Date   ABDOMINAL HYSTERECTOMY  1969   BREAST BIOPSY Right 10/28/2019   lymph node bx, [place] Bx, pending path    CARDIAC CATHETERIZATION  2013   @ [facility]: No obstructive CAD: Only 20% ostial left circumflex.   CARDIOVERSION N/A 10/12/2017   Procedure: CARDIOVERSION;  Surgeon: Antonieta Iba, MD;  Location: [facility] ORS;  Service: Cardiovascular;  Laterality: N/A;   CARDIOVERSION N/A 07/04/2021   Procedure: CARDIOVERSION;  Surgeon: Laurey Morale, MD;  Location: [facility] ENDOSCOPY;  Service: Cardiovascular;  Laterality: N/A;   CATARACT EXTRACTION W/PHACO Right 09/19/2020   Procedure: CATARACT EXTRACTION PHACO AND INTRAOCULAR LENS PLACEMENT (IOC) RIGHT DIABETIC 3.95 00:43.0 9.2%;  Surgeon: Lockie Mola, MD;  Location: [facility] SURGERY CNTR;  Service: Ophthalmology;  Laterality: Right;  Pt requests to be last sleep apnea   CATARACT EXTRACTION W/PHACO Left 10/03/2020   Procedure: CATARACT EXTRACTION PHACO AND INTRAOCULAR LENS PLACEMENT (IOC) LEFT DIABETIC;  Surgeon: Lockie Mola, MD;  Location: [facility] SURGERY CNTR;  Service: Ophthalmology;  Laterality: Left;  3.25 0:59.3 5.5%   CHOLECYSTECTOMY     COLONOSCOPY WITH PROPOFOL N/A 09/10/2018   Procedure: COLONOSCOPY WITH PROPOFOL;  Surgeon: Scot Jun, MD;  Location: [facility] ENDOSCOPY;  Service: Endoscopy;  Laterality: N/A;   COLONOSCOPY  WITH PROPOFOL N/A 10/22/2022   Procedure: COLONOSCOPY WITH PROPOFOL;  Surgeon: Jaynie Collins, DO;  Location: [facility] ENDOSCOPY;  Service: Gastroenterology;  Laterality: N/A;   CYST REMOVAL TRUNK  2002   BACK   DIALYSIS/PERMA CATHETER INSERTION N/A 10/06/2017   Procedure: DIALYSIS/PERMA CATHETER INSERTION;  Surgeon: Renford Dills, MD;  Location: [facility] INVASIVE CV LAB;  Service: Cardiovascular;  Laterality: N/A;   ESOPHAGOGASTRODUODENOSCOPY (EGD) WITH PROPOFOL N/A 10/21/2022   Procedure: ESOPHAGOGASTRODUODENOSCOPY (EGD) WITH PROPOFOL;  Surgeon: Jaynie Collins, DO;  Location[facility] ENDOSCOPY;  Service: Gastroenterology;  Laterality: N/A;   GIVENS CAPSULE STUDY N/A 10/22/2022   Procedure: GIVENS CAPSULE STUDY;  Surgeon: Jaynie Collins, DO;  Location: [facility] ENDOSCOPY;  Service: Gastroenterology;  Laterality: N/A;  IF NOTHING IS FOUND WITH COLONOSCOPY   INSERT / REPLACE / REMOVE PACEMAKER     lipoma removal  2014   back   PACEMAKER INSERTION  2015   [employer] dual chamber pacemaker implanted by Dr Graciela Husbands for symptomatic bradycardia   PERMANENT PACEMAKER INSERTION N/A 01/09/2014   Procedure: PERMANENT PACEMAKER INSERTION;  Surgeon: Duke Salvia, MD;  Location: [facility] CATH LAB;  Service: Cardiovascular;  Laterality: N/A;   RIGHT HEART CATH N/A 04/19/2018   Procedure: RIGHT HEART CATH;  Surgeon: Laurey Morale, MD;  Location: [facility] INVASIVE CV LAB;  Service: Cardiovascular;  Laterality: N/A;   RIGHT HEART CATH N/A 08/30/2020   Procedure: RIGHT HEART CATH;  Surgeon: Laurey Morale, MD;  Location: [facility] INVASIVE CV LAB;  Service: Cardiovascular;  Laterality: N/A;   RIGHT HEART CATH N/A 02/11/2022   Procedure: RIGHT HEART CATH;  Surgeon: Laurey Morale, MD;  Location: [facility] INVASIVE CV LAB;  Service: Cardiovascular;  Laterality: N/A;   RIGHT/LEFT HEART CATH AND CORONARY ANGIOGRAPHY N/A 10/19/2017   Procedure: RIGHT/LEFT HEART CATH AND CORONARY ANGIOGRAPHY;  Surgeon: Iran Ouch, MD;   Location: [facility] INVASIVE CV LAB;  Service: Cardiovascular;  Laterality: N/A;   TEE WITHOUT CARDIOVERSION N/A 08/20/2017   Procedure: TRANSESOPHAGEAL ECHOCARDIOGRAM (TEE) & Direct current cardioversion;  Surgeon: Antonieta Iba, MD;  Location: [facility] ORS;  Service: Cardiovascular;  Laterality: N/A;   TEE WITHOUT CARDIOVERSION N/A 06/28/2021   Procedure: TRANSESOPHAGEAL ECHOCARDIOGRAM (TEE);  Surgeon: Antonieta Iba, MD;  Location: [facility] ORS;  Service: Cardiovascular;  Laterality: N/A;   TEE WITHOUT CARDIOVERSION N/A 07/04/2021   Procedure: TRANSESOPHAGEAL ECHOCARDIOGRAM (TEE);  Surgeon: Laurey Morale, MD;  Location: [facility] ENDOSCOPY;  Service: Cardiovascular;  Laterality: N/A;   TRIGGER FINGER RELEASE      Current Outpatient Medications  Medication Sig Dispense Refill   acetaminophen (TYLENOL) 325 MG tablet Take 325-650 mg by mouth every 6 (six) hours as needed for mild pain.     albuterol (ACCUNEB) 0.63 MG/3ML nebulizer solution Take 1 ampule by nebulization every 6 (six) hours as needed for wheezing.     apixaban (ELIQUIS) 5 MG TABS tablet Take 1 tablet (5 mg total) by mouth 2 (two) times daily. 180 tablet 3   atropine 1 % ophthalmic solution Place 1 drop into both eyes at bedtime.     cefadroxil (DURICEF) 500 MG capsule TAKE 1 CAPSULE BY MOUTH TWICE DAILY. APPOINTMENT NEEDED. 60 capsule 0   dapagliflozin propanediol (FARXIGA) 10 MG TABS tablet Take 1 tablet (10 mg total) by mouth daily before breakfast. 90 tablet 6   DULoxetine (CYMBALTA) 60 MG capsule Take 60 mg by mouth daily.     gabapentin (NEURONTIN) 600 MG tablet Take 600 mg by mouth daily. Patient states takes 1 tablet at bedtime.     lidocaine (LIDODERM) 5 % Place 2 patches onto the skin daily. Remove & Discard patch within 12 hours or as directed by MD 30 patch 0   macitentan (OPSUMIT) 10 MG tablet Take 1 tablet (10 mg total) by mouth daily. 90 tablet 3   meclizine (ANTIVERT) 25 MG tablet Take 1 tablet (25 mg total) by mouth 3 (three)  times daily as needed for dizziness. 30 tablet 1   Menthol, Topical Analgesic, (BIOFREEZE EX) Apply 1 application topically 4 (four) times daily as needed (for pain).     midodrine (PROAMATINE) 5 MG tablet Take one  tablet at 12 noon, 4pm and 8pm 270 tablet 3   oxyCODONE (OXY IR/ROXICODONE) 5 MG immediate release tablet Take 1 tablet (5 mg total) by mouth every 4 (four) hours as needed for moderate pain, breakthrough pain or severe pain. 12 tablet 0   potassium chloride SA (KLOR-CON M) 20 MEQ tablet Take 40 mEq by mouth daily.     Selexipag (UPTRAVI) 1600 MCG TABS Take 1 tablet (1,600 mcg total) by mouth 2 (two) times daily. 60 tablet 11   sildenafil (REVATIO) 20 MG tablet Take 1 tablet (20 mg total) by mouth 3 (three) times daily. 90 tablet 3   torsemide (DEMADEX) 20 MG tablet Take 2 tablets (40 mg total) by mouth daily. 60 tablet 6   No current facility-administered medications for this visit.    Allergies:   Aspirin and Other   ROS:  Please see the history of present illness.   All other systems are personally reviewed and negative.    Exam:    Vital Signs:  Ht 5' (1.524 m)   BMI 38.43 kg/m     Labs/Other Tests and Data Reviewed:    Recent Labs: 04/22/2022: B Natriuretic Peptide 57.8 10/25/2022: Magnesium 2.0 11/18/2022: ALT 15 02/03/2023: BUN 23; Creatinine, Ser 0.97; Hemoglobin 10.0; Platelets 198; Potassium 4.4; Sodium 144   Wt Readings from Last 3 Encounters:  02/03/23 196 lb 12.8 oz (89.3 kg)  01/19/23 197 lb 8 oz (89.6 kg)  12/29/22 200 lb 3.2 oz (90.8 kg)     Other studies personally reviewed: Additional studies/ records that were reviewed today include: (As above)  prior records  Review of the above records today demonstrates:   Prior radiographs:      ASSESSMENT & PLAN:    Chronotropic incompetence   Sinus bradycardia  AV conduction disease   Atrial fibrillation-persistent requiring cardioversion   Atrial tachycardia non sustained with rates 200 msec (ie  nonsustained AFib)   Cardiac implantable device infection-Streptococcus-chronic   Pacemaker [employer]-advisory recall   HFpEF   Hypertension and orthostatic hypotension   Anemia-iron deficiency  The patient's pacemaker is now on advisory recall associated with the risk of reversion to 0.1 mV sensitivity and fixed pacing.  Given her high degree of ventricular and atrial pacing is recommended that her device generator be replaced as over sensing could result in pacing inhibition  Discussed risks and benefits with her and her daughter.  The major issue would be infection.  This is already an ongoing concern and she takes chronic suppressive antibiotics.  We will hold her Eliquis for just 2 doses prior to the procedure and resume it shortly thereafter, randomized data suggesting that there is no real benefit in holding the Eliquis  Given her MRI Medtronic leads, we will replace her [facility] [employer] device with a Medtronic pulse generator in the event that MRI is needed in the future    Follow-up:  post implant    Current medicines are reviewed at length with the patient today.   The patient  concerns regarding her medicines.  The following changes were made today:    Labs/ tests ordered today include: preop labs Orders Placed This Encounter  Procedures   CBC With Differential   Basic metabolic panel      Today, I have spent 21 minutes with the patient with telehealth technology discussing the above.  Signed, Sherryl Manges, MD  02/26/2023 1:13 PM     [facility] [address] [place] [address] [phone] (office) (  [phone] (fax)

## 2023-02-27 DIAGNOSIS — I130 Hypertensive heart and chronic kidney disease with heart failure and stage 1 through stage 4 chronic kidney disease, or unspecified chronic kidney disease: Secondary | ICD-10-CM | POA: Diagnosis not present

## 2023-02-27 DIAGNOSIS — J9611 Chronic respiratory failure with hypoxia: Secondary | ICD-10-CM | POA: Diagnosis not present

## 2023-02-27 DIAGNOSIS — J449 Chronic obstructive pulmonary disease, unspecified: Secondary | ICD-10-CM | POA: Diagnosis not present

## 2023-02-27 DIAGNOSIS — I4891 Unspecified atrial fibrillation: Secondary | ICD-10-CM | POA: Diagnosis not present

## 2023-02-27 DIAGNOSIS — D631 Anemia in chronic kidney disease: Secondary | ICD-10-CM | POA: Diagnosis not present

## 2023-02-27 DIAGNOSIS — N189 Chronic kidney disease, unspecified: Secondary | ICD-10-CM | POA: Diagnosis not present

## 2023-02-27 DIAGNOSIS — I5042 Chronic combined systolic (congestive) and diastolic (congestive) heart failure: Secondary | ICD-10-CM | POA: Diagnosis not present

## 2023-02-27 DIAGNOSIS — J8410 Pulmonary fibrosis, unspecified: Secondary | ICD-10-CM | POA: Diagnosis not present

## 2023-02-27 DIAGNOSIS — E1122 Type 2 diabetes mellitus with diabetic chronic kidney disease: Secondary | ICD-10-CM | POA: Diagnosis not present

## 2023-03-02 ENCOUNTER — Ambulatory Visit (INDEPENDENT_AMBULATORY_CARE_PROVIDER_SITE_OTHER): Payer: Medicare HMO | Admitting: Family Medicine

## 2023-03-02 ENCOUNTER — Encounter: Payer: Self-pay | Admitting: Family Medicine

## 2023-03-02 VITALS — BP 130/80 | HR 60 | Temp 98.7°F | Ht 60.0 in | Wt 204.2 lb

## 2023-03-02 DIAGNOSIS — R1013 Epigastric pain: Secondary | ICD-10-CM | POA: Diagnosis not present

## 2023-03-02 DIAGNOSIS — R5381 Other malaise: Secondary | ICD-10-CM | POA: Diagnosis not present

## 2023-03-02 DIAGNOSIS — M940 Chondrocostal junction syndrome [Tietze]: Secondary | ICD-10-CM

## 2023-03-02 DIAGNOSIS — N1832 Chronic kidney disease, stage 3b: Secondary | ICD-10-CM

## 2023-03-02 DIAGNOSIS — D631 Anemia in chronic kidney disease: Secondary | ICD-10-CM

## 2023-03-02 DIAGNOSIS — M7062 Trochanteric bursitis, left hip: Secondary | ICD-10-CM | POA: Diagnosis not present

## 2023-03-02 NOTE — Assessment & Plan Note (Signed)
Chronic issue.  Improved some.  She will see GI as planned later this week.

## 2023-03-02 NOTE — Assessment & Plan Note (Signed)
Chronic issue.  Continue oral iron supplementation.  She will continue to see hematology.

## 2023-03-02 NOTE — Assessment & Plan Note (Signed)
I encouraged patient to continue physical therapy.  Discussed it may take quite some time to improve her conditioning.

## 2023-03-02 NOTE — Assessment & Plan Note (Signed)
Chronic intermittent issue.  Discussed we cannot use tramadol given her history of a seizure many years ago.  Discussed her options are limited to Tylenol and/or oxycodone.  Patient notes she will stick with over-the-counter Tylenol 1000 mg 3 times daily as needed.

## 2023-03-02 NOTE — Progress Notes (Signed)
[name], MD Phone: [phone]  Debra Saunders is a 77 y.o. female who presents today for f/u.  Chronic anemia: Patient is following with hematology.  She is on iron supplement and Venofer.  Her hemoglobin has been improving.  Deconditioning: She started PT at home through pulmonology.  She has had 2 sessions.  She is not sure if it is beneficial.  She feels more tired afterwards.  Epigastric pain: Patient notes this has improved some.  She still has it though it is not as frequent.  She sees GI later this week.  Costochondritis: Patient continues to have intermittent issues with this.  She notes Tylenol is not terribly helpful.  She wonders if there is anything other than oxycodone that she can take for this.  Left leg pain: Patient notes pain in her left upper posterior lateral thigh.  This has been going on for some time.  Occurs when she stands or sits for too long.  She does have chronic back pain though her back does not hurt when this occurs.  She has chronic leg numbness.  She has chronic bowel and bladder incontinence though that has been going on for over a year and seems to be related to inability to get to the bathroom quickly enough.  Social History   Tobacco Use  Smoking Status Former   Current packs/day: 0.00   Average packs/day: 0.3 packs/day for 2.0 years (0.6 ttl pk-yrs)   Types: Cigarettes   Start date: 06/23/1988   Quit date: 06/23/1990   Years since quitting: 32.7   Passive exposure: Past  Smokeless Tobacco Never    Current Outpatient Medications on File Prior to Visit  Medication Sig Dispense Refill   acetaminophen (TYLENOL) 325 MG tablet Take 325-650 mg by mouth every 6 (six) hours as needed for mild pain.     albuterol (ACCUNEB) 0.63 MG/3ML nebulizer solution Take 1 ampule by nebulization every 6 (six) hours as needed for wheezing.     apixaban (ELIQUIS) 5 MG TABS tablet Take 1 tablet (5 mg total) by mouth 2 (two) times daily. 180 tablet 3   atropine  1 % ophthalmic solution Place 1 drop into both eyes at bedtime.     cefadroxil (DURICEF) 500 MG capsule TAKE 1 CAPSULE BY MOUTH TWICE DAILY. APPOINTMENT NEEDED. 60 capsule 0   dapagliflozin propanediol (FARXIGA) 10 MG TABS tablet Take 1 tablet (10 mg total) by mouth daily before breakfast. 90 tablet 6   DULoxetine (CYMBALTA) 60 MG capsule Take 60 mg by mouth daily.     gabapentin (NEURONTIN) 600 MG tablet Take 600 mg by mouth daily. Patient states takes 1 tablet at bedtime.     lidocaine (LIDODERM) 5 % Place 2 patches onto the skin daily. Remove & Discard patch within 12 hours or as directed by MD 30 patch 0   macitentan (OPSUMIT) 10 MG tablet Take 1 tablet (10 mg total) by mouth daily. 90 tablet 3   meclizine (ANTIVERT) 25 MG tablet Take 1 tablet (25 mg total) by mouth 3 (three) times daily as needed for dizziness. 30 tablet 1   Menthol, Topical Analgesic, (BIOFREEZE EX) Apply 1 application topically 4 (four) times daily as needed (for pain).     midodrine (PROAMATINE) 5 MG tablet Take one tablet at 12 noon, 4pm and 8pm 270 tablet 3   oxyCODONE (OXY IR/ROXICODONE) 5 MG immediate release tablet Take 1 tablet (5 mg total) by mouth every 4 (four) hours as needed for moderate pain, breakthrough pain  or severe pain. 12 tablet 0   potassium chloride SA (KLOR-CON M) 20 MEQ tablet Take 40 mEq by mouth daily.     Selexipag (UPTRAVI) 1600 MCG TABS Take 1 tablet (1,600 mcg total) by mouth 2 (two) times daily. 60 tablet 11   sildenafil (REVATIO) 20 MG tablet Take 1 tablet (20 mg total) by mouth 3 (three) times daily. 90 tablet 3   torsemide (DEMADEX) 20 MG tablet Take 2 tablets (40 mg total) by mouth daily. 60 tablet 6   No current facility-administered medications on file prior to visit.     ROS see history of present illness  Objective  Physical Exam Vitals:   03/02/23 1533 03/02/23 1606  BP: (!) 142/86 130/80  Pulse: 60   Temp: 98.7 F (37.1 C)   SpO2: 95%     BP Readings from Last 3  Encounters:  03/02/23 130/80  02/03/23 122/60  01/19/23 (!) 110/57   Wt Readings from Last 3 Encounters:  03/02/23 204 lb 3.2 oz (92.6 kg)  02/03/23 196 lb 12.8 oz (89.3 kg)  01/19/23 197 lb 8 oz (89.6 kg)    Physical Exam Constitutional:      General: She is not in acute distress.    Appearance: She is not diaphoretic.  Cardiovascular:     Rate and Rhythm: Normal rate and regular rhythm.     Heart sounds: Normal heart sounds.  Pulmonary:     Effort: Pulmonary effort is normal.     Breath sounds: Normal breath sounds.  Chest:    Musculoskeletal:       Legs:  Skin:    General: Skin is warm and dry.  Neurological:     Mental Status: She is alert.      Assessment/Plan: Please see individual problem list.  Epigastric pain Assessment & Plan: Chronic issue.  Improved some.  She will see GI as planned later this week.   Costochondritis Assessment & Plan: Chronic intermittent issue.  Discussed we cannot use tramadol given her history of a seizure many years ago.  Discussed her options are limited to Tylenol and/or oxycodone.  Patient notes she will stick with over-the-counter Tylenol 1000 mg 3 times daily as needed.   Trochanteric bursitis of left hip Assessment & Plan: Suspect trochanteric bursitis is the cause of her left hip pain.  She is tender over the greater trochanter on that side.  Offered referral to orthopedics to consider steroid injection though she declines.  Offered oral steroids though she declines.  She will monitor and let us know if she changes her mind.   Physical deconditioning Assessment & Plan: I encouraged patient to continue physical therapy.  Discussed it may take quite some time to improve her conditioning.   Anemia due to stage 3b chronic kidney disease (HCC) Assessment & Plan: Chronic issue.  Continue oral iron supplementation.  She will continue to see hematology.     Return in about 3 months (around 06/01/2023).  I have spent 32  minutes in the care of this patient regarding history taking, documentation, completion of exam, discussion of plan.   [name], MD [facility]

## 2023-03-02 NOTE — Assessment & Plan Note (Signed)
Suspect trochanteric bursitis is the cause of her left hip pain.  She is tender over the greater trochanter on that side.  Offered referral to orthopedics to consider steroid injection though she declines.  Offered oral steroids though she declines.  She will monitor and let us know if she changes her mind.

## 2023-03-02 NOTE — Patient Instructions (Signed)
Please let me know if you would like to try the oral prednisone for your hip.

## 2023-03-03 DIAGNOSIS — E1122 Type 2 diabetes mellitus with diabetic chronic kidney disease: Secondary | ICD-10-CM | POA: Diagnosis not present

## 2023-03-03 DIAGNOSIS — J8410 Pulmonary fibrosis, unspecified: Secondary | ICD-10-CM | POA: Diagnosis not present

## 2023-03-03 DIAGNOSIS — I130 Hypertensive heart and chronic kidney disease with heart failure and stage 1 through stage 4 chronic kidney disease, or unspecified chronic kidney disease: Secondary | ICD-10-CM | POA: Diagnosis not present

## 2023-03-03 DIAGNOSIS — D631 Anemia in chronic kidney disease: Secondary | ICD-10-CM | POA: Diagnosis not present

## 2023-03-03 DIAGNOSIS — I4891 Unspecified atrial fibrillation: Secondary | ICD-10-CM | POA: Diagnosis not present

## 2023-03-03 DIAGNOSIS — J449 Chronic obstructive pulmonary disease, unspecified: Secondary | ICD-10-CM | POA: Diagnosis not present

## 2023-03-03 DIAGNOSIS — I5042 Chronic combined systolic (congestive) and diastolic (congestive) heart failure: Secondary | ICD-10-CM | POA: Diagnosis not present

## 2023-03-03 DIAGNOSIS — J9611 Chronic respiratory failure with hypoxia: Secondary | ICD-10-CM | POA: Diagnosis not present

## 2023-03-03 DIAGNOSIS — N189 Chronic kidney disease, unspecified: Secondary | ICD-10-CM | POA: Diagnosis not present

## 2023-03-04 NOTE — Progress Notes (Signed)
Remote pacemaker transmission.   

## 2023-03-05 ENCOUNTER — Encounter: Payer: Medicare HMO | Admitting: Cardiology

## 2023-03-05 DIAGNOSIS — R197 Diarrhea, unspecified: Secondary | ICD-10-CM | POA: Diagnosis not present

## 2023-03-05 DIAGNOSIS — K909 Intestinal malabsorption, unspecified: Secondary | ICD-10-CM | POA: Diagnosis not present

## 2023-03-06 DIAGNOSIS — I5042 Chronic combined systolic (congestive) and diastolic (congestive) heart failure: Secondary | ICD-10-CM | POA: Diagnosis not present

## 2023-03-06 DIAGNOSIS — I4891 Unspecified atrial fibrillation: Secondary | ICD-10-CM | POA: Diagnosis not present

## 2023-03-06 DIAGNOSIS — D631 Anemia in chronic kidney disease: Secondary | ICD-10-CM | POA: Diagnosis not present

## 2023-03-06 DIAGNOSIS — I130 Hypertensive heart and chronic kidney disease with heart failure and stage 1 through stage 4 chronic kidney disease, or unspecified chronic kidney disease: Secondary | ICD-10-CM | POA: Diagnosis not present

## 2023-03-06 DIAGNOSIS — J9611 Chronic respiratory failure with hypoxia: Secondary | ICD-10-CM | POA: Diagnosis not present

## 2023-03-06 DIAGNOSIS — J449 Chronic obstructive pulmonary disease, unspecified: Secondary | ICD-10-CM | POA: Diagnosis not present

## 2023-03-06 DIAGNOSIS — E1122 Type 2 diabetes mellitus with diabetic chronic kidney disease: Secondary | ICD-10-CM | POA: Diagnosis not present

## 2023-03-06 DIAGNOSIS — N189 Chronic kidney disease, unspecified: Secondary | ICD-10-CM | POA: Diagnosis not present

## 2023-03-06 DIAGNOSIS — J8410 Pulmonary fibrosis, unspecified: Secondary | ICD-10-CM | POA: Diagnosis not present

## 2023-03-09 ENCOUNTER — Ambulatory Visit (INDEPENDENT_AMBULATORY_CARE_PROVIDER_SITE_OTHER): Payer: Medicare HMO | Admitting: *Deleted

## 2023-03-09 ENCOUNTER — Telehealth: Payer: Self-pay | Admitting: *Deleted

## 2023-03-09 VITALS — Ht 60.0 in | Wt 205.0 lb

## 2023-03-09 DIAGNOSIS — I130 Hypertensive heart and chronic kidney disease with heart failure and stage 1 through stage 4 chronic kidney disease, or unspecified chronic kidney disease: Secondary | ICD-10-CM | POA: Diagnosis not present

## 2023-03-09 DIAGNOSIS — D631 Anemia in chronic kidney disease: Secondary | ICD-10-CM | POA: Diagnosis not present

## 2023-03-09 DIAGNOSIS — Z0000 Encounter for general adult medical examination without abnormal findings: Secondary | ICD-10-CM

## 2023-03-09 DIAGNOSIS — J449 Chronic obstructive pulmonary disease, unspecified: Secondary | ICD-10-CM | POA: Diagnosis not present

## 2023-03-09 DIAGNOSIS — J8410 Pulmonary fibrosis, unspecified: Secondary | ICD-10-CM | POA: Diagnosis not present

## 2023-03-09 DIAGNOSIS — J9611 Chronic respiratory failure with hypoxia: Secondary | ICD-10-CM | POA: Diagnosis not present

## 2023-03-09 DIAGNOSIS — N189 Chronic kidney disease, unspecified: Secondary | ICD-10-CM | POA: Diagnosis not present

## 2023-03-09 DIAGNOSIS — I4891 Unspecified atrial fibrillation: Secondary | ICD-10-CM | POA: Diagnosis not present

## 2023-03-09 DIAGNOSIS — E1122 Type 2 diabetes mellitus with diabetic chronic kidney disease: Secondary | ICD-10-CM | POA: Diagnosis not present

## 2023-03-09 DIAGNOSIS — I5042 Chronic combined systolic (congestive) and diastolic (congestive) heart failure: Secondary | ICD-10-CM | POA: Diagnosis not present

## 2023-03-09 NOTE — Progress Notes (Signed)
Subjective:   Debra Saunders is a 77 y.o. female who presents for Medicare Annual (Subsequent) preventive examination.  Visit Complete: Virtual  I connected with  Debra Saunders on 03/09/23 by a audio enabled telemedicine application and verified that I am speaking with the correct person using two identifiers.  Patient Location: Home  Provider Location: Office/Clinic  I discussed the limitations of evaluation and management by telemedicine. The patient expressed understanding and agreed to proceed.  Vital Signs: Unable to obtain new vitals due to this being a telehealth visit.   Cardiac Risk Factors include: advanced age (>88men, >13 women);diabetes mellitus;dyslipidemia;hypertension;sedentary lifestyle;obesity (BMI >30kg/m2);Other (see comment), Risk factor comments: pacemaker     Objective:    Today's Vitals   03/09/23 1337 03/09/23 1338  Weight: 205 lb (93 kg)   Height: 5' (1.524 m)   PainSc:  7    Body mass index is 40.04 kg/m.     03/09/2023    2:01 PM 01/19/2023    2:46 PM 01/19/2023    2:31 PM 12/19/2022    1:09 PM 11/18/2022    1:00 PM 10/22/2022    2:51 PM 10/21/2022    1:13 PM  Advanced Directives  Does Patient Have a Medical Advance Directive? Yes No No No No No No  Type of Estate agent of [place];Living will        Copy of Healthcare Power of Attorney in Chart? No - copy requested        Would patient like information on creating a medical advance directive?    No - Patient declined No - Patient declined  No - Patient declined    Current Medications (verified) Outpatient Encounter Medications as of 03/09/2023  Medication Sig   acetaminophen (TYLENOL) 325 MG tablet Take 325-650 mg by mouth every 6 (six) hours as needed for mild pain.   albuterol (ACCUNEB) 0.63 MG/3ML nebulizer solution Take 1 ampule by nebulization every 6 (six) hours as needed for wheezing.   apixaban (ELIQUIS) 5 MG TABS tablet Take 1 tablet (5 mg total) by mouth 2  (two) times daily.   atropine 1 % ophthalmic solution Place 1 drop into both eyes at bedtime.   cefadroxil (DURICEF) 500 MG capsule TAKE 1 CAPSULE BY MOUTH TWICE DAILY. APPOINTMENT NEEDED.   dapagliflozin propanediol (FARXIGA) 10 MG TABS tablet Take 1 tablet (10 mg total) by mouth daily before breakfast.   DULoxetine (CYMBALTA) 60 MG capsule Take 60 mg by mouth daily.   gabapentin (NEURONTIN) 600 MG tablet Take 600 mg by mouth daily. Patient states takes 1 tablet at bedtime.   lidocaine (LIDODERM) 5 % Place 2 patches onto the skin daily. Remove & Discard patch within 12 hours or as directed by MD   macitentan (OPSUMIT) 10 MG tablet Take 1 tablet (10 mg total) by mouth daily.   meclizine (ANTIVERT) 25 MG tablet Take 1 tablet (25 mg total) by mouth 3 (three) times daily as needed for dizziness.   Menthol, Topical Analgesic, (BIOFREEZE EX) Apply 1 application topically 4 (four) times daily as needed (for pain).   midodrine (PROAMATINE) 5 MG tablet Take one tablet at 12 noon, 4pm and 8pm   oxyCODONE (OXY IR/ROXICODONE) 5 MG immediate release tablet Take 1 tablet (5 mg total) by mouth every 4 (four) hours as needed for moderate pain, breakthrough pain or severe pain.   potassium chloride SA (KLOR-CON M) 20 MEQ tablet Take 40 mEq by mouth daily.   Selexipag (UPTRAVI) 1600 MCG TABS  Take 1 tablet (1,600 mcg total) by mouth 2 (two) times daily.   sildenafil (REVATIO) 20 MG tablet Take 1 tablet (20 mg total) by mouth 3 (three) times daily.   torsemide (DEMADEX) 20 MG tablet Take 2 tablets (40 mg total) by mouth daily.   No facility-administered encounter medications on file as of 03/09/2023.    Allergies (verified) Aspirin and Other   History: Past Medical History:  Diagnosis Date   Arthritis    Atrial fibrillation, persistent (HCC)    a. s/p TEE-guided DCCV on 08/20/2017; b. 06/2021 Recurrent Afib-->amio/TEE/DCCV.   CAD (coronary artery disease)    a. 01/2012 Cath: nonobs dzs; b. 04/2014 MV: No  ischemia; c. 09/2017 Cath: Minimal nonobs RCA dzs, otw nl cors.   Cardiorenal syndrome    a. 09/2017 Creat rose to 2.7 w/ diuresis-->HD x 2.   Chronic back pain    Chronic combined systolic (congestive) and diastolic (congestive) heart failure (HCC)    a. Dx 2005 @ [facility];  b. 02/2014 Echo: EF 55-60%; c. 07/2017: EF 30-35%; d. 09/2017 Echo: EF 40-45%; e. 02/2018 Echo: EF 55-60%; f. 08/2019 Echo: EF 60-65%; g. 08/2020 RHC: RA 3, RV 30/1, PA 33/11 (20), PCWP 6; f. 08/2021 Echo: EF 60-65%, no rwma, GrIDD, nl RV fxn, RVSP 40.82mmHg, mod dil LA, mod TR.   Chronic respiratory failure (HCC)    a. on home O2   Gastritis    History of COVID-19    History of endocarditis 08/22/2021   History of intracranial hemorrhage    a. 6/14 described by neurosurgery as "small frontal posttraumatic SAH " - pt was on xarelto @ the time.   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 Chronic LLE edema.   NICM (nonischemic cardiomyopathy) (HCC)    a. 09/2017 Echo: EF 40-45%; b. 09/2017 Cath: minor, insignificant CAD; c. 02/2018 Echo: EF 55-60%, Gr1 DD; d. 08/2019 Echo: EF 60-65%, Gr2 DD.   Obesity    PAH (pulmonary artery hypertension) (HCC)    a. 09/2017 Echo: PASP ; b. 09/2017 RHC: RA 23, RV 84/13, PA 84/29, PCWP 20, CO 3.89, CI 1.89. LVEDP 18; c. 08/2020 RHC: RA 3, RV 30/1, PA 33/11 (20), PCWP 6. CO/CI 6.18/3.36. PVR 2.3 WU.   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 06/2021. Veg on PPM lead. Not candidate for extraction-->s/p 6 wks abx.   Sepsis with metabolic encephalopathy (HCC)    Sleep apnea    Streptococcal bacteremia    Stroke [facility]) 2014   Subarachnoid hemorrhage (HCC) 01/19/2013   Symptomatic bradycardia    a. s/p BSX PPM.   Thyroid nodule    Type II diabetes mellitus (HCC)    Urinary incontinence    Past Surgical History:   Procedure Laterality Date   ABDOMINAL HYSTERECTOMY  1969   BREAST BIOPSY Right 10/28/2019   lymph node bx, [place] Bx, pending path    CARDIAC CATHETERIZATION  2013   @ [facility]: No obstructive CAD: Only 20% ostial left circumflex.   CARDIOVERSION N/A 10/12/2017   Procedure: CARDIOVERSION;  Surgeon: Antonieta Iba, MD;  Location: [facility] ORS;  Service: Cardiovascular;  Laterality: N/A;   CARDIOVERSION N/A 07/04/2021   Procedure: CARDIOVERSION;  Surgeon: Laurey Morale, MD;  Location: [facility] ENDOSCOPY;  Service: Cardiovascular;  Laterality: N/A;  CATARACT EXTRACTION W/PHACO Right 09/19/2020   Procedure: CATARACT EXTRACTION PHACO AND INTRAOCULAR LENS PLACEMENT (IOC) RIGHT DIABETIC 3.95 00:43.0 9.2%;  Surgeon: Lockie Mola, MD;  Location: [facility] SURGERY CNTR;  Service: Ophthalmology;  Laterality: Right;  Pt requests to be last sleep apnea   CATARACT EXTRACTION W/PHACO Left 10/03/2020   Procedure: CATARACT EXTRACTION PHACO AND INTRAOCULAR LENS PLACEMENT (IOC) LEFT DIABETIC;  Surgeon: Lockie Mola, MD;  Location: [facility] SURGERY CNTR;  Service: Ophthalmology;  Laterality: Left;  3.25 0:59.3 5.5%   CHOLECYSTECTOMY     COLONOSCOPY WITH PROPOFOL N/A 09/10/2018   Procedure: COLONOSCOPY WITH PROPOFOL;  Surgeon: Scot Jun, MD;  Location: [facility] ENDOSCOPY;  Service: Endoscopy;  Laterality: N/A;   COLONOSCOPY WITH PROPOFOL N/A 10/22/2022   Procedure: COLONOSCOPY WITH PROPOFOL;  Surgeon: Jaynie Collins, DO;  Location: [facility] ENDOSCOPY;  Service: Gastroenterology;  Laterality: N/A;   CYST REMOVAL TRUNK  2002   BACK   DIALYSIS/PERMA CATHETER INSERTION N/A 10/06/2017   Procedure: DIALYSIS/PERMA CATHETER INSERTION;  Surgeon: Renford Dills, MD;  Location: [facility] INVASIVE CV LAB;  Service: Cardiovascular;  Laterality: N/A;   ESOPHAGOGASTRODUODENOSCOPY (EGD) WITH PROPOFOL N/A 10/21/2022   Procedure: ESOPHAGOGASTRODUODENOSCOPY (EGD) WITH PROPOFOL;  Surgeon: Jaynie Collins, DO;  Location: [facility]  ENDOSCOPY;  Service: Gastroenterology;  Laterality: N/A;   GIVENS CAPSULE STUDY N/A 10/22/2022   Procedure: GIVENS CAPSULE STUDY;  Surgeon: Jaynie Collins, DO;  Location: [facility] ENDOSCOPY;  Service: Gastroenterology;  Laterality: N/A;  IF NOTHING IS FOUND WITH COLONOSCOPY   INSERT / REPLACE / REMOVE PACEMAKER     lipoma removal  2014   back   PACEMAKER INSERTION  2015   [employer] dual chamber pacemaker implanted by Dr Graciela Husbands for symptomatic bradycardia   PERMANENT PACEMAKER INSERTION N/A 01/09/2014   Procedure: PERMANENT PACEMAKER INSERTION;  Surgeon: [facility] Salvia, MD;  Location: [facility] CATH LAB;  Service: Cardiovascular;  Laterality: N/A;   RIGHT HEART CATH N/A 04/19/2018   Procedure: RIGHT HEART CATH;  Surgeon: Laurey Morale, MD;  Location: [facility] INVASIVE CV LAB;  Service: Cardiovascular;  Laterality: N/A;   RIGHT HEART CATH N/A 08/30/2020   Procedure: RIGHT HEART CATH;  Surgeon: Laurey Morale, MD;  Location: [facility] INVASIVE CV LAB;  Service: Cardiovascular;  Laterality: N/A;   RIGHT HEART CATH N/A 02/11/2022   Procedure: RIGHT HEART CATH;  Surgeon: Laurey Morale, MD;  Location: [facility] INVASIVE CV LAB;  Service: Cardiovascular;  Laterality: N/A;   RIGHT/LEFT HEART CATH AND CORONARY ANGIOGRAPHY N/A 10/19/2017   Procedure: RIGHT/LEFT HEART CATH AND CORONARY ANGIOGRAPHY;  Surgeon: Iran Ouch, MD;  Location: [facility] INVASIVE CV LAB;  Service: Cardiovascular;  Laterality: N/A;   TEE WITHOUT CARDIOVERSION N/A 08/20/2017   Procedure: TRANSESOPHAGEAL ECHOCARDIOGRAM (TEE) & Direct current cardioversion;  Surgeon: Antonieta Iba, MD;  Location: [facility] ORS;  Service: Cardiovascular;  Laterality: N/A;   TEE WITHOUT CARDIOVERSION N/A 06/28/2021   Procedure: TRANSESOPHAGEAL ECHOCARDIOGRAM (TEE);  Surgeon: Antonieta Iba, MD;  Location: [facility] ORS;  Service: Cardiovascular;  Laterality: N/A;   TEE WITHOUT CARDIOVERSION N/A 07/04/2021   Procedure: TRANSESOPHAGEAL ECHOCARDIOGRAM (TEE);  Surgeon: Laurey Morale, MD;  Location: [facility] ENDOSCOPY;  Service: Cardiovascular;  Laterality: N/A;   TRIGGER FINGER RELEASE     Family History  Problem Relation Age of Onset   Heart disease Mother    Hypertension Mother    COPD Mother        was a smoker   Thyroid disease Mother    Hypertension Father  Hypertension Sister    Thyroid disease Sister    Breast cancer Sister    Hypertension Sister    Thyroid disease Sister    Bone cancer Sister    Stroke Son    Breast cancer Maternal Aunt    Kidney disease Neg Hx    Bladder Cancer Neg Hx    Social History   Socioeconomic History   Marital status: Divorced    Spouse name: Not on file   Number of children: 4   Years of education: 12   Highest education level: Associate degree: occupational, Scientist, product/process development, or vocational program  Occupational History   Occupation: Retired- factory work    Comment: exposed to Interior and spatial designer  Tobacco Use   Smoking status: Former    Current packs/day: 0.00    Average packs/day: 0.3 packs/day for 2.0 years (0.6 ttl pk-yrs)    Types: Cigarettes    Start date: 06/23/1988    Quit date: 06/23/1990    Years since quitting: 32.7    Passive exposure: Past   Smokeless tobacco: Never  Vaping Use   Vaping status: Never Used  Substance and Sexual Activity   Alcohol use: No   Drug use: No   Sexual activity: Not Currently    Birth control/protection: Surgical  Other Topics Concern   Not on file  Social History Narrative   Lives in [place] with daughter. Has 4 children. No pets.      Work - Hotel manager, retired      Diet - regular      02/14/2019: reports has Meals On Wheels delivered to her home;       Remote hx of smoking > 30 years; No alcohol-    Social Determinants of Health   Financial Resource Strain: Low Risk  (03/09/2023)   Overall Financial Resource Strain (CARDIA)    Difficulty of Paying Living Expenses: Not hard at all  Food Insecurity: No Food Insecurity (03/09/2023)   Hunger Vital Sign    Worried  About Running Out of Food in the Last Year: Never true    Ran Out of Food in the Last Year: Never true  Transportation Needs: No Transportation Needs (03/09/2023)   PRAPARE - Administrator, Civil Service (Medical): No    Lack of Transportation (Non-Medical): No  Recent Concern: Transportation Needs - Unmet Transportation Needs (02/26/2023)   PRAPARE - Administrator, Civil Service (Medical): Yes    Lack of Transportation (Non-Medical): No  Physical Activity: Inactive (03/09/2023)   Exercise Vital Sign    Days of Exercise per Week: 0 days    Minutes of Exercise per Session: 0 min  Stress: No Stress Concern Present (03/09/2023)   [employer] of Occupational Health - Occupational Stress Questionnaire    Feeling of Stress : Not at all  Social Connections: Socially Isolated (03/09/2023)   Social Connection and Isolation Panel [NHANES]    Frequency of Communication with Friends and Family: More than three times a week    Frequency of Social Gatherings with Friends and Family: More than three times a week    Attends Religious Services: Never    Database administrator or Organizations: No    Attends Engineer, structural: Never    Marital Status: Divorced    Tobacco Counseling Counseling given: Not Answered   Clinical Intake:  Pre-visit preparation completed: Yes  Pain : 0-10 Pain Score: 7  Pain Type: Chronic pain Pain Location: Chest Pain Orientation: Right Pain  Descriptors / Indicators: Aching Pain Onset: More than a month ago (off and on for 2-3 years treated with medication) Pain Frequency: Intermittent Pain Relieving Factors: medications prescribed  Pain Relieving Factors: medications prescribed  BMI - recorded: 40.04 Nutritional Status: BMI > 30  Obese Nutritional Risks: Nausea/ vomitting/ diarrhea (chronic diarrhea, for several years) Diabetes: Yes (controlled by diet) CBG done?: No Did pt. bring in CBG monitor from home?: No  How  often do you need to have someone help you when you read instructions, pamphlets, or other written materials from your doctor or pharmacy?: 1 - Never  Interpreter Needed?: No  Information entered by :: R. Kiondra Caicedo LPN   Activities of Daily Living    03/09/2023    1:43 PM 10/20/2022   12:00 PM  In your present state of health, do you have any difficulty performing the following activities:  Hearing? 0 0  Vision? 0 0  Comment readers   Difficulty concentrating or making decisions? 0 0  Walking or climbing stairs? 1 1  Comment walker   Dressing or bathing? 0 0  Doing errands, shopping? 1 0  Preparing Food and eating ? N   Using the Toilet? N   In the past six months, have you accidently leaked urine? Y   Comment depends   Do you have problems with loss of bowel control? Y   Comment chronic diarrhea   Managing your Medications? Y   Managing your Finances? Y   Housekeeping or managing your Housekeeping? Y     Patient Care Team: Glori Luis, MD as PCP - General (Family Medicine) Iran Ouch, MD as PCP - Cardiology (Cardiology) [facility] Salvia, MD as PCP - Electrophysiology (Cardiology) Carolee Rota, MD as Referring Physician (Allergy) Lupita Leash, MD (Inactive) as Referring Physician (Internal Medicine) Earna Coder, MD as Consulting Physician (Hematology and Oncology) Otho Ket, RN as [facility] Care Management  Indicate any recent Medical Services you may have received from other than [facility] providers in the past year (date may be approximate).     Assessment:   This is a routine wellness examination for Sariya.  Hearing/Vision screen Hearing Screening - Comments:: No issues Vision Screening - Comments:: Glasses to read   Goals Addressed             This Visit's Progress    Patient Stated       Try to eat better       Depression Screen    03/09/2023    1:52 PM 03/02/2023    3:52 PM 11/28/2022    4:46 PM 11/04/2022    11:45 AM 10/17/2022   11:15 AM 02/19/2022    2:16 PM 01/14/2022    1:54 PM  PHQ 2/9 Scores  PHQ - 2 Score 0 0 0 0 0 0 0  PHQ- 9 Score 5 9 0 6 0      Fall Risk    03/09/2023    1:48 PM 03/02/2023    3:52 PM 03/02/2023    3:51 PM 11/28/2022    4:45 PM 11/04/2022   11:38 AM  Fall Risk   Falls in the past year? 0 0 0 0 0  Number falls in past yr: 0 0 0 0 0  Injury with Fall? 0 0 0 0 0  Risk for fall due to : No Fall Risks No Fall Risks No Fall Risks No Fall Risks Impaired balance/gait;Impaired mobility;No Fall Risks  Follow up Falls prevention  discussed;Falls evaluation completed Falls evaluation completed Falls evaluation completed Falls evaluation completed Falls evaluation completed    MEDICARE RISK AT HOME: Medicare Risk at Home Any stairs in or around the home?: Yes If so, are there any without handrails?: No Home free of loose throw rugs in walkways, pet beds, electrical cords, etc?: No (advised to remove loose throw rugs) Adequate lighting in your home to reduce risk of falls?: Yes Life alert?: Yes Use of a cane, walker or w/c?: Yes (walker) Grab bars in the bathroom?: No Shower chair or bench in shower?: Yes Elevated toilet seat or a handicapped toilet?: No    Cognitive Function:        03/09/2023    2:02 PM 02/19/2022    2:22 PM 12/15/2019    2:32 PM 07/02/2018    2:26 PM 07/01/2017    1:43 PM  6CIT Screen  What Year? 0 points 0 points 0 points 0 points 0 points  What month? 0 points 0 points 0 points 0 points 0 points  What time? 0 points 0 points 0 points 0 points 0 points  Count back from 20 0 points 0 points  0 points 0 points  Months in reverse 0 points 0 points 0 points 0 points 0 points  Repeat phrase 0 points 0 points  0 points 0 points  Total Score 0 points 0 points  0 points 0 points    Immunizations Immunization History  Administered Date(s) Administered   Fluad Quad(high Dose 65+) 07/08/2019, 04/10/2020, 04/01/2021   Influenza, High Dose Seasonal PF  07/23/2016, 06/15/2017, 07/02/2018   Influenza, Seasonal, Injecte, Preservative Fre 07/27/2012   Influenza,inj,Quad PF,6+ Mos 06/10/2013, 05/01/2014, 03/19/2015   PFIZER(Purple Top)SARS-COV-2 Vaccination 09/23/2019, 10/17/2019   PPD Test 02/18/2013, 03/04/2013   Pneumococcal Conjugate-13 10/06/2013   Pneumococcal Polysaccharide-23 04/28/2012   Td 08/03/2013    TDAP status: Up to date  Flu Vaccine status: Due, Education has been provided regarding the importance of this vaccine. Advised may receive this vaccine at local pharmacy or Health Dept. Aware to provide a copy of the vaccination record if obtained from local pharmacy or Health Dept. Verbalized acceptance and understanding.  Pneumococcal vaccine status: Up to date  Covid-19 vaccine status: Information provided on how to obtain vaccines.   Qualifies for Shingles Vaccine? Yes   Zostavax completed No   Shingrix Completed?: No.    Education has been provided regarding the importance of this vaccine. Patient has been advised to call insurance company to determine out of pocket expense if they have not yet received this vaccine. Advised may also receive vaccine at local pharmacy or Health Dept. Verbalized acceptance and understanding.  Screening Tests Health Maintenance  Topic Date Due   Zoster Vaccines- Shingrix (1 of 2) Never done   Diabetic kidney evaluation - Urine ACR  07/07/2020   FOOT EXAM  11/03/2020   HEMOGLOBIN A1C  12/23/2021   Medicare Annual Wellness (AWV)  02/20/2023   COVID-19 Vaccine (3 - Pfizer risk series) 03/18/2023 (Originally 11/14/2019)   INFLUENZA VACCINE  09/21/2023 (Originally 01/22/2023)   DTaP/Tdap/Td (2 - Tdap) 08/04/2023   OPHTHALMOLOGY EXAM  12/09/2023   Diabetic kidney evaluation - eGFR measurement  02/03/2024   Pneumonia Vaccine 51+ Years old  Completed   DEXA SCAN  Completed   Hepatitis C Screening  Completed   HPV VACCINES  Aged Out   Colonoscopy  Discontinued    Health Maintenance  Health  Maintenance Due  Topic Date Due   Zoster Vaccines- Shingrix (  1 of 2) Never done   Diabetic kidney evaluation - Urine ACR  07/07/2020   FOOT EXAM  11/03/2020   HEMOGLOBIN A1C  12/23/2021   Medicare Annual Wellness (AWV)  02/20/2023    Colorectal cancer screening: No longer required.   Mammogram status: No longer required due to age.  Bone Density status: Completed 1/10. Results reflect: Bone density results: NORMAL. Repeat every 2 years. Per patient declines at this time  Lung Cancer Screening: (Low Dose CT Chest recommended if Age 94-80 years, 20 pack-year currently smoking OR have quit w/in 15years.) does not qualify.     Additional Screening:  Hepatitis C Screening: does qualify; Completed 1/19  Vision Screening: Recommended annual ophthalmology exams for early detection of glaucoma and other disorders of the eye. Is the patient up to date with their annual eye exam?  Yes  Who is the provider or what is the name of the office in which the patient attends annual eye exams? [facility] If pt is not established with a provider, would they like to be referred to a provider to establish care? No .   Dental Screening: Recommended annual dental exams for proper oral hygiene  Diabetic Foot Exam: Diabetic Foot Exam: Overdue, Pt has been advised about the importance in completing this exam. Pt is scheduled for diabetic foot exam on Needs to be completed and documented at next visit. Patient declines at this.Patient stated that she has too much going on at this time  Community Resource Referral / Chronic Care Management: CRR required this visit?  No   CCM required this visit?  No     Plan:     I have personally reviewed and noted the following in the patient's chart:   Medical and social history Use of alcohol, tobacco or illicit drugs  Current medications and supplements including opioid prescriptions. Patient is currently taking opioid prescriptions. Information provided to  patient regarding non-opioid alternatives. Patient advised to discuss non-opioid treatment plan with their provider. Functional ability and status Nutritional status Physical activity Advanced directives List of other physicians Hospitalizations, surgeries, and ER visits in previous 12 months Vitals Screenings to include cognitive, depression, and falls Referrals and appointments  In addition, I have reviewed and discussed with patient certain preventive protocols, quality metrics, and best practice recommendations. A written personalized care plan for preventive services as well as general preventive health recommendations were provided to patient.     Sydell Axon, LPN   [id number]   After Visit Summary: (MyChart) Due to this being a telephonic visit, the after visit summary with patients personalized plan was offered to patient via MyChart   Nurse Notes: None

## 2023-03-09 NOTE — Telephone Encounter (Signed)
Called patient to perform her Annual Medicare Wellness    FYI Patient complained of chest pain with it being at a level 7 today.  Patient stated that she has had this for years and sees a cardiologist.  Patient stated that she takes tylenol for the pain and occasionally will take Oxycodone. Patient stated that this is not new and she has been dealing with this for several years. Patient was advised if she develops any new symptoms she should go to the ER and she verbalized understanding.

## 2023-03-09 NOTE — Patient Instructions (Signed)
Debra Saunders , Thank you for taking time to come for your Medicare Wellness Visit. I appreciate your ongoing commitment to your health goals. Please review the following plan we discussed and let me know if I can assist you in the future.   Referrals/Orders/Follow-Ups/Clinician Recommendations: NoneManaging Pain Without Opioids Opioids are strong medicines used to treat moderate to severe pain.  For some people, especially those who have long-term (chronic) pain, opioids may not be the best choice for pain management due to: Side effects like nausea, constipation, and sleepiness. The risk of addiction (opioid use disorder). The longer you take opioids, the greater your risk of addiction. Pain that lasts for more than 3 months is called chronic pain. Managing chronic pain usually requires more than one approach and is often provided by a team of health care providers working together (multidisciplinary approach). Pain management may be done at a pain management center or pain clinic. How to manage pain without the use of opioids Use non-opioid medicines Non-opioid medicines for pain may include: Over-the-counter or prescription non-steroidal anti-inflammatory drugs (NSAIDs). These may be the first medicines used for pain. They work well for muscle and bone pain, and they reduce swelling. Acetaminophen. This over-the-counter medicine may work well for milder pain but not swelling. Antidepressants. These may be used to treat chronic pain. A certain type of antidepressant (tricyclics) is often used. These medicines are given in lower doses for pain than when used for depression. Anticonvulsants. These are usually used to treat seizures but may also reduce nerve (neuropathic) pain. Muscle relaxants. These relieve pain caused by sudden muscle tightening (spasms). You may also use a pain medicine that is applied to the skin as a patch, cream, or gel (topical analgesic), such as a numbing medicine. These may  cause fewer side effects than medicines taken by mouth. Do certain therapies as directed Some therapies can help with pain management. They include: Physical therapy. You will do exercises to gain strength and flexibility. A physical therapist may teach you exercises to move and stretch parts of your body that are weak, stiff, or painful. You can learn these exercises at physical therapy visits and practice them at home. Physical therapy may also involve: Massage. Heat wraps or applying heat or cold to affected areas. Electrical signals that interrupt pain signals (transcutaneous electrical nerve stimulation, TENS). Weak lasers that reduce pain and swelling (low-level laser therapy). Signals from your body that help you learn to regulate pain (biofeedback). Occupational therapy. This helps you to learn ways to function at home and work with less pain. Recreational therapy. This involves trying new activities or hobbies, such as a physical activity or drawing. Mental health therapy, including: Cognitive behavioral therapy (CBT). This helps you learn coping skills for dealing with pain. Acceptance and commitment therapy (ACT) to change the way you think and react to pain. Relaxation therapies, including muscle relaxation exercises and mindfulness-based stress reduction. Pain management counseling. This may be individual, family, or group counseling.  Receive medical treatments Medical treatments for pain management include: Nerve block injections. These may include a pain blocker and anti-inflammatory medicines. You may have injections: Near the spine to relieve chronic back or neck pain. Into joints to relieve back or joint pain. Into nerve areas that supply a painful area to relieve body pain. Into muscles (trigger point injections) to relieve some painful muscle conditions. A medical device placed near your spine to help block pain signals and relieve nerve pain or chronic back pain (spinal  cord  stimulation device). Acupuncture. Follow these instructions at home Medicines Take over-the-counter and prescription medicines only as told by your health care provider. If you are taking pain medicine, ask your health care providers about possible side effects to watch out for. Do not drive or use heavy machinery while taking prescription opioid pain medicine. Lifestyle  Do not use drugs or alcohol to reduce pain. If you drink alcohol, limit how much you have to: 0-1 drink a day for women who are not pregnant. 0-2 drinks a day for men. Know how much alcohol is in a drink. In the [place] one drink equals one 12 oz bottle of beer (355 mL), one 5 oz glass of wine (148 mL), or one 1 oz glass of hard liquor (44 mL). Do not use any products that contain nicotine or tobacco. These products include cigarettes, chewing tobacco, and vaping devices, such as e-cigarettes. If you need help quitting, ask your health care provider. Eat a healthy diet and maintain a healthy weight. Poor diet and excess weight may make pain worse. Eat foods that are high in fiber. These include fresh fruits and vegetables, whole grains, and beans. Limit foods that are high in fat and processed sugars, such as fried and sweet foods. Exercise regularly. Exercise lowers stress and may help relieve pain. Ask your health care provider what activities and exercises are safe for you. If your health care provider approves, join an exercise class that combines movement and stress reduction. Examples include yoga and tai chi. Get enough sleep. Lack of sleep may make pain worse. Lower stress as much as possible. Practice stress reduction techniques as told by your therapist. General instructions Work with all your pain management providers to find the treatments that work best for you. You are an important member of your pain management team. There are many things you can do to reduce pain on your own. Consider joining an online or  in-person support group for people who have chronic pain. Keep all follow-up visits. This is important. Where to find more information You can find more information about managing pain without opioids from: American Academy of Pain Medicine: painmed.org Institute for Chronic Pain: instituteforchronicpain.org American Chronic Pain Association: theacpa.org Contact a health care provider if: You have side effects from pain medicine. Your pain gets worse or does not get better with treatments or home therapy. You are struggling with anxiety or depression. Summary Many types of pain can be managed without opioids. Chronic pain may respond better to pain management without opioids. Pain is best managed when you and a team of health care providers work together. Pain management without opioids may include non-opioid medicines, medical treatments, physical therapy, mental health therapy, and lifestyle changes. Tell your health care providers if your pain gets worse or is not being managed well enough. This information is not intended to replace advice given to you by your health care provider. Make sure you discuss any questions you have with your health care provider. Document Revised: 09/19/2020 Document Reviewed: 09/19/2020 Elsevier Patient Education  2024 [employer]   This is a list of the screening recommended for you and due dates:  Health Maintenance  Topic Date Due   Zoster (Shingles) Vaccine (1 of 2) Never done   Yearly kidney health urinalysis for diabetes  07/07/2020   Complete foot exam   11/03/2020   Hemoglobin A1C  12/23/2021   COVID-19 Vaccine (3 - Pfizer risk series) 03/18/2023*   Flu Shot  09/21/2023*   DTaP/Tdap/Td  vaccine (2 - Tdap) 08/04/2023   Eye exam for diabetics  12/09/2023   Yearly kidney function blood test for diabetes  02/03/2024   Medicare Annual Wellness Visit  03/08/2024   Pneumonia Vaccine  Completed   DEXA scan (bone density measurement)  Completed    Hepatitis C Screening  Completed   HPV Vaccine  Aged Out   Colon Cancer Screening  Discontinued  *Topic was postponed. The date shown is not the original due date.    Advanced directives: (Declined) Advance directive discussed with you today. Even though you declined this today, please call our office should you change your mind, and we can give you the proper paperwork for you to fill out.  Next Medicare Annual Wellness Visit scheduled for next year: Yes 03/15/24 @ 1:30

## 2023-03-10 NOTE — Telephone Encounter (Signed)
Unfortunately there is not much else that can be done long-term for the costochondritis.  She cannot take any NSAIDs given that she is on Eliquis.  We could do prednisone short-term though that is not something that can be done recurrently.  We cannot do any tramadol given her history of a seizure many years ago.  It looks like she has tried oxycodone for it and that has not been terribly beneficial.  We could have her see orthopedics and see if they have anything else to offer.

## 2023-03-10 NOTE — Telephone Encounter (Signed)
Noted.  Please follow-up with her and see if this is improved from yesterday.  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  It sounds like this is consistent with her history.

## 2023-03-10 NOTE — Telephone Encounter (Signed)
Patient's daughter states the Patient was complaining of side pain on Saturday evening then Sunday she rested in bed all day and took Tylenol. The Patient mixed the powder up Saturday night to drink it and states she mixed it wrong. Patient did tell her Daughter she felt better yesterday and got up and did a little around the house yesterday. Daughter would like to know if her Mother can do anything besides the Tylenol for the costochonidritis?

## 2023-03-10 NOTE — Telephone Encounter (Signed)
Patient's daughter will talk with her Mother to find out if she wants to see Ortho for this and let us know.

## 2023-03-12 ENCOUNTER — Encounter: Payer: Self-pay | Admitting: Infectious Diseases

## 2023-03-12 ENCOUNTER — Ambulatory Visit: Payer: Medicare HMO | Attending: Infectious Diseases | Admitting: Infectious Diseases

## 2023-03-12 VITALS — BP 150/70 | HR 60 | Temp 97.9°F

## 2023-03-12 DIAGNOSIS — N189 Chronic kidney disease, unspecified: Secondary | ICD-10-CM | POA: Diagnosis not present

## 2023-03-12 DIAGNOSIS — I4891 Unspecified atrial fibrillation: Secondary | ICD-10-CM | POA: Diagnosis not present

## 2023-03-12 DIAGNOSIS — Z7901 Long term (current) use of anticoagulants: Secondary | ICD-10-CM | POA: Diagnosis not present

## 2023-03-12 DIAGNOSIS — I2721 Secondary pulmonary arterial hypertension: Secondary | ICD-10-CM | POA: Diagnosis not present

## 2023-03-12 DIAGNOSIS — I5042 Chronic combined systolic (congestive) and diastolic (congestive) heart failure: Secondary | ICD-10-CM | POA: Diagnosis not present

## 2023-03-12 DIAGNOSIS — I132 Hypertensive heart and chronic kidney disease with heart failure and with stage 5 chronic kidney disease, or end stage renal disease: Secondary | ICD-10-CM | POA: Insufficient documentation

## 2023-03-12 DIAGNOSIS — B954 Other streptococcus as the cause of diseases classified elsewhere: Secondary | ICD-10-CM | POA: Diagnosis not present

## 2023-03-12 DIAGNOSIS — E1122 Type 2 diabetes mellitus with diabetic chronic kidney disease: Secondary | ICD-10-CM | POA: Diagnosis not present

## 2023-03-12 DIAGNOSIS — G4733 Obstructive sleep apnea (adult) (pediatric): Secondary | ICD-10-CM | POA: Diagnosis not present

## 2023-03-12 DIAGNOSIS — E785 Hyperlipidemia, unspecified: Secondary | ICD-10-CM | POA: Insufficient documentation

## 2023-03-12 DIAGNOSIS — J449 Chronic obstructive pulmonary disease, unspecified: Secondary | ICD-10-CM | POA: Insufficient documentation

## 2023-03-12 DIAGNOSIS — I130 Hypertensive heart and chronic kidney disease with heart failure and stage 1 through stage 4 chronic kidney disease, or unspecified chronic kidney disease: Secondary | ICD-10-CM | POA: Diagnosis not present

## 2023-03-12 DIAGNOSIS — Z8619 Personal history of other infectious and parasitic diseases: Secondary | ICD-10-CM | POA: Diagnosis not present

## 2023-03-12 DIAGNOSIS — J9611 Chronic respiratory failure with hypoxia: Secondary | ICD-10-CM | POA: Diagnosis not present

## 2023-03-12 DIAGNOSIS — Z950 Presence of cardiac pacemaker: Secondary | ICD-10-CM | POA: Insufficient documentation

## 2023-03-12 DIAGNOSIS — R7881 Bacteremia: Secondary | ICD-10-CM | POA: Insufficient documentation

## 2023-03-12 DIAGNOSIS — I330 Acute and subacute infective endocarditis: Secondary | ICD-10-CM

## 2023-03-12 DIAGNOSIS — D631 Anemia in chronic kidney disease: Secondary | ICD-10-CM | POA: Diagnosis not present

## 2023-03-12 DIAGNOSIS — N186 End stage renal disease: Secondary | ICD-10-CM | POA: Diagnosis not present

## 2023-03-12 DIAGNOSIS — Z09 Encounter for follow-up examination after completed treatment for conditions other than malignant neoplasm: Secondary | ICD-10-CM

## 2023-03-12 DIAGNOSIS — J849 Interstitial pulmonary disease, unspecified: Secondary | ICD-10-CM | POA: Diagnosis not present

## 2023-03-12 DIAGNOSIS — J8410 Pulmonary fibrosis, unspecified: Secondary | ICD-10-CM | POA: Diagnosis not present

## 2023-03-12 MED ORDER — CEFADROXIL 500 MG PO CAPS: 500.0000 mg | ORAL_CAPSULE | Freq: Two times a day (BID) | ORAL | 12 refills | Status: DC

## 2023-03-12 NOTE — Patient Instructions (Signed)
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

## 2023-03-12 NOTE — Progress Notes (Signed)
NAME: Debra Saunders  DOB: 03-Aug-1945  MRN: [id number]  Date/Time: 03/12/2023 12:05 PM  Subjective:   ? Debra Saunders is a 77 y.o. female with a history of hypertension, hyperlipidemia, sinoatrial node dysfunction with pacemaker in place, OSA, interstitial lung disease, pulmonary arterial hypertension, CHF, atrial fibrillation on Eliquis, diabetes mellitus, CKD, cardiorenal syndrome, DVT on Eliquis , iStreptococcus Group G bacteremia in Jan 2023 with a large mobile vegetation of the RV pacemaker wire, treated with 6 weeks of IV ceftriaxone- As the pacemaker wire could not be removed because of underlying comorbidities she is on suppressive antibiotic therapy with cefadroxil indefinitely She is here for follow up, daughter with her Doing okay Home oxygen Dr.Klein planning to change the pacemaker ( due to recall)  Past Medical History:  Diagnosis Date   Arthritis    Atrial fibrillation, persistent (HCC)    a. s/p TEE-guided DCCV on 08/20/2017; b. 06/2021 Recurrent Afib-->amio/TEE/DCCV.   CAD (coronary artery disease)    a. 01/2012 Cath: nonobs dzs; b. 04/2014 MV: No ischemia; c. 09/2017 Cath: Minimal nonobs RCA dzs, otw nl cors.   Cardiorenal syndrome    a. 09/2017 Creat rose to 2.7 w/ diuresis-->HD x 2.   Chronic back pain    Chronic combined systolic (congestive) and diastolic (congestive) heart failure (HCC)    a. Dx 2005 @ [facility];  b. 02/2014 Echo: EF 55-60%; c. 07/2017: EF 30-35%; d. 09/2017 Echo: EF 40-45%; e. 02/2018 Echo: EF 55-60%; f. 08/2019 Echo: EF 60-65%; g. 08/2020 RHC: RA 3, RV 30/1, PA 33/11 (20), PCWP 6; f. 08/2021 Echo: EF 60-65%, no rwma, GrIDD, nl RV fxn, RVSP 40.62mmHg, mod dil LA, mod TR.   Chronic respiratory failure (HCC)    a. on home O2   Gastritis    History of COVID-19    History of endocarditis 08/22/2021   History of intracranial hemorrhage    a. 6/14 described by neurosurgery as "small frontal posttraumatic SAH " - pt was on xarelto @ the time.   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 Chronic LLE edema.   NICM (nonischemic cardiomyopathy) (HCC)    a. 09/2017 Echo: EF 40-45%; b. 09/2017 Cath: minor, insignificant CAD; c. 02/2018 Echo: EF 55-60%, Gr1 DD; d. 08/2019 Echo: EF 60-65%, Gr2 DD.   Obesity    PAH (pulmonary artery hypertension) (HCC)    a. 09/2017 Echo: PASP ; b. 09/2017 RHC: RA 23, RV 84/13, PA 84/29, PCWP 20, CO 3.89, CI 1.89. LVEDP 18; c. 08/2020 RHC: RA 3, RV 30/1, PA 33/11 (20), PCWP 6. CO/CI 6.18/3.36. PVR 2.3 WU.   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 06/2021. Veg on PPM lead. Not candidate for extraction-->s/p 6 wks abx.   Sepsis with metabolic encephalopathy (HCC)    Sleep apnea    Streptococcal bacteremia    Stroke [facility]) 2014   Subarachnoid hemorrhage (HCC) 01/19/2013   Symptomatic bradycardia    a. s/p BSX PPM.   Thyroid nodule    Type II diabetes mellitus (HCC)    Urinary incontinence     Past Surgical History:  Procedure Laterality Date   ABDOMINAL HYSTERECTOMY  1969   BREAST BIOPSY Right 10/28/2019   lymph node bx, [place] Bx, pending path    CARDIAC CATHETERIZATION  2013   @  [facility]: No obstructive CAD: Only 20% ostial left circumflex.   CARDIOVERSION N/A 10/12/2017   Procedure: CARDIOVERSION;  Surgeon: Antonieta Iba, MD;  Location: [facility] ORS;  Service: Cardiovascular;  Laterality: N/A;   CARDIOVERSION N/A 07/04/2021   Procedure: CARDIOVERSION;  Surgeon: Laurey Morale, MD;  Location: [facility] ENDOSCOPY;  Service: Cardiovascular;  Laterality: N/A;   CATARACT EXTRACTION W/PHACO Right 09/19/2020   Procedure: CATARACT EXTRACTION PHACO AND INTRAOCULAR LENS PLACEMENT (IOC) RIGHT DIABETIC 3.95 00:43.0 9.2%;  Surgeon: Lockie Mola, MD;  Location: [facility] SURGERY CNTR;  Service: Ophthalmology;  Laterality: Right;  Pt requests to be last sleep apnea    CATARACT EXTRACTION W/PHACO Left 10/03/2020   Procedure: CATARACT EXTRACTION PHACO AND INTRAOCULAR LENS PLACEMENT (IOC) LEFT DIABETIC;  Surgeon: Lockie Mola, MD;  Location: [facility] SURGERY CNTR;  Service: Ophthalmology;  Laterality: Left;  3.25 0:59.3 5.5%   CHOLECYSTECTOMY     COLONOSCOPY WITH PROPOFOL N/A 09/10/2018   Procedure: COLONOSCOPY WITH PROPOFOL;  Surgeon: Scot Jun, MD;  Location: [facility] ENDOSCOPY;  Service: Endoscopy;  Laterality: N/A;   COLONOSCOPY WITH PROPOFOL N/A 10/22/2022   Procedure: COLONOSCOPY WITH PROPOFOL;  Surgeon: Jaynie Collins, DO;  Location: [facility] ENDOSCOPY;  Service: Gastroenterology;  Laterality: N/A;   CYST REMOVAL TRUNK  2002   BACK   DIALYSIS/PERMA CATHETER INSERTION N/A 10/06/2017   Procedure: DIALYSIS/PERMA CATHETER INSERTION;  Surgeon: Renford Dills, MD;  Location: [facility] INVASIVE CV LAB;  Service: Cardiovascular;  Laterality: N/A;   ESOPHAGOGASTRODUODENOSCOPY (EGD) WITH PROPOFOL N/A 10/21/2022   Procedure: ESOPHAGOGASTRODUODENOSCOPY (EGD) WITH PROPOFOL;  Surgeon: Jaynie Collins, DO;  Location: [facility] ENDOSCOPY;  Service: Gastroenterology;  Laterality: N/A;   GIVENS CAPSULE STUDY N/A 10/22/2022   Procedure: GIVENS CAPSULE STUDY;  Surgeon: Jaynie Collins, DO;  Location: [facility] ENDOSCOPY;  Service: Gastroenterology;  Laterality: N/A;  IF NOTHING IS FOUND WITH COLONOSCOPY   INSERT / REPLACE / REMOVE PACEMAKER     lipoma removal  2014   back   PACEMAKER INSERTION  2015   [employer] dual chamber pacemaker implanted by Dr Graciela Husbands for symptomatic bradycardia   PERMANENT PACEMAKER INSERTION N/A 01/09/2014   Procedure: PERMANENT PACEMAKER INSERTION;  Surgeon: [facility] Salvia, MD;  Location: [facility] CATH LAB;  Service: Cardiovascular;  Laterality: N/A;   RIGHT HEART CATH N/A 04/19/2018   Procedure: RIGHT HEART CATH;  Surgeon: Laurey Morale, MD;  Location: [facility] INVASIVE CV LAB;  Service: Cardiovascular;  Laterality: N/A;   RIGHT HEART  CATH N/A 08/30/2020   Procedure: RIGHT HEART CATH;  Surgeon: Laurey Morale, MD;  Location: [facility] INVASIVE CV LAB;  Service: Cardiovascular;  Laterality: N/A;   RIGHT HEART CATH N/A 02/11/2022   Procedure: RIGHT HEART CATH;  Surgeon: Laurey Morale, MD;  Location: [facility] INVASIVE CV LAB;  Service: Cardiovascular;  Laterality: N/A;   RIGHT/LEFT HEART CATH AND CORONARY ANGIOGRAPHY N/A 10/19/2017   Procedure: RIGHT/LEFT HEART CATH AND CORONARY ANGIOGRAPHY;  Surgeon: Iran Ouch, MD;  Location: [facility] INVASIVE CV LAB;  Service: Cardiovascular;  Laterality: N/A;   TEE WITHOUT CARDIOVERSION N/A 08/20/2017   Procedure: TRANSESOPHAGEAL ECHOCARDIOGRAM (TEE) & Direct current cardioversion;  Surgeon: Antonieta Iba, MD;  Location: [facility] ORS;  Service: Cardiovascular;  Laterality: N/A;   TEE WITHOUT CARDIOVERSION N/A 06/28/2021   Procedure: TRANSESOPHAGEAL ECHOCARDIOGRAM (TEE);  Surgeon: Antonieta Iba, MD;  Location: [facility] ORS;  Service: Cardiovascular;  Laterality: N/A;   TEE WITHOUT CARDIOVERSION N/A 07/04/2021   Procedure: TRANSESOPHAGEAL ECHOCARDIOGRAM (TEE);  Surgeon: Laurey Morale, MD;  Location: [facility] ENDOSCOPY;  Service: Cardiovascular;  Laterality: N/A;   TRIGGER FINGER RELEASE      Social History   Socioeconomic History   Marital status: Divorced    Spouse name: Not on file   Number of children: 4   Years of education: 12   Highest education level: Associate degree: occupational, Scientist, product/process development, or vocational program  Occupational History   Occupation: Retired- factory work    Comment: exposed to Interior and spatial designer  Tobacco Use   Smoking status: Former    Current packs/day: 0.00    Average packs/day: 0.3 packs/day for 2.0 years (0.6 ttl pk-yrs)    Types: Cigarettes    Start date: 06/23/1988    Quit date: 06/23/1990    Years since quitting: 32.7    Passive exposure: Past   Smokeless tobacco: Never  Vaping Use   Vaping status: Never Used  Substance and Sexual Activity   Alcohol use: No   Drug use:  No   Sexual activity: Not Currently    Birth control/protection: Surgical  Other Topics Concern   Not on file  Social History Narrative   Lives in [place] with daughter. Has 4 children. No pets.      Work - Hotel manager, retired      Diet - regular      02/14/2019: reports has Meals On Wheels delivered to her home;       Remote hx of smoking > 30 years; No alcohol-    Social Determinants of Health   Financial Resource Strain: Low Risk  (03/09/2023)   Overall Financial Resource Strain (CARDIA)    Difficulty of Paying Living Expenses: Not hard at all  Food Insecurity: No Food Insecurity (03/09/2023)   Hunger Vital Sign    Worried About Running Out of Food in the Last Year: Never true    Ran Out of Food in the Last Year: Never true  Transportation Needs: No Transportation Needs (03/09/2023)   PRAPARE - Administrator, Civil Service (Medical): No    Lack of Transportation (Non-Medical): No  Recent Concern: Transportation Needs - Unmet Transportation Needs (02/26/2023)   PRAPARE - Administrator, Civil Service (Medical): Yes    Lack of Transportation (Non-Medical): No  Physical Activity: Inactive (03/09/2023)   Exercise Vital Sign    Days of Exercise per Week: 0 days    Minutes of Exercise per Session: 0 min  Stress: No Stress Concern Present (03/09/2023)   [employer] of Occupational Health - Occupational Stress Questionnaire    Feeling of Stress : Not at all  Social Connections: Socially Isolated (03/09/2023)   Social Connection and Isolation Panel [NHANES]    Frequency of Communication with Friends and Family: More than three times a week    Frequency of Social Gatherings with Friends and Family: More than three times a week    Attends Religious Services: Never    Database administrator or Organizations: No    Attends Banker Meetings: Never    Marital Status: Divorced  Catering manager Violence: Not At Risk (03/09/2023)   Humiliation,  Afraid, Rape, and Kick questionnaire    Fear of Current or Ex-Partner: No    Emotionally Abused: No    Physically Abused: No    Sexually Abused: No    Family History  Problem Relation Age of Onset   Heart disease Mother    Hypertension Mother    COPD Mother        was  a smoker   Thyroid disease Mother    Hypertension Father    Hypertension Sister    Thyroid disease Sister    Breast cancer Sister    Hypertension Sister    Thyroid disease Sister    Bone cancer Sister    Stroke Son    Breast cancer Maternal Aunt    Kidney disease Neg Hx    Bladder Cancer Neg Hx    Allergies  Allergen Reactions   Aspirin Anaphylaxis and Hives    Pt states that she is unable to take because she had bleeding in her brain   Other Other (See Comments)    Pt states that she is unable to take blood thinners because she had bleeding in her brain; Blood Thinners-per doctor at [facility] (Xarelto)   I? Current Outpatient Medications  Medication Sig Dispense Refill   acetaminophen (TYLENOL) 325 MG tablet Take 325-650 mg by mouth every 6 (six) hours as needed for mild pain.     albuterol (ACCUNEB) 0.63 MG/3ML nebulizer solution Take 1 ampule by nebulization every 6 (six) hours as needed for wheezing.     apixaban (ELIQUIS) 5 MG TABS tablet Take 1 tablet (5 mg total) by mouth 2 (two) times daily. 180 tablet 3   atropine 1 % ophthalmic solution Place 1 drop into both eyes at bedtime.     cefadroxil (DURICEF) 500 MG capsule TAKE 1 CAPSULE BY MOUTH TWICE DAILY. APPOINTMENT NEEDED. 60 capsule 0   cholestyramine (QUESTRAN) 4 g packet Take 1 packet by mouth daily.     dapagliflozin propanediol (FARXIGA) 10 MG TABS tablet Take 1 tablet (10 mg total) by mouth daily before breakfast. 90 tablet 6   DULoxetine (CYMBALTA) 60 MG capsule Take 60 mg by mouth daily.     gabapentin (NEURONTIN) 600 MG tablet Take 600 mg by mouth daily. Patient states takes 1 tablet at bedtime.     lidocaine (LIDODERM) 5 % Place 2 patches  onto the skin daily. Remove & Discard patch within 12 hours or as directed by MD 30 patch 0   macitentan (OPSUMIT) 10 MG tablet Take 1 tablet (10 mg total) by mouth daily. 90 tablet 3   meclizine (ANTIVERT) 25 MG tablet Take 1 tablet (25 mg total) by mouth 3 (three) times daily as needed for dizziness. 30 tablet 1   Menthol, Topical Analgesic, (BIOFREEZE EX) Apply 1 application topically 4 (four) times daily as needed (for pain).     midodrine (PROAMATINE) 5 MG tablet Take one tablet at 12 noon, 4pm and 8pm 270 tablet 3   oxyCODONE (OXY IR/ROXICODONE) 5 MG immediate release tablet Take 1 tablet (5 mg total) by mouth every 4 (four) hours as needed for moderate pain, breakthrough pain or severe pain. 12 tablet 0   potassium chloride SA (KLOR-CON M) 20 MEQ tablet Take 40 mEq by mouth daily.     Selexipag (UPTRAVI) 1600 MCG TABS Take 1 tablet (1,600 mcg total) by mouth 2 (two) times daily. 60 tablet 11   sildenafil (REVATIO) 20 MG tablet Take 1 tablet (20 mg total) by mouth 3 (three) times daily. 90 tablet 3   torsemide (DEMADEX) 20 MG tablet Take 2 tablets (40 mg total) by mouth daily. 60 tablet 6   No current facility-administered medications for this visit.     Abtx:  Anti-infectives (From admission, onward)    None       REVIEW OF SYSTEMS:  Const: negative fever, negative chills, negative weight loss Eyes: negative  diplopia or visual changes, negative eye pain ENT: negative coryza, negative sore throat Resp: negative cough, hemoptysis, has dyspnea Cards: negative for chest pain, palpitations, has rt lower extremity edema GU: negative for frequency, dysuria and hematuria GI: Negative for abdominal pain, diarrhea, bleeding, constipation Skin: negative for rash and pruritus Heme: negative for easy bruising and gum/nose bleeding MS:  costochondral pain rt side of chest muscle weakness Neurolo:negative for headaches, dizziness, vertigo, memory problems  Psych: negative for feelings of  anxiety, depression  Endocrine: , diabetes Allergy/Immunology- as above Objective:  VITALS:  BP (!) 150/70   Pulse 60   Temp 97.9 F (36.6 C) (Temporal)   SpO2 93%   PHYSICAL EXAM:  General: Alert, cooperative, on nasal cannula oxygen, wheel chair Head: Normocephalic, without obvious abnormality, atraumatic. Eyes: Conjunctivae clear, anicteric sclerae. Pupils are equal ENT Nares normal. No drainage or sinus tenderness. Lips, mucosa, and tongue normal. No Thrush Neck: Supple, symmetrical, no adenopathy, thyroid: non tender no carotid bruit and no JVD. Back: No CVA tenderness. Lungs: b/l air entry Tender rt side of rib near sternum Heart: s1s2 Abdomen: did not examine Extremities: left ankle /foot edema Skin: No rashes or lesions. Or bruising Lymph: Cervical, supraclavicular normal. Neurologic: Grossly non-focal Pertinent Labs Lab Results CBC    Component Value Date/Time   WBC 5.0 02/03/2023 1612   WBC 5.5 01/19/2023 1432   WBC 4.7 11/04/2022 1144   RBC 3.75 (L) 02/03/2023 1612   RBC 3.80 (L) 01/19/2023 1432   HGB 10.0 (L) 02/03/2023 1612   HCT 32.6 (L) 02/03/2023 1612   PLT 198 02/03/2023 1612   MCV 87 02/03/2023 1612   MCV 85 09/12/2014 1753   MCH 26.7 02/03/2023 1612   MCH 27.1 01/19/2023 1432   MCHC 30.7 (L) 02/03/2023 1612   MCHC 29.6 (L) 01/19/2023 1432   RDW 12.0 02/03/2023 1612   RDW 14.0 09/12/2014 1753   LYMPHSABS 1.1 01/19/2023 1432   LYMPHSABS 1.8 04/02/2018 1548   LYMPHSABS 1.8 12/28/2013 1300   MONOABS 0.3 01/19/2023 1432   MONOABS 0.4 12/28/2013 1300   EOSABS 0.2 01/19/2023 1432   EOSABS 0.2 04/02/2018 1548   EOSABS 0.2 12/28/2013 1300   BASOSABS 0.0 01/19/2023 1432   BASOSABS 0.1 04/02/2018 1548   BASOSABS 0.1 12/28/2013 1300       Latest Ref Rng & Units 02/03/2023    4:12 PM 01/19/2023    2:33 PM 12/19/2022   12:36 PM  CMP  Glucose 70 - 99 mg/dL 95  782  956   BUN 8 - 27 mg/dL 23  34  15   Creatinine 0.57 - 1.00 mg/dL 2.13  0.86  5.78    Sodium 134 - 144 mmol/L 144  139  140   Potassium 3.5 - 5.2 mmol/L 4.4  4.6  3.6   Chloride 96 - 106 mmol/L 108  103  102   CO2 20 - 29 mmol/L 21  28  29    Calcium 8.7 - 10.3 mg/dL 9.2  9.0  8.9      ? Impression/Recommendation H/o ?Streptococcus Group G bacteremia with pacemaker wire vegetation. After Iv ceftriaxone for 6 weeks has been on suppressive therapy with cefadroxil 500mg  BID as wires could not be removed Continue same antibiotic indefinitely  SA node dysfunction- has pacemaker Afib on eliquis  Acute on chronic resp failure sec to ILD/COPD/OSA PAH  CHF   ? ? ___________________________________________________ Discussed with patient,and daughter Follow up 1year Note:  This document was prepared using Dragon voice  recognition software and may include unintentional dictation errors.

## 2023-03-16 ENCOUNTER — Other Ambulatory Visit (HOSPITAL_COMMUNITY): Payer: Self-pay | Admitting: Cardiology

## 2023-03-16 DIAGNOSIS — E785 Hyperlipidemia, unspecified: Secondary | ICD-10-CM

## 2023-03-17 DIAGNOSIS — I5042 Chronic combined systolic (congestive) and diastolic (congestive) heart failure: Secondary | ICD-10-CM | POA: Diagnosis not present

## 2023-03-17 DIAGNOSIS — I4891 Unspecified atrial fibrillation: Secondary | ICD-10-CM | POA: Diagnosis not present

## 2023-03-17 DIAGNOSIS — I130 Hypertensive heart and chronic kidney disease with heart failure and stage 1 through stage 4 chronic kidney disease, or unspecified chronic kidney disease: Secondary | ICD-10-CM | POA: Diagnosis not present

## 2023-03-17 DIAGNOSIS — J9611 Chronic respiratory failure with hypoxia: Secondary | ICD-10-CM | POA: Diagnosis not present

## 2023-03-17 DIAGNOSIS — D631 Anemia in chronic kidney disease: Secondary | ICD-10-CM | POA: Diagnosis not present

## 2023-03-17 DIAGNOSIS — J449 Chronic obstructive pulmonary disease, unspecified: Secondary | ICD-10-CM | POA: Diagnosis not present

## 2023-03-17 DIAGNOSIS — E1122 Type 2 diabetes mellitus with diabetic chronic kidney disease: Secondary | ICD-10-CM | POA: Diagnosis not present

## 2023-03-17 DIAGNOSIS — J8410 Pulmonary fibrosis, unspecified: Secondary | ICD-10-CM | POA: Diagnosis not present

## 2023-03-17 DIAGNOSIS — N189 Chronic kidney disease, unspecified: Secondary | ICD-10-CM | POA: Diagnosis not present

## 2023-03-18 DIAGNOSIS — I5042 Chronic combined systolic (congestive) and diastolic (congestive) heart failure: Secondary | ICD-10-CM | POA: Diagnosis not present

## 2023-03-18 DIAGNOSIS — D631 Anemia in chronic kidney disease: Secondary | ICD-10-CM | POA: Diagnosis not present

## 2023-03-18 DIAGNOSIS — N189 Chronic kidney disease, unspecified: Secondary | ICD-10-CM | POA: Diagnosis not present

## 2023-03-18 DIAGNOSIS — I130 Hypertensive heart and chronic kidney disease with heart failure and stage 1 through stage 4 chronic kidney disease, or unspecified chronic kidney disease: Secondary | ICD-10-CM | POA: Diagnosis not present

## 2023-03-18 DIAGNOSIS — I4891 Unspecified atrial fibrillation: Secondary | ICD-10-CM | POA: Diagnosis not present

## 2023-03-18 DIAGNOSIS — E1122 Type 2 diabetes mellitus with diabetic chronic kidney disease: Secondary | ICD-10-CM | POA: Diagnosis not present

## 2023-03-18 DIAGNOSIS — J449 Chronic obstructive pulmonary disease, unspecified: Secondary | ICD-10-CM | POA: Diagnosis not present

## 2023-03-19 DIAGNOSIS — J8410 Pulmonary fibrosis, unspecified: Secondary | ICD-10-CM | POA: Diagnosis not present

## 2023-03-19 DIAGNOSIS — E1122 Type 2 diabetes mellitus with diabetic chronic kidney disease: Secondary | ICD-10-CM | POA: Diagnosis not present

## 2023-03-19 DIAGNOSIS — I5042 Chronic combined systolic (congestive) and diastolic (congestive) heart failure: Secondary | ICD-10-CM | POA: Diagnosis not present

## 2023-03-19 DIAGNOSIS — I130 Hypertensive heart and chronic kidney disease with heart failure and stage 1 through stage 4 chronic kidney disease, or unspecified chronic kidney disease: Secondary | ICD-10-CM | POA: Diagnosis not present

## 2023-03-19 DIAGNOSIS — I4891 Unspecified atrial fibrillation: Secondary | ICD-10-CM | POA: Diagnosis not present

## 2023-03-19 DIAGNOSIS — D631 Anemia in chronic kidney disease: Secondary | ICD-10-CM | POA: Diagnosis not present

## 2023-03-19 DIAGNOSIS — J449 Chronic obstructive pulmonary disease, unspecified: Secondary | ICD-10-CM | POA: Diagnosis not present

## 2023-03-19 DIAGNOSIS — J9611 Chronic respiratory failure with hypoxia: Secondary | ICD-10-CM | POA: Diagnosis not present

## 2023-03-19 DIAGNOSIS — N189 Chronic kidney disease, unspecified: Secondary | ICD-10-CM | POA: Diagnosis not present

## 2023-03-23 ENCOUNTER — Encounter: Payer: Self-pay | Admitting: Cardiology

## 2023-03-23 ENCOUNTER — Ambulatory Visit (HOSPITAL_BASED_OUTPATIENT_CLINIC_OR_DEPARTMENT_OTHER): Payer: Medicare HMO | Admitting: Cardiology

## 2023-03-23 ENCOUNTER — Other Ambulatory Visit
Admission: RE | Admit: 2023-03-23 | Discharge: 2023-03-23 | Disposition: A | Discharge status: Home / Self Care | Payer: Medicare HMO | Source: Ambulatory Visit | Attending: Cardiology | Admitting: Cardiology

## 2023-03-23 VITALS — BP 104/62 | HR 70 | Resp 16 | Wt 203.2 lb

## 2023-03-23 DIAGNOSIS — N183 Chronic kidney disease, stage 3 unspecified: Secondary | ICD-10-CM | POA: Insufficient documentation

## 2023-03-23 DIAGNOSIS — G4733 Obstructive sleep apnea (adult) (pediatric): Secondary | ICD-10-CM | POA: Insufficient documentation

## 2023-03-23 DIAGNOSIS — E1122 Type 2 diabetes mellitus with diabetic chronic kidney disease: Secondary | ICD-10-CM | POA: Insufficient documentation

## 2023-03-23 DIAGNOSIS — I890 Lymphedema, not elsewhere classified: Secondary | ICD-10-CM | POA: Diagnosis not present

## 2023-03-23 DIAGNOSIS — I2510 Atherosclerotic heart disease of native coronary artery without angina pectoris: Secondary | ICD-10-CM | POA: Insufficient documentation

## 2023-03-23 DIAGNOSIS — I480 Paroxysmal atrial fibrillation: Secondary | ICD-10-CM | POA: Diagnosis not present

## 2023-03-23 DIAGNOSIS — J449 Chronic obstructive pulmonary disease, unspecified: Secondary | ICD-10-CM | POA: Insufficient documentation

## 2023-03-23 DIAGNOSIS — Z950 Presence of cardiac pacemaker: Secondary | ICD-10-CM | POA: Insufficient documentation

## 2023-03-23 DIAGNOSIS — R7881 Bacteremia: Secondary | ICD-10-CM | POA: Diagnosis not present

## 2023-03-23 DIAGNOSIS — I495 Sick sinus syndrome: Secondary | ICD-10-CM | POA: Diagnosis not present

## 2023-03-23 DIAGNOSIS — Z8673 Personal history of transient ischemic attack (TIA), and cerebral infarction without residual deficits: Secondary | ICD-10-CM | POA: Insufficient documentation

## 2023-03-23 DIAGNOSIS — Z86718 Personal history of other venous thrombosis and embolism: Secondary | ICD-10-CM | POA: Insufficient documentation

## 2023-03-23 DIAGNOSIS — I5033 Acute on chronic diastolic (congestive) heart failure: Secondary | ICD-10-CM | POA: Insufficient documentation

## 2023-03-23 DIAGNOSIS — J8410 Pulmonary fibrosis, unspecified: Secondary | ICD-10-CM | POA: Diagnosis not present

## 2023-03-23 DIAGNOSIS — I272 Pulmonary hypertension, unspecified: Secondary | ICD-10-CM | POA: Insufficient documentation

## 2023-03-23 DIAGNOSIS — I5032 Chronic diastolic (congestive) heart failure: Secondary | ICD-10-CM

## 2023-03-23 DIAGNOSIS — Z9981 Dependence on supplemental oxygen: Secondary | ICD-10-CM | POA: Diagnosis not present

## 2023-03-23 DIAGNOSIS — I2690 Septic pulmonary embolism without acute cor pulmonale: Secondary | ICD-10-CM | POA: Diagnosis not present

## 2023-03-23 DIAGNOSIS — Z7901 Long term (current) use of anticoagulants: Secondary | ICD-10-CM | POA: Diagnosis not present

## 2023-03-23 DIAGNOSIS — I130 Hypertensive heart and chronic kidney disease with heart failure and stage 1 through stage 4 chronic kidney disease, or unspecified chronic kidney disease: Secondary | ICD-10-CM | POA: Diagnosis not present

## 2023-03-23 DIAGNOSIS — I951 Orthostatic hypotension: Secondary | ICD-10-CM | POA: Insufficient documentation

## 2023-03-23 LAB — CBC
HCT: 34 % — ABNORMAL LOW (ref 36.0–46.0)
Hemoglobin: 9.9 g/dL — ABNORMAL LOW (ref 12.0–15.0)
MCH: 27.7 pg (ref 26.0–34.0)
MCHC: 29.1 g/dL — ABNORMAL LOW (ref 30.0–36.0)
MCV: 95.2 fL (ref 80.0–100.0)
Platelets: 168 10*3/uL (ref 150–400)
RBC: 3.57 MIL/uL — ABNORMAL LOW (ref 3.87–5.11)
RDW: 12.8 % (ref 11.5–15.5)
WBC: 5 10*3/uL (ref 4.0–10.5)
nRBC: 0 % (ref 0.0–0.2)

## 2023-03-23 LAB — BASIC METABOLIC PANEL
Anion gap: 8 (ref 5–15)
BUN: 14 mg/dL (ref 8–23)
CO2: 27 mmol/L (ref 22–32)
Calcium: 8.9 mg/dL (ref 8.9–10.3)
Chloride: 107 mmol/L (ref 98–111)
Creatinine, Ser: 0.8 mg/dL (ref 0.44–1.00)
GFR, Estimated: >60 mL/min (ref >60)
Glucose, Bld: 107 mg/dL — ABNORMAL HIGH (ref 70–99)
Potassium: 4.4 mmol/L (ref 3.5–5.1)
Sodium: 142 mmol/L (ref 135–145)

## 2023-03-23 LAB — BRAIN NATRIURETIC PEPTIDE: B Natriuretic Peptide: 339.4 pg/mL — ABNORMAL HIGH (ref 0.0–100.0)

## 2023-03-23 MED ORDER — TORSEMIDE 20 MG PO TABS
ORAL_TABLET | ORAL | Status: DC
Start: 2023-03-23 — End: 2023-07-17

## 2023-03-23 MED ORDER — POTASSIUM CHLORIDE CRYS ER 20 MEQ PO TBCR: EXTENDED_RELEASE_TABLET | ORAL | Status: DC

## 2023-03-23 NOTE — Patient Instructions (Signed)
INCREASE Torsemide to 40mg  in the AM sand 20mg  in the PM  INCREASE Potassium to in the AM and in the PM  Your provider requests you have an echocardiogram  Routine lab work today. Will notify you of abnormal results  Repeat labs (bmet) in 10 days  Follow up in 6 weeks

## 2023-03-23 NOTE — Progress Notes (Signed)
Date:  03/23/2023   ID:  Debra Saunders, DOB May 13, 1946, MRN [id number]   Provider location: [facility] Type of Visit: Established patient   PCP:  Glori Luis, MD  Cardiologist:  [name], MD HF Cardiologist: Dr. Shirlee Latch  Chief Complaint: HF follow up   History of Present Illness: Debra Saunders is a 77 y.o. 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   Admitted 10/24-10/29/19 with acute on chronic diastolic HF. HF team consulted with concerns for low output. PICC was placed and milrinone to support RV function was started along with IV Lasix to facilitate diuresis. RHC done to evaluate PAH (see below). V/Q scan negative. High res CT showed pulmonary fibrosis. Pulmonary consult and recommended stopping amiodarone. She was transitioned to torsemide 60 mg BID. DC weight: 226 lbs.    She has started on selexipag for pulmonary hypertension in addition to Opsumit and Revatio.      Echo in 3/21 showed EF 60-65%, normal RV, PASP 50 mmHg, moderate-severe MR.   Had RHC 08/30/2020- low/normal filling pressures, mild pumonary htn, and preserved cardiac. output.   Admitted to [facility] 1/23 with endocarditis and PNA, requiring pressors. BCx positive for Streptococcus group G.  All her PAH meds were held. TEE showed a large vegetation on the pacer wire in the right atrium above the tricuspid valve (1 x 1.5 cm).  No vegetations noted on the valves.  EF was 55% and there was note of mild to moderate mitral regurgitation. RV ok. She was transferred to [facility] for pacemaker extraction. EP felt she was not a candidate due to multiple comorbidities. ID consulted  and planned for 6 weeks of IV abx. She developed Afib with RVR and started on amio. She underwent successful TEE-guided DCCV 07/14/21. Hospitalization complicated by anemia, hypotension, and AKI.  Midodrine started during admission. She was discharged to SNF, weight 198 lbs.  Admitted 3/23 with lightheadedness. Treated for sinus infection, HF meds held due to othostasis. Midodrine increased to 10 mg tid. HH PT arranged.  Repeat echo in 3/23 showed EF 60-65%, mild LVH, normal RV, IVC normal, no definite vegetation noted.   Follow up 8/23, continued with dyspnea, despite euvolemia. Amiodarone stopped, in case this was causing her symptoms, and arranged for RHC. RHC showed showed normal filling and PA pressures. Felt symptoms pulmonary-driven.  Today she returns for HF follow up with her daughter.  She has not felt well for about 2 wks, has felt palpitations/heart skipping.  She is noted today to be back in atrial fibrillation. She says that she has not missed any Eliquis doses. Her CPAP is broken and she has not been able to use it for 4-5 days.  She has slept very poorly and is tired.  Breathing worse recently, gets dyspnea walking long distances or up stairs. No orthopnea/PND. Weight is up 3 lbs.  She uses 4L home oxygen. No BRBPR/melena.  No chest pain.  ECG (personally reviewed): Atrial fibrillation with occasional v-pacing  6 minute walk (1/20): 98 m 6 minute walk (7/20): 213 m 6 minute walk (3/22): 198 m 6 minute walk (10/22): 229 m   Labs (11/19): digoxin 0.8 Labs (1/20): K 4.2, creatinine  1.36 => 1.33, digoxin 0.8 Labs (2/20): digoxin 0.2, K 4.3, creatinine 1.32 Labs (5/20): K 4.5, creatinine 1.4, digoxin 0.8 Labs (1/22): K 4, creatinine 1.07 Labs (8/22): K 4.1, creatinine 1.03 Labs (1/23): K 3.7, creatinine 1.29 Labs (1/23): K 4.7, creatinine 1.07 Labs (3/23): K 4.3, creatinine 1.24 => 1.07, hgb 9.4 Labs (6/23): BNP 158 Labs (7/23): K 3.9, creatinine 1.1 Labs (10/23): K 3.4,  creatinine 1.25, hgb 10 Labs (6/24): K 3.6, creatinine 0.93 Labs (8/24): K 4.4, creatinine 0.97   PMH: 1. Lymphedema: Left lower leg (chronic).  2. COPD 3. 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  - PFTs (2017) with FEV1 64%, FEV1/FVC ratio 106%, TLC 49%, DLCO 39% (restrictive).  - CT chest high resolution (10/19): Pulmonary parenchymal pattern of fibrosis.  - CT chest (4/22): Chronic ILD 4. Atrial fibrillation: Paroxysmal.  She was previously off amiodarone due to lung disease.  - 2/19 DCCV.  - 1/23 DCCV 5. H/o DVT s/p IVC filter in 2004.  6. CAD: 4/19 coronary angiography with minimal coronary disease.  7. Bradycardia: [employer] PPM.  8. H/o traumatic SAH 2014.  9. CKD: Stage 3.  10. H/o CVA 11. OSA: CPAP 12. HTN 13. Type II diabetes 14. Pulmonary hypertension:  - PFTs 2017 with severe restriction.  - Echo (10/19): EF 60-65%, PASP 52 mmHg - RHC (4/19): Severe PAH with PVR 7.2 WU.  - RHC (10/19): mean RA 6, PA 56/17 mean 33, PCWP mean 12, CI 3.23, PVR 3.4 WU.  - RHC (3/22): mean 3, RV 30/1, PA 33/11, mean 20, PCWP mean 6, Oxygen saturations: PA 69% AO 100%, Cardiac Output (Fick) 6.18, Cardiac Index (Fick) 3.36, and PVR 2.3 WU - V/Q scan (10/19): No chronic PE.  - Anti-SCL 70 and ANA negative.  ACE normal.  RF slightly elevated at 15.  - Echo (3/21): EF 60-65%, normal RV, PASP 50 mmHg, moderate-severe MR. - TEE (1/23): EF 60-65%, mild RV dilation with normal RV systolic function, MR mild-moderate, moderate TR with vegetation on TV and pacemaker lead - Echo (1/23): EF 60-65%, normal RV, RVSP 51.5 mmHg, mild-moderate MR, moderate TR - Echo (3/23): EF 60-65%, mild LVH, normal RV, IVC normal, no definite vegetation noted.  - RHC (8/23): RA 3, PA 29/10 (mean 16), PCWP 12, CO/CI (Fick) 6.18/3.37 15. 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  16. Mitral regurgitation: moderate to severe on 3/22 echo.  17. Zio patch (3/22): 9.3% PACs, no AF,  2.9% PVCs 18. 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  Deemed too high risk to remove device so have tried to treat through.  19. Fe deficiency anemia 20. B12 deficiency  FH: mother with CHF  Current Outpatient Medications  Medication Sig Dispense Refill   acetaminophen (TYLENOL) 325 MG tablet Take 325-650 mg by mouth every 6 (six) hours as needed for mild pain.     albuterol (ACCUNEB) 0.63 MG/3ML nebulizer solution Take 1 ampule by nebulization every 6 (six) hours as needed for wheezing.     apixaban (ELIQUIS) 5 MG TABS tablet Take 1 tablet (5 mg total) by mouth 2 (two) times daily. 180 tablet 3   atropine 1 % ophthalmic solution Place 1 drop into both eyes at bedtime.     cefadroxil (DURICEF) 500 MG capsule Take 1 capsule (500 mg total) by mouth 2 (two) times daily. 60 capsule 12   cholestyramine (QUESTRAN) 4 g packet Take 1 packet by mouth daily.     dapagliflozin propanediol (  FARXIGA) 10 MG TABS tablet Take 1 tablet (10 mg total) by mouth daily before breakfast. 90 tablet 6   gabapentin (NEURONTIN) 600 MG tablet Take 600 mg by mouth at bedtime.     lidocaine (LIDODERM) 5 % Place 2 patches onto the skin daily. Remove & Discard patch within 12 hours or as directed by MD (Patient taking differently: Place 2 patches onto the skin daily as needed (pain). Remove & Discard patch within 12 hours or as directed by MD) 30 patch 0   macitentan (OPSUMIT) 10 MG tablet Take 1 tablet (10 mg total) by mouth daily. 90 tablet 3   meclizine (ANTIVERT) 25 MG tablet Take 1 tablet (25 mg total) by mouth 3 (three) times daily as needed for dizziness. 30 tablet 1   Menthol, Topical Analgesic, (BIOFREEZE EX) Apply 1 application topically 4 (four) times daily as needed (for pain).     midodrine (PROAMATINE) 5 MG tablet Take one tablet at 12 noon, 4pm and 8pm 270 tablet 3   oxyCODONE (OXY IR/ROXICODONE) 5 MG immediate release tablet Take 1 tablet (5 mg total) by mouth every 4 (four) hours  as needed for moderate pain, breakthrough pain or severe pain. 12 tablet 0   rosuvastatin (CRESTOR) 10 MG tablet Take 1 tablet by mouth once daily 90 tablet 0   Selexipag (UPTRAVI) 1600 MCG TABS Take 1 tablet (1,600 mcg total) by mouth 2 (two) times daily. 60 tablet 11   sildenafil (REVATIO) 20 MG tablet Take 1 tablet (20 mg total) by mouth 3 (three) times daily. 90 tablet 3   potassium chloride SA (KLOR-CON M) 20 MEQ tablet Take every morning and 20mg  every evening.     torsemide (DEMADEX) 20 MG tablet Take 40mg  every morning and take 20mg  every evening.     No current facility-administered medications for this visit.   Allergies:   Aspirin and Other   Social History:  The patient  reports that she quit smoking about 32 years ago. Her smoking use included cigarettes. She started smoking about 34 years ago. She has a 0.6 pack-year smoking history. She has been exposed to tobacco smoke. She has never used smokeless tobacco. She reports that she does not drink alcohol and does not use drugs.   Family History:  The patient's family history includes Bone cancer in her sister; Breast cancer in her maternal aunt and sister; COPD in her mother; Heart disease in her mother; Hypertension in her father, mother, sister, and sister; Stroke in her son; Thyroid disease in her mother, sister, and sister.   ROS:  Please see the history of present illness.   All other systems are personally reviewed and negative.   Recent Labs: 10/25/2022: Magnesium 2.0 11/18/2022: ALT 15 03/23/2023: B Natriuretic Peptide 339.4; BUN 14; Creatinine, Ser 0.80; Hemoglobin 9.9; Platelets 168; Potassium 4.4; Sodium 142  Personally reviewed   Wt Readings from Last 3 Encounters:  03/23/23 203 lb 4 oz (92.2 kg)  03/09/23 205 lb (93 kg)  03/02/23 204 lb 3.2 oz (92.6 kg)    BP 104/62   Pulse 70   Resp 16   Wt 203 lb 4 oz (92.2 kg)   SpO2 95% Comment: 4 liters O2 chronic  BMI 39.69 kg/m   Physical Exam General: NAD Neck:  JVP 8-9 cm, no thyromegaly or thyroid nodule.  Lungs: Dry crackles at bases.  CV: Nondisplaced PMI.  Heart irregular S1/S2, no S3/S4, no murmur.  Chronic left leg lymphedema.  No carotid bruit.  Normal pedal pulses.  Abdomen: Soft, nontender, no hepatosplenomegaly, no distention.  Skin: Intact without lesions or rashes.  Neurologic: Alert and oriented x 3.  Psych: Normal affect. Extremities: No clubbing or cyanosis.  HEENT: Normal.   Assessment & Plan 1. Atrial fibrillation with tachy-brady syndrome:  Had been off amiodarone since 2019 due to interstitial lung disease/pulmonary fibrosis. Amiodarone restarted during 1/23 admission. s/p TEE-guided DCCV 1/23.  She is in atrial fibrillation today, by symptoms has likely been in AF x 2 wks.  It seems to have triggered a CHF exacerbation.  Rate is not elevated.  - I will arrange for DCCV (she has not missed any Eliquis doses). We discussed risks/benefits and she agrees to procedure.  - She will stay off amiodarone.  - Continue Eliquis 5 mg bid.  CBC today.  2. 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 Echo in 3/23 showed EF 60-65%, mild LVH, normal RV, IVC normal, no definite vegetation noted. She is on midodrine for chronic hypotension.  Repeat RHC (8/23) showed normal filling and PA pressures. NYHA class III, weight is up and she is volume overloaded on exam.  I suspect CHF exacerbation has been triggered by the onset of atrial fibrillation.  - Increase torsemide to 40 mg qam/20 qpm and KCl to 40 qam/20 qpm.  BMET/BNP today, BMET in 10 days.  - Continue Farxiga 10 mg daily.  - I will arrange for repeat echo.  3. Pulmonary hypertension: Based on 4/19 cath, she had severe PAH with PVR 7.2 WU. Chronic hypoxemic respiratory failure, on home oxygen.  She has a history of remote DVT (2004) but V/Q  scan negative for evidence of chronic PE.  High resolution CT chest in 10/19 showed pulmonary fibrosis, and PFTs in 2017 showed severe restriction.  Serological workup for rheumatological diseases was unremarkable.  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 Amiodarone was stopped in past given lung parenchymal disease. 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 RHC (8/23) showed normal filling and PA pressures. - Continue macitentan 10 daily.  - Continue Selexipag 1600 bid.   - Continue sildenafil 20 mg tid.  - She cannot do a 6 minute walk today due to significant pulmonary limitations.  - Home oxygen to continue, needs to restart CPAP.  - As above, now off amiodarone due to possibility of pulmonary toxicity.   4. Endocarditis: in 1/23.  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  Group G strep bacteremia.  Septic emboli to lungs. Seen by EP, thought to be too high risk for device extraction. Unfortunately she will be at high risk for complications going forward.  - She is now on lifetime suppression with cefadroxil.  5. Chronic hypoxemic respiratory failure:  At baseline, on 4L home oxygen with CPAP at night for interstitial lung disease and OSA.   - Continue home O2 4 L by [place] (baseline).  - needs to get back on CPAP, machine is broken.  6. H/o DVT: Anticoagulated.  7. Left leg lymphedema: Chronic, unchanged.  8. OSA: CPAP machine is broken, working on getting a new machine.  9. Orthostatic hypotension: Stable on midodrine.  10. Bradycardia: has [employer], will need generator change soon.   Followup in 6 wks.  Signed, Marca Ancona, MD  03/23/2023  [facility] [address] [facility] [place] [address] [phone] (office) [phone] (fax)

## 2023-03-24 ENCOUNTER — Inpatient Hospital Stay: Payer: Medicare HMO | Attending: Internal Medicine

## 2023-03-24 ENCOUNTER — Inpatient Hospital Stay: Payer: Medicare HMO

## 2023-03-24 ENCOUNTER — Encounter: Payer: Self-pay | Admitting: Internal Medicine

## 2023-03-24 ENCOUNTER — Inpatient Hospital Stay: Payer: Medicare HMO | Admitting: Internal Medicine

## 2023-03-24 VITALS — BP 130/74 | HR 59 | Temp 97.8°F | Ht 60.0 in | Wt 202.8 lb

## 2023-03-24 DIAGNOSIS — Z9981 Dependence on supplemental oxygen: Secondary | ICD-10-CM | POA: Diagnosis not present

## 2023-03-24 DIAGNOSIS — Z7901 Long term (current) use of anticoagulants: Secondary | ICD-10-CM | POA: Insufficient documentation

## 2023-03-24 DIAGNOSIS — I4891 Unspecified atrial fibrillation: Secondary | ICD-10-CM | POA: Diagnosis not present

## 2023-03-24 DIAGNOSIS — Z87891 Personal history of nicotine dependence: Secondary | ICD-10-CM | POA: Insufficient documentation

## 2023-03-24 DIAGNOSIS — E538 Deficiency of other specified B group vitamins: Secondary | ICD-10-CM

## 2023-03-24 DIAGNOSIS — R5383 Other fatigue: Secondary | ICD-10-CM | POA: Diagnosis not present

## 2023-03-24 DIAGNOSIS — D649 Anemia, unspecified: Secondary | ICD-10-CM | POA: Diagnosis not present

## 2023-03-24 DIAGNOSIS — E876 Hypokalemia: Secondary | ICD-10-CM | POA: Insufficient documentation

## 2023-03-24 DIAGNOSIS — D631 Anemia in chronic kidney disease: Secondary | ICD-10-CM | POA: Diagnosis not present

## 2023-03-24 DIAGNOSIS — J449 Chronic obstructive pulmonary disease, unspecified: Secondary | ICD-10-CM | POA: Insufficient documentation

## 2023-03-24 DIAGNOSIS — Z86718 Personal history of other venous thrombosis and embolism: Secondary | ICD-10-CM | POA: Diagnosis not present

## 2023-03-24 DIAGNOSIS — E611 Iron deficiency: Secondary | ICD-10-CM

## 2023-03-24 DIAGNOSIS — N183 Chronic kidney disease, stage 3 unspecified: Secondary | ICD-10-CM | POA: Insufficient documentation

## 2023-03-24 LAB — CBC WITH DIFFERENTIAL (CANCER CENTER ONLY)
Abs Immature Granulocytes: 0.02 10*3/uL (ref 0.00–0.07)
Basophils Absolute: 0 10*3/uL (ref 0.0–0.1)
Basophils Relative: 1 %
Eosinophils Absolute: 0.2 10*3/uL (ref 0.0–0.5)
Eosinophils Relative: 3 %
HCT: 30.9 % — ABNORMAL LOW (ref 36.0–46.0)
Hemoglobin: 8.9 g/dL — ABNORMAL LOW (ref 12.0–15.0)
Immature Granulocytes: 1 %
Lymphocytes Relative: 25 %
Lymphs Abs: 1.1 10*3/uL (ref 0.7–4.0)
MCH: 27.6 pg (ref 26.0–34.0)
MCHC: 28.8 g/dL — ABNORMAL LOW (ref 30.0–36.0)
MCV: 96 fL (ref 80.0–100.0)
Monocytes Absolute: 0.3 10*3/uL (ref 0.1–1.0)
Monocytes Relative: 7 %
Neutro Abs: 2.8 10*3/uL (ref 1.7–7.7)
Neutrophils Relative %: 63 %
Platelet Count: 173 10*3/uL (ref 150–400)
RBC: 3.22 MIL/uL — ABNORMAL LOW (ref 3.87–5.11)
RDW: 12.9 % (ref 11.5–15.5)
WBC Count: 4.4 10*3/uL (ref 4.0–10.5)
nRBC: 0 % (ref 0.0–0.2)

## 2023-03-24 LAB — BASIC METABOLIC PANEL - CANCER CENTER ONLY
Anion gap: 8 (ref 5–15)
BUN: 22 mg/dL (ref 8–23)
CO2: 26 mmol/L (ref 22–32)
Calcium: 8.9 mg/dL (ref 8.9–10.3)
Chloride: 106 mmol/L (ref 98–111)
Creatinine: 1.12 mg/dL — ABNORMAL HIGH (ref 0.44–1.00)
GFR, Estimated: 50 mL/min — ABNORMAL LOW (ref >60)
Glucose, Bld: 106 mg/dL — ABNORMAL HIGH (ref 70–99)
Potassium: 4.5 mmol/L (ref 3.5–5.1)
Sodium: 140 mmol/L (ref 135–145)

## 2023-03-24 LAB — FERRITIN: Ferritin: 480 ng/mL — ABNORMAL HIGH (ref 11–307)

## 2023-03-24 LAB — VITAMIN B12: Vitamin B-12: 286 pg/mL (ref 180–914)

## 2023-03-24 LAB — IRON AND TIBC
Iron: 48 ug/dL (ref 28–170)
Saturation Ratios: 13 % (ref 10.4–31.8)
TIBC: 367 ug/dL (ref 250–450)
UIBC: 319 ug/dL

## 2023-03-24 MED ORDER — CYANOCOBALAMIN 1000 MCG/ML IJ SOLN
1000.0000 ug | Freq: Once | INTRAMUSCULAR | Status: AC
  Administered 2023-03-24: 1000 ug via INTRAMUSCULAR

## 2023-03-24 MED ORDER — EPOETIN ALFA-EPBX 20000 UNIT/ML IJ SOLN
20000.0000 [IU] | Freq: Once | INTRAMUSCULAR | Status: AC
  Administered 2023-03-24: 20000 [IU] via SUBCUTANEOUS

## 2023-03-24 NOTE — Progress Notes (Signed)
[facility] CONSULT NOTE  Patient Care Team: Glori Luis, MD as PCP - General (Family Medicine) Iran Ouch, MD as PCP - Cardiology (Cardiology) [facility] Salvia, MD as PCP - Electrophysiology (Cardiology) Carolee Rota, MD as Referring Physician (Allergy) Lupita Leash, MD (Inactive) as Referring Physician (Internal Medicine) Earna Coder, MD as Consulting Physician (Hematology and Oncology) Otho Ket, RN as [facility] Care Management Anemia CHIEF COMPLAINTS/PURPOSE OF CONSULTATION: Anemia   HEMATOLOGY HISTORY  # CHORNIC IRON DEFICIENCY ANEMIA [BL 10-11]-Jan today 21 PCP Hb-8.8; Maralyn Sago sat-5%; ferritin 11] EGD-; colonoscopy MARCH 2020 -polyp [[facility]-hold further work-up secondary to cardiac issues]; remote-EGD-?;  February 2021-LDH/haptoglobin normal; kappa lambda light chain ratio/M panel normal;  on Venofer; November 2020-CT scan question early cirrhosis/no splenomegaly.  No acute process; MARCH 2021- STOOL OCCULT x3- NEGATIVE.   #B12 deficiency-IM every 3 to 4 months  #2014 -question blood transfusion  [patient admitted for stroke]; CHF chronic -[ 2004 on 4 lits; Dr.Arida/Klein]; CT scan chest 2021 -no adenopathy; fibrotic changes; walker; CKD stage III-   #Incidental right axillary mass-s/p US biopsy [PCP] -negative for malignancy.  HISTORY OF PRESENTING ILLNESS: Patient in wheelchair on [place] O2.  Alone.  Debra Saunders 77 y.o.  female multiple comorbidities including A-fib on anticoagulation, COPD/chronic congestive failure on oxygen and anemia/likely secondary to ?CKD stage III -as needed IV iron is here for follow-up.   Patient complains of having trouble breathing today, started on O2 tanks @ 5L in the clinic.[name] Been without CPAP 6days.   Have procedure/shock on pace maker this Friday, in AFIB.  No blood in stools ; no nausea vomiting.  Continues to be compliant with her Eliquis.  Patient continues with oxygen.     Review of Systems  Constitutional:  Positive for malaise/fatigue. Negative for chills, diaphoresis, fever and weight loss.  HENT:  Negative for nosebleeds and sore throat.   Eyes:  Negative for double vision.  Respiratory:  Positive for shortness of breath. Negative for cough, hemoptysis, sputum production and wheezing.   Cardiovascular:  Negative for chest pain, palpitations, orthopnea and leg swelling.  Gastrointestinal:  Negative for abdominal pain, blood in stool, constipation, heartburn, melena, nausea and vomiting.  Genitourinary:  Negative for dysuria, frequency and urgency.  Musculoskeletal:  Negative for back pain and joint pain.  Skin: Negative.  Negative for itching and rash.  Neurological:  Negative for dizziness, tingling, focal weakness, weakness and headaches.  Endo/Heme/Allergies:  Does not bruise/bleed easily.  Psychiatric/Behavioral:  Negative for depression. The patient is not nervous/anxious and does not have insomnia.     MEDICAL HISTORY:  Past Medical History:  Diagnosis Date   Arthritis    Atrial fibrillation, persistent (HCC)    a. s/p TEE-guided DCCV on 08/20/2017; b. 06/2021 Recurrent Afib-->amio/TEE/DCCV.   CAD (coronary artery disease)    a. 01/2012 Cath: nonobs dzs; b. 04/2014 MV: No ischemia; c. 09/2017 Cath: Minimal nonobs RCA dzs, otw nl cors.   Cardiorenal syndrome    a. 09/2017 Creat rose to 2.7 w/ diuresis-->HD x 2.   Chronic back pain    Chronic combined systolic (congestive) and diastolic (congestive) heart failure (HCC)    a. Dx 2005 @ [facility];  b. 02/2014 Echo: EF 55-60%; c. 07/2017: EF 30-35%; d. 09/2017 Echo: EF 40-45%; e. 02/2018 Echo: EF 55-60%; f. 08/2019 Echo: EF 60-65%; g. 08/2020 RHC: RA 3, RV 30/1, PA 33/11 (20), PCWP 6; f. 08/2021 Echo: EF 60-65%, no rwma, GrIDD, nl RV  fxn, RVSP 40.51mmHg, mod dil LA, mod TR.   Chronic respiratory failure (HCC)    a. on home O2   Gastritis    History of COVID-19    History of endocarditis 08/22/2021   History  of intracranial hemorrhage    a. 6/14 described by neurosurgery as "small frontal posttraumatic SAH " - pt was on xarelto @ the time.   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 Chronic LLE edema.   NICM (nonischemic cardiomyopathy) (HCC)    a. 09/2017 Echo: EF 40-45%; b. 09/2017 Cath: minor, insignificant CAD; c. 02/2018 Echo: EF 55-60%, Gr1 DD; d. 08/2019 Echo: EF 60-65%, Gr2 DD.   Obesity    PAH (pulmonary artery hypertension) (HCC)    a. 09/2017 Echo: PASP ; b. 09/2017 RHC: RA 23, RV 84/13, PA 84/29, PCWP 20, CO 3.89, CI 1.89. LVEDP 18; c. 08/2020 RHC: RA 3, RV 30/1, PA 33/11 (20), PCWP 6. CO/CI 6.18/3.36. PVR 2.3 WU.   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 06/2021. Veg on PPM lead. Not candidate for extraction-->s/p 6 wks abx.   Sepsis with metabolic encephalopathy (HCC)    Sleep apnea    Streptococcal bacteremia    Stroke [facility]) 2014   Subarachnoid hemorrhage (HCC) 01/19/2013   Symptomatic bradycardia    a. s/p BSX PPM.   Thyroid nodule    Type II diabetes mellitus (HCC)    Urinary incontinence     SURGICAL HISTORY: Past Surgical History:  Procedure Laterality Date   ABDOMINAL HYSTERECTOMY  1969   BREAST BIOPSY Right 10/28/2019   lymph node bx, [place] Bx, pending path    CARDIAC CATHETERIZATION  2013   @ [facility]: No obstructive CAD: Only 20% ostial left circumflex.   CARDIOVERSION N/A 10/12/2017   Procedure: CARDIOVERSION;  Surgeon: Antonieta Iba, MD;  Location: [facility] ORS;  Service: Cardiovascular;  Laterality: N/A;   CARDIOVERSION N/A 07/04/2021   Procedure: CARDIOVERSION;  Surgeon: Laurey Morale, MD;  Location: [facility] ENDOSCOPY;  Service: Cardiovascular;  Laterality: N/A;   CATARACT EXTRACTION W/PHACO Right 09/19/2020   Procedure: CATARACT EXTRACTION PHACO AND INTRAOCULAR LENS PLACEMENT (IOC) RIGHT  DIABETIC 3.95 00:43.0 9.2%;  Surgeon: Lockie Mola, MD;  Location: [facility] SURGERY CNTR;  Service: Ophthalmology;  Laterality: Right;  Pt requests to be last sleep apnea   CATARACT EXTRACTION W/PHACO Left 10/03/2020   Procedure: CATARACT EXTRACTION PHACO AND INTRAOCULAR LENS PLACEMENT (IOC) LEFT DIABETIC;  Surgeon: Lockie Mola, MD;  Location: [facility] SURGERY CNTR;  Service: Ophthalmology;  Laterality: Left;  3.25 0:59.3 5.5%   CHOLECYSTECTOMY     COLONOSCOPY WITH PROPOFOL N/A 09/10/2018   Procedure: COLONOSCOPY WITH PROPOFOL;  Surgeon: Scot Jun, MD;  Location: [facility] ENDOSCOPY;  Service: Endoscopy;  Laterality: N/A;   COLONOSCOPY WITH PROPOFOL N/A 10/22/2022   Procedure: COLONOSCOPY WITH PROPOFOL;  Surgeon: Jaynie Collins, DO;  Location: [facility] ENDOSCOPY;  Service: Gastroenterology;  Laterality: N/A;   CYST REMOVAL TRUNK  2002   BACK   DIALYSIS/PERMA CATHETER INSERTION N/A 10/06/2017   Procedure: DIALYSIS/PERMA CATHETER INSERTION;  Surgeon: Renford Dills, MD;  Location: [facility] INVASIVE CV LAB;  Service: Cardiovascular;  Laterality: N/A;   ESOPHAGOGASTRODUODENOSCOPY (EGD) WITH PROPOFOL N/A 10/21/2022   Procedure: ESOPHAGOGASTRODUODENOSCOPY (EGD) WITH PROPOFOL;  Surgeon: Jaynie Collins, DO;  Location: [facility]  ENDOSCOPY;  Service: Gastroenterology;  Laterality: N/A;   GIVENS CAPSULE STUDY N/A 10/22/2022   Procedure: GIVENS CAPSULE STUDY;  Surgeon: Jaynie Collins, DO;  Location: [facility] ENDOSCOPY;  Service: Gastroenterology;  Laterality: N/A;  IF NOTHING IS FOUND WITH COLONOSCOPY   INSERT / REPLACE / REMOVE PACEMAKER     lipoma removal  2014   back   PACEMAKER INSERTION  2015   [employer] dual chamber pacemaker implanted by Dr Graciela Husbands for symptomatic bradycardia   PERMANENT PACEMAKER INSERTION N/A 01/09/2014   Procedure: PERMANENT PACEMAKER INSERTION;  Surgeon: [facility] Salvia, MD;  Location: [facility] CATH LAB;  Service: Cardiovascular;  Laterality: N/A;   RIGHT HEART  CATH N/A 04/19/2018   Procedure: RIGHT HEART CATH;  Surgeon: Laurey Morale, MD;  Location: [facility] INVASIVE CV LAB;  Service: Cardiovascular;  Laterality: N/A;   RIGHT HEART CATH N/A 08/30/2020   Procedure: RIGHT HEART CATH;  Surgeon: Laurey Morale, MD;  Location: [facility] INVASIVE CV LAB;  Service: Cardiovascular;  Laterality: N/A;   RIGHT HEART CATH N/A 02/11/2022   Procedure: RIGHT HEART CATH;  Surgeon: Laurey Morale, MD;  Location: [facility] INVASIVE CV LAB;  Service: Cardiovascular;  Laterality: N/A;   RIGHT/LEFT HEART CATH AND CORONARY ANGIOGRAPHY N/A 10/19/2017   Procedure: RIGHT/LEFT HEART CATH AND CORONARY ANGIOGRAPHY;  Surgeon: Iran Ouch, MD;  Location: [facility] INVASIVE CV LAB;  Service: Cardiovascular;  Laterality: N/A;   TEE WITHOUT CARDIOVERSION N/A 08/20/2017   Procedure: TRANSESOPHAGEAL ECHOCARDIOGRAM (TEE) & Direct current cardioversion;  Surgeon: Antonieta Iba, MD;  Location: [facility] ORS;  Service: Cardiovascular;  Laterality: N/A;   TEE WITHOUT CARDIOVERSION N/A 06/28/2021   Procedure: TRANSESOPHAGEAL ECHOCARDIOGRAM (TEE);  Surgeon: Antonieta Iba, MD;  Location: [facility] ORS;  Service: Cardiovascular;  Laterality: N/A;   TEE WITHOUT CARDIOVERSION N/A 07/04/2021   Procedure: TRANSESOPHAGEAL ECHOCARDIOGRAM (TEE);  Surgeon: Laurey Morale, MD;  Location: [facility] ENDOSCOPY;  Service: Cardiovascular;  Laterality: N/A;   TRIGGER FINGER RELEASE      SOCIAL HISTORY: Social History   Socioeconomic History   Marital status: Divorced    Spouse name: Not on file   Number of children: 4   Years of education: 12   Highest education level: Associate degree: occupational, Scientist, product/process development, or vocational program  Occupational History   Occupation: Retired- factory work    Comment: exposed to Interior and spatial designer  Tobacco Use   Smoking status: Former    Current packs/day: 0.00    Average packs/day: 0.3 packs/day for 2.0 years (0.6 ttl pk-yrs)    Types: Cigarettes    Start date: 06/23/1988    Quit date: 06/23/1990     Years since quitting: 32.7    Passive exposure: Past   Smokeless tobacco: Never  Vaping Use   Vaping status: Never Used  Substance and Sexual Activity   Alcohol use: No   Drug use: No   Sexual activity: Not Currently    Birth control/protection: Surgical  Other Topics Concern   Not on file  Social History Narrative   Lives in [place] with daughter. Has 4 children. No pets.      Work - Hotel manager, retired      Diet - regular      02/14/2019: reports has Meals On Wheels delivered to her home;       Remote hx of smoking > 30 years; No alcohol-    Social Determinants of Health   Financial Resource Strain: Low Risk  (03/09/2023)   Overall Physicist, medical Strain (  CARDIA)    Difficulty of Paying Living Expenses: Not hard at all  Food Insecurity: No Food Insecurity (03/09/2023)   Hunger Vital Sign    Worried About Running Out of Food in the Last Year: Never true    Ran Out of Food in the Last Year: Never true  Transportation Needs: No Transportation Needs (03/09/2023)   PRAPARE - Administrator, Civil Service (Medical): No    Lack of Transportation (Non-Medical): No  Recent Concern: Transportation Needs - Unmet Transportation Needs (02/26/2023)   PRAPARE - Administrator, Civil Service (Medical): Yes    Lack of Transportation (Non-Medical): No  Physical Activity: Inactive (03/09/2023)   Exercise Vital Sign    Days of Exercise per Week: 0 days    Minutes of Exercise per Session: 0 min  Stress: No Stress Concern Present (03/09/2023)   [employer] of Occupational Health - Occupational Stress Questionnaire    Feeling of Stress : Not at all  Social Connections: Socially Isolated (03/09/2023)   Social Connection and Isolation Panel [NHANES]    Frequency of Communication with Friends and Family: More than three times a week    Frequency of Social Gatherings with Friends and Family: More than three times a week    Attends Religious Services: Never     Database administrator or Organizations: No    Attends Banker Meetings: Never    Marital Status: Divorced  Catering manager Violence: Not At Risk (03/09/2023)   Humiliation, Afraid, Rape, and Kick questionnaire    Fear of Current or Ex-Partner: No    Emotionally Abused: No    Physically Abused: No    Sexually Abused: No    FAMILY HISTORY: Family History  Problem Relation Age of Onset   Heart disease Mother    Hypertension Mother    COPD Mother        was a smoker   Thyroid disease Mother    Hypertension Father    Hypertension Sister    Thyroid disease Sister    Breast cancer Sister    Hypertension Sister    Thyroid disease Sister    Bone cancer Sister    Stroke Son    Breast cancer Maternal Aunt    Kidney disease Neg Hx    Bladder Cancer Neg Hx     ALLERGIES:  is allergic to aspirin and other.  MEDICATIONS:  Current Outpatient Medications  Medication Sig Dispense Refill   acetaminophen (TYLENOL) 325 MG tablet Take 325-650 mg by mouth every 6 (six) hours as needed for mild pain.     albuterol (ACCUNEB) 0.63 MG/3ML nebulizer solution Take 1 ampule by nebulization every 6 (six) hours as needed for wheezing.     apixaban (ELIQUIS) 5 MG TABS tablet Take 1 tablet (5 mg total) by mouth 2 (two) times daily. 180 tablet 3   atropine 1 % ophthalmic solution Place 1 drop into both eyes at bedtime.     cefadroxil (DURICEF) 500 MG capsule Take 1 capsule (500 mg total) by mouth 2 (two) times daily. 60 capsule 12   cholestyramine (QUESTRAN) 4 g packet Take 1 packet by mouth daily.     dapagliflozin propanediol (FARXIGA) 10 MG TABS tablet Take 1 tablet (10 mg total) by mouth daily before breakfast. 90 tablet 6   gabapentin (NEURONTIN) 600 MG tablet Take 600 mg by mouth at bedtime.     macitentan (OPSUMIT) 10 MG tablet Take 1 tablet (10 mg total) by  mouth daily. 90 tablet 3   meclizine (ANTIVERT) 25 MG tablet Take 1 tablet (25 mg total) by mouth 3 (three) times daily as  needed for dizziness. 30 tablet 1   Menthol, Topical Analgesic, (BIOFREEZE EX) Apply 1 application topically 4 (four) times daily as needed (for pain).     midodrine (PROAMATINE) 5 MG tablet Take one tablet at 12 noon, 4pm and 8pm 270 tablet 3   oxyCODONE (OXY IR/ROXICODONE) 5 MG immediate release tablet Take 1 tablet (5 mg total) by mouth every 4 (four) hours as needed for moderate pain, breakthrough pain or severe pain. 12 tablet 0   potassium chloride SA (KLOR-CON M) 20 MEQ tablet Take every morning and 20mg  every evening.     rosuvastatin (CRESTOR) 10 MG tablet Take 1 tablet by mouth once daily 90 tablet 0   Selexipag (UPTRAVI) 1600 MCG TABS Take 1 tablet (1,600 mcg total) by mouth 2 (two) times daily. 60 tablet 11   sildenafil (REVATIO) 20 MG tablet Take 1 tablet (20 mg total) by mouth 3 (three) times daily. 90 tablet 3   torsemide (DEMADEX) 20 MG tablet Take 40mg  every morning and take 20mg  every evening.     lidocaine (LIDODERM) 5 % Place 2 patches onto the skin daily. Remove & Discard patch within 12 hours or as directed by MD (Patient not taking: Reported on 03/24/2023) 30 patch 0   No current facility-administered medications for this visit.   Facility-Administered Medications Ordered in Other Visits  Medication Dose Route Frequency Provider Last Rate Last Admin   cyanocobalamin (VITAMIN B12) injection 1,000 mcg  1,000 mcg Intramuscular Once Louretta Shorten R, MD       epoetin alfa-epbx (RETACRIT) injection 20,000 Units  20,000 Units Subcutaneous Once Earna Coder, MD          PHYSICAL EXAMINATION:   Vitals:   03/24/23 1302 03/24/23 1318  BP: (!) 142/66 130/74  Pulse: (!) 59   Temp: 97.8 F (36.6 C)   SpO2: 100%      Filed Weights   03/24/23 1302  Weight: 202 lb 12.8 oz (92 kg)      Physical Exam Constitutional:      Comments: Patient in wheelchair.  Alone.  She is on O2 nasal cannula.  HENT:     Head: Normocephalic and atraumatic.      Mouth/Throat:     Pharynx: No oropharyngeal exudate.  Eyes:     Pupils: Pupils are equal, round, and reactive to light.  Cardiovascular:     Rate and Rhythm: Normal rate and regular rhythm.  Pulmonary:     Effort: No respiratory distress.     Breath sounds: No wheezing.     Comments: Decreased air entry bilaterally.  No wheeze or crackles. Abdominal:     General: Bowel sounds are normal. There is no distension.     Palpations: Abdomen is soft. There is no mass.     Tenderness: There is no abdominal tenderness. There is no guarding or rebound.  Musculoskeletal:        General: No tenderness. Normal range of motion.     Cervical back: Normal range of motion and neck supple.  Skin:    General: Skin is warm.  Neurological:     Mental Status: She is alert and oriented to person, place, and time.  Psychiatric:        Mood and Affect: Affect normal.     LABORATORY DATA:  I have reviewed the data as  listed Lab Results  Component Value Date   WBC 4.4 03/24/2023   HGB 8.9 (L) 03/24/2023   HCT 30.9 (L) 03/24/2023   MCV 96.0 03/24/2023   PLT 173 03/24/2023   Recent Labs    10/20/22 0423 10/21/22 0404 10/22/22 0143 11/18/22 1302 12/19/22 1236 01/19/23 1433 02/03/23 1612 03/23/23 1540 03/24/23 1257  NA 141 138   < > 142   < > 139 144 142 140  K 2.9* 3.9   < > 2.9*   < > 4.6 4.4 4.4 4.5  CL 101 101   < > 104   < > 103 108* 107 106  CO2 32 29   < > 28   < > 28 21 27 26   GLUCOSE 107* 120*   < > 114*   < > 109* 95 107* 106*  BUN 23 19   < > 17   < > 34* 23 14 22   CREATININE 0.96 0.94   < > 0.85   < > 1.25* 0.97 0.80 1.12*  CALCIUM 8.7* 9.0   < > 8.5*   < > 9.0 9.2 8.9 8.9  GFRNONAA >60 >60   < > >60   < > 44*  --  >60 50*  PROT 6.6 6.7  --  6.8  --   --   --   --   --   ALBUMIN 3.8 3.7  --  3.6  --   --   --   --   --   AST 14* 16  --  18  --   --   --   --   --   ALT 12 13  --  15  --   --   --   --   --   ALKPHOS 98 102  --  97  --   --   --   --   --   BILITOT 0.8 0.6   --  0.4  --   --   --   --   --   BILIDIR  --  0.1  --   --   --   --   --   --   --   IBILI  --  0.5  --   --   --   --   --   --   --    < > = values in this interval not displayed.     CUP PACEART REMOTE DEVICE CHECK  Result Date: 02/25/2023 Scheduled remote reviewed. Normal device function.  4 ATR's 1-8sec in duration Next remote 91 days. LA, CVRS    Symptomatic anemia # Chronic Anemia [BL10-11]-iron deficiency/? CKD stage III  currently s/p Venofer.   # Gwenlyn Perking 2024- ferritin 680 iron sat 17%]; continue gentle iron 1 pill a day.    HOLD venofer today.  Proceed with Retacrit with patient's hemoglobin does not improve.    # Etiology of anemia-unclear- CKD stage III  vs Others-[Dr.Lateef]/  S/p EGD/ capsule MAY 2024- NEG [hospital]; [place] [Dr.Russo]- MAY 2024 - NEG. [chronic diarrhea/malabsorption]-monitor closely.  If anemia is worse consider bone marrow biopsy. Discussed with patient.   # Mild Hypokalemia- today- 4.5- continue Kdur BID;- stable.   #History of DVT/s/p IVC filter; ?  A. Fib-Eliquis- awaiting cario-conversion [Dr.Klein]   #Borderline low B12-OCT 2023- 230; s/p B12 injection.; proceed with injection q 2- 4 months.    # DISPOSITION: # proceed  b12 today and retacrit NO venofer today; # Follow  up in 2 months-MD -  labs-- cbc;bmp; b12 levels;possible Venofer OR retacrit;  B12 injection- Dr.B  Cc; [place] Dun    All questions were answered. The patient knows to call the clinic with any problems, questions or concerns.    Earna Coder, MD 03/24/2023 1:51 PM

## 2023-03-24 NOTE — Progress Notes (Signed)
Pt having trouble breathing today, Debra Saunders in lab put her on one of our O2 tanks @ 5L. Been without CPAP 6days.  Have procedure/shock on pace maker this Friday, in AFIB.  No appetite, no supplements, having nausea.

## 2023-03-24 NOTE — Assessment & Plan Note (Addendum)
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 CKD stage III  currently s/p Venofer.   # Gwenlyn Perking 2024- ferritin 680 iron sat 17%]; continue gentle iron 1 pill a day.    HOLD venofer today.  Proceed with Retacrit with patient's hemoglobin does not improve.    # Etiology of anemia-unclear- CKD stage III  vs Others-[Dr.Lateef]/  S/p EGD/ capsule MAY 2024- NEG [hospital]; [place] [Dr.Russo]- MAY 2024 - NEG. [chronic diarrhea/malabsorption]-monitor closely.  If anemia is worse consider bone marrow biopsy. Discussed with patient.   # Mild Hypokalemia- today- 4.5- continue Kdur BID;- stable.   #History of DVT/s/p IVC filter; ?  A. Fib-Eliquis- awaiting cario-conversion [Dr.Klein]   #Borderline low B12-OCT 2023- 230; s/p B12 injection.; proceed with injection q 2- 4 months.    # DISPOSITION: # proceed  b12 today and retacrit NO venofer today; # Follow up in 2 months-MD -  labs-- cbc;bmp; b12 levels;possible Venofer OR retacrit;  B12 injection- Dr.B  Cc; [place] Dun

## 2023-03-25 ENCOUNTER — Ambulatory Visit: Payer: Self-pay

## 2023-03-26 ENCOUNTER — Telehealth: Payer: Self-pay

## 2023-03-26 NOTE — Patient Instructions (Signed)
Visit Information  Thank you for taking time to visit with me today. Please don't hesitate to contact me if I can be of assistance to you.   Following are the goals we discussed today:   Goals Addressed             This Visit's Progress    management of health conditions.       Interventions Today    Flowsheet Row Most Recent Value  Chronic Disease   Chronic disease during today's visit Congestive Heart Failure (CHF), Atrial Fibrillation (AFib)  General Interventions   General Interventions Discussed/Reviewed General Interventions Reviewed, Doctor Visits, Durable Medical Equipment (DME)  [evaluation of current treatment plan for HF/ Atrial fibrillation and patients adherence to plan as established by provider. Assessed for HF,/ Atrial fibrillation symptoms.]  Doctor Visits Discussed/Reviewed Doctor Visits Reviewed  Annabell Sabal most recent cardiology/ oncology visits with patient.  Reviewed upcoming provider visits to confirm patient aware of appointment times and dates.]  Durable Medical Equipment (DME) Other  [Called [facility] to assist with coordinating pick up/ delivery of CPAP. Spoke with Irene Pap at [employer] who sent message to Colletta [place] to return this RNCM's call]  Education Interventions   Education Provided Provided Education  [Advised patient to call 911 for worsening atrial fibrillation / shortness of breath symptoms]  Pharmacy Interventions   Pharmacy Dicussed/Reviewed Pharmacy Topics Reviewed              Our next appointment is by telephone on 04/03/23 at 3 pm  Please call the care guide team at [phone] if you need to cancel or reschedule your appointment.   If you are experiencing a Mental Health or Behavioral Health Crisis or need someone to talk to, please call the Suicide and Crisis Lifeline: 988 call 1-800-273-TALK (toll free, 24 hour hotline)  Patient verbalizes understanding of instructions and care plan provided today and agrees to view in  MyChart. Active MyChart status and patient understanding of how to access instructions and care plan via MyChart confirmed with patient.     George Ina RN,BSN,CCM [facility] Care Coordination [phone] direct line

## 2023-03-26 NOTE — Patient Outreach (Signed)
Care Coordination   Follow Up Visit Note   03/26/2023 Name: ALEYLA SAMAND MRN: [id number] DOB: December 19, 1945  ADAMARIS CASH is a 77 y.o. year old female who sees Birdie Sons, Yehuda Mao, MD for primary care. I spoke with patient  SHANENA SKRZYPEK  and Colletta [place] from [employer] by phone today.  What matters to the patients health and wellness today?   Telephone call to [facility].  Contact made with [place] who states she will forward message to Colletta [place] to return call.   Return call received from Colletta [place] with [employer] [facility]. Discussed patients inability to pick up CPAP at this time due to current health status.  Duwayne Heck states she will have CPAP machine shipped overnight to patient. RNCM contacted patient to inform her that CPAP machine will be shipped to her overnight.  Patient voiced appreciation for the assistance.   Goals Addressed   None     SDOH assessments and interventions completed:  No     Care Coordination Interventions:  Yes, provided   Follow up plan: Follow up call scheduled for 04/03/23    Encounter Outcome:  Patient Visit Completed   George Ina RN,BSN,CCM [facility] Care Coordination [phone] direct line

## 2023-03-26 NOTE — Telephone Encounter (Signed)
Patient's daughter, [name] Marrow, called to state she is returning our call.

## 2023-03-26 NOTE — Patient Outreach (Signed)
Care Coordination   Follow Up Visit Note   03/26/2023 Late entry for 03/25/23 Name: Debra Saunders MRN: [id number] DOB: Aug 31, 1945  Debra Saunders is a 77 y.o. year old female who sees Birdie Sons, Yehuda Mao, MD for primary care. I spoke with  [name] by phone today.  What matters to the patients health and wellness today?  Patient reports having follow up visit on  03/23/23 with cardiologist and oncologist on 03/24/23.  She states at her cardiology visit she was found to be in Atrial fibrillation and therefore has been scheduled for cardioversion on 03/27/23. She states she is having increased SOB therefore having to increase her oxygen use to 5 L.   Patient states she has been without her CPAP x 6 days due to unit being broken.  She reports being advised by her cardiologist and oncologist that she needs to be on her CPAP.  She states she and her daughter have tried to get another CPAP machine.  She states they were told by [employer] she owed money which she states she has since paid.  She states [employer] advised her that they will not delivery the CPAP but someone will have to pick up.  Patient states this will be to difficult for her now being in active atrial fibrillation and having increase shortness of breath.  This RNCM offered to contact [employer] for patient to assist with care coordination.  Patient voiced appreciation for the assistance.  RNCM called to [employer] and spoke with [place].  Inquired if patient was able to have CPAP delivered due to current health situation.  Irene Pap forwarded [facility] request to Colletta [place] to return call.    Goals Addressed             This Visit's Progress    management of health conditions.       Interventions Today    Flowsheet Row Most Recent Value  Chronic Disease   Chronic disease during today's visit Congestive Heart Failure (CHF), Atrial Fibrillation (AFib)  General Interventions   General Interventions Discussed/Reviewed General  Interventions Reviewed, Doctor Visits, Durable Medical Equipment (DME)  [evaluation of current treatment plan for HF/ Atrial fibrillation and patients adherence to plan as established by provider. Assessed for HF,/ Atrial fibrillation symptoms.]  Doctor Visits Discussed/Reviewed Doctor Visits Reviewed  Annabell Sabal most recent cardiology/ oncology visits with patient.  Reviewed upcoming provider visits to confirm patient aware of appointment times and dates.]  Durable Medical Equipment (DME) Other  [Called [employer] [facility] to assist with coordinating pick up/ delivery of CPAP. Spoke with Irene Pap at [employer] who sent message to Colletta [place] to return this RNCM's call]  Education Interventions   Education Provided Provided Education  [Advised patient to call 911 for worsening atrial fibrillation / shortness of breath symptoms]  Pharmacy Interventions   Pharmacy Dicussed/Reviewed Pharmacy Topics Reviewed              SDOH assessments and interventions completed:  No     Care Coordination Interventions:  Yes, provided   Follow up plan: Follow up call scheduled for 04/03/23    Encounter Outcome:  Patient Visit Completed   George Ina RN,BSN,CCM [facility] Care Coordination [phone] direct line

## 2023-03-27 ENCOUNTER — Ambulatory Visit
Admission: RE | Admit: 2023-03-27 | Discharge: 2023-03-27 | Disposition: A | Discharge status: Home / Self Care | Payer: Medicare HMO | Source: Ambulatory Visit | Attending: Cardiology | Admitting: Cardiology

## 2023-03-27 ENCOUNTER — Encounter
Admission: RE | Disposition: A | Discharge status: Home / Self Care | Payer: Self-pay | Source: Ambulatory Visit | Attending: Cardiology

## 2023-03-27 ENCOUNTER — Other Ambulatory Visit: Payer: Self-pay

## 2023-03-27 ENCOUNTER — Encounter: Payer: Self-pay | Admitting: Anesthesiology

## 2023-03-27 ENCOUNTER — Telehealth: Payer: Self-pay | Admitting: Internal Medicine

## 2023-03-27 DIAGNOSIS — I252 Old myocardial infarction: Secondary | ICD-10-CM | POA: Diagnosis not present

## 2023-03-27 DIAGNOSIS — I495 Sick sinus syndrome: Secondary | ICD-10-CM

## 2023-03-27 DIAGNOSIS — I4891 Unspecified atrial fibrillation: Secondary | ICD-10-CM | POA: Diagnosis not present

## 2023-03-27 DIAGNOSIS — I480 Paroxysmal atrial fibrillation: Secondary | ICD-10-CM | POA: Insufficient documentation

## 2023-03-27 DIAGNOSIS — Z950 Presence of cardiac pacemaker: Secondary | ICD-10-CM | POA: Diagnosis not present

## 2023-03-27 DIAGNOSIS — I444 Left anterior fascicular block: Secondary | ICD-10-CM | POA: Insufficient documentation

## 2023-03-27 DIAGNOSIS — Z539 Procedure and treatment not carried out, unspecified reason: Secondary | ICD-10-CM | POA: Diagnosis not present

## 2023-03-27 HISTORY — PX: CARDIOVERSION: SHX1299

## 2023-03-27 SURGERY — CARDIOVERSION
Anesthesia: General

## 2023-03-27 MED ORDER — CEFAZOLIN SODIUM-DEXTROSE 2-4 GM/100ML-% IV SOLN: 2.0000 g | INTRAVENOUS | Status: DC

## 2023-03-27 MED ORDER — SODIUM CHLORIDE 0.9 % IV SOLN
80.0000 mg | INTRAVENOUS | Status: DC
  Filled 2023-03-27: qty 2

## 2023-03-27 NOTE — Anesthesia Preprocedure Evaluation (Addendum)
Anesthesia Evaluation    Airway        Dental   Pulmonary sleep apnea and Continuous Positive Airway Pressure Ventilation , former smoker (quit 1992) Interstitial lung disease on home O2          Cardiovascular hypertension, pulmonary hypertension+ CAD and +CHF (NICM)  + dysrhythmias (a fib on Eliquis) + pacemaker (symptomatic bradycardia)   ECG 03/23/23:  Atrial fibrillation with frequent ventricular-paced complexes Low voltage QRS ST & T wave abnormality, consider inferolateral ischemia   Neuro/Psych Hx small SAH CVA (2014)    GI/Hepatic   Endo/Other  diabetes, Type 2  Obesity   Renal/GU Renal disease (stage III CKD)     Musculoskeletal  (+) Arthritis ,    Abdominal   Peds  Hematology  (+) Blood dyscrasia, anemia   Anesthesia Other Findings   Reproductive/Obstetrics                             Anesthesia Physical Anesthesia Plan  ASA: 3  Anesthesia Plan: General   Post-op Pain Management:    Induction:   PONV Risk Score and Plan:   Airway Management Planned:   Additional Equipment:   Intra-op Plan:   Post-operative Plan:   Informed Consent:   Plan Discussed with:   Anesthesia Plan Comments: (Case canceled on DOS for NSR.)        Anesthesia Quick Evaluation

## 2023-03-27 NOTE — Telephone Encounter (Signed)
Patient would like to know where she can get soap scrub for 10/09 procedure with Dr. Graciela Husbands. Please advise.

## 2023-03-27 NOTE — Telephone Encounter (Signed)
Patient's daughter Debra Saunders stated on 03/26/23 her mother received a call from this office at 9:39  but I do not see any messages for her or where anyone tried to call her.

## 2023-03-27 NOTE — Telephone Encounter (Signed)
Pt coming in Monday for  labs and will pick up soap scrub at that time ./cy

## 2023-03-27 NOTE — Progress Notes (Signed)
Upon arrival , performed 2 12 Lead EKG's (with and without Pacer mode). EKGs seen by MD. Cardioversion cancelled for today per MD. Pt. And POA daughter [name] Marrow aware & agreeable.

## 2023-03-30 ENCOUNTER — Encounter: Payer: Self-pay | Admitting: Cardiology

## 2023-03-30 ENCOUNTER — Other Ambulatory Visit: Admission: RE | Admit: 2023-03-30 | Payer: Medicare HMO | Source: Home / Self Care | Admitting: Internal Medicine

## 2023-03-30 DIAGNOSIS — I5032 Chronic diastolic (congestive) heart failure: Secondary | ICD-10-CM | POA: Diagnosis not present

## 2023-03-31 DIAGNOSIS — J8410 Pulmonary fibrosis, unspecified: Secondary | ICD-10-CM | POA: Diagnosis not present

## 2023-03-31 DIAGNOSIS — I4891 Unspecified atrial fibrillation: Secondary | ICD-10-CM | POA: Diagnosis not present

## 2023-03-31 DIAGNOSIS — I5042 Chronic combined systolic (congestive) and diastolic (congestive) heart failure: Secondary | ICD-10-CM | POA: Diagnosis not present

## 2023-03-31 DIAGNOSIS — J9611 Chronic respiratory failure with hypoxia: Secondary | ICD-10-CM | POA: Diagnosis not present

## 2023-03-31 DIAGNOSIS — N189 Chronic kidney disease, unspecified: Secondary | ICD-10-CM | POA: Diagnosis not present

## 2023-03-31 DIAGNOSIS — D631 Anemia in chronic kidney disease: Secondary | ICD-10-CM | POA: Diagnosis not present

## 2023-03-31 DIAGNOSIS — J449 Chronic obstructive pulmonary disease, unspecified: Secondary | ICD-10-CM | POA: Diagnosis not present

## 2023-03-31 DIAGNOSIS — E1122 Type 2 diabetes mellitus with diabetic chronic kidney disease: Secondary | ICD-10-CM | POA: Diagnosis not present

## 2023-03-31 DIAGNOSIS — I130 Hypertensive heart and chronic kidney disease with heart failure and stage 1 through stage 4 chronic kidney disease, or unspecified chronic kidney disease: Secondary | ICD-10-CM | POA: Diagnosis not present

## 2023-03-31 LAB — BRAIN NATRIURETIC PEPTIDE: BNP: 117.4 pg/mL — ABNORMAL HIGH (ref 0.0–100.0)

## 2023-03-31 NOTE — Pre-Procedure Instructions (Signed)
Spoke with daughter, [name].  Reviewed the following items: Arrival time 0800 Nothing to eat or drink after midnight No meds AM of procedure Responsible person to drive you home and stay with you for 24 hrs Wash with special soap night before and morning of procedure If on anti-coagulant drug instructions Eliquis- last dose Sunday evening.

## 2023-04-01 ENCOUNTER — Other Ambulatory Visit: Payer: Self-pay

## 2023-04-01 ENCOUNTER — Ambulatory Visit (HOSPITAL_COMMUNITY)
Admission: RE | Disposition: A | Discharge status: Home / Self Care | Payer: Medicare HMO | Source: Ambulatory Visit | Attending: Internal Medicine

## 2023-04-01 ENCOUNTER — Ambulatory Visit (HOSPITAL_COMMUNITY)
Admission: RE | Admit: 2023-04-01 | Discharge: 2023-04-01 | Disposition: A | Discharge status: Home / Self Care | Payer: Medicare HMO | Source: Ambulatory Visit | Attending: Internal Medicine | Admitting: Internal Medicine

## 2023-04-01 DIAGNOSIS — I4819 Other persistent atrial fibrillation: Secondary | ICD-10-CM | POA: Insufficient documentation

## 2023-04-01 DIAGNOSIS — D649 Anemia, unspecified: Secondary | ICD-10-CM | POA: Diagnosis not present

## 2023-04-01 DIAGNOSIS — Z87891 Personal history of nicotine dependence: Secondary | ICD-10-CM | POA: Diagnosis not present

## 2023-04-01 DIAGNOSIS — R0609 Other forms of dyspnea: Secondary | ICD-10-CM | POA: Insufficient documentation

## 2023-04-01 DIAGNOSIS — I4589 Other specified conduction disorders: Secondary | ICD-10-CM | POA: Insufficient documentation

## 2023-04-01 DIAGNOSIS — E1122 Type 2 diabetes mellitus with diabetic chronic kidney disease: Secondary | ICD-10-CM | POA: Diagnosis not present

## 2023-04-01 DIAGNOSIS — T85698A Other mechanical complication of other specified internal prosthetic devices, implants and grafts, initial encounter: Secondary | ICD-10-CM

## 2023-04-01 DIAGNOSIS — N186 End stage renal disease: Secondary | ICD-10-CM | POA: Insufficient documentation

## 2023-04-01 DIAGNOSIS — I495 Sick sinus syndrome: Secondary | ICD-10-CM | POA: Insufficient documentation

## 2023-04-01 DIAGNOSIS — I4719 Other supraventricular tachycardia: Secondary | ICD-10-CM | POA: Diagnosis not present

## 2023-04-01 DIAGNOSIS — I132 Hypertensive heart and chronic kidney disease with heart failure and with stage 5 chronic kidney disease, or end stage renal disease: Secondary | ICD-10-CM | POA: Insufficient documentation

## 2023-04-01 DIAGNOSIS — T85698D Other mechanical complication of other specified internal prosthetic devices, implants and grafts, subsequent encounter: Secondary | ICD-10-CM | POA: Diagnosis not present

## 2023-04-01 DIAGNOSIS — Z45010 Encounter for checking and testing of cardiac pacemaker pulse generator [battery]: Secondary | ICD-10-CM | POA: Insufficient documentation

## 2023-04-01 DIAGNOSIS — I951 Orthostatic hypotension: Secondary | ICD-10-CM | POA: Insufficient documentation

## 2023-04-01 DIAGNOSIS — I5032 Chronic diastolic (congestive) heart failure: Secondary | ICD-10-CM | POA: Insufficient documentation

## 2023-04-01 HISTORY — PX: PPM GENERATOR CHANGEOUT: EP1233

## 2023-04-01 LAB — GLUCOSE, CAPILLARY
Glucose-Capillary: 119 mg/dL — ABNORMAL HIGH (ref 70–99)
Glucose-Capillary: 125 mg/dL — ABNORMAL HIGH (ref 70–99)

## 2023-04-01 SURGERY — PPM GENERATOR CHANGEOUT

## 2023-04-01 MED ORDER — LIDOCAINE HCL (PF) 1 % IJ SOLN
INTRAMUSCULAR | Status: AC
  Filled 2023-04-01: qty 60

## 2023-04-01 MED ORDER — ACETAMINOPHEN 325 MG PO TABS: 325.0000 mg | ORAL_TABLET | ORAL | Status: DC | PRN

## 2023-04-01 MED ORDER — CHLORHEXIDINE GLUCONATE 4 % EX SOLN: 4.0000 | Freq: Once | CUTANEOUS | Status: DC

## 2023-04-01 MED ORDER — SODIUM CHLORIDE 0.9 % IV SOLN
INTRAVENOUS | Status: AC
  Filled 2023-04-01: qty 2

## 2023-04-01 MED ORDER — MIDAZOLAM HCL 5 MG/5ML IJ SOLN
INTRAMUSCULAR | Status: DC | PRN
  Administered 2023-04-01 (×2): 1 mg via INTRAVENOUS

## 2023-04-01 MED ORDER — MIDAZOLAM HCL 2 MG/2ML IJ SOLN
INTRAMUSCULAR | Status: AC
  Filled 2023-04-01: qty 2

## 2023-04-01 MED ORDER — FENTANYL CITRATE (PF) 100 MCG/2ML IJ SOLN
INTRAMUSCULAR | Status: AC
  Filled 2023-04-01: qty 2

## 2023-04-01 MED ORDER — LIDOCAINE HCL (PF) 1 % IJ SOLN
INTRAMUSCULAR | Status: DC | PRN
  Administered 2023-04-01: 60 mL

## 2023-04-01 MED ORDER — APIXABAN 5 MG PO TABS: 5.0000 mg | ORAL_TABLET | Freq: Two times a day (BID) | ORAL | 3 refills | Status: AC

## 2023-04-01 MED ORDER — FENTANYL CITRATE (PF) 100 MCG/2ML IJ SOLN
INTRAMUSCULAR | Status: DC | PRN
  Administered 2023-04-01 (×2): 25 ug via INTRAVENOUS

## 2023-04-01 MED ORDER — CEFAZOLIN SODIUM-DEXTROSE 2-4 GM/100ML-% IV SOLN
INTRAVENOUS | Status: AC
  Administered 2023-04-01: 2 g
  Filled 2023-04-01: qty 100

## 2023-04-01 MED ORDER — SODIUM CHLORIDE 0.9 % IV SOLN
INTRAVENOUS | Status: DC | PRN
  Administered 2023-04-01: 80 mg

## 2023-04-01 MED ORDER — SODIUM CHLORIDE 0.9 % IV SOLN
INTRAVENOUS | Status: DC
  Administered 2023-04-01: 250 mL via INTRAVENOUS

## 2023-04-01 SURGICAL SUPPLY — 8 items
CABLE SURGICAL S-101-97-12 (CABLE) ×1 IMPLANT
IPG PACE AZUR XT DR MRI W1DR01 (Pacemaker) IMPLANT
KIT WRENCH (KITS) IMPLANT
PACE AZURE XT DR MRI W1DR01 (Pacemaker) ×1 IMPLANT
PAD DEFIB RADIO PHYSIO CONN (PAD) ×1 IMPLANT
POUCH AIGIS-R ANTIBACT PPM (Mesh General) ×1 IMPLANT
POUCH AIGIS-R ANTIBACT PPM MED (Mesh General) IMPLANT
TRAY PACEMAKER INSERTION (PACKS) ×1 IMPLANT

## 2023-04-01 NOTE — H&P (Signed)
Patient Care Team: Glori Luis, MD as PCP - General (Family Medicine) [place] Ouch, MD as PCP - Cardiology (Cardiology) Duke Salvia, MD as PCP - Electrophysiology (Cardiology) Carolee Rota, MD as Referring Physician (Allergy) Lupita Leash, MD (Inactive) as Referring Physician (Internal Medicine) Earna Coder, MD as Consulting Physician (Hematology and Oncology) Otho Ket, RN as [facility] Care Management   HPI  Debra Saunders is a 77 y.o. female with pacemaker implanted for sinus node dysfunction complicated by device infection with streptococcus bacteremia and vegetations on the lead with decision >> chronic suppressive ABx  Also aFib"  seen in clinic 9/30>> referred for DCCV was in sinus on 10/4  Device interrogation 9/4>> scant afib-- pts impression is of freq palpitations  Thnks she felt better afger being told she was back in sinus   [employer] device on generator recall,  admitted for genrep  No dyspnea   DATE TEST EF    4/19 LHC   No obstruc CAD  3/21 Echo  60-65% Pa PRESS 50   1/23 Echo  60-65% RVSP 51 RV failure  8/23 RHC   Nl filling pressures    Date Cr K Hgb TSH LFTs  2/21 1.16 3.8 8.3       1/23 1.29 3.7  8.6 (iron sat  9%) 0.054 16  8/23   3.3       10/24 1.12 4.5 8.9      Records and Results Reviewed   Past Medical History:  Diagnosis Date   Arthritis    Atrial fibrillation, persistent (HCC)    a. s/p TEE-guided DCCV on 08/20/2017; b. 06/2021 Recurrent Afib-->amio/TEE/DCCV.   CAD (coronary artery disease)    a. 01/2012 Cath: nonobs dzs; b. 04/2014 MV: No ischemia; c. 09/2017 Cath: Minimal nonobs RCA dzs, otw nl cors.   Cardiorenal syndrome    a. 09/2017 Creat rose to 2.7 w/ diuresis-->HD x 2.   Chronic back pain    Chronic combined systolic (congestive) and diastolic (congestive) heart failure (HCC)    a. Dx 2005 @ [facility];  b. 02/2014 Echo: EF 55-60%; c. 07/2017: EF 30-35%; d. 09/2017 Echo: EF 40-45%;  e. 02/2018 Echo: EF 55-60%; f. 08/2019 Echo: EF 60-65%; g. 08/2020 RHC: RA 3, RV 30/1, PA 33/11 (20), PCWP 6; f. 08/2021 Echo: EF 60-65%, no rwma, GrIDD, nl RV fxn, RVSP 40.58mmHg, mod dil LA, mod TR.   Chronic respiratory failure (HCC)    a. on home O2   Gastritis    History of COVID-19    History of endocarditis 08/22/2021   History of intracranial hemorrhage    a. 6/14 described by neurosurgery as "small frontal posttraumatic SAH " - pt was on xarelto @ the time.   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 Chronic LLE edema.   NICM (nonischemic cardiomyopathy) (HCC)    a. 09/2017 Echo: EF 40-45%; b. 09/2017 Cath: minor, insignificant CAD; c. 02/2018 Echo: EF 55-60%, Gr1 DD; d. 08/2019 Echo: EF 60-65%, Gr2 DD.   Obesity    PAH (pulmonary artery hypertension) (HCC)    a. 09/2017 Echo: PASP ; b. 09/2017 RHC: RA 23, RV 84/13, PA 84/29, PCWP 20, CO 3.89, CI 1.89. LVEDP 18; c. 08/2020 RHC: RA 3, RV 30/1, PA 33/11 (20), PCWP 6. CO/CI  6.18/3.36. PVR 2.3 WU.   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 06/2021. Veg on PPM lead. Not candidate for extraction-->s/p 6 wks abx.   Sepsis with metabolic encephalopathy (HCC)    Sleep apnea    Streptococcal bacteremia    Stroke [facility]) 2014   Subarachnoid hemorrhage (HCC) 01/19/2013   Symptomatic bradycardia    a. s/p BSX PPM.   Thyroid nodule    Type II diabetes mellitus (HCC)    Urinary incontinence     Past Surgical History:  Procedure Laterality Date   ABDOMINAL HYSTERECTOMY  1969   BREAST BIOPSY Right 10/28/2019   lymph node bx, [place] Bx, pending path    CARDIAC CATHETERIZATION  2013   @ [facility]: No obstructive CAD: Only 20% ostial left circumflex.   CARDIOVERSION N/A 10/12/2017   Procedure: CARDIOVERSION;  Surgeon: Antonieta Iba, MD;  Location: [facility] ORS;  Service:  Cardiovascular;  Laterality: N/A;   CARDIOVERSION N/A 07/04/2021   Procedure: CARDIOVERSION;  Surgeon: Laurey Morale, MD;  Location: [facility] ENDOSCOPY;  Service: Cardiovascular;  Laterality: N/A;   CARDIOVERSION N/A 03/27/2023   Procedure: CARDIOVERSION;  Surgeon: Laurey Morale, MD;  Location: [facility] ORS;  Service: Cardiovascular;  Laterality: N/A;   CATARACT EXTRACTION W/PHACO Right 09/19/2020   Procedure: CATARACT EXTRACTION PHACO AND INTRAOCULAR LENS PLACEMENT (IOC) RIGHT DIABETIC 3.95 00:43.0 9.2%;  Surgeon: Lockie Mola, MD;  Location: [facility];  Service: Ophthalmology;  Laterality: Right;  Pt requests to be last sleep apnea   CATARACT EXTRACTION W/PHACO Left 10/03/2020   Procedure: CATARACT EXTRACTION PHACO AND INTRAOCULAR LENS PLACEMENT (IOC) LEFT DIABETIC;  Surgeon: Lockie Mola, MD;  Location: [facility];  Service: Ophthalmology;  Laterality: Left;  3.25 0:59.3 5.5%   CHOLECYSTECTOMY     COLONOSCOPY WITH PROPOFOL N/A 09/10/2018   Procedure: COLONOSCOPY WITH PROPOFOL;  Surgeon: Scot Jun, MD;  Location: [facility] ENDOSCOPY;  Service: Endoscopy;  Laterality: N/A;   COLONOSCOPY WITH PROPOFOL N/A 10/22/2022   Procedure: COLONOSCOPY WITH PROPOFOL;  Surgeon: Jaynie Collins, DO;  Location: [facility] ENDOSCOPY;  Service: Gastroenterology;  Laterality: N/A;   CYST REMOVAL TRUNK  2002   BACK   DIALYSIS/PERMA CATHETER INSERTION N/A 10/06/2017   Procedure: DIALYSIS/PERMA CATHETER INSERTION;  Surgeon: Renford Dills, MD;  Location: [facility] INVASIVE CV LAB;  Service: Cardiovascular;  Laterality: N/A;   ESOPHAGOGASTRODUODENOSCOPY (EGD) WITH PROPOFOL N/A 10/21/2022   Procedure: ESOPHAGOGASTRODUODENOSCOPY (EGD) WITH PROPOFOL;  Surgeon: Jaynie Collins, DO;  Location: [facility] ENDOSCOPY;  Service: Gastroenterology;  Laterality: N/A;   GIVENS CAPSULE STUDY N/A 10/22/2022   Procedure: GIVENS CAPSULE STUDY;  Surgeon: Jaynie Collins, DO;  Location: [facility] ENDOSCOPY;   Service: Gastroenterology;  Laterality: N/A;  IF NOTHING IS FOUND WITH COLONOSCOPY   INSERT / REPLACE / REMOVE PACEMAKER     lipoma removal  2014   back   PACEMAKER INSERTION  2015   [employer] dual chamber pacemaker implanted by Dr Graciela Husbands for symptomatic bradycardia   PERMANENT PACEMAKER INSERTION N/A 01/09/2014   Procedure: PERMANENT PACEMAKER INSERTION;  Surgeon: [facility] Salvia, MD;  Location: [facility] CATH LAB;  Service: Cardiovascular;  Laterality: N/A;   RIGHT HEART CATH N/A 04/19/2018   Procedure: RIGHT HEART CATH;  Surgeon: Laurey Morale, MD;  Location: [facility] INVASIVE CV LAB;  Service: Cardiovascular;  Laterality: N/A;   RIGHT HEART CATH N/A 08/30/2020   Procedure: RIGHT HEART CATH;  Surgeon: Laurey Morale, MD;  Location: [facility] INVASIVE  CV LAB;  Service: Cardiovascular;  Laterality: N/A;   RIGHT HEART CATH N/A 02/11/2022   Procedure: RIGHT HEART CATH;  Surgeon: Laurey Morale, MD;  Location: [facility] INVASIVE CV LAB;  Service: Cardiovascular;  Laterality: N/A;   RIGHT/LEFT HEART CATH AND CORONARY ANGIOGRAPHY N/A 10/19/2017   Procedure: RIGHT/LEFT HEART CATH AND CORONARY ANGIOGRAPHY;  Surgeon: Iran Ouch, MD;  Location: [facility] INVASIVE CV LAB;  Service: Cardiovascular;  Laterality: N/A;   TEE WITHOUT CARDIOVERSION N/A 08/20/2017   Procedure: TRANSESOPHAGEAL ECHOCARDIOGRAM (TEE) & Direct current cardioversion;  Surgeon: Antonieta Iba, MD;  Location: [facility] ORS;  Service: Cardiovascular;  Laterality: N/A;   TEE WITHOUT CARDIOVERSION N/A 06/28/2021   Procedure: TRANSESOPHAGEAL ECHOCARDIOGRAM (TEE);  Surgeon: Antonieta Iba, MD;  Location: [facility] ORS;  Service: Cardiovascular;  Laterality: N/A;   TEE WITHOUT CARDIOVERSION N/A 07/04/2021   Procedure: TRANSESOPHAGEAL ECHOCARDIOGRAM (TEE);  Surgeon: Laurey Morale, MD;  Location: [facility] ENDOSCOPY;  Service: Cardiovascular;  Laterality: N/A;   TRIGGER FINGER RELEASE      Current Facility-Administered Medications  Medication Dose Route Frequency  Provider Last Rate Last Admin   chlorhexidine (HIBICLENS) 4 % liquid 4 Application  4 Application Topical Once [facility] Salvia, MD        Allergies  Allergen Reactions   Aspirin Anaphylaxis and Hives    Pt states that she is unable to take because she had bleeding in her brain   Other Other (See Comments)    Pt states that she is unable to take blood thinners because she had bleeding in her brain; Blood Thinners-per doctor at [facility] (Xarelto)      Social History   Tobacco Use   Smoking status: Former    Current packs/day: 0.00    Average packs/day: 0.3 packs/day for 2.0 years (0.6 ttl pk-yrs)    Types: Cigarettes    Start date: 06/23/1988    Quit date: 06/23/1990    Years since quitting: 32.7    Passive exposure: Past   Smokeless tobacco: Never  Vaping Use   Vaping status: Never Used  Substance Use Topics   Alcohol use: No   Drug use: No     Family History  Problem Relation Age of Onset   Heart disease Mother    Hypertension Mother    COPD Mother        was a smoker   Thyroid disease Mother    Hypertension Father    Hypertension Sister    Thyroid disease Sister    Breast cancer Sister    Hypertension Sister    Thyroid disease Sister    Bone cancer Sister    Stroke Son    Breast cancer Maternal Aunt    Kidney disease Neg Hx    Bladder Cancer Neg Hx      Current Meds  Medication Sig   acetaminophen (TYLENOL) 325 MG tablet Take 325-650 mg by mouth every 6 (six) hours as needed for mild pain.   albuterol (ACCUNEB) 0.63 MG/3ML nebulizer solution Take 1 ampule by nebulization every 6 (six) hours as needed for wheezing.   apixaban (ELIQUIS) 5 MG TABS tablet Take 1 tablet (5 mg total) by mouth 2 (two) times daily.   atropine 1 % ophthalmic solution Place 1 drop into both eyes at bedtime.   cefadroxil (DURICEF) 500 MG capsule Take 1 capsule (500 mg total) by mouth 2 (two) times daily.   cholestyramine (QUESTRAN) 4 g packet Take 1 packet by mouth daily.    dapagliflozin  propanediol (FARXIGA) 10 MG TABS tablet Take 1 tablet (10 mg total) by mouth daily before breakfast.   gabapentin (NEURONTIN) 600 MG tablet Take 600 mg by mouth at bedtime.   lidocaine (LIDODERM) 5 % Place 2 patches onto the skin daily. Remove & Discard patch within 12 hours or as directed by MD (Patient not taking: Reported on 03/24/2023)   macitentan (OPSUMIT) 10 MG tablet Take 1 tablet (10 mg total) by mouth daily.   Menthol, Topical Analgesic, (BIOFREEZE EX) Apply 1 application topically 4 (four) times daily as needed (for pain).   midodrine (PROAMATINE) 5 MG tablet Take one tablet at 12 noon, 4pm and 8pm   potassium chloride SA (KLOR-CON M) 20 MEQ tablet Take every morning and 20mg  every evening.   rosuvastatin (CRESTOR) 10 MG tablet Take 1 tablet by mouth once daily   Selexipag (UPTRAVI) 1600 MCG TABS Take 1 tablet (1,600 mcg total) by mouth 2 (two) times daily.   sildenafil (REVATIO) 20 MG tablet Take 1 tablet (20 mg total) by mouth 3 (three) times daily.   torsemide (DEMADEX) 20 MG tablet Take 40mg  every morning and take 20mg  every evening.   [DISCONTINUED] potassium chloride SA (KLOR-CON M) 20 MEQ tablet Take 40 mEq by mouth daily.   [DISCONTINUED] torsemide (DEMADEX) 20 MG tablet Take 2 tablets (40 mg total) by mouth daily.     Review of Systems negative except from HPI and PMH  Physical Exam BP (!) 141/90   Pulse 70   Temp 98.1 F (36.7 C) (Oral)   Resp 18   Ht 5' (1.524 m)   Wt 90.3 kg   SpO2 97%   BMI 38.86 kg/m  Well developed and well nourished in no acute distress HENT normal E scleral and icterus clear Neck Supple JVP flat; carotids brisk and full Clear to ausculation Regular rate and rhythm, no murmurs gallops or rub Soft with active bowel sounds No clubbing cyanosis  Edema Alert and oriented, grossly normal motor and sensory function Skin Warm and Dry    Assessment and  Plan Sinus bradycardia/Chronotropic incompetence   Atrial  fibrillation-persistent requiring cardioversion   Atrial tachycardia non sustained with rates 200 msec (ie nonsustained AFib)   Dyspnea on exertion   Cardiac implantable device infection-Streptococcus-chronic   Pacemaker [employer]      HFpEF   Hypertension and orthostatic hypotension   Anemia-chronic   End-of-life discussion   For device generator replacement. We have reviewed the benefits and risks of generator replacement.  These include but are not limited to lead fracture and infection.  The patient understands, agrees and is willing to proceed.   Will use pouch to try and reduce likelihood of pocket seeding  With palpitations will use 14 day ZIO at discharge to try and clarify mechanism  Has had significant atrial fib in past, prev on amio, stopped 9/23 per DrDM given worsening dyspnea with normal RHC/filling pressures.   Could consider dofetilide.\  .

## 2023-04-01 NOTE — Discharge Instructions (Addendum)

## 2023-04-02 ENCOUNTER — Encounter (HOSPITAL_COMMUNITY): Payer: Self-pay | Admitting: Internal Medicine

## 2023-04-02 ENCOUNTER — Encounter: Payer: Self-pay | Admitting: Internal Medicine

## 2023-04-02 MED FILL — Midazolam HCl Inj 2 MG/2ML (Base Equivalent): INTRAMUSCULAR | Qty: 2 | Status: AC

## 2023-04-03 ENCOUNTER — Ambulatory Visit: Payer: Self-pay

## 2023-04-03 NOTE — Patient Outreach (Signed)
Care Coordination   04/03/2023 Name: CIRCE IWAI MRN: [id number] DOB: 08-24-1945   Care Coordination Outreach Attempts:  Successful contact made with patient. Patient request call back at another time due to still being asleep.  Agreeable to call next week.   Follow Up Plan:  Additional outreach attempts will be made to offer the patient care coordination information and services.   Encounter Outcome:  Patient Request to Call Back   Care Coordination Interventions:  No, not indicated   George Ina [facility] Care Coordination [phone] direct line

## 2023-04-09 ENCOUNTER — Ambulatory Visit: Payer: Medicare HMO | Attending: Cardiology

## 2023-04-09 DIAGNOSIS — I495 Sick sinus syndrome: Secondary | ICD-10-CM | POA: Diagnosis not present

## 2023-04-09 LAB — CUP PACEART INCLINIC DEVICE CHECK
Date Time Interrogation Session: 20241017133009
Implantable Lead Connection Status: 753985
Implantable Lead Connection Status: 753985
Implantable Lead Implant Date: 20150720
Implantable Lead Implant Date: 20150720
Implantable Lead Location: 753859
Implantable Lead Location: 753860
Implantable Lead Model: 5076
Implantable Lead Model: 5076
Implantable Pulse Generator Implant Date: 20241009

## 2023-04-09 NOTE — Progress Notes (Signed)
Wound check appointment. Dermabond removed. Wound without has small amount of swelling, firm on palpitation. + Eliquis on MAR. Mardelle Matte, PA-C in to review and spoke to Dr. Graciela Husbands who advised pressure dressing to be applied and return OV next Monday. Do NOT hold Eliquis. Pressure dressing applied by myself and wound recheck in [place] office scheduled 04/13/23. Incision edges approximated. Normal device function. Thresholds, sensing, and impedances consistent with implant measurements. Device programmed at chronic outputs d/t chronic leads. Histogram distribution appropriate for patient and level of activity. AT/AF burden 61.9%, EGM's appear AF w/ RVR w/ max. ventricular rates 182 bpm. 1 NSVT event logged, <20 beats in duration. Patient educated about wound care, arm mobility. ROV Monday 04/13/23.

## 2023-04-09 NOTE — Patient Instructions (Addendum)
   After Your Pacemaker   Monitor your pacemaker site for redness, swelling, and drainage. Call the device clinic at [phone] if you experience these symptoms or fever/chills.  We have applied a pressure dressing to your wound site due to small hematoma and swelling at site.  You have appointment with Dr. Graciela Husbands on Monday, October 21st at 2:30 in [place].   No showers, do not get the area wet until we re-evaluate on Monday.  Leave pressure dressing in place   There are no other restrictions in arm movement after your wound check appointment.  Remote monitoring is used to monitor your pacemaker from home. This monitoring is scheduled every 91 days by our office. It allows [place] to keep an eye on the functioning of your device to ensure it is working properly. You will routinely see your Electrophysiologist annually (more often if necessary).

## 2023-04-10 DIAGNOSIS — E1122 Type 2 diabetes mellitus with diabetic chronic kidney disease: Secondary | ICD-10-CM | POA: Diagnosis not present

## 2023-04-10 DIAGNOSIS — N189 Chronic kidney disease, unspecified: Secondary | ICD-10-CM | POA: Diagnosis not present

## 2023-04-10 DIAGNOSIS — J449 Chronic obstructive pulmonary disease, unspecified: Secondary | ICD-10-CM | POA: Diagnosis not present

## 2023-04-10 DIAGNOSIS — D631 Anemia in chronic kidney disease: Secondary | ICD-10-CM | POA: Diagnosis not present

## 2023-04-10 DIAGNOSIS — I4891 Unspecified atrial fibrillation: Secondary | ICD-10-CM | POA: Diagnosis not present

## 2023-04-10 DIAGNOSIS — J9611 Chronic respiratory failure with hypoxia: Secondary | ICD-10-CM | POA: Diagnosis not present

## 2023-04-10 DIAGNOSIS — J8410 Pulmonary fibrosis, unspecified: Secondary | ICD-10-CM | POA: Diagnosis not present

## 2023-04-10 DIAGNOSIS — I130 Hypertensive heart and chronic kidney disease with heart failure and stage 1 through stage 4 chronic kidney disease, or unspecified chronic kidney disease: Secondary | ICD-10-CM | POA: Diagnosis not present

## 2023-04-10 DIAGNOSIS — I5042 Chronic combined systolic (congestive) and diastolic (congestive) heart failure: Secondary | ICD-10-CM | POA: Diagnosis not present

## 2023-04-13 ENCOUNTER — Encounter: Payer: Self-pay | Admitting: Internal Medicine

## 2023-04-13 ENCOUNTER — Ambulatory Visit: Payer: Medicare HMO | Attending: Internal Medicine | Admitting: Internal Medicine

## 2023-04-13 VITALS — BP 107/64 | HR 82 | Ht 60.0 in | Wt 196.8 lb

## 2023-04-13 DIAGNOSIS — Z950 Presence of cardiac pacemaker: Secondary | ICD-10-CM

## 2023-04-13 NOTE — Patient Instructions (Signed)
Next Tuesday in the AM you will come in to see [place] here for wound check. NO shower until then.  Medication Instructions:  No changes at this time.   *If you need a refill on your cardiac medications before your next appointment, please call your pharmacy*   Lab Work: None  If you have labs (blood work) drawn today and your tests are completely normal, you will receive your results only by: MyChart Message (if you have MyChart) OR A paper copy in the mail If you have any lab test that is abnormal or we need to change your treatment, we will call you to review the results.   Testing/Procedures: None   Follow-Up: At [facility], you and your health needs are our priority.  As part of our continuing mission to provide you with exceptional heart care, we have created designated Provider Care Teams.  These Care Teams include your primary Cardiologist (physician) and Advanced Practice Providers (APPs -  Physician Assistants and Nurse Practitioners) who all work together to provide you with the care you need, when you need it.   Your next appointment:   3 month(s)  Provider:   Sherie Don, NP

## 2023-04-13 NOTE — Progress Notes (Signed)
Patient returned to clinic today 4 days post pressure dressing applied to hematoma at device implant site.  Device implant (gen change only) on 04/01/2023.  Patient continues on Eliquis due to high CHADSVASC score, risk too great to hold.  Pressure dressing removed.  Wound edges well approximated, no drainage, no evidence of infection.  There is still noted hematoma at sight; however, is small and does show improvement since last week.  Pain and tenderness also much less at visit today.  Reviewed with Dr. Graciela Husbands.    Plan: I re-applied pressure dressing, patient to take Tylenol this pm and PRN for pain.  Will recheck next Tuesday back in office.  Patient tolerated procedure well although did express pressure and pain which is to be expected.  She and daughter will continue to monitor site for any concerns pain/drainage, fever, chills, and call directly to device clinic if any concerns.  She is aware that she still cannot shower.

## 2023-04-13 NOTE — Progress Notes (Signed)
Patient Care Team: Glori Luis, MD as PCP - General (Family Medicine) [place] Ouch, MD as PCP - Cardiology (Cardiology) [facility] Salvia, MD as PCP - Electrophysiology (Cardiology) Carolee Rota, MD as Referring Physician (Allergy) Lupita Leash, MD (Inactive) as Referring Physician (Internal Medicine) Earna Coder, MD as Consulting Physician (Hematology and Oncology) Otho Ket, RN as [facility] Care Management   HPI  Debra Saunders is a 77 y.o. female Seen in followup for [employer] pacemaker implanted 7/15 for bradycardia and chronotropic incompetence.  Device  complicated by device infection with streptococcus bacteremia and vegetations on the lead with decision >> chronic suppressive ABx underwent generator replacement because of recall on the [facility] [employer] device 9/24  Postoperative course complicated by pocket hematoma in the setting of ongoing anticoagulation.  Pressure dressing applied; comes in today for the assessment.  Less pain less swelling still some there.  Chronic dyspnea, unchanged.  Mostly nonambulatory.  Pulmonary hypertension has been improved with therapy with sildenafil and macitentan .  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       s. DATE TEST EF    4/19 LHC   No obstruc CAD  3/21 Echo  60-65% Pa PRESS 50   1/23 Echo  60-65% RVSP 51 RV failure  8/23 RHC  Nl filling pressures    Date Cr K Hgb TSH LFTs  2/21 1.16 3.8 8.3       1/23 1.29 3.7  8.6 (iron sat  9%) 0.054 16  8/23   3.3        10/24 1.12 4.5 8.9        Thromboembolic risk factors ( age -13 , HTN-1, TIA/CVA-2, DM-1,gender-1 ) for a CHADSVASc Score of .=7    Records and Results Reviewed   Past Medical History:  Diagnosis Date   Arthritis    Atrial fibrillation, persistent (HCC)    a. s/p  TEE-guided DCCV on 08/20/2017; b. 06/2021 Recurrent Afib-->amio/TEE/DCCV.   CAD (coronary artery disease)    a. 01/2012 Cath: nonobs dzs; b. 04/2014 MV: No ischemia; c. 09/2017 Cath: Minimal nonobs RCA dzs, otw nl cors.   Cardiorenal syndrome    a. 09/2017 Creat rose to 2.7 w/ diuresis-->HD x 2.   Chronic back pain    Chronic combined systolic (congestive) and diastolic (congestive) heart failure (HCC)    a. Dx 2005 @ [facility];  b. 02/2014 Echo: EF 55-60%; c. 07/2017: EF 30-35%; d. 09/2017 Echo: EF 40-45%; e. 02/2018 Echo: EF 55-60%; f. 08/2019 Echo: EF 60-65%; g. 08/2020 RHC: RA 3, RV 30/1, PA 33/11 (20), PCWP 6; f. 08/2021 Echo: EF 60-65%, no rwma, GrIDD, nl RV fxn, RVSP 40.5mmHg, mod dil LA, mod TR.   Chronic respiratory failure (HCC)    a. on home O2   Gastritis    History of COVID-19    History of endocarditis 08/22/2021   History of intracranial hemorrhage    a. 6/14 described by neurosurgery as "small frontal posttraumatic SAH " - pt was on xarelto @ the time.   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 Chronic LLE edema.   NICM (nonischemic cardiomyopathy) (HCC)    a. 09/2017 Echo: EF  40-45%; b. 09/2017 Cath: minor, insignificant CAD; c. 02/2018 Echo: EF 55-60%, Gr1 DD; d. 08/2019 Echo: EF 60-65%, Gr2 DD.   Obesity    PAH (pulmonary artery hypertension) (HCC)    a. 09/2017 Echo: PASP ; b. 09/2017 RHC: RA 23, RV 84/13, PA 84/29, PCWP 20, CO 3.89, CI 1.89. LVEDP 18; c. 08/2020 RHC: RA 3, RV 30/1, PA 33/11 (20), PCWP 6. CO/CI 6.18/3.36. PVR 2.3 WU.   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 06/2021. Veg on PPM lead. Not candidate for extraction-->s/p 6 wks abx.   Sepsis with metabolic encephalopathy (HCC)    Sleep apnea    Streptococcal bacteremia    Stroke [facility]) 2014   Subarachnoid hemorrhage (HCC) 01/19/2013    Symptomatic bradycardia    a. s/p BSX PPM.   Thyroid nodule    Type II diabetes mellitus (HCC)    Urinary incontinence     Past Surgical History:  Procedure Laterality Date   ABDOMINAL HYSTERECTOMY  1969   BREAST BIOPSY Right 10/28/2019   lymph node bx, [place] Bx, pending path    CARDIAC CATHETERIZATION  2013   @ [facility]: No obstructive CAD: Only 20% ostial left circumflex.   CARDIOVERSION N/A 10/12/2017   Procedure: CARDIOVERSION;  Surgeon: Antonieta Iba, MD;  Location: [facility] ORS;  Service: Cardiovascular;  Laterality: N/A;   CARDIOVERSION N/A 07/04/2021   Procedure: CARDIOVERSION;  Surgeon: Laurey Morale, MD;  Location: [facility] ENDOSCOPY;  Service: Cardiovascular;  Laterality: N/A;   CARDIOVERSION N/A 03/27/2023   Procedure: CARDIOVERSION;  Surgeon: Laurey Morale, MD;  Location: [facility] ORS;  Service: Cardiovascular;  Laterality: N/A;   CATARACT EXTRACTION W/PHACO Right 09/19/2020   Procedure: CATARACT EXTRACTION PHACO AND INTRAOCULAR LENS PLACEMENT (IOC) RIGHT DIABETIC 3.95 00:43.0 9.2%;  Surgeon: Lockie Mola, MD;  Location: [facility] SURGERY CNTR;  Service: Ophthalmology;  Laterality: Right;  Pt requests to be last sleep apnea   CATARACT EXTRACTION W/PHACO Left 10/03/2020   Procedure: CATARACT EXTRACTION PHACO AND INTRAOCULAR LENS PLACEMENT (IOC) LEFT DIABETIC;  Surgeon: Lockie Mola, MD;  Location: [facility] SURGERY CNTR;  Service: Ophthalmology;  Laterality: Left;  3.25 0:59.3 5.5%   CHOLECYSTECTOMY     COLONOSCOPY WITH PROPOFOL N/A 09/10/2018   Procedure: COLONOSCOPY WITH PROPOFOL;  Surgeon: Scot Jun, MD;  Location: [facility] ENDOSCOPY;  Service: Endoscopy;  Laterality: N/A;   COLONOSCOPY WITH PROPOFOL N/A 10/22/2022   Procedure: COLONOSCOPY WITH PROPOFOL;  Surgeon: Jaynie Collins, DO;  Location: [facility] ENDOSCOPY;  Service: Gastroenterology;  Laterality: N/A;   CYST REMOVAL TRUNK  2002   BACK   DIALYSIS/PERMA CATHETER INSERTION N/A 10/06/2017   Procedure: DIALYSIS/PERMA  CATHETER INSERTION;  Surgeon: Renford Dills, MD;  Location: [facility] INVASIVE CV LAB;  Service: Cardiovascular;  Laterality: N/A;   ESOPHAGOGASTRODUODENOSCOPY (EGD) WITH PROPOFOL N/A 10/21/2022   Procedure: ESOPHAGOGASTRODUODENOSCOPY (EGD) WITH PROPOFOL;  Surgeon: Jaynie Collins, DO;  Location: [facility] ENDOSCOPY;  Service: Gastroenterology;  Laterality: N/A;   GIVENS CAPSULE STUDY N/A 10/22/2022   Procedure: GIVENS CAPSULE STUDY;  Surgeon: Jaynie Collins, DO;  Location: [facility] ENDOSCOPY;  Service: Gastroenterology;  Laterality: N/A;  IF NOTHING IS FOUND WITH COLONOSCOPY   INSERT / REPLACE / REMOVE PACEMAKER     lipoma removal  2014   back   PACEMAKER INSERTION  2015   [employer] dual chamber pacemaker implanted by Dr Graciela Husbands for symptomatic bradycardia   PERMANENT PACEMAKER INSERTION N/A 01/09/2014   Procedure: PERMANENT PACEMAKER INSERTION;  Surgeon: [facility] Salvia, MD;  Location: [facility] CATH LAB;  Service: Cardiovascular;  Laterality: N/A;   PPM GENERATOR CHANGEOUT N/A 04/01/2023   Procedure: PPM GENERATOR CHANGEOUT;  Surgeon: [facility] Salvia, MD;  Location: [facility] INVASIVE CV LAB;  Service: Cardiovascular;  Laterality: N/A;   RIGHT HEART CATH N/A 04/19/2018   Procedure: RIGHT HEART CATH;  Surgeon: Laurey Morale, MD;  Location: [facility] INVASIVE CV LAB;  Service: Cardiovascular;  Laterality: N/A;   RIGHT HEART CATH N/A 08/30/2020   Procedure: RIGHT HEART CATH;  Surgeon: Laurey Morale, MD;  Location: [facility] INVASIVE CV LAB;  Service: Cardiovascular;  Laterality: N/A;   RIGHT HEART CATH N/A 02/11/2022   Procedure: RIGHT HEART CATH;  Surgeon: Laurey Morale, MD;  Location: [facility] INVASIVE CV LAB;  Service: Cardiovascular;  Laterality: N/A;   RIGHT/LEFT HEART CATH AND CORONARY ANGIOGRAPHY N/A 10/19/2017   Procedure: RIGHT/LEFT HEART CATH AND CORONARY ANGIOGRAPHY;  Surgeon: Iran Ouch, MD;  Location: [facility] INVASIVE CV LAB;  Service: Cardiovascular;  Laterality: N/A;   TEE WITHOUT CARDIOVERSION N/A  08/20/2017   Procedure: TRANSESOPHAGEAL ECHOCARDIOGRAM (TEE) & Direct current cardioversion;  Surgeon: Antonieta Iba, MD;  Location: [facility] ORS;  Service: Cardiovascular;  Laterality: N/A;   TEE WITHOUT CARDIOVERSION N/A 06/28/2021   Procedure: TRANSESOPHAGEAL ECHOCARDIOGRAM (TEE);  Surgeon: Antonieta Iba, MD;  Location: [facility] ORS;  Service: Cardiovascular;  Laterality: N/A;   TEE WITHOUT CARDIOVERSION N/A 07/04/2021   Procedure: TRANSESOPHAGEAL ECHOCARDIOGRAM (TEE);  Surgeon: Laurey Morale, MD;  Location: [facility] ENDOSCOPY;  Service: Cardiovascular;  Laterality: N/A;   TRIGGER FINGER RELEASE      No outpatient medications have been marked as taking for the 04/13/23 encounter (Office Visit) with [facility] Salvia, MD.    Allergies  Allergen Reactions   Aspirin Anaphylaxis and Hives    Pt states that she is unable to take because she had bleeding in her brain   Other Other (See Comments)    Pt states that she is unable to take blood thinners because she had bleeding in her brain; Blood Thinners-per doctor at [facility] (Xarelto)      Review of Systems negative except from HPI and PMH  Physical Exam BP 107/64 (BP Location: Left Arm, Patient Position: Sitting, Cuff Size: Normal)   Pulse 82   Ht 5' (1.524 m)   Wt 196 lb 12.8 oz (89.3 kg)   SpO2 (!) 86%   BMI 38.43 kg/m  Well developed and well nourished in no acute distress HENT normal Neck supple   Clear Device pocket healing well.  There is a mild amount of swelling, soft and nontender Regular rate and rhythm,2/6 murmur Abd-soft with active BS No Clubbing cyanosis 3+ edema Skin-warm and dry A & Oriented  Grossly normal sensory and motor function  ECG NONE  Device function is normal.  NOT ASSESSED TODAY  ECG AV pacing at 60  Estimated Creatinine Clearance: 40.5 mL/min (A) (by C-G formula based on SCr of 1.12 mg/dL (H)).   Assessment and  Plan  Chronotropic incompetence   Sinus bradycardia  Atrial  fibrillation-persistent requiring cardioversion   Atrial tachycardia non sustained with rates 200 msec (ie nonsustained AFib)   Dyspnea on exertion   Cardiac implantable device infection-Streptococcus-chronic  Pacemaker [employer]      HFpEF   Hypertension and orthostatic hypotension   Anemia-iron deficiency  Hyperthyroidism  High risk medication surveillance-amiodarone  End-of-life discussion  Post generator replacement pocket hematoma-small.  Given her need for anticoa

## 2023-04-14 ENCOUNTER — Telehealth: Payer: Self-pay

## 2023-04-14 NOTE — Telephone Encounter (Signed)
Pt's daughter brought FMLA forms to complete. Forms have been completed and faxed to [facility] [phone]. Daughter informed.

## 2023-04-15 DIAGNOSIS — J449 Chronic obstructive pulmonary disease, unspecified: Secondary | ICD-10-CM | POA: Diagnosis not present

## 2023-04-15 DIAGNOSIS — I5042 Chronic combined systolic (congestive) and diastolic (congestive) heart failure: Secondary | ICD-10-CM | POA: Diagnosis not present

## 2023-04-15 DIAGNOSIS — J8410 Pulmonary fibrosis, unspecified: Secondary | ICD-10-CM | POA: Diagnosis not present

## 2023-04-15 DIAGNOSIS — D631 Anemia in chronic kidney disease: Secondary | ICD-10-CM | POA: Diagnosis not present

## 2023-04-15 DIAGNOSIS — I4891 Unspecified atrial fibrillation: Secondary | ICD-10-CM | POA: Diagnosis not present

## 2023-04-15 DIAGNOSIS — E1122 Type 2 diabetes mellitus with diabetic chronic kidney disease: Secondary | ICD-10-CM | POA: Diagnosis not present

## 2023-04-15 DIAGNOSIS — N189 Chronic kidney disease, unspecified: Secondary | ICD-10-CM | POA: Diagnosis not present

## 2023-04-15 DIAGNOSIS — J9611 Chronic respiratory failure with hypoxia: Secondary | ICD-10-CM | POA: Diagnosis not present

## 2023-04-15 DIAGNOSIS — I130 Hypertensive heart and chronic kidney disease with heart failure and stage 1 through stage 4 chronic kidney disease, or unspecified chronic kidney disease: Secondary | ICD-10-CM | POA: Diagnosis not present

## 2023-04-21 ENCOUNTER — Encounter: Payer: Self-pay | Admitting: Internal Medicine

## 2023-04-21 ENCOUNTER — Ambulatory Visit: Payer: Medicare HMO | Attending: Internal Medicine | Admitting: Internal Medicine

## 2023-04-21 VITALS — BP 100/60 | HR 85 | Ht 60.0 in | Wt 202.0 lb

## 2023-04-21 DIAGNOSIS — I10 Essential (primary) hypertension: Secondary | ICD-10-CM

## 2023-04-21 DIAGNOSIS — I495 Sick sinus syndrome: Secondary | ICD-10-CM

## 2023-04-21 DIAGNOSIS — E785 Hyperlipidemia, unspecified: Secondary | ICD-10-CM

## 2023-04-21 DIAGNOSIS — I4819 Other persistent atrial fibrillation: Secondary | ICD-10-CM | POA: Diagnosis not present

## 2023-04-21 DIAGNOSIS — I5032 Chronic diastolic (congestive) heart failure: Secondary | ICD-10-CM

## 2023-04-21 NOTE — Patient Instructions (Signed)
Medication Instructions:  The current medical regimen is effective;  continue present plan and medications.  *If you need a refill on your cardiac medications before your next appointment, please call your pharmacy*   Follow-Up: At [facility], you and your health needs are our priority.  As part of our continuing mission to provide you with exceptional heart care, we have created designated Provider Care Teams.  These Care Teams include your primary Cardiologist (physician) and Advanced Practice Providers (APPs -  Physician Assistants and Nurse Practitioners) who all work together to provide you with the care you need, when you need it.  We recommend signing up for the patient portal called "MyChart".  Sign up information is provided on this After Visit Summary.  MyChart is used to connect with patients for Virtual Visits (Telemedicine).  Patients are able to view lab/test results, encounter notes, upcoming appointments, etc.  Non-urgent messages can be sent to your provider as well.   To learn more about what you can do with MyChart, go to [email or web address].    Your next appointment:   Keep scheduled appointment

## 2023-04-21 NOTE — Progress Notes (Signed)
Followup for post implant small hematoma. Pressure dressing applied last time.  Unfortunately it came off.  Hematoma has not changed at all.  Okay to resume anticoagulant patient

## 2023-05-19 ENCOUNTER — Encounter: Payer: Medicare HMO | Admitting: Cardiology

## 2023-05-26 ENCOUNTER — Telehealth: Payer: Self-pay | Admitting: Internal Medicine

## 2023-05-26 NOTE — Telephone Encounter (Signed)
Pt called to r/s appt to January and stated she cannot make any appts in December. The 12/5 appts have been canceled and the 12/16 appts have been r/s so that she sees NP.

## 2023-05-27 ENCOUNTER — Inpatient Hospital Stay: Payer: Medicare HMO

## 2023-05-27 ENCOUNTER — Inpatient Hospital Stay: Payer: Medicare HMO | Attending: Internal Medicine

## 2023-05-27 ENCOUNTER — Inpatient Hospital Stay: Payer: Medicare HMO | Admitting: Internal Medicine

## 2023-05-28 ENCOUNTER — Inpatient Hospital Stay: Payer: Medicare HMO

## 2023-06-03 ENCOUNTER — Telehealth (HOSPITAL_COMMUNITY): Payer: Self-pay | Admitting: Pharmacist

## 2023-06-03 ENCOUNTER — Other Ambulatory Visit (HOSPITAL_COMMUNITY): Payer: Self-pay

## 2023-06-03 NOTE — Telephone Encounter (Signed)
Patient Advocate Encounter   Received notification from [facility] that prior authorization for Uptravi and sildenafil are required.   PA submitted on CoverMyMeds Key [id number], [id number] Status is pending   Will continue to follow.   Karle Plumber, PharmD, BCPS, BCCP, CPP Heart Failure Clinic Pharmacist [phone]

## 2023-06-03 NOTE — Telephone Encounter (Signed)
Advanced Heart Failure Patient Advocate Encounter  Prior Authorizations for Uptravi and sildenafil have been approved.    Effective dates: 06/03/23 through 06/22/24  Karle Plumber, PharmD, BCPS, BCCP, CPP Heart Failure Clinic Pharmacist [phone]

## 2023-06-08 ENCOUNTER — Ambulatory Visit: Payer: Medicare HMO

## 2023-06-08 ENCOUNTER — Other Ambulatory Visit: Payer: Medicare HMO

## 2023-06-08 ENCOUNTER — Ambulatory Visit: Payer: Medicare HMO | Admitting: Nurse Practitioner

## 2023-06-23 MED ORDER — MACITENTAN 10 MG PO TABS: 10.0000 mg | ORAL_TABLET | Freq: Every day | ORAL | 11 refills | Status: DC

## 2023-06-23 NOTE — Addendum Note (Signed)
Addended by: Deanie Jupiter, Milagros Reap on: 06/23/2023 02:21 PM   Modules accepted: Orders

## 2023-06-29 ENCOUNTER — Other Ambulatory Visit (HOSPITAL_COMMUNITY): Payer: Self-pay | Admitting: Cardiology

## 2023-06-29 ENCOUNTER — Encounter: Payer: Medicare HMO | Admitting: Cardiology

## 2023-06-29 DIAGNOSIS — E785 Hyperlipidemia, unspecified: Secondary | ICD-10-CM

## 2023-07-07 ENCOUNTER — Inpatient Hospital Stay: Payer: Medicare HMO

## 2023-07-07 ENCOUNTER — Inpatient Hospital Stay: Payer: Medicare HMO | Attending: Internal Medicine

## 2023-07-07 ENCOUNTER — Inpatient Hospital Stay: Payer: Medicare HMO | Admitting: Nurse Practitioner

## 2023-07-14 ENCOUNTER — Ambulatory Visit: Payer: Medicare HMO | Admitting: Cardiology

## 2023-07-16 DIAGNOSIS — J811 Chronic pulmonary edema: Secondary | ICD-10-CM | POA: Diagnosis not present

## 2023-07-16 NOTE — Progress Notes (Signed)
Electrophysiology Clinic Note    Date:  07/17/2023  Patient ID:  Debra, Saunders 10/29/1945, MRN [id number] PCP:  Glori Luis, MD  Cardiologist:  [name], MD HF cardiologist: Marca Ancona, MD Electrophysiologist: Sherryl Manges, MD    Discussed the use of AI scribe software for clinical note transcription with the patient, who gave verbal consent to proceed.   Patient Profile    Chief Complaint: PPM follow-up  History of Present Illness: Debra Saunders is a 78 y.o. female with PMH notable for SND s/p PPM, endocarditis w lead veg not candidate for system extraction on chronic suppressive abx, afib, pulmHTN, HTN, orthostatic hypotension, CKD, DVT, CVA; seen today for Sherryl Manges, MD for routine electrophysiology followup.  She is s/p recent gen change complicated by post-op hematoma.   On follow-up today, she is having chest pain for the past 3 days. Started all of a sudden and has lessened, but remains very painful. She is also more SOB than usual, and having more swelling. Pain is in central chest and radiates to R shoulder and jaw. She called 911 3 days ago and when EMS presented did not proceed to ER because did not want to wait in waiting room. She saw Dr. Karna Christmas yesterday at [facility] pulm.  She continues to take torsemide 40mg  daily and intermittently takes extra 20mg . Has not taken extra torsemide this week. She is on 4L oxygen via [place] currently, which is home dose Continues to take eliquis BID, no missed doses, no bleeding concerns.      Device Information: MDT dual chamber PPM, imp 12/2013; SND Previously had Bos sci generator  Gen change 03/2023   AAD History: Amiodarone, stopped d/t concerns for ILD/pulm fibrosis     ROS:  Please see the history of present illness. All other systems are reviewed and otherwise negative.    Physical Exam    VS:  BP 110/60 (BP Location: Left Arm, Patient Position: Sitting, Cuff Size: Large)   Pulse (!) 54   Ht  5' (1.524 m)   Wt 197 lb 2 oz (89.4 kg)   SpO2 94% Comment: 4 Liters  BMI 38.50 kg/m  BMI: Body mass index is 38.5 kg/m.  Wt Readings from Last 3 Encounters:  07/17/23 196 lb 3.4 oz (89 kg)  07/17/23 197 lb 2 oz (89.4 kg)  04/21/23 202 lb (91.6 kg)     GEN- The patient is well appearing, alert and oriented x 3 today.   Lungs- Clear to ausculation bilaterally, normal work of breathing.  Heart- Regular rate and rhythm, no murmurs, rubs or gallops Extremities- 1-2+ peripheral edema, warm, dry Skin-  device pocket well-healed, no tethering   Device interrogation done today and reviewed by myself:  Battery 13.3 years Lead thresholds, impedence, sensing stable  Presents in AF, appears persistent Intermittently AP d/t lack of atrial sensing Reduced atrial sensing to 0.3mV Elevated ventricular rates    Studies Reviewed   Previous EP, cardiology notes.    EKG is ordered. Personal review of EKG from today shows:    EKG Interpretation Date/Time:  Friday July 17 2023 14:35:02 EST Ventricular Rate:  54 PR Interval:  176 QRS Duration:  194 QT Interval:  540 QTC Calculation: 512 R Axis:   -82  Text Interpretation: AV dual-paced rhythm with frequent ventricular-paced complexes Confirmed by Sherie Don [phone]) on 07/17/2023 2:37:42 PM    RHC, 02/11/2022 1. Normal filling pressures.  2. Normal PA pressure.    TTE,  08/23/2021  1. Left ventricular ejection fraction, by estimation, is 60 to 65%. The left ventricle has normal function. The left ventricle has no regional wall motion abnormalities. There is mild left ventricular hypertrophy. Left ventricular diastolic parameters are consistent with Grade I diastolic dysfunction (impaired relaxation).   2. Right ventricular systolic function is normal. The right ventricular size is normal. There is mildly elevated pulmonary artery systolic pressure. The estimated right ventricular systolic pressure is 40.3 mmHg.   3. Left atrial size  was moderately dilated.   4. The mitral valve is normal in structure. No evidence of mitral valve regurgitation. No evidence of mitral stenosis.   5. Tricuspid valve regurgitation is moderate.   6. The aortic valve is normal in structure. Aortic valve regurgitation is not visualized. Aortic valve sclerosis/calcification is present, without any evidence of aortic stenosis.   7. The inferior vena cava is normal in size with greater than 50% respiratory variability, suggesting right atrial pressure of 3 mmHg.   8. RV pacer wire difficult to visualize, grossly no vegetation noted.   Assessment and Plan    #) chest pain #) increased SOB #) persis AFib Patient had increased chest pain, SOB, with increased edema Also having persistent AFib with RVR at times Recommend proceeding to ER for further evaluation including labs, hsTrop, cxr, +/- DCCV   #) SND s/p PPM #) PPM device infection Device functioning well, see paceart for details     #) Hypercoag d/t afib CHA2DS2-VASc Score = 9 [CHF History: 1, HTN History: 1, Diabetes History: 1, Stroke History: 2, Vascular Disease History: 1, Age Score: 2, Gender Score: 1].  Therefore, the patient's annual risk of stroke is 12.2 %.    Stroke ppx - 5mg  eliquis BID, appropriately dosed No bleeding concerns       Current medicines are reviewed at length with the patient today.   The patient does not have concerns regarding her medicines.  The following changes were made today:  none  Labs/ tests ordered today include:  Orders Placed This Encounter  Procedures   EKG 12-Lead     Disposition: Follow up with Dr. Graciela Husbands or EP APP in 6 months   Signed, Sherie Don, NP  07/17/23  4:01 PM  Electrophysiology [facility]

## 2023-07-17 ENCOUNTER — Emergency Department: Payer: Medicare HMO

## 2023-07-17 ENCOUNTER — Inpatient Hospital Stay
Admission: EM | Admit: 2023-07-17 | Discharge: 2023-07-21 | DRG: 286 | Disposition: A | Discharge status: Home / Self Care | Payer: Medicare HMO | Attending: Internal Medicine | Admitting: Internal Medicine

## 2023-07-17 ENCOUNTER — Other Ambulatory Visit: Payer: Self-pay

## 2023-07-17 ENCOUNTER — Ambulatory Visit: Payer: Medicare HMO | Attending: Cardiology | Admitting: Cardiology

## 2023-07-17 ENCOUNTER — Ambulatory Visit: Admission: RE | Admit: 2023-07-17 | Payer: Medicare HMO | Source: Ambulatory Visit

## 2023-07-17 VITALS — BP 110/60 | HR 54 | Ht 60.0 in | Wt 197.1 lb

## 2023-07-17 DIAGNOSIS — Z9981 Dependence on supplemental oxygen: Secondary | ICD-10-CM

## 2023-07-17 DIAGNOSIS — I495 Sick sinus syndrome: Secondary | ICD-10-CM | POA: Diagnosis present

## 2023-07-17 DIAGNOSIS — I951 Orthostatic hypotension: Secondary | ICD-10-CM | POA: Diagnosis present

## 2023-07-17 DIAGNOSIS — G4733 Obstructive sleep apnea (adult) (pediatric): Secondary | ICD-10-CM | POA: Diagnosis present

## 2023-07-17 DIAGNOSIS — E1122 Type 2 diabetes mellitus with diabetic chronic kidney disease: Secondary | ICD-10-CM | POA: Diagnosis present

## 2023-07-17 DIAGNOSIS — I272 Pulmonary hypertension, unspecified: Secondary | ICD-10-CM | POA: Diagnosis present

## 2023-07-17 DIAGNOSIS — Z950 Presence of cardiac pacemaker: Secondary | ICD-10-CM | POA: Diagnosis not present

## 2023-07-17 DIAGNOSIS — R0602 Shortness of breath: Secondary | ICD-10-CM | POA: Diagnosis not present

## 2023-07-17 DIAGNOSIS — Z87891 Personal history of nicotine dependence: Secondary | ICD-10-CM | POA: Diagnosis not present

## 2023-07-17 DIAGNOSIS — I428 Other cardiomyopathies: Secondary | ICD-10-CM | POA: Diagnosis present

## 2023-07-17 DIAGNOSIS — I5033 Acute on chronic diastolic (congestive) heart failure: Secondary | ICD-10-CM

## 2023-07-17 DIAGNOSIS — J9611 Chronic respiratory failure with hypoxia: Secondary | ICD-10-CM | POA: Diagnosis present

## 2023-07-17 DIAGNOSIS — I2721 Secondary pulmonary arterial hypertension: Secondary | ICD-10-CM | POA: Diagnosis present

## 2023-07-17 DIAGNOSIS — Z825 Family history of asthma and other chronic lower respiratory diseases: Secondary | ICD-10-CM

## 2023-07-17 DIAGNOSIS — Z8349 Family history of other endocrine, nutritional and metabolic diseases: Secondary | ICD-10-CM

## 2023-07-17 DIAGNOSIS — Z888 Allergy status to other drugs, medicaments and biological substances status: Secondary | ICD-10-CM

## 2023-07-17 DIAGNOSIS — Z6837 Body mass index (BMI) 37.0-37.9, adult: Secondary | ICD-10-CM

## 2023-07-17 DIAGNOSIS — Z8673 Personal history of transient ischemic attack (TIA), and cerebral infarction without residual deficits: Secondary | ICD-10-CM

## 2023-07-17 DIAGNOSIS — Z8679 Personal history of other diseases of the circulatory system: Secondary | ICD-10-CM

## 2023-07-17 DIAGNOSIS — E876 Hypokalemia: Secondary | ICD-10-CM | POA: Diagnosis present

## 2023-07-17 DIAGNOSIS — Z79899 Other long term (current) drug therapy: Secondary | ICD-10-CM

## 2023-07-17 DIAGNOSIS — I4819 Other persistent atrial fibrillation: Secondary | ICD-10-CM

## 2023-07-17 DIAGNOSIS — I2510 Atherosclerotic heart disease of native coronary artery without angina pectoris: Secondary | ICD-10-CM | POA: Diagnosis present

## 2023-07-17 DIAGNOSIS — Z8616 Personal history of COVID-19: Secondary | ICD-10-CM | POA: Diagnosis not present

## 2023-07-17 DIAGNOSIS — R079 Chest pain, unspecified: Secondary | ICD-10-CM

## 2023-07-17 DIAGNOSIS — D6869 Other thrombophilia: Secondary | ICD-10-CM

## 2023-07-17 DIAGNOSIS — E785 Hyperlipidemia, unspecified: Secondary | ICD-10-CM

## 2023-07-17 DIAGNOSIS — Z7901 Long term (current) use of anticoagulants: Secondary | ICD-10-CM | POA: Diagnosis not present

## 2023-07-17 DIAGNOSIS — Z993 Dependence on wheelchair: Secondary | ICD-10-CM

## 2023-07-17 DIAGNOSIS — Z886 Allergy status to analgesic agent status: Secondary | ICD-10-CM

## 2023-07-17 DIAGNOSIS — I482 Chronic atrial fibrillation, unspecified: Secondary | ICD-10-CM | POA: Diagnosis not present

## 2023-07-17 DIAGNOSIS — E669 Obesity, unspecified: Secondary | ICD-10-CM | POA: Diagnosis present

## 2023-07-17 DIAGNOSIS — Z8249 Family history of ischemic heart disease and other diseases of the circulatory system: Secondary | ICD-10-CM | POA: Diagnosis not present

## 2023-07-17 DIAGNOSIS — I110 Hypertensive heart disease with heart failure: Principal | ICD-10-CM | POA: Diagnosis present

## 2023-07-17 DIAGNOSIS — J849 Interstitial pulmonary disease, unspecified: Principal | ICD-10-CM | POA: Diagnosis present

## 2023-07-17 DIAGNOSIS — Z9842 Cataract extraction status, left eye: Secondary | ICD-10-CM

## 2023-07-17 DIAGNOSIS — Z823 Family history of stroke: Secondary | ICD-10-CM

## 2023-07-17 DIAGNOSIS — Z961 Presence of intraocular lens: Secondary | ICD-10-CM | POA: Diagnosis present

## 2023-07-17 DIAGNOSIS — R0989 Other specified symptoms and signs involving the circulatory and respiratory systems: Secondary | ICD-10-CM | POA: Diagnosis not present

## 2023-07-17 DIAGNOSIS — I214 Non-ST elevation (NSTEMI) myocardial infarction: Secondary | ICD-10-CM

## 2023-07-17 DIAGNOSIS — Z9841 Cataract extraction status, right eye: Secondary | ICD-10-CM

## 2023-07-17 DIAGNOSIS — I5032 Chronic diastolic (congestive) heart failure: Secondary | ICD-10-CM | POA: Diagnosis not present

## 2023-07-17 DIAGNOSIS — R0789 Other chest pain: Secondary | ICD-10-CM | POA: Diagnosis not present

## 2023-07-17 HISTORY — DX: Acute on chronic diastolic (congestive) heart failure: I50.33

## 2023-07-17 LAB — CUP PACEART INCLINIC DEVICE CHECK
Date Time Interrogation Session: 20250124162213
Implantable Lead Connection Status: 753985
Implantable Lead Connection Status: 753985
Implantable Lead Implant Date: 20150720
Implantable Lead Implant Date: 20150720
Implantable Lead Location: 753859
Implantable Lead Location: 753860
Implantable Lead Model: 5076
Implantable Lead Model: 5076
Implantable Pulse Generator Implant Date: 20241009

## 2023-07-17 LAB — BASIC METABOLIC PANEL
Anion gap: 13 (ref 5–15)
BUN: 15 mg/dL (ref 8–23)
CO2: 31 mmol/L (ref 22–32)
Calcium: 8.8 mg/dL — ABNORMAL LOW (ref 8.9–10.3)
Chloride: 97 mmol/L — ABNORMAL LOW (ref 98–111)
Creatinine, Ser: 0.92 mg/dL (ref 0.44–1.00)
GFR, Estimated: >60 mL/min (ref >60)
Glucose, Bld: 97 mg/dL (ref 70–99)
Potassium: 2.5 mmol/L — CL (ref 3.5–5.1)
Sodium: 141 mmol/L (ref 135–145)

## 2023-07-17 LAB — CBC WITH DIFFERENTIAL/PLATELET
Abs Immature Granulocytes: 0 10*3/uL (ref 0.00–0.07)
Basophils Absolute: 0 10*3/uL (ref 0.0–0.1)
Basophils Relative: 1 %
Eosinophils Absolute: 0.1 10*3/uL (ref 0.0–0.5)
Eosinophils Relative: 3 %
HCT: 34.8 % — ABNORMAL LOW (ref 36.0–46.0)
Hemoglobin: 10.3 g/dL — ABNORMAL LOW (ref 12.0–15.0)
Immature Granulocytes: 0 %
Lymphocytes Relative: 30 %
Lymphs Abs: 1.2 10*3/uL (ref 0.7–4.0)
MCH: 26.1 pg (ref 26.0–34.0)
MCHC: 29.6 g/dL — ABNORMAL LOW (ref 30.0–36.0)
MCV: 88.1 fL (ref 80.0–100.0)
Monocytes Absolute: 0.3 10*3/uL (ref 0.1–1.0)
Monocytes Relative: 7 %
Neutro Abs: 2.3 10*3/uL (ref 1.7–7.7)
Neutrophils Relative %: 59 %
Platelets: 174 10*3/uL (ref 150–400)
RBC: 3.95 MIL/uL (ref 3.87–5.11)
RDW: 14 % (ref 11.5–15.5)
WBC: 3.9 10*3/uL — ABNORMAL LOW (ref 4.0–10.5)
nRBC: 0 % (ref 0.0–0.2)

## 2023-07-17 LAB — TROPONIN I (HIGH SENSITIVITY)
Troponin I (High Sensitivity): 24 ng/L — ABNORMAL HIGH (ref <18)
Troponin I (High Sensitivity): 25 ng/L — ABNORMAL HIGH (ref <18)

## 2023-07-17 LAB — BRAIN NATRIURETIC PEPTIDE: B Natriuretic Peptide: 335.5 pg/mL — ABNORMAL HIGH (ref 0.0–100.0)

## 2023-07-17 MED ORDER — APIXABAN 5 MG PO TABS
5.0000 mg | ORAL_TABLET | Freq: Two times a day (BID) | ORAL | Status: DC
  Administered 2023-07-18: 5 mg via ORAL
  Filled 2023-07-17: qty 1

## 2023-07-17 MED ORDER — ROSUVASTATIN CALCIUM 10 MG PO TABS
10.0000 mg | ORAL_TABLET | Freq: Every day | ORAL | Status: DC
  Administered 2023-07-18 – 2023-07-21 (×3): 10 mg via ORAL
  Filled 2023-07-17 (×3): qty 1

## 2023-07-17 MED ORDER — ONDANSETRON HCL 4 MG PO TABS: 4.0000 mg | ORAL_TABLET | Freq: Four times a day (QID) | ORAL | Status: DC | PRN

## 2023-07-17 MED ORDER — DAPAGLIFLOZIN PROPANEDIOL 10 MG PO TABS
10.0000 mg | ORAL_TABLET | Freq: Every day | ORAL | Status: DC
  Administered 2023-07-19: 10 mg via ORAL
  Filled 2023-07-17 (×5): qty 1

## 2023-07-17 MED ORDER — MECLIZINE HCL 25 MG PO TABS
12.5000 mg | ORAL_TABLET | Freq: Three times a day (TID) | ORAL | Status: DC | PRN
  Administered 2023-07-18: 12.5 mg via ORAL
  Filled 2023-07-17: qty 0.5
  Filled 2023-07-17: qty 1

## 2023-07-17 MED ORDER — SPIRONOLACTONE 25 MG PO TABS
25.0000 mg | ORAL_TABLET | Freq: Every day | ORAL | Status: DC
  Administered 2023-07-19 – 2023-07-21 (×2): 25 mg via ORAL
  Filled 2023-07-17 (×3): qty 1

## 2023-07-17 MED ORDER — POTASSIUM CHLORIDE CRYS ER 20 MEQ PO TBCR
40.0000 meq | EXTENDED_RELEASE_TABLET | Freq: Once | ORAL | Status: AC
  Administered 2023-07-17: 40 meq via ORAL
  Filled 2023-07-17: qty 2

## 2023-07-17 MED ORDER — MAGNESIUM HYDROXIDE 400 MG/5ML PO SUSP: 30.0000 mL | Freq: Every day | ORAL | Status: DC | PRN

## 2023-07-17 MED ORDER — ACETAMINOPHEN 650 MG RE SUPP: 650.0000 mg | Freq: Four times a day (QID) | RECTAL | Status: DC | PRN

## 2023-07-17 MED ORDER — SILDENAFIL CITRATE 20 MG PO TABS
20.0000 mg | ORAL_TABLET | Freq: Three times a day (TID) | ORAL | Status: DC
  Administered 2023-07-18 – 2023-07-21 (×9): 20 mg via ORAL
  Filled 2023-07-17 (×14): qty 1

## 2023-07-17 MED ORDER — POTASSIUM CHLORIDE CRYS ER 20 MEQ PO TBCR: 20.0000 meq | EXTENDED_RELEASE_TABLET | ORAL | Status: DC

## 2023-07-17 MED ORDER — LIDOCAINE 5 % EX PTCH: 2.0000 | MEDICATED_PATCH | CUTANEOUS | Status: DC

## 2023-07-17 MED ORDER — SELEXIPAG 1600 MCG PO TABS
1600.0000 ug | ORAL_TABLET | Freq: Two times a day (BID) | ORAL | Status: DC
  Administered 2023-07-18 – 2023-07-21 (×4): 1600 ug via ORAL

## 2023-07-17 MED ORDER — TRAZODONE HCL 50 MG PO TABS
25.0000 mg | ORAL_TABLET | Freq: Every evening | ORAL | Status: DC | PRN
  Filled 2023-07-17: qty 1

## 2023-07-17 MED ORDER — OXYCODONE HCL 5 MG PO TABS: 5.0000 mg | ORAL_TABLET | ORAL | Status: DC | PRN

## 2023-07-17 MED ORDER — ATROPINE SULFATE 1 % OP SOLN
1.0000 [drp] | Freq: Every day | OPHTHALMIC | Status: DC
  Administered 2023-07-18 – 2023-07-20 (×3): 1 [drp] via OPHTHALMIC
  Filled 2023-07-17: qty 2

## 2023-07-17 MED ORDER — CHOLESTYRAMINE 4 G PO PACK
1.0000 | PACK | Freq: Every day | ORAL | Status: DC
  Administered 2023-07-19: 1 via ORAL
  Filled 2023-07-17 (×5): qty 1

## 2023-07-17 MED ORDER — GABAPENTIN 300 MG PO CAPS
600.0000 mg | ORAL_CAPSULE | Freq: Every day | ORAL | Status: DC
  Administered 2023-07-18 – 2023-07-20 (×3): 600 mg via ORAL
  Filled 2023-07-17 (×3): qty 2

## 2023-07-17 MED ORDER — FUROSEMIDE 10 MG/ML IJ SOLN
40.0000 mg | Freq: Two times a day (BID) | INTRAMUSCULAR | Status: DC
  Administered 2023-07-18 – 2023-07-19 (×4): 40 mg via INTRAVENOUS
  Filled 2023-07-17 (×3): qty 4

## 2023-07-17 MED ORDER — ALBUTEROL SULFATE (2.5 MG/3ML) 0.083% IN NEBU
2.5000 mg | INHALATION_SOLUTION | Freq: Four times a day (QID) | RESPIRATORY_TRACT | Status: DC | PRN
  Administered 2023-07-18 – 2023-07-20 (×3): 2.5 mg via RESPIRATORY_TRACT
  Filled 2023-07-17 (×3): qty 3

## 2023-07-17 MED ORDER — ACETAMINOPHEN 325 MG PO TABS
650.0000 mg | ORAL_TABLET | Freq: Four times a day (QID) | ORAL | Status: DC | PRN
Start: 2023-07-17 — End: 2023-07-21
  Administered 2023-07-17 – 2023-07-21 (×5): 650 mg via ORAL
  Filled 2023-07-17 (×5): qty 2

## 2023-07-17 MED ORDER — MIDODRINE HCL 5 MG PO TABS
5.0000 mg | ORAL_TABLET | Freq: Three times a day (TID) | ORAL | Status: DC
  Administered 2023-07-18 – 2023-07-19 (×5): 5 mg via ORAL
  Filled 2023-07-17 (×5): qty 1

## 2023-07-17 MED ORDER — ONDANSETRON HCL 4 MG/2ML IJ SOLN
4.0000 mg | Freq: Four times a day (QID) | INTRAMUSCULAR | Status: DC | PRN
  Administered 2023-07-18: 4 mg via INTRAVENOUS

## 2023-07-17 MED ORDER — CEFADROXIL 500 MG PO CAPS
500.0000 mg | ORAL_CAPSULE | Freq: Two times a day (BID) | ORAL | Status: DC
  Administered 2023-07-18 – 2023-07-21 (×6): 500 mg via ORAL
  Filled 2023-07-17 (×11): qty 1

## 2023-07-17 MED ORDER — POTASSIUM CHLORIDE 10 MEQ/100ML IV SOLN
10.0000 meq | Freq: Once | INTRAVENOUS | Status: AC
  Administered 2023-07-18: 10 meq via INTRAVENOUS
  Filled 2023-07-17: qty 100

## 2023-07-17 NOTE — ED Provider Triage Note (Signed)
Emergency Medicine Provider Triage Evaluation Note  Debra Saunders , a 78 y.o. female  was evaluated in triage.  Pt complains of CP and SOB. Was seen at cardiology today and was going to have a cardioversion for afib. Wears O2 chronically.  Review of Systems  Positive: CP, SOB, leg swelling Negative:   Physical Exam  There were no vitals taken for this visit. Gen:   Awake, no distress   Resp:  Normal effort  MSK:   Moves extremities without difficulty  Other:  Left leg swelling   Medical Decision Making  Medically screening exam initiated at 3:31 PM.  Appropriate orders placed.  LOY LITTLE was informed that the remainder of the evaluation will be completed by another provider, this initial triage assessment does not replace that evaluation, and the importance of remaining in the ED until their evaluation is complete.    Cameron Ali, PA-C 07/17/23 [phone]

## 2023-07-17 NOTE — ED Notes (Signed)
This RN flagged down by security and advised pt was complaining of Cp. This RN went to check on pt. She is still having CP but no worse than when she got here. Pt made aware she close to being seen.

## 2023-07-17 NOTE — ED Triage Notes (Signed)
Pt to ed from [facility] for CP, and SOB x 1 week. Pt advised she is having a harder time catching her breath at home when she get up and walks around. Pt was there for a routine visit and they felt she needed to be seen here today. Pt did call EMS earlier this week and didn't want to be transported. Pt wears oxygen at home, chronic 4 liters. Pt is caox4, and in minimal discomfort when taking a deep breath.

## 2023-07-17 NOTE — ED Provider Notes (Signed)
[facility] Provider Note    Event Date/Time   First MD Initiated Contact with Patient 07/17/23 2001     (approximate)   History   Chest Pain   HPI  Debra Saunders is a 78 y.o. female with a history of CHF, interstitial lung disease who sees Dr. Audrea Muscat of pulmonary who presents with complaints of chest discomfort, shortness of breath.  Patient wears 4 L nasal cannula chronically, typically is able to walk about 50 m without getting significantly winded over the last several days that has become closer to 5 feet.  Denies fevers or chills, does report lower extremity edema     Physical Exam   Triage Vital Signs: ED Triage Vitals  Encounter Vitals Group     BP 07/17/23 1531 (!) 147/79     Systolic BP Percentile --      Diastolic BP Percentile --      Pulse Rate 07/17/23 1531 65     Resp 07/17/23 1531 (!) 22     Temp 07/17/23 1531 98.2 F (36.8 C)     Temp Source 07/17/23 1531 Oral     SpO2 07/17/23 1531 95 %     Weight 07/17/23 1532 89 kg (196 lb 3.4 oz)     Height 07/17/23 1532 1.524 m (5')     Head Circumference --      Peak Flow --      Pain Score 07/17/23 1531 5     Pain Loc --      Pain Education --      Exclude from Growth Chart --     Most recent vital signs: Vitals:   07/17/23 1531 07/17/23 1924  BP: (!) 147/79 (!) 152/76  Pulse: 65 67  Resp: (!) 22 20  Temp: 98.2 F (36.8 C) 98 F (36.7 C)  SpO2: 95% 95%     General: Awake, no distress.  CV:  Good peripheral perfusion.  No wheezing Resp:  Tachypnea Abd:  No distention.  Other:  Mild edema lower extremities   ED Results / Procedures / Treatments   Labs (all labs ordered are listed, but only abnormal results are displayed) Labs Reviewed  BRAIN NATRIURETIC PEPTIDE - Abnormal; Notable for the following components:      Result Value   B Natriuretic Peptide 335.5 (*)    All other components within normal limits  BASIC METABOLIC PANEL - Abnormal; Notable for the  following components:   Potassium 2.5 (*)    Chloride 97 (*)    Calcium 8.8 (*)    All other components within normal limits  CBC WITH DIFFERENTIAL/PLATELET - Abnormal; Notable for the following components:   WBC 3.9 (*)    Hemoglobin 10.3 (*)    HCT 34.8 (*)    MCHC 29.6 (*)    All other components within normal limits  TROPONIN I (HIGH SENSITIVITY) - Abnormal; Notable for the following components:   Troponin I (High Sensitivity) 24 (*)    All other components within normal limits  TROPONIN I (HIGH SENSITIVITY) - Abnormal; Notable for the following components:   Troponin I (High Sensitivity) 25 (*)    All other components within normal limits  CBC WITH DIFFERENTIAL/PLATELET     EKG  ED ECG REPORT I, Jene Every, the attending physician, personally viewed and interpreted this ECG.  Date: 07/17/2023  Rhythm: Atrial fibrillation QRS Axis: normal Intervals: AB normal ST/T Wave abnormalities: normal Narrative Interpretation: no evidence of acute ischemia  RADIOLOGY Chest x-ray viewed interpret by me, no clear pulmonary edema    PROCEDURES:  Critical Care performed:   Procedures   MEDICATIONS ORDERED IN ED: Medications  potassium chloride SA (KLOR-CON M) CR tablet 40 mEq (has no administration in time range)  potassium chloride 10 mEq in 100 mL IVPB (has no administration in time range)     IMPRESSION / MDM / ASSESSMENT AND PLAN / ED COURSE  I reviewed the triage vital signs and the nursing notes. Patient's presentation is most consistent with severe exacerbation of chronic illness.  Patient presents with increased shortness of breath with any exertion in the setting of chronic of social lung disease, CHF, suspect fluid overload given development of edema, she is also quite hypokalemic.  Will give p.o. and IV potassium prior to starting Lasix  Have discussed with the hospitalist team        FINAL CLINICAL IMPRESSION(S) / ED DIAGNOSES   Final  diagnoses:  Interstitial lung disease (HCC)  SOB (shortness of breath)  Nonspecific chest pain  Acute hypokalemia     Rx / DC Orders   ED Discharge Orders     None        Note:  This document was prepared using Dragon voice recognition software and may include unintentional dictation errors.   Jene Every, MD 07/17/23 2110

## 2023-07-17 NOTE — H&P (Incomplete)
[place]   PATIENT NAME: Debra Saunders    MR#:  [id number]  DATE OF BIRTH:  1945-09-07  DATE OF ADMISSION:  07/17/2023  PRIMARY CARE PHYSICIAN: Glori Luis, MD   Patient is coming from: Home  REQUESTING/REFERRING PHYSICIAN: Jene Every, MD  CHIEF COMPLAINT:   Chief Complaint  Patient presents with   Chest Pain    HISTORY OF PRESENT ILLNESS:  Debra Saunders is a 78 y.o. African-American female with medical history significant for interstitial lung disease, coronary artery disease, atrial fibrillation, combined systolic and diastolic CHF, hypertension, type 2 diabetes mellitus, dyslipidemia and OSA, who presented to the emergency room with acute onset of worsening dyspnea over the last week without significant cough or wheezing.  He she admitted to midsternal chest pain with radiation to her neck and both shoulders and Wednesday and she refused to come in the hospital when EMS arrived to her home.  The patient is on home O2 at 4 L/min by nasal cannula and today she could not walk 5 feet without dyspnea.  She admitted to worsening lower extremity edema as well as orthopnea and paroxysmal nocturnal dyspnea.  She wears a CPAP nightly for her OSA.  No associated nausea vomiting or diaphoresis.  She thought she had low-grade fever without chills.  She was afebrile here.  No dysuria, oliguria or hematuria or flank pain.  No recent travels or surgeries.  She saw Dr. Karna Christmas  yesterday and was thought to have CHF exacerbation with fluid retention.  ED Course: When she came to the ER, heart rate was 54 and respiratory 22 with pulse currently 95% on 4 L O2 per nasal cannula and an BP later on was 147/79.  Labs revealed hypokalemia of 2.5 and hypochloremia 97 with calcium of 8.8.  BNP was 335.5 and high sensitive troponin I was 24 and later 25.[name]  CBC showed leukopenia 3.9 anemia with hemoglobin 10.3 hematocrit 34.8 EKG as reviewed by me : EKG showed AV dual paced rhythm with rate  of 54 with frequent ventricular paced complexes. Imaging: Two-view chest x-ray showed no acute cardiopulmonary disease.  It showed chronic interstitial lung disease.  The patient was given 10 equivalent IV potassium chloride and 40 mill [place]ell p.o. PAST MEDICAL HISTORY:   Past Medical History:  Diagnosis Date   Arthritis    Atrial fibrillation, persistent (HCC)    a. s/p TEE-guided DCCV on 08/20/2017; b. 06/2021 Recurrent Afib-->amio/TEE/DCCV.   CAD (coronary artery disease)    a. 01/2012 Cath: nonobs dzs; b. 04/2014 MV: No ischemia; c. 09/2017 Cath: Minimal nonobs RCA dzs, otw nl cors.   Cardiorenal syndrome    a. 09/2017 Creat rose to 2.7 w/ diuresis-->HD x 2.   Chronic back pain    Chronic combined systolic (congestive) and diastolic (congestive) heart failure (HCC)    a. Dx 2005 @ [facility];  b. 02/2014 Echo: EF 55-60%; c. 07/2017: EF 30-35%; d. 09/2017 Echo: EF 40-45%; e. 02/2018 Echo: EF 55-60%; f. 08/2019 Echo: EF 60-65%; g. 08/2020 RHC: RA 3, RV 30/1, PA 33/11 (20), PCWP 6; f. 08/2021 Echo: EF 60-65%, no rwma, GrIDD, nl RV fxn, RVSP 40.75mmHg, mod dil LA, mod TR.   Chronic respiratory failure (HCC)    a. on home O2   Gastritis    History of COVID-19    History of endocarditis 08/22/2021   History of intracranial hemorrhage    a. 6/14 described by neurosurgery as "small frontal posttraumatic SAH " - pt  was on xarelto @ the time.   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 Chronic LLE edema.   NICM (nonischemic cardiomyopathy) (HCC)    a. 09/2017 Echo: EF 40-45%; b. 09/2017 Cath: minor, insignificant CAD; c. 02/2018 Echo: EF 55-60%, Gr1 DD; d. 08/2019 Echo: EF 60-65%, Gr2 DD.   Obesity    PAH (pulmonary artery hypertension) (HCC)    a. 09/2017 Echo: PASP ; b. 09/2017 RHC: RA 23, RV 84/13, PA 84/29, PCWP 20, CO 3.89, CI 1.89. LVEDP 18; c. 08/2020 RHC: RA 3, RV 30/1, PA  33/11 (20), PCWP 6. CO/CI 6.18/3.36. PVR 2.3 WU.   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 06/2021. Veg on PPM lead. Not candidate for extraction-->s/p 6 wks abx.   Sepsis with metabolic encephalopathy (HCC)    Sleep apnea    Streptococcal bacteremia    Stroke [facility]) 2014   Subarachnoid hemorrhage (HCC) 01/19/2013   Symptomatic bradycardia    a. s/p BSX PPM.   Thyroid nodule    Type II diabetes mellitus (HCC)    Urinary incontinence     PAST SURGICAL HISTORY:   Past Surgical History:  Procedure Laterality Date   ABDOMINAL HYSTERECTOMY  1969   BREAST BIOPSY Right 10/28/2019   lymph node bx, [place] Bx, pending path    CARDIAC CATHETERIZATION  2013   @ [facility]: No obstructive CAD: Only 20% ostial left circumflex.   CARDIOVERSION N/A 10/12/2017   Procedure: CARDIOVERSION;  Surgeon: Antonieta Iba, MD;  Location: [facility] ORS;  Service: Cardiovascular;  Laterality: N/A;   CARDIOVERSION N/A 07/04/2021   Procedure: CARDIOVERSION;  Surgeon: Laurey Morale, MD;  Location: [facility] ENDOSCOPY;  Service: Cardiovascular;  Laterality: N/A;   CARDIOVERSION N/A 03/27/2023   Procedure: CARDIOVERSION;  Surgeon: Laurey Morale, MD;  Location: [facility] ORS;  Service: Cardiovascular;  Laterality: N/A;   CATARACT EXTRACTION W/PHACO Right 09/19/2020   Procedure: CATARACT EXTRACTION PHACO AND INTRAOCULAR LENS PLACEMENT (IOC) RIGHT DIABETIC 3.95 00:43.0 9.2%;  Surgeon: Lockie Mola, MD;  Location: [facility] SURGERY CNTR;  Service: Ophthalmology;  Laterality: Right;  Pt requests to be last sleep apnea   CATARACT EXTRACTION W/PHACO Left 10/03/2020   Procedure: CATARACT EXTRACTION PHACO AND INTRAOCULAR LENS PLACEMENT (IOC) LEFT DIABETIC;  Surgeon: Lockie Mola, MD;  Location: [facility] SURGERY CNTR;  Service: Ophthalmology;  Laterality: Left;  3.25 0:59.3 5.5%   CHOLECYSTECTOMY     COLONOSCOPY WITH PROPOFOL N/A 09/10/2018   Procedure: COLONOSCOPY WITH  PROPOFOL;  Surgeon: Scot Jun, MD;  Location: [facility] ENDOSCOPY;  Service: Endoscopy;  Laterality: N/A;   COLONOSCOPY WITH PROPOFOL N/A 10/22/2022   Procedure: COLONOSCOPY WITH PROPOFOL;  Surgeon: Jaynie Collins, DO;  Location: [facility] ENDOSCOPY;  Service: Gastroenterology;  Laterality: N/A;   CYST REMOVAL TRUNK  2002   BACK   DIALYSIS/PERMA CATHETER INSERTION N/A 10/06/2017   Procedure: DIALYSIS/PERMA CATHETER INSERTION;  Surgeon: Renford Dills, MD;  Location: [facility] INVASIVE CV LAB;  Service: Cardiovascular;  Laterality: N/A;   ESOPHAGOGASTRODUODENOSCOPY (EGD) WITH PROPOFOL N/A 10/21/2022   Procedure: ESOPHAGOGASTRODUODENOSCOPY (EGD) WITH PROPOFOL;  Surgeon: Jaynie Collins, DO;  Location: [facility] ENDOSCOPY;  Service: Gastroenterology;  Laterality: N/A;   GIVENS CAPSULE STUDY N/A 10/22/2022   Procedure: GIVENS CAPSULE STUDY;  Surgeon: Jaynie Collins, DO;  Location: [facility] ENDOSCOPY;  Service: Gastroenterology;  Laterality: N/A;  IF NOTHING IS FOUND WITH COLONOSCOPY   INSERT / REPLACE / REMOVE PACEMAKER     lipoma removal  2014   back   PACEMAKER INSERTION  2015   [employer] dual chamber pacemaker implanted by Dr Graciela Husbands for symptomatic bradycardia   PERMANENT PACEMAKER INSERTION N/A 01/09/2014   Procedure: PERMANENT PACEMAKER INSERTION;  Surgeon: [facility] Salvia, MD;  Location: [facility] CATH LAB;  Service: Cardiovascular;  Laterality: N/A;   PPM GENERATOR CHANGEOUT N/A 04/01/2023   Procedure: PPM GENERATOR CHANGEOUT;  Surgeon: [facility] Salvia, MD;  Location: [facility] INVASIVE CV LAB;  Service: Cardiovascular;  Laterality: N/A;   RIGHT HEART CATH N/A 04/19/2018   Procedure: RIGHT HEART CATH;  Surgeon: Laurey Morale, MD;  Location: [facility] INVASIVE CV LAB;  Service: Cardiovascular;  Laterality: N/A;   RIGHT HEART CATH N/A 08/30/2020   Procedure: RIGHT HEART CATH;  Surgeon: Laurey Morale, MD;  Location: [facility] INVASIVE CV LAB;  Service: Cardiovascular;  Laterality: N/A;   RIGHT HEART CATH N/A  02/11/2022   Procedure: RIGHT HEART CATH;  Surgeon: Laurey Morale, MD;  Location: [facility] INVASIVE CV LAB;  Service: Cardiovascular;  Laterality: N/A;   RIGHT/LEFT HEART CATH AND CORONARY ANGIOGRAPHY N/A 10/19/2017   Procedure: RIGHT/LEFT HEART CATH AND CORONARY ANGIOGRAPHY;  Surgeon: Iran Ouch, MD;  Location: [facility] INVASIVE CV LAB;  Service: Cardiovascular;  Laterality: N/A;   TEE WITHOUT CARDIOVERSION N/A 08/20/2017   Procedure: TRANSESOPHAGEAL ECHOCARDIOGRAM (TEE) & Direct current cardioversion;  Surgeon: Antonieta Iba, MD;  Location: [facility] ORS;  Service: Cardiovascular;  Laterality: N/A;   TEE WITHOUT CARDIOVERSION N/A 06/28/2021   Procedure: TRANSESOPHAGEAL ECHOCARDIOGRAM (TEE);  Surgeon: Antonieta Iba, MD;  Location: [facility] ORS;  Service: Cardiovascular;  Laterality: N/A;   TEE WITHOUT CARDIOVERSION N/A 07/04/2021   Procedure: TRANSESOPHAGEAL ECHOCARDIOGRAM (TEE);  Surgeon: Laurey Morale, MD;  Location: [facility] ENDOSCOPY;  Service: Cardiovascular;  Laterality: N/A;   TRIGGER FINGER RELEASE      SOCIAL HISTORY:   Social History   Tobacco Use   Smoking status: Former    Current packs/day: 0.00    Average packs/day: 0.3 packs/day for 2.0 years (0.6 ttl pk-yrs)    Types: Cigarettes    Start date: 06/23/1988    Quit date: 06/23/1990    Years since quitting: 33.0    Passive exposure: Past   Smokeless tobacco: Never  Substance Use Topics   Alcohol use: No    FAMILY HISTORY:   Family History  Problem Relation Age of Onset   Heart disease Mother    Hypertension Mother    COPD Mother        was a smoker   Thyroid disease Mother    Hypertension Father    Hypertension Sister    Thyroid disease Sister    Breast cancer Sister    Hypertension Sister    Thyroid disease Sister    Bone cancer Sister    Stroke Son    Breast cancer Maternal Aunt    Kidney disease Neg Hx    Bladder Cancer Neg Hx     DRUG ALLERGIES:   Allergies  Allergen Reactions   Aspirin Anaphylaxis and  Hives    Pt states that she is unable to take because she had bleeding in her brain   Other Other (See Comments)    Pt states that she is unable to take blood thinners because she had bleeding in her brain; Blood Thinners-per doctor at [facility] (Xarelto)    REVIEW OF SYSTEMS:   ROS As per history of present illness. All pertinent systems were reviewed above. Constitutional, HEENT, cardiovascular, respiratory, GI, GU, musculoskeletal, neuro, psychiatric, endocrine, integumentary and hematologic systems were reviewed and are otherwise negative/unremarkable except for positive findings mentioned above in the HPI.   MEDICATIONS AT HOME:   Prior to Admission medications   Medication Sig Start Date End Date Taking? Authorizing Provider  acetaminophen (TYLENOL) 325 MG tablet Take 325-650 mg by mouth every 6 (six) hours as needed for mild pain.    [provider]  albuterol (ACCUNEB) 0.63 MG/3ML nebulizer solution Take 1 ampule by nebulization every 6 (six) hours as needed for wheezing.    [provider]  apixaban (ELIQUIS) 5 MG TABS tablet Take 1 tablet (5 mg total) by mouth 2 (two) times daily. RESUME SUNDAY pm 04/01/23   [facility] Salvia, MD  atropine 1 % ophthalmic solution Place 1 drop into both eyes at bedtime. 10/09/22   [provider]  cefadroxil (DURICEF) 500 MG capsule Take 1 capsule (500 mg total) by mouth 2 (two) times daily. 03/12/23   Lynn Ito, MD  cholestyramine Lanetta Inch) 4 g packet Take 1 packet by mouth daily.    [provider]  dapagliflozin propanediol (FARXIGA) 10 MG TABS tablet Take 1 tablet (10 mg total) by mouth daily before breakfast. 12/29/22   Laurey Morale, MD  gabapentin (NEURONTIN) 600 MG tablet Take 600 mg by mouth at bedtime.    [provider]  lidocaine (LIDODERM) 5 % Place 2 patches onto the skin daily. Remove & Discard patch within 12 hours or as directed by MD 10/26/22   Swayze, Ava, DO  macitentan  (OPSUMIT) 10 MG tablet Take 1 tablet (10 mg total) by mouth daily. 06/23/23   Laurey Morale, MD  meclizine (ANTIVERT) 25 MG tablet Take 1 tablet (25 mg total) by mouth 3 (three) times daily as needed for dizziness. 01/15/22   Laurey Morale, MD  Menthol, Topical Analgesic, (BIOFREEZE EX) Apply 1 application topically 4 (four) times daily as needed (for pain).    [provider]  midodrine (PROAMATINE) 5 MG tablet Take one tablet at 12 noon, 4pm and 8pm 07/15/22   [facility] Salvia, MD  oxyCODONE (OXY IR/ROXICODONE) 5 MG immediate release tablet Take 1 tablet (5 mg total) by mouth every 4 (four) hours as needed for moderate pain, breakthrough pain or severe pain. 10/26/22   Swayze, Ava, DO  potassium chloride SA (KLOR-CON M) 20 MEQ tablet Take every morning and 20mg  every evening. 03/23/23   Laurey Morale, MD  rosuvastatin (CRESTOR) 10 MG tablet Take 1 tablet by mouth once daily 06/29/23   Laurey Morale, MD  Selexipag (UPTRAVI) 1600 MCG TABS Take 1 tablet (1,600 mcg total) by mouth 2 (two) times daily. 07/30/21   Laurey Morale, MD  sildenafil (REVATIO) 20 MG tablet Take 1 tablet (20 mg total) by mouth 3 (three) times daily. 10/14/22   Laurey Morale, MD  spironolactone (ALDACTONE) 25 MG tablet Take 1 tablet by mouth daily. 07/16/23   [provider]  torsemide (DEMADEX) 20 MG tablet Take 40 mg by mouth daily.    [provider]      VITAL SIGNS:  Blood pressure (!) 133/101, pulse 73, temperature 98 F (36.7 C), temperature source Oral, resp. rate (!) 22, height 5' (1.524 m), weight 89 kg, SpO2 100%.  PHYSICAL EXAMINATION:  Physical Exam  GENERAL:  78 y.o.-year-old African-American female patient lying in the bed with mild respiratory distress with conversational dyspnea. EYES: Pupils equal, round, reactive to light and accommodation. No scleral icterus. Extraocular muscles intact.  HEENT: Head atraumatic, normocephalic. Oropharynx and nasopharynx clear.   NECK:  Supple, no jugular venous distention. No thyroid enlargement, no tenderness.  LUNGS: Diminished bibasilar breath sounds with bibasal crackles.. No use of accessory muscles of respiration.  CARDIOVASCULAR: Regular rate and rhythm, S1, S2 normal. No murmurs, rubs, or gallops.  ABDOMEN: Soft, nondistended, nontender. Bowel sounds present. No organomegaly or mass.  EXTREMITIES: 1+ bilateral extremity pitting edema with no cyanosis, or clubbing.  NEUROLOGIC: Cranial nerves II through XII are intact. Muscle strength 5/5 in all extremities. Sensation intact. Gait not checked.  PSYCHIATRIC: The patient is alert and oriented x 3.  Normal affect and good eye contact. SKIN: No obvious rash, lesion, or ulcer.   LABORATORY PANEL:   CBC Recent Labs  Lab 07/17/23 1536  WBC 3.9*  HGB 10.3*  HCT 34.8*  PLT 174   ------------------------------------------------------------------------------------------------------------------  Chemistries  Recent Labs  Lab 07/17/23 1536  NA 141  K 2.5*  CL 97*  CO2 31  GLUCOSE 97  BUN 15  CREATININE 0.92  CALCIUM 8.8*   ------------------------------------------------------------------------------------------------------------------  Cardiac Enzymes No results for input(s): "TROPONINI" in the last 168 hours. ------------------------------------------------------------------------------------------------------------------  RADIOLOGY:  CUP PACEART INCLINIC DEVICE CHECK Result Date: 07/17/2023 Normal in-clinic Pacemaker check. Thresholds, sensing, and impedance WNL or stable for patient over time. Persis AFib since gen change with elevated Ventricular rates. Estimated longevity __13.3 years__. Pt enrolled in remote follow-up. Percell Belt, NP  DG Chest 2 View Result Date: 07/17/2023 CLINICAL DATA:  Shortness of breath.  Chest pain. EXAM: CHEST - 2 VIEW COMPARISON:  10/24/2022. FINDINGS: Low lung volume. Redemonstration of increased interstitial  markings throughout bilateral lungs, which corresponds to chronic interstitial lung disease on the prior CT scan. No acute consolidation or lung collapse. Bilateral costophrenic angles are clear. Stable moderately enlarged cardio-mediastinal silhouette. There is a left sided 2-lead pacemaker. No acute osseous abnormalities. The soft tissues are within normal limits. IMPRESSION: *No active cardiopulmonary disease. Chronic interstitial lung disease. Electronically Signed   By: Jules Schick M.D.   On: 07/17/2023 16:23      IMPRESSION AND PLAN:  Assessment and Plan: * Acute on chronic diastolic (congestive) heart failure (HCC)  -The patient will be admitted to a cardiac telemetry bed. - We will continue diuresis with IV Lasix. - We will continue Aldactone and Farxiga. - We Will follow serial troponins. - We will follow I's and O's and daily weights. - Cardiology consult be obtained. - I notified [facility] group about the patient.   Hypokalemia - We will replace potassium and check magnesium level.  Interstitial lung disease (HCC) - She may be having mild exacerbation. - Her elevated BNP though is more indicative of acute CHF. - We will continue bronchodilator therapy.  Dyslipidemia - We will  continue statin therapy.  Chronic atrial fibrillation with RVR (HCC) - We will continue Eliquis.    DVT prophylaxis: Eliquis Advanced Care Planning:  Code Status: full code.  Family Communication:  The plan of care was discussed in details with the patient (and family). I answered all questions. The patient agreed to proceed with the above mentioned plan. Further management will depend upon hospital course. Disposition Plan: Back to previous home environment Consults called: Cardiology All the records are reviewed and case discussed with ED provider.  Status is: Observatio  I certify that at the time of admission, it is my clinical judgment that the patient will require  hospital care  extending less than 2 midnights.                            Dispo: The patient is from: Home              Anticipated d/c is to: Home              Patient currently is not medically stable to d/c.              Difficult to place patient: No  Hannah Beat M.D on 07/18/2023 at 3:44 AM  [facility]   From 7 PM-7 AM, contact night-coverage [email or web address]  CC: Primary care physician; Glori Luis, MD

## 2023-07-18 DIAGNOSIS — I482 Chronic atrial fibrillation, unspecified: Secondary | ICD-10-CM | POA: Diagnosis not present

## 2023-07-18 DIAGNOSIS — I272 Pulmonary hypertension, unspecified: Secondary | ICD-10-CM | POA: Diagnosis not present

## 2023-07-18 DIAGNOSIS — Z825 Family history of asthma and other chronic lower respiratory diseases: Secondary | ICD-10-CM | POA: Diagnosis not present

## 2023-07-18 DIAGNOSIS — I5033 Acute on chronic diastolic (congestive) heart failure: Secondary | ICD-10-CM | POA: Diagnosis present

## 2023-07-18 DIAGNOSIS — E876 Hypokalemia: Secondary | ICD-10-CM | POA: Diagnosis present

## 2023-07-18 DIAGNOSIS — Z7901 Long term (current) use of anticoagulants: Secondary | ICD-10-CM | POA: Diagnosis not present

## 2023-07-18 DIAGNOSIS — Z950 Presence of cardiac pacemaker: Secondary | ICD-10-CM | POA: Diagnosis not present

## 2023-07-18 DIAGNOSIS — R079 Chest pain, unspecified: Secondary | ICD-10-CM

## 2023-07-18 DIAGNOSIS — Z993 Dependence on wheelchair: Secondary | ICD-10-CM | POA: Diagnosis not present

## 2023-07-18 DIAGNOSIS — J849 Interstitial pulmonary disease, unspecified: Secondary | ICD-10-CM | POA: Diagnosis present

## 2023-07-18 DIAGNOSIS — E785 Hyperlipidemia, unspecified: Secondary | ICD-10-CM | POA: Diagnosis present

## 2023-07-18 DIAGNOSIS — G4733 Obstructive sleep apnea (adult) (pediatric): Secondary | ICD-10-CM | POA: Diagnosis present

## 2023-07-18 DIAGNOSIS — I214 Non-ST elevation (NSTEMI) myocardial infarction: Secondary | ICD-10-CM | POA: Diagnosis not present

## 2023-07-18 DIAGNOSIS — I4819 Other persistent atrial fibrillation: Secondary | ICD-10-CM | POA: Diagnosis present

## 2023-07-18 DIAGNOSIS — I951 Orthostatic hypotension: Secondary | ICD-10-CM | POA: Diagnosis present

## 2023-07-18 DIAGNOSIS — I2721 Secondary pulmonary arterial hypertension: Secondary | ICD-10-CM | POA: Diagnosis present

## 2023-07-18 DIAGNOSIS — Z87891 Personal history of nicotine dependence: Secondary | ICD-10-CM | POA: Diagnosis not present

## 2023-07-18 DIAGNOSIS — J9611 Chronic respiratory failure with hypoxia: Secondary | ICD-10-CM | POA: Diagnosis present

## 2023-07-18 DIAGNOSIS — I428 Other cardiomyopathies: Secondary | ICD-10-CM | POA: Diagnosis present

## 2023-07-18 DIAGNOSIS — E669 Obesity, unspecified: Secondary | ICD-10-CM | POA: Diagnosis present

## 2023-07-18 DIAGNOSIS — I2510 Atherosclerotic heart disease of native coronary artery without angina pectoris: Secondary | ICD-10-CM | POA: Diagnosis present

## 2023-07-18 DIAGNOSIS — E1122 Type 2 diabetes mellitus with diabetic chronic kidney disease: Secondary | ICD-10-CM | POA: Diagnosis present

## 2023-07-18 DIAGNOSIS — Z79899 Other long term (current) drug therapy: Secondary | ICD-10-CM | POA: Diagnosis not present

## 2023-07-18 DIAGNOSIS — I495 Sick sinus syndrome: Secondary | ICD-10-CM | POA: Diagnosis present

## 2023-07-18 DIAGNOSIS — Z886 Allergy status to analgesic agent status: Secondary | ICD-10-CM | POA: Diagnosis not present

## 2023-07-18 DIAGNOSIS — Z8249 Family history of ischemic heart disease and other diseases of the circulatory system: Secondary | ICD-10-CM | POA: Diagnosis not present

## 2023-07-18 DIAGNOSIS — I110 Hypertensive heart disease with heart failure: Secondary | ICD-10-CM | POA: Diagnosis present

## 2023-07-18 DIAGNOSIS — Z8616 Personal history of COVID-19: Secondary | ICD-10-CM | POA: Diagnosis not present

## 2023-07-18 HISTORY — DX: Non-ST elevation (NSTEMI) myocardial infarction: I21.4

## 2023-07-18 HISTORY — DX: Hypokalemia: E87.6

## 2023-07-18 LAB — CBC
HCT: 33.5 % — ABNORMAL LOW (ref 36.0–46.0)
Hemoglobin: 9.8 g/dL — ABNORMAL LOW (ref 12.0–15.0)
MCH: 25.9 pg — ABNORMAL LOW (ref 26.0–34.0)
MCHC: 29.3 g/dL — ABNORMAL LOW (ref 30.0–36.0)
MCV: 88.6 fL (ref 80.0–100.0)
Platelets: 176 10*3/uL (ref 150–400)
RBC: 3.78 MIL/uL — ABNORMAL LOW (ref 3.87–5.11)
RDW: 14.1 % (ref 11.5–15.5)
WBC: 4.4 10*3/uL (ref 4.0–10.5)
nRBC: 0 % (ref 0.0–0.2)

## 2023-07-18 LAB — HEMOGLOBIN A1C
Hgb A1c MFr Bld: 5.1 % (ref 4.8–5.6)
Mean Plasma Glucose: 99.67 mg/dL

## 2023-07-18 LAB — MAGNESIUM: Magnesium: 2.1 mg/dL (ref 1.7–2.4)

## 2023-07-18 LAB — BASIC METABOLIC PANEL
Anion gap: 15 (ref 5–15)
BUN: 12 mg/dL (ref 8–23)
CO2: 29 mmol/L (ref 22–32)
Calcium: 8.9 mg/dL (ref 8.9–10.3)
Chloride: 102 mmol/L (ref 98–111)
Creatinine, Ser: 0.78 mg/dL (ref 0.44–1.00)
GFR, Estimated: >60 mL/min (ref >60)
Glucose, Bld: 112 mg/dL — ABNORMAL HIGH (ref 70–99)
Potassium: 2.7 mmol/L — CL (ref 3.5–5.1)
Sodium: 146 mmol/L — ABNORMAL HIGH (ref 135–145)

## 2023-07-18 LAB — CBG MONITORING, ED
Glucose-Capillary: 98 mg/dL (ref 70–99)
Glucose-Capillary: 138 mg/dL — ABNORMAL HIGH (ref 70–99)
Glucose-Capillary: 110 mg/dL — ABNORMAL HIGH (ref 70–99)

## 2023-07-18 LAB — TROPONIN I (HIGH SENSITIVITY)
Troponin I (High Sensitivity): 29 ng/L — ABNORMAL HIGH (ref <18)
Troponin I (High Sensitivity): 32 ng/L — ABNORMAL HIGH (ref <18)

## 2023-07-18 LAB — HEPARIN LEVEL (UNFRACTIONATED): Heparin Unfractionated: >1.1 [IU]/mL — ABNORMAL HIGH (ref 0.30–0.70)

## 2023-07-18 LAB — PROTIME-INR
INR: 1.3 — ABNORMAL HIGH (ref 0.8–1.2)
Prothrombin Time: 16.1 s — ABNORMAL HIGH (ref 11.4–15.2)

## 2023-07-18 LAB — APTT: aPTT: 31 s (ref 24–36)

## 2023-07-18 MED ORDER — POTASSIUM CHLORIDE 10 MEQ/100ML IV SOLN
10.0000 meq | INTRAVENOUS | Status: AC
  Administered 2023-07-18 (×2): 10 meq via INTRAVENOUS
  Filled 2023-07-18 (×2): qty 100

## 2023-07-18 MED ORDER — LOPERAMIDE HCL 2 MG PO CAPS
2.0000 mg | ORAL_CAPSULE | ORAL | Status: DC | PRN
  Administered 2023-07-18 – 2023-07-21 (×2): 2 mg via ORAL
  Filled 2023-07-18 (×2): qty 1

## 2023-07-18 MED ORDER — POTASSIUM CHLORIDE CRYS ER 20 MEQ PO TBCR
20.0000 meq | EXTENDED_RELEASE_TABLET | Freq: Every evening | ORAL | Status: DC
  Administered 2023-07-18 – 2023-07-20 (×3): 20 meq via ORAL
  Filled 2023-07-18 (×3): qty 1

## 2023-07-18 MED ORDER — INSULIN ASPART 100 UNIT/ML IJ SOLN
0.0000 [IU] | Freq: Three times a day (TID) | INTRAMUSCULAR | Status: DC
  Administered 2023-07-18 – 2023-07-20 (×2): 1 [IU] via SUBCUTANEOUS
  Filled 2023-07-18 (×2): qty 1

## 2023-07-18 MED ORDER — POTASSIUM CHLORIDE CRYS ER 20 MEQ PO TBCR
40.0000 meq | EXTENDED_RELEASE_TABLET | Freq: Every day | ORAL | Status: DC
  Administered 2023-07-18 – 2023-07-21 (×4): 40 meq via ORAL
  Filled 2023-07-18 (×4): qty 2

## 2023-07-18 MED ORDER — HEPARIN (PORCINE) 25000 UT/250ML-% IV SOLN
1150.0000 [IU]/h | INTRAVENOUS | Status: DC
  Administered 2023-07-18: 850 [IU]/h via INTRAVENOUS
  Administered 2023-07-19: 1200 [IU]/h via INTRAVENOUS
  Filled 2023-07-18 (×2): qty 250

## 2023-07-18 NOTE — Progress Notes (Signed)
[facility]  - [place] at [facility]   PATIENT NAME: Debra Saunders    MR#:  [id number]  DATE OF BIRTH:  October 08, 1945  SUBJECTIVE:   Came in with increasing shortness of breath. Was seen by EP nurse practitioner Friday. Started on IV Lasix. Good urine output. Overall feeling better. Denies chest pain today   VITALS:  Blood pressure (!) 150/79, pulse 60, temperature 98.6 F (37 C), temperature source Oral, resp. rate 20, height 5' (1.524 m), weight 89 kg, SpO2 100%.  PHYSICAL EXAMINATION:   GENERAL:  78 y.o.-year-old patient with no acute distress.  LUNGS: decreased breath sounds bilaterally, no wheezing CARDIOVASCULAR: S1, S2 normal. No murmur   ABDOMEN: Soft, nontender, nondistended. Bowel sounds present.  EXTREMITIES: + edema b/l.    NEUROLOGIC: nonfocal  patient is alert and awake SKIN: No obvious rash, lesion, or ulcer.   LABORATORY PANEL:  CBC Recent Labs  Lab 07/18/23 0630  WBC 4.4  HGB 9.8*  HCT 33.5*  PLT 176    Chemistries  Recent Labs  Lab 07/18/23 0630  NA 146*  K 2.7*  CL 102  CO2 29  GLUCOSE 112*  BUN 12  CREATININE 0.78  CALCIUM 8.9  MG 2.1   Cardiac Enzymes No results for input(s): "TROPONINI" in the last 168 hours. RADIOLOGY:  CUP PACEART INCLINIC DEVICE CHECK Result Date: 07/17/2023 Normal in-clinic Pacemaker check. Thresholds, sensing, and impedance WNL or stable for patient over time. Persis AFib since gen change with elevated Ventricular rates. Estimated longevity __13.3 years__. Pt enrolled in remote follow-up. Percell Belt, NP  DG Chest 2 View Result Date: 07/17/2023 CLINICAL DATA:  Shortness of breath.  Chest pain. EXAM: CHEST - 2 VIEW COMPARISON:  10/24/2022. FINDINGS: Low lung volume. Redemonstration of increased interstitial markings throughout bilateral lungs, which corresponds to chronic interstitial lung disease on the prior CT scan. No acute consolidation or lung collapse. Bilateral costophrenic angles are clear.  Stable moderately enlarged cardio-mediastinal silhouette. There is a left sided 2-lead pacemaker. No acute osseous abnormalities. The soft tissues are within normal limits. IMPRESSION: *No active cardiopulmonary disease. Chronic interstitial lung disease. Electronically Signed   By: Jules Schick M.D.   On: 07/17/2023 16:23    Assessment and Plan  Debra Saunders is a 78 y.o. 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  Patient's in device was interrogated in the clinic she was found to be in a fib RVR a few times. The recommended she be admitted to the hospital for evaluation consider cardioversion.  High-sensitivity troponin 24-- 25-- 29. BNP was 335. Patient was started on IV Lasix and has put out good amount of urine. She is overall feeling a lot better. She recently had battery change in her pacemaker. She takes eliquis.  Congestive heart failure acute on chronic diastolic -- came in with chest tightness and shortness of breath with elevated BNP 335 -- no acute chest x-ray findings but clinically feels short of breath --\ Currently on Lasix IV 40 mg BID. Monitor creatinine and sodium and adjust Lasix accordingly -- echo pending --cont farxiga, spironolactone  Chest pain with mildly elevated troponin --  per cardiology plan for cath on Monday -- hold eliquis and started heparin by Dr. Duke Salvia  Persistent a fib -- patient's device was interrogated and noted to be in a fib few times. -- Plan for DC CV after cath per [facility]  cardiology  Pulmonary hypertension obstructive sleep apnea --cont selexipag and sildenafil -- CPAP nightly  Chronic endocarditis with lead vegetation -- not a candidate for pacemaker extraction -- continue chronic PO prophylactic antibiotic  History of orthostatic hypotension --midodrine    Procedures: Family communication :none Consults : [facility] cardiology CODE STATUS: full DVT Prophylaxis : heparin drip Level of care: Telemetry Medical Status is: Inpatient Remains inpatient appropriate because: chf, afib, needs cath and DCCV on monday    TOTAL TIME TAKING CARE OF THIS PATIENT: 35 minutes.  >50% time spent on counselling and coordination of care  Note: This dictation was prepared with Dragon dictation along with smaller phrase technology. Any transcriptional errors that result from this process are unintentional.  Enedina Finner M.D    [facility]   CC: Primary care physician; Glori Luis, MD

## 2023-07-18 NOTE — ED Notes (Signed)
RT was called to apply small CPAP on pt for rest.

## 2023-07-18 NOTE — Assessment & Plan Note (Signed)
-  We will continue statin therapy.

## 2023-07-18 NOTE — Assessment & Plan Note (Addendum)
-  The patient will be admitted to a cardiac telemetry bed. - We will continue diuresis with IV Lasix. - We will continue Aldactone and Farxiga. - We Will follow serial troponins. - We will follow I's and O's and daily weights. - Cardiology consult be obtained. - I notified [facility] group about the patient.

## 2023-07-18 NOTE — ED Notes (Signed)
Answered call light, pt was finished using the bedside commode. Assisted with putting the pt back in the bed and putting a brief on her as well as her purewick. Bed lowered to lowest position.

## 2023-07-18 NOTE — Assessment & Plan Note (Signed)
-   She may be having mild exacerbation. - Her elevated BNP though is more indicative of acute CHF. - We will continue bronchodilator therapy.

## 2023-07-18 NOTE — Assessment & Plan Note (Signed)
-  We will continue Eliquis.

## 2023-07-18 NOTE — ED Notes (Signed)
Transfer of care to Presenter, broadcasting @ [phone]

## 2023-07-18 NOTE — Assessment & Plan Note (Signed)
>>  ASSESSMENT AND PLAN FOR DYSLIPIDEMIA WRITTEN ON 07/18/2023  3:43 AM BY MANSY, JAN A, MD  - We will  continue statin therapy.

## 2023-07-18 NOTE — Consult Note (Signed)
Cardiology Consultation   Patient ID: ABIGALE DOROW MRN: [id number]; DOB: 1945-07-15  Admit date: 07/17/2023 Date of Consult: 07/18/2023  PCP:  Glori Luis, MD   [facility] Providers Cardiologist:  [name], MD  Electrophysiologist:  Sherryl Manges, MD       Patient Profile:   Debra Saunders is a 78 y.o. female with a hx of ILD, CAD, chronic hypoxic respiratory failure on 4L home O2, chronic anemia, sinus node dysfunction status post pacemaker, endocarditis with lead vegetation not a candidate for extraction, chronic antibiotics, persistent atrial fibrillation, OSA, pulmonary hypertension, hypertension, orthostatic hypotension, CVA, chronic lymphedema, and DVT s/p IVC filter who is being seen 07/18/2023 for the evaluation of chest pain and atrial fibrillation at the request of Dr. Allena Katz.  History of Present Illness:   Ms. Reller was seen in cardiology office 07/17/2023.  She reported having chest pain for the preceding 3 days.  She did call EMS but decided not to go to the ED as she had outpatient follow-up planned.  Her device was interrogated and she was found to be in atrial fibrillation with RVR at times.  They recommended that she be admitted to the hospital for evaluation and consider cardioversion.    EKG revealed atrial fibrillation with ventricular pacing.  Ventricular rate 63 bpm.  High-sensitivity troponin was elevated to 24--> 25--> 29.  Potassium 2.5.  BNP 335.  Ms. Faust presented with shortness of breath and chest pain. The chest pain was described as sharp, radiating to the arm, and more noticeable when walking around. The patient also reported tenderness upon touching the chest. The shortness of breath had progressed to the point where the patient required a wheelchair for mobility, a significant decline from her baseline ability to walk with a rollator. The patient also reported increased nocturnal dyspnea.  She had noticed increased swelling in  her legs, particularly the right leg, over the past week. Despite an increase in torsemide in December, the patient had not increased the dose since. The patient had been prescribed Lasix in the hospital.  Ms. Talley had a history of two pacemaker placements, the most recent for a battery change in October. The patient reported bruising and tenderness around the pacemaker site.  She hasn't missed any doses of her Eliquis.    Past Medical History:  Diagnosis Date   Arthritis    Atrial fibrillation, persistent (HCC)    a. s/p TEE-guided DCCV on 08/20/2017; b. 06/2021 Recurrent Afib-->amio/TEE/DCCV.   CAD (coronary artery disease)    a. 01/2012 Cath: nonobs dzs; b. 04/2014 MV: No ischemia; c. 09/2017 Cath: Minimal nonobs RCA dzs, otw nl cors.   Cardiorenal syndrome    a. 09/2017 Creat rose to 2.7 w/ diuresis-->HD x 2.   Chronic back pain    Chronic combined systolic (congestive) and diastolic (congestive) heart failure (HCC)    a. Dx 2005 @ [facility];  b. 02/2014 Echo: EF 55-60%; c. 07/2017: EF 30-35%; d. 09/2017 Echo: EF 40-45%; e. 02/2018 Echo: EF 55-60%; f. 08/2019 Echo: EF 60-65%; g. 08/2020 RHC: RA 3, RV 30/1, PA 33/11 (20), PCWP 6; f. 08/2021 Echo: EF 60-65%, no rwma, GrIDD, nl RV fxn, RVSP 40.28mmHg, mod dil LA, mod TR.   Chronic respiratory failure (HCC)    a. on home O2   Gastritis    History of COVID-19    History of endocarditis 08/22/2021   History of intracranial hemorrhage    a. 6/14 described by neurosurgery as "small frontal  posttraumatic SAH " - pt was on xarelto @ the time.   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 Chronic LLE edema.   NICM (nonischemic cardiomyopathy) (HCC)    a. 09/2017 Echo: EF 40-45%; b. 09/2017 Cath: minor, insignificant CAD; c. 02/2018 Echo: EF 55-60%, Gr1 DD; d. 08/2019 Echo: EF 60-65%, Gr2 DD.   Obesity    PAH (pulmonary artery hypertension)  (HCC)    a. 09/2017 Echo: PASP ; b. 09/2017 RHC: RA 23, RV 84/13, PA 84/29, PCWP 20, CO 3.89, CI 1.89. LVEDP 18; c. 08/2020 RHC: RA 3, RV 30/1, PA 33/11 (20), PCWP 6. CO/CI 6.18/3.36. PVR 2.3 WU.   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 06/2021. Veg on PPM lead. Not candidate for extraction-->s/p 6 wks abx.   Sepsis with metabolic encephalopathy (HCC)    Sleep apnea    Streptococcal bacteremia    Stroke [facility]) 2014   Subarachnoid hemorrhage (HCC) 01/19/2013   Symptomatic bradycardia    a. s/p BSX PPM.   Thyroid nodule    Type II diabetes mellitus (HCC)    Urinary incontinence     Past Surgical History:  Procedure Laterality Date   ABDOMINAL HYSTERECTOMY  1969   BREAST BIOPSY Right 10/28/2019   lymph node bx, [place] Bx, pending path    CARDIAC CATHETERIZATION  2013   @ [facility]: No obstructive CAD: Only 20% ostial left circumflex.   CARDIOVERSION N/A 10/12/2017   Procedure: CARDIOVERSION;  Surgeon: Antonieta Iba, MD;  Location: [facility] ORS;  Service: Cardiovascular;  Laterality: N/A;   CARDIOVERSION N/A 07/04/2021   Procedure: CARDIOVERSION;  Surgeon: Laurey Morale, MD;  Location: [facility] ENDOSCOPY;  Service: Cardiovascular;  Laterality: N/A;   CARDIOVERSION N/A 03/27/2023   Procedure: CARDIOVERSION;  Surgeon: Laurey Morale, MD;  Location: [facility] ORS;  Service: Cardiovascular;  Laterality: N/A;   CATARACT EXTRACTION W/PHACO Right 09/19/2020   Procedure: CATARACT EXTRACTION PHACO AND INTRAOCULAR LENS PLACEMENT (IOC) RIGHT DIABETIC 3.95 00:43.0 9.2%;  Surgeon: Lockie Mola, MD;  Location: [facility];  Service: Ophthalmology;  Laterality: Right;  Pt requests to be last sleep apnea   CATARACT EXTRACTION W/PHACO Left 10/03/2020   Procedure: CATARACT EXTRACTION PHACO AND INTRAOCULAR LENS PLACEMENT (IOC) LEFT DIABETIC;  Surgeon: Lockie Mola, MD;  Location: [facility] SURGERY CNTR;  Service: Ophthalmology;  Laterality: Left;   3.25 0:59.3 5.5%   CHOLECYSTECTOMY     COLONOSCOPY WITH PROPOFOL N/A 09/10/2018   Procedure: COLONOSCOPY WITH PROPOFOL;  Surgeon: Scot Jun, MD;  Location: [facility] ENDOSCOPY;  Service: Endoscopy;  Laterality: N/A;   COLONOSCOPY WITH PROPOFOL N/A 10/22/2022   Procedure: COLONOSCOPY WITH PROPOFOL;  Surgeon: Jaynie Collins, DO;  Location: [facility] ENDOSCOPY;  Service: Gastroenterology;  Laterality: N/A;   CYST REMOVAL TRUNK  2002   BACK   DIALYSIS/PERMA CATHETER INSERTION N/A 10/06/2017   Procedure: DIALYSIS/PERMA CATHETER INSERTION;  Surgeon: Renford Dills, MD;  Location: [facility] INVASIVE CV LAB;  Service: Cardiovascular;  Laterality: N/A;   ESOPHAGOGASTRODUODENOSCOPY (EGD) WITH PROPOFOL N/A 10/21/2022   Procedure: ESOPHAGOGASTRODUODENOSCOPY (EGD) WITH PROPOFOL;  Surgeon: Jaynie Collins, DO;  Location: [facility] ENDOSCOPY;  Service: Gastroenterology;  Laterality: N/A;   GIVENS CAPSULE STUDY N/A 10/22/2022   Procedure: GIVENS CAPSULE STUDY;  Surgeon: Jaynie Collins, DO;  Location: [facility] ENDOSCOPY;  Service: Gastroenterology;  Laterality: N/A;  IF NOTHING IS FOUND WITH COLONOSCOPY   INSERT / REPLACE / REMOVE PACEMAKER     lipoma removal  2014   back   PACEMAKER INSERTION  2015   [employer] dual chamber pacemaker implanted by Dr Graciela Husbands for symptomatic bradycardia   PERMANENT PACEMAKER INSERTION N/A 01/09/2014   Procedure: PERMANENT PACEMAKER INSERTION;  Surgeon: [facility] Salvia, MD;  Location: [facility] CATH LAB;  Service: Cardiovascular;  Laterality: N/A;   PPM GENERATOR CHANGEOUT N/A 04/01/2023   Procedure: PPM GENERATOR CHANGEOUT;  Surgeon: [facility] Salvia, MD;  Location: [facility] INVASIVE CV LAB;  Service: Cardiovascular;  Laterality: N/A;   RIGHT HEART CATH N/A 04/19/2018   Procedure: RIGHT HEART CATH;  Surgeon: Laurey Morale, MD;  Location: [facility] INVASIVE CV LAB;  Service: Cardiovascular;  Laterality: N/A;   RIGHT HEART CATH N/A 08/30/2020   Procedure: RIGHT HEART CATH;  Surgeon:  Laurey Morale, MD;  Location: [facility] INVASIVE CV LAB;  Service: Cardiovascular;  Laterality: N/A;   RIGHT HEART CATH N/A 02/11/2022   Procedure: RIGHT HEART CATH;  Surgeon: Laurey Morale, MD;  Location: [facility] INVASIVE CV LAB;  Service: Cardiovascular;  Laterality: N/A;   RIGHT/LEFT HEART CATH AND CORONARY ANGIOGRAPHY N/A 10/19/2017   Procedure: RIGHT/LEFT HEART CATH AND CORONARY ANGIOGRAPHY;  Surgeon: Iran Ouch, MD;  Location: [facility] INVASIVE CV LAB;  Service: Cardiovascular;  Laterality: N/A;   TEE WITHOUT CARDIOVERSION N/A 08/20/2017   Procedure: TRANSESOPHAGEAL ECHOCARDIOGRAM (TEE) & Direct current cardioversion;  Surgeon: Antonieta Iba, MD;  Location: [facility] ORS;  Service: Cardiovascular;  Laterality: N/A;   TEE WITHOUT CARDIOVERSION N/A 06/28/2021   Procedure: TRANSESOPHAGEAL ECHOCARDIOGRAM (TEE);  Surgeon: Antonieta Iba, MD;  Location: [facility] ORS;  Service: Cardiovascular;  Laterality: N/A;   TEE WITHOUT CARDIOVERSION N/A 07/04/2021   Procedure: TRANSESOPHAGEAL ECHOCARDIOGRAM (TEE);  Surgeon: Laurey Morale, MD;  Location: [facility] ENDOSCOPY;  Service: Cardiovascular;  Laterality: N/A;   TRIGGER FINGER RELEASE       Home Medications:  Prior to Admission medications   Medication Sig Start Date End Date Taking? Authorizing Provider  acetaminophen (TYLENOL) 325 MG tablet Take 325-650 mg by mouth every 6 (six) hours as needed for mild pain.    [provider]  albuterol (ACCUNEB) 0.63 MG/3ML nebulizer solution Take 1 ampule by nebulization every 6 (six) hours as needed for wheezing.    [provider]  apixaban (ELIQUIS) 5 MG TABS tablet Take 1 tablet (5 mg total) by mouth 2 (two) times daily. RESUME SUNDAY pm 04/01/23   [facility] Salvia, MD  atropine 1 % ophthalmic solution Place 1 drop into both eyes at bedtime. 10/09/22   [provider]  cefadroxil (DURICEF) 500 MG capsule Take 1 capsule (500 mg total) by mouth 2 (two) times daily. 03/12/23   Lynn Ito,  MD  cholestyramine Lanetta Inch) 4 g packet Take 1 packet by mouth daily.    [provider]  dapagliflozin propanediol (FARXIGA) 10 MG TABS tablet Take 1 tablet (10 mg total) by mouth daily before breakfast. 12/29/22   Laurey Morale, MD  gabapentin (NEURONTIN) 600 MG tablet Take 600 mg by mouth at bedtime.    [provider]  lidocaine (LIDODERM) 5 % Place 2 patches onto the skin daily. Remove & Discard patch within 12 hours or as directed by MD 10/26/22   Swayze, Ava, DO  macitentan (OPSUMIT) 10 MG tablet Take 1 tablet (10 mg total) by mouth daily. 06/23/23  Laurey Morale, MD  meclizine (ANTIVERT) 25 MG tablet Take 1 tablet (25 mg total) by mouth 3 (three) times daily as needed for dizziness. 01/15/22   Laurey Morale, MD  Menthol, Topical Analgesic, (BIOFREEZE EX) Apply 1 application topically 4 (four) times daily as needed (for pain).    [provider]  midodrine (PROAMATINE) 5 MG tablet Take one tablet at 12 noon, 4pm and 8pm 07/15/22   [facility] Salvia, MD  oxyCODONE (OXY IR/ROXICODONE) 5 MG immediate release tablet Take 1 tablet (5 mg total) by mouth every 4 (four) hours as needed for moderate pain, breakthrough pain or severe pain. 10/26/22   Swayze, Ava, DO  potassium chloride SA (KLOR-CON M) 20 MEQ tablet Take every morning and 20mg  every evening. 03/23/23   Laurey Morale, MD  rosuvastatin (CRESTOR) 10 MG tablet Take 1 tablet by mouth once daily 06/29/23   Laurey Morale, MD  Selexipag (UPTRAVI) 1600 MCG TABS Take 1 tablet (1,600 mcg total) by mouth 2 (two) times daily. 07/30/21   Laurey Morale, MD  sildenafil (REVATIO) 20 MG tablet Take 1 tablet (20 mg total) by mouth 3 (three) times daily. 10/14/22   Laurey Morale, MD  spironolactone (ALDACTONE) 25 MG tablet Take 1 tablet by mouth daily. 07/16/23   [provider]  torsemide (DEMADEX) 20 MG tablet Take 40 mg by mouth daily.    [provider]    Inpatient Medications: Scheduled  Meds:  apixaban  5 mg Oral BID   atropine  1 drop Both Eyes QHS   cefadroxil  500 mg Oral BID   cholestyramine  1 packet Oral Daily   dapagliflozin propanediol  10 mg Oral QAC breakfast   furosemide  40 mg Intravenous Q12H   gabapentin  600 mg Oral QHS   insulin aspart  0-9 Units Subcutaneous TID WC   midodrine  5 mg Oral TID   potassium chloride  40 mEq Oral Q breakfast   And   potassium chloride  20 mEq Oral QPM   rosuvastatin  10 mg Oral Daily   Selexipag  1,600 mcg Oral BID   sildenafil  20 mg Oral TID   spironolactone  25 mg Oral Daily   Continuous Infusions:  PRN Meds: acetaminophen **OR** acetaminophen, albuterol, loperamide, magnesium hydroxide, meclizine, ondansetron **OR** ondansetron (ZOFRAN) IV, oxyCODONE, traZODone  Allergies:    Allergies  Allergen Reactions   Aspirin Anaphylaxis and Hives    Pt states that she is unable to take because she had bleeding in her brain   Other Other (See Comments)    Pt states that she is unable to take blood thinners because she had bleeding in her brain; Blood Thinners-per doctor at [facility] (Xarelto)    Social History:   Social History   Socioeconomic History   Marital status: Divorced    Spouse name: Not on file   Number of children: 4   Years of education: 12   Highest education level: Associate degree: occupational, Scientist, product/process development, or vocational program  Occupational History   Occupation: Retired- factory work    Comment: exposed to Interior and spatial designer  Tobacco Use   Smoking status: Former    Current packs/day: 0.00    Average packs/day: 0.3 packs/day for 2.0 years (0.6 ttl pk-yrs)    Types: Cigarettes    Start date: 06/23/1988    Quit date: 06/23/1990    Years since quitting: 33.0    Passive exposure: Past   Smokeless tobacco:  Never  Vaping Use   Vaping status: Never Used  Substance and Sexual Activity   Alcohol use: No   Drug use: No   Sexual activity: Not Currently    Birth control/protection: Surgical  Other Topics  Concern   Not on file  Social History Narrative   Lives in [place] with daughter. Has 4 children. No pets.      Work - Hotel manager, retired      Diet - regular      02/14/2019: reports has Meals On Wheels delivered to her home;       Remote hx of smoking > 30 years; No alcohol-    Social Drivers of Corporate investment banker Strain: Low Risk  (03/09/2023)   Overall Financial Resource Strain (CARDIA)    Difficulty of Paying Living Expenses: Not hard at all  Food Insecurity: No Food Insecurity (03/09/2023)   Hunger Vital Sign    Worried About Running Out of Food in the Last Year: Never true    Ran Out of Food in the Last Year: Never true  Transportation Needs: No Transportation Needs (03/09/2023)   PRAPARE - Administrator, Civil Service (Medical): No    Lack of Transportation (Non-Medical): No  Recent Concern: Transportation Needs - Unmet Transportation Needs (02/26/2023)   PRAPARE - Administrator, Civil Service (Medical): Yes    Lack of Transportation (Non-Medical): No  Physical Activity: Inactive (03/09/2023)   Exercise Vital Sign    Days of Exercise per Week: 0 days    Minutes of Exercise per Session: 0 min  Stress: No Stress Concern Present (03/09/2023)   [employer] of Occupational Health - Occupational Stress Questionnaire    Feeling of Stress : Not at all  Social Connections: Socially Isolated (03/09/2023)   Social Connection and Isolation Panel [NHANES]    Frequency of Communication with Friends and Family: More than three times a week    Frequency of Social Gatherings with Friends and Family: More than three times a week    Attends Religious Services: Never    Database administrator or Organizations: No    Attends Banker Meetings: Never    Marital Status: Divorced  Catering manager Violence: Not At Risk (03/09/2023)   Humiliation, Afraid, Rape, and Kick questionnaire    Fear of Current or Ex-Partner: No    Emotionally  Abused: No    Physically Abused: No    Sexually Abused: No    Family History:    Family History  Problem Relation Age of Onset   Heart disease Mother    Hypertension Mother    COPD Mother        was a smoker   Thyroid disease Mother    Hypertension Father    Hypertension Sister    Thyroid disease Sister    Breast cancer Sister    Hypertension Sister    Thyroid disease Sister    Bone cancer Sister    Stroke Son    Breast cancer Maternal Aunt    Kidney disease Neg Hx    Bladder Cancer Neg Hx      ROS:  Please see the history of present illness.   All other ROS reviewed and negative.     Physical Exam/Data:   Vitals:   07/18/23 0900 07/18/23 1000 07/18/23 1100 07/18/23 1200  BP: (!) 162/82 (!) 178/97 (!) 157/102 (!) 150/79  Pulse: 75   60  Resp:   19 20  Temp:   98.6 F (37 C)   TempSrc:   Oral   SpO2: 97% 96% 100% 100%  Weight:      Height:        Intake/Output Summary (Last 24 hours) at 07/18/2023 1402 Last data filed at 07/18/2023 1359 Gross per 24 hour  Intake 201.51 ml  Output 2000 ml  Net -1798.49 ml      07/17/2023    3:32 PM 07/17/2023    2:28 PM 04/21/2023    9:27 AM  Last 3 Weights  Weight (lbs) 196 lb 3.4 oz 197 lb 2 oz 202 lb  Weight (kg) 89 kg 89.415 kg 91.627 kg     Body mass index is 38.32 kg/m.  General:  Well nourished, well developed, in no acute distress HEENT: normal Neck: no JVD Vascular: No carotid bruits; Distal pulses 2+ bilaterally Cardiac:  normal S1, S2; RRR; no murmur  Lungs:  Diminished breath sounds Abd: soft, nontender, no hepatomegaly  Ext: L>>>R LE edema Musculoskeletal:  No deformities, BUE and BLE strength normal and equal Skin: warm and dry  Neuro:  CNs 2-12 intact, no focal abnormalities noted Psych:  Normal affect   EKG:  The EKG was personally reviewed and demonstrates:  atrial fibrillation with ventricular pacing.  Ventricular rate 63 bpm. Telemetry:  Telemetry was personally reviewed and demonstrates:   Atrial fibrillation.  PVCs.  Rate <100 bpm.  Relevant CV Studies: RHC 02/11/22: Right Heart Pressures RHC Procedural Findings: Hemodynamics (mmHg) RA mean 3 RV 30/6 PA 29/10, mean 16 PCWP 12  Oxygen saturations: PA 70% AO 99%  Cardiac Output (Fick) 6.18  Cardiac Index (Fick) 3.37   Echo 09/12/19:   IMPRESSIONS     1. Left ventricular ejection fraction, by estimation, is 60 to 65%. The  left ventricle has normal function. The left ventricle has no regional  wall motion abnormalities. Left ventricular diastolic parameters are  consistent with Grade II diastolic  dysfunction (pseudonormalization).   2. Right ventricular systolic function is normal. The right ventricular  size is normal. There is moderately elevated pulmonary artery systolic  pressure. The estimated right ventricular systolic pressure is 49.7 mmHg.   3. Left atrial size was moderately dilated.   4. The mitral valve is normal in structure. Moderate to severe mitral  valve regurgitation. No evidence of mitral stenosis.   5. Tricuspid valve regurgitation is mild to moderate.   6. The aortic valve is normal in structure. Aortic valve regurgitation is  not visualized. Mild to moderate aortic valve sclerosis/calcification is  present, without any evidence of aortic stenosis.   7. Pulmonic valve regurgitation is moderate.   8. The inferior vena cava is normal in size with <50% respiratory  variability, suggesting right atrial pressure of 8 mmHg.   TTE, 08/23/2021  1. Left ventricular ejection fraction, by estimation, is 60 to 65%. The left ventricle has normal function. The left ventricle has no regional wall motion abnormalities. There is mild left ventricular hypertrophy. Left ventricular diastolic parameters are consistent with Grade I diastolic dysfunction (impaired relaxation).   2. Right ventricular systolic function is normal. The right ventricular size is normal. There is mildly elevated pulmonary artery  systolic pressure. The estimated right ventricular systolic pressure is 40.3 mmHg.   3. Left atrial size was moderately dilated.   4. The mitral valve is normal in structure. No evidence of mitral valve regurgitation. No evidence of mitral stenosis.   5. Tricuspid valve regurgitation is moderate.   6. The aortic  valve is normal in structure. Aortic valve regurgitation is not visualized. Aortic valve sclerosis/calcification is present, without any evidence of aortic stenosis.   7. The inferior vena cava is normal in size with greater than 50% respiratory variability, suggesting right atrial pressure of 3 mmHg.   8. RV pacer wire difficult to visualize, grossly no vegetation noted.  Laboratory Data:  High Sensitivity Troponin:   Recent Labs  Lab 07/17/23 1536 07/17/23 1734 07/18/23 0630  TROPONINIHS 24* 25* 29*     Chemistry Recent Labs  Lab 07/17/23 1536 07/18/23 0630  NA 141 146*  K 2.5* 2.7*  CL 97* 102  CO2 31 29  GLUCOSE 97 112*  BUN 15 12  CREATININE 0.92 0.78  CALCIUM 8.8* 8.9  MG  --  2.1  GFRNONAA >60 >60  ANIONGAP 13 15    No results for input(s): "PROT", "ALBUMIN", "AST", "ALT", "ALKPHOS", "BILITOT" in the last 168 hours. Lipids No results for input(s): "CHOL", "TRIG", "HDL", "LABVLDL", "LDLCALC", "CHOLHDL" in the last 168 hours.  Hematology Recent Labs  Lab 07/17/23 1536 07/18/23 0630  WBC 3.9* 4.4  RBC 3.95 3.78*  HGB 10.3* 9.8*  HCT 34.8* 33.5*  MCV 88.1 88.6  MCH 26.1 25.9*  MCHC 29.6* 29.3*  RDW 14.0 14.1  PLT 174 176   Thyroid No results for input(s): "TSH", "FREET4" in the last 168 hours.  BNP Recent Labs  Lab 07/17/23 1536  BNP 335.5*    DDimer No results for input(s): "DDIMER" in the last 168 hours.   Radiology/Studies:  CUP PACEART INCLINIC DEVICE CHECK Result Date: 07/17/2023 Normal in-clinic Pacemaker check. Thresholds, sensing, and impedance WNL or stable for patient over time. Persis AFib since gen change with elevated Ventricular  rates. Estimated longevity __13.3 years__. Pt enrolled in remote follow-up. Percell Belt, NP  DG Chest 2 View Result Date: 07/17/2023 CLINICAL DATA:  Shortness of breath.  Chest pain. EXAM: CHEST - 2 VIEW COMPARISON:  10/24/2022. FINDINGS: Low lung volume. Redemonstration of increased interstitial markings throughout bilateral lungs, which corresponds to chronic interstitial lung disease on the prior CT scan. No acute consolidation or lung collapse. Bilateral costophrenic angles are clear. Stable moderately enlarged cardio-mediastinal silhouette. There is a left sided 2-lead pacemaker. No acute osseous abnormalities. The soft tissues are within normal limits. IMPRESSION: *No active cardiopulmonary disease. Chronic interstitial lung disease. Electronically Signed   By: Jules Schick M.D.   On: 07/17/2023 16:23     Assessment and Plan:   # HFpEF:  BNP elevated to 335.  No acute findings on CXR but she is feeling better with diuresis.  Continue Farxiga.  Sodium is 146 suggesting she may be getting dry.  Reassess in AM.  Echo pending.  # Chest pain:  She has chest pain and hs-troponin is mildly elevated.  Both typical and atypical features.  She has never undergone an ischemia evaluation.  We discussed stress and cath. She would prefer cath over stress.  Will hold Eliquis and start heparin.  Plan for left heart cath and right heart cath on Monday.  # Persistent atrial fibrillation: It is likely that this is contributing to her heart feel exacerbation.  Plan for DCCV after cath.  If she spends any time outside the therapeutic range on heparin while holding Eliquis she will need a TEE first.  # Orthostatic hypotension:  Continue midodrine if SBP <120.  BP currently running high.  # PAH:  Continue selexipag and sildenafil.  Continue to use CPAP.  # Hyperlipidemia:  Continue rosuvastatin.   Risk Assessment/Risk Scores:     TIMI Risk Score for Unstable Angina or Non-ST Elevation MI:   The  patient's TIMI risk score is 4, which indicates a 20% risk of all cause mortality, new or recurrent myocardial infarction or need for urgent revascularization in the next 14 days.  New York Heart Association (NYHA) Functional Class NYHA Class III  CHA2DS2-VASc Score = 9   This indicates a 12.2% annual risk of stroke. The patient's score is based upon: CHF History: 1 HTN History: 1 Diabetes History: 1 Stroke History: 2 Vascular Disease History: 1 Age Score: 2 Gender Score: 1         For questions or updates, please contact [facility] Please consult [email or web address] for contact info under    Signed, Chilton Si, MD  07/18/2023 2:02 PM

## 2023-07-18 NOTE — Consult Note (Signed)
PHARMACY - ANTICOAGULATION CONSULT NOTE  Pharmacy Consult for Heparin Indication: chest pain/ACS  Allergies  Allergen Reactions   Aspirin Anaphylaxis and Hives    Pt states that she is unable to take because she had bleeding in her brain   Other Other (See Comments)    Pt states that she is unable to take blood thinners because she had bleeding in her brain; Blood Thinners-per doctor at [facility] (Xarelto)   Patient Measurements: Height: 5' (152.4 cm) Weight: 89 kg (196 lb 3.4 oz) IBW/kg (Calculated) : 45.5 Heparin Dosing Weight: 66.5 kg   Vital Signs: Temp: 98.6 F (37 C) (01/25 1100) Temp Source: Oral (01/25 1100) BP: 150/79 (01/25 1200) Pulse Rate: 60 (01/25 1200)  Labs: Recent Labs    07/17/23 1536 07/17/23 1734 07/18/23 0630  HGB 10.3*  --  9.8*  HCT 34.8*  --  33.5*  PLT 174  --  176  CREATININE 0.92  --  0.78  TROPONINIHS 24* 25* 29*   Estimated Creatinine Clearance: 55.7 mL/min (by C-G formula based on SCr of 0.78 mg/dL).  Medical History: Past Medical History:  Diagnosis Date   Arthritis    Atrial fibrillation, persistent (HCC)    a. s/p TEE-guided DCCV on 08/20/2017; b. 06/2021 Recurrent Afib-->amio/TEE/DCCV.   CAD (coronary artery disease)    a. 01/2012 Cath: nonobs dzs; b. 04/2014 MV: No ischemia; c. 09/2017 Cath: Minimal nonobs RCA dzs, otw nl cors.   Cardiorenal syndrome    a. 09/2017 Creat rose to 2.7 w/ diuresis-->HD x 2.   Chronic back pain    Chronic combined systolic (congestive) and diastolic (congestive) heart failure (HCC)    a. Dx 2005 @ [facility];  b. 02/2014 Echo: EF 55-60%; c. 07/2017: EF 30-35%; d. 09/2017 Echo: EF 40-45%; e. 02/2018 Echo: EF 55-60%; f. 08/2019 Echo: EF 60-65%; g. 08/2020 RHC: RA 3, RV 30/1, PA 33/11 (20), PCWP 6; f. 08/2021 Echo: EF 60-65%, no rwma, GrIDD, nl RV fxn, RVSP 40.58mmHg, mod dil LA, mod TR.   Chronic respiratory failure (HCC)    a. on home O2   Gastritis    History of COVID-19    History of endocarditis 08/22/2021    History of intracranial hemorrhage    a. 6/14 described by neurosurgery as "small frontal posttraumatic SAH " - pt was on xarelto @ the time.   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 Chronic LLE edema.   NICM (nonischemic cardiomyopathy) (HCC)    a. 09/2017 Echo: EF 40-45%; b. 09/2017 Cath: minor, insignificant CAD; c. 02/2018 Echo: EF 55-60%, Gr1 DD; d. 08/2019 Echo: EF 60-65%, Gr2 DD.   Obesity    PAH (pulmonary artery hypertension) (HCC)    a. 09/2017 Echo: PASP ; b. 09/2017 RHC: RA 23, RV 84/13, PA 84/29, PCWP 20, CO 3.89, CI 1.89. LVEDP 18; c. 08/2020 RHC: RA 3, RV 30/1, PA 33/11 (20), PCWP 6. CO/CI 6.18/3.36. PVR 2.3 WU.   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 06/2021. Veg on PPM lead. Not candidate for extraction-->s/p 6 wks abx.   Sepsis with metabolic encephalopathy (HCC)    Sleep apnea    Streptococcal bacteremia    Stroke [facility]) 2014   Subarachnoid hemorrhage (HCC) 01/19/2013   Symptomatic bradycardia    a. s/p BSX  PPM.   Thyroid nodule    Type II diabetes mellitus (HCC)    Urinary incontinence    Medications:  PTA apixaban - last dose taken 1/25 @ 0752  Assessment: Debra Saunders is an 78 year old female that was seen in her cardiologist office on 07/17/2023 due to three day chest pain she was experiencing. Mildly elevated troponin of 29. From chart review, patient takes apixaban 5 mg BID at home for atrial fibrillation. Pharmacy has been consulted for initiation and management of a heparin infusion for tentatively planned left and right heart cath on Monday. Baseline labs: Hgb 9.8, PLT 176, aPTT, HL, and INR have been ordered.   Goal of Therapy:  Heparin level 0.3-0.7 units/ml Monitor platelets by anticoagulation protocol: Yes   Plan:  Last dose of apixaban taken this morning - will  avoid an initial bolus  Start heparin infusion at 850 units/hr when next dose of apixaban would have been due  Check aPTT level 8 hours after initiation of infusion  Will monitor aPTT levels until correlation with HL  CBC stable; monitor daily and for signs/symptoms of bleeding   Debra Saunders, PharmD Pharmacy Resident  07/18/2023 2:10 PM

## 2023-07-18 NOTE — ED Notes (Signed)
Patient states she has not been able to sleep all night; states, "They never brought me a Cpap machine, I would dose off and then wake right back up."  Patient is requesting machine so that she can attempt to nap.  RT notified at this time; Carollee Herter, RT states it will be a while because she is in the middle of patient rounds.    Delay explained to patient.

## 2023-07-18 NOTE — Assessment & Plan Note (Signed)
-   We will replace potassium and check magnesium level.

## 2023-07-19 DIAGNOSIS — I482 Chronic atrial fibrillation, unspecified: Secondary | ICD-10-CM | POA: Diagnosis not present

## 2023-07-19 DIAGNOSIS — I5033 Acute on chronic diastolic (congestive) heart failure: Secondary | ICD-10-CM | POA: Diagnosis not present

## 2023-07-19 DIAGNOSIS — I214 Non-ST elevation (NSTEMI) myocardial infarction: Secondary | ICD-10-CM | POA: Diagnosis not present

## 2023-07-19 DIAGNOSIS — J849 Interstitial pulmonary disease, unspecified: Secondary | ICD-10-CM | POA: Diagnosis not present

## 2023-07-19 LAB — BASIC METABOLIC PANEL
Anion gap: 9 (ref 5–15)
BUN: 10 mg/dL (ref 8–23)
CO2: 28 mmol/L (ref 22–32)
Calcium: 8.2 mg/dL — ABNORMAL LOW (ref 8.9–10.3)
Chloride: 102 mmol/L (ref 98–111)
Creatinine, Ser: 0.73 mg/dL (ref 0.44–1.00)
GFR, Estimated: >60 mL/min (ref >60)
Glucose, Bld: 91 mg/dL (ref 70–99)
Potassium: 3 mmol/L — ABNORMAL LOW (ref 3.5–5.1)
Sodium: 139 mmol/L (ref 135–145)

## 2023-07-19 LAB — APTT
aPTT: 58 s — ABNORMAL HIGH (ref 24–36)
aPTT: 54 s — ABNORMAL HIGH (ref 24–36)

## 2023-07-19 LAB — CBG MONITORING, ED
Glucose-Capillary: 83 mg/dL (ref 70–99)
Glucose-Capillary: 87 mg/dL (ref 70–99)
Glucose-Capillary: 94 mg/dL (ref 70–99)

## 2023-07-19 LAB — HEPARIN LEVEL (UNFRACTIONATED): Heparin Unfractionated: >1.1 [IU]/mL — ABNORMAL HIGH (ref 0.30–0.70)

## 2023-07-19 LAB — GLUCOSE, CAPILLARY: Glucose-Capillary: 136 mg/dL — ABNORMAL HIGH (ref 70–99)

## 2023-07-19 MED ORDER — HEPARIN BOLUS VIA INFUSION
2000.0000 [IU] | Freq: Once | INTRAVENOUS | Status: AC
  Administered 2023-07-19: 2000 [IU] via INTRAVENOUS
  Filled 2023-07-19: qty 2000

## 2023-07-19 MED ORDER — SODIUM CHLORIDE 0.9 % IV SOLN: INTRAVENOUS | Status: DC

## 2023-07-19 MED ORDER — HEPARIN BOLUS VIA INFUSION
1000.0000 [IU] | Freq: Once | INTRAVENOUS | Status: AC
  Administered 2023-07-19: 1000 [IU] via INTRAVENOUS
  Filled 2023-07-19: qty 1000

## 2023-07-19 MED ORDER — POTASSIUM CHLORIDE CRYS ER 20 MEQ PO TBCR
40.0000 meq | EXTENDED_RELEASE_TABLET | Freq: Once | ORAL | Status: AC
  Administered 2023-07-19: 40 meq via ORAL
  Filled 2023-07-19: qty 2

## 2023-07-19 NOTE — ED Notes (Signed)
Pt placed on cpap for hs RT notified

## 2023-07-19 NOTE — ED Notes (Signed)
This tech assisted pt to bedside commode and then helped her back in bed. This tech put pt in a new brief and placed purewick back in place. Pt has no further complaints at this time.

## 2023-07-19 NOTE — Progress Notes (Signed)
Patient Name: Debra Saunders Date of Encounter: 07/19/2023 [facility] Cardiologist: Debra Bears, MD   Interval Summary  .    Patient seen on a.m. rounds.  States that throughout the night she has had some chest pressure off and on.  Denies any worsening shortness of breath.  Vital Signs .    Vitals:   07/19/23 0515 07/19/23 0600 07/19/23 0630 07/19/23 0947  BP:  (!) 156/82  131/74  Pulse: (!) 54 65 60 63  Resp: 13 20 18 16   Temp:    (!) 97.5 F (36.4 C)  TempSrc:    Oral  SpO2: 98% 91% 94% 93%  Weight:      Height:        Intake/Output Summary (Last 24 hours) at 07/19/2023 1047 Last data filed at 07/18/2023 1359 Gross per 24 hour  Intake 201.51 ml  Output 2000 ml  Net -1798.49 ml      07/17/2023    3:32 PM 07/17/2023    2:28 PM 04/21/2023    9:27 AM  Last 3 Weights  Weight (lbs) 196 lb 3.4 oz 197 lb 2 oz 202 lb  Weight (kg) 89 kg 89.415 kg 91.627 kg      Telemetry/ECG    Atrial fibrillation that is rate controlled and occasional ventricular pacing noted overnight- Personally Reviewed  Physical Exam .   GEN: No acute distress.  Chronically ill-appearing Neck: Unable to determine JVD Cardiac: IR IR, no murmurs, rubs, or gallops.  Respiratory: Diminished to auscultation bilaterally.  Patient currently on CPAP GI: Soft, nontender, obese, non-distended  MS: Left greater than right lower extremity edema  Assessment & Plan .     Chronic HFpEF -Presented with worsening shortness of breath and chest discomfort -BNP elevated at 335 -No acute findings on chest x-ray -Continued improvement in breathing with diuresis -Continued on furosemide, spironolactone, and Farxiga -Echocardiogram ordered and pending with further recommendations to follow - -1.7 L output in the last 24 hours -Heart failure education -Daily weights, I's and O's, low-sodium diet  Chest pain/mildly elevated high-sensitivity troponin -Presented with chest discomfort that was  typical and atypical -High-sensitivity troponins peaked -Prior discussion of stressing versus left heart catheterization for further ischemic evaluation patient is agreeable to right and left heart catheterization to be completed on Monday -N.p.o. after midnight -Continue to have some chest discomfort overnight primarily with exertion -Continue with telemetry monitoring -EKG as needed for pain or changes -Continued on statin therapy and heparin infusion per pharmacy protocol  Persistent atrial fibrillation -Rate controlled atrial fibrillation noted on telemetry -Likely contributing to heart failure exacerbation -Would benefit from DCCV after heart catheterization has been completed -If it anytime she is not therapeutic on heparin while holding her apixaban that she will require TEE prior to DCCV -She will need to be transition from heparin back to apixaban prior to discharge for CHA2DS2-VASc score of at least 9 for stroke prophylaxis -Continue with telemetry monitoring  Hypokalemia -Serum potassium on arrival 2.5 -This morning 3.0 -Potassium supplementation ordered -Recommend keeping potassium greater than 4 less than 5 -Monitor/trend/replete electrolytes as needed -Magnesium 2.1 -Daily BMP  Orthostatic hypotension -Blood pressure 131/74 -Continue midodrine for systolic blood pressure less than 120 mmHg -Vital signs per unit protocol  Pulmonary arterial hypertension -Continue selexipag and sildenafil -Continue with chronic oxygen and CPAP at bedtime  Hyperlipidemia -Continued on rosuvastatin  Informed Consent   Shared Decision Making/Informed Consent The risks [stroke (1 in 1000), death (1 in 1000), kidney failure [place]  temporary] (1 in 500), bleeding (1 in 200), allergic reaction [possibly serious] (1 in 200)], benefits (diagnostic support and management of coronary artery disease) and alternatives of a cardiac catheterization were discussed in detail with Debra Saunders and  she is willing to proceed.     For questions or updates, please contact [facility] Please consult [email or web address] for contact info under        Signed, Debra Bramlett, NP

## 2023-07-19 NOTE — Progress Notes (Signed)
[facility]  - [place] at [facility]   PATIENT NAME: Debra Saunders    MR#:  [id number]  DATE OF BIRTH:  1946/01/11  SUBJECTIVE:   Came in with increasing shortness of breath. Started on IV Lasix. Good urine output. Overall feeling better. Denies chest pain today   VITALS:  Blood pressure (!) 121/59, pulse (!) 56, temperature (!) 97.5 F (36.4 C), temperature source Oral, resp. rate (!) 22, height 5' (1.524 m), weight 89 kg, SpO2 100%.  PHYSICAL EXAMINATION:   GENERAL:  78 y.o.-year-old patient with no acute distress.  LUNGS: decreased breath sounds bilaterally, no wheezing CARDIOVASCULAR: S1, S2 normal. No murmur   ABDOMEN: Soft, nontender, nondistended. Bowel sounds present.  EXTREMITIES: + edema b/l.    NEUROLOGIC: nonfocal  patient is alert and awake SKIN: No obvious rash, lesion, or ulcer.   LABORATORY PANEL:  CBC Recent Labs  Lab 07/18/23 0630  WBC 4.4  HGB 9.8*  HCT 33.5*  PLT 176    Chemistries  Recent Labs  Lab 07/18/23 0630 07/19/23 0604  NA 146* 139  K 2.7* 3.0*  CL 102 102  CO2 29 28  GLUCOSE 112* 91  BUN 12 10  CREATININE 0.78 0.73  CALCIUM 8.9 8.2*  MG 2.1  --    Cardiac Enzymes No results for input(s): "TROPONINI" in the last 168 hours. RADIOLOGY:  CUP PACEART INCLINIC DEVICE CHECK Result Date: 07/17/2023 Normal in-clinic Pacemaker check. Thresholds, sensing, and impedance WNL or stable for patient over time. Persis AFib since gen change with elevated Ventricular rates. Estimated longevity __13.3 years__. Pt enrolled in remote follow-up. Percell Belt, NP  DG Chest 2 View Result Date: 07/17/2023 CLINICAL DATA:  Shortness of breath.  Chest pain. EXAM: CHEST - 2 VIEW COMPARISON:  10/24/2022. FINDINGS: Low lung volume. Redemonstration of increased interstitial markings throughout bilateral lungs, which corresponds to chronic interstitial lung disease on the prior CT scan. No acute consolidation or lung collapse. Bilateral costophrenic  angles are clear. Stable moderately enlarged cardio-mediastinal silhouette. There is a left sided 2-lead pacemaker. No acute osseous abnormalities. The soft tissues are within normal limits. IMPRESSION: *No active cardiopulmonary disease. Chronic interstitial lung disease. Electronically Signed   By: Jules Schick M.D.   On: 07/17/2023 16:23    Assessment and Plan  Debra Saunders is a 78 y.o. 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  Patient's in device was interrogated in the clinic she was found to be in a fib RVR a few times. The recommended she be admitted to the hospital for evaluation consider cardioversion.  High-sensitivity troponin 24-- 25-- 29. BNP was 335. Patient was started on IV Lasix and has put out good amount of urine. She is overall feeling a lot better. She recently had battery change in her pacemaker. She takes eliquis.  Congestive heart failure acute on chronic diastolic -- came in with chest tightness and shortness of breath with elevated BNP 335 -- no acute chest x-ray findings but clinically feels short of breath --Currently on Lasix IV 40 mg BID. Monitor creatinine and sodium and adjust Lasix accordingly -- echo pending --cont farxiga,  spironolactone  Chest pain with mildly elevated troponin -- per cardiology plan for cath on Monday -- hold eliquis and started heparin by Dr. [facility] Salvia  Persistent a fib -- patient's device was interrogated and noted to be in a fib few times. -- Plan for DC CV after cath per  [facility] cardiology  Pulmonary hypertension obstructive sleep apnea --cont selexipag and sildenafil -- CPAP nightly  Chronic endocarditis with lead vegetation -- not a candidate for pacemaker extraction -- continue chronic PO prophylactic antibiotic  History of orthostatic hypotension --midodrine    Procedures: Family communication :none Consults : [facility] cardiology CODE STATUS: full DVT Prophylaxis : heparin drip Level of care: Telemetry Medical Status is: Inpatient Remains inpatient appropriate because: chf, afib, needs cath and DCCV on monday    TOTAL TIME TAKING CARE OF THIS PATIENT: 35 minutes.  >50% time spent on counselling and coordination of care  Note: This dictation was prepared with Dragon dictation along with smaller phrase technology. Any transcriptional errors that result from this process are unintentional.  Enedina Finner M.D    [facility]   CC: Primary care physician; Glori Luis, MD

## 2023-07-19 NOTE — H&P (View-Only) (Signed)
Patient Name: Debra Saunders Date of Encounter: 07/19/2023 [facility] Cardiologist: [name], MD   Interval Summary  .    Patient seen on a.m. rounds.  States that throughout the night she has had some chest pressure off and on.  Denies any worsening shortness of breath.  Vital Signs .    Vitals:   07/19/23 0515 07/19/23 0600 07/19/23 0630 07/19/23 0947  BP:  (!) 156/82  131/74  Pulse: (!) 54 65 60 63  Resp: 13 20 18 16   Temp:    (!) 97.5 F (36.4 C)  TempSrc:    Oral  SpO2: 98% 91% 94% 93%  Weight:      Height:        Intake/Output Summary (Last 24 hours) at 07/19/2023 1047 Last data filed at 07/18/2023 1359 Gross per 24 hour  Intake 201.51 ml  Output 2000 ml  Net -1798.49 ml      07/17/2023    3:32 PM 07/17/2023    2:28 PM 04/21/2023    9:27 AM  Last 3 Weights  Weight (lbs) 196 lb 3.4 oz 197 lb 2 oz 202 lb  Weight (kg) 89 kg 89.415 kg 91.627 kg      Telemetry/ECG    Atrial fibrillation that is rate controlled and occasional ventricular pacing noted overnight- Personally Reviewed  Physical Exam .   GEN: No acute distress.  Chronically ill-appearing Neck: Unable to determine JVD Cardiac: IR IR, no murmurs, rubs, or gallops.  Respiratory: Diminished to auscultation bilaterally.  Patient currently on CPAP GI: Soft, nontender, obese, non-distended  MS: Left greater than right lower extremity edema  Assessment & Plan .     Chronic HFpEF -Presented with worsening shortness of breath and chest discomfort -BNP elevated at 335 -No acute findings on chest x-ray -Continued improvement in breathing with diuresis -Continued on furosemide, spironolactone, and Farxiga -Echocardiogram ordered and pending with further recommendations to follow - -1.7 L output in the last 24 hours -Heart failure education -Daily weights, I's and O's, low-sodium diet  Chest pain/mildly elevated high-sensitivity troponin -Presented with chest discomfort that was  typical and atypical -High-sensitivity troponins peaked -Prior discussion of stressing versus left heart catheterization for further ischemic evaluation patient is agreeable to right and left heart catheterization to be completed on Monday -N.p.o. after midnight -Continue to have some chest discomfort overnight primarily with exertion -Continue with telemetry monitoring -EKG as needed for pain or changes -Continued on statin therapy and heparin infusion per pharmacy protocol  Persistent atrial fibrillation -Rate controlled atrial fibrillation noted on telemetry -Likely contributing to heart failure exacerbation -Would benefit from DCCV after heart catheterization has been completed -If it anytime she is not therapeutic on heparin while holding her apixaban that she will require TEE prior to DCCV -She will need to be transition from heparin back to apixaban prior to discharge for CHA2DS2-VASc score of at least 9 for stroke prophylaxis -Continue with telemetry monitoring  Hypokalemia -Serum potassium on arrival 2.5 -This morning 3.0 -Potassium supplementation ordered -Recommend keeping potassium greater than 4 less than 5 -Monitor/trend/replete electrolytes as needed -Magnesium 2.1 -Daily BMP  Orthostatic hypotension -Blood pressure 131/74 -Continue midodrine for systolic blood pressure less than 120 mmHg -Vital signs per unit protocol  Pulmonary arterial hypertension -Continue selexipag and sildenafil -Continue with chronic oxygen and CPAP at bedtime  Hyperlipidemia -Continued on rosuvastatin  Informed Consent   Shared Decision Making/Informed Consent The risks [stroke (1 in 1000), death (1 in 1000), kidney failure [place]  temporary] (1 in 500), bleeding (1 in 200), allergic reaction [possibly serious] (1 in 200)], benefits (diagnostic support and management of coronary artery disease) and alternatives of a cardiac catheterization were discussed in detail with [name] and  she is willing to proceed.     For questions or updates, please contact [facility] Please consult [email or web address] for contact info under        Signed, Taia Bramlett, NP

## 2023-07-19 NOTE — Consult Note (Signed)
PHARMACY - ANTICOAGULATION CONSULT NOTE  Pharmacy Consult for Heparin Indication: chest pain/ACS  Allergies  Allergen Reactions   Aspirin Anaphylaxis and Hives    Pt states that she is unable to take because she had bleeding in her brain   Other Other (See Comments)    Pt states that she is unable to take blood thinners because she had bleeding in her brain; Blood Thinners-per doctor at [facility] (Xarelto)   Patient Measurements: Height: 5' (152.4 cm) Weight: 89 kg (196 lb 3.4 oz) IBW/kg (Calculated) : 45.5 Heparin Dosing Weight: 66.5 kg   Vital Signs: Temp: 97.7 F (36.5 C) (01/26 0104) Temp Source: Oral (01/26 0104) BP: 156/82 (01/26 0600) Pulse Rate: 60 (01/26 0630)  Labs: Recent Labs    07/17/23 1536 07/17/23 1734 07/18/23 0630 07/18/23 1502 07/19/23 0604  HGB 10.3*  --  9.8*  --   --   HCT 34.8*  --  33.5*  --   --   PLT 174  --  176  --   --   APTT  --   --   --  31 58*  LABPROT  --   --   --  16.1*  --   INR  --   --   --  1.3*  --   HEPARINUNFRC  --   --   --  >1.10* >1.10*  CREATININE 0.92  --  0.78  --   --   TROPONINIHS 24* 25* 29* 32*  --    Estimated Creatinine Clearance: 55.7 mL/min (by C-G formula based on SCr of 0.78 mg/dL).  Medical History: Past Medical History:  Diagnosis Date   Arthritis    Atrial fibrillation, persistent (HCC)    a. s/p TEE-guided DCCV on 08/20/2017; b. 06/2021 Recurrent Afib-->amio/TEE/DCCV.   CAD (coronary artery disease)    a. 01/2012 Cath: nonobs dzs; b. 04/2014 MV: No ischemia; c. 09/2017 Cath: Minimal nonobs RCA dzs, otw nl cors.   Cardiorenal syndrome    a. 09/2017 Creat rose to 2.7 w/ diuresis-->HD x 2.   Chronic back pain    Chronic combined systolic (congestive) and diastolic (congestive) heart failure (HCC)    a. Dx 2005 @ [facility];  b. 02/2014 Echo: EF 55-60%; c. 07/2017: EF 30-35%; d. 09/2017 Echo: EF 40-45%; e. 02/2018 Echo: EF 55-60%; f. 08/2019 Echo: EF 60-65%; g. 08/2020 RHC: RA 3, RV 30/1, PA 33/11 (20), PCWP 6; f.  08/2021 Echo: EF 60-65%, no rwma, GrIDD, nl RV fxn, RVSP 40.47mmHg, mod dil LA, mod TR.   Chronic respiratory failure (HCC)    a. on home O2   Gastritis    History of COVID-19    History of endocarditis 08/22/2021   History of intracranial hemorrhage    a. 6/14 described by neurosurgery as "small frontal posttraumatic SAH " - pt was on xarelto @ the time.   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 Chronic LLE edema.   NICM (nonischemic cardiomyopathy) (HCC)    a. 09/2017 Echo: EF 40-45%; b. 09/2017 Cath: minor, insignificant CAD; c. 02/2018 Echo: EF 55-60%, Gr1 DD; d. 08/2019 Echo: EF 60-65%, Gr2 DD.   Obesity    PAH (pulmonary artery hypertension) (HCC)    a. 09/2017 Echo: PASP ; b. 09/2017 RHC: RA 23, RV 84/13, PA 84/29, PCWP 20, CO 3.89, CI 1.89.  LVEDP 18; c. 08/2020 RHC: RA 3, RV 30/1, PA 33/11 (20), PCWP 6. CO/CI 6.18/3.36. PVR 2.3 WU.   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 06/2021. Veg on PPM lead. Not candidate for extraction-->s/p 6 wks abx.   Sepsis with metabolic encephalopathy (HCC)    Sleep apnea    Streptococcal bacteremia    Stroke [facility]) 2014   Subarachnoid hemorrhage (HCC) 01/19/2013   Symptomatic bradycardia    a. s/p BSX PPM.   Thyroid nodule    Type II diabetes mellitus (HCC)    Urinary incontinence    Medications:  PTA apixaban - last dose taken 1/25 @ 0752  Assessment: Debra Saunders is an 78 year old female that was seen in her cardiologist office on 07/17/2023 due to three day chest pain she was experiencing. Mildly elevated troponin of 29. From chart review, patient takes apixaban 5 mg BID at home for atrial fibrillation. Pharmacy has been consulted for initiation and management of a heparin infusion for tentatively planned left and right heart cath on Monday. Baseline labs: Hgb  9.8, PLT 176, aPTT, HL, and INR have been ordered.   Goal of Therapy:  Heparin level 0.3-0.7 units/ml Monitor platelets by anticoagulation protocol: Yes  Date/Time: aPTT/HL: Comment/Rate: 1/26@0604  58/>1.10 Subtherapeutic@850  units/hr   Plan:  Bolus 1000 units x 1 Increase heparin infusion at 1000 units/hr  Check aPTT level 8 hours after rate change Will monitor aPTT levels until correlation with HL  CBC stable; monitor daily and for signs/symptoms of bleeding   Bettey Costa, PharmD Clinical Pharmacist 07/19/2023 7:27 AM

## 2023-07-19 NOTE — Consult Note (Signed)
PHARMACY - ANTICOAGULATION CONSULT NOTE  Pharmacy Consult for Heparin Infusion Indication: chest pain/ACS  Allergies  Allergen Reactions   Aspirin Anaphylaxis and Hives    Pt states that she is unable to take because she had bleeding in her brain   Other Other (See Comments)    Pt states that she is unable to take blood thinners because she had bleeding in her brain; Blood Thinners-per doctor at [facility] (Xarelto)   Patient Measurements: Height: 5' (152.4 cm) Weight: 89 kg (196 lb 3.4 oz) IBW/kg (Calculated) : 45.5 Heparin Dosing Weight: 66.5 kg   Vital Signs: Temp: 97.8 F (36.6 C) (01/26 1648) Temp Source: Oral (01/26 1648) BP: 110/47 (01/26 1648) Pulse Rate: 51 (01/26 1648)  Labs: Recent Labs    07/17/23 1536 07/17/23 1734 07/18/23 0630 07/18/23 1502 07/19/23 0604 07/19/23 1652  HGB 10.3*  --  9.8*  --   --   --   HCT 34.8*  --  33.5*  --   --   --   PLT 174  --  176  --   --   --   APTT  --   --   --  31 58* 54*  LABPROT  --   --   --  16.1*  --   --   INR  --   --   --  1.3*  --   --   HEPARINUNFRC  --   --   --  >1.10* >1.10*  --   CREATININE 0.92  --  0.78  --  0.73  --   TROPONINIHS 24* 25* 29* 32*  --   --    Estimated Creatinine Clearance: 55.7 mL/min (by C-G formula based on SCr of 0.73 mg/dL).  Medical History: Past Medical History:  Diagnosis Date   Arthritis    Atrial fibrillation, persistent (HCC)    a. s/p TEE-guided DCCV on 08/20/2017; b. 06/2021 Recurrent Afib-->amio/TEE/DCCV.   CAD (coronary artery disease)    a. 01/2012 Cath: nonobs dzs; b. 04/2014 MV: No ischemia; c. 09/2017 Cath: Minimal nonobs RCA dzs, otw nl cors.   Cardiorenal syndrome    a. 09/2017 Creat rose to 2.7 w/ diuresis-->HD x 2.   Chronic back pain    Chronic combined systolic (congestive) and diastolic (congestive) heart failure (HCC)    a. Dx 2005 @ [facility];  b. 02/2014 Echo: EF 55-60%; c. 07/2017: EF 30-35%; d. 09/2017 Echo: EF 40-45%; e. 02/2018 Echo: EF 55-60%; f. 08/2019 Echo:  EF 60-65%; g. 08/2020 RHC: RA 3, RV 30/1, PA 33/11 (20), PCWP 6; f. 08/2021 Echo: EF 60-65%, no rwma, GrIDD, nl RV fxn, RVSP 40.29mmHg, mod dil LA, mod TR.   Chronic respiratory failure (HCC)    a. on home O2   Gastritis    History of COVID-19    History of endocarditis 08/22/2021   History of intracranial hemorrhage    a. 6/14 described by neurosurgery as "small frontal posttraumatic SAH " - pt was on xarelto @ the time.   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 Chronic LLE edema.   NICM (nonischemic cardiomyopathy) (HCC)    a. 09/2017 Echo: EF 40-45%; b. 09/2017 Cath: minor, insignificant CAD; c. 02/2018 Echo: EF 55-60%, Gr1 DD; d. 08/2019 Echo: EF 60-65%, Gr2 DD.   Obesity    PAH (pulmonary  artery hypertension) (HCC)    a. 09/2017 Echo: PASP ; b. 09/2017 RHC: RA 23, RV 84/13, PA 84/29, PCWP 20, CO 3.89, CI 1.89. LVEDP 18; c. 08/2020 RHC: RA 3, RV 30/1, PA 33/11 (20), PCWP 6. CO/CI 6.18/3.36. PVR 2.3 WU.   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 06/2021. Veg on PPM lead. Not candidate for extraction-->s/p 6 wks abx.   Sepsis with metabolic encephalopathy (HCC)    Sleep apnea    Streptococcal bacteremia    Stroke [facility]) 2014   Subarachnoid hemorrhage (HCC) 01/19/2013   Symptomatic bradycardia    a. s/p BSX PPM.   Thyroid nodule    Type II diabetes mellitus (HCC)    Urinary incontinence    Medications:  PTA apixaban - last dose taken 1/25 @ 0752  Assessment: Debra Saunders is a 78 y.o. female presenting with chest pain. 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 Patient was on  [facility] PTA per chart review. Last dose of apixaban was 1/25 at 0752.Pharmacy has been consulted to initiate and manage heparin infusion.   Baseline Labs: aPTT 31, HL >1.10, PT 16.1, INR 1.3, Hgb 9.8, Hct 33.5, Plt 176   Goal of Therapy:  Heparin level 0.3-0.7 units/ml aPTT 66-102 seconds Monitor platelets by anticoagulation protocol: Yes  Date Time aPTT/HL Rate/Comment  1/26 0604 58/>1.10 850/subtherapeutic 1/26 1719 54/---  1000/subtherapeutic  Plan:  Give 2000 units bolus x 1 Increase heparin infusion to 1200 units/hr Check aPTT in 8 hours Continue to monitor aPTT until HL and aPTT correlate.  Check HL daily for correlation until HL and aPTT correlate. Switch to HL monitoring once HL and aPTT correlate.  Continue to monitor H&H and platelets daily while on heparin infusion    Celene Squibb, PharmD Clinical Pharmacist 07/19/2023 5:27 PM

## 2023-07-19 NOTE — ED Notes (Signed)
Advised nurse that patient has ready bed

## 2023-07-20 ENCOUNTER — Encounter: Payer: Self-pay | Admitting: Cardiovascular Disease

## 2023-07-20 ENCOUNTER — Encounter
Admission: EM | Disposition: A | Discharge status: Home / Self Care | Payer: Self-pay | Source: Home / Self Care | Attending: Internal Medicine

## 2023-07-20 DIAGNOSIS — I482 Chronic atrial fibrillation, unspecified: Secondary | ICD-10-CM | POA: Diagnosis not present

## 2023-07-20 DIAGNOSIS — I5033 Acute on chronic diastolic (congestive) heart failure: Secondary | ICD-10-CM | POA: Diagnosis not present

## 2023-07-20 DIAGNOSIS — R079 Chest pain, unspecified: Secondary | ICD-10-CM | POA: Diagnosis not present

## 2023-07-20 DIAGNOSIS — I272 Pulmonary hypertension, unspecified: Secondary | ICD-10-CM

## 2023-07-20 DIAGNOSIS — E785 Hyperlipidemia, unspecified: Secondary | ICD-10-CM | POA: Diagnosis not present

## 2023-07-20 HISTORY — PX: RIGHT/LEFT HEART CATH AND CORONARY ANGIOGRAPHY: CATH118266

## 2023-07-20 LAB — BASIC METABOLIC PANEL
Anion gap: 11 (ref 5–15)
BUN: 16 mg/dL (ref 8–23)
CO2: 30 mmol/L (ref 22–32)
Calcium: 8.9 mg/dL (ref 8.9–10.3)
Chloride: 100 mmol/L (ref 98–111)
Creatinine, Ser: 0.88 mg/dL (ref 0.44–1.00)
GFR, Estimated: >60 mL/min (ref >60)
Glucose, Bld: 105 mg/dL — ABNORMAL HIGH (ref 70–99)
Potassium: 3.6 mmol/L (ref 3.5–5.1)
Sodium: 141 mmol/L (ref 135–145)

## 2023-07-20 LAB — POCT I-STAT 7, (LYTES, BLD GAS, ICA,H+H)
Acid-Base Excess: 6 mmol/L — ABNORMAL HIGH (ref 0.0–2.0)
Bicarbonate: 32.8 mmol/L — ABNORMAL HIGH (ref 20.0–28.0)
Calcium, Ion: 1.21 mmol/L (ref 1.15–1.40)
HCT: 33 % — ABNORMAL LOW (ref 36.0–46.0)
Hemoglobin: 11.2 g/dL — ABNORMAL LOW (ref 12.0–15.0)
O2 Saturation: 98 %
Potassium: 3.5 mmol/L (ref 3.5–5.1)
Sodium: 141 mmol/L (ref 135–145)
TCO2: 35 mmol/L — ABNORMAL HIGH (ref 22–32)
pCO2 arterial: 56.7 mm[Hg] — ABNORMAL HIGH (ref 32–48)
pH, Arterial: 7.37 (ref 7.35–7.45)
pO2, Arterial: 108 mm[Hg] (ref 83–108)

## 2023-07-20 LAB — MAGNESIUM: Magnesium: 2.1 mg/dL (ref 1.7–2.4)

## 2023-07-20 LAB — GLUCOSE, CAPILLARY
Glucose-Capillary: 109 mg/dL — ABNORMAL HIGH (ref 70–99)
Glucose-Capillary: 97 mg/dL (ref 70–99)
Glucose-Capillary: 125 mg/dL — ABNORMAL HIGH (ref 70–99)
Glucose-Capillary: 105 mg/dL — ABNORMAL HIGH (ref 70–99)

## 2023-07-20 LAB — CBC
HCT: 34.3 % — ABNORMAL LOW (ref 36.0–46.0)
Hemoglobin: 10.2 g/dL — ABNORMAL LOW (ref 12.0–15.0)
MCH: 25.5 pg — ABNORMAL LOW (ref 26.0–34.0)
MCHC: 29.7 g/dL — ABNORMAL LOW (ref 30.0–36.0)
MCV: 85.8 fL (ref 80.0–100.0)
Platelets: 206 10*3/uL (ref 150–400)
RBC: 4 MIL/uL (ref 3.87–5.11)
RDW: 14.1 % (ref 11.5–15.5)
WBC: 5.5 10*3/uL (ref 4.0–10.5)
nRBC: 0 % (ref 0.0–0.2)

## 2023-07-20 LAB — POCT I-STAT EG7
Acid-Base Excess: 8 mmol/L — ABNORMAL HIGH (ref 0.0–2.0)
Bicarbonate: 35.4 mmol/L — ABNORMAL HIGH (ref 20.0–28.0)
Calcium, Ion: 1.24 mmol/L (ref 1.15–1.40)
HCT: 33 % — ABNORMAL LOW (ref 36.0–46.0)
Hemoglobin: 11.2 g/dL — ABNORMAL LOW (ref 12.0–15.0)
O2 Saturation: 61 %
Potassium: 3.5 mmol/L (ref 3.5–5.1)
Sodium: 140 mmol/L (ref 135–145)
TCO2: 37 mmol/L — ABNORMAL HIGH (ref 22–32)
pCO2, Ven: 64.1 mm[Hg] — ABNORMAL HIGH (ref 44–60)
pH, Ven: 7.35 (ref 7.25–7.43)
pO2, Ven: 35 mm[Hg] (ref 32–45)

## 2023-07-20 LAB — PHOSPHORUS: Phosphorus: 2.6 mg/dL (ref 2.5–4.6)

## 2023-07-20 LAB — HEPARIN LEVEL (UNFRACTIONATED): Heparin Unfractionated: >1.1 [IU]/mL — ABNORMAL HIGH (ref 0.30–0.70)

## 2023-07-20 LAB — APTT: aPTT: 110 s — ABNORMAL HIGH (ref 24–36)

## 2023-07-20 SURGERY — RIGHT/LEFT HEART CATH AND CORONARY ANGIOGRAPHY
Anesthesia: Moderate Sedation

## 2023-07-20 MED ORDER — HEPARIN (PORCINE) IN NACL 1000-0.9 UT/500ML-% IV SOLN
INTRAVENOUS | Status: AC
  Filled 2023-07-20: qty 1000

## 2023-07-20 MED ORDER — SODIUM CHLORIDE 0.9 % IV SOLN: 250.0000 mL | INTRAVENOUS | Status: AC | PRN

## 2023-07-20 MED ORDER — MIDAZOLAM HCL 2 MG/2ML IJ SOLN
INTRAMUSCULAR | Status: DC | PRN
  Administered 2023-07-20: 1 mg via INTRAVENOUS

## 2023-07-20 MED ORDER — TORSEMIDE 20 MG PO TABS
40.0000 mg | ORAL_TABLET | Freq: Every day | ORAL | Status: DC
  Administered 2023-07-21: 40 mg via ORAL
  Filled 2023-07-20 (×2): qty 2

## 2023-07-20 MED ORDER — VERAPAMIL HCL 2.5 MG/ML IV SOLN
INTRAVENOUS | Status: DC | PRN
  Administered 2023-07-20: 2.5 mg via INTRAVENOUS

## 2023-07-20 MED ORDER — FENTANYL CITRATE (PF) 100 MCG/2ML IJ SOLN
INTRAMUSCULAR | Status: AC
  Filled 2023-07-20: qty 2

## 2023-07-20 MED ORDER — HEPARIN (PORCINE) IN NACL 2000-0.9 UNIT/L-% IV SOLN
INTRAVENOUS | Status: DC | PRN
  Administered 2023-07-20: 1000 mL

## 2023-07-20 MED ORDER — FENTANYL CITRATE (PF) 100 MCG/2ML IJ SOLN
INTRAMUSCULAR | Status: DC | PRN
  Administered 2023-07-20: 25 ug via INTRAVENOUS

## 2023-07-20 MED ORDER — VERAPAMIL HCL 2.5 MG/ML IV SOLN
INTRAVENOUS | Status: AC
  Filled 2023-07-20: qty 2

## 2023-07-20 MED ORDER — SODIUM CHLORIDE 0.9% FLUSH
3.0000 mL | Freq: Two times a day (BID) | INTRAVENOUS | Status: DC
  Administered 2023-07-20 – 2023-07-21 (×3): 3 mL via INTRAVENOUS

## 2023-07-20 MED ORDER — LIDOCAINE HCL 1 % IJ SOLN
INTRAMUSCULAR | Status: AC
  Filled 2023-07-20: qty 20

## 2023-07-20 MED ORDER — MIDAZOLAM HCL 2 MG/2ML IJ SOLN
INTRAMUSCULAR | Status: AC
  Filled 2023-07-20: qty 2

## 2023-07-20 MED ORDER — APIXABAN 5 MG PO TABS
5.0000 mg | ORAL_TABLET | Freq: Two times a day (BID) | ORAL | Status: DC
  Administered 2023-07-20 – 2023-07-21 (×3): 5 mg via ORAL
  Filled 2023-07-20 (×4): qty 1

## 2023-07-20 MED ORDER — SODIUM CHLORIDE 0.9% FLUSH: 3.0000 mL | INTRAVENOUS | Status: DC | PRN

## 2023-07-20 MED ORDER — HEPARIN SODIUM (PORCINE) 1000 UNIT/ML IJ SOLN
INTRAMUSCULAR | Status: AC
  Filled 2023-07-20: qty 10

## 2023-07-20 MED ORDER — IOHEXOL 300 MG/ML  SOLN
INTRAMUSCULAR | Status: DC | PRN
  Administered 2023-07-20: 44 mL

## 2023-07-20 MED ORDER — LIDOCAINE HCL (PF) 1 % IJ SOLN
INTRAMUSCULAR | Status: DC | PRN
  Administered 2023-07-20 (×2): 2 mL

## 2023-07-20 MED ORDER — HEPARIN SODIUM (PORCINE) 1000 UNIT/ML IJ SOLN
INTRAMUSCULAR | Status: DC | PRN
  Administered 2023-07-20: 3000 [IU] via INTRAVENOUS

## 2023-07-20 SURGICAL SUPPLY — 16 items
CATH BALLN WEDGE 5F 110CM (CATHETERS) IMPLANT
CATH INFINITI 5FR JL4 (CATHETERS) IMPLANT
CATH INFINITI AMBI 5FR TG (CATHETERS) IMPLANT
DEVICE RAD TR BAND REGULAR (VASCULAR PRODUCTS) IMPLANT
DRAPE BRACHIAL (DRAPES) IMPLANT
GLIDESHEATH SLEND SS 6F .021 (SHEATH) IMPLANT
GUIDEWIRE .025 260CM (WIRE) IMPLANT
GUIDEWIRE INQWIRE 1.5J.035X260 (WIRE) IMPLANT
INQWIRE 1.5J .035X260CM (WIRE) ×1 IMPLANT
PACK CARDIAC CATH (CUSTOM PROCEDURE TRAY) ×1 IMPLANT
PROTECTION STATION PRESSURIZED (MISCELLANEOUS) ×1 IMPLANT
SET ATX-X65L (MISCELLANEOUS) IMPLANT
SHEATH GLIDE SLENDER 4/5FR (SHEATH) IMPLANT
STATION PROTECTION PRESSURIZED (MISCELLANEOUS) IMPLANT
WIRE HITORQ VERSACORE ST 145CM (WIRE) IMPLANT
WIRE RUNTHROUGH .014X180CM (WIRE) IMPLANT

## 2023-07-20 NOTE — Interval H&P Note (Signed)
History and Physical Interval Note:  07/20/2023 9:15 AM  Debra Saunders  has presented today for surgery, with the diagnosis of chest pain.  The various methods of treatment have been discussed with the patient and family. After consideration of risks, benefits and other options for treatment, the patient has consented to  Procedure(s): RIGHT/LEFT HEART CATH AND CORONARY ANGIOGRAPHY (N/A) as a surgical intervention.  The patient's history has been reviewed, patient examined, no change in status, stable for surgery.  I have reviewed the patient's chart and labs.  Questions were answered to the patient's satisfaction.     [name]

## 2023-07-20 NOTE — Progress Notes (Incomplete)
Heart Failure Stewardship Pharmacy Note  PCP: Glori Luis, MD PCP-Cardiologist: [name], MD  HPI: Debra Saunders is a 78 y.o. female with  interstitial lung disease, nonobstructive coronary artery disease, atrial fibrillation on Eliquis, combined systolic and diastolic CHF, hypertension, type 2 diabetes mellitus, dyslipidemia, bradycardia s/p PPM [facility]), chronic respiratory failure on home O2, PAH, CKD (required HD in the past), DVT s/p IVC filter in 2004, COPD, traumatic subarachnoid hemorrhage while on xarelto 2014, CVA 2014, chronic lymphedema, and OSA who presented with worsening dyspnea on exertion, chest pain, orthopnea, PND, and lower extremity edema. BNP on admission was 335.5. HS-troponin on admission was 24. Chest XR on admission noted chronic interstitial lung disease.   Pertinent cardiac history: Echo (03/2018): EF 60-65%, PASP 52 mmHg. RHC (09/2017): Severe PAH with PVR 7.2 WU. RHC (03/2018): mean RA 6, PA 56/17 mean 33, PCWP mean 12, CI 3.23, PVR 3.4 WU. RHC (08/2020): mean 3, RV 30/1, PA 33/11, mean 20, PCWP mean 6, Oxygen saturations: PA 69% AO 100%, Cardiac Output (Fick) 6.18, Cardiac Index (Fick) 3.36, and PVR 2.3 WU. V/Q scan (03/2018): No chronic PE. Echo (08/2019): EF 60-65%, normal RV, PASP 50 mmHg, moderate-severe MR. TEE (06/2021): EF 60-65%, mild RV dilation with normal RV systolic function, MR mild-moderate, moderate TR with vegetation on TV and pacemaker lead. Echo (06/2021): EF 60-65%, normal RV, RVSP 51.5 mmHg, mild-moderate MR, moderate TR. Echo (08/2021): EF 60-65%, mild LVH, normal RV, IVC normal, no definite vegetation noted. RHC (01/2022): RA 3, PA 29/10 (mean 16), PCWP 12, CO/CI (Fick) 6.18/3.37. 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  Pertinent Lab Values: Creatinine  Date Value Ref Range Status  03/24/2023 1.12 (H) 0.44 - 1.00 mg/dL Final   Creat   Date Value Ref Range Status  11/25/2021 1.23 (H) 0.60 - 1.00 mg/dL Final   Creatinine, Ser  Date Value Ref Range Status  07/20/2023 0.88 0.44 - 1.00 mg/dL Final   BUN  Date Value Ref Range Status  07/20/2023 16 8 - 23 mg/dL Final  28/41/3244 23 8 - 27 mg/dL Final  06/25/7251 12 7 - 18 mg/dL Final   Potassium  Date Value Ref Range Status  07/20/2023 3.6 3.5 - 5.1 mmol/L Final  12/28/2013 3.2 (L) 3.5 - 5.1 mmol/L Final   Sodium  Date Value Ref Range Status  07/20/2023 141 135 - 145 mmol/L Final  02/03/2023 144 134 - 144 mmol/L Final  12/28/2013 135 (L) 136 - 145 mmol/L Final   Brain Natriuretic Peptide  Date Value Ref Range Status  07/23/2016 38.0 <100 pg/mL Final    Comment:      BNP levels increase with age in the general population with the highest values seen in individuals greater than 53 years of age. Reference: Earlyne Iba Cardiol 2002; 66:440-34.      B Natriuretic Peptide  Date Value Ref Range Status  07/17/2023 335.5 (H) 0.0 - 100.0 pg/mL Final    Comment:    Performed at [facility], [address] [place], [place] [address]   Magnesium  Date Value Ref Range Status  07/20/2023 2.1 1.7 - 2.4 mg/dL Final    Comment:    Performed at [facility], [address] [place], [place] [address]  11/18/2013 1.7 (L) mg/dL Final    Comment:    5.6-4.3 THERAPEUTIC RANGE: 4-7 mg/dL TOXIC: > 10 mg/dL  -----------------------    Hemoglobin  A1C  Date Value Ref Range Status  02/03/2013 6.2 4.2 - 6.3 % Final    Comment:    The American Diabetes Association recommends that a primary goal of therapy should be <7% and that physicians should reevaluate the treatment regimen in patients with HbA1c values consistently >8%.    Hgb A1c MFr Bld  Date Value Ref Range Status  07/18/2023 5.1 4.8 - 5.6 % Final    Comment:    (NOTE) Pre diabetes:          5.7%-6.4%  Diabetes:              >6.4%  Glycemic control for   <7.0% adults with diabetes     Digoxin Level  Date Value Ref Range Status  08/26/2021 1.5 0.8 - 2.0 ng/mL Final    Comment:    Performed at [facility], [address] [place], [place] [address]   TSH  Date Value Ref Range Status  01/28/2022 0.397 0.350 - 4.500 uIU/mL Final    Comment:    Performed by a 3rd Generation assay with a functional sensitivity of <=0.01 uIU/mL. Performed at [facility], [address] [place], [place] [address]   08/29/2021 0.763 0.450 - 4.500 uIU/mL Final   LDH  Date Value Ref Range Status  06/25/2021 184 98 - 192 U/L Final    Comment:    Performed at [facility], [address] [place], [place] [address]    Vital Signs: Admission weight: Temp:  [97.5 F (36.4 C)-99 F (37.2 C)] 97.8 F (36.6 C) (01/27 0749) Pulse Rate:  [49-63] 54 (01/27 0749) Cardiac Rhythm: Ventricular paced (01/27 0700) Resp:  [16-25] 18 (01/27 0749) BP: (107-155)/(47-89) 155/89 (01/27 0749) SpO2:  [92 %-100 %] 96 % (01/27 0749) Weight:  [86.8 kg (191 lb 5.8 oz)] 86.8 kg (191 lb 5.8 oz) (01/27 0515)  Intake/Output Summary (Last 24 hours) at 07/20/2023 0753 Last data filed at 07/20/2023 9147 Gross per 24 hour  Intake 700.67 ml  Output 1250 ml  Net -549.33 ml    Current Heart Failure Medications:  Loop diuretic: torsemide 40 mg daily Beta-Blocker: ACEI/ARB/ARNI: MRA: SGLT2i: Farxiga 10 mg daily Other:  Prior to admission Heart Failure Medications:  Loop diuretic: torsemide 40 mg daily Beta-Blocker: ACEI/ARB/ARNI: MRA: spironolactone 25 mg daily SGLT2i: Farxiga 10 mg daily (reported not taking) Other: reported not taking midodrine PAH: Uptravi. Sidlenafil,   Assessment: 1. Acute on chronic diastolic heart failure (LVEF 60-65%) with grade 1 diastolic dysfunction, due to presumed NICM. NYHA class *** symptoms.  -Symptoms: -Volume: -Hemodynamics: -SGLT2i: -MRA: -ARNI:  - Plan: 1) Medication changes recommended at this time:  2) Patient  assistance:   3) Education: -To be completed prior to discharge.  *** Medication Assistance / Insurance Benefits Check: Does the patient have prescription insurance?    Type of insurance plan:  Does the patient qualify for medication assistance through manufacturers or grants? {CHL AMB Yes/No/Pending:[id number]}  Eligible grants and/or patient assistance programs: ***  Medication assistance applications in progress: ***  Medication assistance applications approved: *** Approved medication assistance renewals will be completed by: ***  Outpatient Pharmacy: Prior to admission outpatient pharmacy: ***      ***

## 2023-07-20 NOTE — Progress Notes (Signed)
   Patient Name: Debra Saunders Date of Encounter: 07/20/2023 [facility] Cardiologist: [name], MD   Interval Summary  .    Cardiac catheterization was done this morning which showed near normal coronary arteries.  Right heart catheterization showed high normal wedge pressure and moderate pulmonary hypertension.  Vital Signs .    Vitals:   07/20/23 1130 07/20/23 1200 07/20/23 1254 07/20/23 1529  BP: (!) 177/62 (!) 148/64 (!) 142/70 (!) 120/56  Pulse: 64 (!) 55 (!) 54 (!) 54  Resp: (!) 22 15 20 20   Temp:   98.6 F (37 C) 98.7 F (37.1 C)  TempSrc:   Oral Oral  SpO2: 99% 98% 98% 96%  Weight:      Height:        Intake/Output Summary (Last 24 hours) at 07/20/2023 1634 Last data filed at 07/20/2023 1100 Gross per 24 hour  Intake 700.67 ml  Output 725 ml  Net -24.33 ml      07/20/2023    5:15 AM 07/17/2023    3:32 PM 07/17/2023    2:28 PM  Last 3 Weights  Weight (lbs) 191 lb 5.8 oz 196 lb 3.4 oz 197 lb 2 oz  Weight (kg) 86.8 kg 89 kg 89.415 kg      Telemetry/ECG    Atrial fibrillation that is rate controlled and occasional ventricular pacing noted overnight- Personally Reviewed  Physical Exam .   GEN: No acute distress.  Chronically ill-appearing Neck: Unable to determine JVD Cardiac: IR IR, no murmurs, rubs, or gallops.  Respiratory: Diminished to auscultation bilaterally.  Patient currently on CPAP GI: Soft, nontender, obese, non-distended  MS: Left greater than right lower extremity edema  Assessment & Plan .     Acute on chronic HFpEF -Presented with worsening shortness of breath and chest discomfort -BNP elevated at 335 -Improved with IV diuresis.  Based on right heart cath data from today, I switch her to oral torsemide. Suspect that she can likely be discharged home tomorrow.  Chest pain/mildly elevated high-sensitivity troponin -Likely due to supply demand mismatch.  Cardiac catheterization was done this morning and showed near normal  coronary arteries.  Persistent atrial fibrillation -Rate controlled atrial fibrillation noted on telemetry -The patient had multiple cardioversions in the past which was successful but most of the symptoms were in the setting of treatment with amiodarone.  Amiodarone has been used cautiously in the past due to concerns about lung toxicity. I do not think the patient will maintain in sinus rhythm without an antiarrhythmic medication. Continue rate control for now and recommend outpatient EP evaluation to discuss either resuming amiodarone or a different antiarrhythmic medication.  Orthostatic hypotension -Blood pressure has been on the high side.  I discontinued midodrine.  Pulmonary arterial hypertension -Continue selexipag and sildenafil -Continue with chronic oxygen and CPAP at bedtime      Signed, [name], MD

## 2023-07-20 NOTE — Discharge Instructions (Signed)
[facility] offers a [employer] that anyone can call toll free at [phone]. The friendship line is available 24 hours a day  [facility] is a Program of All-inclusive Care for the Elderly (PACE). Their mission is to promote and sustain the independence of seniors wishing to remain in the community. They provide seniors with comprehensive long-term health, social, medical and dietary care. Their program is a safe alternative to nursing home care. [phone]  [facility] Physical Address [facility] [address] [place], [place] [address] Phone: [phone]. . Online zoom yoga class, connect with others without leaving your home [facility] offers [place] dance cardio sessions for individuals via Zoom. This program provides: - Dance fitness activities Please contact program for more information. Servinganyone in need adults 18+ hiv/aids individuals families Call [phone]  Email [email or web address] to get more info  [employer] offers an online [employer] to individuals where they can receive help to focus on their best health. Whether you're a [employer] member or not, the neighborhood center offers a... Main Serviceshealth education  exercise & fitness  community support services  recreation  virtual support Other Servicessupport groups Servinganyone in need adults young adults teens seniors individuals families [email or web address] to get more info  Schedule on their website  The [name] offers an array of activities for adults age 27 and over. This program provides:- Fitness and health programs- Tech classes- Activity books Main Serviceshealth education  community support services  exercise & fitness  recreation  more education Servingseniors  Call [phone]    For more resources go online to [email or web address] and type in you zipcode

## 2023-07-20 NOTE — Progress Notes (Signed)
Confirmed with Cardiologist on-call, Dr. [facility] Salvia, that 2/2 pt's aspirin allergy, no anti-platelet medication needs to be given prior to pt's schedule heart cath today.

## 2023-07-20 NOTE — Consult Note (Signed)
PHARMACY - ANTICOAGULATION CONSULT NOTE  Pharmacy Consult for Heparin Infusion Indication: chest pain/ACS  Allergies  Allergen Reactions   Aspirin Anaphylaxis and Hives    Pt states that she is unable to take because she had bleeding in her brain   Other Other (See Comments)    Pt states that she is unable to take blood thinners because she had bleeding in her brain; Blood Thinners-per doctor at [facility] (Xarelto)   Patient Measurements: Height: 5' (152.4 cm) Weight: 89 kg (196 lb 3.4 oz) IBW/kg (Calculated) : 45.5 Heparin Dosing Weight: 66.5 kg   Vital Signs: Temp: 98.6 F (37 C) (01/27 0032) Temp Source: Oral (01/26 2046) BP: 114/57 (01/27 0032) Pulse Rate: 53 (01/27 0032)  Labs: Recent Labs    07/17/23 1536 07/17/23 1734 07/18/23 0630 07/18/23 1502 07/18/23 1502 07/19/23 0604 07/19/23 1652 07/20/23 0338  HGB 10.3*  --  9.8*  --   --   --   --  10.2*  HCT 34.8*  --  33.5*  --   --   --   --  34.3*  PLT 174  --  176  --   --   --   --  206  APTT  --   --   --  31   < > 58* 54* 110*  LABPROT  --   --   --  16.1*  --   --   --   --   INR  --   --   --  1.3*  --   --   --   --   HEPARINUNFRC  --   --   --  >1.10*  --  >1.10*  --  >1.10*  CREATININE 0.92  --  0.78  --   --  0.73  --  0.88  TROPONINIHS 24* 25* 29* 32*  --   --   --   --    < > = values in this interval not displayed.   Estimated Creatinine Clearance: 50.6 mL/min (by C-G formula based on SCr of 0.88 mg/dL).  Medical History: Past Medical History:  Diagnosis Date   Arthritis    Atrial fibrillation, persistent (HCC)    a. s/p TEE-guided DCCV on 08/20/2017; b. 06/2021 Recurrent Afib-->amio/TEE/DCCV.   CAD (coronary artery disease)    a. 01/2012 Cath: nonobs dzs; b. 04/2014 MV: No ischemia; c. 09/2017 Cath: Minimal nonobs RCA dzs, otw nl cors.   Cardiorenal syndrome    a. 09/2017 Creat rose to 2.7 w/ diuresis-->HD x 2.   Chronic back pain    Chronic combined systolic (congestive) and diastolic  (congestive) heart failure (HCC)    a. Dx 2005 @ [facility];  b. 02/2014 Echo: EF 55-60%; c. 07/2017: EF 30-35%; d. 09/2017 Echo: EF 40-45%; e. 02/2018 Echo: EF 55-60%; f. 08/2019 Echo: EF 60-65%; g. 08/2020 RHC: RA 3, RV 30/1, PA 33/11 (20), PCWP 6; f. 08/2021 Echo: EF 60-65%, no rwma, GrIDD, nl RV fxn, RVSP 40.45mmHg, mod dil LA, mod TR.   Chronic respiratory failure (HCC)    a. on home O2   Gastritis    History of COVID-19    History of endocarditis 08/22/2021   History of intracranial hemorrhage    a. 6/14 described by neurosurgery as "small frontal posttraumatic SAH " - pt was on xarelto @ the time.   Hyperlipidemia    Hypertension    Infection due to implanted cardiac device (HCC) 07/25/2021   Interstitial lung disease (HCC)  Lipoma of back    Lymphedema    a. Chronic LLE edema.   NICM (nonischemic cardiomyopathy) (HCC)    a. 09/2017 Echo: EF 40-45%; b. 09/2017 Cath: minor, insignificant CAD; c. 02/2018 Echo: EF 55-60%, Gr1 DD; d. 08/2019 Echo: EF 60-65%, Gr2 DD.   Obesity    PAH (pulmonary artery hypertension) (HCC)    a. 09/2017 Echo: PASP ; b. 09/2017 RHC: RA 23, RV 84/13, PA 84/29, PCWP 20, CO 3.89, CI 1.89. LVEDP 18; c. 08/2020 RHC: RA 3, RV 30/1, PA 33/11 (20), PCWP 6. CO/CI 6.18/3.36. PVR 2.3 WU.   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 06/2021. Veg on PPM lead. Not candidate for extraction-->s/p 6 wks abx.   Sepsis with metabolic encephalopathy (HCC)    Sleep apnea    Streptococcal bacteremia    Stroke [facility]) 2014   Subarachnoid hemorrhage (HCC) 01/19/2013   Symptomatic bradycardia    a. s/p BSX PPM.   Thyroid nodule    Type II diabetes mellitus (HCC)    Urinary incontinence    Medications:  PTA apixaban - last dose taken 1/25 @ 0752  Assessment: Debra Saunders is a 78 y.o. female presenting with chest pain. 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 Patient was on [facility] PTA per chart review. Last dose of apixaban was 1/25 at 0752.Pharmacy has been consulted to initiate and manage heparin infusion.   Baseline Labs: aPTT 31, HL >1.10, PT 16.1, INR 1.3, Hgb 9.8, Hct 33.5, Plt 176   Goal of Therapy:  Heparin level 0.3-0.7 units/ml aPTT 66-102 seconds Monitor platelets by anticoagulation protocol: Yes  Date Time aPTT/HL Rate/Comment  1/26 0604 58/>1.10 850/subtherapeutic 1/26 1719 54/---  1000/subtherapeutic 1/27     0338   110 / > 1.10     1200/SUPRAtherapeutic  Plan:  1/27 @ 0338:  aPTT = 110,   HL = > 1.10 - aPTT elevated, HL elevated from Eliquis PTA - will decrease heparin drip to 1150 units/hr and recheck aPTT 8 hrs after rate change  - will recheck HL on 1/28 with AM labs.  Continue to monitor aPTT until HL and aPTT correlate.  Check HL daily for correlation until HL and aPTT correlate. Switch to HL monitoring once HL and aPTT correlate.  Continue to monitor H&H and platelets daily while on heparin infusion    Yazan Gatling D, PharmD Clinical Pharmacist 07/20/2023 4:46 AM

## 2023-07-20 NOTE — Progress Notes (Signed)
[facility]  - [place] at [facility] [facility]   PATIENT NAME: Debra Saunders    MR#:  [id number]  DATE OF BIRTH:  10/28/1945  SUBJECTIVE:   Came in with increasing shortness of breath.  Patient underwent right and left heart cath. Discontinued heparin drip. Discontinued IV Lasix per cardiology and switch to oral torsemide. Denies any chest pain.  Spoke with daughter on the phone in the room  VITALS:  Blood pressure (!) 142/70, pulse (!) 54, temperature 98.6 F (37 C), temperature source Oral, resp. rate 20, height 5' (1.524 m), weight 86.8 kg, SpO2 98%.  PHYSICAL EXAMINATION:   GENERAL:  78 y.o.-year-old patient with no acute distress.  LUNGS: decreased breath sounds bilaterally, no wheezing CARDIOVASCULAR: S1, S2 normal. No murmur   ABDOMEN: Soft, nontender, nondistended. Bowel sounds present.  EXTREMITIES: + edema b/l.    NEUROLOGIC: nonfocal  patient is alert and awake SKIN: No obvious rash, lesion, or ulcer.   LABORATORY PANEL:  CBC Recent Labs  Lab 07/20/23 0338 07/20/23 0944  WBC 5.5  --   HGB 10.2* 11.2*  HCT 34.3* 33.0*  PLT 206  --     Chemistries  Recent Labs  Lab 07/20/23 0338 07/20/23 0944  NA 141 140  K 3.6 3.5  CL 100  --   CO2 30  --   GLUCOSE 105*  --   BUN 16  --   CREATININE 0.88  --   CALCIUM 8.9  --   MG 2.1  --    Cardiac Enzymes No results for input(s): "TROPONINI" in the last 168 hours. RADIOLOGY:  CARDIAC CATHETERIZATION Result Date: 07/20/2023 1.  Near normal coronary arteries. 2.  Left ventricular angiography was not performed.  EF was normal by echo. 3.  Right heart catheterization showed normal filling pressures, moderate pulmonary hypertension and normal cardiac output. RA: 5 mmHg RV: 47/2 mmHg PW: 13 mmHg PA: 54/20 with a mean of 29 mmHg Cardiac output is 4.32 with an index of 2.36. Recommendations: Continue medical therapy. Will switch IV furosemide back to her dose of oral torsemide. Eliquis to be resumed tonight.  Continue treatment for pulmonary hypertension.    Assessment and Plan  Debra Saunders is a 78 y.o. 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  Patient's in device was interrogated in the clinic she was found to be in a fib RVR a few times. The recommended she be admitted to the hospital for evaluation consider cardioversion.  High-sensitivity troponin 24-- 25-- 29. BNP was 335. Patient was started on IV Lasix and has put out good amount of urine. She is overall feeling a lot better. She recently had battery change in her pacemaker. She takes eliquis.  Congestive heart failure acute on chronic diastolic -- came in with chest tightness and shortness of breath with elevated BNP 335 -- no acute chest x-ray findings but clinically feels short of breath --Currently on Lasix IV 40 mg BID. Monitor creatinine and sodium and adjust Lasix accordingly -- echo pending --cont farxiga, spironolactone --1/27; cardiac cath 1.  Near normal coronary arteries. 2.  Left ventricular angiography was not performed.  EF was normal by echo. 3.  Right heart catheterization showed normal filling pressures, moderate pulmonary hypertension and normal cardiac output.  Recommendations: -Continue medical therapy. --Will switch IV furosemide back to her dose of oral torsemide. --Eliquis to be resumed tonight. --Continue treatment for pulmonary hypertension.  Chest pain with mildly elevated troponin -- per cardiology plan for cath on Monday -- hold eliquis and  started heparin by Dr. Duke Salvia --post cath-- per Dr. Kirke Corin discontinue heparin some resume eliquis from tonight  Persistent a fib -- patient's device was interrogated and noted to be in a fib few times. -- Plan for DC CV after cath per [facility] cardiology-- Dr. Kirke Corin to discuss with EP cardiology and inform of the plan  Pulmonary hypertension obstructive sleep apnea --cont selexipag and sildenafil --?opsumit -- CPAP nightly  Chronic endocarditis with lead vegetation -- not a candidate for pacemaker extraction -- continue chronic PO prophylactic antibiotic  History of orthostatic hypotension --midodrine    Procedures:R/H cath on 1/27 Family communication :none Consults : [facility] cardiology CODE STATUS: full DVT Prophylaxis : heparin drip Level of care: Telemetry Medical Status is: Inpatient Remains inpatient appropriate because: chf, afib, needs cath and DCCV on monday    TOTAL TIME TAKING CARE OF THIS PATIENT: 35 minutes.  >50% time spent on counselling and coordination of care  Note: This dictation was prepared with Dragon dictation along with smaller phrase technology. Any transcriptional errors that result from this process are unintentional.  Enedina Finner M.D    [facility]   CC: Primary care physician; Glori Luis, MD

## 2023-07-20 NOTE — OR Nursing (Signed)
Bp cuff moved to right upper arm from right ankle, SBP improved  from 170's to 152 on right arm

## 2023-07-20 NOTE — TOC CM/SW Note (Signed)
Transition of Care [facility]) - Inpatient Brief Assessment   Patient Details  Name: Debra Saunders MRN: [id number] Date of Birth: Aug 31, 1945  Transition [facility]) CM/SW Contact:    Margarito Liner, LCSW Phone Number: 07/20/2023, 2:34 PM   Clinical Narrative: CSW reviewed chart. No TOC needs identified so far. CSW will continue to follow progress. Please place [facility] consult if any needs arise.  Transition of Care Asessment: Insurance and Status: Insurance coverage has been reviewed Patient has primary care physician: Yes Home environment has been reviewed: Single family home Prior level of function:: Not documented Prior/Current Home Services: No current home services Social Drivers of Health Review: SDOH reviewed interventions complete Readmission risk has been reviewed: Yes Transition of care needs: no transition of care needs at this time

## 2023-07-21 ENCOUNTER — Telehealth: Payer: Self-pay

## 2023-07-21 ENCOUNTER — Other Ambulatory Visit: Payer: Self-pay

## 2023-07-21 ENCOUNTER — Other Ambulatory Visit (HOSPITAL_COMMUNITY): Payer: Self-pay

## 2023-07-21 ENCOUNTER — Telehealth (HOSPITAL_COMMUNITY): Payer: Self-pay | Admitting: Pharmacy Technician

## 2023-07-21 DIAGNOSIS — I4819 Other persistent atrial fibrillation: Secondary | ICD-10-CM | POA: Diagnosis not present

## 2023-07-21 DIAGNOSIS — I5033 Acute on chronic diastolic (congestive) heart failure: Secondary | ICD-10-CM | POA: Diagnosis not present

## 2023-07-21 LAB — BASIC METABOLIC PANEL
Anion gap: 8 (ref 5–15)
BUN: 10 mg/dL (ref 8–23)
CO2: 29 mmol/L (ref 22–32)
Calcium: 9.1 mg/dL (ref 8.9–10.3)
Chloride: 103 mmol/L (ref 98–111)
Creatinine, Ser: 0.68 mg/dL (ref 0.44–1.00)
GFR, Estimated: >60 mL/min (ref >60)
Glucose, Bld: 92 mg/dL (ref 70–99)
Potassium: 3.9 mmol/L (ref 3.5–5.1)
Sodium: 140 mmol/L (ref 135–145)

## 2023-07-21 LAB — GLUCOSE, CAPILLARY
Glucose-Capillary: 86 mg/dL (ref 70–99)
Glucose-Capillary: 117 mg/dL — ABNORMAL HIGH (ref 70–99)

## 2023-07-21 LAB — MAGNESIUM: Magnesium: 2.1 mg/dL (ref 1.7–2.4)

## 2023-07-21 LAB — PHOSPHORUS: Phosphorus: 3 mg/dL (ref 2.5–4.6)

## 2023-07-21 MED ORDER — DAPAGLIFLOZIN PROPANEDIOL 10 MG PO TABS
10.0000 mg | ORAL_TABLET | Freq: Every day | ORAL | 1 refills | Status: DC
  Filled 2023-07-21: qty 90, 90d supply, fill #0

## 2023-07-21 MED ORDER — POTASSIUM CHLORIDE CRYS ER 20 MEQ PO TBCR: EXTENDED_RELEASE_TABLET | ORAL | 1 refills | Status: DC

## 2023-07-21 MED ORDER — POTASSIUM CHLORIDE CRYS ER 20 MEQ PO TBCR
EXTENDED_RELEASE_TABLET | ORAL | 0 refills | Status: DC
  Filled 2023-07-21: qty 90, 30d supply, fill #0

## 2023-07-21 NOTE — Plan of Care (Signed)
  Problem: Coping: Goal: Ability to adjust to condition or change in health will improve Outcome: Progressing   Problem: Fluid Volume: Goal: Ability to maintain a balanced intake and output will improve Outcome: Progressing   Problem: Health Behavior/Discharge Planning: Goal: Ability to manage health-related needs will improve Outcome: Progressing   Problem: Metabolic: Goal: Ability to maintain appropriate glucose levels will improve Outcome: Progressing   Problem: Education: Goal: Knowledge of General Education information will improve Description: Including pain rating scale, medication(s)/side effects and non-pharmacologic comfort measures Outcome: Progressing   Problem: Clinical Measurements: Goal: Will remain free from infection Outcome: Progressing   Problem: Elimination: Goal: Will not experience complications related to urinary retention Outcome: Progressing   Problem: Pain Managment: Goal: General experience of comfort will improve and/or be controlled Outcome: Progressing   Problem: Activity: Goal: Risk for activity intolerance will decrease Outcome: Not Progressing

## 2023-07-21 NOTE — Evaluation (Signed)
Physical Therapy Evaluation Patient Details Name: Debra Saunders MRN: [id number] DOB: Jun 20, 1946 Today's Date: 07/21/2023  History of Present Illness  Pt is an 78 y.o. female presenting to hospital 07/17/23 with c/o chest discomfort and SOB.  Pt admitted with acute on chronic diastolic CHF, hypokalemia.  S/p cardiac cath 07/20/23.  PMH includes CHF, interstitial lung disease, CAD, a-fib, htn, DM, OSA, SAH, pacemaker, endocarditis with lead vegetation (not a candidate for extraction), orthostatic hypotension, CVA, chronic lymphedema, DVT s/p IVC filter.  4 L home O2 chronic.  Clinical Impression  Prior to recent medical concerns, pt reports being modified independent ambulating household distances with rollator; has 1 assist for stairs in/out of home; uses 4 L chronic O2; lives alone in 1 level home; has family support.  Currently pt is modified independent with bed mobility; SBA with transfers; and CGA to ambulate 100 feet with RW use.  SOB noted with activity/ambulation (SpO2 sats 90% or greater on 4 L O2 via nasal cannula during sessions activities).  During ambulation, pt reporting feeling really dizzy but would not stop walking (despite therapist asking pt to turn around d/t safety concerns/wanted to be closer to a sitting surface); pt ended up needing to sit in a chair d/t significant dizziness. Pt reports she has h/o dizziness when she stands/walks and typically takes Meclizine for it but hasn't had it today. Pt reports laying in bed for the past 3 days probably did not help. Pt's BP was 161/70 after laying back down in bed. Pt moves fairly well using walker but limited d/t above symptoms.  Pt reports she also uses rollator and can sit down when she needs to typically.  MD and pt's nurse updated on above concerns.  Pt reports she wants to discharge home.  Educated pt on PT recommendations including recommendation for 24/7 assist initially (d/t dizziness concerns): pt verbalizing understanding.  Pt would  currently benefit from skilled PT to address noted impairments and functional limitations (see below for any additional details).  Upon hospital discharge, pt would benefit from ongoing therapy.     If plan is discharge home, recommend the following: A little help with walking and/or transfers;A little help with bathing/dressing/bathroom;Assistance with cooking/housework;Assist for transportation;Help with stairs or ramp for entrance   Can travel by private vehicle        Equipment Recommendations  (pt has rollator at home already)  Recommendations for Other Services       Functional Status Assessment Patient has had a recent decline in their functional status and demonstrates the ability to make significant improvements in function in a reasonable and predictable amount of time.     Precautions / Restrictions Precautions Precautions: Fall Restrictions Other Position/Activity Restrictions: Radial cath precautions      Mobility  Bed Mobility Overal bed mobility: Modified Independent             General bed mobility comments: Semi-supine to/from sitting (HOB mildly elevated)    Transfers Overall transfer level: Needs assistance Equipment used: Rolling walker (2 wheels) Transfers: Sit to/from Stand, Bed to chair/wheelchair/BSC Sit to Stand: Supervision   Step pivot transfers: Supervision (chair to [facility])       General transfer comment: sit to/from stand (x1 trial from bed; x1 trial from chair; x1 trial from [facility])    Ambulation/Gait Ambulation/Gait assistance: Contact guard assist Gait Distance (Feet): 100 Feet Assistive device: Rolling walker (2 wheels) Gait Pattern/deviations: Step-through pattern       General Gait Details: steady ambulating with RW  use; limited distance ambulating d/t c/o significant dizziness (pt reports h/o this and normally takes Meclizine for it but has not had it today)  Stairs Stairs:  (deferred d/t significant dizzines with  ambulation)          Wheelchair Mobility     Tilt Bed    Modified Rankin (Stroke Patients Only)       Balance Overall balance assessment: Needs assistance Sitting-balance support: No upper extremity supported, Feet supported Sitting balance-Leahy Scale: Normal Sitting balance - Comments: steady reaching outside BOS   Standing balance support: Bilateral upper extremity supported, Reliant on assistive device for balance Standing balance-Leahy Scale: Good Standing balance comment: steady ambulating with walker use                             Pertinent Vitals/Pain Pain Assessment Pain Assessment: Faces Faces Pain Scale: Hurts a little bit Pain Location: R elbow Pain Descriptors / Indicators: Sore Pain Intervention(s): Limited activity within patient's tolerance, Monitored during session HR 73-84 bpm during sessions activities.    Home Living Family/patient expects to be discharged to:: Private residence Living Arrangements: Alone Available Help at Discharge: Family;Available PRN/intermittently Type of Home: House Home Access: Ramped entrance;Stairs to enter (typically uses stairs d/t ramp further distance to walk) Entrance Stairs-Rails: Right;Left;Can reach both Entrance Stairs-Number of Steps: 4   Home Layout: One level Home Equipment: Rollator (4 wheels) Additional Comments: Pt's daughter works from home and lives 10 minutes away; pt reports talking to her daughter daily and she comes to assist as needed.  Pt's grandson also works from home and will work 1 day per week from [employer] home.    Prior Function Prior Level of Function : Independent/Modified Independent             Mobility Comments: Modified independent ambulating with rollator household distances.  Uses 4 L home O2 chronic.  Pt denies any recent falls. ADLs Comments: Pt's daughter assists with household chores, errands, etc as needed.     Extremity/Trunk Assessment   Upper Extremity  Assessment Upper Extremity Assessment: Overall WFL for tasks assessed    Lower Extremity Assessment Lower Extremity Assessment: Generalized weakness    Cervical / Trunk Assessment Cervical / Trunk Assessment: Normal  Communication   Communication Communication: No apparent difficulties Cueing Techniques: Verbal cues  Cognition Arousal: Alert Behavior During Therapy: WFL for tasks assessed/performed Overall Cognitive Status: Within Functional Limits for tasks assessed                                          General Comments  Nursing cleared pt for participation in physical therapy.  Pt agreeable to PT session.    Exercises     Assessment/Plan    PT Assessment Patient needs continued PT services  PT Problem List Decreased strength;Decreased activity tolerance;Decreased mobility       PT Treatment Interventions DME instruction;Gait training;Functional mobility training;Therapeutic activities;Stair training;Therapeutic exercise;Balance training;Patient/family education    PT Goals (Current goals can be found in the Care Plan section)  Acute Rehab PT Goals Patient Stated Goal: to go home PT Goal Formulation: With patient Time For Goal Achievement: 08/04/23 Potential to Achieve Goals: Good    Frequency Min 1X/week     Co-evaluation               AM-PAC PT "6 Clicks"  Mobility  Outcome Measure Help needed turning from your back to your side while in a flat bed without using bedrails?: None Help needed moving from lying on your back to sitting on the side of a flat bed without using bedrails?: None Help needed moving to and from a bed to a chair (including a wheelchair)?: A Little Help needed standing up from a chair using your arms (e.g., wheelchair or bedside chair)?: A Little Help needed to walk in hospital room?: A Little Help needed climbing 3-5 steps with a railing? : A Little 6 Click Score: 20    End of Session Equipment Utilized During  Treatment: Gait belt Activity Tolerance: Other (comment) (Limited d/t c/o significant dizziness with ambulation) Patient left: in bed;with call bell/phone within reach;with bed alarm set;with nursing/sitter in room Nurse Communication: Mobility status;Precautions;Other (comment) (Pt's symptoms during session) PT Visit Diagnosis: Other abnormalities of gait and mobility (R26.89);Muscle weakness (generalized) (M62.81)    Time: 1128-1208 PT Time Calculation (min) (ACUTE ONLY): 40 min   Charges:   PT Evaluation $PT Eval Low Complexity: 1 Low PT Treatments $Therapeutic Activity: 23-37 mins PT General Charges $$ ACUTE PT VISIT: 1 Visit        Hendricks Limes, PT 07/21/23, 2:13 PM

## 2023-07-21 NOTE — Progress Notes (Signed)
Rounding Note    Patient Name: Debra Saunders Date of Encounter: 07/21/2023  [facility] Cardiologist: [name], MD   Subjective   Patient reports that she is feeling much better today. Her breathing has improved greatly although she does not feel quite back to her baseline. She remains on 4 L supplemental O2. R/LHC done yesterday. Cath site is stable.   Inpatient Medications    Scheduled Meds:  apixaban  5 mg Oral BID   atropine  1 drop Both Eyes QHS   cefadroxil  500 mg Oral BID   cholestyramine  1 packet Oral Daily   dapagliflozin propanediol  10 mg Oral QAC breakfast   gabapentin  600 mg Oral QHS   insulin aspart  0-9 Units Subcutaneous TID WC   potassium chloride  40 mEq Oral Q breakfast   And   potassium chloride  20 mEq Oral QPM   rosuvastatin  10 mg Oral Daily   Selexipag  1,600 mcg Oral BID   sildenafil  20 mg Oral TID   sodium chloride flush  3 mL Intravenous Q12H   spironolactone  25 mg Oral Daily   torsemide  40 mg Oral Daily   Continuous Infusions:  PRN Meds: acetaminophen **OR** acetaminophen, albuterol, loperamide, magnesium hydroxide, meclizine, ondansetron **OR** ondansetron (ZOFRAN) IV, oxyCODONE, sodium chloride flush, traZODone   Vital Signs    Vitals:   07/21/23 0350 07/21/23 0351 07/21/23 0600 07/21/23 0749  BP:    (!) 161/66  Pulse:    (!) 55  Resp: 16  14   Temp:    98.6 F (37 C)  TempSrc:    Oral  SpO2:    99%  Weight:  87.5 kg    Height:        Intake/Output Summary (Last 24 hours) at 07/21/2023 1120 Last data filed at 07/21/2023 1043 Gross per 24 hour  Intake 120 ml  Output 650 ml  Net -530 ml      07/21/2023    3:51 AM 07/20/2023    5:15 AM 07/17/2023    3:32 PM  Last 3 Weights  Weight (lbs) 192 lb 14.4 oz 191 lb 5.8 oz 196 lb 3.4 oz  Weight (kg) 87.5 kg 86.8 kg 89 kg      Telemetry    Atrial fibrillation with intermittent ventricular pacing, rate 55-75 bpm - Personally Reviewed  Physical Exam    GEN: No acute distress.   Neck: No JVD Cardiac: RRR, no murmurs, rubs, or gallops. Cath site stable.  Respiratory: Clear to auscultation bilaterally. GI: Soft, nontender, non-distended  MS: Chronic left lower extremity edema; No deformity. Neuro:  Nonfocal  Psych: Normal affect   Labs    High Sensitivity Troponin:   Recent Labs  Lab 07/17/23 1536 07/17/23 1734 07/18/23 0630 07/18/23 1502  TROPONINIHS 24* 25* 29* 32*     Chemistry Recent Labs  Lab 07/18/23 0630 07/19/23 0604 07/20/23 0338 07/20/23 0944 07/20/23 0954 07/21/23 0837  NA 146* 139 141 140 141 140  K 2.7* 3.0* 3.6 3.5 3.5 3.9  CL 102 102 100  --   --  103  CO2 29 28 30   --   --  29  GLUCOSE 112* 91 105*  --   --  92  BUN 12 10 16   --   --  10  CREATININE 0.78 0.73 0.88  --   --  0.68  CALCIUM 8.9 8.2* 8.9  --   --  9.1  MG  2.1  --  2.1  --   --  2.1  GFRNONAA >60 >60 >60  --   --  >60  ANIONGAP 15 9 11   --   --  8    Lipids No results for input(s): "CHOL", "TRIG", "HDL", "LABVLDL", "LDLCALC", "CHOLHDL" in the last 168 hours.  Hematology Recent Labs  Lab 07/17/23 1536 07/18/23 0630 07/20/23 0338 07/20/23 0944 07/20/23 0954  WBC 3.9* 4.4 5.5  --   --   RBC 3.95 3.78* 4.00  --   --   HGB 10.3* 9.8* 10.2* 11.2* 11.2*  HCT 34.8* 33.5* 34.3* 33.0* 33.0*  MCV 88.1 88.6 85.8  --   --   MCH 26.1 25.9* 25.5*  --   --   MCHC 29.6* 29.3* 29.7*  --   --   RDW 14.0 14.1 14.1  --   --   PLT 174 176 206  --   --    Thyroid No results for input(s): "TSH", "FREET4" in the last 168 hours.  BNP Recent Labs  Lab 07/17/23 1536  BNP 335.5*    DDimer No results for input(s): "DDIMER" in the last 168 hours.   Radiology    CARDIAC CATHETERIZATION Result Date: 07/20/2023 1.  Near normal coronary arteries. 2.  Left ventricular angiography was not performed.  EF was normal by echo. 3.  Right heart catheterization showed normal filling pressures, moderate pulmonary hypertension and normal cardiac output. RA: 5  mmHg RV: 47/2 mmHg PW: 13 mmHg PA: 54/20 with a mean of 29 mmHg Cardiac output is 4.32 with an index of 2.36. Recommendations: Continue medical therapy. Will switch IV furosemide back to her dose of oral torsemide. Eliquis to be resumed tonight. Continue treatment for pulmonary hypertension.    Cardiac Studies   07/21/2023 R/LHC 1.  Near normal coronary arteries. 2.  Left ventricular angiography was not performed.  EF was normal by echo. 3.  Right heart catheterization showed normal filling pressures, moderate pulmonary hypertension and normal cardiac output. RA: 5 mmHg RV: 47/2 mmHg PW: 13 mmHg PA: 54/20 with a mean of 29 mmHg Cardiac output is 4.32 with an index of 2.36.  Patient Profile   78 y.o. 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  Assessment & Plan    Acute on chronic HFpEF - Presented with worsening shortness of breath and chest discomfort - BNP 335 - Improved with IV diuresis with net output since admission -3 L, appears euvolemic on exam. Appears to be at dry weight.  - R/LHC 1/27 showed normal filling pressures, moderate pulmonary hypertension and normal cardiac output - Continue Farxiga 10 mg daily, spironolactone 25 mg daily, and torsemide 40 mg daily - Plan for close outpatient follow up with advanced heart failure clinic  Chest pain Mildly elevated troponin - Troponin peaked at 32 on 1/25 - Suspect demand ischemia - [facility] 1/27 showed near normal coronary arteries - Chest pain now resolved - Continue rosuvastatin 10 mg  Persistent atrial fibrillation - History  of multiple cardioversions in the past which was successful but most of the symptoms were in the setting of treatment of amiodarone. Amiodarone used cautiously in the past  due to concerns for lung toxicity.  - Patient is not likely to maintain sinus rhythms without an antiarrhythmic medication, therefore would not benefit from DCCV at this time - Telemetry shows rate controlled atrial fibrillation, 55-75 bpm - CHA2DS2VASc 9 - Continue Eliquis 5 mg BID - Plan for outpatient follow up with EP  Orthostatic hypotension - BP labile - Will continue to monitor  Pulmonary arterial hypertension - Continue selexipag and sildenafil - Continue with chronic oxygen and CPAP at bedtime  For questions or updates, please contact [facility] Please consult [email or web address] for contact info under        Signed, Orion Crook, PA-C  07/21/2023, 11:20 AM

## 2023-07-21 NOTE — Discharge Summary (Signed)
Physician Discharge Summary   Patient: Debra Saunders MRN: [id number] DOB: 09/01/1945  Admit date:     07/17/2023  Discharge date: 07/21/23  Discharge Physician: Enedina Finner   PCP: Glori Luis, MD   Recommendations at discharge:    F/u Dr Shirlee Latch with CHF clinic F/u EP dr Graciela Husbands on your appt F/u PCP in 1-2 weeks [facility] arranged  Discharge Diagnoses: Principal Problem:   Acute on chronic diastolic (congestive) heart failure [facility]) Active Problems:   Acute hypokalemia   Interstitial lung disease (HCC)   Pulmonary HTN (HCC)   Nonspecific chest pain   Chronic atrial fibrillation with RVR (HCC)   Dyslipidemia   Non-ST elevation (NSTEMI) myocardial infarction [facility])  CARDIAC CATHETERIZATION Result Date: 07/20/2023 1.  Near normal coronary arteries. 2.  Left ventricular angiography was not performed.  EF was normal by echo. 3.  Right heart catheterization showed normal filling pressures, moderate pulmonary hypertension and normal cardiac output. RA: 5 mmHg RV: 47/2 mmHg PW: 13 mmHg PA: 54/20 with a mean of 29 mmHg Cardiac output is 4.32 with an index of 2.36. Recommendations: Continue medical therapy. Will switch IV furosemide back to her dose of oral torsemide. Eliquis to be resumed tonight. Continue treatment for pulmonary hypertension.       Assessment and Plan   Debra Saunders is a 78 y.o. 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   Patient's in device was interrogated in the clinic she was found to be in a fib RVR a few  times. The recommended she be admitted to the hospital for evaluation consider cardioversion.   High-sensitivity troponin 24-- 25-- 29. BNP was 335. Patient was started on IV Lasix and has put out good amount of urine. She is overall feeling a lot better. She recently had battery change in her pacemaker. She takes eliquis.   Congestive heart failure acute on chronic diastolic -- came in with chest tightness and shortness of breath with elevated BNP 335 -- no acute chest x-ray findings but clinically feels short of breath --Currently on Lasix IV 40 mg BID. Monitor creatinine and sodium and adjust Lasix accordingly -- echo pending --cont farxiga, spironolactone --1/27; cardiac cath 1.  Near normal coronary arteries. 2.  Left ventricular angiography was not performed.  EF was normal by echo. 3.  Right heart catheterization showed normal filling pressures, moderate pulmonary hypertension and normal cardiac output.  Recommendations: -Continue medical therapy. --Will switch IV furosemide back to her dose of oral torsemide. --Eliquis to be resumed tonight. --Continue treatment for pulmonary hypertension. -- Patient overall at baseline. Has some chronic exertional shortness of breath. Okay from cardiology standpoint to discharge on current meds.d/w Dr End   Chest pain with mildly elevated troponin -- per cardiology plan for cath on Monday -- hold eliquis and started heparin by Dr. Duke Salvia --post cath-- per Dr. Kirke Corin discontinue heparin some resume eliquis from tonight   Persistent a fib -- patient's device was interrogated and noted to be in a fib few times. -- Plan for DC CV will be reviewed as outpatient by Dr. Graciela Husbands   Pulmonary hypertension obstructive sleep apnea --  cont selexipag and sildenafil and opsumit -- CPAP nightly   Chronic endocarditis with lead vegetation -- not a candidate for pacemaker extraction -- continue chronic PO prophylactic antibiotic   History of orthostatic  hypotension --stable  Patient ambulated with physical therapy. She overall did well. She has chronic dizziness takes meclizine PRN. Patient is eager to go home. Discussed with patient's daughter on the phone. Will arrange home health PT. She'll follow-up with [facility] cardiology heart failure and electrophysiologist on her appointments. Patient voice understanding and is okay with plan       Procedures:R/H cath on 1/27 Family communication :none Consults : [facility] cardiology CODE STATUS: full      Disposition: Home health Diet recommendation:  Discharge Diet Orders (From admission, onward)     Start     Ordered   07/21/23 0000  Diet - low sodium heart healthy        07/21/23 1229           Cardiac diet DISCHARGE MEDICATION: Allergies as of 07/21/2023       Reactions   Aspirin Anaphylaxis, Hives   Pt states that she is unable to take because she had bleeding in her brain   Other Other (See Comments)   Pt states that she is unable to take blood thinners because she had bleeding in her brain; Blood Thinners-per doctor at [facility] (Xarelto)        Medication List     STOP taking these medications    cholestyramine 4 g packet Commonly known as: QUESTRAN   midodrine 5 MG tablet Commonly known as: PROAMATINE       TAKE these medications    acetaminophen 325 MG tablet Commonly known as: TYLENOL Take 325-650 mg by mouth every 6 (six) hours as needed for mild pain.   albuterol 0.63 MG/3ML nebulizer solution Commonly known as: ACCUNEB Take 1 ampule by nebulization every 6 (six) hours as needed for wheezing.   apixaban 5 MG Tabs tablet Commonly known as: Eliquis Take 1 tablet (5 mg total) by mouth 2 (two) times daily. RESUME SUNDAY pm   atropine 1 % ophthalmic solution Place 1 drop into both eyes at bedtime.   BIOFREEZE EX Apply 1 application topically 4 (four) times daily as needed (for pain).   cefadroxil 500 MG capsule Commonly known as: DURICEF Take 1  capsule (500 mg total) by mouth 2 (two) times daily.   Farxiga 10 MG Tabs tablet Generic drug: dapagliflozin propanediol Take 1 tablet (10 mg total) by mouth daily before breakfast. Start taking on: July 22, 2023   gabapentin 600 MG tablet Commonly known as: NEURONTIN Take 600 mg by mouth at bedtime.   lidocaine 5 % Commonly known as: LIDODERM Place 2 patches onto the skin daily. Remove & Discard patch within 12 hours or as directed by MD   macitentan 10 MG tablet Commonly known as: OPSUMIT Take 1 tablet (10 mg total) by mouth daily.   meclizine 25 MG tablet Commonly known as: ANTIVERT Take 1 tablet (25 mg total) by mouth 3 (three) times daily as needed for dizziness.   oxyCODONE 5 MG immediate release tablet Commonly known as: Oxy IR/ROXICODONE Take 1 tablet (5 mg total) by mouth every 4 (four) hours as needed for moderate pain, breakthrough pain or severe pain.   potassium chloride SA 20 MEQ tablet Commonly known as: KLOR-CON M Take every morning and 20mg  every evening.   rosuvastatin 10 MG tablet Commonly known as: CRESTOR  Take 1 tablet by mouth once daily   sildenafil 20 MG tablet Commonly known as: REVATIO Take 1 tablet (20 mg total) by mouth 3 (three) times daily.   spironolactone 25 MG tablet Commonly known as: ALDACTONE Take 1 tablet by mouth daily.   torsemide 20 MG tablet Commonly known as: DEMADEX Take 40 mg by mouth daily.   Uptravi 1600 MCG Tabs Generic drug: Selexipag Take 1 tablet (1,600 mcg total) by mouth 2 (two) times daily.        Follow-up Information     Glori Luis, MD. Schedule an appointment as soon as possible for a visit in 1 week(s).   Specialty: Family Medicine Contact information: [address] [place] [address] [phone]         Laurey Morale, MD. Schedule an appointment as soon as possible for a visit in 10 day(s).   Specialty: Cardiology Why: for CHF Contact information: [address] [place] [address] [place] [address] [phone]         Duke Salvia, MD. Go to.   Specialty: Cardiology Why: on your next appt. please call if you do not have appt Contact information: [address] [place] [address] [phone]                Discharge Exam: Ceasar Mons Weights   07/17/23 1532 07/20/23 0515 07/21/23 0351  Weight: 89 kg 86.8 kg 87.5 kg   GENERAL:  78 y.o.-year-old patient with no acute distress.  LUNGS: decreased breath sounds bilaterally, no wheezing CARDIOVASCULAR: S1, S2 normal. No murmur   ABDOMEN: Soft, nontender, nondistended. Bowel sounds present.     NEUROLOGIC: nonfocal  patient is alert and awake  Condition at discharge: fair  The results of significant diagnostics from this hospitalization (including imaging, microbiology, ancillary and laboratory) are listed below for reference.   Imaging Studies: CARDIAC CATHETERIZATION Result Date: 07/20/2023 1.  Near normal coronary arteries. 2.  Left ventricular angiography was not performed.  EF was normal by echo. 3.  Right heart catheterization showed normal filling pressures, moderate pulmonary hypertension and normal cardiac output. RA: 5 mmHg RV: 47/2 mmHg PW: 13 mmHg PA: 54/20 with a mean of 29 mmHg Cardiac output is 4.32 with an index of 2.36. Recommendations: Continue medical therapy. Will switch IV furosemide back to her dose of oral torsemide. Eliquis to be resumed tonight. Continue treatment for pulmonary hypertension.   CUP PACEART INCLINIC DEVICE CHECK Result Date: 07/17/2023 Normal in-clinic Pacemaker check. Thresholds, sensing, and impedance WNL or stable for patient over time. Persis AFib since gen change with elevated Ventricular rates. Estimated longevity __13.3 years__. Pt enrolled in remote follow-up. Percell Belt, NP  DG Chest 2 View Result Date: 07/17/2023 CLINICAL DATA:  Shortness of breath.  Chest pain. EXAM: CHEST - 2 VIEW COMPARISON:   10/24/2022. FINDINGS: Low lung volume. Redemonstration of increased interstitial markings throughout bilateral lungs, which corresponds to chronic interstitial lung disease on the prior CT scan. No acute consolidation or lung collapse. Bilateral costophrenic angles are clear. Stable moderately enlarged cardio-mediastinal silhouette. There is a left sided 2-lead pacemaker. No acute osseous abnormalities. The soft tissues are within normal limits. IMPRESSION: *No active cardiopulmonary disease. Chronic interstitial lung disease. Electronically Signed   By: Jules Schick M.D.   On: 07/17/2023 16:23    Microbiology: Results for orders placed or performed during the hospital encounter of 08/21/21  Resp Panel by RT-PCR (Flu A&B, Covid) Nasopharyngeal Swab  Status: Abnormal   Collection Time: 08/21/21  1:37 PM   Specimen: Nasopharyngeal Swab; Nasopharyngeal(NP) swabs in vial transport medium  Result Value Ref Range Status   SARS Coronavirus 2 by RT PCR POSITIVE (A) NEGATIVE Final    Comment: (NOTE) SARS-CoV-2 target nucleic acids are DETECTED.  The SARS-CoV-2 RNA is generally detectable in upper respiratory specimens during the acute phase of infection. Positive results are indicative of the presence of the identified virus, but do not rule out bacterial infection or co-infection with other pathogens not detected by the test. Clinical correlation with patient history and other diagnostic information is necessary to determine patient infection status. The expected result is Negative.  Fact Sheet for Patients: BloggerCourse.com  Fact Sheet for Healthcare Providers: SeriousBroker.it  This test is not yet approved or cleared by the [place] FDA and  has been authorized for detection and/or diagnosis of SARS-CoV-2 by FDA under an Emergency Use Authorization (EUA).  This EUA will remain in effect (meaning this test can be used) for the  duration of  the COVID-19 declaration under Section 564(b)(1) of the A ct, 21 U.S.C. section 360bbb-3(b)(1), unless the authorization is terminated or revoked sooner.     Influenza A by PCR NEGATIVE NEGATIVE Final   Influenza B by PCR NEGATIVE NEGATIVE Final    Comment: (NOTE) The Xpert Xpress SARS-CoV-2/FLU/RSV plus assay is intended as an aid in the diagnosis of influenza from Nasopharyngeal swab specimens and should not be used as a sole basis for treatment. Nasal washings and aspirates are unacceptable for Xpert Xpress SARS-CoV-2/FLU/RSV testing.  Fact Sheet for Patients: BloggerCourse.com  Fact Sheet for Healthcare Providers: SeriousBroker.it  This test is not yet approved or cleared by the [place] FDA and has been authorized for detection and/or diagnosis of SARS-CoV-2 by FDA under an Emergency Use Authorization (EUA). This EUA will remain in effect (meaning this test can be used) for the duration of the COVID-19 declaration under Section 564(b)(1) of the Act, 21 U.S.C. section 360bbb-3(b)(1), unless the authorization is terminated or revoked.  Performed at [facility], [address] [place], [place] [address]   Culture, blood (Routine X 2) w Reflex to ID Panel     Status: None   Collection Time: 08/21/21  3:15 PM   Specimen: BLOOD  Result Value Ref Range Status   Specimen Description BLOOD LEFT ANTECUBITAL  Final   Special Requests BOTTLES DRAWN AEROBIC AND ANAEROBIC BCAV  Final   Culture   Final    NO GROWTH 5 DAYS Performed at [facility], [address], [place], [place] [address]    Report Status 08/26/2021 FINAL  Final  Culture, blood (Routine X 2) w Reflex to ID Panel     Status: None   Collection Time: 08/21/21  3:20 PM   Specimen: BLOOD  Result Value Ref Range Status   Specimen Description BLOOD RIGHT ANTECUBITAL  Final   Special Requests BOTTLES DRAWN AEROBIC AND ANAEROBIC  BCAV  Final   Culture   Final    NO GROWTH 5 DAYS Performed at [facility], [address] [place], [place] [address]    Report Status 08/26/2021 FINAL  Final  MRSA Next Gen by PCR, Nasal     Status: None   Collection Time: 08/26/21  8:48 PM   Specimen: Nasal Mucosa; Nasal Swab  Result Value Ref Range Status   MRSA by PCR Next Gen NOT DETECTED NOT DETECTED Final    Comment: (NOTE) The GeneXpert MRSA  Assay (FDA approved for NASAL specimens only), is one component of a comprehensive MRSA colonization surveillance program. It is not intended to diagnose MRSA infection nor to guide or monitor treatment for MRSA infections. Test performance is not FDA approved in patients less than 22 years old. Performed at [facility], [address] [place], [place] [address]    *Note: Due to a large number of results and/or encounters for the requested time period, some results have not been displayed. A complete set of results can be found in Results Review.    Labs: CBC: Recent Labs  Lab 07/17/23 1536 07/18/23 0630 07/20/23 0338 07/20/23 0944 07/20/23 0954  WBC 3.9* 4.4 5.5  --   --   NEUTROABS 2.3  --   --   --   --   HGB 10.3* 9.8* 10.2* 11.2* 11.2*  HCT 34.8* 33.5* 34.3* 33.0* 33.0*  MCV 88.1 88.6 85.8  --   --   PLT 174 176 206  --   --    Basic Metabolic Panel: Recent Labs  Lab 07/17/23 1536 07/18/23 0630 07/19/23 0604 07/20/23 0338 07/20/23 0944 07/20/23 0954 07/21/23 0837  NA 141 146* 139 141 140 141 140  K 2.5* 2.7* 3.0* 3.6 3.5 3.5 3.9  CL 97* 102 102 100  --   --  103  CO2 31 29 28 30   --   --  29  GLUCOSE 97 112* 91 105*  --   --  92  BUN 15 12 10 16   --   --  10  CREATININE 0.92 0.78 0.73 0.88  --   --  0.68  CALCIUM 8.8* 8.9 8.2* 8.9  --   --  9.1  MG  --  2.1  --  2.1  --   --  2.1  PHOS  --   --   --  2.6  --   --  3.0   Liver Function Tests: No results for input(s): "AST", "ALT", "ALKPHOS", "BILITOT", "PROT", "ALBUMIN" in the last  168 hours. CBG: Recent Labs  Lab 07/20/23 1051 07/20/23 1637 07/20/23 2149 07/21/23 0830 07/21/23 1238  GLUCAP 97 125* 105* 86 117*    Discharge time spent: greater than 30 minutes.  Signed: Enedina Finner, MD [facility] 07/21/2023

## 2023-07-21 NOTE — Telephone Encounter (Signed)
Patient Product/process development scientist completed.    The patient is insured through [employer]. Patient has Medicare and is not eligible for a copay card, but may be able to apply for patient assistance or Medicare RX Payment Plan (Patient Must reach out to their plan, if eligible for payment plan), if available.    Ran test claim for Farxiga 10 mg and the current 30 day co-pay is $0.00.  Ran test claim for Jardiance 10 mg and the current 30 day co-pay is $0.00.  This test claim was processed through [facility]- copay amounts may vary at other pharmacies due to pharmacy/plan contracts, or as the patient moves through the different stages of their insurance plan.     Roland Earl, CPHT Pharmacy Technician III Certified Patient Advocate [facility] Patient Advocate Team Direct Number: [phone]  Fax: [phone]

## 2023-07-21 NOTE — TOC Transition Note (Signed)
Transition of Care [facility]) - Discharge Note   Patient Details  Name: Debra Saunders MRN: [id number] Date of Birth: 1946/06/11  [facility] CM/SW Contact:  Truddie Hidden, RN Phone Number: 07/21/2023, 2:30 PM   Clinical Narrative:    Spoke with patient's daughter, [name], regarding her discharge plan and therapy's recommendation for SNF. She is agreeable to [facility] and was provided choices for [facility]. [name] would like [facility] as this was the last [facility] agency patient used. [name] advised [facility] will contact her directly to scheduled SOC within 48 post discharge.  Referral sent and accepted by [place] from [place].   TOC signing off.            Patient Goals and CMS Choice            Discharge Placement                       Discharge Plan and Services Additional resources added to the After Visit Summary for                                       Social Drivers of Health (SDOH) Interventions SDOH Screenings   Food Insecurity: No Food Insecurity (07/19/2023)  Housing: Low Risk  (07/19/2023)  Transportation Needs: No Transportation Needs (07/19/2023)  Utilities: Not At Risk (07/19/2023)  Alcohol Screen: Low Risk  (03/09/2023)  Depression (PHQ2-9): Low Risk  (03/12/2023)  Recent Concern: Depression (PHQ2-9) - Medium Risk (03/09/2023)  Financial Resource Strain: Low Risk  (03/09/2023)  Physical Activity: Inactive (03/09/2023)  Social Connections: Socially Isolated (07/19/2023)  Stress: No Stress Concern Present (03/09/2023)  Tobacco Use: Medium Risk (07/17/2023)     Readmission Risk Interventions     No data to display

## 2023-07-21 NOTE — Progress Notes (Signed)
Heart Failure Stewardship Pharmacy Note  PCP: Glori Luis, MD PCP-Cardiologist: [name], MD  HPI: Debra Saunders is a 78 y.o. female with  interstitial lung disease, nonobstructive coronary artery disease, atrial fibrillation on Eliquis, combined systolic and diastolic CHF, hypertension, type 2 diabetes mellitus, dyslipidemia, bradycardia s/p PPM [facility]), chronic respiratory failure on home O2, PAH, CKD (required HD in the past), DVT s/p IVC filter in 2004, COPD, traumatic subarachnoid hemorrhage while on xarelto 2014, CVA 2014, chronic lymphedema, and OSA who presented with worsening dyspnea on exertion, chest pain, orthopnea, PND, and lower extremity edema. BNP on admission was 335.5. HS-troponin on admission was 24. Chest XR on admission noted chronic interstitial lung disease.   Pertinent cardiac history: Echo (03/2018): EF 60-65%, PASP 52 mmHg. RHC (09/2017): Severe PAH with PVR 7.2 WU. RHC (03/2018): mean RA 6, PA 56/17 mean 33, PCWP mean 12, CI 3.23, PVR 3.4 WU. RHC (08/2020): mean 3, RV 30/1, PA 33/11, mean 20, PCWP mean 6, Oxygen saturations: PA 69% AO 100%, Cardiac Output (Fick) 6.18, Cardiac Index (Fick) 3.36, and PVR 2.3 WU. V/Q scan (03/2018): No chronic PE. Echo (08/2019): EF 60-65%, normal RV, PASP 50 mmHg, moderate-severe MR. TEE (06/2021): EF 60-65%, mild RV dilation with normal RV systolic function, MR mild-moderate, moderate TR with vegetation on TV and pacemaker lead. Echo (06/2021): EF 60-65%, normal RV, RVSP 51.5 mmHg, mild-moderate MR, moderate TR. Echo (08/2021): EF 60-65%, mild LVH, normal RV, IVC normal, no definite vegetation noted. RHC (01/2022): RA 3, PA 29/10 (mean 16), PCWP 12, CO/CI (Fick) 6.18/3.37. 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  Pertinent Lab Values: Creatinine  Date Value Ref Range Status  03/24/2023 1.12 (H) 0.44 - 1.00 mg/dL Final   Creat   Date Value Ref Range Status  11/25/2021 1.23 (H) 0.60 - 1.00 mg/dL Final   Creatinine, Ser  Date Value Ref Range Status  07/21/2023 0.68 0.44 - 1.00 mg/dL Final   BUN  Date Value Ref Range Status  07/21/2023 10 8 - 23 mg/dL Final  16/03/9603 23 8 - 27 mg/dL Final  54/02/8118 12 7 - 18 mg/dL Final   Potassium  Date Value Ref Range Status  07/21/2023 3.9 3.5 - 5.1 mmol/L Final  12/28/2013 3.2 (L) 3.5 - 5.1 mmol/L Final   Sodium  Date Value Ref Range Status  07/21/2023 140 135 - 145 mmol/L Final  02/03/2023 144 134 - 144 mmol/L Final  12/28/2013 135 (L) 136 - 145 mmol/L Final   Brain Natriuretic Peptide  Date Value Ref Range Status  07/23/2016 38.0 <100 pg/mL Final    Comment:      BNP levels increase with age in the general population with the highest values seen in individuals greater than 76 years of age. Reference: Earlyne Iba Cardiol 2002; 14:782-95.      B Natriuretic Peptide  Date Value Ref Range Status  07/17/2023 335.5 (H) 0.0 - 100.0 pg/mL Final    Comment:    Performed at [facility], [address] [place], [place] [address]   Magnesium  Date Value Ref Range Status  07/21/2023 2.1 1.7 - 2.4 mg/dL Final    Comment:    Performed at [facility], [address] [place], [place] [address]  11/18/2013 1.7 (L) mg/dL Final    Comment:    4.6-9.6 THERAPEUTIC RANGE: 4-7 mg/dL TOXIC: > 10 mg/dL  -----------------------    Hemoglobin  A1C  Date Value Ref Range Status  02/03/2013 6.2 4.2 - 6.3 % Final    Comment:    The American Diabetes Association recommends that a primary goal of therapy should be <7% and that physicians should reevaluate the treatment regimen in patients with HbA1c values consistently >8%.    Hgb A1c MFr Bld  Date Value Ref Range Status  07/18/2023 5.1 4.8 - 5.6 % Final    Comment:    (NOTE) Pre diabetes:          5.7%-6.4%  Diabetes:              >6.4%  Glycemic control for   <7.0% adults with diabetes     Digoxin Level  Date Value Ref Range Status  08/26/2021 1.5 0.8 - 2.0 ng/mL Final    Comment:    Performed at [facility], [address] [place], [place] [address]   TSH  Date Value Ref Range Status  01/28/2022 0.397 0.350 - 4.500 uIU/mL Final    Comment:    Performed by a 3rd Generation assay with a functional sensitivity of <=0.01 uIU/mL. Performed at [facility], [address] [place], [place] [address]   08/29/2021 0.763 0.450 - 4.500 uIU/mL Final   LDH  Date Value Ref Range Status  06/25/2021 184 98 - 192 U/L Final    Comment:    Performed at [name], [address] [place], [place] [address]    Vital Signs: Admission weight: Temp:  [97.6 F (36.4 C)-98.7 F (37.1 C)] 98.6 F (37 C) (01/28 0749) Pulse Rate:  [54-58] 55 (01/28 0749) Cardiac Rhythm: Ventricular paced (01/28 0700) Resp:  [14-20] 14 (01/28 0600) BP: (120-161)/(54-71) 161/66 (01/28 0749) SpO2:  [96 %-100 %] 99 % (01/28 0749) FiO2 (%):  [36 %] 36 % (01/27 2100) Weight:  [87.5 kg (192 lb 14.4 oz)] 87.5 kg (192 lb 14.4 oz) (01/28 0351)  Intake/Output Summary (Last 24 hours) at 07/21/2023 1207 Last data filed at 07/21/2023 1043 Gross per 24 hour  Intake 120 ml  Output 650 ml  Net -530 ml    Current Heart Failure Medications:  Loop diuretic: torsemide 40 mg daily Beta-Blocker: ACEI/ARB/ARNI: MRA: SGLT2i: Farxiga 10 mg daily Other:  Prior to admission Heart Failure Medications:  Loop diuretic: torsemide 40 mg daily Beta-Blocker: ACEI/ARB/ARNI: MRA: spironolactone 25 mg daily SGLT2i: Farxiga 10 mg daily (reported not taking) Other: reported not taking midodrine PAH: Uptravi. Sildenafil, Opsumit  Assessment: 1. Acute on chronic diastolic heart failure (LVEF 60-65%) with grade 1 diastolic dysfunction, due to presumed NICM. NYHA class III symptoms.  -Symptoms: Patient is on 4L baseline O2 at home. Reports shortness of breath has improved significantly, but is  not quite back to normal. Reports persistent left and no right lower extremity edema, which is typical for her. Ambulating the hallway this afternoon with some shortness of breath.  -Volume: Cath yesterday showed RA of 5, PCWP of 13. IV furosemide converted back to home torsemide.  -Hemodynamics: CO/CI of 4.32/2.36. Remains hypertensive after stopping midodrine. Heart rate is ~55. -SGLT2i: Continue Farxiga 10 mg daily -MRA: Continue spironolactone 25 mg daily -ARNI: May consider down the road if BP remains hight.  -RHC this admission with PA pressure of 54/20 mmHg. Last RHC in 08/2020 with PA pressures of 33/11 mmHg. Suspected the difference may be that the patient is not taking Opsumit inpatient. Given PA pressures still high and BP is also high, could consider further titration of sildenafil,  but would defer to CHF MD.    Plan: 1) Medication changes recommended at this time: -None  2) Patient assistance: -Farxiga copay is $0. PAH medications mailed via [facility] specialty pharmacy programs.  3) Education: - Patient has been educated on current HF medications and potential additions to HF medication regimen - Patient verbalizes understanding that over the next few months, these medication doses may change and more medications may be added to optimize HF regimen - Patient has been educated on basic disease state pathophysiology and goals of therapy  Medication Assistance / Insurance Benefits Check: Does the patient have prescription insurance?    Type of insurance plan:  Does the patient qualify for medication assistance through manufacturers or grants? No  Approved medication assistance renewals will be completed by: Dr. Marca Ancona  Outpatient Pharmacy: Prior to admission outpatient pharmacy: [facility]   Is the patient willing to utilize a [facility] pharmacy at discharge?: Yes  Please do not hesitate to reach out with questions or concerns,  Enos Fling, PharmD, CPP, BCPS Heart  Failure Pharmacist  Phone - [phone] 07/21/2023 12:07 PM

## 2023-07-21 NOTE — Telephone Encounter (Signed)
Message sent to scheduling pool  Orion Crook, PA-C  P Cv Div Burl Triage; P Cv Div Burl Scheduling This patient will need hospital follow up with EP (any provider) in 1-2 weeks. Thanks!

## 2023-07-21 NOTE — Care Management Important Message (Signed)
Important Message  Patient Details  Name: Debra Saunders MRN: [id number] Date of Birth: 10-06-1945   Important Message Given:  Yes - Medicare IM     Sherilyn Banker 07/21/2023, 11:01 AM

## 2023-07-21 NOTE — Progress Notes (Signed)
Heart Failure Navigator Progress Note  Assessed for Heart & Vascular TOC clinic readiness.  Patient does not meet criteria due to Advanced Heart Failure Team patient of Dr. Marca Ancona, MD.  Navigator will sign off at this time.   Roxy Horseman, RN, BSN [facility] Heart Failure Navigator Secure Chat Only

## 2023-07-22 ENCOUNTER — Telehealth: Payer: Self-pay | Admitting: *Deleted

## 2023-07-22 NOTE — Telephone Encounter (Signed)
LVM to schedule hospital follow up appt.

## 2023-07-22 NOTE — Patient Instructions (Signed)
Visit Information  Thank you for taking time to visit with me today. Please don't hesitate to contact me if I can be of assistance to you before our next scheduled telephone appointment.  Your next appointment is by telephone on Thursday 07/30/23 at 11:00 am with nurse Thayer Ohm  Please call the care guide team at [phone] if you need to cancel or reschedule your appointment.   Patient Goals/Self-Care Activities: Participate in Transition of Care Program/Attend TOC scheduled calls Take all medications as prescribed Attend all scheduled provider appointments Call provider office for new concerns or questions  Continue using home oxygen as prescribed Use assistive devices (your walker) as needed to prevent falls Eat a heart healthy and low salt diet Please go over all of your medications and any medication question/ concerns with your care providers (doctor) Work with the home health team that is going to call you to schedule a visit to your home after your hospital discharge ([facility]:  [phone])   Following is a copy of your care plan:   Goals Addressed             This Visit's Progress    TOC 30-day Program Care Plan   On track    Current Barriers:  Provider appointments needs HFU appointment with PCP: her current PCP has left practice and she is scheduled for transfer of care with Dr. Clent Ridges on 08/27/23- however- needs prompt HFU with APP or other provider-- care coordination outreach with practice staff to facilitate Home Health services Confirmed ordered through [facility] on 07/21/23- pending at time of [facility] call on 07/22/23 Equipment/DME daughter confirms has rollator walker and home O2 as per baseline prior to admission: denies additional DME needs today Resides alone; supportive daughter/ caregiver active in patient's care, manages all aspects of patient's medical affairs/ phone calls/ appointments; patient essentially independent in self-care at home and self- manages  medications; uses walker "all the time" for ambulation; uses continuous home O2 at 4 L/min via [place] at baseline- prior to recent hospital admission Hospitalized January 26-28, 2025 for Acute on Chronic CHF/ Respiratory Failure  RNCM Clinical Goal(s):  Patient will work with the Care Management team over the next 30 days to address Transition of Care Barriers: Provider appointments Home Health services take all medications exactly as prescribed and will call provider for medication related questions as evidenced by caregiver reporting/ review of same during [facility] 30-day RN CM weekly outreaches attend all scheduled medical appointments: 07/27/23- CHF provider; PCP HFU  as evidenced by review of same with patient's caregiver/ daughter and in EHR during [facility] 30-day RN CM weekly outreaches not experience hospital admission as evidenced by review of EMR. Hospital Admissions in last 6 months = 1  through collaboration with RN Care manager, provider, and care team.   Interventions: Evaluation of current treatment plan related to  self management and patient's adherence to plan as established by provider  Transitions of Care:  New goal. 07/22/23 Durable Medical Equipment (DME) needs assessed with patient/caregiver Doctor Visits  - discussed the importance of doctor visits Communication with PCP scheduling staff re: scheduling of HFU office visit with APP, in light of established PCP no longer being at practice:  patient has current transfer of care appointment scheduled with Dr. Clent Ridges on 08/27/23; scheduling staff at [facility] advise for daughter to call to get HFU OV scheduled with APP Role of home health services with importance of participation/ ongoing engagement: daughter has not yet heard from [facility]: provided phone number for  agency, also provided my direct phone number should issues arise with initiation of services post-TOC call today Attempted medication review: daughter is not currently physically  with patient to review: reports she and patient reviewed medications yesterday at time of hospital discharge; reports patient "understood everything and had not questions, already updated her medications at home;" encouraged caregiver to re-contact me after today's TOC call, if she discovers medication questions/ concerns, post-TOC call today Confirmed uses assistive devices on regular basis, at baseline -- rollator walker Confirms continuing to use home O2 as per hospital discharge instructions: as per baseline prior to admission: 4 L/[place]- daughter reports patient has been using home O2 "for many years- 10-15 years" Reinforced safe use/ maintenance of home O2 Confirmed caregiver aware of/ with plans to attend as scheduled: CHF provider office visit on 07/27/23- daughter confirms she will provide transportation Reinforced/ provided education around basics of following heart healthy low salt diet Successfully enrolled into 30-day TOC program- made TOC 30-day program RN CM aware of successful enrollment today  Patient Goals/Self-Care Activities: Participate in Transition of Care Program/Attend TOC scheduled calls Take all medications as prescribed Attend all scheduled provider appointments Call provider office for new concerns or questions  Continue using home oxygen as prescribed Use assistive devices (your walker) as needed to prevent falls Eat a heart healthy and low salt diet Please go over all of your medications and any medication question/ concerns with your care providers (doctor) Work with the home health team that is going to call you to schedule a visit to your home after your hospital discharge ([facility]:  [phone])  Follow Up Plan:  Telephone follow up appointment with care management team member scheduled for:           Patient verbalizes understanding of instructions and care plan provided today and agrees to view in MyChart. Active MyChart status and patient understanding  of how to access instructions and care plan via MyChart confirmed with patient.     Telephone follow up appointment with care management team member scheduled for:  Thursday 07/30/23- with Thayer Ohm, RN CM   if you are experiencing a Mental Health or Behavioral Health Crisis or need someone to talk to, please  call the Suicide and Crisis Lifeline: 988 call the [place] National Suicide Prevention Lifeline: (386) 059-5732 or TTY: 907-191-3268 TTY 347-519-5285) to talk to a trained counselor call 1-800-273-TALK (toll free, 24 hour hotline) go to [facility] [address], [place] [phone]) call the [facility] Crisis Line: [phone] call 911   [name], RN, BSN, CCRN Alumnus RN Care Manager  Transitions of Care  [facility] - Population Health  [place] [phone]: direct office

## 2023-07-22 NOTE — Transitions of Care (Post Inpatient/ED Visit) (Signed)
07/22/2023  Name: Debra Saunders MRN: [id number] DOB: [birth year]  Today's TOC FU Call Status: Today's TOC FU Call Status:: Successful TOC FU Call Completed TOC FU Call Complete Date: 07/22/23 Patient's Name and Date of Birth confirmed.  Transition Care Management Follow-up Telephone Call Date of Discharge: 07/21/23 Discharge Facility: [facility]) Type of Discharge: Inpatient Admission Primary Inpatient Discharge Diagnosis:: Acute on chronic respiratory failure/ CHF exacerbation How have you been since you were released from the hospital?: Better (per daughter/ caregiver: "She is doing fine. She is pretty much independent; I check on her every day and only live 8 miles away; she handles all of her medications by herself, I just supervise her overall care.  I am on way over to check on her now.") Any questions or concerns?: No  Items Reviewed: Did you receive and understand the discharge instructions provided?: Yes (thoroughly reviewed with patient's daughter/ caregiver who verbalizes good understanding of same) Medications obtained,verified, and reconciled?: No (caregiver/ daughter manages all of patient medical calls/ affairs-- patient self-manages medications; daughter not currently with patient to review) Medications Not Reviewed Reasons:: Other: (caregiver/ daughter manages all of patient medical calls/ affairs-- patient self-manages medications; daughter not currently with patient to review) Any new allergies since your discharge?: No Dietary orders reviewed?: Yes Type of Diet Ordered:: "Healthy as possible" Do you have support at home?: Yes People in Home: alone Name of Support/Comfort Primary Source: Caregiver/ daughter reports patient is essentially independent in self-care activities; supportive daughter [name] assists as/ if needed/ indicated- manages all of patient's medical affairs/ appointments, etc  Medications Reviewed Today: Medications  Reviewed Today     Reviewed by Michaela Corner, RN (Registered Nurse) on 07/22/23 at 1202  Med List Status: <None>   Medication Order Taking? Sig Documenting Provider Last Dose Status Informant  acetaminophen (TYLENOL) 325 MG tablet [id number] No Take 325-650 mg by mouth every 6 (six) hours as needed for mild pain. [provider] Taking Active Self           Med Note Otelia Limes Jul 22, 2023 12:02 PM) 07/22/23- daughter unable to review medications during [facility] call today- not with patient at time of call   albuterol (ACCUNEB) 0.63 MG/3ML nebulizer solution [id number] No Take 1 ampule by nebulization every 6 (six) hours as needed for wheezing. [provider] Taking Active Self  apixaban (ELIQUIS) 5 MG TABS tablet [id number] No Take 1 tablet (5 mg total) by mouth 2 (two) times daily. RESUME SUNDAY pm Duke Salvia, MD 07/17/2023 Active Self  atropine 1 % ophthalmic solution [id number] No Place 1 drop into both eyes at bedtime. [provider] 07/17/2023 Active Self  cefadroxil (DURICEF) 500 MG capsule [id number] No Take 1 capsule (500 mg total) by mouth 2 (two) times daily. Lynn Ito, MD 07/17/2023 Active Self  dapagliflozin propanediol (FARXIGA) 10 MG TABS tablet [id number]  Take 1 tablet (10 mg total) by mouth daily before breakfast. Enedina Finner, MD  Active   gabapentin (NEURONTIN) 600 MG tablet [id number] No Take 600 mg by mouth at bedtime. [provider] 07/17/2023 Active Self  lidocaine (LIDODERM) 5 % [id number] No Place 2 patches onto the skin daily. Remove & Discard patch within 12 hours or as directed by MD Swayze, Ava, DO Past Month Active Self  macitentan (OPSUMIT) 10 MG tablet [id number] No Take 1 tablet (10 mg total) by mouth daily. Laurey Morale, MD 07/17/2023 Active Self  meclizine (ANTIVERT) 25 MG  tablet [id number] No Take 1 tablet (25 mg total) by mouth 3 (three) times daily as needed for dizziness. Laurey Morale, MD Taking Active  Self  Menthol, Topical Analgesic, [facility] EX) [id number] No Apply 1 application topically 4 (four) times daily as needed (for pain). [provider] Taking Active Self  oxyCODONE (OXY IR/ROXICODONE) 5 MG immediate release tablet [id number] No Take 1 tablet (5 mg total) by mouth every 4 (four) hours as needed for moderate pain, breakthrough pain or severe pain. Swayze, Ava, DO Taking Active Self  potassium chloride SA (KLOR-CON M) 20 MEQ tablet [id number]  Take every morning and 20mg  every evening. Enedina Finner, MD  Active   rosuvastatin (CRESTOR) 10 MG tablet [id number] No Take 1 tablet by mouth once daily Laurey Morale, MD 07/17/2023 Active Self  Selexipag (UPTRAVI) 1600 MCG TABS [id number] No Take 1 tablet (1,600 mcg total) by mouth 2 (two) times daily. Laurey Morale, MD 07/17/2023 Active Self  sildenafil (REVATIO) 20 MG tablet [id number] No Take 1 tablet (20 mg total) by mouth 3 (three) times daily. Laurey Morale, MD 07/17/2023 Active Self  spironolactone (ALDACTONE) 25 MG tablet [id number] No Take 1 tablet by mouth daily. [provider] 07/17/2023 Active Self  torsemide (DEMADEX) 20 MG tablet [id number] No Take 40 mg by mouth daily. [provider] 07/17/2023 Active Self           Home Care and Equipment/Supplies: Were Home Health Services Ordered?: Yes Name of Home Health Agency:: [facility] Has Agency set up a time to come to your home?: No EMR reviewed for Home Health Orders: Orders present/patient has not received call (refer to CM for follow-up) (daughter understands to listen for call from [facility]; has used this agency in the past: provided number for agency: [phone]) Any new equipment or medical supplies ordered?: No  Functional Questionnaire: Do you need assistance with bathing/showering or dressing?: Yes (daughter assists/ supervises as indicated) Do you need assistance with meal preparation?: No Do you need assistance with  eating?: No Do you have difficulty maintaining continence: No Do you need assistance with getting out of bed/getting out of a chair/moving?: No (daughter reports patient uses rollator walker "all the time") Do you have difficulty managing or taking your medications?: No (daughter reports patient self-manages medications)  Follow up appointments reviewed: PCP Follow-up appointment confirmed?: No (contacted [facility] scheduling staff to facilitate scheduling of HFU in light of Dr. Purvis Sheffield departure from practice: spoke with Cecilie Kicks- she advises to have daughter contact practice to schedule HFU with APP- called daughter to inform/ update) MD Provider Line Number:[phone] Given: No (verified well-established with current PCP practice: she has an appointment to transfer care from Dr. Birdie Sons to Dr. Clent Ridges, on 08/27/23) Specialist Hospital Follow-up appointment confirmed?: Yes Date of Specialist follow-up appointment?: 07/27/23 Follow-Up Specialty Provider:: CHF provider Do you need transportation to your follow-up appointment?: No Do you understand care options if your condition(s) worsen?: Yes-patient verbalized understanding  SDOH Interventions Today    Flowsheet Row Most Recent Value  SDOH Interventions   Food Insecurity Interventions Intervention Not Indicated  Housing Interventions Intervention Not Indicated  Transportation Interventions Intervention Not Indicated  [daughter [name] provides all transportation]  Utilities Interventions Intervention Not Indicated       Goals Addressed             This Visit's Progress    TOC 30-day Program Care Plan   On track    Current Barriers:  Provider appointments needs HFU  appointment with PCP: her current PCP has left practice and she is scheduled for transfer of care with Dr. Clent Ridges on 08/27/23- however- needs prompt HFU with APP or other provider-- care coordination outreach with practice staff to facilitate Home Health services  Confirmed ordered through [facility] on 07/21/23- pending at time of [facility] call on 07/22/23 Equipment/DME daughter confirms has rollator walker and home O2 as per baseline prior to admission: denies additional DME needs today Resides alone; supportive daughter/ caregiver active in patient's care, manages all aspects of patient's medical affairs/ phone calls/ appointments; patient essentially independent in self-care at home and self- manages medications; uses walker "all the time" for ambulation; uses continuous home O2 at 4 L/min via [place] at baseline- prior to recent hospital admission Hospitalized January 26-28, 2025 for Acute on Chronic CHF/ Respiratory Failure  RNCM Clinical Goal(s):  Patient will work with the Care Management team over the next 30 days to address Transition of Care Barriers: Provider appointments Home Health services take all medications exactly as prescribed and will call provider for medication related questions as evidenced by caregiver reporting/ review of same during TOC 30-day RN CM weekly outreaches attend all scheduled medical appointments: 07/27/23- CHF provider; PCP HFU  as evidenced by review of same with patient's caregiver/ daughter and in EHR during [facility] 30-day RN CM weekly outreaches not experience hospital admission as evidenced by review of EMR. Hospital Admissions in last 6 months = 1  through collaboration with RN Care manager, provider, and care team.   Interventions: Evaluation of current treatment plan related to  self management and patient's adherence to plan as established by provider  Transitions of Care:  New goal. 07/22/23 Durable Medical Equipment (DME) needs assessed with patient/caregiver Doctor Visits  - discussed the importance of doctor visits Communication with PCP scheduling staff re: scheduling of HFU office visit with APP, in light of established PCP no longer being at practice:  patient has current transfer of care appointment scheduled with  Dr. Clent Ridges on 08/27/23; scheduling staff at [facility] advise for daughter to call to get HFU OV scheduled with APP Role of home health services with importance of participation/ ongoing engagement: daughter has not yet heard from [facility]: provided phone number for agency, also provided my direct phone number should issues arise with initiation of services post-TOC call today Attempted medication review: daughter is not currently physically with patient to review: reports she and patient reviewed medications yesterday at time of hospital discharge; reports patient "understood everything and had not questions, already updated her medications at home;" encouraged caregiver to re-contact me after today's TOC call, if she discovers medication questions/ concerns, post-TOC call today Confirmed uses assistive devices on regular basis, at baseline -- rollator walker Confirms continuing to use home O2 as per hospital discharge instructions: as per baseline prior to admission: 4 L/[place]- daughter reports patient has been using home O2 "for many years- 10-15 years" Reinforced safe use/ maintenance of home O2 Confirmed caregiver aware of/ with plans to attend as scheduled: CHF provider office visit on 07/27/23- daughter confirms she will provide transportation Reinforced/ provided education around basics of following heart healthy low salt diet Successfully enrolled into 30-day TOC program- made TOC 30-day program RN CM aware of successful enrollment today  Patient Goals/Self-Care Activities: Participate in Transition of Care Program/Attend TOC scheduled calls Take all medications as prescribed Attend all scheduled provider appointments Call provider office for new concerns or questions  Continue using home oxygen as prescribed Use assistive devices (your  walker) as needed to prevent falls Eat a heart healthy and low salt diet Please go over all of your medications and any medication question/ concerns with your  care providers (doctor) Work with the home health team that is going to call you to schedule a visit to your home after your hospital discharge ([facility]:  [phone])  Follow Up Plan:  Telephone follow up appointment with care management team member scheduled for:  Thursday 07/30/23- with Thayer Ohm, RN CM          Time spent from review to sending copied chart to scheduled TOC 30-day RN CM:  105 minutes  [name], RN, BSN, Media planner  Transitions of Care  [facility] [phone]: direct office

## 2023-07-27 ENCOUNTER — Ambulatory Visit: Payer: Medicare HMO | Attending: Cardiology | Admitting: Cardiology

## 2023-07-27 ENCOUNTER — Telehealth (HOSPITAL_COMMUNITY): Payer: Self-pay | Admitting: Pharmacy Technician

## 2023-07-27 VITALS — BP 112/71 | HR 63 | Wt 187.0 lb

## 2023-07-27 DIAGNOSIS — I5032 Chronic diastolic (congestive) heart failure: Secondary | ICD-10-CM | POA: Diagnosis not present

## 2023-07-27 MED ORDER — TORSEMIDE 20 MG PO TABS: 60.0000 mg | ORAL_TABLET | Freq: Every day | ORAL | 3 refills | Status: DC

## 2023-07-27 NOTE — Telephone Encounter (Signed)
Advanced Heart Failure Patient Advocate Encounter  The patient was approved for a [employer] grant that will help cover the cost of Uptravi, Opsumit and Sildenafil. Total amount awarded, $10,000. Eligibility, 06/28/23 - 06/26/24.  ID [id number]  BIN [id number]  PCN [id number]  Group [id number]  Patient stated she received high bill for Uptravi from [facility]. Called [facility], representative stated that her bill should be resolved within 24 hours and the patient will receive an update at that time as well.

## 2023-07-27 NOTE — Patient Instructions (Addendum)
INCREASE Torsemide to 60 mg daily.  Go DOWN to LOWER LEVEL (LL) to have your blood work completed inside of [employer] office.  Go over to the MEDICAL MALL. Go pass the gift shop and have your blood work completed.  We will only call you if the results are abnormal or if the provider would like to make medication changes.  Your physician has requested that you have an echocardiogram. Echocardiography is a painless test that uses sound waves to create images of your heart. It provides your doctor with information about the size and shape of your heart and how well your heart's chambers and valves are working. This procedure takes approximately one hour. There are no restrictions for this procedure. Please do NOT wear cologne, perfume, aftershave, or lotions (deodorant is allowed). Please arrive 15 minutes prior to your appointment time.  Please note: We ask at that you not bring children with you during ultrasound (echo/ vascular) testing. Due to room size and safety concerns, children are not allowed in the ultrasound rooms during exams. Our front office staff cannot provide observation of children in our lobby area while testing is being conducted. An adult accompanying a patient to their appointment will only be allowed in the ultrasound room at the discretion of the ultrasound technician under special circumstances. We apologize for any inconvenience.  Your physician recommends that you schedule a follow-up appointment in: 1 month.  If you have any questions or concerns before your next appointment please send [place] a message through [place] or call our office at [phone].    TO LEAVE A MESSAGE FOR THE NURSE SELECT OPTION 2, PLEASE LEAVE A MESSAGE INCLUDING: YOUR NAME DATE OF BIRTH CALL BACK NUMBER REASON FOR CALL**this is important as we prioritize the call backs  YOU WILL RECEIVE A CALL BACK THE SAME DAY AS LONG AS YOU CALL BEFORE 4:00 PM  At the [facility], you and  your health needs are our priority. As part of our continuing mission to provide you with exceptional heart care, we have created designated Provider Care Teams. These Care Teams include your primary Cardiologist (physician) and Advanced Practice Providers (APPs- Physician Assistants and Nurse Practitioners) who all work together to provide you with the care you need, when you need it.   You may see any of the following providers on your designated Care Team at your next follow up: Dr Arvilla Meres Dr Marca Ancona Dr. Dorthula Nettles Dr. Clearnce Hasten Amy Filbert Schilder, NP Robbie Lis, [place] [facility] [place], [place] Brynda Peon, NP [place] Lee, NP Karle Plumber, PharmD   Please be sure to bring in all your medications bottles to every appointment.    Thank you for choosing [facility]

## 2023-07-27 NOTE — Progress Notes (Signed)
Date:  07/27/2023   ID:  Debra Saunders, DOB 20-Apr-1946, MRN [id number]   Provider location: [place] [facility] Type of Visit: Established patient   PCP:  Glori Luis, MD  Cardiologist:  [name], MD HF Cardiologist: Dr. Shirlee Latch  Chief Complaint: HF follow up   History of Present Illness: Debra Saunders is a 78 y.o. female who has a history of chronic diastolic heart failure, nonobstructive CAD, atrial fibrillation on eliquis, bradycardia s/p PPM [facility]), chronic respiratory failure on home O2, COPD, PAH, DM2, HTN, CKD (required HD in past), DVT s/p IVC filter 2004, HL, traumatic subarachnoid hemorrhage while on xarelto 2014, CVA 2014, OSA, and chronic lymphedema.   Admitted 10/24-10/29/19 with acute on chronic diastolic HF. HF team consulted with concerns for low output. PICC was placed and milrinone to support RV function was started along with IV Lasix to facilitate diuresis. RHC done to evaluate PAH (see below). V/Q scan negative. High res CT showed pulmonary fibrosis. Pulmonary consult and recommended stopping amiodarone. She was transitioned to torsemide 60 mg BID. DC weight: 226 lbs.    She has started on selexipag for pulmonary hypertension in addition to Opsumit and Revatio.      Echo in 3/21 showed EF 60-65%, normal RV, PASP 50 mmHg, moderate-severe MR.   Had RHC 08/30/2020- low/normal filling pressures, mild pumonary htn, and preserved cardiac. output.   Admitted to [facility] 1/23 with endocarditis and PNA, requiring pressors. BCx positive for Streptococcus group G.  All her PAH meds were held. TEE showed a large vegetation on the pacer wire in the right atrium above the tricuspid valve (1 x 1.5 cm).  No vegetations noted on the valves.  EF was 55% and there was note of mild to moderate mitral regurgitation. RV ok. She was transferred to [facility] for pacemaker extraction. EP felt she was not a candidate due to multiple comorbidities. ID consulted  and planned for 6 weeks of IV abx. She developed Afib with RVR and started on amio. She underwent successful TEE-guided DCCV 07/14/21. Hospitalization complicated by anemia, hypotension, and AKI.  Midodrine started during admission. She was discharged to SNF, weight 198 lbs.  Admitted 3/23 with lightheadedness. Treated for sinus infection, HF meds held due to othostasis. Midodrine increased to 10 mg tid. HH PT arranged.  Repeat echo in 3/23 showed EF 60-65%, mild LVH, normal RV, IVC normal, no definite vegetation noted.   Follow up 8/23, continued with dyspnea, despite euvolemia. Amiodarone stopped, in case this was causing her symptoms, and arranged for RHC. RHC showed showed normal filling and PA pressures. Felt symptoms were pulmonary-driven.  She was admitted in 1/25 with chest pain. She was in atrial fibrillation. She was diuresed, RHC/LHC was done showing minimal CAD and moderate pulmonary hypertension with normal filling pressures after diuresis.   Today she returns for HF follow up with her daughter.  Weight is down > 10 lbs compared to last appointment. She uses 4L home oxygen during the day and CPAP at night. She has had increased dyspnea over the last few days.  She is short of breath bathing and dressing. She gets short of breath doing housework.  Uses walker.  Midodrine was stopped in the hospital but she started back on this after she got home as she was afraid she would get lightheaded.  BP stable today.  She feels like she has built up fluid since leaving the hospital last month. No chest pain.  No  orthopnea/PND.    ECG (1/25, personally reviewed): Atrial fibrillation with QTc 427 msec  6 minute walk (1/20): 98 m 6 minute walk (7/20): 213 m 6 minute walk (3/22): 198 m 6 minute walk (10/22): 229 m   Labs (11/19): digoxin 0.8 Labs (1/20): K 4.2, creatinine 1.36 => 1.33, digoxin 0.8 Labs (2/20): digoxin 0.2, K 4.3, creatinine 1.32 Labs (5/20): K 4.5, creatinine 1.4, digoxin 0.8 Labs  (1/22): K 4, creatinine 1.07 Labs (8/22): K 4.1, creatinine 1.03 Labs (1/23): K 3.7, creatinine 1.29 Labs (1/23): K 4.7, creatinine 1.07 Labs (3/23): K 4.3, creatinine 1.24 => 1.07, hgb 9.4 Labs (6/23): BNP 158 Labs (7/23): K 3.9, creatinine 1.1 Labs (10/23): K 3.4, creatinine 1.25, hgb 10 Labs (6/24): K 3.6, creatinine 0.93 Labs (8/24): K 4.4, creatinine 0.97 Labs (1/25): K 3.9, creatinine 0.68, BNP 336   PMH: 1. Lymphedema: Left lower leg (chronic).  2. COPD 3. 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  - PFTs (2017) with FEV1 64%, FEV1/FVC ratio 106%, TLC 49%, DLCO 39% (restrictive).  - CT chest high resolution (10/19): Pulmonary parenchymal pattern of fibrosis.  - CT chest (4/22): Chronic ILD 4. Atrial fibrillation: Paroxysmal.  She was previously off amiodarone due to lung disease.  - 2/19 DCCV.  - 1/23 DCCV 5. H/o DVT s/p IVC filter in 2004.  6. CAD: 4/19 coronary angiography with minimal coronary disease.  - LHC (1/25): Minimal coronary disease.  7. Bradycardia: [employer] PPM.  8. H/o traumatic SAH 2014.  9. CKD: Stage 3.  10. H/o CVA 11. OSA: CPAP 12. HTN 13. Type II diabetes 14. Pulmonary hypertension:  - PFTs 2017 with severe restriction.  - Echo (10/19): EF 60-65%, PASP 52 mmHg - RHC (4/19): Severe PAH with PVR 7.2 WU.  - RHC (10/19): mean RA 6, PA 56/17 mean 33, PCWP mean 12, CI 3.23, PVR 3.4 WU.  - RHC (3/22): mean 3, RV 30/1, PA 33/11, mean 20, PCWP mean 6, Oxygen saturations: PA 69% AO 100%, Cardiac Output (Fick) 6.18, Cardiac Index (Fick) 3.36, and PVR 2.3 WU - V/Q scan (10/19): No chronic PE.  - Anti-SCL 70 and ANA negative.  ACE normal.  RF slightly elevated at 15.  - Echo (3/21): EF 60-65%, normal RV, PASP 50 mmHg, moderate-severe MR. - TEE (1/23): EF 60-65%, mild RV dilation with normal RV systolic function, MR mild-moderate, moderate TR with vegetation on TV and pacemaker lead - Echo (1/23): EF 60-65%, normal RV, RVSP 51.5  mmHg, mild-moderate MR, moderate TR - Echo (3/23): EF 60-65%, mild LVH, normal RV, IVC normal, no definite vegetation noted.  - RHC (8/23): RA 3, PA 29/10 (mean 16), PCWP 12, CO/CI (Fick) 6.18/3.37 - RHC (1/25): mean RA 5, PA 54/20 mean 29, mean PCWP 13, CI 2.36, PVR 3.7 WU.  15. 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  16. Mitral regurgitation: moderate to severe on 3/22 echo.  17. Zio patch (3/22): 9.3% PACs, no AF, 2.9% PVCs 18. 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  Deemed too high risk to remove device so have tried to treat through.  19. Fe deficiency anemia 20. B12 deficiency  FH: mother with CHF  Current Outpatient Medications  Medication Sig Dispense Refill   acetaminophen (TYLENOL) 325 MG tablet Take 325-650 mg by mouth every 6 (six) hours as needed for mild pain.     albuterol (ACCUNEB) 0.63 MG/3ML nebulizer solution Take 1 ampule by nebulization every 6 (six) hours as needed for  wheezing.     apixaban (ELIQUIS) 5 MG TABS tablet Take 1 tablet (5 mg total) by mouth 2 (two) times daily. RESUME SUNDAY pm 180 tablet 3   atropine 1 % ophthalmic solution Place 1 drop into both eyes at bedtime.     cefadroxil (DURICEF) 500 MG capsule Take 1 capsule (500 mg total) by mouth 2 (two) times daily. 60 capsule 12   dapagliflozin propanediol (FARXIGA) 10 MG TABS tablet Take 1 tablet (10 mg total) by mouth daily before breakfast. 90 tablet 1   gabapentin (NEURONTIN) 600 MG tablet Take 600 mg by mouth at bedtime.     lidocaine (LIDODERM) 5 % Place 2 patches onto the skin daily. Remove & Discard patch within 12 hours or as directed by MD 30 patch 0   macitentan (OPSUMIT) 10 MG tablet Take 1 tablet (10 mg total) by mouth daily. 30 tablet 11   meclizine (ANTIVERT) 25 MG tablet Take 1 tablet (25 mg total) by mouth 3 (three) times daily as needed for dizziness. 30 tablet 1   Menthol, Topical Analgesic, (BIOFREEZE EX) Apply 1 application topically 4 (four)  times daily as needed (for pain).     oxyCODONE (OXY IR/ROXICODONE) 5 MG immediate release tablet Take 1 tablet (5 mg total) by mouth every 4 (four) hours as needed for moderate pain, breakthrough pain or severe pain. 12 tablet 0   potassium chloride SA (KLOR-CON M) 20 MEQ tablet Take every morning and 20mg  every evening. 60 tablet 1   rosuvastatin (CRESTOR) 10 MG tablet Take 1 tablet by mouth once daily 30 tablet 0   Selexipag (UPTRAVI) 1600 MCG TABS Take 1 tablet (1,600 mcg total) by mouth 2 (two) times daily. 60 tablet 11   sildenafil (REVATIO) 20 MG tablet Take 1 tablet (20 mg total) by mouth 3 (three) times daily. 90 tablet 3   spironolactone (ALDACTONE) 25 MG tablet Take 1 tablet by mouth daily.     torsemide (DEMADEX) 20 MG tablet Take 3 tablets (60 mg total) by mouth daily. 200 tablet 3   No current facility-administered medications for this visit.   Allergies:   Aspirin and Other   Social History:  The patient  reports that she quit smoking about 33 years ago. Her smoking use included cigarettes. She started smoking about 35 years ago. She has a 0.6 pack-year smoking history. She has been exposed to tobacco smoke. She has never used smokeless tobacco. She reports that she does not drink alcohol and does not use drugs.   Family History:  The patient's family history includes Bone cancer in her sister; Breast cancer in her maternal aunt and sister; COPD in her mother; Heart disease in her mother; Hypertension in her father, mother, sister, and sister; Stroke in her son; Thyroid disease in her mother, sister, and sister.   ROS:  Please see the history of present illness.   All other systems are personally reviewed and negative.   Recent Labs: 11/18/2022: ALT 15 07/17/2023: B Natriuretic Peptide 335.5 07/20/2023: Hemoglobin 11.2; Platelets 206 07/21/2023: BUN 10; Creatinine, Ser 0.68; Magnesium 2.1; Potassium 3.9; Sodium 140  Personally reviewed   Wt Readings from Last 3 Encounters:   07/27/23 187 lb (84.8 kg)  07/21/23 192 lb 14.4 oz (87.5 kg)  07/17/23 197 lb 2 oz (89.4 kg)    BP 112/71   Pulse 63   Wt 187 lb (84.8 kg)   SpO2 96%   BMI 36.52 kg/m   Physical Exam General: NAD Neck:  JVP 8-9 cm, no thyromegaly or thyroid nodule.  Lungs: Crackles at bases.  CV: Nondisplaced PMI.  Heart irregular S1/S2, no S3/S4, no murmur.  Lymphedema left lower leg, 1+ ankle edema on right.  No carotid bruit.  Difficult to palpate pedal pulses.  Abdomen: Soft, nontender, no hepatosplenomegaly, no distention.  Skin: Intact without lesions or rashes.  Neurologic: Alert and oriented x 3.  Psych: Normal affect. Extremities: No clubbing or cyanosis.  HEENT: Normal.   Assessment & Plan 1. Atrial fibrillation with tachy-brady syndrome:  Had been off amiodarone since 2019 due to interstitial lung disease/pulmonary fibrosis. Amiodarone restarted during 1/23 admission. s/p TEE-guided DCCV 1/23, amiodarone later stopped again.  She has been in atrial fibrillation recently, may have helped trigger 1/25 admission.  She was not cardioverted at that time as it was not thought that she would stay out of AF without an anti-arrhythmic medication.  Trying to avoid amiodarone.  - I would like her to be seen back in AF clinic soon to consider Tikosyn admission.  QTc normal today, most recent creatinine was normal.    - Continue Eliquis 5 mg bid.   2. 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 Echo in 3/23 showed EF 60-65%, mild LVH, normal RV, IVC normal, no definite vegetation noted. She has been on midodrine for chronic hypotension but BP has been higher recently.  Repeat RHC (8/23) showed normal filling and PA pressures. RHC in 1/25 showed PA 54/20 with PVR 3.7 WU.  NYHA class III, mild volume overload.  Worsened CHF may have been triggered by  persistent atrial fibrillation.  - Would like to get back in [facility], see above regarding EP referral for Tikosyn admission.   - Increase torsemide to 60 mg daily.  BMET/BNP today and BMET in 10 days.  - Continue Farxiga 10 mg daily.  - Needs echo, will arrange.  3. Pulmonary hypertension: Based on 4/19 cath, she had severe PAH with PVR 7.2 WU. Chronic hypoxemic respiratory failure, on home oxygen.  She has a history of remote DVT (2004) but V/Q scan negative for evidence of chronic PE.  High resolution CT chest in 10/19 showed pulmonary fibrosis, and PFTs in 2017 showed severe restriction.  Serological workup for rheumatological diseases was unremarkable.  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 Amiodarone was stopped in past given lung parenchymal disease. 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 RHC (8/23) showed normal filling and PA pressures. RHC in 1/25 showed PA 54/20 with PVR 3.7 WU. - Continue macitentan 10 daily.  - Continue Selexipag 1600 bid => needs pharmacy help with getting this medication.  Message sent to Karle Plumber.   - Continue sildenafil 20 mg tid.  - Continue home oxygen and CPAP.  - As above, now off amiodarone due to possibility of pulmonary toxicity.   4. Endocarditis: in 1/23.  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  Group G strep bacteremia.  Septic emboli to lungs. Seen by EP, thought to be too high risk for device extraction. Unfortunately she will be at high risk for complications going forward.  - She is now on lifetime suppression with cefadroxil.  5. Chronic hypoxemic respiratory failure:  At baseline, on 4L home oxygen with CPAP at night  for interstitial lung disease and OSA.   - Continue home O2 4 L by [place] (baseline).  - Continue CPAP.  6. H/o DVT: Anticoagulated.  7. Left leg lymphedema: Chronic, unchanged.  8.  OSA: CPAP.   9. Orthostatic hypotension: BP higher, think we can work on titrating down midodrine.  Decrease to 2.5 mg tid.   10. Bradycardia: has [employer] PPM.   Followup in 1 month.   I spent 41 minutes reviewing records, interviewing/examining patient, and managing orders.   Signed, Marca Ancona, MD  07/27/2023  [facility] [address] [facility] [place] [address] [phone] (office) [phone] (fax)

## 2023-07-28 ENCOUNTER — Inpatient Hospital Stay: Payer: Medicare HMO

## 2023-07-28 ENCOUNTER — Inpatient Hospital Stay: Payer: Medicare HMO | Admitting: Nurse Practitioner

## 2023-07-28 ENCOUNTER — Inpatient Hospital Stay: Payer: Medicare HMO | Attending: Internal Medicine

## 2023-07-28 ENCOUNTER — Encounter: Payer: Self-pay | Admitting: Nurse Practitioner

## 2023-07-28 VITALS — HR 81 | Temp 97.1°F | Wt 186.0 lb

## 2023-07-28 DIAGNOSIS — Z7901 Long term (current) use of anticoagulants: Secondary | ICD-10-CM | POA: Diagnosis not present

## 2023-07-28 DIAGNOSIS — E876 Hypokalemia: Secondary | ICD-10-CM | POA: Diagnosis not present

## 2023-07-28 DIAGNOSIS — D631 Anemia in chronic kidney disease: Secondary | ICD-10-CM | POA: Insufficient documentation

## 2023-07-28 DIAGNOSIS — N183 Chronic kidney disease, stage 3 unspecified: Secondary | ICD-10-CM | POA: Diagnosis not present

## 2023-07-28 DIAGNOSIS — R197 Diarrhea, unspecified: Secondary | ICD-10-CM | POA: Insufficient documentation

## 2023-07-28 DIAGNOSIS — E538 Deficiency of other specified B group vitamins: Secondary | ICD-10-CM | POA: Diagnosis not present

## 2023-07-28 DIAGNOSIS — K909 Intestinal malabsorption, unspecified: Secondary | ICD-10-CM | POA: Diagnosis not present

## 2023-07-28 DIAGNOSIS — I4891 Unspecified atrial fibrillation: Secondary | ICD-10-CM | POA: Diagnosis not present

## 2023-07-28 DIAGNOSIS — R5383 Other fatigue: Secondary | ICD-10-CM | POA: Diagnosis not present

## 2023-07-28 DIAGNOSIS — D649 Anemia, unspecified: Secondary | ICD-10-CM

## 2023-07-28 DIAGNOSIS — Z86718 Personal history of other venous thrombosis and embolism: Secondary | ICD-10-CM | POA: Diagnosis not present

## 2023-07-28 DIAGNOSIS — E611 Iron deficiency: Secondary | ICD-10-CM

## 2023-07-28 DIAGNOSIS — Z950 Presence of cardiac pacemaker: Secondary | ICD-10-CM | POA: Insufficient documentation

## 2023-07-28 DIAGNOSIS — Z87891 Personal history of nicotine dependence: Secondary | ICD-10-CM | POA: Diagnosis not present

## 2023-07-28 LAB — BASIC METABOLIC PANEL
Anion gap: 10 (ref 5–15)
BUN/Creatinine Ratio: 18 (ref 12–28)
BUN: 18 mg/dL (ref 8–27)
BUN: 19 mg/dL (ref 8–23)
CO2: 22 mmol/L (ref 20–29)
CO2: 23 mmol/L (ref 22–32)
Calcium: 9.2 mg/dL (ref 8.7–10.3)
Calcium: 8.9 mg/dL (ref 8.9–10.3)
Chloride: 103 mmol/L (ref 96–106)
Chloride: 102 mmol/L (ref 98–111)
Creatinine, Ser: 1 mg/dL (ref 0.57–1.00)
Creatinine, Ser: 1.1 mg/dL — ABNORMAL HIGH (ref 0.44–1.00)
GFR, Estimated: 51 mL/min — ABNORMAL LOW (ref >60)
Glucose, Bld: 110 mg/dL — ABNORMAL HIGH (ref 70–99)
Glucose: 86 mg/dL (ref 70–99)
Potassium: 5.3 mmol/L — ABNORMAL HIGH (ref 3.5–5.2)
Potassium: 4.6 mmol/L (ref 3.5–5.1)
Sodium: 141 mmol/L (ref 134–144)
Sodium: 135 mmol/L (ref 135–145)
eGFR: 57 mL/min/{1.73_m2} — ABNORMAL LOW (ref >59)

## 2023-07-28 LAB — CBC WITH DIFFERENTIAL (CANCER CENTER ONLY)
Abs Immature Granulocytes: 0.01 10*3/uL (ref 0.00–0.07)
Basophils Absolute: 0 10*3/uL (ref 0.0–0.1)
Basophils Relative: 1 %
Eosinophils Absolute: 0.2 10*3/uL (ref 0.0–0.5)
Eosinophils Relative: 4 %
HCT: 33.9 % — ABNORMAL LOW (ref 36.0–46.0)
Hemoglobin: 10.2 g/dL — ABNORMAL LOW (ref 12.0–15.0)
Immature Granulocytes: 0 %
Lymphocytes Relative: 28 %
Lymphs Abs: 1.2 10*3/uL (ref 0.7–4.0)
MCH: 25.8 pg — ABNORMAL LOW (ref 26.0–34.0)
MCHC: 30.1 g/dL (ref 30.0–36.0)
MCV: 85.8 fL (ref 80.0–100.0)
Monocytes Absolute: 0.3 10*3/uL (ref 0.1–1.0)
Monocytes Relative: 8 %
Neutro Abs: 2.7 10*3/uL (ref 1.7–7.7)
Neutrophils Relative %: 59 %
Platelet Count: 203 10*3/uL (ref 150–400)
RBC: 3.95 MIL/uL (ref 3.87–5.11)
RDW: 13.9 % (ref 11.5–15.5)
WBC Count: 4.5 10*3/uL (ref 4.0–10.5)
nRBC: 0 % (ref 0.0–0.2)

## 2023-07-28 LAB — BRAIN NATRIURETIC PEPTIDE: BNP: 170.6 pg/mL — ABNORMAL HIGH (ref 0.0–100.0)

## 2023-07-28 LAB — VITAMIN B12: Vitamin B-12: 192 pg/mL (ref 180–914)

## 2023-07-28 MED ORDER — IRON SUCROSE 20 MG/ML IV SOLN
200.0000 mg | Freq: Once | INTRAVENOUS | Status: DC
  Filled 2023-07-28: qty 10

## 2023-07-28 MED ORDER — CYANOCOBALAMIN 1000 MCG/ML IJ SOLN
1000.0000 ug | Freq: Once | INTRAMUSCULAR | Status: AC
  Administered 2023-07-28: 1000 ug via INTRAMUSCULAR
  Filled 2023-07-28: qty 1
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
## 2023-07-28 NOTE — Telephone Encounter (Signed)
Appointment is 2/11 with EP

## 2023-07-28 NOTE — Progress Notes (Signed)
 [facility] CONSULT NOTE  Patient Care Team: Maribeth Camellia MATSU, MD as PCP - General (Family Medicine) Darron Deatrice LABOR, MD as PCP - Cardiology (Cardiology) Fernande Elspeth BROCKS, MD as PCP - Electrophysiology (Cardiology) Lenord Sensing, MD as Referring Physician (Allergy) Alaine Vicenta NOVAK, MD as Referring Physician (Internal Medicine) Rennie Cindy SAUNDERS, MD as Consulting Physician (Hematology and Oncology) Green, Davina E, RN as [facility] Care Management [place]omsher, Engineer, Site, RN as [facility] Care Management Anemia CHIEF COMPLAINTS/PURPOSE OF CONSULTATION: Anemia   HEMATOLOGY HISTORY  # CHRONIC IRON  DEFICIENCY ANEMIA [BL 10-11]-Jan today 21 PCP Hb-8.8; [Iron  sat-5%; ferritin 11] EGD-; colonoscopy MARCH 2020 -polyp [[facility]-hold further work-up secondary to cardiac issues]; remote-EGD-?;  February 2021-LDH/haptoglobin normal; kappa lambda light chain ratio/M panel normal;  on Venofer ; November 2020-CT scan question early cirrhosis/no splenomegaly.  No acute process; MARCH 2021- STOOL OCCULT x3- NEGATIVE.   # B12 deficiency-IM every 3 to 4 months  # 014 -question blood transfusion  [patient admitted for stroke]; CHF chronic -[ 2004 on 4 lits; Dr.Arida/Klein]; CT scan chest 2021 -no adenopathy; fibrotic changes; walker; CKD stage III-   # Incidental right axillary mass-s/p US  biopsy [PCP] -negative for malignancy.  HISTORY OF PRESENTING ILLNESS: Patient in wheelchair on [place] O2.  Alone.  Debra Saunders 78 y.o. female multiple comorbidities including A-fib on anticoagulation, COPD/chronic congestive failure on oxygen  and anemia/likely secondary to ? CKD stage III, who receives as needed iron , returns to clinic for follow up. She has been in the hospital for acute on chronic CHF and was discharged on 07/21/23. Continues to have some shortness of breath and fatigue. She reports compliance with eliquis . Denies black or bloody stools. Denies vaginal bleeding or blood in her  urine. Continues oxygen .    Review of Systems  Constitutional:  Positive for malaise/fatigue. Negative for chills, diaphoresis, fever and weight loss.  HENT:  Negative for nosebleeds and sore throat.   Eyes:  Negative for double vision.  Respiratory:  Positive for shortness of breath. Negative for cough, hemoptysis, sputum production and wheezing.   Cardiovascular:  Negative for chest pain, palpitations, orthopnea and leg swelling.  Gastrointestinal:  Negative for abdominal pain, blood in stool, constipation, heartburn, melena, nausea and vomiting.  Genitourinary:  Negative for dysuria, frequency and urgency.  Musculoskeletal:  Negative for back pain and joint pain.  Skin: Negative.  Negative for itching and rash.  Neurological:  Negative for dizziness, tingling, focal weakness, weakness and headaches.  Endo/Heme/Allergies:  Does not bruise/bleed easily.  Psychiatric/Behavioral:  Negative for depression. The patient is not nervous/anxious and does not have insomnia.     MEDICAL HISTORY:  Past Medical History:  Diagnosis Date   Arthritis    Atrial fibrillation, persistent (HCC)    a. s/p TEE-guided DCCV on 08/20/2017; b. 06/2021 Recurrent Afib-->amio/TEE/DCCV.   CAD (coronary artery disease)    a. 01/2012 Cath: nonobs dzs; b. 04/2014 MV: No ischemia; c. 09/2017 Cath: Minimal nonobs RCA dzs, otw nl cors.   Cardiorenal syndrome    a. 09/2017 Creat rose to 2.7 w/ diuresis-->HD x 2.   Chronic back pain    Chronic combined systolic (congestive) and diastolic (congestive) heart failure (HCC)    a. Dx 2005 @ [facility];  b. 02/2014 Echo: EF 55-60%; c. 07/2017: EF 30-35%; d. 09/2017 Echo: EF 40-45%; e. 02/2018 Echo: EF 55-60%; f. 08/2019 Echo: EF 60-65%; g. 08/2020 RHC: RA 3, RV 30/1, PA 33/11 (20), PCWP 6; f. 08/2021 Echo: EF 60-65%, no rwma, GrIDD,  nl RV fxn, RVSP 40.63mmHg, mod dil LA, mod TR.   Chronic respiratory failure (HCC)    a. on home O2   Gastritis    History of COVID-19    History of  endocarditis 08/22/2021   History of intracranial hemorrhage    a. 6/14 described by neurosurgery as small frontal posttraumatic SAH  - pt was on xarelto  @ the time.   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 Chronic LLE edema.   NICM (nonischemic cardiomyopathy) (HCC)    a. 09/2017 Echo: EF 40-45%; b. 09/2017 Cath: minor, insignificant CAD; c. 02/2018 Echo: EF 55-60%, Gr1 DD; d. 08/2019 Echo: EF 60-65%, Gr2 DD.   Obesity    PAH (pulmonary artery hypertension) (HCC)    a. 09/2017 Echo: PASP ; b. 09/2017 RHC: RA 23, RV 84/13, PA 84/29, PCWP 20, CO 3.89, CI 1.89. LVEDP 18; c. 08/2020 RHC: RA 3, RV 30/1, PA 33/11 (20), PCWP 6. CO/CI 6.18/3.36. PVR 2.3 WU.   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 06/2021. Veg on PPM lead. Not candidate for extraction-->s/p 6 wks abx.   Sepsis with metabolic encephalopathy (HCC)    Sleep apnea    Streptococcal bacteremia    Stroke [facility]) 2014   Subarachnoid hemorrhage (HCC) 01/19/2013   Symptomatic bradycardia    a. s/p BSX PPM.   Thyroid  nodule    Type II diabetes mellitus (HCC)    Urinary incontinence     SURGICAL HISTORY: Past Surgical History:  Procedure Laterality Date   ABDOMINAL HYSTERECTOMY  1969   BREAST BIOPSY Right 10/28/2019   lymph node bx, [facility]  Bx, pending path    CARDIAC CATHETERIZATION  2013   @ [facility]: No obstructive CAD: Only 20% ostial left circumflex.   CARDIOVERSION N/A 10/12/2017   Procedure: CARDIOVERSION;  Surgeon: Perla Evalene PARAS, MD;  Location: [facility] ORS;  Service: Cardiovascular;  Laterality: N/A;   CARDIOVERSION N/A 07/04/2021   Procedure: CARDIOVERSION;  Surgeon: Rolan Ezra RAMAN, MD;  Location: [facility] ENDOSCOPY;  Service: Cardiovascular;  Laterality: N/A;   CARDIOVERSION N/A 03/27/2023   Procedure: CARDIOVERSION;  Surgeon: Rolan Ezra RAMAN, MD;   Location: [facility] ORS;  Service: Cardiovascular;  Laterality: N/A;   CATARACT EXTRACTION W/PHACO Right 09/19/2020   Procedure: CATARACT EXTRACTION PHACO AND INTRAOCULAR LENS PLACEMENT (IOC) RIGHT DIABETIC 3.95 00:43.0 9.2%;  Surgeon: Mittie Gaskin, MD;  Location: [facility] SURGERY CNTR;  Service: Ophthalmology;  Laterality: Right;  Pt requests to be last sleep apnea   CATARACT EXTRACTION W/PHACO Left 10/03/2020   Procedure: CATARACT EXTRACTION PHACO AND INTRAOCULAR LENS PLACEMENT (IOC) LEFT DIABETIC;  Surgeon: Mittie Gaskin, MD;  Location: [facility] SURGERY CNTR;  Service: Ophthalmology;  Laterality: Left;  3.25 0:59.3 5.5%   CHOLECYSTECTOMY     COLONOSCOPY WITH PROPOFOL  N/A 09/10/2018   Procedure: COLONOSCOPY WITH PROPOFOL ;  Surgeon: Viktoria Lamar DASEN, MD;  Location: [facility] ENDOSCOPY;  Service: Endoscopy;  Laterality: N/A;   COLONOSCOPY WITH PROPOFOL  N/A 10/22/2022   Procedure: COLONOSCOPY WITH PROPOFOL ;  Surgeon: Onita Elspeth Sharper, DO;  Location: [facility] ENDOSCOPY;  Service: Gastroenterology;  Laterality: N/A;   CYST REMOVAL TRUNK  2002   BACK   DIALYSIS/PERMA CATHETER INSERTION N/A 10/06/2017   Procedure: DIALYSIS/PERMA CATHETER INSERTION;  Surgeon: Jama Cordella MATSU, MD;  Location: [facility] INVASIVE CV LAB;  Service: Cardiovascular;  Laterality: N/A;   ESOPHAGOGASTRODUODENOSCOPY (EGD) WITH PROPOFOL  N/A 10/21/2022   Procedure: ESOPHAGOGASTRODUODENOSCOPY (EGD) WITH PROPOFOL ;  Surgeon: Onita Elspeth Sharper, DO;  Location: [facility] ENDOSCOPY;  Service: Gastroenterology;  Laterality: N/A;   GIVENS CAPSULE STUDY N/A 10/22/2022   Procedure: GIVENS CAPSULE STUDY;  Surgeon: Onita Elspeth Sharper, DO;  Location: [facility] ENDOSCOPY;  Service: Gastroenterology;  Laterality: N/A;  IF NOTHING IS FOUND WITH COLONOSCOPY   INSERT / REPLACE / REMOVE PACEMAKER     lipoma removal  2014   back   PACEMAKER INSERTION  2015   [employer] dual chamber pacemaker implanted by Dr Fernande for symptomatic bradycardia    PERMANENT PACEMAKER INSERTION N/A 01/09/2014   Procedure: PERMANENT PACEMAKER INSERTION;  Surgeon: Elspeth JAYSON Fernande, MD;  Location: [facility] CATH LAB;  Service: Cardiovascular;  Laterality: N/A;   PPM GENERATOR CHANGEOUT N/A 04/01/2023   Procedure: PPM GENERATOR CHANGEOUT;  Surgeon: Fernande Elspeth JAYSON, MD;  Location: [facility] INVASIVE CV LAB;  Service: Cardiovascular;  Laterality: N/A;   RIGHT HEART CATH N/A 04/19/2018   Procedure: RIGHT HEART CATH;  Surgeon: Rolan Ezra RAMAN, MD;  Location: [facility] INVASIVE CV LAB;  Service: Cardiovascular;  Laterality: N/A;   RIGHT HEART CATH N/A 08/30/2020   Procedure: RIGHT HEART CATH;  Surgeon: Rolan Ezra RAMAN, MD;  Location: [facility] INVASIVE CV LAB;  Service: Cardiovascular;  Laterality: N/A;   RIGHT HEART CATH N/A 02/11/2022   Procedure: RIGHT HEART CATH;  Surgeon: Rolan Ezra RAMAN, MD;  Location: [facility] INVASIVE CV LAB;  Service: Cardiovascular;  Laterality: N/A;   RIGHT/LEFT HEART CATH AND CORONARY ANGIOGRAPHY N/A 10/19/2017   Procedure: RIGHT/LEFT HEART CATH AND CORONARY ANGIOGRAPHY;  Surgeon: Darron Deatrice LABOR, MD;  Location: [facility] INVASIVE CV LAB;  Service: Cardiovascular;  Laterality: N/A;   RIGHT/LEFT HEART CATH AND CORONARY ANGIOGRAPHY N/A 07/20/2023   Procedure: RIGHT/LEFT HEART CATH AND CORONARY ANGIOGRAPHY;  Surgeon: Darron Deatrice LABOR, MD;  Location: [facility] INVASIVE CV LAB;  Service: Cardiovascular;  Laterality: N/A;   TEE WITHOUT CARDIOVERSION N/A 08/20/2017   Procedure: TRANSESOPHAGEAL ECHOCARDIOGRAM (TEE) & Direct current cardioversion;  Surgeon: Perla Evalene PARAS, MD;  Location: [facility] ORS;  Service: Cardiovascular;  Laterality: N/A;   TEE WITHOUT CARDIOVERSION N/A 06/28/2021   Procedure: TRANSESOPHAGEAL ECHOCARDIOGRAM (TEE);  Surgeon: Perla Evalene PARAS, MD;  Location: [facility] ORS;  Service: Cardiovascular;  Laterality: N/A;   TEE WITHOUT CARDIOVERSION N/A 07/04/2021   Procedure: TRANSESOPHAGEAL ECHOCARDIOGRAM (TEE);  Surgeon: Rolan Ezra RAMAN, MD;  Location: [facility] ENDOSCOPY;  Service:  Cardiovascular;  Laterality: N/A;   TRIGGER FINGER RELEASE      SOCIAL HISTORY: Social History   Socioeconomic History   Marital status: Divorced    Spouse name: Not on file   Number of children: 4   Years of education: 12   Highest education level: Associate degree: occupational, scientist, product/process development, or vocational program  Occupational History   Occupation: Retired- factory work    Comment: exposed to interior and spatial designer  Tobacco Use   Smoking status: Former    Current packs/day: 0.00    Average packs/day: 0.3 packs/day for 2.0 years (0.6 ttl pk-yrs)    Types: Cigarettes    Start date: 06/23/1988    Quit date: 06/23/1990    Years since quitting: 33.1    Passive exposure: Past   Smokeless tobacco: Never  Vaping Use   Vaping status: Never Used  Substance and Sexual Activity   Alcohol use: No   Drug use: No   Sexual activity: Not Currently    Birth control/protection: Surgical  Other Topics Concern   Not on file  Social History Narrative   Lives in [place] with daughter. Has 4 children. No pets.      Work - hotel manager, retired      Diet - regular      02/14/2019: reports has Meals On Wheels delivered to her home;       Remote hx of smoking > 30 years; No alcohol-    Social Drivers of Corporate Investment Banker Strain: Low Risk  (03/09/2023)   Overall Financial Resource Strain (CARDIA)    Difficulty of Paying Living Expenses: Not hard at all  Food Insecurity: No Food Insecurity (07/22/2023)   Hunger Vital Sign    Worried About Running Out of Food in the Last Year: Never true    Ran Out of Food in the Last Year: Never true  Transportation Needs: No Transportation Needs (07/22/2023)   PRAPARE - Administrator, Civil Service (Medical): No    Lack of Transportation (Non-Medical): No  Physical Activity: Inactive (03/09/2023)   Exercise Vital Sign    Days of Exercise per Week: 0 days    Minutes of Exercise per Session: 0 min  Stress: No Stress Concern Present (03/09/2023)    [employer] of Occupational Health - Occupational Stress Questionnaire    Feeling of Stress : Not at all  Social Connections: Socially Isolated (07/19/2023)   Social Connection and Isolation Panel [NHANES]    Frequency of Communication with Friends and Family: More than three times a week    Frequency of Social Gatherings with Friends and Family: More than three times a week    Attends Religious Services: Never    Database Administrator or Organizations: No    Attends Banker Meetings: Never    Marital Status: Divorced  Catering Manager Violence: Patient Unable To Answer (07/22/2023)   Humiliation, Afraid, Rape, and Kick questionnaire    Fear of Current or Ex-Partner: Patient unable to answer    Emotionally Abused: Patient unable to answer    Physically Abused: Patient unable to answer    Sexually Abused: Patient unable to answer    FAMILY HISTORY: Family History  Problem Relation Age of Onset   Heart disease Mother    Hypertension Mother    COPD Mother        was a smoker   Thyroid  disease Mother    Hypertension Father    Hypertension Sister    Thyroid  disease Sister    Breast cancer Sister    Hypertension Sister    Thyroid  disease Sister    Bone cancer Sister    Stroke Son    Breast cancer Maternal Aunt    Kidney disease Neg Hx    Bladder Cancer Neg Hx     ALLERGIES:  is allergic to aspirin  and other.  MEDICATIONS:  Current Outpatient Medications  Medication Sig Dispense Refill   acetaminophen  (TYLENOL ) 325 MG tablet Take 325-650 mg by mouth every 6 (six) hours as needed for mild pain.     albuterol  (ACCUNEB ) 0.63 MG/3ML nebulizer solution Take 1 ampule by nebulization every 6 (six) hours as needed for wheezing.     apixaban  (ELIQUIS ) 5 MG TABS tablet Take 1 tablet (5 mg total) by mouth 2 (two) times daily. RESUME SUNDAY pm 180 tablet 3   atropine  1 % ophthalmic solution Place 1 drop into both eyes at bedtime.     cefadroxil  (DURICEF) 500 MG  capsule Take 1 capsule (  500 mg total) by mouth 2 (two) times daily. 60 capsule 12   dapagliflozin  propanediol (FARXIGA ) 10 MG TABS tablet Take 1 tablet (10 mg total) by mouth daily before breakfast. 90 tablet 1   gabapentin  (NEURONTIN ) 600 MG tablet Take 600 mg by mouth at bedtime.     lidocaine  (LIDODERM ) 5 % Place 2 patches onto the skin daily. Remove & Discard patch within 12 hours or as directed by MD 30 patch 0   macitentan  (OPSUMIT ) 10 MG tablet Take 1 tablet (10 mg total) by mouth daily. 30 tablet 11   meclizine  (ANTIVERT ) 25 MG tablet Take 1 tablet (25 mg total) by mouth 3 (three) times daily as needed for dizziness. 30 tablet 1   Menthol , Topical Analgesic, (BIOFREEZE EX) Apply 1 application topically 4 (four) times daily as needed (for pain).     oxyCODONE  (OXY IR/ROXICODONE ) 5 MG immediate release tablet Take 1 tablet (5 mg total) by mouth every 4 (four) hours as needed for moderate pain, breakthrough pain or severe pain. 12 tablet 0   potassium chloride  SA (KLOR-CON  M) 20 MEQ tablet Take 40meq every morning and 20mg  every evening. 60 tablet 1   rosuvastatin  (CRESTOR ) 10 MG tablet Take 1 tablet by mouth once daily 30 tablet 0   Selexipag  (UPTRAVI ) 1600 MCG TABS Take 1 tablet (1,600 mcg total) by mouth 2 (two) times daily. 60 tablet 11   sildenafil  (REVATIO ) 20 MG tablet Take 1 tablet (20 mg total) by mouth 3 (three) times daily. 90 tablet 3   spironolactone  (ALDACTONE ) 25 MG tablet Take 1 tablet by mouth daily.     torsemide  (DEMADEX ) 20 MG tablet Take 3 tablets (60 mg total) by mouth daily. 200 tablet 3   No current facility-administered medications for this visit.      PHYSICAL EXAMINATION: Vitals:   07/28/23 1315  Pulse: 81  Temp: (!) 97.1 F (36.2 C)  SpO2: 94%   Filed Weights   07/28/23 1315  Weight: 186 lb (84.4 kg)   Physical Exam Constitutional:      Comments: Patient in wheelchair.  Alone.  She is on O2 nasal cannula.  HENT:     Head: Normocephalic and  atraumatic.     Mouth/Throat:     Pharynx: No oropharyngeal exudate.  Eyes:     Pupils: Pupils are equal, round, and reactive to light.  Cardiovascular:     Rate and Rhythm: Normal rate and regular rhythm.  Pulmonary:     Effort: No respiratory distress.     Breath sounds: No wheezing.     Comments: Decreased air entry bilaterally.  No wheeze or crackles. Abdominal:     General: Bowel sounds are normal. There is no distension.     Palpations: Abdomen is soft. There is no mass.     Tenderness: There is no abdominal tenderness. There is no guarding or rebound.  Musculoskeletal:        General: No tenderness. Normal range of motion.     Cervical back: Normal range of motion and neck supple.  Skin:    General: Skin is warm.  Neurological:     Mental Status: She is alert and oriented to person, place, and time.  Psychiatric:        Mood and Affect: Affect normal.     LABORATORY DATA:  I have reviewed the data as listed Lab Results  Component Value Date   WBC 4.5 07/28/2023   HGB 10.2 (L) 07/28/2023   HCT 33.9 (L)  07/28/2023   MCV 85.8 07/28/2023   PLT 203 07/28/2023   Recent Labs    10/20/22 0423 10/21/22 0404 10/22/22 0143 11/18/22 1302 12/19/22 1236 07/20/23 0338 07/20/23 0944 07/20/23 0954 07/21/23 0837 07/28/23 1256  NA 141 138   < > 142   < > 141   < > 141 140 135  K 2.9* 3.9   < > 2.9*   < > 3.6   < > 3.5 3.9 4.6  CL 101 101   < > 104   < > 100  --   --  103 102  CO2 32 29   < > 28   < > 30  --   --  29 23  GLUCOSE 107* 120*   < > 114*   < > 105*  --   --  92 110*  BUN 23 19   < > 17   < > 16  --   --  10 19  CREATININE 0.96 0.94   < > 0.85   < > 0.88  --   --  0.68 1.10*  CALCIUM  8.7* 9.0   < > 8.5*   < > 8.9  --   --  9.1 8.9  GFRNONAA >60 >60   < > >60   < > >60  --   --  >60 51*  PROT 6.6 6.7  --  6.8  --   --   --   --   --   --   ALBUMIN  3.8 3.7  --  3.6  --   --   --   --   --   --   AST 14* 16  --  18  --   --   --   --   --   --   ALT 12 13  --   15  --   --   --   --   --   --   ALKPHOS 98 102  --  97  --   --   --   --   --   --   BILITOT 0.8 0.6  --  0.4  --   --   --   --   --   --   BILIDIR  --  0.1  --   --   --   --   --   --   --   --   IBILI  --  0.5  --   --   --   --   --   --   --   --    < > = values in this interval not displayed.  Iron /TIBC/Ferritin/ %Sat    Component Value Date/Time   IRON  48 03/24/2023 1257   IRON  43 08/29/2021 1037   IRON  25 (L) 02/04/2013 0931   TIBC 367 03/24/2023 1257   TIBC 308 08/29/2021 1037   TIBC 240 (L) 02/04/2013 0931   FERRITIN 480 (H) 03/24/2023 1257   FERRITIN 877 (H) 08/29/2021 1037   FERRITIN 655 (H) 02/11/2013 0409   IRONPCTSAT 13 03/24/2023 1257   IRONPCTSAT 14 (L) 08/29/2021 1037   IRONPCTSAT 23 01/15/2021 1457   Lab Results  Component Value Date   VITAMINB12 192 07/28/2023    CARDIAC CATHETERIZATION Result Date: 07/20/2023 1.  Near normal coronary arteries. 2.  Left ventricular angiography was not performed.  EF was normal by echo. 3.  Right heart catheterization showed normal filling pressures, moderate  pulmonary hypertension and normal cardiac output. RA: 5 mmHg RV: 47/2 mmHg PW: 13 mmHg PA: 54/20 with a mean of 29 mmHg Cardiac output is 4.32 with an index of 2.36. Recommendations: Continue medical therapy. Will switch IV furosemide  back to her dose of oral torsemide . Eliquis  to be resumed tonight. Continue treatment for pulmonary hypertension.   CUP PACEART INCLINIC DEVICE CHECK Result Date: 07/17/2023 Normal in-clinic Pacemaker check. Thresholds, sensing, and impedance WNL or stable for patient over time. Persis AFib since gen change with elevated Ventricular rates. Estimated longevity __13.3 years__. Pt enrolled in remote follow-up. CANDIE Needle, Debra Saunders  DG Chest 2 View Result Date: 07/17/2023 CLINICAL DATA:  Shortness of breath.  Chest pain. EXAM: CHEST - 2 VIEW COMPARISON:  10/24/2022. FINDINGS: Low lung volume. Redemonstration of increased interstitial markings  throughout bilateral lungs, which corresponds to chronic interstitial lung disease on the prior CT scan. No acute consolidation or lung collapse. Bilateral costophrenic angles are clear. Stable moderately enlarged cardio-mediastinal silhouette. There is a left sided 2-lead pacemaker. No acute osseous abnormalities. The soft tissues are within normal limits. IMPRESSION: *No active cardiopulmonary disease. Chronic interstitial lung disease. Electronically Signed   By: Ree Molt M.D.   On: 07/17/2023 16:23    Assessment & Plan:   # Chronic Symptomatic Anemia [BL10-11]-iron  deficiency/? CKD stage III  currently s/p Venofer . JULY 2024- ferritin 680 iron  sat 17%. On Gentle iron . Last received venofer  01/19/23. Received retacrit  03/24/23 for hemoglobin of 8.9.   # Hemoglobin 10.2. Improved. Iron  sat 13% October 2024. Proceed with venofer  today. Continue gentle iron , 1 pill daily.   # Etiology of anemia-unclear- CKD stage III  vs Others-[Dr.Lateef]/  S/p EGD/ capsule MAY 2024- NEG [hospital]; [place] [Dr.Russo]- MAY 2024 - NEG. [chronic diarrhea/malabsorption]-monitor closely.  If anemia is worsen, would consider bone marrow biopsy.     # Mild Hypokalemia- today- 4.5- continue Kdur BID;- stable.    # History of DVT- s/p IVC filter. A. Fib. On eliquis . Dr.Klein    # Borderline low B12-OCT 2023- 230; s/p B12 injection.B12 192 today. Proceed with b12 today and again monthly   # DISPOSITION: Venofer  & b12 today. Hold retacrit .  1 mo- b12 2 months- labs (cbc, bmp, ferritin, iron  studies, b12), Dr Rennie, +/- venofer  OR retacrit , b12- la  No problem-specific Assessment & Plan notes found for this encounter.  All questions were answered. The patient knows to call the clinic with any problems, questions or concerns.    Debra KANDICE Dawn, Debra Saunders 07/28/2023

## 2023-07-30 ENCOUNTER — Other Ambulatory Visit: Payer: Self-pay

## 2023-07-30 NOTE — Patient Outreach (Signed)
 Care Management  Transitions of Care Program Transitions of Care Post-discharge week 2   07/30/2023 Name: Debra Saunders MRN: [id number] DOB: 05/18/1946  Subjective: Debra Saunders is a 78 y.o. year old female who is a primary care patient of Maribeth, Camellia MATSU, MD. The Care Management team Engaged with patient Engaged with patient by telephone to assess and address transitions of care needs.   Consent to Services:  Patient was given information about care management services, agreed to services, and gave verbal consent to participate.   Assessment: TOC Outreach completed to the patient today. The patient states she is just getting up around 11:00am because she doesn't sleep well at night. Recommended to patient to discuss insomnia with her PCP at her appointment. Reviewed medications. Weight today is 185lbs. She wears oxygen  continuous at 4L/min and her O2 sat is 97%. She has a watch that monitors her saturation rate and heart rate. Reviewed her [employer] benefit regarding transportation to appointments if needed. She states her daughter takes her to appointments because the transportation service won't help her down the stairs with her oxygen . The patient also would like to go to the Dentist. Instructed her to call the number on the back of her insurance card to find out who is in network. RNCM to call next week for support and education as indicated.   SDOH Interventions    Flowsheet Row Patient Outreach from 07/30/2023 in [place] POPULATION HEALTH DEPARTMENT Telephone from 07/22/2023 in [place] POPULATION HEALTH DEPARTMENT Clinical Support from 03/09/2023 in [facility] at [facility] Visit from 11/18/2022 in [facility] - A Dept Of [place] ED to Hosp-Admission (Discharged) from 10/19/2022 in [facility] REGIONAL CARDIAC MED PCU Appointment from 08/19/2022 in [facility] - A Dept Of [place]. [facility]  SDOH Interventions        Food Insecurity Interventions Intervention Not Indicated Intervention Not Indicated Intervention Not Indicated -- -- --  Housing Interventions Intervention Not Indicated Intervention Not Indicated Intervention Not Indicated -- -- --  Transportation Interventions Intervention Not Indicated Intervention Not Indicated  [daughter Dedra provides all transportation] Intervention Not Indicated [facility] [place] ([facility]) Inpatient TOC, Other (Comment)  [Resources added to AVS.] [facility] [place] ([facility] Only)  Utilities Interventions Intervention Not Indicated Intervention Not Indicated Intervention Not Indicated -- -- --  Alcohol Usage Interventions Intervention Not Indicated (Score <7) -- Intervention Not Indicated (Score <7) -- -- --  Financial Strain Interventions Intervention Not Indicated -- Intervention Not Indicated -- -- --  Physical Activity Interventions Intervention Not Indicated -- Intervention Not Indicated -- -- --  Stress Interventions Intervention Not Indicated -- Intervention Not Indicated -- -- --  Social Connections Interventions Intervention Not Indicated -- Intervention Not Indicated -- -- --  Health Literacy Interventions Intervention Not Indicated -- -- -- -- --        Goals Addressed             This Visit's Progress    TOC 30-day Program Care Plan       Current Barriers: ( Reviewed 07/30/23) Provider appointments needs HFU appointment with PCP: her current PCP has left practice and she is scheduled for transfer of care with Dr. Hope on 08/27/23- however- needs prompt HFU with APP or other provider-- care coordination outreach with practice staff to facilitate Home Health services Confirmed ordered through [facility] on 07/21/23- ( HHPT cannot  start until the patient sees her PCP which is in March)  Equipment/DME daughter confirms has rollator walker and home O2 as per baseline prior to admission: denies additional DME  needs today    Resides alone; supportive daughter/ caregiver active in patient's care, manages all aspects of patient's medical affairs/ phone calls/ appointments; patient essentially independent in self-care at home and self- manages medications; uses walker all the time for ambulation; uses continuous home O2 at 4 L/min via [place] at baseline- prior to recent hospital admission Hospitalized January 26-28, 2025 for Acute on Chronic CHF/ Respiratory Failure  RNCM Clinical Goal(s): (Reviewed 07/30/23) Patient will work with the Care Management team over the next 30 days to address Transition of Care Barriers: Provider appointments Home Health services take all medications exactly as prescribed and will call provider for medication related questions as evidenced by caregiver reporting/ review of same during TOC 30-day RN CM weekly outreaches attend all scheduled medical appointments: 07/27/23- CHF provider; PCP HFU  as evidenced by review of same with patient's caregiver/ daughter and in EHR during [facility] 30-day RN CM weekly outreaches not experience hospital admission as evidenced by review of EMR. Hospital Admissions in last 6 months = 1  through collaboration with RN Care manager, provider, and care team.   Interventions: (Reviewed 07/30/23) Evaluation of current treatment plan related to  self management and patient's adherence to plan as established by provider  Transitions of Care:  New goal. (Reviewed 07/30/23) Durable Medical Equipment (DME) needs assessed with patient/caregiver Doctor Visits  - discussed the importance of doctor visits Communication with PCP scheduling staff re: scheduling of HFU office visit with APP, in light of established PCP no longer being at practice:  patient has current transfer of care appointment scheduled with Dr. Hope on 08/27/23; scheduling staff at [facility] advise for daughter to call to get HFU OV scheduled with APP Role of home health services with importance of  participation/ ongoing engagement: daughter has not yet heard from [facility]: provided phone number for agency, also provided my direct phone number should issues arise with initiation of services post-TOC call today ([facility] contacted the patient and cannot provide care until a F2F has been completed) Attempted medication review: daughter is not currently physically with patient to review: reports she and patient reviewed medications yesterday at time of hospital discharge; reports patient understood everything and had not questions, already updated her medications at home; encouraged caregiver to re-contact me after today's TOC call, if she discovers medication questions/ concerns, post-TOC call today Confirmed uses assistive devices on regular basis, at baseline -- rollator walker Confirms continuing to use home O2 as per hospital discharge instructions: as per baseline prior to admission: 4 L/[place]- daughter reports patient has been using home O2 for many years- 10-15 years Reinforced safe use/ maintenance of home O2 Confirmed caregiver aware of/ with plans to attend as scheduled: CHF provider office visit on 07/27/23- daughter confirms she will provide transportation Reinforced/ provided education around basics of following heart healthy low salt diet Successfully enrolled into 30-day TOC program- made TOC 30-day program RN CM aware of successful enrollment today  Patient Goals/Self-Care Activities: (Reviewed 07/30/23) Participate in Transition of Care Program/Attend TOC scheduled calls Take all medications as prescribed Attend all scheduled provider appointments Call provider office for new concerns or questions  Continue using home oxygen  as prescribed Use assistive devices (your walker) as needed to prevent falls Eat a heart healthy and low salt diet Please go over all of your medications  and any medication question/ concerns with your care providers (doctor) Work with the home health  team that is going to call you to schedule a visit to your home after your hospital discharge ([facility]:  [phone])  Follow Up Plan:  Telephone follow up appointment with care management team member scheduled for:  Thursday 08/06/23- with [place], RN CM          Plan: The patient has been provided with contact information for the care management team and has been advised to call with any health related questions or concerns.   Routine follow-up and on-going assessment evaluation and education of disease processes, and recommended interventions for both chronic and acute medical conditions, will occur during each weekly visit during [facility] 30-day Program Outreach calls along with ongoing review of symptoms, medication reviews and reconciliation. Any updates, inconsistencies, discrepancies or acute care concerns will be addressed on the Care Plan and routed to the correct Practitioner if indicated.    The patient has been provided with contact information for the care management team and has been advised to call with any health-related questions or concerns. The patient verbalized understanding with current POC. The patient is directed to their insurance card regarding availability of benefits coverage.  Medford Balboa, BSN, RN [place]  VBCI - [employer] Health RN Care Manager [phone]

## 2023-07-30 NOTE — Patient Instructions (Signed)
 Visit Information  Thank you for taking time to visit with me today. Please don't hesitate to contact me if I can be of assistance to you before our next scheduled telephone appointment.  Our next appointment is by telephone on Thursday February 13th at 11:00am  Following is a copy of your care plan:   Goals Addressed             This Visit's Progress    TOC 30-day Program Care Plan       Current Barriers: ( Reviewed 07/30/23) Provider appointments needs HFU appointment with PCP: her current PCP has left practice and she is scheduled for transfer of care with Dr. Hope on 08/27/23- however- needs prompt HFU with APP or other provider-- care coordination outreach with practice staff to facilitate Home Health services Confirmed ordered through [facility] on 07/21/23- ( HHPT cannot start until the patient sees her PCP which is in March)  Equipment/DME daughter confirms has rollator walker and home O2 as per baseline prior to admission: denies additional DME needs today    Resides alone; supportive daughter/ caregiver active in patient's care, manages all aspects of patient's medical affairs/ phone calls/ appointments; patient essentially independent in self-care at home and self- manages medications; uses walker all the time for ambulation; uses continuous home O2 at 4 L/min via [place] at baseline- prior to recent hospital admission Hospitalized January 26-28, 2025 for Acute on Chronic CHF/ Respiratory Failure  RNCM Clinical Goal(s): (Reviewed 07/30/23) Patient will work with the Care Management team over the next 30 days to address Transition of Care Barriers: Provider appointments Home Health services take all medications exactly as prescribed and will call provider for medication related questions as evidenced by caregiver reporting/ review of same during TOC 30-day RN CM weekly outreaches attend all scheduled medical appointments: 07/27/23- CHF provider; PCP HFU  as evidenced by review of same  with patient's caregiver/ daughter and in EHR during [facility] 30-day RN CM weekly outreaches not experience hospital admission as evidenced by review of EMR. Hospital Admissions in last 6 months = 1  through collaboration with RN Care manager, provider, and care team.   Interventions: (Reviewed 07/30/23) Evaluation of current treatment plan related to  self management and patient's adherence to plan as established by provider  Transitions of Care:  New goal. (Reviewed 07/30/23) Durable Medical Equipment (DME) needs assessed with patient/caregiver Doctor Visits  - discussed the importance of doctor visits Communication with PCP scheduling staff re: scheduling of HFU office visit with APP, in light of established PCP no longer being at practice:  patient has current transfer of care appointment scheduled with Dr. Hope on 08/27/23; scheduling staff at [facility] advise for daughter to call to get HFU OV scheduled with APP Role of home health services with importance of participation/ ongoing engagement: daughter has not yet heard from [facility]: provided phone number for agency, also provided my direct phone number should issues arise with initiation of services post-TOC call today ([facility] contacted the patient and cannot provide care until a F2F has been completed) Attempted medication review: daughter is not currently physically with patient to review: reports she and patient reviewed medications yesterday at time of hospital discharge; reports patient understood everything and had not questions, already updated her medications at home; encouraged caregiver to re-contact me after today's TOC call, if she discovers medication questions/ concerns, post-TOC call today Confirmed uses assistive devices on regular basis, at baseline -- rollator walker Confirms continuing to use home O2 as per  hospital discharge instructions: as per baseline prior to admission: 4 L/[place]- daughter reports patient has been using home  O2 for many years- 10-15 years Reinforced safe use/ maintenance of home O2 Confirmed caregiver aware of/ with plans to attend as scheduled: CHF provider office visit on 07/27/23- daughter confirms she will provide transportation Reinforced/ provided education around basics of following heart healthy low salt diet Successfully enrolled into 30-day TOC program- made TOC 30-day program RN CM aware of successful enrollment today  Patient Goals/Self-Care Activities: (Reviewed 07/30/23) Participate in Transition of Care Program/Attend [facility] scheduled calls Take all medications as prescribed Attend all scheduled provider appointments Call provider office for new concerns or questions  Continue using home oxygen  as prescribed Use assistive devices (your walker) as needed to prevent falls Eat a heart healthy and low salt diet Please go over all of your medications and any medication question/ concerns with your care providers (doctor) Work with the home health team that is going to call you to schedule a visit to your home after your hospital discharge ([facility]:  [phone])  Follow Up Plan:  Telephone follow up appointment with care management team member scheduled for:  Thursday 08/06/23- with [place], RN CM          Patient verbalizes understanding of instructions and care plan provided today and agrees to view in MyChart. Active MyChart status and patient understanding of how to access instructions and care plan via MyChart confirmed with patient.     The patient has been provided with contact information for the care management team and has been advised to call with any health related questions or concerns.   Please call the care guide team at [phone] if you need to cancel or reschedule your appointment.   Please call the Suicide and Crisis Lifeline: 988 call the [place]  National Suicide Prevention Lifeline: 867-147-3433 or TTY: 3254823436 TTY 272-021-4137) to talk to a trained  counselor if you are experiencing a Mental Health or Behavioral Health Crisis or need someone to talk to.  Medford Balboa, BSN, RN [place]  VBCI - [employer] Health RN Care Manager [phone]

## 2023-07-31 ENCOUNTER — Other Ambulatory Visit (HOSPITAL_COMMUNITY): Payer: Self-pay | Admitting: Cardiology

## 2023-07-31 ENCOUNTER — Encounter: Payer: Self-pay | Admitting: Internal Medicine

## 2023-07-31 DIAGNOSIS — E785 Hyperlipidemia, unspecified: Secondary | ICD-10-CM

## 2023-08-03 NOTE — Progress Notes (Deleted)
 Electrophysiology Clinic Note    Date:  08/03/2023  Patient ID:  Tareka, Jhaveri Apr 18, 1946, MRN [id number] PCP:  Glori Luis, MD  Cardiologist:  [name], MD HF cardiologist: Marca Ancona, MD Electrophysiologist: Sherryl Manges, MD    Discussed the use of AI scribe software for clinical note transcription with the patient, who gave verbal consent to proceed.   Patient Profile    Chief Complaint: PPM follow-up  History of Present Illness: ARWEN HASELEY is a 78 y.o. 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  She is s/p recent gen change complicated by post-op hematoma. I saw her 07/17/2023 where she c/o increased chest pain, SOB, and was in AFib, and I recommended eval in ER. She was not cardioverted during admission as there was concern she would not hold sinus without AAD. She saw Dr. Shirlee Latch 2/3, who recommended tikosyn.  On follow-up today, *** AF burden, symptoms *** palpitations *** bleeding concerns      Continues to take eliquis BID, no missed doses, no bleeding concerns.      Device Information: MDT dual chamber PPM, imp 12/2013; SND Previously had Bos sci generator  MDT Gen change 03/2023   AAD History: Amiodarone, stopped d/t concerns for ILD/pulm fibrosis     ROS:  Please see the history of present illness. All other systems are reviewed and otherwise negative.    Physical Exam    VS:  There were no vitals taken for this visit. BMI: There is no height or weight on file to calculate BMI.  Wt Readings from Last 3 Encounters:  07/28/23 186 lb (84.4 kg)  07/27/23 187 lb (84.8 kg)  07/21/23 192 lb 14.4 oz (87.5 kg)     GEN- The patient is well appearing, alert and oriented x 3 today.   Lungs- Clear to  ausculation bilaterally, normal work of breathing.  Heart- Regular rate and rhythm, no murmurs, rubs or gallops Extremities- 1-2+ peripheral edema, warm, dry Skin-  device pocket well-healed, no tethering   Device interrogation done today and reviewed by myself:  Battery 13.3 years Lead thresholds, impedence, sensing stable  Presents in AF, appears persistent Intermittently AP d/t lack of atrial sensing Reduced atrial sensing to 0.65mV Elevated ventricular rates    Studies Reviewed   Previous EP, cardiology notes.    EKG is ordered. Personal review of EKG from today shows:         RHC, 02/11/2022 1. Normal filling pressures.  2. Normal PA pressure.    R/LHC, 07/20/2023 1.  Near normal coronary arteries. 2.  Left ventricular angiography was not performed.  EF was normal by echo. 3.  Right heart catheterization showed normal filling pressures, moderate pulmonary hypertension and normal cardiac output.   RA: 5 mmHg RV: 47/2 mmHg PW: 13 mmHg PA: 54/20 with a mean of 29 mmHg Cardiac output is 4.32 with an index of 2.36.  TTE, 08/23/2021  1. Left ventricular ejection fraction, by estimation, is 60 to 65%. The left ventricle has normal function. The left ventricle has no regional wall motion abnormalities. There is mild left ventricular hypertrophy. Left ventricular diastolic parameters are consistent with Grade I diastolic dysfunction (impaired relaxation).   2. Right ventricular systolic function is normal. The right ventricular size is normal. There is mildly  elevated pulmonary artery systolic pressure. The estimated right ventricular systolic pressure is 40.3 mmHg.   3. Left atrial size was moderately dilated.   4. The mitral valve is normal in structure. No evidence of mitral valve regurgitation. No evidence of mitral stenosis.   5. Tricuspid valve regurgitation is moderate.   6. The aortic valve is normal in structure. Aortic valve regurgitation is not visualized. Aortic valve  sclerosis/calcification is present, without any evidence of aortic stenosis.   7. The inferior vena cava is normal in size with greater than 50% respiratory variability, suggesting right atrial pressure of 3 mmHg.   8. RV pacer wire difficult to visualize, grossly no vegetation noted.   Assessment and Plan    #) persis AFib    #) SND s/p PPM #) PPM device infection Device functioning well, see paceart for details     #) Hypercoag d/t afib CHA2DS2-VASc Score = 9 [CHF History: 1, HTN History: 1, Diabetes History: 1, Stroke History: 2, Vascular Disease History: 1, Age Score: 2, Gender Score: 1].  Therefore, the patient's annual risk of stroke is 12.2 %.    Stroke ppx - 5mg  eliquis BID, appropriately dosed No bleeding concerns       Current medicines are reviewed at length with the patient today.   The patient does not have concerns regarding her medicines.  The following changes were made today:  none  Labs/ tests ordered today include:  No orders of the defined types were placed in this encounter.    Disposition: Follow up with Dr. Graciela Husbands or EP APP in 6 months   Signed, Sherie Don, NP  08/03/23  6:18 PM  Electrophysiology [facility]

## 2023-08-04 ENCOUNTER — Ambulatory Visit: Payer: Medicare HMO | Admitting: Cardiology

## 2023-08-04 DIAGNOSIS — I495 Sick sinus syndrome: Secondary | ICD-10-CM

## 2023-08-04 DIAGNOSIS — D6869 Other thrombophilia: Secondary | ICD-10-CM

## 2023-08-04 DIAGNOSIS — Z950 Presence of cardiac pacemaker: Secondary | ICD-10-CM

## 2023-08-04 DIAGNOSIS — I4819 Other persistent atrial fibrillation: Secondary | ICD-10-CM

## 2023-08-05 NOTE — Progress Notes (Deleted)
 Electrophysiology Clinic Note    Date:  08/05/2023  Patient ID:  Debra Saunders, Debra Saunders [birth year], MRN [id number] PCP:  Glori Luis, MD  Cardiologist:  [name], MD HF cardiologist: Marca Ancona, MD Electrophysiologist: Sherryl Manges, MD    Discussed the use of AI scribe software for clinical note transcription with the patient, who gave verbal consent to proceed.   Patient Profile    Chief Complaint: PPM follow-up  History of Present Illness: Debra Saunders is a 78 y.o. 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  She is s/p recent gen change complicated by post-op hematoma. I saw her 07/17/2023 where she c/o increased chest pain, SOB, and was in AFib, and I recommended eval in ER. She was not cardioverted during admission as there was concern she would not hold sinus without AAD. She saw Dr. Shirlee Latch 2/3, who recommended tikosyn.  On follow-up today, *** AF burden, symptoms *** palpitations *** bleeding concerns      Continues to take eliquis BID, no missed doses, no bleeding concerns.      Device Information: MDT dual chamber PPM, imp 12/2013; SND Previously had Bos sci generator  MDT Gen change 03/2023   AAD History: Amiodarone, stopped d/t concerns for ILD/pulm fibrosis     ROS:  Please see the history of present illness. All other systems are reviewed and otherwise negative.    Physical Exam    VS:  There were no vitals taken for this visit. BMI: There is no height or weight on file to calculate BMI.  Wt Readings from Last 3 Encounters:  07/28/23 186 lb (84.4 kg)  07/27/23 187 lb (84.8 kg)  07/21/23 192 lb 14.4 oz (87.5 kg)     GEN- The patient is well appearing, alert and oriented x 3 today.   Lungs- Clear to  ausculation bilaterally, normal work of breathing.  Heart- Regular rate and rhythm, no murmurs, rubs or gallops Extremities- 1-2+ peripheral edema, warm, dry Skin-  device pocket well-healed, no tethering   Device interrogation done today and reviewed by myself:  Battery 13.3 years Lead thresholds, impedence, sensing stable  Presents in AF, appears persistent Intermittently AP d/t lack of atrial sensing Reduced atrial sensing to 0.57mV Elevated ventricular rates    Studies Reviewed   Previous EP, cardiology notes.    EKG is ordered. Personal review of EKG from today shows:         RHC, 02/11/2022 1. Normal filling pressures.  2. Normal PA pressure.    R/LHC, 07/20/2023 1.  Near normal coronary arteries. 2.  Left ventricular angiography was not performed.  EF was normal by echo. 3.  Right heart catheterization showed normal filling pressures, moderate pulmonary hypertension and normal cardiac output.   RA: 5 mmHg RV: 47/2 mmHg PW: 13 mmHg PA: 54/20 with a mean of 29 mmHg Cardiac output is 4.32 with an index of 2.36.  TTE, 08/23/2021  1. Left ventricular ejection fraction, by estimation, is 60 to 65%. The left ventricle has normal function. The left ventricle has no regional wall motion abnormalities. There is mild left ventricular hypertrophy. Left ventricular diastolic parameters are consistent with Grade I diastolic dysfunction (impaired relaxation).   2. Right ventricular systolic function is normal. The right ventricular size is normal. There is mildly  elevated pulmonary artery systolic pressure. The estimated right ventricular systolic pressure is 40.3 mmHg.   3. Left atrial size was moderately dilated.   4. The mitral valve is normal in structure. No evidence of mitral valve regurgitation. No evidence of mitral stenosis.   5. Tricuspid valve regurgitation is moderate.   6. The aortic valve is normal in structure. Aortic valve regurgitation is not visualized. Aortic valve  sclerosis/calcification is present, without any evidence of aortic stenosis.   7. The inferior vena cava is normal in size with greater than 50% respiratory variability, suggesting right atrial pressure of 3 mmHg.   8. RV pacer wire difficult to visualize, grossly no vegetation noted.   Assessment and Plan    #) persis AFib    #) SND s/p PPM #) PPM device infection Device functioning well, see paceart for details     #) Hypercoag d/t afib CHA2DS2-VASc Score = 9 [CHF History: 1, HTN History: 1, Diabetes History: 1, Stroke History: 2, Vascular Disease History: 1, Age Score: 2, Gender Score: 1].  Therefore, the patient's annual risk of stroke is 12.2 %.    Stroke ppx - 5mg  eliquis BID, appropriately dosed No bleeding concerns       Current medicines are reviewed at length with the patient today.   The patient does not have concerns regarding her medicines.  The following changes were made today:  none  Labs/ tests ordered today include:  No orders of the defined types were placed in this encounter.    Disposition: Follow up with Dr. Graciela Husbands or EP APP in 6 months   Signed, Sherie Don, NP  08/05/23  9:12 AM  Electrophysiology [facility]

## 2023-08-06 ENCOUNTER — Other Ambulatory Visit: Payer: Self-pay

## 2023-08-06 NOTE — Patient Instructions (Signed)
Visit Information  Thank you for taking time to visit with me today. Please don't hesitate to contact me if I can be of assistance to you before our next scheduled telephone appointment.  Our next appointment is by telephone on Friday February 21st at 11am  Following is a copy of your care plan:   Goals Addressed             This Visit's Progress    TOC 30-day Program Care Plan   On track    Current Barriers: ( Reviewed 08/06/23) Provider appointments needs HFU appointment with PCP: her current PCP has left practice and she is scheduled for transfer of care with Dr. Clent Ridges on 08/27/23- however- needs prompt HFU with APP or other provider-- care coordination outreach with practice staff to facilitate Home Health services Confirmed ordered through [facility] on 07/21/23- ( HHPT cannot start until the patient sees her PCP which is in March)  Equipment/DME daughter confirms has rollator walker and home O2 as per baseline prior to admission: denies additional DME needs today    Resides alone; supportive daughter/ caregiver active in patient's care, manages all aspects of patient's medical affairs/ phone calls/ appointments; patient essentially independent in self-care at home and self- manages medications; uses walker "all the time" for ambulation; uses continuous home O2 at 4 L/min via [place] at baseline- prior to recent hospital admission  RNCM Clinical Goal(s): (Reviewed 08/06/23) Patient will work with the Care Management team over the next 30 days to address Transition of Care Barriers: Provider appointments take all medications exactly as prescribed and will call provider for medication related questions as evidenced by caregiver reporting/ review of same during TOC 30-day RN CM weekly outreaches attend all scheduled medical appointments:  PCP HFU  as evidenced by review of same with patient's caregiver/ daughter and in EHR during [facility] 30-day RN CM weekly outreaches not experience hospital  admission as evidenced by review of EMR. Hospital Admissions in last 6 months = 1  through collaboration with RN Care manager, provider, and care team.   Interventions: (Reviewed 08/06/23) Evaluation of current treatment plan related to  self management and patient's adherence to plan as established by provider  Transitions of Care:  Goal on track:  Yes. (Reviewed 08/06/23) Durable Medical Equipment (DME) needs assessed with patient/caregiver Doctor Visits  - discussed the importance of doctor visits Role of home health services with importance of participation/ ongoing engagement: daughter has not yet heard from [facility]: provided phone number for agency, also provided my direct phone number should issues arise with initiation of services post-TOC call today ([facility] contacted the patient and cannot provide care until a F2F has been completed) Attempted medication review: daughter is not currently physically with patient to review: reports she and patient reviewed medications yesterday at time of hospital discharge; reports patient "understood everything and had not questions, already updated her medications at home;" encouraged caregiver to re-contact me after today's TOC call, if she discovers medication questions/ concerns, post-TOC call today Confirmed uses assistive devices on regular basis, at baseline -- rollator walker Confirms continuing to use home O2 as per hospital discharge instructions: as per baseline prior to admission: 4 L/[place]- daughter reports patient has been using home O2 "for many years- 10-15 years" Reinforced safe use/ maintenance of home O2 Reinforced/ provided education around basics of following heart healthy low salt diet Successfully enrolled into 30-day TOC program- made TOC 30-day program RN CM aware of successful enrollment today  Patient Goals/Self-Care Activities: (Reviewed  08/06/23) Participate in Transition of Care Program/Attend TOC scheduled calls Take all  medications as prescribed Attend all scheduled provider appointments Call provider office for new concerns or questions  Continue using home oxygen as prescribed Use assistive devices (your walker) as needed to prevent falls Eat a heart healthy and low salt diet Please go over all of your medications and any medication question/ concerns with your care providers (doctor)  Follow Up Plan:  Telephone follow up appointment with care management team member scheduled for:  Friday 08/14/23- with Thayer Ohm, RN CM          Patient verbalizes understanding of instructions and care plan provided today and agrees to view in MyChart. Active MyChart status and patient understanding of how to access instructions and care plan via MyChart confirmed with patient.     The patient has been provided with contact information for the care management team and has been advised to call with any health related questions or concerns.   Please call the care guide team at [phone] if you need to cancel or reschedule your appointment.   Please call the Suicide and Crisis Lifeline: 988 call the [place] National Suicide Prevention Lifeline: 450-750-2984 or TTY: 6783242469 TTY 831-535-2759) to talk to a trained counselor if you are experiencing a Mental Health or Behavioral Health Crisis or need someone to talk to.  Deidre Ala, BSN, RN [place]  VBCI - [employer] [facility] RN Care Manager [phone]

## 2023-08-06 NOTE — Patient Outreach (Signed)
Care Management  Transitions of Care Program Transitions of Care Post-discharge week 3   08/06/2023 Name: Debra Saunders MRN: [id number] DOB: [birth year]  Subjective: Debra Saunders is a 78 y.o. year old female who is a primary care patient of Birdie Sons, Yehuda Mao, MD. The Care Management team Engaged with patient Engaged with patient by telephone to assess and address transitions of care needs.   Consent to Services:  Patient was given information about care management services, agreed to services, and gave verbal consent to participate.   Assessment: TOC Outreach to the patient today. She was just getting up. States she has a hard time sleeping. Reviewed medications to assess if there was cause and effect. The patient does not have her Rosuvastatin and RNCM instructed her to contact her cardiologist for refill. She states her weight is stable at 185lb. She continues to wear her oxygen at 4L continuous. She does state she has decrease stamina but realizes this could be a progression of her disease. She feels safe at home and has not fallen. She states her daughter calls her multiple times a day to check on her. RNCM to follow up next week after her provider appointment.    SDOH Interventions    Flowsheet Row Patient Outreach from 08/06/2023 in [place] POPULATION HEALTH DEPARTMENT Patient Outreach from 07/30/2023 in [name] POPULATION HEALTH DEPARTMENT Telephone from 07/22/2023 in [place] POPULATION HEALTH DEPARTMENT Clinical Support from 03/09/2023 in [name] Visit from 11/18/2022 in [facility] Dept Of [place] ED to Hosp-Admission (Discharged) from 10/19/2022 in [facility] CARDIAC MED PCU  SDOH Interventions        Food Insecurity Interventions Intervention Not Indicated Intervention Not Indicated Intervention Not Indicated Intervention Not Indicated -- --  Housing Interventions  Intervention Not Indicated Intervention Not Indicated Intervention Not Indicated Intervention Not Indicated -- --  Transportation Interventions Intervention Not Indicated Intervention Not Indicated Intervention Not Indicated  [daughter [name] provides all transportation] Intervention Not Indicated CCAR Zenaida Niece ([facility] Only) Inpatient TOC, Other (Comment)  [Resources added to AVS.]  Utilities Interventions Intervention Not Indicated Intervention Not Indicated Intervention Not Indicated Intervention Not Indicated -- --  Alcohol Usage Interventions -- Intervention Not Indicated (Score <7) -- Intervention Not Indicated (Score <7) -- --  Financial Strain Interventions -- Intervention Not Indicated -- Intervention Not Indicated -- --  Physical Activity Interventions -- Intervention Not Indicated -- Intervention Not Indicated -- --  Stress Interventions -- Intervention Not Indicated -- Intervention Not Indicated -- --  Social Connections Interventions -- Intervention Not Indicated -- Intervention Not Indicated -- --  Health Literacy Interventions -- Intervention Not Indicated -- -- -- --        Goals Addressed             This Visit's Progress    TOC 30-day Program Care Plan   On track    Current Barriers: ( Reviewed 08/06/23) Provider appointments needs HFU appointment with PCP: her current PCP has left practice and she is scheduled for transfer of care with Dr. Clent Ridges on 08/27/23- however- needs prompt HFU with APP or other provider-- care coordination outreach with practice staff to facilitate Home Health services Confirmed ordered through [facility] on 07/21/23- ( HHPT cannot start until the patient sees her PCP which is in March)  Equipment/DME daughter confirms has rollator walker and home O2 as per baseline prior to admission: denies additional DME  needs today    Resides alone; supportive daughter/ caregiver active in patient's care, manages all aspects of patient's medical affairs/  phone calls/ appointments; patient essentially independent in self-care at home and self- manages medications; uses walker "all the time" for ambulation; uses continuous home O2 at 4 L/min via [place] at baseline- prior to recent hospital admission  RNCM Clinical Goal(s): (Reviewed 08/06/23) Patient will work with the Care Management team over the next 30 days to address Transition of Care Barriers: Provider appointments take all medications exactly as prescribed and will call provider for medication related questions as evidenced by caregiver reporting/ review of same during TOC 30-day RN CM weekly outreaches attend all scheduled medical appointments:  PCP HFU  as evidenced by review of same with patient's caregiver/ daughter and in EHR during [facility] 30-day RN CM weekly outreaches not experience hospital admission as evidenced by review of EMR. Hospital Admissions in last 6 months = 1  through collaboration with RN Care manager, provider, and care team.   Interventions: (Reviewed 08/06/23) Evaluation of current treatment plan related to  self management and patient's adherence to plan as established by provider  Transitions of Care:  Goal on track:  Yes. (Reviewed 08/06/23) Durable Medical Equipment (DME) needs assessed with patient/caregiver Doctor Visits  - discussed the importance of doctor visits Role of home health services with importance of participation/ ongoing engagement: daughter has not yet heard from [facility]: provided phone number for agency, also provided my direct phone number should issues arise with initiation of services post-TOC call today ([facility] contacted the patient and cannot provide care until a F2F has been completed) Attempted medication review: daughter is not currently physically with patient to review: reports she and patient reviewed medications yesterday at time of hospital discharge; reports patient "understood everything and had not questions, already updated her  medications at home;" encouraged caregiver to re-contact me after today's TOC call, if she discovers medication questions/ concerns, post-TOC call today Confirmed uses assistive devices on regular basis, at baseline -- rollator walker Confirms continuing to use home O2 as per hospital discharge instructions: as per baseline prior to admission: 4 L/[place]- daughter reports patient has been using home O2 "for many years- 10-15 years" Reinforced safe use/ maintenance of home O2 Reinforced/ provided education around basics of following heart healthy low salt diet Successfully enrolled into 30-day TOC program- made TOC 30-day program RN CM aware of successful enrollment today  Patient Goals/Self-Care Activities: (Reviewed 08/06/23) Participate in Transition of Care Program/Attend [facility] scheduled calls Take all medications as prescribed Attend all scheduled provider appointments Call provider office for new concerns or questions  Continue using home oxygen as prescribed Use assistive devices (your walker) as needed to prevent falls Eat a heart healthy and low salt diet Please go over all of your medications and any medication question/ concerns with your care providers (doctor)  Follow Up Plan:  Telephone follow up appointment with care management team member scheduled for:  Friday 08/14/23- with Thayer Ohm, RN CM          Plan: The patient has been provided with contact information for the care management team and has been advised to call with any health related questions or concerns.   Routine follow-up and on-going assessment evaluation and education of disease processes, and recommended interventions for both chronic and acute medical conditions, will occur during each weekly visit during [facility] 30-day Program Outreach calls along with ongoing review of symptoms, medication reviews and reconciliation. Any updates, inconsistencies, discrepancies  or acute care concerns will be addressed on the Care Plan and  routed to the correct Practitioner if indicated.    The patient has been provided with contact information for the care management team and has been advised to call with any health-related questions or concerns. The patient verbalized understanding with current POC. The patient is directed to their insurance card regarding availability of benefits coverage.  Deidre Ala, BSN, RN [place]  VBCI - [employer] [facility] RN Care Manager [phone]

## 2023-08-11 ENCOUNTER — Inpatient Hospital Stay: Payer: Medicare HMO

## 2023-08-13 ENCOUNTER — Ambulatory Visit: Payer: Medicare HMO | Admitting: Cardiology

## 2023-08-13 DIAGNOSIS — I4819 Other persistent atrial fibrillation: Secondary | ICD-10-CM

## 2023-08-13 DIAGNOSIS — D6869 Other thrombophilia: Secondary | ICD-10-CM

## 2023-08-13 DIAGNOSIS — Z950 Presence of cardiac pacemaker: Secondary | ICD-10-CM

## 2023-08-13 DIAGNOSIS — I495 Sick sinus syndrome: Secondary | ICD-10-CM

## 2023-08-13 NOTE — Progress Notes (Unsigned)
 Electrophysiology Clinic Note    Date:  08/18/2023  Patient ID:  Debra, Saunders 07-Jul-1945, MRN [id number] PCP:  Glori Luis, MD  Cardiologist:  [name], MD HF cardiologist: Marca Ancona, MD Electrophysiologist: Sherryl Manges, MD    Discussed the use of AI scribe software for clinical note transcription with the patient, who gave verbal consent to proceed.   Patient Profile    Chief Complaint: PPM follow-up  History of Present Illness: Debra Saunders is a 78 y.o. 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  She is s/p recent gen change complicated by post-op hematoma. I saw her 07/17/2023 where she c/o increased chest pain, SOB, and was in AFib, and I recommended eval in ER. She was not cardioverted during admission as there was concern she would not hold sinus without AAD. She saw Dr. Shirlee Latch 2/3, who recommended tikosyn.  On follow-up today, she continues to have SOB, but it is improving since our last visit. Her L leg becomes edematous throughout the day. She says when she wakes up in the morning, both legs are skinny and the L leg slowly increases in size throughout the day. She is up and active throughout the day, but has to take frequent breaks. She thinks her SOB has worsened since she has been in AFib. She does have intermittent palpitations.  She continue to take eliquis BID, no missed doses, no bleeding concerns.   She is joined by her daughter, whom I have previously met.      Device Information: MDT dual chamber PPM, imp 12/2013; SND Previously had Bos sci generator  MDT Gen change 03/2023    AAD History: Amiodarone, stopped d/t concerns for ILD/pulm fibrosis     ROS:  Please see the history of present illness. All other  systems are reviewed and otherwise negative.    Physical Exam    VS:  BP (!) 100/54 (BP Location: Left Arm, Patient Position: Sitting, Cuff Size: Large)   Pulse (!) 56   Ht 5' (1.524 m)   Wt 192 lb (87.1 kg)   SpO2 96%   BMI 37.50 kg/m  BMI: Body mass index is 37.5 kg/m.  Wt Readings from Last 3 Encounters:  08/18/23 192 lb (87.1 kg)  07/28/23 186 lb (84.4 kg)  07/27/23 187 lb (84.8 kg)     GEN- The patient is well appearing, alert and oriented x 3 today.   Lungs- Clear to ausculation bilaterally, normal work of breathing, [place] in place Heart- Regular rate and rhythm, no murmurs, rubs or gallops Extremities- 1-2+ peripheral LLL edema, no edema to RLL, warm, dry Skin-  device pocket well-healed, no tethering   Device interrogation done today and reviewed by myself:  Battery 13.2 years Lead thresholds, impedence, sensing stable  Presents in AF, appears persistent Dependent to VVI 40 today No changes made   Studies Reviewed   Previous EP, cardiology notes.    EKG is ordered. Personal review of EKG from today shows:    EKG Interpretation Date/Time:  Tuesday August 18 2023 14:17:35 EST Ventricular Rate:  56 PR Interval:    QRS Duration:  174 QT Interval:  480 QTC Calculation: 463 R Axis:   -76  Text Interpretation: Ventricular-paced rhythm Confirmed by Sherie Don [phone]) on 08/18/2023 3:18:38 PM  R/LHC, 07/20/2023 1.  Near normal coronary arteries. 2.  Left ventricular angiography was not performed.  EF was normal by echo. 3.  Right heart catheterization showed normal filling pressures, moderate pulmonary hypertension and normal cardiac output.   RA: 5 mmHg RV: 47/2 mmHg PW: 13 mmHg PA: 54/20 with a mean of 29 mmHg Cardiac output is 4.32 with an index of 2.36  RHC, 02/11/2022 1. Normal filling pressures.  2. Normal PA pressure.   TTE, 08/23/2021  1. Left ventricular ejection fraction, by estimation, is 60 to 65%. The left ventricle has normal  function. The left ventricle has no regional wall motion abnormalities. There is mild left ventricular hypertrophy. Left ventricular diastolic parameters are consistent with Grade I diastolic dysfunction (impaired relaxation).   2. Right ventricular systolic function is normal. The right ventricular size is normal. There is mildly elevated pulmonary artery systolic pressure. The estimated right ventricular systolic pressure is 40.3 mmHg.   3. Left atrial size was moderately dilated.   4. The mitral valve is normal in structure. No evidence of mitral valve regurgitation. No evidence of mitral stenosis.   5. Tricuspid valve regurgitation is moderate.   6. The aortic valve is normal in structure. Aortic valve regurgitation is not visualized. Aortic valve sclerosis/calcification is present, without any evidence of aortic stenosis.   7. The inferior vena cava is normal in size with greater than 50% respiratory variability, suggesting right atrial pressure of 3 mmHg.   8. RV pacer wire difficult to visualize, grossly no vegetation noted.   Assessment and Plan    #) persis AFib #) moderate pulm HTN AAD options limited to tikosyn Discussed tikosyn loading process including 3 day admission to [facility] hospital  Patient and daughter agreeable to this Will send msg to AF clinic to being process  #) SND s/p PPM #) PPM device infection Device functioning well, see paceart for details     #) Hypercoag d/t afib CHA2DS2-VASc Score = 9 [CHF History: 1, HTN History: 1, Diabetes History: 1, Stroke History: 2, Vascular Disease History: 1, Age Score: 2, Gender Score: 1].  Therefore, the patient's annual risk of stroke is 12.2 %.    Stroke ppx - 5mg  eliquis BID, appropriately dosed No bleeding concerns       Current medicines are reviewed at length with the patient today.   The patient does not have concerns regarding her medicines.  The following changes were made today:  none  Labs/ tests ordered today  include:  Orders Placed This Encounter  Procedures   EKG 12-Lead     Disposition: Follow up with Dr. Graciela Husbands or EP APP in 6 months, or sooner based on tikosyn admission     Signed, Sherie Don, NP  08/18/23  3:20 PM  Electrophysiology [facility]

## 2023-08-14 ENCOUNTER — Other Ambulatory Visit: Payer: Self-pay

## 2023-08-14 NOTE — Patient Instructions (Signed)
Visit Information  Thank you for taking time to visit with me today. Please don't hesitate to contact me if I can be of assistance to you before our next scheduled telephone appointment.  Our next appointment is by telephone on Wednesday February 26th at 11am  Following is a copy of your care plan:   Goals Addressed             This Visit's Progress    TOC 30-day Program Care Plan   On track    Current Barriers: ( Reviewed 08/14/23) Provider appointments needs HFU appointment with PCP: her current PCP has left practice and she is scheduled for transfer of care with Dr. Clent Ridges on 08/27/23 Home Health services Confirmed ordered through [facility] on 07/21/23- ( HHPT cannot start until the patient sees her PCP which is in March)    Resides alone; supportive daughter/ caregiver active in patient's care, manages all aspects of patient's medical affairs/ phone calls/ appointments; patient essentially independent in self-care at home and self- manages medications; uses walker "all the time" for ambulation; uses continuous home O2 at 4 L/min via [place] at baseline- prior to recent hospital admission  RNCM Clinical Goal(s): (Reviewed 08/14/23) Patient will work with the Care Management team over the next 30 days to address Transition of Care Barriers: Provider appointments take all medications exactly as prescribed and will call provider for medication related questions as evidenced by caregiver reporting/ review of same during TOC 30-day RN CM weekly outreaches attend all scheduled medical appointments:  PCP HFU  as evidenced by review of same with patient's caregiver/ daughter and in EHR during [facility] 30-day RN CM weekly outreaches not experience hospital admission as evidenced by review of EMR. Hospital Admissions in last 6 months = 1  through collaboration with RN Care manager, provider, and care team.   Interventions: (Reviewed 08/14/23) Evaluation of current treatment plan related to  self management  and patient's adherence to plan as established by provider  Transitions of Care:  Goal on track:  Yes. (Reviewed 08/14/23) Doctor Visits  - discussed the importance of doctor visits Role of home health services with importance of participation/ ongoing engagement: daughter has not yet heard from [facility]: provided phone number for agency, also provided my direct phone number should issues arise with initiation of services post-TOC call today ([facility] contacted the patient and cannot provide care until a F2F has been completed Confirmed uses assistive devices on regular basis, at baseline -- rollator walker Confirms continuing to use home O2 as per hospital discharge instructions: as per baseline prior to admission: 4 L/[place]- daughter reports patient has been using home O2 "for many years- 10-15 years" Reinforced safe use/ maintenance of home O2 Reinforced/ provided education around basics of following heart healthy low salt diet  Patient Goals/Self-Care Activities: (Reviewed 08/14/23) Participate in Transition of Care Program/Attend [facility] scheduled calls Take all medications as prescribed Attend all scheduled provider appointments Call provider office for new concerns or questions  Continue using home oxygen as prescribed Use assistive devices (your walker) as needed to prevent falls Eat a heart healthy and low salt diet Please go over all of your medications and any medication question/ concerns with your care providers (doctor)  Follow Up Plan:  Telephone follow up appointment with care management team member scheduled for:  Wednesday 08/19/23- with Thayer Ohm, RN CM          Patient verbalizes understanding of instructions and care plan provided today and agrees to view in MyChart. Active  MyChart status and patient understanding of how to access instructions and care plan via MyChart confirmed with patient.     The patient has been provided with contact information for the care management  team and has been advised to call with any health related questions or concerns.   Please call the care guide team at [phone] if you need to cancel or reschedule your appointment.   Please call the Suicide and Crisis Lifeline: 988 call the [place] National Suicide Prevention Lifeline: 206-626-9905 or TTY: (313)495-1610 TTY (905)104-1733) to talk to a trained counselor if you are experiencing a Mental Health or Behavioral Health Crisis or need someone to talk to.  Deidre Ala, BSN, RN [place]  VBCI - [employer] Health RN Care Manager [phone]

## 2023-08-14 NOTE — Patient Outreach (Signed)
Care Management  Transitions of Care Program Transitions of Care Post-discharge week 4   08/14/2023 Name: Debra Saunders MRN: [id number] DOB: [birth year]  Subjective: Debra Saunders is a 78 y.o. year old female who is a primary care patient of Birdie Sons, Yehuda Mao, MD. The Care Management team Engaged with patient Engaged with patient by telephone to assess and address transitions of care needs.   Consent to Services:  Patient was given information about care management services, agreed to services, and gave verbal consent to participate.   Assessment: TOC Outreach today. The patient states she is doing well with her CHF but she can't lose weight. She weighted 187lbs today which is within 1 pound of last weight. She continues with her oxygen at 4L/min and states her sats are around 97%. She is not sleeping well and it is now noted in her upcoming provider appointment. She states she has contacted Dr. Shirlee Latch several times and the pharmacy as well regarding her Rosuvastatin. She has an appointment with that provider on 08/17/24 and will discuss with him at that time. She is working with HHPT. RNCM to reach out after Cardiology appointment next week and the transfer to [facility] program if she is agreeable.     SDOH Interventions    Flowsheet Row Patient Outreach from 08/06/2023 in [place] POPULATION HEALTH DEPARTMENT Patient Outreach from 07/30/2023 in [place] POPULATION HEALTH DEPARTMENT Telephone from 07/22/2023 in [place] POPULATION HEALTH DEPARTMENT Clinical Support from 03/09/2023 in [facility] [place] [facility] Visit from 11/18/2022 in [facility] Dept Of [place] ED to Hosp-Admission (Discharged) from 10/19/2022 in [facility]  SDOH Interventions        Food Insecurity Interventions Intervention Not Indicated Intervention Not Indicated Intervention Not Indicated Intervention Not Indicated  -- --  Housing Interventions Intervention Not Indicated Intervention Not Indicated Intervention Not Indicated Intervention Not Indicated -- --  Transportation Interventions Intervention Not Indicated Intervention Not Indicated Intervention Not Indicated  [daughter [name] provides all transportation] Intervention Not Indicated [facility] Zenaida Niece ([facility] Only) Inpatient TOC, Other (Comment)  [Resources added to AVS.]  Utilities Interventions Intervention Not Indicated Intervention Not Indicated Intervention Not Indicated Intervention Not Indicated -- --  Alcohol Usage Interventions -- Intervention Not Indicated (Score <7) -- Intervention Not Indicated (Score <7) -- --  Financial Strain Interventions -- Intervention Not Indicated -- Intervention Not Indicated -- --  Physical Activity Interventions -- Intervention Not Indicated -- Intervention Not Indicated -- --  Stress Interventions -- Intervention Not Indicated -- Intervention Not Indicated -- --  Social Connections Interventions -- Intervention Not Indicated -- Intervention Not Indicated -- --  Health Literacy Interventions -- Intervention Not Indicated -- -- -- --        Goals Addressed             This Visit's Progress    TOC 30-day Program Care Plan   On track    Current Barriers: ( Reviewed 08/14/23) Provider appointments needs HFU appointment with PCP: her current PCP has left practice and she is scheduled for transfer of care with Dr. Clent Ridges on 08/27/23 Home Health services Confirmed ordered through [facility] on 07/21/23- ( HHPT cannot start until the patient sees her PCP which is in March)    Resides alone; supportive daughter/ caregiver active in patient's care, manages all aspects of patient's medical affairs/ phone calls/ appointments; patient essentially independent in self-care at home  and self- manages medications; uses walker "all the time" for ambulation; uses continuous home O2 at 4 L/min via [place] at baseline- prior to  recent hospital admission  RNCM Clinical Goal(s): (Reviewed 08/14/23) Patient will work with the Care Management team over the next 30 days to address Transition of Care Barriers: Provider appointments take all medications exactly as prescribed and will call provider for medication related questions as evidenced by caregiver reporting/ review of same during TOC 30-day RN CM weekly outreaches attend all scheduled medical appointments:  PCP HFU  as evidenced by review of same with patient's caregiver/ daughter and in EHR during [facility] 30-day RN CM weekly outreaches not experience hospital admission as evidenced by review of EMR. Hospital Admissions in last 6 months = 1  through collaboration with RN Care manager, provider, and care team.   Interventions: (Reviewed 08/14/23) Evaluation of current treatment plan related to  self management and patient's adherence to plan as established by provider  Transitions of Care:  Goal on track:  Yes. (Reviewed 08/14/23) Doctor Visits  - discussed the importance of doctor visits Role of home health services with importance of participation/ ongoing engagement: daughter has not yet heard from [facility]: provided phone number for agency, also provided my direct phone number should issues arise with initiation of services post-TOC call today ([facility] contacted the patient and cannot provide care until a F2F has been completed Confirmed uses assistive devices on regular basis, at baseline -- rollator walker Confirms continuing to use home O2 as per hospital discharge instructions: as per baseline prior to admission: 4 L/[place]- daughter reports patient has been using home O2 "for many years- 10-15 years" Reinforced safe use/ maintenance of home O2 Reinforced/ provided education around basics of following heart healthy low salt diet  Patient Goals/Self-Care Activities: (Reviewed 08/14/23) Participate in Transition of Care Program/Attend TOC scheduled calls Take all  medications as prescribed Attend all scheduled provider appointments Call provider office for new concerns or questions  Continue using home oxygen as prescribed Use assistive devices (your walker) as needed to prevent falls Eat a heart healthy and low salt diet Please go over all of your medications and any medication question/ concerns with your care providers (doctor)  Follow Up Plan:  Telephone follow up appointment with care management team member scheduled for:  Wednesday 08/19/23- with Thayer Ohm, RN CM          Plan: The patient has been provided with contact information for the care management team and has been advised to call with any health related questions or concerns.   Please refer to Care Plan for goals and interventions.  Patient educated on red flags signs/symptoms to watch for and was encouraged to report any of these identified, any new symptoms, changes in baseline or medication regimen, change in health status / well-being, or safety concerns to PCP and / or the VBCI Case Management team.   Deidre Ala, BSN, RN [place]  VBCI - [facility] Health RN Care Manager [phone]

## 2023-08-17 ENCOUNTER — Other Ambulatory Visit (HOSPITAL_COMMUNITY): Payer: Self-pay | Admitting: Cardiology

## 2023-08-17 ENCOUNTER — Other Ambulatory Visit (HOSPITAL_COMMUNITY): Payer: Self-pay | Admitting: Family Medicine

## 2023-08-17 DIAGNOSIS — E785 Hyperlipidemia, unspecified: Secondary | ICD-10-CM

## 2023-08-18 ENCOUNTER — Ambulatory Visit: Payer: Medicare HMO | Attending: Cardiology | Admitting: Cardiology

## 2023-08-18 ENCOUNTER — Encounter: Payer: Self-pay | Admitting: Cardiology

## 2023-08-18 VITALS — BP 100/54 | HR 56 | Ht 60.0 in | Wt 192.0 lb

## 2023-08-18 DIAGNOSIS — Z950 Presence of cardiac pacemaker: Secondary | ICD-10-CM | POA: Diagnosis not present

## 2023-08-18 DIAGNOSIS — I4819 Other persistent atrial fibrillation: Secondary | ICD-10-CM

## 2023-08-18 DIAGNOSIS — D6869 Other thrombophilia: Secondary | ICD-10-CM

## 2023-08-18 DIAGNOSIS — I495 Sick sinus syndrome: Secondary | ICD-10-CM

## 2023-08-18 NOTE — Patient Instructions (Addendum)
 Medication Instructions:   Your physician recommends that you continue on your current medications as directed. Please refer to the Current Medication list given to you today.  *If you need a refill on your cardiac medications before your next appointment, please call your pharmacy*   Lab Work:  No labs ordered today   If you have labs (blood work) drawn today and your tests are completely normal, you will receive your results only by: MyChart Message (if you have MyChart) OR A paper copy in the mail If you have any lab test that is abnormal or we need to change your treatment, we will call you to review the results.   Testing/Procedures:  No test ordered today    Follow-Up: At [facility], you and your health needs are our priority.  As part of our continuing mission to provide you with exceptional heart care, we have created designated Provider Care Teams.  These Care Teams include your primary Cardiologist (physician) and Advanced Practice Providers (APPs -  Physician Assistants and Nurse Practitioners) who all work together to provide you with the care you need, when you need it.  4 - 5 month(s)  Provider:   You will see one of the following Advanced Practice Providers on your designated Care Team:   Nicolasa Ducking, NP Eula Listen, PA-C Cadence Fransico Michael, PA-C Charlsie Quest, NP Carlos Levering, NP

## 2023-08-19 ENCOUNTER — Other Ambulatory Visit: Payer: Self-pay

## 2023-08-19 ENCOUNTER — Telehealth: Payer: Self-pay | Admitting: Pharmacist

## 2023-08-19 NOTE — Patient Instructions (Signed)
Visit Information  Thank you for taking time to visit with me today. Please don't hesitate to contact me if I can be of assistance to you before our next scheduled telephone appointment.  Following is a copy of your care plan:   Goals Addressed             This Visit's Progress    COMPLETED: TOC 30-day Program Care Plan   On track    Current Barriers: ( Reviewed 08/19/23) Provider appointments needs HFU appointment with PCP: her current PCP has left practice and she is scheduled for transfer of care with Dr. Clent Ridges on 08/27/23 Home Health services Confirmed ordered through [facility] on 07/21/23- ( HHPT cannot start until the patient sees her PCP which is in March)    Resides alone; supportive daughter/ caregiver active in patient's care, manages all aspects of patient's medical affairs/ phone calls/ appointments; patient essentially independent in self-care at home and self- manages medications; uses walker "all the time" for ambulation; uses continuous home O2 at 4 L/min via [place] at baseline- prior to recent hospital admission  RNCM Clinical Goal(s): (Reviewed 08/19/23) Patient will work with the Care Management team over the next 30 days to address Transition of Care Barriers: Medication Management Provider appointments Equipment/DME take all medications exactly as prescribed and will call provider for medication related questions as evidenced by caregiver reporting/ review of same during TOC 30-day RN CM weekly outreaches attend all scheduled medical appointments:  PCP HFU  as evidenced by review of same with patient's caregiver/ daughter and in EHR during [facility] 30-day RN CM weekly outreaches  through collaboration with Medical illustrator, provider, and care team.   Interventions: (Reviewed 08/19/23) Evaluation of current treatment plan related to  self management and patient's adherence to plan as established by provider  Transitions of Care:  Goal Met. (Reviewed 08/19/23) Doctor Visits  -  discussed the importance of doctor visits Role of home health services with importance of participation/ ongoing engagement: daughter has not yet heard from [facility]: provided phone number for agency, also provided my direct phone number should issues arise with initiation of services post-TOC call today ([facility] contacted the patient and cannot provide care until a F2F has been completed  Reinforced/ provided education around basics of following heart healthy low salt diet  Patient Goals/Self-Care Activities: (Reviewed 08/19/23) Participate in Transition of Care Program/Attend TOC scheduled calls Take all medications as prescribed Attend all scheduled provider appointments Call provider office for new concerns or questions  Continue using home oxygen as prescribed  Follow Up Plan:  Completed 30 Day Outreach [facility] Program         Patient verbalizes understanding of instructions and care plan provided today and agrees to view in MyChart. Active MyChart status and patient understanding of how to access instructions and care plan via MyChart confirmed with patient.     The patient has been provided with contact information for the care management team and has been advised to call with any health related questions or concerns.   Please call the care guide team at [phone] if you need to cancel or reschedule your appointment.   Please call the Suicide and Crisis Lifeline: 988 call the [place] National Suicide Prevention Lifeline: 2174840081 or TTY: 630-472-4090 TTY 787-622-7576) to talk to a trained counselor if you are experiencing a Mental Health or Behavioral Health Crisis or need someone to talk to.  Deidre Ala, BSN, Dance movement psychotherapist Health  VBCI - [employer] Health RN Care Manager  [phone]  

## 2023-08-19 NOTE — Telephone Encounter (Signed)
 Medication list reviewed in anticipation of upcoming Tikosyn initiation. Patient is  taking one contraindicated or QTc prolonging medications.   Concurrent use of DOFETILIDE and TORSEMIDE may result in an increased risk of cardiotoxicity (QT prolongation, torsades de pointes, cardiac arrest). Recommend close monitoring of EKG and electrolytes  Patient is anticoagulated on Eliquis on the appropriate dose. Please ensure that patient has not missed any anticoagulation doses in the 3 weeks prior to Tikosyn initiation.   Patient will need to be counseled to avoid use of Benadryl while on Tikosyn and in the 2-3 days prior to Tikosyn initiation.

## 2023-08-19 NOTE — Telephone Encounter (Signed)
-----   Message from Nurse Milta Deiters sent at 08/18/2023  4:34 PM EST ----- Regarding: FW: tikosyn Pt for tikosyn please reivew meds thanks stacy ----- Message ----- From: Sherie Don, NP Sent: 08/18/2023   3:16 PM EST To: Debra Simpson, RN; Darene Lamer, LPN; # Subject: Debra Saunders,  I saw this patient earlier today and she is interested in [place]. She is on some uncommon medications, so wasn't sure if it is even an option for her with her other, chronic meds.  If pharm clears her, she is interested in tikosyn loading.   Could you start the process please, and cc me on the msg to pharmacy (for my own info)?    Thanks, [employer]

## 2023-08-19 NOTE — Patient Outreach (Signed)
 Care Management  Transitions of Care Program Transitions of Care Post-discharge week 5   08/19/2023 Name: Debra Saunders MRN: [id number] DOB: 1946/06/09  Subjective: Debra Saunders is a 78 y.o. year old female who is a primary care patient of Birdie Sons, Yehuda Mao, MD. The Care Management team Engaged with patient Engaged with patient by telephone to assess and address transitions of care needs.   Consent to Services:  Patient was given information about care management services, agreed to services, and gave verbal consent to participate.   Assessment: TOC Outreach to the patient today. The patient went to her cardiologist yesterday and she is in A-fib most of the time which causes her to be SOB and uncomfortable. The provider discussed admitting her to the hospital for Tikosyn load to help get her A-fib under control which she is willing to do. Her weight is stable at 187lbs. She was recommended to re-start her Rosuvastatin. She has completed the 30 day TOC program and declines further support with CCM.   SDOH Interventions    Flowsheet Row Patient Outreach from 08/06/2023 in [place] POPULATION HEALTH DEPARTMENT Patient Outreach from 07/30/2023 in [place] POPULATION HEALTH DEPARTMENT Telephone from 07/22/2023 in [place] POPULATION HEALTH DEPARTMENT Clinical Support from 03/09/2023 in [facility] Visit from 11/18/2022 in [facility] [place] ED to Hosp-Admission (Discharged) from 10/19/2022 in [facility] PCU  SDOH Interventions        Food Insecurity Interventions Intervention Not Indicated Intervention Not Indicated Intervention Not Indicated Intervention Not Indicated -- --  Housing Interventions Intervention Not Indicated Intervention Not Indicated Intervention Not Indicated Intervention Not Indicated -- --  Transportation Interventions Intervention Not Indicated  Intervention Not Indicated Intervention Not Indicated  [daughter [name] provides all transportation] Intervention Not Indicated [facility] ( [facility] Only) Inpatient TOC, Other (Comment)  [Resources added to AVS.]  Utilities Interventions Intervention Not Indicated Intervention Not Indicated Intervention Not Indicated Intervention Not Indicated -- --  Alcohol Usage Interventions -- Intervention Not Indicated (Score <7) -- Intervention Not Indicated (Score <7) -- --  Financial Strain Interventions -- Intervention Not Indicated -- Intervention Not Indicated -- --  Physical Activity Interventions -- Intervention Not Indicated -- Intervention Not Indicated -- --  Stress Interventions -- Intervention Not Indicated -- Intervention Not Indicated -- --  Social Connections Interventions -- Intervention Not Indicated -- Intervention Not Indicated -- --  Health Literacy Interventions -- Intervention Not Indicated -- -- -- --        Goals Addressed             This Visit's Progress    COMPLETED: TOC 30-day Program Care Plan   On track    Current Barriers: ( Reviewed 08/19/23) Provider appointments needs HFU appointment with PCP: her current PCP has left practice and she is scheduled for transfer of care with Dr. Clent Ridges on 08/27/23 Home Health services Confirmed ordered through [facility] on 07/21/23- ( HHPT cannot start until the patient sees her PCP which is in March)    Resides alone; supportive daughter/ caregiver active in patient's care, manages all aspects of patient's medical affairs/ phone calls/ appointments; patient essentially independent in self-care at home and self- manages medications; uses walker "all the time" for ambulation; uses continuous home O2 at 4 L/min via [place] at baseline- prior to recent hospital admission  RNCM Clinical Goal(s): (Reviewed 08/19/23) Patient will work with the  Care Management team over the next 30 days to address Transition of Care Barriers: Medication  Management Provider appointments Equipment/DME take all medications exactly as prescribed and will call provider for medication related questions as evidenced by caregiver reporting/ review of same during TOC 30-day RN CM weekly outreaches attend all scheduled medical appointments:  PCP HFU  as evidenced by review of same with patient's caregiver/ daughter and in EHR during [facility] 30-day RN CM weekly outreaches  through collaboration with Medical illustrator, provider, and care team.   Interventions: (Reviewed 08/19/23) Evaluation of current treatment plan related to  self management and patient's adherence to plan as established by provider  Transitions of Care:  Goal Met. (Reviewed 08/19/23) Doctor Visits  - discussed the importance of doctor visits Role of home health services with importance of participation/ ongoing engagement: daughter has not yet heard from [facility]: provided phone number for agency, also provided my direct phone number should issues arise with initiation of services post-TOC call today ([facility] contacted the patient and cannot provide care until a F2F has been completed  Reinforced/ provided education around basics of following heart healthy low salt diet  Patient Goals/Self-Care Activities: (Reviewed 08/19/23) Participate in Transition of Care Program/Attend TOC scheduled calls Take all medications as prescribed Attend all scheduled provider appointments Call provider office for new concerns or questions  Continue using home oxygen as prescribed  Follow Up Plan:  Completed 30 Day Outreach [facility] Program         Plan: The patient has been provided with contact information for the care management team and has been advised to call with any health related questions or concerns.   Deidre Ala, BSN, RN [place]  VBCI - [employer] [facility] RN Care Manager [phone]

## 2023-08-21 ENCOUNTER — Telehealth (HOSPITAL_COMMUNITY): Payer: Self-pay | Admitting: Pharmacy Technician

## 2023-08-21 ENCOUNTER — Other Ambulatory Visit (HOSPITAL_COMMUNITY): Payer: Self-pay

## 2023-08-21 NOTE — Telephone Encounter (Signed)
 Patient Product/process development scientist completed.    The patient is insured through [employer]. Patient has Medicare and is not eligible for a copay card, but may be able to apply for patient assistance or Medicare RX Payment Plan (Patient Must reach out to their plan, if eligible for payment plan), if available.    Ran test claim for dofetilide (Tikosyn) 500 mcg and the current 30 day co-pay is $0.00.   This test claim was processed through [facility]- copay amounts may vary at other pharmacies due to pharmacy/plan contracts, or as the patient moves through the different stages of their insurance plan.     Roland Earl, CPHT Pharmacy Technician III Certified Patient Advocate [facility] Patient Advocate Team Direct Number: [phone]  Fax: [phone]

## 2023-08-25 ENCOUNTER — Ambulatory Visit: Payer: Medicare HMO

## 2023-08-26 ENCOUNTER — Inpatient Hospital Stay: Payer: Medicare HMO

## 2023-08-26 ENCOUNTER — Inpatient Hospital Stay: Payer: Medicare HMO | Attending: Internal Medicine

## 2023-08-26 VITALS — BP 106/52 | HR 61 | Temp 97.9°F | Resp 19

## 2023-08-26 DIAGNOSIS — N183 Chronic kidney disease, stage 3 unspecified: Secondary | ICD-10-CM | POA: Diagnosis present

## 2023-08-26 DIAGNOSIS — I4891 Unspecified atrial fibrillation: Secondary | ICD-10-CM | POA: Diagnosis not present

## 2023-08-26 DIAGNOSIS — D631 Anemia in chronic kidney disease: Secondary | ICD-10-CM | POA: Diagnosis present

## 2023-08-26 DIAGNOSIS — Z86718 Personal history of other venous thrombosis and embolism: Secondary | ICD-10-CM | POA: Diagnosis not present

## 2023-08-26 DIAGNOSIS — E538 Deficiency of other specified B group vitamins: Secondary | ICD-10-CM | POA: Diagnosis not present

## 2023-08-26 DIAGNOSIS — Z950 Presence of cardiac pacemaker: Secondary | ICD-10-CM | POA: Diagnosis not present

## 2023-08-26 DIAGNOSIS — E876 Hypokalemia: Secondary | ICD-10-CM | POA: Diagnosis not present

## 2023-08-26 DIAGNOSIS — R197 Diarrhea, unspecified: Secondary | ICD-10-CM | POA: Insufficient documentation

## 2023-08-26 DIAGNOSIS — R5383 Other fatigue: Secondary | ICD-10-CM | POA: Diagnosis not present

## 2023-08-26 DIAGNOSIS — Z87891 Personal history of nicotine dependence: Secondary | ICD-10-CM | POA: Insufficient documentation

## 2023-08-26 DIAGNOSIS — Z7901 Long term (current) use of anticoagulants: Secondary | ICD-10-CM | POA: Diagnosis not present

## 2023-08-26 DIAGNOSIS — K909 Intestinal malabsorption, unspecified: Secondary | ICD-10-CM | POA: Insufficient documentation

## 2023-08-26 DIAGNOSIS — E611 Iron deficiency: Secondary | ICD-10-CM

## 2023-08-26 MED ORDER — CYANOCOBALAMIN 1000 MCG/ML IJ SOLN
1000.0000 ug | Freq: Once | INTRAMUSCULAR | Status: AC
  Administered 2023-08-26: 1000 ug via INTRAMUSCULAR

## 2023-08-26 MED ORDER — SODIUM CHLORIDE 0.9% FLUSH
10.0000 mL | Freq: Once | INTRAVENOUS | Status: AC | PRN
Start: 2023-08-26 — End: 2023-08-26
  Administered 2023-08-26: 10 mL
  Filled 2023-08-26: qty 10

## 2023-08-26 MED ORDER — IRON SUCROSE 20 MG/ML IV SOLN
200.0000 mg | Freq: Once | INTRAVENOUS | Status: AC
  Administered 2023-08-26: 200 mg via INTRAVENOUS

## 2023-08-27 ENCOUNTER — Encounter: Payer: Medicare HMO | Admitting: Family Medicine

## 2023-08-28 ENCOUNTER — Encounter: Payer: Medicare HMO | Admitting: Cardiology

## 2023-08-28 NOTE — Telephone Encounter (Signed)
 Lm to schedule

## 2023-09-02 ENCOUNTER — Ambulatory Visit (INDEPENDENT_AMBULATORY_CARE_PROVIDER_SITE_OTHER): Admitting: Internal Medicine

## 2023-09-02 ENCOUNTER — Other Ambulatory Visit: Payer: Self-pay

## 2023-09-02 ENCOUNTER — Encounter: Payer: Self-pay | Admitting: Internal Medicine

## 2023-09-02 VITALS — BP 114/66 | HR 75 | Temp 98.4°F | Ht 60.0 in | Wt 190.6 lb

## 2023-09-02 DIAGNOSIS — F5101 Primary insomnia: Secondary | ICD-10-CM | POA: Diagnosis not present

## 2023-09-02 DIAGNOSIS — Z9989 Dependence on other enabling machines and devices: Secondary | ICD-10-CM

## 2023-09-02 DIAGNOSIS — G4700 Insomnia, unspecified: Secondary | ICD-10-CM | POA: Insufficient documentation

## 2023-09-02 DIAGNOSIS — I5033 Acute on chronic diastolic (congestive) heart failure: Secondary | ICD-10-CM

## 2023-09-02 DIAGNOSIS — B351 Tinea unguium: Secondary | ICD-10-CM | POA: Diagnosis not present

## 2023-09-02 MED ORDER — MELATONIN 1 MG PO CAPS: 1.0000 mg | ORAL_CAPSULE | Freq: Every day | ORAL | 0 refills | Status: DC

## 2023-09-02 NOTE — Patient Instructions (Signed)
-  It was a pleasure meeting you today -I have prescribed melatonin for your insomnia.  I do believe that adjusting your sleep hygiene significantly with your insomnia as well.  I have attached a handout to read for sleep hygiene habits -I also recommend that she follow-up with her pulmonologist for your CPAP issues.  I think that you need a machine that fits better so that you can get better sleep -I have referred you to podiatry for the fungal infection in your toenails. -Please contact us with any questions or concerns

## 2023-09-02 NOTE — Assessment & Plan Note (Signed)
-  Patient states that over the last few months he was noted gradual yellowing/blackening of the toenails over her big toes as well as yellowing and some pain over her toenails on the third toes bilaterally -On exam, patient is noted of onychomycosis over both big toenails as well as her third toenails bilaterally. -Patient does have a history of peripheral neuropathy secondary to previous diabetes. -I do believe that she would benefit from a referral to podiatry to treat her onychomycosis but also for toenail debridement as patient is having difficulty with this and given her neuropathy may cut herself and not notice -No further workup at this time

## 2023-09-02 NOTE — Assessment & Plan Note (Signed)
-  Patient complains insomnia over the last few months but especially over the last week -I do believe that some of this is related to her poor sleep hygiene.  She has a TV in her room and watches this when she wakes up.  She also has a poor fitting CPAP machine and states that she is adjust this multiple times in the night and this wakes her up as well.  She also states that the CPAP machine causes her mouth to be dry and that she is drinking a lot of water at night and wakes up twice to urinate. -Patient was given instructions on good sleep hygiene to see if this will help improve her insomnia -Melatonin 1 mg nightly was prescribed to help her as well -I think that she should follow-up with her pulmonologist to see if she can get a better fitting CPAP machine as I believe this will help her get better sleep -No further workup at this time

## 2023-09-02 NOTE — Assessment & Plan Note (Signed)
-  Patient states that she uses a rolling walker at home but that this walker is about 61 to 80 years old and does not work as well -We will put in a DME order for a rollator walker today -No further workup at this time

## 2023-09-02 NOTE — Progress Notes (Signed)
 Acute Office Visit  Subjective:     Patient ID: Debra Saunders, female    DOB: October 01, 1945, 78 y.o.   MRN: [id number]  Chief Complaint  Patient presents with   Acute Visit    Insomnia- wants to know if Melatonin is safe to take with her medications? Order for Rollator Toenails is turning yellow and black    HPI Patient is in today for insomnia, need for new rollator walker as well as onychomycosis of bilateral toenails.  Patient states that she has been having difficulty sleeping for the last few months but especially over the last week.  Patient states that her CPAP machine does not fit well and she is keep adjusting it which wakes her up.  She also states that her mouth is dry from the CPAP machine and just to drink a lot of water at night and has to wake up to the restroom as a consequence to this.  Patient also does not have good sleep hygiene which contributes to her insomnia.  She has noted yellowing/blackening of her bilateral large toenails as well as the third toenail on each foot.  Patient also notes that her rollator walker is not functioning as well and that it has been at least 8 or 9 years since she got it.  She would like a new rollator walker as well  Review of Systems  Constitutional: Negative.   HENT: Negative.    Respiratory: Negative.    Cardiovascular: Negative.   Gastrointestinal: Negative.   Musculoskeletal:        Complains of yellowing/blackening of her toenails bilaterally  Skin: Negative.   Neurological:  Negative for dizziness and headaches.  Psychiatric/Behavioral:  The patient has insomnia.         Objective:    BP 114/66   Pulse 75   Temp 98.4 F (36.9 C)   Ht 5' (1.524 m)   Wt 190 lb 9.6 oz (86.5 kg)   SpO2 96%   BMI 37.22 kg/m    Physical Exam Constitutional:      Appearance: Normal appearance.  HENT:     Head: Normocephalic and atraumatic.  Cardiovascular:     Rate and Rhythm: Normal rate and regular rhythm.     Heart sounds:  Normal heart sounds.  Pulmonary:     Effort: No respiratory distress.     Breath sounds: No wheezing or rales.  Abdominal:     General: Bowel sounds are normal. There is no distension.     Palpations: Abdomen is soft.     Tenderness: There is no abdominal tenderness.  Musculoskeletal:     Left lower leg: Edema present.     Comments: Onychomycosis noted over her big toenails as well as third toenails on each foot.  Chronic left lower extremity edema noted.  Unchanged per patient.  Neurological:     Mental Status: She is alert and oriented to person, place, and time.  Psychiatric:        Mood and Affect: Mood normal.        Behavior: Behavior normal.     No results found for any visits on 09/02/23.      Assessment & Plan:   Problem List Items Addressed This Visit       Musculoskeletal and Integument   Onychomycosis - Primary   -Patient states that over the last few months he was noted gradual yellowing/blackening of the toenails over her big toes as well as yellowing and some pain over  her toenails on the third toes bilaterally -On exam, patient is noted of onychomycosis over both big toenails as well as her third toenails bilaterally. -Patient does have a history of peripheral neuropathy secondary to previous diabetes. -I do believe that she would benefit from a referral to podiatry to treat her onychomycosis but also for toenail debridement as patient is having difficulty with this and given her neuropathy may cut herself and not notice -No further workup at this time      Relevant Orders   Ambulatory referral to Podiatry     Other   Insomnia   -Patient complains insomnia over the last few months but especially over the last week -I do believe that some of this is related to her poor sleep hygiene.  She has a TV in her room and watches this when she wakes up.  She also has a poor fitting CPAP machine and states that she is adjust this multiple times in the night and this  wakes her up as well.  She also states that the CPAP machine causes her mouth to be dry and that she is drinking a lot of water at night and wakes up twice to urinate. -Patient was given instructions on good sleep hygiene to see if this will help improve her insomnia -Melatonin 1 mg nightly was prescribed to help her as well -I think that she should follow-up with her pulmonologist to see if she can get a better fitting CPAP machine as I believe this will help her get better sleep -No further workup at this time      Relevant Medications   Melatonin 1 MG CAPS   Uses roller walker   -Patient states that she uses a rolling walker at home but that this walker is about 66 to 80 years old and does not work as well -We will put in a DME order for a rollator walker today -No further workup at this time       Meds ordered this encounter  Medications   Melatonin 1 MG CAPS    Sig: Take 1 capsule (1 mg total) by mouth at bedtime.    Dispense:  30 capsule    Refill:  0    No follow-ups on file.  Earl Lagos, MD

## 2023-09-09 ENCOUNTER — Telehealth: Payer: Self-pay | Admitting: Cardiology

## 2023-09-09 NOTE — Telephone Encounter (Signed)
 Pt confirmed appt for 09/10/23.

## 2023-09-10 ENCOUNTER — Ambulatory Visit: Payer: Medicare HMO | Attending: Cardiology | Admitting: Cardiology

## 2023-09-10 VITALS — BP 101/53 | HR 65 | Wt 192.2 lb

## 2023-09-10 DIAGNOSIS — I5033 Acute on chronic diastolic (congestive) heart failure: Secondary | ICD-10-CM

## 2023-09-10 MED ORDER — MIDODRINE HCL 5 MG PO TABS: 5.0000 mg | ORAL_TABLET | Freq: Three times a day (TID) | ORAL | 5 refills | Status: DC

## 2023-09-10 NOTE — Progress Notes (Signed)
   ADVANCED HEART FAILURE FOLLOW UP CLINIC NOTE  Referring Physician: Glori Luis, MD  Primary Care: Glori Luis, MD (Inactive) Primary Cardiologist:  HPI: Debra Saunders is a 78 y.o. female who presents for follow up of pulmonary hypertension.      Admitted 10/24-10/29/19 with acute on chronic diastolic HF. HF team consulted with concerns for low output. PICC was placed and milrinone to support RV function was started along with IV Lasix to facilitate diuresis. RHC done to evaluate PAH (see below). V/Q scan negative. High res CT showed pulmonary fibrosis. Pulmonary consult and recommended stopping amiodarone. She was transitioned to torsemide 60 mg BID      SUBJECTIVE:  Patient reports ongoing lightheadedness and dizziness that appears to be worse since he went down on her midodrine dose.  She has had no issues with her medication and has been compliant with her pulmonary hypertension medicines.  She reports that her left lower extremity swelling is largely unchanged.  She denies any other chest pain, syncope, or worsening shortness of breath.  PMH, current medications, allergies, social history, and family history reviewed in epic.  PHYSICAL EXAM: Vitals:   09/10/23 1452  BP: (!) 101/53  Pulse: 65  SpO2: 95%   GENERAL: Chronically ill-appearing PULM: Mildly increased work of breathing, diffuse rhonchi CARDIAC:  JVP: Mildly elevated         Normal rate with regular rhythm. No murmurs, rubs or gallops.  Trace edema. Warm and well perfused extremities. ABDOMEN: Soft, non-tender, non-distended. NEUROLOGIC: Patient is oriented x3 with no focal or lateralizing neurologic deficits.    DATA REVIEW  ECHO: - Echo (3/21): EF 60-65%, normal RV, PASP 50 mmHg, moderate-severe MR. - TEE (1/23): EF 60-65%, mild RV dilation with normal RV systolic function, MR mild-moderate, moderate TR with vegetation on TV and pacemaker lead - Echo (1/23): EF 60-65%, normal RV, RVSP 51.5  mmHg, mild-moderate MR, moderate TR - Echo (3/23): EF 60-65%, mild LVH, normal RV, IVC normal, no definite vegetation noted.    CATH: - RHC (8/23): RA 3, PA 29/10 (mean 16), PCWP 12, CO/CI (Fick) 6.18/3.37 - RHC (1/25): mean RA 5, PA 54/20 mean 29, mean PCWP 13, CI 2.36, PVR 3.7 WU.     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  Group G strep bacteremia.  Septic emboli to lungs. Seen by EP, thought to be too high risk for device extraction. Unfortunately she will be at high risk for complications going forward.  - She is now on lifetime suppression with cefadroxil.   Orthostatic hypotension:  - Symptoms worsened on 2.5mg  TID dosing, increase back to 5mg  TID  Follow up with Dr. Helen Hashimoto, MD Advanced Heart Failure Mechanical Circulatory Support 09/22/23

## 2023-09-10 NOTE — Patient Instructions (Signed)
 Medication Changes:  INCREASE YOUR MIDODRINE TO 5 MG THREE TIMES DAILY   Follow-Up in: 2 MONTHS WITH DR. Shirlee Latch.  At the [facility] Failure [facility], you and your health needs are our priority. We have a designated team specialized in the treatment of Heart Failure. This Care Team includes your primary Heart Failure Specialized Cardiologist (physician), Advanced Practice Providers (APPs- Physician Assistants and Nurse Practitioners), and Pharmacist who all work together to provide you with the care you need, when you need it.   You may see any of the following providers on your designated Care Team at your next follow up:  Dr. Arvilla Meres Dr. Marca Ancona Dr. Dorthula Nettles Dr. Theresia Bough Tonye Becket, NP Robbie Lis, [place] [facility] [place], [place] Brynda Peon, NP [place] Lee, NP Karle Plumber, PharmD   Please be sure to bring in all your medications bottles to every appointment.   Need to Contact us:  If you have any questions or concerns before your next appointment please send [place] a message through [place] or call our office at [phone].    TO LEAVE A MESSAGE FOR THE NURSE SELECT OPTION 2, PLEASE LEAVE A MESSAGE INCLUDING: YOUR NAME DATE OF BIRTH CALL BACK NUMBER REASON FOR CALL**this is important as we prioritize the call backs  YOU WILL RECEIVE A CALL BACK THE SAME DAY AS LONG AS YOU CALL BEFORE 4:00 PM

## 2023-09-18 ENCOUNTER — Other Ambulatory Visit (HOSPITAL_COMMUNITY): Payer: Self-pay | Admitting: Cardiology

## 2023-09-23 ENCOUNTER — Ambulatory Visit

## 2023-09-23 VITALS — BP 122/82 | HR 82 | Temp 98.7°F | Ht 60.0 in | Wt 193.0 lb

## 2023-09-23 DIAGNOSIS — N1831 Chronic kidney disease, stage 3a: Secondary | ICD-10-CM | POA: Diagnosis not present

## 2023-09-23 DIAGNOSIS — F5101 Primary insomnia: Secondary | ICD-10-CM | POA: Diagnosis not present

## 2023-09-23 DIAGNOSIS — E1122 Type 2 diabetes mellitus with diabetic chronic kidney disease: Secondary | ICD-10-CM | POA: Insufficient documentation

## 2023-09-23 DIAGNOSIS — E611 Iron deficiency: Secondary | ICD-10-CM

## 2023-09-23 DIAGNOSIS — I272 Pulmonary hypertension, unspecified: Secondary | ICD-10-CM

## 2023-09-23 DIAGNOSIS — E782 Mixed hyperlipidemia: Secondary | ICD-10-CM | POA: Diagnosis not present

## 2023-09-23 DIAGNOSIS — G8929 Other chronic pain: Secondary | ICD-10-CM

## 2023-09-23 DIAGNOSIS — N1832 Chronic kidney disease, stage 3b: Secondary | ICD-10-CM

## 2023-09-23 DIAGNOSIS — D631 Anemia in chronic kidney disease: Secondary | ICD-10-CM

## 2023-09-23 DIAGNOSIS — I10 Essential (primary) hypertension: Secondary | ICD-10-CM

## 2023-09-23 DIAGNOSIS — M5489 Other dorsalgia: Secondary | ICD-10-CM

## 2023-09-23 DIAGNOSIS — M545 Low back pain, unspecified: Secondary | ICD-10-CM

## 2023-09-23 DIAGNOSIS — Z95811 Presence of heart assist device: Secondary | ICD-10-CM | POA: Insufficient documentation

## 2023-09-23 MED ORDER — MELATONIN 1 MG PO CAPS: 1.0000 mg | ORAL_CAPSULE | Freq: Every day | ORAL | 0 refills | Status: DC

## 2023-09-23 MED ORDER — DICLOFENAC SODIUM 1 % EX GEL
2.0000 g | Freq: Four times a day (QID) | CUTANEOUS | Status: AC
Start: 2023-09-23 — End: ?

## 2023-09-23 NOTE — Assessment & Plan Note (Signed)
 Check BMP. Patient plans on following up with nephrologist. We will check albumin:cr ratio before her next visit. Lab ordered.

## 2023-09-23 NOTE — Assessment & Plan Note (Signed)
 Management per pulmonology.

## 2023-09-23 NOTE — Assessment & Plan Note (Signed)
 BP 122/82 mmHg, management per cardiology, on multiple cardiac medications.

## 2023-09-23 NOTE — Assessment & Plan Note (Signed)
 Discussed sleep hygiene. Take melatonin 1 mg 2 hours before bedtime prn.

## 2023-09-23 NOTE — Assessment & Plan Note (Signed)
 Continue rosuvastatin 10 mg daily. Recheck lipid panel next visit. Lab ordered.

## 2023-09-23 NOTE — Assessment & Plan Note (Signed)
 Likely musculoskeletal, chronic in nature. Prn Tylenol max dose less than 2gm/day. PRN diclofenac Sodium 1 % topical gel 2-4 times a day as needed.

## 2023-09-23 NOTE — Assessment & Plan Note (Signed)
 Recommend updating CBC, BMP, vitamin D level. Follow up with heme/onc for iron infusion. If hb <7 mg/dl will recommend patient be seen in ED.

## 2023-09-23 NOTE — Assessment & Plan Note (Signed)
 Lifestyle modification including healthy diet, optimizing sleep, exercise discussed.

## 2023-09-23 NOTE — Progress Notes (Signed)
 Established Patient Office Visit   Subjective  Patient ID: Debra Saunders, female    DOB: 07-20-1945  Age: 78 y.o. MRN: [id number]  Chief Complaint  Patient presents with   Transitions Of Care  HPI:   Established patient of Dr. Birdie Sons presenting for transition of care. Patient has complex medical history. Patient is here with her daughter, history obtained from both patient and her daughter. Her acute concerns includes the following:   - Insomnia, feeling tired:  Patient was seen by Dr. Earl Lagos on 09/02/23 for  insomnia and was recommended to start Melatonin 1 mg daily. Patient has not picked up the prescription yet. She is a night owl and does not sleep 4-5 AM. Once asleep she is getting anywhere from 7-8 hours sleep at night. She watches TV, plays games when she is in bed. She would like to go to bed earlier and wake up early as well.   - Left lower back pain: Chronic. Has applied lidocaine gel, icy hot, Tylenol, heating pad for pain. Denies h/o fall, urinary symptoms.    Following HPI was reviewed as well during today's visit:  1) Vitamin B12 deficiency: IM every 3 to 4 months   2) Iron deficiency anemia: On infusion, sees heme/onc and is on venofer therapy. Last Hb was 10.2 on 07/28/23.   3) Pulmonary hypertension 4) Chronic HFpEF - Established with cardiology, CHF clinic.  - Saw Dr. Clearnce Hasten on 09/10/23. For pulmonary hypertension on Macitentan 10mg , selexipag 1600mg  BID, sildenafil 20mg  TID, home oxygen and CPAP.  - On Torsemide 60 mg for CHF.   5) Atrial fibrillation (Electrophysiologist: Sherryl Manges, MD): Following up with EP and is in the process of Tikosyn administration which patient reports is scheduled. On Eliquis BID. Denies chest pain, palpitations.   6) Orthostatic hypotension: On Midodrine 5 mg TID, reports she is doing better.   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   8) CKD:  Patient was previously seeing nephrologist. She is planning on reaching out to her nephrologist to schedule an appointment.   9) H/O diabetes: Medical record review shows patient's HbA1c from  07/18/2023  to be 5.1 %.    She  has a past medical history of Acute hypokalemia (07/18/2023), Arthritis, Atrial fibrillation, persistent (HCC), CAD (coronary artery disease), Cardiorenal syndrome, Chronic back pain, Chronic combined systolic (congestive) and diastolic (congestive) heart failure (HCC), Chronic respiratory failure (HCC), Gastritis, History of COVID-19, History of endocarditis (08/22/2021), History of intracranial hemorrhage, Hyperlipidemia, Hypertension, Infection due to implanted cardiac device (HCC) (07/25/2021), Interstitial lung disease (HCC), Lipoma of back, Lymphedema, NICM (nonischemic cardiomyopathy) (HCC), Obesity, PAH (pulmonary artery hypertension) (HCC), 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   ROS As per HPI    Objective:     BP 122/82   Pulse 82   Temp 98.7 F (37.1 C) (Oral)   Ht 5' (1.524 m)   Wt 193 lb (87.5 kg)   SpO2 97%   BMI 37.69 kg/m    Physical Exam Constitutional:      Appearance: She is obese.     Comments: On 4L/min supplemental oxygen via nasal cannula  HENT:     Head: Normocephalic and atraumatic.  Eyes:     Pupils: Pupils are equal, round, and  reactive to light.  Cardiovascular:     Rate and Rhythm: Normal rate. Rhythm irregular.  Pulmonary:     Effort: Pulmonary effort is normal.     Breath sounds: No wheezing.  Abdominal:     General: Abdomen is protuberant.     Palpations: Abdomen is soft.     Tenderness: There is no right CVA tenderness, left  CVA tenderness or guarding.  Musculoskeletal:     Comments: Left lower leg non-pitting edema   Skin:    General: Skin is warm.  Neurological:     Mental Status: She is alert and oriented to person, place, and time.  Psychiatric:        Mood and Affect: Mood normal.       Assessment & Plan:  Primary insomnia Assessment & Plan: Discussed sleep hygiene. Take melatonin 1 mg 2 hours before bedtime prn.   Orders: -     Melatonin; Take 1 capsule (1 mg total) by mouth at bedtime.  Dispense: 30 capsule; Refill: 0  Stage 3a chronic kidney disease (HCC) -     Basic metabolic panel with GFR -     VITAMIN D 25 Hydroxy (Vit-D Deficiency, Fractures) -     Microalbumin / creatinine urine ratio; Future  Iron deficiency Assessment & Plan: Recommend updating CBC, BMP, vitamin D level. Follow up with heme/onc for iron infusion. If hb <7 mg/dl will recommend patient be seen in ED.   Orders: -     CBC with Differential/Platelet  Mixed hyperlipidemia Assessment & Plan: Continue rosuvastatin 10 mg daily. Recheck lipid panel next visit. Lab ordered.   Orders: -     Lipid panel; Future  Other acute back pain  Presence of heart assist device (HCC)  Obesity, morbid (HCC) Assessment & Plan: Lifestyle modification including healthy diet, optimizing sleep, exercise discussed.    Type 2 diabetes mellitus with chronic kidney disease, without long-term current use of insulin, unspecified CKD stage (HCC) Assessment & Plan: Check BMP. Patient plans on following up with nephrologist. We will check albumin:cr ratio before her next visit. Lab ordered.    Anemia due to stage 3b chronic kidney disease (HCC) Assessment & Plan: Recommend updating CBC, BMP, vitamin D level. Follow up with heme/onc for iron infusion. If hb <7 mg/dl will recommend patient be seen in ED.    Primary hypertension Assessment & Plan: BP 122/82 mmHg, management per cardiology, on multiple cardiac medications.     Pulmonary HTN (HCC) Assessment & Plan: Management per pulmonology.    Chronic left-sided low back pain without sciatica Assessment & Plan: Likely musculoskeletal, chronic in nature. Prn Tylenol max dose less than 2gm/day. PRN diclofenac Sodium 1 % topical gel 2-4 times a day as needed.   Orders: -     Diclofenac Sodium   Return in about 3 months (around 12/23/2023) for Chronic follow up.   Skip Estimable, MD

## 2023-09-24 ENCOUNTER — Other Ambulatory Visit: Payer: Self-pay

## 2023-09-24 ENCOUNTER — Ambulatory Visit: Admitting: Podiatry

## 2023-09-24 DIAGNOSIS — E559 Vitamin D deficiency, unspecified: Secondary | ICD-10-CM | POA: Insufficient documentation

## 2023-09-24 LAB — CBC WITH DIFFERENTIAL/PLATELET
Basophils Absolute: 0 10*3/uL (ref 0.0–0.1)
Basophils Relative: 0.7 % (ref 0.0–3.0)
Eosinophils Absolute: 0.1 10*3/uL (ref 0.0–0.7)
Eosinophils Relative: 3.1 % (ref 0.0–5.0)
HCT: 32.5 % — ABNORMAL LOW (ref 36.0–46.0)
Hemoglobin: 10.6 g/dL — ABNORMAL LOW (ref 12.0–15.0)
Lymphocytes Relative: 23.4 % (ref 12.0–46.0)
Lymphs Abs: 1 10*3/uL (ref 0.7–4.0)
MCHC: 32.5 g/dL (ref 30.0–36.0)
MCV: 84.8 fl (ref 78.0–100.0)
Monocytes Absolute: 0.3 10*3/uL (ref 0.1–1.0)
Monocytes Relative: 7.2 % (ref 3.0–12.0)
Neutro Abs: 2.9 10*3/uL (ref 1.4–7.7)
Neutrophils Relative %: 65.6 % (ref 43.0–77.0)
Platelets: 166 10*3/uL (ref 150.0–400.0)
RBC: 3.84 Mil/uL — ABNORMAL LOW (ref 3.87–5.11)
RDW: 15.9 % — ABNORMAL HIGH (ref 11.5–15.5)
WBC: 4.4 10*3/uL (ref 4.0–10.5)

## 2023-09-24 LAB — BASIC METABOLIC PANEL WITH GFR
BUN: 22 mg/dL (ref 6–23)
CO2: 29 meq/L (ref 19–32)
Calcium: 9.1 mg/dL (ref 8.4–10.5)
Chloride: 103 meq/L (ref 96–112)
Creatinine, Ser: 1.19 mg/dL (ref 0.40–1.20)
GFR: 43.27 mL/min — ABNORMAL LOW (ref >60.00)
Glucose, Bld: 88 mg/dL (ref 70–99)
Potassium: 4.1 meq/L (ref 3.5–5.1)
Sodium: 141 meq/L (ref 135–145)

## 2023-09-24 LAB — VITAMIN D 25 HYDROXY (VIT D DEFICIENCY, FRACTURES): VITD: 8.1 ng/mL — ABNORMAL LOW (ref 30.00–100.00)

## 2023-09-24 MED ORDER — VITAMIN D (ERGOCALCIFEROL) 1.25 MG (50000 UNIT) PO CAPS: 50000.0000 [IU] | ORAL_CAPSULE | ORAL | 0 refills | Status: DC

## 2023-09-24 NOTE — Assessment & Plan Note (Signed)
-   Vitamin D (25 hydroxy); Future - Vitamin D, Ergocalciferol, (DRISDOL) 1.25 MG (50000 UNIT) CAPS capsule; Take 1 capsule (50,000 Units total) by mouth every 7 (seven) days.  Dispense: 9 capsule; Refill: 0

## 2023-09-25 ENCOUNTER — Encounter (HOSPITAL_COMMUNITY): Payer: Self-pay

## 2023-09-29 ENCOUNTER — Encounter (HOSPITAL_COMMUNITY): Payer: Self-pay | Admitting: Cardiovascular Disease

## 2023-09-29 ENCOUNTER — Ambulatory Visit: Payer: Medicare HMO | Admitting: Internal Medicine

## 2023-09-29 ENCOUNTER — Encounter (HOSPITAL_COMMUNITY): Payer: Self-pay | Admitting: Physician Assistant

## 2023-09-29 ENCOUNTER — Telehealth (HOSPITAL_COMMUNITY): Payer: Self-pay | Admitting: Pharmacy Technician

## 2023-09-29 ENCOUNTER — Inpatient Hospital Stay (HOSPITAL_COMMUNITY)
Admission: AD | Admit: 2023-09-29 | Discharge: 2023-10-02 | DRG: 274 | Disposition: A | Discharge status: Home / Self Care | Source: Ambulatory Visit | Attending: Cardiovascular Disease | Admitting: Cardiovascular Disease

## 2023-09-29 ENCOUNTER — Other Ambulatory Visit: Payer: Medicare HMO

## 2023-09-29 ENCOUNTER — Ambulatory Visit: Payer: Medicare HMO

## 2023-09-29 ENCOUNTER — Other Ambulatory Visit: Payer: Self-pay

## 2023-09-29 ENCOUNTER — Ambulatory Visit (HOSPITAL_COMMUNITY)
Admission: RE | Admit: 2023-09-29 | Discharge: 2023-09-29 | Disposition: A | Discharge status: Home / Self Care | Source: Ambulatory Visit | Attending: Physician Assistant | Admitting: Physician Assistant

## 2023-09-29 ENCOUNTER — Other Ambulatory Visit (HOSPITAL_COMMUNITY): Payer: Self-pay

## 2023-09-29 VITALS — BP 120/64 | HR 57 | Ht 60.0 in | Wt 193.0 lb

## 2023-09-29 DIAGNOSIS — N189 Chronic kidney disease, unspecified: Secondary | ICD-10-CM | POA: Insufficient documentation

## 2023-09-29 DIAGNOSIS — I495 Sick sinus syndrome: Secondary | ICD-10-CM | POA: Insufficient documentation

## 2023-09-29 DIAGNOSIS — I130 Hypertensive heart and chronic kidney disease with heart failure and stage 1 through stage 4 chronic kidney disease, or unspecified chronic kidney disease: Secondary | ICD-10-CM | POA: Diagnosis present

## 2023-09-29 DIAGNOSIS — Z792 Long term (current) use of antibiotics: Secondary | ICD-10-CM | POA: Insufficient documentation

## 2023-09-29 DIAGNOSIS — J9611 Chronic respiratory failure with hypoxia: Secondary | ICD-10-CM | POA: Diagnosis present

## 2023-09-29 DIAGNOSIS — Z8679 Personal history of other diseases of the circulatory system: Secondary | ICD-10-CM

## 2023-09-29 DIAGNOSIS — Z79899 Other long term (current) drug therapy: Secondary | ICD-10-CM | POA: Insufficient documentation

## 2023-09-29 DIAGNOSIS — G4733 Obstructive sleep apnea (adult) (pediatric): Secondary | ICD-10-CM | POA: Insufficient documentation

## 2023-09-29 DIAGNOSIS — D6869 Other thrombophilia: Secondary | ICD-10-CM | POA: Diagnosis present

## 2023-09-29 DIAGNOSIS — I2721 Secondary pulmonary arterial hypertension: Secondary | ICD-10-CM | POA: Diagnosis present

## 2023-09-29 DIAGNOSIS — D688 Other specified coagulation defects: Secondary | ICD-10-CM | POA: Diagnosis not present

## 2023-09-29 DIAGNOSIS — J449 Chronic obstructive pulmonary disease, unspecified: Secondary | ICD-10-CM | POA: Diagnosis present

## 2023-09-29 DIAGNOSIS — Z8673 Personal history of transient ischemic attack (TIA), and cerebral infarction without residual deficits: Secondary | ICD-10-CM

## 2023-09-29 DIAGNOSIS — I38 Endocarditis, valve unspecified: Secondary | ICD-10-CM | POA: Insufficient documentation

## 2023-09-29 DIAGNOSIS — I428 Other cardiomyopathies: Secondary | ICD-10-CM | POA: Diagnosis present

## 2023-09-29 DIAGNOSIS — I2510 Atherosclerotic heart disease of native coronary artery without angina pectoris: Secondary | ICD-10-CM | POA: Insufficient documentation

## 2023-09-29 DIAGNOSIS — Z8616 Personal history of COVID-19: Secondary | ICD-10-CM

## 2023-09-29 DIAGNOSIS — I509 Heart failure, unspecified: Secondary | ICD-10-CM | POA: Diagnosis not present

## 2023-09-29 DIAGNOSIS — Z950 Presence of cardiac pacemaker: Secondary | ICD-10-CM

## 2023-09-29 DIAGNOSIS — N1831 Chronic kidney disease, stage 3a: Secondary | ICD-10-CM | POA: Diagnosis present

## 2023-09-29 DIAGNOSIS — J8410 Pulmonary fibrosis, unspecified: Secondary | ICD-10-CM | POA: Insufficient documentation

## 2023-09-29 DIAGNOSIS — Z7901 Long term (current) use of anticoagulants: Secondary | ICD-10-CM | POA: Insufficient documentation

## 2023-09-29 DIAGNOSIS — Z86711 Personal history of pulmonary embolism: Secondary | ICD-10-CM

## 2023-09-29 DIAGNOSIS — I4819 Other persistent atrial fibrillation: Secondary | ICD-10-CM | POA: Insufficient documentation

## 2023-09-29 DIAGNOSIS — E1122 Type 2 diabetes mellitus with diabetic chronic kidney disease: Secondary | ICD-10-CM | POA: Insufficient documentation

## 2023-09-29 DIAGNOSIS — I358 Other nonrheumatic aortic valve disorders: Secondary | ICD-10-CM | POA: Diagnosis present

## 2023-09-29 DIAGNOSIS — Z7984 Long term (current) use of oral hypoglycemic drugs: Secondary | ICD-10-CM | POA: Insufficient documentation

## 2023-09-29 DIAGNOSIS — G8929 Other chronic pain: Secondary | ICD-10-CM | POA: Diagnosis present

## 2023-09-29 DIAGNOSIS — I4891 Unspecified atrial fibrillation: Secondary | ICD-10-CM | POA: Diagnosis not present

## 2023-09-29 DIAGNOSIS — M1990 Unspecified osteoarthritis, unspecified site: Secondary | ICD-10-CM | POA: Diagnosis present

## 2023-09-29 DIAGNOSIS — Z6837 Body mass index (BMI) 37.0-37.9, adult: Secondary | ICD-10-CM | POA: Diagnosis not present

## 2023-09-29 DIAGNOSIS — E785 Hyperlipidemia, unspecified: Secondary | ICD-10-CM | POA: Diagnosis present

## 2023-09-29 DIAGNOSIS — I951 Orthostatic hypotension: Secondary | ICD-10-CM | POA: Diagnosis present

## 2023-09-29 DIAGNOSIS — M549 Dorsalgia, unspecified: Secondary | ICD-10-CM | POA: Diagnosis present

## 2023-09-29 DIAGNOSIS — K580 Irritable bowel syndrome with diarrhea: Secondary | ICD-10-CM | POA: Diagnosis present

## 2023-09-29 DIAGNOSIS — I5042 Chronic combined systolic (congestive) and diastolic (congestive) heart failure: Secondary | ICD-10-CM | POA: Diagnosis present

## 2023-09-29 DIAGNOSIS — I5032 Chronic diastolic (congestive) heart failure: Secondary | ICD-10-CM | POA: Insufficient documentation

## 2023-09-29 DIAGNOSIS — Z8619 Personal history of other infectious and parasitic diseases: Secondary | ICD-10-CM

## 2023-09-29 DIAGNOSIS — Z86718 Personal history of other venous thrombosis and embolism: Secondary | ICD-10-CM | POA: Insufficient documentation

## 2023-09-29 DIAGNOSIS — I110 Hypertensive heart disease with heart failure: Secondary | ICD-10-CM | POA: Diagnosis not present

## 2023-09-29 DIAGNOSIS — I071 Rheumatic tricuspid insufficiency: Secondary | ICD-10-CM | POA: Diagnosis present

## 2023-09-29 DIAGNOSIS — R7871 Abnormal lead level in blood: Secondary | ICD-10-CM | POA: Diagnosis present

## 2023-09-29 DIAGNOSIS — E66812 Obesity, class 2: Secondary | ICD-10-CM | POA: Diagnosis present

## 2023-09-29 LAB — BASIC METABOLIC PANEL WITH GFR
Anion gap: 8 (ref 5–15)
BUN: 22 mg/dL (ref 8–23)
CO2: 26 mmol/L (ref 22–32)
Calcium: 9 mg/dL (ref 8.9–10.3)
Chloride: 105 mmol/L (ref 98–111)
Creatinine, Ser: 1.24 mg/dL — ABNORMAL HIGH (ref 0.44–1.00)
GFR, Estimated: 44 mL/min — ABNORMAL LOW (ref >60)
Glucose, Bld: 96 mg/dL (ref 70–99)
Potassium: 4.2 mmol/L (ref 3.5–5.1)
Sodium: 139 mmol/L (ref 135–145)

## 2023-09-29 LAB — HEPATIC FUNCTION PANEL
ALT: 9 U/L (ref 0–44)
AST: 13 U/L — ABNORMAL LOW (ref 15–41)
Albumin: 3.8 g/dL (ref 3.5–5.0)
Alkaline Phosphatase: 83 U/L (ref 38–126)
Bilirubin, Direct: 0.1 mg/dL (ref 0.0–0.2)
Indirect Bilirubin: 0.5 mg/dL (ref 0.3–0.9)
Total Bilirubin: 0.6 mg/dL (ref 0.0–1.2)
Total Protein: 6.8 g/dL (ref 6.5–8.1)

## 2023-09-29 LAB — MAGNESIUM: Magnesium: 2.1 mg/dL (ref 1.7–2.4)

## 2023-09-29 MED ORDER — ROSUVASTATIN CALCIUM 5 MG PO TABS
10.0000 mg | ORAL_TABLET | Freq: Every day | ORAL | Status: DC
  Administered 2023-09-30 – 2023-10-02 (×3): 10 mg via ORAL
  Filled 2023-09-29 (×3): qty 2

## 2023-09-29 MED ORDER — DOFETILIDE 250 MCG PO CAPS
250.0000 ug | ORAL_CAPSULE | Freq: Two times a day (BID) | ORAL | Status: DC
  Administered 2023-09-29 – 2023-10-02 (×6): 250 ug via ORAL
  Filled 2023-09-29 (×6): qty 1

## 2023-09-29 MED ORDER — TORSEMIDE 20 MG PO TABS
60.0000 mg | ORAL_TABLET | Freq: Every day | ORAL | Status: DC
  Administered 2023-09-30 – 2023-10-02 (×3): 60 mg via ORAL
  Filled 2023-09-29 (×3): qty 3

## 2023-09-29 MED ORDER — SODIUM CHLORIDE 0.9% FLUSH: 3.0000 mL | INTRAVENOUS | Status: DC | PRN

## 2023-09-29 MED ORDER — SPIRONOLACTONE 25 MG PO TABS
25.0000 mg | ORAL_TABLET | Freq: Every day | ORAL | Status: DC
Start: 2023-09-30 — End: 2023-10-02
  Administered 2023-09-30 – 2023-10-02 (×3): 25 mg via ORAL
  Filled 2023-09-29 (×3): qty 1

## 2023-09-29 MED ORDER — APIXABAN 5 MG PO TABS
5.0000 mg | ORAL_TABLET | Freq: Two times a day (BID) | ORAL | Status: DC
  Administered 2023-09-29 – 2023-10-02 (×6): 5 mg via ORAL
  Filled 2023-09-29 (×6): qty 1

## 2023-09-29 MED ORDER — PREDNISOLONE ACETATE 1 % OP SUSP
1.0000 [drp] | Freq: Two times a day (BID) | OPHTHALMIC | Status: DC
  Administered 2023-09-29 – 2023-10-02 (×6): 1 [drp] via OPHTHALMIC
  Filled 2023-09-29: qty 5

## 2023-09-29 MED ORDER — MACITENTAN 10 MG PO TABS
10.0000 mg | ORAL_TABLET | Freq: Every day | ORAL | Status: DC
  Administered 2023-10-01 – 2023-10-02 (×2): 10 mg via ORAL
  Filled 2023-09-29: qty 1

## 2023-09-29 MED ORDER — SELEXIPAG 1600 MCG PO TABS
1600.0000 ug | ORAL_TABLET | Freq: Two times a day (BID) | ORAL | Status: DC
  Administered 2023-09-30 – 2023-10-01 (×3): 1600 ug via ORAL
  Filled 2023-09-29 (×3): qty 1

## 2023-09-29 MED ORDER — SODIUM CHLORIDE 0.9 % IV SOLN: 250.0000 mL | INTRAVENOUS | Status: AC | PRN

## 2023-09-29 MED ORDER — ACETAMINOPHEN 325 MG PO TABS
325.0000 mg | ORAL_TABLET | Freq: Four times a day (QID) | ORAL | Status: DC | PRN
  Administered 2023-09-29 – 2023-10-01 (×3): 650 mg via ORAL
  Filled 2023-09-29 (×3): qty 2

## 2023-09-29 MED ORDER — OXYCODONE HCL 5 MG PO TABS: 5.0000 mg | ORAL_TABLET | Freq: Four times a day (QID) | ORAL | Status: DC | PRN

## 2023-09-29 MED ORDER — ATROPINE SULFATE 1 % OP SOLN
1.0000 [drp] | Freq: Every day | OPHTHALMIC | Status: DC
Start: 2023-09-29 — End: 2023-10-02
  Administered 2023-09-29 – 2023-10-01 (×3): 1 [drp] via OPHTHALMIC
  Filled 2023-09-29: qty 2

## 2023-09-29 MED ORDER — MIDODRINE HCL 5 MG PO TABS
5.0000 mg | ORAL_TABLET | Freq: Three times a day (TID) | ORAL | Status: DC
  Administered 2023-09-29 – 2023-10-02 (×9): 5 mg via ORAL
  Filled 2023-09-29 (×9): qty 1

## 2023-09-29 MED ORDER — ALBUTEROL SULFATE 0.63 MG/3ML IN NEBU: 3.0000 mL | INHALATION_SOLUTION | Freq: Four times a day (QID) | RESPIRATORY_TRACT | Status: DC | PRN

## 2023-09-29 MED ORDER — POTASSIUM CHLORIDE CRYS ER 20 MEQ PO TBCR
20.0000 meq | EXTENDED_RELEASE_TABLET | Freq: Every day | ORAL | Status: DC
  Administered 2023-09-29 – 2023-10-01 (×3): 20 meq via ORAL
  Filled 2023-09-29 (×3): qty 1

## 2023-09-29 MED ORDER — POTASSIUM CHLORIDE CRYS ER 20 MEQ PO TBCR
40.0000 meq | EXTENDED_RELEASE_TABLET | Freq: Every day | ORAL | Status: DC
  Administered 2023-09-30 – 2023-10-02 (×3): 40 meq via ORAL
  Filled 2023-09-29 (×3): qty 2

## 2023-09-29 MED ORDER — GABAPENTIN 600 MG PO TABS
600.0000 mg | ORAL_TABLET | Freq: Every day | ORAL | Status: DC
  Filled 2023-09-29: qty 1

## 2023-09-29 MED ORDER — CEFADROXIL 500 MG PO CAPS
500.0000 mg | ORAL_CAPSULE | Freq: Two times a day (BID) | ORAL | Status: DC
  Administered 2023-09-29 – 2023-10-02 (×6): 500 mg via ORAL
  Filled 2023-09-29 (×7): qty 1

## 2023-09-29 MED ORDER — MACITENTAN 10 MG PO TABS: 10.0000 mg | ORAL_TABLET | Freq: Every day | ORAL | Status: DC

## 2023-09-29 MED ORDER — SILDENAFIL CITRATE 20 MG PO TABS
20.0000 mg | ORAL_TABLET | Freq: Three times a day (TID) | ORAL | Status: DC
  Administered 2023-09-29 – 2023-10-02 (×9): 20 mg via ORAL
  Filled 2023-09-29 (×11): qty 1

## 2023-09-29 MED ORDER — SODIUM CHLORIDE 0.9% FLUSH
3.0000 mL | Freq: Two times a day (BID) | INTRAVENOUS | Status: DC
  Administered 2023-09-29 – 2023-10-02 (×6): 3 mL via INTRAVENOUS

## 2023-09-29 MED ORDER — GABAPENTIN 300 MG PO CAPS
600.0000 mg | ORAL_CAPSULE | Freq: Every day | ORAL | Status: DC
  Administered 2023-09-29 – 2023-10-01 (×3): 600 mg via ORAL
  Filled 2023-09-29 (×3): qty 2

## 2023-09-29 NOTE — Progress Notes (Signed)
   09/29/23 2029  BiPAP/CPAP/SIPAP  $ Non-Invasive Ventilator  Non-Invasive Vent Initial  $ Face Mask Small Yes  BiPAP/CPAP/SIPAP Pt Type Adult  BiPAP/CPAP/SIPAP Resmed  Mask Type Full face mask  Mask Size Small  EPAP 5 cmH2O  FiO2 (%) 36 %  Flow Rate 4 lpm  Patient Home Machine No  Patient Home Mask No  Patient Home Tubing No  Auto Titrate No  Device Plugged into RED Power Outlet Yes  BiPAP/CPAP /SiPAP Vitals  Pulse Rate (!) 51  Resp 18  SpO2 100 %  Bilateral Breath Sounds Clear;Diminished  MEWS Score/Color  MEWS Score 0  MEWS Score Color Ch[place] Si

## 2023-09-29 NOTE — H&P (Signed)
 Electrophysiology H&P  Note    Primary Care Physician: Skip Estimable, MD Primary Cardiologist: [name], MD Electrophysiologist: Sherryl Manges, MD  Referring Physician: Sherie Don NP   Debra Saunders is a 78 y.o. 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 Previously on amiodarone but this was discontinued due to pulmonary fibrosis on CT. Referred to the AF clinic for dofetilide admission. Patient is on Eliquis for stroke prevention.   Patient presents today for follow up for atrial fibrillation and dofetilide admission. She denies missed doses of anticoagulation. She has held her Marcelline Deist in case DCCV is needed. No bleeding issues on anticoagulation.   Today, she denies symptoms of palpitations, chest pain, orthopnea, PND, dizziness, presyncope, syncope, bleeding, or neurologic sequela. The patient is tolerating medications without difficulties and is otherwise without complaint today.    Atrial Fibrillation Risk Factors:  she does have symptoms or diagnosis of sleep apnea. she does not have a history of rheumatic fever.   Atrial Fibrillation Management history:  Previous antiarrhythmic drugs: amiodarone  Previous cardioversions: 10/12/17, 07/04/21, 03/27/23 Previous ablations: none Anticoagulation history: Eliquis  ROS- All systems are reviewed and negative except as per the HPI above.  Past Medical History:  Diagnosis Date   Acute hypokalemia 07/18/2023   Arthritis    Atrial fibrillation, persistent (HCC)    a. s/p TEE-guided DCCV on 08/20/2017; b. 06/2021 Recurrent Afib-->amio/TEE/DCCV.   CAD (coronary artery disease)    a. 01/2012 Cath: nonobs dzs; b. 04/2014 MV: No ischemia; c. 09/2017 Cath: Minimal nonobs RCA dzs, otw nl cors.    Cardiorenal syndrome    a. 09/2017 Creat rose to 2.7 w/ diuresis-->HD x 2.   Chronic back pain    Chronic combined systolic (congestive) and diastolic (congestive) heart failure (HCC)    a. Dx 2005 @ [facility];  b. 02/2014 Echo: EF 55-60%; c. 07/2017: EF 30-35%; d. 09/2017 Echo: EF 40-45%; e. 02/2018 Echo: EF 55-60%; f. 08/2019 Echo: EF 60-65%; g. 08/2020 RHC: RA 3, RV 30/1, PA 33/11 (20), PCWP 6; f. 08/2021 Echo: EF 60-65%, no rwma, GrIDD, nl RV fxn, RVSP 40.48mmHg, mod dil LA, mod TR.   Chronic respiratory failure (HCC)    a. on home O2   Gastritis    History of COVID-19    History of endocarditis 08/22/2021   History of intracranial hemorrhage    a. 6/14 described by neurosurgery as "small frontal posttraumatic SAH " - pt was on xarelto @ the time.   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 Chronic LLE edema.   NICM (nonischemic cardiomyopathy) (HCC)    a. 09/2017 Echo: EF 40-45%; b. 09/2017 Cath: minor, insignificant CAD; c. 02/2018 Echo: EF 55-60%, Gr1 DD; d. 08/2019 Echo: EF 60-65%, Gr2 DD.   Obesity    PAH (pulmonary artery hypertension) (HCC)    a. 09/2017 Echo: PASP ; b. 09/2017 RHC: RA 23, RV 84/13, PA 84/29, PCWP 20, CO 3.89, CI 1.89. LVEDP 18; c. 08/2020 RHC: RA 3, RV 30/1, PA 33/11 (20), PCWP 6. CO/CI 6.18/3.36. PVR 2.3 WU.   Presence of permanent cardiac pacemaker 01/09/2014   SBE (  subacute bacterial endocarditis)    a. 06/2021. Veg on PPM lead. Not candidate for extraction-->s/p 6 wks abx.   Sepsis with metabolic encephalopathy (HCC)    Sleep apnea    Streptococcal bacteremia    Stroke [facility]) 2014   Subarachnoid hemorrhage (HCC) 01/19/2013   Symptomatic bradycardia    a. s/p BSX PPM.   Thyroid nodule    Type II diabetes mellitus (HCC)    Urinary incontinence     Current Facility-Administered Medications  Medication Dose Route Frequency Provider Last Rate  Last Admin   0.9 %  sodium chloride infusion  250 mL Intravenous PRN Fenton, Clint R, PA       acetaminophen (TYLENOL) tablet 325-650 mg  325-650 mg Oral Q6H PRN Fenton, Clint R, PA       albuterol (ACCUNEB) nebulizer solution 0.63 mg  1 ampule Nebulization Q6H PRN Fenton, Clint R, PA       apixaban (ELIQUIS) tablet 5 mg  5 mg Oral BID Fenton, Clint R, PA       atropine 1 % ophthalmic solution 1 drop  1 drop Both Eyes QHS Fenton, Clint R, PA       cefadroxil (DURICEF) capsule 500 mg  500 mg Oral BID Fenton, Clint R, PA       dofetilide (TIKOSYN) capsule 250 mcg  250 mcg Oral BID Fenton, Clint R, PA       gabapentin (NEURONTIN) tablet 600 mg  600 mg Oral QHS Fenton, Clint R, PA       [START ON 09/30/2023] macitentan (OPSUMIT) tablet 10 mg  10 mg Oral Daily Fenton, Clint R, PA       [START ON 09/30/2023] macitentan (OPSUMIT) tablet 10 mg  10 mg Oral Daily Fenton, Clint R, PA       midodrine (PROAMATINE) tablet 5 mg  5 mg Oral TID WC Fenton, Clint R, PA       oxyCODONE (Oxy IR/ROXICODONE) immediate release tablet 5 mg  5 mg Oral Q4H PRN Fenton, Clint R, PA       potassium chloride SA (KLOR-CON M) CR tablet 20 mEq  20 mEq Oral QHS Fenton, Clint R, PA       [START ON 09/30/2023] potassium chloride SA (KLOR-CON M) CR tablet 40 mEq  40 mEq Oral Daily Fenton, Clint R, PA       prednisoLONE acetate (PRED FORTE) 1 % ophthalmic suspension 1 drop  1 drop Both Eyes BID Fenton, Clint R, PA       [START ON 09/30/2023] rosuvastatin (CRESTOR) tablet 10 mg  10 mg Oral Daily Fenton, Clint R, PA       Selexipag TABS 1,600 mcg  1,600 mcg Oral BID Fenton, Clint R, PA       sildenafil (REVATIO) tablet 20 mg  20 mg Oral TID Fenton, Clint R, PA       sodium chloride flush (NS) 0.9 % injection 3 mL  3 mL Intravenous Q12H Fenton, Clint R, PA       sodium chloride flush (NS) 0.9 % injection 3 mL  3 mL Intravenous PRN Fenton, Clint R, PA       [START ON 09/30/2023] spironolactone (ALDACTONE) tablet 25 mg  25 mg Oral Daily Fenton,  Clint R, PA       [START ON 09/30/2023] torsemide (DEMADEX) tablet 60 mg  60 mg Oral Daily Fenton, Clint R, PA        Physical Exam: BP 120/64  Pulse (!) 57  Ht 5' (  1.524 m)  Wt 87.5 kg  BMI 37.69 kg/m   GEN: Well nourished, well developed in no acute distress CARDIAC: Regular rate and rhythm, no murmurs, rubs, gallops RESPIRATORY:  Clear to auscultation without rales, wheezing or rhonchi, on nasal canula O2 ABDOMEN: Soft, non-tender, non-distended EXTREMITIES:  trace bilateral edema, No deformity   Wt Readings from Last 3 Encounters:  09/29/23 87.5 kg  09/23/23 87.5 kg  09/10/23 87.2 kg     EKG today demonstrates  V pacing with underlying afib Vent. rate 57 BPM PR interval * ms QRS duration 176 ms QT/QTcB 492/478 ms   Echo 08/23/21 demonstrated   1. Left ventricular ejection fraction, by estimation, is 60 to 65%. The  left ventricle has normal function. The left ventricle has no regional  wall motion abnormalities. There is mild left ventricular hypertrophy.  Left ventricular diastolic parameters are consistent with Grade I diastolic dysfunction (impaired relaxation).   2. Right ventricular systolic function is normal. The right ventricular  size is normal. There is mildly elevated pulmonary artery systolic  pressure. The estimated right ventricular systolic pressure is 40.3 mmHg.   3. Left atrial size was moderately dilated.   4. The mitral valve is normal in structure. No evidence of mitral valve  regurgitation. No evidence of mitral stenosis.   5. Tricuspid valve regurgitation is moderate.   6. The aortic valve is normal in structure. Aortic valve regurgitation is  not visualized. Aortic valve sclerosis/calcification is present, without  any evidence of aortic stenosis.   7. The inferior vena cava is normal in size with greater than 50%  respiratory variability, suggesting right atrial pressure of 3 mmHg.   8. RV pacer wire difficult to visualize, grossly no  vegetation noted.    CHA2DS2-VASc Score = 9  The patient's score is based upon: CHF History: 1 HTN History: 1 Diabetes History: 1 Stroke History: 2 Vascular Disease History: 1 Age Score: 2 Gender Score: 1       ASSESSMENT AND PLAN: Persistent Atrial Fibrillation (ICD10:  I48.19) The patient's CHA2DS2-VASc score is 9, indicating a 12.2% annual risk of stroke.   Amiodarone discontinued due to pulmonary fibrosis. Patient presents for dofetilide admission Continue Eliquis 5 mg BID, states no missed doses in the last 3 weeks. No recent benadryl use PharmD has screened medications for QT prolonging agents.  Labs today show creatinine at 1.24, K+ 4.2 and mag 2.1, CrCl calculated at 50 mL/min  Secondary Hypercoagulable State (ICD10:  D68.69) The patient is at significant risk for stroke/thromboembolism based upon her CHA2DS2-VASc Score of 9.  Continue Apixaban (Eliquis).   Chronic HFpEF EF 60-65% GDMT per [facility] team Fluid status appears stable today  OSA  Encouraged nightly CPAP She will need a machine while admitted.   HTN Stable on current regimen  Sinus node dysfunction S/p PPM, followed by Dr Graciela Husbands  On chronic antibiotics for lead vegetation.    Pt presents for tikosyn admission as above.   Casimiro Needle [address] [place], PA-C  09/29/2023 2:21 PM

## 2023-09-29 NOTE — Telephone Encounter (Signed)
 Patient Product/process development scientist completed.    The patient is insured through [employer]. Patient has Medicare and is not eligible for a copay card, but may be able to apply for patient assistance or Medicare RX Payment Plan (Patient Must reach out to their plan, if eligible for payment plan), if available.    Ran test claim for dofetilide (Tikosyn) 250 mcg and the current 30 day co-pay is $0.00.   This test claim was processed through [facility]- copay amounts may vary at other pharmacies due to pharmacy/plan contracts, or as the patient moves through the different stages of their insurance plan.     Roland Earl, CPHT Pharmacy Technician III Certified Patient Advocate [facility] Patient Advocate Team Direct Number: [phone]  Fax: [phone]

## 2023-09-29 NOTE — Plan of Care (Signed)

## 2023-09-29 NOTE — Progress Notes (Signed)
 Primary Care Physician: Skip Estimable, MD Primary Cardiologist: [name], MD Electrophysiologist: Sherryl Manges, MD  Referring Physician: Sherie Don NP   Debra Saunders is a 78 y.o. 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 Previously on amiodarone but this was discontinued due to pulmonary fibrosis on CT. Referred to the AF clinic for dofetilide admission. Patient is on Eliquis for stroke prevention.   Patient presents today for follow up for atrial fibrillation and dofetilide admission. She denies missed doses of anticoagulation. She has held her Marcelline Deist in case DCCV is needed. No bleeding issues on anticoagulation.   Today, she denies symptoms of palpitations, chest pain, orthopnea, PND, dizziness, presyncope, syncope, bleeding, or neurologic sequela. The patient is tolerating medications without difficulties and is otherwise without complaint today.    Atrial Fibrillation Risk Factors:  she does have symptoms or diagnosis of sleep apnea. she does not have a history of rheumatic fever.   Atrial Fibrillation Management history:  Previous antiarrhythmic drugs: amiodarone  Previous cardioversions: 10/12/17, 07/04/21, 03/27/23 Previous ablations: none Anticoagulation history: Eliquis  ROS- All systems are reviewed and negative except as per the HPI above.  Past Medical History:  Diagnosis Date   Acute hypokalemia 07/18/2023   Arthritis    Atrial fibrillation, persistent (HCC)    a. s/p TEE-guided DCCV on 08/20/2017; b. 06/2021 Recurrent Afib-->amio/TEE/DCCV.   CAD (coronary artery disease)    a. 01/2012 Cath: nonobs dzs; b. 04/2014 MV: No ischemia; c. 09/2017 Cath: Minimal nonobs RCA dzs, otw nl cors.   Cardiorenal syndrome    a. 09/2017 Creat  rose to 2.7 w/ diuresis-->HD x 2.   Chronic back pain    Chronic combined systolic (congestive) and diastolic (congestive) heart failure (HCC)    a. Dx 2005 @ [facility];  b. 02/2014 Echo: EF 55-60%; c. 07/2017: EF 30-35%; d. 09/2017 Echo: EF 40-45%; e. 02/2018 Echo: EF 55-60%; f. 08/2019 Echo: EF 60-65%; g. 08/2020 RHC: RA 3, RV 30/1, PA 33/11 (20), PCWP 6; f. 08/2021 Echo: EF 60-65%, no rwma, GrIDD, nl RV fxn, RVSP 40.28mmHg, mod dil LA, mod TR.   Chronic respiratory failure (HCC)    a. on home O2   Gastritis    History of COVID-19    History of endocarditis 08/22/2021   History of intracranial hemorrhage    a. 6/14 described by neurosurgery as "small frontal posttraumatic SAH " - pt was on xarelto @ the time.   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 Chronic LLE edema.   NICM (nonischemic cardiomyopathy) (HCC)    a. 09/2017 Echo: EF 40-45%; b. 09/2017 Cath: minor, insignificant CAD; c. 02/2018 Echo: EF 55-60%, Gr1 DD; d. 08/2019 Echo: EF 60-65%, Gr2 DD.   Obesity    PAH (pulmonary artery hypertension) (HCC)    a. 09/2017 Echo: PASP ; b. 09/2017 RHC: RA 23, RV 84/13, PA 84/29, PCWP 20, CO 3.89, CI 1.89. LVEDP 18; c. 08/2020 RHC: RA 3, RV 30/1, PA 33/11 (20), PCWP 6. CO/CI 6.18/3.36. PVR 2.3 WU.   Presence of permanent cardiac pacemaker 01/09/2014   SBE (subacute bacterial endocarditis)  a. 06/2021. Veg on PPM lead. Not candidate for extraction-->s/p 6 wks abx.   Sepsis with metabolic encephalopathy (HCC)    Sleep apnea    Streptococcal bacteremia    Stroke [facility]) 2014   Subarachnoid hemorrhage (HCC) 01/19/2013   Symptomatic bradycardia    a. s/p BSX PPM.   Thyroid nodule    Type II diabetes mellitus (HCC)    Urinary incontinence     Current Outpatient Medications  Medication Sig Dispense Refill   acetaminophen (TYLENOL) 325 MG tablet Take 325-650 mg by mouth every 6  (six) hours as needed for mild pain.     albuterol (ACCUNEB) 0.63 MG/3ML nebulizer solution Take 1 ampule by nebulization every 6 (six) hours as needed for wheezing.     apixaban (ELIQUIS) 5 MG TABS tablet Take 1 tablet (5 mg total) by mouth 2 (two) times daily. RESUME SUNDAY pm 180 tablet 3   atropine 1 % ophthalmic solution Place 1 drop into both eyes at bedtime.     cefadroxil (DURICEF) 500 MG capsule Take 1 capsule (500 mg total) by mouth 2 (two) times daily. 60 capsule 12   dapagliflozin propanediol (FARXIGA) 10 MG TABS tablet Take 1 tablet (10 mg total) by mouth daily before breakfast. 90 tablet 1   gabapentin (NEURONTIN) 600 MG tablet Take 600 mg by mouth at bedtime.     lidocaine (LIDODERM) 5 % Place 2 patches onto the skin daily. Remove & Discard patch within 12 hours or as directed by MD 30 patch 0   macitentan (OPSUMIT) 10 MG tablet Take 1 tablet (10 mg total) by mouth daily. 30 tablet 11   meclizine (ANTIVERT) 25 MG tablet Take 1 tablet (25 mg total) by mouth 3 (three) times daily as needed for dizziness. 30 tablet 1   Melatonin 1 MG CAPS Take 1 capsule (1 mg total) by mouth at bedtime. 30 capsule 0   Menthol, Topical Analgesic, (BIOFREEZE EX) Apply 1 application topically 4 (four) times daily as needed (for pain).     midodrine (PROAMATINE) 5 MG tablet Take 1 tablet (5 mg total) by mouth 3 (three) times daily with meals. 90 tablet 5   OHTUVAYRE 3 MG/2.5ML SUSP      oxyCODONE (OXY IR/ROXICODONE) 5 MG immediate release tablet Take 1 tablet (5 mg total) by mouth every 4 (four) hours as needed for moderate pain, breakthrough pain or severe pain. 12 tablet 0   OXYGEN Inhale 4 L/min into the lungs continuous.     potassium chloride SA (KLOR-CON M) 20 MEQ tablet Take every morning and 20mg  every evening. 60 tablet 1   prednisoLONE acetate (PRED FORTE) 1 % ophthalmic suspension Place 1 drop into both eyes 2 (two) times daily.     rosuvastatin (CRESTOR) 10 MG tablet Take 1 tablet (10 mg  total) by mouth daily. 90 tablet 3   Selexipag (UPTRAVI) 1600 MCG TABS Take 1 tablet (1,600 mcg total) by mouth 2 (two) times daily. 60 tablet 11   sildenafil (REVATIO) 20 MG tablet Take 1 tablet (20 mg total) by mouth 3 (three) times daily. NEEDS FOLLOW UP APPOINTMENT FOR ANYMORE REFILLS 90 tablet 0   spironolactone (ALDACTONE) 25 MG tablet Take 1 tablet by mouth daily.     torsemide (DEMADEX) 20 MG tablet Take 3 tablets (60 mg total) by mouth daily. 200 tablet 3   Vitamin D, Ergocalciferol, (DRISDOL) 1.25 MG (50000 UNIT) CAPS capsule Take 1 capsule (50,000 Units total) by mouth every 7 (seven) days. 9 capsule  0   Current Facility-Administered Medications  Medication Dose Route Frequency Provider Last Rate Last Admin   diclofenac Sodium (VOLTAREN) 1 % topical gel 2 g  2 g Topical QID         Physical Exam: BP 120/64   Pulse (!) 57   Ht 5' (1.524 m)   Wt 87.5 kg   BMI 37.69 kg/m   GEN: Well nourished, well developed in no acute distress CARDIAC: Regular rate and rhythm, no murmurs, rubs, gallops RESPIRATORY:  Clear to auscultation without rales, wheezing or rhonchi, on nasal canula O2 ABDOMEN: Soft, non-tender, non-distended EXTREMITIES:  trace bilateral edema, No deformity   Wt Readings from Last 3 Encounters:  09/29/23 87.5 kg  09/23/23 87.5 kg  09/10/23 87.2 kg     EKG today demonstrates  V pacing with underlying afib Vent. rate 57 BPM PR interval * ms QRS duration 176 ms QT/QTcB 492/478 ms   Echo 08/23/21 demonstrated   1. Left ventricular ejection fraction, by estimation, is 60 to 65%. The  left ventricle has normal function. The left ventricle has no regional  wall motion abnormalities. There is mild left ventricular hypertrophy.  Left ventricular diastolic parameters are consistent with Grade I diastolic dysfunction (impaired relaxation).   2. Right ventricular systolic function is normal. The right ventricular  size is normal. There is mildly elevated pulmonary  artery systolic  pressure. The estimated right ventricular systolic pressure is 40.3 mmHg.   3. Left atrial size was moderately dilated.   4. The mitral valve is normal in structure. No evidence of mitral valve  regurgitation. No evidence of mitral stenosis.   5. Tricuspid valve regurgitation is moderate.   6. The aortic valve is normal in structure. Aortic valve regurgitation is  not visualized. Aortic valve sclerosis/calcification is present, without  any evidence of aortic stenosis.   7. The inferior vena cava is normal in size with greater than 50%  respiratory variability, suggesting right atrial pressure of 3 mmHg.   8. RV pacer wire difficult to visualize, grossly no vegetation noted.    CHA2DS2-VASc Score = 9  The patient's score is based upon: CHF History: 1 HTN History: 1 Diabetes History: 1 Stroke History: 2 Vascular Disease History: 1 Age Score: 2 Gender Score: 1       ASSESSMENT AND PLAN: Persistent Atrial Fibrillation (ICD10:  I48.19) The patient's CHA2DS2-VASc score is 9, indicating a 12.2% annual risk of stroke.   Amiodarone discontinued due to pulmonary fibrosis. Patient presents for dofetilide admission Continue Eliquis 5 mg BID, states no missed doses in the last 3 weeks. No recent benadryl use PharmD has screened medications for QT prolonging agents.  Labs today show creatinine at 1.24, K+ 4.2 and mag 2.1, CrCl calculated at 50 mL/min  Secondary Hypercoagulable State (ICD10:  D68.69) The patient is at significant risk for stroke/thromboembolism based upon her CHA2DS2-VASc Score of 9.  Continue Apixaban (Eliquis).   Chronic HFpEF EF 60-65% GDMT per [facility] team Fluid status appears stable today  OSA  Encouraged nightly CPAP She will need a machine while admitted.   HTN Stable on current regimen  Sinus node dysfunction S/p PPM, followed by Dr Graciela Husbands  On chronic antibiotics for lead vegetation.    To be admitted later today once a bed becomes  available.        Jorja Loa PA-C Afib Clinic [facility] [name] [place], [place] [address] [phone]

## 2023-09-29 NOTE — Progress Notes (Signed)
 Pharmacy: Dofetilide (Tikosyn) - Initial Consult Assessment and Electrolyte Replacement  Pharmacy consulted to assist in monitoring and replacing electrolytes in this 78 y.o. female admitted on 09/29/2023 undergoing dofetilide initiation. First dofetilide dose: 250 mcg 4/8 PM  Assessment:  Patient Exclusion Criteria: If any screening criteria checked as "Yes", then  patient  should NOT receive dofetilide until criteria item is corrected.  If "Yes" please indicate correction plan.  YES  NO Patient  Exclusion Criteria Correction Plan/Comments   []   [x]   Baseline QTc interval is greater than or equal to 440 msec. IF above YES box checked dofetilide contraindicated unless patient has ICD; then may proceed if QTc 500-550 msec or with known ventricular conduction abnormalities may proceed with QTc 550-600 msec. QTc = 478 ms per AF clinic note    []   [x]   Patient is known or suspected to have a digoxin level greater than 2 ng/ml: Lab Results  Component Value Date   DIGOXIN 1.5 08/26/2021       []   [x]   Creatinine clearance less than 20 ml/min (calculated using Cockcroft-Gault, actual body weight and serum creatinine): Calculated CrCl = 50 mL/min using ABW     []   [x]  Patient has received drugs known to prolong the QT intervals within the last 48 hour (examples: phenothiazines, tricyclics or tetracyclic antidepressants, macrolides, 1st generation H-1 antihistamines (especially diphenhydramine), fluoroquinolones, azoles, ondansetron, metoclopramide, promethazine).   Updated information on QT prolonging agents is available to be searched on the following database:QT prolonging agents -If SSRI or antihistamine needed, preferred options are sertraline and loratadine respectively     []   [x]  Patient received a dose of a thiazide diuretic in the last 48 hours [including hydrochlorothiazide (Oretic) alone or in any combination including triamterene (Dyazide, Maxzide)].    []   [x]  Patient  received a medication known to increase dofetilide plasma concentrations prior to initial dofetilide dose:  Trimethoprim (Primsol, Proloprim) in the last 36 hours Verapamil (Calan, Verelan) in the last 36 hours or a sustained release dose in the last 72 hours Megestrol (Megace) in the last 5 days  Cimetidine (Tagamet) in the last 6 hours Ketoconazole (Nizoral) in the last 24 hours Itraconazole (Sporanox) in the last 48 hours  Prochlorperazine (Compazine) in the last 36 hours     []   [x]   Patient is known to have a history of torsades de pointes; congenital or acquired long QT syndromes.    []   [x]   Patient has received a Class 1 and Class 3 antiarrhythmic with less than 2 half-lives since last dose. (Disopyramide, Quinidine, Procainamide, Lidocaine, Mexiletine, Flecainide, Propafenone, Sotalol, Dronedarone)    []   [x]   Patient has received amiodarone therapy in the past 3 months or amiodarone level is greater than 0.3 ng/ml.    Labs:    Component Value Date/Time   K 4.2 09/29/2023 1031   K 3.2 (L) 12/28/2013 1300   MG 2.1 09/29/2023 1031   MG 1.7 (L) 11/18/2013 0724     Plan: Select One Calculated CrCl  Dose q12h  []  > 60 ml/min 500 mcg  [x]  40-60 ml/min 250 mcg  []  20-40 ml/min 125 mcg   []   Physician selected initial dose within range recommended for patients level of renal function - will monitor for response.  []   Physician selected initial dose outside of range recommended for patients level of renal function - will discuss if the dose should be altered at this time.   Patient has been appropriately anticoagulated with Eliquis  5 mg BID.  Potassium: K >/= 4: Appropriate to initiate Tikosyn, no replacement needed    Magnesium: Mg >2: Appropriate to initiate Tikosyn, no replacement needed     Thank you for allowing pharmacy to participate in this patient's care   Trixie Rude, PharmD Clinical Pharmacist 09/29/2023  2:59 PM

## 2023-09-30 ENCOUNTER — Other Ambulatory Visit (HOSPITAL_COMMUNITY): Payer: Self-pay

## 2023-09-30 DIAGNOSIS — I4819 Other persistent atrial fibrillation: Secondary | ICD-10-CM | POA: Diagnosis not present

## 2023-09-30 LAB — BASIC METABOLIC PANEL WITH GFR
Anion gap: 9 (ref 5–15)
BUN: 25 mg/dL — ABNORMAL HIGH (ref 8–23)
CO2: 26 mmol/L (ref 22–32)
Calcium: 8.6 mg/dL — ABNORMAL LOW (ref 8.9–10.3)
Chloride: 104 mmol/L (ref 98–111)
Creatinine, Ser: 1.22 mg/dL — ABNORMAL HIGH (ref 0.44–1.00)
GFR, Estimated: 45 mL/min — ABNORMAL LOW (ref >60)
Glucose, Bld: 92 mg/dL (ref 70–99)
Potassium: 4.4 mmol/L (ref 3.5–5.1)
Sodium: 139 mmol/L (ref 135–145)

## 2023-09-30 LAB — MAGNESIUM: Magnesium: 2.2 mg/dL (ref 1.7–2.4)

## 2023-09-30 MED ORDER — PSYLLIUM 95 % PO PACK
1.0000 | PACK | Freq: Two times a day (BID) | ORAL | Status: DC
  Administered 2023-09-30 – 2023-10-02 (×4): 1 via ORAL
  Filled 2023-09-30 (×6): qty 1

## 2023-09-30 NOTE — Progress Notes (Signed)
 Chaplain responded to a request in the consult list, to provide information about the Advance Directives form. Pt received information about the form and requested more time to fill the form, when Pt's daughter come to see Pt in the afternoon. Chaplain asked Pt to notify this office when the form is ready for notarization. Pt and Chaplain also had an opportunity to explore Pt's emotions and feelings while being at the hospital. Pt feels confident to continue her purpose of providing wisdom to her family and see how the family she has raised continue the journey in life. Pt was grateful for Chaplain's visit.  Oneida Alar Chaplain Resident   09/30/23 [phone]  Spiritual Encounters  Type of Visit Initial  Care provided to: Patient  Conversation partners present during encounter Nurse  Referral source Clinical staff  Reason for visit Advance directives  OnCall Visit No

## 2023-09-30 NOTE — Progress Notes (Signed)
 Morning EKG reviewed     Shows pt remains in afib with stable QTc at ~460-470 ms when corrected for her QRS  Continue  Tikosyn 250 mcg BID.   Potassium4.4 (04/09 0425) Magnesium  2.2 (04/09 0425) Creatinine, ser  1.22* (04/09 0425)  Pt will be NPO after midnight for DCCV if remains in afib   Graciella Freer, PA-C  09/30/2023 11:53 AM

## 2023-09-30 NOTE — Progress Notes (Signed)
 Monitoring by Pharmacy for Pulmonary Hypertension Treatment   Indication - Continuation of prior to admission medication   Patient is 78 y.o.  with history of PAH on chronic macitentan (OPSUMIT) PTA and will be continued while hospitalized.   Continuing this medication order as an inpatient requires that monitoring parameters per REMS requirements must be met.  Chronic therapy is under the supervision of Dr. Shirlee Latch who is enrolled in the REMS program (REMS Provider ID [phone]) and is being notified of continuation of therapy. A staff message in EPIC has been sent notifying the certified prescriber.  The patient's authorization code or REMS patient ID as provided by the REMS program is [id number]. Per patient report has previously been educated on Pregnancy risk and Hepatotoxicity.  On admission pregnancy risk has been assessed and Patient is female and cannot get pregnant, and birth control option is appropriately not indicated as an option has been confirmed as birth control option for this patient.  Hepatic function has been evaluated. AST / ALT appropriate to continue medication at this time.     Latest Ref Rng & Units 09/29/2023    2:33 PM 11/18/2022    1:02 PM 10/21/2022    4:04 AM  Hepatic Function  Total Protein 6.5 - 8.1 g/dL 6.8  6.8  6.7   Albumin 3.5 - 5.0 g/dL 3.8  3.6  3.7   AST 15 - 41 U/L 13  18  16    ALT 0 - 44 U/L 9  15  13    Alk Phosphatase 38 - 126 U/L 83  97  102   Total Bilirubin 0.0 - 1.2 mg/dL 0.6  0.4  0.6   Bilirubin, Direct 0.0 - 0.2 mg/dL 0.1   0.1     If any question arise or pregnancy is identified during hospitalization, contact for bosentan: [phone]; macitentan: [phone]; ambrisentan: [phone].  Thank for you allowing [place] to participate in the care of this patient.  Kirk Ruths Paytes 09/30/2023, 1:15 PM Clinical Pharmacist  Guides for Female Patient:  ambrisentan (LETAIRIS), macitentan (OPSUMIT), bosentan (TRACLEER).

## 2023-09-30 NOTE — Progress Notes (Signed)
 Post Tikosyn EKG shows VP 55 Qtc 493

## 2023-09-30 NOTE — H&P (View-Only) (Signed)
 Morning EKG reviewed     Shows pt remains in afib with stable QTc at ~460-470 ms when corrected for her QRS  Continue  Tikosyn 250 mcg BID.   Potassium4.4 (04/09 0425) Magnesium  2.2 (04/09 0425) Creatinine, ser  1.22* (04/09 0425)  Pt will be NPO after midnight for DCCV if remains in afib   Graciella Freer, PA-C  09/30/2023 11:53 AM

## 2023-09-30 NOTE — Progress Notes (Signed)
 Pharmacy: Dofetilide (Tikosyn) - Follow Up Assessment and Electrolyte Replacement  Pharmacy consulted to assist in monitoring and replacing electrolytes in this 78 y.o. female admitted on 09/29/2023 undergoing dofetilide initiation. First dofetilide dose: 250 mcg BID 4/8 PM  Labs:    Component Value Date/Time   K 4.4 09/30/2023 0425   K 3.2 (L) 12/28/2013 1300   MG 2.2 09/30/2023 0425   MG 1.7 (L) 11/18/2013 0724     Plan: Potassium: K >/= 4: No additional supplementation needed on PTA regimen of 40 mEq qAM, 20 mEq qHS  Magnesium: Mg > 2: No additional supplementation needed   Thank you for allowing pharmacy to participate in this patient's care   Trixie Rude, PharmD Clinical Pharmacist 09/30/2023  7:06 AM

## 2023-09-30 NOTE — Anesthesia Preprocedure Evaluation (Signed)
 Anesthesia Evaluation  Patient identified by MRN, date of birth, ID band Patient awake  General Assessment Comment:Patient had vomiting two weeks ago, none since then. Has had persistent abdominal pain since then.  Reviewed: Allergy & Precautions, NPO status , Patient's Chart, lab work & pertinent test results  History of Anesthesia Complications Negative for: history of anesthetic complications  Airway Mallampati: II  TM Distance: >3 FB Neck ROM: Full    Dental no notable dental hx. (+) Teeth Intact   Pulmonary sleep apnea, Continuous Positive Airway Pressure Ventilation and Oxygen sleep apnea , neg COPD, Patient abstained from smoking.Not current smoker, former smoker Interstitial lung disease on home O2   Pulmonary exam normal breath sounds clear to auscultation       Cardiovascular Exercise Tolerance: Poor METShypertension, pulmonary hypertension+ CAD and +CHF (NICM)  (-) Past MI Normal cardiovascular exam+ dysrhythmias (a fib on Eliquis) Atrial Fibrillation + pacemaker (symptomatic bradycardia)  Rhythm:Regular Rate:Normal - Systolic murmurs ECG 03/23/23:  Atrial fibrillation with frequent ventricular-paced complexes Low voltage QRS ST & T wave abnormality, consider inferolateral ischemia  ECHO 3/23 1. Left ventricular ejection fraction, by estimation, is 60 to 65%. The  left ventricle has normal function. The left ventricle has no regional  wall motion abnormalities. There is mild left ventricular hypertrophy.  Left ventricular diastolic parameters  are consistent with Grade I diastolic dysfunction (impaired relaxation).   2. Right ventricular systolic function is normal. The right ventricular  size is normal. There is mildly elevated pulmonary artery systolic  pressure. The estimated right ventricular systolic pressure is 40.3 mmHg.   3. Left atrial size was moderately dilated.   4. The mitral valve is normal in structure.  No evidence of mitral valve  regurgitation. No evidence of mitral stenosis.   5. Tricuspid valve regurgitation is moderate.   6. The aortic valve is normal in structure. Aortic valve regurgitation is  not visualized. Aortic valve sclerosis/calcification is present, without  any evidence of aortic stenosis.   7. The inferior vena cava is normal in size with greater than 50%  respiratory variability, suggesting right atrial pressure of 3 mmHg.   8. RV pacer wire difficult to visualize, grossly no vegetation noted.     Neuro/Psych Hx small SAH CVA (2014), No Residual Symptoms  negative psych ROS   GI/Hepatic ,neg GERD  ,,(+)     (-) substance abuse    Endo/Other  diabetes, Type 2  Obesity   Renal/GU CRFRenal disease (stage III CKD)     Musculoskeletal  (+) Arthritis ,    Abdominal  (+) + obese  Peds  Hematology  (+) Blood dyscrasia, anemia   Anesthesia Other Findings    Reproductive/Obstetrics                             Anesthesia Physical Anesthesia Plan  ASA: 4  Anesthesia Plan: General   Post-op Pain Management: Minimal or no pain anticipated   Induction: Intravenous  PONV Risk Score and Plan: 3 and Propofol infusion and Treatment may vary due to age or medical condition  Airway Management Planned: Natural Airway and Simple Face Mask  Additional Equipment: None  Intra-op Plan:   Post-operative Plan:   Informed Consent: I have reviewed the patients History and Physical, chart, labs and discussed the procedure including the risks, benefits and alternatives for the proposed anesthesia with the patient or authorized representative who has indicated his/her understanding and acceptance.  Dental advisory given  Plan Discussed with: CRNA and Anesthesiologist  Anesthesia Plan Comments:         Anesthesia Quick Evaluation

## 2023-09-30 NOTE — Progress Notes (Cosign Needed Addendum)
 Electrophysiology Rounding Note  Patient Name: Debra Saunders Date of Encounter: 09/30/2023  Primary Cardiologist: [name], MD  Electrophysiologist: Sherryl Manges, MD    Subjective   Pt remains in afib / V paced on Tikosyn 250 mcg BID   QTc from EKG last pm shows stable QTc at 440-450 when corrected for QRS  The patient is doing well today.  At this time, the patient denies chest pain, shortness of breath, or any new concerns.  Inpatient Medications    Scheduled Meds:  apixaban  5 mg Oral BID   atropine  1 drop Both Eyes QHS   cefadroxil  500 mg Oral BID   dofetilide  250 mcg Oral BID   gabapentin  600 mg Oral QHS   macitentan  10 mg Oral Daily   midodrine  5 mg Oral TID WC   potassium chloride  20 mEq Oral QHS   potassium chloride SA  40 mEq Oral Daily   prednisoLONE acetate  1 drop Both Eyes BID   rosuvastatin  10 mg Oral Daily   Selexipag  1,600 mcg Oral BID   sildenafil  20 mg Oral TID   sodium chloride flush  3 mL Intravenous Q12H   spironolactone  25 mg Oral Daily   torsemide  60 mg Oral Daily   Continuous Infusions:  sodium chloride     PRN Meds: sodium chloride, acetaminophen, albuterol, oxyCODONE, sodium chloride flush   Vital Signs    Vitals:   09/29/23 2029 09/29/23 2048 09/30/23 0014 09/30/23 0426  BP:  127/69 (!) 120/57 116/68  Pulse: (!) 51 (!) 52 (!) 56 (!) 51  Resp: 18 20 16 20   Temp:  98 F (36.7 C) 97.9 F (36.6 C) 97.6 F (36.4 C)  TempSrc:  Oral Oral Oral  SpO2: 100% 100% 100% 100%  Weight:      Height:       No intake or output data in the 24 hours ending 09/30/23 0705 Filed Weights   09/29/23 1427  Weight: 87.7 kg    Physical Exam    GEN- NAD, A&O x 3. Normal affect.  Lungs- CTAB, Normal effort.  Heart- Regular rate and rhythm (V paced). No M/G/R GI- Soft, NT, ND Extremities- No clubbing, cyanosis, or edema Skin- no rash or lesion  Labs    CBC No results for input(s): "WBC", "NEUTROABS", "HGB", "HCT", "MCV",  "PLT" in the last 72 hours. Basic Metabolic Panel Recent Labs    40/98/11 1031 09/30/23 0425  NA 139 139  K 4.2 4.4  CL 105 104  CO2 26 26  GLUCOSE 96 92  BUN 22 25*  CREATININE 1.24* 1.22*  CALCIUM 9.0 8.6*  MG 2.1 2.2    Telemetry    AF with V pacing 50s (personally reviewed)  Patient Profile     Debra Saunders is a 78 y.o. female with a past medical history significant for persistent atrial fibrillation.  They were admitted for tikosyn load.   Assessment & Plan    Persistent atrial fibrillation Pt remains in afib on Tikosyn 250 mcg BID  Continue Eliquis Creatinine, ser  1.22* (04/09 0425) Magnesium  2.2 (04/09 0425) Potassium4.4 (04/09 0425) No electrolyte supplementation needed  If pt does not convert chemically, plan on DCCV tomorrow   OSA  Encouraged nightly CPAP   IBS-D Takes imodium EVERY day, sometimes twice if she is having symptoms.  Agreeable to trying Metamucil today.  If that doesn't work, can consider Lomotil, but  will need follow up for chronic prescription.   For questions or updates, please contact [facility] Please consult [email or web address] for contact info under Cardiology/STEMI.  Signed, Graciella Freer, PA-C  09/30/2023, 7:05 AM

## 2023-10-01 ENCOUNTER — Ambulatory Visit: Payer: Medicare HMO

## 2023-10-01 ENCOUNTER — Inpatient Hospital Stay (HOSPITAL_COMMUNITY): Payer: Self-pay | Admitting: Anesthesiology

## 2023-10-01 ENCOUNTER — Encounter (HOSPITAL_COMMUNITY)
Admission: AD | Disposition: A | Discharge status: Home / Self Care | Payer: Self-pay | Source: Ambulatory Visit | Attending: Internal Medicine

## 2023-10-01 DIAGNOSIS — I4819 Other persistent atrial fibrillation: Secondary | ICD-10-CM

## 2023-10-01 DIAGNOSIS — I509 Heart failure, unspecified: Secondary | ICD-10-CM | POA: Diagnosis not present

## 2023-10-01 DIAGNOSIS — I4891 Unspecified atrial fibrillation: Secondary | ICD-10-CM | POA: Diagnosis not present

## 2023-10-01 DIAGNOSIS — Z79899 Other long term (current) drug therapy: Secondary | ICD-10-CM | POA: Diagnosis not present

## 2023-10-01 DIAGNOSIS — D688 Other specified coagulation defects: Secondary | ICD-10-CM

## 2023-10-01 DIAGNOSIS — I2510 Atherosclerotic heart disease of native coronary artery without angina pectoris: Secondary | ICD-10-CM

## 2023-10-01 DIAGNOSIS — I110 Hypertensive heart disease with heart failure: Secondary | ICD-10-CM | POA: Diagnosis not present

## 2023-10-01 DIAGNOSIS — I495 Sick sinus syndrome: Secondary | ICD-10-CM

## 2023-10-01 HISTORY — PX: CARDIOVERSION: EP1203

## 2023-10-01 LAB — MAGNESIUM: Magnesium: 2.1 mg/dL (ref 1.7–2.4)

## 2023-10-01 LAB — BASIC METABOLIC PANEL WITH GFR
Anion gap: 10 (ref 5–15)
BUN: 28 mg/dL — ABNORMAL HIGH (ref 8–23)
CO2: 25 mmol/L (ref 22–32)
Calcium: 8.8 mg/dL — ABNORMAL LOW (ref 8.9–10.3)
Chloride: 104 mmol/L (ref 98–111)
Creatinine, Ser: 1.24 mg/dL — ABNORMAL HIGH (ref 0.44–1.00)
GFR, Estimated: 44 mL/min — ABNORMAL LOW (ref >60)
Glucose, Bld: 90 mg/dL (ref 70–99)
Potassium: 4.3 mmol/L (ref 3.5–5.1)
Sodium: 139 mmol/L (ref 135–145)

## 2023-10-01 SURGERY — CARDIOVERSION (CATH LAB)
Anesthesia: General

## 2023-10-01 MED ORDER — PROPOFOL 10 MG/ML IV BOLUS
INTRAVENOUS | Status: DC | PRN
  Administered 2023-10-01: 40 mg via INTRAVENOUS

## 2023-10-01 SURGICAL SUPPLY — 1 items: PAD DEFIB RADIO PHYSIO CONN (PAD) ×1 IMPLANT

## 2023-10-01 NOTE — Progress Notes (Signed)
  Progress Note   Date: 10/01/2023  Patient Name: Debra Saunders        MRN#: [id number]   Clarification of diagnosis:   Chronic hypoxic respiratory failure  CKD IIIa Obesity Class II (Body mass index is 37.77 kg/m.)

## 2023-10-01 NOTE — Plan of Care (Signed)
   Problem: Education: Goal: Knowledge of General Education information will improve Description Including pain rating scale, medication(s)/side effects and non-pharmacologic comfort measures Outcome: Progressing

## 2023-10-01 NOTE — Interval H&P Note (Signed)
 History and Physical Interval Note:  10/01/2023 8:40 AM  Debra Saunders  has presented today for surgery, with the diagnosis of afib.  The various methods of treatment have been discussed with the patient and family. After consideration of risks, benefits and other options for treatment, the patient has consented to  Procedure(s): CARDIOVERSION (N/A) as a surgical intervention.  The patient's history has been reviewed, patient examined, no change in status, stable for surgery.  I have reviewed the patient's chart and labs.  Questions were answered to the patient's satisfaction.     Yardley Lekas [employer]

## 2023-10-01 NOTE — Plan of Care (Signed)
   Problem: Activity: Goal: Risk for activity intolerance will decrease Outcome: Progressing   Problem: Coping: Goal: Level of anxiety will decrease Outcome: Progressing   Problem: Safety: Goal: Ability to remain free from injury will improve Outcome: Progressing

## 2023-10-01 NOTE — TOC CM/SW Note (Signed)
 Transition of Care [facility]) - Inpatient Brief Assessment   Patient Details  Name: Debra Saunders MRN: [id number] Date of Birth: 1946/06/23  Transition of Care [facility]) CM/SW Contact:    Gala Lewandowsky, RN Phone Number: 10/01/2023, 11:20 AM   Clinical Narrative: Patient presented for Tikosyn Load. Case Manager spoke with the patient regarding co pay cost. Patient is agreeable to cost and would like to have the initial Rx filled via [facility] and the Rx refills 90 day supply  escribed to [facility] [address] No further needs identified at this time. Case Manager will continue to follow for additional transition of care needs.    Transition of Care Asessment: Insurance and Status: Insurance coverage has been reviewed Patient has primary care physician: Yes Home environment has been reviewed: Reviewed- lives alone with family support of daughter and grandsons. Prior level of function:: independent with DME: rollator, CPAP and oxygen via [employer]. Prior/Current Home Services: No current home services Social Drivers of Health Review: SDOH reviewed no interventions necessary Readmission risk has been reviewed: Yes Transition of care needs: no transition of care needs at this time

## 2023-10-01 NOTE — Procedures (Signed)
 Electrical Cardioversion Procedure Note Debra Saunders [id number] [birth year]  Procedure: Electrical Cardioversion Indications:  Atrial Fibrillation  Procedure Details Consent: Risks of procedure as well as the alternatives and risks of each were explained to the (patient/caregiver).  Consent for procedure obtained. Time Out: Verified patient identification, verified procedure, site/side was marked, verified correct patient position, special equipment/implants available, medications/allergies/relevent history reviewed, required imaging and test results available.  Performed  Patient placed on cardiac monitor, pulse oximetry, supplemental oxygen as necessary.  Sedation given:  Propofol per anesthesiology Pacer pads placed anterior and posterior chest.  Cardioverted 2 times with sternal pressure.  Cardioverted at 360J.  Evaluation Findings: Post procedure EKG shows: NSR, confirmed by device interrogation.  Complications: None Patient did tolerate procedure well.   Marca Ancona 10/01/2023, 9:06 AM

## 2023-10-01 NOTE — Anesthesia Postprocedure Evaluation (Signed)
 Anesthesia Post Note  Patient: Debra Saunders  Procedure(s) Performed: CARDIOVERSION     Patient location during evaluation: PACU Anesthesia Type: General Level of consciousness: awake and alert Pain management: pain level controlled Vital Signs Assessment: post-procedure vital signs reviewed and stable Respiratory status: spontaneous breathing, nonlabored ventilation, respiratory function stable and patient connected to nasal cannula oxygen Cardiovascular status: blood pressure returned to baseline and stable Postop Assessment: no apparent nausea or vomiting Anesthetic complications: no   No notable events documented.  Last Vitals:  Vitals:   10/01/23 0930 10/01/23 1149  BP: (!) 102/59 (!) 108/48  Pulse: (!) 52 (!) 54  Resp: 14 18  Temp:  36.8 C  SpO2: 99% 99%    Last Pain:  Vitals:   10/01/23 1149  TempSrc: Oral  PainSc: 0-No pain                 Estil Vallee

## 2023-10-01 NOTE — Progress Notes (Signed)
 Pharmacy: Dofetilide (Tikosyn) - Follow Up Assessment and Electrolyte Replacement  Pharmacy consulted to assist in monitoring and replacing electrolytes in this 78 y.o. female admitted on 09/29/2023 undergoing dofetilide initiation.   Labs:    Component Value Date/Time   K 4.3 10/01/2023 0549   K 3.2 (L) 12/28/2013 1300   MG 2.1 10/01/2023 0549   MG 1.7 (L) 11/18/2013 0724     Plan: Potassium: K >/= 4: No additional supplementation needed  Magnesium: Mg > 2: No additional supplementation needed   At discharge, would continue oral potassium in am and in pm (her home regimen)  Thank you for allowing pharmacy to participate in this patient's care   Harland German, PharmD Clinical Pharmacist **Pharmacist phone directory can now be found on [email or web address] ([email or web address]).  Listed under [facility].

## 2023-10-01 NOTE — Progress Notes (Signed)
 Morning EKG reviewed     Shows is in NSR s/p DCC with stable QTc at 450-460 ms when corrected for QRS  Continue  Tikosyn 250 mcg BID.   Potassium4.3 (04/10 0549) Magnesium  2.1 (04/10 0549) Creatinine, ser  1.24* (04/10 0549)  Plan for home Friday if QTc remains stable   Graciella Freer, [place]  10/01/2023 11:06 AM

## 2023-10-01 NOTE — Transfer of Care (Signed)
 Immediate Anesthesia Transfer of Care Note  Patient: Debra Saunders  Procedure(s) Performed: CARDIOVERSION  Patient Location: PACU  Anesthesia Type:General  Level of Consciousness: awake  Airway & Oxygen Therapy: Patient Spontanous Breathing and Patient connected to face mask oxygen  Post-op Assessment: Report given to RN and Post -op Vital signs reviewed and stable  Post vital signs: Reviewed and stable  Last Vitals:  Vitals Value Taken Time  BP    Temp    Pulse    Resp    SpO2      Last Pain:  Vitals:   10/01/23 0847  TempSrc:   PainSc: 0-No pain      Patients Stated Pain Goal: 0 (09/30/23 0853)  Complications: No notable events documented.

## 2023-10-01 NOTE — Progress Notes (Signed)
 Electrophysiology Rounding Note  Patient Name: Debra Saunders Date of Encounter: 10/01/2023  Primary Cardiologist: [name], MD  Electrophysiologist: Sherryl Manges, MD    Subjective   Pt remains in afib on Tikosyn 250 mcg BID   QTc from EKG last pm shows stable QTc at 450-460 when corrected for QRS.  The patient is doing well today.  At this time, the patient denies chest pain, shortness of breath, or any new concerns.  Inpatient Medications    Scheduled Meds:  apixaban  5 mg Oral BID   atropine  1 drop Both Eyes QHS   cefadroxil  500 mg Oral BID   dofetilide  250 mcg Oral BID   gabapentin  600 mg Oral QHS   macitentan  10 mg Oral Daily   midodrine  5 mg Oral TID WC   potassium chloride  20 mEq Oral QHS   potassium chloride SA  40 mEq Oral Daily   prednisoLONE acetate  1 drop Both Eyes BID   psyllium  1 packet Oral BID   rosuvastatin  10 mg Oral Daily   Selexipag  1,600 mcg Oral BID   sildenafil  20 mg Oral TID   sodium chloride flush  3 mL Intravenous Q12H   spironolactone  25 mg Oral Daily   torsemide  60 mg Oral Daily   Continuous Infusions:  PRN Meds: acetaminophen, albuterol, oxyCODONE, sodium chloride flush   Vital Signs    Vitals:   09/30/23 1922 09/30/23 2338 10/01/23 0348 10/01/23 0804  BP: (!) 120/53 (!) 126/58 (!) 106/55 (!) 110/56  Pulse: (!) 58 (!) 51 (!) 53   Resp: 20 16 20 15   Temp: 98.2 F (36.8 C) 98.3 F (36.8 C) 98.5 F (36.9 C) 98.1 F (36.7 C)  TempSrc: Oral Oral Oral Oral  SpO2: 96% 99% 97%   Weight:      Height:        Intake/Output Summary (Last 24 hours) at 10/01/2023 0831 Last data filed at 09/30/2023 1300 Gross per 24 hour  Intake 240 ml  Output 600 ml  Net -360 ml   Filed Weights   09/29/23 1427  Weight: 87.7 kg    Physical Exam    GEN- NAD, A&O x 3. Normal affect.  Lungs- CTAB, Normal effort.  Heart- Regular rate and rhythm. No M/G/R GI- Soft, NT, ND Extremities- No clubbing, cyanosis, or edema Skin-  no rash or lesion  Labs    CBC No results for input(s): "WBC", "NEUTROABS", "HGB", "HCT", "MCV", "PLT" in the last 72 hours. Basic Metabolic Panel Recent Labs    16/10/96 0425 10/01/23 0549  NA 139 139  K 4.4 4.3  CL 104 104  CO2 26 25  GLUCOSE 92 90  BUN 25* 28*  CREATININE 1.22* 1.24*  CALCIUM 8.6* 8.8*  MG 2.2 2.1    Telemetry    AF with intermittent V pacing (personally reviewed)  Patient Profile     Debra Saunders is a 78 y.o. female with a past medical history significant for persistent atrial fibrillation.  They were admitted for tikosyn load.   Assessment & Plan    Persistent atrial fibrillation Pt remains in afib on Tikosyn 250 mcg BID  Continue Eliquis Creatinine, ser  1.24* (04/10 0549) Magnesium  2.1 (04/10 0549) Potassium4.3 (04/10 0549) No electrolyte supplementation needed  OSA  Encouraged nightly CPAP   For cardioversion this am.    For questions or updates, please contact [facility] Please consult [email or web address]  for contact info under Cardiology/STEMI.  Signed, Graciella Freer, PA-C  10/01/2023, 8:31 AM

## 2023-10-01 NOTE — Discharge Instructions (Signed)
 Dofetilide Capsules What is this medication? DOFETILIDE (doe FET il ide) treats a fast or irregular heartbeat (arrhythmia). It works by slowing down overactive electric signals in the heart, which stabilizes your heart rhythm. It belongs to a group of medications called antiarrhythmics. This medicine may be used for other purposes; ask your health care provider or pharmacist if you have questions. COMMON BRAND NAME(S): Tikosyn What should I tell my care team before I take this medication? They need to know if you have any of these conditions: Heart disease History of irregular heartbeat History of low levels of potassium or magnesium in the blood Kidney disease Liver disease An unusual or allergic reaction to dofetilide, other medications, foods, dyes, or preservatives Pregnant or trying to get pregnant Breast-feeding How should I use this medication? Take this medication by mouth with a glass of water. Follow the directions on the prescription label. Do not take with grapefruit juice. You can take it with or without food. If it upsets your stomach, take it with food. Take your medication at regular intervals. Do not take it more often than directed. Do not stop taking except on your care team's advice. A special MedGuide will be given to you by the pharmacist with each prescription and refill. Be sure to read this information carefully each time. Talk to your care team about the use of this medication in children. Special care may be needed. Overdosage: If you think you have taken too much of this medicine contact a poison control center or emergency room at once. NOTE: This medicine is only for you. Do not share this medicine with others. What if I miss a dose? If you miss a dose, skip it. Take your next dose at the normal time. Do not take extra or 2 doses at the same time to make up for the missed dose. What may interact with this medication? Do not take this medication with any of the  following: Benadryl (Diphenhydramine) Cimetidine Cisapride Dolutegravir Dronedarone Erdafitinib Hydrochlorothiazide Immodium Ketoconazole Megestrol Pimozide Prochlorperazine Thioridazine Trimethoprim Verapamil This medication may also interact with the following: Amiloride Cannabinoids Certain antibiotics like erythromycin or clarithromycin Certain antiviral medications for HIV or hepatitis Certain medications for depression, anxiety, or psychotic disorders Digoxin Diltiazem Grapefruit juice Metformin Nefazodone Other medications that prolong the QT interval (an abnormal heart rhythm) Quinine Triamterene Zafirlukast Ziprasidone This list may not describe all possible interactions. Give your health care provider a list of all the medicines, herbs, non-prescription drugs, or dietary supplements you use. Also tell them if you smoke, drink alcohol, or use illegal drugs. Some items may interact with your medicine. What should I watch for while using this medication? Your condition will be monitored carefully while you are receiving this medication. What side effects may I notice from receiving this medication? Side effects that you should report to your care team as soon as possible: Allergic reactions--skin rash, itching, hives, swelling of the face, lips, tongue, or throat Chest pain Heart rhythm changes--fast or irregular heartbeat, dizziness, feeling faint or lightheaded, chest pain, trouble breathing Side effects that usually do not require medical attention (report to your care team if they continue or are bothersome): Dizziness Headache Nausea Stomach pain Trouble sleeping This list may not describe all possible side effects. Call your doctor for medical advice about side effects. You may report side effects to FDA at 1-800-FDA-1088. Where should I keep my medication? Keep out of the reach of children. Store at room temperature between 15 and 30 degrees  C (59 and 86  degrees F). Throw away any unused medication after the expiration date. NOTE: This sheet is a summary. It may not cover all possible information. If you have questions about this medicine, talk to your doctor, pharmacist, or health care provider.  2024 Elsevier/Gold Standard (2021-05-10 00:00:00)

## 2023-10-02 ENCOUNTER — Encounter (HOSPITAL_COMMUNITY): Payer: Self-pay | Admitting: Cardiology

## 2023-10-02 ENCOUNTER — Other Ambulatory Visit (HOSPITAL_COMMUNITY): Payer: Self-pay

## 2023-10-02 DIAGNOSIS — I4819 Other persistent atrial fibrillation: Secondary | ICD-10-CM | POA: Diagnosis not present

## 2023-10-02 LAB — BASIC METABOLIC PANEL WITH GFR
Anion gap: 9 (ref 5–15)
BUN: 31 mg/dL — ABNORMAL HIGH (ref 8–23)
CO2: 27 mmol/L (ref 22–32)
Calcium: 8.9 mg/dL (ref 8.9–10.3)
Chloride: 102 mmol/L (ref 98–111)
Creatinine, Ser: 1.54 mg/dL — ABNORMAL HIGH (ref 0.44–1.00)
GFR, Estimated: 34 mL/min — ABNORMAL LOW (ref >60)
Glucose, Bld: 100 mg/dL — ABNORMAL HIGH (ref 70–99)
Potassium: 4.7 mmol/L (ref 3.5–5.1)
Sodium: 138 mmol/L (ref 135–145)

## 2023-10-02 LAB — MAGNESIUM: Magnesium: 2.3 mg/dL (ref 1.7–2.4)

## 2023-10-02 MED ORDER — SELEXIPAG 1600 MCG PO TABS
1600.0000 ug | ORAL_TABLET | Freq: Two times a day (BID) | ORAL | Status: DC
  Filled 2023-10-02: qty 1

## 2023-10-02 MED ORDER — DOFETILIDE 250 MCG PO CAPS
250.0000 ug | ORAL_CAPSULE | Freq: Two times a day (BID) | ORAL | 5 refills | Status: DC
  Filled 2023-10-02: qty 60, 30d supply, fill #0

## 2023-10-02 MED ORDER — MACITENTAN 10 MG PO TABS
10.0000 mg | ORAL_TABLET | Freq: Every day | ORAL | Status: DC
  Administered 2023-10-02: 10 mg via ORAL
  Filled 2023-10-02: qty 1

## 2023-10-02 NOTE — TOC Transition Note (Signed)
 Transition of Care [facility]) - Discharge Note   Patient Details  Name: Debra Saunders MRN: [id number] Date of Birth: March 16, 1946  [facility] CM/SW Contact:  [name], RN Phone Number: 10/02/2023, 12:37 PM   Clinical Narrative:     For dc today , she states her daughter will transport her home.         Patient Goals and CMS Choice            Discharge Placement                       Discharge Plan and Services Additional resources added to the After Visit Summary for                                       Social Drivers of Health (SDOH) Interventions SDOH Screenings   Food Insecurity: No Food Insecurity (09/29/2023)  Housing: Low Risk  (09/29/2023)  Transportation Needs: No Transportation Needs (09/29/2023)  Utilities: Not At Risk (09/29/2023)  Alcohol Screen: Low Risk  (07/30/2023)  Depression (PHQ2-9): Medium Risk (09/23/2023)  Financial Resource Strain: Low Risk  (07/30/2023)  Physical Activity: Inactive (07/30/2023)  Social Connections: Socially Isolated (09/29/2023)  Stress: No Stress Concern Present (07/30/2023)  Tobacco Use: Medium Risk (09/29/2023)  Health Literacy: Adequate Health Literacy (07/30/2023)     Readmission Risk Interventions     No data to display

## 2023-10-02 NOTE — Progress Notes (Signed)
 Pharmacy: Dofetilide (Tikosyn) - Follow Up Assessment and Electrolyte Replacement  Pharmacy consulted to assist in monitoring and replacing electrolytes in this 78 y.o. female admitted on 09/29/2023 undergoing dofetilide initiation.   Labs:    Component Value Date/Time   K 4.7 10/02/2023 0525   K 3.2 (L) 12/28/2013 1300   MG 2.3 10/02/2023 0525   MG 1.7 (L) 11/18/2013 0724     Plan: Potassium: K >/= 4: No additional supplementation needed  Magnesium: Mg > 2: No additional supplementation needed   At discharge, would continue oral potassium in am and in pm (her home regimen)  Thank you for allowing pharmacy to participate in this patient's care   Harland German, PharmD Clinical Pharmacist **Pharmacist phone directory can now be found on [email or web address] ([email or web address]).  Listed under [facility].

## 2023-10-02 NOTE — Discharge Summary (Signed)
 ELECTROPHYSIOLOGY PROCEDURE DISCHARGE SUMMARY    Patient ID: Debra Saunders,  MRN: [id number], DOB/AGE: 1946/05/08 78 y.o.  Admit date: 09/29/2023 Discharge date: 10/02/2023  Primary Care Physician: Skip Estimable, MD  Primary Cardiologist: Dr. Elwyn Lade Electrophysiologist: Dr.Klein  Primary Discharge Diagnosis:  1.  persistent atrial fibrillation       status post Tikosyn loading this admission      CHA2DS2Vasc is 9, on Eliquis  Secondary Discharge Diagnosis:  Pulmonary HTN Chronic PHpEF Orthostatic hypotension Hx of Endocarditis: in 1/23.  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  Group G strep bacteremia.  Septic emboli to lungs. Seen by EP, thought to be too high risk for device extraction. Unfortunately she will be at high risk for complications going forward.  She is now on lifetime suppression with cefadroxil.  5. HTN 6. Stroke history 7. COPD 8. OSA 9. 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  Tikosyn loading 2.  Direct current cardioversion on 10/01/23 by Dr Shirlee Latch which successfully restored SR.  There were no early apparent complications.   Brief HPI: Debra LUNDBLAD is a 78 y.o. female with a past medical history as noted above.  Tshe is followed out patient by EP in the outpatient setting  Risks, benefits, and alternatives to Tikosyn were reviewed with the patient who wished to proceed.    Hospital Course:  The patient was admitted and Tikosyn was initiated.  Renal function and electrolytes were followed during the hospitalization.  The patient's  QTc remained stable.  On 10/01/22 the patient underwent direct current cardioversion which restored sinus rhythm.  She was monitored until discharge on telemetry which demonstrated AV paced rhythm.  On the day of discharge, feels well, was examined by Dr Nelly Laurence who considered the patient stable for discharge to home.  Follow-up has been arranged with the AFib clinic in 1 week and the EP team in 4 weeks.   Tikosyn teaching was completed No new or additional electrolyte replacement for home Creat higher day of discharge, remains appropriately dosed advised patient to increase her oral hydration some the next couple days  She has some degree of chronic diarrhea, metamucil/bulking agent has been helping minimally, but is better. We discussed at length that she can not use imodium, she will give it to her 1 week visit with metamucil/fiber, if needed Lomotil would be an option  Physical Exam: Vitals:   10/01/23 2030 10/01/23 2313 10/02/23 0307 10/02/23 0904  BP: (!) 141/65  (!) 117/55 129/70  Pulse: 60 70 (!) 49 (!) 57  Resp: 18 18 16 18   Temp: 98.4 F (36.9 C)  98.4 F (36.9 C) (!) 97.4 F (36.3 C)  TempSrc: Oral  Oral Axillary  SpO2: 96%  99% 97%  Weight:      Height:         GEN- The patient is well appearing, alert and oriented x 3 today.   HEENT: normocephalic, atraumatic; sclera clear, conjunctiva pink; hearing intact; oropharynx clear; neck supple, no JVP Lymph-  no cervical lymphadenopathy Lungs- CTA b/l, normal work of breathing.  No wheezes, rales, rhonchi Heart- RRR, no murmurs, rubs or gallops, PMI not laterally displaced GI- soft, non-tender, non-distended Extremities- no clubbing, cyanosis, or edema MS- no significant deformity or atrophy Skin- warm and dry, no rash or lesion Psych- euthymic mood, full affect Neuro- strength and sensation are intact   Labs:   Lab Results  Component Value Date   WBC 4.4 09/23/2023   HGB 10.6 (L) 09/23/2023   HCT 32.5 (L) 09/23/2023    MCV 84.8 09/23/2023   PLT 166.0 09/23/2023    Recent Labs  Lab 09/29/23 1433 09/30/23 0425 10/02/23 0525  NA  --    < > 138  K  --    < > 4.7  CL  --    < > 102  CO2  --    < > 27  BUN  --    < > 31*  CREATININE  --    < > 1.54*  CALCIUM  --    < > 8.9  PROT 6.8  --   --   BILITOT 0.6  --   --   ALKPHOS 83  --   --   ALT 9  --   --   AST 13*  --   --   GLUCOSE  --    < > 100*   < > = values in this interval not displayed.     Discharge Medications:  Allergies as of 10/02/2023       Reactions   Aspirin Anaphylaxis, Hives   Pt states that she is unable to take because she had bleeding in her brain   Other Other (See Comments)   Pt states that she is unable to take blood thinners because she had bleeding in her brain; Blood Thinners-per doctor at [facility] (Xarelto)        Medication List     TAKE these medications    acetaminophen 325 MG tablet Commonly known as: TYLENOL Take 325-650 mg by mouth every 6 (six) hours as needed for mild pain.   albuterol 0.63 MG/3ML nebulizer solution Commonly known as: ACCUNEB Take 1 ampule by nebulization every 6 (six) hours as needed for wheezing.   apixaban 5 MG Tabs tablet Commonly known as: Eliquis Take 1 tablet (5 mg total) by mouth 2 (two) times daily. RESUME SUNDAY pm   atropine 1 % ophthalmic solution Place 1 drop into both eyes at bedtime.   Biofreeze 10 % Crea Generic drug: Menthol (Topical Analgesic) Apply 1 Application topically daily as needed (apply to back for pain).   cefadroxil 500 MG capsule Commonly known as: DURICEF Take 1 capsule (500 mg total) by mouth 2 (two) times daily.   dofetilide 250 MCG capsule Commonly known as: TIKOSYN Take 1 capsule (250 mcg total) by mouth 2 (two) times daily.   Farxiga 10 MG Tabs tablet Generic drug: dapagliflozin propanediol Take 1 tablet (10 mg total) by mouth daily before breakfast.   gabapentin 600 MG tablet Commonly known as: NEURONTIN Take 600 mg by  mouth at bedtime.   lidocaine 5 % Commonly known as: LIDODERM Place 2 patches onto the skin daily. Remove & Discard patch within 12 hours or as directed by MD   macitentan 10 MG tablet Commonly known as: OPSUMIT Take 1 tablet (10 mg total) by mouth daily.   meclizine 25 MG tablet Commonly known as: ANTIVERT Take 1 tablet (25 mg total) by mouth 3 (  three) times daily as needed for dizziness.   Melatonin 1 MG Caps Take 1 capsule (1 mg total) by mouth at bedtime.   midodrine 5 MG tablet Commonly known as: PROAMATINE Take 1 tablet (5 mg total) by mouth 3 (three) times daily with meals.   oxyCODONE 5 MG immediate release tablet Commonly known as: Oxy IR/ROXICODONE Take 1 tablet (5 mg total) by mouth every 4 (four) hours as needed for moderate pain, breakthrough pain or severe pain.   OXYGEN Inhale 4 L/min into the lungs continuous.   potassium chloride SA 20 MEQ tablet Commonly known as: KLOR-CON M Take every morning and 20mg  every evening.   prednisoLONE acetate 1 % ophthalmic suspension Commonly known as: PRED FORTE Place 1 drop into both eyes 2 (two) times daily.   rosuvastatin 10 MG tablet Commonly known as: CRESTOR Take 1 tablet (10 mg total) by mouth daily.   sildenafil 20 MG tablet Commonly known as: REVATIO Take 1 tablet (20 mg total) by mouth 3 (three) times daily. NEEDS FOLLOW UP APPOINTMENT FOR ANYMORE REFILLS   spironolactone 25 MG tablet Commonly known as: ALDACTONE Take 1 tablet by mouth daily.   torsemide 20 MG tablet Commonly known as: DEMADEX Take 3 tablets (60 mg total) by mouth daily.   Uptravi 1600 MCG Tabs Generic drug: Selexipag Take 1 tablet (1,600 mcg total) by mouth 2 (two) times daily.   Vitamin D (Ergocalciferol) 1.25 MG (50000 UNIT) Caps capsule Commonly known as: DRISDOL Take 1 capsule (50,000 Units total) by mouth every 7 (seven) days.        Disposition:  Discharge Instructions     Diet - low sodium heart healthy    Complete by: As directed    Increase activity slowly   Complete by: As directed         Duration of Discharge Encounter: 20 minutes, APP time.  Norma Fredrickson, PA-C 10/02/2023 12:29 PM

## 2023-10-02 NOTE — Progress Notes (Addendum)
 EKG and telemetry reviewed with Dr. Nelly Laurence QTc stable 6th dose tikosyn this morning Creat is up some from her baseline, though still acceptable > encouraged better PO hydration Anticipate discharge this afternoon   Francis Dowse, PA-C

## 2023-10-02 NOTE — Progress Notes (Signed)
   10/01/23 2313  BiPAP/CPAP/SIPAP  BiPAP/CPAP/SIPAP Pt Type Adult  BiPAP/CPAP/SIPAP Resmed  Mask Type Full face mask  Mask Size Small  EPAP 5 cmH2O  Flow Rate 4 lpm (4L O2 bleed in)  Patient Home Machine No  Patient Home Mask No  Patient Home Tubing No  Auto Titrate No  CPAP/SIPAP surface wiped down Yes  Device Plugged into RED Power Outlet Yes  BiPAP/CPAP /SiPAP Vitals  Pulse Rate 70  Resp 18  Bilateral Breath Sounds Clear;Diminished  MEWS Score/Color  MEWS Score 0  MEWS Score Color [place] Si

## 2023-10-04 LAB — CUP PACEART REMOTE DEVICE CHECK
Battery Remaining Longevity: 152 mo
Battery Voltage: 3.16 V
Brady Statistic AP VP Percent: 84.87 %
Brady Statistic AP VS Percent: 0.02 %
Brady Statistic AS VP Percent: 7.91 %
Brady Statistic AS VS Percent: 7.19 %
Brady Statistic RA Percent Paced: 84.99 %
Brady Statistic RV Percent Paced: 92.83 %
Date Time Interrogation Session: 20250411205806
Implantable Lead Connection Status: 753985
Implantable Lead Connection Status: 753985
Implantable Lead Implant Date: 20150720
Implantable Lead Implant Date: 20150720
Implantable Lead Location: 753859
Implantable Lead Location: 753860
Implantable Lead Model: 5076
Implantable Lead Model: 5076
Implantable Pulse Generator Implant Date: 20241009
Lead Channel Impedance Value: 228 Ohm
Lead Channel Impedance Value: 285 Ohm
Lead Channel Impedance Value: 304 Ohm
Lead Channel Impedance Value: 342 Ohm
Lead Channel Pacing Threshold Amplitude: 0.75 V
Lead Channel Pacing Threshold Pulse Width: 0.4 ms
Lead Channel Sensing Intrinsic Amplitude: 0.75 mV
Lead Channel Sensing Intrinsic Amplitude: 0.75 mV
Lead Channel Sensing Intrinsic Amplitude: 6.625 mV
Lead Channel Sensing Intrinsic Amplitude: 6.625 mV
Lead Channel Setting Pacing Amplitude: 2 V
Lead Channel Setting Pacing Amplitude: 2 V
Lead Channel Setting Pacing Pulse Width: 0.4 ms
Lead Channel Setting Sensing Sensitivity: 0.9 mV
Zone Setting Status: 755011
Zone Setting Status: 755011

## 2023-10-05 ENCOUNTER — Telehealth: Payer: Self-pay | Admitting: *Deleted

## 2023-10-05 NOTE — Transitions of Care (Post Inpatient/ED Visit) (Signed)
   10/05/2023  Name: Debra Saunders MRN: [id number] DOB: Feb 20, 1946  Today's TOC FU Call Status: Today's TOC FU Call Status:: Unsuccessful Call (1st Attempt) Unsuccessful Call (1st Attempt) Date: 10/05/23  Attempted to reach the patient regarding the most recent Inpatient/ED visit.  Follow Up Plan: Additional outreach attempts will be made to reach the patient to complete the Transitions of Care (Post Inpatient/ED visit) call.   Una Ganser BSN RN [facility] Health Care Management Coordinator [email or web address] Direct Dial: [phone]  Fax: [phone] Website: [email or web address]

## 2023-10-06 ENCOUNTER — Other Ambulatory Visit (HOSPITAL_COMMUNITY): Payer: Self-pay

## 2023-10-06 ENCOUNTER — Ambulatory Visit (HOSPITAL_COMMUNITY): Admitting: Physician Assistant

## 2023-10-06 ENCOUNTER — Telehealth: Payer: Self-pay | Admitting: *Deleted

## 2023-10-06 NOTE — Transitions of Care (Post Inpatient/ED Visit) (Signed)
 10/06/2023  Name: Debra Saunders MRN: [id number] DOB: 1946/03/22  Today's TOC FU Call Status: Today's TOC FU Call Status:: Successful TOC FU Call Completed TOC FU Call Complete Date: 10/06/23 Patient's Name and Date of Birth confirmed.  Transition Care Management Follow-up Telephone Call Date of Discharge: 10/02/23 Discharge Facility: [facility]) Type of Discharge: Inpatient Admission Primary Inpatient Discharge Diagnosis:: Persistent atrial Fib How have you been since you were released from the hospital?: Better (I had fluttering feeling on sun and now just a little fluttering) Any questions or concerns?: Yes Patient Questions/Concerns:: Is tikosyn compatible with meclizine Patient Questions/Concerns Addressed: Other: (low sodium heart healthy)  Items Reviewed: Did you receive and understand the discharge instructions provided?: Yes Medications obtained,verified, and reconciled?: Yes (Medications Reviewed) Any new allergies since your discharge?: No Dietary orders reviewed?: Yes Type of Diet Ordered:: low sodium heart healthy Do you have support at home?: Yes People in Home [RPT]: alone Name of Support/Comfort Primary Source: [name]  Medications Reviewed Today: Medications Reviewed Today     Reviewed by Luella Cook, RN (Case Manager) on 10/06/23 at 1059  Med List Status: <None>   Medication Order Taking? Sig Documenting Provider Last Dose Status Informant  acetaminophen (TYLENOL) 325 MG tablet [id number] Yes Take 325-650 mg by mouth every 6 (six) hours as needed for mild pain. [provider] Taking Active Self           Med Note Jory Ee, TYELISHA L   Tue Sep 29, 2023  2:49 PM)    albuterol (ACCUNEB) 0.63 MG/3ML nebulizer solution [id number] Yes Take 1 ampule by nebulization every 6 (six) hours as needed for wheezing. [provider] Taking Active Self  apixaban (ELIQUIS) 5 MG TABS tablet [id number] Yes Take 1 tablet (5 mg total) by mouth 2 (two)  times daily. RESUME SUNDAY pm Duke Salvia, MD Taking Active Self  atropine 1 % ophthalmic solution [id number] Yes Place 1 drop into both eyes at bedtime. [provider] Taking Active Self  cefadroxil (DURICEF) 500 MG capsule [id number] Yes Take 1 capsule (500 mg total) by mouth 2 (two) times daily. Lynn Ito, MD Taking Active Self  dapagliflozin propanediol (FARXIGA) 10 MG TABS tablet [id number]  Take 1 tablet (10 mg total) by mouth daily before breakfast. Enedina Finner, MD  Active            Med Note Hollice Espy, EMMA U   Tue Sep 29, 2023  2:15 PM) Instructed to hold for possible DCCV  diclofenac Sodium (VOLTAREN) 1 % topical gel 2 g [id number]   Skip Estimable, MD  Active   dofetilide (TIKOSYN) 250 MCG capsule [id number] Yes Take 1 capsule (250 mcg total) by mouth 2 (two) times daily. Sheilah Pigeon, PA-C Taking Active   gabapentin (NEURONTIN) 600 MG tablet [id number] Yes Take 600 mg by mouth at bedtime. [provider] Taking Active Self  lidocaine (LIDODERM) 5 % [id number] Yes Place 2 patches onto the skin daily. Remove & Discard patch within 12 hours or as directed by MD Swayze, Ava, DO Taking Active Self  macitentan (OPSUMIT) 10 MG tablet [id number]  Take 1 tablet (10 mg total) by mouth daily. Laurey Morale, MD  Active Self  meclizine (ANTIVERT) 25 MG tablet [id number] Yes Take 1 tablet (25 mg total) by mouth 3 (three) times daily as needed for dizziness. Laurey Morale, MD Taking Active Self  Melatonin 1 MG CAPS [id number] Yes Take 1 capsule (1 mg total) by mouth at bedtime.  Patient taking differently: Take 5 mg by mouth at bedtime.   Bair, Elveria Rising, MD Taking Active   Menthol, Topical Analgesic, (BIOFREEZE) 10 % CREA [id number] Yes Apply 1 Application topically daily as needed (apply to back for pain). [provider] Taking Active   midodrine (PROAMATINE) 5 MG tablet [id number] Yes Take 1 tablet (5 mg total) by mouth 3 (three) times daily with meals. Romie Minus, MD Taking Active   oxyCODONE (OXY IR/ROXICODONE) 5 MG immediate release tablet [id number]  Take 1 tablet (5 mg total) by mouth every 4 (four) hours as needed for moderate pain, breakthrough pain or severe pain.  Patient not taking: Reported on 09/29/2023   Fran Lowes, DO  Active Self  OXYGEN [id number] Yes Inhale 4 L/min into the lungs continuous. [provider] Taking Active   potassium chloride SA (KLOR-CON M) 20 MEQ tablet [id number] Yes Take every morning and 20mg  every evening. Enedina Finner, MD Taking Active   prednisoLONE acetate (PRED FORTE) 1 % ophthalmic suspension [id number] Yes Place 1 drop into both eyes 2 (two) times daily. [provider] Taking Active   rosuvastatin (CRESTOR) 10 MG tablet [id number] Yes Take 1 tablet (10 mg total) by mouth daily. Laurey Morale, MD Taking Active   Selexipag (UPTRAVI) 1600 MCG TABS [id number] Yes Take 1 tablet (1,600 mcg total) by mouth 2 (two) times daily. Laurey Morale, MD Taking Active Self  sildenafil (REVATIO) 20 MG tablet [id number] Yes Take 1 tablet (20 mg total) by mouth 3 (three) times daily. NEEDS FOLLOW UP APPOINTMENT FOR ANYMORE REFILLS Laurey Morale, MD Taking Active   spironolactone (ALDACTONE) 25 MG tablet [id number] Yes Take 1 tablet by mouth daily. [provider] Taking Active Self  torsemide (DEMADEX) 20 MG tablet [id number] Yes Take 3 tablets (60 mg total) by mouth daily. Laurey Morale, MD Taking Active   Vitamin D, Ergocalciferol, (DRISDOL) 1.25 MG (50000 UNIT) CAPS capsule [id number] Yes Take 1 capsule (50,000 Units total) by mouth every 7 (seven) days. Skip Estimable, MD Taking Active             Home Care and Equipment/Supplies: Were Home Health Services Ordered?: NA Any new equipment or medical supplies ordered?: NA  Functional Questionnaire: Do you need assistance with bathing/showering or dressing?: No Do you need assistance with meal preparation?: No Do you need  assistance with eating?: No Do you have difficulty maintaining continence: No Do you need assistance with getting out of bed/getting out of a chair/moving?: No Do you have difficulty managing or taking your medications?: No  Follow up appointments reviewed: PCP Follow-up appointment confirmed?: NA Specialist Hospital Follow-up appointment confirmed?: Yes Date of Specialist follow-up appointment?: 10/08/23 Follow-Up Specialty Provider:: [id number] Afib clinic/ [id number] [place] [facility] Do you need transportation to your follow-up appointment?: No Do you understand care options if your condition(s) worsen?: Yes-patient verbalized understanding  SDOH Interventions Today    Flowsheet Row Most Recent Value  SDOH Interventions   Food Insecurity Interventions Intervention Not Indicated  Housing Interventions Intervention Not Indicated  Transportation Interventions Intervention Not Indicated  Utilities Interventions Intervention Not Indicated      Patient declined [facility] services Per [facility] Tikosyn is compatible with Meclizine  Gean Maidens BSN RN [facility] Health Care Management Coordinator [email or web address] Direct Dial: [phone]  Fax: [phone] Website: [email or web address]

## 2023-10-08 ENCOUNTER — Ambulatory Visit (HOSPITAL_COMMUNITY)
Admit: 2023-10-08 | Discharge: 2023-10-08 | Disposition: A | Discharge status: Home / Self Care | Attending: Physician Assistant | Admitting: Physician Assistant

## 2023-10-08 ENCOUNTER — Encounter (HOSPITAL_COMMUNITY): Payer: Self-pay | Admitting: Physician Assistant

## 2023-10-08 VITALS — BP 146/86 | HR 66 | Ht 60.0 in | Wt 192.2 lb

## 2023-10-08 DIAGNOSIS — Z7901 Long term (current) use of anticoagulants: Secondary | ICD-10-CM | POA: Diagnosis not present

## 2023-10-08 DIAGNOSIS — G4733 Obstructive sleep apnea (adult) (pediatric): Secondary | ICD-10-CM | POA: Insufficient documentation

## 2023-10-08 DIAGNOSIS — Z79899 Other long term (current) drug therapy: Secondary | ICD-10-CM | POA: Diagnosis not present

## 2023-10-08 DIAGNOSIS — Z5181 Encounter for therapeutic drug level monitoring: Secondary | ICD-10-CM | POA: Insufficient documentation

## 2023-10-08 DIAGNOSIS — I130 Hypertensive heart and chronic kidney disease with heart failure and stage 1 through stage 4 chronic kidney disease, or unspecified chronic kidney disease: Secondary | ICD-10-CM | POA: Diagnosis not present

## 2023-10-08 DIAGNOSIS — Z950 Presence of cardiac pacemaker: Secondary | ICD-10-CM | POA: Diagnosis not present

## 2023-10-08 DIAGNOSIS — I495 Sick sinus syndrome: Secondary | ICD-10-CM | POA: Insufficient documentation

## 2023-10-08 DIAGNOSIS — I4819 Other persistent atrial fibrillation: Secondary | ICD-10-CM | POA: Insufficient documentation

## 2023-10-08 DIAGNOSIS — D6869 Other thrombophilia: Secondary | ICD-10-CM | POA: Insufficient documentation

## 2023-10-08 DIAGNOSIS — Z8616 Personal history of COVID-19: Secondary | ICD-10-CM | POA: Diagnosis not present

## 2023-10-08 DIAGNOSIS — Z7984 Long term (current) use of oral hypoglycemic drugs: Secondary | ICD-10-CM | POA: Diagnosis not present

## 2023-10-08 DIAGNOSIS — Z792 Long term (current) use of antibiotics: Secondary | ICD-10-CM | POA: Insufficient documentation

## 2023-10-08 DIAGNOSIS — J8410 Pulmonary fibrosis, unspecified: Secondary | ICD-10-CM | POA: Insufficient documentation

## 2023-10-08 LAB — BASIC METABOLIC PANEL WITH GFR
Anion gap: 9 (ref 5–15)
BUN: 19 mg/dL (ref 8–23)
CO2: 27 mmol/L (ref 22–32)
Calcium: 8.9 mg/dL (ref 8.9–10.3)
Chloride: 105 mmol/L (ref 98–111)
Creatinine, Ser: 0.94 mg/dL (ref 0.44–1.00)
GFR, Estimated: >60 mL/min (ref >60)
Glucose, Bld: 91 mg/dL (ref 70–99)
Potassium: 3.5 mmol/L (ref 3.5–5.1)
Sodium: 141 mmol/L (ref 135–145)

## 2023-10-08 LAB — MAGNESIUM: Magnesium: 2.1 mg/dL (ref 1.7–2.4)

## 2023-10-08 MED ORDER — DOFETILIDE 250 MCG PO CAPS: 250.0000 ug | ORAL_CAPSULE | Freq: Two times a day (BID) | ORAL | 4 refills | Status: DC

## 2023-10-08 NOTE — Patient Instructions (Addendum)
 Will request for Debra Saunders appointment to move up

## 2023-10-08 NOTE — Progress Notes (Signed)
 Primary Care Physician: Skip Estimable, MD Primary Cardiologist: [name], MD Electrophysiologist: Sherryl Manges, MD  Referring Physician: Sherie Don NP   Debra Saunders is a 78 y.o. 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 Previously on amiodarone but this was discontinued due to pulmonary fibrosis on CT. Referred to the AF clinic for dofetilide admission. Patient is on Eliquis for stroke prevention. Patient is s/p dofetilide loading 4/8-4/11/25 with DCCV on 10/01/23.  Patient returns for follow up for atrial fibrillation and dofetilide monitoring. She remains in SR today. She did have one episode of tachypalpitations on Monday which lasted ~ 30 minutes. Her primary concern is about her chronic diarrhea. She can not take Imodium with dofetilide. She plans to reach out to her GI specialist for recommendations.   Today, she  denies symptoms of palpitations, chest pain, orthopnea, PND, lower extremity edema, dizziness, presyncope, syncope, bleeding, or neurologic sequela. The patient is tolerating medications without difficulties and is otherwise without complaint today.    Atrial Fibrillation Risk Factors:  she does have symptoms or diagnosis of sleep apnea. she does not have a history of rheumatic fever.   Atrial Fibrillation Management history:  Previous antiarrhythmic drugs: amiodarone, dofetilide Previous cardioversions: 10/12/17, 07/04/21, 03/27/23, 10/01/23 Previous ablations: none Anticoagulation history: Eliquis  ROS- All systems are reviewed and negative except as per the HPI above.  Past Medical History:  Diagnosis Date   Acute hypokalemia 07/18/2023   Arthritis    Atrial fibrillation, persistent (HCC)    a. s/p TEE-guided DCCV on  08/20/2017; b. 06/2021 Recurrent Afib-->amio/TEE/DCCV.   CAD (coronary artery disease)    a. 01/2012 Cath: nonobs dzs; b. 04/2014 MV: No ischemia; c. 09/2017 Cath: Minimal nonobs RCA dzs, otw nl cors. 09/2023: Tikosyn loading   Cardiorenal syndrome    a. 09/2017 Creat rose to 2.7 w/ diuresis-->HD x 2.   Chronic back pain    Chronic combined systolic (congestive) and diastolic (congestive) heart failure (HCC)    a. Dx 2005 @ [facility];  b. 02/2014 Echo: EF 55-60%; c. 07/2017: EF 30-35%; d. 09/2017 Echo: EF 40-45%; e. 02/2018 Echo: EF 55-60%; f. 08/2019 Echo: EF 60-65%; g. 08/2020 RHC: RA 3, RV 30/1, PA 33/11 (20), PCWP 6; f. 08/2021 Echo: EF 60-65%, no rwma, GrIDD, nl RV fxn, RVSP 40.85mmHg, mod dil LA, mod TR.   Chronic respiratory failure (HCC)    a. on home O2   Gastritis    History of COVID-19    History of endocarditis 08/22/2021   History of intracranial hemorrhage    a. 6/14 described by neurosurgery as "small frontal posttraumatic SAH " - pt was on xarelto @ the time.   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 Chronic LLE edema.   NICM (nonischemic cardiomyopathy) (HCC)    a. 09/2017 Echo: EF 40-45%; b. 09/2017 Cath: minor, insignificant CAD; c. 02/2018 Echo: EF 55-60%, Gr1 DD; d. 08/2019 Echo: EF 60-65%, Gr2 DD.   Obesity    PAH (pulmonary artery hypertension) (HCC)    a. 09/2017 Echo: PASP ; b. 09/2017 RHC: RA 23, RV 84/13, PA 84/29, PCWP  20, CO 3.89, CI 1.89. LVEDP 18; c. 08/2020 RHC: RA 3, RV 30/1, PA 33/11 (20), PCWP 6. CO/CI 6.18/3.36. PVR 2.3 WU.   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 06/2021. Veg on PPM lead. Not candidate for extraction-->s/p 6 wks abx.   Sepsis with metabolic encephalopathy (HCC)    Sleep apnea    Streptococcal bacteremia    Stroke [facility]) 2014   Subarachnoid hemorrhage (HCC) 01/19/2013    Symptomatic bradycardia    a. s/p BSX PPM.   Thyroid nodule    Type II diabetes mellitus (HCC)    Urinary incontinence     Current Outpatient Medications  Medication Sig Dispense Refill   acetaminophen (TYLENOL) 325 MG tablet Take 325-650 mg by mouth every 6 (six) hours as needed for mild pain.     albuterol (ACCUNEB) 0.63 MG/3ML nebulizer solution Take 1 ampule by nebulization every 6 (six) hours as needed for wheezing.     apixaban (ELIQUIS) 5 MG TABS tablet Take 1 tablet (5 mg total) by mouth 2 (two) times daily. RESUME SUNDAY pm 180 tablet 3   atropine 1 % ophthalmic solution Place 1 drop into both eyes at bedtime.     cefadroxil (DURICEF) 500 MG capsule Take 1 capsule (500 mg total) by mouth 2 (two) times daily. 60 capsule 12   dapagliflozin propanediol (FARXIGA) 10 MG TABS tablet Take 1 tablet (10 mg total) by mouth daily before breakfast. 90 tablet 1   gabapentin (NEURONTIN) 600 MG tablet Take 600 mg by mouth at bedtime.     lidocaine (LIDODERM) 5 % Place 2 patches onto the skin daily. Remove & Discard patch within 12 hours or as directed by MD 30 patch 0   macitentan (OPSUMIT) 10 MG tablet Take 1 tablet (10 mg total) by mouth daily. 30 tablet 11   meclizine (ANTIVERT) 25 MG tablet Take 1 tablet (25 mg total) by mouth 3 (three) times daily as needed for dizziness. 30 tablet 1   Melatonin 1 MG CAPS Take 1 capsule (1 mg total) by mouth at bedtime. (Patient taking differently: Take 5 mg by mouth at bedtime.) 30 capsule 0   Menthol, Topical Analgesic, (BIOFREEZE) 10 % CREA Apply 1 Application topically daily as needed (apply to back for pain).     midodrine (PROAMATINE) 5 MG tablet Take 1 tablet (5 mg total) by mouth 3 (three) times daily with meals. 90 tablet 5   oxyCODONE (OXY IR/ROXICODONE) 5 MG immediate release tablet Take 1 tablet (5 mg total) by mouth every 4 (four) hours as needed for moderate pain, breakthrough pain or severe pain. 12 tablet 0   OXYGEN Inhale 4 L/min into the lungs  continuous.     potassium chloride SA (KLOR-CON M) 20 MEQ tablet Take every morning and 20mg  every evening. 60 tablet 1   prednisoLONE acetate (PRED FORTE) 1 % ophthalmic suspension Place 1 drop into both eyes 2 (two) times daily.     rosuvastatin (CRESTOR) 10 MG tablet Take 1 tablet (10 mg total) by mouth daily. 90 tablet 3   Selexipag (UPTRAVI) 1600 MCG TABS Take 1 tablet (1,600 mcg total) by mouth 2 (two) times daily. 60 tablet 11   sildenafil (REVATIO) 20 MG tablet Take 1 tablet (20 mg total) by mouth 3 (three) times daily. NEEDS FOLLOW UP APPOINTMENT FOR ANYMORE REFILLS 90 tablet 0   spironolactone (ALDACTONE) 25 MG tablet Take 1 tablet by mouth daily.  torsemide (DEMADEX) 20 MG tablet Take 3 tablets (60 mg total) by mouth daily. 200 tablet 3   Vitamin D, Ergocalciferol, (DRISDOL) 1.25 MG (50000 UNIT) CAPS capsule Take 1 capsule (50,000 Units total) by mouth every 7 (seven) days. 9 capsule 0   dofetilide (TIKOSYN) 250 MCG capsule Take 1 capsule (250 mcg total) by mouth 2 (two) times daily. 60 capsule 4   Current Facility-Administered Medications  Medication Dose Route Frequency Provider Last Rate Last Admin   diclofenac Sodium (VOLTAREN) 1 % topical gel 2 g  2 g Topical QID         Physical Exam: BP (!) 146/86   Pulse 66   Ht 5' (1.524 m)   Wt 87.2 kg   BMI 37.54 kg/m   GEN: Well nourished, well developed in no acute distress CARDIAC: Regular rate and rhythm, no murmurs, rubs, gallops RESPIRATORY:  Clear to auscultation without rales, wheezing or rhonchi, on nasal canula O2 ABDOMEN: Soft, non-tender, non-distended EXTREMITIES:  trace bilateral lower extremity edema, No deformity    Wt Readings from Last 3 Encounters:  10/08/23 87.2 kg  09/29/23 87.7 kg  09/29/23 87.5 kg     EKG today demonstrates  AV dual paced rhythm Vent. rate 66 BPM PR interval 178 ms QRS duration 184 ms QT/QTcB 496/519 ms   Echo 08/23/21 demonstrated   1. Left ventricular ejection  fraction, by estimation, is 60 to 65%. The  left ventricle has normal function. The left ventricle has no regional  wall motion abnormalities. There is mild left ventricular hypertrophy.  Left ventricular diastolic parameters are consistent with Grade I diastolic dysfunction (impaired relaxation).   2. Right ventricular systolic function is normal. The right ventricular  size is normal. There is mildly elevated pulmonary artery systolic  pressure. The estimated right ventricular systolic pressure is 40.3 mmHg.   3. Left atrial size was moderately dilated.   4. The mitral valve is normal in structure. No evidence of mitral valve  regurgitation. No evidence of mitral stenosis.   5. Tricuspid valve regurgitation is moderate.   6. The aortic valve is normal in structure. Aortic valve regurgitation is  not visualized. Aortic valve sclerosis/calcification is present, without  any evidence of aortic stenosis.   7. The inferior vena cava is normal in size with greater than 50%  respiratory variability, suggesting right atrial pressure of 3 mmHg.   8. RV pacer wire difficult to visualize, grossly no vegetation noted.    CHA2DS2-VASc Score = 9  The patient's score is based upon: CHF History: 1 HTN History: 1 Diabetes History: 1 Stroke History: 2 Vascular Disease History: 1 Age Score: 2 Gender Score: 1       ASSESSMENT AND PLAN: Persistent Atrial Fibrillation (ICD10:  I48.19) The patient's CHA2DS2-VASc score is 9, indicating a 12.2% annual risk of stroke.   Amiodarone discontinued due to pulmonary fibrosis S/p dofetilide loading 4/8-4/11/25 with DCCV on 10/01/23 Patient appears to be maintaining SR Continue Eliquis 5 mg BID Continue dofetilide 250 mcg BID  Secondary Hypercoagulable State (ICD10:  D68.69) The patient is at significant risk for stroke/thromboembolism based upon her CHA2DS2-VASc Score of 9.  Continue Apixaban (Eliquis). No bleeding issues.   High Risk Medication  Monitoring (ICD 10: Z79.899) QT interval on ECG acceptable for dofetilide monitoring. Check bmet/mag today. QTc 453 ms when corrected for V pacing.    Chronic HFpEF EF 60-65% GDMT per primary cardiology team Fluid status appears stable today  OSA  Encouraged nightly CPAP  HTN  Stable on current regimen  Sinus node dysfunction S/p PPM, followed by Dr Rodolfo Clan On chronic antibiotics for lead vegetation.    Follow up with Suzann Riddle in one month.       Myrtha Ates PA-C [facility] [address] [place], [place] [address] [phone]

## 2023-10-09 MED ORDER — POTASSIUM CHLORIDE CRYS ER 20 MEQ PO TBCR: EXTENDED_RELEASE_TABLET | ORAL | Status: DC

## 2023-10-09 NOTE — Addendum Note (Signed)
 Encounter addended by: Ernestina Headland, CMA on: 10/09/2023 11:41 AM  Actions taken: Order list changed

## 2023-10-15 ENCOUNTER — Encounter: Payer: Self-pay | Admitting: Internal Medicine

## 2023-10-19 ENCOUNTER — Inpatient Hospital Stay: Attending: Internal Medicine

## 2023-10-19 ENCOUNTER — Inpatient Hospital Stay

## 2023-10-19 ENCOUNTER — Encounter: Payer: Self-pay | Admitting: Internal Medicine

## 2023-10-19 ENCOUNTER — Inpatient Hospital Stay (HOSPITAL_BASED_OUTPATIENT_CLINIC_OR_DEPARTMENT_OTHER): Admitting: Internal Medicine

## 2023-10-19 VITALS — BP 113/58 | HR 52

## 2023-10-19 VITALS — BP 142/68 | HR 51 | Temp 97.1°F | Resp 20 | Ht 60.0 in | Wt 185.0 lb

## 2023-10-19 DIAGNOSIS — Z7901 Long term (current) use of anticoagulants: Secondary | ICD-10-CM | POA: Insufficient documentation

## 2023-10-19 DIAGNOSIS — N183 Chronic kidney disease, stage 3 unspecified: Secondary | ICD-10-CM | POA: Insufficient documentation

## 2023-10-19 DIAGNOSIS — Z950 Presence of cardiac pacemaker: Secondary | ICD-10-CM | POA: Diagnosis not present

## 2023-10-19 DIAGNOSIS — D649 Anemia, unspecified: Secondary | ICD-10-CM

## 2023-10-19 DIAGNOSIS — R197 Diarrhea, unspecified: Secondary | ICD-10-CM | POA: Diagnosis not present

## 2023-10-19 DIAGNOSIS — I4891 Unspecified atrial fibrillation: Secondary | ICD-10-CM | POA: Insufficient documentation

## 2023-10-19 DIAGNOSIS — R0602 Shortness of breath: Secondary | ICD-10-CM | POA: Insufficient documentation

## 2023-10-19 DIAGNOSIS — R5382 Chronic fatigue, unspecified: Secondary | ICD-10-CM | POA: Insufficient documentation

## 2023-10-19 DIAGNOSIS — E611 Iron deficiency: Secondary | ICD-10-CM | POA: Diagnosis not present

## 2023-10-19 DIAGNOSIS — K909 Intestinal malabsorption, unspecified: Secondary | ICD-10-CM | POA: Insufficient documentation

## 2023-10-19 DIAGNOSIS — E876 Hypokalemia: Secondary | ICD-10-CM | POA: Diagnosis not present

## 2023-10-19 DIAGNOSIS — Z87891 Personal history of nicotine dependence: Secondary | ICD-10-CM | POA: Diagnosis not present

## 2023-10-19 DIAGNOSIS — D631 Anemia in chronic kidney disease: Secondary | ICD-10-CM | POA: Insufficient documentation

## 2023-10-19 DIAGNOSIS — K921 Melena: Secondary | ICD-10-CM | POA: Diagnosis not present

## 2023-10-19 DIAGNOSIS — E538 Deficiency of other specified B group vitamins: Secondary | ICD-10-CM

## 2023-10-19 DIAGNOSIS — Z86718 Personal history of other venous thrombosis and embolism: Secondary | ICD-10-CM | POA: Insufficient documentation

## 2023-10-19 LAB — CBC WITH DIFFERENTIAL (CANCER CENTER ONLY)
Abs Immature Granulocytes: 0.01 10*3/uL (ref 0.00–0.07)
Basophils Absolute: 0 10*3/uL (ref 0.0–0.1)
Basophils Relative: 1 %
Eosinophils Absolute: 0.2 10*3/uL (ref 0.0–0.5)
Eosinophils Relative: 4 %
HCT: 35.3 % — ABNORMAL LOW (ref 36.0–46.0)
Hemoglobin: 10.8 g/dL — ABNORMAL LOW (ref 12.0–15.0)
Immature Granulocytes: 0 %
Lymphocytes Relative: 23 %
Lymphs Abs: 0.9 10*3/uL (ref 0.7–4.0)
MCH: 27.3 pg (ref 26.0–34.0)
MCHC: 30.6 g/dL (ref 30.0–36.0)
MCV: 89.1 fL (ref 80.0–100.0)
Monocytes Absolute: 0.3 10*3/uL (ref 0.1–1.0)
Monocytes Relative: 7 %
Neutro Abs: 2.7 10*3/uL (ref 1.7–7.7)
Neutrophils Relative %: 65 %
Platelet Count: 181 10*3/uL (ref 150–400)
RBC: 3.96 MIL/uL (ref 3.87–5.11)
RDW: 14 % (ref 11.5–15.5)
WBC Count: 4.1 10*3/uL (ref 4.0–10.5)
nRBC: 0 % (ref 0.0–0.2)

## 2023-10-19 LAB — BASIC METABOLIC PANEL WITH GFR
Anion gap: 10 (ref 5–15)
BUN: 25 mg/dL — ABNORMAL HIGH (ref 8–23)
CO2: 26 mmol/L (ref 22–32)
Calcium: 8.9 mg/dL (ref 8.9–10.3)
Chloride: 103 mmol/L (ref 98–111)
Creatinine, Ser: 1.22 mg/dL — ABNORMAL HIGH (ref 0.44–1.00)
GFR, Estimated: 45 mL/min — ABNORMAL LOW (ref >60)
Glucose, Bld: 107 mg/dL — ABNORMAL HIGH (ref 70–99)
Potassium: 3.9 mmol/L (ref 3.5–5.1)
Sodium: 139 mmol/L (ref 135–145)

## 2023-10-19 LAB — IRON AND TIBC
Iron: 83 ug/dL (ref 28–170)
Saturation Ratios: 24 % (ref 10.4–31.8)
TIBC: 353 ug/dL (ref 250–450)
UIBC: 270 ug/dL

## 2023-10-19 LAB — VITAMIN B12: Vitamin B-12: 169 pg/mL — ABNORMAL LOW (ref 180–914)

## 2023-10-19 LAB — FERRITIN: Ferritin: 570 ng/mL — ABNORMAL HIGH (ref 11–307)

## 2023-10-19 MED ORDER — IRON SUCROSE 20 MG/ML IV SOLN
200.0000 mg | Freq: Once | INTRAVENOUS | Status: AC
  Administered 2023-10-19: 200 mg via INTRAVENOUS
  Filled 2023-10-19: qty 10

## 2023-10-19 MED ORDER — SODIUM CHLORIDE 0.9% FLUSH
10.0000 mL | Freq: Once | INTRAVENOUS | Status: DC | PRN
  Filled 2023-10-19: qty 10

## 2023-10-19 MED ORDER — CYANOCOBALAMIN 1000 MCG/ML IJ SOLN
1000.0000 ug | Freq: Once | INTRAMUSCULAR | Status: AC
  Administered 2023-10-19: 1000 ug via INTRAMUSCULAR
  Filled 2023-10-19: qty 1

## 2023-10-19 NOTE — Progress Notes (Signed)
  [facility] CONSULT NOTE  Patient Care Team: Bair, Kalpana, MD as PCP - General (Family Medicine) Wenona Hamilton, MD as PCP - Cardiology (Cardiology) Verona Goodwill, MD as PCP - Electrophysiology (Cardiology) Secundino Dach, MD as Referring Physician (Allergy) Marine Sia, MD as Referring Physician (Internal Medicine) Gwyn Leos, MD as Consulting Physician (Hematology and Oncology) Green, Davina E, RN as [facility] Care Management Anemia CHIEF COMPLAINTS/PURPOSE OF CONSULTATION: Anemia   HEMATOLOGY HISTORY  # CHORNIC IRON  DEFICIENCY ANEMIA [BL 10-11]-Jan today 21 PCP Hb-8.8; [Iron  sat-5%; ferritin 11] EGD-; colonoscopy MARCH 2020 -polyp [[facility]-hold further work-up secondary to cardiac issues]; remote-EGD-?;  February 2021-LDH/haptoglobin normal; kappa lambda light chain ratio/M panel normal;  on Venofer ; November 2020-CT scan question early cirrhosis/no splenomegaly.  No acute process; MARCH 2021- STOOL OCCULT x3- NEGATIVE.   #B12 deficiency-IM every 3 to 4 months  #2014 -question blood transfusion  [patient admitted for stroke]; CHF chronic -[ 2004 on 4 lits; Dr.Arida/Klein]; CT scan chest 2021 -no adenopathy; fibrotic changes; walker; CKD stage III-   #Incidental right axillary mass-s/p US  biopsy [PCP] -negative for malignancy.  HISTORY OF PRESENTING ILLNESS: Patient in wheelchair on [place] O2.  Alone.  Debra Saunders 78 y.o.  female multiple comorbidities including A-fib on anticoagulation, COPD/chronic congestive failure on oxygen  and anemia/likely secondary to ?CKD stage III -as needed IV iron  is here for follow-up.   In the interim patient had a cardioversion.  She continues to be on Eliquis .  Noted to have intermittent blood in stools.  Chronic fatigue chronic shortness of breath not any worse.  Patient denies any nausea vomiting.  Patient is noncompliant with her oral iron  as she complains of abdominal discomfort.  Review of  Systems  Constitutional:  Positive for malaise/fatigue. Negative for chills, diaphoresis, fever and weight loss.  HENT:  Negative for nosebleeds and sore throat.   Eyes:  Negative for double vision.  Respiratory:  Positive for shortness of breath. Negative for cough, hemoptysis, sputum production and wheezing.   Cardiovascular:  Negative for chest pain, palpitations, orthopnea and leg swelling.  Gastrointestinal:  Negative for abdominal pain, blood in stool, constipation, heartburn, melena, nausea and vomiting.  Genitourinary:  Negative for dysuria, frequency and urgency.  Musculoskeletal:  Negative for back pain and joint pain.  Skin: Negative.  Negative for itching and rash.  Neurological:  Negative for dizziness, tingling, focal weakness, weakness and headaches.  Endo/Heme/Allergies:  Does not bruise/bleed easily.  Psychiatric/Behavioral:  Negative for depression. The patient is not nervous/anxious and does not have insomnia.     MEDICAL HISTORY:  Past Medical History:  Diagnosis Date   Acute hypokalemia 07/18/2023   Arthritis    Atrial fibrillation, persistent (HCC)    a. s/p TEE-guided DCCV on 08/20/2017; b. 06/2021 Recurrent Afib-->amio/TEE/DCCV.   CAD (coronary artery disease)    a. 01/2012 Cath: nonobs dzs; b. 04/2014 MV: No ischemia; c. 09/2017 Cath: Minimal nonobs RCA dzs, otw nl cors. 09/2023: Tikosyn  loading   Cardiorenal syndrome    a. 09/2017 Creat rose to 2.7 w/ diuresis-->HD x 2.   Chronic back pain    Chronic combined systolic (congestive) and diastolic (congestive) heart failure (HCC)    a. Dx 2005 @ [facility];  b. 02/2014 Echo: EF 55-60%; c. 07/2017: EF 30-35%; d. 09/2017 Echo: EF 40-45%; e. 02/2018 Echo: EF 55-60%; f. 08/2019 Echo: EF 60-65%; g. 08/2020 RHC: RA 3, RV 30/1, PA 33/11 (20), PCWP 6; f. 08/2021 Echo: EF 60-65%, no rwma,  GrIDD, nl RV fxn, RVSP 40.35mmHg, mod dil LA, mod TR.   Chronic respiratory failure (HCC)    a. on home O2   Gastritis    History of COVID-19    History  of endocarditis 08/22/2021   History of intracranial hemorrhage    a. 6/14 described by neurosurgery as "small frontal posttraumatic SAH " - pt was on xarelto  @ the time.   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 Chronic LLE edema.   NICM (nonischemic cardiomyopathy) (HCC)    a. 09/2017 Echo: EF 40-45%; b. 09/2017 Cath: minor, insignificant CAD; c. 02/2018 Echo: EF 55-60%, Gr1 DD; d. 08/2019 Echo: EF 60-65%, Gr2 DD.   Obesity    PAH (pulmonary artery hypertension) (HCC)    a. 09/2017 Echo: PASP ; b. 09/2017 RHC: RA 23, RV 84/13, PA 84/29, PCWP 20, CO 3.89, CI 1.89. LVEDP 18; c. 08/2020 RHC: RA 3, RV 30/1, PA 33/11 (20), PCWP 6. CO/CI 6.18/3.36. PVR 2.3 WU.   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 06/2021. Veg on PPM lead. Not candidate for extraction-->s/p 6 wks abx.   Sepsis with metabolic encephalopathy (HCC)    Sleep apnea    Streptococcal bacteremia    Stroke [facility]) 2014   Subarachnoid hemorrhage (HCC) 01/19/2013   Symptomatic bradycardia    a. s/p BSX PPM.   Thyroid  nodule    Type II diabetes mellitus (HCC)    Urinary incontinence     SURGICAL HISTORY: Past Surgical History:  Procedure Laterality Date   ABDOMINAL HYSTERECTOMY  1969   CARDIAC CATHETERIZATION  2013   @ [facility]: No obstructive CAD: Only 20% ostial left circumflex.   CARDIOVERSION N/A 10/12/2017   Procedure: CARDIOVERSION;  Surgeon: Devorah Fonder, MD;  Location: [facility] ORS;  Service: Cardiovascular;  Laterality: N/A;   CARDIOVERSION N/A 07/04/2021   Procedure: CARDIOVERSION;  Surgeon: Darlis Eisenmenger, MD;  Location: [facility] ENDOSCOPY;  Service: Cardiovascular;  Laterality: N/A;   CARDIOVERSION N/A 03/27/2023   Procedure: CARDIOVERSION;  Surgeon: Darlis Eisenmenger, MD;  Location: [facility] ORS;  Service: Cardiovascular;  Laterality: N/A;    CARDIOVERSION N/A 10/01/2023   Procedure: CARDIOVERSION;  Surgeon: Darlis Eisenmenger, MD;  Location: [facility] INVASIVE CV LAB;  Service: Cardiovascular;  Laterality: N/A;   CATARACT EXTRACTION W/PHACO Right 09/19/2020   Procedure: CATARACT EXTRACTION PHACO AND INTRAOCULAR LENS PLACEMENT (IOC) RIGHT DIABETIC 3.95 00:43.0 9.2%;  Surgeon: Annell Kidney, MD;  Location: [facility] SURGERY CNTR;  Service: Ophthalmology;  Laterality: Right;  Pt requests to be last sleep apnea   CATARACT EXTRACTION W/PHACO Left 10/03/2020   Procedure: CATARACT EXTRACTION PHACO AND INTRAOCULAR LENS PLACEMENT (IOC) LEFT DIABETIC;  Surgeon: Annell Kidney, MD;  Location: [facility] SURGERY CNTR;  Service: Ophthalmology;  Laterality: Left;  3.25 0:59.3 5.5%   CHOLECYSTECTOMY     COLONOSCOPY WITH PROPOFOL  N/A 09/10/2018   Procedure: COLONOSCOPY WITH PROPOFOL ;  Surgeon: Cassie Click, MD;  Location: [facility] ENDOSCOPY;  Service: Endoscopy;  Laterality: N/A;   COLONOSCOPY WITH PROPOFOL  N/A 10/22/2022   Procedure: COLONOSCOPY WITH PROPOFOL ;  Surgeon: Quintin Buckle, DO;  Location: [facility] ENDOSCOPY;  Service: Gastroenterology;  Laterality: N/A;   CYST REMOVAL TRUNK  2002   BACK   DIALYSIS/PERMA CATHETER INSERTION N/A 10/06/2017   Procedure: DIALYSIS/PERMA CATHETER INSERTION;  Surgeon: Prescilla Brod,  Ninette Basque, MD;  Location: [facility] INVASIVE CV LAB;  Service: Cardiovascular;  Laterality: N/A;   ESOPHAGOGASTRODUODENOSCOPY (EGD) WITH PROPOFOL  N/A 10/21/2022   Procedure: ESOPHAGOGASTRODUODENOSCOPY (EGD) WITH PROPOFOL ;  Surgeon: Quintin Buckle, DO;  Location: [facility] ENDOSCOPY;  Service: Gastroenterology;  Laterality: N/A;   GIVENS CAPSULE STUDY N/A 10/22/2022   Procedure: GIVENS CAPSULE STUDY;  Surgeon: Quintin Buckle, DO;  Location: [facility] ENDOSCOPY;  Service: Gastroenterology;  Laterality: N/A;  IF NOTHING IS FOUND WITH COLONOSCOPY   INSERT / REPLACE / REMOVE PACEMAKER     lipoma removal  2014   back   PACEMAKER INSERTION   2015   [employer] dual chamber pacemaker implanted by Dr Rodolfo Clan for symptomatic bradycardia   PERMANENT PACEMAKER INSERTION N/A 01/09/2014   Procedure: PERMANENT PACEMAKER INSERTION;  Surgeon: Verona Goodwill, MD;  Location: [facility] CATH LAB;  Service: Cardiovascular;  Laterality: N/A;   PPM GENERATOR CHANGEOUT N/A 04/01/2023   Procedure: PPM GENERATOR CHANGEOUT;  Surgeon: Verona Goodwill, MD;  Location: [facility] INVASIVE CV LAB;  Service: Cardiovascular;  Laterality: N/A;   RIGHT HEART CATH N/A 04/19/2018   Procedure: RIGHT HEART CATH;  Surgeon: Darlis Eisenmenger, MD;  Location: [facility] INVASIVE CV LAB;  Service: Cardiovascular;  Laterality: N/A;   RIGHT HEART CATH N/A 08/30/2020   Procedure: RIGHT HEART CATH;  Surgeon: Darlis Eisenmenger, MD;  Location: [facility] INVASIVE CV LAB;  Service: Cardiovascular;  Laterality: N/A;   RIGHT HEART CATH N/A 02/11/2022   Procedure: RIGHT HEART CATH;  Surgeon: Darlis Eisenmenger, MD;  Location: [facility] INVASIVE CV LAB;  Service: Cardiovascular;  Laterality: N/A;   RIGHT/LEFT HEART CATH AND CORONARY ANGIOGRAPHY N/A 10/19/2017   Procedure: RIGHT/LEFT HEART CATH AND CORONARY ANGIOGRAPHY;  Surgeon: Wenona Hamilton, MD;  Location: [facility] INVASIVE CV LAB;  Service: Cardiovascular;  Laterality: N/A;   RIGHT/LEFT HEART CATH AND CORONARY ANGIOGRAPHY N/A 07/20/2023   Procedure: RIGHT/LEFT HEART CATH AND CORONARY ANGIOGRAPHY;  Surgeon: Wenona Hamilton, MD;  Location: [facility] INVASIVE CV LAB;  Service: Cardiovascular;  Laterality: N/A;   TEE WITHOUT CARDIOVERSION N/A 08/20/2017   Procedure: TRANSESOPHAGEAL ECHOCARDIOGRAM (TEE) & Direct current cardioversion;  Surgeon: Devorah Fonder, MD;  Location: [facility] ORS;  Service: Cardiovascular;  Laterality: N/A;   TEE WITHOUT CARDIOVERSION N/A 06/28/2021   Procedure: TRANSESOPHAGEAL ECHOCARDIOGRAM (TEE);  Surgeon: Devorah Fonder, MD;  Location: [facility] ORS;  Service: Cardiovascular;  Laterality: N/A;   TEE WITHOUT CARDIOVERSION N/A 07/04/2021   Procedure:  TRANSESOPHAGEAL ECHOCARDIOGRAM (TEE);  Surgeon: Darlis Eisenmenger, MD;  Location: [facility] ENDOSCOPY;  Service: Cardiovascular;  Laterality: N/A;   TRIGGER FINGER RELEASE      SOCIAL HISTORY: Social History   Socioeconomic History   Marital status: Divorced    Spouse name: Not on file   Number of children: 4   Years of education: 12   Highest education level: Associate degree: occupational, Scientist, product/process development, or vocational program  Occupational History   Occupation: Retired- factory work    Comment: exposed to Interior and spatial designer  Tobacco Use   Smoking status: Former    Current packs/day: 0.00    Average packs/day: 0.3 packs/day for 2.0 years (0.6 ttl pk-yrs)    Types: Cigarettes    Start date: 06/23/1988    Quit date: 06/23/1990    Years since quitting: 33.3    Passive exposure: Past   Smokeless tobacco: Never   Tobacco comments:    Former smoker 10/08/23  Vaping Use   Vaping status: Never Used  Substance and Sexual Activity  Alcohol use: No   Drug use: No   Sexual activity: Not Currently    Birth control/protection: Surgical  Other Topics Concern   Not on file  Social History Narrative   Lives in [place] alone. Has 4 children.  Daughter lives 8 minutes away. No pets.      Work - Hotel manager, retired      Diet - regular      Remote hx of smoking > 30 years; No alcohol-    Social Drivers of Corporate investment banker Strain: Low Risk  (07/30/2023)   Overall Financial Resource Strain (CARDIA)    Difficulty of Paying Living Expenses: Not hard at all  Food Insecurity: No Food Insecurity (10/06/2023)   Hunger Vital Sign    Worried About Running Out of Food in the Last Year: Never true    Ran Out of Food in the Last Year: Never true  Transportation Needs: No Transportation Needs (10/06/2023)   PRAPARE - Administrator, Civil Service (Medical): No    Lack of Transportation (Non-Medical): No  Physical Activity: Inactive (07/30/2023)   Exercise Vital Sign    Days of Exercise per  Week: 0 days    Minutes of Exercise per Session: 0 min  Stress: No Stress Concern Present (07/30/2023)   [employer] of Occupational Health - Occupational Stress Questionnaire    Feeling of Stress : Not at all  Social Connections: Socially Isolated (09/29/2023)   Social Connection and Isolation Panel [NHANES]    Frequency of Communication with Friends and Family: More than three times a week    Frequency of Social Gatherings with Friends and Family: Once a week    Attends Religious Services: Never    Database administrator or Organizations: No    Attends Banker Meetings: Never    Marital Status: Widowed  Intimate Partner Violence: Not At Risk (10/06/2023)   Humiliation, Afraid, Rape, and Kick questionnaire    Fear of Current or Ex-Partner: No    Emotionally Abused: No    Physically Abused: No    Sexually Abused: No    FAMILY HISTORY: Family History  Problem Relation Age of Onset   Heart disease Mother    Hypertension Mother    COPD Mother        was a smoker   Thyroid  disease Mother    Hypertension Father    Hypertension Sister    Thyroid  disease Sister    Breast cancer Sister    Hypertension Sister    Thyroid  disease Sister    Bone cancer Sister    Stroke Son    Breast cancer Maternal Aunt    Kidney disease Neg Hx    Bladder Cancer Neg Hx     ALLERGIES:  is allergic to aspirin  and other.  MEDICATIONS:  Current Outpatient Medications  Medication Sig Dispense Refill   acetaminophen  (TYLENOL ) 325 MG tablet Take 325-650 mg by mouth every 6 (six) hours as needed for mild pain.     albuterol  (ACCUNEB ) 0.63 MG/3ML nebulizer solution Take 1 ampule by nebulization every 6 (six) hours as needed for wheezing.     apixaban  (ELIQUIS ) 5 MG TABS tablet Take 1 tablet (5 mg total) by mouth 2 (two) times daily. RESUME SUNDAY pm 180 tablet 3   atropine  1 % ophthalmic solution Place 1 drop into both eyes at bedtime.     cefadroxil  (DURICEF) 500 MG capsule Take 1 capsule  (500 mg total) by mouth 2 (  two) times daily. 60 capsule 12   dapagliflozin  propanediol (FARXIGA ) 10 MG TABS tablet Take 1 tablet (10 mg total) by mouth daily before breakfast. 90 tablet 1   dofetilide  (TIKOSYN ) 250 MCG capsule Take 1 capsule (250 mcg total) by mouth 2 (two) times daily. 60 capsule 4   gabapentin  (NEURONTIN ) 600 MG tablet Take 600 mg by mouth at bedtime.     lidocaine  (LIDODERM ) 5 % Place 2 patches onto the skin daily. Remove & Discard patch within 12 hours or as directed by MD 30 patch 0   macitentan  (OPSUMIT ) 10 MG tablet Take 1 tablet (10 mg total) by mouth daily. 30 tablet 11   meclizine  (ANTIVERT ) 25 MG tablet Take 1 tablet (25 mg total) by mouth 3 (three) times daily as needed for dizziness. 30 tablet 1   Melatonin 1 MG CAPS Take 1 capsule (1 mg total) by mouth at bedtime. (Patient taking differently: Take 5 mg by mouth at bedtime.) 30 capsule 0   Menthol , Topical Analgesic, (BIOFREEZE) 10 % CREA Apply 1 Application topically daily as needed (apply to back for pain).     midodrine  (PROAMATINE ) 5 MG tablet Take 1 tablet (5 mg total) by mouth 3 (three) times daily with meals. 90 tablet 5   oxyCODONE  (OXY IR/ROXICODONE ) 5 MG immediate release tablet Take 1 tablet (5 mg total) by mouth every 4 (four) hours as needed for moderate pain, breakthrough pain or severe pain. 12 tablet 0   OXYGEN  Inhale 4 L/min into the lungs continuous.     potassium chloride  SA (KLOR-CON  M) 20 MEQ tablet Take 40meq every morning by mouth and 40meq every evening.     prednisoLONE  acetate (PRED FORTE ) 1 % ophthalmic suspension Place 1 drop into both eyes 2 (two) times daily.     rosuvastatin  (CRESTOR ) 10 MG tablet Take 1 tablet (10 mg total) by mouth daily. 90 tablet 3   Selexipag  (UPTRAVI ) 1600 MCG TABS Take 1 tablet (1,600 mcg total) by mouth 2 (two) times daily. 60 tablet 11   sildenafil  (REVATIO ) 20 MG tablet Take 1 tablet (20 mg total) by mouth 3 (three) times daily. NEEDS FOLLOW UP APPOINTMENT FOR  ANYMORE REFILLS 90 tablet 0   spironolactone  (ALDACTONE ) 25 MG tablet Take 1 tablet by mouth daily.     torsemide  (DEMADEX ) 20 MG tablet Take 3 tablets (60 mg total) by mouth daily. 200 tablet 3   Vitamin D , Ergocalciferol , (DRISDOL ) 1.25 MG (50000 UNIT) CAPS capsule Take 1 capsule (50,000 Units total) by mouth every 7 (seven) days. 9 capsule 0   Current Facility-Administered Medications  Medication Dose Route Frequency Provider Last Rate Last Admin   diclofenac  Sodium (VOLTAREN ) 1 % topical gel 2 g  2 g Topical QID           PHYSICAL EXAMINATION:   Vitals:   10/19/23 1253  BP: (!) 142/68  Pulse: (!) 51  Resp: 20  Temp: (!) 97.1 F (36.2 C)  SpO2: 98%     Filed Weights   10/19/23 1253  Weight: 185 lb (83.9 kg)      Physical Exam Constitutional:      Comments: Patient in wheelchair.  Alone.  She is on O2 nasal cannula.  HENT:     Head: Normocephalic and atraumatic.     Mouth/Throat:     Pharynx: No oropharyngeal exudate.  Eyes:     Pupils: Pupils are equal, round, and reactive to light.  Cardiovascular:     Rate and Rhythm: Normal  rate and regular rhythm.  Pulmonary:     Effort: No respiratory distress.     Breath sounds: No wheezing.     Comments: Decreased air entry bilaterally.  No wheeze or crackles. Abdominal:     General: Bowel sounds are normal. There is no distension.     Palpations: Abdomen is soft. There is no mass.     Tenderness: There is no abdominal tenderness. There is no guarding or rebound.  Musculoskeletal:        General: No tenderness. Normal range of motion.     Cervical back: Normal range of motion and neck supple.  Skin:    General: Skin is warm.  Neurological:     Mental Status: She is alert and oriented to person, place, and time.  Psychiatric:        Mood and Affect: Affect normal.     LABORATORY DATA:  I have reviewed the data as listed Lab Results  Component Value Date   WBC 4.1 10/19/2023   HGB 10.8 (L) 10/19/2023   HCT  35.3 (L) 10/19/2023   MCV 89.1 10/19/2023   PLT 181 10/19/2023   Recent Labs    10/21/22 0404 10/22/22 0143 11/18/22 1302 12/19/22 1236 09/29/23 1433 09/30/23 0425 10/02/23 0525 10/08/23 1556 10/19/23 1255  NA 138   < > 142   < >  --    < > 138 141 139  K 3.9   < > 2.9*   < >  --    < > 4.7 3.5 3.9  CL 101   < > 104   < >  --    < > 102 105 103  CO2 29   < > 28   < >  --    < > 27 27 26   GLUCOSE 120*   < > 114*   < >  --    < > 100* 91 107*  BUN 19   < > 17   < >  --    < > 31* 19 25*  CREATININE 0.94   < > 0.85   < >  --    < > 1.54* 0.94 1.22*  CALCIUM  9.0   < > 8.5*   < >  --    < > 8.9 8.9 8.9  GFRNONAA >60   < > >60   < >  --    < > 34* >60 45*  PROT 6.7  --  6.8  --  6.8  --   --   --   --   ALBUMIN  3.7  --  3.6  --  3.8  --   --   --   --   AST 16  --  18  --  13*  --   --   --   --   ALT 13  --  15  --  9  --   --   --   --   ALKPHOS 102  --  97  --  83  --   --   --   --   BILITOT 0.6  --  0.4  --  0.6  --   --   --   --   BILIDIR 0.1  --   --   --  0.1  --   --   --   --   IBILI 0.5  --   --   --  0.5  --   --   --   --    < > =  values in this interval not displayed.     CUP PACEART REMOTE DEVICE CHECK Result Date: 10/04/2023 Scheduled remote reviewed. Normal device function.  Presenting rhythm: AP/AS-VP.  AF burden previously 100%, currently 0.1%.  Known AF, on Eliquis , admission for dofetilide  initiation 4/8 per Epic. Next remote 91 days. - CS, CV[facility]  EP STUDY Result Date: 10/01/2023 See surgical note for result.    Symptomatic anemia # Chronic Anemia [BL10-11]-iron  deficiency/? CKD stage III  currently s/p Venofer .   # [JULY 2024- ferritin 680 iron  sat 17%]; not on gentle iron  [sec to dyspepsia]  venofer  /Retacrit  prn-   # Blood in stool [on eliquis ]  # Etiology of anemia-unclear- CKD stage III  vs Others-[Dr.Lateef]/  S/p EGD/ capsule MAY 2024- NEG [hospital]; [facility] [Dr.Russo]- MAY 2024 - NEG. [chronic diarrhea/malabsorption]-monitor closely.  If anemia  is worse consider bone marrow biopsy  # Mild Hypokalemia- today- 4.5- continue Kdur BID;- stable.   # CAD/History of DVT/s/p IVC filter; ?  A. Fib-Eliquis - s/p cardio-conversion [Dr.Klein/Dr.Arida] - monitor closely on Eliquis - if increased bleeding pt will inform cardiology-   # Borderline low B12-OCT 2023- 230; s/p B12 injection.; proceed with injection q 2- 4 months.    # DISPOSITION: # proceed  b12 today and  venofer ; HOLD retacrit  NOtoday; # Follow up in 3 months-MD -  labs-- cbc;bmp; b12 levels;possible Venofer  OR retacrit ;  B12 injection- Dr.B  Cc; Vila Grayer     All questions were answered. The patient knows to call the clinic with any problems, questions or concerns.    Gwyn Leos, MD 10/19/2023 1:40 PM

## 2023-10-19 NOTE — Progress Notes (Signed)
 Declined 30 minute post-observation. Aware of risks. Vitals stable at discharge.

## 2023-10-19 NOTE — Patient Instructions (Signed)

## 2023-10-19 NOTE — Progress Notes (Signed)
 Fatigue/weakness: YES Dyspena: YES Light headedness: YES Blood in stool: YES-BRIGHT RED X2 MONTHS, SHE IS THINKING IT MAY BE DUE TO HER CHRONIC DIARRHEA  C/o pain in her left side, x2 months, 8/10.  She had a cardioversion 09/29/23, [facility] in [place].

## 2023-10-19 NOTE — Assessment & Plan Note (Addendum)
#   Chronic Anemia [BL10-11]-iron  deficiency/? CKD stage III  currently s/p Venofer .   # [JULY 2024- ferritin 680 iron  sat 17%]; not on gentle iron  [sec to dyspepsia]  venofer  /Retacrit  prn-   # Blood in stool [on eliquis ]  # Etiology of anemia-unclear- CKD stage III  vs Others-[Dr.Lateef]/  S/p EGD/ capsule MAY 2024- NEG [hospital]; [facility] [Dr.Russo]- MAY 2024 - NEG. [chronic diarrhea/malabsorption]-monitor closely.  If anemia is worse consider bone marrow biopsy  # Mild Hypokalemia- today- 4.5- continue Kdur BID;- stable.   # CAD/History of DVT/s/p IVC filter; ?  A. Fib-Eliquis - s/p cardio-conversion [Dr.Klein/Dr.Arida] - monitor closely on Eliquis - if increased bleeding pt will inform cardiology-   # Borderline low B12-OCT 2023- 230; s/p B12 injection.; proceed with injection q 2- 4 months.    # DISPOSITION: # proceed  b12 today and  venofer ; HOLD retacrit  NOtoday; # Follow up in 3 months-MD -  labs-- cbc;bmp; b12 levels;possible Venofer  OR retacrit ;  B12 injection- Dr.B  Cc; Vila Grayer

## 2023-10-23 ENCOUNTER — Other Ambulatory Visit: Payer: Self-pay | Admitting: Cardiology

## 2023-10-30 ENCOUNTER — Other Ambulatory Visit (HOSPITAL_COMMUNITY): Payer: Self-pay | Admitting: *Deleted

## 2023-10-30 ENCOUNTER — Other Ambulatory Visit (HOSPITAL_COMMUNITY): Payer: Self-pay | Admitting: Cardiology

## 2023-10-30 ENCOUNTER — Other Ambulatory Visit (HOSPITAL_COMMUNITY): Payer: Self-pay

## 2023-10-30 ENCOUNTER — Telehealth: Payer: Self-pay | Admitting: Cardiology

## 2023-10-30 MED ORDER — DOFETILIDE 250 MCG PO CAPS: 250.0000 ug | ORAL_CAPSULE | Freq: Two times a day (BID) | ORAL | 2 refills | Status: DC

## 2023-10-30 NOTE — Telephone Encounter (Addendum)
 Refills of medication were sent to [facility] in April with refills. Prescription resubmitted for fill today to [facility] but will not be delivered before pt will run out on Sunday. Verbal prescription called into [facility] at [address] [place]. Daughter notified and will pick up.

## 2023-10-30 NOTE — Telephone Encounter (Signed)
*  STAT* If patient is at the pharmacy, call can be transferred to refill team.   1. Which medications need to be refilled? (please list name of each medication and dose if known) dofetilide  (TIKOSYN ) 250 MCG capsule     4. Which pharmacy/location (including street and city if local pharmacy) is medication to be sent to? [id number] - [place]  - [address]     5. Do they need a 30 day or 90 day supply? 90   (Pharmacy stated they did not have this medication. Pt daughter states she is completely out)

## 2023-10-30 NOTE — Telephone Encounter (Signed)
This is a A-Fib clinic pt 

## 2023-11-02 NOTE — Telephone Encounter (Signed)
This is a A-Fib clinic pt 

## 2023-11-02 NOTE — Telephone Encounter (Signed)
 Pt's pharmacy  is requesting a refill on pt's medication sildenafil . Would Dr. Alvenia Aus like to refill this medication? Please address

## 2023-11-09 NOTE — Progress Notes (Signed)
 Remote pacemaker transmission.

## 2023-11-09 NOTE — Addendum Note (Signed)
 Addended by: Lott Rouleau A on: 11/09/2023 02:01 PM   Modules accepted: Orders

## 2023-11-10 DIAGNOSIS — Z950 Presence of cardiac pacemaker: Secondary | ICD-10-CM | POA: Insufficient documentation

## 2023-11-10 DIAGNOSIS — Z5181 Encounter for therapeutic drug level monitoring: Secondary | ICD-10-CM | POA: Insufficient documentation

## 2023-11-10 NOTE — Progress Notes (Signed)
 Electrophysiology Clinic Note    Date:  11/11/2023  Patient ID:  Debra, Saunders 27-Jan-1946, MRN [id number] PCP:  Debra Mascot, MD  Cardiologist:  Debra Kirks, MD HF cardiologist: Debra Bourdon, MD Electrophysiologist: Debra Chandler, MD   Discussed the use of AI scribe software for clinical note transcription with the patient, who gave verbal consent to proceed.   Patient Profile    Chief Complaint: tikosyn  follow-up  History of Present Illness: Debra Saunders is a 78 y.o. 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  She is s/p recent gen change complicated by post-op hematoma. I saw her 07/17/2023 where she c/o increased chest pain, SOB, and was in AFib, and I recommended eval in ER. She was not cardioverted during admission as there was concern she would not hold sinus without AAD. She saw Dr. Mitzie Saunders 07/2023, who recommended tikosyn .  She is s/p tikosyn  loading admission 09/2023, and was maintaining sinus when she saw PA Fenton in AF clinic on follow-up.   On follow-up today, she is tolerating tikosyn  very well, taking medication at 11a and 11p. She has had several episodes of AFib since starting the medication, but believes she is overall maintaining sinus rhythm.  She has recently stopped her farxiga  d/t not being able to obtain refill authorization from [place]. She continues to have lower extremity edema L >> R, but it is less swollen than it usually is.   Her sleep pattern is irregular, often staying up late and having difficulty falling asleep. She is active during the day, engaging in household activities and physical therapy exercises. She recently completed a course of physical therapy and is awaiting a follow-up appointment. She walks around her  house and occasionally outside, with family encouragement to increase activity.  She continues to take eliquis  BID, no missed doses, no bleeding concerns.   She is joined by her daughter, whom I have previously met.      Device Information: MDT dual chamber PPM, imp 12/2013; SND Previously had Bos sci generator  MDT Gen change 03/2023    AAD History: Amiodarone , stopped d/t concerns for ILD/pulm fibrosis  Tikosyn  - loaded 09/2023    ROS:  Please see the history of present illness. All other systems are reviewed and otherwise negative.    Physical Exam    VS:  BP 130/82 (BP Location: Left Arm, Patient Position: Sitting)   Pulse 69   Ht 5' (1.524 m)   Wt 187 lb 9.6 oz (85.1 kg)   SpO2 96%   BMI 36.64 kg/m  BMI: Body mass index is 36.64 kg/m.  Wt Readings from Last 3 Encounters:  11/11/23 187 lb 9.6 oz (85.1 kg)  10/19/23 185 lb (83.9 kg)  10/08/23 192 lb 3.2 oz (87.2 kg)     GEN- The patient is well appearing, alert and oriented x 3 today.   Lungs- Clear to ausculation bilaterally, normal work of breathing, [place] in place Heart- Regular rate and rhythm, no murmurs, rubs or gallops Extremities- 1-2+ peripheral LLL edema, no edema to RLL, warm, dry Skin-  device pocket well-healed, no tethering   Device interrogation done today and reviewed by myself:  Battery 12.5 years Lead thresholds, impedence, sensing stable  Presents in AF, appears persistent Dependent to VVI 40 today No changes  made   Studies Reviewed   Previous EP, cardiology notes.    EKG is ordered. Personal review of EKG from today shows:    EKG Interpretation Date/Time:  Wednesday Nov 11 2023 13:23:49 EDT Ventricular Rate:  58 PR Interval:  178 QRS Duration:  182 QT Interval:  512 QTC Calculation: 502 R Axis:   -85  Text Interpretation: AV dual-paced rhythm When compared with ECG of 08-Oct-2023 14:49, Vent. rate has decreased BY   8 BPM Confirmed by Nichlos Kunzler [phone]) on 11/11/2023 4:12:16  PM    10/08/2023 EKG - AV paced at 66, QRS ; QT 496 / QTc 519  10/02/2023 EKG - AV paced at 56, QRS 180; QT 534 / QTc 515    R/LHC, 07/20/2023 1.  Near normal coronary arteries. 2.  Left ventricular angiography was not performed.  EF was normal by echo. 3.  Right heart catheterization showed normal filling pressures, moderate pulmonary hypertension and normal cardiac output.   RA: 5 mmHg RV: 47/2 mmHg PW: 13 mmHg PA: 54/20 with a mean of 29 mmHg Cardiac output is 4.32 with an index of 2.36  RHC, 02/11/2022 1. Normal filling pressures.  2. Normal PA pressure.   TTE, 08/23/2021  1. Left ventricular ejection fraction, by estimation, is 60 to 65%. The left ventricle has normal function. The left ventricle has no regional wall motion abnormalities. There is mild left ventricular hypertrophy. Left ventricular diastolic parameters are consistent with Grade I diastolic dysfunction (impaired relaxation).   2. Right ventricular systolic function is normal. The right ventricular size is normal. There is mildly elevated pulmonary artery systolic pressure. The estimated right ventricular systolic pressure is 40.3 mmHg.   3. Left atrial size was moderately dilated.   4. The mitral valve is normal in structure. No evidence of mitral valve regurgitation. No evidence of mitral stenosis.   5. Tricuspid valve regurgitation is moderate.   6. The aortic valve is normal in structure. Aortic valve regurgitation is not visualized. Aortic valve sclerosis/calcification is present, without any evidence of aortic stenosis.   7. The inferior vena cava is normal in size with greater than 50% respiratory variability, suggesting right atrial pressure of 3 mmHg.   8. RV pacer wire difficult to visualize, grossly no vegetation noted.   Assessment and Plan    #) persis AFib #) tikosyn  monitoring S/p tikosyn  loading 09/2023 EKG today with stable paced QRS of She continues to have paroxysms of AFib, but  appears to be overall maintaining sinus per PPM interrogation data Update BMP, mag today Continue 250mcg tikosyn  BID   #) SND s/p PPM #) PPM device infection Device functioning well, see paceart for details     #) Hypercoag d/t afib CHA2DS2-VASc Score = 9 [CHF History: 1, HTN History: 1, Diabetes History: 1, Stroke History: 2, Vascular Disease History: 1, Age Score: 2, Gender Score: 1].  Therefore, the patient's annual risk of stroke is 12.2 %.    Stroke ppx - 5mg  eliquis  BID, appropriately dosed No bleeding concerns  #) HFpEF Refill provided for farxiga  Follows with Dr. Mitzie Saunders, appt scheduled for next week     Current medicines are reviewed at length with the patient today.   The patient does not have concerns regarding her medicines.  The following changes were made today:  none  Labs/ tests ordered today include:  Orders Placed This Encounter  Procedures   Basic metabolic panel with GFR   Magnesium    EKG 12-Lead  Disposition: Follow up with Dr. Rodolfo Clan or EP APP in 3 months    Signed, Amoy Steeves, NP  11/11/23  4:12 PM  Electrophysiology [facility]

## 2023-11-11 ENCOUNTER — Ambulatory Visit: Attending: Cardiology | Admitting: Cardiology

## 2023-11-11 ENCOUNTER — Encounter: Payer: Self-pay | Admitting: Cardiology

## 2023-11-11 VITALS — BP 130/82 | HR 69 | Ht 60.0 in | Wt 187.6 lb

## 2023-11-11 DIAGNOSIS — I4819 Other persistent atrial fibrillation: Secondary | ICD-10-CM

## 2023-11-11 DIAGNOSIS — I5032 Chronic diastolic (congestive) heart failure: Secondary | ICD-10-CM

## 2023-11-11 DIAGNOSIS — Z5181 Encounter for therapeutic drug level monitoring: Secondary | ICD-10-CM | POA: Diagnosis not present

## 2023-11-11 DIAGNOSIS — D6869 Other thrombophilia: Secondary | ICD-10-CM | POA: Diagnosis not present

## 2023-11-11 DIAGNOSIS — Z950 Presence of cardiac pacemaker: Secondary | ICD-10-CM | POA: Diagnosis not present

## 2023-11-11 DIAGNOSIS — I495 Sick sinus syndrome: Secondary | ICD-10-CM | POA: Diagnosis not present

## 2023-11-11 DIAGNOSIS — Z79899 Other long term (current) drug therapy: Secondary | ICD-10-CM

## 2023-11-11 MED ORDER — DAPAGLIFLOZIN PROPANEDIOL 10 MG PO TABS: 10.0000 mg | ORAL_TABLET | Freq: Every day | ORAL | 1 refills | Status: DC

## 2023-11-11 MED ORDER — DOFETILIDE 250 MCG PO CAPS: 250.0000 ug | ORAL_CAPSULE | Freq: Two times a day (BID) | ORAL | 2 refills | Status: AC

## 2023-11-11 NOTE — Patient Instructions (Signed)
 Medication Instructions:  The current medical regimen is effective;  continue present plan and medications as directed. Please refer to the Current Medication list given to you today.   *If you need a refill on your cardiac medications before your next appointment, please call your pharmacy*  Lab Work: Your provider would like for you to have following labs drawn today BMET. MAG.   If you have labs (blood work) drawn today and your tests are completely normal, you will receive your results only by: MyChart Message (if you have MyChart) OR A paper copy in the mail If you have any lab test that is abnormal or we need to change your treatment, we will call you to review the results.  Follow-Up: At [facility], you and your health needs are our priority.  As part of our continuing mission to provide you with exceptional heart care, our providers are all part of one team.  This team includes your primary Cardiologist (physician) and Advanced Practice Providers or APPs (Physician Assistants and Nurse Practitioners) who all work together to provide you with the care you need, when you need it.  Your next appointment:   3 month(s)  Provider:   Suzann Riddle, NP    We recommend signing up for the patient portal called "MyChart".  Sign up information is provided on this After Visit Summary.  MyChart is used to connect with patients for Virtual Visits (Telemedicine).  Patients are able to view lab/test results, encounter notes, upcoming appointments, etc.  Non-urgent messages can be sent to your provider as well.   To learn more about what you can do with MyChart, go to [email or web address].

## 2023-11-12 ENCOUNTER — Ambulatory Visit: Payer: Self-pay | Admitting: Cardiology

## 2023-11-12 LAB — BASIC METABOLIC PANEL WITH GFR
BUN/Creatinine Ratio: 23 (ref 12–28)
BUN: 25 mg/dL (ref 8–27)
CO2: 21 mmol/L (ref 20–29)
Calcium: 9.4 mg/dL (ref 8.7–10.3)
Chloride: 105 mmol/L (ref 96–106)
Creatinine, Ser: 1.07 mg/dL — ABNORMAL HIGH (ref 0.57–1.00)
Glucose: 83 mg/dL (ref 70–99)
Potassium: 4.7 mmol/L (ref 3.5–5.2)
Sodium: 141 mmol/L (ref 134–144)
eGFR: 53 mL/min/{1.73_m2} — ABNORMAL LOW (ref >59)

## 2023-11-12 LAB — MAGNESIUM: Magnesium: 2.4 mg/dL — ABNORMAL HIGH (ref 1.6–2.3)

## 2023-11-18 ENCOUNTER — Telehealth: Payer: Self-pay | Admitting: Family

## 2023-11-18 NOTE — Telephone Encounter (Signed)
 Called to confirm/remind patient of their appointment at the [facility] on 11/19/23.   Appointment:   [x] Confirmed  [] Left mess   [] No answer/No voice mail  [] VM Full/unable to leave message  [] Phone not in service  Patient reminded to bring all medications and/or complete list.  Confirmed patient has transportation. Gave directions, instructed to utilize valet parking.

## 2023-11-19 ENCOUNTER — Ambulatory Visit: Attending: Cardiology | Admitting: Cardiology

## 2023-11-19 VITALS — BP 103/65 | HR 64 | Ht 60.0 in

## 2023-11-19 DIAGNOSIS — Z9981 Dependence on supplemental oxygen: Secondary | ICD-10-CM | POA: Diagnosis not present

## 2023-11-19 DIAGNOSIS — I2510 Atherosclerotic heart disease of native coronary artery without angina pectoris: Secondary | ICD-10-CM | POA: Insufficient documentation

## 2023-11-19 DIAGNOSIS — G4733 Obstructive sleep apnea (adult) (pediatric): Secondary | ICD-10-CM | POA: Insufficient documentation

## 2023-11-19 DIAGNOSIS — J449 Chronic obstructive pulmonary disease, unspecified: Secondary | ICD-10-CM | POA: Diagnosis not present

## 2023-11-19 DIAGNOSIS — Z8673 Personal history of transient ischemic attack (TIA), and cerebral infarction without residual deficits: Secondary | ICD-10-CM | POA: Insufficient documentation

## 2023-11-19 DIAGNOSIS — E1122 Type 2 diabetes mellitus with diabetic chronic kidney disease: Secondary | ICD-10-CM | POA: Insufficient documentation

## 2023-11-19 DIAGNOSIS — I272 Pulmonary hypertension, unspecified: Secondary | ICD-10-CM | POA: Diagnosis not present

## 2023-11-19 DIAGNOSIS — Z7901 Long term (current) use of anticoagulants: Secondary | ICD-10-CM | POA: Insufficient documentation

## 2023-11-19 DIAGNOSIS — E785 Hyperlipidemia, unspecified: Secondary | ICD-10-CM | POA: Diagnosis not present

## 2023-11-19 DIAGNOSIS — I890 Lymphedema, not elsewhere classified: Secondary | ICD-10-CM | POA: Insufficient documentation

## 2023-11-19 DIAGNOSIS — I130 Hypertensive heart and chronic kidney disease with heart failure and stage 1 through stage 4 chronic kidney disease, or unspecified chronic kidney disease: Secondary | ICD-10-CM | POA: Insufficient documentation

## 2023-11-19 DIAGNOSIS — I4819 Other persistent atrial fibrillation: Secondary | ICD-10-CM

## 2023-11-19 DIAGNOSIS — N183 Chronic kidney disease, stage 3 unspecified: Secondary | ICD-10-CM | POA: Diagnosis not present

## 2023-11-19 DIAGNOSIS — I951 Orthostatic hypotension: Secondary | ICD-10-CM | POA: Insufficient documentation

## 2023-11-19 DIAGNOSIS — J9611 Chronic respiratory failure with hypoxia: Secondary | ICD-10-CM | POA: Insufficient documentation

## 2023-11-19 DIAGNOSIS — Z86718 Personal history of other venous thrombosis and embolism: Secondary | ICD-10-CM | POA: Diagnosis not present

## 2023-11-19 DIAGNOSIS — Z950 Presence of cardiac pacemaker: Secondary | ICD-10-CM | POA: Diagnosis not present

## 2023-11-19 DIAGNOSIS — Z7984 Long term (current) use of oral hypoglycemic drugs: Secondary | ICD-10-CM | POA: Diagnosis not present

## 2023-11-19 DIAGNOSIS — I495 Sick sinus syndrome: Secondary | ICD-10-CM | POA: Diagnosis not present

## 2023-11-19 DIAGNOSIS — I5032 Chronic diastolic (congestive) heart failure: Secondary | ICD-10-CM

## 2023-11-19 DIAGNOSIS — Z79899 Other long term (current) drug therapy: Secondary | ICD-10-CM | POA: Diagnosis not present

## 2023-11-19 DIAGNOSIS — I4891 Unspecified atrial fibrillation: Secondary | ICD-10-CM | POA: Insufficient documentation

## 2023-11-19 DIAGNOSIS — Z87891 Personal history of nicotine dependence: Secondary | ICD-10-CM | POA: Insufficient documentation

## 2023-11-19 NOTE — Patient Instructions (Signed)
 ECHOCARDIOGRAM AS SCHEDULED   Follow-Up in: 3 MONTHS PLEASE CALL OUR OFFICE AROUND JULY  TO GET SCHEDULED FOR YOUR APPOINTMENT  At the [facility], you and your health needs are our priority. We have a designated team specialized in the treatment of Heart Failure. This Care Team includes your primary Heart Failure Specialized Cardiologist (physician), Advanced Practice Providers (APPs- Physician Assistants and Nurse Practitioners), and Pharmacist who all work together to provide you with the care you need, when you need it.   You may see any of the following providers on your designated Care Team at your next follow up:  Dr. Jules Oar Dr. [name] Dr. Alwin Baars Dr. Judyth Nunnery Nieves Bars, NP Ruddy Corral, [place] [facility] [place], [place] Dennise Fitz, NP [place] Lee, NP Luster Salters, PharmD   Please be sure to bring in all your medications bottles to every appointment.   Need to Contact Us :  If you have any questions or concerns before your next appointment please send us  a message through [place] or call our office at [phone].    TO LEAVE A MESSAGE FOR THE NURSE SELECT OPTION 2, PLEASE LEAVE A MESSAGE INCLUDING: YOUR NAME DATE OF BIRTH CALL BACK NUMBER REASON FOR CALL**this is important as we prioritize the call backs  YOU WILL RECEIVE A CALL BACK THE SAME DAY AS LONG AS YOU CALL BEFORE 4:00 PM

## 2023-11-19 NOTE — Progress Notes (Signed)
 Date:  11/19/2023   ID:  KASSITY WOODSON, DOB Jun 15, 1946, MRN [id number]   Provider location:  [facility] Type of Visit: Established patient   PCP:  [name], MD  Cardiologist:  [name], MD HF Cardiologist: Dr. Mitzie Anda  Chief Complaint: HF follow up   History of Present Illness: Debra Saunders is a 78 y.o. 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   Admitted 10/24-10/29/19 with acute on chronic diastolic HF. HF team consulted with concerns for low output. PICC was placed and milrinone  to support RV function was started along with IV Lasix  to facilitate diuresis. RHC done to evaluate PAH (see below). V/Q scan negative. High res CT showed pulmonary fibrosis. Pulmonary consult and recommended stopping amiodarone . She was transitioned to torsemide  60 mg BID. DC weight: 226 lbs.    She has started on selexipag  for pulmonary hypertension in addition to Opsumit  and Revatio .      Echo in 3/21 showed EF 60-65%, normal RV, PASP 50 mmHg, moderate-severe MR.   Had RHC 08/30/2020- low/normal filling pressures, mild pumonary htn, and preserved cardiac. output.   Admitted to [facility] 1/23 with endocarditis and PNA, requiring pressors. BCx positive for Streptococcus group G.  All her PAH meds were held. TEE showed a large vegetation on the pacer wire in the right atrium above the tricuspid valve (1 x 1.5 cm).  No vegetations noted on the valves.  EF was 55% and there was note of mild to moderate mitral regurgitation. RV ok. She was transferred to [facility] for pacemaker extraction. EP felt she was not a candidate due to multiple comorbidities. ID consulted and  planned for 6 weeks of IV abx. She developed Afib with RVR and started on amio. She underwent successful TEE-guided DCCV 07/14/21. Hospitalization complicated by anemia, hypotension, and AKI.  Midodrine  started during admission. She was discharged to SNF, weight 198 lbs.  Admitted 3/23 with lightheadedness. Treated for sinus infection, HF meds held due to othostasis. Midodrine  increased to 10 mg tid. HH PT arranged.  Repeat echo in 3/23 showed EF 60-65%, mild LVH, normal RV, IVC normal, no definite vegetation noted.   Follow up 8/23, continued with dyspnea, despite euvolemia. Amiodarone  stopped, in case this was causing her symptoms, and arranged for RHC. RHC showed showed normal filling and PA pressures. Felt symptoms were pulmonary-driven.  She was admitted in 1/25 with chest pain. She was in atrial fibrillation. She was diuresed, RHC/LHC was done showing minimal CAD and moderate pulmonary hypertension with normal filling pressures after diuresis.   She was started on Tikosyn  in 4/25 then cardioverted to NSR.    Today she returns for HF follow up with her daughter.  Weight is down 7 lbs compared to last appointment. She uses 4L home oxygen  during the day and CPAP at night. She is using midodrine  and denies lightheadedness with standing.  Rare palpitations, she is in NSR today.  She does ok walking around her house, no dyspnea as long as she is wearing oxygen . She is able to cook and clean if she takes it slowly.  She raises the head of her bed chronically.  No chest pain.    ECG (5/25, personally reviewed):  AV dual pacing with paced QTc 502 msec  [employer] device interrogation: rare AF, 98.6% V-pacing  6 minute walk (1/20): 98 m 6 minute walk (7/20): 213 m 6 minute walk (3/22): 198 m 6 minute walk (10/22): 229 m   Labs (6/24): K 3.6, creatinine 0.93 Labs (8/24): K 4.4, creatinine 0.97 Labs (1/25): K 3.9, creatinine 0.68, BNP 336 Labs (5/25): K 4.7, creatinine 1.07   PMH: 1.  Lymphedema: Left lower leg (chronic).  2. COPD 3. Pulmonary fibrosis/ILD: On home oxygen .  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  - PFTs (2017) with FEV1 64%, FEV1/FVC ratio 106%, TLC 49%, DLCO 39% (restrictive).  - CT chest high resolution (10/19): Pulmonary parenchymal pattern of fibrosis.  - CT chest (4/22): Chronic ILD 4. Atrial fibrillation: Paroxysmal.  She was previously off amiodarone  due to lung disease.  - 2/19 DCCV.  - 1/23 DCCV - 4/25 started Tikosyn  and cardioverted to NSR.  5. H/o DVT s/p IVC filter in 2004.  6. CAD: 4/19 coronary angiography with minimal coronary disease.  - LHC (1/25): Minimal coronary disease.  7. Bradycardia: [employer] PPM.  8. H/o traumatic SAH 2014.  9. CKD: Stage 3.  10. H/o CVA 11. OSA: CPAP 12. HTN 13. Type II diabetes 14. Pulmonary hypertension:  - PFTs 2017 with severe restriction.  - Echo (10/19): EF 60-65%, PASP 52 mmHg - RHC (4/19): Severe PAH with PVR 7.2 WU.  - RHC (10/19): mean RA 6, PA 56/17 mean 33, PCWP mean 12, CI 3.23, PVR 3.4 WU.  - RHC (3/22): mean 3, RV 30/1, PA 33/11, mean 20, PCWP mean 6, Oxygen  saturations: PA 69% AO 100%, Cardiac Output (Fick) 6.18, Cardiac Index (Fick) 3.36, and PVR 2.3 WU - V/Q scan (10/19): No chronic PE.  - Anti-SCL 70 and ANA negative.  ACE normal.  RF slightly elevated at 15.  - Echo (3/21): EF 60-65%, normal RV, PASP 50 mmHg, moderate-severe MR. - TEE (1/23): EF 60-65%, mild RV dilation with normal RV systolic function, MR mild-moderate, moderate TR with vegetation on TV and pacemaker lead - Echo (1/23): EF 60-65%, normal RV, RVSP 51.5 mmHg, mild-moderate MR, moderate TR - Echo (3/23): EF 60-65%, mild LVH, normal RV, IVC normal, no definite vegetation noted.  - RHC (8/23): RA 3, PA 29/10 (mean 16), PCWP 12, CO/CI (Fick) 6.18/3.37 - RHC (1/25): mean RA 5, PA 54/20 mean 29, mean PCWP 13, CI 2.36, PVR 3.7 WU.  15. 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  16. Mitral regurgitation: moderate to  severe on 3/22 echo.  17. Zio patch (3/22): 9.3% PACs, no AF, 2.9% PVCs 18. 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  Deemed too high risk to remove device so have tried to treat through.  19. Fe deficiency anemia 20. B12 deficiency  FH: mother with CHF  Current Outpatient Medications  Medication Sig Dispense Refill   acetaminophen  (TYLENOL ) 325 MG tablet Take 325-650 mg by mouth every 6 (six) hours as needed for mild pain.     albuterol  (ACCUNEB ) 0.63 MG/3ML nebulizer solution Take 1 ampule by nebulization every 6 (six) hours as needed for wheezing.     apixaban  (ELIQUIS ) 5 MG TABS tablet Take 1 tablet (5 mg total) by mouth 2 (two) times daily. RESUME SUNDAY pm 180 tablet 3   atropine  1 % ophthalmic solution Place 1 drop into both eyes at bedtime.     cefadroxil  (DURICEF) 500 MG capsule Take 1 capsule (500 mg total) by mouth 2 (two) times  daily. 60 capsule 12   dapagliflozin  propanediol (FARXIGA ) 10 MG TABS tablet Take 1 tablet (10 mg total) by mouth daily before breakfast. 90 tablet 1   dofetilide  (TIKOSYN ) 250 MCG capsule Take 1 capsule (250 mcg total) by mouth 2 (two) times daily. 180 capsule 2   gabapentin  (NEURONTIN ) 600 MG tablet Take 600 mg by mouth at bedtime.     lidocaine  (LIDODERM ) 5 % Place 2 patches onto the skin daily. Remove & Discard patch within 12 hours or as directed by MD 30 patch 0   macitentan  (OPSUMIT ) 10 MG tablet Take 1 tablet (10 mg total) by mouth daily. 30 tablet 11   meclizine  (ANTIVERT ) 25 MG tablet Take 1 tablet (25 mg total) by mouth 3 (three) times daily as needed for dizziness. 30 tablet 1   Melatonin 1 MG CAPS Take 1 capsule (1 mg total) by mouth at bedtime. (Patient taking differently: Take 5 mg by mouth at bedtime.) 30 capsule 0   Menthol , Topical Analgesic, (BIOFREEZE) 10 % CREA Apply 1 Application topically daily as needed (apply to back for pain).     midodrine  (PROAMATINE ) 5 MG tablet Take 1 tablet (5 mg total) by mouth 3  (three) times daily with meals. 90 tablet 5   oxyCODONE  (OXY IR/ROXICODONE ) 5 MG immediate release tablet Take 1 tablet (5 mg total) by mouth every 4 (four) hours as needed for moderate pain, breakthrough pain or severe pain. 12 tablet 0   OXYGEN  Inhale 4 L/min into the lungs continuous.     potassium chloride  SA (KLOR-CON  M) 20 MEQ tablet Take 40meq every morning by mouth and 40meq every evening.     prednisoLONE  acetate (PRED FORTE ) 1 % ophthalmic suspension Place 1 drop into both eyes 2 (two) times daily.     rosuvastatin  (CRESTOR ) 10 MG tablet Take 1 tablet (10 mg total) by mouth daily. 90 tablet 3   sildenafil  (REVATIO ) 20 MG tablet TAKE 1 TABLET BY MOUTH THREE TIMES DAILY. FOLLOW UP APPOINTMENT NEEDED FOR FURTHER REFILLS. 90 tablet 0   spironolactone  (ALDACTONE ) 25 MG tablet Take 1 tablet by mouth daily.     torsemide  (DEMADEX ) 20 MG tablet Take 3 tablets (60 mg total) by mouth daily. 200 tablet 3   UPTRAVI  1600 MCG TABS Take 1 tablet twice daily. 60 tablet 11   Current Facility-Administered Medications  Medication Dose Route Frequency Provider Last Rate Last Admin   diclofenac  Sodium (VOLTAREN ) 1 % topical gel 2 g  2 g Topical QID        Allergies:   Aspirin  and Other   Social History:  The patient  reports that she quit smoking about 33 years ago. Her smoking use included cigarettes. She started smoking about 35 years ago. She has a 0.6 pack-year smoking history. She has been exposed to tobacco smoke. She has never used smokeless tobacco. She reports that she does not drink alcohol and does not use drugs.   Family History:  The patient's family history includes Bone cancer in her sister; Breast cancer in her maternal aunt and sister; COPD in her mother; Heart disease in her mother; Hypertension in her father, mother, sister, and sister; Stroke in her son; Thyroid  disease in her mother, sister, and sister.   ROS:  Please see the history of present illness.   All other systems are  personally reviewed and negative.   Recent Labs: 07/27/2023: BNP 170.6 09/29/2023: ALT 9 10/19/2023: Hemoglobin 10.8; Platelet Count 181 11/11/2023: BUN 25; Creatinine, Ser 1.07;  Magnesium  2.4; Potassium 4.7; Sodium 141  Personally reviewed   Wt Readings from Last 3 Encounters:  11/11/23 187 lb 9.6 oz (85.1 kg)  10/19/23 185 lb (83.9 kg)  10/08/23 192 lb 3.2 oz (87.2 kg)    BP 103/65 (BP Location: Right Arm, Patient Position: Sitting, Cuff Size: Normal)   Pulse 64   Ht 5' (1.524 m)   SpO2 98%   BMI 36.64 kg/m   Physical Exam General: NAD Neck: No JVD, no thyromegaly or thyroid  nodule.  Lungs: Clear to auscultation bilaterally with normal respiratory effort. CV: Nondisplaced PMI.  Heart regular S1/S2, no S3/S4, 2/6 SEM RUSB.  Chronic left leg lymphedema.  No carotid bruit.  Normal pedal pulses.  Abdomen: Soft, nontender, no hepatosplenomegaly, no distention.  Skin: Intact without lesions or rashes.  Neurologic: Alert and oriented x 3.  Psych: Normal affect. Extremities: No clubbing or cyanosis.  HEENT: Normal.   Assessment & Plan 1. Atrial fibrillation with tachy-brady syndrome:  Had been off amiodarone  since 2019 due to interstitial lung disease/pulmonary fibrosis. Amiodarone  restarted during 1/23 admission. s/p TEE-guided DCCV 1/23, amiodarone  later stopped again.  Tikosyn  begun 4/25 and she was cardioverted back to NSR.  She is in NSR today. Paced QTc is acceptable on today's ECG.  - Continue Tikosyn  250 mcg bid.    - Continue Eliquis  5 mg bid.   2. 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 Echo in 3/23 showed EF 60-65%, mild LVH, normal RV, IVC normal, no definite vegetation noted. She has been on midodrine  for chronic hypotension but BP has been higher recently.  Repeat RHC (8/23) showed normal filling and PA pressures.  RHC in 1/25 showed PA 54/20 with PVR 3.7 WU.  NYHA class II-III, not volume overloaded.  Seems to be doing better in NSR.   - Continue torsemide  60 mg daily.  Recent BMET stable.  - Continue spironolactone  25 mg daily.  - Continue Farxiga  10 mg daily.  - I will arrange echo.  3. Pulmonary hypertension: Based on 4/19 cath, she had severe PAH with PVR 7.2 WU. Chronic hypoxemic respiratory failure, on home oxygen .  She has a history of remote DVT (2004) but V/Q scan negative for evidence of chronic PE.  High resolution CT chest in 10/19 showed pulmonary fibrosis, and PFTs in 2017 showed severe restriction.  Serological workup for rheumatological diseases was unremarkable.  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 Amiodarone  was stopped in past given lung parenchymal disease. 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 RHC (8/23) showed normal filling and PA pressures. RHC in 1/25 showed PA 54/20 with PVR 3.7 WU. - Continue macitentan  10 daily.  - Continue Selexipag  1600 bid  - Continue sildenafil  20 mg tid.  - Continue home oxygen  and CPAP.  4. Endocarditis: in 1/23.  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  Group G strep bacteremia.  Septic emboli to lungs. Seen by EP, thought to be too high risk for device extraction. Unfortunately she will be at high risk for complications going forward.  - She is now on lifetime suppression with cefadroxil .  5. Chronic hypoxemic respiratory failure:  At baseline, on 4L home oxygen  with CPAP at night for interstitial lung disease and OSA.   - Continue  home O2 4 L by [place] (baseline).  - Continue CPAP.  6. H/o DVT: Anticoagulated.  7. Left leg lymphedema: Chronic, unchanged.  8. OSA: CPAP.   9. Orthostatic hypotension: She was unable to decrease midodrine , will continue.   10. Bradycardia: has [employer] PPM.    Followup in 3 months.   I spent 31 minutes reviewing records, interviewing/examining patient, and managing orders.   Signed, [name], MD  11/19/2023  [facility] [address] [facility] [place] [address] [phone] (office) [phone] (fax)

## 2023-11-24 ENCOUNTER — Other Ambulatory Visit (INDEPENDENT_AMBULATORY_CARE_PROVIDER_SITE_OTHER)

## 2023-11-24 DIAGNOSIS — N1831 Chronic kidney disease, stage 3a: Secondary | ICD-10-CM | POA: Diagnosis not present

## 2023-11-24 DIAGNOSIS — E559 Vitamin D deficiency, unspecified: Secondary | ICD-10-CM

## 2023-11-24 DIAGNOSIS — E782 Mixed hyperlipidemia: Secondary | ICD-10-CM

## 2023-11-25 LAB — LIPID PANEL
Cholesterol: 137 mg/dL (ref 0–200)
HDL: 67.8 mg/dL (ref >39.00)
LDL Cholesterol: 41 mg/dL (ref 0–99)
NonHDL: 69.66
Total CHOL/HDL Ratio: 2
Triglycerides: 145 mg/dL (ref 0.0–149.0)
VLDL: 29 mg/dL (ref 0.0–40.0)

## 2023-11-25 LAB — MICROALBUMIN / CREATININE URINE RATIO
Creatinine,U: 148.5 mg/dL
Microalb Creat Ratio: 56.6 mg/g — ABNORMAL HIGH (ref 0.0–30.0)
Microalb, Ur: 8.4 mg/dL — ABNORMAL HIGH (ref 0.0–1.9)

## 2023-11-25 LAB — VITAMIN D 25 HYDROXY (VIT D DEFICIENCY, FRACTURES): VITD: 13.81 ng/mL — ABNORMAL LOW (ref 30.00–100.00)

## 2023-11-26 ENCOUNTER — Ambulatory Visit: Payer: Self-pay

## 2023-11-26 DIAGNOSIS — E1121 Type 2 diabetes mellitus with diabetic nephropathy: Secondary | ICD-10-CM | POA: Insufficient documentation

## 2023-11-26 DIAGNOSIS — E559 Vitamin D deficiency, unspecified: Secondary | ICD-10-CM

## 2023-11-26 MED ORDER — VITAMIN D (ERGOCALCIFEROL) 1.25 MG (50000 UNIT) PO CAPS: 50000.0000 [IU] | ORAL_CAPSULE | ORAL | 0 refills | Status: DC

## 2023-11-26 NOTE — Progress Notes (Signed)
 Please convey the following to the patient:   She continues to have low vitamin D  levels, so I recommend taking 50,000 units of vitamin D  once a week. Prescription sent to the pharmacy.  Additionally, her urine microalbumin is elevated compared to her last lab results. To help protect her kidney function, we can start a medication called Irbesartan at a dose of 75 mg once a day. This medication can also help reduce blood pressure, but it may potentially make her feel tired, cause a cough, or lead to high serum potassium levels. If she prefers to discuss this with her nephrologist before starting the medication, I am okay with that.   Lastly, her cholesterol levels are normal.  Thank you,  [name], MD

## 2023-12-01 ENCOUNTER — Other Ambulatory Visit (HOSPITAL_COMMUNITY): Payer: Self-pay | Admitting: Cardiology

## 2023-12-01 NOTE — Telephone Encounter (Signed)
 Please let the patient/daughter know there is no interactions of taking vitamin D  with Tikosyn .   Thank you,  Debra Mascot, MD

## 2023-12-16 ENCOUNTER — Ambulatory Visit: Payer: Medicare HMO | Admitting: Cardiology

## 2023-12-29 ENCOUNTER — Ambulatory Visit

## 2023-12-29 ENCOUNTER — Ambulatory Visit (INDEPENDENT_AMBULATORY_CARE_PROVIDER_SITE_OTHER)

## 2023-12-29 VITALS — BP 118/66 | HR 87 | Temp 98.6°F | Ht 60.0 in | Wt 183.4 lb

## 2023-12-29 DIAGNOSIS — I5032 Chronic diastolic (congestive) heart failure: Secondary | ICD-10-CM

## 2023-12-29 DIAGNOSIS — G629 Polyneuropathy, unspecified: Secondary | ICD-10-CM

## 2023-12-29 DIAGNOSIS — E559 Vitamin D deficiency, unspecified: Secondary | ICD-10-CM | POA: Diagnosis not present

## 2023-12-29 DIAGNOSIS — D631 Anemia in chronic kidney disease: Secondary | ICD-10-CM

## 2023-12-29 DIAGNOSIS — K529 Noninfective gastroenteritis and colitis, unspecified: Secondary | ICD-10-CM

## 2023-12-29 DIAGNOSIS — N1832 Chronic kidney disease, stage 3b: Secondary | ICD-10-CM

## 2023-12-29 DIAGNOSIS — G8929 Other chronic pain: Secondary | ICD-10-CM

## 2023-12-29 DIAGNOSIS — I482 Chronic atrial fibrillation, unspecified: Secondary | ICD-10-CM

## 2023-12-29 DIAGNOSIS — E538 Deficiency of other specified B group vitamins: Secondary | ICD-10-CM

## 2023-12-29 DIAGNOSIS — M545 Low back pain, unspecified: Secondary | ICD-10-CM

## 2023-12-29 MED ORDER — GABAPENTIN 600 MG PO TABS: 600.0000 mg | ORAL_TABLET | Freq: Every day | ORAL | 1 refills | Status: DC

## 2023-12-29 MED ORDER — VITAMIN D (ERGOCALCIFEROL) 1.25 MG (50000 UNIT) PO CAPS: 50000.0000 [IU] | ORAL_CAPSULE | ORAL | 3 refills | Status: AC

## 2023-12-29 NOTE — Assessment & Plan Note (Signed)
 Only started taking vitamin D  about a month ago. Continue once weekly vitamin D  50000 units. Repeat vitamin D  level during f/u appointment in about 6 months.

## 2023-12-29 NOTE — Progress Notes (Signed)
 Established Patient Office Visit   Subjective  Patient ID: Debra Saunders, female    DOB: 10-06-1945  Age: 78 y.o. MRN: [id number]  Chief Complaint  Patient presents with   Establish Care    She  has a past medical history of Abdominal pain (07/06/2021), Acute hypokalemia (07/18/2023), Acute on chronic heart failure with preserved ejection fraction (HFpEF) (HCC) (07/17/2023), Arthritis, Atrial fibrillation, persistent (HCC), CAD (coronary artery disease), Cardiorenal syndrome, Chronic back pain, Chronic combined systolic (congestive) and diastolic (congestive) heart failure (HCC), Chronic respiratory failure (HCC), Gastritis, History of COVID-19, History of endocarditis (08/22/2021), History of intracranial hemorrhage, Hyperlipidemia, Hypertension, 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  HPI Discussed the use of AI scribe software for clinical note transcription with the patient, who gave verbal consent to proceed.  History of Present Illness Debra Saunders is an 78 year old female with pulmonary hypertension who presents for a follow-up visit. She is accompanied by her daughter, Doyce.  She experiences diarrhea four to five times daily, described as very loose and watery, often leading to accidents. Carafate  significantly slows the diarrhea. Is established with [facility].   She has a history of anemia and  receives B12 injections every three to four months and iron  infusions every three months. No significant blood loss is noted, although she occasionally notices blood in her stool, believed to be due to hemorrhoids.  Her medication regimen includes Tylenol  for pain, Eliquis  twice daily, atropine  eye drops at night, and cefadroxil  twice daily due to a previously infected device that could not be removed. She also uses a Uteral inhaler as needed, although it has not been required recently. She takes Crestor  and sildenafil  as part of her treatment plan.  She experiences neuropathy symptoms, primarily in her legs and feet, and sometimes in her fingertips, for which she takes gabapentin  once daily.   She has a history of low vitamin D  levels and is on a high-strength vitamin D  supplement once a week. She has not noticed an increase in energy levels yet, as she has not been on the supplement for a full month.  Her sleep pattern is irregular, often going to bed late and waking up late, but she reports getting about eight hours of sleep. She has not started melatonin despite having it available.  She lives independently and is very active, cooking meals and participating in family activities. She has a medical alert system in place for emergencies.  ROS As per HPI    Objective:     BP 118/66 (BP Location: Right Arm, Patient Position: Sitting, Cuff Size: Normal)   Pulse 87   Temp 98.6 F (37 C) (Oral)   Ht 5' (1.524 m)   Wt 183 lb 6.4 oz (83.2 kg)   SpO2 96%   BMI 35.82 kg/m      12/29/2023    2:24 PM 10/06/2023   11:35 AM 09/23/2023    2:59 PM  Depression screen PHQ 2/9  Decreased Interest 0 0 0  Down, Depressed, Hopeless 0 0 0  PHQ - 2 Score 0 0 0  Altered sleeping 1 1  1  Tired, decreased energy 1 1 3   Change in appetite 0 1 3  Feeling bad or failure about yourself  0 0 0  Trouble concentrating 0 0 0  Moving slowly or fidgety/restless 0 0 0  Suicidal thoughts 0 0 0  PHQ-9 Score 2 3 7    Difficult doing work/chores Not difficult at all Not difficult at all Not difficult at all      12/29/2023    2:25 PM 09/23/2023    2:59 PM 09/02/2023    1:59 PM 03/02/2023    3:55 PM  GAD 7 : Generalized Anxiety Score  Nervous, Anxious, on Edge 0 0 0 0  Control/stop worrying 0 0 0 0  Worry too much - different things 0 0 0 0  Trouble relaxing 0 0 0 0  Restless 0 0 0 0  Easily annoyed or irritable 0 0 0 0  Afraid - awful might happen 0 0 0 0  Total GAD 7 Score 0 0 0 0  Anxiety Difficulty Not difficult at all  Not difficult at all Not difficult at all      12/29/2023    2:24 PM 10/06/2023   11:35 AM 09/23/2023    2:59 PM  Depression screen PHQ 2/9  Decreased Interest 0 0 0  Down, Depressed, Hopeless 0 0 0  PHQ - 2 Score 0 0 0  Altered sleeping 1 1 1   Tired, decreased energy 1 1 3   Change in appetite 0 1 3  Feeling bad or failure about yourself  0 0 0  Trouble concentrating 0 0 0  Moving slowly or fidgety/restless 0 0 0  Suicidal thoughts 0 0 0  PHQ-9 Score 2 3 7   Difficult doing work/chores Not difficult at all Not difficult at all Not difficult at all      12/29/2023    2:25 PM 09/23/2023    2:59 PM 09/02/2023    1:59 PM 03/02/2023    3:55 PM  GAD 7 : Generalized Anxiety Score  Nervous, Anxious, on Edge 0 0 0 0  Control/stop worrying 0 0 0 0  Worry too much - different things 0 0 0 0  Trouble relaxing 0 0 0 0  Restless 0 0 0 0  Easily annoyed or irritable 0 0 0 0  Afraid - awful might happen 0 0 0 0  Total GAD 7 Score 0 0 0 0  Anxiety Difficulty Not difficult at all  Not difficult at all Not difficult at all   SDOH Screenings   Food Insecurity: No Food Insecurity (10/06/2023)  Housing: Unknown (12/10/2023)   Received from [facility]  Transportation Needs: No Transportation Needs (10/06/2023)  Utilities: Not At Risk (10/06/2023)  Alcohol Screen: Low Risk  (07/30/2023)  Depression (PHQ2-9): Low Risk  (12/29/2023)  Financial Resource Strain: Low Risk  (07/30/2023)   Physical Activity: Inactive (07/30/2023)  Social Connections: Socially Isolated (09/29/2023)  Stress: No Stress Concern Present (07/30/2023)  Tobacco Use: Medium Risk (12/29/2023)  Health Literacy: Adequate Health Literacy (07/30/2023)     Physical Exam Constitutional:      Appearance: She is obese.  HENT:     Head: Normocephalic and atraumatic.     Mouth/Throat:     Mouth: Mucous membranes are moist.  Cardiovascular:     Rate and Rhythm: Normal rate.  Pulmonary:     Effort: Pulmonary effort is normal.     Breath sounds: Normal breath sounds.     Comments: Has nasal cannula  with 2L/min supplemental oxygen  Abdominal:     Palpations: Abdomen is soft.     Tenderness: There is no guarding.  Musculoskeletal:     Cervical back: Neck supple.     Right lower leg: No edema.     Left lower leg: Edema present.     Comments: Left lower leg non-pitting edema secondary to DVT on chronic anticoagulation  Skin:    General: Skin is warm.  Neurological:     Mental Status: She is alert and oriented to person, place, and time.        No results found for any visits on 12/29/23.  The ASCVD Risk score (Arnett DK, et al., 2019) failed to calculate for the following reasons:   The 2019 ASCVD risk score is only valid for ages 69 to 7   Risk score cannot be calculated because patient has a medical history suggesting prior/existing ASCVD     Assessment & Plan:   Anemia due to stage 3b chronic kidney disease (HCC) Assessment & Plan: Established with heme/onc, gets iron  infusion and B 12 IM injection every 3 months. Hb has been stable. Continue f/u with heme/onc.   Orders: -     AMB Referral VBCI Care Management  Vitamin D  deficiency Assessment & Plan: Only started taking vitamin D  about a month ago. Continue once weekly vitamin D  50000 units. Repeat vitamin D  level during f/u appointment in about 6 months.   Orders: -     Vitamin D  (Ergocalciferol ); Take 1 capsule (50,000 Units total) by  mouth every 7 (seven) days.  Dispense: 12 capsule; Refill: 3  Chronic diastolic CHF (congestive heart failure) (HCC) Assessment & Plan: Chronic issue. On supplemental oxygen . Established with cardiology. Referral to RN/social worked made to reduce risk of hospitalization, meet patient's need to optimize her health.    Orders: -     AMB Referral VBCI Care Management  B12 deficiency Assessment & Plan: Plan per anemia of chronic disease.    Chronic atrial fibrillation with RVR (HCC) Assessment & Plan: Established with cardiology, on 2L/min supplemental oxygen . Referral to RN/social worker made today. Management per cardiology.   Orders: -     AMB Referral VBCI Care Management  Chronic left-sided low back pain without sciatica Assessment & Plan: Stable with Gabapentin  600 mg,at bedtime. Refill sent    Neuropathy Assessment & Plan: Of lower legs, stable with Gabapentin  600 mg at night time. Refill sent after reviewing PDMP.    Chronic diarrhea Assessment & Plan: Chronic issue.  Established with [facility]. Believed to be multifactorial including current medications. Recommend continuing GI follow up, start taking daily multivitamin.    Other orders -     Gabapentin ; Take 1 tablet (600 mg total) by mouth at bedtime.  Dispense: 90 tablet; Refill: 1   I spent 35 minutes on the day of this face-to-face encounter reviewing the patient's medical, history, reviewing recent hospital visit, labs, reviewing current medications, discussing ongoing concerns, and reviewing the assessment and plan with the patient and her daughter.    Return in about 6 months (around 06/30/2024).   Luke Shade, MD

## 2023-12-29 NOTE — Assessment & Plan Note (Signed)
 Plan per anemia of chronic disease.

## 2023-12-29 NOTE — Assessment & Plan Note (Signed)
 Chronic issue.  Established with [facility]. Believed to be multifactorial including current medications. Recommend continuing GI follow up, start taking daily multivitamin.

## 2023-12-29 NOTE — Assessment & Plan Note (Signed)
 Chronic issue. On supplemental oxygen . Established with cardiology. Referral to RN/social worked made to reduce risk of hospitalization, meet patient's need to optimize her health.

## 2023-12-29 NOTE — Assessment & Plan Note (Signed)
 Established with cardiology, on 2L/min supplemental oxygen . Referral to RN/social worker made today. Management per cardiology.

## 2023-12-29 NOTE — Assessment & Plan Note (Signed)
 Established with heme/onc, gets iron  infusion and B 12 IM injection every 3 months. Hb has been stable. Continue f/u with heme/onc.

## 2023-12-29 NOTE — Patient Instructions (Signed)
 Take vitamin D  once a week. I have sent prescription for this.  Please talk to your cardiologist about starting a daily women's multi-vitamin.  I sent a refill on Gabapentin  600 mg, take it one tablet at night.

## 2023-12-29 NOTE — Assessment & Plan Note (Signed)
 Of lower legs, stable with Gabapentin  600 mg at night time. Refill sent after reviewing PDMP.

## 2023-12-29 NOTE — Assessment & Plan Note (Signed)
 Stable with Gabapentin  600 mg,at bedtime. Refill sent

## 2023-12-31 ENCOUNTER — Ambulatory Visit: Payer: Medicare HMO

## 2023-12-31 DIAGNOSIS — I495 Sick sinus syndrome: Secondary | ICD-10-CM | POA: Diagnosis not present

## 2024-01-04 ENCOUNTER — Ambulatory Visit: Payer: Self-pay | Admitting: Cardiology

## 2024-01-04 LAB — CUP PACEART REMOTE DEVICE CHECK
Battery Remaining Longevity: 145 mo
Battery Voltage: 3.12 V
Brady Statistic AP VP Percent: 98 %
Brady Statistic AP VS Percent: 0.06 %
Brady Statistic AS VP Percent: 0.84 %
Brady Statistic AS VS Percent: 1.1 %
Brady Statistic RA Percent Paced: 98.18 %
Brady Statistic RV Percent Paced: 98.83 %
Date Time Interrogation Session: 20250712201701
Implantable Lead Connection Status: 753985
Implantable Lead Connection Status: 753985
Implantable Lead Implant Date: 20150720
Implantable Lead Implant Date: 20150720
Implantable Lead Location: 753859
Implantable Lead Location: 753860
Implantable Lead Model: 5076
Implantable Lead Model: 5076
Implantable Pulse Generator Implant Date: 20241009
Lead Channel Impedance Value: 285 Ohm
Lead Channel Impedance Value: 323 Ohm
Lead Channel Impedance Value: 456 Ohm
Lead Channel Impedance Value: 494 Ohm
Lead Channel Pacing Threshold Amplitude: 0.5 V
Lead Channel Pacing Threshold Pulse Width: 0.4 ms
Lead Channel Sensing Intrinsic Amplitude: 1.375 mV
Lead Channel Sensing Intrinsic Amplitude: 1.375 mV
Lead Channel Sensing Intrinsic Amplitude: 11 mV
Lead Channel Sensing Intrinsic Amplitude: 11 mV
Lead Channel Setting Pacing Amplitude: 2 V
Lead Channel Setting Pacing Amplitude: 2 V
Lead Channel Setting Pacing Pulse Width: 0.4 ms
Lead Channel Setting Sensing Sensitivity: 0.9 mV
Zone Setting Status: 755011
Zone Setting Status: 755011

## 2024-01-06 ENCOUNTER — Other Ambulatory Visit (HOSPITAL_COMMUNITY): Payer: Self-pay | Admitting: Cardiology

## 2024-01-12 ENCOUNTER — Ambulatory Visit
Admission: RE | Admit: 2024-01-12 | Discharge: 2024-01-12 | Disposition: A | Discharge status: Home / Self Care | Source: Ambulatory Visit | Attending: Cardiology | Admitting: Cardiology

## 2024-01-12 DIAGNOSIS — I083 Combined rheumatic disorders of mitral, aortic and tricuspid valves: Secondary | ICD-10-CM | POA: Insufficient documentation

## 2024-01-12 DIAGNOSIS — I5032 Chronic diastolic (congestive) heart failure: Secondary | ICD-10-CM | POA: Diagnosis not present

## 2024-01-12 DIAGNOSIS — I2510 Atherosclerotic heart disease of native coronary artery without angina pectoris: Secondary | ICD-10-CM | POA: Diagnosis not present

## 2024-01-12 DIAGNOSIS — I4891 Unspecified atrial fibrillation: Secondary | ICD-10-CM | POA: Diagnosis not present

## 2024-01-12 LAB — ECHOCARDIOGRAM COMPLETE
AR max vel: 1.73 cm2
AV Area VTI: 1.81 cm2
AV Area mean vel: 1.58 cm2
AV Mean grad: 6.3 mmHg
AV Peak grad: 11 mmHg
Ao pk vel: 1.66 m/s
Area-P 1/2: 2.47 cm2
MV VTI: 1.39 cm2
S' Lateral: 3 cm

## 2024-01-12 NOTE — Progress Notes (Signed)
*  PRELIMINARY RESULTS* Echocardiogram 2D Echocardiogram has been performed.  Debra Saunders 01/12/2024, 10:51 AM

## 2024-01-13 ENCOUNTER — Ambulatory Visit (HOSPITAL_COMMUNITY): Payer: Self-pay | Admitting: Cardiology

## 2024-01-14 ENCOUNTER — Telehealth: Payer: Self-pay

## 2024-01-14 NOTE — Progress Notes (Signed)
 Complex Care Management Note  Care Guide Note 01/14/2024 Name: Debra Saunders MRN: [id number] DOB: [birth year]  Debra Saunders is a 78 y.o. year old female who sees Bair, Kalpana, MD for primary care. I reached out to Debra Saunders by phone today to offer complex care management services.  Debra Saunders was given information about Complex Care Management services today including:   The Complex Care Management services include support from the care team which includes your Nurse Care Manager, Clinical Social Worker, or Pharmacist.  The Complex Care Management team is here to help remove barriers to the health concerns and goals most important to you. Complex Care Management services are voluntary, and the patient may decline or stop services at any time by request to their care team member.   Complex Care Management Consent Status: Patient agreed to services and verbal consent obtained.   Follow up plan:  Telephone appointment with complex care management team member scheduled for:  01/15/24 & 01/25/24.   Encounter Outcome:  Patient Scheduled  [name]  [facility], [facility] Care Management Assistant Direct Dial: [phone]  Fax: [phone]

## 2024-01-14 NOTE — Telephone Encounter (Addendum)
 Spoke to daughter  about echocardiogram results  ----- Message from Ezra Shuck sent at 01/13/2024  4:26 PM EDT ----- Normal LV EF, normal RV function, moderate TR.  ----- Message ----- From: Interface, [employer] Sent: 01/12/2024   5:47 PM EDT To: Ezra GORMAN Shuck, MD

## 2024-01-15 ENCOUNTER — Telehealth: Payer: Self-pay

## 2024-01-18 ENCOUNTER — Inpatient Hospital Stay

## 2024-01-18 ENCOUNTER — Inpatient Hospital Stay (HOSPITAL_BASED_OUTPATIENT_CLINIC_OR_DEPARTMENT_OTHER): Admitting: Internal Medicine

## 2024-01-18 ENCOUNTER — Inpatient Hospital Stay: Attending: Internal Medicine

## 2024-01-18 ENCOUNTER — Encounter: Payer: Self-pay | Admitting: Internal Medicine

## 2024-01-18 VITALS — BP 133/71 | HR 52 | Temp 98.6°F | Resp 20 | Ht 60.0 in | Wt 183.3 lb

## 2024-01-18 DIAGNOSIS — D649 Anemia, unspecified: Secondary | ICD-10-CM

## 2024-01-18 DIAGNOSIS — Z7901 Long term (current) use of anticoagulants: Secondary | ICD-10-CM | POA: Insufficient documentation

## 2024-01-18 DIAGNOSIS — R0602 Shortness of breath: Secondary | ICD-10-CM | POA: Insufficient documentation

## 2024-01-18 DIAGNOSIS — N183 Chronic kidney disease, stage 3 unspecified: Secondary | ICD-10-CM | POA: Insufficient documentation

## 2024-01-18 DIAGNOSIS — Z950 Presence of cardiac pacemaker: Secondary | ICD-10-CM | POA: Insufficient documentation

## 2024-01-18 DIAGNOSIS — I4891 Unspecified atrial fibrillation: Secondary | ICD-10-CM | POA: Insufficient documentation

## 2024-01-18 DIAGNOSIS — E611 Iron deficiency: Secondary | ICD-10-CM

## 2024-01-18 DIAGNOSIS — K921 Melena: Secondary | ICD-10-CM | POA: Diagnosis not present

## 2024-01-18 DIAGNOSIS — E538 Deficiency of other specified B group vitamins: Secondary | ICD-10-CM

## 2024-01-18 DIAGNOSIS — R5382 Chronic fatigue, unspecified: Secondary | ICD-10-CM | POA: Diagnosis not present

## 2024-01-18 DIAGNOSIS — R197 Diarrhea, unspecified: Secondary | ICD-10-CM | POA: Insufficient documentation

## 2024-01-18 DIAGNOSIS — K909 Intestinal malabsorption, unspecified: Secondary | ICD-10-CM | POA: Insufficient documentation

## 2024-01-18 DIAGNOSIS — Z87891 Personal history of nicotine dependence: Secondary | ICD-10-CM | POA: Insufficient documentation

## 2024-01-18 DIAGNOSIS — D631 Anemia in chronic kidney disease: Secondary | ICD-10-CM | POA: Insufficient documentation

## 2024-01-18 DIAGNOSIS — Z86718 Personal history of other venous thrombosis and embolism: Secondary | ICD-10-CM | POA: Diagnosis not present

## 2024-01-18 LAB — BASIC METABOLIC PANEL - CANCER CENTER ONLY
Anion gap: 7 (ref 5–15)
BUN: 36 mg/dL — ABNORMAL HIGH (ref 8–23)
CO2: 26 mmol/L (ref 22–32)
Calcium: 9.3 mg/dL (ref 8.9–10.3)
Chloride: 104 mmol/L (ref 98–111)
Creatinine: 1.29 mg/dL — ABNORMAL HIGH (ref 0.44–1.00)
GFR, Estimated: 42 mL/min — ABNORMAL LOW (ref >60)
Glucose, Bld: 100 mg/dL — ABNORMAL HIGH (ref 70–99)
Potassium: 4.5 mmol/L (ref 3.5–5.1)
Sodium: 137 mmol/L (ref 135–145)

## 2024-01-18 LAB — CBC WITH DIFFERENTIAL (CANCER CENTER ONLY)
Abs Immature Granulocytes: 0.01 K/uL (ref 0.00–0.07)
Basophils Absolute: 0 K/uL (ref 0.0–0.1)
Basophils Relative: 0 %
Eosinophils Absolute: 0.2 K/uL (ref 0.0–0.5)
Eosinophils Relative: 3 %
HCT: 32.4 % — ABNORMAL LOW (ref 36.0–46.0)
Hemoglobin: 9.9 g/dL — ABNORMAL LOW (ref 12.0–15.0)
Immature Granulocytes: 0 %
Lymphocytes Relative: 24 %
Lymphs Abs: 1.2 K/uL (ref 0.7–4.0)
MCH: 28.3 pg (ref 26.0–34.0)
MCHC: 30.6 g/dL (ref 30.0–36.0)
MCV: 92.6 fL (ref 80.0–100.0)
Monocytes Absolute: 0.3 K/uL (ref 0.1–1.0)
Monocytes Relative: 7 %
Neutro Abs: 3.4 K/uL (ref 1.7–7.7)
Neutrophils Relative %: 66 %
Platelet Count: 181 K/uL (ref 150–400)
RBC: 3.5 MIL/uL — ABNORMAL LOW (ref 3.87–5.11)
RDW: 12.5 % (ref 11.5–15.5)
WBC Count: 5.1 K/uL (ref 4.0–10.5)
nRBC: 0 % (ref 0.0–0.2)

## 2024-01-18 LAB — VITAMIN B12: Vitamin B-12: 321 pg/mL (ref 180–914)

## 2024-01-18 MED ORDER — CYANOCOBALAMIN 1000 MCG/ML IJ SOLN
1000.0000 ug | Freq: Once | INTRAMUSCULAR | Status: AC
  Administered 2024-01-18: 1000 ug via INTRAMUSCULAR
  Filled 2024-01-18: qty 1

## 2024-01-18 MED ORDER — EPOETIN ALFA 20000 UNIT/ML IJ SOLN
20000.0000 [IU] | Freq: Once | INTRAMUSCULAR | Status: AC
  Administered 2024-01-18: 20000 [IU] via SUBCUTANEOUS
  Filled 2024-01-18: qty 1

## 2024-01-18 NOTE — Progress Notes (Signed)
 Patient has no concerns

## 2024-01-18 NOTE — Progress Notes (Signed)
 [facility] CONSULT NOTE  Patient Care Team: Bair, Kalpana, MD as PCP - General (Family Medicine) Darron Deatrice LABOR, MD as PCP - Cardiology (Cardiology) Fernande Elspeth BROCKS, MD as PCP - Electrophysiology (Cardiology) Lenord Sensing, MD as Referring Physician (Allergy) Alaine Vicenta NOVAK, MD as Referring Physician (Internal Medicine) Rennie Cindy SAUNDERS, MD as Consulting Physician (Oncology) Anemia CHIEF COMPLAINTS/PURPOSE OF CONSULTATION: Anemia   HEMATOLOGY HISTORY  # CHORNIC IRON  DEFICIENCY ANEMIA [BL 10-11]-Jan today 21 PCP Hb-8.8; [Iron  sat-5%; ferritin 11] EGD-; colonoscopy MARCH 2020 -polyp [[facility]-hold further work-up secondary to cardiac issues]; remote-EGD-?;  February 2021-LDH/haptoglobin normal; kappa lambda light chain ratio/M panel normal;  on Venofer ; November 2020-CT scan question early cirrhosis/no splenomegaly.  No acute process; MARCH 2021- STOOL OCCULT x3- NEGATIVE.   #B12 deficiency-IM every 3 to 4 months  #2014 -question blood transfusion  [patient admitted for stroke]; CHF chronic -[ 2004 on 4 lits; Dr.Arida/Klein]; CT scan chest 2021 -no adenopathy; fibrotic changes; walker; CKD stage III-   #Incidental right axillary mass-s/p US  biopsy [PCP] -negative for malignancy.  HISTORY OF PRESENTING ILLNESS: Patient in wheelchair on [place] O2.  Alone.  Debra Saunders 78 y.o.  female multiple comorbidities including A-fib on anticoagulation, COPD/chronic congestive failure on oxygen  and anemia/likely secondary to ?CKD stage III -as needed IV iron  is here for follow-up.   In the interim patient had a cardioversion.  She continues to be on Eliquis .  Noted to have intermittent blood in stools.also notes to have chronic diarrhea.   Chronic fatigue chronic shortness of breath not any worse.  Patient denies any nausea vomiting.  Patient is noncompliant with her oral iron  as she complains of abdominal discomfort.  Review of Systems  Constitutional:  Positive for  malaise/fatigue. Negative for chills, diaphoresis, fever and weight loss.  HENT:  Negative for nosebleeds and sore throat.   Eyes:  Negative for double vision.  Respiratory:  Positive for shortness of breath. Negative for cough, hemoptysis, sputum production and wheezing.   Cardiovascular:  Negative for chest pain, palpitations, orthopnea and leg swelling.  Gastrointestinal:  Negative for abdominal pain, blood in stool, constipation, heartburn, melena, nausea and vomiting.  Genitourinary:  Negative for dysuria, frequency and urgency.  Musculoskeletal:  Negative for back pain and joint pain.  Skin: Negative.  Negative for itching and rash.  Neurological:  Negative for dizziness, tingling, focal weakness, weakness and headaches.  Endo/Heme/Allergies:  Does not bruise/bleed easily.  Psychiatric/Behavioral:  Negative for depression. The patient is not nervous/anxious and does not have insomnia.     MEDICAL HISTORY:  Past Medical History:  Diagnosis Date   Abdominal pain 07/06/2021   Acute hypokalemia 07/18/2023   Acute on chronic heart failure with preserved ejection fraction (HFpEF) (HCC) 07/17/2023   Arthritis    Atrial fibrillation, persistent (HCC)    a. s/p TEE-guided DCCV on 08/20/2017; b. 06/2021 Recurrent Afib-->amio/TEE/DCCV.   CAD (coronary artery disease)    a. 01/2012 Cath: nonobs dzs; b. 04/2014 MV: No ischemia; c. 09/2017 Cath: Minimal nonobs RCA dzs, otw nl cors. 09/2023: Tikosyn  loading   Cardiorenal syndrome    a. 09/2017 Creat rose to 2.7 w/ diuresis-->HD x 2.   Chronic back pain    Chronic combined systolic (congestive) and diastolic (congestive) heart failure (HCC)    a. Dx 2005 @ [facility];  b. 02/2014 Echo: EF 55-60%; c. 07/2017: EF 30-35%; d. 09/2017 Echo: EF 40-45%; e. 02/2018 Echo: EF 55-60%; f. 08/2019 Echo: EF 60-65%; g. 08/2020 RHC: RA 3, RV  30/1, PA 33/11 (20), PCWP 6; f. 08/2021 Echo: EF 60-65%, no rwma, GrIDD, nl RV fxn, RVSP 40.36mmHg, mod dil LA, mod TR.   Chronic respiratory  failure (HCC)    a. on home O2   Gastritis    History of COVID-19    History of endocarditis 08/22/2021   History of intracranial hemorrhage    a. 6/14 described by neurosurgery as small frontal posttraumatic SAH  - pt was on xarelto  @ the time.   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 Chronic LLE edema.   NICM (nonischemic cardiomyopathy) (HCC)    a. 09/2017 Echo: EF 40-45%; b. 09/2017 Cath: minor, insignificant CAD; c. 02/2018 Echo: EF 55-60%, Gr1 DD; d. 08/2019 Echo: EF 60-65%, Gr2 DD.   Non-ST elevation (NSTEMI) myocardial infarction (HCC) 07/18/2023   Obesity    PAH (pulmonary artery hypertension) (HCC)    a. 09/2017 Echo: PASP ; b. 09/2017 RHC: RA 23, RV 84/13, PA 84/29, PCWP 20, CO 3.89, CI 1.89. LVEDP 18; c. 08/2020 RHC: RA 3, RV 30/1, PA 33/11 (20), PCWP 6. CO/CI 6.18/3.36. PVR 2.3 WU.   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 06/2021. Veg on PPM lead. Not candidate for extraction-->s/p 6 wks abx.   Sepsis with metabolic encephalopathy (HCC)    Sleep apnea    Streptococcal bacteremia    Stroke [facility]) 2014   Subarachnoid hemorrhage (HCC) 01/19/2013   Symptomatic bradycardia    a. s/p BSX PPM.   Thyroid  nodule    Type II diabetes mellitus (HCC)    Urinary incontinence     SURGICAL HISTORY: Past Surgical History:  Procedure Laterality Date   ABDOMINAL HYSTERECTOMY  1969   CARDIAC CATHETERIZATION  2013   @ [facility]: No obstructive CAD: Only 20% ostial left circumflex.   CARDIOVERSION N/A 10/12/2017   Procedure: CARDIOVERSION;  Surgeon: Perla Evalene PARAS, MD;  Location: [facility] ORS;  Service: Cardiovascular;  Laterality: N/A;   CARDIOVERSION N/A 07/04/2021   Procedure: CARDIOVERSION;  Surgeon: Rolan Ezra RAMAN, MD;  Location: [facility] ENDOSCOPY;  Service: Cardiovascular;  Laterality: N/A;    CARDIOVERSION N/A 03/27/2023   Procedure: CARDIOVERSION;  Surgeon: Rolan Ezra RAMAN, MD;  Location: [facility] ORS;  Service: Cardiovascular;  Laterality: N/A;   CARDIOVERSION N/A 10/01/2023   Procedure: CARDIOVERSION;  Surgeon: Rolan Ezra RAMAN, MD;  Location: [facility] INVASIVE CV LAB;  Service: Cardiovascular;  Laterality: N/A;   CATARACT EXTRACTION W/PHACO Right 09/19/2020   Procedure: CATARACT EXTRACTION PHACO AND INTRAOCULAR LENS PLACEMENT (IOC) RIGHT DIABETIC 3.95 00:43.0 9.2%;  Surgeon: Mittie Gaskin, MD;  Location: [facility] SURGERY CNTR;  Service: Ophthalmology;  Laterality: Right;  Pt requests to be last sleep apnea   CATARACT EXTRACTION W/PHACO Left 10/03/2020   Procedure: CATARACT EXTRACTION PHACO AND INTRAOCULAR LENS PLACEMENT (IOC) LEFT DIABETIC;  Surgeon: Mittie Gaskin, MD;  Location: [facility] SURGERY CNTR;  Service: Ophthalmology;  Laterality: Left;  3.25 0:59.3 5.5%   CHOLECYSTECTOMY     COLONOSCOPY WITH PROPOFOL  N/A 09/10/2018   Procedure: COLONOSCOPY WITH PROPOFOL ;  Surgeon: Viktoria Lamar DASEN, MD;  Location: [facility] ENDOSCOPY;  Service: Endoscopy;  Laterality: N/A;   COLONOSCOPY WITH PROPOFOL  N/A 10/22/2022   Procedure: COLONOSCOPY WITH PROPOFOL ;  Surgeon: Onita Elspeth Sharper, DO;  Location: [facility] ENDOSCOPY;  Service: Gastroenterology;  Laterality: N/A;   CYST REMOVAL  TRUNK  2002   BACK   DIALYSIS/PERMA CATHETER INSERTION N/A 10/06/2017   Procedure: DIALYSIS/PERMA CATHETER INSERTION;  Surgeon: Jama Cordella MATSU, MD;  Location: [facility] INVASIVE CV LAB;  Service: Cardiovascular;  Laterality: N/A;   ESOPHAGOGASTRODUODENOSCOPY (EGD) WITH PROPOFOL  N/A 10/21/2022   Procedure: ESOPHAGOGASTRODUODENOSCOPY (EGD) WITH PROPOFOL ;  Surgeon: Onita Elspeth Sharper, DO;  Location: [facility] ENDOSCOPY;  Service: Gastroenterology;  Laterality: N/A;   GIVENS CAPSULE STUDY N/A 10/22/2022   Procedure: GIVENS CAPSULE STUDY;  Surgeon: Onita Elspeth Sharper, DO;  Location: [facility] ENDOSCOPY;  Service:  Gastroenterology;  Laterality: N/A;  IF NOTHING IS FOUND WITH COLONOSCOPY   INSERT / REPLACE / REMOVE PACEMAKER     lipoma removal  2014   back   PACEMAKER INSERTION  2015   [employer] dual chamber pacemaker implanted by Dr Fernande for symptomatic bradycardia   PERMANENT PACEMAKER INSERTION N/A 01/09/2014   Procedure: PERMANENT PACEMAKER INSERTION;  Surgeon: Elspeth JAYSON Fernande, MD;  Location: [facility] CATH LAB;  Service: Cardiovascular;  Laterality: N/A;   PPM GENERATOR CHANGEOUT N/A 04/01/2023   Procedure: PPM GENERATOR CHANGEOUT;  Surgeon: Fernande Elspeth JAYSON, MD;  Location: [facility] INVASIVE CV LAB;  Service: Cardiovascular;  Laterality: N/A;   RIGHT HEART CATH N/A 04/19/2018   Procedure: RIGHT HEART CATH;  Surgeon: Rolan Ezra RAMAN, MD;  Location: [facility] INVASIVE CV LAB;  Service: Cardiovascular;  Laterality: N/A;   RIGHT HEART CATH N/A 08/30/2020   Procedure: RIGHT HEART CATH;  Surgeon: Rolan Ezra RAMAN, MD;  Location: [facility] INVASIVE CV LAB;  Service: Cardiovascular;  Laterality: N/A;   RIGHT HEART CATH N/A 02/11/2022   Procedure: RIGHT HEART CATH;  Surgeon: Rolan Ezra RAMAN, MD;  Location: [facility] INVASIVE CV LAB;  Service: Cardiovascular;  Laterality: N/A;   RIGHT/LEFT HEART CATH AND CORONARY ANGIOGRAPHY N/A 10/19/2017   Procedure: RIGHT/LEFT HEART CATH AND CORONARY ANGIOGRAPHY;  Surgeon: Darron Deatrice LABOR, MD;  Location: [facility] INVASIVE CV LAB;  Service: Cardiovascular;  Laterality: N/A;   RIGHT/LEFT HEART CATH AND CORONARY ANGIOGRAPHY N/A 07/20/2023   Procedure: RIGHT/LEFT HEART CATH AND CORONARY ANGIOGRAPHY;  Surgeon: Darron Deatrice LABOR, MD;  Location: [facility] INVASIVE CV LAB;  Service: Cardiovascular;  Laterality: N/A;   TEE WITHOUT CARDIOVERSION N/A 08/20/2017   Procedure: TRANSESOPHAGEAL ECHOCARDIOGRAM (TEE) & Direct current cardioversion;  Surgeon: Perla Evalene PARAS, MD;  Location: [facility] ORS;  Service: Cardiovascular;  Laterality: N/A;   TEE WITHOUT CARDIOVERSION N/A 06/28/2021   Procedure: TRANSESOPHAGEAL  ECHOCARDIOGRAM (TEE);  Surgeon: Perla Evalene PARAS, MD;  Location: [facility] ORS;  Service: Cardiovascular;  Laterality: N/A;   TEE WITHOUT CARDIOVERSION N/A 07/04/2021   Procedure: TRANSESOPHAGEAL ECHOCARDIOGRAM (TEE);  Surgeon: Rolan Ezra RAMAN, MD;  Location: [facility] ENDOSCOPY;  Service: Cardiovascular;  Laterality: N/A;   TRIGGER FINGER RELEASE      SOCIAL HISTORY: Social History   Socioeconomic History   Marital status: Divorced    Spouse name: Not on file   Number of children: 4   Years of education: 12   Highest education level: Associate degree: occupational, Scientist, product/process development, or vocational program  Occupational History   Occupation: Retired- factory work    Comment: exposed to Interior and spatial designer  Tobacco Use   Smoking status: Former    Current packs/day: 0.00    Average packs/day: 0.3 packs/day for 2.0 years (0.6 ttl pk-yrs)    Types: Cigarettes    Start date: 06/23/1988    Quit date: 06/23/1990    Years since quitting: 33.5    Passive exposure: Past   Smokeless tobacco: Never  Tobacco comments:    Former smoker 10/08/23  Vaping Use   Vaping status: Never Used  Substance and Sexual Activity   Alcohol use: No   Drug use: No   Sexual activity: Not Currently    Birth control/protection: Surgical  Other Topics Concern   Not on file  Social History Narrative   Lives in [place] alone. Has 4 children.  Daughter lives 8 minutes away. No pets.      Work - Hotel manager, retired      Diet - regular      Remote hx of smoking > 30 years; No alcohol-    Social Drivers of Corporate investment banker Strain: Low Risk  (07/30/2023)   Overall Financial Resource Strain (CARDIA)    Difficulty of Paying Living Expenses: Not hard at all  Food Insecurity: No Food Insecurity (10/06/2023)   Hunger Vital Sign    Worried About Running Out of Food in the Last Year: Never true    Ran Out of Food in the Last Year: Never true  Transportation Needs: Unmet Transportation Needs (01/18/2024)   PRAPARE -  Transportation    Lack of Transportation (Medical): Yes    Lack of Transportation (Non-Medical): Yes  Physical Activity: Inactive (07/30/2023)   Exercise Vital Sign    Days of Exercise per Week: 0 days    Minutes of Exercise per Session: 0 min  Stress: No Stress Concern Present (07/30/2023)   [employer] of Occupational Health - Occupational Stress Questionnaire    Feeling of Stress : Not at all  Social Connections: Socially Isolated (09/29/2023)   Social Connection and Isolation Panel    Frequency of Communication with Friends and Family: More than three times a week    Frequency of Social Gatherings with Friends and Family: Once a week    Attends Religious Services: Never    Database administrator or Organizations: No    Attends Banker Meetings: Never    Marital Status: Widowed  Intimate Partner Violence: Not At Risk (10/06/2023)   Humiliation, Afraid, Rape, and Kick questionnaire    Fear of Current or Ex-Partner: No    Emotionally Abused: No    Physically Abused: No    Sexually Abused: No    FAMILY HISTORY: Family History  Problem Relation Age of Onset   Heart disease Mother    Hypertension Mother    COPD Mother        was a smoker   Thyroid  disease Mother    Hypertension Father    Hypertension Sister    Thyroid  disease Sister    Breast cancer Sister    Hypertension Sister    Thyroid  disease Sister    Bone cancer Sister    Stroke Son    Breast cancer Maternal Aunt    Kidney disease Neg Hx    Bladder Cancer Neg Hx     ALLERGIES:  is allergic to aspirin  and other.  MEDICATIONS:  Current Outpatient Medications  Medication Sig Dispense Refill   acetaminophen  (TYLENOL ) 325 MG tablet Take 325-650 mg by mouth every 6 (six) hours as needed for mild pain.     albuterol  (ACCUNEB ) 0.63 MG/3ML nebulizer solution Take 1 ampule by nebulization every 6 (six) hours as needed for wheezing.     apixaban  (ELIQUIS ) 5 MG TABS tablet Take 1 tablet (5 mg total) by mouth  2 (two) times daily. RESUME SUNDAY pm 180 tablet 3   atropine  1 % ophthalmic solution Place 1 drop  into both eyes at bedtime.     cefadroxil  (DURICEF) 500 MG capsule Take 1 capsule (500 mg total) by mouth 2 (two) times daily. 60 capsule 12   dapagliflozin  propanediol (FARXIGA ) 10 MG TABS tablet Take 1 tablet (10 mg total) by mouth daily before breakfast. 90 tablet 1   dofetilide  (TIKOSYN ) 250 MCG capsule Take 1 capsule (250 mcg total) by mouth 2 (two) times daily. 180 capsule 2   gabapentin  (NEURONTIN ) 600 MG tablet Take 1 tablet (600 mg total) by mouth at bedtime. 90 tablet 1   macitentan  (OPSUMIT ) 10 MG tablet Take 1 tablet (10 mg total) by mouth daily. 30 tablet 11   meclizine  (ANTIVERT ) 25 MG tablet Take 1 tablet (25 mg total) by mouth 3 (three) times daily as needed for dizziness. 30 tablet 1   Menthol , Topical Analgesic, (BIOFREEZE) 10 % CREA Apply 1 Application topically daily as needed (apply to back for pain).     midodrine  (PROAMATINE ) 5 MG tablet Take 1 tablet (5 mg total) by mouth 3 (three) times daily with meals. 90 tablet 5   OXYGEN  Inhale 4 L/min into the lungs continuous.     potassium chloride  SA (KLOR-CON  M) 20 MEQ tablet Take 40meq every morning by mouth and 40meq every evening.     prednisoLONE  acetate (PRED FORTE ) 1 % ophthalmic suspension Place 1 drop into both eyes 2 (two) times daily.     rosuvastatin  (CRESTOR ) 10 MG tablet Take 1 tablet (10 mg total) by mouth daily. 90 tablet 3   sildenafil  (REVATIO ) 20 MG tablet Take 1 tablet (20 mg total) by mouth 3 (three) times daily. 270 tablet 3   spironolactone  (ALDACTONE ) 25 MG tablet Take 1 tablet by mouth daily.     torsemide  (DEMADEX ) 20 MG tablet Take 3 tablets (60 mg total) by mouth daily. 200 tablet 3   UPTRAVI  1600 MCG TABS Take 1 tablet twice daily. 60 tablet 11   Vitamin D , Ergocalciferol , (DRISDOL ) 1.25 MG (50000 UNIT) CAPS capsule Take 1 capsule (50,000 Units total) by mouth every 7 (seven) days. 12 capsule 3   Current  Facility-Administered Medications  Medication Dose Route Frequency Provider Last Rate Last Admin   diclofenac  Sodium (VOLTAREN ) 1 % topical gel 2 g  2 g Topical QID           PHYSICAL EXAMINATION:   Vitals:   01/18/24 1331  BP: 133/71  Pulse: (!) 52  Resp: 20  Temp: 98.6 F (37 C)  SpO2: 100%     Filed Weights   01/18/24 1331  Weight: 183 lb 4.8 oz (83.1 kg)      Physical Exam Constitutional:      Comments: Patient in wheelchair.  Alone.  She is on O2 nasal cannula.  HENT:     Head: Normocephalic and atraumatic.     Mouth/Throat:     Pharynx: No oropharyngeal exudate.  Eyes:     Pupils: Pupils are equal, round, and reactive to light.  Cardiovascular:     Rate and Rhythm: Normal rate and regular rhythm.  Pulmonary:     Effort: No respiratory distress.     Breath sounds: No wheezing.     Comments: Decreased air entry bilaterally.  No wheeze or crackles. Abdominal:     General: Bowel sounds are normal. There is no distension.     Palpations: Abdomen is soft. There is no mass.     Tenderness: There is no abdominal tenderness. There is no guarding or rebound.  Musculoskeletal:  General: No tenderness. Normal range of motion.     Cervical back: Normal range of motion and neck supple.  Skin:    General: Skin is warm.  Neurological:     Mental Status: She is alert and oriented to person, place, and time.  Psychiatric:        Mood and Affect: Affect normal.     LABORATORY DATA:  I have reviewed the data as listed Lab Results  Component Value Date   WBC 5.1 01/18/2024   HGB 9.9 (L) 01/18/2024   HCT 32.4 (L) 01/18/2024   MCV 92.6 01/18/2024   PLT 181 01/18/2024   Recent Labs    09/29/23 1433 09/30/23 0425 10/08/23 1556 10/19/23 1255 11/11/23 1407 01/18/24 1329  NA  --    < > 141 139 141 137  K  --    < > 3.5 3.9 4.7 4.5  CL  --    < > 105 103 105 104  CO2  --    < > 27 26 21 26   GLUCOSE  --    < > 91 107* 83 100*  BUN  --    < > 19 25* 25  36*  CREATININE  --    < > 0.94 1.22* 1.07* 1.29*  CALCIUM   --    < > 8.9 8.9 9.4 9.3  GFRNONAA  --    < > >60 45*  --  42*  PROT 6.8  --   --   --   --   --   ALBUMIN  3.8  --   --   --   --   --   AST 13*  --   --   --   --   --   ALT 9  --   --   --   --   --   ALKPHOS 83  --   --   --   --   --   BILITOT 0.6  --   --   --   --   --   BILIDIR 0.1  --   --   --   --   --   IBILI 0.5  --   --   --   --   --    < > = values in this interval not displayed.     ECHOCARDIOGRAM COMPLETE Result Date: 01/12/2024    ECHOCARDIOGRAM REPORT   Patient Name:   Debra Saunders Date of Exam: 01/12/2024 Medical Rec #:  [id number]        Height:       60.0 in Accession #:    [id number]       Weight:       183.4 lb Date of Birth:  11/18/1945         BSA:          1.799 m Patient Age:    78 years         BP:           118/66 mmHg Patient Gender: F                HR:           87 bpm. Exam Location:  [facility] Procedure: 2D Echo, 3D Echo, Cardiac Doppler, Color Doppler and Strain Analysis            (Both Spectral and Color Flow Doppler were utilized during  procedure). Indications:     Chronic diastolic congestive heart failure I50.32                  Congestive heart failure I50.9  History:         Patient has prior history of Echocardiogram examinations, most                  recent 06/27/2021. CAD; Arrythmias:Atrial Fibrillation.  Sonographer:     Christopher Furnace Referring Phys:  [id number] EZRA GORMAN SHUCK Diagnosing Phys: Lonni Hanson MD  Sonographer Comments: Global longitudinal strain was attempted. IMPRESSIONS  1. Left ventricular ejection fraction, by estimation, is 55 to 60%. Left ventricular ejection fraction by 3D volume is 57 %. The left ventricle has normal function. The left ventricle has no regional wall motion abnormalities. There is mild left ventricular hypertrophy. Left ventricular diastolic parameters are consistent with Grade II diastolic dysfunction (pseudonormalization). The average left ventricular  global longitudinal strain is -12.7 %. The global longitudinal strain is abnormal.  2. Right ventricular systolic function is normal. The right ventricular size is normal. There is mildly elevated pulmonary artery systolic pressure.  3. The mitral valve is normal in structure. Mild mitral valve regurgitation. No evidence of mitral stenosis.  4. Tricuspid valve regurgitation is moderate.  5. The aortic valve is tricuspid. There is moderate calcification of the aortic valve. There is moderate thickening of the aortic valve. Aortic valve regurgitation is not visualized. Aortic valve sclerosis/calcification is present, without any evidence of aortic stenosis.  6. The inferior vena cava is normal in size with greater than 50% respiratory variability, suggesting right atrial pressure of 3 mmHg. FINDINGS  Left Ventricle: Left ventricular ejection fraction, by estimation, is 55 to 60%. Left ventricular ejection fraction by 3D volume is 57 %. The left ventricle has normal function. The left ventricle has no regional wall motion abnormalities. The average left ventricular global longitudinal strain is -12.7 %. Strain was performed and the global longitudinal strain is abnormal. The left ventricular internal cavity size was normal in size. There is mild left ventricular hypertrophy. Abnormal (paradoxical) septal motion, consistent with RV pacemaker. Left ventricular diastolic parameters are consistent with Grade II diastolic dysfunction (pseudonormalization). Right Ventricle: The right ventricular size is normal. No increase in right ventricular wall thickness. Right ventricular systolic function is normal. There is mildly elevated pulmonary artery systolic pressure. The tricuspid regurgitant velocity is 2.92  m/s, and with an assumed right atrial pressure of 3 mmHg, the estimated right ventricular systolic pressure is 37.1 mmHg. Left Atrium: Left atrial size was normal in size. Right Atrium: Right atrial size was normal in  size. Pericardium: There is no evidence of pericardial effusion. Mitral Valve: The mitral valve is normal in structure. Mild mitral valve regurgitation. No evidence of mitral valve stenosis. MV peak gradient, 5.2 mmHg. The mean mitral valve gradient is 2.0 mmHg. Tricuspid Valve: The tricuspid valve is normal in structure. Tricuspid valve regurgitation is moderate. Aortic Valve: The aortic valve is tricuspid. There is moderate calcification of the aortic valve. There is moderate thickening of the aortic valve. Aortic valve regurgitation is not visualized. Aortic valve sclerosis/calcification is present, without any  evidence of aortic stenosis. Aortic valve mean gradient measures 6.3 mmHg. Aortic valve peak gradient measures 11.0 mmHg. Aortic valve area, by VTI measures 1.81 cm. Pulmonic Valve: The pulmonic valve was not well visualized. Pulmonic valve regurgitation is not visualized. No evidence of pulmonic stenosis. Aorta: The aortic root is normal in size and  structure. Pulmonary Artery: The pulmonary artery is not well seen. Venous: The inferior vena cava is normal in size with greater than 50% respiratory variability, suggesting right atrial pressure of 3 mmHg. IAS/Shunts: The interatrial septum was not well visualized. Additional Comments: 3D was performed not requiring image post processing on an independent workstation and was normal. A device lead is visualized in the right ventricle.  LEFT VENTRICLE PLAX 2D LVIDd:         4.80 cm         Diastology LVIDs:         3.00 cm         LV e' medial:    5.22 cm/s LV PW:         1.20 cm         LV E/e' medial:  13.7 LV IVS:        1.10 cm         LV e' lateral:   12.30 cm/s LVOT diam:     2.00 cm         LV E/e' lateral: 5.8 LV SV:         56 LV SV Index:   31              2D Longitudinal LVOT Area:     3.14 cm        Strain                                2D Strain GLS   -12.7 %                                Avg:                                 3D Volume EF                                 LV 3D EF:    Left                                             ventricul                                             ar                                             ejection                                             fraction  by 3D                                             volume is                                             57 %.                                 3D Volume EF:                                3D EF:        57 % RIGHT VENTRICLE RV Basal diam:  3.50 cm RV Mid diam:    2.80 cm LEFT ATRIUM             Index        RIGHT ATRIUM           Index LA diam:        3.40 cm 1.89 cm/m   RA Area:     13.10 cm LA Vol (A2C):   46.1 ml 25.62 ml/m  RA Volume:   24.60 ml  13.67 ml/m LA Vol (A4C):   44.4 ml 24.68 ml/m LA Biplane Vol: 46.2 ml 25.68 ml/m  AORTIC VALVE AV Area (Vmax):    1.73 cm AV Area (Vmean):   1.58 cm AV Area (VTI):     1.81 cm AV Vmax:           165.67 cm/s AV Vmean:          117.000 cm/s AV VTI:            0.311 m AV Peak Grad:      11.0 mmHg AV Mean Grad:      6.3 mmHg LVOT Vmax:         91.00 cm/s LVOT Vmean:        58.800 cm/s LVOT VTI:          0.179 m LVOT/AV VTI ratio: 0.57  AORTA Ao Root diam: 2.80 cm MITRAL VALVE               TRICUSPID VALVE MV Area (PHT): 2.47 cm    TR Peak grad:   34.1 mmHg MV Area VTI:   1.39 cm    TR Vmax:        292.00 cm/s MV Peak grad:  5.2 mmHg MV Mean grad:  2.0 mmHg    SHUNTS MV Vmax:       1.14 m/s    Systemic VTI:  0.18 m MV Vmean:      57.6 cm/s   Systemic Diam: 2.00 cm MV Decel Time: 307 msec MV E velocity: 71.30 cm/s MV A velocity: 76.50 cm/s MV E/A ratio:  0.93 Lonni End MD Electronically signed by Lonni Hanson MD Signature Date/Time: 01/12/2024/5:47:06 PM    Final    CUP PACEART REMOTE DEVICE CHECK Result Date: 01/04/2024 PPM Scheduled remote reviewed. Normal device function.  Presenting rhythm:  AP/VP Next remote 91 days. LA, CVRS    Symptomatic anemia # Chronic  Anemia [BL10-11]-iron  deficiency/? CKD stage III  -  not on gentle iron  [sec to dyspepsia]  venofer  /Retacrit  prn-   # Today hemoglobin is 9.9.  Proceed with Retacrit  hold on infusion today.  # chronic  intermittent blood in stools - chronic diarrhea s/p [facility]-GI evaluation-defer to GI.  # Etiology of anemia-unclear- CKD stage III  vs Others-[Dr.Lateef]/  S/p EGD/ capsule MAY 2024- NEG [hospital]; [facility] [Dr.Russo]- MAY 2024 - NEG. [chronic diarrhea/malabsorption]-monitor closely.  If anemia is worse consider bone marrow biopsy  # Mild Hypokalemia- today- 4.5- continue Kdur BID;- stable.   # CAD/History of DVT/s/p IVC filter; ?  A. Fib-Eliquis - s/p cardio-conversion [Dr.Klein/Dr.Arida] - monitor closely on Eliquis - if increased bleeding pt will inform cardiology-   # Borderline low B12-OCT 2023- 230; s/p B12 injection.; proceed with injection q 2- 4 months.    # DISPOSITION: # HOLD venofer - proceed with Retacrit -  b12 today # Follow up in 3 months-MD -  labs-- cbc;bmp; iron  studies; ferritin- b12 levels;possible Venofer  OR retacrit ;  B12 injection- Dr.B  Cc; Rosaline Louder     All questions were answered. The patient knows to call the clinic with any problems, questions or concerns.    Cindy JONELLE Joe, MD 01/18/2024 2:49 PM

## 2024-01-18 NOTE — Assessment & Plan Note (Addendum)
#   Chronic Anemia [BL10-11]-iron  deficiency/? CKD stage III  - not on gentle iron  [sec to dyspepsia]  venofer  /Retacrit  prn-   # Today hemoglobin is 9.9.  Proceed with Retacrit  hold on infusion today.  # chronic  intermittent blood in stools - chronic diarrhea s/p [facility]-GI evaluation-defer to GI.  # Etiology of anemia-unclear- CKD stage III  vs Others-[Dr.Lateef]/  S/p EGD/ capsule MAY 2024- NEG [hospital]; [place] [Dr.Russo]- MAY 2024 - NEG. [chronic diarrhea/malabsorption]-monitor closely.  If anemia is worse consider bone marrow biopsy  # Mild Hypokalemia- today- 4.5- continue Kdur BID;- stable.   # CAD/History of DVT/s/p IVC filter; ?  A. Fib-Eliquis - s/p cardio-conversion [Dr.Klein/Dr.Arida] - monitor closely on Eliquis - if increased bleeding pt will inform cardiology-   # Borderline low B12-OCT 2023- 230; s/p B12 injection.; proceed with injection q 2- 4 months.    # DISPOSITION: # HOLD venofer - proceed with Retacrit -  b12 today # Follow up in 3 months-MD -  labs-- cbc;bmp; iron  studies; ferritin- b12 levels;possible Venofer  OR retacrit ;  B12 injection- Dr.B  Cc; Rosaline Louder

## 2024-01-19 NOTE — Procedures (Signed)
 Complete Pulmonary Function Test  Referring Physician Parris Manna, MD  Reason for PFT  ILD (interstitial lung disease) (CMS/HHS-HCC)   SPIROMETRY: FVC was 1.54 L, 82 % of predicted FEV1 was 1.20 L, 83 % of predicted FEV1/FVC ratio was  100 % of predicted FEF 25-75% liters per second was 84 % of predicted  LUNG VOLUMES: TLC was 86 % of predicted RV was 109 % of predicted  DIFFUSION CAPACITY: DLCO was 40 % of predicted DLCO/VA was 60 % of predicted   Good patient effort with good repeatability. Patient on oxygen  for testing.   Interpretation:   Interpreting Physician Dr. Parris   **See physician progress note for interpretation

## 2024-01-20 ENCOUNTER — Other Ambulatory Visit: Payer: Self-pay

## 2024-01-20 NOTE — Progress Notes (Signed)
 This encounter was created in error - please disregard.

## 2024-01-20 NOTE — Patient Outreach (Signed)
 LCSW spoke with patient to discuss the reason for the referral. Patient stated that she was in need of a dentist that takes her insurance. LCSW asked if patient is had any other needs such as SDOH, mental health or medical needs . Patient states she does not think that she has any other needs at this time.Patient states that she does not think she needs complex care management services at this time. Patient has RN appointment scheduled for 01/25/2024 to continue to assess.LCSW scheduled a follow up visit 01/28/2024 at 3:00 PM in order to provide resources.

## 2024-01-25 ENCOUNTER — Other Ambulatory Visit: Payer: Self-pay

## 2024-01-25 NOTE — Patient Instructions (Signed)
 Visit Information  Thank you for taking time to visit with me today. Please don't hesitate to contact me if I can be of assistance to you before our next scheduled appointment.  Your next appointment is by telephone on 02/08/24 at 2:30 PM with Hadassah Duos Please call the care guide team at [phone] if you need to cancel or reschedule your appointment.   Following is a copy of your care plan:   Goals Addressed             This Visit's Progress    VBCI RN Care Plan   On track    Problems:  Chronic Disease Management support and education needs related to Atrial Fibrillation, CHF, and anemia  Goal: Over the next 30 days the Patient will attend all scheduled medical appointments: cardiology as evidenced by completed visit notes uploaded to EMR        continue to work with RN Care Manager and/or Social Worker to address care management and care coordination needs related to Atrial Fibrillation, CHF, and anemia as evidenced by adherence to care management team scheduled appointments     demonstrate Ongoing adherence to prescribed treatment plan for Atrial Fibrillation, CHF, and anemia as evidenced by attending all provider appointments and continuing to weigh daily, monitoring for symptoms related to fluid retention and Atril Fibrillation take all medications exactly as prescribed and will call provider for medication related questions as evidenced by patient report of medication adherence     Interventions:   AFIB Interventions:   Reviewed importance of adherence to anticoagulant exactly as prescribed Screening for signs and symptoms of depression related to chronic disease state  Assessed social determinant of health barriers   Anemia/Bleeding Interventions:  Assessment of understanding of anemia/bleeding disorder diagnosis  Basic overview and discussion of anemia/bleeding disorder or acute disease state  Medications reviewed  recommended promotion of rest and energy-conserving  measures to manage fatigue, such as balancing activity with periods of rest encouraged strategies to prevent falls related to fatigue, weakness and dizziness; encouraged sitting before standing and using an assistive device Lab Results  Component Value Date   WBC 5.1 01/18/2024   HGB 9.9 (L) 01/18/2024   HCT 32.4 (L) 01/18/2024   MCV 92.6 01/18/2024   PLT 181 01/18/2024     Heart Failure Interventions: Assessed need for readable accurate scales in home Advised patient to weigh each morning after emptying bladder Discussed importance of daily weight and advised patient to weigh and record daily Reviewed role of diuretics in prevention of fluid overload and management of heart failure; Discussed the importance of keeping all appointments with provider  Patient Self-Care Activities:  Attend all scheduled provider appointments Call provider office for new concerns or questions  Perform all self care activities independently  Take medications as prescribed   call office if I gain more than 2 pounds in one day or 5 pounds in one week use salt in moderation watch for swelling in feet, ankles and legs every day weigh myself daily begin a symptom diary take medicine as prescribed  Plan:  Telephone follow up appointment with care management team member scheduled for:  02/08/24 at 2:30 PM with Hadassah Duos             Please call the Suicide and Crisis Lifeline: 988 call 1-800-273-TALK (toll free, 24 hour hotline) if you are experiencing a Mental Health or Behavioral Health Crisis or need someone to talk to.  Patient verbalizes understanding of instructions and care plan  provided today and agrees to view in MyChart. Active MyChart status and patient understanding of how to access instructions and care plan via MyChart confirmed with patient.     Rosaline Finlay, RN MSN [place]  VBCI Population Health RN Care Manager Direct Dial: [phone]  Fax: [phone]

## 2024-01-25 NOTE — Patient Outreach (Signed)
 Complex Care Management   Visit Note  01/25/2024  Name:  Debra Saunders MRN: [id number] DOB: 1945/11/12  Situation: Referral received for Complex Care Management related to Heart Failure, Atrial Fibrillation, and anemia I obtained verbal consent from Patient.  Visit completed with Berwyn Riding  on the phone  Background:   Past Medical History:  Diagnosis Date   Abdominal pain 07/06/2021   Acute hypokalemia 07/18/2023   Acute on chronic heart failure with preserved ejection fraction (HFpEF) (HCC) 07/17/2023   Arthritis    Atrial fibrillation, persistent (HCC)    a. s/p TEE-guided DCCV on 08/20/2017; b. 06/2021 Recurrent Afib-->amio/TEE/DCCV.   CAD (coronary artery disease)    a. 01/2012 Cath: nonobs dzs; b. 04/2014 MV: No ischemia; c. 09/2017 Cath: Minimal nonobs RCA dzs, otw nl cors. 09/2023: Tikosyn  loading   Cardiorenal syndrome    a. 09/2017 Creat rose to 2.7 w/ diuresis-->HD x 2.   Chronic back pain    Chronic combined systolic (congestive) and diastolic (congestive) heart failure (HCC)    a. Dx 2005 @ [facility];  b. 02/2014 Echo: EF 55-60%; c. 07/2017: EF 30-35%; d. 09/2017 Echo: EF 40-45%; e. 02/2018 Echo: EF 55-60%; f. 08/2019 Echo: EF 60-65%; g. 08/2020 RHC: RA 3, RV 30/1, PA 33/11 (20), PCWP 6; f. 08/2021 Echo: EF 60-65%, no rwma, GrIDD, nl RV fxn, RVSP 40.23mmHg, mod dil LA, mod TR.   Chronic respiratory failure (HCC)    a. on home O2   Gastritis    History of COVID-19    History of endocarditis 08/22/2021   History of intracranial hemorrhage    a. 6/14 described by neurosurgery as small frontal posttraumatic SAH  - pt was on xarelto  @ the time.   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 Chronic LLE edema.   NICM (nonischemic cardiomyopathy) (HCC)    a. 09/2017 Echo: EF 40-45%; b. 09/2017 Cath: minor, insignificant CAD; c. 02/2018 Echo: EF 55-60%, Gr1 DD; d.  08/2019 Echo: EF 60-65%, Gr2 DD.   Non-ST elevation (NSTEMI) myocardial infarction (HCC) 07/18/2023   Obesity    PAH (pulmonary artery hypertension) (HCC)    a. 09/2017 Echo: PASP ; b. 09/2017 RHC: RA 23, RV 84/13, PA 84/29, PCWP 20, CO 3.89, CI 1.89. LVEDP 18; c. 08/2020 RHC: RA 3, RV 30/1, PA 33/11 (20), PCWP 6. CO/CI 6.18/3.36. PVR 2.3 WU.   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 06/2021. Veg on PPM lead. Not candidate for extraction-->s/p 6 wks abx.   Sepsis with metabolic encephalopathy (HCC)    Sleep apnea    Streptococcal bacteremia    Stroke [facility]) 2014   Subarachnoid hemorrhage (HCC) 01/19/2013   Symptomatic bradycardia    a. s/p BSX PPM.   Thyroid  nodule    Type II diabetes mellitus (HCC)    Urinary incontinence     Assessment: Patient Reported Symptoms:  Cognitive Cognitive Status: Able to follow simple commands, Alert and oriented to person, place, and time, Normal speech and language skills Cognitive/Intellectual Conditions Management [RPT]: None reported or documented in medical history or problem list      Neurological Neurological Review of Symptoms: Dizziness Neurological Management Strategies: Medication therapy, Routine screening  HEENT HEENT Symptoms Reported: Mouth or teeth pain HEENT Comment: Patient reports social work is looking into  dentist that accepts Medicare. She reports she has had issues locating a dentist that accepts her insurance.    Cardiovascular Cardiovascular Symptoms Reported: No symptoms reported Does patient have uncontrolled Hypertension?: No (Patient reports her BP cuff broke, does not have a new one) Cardiovascular Management Strategies: Medication therapy, Routine screening, Weight management Do You Have a Working Readable Scale?: Yes Weight: 192 lb (87.1 kg) (This morning per patient) Cardiovascular Comment: Patient had cardioversion 10/01/23. She reports she is able to tell  when she is in A Fib. She denies recent symptoms related to A Fib. Has regular follow-up with cardiology. Note that patient also gets iron  infusion and B12 injections on a scheduled basis.  Respiratory Respiratory Symptoms Reported: No symptoms reported Respiratory Management Strategies: CPAP, Oxygen  therapy (4 L O2 via [place] supplied by [employer])  Endocrine Endocrine Symptoms Reported: No symptoms reported Is patient diabetic?: Yes Is patient checking blood sugars at home?: No    Gastrointestinal Gastrointestinal Symptoms Reported: Diarrhea Additional Gastrointestinal Details: Patient reports an increased appetite over the past couple of months. She does feel like she has gained weight. Patient reports issues with diarrhea for over a year, which she thinks is attributed to medication side effects. She typically has 6-8 BMs a day. She wears a brief in case of incontinence. Gastrointestinal Management Strategies: Coping strategies, Incontinence garment/pad    Genitourinary Genitourinary Symptoms Reported: No symptoms reported    Integumentary Integumentary Symptoms Reported: No symptoms reported    Musculoskeletal Musculoskelatal Symptoms Reviewed: No symptoms reported Musculoskeletal Management Strategies: Medical device Falls in the past year?: No Number of falls in past year: 1 or less Was there an injury with Fall?: No Fall Risk Category Calculator: 0 Patient Fall Risk Level: Low Fall Risk Patient at Risk for Falls Due to: No Fall Risks Fall risk Follow up: Falls evaluation completed, Education provided, Falls prevention discussed  Psychosocial Psychosocial Symptoms Reported: Other Other Psychosocial Conditions: Patient reports difficulty staying asleep throughout the night. She does go to bed around the same time each night (10 PM). She sleeps for about 6 hours, but does not feel like she is getting deep, restful sleep. Reviewed sleep hygeine habits.     Quality of Family  Relationships: helpful, involved, supportive Do you feel physically threatened by others?: No      01/25/2024    2:07 PM  Depression screen PHQ 2/9  Decreased Interest 0  Down, Depressed, Hopeless 0  PHQ - 2 Score 0    There were no vitals filed for this visit.  Medications Reviewed Today     Reviewed by Arno Rosaline SQUIBB, RN (Registered Nurse) on 01/25/24 at 1402  Med List Status: <None>   Medication Order Taking? Sig Documenting Provider Last Dose Status Informant  acetaminophen  (TYLENOL ) 325 MG tablet [id number] Yes Take 325-650 mg by mouth every 6 (six) hours as needed for mild pain. [provider]  Active Self           Med Note EFRAIM, TYELISHA L   Tue Sep 29, 2023  2:49 PM)    albuterol  (ACCUNEB ) 0.63 MG/3ML nebulizer solution [id number] Yes Take 1 ampule by nebulization every 6 (six) hours as needed for wheezing. [provider]  Active Self  apixaban  (ELIQUIS ) 5 MG TABS tablet [id number] Yes Take 1 tablet (5 mg total) by mouth 2 (two) times daily. RESUME SUNDAY pm Fernande Elspeth BROCKS, MD  Active Self  atropine  1 % ophthalmic solution [id number] Yes Place 1 drop into both eyes at  bedtime. [provider]  Active Self  cefadroxil  (DURICEF) 500 MG capsule [id number] Yes Take 1 capsule (500 mg total) by mouth 2 (two) times daily. Fayette Bodily, MD  Active Self  dapagliflozin  propanediol (FARXIGA ) 10 MG TABS tablet [id number] Yes Take 1 tablet (10 mg total) by mouth daily before breakfast. Riddle, Suzann, NP  Active   diclofenac  Sodium (VOLTAREN ) 1 % topical gel 2 g [id number]   Bair, Kalpana, MD  Active   dofetilide  (TIKOSYN ) 250 MCG capsule [id number] Yes Take 1 capsule (250 mcg total) by mouth 2 (two) times daily. Riddle, Suzann, NP  Active   gabapentin  (NEURONTIN ) 600 MG tablet [id number] Yes Take 1 tablet (600 mg total) by mouth at bedtime. Bair, Kalpana, MD  Active   macitentan  (OPSUMIT ) 10 MG tablet [id number] Yes Take 1 tablet (10 mg total) by mouth  daily. Rolan Ezra RAMAN, MD  Active Self  meclizine  (ANTIVERT ) 25 MG tablet [id number] Yes Take 1 tablet (25 mg total) by mouth 3 (three) times daily as needed for dizziness. Rolan Ezra RAMAN, MD  Active Self  Menthol , Topical Analgesic, (BIOFREEZE) 10 % CREA [id number] Yes Apply 1 Application topically daily as needed (apply to back for pain). [provider]  Active   midodrine  (PROAMATINE ) 5 MG tablet [id number] Yes Take 1 tablet (5 mg total) by mouth 3 (three) times daily with meals. Zenaida Morene PARAS, MD  Active   OXYGEN  [id number] Yes Inhale 4 L/min into the lungs continuous. [provider]  Active   potassium chloride  SA (KLOR-CON  M) 20 MEQ tablet [id number] Yes Take 40meq every morning by mouth and 40meq every evening. Fenton, Clint R, PA  Active   prednisoLONE  acetate (PRED FORTE ) 1 % ophthalmic suspension [id number] Yes Place 1 drop into both eyes 2 (two) times daily. [provider]  Active   rosuvastatin  (CRESTOR ) 10 MG tablet [id number] Yes Take 1 tablet (10 mg total) by mouth daily. Rolan Ezra RAMAN, MD  Active   sildenafil  (REVATIO ) 20 MG tablet [id number] Yes Take 1 tablet (20 mg total) by mouth 3 (three) times daily. Rolan Ezra RAMAN, MD  Active   spironolactone  (ALDACTONE ) 25 MG tablet [id number] Yes Take 1 tablet by mouth daily. [provider]  Active Self  torsemide  (DEMADEX ) 20 MG tablet [id number] Yes Take 3 tablets (60 mg total) by mouth daily. Rolan Ezra RAMAN, MD  Active   UPTRAVI  1600 MCG TABS [id number] Yes Take 1 tablet twice daily. Rolan Ezra RAMAN, MD  Active   Vitamin D , Ergocalciferol , (DRISDOL ) 1.25 MG (50000 UNIT) CAPS capsule [id number] Yes Take 1 capsule (50,000 Units total) by mouth every 7 (seven) days. Abbey Bruckner, MD  Active             Recommendation:   Continue Current Plan of Care  Follow Up Plan:   Telephone follow up appointment date/time:  02/08/24 at 2:30 PM with Hadassah Devra Rosaline Arno, RN MSN [place]   [facility] RN Care Manager Direct Dial: [phone]  Fax: [phone]

## 2024-01-28 ENCOUNTER — Other Ambulatory Visit: Payer: Self-pay

## 2024-02-02 ENCOUNTER — Other Ambulatory Visit: Payer: Self-pay

## 2024-02-02 NOTE — Patient Outreach (Signed)
 Complex Care Management   Visit Note  02/02/2024  Name:  Debra Saunders MRN: [id number] DOB: October 04, 1945  Situation: Referral received for Complex Care Management related to SDOH Barriers:  Dental I obtained verbal consent from Caregiver.  Visit completed with [facility].  on the phone  Background:   Past Medical History:  Diagnosis Date   Abdominal pain 07/06/2021   Acute hypokalemia 07/18/2023   Acute on chronic heart failure with preserved ejection fraction (HFpEF) (HCC) 07/17/2023   Arthritis    Atrial fibrillation, persistent (HCC)    a. s/p TEE-guided DCCV on 08/20/2017; b. 06/2021 Recurrent Afib-->amio/TEE/DCCV.   CAD (coronary artery disease)    a. 01/2012 Cath: nonobs dzs; b. 04/2014 MV: No ischemia; c. 09/2017 Cath: Minimal nonobs RCA dzs, otw nl cors. 09/2023: Tikosyn  loading   Cardiorenal syndrome    a. 09/2017 Creat rose to 2.7 w/ diuresis-->HD x 2.   Chronic back pain    Chronic combined systolic (congestive) and diastolic (congestive) heart failure (HCC)    a. Dx 2005 @ [facility];  b. 02/2014 Echo: EF 55-60%; c. 07/2017: EF 30-35%; d. 09/2017 Echo: EF 40-45%; e. 02/2018 Echo: EF 55-60%; f. 08/2019 Echo: EF 60-65%; g. 08/2020 RHC: RA 3, RV 30/1, PA 33/11 (20), PCWP 6; f. 08/2021 Echo: EF 60-65%, no rwma, GrIDD, nl RV fxn, RVSP 40.24mmHg, mod dil LA, mod TR.   Chronic respiratory failure (HCC)    a. on home O2   Gastritis    History of COVID-19    History of endocarditis 08/22/2021   History of intracranial hemorrhage    a. 6/14 described by neurosurgery as small frontal posttraumatic SAH  - pt was on xarelto  @ the time.   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 Chronic LLE edema.   NICM (nonischemic cardiomyopathy) (HCC)    a. 09/2017 Echo: EF 40-45%; b. 09/2017 Cath: minor, insignificant CAD; c. 02/2018 Echo: EF 55-60%, Gr1 DD; d. 08/2019 Echo: EF 60-65%,  Gr2 DD.   Non-ST elevation (NSTEMI) myocardial infarction (HCC) 07/18/2023   Obesity    PAH (pulmonary artery hypertension) (HCC)    a. 09/2017 Echo: PASP ; b. 09/2017 RHC: RA 23, RV 84/13, PA 84/29, PCWP 20, CO 3.89, CI 1.89. LVEDP 18; c. 08/2020 RHC: RA 3, RV 30/1, PA 33/11 (20), PCWP 6. CO/CI 6.18/3.36. PVR 2.3 WU.   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 06/2021. Veg on PPM lead. Not candidate for extraction-->s/p 6 wks abx.   Sepsis with metabolic encephalopathy (HCC)    Sleep apnea    Streptococcal bacteremia    Stroke [facility]) 2014   Subarachnoid hemorrhage (HCC) 01/19/2013   Symptomatic bradycardia    a. s/p BSX PPM.   Thyroid  nodule    Type II diabetes mellitus (HCC)    Urinary incontinence     Assessment:  Patients daughter reports difficulty obtaining a dentist for patient.  Patients daughter is provided several dentist from [employer] that are in network. Daughter is also emailed the link to view all dentist that accept [employer].  Patients daughter will follow up with dentist for an appointment.    SDOH Interventions    Flowsheet Row Patient Outreach Telephone from 01/25/2024 in [place] POPULATION HEALTH DEPARTMENT Office Visit from 01/18/2024 in [facility]  [place] [facility] Of [place] Telephone from 10/06/2023 in [place] POPULATION HEALTH DEPARTMENT Patient Outreach from 08/06/2023 in [place] POPULATION HEALTH DEPARTMENT Patient Outreach from 07/30/2023 in [place] POPULATION HEALTH DEPARTMENT Telephone from 07/22/2023 in [place] POPULATION HEALTH DEPARTMENT  SDOH Interventions        Food Insecurity Interventions Intervention Not Indicated -- Intervention Not Indicated Intervention Not Indicated Intervention Not Indicated Intervention Not Indicated  Housing Interventions Intervention Not Indicated -- Intervention Not Indicated Intervention Not Indicated Intervention Not  Indicated Intervention Not Indicated  Transportation Interventions Intervention Not Indicated [facility] [place] [facility] Only) Intervention Not Indicated Intervention Not Indicated Intervention Not Indicated Intervention Not Indicated  [daughter Dedra provides all transportation]  Utilities Interventions Intervention Not Indicated -- Intervention Not Indicated Intervention Not Indicated Intervention Not Indicated Intervention Not Indicated  Alcohol Usage Interventions -- -- -- -- Intervention Not Indicated (Score <7) --  Financial Strain Interventions -- -- -- -- Intervention Not Indicated --  Physical Activity Interventions -- -- -- -- Intervention Not Indicated --  Stress Interventions -- -- -- -- Intervention Not Indicated --  Social Connections Interventions -- -- -- -- Intervention Not Indicated --  Health Literacy Interventions -- -- -- -- Intervention Not Indicated --      Recommendation:   None  Follow Up Plan:   Telephone follow up appointment date/time:  02/10/24 at 2:30pm  Tillman Gardener, BSW [place]  [facility], [facility] Social Worker Direct Dial: [phone]  Fax: [phone] Website: [email or web address]

## 2024-02-02 NOTE — Patient Instructions (Signed)
 Visit Information  Thank you for taking time to visit with me today. Please don't hesitate to contact me if I can be of assistance to you before our next scheduled appointment.  Our next appointment is by telephone on 02/10/24 at 2:30pm Please call the care guide team at [phone] if you need to cancel or reschedule your appointment.   Following is a copy of your care plan:   Goals Addressed             This Visit's Progress    BSW VBCI Social Work Care Plan       Problems:   Patient needs assistance with connecting with dental providers covered by [facility].  CSW Clinical Goal(s):   Over the next 7 days the Caregiver will will follow up with dental providers as directed by Social Work.  Interventions:  Social Determinants of Health in Patient with CKD Stage 3, DMII, and HTN: SDOH assessments completed: Dental Evaluation of current treatment plan related to unmet needs Patients daughter is provided several dentist from [employer] that are in network.  Daughter is also emailed the link to view all dentist that accept [employer].    Patient Goals/Self-Care Activities:  Patients daughter will follow up with dentist for an appointment.  Plan:   Telephone follow up appointment with care management team member scheduled for:  02/10/24 at 2:30pm        Please call 911 if you are experiencing a Mental Health or Behavioral Health Crisis or need someone to talk to.  Patient verbalizes understanding of instructions and care plan provided today and agrees to view in MyChart. Active MyChart status and patient understanding of how to access instructions and care plan via MyChart confirmed with patient.     Tillman Gardener, BSW [place]  [facility], [facility] Social Worker Direct Dial: [phone]  Fax: [phone] Website: [email or web address]

## 2024-02-03 ENCOUNTER — Other Ambulatory Visit: Payer: Self-pay

## 2024-02-03 NOTE — Patient Outreach (Signed)
 LCSW called and spoke with patient's daughter Debra Saunders in regard to the follow up appointment today. LCSW had spoken to Debra Saunders previously to let her know that Debra Saunders would be assisting with finding a dentist. Debra Saunders confirmed that the information had been given. Patient had identified that as a care need. LCSW explained that patient did not identify any more needs for LCSW. LCSW informed Debra Saunders that Debra Saunders would be following up again of 8/20/025. LCSW ensured that Debra Saunders had LCSW contact information if any other needs arise.

## 2024-02-04 ENCOUNTER — Other Ambulatory Visit: Payer: Self-pay | Admitting: Cardiology

## 2024-02-08 ENCOUNTER — Other Ambulatory Visit: Payer: Self-pay

## 2024-02-08 NOTE — Progress Notes (Signed)
 Take torsemide  80 mg daily x 3 days then back to 60 mg daily.  Needs appointment with APP.

## 2024-02-08 NOTE — Patient Instructions (Signed)
 Visit Information  Thank you for taking time to visit with me today. Please don't hesitate to contact me if I can be of assistance to you before our next scheduled appointment.  Your next care management appointment is by telephone on 02/09/2024 at 3:30 pm  Telephone follow-up tomorrow Please call Heart Failure Clinic with any questions regarding medications and symptoms as discussed.   Please call the care guide team at [phone] if you need to cancel, schedule, or reschedule an appointment.   Please call the Suicide and Crisis Lifeline: 988 call the [place]  National Suicide Prevention Lifeline: 573-437-1155 or TTY: (217)073-8144 TTY 618-228-1803) to talk to a trained counselor call 1-800-273-TALK (toll free, 24 hour hotline) go to [facility] [address], [place] [phone]) call 911 if you are experiencing a Mental Health or Behavioral Health Crisis or need someone to talk to.  [name], MSN, RN [place]  [facility], [facility] Health RN Care Manager Direct Dial: [phone] Fax: [phone]  Heart Failure Action Plan A heart failure action plan helps you know what to do when you have symptoms of heart failure. Your action plan is a color-coded plan that lists the symptoms to watch for and indicates what actions to take. If you have symptoms in the green zone, you're doing well. If you have symptoms in the yellow zone, you're having problems. If you have symptoms in the red zone, you need medical care right away. Follow the plan that was created by you and your health care provider. Review your plan each time you visit your provider. Green zone These signs mean you're doing well and can continue what you're doing: You don't have new or worsening shortness of breath. You have very little swelling or no new swelling. Your weight is stable (no gain or loss). You have a normal activity level. You don't have  chest pain or any other new symptoms. Yellow zone These signs and symptoms mean your condition may be getting worse and you should make some changes: You have trouble breathing when you're active. You have swelling in your feet or legs or have discomfort in your belly. You gain 2-3 lb (0.9-1.4 kg) in 24 hours, or 5 lb (2.3 kg) in a week. This amount may be more or less depending on your condition. You get tired easily. You have trouble sleeping. You have a dry cough. If you have any of these symptoms: Contact your provider within the next day. Your provider may adjust your medicines. Red zone These signs and symptoms mean you should get medical help right away: You have trouble breathing when resting or cannot lie flat and you need to raise your head to help you breathe. You have a dry cough that's getting worse. You have swelling or pain in your feet or legs or discomfort in your belly that's getting worse. You suddenly gain more than 2-3 lb (0.9-1.4 kg) in 24 hours, or more than 5 lb (2.3 kg) in a week. This amount may be more or less depending on your condition. You have trouble staying awake or you feel confused. You don't have an appetite. You have worsening sadness or depression. These symptoms may be an emergency. Call 911 right away. Do not wait to see if the symptoms will go away. Do not drive yourself to the hospital. Follow these instructions at home: Take medicines only as told. Eat a heart-healthy diet. Work with a dietitian to create an eating plan that's best for  you. Weigh yourself each day. Your target weight is __________ lb (__________ kg). Call your provider if you gain more than __________ lb (__________ kg) in 24 hours, or more than __________ lb (__________ kg) in a week. Health care provider name: _____________________________________________________ Health care provider phone number: _____________________________________________________ Where to find more  information American Heart Association: heart.org This information is not intended to replace advice given to you by your health care provider. Make sure you discuss any questions you have with your health care provider. Document Revised: 01/22/2023 Document Reviewed: 01/22/2023 Elsevier Patient Education  2024 [employer].

## 2024-02-08 NOTE — Patient Outreach (Signed)
 Complex Care Management   Visit Note  02/08/2024  Name:  Debra Saunders MRN: [id number] DOB: May 06, 1946  Situation: Referral received for Complex Care Management related to Heart Failure I obtained verbal consent from Patient.  Visit completed with patient and spoke with daughter  on the phone  Background:   Past Medical History:  Diagnosis Date   Abdominal pain 07/06/2021   Acute hypokalemia 07/18/2023   Acute on chronic heart failure with preserved ejection fraction (HFpEF) (HCC) 07/17/2023   Arthritis    Atrial fibrillation, persistent (HCC)    a. s/p TEE-guided DCCV on 08/20/2017; b. 06/2021 Recurrent Afib-->amio/TEE/DCCV.   CAD (coronary artery disease)    a. 01/2012 Cath: nonobs dzs; b. 04/2014 MV: No ischemia; c. 09/2017 Cath: Minimal nonobs RCA dzs, otw nl cors. 09/2023: Tikosyn  loading   Cardiorenal syndrome    a. 09/2017 Creat rose to 2.7 w/ diuresis-->HD x 2.   Chronic back pain    Chronic combined systolic (congestive) and diastolic (congestive) heart failure (HCC)    a. Dx 2005 @ [facility];  b. 02/2014 Echo: EF 55-60%; c. 07/2017: EF 30-35%; d. 09/2017 Echo: EF 40-45%; e. 02/2018 Echo: EF 55-60%; f. 08/2019 Echo: EF 60-65%; g. 08/2020 RHC: RA 3, RV 30/1, PA 33/11 (20), PCWP 6; f. 08/2021 Echo: EF 60-65%, no rwma, GrIDD, nl RV fxn, RVSP 40.69mmHg, mod dil LA, mod TR.   Chronic respiratory failure (HCC)    a. on home O2   Gastritis    History of COVID-19    History of endocarditis 08/22/2021   History of intracranial hemorrhage    a. 6/14 described by neurosurgery as small frontal posttraumatic SAH  - pt was on xarelto  @ the time.   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 Chronic LLE edema.   NICM (nonischemic cardiomyopathy) (HCC)    a. 09/2017 Echo: EF 40-45%; b. 09/2017 Cath: minor, insignificant CAD; c. 02/2018 Echo: EF 55-60%, Gr1 DD; d. 08/2019 Echo: EF  60-65%, Gr2 DD.   Non-ST elevation (NSTEMI) myocardial infarction (HCC) 07/18/2023   Obesity    PAH (pulmonary artery hypertension) (HCC)    a. 09/2017 Echo: PASP ; b. 09/2017 RHC: RA 23, RV 84/13, PA 84/29, PCWP 20, CO 3.89, CI 1.89. LVEDP 18; c. 08/2020 RHC: RA 3, RV 30/1, PA 33/11 (20), PCWP 6. CO/CI 6.18/3.36. PVR 2.3 WU.   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 06/2021. Veg on PPM lead. Not candidate for extraction-->s/p 6 wks abx.   Sepsis with metabolic encephalopathy (HCC)    Sleep apnea    Streptococcal bacteremia    Stroke [facility]) 2014   Subarachnoid hemorrhage (HCC) 01/19/2013   Symptomatic bradycardia    a. s/p BSX PPM.   Thyroid  nodule    Type II diabetes mellitus (HCC)    Urinary incontinence     Assessment: Patient Reported Symptoms:  Cognitive Cognitive Status: Alert and oriented to person, place, and time, Normal speech and language skills, Insightful and able to interpret abstract concepts, Poor judgment in daily scenarios (weight gain - aware when she needs to take extra med but did not and did not call HF Clinic) Cognitive/Intellectual Conditions Management [RPT]: None reported or documented in medical history or problem list   Health Maintenance Behaviors: Annual physical exam Healing Pattern:  Unsure  Neurological Neurological Review of Symptoms: Headaches Neurological Management Strategies: Routine screening, Medication therapy Neurological Comment: occasional dizziness, has headache now - not checking BP lately but does not feel BP is high  HEENT HEENT Symptoms Reported: Not assessed      Cardiovascular Cardiovascular Symptoms Reported: Swelling in legs or feet, Fatigue Does patient have uncontrolled Hypertension?: No Cardiovascular Management Strategies: Routine screening, Medication therapy Do You Have a Working Readable Scale?: Yes Weight: 195 lb (88.5 kg) Cardiovascular Comment: Patient reports  fatigue, SOB unchanged, weight gain 8 lb since Friday, has not taken extra torsemide  since already taking 60 mg and unsure, able to states when she should take extra and had HF Clinic number, Conference called and left message for HF clinic and discussed going to ED with patient and daughter - declined, prefer to wait for call back - RNCM called patient at 4:15 , no call back from HF Clinic said usually closer to 5 pm, patient reports she did take an extra torsemide , no worsening symptoms and agreed to follow up call tomorrow afternoon, red flags reviewed and agreed would go to ED prn  Respiratory Respiratory Symptoms Reported: Shortness of breath Other Respiratory Symptoms: Wears O2 4L, SOB, worse with exertion  states PO2 99 per watch Respiratory Management Strategies: CPAP, Oxygen  therapy, Routine screening, Medication therapy  Endocrine Endocrine Symptoms Reported: No symptoms reported Is patient diabetic?: Yes Is patient checking blood sugars at home?: No    Gastrointestinal Gastrointestinal Symptoms Reported: Diarrhea Additional Gastrointestinal Details: reports unchanged Gastrointestinal Management Strategies: Incontinence garment/pad    Genitourinary Genitourinary Symptoms Reported: No symptoms reported    Integumentary Integumentary Symptoms Reported: No symptoms reported    Musculoskeletal Musculoskelatal Symptoms Reviewed: No symptoms reported   Falls in the past year?: No Number of falls in past year: 1 or less Was there an injury with Fall?: No Fall Risk Category Calculator: 0 Patient Fall Risk Level: Low Fall Risk Patient at Risk for Falls Due to: Impaired balance/gait, Impaired mobility, Medication side effect Fall risk Follow up: Falls evaluation completed, Falls prevention discussed  Psychosocial Psychosocial Symptoms Reported: No symptoms reported     Quality of Family Relationships: helpful, involved, supportive      01/25/2024    2:07 PM  Depression screen PHQ 2/9   Decreased Interest 0  Down, Depressed, Hopeless 0  PHQ - 2 Score 0    There were no vitals filed for this visit.  Medications Reviewed Today     Reviewed by Devra Lands, RN (Registered Nurse) on 02/08/24 at 1613  Med List Status: <None>   Medication Order Taking? Sig Documenting Provider Last Dose Status Informant  acetaminophen  (TYLENOL ) 325 MG tablet [id number] Yes Take 325-650 mg by mouth every 6 (six) hours as needed for mild pain. [provider]  Active Self           Med Note EFRAIM, TYELISHA L   Tue Sep 29, 2023  2:49 PM)    albuterol  (ACCUNEB ) 0.63 MG/3ML nebulizer solution [id number]  Take 1 ampule by nebulization every 6 (six) hours as needed for wheezing. [provider]  Active Self  apixaban  (ELIQUIS ) 5 MG TABS tablet [id number]  Take 1 tablet (5 mg total) by mouth 2 (two) times daily. RESUME SUNDAY pm Fernande Elspeth BROCKS, MD  Active Self  atropine  1 % ophthalmic solution [id number]  Place 1 drop into both eyes at bedtime. [provider]  Active Self  cefadroxil  (DURICEF) 500 MG capsule [id number]  Take 1 capsule (  500 mg total) by mouth 2 (two) times daily. Fayette Bodily, MD  Active Self  dapagliflozin  propanediol (FARXIGA ) 10 MG TABS tablet [id number]  Take 1 tablet (10 mg total) by mouth daily before breakfast. Riddle, Suzann, NP  Active   diclofenac  Sodium (VOLTAREN ) 1 % topical gel 2 g [id number]   Bair, Kalpana, MD  Active   dofetilide  (TIKOSYN ) 250 MCG capsule [id number]  Take 1 capsule (250 mcg total) by mouth 2 (two) times daily. Riddle, Suzann, NP  Active   gabapentin  (NEURONTIN ) 600 MG tablet [id number]  Take 1 tablet (600 mg total) by mouth at bedtime. Bair, Luke, MD  Active   macitentan  (OPSUMIT ) 10 MG tablet [id number]  Take 1 tablet (10 mg total) by mouth daily. Rolan Ezra RAMAN, MD  Active Self  meclizine  (ANTIVERT ) 25 MG tablet [id number]  Take 1 tablet (25 mg total) by mouth 3 (three) times daily as needed for dizziness. Rolan Ezra RAMAN, MD  Active Self  Menthol , Topical Analgesic, (BIOFREEZE) 10 % CREA [id number]  Apply 1 Application topically daily as needed (apply to back for pain). [provider]  Active   midodrine  (PROAMATINE ) 5 MG tablet [id number]  Take 1 tablet (5 mg total) by mouth 3 (three) times daily with meals. Zenaida Morene PARAS, MD  Active   OXYGEN  [id number] Yes Inhale 4 L/min into the lungs continuous. [provider]  Active   potassium chloride  SA (KLOR-CON  M) 20 MEQ tablet [id number]  TAKE 2 TABLETS BY MOUTH IN THE MORNING AND 1 IN THE EVENING. PLEASE SCHEDULE APPOINTMENT FOR MORE REFILLS Rolan Ezra RAMAN, MD  Active   prednisoLONE  acetate (PRED FORTE ) 1 % ophthalmic suspension [id number]  Place 1 drop into both eyes 2 (two) times daily. [provider]  Active   rosuvastatin  (CRESTOR ) 10 MG tablet [id number]  Take 1 tablet (10 mg total) by mouth daily. Rolan Ezra RAMAN, MD  Active   sildenafil  (REVATIO ) 20 MG tablet [id number]  Take 1 tablet (20 mg total) by mouth 3 (three) times daily. Rolan Ezra RAMAN, MD  Active   spironolactone  (ALDACTONE ) 25 MG tablet [id number] Yes Take 1 tablet by mouth daily. [provider]  Active Self  torsemide  (DEMADEX ) 20 MG tablet [id number] Yes Take 3 tablets (60 mg total) by mouth daily. Rolan Ezra RAMAN, MD  Active   UPTRAVI  1600 MCG TABS [id number]  Take 1 tablet twice daily. Rolan Ezra RAMAN, MD  Active   Vitamin D , Ergocalciferol , (DRISDOL ) 1.25 MG (50000 UNIT) CAPS capsule [id number]  Take 1 capsule (50,000 Units total) by mouth every 7 (seven) days. Abbey Luke, MD  Active             Recommendation:   PCP Follow-up Specialty provider follow-up HF Clinic Continue Current Plan of Care  Follow Up Plan:   Telephone follow-up Tomorrow  [name], MSN, RN [facility]  [facility], [facility] RN Care Manager Direct Dial: [phone] Fax: [phone]

## 2024-02-09 ENCOUNTER — Other Ambulatory Visit: Payer: Self-pay

## 2024-02-09 NOTE — Patient Instructions (Signed)
 Visit Information  Thank you for taking time to visit with me today. Please don't hesitate to contact me if I can be of assistance to you before our next scheduled appointment.  Your next care management appointment is by telephone on 02/23/2024 at 1;00 pm  Telephone follow-up 2 Debra Saunders  Please call the care guide team at [phone] if you need to cancel, schedule, or reschedule an appointment.   Please call the Suicide and Crisis Lifeline: 988 call the [place]  National Suicide Prevention Lifeline: 986-145-8941 or TTY: 520-555-8458 TTY (332)878-0907) to talk to a trained counselor call 1-800-273-TALK (toll free, 24 hour hotline) go to [facility] [address] [place] [phone]) call 911 if you are experiencing a Mental Health or Behavioral Health Crisis or need someone to talk to.  Debra Duos, MSN, RN [facility], [facility] RN Care Manager Direct Dial: [phone] Fax: [phone]    Heart Failure: How to Manage Heart failure is a long-term condition where your heart can't pump enough blood through your body. When this happens, parts of your body don't get the blood and oxygen  they need. There's no cure for heart failure, so it's important to take good care of yourself and follow the treatment plan you set with your health care provider. If you're living with heart failure, there are ways to help you manage the disease. How to manage lifestyle changes Living with heart failure requires you to make changes in your daily life. Your health care team will teach you about the changes you need to make. These changes can help improve your symptoms and lower your risk of going to the hospital. Work with your provider to create a plan for you. Activity Ask your provider about cardiac rehabilitation programs. These programs include physical activity. If no cardiac rehab program is available, ask your provider  what exercises are safe for you to do. Ask what things are safe for you to do at home. Ask when you can go back to work or school. Pace your daily activities and allow time for rest. Managing stress It's normal to have many emotions when you're told you have heart failure. You may have fear, sadness, and anger. If you need help coping with any of these emotions, let your provider know. Here are some ways to help yourself manage these emotions: Talk to your friends and family about your condition. They can give you support and guidance. Explain your symptoms to them. If comfortable, you can invite them to attend appointments with you. Join a support group for people with heart failure. Talking with others who have the same symptoms may give you new ways of coping with your disease and your emotions. Accept help from others. Do not be ashamed if you need help. Use stress management techniques. Try things like meditation, breathing exercises, or listening to relaxing music. Conditions like depression and anxiety are common in people with heart failure. Pay attention to changes in your mood, emotions, and stress levels. Tell your provider if you have any of the following symptoms: Trouble sleeping or a change in your sleeping patterns. Feeling sad or depressed. Losing interest in activities you normally enjoy. Feeling irritable or crying for no reason. Often worrying about the future. Work If heart failure affects your ability to work, you may need to talk with your provider about making a plan for changes. This may include: Reducing your work hours. Finding tasks that require less effort. Planning rest periods  during work hours. Travel Talk with your provider if you plan to travel. There may be times when your provider suggests that you don't travel or you wait until your condition has improved. Bring your medicine and a list of your medicines. If you're traveling by public transportation  (airplane, train, bus), keep your medicines with you in a carry-on bag. If you have special needs, contact the transportation company prior to traveling. This may include needs related to diet, oxygen , a wheelchair, a seating request, or help with luggage. Think about finding a medical facility in the area you'll be traveling to. Find out what your health insurance will cover. If you use oxygen , make sure you bring enough oxygen  with you. If you have a battery-powered oxygen  device, bring a fully charged extra battery with you. If you have an implanted device, bring a note from your provider and tell the security screening workers that you have the device. You may need to go through special screening. Sexual activity  Ask your provider when it's safe for you to resume sexual activity. You may need to start slowly and increase intimacy over time. Regular exercise can benefit your sex life by building strength and endurance. Sleep If your condition affects your sleep, find ways to improve how well you sleep at night. These tips may help: Sleep lying on your side, or sleep with your head raised. Try: Raising the head of your bed. Using more than one pillow. Ask your provider about screening for sleep apnea. Try to go to sleep and wake up at the same times every day. Sleep in a dark, cool room. Do not exercise or eat for a few hours before bedtime. Plan rest periods during the day. But don't take long naps.  Where to find support In addition to talking with your family or friends, consider talking with: A mental health professional or therapist. A member of your church, faith, or community group. Other sources of support include: Local support groups. Ask your provider about groups near you. Online support groups, such as those found through: Heart Failure Society of America: hfsa.org American Heart Association: supportnetwork.heart.org Local community agencies or social agencies. A  palliative care specialist. Palliative care can help manage symptoms, promote comfort, improve quality of life, and maintain dignity. Where to find more information To learn more, go to these websites: Centers for Disease Control and Prevention at TonerPromos.no. Then: Click Health Topics. Type heart failure in the search box. American Heart Association: heart.org National Heart, Lung, and Blood Institute: BuffaloDryCleaner.gl Contact a health care provider if: You have trouble breathing when you're active. You have swelling in your feet or legs. You gain 2-3 lb (0.9-1.4 kg) in 24 hours, or 5 lb (2.3 kg) in a week. You have a dry cough. You're not able to take part in your usual physical activities. You feel dizzy or light-headed when you stand up. You have trouble sleeping. You have a decrease in appetite. You have symptoms of depression or anxiety. Get help right away if: You have trouble breathing when resting. You have trouble staying awake or you feel confused. You have chest pain. You faint. You have fast or irregular heartbeats. These symptoms may be an emergency. Call 911 right away. Do not wait to see if the symptoms will go away. Do not drive yourself to the hospital. This information is not intended to replace advice given to you by your health care provider. Make sure you discuss any questions you have with your  health care provider. Document Revised: 01/22/2023 Document Reviewed: 01/22/2023 Elsevier Patient Education  2024 Elsevier Inc.  Atrial Fibrillation Atrial fibrillation (AFib) is a type of heartbeat that is irregular or fast. If you have AFib, your heart beats without any order. This makes it hard for your heart to pump blood in a normal way. AFib may come and go, or it may become a long-lasting problem. If AFib is not treated, it can put you at higher risk for stroke, heart failure, and other heart problems. What are the causes? AFib may be caused by diseases that damage  the heart's electrical system. They include: High blood pressure. Heart failure. Heart valve diseases. Heart surgery. Diabetes. Thyroid  disease. Kidney disease. Lung diseases, such as pneumonia or COPD. Sleep apnea. Sometimes the cause is not known. What increases the risk? You are more likely to develop AFib if: You are older. You exercise often and very hard. You have a family history of AFib. You are female. You are Caucasian. You are overweight. You smoke. You drink a lot of alcohol. What are the signs or symptoms? Common symptoms of this condition include: A feeling that your heart is beating very fast. Chest pain or discomfort. Feeling short of breath. Suddenly feeling light-headed or weak. Getting tired easily during activity. Fainting. Sweating. In some cases, there are no symptoms. How is this treated? Medicines to: Prevent blood clots. Treat heart rate or heart rhythm problems. Using devices, such as a pacemaker, to correct heart rhythm problems. Doing surgery to remove the part of the heart that sends bad signals. Closing an area where clots can form in the heart (left atrial appendage). In some cases, your doctor will treat other underlying conditions. Follow these instructions at home: Medicines Take over-the-counter and prescription medicines only as told by your doctor. Do not take any new medicines without first talking to your doctor. If you are taking blood thinners: Talk with your doctor before taking aspirin  or NSAIDs, such as ibuprofen . Take your medicines as told. Take them at the same time each day. Do not do things that could hurt or bruise you. Be careful to avoid falls. Wear an alert bracelet or carry a card that says you take blood thinners. Lifestyle Do not smoke or use any products that contain nicotine or tobacco. If you need help quitting, ask your doctor. Eat heart-healthy foods. Talk with your doctor about the right eating plan for  you. Exercise regularly as told by your doctor. Do not drink alcohol. Lose weight if you are overweight. General instructions If you have sleep apnea, treat it as told by your doctor. Do not use diet pills unless your doctor says they are safe for you. Diet pills may make heart problems worse. Keep all follow-up visits. Your doctor will check your heart rate and rhythm regularly. Contact a doctor if: You notice a change in the speed, rhythm, or strength of your heartbeat. You are taking a blood-thinning medicine and you get more bruising. You get tired more easily when you move or exercise. You have a sudden change in weight. Get help right away if:  You have pain in your chest. You have trouble breathing. You have side effects of blood thinners, such as blood in your vomit, poop (stool), or pee (urine), or bleeding that cannot stop. You have any signs of a stroke. BE FAST is an easy way to remember the main warning signs: B - Balance. Dizziness, sudden trouble walking, or loss of balance. E -  Eyes. Trouble seeing or a change in how you see. F - Face. Sudden weakness or loss of feeling in the face. The face or eyelid may droop on one side. A - Arms.Weakness or loss of feeling in an arm. This happens suddenly and usually on one side of the body. S - Speech. Sudden trouble speaking, slurred speech, or trouble understanding what people say. T - Time.Time to call emergency services. Write down what time symptoms started. You have other signs of a stroke, such as: A sudden, very bad headache with no known cause. Feeling like you may vomit (nausea). Vomiting. A seizure. These symptoms may be an emergency. Get help right away. Call 911. Do not wait to see if the symptoms will go away. Do not drive yourself to the hospital. This information is not intended to replace advice given to you by your health care provider. Make sure you discuss any questions you have with your health care  provider. Document Revised: 02/26/2022 Document Reviewed: 02/26/2022 Elsevier Patient Education  2024 [employer].    Energy Conservation Techniques  Sit for as many activities as possible. Use slow, smooth movements.  Rushing increases discomfort. Determine the necessity of performing the task.  Simplify those tasks that are necessary.  (Get clothes out of the dryer when they are warm instead of ironing, let dishes air dry, etc.) Take frequent rests both during and between activities.  Avoid repetitive tasks. Pre-plan your activities; try a daily and/or weekly schedule.  Spread out the activities that are most fatiguing (break up cleaning tasks over multiple days). Remember to plan a balance of work, rest and recreation. Consider the best time for each activity.  Do the most exertive task when you have the most energy. Don't carry items if you can push them.  Slide, don't lift. Push, don't pull. Utilize two hands when appropriate. Maintain good posture and use proper body mechanics. Avoid remaining in one position for too long. When lifting, bend at the knees, not at the waist.  Exhale when bending down, inhale when straightening up. Carry objects as close to your body and as near to the center of the pelvis.  11. Avoid wasted body movements (position yourself for the task so that you avoid bending, twisting, etc.                when possible). 12. Select the best working environment.  Consider lighting, ventilation, clothing, and equipment. 13. Organize your storage areas, making the items you use daily convenient.  Store heaviest items at waist            height.  Store frequently used items between shoulders and knee height.  Consider leaving frequently used       items on countertops.  (You can organize in storage baskets based on time used/purpose). 14. Feelings and emotions can be real causes of fatigue.  Try to avoid unnecessary worry, irritation, or                    frustration.   Avoid stress, it can also be a source of fatigue. 15. Get help from other people for difficult tasks. 16. Explore equipment or items that may be able to do the job for you with greater ease.  (Electric can        openers, blenders, lightweight items for cleaning, etc.)

## 2024-02-09 NOTE — Patient Outreach (Signed)
 Complex Care Management   Visit Note  02/09/2024  Name:  Debra Saunders MRN: [id number] DOB: 1946-04-10  Situation: Referral received for Complex Care Management related to Heart Failure, Atrial Fibrillation, and CKD w/Anemia I obtained verbal consent from Patient.  Visit completed with Patient  on the phone  Background:   Past Medical History:  Diagnosis Date   Abdominal pain 07/06/2021   Acute hypokalemia 07/18/2023   Acute on chronic heart failure with preserved ejection fraction (HFpEF) (HCC) 07/17/2023   Arthritis    Atrial fibrillation, persistent (HCC)    a. s/p TEE-guided DCCV on 08/20/2017; b. 06/2021 Recurrent Afib-->amio/TEE/DCCV.   CAD (coronary artery disease)    a. 01/2012 Cath: nonobs dzs; b. 04/2014 MV: No ischemia; c. 09/2017 Cath: Minimal nonobs RCA dzs, otw nl cors. 09/2023: Tikosyn  loading   Cardiorenal syndrome    a. 09/2017 Creat rose to 2.7 w/ diuresis-->HD x 2.   Chronic back pain    Chronic combined systolic (congestive) and diastolic (congestive) heart failure (HCC)    a. Dx 2005 @ [facility];  b. 02/2014 Echo: EF 55-60%; c. 07/2017: EF 30-35%; d. 09/2017 Echo: EF 40-45%; e. 02/2018 Echo: EF 55-60%; f. 08/2019 Echo: EF 60-65%; g. 08/2020 RHC: RA 3, RV 30/1, PA 33/11 (20), PCWP 6; f. 08/2021 Echo: EF 60-65%, no rwma, GrIDD, nl RV fxn, RVSP 40.56mmHg, mod dil LA, mod TR.   Chronic respiratory failure (HCC)    a. on home O2   Gastritis    History of COVID-19    History of endocarditis 08/22/2021   History of intracranial hemorrhage    a. 6/14 described by neurosurgery as small frontal posttraumatic SAH  - pt was on xarelto  @ the time.   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 Chronic LLE edema.   NICM (nonischemic cardiomyopathy) (HCC)    a. 09/2017 Echo: EF 40-45%; b. 09/2017 Cath: minor, insignificant CAD; c. 02/2018 Echo: EF 55-60%, Gr1 DD; d.  08/2019 Echo: EF 60-65%, Gr2 DD.   Non-ST elevation (NSTEMI) myocardial infarction (HCC) 07/18/2023   Obesity    PAH (pulmonary artery hypertension) (HCC)    a. 09/2017 Echo: PASP ; b. 09/2017 RHC: RA 23, RV 84/13, PA 84/29, PCWP 20, CO 3.89, CI 1.89. LVEDP 18; c. 08/2020 RHC: RA 3, RV 30/1, PA 33/11 (20), PCWP 6. CO/CI 6.18/3.36. PVR 2.3 WU.   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 06/2021. Veg on PPM lead. Not candidate for extraction-->s/p 6 wks abx.   Sepsis with metabolic encephalopathy (HCC)    Sleep apnea    Streptococcal bacteremia    Stroke [facility]) 2014   Subarachnoid hemorrhage (HCC) 01/19/2013   Symptomatic bradycardia    a. s/p BSX PPM.   Thyroid  nodule    Type II diabetes mellitus (HCC)    Urinary incontinence     Assessment: Patient Reported Symptoms:  Cognitive Cognitive Status: No symptoms reported Cognitive/Intellectual Conditions Management [RPT]: None reported or documented in medical history or problem list   Health Maintenance Behaviors: Annual physical exam Healing Pattern: Unsure  Neurological Neurological Review of Symptoms: No symptoms reported Neurological Management Strategies: Routine screening, Medication therapy  HEENT HEENT Symptoms Reported: No symptoms reported      Cardiovascular Cardiovascular Symptoms Reported: Swelling in legs or feet Does patient  have uncontrolled Hypertension?: No Cardiovascular Management Strategies: Routine screening, Medication therapy Do You Have a Working Readable Scale?: Yes Weight: 194 lb (88 kg) Cardiovascular Comment: Patient reports feeling better today after extra torsemide , breathing normal on O2 4L, less fatigue, no lightheadedness/dizziness, no cough or difficulty laying down, reviewed CHF Action Plan and when to call HF Clinic and to call PCP if cannot reach HF Clinic, red flas for ED, confirmed patient has copy of CHF Action Plan and discussed not to  delay calling HF Clinic, important in avoiding ED and hospitalization  Respiratory Respiratory Symptoms Reported: Shortness of breath Other Respiratory Symptoms: O2 4L - breathing normal for her today, no incrased SOB Respiratory Management Strategies: CPAP, Oxygen  therapy, Routine screening  Endocrine Endocrine Symptoms Reported: No symptoms reported    Gastrointestinal Gastrointestinal Symptoms Reported: Not assessed      Genitourinary Genitourinary Symptoms Reported: No symptoms reported Additional Genitourinary Details: reports diuresing from torsemide  as expected    Integumentary Integumentary Symptoms Reported: Not assessed    Musculoskeletal Musculoskelatal Symptoms Reviewed: Not assessed        Psychosocial Psychosocial Symptoms Reported: No symptoms reported     Quality of Family Relationships: involved, supportive Do you feel physically threatened by others?: No    02/09/2024    PHQ2-9 Depression Screening   Little interest or pleasure in doing things Not at all  Feeling down, depressed, or hopeless Not at all  PHQ-2 - Total Score 0  Trouble falling or staying asleep, or sleeping too much    Feeling tired or having little energy    Poor appetite or overeating     Feeling bad about yourself - or that you are a failure or have let yourself or your family down    Trouble concentrating on things, such as reading the newspaper or watching television    Moving or speaking so slowly that other people could have noticed.  Or the opposite - being so fidgety or restless that you have been moving around a lot more than usual    Thoughts that you would be better off dead, or hurting yourself in some way    PHQ2-9 Total Score    If you checked off any problems, how difficult have these problems made it for you to do your work, take care of things at home, or get along with other people    Depression Interventions/Treatment      There were no vitals filed for this  visit.  Medications Reviewed Today     Reviewed by Debra Lands, RN (Registered Nurse) on 02/09/24 at 1544  Med List Status: <None>   Medication Order Taking? Sig Documenting Provider Last Dose Status Informant  acetaminophen  (TYLENOL ) 325 MG tablet [id number] Yes Take 325-650 mg by mouth every 6 (six) hours as needed for mild pain. [provider]  Active Self           Med Note EFRAIM, ALFREIDA CROME   Tue Sep 29, 2023  2:49 PM)    albuterol  (ACCUNEB ) 0.63 MG/3ML nebulizer solution [id number] Yes Take 1 ampule by nebulization every 6 (six) hours as needed for wheezing. [provider]  Active Self  apixaban  (ELIQUIS ) 5 MG TABS tablet [id number] Yes Take 1 tablet (5 mg total) by mouth 2 (two) times daily. RESUME SUNDAY pm Fernande Elspeth BROCKS, MD  Active Self  atropine  1 % ophthalmic solution [id number] Yes Place 1 drop into both eyes at bedtime. [provider]  Active Self  cefadroxil  (DURICEF) 500  MG capsule [id number] Yes Take 1 capsule (500 mg total) by mouth 2 (two) times daily. Fayette Bodily, MD  Active Self  dapagliflozin  propanediol (FARXIGA ) 10 MG TABS tablet [id number] Yes Take 1 tablet (10 mg total) by mouth daily before breakfast. Riddle, Suzann, NP  Active   diclofenac  Sodium (VOLTAREN ) 1 % topical gel 2 g [id number]   Bair, Kalpana, MD  Active   dofetilide  (TIKOSYN ) 250 MCG capsule [id number] Yes Take 1 capsule (250 mcg total) by mouth 2 (two) times daily. Riddle, Suzann, NP  Active   gabapentin  (NEURONTIN ) 600 MG tablet [id number] Yes Take 1 tablet (600 mg total) by mouth at bedtime. Bair, Luke, MD  Active   macitentan  (OPSUMIT ) 10 MG tablet [id number] Yes Take 1 tablet (10 mg total) by mouth daily. Rolan Ezra RAMAN, MD  Active Self  meclizine  (ANTIVERT ) 25 MG tablet [id number] Yes Take 1 tablet (25 mg total) by mouth 3 (three) times daily as needed for dizziness. Rolan Ezra RAMAN, MD  Active Self  Menthol , Topical Analgesic, (BIOFREEZE) 10 % CREA  [id number] Yes Apply 1 Application topically daily as needed (apply to back for pain). [provider]  Active   midodrine  (PROAMATINE ) 5 MG tablet [id number] Yes Take 1 tablet (5 mg total) by mouth 3 (three) times daily with meals. Zenaida Morene PARAS, MD  Active   OXYGEN  [id number] Yes Inhale 4 L/min into the lungs continuous. [provider]  Active   potassium chloride  SA (KLOR-CON  M) 20 MEQ tablet [id number] Yes TAKE 2 TABLETS BY MOUTH IN THE MORNING AND 1 IN THE EVENING. PLEASE SCHEDULE APPOINTMENT FOR MORE REFILLS Rolan Ezra RAMAN, MD  Active   prednisoLONE  acetate (PRED FORTE ) 1 % ophthalmic suspension [id number] Yes Place 1 drop into both eyes 2 (two) times daily. [provider]  Active   rosuvastatin  (CRESTOR ) 10 MG tablet [id number] Yes Take 1 tablet (10 mg total) by mouth daily. Rolan Ezra RAMAN, MD  Active   sildenafil  (REVATIO ) 20 MG tablet [id number] Yes Take 1 tablet (20 mg total) by mouth 3 (three) times daily. Rolan Ezra RAMAN, MD  Active   spironolactone  (ALDACTONE ) 25 MG tablet [id number] Yes Take 1 tablet by mouth daily. [provider]  Active Self  torsemide  (DEMADEX ) 20 MG tablet [id number] Yes Take 3 tablets (60 mg total) by mouth daily.  Patient taking differently: Take 60 mg by mouth daily. 80 mg yesterday, today and tomorrow per HF Clinic for weight gain   Rolan Ezra RAMAN, MD  Active   UPTRAVI  1600 MCG TABS [id number] Yes Take 1 tablet twice daily. Rolan Ezra RAMAN, MD  Active   Vitamin D , Ergocalciferol , (DRISDOL ) 1.25 MG (50000 UNIT) CAPS capsule [id number] Yes Take 1 capsule (50,000 Units total) by mouth every 7 (seven) days. Abbey Luke, MD  Active             Recommendation:   PCP Follow-up Specialty provider follow-up Heart Failure Clinic 02/15/2024 Continue Current Plan of Care  Follow Up Plan:   Telephone follow-up 2 Jalacia Mattila  [name], MSN, RN [facility]  [facility], [facility] RN Care  Manager Direct Dial: [phone] Fax: [phone]

## 2024-02-10 ENCOUNTER — Other Ambulatory Visit: Payer: Self-pay

## 2024-02-10 ENCOUNTER — Telehealth (HOSPITAL_COMMUNITY): Payer: Self-pay

## 2024-02-10 NOTE — Patient Outreach (Signed)
 Complex Care Management   Visit Note  02/10/2024  Name:  Debra Saunders MRN: [id number] DOB: 1945/09/15  Situation: Referral received for Complex Care Management related to SDOH Barriers:  Dental provider I obtained verbal consent from Guardian.  Visit completed with Guardian  on the phone  Background:   Past Medical History:  Diagnosis Date   Abdominal pain 07/06/2021   Acute hypokalemia 07/18/2023   Acute on chronic heart failure with preserved ejection fraction (HFpEF) (HCC) 07/17/2023   Arthritis    Atrial fibrillation, persistent (HCC)    a. s/p TEE-guided DCCV on 08/20/2017; b. 06/2021 Recurrent Afib-->amio/TEE/DCCV.   CAD (coronary artery disease)    a. 01/2012 Cath: nonobs dzs; b. 04/2014 MV: No ischemia; c. 09/2017 Cath: Minimal nonobs RCA dzs, otw nl cors. 09/2023: Tikosyn  loading   Cardiorenal syndrome    a. 09/2017 Creat rose to 2.7 w/ diuresis-->HD x 2.   Chronic back pain    Chronic combined systolic (congestive) and diastolic (congestive) heart failure (HCC)    a. Dx 2005 @ [facility];  b. 02/2014 Echo: EF 55-60%; c. 07/2017: EF 30-35%; d. 09/2017 Echo: EF 40-45%; e. 02/2018 Echo: EF 55-60%; f. 08/2019 Echo: EF 60-65%; g. 08/2020 RHC: RA 3, RV 30/1, PA 33/11 (20), PCWP 6; f. 08/2021 Echo: EF 60-65%, no rwma, GrIDD, nl RV fxn, RVSP 40.60mmHg, mod dil LA, mod TR.   Chronic respiratory failure (HCC)    a. on home O2   Gastritis    History of COVID-19    History of endocarditis 08/22/2021   History of intracranial hemorrhage    a. 6/14 described by neurosurgery as small frontal posttraumatic SAH  - pt was on xarelto  @ the time.   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 Chronic LLE edema.   NICM (nonischemic cardiomyopathy) (HCC)    a. 09/2017 Echo: EF 40-45%; b. 09/2017 Cath: minor, insignificant CAD; c. 02/2018 Echo: EF 55-60%, Gr1 DD; d. 08/2019 Echo: EF 60-65%,  Gr2 DD.   Non-ST elevation (NSTEMI) myocardial infarction (HCC) 07/18/2023   Obesity    PAH (pulmonary artery hypertension) (HCC)    a. 09/2017 Echo: PASP ; b. 09/2017 RHC: RA 23, RV 84/13, PA 84/29, PCWP 20, CO 3.89, CI 1.89. LVEDP 18; c. 08/2020 RHC: RA 3, RV 30/1, PA 33/11 (20), PCWP 6. CO/CI 6.18/3.36. PVR 2.3 WU.   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 06/2021. Veg on PPM lead. Not candidate for extraction-->s/p 6 wks abx.   Sepsis with metabolic encephalopathy (HCC)    Sleep apnea    Streptococcal bacteremia    Stroke [facility]) 2014   Subarachnoid hemorrhage (HCC) 01/19/2013   Symptomatic bradycardia    a. s/p BSX PPM.   Thyroid  nodule    Type II diabetes mellitus (HCC)    Urinary incontinence     Assessment:  Patients daughter has not be able to contact dental providers. Daughter plans to establish a dental provider in the next 2 weeks.   SDOH Interventions    Flowsheet Row Patient Outreach Telephone from 02/08/2024 in [place] POPULATION HEALTH DEPARTMENT Patient Outreach Telephone from 01/25/2024 in [place] POPULATION HEALTH DEPARTMENT Office Visit from 01/18/2024 in [facility] Dept Of [place] Telephone from 10/06/2023  in [place] POPULATION HEALTH DEPARTMENT Patient Outreach from 08/06/2023 in [place] POPULATION HEALTH DEPARTMENT Patient Outreach from 07/30/2023 in [place] POPULATION HEALTH DEPARTMENT  SDOH Interventions        Food Insecurity Interventions Intervention Not Indicated Intervention Not Indicated -- Intervention Not Indicated Intervention Not Indicated Intervention Not Indicated  Housing Interventions Intervention Not Indicated Intervention Not Indicated -- Intervention Not Indicated Intervention Not Indicated Intervention Not Indicated  Transportation Interventions Intervention Not Indicated Intervention Not Indicated [facility] [place] ([facility]  Only) Intervention Not Indicated Intervention Not Indicated Intervention Not Indicated  Utilities Interventions Intervention Not Indicated Intervention Not Indicated -- Intervention Not Indicated Intervention Not Indicated Intervention Not Indicated  Alcohol Usage Interventions -- -- -- -- -- Intervention Not Indicated (Score <7)  Financial Strain Interventions -- -- -- -- -- Intervention Not Indicated  Physical Activity Interventions -- -- -- -- -- Intervention Not Indicated  Stress Interventions -- -- -- -- -- Intervention Not Indicated  Social Connections Interventions -- -- -- -- -- Intervention Not Indicated  Health Literacy Interventions -- -- -- -- -- Intervention Not Indicated      Recommendation:   None  Follow Up Plan:   Telephone follow up appointment date/time:  02/26/24 at 10am  Tillman Gardener, BSW [place]  [facility], [facility] Social Worker Direct Dial: [phone]  Fax: [phone] Website: [email or web address]

## 2024-02-10 NOTE — Telephone Encounter (Signed)
 Spoke to daughter about medication changes. Aware of taking torsemide  80 mg x 3 days and then back to 60 mg daily .

## 2024-02-10 NOTE — Patient Instructions (Signed)
 Visit Information  Thank you for taking time to visit with me today. Please don't hesitate to contact me if I can be of assistance to you before our next scheduled appointment.  Your next care management appointment is by telephone on 02/26/24 at 10am   Please call the care guide team at [phone] if you need to cancel, schedule, or reschedule an appointment.   Please call 911 if you are experiencing a Mental Health or Behavioral Health Crisis or need someone to talk to.  Tillman Gardener, BSW [place]  [facility], [facility] Social Worker Direct Dial: [phone]  Fax: [phone] Website: [email or web address]

## 2024-02-15 ENCOUNTER — Ambulatory Visit: Attending: Cardiology | Admitting: Cardiology

## 2024-02-15 ENCOUNTER — Encounter: Payer: Self-pay | Admitting: Cardiology

## 2024-02-15 VITALS — BP 122/70 | HR 51 | Ht 60.0 in | Wt 191.0 lb

## 2024-02-15 DIAGNOSIS — I4819 Other persistent atrial fibrillation: Secondary | ICD-10-CM | POA: Diagnosis not present

## 2024-02-15 DIAGNOSIS — Z5181 Encounter for therapeutic drug level monitoring: Secondary | ICD-10-CM | POA: Diagnosis not present

## 2024-02-15 DIAGNOSIS — I5032 Chronic diastolic (congestive) heart failure: Secondary | ICD-10-CM

## 2024-02-15 DIAGNOSIS — Z950 Presence of cardiac pacemaker: Secondary | ICD-10-CM

## 2024-02-15 DIAGNOSIS — I82402 Acute embolism and thrombosis of unspecified deep veins of left lower extremity: Secondary | ICD-10-CM

## 2024-02-15 DIAGNOSIS — Z79899 Other long term (current) drug therapy: Secondary | ICD-10-CM

## 2024-02-15 NOTE — Patient Instructions (Signed)
 Medication Instructions:  Your physician recommends that you continue on your current medications as directed. Please refer to the Current Medication list given to you today.    *If you need a refill on your cardiac medications before your next appointment, please call your pharmacy*  Lab Work: No labs ordered today    Testing/Procedures: Your physician has requested that you have a lower extremity venous duplex. This test is an ultrasound of the veins in the legs or arms. It looks at venous blood flow that carries blood from the heart to the legs or arms. Allow one hour for a Lower Venous exam. Allow thirty minutes for an Upper Venous exam. There are no restrictions or special instructions.This will take place at [address] [facility] [address]) [address], [place] [address]  Please note: We ask at that you not bring children with you during ultrasound (echo/ vascular) testing. Due to room size and safety concerns, children are not allowed in the ultrasound rooms during exams. Our front office staff cannot provide observation of children in our lobby area while testing is being conducted. An adult accompanying a patient to their appointment will only be allowed in the ultrasound room at the discretion of the ultrasound technician under special circumstances. We apologize for any inconvenience.    Follow-Up: At [facility], you and your health needs are our priority.  As part of our continuing mission to provide you with exceptional heart care, our providers are all part of one team.  This team includes your primary Cardiologist (physician) and Advanced Practice Providers or APPs (Physician Assistants and Nurse Practitioners) who all work together to provide you with the care you need, when you need it.  Your next appointment:   3-4 month(s)  Provider:   Ole Holts, MD or Suzann Riddle, NP

## 2024-02-15 NOTE — Progress Notes (Signed)
 Electrophysiology Clinic Note    Date:  02/15/2024  Patient ID:  Debra Saunders, Debra Saunders [birth year], MRN [id number] PCP:  Abbey Bruckner, MD  Cardiologist:  Deatrice Cage, MD   Electrophysiologist:  Dr. Fernande > OLE ONEIDA HOLTS, MD (has not met yet) Electrophysiology APP:  Tenille Morrill, NP     Discussed the use of AI scribe software for clinical note transcription with the patient, who gave verbal consent to proceed.   Patient Profile    Chief Complaint: tikosyn  follow-up  History of Present Illness: Debra Saunders is a 78 y.o. female with PMH notable for SND s/p PPM, endocarditis w lead veg not candidate for system extraction on chronic suppressive abx, persis afib, ILD, pulmHTN, HTN, orthostatic hypotension, CKD, DVT, CVA ; seen today for OLE ONEIDA HOLTS, MD for routine electrophysiology followup.   I last saw her 10/2023 after recent tiksyn loading admission. Continued to have paroxysmal AF episodes but overall significantly reduced AF burden.   ON follow-up today, she is overall doing pretty well. She is using oxygen  all the time now. She is not aware of any AF episodes. She continues to take tikosyn  11a / 11p. Continues to use alarms and her daughter as reminders to take medicines.  She denies chest pain, chest pressure, palpitations. She continues to have increased LLE edema, is swollen today but she says it can get much more swollen. The area is tender to the touch.  She recently increase her torsemide  dose from 60mg  daily > 80mg  daily x 3 days for increased weight. Weight has since reduced.       Arrhythmia/Device History MDT dual chamber PPM, imp 12/2013; SND Previously had Bos sci generator   MDT Gen change 03/2023       AAD History: Amiodarone , stopped d/t concerns for ILD/pulm fibrosis  Tikosyn  - loaded 09/2023     ROS:  Please see the history of present illness. All other systems are reviewed and otherwise negative.    Physical Exam    VS:  BP  122/70 (BP Location: Left Arm, Patient Position: Sitting, Cuff Size: Normal)   Pulse (!) 51   Ht 5' (1.524 m)   Wt 191 lb (86.6 kg)   SpO2 93% Comment: 4 L  BMI 37.30 kg/m  BMI: Body mass index is 37.3 kg/m.      Wt Readings from Last 3 Encounters:  02/15/24 191 lb (86.6 kg)  02/09/24 194 lb (88 kg)  02/08/24 195 lb (88.5 kg)     GEN- The patient is well appearing, alert and oriented x 3 today.   Lungs- Clear to ausculation bilaterally, normal work of breathing.  Heart- Regular rate and rhythm, no murmurs, rubs or gallops Extremities- 2-3+ LLE, no RLE edema warm, dry Skin-  device pocket well-healed, no tethering   7/14 remote Device interrogation reviewed by myself:  Battery good Lead thresholds, impedence, sensing stable  Significant reduction in AFib burden, now < 1%    Studies Reviewed   Previous EP, cardiology notes.    EKG is ordered. Personal review of EKG from today shows:     EKG Interpretation Date/Time:  Monday February 15 2024 14:54:31 EDT Ventricular Rate:  51 PR Interval:  174 QRS Duration:  180 QT Interval:  532 QTC Calculation: 490 R Axis:   -79  Text Interpretation: AV dual-paced rhythm Confirmed by Rozell Theiler [phone]) on 02/15/2024 3:38:00 PM    10/2023 EKG - AV paced at 58, QT 512; QTC when corrected  for wide QRS 440  R/LHC, 07/20/2023 1.  Near normal coronary arteries. 2.  Left ventricular angiography was not performed.  EF was normal by echo. 3.  Right heart catheterization showed normal filling pressures, moderate pulmonary hypertension and normal cardiac output.   RA: 5 mmHg RV: 47/2 mmHg PW: 13 mmHg PA: 54/20 with a mean of 29 mmHg Cardiac output is 4.32 with an index of 2.36   RHC, 02/11/2022 1. Normal filling pressures.  2. Normal PA pressure.    TTE, 08/23/2021  1. Left ventricular ejection fraction, by estimation, is 60 to 65%. The left ventricle has normal function. The left ventricle has no regional wall motion abnormalities.  There is mild left ventricular hypertrophy. Left ventricular diastolic parameters are consistent with Grade I diastolic dysfunction (impaired relaxation).   2. Right ventricular systolic function is normal. The right ventricular size is normal. There is mildly elevated pulmonary artery systolic pressure. The estimated right ventricular systolic pressure is 40.3 mmHg.   3. Left atrial size was moderately dilated.   4. The mitral valve is normal in structure. No evidence of mitral valve regurgitation. No evidence of mitral stenosis.   5. Tricuspid valve regurgitation is moderate.   6. The aortic valve is normal in structure. Aortic valve regurgitation is not visualized. Aortic valve sclerosis/calcification is present, without any evidence of aortic stenosis.   7. The inferior vena cava is normal in size with greater than 50% respiratory variability, suggesting right atrial pressure of 3 mmHg.   8. RV pacer wire difficult to visualize, grossly no vegetation noted.     Assessment and Plan     #) persis AFib #) tikosyn  monitoring S/p tikosyn  loading 09/2023 EKG with stable QTC when corrected for paced-QRS Recent electrolytes stable Continue 250mg  tikosyn  q12h  #) Hypercoag d/t afib CHA2DS2-VASc Score = at least 9 [CHF History: 1, HTN History: 1, Diabetes History: 1, Stroke History: 2, Vascular Disease History: 1, Age Score: 2, Gender Score: 1].  Therefore, the patient's annual risk of stroke is 12.2 %.    Stroke ppx - 5mg  eliquis  BID, appropriately dosed No bleeding concerns    #) unilateral lower extremity edema #) chronic DVT Significantly worse today than previous visits Update L lower ultrasound Refer to vascular       Current medicines are reviewed at length with the patient today.   The patient does not have concerns regarding her medicines.  The following changes were made today:  none  Labs/ tests ordered today include:  Orders Placed This Encounter  Procedures    Ambulatory referral to Vascular Surgery   EKG 12-Lead   VAS US  LOWER EXTREMITY VENOUS (DVT)     Disposition: Follow up with Dr. Cindie or EP APP in 3 months   Signed, Chan Sheahan, NP  02/15/24  3:53 PM  Electrophysiology [facility]

## 2024-02-18 ENCOUNTER — Ambulatory Visit: Attending: Cardiology

## 2024-02-18 DIAGNOSIS — I82402 Acute embolism and thrombosis of unspecified deep veins of left lower extremity: Secondary | ICD-10-CM | POA: Diagnosis not present

## 2024-02-19 ENCOUNTER — Ambulatory Visit: Payer: Self-pay | Admitting: Cardiology

## 2024-02-23 ENCOUNTER — Other Ambulatory Visit: Payer: Self-pay

## 2024-02-23 NOTE — Patient Outreach (Signed)
 Complex Care Management   Visit Note  02/23/2024  Name:  Debra Saunders MRN: [id number] DOB: 11-13-1945  Situation: Referral received for Complex Care Management related to Heart Failure and Atrial Fibrillation I obtained verbal consent from Patient.  Visit completed with Caregiver Patient  on the phone  Background:   Past Medical History:  Diagnosis Date   Abdominal pain 07/06/2021   Acute hypokalemia 07/18/2023   Acute on chronic heart failure with preserved ejection fraction (HFpEF) (HCC) 07/17/2023   Arthritis    Atrial fibrillation, persistent (HCC)    a. s/p TEE-guided DCCV on 08/20/2017; b. 06/2021 Recurrent Afib-->amio/TEE/DCCV.   CAD (coronary artery disease)    a. 01/2012 Cath: nonobs dzs; b. 04/2014 MV: No ischemia; c. 09/2017 Cath: Minimal nonobs RCA dzs, otw nl cors. 09/2023: Tikosyn  loading   Cardiorenal syndrome    a. 09/2017 Creat rose to 2.7 w/ diuresis-->HD x 2.   Chronic back pain    Chronic combined systolic (congestive) and diastolic (congestive) heart failure (HCC)    a. Dx 2005 @ [facility];  b. 02/2014 Echo: EF 55-60%; c. 07/2017: EF 30-35%; d. 09/2017 Echo: EF 40-45%; e. 02/2018 Echo: EF 55-60%; f. 08/2019 Echo: EF 60-65%; g. 08/2020 RHC: RA 3, RV 30/1, PA 33/11 (20), PCWP 6; f. 08/2021 Echo: EF 60-65%, no rwma, GrIDD, nl RV fxn, RVSP 40.64mmHg, mod dil LA, mod TR.   Chronic respiratory failure (HCC)    a. on home O2   Gastritis    History of COVID-19    History of endocarditis 08/22/2021   History of intracranial hemorrhage    a. 6/14 described by neurosurgery as small frontal posttraumatic SAH  - pt was on xarelto  @ the time.   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 Chronic LLE edema.   NICM (nonischemic cardiomyopathy) (HCC)    a. 09/2017 Echo: EF 40-45%; b. 09/2017 Cath: minor, insignificant CAD; c. 02/2018 Echo: EF 55-60%, Gr1 DD; d. 08/2019  Echo: EF 60-65%, Gr2 DD.   Non-ST elevation (NSTEMI) myocardial infarction (HCC) 07/18/2023   Obesity    PAH (pulmonary artery hypertension) (HCC)    a. 09/2017 Echo: PASP ; b. 09/2017 RHC: RA 23, RV 84/13, PA 84/29, PCWP 20, CO 3.89, CI 1.89. LVEDP 18; c. 08/2020 RHC: RA 3, RV 30/1, PA 33/11 (20), PCWP 6. CO/CI 6.18/3.36. PVR 2.3 WU.   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 06/2021. Veg on PPM lead. Not candidate for extraction-->s/p 6 wks abx.   Sepsis with metabolic encephalopathy (HCC)    Sleep apnea    Streptococcal bacteremia    Stroke [facility]) 2014   Subarachnoid hemorrhage (HCC) 01/19/2013   Symptomatic bradycardia    a. s/p BSX PPM.   Thyroid  nodule    Type II diabetes mellitus (HCC)    Urinary incontinence     Assessment: Patient Reported Symptoms:  Cognitive Cognitive Status: Alert and oriented to person, place, and time, Normal speech and language skills, Insightful and able to interpret abstract concepts Cognitive/Intellectual Conditions Management [RPT]: None reported or documented in medical history or problem list   Health Maintenance Behaviors: Annual physical exam, Healthy diet Healing Pattern: Unsure Health Facilitated by: Healthy diet, Rest  Neurological Neurological Review of Symptoms: Dizziness (vertigo - using meclazine several times a week with  improvement) Neurological Management Strategies: Routine screening, Medication therapy  HEENT   HEENT Comment: Daughter has dental provider list - daughter will make appointment    Cardiovascular Cardiovascular Symptoms Reported: Swelling in legs or feet Weight: 190 lb (86.2 kg) Cardiovascular Comment: light-headed at itmes, different from vertigo - occasional CP with too much exertion, yesterday sweeping, CP went away with rest after 1 hour, was lightheaded, no sweating SOB, nausea jaw pain - aware red flags for ED, swelling L leg, US  doppler + chronic Right  DVT - will make vascular appointment, discussed sx of acute DVT requiring immediate eval and red flags for ED  Respiratory Other Respiratory Symptoms: O2 4L continuous - DOE, PO2 usu 98%, advised not to let it go below 90, states was dropping very low, discussed and mailed energy conservation Respiratory Management Strategies: Oxygen  therapy, CPAP, Routine screening Respiratory Self-Management Outcome: 3 (uncertain)  Endocrine Endocrine Symptoms Reported: No symptoms reported Is patient diabetic?: No Is patient checking blood sugars at home?: No    Gastrointestinal Gastrointestinal Symptoms Reported: No symptoms reported      Genitourinary Genitourinary Symptoms Reported: No symptoms reported Additional Genitourinary Details: Discussed UTI sx including ftigue confusion    Integumentary Integumentary Symptoms Reported: No symptoms reported    Musculoskeletal Musculoskelatal Symptoms Reviewed: Weakness Additional Musculoskeletal Details: rollatoe Musculoskeletal Management Strategies: Routine screening, Medical device Falls in the past year?: No Number of falls in past year: 1 or less Was there an injury with Fall?: No Fall Risk Category Calculator: 0 Patient Fall Risk Level: Low Fall Risk Patient at Risk for Falls Due to: Impaired balance/gait, Impaired mobility Fall risk Follow up: Falls evaluation completed, Falls prevention discussed (Anticoagulent - ED for head bumps)  Psychosocial       Quality of Family Relationships: involved, supportive Do you feel physically threatened by others?: No    02/23/2024    PHQ2-9 Depression Screening   Little interest or pleasure in doing things Not at all  Feeling down, depressed, or hopeless Not at all  PHQ-2 - Total Score 0  Trouble falling or staying asleep, or sleeping too much    Feeling tired or having little energy    Poor appetite or overeating     Feeling bad about yourself - or that you are a failure or have let yourself or your  family down    Trouble concentrating on things, such as reading the newspaper or watching television    Moving or speaking so slowly that other people could have noticed.  Or the opposite - being so fidgety or restless that you have been moving around a lot more than usual    Thoughts that you would be better off dead, or hurting yourself in some way    PHQ2-9 Total Score    If you checked off any problems, how difficult have these problems made it for you to do your work, take care of things at home, or get along with other people    Depression Interventions/Treatment      There were no vitals filed for this visit.  Medications Reviewed Today     Reviewed by Devra Lands, RN (Registered Nurse) on 02/23/24 at 1319  Med List Status: <None>   Medication Order Taking? Sig Documenting Provider Last Dose Status Informant  acetaminophen  (TYLENOL ) 325 MG tablet [id number] Yes Take 325-650 mg by mouth every 6 (six) hours as needed for mild pain. [provider]  Active Self  Med Note (HAGOPIAN, TYELISHA L   Tue Sep 29, 2023  2:49 PM)    albuterol  (ACCUNEB ) 0.63 MG/3ML nebulizer solution [id number] Yes Take 1 ampule by nebulization every 6 (six) hours as needed for wheezing. [provider]  Active Self  apixaban  (ELIQUIS ) 5 MG TABS tablet [id number] Yes Take 1 tablet (5 mg total) by mouth 2 (two) times daily. RESUME SUNDAY pm Fernande Elspeth BROCKS, MD  Active Self  atropine  1 % ophthalmic solution [id number] Yes Place 1 drop into both eyes at bedtime. [provider]  Active Self  cefadroxil  (DURICEF) 500 MG capsule [id number] Yes Take 1 capsule (500 mg total) by mouth 2 (two) times daily. Fayette Bodily, MD  Active Self  dapagliflozin  propanediol (FARXIGA ) 10 MG TABS tablet [id number] Yes Take 1 tablet (10 mg total) by mouth daily before breakfast. Riddle, Suzann, NP  Active   diclofenac  Sodium (VOLTAREN ) 1 % topical gel 2 g [id number]   Bair, Kalpana, MD  Active    dofetilide  (TIKOSYN ) 250 MCG capsule [id number] Yes Take 1 capsule (250 mcg total) by mouth 2 (two) times daily. Riddle, Suzann, NP  Active   gabapentin  (NEURONTIN ) 600 MG tablet [id number] Yes Take 1 tablet (600 mg total) by mouth at bedtime. Bair, Kalpana, MD  Active   macitentan  (OPSUMIT ) 10 MG tablet [id number] Yes Take 1 tablet (10 mg total) by mouth daily. Rolan Ezra RAMAN, MD  Active Self  meclizine  (ANTIVERT ) 25 MG tablet [id number] Yes Take 1 tablet (25 mg total) by mouth 3 (three) times daily as needed for dizziness. Rolan Ezra RAMAN, MD  Active Self  Menthol , Topical Analgesic, (BIOFREEZE) 10 % CREA [id number] Yes Apply 1 Application topically daily as needed (apply to back for pain). [provider]  Active   midodrine  (PROAMATINE ) 5 MG tablet [id number] Yes Take 1 tablet (5 mg total) by mouth 3 (three) times daily with meals. Zenaida Morene PARAS, MD  Active   OXYGEN  [id number] Yes Inhale 4 L/min into the lungs continuous. [provider]  Active   potassium chloride  SA (KLOR-CON  M) 20 MEQ tablet [id number] Yes TAKE 2 TABLETS BY MOUTH IN THE MORNING AND 1 IN THE EVENING. PLEASE SCHEDULE APPOINTMENT FOR MORE REFILLS Rolan Ezra RAMAN, MD  Active   prednisoLONE  acetate (PRED FORTE ) 1 % ophthalmic suspension [id number] Yes Place 1 drop into both eyes 2 (two) times daily. [provider]  Active   rosuvastatin  (CRESTOR ) 10 MG tablet [id number] Yes Take 1 tablet (10 mg total) by mouth daily. Rolan Ezra RAMAN, MD  Active   sildenafil  (REVATIO ) 20 MG tablet [id number] Yes Take 1 tablet (20 mg total) by mouth 3 (three) times daily. Rolan Ezra RAMAN, MD  Active   spironolactone  (ALDACTONE ) 25 MG tablet [id number] Yes Take 1 tablet by mouth daily. [provider]  Active Self  torsemide  (DEMADEX ) 20 MG tablet [id number] Yes Take 3 tablets (60 mg total) by mouth daily. Rolan Ezra RAMAN, MD  Active   UPTRAVI  1600 MCG TABS [id number] Yes Take 1 tablet twice daily. Rolan Ezra RAMAN,  MD  Active   Vitamin D , Ergocalciferol , (DRISDOL ) 1.25 MG (50000 UNIT) CAPS capsule [id number] Yes Take 1 capsule (50,000 Units total) by mouth every 7 (seven) days. Abbey Bruckner, MD  Active             Recommendation:   PCP Follow-up Continue Current Plan of Care Appointments with Vascular and Dental  Follow Up Plan:   Telephone follow-up in 1 month  [name], MSN,  RN [facility]  [facility], [facility] RN Care Manager Direct Dial: [phone] Fax: [phone]

## 2024-02-23 NOTE — Patient Instructions (Signed)
 Visit Information  Thank you for taking time to visit with me today. Please don't hesitate to contact me if I can be of assistance to you before our next scheduled appointment.  Your next care management appointment is by telephone on 03/22/2024 at 1;00 pm  Telephone follow-up in 1 month  Please call the care guide team at [phone] if you need to cancel, schedule, or reschedule an appointment.   Please make appointments with: VASCULAR & DENTAL Please keep oxygen  levels: ABOVE 90%, if cannot maintain during activity with periods of rest please call provider for appointment.   Please call the Suicide and Crisis Lifeline: 988 call the [place]  National Suicide Prevention Lifeline: 7270463642 or TTY: 4011634998 TTY 863-688-2476) to talk to a trained counselor call 1-800-273-TALK (toll free, 24 hour hotline) call the [facility]: [phone] call 911 if you are experiencing a Mental Health or Behavioral Health Crisis or need someone to talk to.  [name], MSN, RN [facility], [facility] Health RN Care Manager Direct Dial: [phone] Fax: [phone]

## 2024-02-24 ENCOUNTER — Other Ambulatory Visit: Payer: Self-pay

## 2024-02-24 NOTE — Patient Instructions (Signed)
 Visit Information  Thank you for taking time to visit with me today. Please don't hesitate to contact me if I can be of assistance to you before our next scheduled appointment.   Please call the care guide team at [phone] if you need to cancel, schedule, or reschedule an appointment.   Please call 911 if you are experiencing a Mental Health or Behavioral Health Crisis or need someone to talk to.  Dallis Dues, BSW [place]  [facility], [facility] Social Worker Direct Dial: [phone]  Fax: [phone] Website: [email or web address]

## 2024-02-24 NOTE — Patient Outreach (Signed)
 Complex Care Management   Visit Note  02/24/2024  Name:  Debra Saunders MRN: [id number] DOB: 1945-10-13  Situation: Referral received for Complex Care Management related to SDOH Barriers:  Dental provider I obtained verbal consent from POA.  Visit completed with POA  on the phone  Background:   Past Medical History:  Diagnosis Date   Abdominal pain 07/06/2021   Acute hypokalemia 07/18/2023   Acute on chronic heart failure with preserved ejection fraction (HFpEF) (HCC) 07/17/2023   Arthritis    Atrial fibrillation, persistent (HCC)    a. s/p TEE-guided DCCV on 08/20/2017; b. 06/2021 Recurrent Afib-->amio/TEE/DCCV.   CAD (coronary artery disease)    a. 01/2012 Cath: nonobs dzs; b. 04/2014 MV: No ischemia; c. 09/2017 Cath: Minimal nonobs RCA dzs, otw nl cors. 09/2023: Tikosyn  loading   Cardiorenal syndrome    a. 09/2017 Creat rose to 2.7 w/ diuresis-->HD x 2.   Chronic back pain    Chronic combined systolic (congestive) and diastolic (congestive) heart failure (HCC)    a. Dx 2005 @ [facility];  b. 02/2014 Echo: EF 55-60%; c. 07/2017: EF 30-35%; d. 09/2017 Echo: EF 40-45%; e. 02/2018 Echo: EF 55-60%; f. 08/2019 Echo: EF 60-65%; g. 08/2020 RHC: RA 3, RV 30/1, PA 33/11 (20), PCWP 6; f. 08/2021 Echo: EF 60-65%, no rwma, GrIDD, nl RV fxn, RVSP 40.43mmHg, mod dil LA, mod TR.   Chronic respiratory failure (HCC)    a. on home O2   Gastritis    History of COVID-19    History of endocarditis 08/22/2021   History of intracranial hemorrhage    a. 6/14 described by neurosurgery as small frontal posttraumatic SAH  - pt was on xarelto  @ the time.   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 Chronic LLE edema.   NICM (nonischemic cardiomyopathy) (HCC)    a. 09/2017 Echo: EF 40-45%; b. 09/2017 Cath: minor, insignificant CAD; c. 02/2018 Echo: EF 55-60%, Gr1 DD; d. 08/2019 Echo: EF 60-65%, Gr2 DD.    Non-ST elevation (NSTEMI) myocardial infarction (HCC) 07/18/2023   Obesity    PAH (pulmonary artery hypertension) (HCC)    a. 09/2017 Echo: PASP ; b. 09/2017 RHC: RA 23, RV 84/13, PA 84/29, PCWP 20, CO 3.89, CI 1.89. LVEDP 18; c. 08/2020 RHC: RA 3, RV 30/1, PA 33/11 (20), PCWP 6. CO/CI 6.18/3.36. PVR 2.3 WU.   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 06/2021. Veg on PPM lead. Not candidate for extraction-->s/p 6 wks abx.   Sepsis with metabolic encephalopathy (HCC)    Sleep apnea    Streptococcal bacteremia    Stroke [facility]) 2014   Subarachnoid hemorrhage (HCC) 01/19/2013   Symptomatic bradycardia    a. s/p BSX PPM.   Thyroid  nodule    Type II diabetes mellitus (HCC)    Urinary incontinence     Assessment:  Patients daughter has not been able to contact dental providers due to several medical appointments. Daughter plans to establish a dental provider but patient has not expressed any urgency to schedule or select a dentist. No follow up is requested by POA.  SDOH Interventions    Flowsheet Row Patient Outreach Telephone from 02/23/2024 in [place] POPULATION HEALTH DEPARTMENT Patient Outreach Telephone from 02/08/2024 in [place] [address] POPULATION HEALTH DEPARTMENT Patient Outreach Telephone from  01/25/2024 in [place] POPULATION HEALTH DEPARTMENT Office Visit from 01/18/2024 in [facility] Of [place] Telephone from 10/06/2023 in [place] POPULATION HEALTH DEPARTMENT Patient Outreach from 08/06/2023 in [place] POPULATION HEALTH DEPARTMENT  SDOH Interventions        Food Insecurity Interventions Intervention Not Indicated Intervention Not Indicated Intervention Not Indicated -- Intervention Not Indicated Intervention Not Indicated  Housing Interventions Intervention Not Indicated Intervention Not Indicated Intervention Not Indicated -- Intervention Not Indicated Intervention Not  Indicated  Transportation Interventions Intervention Not Indicated Intervention Not Indicated Intervention Not Indicated [facility] [place] ([facility] Only) Intervention Not Indicated Intervention Not Indicated  Utilities Interventions Intervention Not Indicated Intervention Not Indicated Intervention Not Indicated -- Intervention Not Indicated Intervention Not Indicated    Recommendation:   None  Follow Up Plan:   Patient has met all care management goals. Care Management case will be closed. Patient has been provided contact information should new needs arise.   Tillman Gardener, BSW [place]  [facility], [facility] Social Worker Direct Dial: [phone]  Fax: [phone] Website: [email or web address]

## 2024-03-10 ENCOUNTER — Ambulatory Visit: Payer: Medicare HMO | Admitting: Infectious Diseases

## 2024-03-15 ENCOUNTER — Telehealth: Payer: Self-pay | Admitting: *Deleted

## 2024-03-15 ENCOUNTER — Ambulatory Visit (INDEPENDENT_AMBULATORY_CARE_PROVIDER_SITE_OTHER): Payer: Medicare HMO | Admitting: *Deleted

## 2024-03-15 VITALS — Ht 60.0 in | Wt 182.0 lb

## 2024-03-15 DIAGNOSIS — Z0000 Encounter for general adult medical examination without abnormal findings: Secondary | ICD-10-CM

## 2024-03-15 NOTE — Progress Notes (Signed)
 Subjective:   DEMIAH Saunders is a 78 y.o. who presents for a Medicare Wellness preventive visit.  As a reminder, Annual Wellness Visits don't include a physical exam, and some assessments may be limited, especially if this visit is performed virtually. We may recommend an in-person follow-up visit with your provider if needed.  Visit Complete: Virtual I connected with  Debra Saunders on 03/15/24 by a audio enabled telemedicine application and verified that I am speaking with the correct person using two identifiers.  Patient Location: Home  Provider Location: Home Office  I discussed the limitations of evaluation and management by telemedicine. The patient expressed understanding and agreed to proceed.  Vital Signs: Because this visit was a virtual/telehealth visit, some criteria may be missing or patient reported. Any vitals not documented were not able to be obtained and vitals that have been documented are patient reported.  VideoDeclined- This patient declined Librarian, academic. Therefore the visit was completed with audio only.  Persons Participating in Visit: Patient.  AWV Questionnaire: No: Patient Medicare AWV questionnaire was not completed prior to this visit.  Cardiac Risk Factors include: advanced age (>77men, >68 women);diabetes mellitus;hypertension;dyslipidemia;obesity (BMI >30kg/m2);Other (see comment);sedentary lifestyle, Risk factor comments: AFib, pacemaker     Objective:    Today's Vitals   03/15/24 1337 03/15/24 1338  Weight: 182 lb (82.6 kg)   Height: 5' (1.524 m)   PainSc:  5    Body mass index is 35.54 kg/m.     03/15/2024    1:54 PM 02/23/2024    1:45 PM 02/08/2024    4:28 PM 01/25/2024    2:07 PM 01/18/2024    1:40 PM 10/19/2023   12:55 PM 09/29/2023    2:42 PM  Advanced Directives  Does Patient Have a Medical Advance Directive? Yes Yes Yes Yes No Yes Yes  Type of Estate agent of [place];Living  will Healthcare Power of [employer] Power of [employer] Power of [employer] Power of [place];Living will Healthcare Power of [place];Living will  Does patient want to make changes to medical advance directive?  No - Patient declined  No - Patient declined   Yes (Inpatient - patient requests chaplain consult to change a medical advance directive)  Copy of Healthcare Power of Attorney in Chart? No - copy requested No - copy requested No - copy requested No - copy requested   No - copy requested  Would patient like information on creating a medical advance directive? No - Patient declined No - Patient declined No - Patient declined  No - Patient declined      Current Medications (verified) Outpatient Encounter Medications as of 03/15/2024  Medication Sig   acetaminophen  (TYLENOL ) 325 MG tablet Take 325-650 mg by mouth every 6 (six) hours as needed for mild pain.   albuterol  (ACCUNEB ) 0.63 MG/3ML nebulizer solution Take 1 ampule by nebulization every 6 (six) hours as needed for wheezing.   apixaban  (ELIQUIS ) 5 MG TABS tablet Take 1 tablet (5 mg total) by mouth 2 (two) times daily. RESUME SUNDAY pm   atropine  1 % ophthalmic solution Place 1 drop into both eyes at bedtime.   cefadroxil  (DURICEF) 500 MG capsule Take 1 capsule (500 mg total) by mouth 2 (two) times daily.   dapagliflozin  propanediol (FARXIGA ) 10 MG TABS tablet Take 1 tablet (10 mg total) by mouth daily before breakfast.   dofetilide  (TIKOSYN ) 250 MCG capsule Take 1 capsule (250 mcg total) by mouth 2 (two)  times daily.   gabapentin  (NEURONTIN ) 600 MG tablet Take 1 tablet (600 mg total) by mouth at bedtime.   macitentan  (OPSUMIT ) 10 MG tablet Take 1 tablet (10 mg total) by mouth daily.   meclizine  (ANTIVERT ) 25 MG tablet Take 1 tablet (25 mg total) by mouth 3 (three) times daily as needed for dizziness.   Menthol , Topical Analgesic, (BIOFREEZE) 10 % CREA Apply 1 Application topically daily as needed (apply to  back for pain).   midodrine  (PROAMATINE ) 5 MG tablet Take 1 tablet (5 mg total) by mouth 3 (three) times daily with meals.   OXYGEN  Inhale 4 L/min into the lungs continuous.   potassium chloride  SA (KLOR-CON  M) 20 MEQ tablet TAKE 2 TABLETS BY MOUTH IN THE MORNING AND 1 IN THE EVENING. PLEASE SCHEDULE APPOINTMENT FOR MORE REFILLS   prednisoLONE  acetate (PRED FORTE ) 1 % ophthalmic suspension Place 1 drop into both eyes 2 (two) times daily.   rosuvastatin  (CRESTOR ) 10 MG tablet Take 1 tablet (10 mg total) by mouth daily.   sildenafil  (REVATIO ) 20 MG tablet Take 1 tablet (20 mg total) by mouth 3 (three) times daily.   spironolactone  (ALDACTONE ) 25 MG tablet Take 1 tablet by mouth daily.   torsemide  (DEMADEX ) 20 MG tablet Take 3 tablets (60 mg total) by mouth daily.   UPTRAVI  1600 MCG TABS Take 1 tablet twice daily.   Vitamin D , Ergocalciferol , (DRISDOL ) 1.25 MG (50000 UNIT) CAPS capsule Take 1 capsule (50,000 Units total) by mouth every 7 (seven) days.   Facility-Administered Encounter Medications as of 03/15/2024  Medication   diclofenac  Sodium (VOLTAREN ) 1 % topical gel 2 g    Allergies (verified) Aspirin  and Other   History: Past Medical History:  Diagnosis Date   Abdominal pain 07/06/2021   Acute hypokalemia 07/18/2023   Acute on chronic heart failure with preserved ejection fraction (HFpEF) (HCC) 07/17/2023   Arthritis    Atrial fibrillation, persistent (HCC)    a. s/p TEE-guided DCCV on 08/20/2017; b. 06/2021 Recurrent Afib-->amio/TEE/DCCV.   CAD (coronary artery disease)    a. 01/2012 Cath: nonobs dzs; b. 04/2014 MV: No ischemia; c. 09/2017 Cath: Minimal nonobs RCA dzs, otw nl cors. 09/2023: Tikosyn  loading   Cardiorenal syndrome    a. 09/2017 Creat rose to 2.7 w/ diuresis-->HD x 2.   Chronic back pain    Chronic combined systolic (congestive) and diastolic (congestive) heart failure (HCC)    a. Dx 2005 @ [facility];  b. 02/2014 Echo: EF 55-60%; c. 07/2017: EF 30-35%; d. 09/2017 Echo: EF  40-45%; e. 02/2018 Echo: EF 55-60%; f. 08/2019 Echo: EF 60-65%; g. 08/2020 RHC: RA 3, RV 30/1, PA 33/11 (20), PCWP 6; f. 08/2021 Echo: EF 60-65%, no rwma, GrIDD, nl RV fxn, RVSP 40.27mmHg, mod dil LA, mod TR.   Chronic respiratory failure (HCC)    a. on home O2   Gastritis    History of COVID-19    History of endocarditis 08/22/2021   History of intracranial hemorrhage    a. 6/14 described by neurosurgery as small frontal posttraumatic SAH  - pt was on xarelto  @ the time.   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 Chronic LLE edema.   NICM (nonischemic cardiomyopathy) (HCC)    a. 09/2017 Echo: EF 40-45%; b. 09/2017 Cath: minor, insignificant CAD; c. 02/2018 Echo: EF 55-60%, Gr1 DD; d.  08/2019 Echo: EF 60-65%, Gr2 DD.   Non-ST elevation (NSTEMI) myocardial infarction (HCC) 07/18/2023   Obesity    PAH (pulmonary artery hypertension) (HCC)    a. 09/2017 Echo: PASP ; b. 09/2017 RHC: RA 23, RV 84/13, PA 84/29, PCWP 20, CO 3.89, CI 1.89. LVEDP 18; c. 08/2020 RHC: RA 3, RV 30/1, PA 33/11 (20), PCWP 6. CO/CI 6.18/3.36. PVR 2.3 WU.   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 06/2021. Veg on PPM lead. Not candidate for extraction-->s/p 6 wks abx.   Sepsis with metabolic encephalopathy (HCC)    Sleep apnea    Streptococcal bacteremia    Stroke [facility]) 2014   Subarachnoid hemorrhage (HCC) 01/19/2013   Symptomatic bradycardia    a. s/p BSX PPM.   Thyroid  nodule    Type II diabetes mellitus (HCC)    Urinary incontinence    Past Surgical History:  Procedure Laterality Date   ABDOMINAL HYSTERECTOMY  1969   CARDIAC CATHETERIZATION  2013   @ [facility]: No obstructive CAD: Only 20% ostial left circumflex.   CARDIOVERSION N/A 10/12/2017   Procedure: CARDIOVERSION;  Surgeon: Perla Evalene PARAS, MD;  Location: [facility] ORS;  Service:  Cardiovascular;  Laterality: N/A;   CARDIOVERSION N/A 07/04/2021   Procedure: CARDIOVERSION;  Surgeon: Rolan Ezra RAMAN, MD;  Location: [facility] ENDOSCOPY;  Service: Cardiovascular;  Laterality: N/A;   CARDIOVERSION N/A 03/27/2023   Procedure: CARDIOVERSION;  Surgeon: Rolan Ezra RAMAN, MD;  Location: [facility] ORS;  Service: Cardiovascular;  Laterality: N/A;   CARDIOVERSION N/A 10/01/2023   Procedure: CARDIOVERSION;  Surgeon: Rolan Ezra RAMAN, MD;  Location: [facility] INVASIVE CV LAB;  Service: Cardiovascular;  Laterality: N/A;   CATARACT EXTRACTION W/PHACO Right 09/19/2020   Procedure: CATARACT EXTRACTION PHACO AND INTRAOCULAR LENS PLACEMENT (IOC) RIGHT DIABETIC 3.95 00:43.0 9.2%;  Surgeon: Mittie Gaskin, MD;  Location: [facility] SURGERY CNTR;  Service: Ophthalmology;  Laterality: Right;  Pt requests to be last sleep apnea   CATARACT EXTRACTION W/PHACO Left 10/03/2020   Procedure: CATARACT EXTRACTION PHACO AND INTRAOCULAR LENS PLACEMENT (IOC) LEFT DIABETIC;  Surgeon: Mittie Gaskin, MD;  Location: [facility] SURGERY CNTR;  Service: Ophthalmology;  Laterality: Left;  3.25 0:59.3 5.5%   CHOLECYSTECTOMY     COLONOSCOPY WITH PROPOFOL  N/A 09/10/2018   Procedure: COLONOSCOPY WITH PROPOFOL ;  Surgeon: Viktoria Lamar DASEN, MD;  Location: [facility] ENDOSCOPY;  Service: Endoscopy;  Laterality: N/A;   COLONOSCOPY WITH PROPOFOL  N/A 10/22/2022   Procedure: COLONOSCOPY WITH PROPOFOL ;  Surgeon: Onita Elspeth Sharper, DO;  Location: [facility] ENDOSCOPY;  Service: Gastroenterology;  Laterality: N/A;   CYST REMOVAL TRUNK  2002   BACK   DIALYSIS/PERMA CATHETER INSERTION N/A 10/06/2017   Procedure: DIALYSIS/PERMA CATHETER INSERTION;  Surgeon: Jama Cordella MATSU, MD;  Location: [facility] INVASIVE CV LAB;  Service: Cardiovascular;  Laterality: N/A;   ESOPHAGOGASTRODUODENOSCOPY (EGD) WITH PROPOFOL  N/A 10/21/2022   Procedure: ESOPHAGOGASTRODUODENOSCOPY (EGD) WITH PROPOFOL ;  Surgeon: Onita Elspeth Sharper, DO;  Location: [facility] ENDOSCOPY;  Service:  Gastroenterology;  Laterality: N/A;   GIVENS CAPSULE STUDY N/A 10/22/2022   Procedure: GIVENS CAPSULE STUDY;  Surgeon: Onita Elspeth Sharper, DO;  Location: [facility] ENDOSCOPY;  Service: Gastroenterology;  Laterality: N/A;  IF NOTHING IS FOUND WITH COLONOSCOPY   INSERT / REPLACE / REMOVE PACEMAKER     lipoma removal  2014   back   PACEMAKER INSERTION  2015   [employer] dual chamber pacemaker implanted by Dr Fernande for symptomatic bradycardia   PERMANENT PACEMAKER INSERTION N/A 01/09/2014   Procedure:  PERMANENT PACEMAKER INSERTION;  Surgeon: Elspeth JAYSON Sage, MD;  Location: [facility] CATH LAB;  Service: Cardiovascular;  Laterality: N/A;   PPM GENERATOR CHANGEOUT N/A 04/01/2023   Procedure: PPM GENERATOR CHANGEOUT;  Surgeon: Sage Elspeth JAYSON, MD;  Location: [facility] INVASIVE CV LAB;  Service: Cardiovascular;  Laterality: N/A;   RIGHT HEART CATH N/A 04/19/2018   Procedure: RIGHT HEART CATH;  Surgeon: Rolan Ezra RAMAN, MD;  Location: [facility] INVASIVE CV LAB;  Service: Cardiovascular;  Laterality: N/A;   RIGHT HEART CATH N/A 08/30/2020   Procedure: RIGHT HEART CATH;  Surgeon: Rolan Ezra RAMAN, MD;  Location: [facility] INVASIVE CV LAB;  Service: Cardiovascular;  Laterality: N/A;   RIGHT HEART CATH N/A 02/11/2022   Procedure: RIGHT HEART CATH;  Surgeon: Rolan Ezra RAMAN, MD;  Location: [facility] INVASIVE CV LAB;  Service: Cardiovascular;  Laterality: N/A;   RIGHT/LEFT HEART CATH AND CORONARY ANGIOGRAPHY N/A 10/19/2017   Procedure: RIGHT/LEFT HEART CATH AND CORONARY ANGIOGRAPHY;  Surgeon: Darron Deatrice LABOR, MD;  Location: [facility] INVASIVE CV LAB;  Service: Cardiovascular;  Laterality: N/A;   RIGHT/LEFT HEART CATH AND CORONARY ANGIOGRAPHY N/A 07/20/2023   Procedure: RIGHT/LEFT HEART CATH AND CORONARY ANGIOGRAPHY;  Surgeon: Darron Deatrice LABOR, MD;  Location: [facility] INVASIVE CV LAB;  Service: Cardiovascular;  Laterality: N/A;   TEE WITHOUT CARDIOVERSION N/A 08/20/2017   Procedure: TRANSESOPHAGEAL ECHOCARDIOGRAM (TEE) & Direct current  cardioversion;  Surgeon: Perla Evalene PARAS, MD;  Location: [facility] ORS;  Service: Cardiovascular;  Laterality: N/A;   TEE WITHOUT CARDIOVERSION N/A 06/28/2021   Procedure: TRANSESOPHAGEAL ECHOCARDIOGRAM (TEE);  Surgeon: Perla Evalene PARAS, MD;  Location: [facility] ORS;  Service: Cardiovascular;  Laterality: N/A;   TEE WITHOUT CARDIOVERSION N/A 07/04/2021   Procedure: TRANSESOPHAGEAL ECHOCARDIOGRAM (TEE);  Surgeon: Rolan Ezra RAMAN, MD;  Location: [facility] ENDOSCOPY;  Service: Cardiovascular;  Laterality: N/A;   TRIGGER FINGER RELEASE     Family History  Problem Relation Age of Onset   Heart disease Mother    Hypertension Mother    COPD Mother        was a smoker   Thyroid  disease Mother    Hypertension Father    Hypertension Sister    Thyroid  disease Sister    Breast cancer Sister    Hypertension Sister    Thyroid  disease Sister    Bone cancer Sister    Stroke Son    Breast cancer Maternal Aunt    Kidney disease Neg Hx    Bladder Cancer Neg Hx    Social History   Socioeconomic History   Marital status: Divorced    Spouse name: Not on file   Number of children: 4   Years of education: 12   Highest education level: Associate degree: occupational, Scientist, product/process development, or vocational program  Occupational History   Occupation: Retired- factory work    Comment: exposed to Interior and spatial designer  Tobacco Use   Smoking status: Former    Current packs/day: 0.00    Average packs/day: 0.3 packs/day for 2.0 years (0.6 ttl pk-yrs)    Types: Cigarettes    Start date: 06/23/1988    Quit date: 06/23/1990    Years since quitting: 33.7    Passive exposure: Past   Smokeless tobacco: Never   Tobacco comments:    Former smoker 10/08/23  Vaping Use   Vaping status: Never Used  Substance and Sexual Activity   Alcohol use: No   Drug use: No   Sexual activity: Not Currently    Birth control/protection: Surgical  Other Topics Concern   Not  on file  Social History Narrative   Lives in [place] alone. Has 4 children.   Daughter lives 8 minutes away. No pets.      Work - Hotel manager, retired      Diet - regular      Remote hx of smoking > 30 years; No alcohol-    Social Drivers of Corporate investment banker Strain: Low Risk  (03/15/2024)   Overall Financial Resource Strain (CARDIA)    Difficulty of Paying Living Expenses: Not hard at all  Food Insecurity: No Food Insecurity (03/15/2024)   Hunger Vital Sign    Worried About Running Out of Food in the Last Year: Never true    Ran Out of Food in the Last Year: Never true  Transportation Needs: No Transportation Needs (03/15/2024)   PRAPARE - Administrator, Civil Service (Medical): No    Lack of Transportation (Non-Medical): No  Recent Concern: Transportation Needs - Unmet Transportation Needs (01/18/2024)   PRAPARE - Transportation    Lack of Transportation (Medical): Yes    Lack of Transportation (Non-Medical): Yes  Physical Activity: Inactive (03/15/2024)   Exercise Vital Sign    Days of Exercise per Week: 0 days    Minutes of Exercise per Session: 0 min  Stress: No Stress Concern Present (03/15/2024)   [employer] of Occupational Health - Occupational Stress Questionnaire    Feeling of Stress: Not at all  Social Connections: Socially Isolated (03/15/2024)   Social Connection and Isolation Panel    Frequency of Communication with Friends and Family: More than three times a week    Frequency of Social Gatherings with Friends and Family: More than three times a week    Attends Religious Services: Never    Database administrator or Organizations: No    Attends Banker Meetings: Never    Marital Status: Widowed    Tobacco Counseling Counseling given: Not Answered Tobacco comments: Former smoker 10/08/23    Clinical Intake:  Pre-visit preparation completed: Yes  Pain : 0-10 Pain Score: 5  Pain Type: Acute pain Pain Location: Back Pain Orientation: Lower Pain Descriptors / Indicators: Dull Pain Onset: 1 to  4 weeks ago Pain Frequency: Intermittent     BMI - recorded: 35.54 Nutritional Status: BMI > 30  Obese Nutritional Risks: Nausea/ vomitting/ diarrhea Diabetes: Yes CBG done?: No  Lab Results  Component Value Date   HGBA1C 5.1 07/18/2023   HGBA1C 5.4 06/25/2021   HGBA1C 5.6 04/01/2021     How often do you need to have someone help you when you read instructions, pamphlets, or other written materials from your doctor or pharmacy?: 1 - Never  Interpreter Needed?: No  Information entered by :: R. Artur Winningham LPN   Activities of Daily Living     03/15/2024    1:44 PM 09/29/2023    2:59 PM  In your present state of health, do you have any difficulty performing the following activities:  Hearing? 0   Vision? 0   Difficulty concentrating or making decisions? 0   Walking or climbing stairs? 1   Dressing or bathing? 0   Doing errands, shopping? 1 1  Comment  daughter Insurance claims handler and eating ? N   Using the Toilet? N   In the past six months, have you accidently leaked urine? Y   Do you have problems with loss of bowel control? Y   Managing your Medications? N   Managing your Finances?  N   Housekeeping or managing your Housekeeping? Y     Patient Care Team: Bair, Kalpana, MD as PCP - General (Family Medicine) Darron Deatrice LABOR, MD as PCP - Cardiology (Cardiology) Cindie Ole DASEN, MD as PCP - Electrophysiology (Clinical Cardiac Electrophysiology) Lenord Sensing, MD as Referring Physician (Allergy) Alaine Vicenta NOVAK, MD as Referring Physician (Internal Medicine) Rennie Cindy SAUNDERS, MD as Consulting Physician (Oncology) Arno Rosaline SQUIBB, RN as Registered Nurse Devra Lands, RN as [facility] Care Management Riddle, Suzann, NP as Nurse Practitioner (Clinical Cardiac Electrophysiology)  I have updated your Care Teams any recent Medical Services you may have received from other providers in the past year.     Assessment:   This is a routine wellness examination  for Debra Saunders.  Hearing/Vision screen Hearing Screening - Comments:: No issues Vision Screening - Comments:: glasses   Goals Addressed             This Visit's Progress    Patient Stated       Wants to lose some weight       Depression Screen     03/15/2024    1:50 PM 02/23/2024    1:44 PM 02/09/2024    3:33 PM 02/08/2024    4:26 PM 01/25/2024    2:07 PM 12/29/2023    2:24 PM 10/06/2023   11:35 AM  PHQ 2/9 Scores  PHQ - 2 Score 0 0 0  0 0 0  PHQ- 9 Score 8     2 3   Exception Documentation    Medical reason       Fall Risk     03/15/2024    1:47 PM 02/23/2024    1:33 PM 02/08/2024    4:26 PM 01/25/2024    2:03 PM 12/29/2023    2:24 PM  Fall Risk   Falls in the past year? 0 0 0 0 0  Number falls in past yr: 0 0 0 0 0  Injury with Fall? 0 0 0 0 0  Risk for fall due to : No Fall Risks Impaired balance/gait;Impaired mobility Impaired balance/gait;Impaired mobility;Medication side effect No Fall Risks No Fall Risks  Follow up Falls evaluation completed;Falls prevention discussed Falls evaluation completed;Falls prevention discussed Falls evaluation completed;Falls prevention discussed Falls evaluation completed;Education provided;Falls prevention discussed Falls evaluation completed  Comment  Anticoagulent - ED for head bumps       MEDICARE RISK AT HOME:  Medicare Risk at Home Any stairs in or around the home?: Yes If so, are there any without handrails?: No Home free of loose throw rugs in walkways, pet beds, electrical cords, etc?: Yes Adequate lighting in your home to reduce risk of falls?: Yes Life alert?: Yes Use of a cane, walker or w/c?: Yes Grab bars in the bathroom?: No Shower chair or bench in shower?: Yes Elevated toilet seat or a handicapped toilet?: No  TIMED UP AND GO:  Was the test performed?  No  Cognitive Function: 6CIT completed        03/15/2024    1:55 PM 03/09/2023    2:02 PM 02/19/2022    2:22 PM 12/15/2019    2:32 PM 07/02/2018    2:26 PM  6CIT  Screen  What Year? 0 points 0 points 0 points 0 points 0 points  What month? 0 points 0 points 0 points 0 points 0 points  What time? 0 points 0 points 0 points 0 points 0 points  Count back from 20 0 points 0 points 0 points  0 points  Months in reverse 0 points 0 points 0 points 0 points 0 points  Repeat phrase 4 points 0 points 0 points  0 points  Total Score 4 points 0 points 0 points  0 points    Immunizations Immunization History  Administered Date(s) Administered   Fluad Quad(high Dose 65+) 07/08/2019, 04/10/2020, 04/01/2021   INFLUENZA, HIGH DOSE SEASONAL PF 07/23/2016, 06/15/2017, 07/02/2018   Influenza, Seasonal, Injecte, Preservative Fre 07/27/2012   Influenza,inj,Quad PF,6+ Mos 06/10/2013, 05/01/2014, 03/19/2015   PFIZER(Purple Top)SARS-COV-2 Vaccination 09/23/2019, 10/17/2019   PPD Test 02/18/2013, 03/04/2013   Pneumococcal Conjugate-13 10/06/2013   Pneumococcal Polysaccharide-23 04/28/2012   Td 08/03/2013    Screening Tests Health Maintenance  Topic Date Due   Zoster Vaccines- Shingrix (1 of 2) Never done   COVID-19 Vaccine (3 - Pfizer risk series) 11/14/2019   FOOT EXAM  11/03/2020   DTaP/Tdap/Td (2 - Tdap) 08/04/2023   OPHTHALMOLOGY EXAM  12/09/2023   HEMOGLOBIN A1C  01/15/2024   Influenza Vaccine  01/22/2024   Medicare Annual Wellness (AWV)  03/08/2024   Diabetic kidney evaluation - Urine ACR  11/23/2024   Diabetic kidney evaluation - eGFR measurement  01/17/2025   Pneumococcal Vaccine: 50+ Years  Completed   DEXA SCAN  Completed   HPV VACCINES  Aged Out   Meningococcal B Vaccine  Aged Out   Colonoscopy  Discontinued   Hepatitis C Screening  Discontinued    Health Maintenance Items Addressed: Discussed the need to update covid, flu and tetanus (Tdap) vaccines. Patient declines shingles vaccines.  Patient needs a diabetic foot exam and A1C at next office visit.    Additional Screening:  Vision Screening: Recommended annual ophthalmology exams for  early detection of glaucoma and other disorders of the eye. Is the patient up to date with their annual eye exam?  No  Who is the provider or what is the name of the office in which the patient attends annual eye exams?  [facility]  Patient was advised that she needs to call and schedule a diabetic eye exam.  Dental Screening: Recommended annual dental exams for proper oral hygiene  Community Resource Referral / Chronic Care Management: CRR required this visit?  No   CCM required this visit?  No   Plan:    I have personally reviewed and noted the following in the patient's chart:   Medical and social history Use of alcohol, tobacco or illicit drugs  Current medications and supplements including opioid prescriptions. Patient is not currently taking opioid prescriptions. Functional ability and status Nutritional status Physical activity Advanced directives List of other physicians Hospitalizations, surgeries, and ER visits in previous 12 months Vitals Screenings to include cognitive, depression, and falls Referrals and appointments  In addition, I have reviewed and discussed with patient certain preventive protocols, quality metrics, and best practice recommendations. A written personalized care plan for preventive services as well as general preventive health recommendations were provided to patient.   Angeline Fredericks, LPN   [id number]   After Visit Summary: (MyChart) Due to this being a telephonic visit, the after visit summary with patients personalized plan was offered to patient via MyChart   Notes: Nothing significant to report at this time.  Phone note sent to PCP. Patient needs a diabetic foot exam and A1C at next office visit

## 2024-03-15 NOTE — Telephone Encounter (Signed)
 Called and spoke with patient's daughter, as her phone is the contact information in patient's chart. Called to follow up with telephone encounter we received in regards to the back pain patient was or is experiencing. Daughter states the patient has had a history of back pain to where her back will randomly give out on her. Daughter states the patient has had episodes before where she is not able to move or get out of the bed to where she has to use a bed pan to use the restroom. Daughter states the patient has not had as bad of an episode this time as the patient has been able to walk to the bathroom and kitchen but not stand for long. Patient was also able to sit up in her recliner on Sunday. Daughter was offered to make an appointment for the patient, but says the patient will not come in as she cannot get in. Advised daughter to reach out to the non emergent line to see if they can come and see the patient at her home to evaluate her, but says she currently works and the patient will not allow them in her home without her daughter being present. Daughter was made aware that Dr Abbey will be notified to advise. Verbalized understanding.

## 2024-03-15 NOTE — Telephone Encounter (Signed)
 Noted.  [name], MD

## 2024-03-15 NOTE — Telephone Encounter (Signed)
 Patient daughter Dedra was notified and verbalized understanding. Dedra states she reached out to her mother as a Financial planner. Patient declined an appointment and was in the kitchen at the moment fixing a sandwich. Dedra states she will keep an eye out on her mother and if it gets worse she will taken her to be evaluated.

## 2024-03-15 NOTE — Patient Instructions (Signed)
 [name],  Thank you for taking the time for your Medicare Wellness Visit. I appreciate your continued commitment to your health goals. Please review the care plan we discussed, and feel free to reach out if I can assist you further.  Medicare recommends these wellness visits once per year to help you and your care team stay ahead of potential health issues. These visits are designed to focus on prevention, allowing your provider to concentrate on managing your acute and chronic conditions during your regular appointments.  Please note that Annual Wellness Visits do not include a physical exam. Some assessments may be limited, especially if the visit was conducted virtually. If needed, we may recommend a separate in-person follow-up with your provider.  Ongoing Care Seeing your primary care provider every 3 to 6 months helps us  monitor your health and provide consistent, personalized care.  Remember to call and schedule a diabetic eye exam.  Consider updating your covid, flu and tetanus (Tdap) vaccines.   Referrals If a referral was made during today's visit and you haven't received any updates within two weeks, please contact the referred provider directly to check on the status.  Recommended Screenings:  Health Maintenance  Topic Date Due   Zoster (Shingles) Vaccine (1 of 2) Never done   COVID-19 Vaccine (3 - Pfizer risk series) 11/14/2019   Complete foot exam   11/03/2020   DTaP/Tdap/Td vaccine (2 - Tdap) 08/04/2023   Eye exam for diabetics  12/09/2023   Hemoglobin A1C  01/15/2024   Flu Shot  01/22/2024   Yearly kidney health urinalysis for diabetes  11/23/2024   Yearly kidney function blood test for diabetes  01/17/2025   Medicare Annual Wellness Visit  03/15/2025   Pneumococcal Vaccine for age over 32  Completed   DEXA scan (bone density measurement)  Completed   HPV Vaccine  Aged Out   Meningitis B Vaccine  Aged Out   Colon Cancer Screening  Discontinued   Hepatitis C  Screening  Discontinued       03/15/2024    1:54 PM  Advanced Directives  Does Patient Have a Medical Advance Directive? Yes  Type of Estate agent of [place];Living will  Copy of Healthcare Power of Attorney in Chart? No - copy requested  Would patient like information on creating a medical advance directive? No - Patient declined   Advance Care Planning is important because it: Ensures you receive medical care that aligns with your values, goals, and preferences. Provides guidance to your family and loved ones, reducing the emotional burden of decision-making during critical moments.  Vision: Annual vision screenings are recommended for early detection of glaucoma, cataracts, and diabetic retinopathy. These exams can also reveal signs of chronic conditions such as diabetes and high blood pressure.  Dental: Annual dental screenings help detect early signs of oral cancer, gum disease, and other conditions linked to overall health, including heart disease and diabetes.  Please see the attached documents for additional preventive care recommendations.

## 2024-03-15 NOTE — Telephone Encounter (Signed)
 Noted. Patient has significant cardiovascular history. She needs to be evaluated in the ED for ongoing concerns including repeat labs, imaging.   Please recommend prompt ED evaluation.  Thank you,  Luke Shade, MD

## 2024-03-15 NOTE — Telephone Encounter (Signed)
 Performed AWV. While on the phone with the patient she stated that she started with back pain over a week ago. Patient stated that it was so bad that she could barely move. Patient stated that she has chronic diarrhea. Patient stated that she has had nausea off and on since Sunday. Patient stated the pain has eased up a little. Patient stated that she has not gone for medical care because she does not feel that she can get to her daughter's fleeta and get into it. Patient was advised that she really needs to be seen for her symptoms.  Reaching out to the office for them to contact the patient.

## 2024-03-19 ENCOUNTER — Other Ambulatory Visit: Payer: Self-pay | Admitting: Infectious Diseases

## 2024-03-22 ENCOUNTER — Other Ambulatory Visit: Payer: Self-pay

## 2024-03-22 NOTE — Patient Instructions (Signed)
 Visit Information  Thank you for taking time to visit with me today. Please don't hesitate to contact me if I can be of assistance to you before our next scheduled appointment.  Your next care management appointment is by telephone on 04/19/2024 at 1:00 pm  Telephone follow-up in 1 month  Please call the care guide team at [phone] if you need to cancel, schedule, or reschedule an appointment.   Please call the Suicide and Crisis Lifeline: 988 call the [place]  National Suicide Prevention Lifeline: 804-245-1813 or TTY: 850-489-3231 TTY 365-717-8807) to talk to a trained counselor call 1-800-273-TALK (toll free, 24 hour hotline) go to [facility] [address], [place] [phone]) call 911 if you are experiencing a Mental Health or Behavioral Health Crisis or need someone to talk to.  Debra Duos, MSN, RN [place]  [facility], [facility] RN Care Manager Direct Dial: [phone] Fax: [phone]     Insomnia Insomnia is a sleep disorder that makes it difficult to fall asleep or stay asleep. Insomnia can cause fatigue, low energy, difficulty concentrating, mood swings, and poor performance at work or school. There are three different ways to classify insomnia: Difficulty falling asleep. Difficulty staying asleep. Waking up too early in the morning. Any type of insomnia can be long-term (chronic) or short-term (acute). Both are common. Short-term insomnia usually lasts for 3 months or less. Chronic insomnia occurs at least three times a week for longer than 3 months. What are the causes? Insomnia may be caused by another condition, situation, or substance, such as: Having certain mental health conditions, such as anxiety and depression. Using caffeine , alcohol, tobacco, or drugs. Having gastrointestinal conditions, such as gastroesophageal reflux disease (GERD). Having certain medical conditions. These  include: Asthma. Alzheimer's disease. Stroke. Chronic pain. An overactive thyroid  gland (hyperthyroidism). Other sleep disorders, such as restless legs syndrome and sleep apnea. Menopause. Sometimes, the cause of insomnia may not be known. What increases the risk? Risk factors for insomnia include: Gender. Females are affected more often than males. Age. Insomnia is more common as people get older. Stress and certain medical and mental health conditions. Lack of exercise. Having an irregular work schedule. This may include working night shifts and traveling between different time zones. What are the signs or symptoms? If you have insomnia, the main symptom is having trouble falling asleep or having trouble staying asleep. This may lead to other symptoms, such as: Feeling tired or having low energy. Feeling nervous about going to sleep. Not feeling rested in the morning. Having trouble concentrating. Feeling irritable, anxious, or depressed. How is this diagnosed? This condition may be diagnosed based on: Your symptoms and medical history. Your health care provider may ask about: Your sleep habits. Any medical conditions you have. Your mental health. A physical exam. How is this treated? Treatment for insomnia depends on the cause. Treatment may focus on treating an underlying condition that is causing the insomnia. Treatment may also include: Medicines to help you sleep. Counseling or therapy. Lifestyle adjustments to help you sleep better. Follow these instructions at home: Eating and drinking  Limit or avoid alcohol, caffeinated beverages, and products that contain nicotine and tobacco, especially close to bedtime. These can disrupt your sleep. Do not eat a large meal or eat spicy foods right before bedtime. This can lead to digestive discomfort that can make it hard for you to sleep. Sleep habits  Keep a sleep diary to help you and your health care provider  figure out  what could be causing your insomnia. Write down: When you sleep. When you wake up during the night. How well you sleep and how rested you feel the next day. Any side effects of medicines you are taking. What you eat and drink. Make your bedroom a dark, comfortable place where it is easy to fall asleep. Put up shades or blackout curtains to block light from outside. Use a white noise machine to block noise. Keep the temperature cool. Limit screen use before bedtime. This includes: Not watching TV. Not using your smartphone, tablet, or computer. Stick to a routine that includes going to bed and waking up at the same times every day and night. This can help you fall asleep faster. Consider making a quiet activity, such as reading, part of your nighttime routine. Try to avoid taking naps during the day so that you sleep better at night. Get out of bed if you are still awake after 15 minutes of trying to sleep. Keep the lights down, but try reading or doing a quiet activity. When you feel sleepy, go back to bed. General instructions Take over-the-counter and prescription medicines only as told by your health care provider. Exercise regularly as told by your health care provider. However, avoid exercising in the hours right before bedtime. Use relaxation techniques to manage stress. Ask your health care provider to suggest some techniques that may work well for you. These may include: Breathing exercises. Routines to release muscle tension. Visualizing peaceful scenes. Make sure that you drive carefully. Do not drive if you feel very sleepy. Keep all follow-up visits. This is important. Contact a health care provider if: You are tired throughout the day. You have trouble in your daily routine due to sleepiness. You continue to have sleep problems, or your sleep problems get worse. Get help right away if: You have thoughts about hurting yourself or someone else. Get help right away if you  feel like you may hurt yourself or others, or have thoughts about taking your own life. Go to your nearest emergency room or: Call 911. Call the National Suicide Prevention Lifeline at 515-430-6560 or 988. This is open 24 hours a day. Text the Crisis Text Line at (360) 720-1831. Summary Insomnia is a sleep disorder that makes it difficult to fall asleep or stay asleep. Insomnia can be long-term (chronic) or short-term (acute). Treatment for insomnia depends on the cause. Treatment may focus on treating an underlying condition that is causing the insomnia. Keep a sleep diary to help you and your health care provider figure out what could be causing your insomnia. This information is not intended to replace advice given to you by your health care provider. Make sure you discuss any questions you have with your health care provider. Document Revised: 05/20/2021 Document Reviewed: 05/20/2021 Elsevier Patient Education  2024 [employer].

## 2024-03-22 NOTE — Patient Outreach (Signed)
 Complex Care Management   Visit Note  03/22/2024  Name:  Debra Saunders MRN: [id number] DOB: 1946-06-07  Situation: Referral received for Complex Care Management related to Heart Failure I obtained verbal consent from Patient.  Visit completed with Patient  on the phone  Background:   Past Medical History:  Diagnosis Date   Abdominal pain 07/06/2021   Acute hypokalemia 07/18/2023   Acute on chronic heart failure with preserved ejection fraction (HFpEF) (HCC) 07/17/2023   Arthritis    Atrial fibrillation, persistent (HCC)    a. s/p TEE-guided DCCV on 08/20/2017; b. 06/2021 Recurrent Afib-->amio/TEE/DCCV.   CAD (coronary artery disease)    a. 01/2012 Cath: nonobs dzs; b. 04/2014 MV: No ischemia; c. 09/2017 Cath: Minimal nonobs RCA dzs, otw nl cors. 09/2023: Tikosyn  loading   Cardiorenal syndrome    a. 09/2017 Creat rose to 2.7 w/ diuresis-->HD x 2.   Chronic back pain    Chronic combined systolic (congestive) and diastolic (congestive) heart failure (HCC)    a. Dx 2005 @ [facility];  b. 02/2014 Echo: EF 55-60%; c. 07/2017: EF 30-35%; d. 09/2017 Echo: EF 40-45%; e. 02/2018 Echo: EF 55-60%; f. 08/2019 Echo: EF 60-65%; g. 08/2020 RHC: RA 3, RV 30/1, PA 33/11 (20), PCWP 6; f. 08/2021 Echo: EF 60-65%, no rwma, GrIDD, nl RV fxn, RVSP 40.40mmHg, mod dil LA, mod TR.   Chronic respiratory failure (HCC)    a. on home O2   Gastritis    History of COVID-19    History of endocarditis 08/22/2021   History of intracranial hemorrhage    a. 6/14 described by neurosurgery as small frontal posttraumatic SAH  - pt was on xarelto  @ the time.   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 Chronic LLE edema.   NICM (nonischemic cardiomyopathy) (HCC)    a. 09/2017 Echo: EF 40-45%; b. 09/2017 Cath: minor, insignificant CAD; c. 02/2018 Echo: EF 55-60%, Gr1 DD; d. 08/2019 Echo: EF 60-65%, Gr2 DD.   Non-ST  elevation (NSTEMI) myocardial infarction (HCC) 07/18/2023   Obesity    PAH (pulmonary artery hypertension) (HCC)    a. 09/2017 Echo: PASP ; b. 09/2017 RHC: RA 23, RV 84/13, PA 84/29, PCWP 20, CO 3.89, CI 1.89. LVEDP 18; c. 08/2020 RHC: RA 3, RV 30/1, PA 33/11 (20), PCWP 6. CO/CI 6.18/3.36. PVR 2.3 WU.   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 06/2021. Veg on PPM lead. Not candidate for extraction-->s/p 6 wks abx.   Sepsis with metabolic encephalopathy (HCC)    Sleep apnea    Streptococcal bacteremia    Stroke [facility]) 2014   Subarachnoid hemorrhage (HCC) 01/19/2013   Symptomatic bradycardia    a. s/p BSX PPM.   Thyroid  nodule    Type II diabetes mellitus (HCC)    Urinary incontinence     Assessment: Patient Reported Symptoms:  Cognitive Cognitive Status: No symptoms reported Cognitive/Intellectual Conditions Management [RPT]: None reported or documented in medical history or problem list   Health Maintenance Behaviors: Annual physical exam, Healthy diet, Sleep adequate Healing Pattern: Unsure  Neurological Neurological Review of Symptoms: Dizziness, Headaches Neurological Comment: reports dizziness with hot flashes started in August but worse now in intesity and frequency - will have daughter make acute appointment, red flags for ED  HEENT HEENT Symptoms Reported: No  symptoms reported HEENT Management Strategies: Routine screening    Cardiovascular Cardiovascular Symptoms Reported: No symptoms reported Cardiovascular Management Strategies: Medication therapy, Routine screening Weight: 184 lb 12.8 oz (83.8 kg) Cardiovascular Comment: denies HF/afib - verbalized her sx being SOB not being able to walk as far - aware HF Action Plan and follows  Respiratory Respiratory Symptoms Reported: Shortness of breath Other Respiratory Symptoms: O2 4L continuous, no worsening sx, PO2 - reports good but still less than 90% with activity  and when forst get up in AM, improves with rest, Red flags for ED, Respiratory Management Strategies: CPAP, Oxygen  therapy, Routine screening  Endocrine Endocrine Symptoms Reported: No symptoms reported Is patient diabetic?: No    Gastrointestinal Gastrointestinal Symptoms Reported: Diarrhea Additional Gastrointestinal Details: diarrhea ongoing no change, every time she eats - right away, GI 12/15, stays hydrated Gastrointestinal Management Strategies: Incontinence garment/pad    Genitourinary Genitourinary Symptoms Reported: No symptoms reported    Integumentary Integumentary Symptoms Reported: No symptoms reported    Musculoskeletal Musculoskelatal Symptoms Reviewed: Weakness, Limited mobility Additional Musculoskeletal Details: rollator, generalized weakness and limited due to DOE Musculoskeletal Management Strategies: Medication therapy, Medical device, Routine screening Falls in the past year?: No Number of falls in past year: 1 or less Was there an injury with Fall?: No Fall Risk Category Calculator: 0 Patient Fall Risk Level: Low Fall Risk Patient at Risk for Falls Due to: Impaired mobility, Impaired balance/gait Fall risk Follow up: Falls evaluation completed, Falls prevention discussed  Psychosocial Psychosocial Symptoms Reported: No symptoms reported Other Psychosocial Conditions: discucussed sleep hygeine and developing routine - TV off at night and replace with music          03/22/2024    PHQ2-9 Depression Screening   Little interest or pleasure in doing things Not at all  Feeling down, depressed, or hopeless Not at all  PHQ-2 - Total Score 0  Trouble falling or staying asleep, or sleeping too much    Feeling tired or having little energy    Poor appetite or overeating     Feeling bad about yourself - or that you are a failure or have let yourself or your family down    Trouble concentrating on things, such as reading the newspaper or watching television    Moving  or speaking so slowly that other people could have noticed.  Or the opposite - being so fidgety or restless that you have been moving around a lot more than usual    Thoughts that you would be better off dead, or hurting yourself in some way    PHQ2-9 Total Score    If you checked off any problems, how difficult have these problems made it for you to do your work, take care of things at home, or get along with other people    Depression Interventions/Treatment      There were no vitals filed for this visit.  Medications Reviewed Today     Reviewed by Devra Lands, RN (Registered Nurse) on 03/22/24 at 1321  Med List Status: <None>   Medication Order Taking? Sig Documenting Provider Last Dose Status Informant  acetaminophen  (TYLENOL ) 325 MG tablet [id number] Yes Take 325-650 mg by mouth every 6 (six) hours as needed for mild pain. [provider]  Active Self           Med Note EFRAIM, TYELISHA L   Tue Sep 29, 2023  2:49 PM)    albuterol  (ACCUNEB ) 0.63 MG/3ML nebulizer solution [id number] Yes Take 1 ampule  by nebulization every 6 (six) hours as needed for wheezing. [provider]  Active Self  apixaban  (ELIQUIS ) 5 MG TABS tablet [id number] Yes Take 1 tablet (5 mg total) by mouth 2 (two) times daily. RESUME SUNDAY pm Fernande Elspeth BROCKS, MD  Active Self  atropine  1 % ophthalmic solution [id number] Yes Place 1 drop into both eyes at bedtime. [provider]  Active Self  cefadroxil  (DURICEF) 500 MG capsule [id number] Yes Take 1 capsule by mouth twice daily Ravishankar, Donald, MD  Active   dapagliflozin  propanediol (FARXIGA ) 10 MG TABS tablet [id number] Yes Take 1 tablet (10 mg total) by mouth daily before breakfast. Riddle, Suzann, NP  Active   diclofenac  Sodium (VOLTAREN ) 1 % topical gel 2 g [id number]   Bair, Kalpana, MD  Active   dofetilide  (TIKOSYN ) 250 MCG capsule [id number] Yes Take 1 capsule (250 mcg total) by mouth 2 (two) times daily. Riddle, Suzann, NP  Active    gabapentin  (NEURONTIN ) 600 MG tablet [id number] Yes Take 1 tablet (600 mg total) by mouth at bedtime. Bair, Luke, MD  Active   macitentan  (OPSUMIT ) 10 MG tablet [id number] Yes Take 1 tablet (10 mg total) by mouth daily. Rolan Ezra RAMAN, MD  Active Self  meclizine  (ANTIVERT ) 25 MG tablet [id number] Yes Take 1 tablet (25 mg total) by mouth 3 (three) times daily as needed for dizziness. Rolan Ezra RAMAN, MD  Active Self  Menthol , Topical Analgesic, (BIOFREEZE) 10 % CREA [id number]  Apply 1 Application topically daily as needed (apply to back for pain). [provider]  Active   midodrine  (PROAMATINE ) 5 MG tablet [id number] Yes Take 1 tablet (5 mg total) by mouth 3 (three) times daily with meals. Zenaida Morene PARAS, MD  Active   OXYGEN  [id number] Yes Inhale 4 L/min into the lungs continuous. [provider]  Active   potassium chloride  SA (KLOR-CON  M) 20 MEQ tablet [id number] Yes TAKE 2 TABLETS BY MOUTH IN THE MORNING AND 1 IN THE EVENING. PLEASE SCHEDULE APPOINTMENT FOR MORE REFILLS Rolan Ezra RAMAN, MD  Active   prednisoLONE  acetate (PRED FORTE ) 1 % ophthalmic suspension [id number] Yes Place 1 drop into both eyes 2 (two) times daily. [provider]  Active   rosuvastatin  (CRESTOR ) 10 MG tablet [id number] Yes Take 1 tablet (10 mg total) by mouth daily. Rolan Ezra RAMAN, MD  Active   sildenafil  (REVATIO ) 20 MG tablet [id number] Yes Take 1 tablet (20 mg total) by mouth 3 (three) times daily. Rolan Ezra RAMAN, MD  Active   spironolactone  (ALDACTONE ) 25 MG tablet [id number] Yes Take 1 tablet by mouth daily. [provider]  Active Self  torsemide  (DEMADEX ) 20 MG tablet [id number] Yes Take 3 tablets (60 mg total) by mouth daily. Rolan Ezra RAMAN, MD  Active   UPTRAVI  1600 MCG TABS [id number] Yes Take 1 tablet twice daily. Rolan Ezra RAMAN, MD  Active   Vitamin D , Ergocalciferol , (DRISDOL ) 1.25 MG (50000 UNIT) CAPS capsule [id number] Yes Take 1 capsule (50,000 Units total) by mouth  every 7 (seven) days. Abbey Luke, MD  Active             Recommendation:   PCP Follow-up Acute PCP follow-up Please have daughter call and make appointment for hot flashes with dizziness. Continue Current Plan of Care  Follow Up Plan:   Telephone follow-up in 1 month  [name], MSN, RN [facility]  [facility], [facility] RN Care Manager Direct Dial: [phone] Fax: [phone]

## 2024-03-29 ENCOUNTER — Ambulatory Visit (INDEPENDENT_AMBULATORY_CARE_PROVIDER_SITE_OTHER): Admitting: Vascular Surgery

## 2024-03-29 ENCOUNTER — Encounter (INDEPENDENT_AMBULATORY_CARE_PROVIDER_SITE_OTHER): Payer: Self-pay | Admitting: Vascular Surgery

## 2024-03-29 VITALS — Resp 18 | Ht 60.0 in | Wt 189.0 lb

## 2024-03-29 DIAGNOSIS — E1122 Type 2 diabetes mellitus with diabetic chronic kidney disease: Secondary | ICD-10-CM | POA: Diagnosis not present

## 2024-03-29 DIAGNOSIS — I890 Lymphedema, not elsewhere classified: Secondary | ICD-10-CM | POA: Diagnosis not present

## 2024-03-29 DIAGNOSIS — I10 Essential (primary) hypertension: Secondary | ICD-10-CM

## 2024-03-29 DIAGNOSIS — I82501 Chronic embolism and thrombosis of unspecified deep veins of right lower extremity: Secondary | ICD-10-CM | POA: Diagnosis not present

## 2024-03-29 NOTE — Assessment & Plan Note (Signed)
 blood glucose control important in reducing the progression of atherosclerotic disease. Also, involved in wound healing. On appropriate medications.

## 2024-03-29 NOTE — Assessment & Plan Note (Signed)
 The patient has chronic, longstanding left lower extremity lymphedema.  She has an IVC filter in place and had previous blood clots in his legs there is clearly a postphlebitic component.  She would benefit from wearing daily compression garments and elevating her leg is much as possible.  Using her lymphedema pump would also be of benefit.  If she develops skin breakdown or weeping of the tissue, I would recommend we get her back into place Unna boots.

## 2024-03-29 NOTE — Assessment & Plan Note (Signed)
 blood pressure control important in reducing the progression of atherosclerotic disease. On appropriate oral medications.

## 2024-03-29 NOTE — Progress Notes (Signed)
 Patient ID: Debra Saunders, female   DOB: Dec 21, 1945, 78 y.o.   MRN: [id number]  Chief Complaint  Patient presents with   Follow-up    Consult DVT done 02/18/24 DVT LLE Ref.Riddle    HPI JOLEIGH Saunders is a 78 y.o. female.  I am asked to see the patient by Suzann Riddle for evaluation of DVT in the right lower extremity.  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  She really does not have any significant pain or swelling in the right leg.  She has had longstanding marked left leg swelling.  She has a lymphedema pump but she has really not been using that much.  At times the swelling is too much to even get a compression sock on.  When she elevates her legs it does help the swelling some.  No open wounds or signs of infection.  No weeping of the tissue.  No fever or chills. When we had seen her in the past she had had did have dialysis catheters, but she has not required chronic long-term dialysis.   Past Medical History:  Diagnosis Date   Abdominal pain 07/06/2021   Acute hypokalemia 07/18/2023   Acute on chronic heart failure with preserved ejection fraction (HFpEF) (HCC) 07/17/2023   Arthritis    Atrial fibrillation, persistent (HCC)    a. s/p TEE-guided DCCV on 08/20/2017; b. 06/2021 Recurrent Afib-->amio/TEE/DCCV.   CAD (coronary artery disease)    a. 01/2012 Cath: nonobs dzs; b. 04/2014 MV: No ischemia; c. 09/2017 Cath: Minimal nonobs RCA dzs, otw nl cors. 09/2023: Tikosyn  loading   Cardiorenal syndrome    a. 09/2017 Creat rose to 2.7 w/ diuresis-->HD x 2.   Chronic back pain    Chronic combined systolic (congestive) and diastolic (congestive) heart failure (HCC)    a. Dx 2005 @ [facility];  b. 02/2014 Echo: EF 55-60%; c. 07/2017: EF 30-35%; d. 09/2017 Echo: EF 40-45%; e. 02/2018 Echo: EF 55-60%; f. 08/2019 Echo: EF 60-65%; g. 08/2020 RHC: RA 3, RV 30/1, PA 33/11 (20), PCWP 6; f. 08/2021 Echo: EF 60-65%, no rwma, GrIDD, nl RV fxn, RVSP 40.52mmHg, mod dil LA, mod TR.    Chronic respiratory failure (HCC)    a. on home O2   Gastritis    History of COVID-19    History of endocarditis 08/22/2021   History of intracranial hemorrhage    a. 6/14 described by neurosurgery as small frontal posttraumatic SAH  - pt was on xarelto  @ the time.   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 Chronic LLE edema.   NICM (nonischemic cardiomyopathy) (HCC)    a. 09/2017 Echo: EF 40-45%; b. 09/2017 Cath: minor, insignificant CAD; c. 02/2018 Echo: EF 55-60%, Gr1 DD; d. 08/2019 Echo: EF 60-65%, Gr2 DD.   Non-ST elevation (NSTEMI) myocardial infarction (HCC) 07/18/2023   Obesity    PAH (pulmonary artery hypertension) (HCC)    a. 09/2017 Echo: PASP ; b. 09/2017 RHC: RA 23, RV 84/13, PA 84/29, PCWP 20, CO 3.89, CI 1.89. LVEDP 18; c. 08/2020 RHC: RA 3, RV 30/1, PA 33/11 (20), PCWP 6. CO/CI 6.18/3.36. PVR 2.3 WU.   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 06/2021. Veg on PPM lead. Not candidate for extraction-->s/p 6 wks abx.  Sepsis with metabolic encephalopathy (HCC)    Sleep apnea    Streptococcal bacteremia    Stroke [facility]) 2014   Subarachnoid hemorrhage (HCC) 01/19/2013   Symptomatic bradycardia    a. s/p BSX PPM.   Thyroid  nodule    Type II diabetes mellitus (HCC)    Urinary incontinence     Past Surgical History:  Procedure Laterality Date   ABDOMINAL HYSTERECTOMY  1969   CARDIAC CATHETERIZATION  2013   @ [facility]: No obstructive CAD: Only 20% ostial left circumflex.   CARDIOVERSION N/A 10/12/2017   Procedure: CARDIOVERSION;  Surgeon: Perla Evalene PARAS, MD;  Location: [facility] ORS;  Service: Cardiovascular;  Laterality: N/A;   CARDIOVERSION N/A 07/04/2021   Procedure: CARDIOVERSION;  Surgeon: Rolan Ezra RAMAN, MD;  Location: [facility] ENDOSCOPY;  Service: Cardiovascular;  Laterality: N/A;   CARDIOVERSION  N/A 03/27/2023   Procedure: CARDIOVERSION;  Surgeon: Rolan Ezra RAMAN, MD;  Location: [facility] ORS;  Service: Cardiovascular;  Laterality: N/A;   CARDIOVERSION N/A 10/01/2023   Procedure: CARDIOVERSION;  Surgeon: Rolan Ezra RAMAN, MD;  Location: [facility] INVASIVE CV LAB;  Service: Cardiovascular;  Laterality: N/A;   CATARACT EXTRACTION W/PHACO Right 09/19/2020   Procedure: CATARACT EXTRACTION PHACO AND INTRAOCULAR LENS PLACEMENT (IOC) RIGHT DIABETIC 3.95 00:43.0 9.2%;  Surgeon: Mittie Gaskin, MD;  Location: [facility] SURGERY CNTR;  Service: Ophthalmology;  Laterality: Right;  Pt requests to be last sleep apnea   CATARACT EXTRACTION W/PHACO Left 10/03/2020   Procedure: CATARACT EXTRACTION PHACO AND INTRAOCULAR LENS PLACEMENT (IOC) LEFT DIABETIC;  Surgeon: Mittie Gaskin, MD;  Location: [facility] SURGERY CNTR;  Service: Ophthalmology;  Laterality: Left;  3.25 0:59.3 5.5%   CHOLECYSTECTOMY     COLONOSCOPY WITH PROPOFOL  N/A 09/10/2018   Procedure: COLONOSCOPY WITH PROPOFOL ;  Surgeon: Viktoria Lamar DASEN, MD;  Location: [facility] ENDOSCOPY;  Service: Endoscopy;  Laterality: N/A;   COLONOSCOPY WITH PROPOFOL  N/A 10/22/2022   Procedure: COLONOSCOPY WITH PROPOFOL ;  Surgeon: Onita Elspeth Sharper, DO;  Location: [facility] ENDOSCOPY;  Service: Gastroenterology;  Laterality: N/A;   CYST REMOVAL TRUNK  2002   BACK   DIALYSIS/PERMA CATHETER INSERTION N/A 10/06/2017   Procedure: DIALYSIS/PERMA CATHETER INSERTION;  Surgeon: Jama Cordella MATSU, MD;  Location: [facility] INVASIVE CV LAB;  Service: Cardiovascular;  Laterality: N/A;   ESOPHAGOGASTRODUODENOSCOPY (EGD) WITH PROPOFOL  N/A 10/21/2022   Procedure: ESOPHAGOGASTRODUODENOSCOPY (EGD) WITH PROPOFOL ;  Surgeon: Onita Elspeth Sharper, DO;  Location: [facility] ENDOSCOPY;  Service: Gastroenterology;  Laterality: N/A;   GIVENS CAPSULE STUDY N/A 10/22/2022   Procedure: GIVENS CAPSULE STUDY;  Surgeon: Onita Elspeth Sharper, DO;  Location: [facility] ENDOSCOPY;  Service: Gastroenterology;   Laterality: N/A;  IF NOTHING IS FOUND WITH COLONOSCOPY   INSERT / REPLACE / REMOVE PACEMAKER     lipoma removal  2014   back   PACEMAKER INSERTION  2015   [employer] dual chamber pacemaker implanted by Dr Fernande for symptomatic bradycardia   PERMANENT PACEMAKER INSERTION N/A 01/09/2014   Procedure: PERMANENT PACEMAKER INSERTION;  Surgeon: Elspeth JAYSON Fernande, MD;  Location: [facility] CATH LAB;  Service: Cardiovascular;  Laterality: N/A;   PPM GENERATOR CHANGEOUT N/A 04/01/2023   Procedure: PPM GENERATOR CHANGEOUT;  Surgeon: Fernande Elspeth JAYSON, MD;  Location: [facility] INVASIVE CV LAB;  Service: Cardiovascular;  Laterality: N/A;   RIGHT HEART CATH N/A 04/19/2018   Procedure: RIGHT HEART CATH;  Surgeon: Rolan Ezra RAMAN, MD;  Location: [facility] INVASIVE CV LAB;  Service: Cardiovascular;  Laterality: N/A;   RIGHT HEART CATH N/A 08/30/2020   Procedure: RIGHT HEART CATH;  Surgeon: Rolan Ezra  S, MD;  Location: [facility] INVASIVE CV LAB;  Service: Cardiovascular;  Laterality: N/A;   RIGHT HEART CATH N/A 02/11/2022   Procedure: RIGHT HEART CATH;  Surgeon: Rolan Ezra RAMAN, MD;  Location: [facility] INVASIVE CV LAB;  Service: Cardiovascular;  Laterality: N/A;   RIGHT/LEFT HEART CATH AND CORONARY ANGIOGRAPHY N/A 10/19/2017   Procedure: RIGHT/LEFT HEART CATH AND CORONARY ANGIOGRAPHY;  Surgeon: Darron Deatrice LABOR, MD;  Location: [facility] INVASIVE CV LAB;  Service: Cardiovascular;  Laterality: N/A;   RIGHT/LEFT HEART CATH AND CORONARY ANGIOGRAPHY N/A 07/20/2023   Procedure: RIGHT/LEFT HEART CATH AND CORONARY ANGIOGRAPHY;  Surgeon: Darron Deatrice LABOR, MD;  Location: [facility] INVASIVE CV LAB;  Service: Cardiovascular;  Laterality: N/A;   TEE WITHOUT CARDIOVERSION N/A 08/20/2017   Procedure: TRANSESOPHAGEAL ECHOCARDIOGRAM (TEE) & Direct current cardioversion;  Surgeon: Perla Evalene PARAS, MD;  Location: [facility] ORS;  Service: Cardiovascular;  Laterality: N/A;   TEE WITHOUT CARDIOVERSION N/A 06/28/2021   Procedure: TRANSESOPHAGEAL ECHOCARDIOGRAM (TEE);   Surgeon: Perla Evalene PARAS, MD;  Location: [facility] ORS;  Service: Cardiovascular;  Laterality: N/A;   TEE WITHOUT CARDIOVERSION N/A 07/04/2021   Procedure: TRANSESOPHAGEAL ECHOCARDIOGRAM (TEE);  Surgeon: Rolan Ezra RAMAN, MD;  Location: [facility] ENDOSCOPY;  Service: Cardiovascular;  Laterality: N/A;   TRIGGER FINGER RELEASE       Family History  Problem Relation Age of Onset   Heart disease Mother    Hypertension Mother    COPD Mother        was a smoker   Thyroid  disease Mother    Hypertension Father    Hypertension Sister    Thyroid  disease Sister    Breast cancer Sister    Hypertension Sister    Thyroid  disease Sister    Bone cancer Sister    Stroke Son    Breast cancer Maternal Aunt    Kidney disease Neg Hx    Bladder Cancer Neg Hx       Social History   Tobacco Use   Smoking status: Former    Current packs/day: 0.00    Average packs/day: 0.3 packs/day for 2.0 years (0.6 ttl pk-yrs)    Types: Cigarettes    Start date: 06/23/1988    Quit date: 06/23/1990    Years since quitting: 33.7    Passive exposure: Past   Smokeless tobacco: Never   Tobacco comments:    Former smoker 10/08/23  Vaping Use   Vaping status: Never Used  Substance Use Topics   Alcohol use: No   Drug use: No     Allergies  Allergen Reactions   Aspirin  Anaphylaxis and Hives    Pt states that she is unable to take because she had bleeding in her brain   Other Other (See Comments)    Pt states that she is unable to take blood thinners because she had bleeding in her brain; Blood Thinners-per doctor at [facility] (Xarelto )    Current Outpatient Medications  Medication Sig Dispense Refill   acetaminophen  (TYLENOL ) 325 MG tablet Take 325-650 mg by mouth every 6 (six) hours as needed for mild pain.     albuterol  (ACCUNEB ) 0.63 MG/3ML nebulizer solution Take 1 ampule by nebulization every 6 (six) hours as needed for wheezing.     apixaban  (ELIQUIS ) 5 MG TABS tablet Take 1 tablet (5 mg total) by mouth 2  (two) times daily. RESUME SUNDAY pm 180 tablet 3   atropine  1 % ophthalmic solution Place 1 drop into both eyes at bedtime.     cefadroxil  (DURICEF) 500  MG capsule Take 1 capsule by mouth twice daily 60 capsule 6   dapagliflozin  propanediol (FARXIGA ) 10 MG TABS tablet Take 1 tablet (10 mg total) by mouth daily before breakfast. 90 tablet 1   dofetilide  (TIKOSYN ) 250 MCG capsule Take 1 capsule (250 mcg total) by mouth 2 (two) times daily. 180 capsule 2   gabapentin  (NEURONTIN ) 600 MG tablet Take 1 tablet (600 mg total) by mouth at bedtime. 90 tablet 1   macitentan  (OPSUMIT ) 10 MG tablet Take 1 tablet (10 mg total) by mouth daily. 30 tablet 11   meclizine  (ANTIVERT ) 25 MG tablet Take 1 tablet (25 mg total) by mouth 3 (three) times daily as needed for dizziness. 30 tablet 1   Menthol , Topical Analgesic, (BIOFREEZE) 10 % CREA Apply 1 Application topically daily as needed (apply to back for pain).     midodrine  (PROAMATINE ) 5 MG tablet Take 1 tablet (5 mg total) by mouth 3 (three) times daily with meals. 90 tablet 5   OXYGEN  Inhale 4 L/min into the lungs continuous.     potassium chloride  SA (KLOR-CON  M) 20 MEQ tablet TAKE 2 TABLETS BY MOUTH IN THE MORNING AND 1 IN THE EVENING. PLEASE SCHEDULE APPOINTMENT FOR MORE REFILLS 90 tablet 0   prednisoLONE  acetate (PRED FORTE ) 1 % ophthalmic suspension Place 1 drop into both eyes 2 (two) times daily.     rosuvastatin  (CRESTOR ) 10 MG tablet Take 1 tablet (10 mg total) by mouth daily. 90 tablet 3   sildenafil  (REVATIO ) 20 MG tablet Take 1 tablet (20 mg total) by mouth 3 (three) times daily. 270 tablet 3   spironolactone  (ALDACTONE ) 25 MG tablet Take 1 tablet by mouth daily.     torsemide  (DEMADEX ) 20 MG tablet Take 3 tablets (60 mg total) by mouth daily. 200 tablet 3   UPTRAVI  1600 MCG TABS Take 1 tablet twice daily. 60 tablet 11   Vitamin D , Ergocalciferol , (DRISDOL ) 1.25 MG (50000 UNIT) CAPS capsule Take 1 capsule (50,000 Units total) by mouth every 7 (seven)  days. 12 capsule 3   Current Facility-Administered Medications  Medication Dose Route Frequency Provider Last Rate Last Admin   diclofenac  Sodium (VOLTAREN ) 1 % topical gel 2 g  2 g Topical QID           REVIEW OF SYSTEMS (Negative unless checked)  Constitutional: [] Weight loss  [] Fever  [] Chills Cardiac: [] Chest pain   [] Chest pressure   [] Palpitations   [] Shortness of breath when laying flat   [] Shortness of breath at rest   [] Shortness of breath with exertion. Vascular:  [] Pain in legs with walking   [] Pain in legs at rest   [] Pain in legs when laying flat   [] Claudication   [] Pain in feet when walking  [] Pain in feet at rest  [] Pain in feet when laying flat   [x] History of DVT   [x] Phlebitis   [x] Swelling in legs   [] Varicose veins   [] Non-healing ulcers Pulmonary:   [x] Uses home oxygen    [] Productive cough   [] Hemoptysis   [] Wheeze  [x] COPD   [] Asthma Neurologic:  [] Dizziness  [] Blackouts   [] Seizures   [] History of stroke   [] History of TIA  [] Aphasia   [] Temporary blindness   [] Dysphagia   [] Weakness or numbness in arms   [] Weakness or numbness in legs Musculoskeletal:  [] Arthritis   [] Joint swelling   [] Joint pain   [] Low back pain Hematologic:  [] Easy bruising  [] Easy bleeding   [] Hypercoagulable state   [x] Anemic  [] Hepatitis Gastrointestinal:  []   Blood in stool   [] Vomiting blood  [] Gastroesophageal reflux/heartburn   [] Abdominal pain Genitourinary:  [x] Chronic kidney disease   [] Difficult urination  [] Frequent urination  [] Burning with urination   [] Hematuria Skin:  [] Rashes   [] Ulcers   [] Wounds Psychological:  [] History of anxiety   []  History of major depression.    Physical Exam Resp 18   Ht 5' (1.524 m)   Wt 189 lb (85.7 kg)   BMI 36.91 kg/m  Gen:  WD/WN, NAD Head: [place]/AT, No temporalis wasting.  Ear/Nose/Throat: Hearing grossly intact, nares w/o erythema or drainage, oropharynx w/o Erythema/Exudate Eyes: Conjunctiva clear, sclera non-icteric  Neck: trachea midline.   No JVD.  Pulmonary:  Good air movement, respirations not labored on supplemental oxygen . Vascular:  Vessel Right Left  Radial Palpable Palpable                          DP 1+ NP  PT NP NP   Gastrointestinal:. No masses, surgical incisions, or scars. Musculoskeletal: M/S 5/5 throughout.  Extremities without ischemic changes.  No deformity or atrophy. 3+ LLE edema. Neurologic: Sensation grossly intact in extremities.  Symmetrical.  Speech is fluent. Motor exam as listed above. Psychiatric: Judgment intact, Mood & affect appropriate for pt's clinical situation. Dermatologic: No rashes or ulcers noted.  No cellulitis or open wounds.    Radiology No results found.  Labs Recent Results (from the past 2160 hours)  CUP PACEART REMOTE DEVICE CHECK     Status: None   Collection Time: 01/02/24  8:17 PM  Result Value Ref Range   Date Time Interrogation Session [phone]    Pulse Generator Manufacturer MERM    Pulse Gen Model [id number] Azure XT DR MRI    Pulse Gen Serial Number [id number]    Clinic Name [facility]    Implantable Pulse Generator Type Implantable Pulse Generator    Implantable Pulse Generator Implant Date [id number]    Implantable Lead Manufacturer MERM    Implantable Lead Model 5076 CapSureFix Novus    Implantable Lead Serial Number [id number]    Implantable Lead Implant Date 79849279    Implantable Lead Location Detail 1 UNKNOWN    Implantable Lead Location [id number]    Implantable Lead Connection Status [id number]    Implantable Lead Manufacturer MERM    Implantable Lead Model 5076 CapSureFix Novus    Implantable Lead Serial Number [id number]    Implantable Lead Implant Date 79849279    Implantable Lead Location Detail 1 UNKNOWN    Implantable Lead Location [id number]    Implantable Lead Connection Status [id number]    Lead Channel Setting Sensing Sensitivity 0.9 mV   Lead Channel Setting Pacing Amplitude 2 V   Lead Channel Setting Pacing Pulse Width 0.4 ms   Lead  Channel Setting Pacing Amplitude 2 V   Zone Setting Status [id number]    Zone Setting Status [phone]    Lead Channel Impedance Value 323 ohm   Lead Channel Impedance Value 285 ohm   Lead Channel Sensing Intrinsic Amplitude 1.375 mV   Lead Channel Sensing Intrinsic Amplitude 1.375 mV   Lead Channel Impedance Value 494 ohm   Lead Channel Impedance Value 456 ohm   Lead Channel Sensing Intrinsic Amplitude 11 mV   Lead Channel Sensing Intrinsic Amplitude 11 mV   Lead Channel Pacing Threshold Amplitude 0.5 V   Lead Channel Pacing Threshold Pulse Width 0.4 ms   Battery Status OK    Battery Remaining Longevity 145 mo  Battery Voltage 3.12 V   Brady Statistic RA Percent Paced 98.18 %   Brady Statistic RV Percent Paced 98.83 %   [place] AP VP Percent 98 %   Brady Statistic AS VP Percent 0.84 %   Brady Statistic AP VS Percent 0.06 %   Brady Statistic AS VS Percent 1.1 %  ECHOCARDIOGRAM COMPLETE     Status: None   Collection Time: 01/12/24 10:51 AM  Result Value Ref Range   Ao pk vel 1.66 m/s   AV Area VTI 1.81 cm2   AR max vel 1.73 cm2   AV Mean grad 6.3 mmHg   AV Peak grad 11.0 mmHg   S' Lateral 3.00 cm   AV Area mean vel 1.58 cm2   Area-P 1/2 2.47 cm2   MV VTI 1.39 cm2   Est EF 55 - 60%   CBC with Differential (Cancer Center Only)     Status: Abnormal   Collection Time: 01/18/24  1:29 PM  Result Value Ref Range   WBC Count 5.1 4.0 - 10.5 K/uL   RBC 3.50 (L) 3.87 - 5.11 MIL/uL   Hemoglobin 9.9 (L) 12.0 - 15.0 g/dL   HCT 67.5 (L) 63.9 - 53.9 %   MCV 92.6 80.0 - 100.0 fL   MCH 28.3 26.0 - 34.0 pg   MCHC 30.6 30.0 - 36.0 g/dL   RDW 87.4 88.4 - 84.4 %   Platelet Count 181 150 - 400 K/uL   nRBC 0.0 0.0 - 0.2 %   Neutrophils Relative % 66 %   Neutro Abs 3.4 1.7 - 7.7 K/uL   Lymphocytes Relative 24 %   Lymphs Abs 1.2 0.7 - 4.0 K/uL   Monocytes Relative 7 %   Monocytes Absolute 0.3 0.1 - 1.0 K/uL   Eosinophils Relative 3 %   Eosinophils Absolute 0.2 0.0 - 0.5 K/uL    Basophils Relative 0 %   Basophils Absolute 0.0 0.0 - 0.1 K/uL   Immature Granulocytes 0 %   Abs Immature Granulocytes 0.01 0.00 - 0.07 K/uL    Comment: Performed at [facility], [address] [place], [place] [address]  Basic Metabolic Panel - Cancer Center Only     Status: Abnormal   Collection Time: 01/18/24  1:29 PM  Result Value Ref Range   Sodium 137 135 - 145 mmol/L   Potassium 4.5 3.5 - 5.1 mmol/L   Chloride 104 98 - 111 mmol/L   CO2 26 22 - 32 mmol/L   Glucose, Bld 100 (H) 70 - 99 mg/dL    Comment: Glucose reference range applies only to samples taken after fasting for at least 8 hours.   BUN 36 (H) 8 - 23 mg/dL   Creatinine 8.70 (H) 9.55 - 1.00 mg/dL   Calcium  9.3 8.9 - 10.3 mg/dL   GFR, Estimated 42 (L) >60 mL/min    Comment: (NOTE) Calculated using the CKD-EPI Creatinine Equation (2021)    Anion gap 7 5 - 15    Comment: Performed at [facility], [address] [place], [place] [address]  Vitamin B12     Status: None   Collection Time: 01/18/24  1:29 PM  Result Value Ref Range   Vitamin B-12 321 180 - 914 pg/mL    Comment: (NOTE) This assay is not validated for testing neonatal or myeloproliferative syndrome specimens for Vitamin B12 levels. Performed at [facility], 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  She really does not have any significant pain or swelling in the right leg.  She had previous dialysis catheters and that common femoral vein and other manipulation so it certainly could have happened with that many years ago.  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  She also has an IVC filter in place.  Hypertension blood pressure control important in reducing the progression of atherosclerotic disease. On appropriate oral  medications.   Type 2 diabetes mellitus with chronic kidney disease, without long-term current use of insulin , unspecified CKD stage (HCC) blood glucose control important in reducing the progression of atherosclerotic disease. Also, involved in wound healing. On appropriate medications.   Lymphedema The patient has chronic, longstanding left lower extremity lymphedema.  She has an IVC filter in place and had previous blood clots in his legs there is clearly a postphlebitic component.  She would benefit from wearing daily compression garments and elevating her leg is much as possible.  Using her lymphedema pump would also be of benefit.  If she develops skin breakdown or weeping of the tissue, I would recommend we get her back into place Unna boots.      Selinda Gu 03/29/2024, 3:10 PM   This note was created with Dragon medical transcription system.  Any errors from dictation are unintentional.

## 2024-03-29 NOTE — Assessment & Plan Note (Signed)
 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  She really does not have any significant pain or swelling in the right leg.  She had previous dialysis catheters and that common femoral vein and other manipulation so it certainly could have happened with that many years ago.  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  She also has an IVC filter in place.

## 2024-03-31 NOTE — Progress Notes (Signed)
 Remote PPM Transmission

## 2024-04-04 ENCOUNTER — Ambulatory Visit

## 2024-04-04 DIAGNOSIS — I4819 Other persistent atrial fibrillation: Secondary | ICD-10-CM

## 2024-04-04 LAB — CUP PACEART REMOTE DEVICE CHECK
Battery Remaining Longevity: 136 mo
Battery Voltage: 3.06 V
Brady Statistic AP VP Percent: 97.83 %
Brady Statistic AP VS Percent: 0.05 %
Brady Statistic AS VP Percent: 0.83 %
Brady Statistic AS VS Percent: 1.3 %
Brady Statistic RA Percent Paced: 98.03 %
Brady Statistic RV Percent Paced: 98.65 %
Date Time Interrogation Session: 20251013025604
Implantable Lead Connection Status: 753985
Implantable Lead Connection Status: 753985
Implantable Lead Implant Date: 20150720
Implantable Lead Implant Date: 20150720
Implantable Lead Location: 753859
Implantable Lead Location: 753860
Implantable Lead Model: 5076
Implantable Lead Model: 5076
Implantable Pulse Generator Implant Date: 20241009
Lead Channel Impedance Value: 285 Ohm
Lead Channel Impedance Value: 323 Ohm
Lead Channel Impedance Value: 456 Ohm
Lead Channel Impedance Value: 475 Ohm
Lead Channel Pacing Threshold Amplitude: 0.5 V
Lead Channel Pacing Threshold Pulse Width: 0.4 ms
Lead Channel Sensing Intrinsic Amplitude: 0.875 mV
Lead Channel Sensing Intrinsic Amplitude: 0.875 mV
Lead Channel Sensing Intrinsic Amplitude: 6.875 mV
Lead Channel Sensing Intrinsic Amplitude: 6.875 mV
Lead Channel Setting Pacing Amplitude: 2 V
Lead Channel Setting Pacing Amplitude: 2 V
Lead Channel Setting Pacing Pulse Width: 0.4 ms
Lead Channel Setting Sensing Sensitivity: 0.9 mV
Zone Setting Status: 755011
Zone Setting Status: 755011

## 2024-04-05 NOTE — Progress Notes (Signed)
 Remote PPM Transmission

## 2024-04-06 ENCOUNTER — Ambulatory Visit: Payer: Self-pay | Admitting: Cardiology

## 2024-04-08 LAB — CUP PACEART INCLINIC DEVICE CHECK
Date Time Interrogation Session: 20250521162315
Implantable Lead Connection Status: 753985
Implantable Lead Connection Status: 753985
Implantable Lead Implant Date: 20150720
Implantable Lead Implant Date: 20150720
Implantable Lead Location: 753859
Implantable Lead Location: 753860
Implantable Lead Model: 5076
Implantable Lead Model: 5076
Implantable Pulse Generator Implant Date: 20241009

## 2024-04-19 ENCOUNTER — Ambulatory Visit

## 2024-04-19 ENCOUNTER — Other Ambulatory Visit (HOSPITAL_COMMUNITY): Payer: Self-pay | Admitting: Cardiology

## 2024-04-19 ENCOUNTER — Ambulatory Visit: Admitting: Internal Medicine

## 2024-04-19 ENCOUNTER — Encounter

## 2024-04-19 ENCOUNTER — Other Ambulatory Visit

## 2024-04-19 ENCOUNTER — Other Ambulatory Visit: Payer: Self-pay

## 2024-04-19 MED ORDER — POTASSIUM CHLORIDE CRYS ER 20 MEQ PO TBCR: EXTENDED_RELEASE_TABLET | ORAL | 0 refills | Status: DC

## 2024-04-19 NOTE — Patient Outreach (Signed)
 Complex Care Management   Visit Note  04/19/2024  Name:  Debra Saunders MRN: [id number] DOB: [birth year]  Situation: Referral received for Complex Care Management related to Heart Failure and Atrial Fibrillation I obtained verbal consent from Patient.  Visit completed with Patient  on the phone  Background:   Past Medical History:  Diagnosis Date   Abdominal pain 07/06/2021   Acute hypokalemia 07/18/2023   Acute on chronic heart failure with preserved ejection fraction (HFpEF) (HCC) 07/17/2023   Arthritis    Atrial fibrillation, persistent (HCC)    a. s/p TEE-guided DCCV on 08/20/2017; b. 06/2021 Recurrent Afib-->amio/TEE/DCCV.   CAD (coronary artery disease)    a. 01/2012 Cath: nonobs dzs; b. 04/2014 MV: No ischemia; c. 09/2017 Cath: Minimal nonobs RCA dzs, otw nl cors. 09/2023: Tikosyn  loading   Cardiorenal syndrome    a. 09/2017 Creat rose to 2.7 w/ diuresis-->HD x 2.   Chronic back pain    Chronic combined systolic (congestive) and diastolic (congestive) heart failure (HCC)    a. Dx 2005 @ [facility];  b. 02/2014 Echo: EF 55-60%; c. 07/2017: EF 30-35%; d. 09/2017 Echo: EF 40-45%; e. 02/2018 Echo: EF 55-60%; f. 08/2019 Echo: EF 60-65%; g. 08/2020 RHC: RA 3, RV 30/1, PA 33/11 (20), PCWP 6; f. 08/2021 Echo: EF 60-65%, no rwma, GrIDD, nl RV fxn, RVSP 40.52mmHg, mod dil LA, mod TR.   Chronic respiratory failure (HCC)    a. on home O2   Gastritis    History of COVID-19    History of endocarditis 08/22/2021   History of intracranial hemorrhage    a. 6/14 described by neurosurgery as small frontal posttraumatic SAH  - pt was on xarelto  @ the time.   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 Chronic LLE edema.   NICM (nonischemic cardiomyopathy) (HCC)    a. 09/2017 Echo: EF 40-45%; b. 09/2017 Cath: minor, insignificant CAD; c. 02/2018 Echo: EF 55-60%, Gr1 DD; d. 08/2019 Echo: EF 60-65%,  Gr2 DD.   Non-ST elevation (NSTEMI) myocardial infarction (HCC) 07/18/2023   Obesity    PAH (pulmonary artery hypertension) (HCC)    a. 09/2017 Echo: PASP ; b. 09/2017 RHC: RA 23, RV 84/13, PA 84/29, PCWP 20, CO 3.89, CI 1.89. LVEDP 18; c. 08/2020 RHC: RA 3, RV 30/1, PA 33/11 (20), PCWP 6. CO/CI 6.18/3.36. PVR 2.3 WU.   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 06/2021. Veg on PPM lead. Not candidate for extraction-->s/p 6 wks abx.   Sepsis with metabolic encephalopathy (HCC)    Sleep apnea    Streptococcal bacteremia    Stroke [facility]) 2014   Subarachnoid hemorrhage (HCC) 01/19/2013   Symptomatic bradycardia    a. s/p BSX PPM.   Thyroid  nodule    Type II diabetes mellitus (HCC)    Urinary incontinence     Assessment: Patient Reported Symptoms:  Cognitive Cognitive Status: No symptoms reported Cognitive/Intellectual Conditions Management [RPT]: None reported or documented in medical history or problem list   Health Maintenance Behaviors: Annual physical exam  Neurological Neurological Review of Symptoms: Dizziness Neurological Management Strategies: Routine screening Neurological Comment: reports dizzy daily - not related to any activity - still with hot flashes, declined acute visit as discussed prior - wants to wit for scheduled appointement  HEENT HEENT Symptoms Reported:  No symptoms reported HEENT Management Strategies: Routine screening HEENT Comment: has dental resources, declined to make appointment for next year, no issues right now    Cardiovascular Cardiovascular Symptoms Reported: Swelling in legs or feet Does patient have uncontrolled Hypertension?: No Cardiovascular Management Strategies: Medication therapy Do You Have a Working Readable Scale?: Yes Weight: 192 lb (87.1 kg) Cardiovascular Comment: states weight got up to 199.9 last week, was very SOB and both legs were swollen - did not call HF Clinic since  happened before followed rescue plan, discussed ED red flags if rescue plan not working, O24L  Respiratory Respiratory Symptoms Reported: Shortness of breath Other Respiratory Symptoms: O2 4L [place] PO2 92% yesterday, currently 98% HR 55, reports SOB was bad last week with weight gain/fluid, better this week, SOB/DOE is back to normal for her Respiratory Management Strategies: CPAP, Oxygen  therapy, Routine screening  Endocrine Is patient diabetic?: No Is patient checking blood sugars at home?: No    Gastrointestinal Gastrointestinal Symptoms Reported: Diarrhea Additional Gastrointestinal Details: diarrhea continues  - GI 06/06/2024 Gastrointestinal Management Strategies: Incontinence garment/pad    Genitourinary Genitourinary Symptoms Reported: No symptoms reported    Integumentary Integumentary Symptoms Reported: No symptoms reported    Musculoskeletal Musculoskelatal Symptoms Reviewed: Limited mobility, Weakness Additional Musculoskeletal Details: rollator, weakness due to breathing Musculoskeletal Management Strategies: Medication therapy, Routine screening Falls in the past year?: No Number of falls in past year: 1 or less Patient at Risk for Falls Due to: Impaired balance/gait, Impaired mobility Fall risk Follow up: Falls evaluation completed, Falls prevention discussed (ED for head bumps)  Psychosocial Psychosocial Symptoms Reported: No symptoms reported Other Psychosocial Conditions: sleep still an issue - difficulty getting to sleep - night and day mixed up          04/19/2024    PHQ2-9 Depression Screening   Little interest or pleasure in doing things Not at all  Feeling down, depressed, or hopeless Not at all  PHQ-2 - Total Score 0  Trouble falling or staying asleep, or sleeping too much    Feeling tired or having little energy    Poor appetite or overeating     Feeling bad about yourself - or that you are a failure or have let yourself or your family down    Trouble  concentrating on things, such as reading the newspaper or watching television    Moving or speaking so slowly that other people could have noticed.  Or the opposite - being so fidgety or restless that you have been moving around a lot more than usual    Thoughts that you would be better off dead, or hurting yourself in some way    PHQ2-9 Total Score    If you checked off any problems, how difficult have these problems made it for you to do your work, take care of things at home, or get along with other people    Depression Interventions/Treatment      Vitals:   04/19/24 1312  Pulse: (!) 55  SpO2: 98%    Medications Reviewed Today     Reviewed by Devra Lands, RN (Registered Nurse) on 04/19/24 at 1308  Med List Status: <None>   Medication Order Taking? Sig Documenting Provider Last Dose Status Informant  acetaminophen  (TYLENOL ) 325 MG tablet [id number] Yes Take 325-650 mg by mouth every 6 (six) hours as needed for mild pain. [provider]  Active Self           Med Note (HAGOPIAN, TYELISHA L  Tue Sep 29, 2023  2:49 PM)    albuterol  (ACCUNEB ) 0.63 MG/3ML nebulizer solution [id number] Yes Take 1 ampule by nebulization every 6 (six) hours as needed for wheezing. [provider]  Active Self  apixaban  (ELIQUIS ) 5 MG TABS tablet [id number] Yes Take 1 tablet (5 mg total) by mouth 2 (two) times daily. RESUME SUNDAY pm Fernande Elspeth BROCKS, MD  Active Self  atropine  1 % ophthalmic solution [id number] Yes Place 1 drop into both eyes at bedtime. [provider]  Active Self  cefadroxil  (DURICEF) 500 MG capsule [id number] Yes Take 1 capsule by mouth twice daily Ravishankar, Donald, MD  Active   dapagliflozin  propanediol (FARXIGA ) 10 MG TABS tablet [id number] Yes Take 1 tablet (10 mg total) by mouth daily before breakfast. Riddle, Suzann, NP  Active   diclofenac  Sodium (VOLTAREN ) 1 % topical gel 2 g [id number]   Bair, Kalpana, MD  Active   dofetilide  (TIKOSYN ) 250 MCG capsule  [id number] Yes Take 1 capsule (250 mcg total) by mouth 2 (two) times daily. Riddle, Suzann, NP  Active   gabapentin  (NEURONTIN ) 600 MG tablet [id number] Yes Take 1 tablet (600 mg total) by mouth at bedtime. Bair, Luke, MD  Active   macitentan  (OPSUMIT ) 10 MG tablet [id number] Yes Take 1 tablet (10 mg total) by mouth daily. Rolan Ezra RAMAN, MD  Active Self  meclizine  (ANTIVERT ) 25 MG tablet [id number] Yes Take 1 tablet (25 mg total) by mouth 3 (three) times daily as needed for dizziness. Rolan Ezra RAMAN, MD  Active Self  Menthol , Topical Analgesic, (BIOFREEZE) 10 % CREA [id number] Yes Apply 1 Application topically daily as needed (apply to back for pain). [provider]  Active   midodrine  (PROAMATINE ) 5 MG tablet [id number] Yes Take 1 tablet (5 mg total) by mouth 3 (three) times daily with meals. Zenaida Morene PARAS, MD  Active   OXYGEN  [id number] Yes Inhale 4 L/min into the lungs continuous. [provider]  Active   potassium chloride  SA (KLOR-CON  M) 20 MEQ tablet [id number]  TAKE 2 TABLETS BY MOUTH IN THE MORNING AND 1 IN THE EVENING. PLEASE SCHEDULE APPOINTMENT FOR MORE REFILLS  Patient not taking: Reported on 04/19/2024   Rolan Ezra RAMAN, MD  Active   prednisoLONE  acetate (PRED FORTE ) 1 % ophthalmic suspension [id number] Yes Place 1 drop into both eyes 2 (two) times daily. [provider]  Active   rosuvastatin  (CRESTOR ) 10 MG tablet [id number] Yes Take 1 tablet (10 mg total) by mouth daily. Rolan Ezra RAMAN, MD  Active   sildenafil  (REVATIO ) 20 MG tablet [id number] Yes Take 1 tablet (20 mg total) by mouth 3 (three) times daily. Rolan Ezra RAMAN, MD  Active   spironolactone  (ALDACTONE ) 25 MG tablet [id number] Yes Take 1 tablet by mouth daily. [provider]  Active Self  torsemide  (DEMADEX ) 20 MG tablet [id number] Yes Take 3 tablets (60 mg total) by mouth daily. Rolan Ezra RAMAN, MD  Active   UPTRAVI  1600 MCG TABS [id number] Yes Take 1 tablet twice daily. Rolan Ezra RAMAN, MD  Active   Vitamin D , Ergocalciferol , (DRISDOL ) 1.25 MG (50000 UNIT) CAPS capsule [id number] Yes Take 1 capsule (50,000 Units total) by mouth every 7 (seven) days. Abbey Luke, MD  Active             Recommendation:   PCP Follow-up Continue Current Plan of Care  Follow Up Plan:   Telephone follow-up in 1 month  [name], MSN, RN [facility]  [facility], Population  Health RN Care Manager Direct Dial: [phone] Fax: [phone]

## 2024-04-19 NOTE — Patient Instructions (Signed)
 Visit Information  Thank you for taking time to visit with me today. Please don't hesitate to contact me if I can be of assistance to you before our next scheduled appointment.  Your next care management appointment is by telephone on 05/17/2024 at 1:30 pm  Telephone follow-up in 1 month  Please call the care guide team at [phone] if you need to cancel, schedule, or reschedule an appointment.   Please call the Suicide and Crisis Lifeline: 988 call the [place]  National Suicide Prevention Lifeline: (760)609-3945 or TTY: (469)017-3612 TTY 803-534-1327) to talk to a trained counselor call 1-800-273-TALK (toll free, 24 hour hotline) go to [facility] [address], [place] [phone]) call 911 if you are experiencing a Mental Health or Behavioral Health Crisis or need someone to talk to.  [name], MSN, RN [facility], [facility] RN Care Manager Direct Dial: [phone] Fax: [phone]

## 2024-04-19 NOTE — Patient Outreach (Signed)
 RNCM spoke with Chantelle at Cardiology [facility] [place] office regarding patient out of potassium for 1 month. Office agreed to refill, will call daughter to schedule follow up appointment as discussed. Patient and Dr Abbey updated.

## 2024-04-22 ENCOUNTER — Other Ambulatory Visit: Payer: Self-pay | Admitting: Cardiology

## 2024-04-29 ENCOUNTER — Ambulatory Visit: Payer: Self-pay | Admitting: Internal Medicine

## 2024-04-29 ENCOUNTER — Other Ambulatory Visit: Payer: Self-pay

## 2024-04-29 ENCOUNTER — Inpatient Hospital Stay (HOSPITAL_BASED_OUTPATIENT_CLINIC_OR_DEPARTMENT_OTHER): Admitting: Internal Medicine

## 2024-04-29 ENCOUNTER — Inpatient Hospital Stay

## 2024-04-29 ENCOUNTER — Other Ambulatory Visit: Payer: Self-pay | Admitting: *Deleted

## 2024-04-29 ENCOUNTER — Encounter: Payer: Self-pay | Admitting: Internal Medicine

## 2024-04-29 ENCOUNTER — Inpatient Hospital Stay: Attending: Internal Medicine

## 2024-04-29 VITALS — BP 117/64 | HR 56 | Temp 96.0°F | Resp 18 | Ht 60.0 in | Wt 192.7 lb

## 2024-04-29 DIAGNOSIS — E611 Iron deficiency: Secondary | ICD-10-CM

## 2024-04-29 DIAGNOSIS — E538 Deficiency of other specified B group vitamins: Secondary | ICD-10-CM | POA: Diagnosis not present

## 2024-04-29 DIAGNOSIS — Z87891 Personal history of nicotine dependence: Secondary | ICD-10-CM | POA: Insufficient documentation

## 2024-04-29 DIAGNOSIS — Z86718 Personal history of other venous thrombosis and embolism: Secondary | ICD-10-CM | POA: Diagnosis not present

## 2024-04-29 DIAGNOSIS — I4891 Unspecified atrial fibrillation: Secondary | ICD-10-CM | POA: Insufficient documentation

## 2024-04-29 DIAGNOSIS — I130 Hypertensive heart and chronic kidney disease with heart failure and stage 1 through stage 4 chronic kidney disease, or unspecified chronic kidney disease: Secondary | ICD-10-CM | POA: Insufficient documentation

## 2024-04-29 DIAGNOSIS — Z803 Family history of malignant neoplasm of breast: Secondary | ICD-10-CM | POA: Insufficient documentation

## 2024-04-29 DIAGNOSIS — D649 Anemia, unspecified: Secondary | ICD-10-CM | POA: Diagnosis not present

## 2024-04-29 DIAGNOSIS — N183 Chronic kidney disease, stage 3 unspecified: Secondary | ICD-10-CM | POA: Diagnosis not present

## 2024-04-29 DIAGNOSIS — R222 Localized swelling, mass and lump, trunk: Secondary | ICD-10-CM | POA: Insufficient documentation

## 2024-04-29 DIAGNOSIS — D631 Anemia in chronic kidney disease: Secondary | ICD-10-CM | POA: Insufficient documentation

## 2024-04-29 DIAGNOSIS — I252 Old myocardial infarction: Secondary | ICD-10-CM | POA: Insufficient documentation

## 2024-04-29 DIAGNOSIS — Z7901 Long term (current) use of anticoagulants: Secondary | ICD-10-CM | POA: Insufficient documentation

## 2024-04-29 DIAGNOSIS — Z950 Presence of cardiac pacemaker: Secondary | ICD-10-CM | POA: Insufficient documentation

## 2024-04-29 DIAGNOSIS — R5383 Other fatigue: Secondary | ICD-10-CM | POA: Insufficient documentation

## 2024-04-29 LAB — CBC WITH DIFFERENTIAL (CANCER CENTER ONLY)
Abs Immature Granulocytes: 0.01 K/uL (ref 0.00–0.07)
Basophils Absolute: 0 K/uL (ref 0.0–0.1)
Basophils Relative: 1 %
Eosinophils Absolute: 0.2 K/uL (ref 0.0–0.5)
Eosinophils Relative: 4 %
HCT: 32.7 % — ABNORMAL LOW (ref 36.0–46.0)
Hemoglobin: 9.8 g/dL — ABNORMAL LOW (ref 12.0–15.0)
Immature Granulocytes: 0 %
Lymphocytes Relative: 25 %
Lymphs Abs: 1.1 K/uL (ref 0.7–4.0)
MCH: 27.6 pg (ref 26.0–34.0)
MCHC: 30 g/dL (ref 30.0–36.0)
MCV: 92.1 fL (ref 80.0–100.0)
Monocytes Absolute: 0.3 K/uL (ref 0.1–1.0)
Monocytes Relative: 7 %
Neutro Abs: 2.7 K/uL (ref 1.7–7.7)
Neutrophils Relative %: 63 %
Platelet Count: 174 K/uL (ref 150–400)
RBC: 3.55 MIL/uL — ABNORMAL LOW (ref 3.87–5.11)
RDW: 12.3 % (ref 11.5–15.5)
WBC Count: 4.2 K/uL (ref 4.0–10.5)
nRBC: 0 % (ref 0.0–0.2)

## 2024-04-29 LAB — BASIC METABOLIC PANEL - CANCER CENTER ONLY
Anion gap: 11 (ref 5–15)
BUN: 23 mg/dL (ref 8–23)
CO2: 26 mmol/L (ref 22–32)
Calcium: 8.5 mg/dL — ABNORMAL LOW (ref 8.9–10.3)
Chloride: 101 mmol/L (ref 98–111)
Creatinine: 1 mg/dL (ref 0.44–1.00)
GFR, Estimated: 57 mL/min — ABNORMAL LOW (ref >60)
Glucose, Bld: 105 mg/dL — ABNORMAL HIGH (ref 70–99)
Potassium: 2.8 mmol/L — ABNORMAL LOW (ref 3.5–5.1)
Sodium: 138 mmol/L (ref 135–145)

## 2024-04-29 LAB — VITAMIN B12: Vitamin B-12: 261 pg/mL (ref 180–914)

## 2024-04-29 LAB — IRON AND TIBC
Iron: 65 ug/dL (ref 28–170)
Saturation Ratios: 17 % (ref 10.4–31.8)
TIBC: 385 ug/dL (ref 250–450)
UIBC: 320 ug/dL

## 2024-04-29 LAB — FERRITIN: Ferritin: 582 ng/mL — ABNORMAL HIGH (ref 11–307)

## 2024-04-29 NOTE — Progress Notes (Signed)
 [facility] CONSULT NOTE  Patient Care Team: Bair, Kalpana, MD as PCP - General (Family Medicine) Darron Deatrice LABOR, MD as PCP - Cardiology (Cardiology) Cindie Ole DASEN, MD as PCP - Electrophysiology (Clinical Cardiac Electrophysiology) Lenord Sensing, MD as Referring Physician (Allergy) Alaine Vicenta NOVAK, MD as Referring Physician (Internal Medicine) Rennie Cindy SAUNDERS, MD as Consulting Physician (Oncology) Arno Rosaline SQUIBB, RN as Registered Nurse Devra Lands, RN as [facility] Riddle, Suzann, NP as Nurse Practitioner (Clinical Cardiac Electrophysiology) Anemia CHIEF COMPLAINTS/PURPOSE OF CONSULTATION: Anemia   HEMATOLOGY HISTORY  # CHORNIC IRON  DEFICIENCY ANEMIA [BL 10-11]-Jan today 21 PCP Hb-8.8; [Iron  sat-5%; ferritin 11] EGD-; colonoscopy MARCH 2020 -polyp [[facility]-hold further work-up secondary to cardiac issues]; remote-EGD-?;  February 2021-LDH/haptoglobin normal; kappa lambda light chain ratio/M panel normal;  on Venofer ; November 2020-CT scan question early cirrhosis/no splenomegaly.  No acute process; MARCH 2021- STOOL OCCULT x3- NEGATIVE.   #B12 deficiency-IM every 3 to 4 months  #2014 -question blood transfusion  [patient admitted for stroke]; CHF chronic -[ 2004 on 4 lits; Dr.Arida/Klein]; CT scan chest 2021 -no adenopathy; fibrotic changes; walker; CKD stage III-   #Incidental right axillary mass-s/p US  biopsy [PCP] -negative for malignancy.  HISTORY OF PRESENTING ILLNESS: Patient in wheelchair on  O2.  Alone.  Discussed the use of AI scribe software for clinical note transcription with the patient, who gave verbal consent to proceed.  History of Present Illness Debra Saunders is an 78 year old female multiple comorbidities including A-fib on anticoagulation, COPD/chronic congestive failure on oxygen  and anemia/likely secondary to ?CKD stage III -as needed IV iron  is here for follow-up.  She has been taking potassium  supplements daily but missed her dose last week, resuming on Sunday. Her current potassium level is 2.8, which is below the normal range of 3.5. No symptoms of hypokalemia such as muscle weakness or cramps have been reported.  Her hemoglobin level is 9.8, which is slightly low.   Review of Systems  Constitutional:  Positive for malaise/fatigue. Negative for chills, diaphoresis, fever and weight loss.  HENT:  Negative for nosebleeds and sore throat.   Eyes:  Negative for double vision.  Respiratory:  Positive for shortness of breath. Negative for cough, hemoptysis, sputum production and wheezing.   Cardiovascular:  Negative for chest pain, palpitations, orthopnea and leg swelling.  Gastrointestinal:  Negative for abdominal pain, blood in stool, constipation, heartburn, melena, nausea and vomiting.  Genitourinary:  Negative for dysuria, frequency and urgency.  Musculoskeletal:  Negative for back pain and joint pain.  Skin: Negative.  Negative for itching and rash.  Neurological:  Negative for dizziness, tingling, focal weakness, weakness and headaches.  Endo/Heme/Allergies:  Does not bruise/bleed easily.  Psychiatric/Behavioral:  Negative for depression. The patient is not nervous/anxious and does not have insomnia.     MEDICAL HISTORY:  Past Medical History:  Diagnosis Date   Abdominal pain 07/06/2021   Acute hypokalemia 07/18/2023   Acute on chronic heart failure with preserved ejection fraction (HFpEF) (HCC) 07/17/2023   Arthritis    Atrial fibrillation, persistent (HCC)    a. s/p TEE-guided DCCV on 08/20/2017; b. 06/2021 Recurrent Afib-->amio/TEE/DCCV.   CAD (coronary artery disease)    a. 01/2012 Cath: nonobs dzs; b. 04/2014 MV: No ischemia; c. 09/2017 Cath: Minimal nonobs RCA dzs, otw nl cors. 09/2023: Tikosyn  loading   Cardiorenal syndrome    a. 09/2017 Creat rose to 2.7 w/ diuresis-->HD x 2.   Chronic back pain    Chronic combined  systolic (congestive) and diastolic (congestive)  heart failure (HCC)    a. Dx 2005 @ [facility];  b. 02/2014 Echo: EF 55-60%; c. 07/2017: EF 30-35%; d. 09/2017 Echo: EF 40-45%; e. 02/2018 Echo: EF 55-60%; f. 08/2019 Echo: EF 60-65%; g. 08/2020 RHC: RA 3, RV 30/1, PA 33/11 (20), PCWP 6; f. 08/2021 Echo: EF 60-65%, no rwma, GrIDD, nl RV fxn, RVSP 40.66mmHg, mod dil LA, mod TR.   Chronic respiratory failure (HCC)    a. on home O2   Gastritis    History of COVID-19    History of endocarditis 08/22/2021   History of intracranial hemorrhage    a. 6/14 described by neurosurgery as small frontal posttraumatic SAH  - pt was on xarelto  @ the time.   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 Chronic LLE edema.   NICM (nonischemic cardiomyopathy) (HCC)    a. 09/2017 Echo: EF 40-45%; b. 09/2017 Cath: minor, insignificant CAD; c. 02/2018 Echo: EF 55-60%, Gr1 DD; d. 08/2019 Echo: EF 60-65%, Gr2 DD.   Non-ST elevation (NSTEMI) myocardial infarction (HCC) 07/18/2023   Obesity    PAH (pulmonary artery hypertension) (HCC)    a. 09/2017 Echo: PASP ; b. 09/2017 RHC: RA 23, RV 84/13, PA 84/29, PCWP 20, CO 3.89, CI 1.89. LVEDP 18; c. 08/2020 RHC: RA 3, RV 30/1, PA 33/11 (20), PCWP 6. CO/CI 6.18/3.36. PVR 2.3 WU.   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 06/2021. Veg on PPM lead. Not candidate for extraction-->s/p 6 wks abx.   Sepsis with metabolic encephalopathy (HCC)    Sleep apnea    Streptococcal bacteremia    Stroke [facility]) 2014   Subarachnoid hemorrhage (HCC) 01/19/2013   Symptomatic bradycardia    a. s/p BSX PPM.   Thyroid  nodule    Type II diabetes mellitus (HCC)    Urinary incontinence     SURGICAL HISTORY: Past Surgical History:  Procedure Laterality Date   ABDOMINAL HYSTERECTOMY  1969   CARDIAC CATHETERIZATION  2013   @ [facility]: No obstructive CAD: Only 20% ostial left circumflex.    CARDIOVERSION N/A 10/12/2017   Procedure: CARDIOVERSION;  Surgeon: Perla Evalene PARAS, MD;  Location: [facility] ORS;  Service: Cardiovascular;  Laterality: N/A;   CARDIOVERSION N/A 07/04/2021   Procedure: CARDIOVERSION;  Surgeon: Rolan Ezra RAMAN, MD;  Location: [facility] ENDOSCOPY;  Service: Cardiovascular;  Laterality: N/A;   CARDIOVERSION N/A 03/27/2023   Procedure: CARDIOVERSION;  Surgeon: Rolan Ezra RAMAN, MD;  Location: [facility] ORS;  Service: Cardiovascular;  Laterality: N/A;   CARDIOVERSION N/A 10/01/2023   Procedure: CARDIOVERSION;  Surgeon: Rolan Ezra RAMAN, MD;  Location: [facility] INVASIVE CV LAB;  Service: Cardiovascular;  Laterality: N/A;   CATARACT EXTRACTION W/PHACO Right 09/19/2020   Procedure: CATARACT EXTRACTION PHACO AND INTRAOCULAR LENS PLACEMENT (IOC) RIGHT DIABETIC 3.95 00:43.0 9.2%;  Surgeon: Mittie Gaskin, MD;  Location[facility];  Service: Ophthalmology;  Laterality: Right;  Pt requests to be last sleep apnea   CATARACT EXTRACTION W/PHACO Left 10/03/2020   Procedure: CATARACT EXTRACTION PHACO AND INTRAOCULAR LENS PLACEMENT (IOC) LEFT DIABETIC;  Surgeon: Mittie Gaskin, MD;  Location: [facility] SURGERY CNTR;  Service: Ophthalmology;  Laterality: Left;  3.25 0:59.3 5.5%   CHOLECYSTECTOMY     COLONOSCOPY WITH PROPOFOL  N/A 09/10/2018   Procedure: COLONOSCOPY WITH PROPOFOL ;  Surgeon: Viktoria,  Lamar DASEN, MD;  Location: [facility] ENDOSCOPY;  Service: Endoscopy;  Laterality: N/A;   COLONOSCOPY WITH PROPOFOL  N/A 10/22/2022   Procedure: COLONOSCOPY WITH PROPOFOL ;  Surgeon: Onita Elspeth Sharper, DO;  Location: [facility];  Service: Gastroenterology;  Laterality: N/A;   CYST REMOVAL TRUNK  2002   BACK   DIALYSIS/PERMA CATHETER INSERTION N/A 10/06/2017   Procedure: DIALYSIS/PERMA CATHETER INSERTION;  Surgeon: Jama Cordella MATSU, MD;  Location: [facility] INVASIVE CV LAB;  Service: Cardiovascular;  Laterality: N/A;   ESOPHAGOGASTRODUODENOSCOPY (EGD) WITH PROPOFOL  N/A 10/21/2022    Procedure: ESOPHAGOGASTRODUODENOSCOPY (EGD) WITH PROPOFOL ;  Surgeon: Onita Elspeth Sharper, DO;  Location: [facility] ENDOSCOPY;  Service: Gastroenterology;  Laterality: N/A;   GIVENS CAPSULE STUDY N/A 10/22/2022   Procedure: GIVENS CAPSULE STUDY;  Surgeon: Onita Elspeth Sharper, DO;  Location: [facility] ENDOSCOPY;  Service: Gastroenterology;  Laterality: N/A;  IF NOTHING IS FOUND WITH COLONOSCOPY   INSERT / REPLACE / REMOVE PACEMAKER     lipoma removal  2014   back   PACEMAKER INSERTION  2015   [employer] dual chamber pacemaker implanted by Dr Fernande for symptomatic bradycardia   PERMANENT PACEMAKER INSERTION N/A 01/09/2014   Procedure: PERMANENT PACEMAKER INSERTION;  Surgeon: Elspeth JAYSON Fernande, MD;  Location: [facility] CATH LAB;  Service: Cardiovascular;  Laterality: N/A;   PPM GENERATOR CHANGEOUT N/A 04/01/2023   Procedure: PPM GENERATOR CHANGEOUT;  Surgeon: Fernande Elspeth JAYSON, MD;  Location: [facility] CV [facility];  Service: Cardiovascular;  Laterality: N/A;   RIGHT HEART CATH N/A 04/19/2018   Procedure: RIGHT HEART CATH;  Surgeon: Rolan Ezra RAMAN, MD;  Location: [facility] INVASIVE CV LAB;  Service: Cardiovascular;  Laterality: N/A;   RIGHT HEART CATH N/A 08/30/2020   Procedure: RIGHT HEART CATH;  Surgeon: Rolan Ezra RAMAN, MD;  Location: [facility] INVASIVE CV LAB;  Service: Cardiovascular;  Laterality: N/A;   RIGHT HEART CATH N/A 02/11/2022   Procedure: RIGHT HEART CATH;  Surgeon: Rolan Ezra RAMAN, MD;  Location: [facility] INVASIVE CV LAB;  Service: Cardiovascular;  Laterality: N/A;   RIGHT/LEFT HEART CATH AND CORONARY ANGIOGRAPHY N/A 10/19/2017   Procedure: RIGHT/LEFT HEART CATH AND CORONARY ANGIOGRAPHY;  Surgeon: Darron Deatrice LABOR, MD;  Location: [facility] INVASIVE CV LAB;  Service: Cardiovascular;  Laterality: N/A;   RIGHT/LEFT HEART CATH AND CORONARY ANGIOGRAPHY N/A 07/20/2023   Procedure: RIGHT/LEFT HEART CATH AND CORONARY ANGIOGRAPHY;  Surgeon: Darron Deatrice LABOR, MD;  Location: [facility] INVASIVE CV LAB;  Service: Cardiovascular;   Laterality: N/A;   TEE WITHOUT CARDIOVERSION N/A 08/20/2017   Procedure: TRANSESOPHAGEAL ECHOCARDIOGRAM (TEE) & Direct current cardioversion;  Surgeon: Perla Evalene PARAS, MD;  Location: [facility] ORS;  Service: Cardiovascular;  Laterality: N/A;   TEE WITHOUT CARDIOVERSION N/A 06/28/2021   Procedure: TRANSESOPHAGEAL ECHOCARDIOGRAM (TEE);  Surgeon: Perla Evalene PARAS, MD;  Location: [facility] ORS;  Service: Cardiovascular;  Laterality: N/A;   TEE WITHOUT CARDIOVERSION N/A 07/04/2021   Procedure: TRANSESOPHAGEAL ECHOCARDIOGRAM (TEE);  Surgeon: Rolan Ezra RAMAN, MD;  Location: [facility] ENDOSCOPY;  Service: Cardiovascular;  Laterality: N/A;   TRIGGER FINGER RELEASE      SOCIAL HISTORY: Social History   Socioeconomic History   Marital status: Divorced    Spouse name: Not on file   Number of children: 4   Years of education: 12   Highest education level: Associate degree: occupational, scientist, product/process development, or vocational program  Occupational History   Occupation: Retired- factory work    Comment: exposed to interior and spatial designer  Tobacco Use   Smoking status: Former    Current packs/day: 0.00    Average  packs/day: 0.3 packs/day for 2.0 years (0.6 ttl pk-yrs)    Types: Cigarettes    Start date: 06/23/1988    Quit date: 06/23/1990    Years since quitting: 33.8    Passive exposure: Past   Smokeless tobacco: Never   Tobacco comments:    Former smoker 10/08/23  Vaping Use   Vaping status: Never Used  Substance and Sexual Activity   Alcohol use: No   Drug use: No   Sexual activity: Not Currently    Birth control/protection: Surgical  Other Topics Concern   Not on file  Social History Narrative   Lives in [place] alone. Has 4 children.  Daughter lives 8 minutes away. No pets.      Work - hotel manager, retired      Diet - regular      Remote hx of smoking > 30 years; No alcohol-    Social Drivers of Corporate Investment Banker Strain: Low Risk  (03/15/2024)   Overall Financial Resource Strain (CARDIA)     Difficulty of Paying Living Expenses: Not hard at all  Food Insecurity: No Food Insecurity (03/15/2024)   Hunger Vital Sign    Worried About Running Out of Food in the Last Year: Never true    Ran Out of Food in the Last Year: Never true  Transportation Needs: No Transportation Needs (04/19/2024)   PRAPARE - Administrator, Civil Service (Medical): No    Lack of Transportation (Non-Medical): No  Physical Activity: Inactive (03/15/2024)   Exercise Vital Sign    Days of Exercise per Week: 0 days    Minutes of Exercise per Session: 0 min  Stress: No Stress Concern Present (03/15/2024)   [employer] of Occupational Health - Occupational Stress Questionnaire    Feeling of Stress: Not at all  Social Connections: Socially Isolated (03/15/2024)   Social Connection and Isolation Panel    Frequency of Communication with Friends and Family: More than three times a week    Frequency of Social Gatherings with Friends and Family: More than three times a week    Attends Religious Services: Never    Database Administrator or Organizations: No    Attends Banker Meetings: Never    Marital Status: Widowed  Intimate Partner Violence: Not At Risk (03/15/2024)   Humiliation, Afraid, Rape, and Kick questionnaire    Fear of Current or Ex-Partner: No    Emotionally Abused: No    Physically Abused: No    Sexually Abused: No    FAMILY HISTORY: Family History  Problem Relation Age of Onset   Heart disease Mother    Hypertension Mother    COPD Mother        was a smoker   Thyroid  disease Mother    Hypertension Father    Hypertension Sister    Thyroid  disease Sister    Breast cancer Sister    Hypertension Sister    Thyroid  disease Sister    Bone cancer Sister    Stroke Son    Breast cancer Maternal Aunt    Kidney disease Neg Hx    Bladder Cancer Neg Hx     ALLERGIES:  is allergic to aspirin  and other.  MEDICATIONS:  Current Outpatient Medications  Medication Sig  Dispense Refill   acetaminophen  (TYLENOL ) 325 MG tablet Take 325-650 mg by mouth every 6 (six) hours as needed for mild pain.     albuterol  (ACCUNEB ) 0.63 MG/3ML nebulizer solution Take 1 ampule  by nebulization every 6 (six) hours as needed for wheezing.     apixaban  (ELIQUIS ) 5 MG TABS tablet Take 1 tablet (5 mg total) by mouth 2 (two) times daily. RESUME SUNDAY pm 180 tablet 3   atropine  1 % ophthalmic solution Place 1 drop into both eyes at bedtime.     cefadroxil  (DURICEF) 500 MG capsule Take 1 capsule by mouth twice daily 60 capsule 6   dapagliflozin  propanediol (FARXIGA ) 10 MG TABS tablet TAKE 1 TABLET BY MOUTH ONCE DAILY BEFORE BREAKFAST 90 tablet 2   dofetilide  (TIKOSYN ) 250 MCG capsule Take 1 capsule (250 mcg total) by mouth 2 (two) times daily. 180 capsule 2   gabapentin  (NEURONTIN ) 600 MG tablet Take 1 tablet (600 mg total) by mouth at bedtime. 90 tablet 1   macitentan  (OPSUMIT ) 10 MG tablet Take 1 tablet (10 mg total) by mouth daily. 30 tablet 11   meclizine  (ANTIVERT ) 25 MG tablet Take 1 tablet (25 mg total) by mouth 3 (three) times daily as needed for dizziness. 30 tablet 1   Menthol , Topical Analgesic, (BIOFREEZE) 10 % CREA Apply 1 Application topically daily as needed (apply to back for pain).     midodrine  (PROAMATINE ) 5 MG tablet Take 1 tablet (5 mg total) by mouth 3 (three) times daily with meals. 90 tablet 5   OXYGEN  Inhale 4 L/min into the lungs continuous.     potassium chloride  SA (KLOR-CON  M) 20 MEQ tablet TAKE 2 TABLETS BY MOUTH IN THE MORNING AND 1 IN THE EVENING. PLEASE SCHEDULE APPOINTMENT FOR MORE REFILLS 90 tablet 0   prednisoLONE  acetate (PRED FORTE ) 1 % ophthalmic suspension Place 1 drop into both eyes 2 (two) times daily.     rosuvastatin  (CRESTOR ) 10 MG tablet Take 1 tablet (10 mg total) by mouth daily. 90 tablet 3   sildenafil  (REVATIO ) 20 MG tablet Take 1 tablet (20 mg total) by mouth 3 (three) times daily. 270 tablet 3   spironolactone  (ALDACTONE ) 25 MG tablet  Take 1 tablet by mouth daily.     torsemide  (DEMADEX ) 20 MG tablet Take 3 tablets (60 mg total) by mouth daily. 200 tablet 3   UPTRAVI  1600 MCG TABS Take 1 tablet twice daily. 60 tablet 11   Vitamin D , Ergocalciferol , (DRISDOL ) 1.25 MG (50000 UNIT) CAPS capsule Take 1 capsule (50,000 Units total) by mouth every 7 (seven) days. 12 capsule 3   Current Facility-Administered Medications  Medication Dose Route Frequency Provider Last Rate Last Admin   diclofenac  Sodium (VOLTAREN ) 1 % topical gel 2 g  2 g Topical QID           PHYSICAL EXAMINATION:   Vitals:   04/29/24 1330  BP: 117/64  Pulse: (!) 56  Resp: 18  Temp: (!) 96 F (35.6 C)  SpO2: 100%     Filed Weights   04/29/24 1330  Weight: 192 lb 11.2 oz (87.4 kg)      Physical Exam Constitutional:      Comments: Patient in wheelchair.  Alone.  She is on O2 nasal cannula.  HENT:     Head: Normocephalic and atraumatic.     Mouth/Throat:     Pharynx: No oropharyngeal exudate.  Eyes:     Pupils: Pupils are equal, round, and reactive to light.  Cardiovascular:     Rate and Rhythm: Normal rate and regular rhythm.  Pulmonary:     Effort: No respiratory distress.     Breath sounds: No wheezing.     Comments: Decreased  air entry bilaterally.  No wheeze or crackles. Abdominal:     General: Bowel sounds are normal. There is no distension.     Palpations: Abdomen is soft. There is no mass.     Tenderness: There is no abdominal tenderness. There is no guarding or rebound.  Musculoskeletal:        General: No tenderness. Normal range of motion.     Cervical back: Normal range of motion and neck supple.  Skin:    General: Skin is warm.  Neurological:     Mental Status: She is alert and oriented to person, place, and time.  Psychiatric:        Mood and Affect: Affect normal.     LABORATORY DATA:  I have reviewed the data as listed Lab Results  Component Value Date   WBC 4.2 04/29/2024   HGB 9.8 (L) 04/29/2024   HCT  32.7 (L) 04/29/2024   MCV 92.1 04/29/2024   PLT 174 04/29/2024   Recent Labs    09/29/23 1433 09/30/23 0425 10/19/23 1255 11/11/23 1407 01/18/24 1329 04/29/24 1333  NA  --    < > 139 141 137 138  K  --    < > 3.9 4.7 4.5 2.8*  CL  --    < > 103 105 104 101  CO2  --    < > 26 21 26 26   GLUCOSE  --    < > 107* 83 100* 105*  BUN  --    < > 25* 25 36* 23  CREATININE  --    < > 1.22* 1.07* 1.29* 1.00  CALCIUM   --    < > 8.9 9.4 9.3 8.5*  GFRNONAA  --    < > 45*  --  42* 57*  PROT 6.8  --   --   --   --   --   ALBUMIN  3.8  --   --   --   --   --   AST 13*  --   --   --   --   --   ALT 9  --   --   --   --   --   ALKPHOS 83  --   --   --   --   --   BILITOT 0.6  --   --   --   --   --   BILIDIR 0.1  --   --   --   --   --   IBILI 0.5  --   --   --   --   --    < > = values in this interval not displayed.     CUP PACEART REMOTE DEVICE CHECK Result Date: 04/04/2024 Pacemaker: Scheduled remote reviewed. Normal device function.  Presenting rhythm: AS/VS-far field R wave sensing 1 AF episode, 46 sec, programmed DDIR Next remote transmission per protocol. ML, CVRS    Symptomatic anemia # Chronic Anemia [BL10-11]-iron  deficiency/? CKD stage III  - not on gentle iron  [sec to dyspepsia]  venofer  /Retacrit  prn-   # Today hemoglobin is 9.8.  Iron  study shows adequate iron  levels saturation-hold Venofer  proceed with Retacrit .  Pending insurance approval she will receive Retacrit  next week.  # chronic  intermittent blood in stools - chronic diarrhea s/p [facility] evaluation-defer to GI.  # Etiology of anemia-unclear- CKD stage III  vs Others-[Dr.Lateef]/  S/p EGD/ capsule MAY 2024- NEG [hospital]; [facility] [Dr.Russo]- MAY 2024 - NEG. [chronic diarrhea/malabsorption]-monitor  closely.  If anemia is worse consider bone marrow biopsy  # Mild Hypokalemia- today- 2.8 - recommend compliance with Kdur BID;-   # CAD/History of DVT/s/p IVC filter; ?  A. Fib-Eliquis - s/p cardio-conversion  [Dr.Klein/Dr.Arida] - monitor closely on Eliquis - if increased bleeding pt will inform cardiology-   # Borderline low B12-OCT 2023- 230; s/p B12 injection.; proceed with injection q 2- 4 months.    # DISPOSITION: # HOLD venofer - HOLD  Retacrit -; HOLD B12 today # Follow up in 3 months-MD - 2-3 days PRIOR- labs-- cbc;bmp; iron  studies; ferritin- b12 levels;possible Venofer  OR retacrit ;  B12 injection- Dr.B  # Addendum: Iron  levels adequate hold off on infusion; proceed with Retacrit ; and B12 injection.  Cc; Rosaline Louder     All questions were answered. The patient knows to call the clinic with any problems, questions or concerns.    Cindy JONELLE Joe, MD 05/01/2024 4:18 PM

## 2024-04-29 NOTE — Progress Notes (Signed)
Patient states no concerns today.

## 2024-04-29 NOTE — Progress Notes (Signed)
Needs insurance auth

## 2024-04-29 NOTE — Assessment & Plan Note (Addendum)
#   Chronic Anemia [BL10-11]-iron  deficiency/? CKD stage III  - not on gentle iron  [sec to dyspepsia]  venofer  /Retacrit  prn-   # Today hemoglobin is 9.8.  Iron  study shows adequate iron  levels saturation-hold Venofer  proceed with Retacrit .  Pending insurance approval she will receive Retacrit  next week.  # chronic  intermittent blood in stools - chronic diarrhea s/p [facility] evaluation-defer to GI.  # Etiology of anemia-unclear- CKD stage III  vs Others-[Dr.Lateef]/  S/p EGD/ capsule MAY 2024- NEG [hospital]; [facility] [Dr.Russo]- MAY 2024 - NEG. [chronic diarrhea/malabsorption]-monitor closely.  If anemia is worse consider bone marrow biopsy  # Mild Hypokalemia- today- 2.8 - recommend compliance with Kdur BID;-   # CAD/History of DVT/s/p IVC filter; ?  A. Fib-Eliquis - s/p cardio-conversion [Dr.Klein/Dr.Arida] - monitor closely on Eliquis - if increased bleeding pt will inform cardiology-   # Borderline low B12-OCT 2023- 230; s/p B12 injection.; proceed with injection q 2- 4 months.    # DISPOSITION: # HOLD venofer - HOLD  Retacrit -; HOLD B12 today # Follow up in 3 months-MD - 2-3 days PRIOR- labs-- cbc;bmp; iron  studies; ferritin- b12 levels;possible Venofer  OR retacrit ;  B12 injection- Dr.B  # Addendum: Iron  levels adequate hold off on infusion; proceed with Retacrit ; and B12 injection.  Cc; Debra Saunders

## 2024-05-01 ENCOUNTER — Encounter: Payer: Self-pay | Admitting: Internal Medicine

## 2024-05-02 ENCOUNTER — Telehealth: Payer: Self-pay | Admitting: Family

## 2024-05-02 ENCOUNTER — Other Ambulatory Visit: Payer: Self-pay | Admitting: Cardiology

## 2024-05-02 NOTE — Telephone Encounter (Signed)
 Called to confirm/remind patient of their appointment at [facility] on 05/03/24.   Appointment:   [x] Confirmed  [] Left mess   [] No answer/No voice mail  [] VM Full/unable to leave message  [] Phone not in service  Patient reminded to bring all medications and/or complete list.  Confirmed patient has transportation. Gave directions, instructed to utilize valet parking.

## 2024-05-02 NOTE — Progress Notes (Unsigned)
 Date:  05/02/2024   ID:  Debra Saunders, DOB 11-01-1945, MRN [id number]   Provider location: [facility] Type of Visit: Established patient   PCP:  Abbey Bruckner, MD  Cardiologist:  Deatrice Cage, MD HF Cardiologist: Dr. Rolan  Chief Complaint: HF follow up   History of Present Illness: Debra Saunders is a 78 y.o. 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   Admitted 10/24-10/29/19 with acute on chronic diastolic HF. HF team consulted with concerns for low output. PICC was placed and milrinone  to support RV function was started along with IV Lasix  to facilitate diuresis. RHC done to evaluate PAH (see below). V/Q scan negative. High res CT showed pulmonary fibrosis. Pulmonary consult and recommended stopping amiodarone . She was transitioned to torsemide  60 mg BID. DC weight: 226 lbs.    She has started on selexipag  for pulmonary hypertension in addition to Opsumit  and Revatio .      Echo in 3/21 showed EF 60-65%, normal RV, PASP 50 mmHg, moderate-severe MR.   Had RHC 08/30/2020- low/normal filling pressures, mild pumonary htn, and preserved cardiac. output.   Admitted to [facility] 1/23 with endocarditis and PNA, requiring pressors. BCx positive for Streptococcus group G.  All her PAH meds were held. TEE showed a large vegetation on the pacer wire in the right atrium above the tricuspid valve (1 x 1.5 cm).  No vegetations noted on the valves.  EF was 55% and there was note of mild to moderate mitral regurgitation. RV ok. She was transferred to [facility] for pacemaker extraction. EP felt she was not a candidate due to multiple comorbidities. ID consulted and  planned for 6 weeks of IV abx. She developed Afib with RVR and started on amio. She underwent successful TEE-guided DCCV 07/14/21. Hospitalization complicated by anemia, hypotension, and AKI.  Midodrine  started during admission. She was discharged to SNF, weight 198 lbs.  Admitted 3/23 with lightheadedness. Treated for sinus infection, HF meds held due to othostasis. Midodrine  increased to 10 mg tid. HH PT arranged.  Repeat echo in 3/23 showed EF 60-65%, mild LVH, normal RV, IVC normal, no definite vegetation noted.   Follow up 8/23, continued with dyspnea, despite euvolemia. Amiodarone  stopped, in case this was causing her symptoms, and arranged for RHC. RHC showed showed normal filling and PA pressures. Felt symptoms were pulmonary-driven.  She was admitted in 1/25 with chest pain. She was in atrial fibrillation. She was diuresed, RHC/LHC was done showing minimal CAD and moderate pulmonary hypertension with normal filling pressures after diuresis.   She was started on Tikosyn  in 4/25 then cardioverted to NSR.    Today she returns for HF follow up with her daughter.  Weight is down 7 lbs compared to last appointment. She uses 4L home oxygen  during the day and CPAP at night. She is using midodrine  and denies lightheadedness with standing.  Rare palpitations, she is in NSR today.  She does ok walking around her house, no dyspnea as long as she is wearing oxygen . She is able to cook and clean if she takes it slowly.  She raises the head of her bed chronically.  No chest pain.    ECG (5/25, personally reviewed):  AV dual pacing with paced QTc 502 msec  [employer] device interrogation: rare AF, 98.6% V-pacing  6 minute walk (1/20): 98 m 6 minute walk (7/20): 213 m 6 minute walk (3/22): 198 m 6 minute walk (10/22): 229 m   Labs (6/24): K 3.6, creatinine 0.93 Labs (8/24): K 4.4, creatinine 0.97 Labs (1/25): K 3.9, creatinine 0.68, BNP 336 Labs (5/25): K 4.7, creatinine 1.07   PMH: 1.  Lymphedema: Left lower leg (chronic).  2. COPD 3. Pulmonary fibrosis/ILD: On home oxygen .  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  - PFTs (2017) with FEV1 64%, FEV1/FVC ratio 106%, TLC 49%, DLCO 39% (restrictive).  - CT chest high resolution (10/19): Pulmonary parenchymal pattern of fibrosis.  - CT chest (4/22): Chronic ILD 4. Atrial fibrillation: Paroxysmal.  She was previously off amiodarone  due to lung disease.  - 2/19 DCCV.  - 1/23 DCCV - 4/25 started Tikosyn  and cardioverted to NSR.  5. H/o DVT s/p IVC filter in 2004.  6. CAD: 4/19 coronary angiography with minimal coronary disease.  - LHC (1/25): Minimal coronary disease.  7. Bradycardia: [employer] PPM.  8. H/o traumatic SAH 2014.  9. CKD: Stage 3.  10. H/o CVA 11. OSA: CPAP 12. HTN 13. Type II diabetes 14. Pulmonary hypertension:  - PFTs 2017 with severe restriction.  - Echo (10/19): EF 60-65%, PASP 52 mmHg - RHC (4/19): Severe PAH with PVR 7.2 WU.  - RHC (10/19): mean RA 6, PA 56/17 mean 33, PCWP mean 12, CI 3.23, PVR 3.4 WU.  - RHC (3/22): mean 3, RV 30/1, PA 33/11, mean 20, PCWP mean 6, Oxygen  saturations: PA 69% AO 100%, Cardiac Output (Fick) 6.18, Cardiac Index (Fick) 3.36, and PVR 2.3 WU - V/Q scan (10/19): No chronic PE.  - Anti-SCL 70 and ANA negative.  ACE normal.  RF slightly elevated at 15.  - Echo (3/21): EF 60-65%, normal RV, PASP 50 mmHg, moderate-severe MR. - TEE (1/23): EF 60-65%, mild RV dilation with normal RV systolic function, MR mild-moderate, moderate TR with vegetation on TV and pacemaker lead - Echo (1/23): EF 60-65%, normal RV, RVSP 51.5 mmHg, mild-moderate MR, moderate TR - Echo (3/23): EF 60-65%, mild LVH, normal RV, IVC normal, no definite vegetation noted.  - RHC (8/23): RA 3, PA 29/10 (mean 16), PCWP 12, CO/CI (Fick) 6.18/3.37 - RHC (1/25): mean RA 5, PA 54/20 mean 29, mean PCWP 13, CI 2.36, PVR 3.7 WU.  15. 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  16. Mitral regurgitation: moderate to  severe on 3/22 echo.  17. Zio patch (3/22): 9.3% PACs, no AF, 2.9% PVCs 18. 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  Deemed too high risk to remove device so have tried to treat through.  19. Fe deficiency anemia 20. B12 deficiency  FH: mother with CHF  Current Outpatient Medications  Medication Sig Dispense Refill   acetaminophen  (TYLENOL ) 325 MG tablet Take 325-650 mg by mouth every 6 (six) hours as needed for mild pain.     albuterol  (ACCUNEB ) 0.63 MG/3ML nebulizer solution Take 1 ampule by nebulization every 6 (six) hours as needed for wheezing.     apixaban  (ELIQUIS ) 5 MG TABS tablet Take 1 tablet (5 mg total) by mouth 2 (two) times daily. RESUME SUNDAY pm 180 tablet 3   atropine  1 % ophthalmic solution Place 1 drop into both eyes at bedtime.     cefadroxil  (DURICEF) 500 MG capsule Take 1 capsule by mouth twice daily 60 capsule 6  dapagliflozin  propanediol (FARXIGA ) 10 MG TABS tablet TAKE 1 TABLET BY MOUTH ONCE DAILY BEFORE BREAKFAST 90 tablet 2   dofetilide  (TIKOSYN ) 250 MCG capsule Take 1 capsule (250 mcg total) by mouth 2 (two) times daily. 180 capsule 2   gabapentin  (NEURONTIN ) 600 MG tablet Take 1 tablet (600 mg total) by mouth at bedtime. 90 tablet 1   macitentan  (OPSUMIT ) 10 MG tablet Take 1 tablet (10 mg total) by mouth daily. 30 tablet 11   meclizine  (ANTIVERT ) 25 MG tablet Take 1 tablet (25 mg total) by mouth 3 (three) times daily as needed for dizziness. 30 tablet 1   Menthol , Topical Analgesic, (BIOFREEZE) 10 % CREA Apply 1 Application topically daily as needed (apply to back for pain).     midodrine  (PROAMATINE ) 5 MG tablet TAKE 1 TABLET BY MOUTH THREE TIMES DAILY WITH MEALS 90 tablet 0   OXYGEN  Inhale 4 L/min into the lungs continuous.     potassium chloride  SA (KLOR-CON  M) 20 MEQ tablet TAKE 2 TABLETS BY MOUTH IN THE MORNING AND 1 IN THE EVENING. PLEASE SCHEDULE APPOINTMENT FOR MORE REFILLS 90 tablet 0   prednisoLONE  acetate (PRED FORTE ) 1 %  ophthalmic suspension Place 1 drop into both eyes 2 (two) times daily.     rosuvastatin  (CRESTOR ) 10 MG tablet Take 1 tablet (10 mg total) by mouth daily. 90 tablet 3   sildenafil  (REVATIO ) 20 MG tablet Take 1 tablet (20 mg total) by mouth 3 (three) times daily. 270 tablet 3   spironolactone  (ALDACTONE ) 25 MG tablet Take 1 tablet by mouth daily.     torsemide  (DEMADEX ) 20 MG tablet Take 3 tablets (60 mg total) by mouth daily. 200 tablet 3   UPTRAVI  1600 MCG TABS Take 1 tablet twice daily. 60 tablet 11   Vitamin D , Ergocalciferol , (DRISDOL ) 1.25 MG (50000 UNIT) CAPS capsule Take 1 capsule (50,000 Units total) by mouth every 7 (seven) days. 12 capsule 3   Current Facility-Administered Medications  Medication Dose Route Frequency Provider Last Rate Last Admin   diclofenac  Sodium (VOLTAREN ) 1 % topical gel 2 g  2 g Topical QID        Allergies:   Aspirin  and Other   Social History:  The patient  reports that she quit smoking about 33 years ago. Her smoking use included cigarettes. She started smoking about 35 years ago. She has a 0.6 pack-year smoking history. She has been exposed to tobacco smoke. She has never used smokeless tobacco. She reports that she does not drink alcohol and does not use drugs.   Family History:  The patient's family history includes Bone cancer in her sister; Breast cancer in her maternal aunt and sister; COPD in her mother; Heart disease in her mother; Hypertension in her father, mother, sister, and sister; Stroke in her son; Thyroid  disease in her mother, sister, and sister.   ROS:  Please see the history of present illness.   All other systems are personally reviewed and negative.   Recent Labs: 07/27/2023: BNP 170.6 09/29/2023: ALT 9 11/11/2023: Magnesium  2.4 04/29/2024: BUN 23; Creatinine 1.00; Hemoglobin 9.8; Platelet Count 174; Potassium 2.8; Sodium 138  Personally reviewed   Wt Readings from Last 3 Encounters:  04/29/24 192 lb 11.2 oz (87.4 kg)  04/19/24 192 lb  (87.1 kg)  03/29/24 189 lb (85.7 kg)    There were no vitals taken for this visit.  Physical Exam General: NAD Neck: No JVD, no thyromegaly or thyroid  nodule.  Lungs: Clear to auscultation bilaterally  with normal respiratory effort. CV: Nondisplaced PMI.  Heart regular S1/S2, no S3/S4, 2/6 SEM RUSB.  Chronic left leg lymphedema.  No carotid bruit.  Normal pedal pulses.  Abdomen: Soft, nontender, no hepatosplenomegaly, no distention.  Skin: Intact without lesions or rashes.  Neurologic: Alert and oriented x 3.  Psych: Normal affect. Extremities: No clubbing or cyanosis.  HEENT: Normal.   Assessment & Plan 1. Atrial fibrillation with tachy-brady syndrome:  Had been off amiodarone  since 2019 due to interstitial lung disease/pulmonary fibrosis. Amiodarone  restarted during 1/23 admission. s/p TEE-guided DCCV 1/23, amiodarone  later stopped again.  Tikosyn  begun 4/25 and she was cardioverted back to NSR.  She is in NSR today. Paced QTc is acceptable on today's ECG.  - Continue Tikosyn  250 mcg bid.    - Continue Eliquis  5 mg bid.   2. 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 Echo in 3/23 showed EF 60-65%, mild LVH, normal RV, IVC normal, no definite vegetation noted. She has been on midodrine  for chronic hypotension but BP has been higher recently.  Repeat RHC (8/23) showed normal filling and PA pressures. RHC in 1/25 showed PA 54/20 with PVR 3.7 WU.  NYHA class II-III, not volume overloaded.  Seems to be doing better in NSR.   - Continue torsemide  60 mg daily.  Recent BMET stable.  - Continue spironolactone  25 mg daily.  - Continue Farxiga  10 mg daily.  - I will arrange echo.  3. Pulmonary hypertension: Based on 4/19 cath, she had severe PAH with PVR 7.2 WU. Chronic hypoxemic respiratory failure, on home oxygen .  She has a history of  remote DVT (2004) but V/Q scan negative for evidence of chronic PE.  High resolution CT chest in 10/19 showed pulmonary fibrosis, and PFTs in 2017 showed severe restriction.  Serological workup for rheumatological diseases was unremarkable.  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 Amiodarone  was stopped in past given lung parenchymal disease. 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 RHC (8/23) showed normal filling and PA pressures. RHC in 1/25 showed PA 54/20 with PVR 3.7 WU. - Continue macitentan  10 daily.  - Continue Selexipag  1600 bid  - Continue sildenafil  20 mg tid.  - Continue home oxygen  and CPAP.  4. Endocarditis: in 1/23.  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  Group G strep bacteremia.  Septic emboli to lungs. Seen by EP, thought to be too high risk for device extraction. Unfortunately she will be at high risk for complications going forward.  - She is now on lifetime suppression with cefadroxil .  5. Chronic hypoxemic respiratory failure:  At baseline, on 4L home oxygen  with CPAP at night for interstitial lung disease and OSA.   - Continue home O2 4 L by [place] (baseline).  - Continue CPAP.  6. H/o DVT: Anticoagulated.  7. Left leg lymphedema: Chronic, unchanged.  8. OSA: CPAP.   9. Orthostatic hypotension: She was unable to decrease midodrine , will continue.   10. Bradycardia: has [employer] PPM.   Followup in 3 months.   I spent 31 minutes reviewing records, interviewing/examining patient, and managing orders.   Signed, Ellouise DELENA Class, FNP  05/02/2024  [facility] [address] [facility] [place] [address] [phone]  [phone] (office) [phone] (fax)

## 2024-05-03 ENCOUNTER — Encounter: Payer: Self-pay | Admitting: Family

## 2024-05-03 ENCOUNTER — Other Ambulatory Visit
Admission: RE | Admit: 2024-05-03 | Discharge: 2024-05-03 | Disposition: A | Discharge status: Home / Self Care | Source: Ambulatory Visit | Attending: Family | Admitting: Family

## 2024-05-03 ENCOUNTER — Ambulatory Visit: Admitting: Family

## 2024-05-03 VITALS — BP 116/72 | HR 61 | Wt 193.5 lb

## 2024-05-03 DIAGNOSIS — I82402 Acute embolism and thrombosis of unspecified deep veins of left lower extremity: Secondary | ICD-10-CM

## 2024-05-03 DIAGNOSIS — I38 Endocarditis, valve unspecified: Secondary | ICD-10-CM

## 2024-05-03 DIAGNOSIS — I5032 Chronic diastolic (congestive) heart failure: Secondary | ICD-10-CM | POA: Insufficient documentation

## 2024-05-03 DIAGNOSIS — R001 Bradycardia, unspecified: Secondary | ICD-10-CM

## 2024-05-03 DIAGNOSIS — I890 Lymphedema, not elsewhere classified: Secondary | ICD-10-CM | POA: Diagnosis not present

## 2024-05-03 DIAGNOSIS — I951 Orthostatic hypotension: Secondary | ICD-10-CM | POA: Diagnosis not present

## 2024-05-03 DIAGNOSIS — I272 Pulmonary hypertension, unspecified: Secondary | ICD-10-CM

## 2024-05-03 DIAGNOSIS — G4733 Obstructive sleep apnea (adult) (pediatric): Secondary | ICD-10-CM | POA: Diagnosis not present

## 2024-05-03 DIAGNOSIS — I4819 Other persistent atrial fibrillation: Secondary | ICD-10-CM | POA: Diagnosis not present

## 2024-05-03 LAB — POTASSIUM: Potassium: 3.3 mmol/L — ABNORMAL LOW (ref 3.5–5.1)

## 2024-05-03 MED ORDER — MECLIZINE HCL 25 MG PO TABS: 25.0000 mg | ORAL_TABLET | Freq: Three times a day (TID) | ORAL | 1 refills | Status: AC | PRN

## 2024-05-03 NOTE — Patient Instructions (Signed)
 Medication Changes:  No medication changes today!  Lab Work:  Go over to the [facility]. Go pass the gift shop and have your blood work completed.   We will only call you if the results are abnormal or if the provider would like to make medication changes.  No news is good news.    Follow-Up in: Please follow up with the [facility] in 3 months with Dr. Cherrie. We do not currently have that schedule. Please give us  a call in January 2026 in order to schedule your appointment for February 2026.   Thank you for choosing [facility].    At the [facility], you and your health needs are our priority. We have a designated team specialized in the treatment of Heart Failure. This Care Team includes your primary Heart Failure Specialized Cardiologist (physician), Advanced Practice Providers (APPs- Physician Assistants and Nurse Practitioners), and Pharmacist who all work together to provide you with the care you need, when you need it.   You may see any of the following providers on your designated Care Team at your next follow up:  Dr. Toribio Cherrie Dr. Ezra Shuck Dr. Ria Commander Dr. Morene Brownie Ellouise Class, FNP Jaun Bash, RPH-CPP  Please be sure to bring in all your medications bottles to every appointment.   Need to Contact Us :  If you have any questions or concerns before your next appointment please send us  a message through [place] or call our office at [phone].    TO LEAVE A MESSAGE FOR THE NURSE SELECT OPTION 2, PLEASE LEAVE A MESSAGE INCLUDING: YOUR NAME DATE OF BIRTH CALL BACK NUMBER REASON FOR CALL**this is important as we prioritize the call backs  YOU WILL RECEIVE A CALL BACK THE SAME DAY AS LONG AS YOU CALL BEFORE 4:00 PM

## 2024-05-04 ENCOUNTER — Ambulatory Visit: Payer: Self-pay | Admitting: Family

## 2024-05-17 ENCOUNTER — Other Ambulatory Visit: Payer: Self-pay

## 2024-05-17 NOTE — Patient Outreach (Signed)
 Complex Care Management   Visit Note  05/17/2024  Name:  GERALDYNE BARRACLOUGH MRN: [id number] DOB: 07-20-1945  Situation: Referral received for Complex Care Management related to Heart Failure, Atrial Fibrillation, and Anemia I obtained verbal consent from Patient.  Visit completed with Caregiver Patient  on the phone  Background:   Past Medical History:  Diagnosis Date   Abdominal pain 07/06/2021   Acute hypokalemia 07/18/2023   Acute on chronic heart failure with preserved ejection fraction (HFpEF) (HCC) 07/17/2023   Arthritis    Atrial fibrillation, persistent (HCC)    a. s/p TEE-guided DCCV on 08/20/2017; b. 06/2021 Recurrent Afib-->amio/TEE/DCCV.   CAD (coronary artery disease)    a. 01/2012 Cath: nonobs dzs; b. 04/2014 MV: No ischemia; c. 09/2017 Cath: Minimal nonobs RCA dzs, otw nl cors. 09/2023: Tikosyn  loading   Cardiorenal syndrome    a. 09/2017 Creat rose to 2.7 w/ diuresis-->HD x 2.   Chronic back pain    Chronic combined systolic (congestive) and diastolic (congestive) heart failure (HCC)    a. Dx 2005 @ [facility];  b. 02/2014 Echo: EF 55-60%; c. 07/2017: EF 30-35%; d. 09/2017 Echo: EF 40-45%; e. 02/2018 Echo: EF 55-60%; f. 08/2019 Echo: EF 60-65%; g. 08/2020 RHC: RA 3, RV 30/1, PA 33/11 (20), PCWP 6; f. 08/2021 Echo: EF 60-65%, no rwma, GrIDD, nl RV fxn, RVSP 40.4mmHg, mod dil LA, mod TR.   Chronic respiratory failure (HCC)    a. on home O2   Gastritis    History of COVID-19    History of endocarditis 08/22/2021   History of intracranial hemorrhage    a. 6/14 described by neurosurgery as small frontal posttraumatic SAH  - pt was on xarelto  @ the time.   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 Chronic LLE edema.   NICM (nonischemic cardiomyopathy) (HCC)    a. 09/2017 Echo: EF 40-45%; b. 09/2017 Cath: minor, insignificant CAD; c. 02/2018 Echo: EF 55-60%, Gr1 DD; d.  08/2019 Echo: EF 60-65%, Gr2 DD.   Non-ST elevation (NSTEMI) myocardial infarction (HCC) 07/18/2023   Obesity    PAH (pulmonary artery hypertension) (HCC)    a. 09/2017 Echo: PASP ; b. 09/2017 RHC: RA 23, RV 84/13, PA 84/29, PCWP 20, CO 3.89, CI 1.89. LVEDP 18; c. 08/2020 RHC: RA 3, RV 30/1, PA 33/11 (20), PCWP 6. CO/CI 6.18/3.36. PVR 2.3 WU.   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 06/2021. Veg on PPM lead. Not candidate for extraction-->s/p 6 wks abx.   Sepsis with metabolic encephalopathy (HCC)    Sleep apnea    Streptococcal bacteremia    Stroke [facility]) 2014   Subarachnoid hemorrhage (HCC) 01/19/2013   Symptomatic bradycardia    a. s/p BSX PPM.   Thyroid  nodule    Type II diabetes mellitus (HCC)    Urinary incontinence     Assessment: Patient Reported Symptoms:  Cognitive Cognitive Status: No symptoms reported Cognitive/Intellectual Conditions Management [RPT]: None reported or documented in medical history or problem list   Health Maintenance Behaviors: Annual physical exam  Neurological Neurological Review of Symptoms: No symptoms reported Neurological Management Strategies: Routine screening Neurological Comment: hot flashes improved  HEENT HEENT Symptoms Reported: No symptoms reported HEENT Management Strategies: Routine screening, Medication therapy    Cardiovascular Cardiovascular Symptoms Reported: Swelling in legs  or feet Cardiovascular Comment: swelling in legs stable, 189 lb reports weight gain off and on - takes extra torsemide  as needed per rescue plan, O2 4L red flags for ED discussed, reports could not get B12 r infusion thismonth, worse fatigue and weakness, delcined to make appointment with PCP  Respiratory Other Respiratory Symptoms: O2 4L [place] not checking pulse ox unless feeling bad, SOB normal, checked while on phone 98% HR 64 Respiratory Management Strategies: CPAP, Oxygen  therapy, Routine screening   Endocrine Endocrine Symptoms Reported: Not assessed Is patient diabetic?: No Is patient checking blood sugars at home?: No    Gastrointestinal Gastrointestinal Symptoms Reported: Diarrhea Additional Gastrointestinal Details: diarrhea - 3-4x day whenever eats/drinks - GI 06/06/24, noted blood in diarrhea, started last week, bright red, increased this week, sometimes just blood, some small clots in BM, attempted conference call with daughter Dedra, left a message requesting call back urgently, patient reports increased weakness and fatigue and patient was attributing to no B12 shot or infusion this month - recalled Dedra x2 and connected, spoke with daughter re abpve and then patient and daughter, reluctant and did not agree would go to ED as advised, explained threat of active bleed to health and life and given red flags for 911. PCP notified Gastrointestinal Management Strategies: Incontinence garment/pad    Genitourinary Genitourinary Symptoms Reported: No symptoms reported    Integumentary Integumentary Symptoms Reported: No symptoms reported Skin Management Strategies: Routine screening  Musculoskeletal Musculoskelatal Symptoms Reviewed: Limited mobility, Unsteady gait, Weakness Additional Musculoskeletal Details: rollator Musculoskeletal Management Strategies: Medication therapy, Routine screening Falls in the past year?: No Number of falls in past year: 1 or less Patient at Risk for Falls Due to: History of fall(s), Impaired balance/gait, Impaired mobility Fall risk Follow up: Falls evaluation completed, Falls prevention discussed  Psychosocial Psychosocial Symptoms Reported: No symptoms reported Other Psychosocial Conditions: very fatigued and weak - advised ED due to GI bleeding          05/17/2024    PHQ2-9 Depression Screening   Little interest or pleasure in doing things Not at all  Feeling down, depressed, or hopeless Not at all  PHQ-2 - Total Score 0  Trouble falling or  staying asleep, or sleeping too much    Feeling tired or having little energy    Poor appetite or overeating     Feeling bad about yourself - or that you are a failure or have let yourself or your family down    Trouble concentrating on things, such as reading the newspaper or watching television    Moving or speaking so slowly that other people could have noticed.  Or the opposite - being so fidgety or restless that you have been moving around a lot more than usual    Thoughts that you would be better off dead, or hurting yourself in some way    PHQ2-9 Total Score    If you checked off any problems, how difficult have these problems made it for you to do your work, take care of things at home, or get along with other people    Depression Interventions/Treatment      There were no vitals filed for this visit.    Medications Reviewed Today     Reviewed by Devra Lands, RN (Registered Nurse) on 05/17/24 at 1341  Med List Status: <None>   Medication Order Taking? Sig Documenting Provider Last Dose Status Informant  acetaminophen  (TYLENOL ) 325 MG tablet [id number] Yes Take 325-650 mg by mouth every 6 (  six) hours as needed for mild pain. [provider]  Active Self           Med Note EFRAIM, ALFREIDA CROME   Tue Sep 29, 2023  2:49 PM)    albuterol  (ACCUNEB ) 0.63 MG/3ML nebulizer solution [id number] Yes Take 1 ampule by nebulization every 6 (six) hours as needed for wheezing. [provider]  Active Self  apixaban  (ELIQUIS ) 5 MG TABS tablet [id number] Yes Take 1 tablet (5 mg total) by mouth 2 (two) times daily. RESUME SUNDAY pm Fernande Elspeth BROCKS, MD  Active Self  atropine  1 % ophthalmic solution [id number] Yes Place 1 drop into both eyes at bedtime. [provider]  Active Self  cefadroxil  (DURICEF) 500 MG capsule [id number] Yes Take 1 capsule by mouth twice daily Ravishankar, Donald, MD  Active   dapagliflozin  propanediol (FARXIGA ) 10 MG TABS tablet [id number] Yes TAKE 1  TABLET BY MOUTH ONCE DAILY BEFORE BREAKFAST Riddle, Suzann, NP  Active   diclofenac  Sodium (VOLTAREN ) 1 % topical gel 2 g [id number]   Bair, Kalpana, MD  Active   dofetilide  (TIKOSYN ) 250 MCG capsule [id number] Yes Take 1 capsule (250 mcg total) by mouth 2 (two) times daily. Riddle, Suzann, NP  Active   gabapentin  (NEURONTIN ) 600 MG tablet [id number] Yes Take 1 tablet (600 mg total) by mouth at bedtime. Bair, Kalpana, MD  Active   macitentan  (OPSUMIT ) 10 MG tablet [id number] Yes Take 1 tablet (10 mg total) by mouth daily. Rolan Ezra RAMAN, MD  Active Self  meclizine  (ANTIVERT ) 25 MG tablet [id number] Yes Take 1 tablet (25 mg total) by mouth 3 (three) times daily as needed for dizziness. [place] A, FNP  Active   Menthol , Topical Analgesic, (BIOFREEZE) 10 % CREA [id number] Yes Apply 1 Application topically daily as needed (apply to back for pain). [provider]  Active   midodrine  (PROAMATINE ) 5 MG tablet [id number] Yes TAKE 1 TABLET BY MOUTH THREE TIMES DAILY WITH MEALS Zenaida Morene PARAS, MD  Active   OXYGEN  [id number] Yes Inhale 4 L/min into the lungs continuous. [provider]  Active   potassium chloride  SA (KLOR-CON  M) 20 MEQ tablet [id number] Yes TAKE 2 TABLETS BY MOUTH IN THE MORNING AND 1 IN THE EVENING. PLEASE SCHEDULE APPOINTMENT FOR MORE REFILLS Rolan Ezra RAMAN, MD  Active   prednisoLONE  acetate (PRED FORTE ) 1 % ophthalmic suspension [id number] Yes Place 1 drop into both eyes 2 (two) times daily. [provider]  Active   rosuvastatin  (CRESTOR ) 10 MG tablet [id number] Yes Take 1 tablet (10 mg total) by mouth daily. Rolan Ezra RAMAN, MD  Active   sildenafil  (REVATIO ) 20 MG tablet [id number] Yes Take 1 tablet (20 mg total) by mouth 3 (three) times daily. Rolan Ezra RAMAN, MD  Active   spironolactone  (ALDACTONE ) 25 MG tablet [id number] Yes Take 1 tablet by mouth daily. [provider]  Active Self  sucralfate  (CARAFATE ) 1 g tablet [id number] Yes Take 1 g by mouth  2 (two) times daily. [provider]  Active   torsemide  (DEMADEX ) 20 MG tablet [id number] Yes Take 3 tablets (60 mg total) by mouth daily. Rolan Ezra RAMAN, MD  Active   UPTRAVI  1600 MCG TABS [id number] Yes Take 1 tablet twice daily. Rolan Ezra RAMAN, MD  Active   Vitamin D , Ergocalciferol , (DRISDOL ) 1.25 MG (50000 UNIT) CAPS capsule [id number] Yes Take 1 capsule (50,000 Units total) by mouth every 7 (seven) days. Abbey Bruckner, MD  Active  Recommendation:   YOU HAVE BEEN ADVISED TO GO TO THE ER TODAY FOR GI BLEED - PCP notified.   Follow Up Plan:   Telephone follow-up 3 Dandrea Widdowson  [name], MSN, RN [facility]  [facility], [facility] RN Care Manager Direct Dial: [phone] Fax: [phone]

## 2024-05-17 NOTE — Patient Instructions (Addendum)
 Visit Information  Thank you for taking time to visit with me today. Please don't hesitate to contact me if I can be of assistance to you before our next scheduled appointment.  Your next care management appointment is by telephone on 06/06/2024 at 2:00 pm  You have been advised to go to ED today for GI bleed - PCP has been notified.   Telephone follow-up 3 Debra Saunders  Please call the care guide team at [phone] if you need to cancel, schedule, or reschedule an appointment.   Please call the Suicide and Crisis Lifeline: 988 call the [place]  National Suicide Prevention Lifeline: 918-690-0370 or TTY: 517-262-5538 TTY 712-737-8761) to talk to a trained counselor call 1-800-273-TALK (toll free, 24 hour hotline) go to [facility] [address], [place] [phone]) call 911 if you are experiencing a Mental Health or Behavioral Health Crisis or need someone to talk to.  [name], MSN, RN B and [facility]  [facility], [facility] RN Care Manager Direct Dial: [phone] Fax: [phone]   Gastrointestinal Bleeding Gastrointestinal (GI) bleeding is bleeding anywhere along the digestive tract. The digestive tract carries food from your mouth until it leaves your body as waste, or poop. You may have blood in your poop or have black poop. If you vomit, there may be blood in it too. This condition can be mild, serious, or even life-threatening. If you have a lot of bleeding, you may need to stay in the hospital. What are the causes? This condition may be caused by: Irritation and swelling of the esophagus (esophagitis). The esophagus is part of the body that moves food from your mouth to your stomach. Swollen veins in the butt (hemorrhoids). Tears in the opening of the butt. These are often caused by passing hard poop. Pouches that form on the colon (diverticulosis). Irritation and swelling of pouches that have formed in your  colon (diverticulitis). Growths (polyps) or cancer. Irritation of the stomach lining (gastritis). Sores (ulcers) in the stomach. What increases the risk? You are more likely to develop this condition if: You have a certain type of infection in your stomach called Helicobacter pylori infection. You take certain medicines. You smoke. You drink alcohol. What are the signs or symptoms? Common symptoms of this condition include: Vomiting material that has bright red blood in it. It may look like coffee grounds. Changes in your poop. The poop: May have red blood in it. May be black, look like tar, and smell stronger than normal. May be red. Pain or cramping in the belly (abdomen). How is this treated? Treatment for this condition depends on the cause of the bleeding. For example: Your doctor may treat the bleeding during diagnosis. Common ways of diagnosing this condition is endoscopy or colonoscopy. Medicines can be used to: Help control irritation, swelling, or infection. Reduce acid in your stomach. Certain problems can be treated with: Creams. Medicines that are put in the butt (suppositories). Warm baths. Surgery is sometimes needed. If you lose a lot of blood, you may need a blood transfusion. If there is a lot of bleeding, you will need to stay in the hospital. If bleeding is mild, you may be allowed to go home. Follow these instructions at home:  Take over-the-counter and prescription medicines only as told by your doctor. Eat foods that have a lot of fiber in them. These foods include beans, whole grains, and fresh fruits and vegetables. You can also try eating 1-3 prunes each day. Drink enough fluid  to keep your pee (urine) pale yellow. Keep all follow-up visits. Contact a doctor if: Your symptoms do not get better with treatment. Get help right away if: Your bleeding does not stop. You feel dizzy or you pass out (faint). You feel weak. You have very bad cramps in your  back or belly. You pass large clumps of blood (clots) in your poop. Your symptoms are getting worse. You have chest pain or fast heartbeats. These symptoms may be an emergency. Get help right away. Call 911. Do not wait to see if the symptoms will go away. Do not drive yourself to the hospital. Summary Gastrointestinal (GI) bleeding is bleeding anywhere along the digestive tract. The digestive tract carries food from your mouth until it leaves your body as waste, or poop. This bleeding can be caused by many things. Treatment depends on the cause of the bleeding. Take medicines only as told by your doctor. Keep all follow-up visits. This information is not intended to replace advice given to you by your health care provider. Make sure you discuss any questions you have with your health care provider. Document Revised: 01/11/2021 Document Reviewed: 01/11/2021 Elsevier Patient Education  2024 [employer].

## 2024-05-30 NOTE — Progress Notes (Unsigned)
  Electrophysiology Office Follow up Visit Note:    Date:  05/30/2024   ID:  Debra Saunders, DOB 09/30/1945, MRN [id number]  PCP:  Abbey Bruckner, MD  [facility] Cardiologist:  Deatrice Cage, MD  [facility] Electrophysiologist:  OLE ONEIDA HOLTS, MD    Interval History:     Debra Saunders is a 79 y.o. female who presents for a follow up visit.   The patient was seen by Elvie February 15, 2024.  The patient has a history of sinus node dysfunction with a permanent pacemaker.  She also has a history of endocarditis.  She was not felt to be a candidate for lead extraction and is on chronic suppressive antibiotics.  She is on dofetilide  for rhythm control.  She takes Eliquis  for stroke prophylaxis.        Past medical, surgical, social and family history were reviewed.  ROS:   Please see the history of present illness.    All other systems reviewed and are negative.  EKGs/Labs/Other Studies Reviewed:    The following studies were reviewed today:          Physical Exam:    VS:  There were no vitals taken for this visit.    Wt Readings from Last 3 Encounters:  05/03/24 193 lb 8 oz (87.8 kg)  04/29/24 192 lb 11.2 oz (87.4 kg)  04/19/24 192 lb (87.1 kg)     GEN: no distress CARD: RRR, No MRG RESP: No IWOB. CTAB.      ASSESSMENT:    No diagnosis found. PLAN:    In order of problems listed above:  #Persistent atrial fibrillation #High risk medication monitoring-dofetilide  Good control of her heart rhythm using dofetilide  Continue Eliquis  QTc acceptable for ongoing dofetilide  use  #Permanent pacemaker in situ #History of lead vegetation Manage conservatively.  Continue suppressive antibiotics.  Device functioning appropriately.  Continue remote monitoring.  I discussed my upcoming departure from [facility] during today's clinic appointment.  The patient will continue to follow-up with one of my EP partners moving forward.  Follow-up 4 months with  EP APP.  Blood work at the next appointment.  Signed, Ole Holts, MD, [facility], [facility] 05/30/2024 9:06 AM    Electrophysiology [facility]

## 2024-06-01 ENCOUNTER — Ambulatory Visit: Attending: Cardiology | Admitting: Cardiology

## 2024-06-01 ENCOUNTER — Encounter: Payer: Self-pay | Admitting: Cardiology

## 2024-06-01 ENCOUNTER — Other Ambulatory Visit: Payer: Self-pay

## 2024-06-01 VITALS — BP 100/60 | HR 68 | Ht 60.0 in | Wt 196.6 lb

## 2024-06-01 DIAGNOSIS — I4819 Other persistent atrial fibrillation: Secondary | ICD-10-CM | POA: Diagnosis not present

## 2024-06-01 DIAGNOSIS — Z950 Presence of cardiac pacemaker: Secondary | ICD-10-CM | POA: Diagnosis not present

## 2024-06-01 DIAGNOSIS — Z79899 Other long term (current) drug therapy: Secondary | ICD-10-CM

## 2024-06-01 DIAGNOSIS — I38 Endocarditis, valve unspecified: Secondary | ICD-10-CM | POA: Diagnosis not present

## 2024-06-01 LAB — CUP PACEART INCLINIC DEVICE CHECK
Date Time Interrogation Session: 20251210161159
Implantable Lead Connection Status: 753985
Implantable Lead Connection Status: 753985
Implantable Lead Implant Date: 20150720
Implantable Lead Implant Date: 20150720
Implantable Lead Location: 753859
Implantable Lead Location: 753860
Implantable Lead Model: 5076
Implantable Lead Model: 5076
Implantable Pulse Generator Implant Date: 20241009

## 2024-06-01 NOTE — Patient Instructions (Signed)
 Medication Instructions:  Your physician recommends that you continue on your current medications as directed. Please refer to the Current Medication list given to you today.  *If you need a refill on your cardiac medications before your next appointment, please call your pharmacy*  Lab Work: TODAY:  BMET and Mag  Follow-Up: At [facility], you and your health needs are our priority.  As part of our continuing mission to provide you with exceptional heart care, our providers are all part of one team.  This team includes your primary Cardiologist (physician) and Advanced Practice Providers or APPs (Physician Assistants and Nurse Practitioners) who all work together to provide you with the care you need, when you need it.  Your next appointment:   4 months  Provider:   Suzann Riddle, NP

## 2024-06-02 LAB — BASIC METABOLIC PANEL WITH GFR
BUN/Creatinine Ratio: 26 (ref 12–28)
BUN: 28 mg/dL — ABNORMAL HIGH (ref 8–27)
CO2: 24 mmol/L (ref 20–29)
Calcium: 9.4 mg/dL (ref 8.7–10.3)
Chloride: 103 mmol/L (ref 96–106)
Creatinine, Ser: 1.06 mg/dL — ABNORMAL HIGH (ref 0.57–1.00)
Glucose: 97 mg/dL (ref 70–99)
Potassium: 3.8 mmol/L (ref 3.5–5.2)
Sodium: 142 mmol/L (ref 134–144)
eGFR: 53 mL/min/1.73 — ABNORMAL LOW (ref >59)

## 2024-06-02 LAB — MAGNESIUM: Magnesium: 1.9 mg/dL (ref 1.6–2.3)

## 2024-06-03 ENCOUNTER — Other Ambulatory Visit (HOSPITAL_COMMUNITY): Payer: Self-pay | Admitting: Cardiology

## 2024-06-06 ENCOUNTER — Other Ambulatory Visit: Payer: Self-pay | Admitting: Family

## 2024-06-06 ENCOUNTER — Other Ambulatory Visit: Payer: Self-pay

## 2024-06-06 MED ORDER — DAPAGLIFLOZIN PROPANEDIOL 10 MG PO TABS: 10.0000 mg | ORAL_TABLET | Freq: Every day | ORAL | 2 refills | Status: AC

## 2024-06-06 NOTE — Patient Outreach (Signed)
 Complex Care Management   Visit Note  06/06/2024  Name:  Debra Saunders MRN: [id number] DOB: 1945-11-09  Situation: Referral received for Complex Care Management related to Heart Failure I obtained verbal consent from Patient.  Visit completed with Patient  on the phone  Background:   Past Medical History:  Diagnosis Date   Abdominal pain 07/06/2021   Acute hypokalemia 07/18/2023   Acute on chronic heart failure with preserved ejection fraction (HFpEF) (HCC) 07/17/2023   Arthritis    Atrial fibrillation, persistent (HCC)    a. s/p TEE-guided DCCV on 08/20/2017; b. 06/2021 Recurrent Afib-->amio/TEE/DCCV.   CAD (coronary artery disease)    a. 01/2012 Cath: nonobs dzs; b. 04/2014 MV: No ischemia; c. 09/2017 Cath: Minimal nonobs RCA dzs, otw nl cors. 09/2023: Tikosyn  loading   Cardiorenal syndrome    a. 09/2017 Creat rose to 2.7 w/ diuresis-->HD x 2.   Chronic back pain    Chronic combined systolic (congestive) and diastolic (congestive) heart failure (HCC)    a. Dx 2005 @ [facility];  b. 02/2014 Echo: EF 55-60%; c. 07/2017: EF 30-35%; d. 09/2017 Echo: EF 40-45%; e. 02/2018 Echo: EF 55-60%; f. 08/2019 Echo: EF 60-65%; g. 08/2020 RHC: RA 3, RV 30/1, PA 33/11 (20), PCWP 6; f. 08/2021 Echo: EF 60-65%, no rwma, GrIDD, nl RV fxn, RVSP 40.33mmHg, mod dil LA, mod TR.   Chronic respiratory failure (HCC)    a. on home O2   Gastritis    History of COVID-19    History of endocarditis 08/22/2021   History of intracranial hemorrhage    a. 6/14 described by neurosurgery as small frontal posttraumatic SAH  - pt was on xarelto  @ the time.   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 Chronic LLE edema.   NICM (nonischemic cardiomyopathy) (HCC)    a. 09/2017 Echo: EF 40-45%; b. 09/2017 Cath: minor, insignificant CAD; c. 02/2018 Echo: EF 55-60%, Gr1 DD; d. 08/2019 Echo: EF 60-65%, Gr2 DD.   Non-ST  elevation (NSTEMI) myocardial infarction (HCC) 07/18/2023   Obesity    PAH (pulmonary artery hypertension) (HCC)    a. 09/2017 Echo: PASP ; b. 09/2017 RHC: RA 23, RV 84/13, PA 84/29, PCWP 20, CO 3.89, CI 1.89. LVEDP 18; c. 08/2020 RHC: RA 3, RV 30/1, PA 33/11 (20), PCWP 6. CO/CI 6.18/3.36. PVR 2.3 WU.   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 06/2021. Veg on PPM lead. Not candidate for extraction-->s/p 6 wks abx.   Sepsis with metabolic encephalopathy (HCC)    Sleep apnea    Streptococcal bacteremia    Stroke [facility]) 2014   Subarachnoid hemorrhage (HCC) 01/19/2013   Symptomatic bradycardia    a. s/p BSX PPM.   Thyroid  nodule    Type II diabetes mellitus (HCC)    Urinary incontinence     Assessment: Patient Reported Symptoms:  Cognitive Cognitive Status: No symptoms reported Cognitive/Intellectual Conditions Management [RPT]: None reported or documented in medical history or problem list   Health Maintenance Behaviors: Annual physical exam  Neurological Neurological Review of Symptoms: No symptoms reported    HEENT HEENT Symptoms Reported: No symptoms reported      Cardiovascular Cardiovascular Symptoms Reported: Swelling in legs or feet Weight: 196 lb (88.9 kg) Cardiovascular Comment: swelling in legs no worse, worsening fatigue, saw Dr  Cindie 06/01/24 - increased rate for Pacer, did not see improvement but no worse O2 4L, no B12 infusion until 07/29/2024, weight 196 lb, aware of rescue plan - extra torsemide  reviewed HF Action Plan  Respiratory Respiratory Symptoms Reported: Shortness of breath Other Respiratory Symptoms: O2 4L, SOB with activity unchanged Respiratory Management Strategies: CPAP, Oxygen  therapy, Routine screening, Medication therapy  Endocrine Endocrine Symptoms Reported: Not assessed Is patient diabetic?: No    Gastrointestinal Additional Gastrointestinal Details: diarrhea continues spoke with GI and  stated told bleeding likely from frequent diarrhea and to call if worse, large amounts bright red bleeding/clots to ED Gastrointestinal Management Strategies: Incontinence garment/pad    Genitourinary Genitourinary Symptoms Reported: No symptoms reported    Integumentary Integumentary Symptoms Reported: No symptoms reported    Musculoskeletal Musculoskelatal Symptoms Reviewed: Limited mobility, Difficulty walking, Weakness Additional Musculoskeletal Details: rollator, no falls Musculoskeletal Management Strategies: Routine screening, Medical device, Medication therapy Falls in the past year?: No Number of falls in past year: 1 or less Was there an injury with Fall?: No Fall Risk Category Calculator: 0 Patient Fall Risk Level: Low Fall Risk Patient at Risk for Falls Due to: History of fall(s), Impaired balance/gait, Impaired mobility Fall risk Follow up: Falls evaluation completed, Falls prevention discussed  Psychosocial Psychosocial Symptoms Reported: No symptoms reported Other Psychosocial Conditions: continues to be fatigued, did not go to ED but spoke with GI and given red flags for ED          06/06/2024    PHQ2-9 Depression Screening   Little interest or pleasure in doing things Not at all  Feeling down, depressed, or hopeless Not at all  PHQ-2 - Total Score 0  Trouble falling or staying asleep, or sleeping too much    Feeling tired or having little energy    Poor appetite or overeating     Feeling bad about yourself - or that you are a failure or have let yourself or your family down    Trouble concentrating on things, such as reading the newspaper or watching television    Moving or speaking so slowly that other people could have noticed.  Or the opposite - being so fidgety or restless that you have been moving around a lot more than usual    Thoughts that you would be better off dead, or hurting yourself in some way    PHQ2-9 Total Score    If you checked off any  problems, how difficult have these problems made it for you to do your work, take care of things at home, or get along with other people    Depression Interventions/Treatment      Today's Vitals   06/06/24 1414  Weight: 196 lb (88.9 kg)      Medications Reviewed Today     Reviewed by Devra Lands, RN (Registered Nurse) on 06/06/24 at 1413  Med List Status: <None>   Medication Order Taking? Sig Documenting Provider Last Dose Status Informant  acetaminophen  (TYLENOL ) 325 MG tablet [id number] Yes Take 325-650 mg by mouth every 6 (six) hours as needed for mild pain. [provider]  Active Self           Med Note EFRAIM, ALFREIDA CROME   Tue Sep 29, 2023  2:49 PM)    albuterol  (ACCUNEB ) 0.63 MG/3ML nebulizer solution [id number] Yes Take 1 ampule by nebulization every 6 (six) hours as needed for wheezing. [provider]  Active Self  apixaban  (ELIQUIS ) 5 MG TABS tablet [id number] Yes Take  1 tablet (5 mg total) by mouth 2 (two) times daily. RESUME SUNDAY pm Fernande Elspeth BROCKS, MD  Active Self  atropine  1 % ophthalmic solution [id number] Yes Place 1 drop into both eyes at bedtime. [provider]  Active Self  cefadroxil  (DURICEF) 500 MG capsule [id number] Yes Take 1 capsule by mouth twice daily Ravishankar, Donald, MD  Active   dapagliflozin  propanediol (FARXIGA ) 10 MG TABS tablet [id number]  TAKE 1 TABLET BY MOUTH ONCE DAILY BEFORE BREAKFAST  Patient not taking: Reported on 06/06/2024   Riddle, Suzann, NP  Active   diclofenac  Sodium (VOLTAREN ) 1 % topical gel 2 g [id number]   Bair, Kalpana, MD  Active   dofetilide  (TIKOSYN ) 250 MCG capsule [id number] Yes Take 1 capsule (250 mcg total) by mouth 2 (two) times daily. Riddle, Suzann, NP  Active   gabapentin  (NEURONTIN ) 600 MG tablet [id number] Yes Take 1 tablet (600 mg total) by mouth at bedtime. Bair, Luke, MD  Active   macitentan  (OPSUMIT ) 10 MG tablet [id number] Yes Take 1 tablet (10 mg total) by mouth daily. Rolan Ezra RAMAN, MD  Active Self  meclizine  (ANTIVERT ) 25 MG tablet [id number] Yes Take 1 tablet (25 mg total) by mouth 3 (three) times daily as needed for dizziness. [place] A, FNP  Active   Menthol , Topical Analgesic, (BIOFREEZE) 10 % CREA [id number] Yes Apply 1 Application topically daily as needed (apply to back for pain). [provider]  Active   midodrine  (PROAMATINE ) 5 MG tablet [id number] Yes TAKE 1 TABLET BY MOUTH THREE TIMES DAILY WITH MEALS Zenaida Morene PARAS, MD  Active   OXYGEN  [id number] Yes Inhale 4 L/min into the lungs continuous. [provider]  Active   potassium chloride  SA (KLOR-CON  M) 20 MEQ tablet [id number] Yes TAKE 2 TABLETS BY MOUTH IN THE MORNING 1 IN THE EVENING.  Patient taking differently: TAKE 2 TABLETS BY MOUTH IN THE MORNIng   McLean, Dalton S, MD  Active   prednisoLONE  acetate (PRED FORTE ) 1 % ophthalmic suspension [id number] Yes Place 1 drop into both eyes 2 (two) times daily. [provider]  Active   rosuvastatin  (CRESTOR ) 10 MG tablet [id number] Yes Take 1 tablet (10 mg total) by mouth daily. Rolan Ezra RAMAN, MD  Active   sildenafil  (REVATIO ) 20 MG tablet [id number] Yes Take 1 tablet (20 mg total) by mouth 3 (three) times daily. Rolan Ezra RAMAN, MD  Active   spironolactone  (ALDACTONE ) 25 MG tablet [id number] Yes Take 1 tablet by mouth daily. [provider]  Active Self  sucralfate  (CARAFATE ) 1 g tablet [id number] Yes Take 1 g by mouth 2 (two) times daily. [provider]  Active   torsemide  (DEMADEX ) 20 MG tablet [id number] Yes Take 3 tablets (60 mg total) by mouth daily. Rolan Ezra RAMAN, MD  Active   UPTRAVI  1600 MCG TABS [id number] Yes Take 1 tablet twice daily. Rolan Ezra RAMAN, MD  Active   Vitamin D , Ergocalciferol , (DRISDOL ) 1.25 MG (50000 UNIT) CAPS capsule [id number] Yes Take 1 capsule (50,000 Units total) by mouth every 7 (seven) days. Abbey Luke, MD  Active             Recommendation:   PCP  Follow-up  Follow Up Plan:   Telephone follow-up in 1 month  [name], MSN, RN [facility]  [facility], [facility] RN Care Manager Direct Dial: [phone] Fax: [phone]

## 2024-06-06 NOTE — Patient Instructions (Signed)
 Visit Information  Thank you for taking time to visit with me today. Please don't hesitate to contact me if I can be of assistance to you before our next scheduled appointment.  Your next care management appointment is by telephone on 07/06/2024 at 1:00 pm  Telephone follow-up in 1 month  Please call the care guide team at [phone] if you need to cancel, schedule, or reschedule an appointment.   Please call the Suicide and Crisis Lifeline: 988 call the [place]  National Suicide Prevention Lifeline: (361)292-4766 or TTY: 360-383-0080 TTY 252-394-5023) to talk to a trained counselor call 1-800-273-TALK (toll free, 24 hour hotline) go to [facility] [address] [place] [phone]) call 911 if you are experiencing a Mental Health or Behavioral Health Crisis or need someone to talk to.  [name], MSN, RN [facility], [facility] RN Care Manager Direct Dial: [phone] Fax: [phone]

## 2024-06-09 ENCOUNTER — Other Ambulatory Visit (HOSPITAL_COMMUNITY): Payer: Self-pay | Admitting: Cardiology

## 2024-06-09 ENCOUNTER — Ambulatory Visit: Payer: Self-pay | Admitting: Cardiology

## 2024-06-09 MED ORDER — MACITENTAN 10 MG PO TABS: 10.0000 mg | ORAL_TABLET | Freq: Every day | ORAL | 3 refills | Status: AC

## 2024-06-15 ENCOUNTER — Encounter: Payer: Self-pay | Admitting: Pharmacist

## 2024-06-15 NOTE — Progress Notes (Signed)
 Pharmacy Quality Measure Review  This patient is appearing on a report for being at risk of failing the adherence measure for diabetes medications this calendar year.   Medication: Farxiga  10 mg Last fill date: 02/09/24 for 90 day supply  Insurance report was not up to date. No action needed at this time.  Medication has been refilled as of 12/16 x90ds

## 2024-06-26 ENCOUNTER — Other Ambulatory Visit: Payer: Self-pay | Admitting: Cardiology

## 2024-07-04 ENCOUNTER — Ambulatory Visit

## 2024-07-04 DIAGNOSIS — I4819 Other persistent atrial fibrillation: Secondary | ICD-10-CM

## 2024-07-05 ENCOUNTER — Telehealth: Payer: Self-pay | Admitting: Family

## 2024-07-05 LAB — CUP PACEART REMOTE DEVICE CHECK
Battery Remaining Longevity: 131 mo
Battery Voltage: 3.03 V
Brady Statistic AP VP Percent: 94.85 %
Brady Statistic AP VS Percent: 0.07 %
Brady Statistic AS VP Percent: 0.98 %
Brady Statistic AS VS Percent: 4.1 %
Brady Statistic RA Percent Paced: 95.27 %
Brady Statistic RV Percent Paced: 95.82 %
Date Time Interrogation Session: 20260113110844
Implantable Lead Connection Status: 753985
Implantable Lead Connection Status: 753985
Implantable Lead Implant Date: 20150720
Implantable Lead Implant Date: 20150720
Implantable Lead Location: 753859
Implantable Lead Location: 753860
Implantable Lead Model: 5076
Implantable Lead Model: 5076
Implantable Pulse Generator Implant Date: 20241009
Lead Channel Impedance Value: 285 Ohm
Lead Channel Impedance Value: 323 Ohm
Lead Channel Impedance Value: 475 Ohm
Lead Channel Impedance Value: 513 Ohm
Lead Channel Pacing Threshold Amplitude: 0.5 V
Lead Channel Pacing Threshold Pulse Width: 0.4 ms
Lead Channel Sensing Intrinsic Amplitude: 1.5 mV
Lead Channel Sensing Intrinsic Amplitude: 1.5 mV
Lead Channel Sensing Intrinsic Amplitude: 6.375 mV
Lead Channel Sensing Intrinsic Amplitude: 6.375 mV
Lead Channel Setting Pacing Amplitude: 2 V
Lead Channel Setting Pacing Amplitude: 2 V
Lead Channel Setting Pacing Pulse Width: 0.4 ms
Lead Channel Setting Sensing Sensitivity: 0.9 mV
Zone Setting Status: 755011
Zone Setting Status: 755011

## 2024-07-05 NOTE — Progress Notes (Unsigned)
 "     PCP:  Abbey Bruckner, MD  Cardiologist:  Deatrice Cage, MD HF Cardiologist: Dr. Rolan  Chief Complaint: shortness of breath    HPI:   Debra Saunders is a 79 y.o. female who has a history of chronic diastolic heart failure, nonobstructive CAD, atrial fibrillation on eliquis , bradycardia s/p PPM [facility]), chronic respiratory failure on home O2, COPD, PAH, DM2, HTN, CKD (required HD in past), DVT s/p IVC filter 2004, HL, traumatic subarachnoid hemorrhage while on xarelto  2014, CVA 2014, OSA, and chronic lymphedema.   Admitted 10/24-10/29/19 with acute on chronic diastolic HF. HF team consulted with concerns for low output. PICC was placed and milrinone  to support RV function was started along with IV Lasix  to facilitate diuresis. RHC done to evaluate PAH (see below). V/Q scan negative. High res CT showed pulmonary fibrosis. Pulmonary consult and recommended stopping amiodarone . She was transitioned to torsemide  60 mg BID. DC weight: 226 lbs.    She has been on selexipag  for pulmonary hypertension in addition to Opsumit  and Revatio .      Echo in 3/21 showed EF 60-65%, normal RV, PASP 50 mmHg, moderate-severe MR.   Had RHC 08/30/2020- low/normal filling pressures, mild pumonary htn, and preserved cardiac. output.   Admitted to [facility] 1/23 with endocarditis and PNA, requiring pressors. BCx positive for Streptococcus group G.  All her PAH meds were held. TEE showed a large vegetation on the pacer wire in the right atrium above the tricuspid valve (1 x 1.5 cm).  No vegetations noted on the valves.  EF was 55% and there was note of mild to moderate mitral regurgitation. RV ok. She was transferred to [facility] for pacemaker extraction. EP felt she was not a candidate due to multiple comorbidities. ID consulted and planned for 6 weeks of IV abx. She developed Afib with RVR and started on amio. She underwent successful TEE-guided DCCV 07/14/21. Hospitalization complicated by anemia, hypotension,  and AKI.  Midodrine  started during admission. She was discharged to SNF, weight 198 lbs.  Admitted 3/23 with lightheadedness. Treated for sinus infection, HF meds held due to othostasis. Midodrine  increased to 10 mg tid. HH PT arranged.  Repeat echo in 3/23 showed EF 60-65%, mild LVH, normal RV, IVC normal, no definite vegetation noted.   Follow up 8/23, continued with dyspnea, despite euvolemia. Amiodarone  stopped, in case this was causing her symptoms, and arranged for RHC. RHC showed showed normal filling and PA pressures. Felt symptoms were pulmonary-driven.  She was admitted in 1/25 with chest pain. She was in atrial fibrillation. She was diuresed, RHC/LHC was done showing minimal CAD and moderate pulmonary hypertension with normal filling pressures after diuresis.   She was started on Tikosyn  in 4/25 then cardioverted to NSR.    Echo (7/25): EF 55-60%, normal RV, moderate TR  Today she returns, with her daughter, for a HF follow up with her daughter with a chief complaint of shortness of breath. Has associated fatigue, palpitations, frequent dizziness even when laying down, chronic edema in left leg. Chronic difficulty falling asleep. She says that she had run out of potassium for 1-2 weeks & had resumed taking it 2 weeks prior to most recent labs being checked on 04/29/24. Confirms that she's current taking 60meq potassium daily in divided dose.   ECG (11/25): AV-dual paced, QTc 499 (personally reviewed)  [employer] device: unable to interrogate today  6 minute walk (1/20): 98 m 6 minute walk (7/20): 213 m 6 minute walk (3/22): 198 m  6 minute walk (10/22): 229 m   Labs (6/24): K 3.6, creatinine 0.93 Labs (8/24): K 4.4, creatinine 0.97 Labs (1/25): K 3.9, creatinine 0.68, BNP 336 Labs (5/25): K 4.7, creatinine 1.07 Labs (6/25: LDL 41 Labs (7/25): K 4.5, creatinine 1.29, Hg 9.9 Labs (11/25): K 2.8, creatinine 1.00, Hg 9.8   PMH: 1. Lymphedema: Left lower leg (chronic).  2.  COPD 3. Pulmonary fibrosis/ILD: On home oxygen .  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  - PFTs (2017) with FEV1 64%, FEV1/FVC ratio 106%, TLC 49%, DLCO 39% (restrictive).  - CT chest high resolution (10/19): Pulmonary parenchymal pattern of fibrosis.  - CT chest (4/22): Chronic ILD 4. Atrial fibrillation: Paroxysmal.  She was previously off amiodarone  due to lung disease.  - 2/19 DCCV.  - 1/23 DCCV - 4/25 started Tikosyn  and cardioverted to NSR.  5. H/o DVT s/p IVC filter in 2004.  6. CAD: 4/19 coronary angiography with minimal coronary disease.  - LHC (1/25): Minimal coronary disease.  7. Bradycardia: [employer] PPM.  8. H/o traumatic SAH 2014.  9. CKD: Stage 3.  10. H/o CVA 11. OSA: CPAP 12. HTN 13. Type II diabetes 14. Pulmonary hypertension:  - PFTs 2017 with severe restriction.  - Echo (10/19): EF 60-65%, PASP 52 mmHg - RHC (4/19): Severe PAH with PVR 7.2 WU.  - RHC (10/19): mean RA 6, PA 56/17 mean 33, PCWP mean 12, CI 3.23, PVR 3.4 WU.  - RHC (3/22): mean 3, RV 30/1, PA 33/11, mean 20, PCWP mean 6, Oxygen  saturations: PA 69% AO 100%, Cardiac Output (Fick) 6.18, Cardiac Index (Fick) 3.36, and PVR 2.3 WU - V/Q scan (10/19): No chronic PE.  - Anti-SCL 70 and ANA negative.  ACE normal.  RF slightly elevated at 15.  - Echo (3/21): EF 60-65%, normal RV, PASP 50 mmHg, moderate-severe MR. - TEE (1/23): EF 60-65%, mild RV dilation with normal RV systolic function, MR mild-moderate, moderate TR with vegetation on TV and pacemaker lead - Echo (1/23): EF 60-65%, normal RV, RVSP 51.5 mmHg, mild-moderate MR, moderate TR - Echo (3/23): EF 60-65%, mild LVH, normal RV, IVC normal, no definite vegetation noted.  - RHC (8/23): RA 3, PA 29/10 (mean 16), PCWP 12, CO/CI (Fick) 6.18/3.37 - RHC (1/25): mean RA 5, PA 54/20 mean 29, mean PCWP 13, CI 2.36, PVR 3.7 WU.  - Echo (7/25): EF 55-60%, normal RV, moderate TR 15. 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  16. Mitral regurgitation:  moderate to severe on 3/22 echo.  17. Zio patch (3/22): 9.3% PACs, no AF, 2.9% PVCs 18. 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  Deemed too high risk to remove device so have tried to treat through.  19. Fe deficiency anemia 20. B12 deficiency  FH: mother with CHF  Current Outpatient Medications  Medication Sig Dispense Refill   acetaminophen  (TYLENOL ) 325 MG tablet Take 325-650 mg by mouth every 6 (six) hours as needed for mild pain.     albuterol  (ACCUNEB ) 0.63 MG/3ML nebulizer solution Take 1 ampule by nebulization every 6 (six) hours as needed for wheezing.     apixaban  (ELIQUIS ) 5 MG TABS tablet Take 1 tablet (5 mg total) by mouth 2 (two) times daily. RESUME SUNDAY pm 180 tablet 3   atropine  1 % ophthalmic solution Place 1 drop into both eyes at bedtime.     cefadroxil  (DURICEF) 500 MG capsule Take 1 capsule by mouth twice daily 60 capsule 6   dapagliflozin  propanediol (FARXIGA ) 10 MG TABS  tablet Take 1 tablet (10 mg total) by mouth daily before breakfast. 90 tablet 2   dofetilide  (TIKOSYN ) 250 MCG capsule Take 1 capsule (250 mcg total) by mouth 2 (two) times daily. 180 capsule 2   gabapentin  (NEURONTIN ) 600 MG tablet Take 1 tablet (600 mg total) by mouth at bedtime. 90 tablet 1   macitentan  (OPSUMIT ) 10 MG tablet Take 1 tablet (10 mg total) by mouth daily. 90 tablet 3   meclizine  (ANTIVERT ) 25 MG tablet Take 1 tablet (25 mg total) by mouth 3 (three) times daily as needed for dizziness. 30 tablet 1   Menthol , Topical Analgesic, (BIOFREEZE) 10 % CREA Apply 1 Application topically daily as needed (apply to back for pain).     midodrine  (PROAMATINE ) 5 MG tablet TAKE 1 TABLET BY MOUTH THREE TIMES DAILY WITH MEALS 90 tablet 3   OXYGEN  Inhale 4 L/min into the lungs continuous.     potassium chloride  SA (KLOR-CON  M) 20 MEQ tablet TAKE 2 TABLETS BY MOUTH IN THE MORNING 1 IN THE EVENING. (Patient taking differently: TAKE 2 TABLETS BY MOUTH IN THE MORNIng) 90 tablet 3    prednisoLONE  acetate (PRED FORTE ) 1 % ophthalmic suspension Place 1 drop into both eyes 2 (two) times daily.     rosuvastatin  (CRESTOR ) 10 MG tablet Take 1 tablet (10 mg total) by mouth daily. 90 tablet 3   sildenafil  (REVATIO ) 20 MG tablet Take 1 tablet (20 mg total) by mouth 3 (three) times daily. 270 tablet 3   spironolactone  (ALDACTONE ) 25 MG tablet Take 1 tablet by mouth daily.     sucralfate  (CARAFATE ) 1 g tablet Take 1 g by mouth 2 (two) times daily.     torsemide  (DEMADEX ) 20 MG tablet Take 3 tablets (60 mg total) by mouth daily. 200 tablet 3   UPTRAVI  1600 MCG TABS Take 1 tablet twice daily. 60 tablet 11   Vitamin D , Ergocalciferol , (DRISDOL ) 1.25 MG (50000 UNIT) CAPS capsule Take 1 capsule (50,000 Units total) by mouth every 7 (seven) days. 12 capsule 3   Current Facility-Administered Medications  Medication Dose Route Frequency Provider Last Rate Last Admin   diclofenac  Sodium (VOLTAREN ) 1 % topical gel 2 g  2 g Topical QID        Allergies:   Aspirin  and Other   Social History:  The patient  reports that she quit smoking about 34 years ago. Her smoking use included cigarettes. She started smoking about 36 years ago. She has a 0.6 pack-year smoking history. She has been exposed to tobacco smoke. She has never used smokeless tobacco. She reports that she does not drink alcohol and does not use drugs.   Family History:  The patient's family history includes Bone cancer in her sister; Breast cancer in her maternal aunt and sister; COPD in her mother; Heart disease in her mother; Hypertension in her father, mother, sister, and sister; Stroke in her son; Thyroid  disease in her mother, sister, and sister.   ROS:  Please see the history of present illness.   All other systems are personally reviewed and negative.   There were no vitals filed for this visit.  Wt Readings from Last 3 Encounters:  06/06/24 196 lb (88.9 kg)  06/01/24 196 lb 9.6 oz (89.2 kg)  05/03/24 193 lb 8 oz (87.8  kg)   Lab Results  Component Value Date   CREATININE 1.06 (H) 06/01/2024   CREATININE 1.00 04/29/2024   CREATININE 1.29 (H) 01/18/2024   Physical Exam  General:  Well appearing female in wheelchair.  Cor: No JVD. Regular rhythm, rate. 2/6 SEM RUSB.  Lungs: clear Abdomen: soft, nontender, nondistended. Extremities: chronic left leg lymphedema Neuro:. Affect pleasant  Assessment & Plan 1. Atrial fibrillation with tachy-brady syndrome:  Had been off amiodarone  since 2019 due to interstitial lung disease/pulmonary fibrosis. Amiodarone  restarted during 1/23 admission. s/p TEE-guided DCCV 1/23, amiodarone  later stopped again.  Tikosyn  begun 4/25 and she was cardioverted back to NSR.  She is in NSR today. Paced QTc is acceptable on today's ECG.  - Continue Tikosyn  250 mcg bid.    - Continue Eliquis  5 mg bid.   - saw EP (08/25) 2. 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 Echo in 3/23 showed EF 60-65%, mild LVH, normal RV, IVC normal, no definite vegetation noted. She has been on midodrine  for chronic hypotension but BP has been higher recently.  Repeat RHC (8/23) showed normal filling and PA pressures. RHC in 1/25 showed PA 54/20 with PVR 3.7 WU.  Echo (7/25): EF 55-60%, normal RV, moderate TR. NYHA class II-III, euvolemic today - Continue torsemide  60 mg daily/ 60meq potassium daily. K+ level 04/29/24 was 2.8. Renal function stable. Will recheck K+ level today - Continue spironolactone  25 mg daily.  - Continue Farxiga  10 mg daily.  3. Pulmonary hypertension: Based on 4/19 cath, she had severe PAH with PVR 7.2 WU. Chronic hypoxemic respiratory failure, on home oxygen .  She has a history of remote DVT (2004) but V/Q scan negative for evidence of chronic PE.  High resolution CT chest in 10/19 showed pulmonary fibrosis, and PFTs in 2017  showed severe restriction.  Serological workup for rheumatological diseases was unremarkable.  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 Amiodarone  was stopped in past given lung parenchymal disease. 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 RHC (8/23) showed normal filling and PA pressures. RHC in 1/25 showed PA 54/20 with PVR 3.7 WU. - Continue macitentan  10 daily.  - Continue Selexipag  1600 bid  - Continue sildenafil  20 mg tid.  - Continue home oxygen  at 4L and CPAP.  4. Endocarditis: in 1/23.  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  Group G strep bacteremia.  Septic emboli to lungs. Seen by EP, thought to be too high risk for device extraction. Unfortunately she will be at high risk for complications going forward.  - She is now on lifetime suppression with cefadroxil .  5. H/o DVT: Anticoagulated. Saw vascular 10/25 6. Left leg lymphedema: Chronic, unchanged.  7. OSA: CPAP.   8. Orthostatic hypotension: She was unable to decrease midodrine , will continue.   9. Bradycardia: has [employer].    Return in 3 months, sooner if needed.   I spent 38 minutes reviewing records, interviewing/ examing patient and managing plan/ orders.   Ellouise DELENA Class, FNP  07/05/2024 "

## 2024-07-05 NOTE — Telephone Encounter (Signed)
 Called to confirm/remind patient of their appointment [facility] on 07/06/[facility].   Appointment:   [x] Confirmed  [] Left mess   [] No answer/No voice mail  [] VM Full/unable to leave message  [] Phone not in service  Patient reminded to bring all medications and/or complete list.  Confirmed patient has transportation. Gave directions, instructed to utilize valet parking.

## 2024-07-06 ENCOUNTER — Encounter: Payer: Self-pay | Admitting: Family

## 2024-07-06 ENCOUNTER — Ambulatory Visit: Attending: Family | Admitting: Family

## 2024-07-06 ENCOUNTER — Telehealth: Payer: Self-pay

## 2024-07-06 VITALS — BP 114/62 | HR 72 | Wt 193.0 lb

## 2024-07-06 DIAGNOSIS — E785 Hyperlipidemia, unspecified: Secondary | ICD-10-CM | POA: Insufficient documentation

## 2024-07-06 DIAGNOSIS — I081 Rheumatic disorders of both mitral and tricuspid valves: Secondary | ICD-10-CM | POA: Insufficient documentation

## 2024-07-06 DIAGNOSIS — R5383 Other fatigue: Secondary | ICD-10-CM | POA: Diagnosis present

## 2024-07-06 DIAGNOSIS — R001 Bradycardia, unspecified: Secondary | ICD-10-CM

## 2024-07-06 DIAGNOSIS — I890 Lymphedema, not elsewhere classified: Secondary | ICD-10-CM | POA: Insufficient documentation

## 2024-07-06 DIAGNOSIS — Z87891 Personal history of nicotine dependence: Secondary | ICD-10-CM | POA: Insufficient documentation

## 2024-07-06 DIAGNOSIS — J849 Interstitial pulmonary disease, unspecified: Secondary | ICD-10-CM | POA: Insufficient documentation

## 2024-07-06 DIAGNOSIS — Z7901 Long term (current) use of anticoagulants: Secondary | ICD-10-CM | POA: Insufficient documentation

## 2024-07-06 DIAGNOSIS — Z7984 Long term (current) use of oral hypoglycemic drugs: Secondary | ICD-10-CM | POA: Diagnosis not present

## 2024-07-06 DIAGNOSIS — I4891 Unspecified atrial fibrillation: Secondary | ICD-10-CM | POA: Diagnosis not present

## 2024-07-06 DIAGNOSIS — Z792 Long term (current) use of antibiotics: Secondary | ICD-10-CM | POA: Insufficient documentation

## 2024-07-06 DIAGNOSIS — I5032 Chronic diastolic (congestive) heart failure: Secondary | ICD-10-CM | POA: Diagnosis not present

## 2024-07-06 DIAGNOSIS — G4733 Obstructive sleep apnea (adult) (pediatric): Secondary | ICD-10-CM | POA: Diagnosis not present

## 2024-07-06 DIAGNOSIS — I2510 Atherosclerotic heart disease of native coronary artery without angina pectoris: Secondary | ICD-10-CM | POA: Insufficient documentation

## 2024-07-06 DIAGNOSIS — I4819 Other persistent atrial fibrillation: Secondary | ICD-10-CM | POA: Diagnosis not present

## 2024-07-06 DIAGNOSIS — Z8673 Personal history of transient ischemic attack (TIA), and cerebral infarction without residual deficits: Secondary | ICD-10-CM | POA: Insufficient documentation

## 2024-07-06 DIAGNOSIS — E1122 Type 2 diabetes mellitus with diabetic chronic kidney disease: Secondary | ICD-10-CM | POA: Insufficient documentation

## 2024-07-06 DIAGNOSIS — I951 Orthostatic hypotension: Secondary | ICD-10-CM | POA: Insufficient documentation

## 2024-07-06 DIAGNOSIS — I330 Acute and subacute infective endocarditis: Secondary | ICD-10-CM | POA: Insufficient documentation

## 2024-07-06 DIAGNOSIS — N183 Chronic kidney disease, stage 3 unspecified: Secondary | ICD-10-CM | POA: Diagnosis not present

## 2024-07-06 DIAGNOSIS — I272 Pulmonary hypertension, unspecified: Secondary | ICD-10-CM | POA: Diagnosis not present

## 2024-07-06 DIAGNOSIS — I495 Sick sinus syndrome: Secondary | ICD-10-CM | POA: Insufficient documentation

## 2024-07-06 DIAGNOSIS — Z9981 Dependence on supplemental oxygen: Secondary | ICD-10-CM | POA: Insufficient documentation

## 2024-07-06 DIAGNOSIS — I82402 Acute embolism and thrombosis of unspecified deep veins of left lower extremity: Secondary | ICD-10-CM

## 2024-07-06 DIAGNOSIS — I130 Hypertensive heart and chronic kidney disease with heart failure and stage 1 through stage 4 chronic kidney disease, or unspecified chronic kidney disease: Secondary | ICD-10-CM | POA: Diagnosis not present

## 2024-07-06 DIAGNOSIS — Z950 Presence of cardiac pacemaker: Secondary | ICD-10-CM | POA: Insufficient documentation

## 2024-07-06 DIAGNOSIS — J449 Chronic obstructive pulmonary disease, unspecified: Secondary | ICD-10-CM | POA: Diagnosis not present

## 2024-07-06 DIAGNOSIS — B954 Other streptococcus as the cause of diseases classified elsewhere: Secondary | ICD-10-CM | POA: Insufficient documentation

## 2024-07-06 DIAGNOSIS — I38 Endocarditis, valve unspecified: Secondary | ICD-10-CM | POA: Diagnosis not present

## 2024-07-06 DIAGNOSIS — Z86718 Personal history of other venous thrombosis and embolism: Secondary | ICD-10-CM | POA: Insufficient documentation

## 2024-07-06 DIAGNOSIS — Z79899 Other long term (current) drug therapy: Secondary | ICD-10-CM | POA: Diagnosis not present

## 2024-07-06 DIAGNOSIS — J9610 Chronic respiratory failure, unspecified whether with hypoxia or hypercapnia: Secondary | ICD-10-CM | POA: Insufficient documentation

## 2024-07-06 LAB — BASIC METABOLIC PANEL WITH GFR
BUN/Creatinine Ratio: 28 (ref 12–28)
BUN: 40 mg/dL — ABNORMAL HIGH (ref 8–27)
CO2: 19 mmol/L — ABNORMAL LOW (ref 20–29)
Calcium: 9.2 mg/dL (ref 8.7–10.3)
Chloride: 103 mmol/L (ref 96–106)
Creatinine, Ser: 1.45 mg/dL — ABNORMAL HIGH (ref 0.57–1.00)
Glucose: 114 mg/dL — ABNORMAL HIGH (ref 70–99)
Potassium: 4.7 mmol/L (ref 3.5–5.2)
Sodium: 139 mmol/L (ref 134–144)
eGFR: 36 mL/min/1.73 — ABNORMAL LOW

## 2024-07-06 NOTE — Progress Notes (Signed)
 Remote PPM Transmission

## 2024-07-06 NOTE — Patient Outreach (Signed)
 RNCM - patient on way to another appointment, daughter requesting call back later this week to reschedule.

## 2024-07-07 ENCOUNTER — Ambulatory Visit: Payer: Self-pay | Admitting: Family

## 2024-07-07 ENCOUNTER — Other Ambulatory Visit: Payer: Self-pay | Admitting: Family

## 2024-07-07 DIAGNOSIS — I5032 Chronic diastolic (congestive) heart failure: Secondary | ICD-10-CM

## 2024-07-07 MED ORDER — POTASSIUM CHLORIDE CRYS ER 20 MEQ PO TBCR: EXTENDED_RELEASE_TABLET | ORAL | Status: AC

## 2024-07-07 MED ORDER — TORSEMIDE 20 MG PO TABS: 40.0000 mg | ORAL_TABLET | Freq: Every day | ORAL | Status: DC

## 2024-07-07 NOTE — Telephone Encounter (Signed)
 Spoke to daughter Dedra about pt lab work and medication changes. A[facility]. No further questions at this time.

## 2024-07-10 ENCOUNTER — Ambulatory Visit: Payer: Self-pay | Admitting: Cardiology

## 2024-07-12 ENCOUNTER — Other Ambulatory Visit: Payer: Self-pay | Admitting: Cardiology

## 2024-07-12 ENCOUNTER — Ambulatory Visit

## 2024-07-12 ENCOUNTER — Telehealth: Payer: Self-pay | Admitting: *Deleted

## 2024-07-12 ENCOUNTER — Other Ambulatory Visit (HOSPITAL_COMMUNITY)
Admission: RE | Admit: 2024-07-12 | Discharge: 2024-07-12 | Disposition: A | Discharge status: Home / Self Care | Source: Ambulatory Visit

## 2024-07-12 VITALS — BP 108/70 | HR 80 | Temp 98.4°F | Ht 60.0 in | Wt 191.8 lb

## 2024-07-12 DIAGNOSIS — N898 Other specified noninflammatory disorders of vagina: Secondary | ICD-10-CM | POA: Insufficient documentation

## 2024-07-12 DIAGNOSIS — N951 Menopausal and female climacteric states: Secondary | ICD-10-CM

## 2024-07-12 DIAGNOSIS — R3915 Urgency of urination: Secondary | ICD-10-CM

## 2024-07-12 DIAGNOSIS — I4819 Other persistent atrial fibrillation: Secondary | ICD-10-CM | POA: Diagnosis not present

## 2024-07-12 DIAGNOSIS — I5032 Chronic diastolic (congestive) heart failure: Secondary | ICD-10-CM

## 2024-07-12 DIAGNOSIS — Z95811 Presence of heart assist device: Secondary | ICD-10-CM

## 2024-07-12 DIAGNOSIS — J849 Interstitial pulmonary disease, unspecified: Secondary | ICD-10-CM

## 2024-07-12 DIAGNOSIS — I272 Pulmonary hypertension, unspecified: Secondary | ICD-10-CM | POA: Diagnosis not present

## 2024-07-12 DIAGNOSIS — G8929 Other chronic pain: Secondary | ICD-10-CM | POA: Diagnosis not present

## 2024-07-12 DIAGNOSIS — M545 Low back pain, unspecified: Secondary | ICD-10-CM | POA: Diagnosis not present

## 2024-07-12 LAB — URINALYSIS, ROUTINE W REFLEX MICROSCOPIC
Bilirubin Urine: NEGATIVE
Hgb urine dipstick: NEGATIVE
Ketones, ur: NEGATIVE
Leukocytes,Ua: NEGATIVE
Nitrite: NEGATIVE
RBC / HPF: NONE SEEN
Specific Gravity, Urine: 1.01 (ref 1.000–1.030)
Total Protein, Urine: NEGATIVE
Urine Glucose: 100 — AB
Urobilinogen, UA: 0.2 (ref 0.0–1.0)
pH: 6 (ref 5.0–8.0)

## 2024-07-12 MED ORDER — ESTRADIOL 0.01 % VA CREA: TOPICAL_CREAM | VAGINAL | 12 refills | Status: AC

## 2024-07-12 MED ORDER — ESTRADIOL 0.01 % VA CREA: TOPICAL_CREAM | VAGINAL | 3 refills | Status: DC

## 2024-07-12 MED ORDER — GABAPENTIN 300 MG PO CAPS: 300.0000 mg | ORAL_CAPSULE | Freq: Every evening | ORAL | 3 refills | Status: AC

## 2024-07-12 NOTE — Assessment & Plan Note (Addendum)
 Vaginal dryness noted.  Start Estrace  vaginal cream nightly for 14 days then 3 times a week after that.  Counseled patient that it may take up to 6 months for her to notice improvement in her symptoms.  No history of endometrial cancer, breast cancer.  Consider referral to gynecology if symptoms persist. Orders:   estradiol  (ESTRACE ) 0.01 % CREA vaginal cream; FOR 14 DAYS, then three times weekly thereafter

## 2024-07-12 NOTE — Assessment & Plan Note (Addendum)
 Chronic, taking gabapentin  600 mg nightly.  Patient endorses dizziness.  Due to concern for side effect reduce dose of gabapentin  from 600 mg to 300 mg nightly.  Refill sent to the pharmacy.  Orders:   gabapentin  (NEURONTIN ) 300 MG capsule; Take 1 capsule (300 mg total) by mouth at bedtime.

## 2024-07-12 NOTE — Assessment & Plan Note (Addendum)
 On 4 L oxygen  per day.  Referral to palliative care made today.  Continue follow-up with pulmonology. Orders:   Amb Referral to Palliative Care

## 2024-07-12 NOTE — Patient Instructions (Addendum)
-   Start vaginal estrogen cream:  EVERY NIGHT FOR 14 DAYS, then three times weekly thereafter. If any problem please let me know.   I am reducing dose of Gabapentin  from 600 mg to 300 mg to reduce risk of dizziness, fall.   I will let you know on swab result and urine result as well.   We also did Palliative care referral today.

## 2024-07-12 NOTE — Progress Notes (Signed)
 "  Established Patient Office Visit   Subjective  Patient ID: Debra Saunders, female    DOB: Mar 22, 1946  Age: 79 y.o. MRN: [id number]  Chief Complaint  Patient presents with   Chronic Kidney Disease   Diabetes   Gastroesophageal Reflux   Vaginal Itching   Urinary Retention   Discussed the use of AI scribe software for clinical note transcription with the patient, who gave verbal consent to proceed.  HPI:  Patient presents with her daughter for evaluation of following concerns:   Vaginal dryness, irritation, pruritus:  Ongoing for few months.  She uses wet wipes.  Due to chronic conditions she is not always able to make it to bathroom in time.  Has tried over-the-counter vaginal miconazole intermittently without significant improvement in symptoms.  She does have a history of vaginal warts which was previously evaluated and biopsied by gynecologist.  She denies vaginal bleeding, discharge.   She has multiple cardiac and pulmonary conditions as listed below and follows up with cardiology closely:  Persistent atrial fibrillation: On Eliquis , Tikosyn   pulmonary HTN: On 4L/min oxygen , Sildenafil , Selexipag , Macitentan   Chronic diastolic CHF: On Torsemide , Spironolactone , Farxiga   Sinus bradycardia: Pacemaker  Orthostatic hypotension: On midodrine  ILD: following up with pulmonologist at [facility]  Endocarditis history with currently on Cefadroxil  500 mg twice a day. Has seen ID Dr. Fayette.   DVT left lower leg, lymphedema:  Sees vascular and is on apixaban  5 mg twice a day.  Symptomatic anemia: Seeing hematologist Dr. Rennie (last visit 04/29/24) Chronic iron  deficiency: Was previously on Venofer  which was held during last visit on 04/29/24, plans on repeating lab prior to infusion. 3 months follow up recommended. Etiology unknown (CKD vs GI, s/p capsule EGD in May 2024)  B12 deficiency: IM B12 every 3-4 months   She has a history of chronic back pain and is on gabapentin  600 mg.   But she has noted feeling dizzy.  She also has history of going to bed at same time.  She was recommended to start melatonin but she prefers not to take 1 for now.   She reports of intermittent GERD-like symptoms specially when she drinks cold drinks.  She has seen gastroenterologist at [facility] Dr. Maryruth for GI concerns and is on sucralfate  1 g twice a day to help with diarrhea.  No nausea, vomiting.  Symptoms seems to be significant with cold beverages only.   ROS As per HPI    Objective:     BP 108/70 (BP Location: Right Arm, Patient Position: Sitting)   Pulse 80   Temp 98.4 F (36.9 C) (Oral)   Ht 5' (1.524 m)   Wt 191 lb 12.8 oz (87 kg)   SpO2 97%   BMI 37.46 kg/m      06/06/2024    2:20 PM 05/17/2024    1:55 PM 04/29/2024    2:00 PM  Depression screen PHQ 2/9  Decreased Interest 0 0 0  Down, Depressed, Hopeless 0 0 0  PHQ - 2 Score 0 0 0      12/29/2023    2:25 PM 09/23/2023    2:59 PM 09/02/2023    1:59 PM 03/02/2023    3:55 PM  GAD 7 : Generalized Anxiety Score  Nervous, Anxious, on Edge 0  0  0  0   Control/stop worrying 0  0  0  0   Worry too much - different things 0  0  0  0   Trouble relaxing 0  0  0  0   Restless 0  0  0  0   Easily annoyed or irritable 0  0  0  0   Afraid - awful might happen 0  0  0  0   Total GAD 7 Score 0 0 0 0  Anxiety Difficulty Not difficult at all  Not difficult at all Not difficult at all     Data saved with a previous flowsheet row definition      06/06/2024    2:20 PM 05/17/2024    1:55 PM 04/29/2024    2:00 PM  Depression screen PHQ 2/9  Decreased Interest 0 0 0  Down, Depressed, Hopeless 0 0 0  PHQ - 2 Score 0 0 0      12/29/2023    2:25 PM 09/23/2023    2:59 PM 09/02/2023    1:59 PM 03/02/2023    3:55 PM  GAD 7 : Generalized Anxiety Score  Nervous, Anxious, on Edge 0  0  0  0   Control/stop worrying 0  0  0  0   Worry too much - different things 0  0  0  0   Trouble relaxing 0  0  0  0   Restless 0  0  0  0   Easily  annoyed or irritable 0  0  0  0   Afraid - awful might happen 0  0  0  0   Total GAD 7 Score 0 0 0 0  Anxiety Difficulty Not difficult at all  Not difficult at all Not difficult at all     Data saved with a previous flowsheet row definition   SDOH Screenings   Food Insecurity: No Food Insecurity (03/15/2024)  Housing: Unknown (03/15/2024)  Transportation Needs: No Transportation Needs (04/19/2024)  Utilities: Not At Risk (03/15/2024)  Alcohol Screen: Low Risk (03/15/2024)  Depression (PHQ2-9): Low Risk (06/06/2024)  Recent Concern: Depression (PHQ2-9) - Medium Risk (03/15/2024)  Financial Resource Strain: Low Risk (03/15/2024)  Physical Activity: Inactive (03/15/2024)  Social Connections: Socially Isolated (03/15/2024)  Stress: No Stress Concern Present (03/15/2024)  Tobacco Use: Medium Risk (07/12/2024)  Health Literacy: Adequate Health Literacy (03/15/2024)     Physical Exam Constitutional:      Appearance: She is obese.  HENT:     Head: Normocephalic and atraumatic.     Mouth/Throat:     Mouth: Mucous membranes are moist.  Cardiovascular:     Rate and Rhythm: Normal rate.  Pulmonary:     Effort: Pulmonary effort is normal.     Comments: On 4l/min oxygen  Abdominal:     General: Bowel sounds are normal.     Palpations: Abdomen is soft.     Tenderness: There is no guarding.  Genitourinary:    Comments: External genitalia with vaginal warts noted. No vaginal bleeding or discharge observed. On inspection, vaginal skin appears dry and pale. No active erythema, or signs of acute infection noted.  Musculoskeletal:     Right lower leg: No edema.     Left lower leg: Edema (chronic lle edema) present.  Neurological:     Mental Status: She is alert and oriented to person, place, and time.     Gait: Gait abnormal (using a walker for ambulation).  Psychiatric:        Mood and Affect: Mood normal.        No results found for any visits on 07/12/24.  The ASCVD Risk score (Arnett  DK,  et al., 2019) failed to calculate for the following reasons:   The 2019 ASCVD risk score is only valid for ages 73 to 72   Risk score cannot be calculated because patient has a medical history suggesting prior/existing ASCVD   * - Cholesterol units were assumed     Assessment & Plan:  In regards to intermittent, transient sensation of cold beverage passing down her GI tract I recommend reducing consumption of cold beverage to see if this helps improvement in her symptoms.  I also recommend follow-up with gastroenterologist if symptoms persist. Assessment & Plan Presence of heart assist device [facility]) None history of bradycardia managed with pacemaker in place.    Chronic left-sided low back pain without sciatica Chronic, taking gabapentin  600 mg nightly.  Patient endorses dizziness.  Due to concern for side effect reduce dose of gabapentin  from 600 mg to 300 mg nightly.  Refill sent to the pharmacy.  Orders:   gabapentin  (NEURONTIN ) 300 MG capsule; Take 1 capsule (300 mg total) by mouth at bedtime.  Vaginal irritation Vaginal wart, vaginal dryness noted.  Vaginal swab obtained to rule out BV, vaginal yeast infection.  She has history of endocarditis and is on chronic cefadroxil  twice a day.   Vaginal hygiene discussed. Orders:   Cervicovaginal ancillary only( [place])   Urinalysis, Routine w reflex microscopic   Urine Culture  Urinary urgency Check UA and urine culture to rule out urinary tract infection. Orders:   Urinalysis, Routine w reflex microscopic   Urine Culture  Vaginal dryness, menopausal Vaginal dryness noted.  Start Estrace  vaginal cream nightly for 14 days then 3 times a week after that.  Counseled patient that it may take up to 6 months for her to notice improvement in her symptoms.  No history of endometrial cancer, breast cancer.  Consider referral to gynecology if symptoms persist. Orders:   estradiol  (ESTRACE ) 0.01 % CREA vaginal cream; FOR 14 DAYS, then  three times weekly thereafter  Interstitial lung disease (HCC) On 4 L oxygen  per day.  Referral to palliative care made today.  Continue follow-up with pulmonology. Orders:   Amb Referral to Palliative Care  Pulmonary HTN [facility]) Continue follow-up with pulmonology and cardiology.  Palliative care referral made. Orders:   Amb Referral to Palliative Care  Persistent atrial fibrillation [facility]) Continue follow-up with cardiology.  Palliative care referral made today. Orders:   Amb Referral to Palliative Care  Chronic diastolic CHF (congestive heart failure) (HCC) Continue follow-up with cardiology.  Palliative care referral made today.      I personally spent a total of 45 minutes in the care of the patient today including preparing to see the patient, getting/reviewing separately obtained history, performing a medically appropriate exam/evaluation, counseling and educating, placing orders, referring and communicating with other health care professionals, documenting clinical information in the EHR, independently interpreting results, and communicating results.  Return in about 4 months (around 11/09/2024) for chronic f/u.   Luke Shade, MD "

## 2024-07-12 NOTE — Assessment & Plan Note (Addendum)
 Continue follow-up with cardiology.  Palliative care referral made today. Orders:   Amb Referral to Palliative Care

## 2024-07-12 NOTE — Patient Outreach (Signed)
 RNCM contacted patient to reschedule missed appointment, declined to reschedule. Aware can request new referral as needed. PCP notified.

## 2024-07-12 NOTE — Assessment & Plan Note (Addendum)
 Continue follow-up with cardiology.  Palliative care referral made today.

## 2024-07-12 NOTE — Telephone Encounter (Signed)
 1. Vaginal dryness, menopausal (Primary) - Please reach out to pharmacy to confirm following prescription thank you: - Insert 1 g vaginally every night for 2 weeks, then 1 g three times weekly after that.  - estradiol  (ESTRACE ) 0.01 % CREA vaginal cream; Insert 1 g vaginally every night for 2 weeks, then 1 g three times weekly after that  Dispense: 42.5 g; Refill: 12  Luke Shade, MD

## 2024-07-12 NOTE — Telephone Encounter (Signed)
 Spoke with Pharmacy Technician Jazmine to follow up with new Rx sent over from Dr Abbey. Jazmine verbalizes she did receive it and thanks the provider.

## 2024-07-12 NOTE — Assessment & Plan Note (Deleted)
 [name]

## 2024-07-12 NOTE — Assessment & Plan Note (Addendum)
 Continue follow-up with pulmonology and cardiology.  Palliative care referral made. Orders:   Amb Referral to Palliative Care

## 2024-07-12 NOTE — Assessment & Plan Note (Addendum)
 Check UA and urine culture to rule out urinary tract infection. Orders:   Urinalysis, Routine w reflex microscopic   Urine Culture

## 2024-07-12 NOTE — Assessment & Plan Note (Addendum)
 Vaginal wart, vaginal dryness noted.  Vaginal swab obtained to rule out BV, vaginal yeast infection.  She has history of endocarditis and is on chronic cefadroxil  twice a day.   Vaginal hygiene discussed. Orders:   Cervicovaginal ancillary only( )   Urinalysis, Routine w reflex microscopic   Urine Culture

## 2024-07-12 NOTE — Telephone Encounter (Signed)
 Copied from CRM #[id number]. Topic: Clinical - Medication Question >> Jul 12, 2024 12:49 PM Robinson H wrote: Reason for CRM: Jazmine with [facility] calling for clarity on patients estradiol  (ESTRACE ) 0.01 % CREA vaginal cream, states instructions are clear. Needs new prescription with clear instructions can call to validate with new directions.  Jazmine [phone]

## 2024-07-12 NOTE — Assessment & Plan Note (Addendum)
 None history of bradycardia managed with pacemaker in place.

## 2024-07-13 ENCOUNTER — Ambulatory Visit: Payer: Self-pay

## 2024-07-13 DIAGNOSIS — B3731 Acute candidiasis of vulva and vagina: Secondary | ICD-10-CM | POA: Insufficient documentation

## 2024-07-13 LAB — CERVICOVAGINAL ANCILLARY ONLY
Bacterial Vaginitis (gardnerella): NEGATIVE
Candida Glabrata: NEGATIVE
Candida Vaginitis: POSITIVE — AB
Comment: NEGATIVE
Comment: NEGATIVE
Comment: NEGATIVE

## 2024-07-13 MED ORDER — CLOTRIMAZOLE 2 % VA CREA: 1.0000 | TOPICAL_CREAM | Freq: Every day | VAGINAL | 0 refills | Status: AC

## 2024-07-13 NOTE — Progress Notes (Signed)
 1. Candida vaginitis (Primary) - clotrimazole  (GYNE-LOTRIMIN  3) 2 % vaginal cream; Place 1 Applicatorful vaginally at bedtime for 7 days.  Dispense: 21 g; Refill: 0  Luke Shade, MD

## 2024-07-14 LAB — URINE CULTURE
MICRO NUMBER:: 17490878
SPECIMEN QUALITY:: ADEQUATE

## 2024-07-14 NOTE — Progress Notes (Signed)
 Please let the patient/DPR know urine culture came back positive for bacteria called Enterobacter cloacae complex. Due to patient's medical history including kidney disease, being on Tikosyn  and antibiotic Cefadroxil  for endocarditis I do not recommend adding another antibiotic at this time. I recommend following up with infectious disease Dr. Fayette (patient is already established with her) to determine if treatment with a different antibiotic is needed.   Luke Shade, MD

## 2024-07-27 ENCOUNTER — Inpatient Hospital Stay

## 2024-07-29 ENCOUNTER — Inpatient Hospital Stay

## 2024-07-29 ENCOUNTER — Inpatient Hospital Stay: Admitting: Internal Medicine

## 2024-08-12 ENCOUNTER — Inpatient Hospital Stay

## 2024-08-16 ENCOUNTER — Inpatient Hospital Stay

## 2024-08-16 ENCOUNTER — Inpatient Hospital Stay: Admitting: Internal Medicine

## 2024-10-03 ENCOUNTER — Encounter

## 2024-10-05 ENCOUNTER — Ambulatory Visit: Admitting: Cardiology

## 2024-10-10 ENCOUNTER — Ambulatory Visit: Admitting: Cardiology

## 2024-11-09 ENCOUNTER — Ambulatory Visit

## 2025-01-02 ENCOUNTER — Encounter

## 2025-03-20 ENCOUNTER — Ambulatory Visit
